# Patient Record
Sex: Male | Born: 1960 | Race: White | Hispanic: No | Marital: Married | Smoking: Former smoker
Health system: Southern US, Community
[De-identification: ages and names within clinical notes are randomized; demographics above are authoritative.]

## PROBLEM LIST (undated history)

## (undated) ENCOUNTER — Emergency Department: Discharge status: Absent without leave | Payer: Medicaid Other

## (undated) DIAGNOSIS — Z860100 Personal history of colon polyps, unspecified: Secondary | ICD-10-CM

## (undated) DIAGNOSIS — Z803 Family history of malignant neoplasm of breast: Secondary | ICD-10-CM

## (undated) DIAGNOSIS — K219 Gastro-esophageal reflux disease without esophagitis: Secondary | ICD-10-CM

## (undated) DIAGNOSIS — N23 Unspecified renal colic: Secondary | ICD-10-CM

## (undated) DIAGNOSIS — F429 Obsessive-compulsive disorder, unspecified: Secondary | ICD-10-CM

## (undated) DIAGNOSIS — M109 Gout, unspecified: Secondary | ICD-10-CM

## (undated) DIAGNOSIS — N200 Calculus of kidney: Secondary | ICD-10-CM

## (undated) DIAGNOSIS — F1010 Alcohol abuse, uncomplicated: Secondary | ICD-10-CM

## (undated) DIAGNOSIS — J45909 Unspecified asthma, uncomplicated: Secondary | ICD-10-CM

## (undated) DIAGNOSIS — I10 Essential (primary) hypertension: Secondary | ICD-10-CM

## (undated) DIAGNOSIS — F419 Anxiety disorder, unspecified: Secondary | ICD-10-CM

## (undated) DIAGNOSIS — Z1503 Genetic susceptibility to malignant neoplasm of prostate: Secondary | ICD-10-CM

## (undated) DIAGNOSIS — Z1501 Genetic susceptibility to malignant neoplasm of breast: Secondary | ICD-10-CM

## (undated) DIAGNOSIS — Z1509 Genetic susceptibility to other malignant neoplasm: Secondary | ICD-10-CM

## (undated) HISTORY — DX: Genetic susceptibility to other malignant neoplasm: Z15.09

## (undated) HISTORY — DX: Genetic susceptibility to malignant neoplasm of breast: Z15.03

## (undated) HISTORY — DX: Family history of malignant neoplasm of breast: Z80.3

## (undated) HISTORY — PX: COLONOSCOPY: SHX174

## (undated) HISTORY — PX: UPPER GI ENDOSCOPY: SHX6162

## (undated) HISTORY — DX: Genetic susceptibility to malignant neoplasm of breast: Z15.01

## (undated) HISTORY — DX: Personal history of colonic polyps: Z86.010

## (undated) HISTORY — DX: Personal history of colon polyps, unspecified: Z86.0100

## (undated) HISTORY — DX: Gastro-esophageal reflux disease without esophagitis: K21.9

---

## 2010-11-16 HISTORY — PX: CHOLECYSTECTOMY: SHX55

## 2012-06-02 ENCOUNTER — Emergency Department: Payer: Self-pay | Admitting: Emergency Medicine

## 2012-06-02 LAB — COMPREHENSIVE METABOLIC PANEL
Albumin: 4.2 g/dL (ref 3.4–5.0)
Alkaline Phosphatase: 85 U/L (ref 50–136)
Anion Gap: 12 (ref 7–16)
BUN: 15 mg/dL (ref 7–18)
Bilirubin,Total: 0.3 mg/dL (ref 0.2–1.0)
Calcium, Total: 9.1 mg/dL (ref 8.5–10.1)
Chloride: 108 mmol/L — ABNORMAL HIGH (ref 98–107)
Co2: 25 mmol/L (ref 21–32)
Creatinine: 1 mg/dL (ref 0.60–1.30)
EGFR (African American): >60
EGFR (Non-African Amer.): >60
Glucose: 129 mg/dL — ABNORMAL HIGH (ref 65–99)
Osmolality: 291 (ref 275–301)
Potassium: 3.2 mmol/L — ABNORMAL LOW (ref 3.5–5.1)
SGOT(AST): 13 U/L — ABNORMAL LOW (ref 15–37)
SGPT (ALT): 21 U/L
Sodium: 145 mmol/L (ref 136–145)
Total Protein: 6.8 g/dL (ref 6.4–8.2)

## 2012-06-02 LAB — URINALYSIS, COMPLETE
Bacteria: NONE SEEN
Bilirubin,UR: NEGATIVE
Blood: NEGATIVE
Glucose,UR: NEGATIVE mg/dL (ref 0–75)
Ketone: NEGATIVE
Leukocyte Esterase: NEGATIVE
Nitrite: NEGATIVE
Ph: 6 (ref 4.5–8.0)
Protein: NEGATIVE
RBC,UR: NONE SEEN /HPF (ref 0–5)
Specific Gravity: 1.005 (ref 1.003–1.030)
Squamous Epithelial: <1
WBC UR: 1 /HPF (ref 0–5)

## 2012-06-02 LAB — CBC
HCT: 41.1 % (ref 40.0–52.0)
HGB: 13.3 g/dL (ref 13.0–18.0)
MCH: 29.7 pg (ref 26.0–34.0)
MCHC: 32.3 g/dL (ref 32.0–36.0)
MCV: 92 fL (ref 80–100)
Platelet: 190 10*3/uL (ref 150–440)
RBC: 4.46 10*6/uL (ref 4.40–5.90)
RDW: 13.4 % (ref 11.5–14.5)
WBC: 7.8 10*3/uL (ref 3.8–10.6)

## 2012-06-02 LAB — TROPONIN I: Troponin-I: <0.02 ng/mL

## 2012-06-11 ENCOUNTER — Emergency Department: Payer: Self-pay | Admitting: Emergency Medicine

## 2012-06-24 ENCOUNTER — Emergency Department: Payer: Self-pay | Admitting: *Deleted

## 2012-06-24 LAB — CBC WITH DIFFERENTIAL/PLATELET
Basophil #: 0.1 10*3/uL (ref 0.0–0.1)
Basophil %: 0.7 %
Eosinophil #: 0.2 10*3/uL (ref 0.0–0.7)
Eosinophil %: 2.1 %
HCT: 43.6 % (ref 40.0–52.0)
HGB: 14.5 g/dL (ref 13.0–18.0)
Lymphocyte #: 2.3 10*3/uL (ref 1.0–3.6)
Lymphocyte %: 27 %
MCH: 30 pg (ref 26.0–34.0)
MCHC: 33.2 g/dL (ref 32.0–36.0)
MCV: 90 fL (ref 80–100)
Monocyte #: 1.1 x10 3/mm — ABNORMAL HIGH (ref 0.2–1.0)
Monocyte %: 12.7 %
Neutrophil #: 4.8 10*3/uL (ref 1.4–6.5)
Neutrophil %: 57.5 %
Platelet: 217 10*3/uL (ref 150–440)
RBC: 4.83 10*6/uL (ref 4.40–5.90)
RDW: 13.4 % (ref 11.5–14.5)
WBC: 8.4 10*3/uL (ref 3.8–10.6)

## 2012-06-24 LAB — COMPREHENSIVE METABOLIC PANEL
Albumin: 4.1 g/dL (ref 3.4–5.0)
Alkaline Phosphatase: 91 U/L (ref 50–136)
Anion Gap: 10 (ref 7–16)
BUN: 17 mg/dL (ref 7–18)
Bilirubin,Total: 0.4 mg/dL (ref 0.2–1.0)
Calcium, Total: 9 mg/dL (ref 8.5–10.1)
Chloride: 107 mmol/L (ref 98–107)
Co2: 24 mmol/L (ref 21–32)
Creatinine: 1.1 mg/dL (ref 0.60–1.30)
EGFR (African American): >60
EGFR (Non-African Amer.): >60
Glucose: 108 mg/dL — ABNORMAL HIGH (ref 65–99)
Osmolality: 283 (ref 275–301)
Potassium: 4.2 mmol/L (ref 3.5–5.1)
SGOT(AST): 20 U/L (ref 15–37)
SGPT (ALT): 29 U/L (ref 12–78)
Sodium: 141 mmol/L (ref 136–145)
Total Protein: 7.2 g/dL (ref 6.4–8.2)

## 2012-06-24 LAB — CK TOTAL AND CKMB (NOT AT ARMC)
CK, Total: 43 U/L (ref 35–232)
CK-MB: 1.3 ng/mL (ref 0.5–3.6)

## 2012-06-24 LAB — TROPONIN I: Troponin-I: <0.02 ng/mL

## 2012-06-28 ENCOUNTER — Emergency Department: Payer: Self-pay | Admitting: Emergency Medicine

## 2012-06-28 LAB — URINALYSIS, COMPLETE
Bacteria: NONE SEEN
Bilirubin,UR: NEGATIVE
Blood: NEGATIVE
Glucose,UR: NEGATIVE mg/dL (ref 0–75)
Nitrite: NEGATIVE
Ph: 5 (ref 4.5–8.0)
Protein: NEGATIVE
RBC,UR: 1 /HPF (ref 0–5)
Specific Gravity: 1.027 (ref 1.003–1.030)
Squamous Epithelial: 1
WBC UR: 6 /HPF (ref 0–5)

## 2012-08-20 ENCOUNTER — Emergency Department: Payer: Self-pay | Admitting: Emergency Medicine

## 2012-08-20 LAB — CBC
HCT: 46.6 % (ref 40.0–52.0)
HGB: 15.4 g/dL (ref 13.0–18.0)
MCH: 29.9 pg (ref 26.0–34.0)
MCHC: 33 g/dL (ref 32.0–36.0)
MCV: 91 fL (ref 80–100)
Platelet: 237 10*3/uL (ref 150–440)
RBC: 5.15 10*6/uL (ref 4.40–5.90)
RDW: 13.7 % (ref 11.5–14.5)
WBC: 8.2 10*3/uL (ref 3.8–10.6)

## 2012-08-20 LAB — COMPREHENSIVE METABOLIC PANEL
Albumin: 4.3 g/dL (ref 3.4–5.0)
Alkaline Phosphatase: 106 U/L (ref 50–136)
Anion Gap: 12 (ref 7–16)
BUN: 15 mg/dL (ref 7–18)
Bilirubin,Total: 0.8 mg/dL (ref 0.2–1.0)
Calcium, Total: 9.1 mg/dL (ref 8.5–10.1)
Chloride: 103 mmol/L (ref 98–107)
Co2: 25 mmol/L (ref 21–32)
Creatinine: 1.05 mg/dL (ref 0.60–1.30)
EGFR (African American): >60
EGFR (Non-African Amer.): >60
Glucose: 96 mg/dL (ref 65–99)
Osmolality: 280 (ref 275–301)
Potassium: 4.7 mmol/L (ref 3.5–5.1)
SGOT(AST): 15 U/L (ref 15–37)
SGPT (ALT): 28 U/L (ref 12–78)
Sodium: 140 mmol/L (ref 136–145)
Total Protein: 8 g/dL (ref 6.4–8.2)

## 2012-08-20 LAB — TROPONIN I: Troponin-I: <0.02 ng/mL

## 2012-10-21 ENCOUNTER — Observation Stay: Payer: Self-pay | Admitting: Surgery

## 2012-10-21 LAB — COMPREHENSIVE METABOLIC PANEL
Albumin: 4.3 g/dL (ref 3.4–5.0)
Alkaline Phosphatase: 109 U/L (ref 50–136)
Anion Gap: 8 (ref 7–16)
BUN: 16 mg/dL (ref 7–18)
Bilirubin,Total: 0.4 mg/dL (ref 0.2–1.0)
Calcium, Total: 9 mg/dL (ref 8.5–10.1)
Chloride: 107 mmol/L (ref 98–107)
Co2: 24 mmol/L (ref 21–32)
Creatinine: 1.08 mg/dL (ref 0.60–1.30)
EGFR (African American): >60
EGFR (Non-African Amer.): >60
Glucose: 110 mg/dL — ABNORMAL HIGH (ref 65–99)
Osmolality: 279 (ref 275–301)
Potassium: 4.3 mmol/L (ref 3.5–5.1)
SGOT(AST): 20 U/L (ref 15–37)
SGPT (ALT): 31 U/L (ref 12–78)
Sodium: 139 mmol/L (ref 136–145)
Total Protein: 7.6 g/dL (ref 6.4–8.2)

## 2012-10-21 LAB — URINALYSIS, COMPLETE
Bacteria: NONE SEEN
Bilirubin,UR: NEGATIVE
Blood: NEGATIVE
Glucose,UR: NEGATIVE mg/dL (ref 0–75)
Ketone: NEGATIVE
Nitrite: NEGATIVE
Ph: 5 (ref 4.5–8.0)
Protein: NEGATIVE
RBC,UR: 1 /HPF (ref 0–5)
Specific Gravity: 1.019 (ref 1.003–1.030)
Squamous Epithelial: <1
WBC UR: 6 /HPF (ref 0–5)

## 2012-10-21 LAB — CBC
HCT: 43 % (ref 40.0–52.0)
HGB: 14.7 g/dL (ref 13.0–18.0)
MCH: 30.9 pg (ref 26.0–34.0)
MCHC: 34.1 g/dL (ref 32.0–36.0)
MCV: 91 fL (ref 80–100)
Platelet: 213 10*3/uL (ref 150–440)
RBC: 4.74 10*6/uL (ref 4.40–5.90)
RDW: 13.5 % (ref 11.5–14.5)
WBC: 8.1 10*3/uL (ref 3.8–10.6)

## 2012-10-21 LAB — LIPASE, BLOOD: Lipase: 139 U/L (ref 73–393)

## 2012-10-22 LAB — CBC WITH DIFFERENTIAL/PLATELET
Basophil #: 0 10*3/uL (ref 0.0–0.1)
Basophil %: 0.4 %
Eosinophil #: 0.1 10*3/uL (ref 0.0–0.7)
Eosinophil %: 1.5 %
HCT: 39.7 % — ABNORMAL LOW (ref 40.0–52.0)
HGB: 13.8 g/dL (ref 13.0–18.0)
Lymphocyte #: 1.8 10*3/uL (ref 1.0–3.6)
Lymphocyte %: 19.4 %
MCH: 31.7 pg (ref 26.0–34.0)
MCHC: 34.7 g/dL (ref 32.0–36.0)
MCV: 92 fL (ref 80–100)
Monocyte #: 1 x10 3/mm (ref 0.2–1.0)
Monocyte %: 10.6 %
Neutrophil #: 6.4 10*3/uL (ref 1.4–6.5)
Neutrophil %: 68.1 %
Platelet: 192 10*3/uL (ref 150–440)
RBC: 4.34 10*6/uL — ABNORMAL LOW (ref 4.40–5.90)
RDW: 13.5 % (ref 11.5–14.5)
WBC: 9.4 10*3/uL (ref 3.8–10.6)

## 2012-10-22 LAB — BASIC METABOLIC PANEL
Anion Gap: 6 — ABNORMAL LOW (ref 7–16)
BUN: 16 mg/dL (ref 7–18)
Calcium, Total: 8.7 mg/dL (ref 8.5–10.1)
Chloride: 108 mmol/L — ABNORMAL HIGH (ref 98–107)
Co2: 28 mmol/L (ref 21–32)
Creatinine: 0.99 mg/dL (ref 0.60–1.30)
EGFR (African American): >60
EGFR (Non-African Amer.): >60
Glucose: 107 mg/dL — ABNORMAL HIGH (ref 65–99)
Osmolality: 285 (ref 275–301)
Potassium: 4.2 mmol/L (ref 3.5–5.1)
Sodium: 142 mmol/L (ref 136–145)

## 2012-10-23 LAB — CBC WITH DIFFERENTIAL/PLATELET
Basophil #: 0 10*3/uL (ref 0.0–0.1)
Basophil %: 0.6 %
Eosinophil #: 0.2 10*3/uL (ref 0.0–0.7)
Eosinophil %: 3 %
HCT: 37.7 % — ABNORMAL LOW (ref 40.0–52.0)
HGB: 12.6 g/dL — ABNORMAL LOW (ref 13.0–18.0)
Lymphocyte #: 1.7 10*3/uL (ref 1.0–3.6)
Lymphocyte %: 28.7 %
MCH: 30.6 pg (ref 26.0–34.0)
MCHC: 33.4 g/dL (ref 32.0–36.0)
MCV: 92 fL (ref 80–100)
Monocyte #: 0.7 x10 3/mm (ref 0.2–1.0)
Monocyte %: 12.7 %
Neutrophil #: 3.2 10*3/uL (ref 1.4–6.5)
Neutrophil %: 55 %
Platelet: 171 10*3/uL (ref 150–440)
RBC: 4.1 10*6/uL — ABNORMAL LOW (ref 4.40–5.90)
RDW: 13.5 % (ref 11.5–14.5)
WBC: 5.8 10*3/uL (ref 3.8–10.6)

## 2012-10-23 LAB — BASIC METABOLIC PANEL
Anion Gap: 5 — ABNORMAL LOW (ref 7–16)
BUN: 10 mg/dL (ref 7–18)
Calcium, Total: 8.3 mg/dL — ABNORMAL LOW (ref 8.5–10.1)
Chloride: 108 mmol/L — ABNORMAL HIGH (ref 98–107)
Co2: 30 mmol/L (ref 21–32)
Creatinine: 1.08 mg/dL (ref 0.60–1.30)
EGFR (African American): >60
EGFR (Non-African Amer.): >60
Glucose: 98 mg/dL (ref 65–99)
Osmolality: 284 (ref 275–301)
Potassium: 4 mmol/L (ref 3.5–5.1)
Sodium: 143 mmol/L (ref 136–145)

## 2012-12-13 ENCOUNTER — Emergency Department: Payer: Self-pay | Admitting: Emergency Medicine

## 2012-12-26 ENCOUNTER — Ambulatory Visit: Payer: Self-pay | Admitting: Family Medicine

## 2013-03-03 IMAGING — CR DG ABDOMEN 1V
1 series · 2 of 2 positions shown · non-contrast
Comparison: none

REASON FOR EXAM: abd pain
COMMENTS:

PROCEDURE:     DXR - DXR KIDNEY URETER BLADDER  - June 02, 2012  [DATE]
RESULT:     The bowel gas pattern is within the limits of normal. No
abnormal calcifications are present over the urinary tracts. The bony
structures exhibit no acute abnormality.

[Series 3: t abdomen supine · 0.14mm/px · 2 of 2 slices shown]
[im 1/2]
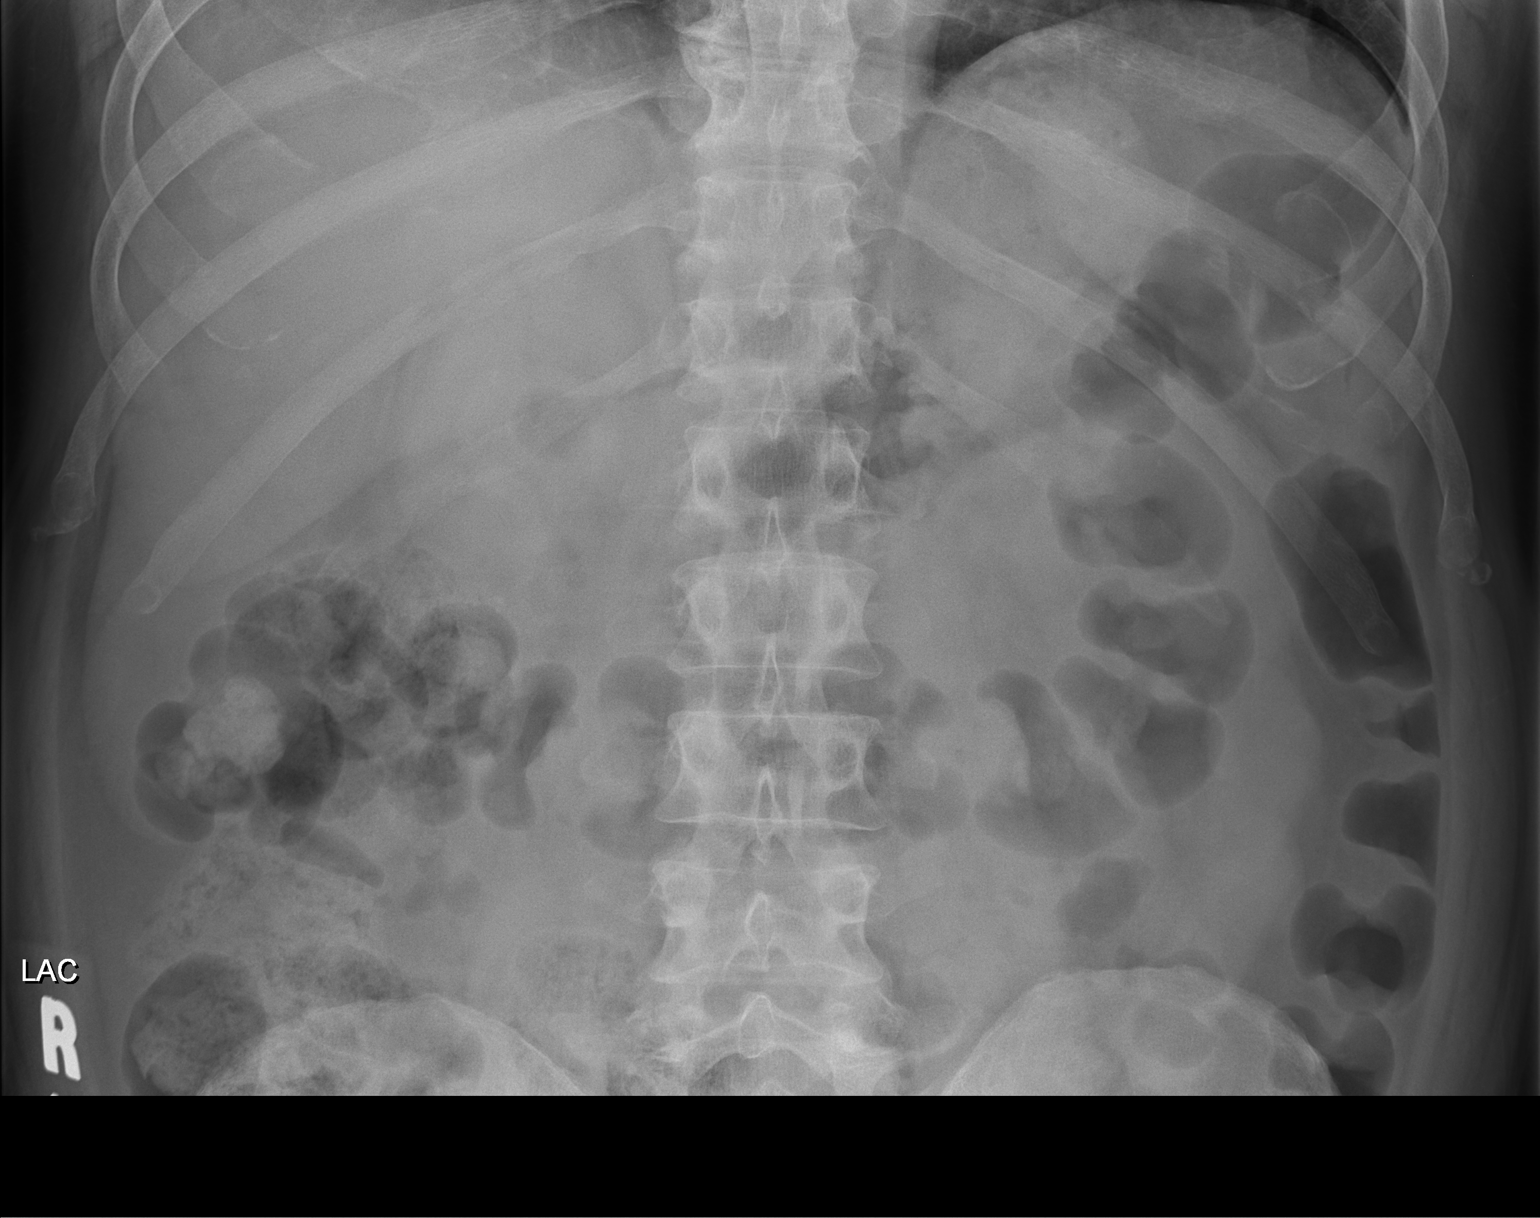
[im 2/2]
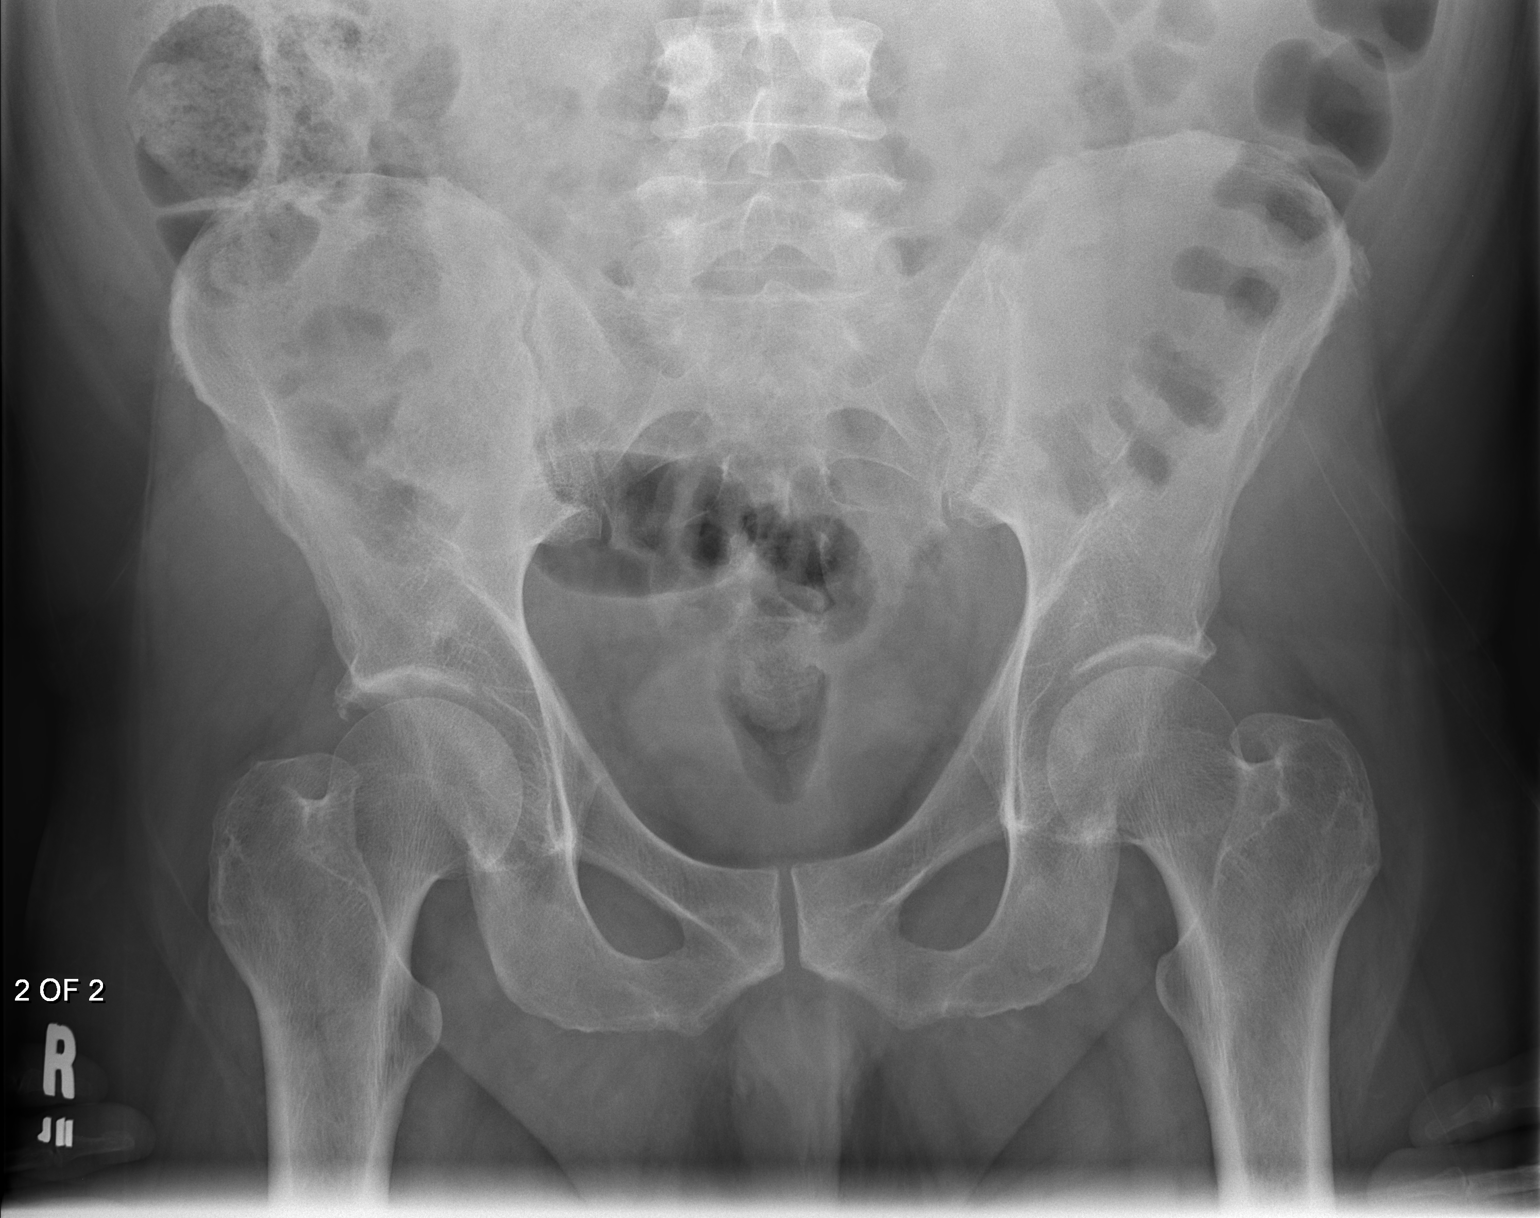

[2 of 2 positions shown; findings below may reference images not displayed]

IMPRESSION: No acute intra-abdominal or pelvic abnormality is
demonstrated.

## 2013-03-03 IMAGING — CR DG CHEST 2V
1 series · 2 of 2 positions shown · non-contrast
Comparison: none

REASON FOR EXAM: shortness of breath
COMMENTS:

[Series 1: w chest pa · 0.14mm/px · 2 of 2 slices shown]
[im 1/2]
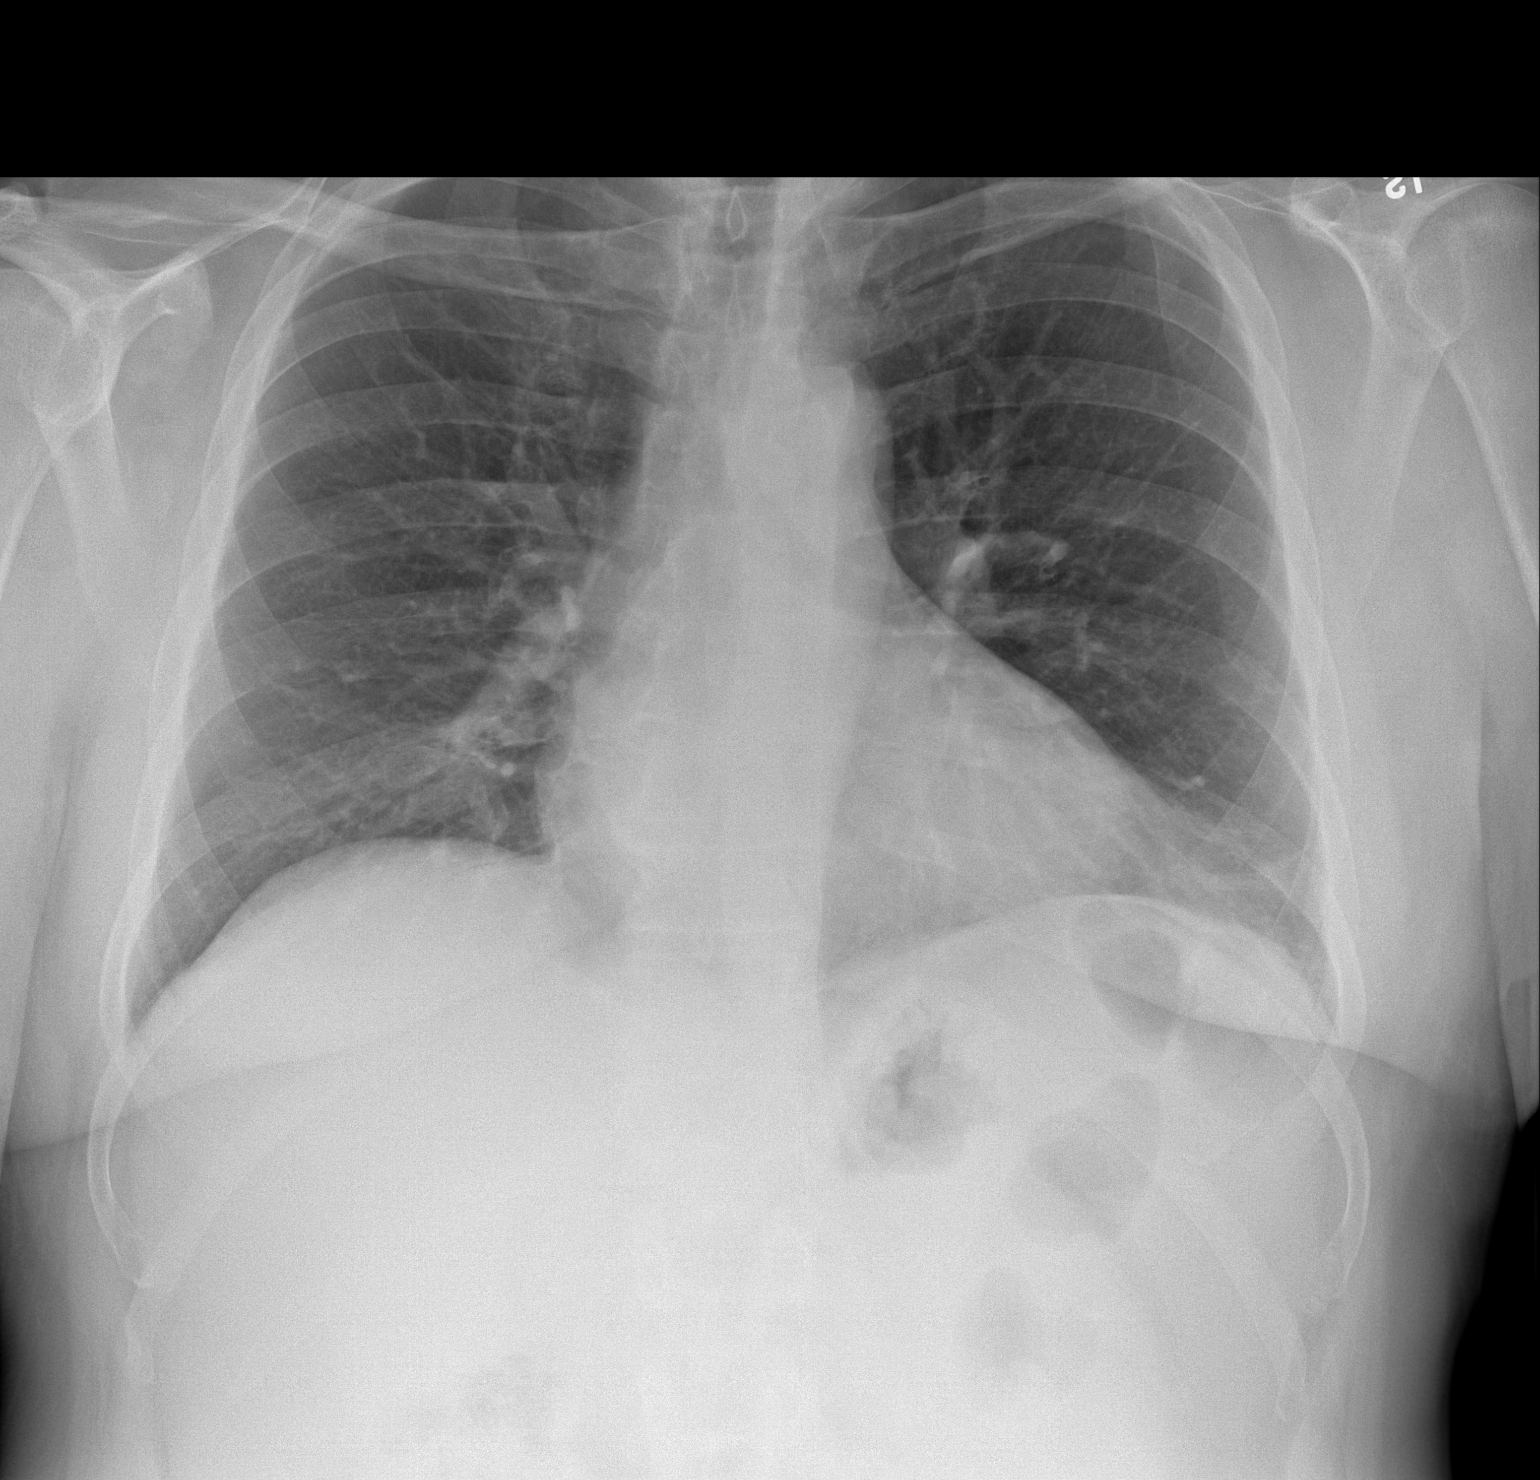
[im 2/2]
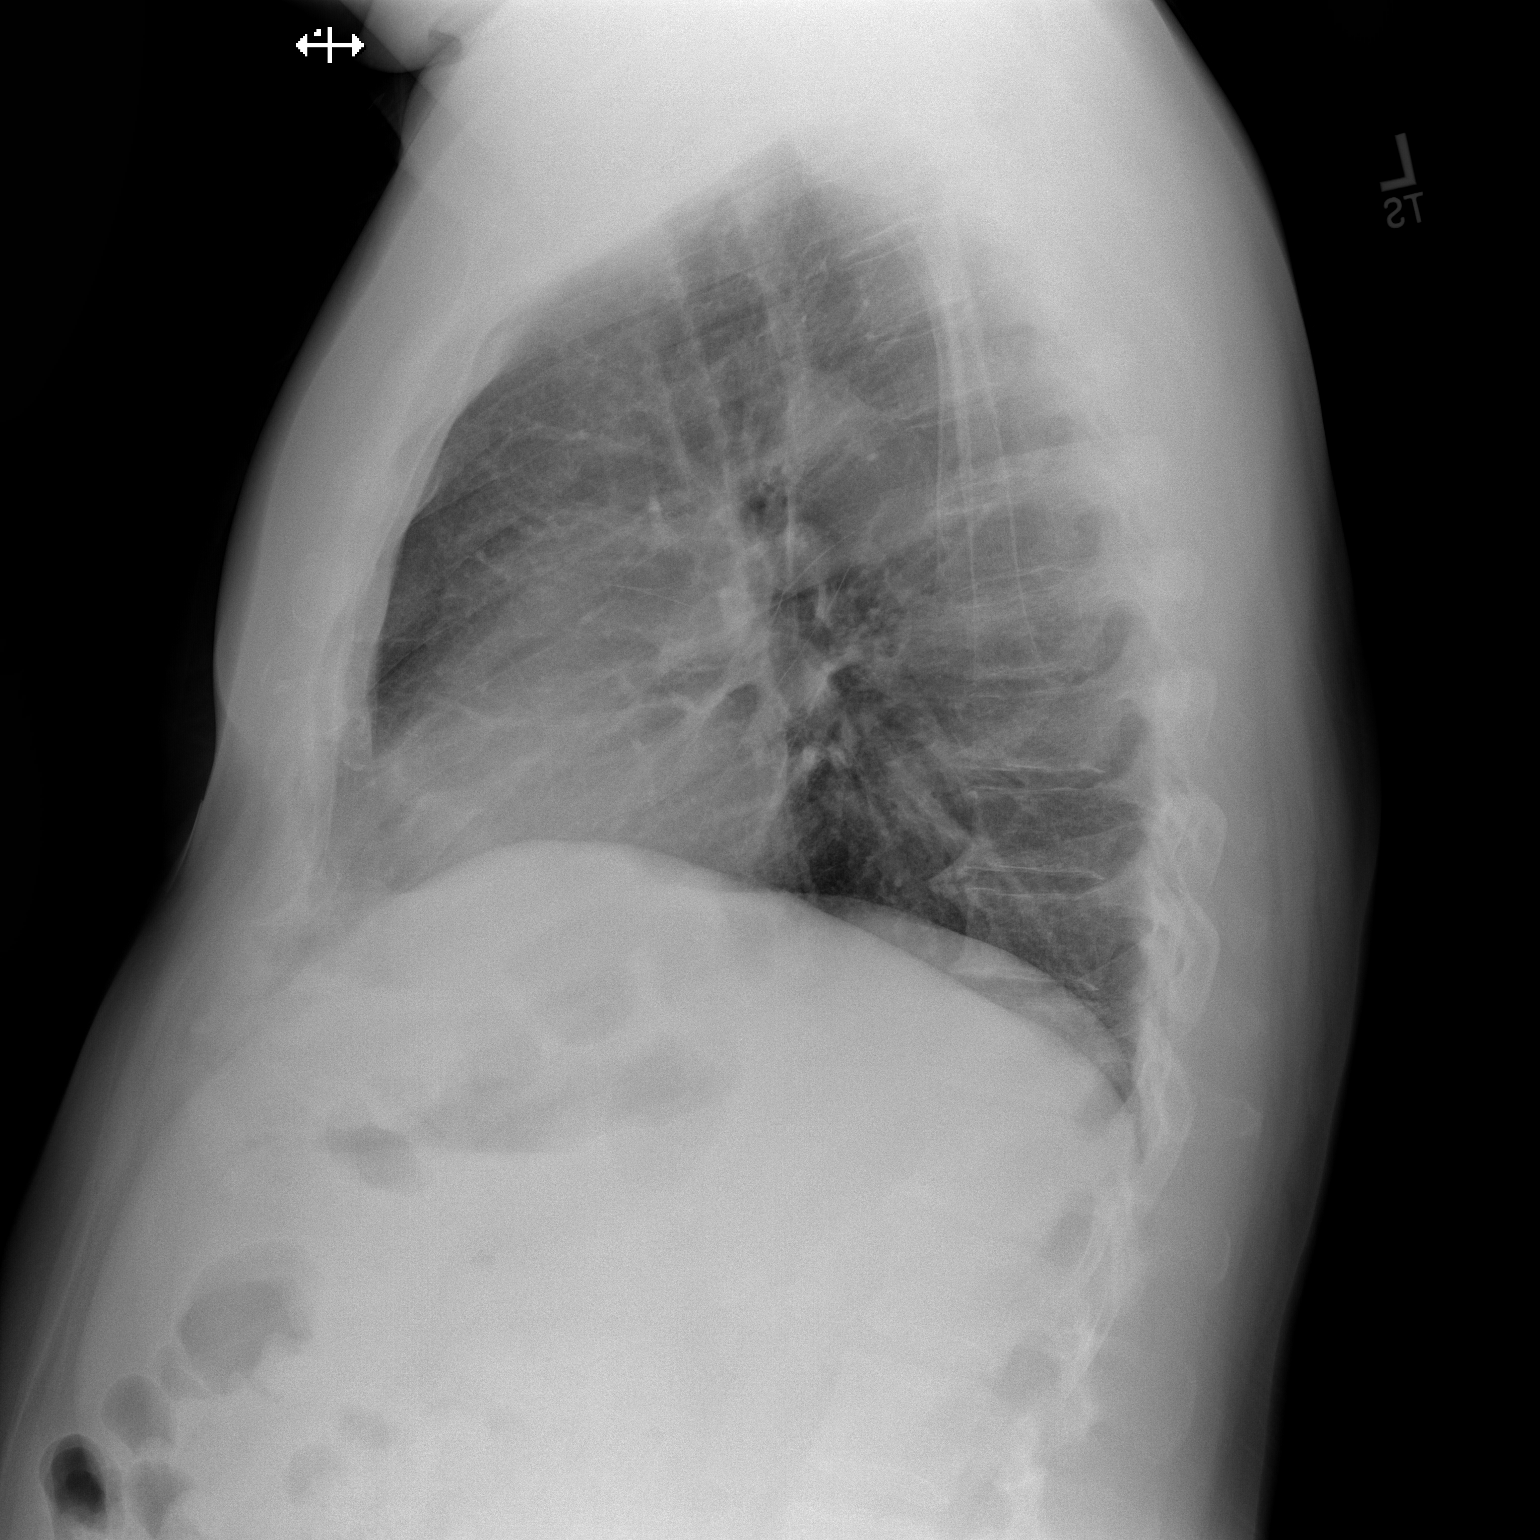

[2 of 2 positions shown; findings below may reference images not displayed]

PROCEDURE:     DXR - DXR CHEST PA (OR AP) AND LATERAL  - June 02, 2012  [DATE]

RESULT:     The lungs are reasonably well inflated. There is subsegmental
atelectasis at the left lung base laterally. The cardiac silhouette is top
normal in size. The pulmonary vascularity is not engorged. There is no
pleural effusion. The mediastinum is normal in width. There is no
pneumothorax nor pneumomediastinum. The bony thorax exhibits no acute
abnormality.
IMPRESSION: There is very mild subsegmental atelectasis at the left
lung base. A followup PA and lateral chest x-ray with deep inspiration may
be useful.

## 2013-03-12 ENCOUNTER — Emergency Department: Payer: Self-pay | Admitting: Internal Medicine

## 2013-03-12 LAB — URINALYSIS, COMPLETE
Bilirubin,UR: NEGATIVE
Blood: NEGATIVE
Glucose,UR: NEGATIVE mg/dL (ref 0–75)
Ketone: NEGATIVE
Nitrite: NEGATIVE
Ph: 5 (ref 4.5–8.0)
Protein: 30
RBC,UR: 1 /HPF (ref 0–5)
Specific Gravity: 1.026 (ref 1.003–1.030)
Squamous Epithelial: 3
WBC UR: 20 /HPF (ref 0–5)

## 2013-03-12 LAB — COMPREHENSIVE METABOLIC PANEL
Albumin: 4.3 g/dL (ref 3.4–5.0)
Alkaline Phosphatase: 91 U/L (ref 50–136)
Anion Gap: 11 (ref 7–16)
BUN: 18 mg/dL (ref 7–18)
Bilirubin,Total: 1 mg/dL (ref 0.2–1.0)
Calcium, Total: 9.7 mg/dL (ref 8.5–10.1)
Chloride: 105 mmol/L (ref 98–107)
Co2: 23 mmol/L (ref 21–32)
Creatinine: 1.18 mg/dL (ref 0.60–1.30)
EGFR (African American): >60
EGFR (Non-African Amer.): >60
Glucose: 150 mg/dL — ABNORMAL HIGH (ref 65–99)
Osmolality: 282 (ref 275–301)
Potassium: 3.8 mmol/L (ref 3.5–5.1)
SGOT(AST): 22 U/L (ref 15–37)
SGPT (ALT): 30 U/L (ref 12–78)
Sodium: 139 mmol/L (ref 136–145)
Total Protein: 8.2 g/dL (ref 6.4–8.2)

## 2013-03-12 LAB — CBC
HCT: 46.1 % (ref 40.0–52.0)
HGB: 15.2 g/dL (ref 13.0–18.0)
MCH: 29.8 pg (ref 26.0–34.0)
MCHC: 32.9 g/dL (ref 32.0–36.0)
MCV: 91 fL (ref 80–100)
Platelet: 228 10*3/uL (ref 150–440)
RBC: 5.09 10*6/uL (ref 4.40–5.90)
RDW: 12.9 % (ref 11.5–14.5)
WBC: 6.1 10*3/uL (ref 3.8–10.6)

## 2013-03-12 LAB — DRUG SCREEN, URINE

## 2013-03-12 LAB — TROPONIN I: Troponin-I: <0.02 ng/mL

## 2013-03-12 LAB — CK TOTAL AND CKMB (NOT AT ARMC)
CK, Total: 35 U/L (ref 35–232)
CK-MB: <0.5 ng/mL — ABNORMAL LOW (ref 0.5–3.6)

## 2013-03-14 ENCOUNTER — Emergency Department: Payer: Self-pay | Admitting: Emergency Medicine

## 2013-03-15 LAB — CBC
HCT: 38.5 % — ABNORMAL LOW (ref 40.0–52.0)
HGB: 13.3 g/dL (ref 13.0–18.0)
MCH: 31.1 pg (ref 26.0–34.0)
MCHC: 34.6 g/dL (ref 32.0–36.0)
MCV: 90 fL (ref 80–100)
Platelet: 223 10*3/uL (ref 150–440)
RBC: 4.28 10*6/uL — ABNORMAL LOW (ref 4.40–5.90)
RDW: 12.9 % (ref 11.5–14.5)
WBC: 5.7 10*3/uL (ref 3.8–10.6)

## 2013-03-15 LAB — COMPREHENSIVE METABOLIC PANEL
Albumin: 3.6 g/dL (ref 3.4–5.0)
Alkaline Phosphatase: 83 U/L (ref 50–136)
Anion Gap: 8 (ref 7–16)
BUN: 20 mg/dL — ABNORMAL HIGH (ref 7–18)
Bilirubin,Total: 0.2 mg/dL (ref 0.2–1.0)
Calcium, Total: 8.3 mg/dL — ABNORMAL LOW (ref 8.5–10.1)
Chloride: 108 mmol/L — ABNORMAL HIGH (ref 98–107)
Co2: 26 mmol/L (ref 21–32)
Creatinine: 1.16 mg/dL (ref 0.60–1.30)
EGFR (African American): >60
EGFR (Non-African Amer.): >60
Glucose: 150 mg/dL — ABNORMAL HIGH (ref 65–99)
Osmolality: 289 (ref 275–301)
Potassium: 4.6 mmol/L (ref 3.5–5.1)
SGOT(AST): 21 U/L (ref 15–37)
SGPT (ALT): 35 U/L (ref 12–78)
Sodium: 142 mmol/L (ref 136–145)
Total Protein: 7 g/dL (ref 6.4–8.2)

## 2013-03-15 LAB — URINALYSIS, COMPLETE
Bacteria: NONE SEEN
Bilirubin,UR: NEGATIVE
Blood: NEGATIVE
Glucose,UR: NEGATIVE mg/dL (ref 0–75)
Leukocyte Esterase: NEGATIVE
Nitrite: NEGATIVE
Ph: 6 (ref 4.5–8.0)
Protein: NEGATIVE
RBC,UR: 1 /HPF (ref 0–5)
Specific Gravity: 1.015 (ref 1.003–1.030)
Squamous Epithelial: 2
WBC UR: 3 /HPF (ref 0–5)

## 2013-03-15 LAB — TROPONIN I: Troponin-I: <0.02 ng/mL

## 2013-03-18 LAB — CULTURE, BLOOD (SINGLE)

## 2013-03-23 ENCOUNTER — Emergency Department: Payer: Self-pay | Admitting: Unknown Physician Specialty

## 2013-03-23 LAB — CBC
HCT: 44 % (ref 40.0–52.0)
HGB: 14.5 g/dL (ref 13.0–18.0)
MCH: 29.3 pg (ref 26.0–34.0)
MCHC: 32.9 g/dL (ref 32.0–36.0)
MCV: 89 fL (ref 80–100)
Platelet: 250 10*3/uL (ref 150–440)
RBC: 4.95 10*6/uL (ref 4.40–5.90)
RDW: 13 % (ref 11.5–14.5)
WBC: 11.1 10*3/uL — ABNORMAL HIGH (ref 3.8–10.6)

## 2013-03-23 LAB — COMPREHENSIVE METABOLIC PANEL
Albumin: 4.3 g/dL (ref 3.4–5.0)
Alkaline Phosphatase: 82 U/L (ref 50–136)
Anion Gap: 9 (ref 7–16)
BUN: 22 mg/dL — ABNORMAL HIGH (ref 7–18)
Bilirubin,Total: 0.7 mg/dL (ref 0.2–1.0)
Calcium, Total: 9.8 mg/dL (ref 8.5–10.1)
Chloride: 106 mmol/L (ref 98–107)
Co2: 22 mmol/L (ref 21–32)
Creatinine: 1.08 mg/dL (ref 0.60–1.30)
EGFR (African American): >60
EGFR (Non-African Amer.): >60
Glucose: 122 mg/dL — ABNORMAL HIGH (ref 65–99)
Osmolality: 278 (ref 275–301)
Potassium: 4.1 mmol/L (ref 3.5–5.1)
SGOT(AST): 18 U/L (ref 15–37)
SGPT (ALT): 29 U/L (ref 12–78)
Sodium: 137 mmol/L (ref 136–145)
Total Protein: 7.8 g/dL (ref 6.4–8.2)

## 2013-03-23 LAB — LIPASE, BLOOD: Lipase: 146 U/L (ref 73–393)

## 2013-03-23 LAB — TROPONIN I
Troponin-I: <0.02 ng/mL
Troponin-I: <0.02 ng/mL

## 2013-03-25 IMAGING — CR DG CHEST 2V
1 series · 2 of 2 positions shown · non-contrast
Comparison: none

REASON FOR EXAM: chest tightness, SOB
COMMENTS:

PROCEDURE:     DXR - DXR CHEST PA (OR AP) AND LATERAL  - June 24, 2012  [DATE]
RESULT:     Comparison is made to a prior study dated 06/02/2012.

[Series 5: w chest pa · 0.14mm/px · 2 of 2 slices shown]
[im 1/2]
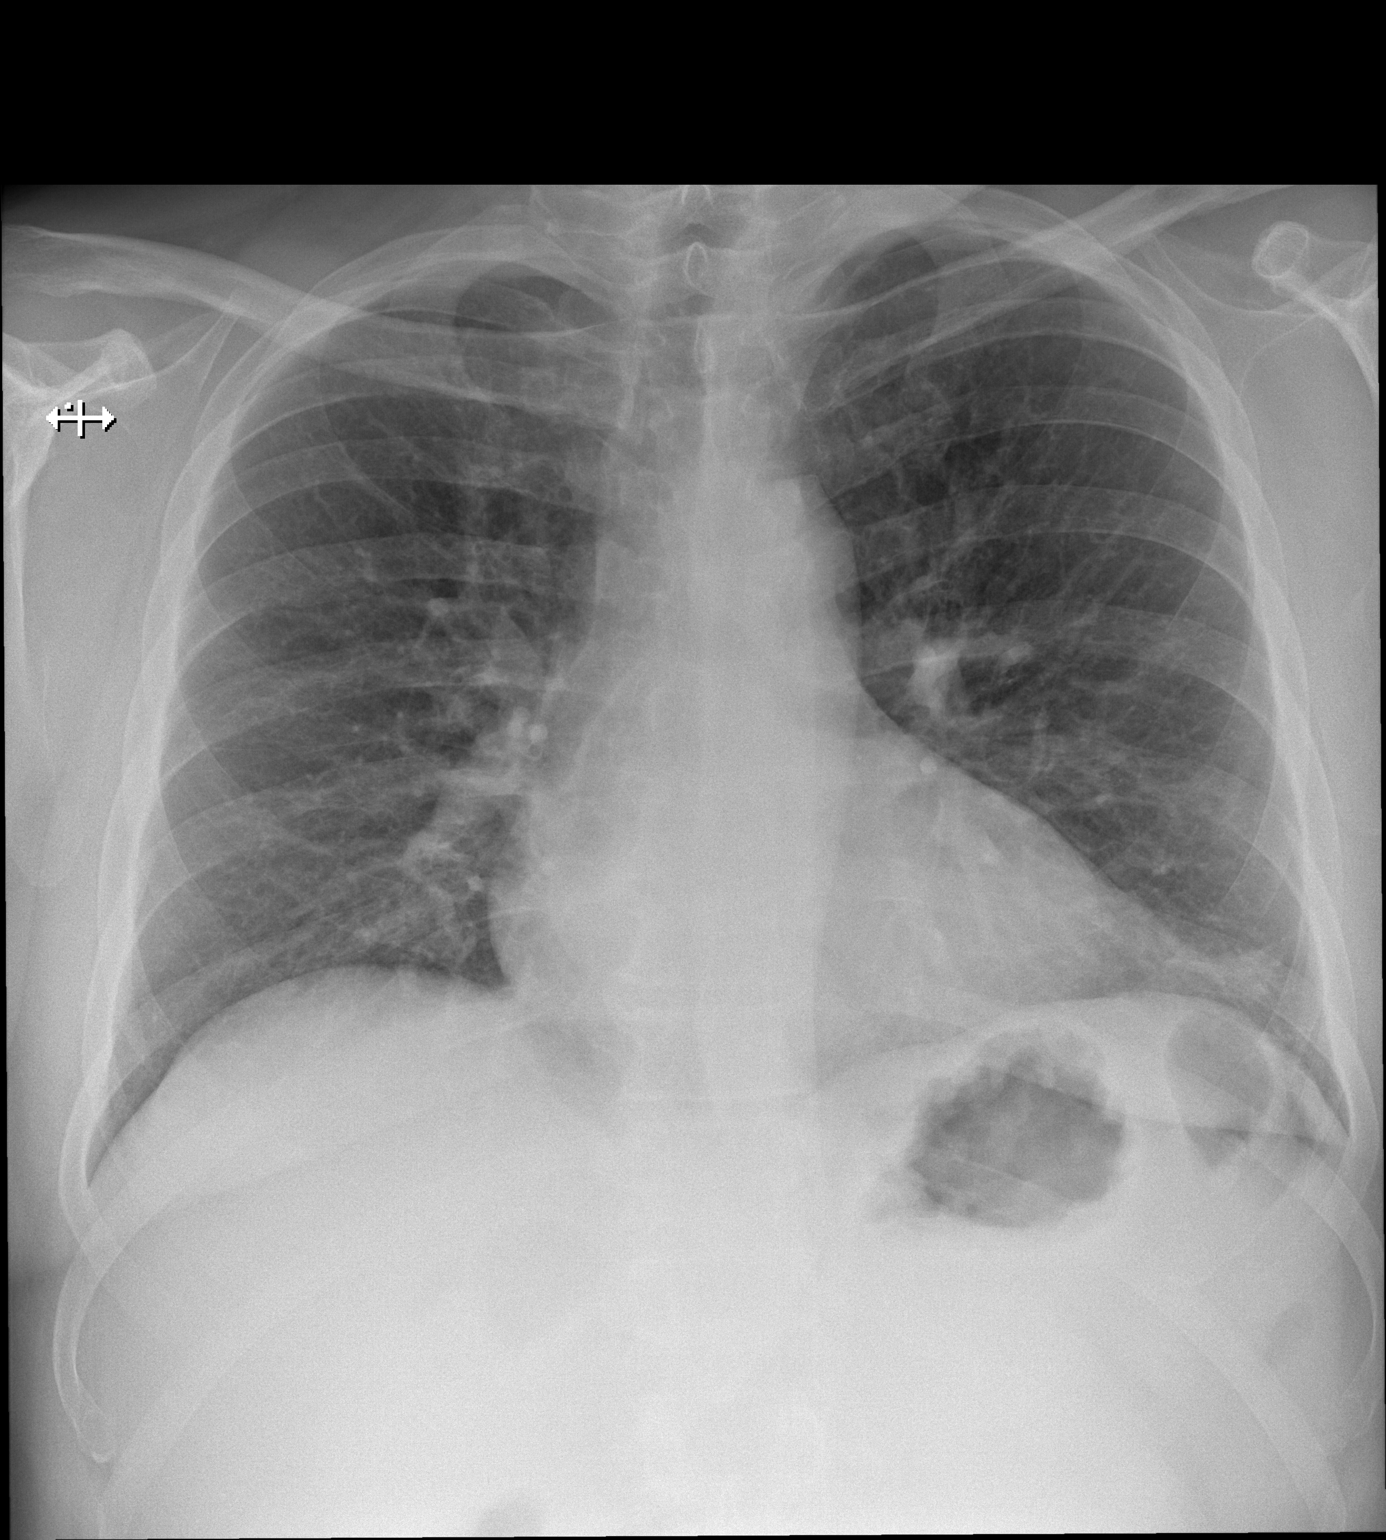
[im 2/2]
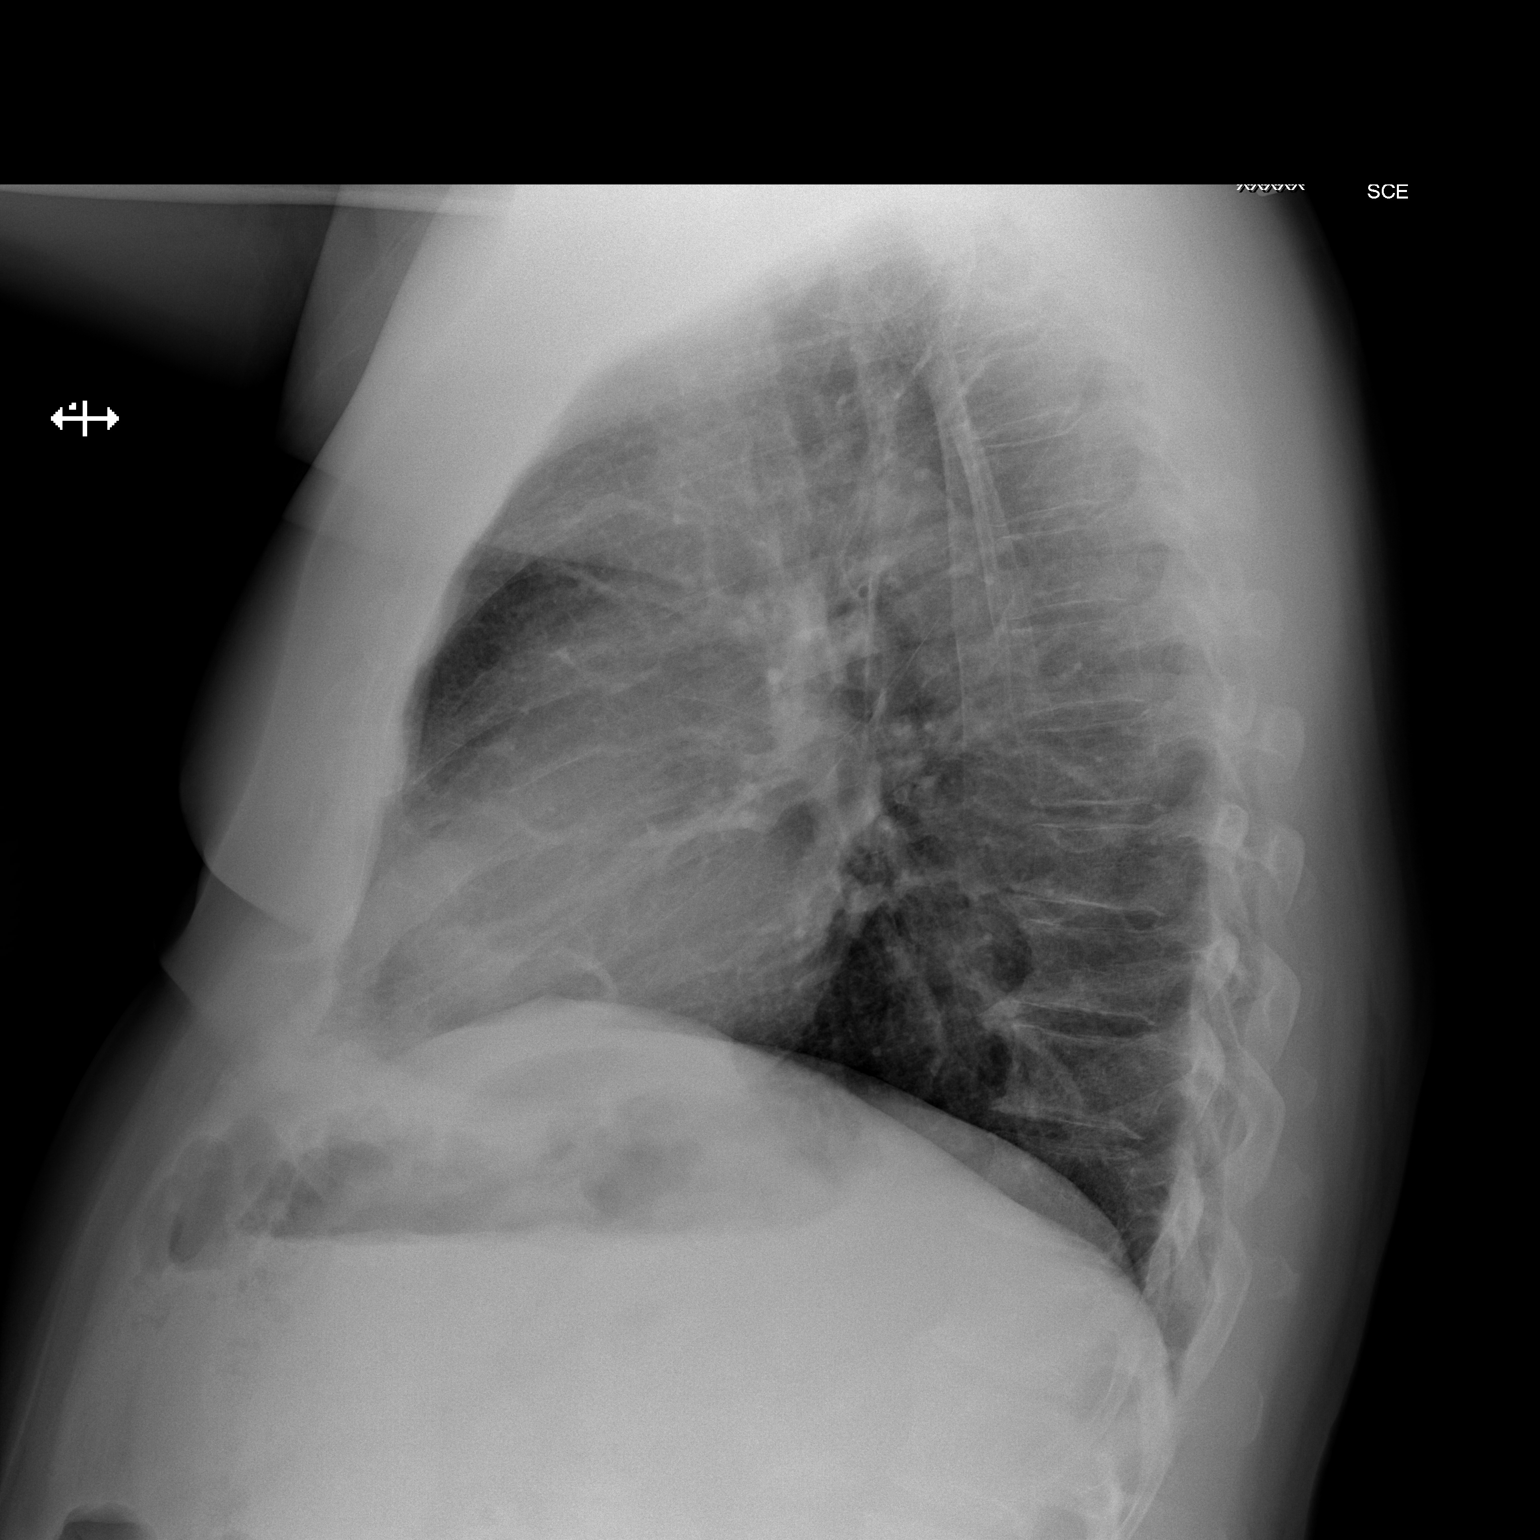

[2 of 2 positions shown; findings below may reference images not displayed]

FINDINGS: The patient has taken a shallow inspiration. There is prominence
of the interstitial markings and mild peribronchial cuffing. No focal
regions of consolidation are identified. A very mild area of increased
density projects in the lingula region. The visualized bony skeleton is
unremarkable.
IMPRESSION: 1.  Shallow inspiration which accentuates the interstitial findings.
Underlying component of pulmonary vasculature congestion cannot be excluded.
2.  Atelectasis versus infiltrate in the region of the lingula, mild.
Surveillance evaluation is recommended, if and as clinically warranted.

## 2013-05-12 ENCOUNTER — Emergency Department: Payer: Self-pay | Admitting: Emergency Medicine

## 2013-05-12 LAB — URINALYSIS, COMPLETE
Bilirubin,UR: NEGATIVE
Blood: NEGATIVE
Glucose,UR: NEGATIVE mg/dL (ref 0–75)
Nitrite: NEGATIVE
Ph: 5 (ref 4.5–8.0)
Protein: NEGATIVE
RBC,UR: 1 /HPF (ref 0–5)
Specific Gravity: 1.024 (ref 1.003–1.030)
Squamous Epithelial: <1
WBC UR: 4 /HPF (ref 0–5)

## 2013-05-12 LAB — CBC
HCT: 43.2 % (ref 40.0–52.0)
HGB: 14.6 g/dL (ref 13.0–18.0)
MCH: 29.8 pg (ref 26.0–34.0)
MCHC: 33.8 g/dL (ref 32.0–36.0)
MCV: 88 fL (ref 80–100)
Platelet: 237 10*3/uL (ref 150–440)
RBC: 4.89 10*6/uL (ref 4.40–5.90)
RDW: 13.9 % (ref 11.5–14.5)
WBC: 8.5 10*3/uL (ref 3.8–10.6)

## 2013-05-12 LAB — TROPONIN I: Troponin-I: <0.02 ng/mL

## 2013-05-12 LAB — BASIC METABOLIC PANEL
Anion Gap: 8 (ref 7–16)
BUN: 16 mg/dL (ref 7–18)
Calcium, Total: 9.2 mg/dL (ref 8.5–10.1)
Chloride: 108 mmol/L — ABNORMAL HIGH (ref 98–107)
Co2: 24 mmol/L (ref 21–32)
Creatinine: 0.96 mg/dL (ref 0.60–1.30)
EGFR (African American): >60
EGFR (Non-African Amer.): >60
Glucose: 96 mg/dL (ref 65–99)
Osmolality: 280 (ref 275–301)
Potassium: 4.1 mmol/L (ref 3.5–5.1)
Sodium: 140 mmol/L (ref 136–145)

## 2013-05-21 IMAGING — CR DG CHEST 2V
1 series · 2 of 2 positions shown · non-contrast
Comparison: none

REASON FOR EXAM: shortness of breath, feels weak
COMMENTS:

PROCEDURE:     DXR - DXR CHEST PA (OR AP) AND LATERAL  - August 20, 2012  [DATE]
RESULT:     Mild atelectasis versus scarring left lung base. No change from
prior exam of 06/24/2012. Lungs clear. Cardiac structures unremarkable.

[Series 1: w chest pa · 0.14mm/px · 2 of 2 slices shown]
[im 1/2]
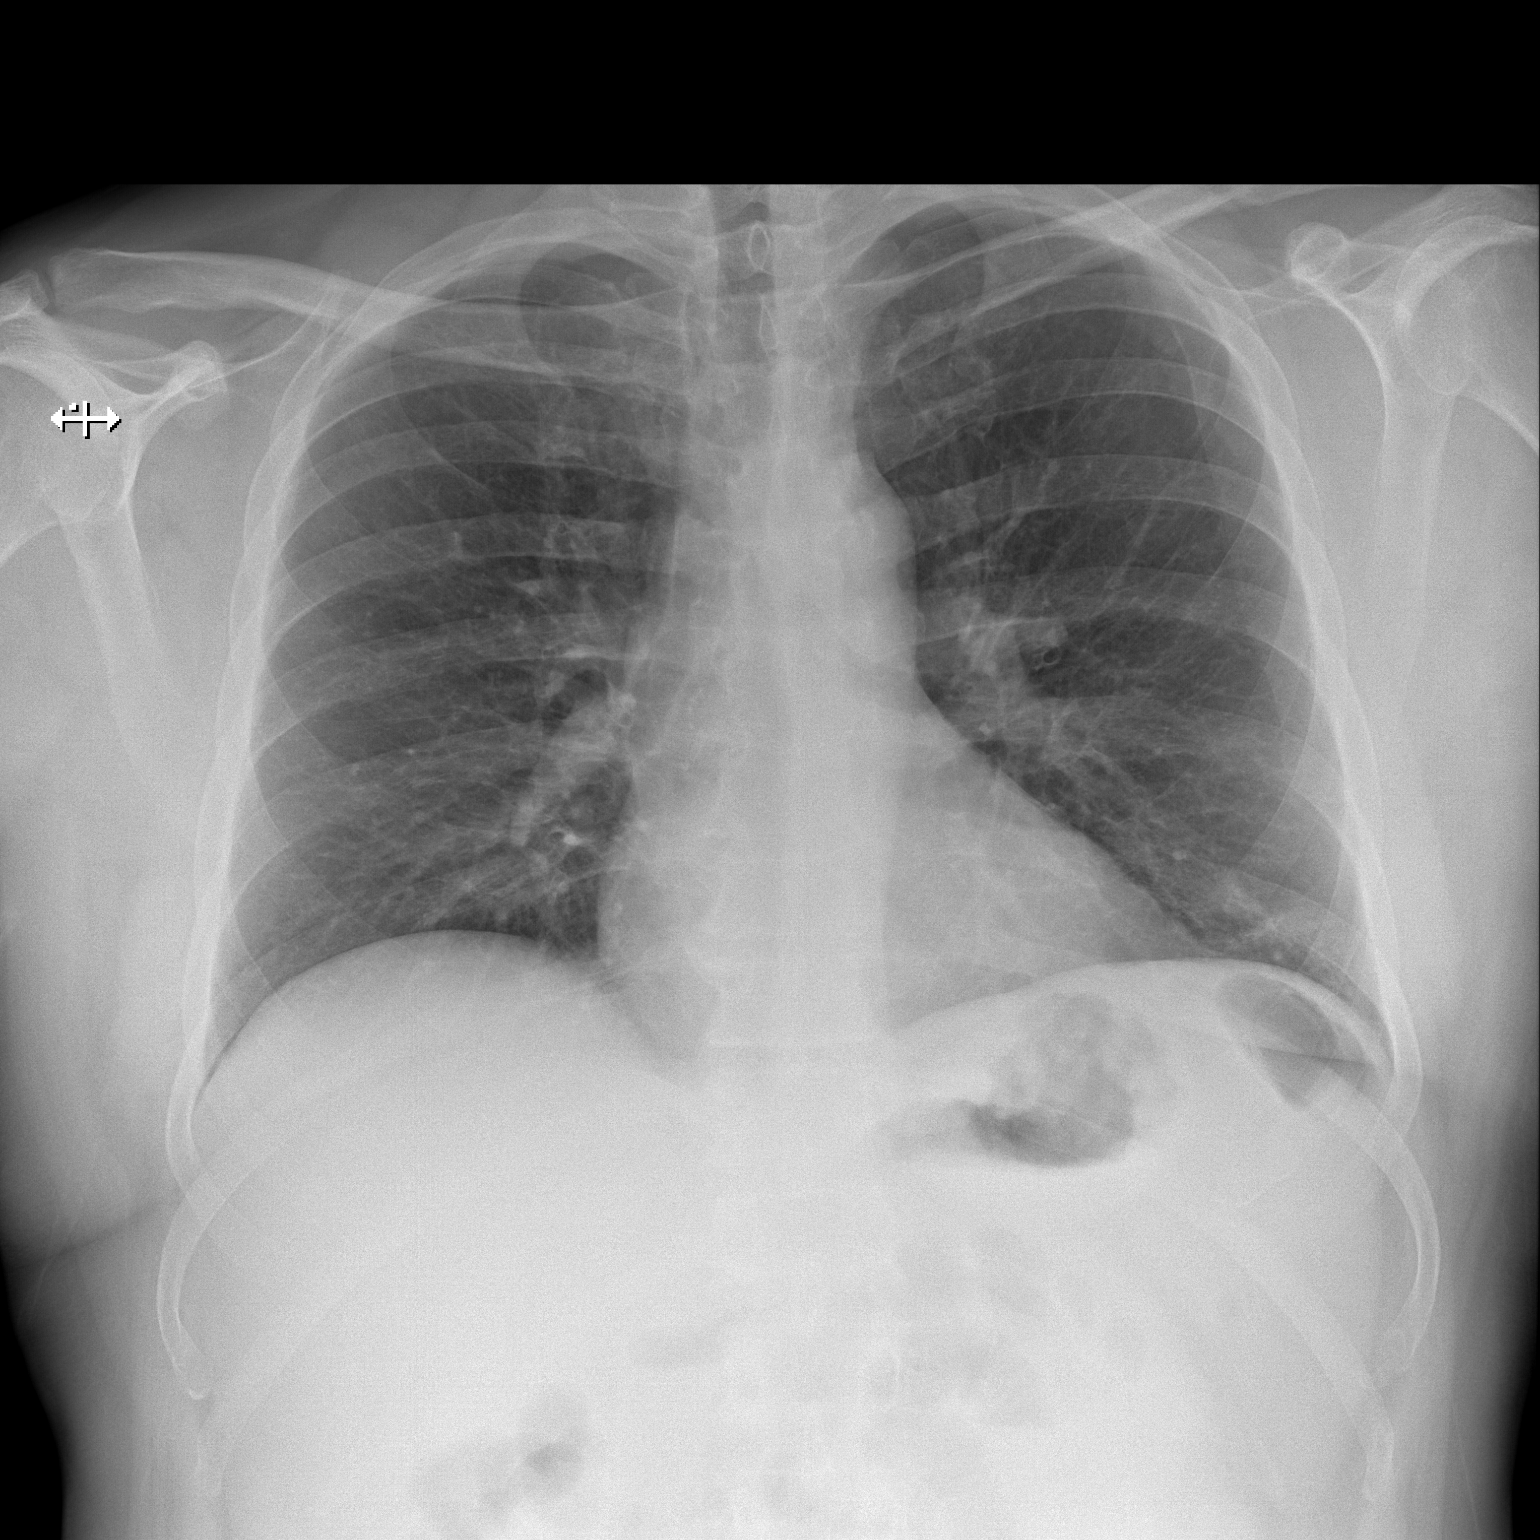
[im 2/2]
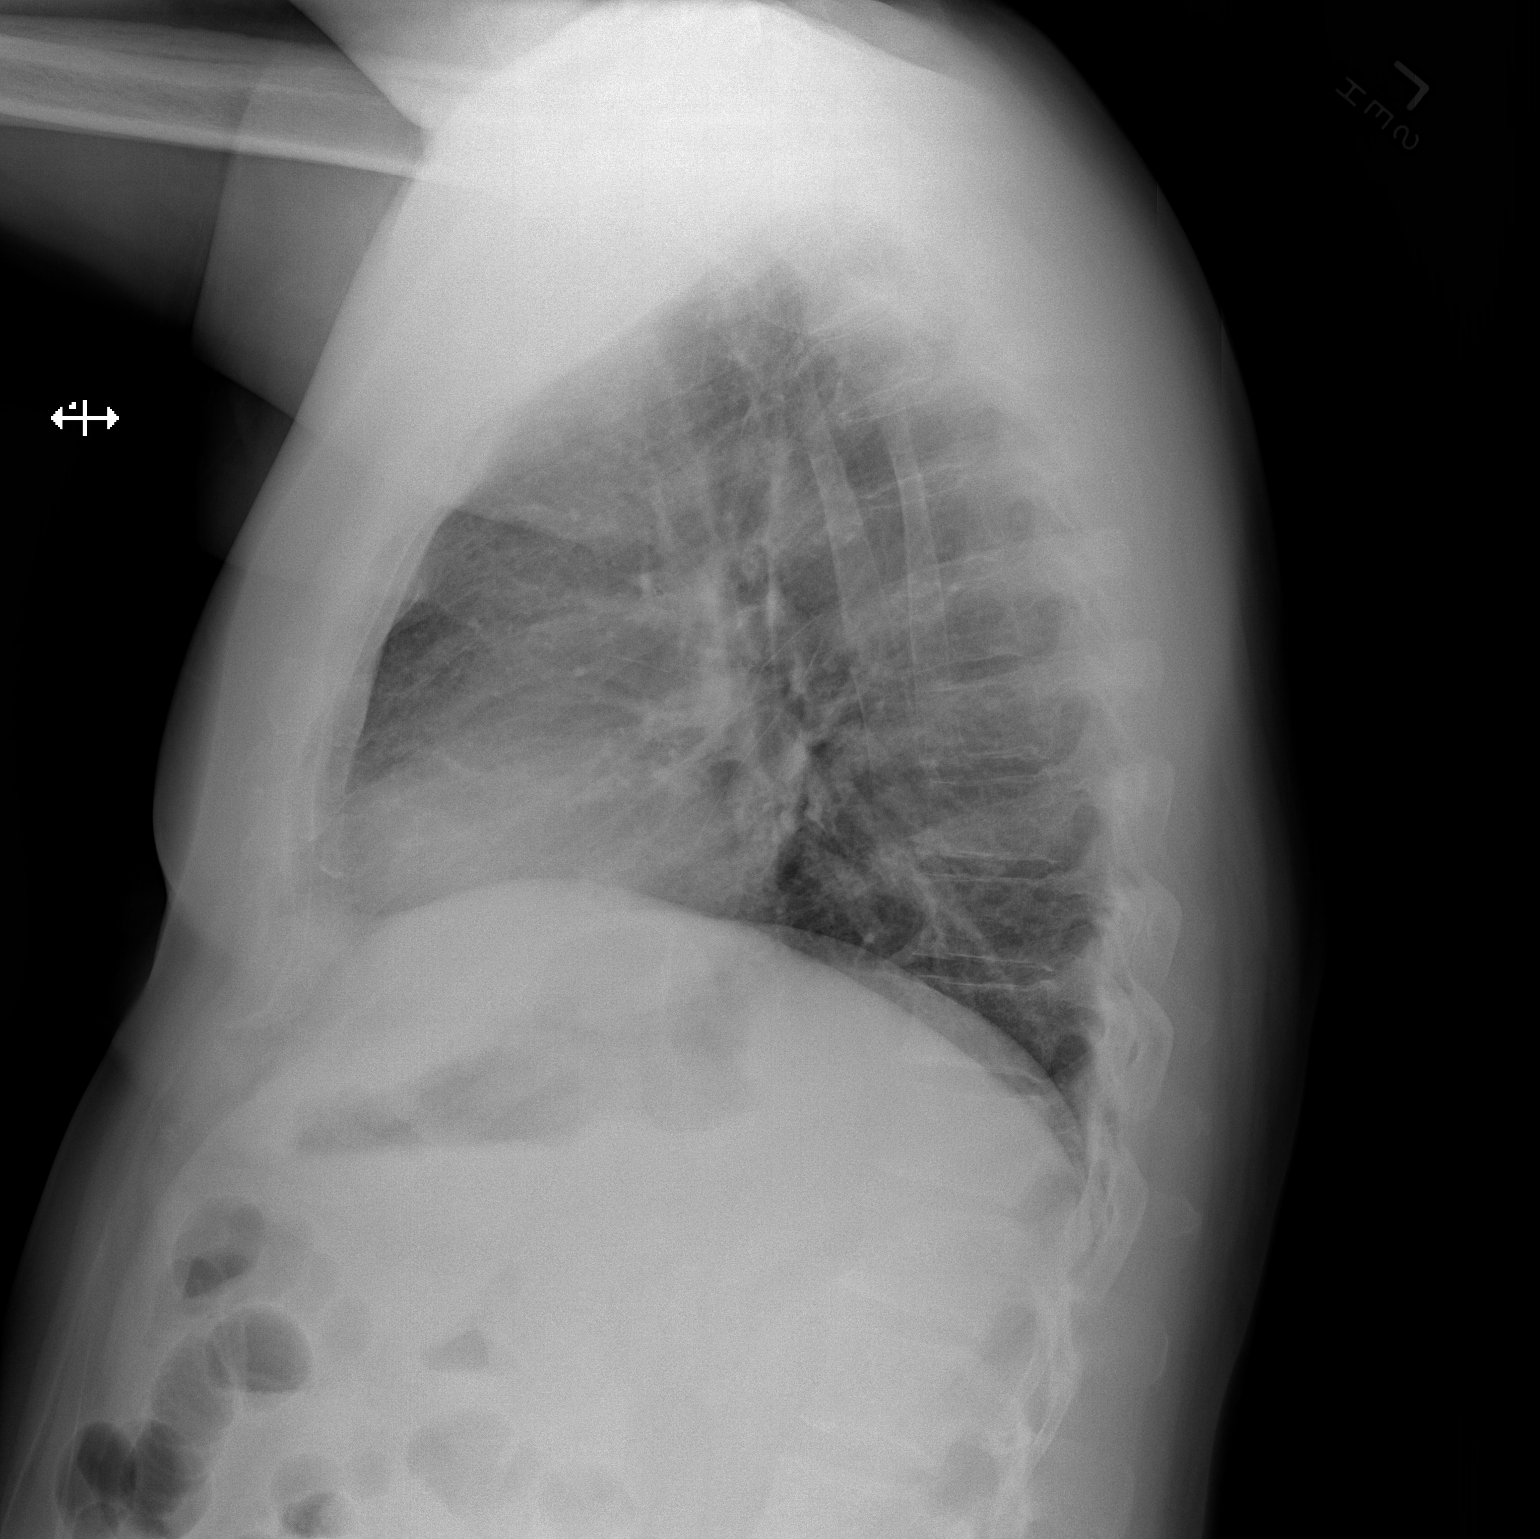

[2 of 2 positions shown; findings below may reference images not displayed]

IMPRESSION: Mild atelectasis versus scarring left lung base.

## 2013-05-23 ENCOUNTER — Emergency Department: Payer: Self-pay | Admitting: Unknown Physician Specialty

## 2013-05-26 ENCOUNTER — Emergency Department: Payer: Self-pay | Admitting: Emergency Medicine

## 2013-05-26 LAB — CBC
HCT: 45.7 % (ref 40.0–52.0)
HGB: 15.6 g/dL (ref 13.0–18.0)
MCH: 29.7 pg (ref 26.0–34.0)
MCHC: 34.2 g/dL (ref 32.0–36.0)
MCV: 87 fL (ref 80–100)
Platelet: 254 10*3/uL (ref 150–440)
RBC: 5.26 10*6/uL (ref 4.40–5.90)
RDW: 13.3 % (ref 11.5–14.5)
WBC: 6.8 10*3/uL (ref 3.8–10.6)

## 2013-05-26 LAB — URINALYSIS, COMPLETE
Bilirubin,UR: NEGATIVE
Blood: NEGATIVE
Glucose,UR: NEGATIVE mg/dL (ref 0–75)
Hyaline Cast: 8
Nitrite: NEGATIVE
Ph: 5 (ref 4.5–8.0)
Protein: NEGATIVE
RBC,UR: 3 /HPF (ref 0–5)
Specific Gravity: 1.023 (ref 1.003–1.030)
Squamous Epithelial: 2
WBC UR: 11 /HPF (ref 0–5)

## 2013-05-26 LAB — COMPREHENSIVE METABOLIC PANEL
Albumin: 4.2 g/dL (ref 3.4–5.0)
Alkaline Phosphatase: 99 U/L (ref 50–136)
Anion Gap: 7 (ref 7–16)
BUN: 15 mg/dL (ref 7–18)
Bilirubin,Total: 0.6 mg/dL (ref 0.2–1.0)
Calcium, Total: 9.9 mg/dL (ref 8.5–10.1)
Chloride: 104 mmol/L (ref 98–107)
Co2: 26 mmol/L (ref 21–32)
Creatinine: 1.22 mg/dL (ref 0.60–1.30)
EGFR (African American): >60
EGFR (Non-African Amer.): >60
Glucose: 190 mg/dL — ABNORMAL HIGH (ref 65–99)
Osmolality: 280 (ref 275–301)
Potassium: 3.8 mmol/L (ref 3.5–5.1)
SGOT(AST): 18 U/L (ref 15–37)
SGPT (ALT): 25 U/L (ref 12–78)
Sodium: 137 mmol/L (ref 136–145)
Total Protein: 8.3 g/dL — ABNORMAL HIGH (ref 6.4–8.2)

## 2013-05-26 LAB — TROPONIN I: Troponin-I: <0.02 ng/mL

## 2013-05-26 LAB — LIPASE, BLOOD: Lipase: 124 U/L (ref 73–393)

## 2013-07-04 ENCOUNTER — Emergency Department: Payer: Self-pay | Admitting: Emergency Medicine

## 2013-07-04 LAB — BASIC METABOLIC PANEL
Anion Gap: 7 (ref 7–16)
BUN: 21 mg/dL — ABNORMAL HIGH (ref 7–18)
Calcium, Total: 9.3 mg/dL (ref 8.5–10.1)
Chloride: 105 mmol/L (ref 98–107)
Co2: 26 mmol/L (ref 21–32)
Creatinine: 1.02 mg/dL (ref 0.60–1.30)
EGFR (African American): >60
EGFR (Non-African Amer.): >60
Glucose: 141 mg/dL — ABNORMAL HIGH (ref 65–99)
Osmolality: 281 (ref 275–301)
Potassium: 4.3 mmol/L (ref 3.5–5.1)
Sodium: 138 mmol/L (ref 136–145)

## 2013-07-04 LAB — CBC
HCT: 42.6 % (ref 40.0–52.0)
HGB: 14.7 g/dL (ref 13.0–18.0)
MCH: 29.8 pg (ref 26.0–34.0)
MCHC: 34.4 g/dL (ref 32.0–36.0)
MCV: 87 fL (ref 80–100)
Platelet: 226 10*3/uL (ref 150–440)
RBC: 4.92 10*6/uL (ref 4.40–5.90)
RDW: 13.7 % (ref 11.5–14.5)
WBC: 8.1 10*3/uL (ref 3.8–10.6)

## 2013-07-04 LAB — TROPONIN I
Troponin-I: <0.02 ng/mL
Troponin-I: <0.02 ng/mL

## 2013-07-07 ENCOUNTER — Emergency Department: Payer: Self-pay | Admitting: Emergency Medicine

## 2013-07-07 LAB — DRUG SCREEN, URINE

## 2013-07-07 LAB — URINALYSIS, COMPLETE
Bacteria: NONE SEEN
Bilirubin,UR: NEGATIVE
Blood: NEGATIVE
Glucose,UR: NEGATIVE mg/dL (ref 0–75)
Ketone: NEGATIVE
Nitrite: NEGATIVE
Ph: 6 (ref 4.5–8.0)
Protein: NEGATIVE
RBC,UR: <1 /HPF (ref 0–5)
Specific Gravity: 1.005 (ref 1.003–1.030)
Squamous Epithelial: <1
WBC UR: 1 /HPF (ref 0–5)

## 2013-07-07 LAB — ETHANOL
Ethanol %: 0.17 % — ABNORMAL HIGH (ref 0.000–0.080)
Ethanol: 170 mg/dL

## 2013-07-07 LAB — CBC
HCT: 42.3 % (ref 40.0–52.0)
HGB: 14.7 g/dL (ref 13.0–18.0)
MCH: 30.1 pg (ref 26.0–34.0)
MCHC: 34.9 g/dL (ref 32.0–36.0)
MCV: 86 fL (ref 80–100)
Platelet: 253 10*3/uL (ref 150–440)
RBC: 4.9 10*6/uL (ref 4.40–5.90)
RDW: 13.7 % (ref 11.5–14.5)
WBC: 8.2 10*3/uL (ref 3.8–10.6)

## 2013-07-07 LAB — COMPREHENSIVE METABOLIC PANEL
Albumin: 3.9 g/dL (ref 3.4–5.0)
Alkaline Phosphatase: 92 U/L (ref 50–136)
Anion Gap: 8 (ref 7–16)
BUN: 14 mg/dL (ref 7–18)
Bilirubin,Total: 0.4 mg/dL (ref 0.2–1.0)
Calcium, Total: 9 mg/dL (ref 8.5–10.1)
Chloride: 109 mmol/L — ABNORMAL HIGH (ref 98–107)
Co2: 24 mmol/L (ref 21–32)
Creatinine: 1.03 mg/dL (ref 0.60–1.30)
EGFR (African American): >60
EGFR (Non-African Amer.): >60
Glucose: 91 mg/dL (ref 65–99)
Osmolality: 281 (ref 275–301)
Potassium: 4 mmol/L (ref 3.5–5.1)
SGOT(AST): 7 U/L — ABNORMAL LOW (ref 15–37)
SGPT (ALT): 25 U/L (ref 12–78)
Sodium: 141 mmol/L (ref 136–145)
Total Protein: 7.7 g/dL (ref 6.4–8.2)

## 2013-07-07 LAB — TSH: Thyroid Stimulating Horm: 0.334 u[IU]/mL — ABNORMAL LOW

## 2013-07-08 ENCOUNTER — Emergency Department: Payer: Self-pay | Admitting: Emergency Medicine

## 2013-07-10 ENCOUNTER — Emergency Department: Payer: Self-pay | Admitting: Emergency Medicine

## 2013-07-10 LAB — CBC
HCT: 42.3 % (ref 40.0–52.0)
HGB: 14.5 g/dL (ref 13.0–18.0)
MCH: 29.6 pg (ref 26.0–34.0)
MCHC: 34.3 g/dL (ref 32.0–36.0)
MCV: 86 fL (ref 80–100)
Platelet: 276 10*3/uL (ref 150–440)
RBC: 4.9 10*6/uL (ref 4.40–5.90)
RDW: 13.3 % (ref 11.5–14.5)
WBC: 8.7 10*3/uL (ref 3.8–10.6)

## 2013-07-10 LAB — TSH: Thyroid Stimulating Horm: 0.588 u[IU]/mL

## 2013-07-10 LAB — COMPREHENSIVE METABOLIC PANEL
Albumin: 4 g/dL (ref 3.4–5.0)
Alkaline Phosphatase: 101 U/L (ref 50–136)
Anion Gap: 9 (ref 7–16)
BUN: 12 mg/dL (ref 7–18)
Bilirubin,Total: 1 mg/dL (ref 0.2–1.0)
Calcium, Total: 9.9 mg/dL (ref 8.5–10.1)
Chloride: 101 mmol/L (ref 98–107)
Co2: 26 mmol/L (ref 21–32)
Creatinine: 1.02 mg/dL (ref 0.60–1.30)
EGFR (African American): >60
EGFR (Non-African Amer.): >60
Glucose: 131 mg/dL — ABNORMAL HIGH (ref 65–99)
Osmolality: 274 (ref 275–301)
Potassium: 4.1 mmol/L (ref 3.5–5.1)
SGOT(AST): 13 U/L — ABNORMAL LOW (ref 15–37)
SGPT (ALT): 21 U/L (ref 12–78)
Sodium: 136 mmol/L (ref 136–145)
Total Protein: 8 g/dL (ref 6.4–8.2)

## 2013-07-11 LAB — URINALYSIS, COMPLETE
Bilirubin,UR: NEGATIVE
Blood: NEGATIVE
Glucose,UR: NEGATIVE mg/dL (ref 0–75)
Hyaline Cast: 5
Nitrite: NEGATIVE
Ph: 5 (ref 4.5–8.0)
Protein: NEGATIVE
RBC,UR: 2 /HPF (ref 0–5)
Specific Gravity: 1.011 (ref 1.003–1.030)
Squamous Epithelial: NONE SEEN
WBC UR: 10 /HPF (ref 0–5)

## 2013-07-11 LAB — DRUG SCREEN, URINE

## 2013-07-22 IMAGING — CT CT STONE STUDY
1 of 2 series · 15 of 32 positions shown, 19 images · non-contrast
Comparison: none

REASON FOR EXAM: right flank, RUQ, RLQ pain, h/o kidney stones
COMMENTS:

PROCEDURE:     CT  - CT ABDOMEN /PELVIS WO (STONE)  - October 21, 2012  [DATE]
RESULT:     Comparison: None
TECHNIQUE: Multiple axial images from the lung bases to the symphysis pubis
were obtained without oral and without intravenous contrast.

[Series 2: 3mm soft tissue · axial · 0.76mm/px · z∈[-678,-210]mm · 15 of 170 slices shown, 19 images]
[im 7/170  soft-tissue]
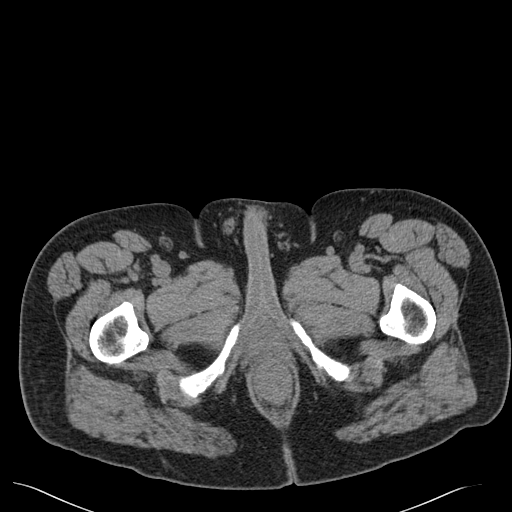
[im 7/170  bone]
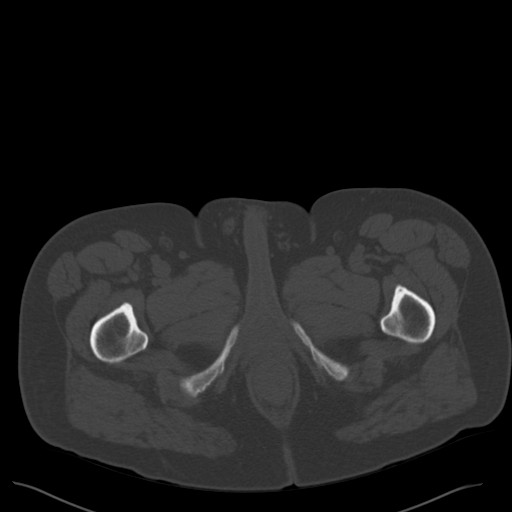
[im 21/170  soft-tissue]
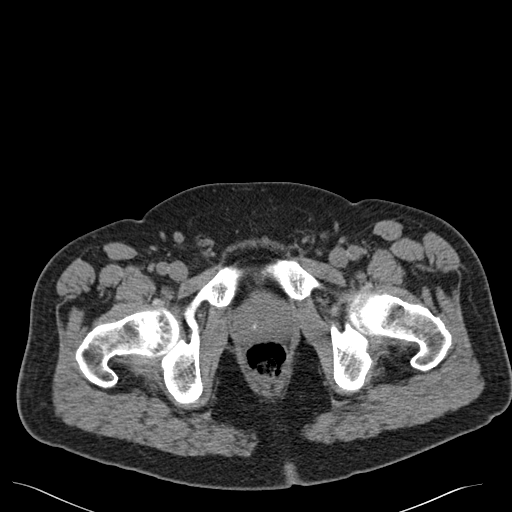
[im 34/170  soft-tissue]
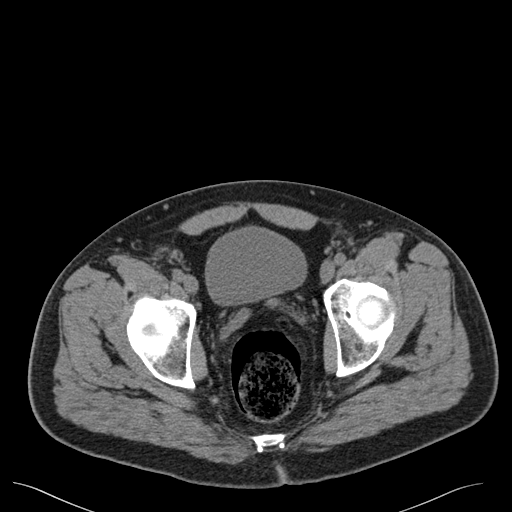
[im 48/170  soft-tissue]
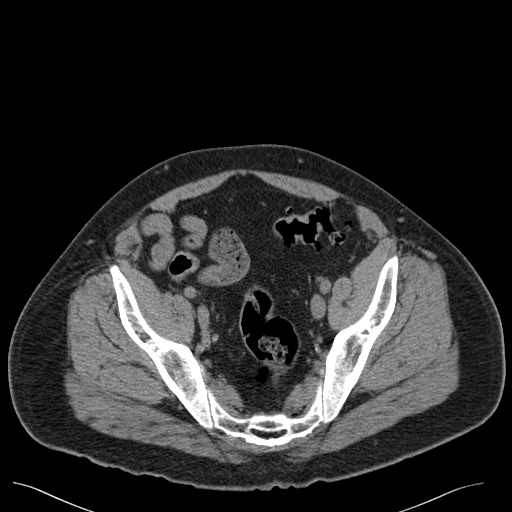
[im 61/170  soft-tissue]
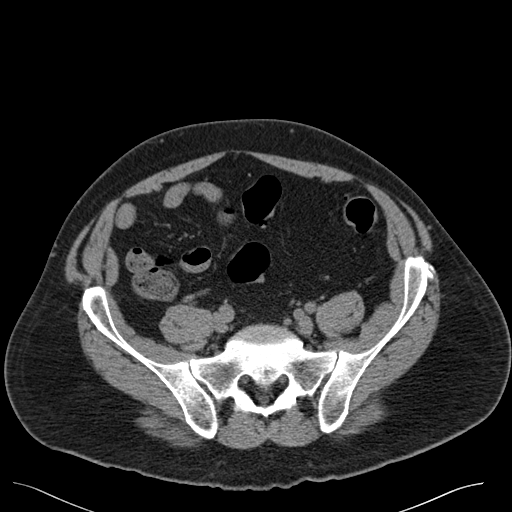
[im 75/170  soft-tissue]
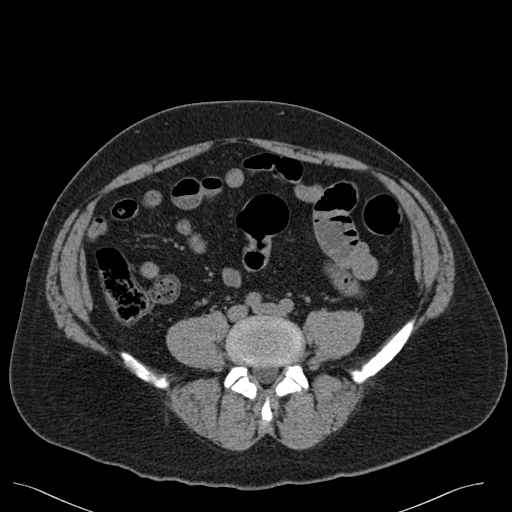
[im 88/170  soft-tissue]
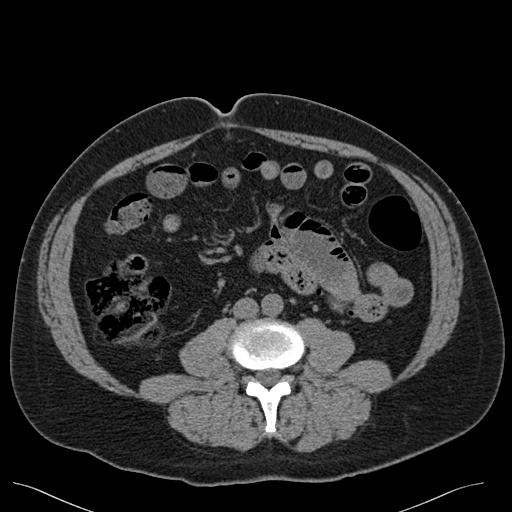
[im 95/170  soft-tissue]
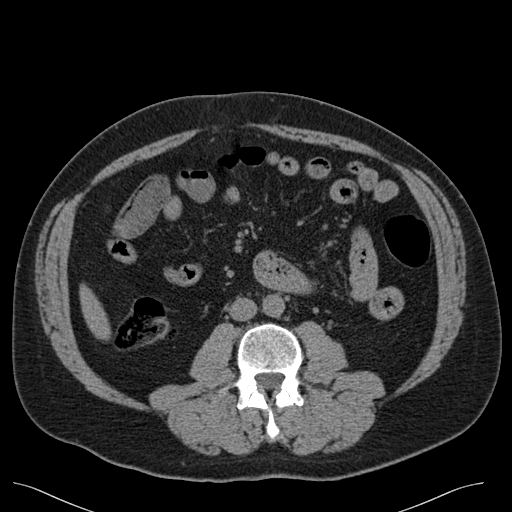
[im 109/170  soft-tissue]
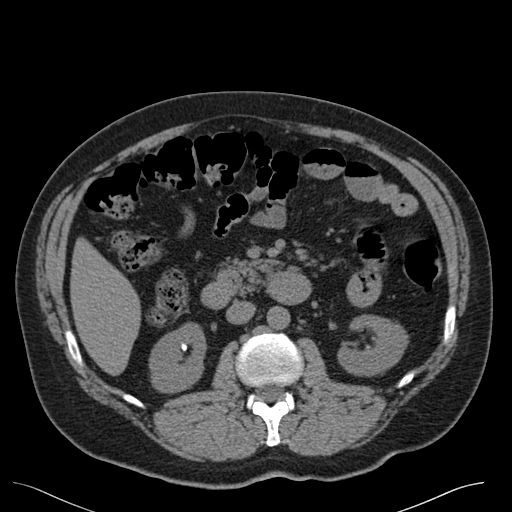
[im 109/170  bone]
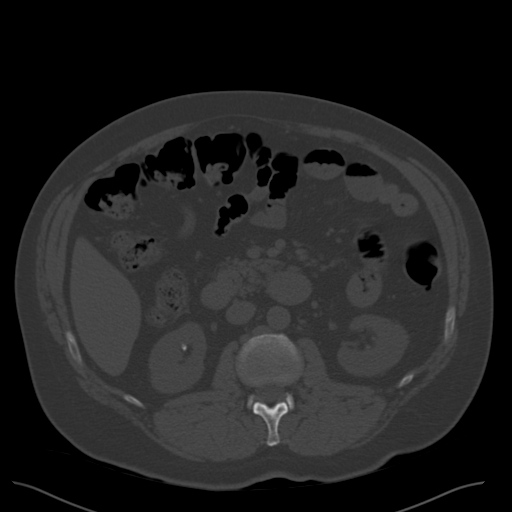
[im 122/170  soft-tissue]
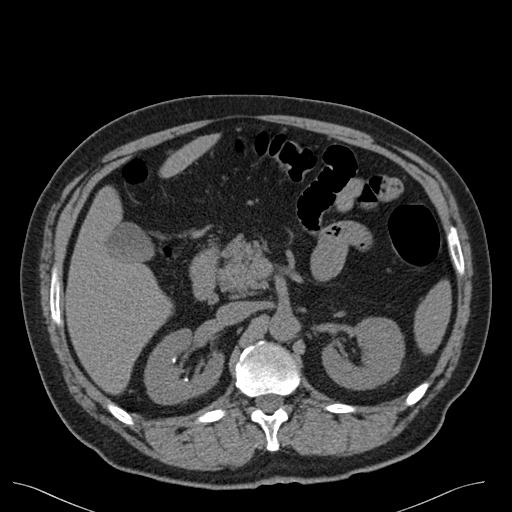
[im 136/170  soft-tissue]
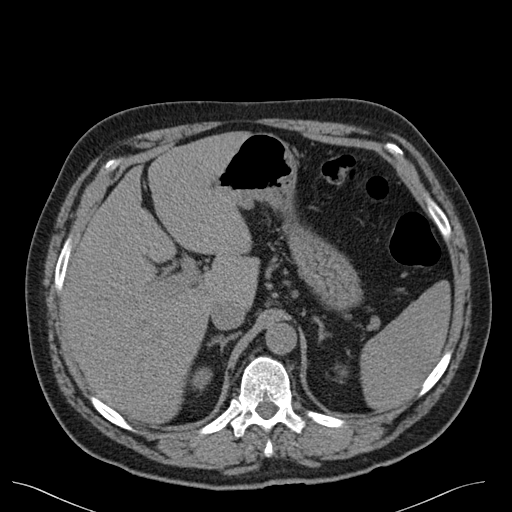
[im 142/170  lung]
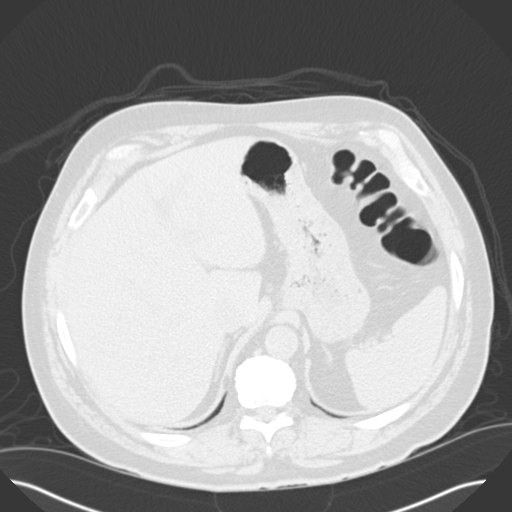
[im 149/170  soft-tissue]
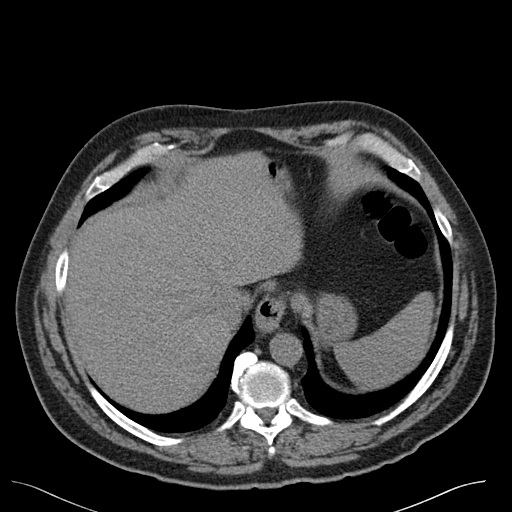
[im 149/170  lung]
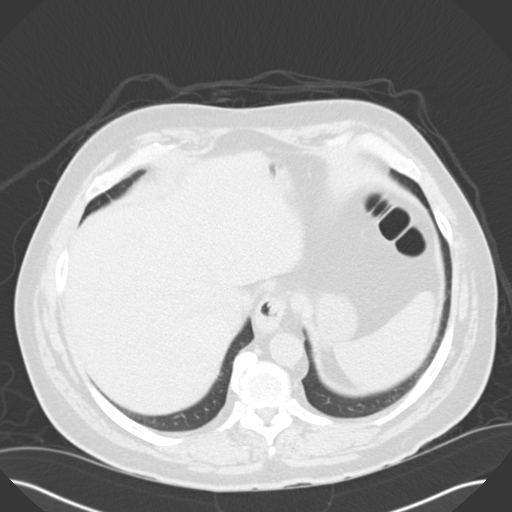
[im 156/170  lung]
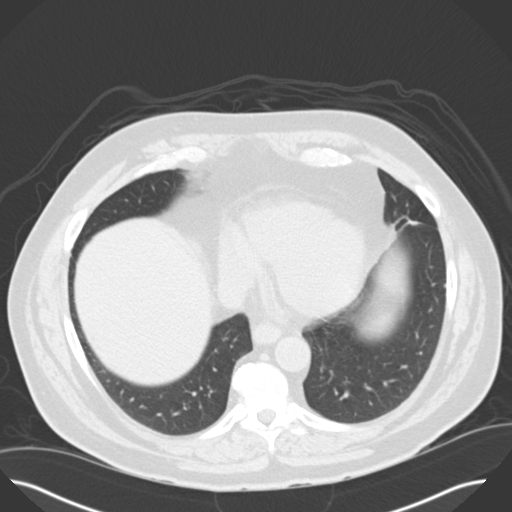
[im 163/170  soft-tissue]
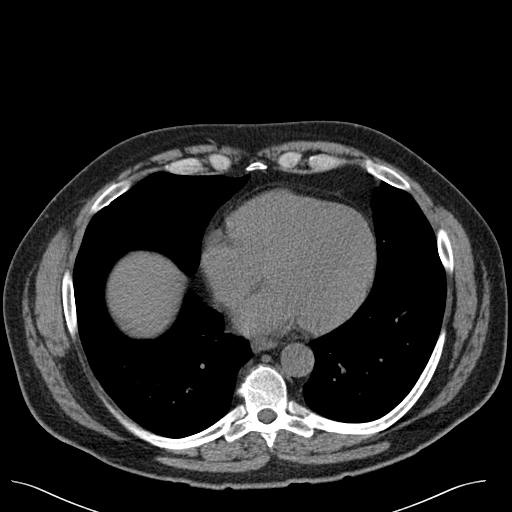
[im 163/170  lung]
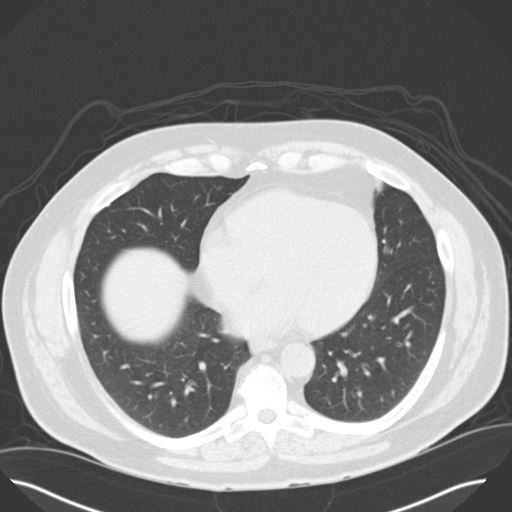

[15 of 32 positions shown; findings below may reference images not displayed]

FINDINGS: Mild opacities in the lingula likely represent atelectasis.

Lack of intravenous contrast limits evaluation of the solid abdominal
organs.  Grossly, the liver, gallbladder, spleen, adrenals, and pancreas are
unremarkable. There is a small multilobulated duodenal diverticulum
extending from the second segment of the duodenum.

There multiple bilateral renal calculi. The largest is seen in the left
kidney, measuring 6 mm. There is no hydronephrosis. No ureterectasis.

There are a few loops of fluid-filled small bowel in the left hemiabdomen
which are at the upper limits normal in size. There does not appear to be a
definite transition point. There is diverticulosis of the sigmoid colon. The
appendix is normal. There is a small fat-containing periumbilical hernia.

No aggressive lytic or sclerotic osseous lesions are identified.
IMPRESSION: 1. Bilateral nephrolithiasis, without hydronephrosis.
2. There are a few loops of fluid-filled small bowel in the left hemiabdomen
which are at the upper limits of normal in size. This is nonspecific and may
related to mild ileus. Early or partial small bowel obstruction is not
excluded.
3. There are findings which likely represent a small multilobulated duodenal
diverticulum extending from the second segment of the duodenum. However, if
there is continued clinical concern, further traversing could be provided
endoscopy.

## 2013-07-24 IMAGING — CT CT ABD-PELV W/ CM
1 of 2 series · 15 of 32 positions shown, 19 images · IV contrast (isovue)
Comparison: none

REASON FOR EXAM: (1) rt flank pain; (2) same
COMMENTS:

PROCEDURE:     CT  - CT ABDOMEN / PELVIS  W  - October 23, 2012  [DATE]
RESULT:     Comparison:  10/21/2012
TECHNIQUE: Multiple axial images of the abdomen and pelvis were performed
from the lung bases to the pubic symphysis, with p.o. contrast and with 100
mL of Isovue 370 intravenous contrast.

[Series 2: 3mm soft tissue · axial · 0.75mm/px · z∈[-1190,-728]mm · 15 of 170 slices shown, 19 images]
[im 8/170  soft-tissue]
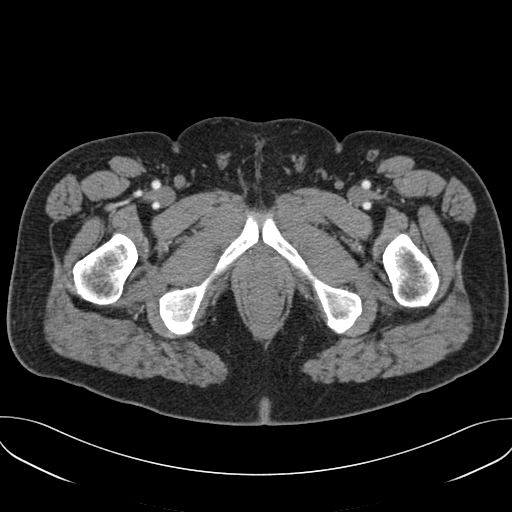
[im 8/170  bone]
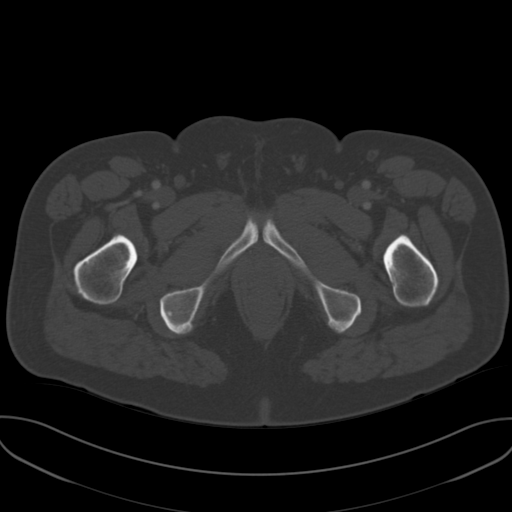
[im 22/170  soft-tissue]
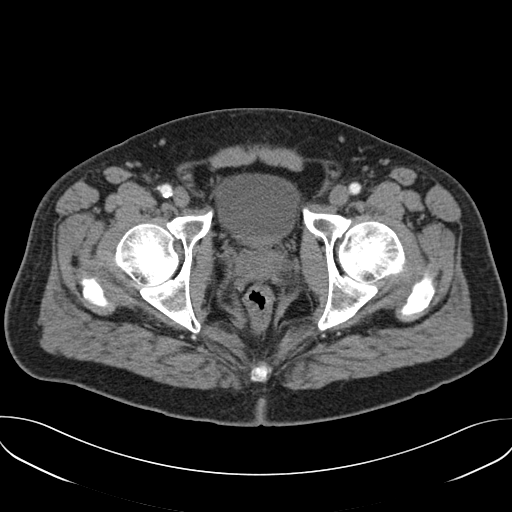
[im 36/170  soft-tissue]
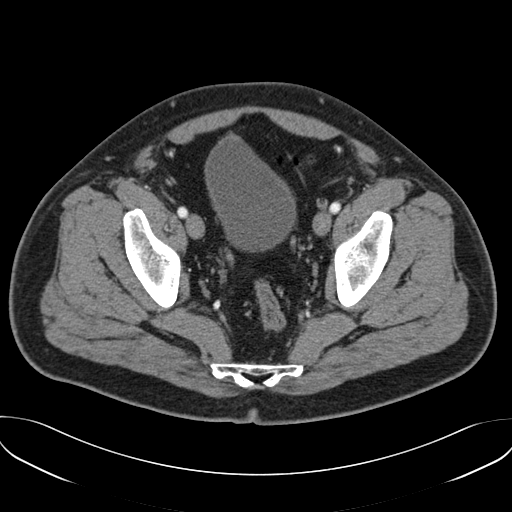
[im 50/170  soft-tissue]
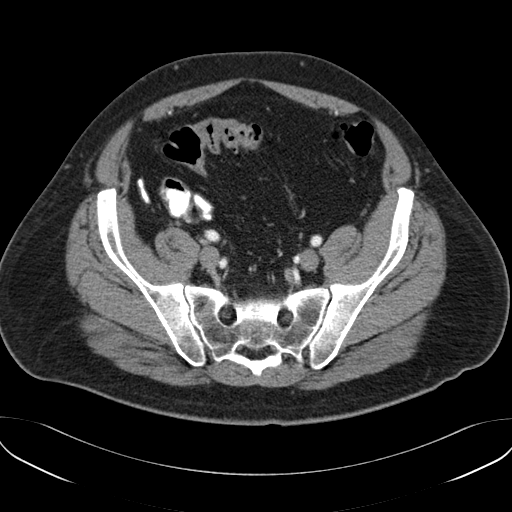
[im 57/170  soft-tissue]
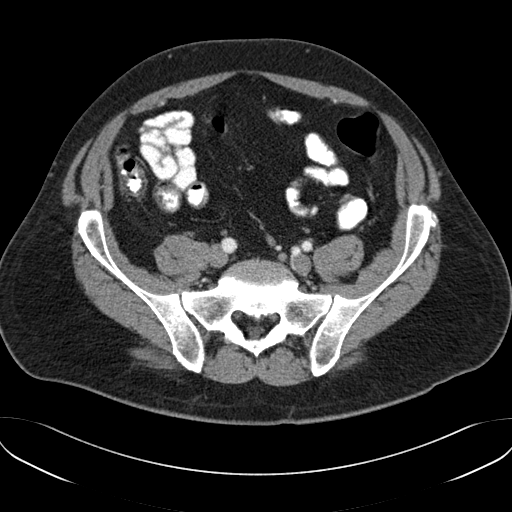
[im 71/170  soft-tissue]
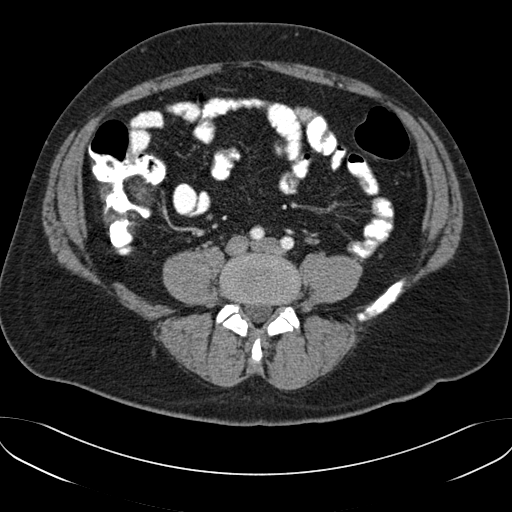
[im 85/170  soft-tissue]
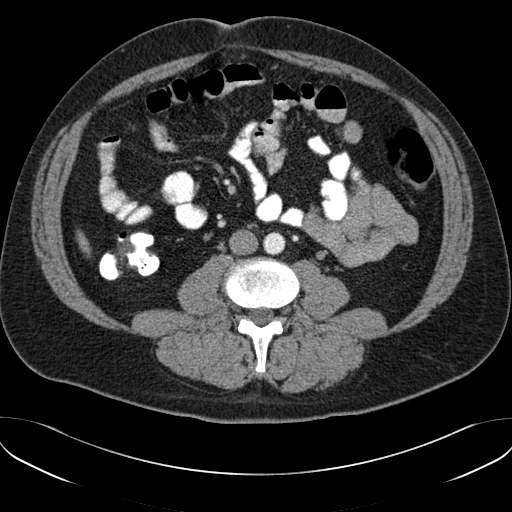
[im 99/170  soft-tissue]
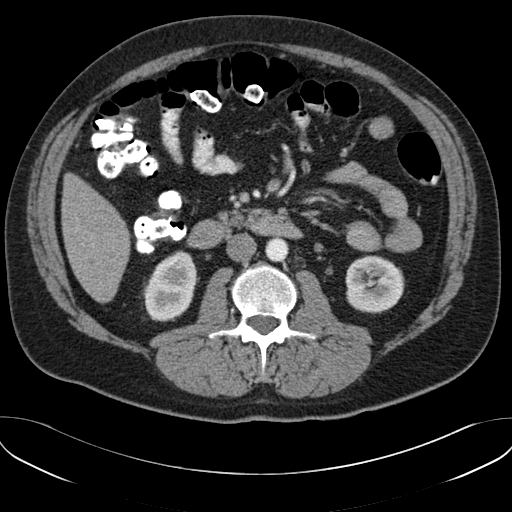
[im 113/170  soft-tissue]
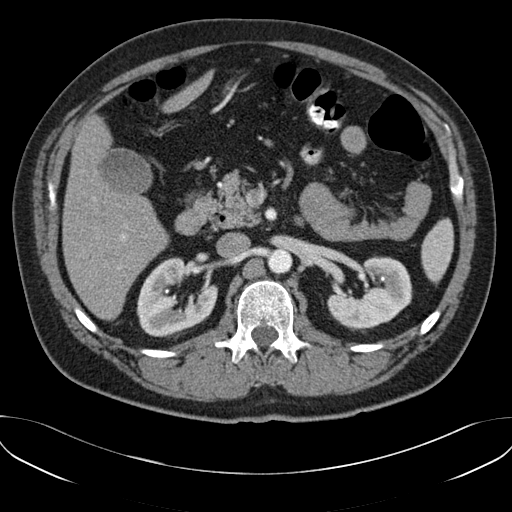
[im 113/170  bone]
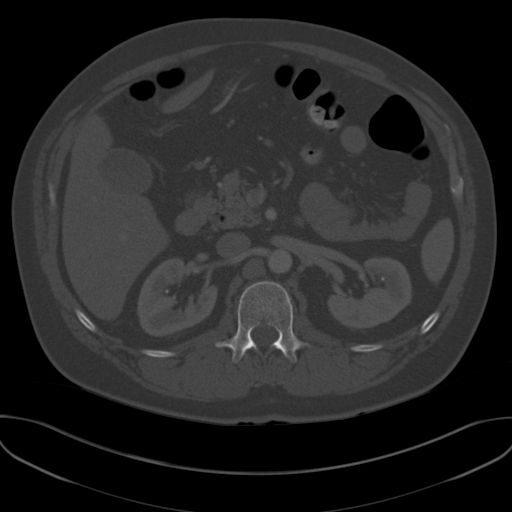
[im 120/170  soft-tissue]
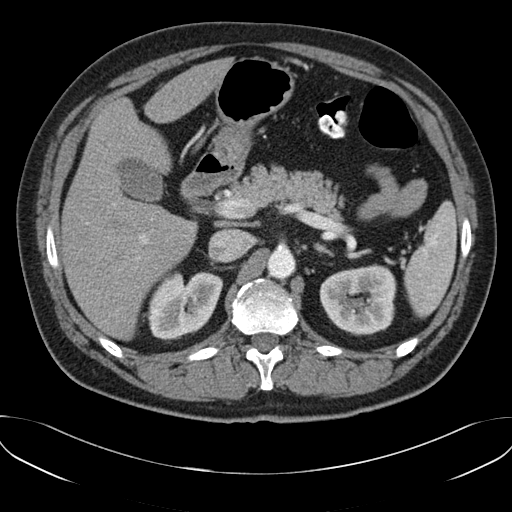
[im 134/170  soft-tissue]
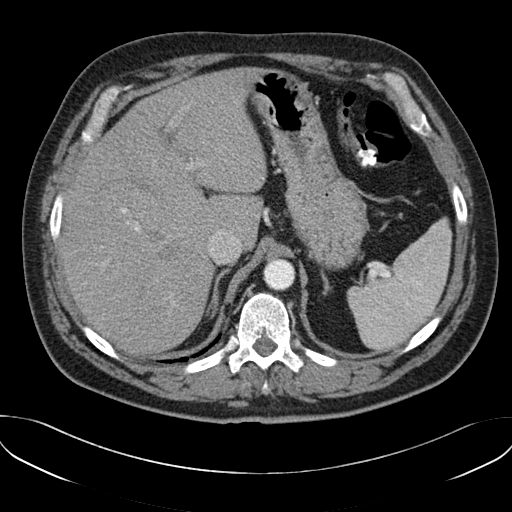
[im 141/170  lung]
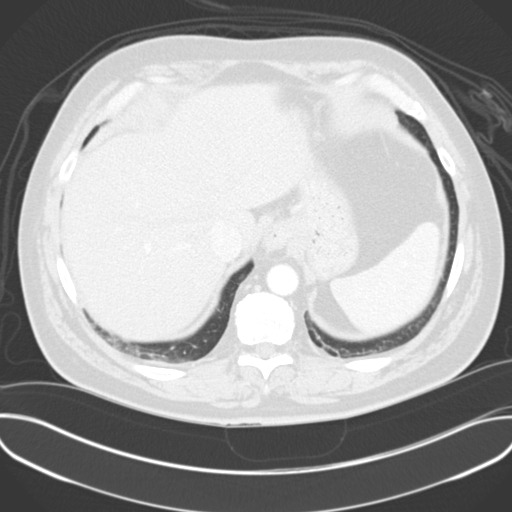
[im 148/170  soft-tissue]
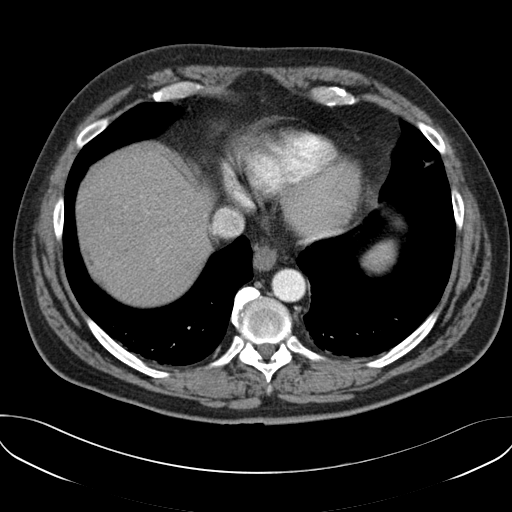
[im 148/170  lung]
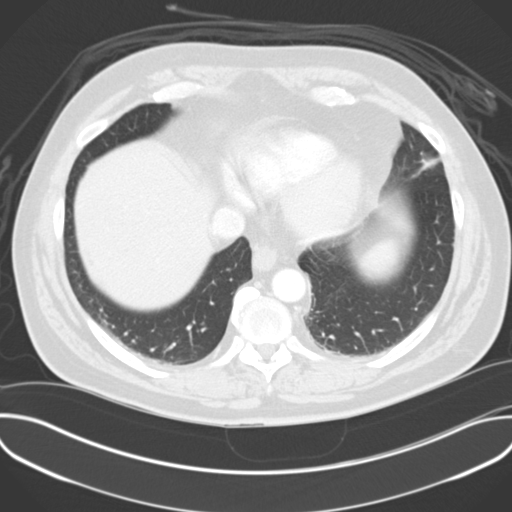
[im 155/170  lung]
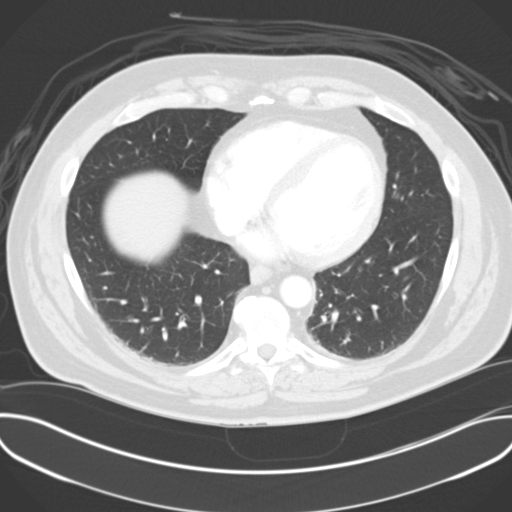
[im 162/170  soft-tissue]
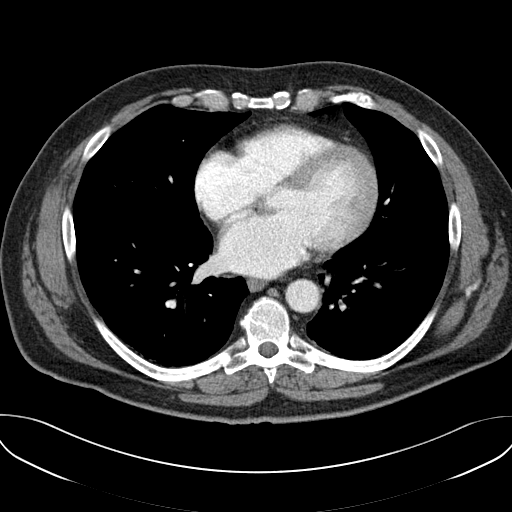
[im 162/170  lung]
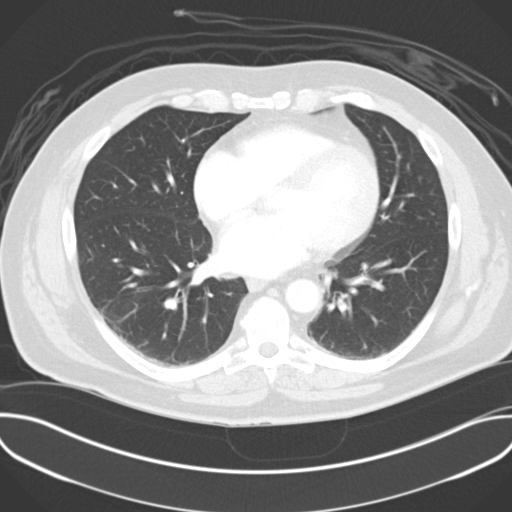

[15 of 32 positions shown; findings below may reference images not displayed]

FINDINGS: Mild basilar opacities are likely secondary to atelectasis.

The liver, gallbladder, spleen, adrenals, and pancreas are unremarkable.
There is a small multilobulated duodenal diverticulum extending medially
from the second segment of the duodenum. There are no adjacent inflammatory
changes.

There are several bilateral renal calculi. No hydronephrosis. The kidneys
enhance normally. No ureterectasis.

The small and large bowel are normal in caliber. There is diverticulosis of
the sigmoid colon. The appendix is normal.

Small sclerotic density in the right superior acetabulum likely represents a
bone island.
IMPRESSION: 1. No acute findings in the abdomen or pelvis.
2. Bilateral nephrolithiasis, without hydronephrosis.
3. Diverticulosis of the sigmoid colon.

[REDACTED]

## 2013-08-18 ENCOUNTER — Emergency Department: Payer: Self-pay | Admitting: Emergency Medicine

## 2013-08-18 LAB — COMPREHENSIVE METABOLIC PANEL
Albumin: 3.9 g/dL (ref 3.4–5.0)
Alkaline Phosphatase: 88 U/L (ref 50–136)
Anion Gap: 6 — ABNORMAL LOW (ref 7–16)
BUN: 18 mg/dL (ref 7–18)
Bilirubin,Total: 0.7 mg/dL (ref 0.2–1.0)
Calcium, Total: 9.2 mg/dL (ref 8.5–10.1)
Chloride: 106 mmol/L (ref 98–107)
Co2: 25 mmol/L (ref 21–32)
Creatinine: 1.39 mg/dL — ABNORMAL HIGH (ref 0.60–1.30)
EGFR (African American): >60
EGFR (Non-African Amer.): 57 — ABNORMAL LOW
Glucose: 122 mg/dL — ABNORMAL HIGH (ref 65–99)
Osmolality: 277 (ref 275–301)
Potassium: 3.8 mmol/L (ref 3.5–5.1)
SGOT(AST): 20 U/L (ref 15–37)
SGPT (ALT): 28 U/L (ref 12–78)
Sodium: 137 mmol/L (ref 136–145)
Total Protein: 7.2 g/dL (ref 6.4–8.2)

## 2013-08-18 LAB — URINALYSIS, COMPLETE
Bacteria: NONE SEEN
Bilirubin,UR: NEGATIVE
Blood: NEGATIVE
Glucose,UR: NEGATIVE mg/dL (ref 0–75)
Ketone: NEGATIVE
Nitrite: NEGATIVE
Ph: 5 (ref 4.5–8.0)
Protein: NEGATIVE
RBC,UR: 2 /HPF (ref 0–5)
Specific Gravity: 1.019 (ref 1.003–1.030)
Squamous Epithelial: 1
WBC UR: 6 /HPF (ref 0–5)

## 2013-08-18 LAB — LIPASE, BLOOD: Lipase: 171 U/L (ref 73–393)

## 2013-08-18 LAB — CBC
HCT: 38.6 % — ABNORMAL LOW (ref 40.0–52.0)
HGB: 13.4 g/dL (ref 13.0–18.0)
MCH: 29.5 pg (ref 26.0–34.0)
MCHC: 34.8 g/dL (ref 32.0–36.0)
MCV: 85 fL (ref 80–100)
Platelet: 200 10*3/uL (ref 150–440)
RBC: 4.56 10*6/uL (ref 4.40–5.90)
RDW: 14.4 % (ref 11.5–14.5)
WBC: 6.7 10*3/uL (ref 3.8–10.6)

## 2013-09-26 IMAGING — CR DG WRIST COMPLETE 3+V*R*
1 series · 4 of 4 positions shown · non-contrast
Comparison: none

REASON FOR EXAM: right wrist pain
COMMENTS:

PROCEDURE:     JUMPER - JUMPER WRIST RT COMP WITH OBLIQUES  - December 26, 2012  [DATE]
RESULT:     Comparison: None.

[Series 1: pa · 0.17mm/px · 4 of 4 slices shown]
[im 1/4]
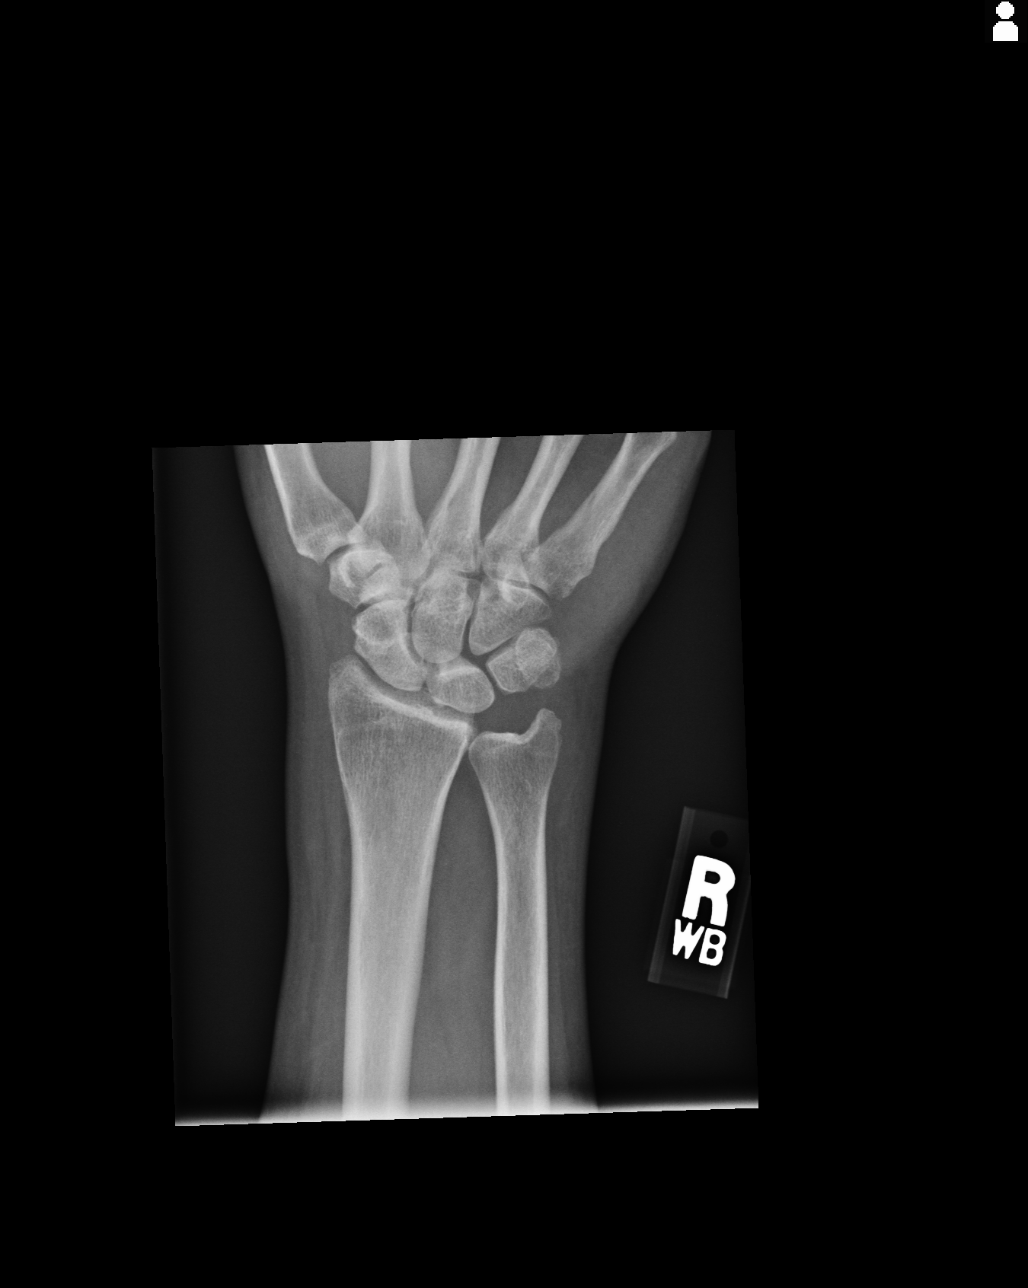
[im 2/4]
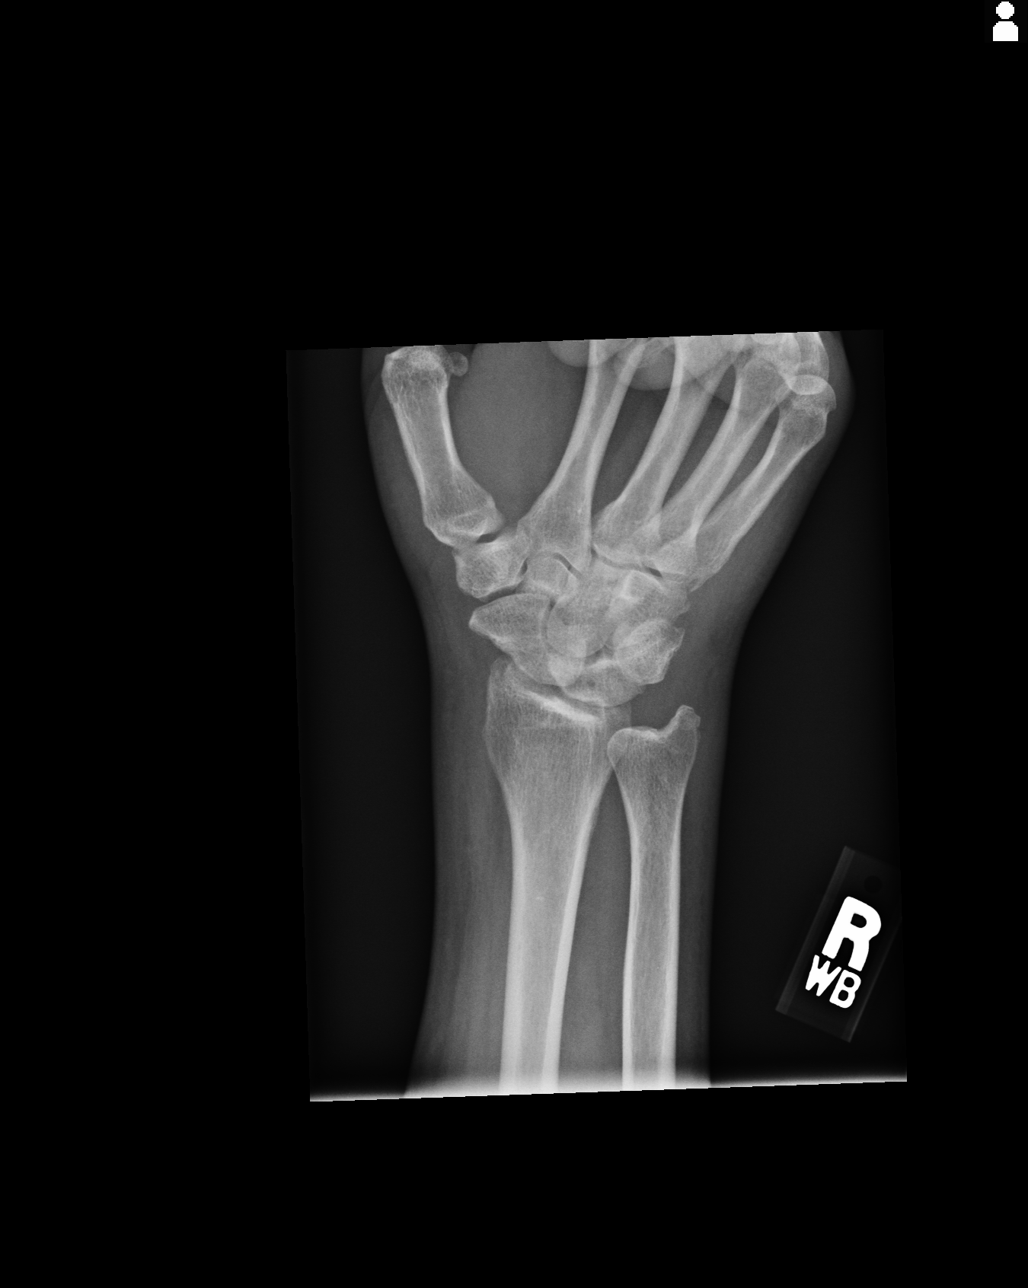
[im 3/4]
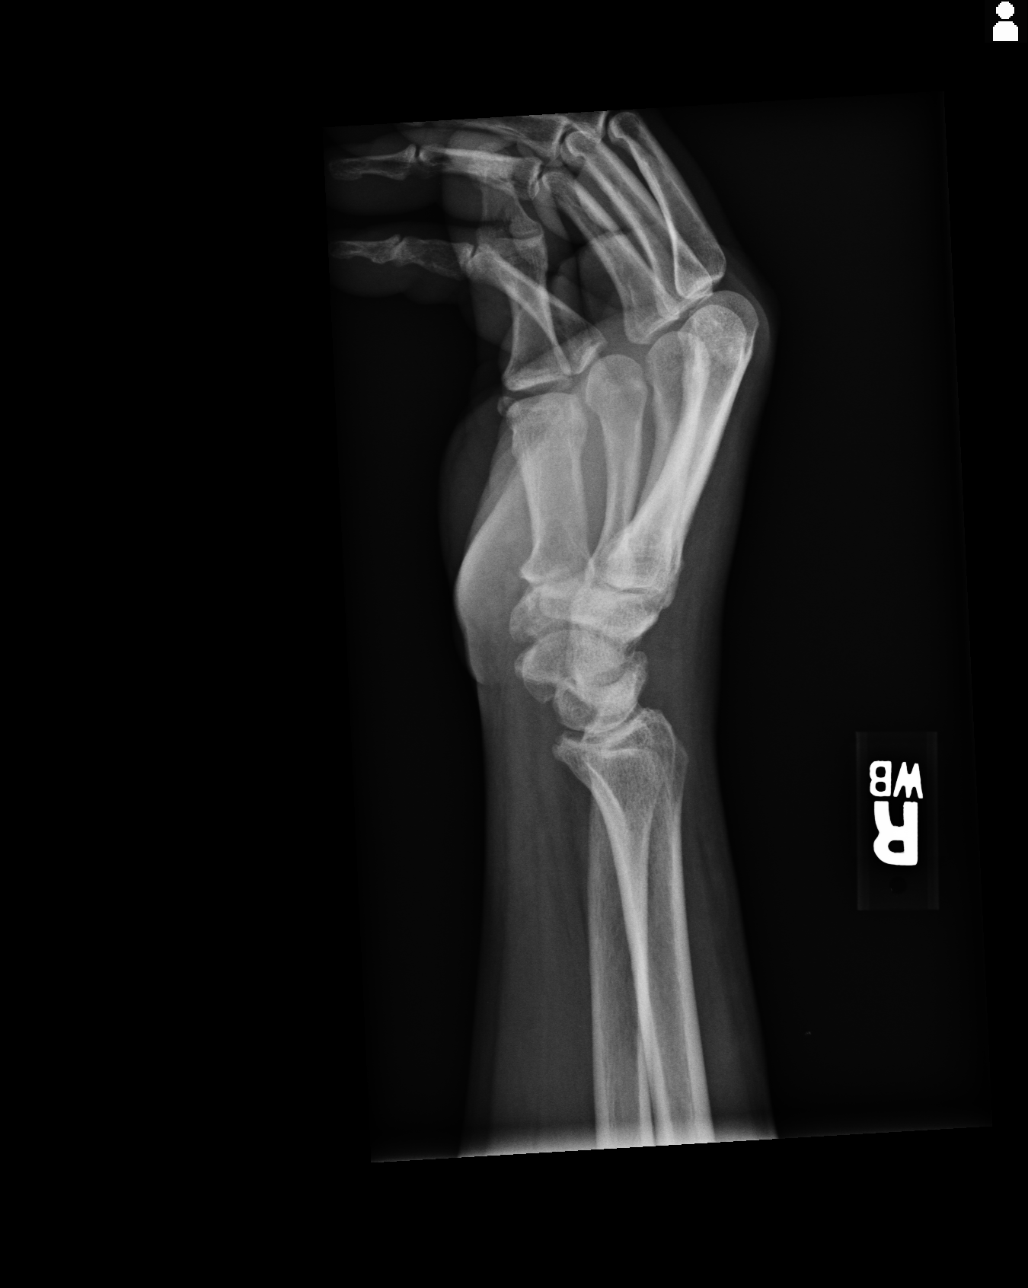
[im 4/4]
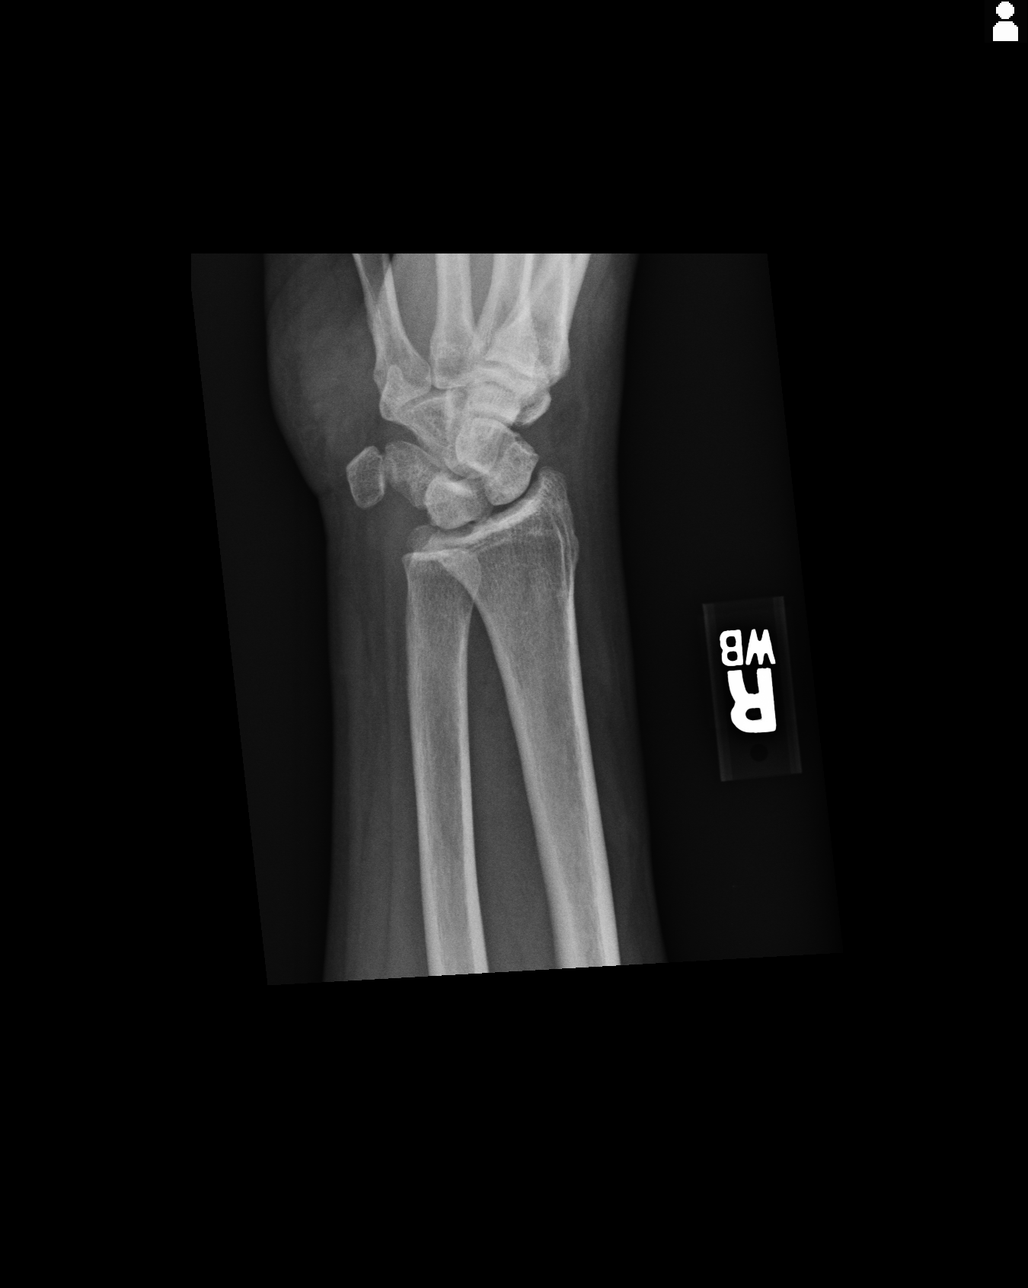

[4 of 4 positions shown; findings below may reference images not displayed]

FINDINGS: No acute fracture. Joint spaces are relatively preserved. There is normal
alignment.
IMPRESSION: No acute findings.

[REDACTED]

## 2013-09-27 ENCOUNTER — Ambulatory Visit: Payer: Self-pay | Admitting: Surgery

## 2013-10-09 ENCOUNTER — Emergency Department: Payer: Self-pay | Admitting: Emergency Medicine

## 2013-10-16 ENCOUNTER — Emergency Department: Payer: Self-pay | Admitting: Emergency Medicine

## 2013-10-20 ENCOUNTER — Emergency Department: Payer: Self-pay | Admitting: Emergency Medicine

## 2013-10-31 ENCOUNTER — Emergency Department: Payer: Self-pay | Admitting: Emergency Medicine

## 2013-10-31 LAB — CBC WITH DIFFERENTIAL/PLATELET
Basophil #: 0.1 10*3/uL (ref 0.0–0.1)
Basophil %: 0.5 %
Eosinophil #: 0.1 10*3/uL (ref 0.0–0.7)
Eosinophil %: 0.8 %
HCT: 42.6 % (ref 40.0–52.0)
HGB: 13.9 g/dL (ref 13.0–18.0)
Lymphocyte #: 2.3 10*3/uL (ref 1.0–3.6)
Lymphocyte %: 18.4 %
MCH: 28 pg (ref 26.0–34.0)
MCHC: 32.7 g/dL (ref 32.0–36.0)
MCV: 86 fL (ref 80–100)
Monocyte #: 1.4 x10 3/mm — ABNORMAL HIGH (ref 0.2–1.0)
Monocyte %: 11.3 %
Neutrophil #: 8.5 10*3/uL — ABNORMAL HIGH (ref 1.4–6.5)
Neutrophil %: 69 %
Platelet: 283 10*3/uL (ref 150–440)
RBC: 4.98 10*6/uL (ref 4.40–5.90)
RDW: 15.3 % — ABNORMAL HIGH (ref 11.5–14.5)
WBC: 12.3 10*3/uL — ABNORMAL HIGH (ref 3.8–10.6)

## 2013-10-31 LAB — BASIC METABOLIC PANEL
Anion Gap: 4 — ABNORMAL LOW (ref 7–16)
BUN: 12 mg/dL (ref 7–18)
Calcium, Total: 10 mg/dL (ref 8.5–10.1)
Chloride: 104 mmol/L (ref 98–107)
Co2: 28 mmol/L (ref 21–32)
Creatinine: 1.03 mg/dL (ref 0.60–1.30)
EGFR (African American): >60
EGFR (Non-African Amer.): >60
Glucose: 109 mg/dL — ABNORMAL HIGH (ref 65–99)
Osmolality: 272 (ref 275–301)
Potassium: 3.7 mmol/L (ref 3.5–5.1)
Sodium: 136 mmol/L (ref 136–145)

## 2013-10-31 LAB — URIC ACID: Uric Acid: 8.2 mg/dL — ABNORMAL HIGH (ref 3.5–7.2)

## 2013-11-20 ENCOUNTER — Emergency Department: Payer: Self-pay | Admitting: Emergency Medicine

## 2013-11-20 LAB — URIC ACID: Uric Acid: 7.4 mg/dL — ABNORMAL HIGH (ref 3.5–7.2)

## 2013-12-12 ENCOUNTER — Ambulatory Visit: Payer: Self-pay | Admitting: Gastroenterology

## 2013-12-13 ENCOUNTER — Observation Stay: Payer: Self-pay | Admitting: Internal Medicine

## 2013-12-13 LAB — BASIC METABOLIC PANEL
Anion Gap: 6 — ABNORMAL LOW (ref 7–16)
BUN: 12 mg/dL (ref 7–18)
Calcium, Total: 8.7 mg/dL (ref 8.5–10.1)
Chloride: 104 mmol/L (ref 98–107)
Co2: 25 mmol/L (ref 21–32)
Creatinine: 1.09 mg/dL (ref 0.60–1.30)
EGFR (African American): >60
EGFR (Non-African Amer.): >60
Glucose: 125 mg/dL — ABNORMAL HIGH (ref 65–99)
Osmolality: 271 (ref 275–301)
Potassium: 3.7 mmol/L (ref 3.5–5.1)
Sodium: 135 mmol/L — ABNORMAL LOW (ref 136–145)

## 2013-12-13 LAB — SYNOVIAL CELL COUNT + DIFF, W/ CRYSTALS
Basophil: 0 %
Eosinophil: 0 %
Lymphocytes: 4 %
Neutrophils: 82 %
Nucleated Cell Count: 20089 /mm3
Other Cells BF: 0 %
Other Mononuclear Cells: 14 %

## 2013-12-13 LAB — CBC WITH DIFFERENTIAL/PLATELET
Comment - H1-Com3: NORMAL
HCT: 40 % (ref 40.0–52.0)
HGB: 13.2 g/dL (ref 13.0–18.0)
Lymphocytes: 5 %
MCH: 27.8 pg (ref 26.0–34.0)
MCHC: 33 g/dL (ref 32.0–36.0)
MCV: 84 fL (ref 80–100)
Monocytes: 11 %
Platelet: 197 10*3/uL (ref 150–440)
RBC: 4.75 10*6/uL (ref 4.40–5.90)
RDW: 15.8 % — ABNORMAL HIGH (ref 11.5–14.5)
Segmented Neutrophils: 83 %
Variant Lymphocyte - H1-Rlymph: 1 %
WBC: 15 10*3/uL — ABNORMAL HIGH (ref 3.8–10.6)

## 2013-12-13 LAB — PATHOLOGY REPORT

## 2013-12-13 LAB — URIC ACID: Uric Acid: 8 mg/dL — ABNORMAL HIGH (ref 3.5–7.2)

## 2013-12-13 LAB — SEDIMENTATION RATE: Erythrocyte Sed Rate: 14 mm/hr (ref 0–20)

## 2013-12-13 IMAGING — CR DG CHEST 2V
1 series · 2 of 2 positions shown · non-contrast
Comparison: none

REASON FOR EXAM: shortness of breath
COMMENTS:   LMP: (Male)

PROCEDURE:     DXR - DXR CHEST PA (OR AP) AND LATERAL  - March 14, 2013 [DATE]
RESULT:     Comparison: None

[Series 1: w chest pa · 0.14mm/px · 2 of 2 slices shown]
[im 1/2]
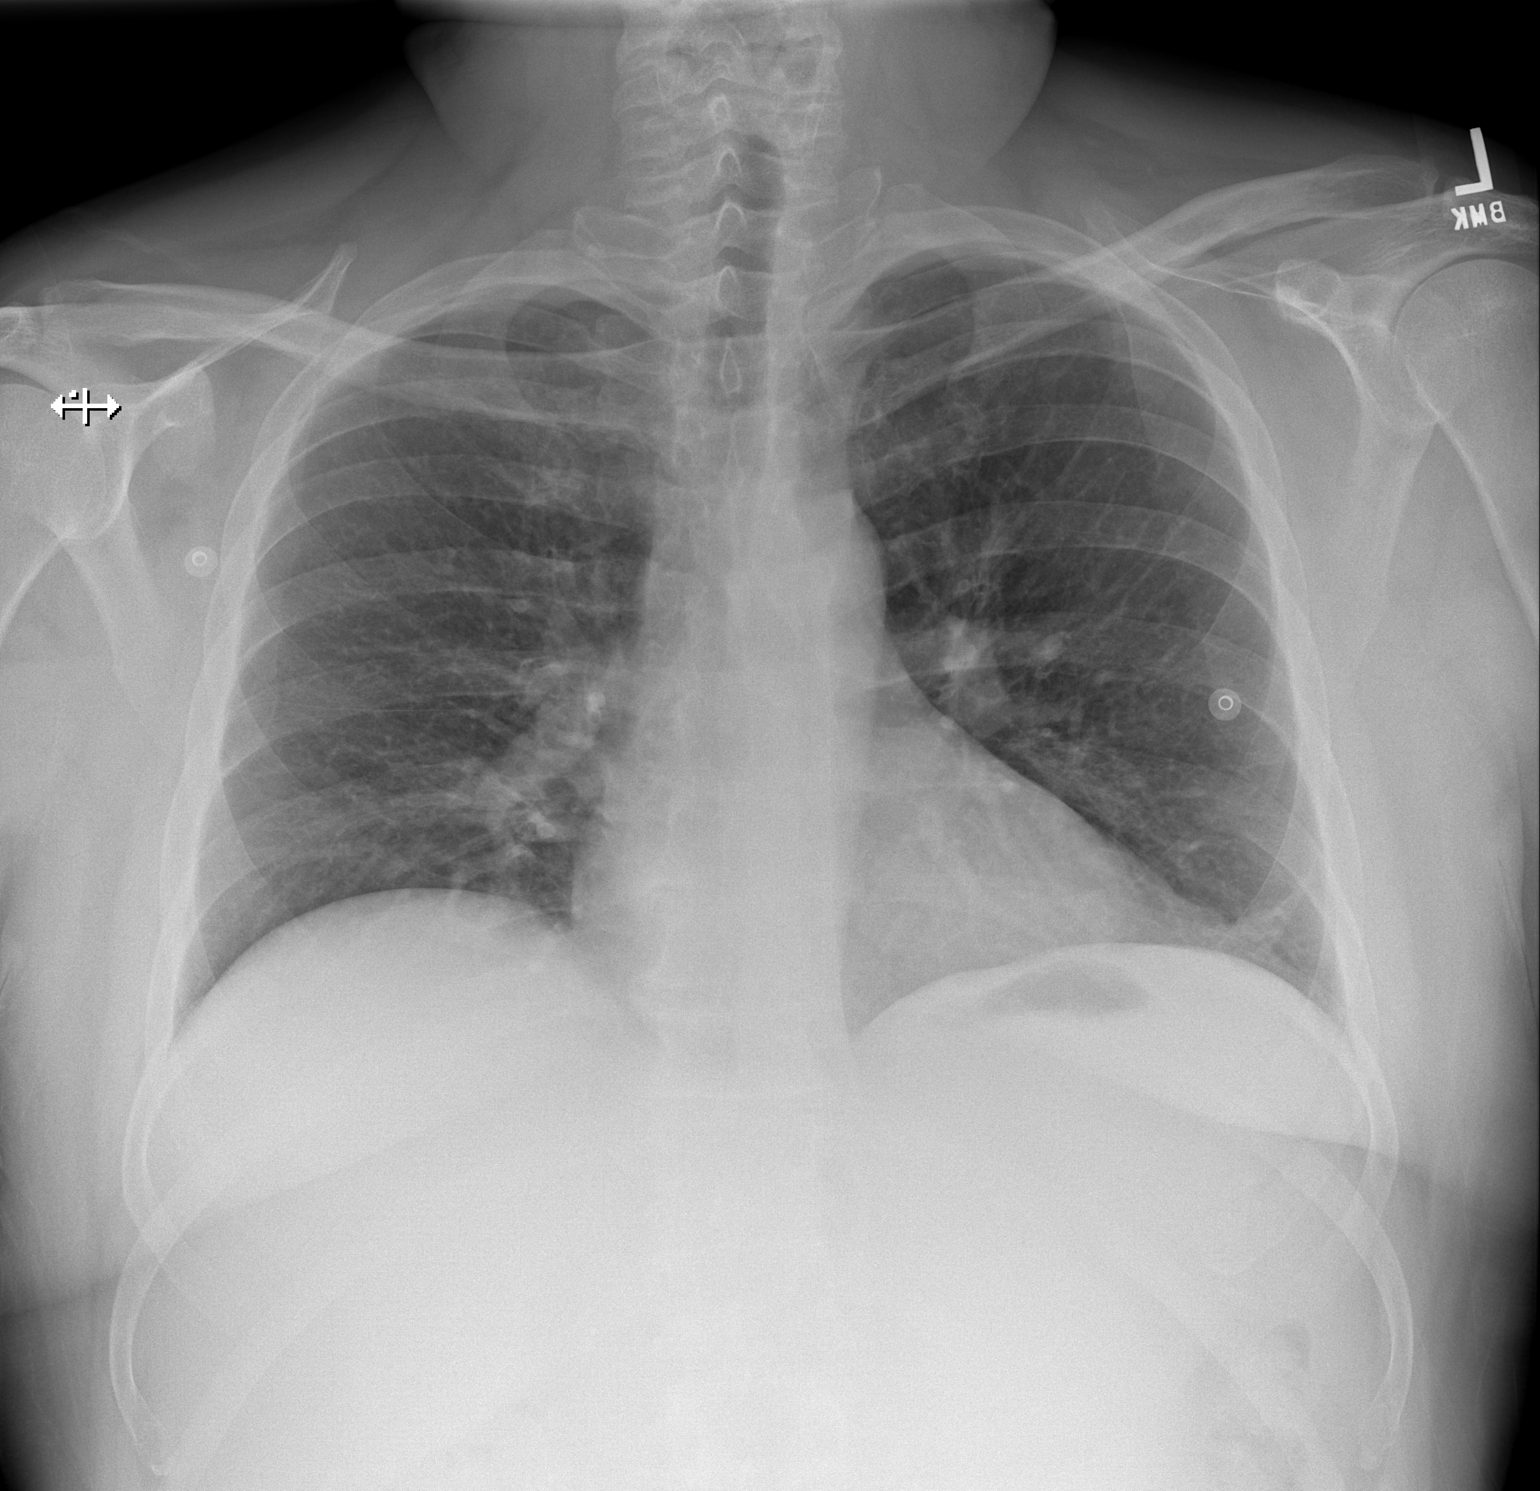
[im 2/2]
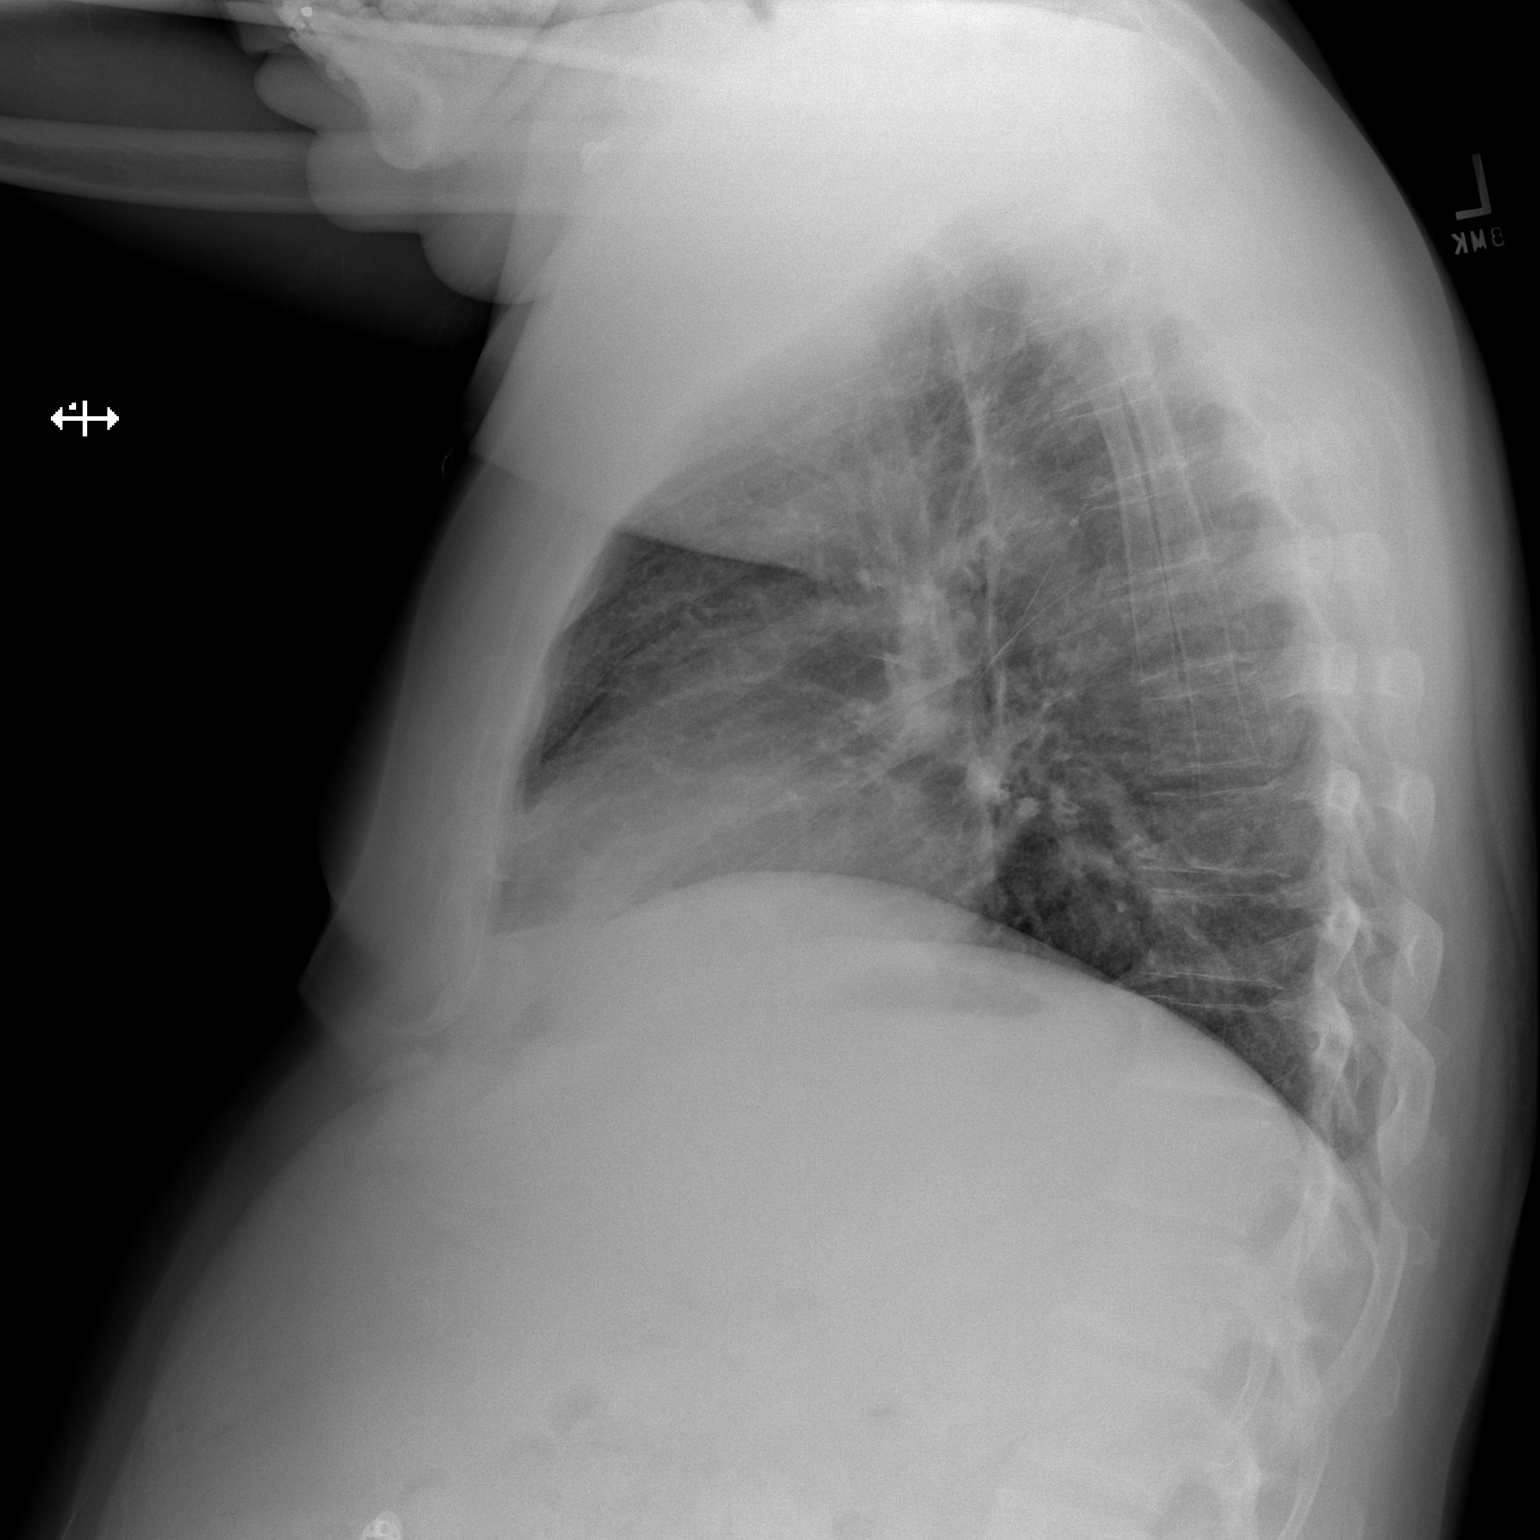

[2 of 2 positions shown; findings below may reference images not displayed]

FINDINGS: PA and lateral chest radiographs are provided.  There is no focal
parenchymal opacity, pleural effusion, or pneumothorax. The heart and
mediastinum are unremarkable.  The osseous structures are unremarkable.
IMPRESSION: No acute disease of the che[REDACTED]

## 2013-12-14 IMAGING — US ABDOMEN ULTRASOUND LIMITED
1 series · 14 of 25 positions shown · non-contrast
Comparison: none

REASON FOR EXAM: RUQ pain vomiting
COMMENTS:   Body Site: GB and Fossa, CBD, Head of Pancreas

[Series 1: abdomen ultrasound limited · 0.31mm/px · 14 of 55 slices shown]
[im 1/55]
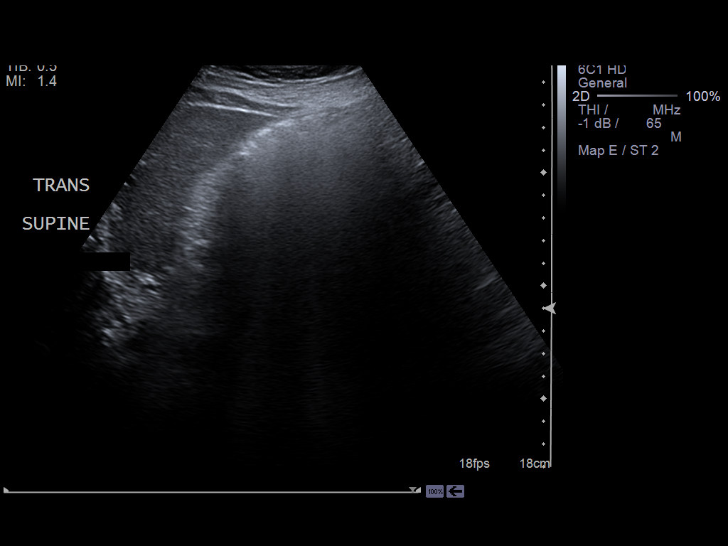
[im 5/55]
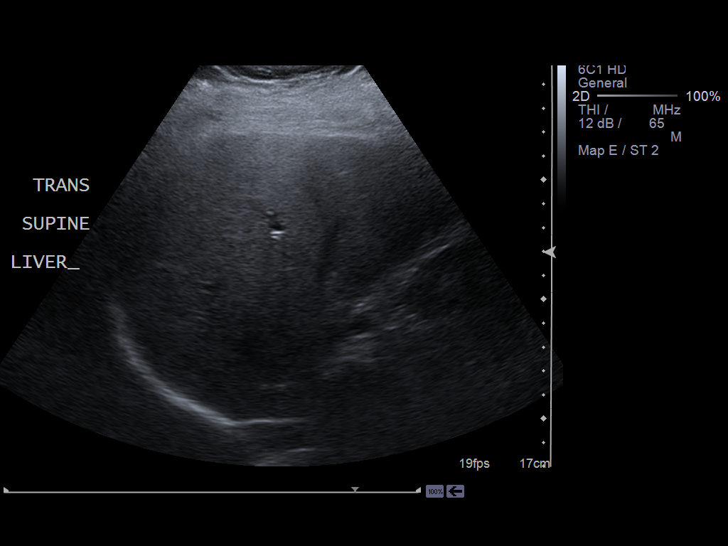
[im 10/55]
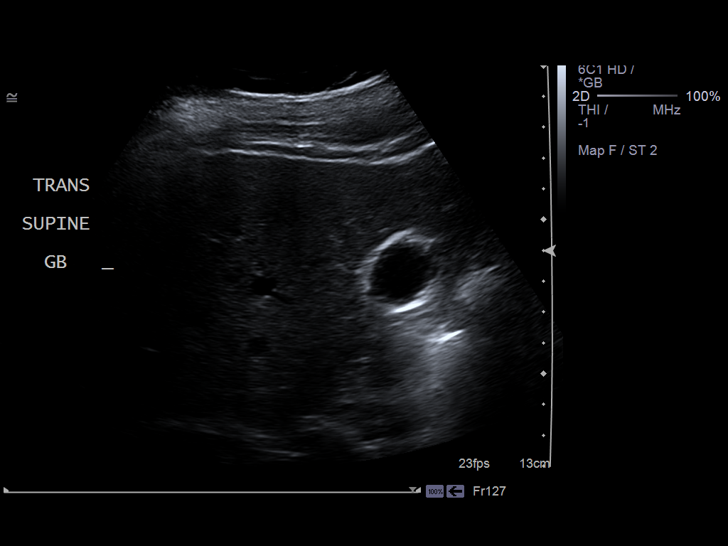
[im 14/55]
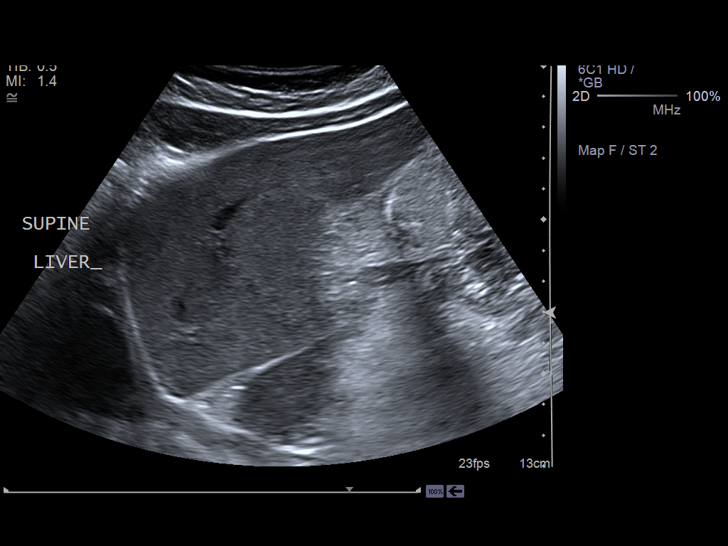
[im 19/55]
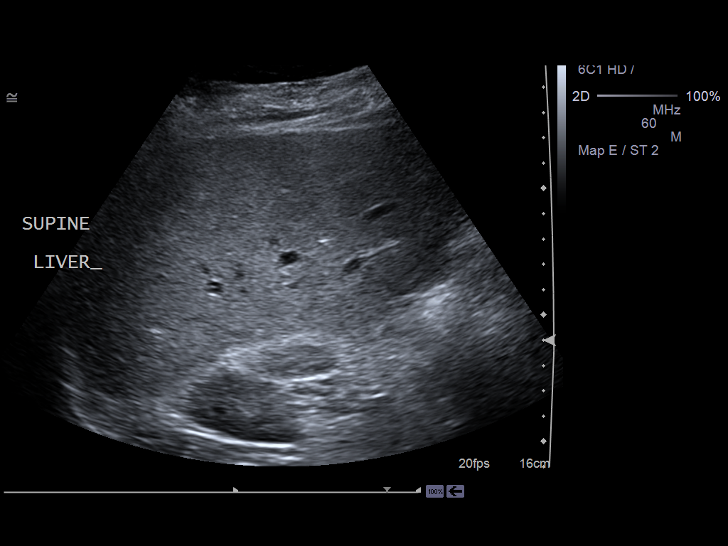
[im 21/55]
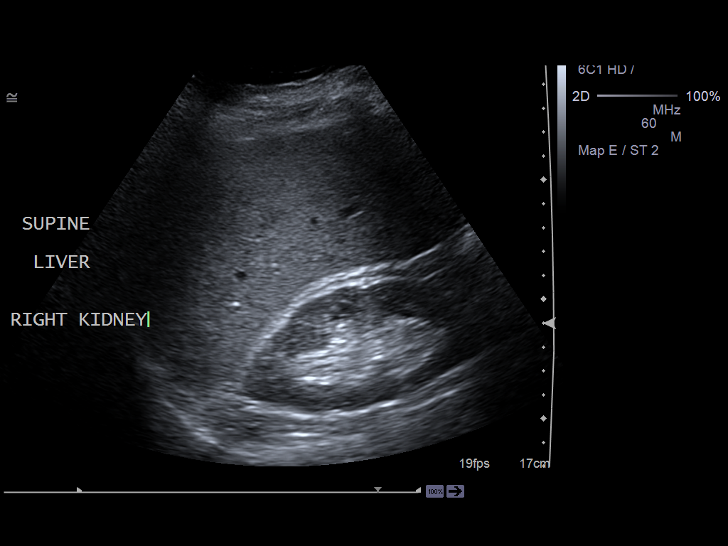
[im 25/55]
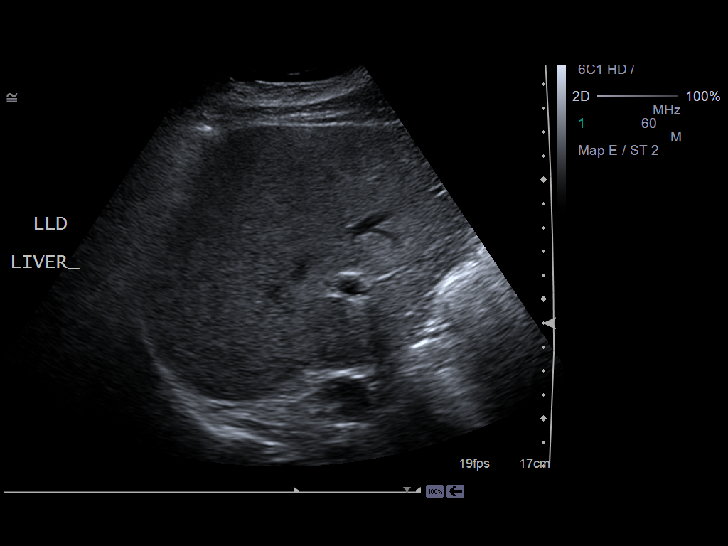
[im 30/55]
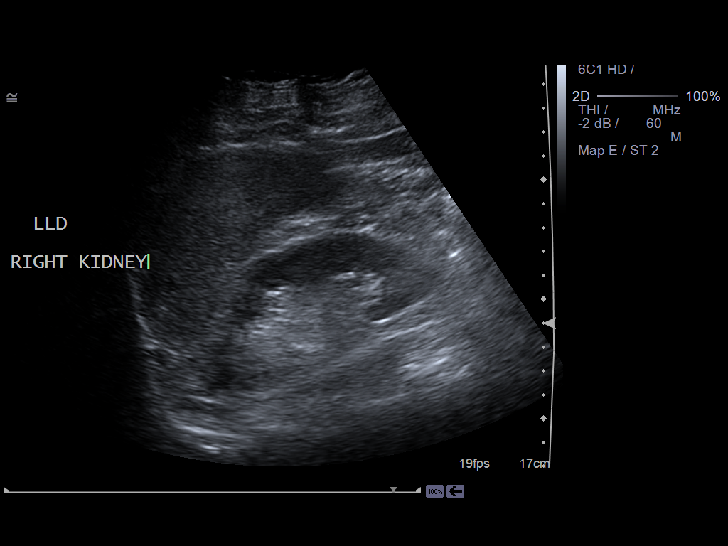
[im 34/55]
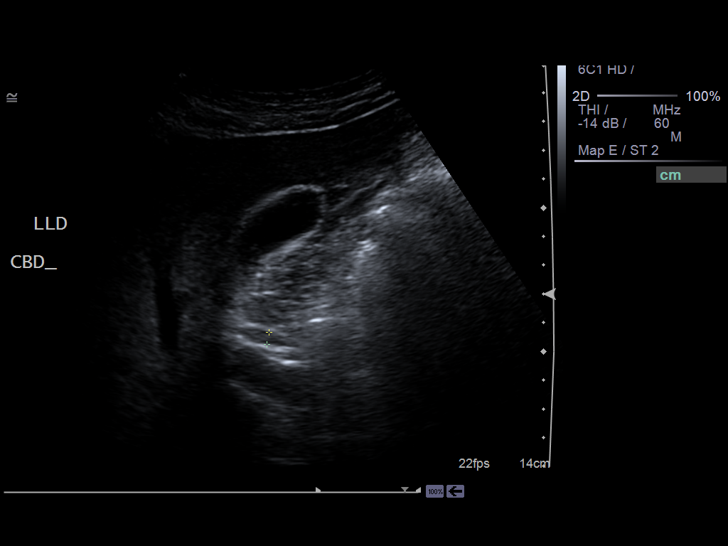
[im 37/55]
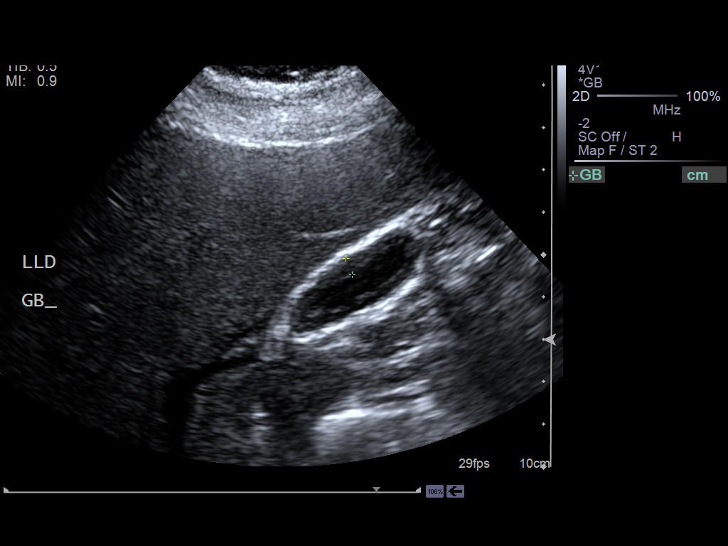
[im 41/55]
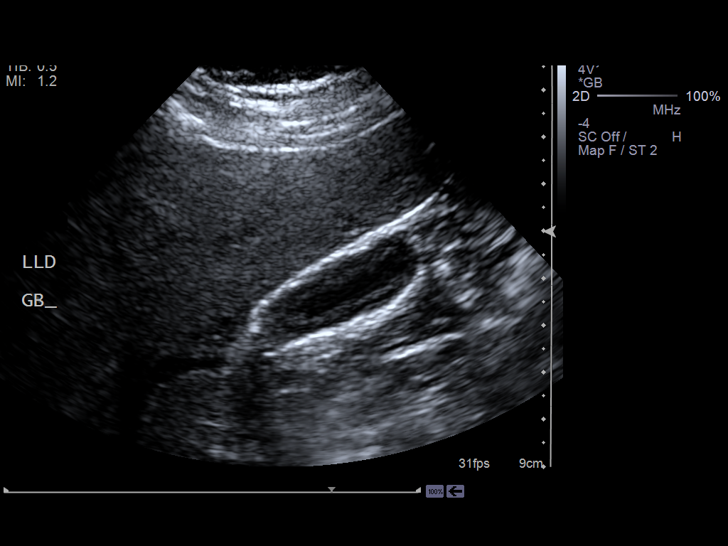
[im 46/55]
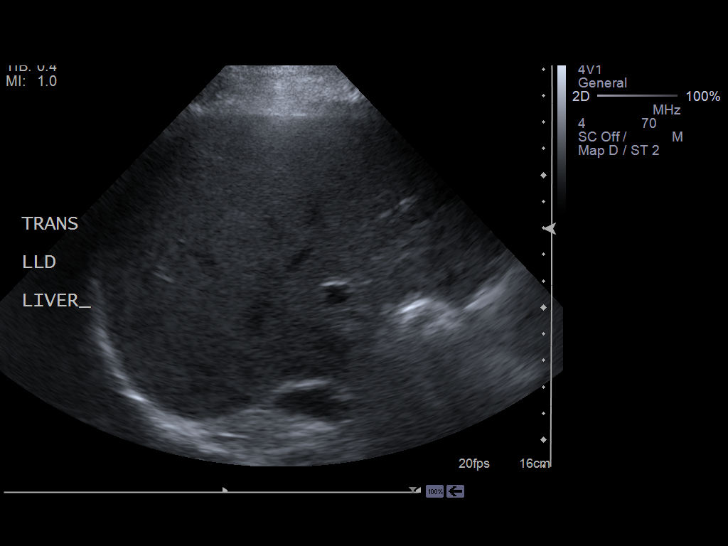
[im 50/55]
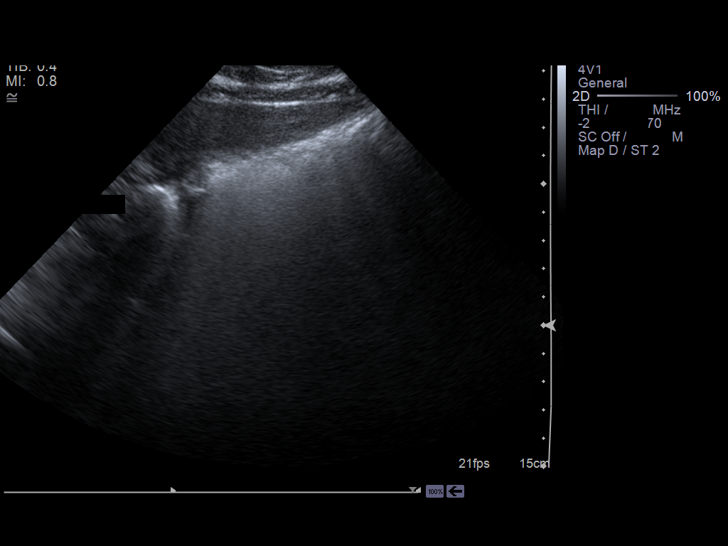
[im 55/55]
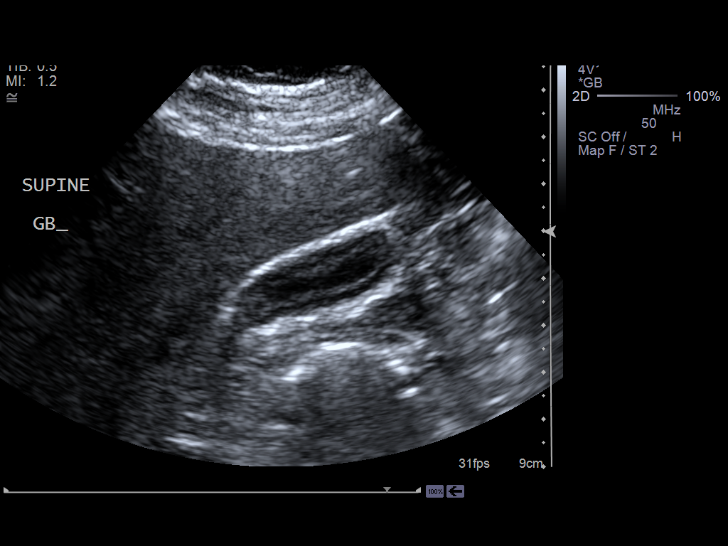

[14 of 25 positions shown; findings below may reference images not displayed]

PROCEDURE:     US  - US ABDOMEN LIMITED SURVEY  - March 15, 2013  [DATE]

RESULT:     A limited right upper quadrant ultrasound was performed.

The gallbladder is only partially distended exhibits wall thickening to
cm. No stones are evident. There is no pericholecystic fluid. There is no
positive sonographic Murphy's sign. The common bile duct is normal at 4.3 mm
in diameter.

The liver is normal in echotexture and contour. There is no intrahepatic
ductal dilation. Portal venous flow is normal in direction toward the liver.
The pancreas was obscured by bowel gas.
IMPRESSION: 1. There is gallbladder is only partially distended and exhibits subjective
wall thickening which may reflect chronic cholecystitis or may merely be
related to under distention. There is no sonographic Murphy's sign nor
evidence of gallstones.
2. No acute abnormality of the liver is demonstrated.

A preliminary report was sent to the [HOSPITAL] the conclusion
of the study.

[REDACTED]

## 2013-12-14 IMAGING — CT CT ABD-PELV W/ CM
1 of 2 series · 15 of 32 positions shown, 19 images · non-contrast
Comparison: none

REASON FOR EXAM: (1) RUQ/RLQ pain vomiting; (2) RUQ/RLQ pain vomiting
COMMENTS:

[Series 2: 3mm soft tissue · axial · 0.83mm/px · z∈[-541,-94]mm · 15 of 163 slices shown, 19 images]
[im 7/163  soft-tissue]
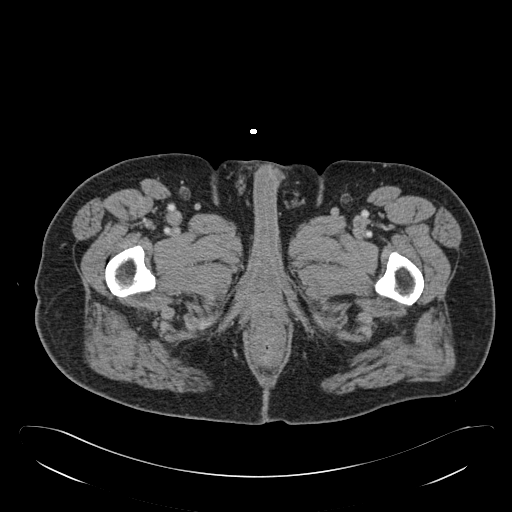
[im 7/163  bone]
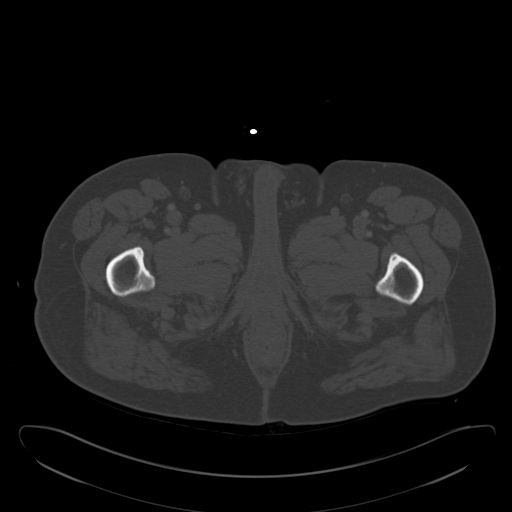
[im 20/163  soft-tissue]
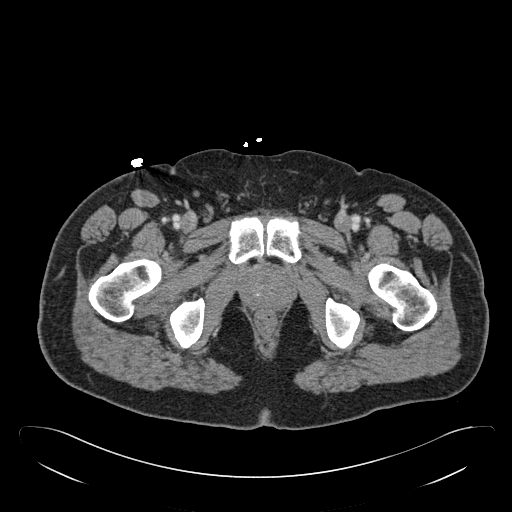
[im 33/163  soft-tissue]
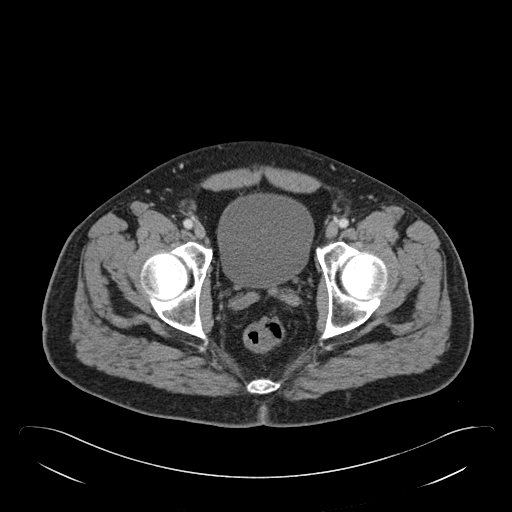
[im 46/163  soft-tissue]
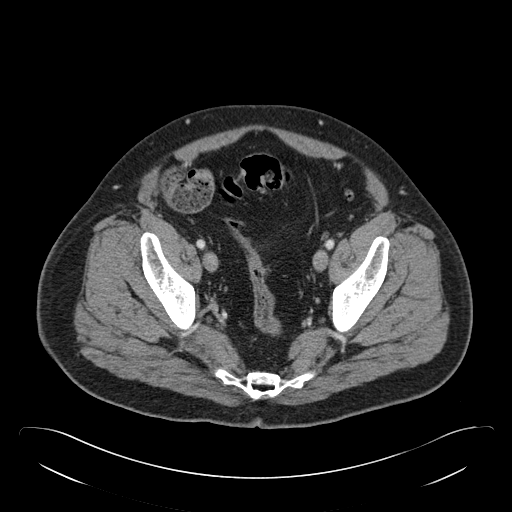
[im 59/163  soft-tissue]
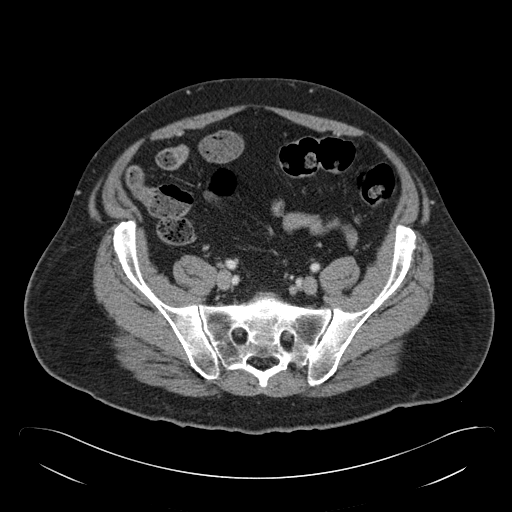
[im 72/163  soft-tissue]
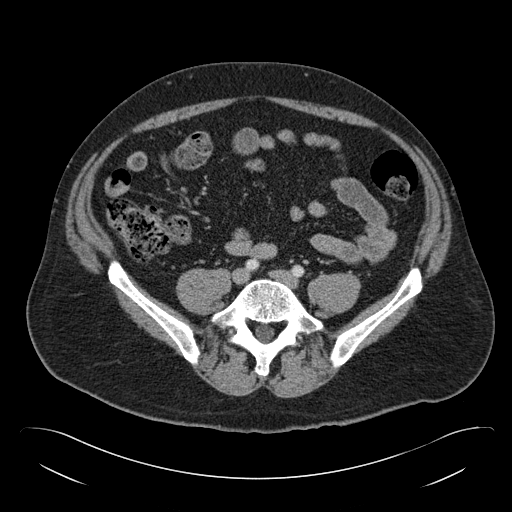
[im 85/163  soft-tissue]
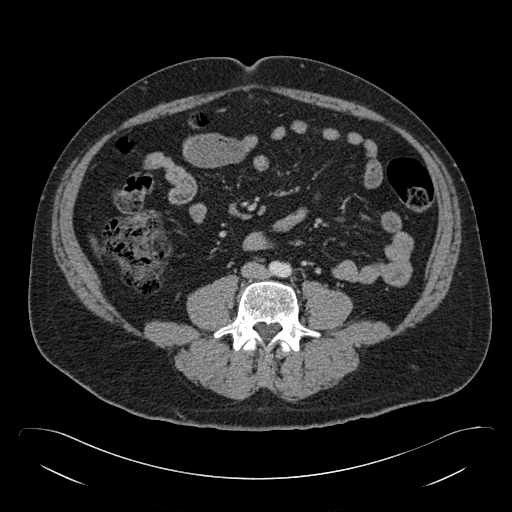
[im 91/163  soft-tissue]
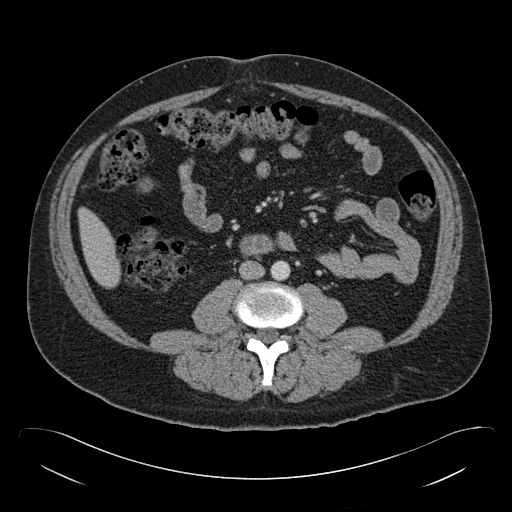
[im 104/163  soft-tissue]
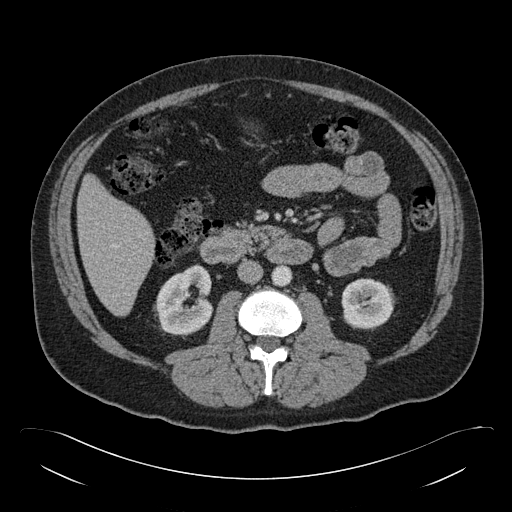
[im 104/163  bone]
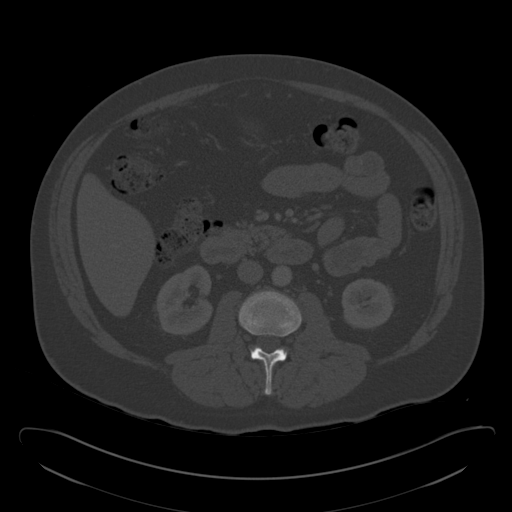
[im 117/163  soft-tissue]
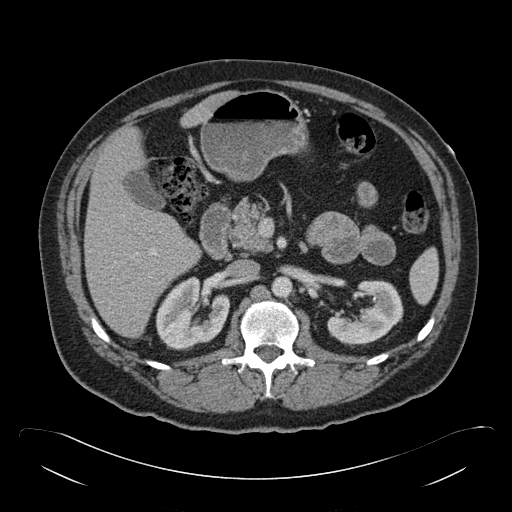
[im 130/163  soft-tissue]
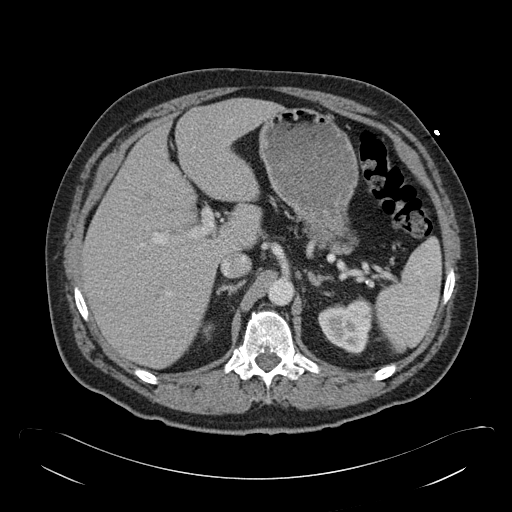
[im 137/163  lung]
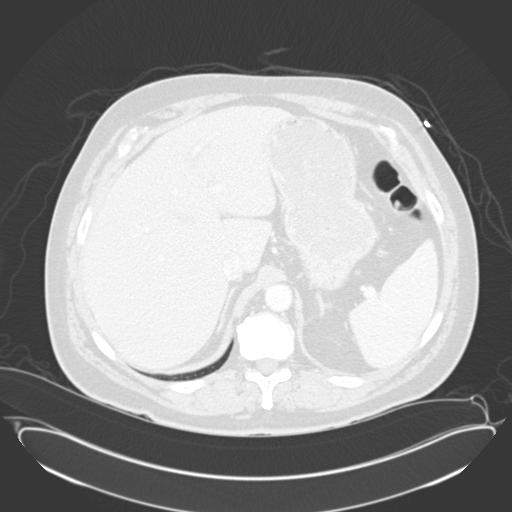
[im 143/163  soft-tissue]
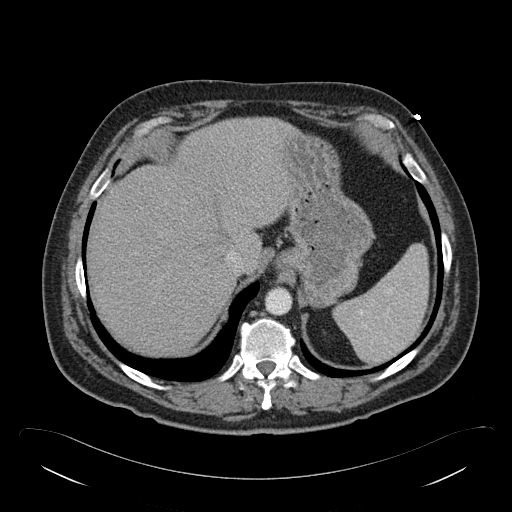
[im 143/163  lung]
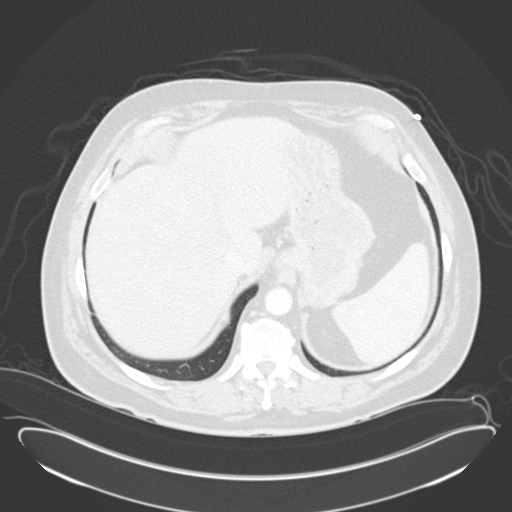
[im 150/163  lung]
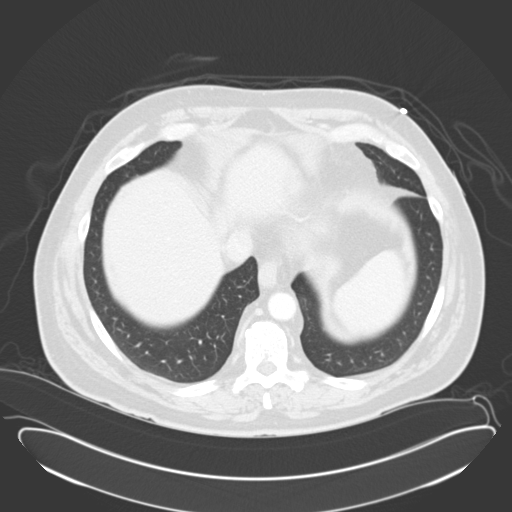
[im 156/163  soft-tissue]
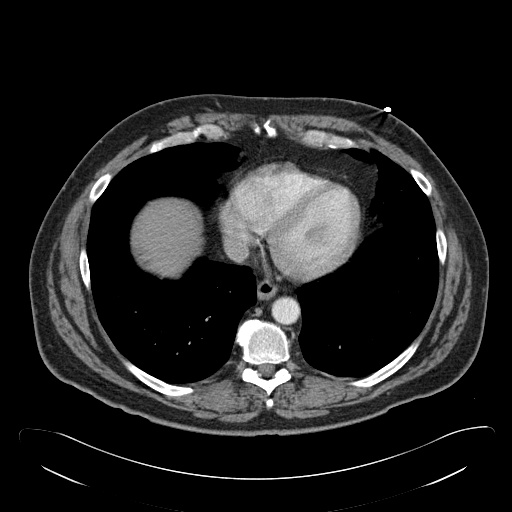
[im 156/163  lung]
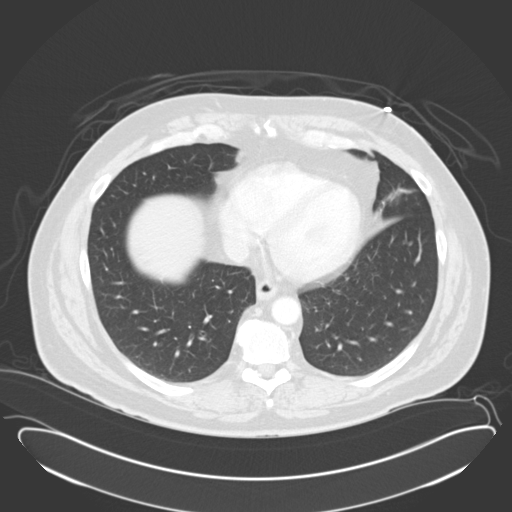

[15 of 32 positions shown; findings below may reference images not displayed]

PROCEDURE:     CT  - CT ABDOMEN / PELVIS  W  - March 15, 2013  [DATE]

RESULT:     Axial CT scanning was performed through the abdomen and pelvis
with reconstructions at 3 mm intervals and slice thicknesses following
intravenous administration of 1[REDACTED]. The patient did not receive
oral contrast material.

The liver exhibits no focal mass nor ductal dilation. The gallbladder is
adequately distended. The wall thickening demonstrated previously is not
evident on this study. There is no pericholecystic fluid. No stones are
evident. The pancreas, spleen, partially distended stomach, adrenal glands,
and kidneys are normal in appearance. The caliber of the abdominal aorta is
normal. The partially distended urinary bladder is normal in appearance. The
prostate gland and seminal vesicles also appear normal.

The unopacified loops of small and large bowel exhibit no evidence of an
ileus nor of obstruction. There is a moderate amount of retained stool in
the distal ileum as well as in the right colon. A normal appendix is
demonstrated. The psoas musculature is normal in density and contour. There
is no periaortic or pericaval lymphadenopathy nor intraperitoneal
lymphadenopathy. The lung bases are clear. The lumbar vertebral bodies are
preserved in height.
IMPRESSION: 1. The gallbladder is normal in appearance on the current study. The earlier
ultrasound findings of wall thickening may be related to underdistention.
2. No acute abnormality of the liver or pancreas or spleen is demonstrated.
3. No acute abnormality of the stomach or bowel is demonstrated.
4. There is no acute urinary tract abnormality.

A preliminary report was sent to the [HOSPITAL] the conclusion
of the study.

[REDACTED]

## 2013-12-17 LAB — BODY FLUID CULTURE

## 2013-12-27 ENCOUNTER — Emergency Department: Payer: Self-pay

## 2013-12-27 LAB — CBC
HCT: 40.3 % (ref 40.0–52.0)
HGB: 13.4 g/dL (ref 13.0–18.0)
MCH: 28.5 pg (ref 26.0–34.0)
MCHC: 33.2 g/dL (ref 32.0–36.0)
MCV: 86 fL (ref 80–100)
Platelet: 209 10*3/uL (ref 150–440)
RBC: 4.7 10*6/uL (ref 4.40–5.90)
RDW: 16.1 % — ABNORMAL HIGH (ref 11.5–14.5)
WBC: 8.7 10*3/uL (ref 3.8–10.6)

## 2013-12-27 LAB — URINALYSIS, COMPLETE
Bacteria: NONE SEEN
Bilirubin,UR: NEGATIVE
Blood: NEGATIVE
Glucose,UR: NEGATIVE mg/dL (ref 0–75)
Ketone: NEGATIVE
Nitrite: NEGATIVE
Ph: 5 (ref 4.5–8.0)
Protein: NEGATIVE
RBC,UR: <1 /HPF (ref 0–5)
Specific Gravity: 1.028 (ref 1.003–1.030)
Squamous Epithelial: 1
WBC UR: 2 /HPF (ref 0–5)

## 2013-12-27 LAB — COMPREHENSIVE METABOLIC PANEL
Albumin: 3.7 g/dL (ref 3.4–5.0)
Alkaline Phosphatase: 73 U/L
Anion Gap: 2 — ABNORMAL LOW (ref 7–16)
BUN: 15 mg/dL (ref 7–18)
Bilirubin,Total: 0.4 mg/dL (ref 0.2–1.0)
Calcium, Total: 9.2 mg/dL (ref 8.5–10.1)
Chloride: 106 mmol/L (ref 98–107)
Co2: 31 mmol/L (ref 21–32)
Creatinine: 0.97 mg/dL (ref 0.60–1.30)
EGFR (African American): >60
EGFR (Non-African Amer.): >60
Glucose: 94 mg/dL (ref 65–99)
Osmolality: 278 (ref 275–301)
Potassium: 3.8 mmol/L (ref 3.5–5.1)
SGOT(AST): 16 U/L (ref 15–37)
SGPT (ALT): 23 U/L (ref 12–78)
Sodium: 139 mmol/L (ref 136–145)
Total Protein: 6.6 g/dL (ref 6.4–8.2)

## 2013-12-27 LAB — LIPASE, BLOOD: Lipase: 173 U/L (ref 73–393)

## 2014-01-10 ENCOUNTER — Ambulatory Visit: Payer: Self-pay | Admitting: Surgery

## 2014-01-10 LAB — CBC WITH DIFFERENTIAL/PLATELET
Basophil #: 0 10*3/uL (ref 0.0–0.1)
Basophil %: 0.8 %
Eosinophil #: 0.2 10*3/uL (ref 0.0–0.7)
Eosinophil %: 2.4 %
HCT: 42 % (ref 40.0–52.0)
HGB: 13.8 g/dL (ref 13.0–18.0)
Lymphocyte #: 1.6 10*3/uL (ref 1.0–3.6)
Lymphocyte %: 26.5 %
MCH: 28.1 pg (ref 26.0–34.0)
MCHC: 33 g/dL (ref 32.0–36.0)
MCV: 85 fL (ref 80–100)
Monocyte #: 0.7 x10 3/mm (ref 0.2–1.0)
Monocyte %: 11 %
Neutrophil #: 3.7 10*3/uL (ref 1.4–6.5)
Neutrophil %: 59.3 %
Platelet: 230 10*3/uL (ref 150–440)
RBC: 4.93 10*6/uL (ref 4.40–5.90)
RDW: 16.8 % — ABNORMAL HIGH (ref 11.5–14.5)
WBC: 6.2 10*3/uL (ref 3.8–10.6)

## 2014-01-10 LAB — HEPATIC FUNCTION PANEL A (ARMC)
Albumin: 3.8 g/dL (ref 3.4–5.0)
Alkaline Phosphatase: 88 U/L
Bilirubin, Direct: 0.1 mg/dL (ref 0.00–0.20)
Bilirubin,Total: 0.5 mg/dL (ref 0.2–1.0)
SGOT(AST): 15 U/L (ref 15–37)
SGPT (ALT): 29 U/L (ref 12–78)
Total Protein: 6.9 g/dL (ref 6.4–8.2)

## 2014-01-18 ENCOUNTER — Ambulatory Visit: Payer: Self-pay | Admitting: Surgery

## 2014-01-19 LAB — PATHOLOGY REPORT

## 2014-01-23 ENCOUNTER — Observation Stay: Payer: Self-pay | Admitting: Surgery

## 2014-01-23 LAB — TROPONIN I
Troponin-I: <0.02 ng/mL
Troponin-I: <0.02 ng/mL

## 2014-01-23 LAB — CBC WITH DIFFERENTIAL/PLATELET
Basophil #: 0 10*3/uL (ref 0.0–0.1)
Basophil %: 0.3 %
Eosinophil #: 0.1 10*3/uL (ref 0.0–0.7)
Eosinophil %: 0.6 %
HCT: 44.4 % (ref 40.0–52.0)
HGB: 14.2 g/dL (ref 13.0–18.0)
Lymphocyte #: 2.2 10*3/uL (ref 1.0–3.6)
Lymphocyte %: 17.2 %
MCH: 27.1 pg (ref 26.0–34.0)
MCHC: 32 g/dL (ref 32.0–36.0)
MCV: 85 fL (ref 80–100)
Monocyte #: 2.2 x10 3/mm — ABNORMAL HIGH (ref 0.2–1.0)
Monocyte %: 17.3 %
Neutrophil #: 8.2 10*3/uL — ABNORMAL HIGH (ref 1.4–6.5)
Neutrophil %: 64.6 %
Platelet: 282 10*3/uL (ref 150–440)
RBC: 5.24 10*6/uL (ref 4.40–5.90)
RDW: 17.1 % — ABNORMAL HIGH (ref 11.5–14.5)
WBC: 12.7 10*3/uL — ABNORMAL HIGH (ref 3.8–10.6)

## 2014-01-23 LAB — COMPREHENSIVE METABOLIC PANEL
Albumin: 3.8 g/dL (ref 3.4–5.0)
Alkaline Phosphatase: 101 U/L
Anion Gap: 7 (ref 7–16)
BUN: 10 mg/dL (ref 7–18)
Bilirubin,Total: 1.3 mg/dL — ABNORMAL HIGH (ref 0.2–1.0)
Calcium, Total: 9.1 mg/dL (ref 8.5–10.1)
Chloride: 108 mmol/L — ABNORMAL HIGH (ref 98–107)
Co2: 27 mmol/L (ref 21–32)
Creatinine: 0.96 mg/dL (ref 0.60–1.30)
EGFR (African American): >60
EGFR (Non-African Amer.): >60
Glucose: 104 mg/dL — ABNORMAL HIGH (ref 65–99)
Osmolality: 282 (ref 275–301)
Potassium: 3.3 mmol/L — ABNORMAL LOW (ref 3.5–5.1)
SGOT(AST): 13 U/L — ABNORMAL LOW (ref 15–37)
SGPT (ALT): 23 U/L (ref 12–78)
Sodium: 142 mmol/L (ref 136–145)
Total Protein: 7.6 g/dL (ref 6.4–8.2)

## 2014-01-23 LAB — URINALYSIS, COMPLETE
Bacteria: NONE SEEN
Glucose,UR: NEGATIVE mg/dL (ref 0–75)
Hyaline Cast: 2
Nitrite: NEGATIVE
Ph: 6 (ref 4.5–8.0)
Protein: 30
RBC,UR: 3 /HPF (ref 0–5)
Specific Gravity: 1.023 (ref 1.003–1.030)
Squamous Epithelial: 2
WBC UR: 11 /HPF (ref 0–5)

## 2014-01-23 LAB — LIPASE, BLOOD: Lipase: 89 U/L (ref 73–393)

## 2014-01-23 LAB — PROTIME-INR
INR: 1
Prothrombin Time: 13.4 secs (ref 11.5–14.7)

## 2014-01-23 LAB — APTT: Activated PTT: 26.8 secs (ref 23.6–35.9)

## 2014-01-25 LAB — URINE CULTURE

## 2014-01-30 ENCOUNTER — Observation Stay: Payer: Self-pay | Admitting: Surgery

## 2014-01-30 LAB — URINALYSIS, COMPLETE
Bacteria: NONE SEEN
Bilirubin,UR: NEGATIVE
Blood: NEGATIVE
Glucose,UR: NEGATIVE mg/dL (ref 0–75)
Ketone: NEGATIVE
Nitrite: NEGATIVE
Ph: 6 (ref 4.5–8.0)
Protein: NEGATIVE
RBC,UR: 2 /HPF (ref 0–5)
Specific Gravity: 1.011 (ref 1.003–1.030)
Squamous Epithelial: <1
WBC UR: 5 /HPF (ref 0–5)

## 2014-01-30 LAB — COMPREHENSIVE METABOLIC PANEL
Albumin: 3.8 g/dL (ref 3.4–5.0)
Alkaline Phosphatase: 80 U/L
Anion Gap: 6 — ABNORMAL LOW (ref 7–16)
BUN: 8 mg/dL (ref 7–18)
Bilirubin,Total: 0.6 mg/dL (ref 0.2–1.0)
Calcium, Total: 9.2 mg/dL (ref 8.5–10.1)
Chloride: 105 mmol/L (ref 98–107)
Co2: 26 mmol/L (ref 21–32)
Creatinine: 0.97 mg/dL (ref 0.60–1.30)
EGFR (African American): >60
EGFR (Non-African Amer.): >60
Glucose: 104 mg/dL — ABNORMAL HIGH (ref 65–99)
Osmolality: 272 (ref 275–301)
Potassium: 3.7 mmol/L (ref 3.5–5.1)
SGOT(AST): 14 U/L — ABNORMAL LOW (ref 15–37)
SGPT (ALT): 19 U/L (ref 12–78)
Sodium: 137 mmol/L (ref 136–145)
Total Protein: 7.4 g/dL (ref 6.4–8.2)

## 2014-01-30 LAB — CBC
HCT: 44.8 % (ref 40.0–52.0)
HGB: 14.8 g/dL (ref 13.0–18.0)
MCH: 27.9 pg (ref 26.0–34.0)
MCHC: 32.9 g/dL (ref 32.0–36.0)
MCV: 85 fL (ref 80–100)
Platelet: 252 10*3/uL (ref 150–440)
RBC: 5.3 10*6/uL (ref 4.40–5.90)
RDW: 17.1 % — ABNORMAL HIGH (ref 11.5–14.5)
WBC: 7.2 10*3/uL (ref 3.8–10.6)

## 2014-01-30 LAB — D-DIMER(ARMC): D-Dimer: 439 ng/ml

## 2014-01-30 LAB — LIPASE, BLOOD: Lipase: 165 U/L (ref 73–393)

## 2014-01-31 LAB — COMPREHENSIVE METABOLIC PANEL
Albumin: 3.1 g/dL — ABNORMAL LOW (ref 3.4–5.0)
Alkaline Phosphatase: 69 U/L
Anion Gap: 3 — ABNORMAL LOW (ref 7–16)
BUN: 7 mg/dL (ref 7–18)
Bilirubin,Total: 0.7 mg/dL (ref 0.2–1.0)
Calcium, Total: 8.5 mg/dL (ref 8.5–10.1)
Chloride: 106 mmol/L (ref 98–107)
Co2: 32 mmol/L (ref 21–32)
Creatinine: 1.12 mg/dL (ref 0.60–1.30)
EGFR (African American): >60
EGFR (Non-African Amer.): >60
Glucose: 107 mg/dL — ABNORMAL HIGH (ref 65–99)
Osmolality: 280 (ref 275–301)
Potassium: 3.7 mmol/L (ref 3.5–5.1)
SGOT(AST): 12 U/L — ABNORMAL LOW (ref 15–37)
SGPT (ALT): 16 U/L (ref 12–78)
Sodium: 141 mmol/L (ref 136–145)
Total Protein: 6 g/dL — ABNORMAL LOW (ref 6.4–8.2)

## 2014-01-31 LAB — CBC WITH DIFFERENTIAL/PLATELET
Basophil #: 0 10*3/uL (ref 0.0–0.1)
Basophil %: 0.8 %
Eosinophil #: 0.1 10*3/uL (ref 0.0–0.7)
Eosinophil %: 2.3 %
HCT: 40.4 % (ref 40.0–52.0)
HGB: 13.3 g/dL (ref 13.0–18.0)
Lymphocyte #: 2.1 10*3/uL (ref 1.0–3.6)
Lymphocyte %: 34.1 %
MCH: 28.2 pg (ref 26.0–34.0)
MCHC: 32.9 g/dL (ref 32.0–36.0)
MCV: 86 fL (ref 80–100)
Monocyte #: 0.9 x10 3/mm (ref 0.2–1.0)
Monocyte %: 14.5 %
Neutrophil #: 2.9 10*3/uL (ref 1.4–6.5)
Neutrophil %: 48.3 %
Platelet: 202 10*3/uL (ref 150–440)
RBC: 4.71 10*6/uL (ref 4.40–5.90)
RDW: 17.1 % — ABNORMAL HIGH (ref 11.5–14.5)
WBC: 6 10*3/uL (ref 3.8–10.6)

## 2014-01-31 LAB — LIPASE, BLOOD: Lipase: 97 U/L (ref 73–393)

## 2014-02-10 IMAGING — CR DG CHEST 2V
1 series · 2 of 2 positions shown · non-contrast
Comparison: none

REASON FOR EXAM: Chest Pain
COMMENTS:

[Series 1: w chest pa · 0.14mm/px · 2 of 2 slices shown]
[im 1/2]
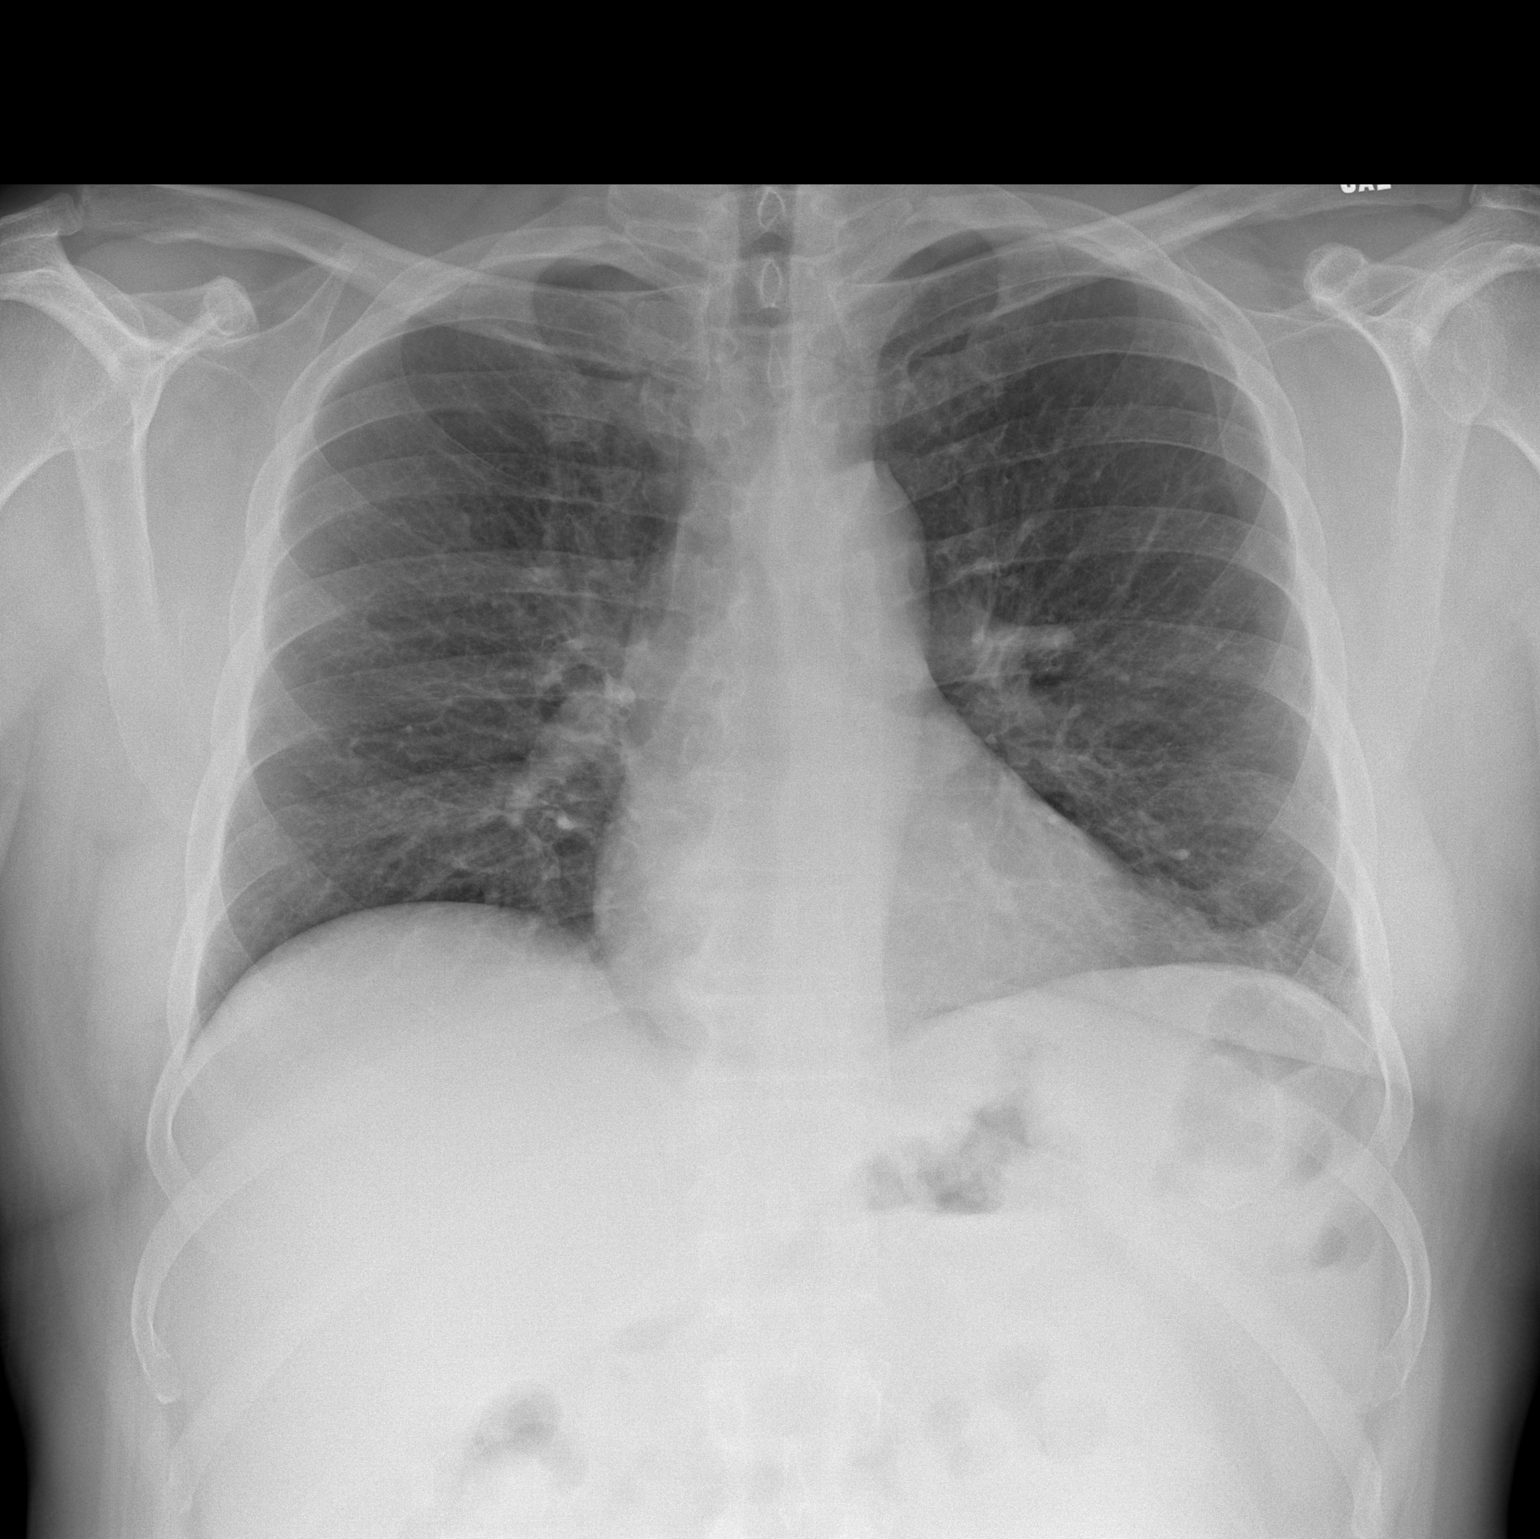
[im 2/2]
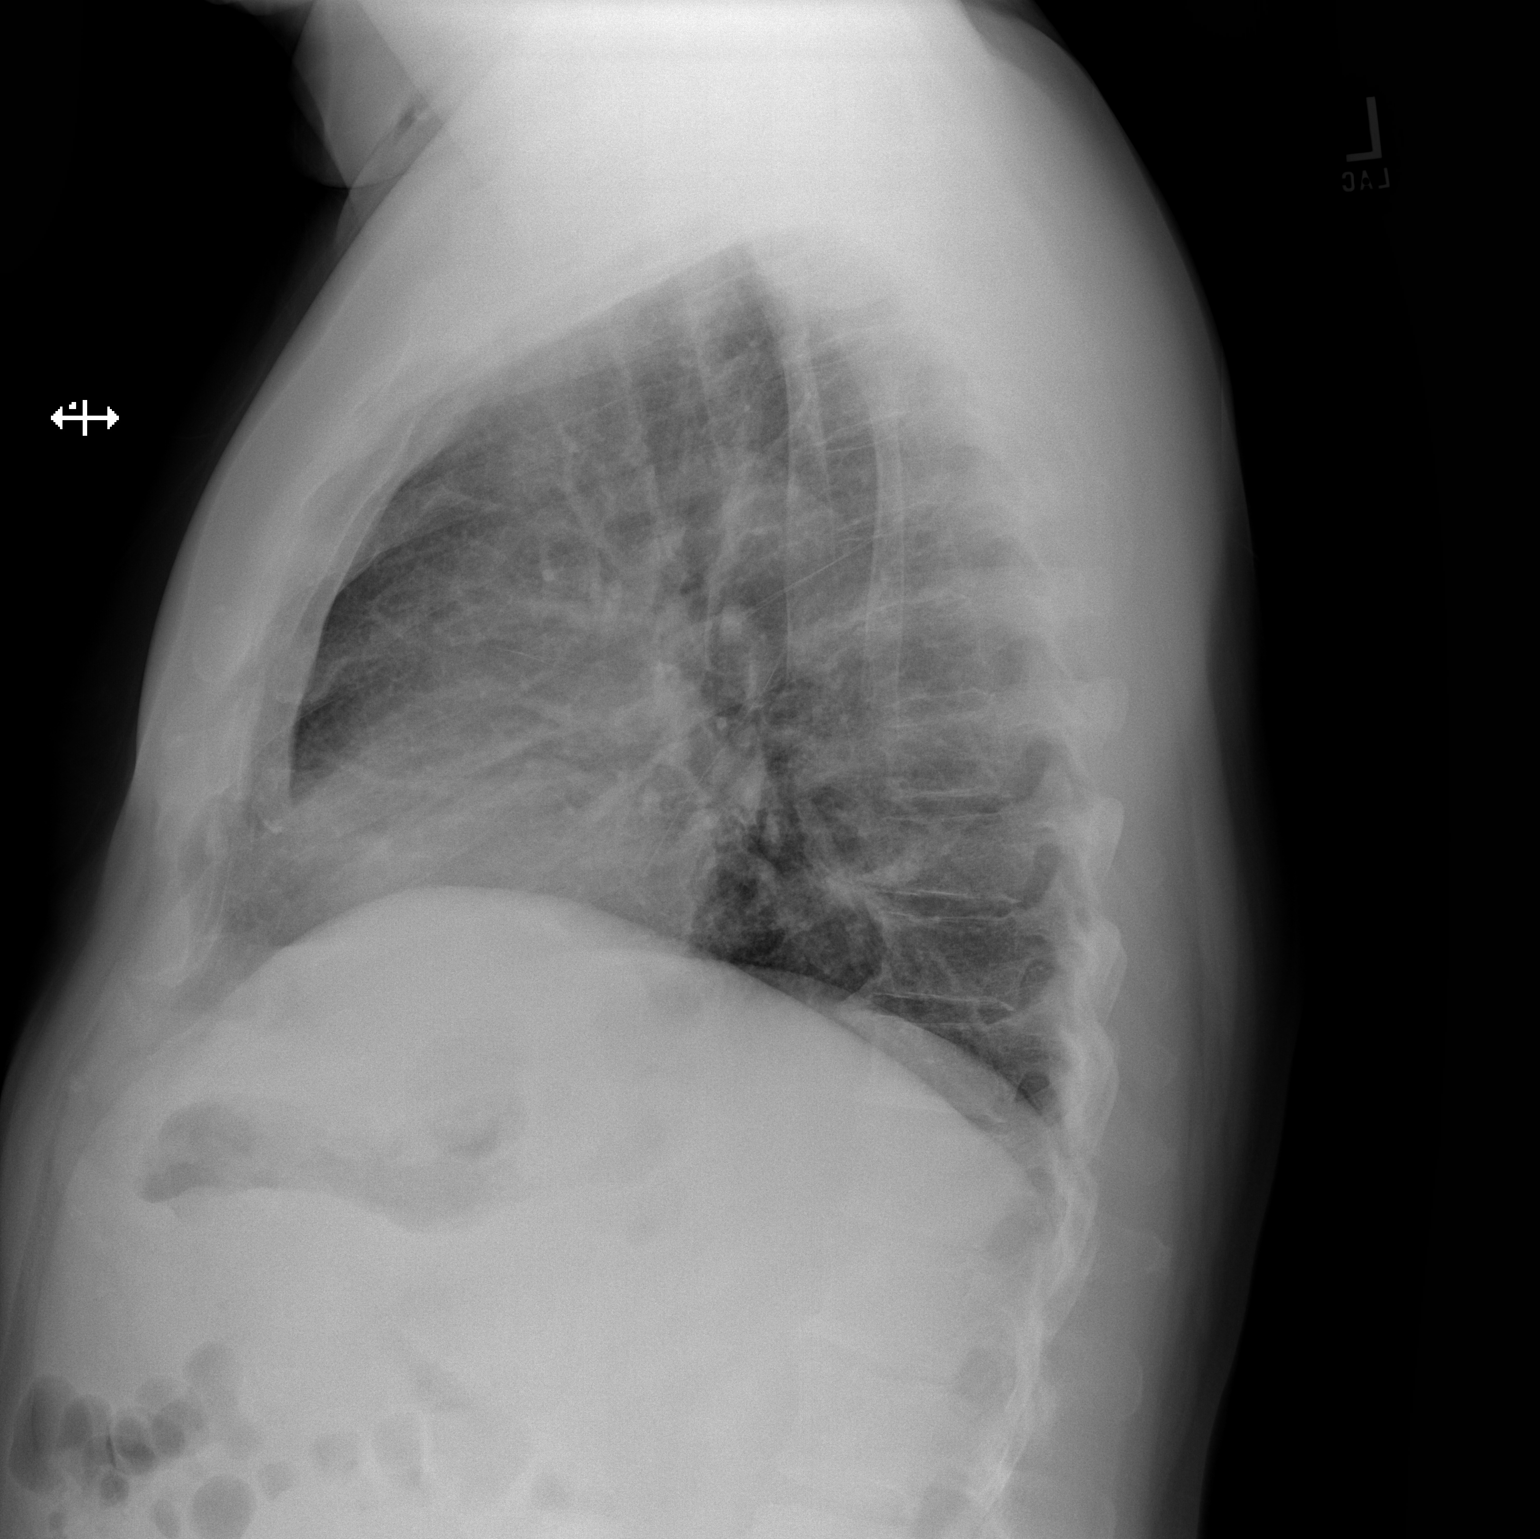

[2 of 2 positions shown; findings below may reference images not displayed]

PROCEDURE:     DXR - DXR CHEST PA (OR AP) AND LATERAL  - May 12, 2013  [DATE]

RESULT:     Comparison is made to a study dated March 14, 2013.

The lungs are borderline hypoinflated. The interstitial markings are coarse
but this is not new. The cardiac silhouette is normal in size. The central
pulmonary vascularity is prominent. There is no pleural effusion or alveolar
infiltrate. The bony thorax exhibits no acute abnormality.
IMPRESSION: The interstitial markings of both lungs are mildly
increased and the central pulmonary vascularity is prominent. This may
reflect interstitial edema of cardiac or noncardiac cause. There is no focal
pneumonia. Followup films with deep inspiration would be of value.

[REDACTED]

## 2014-02-28 ENCOUNTER — Emergency Department: Payer: Self-pay | Admitting: Emergency Medicine

## 2014-03-30 ENCOUNTER — Emergency Department: Payer: Self-pay | Admitting: Emergency Medicine

## 2014-03-30 LAB — URINALYSIS, COMPLETE
Bacteria: NONE SEEN
Bilirubin,UR: NEGATIVE
Blood: NEGATIVE
Glucose,UR: NEGATIVE mg/dL (ref 0–75)
Ketone: NEGATIVE
Nitrite: NEGATIVE
Ph: 5 (ref 4.5–8.0)
Protein: NEGATIVE
RBC,UR: 1 /HPF (ref 0–5)
Specific Gravity: 1.017 (ref 1.003–1.030)
Squamous Epithelial: 1
WBC UR: 18 /HPF (ref 0–5)

## 2014-03-30 LAB — COMPREHENSIVE METABOLIC PANEL
Albumin: 3.6 g/dL (ref 3.4–5.0)
Alkaline Phosphatase: 79 U/L
Anion Gap: 7 (ref 7–16)
BUN: 12 mg/dL (ref 7–18)
Bilirubin,Total: 0.5 mg/dL (ref 0.2–1.0)
Calcium, Total: 9.1 mg/dL (ref 8.5–10.1)
Chloride: 106 mmol/L (ref 98–107)
Co2: 27 mmol/L (ref 21–32)
Creatinine: 1.03 mg/dL (ref 0.60–1.30)
EGFR (African American): >60
EGFR (Non-African Amer.): >60
Glucose: 117 mg/dL — ABNORMAL HIGH (ref 65–99)
Osmolality: 280 (ref 275–301)
Potassium: 3.8 mmol/L (ref 3.5–5.1)
SGOT(AST): 28 U/L (ref 15–37)
SGPT (ALT): 36 U/L (ref 12–78)
Sodium: 140 mmol/L (ref 136–145)
Total Protein: 6.9 g/dL (ref 6.4–8.2)

## 2014-03-30 LAB — CBC
HCT: 43.2 % (ref 40.0–52.0)
HGB: 14.3 g/dL (ref 13.0–18.0)
MCH: 29.1 pg (ref 26.0–34.0)
MCHC: 33.1 g/dL (ref 32.0–36.0)
MCV: 88 fL (ref 80–100)
Platelet: 181 10*3/uL (ref 150–440)
RBC: 4.92 10*6/uL (ref 4.40–5.90)
RDW: 16.1 % — ABNORMAL HIGH (ref 11.5–14.5)
WBC: 5.1 10*3/uL (ref 3.8–10.6)

## 2014-03-30 LAB — TROPONIN I: Troponin-I: <0.02 ng/mL

## 2014-03-30 LAB — LIPASE, BLOOD: Lipase: 148 U/L (ref 73–393)

## 2014-04-04 IMAGING — CR DG CHEST 2V
1 series · 2 of 2 positions shown · non-contrast
Comparison: none

REASON FOR EXAM: Chest Pain
COMMENTS:

PROCEDURE:     DXR - DXR CHEST PA (OR AP) AND LATERAL  - July 04, 2013  [DATE]
RESULT:     The lungs are clear. The cardiac silhouette and visualized bony
skeleton are unremarkable. The patient has taken a shallow inspiration.

[Series 1: w chest pa · 0.14mm/px · 2 of 2 slices shown]
[im 1/2]
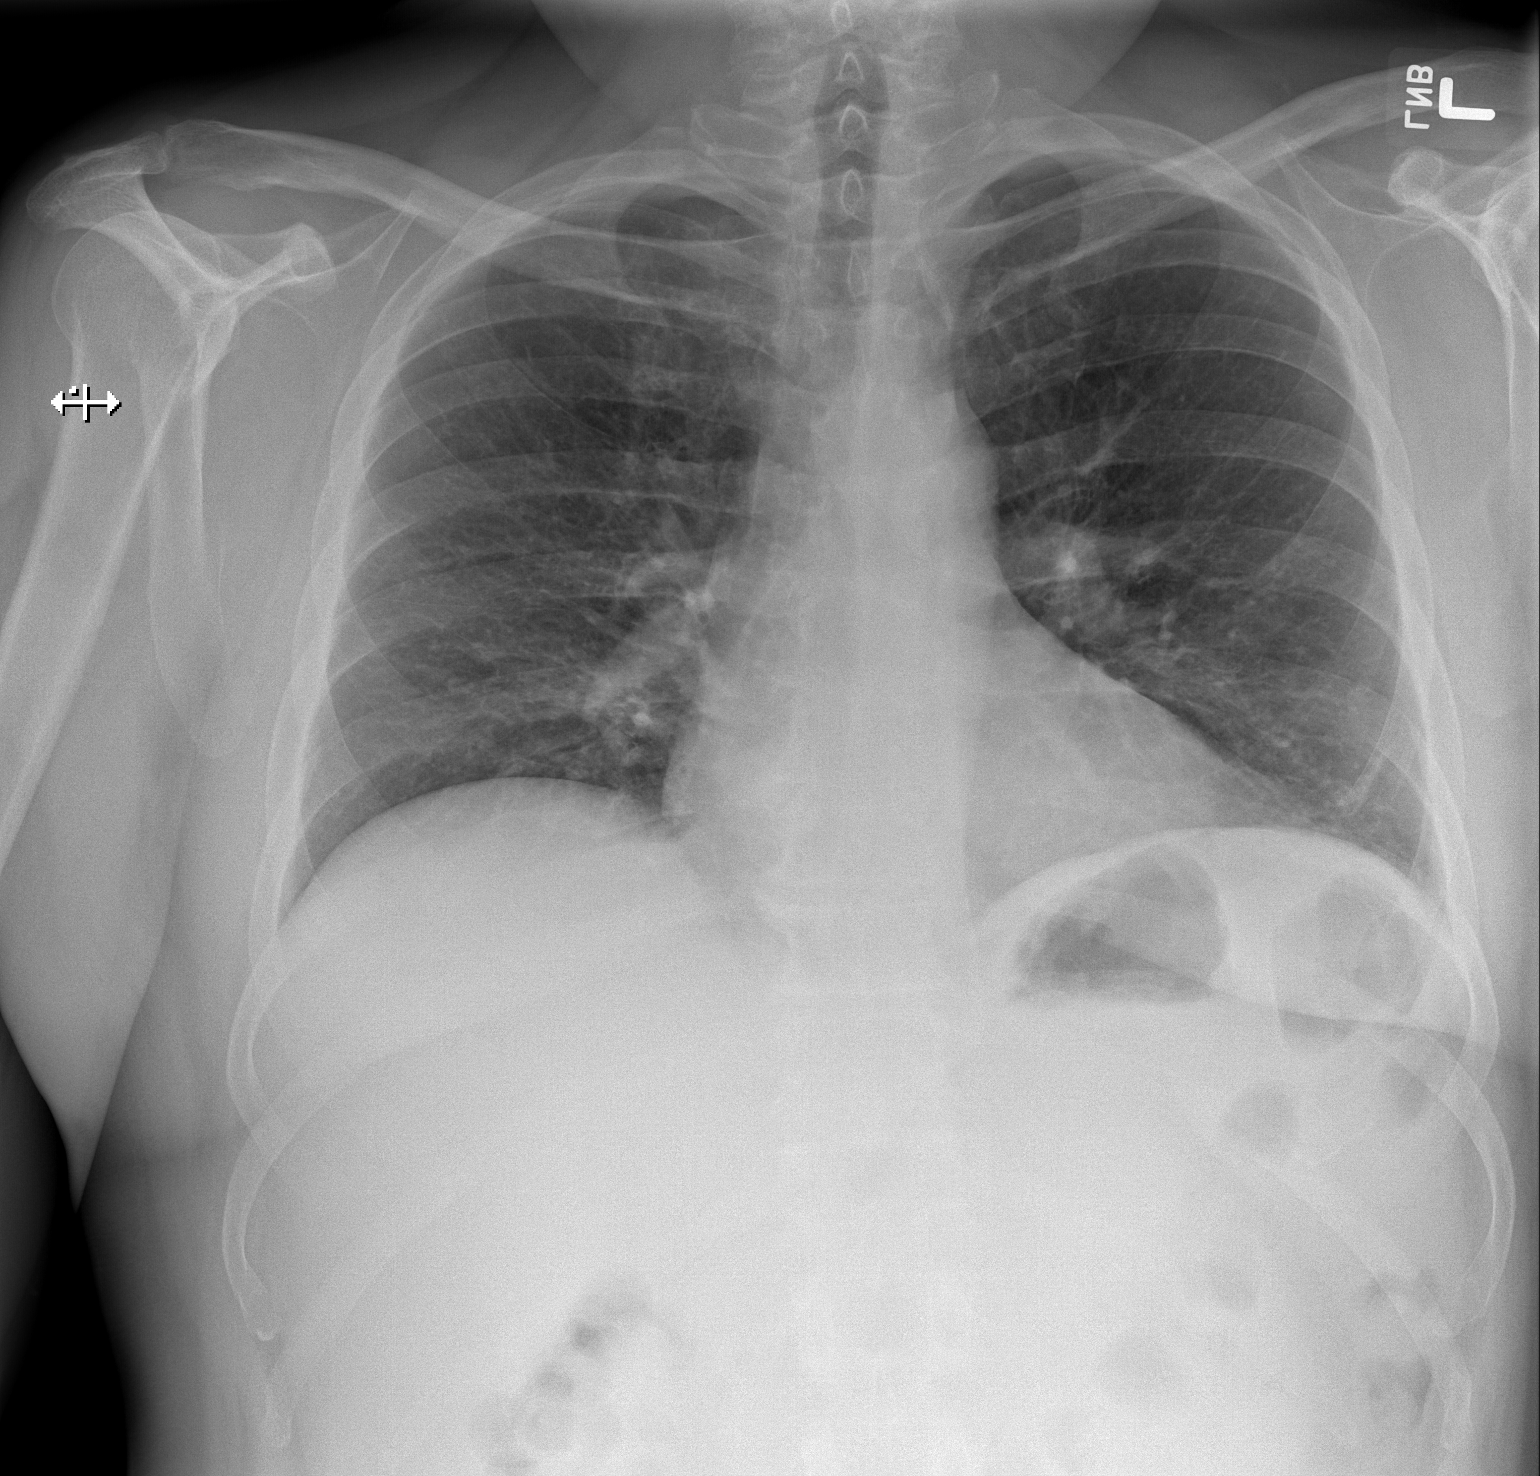
[im 2/2]
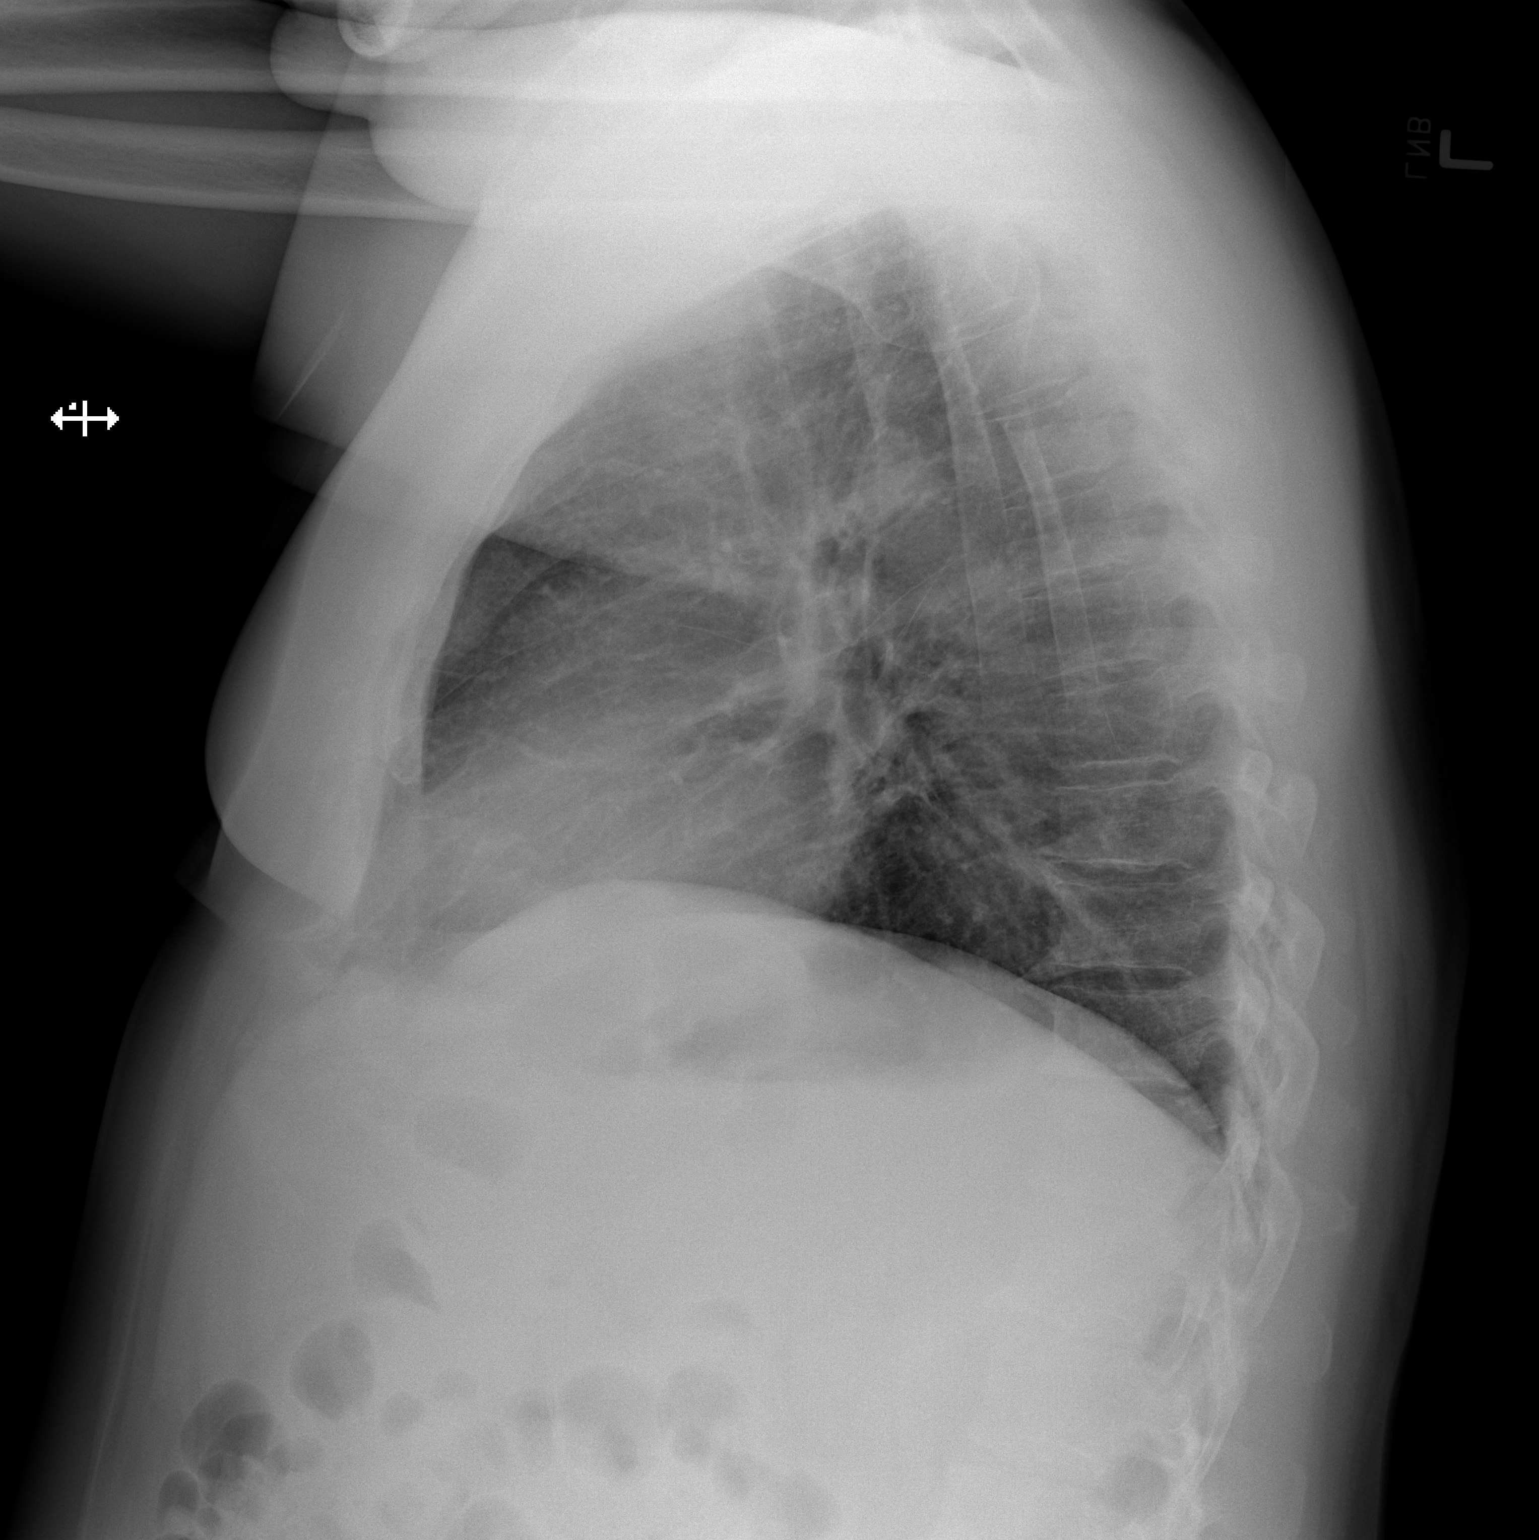

[2 of 2 positions shown; findings below may reference images not displayed]

IMPRESSION: 1. Chest radiograph without evidence of acute cardiopulmonary disease.
2. Comparison made to prior study dated 05/12/2013.

## 2014-04-09 ENCOUNTER — Emergency Department: Payer: Self-pay | Admitting: Emergency Medicine

## 2014-05-19 IMAGING — US ABDOMEN ULTRASOUND LIMITED
1 series · 14 of 25 positions shown · non-contrast
Comparison: none

REASON FOR EXAM: abd pain
COMMENTS:   Body Site: GB and Fossa, CBD, Head of Pancreas; Right Upper Quad

[Series 1: abdomen ultrasound limited · 0.31mm/px · 14 of 49 slices shown]
[im 1/49]
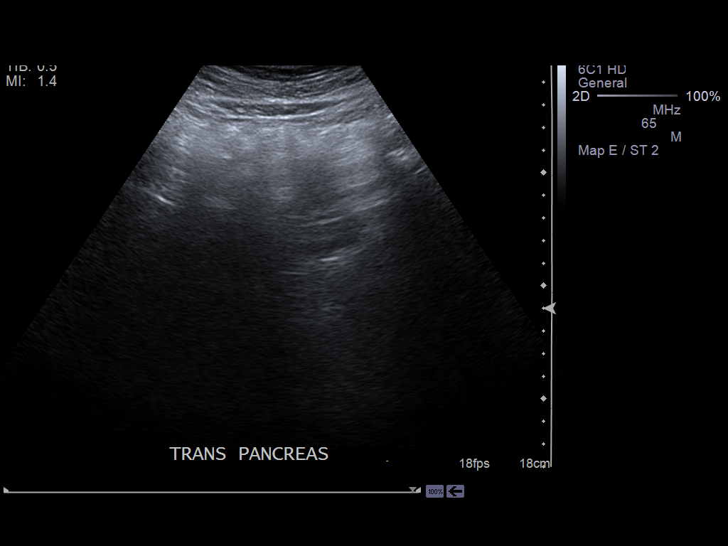
[im 5/49]
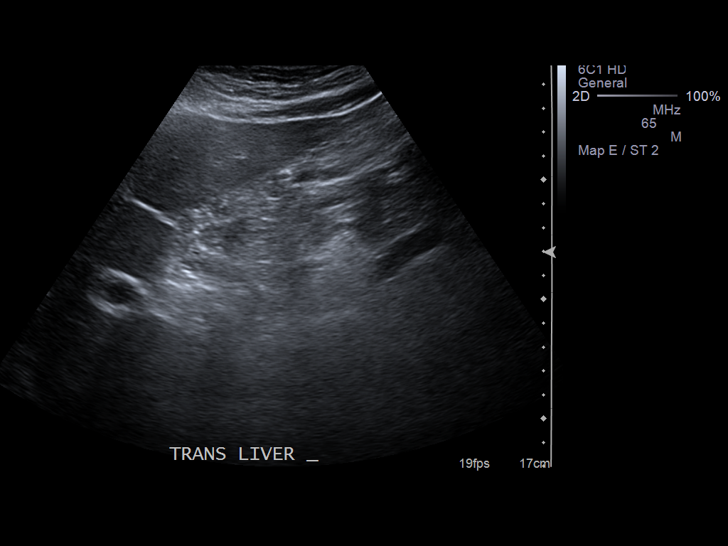
[im 9/49]
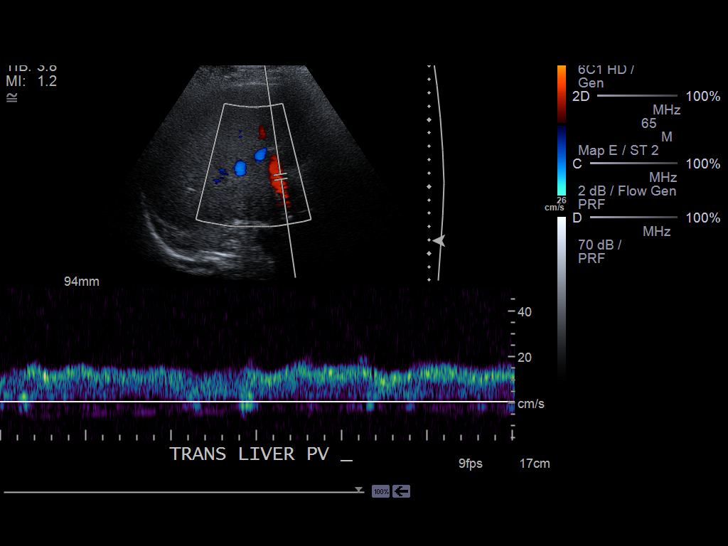
[im 13/49]
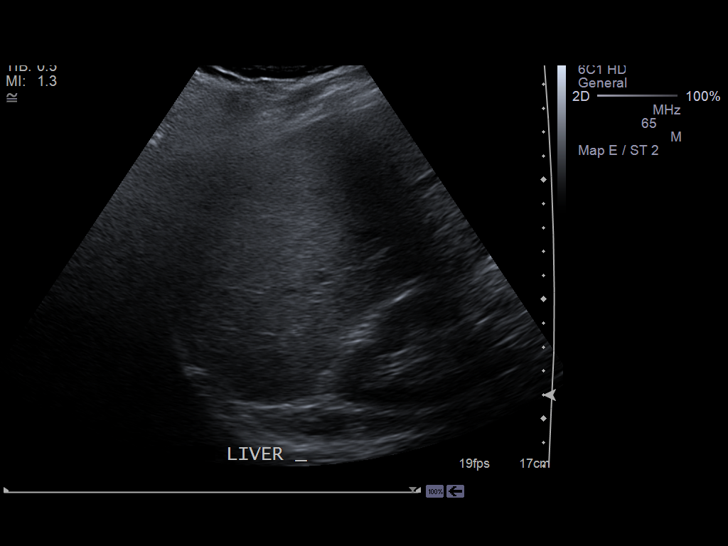
[im 17/49]
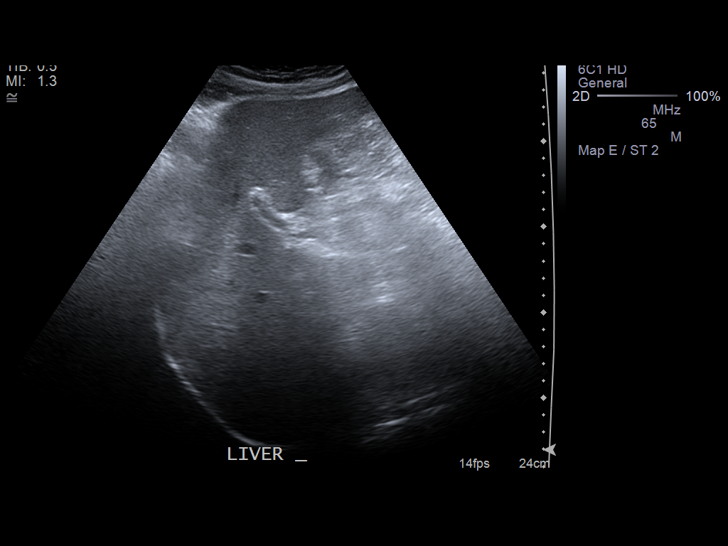
[im 19/49]
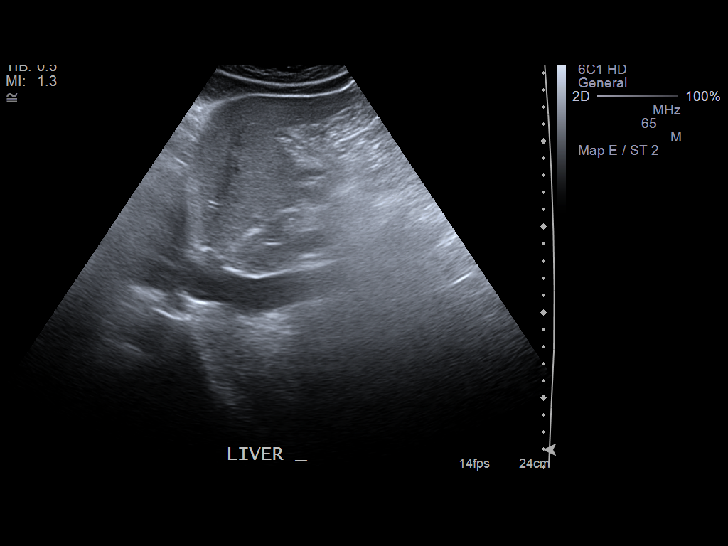
[im 23/49]
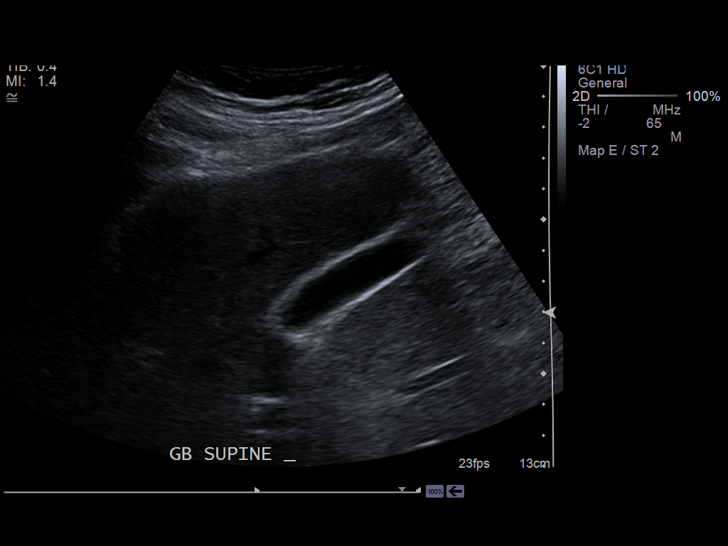
[im 27/49]
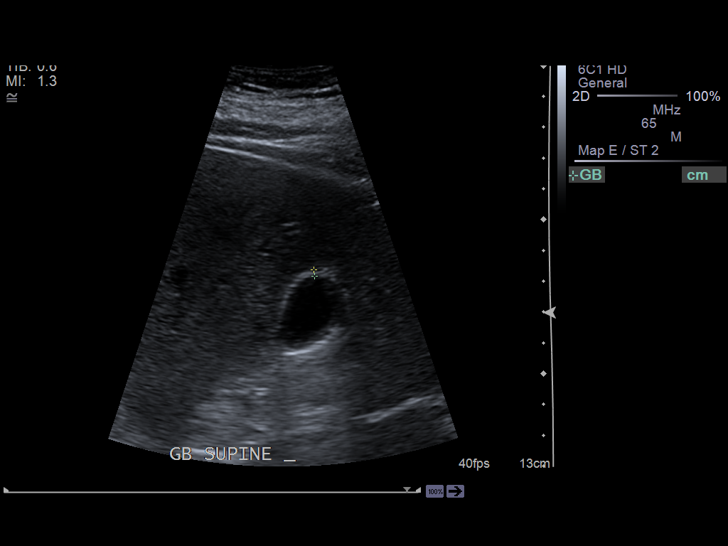
[im 31/49]
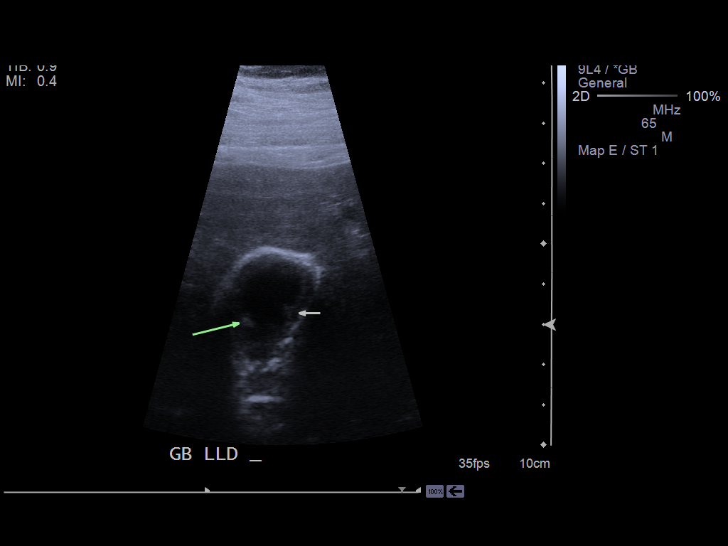
[im 33/49]
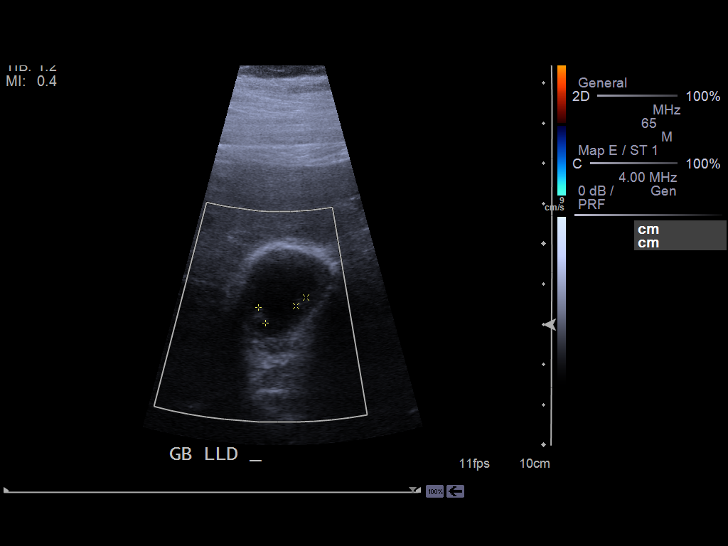
[im 37/49]
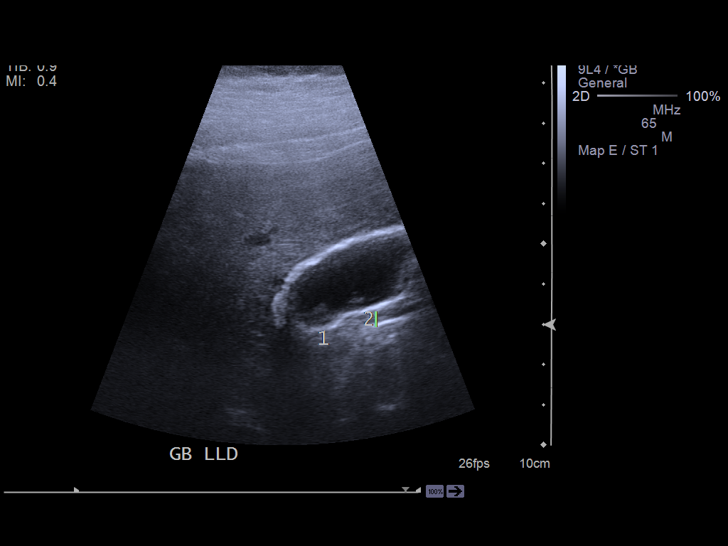
[im 41/49]
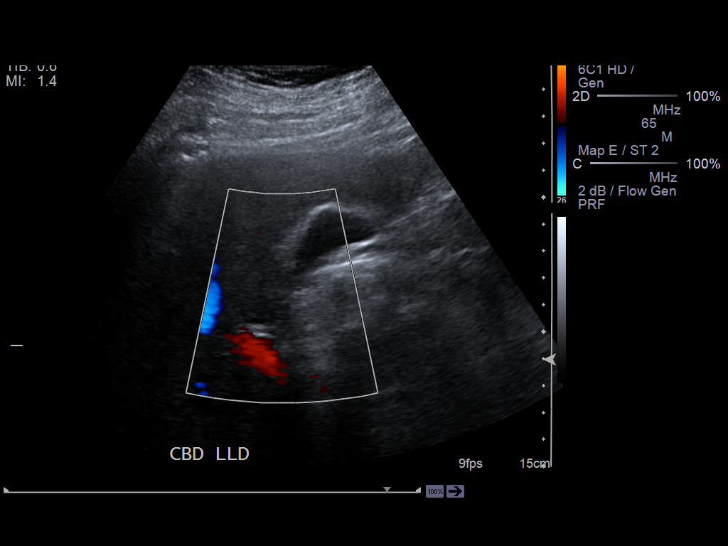
[im 45/49]
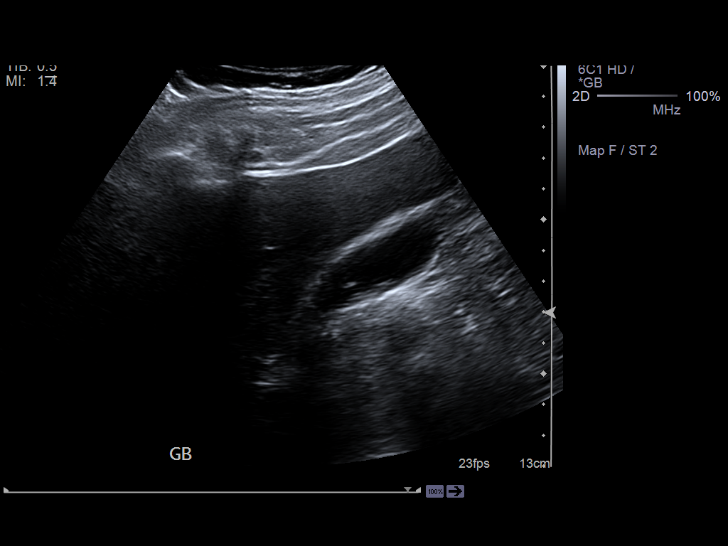
[im 49/49]
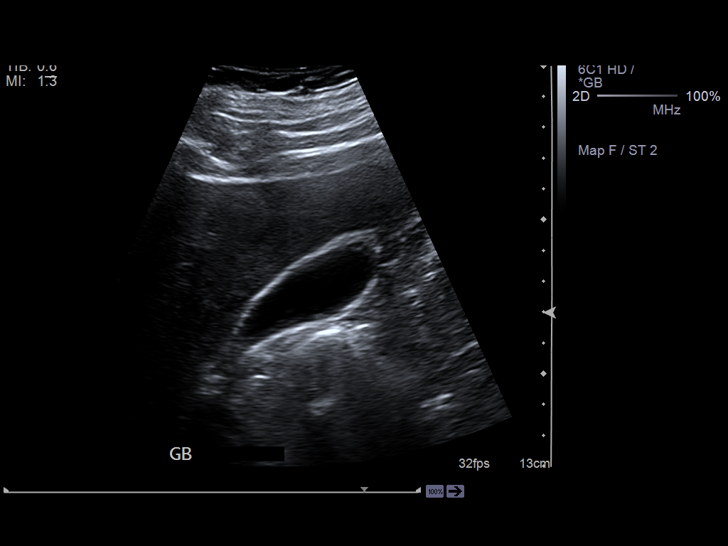

[14 of 25 positions shown; findings below may reference images not displayed]

PROCEDURE:     US  - US ABDOMEN LIMITED SURVEY  - August 18, 2013  [DATE]

RESULT:     Limited right upper quadrant abdominal sonogram demonstrates
sludge present in the gallbladder. There is mildly increased hepatic
echotexture with an overall liver length of 17.57 cm. There is no
intrahepatic or extrahepatic biliary ductal dilation. The gallbladder does
not appear to be completely distended. The pancreas is not visualized
because of overlying bowel gas. Gallbladder wall thickness measures 2.1 mm.
There are nonmobile echogenic nonshadowing structures along the gallbladder
wall which could represent small polyps.
IMPRESSION: 1. Several 4 mm to 5 mm gallbladder polyps. Gallbladder sludge present. No
other significant abnormality. The pancreas is not seen.

[REDACTED]

## 2014-05-29 ENCOUNTER — Emergency Department: Payer: Self-pay | Admitting: Internal Medicine

## 2014-06-04 ENCOUNTER — Emergency Department: Payer: Self-pay | Admitting: Emergency Medicine

## 2014-06-07 LAB — BASIC METABOLIC PANEL
Anion Gap: 14 (ref 7–16)
BUN: 12 mg/dL (ref 7–18)
Calcium, Total: 8.7 mg/dL (ref 8.5–10.1)
Chloride: 108 mmol/L — ABNORMAL HIGH (ref 98–107)
Co2: 20 mmol/L — ABNORMAL LOW (ref 21–32)
Creatinine: 0.98 mg/dL (ref 0.60–1.30)
EGFR (African American): >60
EGFR (Non-African Amer.): >60
Glucose: 222 mg/dL — ABNORMAL HIGH (ref 65–99)
Osmolality: 290 (ref 275–301)
Potassium: 3.6 mmol/L (ref 3.5–5.1)
Sodium: 142 mmol/L (ref 136–145)

## 2014-06-07 LAB — CBC
HCT: 41.3 % (ref 40.0–52.0)
HGB: 13.3 g/dL (ref 13.0–18.0)
MCH: 29.3 pg (ref 26.0–34.0)
MCHC: 32.2 g/dL (ref 32.0–36.0)
MCV: 91 fL (ref 80–100)
Platelet: 260 10*3/uL (ref 150–440)
RBC: 4.53 10*6/uL (ref 4.40–5.90)
RDW: 15 % — ABNORMAL HIGH (ref 11.5–14.5)
WBC: 7.4 10*3/uL (ref 3.8–10.6)

## 2014-06-07 LAB — TROPONIN I: Troponin-I: <0.02 ng/mL

## 2014-06-08 ENCOUNTER — Inpatient Hospital Stay: Payer: Self-pay | Admitting: Internal Medicine

## 2014-06-08 LAB — HEMOGLOBIN A1C: Hemoglobin A1C: 5.8 % (ref 4.2–6.3)

## 2014-06-09 LAB — BASIC METABOLIC PANEL
Anion Gap: 9 (ref 7–16)
BUN: 15 mg/dL (ref 7–18)
Calcium, Total: 8.9 mg/dL (ref 8.5–10.1)
Chloride: 103 mmol/L (ref 98–107)
Co2: 27 mmol/L (ref 21–32)
Creatinine: 0.9 mg/dL (ref 0.60–1.30)
EGFR (African American): >60
EGFR (Non-African Amer.): >60
Glucose: 156 mg/dL — ABNORMAL HIGH (ref 65–99)
Osmolality: 282 (ref 275–301)
Potassium: 4.2 mmol/L (ref 3.5–5.1)
Sodium: 139 mmol/L (ref 136–145)

## 2014-06-09 LAB — CBC WITH DIFFERENTIAL/PLATELET
Basophil #: 0 10*3/uL (ref 0.0–0.1)
Basophil %: 0.1 %
Eosinophil #: 0 10*3/uL (ref 0.0–0.7)
Eosinophil %: 0 %
HCT: 40.2 % (ref 40.0–52.0)
HGB: 12.9 g/dL — ABNORMAL LOW (ref 13.0–18.0)
Lymphocyte #: 1 10*3/uL (ref 1.0–3.6)
Lymphocyte %: 7.5 %
MCH: 29.1 pg (ref 26.0–34.0)
MCHC: 32 g/dL (ref 32.0–36.0)
MCV: 91 fL (ref 80–100)
Monocyte #: 1 x10 3/mm (ref 0.2–1.0)
Monocyte %: 7.6 %
Neutrophil #: 10.8 10*3/uL — ABNORMAL HIGH (ref 1.4–6.5)
Neutrophil %: 84.8 %
Platelet: 265 10*3/uL (ref 150–440)
RBC: 4.42 10*6/uL (ref 4.40–5.90)
RDW: 14.9 % — ABNORMAL HIGH (ref 11.5–14.5)
WBC: 12.8 10*3/uL — ABNORMAL HIGH (ref 3.8–10.6)

## 2014-06-09 LAB — MAGNESIUM: Magnesium: 1.7 mg/dL — ABNORMAL LOW

## 2014-06-26 ENCOUNTER — Inpatient Hospital Stay: Payer: Self-pay | Admitting: Internal Medicine

## 2014-06-26 LAB — BASIC METABOLIC PANEL
Anion Gap: 11 (ref 7–16)
BUN: 21 mg/dL — ABNORMAL HIGH (ref 7–18)
Calcium, Total: 8.5 mg/dL (ref 8.5–10.1)
Chloride: 106 mmol/L (ref 98–107)
Co2: 22 mmol/L (ref 21–32)
Creatinine: 1.38 mg/dL — ABNORMAL HIGH (ref 0.60–1.30)
EGFR (African American): >60
EGFR (Non-African Amer.): 58 — ABNORMAL LOW
Glucose: 124 mg/dL — ABNORMAL HIGH (ref 65–99)
Osmolality: 282 (ref 275–301)
Potassium: 4.1 mmol/L (ref 3.5–5.1)
Sodium: 139 mmol/L (ref 136–145)

## 2014-06-26 LAB — CBC WITH DIFFERENTIAL/PLATELET
Basophil #: 0 10*3/uL (ref 0.0–0.1)
Basophil %: 0.7 %
Eosinophil #: 0.1 10*3/uL (ref 0.0–0.7)
Eosinophil %: 1.4 %
HCT: 44.3 % (ref 40.0–52.0)
HGB: 14.3 g/dL (ref 13.0–18.0)
Lymphocyte #: 1.8 10*3/uL (ref 1.0–3.6)
Lymphocyte %: 26.5 %
MCH: 29.2 pg (ref 26.0–34.0)
MCHC: 32.4 g/dL (ref 32.0–36.0)
MCV: 90 fL (ref 80–100)
Monocyte #: 0.9 x10 3/mm (ref 0.2–1.0)
Monocyte %: 12.8 %
Neutrophil #: 4 10*3/uL (ref 1.4–6.5)
Neutrophil %: 58.6 %
Platelet: 154 10*3/uL (ref 150–440)
RBC: 4.91 10*6/uL (ref 4.40–5.90)
RDW: 14.7 % — ABNORMAL HIGH (ref 11.5–14.5)
WBC: 6.7 10*3/uL (ref 3.8–10.6)

## 2014-06-26 LAB — URINALYSIS, COMPLETE
Blood: NEGATIVE
Glucose,UR: NEGATIVE mg/dL (ref 0–75)
Nitrite: NEGATIVE
Ph: 5 (ref 4.5–8.0)
Protein: 30
RBC,UR: 3 /HPF (ref 0–5)
Specific Gravity: 1.036 (ref 1.003–1.030)
Squamous Epithelial: 2
WBC UR: 19 /HPF (ref 0–5)

## 2014-06-26 LAB — TROPONIN I: Troponin-I: <0.02 ng/mL

## 2014-06-27 DIAGNOSIS — R55 Syncope and collapse: Secondary | ICD-10-CM

## 2014-06-27 LAB — BASIC METABOLIC PANEL
Anion Gap: 10 (ref 7–16)
BUN: 21 mg/dL — ABNORMAL HIGH (ref 7–18)
Calcium, Total: 8.2 mg/dL — ABNORMAL LOW (ref 8.5–10.1)
Chloride: 107 mmol/L (ref 98–107)
Co2: 24 mmol/L (ref 21–32)
Creatinine: 1.28 mg/dL (ref 0.60–1.30)
EGFR (African American): >60
EGFR (Non-African Amer.): >60
Glucose: 113 mg/dL — ABNORMAL HIGH (ref 65–99)
Osmolality: 285 (ref 275–301)
Potassium: 3.7 mmol/L (ref 3.5–5.1)
Sodium: 141 mmol/L (ref 136–145)

## 2014-06-27 LAB — CBC WITH DIFFERENTIAL/PLATELET
Basophil #: 0 10*3/uL (ref 0.0–0.1)
Basophil %: 0.7 %
Eosinophil #: 0.1 10*3/uL (ref 0.0–0.7)
Eosinophil %: 1.8 %
HCT: 39 % — ABNORMAL LOW (ref 40.0–52.0)
HGB: 13.2 g/dL (ref 13.0–18.0)
Lymphocyte #: 1.9 10*3/uL (ref 1.0–3.6)
Lymphocyte %: 31 %
MCH: 30.1 pg (ref 26.0–34.0)
MCHC: 33.9 g/dL (ref 32.0–36.0)
MCV: 89 fL (ref 80–100)
Monocyte #: 0.8 x10 3/mm (ref 0.2–1.0)
Monocyte %: 13 %
Neutrophil #: 3.3 10*3/uL (ref 1.4–6.5)
Neutrophil %: 53.5 %
Platelet: 140 10*3/uL — ABNORMAL LOW (ref 150–440)
RBC: 4.39 10*6/uL — ABNORMAL LOW (ref 4.40–5.90)
RDW: 14.8 % — ABNORMAL HIGH (ref 11.5–14.5)
WBC: 6.1 10*3/uL (ref 3.8–10.6)

## 2014-06-27 LAB — CK TOTAL AND CKMB (NOT AT ARMC)
CK, Total: 30 U/L — ABNORMAL LOW
CK, Total: 34 U/L — ABNORMAL LOW
CK, Total: 30 U/L — ABNORMAL LOW
CK-MB: <0.5 ng/mL — ABNORMAL LOW (ref 0.5–3.6)
CK-MB: 0.6 ng/mL (ref 0.5–3.6)
CK-MB: <0.5 ng/mL — ABNORMAL LOW (ref 0.5–3.6)

## 2014-06-27 LAB — LIPID PANEL
Cholesterol: 160 mg/dL (ref 0–200)
HDL Cholesterol: 43 mg/dL (ref 40–60)
Ldl Cholesterol, Calc: 70 mg/dL (ref 0–100)
Triglycerides: 234 mg/dL — ABNORMAL HIGH (ref 0–200)
VLDL Cholesterol, Calc: 47 mg/dL — ABNORMAL HIGH (ref 5–40)

## 2014-06-27 LAB — TROPONIN I
Troponin-I: <0.02 ng/mL
Troponin-I: <0.02 ng/mL

## 2014-06-27 LAB — TSH: Thyroid Stimulating Horm: 2.4 u[IU]/mL

## 2014-06-28 LAB — URINE CULTURE

## 2014-06-28 IMAGING — CT CT ABD-PELV W/ CM
2 of 5 series · 16 of 46 positions shown, 18 images · IV contrast (isovue)
Comparison: Abdominal ultrasound and CT abdomen and pelvis
03/15/2013.

CLINICAL DATA: History of gallbladder polyps by ultrasound. Right
side abdominal pain and nausea. Abdominal bloating.

EXAM:
CT ABDOMEN AND PELVIS WITH CONTRAST
TECHNIQUE: Multidetector CT imaging of the abdomen and pelvis was performed
using the standard protocol following bolus administration of
intravenous contrast.
CONTRAST:  125 cc Isovue 370.

[Series 2: routine with · axial · 0.80mm/px · z∈[-1065,-655]mm · 13 of 94 slices shown, 15 images]
[im 6/94  soft-tissue]
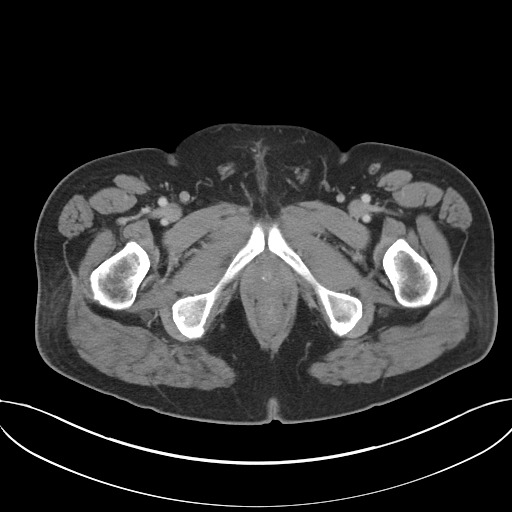
[im 6/94  bone]
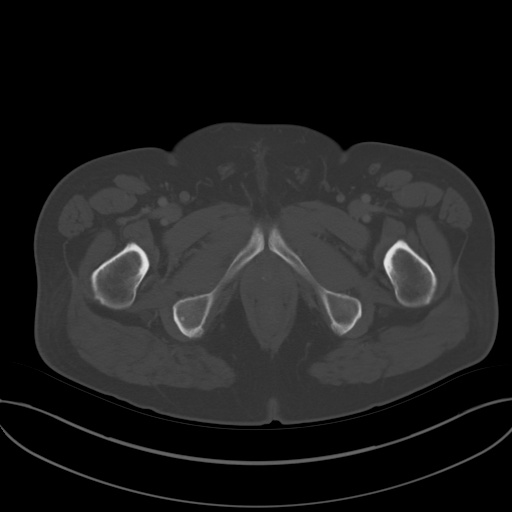
[im 11/94  soft-tissue]
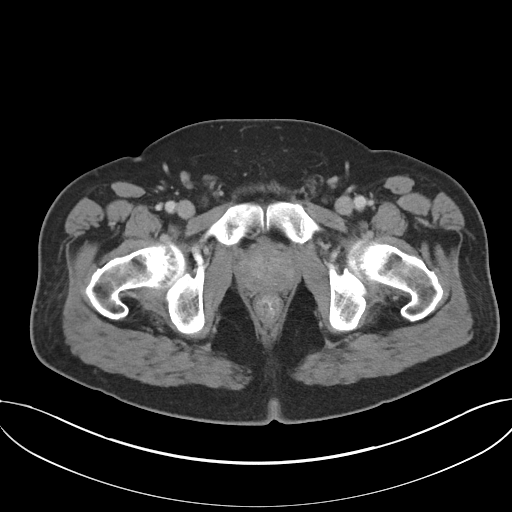
[im 21/94  soft-tissue]
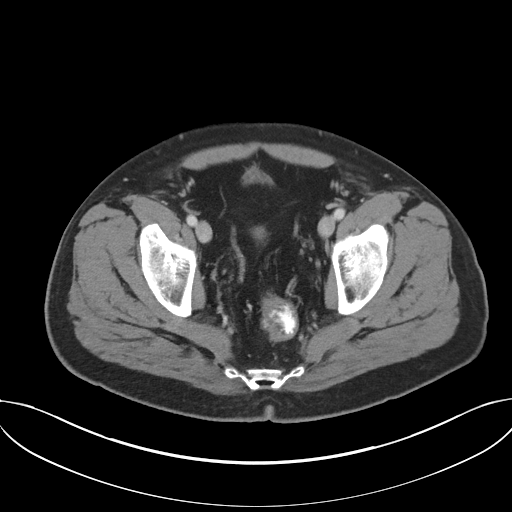
[im 26/94  soft-tissue]
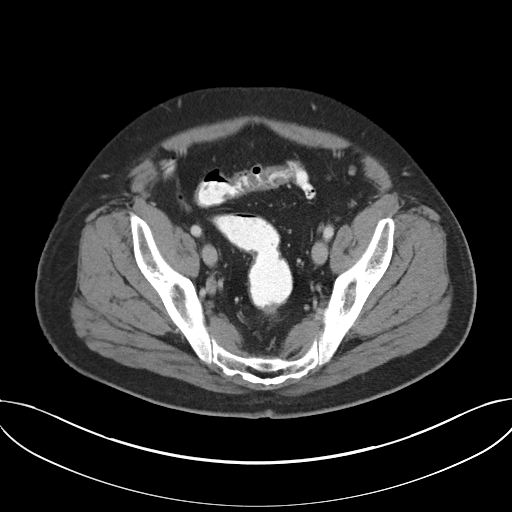
[im 32/94  soft-tissue]
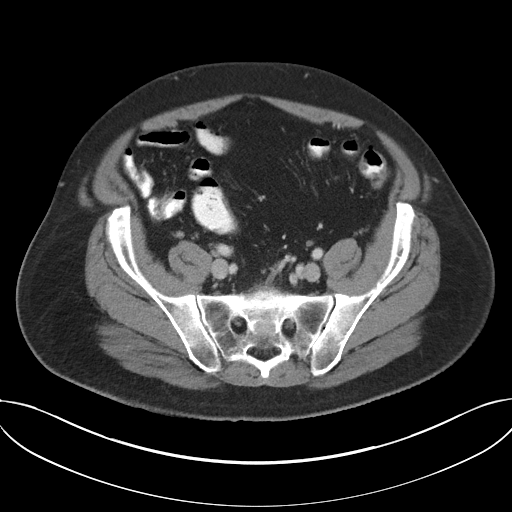
[im 42/94  soft-tissue]
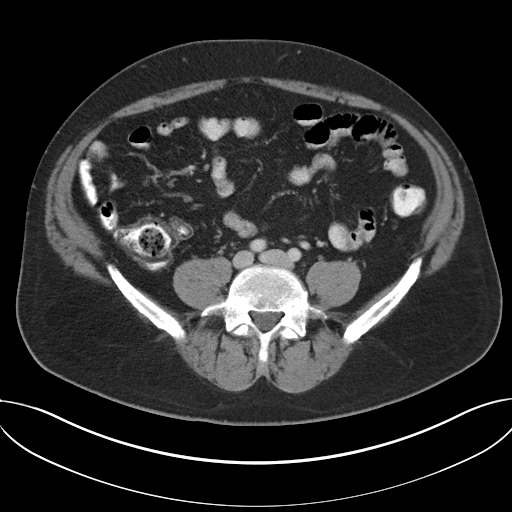
[im 47/94  soft-tissue]
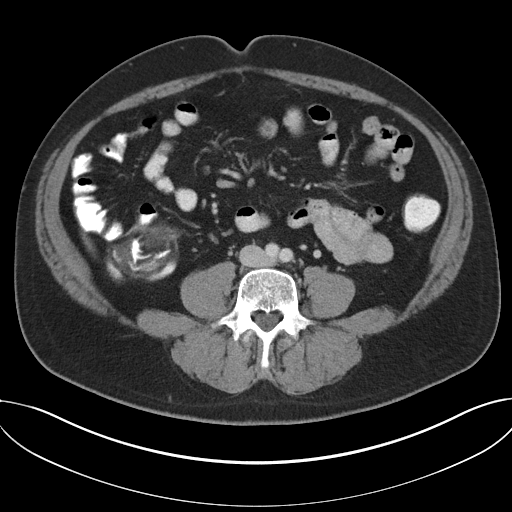
[im 52/94  soft-tissue]
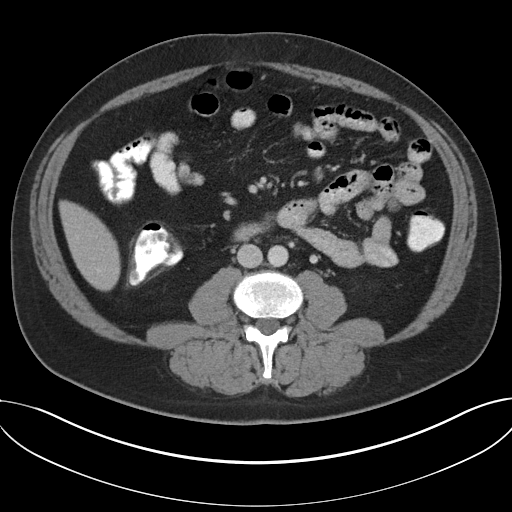
[im 63/94  soft-tissue]
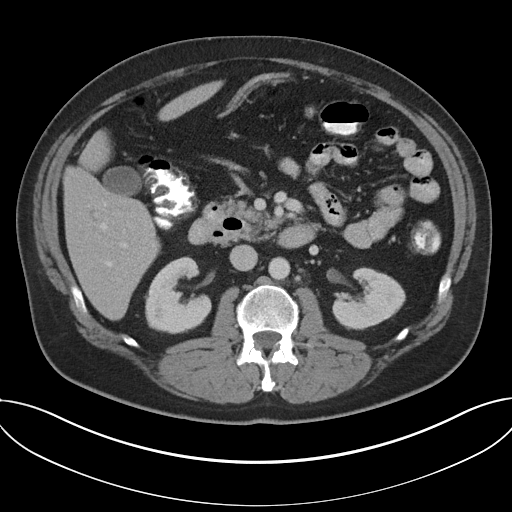
[im 63/94  bone]
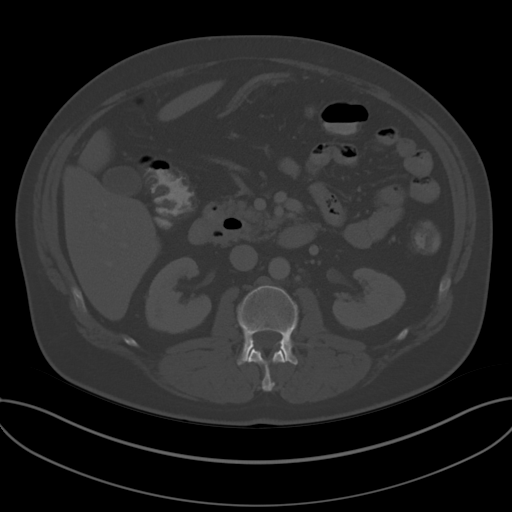
[im 68/94  soft-tissue]
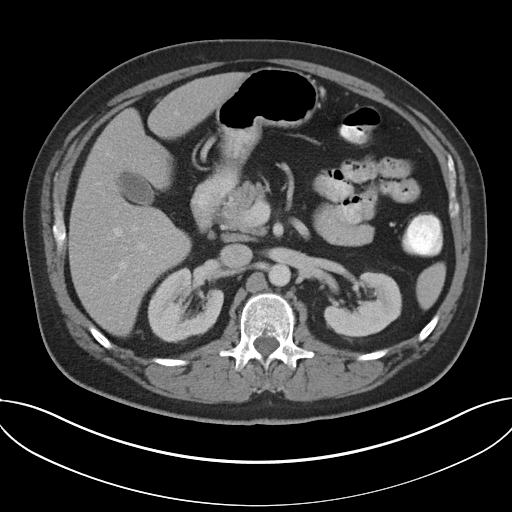
[im 73/94  soft-tissue]
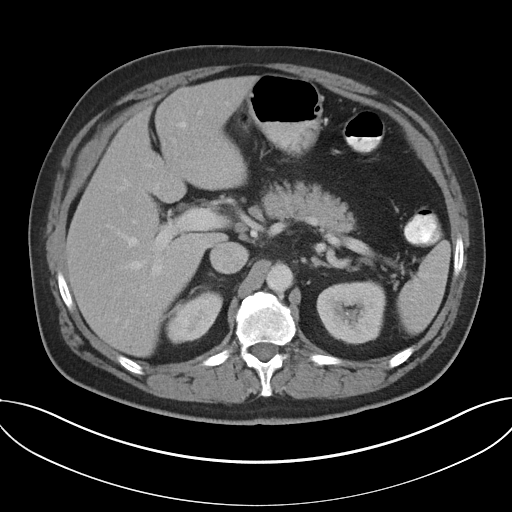
[im 83/94  soft-tissue]
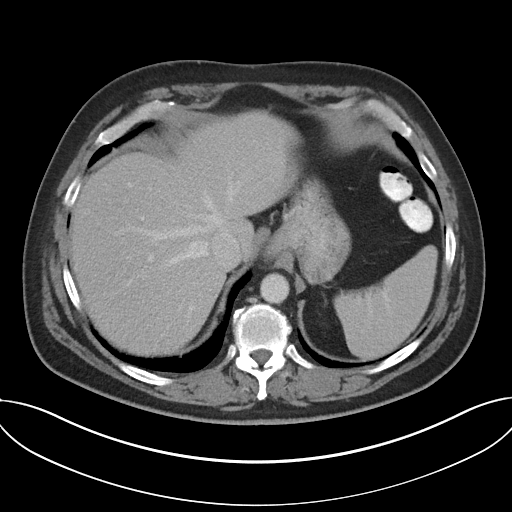
[im 88/94  soft-tissue]
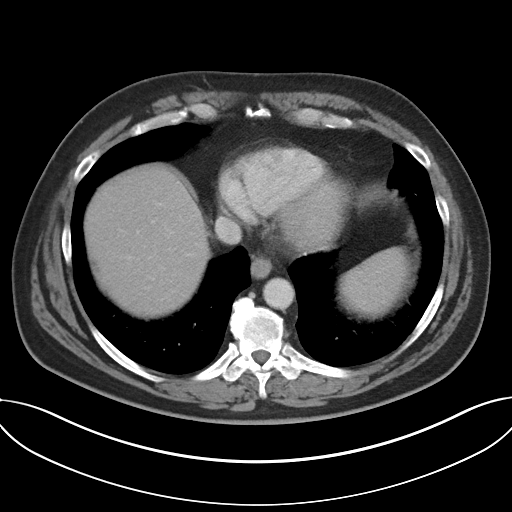

[Series 5: cor routine with · coronal · 0.88mm/px · 3 of 167 slices shown]
[im 56/167  soft-tissue]
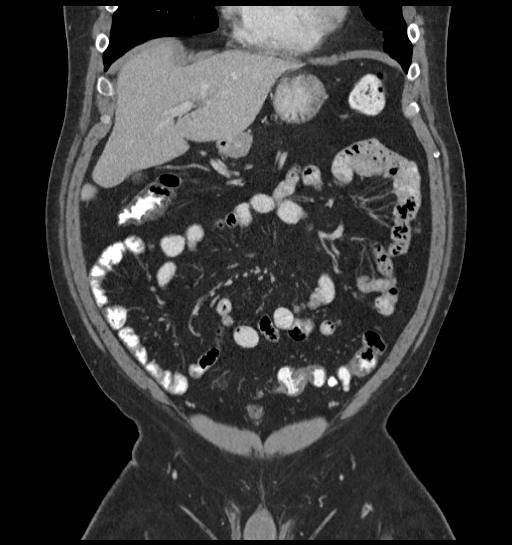
[im 74/167  soft-tissue]
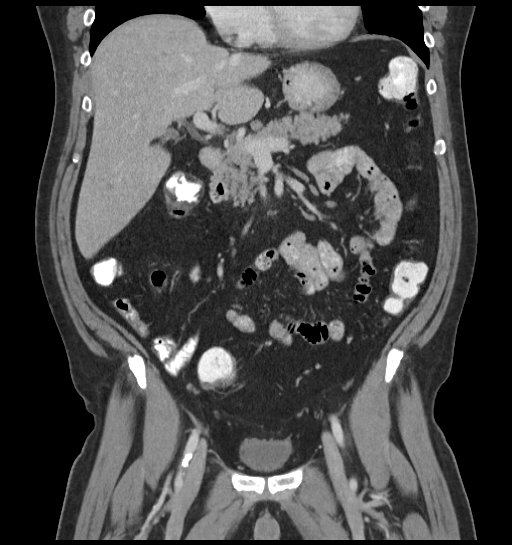
[im 93/167  soft-tissue]
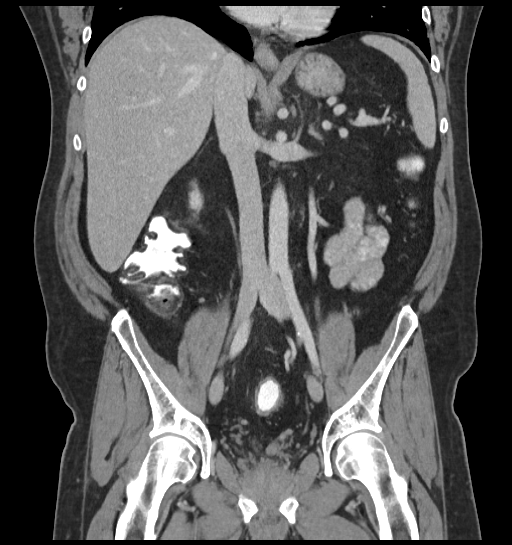

[16 of 46 positions shown; findings below may reference images not displayed]

FINDINGS: Some mild linear scarring is seen in the lingula. The lung bases are
otherwise clear. No pleural or pericardial effusion.

The gallbladder has a normal CT appearance. The biliary tree is
unremarkable. A tiny low attenuating lesion in the inferior right
hepatic lobe on image 33 cannot be definitively characterized but
likely represents a cyst. A second low attenuating lesion in the
right hepatic lobe on image 13 is also too small to characterize but
likely a cyst. The liver is otherwise unremarkable. The spleen,
adrenal glands, pancreas and right kidney appear normal.
Approximately 5 nonobstructing left renal stones are identified. The
largest stone is in the upper pole measuring 0.8 cm. There is no
hydronephrosis on the right or left and no ureteral stones are
present. The urinary bladder is incompletely distended but otherwise
appears normal. The prostate gland is mildly prominent. The small
and large bowel and appendix appear normal. Very small hiatal hernia
is noted. There is no lymphadenopathy or fluid. No focal bony
abnormalities and.
IMPRESSION: No acute finding.

Normal-appearing gallbladder. Please note that ultrasound is more
sensitive for the detection gallbladder polyps.

Nonobstructing left renal stones.

Very small hiatal hernia.

Mild enlargement of the prostate gland.

## 2014-07-31 ENCOUNTER — Emergency Department: Payer: Self-pay | Admitting: Emergency Medicine

## 2014-08-01 IMAGING — CR DG ANKLE COMPLETE 3+V*L*
1 series · 3 of 3 positions shown · non-contrast
Comparison: None.

CLINICAL DATA: 52-year-old male with left lateral ankle pain and
swelling. No known injury, reports recent gout. Initial encounter.

EXAM:
LEFT ANKLE COMPLETE - 3+ VIEW

[Series 1: ap · 0.17mm/px · 3 of 3 slices shown]
[im 1/3]
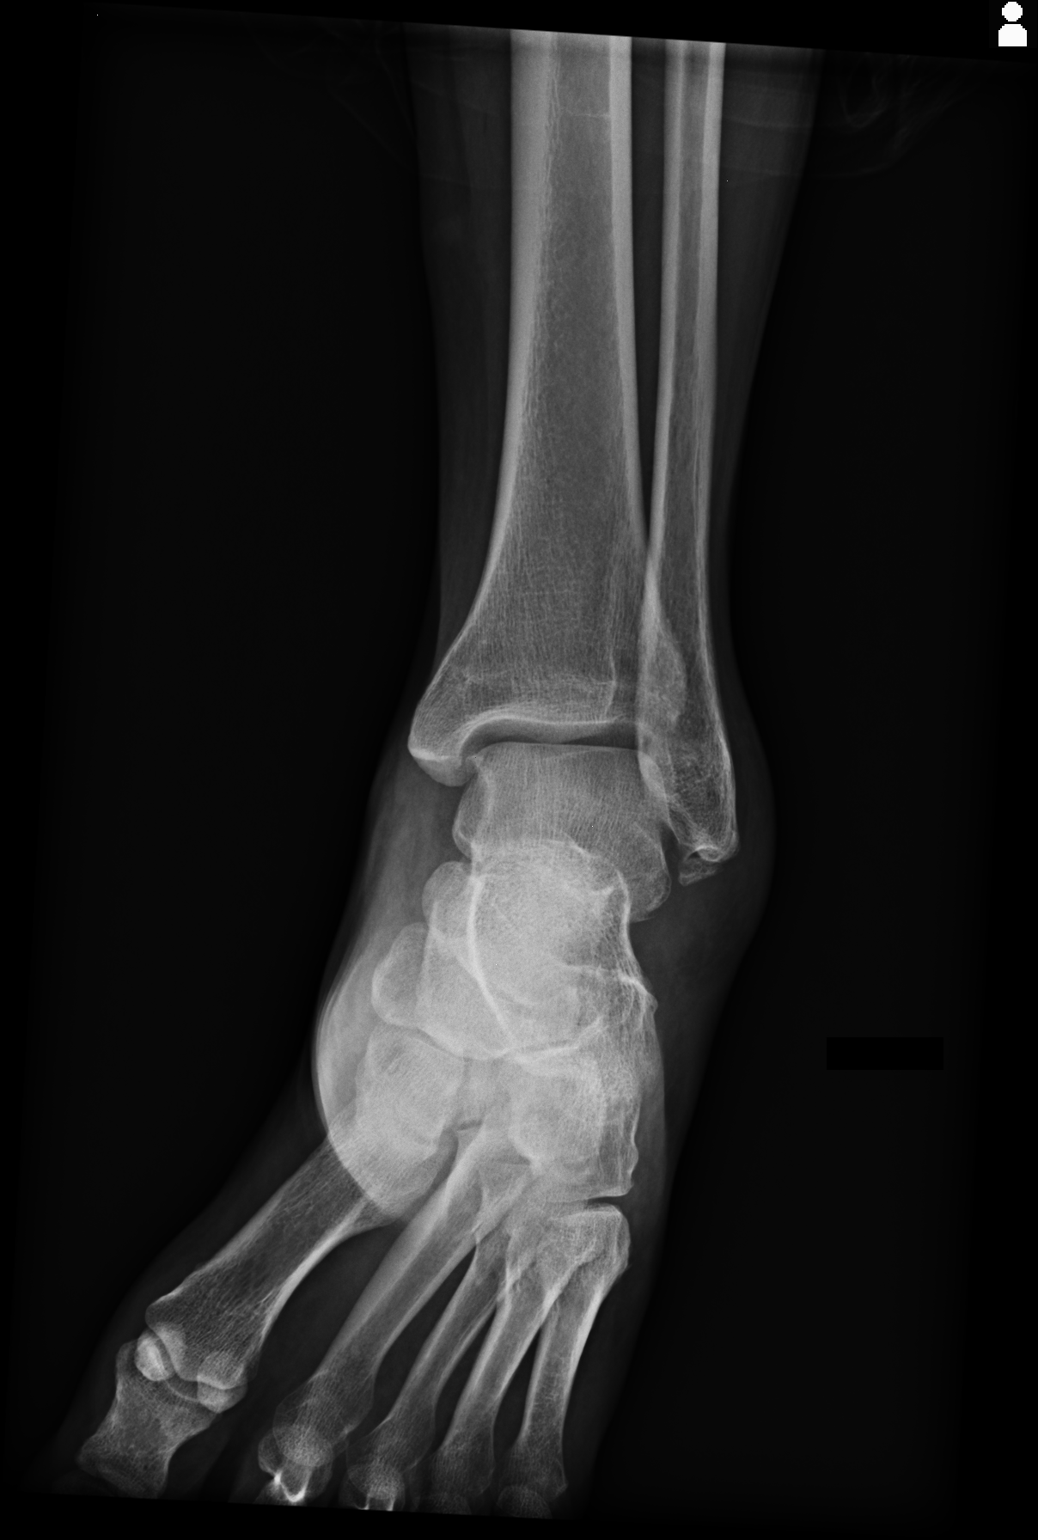
[im 2/3]
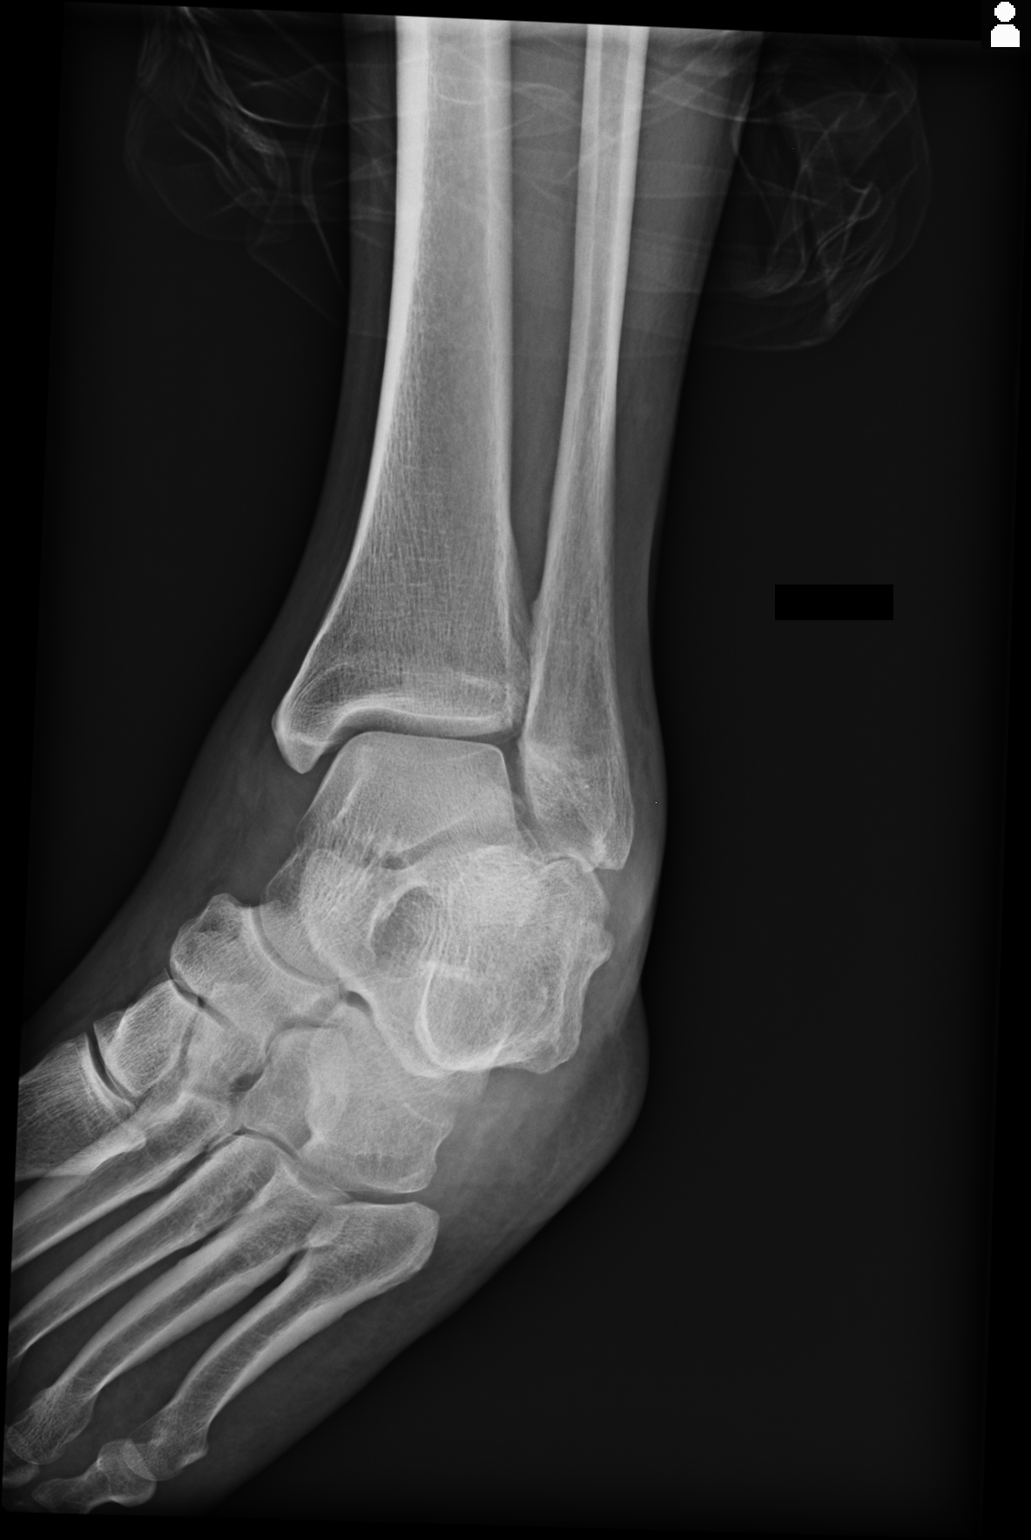
[im 3/3]
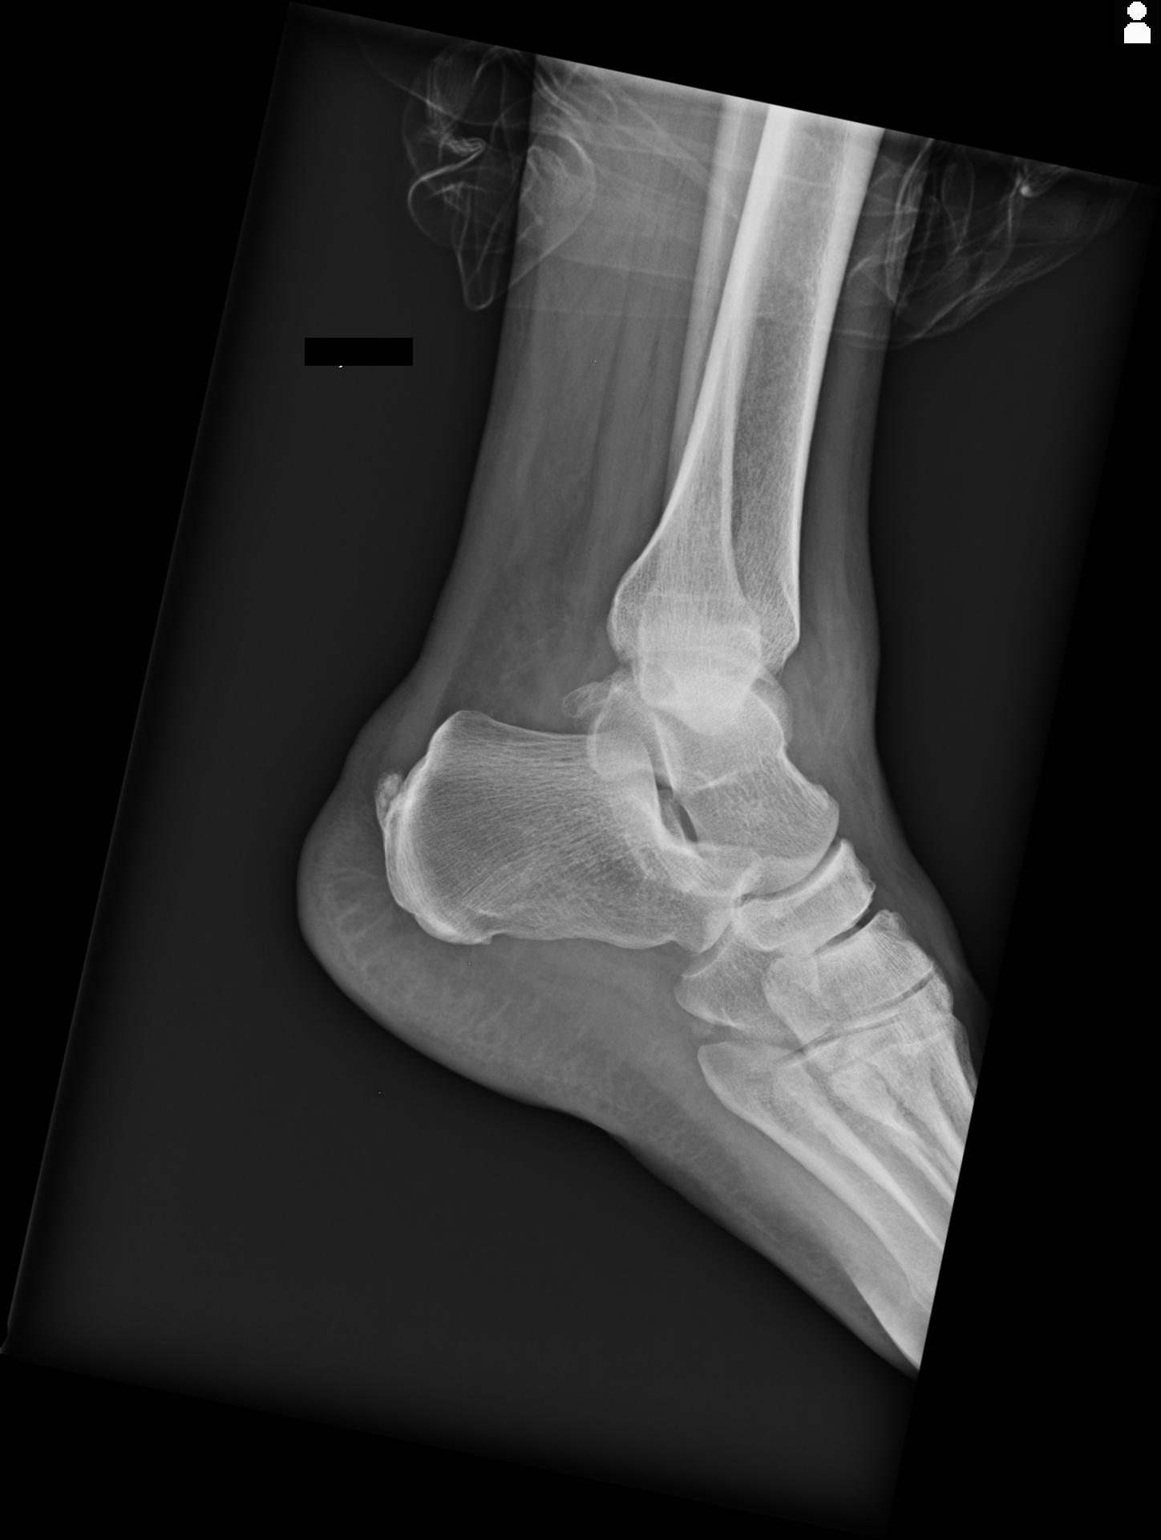

[3 of 3 positions shown; findings below may reference images not displayed]

FINDINGS: There is osseous irregularity at the tip of the low lateral
malleolus, appears to be chronic. Mortise joint alignment maintained
and tailored dome intact. No definite joint effusion. There is
anterior, medial and lateral soft tissue swelling and stranding.
Calcaneus appears intact. Os trigonum. No acute fracture identified.
No joint space erosion identified.
IMPRESSION: Nonspecific soft tissue swelling/stranding about the left ankle.
Query cellulitis.

Chronic appearing fragments at the lateral malleolus. No acute
fracture, dislocation, or joint space erosion identified.

## 2014-08-13 ENCOUNTER — Ambulatory Visit: Payer: Self-pay

## 2014-08-21 IMAGING — CR DG WRIST COMPLETE 3+V*R*
1 series · 4 of 4 positions shown · non-contrast
Comparison: 12/26/2012.

CLINICAL DATA: Joint pain and swelling.  History of gout arthritis.

EXAM:
RIGHT WRIST - COMPLETE 3+ VIEW

[Series 1: pa · 0.17mm/px · 4 of 4 slices shown]
[im 1/4]
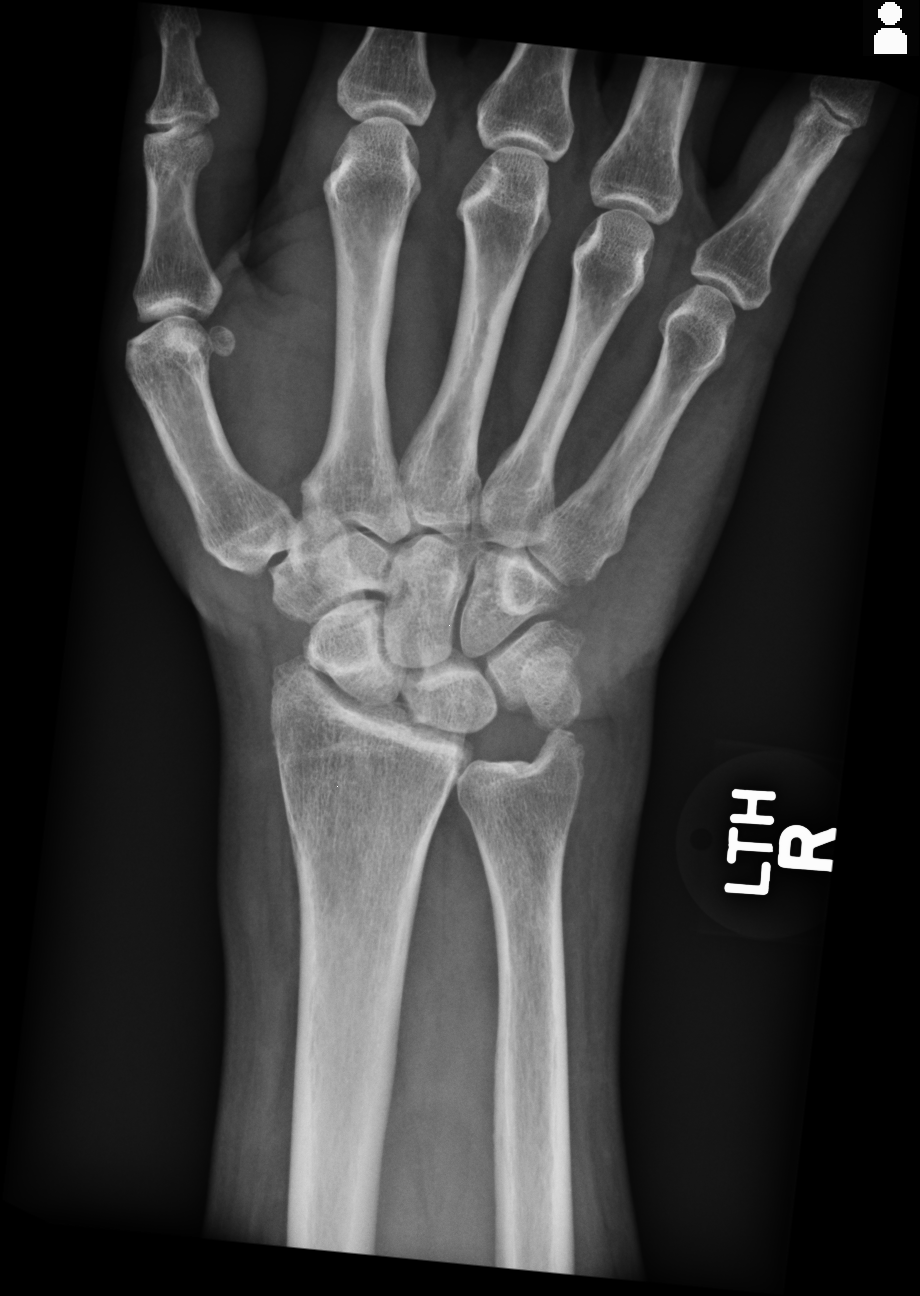
[im 2/4]
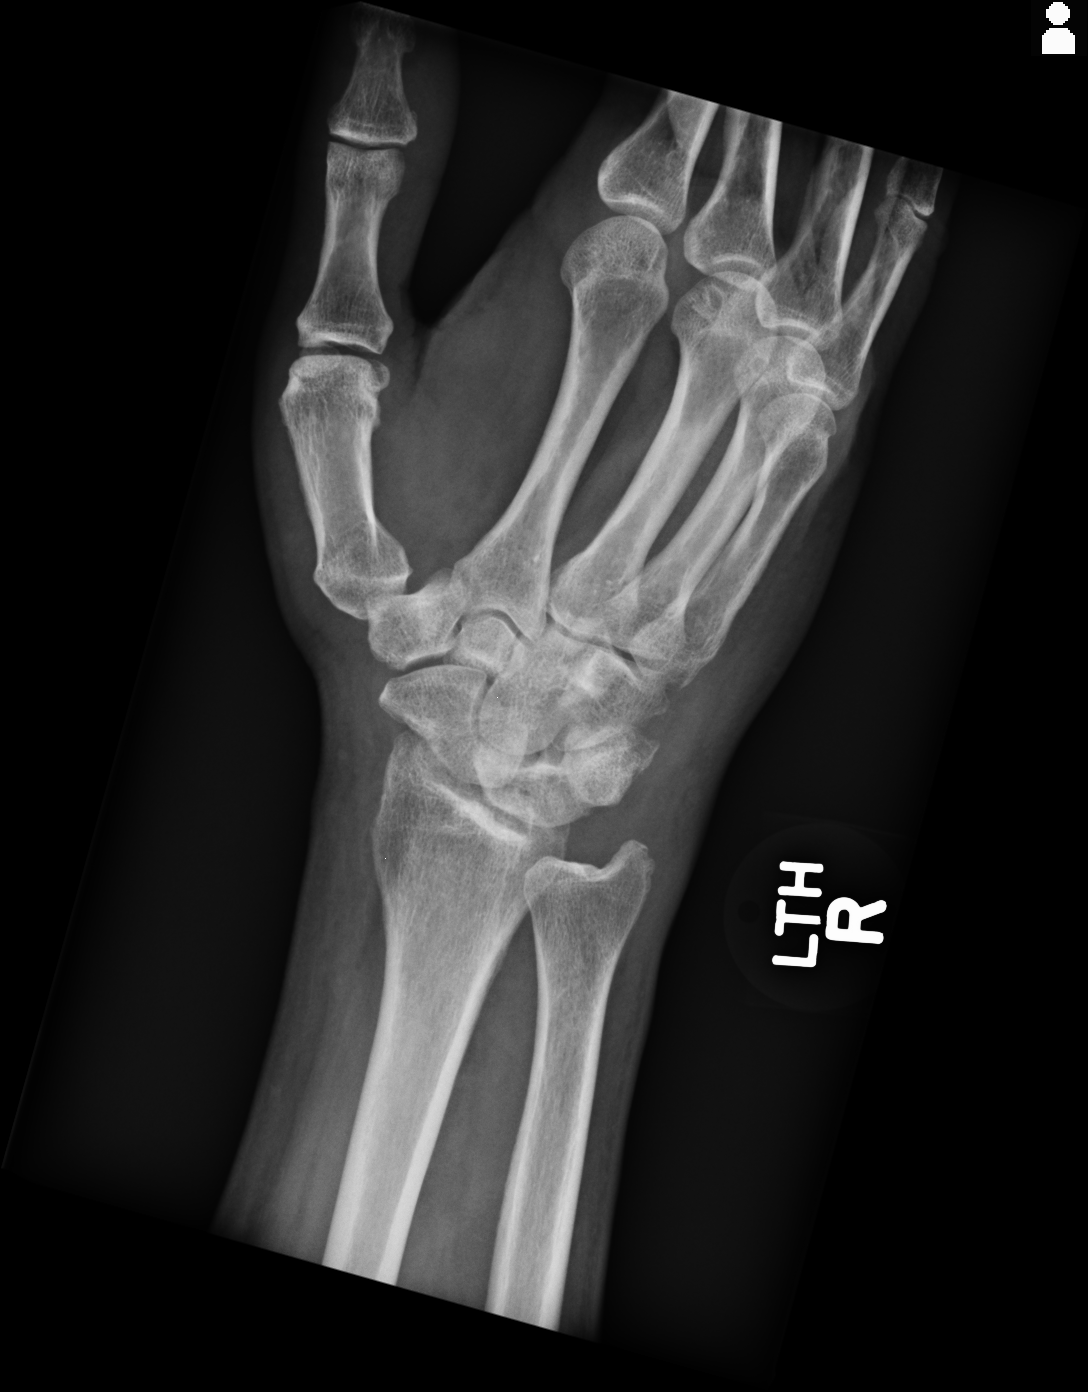
[im 3/4]
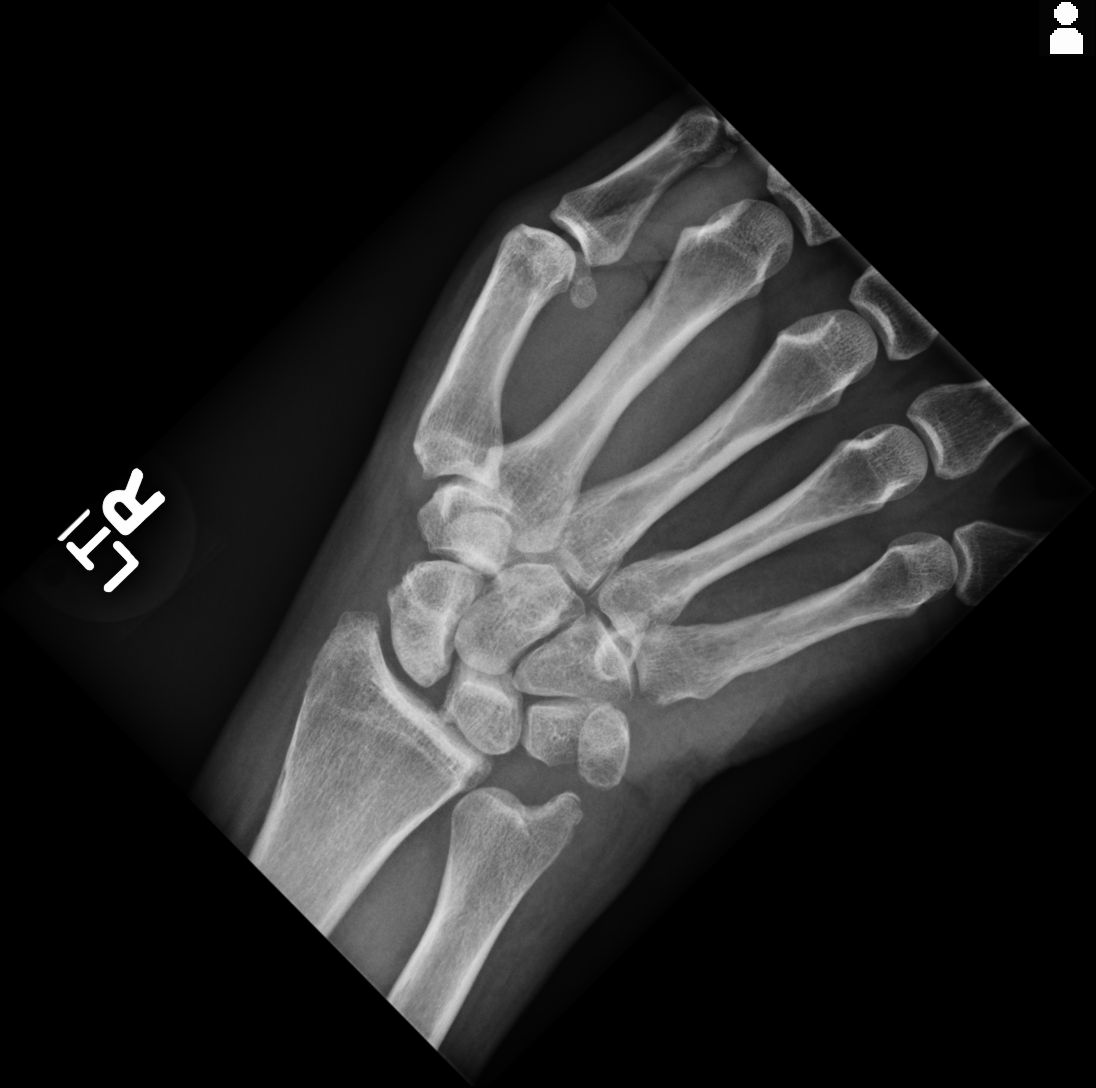
[im 4/4]
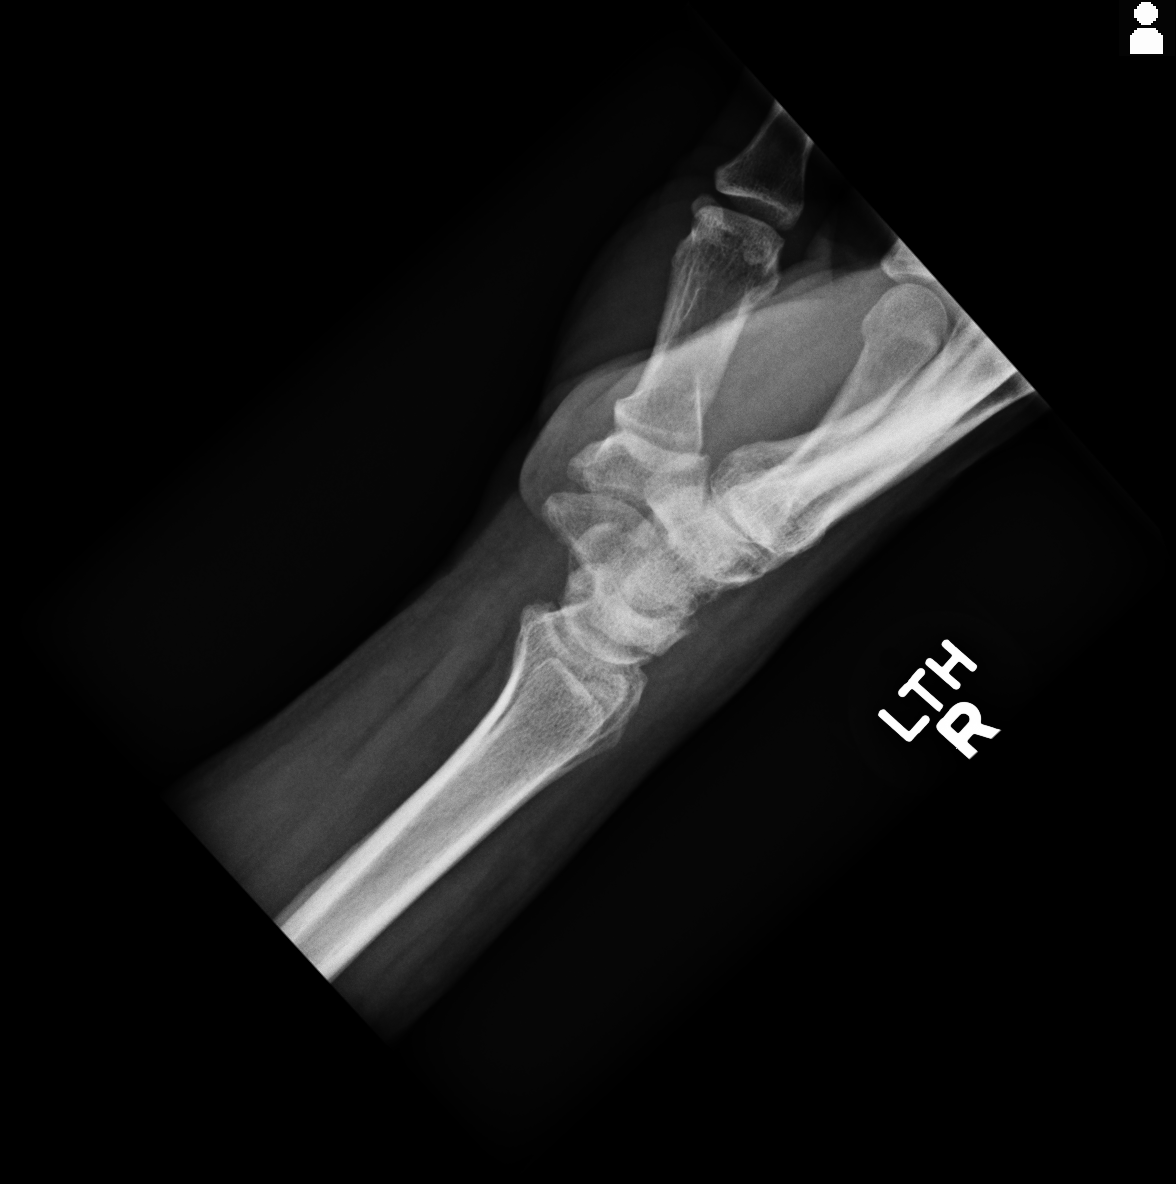

[4 of 4 positions shown; findings below may reference images not displayed]

FINDINGS: Scaphoid bone appears intact. No erosive changes are identified.
Negative for chondrocalcinosis. The alignment of the wrist is
anatomic. Mild basal joint of the thumb osteoarthritis.
IMPRESSION: No acute osseous abnormality.

## 2014-09-13 IMAGING — CR RIGHT ELBOW - COMPLETE 3+ VIEW
1 series · 4 of 4 positions shown · non-contrast
Comparison: None.

CLINICAL DATA: Pain.

EXAM:
RIGHT ELBOW - COMPLETE 3+ VIEW

[Series 1: ap · 0.17mm/px · 4 of 4 slices shown]
[im 1/4]
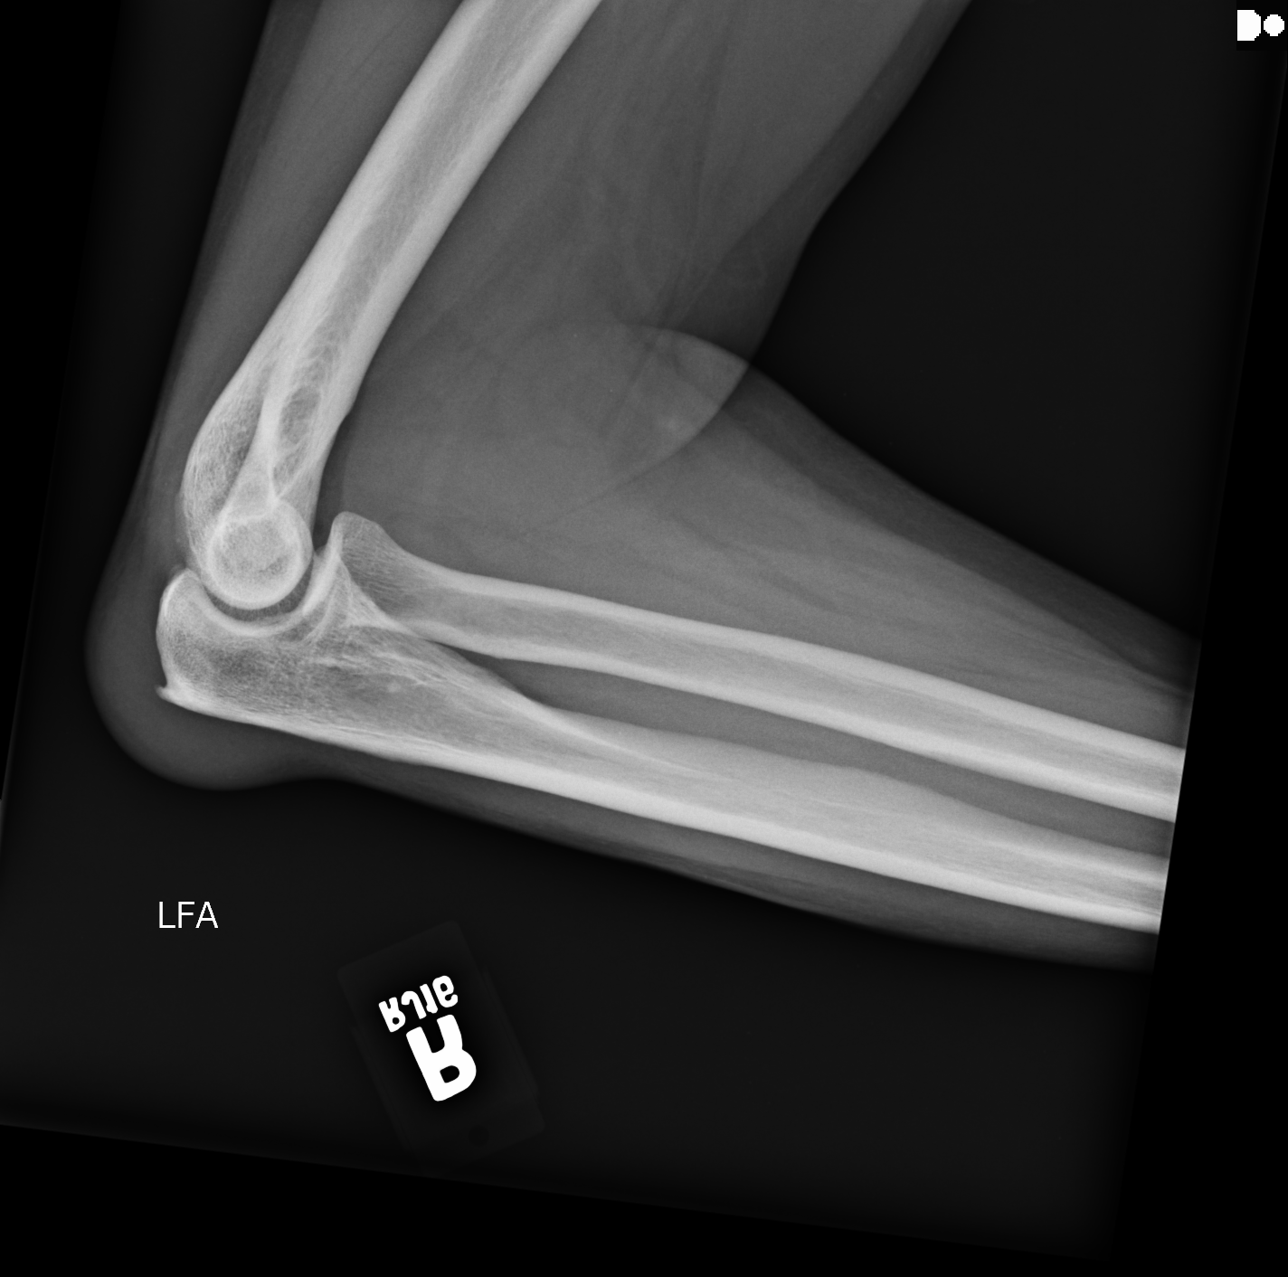
[im 2/4]
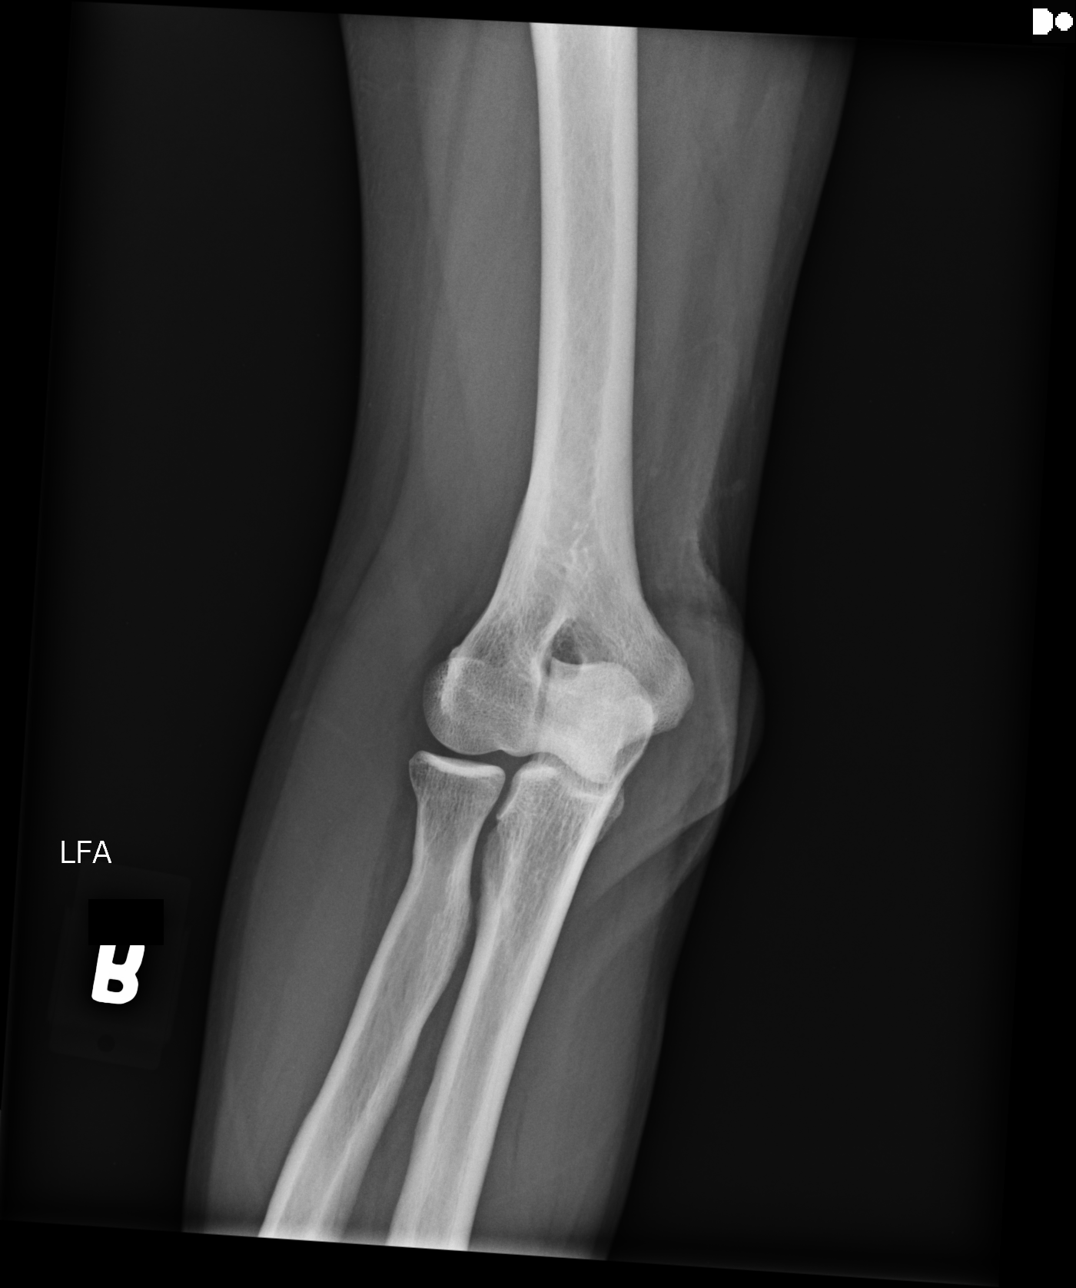
[im 3/4]
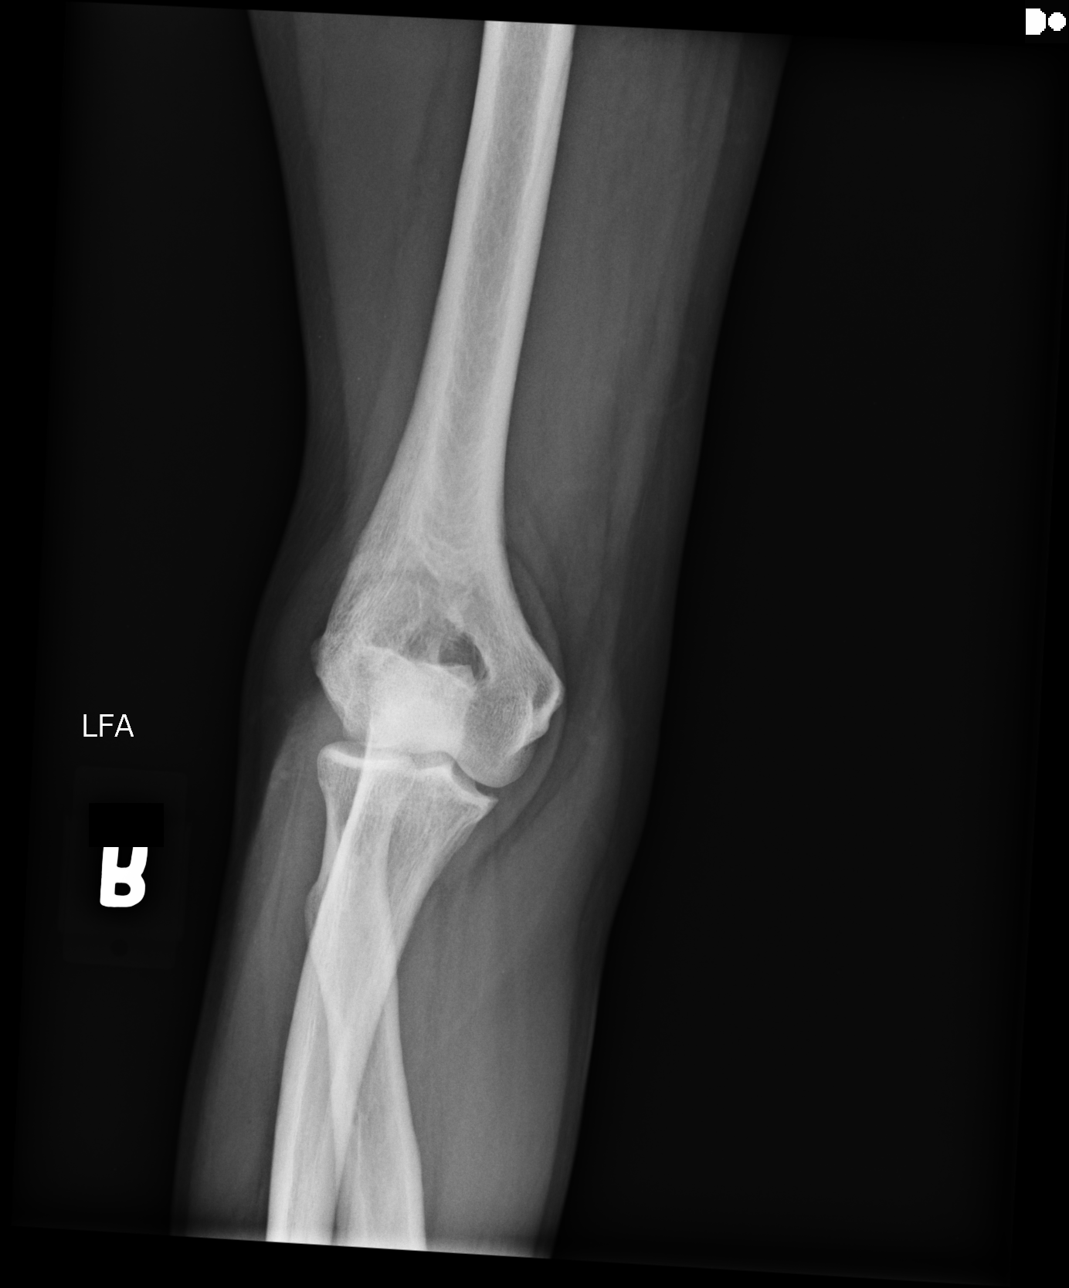
[im 4/4]
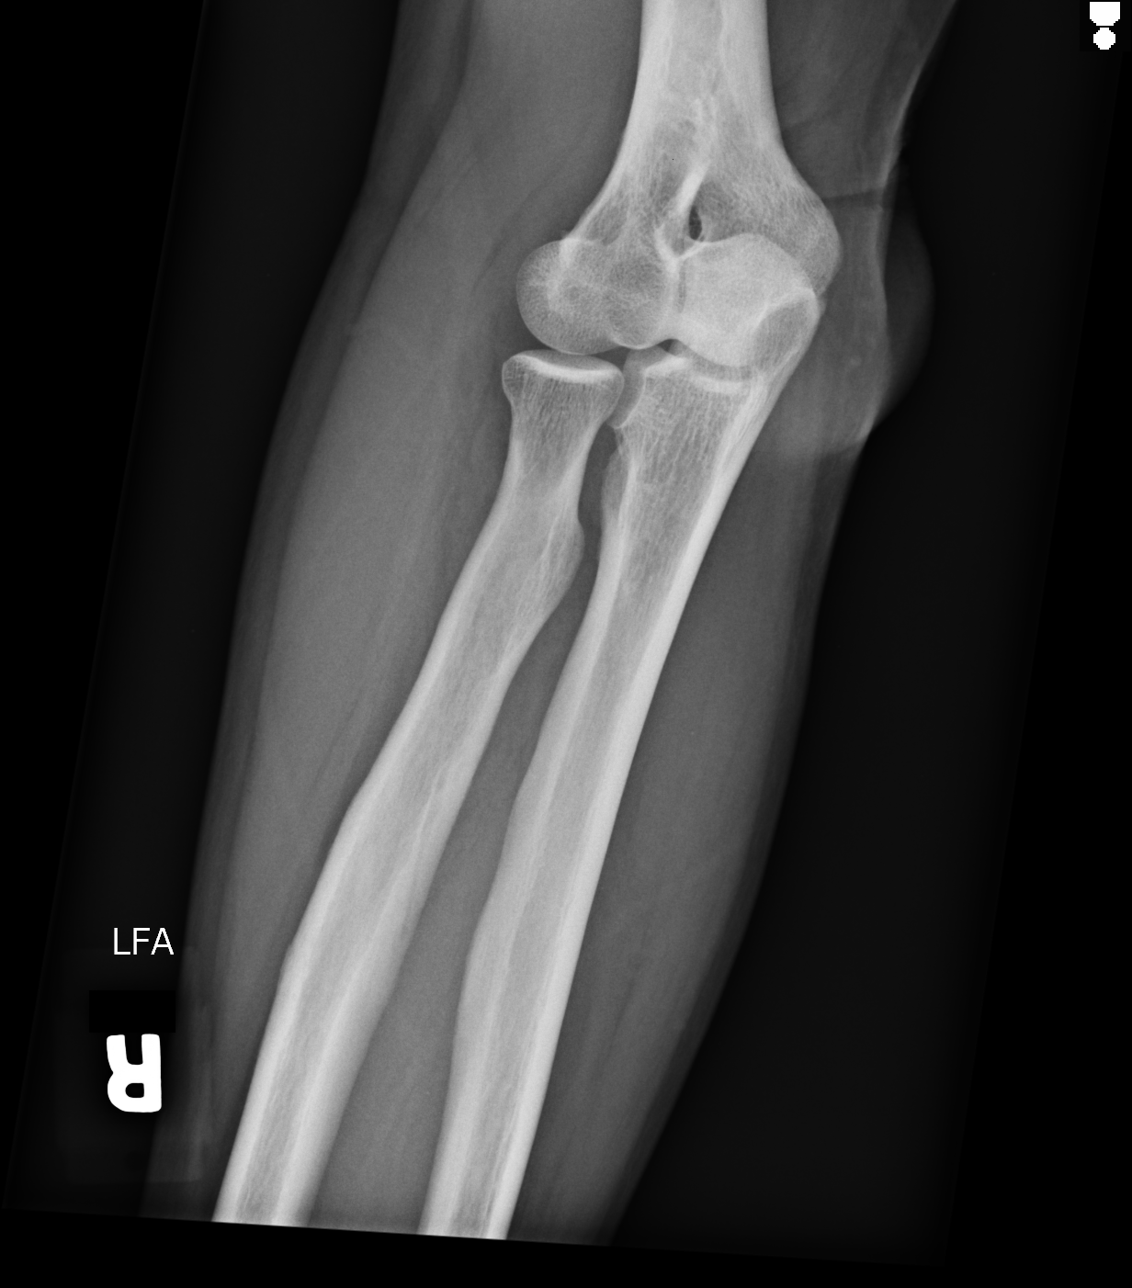

[4 of 4 positions shown; findings below may reference images not displayed]

FINDINGS: Four views of the right elbow reveal the bones to be adequately
mineralized. There is no evidence of an acute fracture or
dislocation. There is a small olecranon spur. There is no joint
effusion. No abnormal soft tissue calcifications are demonstrated.
IMPRESSION: There is no evidence of an acute fracture nor dislocation. There is
a small olecranon spur.

## 2014-09-14 IMAGING — CR RIGHT FOOT COMPLETE - 3+ VIEW
1 series · 3 of 3 positions shown · non-contrast
Comparison: None.

CLINICAL DATA: Status post fall with right foot pain

EXAM:
RIGHT FOOT COMPLETE - 3+ VIEW

[Series 1: ap · 0.17mm/px · 3 of 3 slices shown]
[im 1/3]
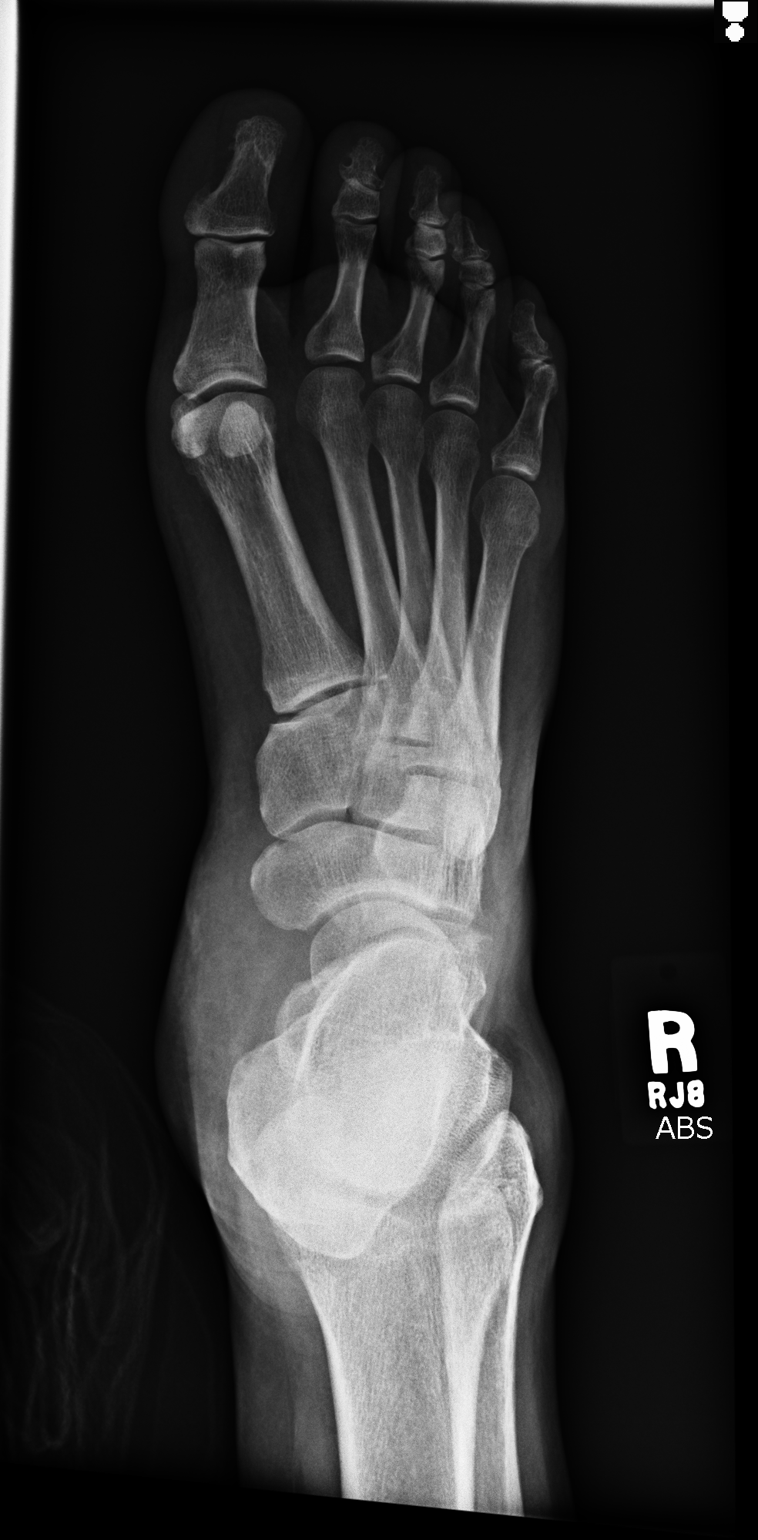
[im 2/3]
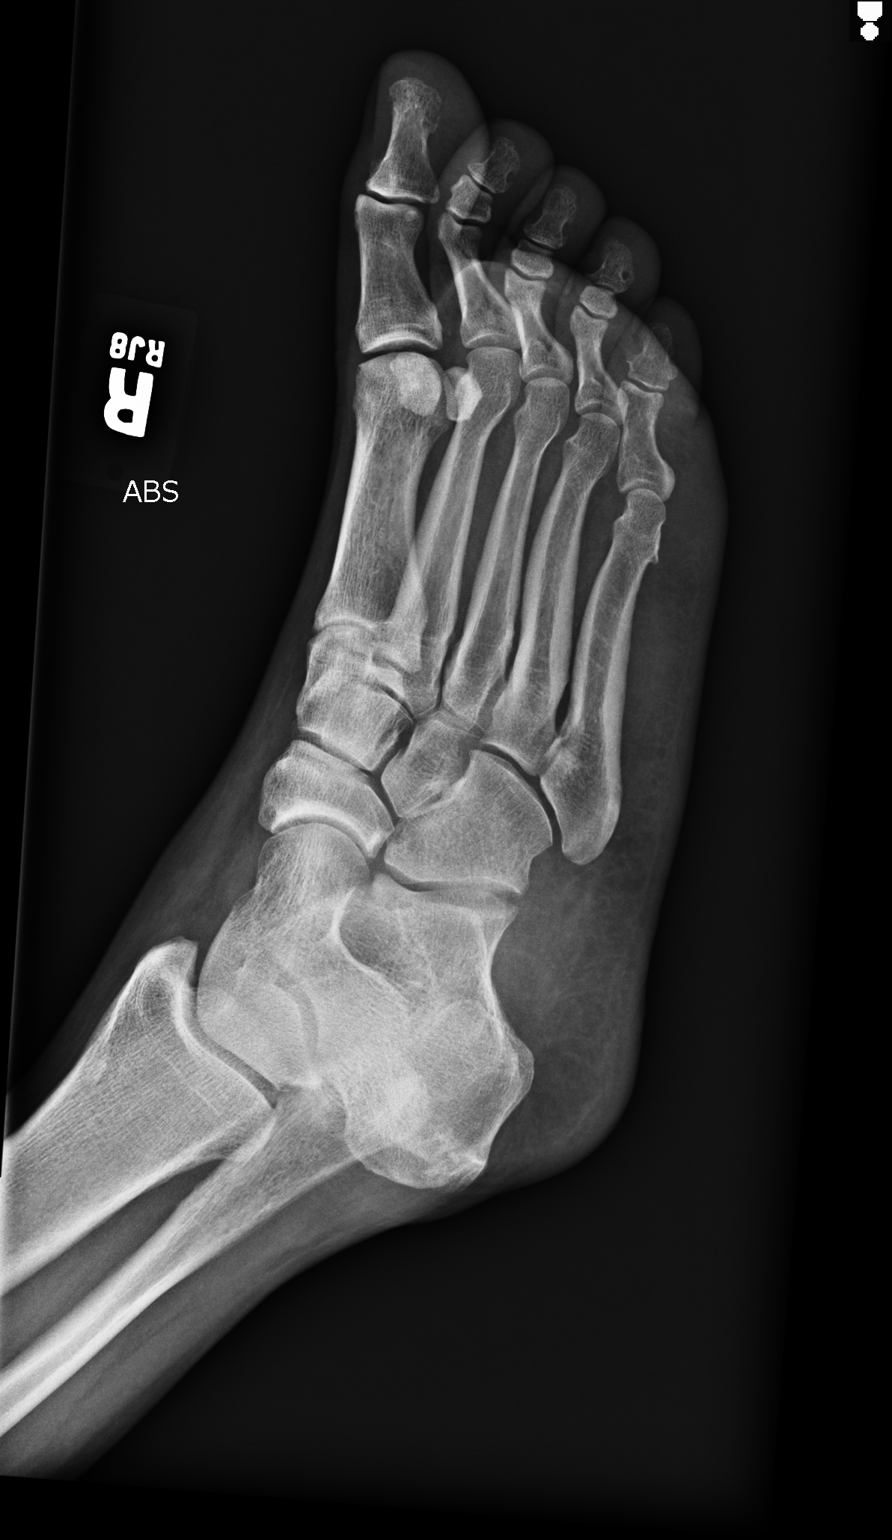
[im 3/3]
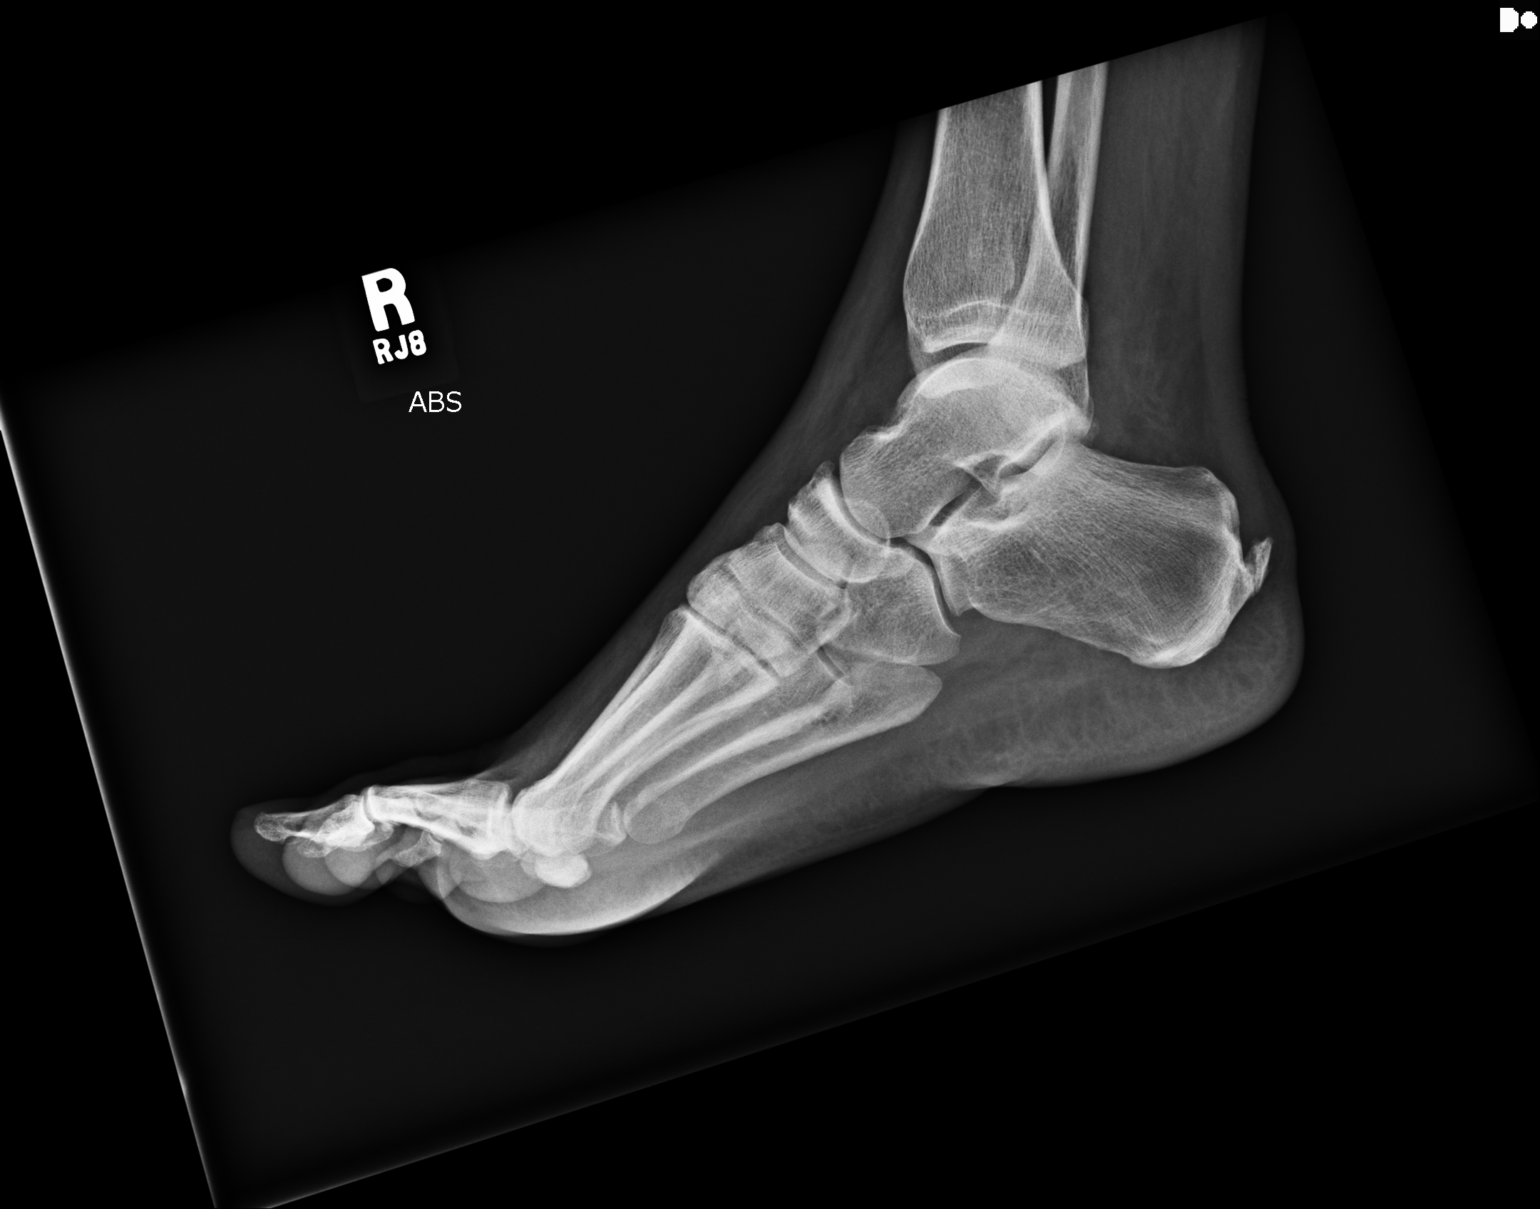

[3 of 3 positions shown; findings below may reference images not displayed]

FINDINGS: There is no evidence of fracture or dislocation. Calcification at
the Achilles tendon insertion to the calcaneus is noted. Soft
tissues are unremarkable.
IMPRESSION: No acute fracture or dislocation.

## 2014-09-15 ENCOUNTER — Emergency Department: Payer: Self-pay | Admitting: Emergency Medicine

## 2014-09-16 ENCOUNTER — Emergency Department: Payer: Self-pay | Admitting: Emergency Medicine

## 2014-09-24 ENCOUNTER — Emergency Department: Payer: Self-pay | Admitting: Internal Medicine

## 2014-09-27 IMAGING — CT CT ABD-PELV W/ CM
2 of 5 series · 17 of 46 positions shown, 19 images · IV contrast (isovue)
Comparison: September 27, 2013

CLINICAL DATA: Right lower quadrant and right flank pain.

EXAM:
CT ABDOMEN AND PELVIS WITH CONTRAST
TECHNIQUE: Multidetector CT imaging of the abdomen and pelvis was performed
using the standard protocol following bolus administration of
intravenous contrast.
CONTRAST:  100 mL Isovue-CMM

[Series 2: routine abd pel with · axial · 0.86mm/px · z∈[-502,-82]mm · 14 of 94 slices shown, 16 images]
[im 5/94  soft-tissue]
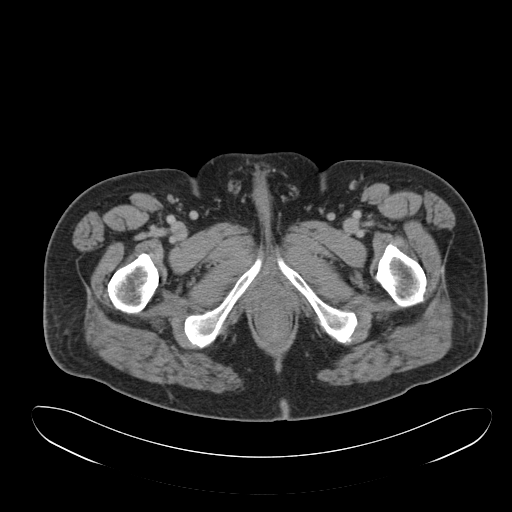
[im 5/94  bone]
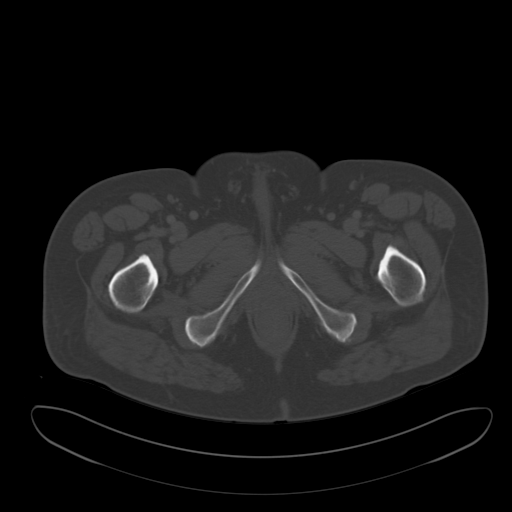
[im 14/94  soft-tissue]
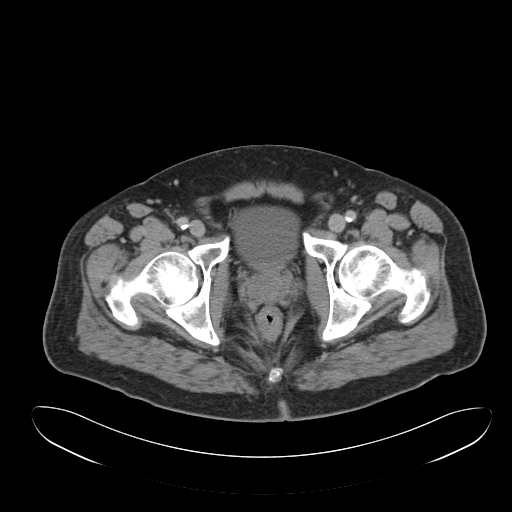
[im 19/94  soft-tissue]
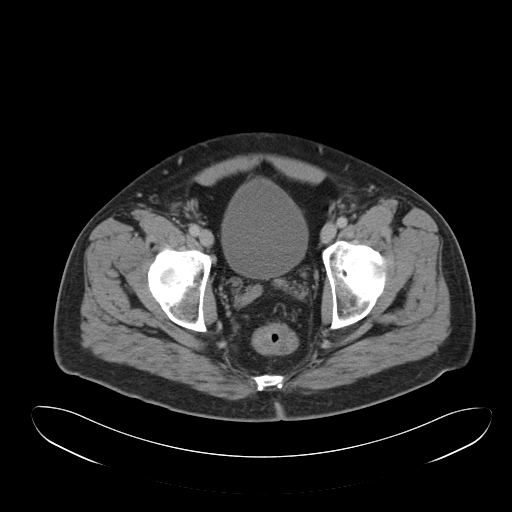
[im 24/94  soft-tissue]
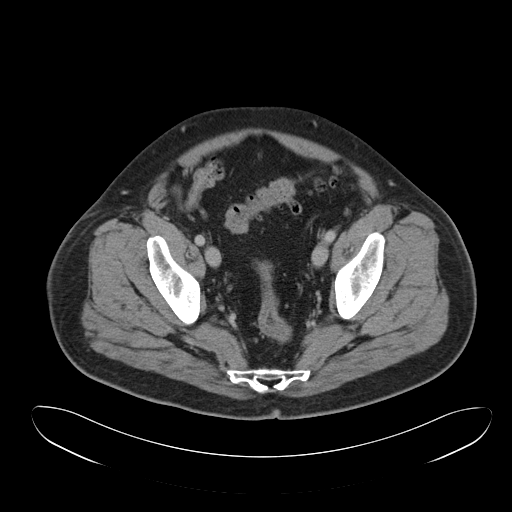
[im 33/94  soft-tissue]
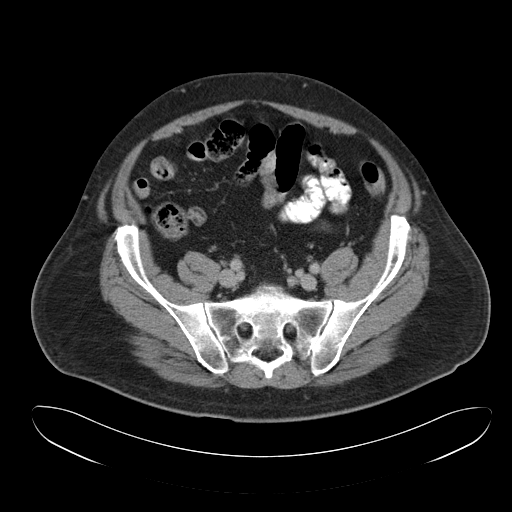
[im 38/94  soft-tissue]
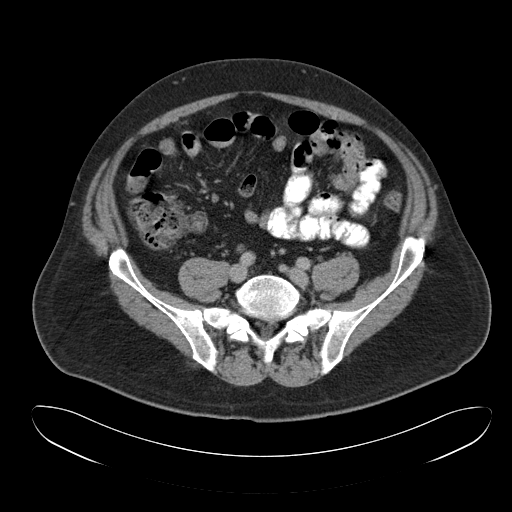
[im 42/94  soft-tissue]
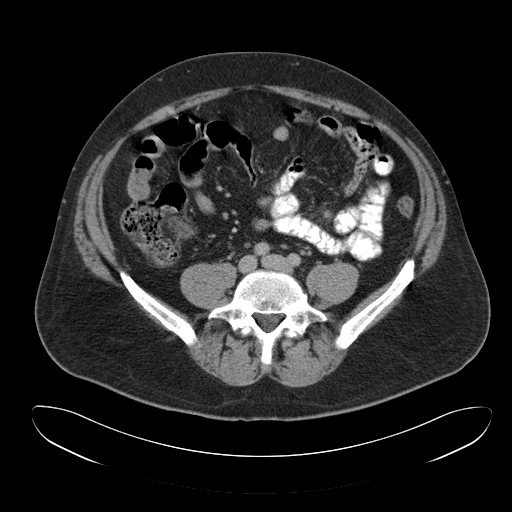
[im 52/94  soft-tissue]
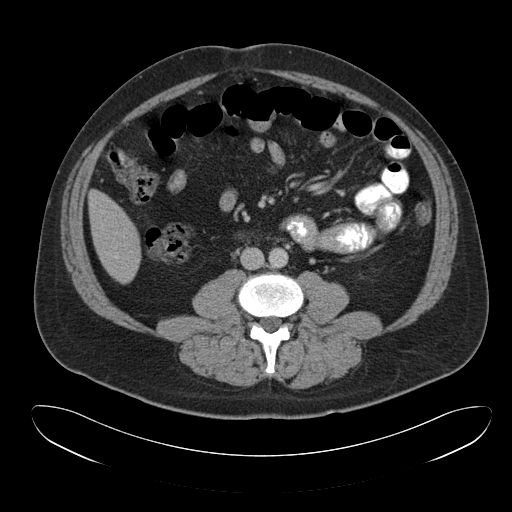
[im 56/94  soft-tissue]
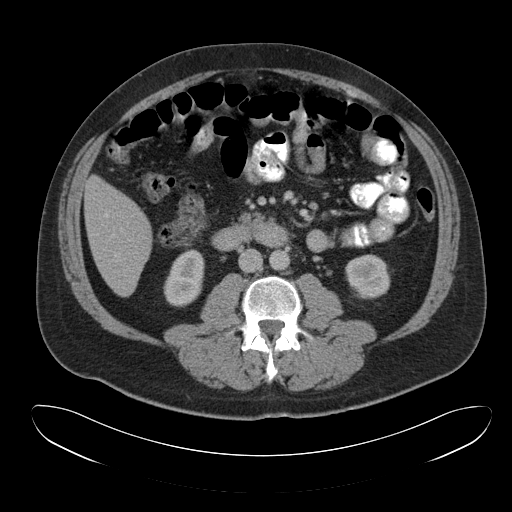
[im 56/94  bone]
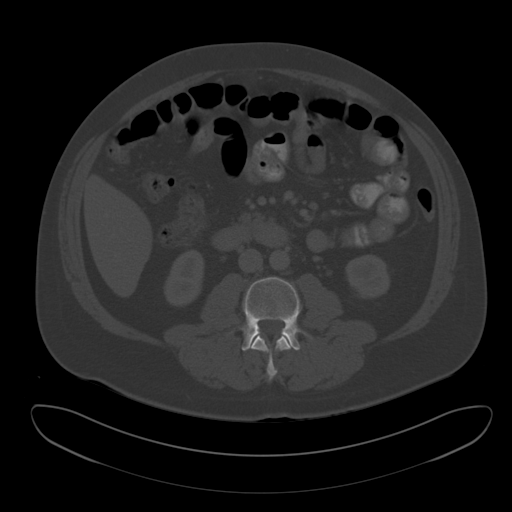
[im 61/94  soft-tissue]
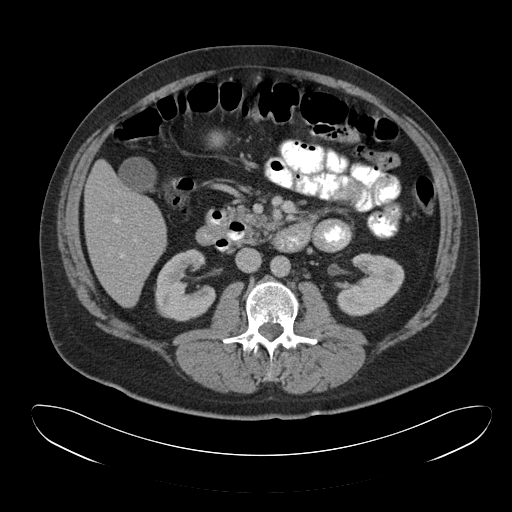
[im 70/94  soft-tissue]
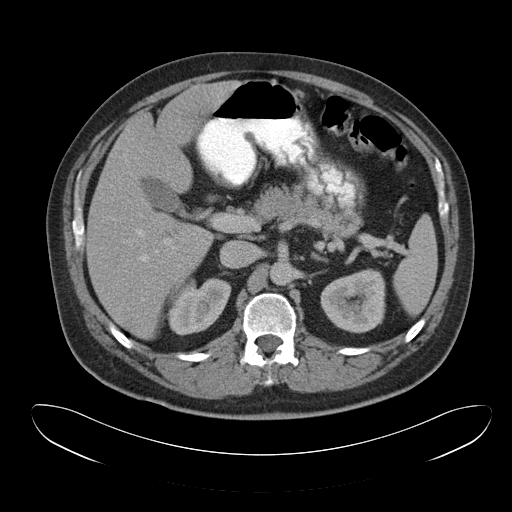
[im 75/94  soft-tissue]
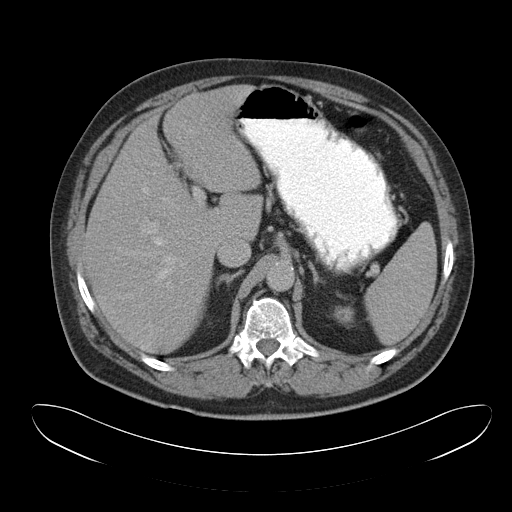
[im 80/94  soft-tissue]
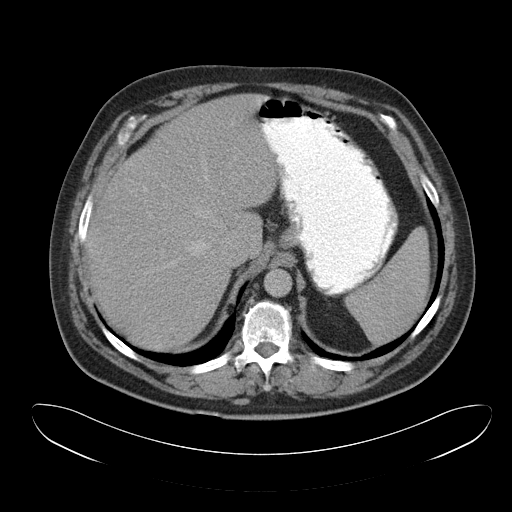
[im 89/94  soft-tissue]
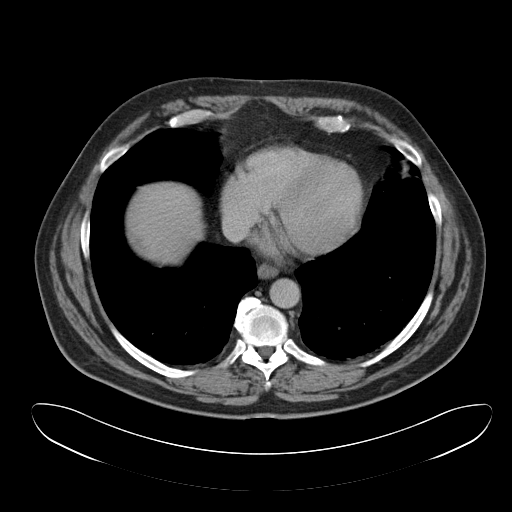

[Series 5: cor routine abd pel with · coronal · 0.80mm/px · 3 of 155 slices shown]
[im 52/155  soft-tissue]
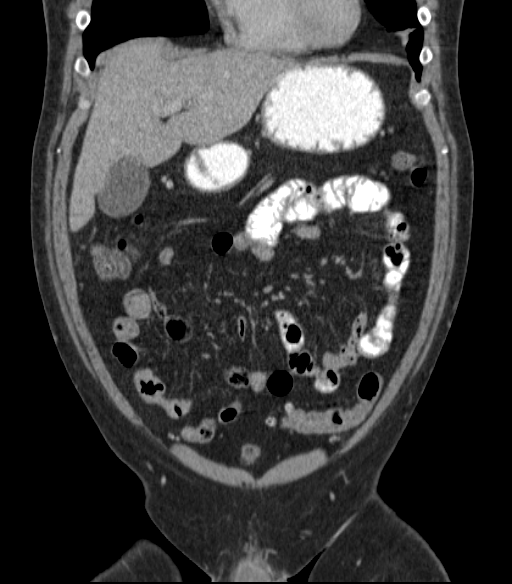
[im 69/155  soft-tissue]
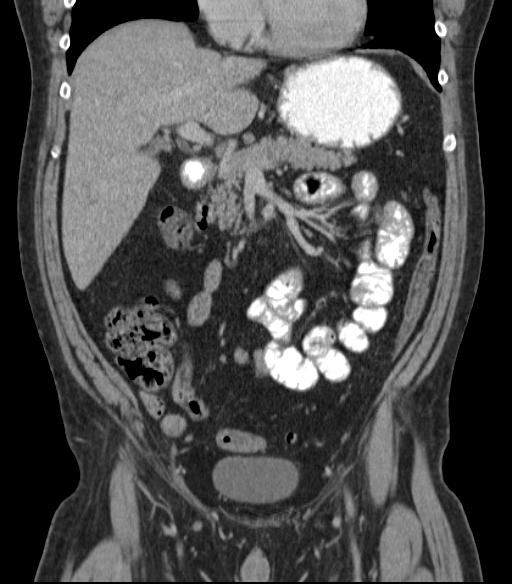
[im 86/155  soft-tissue]
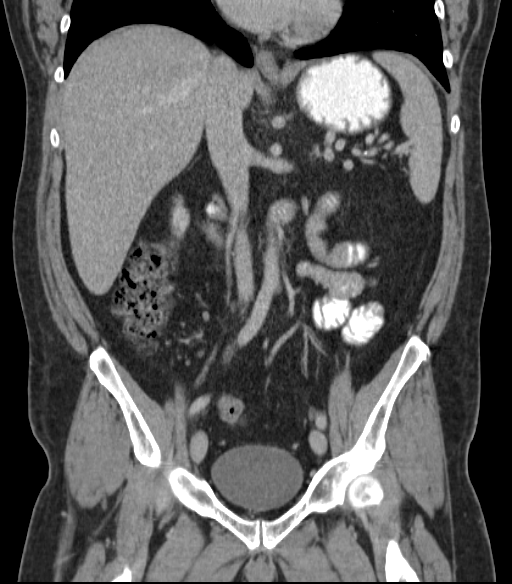

[17 of 46 positions shown; findings below may reference images not displayed]

FINDINGS: The previously described small liver cysts are not well seen on this
exam. There is mild diffuse fatty infiltration of liver. The spleen,
pancreas, gallbladder, adrenal glands and right kidney are normal.
There is no hydronephrosis bilaterally. There are small
nonobstructing stones within the left kidney. There is
atherosclerosis of the abdominal aorta without aneurysmal
dilatation. There is no abdominal lymphadenopathy. There is no small
bowel obstruction. There is diverticulosis of colon without
diverticulitis. The appendix is normal.

Fluid-filled bladder is normal. Prostate calcifications are noted.
Mild dependent atelectasis of the posterior lung bases are noted.
Degenerative joint changes of the spine are noted.
IMPRESSION: No acute abnormality identified in the abdomen and pelvis. No
hydronephrosis bilaterally. Nonobstructing left kidney stones.
Normal appendix.

## 2014-09-27 IMAGING — US ABDOMEN ULTRASOUND
1 series · 13 of 25 positions shown · non-contrast
Comparison: Abdominal CT 09/27/2013.

CLINICAL DATA: Right upper quadrant and right flank pain.

EXAM:
ULTRASOUND ABDOMEN COMPLETE

[Series 1: abdomen ultrasound · 0.31mm/px · 13 of 110 slices shown]
[im 1/110]
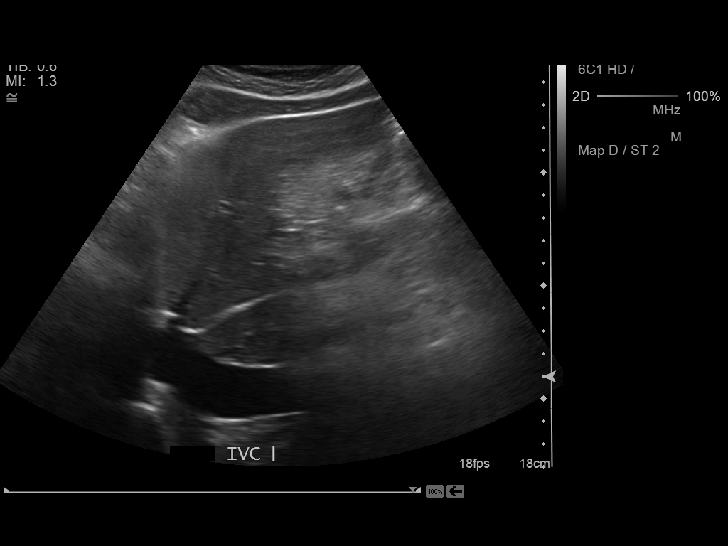
[im 10/110]
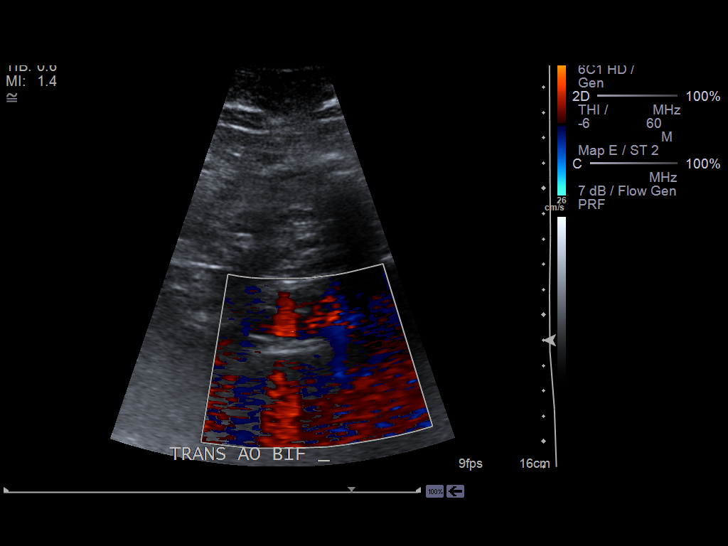
[im 19/110]
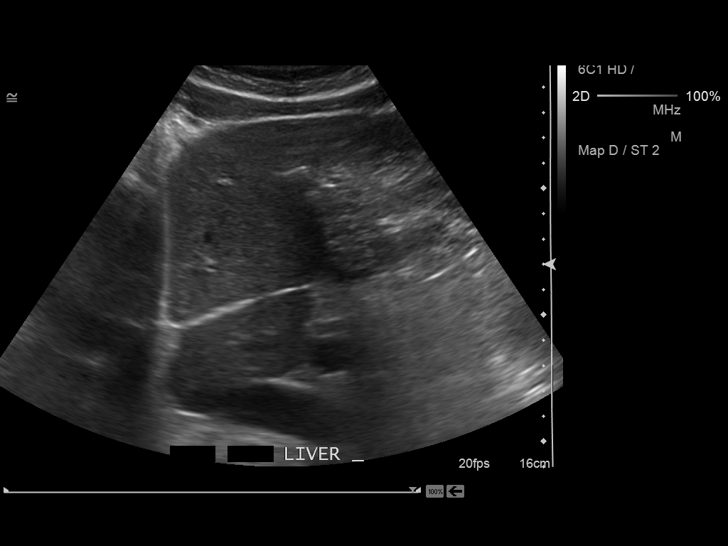
[im 28/110]
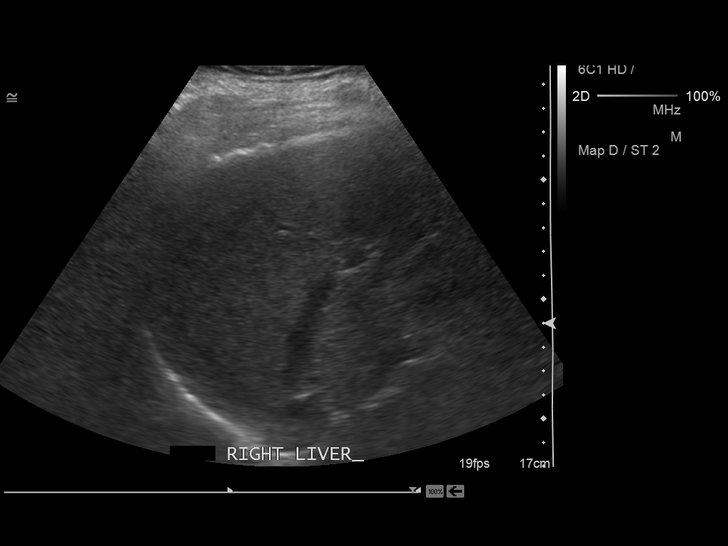
[im 37/110]
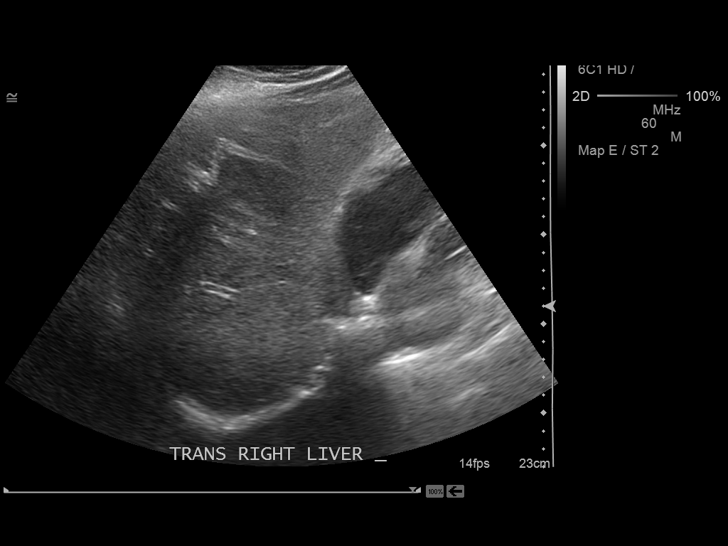
[im 46/110]
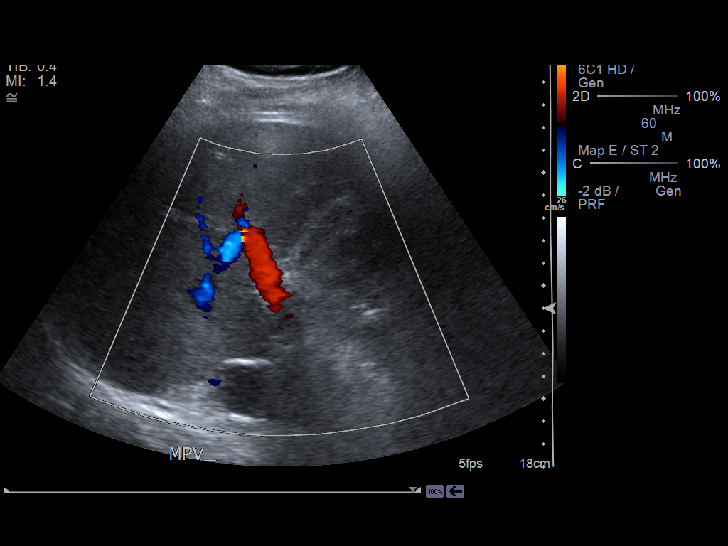
[im 55/110]
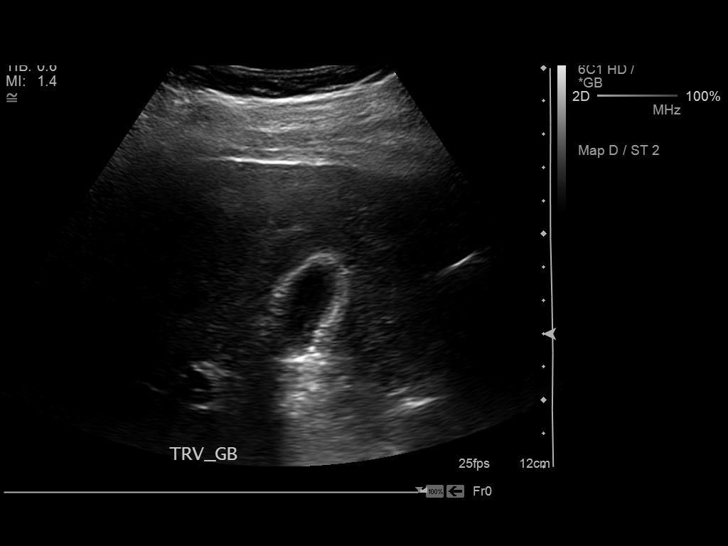
[im 64/110]
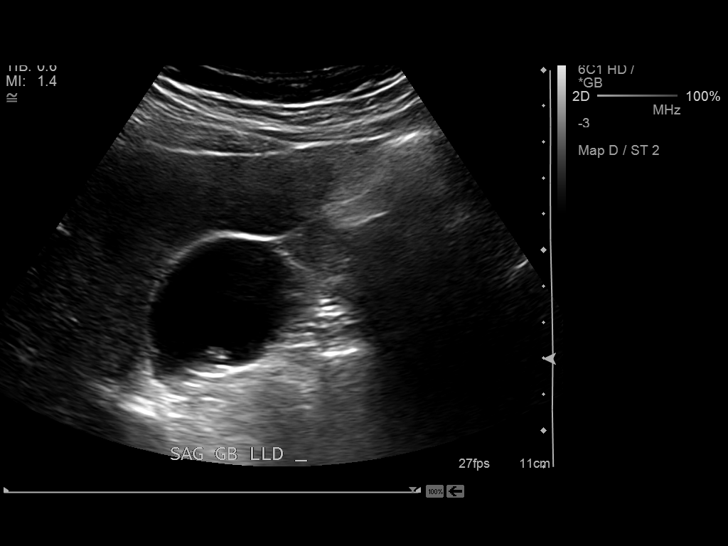
[im 73/110]
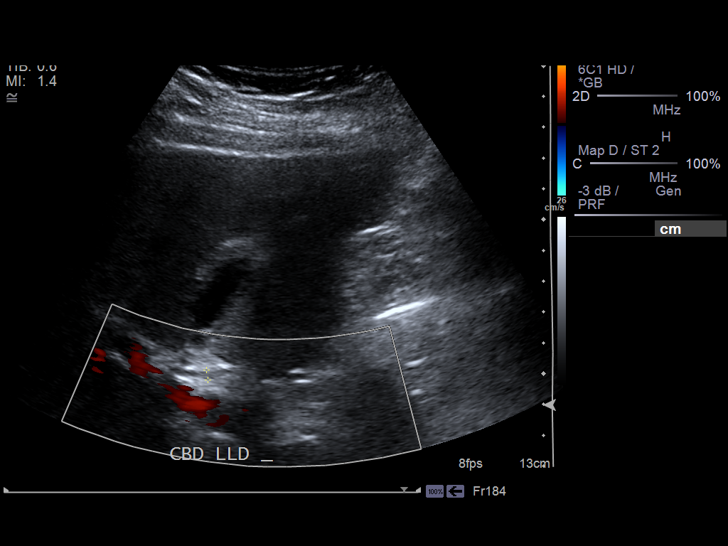
[im 82/110]
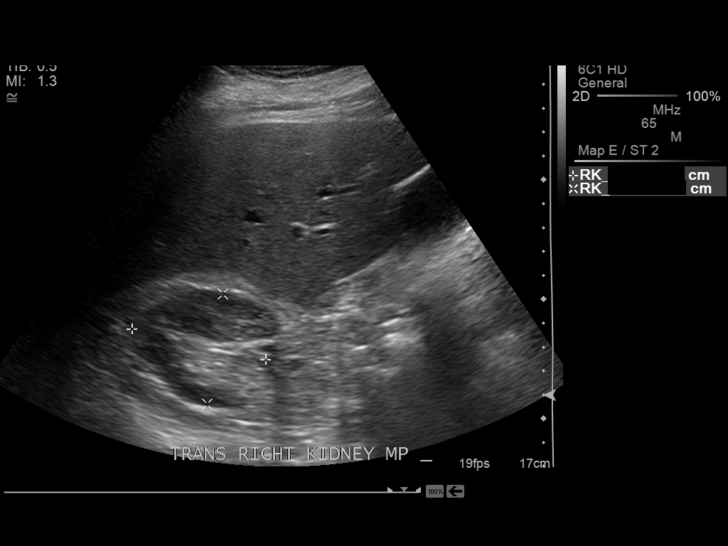
[im 91/110]
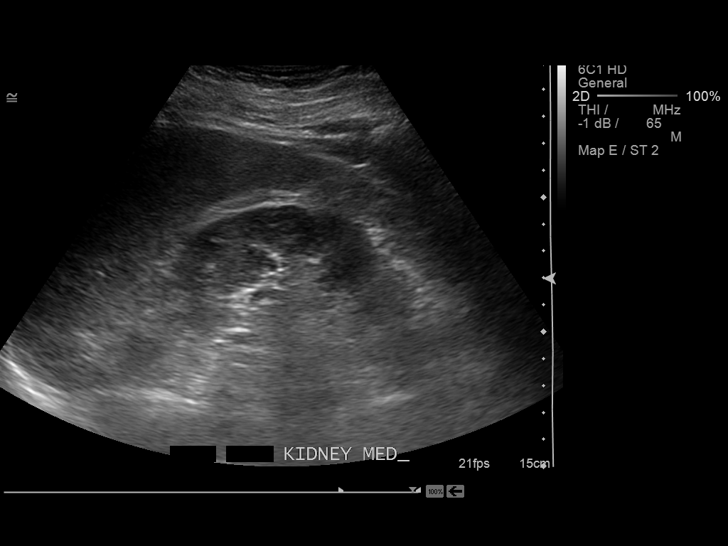
[im 100/110]
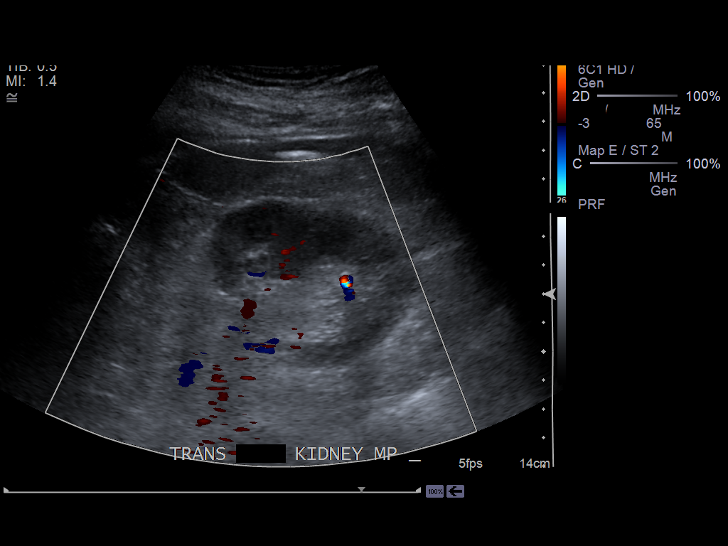
[im 110/110]
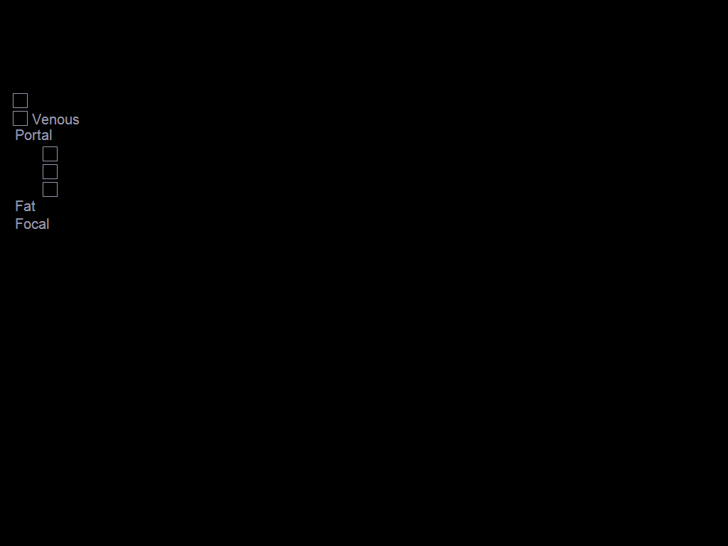

[13 of 25 positions shown; findings below may reference images not displayed]

FINDINGS: Gallbladder:

There are multiple echogenic foci within the gallbladder lumen which
are non mobile and most likely polyps. These measure up to 6 mm in
diameter. No definite gallstones are demonstrated. Evaluation for
sonographic Murphy sign is limited by the patient's medication.

Common bile duct:

Diameter: 3.3 mm

Liver:

No focal lesion identified. Within normal limits in parenchymal
echogenicity.

IVC:

No abnormality visualized.

Pancreas:

Echogenic and somewhat prominent. No focal abnormality identified.
The pancreas is partly obscured by bowel gas.

Spleen:

Size and appearance within normal limits.

Right Kidney:

Length: 11.3 cm. Echogenicity within normal limits. No mass or
hydronephrosis visualized.

Left Kidney:

Length: 12.1 cm. There are several echogenic foci corresponding with
caliceal calculi on prior CT. No hydronephrosis or focal cortical
abnormality identified.

Abdominal aorta:

Portions of the aorta are obscured by bowel gas. No aneurysm
identified.

Other findings:

None.
IMPRESSION: 1. No demonstrated acute findings.
2. Known renal calculi without hydronephrosis.
3. Probable gallbladder polyps.

## 2014-10-24 IMAGING — CT CT ABD-PELV W/ CM
2 of 5 series · 16 of 46 positions shown, 18 images · non-contrast
Comparison: none

[Series 2: routine abd pel with · axial · 0.80mm/px · z∈[-990,-560]mm · 13 of 98 slices shown, 15 images]
[im 6/98  soft-tissue]
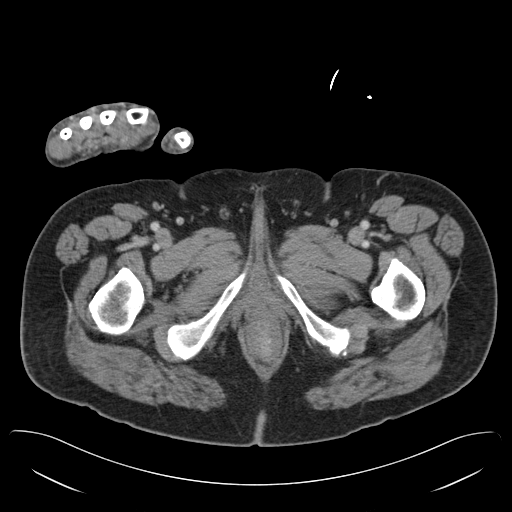
[im 6/98  bone]
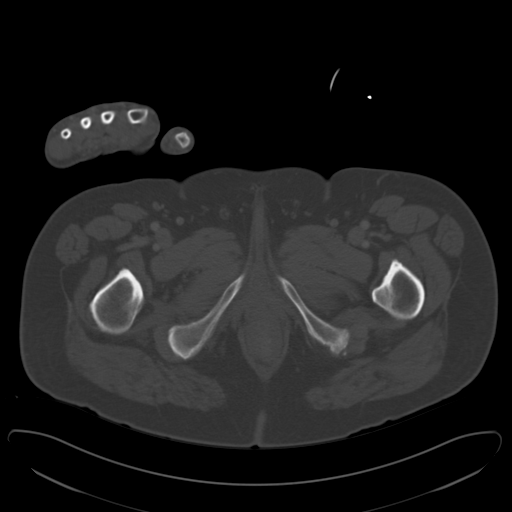
[im 16/98  soft-tissue]
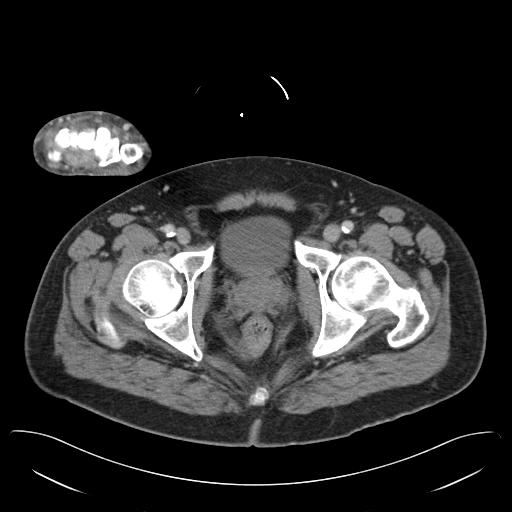
[im 21/98  soft-tissue]
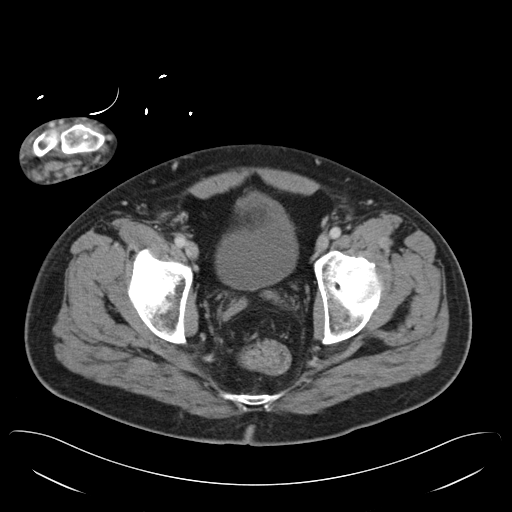
[im 26/98  soft-tissue]
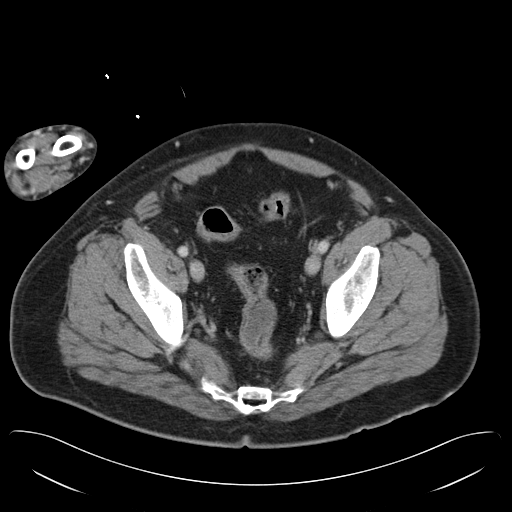
[im 36/98  soft-tissue]
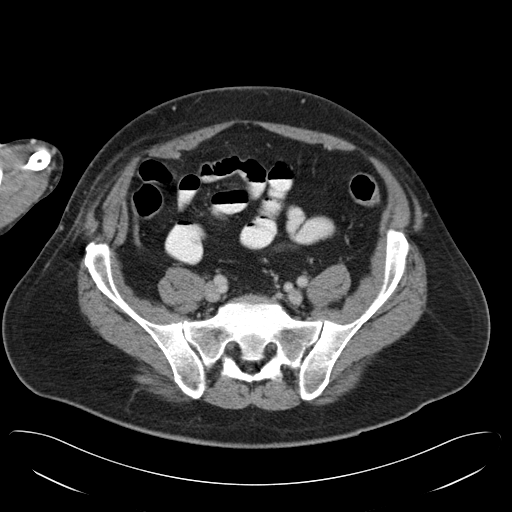
[im 41/98  soft-tissue]
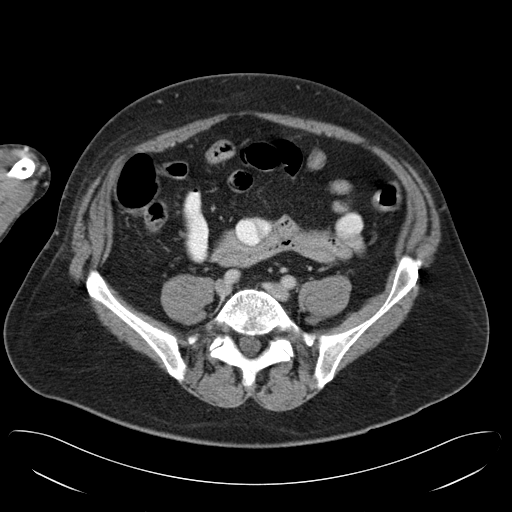
[im 52/98  soft-tissue]
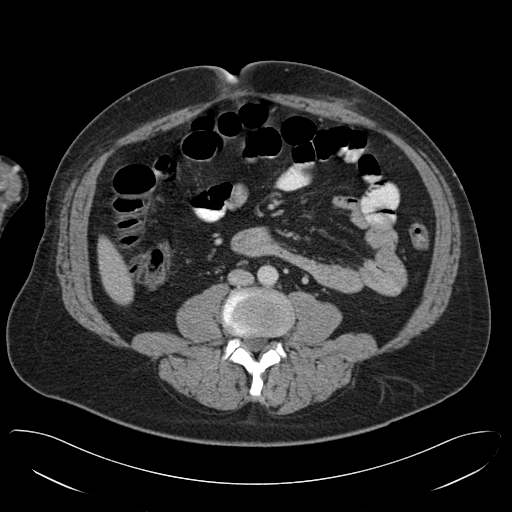
[im 57/98  soft-tissue]
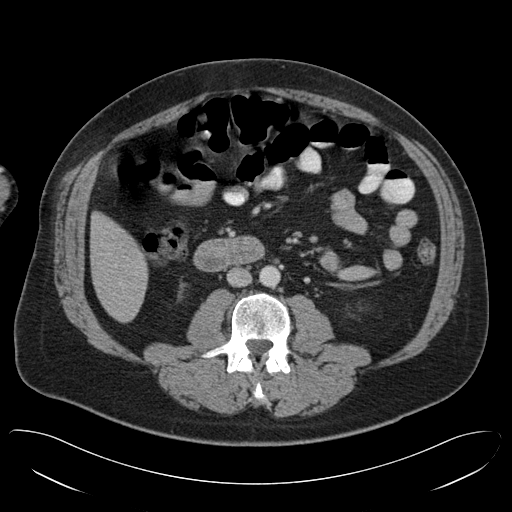
[im 62/98  soft-tissue]
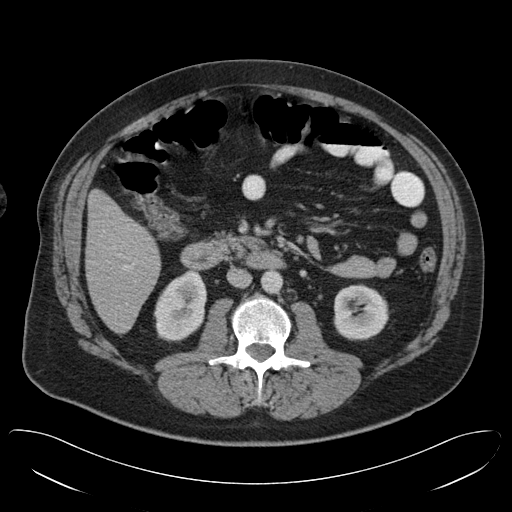
[im 62/98  bone]
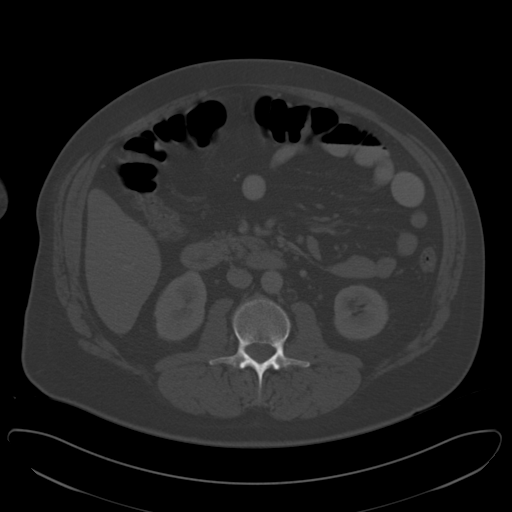
[im 72/98  soft-tissue]
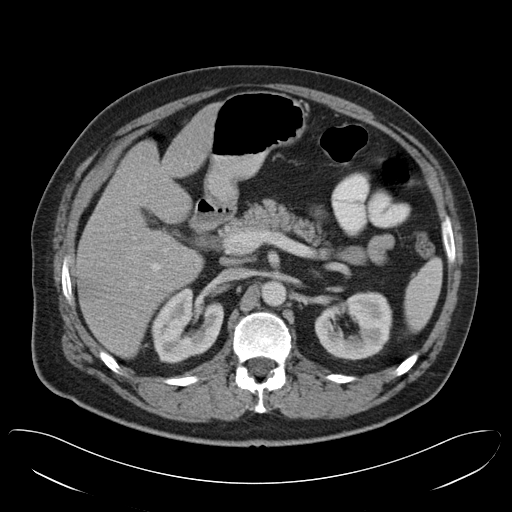
[im 77/98  soft-tissue]
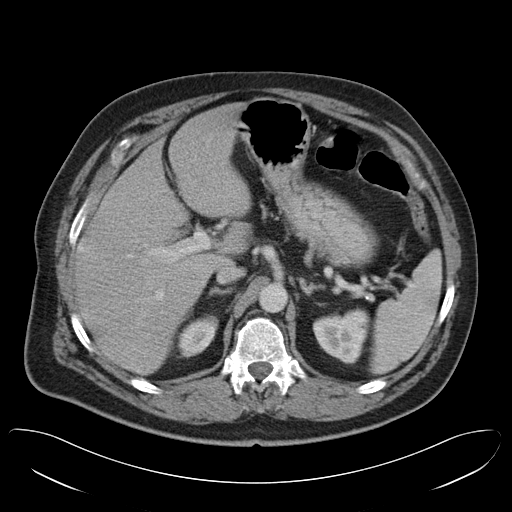
[im 82/98  soft-tissue]
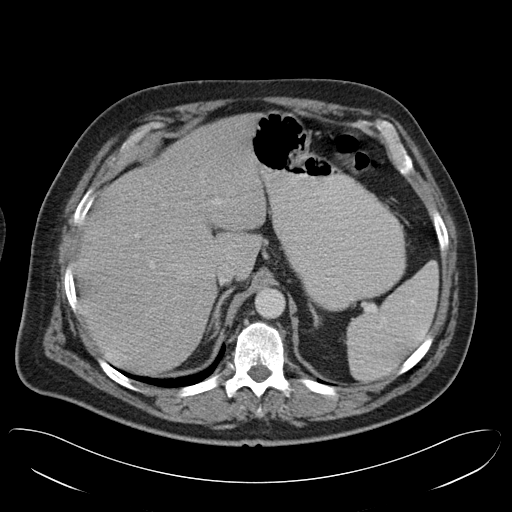
[im 92/98  soft-tissue]
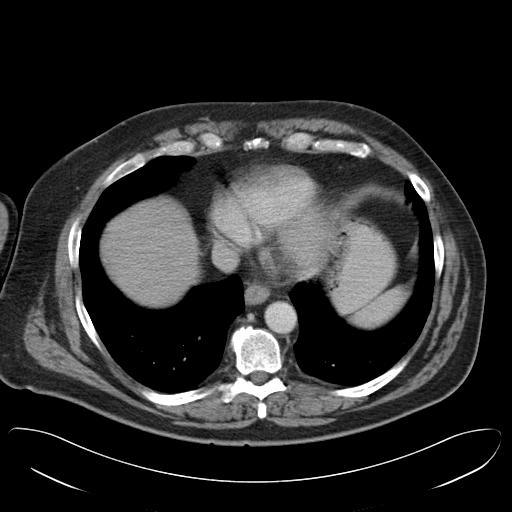

[Series 5: cor routine abd pel with · coronal · 0.79mm/px · 3 of 139 slices shown]
[im 47/139  soft-tissue]
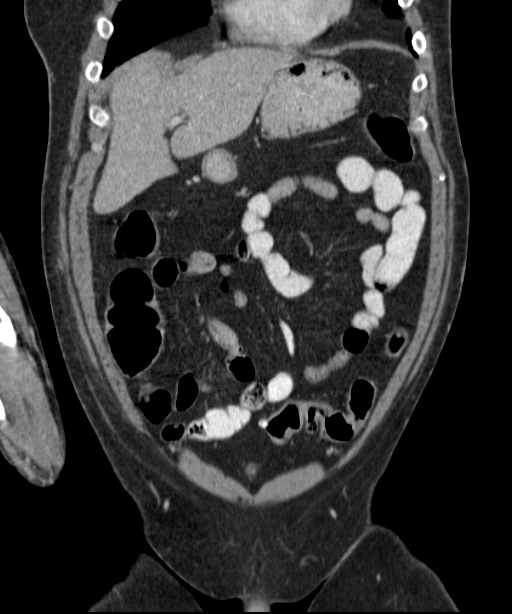
[im 62/139  soft-tissue]
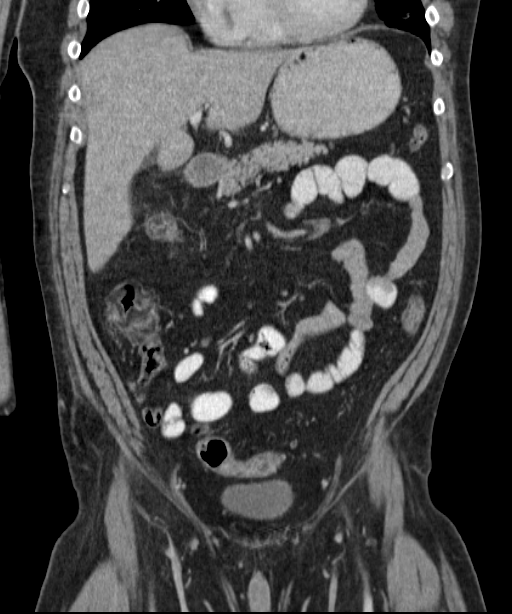
[im 77/139  soft-tissue]
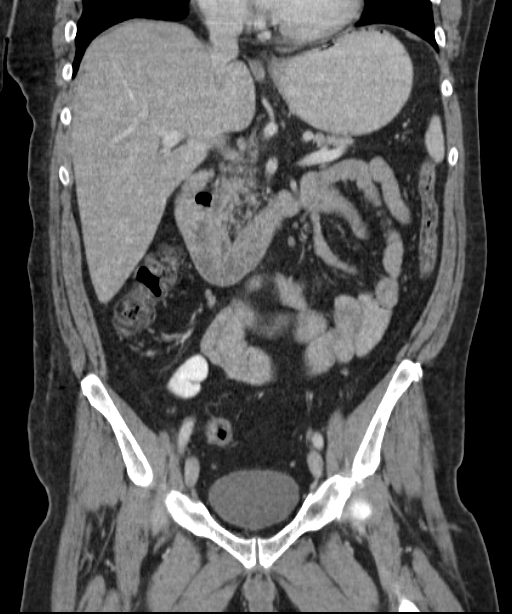

[16 of 46 positions shown; findings below may reference images not displayed]

CLINICAL DATA
Gallbladder removed on [REDACTED].  Increasing nausea and pain.

EXAM
CT ABDOMEN AND PELVIS WITH CONTRAST

TECHNIQUE
Multidetector CT imaging of the abdomen and pelvis was performed
using the standard protocol following bolus administration of
intravenous contrast.

CONTRAST
100 mL Isovue 370

COMPARISON
CT ABD-PELV W/ CM dated 12/27/2013; US ABDOMEN COMPLETE dated
12/27/2013

FINDINGS
The lung bases are clear.

The liver demonstrates no focal abnormality. There is no
intrahepatic or extrahepatic biliary ductal dilatation. The
gallbladder is surgically absent. There is no fluid collection in
the gallbladder fossa.. The spleen demonstrates no focal
abnormality. There are nonobstructing left nephrolithiasis. The
right kidney, adrenal glands and pancreas are normal. The bladder is
unremarkable.

There are post laparoscopy changes in the anterior abdominal wall
and subcutaneous fat. There is a fat containing supraumbilical
hernia.

The stomach, duodenum, small intestine, and large intestine
demonstrate no contrast extravasation or dilatation. Incidental note
is made of a duodenal diverticulum. There is a normal caliber
appendix in the right lower quadrant without periappendiceal
inflammatory changes. There is no pneumoperitoneum, pneumatosis, or
portal venous gas. There is no abdominal or pelvic free fluid. There
is no lymphadenopathy.

The abdominal aorta is normal in caliber.

There are no lytic or sclerotic osseous lesions.

IMPRESSION
1. Interval cholecystectomy. No fluid collection in the gallbladder
fossa.
2. Nonobstructing left nephrolithiasis.
3. Normal appendix.

SIGNATURE

## 2014-11-01 IMAGING — NM NUCLEAR MEDICINE HEPATOHBILIARY INCLUDE GB
1 series · 12 of 12 positions shown · non-contrast
Comparison: None.

CLINICAL DATA: Persistent upper abdominal pain on the right
following cholecystectomy

EXAM:
NUCLEAR MEDICINE HEPATOBILIARY IMAGING
Views:  Anterior right upper quadrant
Radionuclide:  Technetium 99m Choletec
Dose:  8.69 mCi
Route of administration:  Intravenous

[Series 1000: gallbladder statics · 4.80mm/px · 12 of 12 slices shown]
[im 1/12]
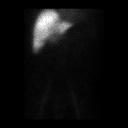
[im 2/12]
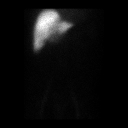
[im 3/12]
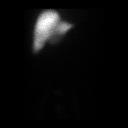
[im 4/12]
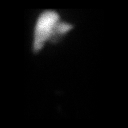
[im 5/12]
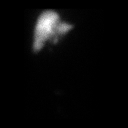
[im 6/12]
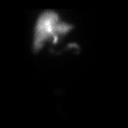
[im 7/12]
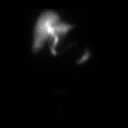
[im 8/12]
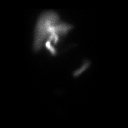
[im 9/12]
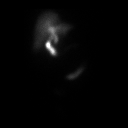
[im 10/12]
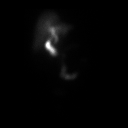
[im 11/12]
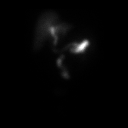
[im 12/12]
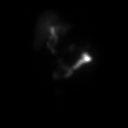

[12 of 12 positions shown; findings below may reference images not displayed]

FINDINGS: Images were obtained over a 90 min time span. Gallbladder is known
to be absent. Liver uptake is normal. Small bowel visualizes
promptly consistent with patency of the common bile duct. There is
no ectopic radiotracer to suggest bile leak.
IMPRESSION: No evidence of bile leak. Common bile duct patent. Gallbladder
absent.

## 2014-11-29 IMAGING — CR DG CHEST 2V
1 series · 2 of 2 positions shown · non-contrast
Comparison: DG CHEST 2V dated 07/04/2013

CLINICAL DATA: pt c/o wheezing/cough/SOB, hx asthma, former smoker

EXAM:
CHEST  2 VIEW

[Series 1: w chest pa · 0.14mm/px · 2 of 2 slices shown]
[im 1/2]
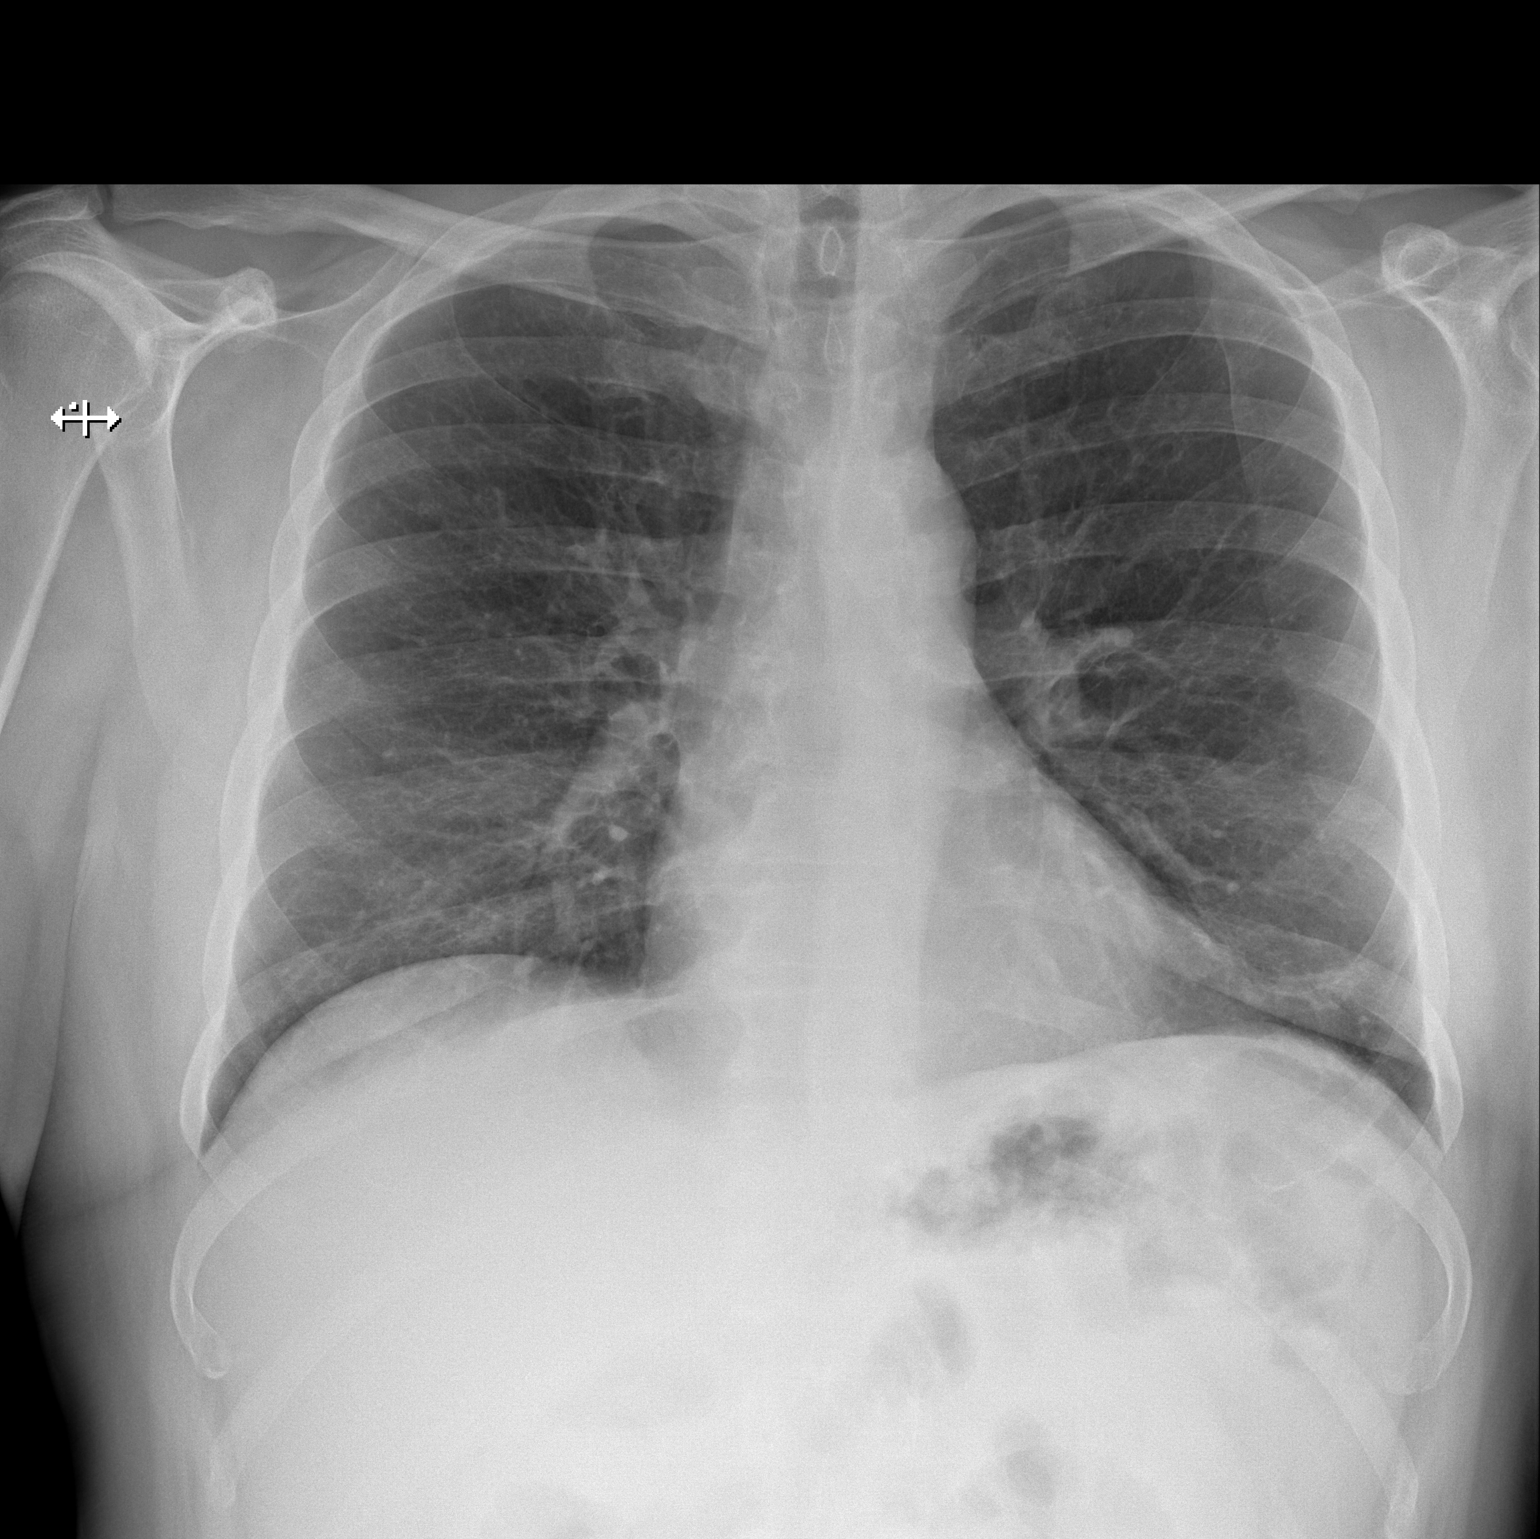
[im 2/2]
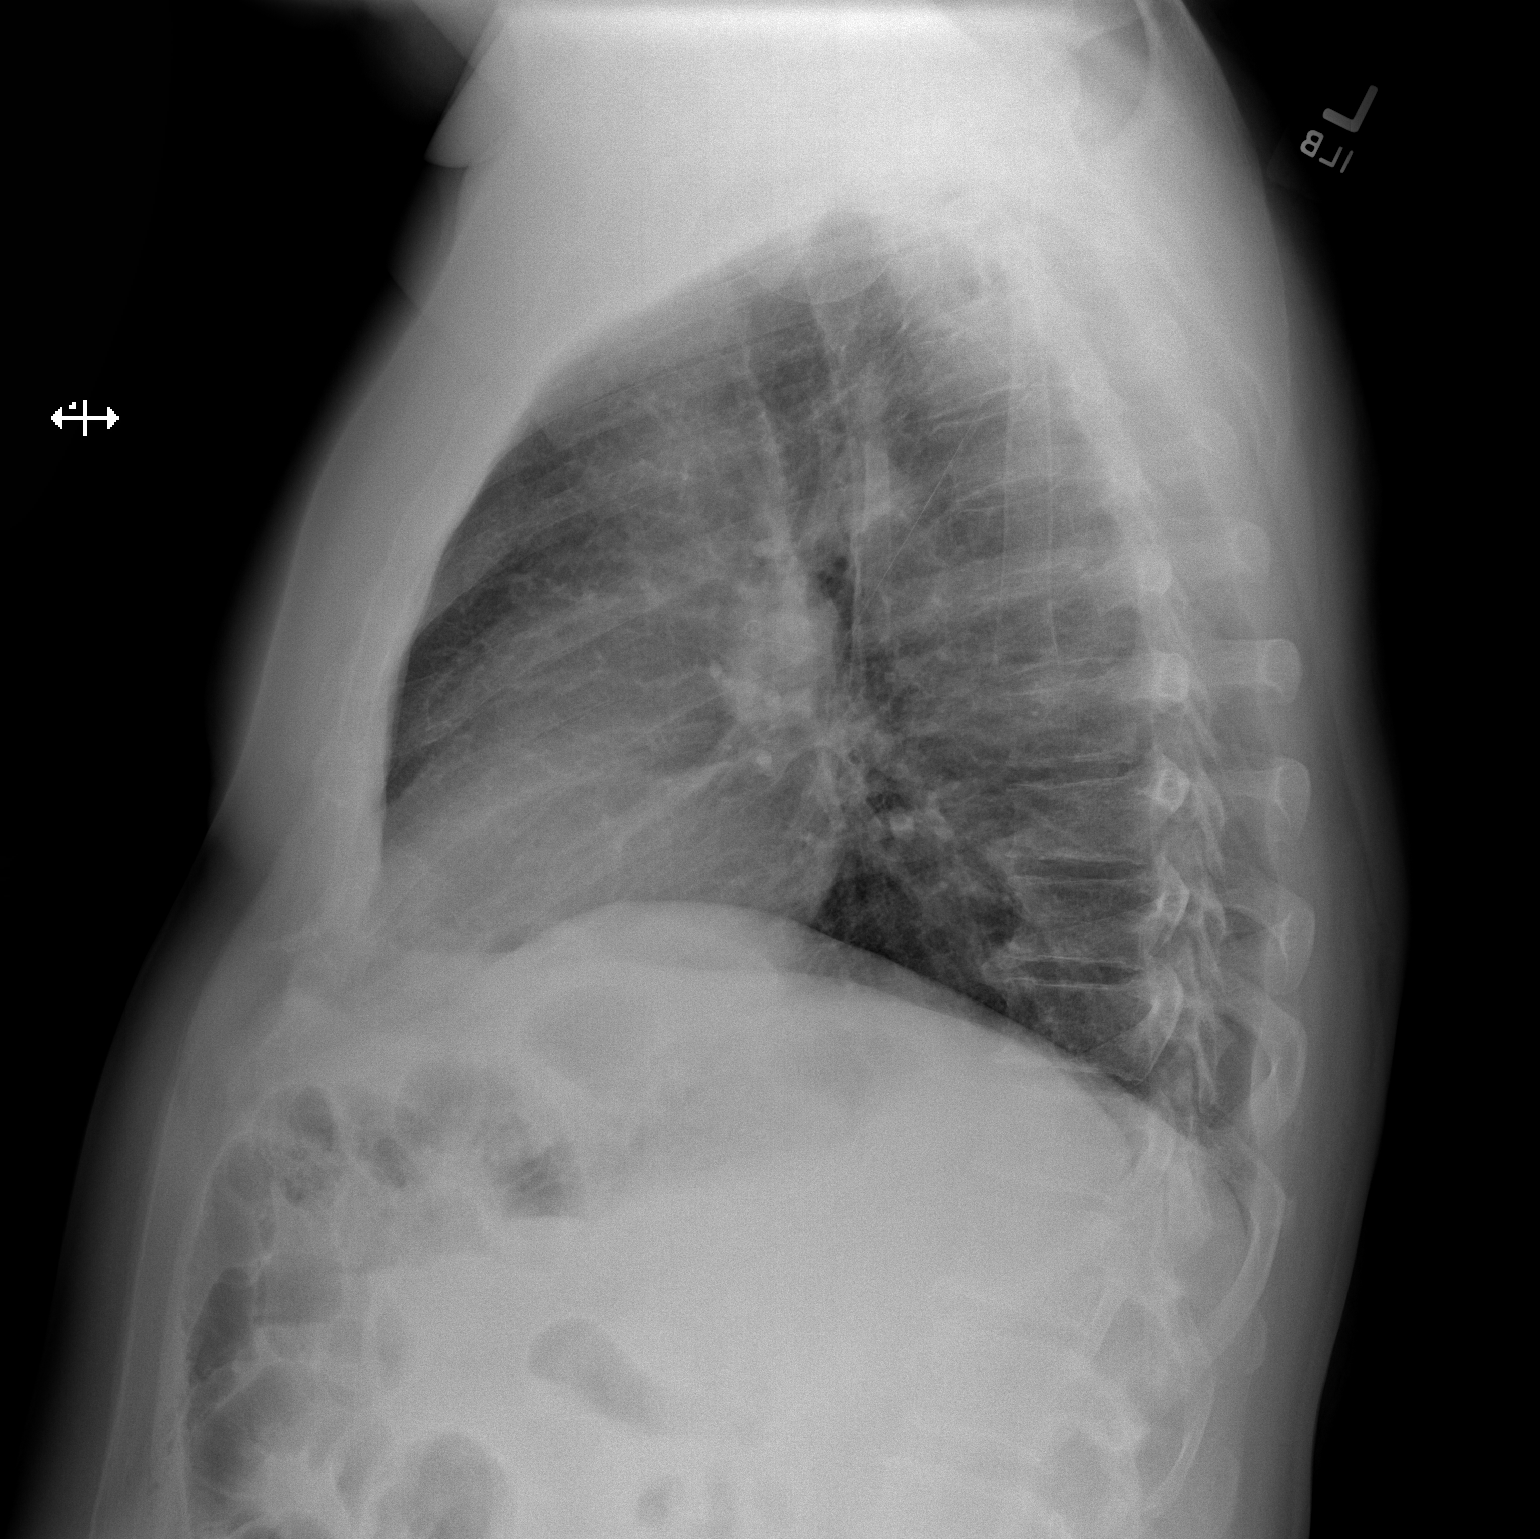

[2 of 2 positions shown; findings below may reference images not displayed]

FINDINGS: The heart size and mediastinal contours are within normal limits.
Both lungs are clear. The visualized skeletal structures are
unremarkable. Discoid atelectasis versus scarring left lung base.
IMPRESSION: No active cardiopulmonary disease.

## 2014-12-26 ENCOUNTER — Emergency Department: Payer: Self-pay | Admitting: Emergency Medicine

## 2014-12-29 IMAGING — CT CT STONE STUDY
2 of 4 series · 15 of 46 positions shown, 17 images · non-contrast
Comparison: CT abdomen pelvis -01/23/2014

CLINICAL DATA: Right mid to lower abdominal pain and nausea since
last evening, history of cholecystectomy 2 months ago

EXAM:
CT ABDOMEN AND PELVIS WITHOUT CONTRAST
TECHNIQUE: Multidetector CT imaging of the abdomen and pelvis was performed
following the standard protocol without IV contrast.

[Series 2: stone standard full · axial · 0.79mm/px · z∈[+267,+702]mm · 12 of 99 slices shown, 14 images]
[im 8/99  soft-tissue]
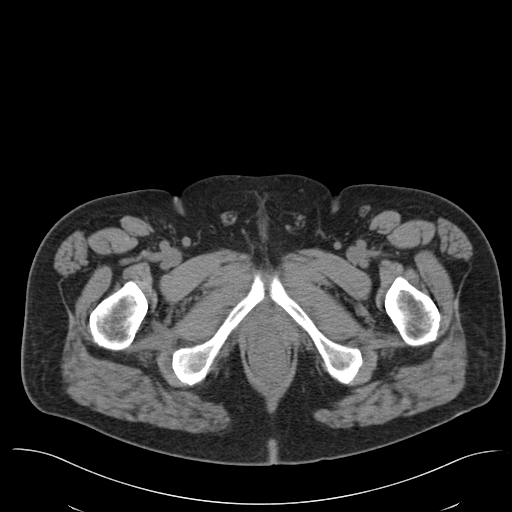
[im 8/99  bone]
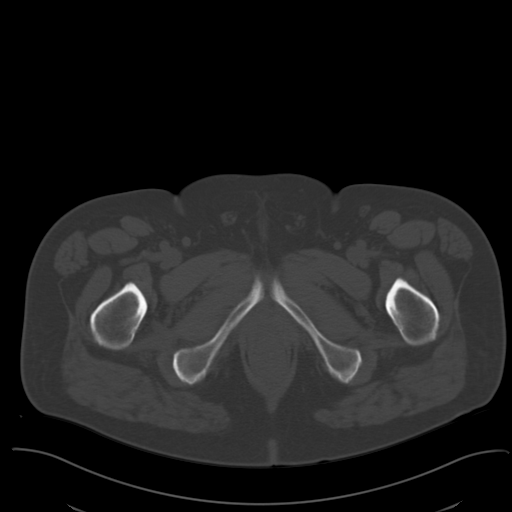
[im 16/99  soft-tissue]
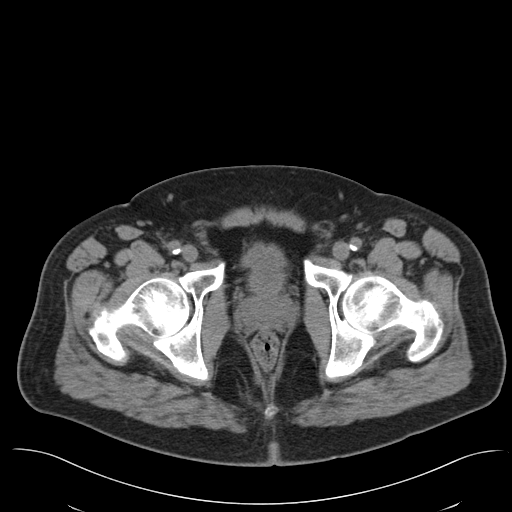
[im 24/99  soft-tissue]
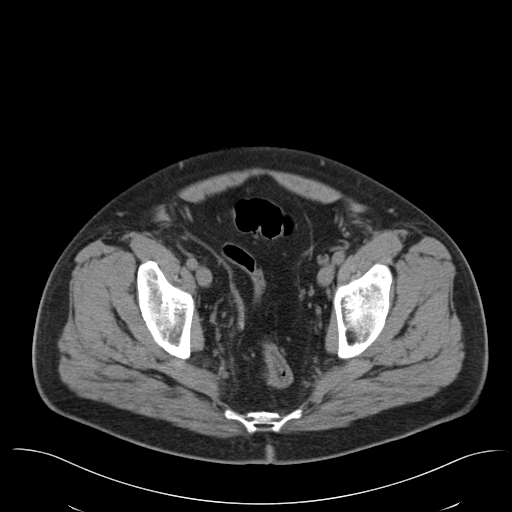
[im 32/99  soft-tissue]
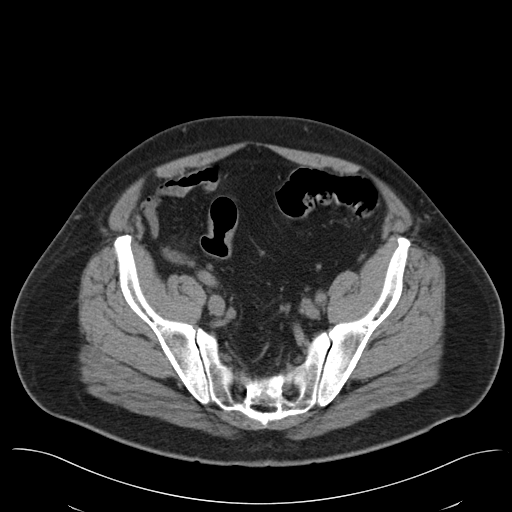
[im 40/99  soft-tissue]
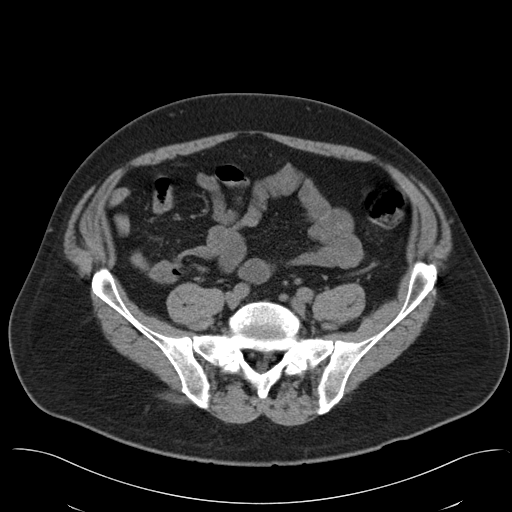
[im 48/99  soft-tissue]
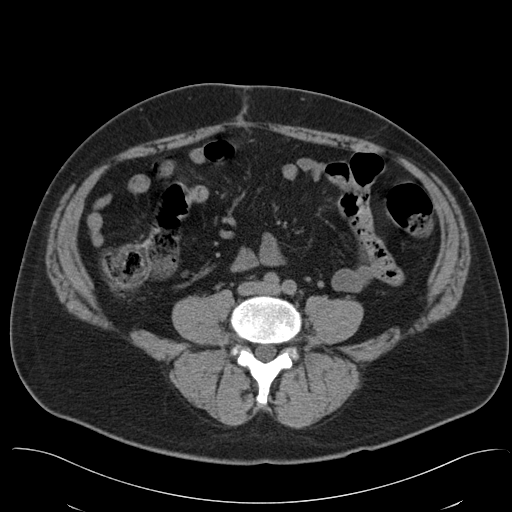
[im 55/99  soft-tissue]
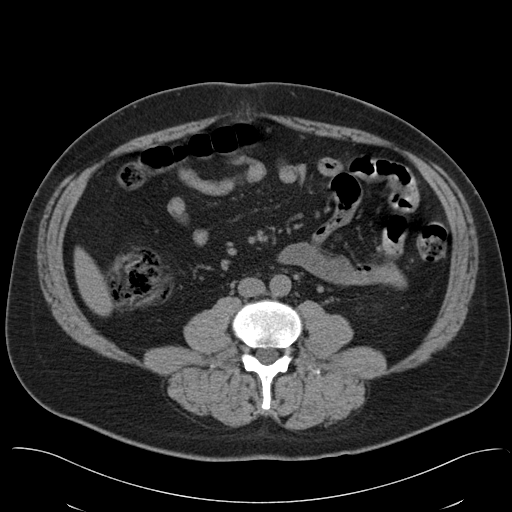
[im 63/99  soft-tissue]
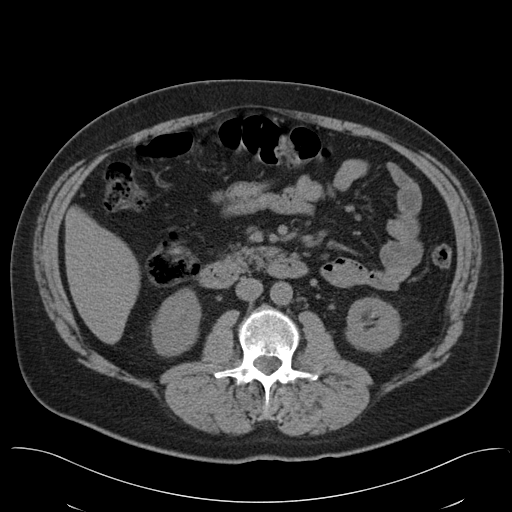
[im 71/99  soft-tissue]
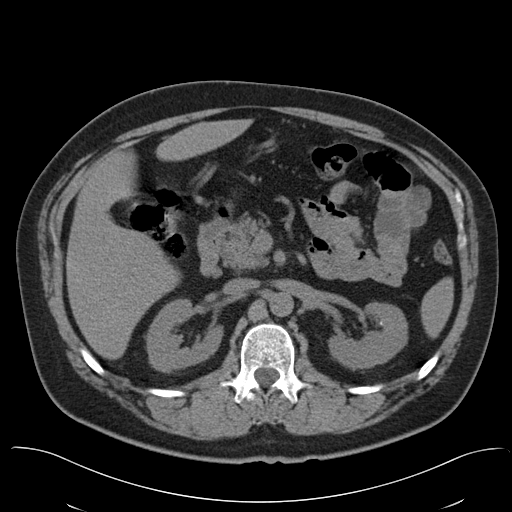
[im 71/99  bone]
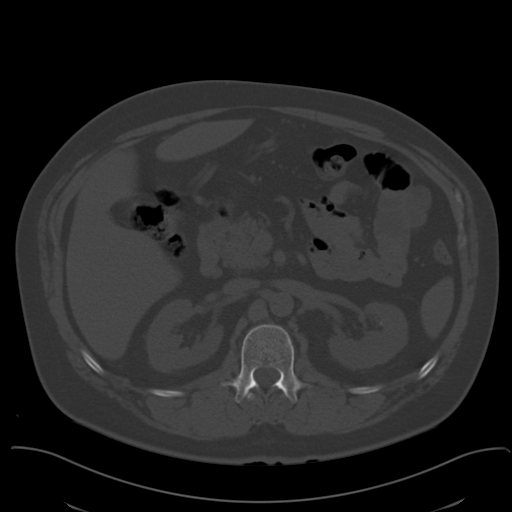
[im 79/99  soft-tissue]
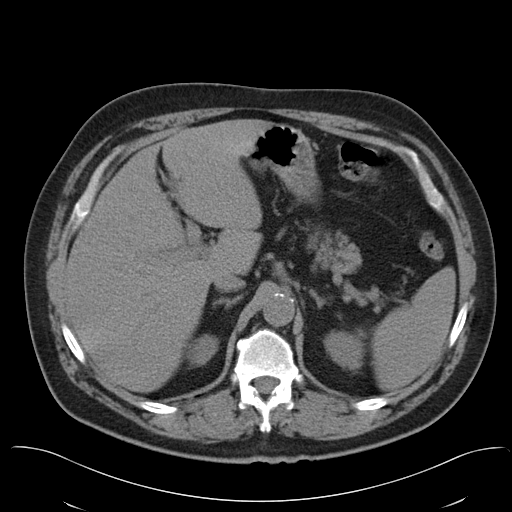
[im 87/99  soft-tissue]
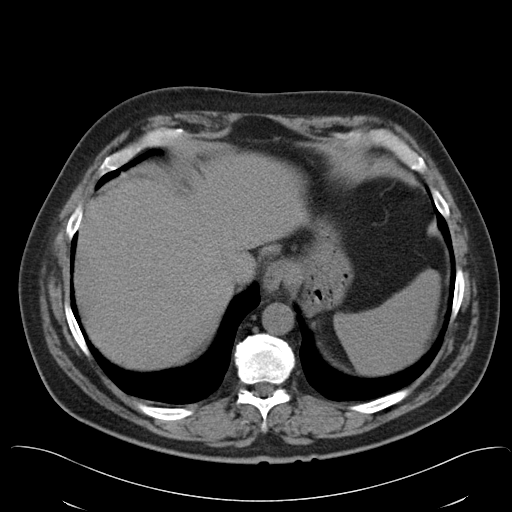
[im 95/99  soft-tissue]
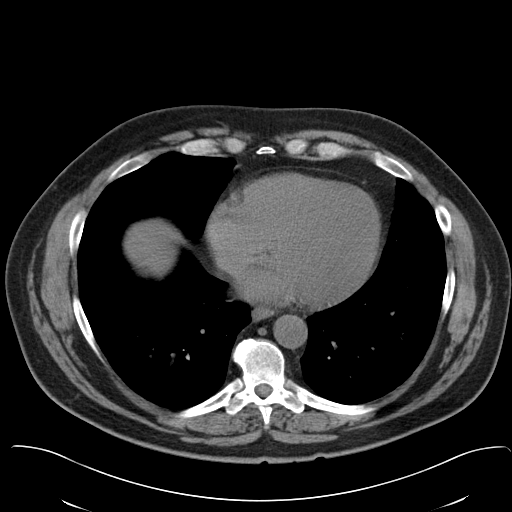

[Series 5: cor stone standard full · coronal · 0.81mm/px · 3 of 159 slices shown]
[im 53/159  soft-tissue]
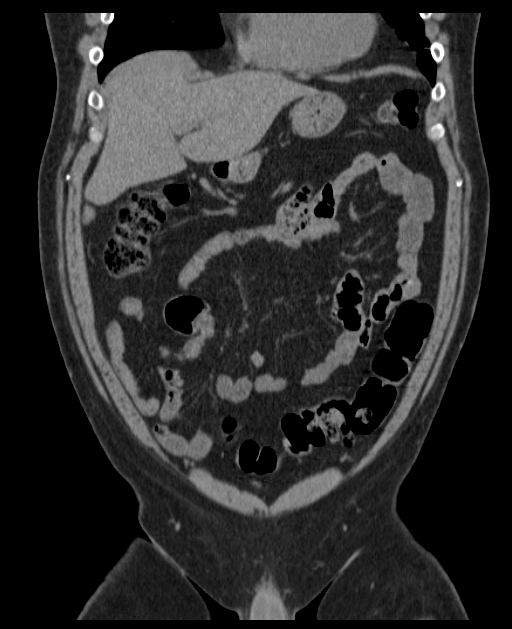
[im 71/159  soft-tissue]
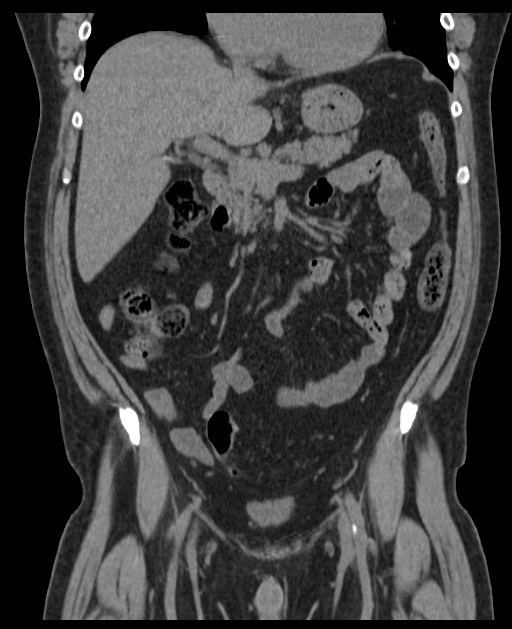
[im 88/159  soft-tissue]
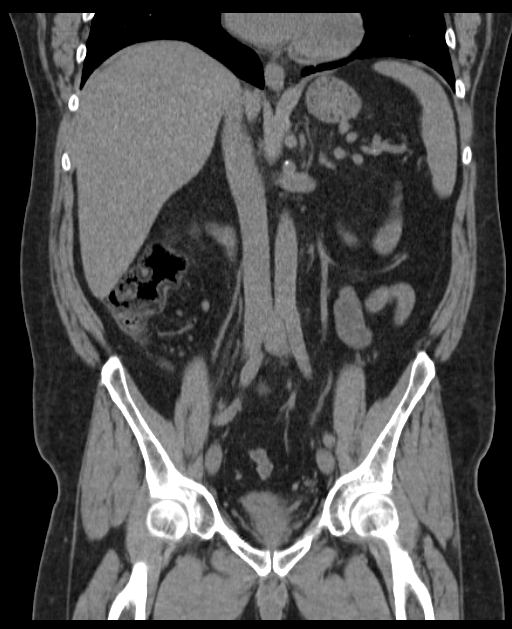

[15 of 46 positions shown; findings below may reference images not displayed]

FINDINGS: The lack of intravenous contrast limits the ability to evaluate
solid abdominal organs.

There are 4 punctate nonobstructing stones within the left kidney,
the largest of which within the superior pole the left kidney
measures approximately 0.9 x 0.5 cm (image 26, series 2). Additional
nonobstructing stones are seen on images 29 and 36. No right-sided
renal stones. No stones are seen along the expected course of either
ureter or the urinary bladder. Normal noncontrast appearance of the
urinary bladder given a graded distention. Scattered calcifications
within a normal sized prostate. No free fluid in the pelvis.

------------------------------------------------------

Normal hepatic contour. Post cholecystectomy. There is no residual
stranding within the gallbladder fossa. No ascites.

Normal noncontrast appearance of the bilateral adrenal glands,
pancreas and spleen.

Moderate colonic stool burden without evidence of obstruction.
Normal appearance of the appendix. No pneumoperitoneum, pneumatosis
or portal venous gas.

Scattered minimal atherosclerotic plaque within a normal caliber
abdominal aorta. No bulky retroperitoneal, porta hepatis,
mesenteric, pelvic or inguinal lymphadenopathy on this noncontrast
examination.

Limited visualization of lower thorax demonstrates minimal
subpleural ground-glass atelectasis in the bilateral lower lobes.
There is minimal so some atelectasis in the inferior segment of the
lingula. No pleural effusion. Normal heart size. No pericardial
effusion.

No acute or aggressive osseous abnormalities.

Small mesenteric fat containing periumbilical hernia. Minimal amount
of subcutaneous stranding within the left mid abdomen (image 46,
series 2 as well as the right lower abdominal quadrant (image 55,
likely the sequela of prior laparoscopy ports.
IMPRESSION: 1. No explanation for patient's mid to lower abdominal pain and
nausea. Specifically, no evidence of enteric or urinary obstruction.
2. No evidence of complication post interval cholecystectomy.
3. Nonobstructing left-sided nephrolithiasis, the largest of which
measures approximately 0.9 x 0.5 cm.

## 2015-01-13 ENCOUNTER — Emergency Department: Payer: Self-pay | Admitting: Emergency Medicine

## 2015-02-15 ENCOUNTER — Emergency Department
Admit: 2015-02-15 | Disposition: A | Discharge status: Home / Self Care | Payer: Self-pay | Admitting: Emergency Medicine

## 2015-02-28 ENCOUNTER — Emergency Department: Admit: 2015-02-28 | Discharge status: Against Medical Advice | Payer: Self-pay | Admitting: Emergency Medicine

## 2015-03-01 ENCOUNTER — Other Ambulatory Visit: Payer: Self-pay | Admitting: Urology

## 2015-03-01 DIAGNOSIS — R911 Solitary pulmonary nodule: Secondary | ICD-10-CM

## 2015-03-05 NOTE — H&P (Signed)
Subjective/Chief Complaint rt side pain    History of Present Illness 3 days pain, gradual but worsened today at wor. points to rt side upper and lower quad nausea no emesis nml BM in am, passing gas no prior episode, different from prior kidney stones    Past History PMH HTN, nephrolith PSH none    Past Medical Health Hypertension   Past Med/Surgical Hx:  gout:   kidney stones:   hypertension:   Denies medical history:   ALLERGIES:  No Known Allergies:   HOME MEDICATIONS: Medication Instructions Status  Ventolin HFA 90 mcg/inh inhalation aerosol 2  inhaled 4 times a day Active  Vitamin C 1 tab(s) orally once a day Active  lisinopril 20 mg oral tablet 1 tab(s) orally once a day Active  PriLOSEC OTC 20 mg oral delayed release tablet 1 tab(s) orally once a day Active   Family and Social History:   Family History Non-Contributory    Social History negative tobacco, negative ETOH, was a heavy drinker years ago. none now. works at [employer]  alone   Review of Systems:   Fever/Chills No    Cough No    Abdominal Pain Yes    Diarrhea No    Constipation No    Nausea/Vomiting Yes    SOB/DOE No    Chest Pain No    Dysuria No    Tolerating Diet No  Nauseated   Physical Exam:   GEN well developed, well nourished, disheveled, uncomfortable    HEENT pink conjunctivae    NECK supple    RESP normal resp effort  clear BS  no use of accessory muscles    CARD regular rate    ABD positive tenderness  soft  min diffuse tenderness, max in RLQ without guarding or rebound. neg Rosving's sign. able to push deeply into rt abd without much tenderness    LYMPH negative neck    EXTR negative edema    SKIN normal to palpation    PSYCH alert, A+O to time, place, person, good insight   Lab Results: Hepatic:  06-Dec-13 16:19    Bilirubin, Total 0.4   Alkaline Phosphatase 109   SGPT (ALT) 31   SGOT (AST) 20   Total Protein, Serum 7.6    Albumin, Serum 4.3  Routine Chem:  06-Dec-13 16:19    Glucose, Serum  110   BUN 16   Creatinine (comp) 1.08   Sodium, Serum 139   Potassium, Serum 4.3   Chloride, Serum 107   CO2, Serum 24   Calcium (Total), Serum 9.0   Osmolality (calc) 279   eGFR (African American) >60   eGFR (Non-African American) >60 (eGFR values <64m/min/1.73 m2 may be an indication of chronic kidney disease (CKD). Calculated eGFR is useful in patients with stable renal function. The eGFR calculation will not be reliable in acutely ill patients when serum creatinine is changing rapidly. It is not useful in  patients on dialysis. The eGFR calculation may not be applicable to patients at the low and high extremes of body sizes, pregnant women, and vegetarians.)   Anion Gap 8   Lipase 139 (Result(s) reported on 21 Oct 2012 at 05:23PM.)  Routine UA:  06-Dec-13 16:19    Color (UA) Yellow   Clarity (UA) Clear   Glucose (UA) Negative   Bilirubin (UA) Negative   Ketones (UA) Negative   Specific Gravity (UA) 1.019   Blood (UA) Negative  pH (UA) 5.0   Protein (UA) Negative   Nitrite (UA) Negative   Leukocyte Esterase (UA) 1+ (Result(s) reported on 21 Oct 2012 at 04:59PM.)   RBC (UA) 1 /HPF   WBC (UA) 6 /HPF   Bacteria (UA) NONE SEEN   Epithelial Cells (UA) <1 /HPF (Result(s) reported on 21 Oct 2012 at 04:59PM.)  Routine Hem:  06-Dec-13 16:19    WBC (CBC) 8.1   RBC (CBC) 4.74   Hemoglobin (CBC) 14.7   Hematocrit (CBC) 43.0   Platelet Count (CBC) 213 (Result(s) reported on 21 Oct 2012 at 05:33PM.)   MCV 91   MCH 30.9   MCHC 34.1   RDW 13.5   Radiology Results: CT:    06-Dec-13 18:49, CT Abdomen Pelvis WO for Stone   CT Abdomen Pelvis WO for Stone   REASON FOR EXAM:    right flank, RUQ, RLQ pain, h/o kidney stones  COMMENTS:       PROCEDURE: CT  - CT ABDOMEN /PELVIS WO (STONE)  - Oct 21 2012  6:49PM     RESULT: Comparison: None    Technique: Multiple axial images from the lung bases to the  symphysis   pubis were obtained without oral and without intravenous contrast.    Findings:  Mild opacities in the lingula likely represent atelectasis.    Lack of intravenous contrast limits evaluation of the solid abdominal   organs.  Grossly, the liver,gallbladder, spleen, adrenals, and pancreas     are unremarkable. 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    There multiple bilateral renal calculi. The largest is seen in the left   kidney, measuring 6 mm. There is no hydronephrosis. No ureterectasis.    There are a few loops of fluid-filled small bowel in the left hemiabdomen   which are at the upper limits normal in size. There does not appear to be   a definite transition point. There is diverticulosis of the sigmoid   colon. The appendix is normal. There is a small fat-containing   periumbilical hernia.    No aggressive lytic or sclerotic osseous lesions are identified.    IMPRESSION:   1. Bilateral nephrolithiasis, without hydronephrosis.  2. There are a few loops of fluid-filled small bowel in the left   hemiabdomen which are at the upper limits of normal in size. This is   nonspecific and may related to mild ileus. Early or partial small bowel   obstruction is not excluded.  3. 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   However, if there is continued clinical concern, further traversing could   be provided endoscopy.        Verified By: Gregor Hams, M.D., MD     Assessment/Admission Diagnosis abd pain, nml labs and CT max in rlq will admit hydrate and observe laparoscopy if worsens   Electronic Signatures: Florene Glen (MD)  (Signed 06-Dec-13 20:24)  Authored: CHIEF COMPLAINT and HISTORY, PAST MEDICAL/SURGIAL HISTORY, ALLERGIES, HOME MEDICATIONS, FAMILY AND SOCIAL HISTORY, REVIEW OF  SYSTEMS, PHYSICAL EXAM, LABS, Radiology, ASSESSMENT AND PLAN   Last Updated: 06-Dec-13 20:24 by Florene Glen (MD)

## 2015-03-05 NOTE — Discharge Summary (Signed)
PATIENT NAME:  Miguel [name], WHOLEY MR#:  [id number] DATE OF BIRTH:  January 02, 1961  DATE OF ADMISSION:  10/21/2012 DATE OF DISCHARGE:  10/23/2012  DIAGNOSES: 1. Abdominal pain. 2. Kidney stones.  3. Gout.   PROCEDURES: None.   HISTORY OF PRESENT ILLNESS/HOSPITAL COURSE: This is a patient who was admitted to the hospital with several days of abdominal pain mostly on the right side. He had a noncontrasted CT scan in the Emergency Room which was reviewed personally showing no signs of acute appendicitis and no other abnormal pathology with the exception of calyceal nephrolithiasis on both sides without signs of ureteral obstruction.   He was admitted to the hospital and hydrated. He had a few emesis but was having stools and passing gas. He was advanced to clear liquids and felt better. A repeat white blood cell count remained normal throughout his hospitalization. A repeat CT scan this time done with contrast failed to identify any other abnormalities.   The patient was discharged in stable condition to follow-up with his primary care physician within the week and to follow-up with [name] as needed.   ____________________________ Jerrol Banana Burt Knack, MD rec:drc D: 10/23/2012 19:07:24 ET T: 10/24/2012 08:24:57 ET JOB#: [id number]  cc: Jerrol Banana. Burt Knack, MD, <Dictator> Florene Glen MD ELECTRONICALLY SIGNED 10/25/2012 8:39

## 2015-03-05 NOTE — H&P (Signed)
PATIENT NAME:  Miguel [name], CANTAVE MR#:  [id number] DATE OF BIRTH:  January 11, 1961  DATE OF ADMISSION:  10/21/2012  DATE OF BIRTH: 07/13/1986  CHIEF COMPLAINT: Right-sided abdominal pain.   HISTORY OF PRESENT ILLNESS: This is a patient with abdominal pain started on Tuesday, was very gradually and it is worsening until today. He was at work he thought that it hurt considerably more. He had been to his primary care doctor earlier in the week who was concerned about  kidney stones versus appendicitis and asked them to come to the ER if he were to worsen which he did today. He states that the pain worsened this afternoon about 1:00. He has had nausea but no vomiting. Denies fevers or chills. Has never had an episode like this before stating that it is not similar at all to prior kidney stone attacks.   He denies melena or hematochezia, had a normal bowel movement today. No vomiting but is nauseated at this point.   Denies fevers or chills.   PAST MEDICAL HISTORY: Remote history of gout. He is not currently medicated for it. He has hypertension. He has never had surgery of any kind.   ALLERGIES: None.   MEDICATIONS: Lisinopril.   FAMILY HISTORY: Noncontributory.   SOCIAL HISTORY: The patient works at [employer]. He does not smoke or drink, although he states he was a heavy drinker at one point many years ago.   REVIEW OF SYSTEMS: A 10 system review was normal with the exception of that mentioned in the history of present illness.   PHYSICAL EXAMINATION:  GENERAL: A healthy male patient, BMI of 29.   VITAL SIGNS: Temperature 98.9. Pulse is 78, respirations 21, blood pressure 160/90, 98% room air sat. Pain scale of 10.   GENERAL:  The patient looks very uncomfortable, he is sitting of the end of the bed. He prefers to sit up stating that it worsens when he lays down, does not hurt when he moves.   HEENT: No scleral icterus.   NECK: No palpable neck nodes.   CHEST: Clear to auscultation.    CARDIAC: Regular rate and rhythm.   ABDOMEN: Soft, nondistended, nontympanitic. There are no scars noted. He is minimally tender diffusely, maximally in the right lower quadrant although I can push deeply into his right iliac fossa without any worsening of his pain or tenderness and no clearcut signs of  severe tenderness on deep palpation, certainly no guarding, rebound or percussion tenderness.   EXTREMITIES: Without edema. Calves are nontender.   NEUROLOGIC: Grossly intact.   INTEGUMENT: No jaundice.   LABORATORY, DIAGNOSTIC AND RADIOLOGICAL DATA: Laboratory values are personally reviewed as his CT scan. His electrolytes are within normal limits. White blood cell count is 8.1, hemoglobin and hematocrit 14.7 and 43. Urinalysis shows minimal leukocyte esterase but no blood. CT scan is personally reviewed. There is some dilated small bowel loops but no clear-cut signs of a small bowel obstruction (the patient has had no surgery to suggest adhesive disease).   ASSESSMENT AND PLAN: This is a patient with unclear etiology to abdominal pain. He points to the right lower quadrant and right upper quadrant as point of maximal tenderness  although on exam he is only very minimally tender without any peritoneal signs and a normal white blood cell count. CT scan does not suggest appendicitis. My recommendations are to admit this patient to the hospital because of his uncontrolled pain and nausea. He did gag but did not vomit while being interviewed  I do not believe that he needs a nasogastric tube based on the findings on CT scan or clinically. This patient will be re-examined. White blood cell count will be rechecked. If he were worsen at any point to suggest that this were appendicitis or something surgical, a diagnostic laparoscopy could be performed. This was discussed with the patient understood and agreed to proceed.   ____________________________ Jerrol Banana Burt Knack, MD rec:ljs D: 10/21/2012  20:31:53 ET T: 10/22/2012 07:58:02 ET JOB#: [id number]  cc: Jerrol Banana. Burt Knack, MD, <Dictator> Florene Glen MD ELECTRONICALLY SIGNED 10/22/2012 19:24

## 2015-03-08 IMAGING — CR DG CHEST 1V PORT
1 series · 1 of 1 positions shown · non-contrast
Comparison: Chest radiograph performed 03/30/2014

CLINICAL DATA: Shortness of breath.

EXAM:
PORTABLE CHEST - 1 VIEW

[ap]
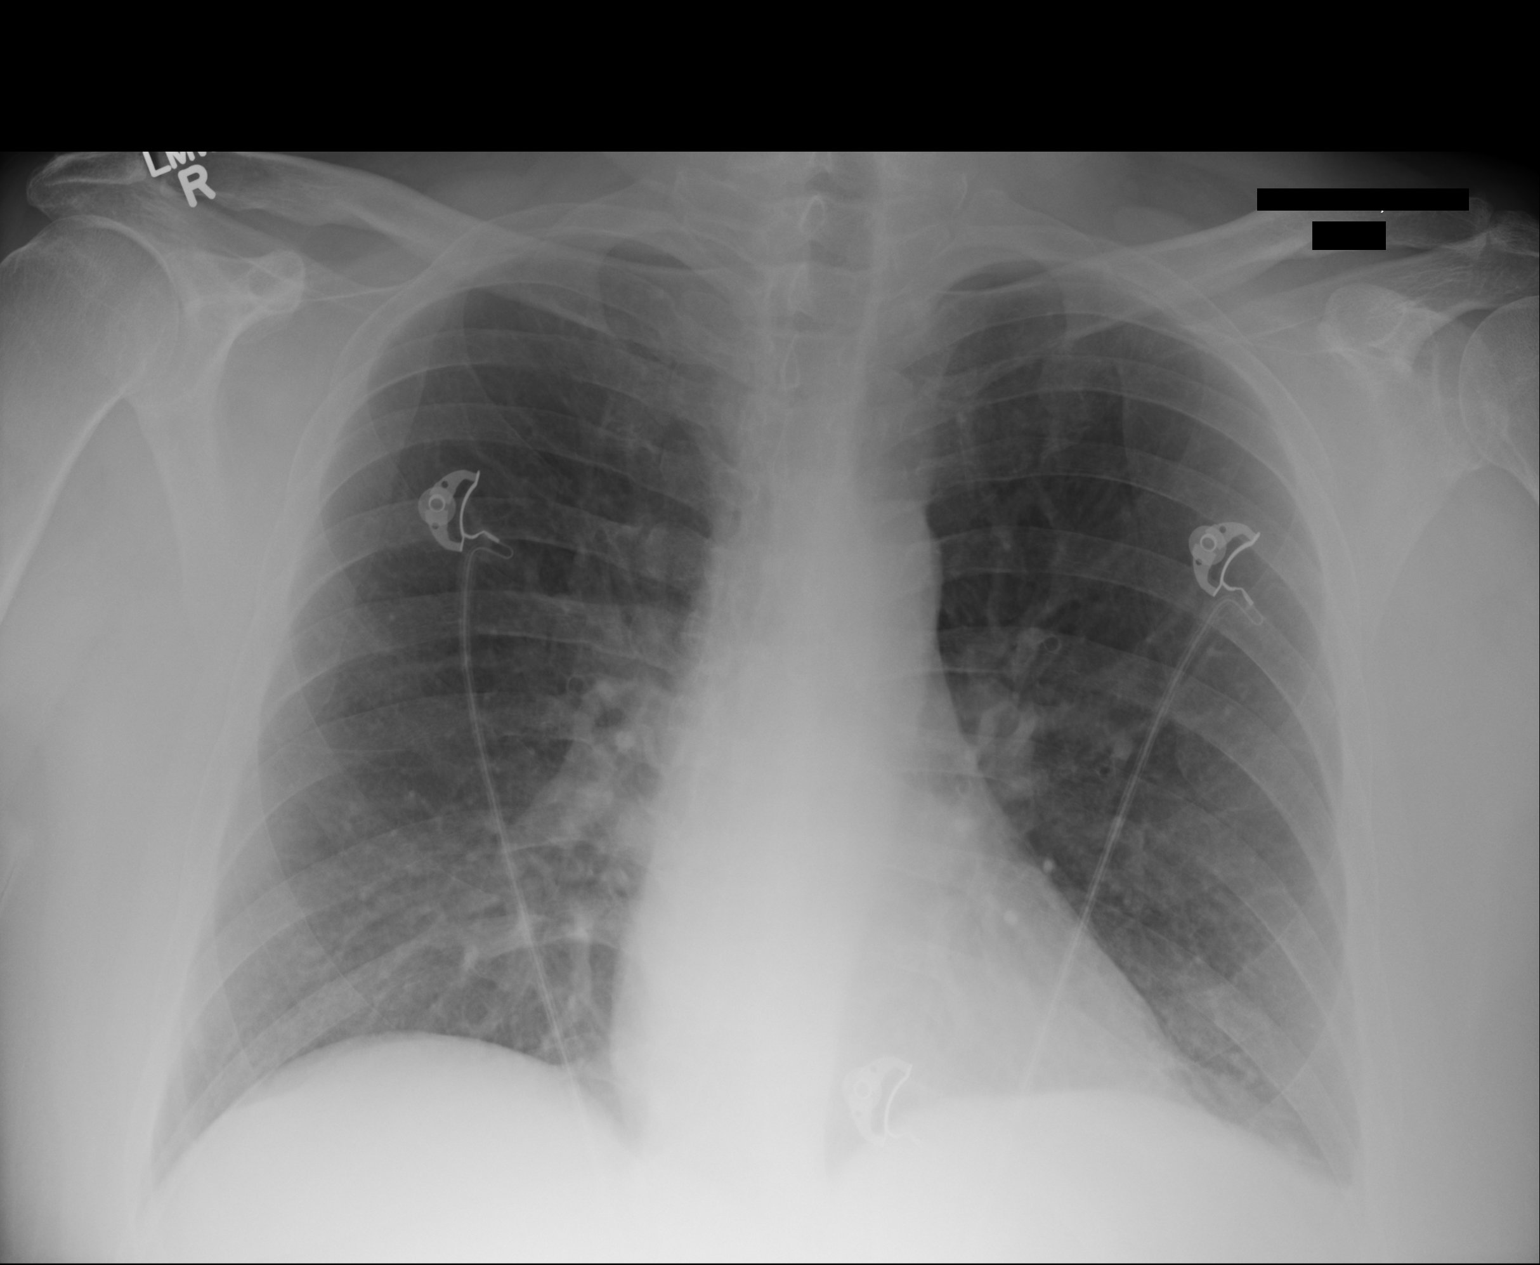

[1 of 1 positions shown; findings below may reference images not displayed]

FINDINGS: The lungs are well-aerated. Minimal left basilar opacity likely
reflects atelectasis. Mild vascular congestion is noted. There is no
evidence of focal opacification, pleural effusion or pneumothorax.

The cardiomediastinal silhouette is within normal limits. No acute
osseous abnormalities are seen.
IMPRESSION: Mild vascular congestion noted. Minimal left basilar opacity likely
reflects atelectasis. Lungs otherwise clear.

## 2015-03-09 IMAGING — CT CT ANGIO CHEST
2 of 6 series · 18 of 36 positions shown · IV contrast (isovue)
Comparison: Radiographs 06/07/2014 and 03/30/2014. Abdomen CT
03/30/2014.

CLINICAL DATA: Shortness of breath.  Question pulmonary embolism.

EXAM:
CT ANGIOGRAPHY CHEST WITH CONTRAST
TECHNIQUE: Multidetector CT imaging of the chest was performed using the
standard protocol during bolus administration of intravenous
contrast. Multiplanar CT image reconstructions and MIPs were
obtained to evaluate the vascular anatomy.
CONTRAST:  100 ml Isovue 370.

[Series 5: pe 1.0 thins · axial · 0.68mm/px · z∈[-350,-103]mm · 17 of 279 slices shown]
[im 16/279  lung]
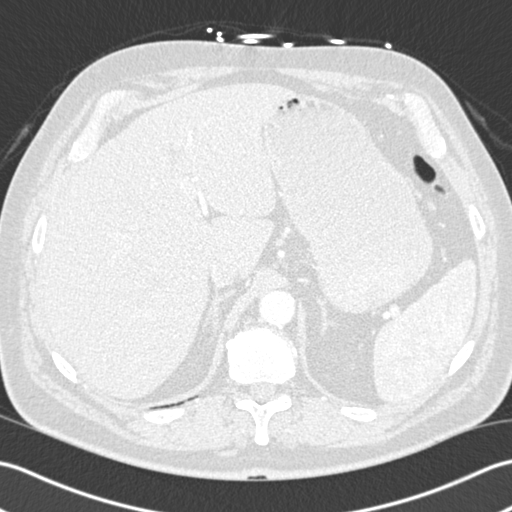
[im 31/279  mediastinal]
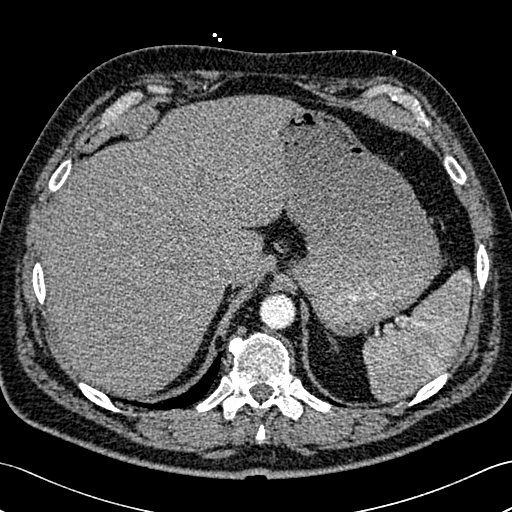
[im 47/279  lung]
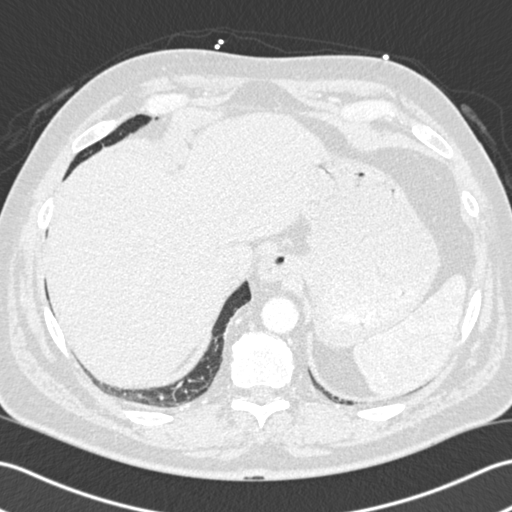
[im 62/279  mediastinal]
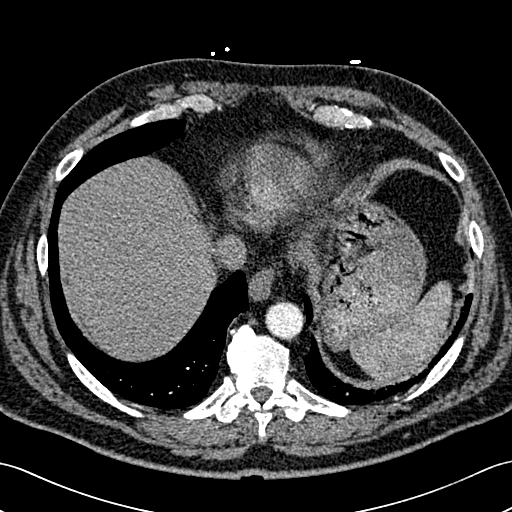
[im 78/279  lung]
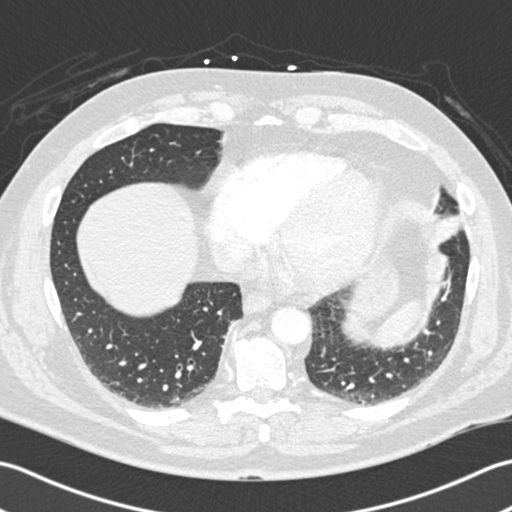
[im 93/279  mediastinal]
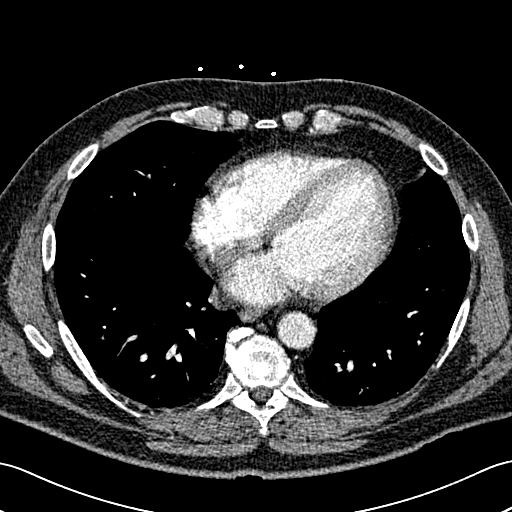
[im 109/279  lung]
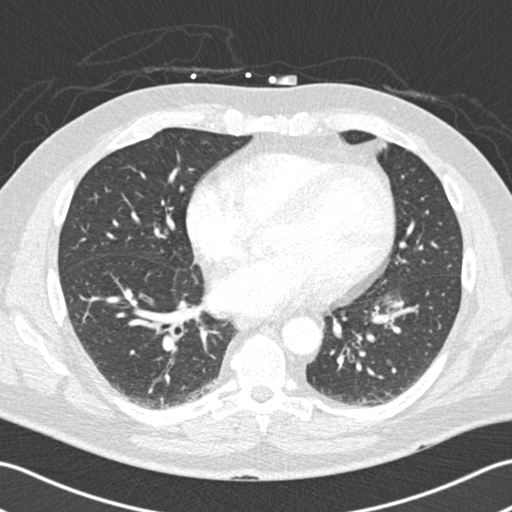
[im 124/279  mediastinal]
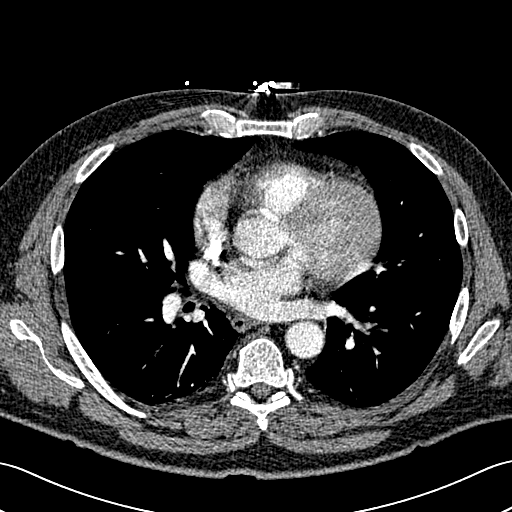
[im 140/279  lung]
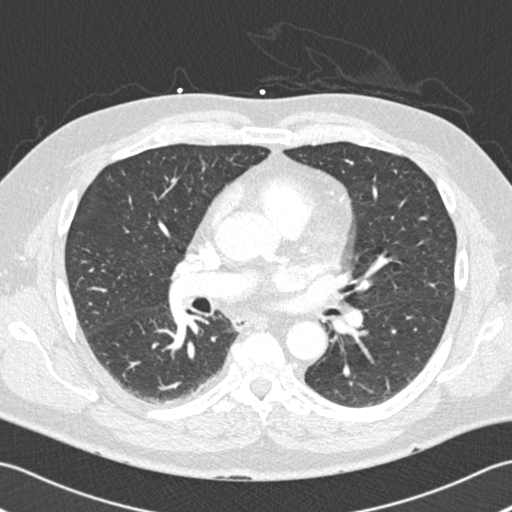
[im 155/279  mediastinal]
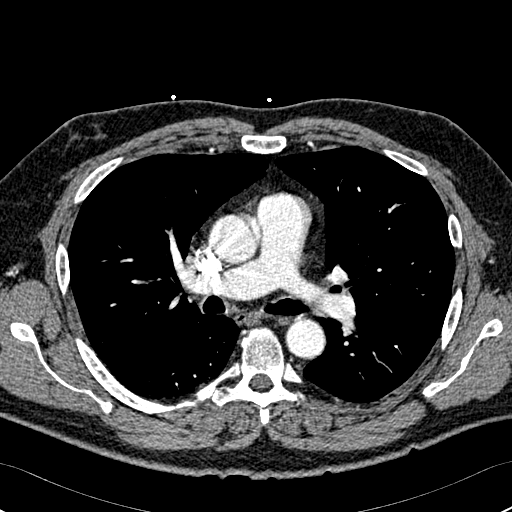
[im 170/279  lung]
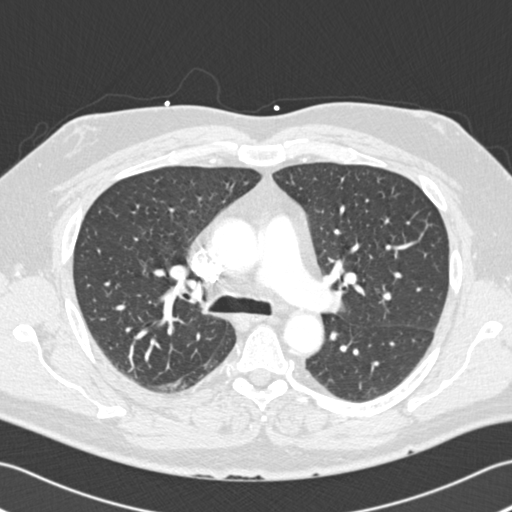
[im 186/279  mediastinal]
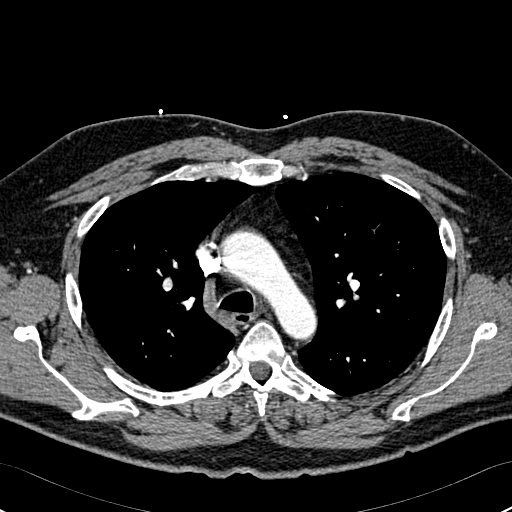
[im 201/279  lung]
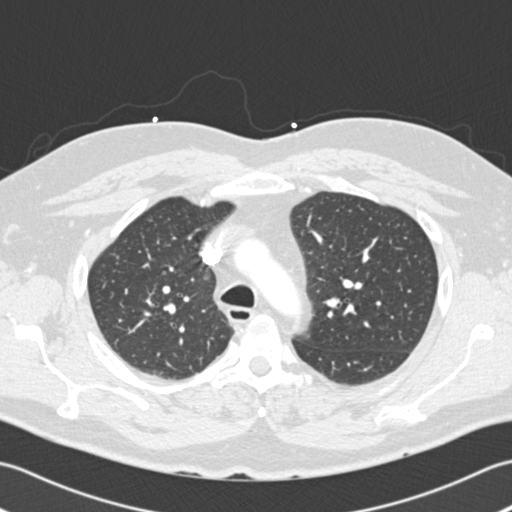
[im 217/279  mediastinal]
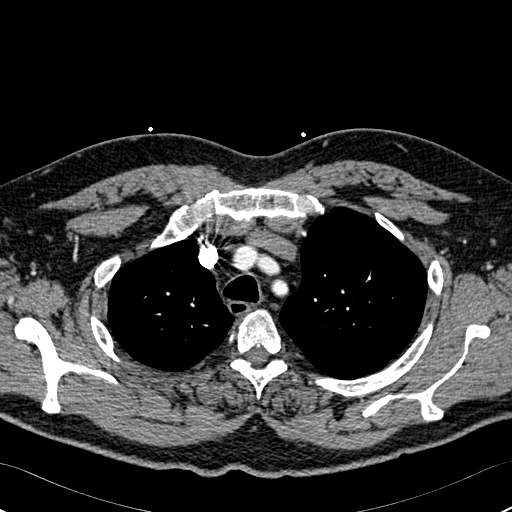
[im 232/279  lung]
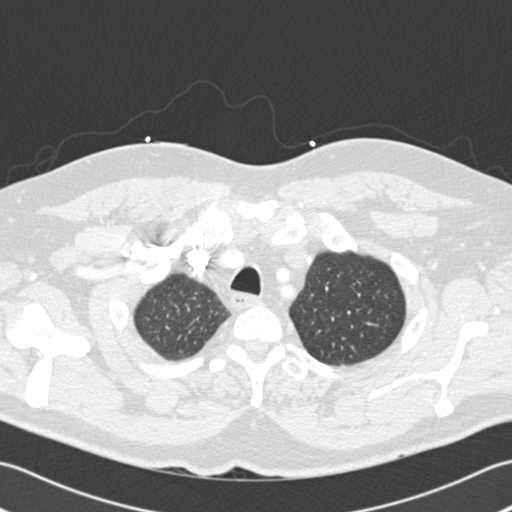
[im 248/279  mediastinal]
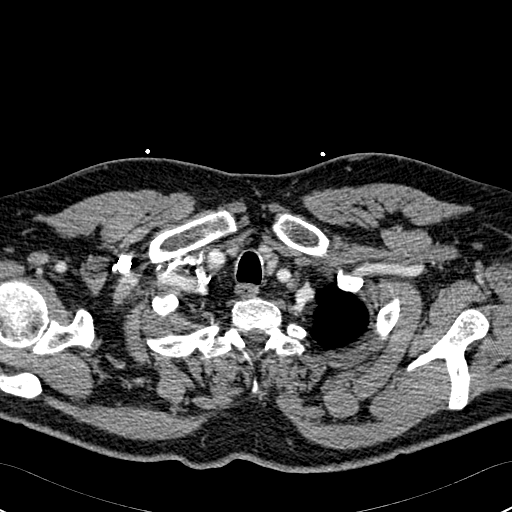
[im 263/279  lung]
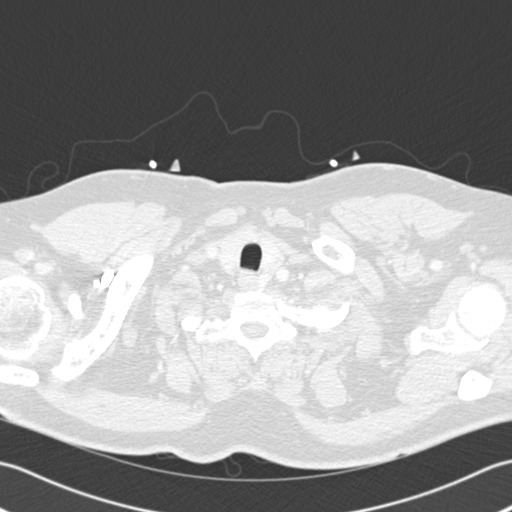

[Series 7: cor pe 2.0 mpr · coronal · 0.59mm/px · 1 of 117 slices shown]
[im 59/117  mediastinal]
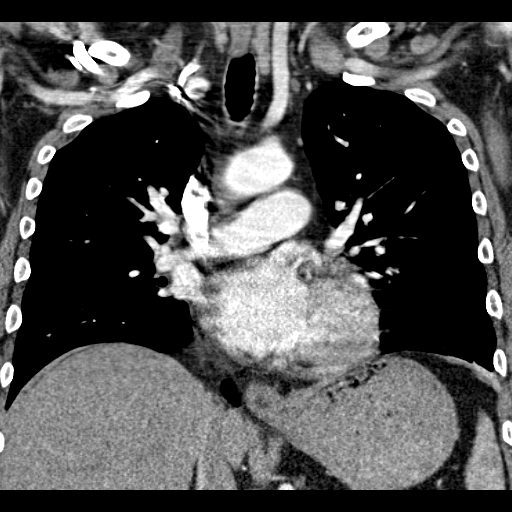

[18 of 36 positions shown; findings below may reference images not displayed]

FINDINGS: Vascular: The pulmonary arteries are well opacified with contrast.
There is no evidence of acute pulmonary embolism. There is no
significant atherosclerosis.

Mediastinum: There are no enlarged mediastinal, hilar or axillary
lymph nodes. The thyroid gland, trachea and esophagus appear normal.
The heart size is normal.

Lungs/Pleura: There is no pleural or pericardial effusion.There is
mild dependent atelectasis or scarring in both lung bases. Mild
thickening of the minor fissure anteriorly (image [DATE] be
secondary to lymphoid tissue. 3 mm upper lobe nodules are noted
bilaterally on image number 26. No suspicious pulmonary findings.

Upper abdomen: Unremarkable.  There is no adrenal mass.

Musculoskeletal/Chest wall: No chest wall lesion or acute osseous
findings.

Review of the MIP images confirms the above findings.
IMPRESSION: 1. No evidence of acute pulmonary embolism or other acute chest
process.
2. Scatter pulmonary scarring and minimal nodularity, likely benign.
If the patient is at high risk for bronchogenic carcinoma, follow-up
chest CT at 1 year is recommended. If the patient is at low risk, no
follow-up is needed. This recommendation follows the consensus
statement: Guidelines for Management of Small Pulmonary Nodules
Detected on CT Scans: A Statement from the [HOSPITAL] as

## 2015-03-09 NOTE — H&P (Signed)
PATIENT NAME:  Miguel [name], Miguel [name] MR#:  [id number] DATE OF BIRTH:  09/02/1961  DATE OF ADMISSION:  06/08/2014  REFERRING PHYSICIAN:  Dr. Marjean Donna.   PRIMARY CARE PHYSICIAN:  [facility].   CHIEF COMPLAINT:  Shortness of breath, cough.   HISTORY OF PRESENT ILLNESS:  This is a 54 year old male with known history of asthma, hypertension, GERD, presents with complaints of shortness of breath, cough, productive sputum, yellow in color, the patient reports he has history of asthma, has recently started by his PCP on Advair, he reports over the last few days he is having progressive shortness of breath, wheezing, where he required to use his inhaler more often without much relief, reports he was recently in the Emergency Room where he was given prednisone tapering dose without much relief.  Reports currently is on day #3 which prompted him to come back to the ED, in the ED the patient has significant wheezing and was given IV Solu-Medrol and multiple nebulizer treatments, however despite that he felt shortness of breath and tachycardic, the patient because of his tachycardia had CT chest angiogram which was negative for PE.  Hospitalist service requested to admit the patient for further treatment of his asthma exacerbation, the patient reports he was never intubated because of asthma exacerbation.  As well, he was never hospitalized as an inpatient for asthma exacerbation in the past.   ALLERGIES:  No known drug allergies.   HOME MEDICATIONS: 1.  Lisinopril 20 mg oral daily.  2.  Nexium 40 mg oral daily.  3.  Ventolin as needed.  4.  Allopurinol 100 mg oral daily.  5.  Advair 1 puff twice daily.  The patient cannot recall the dose.   PAST MEDICAL HISTORY: 1.  Obesity. 2.  Cholelithiasis. 3.  Abdominal pain.  4.  Reflux.  5.  Hypertension.  6.  Gout.  7.  Anxiety.  8.  Asthma.   PAST SURGICAL HISTORY:  Cholecystectomy.   FAMILY HISTORY:  No family history of coronary artery disease at a  young age.  Family history is also significant for hypertension.   SOCIAL HISTORY:  The patient does not smoke.  He quit smoking more than 15 years ago.  Occasional alcohol use.  No illicit drug use.  Lives at home with his wife.   CODE STATUS:  FULL CODE.   REVIEW OF SYSTEMS: CONSTITUTIONAL:  The patient denies fever, chills, fatigue, weakness, weight gain, weight loss.  EYES:  Denies blurry vision, double vision, inflammation.  EARS, NOSE, THROAT:  Denies tinnitus, ear pain, hearing loss, epistaxis.  RESPIRATORY:  Reports cough, productive sputum, yellow in color.  Reports wheezing and shortness of breath.  Denies hemoptysis.  CARDIOVASCULAR:  No chest pain, orthopnea, edema, palpitations, syncope.  GASTROINTESTINAL:  Denies nausea, vomiting, diarrhea, abdominal pain.  GENITOURINARY:  Denies dysuria, hematuria, renal colic.  ENDOCRINE:  Denies polyuria, polydipsia, heat or cold intolerance.  HEMATOLOGY:  Denies anemia, easy bruising, bleeding diathesis.  INTEGUMENT:  Denies acne, rash or skin lesion.  MUSCULOSKELETAL:  Reports history of gout.  Denies arthritis, cramps.  NEUROLOGIC:  Denies history of CVA, TIA, focal deficits.  PSYCHIATRIC:  Denies anxiety, insomnia, or depression.   PHYSICAL EXAMINATION: VITAL SIGNS:  Temperature 98.1, pulse 109, respiratory rate 18, blood pressure 160/90, saturating 95% on room air.  GENERAL:  Well-nourished male, looks comfortable in bed, in no apparent distress.  HEENT:  Head atraumatic, normocephalic.  Pupils equal, reactive to light.  Pink conjunctivae.  Anicteric sclerae.  Moist oral mucosa.  NECK:  Supple.  No thyromegaly.  No JVD.  CHEST:  Good air entry bilaterally.  No wheezing, rales, rhonchi.  CARDIOVASCULAR:  S1, S2 heard.  No rubs, murmur or gallops.  ABDOMEN:  Soft, nontender, nondistended.  Bowel sounds present.  EXTREMITIES:  No edema.  No clubbing.  No cyanosis.  PSYCHIATRIC:  Appropriate affect.  Awake, alert x 3.  Intact judgment  and insight.  NEUROLOGIC:  Cranial nerves grossly intact.  Motor 5 out of 5.  No focal deficits.  MUSCULOSKELETAL:  No joint effusion or erythema.  SKIN:  Normal skin turgor.  Warm and dry.   PERTINENT LABORATORY DATA:  Glucose 222, BUN 12, creatinine 0.98, sodium 142, potassium 3.6, chloride 108, CO2 20.  Troponin less than 0.02.  White blood cells 7.4, hemoglobin 13.3, hematocrit 41.3, platelets 260.  CT chest angiogram negative for PE.   ASSESSMENT AND PLAN: 1.  Asthma exacerbation, the patient still presents with shortness of breath, wheezing, despite being on by mouth prednisone tapering dose, so he will be admitted for intravenous steroids, nebulizers around-the-clock, as needed albuterol, given the fact he is having productive sputum yellow in color we will have him on Rocephin and azithromycin.  2.  Hyperglycemia.  The patient does not carry a diagnosis of diabetes, I believe this is uncontrolled secondary to his steroid use.  We will monitor his fingersticks every 6 hours.  We will recheck hemoglobin A1c.  3.  Hypertension.  Blood pressure mildly elevated.  We will have him on lisinopril.  4.  History of gout.  Continue with allopurinol.  5.  Reflux.  Continue with proton pump inhibitor.  6.  Deep vein thrombosis prophylaxis.  SubQ heparin.  7.  CODE STATUS:  FULL CODE.   Total time spent on admission and patient care 50 minutes.     ____________________________ Albertine Patricia, MD dse:ea D: 06/08/2014 03:26:46 ET T: 06/08/2014 04:11:18 ET JOB#: [id number]  cc: Albertine Patricia, MD, <Dictator> Jaleigh Mccroskey Graciela Husbands MD ELECTRONICALLY SIGNED 06/08/2014 23:28

## 2015-03-09 NOTE — H&P (Signed)
PATIENT NAME:  Miguel [name], Miguel [name] MR#:  [id number] DATE OF BIRTH:  1961-04-16  DATE OF ADMISSION:  12/13/2013  PRIMARY CARE PHYSICIAN:  [facility].   HISTORY OF PRESENT ILLNESS: A 54 year old male patient with history of hypertension, gout, nephrolithiasis, presents to the Emergency Room complaining of pain in his left ankle, wrist and elbow. He has had similar attacks in the past with gout; last one being 2 months prior. This has been the worst attack he has had. The patient had an endoscopy and colonoscopy yesterday, was n.p.o. for that; noticed that his pain was starting. Normally, he takes colchicine, but during this episode he was told not to take any medications or NSAIDs after the endoscopy, which he did not take. As the pain was getting worse, he did take a dose of colchicine earlier, presented to the Emergency Room. He is unable to walk secondary to his ankle pain, wrist pain. Has received 2 doses of colchicine in the Emergency Room, total of 12 mg IV morphine, not on chronic narcotics and still has persistent pain, unable to walk, and is being admitted to the hospitalist service under observation. The patient has a handicapped wife, and he is unable to go home.   Surprisingly, in spite of multiple attacks, the patient is not aware of any foods that he should not eat. Also, he is not on any preventive medications. He mentions that he was supposed to get started on allopurinol, but they could not find any time between attacks to start the allopurinol. Has finished a course of prednisone 2 weeks prior for a gout attack.   PAST MEDICAL HISTORY: 1.  Hypertension.  2.  Gout.  3.  Nephrolithiasis.  4.  Gastritis diagnosed on EGD on 12/12/2013.  5.  Chronic back pain.  6.  Anxiety.  7.  Asthma.  FAMILY HISTORY: Hypertension. No gout.   SOCIAL HISTORY: The patient does not smoke. Occasional alcohol. No illicit drugs. Lives with his wife at home.   CODE STATUS: FULL CODE.   ALLERGIES: No known  drug allergies.   REVIEW OF SYSTEMS: CONSTITUTIONAL: Complains of some fatigue.  EYES: No blurred vision, pain, redness.  EARS, NOSE, THROAT: No tinnitus, ear pain, hearing loss.  RESPIRATORY: No cough, wheeze, hemoptysis,  CARDIOVASCULAR: No chest pain, orthopnea, edema.  GASTROINTESTINAL: No nausea, vomiting, diarrhea, abdominal pain.  GENITOURINARY: No dysuria, hematuria, frequency.  ENDOCRINE: No polyuria, nocturia, thyroid problems.    HEMATOLOGIC AND LYMPHATIC: No easy bruising or bleeding.  INTEGUMENTARY: No acne, rash, lesion.  MUSCULOSKELETAL: Has tenderness and warmth of the right elbow, wrist and ankle. Erythema around the ankle area.  NEUROLOGIC: No focal numbness, weakness, seizure.  PSYCHIATRIC: No anxiety or depression.   HOME MEDICATIONS: Include:  1.  Colchicine 0.6 mg oral as needed for gout attacks.  2.  Hydroxyzine 1 tablet oral once a 25 mg.  3.  Indomethacin 50 mg oral 3 times a day.  4.  Lisinopril 20 mg oral once a day.  5.  Prilosec 20 mg oral once a day.  6.  Ventolin HFA 2 puffs inhaled 4 times a day as needed.   PHYSICAL EXAMINATION:  VITAL SIGNS:  Temperature of 98.8, pulse of 105, respirations 20, blood pressure 154/97, saturating 100% on room air.  GENERAL: Obese, Caucasian male patient lying in bed in significant distress secondary to his pain.  PSYCHIATRIC: He is alert and oriented x 3, anxious.  HEENT: Atraumatic, normocephalic. Oral mucosa moist and pink. External ears and nose  normal. No pallor. No icterus. Pupils bilaterally equal and reactive to light.  NECK: Supple. No thyromegaly or palpable lymph nodes. Trachea midline. No carotid bruit or JVD.  CARDIOVASCULAR: S1, S2. Tachycardic without any murmurs. Peripheral pulses 2+. No edema.  RESPIRATORY: Normal work of breathing. Clear to auscultation on both sides.  GASTROINTESTINAL: Soft abdomen, nontender. Bowel sounds present. No hepatosplenomegaly palpable.  GENITOURINARY: No CVA tenderness or  bladder distention.  SKIN: Warm and dry. Mild erythema around the right ankle area and dorsum of foot.  MUSCULOSKELETAL: Tenderness and decreased range of motion in the right ankle, elbow and wrist.  NEUROLOGICAL: Motor strength 5/5 in upper and lower extremities. Sensation to fine touch intact all over.  LYMPHATIC: No cervical lymphadenopathy.   LABORATORY STUDIES: Show glucose of 125, BUN 12, creatinine 1.09, sodium 135, potassium 3.7. Uric acid 8. WBC 15, hemoglobin 13.2, platelets of 197.   Elbow, right, shows no evidence of acute fracture or dislocation. Small olecranon spur.   Right ankle, complete, shows no acute fracture. Degenerative changes. Mild soft tissue swelling.   ASSESSMENT AND PLAN: 1.  Acute gout attack in a patient with uric acid levels of 8, who has had recurrent attacks for the past few years. The patient is not on any preventive medications. Presently, we will start him on  prednisone 60 mg daily. Continue the colchicine daily. Also use Toradol IV for pain control, which will help with the anti-inflammatory effect. The patient will need to be started on allopurinol or probenecid as outpatient once this attack improves. The patient will be admitted under observation for pain control, as he is unable to walk. I did consider discharging the patient home with crutches, but the patient's wrist is also tender and he is unable to use that.  2. Systemic inflammatory response syndrome secondary to gout. The patient did have arthrocentesis of his right elbow for crystals. We will see if there is any infection in there; extremely unlikely with the polyarthritis and with history of gout and the same joints being attacked recurrently. We will check an ESR.  3.  Hypertension. Continue home medications.  4.  Deep venous thrombosis prophylaxis with Lovenox.   I have printed out the patient information regarding gout for the patient, as he was not aware of dietary compliance importance with  gout and the disease process.   Time spent today on this case was 40 minutes.     ____________________________ Leia Alf , MD srs:dmm D: 12/13/2013 19:04:33 ET T: 12/13/2013 19:32:31 ET JOB#: [id number]  cc: Alveta Heimlich R. , MD, <Dictator> [facility] Neita Carp MD ELECTRONICALLY SIGNED 01/04/2014 14:09

## 2015-03-09 NOTE — Op Note (Signed)
PATIENT NAME:  Miguel [name], Miguel [name] MR#:  [id number] DATE OF BIRTH:  02-14-1961  DATE OF PROCEDURE:  01/18/2014  PREOPERATIVE DIAGNOSIS: Chronic cholecystitis.   POSTOPERATIVE DIAGNOSIS: Chronic cholecystitis.   OPERATION PERFORMED: Robotic-assisted laparoscopic cholecystectomy.   SURGEON: Rodena Goldmann, MD   ANESTHESIA: General.   OPERATIVE PROCEDURE: With the patient in the supine position after induction of appropriate general anesthesia, the patient's abdomen was prepped with ChloraPrep and draped with sterile towels. The patient was placed in the head down, feet up position. A small infraumbilical incision was made in the standard fashion, carried down bluntly through the subcutaneous tissue. A Veress needle was used to cannulate the peritoneal cavity. CO2 was insufflated to the appropriate pressure measurements. When approximately 2.5 liters of CO2 were instilled, the Veress needle was withdrawn. An 8.5 mm robotic port was inserted into the peritoneal cavity. Intraperitoneal position was confirmed. CO2 was reinsufflated. Two lateral ports, 8.5 mm in size, were inserted under direct vision. A 5 mm right-sided assistant port was inserted under direct vision. The robot was brought to the table, docked, instruments inserted under direct vision. I then moved to the console. The gallbladder was retracted superiorly and laterally by the assistant and dissection carried along the hepatoduodenal ligament. The cystic artery and cystic duct were identified. The cystic artery was clipped first. The duct was tediously dissected because of its position, but there did not appear to be any evidence of common duct obstruction or common duct injury. The cystic duct was elevated away from the liver, doubly clipped and divided. The gallbladder was then dissected free from its bed and delivered using hook and cautery apparatus. Once the gallbladder was free, it was left above the liver while the robot was undocked. The area was  then copiously suction irrigated. The gallbladder was brought out through the umbilical port using a regular grasping instrument. The abdomen was desufflated. All ports were withdrawn under direct vision. Skin incisions were closed with 5-0 nylon. The area was infiltrated with 0.25% Marcaine for postoperative pain control. Sterile dressings were applied. The patient returned to the recovery room, having tolerated the procedure well. Sponge and needle counts were correct x 2 in the operating room.      ____________________________ Rodena Goldmann III, MD rle:dmm D: 01/18/2014 12:53:32 ET T: 01/18/2014 13:29:10 ET JOB#: [id number]  cc: Micheline Maze, MD, <Dictator> Jerrell Belfast, MD Rodena Goldmann MD ELECTRONICALLY SIGNED 01/19/2014 12:29

## 2015-03-09 NOTE — Discharge Summary (Signed)
PATIENT NAME:  Miguel [name], Miguel [name] MR#:  [id number] DATE OF BIRTH:  1961-03-31  DATE OF ADMISSION:  06/08/2014 DATE OF DISCHARGE:  06/09/2014  DISCHARGE DIAGNOSES:  1.  Asthma exacerbation.  2.  Gastroesophageal reflux disease.  3.  Depression.  4.  History of gout.   DISCHARGE MEDICATIONS:  1.  Lisinopril 20 mg p.o. daily.  2.  Ventolin 90 mg 2 puffs b.i.d.  3.  Nexium 40 mg p.o. daily.  4.  Amitriptyline 50 mg p.o. daily.  5.  Prednisone 10 mg tablets 6 tablets on day 1, 5 tablets on day 2, 4 tablets on day 3, 3 tablets  on day 4, 2 tablets on day 5, 1 tablet on day 6.   6.  Allopurinol 300 mg p.o. daily.  7.  Augmentin is 875/125 mg p.o. b.i.d. for 10 days.  8.  Azithromycin 3-dose pack 300 mg p.o. daily for 3 days.   CONSULTATIONS: None.   HOSPITAL COURSE: This is a 54 year old male patient with history of asthma who came in  because bypass of shortness of breath and cough. The patient goes to [facility] for his medication refills. The patient admitted to hospitalist service for asthma exacerbation. See history and physical for full details. He completed tapering course of prednisone as an outpatient in because of persistent wheezing. He was admitted for the asthma exacerbation. The patient received IV steroids along with IV Rocephin and Zithromax along with nebulizers. The patient's symptoms nicely improved and his chest x-ray did not reveal pneumonia and his kidney function was normal and the patient also had CT angiogram of chest which was negative for pulmonary emboli. The patient had some pulmonary nodules which were benign. The patient's needs another CAT scan in 1 year. Patient's symptoms improved with nebulizers and steroids. The patient's chest x-ray showed left basilar opacity which was minimal and he felt better so we discharged him home with antibiotics. He does have prednisone prescriptions so I advised him to continue the prednisone. I gave him Augmentin and he wanted me to  write a nebulizer prescription so we wrote nebulizer prescription for him to take his albuterol and Atrovent nebulizers.  The patient has a history of depression, takes amitriptyline and history of gout, takes allopurinol. The patient has hypertension, takes lisinopril 20 mg p.o. daily.    DISCHARGE EXAMINATION: Temperature 97.9, heart rate 87, blood pressure 114/89, oxygen saturation 98% on room air. The patient's condition was stable. Lungs were clear on the day of discharge.  TIME SPENT: More than 30 minutes.   The patient advised to follow up with [facility].   ____________________________ Epifanio Lesches, MD sk:lt D: 06/10/2014 12:04:34 ET T: 06/10/2014 15:58:05 ET JOB#: [id number]  cc: Epifanio Lesches, MD, <Dictator> Epifanio Lesches MD ELECTRONICALLY SIGNED 06/28/2014 8:26

## 2015-03-09 NOTE — H&P (Signed)
PATIENT NAME:  Miguel [name], Miguel [name] MR#:  [id number] DATE OF BIRTH:  [birth year]  DATE OF ADMISSION:  01/23/2014  HISTORY OF PRESENT ILLNESS:  This is a 54 year old white male with a history of anxiety and gout who is postoperative day #6 status post robotically assisted laparoscopic cholecystectomy for symptomatic cholelithiasis following an extensive outpatient GI medicine evaluation. His surgery was uncomplicated except for the fact of a traumatic intubation resulting in a laceration to the right tonsillar pillar. I actually saw the patient in my office on postoperative day #1 with complaints of severe abdominal pain and throat pain. At that time, the patient had minimal abdominal findings but had complaints of significant abdominal discomfort.  He called the office earlier today with complaints of continued sore throat, worsening abdominal pain, abdominal wall bruising and inability to take p.o., nausea, some vomiting, as well as right wrist pain from an acute gout flare. The patient started was started on some colchicine. He has a disabled wife at home.   While in the Emergency Room, liver function tests were unremarkable except for a total bilirubin slightly elevated at 1.3. White count was 12.7. Hemoglobin and platelet count were unremarkable, as well as electrolytes. CT scan of the abdomen and pelvis with oral and intravenous contrast was performed and personally reviewed, demonstrating no evidence of bile leak, fluid collection around the gallbladder fossa, free fluid within the abdomen or free air. There was no obvious intrahepatic or extrahepatic biliary ductal dilatation. The pancreas appears normal as well as the duodenum.   ALLERGIES: None.   MEDICATIONS:  1.  Hydroxyzine 25 mg by mouth daily. 2.  Lisinopril 20 mg by mouth once a day. 3.  Nexium 40 mg by mouth once a day. 4.  Oxycodone 5 mg 1/2 tab by mouth every 6 hours as needed.  5.  Ventolin inhaler 2 puffs b.i.d. 6.  Allopurinol 100 mg by  mouth once a day.   PAST MEDICAL HISTORY: Significant for obesity, cholelithiasis, abdominal pain, reflux, hypertension, gout and anxiety, as well as asthma.   PAST SURGICAL HISTORY: As described above.   FAMILY HISTORY: Noncontributory.   REVIEW OF SYSTEMS: As described above.   SOCIAL HISTORY: Noncontributory.   PHYSICAL EXAMINATION: GENERAL: The patient is in no obvious distress.  VITAL SIGNS: Temperature is 98. Pulse of 110. Blood pressure is 196/115. He is 6 feet tall. Weight 210 pounds. BMI of 28.5. HEENT: Sclerae are clear. There is a small ulcer present on the right tonsillar pillar.  NEUROLOGIC: Affect is normal. Alert and oriented x 4.  LUNGS: Clear.  HEART: Regular rate and rhythm except for mild tachycardia.  ABDOMEN: Demonstrates some mild ecchymosis from the lower midline portion below the umbilicus. There are multiple trocar sites, which appear to be healing nicely. Dressings were removed. Stitches remain intact. There is no drainage. The abdomen is obese, soft. No evidence of peritoneal signs in the right upper quadrant. There is more tenderness around the lower abdomen as well as his incisions.  EXTREMITIES: Warm and well perfused. There is significant tenderness to range of motion in the right wrist with swelling and mild erythematous changes, as well as some tenderness in the right elbow.   Laboratory values as described above. CT scan as described above.   IMPRESSION: A 54 year old white male with abdominal pain, postoperative day #6 status post laparoscopic cholecystectomy, with no obvious postsurgical complication to account for all of his abdominal pain. Relating to his traumatic intubation, actually this is improved. The patient  has what appears to be a gouty attack of the right wrist and possibly the right elbow.   PLAN: The patient will be admitted, hydrated. Pain control will be provided in the form of IV narcotics. The patient will be started on Solu-Medrol 60 mg  IV every 8 hours until he can take p.o. and then transitioned over to a Solu-Medrol Dosepak. He will be given colchicine 0.6 mg by mouth once a day, as well as indomethacin 50 mg by mouth t.i.d. If he is unable to take the indomethacin, we will start out with Toradol. The plan was discussed with the patient in detail as well as the Emergency Room staff, and he is in agreement.    ____________________________ Jeannette How. Marina Gravel, MD mab:jcm D: 01/23/2014 21:18:39 ET T: 01/23/2014 21:29:23 ET JOB#: [id number]  cc: Elta Guadeloupe A. Marina Gravel, MD, <Dictator> Hortencia Conradi MD ELECTRONICALLY SIGNED 01/23/2014 22:41

## 2015-03-09 NOTE — Discharge Summary (Signed)
PATIENT NAME:  Miguel [name], Miguel [name] MR#:  [id number] DATE OF BIRTH:  November 24, 1960  DATE OF ADMISSION:  06/26/2014 DATE OF DISCHARGE:  06/27/2014  DISCHARGE DIAGNOSIS: Presyncope, likely due to orthostatic hypotension.   SECONDARY DIAGNOSES:  1.  Obesity.  2.  Gastroesophageal reflux disease.  3.  Hypertension.  4.  Gout.  5.  Anxiety.  6.  Cholelithiasis.   CONSULTATIONS: None.   PROCEDURES AND RADIOLOGY: Bilateral carotid Dopplers on 12th of August showed no hemodynamically significant stenosis, degree of narrowing less than 50% bilaterally.   A 2-D echocardiogram on 12th of August showed LVEF of 50% to 55%. Impaired LV diastolic filling and relaxation.   CT scan of the head without contrast on 11th of August showed no acute intracranial hemorrhage or abnormality. No skull fracture. Muscle spasm, but no acute fracture or dislocation. 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   CT scan of cervical spine without contrast on 11th of August showed no acute intracranial hemorrhage or acute intracranial abnormality.   Chest x-ray on 11th of August showed no acute cardiopulmonary disease.   MAJOR LABORATORY PANEL: UA on admission showed trace bacteria and 19 WBCs, 1+ leukocyte esterase.   Urine culture was negative.   HISTORY AND SHORT HOSPITAL COURSE: The patient is a 54 year old male with above-mentioned medical problems who was admitted for presyncope/syncope. Please see Dr. Ward Givens dictated history and physical for further details. The patient was found to have significant orthostatic hypotension and most likely etiology was thought to be secondary to his medication in the form of tricyclic antidepressant, which was recommended to start weaning down. The patient was in agreement with same. He was ruled out with 3 negative sets of troponins. He was feeling significantly better and was discharged home on 12th of August in stable condition on the date of discharge.   PERTINENT PHYSICAL EXAMINATION ON DAY OF  DISCHARGE:  VITAL SIGNS: As follows: Temperature 97.8, heart rate 81 per minute, respirations 18 per minute, blood pressure 154/95 mmHg, he was saturating 96% on room air.  CARDIOVASCULAR: S1, S2 normal. No murmurs, rubs or gallop.  LUNGS: Clear to auscultation bilaterally. No wheezes, rales, rhonchi or crepitation.  ABDOMEN: Soft, benign.  NEUROLOGIC: Nonfocal examination.   All other physical examination remained at baseline.   DISCHARGE MEDICATIONS:  1.  Lisinopril 20 mg p.o. daily. 2.  Ventolin 2 puffs inhaled twice a day.  3.  Nexium 40 mg p.o. daily.  4.  Allopurinol 300 mg p.o. daily.  5.  Advair 1 puff inhaled twice a day.  6.  Amitriptyline 25 mg p.o. at bedtime and requested to taper this in a week to be completely stopped per his primary care physician.   DISCHARGE DIET: Low sodium, low fat, low cholesterol.   DISCHARGE ACTIVITY: As tolerated.   DISCHARGE INSTRUCTIONS AND FOLLOWUP: The patient was instructed to follow up with his primary care physician, Dr. Margarita Rana, in 1-2 weeks. He will need followup with [facility] in 2-4 weeks and with [facility] neurology in 4-6 weeks.   TOTAL TIME DISCHARGING THIS PATIENT: Forty-five minutes.   Please also note, the instructions given to the patient to avoid more symptoms of orthostatic hypotension, which was listed in the discharge instructions given to the patient.   ____________________________ Courtni Balash S. Manuella Ghazi, MD vss:TT D: 06/27/2014 19:06:34 ET T: 06/27/2014 20:03:11 ET JOB#: [id number]  cc: Rebacca Votaw S. Manuella Ghazi, MD, <Dictator> Jerrell Belfast, MD Minna Merritts, MD Lucina Mellow [facility] MD ELECTRONICALLY SIGNED 06/28/2014 18:59

## 2015-03-09 NOTE — Discharge Summary (Signed)
PATIENT NAME:  Miguel [name], Miguel [name] MR#:  [id number] DATE OF BIRTH:  06-26-1961  DATE OF ADMISSION:  12/13/2013 DATE OF DISCHARGE:  12/15/2013  ADDENDUM  Date of admission 28th, original discharge summary done on 29th.  The patient did not go on 29th because the patient had a fall and also had a severe gout flare so we kept him overnight and discharged him on the 30th.  The patient also received an extra dose of Solu-Medrol on 29th and discharged home on 30th with prednisone and also Indocin and the patient advised to follow with [facility] to start the Allopurinol.  Time took on discharge preparation more than 30 minutes.   ____________________________ Epifanio Lesches, MD sk:ea D: 12/16/2013 23:01:00 ET T: 12/17/2013 02:28:26 ET JOB#: [id number]  cc: Epifanio Lesches, MD, <Dictator> Epifanio Lesches MD ELECTRONICALLY SIGNED 12/25/2013 16:34

## 2015-03-09 NOTE — Discharge Summary (Signed)
PATIENT NAME:  Miguel [name], Miguel [name] MR#:  [id number] DATE OF BIRTH:  Jul 22, 1961  DATE OF ADMISSION:  12/13/2013 DATE OF DISCHARGE:  12/14/2013  DISCHARGE DIAGNOSES: 1.  Polyarticular gout flare.  2.  Chronic gout.   3.  Hypertension.  4.  Anxiety.  5.  Low back pain.   DISCHARGE MEDICATIONS: 1.  Lisinopril 20 mg p.o. daily. 2.  Prilosec 20 mg p.o. daily.  3.  Ventolin 90 mcg 2 puffs 4 times daily.  4.  Hydroxyzine 25 mg daily.  5.  Colchicine 0.6 mg p.o. daily.  6.  Indomethacin 50 mg p.o. t.i.d.  7.  Prednisone 20 mg 3 tablets p.o. daily for 2 days, 2 tablets daily for 2 days, 1 tablet daily for 2 days and then stop.  DIET: Low-sodium.  CONSULTATIONS: None.   HOSPITAL COURSE: This is a 54 year old male patient with history of gout and hypertension who came in because of left ankle pain, left elbow pain and also left hip pain. alog with swlling,redness,unable to ambulate. The patient had an EGD and colonoscopy yesterday and before the procedure itself he did have a gout flare, but this procedure was canceled 2 months ago because of gout flare so this time he did not want to cancel it so he had that procedure done. After the procedure, the patient came here because he could not take the pain, his left ankle and elbow pain, so he came to the ER and he was unable to walk here and he has a handicapped wife so he could not go home. He was admitted to observation status for acute gout flare. The patient was started on his colchicine and prednisone and also pain medications with Percocet. The patient today feels much better. There is some swelling of the left ankle but better than yesterday The patient does have some swelling and redness at the right ankle, but swelling and redness of the right elbow and right wrist has definitely decreased. The patient is able to move them. The patient today wants to go home so we are giving extra dose of IV Solu-Medrol 60 mg 1 time and he can go back and can continue to  take prednisone and continue colchicine. The patient is to start on allopurinol so I spoke with him. He is following up with the [facility] and he needs a prescription. The patient is to follow up with them for his allopurinol. The patient does have hypertension, which is controlled, and anxiety.   LABORATORY AND DIAGNOSTICS: Ankle x-ray showed no fracture. Elbow x-ray on the right side showed no fracture.   ESR is 14. Uric acid is 8. WBC 15 on admission. The patient just finished steroid tapering and also still has the flare. Electrolytes are within normal limits. Kidney function is normal.   The patient had arthrocentesis of the right elbow and it has monosodium urate crystals in the joint and body fluid culture did not show any growth so far.   VITALS: The patient is febrile and blood pressure at the time of discharge 123/77. He is stable to go home.   TIME SPENT ON DISCHARGE PREPARATION: More than 30 minutes.  ____________________________ Epifanio Lesches, MD sk:sb D: 12/14/2013 13:57:28 ET T: 12/14/2013 14:36:26 ET JOB#: [id number]  cc: Epifanio Lesches, MD, <Dictator> Epifanio Lesches MD ELECTRONICALLY SIGNED 12/25/2013 15:14

## 2015-03-09 NOTE — H&P (Signed)
PATIENT NAME:  Miguel [name], Miguel [name] MR#:  [id number] DATE OF BIRTH:  November 06, 1961  DATE OF ADMISSION:  01/30/2014  PRIMARY CARE PHYSICIAN: Margarita Rana, MD, or the [facility].  ADMITTING PHYSICIAN: Dr. Pat Patrick.   CHIEF COMPLAINT: Abdominal pain.   BRIEF HISTORY: Miguel [name] is a 54 year old gentleman with multiple medical problems including hypertension, nephrolithiasis, chronic abdominal pain, and gout who presents back to the Emergency Room with another episode of abdominal pain. He has had an extensive GI work-up for chronic right upper quadrant, midepigastric, right flank, abdominal pain. He does have a history of nephrolithiasis but the current symptoms did not appear to be nephrolithiasis and none of the work-up underscores that particular diagnosis.   He was seen by the GI service and underwent multiple screening endoscopies. Gallbladder ultrasound revealed multiple polyps, possible sludge. He does have nausea and with his abdominal symptoms, his primary care physicians felt that he had biliary tract disease. We saw him on several occasions, but he persisted in requesting surgical intervention.   After appropriate preoperative preparation and informed consent, he underwent surgery on March 5. The procedure was uncomplicated. A robot-assisted laparoscopic cholecystectomy was performed and discharged home the same day. He returned on day 4 complaining of chronic and more subacute abdominal pain.   Work-up at that time including CT scan in the Emergency Room revealed no significant abnormalities other than a slightly elevated white blood cell count. He was readmitted to our service by the on-call surgeon, observed for 24 hours, and no significant findings were encountered. He was able to tolerate a regular diet and was discharged home.   He was seen back in the office where he seemed to be progressing normally. Last evening he developed more right upper quadrant pain associated with one episode of vomiting  this morning. He denies any fever. He has been voiding spontaneously and having normal bowel function.   His pain medicine at home was unable to control symptoms. He presented back to the Emergency Room for further evaluation this evening. Ultrasound was performed which demonstrated no evidence of any leak or evidence for postoperative complication. CT scan was not repeated.   LABORATORY VALUES: Liver function studies were unremarkable. White blood cell count was 7000. Because of his persistent symptoms and the fact he had recently undergone surgery, the surgical service was reconsulted.   The patient is no longer taking his gout medicines. THE INDOMETHACIN HE WAS DISCHARGED ON HAS IRRITATED HIS STOMACH. He does have a history of chronic back pain and severe anxiety.   SOCIAL HISTORY: He is not a cigarette smoker. He lives with his disabled wife.   MEDICATIONS: Include colchicine 0.6 mg p.o. daily, hydroxyzine 25 mg once a day, indomethacin 50 mg t.i.d., although he stopped that 2 days ago. Lisinopril 20 mg once a day, Prilosec 20 mg once a day, and Ventolin inhalers.   REVIEW OF SYSTEMS: Otherwise unremarkable. Specifically denies any shortness of breath or pleuritic chest pain. No lower extremity complaints of swelling or edema.   PHYSICAL EXAMINATION:  GENERAL: Alert, slightly agitated and anxious gentleman in no significant distress.  VITAL SIGNS: Blood pressure is 170/90. Heart rate 73 and regular. Pain scale is 10. He is afebrile.  HEENT: No scleral icterus. No pupillary abnormalities. No facial deformities.  NECK: Supple, nontender with a midline trachea. No adenopathy.  CHEST: Clear with no adventitious sounds.  CARDIAC: No murmurs, rubs or gallops to my ear. He seems to be in normal sinus rhythm.  ABDOMEN:  Benign. He does have some bruising about his umbilicus; otherwise, no significant abnormalities. No point tenderness. No rebound, no guarding. No masses noted.  EXTREMITIES: Lower  extremity exam reveals full range of motion. No deformities. No gouty symptoms or findings.  PSYCHIATRIC: Some significant anxiety and agitation but normal orientation.  LABORATORIES: Values were unremarkable.   Gallbladder ultrasound performed this evening demonstrates no evidence for a postoperative problem.   ASSESSMENT AND PLAN: I think it is clear that this gentleman's symptoms initially were not related to his gallbladder disease as he did not have any improvement with his symptoms following gallbladder surgery. He does appear to be a pain-control issue and does not want to go home this evening. There must be some psychological overlay in this situation perhaps involved with his disabled wife.   We will admit him to the hospital, work on pain control, repeat a HIDA scan to be certain there is no evidence of any bile duct leak. If his symptoms persist, we will ask GI to see him for possible sphincter of Oddi dysfunction. However, at the present time, there does not appear any evidence of acute intraabdominal or postoperative problems.   This plan has been discussed with the patient. He is in agreement.    ____________________________ Micheline Maze, MD rle:np D: 01/30/2014 20:57:33 ET T: 01/30/2014 22:03:02 ET JOB#: [id number]  cc: Rodena Goldmann III, MD, <Dictator> Lucilla Lame, MD Rodena Goldmann MD ELECTRONICALLY SIGNED 02/01/2014 0:19

## 2015-03-09 NOTE — H&P (Signed)
PATIENT NAME:  Miguel [name], Miguel [name] MR#:  [id number] DATE OF BIRTH:  1961/01/02  DATE OF ADMISSION:  06/26/2014  PRIMARY CARE PHYSICIAN: Dr. Margarita Rana.   REFERRING PHYSICIAN: Dr. Ferman Hamming  CHIEF COMPLAINT: Syncope.   HISTORY OF PRESENT ILLNESS: Miguel [name] is a 54 year old male with a history of obesity and hypertension who comes to the Emergency Department with complaints of frequent falls and dizziness and an episode of syncope last night. The patient states was recently treated for a sinus infection with Augmentin and azithromycin. For the last 2 days, has been experiencing dizziness, especially upon standing up. Last night as the patient stood up was feeling dizzy, fell down to the floor, lost consciousness for a few seconds. Concerning about the frequent deficit, the patient came to the Emergency Department. Work-up in the Emergency Department with urinalysis shows 1+ leukocyte esterase, WBC of 19.  CT of cervical spine, mild joint disease. CT head without contrast: No acute intracranial abnormalities were noted. One set of cardiac enzymes are negative, slightly elevated BUN of 21 and a creatinine of 1.38.   MEDICATIONS:  1. Lisinopril 20 mg daily.  2. Nexium 40 mg daily.  3. Ventolin as needed.  4. Albuterol 100 mg daily.  5. Advair Diskus.   ALLERGIES: No known drug allergies.   PAST MEDICAL HISTORY:  1. Obesity.  2. Cholelithiasis.  3. Gastroesophageal reflex disease.  4. Hypertension.  5. Gout.  6. Anxiety.   PAST SURGICAL HISTORY:  Cholecystectomy.   SOCIAL HISTORY: The patient quit smoking more than 15 years back. Occasional alcohol use. Denies any illicit drugs. Lives at home with his wife, who has MS, and takes care of his wife.   FAMILY HISTORY: Significant for hypertension.   REVIEW OF SYSTEMS.  CONSTITUTIONAL: Experiencing generalized weakness.  EYES: No change in vision.  ENT: No change in hearing.  RESPIRATORY: Has mild cough with no productive sputum. No  shortness of breath.  CARDIOVASCULAR: No chest pain, palpations.  GASTROINTESTINAL: No nausea, vomiting, abdominal pain.  GENITOURINARY: No dysuria or hematuria.  ENDOCRINE: No polyuria or polydipsia.  HEME: No easy bruising or bleeding.  SKIN: No rash or lesions.  MUSCULOSKELETAL: No joint pains and aches.  NEUROLOGIC: No weakness or numbness in any part of the body.   PHYSICAL EXAMINATION:  GENERAL: This is a well-built, well-nourished, age-appropriate male lying down in the bed, not in distress.  VITAL SIGNS: Temperature 98.2, pulse 98, blood pressure 144/95, respiratory rate of 16, oxygen saturation is 99% on room air.  HEENT: Head normocephalic, atraumatic. There is no scleral icterus. Conjunctivae normal. Pupils equal and reactive. Extraocular movements are intact. Mucous membranes moist. No pharyngeal erythema.  NECK: Supple. No lymphadenopathy. No JVD. No carotid bruit.  CHEST: Has no focal tenderness. LUNGS: Bilaterally clear to auscultation.  HEART: S1, S2 regular. No murmurs are heard.  ABDOMEN: Bowel sounds present. Soft, nontender, nondistended.  EXTREMITIES: No pedal edema. Pulses 2+.  NEUROLOGIC: The patient is alert, oriented to place, person, and time. Cranial nerves II through XII intact. Motor 5/5 in upper and lower extremities.   LABORATORY DATA: CT head without contrast: No acute intracranial abnormality. Chest x-ray, one view portable: No acute cardiopulmonary disease.   Troponins less than 0.02.   Complete metabolic panel: BUN 21, creatinine of 1.3. The rest of the values are within normal limits.   CBC is completely within normal limits.   ASSESSMENT AND PLAN:  Miguel [name] is a 54 year old male who comes with an episode of  syncope.   1. Syncope. We will obtain orthostatic blood pressure, admit the patient to a monitored bed. We will cycle cardiac enzymes x 3. Unlikely this is a cardiac cause. The symptoms seems to be more from the orthostatic hypertension. Also  concern about the patient recently was treated for sinusitis and also, there is a possibility vestibular neuritis; however, the patient does not have any ringing in the ears, decreased hearing. Will also obtain echocardiogram and carotid Dopplers. If work-up is negative, the patient could be discharged home. We will give fluids overnight.  2. Hypertension. Continue the lisinopril.  3. Obesity. Will need further counseling.  4. Gastroesophageal reflex disease. Continue with the Nexium.  5. Keep the patient on deep vein thrombosis prophylaxis with Lovenox.   TIME SPENT: 50 minutes.    ____________________________ Monica Becton, MD pv:JT D: 06/26/2014 23:47:13 ET T: 06/27/2014 00:12:16 ET JOB#: [id number]  cc: Monica Becton, MD, <Dictator> Jerrell Belfast, MD Grier Mitts Rianne Degraaf MD ELECTRONICALLY SIGNED 07/04/2014 21:28

## 2015-03-09 NOTE — Discharge Summary (Signed)
PATIENT NAME:  Miguel [name], Miguel [name] MR#:  [id number] DATE OF BIRTH:  06/17/1961  DATE OF ADMISSION:  01/30/2014 DATE OF DISCHARGE:  02/01/2014   BRIEF HISTORY: [name] is a 54 year old gentleman readmitted to the hospital following his laparoscopic cholecystectomy. The patient has had atypical biliary tract symptoms with evidence of polyps and sludge on his gallbladder studies. He had a negative GI workup, and with his persistent symptoms, had been encouraged by his primary care physician to consider surgical intervention. Despite his atypical history, we took him to surgery 2 weeks ago, and he underwent an uncomplicated procedure. He returned within 24 hours to the office complaining of significant abdominal pain. His symptoms improved, but he was evaluated once again in the Emergency Room and admitted overnight for persistent right upper quadrant pain. It became apparent at that point that his symptoms were not related to his biliary tract. CT scan and ultrasound were unremarkable. He was evaluated again in the office 4 or 5 days later and noted persistent discomfort and then returned to the Emergency Room on the evening of admission, complaining of persistent abdominal pain. He was readmitted once again for pain control.   HOSPITAL COURSE: Laboratory values were unremarkable. His white blood cell count was normal. His liver function studies were within normal limits. HIDA scan was performed on the 18th which did not reveal any evidence of a leak, and he had normal emptying into his GI tract. This morning, he is up, ambulating, tolerating a diet, with improvement in his symptoms. Will discharge him home today to be followed in the office in 7 to 10 days' time. Bathing, activity and driving instructions were given to the patient.   DISCHARGE MEDICATIONS: Include:  1. Lisinopril 20 mg p.o. daily. 2. Ventolin inhaler 90 mcg 2 puffs b.i.d. 3. Nexium 40 mg once a day.  4. Oxycodone 2.5 mg every 6 hours p.r.n. 5.  Promethazine 12.5 mg every 3 hours p.r.n.  6. Amitriptyline 50 mg p.o. at bedtime.   FINAL DISCHARGE DIAGNOSIS: Postoperative abdominal pain.    ____________________________ Micheline Maze, MD rle:lb D: 02/01/2014 06:15:43 ET T: 02/01/2014 08:56:13 ET JOB#: [id number]  cc: Micheline Maze, MD, <Dictator> [facility] Jerrell Belfast, MD Rodena Goldmann MD ELECTRONICALLY SIGNED 02/01/2014 21:44

## 2015-03-22 DIAGNOSIS — S0990XA Unspecified injury of head, initial encounter: Secondary | ICD-10-CM | POA: Insufficient documentation

## 2015-03-22 DIAGNOSIS — W109XXA Fall (on) (from) unspecified stairs and steps, initial encounter: Secondary | ICD-10-CM | POA: Insufficient documentation

## 2015-03-22 DIAGNOSIS — Z79899 Other long term (current) drug therapy: Secondary | ICD-10-CM | POA: Insufficient documentation

## 2015-03-22 DIAGNOSIS — Y9389 Activity, other specified: Secondary | ICD-10-CM | POA: Insufficient documentation

## 2015-03-22 DIAGNOSIS — S01512A Laceration without foreign body of oral cavity, initial encounter: Secondary | ICD-10-CM | POA: Insufficient documentation

## 2015-03-22 DIAGNOSIS — Y9289 Other specified places as the place of occurrence of the external cause: Secondary | ICD-10-CM | POA: Insufficient documentation

## 2015-03-22 DIAGNOSIS — Y998 Other external cause status: Secondary | ICD-10-CM | POA: Insufficient documentation

## 2015-03-22 DIAGNOSIS — Z87891 Personal history of nicotine dependence: Secondary | ICD-10-CM | POA: Insufficient documentation

## 2015-03-22 DIAGNOSIS — I10 Essential (primary) hypertension: Secondary | ICD-10-CM | POA: Insufficient documentation

## 2015-03-23 ENCOUNTER — Encounter: Payer: Self-pay | Admitting: *Deleted

## 2015-03-23 ENCOUNTER — Emergency Department
Admission: EM | Admit: 2015-03-23 | Discharge: 2015-03-23 | Disposition: A | Discharge status: Home / Self Care | Payer: Self-pay | Attending: Emergency Medicine | Admitting: Emergency Medicine

## 2015-03-23 ENCOUNTER — Emergency Department: Payer: Self-pay

## 2015-03-23 DIAGNOSIS — S01512A Laceration without foreign body of oral cavity, initial encounter: Secondary | ICD-10-CM

## 2015-03-23 DIAGNOSIS — S0990XA Unspecified injury of head, initial encounter: Secondary | ICD-10-CM

## 2015-03-23 HISTORY — DX: Essential (primary) hypertension: I10

## 2015-03-23 HISTORY — DX: Gout, unspecified: M10.9

## 2015-03-23 MED ORDER — ACETAMINOPHEN 325 MG PO TABS
650.0000 mg | ORAL_TABLET | Freq: Once | ORAL | Status: AC
  Administered 2015-03-23: 650 mg via ORAL

## 2015-03-23 MED ORDER — ACETAMINOPHEN 325 MG PO TABS
ORAL_TABLET | ORAL | Status: AC
  Filled 2015-03-23: qty 2

## 2015-03-23 NOTE — ED Notes (Signed)
Pt presents after fall, struck face and R posterior ribs. Pt was positive for loss of consciousness, unknown amount of time. Pt presents w/ c/o pain in forehead and where face is abraded under nose. Pt A & O x 4.

## 2015-03-23 NOTE — ED Notes (Signed)
Pt removed C-collar at 0439. Pt stated "my neck hurst worse now that when I came in." Pt had been cautioned upon C-collar application that it was for the purpose of maintaining c-spine integrity.

## 2015-03-23 NOTE — Discharge Instructions (Signed)
Please follow-up with your doctor or Dr[facility] walk-in clinic in 1-2 days for reevaluation.  Return to the ER right away should he develop severe headache, fever, vomiting, confusion, swelling on the inner lip or pus draining, chest pain, weakness, or other new or personally concerning symptoms arise.  Concussion A concussion, or closed-head injury, is a brain injury caused by a direct blow to the head or by a quick and sudden movement (jolt) of the head or neck. Concussions are usually not life-threatening. Even so, the effects of a concussion can be serious. If you have had a concussion before, you are more likely to experience concussion-like symptoms after a direct blow to the head.  CAUSES  Direct blow to the head, such as from running into another player during a soccer game, being hit in a fight, or hitting your head on a hard surface.  A jolt of the head or neck that causes the brain to move back and forth inside the skull, such as in a car crash. SIGNS AND SYMPTOMS The signs of a concussion can be hard to notice. Early on, they may be missed by you, family members, and health care providers. You may look fine but act or feel differently. Symptoms are usually temporary, but they may last for days, weeks, or even longer. Some symptoms may appear right away while others may not show up for hours or days. Every head injury is different. Symptoms include:  Mild to moderate headaches that will not go away.  A feeling of pressure inside your head.  Having more trouble than usual:  Learning or remembering things you have heard.  Answering questions.  Paying attention or concentrating.  Organizing daily tasks.  Making decisions and solving problems.  Slowness in thinking, acting or reacting, speaking, or reading.  Getting lost or being easily confused.  Feeling tired all the time or lacking energy (fatigued).  Feeling drowsy.  Sleep disturbances.  Sleeping more than  usual.  Sleeping less than usual.  Trouble falling asleep.  Trouble sleeping (insomnia).  Loss of balance or feeling lightheaded or dizzy.  Nausea or vomiting.  Numbness or tingling.  Increased sensitivity to:  Sounds.  Lights.  Distractions.  Vision problems or eyes that tire easily.  Diminished sense of taste or smell.  Ringing in the ears.  Mood changes such as feeling sad or anxious.  Becoming easily irritated or angry for little or no reason.  Lack of motivation.  Seeing or hearing things other people do not see or hear (hallucinations). DIAGNOSIS Your health care provider can usually diagnose a concussion based on a description of your injury and symptoms. He or she will ask whether you passed out (lost consciousness) and whether you are having trouble remembering events that happened right before and during your injury. Your evaluation might include:  A brain scan to look for signs of injury to the brain. Even if the test shows no injury, you may still have a concussion.  Blood tests to be sure other problems are not present. TREATMENT  Concussions are usually treated in an emergency department, in urgent care, or at a clinic. You may need to stay in the hospital overnight for further treatment.  Tell your health care provider if you are taking any medicines, including prescription medicines, over-the-counter medicines, and natural remedies. Some medicines, such as blood thinners (anticoagulants) and aspirin, may increase the chance of complications. Also tell your health care provider whether you have had alcohol or are taking  illegal drugs. This information may affect treatment.  Your health care provider will send you home with important instructions to follow.  How fast you will recover from a concussion depends on many factors. These factors include how severe your concussion is, what part of your brain was injured, your age, and how healthy you were  before the concussion.  Most people with mild injuries recover fully. Recovery can take time. In general, recovery is slower in older persons. Also, persons who have had a concussion in the past or have other medical problems may find that it takes longer to recover from their current injury. HOME CARE INSTRUCTIONS General Instructions  Carefully follow the directions your health care provider gave you.  Only take over-the-counter or prescription medicines for pain, discomfort, or fever as directed by your health care provider.  Take only those medicines that your health care provider has approved.  Do not drink alcohol until your health care provider says you are well enough to do so. Alcohol and certain other drugs may slow your recovery and can put you at risk of further injury.  If it is harder than usual to remember things, write them down.  If you are easily distracted, try to do one thing at a time. For example, do not try to watch TV while fixing dinner.  Talk with family members or close friends when making important decisions.  Keep all follow-up appointments. Repeated evaluation of your symptoms is recommended for your recovery.  Watch your symptoms and tell others to do the same. Complications sometimes occur after a concussion. Older adults with a brain injury may have a higher risk of serious complications, such as a blood clot on the brain.  Tell your teachers, school nurse, school counselor, coach, athletic trainer, or work Freight forwarder about your injury, symptoms, and restrictions. Tell them about what you can or cannot do. They should watch for:  Increased problems with attention or concentration.  Increased difficulty remembering or learning new information.  Increased time needed to complete tasks or assignments.  Increased irritability or decreased ability to cope with stress.  Increased symptoms.  Rest. Rest helps the brain to heal. Make sure you:  Get plenty of  sleep at night. Avoid staying up late at night.  Keep the same bedtime hours on weekends and weekdays.  Rest during the day. Take daytime naps or rest breaks when you feel tired.  Limit activities that require a lot of thought or concentration. These include:  Doing homework or job-related work.  Watching TV.  Working on the computer.  Avoid any situation where there is potential for another head injury (football, hockey, soccer, basketball, martial arts, downhill snow sports and horseback riding). Your condition will get worse every time you experience a concussion. You should avoid these activities until you are evaluated by the appropriate follow-up health care providers. Returning To Your Regular Activities You will need to return to your normal activities slowly, not all at once. You must give your body and brain enough time for recovery.  Do not return to sports or other athletic activities until your health care provider tells you it is safe to do so.  Ask your health care provider when you can drive, ride a bicycle, or operate heavy machinery. Your ability to react may be slower after a brain injury. Never do these activities if you are dizzy.  Ask your health care provider about when you can return to work or school. Preventing Another Concussion It  is very important to avoid another brain injury, especially before you have recovered. In rare cases, another injury can lead to permanent brain damage, brain swelling, or death. The risk of this is greatest during the first 7-10 days after a head injury. Avoid injuries by:  Wearing a seat belt when riding in a car.  Drinking alcohol only in moderation.  Wearing a helmet when biking, skiing, skateboarding, skating, or doing similar activities.  Avoiding activities that could lead to a second concussion, such as contact or recreational sports, until your health care provider says it is okay.  Taking safety measures in your  home.  Remove clutter and tripping hazards from floors and stairways.  Use grab bars in bathrooms and handrails by stairs.  Place non-slip mats on floors and in bathtubs.  Improve lighting in dim areas. SEEK MEDICAL CARE IF:  You have increased problems paying attention or concentrating.  You have increased difficulty remembering or learning new information.  You need more time to complete tasks or assignments than before.  You have increased irritability or decreased ability to cope with stress.  You have more symptoms than before. Seek medical care if you have any of the following symptoms for more than 2 weeks after your injury:  Lasting (chronic) headaches.  Dizziness or balance problems.  Nausea.  Vision problems.  Increased sensitivity to noise or light.  Depression or mood swings.  Anxiety or irritability.  Memory problems.  Difficulty concentrating or paying attention.  Sleep problems.  Feeling tired all the time. SEEK IMMEDIATE MEDICAL CARE IF:  You have severe or worsening headaches. These may be a sign of a blood clot in the brain.  You have weakness (even if only in one hand, leg, or part of the face).  You have numbness.  You have decreased coordination.  You vomit repeatedly.  You have increased sleepiness.  One pupil is larger than the other.  You have convulsions.  You have slurred speech.  You have increased confusion. This may be a sign of a blood clot in the brain.  You have increased restlessness, agitation, or irritability.  You are unable to recognize people or places.  You have neck pain.  It is difficult to wake you up.  You have unusual behavior changes.  You lose consciousness. MAKE SURE YOU:  Understand these instructions.  Will watch your condition.  Will get help right away if you are not doing well or get worse. Document Released: 01/23/2004 Document Revised: 11/07/2013 Document Reviewed:  05/25/2013 [facility] Patient Information 2015 [place], [place]. This information is not intended to replace advice given to you by your health care provider. Make sure you discuss any questions you have with your health care provider.  Mouth Laceration A mouth laceration is a cut inside the mouth. TREATMENT  Because of all the bacteria in the mouth, lacerations are usually not stitched (sutured) unless the wound is gaping open. Sometimes, a couple sutures may be placed just to hold the edges of the wound together and to speed healing. Over the next 1 to 2 days, you will see that the wound edges appear gray in color. The edges may appear ragged and slightly spread apart. Because of all the normal bacteria in the mouth, these wounds are contaminated, but this is not an infection that needs antibiotics. Most wounds heal with no problems despite their appearance. HOME CARE INSTRUCTIONS   Rinse your mouth with a warm, saltwater wash 4 to 6 times per day, or  as your caregiver instructs.  Continue oral hygiene and gentle tooth brushing as normal, if possible.  Do not eat or drink hot food or beverages while your mouth is still numb.  Eat a bland diet to avoid irritation from acidic foods.  Only take over-the-counter or prescription medicines for pain, discomfort, or fever as directed by your caregiver.  Follow up with your caregiver as instructed. You may need to see your caregiver for a wound check in 48 to 72 hours to make sure your wound is healing.  If your laceration was sutured, do not play with the sutures or knots with your tongue. If you do this, they will gradually loosen and may become untied. You may need a tetanus shot if:  You cannot remember when you had your last tetanus shot.  You have never had a tetanus shot. If you get a tetanus shot, your arm may swell, get red, and feel warm to the touch. This is common and not a problem. If you need a tetanus shot and you choose not to have  one, there is a rare chance of getting tetanus. Sickness from tetanus can be serious. SEEK MEDICAL CARE IF:   You develop swelling or increasing pain in the wound or in other parts of your face.  You have a fever.  You develop swollen, tender glands in the throat.  You notice the wound edges do not stay together after your sutures have been removed.  You see pus coming from the wound. Some drainage in the mouth is normal. MAKE SURE YOU:   Understand these instructions.  Will watch your condition.  Will get help right away if you are not doing well or get worse. Document Released: 11/02/2005 Document Revised: 01/25/2012 Document Reviewed: 05/07/2011 [facility] Patient Information 2015 [place], [place]. This information is not intended to replace advice given to you by your health care provider. Make sure you discuss any questions you have with your health care provider.

## 2015-03-23 NOTE — ED Provider Notes (Signed)
[facility] Emergency Department Provider Note    ____________________________________________  Time seen: 4:30 AM  I have reviewed the triage vital signs and the nursing notes.   HISTORY  Chief Complaint Head Injury  HPI Miguel [name] is a 54 y.o. male who reports that this afternoon he was walking up 2 steps and slipped on water. He fell forward and hit the front of his face on a step. He initially did not have loss of consciousness, but walked back into the house and said he got lightheaded and slightly dizzy with headache. He then had to lay down, he is unsure if he passed out, but does not specifically recall being unconscious. He does report that he may have passed out, but it is slightly unclear.  Initially patient said his neck felt okay after being in the ER and my evaluation he states his neck is now hurting, possibly because of the car he was wearing.  He denies numbness tingling, chest pain shortness of breath, or other concerns. He is confident that the reason he fell was because of slipping on water.  Patient reports occasional but not daily alcohol use, he also reports that he is a nonsmoker and does not use any drugs.  Patient states his left nostril is slightly sore, he did slightly bite the inside of his upper lip which bled briefly but is improved now. Onset event was sudden. Pain is noted to be soreness over the left side of the face.   Past Medical History  Diagnosis Date  . Hypertension   . Gout     There are no active problems to display for this patient.   History reviewed. No pertinent past surgical history.  Current Outpatient Rx  Name  Route  Sig  Dispense  Refill  . allopurinol (ZYLOPRIM) 100 MG tablet   Oral   Take 100 mg by mouth daily.         Marland Kitchen lisinopril (PRINIVIL,ZESTRIL) 20 MG tablet   Oral   Take 20 mg by mouth daily.           Allergies Review of patient's allergies indicates no known  allergies.  History reviewed. No pertinent family history.  Social History History  Substance Use Topics  . Smoking status: Former Research scientist (life sciences)  . Smokeless tobacco: Not on file  . Alcohol Use: No     Comment: occasionally    Review of Systems  Constitutional: Negative for fever. Eyes: Negative for visual changes. ENT: Negative for sore throat. Cardiovascular: Negative for chest pain. Respiratory: Negative for shortness of breath. Gastrointestinal: Negative for abdominal pain, vomiting and diarrhea. Genitourinary: Negative for dysuria. Musculoskeletal: Negative for back pain. Skin: Negative for rash. Neurological: Mild headache headaches, no focal weakness or numbness. Denies dental injury  10-point ROS otherwise negative.  ____________________________________________   PHYSICAL EXAM:  VITAL SIGNS: ED Triage Vitals  Enc Vitals Group     BP 03/23/15 0126 122/76 mmHg     Pulse Rate 03/23/15 0126 119     Resp 03/23/15 0126 16     Temp 03/23/15 0126 97.7 F (36.5 C)     Temp Source 03/23/15 0126 Oral     SpO2 03/23/15 0126 96 %     Weight 03/23/15 0126 215 lb (97.523 kg)     Height 03/23/15 0126 6' (1.829 m)     Head Cir --      Peak Flow --      Pain Score 03/23/15 0503 7  Pain Loc --      Pain Edu? --      Excl. in [place]? --      Constitutional: Alert and oriented. Well appearing and in no distress. Eyes: Conjunctivae are normal. PERRL. Normal extraocular movements. ENT   Head: Normocephalic and atraumatic except for a small contusion underneath the left naris and mild tenderness to palpation over the left frontal scalp.   Nose: No congestion/rhinnorhea.   Mouth/Throat: Mucous membranes are moist. There is poor dentition, no dental fractures noted, patient does have a small and now healed inner upper gum laceration that is not through and through.   Neck: No stridor. There is mild tenderness to palpation of the mid cervical spine and no step-offs. The  patient does have tenderness that seems increased over the right trapezius. Manger of spine is nontender. Hematological/Lymphatic/Immunilogical: No cervical lymphadenopathy. Cardiovascular: Normal rate, regular rhythm. Normal and symmetric distal pulses are present in all extremities. No murmurs, rubs, or gallops. Respiratory: Normal respiratory effort without tachypnea nor retractions. Breath sounds are clear and equal bilaterally. No wheezes/rales/rhonchi. Gastrointestinal: Soft and nontender. No distention. No abdominal bruits. There is no CVA tenderness. Genitourinary:  Musculoskeletal: Nontender with normal range of motion in all extremities. No joint effusions.  No lower extremity tenderness nor edema. Neurologic:  Normal speech and language. No gross focal neurologic deficits are appreciated. Speech is normal. No gait instability. Skin:  Skin is warm, dry and intact. No rash noted. Psychiatric: Mood and affect are normal. Speech and behavior are normal. Patient exhibits appropriate insight and judgment.  ____________________________________________    LABS (pertinent positives/negatives)  Labs Reviewed - No data to display   ____________________________________________   EKG    ____________________________________________    RADIOLOGY  IMPRESSION: Left frontal scalp hematoma. No acute intracranial findings. Minimal generalized atrophy.  ____________________________________________   PROCEDURES  Procedure(s) performed: None  Critical Care performed: No  ____________________________________________   INITIAL IMPRESSION / ASSESSMENT AND PLAN / ED COURSE  Pertinent labs & imaging results that were available during my care of the patient were reviewed by me and considered in my medical decision making (see chart for details).  Mechanical fall. Closed head injury. Small abrasion under the left naris. There is no evidence to support cardiopulmonary etiology for  injury. Patient is quite clear that she slipped on water. We have obtained CT head and CT cervical spine to rule out intracranial injury.  CT head does not show intracranial injury.    ____________________________________________   FINAL CLINICAL IMPRESSION(S) / ED DIAGNOSES  Final diagnoses:  None   closed head injury, left frontal scalp hematoma, contusion left face, tiny intraoral laceration which has healed over intraoral mucosa. Initial acute  Delman Kitten, MD 03/23/15 [phone]

## 2015-03-25 ENCOUNTER — Emergency Department: Payer: Self-pay

## 2015-03-25 ENCOUNTER — Encounter: Payer: Self-pay | Admitting: Emergency Medicine

## 2015-03-25 ENCOUNTER — Emergency Department
Admission: EM | Admit: 2015-03-25 | Discharge: 2015-03-25 | Disposition: A | Discharge status: Home / Self Care | Payer: Self-pay | Attending: Emergency Medicine | Admitting: Emergency Medicine

## 2015-03-25 DIAGNOSIS — M545 Low back pain, unspecified: Secondary | ICD-10-CM

## 2015-03-25 DIAGNOSIS — I10 Essential (primary) hypertension: Secondary | ICD-10-CM | POA: Insufficient documentation

## 2015-03-25 DIAGNOSIS — N200 Calculus of kidney: Secondary | ICD-10-CM | POA: Insufficient documentation

## 2015-03-25 DIAGNOSIS — Z79899 Other long term (current) drug therapy: Secondary | ICD-10-CM | POA: Insufficient documentation

## 2015-03-25 DIAGNOSIS — M542 Cervicalgia: Secondary | ICD-10-CM | POA: Insufficient documentation

## 2015-03-25 DIAGNOSIS — Z87891 Personal history of nicotine dependence: Secondary | ICD-10-CM | POA: Insufficient documentation

## 2015-03-25 LAB — URINALYSIS COMPLETE WITH MICROSCOPIC (ARMC ONLY)
Bacteria, UA: NONE SEEN
Glucose, UA: NEGATIVE mg/dL
Ketones, ur: NEGATIVE mg/dL
Nitrite: NEGATIVE
Protein, ur: 30 mg/dL — AB
Specific Gravity, Urine: 1.036 — ABNORMAL HIGH (ref 1.005–1.030)
pH: 5 (ref 5.0–8.0)

## 2015-03-25 MED ORDER — CYCLOBENZAPRINE HCL 5 MG PO TABS: 5.0000 mg | ORAL_TABLET | Freq: Three times a day (TID) | ORAL | Status: DC | PRN

## 2015-03-25 MED ORDER — KETOROLAC TROMETHAMINE 60 MG/2ML IM SOLN
INTRAMUSCULAR | Status: AC
  Filled 2015-03-25: qty 2

## 2015-03-25 MED ORDER — KETOROLAC TROMETHAMINE 10 MG PO TABS: 10.0000 mg | ORAL_TABLET | Freq: Three times a day (TID) | ORAL | Status: DC

## 2015-03-25 MED ORDER — KETOROLAC TROMETHAMINE 60 MG/2ML IM SOLN
60.0000 mg | Freq: Once | INTRAMUSCULAR | Status: AC
  Administered 2015-03-25: 60 mg via INTRAMUSCULAR

## 2015-03-25 NOTE — ED Provider Notes (Signed)
[facility] Emergency Department Provider Note ?____________________________________________ ? Time seen: ?3:27 PM on 03/25/2015 -----------------------------------------  I have reviewed the triage vital signs and the nursing notes. ________ HISTORY ? Chief Complaint Neck Pain  HPI  Miguel [name] is a 54 y.o. male who was seen here in the ED 2 days prior for evaluation of injuries sustained in a fall at home.  He was treated for a head contusion and facial hematoma.  CT scans of the head and neck were normal.  He returns with c/o continued neck pain and popping with range.  He also c/o LBP on the left flank.  He denies reinjury in the interim.  He has been dosing Aleve for symptom relief.   Past Medical History  Diagnosis Date  . Hypertension   . Gout    There are no active problems to display for this patient.  History reviewed. No pertinent past surgical history. ? Current Outpatient Rx  Name  Route  Sig  Dispense  Refill  . esomeprazole (NEXIUM) 40 MG capsule   Oral   Take 40 mg by mouth daily at 12 noon.         Marland Kitchen allopurinol (ZYLOPRIM) 100 MG tablet   Oral   Take 100 mg by mouth daily.         . cyclobenzaprine (FLEXERIL) 5 MG tablet   Oral   Take 1 tablet (5 mg total) by mouth 3 (three) times daily as needed for muscle spasms.   15 tablet   0   . ketorolac (TORADOL) 10 MG tablet   Oral   Take 1 tablet (10 mg total) by mouth every 8 (eight) hours.   15 tablet   0   . lisinopril (PRINIVIL,ZESTRIL) 20 MG tablet   Oral   Take 20 mg by mouth daily.         ? Allergies Review of patient's allergies indicates no known allergies. ? History reviewed. No pertinent family history. ? Social History History  Substance Use Topics  . Smoking status: Former Research scientist (life sciences)  . Smokeless tobacco: Not on file  . Alcohol Use: No     Comment: occasionally   Review of Systems  Constitutional: Negative for fever. HEENT: Negative for head trauma,  visual changes, sore throat. Cardiovascular: Negative for chest pain. Respiratory: Negative for shortness of breath. Musculoskeletal: Positive for neck & back pain. Genitourinary: Positive for dysuria Skin: Negative for rash. Neurological: Negative for headaches, focal weakness or numbness.  10-point ROS otherwise negative. ____________________________________________  PHYSICAL EXAM:  VITAL SIGNS: ED Triage Vitals  Enc Vitals Group     BP 03/25/15 1258 136/93 mmHg     Pulse Rate 03/25/15 1258 105     Resp 03/25/15 1258 18     Temp 03/25/15 1258 97.8 F (36.6 C)     Temp Source 03/25/15 1258 Oral     SpO2 03/25/15 1258 100 %     Weight 03/25/15 1258 215 lb (97.523 kg)     Height 03/25/15 1258 6' (1.829 m)     Head Cir --      Peak Flow --      Pain Score 03/25/15 1259 8     Pain Loc --      Pain Edu? --      Excl. in [place]? --    Constitutional: Alert and oriented. Well appearing and in no distress. HEENT:Normocephalic and atraumatic.  PERRL. Normal extraocular movements.  No congestion/rhinnorhea. Mucous membranes are moist. Neck: Supple.  No cervical lymphadenopathy. Cardiovascular: Normal rate, regular rhythm. No murmurs, rubs, or gallops. Normal and symmetric distal pulses are present in all extremities.  Respiratory: Normal respiratory effort without tachypnea. Breath sounds are clear and equal bilaterally. No wheezes/rales/rhonchi. Gastrointestinal: Soft and nontender. No distention. No abdominal bruits. There is no CVA tenderness. Musculoskeletal: Nontender with normal range of motion in all extremities. No joint effusions.  No lower extremity tenderness nor edema. Neurologic:  Normal speech and language. CN II-XII grossly intact. No gait instability. Skin:  Skin is warm, dry and intact. No rash noted. Psychiatric: Mood and affect are normal. Patient exhibits appropriate insight and judgment. ___________ RADIOLOGY  Lumbar Spine  IMPRESSION: Negative.  CT Abd/Pelvis  - Stone Protocol  IMPRESSION: 1. Bilateral nephrolithiasis. No hydronephrosis. No ureteral Calculi. On the right the largest calculus is in the lower pole, 3 x 5 mm. On the left the largest calculus is in the upper pole, 5 x 9 mm. 2. No acute findings are evident in the abdomen or pelvis. 3. Diverticulosis. 4. Small fat containing umbilical hernia _____________ PROCEDURES ? Procedure(s) performed: Toradol 60 mg IM ______________________________________________________ INITIAL IMPRESSION / ASSESSMENT AND PLAN / ED COURSE ? Patient notified of radiology results.  Medical management of renal calculi w/o urethral calculi.  Increase fluids, take anti-inflammatory meds as directed.  Follow-up with PMD or urology for continued care.  Pertinent labs & imaging results that were available during my care of the patient were reviewed by me and considered in my medical decision making (see chart for details).  ____________________________________________ FINAL CLINICAL IMPRESSION(S) / ED DIAGNOSES?  Final diagnoses:  Back pain, lumbosacral  Renal calculi  Neck pain, musculoskeletal      Melvenia Needles, PA-C 03/25/15 1826  Nance Pear, MD 03/25/15 1901

## 2015-03-25 NOTE — Discharge Instructions (Signed)
Back Pain, Adult Back pain is very common. The pain often gets better over time. The cause of back pain is usually not dangerous. Most people can learn to manage their back pain on their own.  HOME CARE   Stay active. Start with short walks on flat ground if you can. Try to walk farther each day.  Do not sit, drive, or stand in one place for more than 30 minutes. Do not stay in bed.  Do not avoid exercise or work. Activity can help your back heal faster.  Be careful when you bend or lift an object. Bend at your knees, keep the object close to you, and do not twist.  Sleep on a firm mattress. Lie on your side, and bend your knees. If you lie on your back, put a pillow under your knees.  Only take medicines as told by your doctor.  Put ice on the injured area.  Put ice in a plastic bag.  Place a towel between your skin and the bag.  Leave the ice on for 15-20 minutes, 03-04 times a day for the first 2 to 3 days. After that, you can switch between ice and heat packs.  Ask your doctor about back exercises or massage.  Avoid feeling anxious or stressed. Find good ways to deal with stress, such as exercise. GET HELP RIGHT AWAY IF:   Your pain does not go away with rest or medicine.  Your pain does not go away in 1 week.  You have new problems.  You do not feel well.  The pain spreads into your legs.  You cannot control when you poop (bowel movement) or pee (urinate).  Your arms or legs feel weak or lose feeling (numbness).  You feel sick to your stomach (nauseous) or throw up (vomit).  You have belly (abdominal) pain.  You feel like you may pass out (faint). MAKE SURE YOU:   Understand these instructions.  Will watch your condition.  Will get help right away if you are not doing well or get worse. Document Released: 04/20/2008 Document Revised: 01/25/2012 Document Reviewed: 03/06/2014 [facility] Patient Information 2015 [place], [place]. This information is not intended  to replace advice given to you by your health care provider. Make sure you discuss any questions you have with your health care provider.  Cervical Sprain A cervical sprain is when the tissues (ligaments) that hold the neck bones in place stretch or tear. HOME CARE   Put ice on the injured area.  Put ice in a plastic bag.  Place a towel between your skin and the bag.  Leave the ice on for 15-20 minutes, 3-4 times a day.  You may have been given a collar to wear. This collar keeps your neck from moving while you heal.  Do not take the collar off unless told by your doctor.  If you have long hair, keep it outside of the collar.  Ask your doctor before changing the position of your collar. You may need to change its position over time to make it more comfortable.  If you are allowed to take off the collar for cleaning or bathing, follow your doctor's instructions on how to do it safely.  Keep your collar clean by wiping it with mild soap and water. Dry it completely. If the collar has removable pads, remove them every 1-2 days to hand wash them with soap and water. Allow them to air dry. They should be dry before you wear them in  the collar.  Do not drive while wearing the collar.  Only take medicine as told by your doctor.  Keep all doctor visits as told.  Keep all physical therapy visits as told.  Adjust your work station so that you have good posture while you work.  Avoid positions and activities that make your problems worse.  Warm up and stretch before being active. GET HELP IF:  Your pain is not controlled with medicine.  You cannot take less pain medicine over time as planned.  Your activity level does not improve as expected. GET HELP RIGHT AWAY IF:   You are bleeding.  Your stomach is upset.  You have an allergic reaction to your medicine.  You develop new problems that you cannot explain.  You lose feeling (become numb) or you cannot move any part of  your body (paralysis).  You have tingling or weakness in any part of your body.  Your symptoms get worse. Symptoms include:  Pain, soreness, stiffness, puffiness (swelling), or a burning feeling in your neck.  Pain when your neck is touched.  Shoulder or upper back pain.  Limited ability to move your neck.  Headache.  Dizziness.  Your hands or arms feel week, lose feeling, or tingle.  Muscle spasms.  Difficulty swallowing or chewing. MAKE SURE YOU:   Understand these instructions.  Will watch your condition.  Will get help right away if you are not doing well or get worse. Document Released: 04/20/2008 Document Revised: 07/05/2013 Document Reviewed: 05/10/2013 [facility] Patient Information 2015 [place], [place]. This information is not intended to replace advice given to you by your health care provider. Make sure you discuss any questions you have with your health care provider.  Kidney Stones Kidney stones (urolithiasis) are solid masses that form inside your kidneys. The intense pain is caused by the stone moving through the kidney, ureter, bladder, and urethra (urinary tract). When the stone moves, the ureter starts to spasm around the stone. The stone is usually passed in your pee (urine).  HOME CARE  Drink enough fluids to keep your pee clear or pale yellow. This helps to get the stone out.  Strain all pee through the provided strainer. Do not pee without peeing through the strainer, not even once. If you pee the stone out, catch it in the strainer. The stone may be as small as a grain of salt. Take this to your doctor. This will help your doctor figure out what you can do to try to prevent more kidney stones.  Only take medicine as told by your doctor.  Follow up with your doctor as told.  Get follow-up X-rays as told by your doctor. GET HELP IF: You have pain that gets worse even if you have been taking pain medicine. GET HELP RIGHT AWAY IF:   Your pain does  not get better with medicine.  You have a fever or shaking chills.  Your pain increases and gets worse over 18 hours.  You have new belly (abdominal) pain.  You feel faint or pass out.  You are unable to pee. MAKE SURE YOU:   Understand these instructions.  Will watch your condition.  Will get help right away if you are not doing well or get worse. Document Released: 04/20/2008 Document Revised: 07/05/2013 Document Reviewed: 04/05/2013 [facility] Patient Information 2015 [place], [place]. This information is not intended to replace advice given to you by your health care provider. Make sure you discuss any questions you have with your health  care provider.  Take the prescription meds as directed.  Follow-up with your provider or Urology as needed.

## 2015-03-25 NOTE — ED Notes (Signed)
Pt states he fell on Friday and was seen here for his injuries..states he is having continued neck pain

## 2015-03-25 NOTE — ED Notes (Signed)
States he was seen 2 days ago s/p fall  States he tripped and fell going up steps. Bruising noted to left side of face. states he is now having pain to lower back and neck

## 2015-03-27 IMAGING — CT CT HEAD WITHOUT CONTRAST
4 of 5 series · 15 of 33 positions shown, 17 images · non-contrast
Comparison: None.

CLINICAL DATA: Syncopal episode with residual balance disturbance
and dizziness

EXAM:
CT HEAD WITHOUT CONTRAST
CT CERVICAL SPINE WITHOUT CONTRAST
TECHNIQUE: Multidetector CT imaging of the head and cervical spine was
performed following the standard protocol without intravenous
contrast. Multiplanar CT image reconstructions of the cervical spine
were also generated.

[Series 5: c spine soft · axial · 0.33mm/px · z∈[-212,-142]mm · 3 of 89 slices shown]
[im 18/89  soft-tissue]
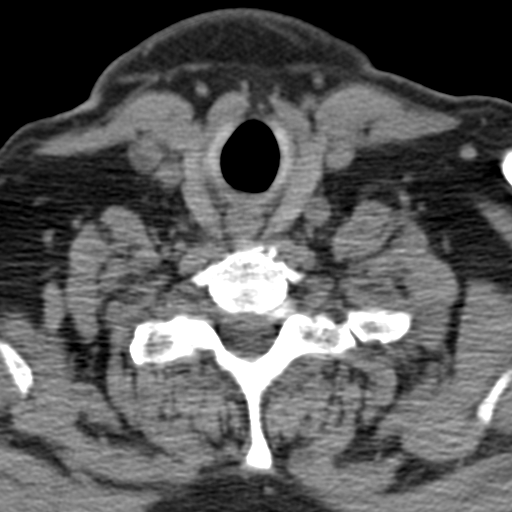
[im 36/89  soft-tissue]
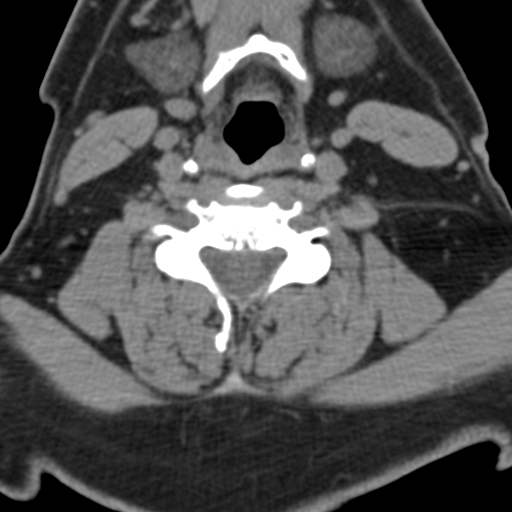
[im 53/89  soft-tissue]
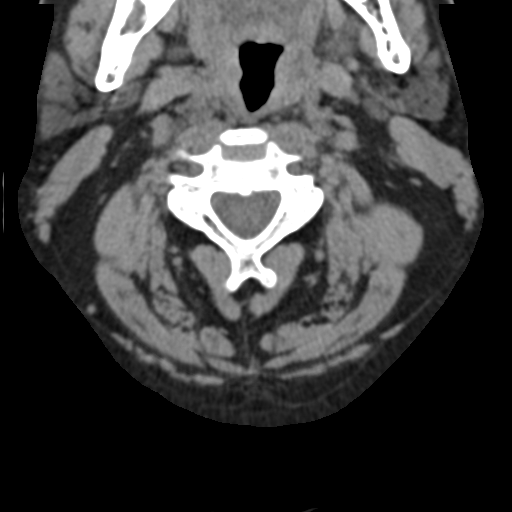

[Series 6: sag bone · sagittal · 0.30mm/px · 5 of 48 slices shown, 6 images]
[im 16/48  bone]
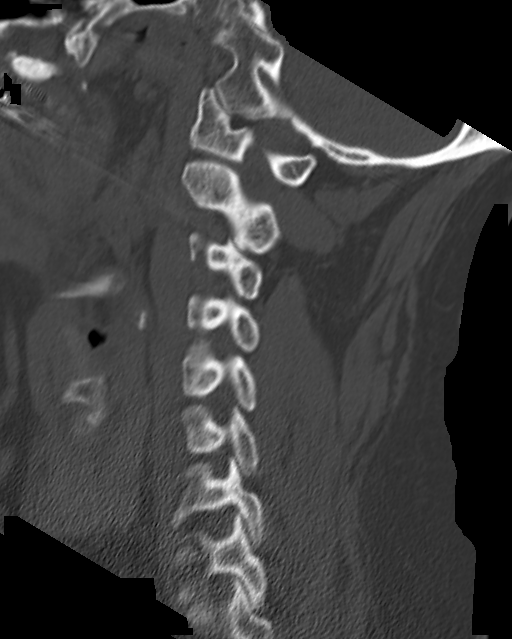
[im 20/48  bone]
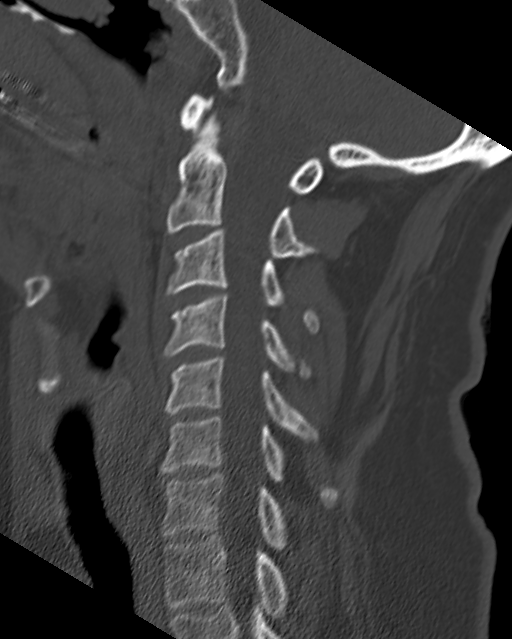
[im 24/48  soft-tissue]
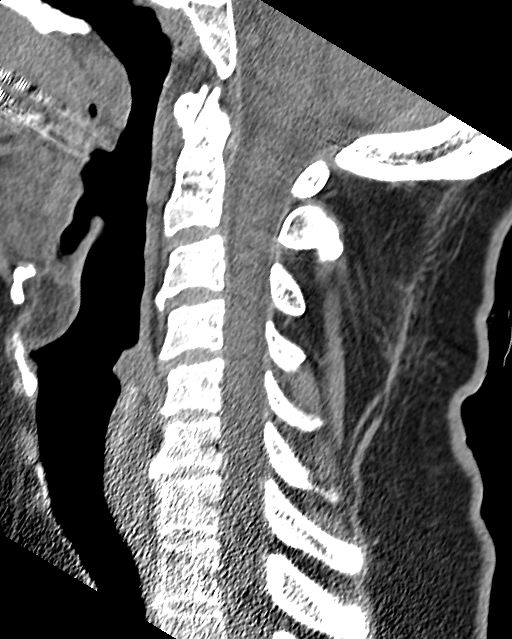
[im 24/48  bone]
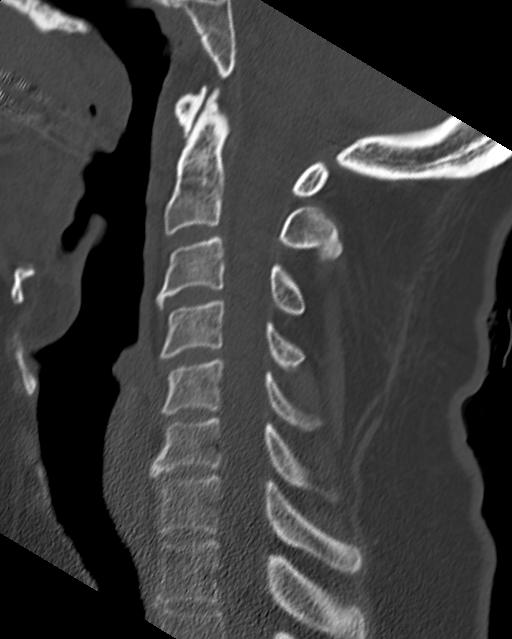
[im 28/48  bone]
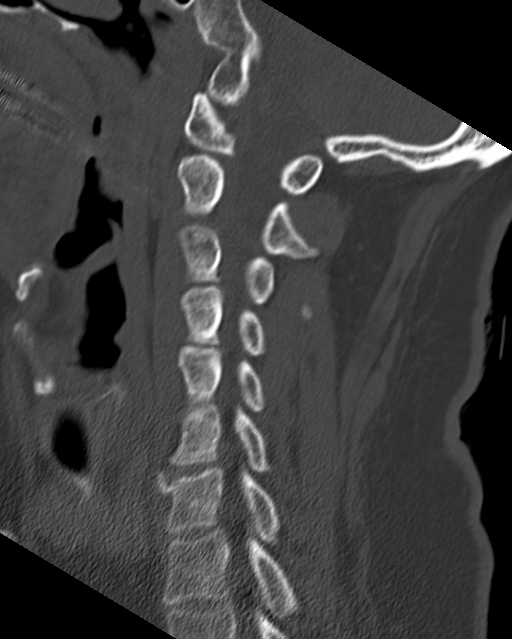
[im 32/48  bone]
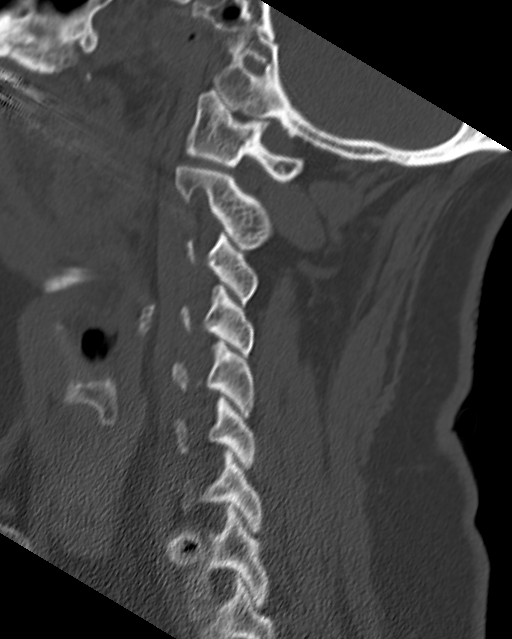

[Series 7: cor bone · coronal · 0.28mm/px · 3 of 45 slices shown]
[im 9/45  bone]
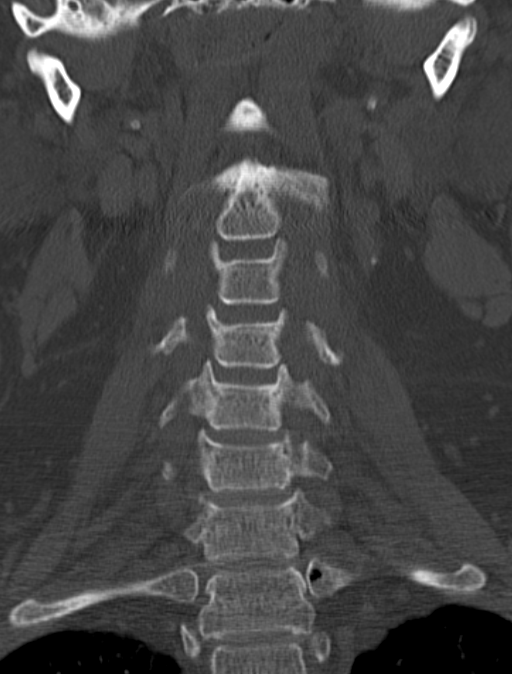
[im 18/45  bone]
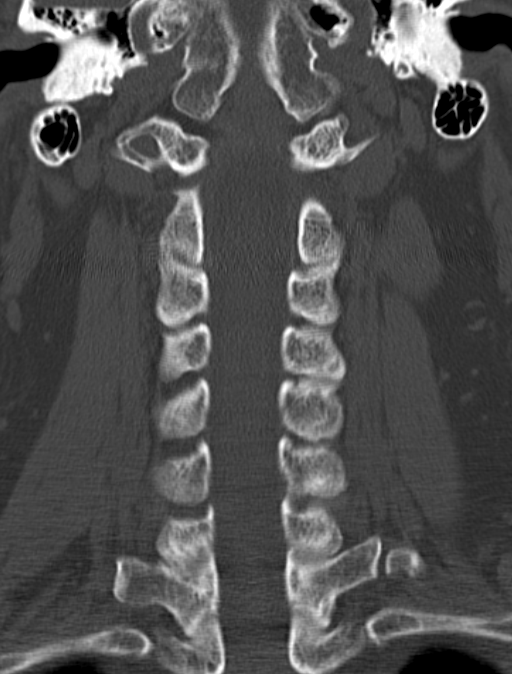
[im 27/45  bone]
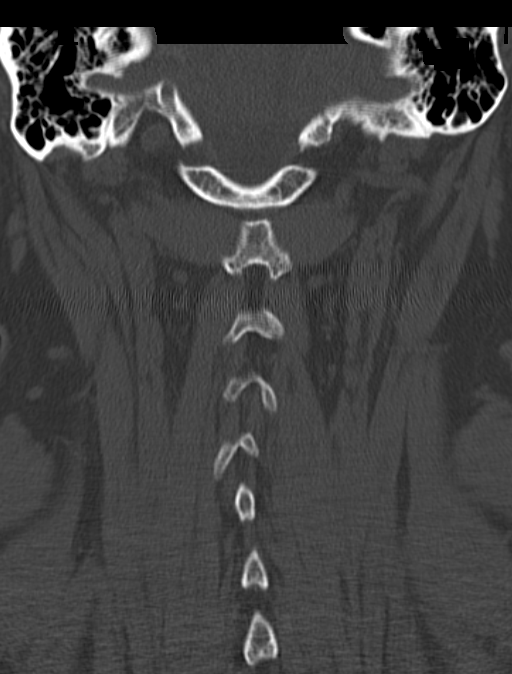

[Series 8: orthogonal axials · axial · 0.29mm/px · z∈[-244,-147]mm · 4 of 93 slices shown, 5 images]
[im 19/93  soft-tissue]
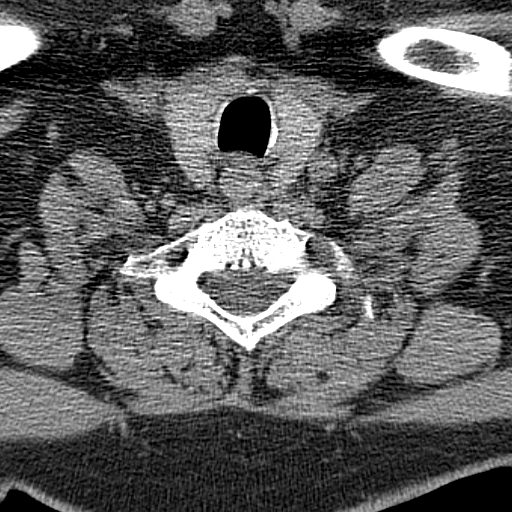
[im 19/93  bone]
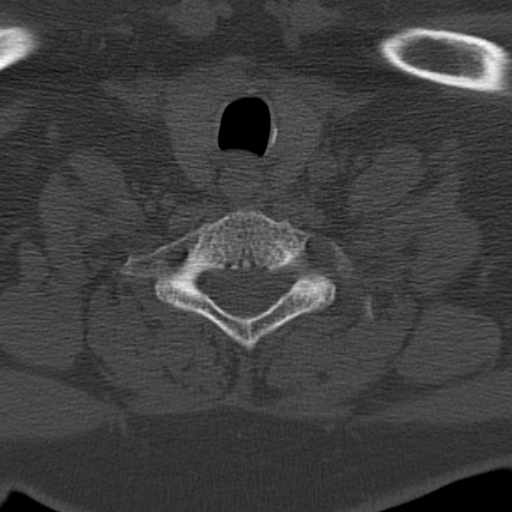
[im 37/93  bone]
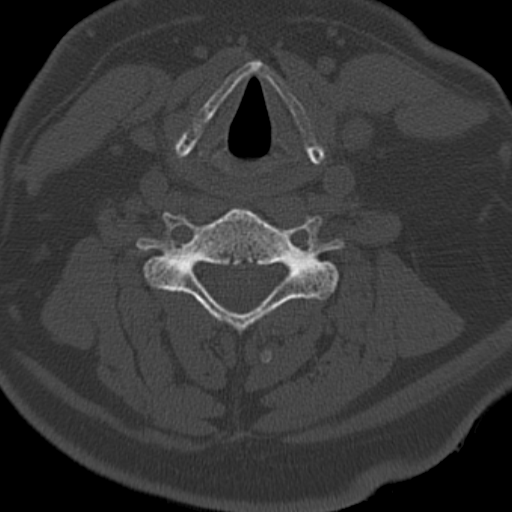
[im 56/93  bone]
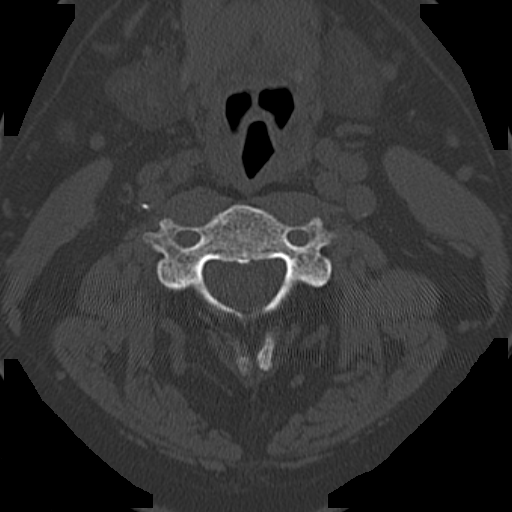
[im 74/93  bone]
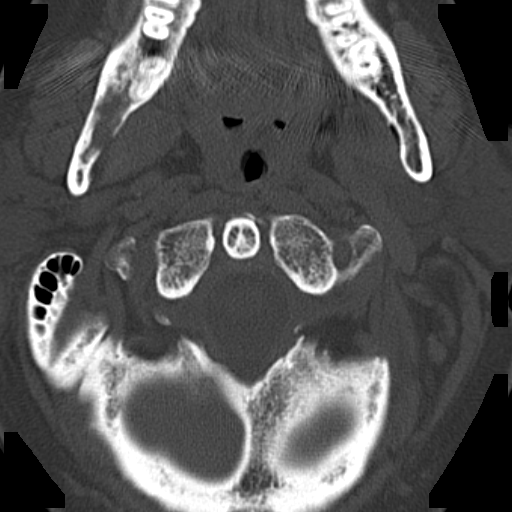

[15 of 33 positions shown; findings below may reference images not displayed]

FINDINGS: CT HEAD FINDINGS

The ventricles are normal in size and position. There is no
intracranial hemorrhage nor intracranial mass effect nor acute
ischemic change. The cerebellum and brainstem are normal.

The observed paranasal sinuses and mastoid air cells exhibit no
air-fluid levels. There is minimal mucoperiosteal thickening of
posterior ethmoid and anterior sphenoid sinus cells. There is no
acute skull fracture nor cephalohematoma.

CT CERVICAL SPINE FINDINGS

There is mild loss of the normal cervical lordosis. The vertebral
bodies are preserved in height. The intervertebral disc space
heights are reasonably well maintained. There is degenerative disc
change with anterior bridging osteophyte formation at C3-4 and at
C6-7. There is no perched facet nor spinous process fracture. The
prevertebral soft tissues are normal. The odontoid is intact. The
pulmonary apices are clear.
IMPRESSION: 1. There is no acute intracranial hemorrhage nor other acute
intracranial abnormality.
2. There is no skull fracture. Mild ethmoid and sphenoid sinus
inflammation is suspected.
3. Loss of the normal cervical lordosis is consistent with muscle
spasm. There is no acute fracture nor dislocation. There is mild
degenerative change at C3-4 and C6-7.

## 2015-03-27 IMAGING — CR DG CHEST 1V PORT
1 series · 1 of 1 positions shown · non-contrast
Comparison: Chest x-ray 06/07/2014.

CLINICAL DATA: Syncope.

EXAM:
PORTABLE CHEST - 1 VIEW

[x chest ap]
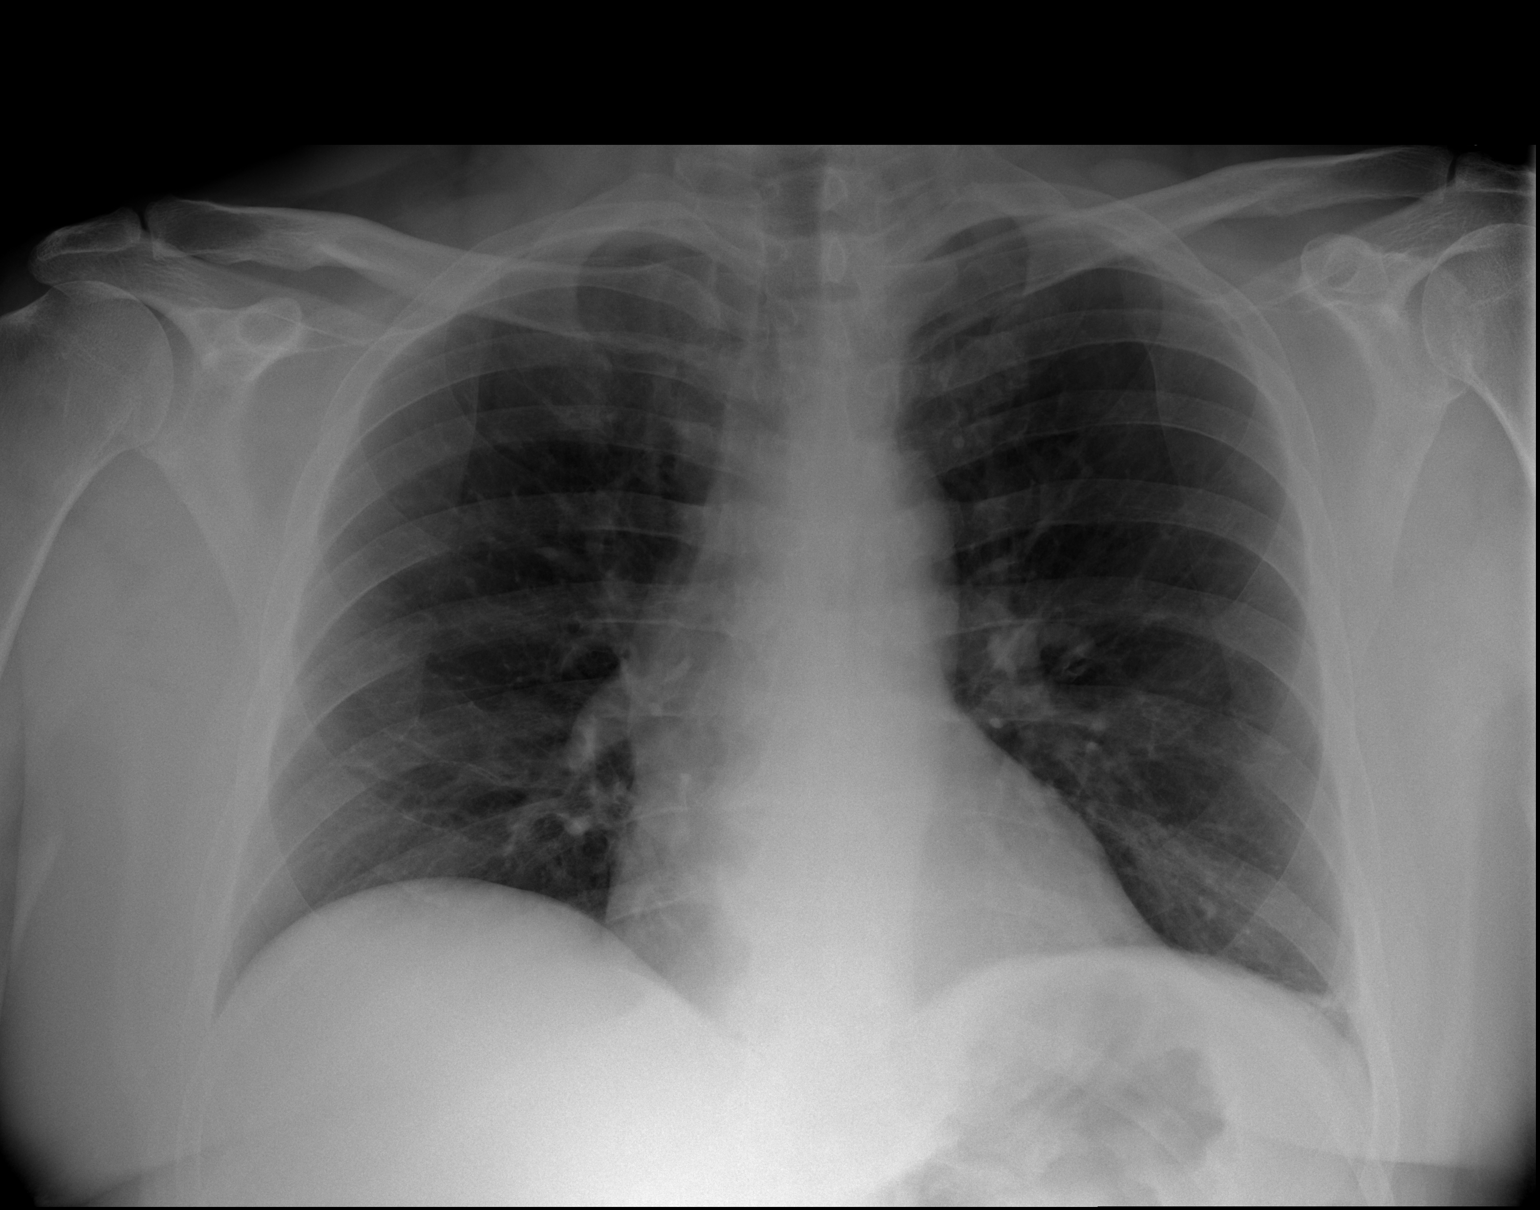

[1 of 1 positions shown; findings below may reference images not displayed]

FINDINGS: Small focus of subsegmental atelectasis in the periphery of the left
lung base. No consolidative airspace disease. No pleural effusions.
Lung volumes are low. No evidence of pulmonary edema. No
pneumothorax. No suspicious appearing pulmonary nodules or masses.
Heart size is normal. Upper mediastinal contours are within normal
limits.
IMPRESSION: 1. Low lung volumes without radiographic evidence of acute
cardiopulmonary disease.

## 2015-03-28 IMAGING — US US CAROTID DUPLEX BILAT
1 series · 13 of 24 positions shown · non-contrast
Comparison: None.

CLINICAL DATA: Syncope, hypertension, visual disturbance

EXAM:
BILATERAL CAROTID DUPLEX ULTRASOUND
TECHNIQUE: Gray scale imaging, color Doppler and duplex ultrasound were
performed of bilateral carotid and vertebral arteries in the neck.

[Series 1: us carotid duplex bilat · 0.07mm/px · 13 of 62 slices shown]
[im 1/62]
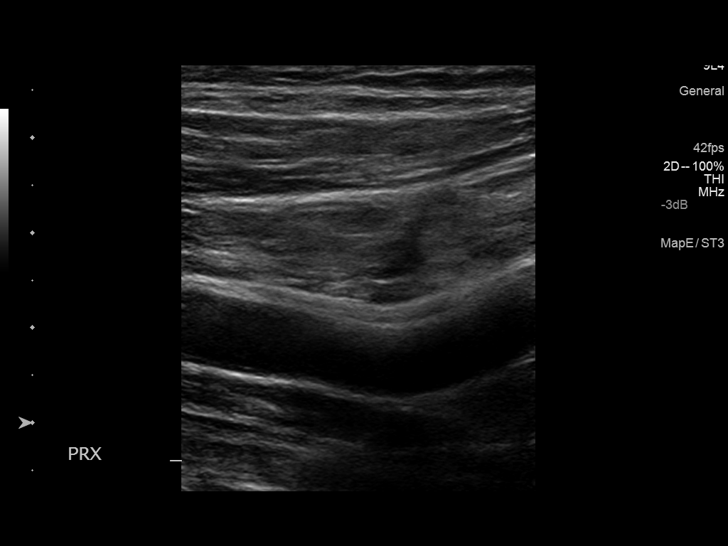
[im 6/62]
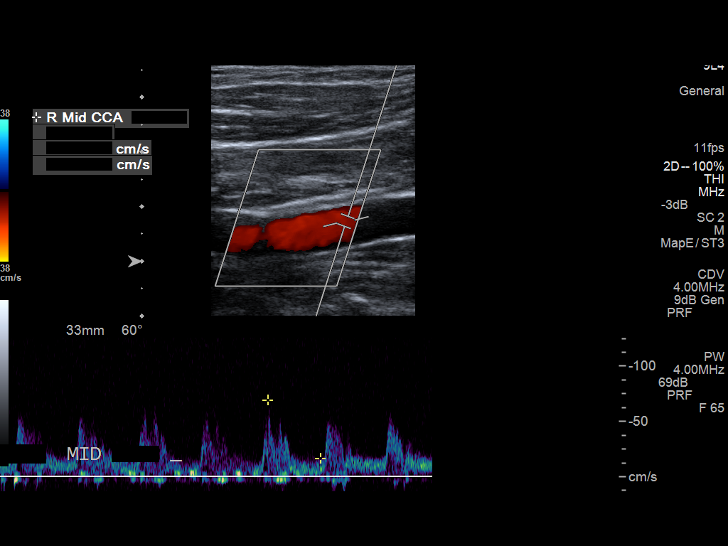
[im 11/62]
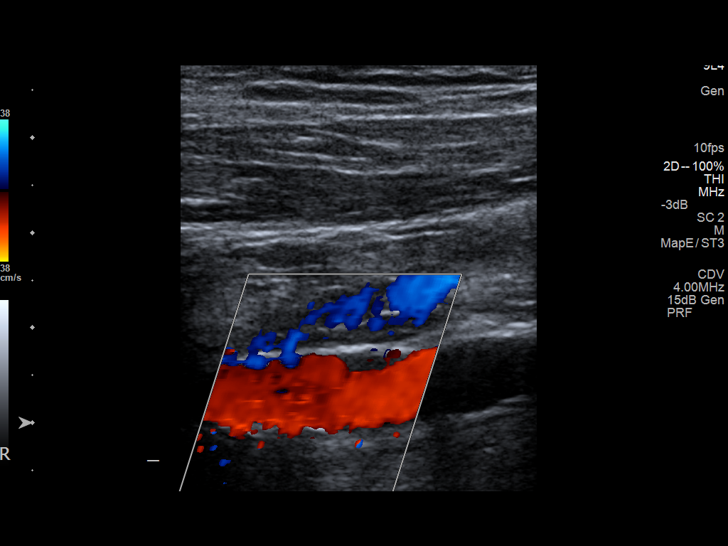
[im 16/62]
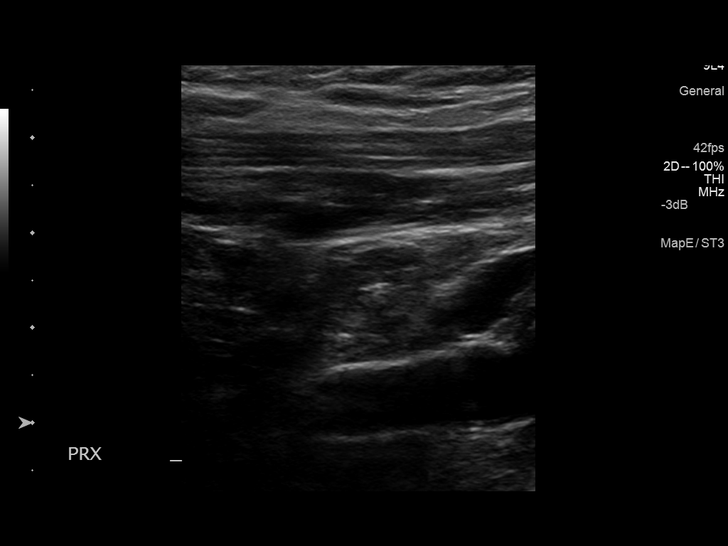
[im 22/62]
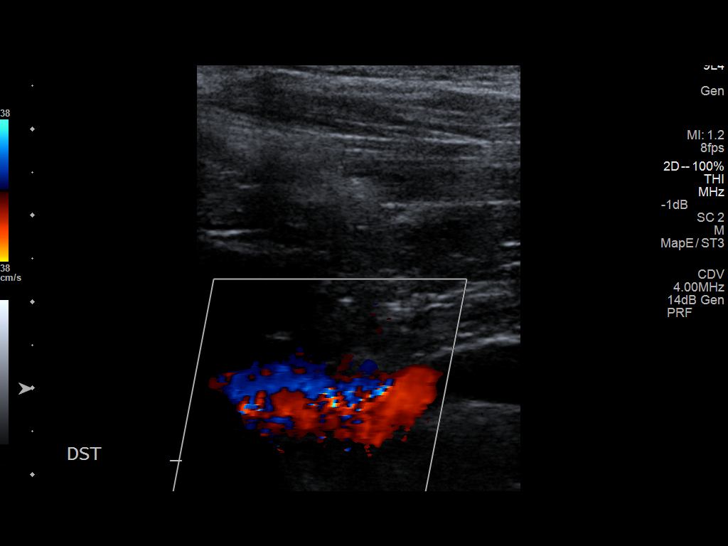
[im 27/62]
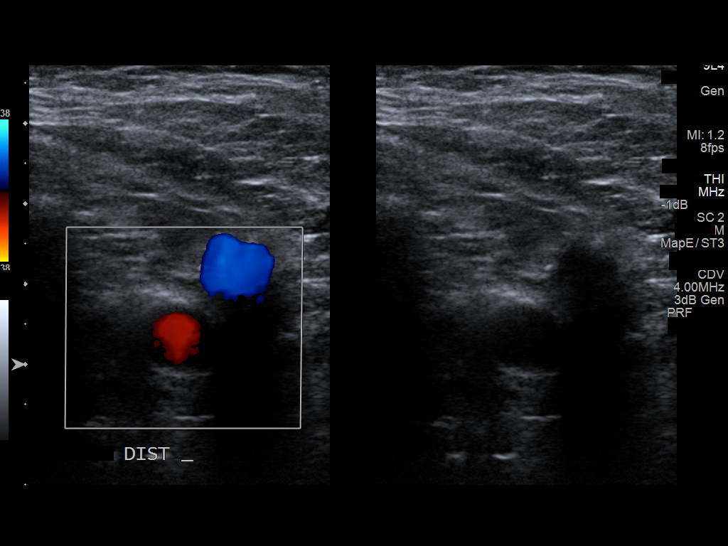
[im 32/62]
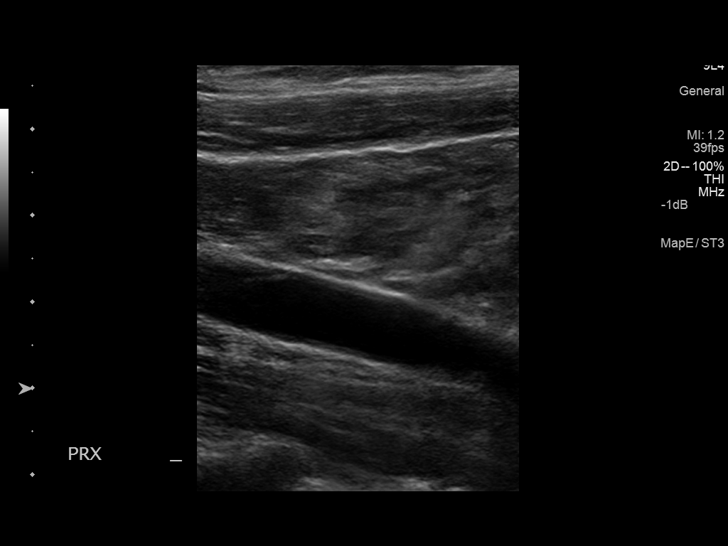
[im 35/62]
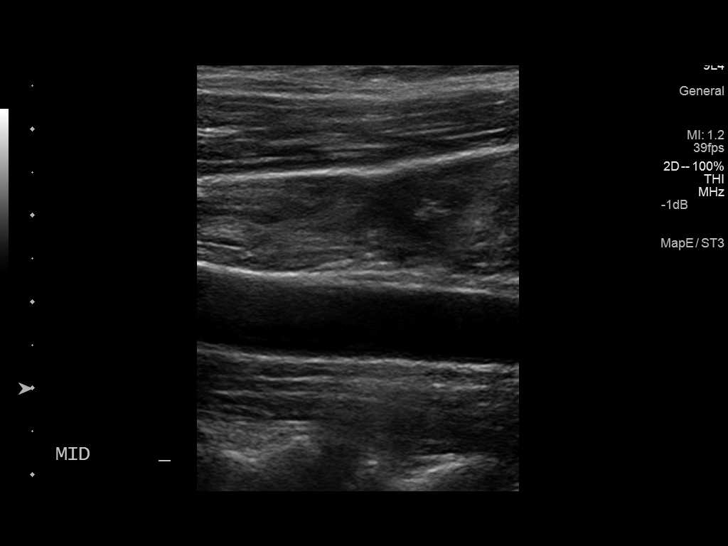
[im 40/62]
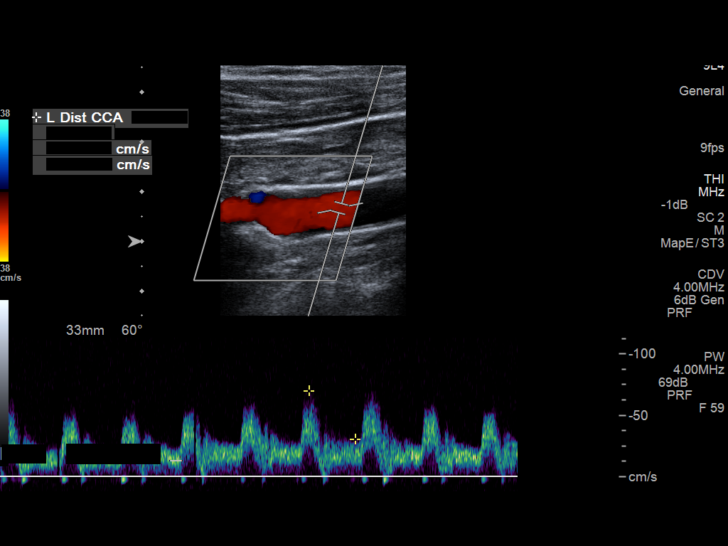
[im 46/62]
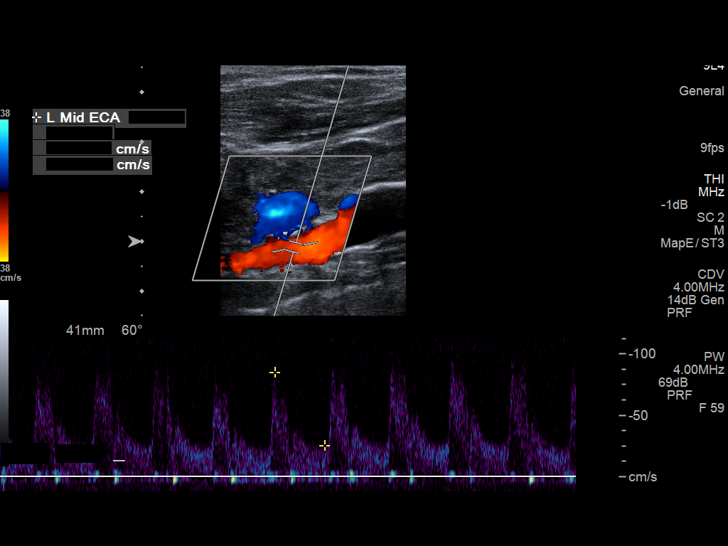
[im 51/62]
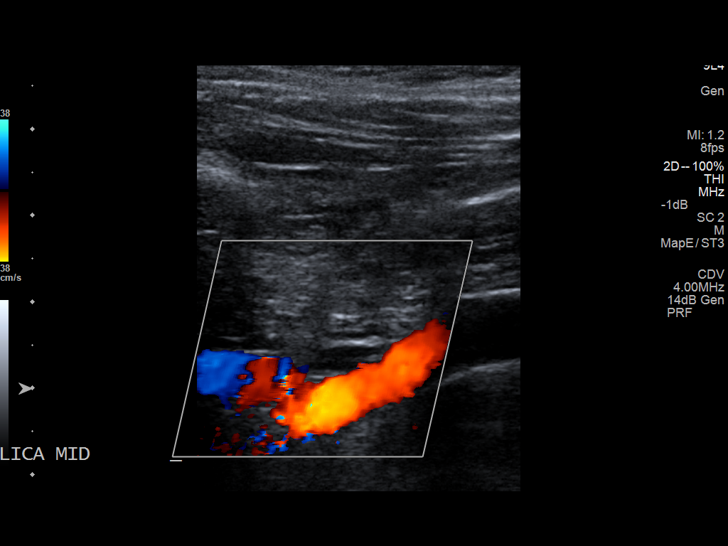
[im 56/62]
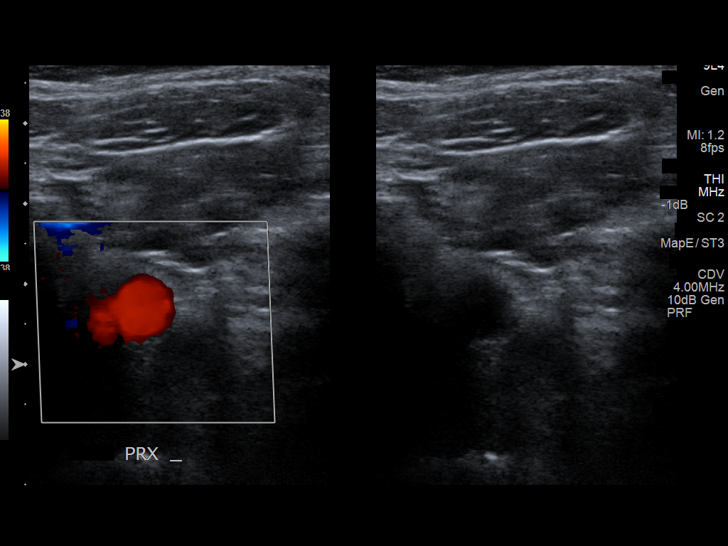
[im 62/62]
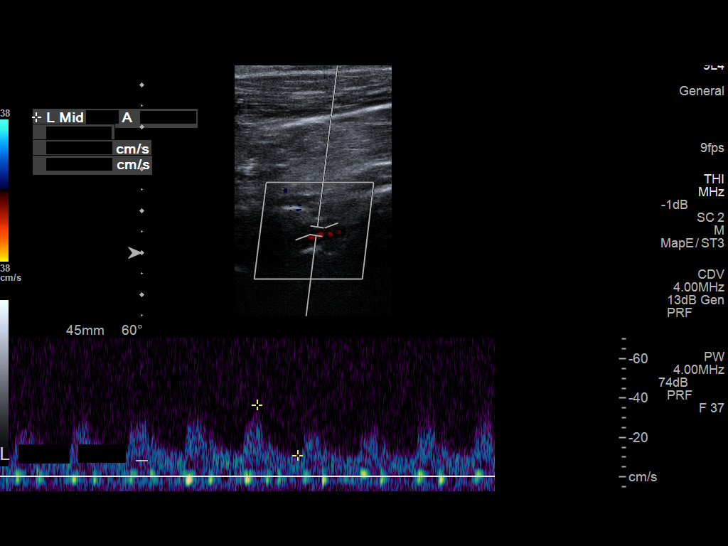

[13 of 24 positions shown; findings below may reference images not displayed]

FINDINGS: Criteria: Quantification of carotid stenosis is based on velocity
parameters that correlate the residual internal carotid diameter
with NASCET-based stenosis levels, using the diameter of the distal
internal carotid lumen as the denominator for stenosis measurement.

The following velocity measurements were obtained:

RIGHT

ICA:  116/49 cm/sec

CCA:  69/17 cm/sec

SYSTOLIC ICA/CCA RATIO:

DIASTOLIC ICA/CCA RATIO:

ECA:  99 cm/sec

LEFT

ICA:  126/55 cm/sec

CCA:  94/35 cm/sec

SYSTOLIC ICA/CCA RATIO:

DIASTOLIC ICA/CCA RATIO:

ECA:  85 cm/sec

RIGHT CAROTID ARTERY: Minor echogenic shadowing plaque formation. No
hemodynamically significant right ICA stenosis, velocity elevation,
or turbulent flow. Degree of narrowing less than 50%.

RIGHT VERTEBRAL ARTERY:  Antegrade

LEFT CAROTID ARTERY: Similar scattered minor echogenic plaque
formation. No hemodynamically significant left ICA stenosis,
velocity elevation, or turbulent flow.

LEFT VERTEBRAL ARTERY:  Antegrade
IMPRESSION: Minor carotid atherosclerosis. No hemodynamically significant ICA
stenosis. Degree of narrowing less than 50% bilaterally.

## 2015-04-23 ENCOUNTER — Other Ambulatory Visit: Payer: Self-pay

## 2015-04-25 ENCOUNTER — Other Ambulatory Visit: Payer: Self-pay

## 2015-05-01 ENCOUNTER — Other Ambulatory Visit: Payer: Self-pay | Admitting: Internal Medicine

## 2015-05-02 ENCOUNTER — Ambulatory Visit: Payer: Self-pay

## 2015-05-02 DIAGNOSIS — M549 Dorsalgia, unspecified: Secondary | ICD-10-CM

## 2015-05-02 DIAGNOSIS — G8929 Other chronic pain: Secondary | ICD-10-CM | POA: Insufficient documentation

## 2015-05-06 ENCOUNTER — Other Ambulatory Visit: Payer: Self-pay | Admitting: Family Medicine

## 2015-05-16 ENCOUNTER — Emergency Department
Admission: EM | Admit: 2015-05-16 | Discharge: 2015-05-16 | Disposition: A | Discharge status: Home / Self Care | Payer: Self-pay | Attending: Emergency Medicine | Admitting: Emergency Medicine

## 2015-05-16 ENCOUNTER — Encounter: Payer: Self-pay | Admitting: Emergency Medicine

## 2015-05-16 ENCOUNTER — Emergency Department: Payer: Self-pay

## 2015-05-16 DIAGNOSIS — R109 Unspecified abdominal pain: Secondary | ICD-10-CM

## 2015-05-16 DIAGNOSIS — I10 Essential (primary) hypertension: Secondary | ICD-10-CM | POA: Insufficient documentation

## 2015-05-16 DIAGNOSIS — N200 Calculus of kidney: Secondary | ICD-10-CM | POA: Insufficient documentation

## 2015-05-16 DIAGNOSIS — Z87891 Personal history of nicotine dependence: Secondary | ICD-10-CM | POA: Insufficient documentation

## 2015-05-16 DIAGNOSIS — Z792 Long term (current) use of antibiotics: Secondary | ICD-10-CM | POA: Insufficient documentation

## 2015-05-16 DIAGNOSIS — N390 Urinary tract infection, site not specified: Secondary | ICD-10-CM | POA: Insufficient documentation

## 2015-05-16 DIAGNOSIS — Z79899 Other long term (current) drug therapy: Secondary | ICD-10-CM | POA: Insufficient documentation

## 2015-05-16 LAB — CBC
HCT: 41.4 % (ref 40.0–52.0)
Hemoglobin: 14.2 g/dL (ref 13.0–18.0)
MCH: 31.6 pg (ref 26.0–34.0)
MCHC: 34.4 g/dL (ref 32.0–36.0)
MCV: 92.1 fL (ref 80.0–100.0)
Platelets: 213 10*3/uL (ref 150–440)
RBC: 4.49 MIL/uL (ref 4.40–5.90)
RDW: 14 % (ref 11.5–14.5)
WBC: 6.8 10*3/uL (ref 3.8–10.6)

## 2015-05-16 LAB — URINALYSIS COMPLETE WITH MICROSCOPIC (ARMC ONLY)
Bilirubin Urine: NEGATIVE
Glucose, UA: NEGATIVE mg/dL
Hgb urine dipstick: NEGATIVE
Ketones, ur: NEGATIVE mg/dL
Nitrite: NEGATIVE
Protein, ur: 30 mg/dL — AB
Specific Gravity, Urine: 1.026 (ref 1.005–1.030)
pH: 5 (ref 5.0–8.0)

## 2015-05-16 LAB — COMPREHENSIVE METABOLIC PANEL
ALT: 31 U/L (ref 17–63)
AST: 26 U/L (ref 15–41)
Albumin: 4 g/dL (ref 3.5–5.0)
Alkaline Phosphatase: 73 U/L (ref 38–126)
Anion gap: 12 (ref 5–15)
BUN: 18 mg/dL (ref 6–20)
CO2: 22 mmol/L (ref 22–32)
Calcium: 9.4 mg/dL (ref 8.9–10.3)
Chloride: 109 mmol/L (ref 101–111)
Creatinine, Ser: 1.2 mg/dL (ref 0.61–1.24)
GFR calc Af Amer: >60 mL/min (ref >60)
GFR calc non Af Amer: >60 mL/min (ref >60)
Glucose, Bld: 145 mg/dL — ABNORMAL HIGH (ref 65–99)
Potassium: 3.7 mmol/L (ref 3.5–5.1)
Sodium: 143 mmol/L (ref 135–145)
Total Bilirubin: 0.5 mg/dL (ref 0.3–1.2)
Total Protein: 6.8 g/dL (ref 6.5–8.1)

## 2015-05-16 MED ORDER — KETOROLAC TROMETHAMINE 30 MG/ML IJ SOLN
30.0000 mg | Freq: Once | INTRAMUSCULAR | Status: AC
  Administered 2015-05-16: 30 mg via INTRAVENOUS

## 2015-05-16 MED ORDER — CIPROFLOXACIN HCL 500 MG PO TABS: 500.0000 mg | ORAL_TABLET | Freq: Two times a day (BID) | ORAL | Status: DC

## 2015-05-16 MED ORDER — CIPROFLOXACIN HCL 500 MG PO TABS
ORAL_TABLET | ORAL | Status: AC
  Administered 2015-05-16: 500 mg via ORAL
  Filled 2015-05-16: qty 1

## 2015-05-16 MED ORDER — SULFAMETHOXAZOLE-TRIMETHOPRIM 800-160 MG PO TABS: 1.0000 | ORAL_TABLET | Freq: Once | ORAL | Status: DC

## 2015-05-16 MED ORDER — CIPROFLOXACIN HCL 500 MG PO TABS
500.0000 mg | ORAL_TABLET | Freq: Once | ORAL | Status: AC
  Administered 2015-05-16: 500 mg via ORAL

## 2015-05-16 MED ORDER — TAMSULOSIN HCL 0.4 MG PO CAPS: 0.4000 mg | ORAL_CAPSULE | Freq: Every day | ORAL | Status: DC

## 2015-05-16 MED ORDER — ONDANSETRON HCL 4 MG/2ML IJ SOLN
INTRAMUSCULAR | Status: AC
  Administered 2015-05-16: 4 mg via INTRAVENOUS
  Filled 2015-05-16: qty 2

## 2015-05-16 MED ORDER — KETOROLAC TROMETHAMINE 30 MG/ML IJ SOLN: 30.0000 mg | Freq: Once | INTRAMUSCULAR | Status: DC

## 2015-05-16 MED ORDER — MORPHINE SULFATE 4 MG/ML IJ SOLN
INTRAMUSCULAR | Status: AC
  Administered 2015-05-16: 4 mg via INTRAVENOUS
  Filled 2015-05-16: qty 1

## 2015-05-16 MED ORDER — KETOROLAC TROMETHAMINE 10 MG PO TABS: 10.0000 mg | ORAL_TABLET | Freq: Three times a day (TID) | ORAL | Status: DC

## 2015-05-16 MED ORDER — KETOROLAC TROMETHAMINE 30 MG/ML IJ SOLN
INTRAMUSCULAR | Status: AC
  Administered 2015-05-16: 30 mg via INTRAVENOUS
  Filled 2015-05-16: qty 1

## 2015-05-16 NOTE — ED Notes (Signed)
Pt. States rt. Flank pain for the past 2 days.  Pt. States he was here early today with the same symptoms.  Pt. States rt. Flank pain that radiates to rt. Lower side of back.  Pt. States hx of kidney stones.

## 2015-05-16 NOTE — ED Notes (Signed)
Pt informed to return if any life threatening symptoms occur.  

## 2015-05-16 NOTE — ED Notes (Signed)
Right flank pain x 2 days, was here last night and dx with kidney stones.

## 2015-05-16 NOTE — ED Notes (Signed)
Report to Romie Minus

## 2015-05-16 NOTE — ED Notes (Signed)
Patient requesting something else for pain. States the morphine made the pain worse. MD notified. Awaiting new orders.

## 2015-05-16 NOTE — ED Provider Notes (Signed)
[facility] Emergency Department Provider Note  ____________________________________________  Time seen: 5:30 AM  I have reviewed the triage vital signs and the nursing notes.   HISTORY  Chief Complaint Flank Pain      HPI Miguel [name] is a 54 y.o. male presents with right flank pain with onset yesterday currently 10 out of 10 pain that is nonradiating patient denies any dysuria no nausea no vomiting or diarrhea.     Past Medical History  Diagnosis Date  . Hypertension   . Gout     There are no active problems to display for this patient.   Past Surgical History  Procedure Laterality Date  . Cholecystectomy      Current Outpatient Rx  Name  Route  Sig  Dispense  Refill  . allopurinol (ZYLOPRIM) 100 MG tablet   Oral   Take 100 mg by mouth daily.         . cyclobenzaprine (FLEXERIL) 5 MG tablet   Oral   Take 1 tablet (5 mg total) by mouth 3 (three) times daily as needed for muscle spasms.   15 tablet   0   . esomeprazole (NEXIUM) 40 MG capsule   Oral   Take 40 mg by mouth daily at 12 noon.         Marland Kitchen ketorolac (TORADOL) 10 MG tablet   Oral   Take 1 tablet (10 mg total) by mouth every 8 (eight) hours.   15 tablet   0   . lisinopril (PRINIVIL,ZESTRIL) 20 MG tablet   Oral   Take 20 mg by mouth daily.           Allergies Review of patient's allergies indicates no known allergies.  No family history on file.  Social History History  Substance Use Topics  . Smoking status: Former Research scientist (life sciences)  . Smokeless tobacco: Not on file  . Alcohol Use: No     Comment: occasionally    Review of Systems  Constitutional: Negative for fever. Eyes: Negative for visual changes. ENT: Negative for sore throat. Cardiovascular: Negative for chest pain. Respiratory: Negative for shortness of breath. Gastrointestinal: Negative for abdominal pain, vomiting and diarrhea. Genitourinary: Negative for dysuria. Musculoskeletal: Positive for back  pain. Skin: Negative for rash. Neurological: Negative for headaches, focal weakness or numbness.   10-point ROS otherwise negative.  ____________________________________________   PHYSICAL EXAM:  VITAL SIGNS: ED Triage Vitals  Enc Vitals Group     BP 05/16/15 0254 176/102 mmHg     Pulse Rate 05/16/15 0254 110     Resp 05/16/15 0254 18     Temp 05/16/15 0254 98.1 F (36.7 C)     Temp Source 05/16/15 0254 Oral     SpO2 05/16/15 0254 95 %     Weight 05/16/15 0254 210 lb (95.255 kg)     Height 05/16/15 0254 6\' 1"  (1.854 m)     Head Cir --      Peak Flow --      Pain Score 05/16/15 0255 9     Pain Loc --      Pain Edu? --      Excl. in [place]? --      Constitutional: Alert and oriented. Well appearing and in no distress. Eyes: Conjunctivae are normal. PERRL. Normal extraocular movements. ENT   Head: Normocephalic and atraumatic.   Nose: No congestion/rhinnorhea.   Mouth/Throat: Mucous membranes are moist.   Neck: No stridor. Hematological/Lymphatic/Immunilogical: No cervical lymphadenopathy. Cardiovascular: Normal rate, regular rhythm.  Normal and symmetric distal pulses are present in all extremities. No murmurs, rubs, or gallops. Respiratory: Normal respiratory effort without tachypnea nor retractions. Breath sounds are clear and equal bilaterally. No wheezes/rales/rhonchi. Gastrointestinal: Soft and nontender. No distention. There is no CVA tenderness. Genitourinary: deferred Musculoskeletal: Nontender with normal range of motion in all extremities. No joint effusions.  No lower extremity tenderness nor edema. Neurologic:  Normal speech and language. No gross focal neurologic deficits are appreciated. Speech is normal.  Skin:  Skin is warm, dry and intact. No rash noted. Psychiatric: Mood and affect are normal. Speech and behavior are normal. Patient exhibits appropriate insight and judgment.  ____________________________________________    LABS (pertinent  positives/negatives)  Labs Reviewed  COMPREHENSIVE METABOLIC PANEL - Abnormal; Notable for the following:    Glucose, Bld 145 (*)    All other components within normal limits  URINALYSIS COMPLETEWITH MICROSCOPIC ([facility] ONLY) - Abnormal; Notable for the following:    Color, Urine YELLOW (*)    APPearance HAZY (*)    Protein, ur 30 (*)    Leukocytes, UA 3+ (*)    Bacteria, UA FEW (*)    Squamous Epithelial / LPF 0-5 (*)    All other components within normal limits  CBC      RADIOLOGY  CT abdomen and pelvis: IMPRESSION: 1. Bilateral nephrolithiasis. 2. Diverticulosis. 3. No acute findings are evident in the abdomen or pelvis.  ____________________________________________     INITIAL IMPRESSION / ASSESSMENT AND PLAN / ED COURSE  Pertinent labs & imaging results that were available during my care of the patient were reviewed by me and considered in my medical decision making (see chart for details).  CT scan results findings discussed with Dr. Erlene Quan who recommended outpatient follow-up for nonemergent nephrolithiasis. Patient has no evidence of ureterolithiasis at this time. Patient does however evidence of urinary tract infection for which she will be prescribed appropriate antibiotic therapy  ____________________________________________   FINAL CLINICAL IMPRESSION(S) / ED DIAGNOSES  Final diagnoses:  Acute flank pain  Kidney stone  UTI (lower urinary tract infection)      Gregor Hams, MD 05/16/15 [phone]

## 2015-05-16 NOTE — ED Provider Notes (Signed)
[facility] Emergency Department Provider Note    ____________________________________________  Time seen: 2040  I have reviewed the triage vital signs and the nursing notes.   HISTORY  Chief Complaint Flank Pain   History limited by: Not Limited   HPI Miguel [name] is a 54 y.o. male who presents to the emergency department for further instructions about kidney stone care. Patient was seen earlier in the day and discharged with kidney stones. He states that his time of discharge was around shift change and thus he was very confused as to what to do. He states no one came and talked with him. He states he was not happy with his care at that time. He states the pain is been saying. He only took one dose of this pain medication roughly 12 hours ago. Has not taken any further doses. He does not have any new symptoms.     Past Medical History  Diagnosis Date  . Hypertension   . Gout     There are no active problems to display for this patient.   Past Surgical History  Procedure Laterality Date  . Cholecystectomy      Current Outpatient Rx  Name  Route  Sig  Dispense  Refill  . allopurinol (ZYLOPRIM) 100 MG tablet   Oral   Take 100 mg by mouth daily.         . ciprofloxacin (CIPRO) 500 MG tablet   Oral   Take 1 tablet (500 mg total) by mouth 2 (two) times daily.   10 tablet   0   . cyclobenzaprine (FLEXERIL) 5 MG tablet   Oral   Take 1 tablet (5 mg total) by mouth 3 (three) times daily as needed for muscle spasms.   15 tablet   0   . esomeprazole (NEXIUM) 40 MG capsule   Oral   Take 40 mg by mouth daily at 12 noon.         Marland Kitchen ketorolac (TORADOL) 10 MG tablet   Oral   Take 1 tablet (10 mg total) by mouth every 8 (eight) hours.   15 tablet   0   . lisinopril (PRINIVIL,ZESTRIL) 20 MG tablet   Oral   Take 20 mg by mouth daily.           Allergies Review of patient's allergies indicates no known allergies.  No family history  on file.  Social History History  Substance Use Topics  . Smoking status: Former Research scientist (life sciences)  . Smokeless tobacco: Not on file  . Alcohol Use: No     Comment: occasionally    Review of Systems  Constitutional: Negative for fever. Cardiovascular: Negative for chest pain. Respiratory: Negative for shortness of breath. Gastrointestinal: Right flank pain Genitourinary: Negative for dysuria. Musculoskeletal: Negative for back pain. Skin: Negative for rash. Neurological: Negative for headaches, focal weakness or numbness.   10-point ROS otherwise negative.  ____________________________________________   PHYSICAL EXAM:  VITAL SIGNS: ED Triage Vitals  Enc Vitals Group     BP 05/16/15 2025 170/120 mmHg     Pulse Rate 05/16/15 2025 111     Resp 05/16/15 2025 18     Temp 05/16/15 2025 98.2 F (36.8 C)     Temp src --      SpO2 05/16/15 2025 95 %     Weight 05/16/15 2025 210 lb (95.255 kg)     Height 05/16/15 2025 6\' 1"  (1.854 m)     Head Cir --  Peak Flow --      Pain Score 05/16/15 2026 9   Constitutional: Alert and oriented. Well appearing and in no distress. Eyes: Conjunctivae are normal. PERRL. Normal extraocular movements. ENT   Head: Normocephalic and atraumatic.   Nose: No congestion/rhinnorhea.   Mouth/Throat: Mucous membranes are moist.   Neck: No stridor. Hematological/Lymphatic/Immunilogical: No cervical lymphadenopathy. Cardiovascular: Normal rate, regular rhythm.  No murmurs, rubs, or gallops. Respiratory: Normal respiratory effort without tachypnea nor retractions. Breath sounds are clear and equal bilaterally. No wheezes/rales/rhonchi. Gastrointestinal: Soft and nontender. No distention. There is no CVA tenderness. Genitourinary: Deferred Musculoskeletal: Normal range of motion in all extremities. No joint effusions.  No lower extremity tenderness nor edema. Neurologic:  Normal speech and language. No gross focal neurologic deficits are  appreciated. Speech is normal.  Skin:  Skin is warm, dry and intact. No rash noted. Psychiatric: Mood and affect are normal. Speech and behavior are normal. Patient exhibits appropriate insight and judgment.  ____________________________________________    LABS (pertinent positives/negatives)  None  ____________________________________________   EKG  None  ____________________________________________    RADIOLOGY  None  ____________________________________________   PROCEDURES  Procedure(s) performed: None  Critical Care performed: No  ____________________________________________   INITIAL IMPRESSION / ASSESSMENT AND PLAN / ED COURSE  Pertinent labs & imaging results that were available during my care of the patient were reviewed by me and considered in my medical decision making (see chart for details).  Patient presents to the emergency department apparently for further information about kidney stones. He does not have any new complaints since he was last seen. I did sit down and talk to patient about care and medications for kidney stones. Discussed with patient that he should follow-up with urology.  ____________________________________________   FINAL CLINICAL IMPRESSION(S) / ED DIAGNOSES  Final diagnoses:  Kidney stone     Nance Pear, MD 05/16/15 2121

## 2015-05-16 NOTE — ED Notes (Signed)
Patient presents with right flank pain that has been going on since yesterday. States he has intermittent pain and when it's at it worst, it's "really bad." States nausea without vomiting. States it hurts worse when he takes a deep breath. No pain with palpation over kidneys, no pain with palpation over right upper and lower quadrants.

## 2015-05-16 NOTE — Discharge Instructions (Signed)
Please continue the antibiotic and pain medication that were prescribed to her earlier today. Please seek medical attention for any high fevers, chest pain, shortness of breath, change in behavior, persistent vomiting, bloody stool or any other new or concerning symptoms.  Kidney Stones Kidney stones (urolithiasis) are deposits that form inside your kidneys. The intense pain is caused by the stone moving through the urinary tract. When the stone moves, the ureter goes into spasm around the stone. The stone is usually passed in the urine.  CAUSES   A disorder that makes certain neck glands produce too much parathyroid hormone (primary hyperparathyroidism).  A buildup of uric acid crystals, similar to gout in your joints.  Narrowing (stricture) of the ureter.  A kidney obstruction present at birth (congenital obstruction).  Previous surgery on the kidney or ureters.  Numerous kidney infections. SYMPTOMS   Feeling sick to your stomach (nauseous).  Throwing up (vomiting).  Blood in the urine (hematuria).  Pain that usually spreads (radiates) to the groin.  Frequency or urgency of urination. DIAGNOSIS   Taking a history and physical exam.  Blood or urine tests.  CT scan.  Occasionally, an examination of the inside of the urinary bladder (cystoscopy) is performed. TREATMENT   Observation.  Increasing your fluid intake.  Extracorporeal shock wave lithotripsy--This is a noninvasive procedure that uses shock waves to break up kidney stones.  Surgery may be needed if you have severe pain or persistent obstruction. There are various surgical procedures. Most of the procedures are performed with the use of small instruments. Only small incisions are needed to accommodate these instruments, so recovery time is minimized. The size, location, and chemical composition are all important variables that will determine the proper choice of action for you. Talk to your health care provider  to better understand your situation so that you will minimize the risk of injury to yourself and your kidney.  HOME CARE INSTRUCTIONS   Drink enough water and fluids to keep your urine clear or pale yellow. This will help you to pass the stone or stone fragments.  Strain all urine through the provided strainer. Keep all particulate matter and stones for your health care provider to see. The stone causing the pain may be as small as a grain of salt. It is very important to use the strainer each and every time you pass your urine. The collection of your stone will allow your health care provider to analyze it and verify that a stone has actually passed. The stone analysis will often identify what you can do to reduce the incidence of recurrences.  Only take over-the-counter or prescription medicines for pain, discomfort, or fever as directed by your health care provider.  Make a follow-up appointment with your health care provider as directed.  Get follow-up X-rays if required. The absence of pain does not always mean that the stone has passed. It may have only stopped moving. If the urine remains completely obstructed, it can cause loss of kidney function or even complete destruction of the kidney. It is your responsibility to make sure X-rays and follow-ups are completed. Ultrasounds of the kidney can show blockages and the status of the kidney. Ultrasounds are not associated with any radiation and can be performed easily in a matter of minutes. SEEK MEDICAL CARE IF:  You experience pain that is progressive and unresponsive to any pain medicine you have been prescribed. SEEK IMMEDIATE MEDICAL CARE IF:   Pain cannot be controlled with the prescribed medicine.  You have a fever or shaking chills.  The severity or intensity of pain increases over 18 hours and is not relieved by pain medicine.  You develop a new onset of abdominal pain.  You feel faint or pass out.  You are unable to  urinate. MAKE SURE YOU:   Understand these instructions.  Will watch your condition.  Will get help right away if you are not doing well or get worse. Document Released: 11/02/2005 Document Revised: 07/05/2013 Document Reviewed: 04/05/2013 [facility] Patient Information 2015 [place], [place]. This information is not intended to replace advice given to you by your health care provider. Make sure you discuss any questions you have with your health care provider.

## 2015-05-16 NOTE — ED Notes (Signed)
Patient ambulatory to triage with steady gait, without difficulty or distress noted; pt reports lower back/lower abd pain since yesterday; denies any accomp symptoms; st hx of same and dx with kidney stone

## 2015-05-16 NOTE — Discharge Instructions (Signed)
Flank Pain Flank pain refers to pain that is located on the side of the body between the upper abdomen and the back. The pain may occur over a short period of time (acute) or may be long-term or reoccurring (chronic). It may be mild or severe. Flank pain can be caused by many things. CAUSES  Some of the more common causes of flank pain include:  Muscle strains.   Muscle spasms.   A disease of your spine (vertebral disk disease).   A lung infection (pneumonia).   Fluid around your lungs (pulmonary edema).   A kidney infection.   Kidney stones.   A very painful skin rash caused by the chickenpox virus (shingles).   Gallbladder disease.  [place] care will depend on the cause of your pain. In general,  Rest as directed by your caregiver.  Drink enough fluids to keep your urine clear or pale yellow.  Only take over-the-counter or prescription medicines as directed by your caregiver. Some medicines may help relieve the pain.  Tell your caregiver about any changes in your pain.  Follow up with your caregiver as directed. SEEK IMMEDIATE MEDICAL CARE IF:   Your pain is not controlled with medicine.   You have new or worsening symptoms.  Your pain increases.   You have abdominal pain.   You have shortness of breath.   You have persistent nausea or vomiting.   You have swelling in your abdomen.   You feel faint or pass out.   You have blood in your urine.  You have a fever or persistent symptoms for more than 2-3 days.  You have a fever and your symptoms suddenly get worse. MAKE SURE YOU:   Understand these instructions.  Will watch your condition.  Will get help right away if you are not doing well or get worse. Document Released: 12/24/2005 Document Revised: 07/27/2012 Document Reviewed: 06/16/2012 [facility] Patient Information 2015 [place], [place]. This information is not intended to replace advice given to you by your  health care provider. Make sure you discuss any questions you have with your health care provider.  Kidney Stones Kidney stones (urolithiasis) are solid masses that form inside your kidneys. The intense pain is caused by the stone moving through the kidney, ureter, bladder, and urethra (urinary tract). When the stone moves, the ureter starts to spasm around the stone. The stone is usually passed in your pee (urine).  HOME CARE  Drink enough fluids to keep your pee clear or pale yellow. This helps to get the stone out.  Strain all pee through the provided strainer. Do not pee without peeing through the strainer, not even once. If you pee the stone out, catch it in the strainer. The stone may be as small as a grain of salt. Take this to your doctor. This will help your doctor figure out what you can do to try to prevent more kidney stones.  Only take medicine as told by your doctor.  Follow up with your doctor as told.  Get follow-up X-rays as told by your doctor. GET HELP IF: You have pain that gets worse even if you have been taking pain medicine. GET HELP RIGHT AWAY IF:   Your pain does not get better with medicine.  You have a fever or shaking chills.  Your pain increases and gets worse over 18 hours.  You have new belly (abdominal) pain.  You feel faint or pass out.  You are unable to pee.  MAKE SURE YOU:   Understand these instructions.  Will watch your condition.  Will get help right away if you are not doing well or get worse. Document Released: 04/20/2008 Document Revised: 07/05/2013 Document Reviewed: 04/05/2013 [facility] Patient Information 2015 [place], [place]. This information is not intended to replace advice given to you by your health care provider. Make sure you discuss any questions you have with your health care provider.

## 2015-06-07 ENCOUNTER — Other Ambulatory Visit: Payer: Self-pay | Admitting: Urology

## 2015-06-07 DIAGNOSIS — R911 Solitary pulmonary nodule: Secondary | ICD-10-CM

## 2015-06-10 ENCOUNTER — Ambulatory Visit
Admission: RE | Admit: 2015-06-10 | Discharge: 2015-06-10 | Disposition: A | Discharge status: Home / Self Care | Payer: Self-pay | Source: Ambulatory Visit | Attending: Urology | Admitting: Urology

## 2015-06-10 DIAGNOSIS — K760 Fatty (change of) liver, not elsewhere classified: Secondary | ICD-10-CM | POA: Insufficient documentation

## 2015-06-10 DIAGNOSIS — R918 Other nonspecific abnormal finding of lung field: Secondary | ICD-10-CM | POA: Insufficient documentation

## 2015-06-10 DIAGNOSIS — N200 Calculus of kidney: Secondary | ICD-10-CM | POA: Insufficient documentation

## 2015-06-11 ENCOUNTER — Emergency Department
Admission: EM | Admit: 2015-06-11 | Discharge: 2015-06-12 | Disposition: A | Discharge status: Home / Self Care | Payer: Self-pay | Attending: Emergency Medicine | Admitting: Emergency Medicine

## 2015-06-11 ENCOUNTER — Encounter: Payer: Self-pay | Admitting: *Deleted

## 2015-06-11 ENCOUNTER — Other Ambulatory Visit: Payer: Self-pay

## 2015-06-11 DIAGNOSIS — Z87891 Personal history of nicotine dependence: Secondary | ICD-10-CM | POA: Insufficient documentation

## 2015-06-11 DIAGNOSIS — Y9389 Activity, other specified: Secondary | ICD-10-CM | POA: Insufficient documentation

## 2015-06-11 DIAGNOSIS — N390 Urinary tract infection, site not specified: Secondary | ICD-10-CM | POA: Insufficient documentation

## 2015-06-11 DIAGNOSIS — I10 Essential (primary) hypertension: Secondary | ICD-10-CM | POA: Insufficient documentation

## 2015-06-11 DIAGNOSIS — R55 Syncope and collapse: Secondary | ICD-10-CM | POA: Insufficient documentation

## 2015-06-11 DIAGNOSIS — W1839XA Other fall on same level, initial encounter: Secondary | ICD-10-CM | POA: Insufficient documentation

## 2015-06-11 DIAGNOSIS — S92251A Displaced fracture of navicular [scaphoid] of right foot, initial encounter for closed fracture: Secondary | ICD-10-CM | POA: Insufficient documentation

## 2015-06-11 DIAGNOSIS — Y998 Other external cause status: Secondary | ICD-10-CM | POA: Insufficient documentation

## 2015-06-11 DIAGNOSIS — Y9289 Other specified places as the place of occurrence of the external cause: Secondary | ICD-10-CM | POA: Insufficient documentation

## 2015-06-11 DIAGNOSIS — Z79899 Other long term (current) drug therapy: Secondary | ICD-10-CM | POA: Insufficient documentation

## 2015-06-11 DIAGNOSIS — S92901A Unspecified fracture of right foot, initial encounter for closed fracture: Secondary | ICD-10-CM

## 2015-06-11 MED ORDER — SODIUM CHLORIDE 0.9 % IV BOLUS (SEPSIS)
1000.0000 mL | Freq: Once | INTRAVENOUS | Status: AC
  Administered 2015-06-11: 1000 mL via INTRAVENOUS

## 2015-06-11 MED ORDER — IBUPROFEN 800 MG PO TABS
800.0000 mg | ORAL_TABLET | ORAL | Status: AC
  Administered 2015-06-12: 800 mg via ORAL
  Filled 2015-06-11: qty 1

## 2015-06-11 NOTE — ED Provider Notes (Signed)
[facility] Emergency Department Provider Note  ____________________________________________  Time seen: Approximately 11:13 PM  I have reviewed the triage vital signs and the nursing notes.   HISTORY  Chief Complaint Loss of Consciousness    HPI SHELIA [name] is a 54 y.o. male states he's been feeling tired for about 2 weeks. He seen his primary care and notified them, but they did not find an obvious cause. He also reports for the last 2 weeks or so when getting up at night he's been feeling lightheaded and gets a little dizzy if he stands too quickly.  This evening he got up from bed to use the bathroom and after walking about 4 steps got very lightheaded, and he then states that he had to lower himself against the wall. He then ended up on the floor and his wife said that he may have briefly passed out. He recalls awakening on the floor, he denies any injury except for some soreness in his right ankle. States that he is achy all over. No headache. No neck pain.  He denies any palpitations, any feeling of a racing heart, any chest pain, any difficulty breathing, abdominal pain, or other immediate concerns. States he's been in his normal health except for feeling just a little more tired for the last 2 weeks. He does take lisinopril for blood pressure, but this is not been recently changed.   Past Medical History  Diagnosis Date  . Hypertension   . Gout     There are no active problems to display for this patient.   Past Surgical History  Procedure Laterality Date  . Cholecystectomy      Current Outpatient Rx  Name  Route  Sig  Dispense  Refill  . allopurinol (ZYLOPRIM) 100 MG tablet   Oral   Take 100 mg by mouth daily.         Marland Kitchen esomeprazole (NEXIUM) 40 MG capsule   Oral   Take 40 mg by mouth daily at 12 noon.         Marland Kitchen lisinopril (PRINIVIL,ZESTRIL) 20 MG tablet   Oral   Take 20 mg by mouth daily.         . tamsulosin (FLOMAX) 0.4 MG  CAPS capsule   Oral   Take 1 capsule (0.4 mg total) by mouth daily.   10 capsule   0   . cephALEXin (KEFLEX) 500 MG capsule   Oral   Take 1 capsule (500 mg total) by mouth 4 (four) times daily.   40 capsule   0   . ciprofloxacin (CIPRO) 500 MG tablet   Oral   Take 1 tablet (500 mg total) by mouth 2 (two) times daily. Patient not taking: Reported on 06/11/2015   10 tablet   0   . cyclobenzaprine (FLEXERIL) 5 MG tablet   Oral   Take 1 tablet (5 mg total) by mouth 3 (three) times daily as needed for muscle spasms.   15 tablet   0   . ketorolac (TORADOL) 10 MG tablet   Oral   Take 1 tablet (10 mg total) by mouth every 8 (eight) hours.   15 tablet   0     Allergies Review of patient's allergies indicates no known allergies.  No family history on file.  Social History History  Substance Use Topics  . Smoking status: Former Research scientist (life sciences)  . Smokeless tobacco: Not on file  . Alcohol Use: No     Comment: occasionally  Review of Systems Constitutional: No fever/chills Eyes: No visual changes. ENT: No sore throat. Cardiovascular: Denies chest pain. Respiratory: Denies shortness of breath. Gastrointestinal: No abdominal pain.  No nausea, no vomiting.  No diarrhea.  No constipation. Genitourinary: Negative for dysuria. Musculoskeletal: Negative for back pain. No neck pain. Skin: Negative for rash. Neurological: Negative for headaches, focal weakness or numbness.  10-point ROS otherwise negative.  ____________________________________________   PHYSICAL EXAM:  VITAL SIGNS: ED Triage Vitals  Enc Vitals Group     BP --      Pulse --      Resp --      Temp --      Temp src --      SpO2 --      Weight --      Height --      Head Cir --      Peak Flow --      Pain Score 06/11/15 2300 8     Pain Loc --      Pain Edu? --      Excl. in [place]? --     Constitutional: Alert and oriented. Well appearing and in no acute distress. Eyes: Conjunctivae are normal. PERRL.  EOMI. Head: Atraumatic. Nose: No congestion/rhinnorhea. Mouth/Throat: Mucous membranes are moist.  Oropharynx non-erythematous. Neck: No stridor.  No cervical spine tenderness. Cardiovascular: Slightly elevated rate about 1:15, regular rhythm. Grossly normal heart sounds.  Good peripheral circulation. Respiratory: Normal respiratory effort.  No retractions. Lungs CTAB. Gastrointestinal: Soft and nontender. No distention. No abdominal bruits. No CVA tenderness. Musculoskeletal: No lower extremity tenderness except very minimal tenderness over the right lateral malleolus without any deformity or bruising nor edema.  No joint effusions. Full range of motion both ankles, knees, hips about evidence of injury except for minor tenderness around the right lateral ankle. Normal dorsalis pedis pulses bilaterally. Neurologic:  Normal speech and language. No gross focal neurologic deficits are appreciated. No gait instability. Skin:  Skin is warm, dry and intact. No rash noted. Psychiatric: Mood and affect are normal. Speech and behavior are normal.  ____________________________________________   LABS (all labs ordered are listed, but only abnormal results are displayed)  Labs Reviewed  URINALYSIS COMPLETEWITH MICROSCOPIC ([place]) - Abnormal; Notable for the following:    Color, Urine YELLOW (*)    APPearance CLEAR (*)    Leukocytes, UA 3+ (*)    Bacteria, UA RARE (*)    Squamous Epithelial / LPF 0-5 (*)    All other components within normal limits  BASIC METABOLIC PANEL - Abnormal; Notable for the following:    Glucose, Bld 158 (*)    All other components within normal limits  CBC WITH DIFFERENTIAL/PLATELET  TSH  TROPONIN I  FIBRIN DERIVATIVES D-DIMER ([facility] ONLY)   ____________________________________________  EKG  Reviewed and interpreted by me Time 2302 Sinus tachycardia with ventricular rate 130 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 In addition his last 2 EKGs demonstrate sinus tachycardia as well.  Impression is sinus tachycardia, old evidence of inferior and anterolateral Q waves which do not appear to be new. No acute new ischemic changes noted. ____________________________________________  RADIOLOGY  CLINICAL DATA: Ankle pain after fall, pain over the lateral malleolus.  EXAM: RIGHT ANKLE - COMPLETE 3+ VIEW  COMPARISON: Right foot radiographs 02/15/2015  FINDINGS: There is an avulsion fracture from dorsal navicular. Adjacent soft tissue edema. No additional fracture of the ankle joint. The ankle mortise is preserved. Achilles tendon enthesophyte again seen.  IMPRESSION: Avulsion fracture from the dorsal navicular. ____________________________________________   PROCEDURES  Procedure(s) performed: None  Critical Care performed: No  ____________________________________________   INITIAL IMPRESSION / ASSESSMENT AND PLAN / ED COURSE  Pertinent labs & imaging results that were available during my care of the patient were reviewed by me and considered in my medical decision making (see chart for details).    Patient presents with weakness for about 2 weeks. Per review his chart he does have a history of some orthostatic hypotension. He is completely neurologically intact without evidence of injury aside from some slight tenderness over the right lateral ankle. His history seems to support possible orthostasis with him having stood from bed walked about 4 feet and then having lower himself to the floor and briefly passing out. He is not having any acute cardiopulmonary symptoms. He does have persistent sinus tachycardia which is consideration included metabolic, dehydration, or other less likely etiologies such as pulmonary embolism though he does not seem to have  any significant risk factors or evidence to support. I will send a d-dimer.  We will obtain basic labs, orthostatics, hydrate him and check a single troponin. His overall exam is very reassuring. He is still tachycardic. Also we'll obtain a urinalysis is reports he was told a few weeks ago that he had a UTI, but was treated for that and states his symptoms seemed to improve.  ----------------------------------------- 3:03 AM on 06/12/2015 -----------------------------------------  Patient reports he feels improved. He is awake alert and resting comfortably. His heart rate is improved and he is in no distress. I discussed with him that he does have a small fracture in the foot and we'll place a short leg posterior splint upon it he'll follow-up with orthopedics. He has normal dorsalis pedis, good circulation, no neurologic injury to the foot. Addition he does have a small amount of bruising to the right at the thenar eminence, no snuffbox tenderness. Discussed obtaining x-rays with him but he does have great range of motion and denies having any significant pain there and some tenderness all bruising and he does not wish to have an x-ray, he does not think it is broken and I concur with him is unlikely broken and he will return to have an x-ray should he still have ongoing pain after a couple of days.  I suspect the patient likely had an episode of slight vasovagal type syncope. In addition, he still does have some slight leukocytes in his urine though much improved from previous I will treat him with some Keflex as he was previously treated with ciprofloxacin. No evidence of acute cardiopulmonary abnormality. No evidence of sepsis or other acute toxic metabolic abnormalities to support patient's lightheadedness this evening.  Patient is doing well able to ambulate safely with crutches. Short leg splint applied to the right lower leg with good motor sensory and capillary refill  post-splinting.  Discharge to home. We'll place the patient on Keflex for what appears to be slight symptoms of ongoing UTI.  Patient was noted to be slightly tachycardic still time of discharge, and I discussed this with the patient. He states his heart rate is frequently elevated when he is slightly nervous which she gets in the emergency room. He appears well-hydrated at this time, has received a liter of fluid and is taking  by mouth well. I will discharge him home with good return precautions and he is very agreeable with plan. ____________________________________________   FINAL CLINICAL IMPRESSION(S) / ED DIAGNOSES  Final diagnoses:  Vasovagal syncope  Urinary tract infection, acute  Foot fracture, right, closed, initial encounter      Delman Kitten, MD 06/12/15 [phone]

## 2015-06-11 NOTE — ED Notes (Signed)
Pt reports passing out tonight at 2200.  Pt states i;m hurting all over.  Pt states both knees hurt, neck hurts, right wrist hurts, and right ankle hurts.  No chest pain or sob.  Pt alert. Speech clear.

## 2015-06-12 ENCOUNTER — Emergency Department: Payer: Self-pay

## 2015-06-12 ENCOUNTER — Other Ambulatory Visit: Payer: Self-pay

## 2015-06-12 LAB — BASIC METABOLIC PANEL
Anion gap: 13 (ref 5–15)
BUN: 12 mg/dL (ref 6–20)
CO2: 23 mmol/L (ref 22–32)
Calcium: 9.4 mg/dL (ref 8.9–10.3)
Chloride: 104 mmol/L (ref 101–111)
Creatinine, Ser: 1.17 mg/dL (ref 0.61–1.24)
GFR calc Af Amer: >60 mL/min (ref >60)
GFR calc non Af Amer: >60 mL/min (ref >60)
Glucose, Bld: 158 mg/dL — ABNORMAL HIGH (ref 65–99)
Potassium: 3.5 mmol/L (ref 3.5–5.1)
Sodium: 140 mmol/L (ref 135–145)

## 2015-06-12 LAB — TROPONIN I: Troponin I: <0.03 ng/mL (ref <0.031)

## 2015-06-12 LAB — URINALYSIS COMPLETE WITH MICROSCOPIC (ARMC ONLY)
Bilirubin Urine: NEGATIVE
Glucose, UA: NEGATIVE mg/dL
Hgb urine dipstick: NEGATIVE
Ketones, ur: NEGATIVE mg/dL
Nitrite: NEGATIVE
Protein, ur: NEGATIVE mg/dL
Specific Gravity, Urine: 1.013 (ref 1.005–1.030)
pH: 6 (ref 5.0–8.0)

## 2015-06-12 LAB — CBC WITH DIFFERENTIAL/PLATELET
Basophils Absolute: 0.1 10*3/uL (ref 0–0.1)
Basophils Relative: 1 %
Eosinophils Absolute: 0.1 10*3/uL (ref 0–0.7)
Eosinophils Relative: 1 %
HCT: 45.3 % (ref 40.0–52.0)
Hemoglobin: 15 g/dL (ref 13.0–18.0)
Lymphocytes Relative: 31 %
Lymphs Abs: 1.9 10*3/uL (ref 1.0–3.6)
MCH: 30.5 pg (ref 26.0–34.0)
MCHC: 33.1 g/dL (ref 32.0–36.0)
MCV: 92.2 fL (ref 80.0–100.0)
Monocytes Absolute: 0.5 10*3/uL (ref 0.2–1.0)
Monocytes Relative: 8 %
Neutro Abs: 3.6 10*3/uL (ref 1.4–6.5)
Neutrophils Relative %: 59 %
Platelets: 200 10*3/uL (ref 150–440)
RBC: 4.91 MIL/uL (ref 4.40–5.90)
RDW: 13.8 % (ref 11.5–14.5)
WBC: 6.1 10*3/uL (ref 3.8–10.6)

## 2015-06-12 LAB — TSH: TSH: 0.68 u[IU]/mL (ref 0.350–4.500)

## 2015-06-12 LAB — FIBRIN DERIVATIVES D-DIMER (ARMC ONLY): Fibrin derivatives D-dimer (ARMC): 291 (ref 0–499)

## 2015-06-12 MED ORDER — CEPHALEXIN 500 MG PO CAPS: 500.0000 mg | ORAL_CAPSULE | Freq: Four times a day (QID) | ORAL | Status: DC

## 2015-06-12 MED ORDER — DEXTROSE 5 % IV SOLN
1.0000 g | Freq: Once | INTRAVENOUS | Status: AC
  Administered 2015-06-12: 1 g via INTRAVENOUS

## 2015-06-12 MED ORDER — DEXTROSE 5 % IV SOLN
INTRAVENOUS | Status: AC
  Administered 2015-06-12: 1 g via INTRAVENOUS
  Filled 2015-06-12: qty 10

## 2015-06-12 NOTE — ED Notes (Signed)
Patient with no complaints at this time. Respirations even and unlabored. Skin warm/dry. Discharge instructions reviewed with patient at this time. Patient given opportunity to voice concerns/ask questions. IV removed per policy and band-aid applied to site. Patient discharged at this time and left Emergency Department, via wheelchair.   

## 2015-06-12 NOTE — Discharge Instructions (Signed)
Syncope  Please make sure to stay well-hydrated. Follow up with your doctor this week as well as with orthopedics. If you are unable see her doctor within 1-2 days, please come back to the [facility] urgent care walk-in clinic for reevaluation.  Return to the emergency room right away if you develop another's feeling that you may be lightheaded or pass out, he develop any chest pain or trouble breathing, have abdominal pain, have severe pain numbness tingling or cold or blue foot, or other new concerns arise.  Please do not place weight on your right foot. You'll need to use crutches as appropriate and follow-up with orthopedics within 1 week.  Syncope is a medical term for fainting or passing out. This means you lose consciousness and drop to the ground. People are generally unconscious for less than 5 minutes. You may have some muscle twitches for up to 15 seconds before waking up and returning to normal. Syncope occurs more often in older adults, but it can happen to anyone. While most causes of syncope are not dangerous, syncope can be a sign of a serious medical problem. It is important to seek medical care.  CAUSES  Syncope is caused by a sudden drop in blood flow to the brain. The specific cause is often not determined. Factors that can bring on syncope include:  Taking medicines that lower blood pressure.  Sudden changes in posture, such as standing up quickly.  Taking more medicine than prescribed.  Standing in one place for too long.  Seizure disorders.  Dehydration and excessive exposure to heat.  Low blood sugar (hypoglycemia).  Straining to have a bowel movement.  Heart disease, irregular heartbeat, or other circulatory problems.  Fear, emotional distress, seeing blood, or severe pain. SYMPTOMS  Right before fainting, you may:  Feel dizzy or light-headed.  Feel nauseous.  See all white or all black in your field of vision.  Have cold, clammy skin. DIAGNOSIS  Your  health care provider will ask about your symptoms, perform a physical exam, and perform an electrocardiogram (ECG) to record the electrical activity of your heart. Your health care provider may also perform other heart or blood tests to determine the cause of your syncope which may include:  Transthoracic echocardiogram (TTE). During echocardiography, sound waves are used to evaluate how blood flows through your heart.  Transesophageal echocardiogram (TEE).  Cardiac monitoring. This allows your health care provider to monitor your heart rate and rhythm in real time.  Holter monitor. This is a portable device that records your heartbeat and can help diagnose heart arrhythmias. It allows your health care provider to track your heart activity for several days, if needed.  Stress tests by exercise or by giving medicine that makes the heart beat faster. TREATMENT  In most cases, no treatment is needed. Depending on the cause of your syncope, your health care provider may recommend changing or stopping some of your medicines. HOME CARE INSTRUCTIONS  Have someone stay with you until you feel stable.  Do not drive, use machinery, or play sports until your health care provider says it is okay.  Keep all follow-up appointments as directed by your health care provider.  Lie down right away if you start feeling like you might faint. Breathe deeply and steadily. Wait until all the symptoms have passed.  Drink enough fluids to keep your urine clear or pale yellow.  If you are taking blood pressure or heart medicine, get up slowly and take several minutes to sit  and then stand. This can reduce dizziness. SEEK IMMEDIATE MEDICAL CARE IF:   You have a severe headache.  You have unusual pain in the chest, abdomen, or back.  You are bleeding from your mouth or rectum, or you have black or tarry stool.  You have an irregular or very fast heartbeat.  You have pain with breathing.  You have repeated  fainting or seizure-like jerking during an episode.  You faint when sitting or lying down.  You have confusion.  You have trouble walking.  You have severe weakness.  You have vision problems. If you fainted, call your local emergency services (911 in [place] Do not drive yourself to the hospital.  MAKE SURE YOU:  Understand these instructions.  Will watch your condition.  Will get help right away if you are not doing well or get worse. Document Released: 11/02/2005 Document Revised: 11/07/2013 Document Reviewed: 01/01/2012 [facility] Patient Information 2015 [place], [place]. This information is not intended to replace advice given to you by your health care provider. Make sure you discuss any questions you have with your health care provider.  Urinary Tract Infection A urinary tract infection (UTI) can occur any place along the urinary tract. The tract includes the kidneys, ureters, bladder, and urethra. A type of germ called bacteria often causes a UTI. UTIs are often helped with antibiotic medicine.  HOME CARE   If given, take antibiotics as told by your doctor. Finish them even if you start to feel better.  Drink enough fluids to keep your pee (urine) clear or pale yellow.  Avoid tea, drinks with caffeine, and bubbly (carbonated) drinks.  Pee often. Avoid holding your pee in for a long time.  Pee before and after having sex (intercourse).  Wipe from front to back after you poop (bowel movement) if you are a woman. Use each tissue only once. GET HELP RIGHT AWAY IF:   You have back pain.  You have lower belly (abdominal) pain.  You have chills.  You feel sick to your stomach (nauseous).  You throw up (vomit).  Your burning or discomfort with peeing does not go away.  You have a fever.  Your symptoms are not better in 3 days. MAKE SURE YOU:   Understand these instructions.  Will watch your condition.  Will get help right away if you are not doing well or get  worse. Document Released: 04/20/2008 Document Revised: 07/27/2012 Document Reviewed: 06/02/2012 [facility] Patient Information 2015 [place], [place]. This information is not intended to replace advice given to you by your health care provider. Make sure you discuss any questions you have with your health care provider.

## 2015-06-13 ENCOUNTER — Emergency Department
Admission: EM | Admit: 2015-06-13 | Discharge: 2015-06-13 | Disposition: A | Discharge status: Home / Self Care | Payer: Self-pay | Attending: Emergency Medicine | Admitting: Emergency Medicine

## 2015-06-13 ENCOUNTER — Emergency Department: Payer: Self-pay

## 2015-06-13 ENCOUNTER — Encounter: Payer: Self-pay | Admitting: *Deleted

## 2015-06-13 DIAGNOSIS — Z792 Long term (current) use of antibiotics: Secondary | ICD-10-CM | POA: Insufficient documentation

## 2015-06-13 DIAGNOSIS — I10 Essential (primary) hypertension: Secondary | ICD-10-CM | POA: Insufficient documentation

## 2015-06-13 DIAGNOSIS — R52 Pain, unspecified: Secondary | ICD-10-CM

## 2015-06-13 DIAGNOSIS — Z87891 Personal history of nicotine dependence: Secondary | ICD-10-CM | POA: Insufficient documentation

## 2015-06-13 DIAGNOSIS — M25561 Pain in right knee: Secondary | ICD-10-CM | POA: Insufficient documentation

## 2015-06-13 DIAGNOSIS — M10471 Other secondary gout, right ankle and foot: Secondary | ICD-10-CM | POA: Insufficient documentation

## 2015-06-13 DIAGNOSIS — Z79899 Other long term (current) drug therapy: Secondary | ICD-10-CM | POA: Insufficient documentation

## 2015-06-13 MED ORDER — INDOMETHACIN 50 MG PO CAPS
50.0000 mg | ORAL_CAPSULE | Freq: Once | ORAL | Status: AC
  Administered 2015-06-13: 50 mg via ORAL
  Filled 2015-06-13: qty 1

## 2015-06-13 MED ORDER — OXYCODONE HCL 5 MG PO TABS
5.0000 mg | ORAL_TABLET | Freq: Once | ORAL | Status: AC
  Administered 2015-06-13: 5 mg via ORAL
  Filled 2015-06-13: qty 1

## 2015-06-13 MED ORDER — HYDROCODONE-ACETAMINOPHEN 5-325 MG PO TABS: 1.0000 | ORAL_TABLET | ORAL | Status: DC | PRN

## 2015-06-13 NOTE — ED Notes (Signed)
Patient refusing right knee immobilizer

## 2015-06-13 NOTE — ED Notes (Signed)
Pt fractured right foot two days , pt complaining of increased pain

## 2015-06-13 NOTE — ED Notes (Signed)
AAOx3.  Skin warm and dry.  NAD 

## 2015-06-13 NOTE — ED Provider Notes (Signed)
[facility] Emergency Department Provider Note ____________________________________________  Time seen: Approximately 3:31 PM  I have reviewed the triage vital signs and the nursing notes.   HISTORY  Chief Complaint Foot Pain   HPI Miguel [name] is a 54 y.o. male who returns to the emergency department for evaluation of increasing severity of right foot and ankle pain. He states that he lost consciousness 2 days ago while he was walking to the bathroom. He was seen here for evaluation after the fall and diagnosed with a navicular fracture. He states that his right foot now feels as if he has gout. He reports a history of gout but states that he is on preventative medications and is concerned that gout is not the reason for the pain. He also complains of right knee pain. He states that there were no x-rays taken of the knee the night of the fall. He states that the knee feels somewhat weak, like it is going to give out when he puts any pressure on the foot.   Past Medical History  Diagnosis Date  . Hypertension   . Gout     There are no active problems to display for this patient.   Past Surgical History  Procedure Laterality Date  . Cholecystectomy      Current Outpatient Rx  Name  Route  Sig  Dispense  Refill  . allopurinol (ZYLOPRIM) 100 MG tablet   Oral   Take 100 mg by mouth daily.         . cephALEXin (KEFLEX) 500 MG capsule   Oral   Take 1 capsule (500 mg total) by mouth 4 (four) times daily.   40 capsule   0   . ciprofloxacin (CIPRO) 500 MG tablet   Oral   Take 1 tablet (500 mg total) by mouth 2 (two) times daily. Patient not taking: Reported on 06/11/2015   10 tablet   0   . cyclobenzaprine (FLEXERIL) 5 MG tablet   Oral   Take 1 tablet (5 mg total) by mouth 3 (three) times daily as needed for muscle spasms.   15 tablet   0   . esomeprazole (NEXIUM) 40 MG capsule   Oral   Take 40 mg by mouth daily at 12 noon.         Marland Kitchen  HYDROcodone-acetaminophen (NORCO/VICODIN) 5-325 MG per tablet   Oral   Take 1 tablet by mouth every 4 (four) hours as needed.   12 tablet   0   . ketorolac (TORADOL) 10 MG tablet   Oral   Take 1 tablet (10 mg total) by mouth every 8 (eight) hours.   15 tablet   0   . lisinopril (PRINIVIL,ZESTRIL) 20 MG tablet   Oral   Take 20 mg by mouth daily.         . tamsulosin (FLOMAX) 0.4 MG CAPS capsule   Oral   Take 1 capsule (0.4 mg total) by mouth daily.   10 capsule   0     Allergies Review of patient's allergies indicates no known allergies.  No family history on file.  Social History History  Substance Use Topics  . Smoking status: Former Research scientist (life sciences)  . Smokeless tobacco: Not on file  . Alcohol Use: No     Comment: occasionally    Review of Systems Constitutional: No recent illness. Eyes: No visual changes. ENT: No sore throat. Cardiovascular: Denies chest pain or palpitations. Respiratory: Denies shortness of breath. Gastrointestinal: No abdominal  pain.  Genitourinary: Negative for dysuria. Musculoskeletal: Pain in Right ankle, foot, knee, and thenar eminence.  Skin: Negative for rash. Neurological: Negative for headaches, focal weakness or numbness. 10-point ROS otherwise negative.  ____________________________________________   PHYSICAL EXAM:  VITAL SIGNS: ED Triage Vitals  Enc Vitals Group     BP 06/13/15 1247 147/100 mmHg     Pulse Rate 06/13/15 1530 101     Resp 06/13/15 1247 18     Temp 06/13/15 1247 98.1 F (36.7 C)     Temp Source 06/13/15 1247 Oral     SpO2 06/13/15 1247 100 %     Weight 06/13/15 1247 210 lb (95.255 kg)     Height 06/13/15 1247 6\' 1"  (1.854 m)     Head Cir --      Peak Flow --      Pain Score 06/13/15 1249 10     Pain Loc --      Pain Edu? --      Excl. in [place]? --     Constitutional: Alert and oriented. Well appearing and in no acute distress. Eyes: Conjunctivae are normal. EOMI. Head: Atraumatic. Nose: No  congestion/rhinnorhea. Neck: No stridor.  Respiratory: Normal respiratory effort.   Musculoskeletal: Ace wrap and padding removed from splinted RLE-- Erythema, warmth, and swelling present over the malleolus which is consistent with gout secondary to injury. PT and DP pulses 2+ with appropriate capillary refill. Neurologic:  Normal speech and language. No gross focal neurologic deficits are appreciated. Speech is normal. No gait instability. Skin:  Skin is warm, dry and intact. Atraumatic. Psychiatric: Mood and affect are normal. Speech and behavior are normal.  ____________________________________________   LABS (all labs ordered are listed, but only abnormal results are displayed)  Labs Reviewed - No data to display ____________________________________________  RADIOLOGY  Tib fib negative for acute bony abnormality. Calcaneal spur present. ____________________________________________   PROCEDURES  Procedure(s) performed: Short leg OCL replaced by ER tech. Neurovascularly intact post application.   ____________________________________________   INITIAL IMPRESSION / ASSESSMENT AND PLAN / ED COURSE  Pertinent labs & imaging results that were available during my care of the patient were reviewed by me and considered in my medical decision making (see chart for details).  Patient was advised to follow up with orthopedics as soon as possible. He was advised to avoid weight bearing on the right side and advised to use the crutches given at his last visit. He was advised to return to the emergency department if his toes/foot feel cold or if he has an increase in pain. ____________________________________________   FINAL CLINICAL IMPRESSION(S) / ED DIAGNOSES  Final diagnoses:  Pain  Knee pain, acute, right  Other secondary acute gout of right foot       Victorino Dike, FNP 06/13/15 1542  Harvest Dark, MD 06/14/15 1245

## 2015-06-13 NOTE — ED Notes (Addendum)
Patient fractured right foot and it now c/o ankle and knee pain.  Patient states "this feels like gout pain".

## 2015-06-17 IMAGING — CR DG HAND COMPLETE 3+V*L*
1 series · 3 of 3 positions shown · non-contrast
Comparison: None.

CLINICAL DATA: Trauma to left hand with laceration dorsally near
the third MCP joint. In the showing counter.

EXAM:
LEFT HAND - COMPLETE 3+ VIEW

[Series 1: dxr hand lt complete  w/obliques · 0.14mm/px · 3 of 3 slices shown]
[im 1/3]
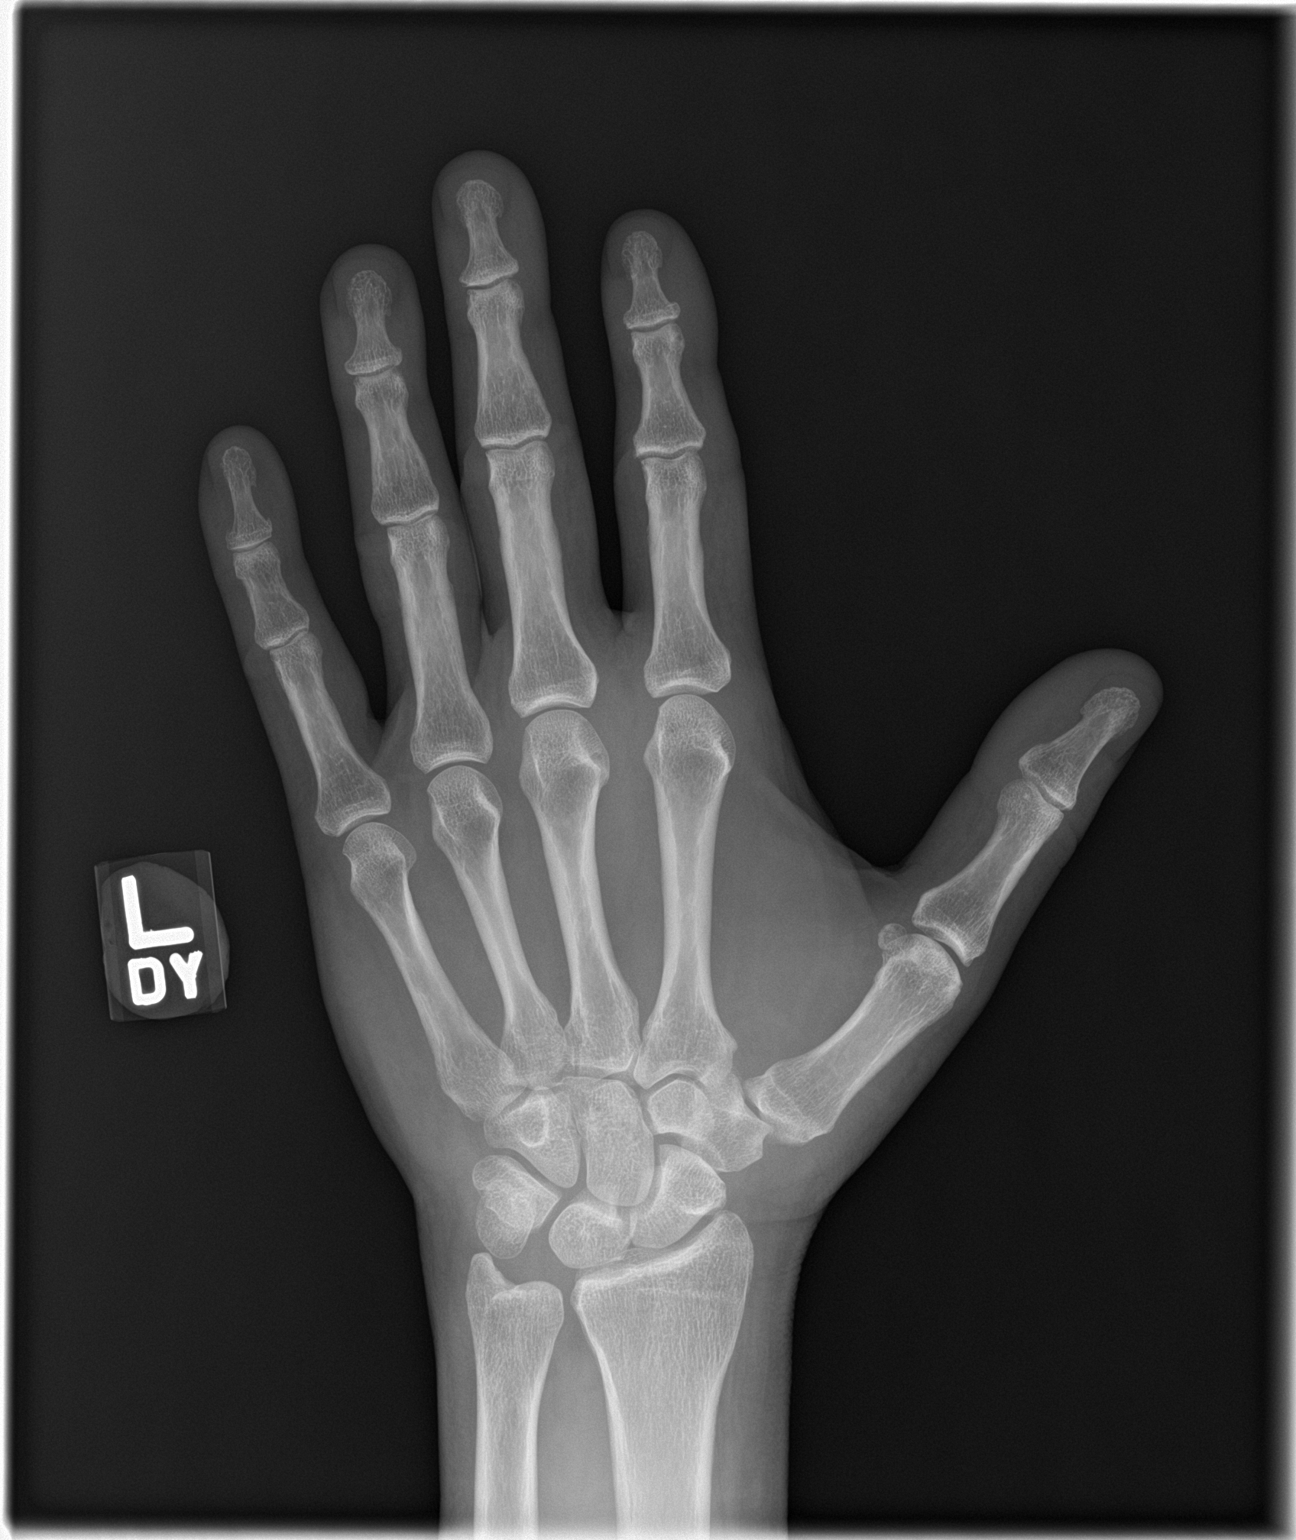
[im 2/3]
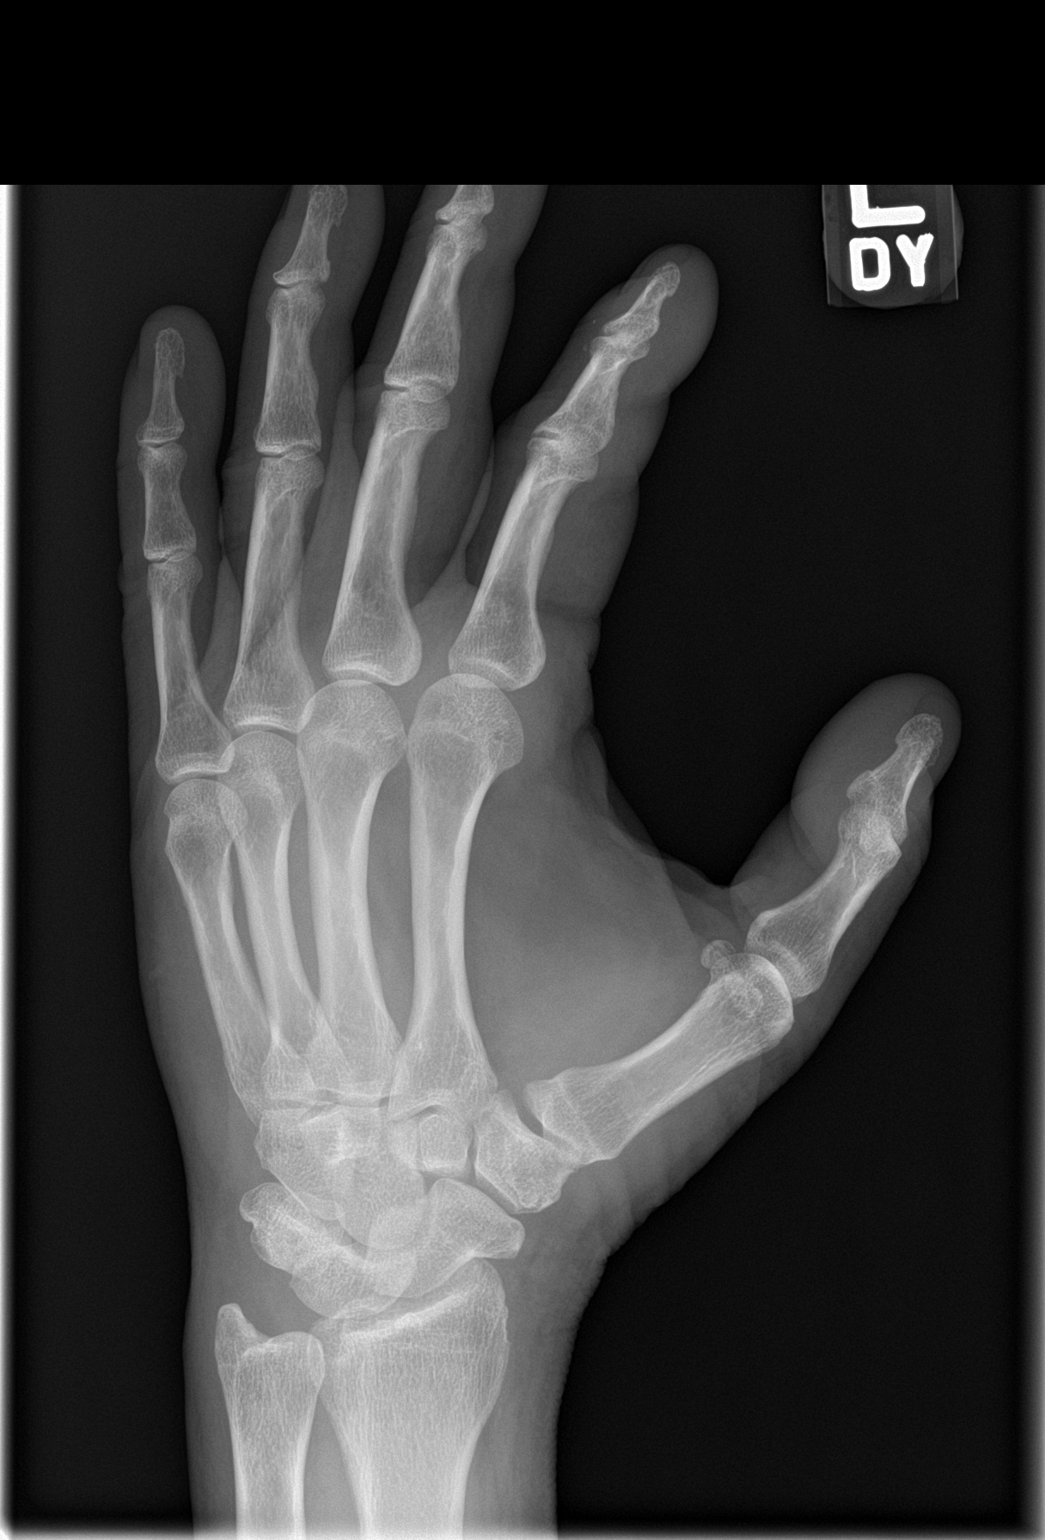
[im 3/3]
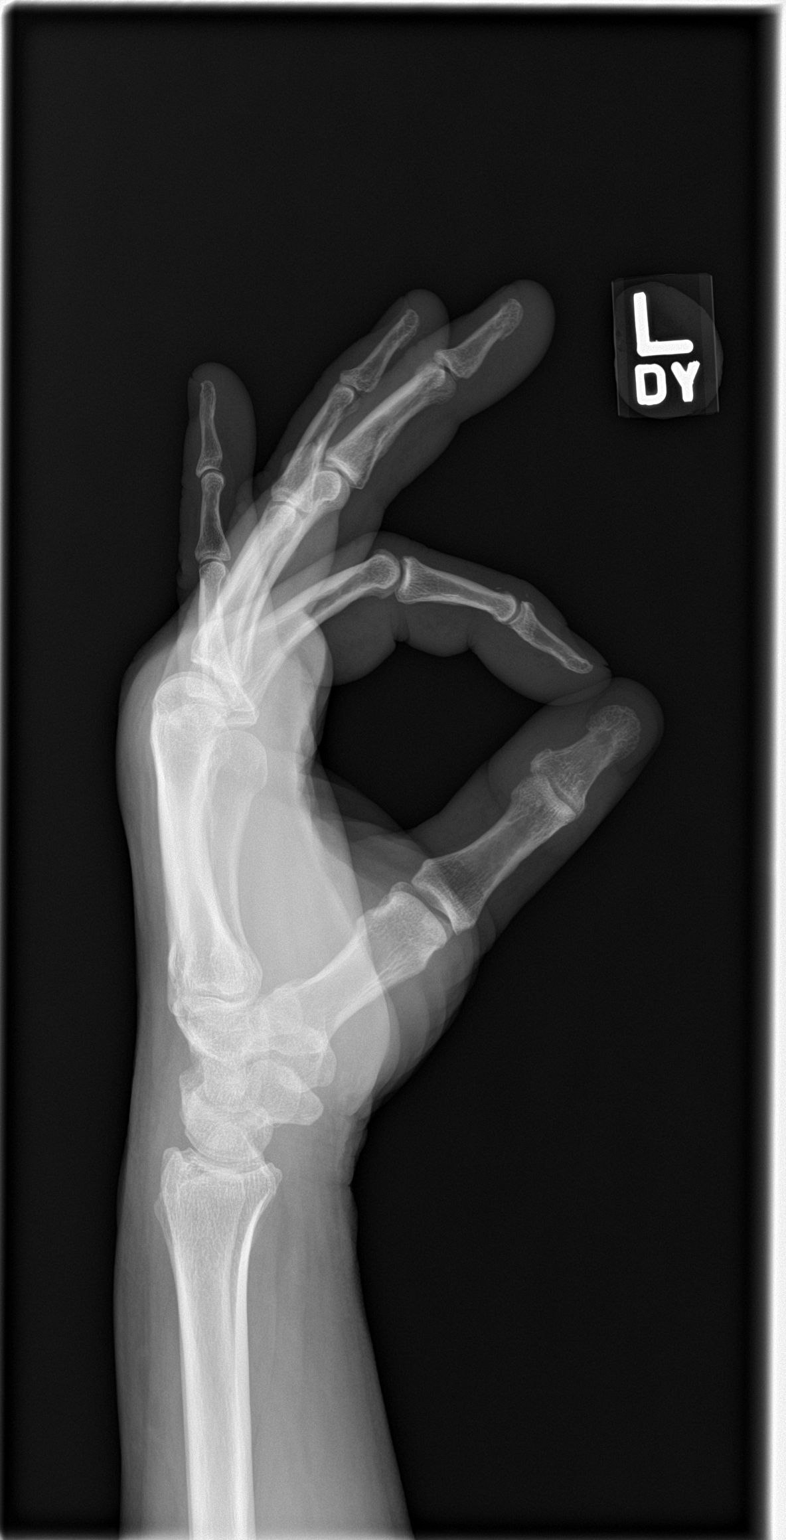

[3 of 3 positions shown; findings below may reference images not displayed]

FINDINGS: No fracture, dislocation or soft tissue foreign body is visualized.
No arthropathy or bony lesions.
IMPRESSION: Normal radiographs of the left hand.

## 2015-07-04 ENCOUNTER — Encounter (INDEPENDENT_AMBULATORY_CARE_PROVIDER_SITE_OTHER): Payer: Self-pay

## 2015-07-18 ENCOUNTER — Encounter: Payer: Self-pay | Admitting: Pharmacist

## 2015-07-25 ENCOUNTER — Encounter: Payer: Self-pay | Admitting: Pharmacist

## 2015-07-31 ENCOUNTER — Encounter: Payer: Self-pay | Admitting: Pharmacist

## 2015-08-01 ENCOUNTER — Encounter: Payer: Self-pay | Admitting: Pharmacist

## 2015-08-29 ENCOUNTER — Ambulatory Visit: Payer: Self-pay

## 2015-09-02 ENCOUNTER — Emergency Department
Admission: EM | Admit: 2015-09-02 | Discharge: 2015-09-02 | Disposition: A | Discharge status: Home / Self Care | Payer: Self-pay | Attending: Emergency Medicine | Admitting: Emergency Medicine

## 2015-09-02 ENCOUNTER — Encounter: Payer: Self-pay | Admitting: Emergency Medicine

## 2015-09-02 DIAGNOSIS — I10 Essential (primary) hypertension: Secondary | ICD-10-CM | POA: Insufficient documentation

## 2015-09-02 DIAGNOSIS — Z791 Long term (current) use of non-steroidal anti-inflammatories (NSAID): Secondary | ICD-10-CM | POA: Insufficient documentation

## 2015-09-02 DIAGNOSIS — Z87891 Personal history of nicotine dependence: Secondary | ICD-10-CM | POA: Insufficient documentation

## 2015-09-02 DIAGNOSIS — N410 Acute prostatitis: Secondary | ICD-10-CM | POA: Insufficient documentation

## 2015-09-02 DIAGNOSIS — Z79899 Other long term (current) drug therapy: Secondary | ICD-10-CM | POA: Insufficient documentation

## 2015-09-02 HISTORY — DX: Unspecified renal colic: N23

## 2015-09-02 LAB — URINALYSIS COMPLETE WITH MICROSCOPIC (ARMC ONLY)
Bacteria, UA: NONE SEEN
Bilirubin Urine: NEGATIVE
Glucose, UA: NEGATIVE mg/dL
Hgb urine dipstick: NEGATIVE
Ketones, ur: NEGATIVE mg/dL
Nitrite: NEGATIVE
Protein, ur: NEGATIVE mg/dL
Specific Gravity, Urine: 1.016 (ref 1.005–1.030)
pH: 6 (ref 5.0–8.0)

## 2015-09-02 MED ORDER — SULFAMETHOXAZOLE-TRIMETHOPRIM 800-160 MG PO TABS: 1.0000 | ORAL_TABLET | Freq: Two times a day (BID) | ORAL | Status: DC

## 2015-09-02 MED ORDER — SULFAMETHOXAZOLE-TRIMETHOPRIM 800-160 MG PO TABS
1.0000 | ORAL_TABLET | Freq: Once | ORAL | Status: AC
  Administered 2015-09-02: 1 via ORAL
  Filled 2015-09-02: qty 1

## 2015-09-02 MED ORDER — TRAMADOL HCL 50 MG PO TABS: 50.0000 mg | ORAL_TABLET | Freq: Two times a day (BID) | ORAL | Status: DC

## 2015-09-02 NOTE — ED Notes (Signed)

## 2015-09-02 NOTE — Discharge Instructions (Signed)
Prostatitis The prostate gland is about the size and shape of a walnut. It is located just below your bladder. It produces one of the components of semen, which is made up of sperm and the fluids that help nourish and transport it out from the testicles. Prostatitis is inflammation of the prostate gland.  There are four types of prostatitis:  Acute bacterial prostatitis. This is the least common type of prostatitis. It starts quickly and usually is associated with a bladder infection, high fever, and shaking chills. It can occur at any age.  Chronic bacterial prostatitis. This is a persistent bacterial infection in the prostate. It usually develops from repeated acute bacterial prostatitis or acute bacterial prostatitis that was not properly treated. It can occur in men of any age but is most common in middle-aged men whose prostate has begun to enlarge. The symptoms are not as severe as those in acute bacterial prostatitis. Discomfort in the part of your body that is in front of your rectum and below your scrotum (perineum), lower abdomen, or in the head of your penis (glans) may represent your primary discomfort.  Chronic prostatitis (nonbacterial). This is the most common type of prostatitis. It is inflammation of the prostate gland that is not caused by a bacterial infection. The cause is unknown and may be associated with a viral infection or autoimmune disorder.  Prostatodynia (pelvic floor disorder). This is associated with increased muscular tone in the pelvis surrounding the prostate. CAUSES The causes of bacterial prostatitis are bacterial infection. The causes of the other types of prostatitis are unknown.  SYMPTOMS  Symptoms can vary depending upon the type of prostatitis that exists. There can also be overlap in symptoms. Possible symptoms for each type of prostatitis are listed below. Acute Bacterial Prostatitis  Painful urination.  Fever or chills.  Muscle or joint pains.  Low  back pain.  Low abdominal pain.  Inability to empty bladder completely. Chronic Bacterial Prostatitis, Chronic Nonbacterial Prostatitis, and Prostatodynia  Sudden urge to urinate.  Frequent urination.  Difficulty starting urine stream.  Weak urine stream.  Discharge from the urethra.  Dribbling after urination.  Rectal pain.  Pain in the testicles, penis, or tip of the penis.  Pain in the perineum.  Problems with sexual function.  Painful ejaculation.  Bloody semen. DIAGNOSIS  In order to diagnose prostatitis, your health care provider will ask about your symptoms. One or more urine samples will be taken and tested (urinalysis). If the urinalysis result is negative for bacteria, your health care provider may use a finger to feel your prostate (digital rectal exam). This exam helps your health care provider determine if your prostate is swollen and tender. It will also produce a specimen of semen that can be analyzed. TREATMENT  Treatment for prostatitis depends on the cause. If a bacterial infection is the cause, it can be treated with antibiotic medicine. In cases of chronic bacterial prostatitis, the use of antibiotics for up to 1 month or 6 weeks may be necessary. Your health care provider may instruct you to take sitz baths to help relieve pain. A sitz bath is a bath of hot water in which your hips and buttocks are under water. This relaxes the pelvic floor muscles and often helps to relieve the pressure on your prostate. HOME CARE INSTRUCTIONS   Take all medicines as directed by your health care provider.  Take sitz baths as directed by your health care provider. SEEK MEDICAL CARE IF:   Your symptoms  get worse, not better.  You have a fever. SEEK IMMEDIATE MEDICAL CARE IF:   You have chills.  You feel nauseous or vomit.  You feel lightheaded or faint.  You are unable to urinate.  You have blood or blood clots in your urine. MAKE SURE YOU:  Understand  these instructions.  Will watch your condition.  Will get help right away if you are not doing well or get worse.   This information is not intended to replace advice given to you by your health care provider. Make sure you discuss any questions you have with your health care provider.   Document Released: 10/30/2000 Document Revised: 11/23/2014 Document Reviewed: 05/22/2013 Elsevier Interactive Patient Education 2016 [place] the prescription antibiotic as directed, until completely gone.  Take the pain medicine as needed.  Follow-up with your provider for retest after treatment.

## 2015-09-02 NOTE — ED Notes (Signed)
Pt presents to ED c/o right side flank pain, reports pain woke him up at 4 or 5 am this morning. Pt reports "I am taking new blood pressure and medicine for gout, I haven't been feeling good since then." States PCP doubled one blood pressure medicine and added another one, pt unsure of medicine name. Pt denies dysuria, denies hematuria, denies abdominal pain or chest pain. Has an increase in urinary frequency. Hx of kidney stone but states "it doesn't feel the same." Pt A&O x 4, respirations even and unlabored, skin warm and dry.

## 2015-09-02 NOTE — ED Provider Notes (Signed)
[facility] Emergency Department Provider Note ____________________________________________  Time seen: 2045  I have reviewed the triage vital signs and the nursing notes.  HISTORY  Chief Complaint  No chief complaint on file.   HPI Miguel [name] is a 54 y.o. male reports to the ED for evaluation of pain to the right lower back with onset this morning. He describes the pain woke him suddenly spun at about 4 AM. He denies any injury, trauma, slip trip or fall. He does note some complaints of incomplete voiding with urination, but denies any dysuria, hematuria, or flank pain. He notes his pain is aggravated with movements and twisting. He has not dosed any medication at home for his pain and discomfort at this time. He reports this evening for evaluation treatment of right-sided low back pain without LE referral. He rates his pain in triage and 8/10.  Past Medical History  Diagnosis Date  . Hypertension   . Gout   . Renal colic     There are no active problems to display for this patient.   Past Surgical History  Procedure Laterality Date  . Cholecystectomy      Current Outpatient Rx  Name  Route  Sig  Dispense  Refill  . allopurinol (ZYLOPRIM) 100 MG tablet   Oral   Take 100 mg by mouth daily.         . cyclobenzaprine (FLEXERIL) 5 MG tablet   Oral   Take 1 tablet (5 mg total) by mouth 3 (three) times daily as needed for muscle spasms.   15 tablet   0   . esomeprazole (NEXIUM) 40 MG capsule   Oral   Take 40 mg by mouth daily at 12 noon.         Marland Kitchen HYDROcodone-acetaminophen (NORCO/VICODIN) 5-325 MG per tablet   Oral   Take 1 tablet by mouth every 4 (four) hours as needed.   12 tablet   0   . ketorolac (TORADOL) 10 MG tablet   Oral   Take 1 tablet (10 mg total) by mouth every 8 (eight) hours.   15 tablet   0   . lisinopril (PRINIVIL,ZESTRIL) 20 MG tablet   Oral   Take 20 mg by mouth daily.         Marland Kitchen sulfamethoxazole-trimethoprim  (BACTRIM DS,SEPTRA DS) 800-160 MG tablet   Oral   Take 1 tablet by mouth 2 (two) times daily.   20 tablet   0   . tamsulosin (FLOMAX) 0.4 MG CAPS capsule   Oral   Take 1 capsule (0.4 mg total) by mouth daily.   10 capsule   0   . traMADol (ULTRAM) 50 MG tablet   Oral   Take 1 tablet (50 mg total) by mouth 2 (two) times daily.   10 tablet   0    Allergies Review of patient's allergies indicates no known allergies.  No family history on file.  Social History Social History  Substance Use Topics  . Smoking status: Former Research scientist (life sciences)  . Smokeless tobacco: None  . Alcohol Use: No     Comment: occasionally   Review of Systems  Constitutional: Negative for fever. Eyes: Negative for visual changes. ENT: Negative for sore throat. Cardiovascular: Negative for chest pain. Respiratory: Negative for shortness of breath. Gastrointestinal: Negative for abdominal pain, vomiting and diarrhea. Genitourinary: Negative for dysuria. Musculoskeletal: Positive for back pain. Skin: Negative for rash. Neurological: Negative for headaches, focal weakness or numbness. ____________________________________________  PHYSICAL EXAM:  VITAL SIGNS: ED Triage Vitals  Enc Vitals Group     BP 09/02/15 1940 151/97 mmHg     Pulse Rate 09/02/15 1940 104     Resp 09/02/15 1940 18     Temp 09/02/15 1940 98.1 F (36.7 C)     Temp Source 09/02/15 1940 Oral     SpO2 09/02/15 1940 94 %     Weight 09/02/15 1940 200 lb (90.719 kg)     Height 09/02/15 1940 6\' 1"  (1.854 m)     Head Cir --      Peak Flow --      Pain Score 09/02/15 1942 8     Pain Loc --      Pain Edu? --      Excl. in [place]? --    Constitutional: Alert and oriented. Well appearing and in no distress. Head: Normocephalic and atraumatic.      Eyes: Conjunctivae are normal. PERRL. Normal extraocular movements      Ears: Canals clear. TMs intact bilaterally.   Nose: No congestion/rhinorrhea.   Mouth/Throat: Mucous membranes are  moist.   Neck: Supple. No thyromegaly. Hematological/Lymphatic/Immunological: No cervical lymphadenopathy. Cardiovascular: Normal rate, regular rhythm.  Respiratory: Normal respiratory effort. No wheezes/rales/rhonchi. Gastrointestinal: Soft and nontender. No distention. Musculoskeletal: Normal fluid transition from sit to stand without difficulty. Normal spinal alignment without spasm, deformity, minimal tenderness, or step-off. Patient with Fara Olden tenderness to palpation over the right lumbar sacral region and right flank region. He has normal fluid range of motion at the lumbar spine to toe touch. Negative seated straight leg raise on exam. Normal hip flexion is appreciated in the standing position. Nontender with normal range of motion in all extremities.  Neurologic: Nerves II through XII grossly intact, normal LE DTRs bilaterally. Toe heel raise. Normal gait without ataxia. Normal speech and language. No gross focal neurologic deficits are appreciated. Skin:  Skin is warm, dry and intact. No rash noted. Psychiatric: Mood and affect are normal. Patient exhibits appropriate insight and judgment. ____________________________________________   LABS (pertinent positives/negatives) Labs Reviewed  URINALYSIS COMPLETEWITH MICROSCOPIC ([facility] ONLY) - Abnormal; Notable for the following:    Color, Urine YELLOW (*)    APPearance HAZY (*)    Leukocytes, UA 3+ (*)    Squamous Epithelial / LPF 0-5 (*)    All other components within normal limits  URINE CULTURE  ____________________________________________  PROCEDURES  Bactrim DS 1 PO ____________________________________________  INITIAL IMPRESSION / ASSESSMENT AND PLAN / ED COURSE  Patient is going to be treated for a what appears to be a chronic prostatitis by review of his chart. He has again a asymptomatic bacteriuria on exam. He will be treated empirically for an acute prostatitis with Bactrim DS. He'll also be provided with Ultram for  acute pain relief. Urine culture is again pending. Review of his most recent urine culture did not show any bacterial growth after 24 hours. Patient is encouraged follow-up with his [facility], for retest following treatment regimen. ____________________________________________  FINAL CLINICAL IMPRESSION(S) / ED DIAGNOSES  Final diagnoses:  Prostatitis, acute       Melvenia Needles, PA-C 09/02/15 2209  Nance Pear, MD 09/03/15 [phone]

## 2015-09-02 NOTE — ED Notes (Signed)
Jenise, PA notified of pt heart rate, states okay for pt to be discharged. Pt states heart rate was elevated at PCP office and has been recently elevated.

## 2015-09-02 NOTE — ED Notes (Signed)
C/O right lower back pain.  Pain worse with movement and twisting motions.  Onset of symptoms this morning at 0400

## 2015-09-04 LAB — URINE CULTURE: Special Requests: NORMAL

## 2015-09-17 ENCOUNTER — Emergency Department
Admission: EM | Admit: 2015-09-17 | Discharge: 2015-09-17 | Disposition: A | Discharge status: Home / Self Care | Payer: Self-pay | Attending: Emergency Medicine | Admitting: Emergency Medicine

## 2015-09-17 ENCOUNTER — Encounter: Payer: Self-pay | Admitting: Emergency Medicine

## 2015-09-17 ENCOUNTER — Emergency Department: Payer: Self-pay

## 2015-09-17 DIAGNOSIS — F10920 Alcohol use, unspecified with intoxication, uncomplicated: Secondary | ICD-10-CM

## 2015-09-17 DIAGNOSIS — I10 Essential (primary) hypertension: Secondary | ICD-10-CM | POA: Insufficient documentation

## 2015-09-17 DIAGNOSIS — F329 Major depressive disorder, single episode, unspecified: Secondary | ICD-10-CM | POA: Insufficient documentation

## 2015-09-17 DIAGNOSIS — R0602 Shortness of breath: Secondary | ICD-10-CM | POA: Insufficient documentation

## 2015-09-17 DIAGNOSIS — F10120 Alcohol abuse with intoxication, uncomplicated: Secondary | ICD-10-CM | POA: Insufficient documentation

## 2015-09-17 DIAGNOSIS — Z87891 Personal history of nicotine dependence: Secondary | ICD-10-CM | POA: Insufficient documentation

## 2015-09-17 DIAGNOSIS — R079 Chest pain, unspecified: Secondary | ICD-10-CM | POA: Insufficient documentation

## 2015-09-17 DIAGNOSIS — F1310 Sedative, hypnotic or anxiolytic abuse, uncomplicated: Secondary | ICD-10-CM | POA: Insufficient documentation

## 2015-09-17 LAB — URINE DRUG SCREEN, QUALITATIVE (ARMC ONLY)
Amphetamines, Ur Screen: NOT DETECTED
Barbiturates, Ur Screen: NOT DETECTED
Benzodiazepine, Ur Scrn: NOT DETECTED
Cannabinoid 50 Ng, Ur ~~LOC~~: NOT DETECTED
Cocaine Metabolite,Ur ~~LOC~~: NOT DETECTED
MDMA (Ecstasy)Ur Screen: NOT DETECTED
Methadone Scn, Ur: NOT DETECTED
Opiate, Ur Screen: NOT DETECTED
Phencyclidine (PCP) Ur S: NOT DETECTED
Tricyclic, Ur Screen: POSITIVE — AB

## 2015-09-17 LAB — CBC
HCT: 46 % (ref 40.0–52.0)
Hemoglobin: 14.9 g/dL (ref 13.0–18.0)
MCH: 29.8 pg (ref 26.0–34.0)
MCHC: 32.3 g/dL (ref 32.0–36.0)
MCV: 92.4 fL (ref 80.0–100.0)
Platelets: 220 10*3/uL (ref 150–440)
RBC: 4.98 MIL/uL (ref 4.40–5.90)
RDW: 14.7 % — ABNORMAL HIGH (ref 11.5–14.5)
WBC: 7.2 10*3/uL (ref 3.8–10.6)

## 2015-09-17 LAB — COMPREHENSIVE METABOLIC PANEL
ALT: 30 U/L (ref 17–63)
AST: 27 U/L (ref 15–41)
Albumin: 4.6 g/dL (ref 3.5–5.0)
Alkaline Phosphatase: 66 U/L (ref 38–126)
Anion gap: 9 (ref 5–15)
BUN: 15 mg/dL (ref 6–20)
CO2: 26 mmol/L (ref 22–32)
Calcium: 9.4 mg/dL (ref 8.9–10.3)
Chloride: 104 mmol/L (ref 101–111)
Creatinine, Ser: 1.12 mg/dL (ref 0.61–1.24)
GFR calc Af Amer: >60 mL/min (ref >60)
GFR calc non Af Amer: >60 mL/min (ref >60)
Glucose, Bld: 125 mg/dL — ABNORMAL HIGH (ref 65–99)
Potassium: 4.1 mmol/L (ref 3.5–5.1)
Sodium: 139 mmol/L (ref 135–145)
Total Bilirubin: 0.6 mg/dL (ref 0.3–1.2)
Total Protein: 7.6 g/dL (ref 6.5–8.1)

## 2015-09-17 LAB — ETHANOL: Alcohol, Ethyl (B): 285 mg/dL — ABNORMAL HIGH (ref <5)

## 2015-09-17 LAB — TROPONIN I: Troponin I: <0.03 ng/mL (ref <0.031)

## 2015-09-17 NOTE — ED Notes (Addendum)
Pt made aware for need to obtain ride home due to being intoxicated beyond the legal driving limit. Pt became agitated stating he "i dont understand how that's possible i drove myself here just fine" RN spoke to MD williams, MD Jimmye Norman at bedside at this time explaining to pt the need to find ride or stay until sober. Pt still verbalizing how "he doesn't understand" Pt calm and cooperative at this time. Placed in hallway, given sandwhich tray, and instructed that he will need to stay until after 1900. Pt verbalized understanding at this time, placed in 12H, calm and cooperative at this time. No acute distress noted.

## 2015-09-17 NOTE — ED Notes (Signed)
MD Williams at bedside.  

## 2015-09-17 NOTE — ED Notes (Signed)
Pt reports drinking 1 beer today.

## 2015-09-17 NOTE — ED Provider Notes (Signed)
[facility] Emergency Department Provider Note     Time seen: ----------------------------------------- 3:31 PM on 09/17/2015 -----------------------------------------    I have reviewed the triage vital signs and the nursing notes.   HISTORY  Chief Complaint Chest Pain and Altered Mental Status    HPI Miguel [name] is a 54 y.o. male who presents ER for variety of complaints. Patient presents crying and is unable to answer questions intelligently. Patient is upset because his been drinking today and his wife does not like when he drinks. She has a history of MS and breast cancer and he is her caregiver. He states she's had several beers earlier today, is brought in a bit confused. Patient did say he had some shortness of breath earlier but denies any now.   Past Medical History  Diagnosis Date  . Hypertension   . Gout   . Renal colic     There are no active problems to display for this patient.   Past Surgical History  Procedure Laterality Date  . Cholecystectomy      Allergies Review of patient's allergies indicates no known allergies.  Social History Social History  Substance Use Topics  . Smoking status: Former Research scientist (life sciences)  . Smokeless tobacco: None  . Alcohol Use: No     Comment: occasionally    Review of Systems Constitutional: Negative for fever. Eyes: Negative for visual changes. ENT: Negative for sore throat. Cardiovascular: Negative for chest pain. Respiratory: Positive for shortness of breath Gastrointestinal: Negative for abdominal pain, vomiting and diarrhea. Genitourinary: Negative for dysuria. Musculoskeletal: Negative for back pain. Skin: Negative for rash. Neurological: Negative for headaches, focal weakness or numbness.  10-point ROS otherwise negative.  ____________________________________________   PHYSICAL EXAM:  VITAL SIGNS: ED Triage Vitals  Enc Vitals Group     BP 09/17/15 1501 132/87 mmHg     Pulse Rate  09/17/15 1501 84     Resp 09/17/15 1501 196     Temp 09/17/15 1501 98 F (36.7 C)     Temp Source 09/17/15 1501 Oral     SpO2 09/17/15 1501 97 %     Weight 09/17/15 1501 200 lb (90.719 kg)     Height 09/17/15 1501 6' (1.829 m)     Head Cir --      Peak Flow --      Pain Score 09/17/15 1502 6     Pain Loc --      Pain Edu? --      Excl. in [place]? --     Constitutional: Alert but somewhat confused or intoxicated. No acute distress is noted. Eyes: Conjunctivae are normal. PERRL. Normal extraocular movements. ENT   Head: Normocephalic and atraumatic.   Nose: No congestion/rhinnorhea.   Mouth/Throat: Mucous membranes are moist.   Neck: No stridor. Cardiovascular: Normal rate, regular rhythm. Normal and symmetric distal pulses are present in all extremities. No murmurs, rubs, or gallops. Respiratory: Normal respiratory effort without tachypnea nor retractions. Breath sounds are clear and equal bilaterally. No wheezes/rales/rhonchi. Gastrointestinal: Soft and nontender. No distention. No abdominal bruits.  Musculoskeletal: Nontender with normal range of motion in all extremities. No joint effusions.  No lower extremity tenderness nor edema. Neurologic:  Normal speech and language. No gross focal neurologic deficits are appreciated. Speech is normal. No gait instability. Skin:  Skin is warm, dry and intact. No rash noted. Psychiatric: Depressed mood and affect. ____________________________________________  EKG: Interpreted by me. Normal sinus rhythm with a rate of 85 bpm, normal PR interval,  normal QRS with, QT interval. Likely anterior infarct age indeterminate.  ____________________________________________  ED COURSE:  Pertinent labs & imaging results that were available during my care of the patient were reviewed by me and considered in my medical decision making (see chart for details). Patient's no acute distress, likely intoxicated and  depressed. ____________________________________________    LABS (pertinent positives/negatives)  Labs Reviewed  COMPREHENSIVE METABOLIC PANEL - Abnormal; Notable for the following:    Glucose, Bld 125 (*)    All other components within normal limits  CBC - Abnormal; Notable for the following:    RDW 14.7 (*)    All other components within normal limits  ETHANOL - Abnormal; Notable for the following:    Alcohol, Ethyl (B) 285 (*)    All other components within normal limits  URINE DRUG SCREEN, QUALITATIVE ([facility] ONLY) - Abnormal; Notable for the following:    Tricyclic, Ur Screen POSITIVE (*)    All other components within normal limits  TROPONIN I    RADIOLOGY Images were viewed by me  Chest x-ray IMPRESSION: Slight scarring left base. No edema or consolidation. ____________________________________________  FINAL ASSESSMENT AND PLAN  Depression, intoxication  Plan: Patient with labs and imaging as dictated above. Patient is intoxicated but is not felt to be an active threat to himself or others. He'll be stable for discharge once he sobers. Earleen Newport, MD   Earleen Newport, MD 09/17/15 [phone]

## 2015-09-17 NOTE — Discharge Instructions (Signed)
Alcohol Intoxication Alcohol intoxication occurs when you drink enough alcohol that it affects your ability to function. It can be mild or very severe. Drinking a lot of alcohol in a short time is called binge drinking. This can be very harmful. Drinking alcohol can also be more dangerous if you are taking medicines or other drugs. Some of the effects caused by alcohol may include:  Loss of coordination.  Changes in mood and behavior.  Unclear thinking.  Trouble talking (slurred speech).  Throwing up (vomiting).  Confusion.  Slowed breathing.  Twitching and shaking (seizures).  Loss of consciousness. HOME CARE  Do not drive after drinking alcohol.  Drink enough water and fluids to keep your pee (urine) clear or pale yellow. Avoid caffeine.  Only take medicine as told by your doctor. GET HELP IF:  You throw up (vomit) many times.  You do not feel better after a few days.  You frequently have alcohol intoxication. Your doctor can help decide if you should see a substance use treatment counselor. GET HELP RIGHT AWAY IF:  You become shaky when you stop drinking.  You have twitching and shaking.  You throw up blood. It may look bright red or like coffee grounds.  You notice blood in your poop (bowel movements).  You become lightheaded or pass out (faint). MAKE SURE YOU:   Understand these instructions.  Will watch your condition.  Will get help right away if you are not doing well or get worse.   This information is not intended to replace advice given to you by your health care provider. Make sure you discuss any questions you have with your health care provider.   Document Released: 04/20/2008 Document Revised: 07/05/2013 Document Reviewed: 04/07/2013 Elsevier Interactive Patient Education 2016 [employer].  Alcohol Abuse and Nutrition Alcohol abuse is any pattern of alcohol consumption that harms your health, relationships, or work. Alcohol abuse can affect  how your body breaks down and absorbs nutrients from food by causing your liver to work abnormally. Additionally, many people who abuse alcohol do not eat enough carbohydrates, protein, fat, vitamins, and minerals. This can cause poor nutrition (malnutrition) and a lack of nutrients (nutrient deficiencies), which can lead to further complications. Nutrients that are commonly lacking (deficient) among people who abuse alcohol include:  Vitamins.  Vitamin A. This is stored in your liver. It is important for your vision, metabolism, and ability to fight off infections (immunity).  B vitamins. These include vitamins such as folate, thiamin, and niacin. These are important in new cell growth and maintenance.  Vitamin C. This plays an important role in iron absorption, wound healing, and immunity.  Vitamin D. This is produced by your liver, but you can also get vitamin D from food. Vitamin D is necessary for your body to absorb and use calcium.  Minerals.  Calcium. This is important for your bones and your heart and blood vessel (cardiovascular) function.  Iron. This is important for blood, muscle, and nervous system functioning.  Magnesium. This plays an important role in muscle and nerve function, and it helps to control blood sugar and blood pressure.  Zinc. This is important for the normal function of your nervous system and digestive system (gastrointestinal tract). Nutrition is an essential component of therapy for alcohol abuse. Your health care provider or dietitian will work with you to design a plan that can help restore nutrients to your body and prevent potential complications. WHAT IS MY PLAN? Your dietitian may develop a specific  diet plan that is based on your condition and any other complications you may have. A diet plan will commonly include:  A balanced diet.  Grains: 6-8 oz per day.  Vegetables: 2-3 cups per day.  Fruits: 1-2 cups per day.  Meat and other protein: 5-6  oz per day.  Dairy: 2-3 cups per day.  Vitamin and mineral supplements. WHAT DO I NEED TO KNOW ABOUT ALCOHOL AND NUTRITION?  Consume foods that are high in antioxidants, such as grapes, berries, nuts, green tea, and dark green and orange vegetables. This can help to counteract some of the stress that is placed on your liver by consuming alcohol.  Avoid food and drinks that are high in fat and sugar. Foods such as sugared soft drinks, salty snack foods, and candy contain empty calories. This means that they lack important nutrients such as protein, fiber, and vitamins.  Eat frequent meals and snacks. Try to eat 5-6 small meals each day.  Eat a variety of fresh fruits and vegetables each day. This will help you get plenty of water, fiber, and vitamins in your diet.  Drink plenty of water and other clear fluids. Try to drink at least 48-64 oz (1.5-2 L) of water per day.  If you are a vegetarian, eat a variety of protein-rich foods. Pair whole grains with plant-based proteins at meals and snacks to obtain the greatest nutrient benefit from your food. For example, eat rice with beans, put peanut butter on whole-grain toast, or eat oatmeal with sunflower seeds.  Soak beans and whole grains overnight before cooking. This can help your body to absorb the nutrients more easily.  Include foods fortified with vitamins and minerals in your diet. Commonly fortified foods include milk, orange juice, cereal, and bread.  If you are malnourished, your dietitian may recommend a high-protein, high-calorie diet. This may include:  2,000-3,000 calories (kilocalories) per day.  70-100 grams of protein per day.  Your health care provider may recommend a complete nutritional supplement beverage. This can help to restore calories, protein, and vitamins to your body. Depending on your condition, you may be advised to consume this instead of or in addition to meals.  Limit your intake of caffeine. Replace drinks  like coffee and black tea with decaffeinated coffee and herbal tea.  Eat a variety of foods that are high in omega fatty acids. These include fish, nuts and seeds, and soybeans. These foods may help your liver to recover and may also stabilize your mood.  Certain medicines may cause changes in your appetite, taste, and weight. Work with your health care provider and dietitian to make any adjustments to your medicines and diet plan.  Include other healthy lifestyle choices in your daily routine.  Be physically active.  Get enough sleep.  Spend time doing activities that you enjoy.  If you are unable to take in enough food and calories by mouth, your health care provider may recommend a feeding tube. This is a tube that passes through your nose and throat, directly into your stomach. Nutritional supplement beverages can be given to you through the feeding tube to help you get the nutrients you need.  Take vitamin or mineral supplements as recommended by your health care provider. WHAT FOODS CAN I EAT? Grains Enriched pasta. Enriched rice. Fortified whole-grain bread. Fortified whole-grain cereal. Barley. Brown rice. Quinoa. Kingfisher. Vegetables All fresh, frozen, and canned vegetables. Spinach. Kale. Artichoke. Carrots. Winter squash and pumpkin. Sweet potatoes. Broccoli. Cabbage. Cucumbers. Tomatoes. Sweet peppers.  Green beans. Peas. Corn. Fruits All fresh and frozen fruits. Berries. Grapes. Mango. Papaya. Guava. Cherries. Apples. Bananas. Peaches. Plums. Pineapple. Watermelon. Cantaloupe. Oranges. Avocado. Meats and Other Protein Sources Beef liver. Lean beef. Pork. Fresh and canned chicken. Fresh fish. Oysters. Sardines. Canned tuna. Shrimp. Eggs with yolks. Nuts and seeds. Peanut butter. Beans and lentils. Soybeans. Tofu. Dairy Whole, low-fat, and nonfat milk. Whole, low-fat, and nonfat yogurt. Cottage cheese. Sour cream. Hard and soft cheeses. Beverages Water. Herbal tea. Decaffeinated  coffee. Decaffeinated green tea. 100% fruit juice. 100% vegetable juice. Instant breakfast shakes. Condiments Ketchup. Mayonnaise. Mustard. Salad dressing. Barbecue sauce. Sweets and Desserts Sugar-free ice cream. Sugar-free pudding. Sugar-free gelatin. Fats and Oils Butter. Vegetable oil, flaxseed oil, olive oil, and walnut oil. Other Complete nutrition shakes. Protein bars. Sugar-free gum. The items listed above may not be a complete list of recommended foods or beverages. Contact your dietitian for more options. WHAT FOODS ARE NOT RECOMMENDED? Grains Sugar-sweetened breakfast cereals. Flavored instant oatmeal. Fried breads. Vegetables Breaded or deep-fried vegetables. Fruits Dried fruit with added sugar. Candied fruit. Canned fruit in syrup. Meats and Other Protein Sources Breaded or deep-fried meats. Dairy Flavored milks. Fried cheese curds or fried cheese sticks. Beverages Alcohol. Sugar-sweetened soft drinks. Sugar-sweetened tea. Caffeinated coffee and tea. Condiments Sugar. Honey. Agave nectar. Molasses. Sweets and Desserts Chocolate. Cake. Cookies. Candy. Other Potato chips. Pretzels. Salted nuts. Candied nuts. The items listed above may not be a complete list of foods and beverages to avoid. Contact your dietitian for more information.   This information is not intended to replace advice given to you by your health care provider. Make sure you discuss any questions you have with your health care provider.   Document Released: 08/27/2005 Document Revised: 11/23/2014 Document Reviewed: 06/05/2014 Elsevier Interactive Patient Education [employer].

## 2015-09-17 NOTE — ED Notes (Signed)
Pt reports chest pain that started today, unable to describe. Pt is confused and unable to answer questions regarding pain, pt is alert and able to answer questions. Pt oddly looking around room and looking confused.

## 2015-09-17 NOTE — ED Notes (Signed)
As RN came out of another room, pt was no longer in hallway bed, RN went out into parking lot, saw pt enter red car, and sped off onto [address]. RN called Engineer, structural out front, verbalized understanding. Charge RN heather made aware and MD williams at this time, no further orders given.

## 2015-09-17 NOTE — ED Notes (Addendum)
Pt awake at this time, RN asked if pt needed anything, pt verbalized no further needs at this time, instructed can leave in 30 mins, pt verbalized understanding, no acute distress noted.

## 2015-09-18 ENCOUNTER — Encounter: Payer: Self-pay | Admitting: Urgent Care

## 2015-09-18 ENCOUNTER — Emergency Department: Payer: Self-pay

## 2015-09-18 ENCOUNTER — Other Ambulatory Visit: Payer: Self-pay

## 2015-09-18 ENCOUNTER — Emergency Department
Admission: EM | Admit: 2015-09-18 | Discharge: 2015-09-19 | Disposition: A | Discharge status: Home / Self Care | Payer: Self-pay | Attending: Emergency Medicine | Admitting: Emergency Medicine

## 2015-09-18 DIAGNOSIS — Z79899 Other long term (current) drug therapy: Secondary | ICD-10-CM | POA: Insufficient documentation

## 2015-09-18 DIAGNOSIS — Z87891 Personal history of nicotine dependence: Secondary | ICD-10-CM | POA: Insufficient documentation

## 2015-09-18 DIAGNOSIS — I10 Essential (primary) hypertension: Secondary | ICD-10-CM | POA: Insufficient documentation

## 2015-09-18 DIAGNOSIS — S3992XA Unspecified injury of lower back, initial encounter: Secondary | ICD-10-CM | POA: Insufficient documentation

## 2015-09-18 DIAGNOSIS — Z792 Long term (current) use of antibiotics: Secondary | ICD-10-CM | POA: Insufficient documentation

## 2015-09-18 DIAGNOSIS — F10120 Alcohol abuse with intoxication, uncomplicated: Secondary | ICD-10-CM | POA: Insufficient documentation

## 2015-09-18 DIAGNOSIS — W108XXA Fall (on) (from) other stairs and steps, initial encounter: Secondary | ICD-10-CM | POA: Insufficient documentation

## 2015-09-18 DIAGNOSIS — S3991XA Unspecified injury of abdomen, initial encounter: Secondary | ICD-10-CM | POA: Insufficient documentation

## 2015-09-18 DIAGNOSIS — Y998 Other external cause status: Secondary | ICD-10-CM | POA: Insufficient documentation

## 2015-09-18 DIAGNOSIS — F419 Anxiety disorder, unspecified: Secondary | ICD-10-CM | POA: Insufficient documentation

## 2015-09-18 DIAGNOSIS — Y9389 Activity, other specified: Secondary | ICD-10-CM | POA: Insufficient documentation

## 2015-09-18 DIAGNOSIS — Y9289 Other specified places as the place of occurrence of the external cause: Secondary | ICD-10-CM | POA: Insufficient documentation

## 2015-09-18 DIAGNOSIS — Z791 Long term (current) use of non-steroidal anti-inflammatories (NSAID): Secondary | ICD-10-CM | POA: Insufficient documentation

## 2015-09-18 DIAGNOSIS — J45901 Unspecified asthma with (acute) exacerbation: Secondary | ICD-10-CM | POA: Insufficient documentation

## 2015-09-18 DIAGNOSIS — F10920 Alcohol use, unspecified with intoxication, uncomplicated: Secondary | ICD-10-CM

## 2015-09-18 HISTORY — DX: Unspecified asthma, uncomplicated: J45.909

## 2015-09-18 LAB — CBC
HCT: 47.5 % (ref 40.0–52.0)
Hemoglobin: 15.7 g/dL (ref 13.0–18.0)
MCH: 30.4 pg (ref 26.0–34.0)
MCHC: 33.1 g/dL (ref 32.0–36.0)
MCV: 92 fL (ref 80.0–100.0)
Platelets: 246 10*3/uL (ref 150–440)
RBC: 5.16 MIL/uL (ref 4.40–5.90)
RDW: 15.1 % — ABNORMAL HIGH (ref 11.5–14.5)
WBC: 7.3 10*3/uL (ref 3.8–10.6)

## 2015-09-18 LAB — TROPONIN I: Troponin I: <0.03 ng/mL (ref <0.031)

## 2015-09-18 LAB — BASIC METABOLIC PANEL
Anion gap: 10 (ref 5–15)
BUN: 18 mg/dL (ref 6–20)
CO2: 26 mmol/L (ref 22–32)
Calcium: 9.4 mg/dL (ref 8.9–10.3)
Chloride: 109 mmol/L (ref 101–111)
Creatinine, Ser: 1.14 mg/dL (ref 0.61–1.24)
GFR calc Af Amer: >60 mL/min (ref >60)
GFR calc non Af Amer: >60 mL/min (ref >60)
Glucose, Bld: 128 mg/dL — ABNORMAL HIGH (ref 65–99)
Potassium: 4.5 mmol/L (ref 3.5–5.1)
Sodium: 145 mmol/L (ref 135–145)

## 2015-09-18 MED ORDER — LORAZEPAM 2 MG/ML IJ SOLN
1.0000 mg | Freq: Once | INTRAMUSCULAR | Status: AC
  Administered 2015-09-18: 1 mg via INTRAVENOUS
  Filled 2015-09-18: qty 1

## 2015-09-18 MED ORDER — KETOROLAC TROMETHAMINE 30 MG/ML IJ SOLN
30.0000 mg | Freq: Once | INTRAMUSCULAR | Status: AC
  Administered 2015-09-18: 30 mg via INTRAVENOUS
  Filled 2015-09-18: qty 1

## 2015-09-18 NOTE — ED Notes (Signed)
EKG done at this time, however tech incorrectly entered name. EKG transmitted; unable to change name; will repeat.

## 2015-09-18 NOTE — ED Provider Notes (Signed)
[facility] Emergency Department Provider Note  ____________________________________________  Time seen: 11:15 PM   I have reviewed the triage vital signs and the nursing notes.   HISTORY  Chief Complaint Fall and Shortness of Breath     HPI Miguel [name] is a 54 y.o. male presents with history of falling down approximately 3-4 steps complaining of back and abdominal pain as well as shortness of breath. Patient noted to be very anxious on presentation to the emergency department. On my arrival to the room the patient noted to be tachycardic with a heart rate 113 hypertensive as well with a blood pressure of 174/11. Patient appeared to be clinically intoxicated as such I asked him when was his last alcohol consumption which he stated that he had not drank in "a while". I asked the patient to elaborate to which he said it's been days.      Past Medical History  Diagnosis Date  . Hypertension   . Gout   . Renal colic   . Asthma     There are no active problems to display for this patient.   Past Surgical History  Procedure Laterality Date  . Cholecystectomy      Current Outpatient Rx  Name  Route  Sig  Dispense  Refill  . allopurinol (ZYLOPRIM) 100 MG tablet   Oral   Take 100 mg by mouth daily.         Marland Kitchen esomeprazole (NEXIUM) 40 MG capsule   Oral   Take 40 mg by mouth daily at 12 noon.         Marland Kitchen lisinopril (PRINIVIL,ZESTRIL) 20 MG tablet   Oral   Take 20 mg by mouth daily.         . traMADol (ULTRAM) 50 MG tablet   Oral   Take 1 tablet (50 mg total) by mouth 2 (two) times daily.   10 tablet   0   . cyclobenzaprine (FLEXERIL) 5 MG tablet   Oral   Take 1 tablet (5 mg total) by mouth 3 (three) times daily as needed for muscle spasms.   15 tablet   0   . HYDROcodone-acetaminophen (NORCO/VICODIN) 5-325 MG per tablet   Oral   Take 1 tablet by mouth every 4 (four) hours as needed.   12 tablet   0   . ketorolac (TORADOL) 10 MG  tablet   Oral   Take 1 tablet (10 mg total) by mouth every 8 (eight) hours.   15 tablet   0   . sulfamethoxazole-trimethoprim (BACTRIM DS,SEPTRA DS) 800-160 MG tablet   Oral   Take 1 tablet by mouth 2 (two) times daily.   20 tablet   0   . tamsulosin (FLOMAX) 0.4 MG CAPS capsule   Oral   Take 1 capsule (0.4 mg total) by mouth daily.   10 capsule   0     Allergies Review of patient's allergies indicates no known allergies.  No family history on file.  Social History Social History  Substance Use Topics  . Smoking status: Former Research scientist (life sciences)  . Smokeless tobacco: None  . Alcohol Use: No     Comment: occasionally    Review of Systems  Constitutional: Negative for fever. Eyes: Negative for visual changes. ENT: Negative for sore throat. Cardiovascular: Negative for chest pain. Respiratory: Negative for shortness of breath. Gastrointestinal: Negative for abdominal pain, vomiting and diarrhea. Genitourinary: Negative for dysuria. Musculoskeletal: Negative for back pain. Skin: Negative for rash. Neurological: Negative  for headaches, focal weakness or numbness. Psychiatric: Appeared clinically intoxicated  10-point ROS otherwise negative.  ____________________________________________   PHYSICAL EXAM:  VITAL SIGNS: ED Triage Vitals  Enc Vitals Group     BP 09/18/15 2123 132/108 mmHg     Pulse Rate 09/18/15 2123 132     Resp 09/18/15 2123 28     Temp 09/18/15 2123 97.8 F (36.6 C)     Temp Source 09/18/15 2123 Oral     SpO2 09/18/15 2123 96 %     Weight 09/18/15 2123 210 lb (95.255 kg)     Height 09/18/15 2123 6' (1.829 m)     Head Cir --      Peak Flow --      Pain Score 09/18/15 2124 5     Pain Loc --      Pain Edu? --      Excl. in [place]? --     Constitutional: Alert and oriented. Well appearing and in no distress EtOH noted on breath Eyes: Conjunctivae are normal. PERRL. Normal extraocular movements. ENT   Head: Normocephalic and atraumatic.    Nose: No congestion/rhinnorhea.   Mouth/Throat: Mucous membranes are moist.   Neck: No stridor. Hematological/Lymphatic/Immunilogical: No cervical lymphadenopathy. Cardiovascular: Normal rate, regular rhythm. Normal and symmetric distal pulses are present in all extremities. No murmurs, rubs, or gallops. Respiratory: Normal respiratory effort without tachypnea nor retractions. Breath sounds are clear and equal bilaterally. No wheezes/rales/rhonchi. Gastrointestinal: Soft and nontender. No distention. There is no CVA tenderness. Genitourinary: deferred Musculoskeletal: Nontender with normal range of motion in all extremities. No joint effusions.  No lower extremity tenderness nor edema. Neurologic:  Normal speech and language. No gross focal neurologic deficits are appreciated. Speech is normal.  Skin:  Skin is warm, dry and intact. No rash noted. Psychiatric: Mood and affect are normal. Speech and behavior are normal. Patient exhibits appropriate insight and judgment.  ____________________________________________    LABS (pertinent positives/negatives)  Labs Reviewed  BASIC METABOLIC PANEL - Abnormal; Notable for the following:    Glucose, Bld 128 (*)    All other components within normal limits  CBC - Abnormal; Notable for the following:    RDW 15.1 (*)    All other components within normal limits  ETHANOL - Abnormal; Notable for the following:    Alcohol, Ethyl (B) 317 (*)    All other components within normal limits  TROPONIN I       RADIOLOGY  DG Chest 2 View (Final result) Result time: 09/18/15 22:04:34   Final result by Rad Results In Interface (09/18/15 22:04:34)   Narrative:   CLINICAL DATA: [place] down 3 or 4 steps tonight, now with back and abdominal pain. Dyspnea.  EXAM: CHEST 2 VIEW  COMPARISON: 09/17/2015  FINDINGS: The lungs are clear except for mild linear scarring in the lateral left base. Heart size is normal. Pulmonary vasculature is  normal. Hilar and mediastinal contours are unremarkable unchanged. There are no pleural effusions.  IMPRESSION: No active cardiopulmonary disease.   Electronically Signed By: Andreas Newport M.D. On: 09/18/2015 22:04       INITIAL IMPRESSION / ASSESSMENT AND PLAN / ED COURSE  Pertinent labs & imaging results that were available during my care of the patient were reviewed by me and considered in my medical decision making (see chart for details).  I reviewed the patient's last emergency department visit which revealed that the patient stated that he only drank one beer but was markedly intoxicated and attempted to  elope from the emergency department driving his vehicle . I informed the patient that he was indeed intoxicated today would not call level [place] and advised that he either one call someone to give him a ride home or you have to stay in the emergency department approximately 5 or 6 AM at which point he would reach bright 80. ----------------------------------------- 1:24 AM on 09/19/2015 -----------------------------------------  Despite the fact that I told the patient he needed to stay in the hospital until he was sober the patient attempted to leave the emergency department and drive himself home much like he did during his last emergency department visit.  ____________________________________________   FINAL CLINICAL IMPRESSION(S) / ED DIAGNOSES  Final diagnoses:  Alcohol intoxication, uncomplicated (HCC)      Gregor Hams, MD 09/19/15 [phone]

## 2015-09-18 NOTE — ED Notes (Signed)
Patient presents s/p fall down 3-4 steps. Patient with c/p back and abd pain. (+) SOB reported. Patient anxious.

## 2015-09-19 LAB — ETHANOL: Alcohol, Ethyl (B): 317 mg/dL (ref <5)

## 2015-09-19 NOTE — ED Notes (Signed)
Pt attempted to leave while still intoxicated against medical advice.  Pt was told by staff that he is too intoxicated to drive and could only leave if he had someone to pick him up.  Pt stated he did not have any other means of transportation and could not afford a cab.  Police called to hospital entrance where patient was administered  A breathalizer test by police.  Pt proceeded to give an inadequate sample, but resulted as "high".  Patient willingly returned to his room and told by hospital staff to wait until he is sober before he drives.  Police have been notified of patient location.

## 2015-09-19 NOTE — Discharge Instructions (Signed)
Alcohol Intoxication  Alcohol intoxication occurs when the amount of alcohol that a person has consumed impairs his or her ability to mentally and physically function. Alcohol directly impairs the normal chemical activity of the brain. Drinking large amounts of alcohol can lead to changes in mental function and behavior, and it can cause many physical effects that can be harmful.   Alcohol intoxication can range in severity from mild to very severe. Various factors can affect the level of intoxication that occurs, such as the person's age, gender, weight, frequency of alcohol consumption, and the presence of other medical conditions (such as diabetes, seizures, or heart conditions). Dangerous levels of alcohol intoxication may occur when people drink large amounts of alcohol in a short period (binge drinking). Alcohol can also be especially dangerous when combined with certain prescription medicines or "recreational" drugs.  SIGNS AND SYMPTOMS  Some common signs and symptoms of mild alcohol intoxication include:  · Loss of coordination.  · Changes in mood and behavior.  · Impaired judgment.  · Slurred speech.  As alcohol intoxication progresses to more severe levels, other signs and symptoms will appear. These may include:  · Vomiting.  · Confusion and impaired memory.  · Slowed breathing.  · Seizures.  · Loss of consciousness.  DIAGNOSIS   Your health care provider will take a medical history and perform a physical exam. You will be asked about the amount and type of alcohol you have consumed. Blood tests will be done to measure the concentration of alcohol in your blood. In many places, your blood alcohol level must be lower than 80 mg/dL (0.08%) to legally drive. However, many dangerous effects of alcohol can occur at much lower levels.   TREATMENT   People with alcohol intoxication often do not require treatment. Most of the effects of alcohol intoxication are temporary, and they go away as the alcohol naturally  leaves the body. Your health care provider will monitor your condition until you are stable enough to go home. Fluids are sometimes given through an IV access tube to help prevent dehydration.   HOME CARE INSTRUCTIONS  · Do not drive after drinking alcohol.  · Stay hydrated. Drink enough water and fluids to keep your urine clear or pale yellow. Avoid caffeine.    · Only take over-the-counter or prescription medicines as directed by your health care provider.    SEEK MEDICAL CARE IF:   · You have persistent vomiting.    · You do not feel better after a few days.  · You have frequent alcohol intoxication. Your health care provider can help determine if you should see a substance use treatment counselor.  SEEK IMMEDIATE MEDICAL CARE IF:   · You become shaky or tremble when you try to stop drinking.    · You shake uncontrollably (seizure).    · You throw up (vomit) blood. This may be bright red or may look like black coffee grounds.    · You have blood in your stool. This may be bright red or may appear as a black, tarry, bad smelling stool.    · You become lightheaded or faint.    MAKE SURE YOU:   · Understand these instructions.  · Will watch your condition.  · Will get help right away if you are not doing well or get worse.     This information is not intended to replace advice given to you by your health care provider. Make sure you discuss any questions you have with your health care provider.       Document Released: 08/12/2005 Document Revised: 07/05/2013 Document Reviewed: 04/07/2013  Elsevier Interactive Patient Education ©2016 [employer]

## 2015-09-19 NOTE — ED Notes (Signed)
Pt discharged to home via self transport.  Pt otherwise stable and medically cleared for discharge.  Paperwork reviewed with patient.  Pt verbalized understanding

## 2015-09-26 IMAGING — CR DG LUMBAR SPINE 2-3V
1 series · 3 of 3 positions shown · non-contrast
Comparison: CT of the abdomen and pelvis from 03/30/2014

CLINICAL DATA: Status post fall from standing into bathtub. Lower
back pain. Initial encounter.

EXAM:
LUMBAR SPINE - 2-3 VIEW

[Series 1: t lumbar spine ap · 0.14mm/px · 3 of 3 slices shown]
[im 1/3]
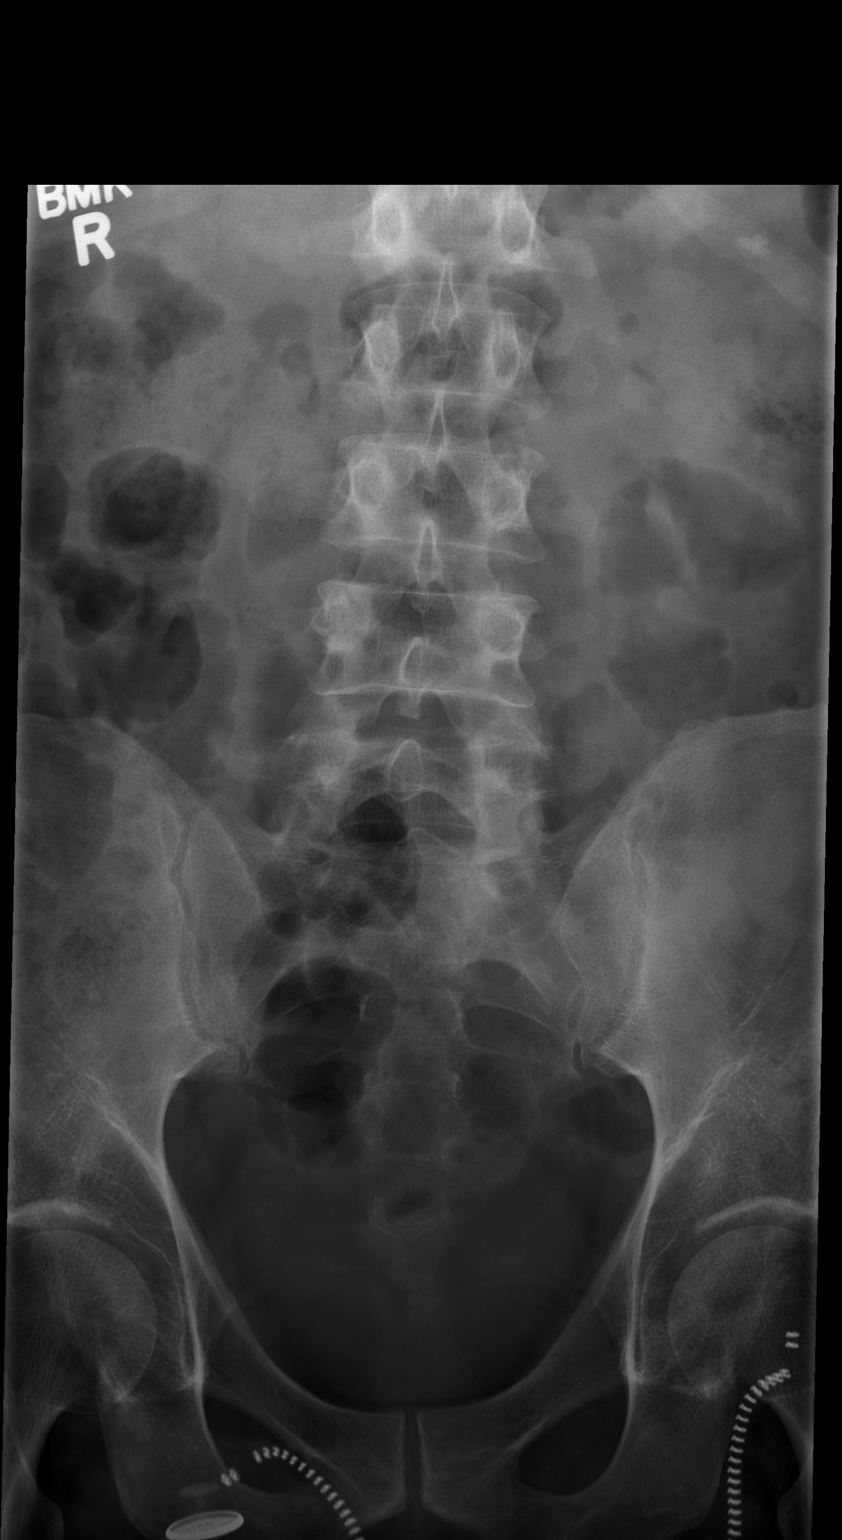
[im 2/3]
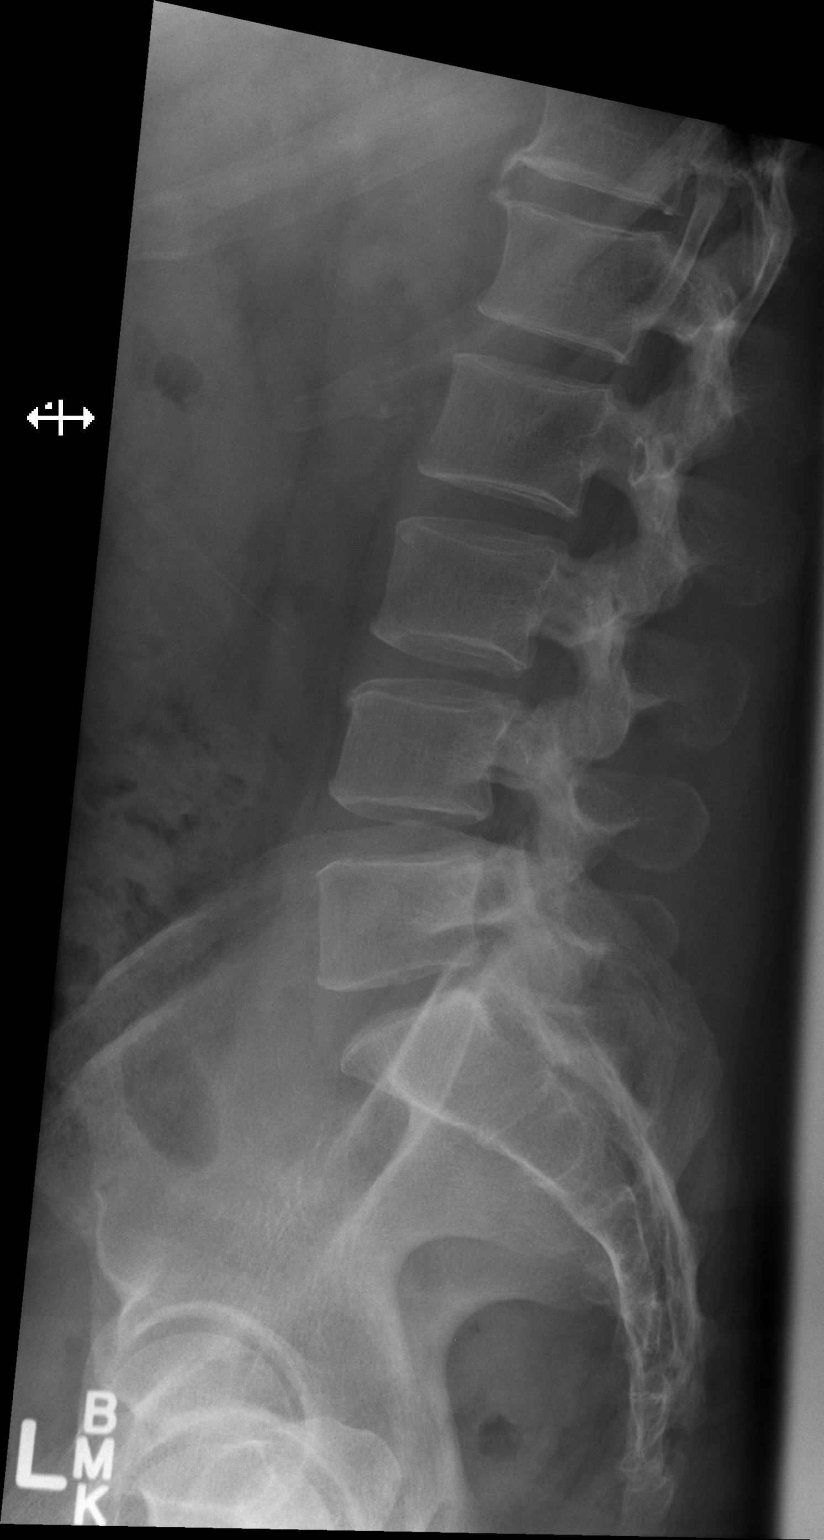
[im 3/3]
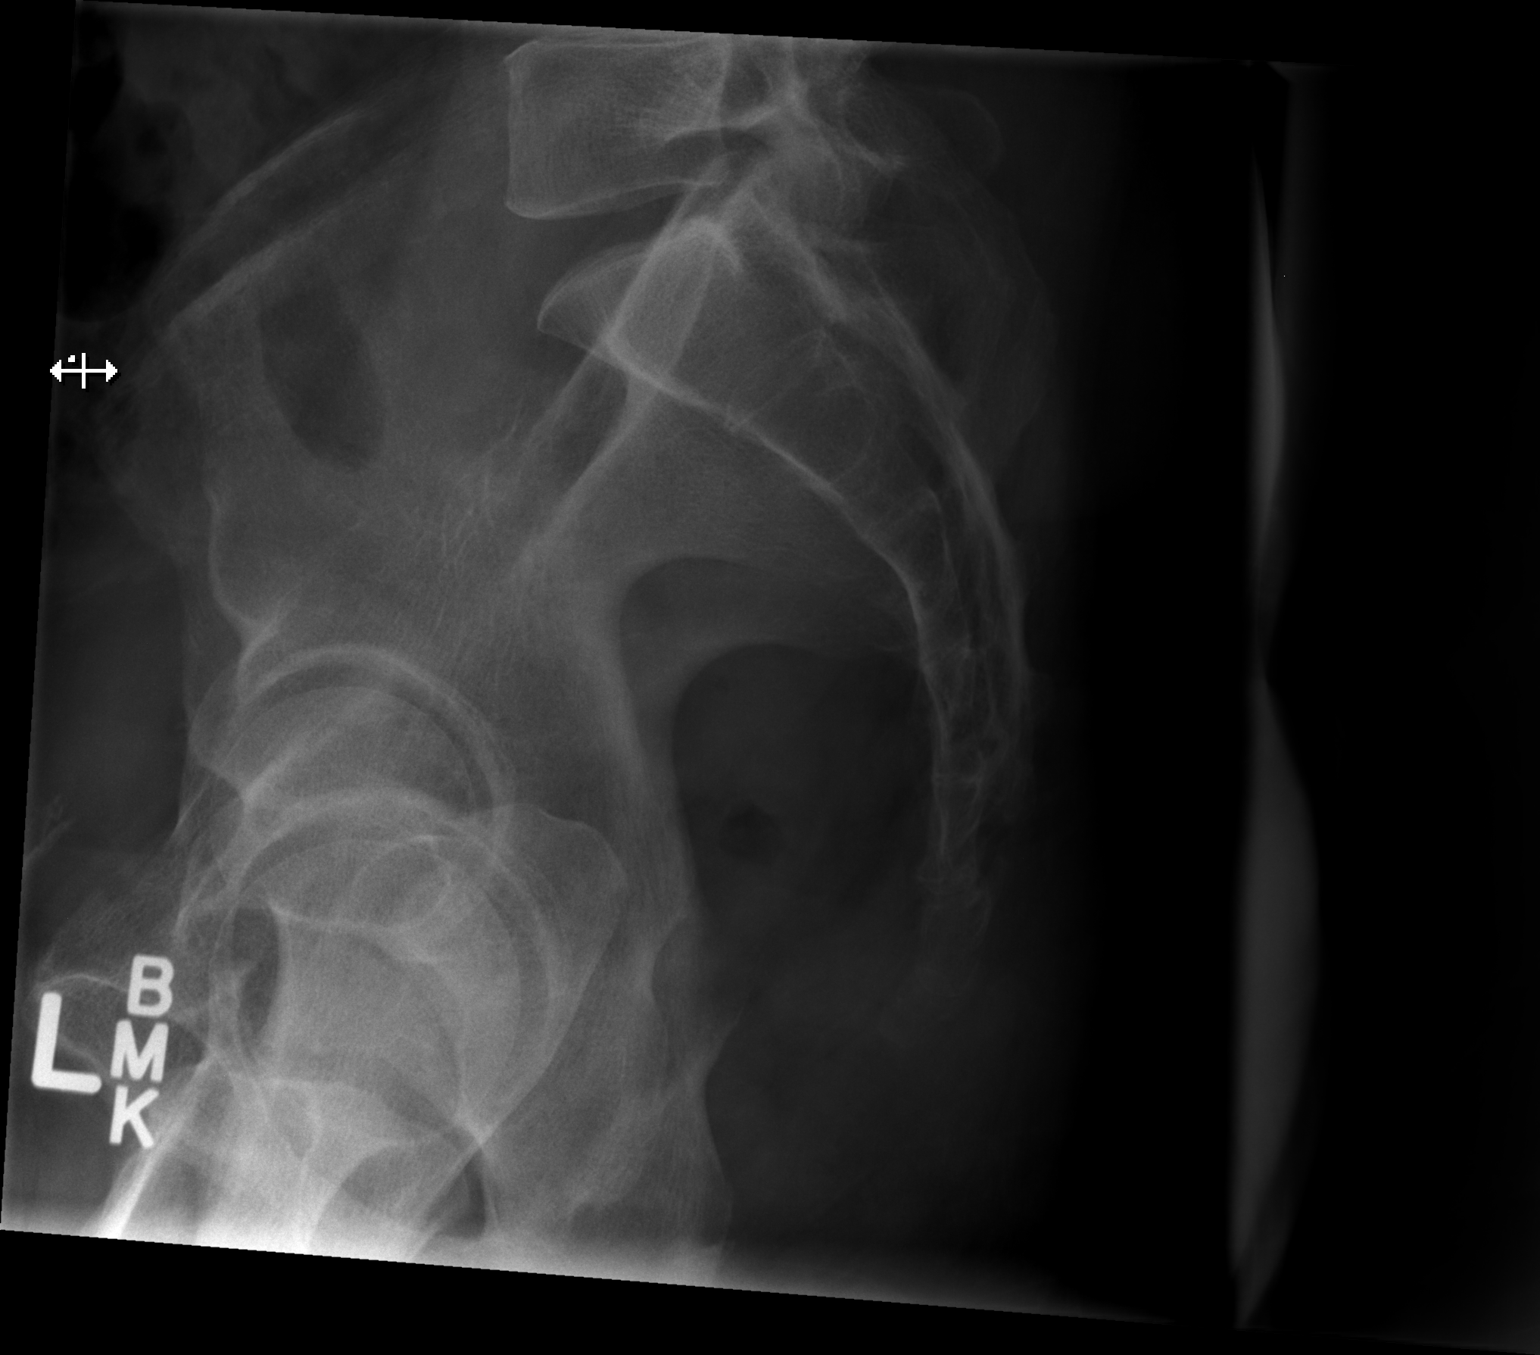

[3 of 3 positions shown; findings below may reference images not displayed]

FINDINGS: There is no evidence of fracture or subluxation. Vertebral bodies
demonstrate normal height and alignment. Intervertebral disc spaces
are preserved. The visualized neural foramina are grossly
unremarkable in appearance.

The visualized bowel gas pattern is unremarkable in appearance; air
and stool are noted within the colon. The sacroiliac joints are
within normal limits.
IMPRESSION: No evidence of fracture or subluxation along the lumbar spine.

## 2015-10-03 ENCOUNTER — Encounter: Payer: Self-pay | Admitting: Pharmacist

## 2015-10-03 ENCOUNTER — Ambulatory Visit: Payer: Self-pay

## 2015-10-14 IMAGING — CR DG ABDOMEN 1V
1 series · 1 of 1 positions shown · non-contrast
Comparison: Abdominal CT 03/30/2014

CLINICAL DATA: Right-sided pain and hematuria. History of kidney
stones

EXAM:
ABDOMEN - 1 VIEW

[dxr kidney ureter bladder]
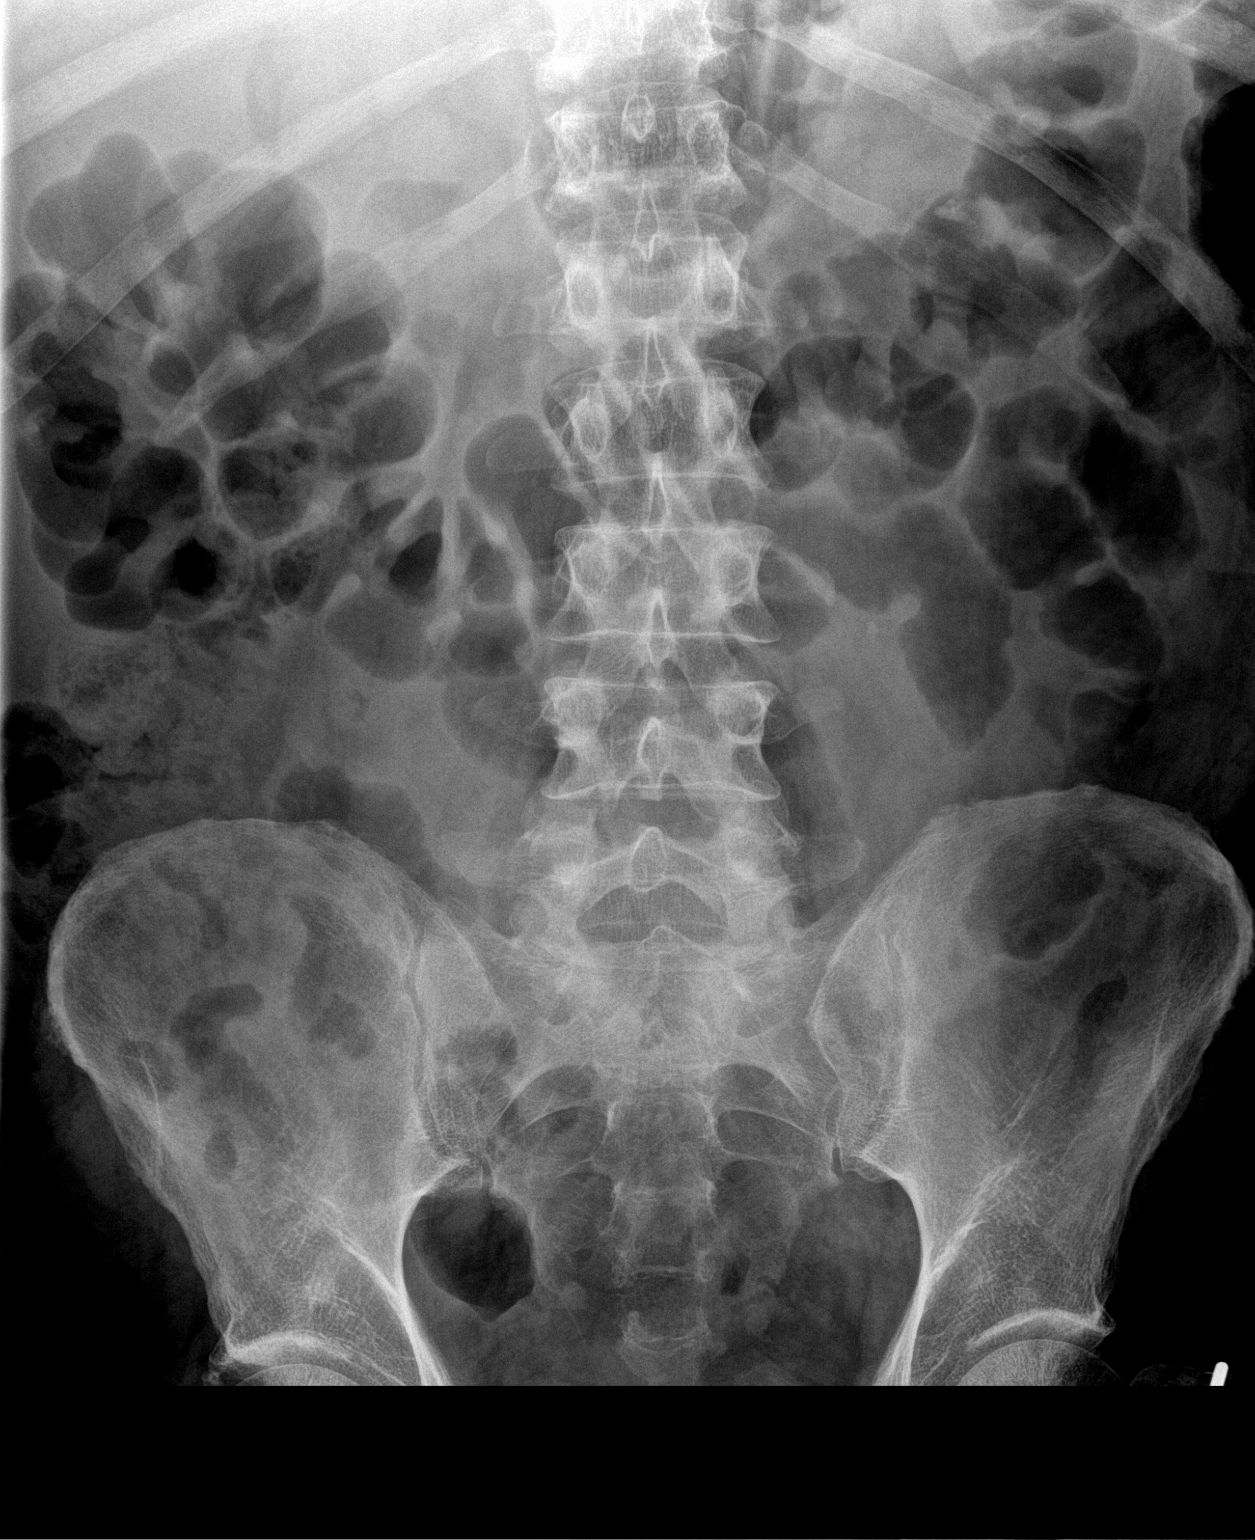

[1 of 1 positions shown; findings below may reference images not displayed]

FINDINGS: No right-sided urolithiasis confidently identified to explain right
flank pain. There is a 10 x 6 mm stone overlapping the left kidney
which is known from comparison CT. There is a in 4 mm stone
overlapping the left flank, at the level of the L3 vertebra, which
could be ureteral, although does not correlate with the reported
symptoms. No evidence of bowel obstruction or acute osseous
abnormality.
IMPRESSION: 1. 4 mm stone at the level of the left mid ureter.
2. No definite right-sided urolithiasis to explain right flank pain.
3. 10 mm left renal calculus.

## 2015-10-24 ENCOUNTER — Encounter: Payer: Self-pay | Admitting: Pharmacist

## 2015-10-28 ENCOUNTER — Encounter: Payer: Self-pay | Admitting: Pharmacist

## 2015-11-13 ENCOUNTER — Ambulatory Visit: Payer: Self-pay | Admitting: Internal Medicine

## 2015-11-13 DIAGNOSIS — M109 Gout, unspecified: Secondary | ICD-10-CM | POA: Insufficient documentation

## 2015-11-16 IMAGING — CR RIGHT FOOT COMPLETE - 3+ VIEW
1 series · 3 of 3 positions shown · non-contrast
Comparison: 12/14/2013

CLINICAL DATA: No known injury, lateral pain and swelling

EXAM:
RIGHT FOOT COMPLETE - 3+ VIEW

[Series 1: dxr foot rt complete w/obliques · 0.14mm/px · 3 of 3 slices shown]
[im 1/3]
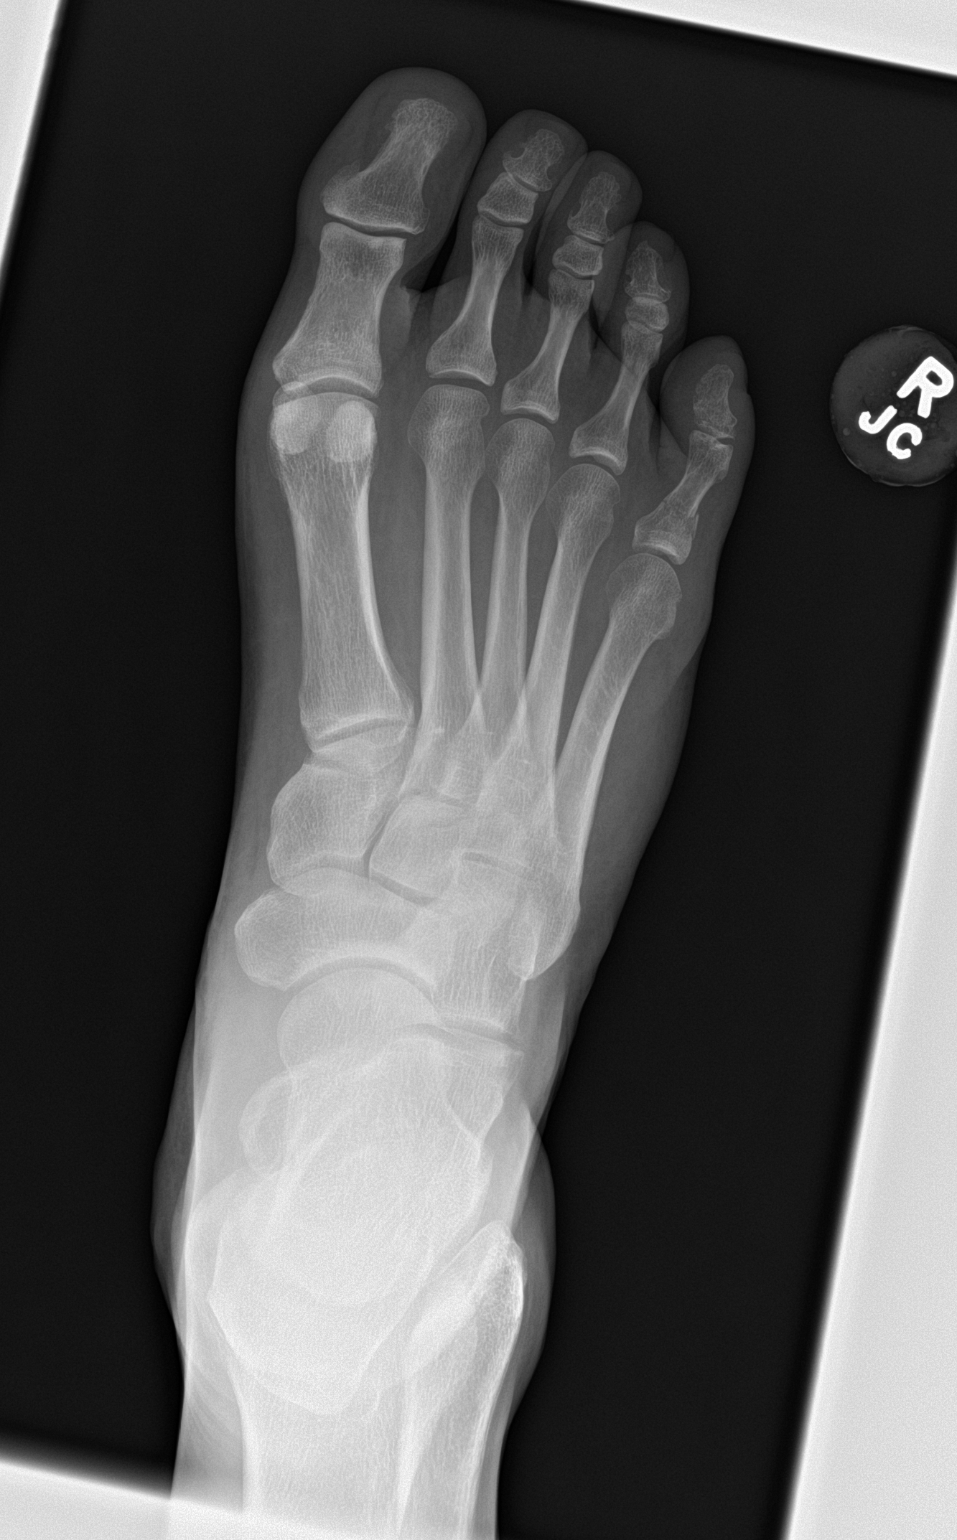
[im 2/3]
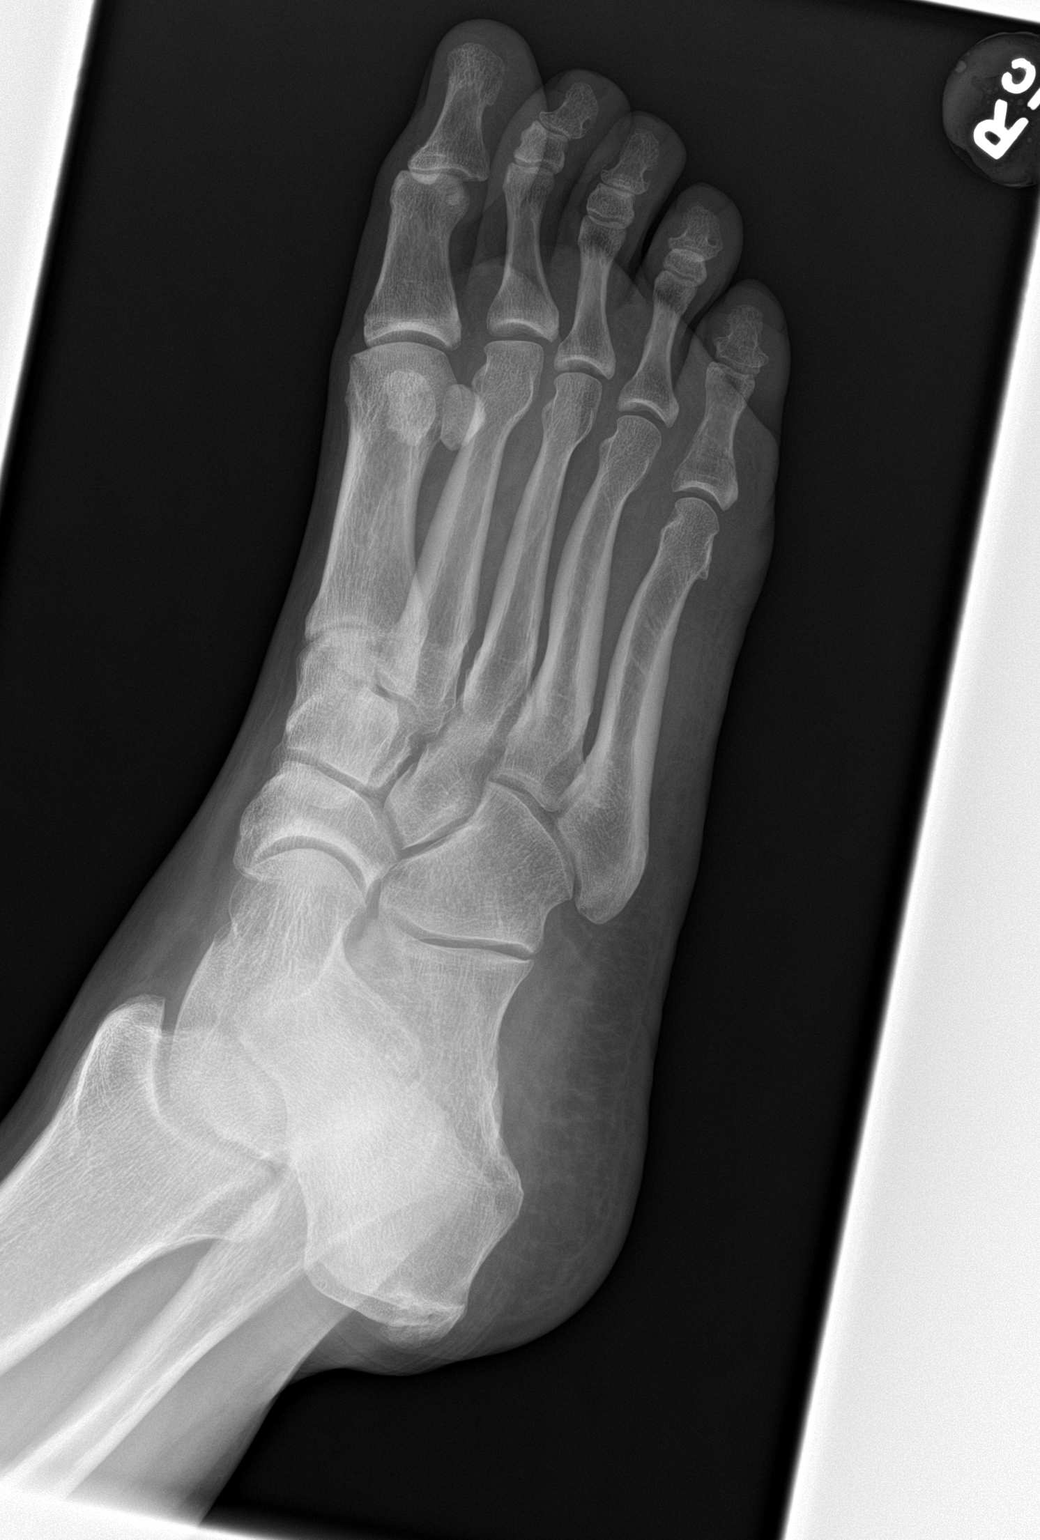
[im 3/3]
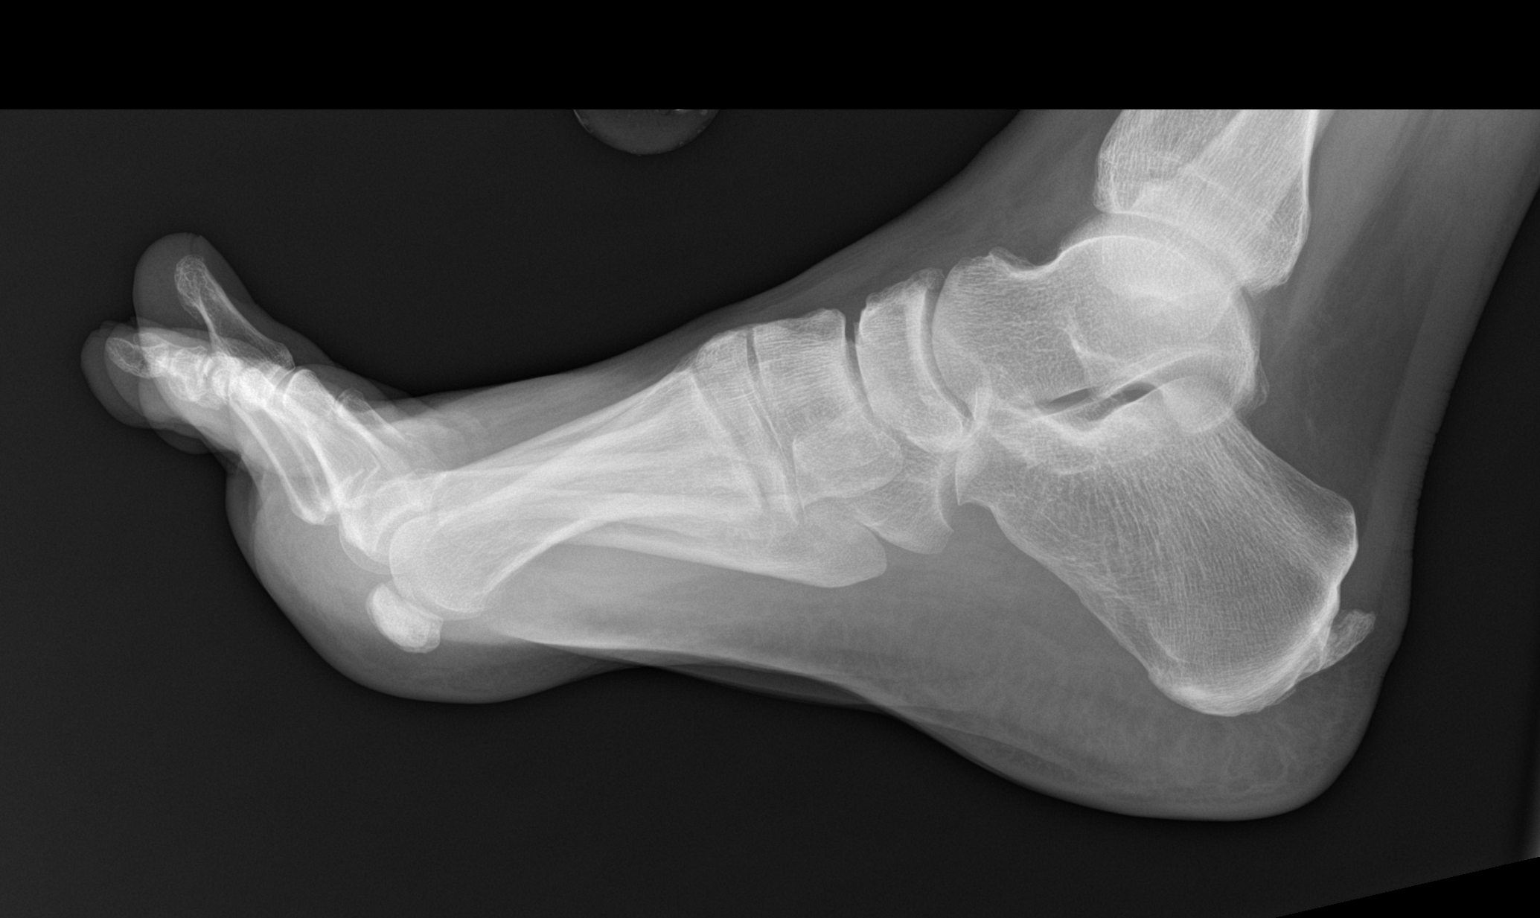

[3 of 3 positions shown; findings below may reference images not displayed]

FINDINGS: Three views of the right foot submitted. Minimal displaced fracture
proximal shaft proximal phalanx fifth toe. Large posterior spur of
calcaneus.
IMPRESSION: Minimal displaced fracture proximal phalanx fifth toe. Large
posterior spur of calcaneus.

## 2015-11-28 ENCOUNTER — Other Ambulatory Visit: Payer: Self-pay

## 2015-12-03 ENCOUNTER — Other Ambulatory Visit: Payer: Self-pay

## 2015-12-03 LAB — BASIC METABOLIC PANEL
BUN: 10 mg/dL (ref 4–21)
Creatinine: 1 mg/dL (ref 0.6–1.3)
Glucose: 132 mg/dL
Sodium: 138 mmol/L (ref 137–147)

## 2015-12-05 ENCOUNTER — Ambulatory Visit: Payer: Self-pay

## 2015-12-05 DIAGNOSIS — I10 Essential (primary) hypertension: Secondary | ICD-10-CM | POA: Insufficient documentation

## 2015-12-05 DIAGNOSIS — R000 Tachycardia, unspecified: Secondary | ICD-10-CM | POA: Insufficient documentation

## 2015-12-19 ENCOUNTER — Ambulatory Visit: Payer: Self-pay

## 2015-12-20 ENCOUNTER — Emergency Department: Payer: Self-pay

## 2015-12-20 ENCOUNTER — Emergency Department
Admission: EM | Admit: 2015-12-20 | Discharge: 2015-12-21 | Disposition: A | Discharge status: Home / Self Care | Payer: Self-pay | Attending: Emergency Medicine | Admitting: Emergency Medicine

## 2015-12-20 DIAGNOSIS — Z79899 Other long term (current) drug therapy: Secondary | ICD-10-CM | POA: Insufficient documentation

## 2015-12-20 DIAGNOSIS — Z792 Long term (current) use of antibiotics: Secondary | ICD-10-CM | POA: Insufficient documentation

## 2015-12-20 DIAGNOSIS — R2242 Localized swelling, mass and lump, left lower limb: Secondary | ICD-10-CM | POA: Insufficient documentation

## 2015-12-20 DIAGNOSIS — Z87891 Personal history of nicotine dependence: Secondary | ICD-10-CM | POA: Insufficient documentation

## 2015-12-20 DIAGNOSIS — I159 Secondary hypertension, unspecified: Secondary | ICD-10-CM

## 2015-12-20 DIAGNOSIS — M25572 Pain in left ankle and joints of left foot: Secondary | ICD-10-CM

## 2015-12-20 DIAGNOSIS — R300 Dysuria: Secondary | ICD-10-CM

## 2015-12-20 DIAGNOSIS — F10120 Alcohol abuse with intoxication, uncomplicated: Secondary | ICD-10-CM | POA: Insufficient documentation

## 2015-12-20 DIAGNOSIS — R0602 Shortness of breath: Secondary | ICD-10-CM

## 2015-12-20 DIAGNOSIS — F10920 Alcohol use, unspecified with intoxication, uncomplicated: Secondary | ICD-10-CM

## 2015-12-20 DIAGNOSIS — J45901 Unspecified asthma with (acute) exacerbation: Secondary | ICD-10-CM | POA: Insufficient documentation

## 2015-12-20 LAB — URINALYSIS COMPLETE WITH MICROSCOPIC (ARMC ONLY)
Bilirubin Urine: NEGATIVE
Glucose, UA: NEGATIVE mg/dL
Hgb urine dipstick: NEGATIVE
Ketones, ur: NEGATIVE mg/dL
Nitrite: NEGATIVE
Protein, ur: 30 mg/dL — AB
Specific Gravity, Urine: 1.015 (ref 1.005–1.030)
pH: 6 (ref 5.0–8.0)

## 2015-12-20 LAB — BASIC METABOLIC PANEL
Anion gap: 10 (ref 5–15)
BUN: 9 mg/dL (ref 6–20)
CO2: 29 mmol/L (ref 22–32)
Calcium: 9.1 mg/dL (ref 8.9–10.3)
Chloride: 105 mmol/L (ref 101–111)
Creatinine, Ser: 1.02 mg/dL (ref 0.61–1.24)
GFR calc Af Amer: >60 mL/min (ref >60)
GFR calc non Af Amer: >60 mL/min (ref >60)
Glucose, Bld: 101 mg/dL — ABNORMAL HIGH (ref 65–99)
Potassium: 3.6 mmol/L (ref 3.5–5.1)
Sodium: 144 mmol/L (ref 135–145)

## 2015-12-20 LAB — CBC
HCT: 49.2 % (ref 40.0–52.0)
Hemoglobin: 16.3 g/dL (ref 13.0–18.0)
MCH: 31.1 pg (ref 26.0–34.0)
MCHC: 33.1 g/dL (ref 32.0–36.0)
MCV: 94.1 fL (ref 80.0–100.0)
Platelets: 219 10*3/uL (ref 150–440)
RBC: 5.23 MIL/uL (ref 4.40–5.90)
RDW: 14.5 % (ref 11.5–14.5)
WBC: 5.8 10*3/uL (ref 3.8–10.6)

## 2015-12-20 LAB — URIC ACID: Uric Acid, Serum: 6.9 mg/dL (ref 4.4–7.6)

## 2015-12-20 LAB — ETHANOL: Alcohol, Ethyl (B): 293 mg/dL — ABNORMAL HIGH (ref <5)

## 2015-12-20 MED ORDER — IPRATROPIUM-ALBUTEROL 0.5-2.5 (3) MG/3ML IN SOLN
3.0000 mL | Freq: Once | RESPIRATORY_TRACT | Status: AC
  Administered 2015-12-20: 3 mL via RESPIRATORY_TRACT

## 2015-12-20 MED ORDER — DEXAMETHASONE SODIUM PHOSPHATE 10 MG/ML IJ SOLN
10.0000 mg | Freq: Once | INTRAMUSCULAR | Status: AC
  Administered 2015-12-20: 10 mg via INTRAVENOUS
  Filled 2015-12-20: qty 1

## 2015-12-20 MED ORDER — SODIUM CHLORIDE 0.9 % IV BOLUS (SEPSIS)
1000.0000 mL | Freq: Once | INTRAVENOUS | Status: AC
  Administered 2015-12-20: 1000 mL via INTRAVENOUS

## 2015-12-20 MED ORDER — IPRATROPIUM-ALBUTEROL 0.5-2.5 (3) MG/3ML IN SOLN
RESPIRATORY_TRACT | Status: AC
  Filled 2015-12-20: qty 3

## 2015-12-20 MED ORDER — ACETAMINOPHEN 500 MG PO TABS
ORAL_TABLET | ORAL | Status: AC
  Filled 2015-12-20: qty 2

## 2015-12-20 MED ORDER — HYDROMORPHONE HCL 1 MG/ML IJ SOLN
1.0000 mg | Freq: Once | INTRAMUSCULAR | Status: AC
  Administered 2015-12-20: 1 mg via INTRAVENOUS
  Filled 2015-12-20: qty 1

## 2015-12-20 MED ORDER — ACETAMINOPHEN 500 MG PO TABS
1000.0000 mg | ORAL_TABLET | Freq: Once | ORAL | Status: AC
  Administered 2015-12-20: 1000 mg via ORAL

## 2015-12-20 NOTE — ED Notes (Signed)
Pt watching tv in no acute distress. resps unlabored. Pt states pain is not improved after dilaudid administration.

## 2015-12-20 NOTE — ED Notes (Signed)
Miguel gaines, pa notified of pt's continued hypertension and tachycardia. Pa in to reassess pt.

## 2015-12-20 NOTE — ED Notes (Signed)
Report to kendall, rn.

## 2015-12-20 NOTE — ED Notes (Signed)
Pt states started cholchicine for gout yesterday. Pt states since starting medication has had nausea, vomiting, diarrhea. Pt staets "i just feel sick".

## 2015-12-20 NOTE — ED Notes (Signed)
Pt states 'i have burning when i go you know." pt provided with urinal for sample, will notify md.

## 2015-12-20 NOTE — ED Notes (Signed)
Pt requesting more pain medication. Pa notified.

## 2015-12-20 NOTE — ED Notes (Signed)
Pt with hr of 110 after breathing treatment, resps 20.

## 2015-12-20 NOTE — ED Provider Notes (Signed)
CSN: [id number]     Arrival date & time 12/20/15  1958 History   First MD Initiated Contact with Patient 12/20/15 2032     Chief Complaint  Patient presents with  . Foot Pain  . Diarrhea  . Emesis     (Consider location/radiation/quality/duration/timing/severity/associated sxs/prior Treatment) Patient is a 55 y.o. male presenting with diarrhea and vomiting.  Diarrhea Associated symptoms: vomiting   Emesis Associated symptoms: no diarrhea     55 year old male with history of gout presents to the emergency department for evaluation of left ankle pain. Patient states one day ago he developed severe 10 out of 10 pain along the lateral aspect of the ankle. He describes severe pain with light touch, difficulty ambulating. No trauma or injury. Patient takes allopurinol daily, yesterday started culture seen. Developed 3 episodes of nonbilious or bloody vomiting and one episode of diarrhea over the last 24 hours. Patient currently without abdominal pain, nausea. Tolerating by mouth well. Patient is to drinking 2 beers earlier this afternoon. Denies any suicidal or homicidal ideations.  Patient also notes he experienced episode of dysuria yesterday. He denies any increase or decrease in urinary frequency. No abdominal, testicular pain. Patient able to void today with no issues with flow.   Patient with shortness of breath that began upon arrival to the emergency department. He states he has a history of COPD, has not used his inhaler. Denies any chest pain, recent surgery, history of blood clots, long car rides, hemoptysis, history of cancer/malignancy.   Past Medical History  Diagnosis Date  . Hypertension   . Gout   . Renal colic   . Asthma    Past Surgical History  Procedure Laterality Date  . Cholecystectomy     No family history on file. Social History  Substance Use Topics  . Smoking status: Former Research scientist (life sciences)  . Smokeless tobacco: Not on file  . Alcohol Use: No     Comment:  occasionally    Review of Systems  Constitutional: Negative.  Negative for activity change and appetite change.  HENT: Negative for ear pain, mouth sores, rhinorrhea, sinus pressure and trouble swallowing.   Eyes: Negative for photophobia, pain and discharge.  Respiratory: Positive for shortness of breath. Negative for chest tightness.   Cardiovascular: Negative for leg swelling.  Gastrointestinal: Positive for vomiting. Negative for diarrhea and abdominal distention.  Genitourinary: Negative for dysuria and difficulty urinating.  Musculoskeletal: Negative for back pain and gait problem.  Skin: Negative for color change.  Neurological: Negative for dizziness.  Hematological: Negative for adenopathy.  Psychiatric/Behavioral: Negative for behavioral problems and agitation.      Allergies  Review of patient's allergies indicates no known allergies.  Home Medications   Prior to Admission medications   Medication Sig Start Date End Date Taking? Authorizing Provider  allopurinol (ZYLOPRIM) 100 MG tablet Take 100 mg by mouth daily.    Historical Provider, MD  cyclobenzaprine (FLEXERIL) 5 MG tablet Take 1 tablet (5 mg total) by mouth 3 (three) times daily as needed for muscle spasms. 03/25/15   Jenise V Bacon Menshew, PA-C  esomeprazole (NEXIUM) 40 MG capsule Take 40 mg by mouth daily at 12 noon.    Historical Provider, MD  HYDROcodone-acetaminophen (NORCO/VICODIN) 5-325 MG per tablet Take 1 tablet by mouth every 4 (four) hours as needed. 06/13/15   Victorino Dike, FNP  ketorolac (TORADOL) 10 MG tablet Take 1 tablet (10 mg total) by mouth every 8 (eight) hours. 05/16/15   Gregor Hams, MD  lisinopril (PRINIVIL,ZESTRIL) 20 MG tablet Take 20 mg by mouth daily.    Historical Provider, MD  sulfamethoxazole-trimethoprim (BACTRIM DS,SEPTRA DS) 800-160 MG tablet Take 1 tablet by mouth 2 (two) times daily. 09/02/15   Jenise V Bacon Menshew, PA-C  tamsulosin (FLOMAX) 0.4 MG CAPS capsule Take 1  capsule (0.4 mg total) by mouth daily. 05/16/15   Nance Pear, MD  traMADol (ULTRAM) 50 MG tablet Take 1 tablet (50 mg total) by mouth 2 (two) times daily. 09/02/15   Jenise V Bacon Menshew, PA-C   BP 197/114 mmHg  Pulse 123  Temp(Src) 99 F (37.2 C) (Oral)  Resp 20  Ht 6' (1.829 m)  Wt 90.719 kg  BMI 27.12 kg/m2  SpO2 96% Physical Exam  Constitutional: He is oriented to person, place, and time. He appears well-developed and well-nourished.  HENT:  Head: Normocephalic and atraumatic.  Right Ear: External ear normal.  Left Ear: External ear normal.  Nose: Nose normal.  Mouth/Throat: Oropharynx is clear and moist. No oropharyngeal exudate.  Eyes: Conjunctivae and EOM are normal. Pupils are equal, round, and reactive to light. Right eye exhibits no discharge. Left eye exhibits no discharge.  Neck: Normal range of motion. Neck supple.  Cardiovascular: Regular rhythm, normal heart sounds and intact distal pulses.   Pulmonary/Chest: Effort normal. No stridor. No respiratory distress. He has wheezes (faint expiratory wheeze). He has no rales. He exhibits no tenderness.  Abdominal: Soft. Bowel sounds are normal. He exhibits no distension and no mass. There is no tenderness. There is no rebound and no guarding.  Musculoskeletal:  Examination of the lower extremities shows patient has no soft tissue swelling or edema bilaterally. Negative Homans sign bilaterally.  Left ankle with mild tenderness to palpation over the lateral malleolus. No warmth or erythema. Mild soft tissue swelling present. No skin breakdown or lesions noted. Normal active ankle plantarflexion and dorsiflexion present. 2+ dorsalis pedis pulse.  Lymphadenopathy:    He has no cervical adenopathy.  Neurological: He is alert and oriented to person, place, and time.  Skin: Skin is warm and dry. No rash noted.  Psychiatric: He has a normal mood and affect. His behavior is normal. Judgment and thought content normal.  Nursing  note and vitals reviewed.   ED Course  Procedures (including critical care time) Labs Review Labs Reviewed  BASIC METABOLIC PANEL - Abnormal; Notable for the following:    Glucose, Bld 101 (*)    All other components within normal limits  URINALYSIS COMPLETEWITH MICROSCOPIC ([facility] ONLY) - Abnormal; Notable for the following:    Color, Urine YELLOW (*)    APPearance CLEAR (*)    Protein, ur 30 (*)    Leukocytes, UA TRACE (*)    Bacteria, UA RARE (*)    Squamous Epithelial / LPF 0-5 (*)    All other components within normal limits  ETHANOL - Abnormal; Notable for the following:    Alcohol, Ethyl (B) 293 (*)    All other components within normal limits  CBC  URIC ACID  TROPONIN I    Imaging Review Dg Chest 2 View  12/20/2015  CLINICAL DATA:  Shortness of breath, onset 1 hour prior. EXAM: CHEST  2 VIEW COMPARISON:  Radiograph 09/18/2015.  Chest CT 06/10/2015 FINDINGS: The cardiomediastinal contours are normal. Chronic lingular atelectasis or scarring is unchanged. Pulmonary vasculature is normal. No consolidation, pleural effusion, or pneumothorax. No acute osseous abnormalities are seen. IMPRESSION: No acute pulmonary process. Electronically Signed   By: Fonnie Birkenhead.D.  On: 12/20/2015 22:58   Dg Ankle Complete Left  12/20/2015  CLINICAL DATA:  Patient with left lateral ankle pain and swelling for 5 days. History of gout. EXAM: LEFT ANKLE COMPLETE - 3+ VIEW COMPARISON:  Ankle radiograph 10/31/2013 FINDINGS: Normal anatomic alignment. No evidence for acute fracture or dislocation. Midfoot degenerative changes. Tailor dome is intact. Mild soft tissue swelling about the lateral malleolus. No evidence for acute osseous erosion. Unchanged likely chronic osseous fragments about the lateral malleolus. IMPRESSION: Mild soft tissue swelling about the lateral malleolus. Infectious process is not excluded. Electronically Signed   By: Lovey Newcomer M.D.   On: 12/20/2015 22:06   I have personally  reviewed and evaluated these images and lab results as part of my medical decision-making.   EKG Interpretation None      MDM   Final diagnoses:  Left ankle pain  Shortness of breath  Dysuria  Alcohol intoxication, uncomplicated ([place])   55 year old male with history of hypertension, gout, COPD presents to the emergency department with left ankle pain resembling gout flare. IV started, 1 mg of Dilaudid IV given along with 10 mg of dexamethasone IV. Pain improved from 10/10 to 7 out of 10. Uric acid within normal limits. CBC shows no white count. X-ray show no evidence of acute bony abnormality, mild soft tissue swelling. No warmth, erythema on exam indicating no soft tissue infection.  Patient later with complaints of shortness of breath. Minimal expiratory wheezing on exam. DuoNeb treatment given. Shortness of breath improved. Chest x-ray negative for any acute pulmonary process.  Patient with continued hypertension and tachycardia. EKG shows sinus tach. Wells PE score indicates low probability for PE. Bolus of normal saline initiated.  11:45 PM patient stable. Patient handed off to Dr. Beather Arbour.    Duanne Guess, PA-C 12/20/15 [place], MD 12/21/15 [phone]

## 2015-12-20 NOTE — ED Notes (Signed)
Pt with increased coughing, pt states "i need a breathing treatment, i can't breathe." no wheezing noted. Order for duoneb received.

## 2015-12-21 LAB — TROPONIN I: Troponin I: <0.03 ng/mL (ref <0.031)

## 2015-12-21 MED ORDER — CLONIDINE HCL 0.1 MG PO TABS
0.1000 mg | ORAL_TABLET | Freq: Once | ORAL | Status: AC
  Administered 2015-12-21: 0.1 mg via ORAL

## 2015-12-21 MED ORDER — LORAZEPAM 1 MG PO TABS: 1.0000 mg | ORAL_TABLET | Freq: Three times a day (TID) | ORAL | Status: DC | PRN

## 2015-12-21 MED ORDER — OXYCODONE-ACETAMINOPHEN 5-325 MG PO TABS: 1.0000 | ORAL_TABLET | ORAL | Status: DC | PRN

## 2015-12-21 MED ORDER — LORAZEPAM 2 MG/ML IJ SOLN: 0.0000 mg | Freq: Four times a day (QID) | INTRAMUSCULAR | Status: DC

## 2015-12-21 MED ORDER — VITAMIN B-1 100 MG PO TABS: 100.0000 mg | ORAL_TABLET | Freq: Every day | ORAL | Status: DC

## 2015-12-21 MED ORDER — LORAZEPAM 2 MG/ML IJ SOLN
1.0000 mg | Freq: Once | INTRAMUSCULAR | Status: AC
  Administered 2015-12-21: 1 mg via INTRAVENOUS
  Filled 2015-12-21: qty 1

## 2015-12-21 MED ORDER — SODIUM CHLORIDE 0.9 % IV BOLUS (SEPSIS)
1000.0000 mL | Freq: Once | INTRAVENOUS | Status: AC
  Administered 2015-12-21: 1000 mL via INTRAVENOUS

## 2015-12-21 MED ORDER — CLONIDINE HCL 0.1 MG PO TABS
ORAL_TABLET | ORAL | Status: AC
  Filled 2015-12-21: qty 1

## 2015-12-21 NOTE — ED Provider Notes (Signed)
-----------------------------------------   12:27 AM on 12/21/2015 -----------------------------------------  Patient noted to be tremulous, tachycardic and hypertensive. Admits to heavy EtOH use. Denies active SI/HI/AH/VH. Will initiate second liter IV fluid bolus, IV Ativan and reassess.  ----------------------------------------- 3:20 AM on 12/21/2015 -----------------------------------------  Patient mildly tremulous. Additional ativan given.  ----------------------------------------- 5:43 AM on 12/21/2015 -----------------------------------------  Patient seen by TTS and given outpatient referrals for detox. Patient does not want inpatient detox as he cares for his wife who has multiple sclerosis. Evidently patient has not been taking his antihypertensives including his metoprolol. I have advised patient to resume his daily medications as prescribed by his doctor. Will write prescriptions for analgesia for foot pain as well as Ativan for withdrawal type symptoms. However, with a EtOH level of almost 300, I doubt patient is suffering from acute withdrawals. Overall, tachycardia and hypertension have improved. Strict return precautions given. Patient verbalizes understanding and agrees with plan of care.  Paulette Blanch, MD 12/21/15 [phone]

## 2015-12-21 NOTE — ED Notes (Signed)
Pt with less agitation noted, pt states headache improved. Will continue to monitor ciwa.

## 2015-12-21 NOTE — ED Notes (Signed)
Pt has no driver home after he stated he was dropped off and would find a ride home. Pt now states he drove himself to hospital. Pt advised will not be able to leave until 4 hour shot time up. Pt verbalizes understanding.

## 2015-12-21 NOTE — ED Notes (Signed)
Report to felicia, rn.  

## 2015-12-21 NOTE — [facility] Suicide Risk Assessment (Signed)
Per EDP Dr. Beather Arbour pt is not in need of TTS assessment. Pt denies SI, HI & psychosis. Pt expressed interest in outpatient SA services and was provided with resources and instructions on how to follow up with [facility]. Pt was provided with resources to RTSA as well. Pt verbalized intent to follow up with resources and stated that he does have reliable transportation to do so.

## 2015-12-21 NOTE — Discharge Instructions (Signed)
1. You may take pain medicine as needed (Percocet #15). 2. You may take Ativan as needed for shakiness. 3. Take your medicines daily as by your doctor. 4. Please call the number provided to you if you're interested in outpatient alcohol detox. 5. Return to the ER for worsening symptoms, persistent vomiting, difficulty breathing or other concerns.  Alcohol Intoxication Alcohol intoxication occurs when the amount of alcohol that a person has consumed impairs his or her ability to mentally and physically function. Alcohol directly impairs the normal chemical activity of the brain. Drinking large amounts of alcohol can lead to changes in mental function and behavior, and it can cause many physical effects that can be harmful.  Alcohol intoxication can range in severity from mild to very severe. Various factors can affect the level of intoxication that occurs, such as the person's age, gender, weight, frequency of alcohol consumption, and the presence of other medical conditions (such as diabetes, seizures, or heart conditions). Dangerous levels of alcohol intoxication may occur when people drink large amounts of alcohol in a short period (binge drinking). Alcohol can also be especially dangerous when combined with certain prescription medicines or "recreational" drugs. SIGNS AND SYMPTOMS Some common signs and symptoms of mild alcohol intoxication include:  Loss of coordination.  Changes in mood and behavior.  Impaired judgment.  Slurred speech. As alcohol intoxication progresses to more severe levels, other signs and symptoms will appear. These may include:  Vomiting.  Confusion and impaired memory.  Slowed breathing.  Seizures.  Loss of consciousness. DIAGNOSIS  Your health care provider will take a medical history and perform a physical exam. You will be asked about the amount and type of alcohol you have consumed. Blood tests will be done to measure the concentration of alcohol in your  blood. In many places, your blood alcohol level must be lower than 80 mg/dL (0.08%) to legally drive. However, many dangerous effects of alcohol can occur at much lower levels.  TREATMENT  People with alcohol intoxication often do not require treatment. Most of the effects of alcohol intoxication are temporary, and they go away as the alcohol naturally leaves the body. Your health care provider will monitor your condition until you are stable enough to go home. Fluids are sometimes given through an IV access tube to help prevent dehydration.  HOME CARE INSTRUCTIONS  Do not drive after drinking alcohol.  Stay hydrated. Drink enough water and fluids to keep your urine clear or pale yellow. Avoid caffeine.   Only take over-the-counter or prescription medicines as directed by your health care provider.  SEEK MEDICAL CARE IF:   You have persistent vomiting.   You do not feel better after a few days.  You have frequent alcohol intoxication. Your health care provider can help determine if you should see a substance use treatment counselor. SEEK IMMEDIATE MEDICAL CARE IF:   You become shaky or tremble when you try to stop drinking.   You shake uncontrollably (seizure).   You throw up (vomit) blood. This may be bright red or may look like black coffee grounds.   You have blood in your stool. This may be bright red or may appear as a black, tarry, bad smelling stool.   You become lightheaded or faint.  MAKE SURE YOU:   Understand these instructions.  Will watch your condition.  Will get help right away if you are not doing well or get worse.   This information is not intended to replace advice given to  you by your health care provider. Make sure you discuss any questions you have with your health care provider.   Document Released: 08/12/2005 Document Revised: 07/05/2013 Document Reviewed: 04/07/2013 Elsevier Interactive Patient Education 2016 [employer].  Hypertension Hypertension, commonly called high blood pressure, is when the force of blood pumping through your arteries is too strong. Your arteries are the blood vessels that carry blood from your heart throughout your body. A blood pressure reading consists of a higher number over a lower number, such as 110/72. The higher number (systolic) is the pressure inside your arteries when your heart pumps. The lower number (diastolic) is the pressure inside your arteries when your heart relaxes. Ideally you want your blood pressure below 120/80. Hypertension forces your heart to work harder to pump blood. Your arteries may become narrow or stiff. Having untreated or uncontrolled hypertension can cause heart attack, stroke, kidney disease, and other problems. RISK FACTORS Some risk factors for high blood pressure are controllable. Others are not.  Risk factors you cannot control include:   Race. You may be at higher risk if you are African American.  Age. Risk increases with age.  Gender. Men are at higher risk than women before age 31 years. After age 56, women are at higher risk than men. Risk factors you can control include:  Not getting enough exercise or physical activity.  Being overweight.  Getting too much fat, sugar, calories, or salt in your diet.  Drinking too much alcohol. SIGNS AND SYMPTOMS Hypertension does not usually cause signs or symptoms. Extremely high blood pressure (hypertensive crisis) may cause headache, anxiety, shortness of breath, and nosebleed. DIAGNOSIS To check if you have hypertension, your health care provider will measure your blood pressure while you are seated, with your arm held at the level of your heart. It should be measured at least twice using the same arm. Certain conditions can cause a difference in blood pressure between your right and left arms. A blood pressure reading that is higher than normal on one occasion does not mean that you need  treatment. If it is not clear whether you have high blood pressure, you may be asked to return on a different day to have your blood pressure checked again. Or, you may be asked to monitor your blood pressure at home for 1 or more weeks. TREATMENT Treating high blood pressure includes making lifestyle changes and possibly taking medicine. Living a healthy lifestyle can help lower high blood pressure. You may need to change some of your habits. Lifestyle changes may include:  Following the DASH diet. This diet is high in fruits, vegetables, and whole grains. It is low in salt, red meat, and added sugars.  Keep your sodium intake below 2,300 mg per day.  Getting at least 30-45 minutes of aerobic exercise at least 4 times per week.  Losing weight if necessary.  Not smoking.  Limiting alcoholic beverages.  Learning ways to reduce stress. Your health care provider may prescribe medicine if lifestyle changes are not enough to get your blood pressure under control, and if one of the following is true:  You are 2-10 years of age and your systolic blood pressure is above 140.  You are 28 years of age or older, and your systolic blood pressure is above 150.  Your diastolic blood pressure is above 90.  You have diabetes, and your systolic blood pressure is over [id number] or your diastolic blood pressure is over 90.  You have kidney disease  and your blood pressure is above 140/90.  You have heart disease and your blood pressure is above 140/90. Your personal target blood pressure may vary depending on your medical conditions, your age, and other factors. HOME CARE INSTRUCTIONS  Have your blood pressure rechecked as directed by your health care provider.   Take medicines only as directed by your health care provider. Follow the directions carefully. Blood pressure medicines must be taken as prescribed. The medicine does not work as well when you skip doses. Skipping doses also puts you at risk for  problems.  Do not smoke.   Monitor your blood pressure at home as directed by your health care provider. SEEK MEDICAL CARE IF:   You think you are having a reaction to medicines taken.  You have recurrent headaches or feel dizzy.  You have swelling in your ankles.  You have trouble with your vision. SEEK IMMEDIATE MEDICAL CARE IF:  You develop a severe headache or confusion.  You have unusual weakness, numbness, or feel faint.  You have severe chest or abdominal pain.  You vomit repeatedly.  You have trouble breathing. MAKE SURE YOU:   Understand these instructions.  Will watch your condition.  Will get help right away if you are not doing well or get worse.   This information is not intended to replace advice given to you by your health care provider. Make sure you discuss any questions you have with your health care provider.   Document Released: 11/02/2005 Document Revised: 03/19/2015 Document Reviewed: 08/25/2013 Elsevier Interactive Patient Education 2016 Elsevier Inc.  Ankle Pain Ankle pain is a common symptom. The bones, cartilage, tendons, and muscles of the ankle joint perform a lot of work each day. The ankle joint holds your body weight and allows you to move around. Ankle pain can occur on either side or back of 1 or both ankles. Ankle pain may be sharp and burning or dull and aching. There may be tenderness, stiffness, redness, or warmth around the ankle. The pain occurs more often when a person walks or puts pressure on the ankle. CAUSES  There are many reasons ankle pain can develop. It is important to work with your caregiver to identify the cause since many conditions can impact the bones, cartilage, muscles, and tendons. Causes for ankle pain include:  Injury, including a break (fracture), sprain, or strain often due to a fall, sports, or a high-impact activity.  Swelling (inflammation) of a tendon (tendonitis).  Achilles tendon rupture.  Ankle  instability after repeated sprains and strains.  Poor foot alignment.  Pressure on a nerve (tarsal tunnel syndrome).  Arthritis in the ankle or the lining of the ankle.  Crystal formation in the ankle (gout or pseudogout). DIAGNOSIS  A diagnosis is based on your medical history, your symptoms, results of your physical exam, and results of diagnostic tests. Diagnostic tests may include X-ray exams or a computerized magnetic scan (magnetic resonance imaging, MRI). TREATMENT  Treatment will depend on the cause of your ankle pain and may include:  Keeping pressure off the ankle and limiting activities.  Using crutches or other walking support (a cane or brace).  Using rest, ice, compression, and elevation.  Participating in physical therapy or home exercises.  Wearing shoe inserts or special shoes.  Losing weight.  Taking medications to reduce pain or swelling or receiving an injection.  Undergoing surgery. HOME CARE INSTRUCTIONS   Only take over-the-counter or prescription medicines for pain, discomfort, or fever as directed by  your caregiver.  Put ice on the injured area.  Put ice in a plastic bag.  Place a towel between your skin and the bag.  Leave the ice on for 15-20 minutes at a time, 03-04 times a day.  Keep your leg raised (elevated) when possible to lessen swelling.  Avoid activities that cause ankle pain.  Follow specific exercises as directed by your caregiver.  Record how often you have ankle pain, the location of the pain, and what it feels like. This information may be helpful to you and your caregiver.  Ask your caregiver about returning to work or sports and whether you should drive.  Follow up with your caregiver for further examination, therapy, or testing as directed. SEEK MEDICAL CARE IF:   Pain or swelling continues or worsens beyond 1 week.  You have an oral temperature above 102 F (38.9 C).  You are feeling unwell or have chills.  You  are having an increasingly difficult time with walking.  You have loss of sensation or other new symptoms.  You have questions or concerns. MAKE SURE YOU:   Understand these instructions.  Will watch your condition.  Will get help right away if you are not doing well or get worse.   This information is not intended to replace advice given to you by your health care provider. Make sure you discuss any questions you have with your health care provider.   Document Released: 04/22/2010 Document Revised: 01/25/2012 Document Reviewed: 06/04/2015 Elsevier Interactive Patient Education [employer].

## 2015-12-21 NOTE — ED Notes (Addendum)
Dr. Beather Arbour notified of ciwa, order for additional 1mg  ativan iv received. Pt states drinks 1-2 pints etoh daily. Pt denies seizure from etoh withdrawal in past. Will continue to monitor.

## 2015-12-21 NOTE — ED Notes (Signed)
Report from kendall, rn.  

## 2015-12-21 NOTE — ED Notes (Signed)
Dr. Beather Arbour in to assess.

## 2015-12-21 NOTE — ED Notes (Signed)
Dr. Beather Arbour notified of pt's [place].

## 2015-12-21 NOTE — ED Notes (Signed)
Pt sipping on water 

## 2015-12-22 IMAGING — CT CT CERVICAL SPINE W/O CM
4 series · 17 of 33 positions shown, 20 images · non-contrast
Comparison: 06/26/2014

CLINICAL DATA: Slipped on wet stairs, falling forward and hitting
the front of his face on a step

EXAM:
CT CERVICAL SPINE WITHOUT CONTRAST
TECHNIQUE: Multidetector CT imaging of the cervical spine was performed without
intravenous contrast. Multiplanar CT image reconstructions were also
generated.

[Series 3: c spine soft · axial · 0.30mm/px · z∈[-213,-123]mm · 4 of 90 slices shown]
[im 15/90  soft-tissue]
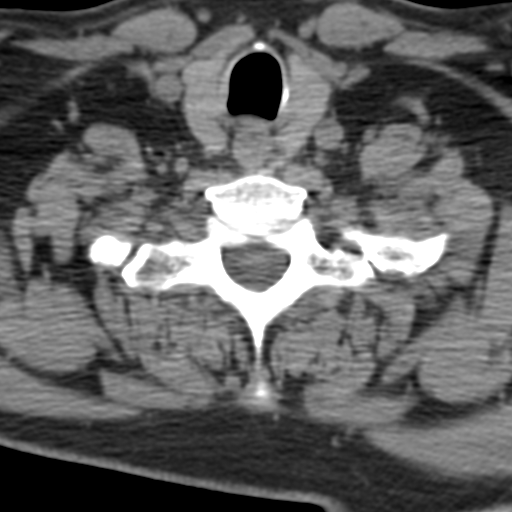
[im 30/90  soft-tissue]
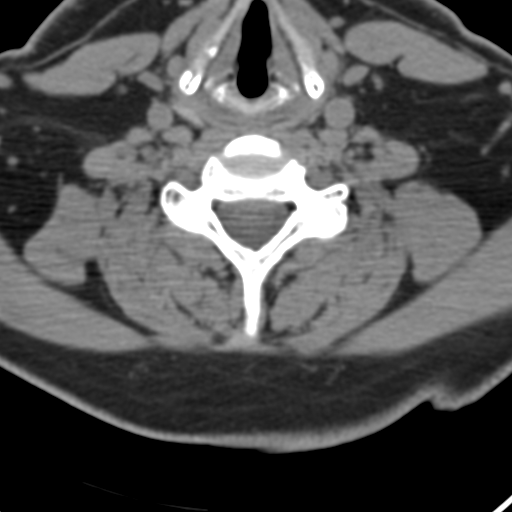
[im 45/90  soft-tissue]
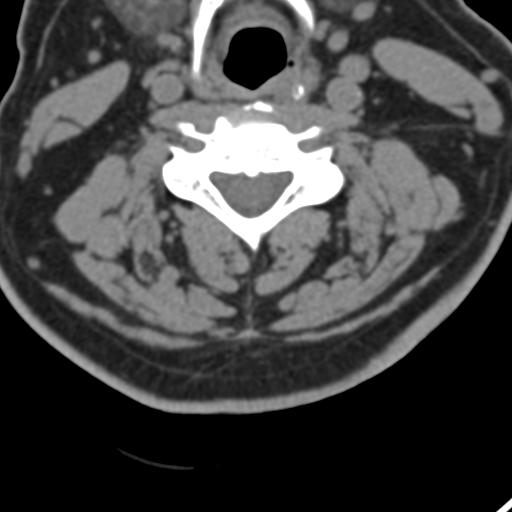
[im 60/90  soft-tissue]
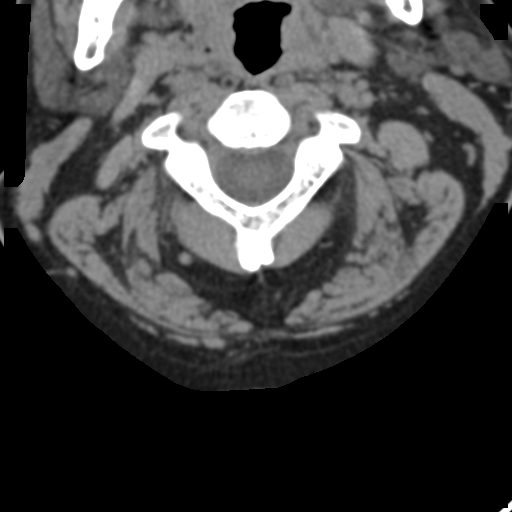

[Series 4: sag bone · sagittal · 0.37mm/px · 5 of 45 slices shown, 6 images]
[im 15/45  bone]
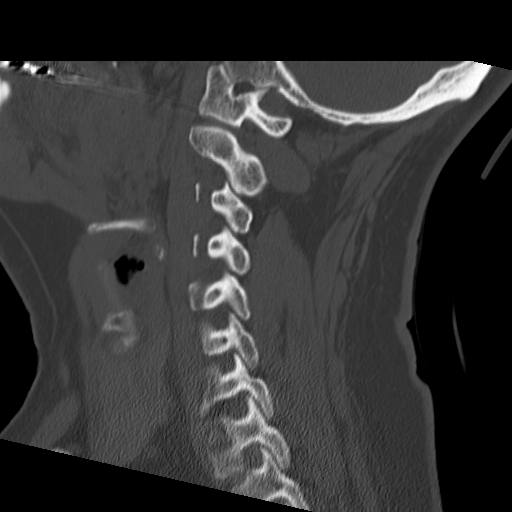
[im 19/45  bone]
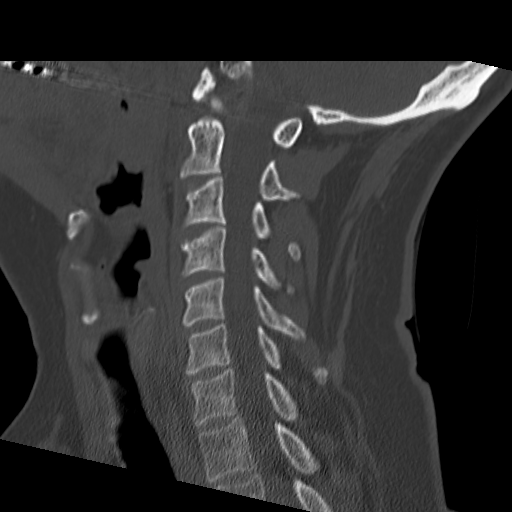
[im 23/45  soft-tissue]
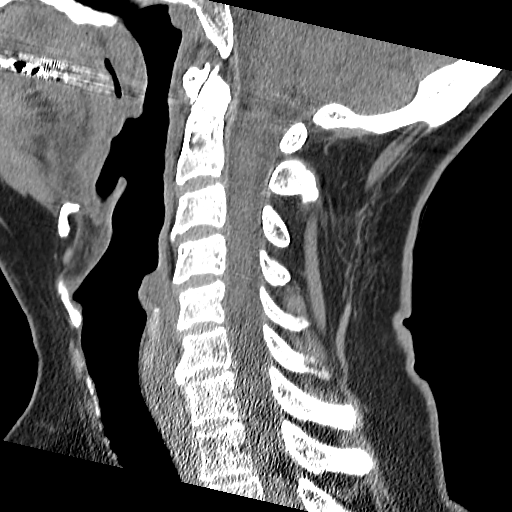
[im 23/45  bone]
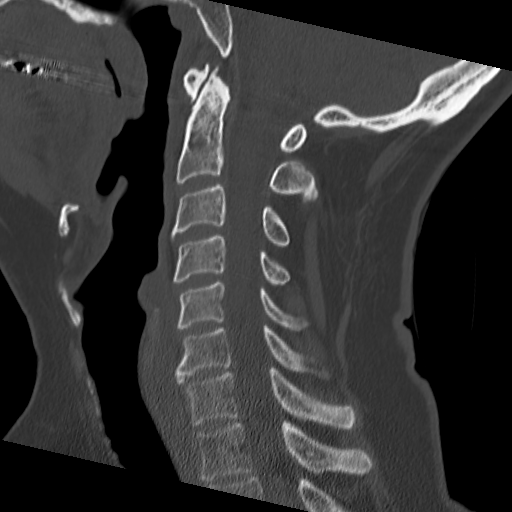
[im 26/45  bone]
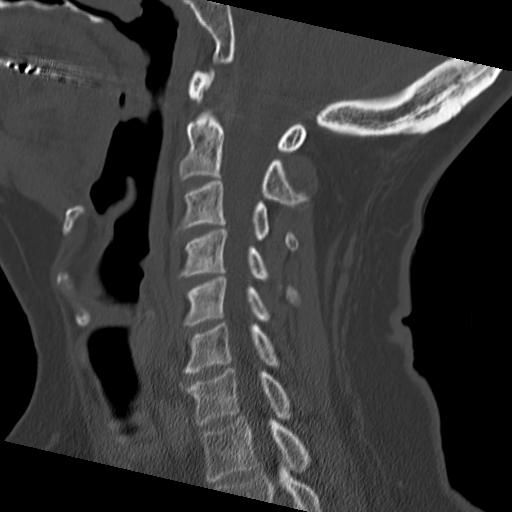
[im 30/45  bone]
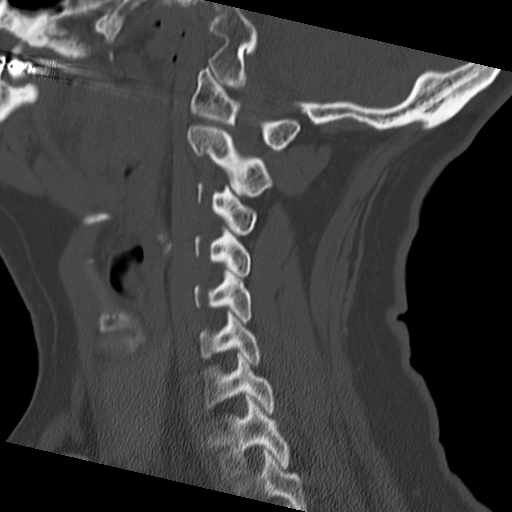

[Series 5: cor bone · coronal · 0.37mm/px · 3 of 42 slices shown]
[im 9/42  bone]
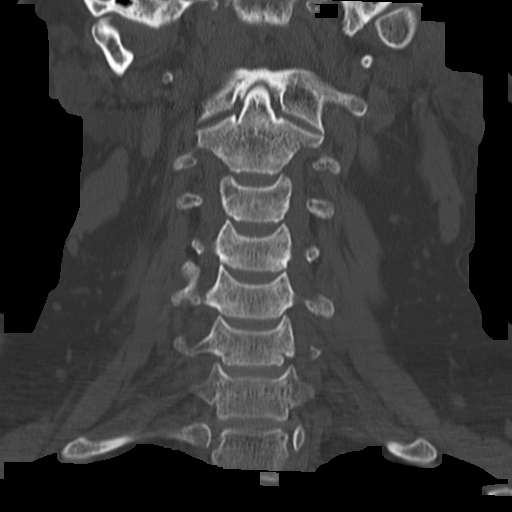
[im 17/42  bone]
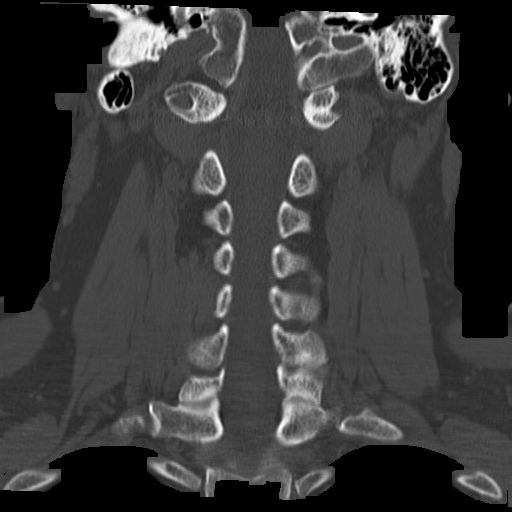
[im 25/42  bone]
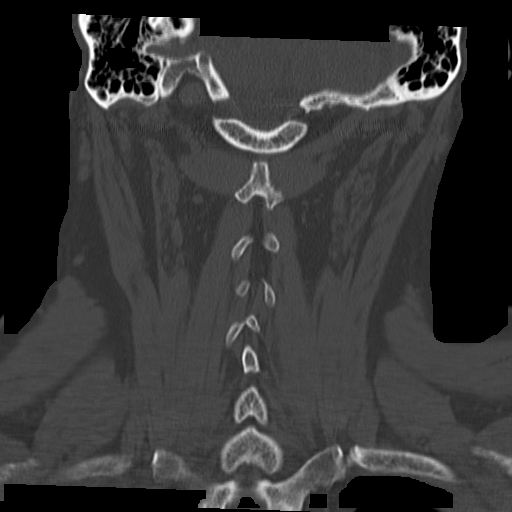

[Series 6: orthogonal axials · axial · 0.29mm/px · z∈[-229,-121]mm · 5 of 86 slices shown, 7 images]
[im 15/86  soft-tissue]
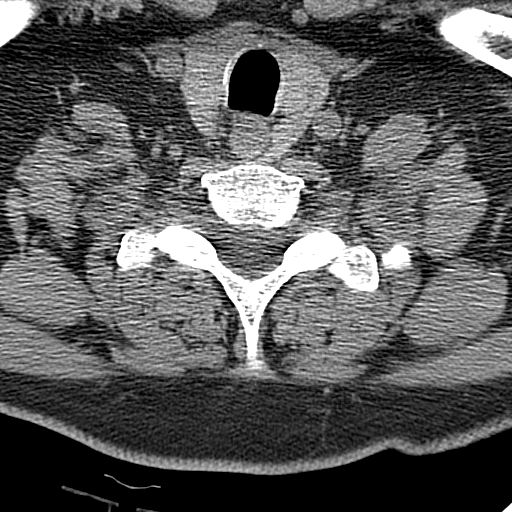
[im 15/86  bone]
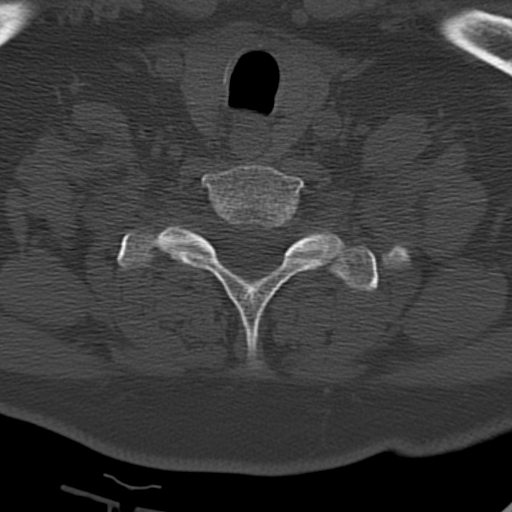
[im 29/86  bone]
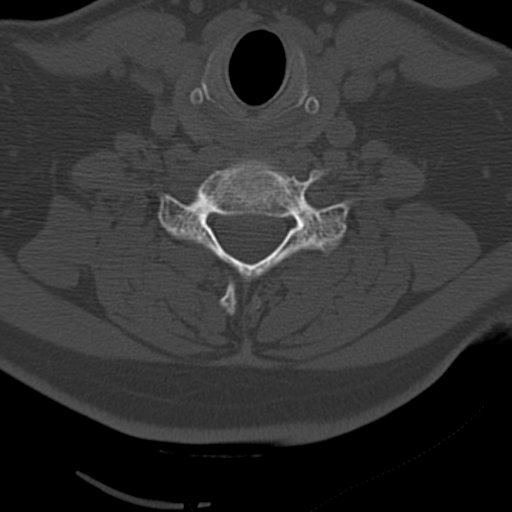
[im 43/86  bone]
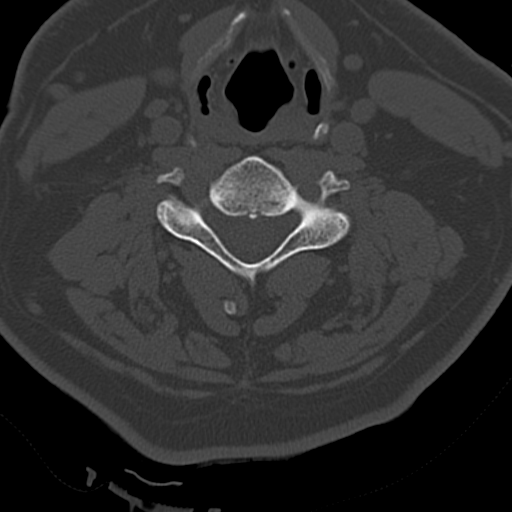
[im 57/86  bone]
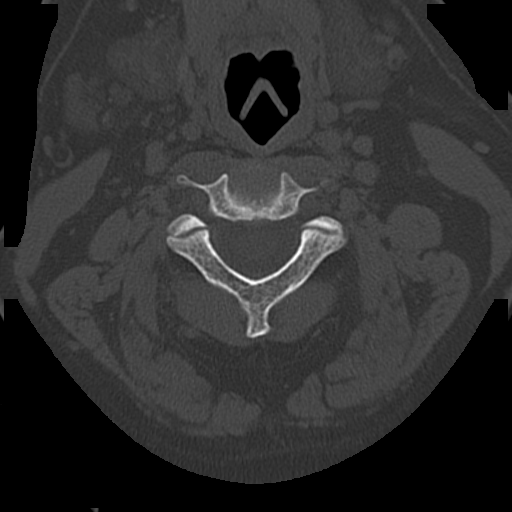
[im 71/86  soft-tissue]
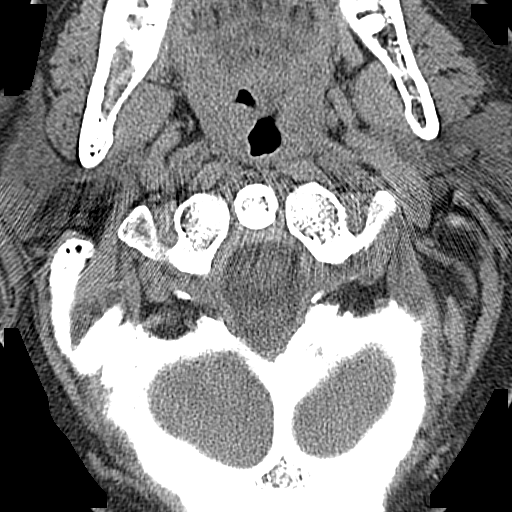
[im 71/86  bone]
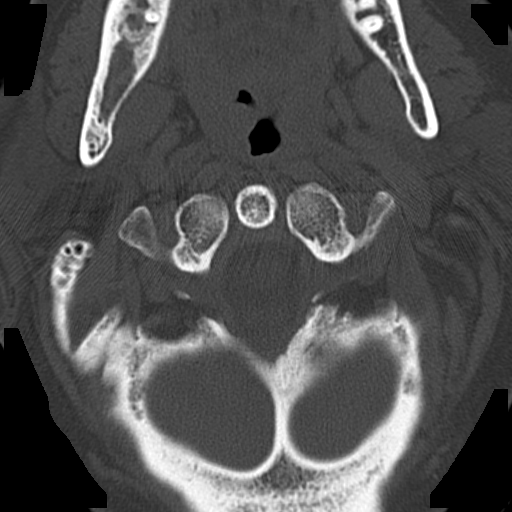

[17 of 33 positions shown; findings below may reference images not displayed]

FINDINGS: The vertebral column, pedicles and facet articulations are intact.
There is no evidence of acute fracture. No acute soft tissue
abnormalities are evident.

There are mild degenerative disc changes at C6-7
IMPRESSION: Negative for acute cervical spine fracture. Mild cervical
degenerative disc changes, greatest at C6-7.

## 2015-12-24 IMAGING — CR DG LUMBAR SPINE 2-3V
1 series · 2 of 2 positions shown · non-contrast
Comparison: 12/26/2014

CLINICAL DATA: Low back pain, fall on steps

EXAM:
LUMBAR SPINE - 2-3 VIEW

[Series 1: t lumbar spine ap · 0.14mm/px · 2 of 2 slices shown]
[im 1/2]
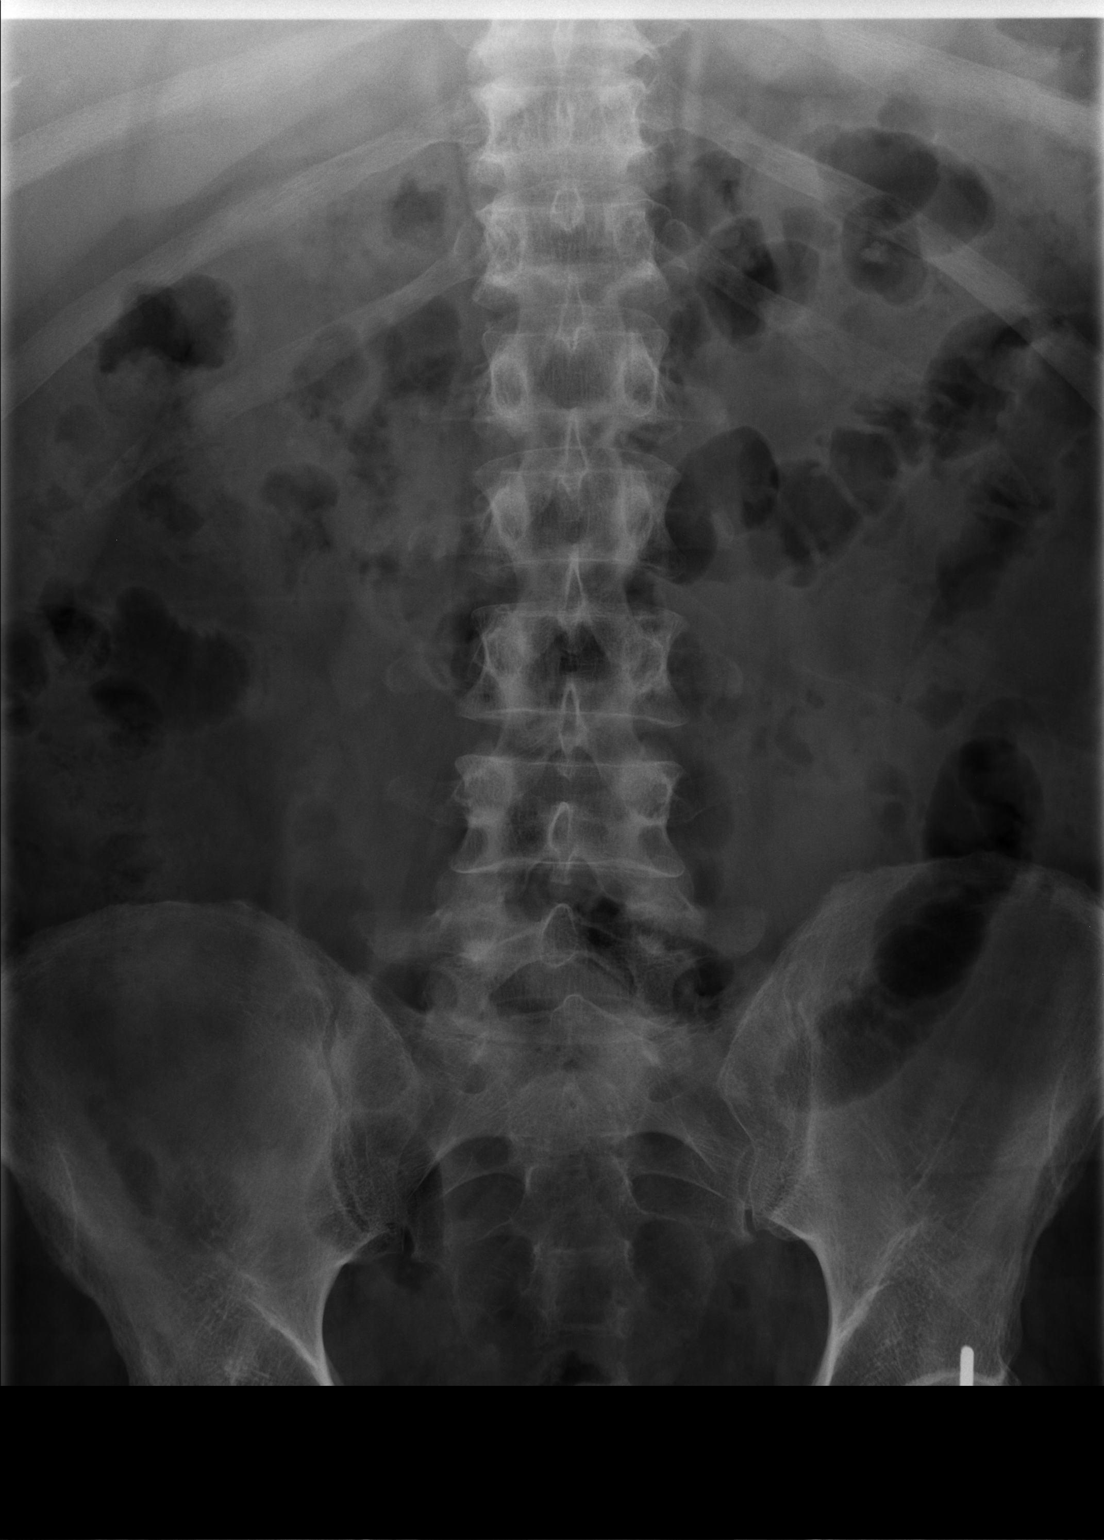
[im 2/2]
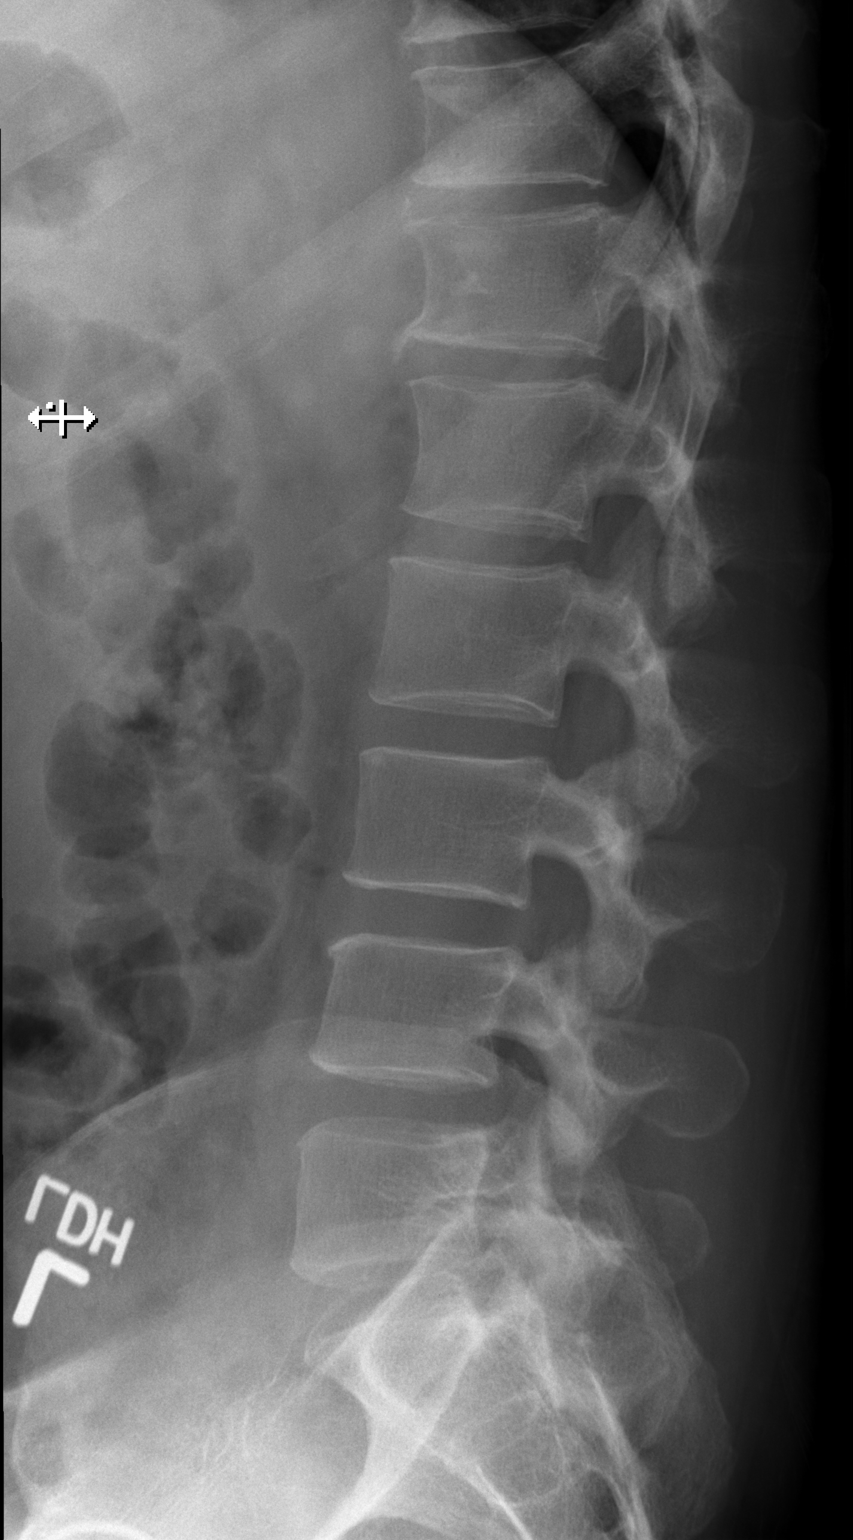

[2 of 2 positions shown; findings below may reference images not displayed]

FINDINGS: There is no evidence of lumbar spine fracture. Alignment is normal.
Intervertebral disc spaces are maintained. Lower thoracic spine
spurring noted. This is stable.
IMPRESSION: Negative.

## 2015-12-24 IMAGING — CT CT ABD-PELV W/O CM
3 of 4 series · 8 of 46 positions shown, 15 images · non-contrast
Comparison: 03/30/2014

CLINICAL DATA: Right flank pain.

EXAM:
CT ABDOMEN AND PELVIS WITHOUT CONTRAST
TECHNIQUE: Multidetector CT imaging of the abdomen and pelvis was performed
following the standard protocol without IV contrast.

[Series 4: lung · axial · 0.78mm/px · z∈[+44,+104]mm · 4 of 22 slices shown, 9 images]
[im 5/22  soft-tissue]
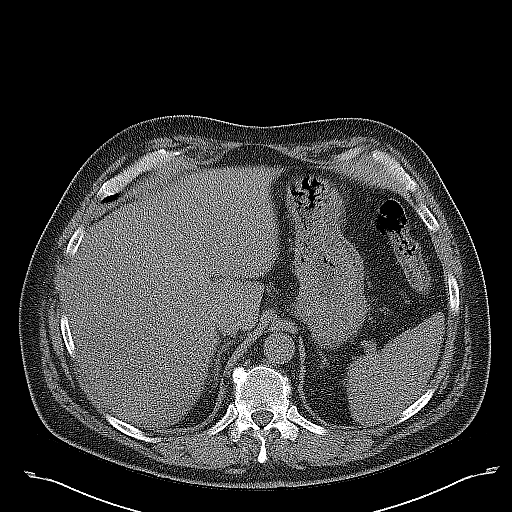
[im 5/22  lung]
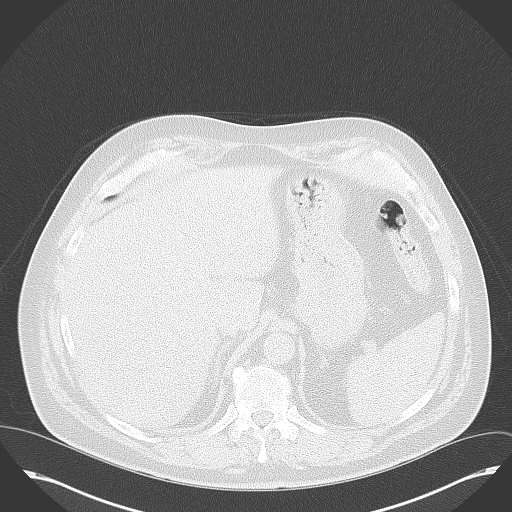
[im 5/22  bone]
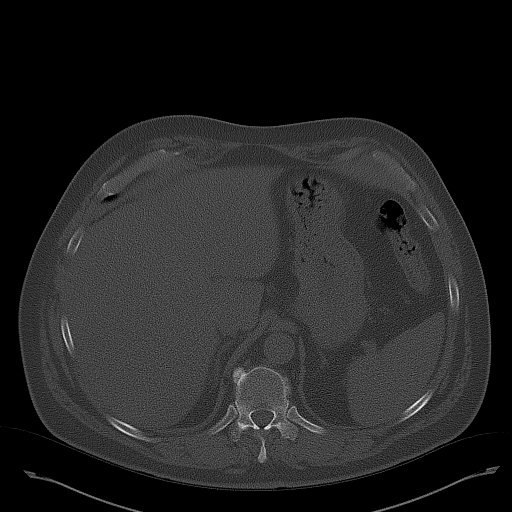
[im 9/22  soft-tissue]
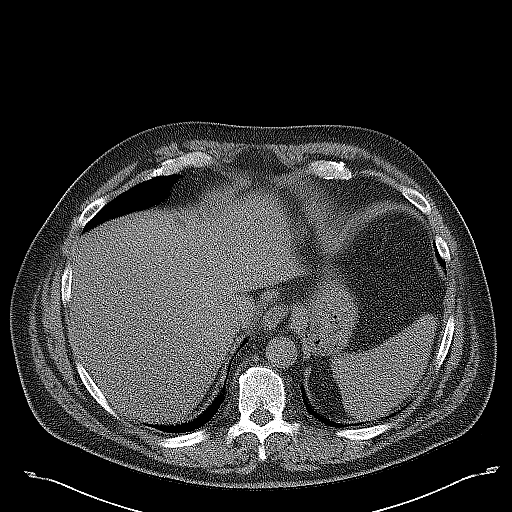
[im 9/22  lung]
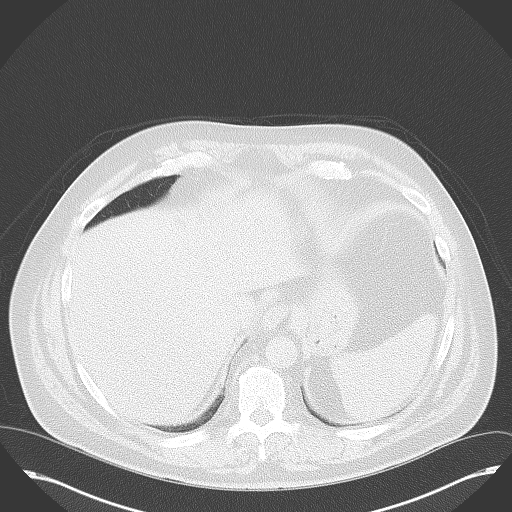
[im 13/22  soft-tissue]
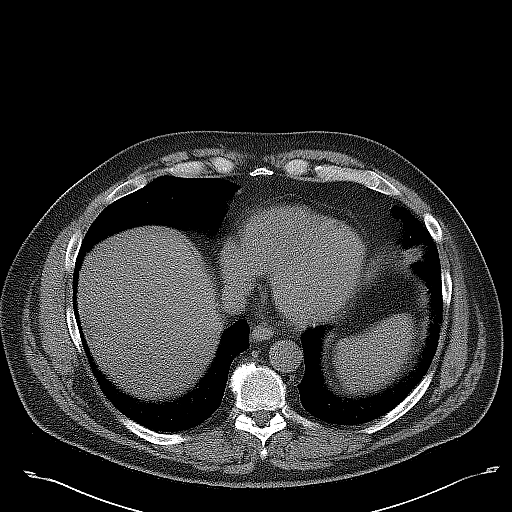
[im 13/22  lung]
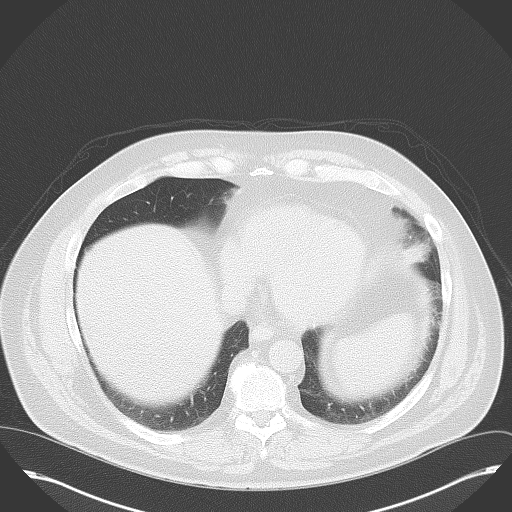
[im 17/22  soft-tissue]
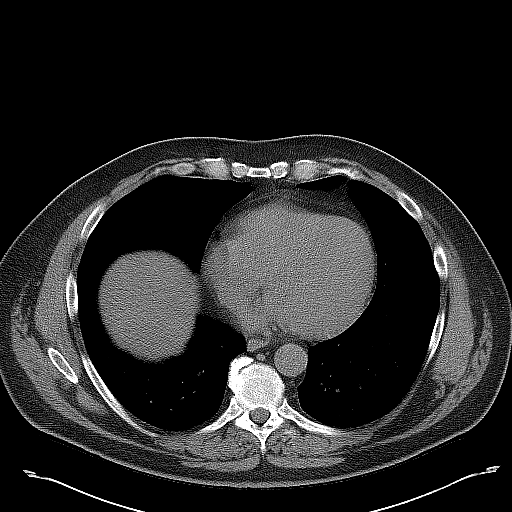
[im 17/22  lung]
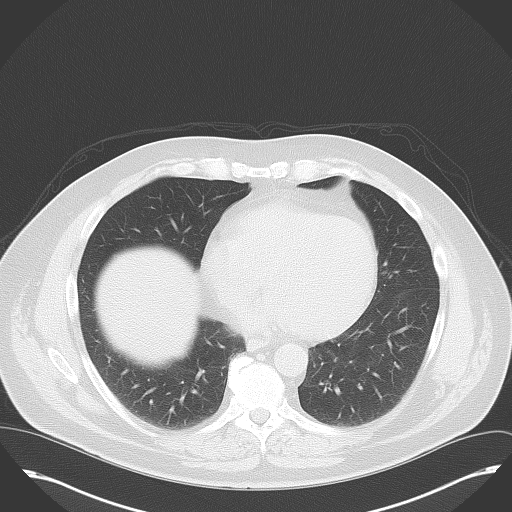

[Series 5: coronal · coronal · 0.83mm/px · 3 of 178 slices shown, 4 images]
[im 60/178  soft-tissue]
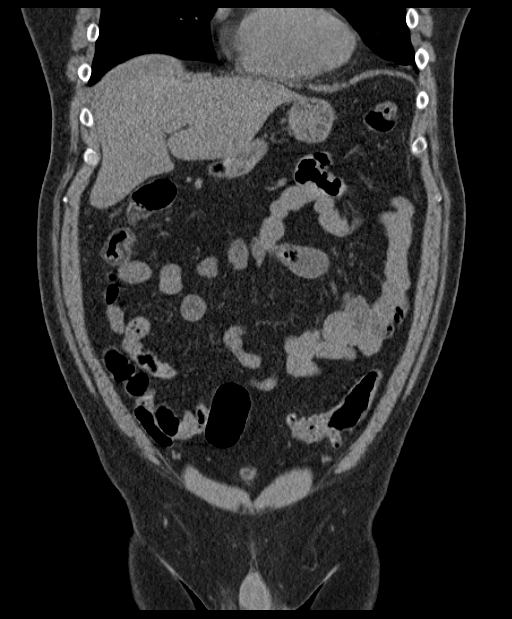
[im 79/178  soft-tissue]
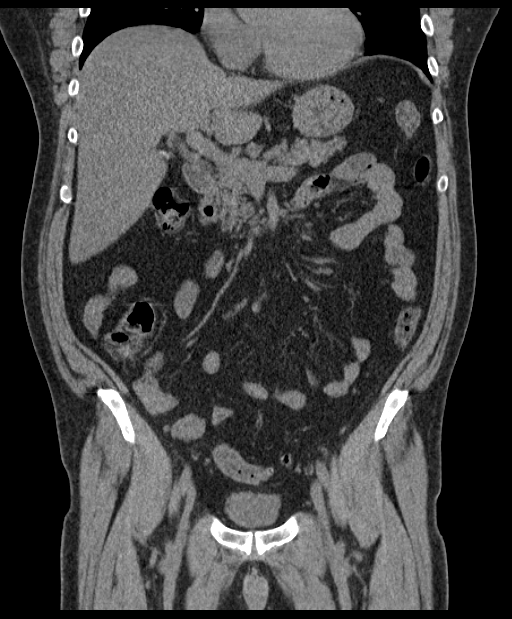
[im 79/178  bone]
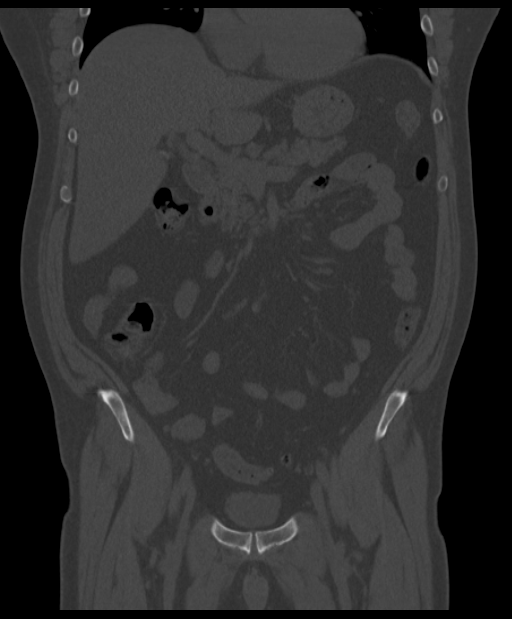
[im 99/178  soft-tissue]
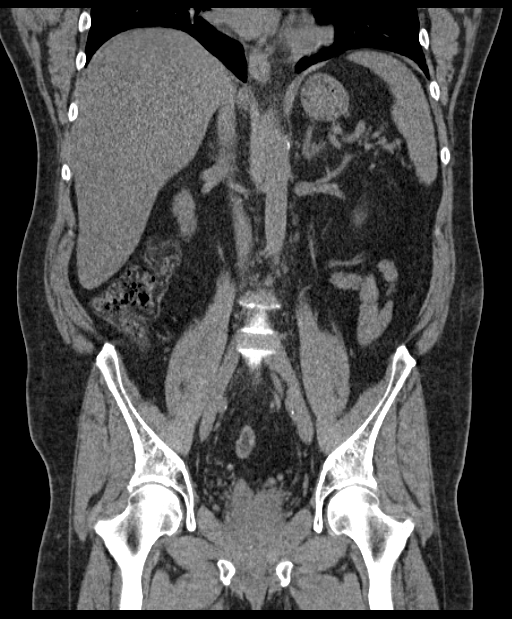

[Series 6: sagittal · sagittal · 0.74mm/px · 1 of 198 slices shown, 2 images]
[im 66/198  soft-tissue]
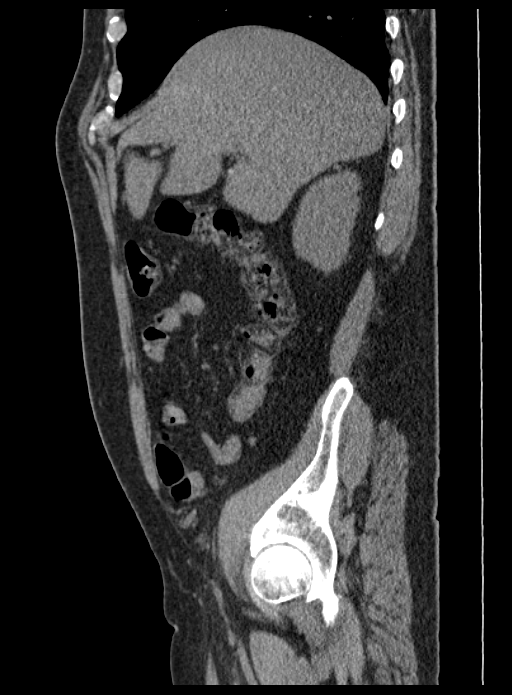
[im 66/198  bone]
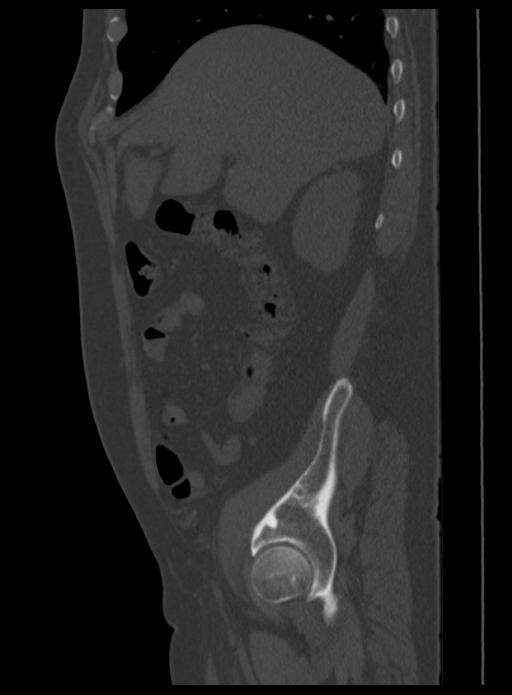

[8 of 46 positions shown; findings below may reference images not displayed]

FINDINGS: There is bilateral nephrolithiasis. On the right the largest
calculus is in the lower pole, 3 x 5 mm. On the left the largest
calculus is in the upper pole, 5 x 9 mm. There is no hydronephrosis.
There is no ureteral dilatation. There is no ureteral calculus.
There are no findings to suggest recent passage of a stone.

The appendix is normal. Remainder of the bowel is remarkable only
for moderate uncomplicated colonic diverticulosis.

There are normal unenhanced appearances of the liver, bile ducts,
pancreas, spleen and adrenals. There is prior cholecystectomy.

The abdominal aorta is normal in caliber with minimal
atherosclerotic calcification.

No acute inflammatory changes are evident in the abdomen or pelvis.
There is no adenopathy. There is no ascites.

There is no significant abnormality in the lower chest.

There is no significant musculoskeletal abnormality. Incidental note
is made of a small fat containing umbilical hernia
IMPRESSION: 1. Bilateral nephrolithiasis. No hydronephrosis. No ureteral
calculi.
2. No acute findings are evident in the abdomen or pelvis.
3. Diverticulosis.
4. Small fat containing umbilical hernia

## 2015-12-26 ENCOUNTER — Emergency Department
Admission: EM | Admit: 2015-12-26 | Discharge: 2015-12-26 | Discharge status: Against Medical Advice | Payer: Self-pay | Attending: Emergency Medicine | Admitting: Emergency Medicine

## 2015-12-26 ENCOUNTER — Ambulatory Visit: Payer: Self-pay

## 2015-12-26 ENCOUNTER — Encounter: Payer: Self-pay | Admitting: *Deleted

## 2015-12-26 DIAGNOSIS — F10920 Alcohol use, unspecified with intoxication, uncomplicated: Secondary | ICD-10-CM

## 2015-12-26 DIAGNOSIS — Z87891 Personal history of nicotine dependence: Secondary | ICD-10-CM | POA: Insufficient documentation

## 2015-12-26 DIAGNOSIS — I10 Essential (primary) hypertension: Secondary | ICD-10-CM | POA: Insufficient documentation

## 2015-12-26 DIAGNOSIS — F10120 Alcohol abuse with intoxication, uncomplicated: Secondary | ICD-10-CM | POA: Insufficient documentation

## 2015-12-26 LAB — COMPREHENSIVE METABOLIC PANEL
ALT: 29 U/L (ref 17–63)
AST: 26 U/L (ref 15–41)
Albumin: 4 g/dL (ref 3.5–5.0)
Alkaline Phosphatase: 67 U/L (ref 38–126)
Anion gap: 11 (ref 5–15)
BUN: 10 mg/dL (ref 6–20)
CO2: 26 mmol/L (ref 22–32)
Calcium: 9 mg/dL (ref 8.9–10.3)
Chloride: 105 mmol/L (ref 101–111)
Creatinine, Ser: 0.91 mg/dL (ref 0.61–1.24)
GFR calc Af Amer: >60 mL/min (ref >60)
GFR calc non Af Amer: >60 mL/min (ref >60)
Glucose, Bld: 118 mg/dL — ABNORMAL HIGH (ref 65–99)
Potassium: 3.2 mmol/L — ABNORMAL LOW (ref 3.5–5.1)
Sodium: 142 mmol/L (ref 135–145)
Total Bilirubin: 1.3 mg/dL — ABNORMAL HIGH (ref 0.3–1.2)
Total Protein: 6.8 g/dL (ref 6.5–8.1)

## 2015-12-26 LAB — CBC WITH DIFFERENTIAL/PLATELET
Basophils Absolute: 0 10*3/uL (ref 0–0.1)
Basophils Relative: 1 %
Eosinophils Absolute: 0 10*3/uL (ref 0–0.7)
Eosinophils Relative: 1 %
HCT: 45.3 % (ref 40.0–52.0)
Hemoglobin: 15 g/dL (ref 13.0–18.0)
Lymphocytes Relative: 28 %
Lymphs Abs: 1.6 10*3/uL (ref 1.0–3.6)
MCH: 31.1 pg (ref 26.0–34.0)
MCHC: 33.1 g/dL (ref 32.0–36.0)
MCV: 93.9 fL (ref 80.0–100.0)
Monocytes Absolute: 0.6 10*3/uL (ref 0.2–1.0)
Monocytes Relative: 10 %
Neutro Abs: 3.5 10*3/uL (ref 1.4–6.5)
Neutrophils Relative %: 60 %
Platelets: 160 10*3/uL (ref 150–440)
RBC: 4.82 MIL/uL (ref 4.40–5.90)
RDW: 14 % (ref 11.5–14.5)
WBC: 5.7 10*3/uL (ref 3.8–10.6)

## 2015-12-26 LAB — TROPONIN I: Troponin I: <0.03 ng/mL (ref <0.031)

## 2015-12-26 LAB — ETHANOL: Alcohol, Ethyl (B): 210 mg/dL — ABNORMAL HIGH (ref <5)

## 2015-12-26 MED ORDER — LORAZEPAM 2 MG/ML IJ SOLN: 0.0000 mg | Freq: Four times a day (QID) | INTRAMUSCULAR | Status: DC

## 2015-12-26 MED ORDER — LORAZEPAM 2 MG/ML IJ SOLN
0.0000 mg | Freq: Two times a day (BID) | INTRAMUSCULAR | Status: DC
Start: 2015-12-26 — End: 2015-12-27

## 2015-12-26 MED ORDER — VITAMIN B-1 100 MG PO TABS
100.0000 mg | ORAL_TABLET | Freq: Every day | ORAL | Status: DC
  Administered 2015-12-26: 100 mg via ORAL
  Filled 2015-12-26 (×2): qty 1

## 2015-12-26 MED ORDER — DIAZEPAM 5 MG PO TABS
10.0000 mg | ORAL_TABLET | Freq: Once | ORAL | Status: AC
  Administered 2015-12-26: 10 mg via ORAL
  Filled 2015-12-26: qty 2

## 2015-12-26 MED ORDER — LORAZEPAM 1 MG PO TABS: 0.0000 mg | ORAL_TABLET | Freq: Four times a day (QID) | ORAL | Status: DC

## 2015-12-26 MED ORDER — LORAZEPAM 1 MG PO TABS: 0.0000 mg | ORAL_TABLET | Freq: Two times a day (BID) | ORAL | Status: DC

## 2015-12-26 MED ORDER — THIAMINE HCL 100 MG/ML IJ SOLN
100.0000 mg | Freq: Every day | INTRAMUSCULAR | Status: DC
  Filled 2015-12-26: qty 1

## 2015-12-26 NOTE — ED Notes (Addendum)
Pt arrives with complaints of "not feeling well", pt appears flat in affect, states he was given some ativan last time he was here, denies any pain at this time but just states anxiety, denies any ETOH today, states his wife is mentally ill and he wants help to stop drinking, states he drinks more then he eats, pt drinks vodka

## 2015-12-26 NOTE — ED Provider Notes (Addendum)
[facility] Emergency Department Provider Note     Time seen: ----------------------------------------- 5:14 PM on 12/26/2015 -----------------------------------------  L5 caveat: Review of systems and history is limited by confusion  I have reviewed the triage vital signs and the nursing notes.   HISTORY  Chief Complaint Anxiety and Alcohol Problem    HPI Miguel [name] is a 55 y.o. male who presents ER stating he is not feeling well, last drank alcohol yesterday. 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 Patient initially has requested medication to ease his withdrawal symptoms. Currently he is confused.   Past Medical History  Diagnosis Date  . Hypertension   . Gout   . Renal colic   . Asthma     There are no active problems to display for this patient.   Past Surgical History  Procedure Laterality Date  . Cholecystectomy      Allergies Review of patient's allergies indicates no known allergies.  Social History Social History  Substance Use Topics  . Smoking status: Former Research scientist (life sciences)  . Smokeless tobacco: None  . Alcohol Use: No     Comment: occasionally    Review of Systems Unknown  ____________________________________________   PHYSICAL EXAM:  VITAL SIGNS: ED Triage Vitals  Enc Vitals Group     BP 12/26/15 1540 149/118 mmHg     Pulse Rate 12/26/15 1540 84     Resp 12/26/15 1540 18     Temp 12/26/15 1540 97.6 F (36.4 C)     Temp Source 12/26/15 1540 Oral     SpO2 12/26/15 1540 100 %     Weight 12/26/15 1540 200 lb (90.719 kg)     Height 12/26/15 1540 5\' 8"  (1.727 m)     Head Cir --      Peak Flow --      Pain Score 12/26/15 1541 0     Pain Loc --      Pain Edu? --      Excl. in [place]? --     Constitutional: Alert but disoriented, no acute distress Eyes: Conjunctivae are normal. PERRL. Normal extraocular movements. ENT   Head: Normocephalic and atraumatic.   Nose: No congestion/rhinnorhea.    Mouth/Throat: Mucous membranes are moist.   Neck: No stridor. Cardiovascular: Normal rate, regular rhythm. Normal and symmetric distal pulses are present in all extremities. No murmurs, rubs, or gallops. Respiratory: Normal respiratory effort without tachypnea nor retractions. Breath sounds are clear and equal bilaterally. No wheezes/rales/rhonchi. Gastrointestinal: Soft and nontender. No distention. No abdominal bruits.  Musculoskeletal: Nontender with normal range of motion in all extremities. Neurologic:  Patient is slurring his words. No gross focal neurologic deficits are appreciated. \ Skin:  Skin is warm, dry and intact. Erythematous skin on the face and neck ____________________________________________  ED COURSE:  Pertinent labs & imaging results that were available during my care of the patient were reviewed by me and considered in my medical decision making (see chart for details). Patient is well-known to the ER, likely suffering from alcohol withdrawal. I will place on CIWA protocol ____________________________________________    LABS (pertinent positives/negatives)  Labs Reviewed  COMPREHENSIVE METABOLIC PANEL - Abnormal; Notable for the following:    Potassium 3.2 (*)    Glucose, Bld 118 (*)    Total Bilirubin 1.3 (*)    All other components within normal limits  ETHANOL - Abnormal; Notable for the following:    Alcohol, Ethyl (B) 210 (*)    All other components within normal limits  CBC WITH DIFFERENTIAL/PLATELET  TROPONIN I  URINE DRUG SCREEN, QUALITATIVE ([facility] ONLY)  ____________________________________________  FINAL ASSESSMENT AND PLAN  Acute alcohol intoxication  Plan: Patient with labs as dictated above. Patient is in no acute distress but is not capable safely driving at this point. He will need to sober up before this can be achieved.  Earleen Newport, MD   Earleen Newport, MD 12/26/15 [place], MD 12/26/15 Lurline Hare

## 2015-12-26 NOTE — ED Notes (Signed)
Pt walking around room after having taken IV out by self. Pts blood is all over the floor and pt is bleeding from IV site. When asked why pt took IV out pt reports "I do not have an IV" when told he did at one time have an IV pt reports "I never had an IV." Pt continues to state, " The blood is coming from the green bag" and "I am confused." Bleeding from IV site is controlled at this time and catheter is confirmed to be intact. Pt is sitting at edge of bed at this time. No acute distress noted.

## 2015-12-26 NOTE — ED Notes (Signed)
This RN went to see patient, patient gone at this time.  Unable to get AMA signed, pt left without saying anything to this RN.

## 2015-12-26 NOTE — ED Notes (Signed)
Pt sent over here from [place].  Pt is slurring words and mumbling over words.  Unable to enunciate at this time.

## 2015-12-30 ENCOUNTER — Encounter: Payer: Self-pay | Admitting: Pharmacist

## 2015-12-31 ENCOUNTER — Encounter: Payer: Self-pay | Admitting: Emergency Medicine

## 2015-12-31 ENCOUNTER — Emergency Department
Admission: EM | Admit: 2015-12-31 | Discharge: 2015-12-31 | Disposition: A | Discharge status: Home / Self Care | Payer: Self-pay | Attending: Emergency Medicine | Admitting: Emergency Medicine

## 2015-12-31 DIAGNOSIS — S025XXA Fracture of tooth (traumatic), initial encounter for closed fracture: Secondary | ICD-10-CM | POA: Insufficient documentation

## 2015-12-31 DIAGNOSIS — X58XXXA Exposure to other specified factors, initial encounter: Secondary | ICD-10-CM | POA: Insufficient documentation

## 2015-12-31 DIAGNOSIS — Y998 Other external cause status: Secondary | ICD-10-CM | POA: Insufficient documentation

## 2015-12-31 DIAGNOSIS — K0889 Other specified disorders of teeth and supporting structures: Secondary | ICD-10-CM

## 2015-12-31 DIAGNOSIS — Y9289 Other specified places as the place of occurrence of the external cause: Secondary | ICD-10-CM | POA: Insufficient documentation

## 2015-12-31 DIAGNOSIS — K0499 Other diseases of pulp and periapical tissues: Secondary | ICD-10-CM | POA: Insufficient documentation

## 2015-12-31 DIAGNOSIS — Z87891 Personal history of nicotine dependence: Secondary | ICD-10-CM | POA: Insufficient documentation

## 2015-12-31 DIAGNOSIS — I10 Essential (primary) hypertension: Secondary | ICD-10-CM | POA: Insufficient documentation

## 2015-12-31 DIAGNOSIS — Y9389 Activity, other specified: Secondary | ICD-10-CM | POA: Insufficient documentation

## 2015-12-31 DIAGNOSIS — Z79899 Other long term (current) drug therapy: Secondary | ICD-10-CM | POA: Insufficient documentation

## 2015-12-31 MED ORDER — IBUPROFEN 600 MG PO TABS: 600.0000 mg | ORAL_TABLET | Freq: Four times a day (QID) | ORAL | Status: DC | PRN

## 2015-12-31 MED ORDER — AMOXICILLIN 500 MG PO CAPS: 500.0000 mg | ORAL_CAPSULE | Freq: Three times a day (TID) | ORAL | Status: DC

## 2015-12-31 MED ORDER — TRAMADOL HCL 50 MG PO TABS: 50.0000 mg | ORAL_TABLET | Freq: Four times a day (QID) | ORAL | Status: DC | PRN

## 2015-12-31 NOTE — Discharge Instructions (Signed)
Follow-up from list of dental pain is provided. OPTIONS FOR DENTAL FOLLOW UP CARE  [place] [facility] and [place] [email or web address] [phone])  Miguel [name] [phone])  [place] [phone])  [place] [phone])  [facility] [phone]) This clinic caters to the indigent population and is on a lottery system. Location: [employer] [facility], [place], [place], [place] [facility] Hours: Wednesdays from 6pm - 9pm, patients seen by a lottery system. For dates, call or go to [email or web address] Services: Cleanings, fillings and simple extractions. Payment Options: DENTAL WORK IS FREE OF CHARGE. Bring proof of income or support. Best way to get seen: Arrive at 5:15 pm - this is a lottery, NOT first come/first serve, so arriving earlier will not increase your chances of being seen.     [facility] [phone] Select option 1 for emergencies   Location: [facility], [place], [address], [place] Clinic Hours: No walk-ins accepted - call the day before to schedule an appointment. Check in times are 9:30 am and 1:30 pm. Services: Simple extractions, temporary fillings, pulpectomy/pulp debridement, uncomplicated abscess drainage. Payment Options: PAYMENT IS DUE AT THE TIME OF SERVICE.  Fee is usually $100-200, additional surgical procedures (e.g. abscess drainage) may be extra. Cash, checks, Visa/MasterCard accepted.  Can file Medicaid if patient is covered for dental - patient should call case worker to check. No discount for [facility] patients. Best way to get seen: MUST call the day before and get onto the schedule. Can usually be seen the next 1-2 days. No walk-ins accepted.      [place] [phone]   Location: [place], [place] [facility] Hours: M, W, Th, F 8am or 1:30pm, Tues 9a or 1:30 - first come/first served. Services: Simple extractions, temporary fillings, uncomplicated abscess drainage.  You do not need to be an [facility] resident. Payment Options: PAYMENT IS DUE AT THE TIME OF SERVICE. Dental insurance, otherwise sliding scale - bring proof of income or support. Depending on income and treatment needed, cost is usually $50-200. Best way to get seen: Arrive early as it is first come/first served.     [facility] [phone]   Location: [place] [facility] Hours: Mon-Thu 8a-5p Services: Most basic dental services including extractions and fillings. Payment Options: PAYMENT IS DUE AT THE TIME OF SERVICE. Sliding scale, up to 50% off - bring proof if income or support. Medicaid with dental option accepted. Best way to get seen: Call to schedule an appointment, can usually be seen within 2 weeks OR they will try to see walk-ins - show up at [place] or 2p (you may have to wait).     [facility] [place] RESIDENTS ONLY   Location: [facility], [place] [address], [place], [place] [address] Clinic Hours: By appointment only. Monday - Thursday 8am-5pm, Friday 8am-12pm Services: Cleanings, fillings, extractions. Payment Options: PAYMENT IS DUE AT THE TIME OF SERVICE. Cash, Visa or MasterCard. Sliding scale - $30 minimum per service. Best way to get seen: Come in to office, complete packet and make an appointment - need proof of income or support monies for each household member and proof of [facility] residence. Usually takes about a month to get in.     [facility] °[phone] °  °Location: °[address] [place] °Clinic Hours: Walk-in Urgent Care  Dental Services are offered Monday-Friday mornings only. °The numbers of emergencies accepted daily is limited to the number of °providers available. °Maximum 15 - Mondays, Wednesdays & Thursdays °Maximum 10 - Tuesdays & Fridays °Services: °You do not need to be a [place] resident to be seen for a dental emergency. °Emergencies are defined as pain, swelling, abnormal bleeding, or dental trauma. Walkins will receive x-rays if needed. °NOTE: Dental cleaning is not an emergency. °Payment Options: °PAYMENT IS DUE AT THE TIME OF SERVICE. °Minimum co-pay is $40.00 for uninsured patients. °Minimum co-pay is $3.00 for Medicaid with dental coverage. °Dental Insurance is accepted and must be presented at time of visit. °Medicare does not cover dental. °Forms of payment: Cash, credit card, checks. °Best way to get seen: °If not previously registered with the clinic, walk-in dental registration begins at 7:15 am and is on a first come/first serve basis. °If previously registered with the clinic, call to make an appointment. °  °  °[facility] °[phone] °[place] RESIDENTS ONLY °  °Location: °[address], [place], [place] °Clinic Hours: °Mon-Thu 10a-2p °Services: Extractions only! °Payment Options: °FREE (donations accepted) - bring proof of income or support °Best way to get seen: °Call and schedule an appointment OR come at 8am on the 1st Monday of every month (except for holidays) when it is first come/first served. °  °  °[facility] °[phone] °  °Location: °[address], [place] °Clinic Hours: °Friday mornings °Services, Payment Options, Best way to get seen: °Call for info ° °

## 2015-12-31 NOTE — ED Provider Notes (Signed)
[facility] Emergency Department Provider Note  ____________________________________________  Time seen: Approximately 2:16 PM  I have reviewed the triage vital signs and the nursing notes.   HISTORY  Chief Complaint Dental Pain    HPI Miguel [name] is a 55 y.o. male patient complaining of dental pain since last night. Patient said he bit down hard on some food upon of his teeth broke off. Patient state he is has already made arrangements to see a dentist lipoma will not before a couple weeks. Patient seen a dentist for his multiple devitalized teeth. Patient denies any fevers chills associated this complaint. He denies any swelling. Patient rates his pain as a 10 over 10. No palliative measures taken for this complaint. Patient has history of hypertension and is noncompliant with medications.   Past Medical History  Diagnosis Date  . Hypertension   . Gout   . Renal colic   . Asthma     There are no active problems to display for this patient.   Past Surgical History  Procedure Laterality Date  . Cholecystectomy      Current Outpatient Rx  Name  Route  Sig  Dispense  Refill  . allopurinol (ZYLOPRIM) 100 MG tablet   Oral   Take 100 mg by mouth daily.         Marland Kitchen amoxicillin (AMOXIL) 500 MG capsule   Oral   Take 1 capsule (500 mg total) by mouth 3 (three) times daily.   30 capsule   0   . colchicine 0.6 MG tablet   Oral   Take 0.6 mg by mouth daily.         Marland Kitchen esomeprazole (NEXIUM) 40 MG capsule   Oral   Take 40 mg by mouth daily at 12 noon.         Marland Kitchen ibuprofen (ADVIL,MOTRIN) 600 MG tablet   Oral   Take 1 tablet (600 mg total) by mouth every 6 (six) hours as needed.   30 tablet   0   . lisinopril (PRINIVIL,ZESTRIL) 20 MG tablet   Oral   Take 20 mg by mouth daily.         Marland Kitchen LORazepam (ATIVAN) 1 MG tablet   Oral   Take 1 tablet (1 mg total) by mouth every 8 (eight) hours as needed for anxiety.   15 tablet   0   .  oxyCODONE-acetaminophen (ROXICET) 5-325 MG tablet   Oral   Take 1 tablet by mouth every 4 (four) hours as needed for severe pain.   15 tablet   0   . traMADol (ULTRAM) 50 MG tablet   Oral   Take 1 tablet (50 mg total) by mouth every 6 (six) hours as needed.   20 tablet   0     Allergies Review of patient's allergies indicates no known allergies.  No family history on file.  Social History Social History  Substance Use Topics  . Smoking status: Former Research scientist (life sciences)  . Smokeless tobacco: None  . Alcohol Use: No     Comment: occasionally    Review of Systems Constitutional: No fever/chills Eyes: No visual changes. ENT: No sore throat. Dental pain Cardiovascular: Denies chest pain. Respiratory: Denies shortness of breath. Gastrointestinal: No abdominal pain.  No nausea, no vomiting.  No diarrhea.  No constipation. Genitourinary: Negative for dysuria. Musculoskeletal: Negative for back pain. Skin: Negative for rash. Neurological: Negative for headaches, focal weakness or numbness. 10-point ROS otherwise negative.  ____________________________________________   PHYSICAL  EXAM:  VITAL SIGNS: ED Triage Vitals  Enc Vitals Group     BP 12/31/15 1316 174/94 mmHg     Pulse Rate 12/31/15 1314 106     Resp 12/31/15 1314 16     Temp 12/31/15 1314 97.4 F (36.3 C)     Temp Source 12/31/15 1314 Oral     SpO2 12/31/15 1314 100 %     Weight 12/31/15 1314 200 lb (90.719 kg)     Height 12/31/15 1314 6' (1.829 m)     Head Cir --      Peak Flow --      Pain Score 12/31/15 1315 10     Pain Loc --      Pain Edu? --      Excl. in [place]? --     Constitutional: Alert and oriented. Well appearing and in no acute distress. Eyes: Conjunctivae are normal. PERRL. EOMI. Head: Atraumatic. Nose: No congestion/rhinnorhea. Mouth/Throat: Mucous membranes are moist.  Oropharynx non-erythematous. Multiple devitalized and fractured teeth. #11 this point of concern which is fractured. Neck: No  stridor. No cervical spine tenderness to palpation. Hematological/Lymphatic/Immunilogical: No cervical lymphadenopathy. Cardiovascular: Normal rate, regular rhythm. Grossly normal heart sounds.  Good peripheral circulation. Respiratory: Normal respiratory effort.  No retractions. Lungs CTAB. Gastrointestinal: Soft and nontender. No distention. No abdominal bruits. No CVA tenderness. Musculoskeletal: No lower extremity tenderness nor edema.  No joint effusions. Neurologic:  Normal speech and language. No gross focal neurologic deficits are appreciated. No gait instability. Skin:  Skin is warm, dry and intact. No rash noted. Psychiatric: Mood and affect are normal. Speech and behavior are normal.  ____________________________________________   LABS (all labs ordered are listed, but only abnormal results are displayed)  Labs Reviewed - No data to display ____________________________________________  EKG   ____________________________________________  RADIOLOGY   ____________________________________________   PROCEDURES  Procedure(s) performed: None  Critical Care performed: No  ____________________________________________   INITIAL IMPRESSION / ASSESSMENT AND PLAN / ED COURSE  Pertinent labs & imaging results that were available during my care of the patient were reviewed by me and considered in my medical decision making (see chart for details).  Dental pain secondary to fractured tooth. Patient was started on tramadol, and ibuprofen,  and Amoxil. Advised to follow-up with pending dental appointment or chose from the list of dental clinics provided. FINAL CLINICAL IMPRESSION(S) / ED DIAGNOSES  Final diagnoses:  Pain, dental      Sable Feil, PA-C 12/31/15 1431  Lavonia Drafts, MD 12/31/15 [phone]

## 2015-12-31 NOTE — ED Notes (Signed)
C/o left upper tooth pain.  Onset of symptoms last night.

## 2016-01-06 ENCOUNTER — Encounter: Payer: Self-pay | Admitting: Emergency Medicine

## 2016-01-06 ENCOUNTER — Emergency Department
Admission: EM | Admit: 2016-01-06 | Discharge: 2016-01-06 | Disposition: A | Discharge status: Home / Self Care | Payer: Self-pay | Attending: Emergency Medicine | Admitting: Emergency Medicine

## 2016-01-06 DIAGNOSIS — I10 Essential (primary) hypertension: Secondary | ICD-10-CM | POA: Insufficient documentation

## 2016-01-06 DIAGNOSIS — Z87891 Personal history of nicotine dependence: Secondary | ICD-10-CM | POA: Insufficient documentation

## 2016-01-06 DIAGNOSIS — Z792 Long term (current) use of antibiotics: Secondary | ICD-10-CM | POA: Insufficient documentation

## 2016-01-06 DIAGNOSIS — F419 Anxiety disorder, unspecified: Secondary | ICD-10-CM | POA: Insufficient documentation

## 2016-01-06 DIAGNOSIS — Z79899 Other long term (current) drug therapy: Secondary | ICD-10-CM | POA: Insufficient documentation

## 2016-01-06 MED ORDER — LORAZEPAM 1 MG PO TABS
1.0000 mg | ORAL_TABLET | Freq: Once | ORAL | Status: AC
  Administered 2016-01-06: 1 mg via ORAL
  Filled 2016-01-06: qty 1

## 2016-01-06 MED ORDER — ONDANSETRON 4 MG PO TBDP
4.0000 mg | ORAL_TABLET | Freq: Once | ORAL | Status: AC
  Administered 2016-01-06: 4 mg via ORAL

## 2016-01-06 MED ORDER — ONDANSETRON 4 MG PO TBDP
ORAL_TABLET | ORAL | Status: AC
  Filled 2016-01-06: qty 1

## 2016-01-06 NOTE — ED Notes (Signed)
Pt presents with anxiety started four hours ago. Denies any HI or SI.

## 2016-01-06 NOTE — Discharge Instructions (Signed)

## 2016-01-06 NOTE — ED Notes (Signed)
Pt educated for discharge.  Pt verbalizes understanding of followup care with Medication Management in 2 days from now.

## 2016-01-06 NOTE — ED Provider Notes (Signed)
[facility] Emergency Department Provider Note  ____________________________________________  Time seen: 8:15 AM  I have reviewed the triage vital signs and the nursing notes.   HISTORY  Chief Complaint Anxiety; Nausea; and Emesis     HPI Miguel [name] is a 55 y.o. male presents with "anxiety" with onset at 3 AM this morning. Patient states that he "I was just really restless tossing and turning couldn't sleep". Patient denies any chest pain or shortness of breath no nausea vomiting dizziness or headache. Patient does admit to heavy EtOH use daily     Past Medical History  Diagnosis Date  . Hypertension   . Gout   . Renal colic   . Asthma     There are no active problems to display for this patient.   Past Surgical History  Procedure Laterality Date  . Cholecystectomy      Current Outpatient Rx  Name  Route  Sig  Dispense  Refill  . allopurinol (ZYLOPRIM) 100 MG tablet   Oral   Take 100 mg by mouth daily.         Marland Kitchen amoxicillin (AMOXIL) 500 MG capsule   Oral   Take 1 capsule (500 mg total) by mouth 3 (three) times daily.   30 capsule   0   . colchicine 0.6 MG tablet   Oral   Take 0.6 mg by mouth daily.         Marland Kitchen esomeprazole (NEXIUM) 40 MG capsule   Oral   Take 40 mg by mouth daily at 12 noon.         Marland Kitchen ibuprofen (ADVIL,MOTRIN) 600 MG tablet   Oral   Take 1 tablet (600 mg total) by mouth every 6 (six) hours as needed.   30 tablet   0   . lisinopril (PRINIVIL,ZESTRIL) 20 MG tablet   Oral   Take 20 mg by mouth daily.         Marland Kitchen LORazepam (ATIVAN) 1 MG tablet   Oral   Take 1 tablet (1 mg total) by mouth every 8 (eight) hours as needed for anxiety.   15 tablet   0   . oxyCODONE-acetaminophen (ROXICET) 5-325 MG tablet   Oral   Take 1 tablet by mouth every 4 (four) hours as needed for severe pain.   15 tablet   0   . traMADol (ULTRAM) 50 MG tablet   Oral   Take 1 tablet (50 mg total) by mouth every 6 (six) hours as  needed.   20 tablet   0     Allergies No known drug allergies No family history on file.  Social History Social History  Substance Use Topics  . Smoking status: Former Research scientist (life sciences)  . Smokeless tobacco: None  . Alcohol Use: No     Comment: occasionally    Review of Systems  Constitutional: Negative for fever. Eyes: Negative for visual changes. ENT: Negative for sore throat. Cardiovascular: Negative for chest pain. Respiratory: Negative for shortness of breath. Gastrointestinal: Negative for abdominal pain, vomiting and diarrhea. Genitourinary: Negative for dysuria. Musculoskeletal: Negative for back pain. Skin: Negative for rash. Neurological: Negative for headaches, focal weakness or numbness. Psychiatric: Positive for anxiety  10-point ROS otherwise negative.  ____________________________________________   PHYSICAL EXAM:  VITAL SIGNS: ED Triage Vitals  Enc Vitals Group     BP 01/06/16 0736 194/117 mmHg     Pulse Rate 01/06/16 0734 100     Resp 01/06/16 0734 22  Temp 01/06/16 0734 98.2 F (36.8 C)     Temp Source 01/06/16 0734 Oral     SpO2 01/06/16 0825 100 %     Weight 01/06/16 0734 200 lb (90.719 kg)     Height 01/06/16 0734 6' (1.829 m)     Head Cir --      Peak Flow --      Pain Score --      Pain Loc --      Pain Edu? --      Excl. in [place]? --      Constitutional: Alert and oriented. Well appearing and in no distress. Eyes: Conjunctivae are normal. PERRL. Normal extraocular movements. ENT   Head: Normocephalic and atraumatic.   Nose: No congestion/rhinnorhea.   Mouth/Throat: Mucous membranes are moist.   Neck: No stridor. Hematological/Lymphatic/Immunilogical: No cervical lymphadenopathy. Cardiovascular: Normal rate, regular rhythm. Normal and symmetric distal pulses are present in all extremities. No murmurs, rubs, or gallops. Respiratory: Normal respiratory effort without tachypnea nor retractions. Breath sounds are clear and equal  bilaterally. No wheezes/rales/rhonchi. Gastrointestinal: Soft and nontender. No distention. There is no CVA tenderness. Genitourinary: deferred Musculoskeletal: Nontender with normal range of motion in all extremities. No joint effusions.  No lower extremity tenderness nor edema. Neurologic:  Normal speech and language. No gross focal neurologic deficits are appreciated. Speech is normal.  Skin:  Skin is warm, dry and intact. No rash noted. Psychiatric: Anxious affect. Speech and behavior are normal. Patient exhibits appropriate insight and judgment.     INITIAL IMPRESSION / ASSESSMENT AND PLAN / ED COURSE  Pertinent labs & imaging results that were available during my care of the patient were reviewed by me and considered in my medical decision making (see chart for details).  Patient received Ativan 1 mg by mouth with improvement of symptoms.  ____________________________________________   FINAL CLINICAL IMPRESSION(S) / ED DIAGNOSES  Final diagnoses:  Anxiety      Gregor Hams, MD 01/07/16 1530

## 2016-01-17 ENCOUNTER — Encounter: Payer: Self-pay | Admitting: Emergency Medicine

## 2016-01-17 ENCOUNTER — Emergency Department
Admission: EM | Admit: 2016-01-17 | Discharge: 2016-01-17 | Disposition: A | Discharge status: Home / Self Care | Payer: Self-pay | Attending: Emergency Medicine | Admitting: Emergency Medicine

## 2016-01-17 DIAGNOSIS — Z87891 Personal history of nicotine dependence: Secondary | ICD-10-CM | POA: Insufficient documentation

## 2016-01-17 DIAGNOSIS — F419 Anxiety disorder, unspecified: Secondary | ICD-10-CM | POA: Insufficient documentation

## 2016-01-17 DIAGNOSIS — Z792 Long term (current) use of antibiotics: Secondary | ICD-10-CM | POA: Insufficient documentation

## 2016-01-17 DIAGNOSIS — K0889 Other specified disorders of teeth and supporting structures: Secondary | ICD-10-CM | POA: Insufficient documentation

## 2016-01-17 DIAGNOSIS — I10 Essential (primary) hypertension: Secondary | ICD-10-CM | POA: Insufficient documentation

## 2016-01-17 DIAGNOSIS — Z79899 Other long term (current) drug therapy: Secondary | ICD-10-CM | POA: Insufficient documentation

## 2016-01-17 NOTE — ED Provider Notes (Signed)
[facility] Emergency Department Provider Note  ____________________________________________  Time seen: On arrival  I have reviewed the triage vital signs and the nursing notes.   HISTORY  Chief Complaint Anxiety and Dental Pain    HPI Miguel [name] is a 55 y.o. male who presents with complaints of anxiety and dental pain. Patient went to the dentist today because of continued dental pain and they referred him to an oral surgeon. He is now anxious about having to go to an oral surgeon so he came to the emergency room. No HI or SI. He denies depression. He does abuse alcohol but has no interest in a detox program this time.    Past Medical History  Diagnosis Date  . Hypertension   . Gout   . Renal colic   . Asthma     There are no active problems to display for this patient.   Past Surgical History  Procedure Laterality Date  . Cholecystectomy      Current Outpatient Rx  Name  Route  Sig  Dispense  Refill  . allopurinol (ZYLOPRIM) 100 MG tablet   Oral   Take 100 mg by mouth daily.         Marland Kitchen amoxicillin (AMOXIL) 500 MG capsule   Oral   Take 1 capsule (500 mg total) by mouth 3 (three) times daily.   30 capsule   0   . colchicine 0.6 MG tablet   Oral   Take 0.6 mg by mouth daily.         Marland Kitchen esomeprazole (NEXIUM) 40 MG capsule   Oral   Take 40 mg by mouth daily at 12 noon.         Marland Kitchen ibuprofen (ADVIL,MOTRIN) 600 MG tablet   Oral   Take 1 tablet (600 mg total) by mouth every 6 (six) hours as needed.   30 tablet   0   . lisinopril (PRINIVIL,ZESTRIL) 20 MG tablet   Oral   Take 20 mg by mouth daily.         Marland Kitchen LORazepam (ATIVAN) 1 MG tablet   Oral   Take 1 tablet (1 mg total) by mouth every 8 (eight) hours as needed for anxiety.   15 tablet   0   . oxyCODONE-acetaminophen (ROXICET) 5-325 MG tablet   Oral   Take 1 tablet by mouth every 4 (four) hours as needed for severe pain.   15 tablet   0   . traMADol (ULTRAM) 50 MG  tablet   Oral   Take 1 tablet (50 mg total) by mouth every 6 (six) hours as needed.   20 tablet   0     Allergies Review of patient's allergies indicates no known allergies.  No family history on file.  Social History Social History  Substance Use Topics  . Smoking status: Former Research scientist (life sciences)  . Smokeless tobacco: None  . Alcohol Use: Yes     Comment: occasionally    Review of Systems  Constitutional: Negative for fever.  ENT: No intraoral swelling  cardiovascular: No chest pain  Musculoskeletal: Negative for back pain. Skin: Negative for rash. Neurological: Negative for headaches    ____________________________________________   PHYSICAL EXAM:  VITAL SIGNS: ED Triage Vitals  Enc Vitals Group     BP 01/17/16 1415 150/99 mmHg     Pulse Rate 01/17/16 1415 72     Resp 01/17/16 1415 16     Temp 01/17/16 1410 98.8 F (37.1 C)  Temp Source 01/17/16 1410 Oral     SpO2 01/17/16 1415 99 %     Weight 01/17/16 1415 200 lb (90.719 kg)     Height 01/17/16 1415 6' (1.829 m)     Head Cir --      Peak Flow --      Pain Score 01/17/16 1416 8     Pain Loc --      Pain Edu? --      Excl. in [place]? --      Constitutional: Alert and oriented. Well appearing and in no distress. No evidence of withdrawal Eyes: Conjunctivae are normal.  ENT   Head: Normocephalic and atraumatic.   Mouth/Throat: Mucous membranes are moist. No intraoral swelling, no drainable abscess. Poor dentition overall Cardiovascular: Normal rate, regular rhythm.  Respiratory: Normal respiratory effort without tachypnea nor retractions.    Neurologic:  Normal speech and language. No gross focal neurologic deficits are appreciated. Skin:  Skin is warm, dry and intact. No rash noted. Psychiatric: Anxious mood, normal affect  ____________________________________________    LABS (pertinent positives/negatives)  Labs Reviewed - No data to  display  ____________________________________________     ____________________________________________    RADIOLOGY I have personally reviewed any xrays that were ordered on this patient: None  ____________________________________________   PROCEDURES  Procedure(s) performed: none   ____________________________________________   INITIAL IMPRESSION / ASSESSMENT AND PLAN / ED COURSE  Pertinent labs & imaging results that were available during my care of the patient were reviewed by me and considered in my medical decision making (see chart for details).  Patient well-appearing and in no distress. No evidence of alcohol withdrawal. Mild anxiety regarding upcoming tooth extraction. Reassurance provided.  Appropriate for discharge as no SI or HI or depression  ____________________________________________   FINAL CLINICAL IMPRESSION(S) / ED DIAGNOSES  Final diagnoses:  Anxiety  Pain, dental     Lavonia Drafts, MD 01/17/16 1444

## 2016-01-17 NOTE — ED Notes (Signed)
Pt here with anxiety and dental pain; pt saw dentist on 3/1 after removing tooth himself at home.

## 2016-01-17 NOTE — Discharge Instructions (Signed)
Generalized Anxiety Disorder Generalized anxiety disorder (GAD) is a mental disorder. It interferes with life functions, including relationships, work, and school. GAD is different from normal anxiety, which everyone experiences at some point in their lives in response to specific life events and activities. Normal anxiety actually helps us prepare for and get through these life events and activities. Normal anxiety goes away after the event or activity is over.  GAD causes anxiety that is not necessarily related to specific events or activities. It also causes excess anxiety in proportion to specific events or activities. The anxiety associated with GAD is also difficult to control. GAD can vary from mild to severe. People with severe GAD can have intense waves of anxiety with physical symptoms (panic attacks).  SYMPTOMS The anxiety and worry associated with GAD are difficult to control. This anxiety and worry are related to many life events and activities and also occur more days than not for 6 months or longer. People with GAD also have three or more of the following symptoms (one or more in children):  Restlessness.   Fatigue.  Difficulty concentrating.   Irritability.  Muscle tension.  Difficulty sleeping or unsatisfying sleep. DIAGNOSIS GAD is diagnosed through an assessment by your health care provider. Your health care provider will ask you questions aboutyour mood,physical symptoms, and events in your life. Your health care provider may ask you about your medical history and use of alcohol or drugs, including prescription medicines. Your health care provider may also do a physical exam and blood tests. Certain medical conditions and the use of certain substances can cause symptoms similar to those associated with GAD. Your health care provider may refer you to a mental health specialist for further evaluation. TREATMENT The following therapies are usually used to treat GAD:    Medication. Antidepressant medication usually is prescribed for long-term daily control. Antianxiety medicines may be added in severe cases, especially when panic attacks occur.   Talk therapy (psychotherapy). Certain types of talk therapy can be helpful in treating GAD by providing support, education, and guidance. A form of talk therapy called cognitive behavioral therapy can teach you healthy ways to think about and react to daily life events and activities.  Stress managementtechniques. These include yoga, meditation, and exercise and can be very helpful when they are practiced regularly. A mental health specialist can help determine which treatment is best for you. Some people see improvement with one therapy. However, other people require a combination of therapies.   This information is not intended to replace advice given to you by your health care provider. Make sure you discuss any questions you have with your health care provider.   Document Released: 02/27/2013 Document Revised: 11/23/2014 Document Reviewed: 02/27/2013 Elsevier Interactive Patient Education 2016 [employer]  

## 2016-01-17 NOTE — ED Notes (Signed)
Per MD Kinner, not to d/c pt at this time until pt becomes more calm. Pt made aware and verbalized understanding. Pt in room, pacing, watching the TV. Pt stable, NAD noted at this time.

## 2016-01-26 ENCOUNTER — Encounter: Payer: Self-pay | Admitting: Emergency Medicine

## 2016-01-26 ENCOUNTER — Emergency Department
Admission: EM | Admit: 2016-01-26 | Discharge: 2016-01-26 | Discharge status: Against Medical Advice | Payer: Self-pay | Attending: Emergency Medicine | Admitting: Emergency Medicine

## 2016-01-26 DIAGNOSIS — F419 Anxiety disorder, unspecified: Secondary | ICD-10-CM | POA: Insufficient documentation

## 2016-01-26 DIAGNOSIS — F1010 Alcohol abuse, uncomplicated: Secondary | ICD-10-CM | POA: Insufficient documentation

## 2016-01-26 DIAGNOSIS — R000 Tachycardia, unspecified: Secondary | ICD-10-CM | POA: Insufficient documentation

## 2016-01-26 DIAGNOSIS — I10 Essential (primary) hypertension: Secondary | ICD-10-CM | POA: Insufficient documentation

## 2016-01-26 DIAGNOSIS — Z792 Long term (current) use of antibiotics: Secondary | ICD-10-CM | POA: Insufficient documentation

## 2016-01-26 DIAGNOSIS — Z79899 Other long term (current) drug therapy: Secondary | ICD-10-CM | POA: Insufficient documentation

## 2016-01-26 DIAGNOSIS — Z87891 Personal history of nicotine dependence: Secondary | ICD-10-CM | POA: Insufficient documentation

## 2016-01-26 MED ORDER — ONDANSETRON 4 MG PO TBDP
4.0000 mg | ORAL_TABLET | Freq: Once | ORAL | Status: AC
  Administered 2016-01-26: 4 mg via ORAL
  Filled 2016-01-26: qty 1

## 2016-01-26 NOTE — ED Provider Notes (Signed)
[facility] Emergency Department Provider Note  ____________________________________________    I have reviewed the triage vital signs and the nursing notes.   HISTORY  Chief Complaint Emesis; Diarrhea; and Anxiety    HPI Miguel [name] is a 55 y.o. male who presents with complaints of anxiety. Patient is a frequent visitor to our ED for complaints of anxiety. Today he reports that he is considering attempting detox from alcohol although he does not want to do a program where he cannot go home. He complains that if he does not drink he develops nausea or diarrhea. He has no physical complaints now except for anxiety for which he requests benzodiazepines     Past Medical History  Diagnosis Date  . Hypertension   . Gout   . Renal colic   . Asthma     There are no active problems to display for this patient.   Past Surgical History  Procedure Laterality Date  . Cholecystectomy      Current Outpatient Rx  Name  Route  Sig  Dispense  Refill  . allopurinol (ZYLOPRIM) 100 MG tablet   Oral   Take 100 mg by mouth daily.         Marland Kitchen amoxicillin (AMOXIL) 500 MG capsule   Oral   Take 1 capsule (500 mg total) by mouth 3 (three) times daily.   30 capsule   0   . colchicine 0.6 MG tablet   Oral   Take 0.6 mg by mouth daily.         Marland Kitchen esomeprazole (NEXIUM) 40 MG capsule   Oral   Take 40 mg by mouth daily at 12 noon.         Marland Kitchen ibuprofen (ADVIL,MOTRIN) 600 MG tablet   Oral   Take 1 tablet (600 mg total) by mouth every 6 (six) hours as needed.   30 tablet   0   . lisinopril (PRINIVIL,ZESTRIL) 20 MG tablet   Oral   Take 20 mg by mouth daily.         Marland Kitchen LORazepam (ATIVAN) 1 MG tablet   Oral   Take 1 tablet (1 mg total) by mouth every 8 (eight) hours as needed for anxiety.   15 tablet   0   . oxyCODONE-acetaminophen (ROXICET) 5-325 MG tablet   Oral   Take 1 tablet by mouth every 4 (four) hours as needed for severe pain.   15 tablet   0   . traMADol (ULTRAM) 50 MG tablet   Oral   Take 1 tablet (50 mg total) by mouth every 6 (six) hours as needed.   20 tablet   0     Allergies Review of patient's allergies indicates no known allergies.  No family history on file.  Social History Social History  Substance Use Topics  . Smoking status: Former Research scientist (life sciences)  . Smokeless tobacco: None  . Alcohol Use: Yes     Comment: occasionally    Review of Systems  Constitutional: Negative for fever. Eyes: Negative for visual changes. ENT: Negative for sore throat Cardiovascular: Negative for chest pain. Respiratory: Negative for shortness of breath.No cough  Gastrointestinal: Negative for abdominal pain, Genitourinary: Negative for dysuria. Musculoskeletal: Negative for back pain. Skin: Negative for rash. Neurological: Negative for headaches or focal weakness Psychiatric:Anxious    ____________________________________________   PHYSICAL EXAM:  VITAL SIGNS: ED Triage Vitals  Enc Vitals Group     BP 01/26/16 1253 138/94 mmHg     Pulse Rate  01/26/16 1253 115     Resp 01/26/16 1253 24     Temp 01/26/16 1253 98.3 F (36.8 C)     Temp src --      SpO2 01/26/16 1253 100 %     Weight --      Height --      Head Cir --      Peak Flow --      Pain Score --      Pain Loc --      Pain Edu? --      Excl. in [place]? --      Constitutional: Alert and oriented. Well appearing and in no distress. Eyes: Conjunctivae are normal.  ENT   Head: Normocephalic and atraumatic.   Mouth/Throat: Mucous membranes are moist. Cardiovascular: Tachycardia, regular rhythm. Heart rate 100 and my exam area Normal and symmetric distal pulses are present in all extremities.  Respiratory: Normal respiratory effort without tachypnea nor retractions. .  Gastrointestinal: Soft and non-tender in all quadrants. No distention. There is no CVA tenderness. Genitourinary: deferred Musculoskeletal: Nontender with normal range of motion in all  extremities. No lower extremity tenderness nor edema. Neurologic:  Normal speech and language. No gross focal neurologic deficits are appreciated. Skin:  Skin is warm, dry and intact. No rash noted. Psychiatric: Mood and affect are normal. Patient exhibits appropriate insight and judgment.  ____________________________________________    LABS (pertinent positives/negatives)  Labs Reviewed - No data to display  ____________________________________________   EKG None  ____________________________________________    RADIOLOGY I have personally reviewed any xrays that were ordered on this patient: None  ____________________________________________   PROCEDURES  Procedure(s) performed: none  Critical Care performed: none  ____________________________________________   INITIAL IMPRESSION / ASSESSMENT AND PLAN / ED COURSE  Pertinent labs & imaging results that were available during my care of the patient were reviewed by me and considered in my medical decision making (see chart for details).  Patient is well-appearing and in no distress. His heart rate is improved when he is calm and no one in the room. I will consult TTS for possible placement RTS  Patient has informed the nurse that he wants to leave AMA. He is no longer willing to attempt detox. He understands that he is leaving [place] and understands the risk of withdrawal symptoms including seizures and potentially death. ____________________________________________   FINAL CLINICAL IMPRESSION(S) / ED DIAGNOSES  Final diagnoses:  Alcohol abuse  Anxiety     Lavonia Drafts, MD 01/26/16 1523

## 2016-01-26 NOTE — ED Notes (Signed)
Patient states his last drink was yesterday. States he is very anxious and wants to stop drinking. MD told patient we can start process for him to detox. Patient agrees. Patient is asking for medication for his anxiety, MD declines at this time.

## 2016-01-26 NOTE — ED Notes (Signed)
Patient pacing, stating he cannot stay here. He states he needs to get home to his wife. States he will go to rehab in the AM. States he has the number and address. MD aware.

## 2016-01-26 NOTE — ED Notes (Signed)
Patient presents to the ED with multiple episodes of vomiting and diarrhea.  Patient is shaking/has tremors in triage.  Patient reports these symptoms began last night.  Patient states, "this happens every time I drink".  This RN clarified that patient meant alcohol.  Patient confirmed this.  Patient states this has occurred multiple times.  Patient reports plans to go to [facility] to discuss detoxing from alcohol tomorrow.

## 2016-01-28 ENCOUNTER — Emergency Department: Payer: Self-pay

## 2016-01-28 ENCOUNTER — Emergency Department
Admission: EM | Admit: 2016-01-28 | Discharge: 2016-01-28 | Disposition: A | Discharge status: Home / Self Care | Payer: Self-pay | Attending: Emergency Medicine | Admitting: Emergency Medicine

## 2016-01-28 ENCOUNTER — Encounter: Payer: Self-pay | Admitting: Emergency Medicine

## 2016-01-28 DIAGNOSIS — N289 Disorder of kidney and ureter, unspecified: Secondary | ICD-10-CM | POA: Insufficient documentation

## 2016-01-28 DIAGNOSIS — F419 Anxiety disorder, unspecified: Secondary | ICD-10-CM | POA: Insufficient documentation

## 2016-01-28 DIAGNOSIS — Z792 Long term (current) use of antibiotics: Secondary | ICD-10-CM | POA: Insufficient documentation

## 2016-01-28 DIAGNOSIS — I10 Essential (primary) hypertension: Secondary | ICD-10-CM | POA: Insufficient documentation

## 2016-01-28 DIAGNOSIS — Z87891 Personal history of nicotine dependence: Secondary | ICD-10-CM | POA: Insufficient documentation

## 2016-01-28 DIAGNOSIS — E860 Dehydration: Secondary | ICD-10-CM | POA: Insufficient documentation

## 2016-01-28 DIAGNOSIS — Z79899 Other long term (current) drug therapy: Secondary | ICD-10-CM | POA: Insufficient documentation

## 2016-01-28 DIAGNOSIS — R0981 Nasal congestion: Secondary | ICD-10-CM | POA: Insufficient documentation

## 2016-01-28 DIAGNOSIS — F10230 Alcohol dependence with withdrawal, uncomplicated: Secondary | ICD-10-CM | POA: Insufficient documentation

## 2016-01-28 DIAGNOSIS — F10930 Alcohol use, unspecified with withdrawal, uncomplicated: Secondary | ICD-10-CM

## 2016-01-28 LAB — CBC WITH DIFFERENTIAL/PLATELET
Basophils Absolute: 0 10*3/uL (ref 0–0.1)
Basophils Relative: 1 %
Eosinophils Absolute: 0 10*3/uL (ref 0–0.7)
Eosinophils Relative: 0 %
HCT: 47.8 % (ref 40.0–52.0)
Hemoglobin: 15.8 g/dL (ref 13.0–18.0)
Lymphocytes Relative: 14 %
Lymphs Abs: 0.6 10*3/uL — ABNORMAL LOW (ref 1.0–3.6)
MCH: 29.4 pg (ref 26.0–34.0)
MCHC: 33.1 g/dL (ref 32.0–36.0)
MCV: 88.9 fL (ref 80.0–100.0)
Monocytes Absolute: 0.7 10*3/uL (ref 0.2–1.0)
Monocytes Relative: 17 %
Neutro Abs: 2.9 10*3/uL (ref 1.4–6.5)
Neutrophils Relative %: 68 %
Platelets: 92 10*3/uL — ABNORMAL LOW (ref 150–440)
RBC: 5.38 MIL/uL (ref 4.40–5.90)
RDW: 14.7 % — ABNORMAL HIGH (ref 11.5–14.5)
WBC: 4.3 10*3/uL (ref 3.8–10.6)

## 2016-01-28 LAB — COMPREHENSIVE METABOLIC PANEL
ALT: 36 U/L (ref 17–63)
AST: 37 U/L (ref 15–41)
Albumin: 4.4 g/dL (ref 3.5–5.0)
Alkaline Phosphatase: 89 U/L (ref 38–126)
Anion gap: 15 (ref 5–15)
BUN: 25 mg/dL — ABNORMAL HIGH (ref 6–20)
CO2: 25 mmol/L (ref 22–32)
Calcium: 8.7 mg/dL — ABNORMAL LOW (ref 8.9–10.3)
Chloride: 91 mmol/L — ABNORMAL LOW (ref 101–111)
Creatinine, Ser: 2.26 mg/dL — ABNORMAL HIGH (ref 0.61–1.24)
GFR calc Af Amer: 36 mL/min — ABNORMAL LOW (ref >60)
GFR calc non Af Amer: 31 mL/min — ABNORMAL LOW (ref >60)
Glucose, Bld: 113 mg/dL — ABNORMAL HIGH (ref 65–99)
Potassium: 3.3 mmol/L — ABNORMAL LOW (ref 3.5–5.1)
Sodium: 131 mmol/L — ABNORMAL LOW (ref 135–145)
Total Bilirubin: 1.7 mg/dL — ABNORMAL HIGH (ref 0.3–1.2)
Total Protein: 7.7 g/dL (ref 6.5–8.1)

## 2016-01-28 LAB — TROPONIN I: Troponin I: <0.03 ng/mL (ref <0.031)

## 2016-01-28 MED ORDER — SODIUM CHLORIDE 0.9 % IV BOLUS (SEPSIS)
1000.0000 mL | Freq: Once | INTRAVENOUS | Status: AC
  Administered 2016-01-28: 1000 mL via INTRAVENOUS

## 2016-01-28 MED ORDER — CHLORDIAZEPOXIDE HCL 25 MG PO CAPS
25.0000 mg | ORAL_CAPSULE | Freq: Once | ORAL | Status: AC
  Administered 2016-01-28: 25 mg via ORAL
  Filled 2016-01-28: qty 1

## 2016-01-28 MED ORDER — FLUTICASONE PROPIONATE 50 MCG/ACT NA SUSP: 1.0000 | Freq: Every day | NASAL | Status: DC

## 2016-01-28 MED ORDER — CHLORDIAZEPOXIDE HCL 10 MG PO CAPS: ORAL_CAPSULE | ORAL | Status: DC

## 2016-01-28 NOTE — ED Provider Notes (Signed)
[facility] Emergency Department Provider Note  Time seen: 5:02 PM  I have reviewed the triage vital signs and the nursing notes.   HISTORY  Chief Complaint Chest Pain    HPI Miguel [name] is a 55 y.o. male with a past medical history of hypertension, asthma, alcohol abuse, presents the emergency department with chest pain, difficulty sleeping, feeling very restless and anxious. According to the patient he drank heavily on Saturday and causing him to become very ill. Patient states he has been vomiting with diarrhea Saturday night, all day Sunday, and most of Monday. Denies any vomiting or diarrhea today. Patient was seen here Sunday, was awaiting behavioral medicine, however wished to leave. Patient states he went home but has not been able to keep down any food or liquid until today. Patient states he is drinking approximately 10 bottles of water but is only urinated one time a very small amount which was dark per patient. Denies any current nausea. Has not vomited or had any diarrhea since yesterday. Patient states he feels very anxious, was unable to sleep last night due to his anxiety. He is also complaining of nasal congestion.     Past Medical History  Diagnosis Date  . Hypertension   . Gout   . Renal colic   . Asthma     There are no active problems to display for this patient.   Past Surgical History  Procedure Laterality Date  . Cholecystectomy      Current Outpatient Rx  Name  Route  Sig  Dispense  Refill  . allopurinol (ZYLOPRIM) 100 MG tablet   Oral   Take 100 mg by mouth daily.         Marland Kitchen amoxicillin (AMOXIL) 500 MG capsule   Oral   Take 1 capsule (500 mg total) by mouth 3 (three) times daily.   30 capsule   0   . colchicine 0.6 MG tablet   Oral   Take 0.6 mg by mouth daily.         Marland Kitchen esomeprazole (NEXIUM) 40 MG capsule   Oral   Take 40 mg by mouth daily at 12 noon.         Marland Kitchen ibuprofen (ADVIL,MOTRIN) 600 MG tablet    Oral   Take 1 tablet (600 mg total) by mouth every 6 (six) hours as needed.   30 tablet   0   . lisinopril (PRINIVIL,ZESTRIL) 20 MG tablet   Oral   Take 20 mg by mouth daily.         Marland Kitchen LORazepam (ATIVAN) 1 MG tablet   Oral   Take 1 tablet (1 mg total) by mouth every 8 (eight) hours as needed for anxiety.   15 tablet   0   . oxyCODONE-acetaminophen (ROXICET) 5-325 MG tablet   Oral   Take 1 tablet by mouth every 4 (four) hours as needed for severe pain.   15 tablet   0   . traMADol (ULTRAM) 50 MG tablet   Oral   Take 1 tablet (50 mg total) by mouth every 6 (six) hours as needed.   20 tablet   0     Allergies Review of patient's allergies indicates no known allergies.  History reviewed. No pertinent family history.  Social History Social History  Substance Use Topics  . Smoking status: Former Research scientist (life sciences)  . Smokeless tobacco: None  . Alcohol Use: Yes     Comment: occasionally    Review of Systems  Constitutional: Negative for fever. ENT: Positive for nasal congestion Cardiovascular: Patient is pain last night. Large the resolved currently. Respiratory: Negative for shortness of breath. Gastrointestinal: Negative for abdominal pain Neurological: Negative for headache 10-point ROS otherwise negative.  ____________________________________________   PHYSICAL EXAM:  VITAL SIGNS: ED Triage Vitals  Enc Vitals Group     BP 01/28/16 1359 128/81 mmHg     Pulse Rate 01/28/16 1359 122     Resp 01/28/16 1359 20     Temp 01/28/16 1359 99.2 F (37.3 C)     Temp Source 01/28/16 1359 Oral     SpO2 01/28/16 1359 99 %     Weight 01/28/16 1359 200 lb (90.719 kg)     Height 01/28/16 1359 6' (1.829 m)     Head Cir --      Peak Flow --      Pain Score 01/28/16 1359 8     Pain Loc --      Pain Edu? --      Excl. in [place]? --     Constitutional: Alert and oriented. Very anxious appearing. Eyes: Normal exam ENT   Head: Normocephalic and atraumatic.   Mouth/Throat:  Mucous membranes are moist. Cardiovascular: Regular rhythm, rate around 110 bpm. No murmur. Respiratory: Normal respiratory effort without tachypnea nor retractions. Breath sounds are clear  Gastrointestinal: Soft and nontender. No distention.   Musculoskeletal: Nontender with normal range of motion in all extremities. Neurologic:  Normal speech and language. No gross focal neurologic deficits  Skin:  Skin is warm, dry and intact.  Psychiatric: Rapid speech, anxious appearing.  ____________________________________________    EKG  EKG reviewed and interpreted by myself shows sinus tachycardia 120 bpm, narrow QRS, left axis deviation, low voltage, overall normal intervals, nonspecific ST changes.  ____________________________________________    RADIOLOGY  Chest x-ray negative   ____________________________________________   INITIAL IMPRESSION / ASSESSMENT AND PLAN / ED COURSE  Pertinent labs & imaging results that were available during my care of the patient were reviewed by me and considered in my medical decision making (see chart for details).  Patient presents the emergency department with anxiety, last drink was approximately 3 days ago. Patient states he is wishing to quit drinking, states he is very serious about this. Patient's labs have resulted showing an elevated creatinine of 2.2 versus his baseline around 1.0. This is likely due to dehydration due to the patient's recent nausea, vomiting, diarrhea which resolved yesterday. Patient states he is drinking 10 bottles of water today but is only urinated once. I discussed with the patient admission to the hospital for IV hydration, further monitoring possible possible placement into a detox facility. Patient is adamantly against admission to the hospital states he has a sick wife at home for which she cares for and does not wish to be admitted to the hospital. Given his acute renal insufficiency we'll place an IV, hydrated with 2  L of normal saline. Patient denies any further vomiting or diarrhea. We will also dose Librium 25 mg in the emergency department. As the patient is adamantly opposed to admission to the hospital we will likely discharge home with a Librium taper. I discussed with the patient the dangers of drinking alcohol or taking benzodiazepines while on Librium, the patient understands this and swears that he will not drink any further alcohol.  Chest x-ray negative. Patient states he is feeling much better after 1 L of normal saline, current heart rate 105 bpm. Patient just started his second  liter. Plan to discharge after second liter is complete a primary care follow-up in 2 days for recheck.   Pulse rate currently 100 bpm. Decreasing with IV fluids. We will discharge patient home. I discussed with the patient the need to increase oral fluids, recheck kidney function approximate 2 days. Patient agreeable to plan.  ____________________________________________   FINAL CLINICAL IMPRESSION(S) / ED DIAGNOSES  Alcohol withdrawal Acute renal insufficiency Dehydration   Harvest Dark, MD 01/28/16 2006

## 2016-01-28 NOTE — ED Notes (Signed)

## 2016-01-28 NOTE — ED Notes (Signed)
Pt reports chest pain and cough. No resp distress,

## 2016-01-28 NOTE — ED Notes (Signed)
Patient states he drank too much on Saturday and now has anxiety, poor PO intake since Saturday, insomnia. Patient c/o bilateral lower abdomen pain and shortness of breath.

## 2016-01-28 NOTE — Discharge Instructions (Signed)
Please take your medications as prescribed. Please follow-up with [facility]/residential treatment services if you need help to stop drinking alcohol. Otherwise please follow-up with [facility] by calling the number provided to arrange a follow-up appointment to discuss your anxiety. Please follow-up your primary care physician in 2-3 days for recheck of your labs including kidney function.   Alcohol Withdrawal When a person who drinks a lot of alcohol stops drinking, he or she may go through alcohol withdrawal. Alcohol withdrawal causes problems. It can make you feel:  Tired (fatigued).  Sad (depressed).  Fearful (anxious).  Grouchy (irritable).  Not hungry.  Sick to your stomach (nauseous).  Shaky. It can also make you have:  Nightmares.  Trouble sleeping.  Trouble thinking clearly.  Mood swings.  Clammy skin.  Very bad sweating.  A very fast heartbeat.  Shaking that you cannot control (tremor).  Having a fever.  A fit of movements that you cannot control (seizure).  Confusion.  Throwing up (vomiting).  Feeling or seeing things that are not there (hallucinations). HOME CARE  Take medicines and vitamins only as told by your doctor.  Do not drink alcohol.  Have someone around in case you need help.  Drink enough fluid to keep your pee (urine) clear or pale yellow.  Think about joining a group to help you stop drinking. GET HELP IF:  Your problems get worse.  Your problems do not go away.  You cannot eat or drink without throwing up.  You are having a hard time not drinking alcohol.  You cannot stop drinking alcohol. GET HELP RIGHT AWAY IF:  You feel your heart beating differently than usual.  Your chest hurts.  You have trouble breathing.  You have very bad problems, like:  A fever.  A fit of movements that you cannot control.  Being very confused.  Feeling or seeing things that are not there.   This information is not intended to replace advice  given to you by your health care provider. Make sure you discuss any questions you have with your health care provider.   Document Released: 04/20/2008 Document Revised: 11/23/2014 Document Reviewed: 08/21/2014 Elsevier Interactive Patient Education [employer].

## 2016-01-30 ENCOUNTER — Encounter: Payer: Self-pay | Admitting: Emergency Medicine

## 2016-01-30 ENCOUNTER — Telehealth: Payer: Self-pay

## 2016-01-30 ENCOUNTER — Emergency Department
Admission: EM | Admit: 2016-01-30 | Discharge: 2016-01-31 | Disposition: A | Discharge status: Home / Self Care | Payer: Self-pay | Attending: Emergency Medicine | Admitting: Emergency Medicine

## 2016-01-30 DIAGNOSIS — Z87891 Personal history of nicotine dependence: Secondary | ICD-10-CM | POA: Insufficient documentation

## 2016-01-30 DIAGNOSIS — I10 Essential (primary) hypertension: Secondary | ICD-10-CM | POA: Insufficient documentation

## 2016-01-30 DIAGNOSIS — R258 Other abnormal involuntary movements: Secondary | ICD-10-CM | POA: Insufficient documentation

## 2016-01-30 DIAGNOSIS — J45909 Unspecified asthma, uncomplicated: Secondary | ICD-10-CM | POA: Insufficient documentation

## 2016-01-30 DIAGNOSIS — Z79899 Other long term (current) drug therapy: Secondary | ICD-10-CM | POA: Insufficient documentation

## 2016-01-30 NOTE — Telephone Encounter (Signed)
Patient needs a lab appointment and MD appointment he missed his last appointments. Please call him to reschedule. [phone]

## 2016-01-30 NOTE — ED Notes (Signed)
Patient states he has been seen in ED Sunday and Tuesday for anxiety.  Patient started on Librium and states he is feeling well, sleeping well.  Patient states that within the last hour on both hands, patient noticing middle and fourth fingers staying bent down and patient having to straighten the fingers.  Symptoms happening intermittently.   Full ROM demonstrated to hands at this time.

## 2016-01-31 NOTE — Telephone Encounter (Signed)
Made lab apt on 3/22 at 10:30 and primary care apt on 3/30 at 7pm

## 2016-02-03 ENCOUNTER — Emergency Department
Admission: EM | Admit: 2016-02-03 | Discharge: 2016-02-03 | Disposition: A | Discharge status: Home / Self Care | Payer: Self-pay | Attending: Emergency Medicine | Admitting: Emergency Medicine

## 2016-02-03 ENCOUNTER — Encounter: Payer: Self-pay | Admitting: Emergency Medicine

## 2016-02-03 DIAGNOSIS — J45909 Unspecified asthma, uncomplicated: Secondary | ICD-10-CM | POA: Insufficient documentation

## 2016-02-03 DIAGNOSIS — G8929 Other chronic pain: Secondary | ICD-10-CM

## 2016-02-03 DIAGNOSIS — M109 Gout, unspecified: Secondary | ICD-10-CM

## 2016-02-03 DIAGNOSIS — Z87891 Personal history of nicotine dependence: Secondary | ICD-10-CM | POA: Insufficient documentation

## 2016-02-03 DIAGNOSIS — I10 Essential (primary) hypertension: Secondary | ICD-10-CM | POA: Insufficient documentation

## 2016-02-03 DIAGNOSIS — Z79899 Other long term (current) drug therapy: Secondary | ICD-10-CM | POA: Insufficient documentation

## 2016-02-03 DIAGNOSIS — R000 Tachycardia, unspecified: Secondary | ICD-10-CM

## 2016-02-03 DIAGNOSIS — M10061 Idiopathic gout, right knee: Secondary | ICD-10-CM | POA: Insufficient documentation

## 2016-02-03 DIAGNOSIS — M549 Dorsalgia, unspecified: Secondary | ICD-10-CM

## 2016-02-03 DIAGNOSIS — Z7951 Long term (current) use of inhaled steroids: Secondary | ICD-10-CM | POA: Insufficient documentation

## 2016-02-03 MED ORDER — DEXAMETHASONE SODIUM PHOSPHATE 10 MG/ML IJ SOLN
10.0000 mg | Freq: Once | INTRAMUSCULAR | Status: AC
  Administered 2016-02-03: 10 mg via INTRAMUSCULAR
  Filled 2016-02-03: qty 1

## 2016-02-03 MED ORDER — COLCHICINE 0.6 MG PO TABS: ORAL_TABLET | ORAL | Status: DC

## 2016-02-03 NOTE — ED Provider Notes (Signed)
[facility] Emergency Department Provider Note  ____________________________________________  Time seen: Approximately 4:47 PM  I have reviewed the triage vital signs and the nursing notes.    HISTORY  Chief Complaint Knee Pain    HPI Miguel [name] is a 55 y.o. male chief complaint of right knee pain starting this morning. Patient has a history of gout and had colchicine at home which he took one tablet that was left over from a prescription without any relief. He did not have any refills on his medication. [facility] Thursday but was unable to get a refill on his medication and is here for gout relief. He rates his pain an 9 out of 10.   Past Medical History  Diagnosis Date  . Hypertension   . Gout   . Renal colic   . Asthma   . GERD (gastroesophageal reflux disease)     Patient Active Problem List   Diagnosis Date Noted  . Hypertension 12/05/2015  . Tachycardia 12/05/2015  . Gout 11/13/2015  . Chronic back pain 05/02/2015    Past Surgical History  Procedure Laterality Date  . Cholecystectomy  2012    Current Outpatient Rx  Name  Route  Sig  Dispense  Refill  . albuterol (VENTOLIN HFA) 108 (90 Base) MCG/ACT inhaler   Inhalation   Inhale 2 puffs into the lungs every 4 (four) hours as needed for wheezing or shortness of breath.         . allopurinol (ZYLOPRIM) 100 MG tablet   Oral   Take 100 mg by mouth daily.         Marland Kitchen amoxicillin (AMOXIL) 500 MG capsule   Oral   Take 1 capsule (500 mg total) by mouth 3 (three) times daily.   30 capsule   0   . chlordiazePOXIDE (LIBRIUM) 10 MG capsule      Day 1-2: Take 1 tablet PO Q6H Day 3-4: Take 1 tablet PO Q8H Day 5-6: Take 1 tablet PO Q12H Day 7-8: Take 1 tablet Daily.   20 capsule   0   . colchicine 0.6 MG tablet      1 tablet once or twice a day with food   40 tablet   2   . Dexlansoprazole 30 MG capsule   Oral   Take 30 mg  by mouth daily.         Marland Kitchen esomeprazole (NEXIUM) 40 MG capsule   Oral   Take 40 mg by mouth daily at 12 noon.         . fluticasone (FLONASE) 50 MCG/ACT nasal spray   Each Nare   Place 1 spray into both nostrils daily.   16 g   0   . Fluticasone-Salmeterol (ADVAIR) 100-50 MCG/DOSE AEPB   Inhalation   Inhale 1 puff into the lungs 2 (two) times daily.         Marland Kitchen gabapentin (NEURONTIN) 300 MG capsule   Oral   Take 300 mg by mouth at bedtime.         . hydrochlorothiazide (HYDRODIURIL) 25 MG tablet   Oral   Take 25 mg by mouth daily.         Marland Kitchen ibuprofen (ADVIL,MOTRIN) 600 MG tablet   Oral   Take 1 tablet (600 mg total) by mouth every 6 (six) hours as needed.   30 tablet   0   . lisinopril (PRINIVIL,ZESTRIL) 20 MG tablet  Oral   Take 40 mg by mouth daily.          Marland Kitchen LORazepam (ATIVAN) 1 MG tablet   Oral   Take 1 tablet (1 mg total) by mouth every 8 (eight) hours as needed for anxiety.   15 tablet   0   . metoprolol (LOPRESSOR) 50 MG tablet   Oral   Take 50 mg by mouth 2 (two) times daily.         . nortriptyline (PAMELOR) 25 MG capsule   Oral   Take 25 mg by mouth at bedtime.         Marland Kitchen oxyCODONE-acetaminophen (ROXICET) 5-325 MG tablet   Oral   Take 1 tablet by mouth every 4 (four) hours as needed for severe pain.   15 tablet   0   . traMADol (ULTRAM) 50 MG tablet   Oral   Take 1 tablet (50 mg total) by mouth every 6 (six) hours as needed.   20 tablet   0     Allergies Review of patient's allergies indicates no known allergies.  History reviewed. No pertinent family history.  Social History Social History  Substance Use Topics  . Smoking status: Former Research scientist (life sciences)  . Smokeless tobacco: None  . Alcohol Use: Yes     Comment: last drink March 11    Review of Systems Constitutional: No fever/chills Cardiovascular: Denies chest pain. Respiratory: Denies shortness of breath. Gastrointestinal: No abdominal pain.  No nausea, no vomiting.  No  diarrhea.   Musculoskeletal: Positive for right knee and ankle pain. Skin: Negative for rash. Neurological: Negative for headaches, focal weakness or numbness.  10-point ROS otherwise negative.  ____________________________________________   PHYSICAL EXAM:  VITAL SIGNS: ED Triage Vitals  Enc Vitals Group     BP 02/03/16 1501 113/82 mmHg     Pulse Rate 02/03/16 1501 74     Resp 02/03/16 1501 18     Temp 02/03/16 1501 98 F (36.7 C)     Temp Source 02/03/16 1501 Oral     SpO2 02/03/16 1501 98 %     Weight 02/03/16 1603 200 lb (90.719 kg)     Height 02/03/16 1603 6' (1.829 m)     Head Cir --      Peak Flow --      Pain Score 02/03/16 1502 9     Pain Loc --      Pain Edu? --      Excl. in [place]? --     Constitutional: Alert and oriented. Well appearing and in no acute distress. Eyes: Conjunctivae are normal. PERRL. EOMI. Head: Atraumatic. Nose: No congestion/rhinnorhea. Neck: No stridor.   Cardiovascular: Normal rate, regular rhythm. Grossly normal heart sounds.  Good peripheral circulation. Respiratory: Normal respiratory effort.  No retractions. Lungs CTAB. Musculoskeletal: Right knee without any erythema or warmth but is extremely tender to touch. There is no effusion present. Patient states that this is the same place he always gets gout. Range of motion is restricted secondary to patient's pain. Slow guarded gait was noted. Examination of the ankle there is no gross deformity. There is moderate tenderness on palpation lateral aspect with minimal soft tissue swelling. Neurologic:  Normal speech and language. No gross focal neurologic deficits are appreciated. No gait instability. Skin:  Skin is warm, dry and intact. No erythema, ecchymosis or abrasions are noted. Psychiatric: Mood and affect are normal. Speech and behavior are normal.  ____________________________________________   LABS (all labs ordered are listed, but  only abnormal results are displayed)  Labs Reviewed  - No data to display  PROCEDURES  Procedure(s) performed: None  Critical Care performed: No  ____________________________________________   INITIAL IMPRESSION / ASSESSMENT AND PLAN / ED COURSE  Pertinent labs & imaging results that were available during my care of the patient were reviewed by me and considered in my medical decision making (see chart for details).  Patient has known history of gout and is asking for refill of his colchicine. [facility]. This morning he took his last pill. Patient was given Decadron 10 mg IM in the ER along with prescription for colchicine 0.6 mg 1 twice a day with food and encouraged to keep this point with [facility]. ____________________________________________   FINAL CLINICAL IMPRESSION(S) / ED DIAGNOSES  Final diagnoses:  Acute gout of right knee, unspecified cause      Johnn Hai, PA-C 02/03/16 1723  Eula Listen, MD 02/03/16 2147

## 2016-02-03 NOTE — Discharge Instructions (Signed)
Gout Gout is when your joints become red, sore, and swell (inflamed). This is caused by the buildup of uric acid crystals in the joints. Uric acid is a chemical that is normally in the blood. If the level of uric acid gets too high in the blood, these crystals form in your joints and tissues. Over time, these crystals can form into masses near the joints and tissues. These masses can destroy bone and cause the bone to look misshapen (deformed). HOME CARE   Do not take aspirin for pain.  Only take medicine as told by your doctor.  Rest the joint as much as you can. When in bed, keep sheets and blankets off painful areas.  Keep the sore joints raised (elevated).  Put warm or cold packs on painful joints. Use of warm or cold packs depends on which works best for you.  Use crutches if the painful joint is in your leg.  Drink enough fluids to keep your pee (urine) clear or pale yellow. Limit alcohol, sugary drinks, and drinks with fructose in them.  Follow your diet instructions. Pay careful attention to how much protein you eat. Include fruits, vegetables, whole grains, and fat-free or low-fat milk products in your daily diet. Talk to your doctor or dietitian about the use of coffee, vitamin C, and cherries. These may help lower uric acid levels.  Keep a healthy body weight. GET HELP RIGHT AWAY IF:   You have watery poop (diarrhea), throw up (vomit), or have any side effects from medicines.  You do not feel better in 24 hours, or you are getting worse.  Your joint becomes suddenly more tender, and you have chills or a fever. MAKE SURE YOU:   Understand these instructions.  Will watch your condition.  Will get help right away if you are not doing well or get worse.   This information is not intended to replace advice given to you by your health care provider. Make sure you discuss any questions you have with your health care provider.   Document Released: 08/11/2008 Document Revised:  11/23/2014 Document Reviewed: 06/15/2012 Elsevier Interactive Patient Education 2016 [place] [facility] appointment [facility]. Begin taking colchicine twice a day with food. Follow-up with your doctor as needed.

## 2016-02-03 NOTE — ED Notes (Signed)
Pt to ed with c/o right knee pain and right ankle pain.  Pt states " i think i have gout"

## 2016-02-03 NOTE — ED Notes (Signed)
States he has a hx of gout  Developed some pain to right knee this am.. States he took a colchine w/o relief   Now thinks it is spreading into ankle

## 2016-02-05 ENCOUNTER — Ambulatory Visit: Payer: Self-pay

## 2016-02-05 DIAGNOSIS — F10220 Alcohol dependence with intoxication, uncomplicated: Secondary | ICD-10-CM | POA: Insufficient documentation

## 2016-02-05 DIAGNOSIS — F1010 Alcohol abuse, uncomplicated: Secondary | ICD-10-CM

## 2016-02-05 DIAGNOSIS — F1020 Alcohol dependence, uncomplicated: Secondary | ICD-10-CM | POA: Insufficient documentation

## 2016-02-06 LAB — COMPREHENSIVE METABOLIC PANEL
ALT: 17 IU/L (ref 0–44)
AST: 13 IU/L (ref 0–40)
Albumin/Globulin Ratio: 1.6 (ref 1.2–2.2)
Albumin: 3.9 g/dL (ref 3.5–5.5)
Alkaline Phosphatase: 64 IU/L (ref 39–117)
BUN/Creatinine Ratio: 15 (ref 9–20)
BUN: 26 mg/dL — ABNORMAL HIGH (ref 6–24)
Bilirubin Total: 0.2 mg/dL (ref 0.0–1.2)
CO2: 25 mmol/L (ref 18–29)
Calcium: 9.3 mg/dL (ref 8.7–10.2)
Chloride: 96 mmol/L (ref 96–106)
Creatinine, Ser: 1.77 mg/dL — ABNORMAL HIGH (ref 0.76–1.27)
GFR calc Af Amer: 49 mL/min/{1.73_m2} — ABNORMAL LOW (ref >59)
GFR calc non Af Amer: 42 mL/min/{1.73_m2} — ABNORMAL LOW (ref >59)
Globulin, Total: 2.5 g/dL (ref 1.5–4.5)
Glucose: 106 mg/dL — ABNORMAL HIGH (ref 65–99)
Potassium: 3.8 mmol/L (ref 3.5–5.2)
Sodium: 139 mmol/L (ref 134–144)
Total Protein: 6.4 g/dL (ref 6.0–8.5)

## 2016-02-06 LAB — CBC WITH DIFFERENTIAL/PLATELET
Basophils Absolute: 0 10*3/uL (ref 0.0–0.2)
Basos: 0 %
EOS (ABSOLUTE): 0 10*3/uL (ref 0.0–0.4)
Eos: 1 %
Hematocrit: 43.5 % (ref 37.5–51.0)
Hemoglobin: 14.9 g/dL (ref 12.6–17.7)
Immature Grans (Abs): 0 10*3/uL (ref 0.0–0.1)
Immature Granulocytes: 0 %
Lymphocytes Absolute: 2.2 10*3/uL (ref 0.7–3.1)
Lymphs: 31 %
MCH: 30 pg (ref 26.6–33.0)
MCHC: 34.3 g/dL (ref 31.5–35.7)
MCV: 88 fL (ref 79–97)
Monocytes Absolute: 1 10*3/uL — ABNORMAL HIGH (ref 0.1–0.9)
Monocytes: 14 %
Neutrophils Absolute: 3.7 10*3/uL (ref 1.4–7.0)
Neutrophils: 54 %
Platelets: 194 10*3/uL (ref 150–379)
RBC: 4.97 x10E6/uL (ref 4.14–5.80)
RDW: 13.9 % (ref 12.3–15.4)
WBC: 6.9 10*3/uL (ref 3.4–10.8)

## 2016-02-12 ENCOUNTER — Encounter: Payer: Self-pay | Admitting: Nurse Practitioner

## 2016-02-13 ENCOUNTER — Emergency Department: Payer: Self-pay

## 2016-02-13 ENCOUNTER — Ambulatory Visit: Payer: Self-pay

## 2016-02-13 ENCOUNTER — Emergency Department
Admission: EM | Admit: 2016-02-13 | Discharge: 2016-02-13 | Disposition: A | Discharge status: Home / Self Care | Payer: Self-pay | Attending: Emergency Medicine | Admitting: Emergency Medicine

## 2016-02-13 DIAGNOSIS — Y998 Other external cause status: Secondary | ICD-10-CM | POA: Insufficient documentation

## 2016-02-13 DIAGNOSIS — I10 Essential (primary) hypertension: Secondary | ICD-10-CM | POA: Insufficient documentation

## 2016-02-13 DIAGNOSIS — X58XXXA Exposure to other specified factors, initial encounter: Secondary | ICD-10-CM | POA: Insufficient documentation

## 2016-02-13 DIAGNOSIS — Y9289 Other specified places as the place of occurrence of the external cause: Secondary | ICD-10-CM | POA: Insufficient documentation

## 2016-02-13 DIAGNOSIS — Z79899 Other long term (current) drug therapy: Secondary | ICD-10-CM | POA: Insufficient documentation

## 2016-02-13 DIAGNOSIS — Z7951 Long term (current) use of inhaled steroids: Secondary | ICD-10-CM | POA: Insufficient documentation

## 2016-02-13 DIAGNOSIS — Y9389 Activity, other specified: Secondary | ICD-10-CM | POA: Insufficient documentation

## 2016-02-13 DIAGNOSIS — Z792 Long term (current) use of antibiotics: Secondary | ICD-10-CM | POA: Insufficient documentation

## 2016-02-13 DIAGNOSIS — Z87891 Personal history of nicotine dependence: Secondary | ICD-10-CM | POA: Insufficient documentation

## 2016-02-13 DIAGNOSIS — S93602A Unspecified sprain of left foot, initial encounter: Secondary | ICD-10-CM | POA: Insufficient documentation

## 2016-02-13 MED ORDER — NAPROXEN 500 MG PO TBEC: 500.0000 mg | DELAYED_RELEASE_TABLET | Freq: Two times a day (BID) | ORAL | Status: DC

## 2016-02-13 NOTE — Discharge Instructions (Signed)
Foot Sprain °A foot sprain is an injury to one of the strong bands of tissue (ligaments) that connect and support the many bones in your feet. The ligament can be stretched too much or it can tear. A tear can be either partial or complete. The severity of the sprain depends on how much of the ligament was damaged or torn. °CAUSES °A foot sprain is usually caused by suddenly twisting or pivoting your foot. °RISK FACTORS °This injury is more likely to occur in people who: °· Play a sport, such as basketball or football. °· Exercise or play a sport without warming up. °· Start a new workout or sport. °· Suddenly increase how long or hard they exercise or play a sport. °SYMPTOMS °Symptoms of this condition start soon after an injury and include: °· Pain, especially in the arch of the foot. °· Bruising. °· Swelling. °· Inability to walk or use the foot to support body weight. °DIAGNOSIS °This condition is diagnosed with a medical history and physical exam. You may also have imaging tests, such as: °· X-rays to make sure there are no broken bones (fractures). °· MRI to see if the ligament has torn. °TREATMENT °Treatment varies depending on the severity of your sprain. Mild sprains can be treated with rest, ice, compression, and elevation (RICE). If your ligament is overstretched or partially torn, treatment usually involves keeping your foot in a fixed position (immobilization) for a period of time. To help you do this, your health care provider will apply a bandage, splint, or walking boot to keep your foot from moving until it heals. You may also be advised to use crutches or a scooter for a few weeks to avoid bearing weight on your foot while it is healing. °If your ligament is fully torn, you may need surgery to reconnect the ligament to the bone. After surgery, a cast or splint will be applied and will need to stay on your foot while it heals. °Your health care provider may also suggest exercises or physical therapy  to strengthen your foot. °HOME CARE INSTRUCTIONS °If You Have a Bandage, Splint, or Walking Boot: °· Wear it as directed by your health care provider. Remove it only as directed by your health care provider. °· Loosen the bandage, splint, or walking boot if your toes become numb and tingle, or if they turn cold and blue. °Bathing °· If your health care provider approves bathing and showering, cover the bandage or splint with a watertight plastic bag to protect it from water. Do not let the bandage or splint get wet. °Managing Pain, Stiffness, and Swelling  °· If directed, apply ice to the injured area: °¨ Put ice in a plastic bag. °¨ Place a towel between your skin and the bag. °¨ Leave the ice on for 20 minutes, 2-3 times per day. °· Move your toes often to avoid stiffness and to lessen swelling. °· Raise (elevate) the injured area above the level of your heart while you are sitting or lying down. °Driving °· Do not drive or operate heavy machinery while taking pain medicine. °· Do not drive while wearing a bandage, splint, or walking boot on a foot that you use for driving. °Activity °· Rest as directed by your health care provider. °· Do not use the injured foot to support your body weight until your health care provider says that you can. Use crutches or other supportive devices as directed by your health care provider. °· Ask your health care   provider what activities are safe for you. Gradually increase how much and how far you walk until your health care provider says it is safe to return to full activity.  Do any exercise or physical therapy as directed by your health care provider. General Instructions  If a splint was applied, do not put pressure on any part of it until it is fully hardened. This may take several hours.  Take medicines only as directed by your health care provider. These include over-the-counter medicines and prescription medicines.  Keep all follow-up visits as directed by your  health care provider. This is important.  When you can walk without pain, wear supportive shoes that have stiff soles. Do not wear flip-flops, and do not walk barefoot. SEEK MEDICAL CARE IF:  Your pain is not controlled with medicine.  Your bruising or swelling gets worse or does not get better with treatment.  Your splint or walking boot is damaged. SEEK IMMEDIATE MEDICAL CARE IF:  Your foot is numb or blue.  Your foot feels colder than normal.   This information is not intended to replace advice given to you by your health care provider. Make sure you discuss any questions you have with your health care provider.   Document Released: 04/24/2002 Document Revised: 03/19/2015 Document Reviewed: 09/05/2014 Elsevier Interactive Patient Education [employer].   Your exam and x-ray are negative for fracture. Wear the ace bandage as needed for pain and support. Take your home meds or the prescription Naproxen as needed for pain relief. Apply ice to reduce pain and swelling. Follow-up with Dr. Venia Minks as needed.

## 2016-02-13 NOTE — ED Provider Notes (Signed)
[facility] Emergency Department Provider Note ____________________________________________  Time seen: 1337  I have reviewed the triage vital signs and the nursing notes.  HISTORY  Chief Complaint  Foot Pain  HPI Miguel [name] is a 55 y.o. male presents to the ED for evaluation of pain to the lateral aspect of the foot that is not responding to his daily colchicine. The patient was seen here last week and treated for an acute gout flare to the knee. He describes that since that time he's been taking his colchicine daily but denies any benefit to this foot. He reports he may have rolled her foot while taking out his trash dumpster about 3 days prior. He denies any other injury at this time.He rates his discomfort overall at a 9/10 in triage.  Past Medical History  Diagnosis Date  . Hypertension   . Gout   . Renal colic   . Asthma   . GERD (gastroesophageal reflux disease)     Patient Active Problem List   Diagnosis Date Noted  . Alcohol abuse 02/05/2016  . Hypertension 12/05/2015  . Tachycardia 12/05/2015  . Gout 11/13/2015  . Chronic back pain 05/02/2015    Past Surgical History  Procedure Laterality Date  . Cholecystectomy  2012    Current Outpatient Rx  Name  Route  Sig  Dispense  Refill  . albuterol (VENTOLIN HFA) 108 (90 Base) MCG/ACT inhaler   Inhalation   Inhale 2 puffs into the lungs every 4 (four) hours as needed for wheezing or shortness of breath.         . allopurinol (ZYLOPRIM) 100 MG tablet   Oral   Take 100 mg by mouth daily.         Marland Kitchen amoxicillin (AMOXIL) 500 MG capsule   Oral   Take 1 capsule (500 mg total) by mouth 3 (three) times daily.   30 capsule   0   . chlordiazePOXIDE (LIBRIUM) 10 MG capsule      Day 1-2: Take 1 tablet PO Q6H Day 3-4: Take 1 tablet PO Q8H Day 5-6: Take 1 tablet PO Q12H Day 7-8: Take 1 tablet Daily.   20 capsule   0   . colchicine 0.6 MG tablet      1 tablet once or twice a day with  food   40 tablet   2   . Dexlansoprazole 30 MG capsule   Oral   Take 30 mg by mouth daily.         Marland Kitchen esomeprazole (NEXIUM) 40 MG capsule   Oral   Take 40 mg by mouth daily at 12 noon.         . fluticasone (FLONASE) 50 MCG/ACT nasal spray   Each Nare   Place 1 spray into both nostrils daily.   16 g   0   . Fluticasone-Salmeterol (ADVAIR) 100-50 MCG/DOSE AEPB   Inhalation   Inhale 1 puff into the lungs 2 (two) times daily.         Marland Kitchen gabapentin (NEURONTIN) 300 MG capsule   Oral   Take 300 mg by mouth at bedtime.         . hydrochlorothiazide (HYDRODIURIL) 25 MG tablet   Oral   Take 25 mg by mouth daily.         Marland Kitchen ibuprofen (ADVIL,MOTRIN) 600 MG tablet   Oral   Take 1 tablet (600 mg total) by mouth every 6 (six) hours as needed.   30 tablet  0   . lisinopril (PRINIVIL,ZESTRIL) 20 MG tablet   Oral   Take 40 mg by mouth daily.          Marland Kitchen LORazepam (ATIVAN) 1 MG tablet   Oral   Take 1 tablet (1 mg total) by mouth every 8 (eight) hours as needed for anxiety.   15 tablet   0   . metoprolol (LOPRESSOR) 50 MG tablet   Oral   Take 50 mg by mouth 2 (two) times daily.         . naproxen (EC NAPROSYN) 500 MG EC tablet   Oral   Take 1 tablet (500 mg total) by mouth 2 (two) times daily with a meal.   30 tablet   0   . nortriptyline (PAMELOR) 25 MG capsule   Oral   Take 25 mg by mouth at bedtime.         Marland Kitchen oxyCODONE-acetaminophen (ROXICET) 5-325 MG tablet   Oral   Take 1 tablet by mouth every 4 (four) hours as needed for severe pain.   15 tablet   0   . traMADol (ULTRAM) 50 MG tablet   Oral   Take 1 tablet (50 mg total) by mouth every 6 (six) hours as needed.   20 tablet   0    Allergies Review of patient's allergies indicates no known allergies.  No family history on file.  Social History Social History  Substance Use Topics  . Smoking status: Former Research scientist (life sciences)  . Smokeless tobacco: None  . Alcohol Use: Yes     Comment: last drink March  11   Review of Systems  Constitutional: Negative for fever. Cardiovascular: Negative for chest pain. Respiratory: Negative for shortness of breath. Musculoskeletal: Negative for back pain. Left foot pain as above.  Skin: Negative for rash. Neurological: Negative for headaches, focal weakness or numbness. ____________________________________________  PHYSICAL EXAM:  VITAL SIGNS: ED Triage Vitals  Enc Vitals Group     BP 02/13/16 1243 117/77 mmHg     Pulse Rate 02/13/16 1243 63     Resp 02/13/16 1243 16     Temp 02/13/16 1243 97.7 F (36.5 C)     Temp Source 02/13/16 1243 Oral     SpO2 02/13/16 1243 99 %     Weight 02/13/16 1243 200 lb (90.719 kg)     Height 02/13/16 1243 6' (1.829 m)     Head Cir --      Peak Flow --      Pain Score 02/13/16 1243 9     Pain Loc --      Pain Edu? --      Excl. in [place]? --    Constitutional: Alert and oriented. Well appearing and in no distress. Head: Normocephalic and atraumatic. Hematological/Lymphatic/Immunological: No cervical lymphadenopathy. Cardiovascular: Normal rate, regular rhythm. Normal distal pulses and normal capillary refill. Respiratory: Normal respiratory effort. No wheezes/rales/rhonchi. Gastrointestinal: Soft and nontender. No distention. Musculoskeletal: Left foot without obvious deformity, edema, or his location. Patient with tenderness to palpation to the lateral left foot at the fifth MT. Normal ankle range of motion on exam. Her Achilles and this is appreciated. Nontender with normal range of motion in all extremities.  Neurologic: Gross sensation is intact. Cranial nerves II through XII grossly intact. Normal gait without ataxia. Normal speech and language. No gross focal neurologic deficits are appreciated. Skin:  Skin is warm, dry and intact. No rash noted. Psychiatric: Mood and affect are normal. Patient exhibits appropriate insight and judgment.  ____________________________________________   RADIOLOGY Left  Foot IMPRESSION: No acute fracture or subluxation. Small posterior spur of calcaneus. ____________________________________________  INITIAL IMPRESSION / ASSESSMENT AND PLAN / ED COURSE  Patient with a foot sprain without radiologic evidence of fracture or dislocation. He'll be given a prescription for Naprosyn for pain and inflammation. Follow-up with primary care provider for ongoing symptom management. He is given instruction on the appropriate dosing of colchicine for acute gout attacks. ____________________________________________  FINAL CLINICAL IMPRESSION(S) / ED DIAGNOSES  Final diagnoses:  Foot sprain, left, initial encounter      Melvenia Needles, PA-C 02/13/16 1548  Lisa Roca, MD 02/14/16 1140

## 2016-02-13 NOTE — ED Notes (Signed)
Pt has pain in left 5th toe and foot.  States i have gout and cholchicine is not helping with pain.  Denies injury to foot   No swelling or redness noted.  Pt alert.

## 2016-02-13 NOTE — ED Notes (Signed)
Pt c/o left foot pain for the past 3 days, thought it was a gout flare and took some meds he had a home but not getting any better

## 2016-02-14 IMAGING — CT CT RENAL STONE PROTOCOL
1 of 2 series · 15 of 32 positions shown, 19 images · non-contrast
Comparison: 03/25/2015

CLINICAL DATA: Bilateral flank pain and lower abdominal pain, onset
yesterday.

EXAM:
CT ABDOMEN AND PELVIS WITHOUT CONTRAST
TECHNIQUE: Multidetector CT imaging of the abdomen and pelvis was performed
following the standard protocol without IV contrast.

[Series 2: stone standard full · axial · 0.80mm/px · z∈[-496,-56]mm · 15 of 96 slices shown, 19 images]
[im 4/96  soft-tissue]
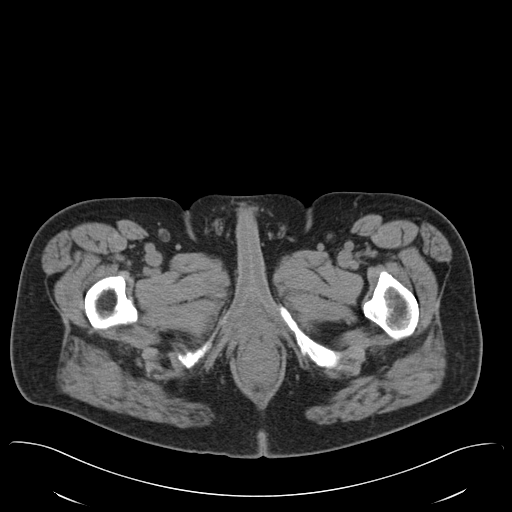
[im 4/96  bone]
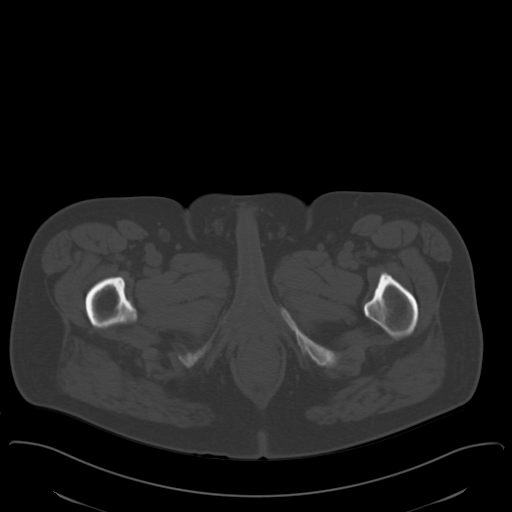
[im 11/96  soft-tissue]
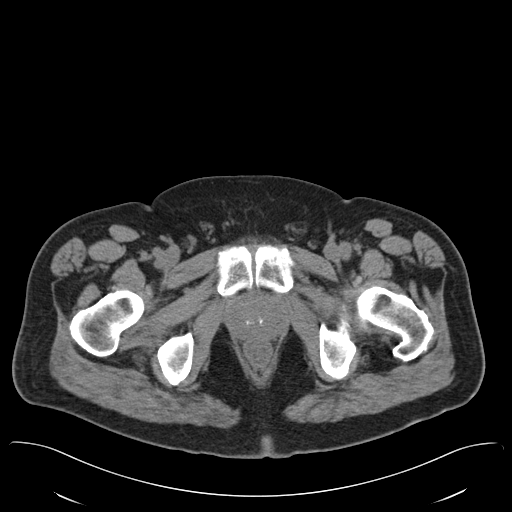
[im 19/96  soft-tissue]
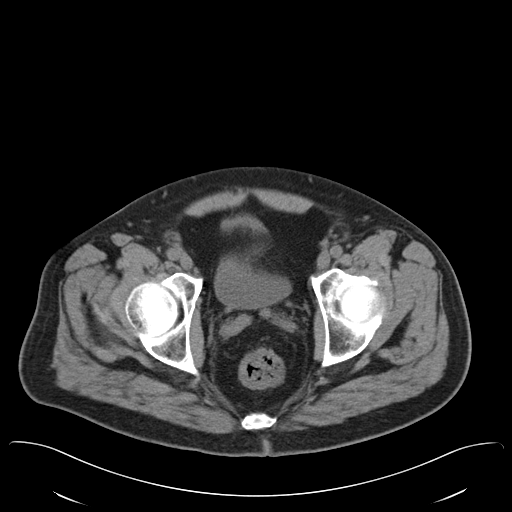
[im 26/96  soft-tissue]
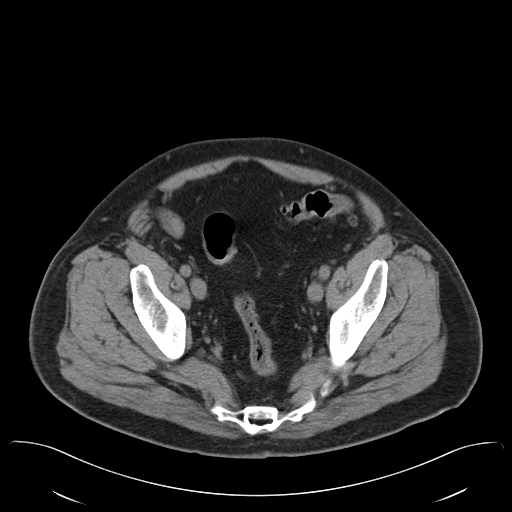
[im 33/96  soft-tissue]
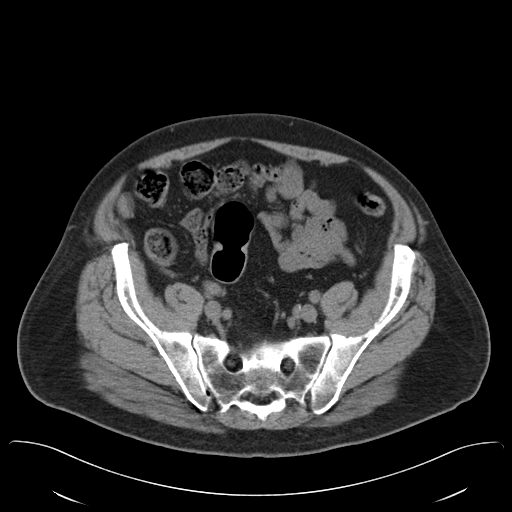
[im 41/96  soft-tissue]
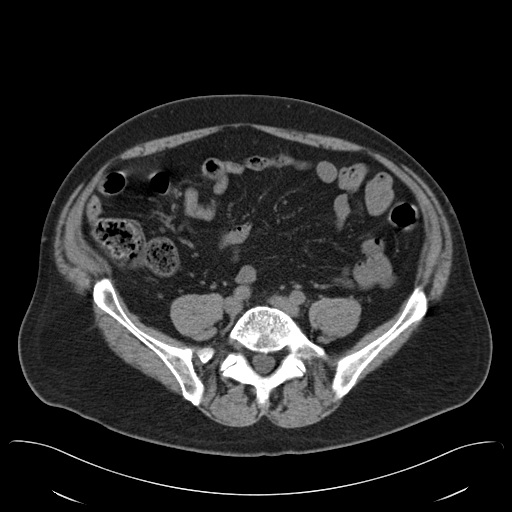
[im 48/96  soft-tissue]
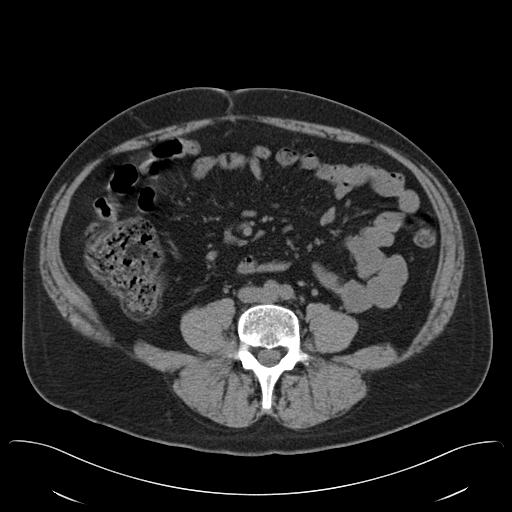
[im 55/96  soft-tissue]
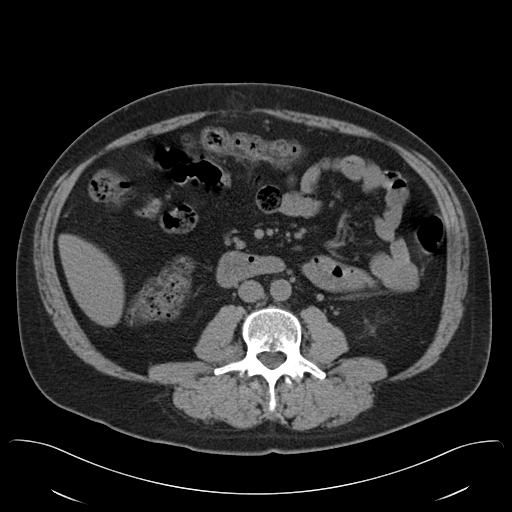
[im 63/96  soft-tissue]
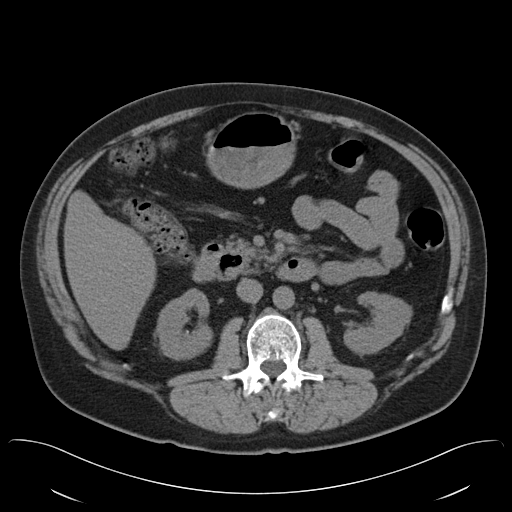
[im 63/96  bone]
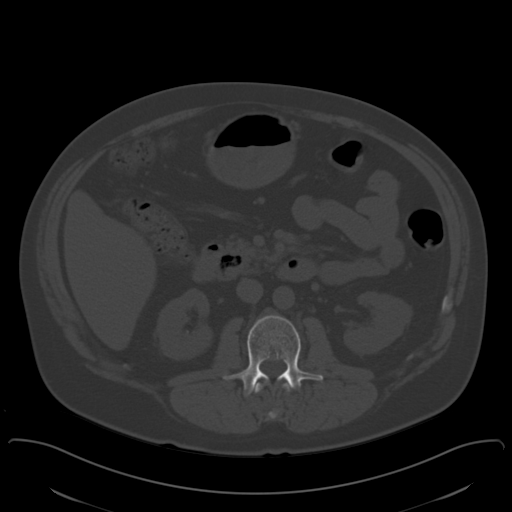
[im 70/96  soft-tissue]
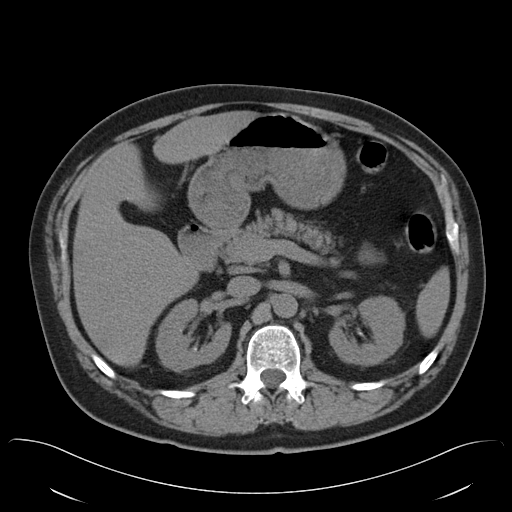
[im 77/96  soft-tissue]
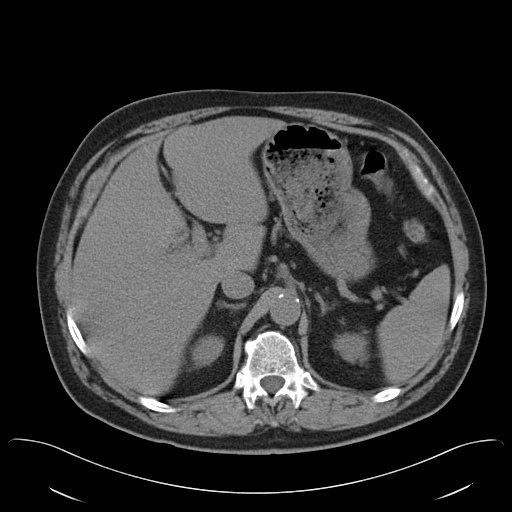
[im 81/96  lung]
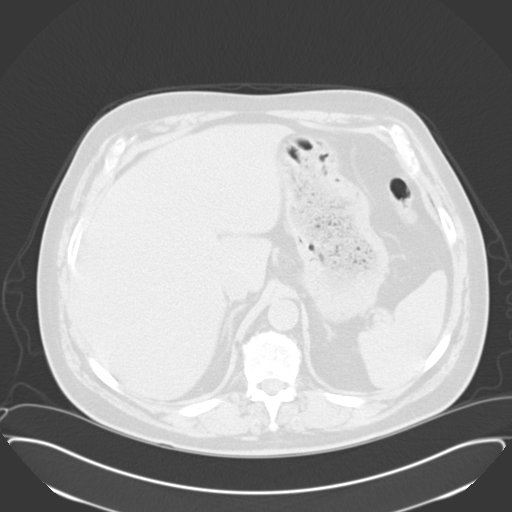
[im 85/96  soft-tissue]
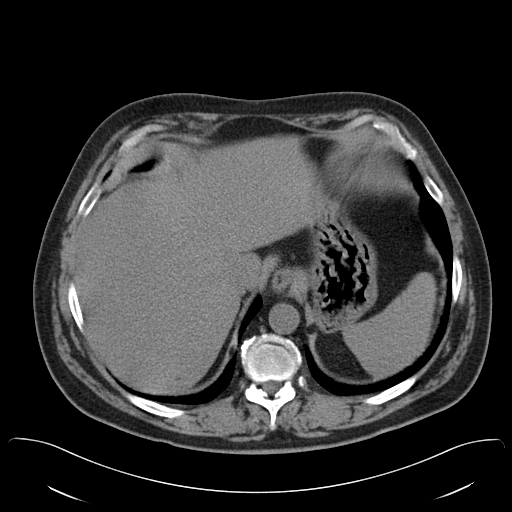
[im 85/96  lung]
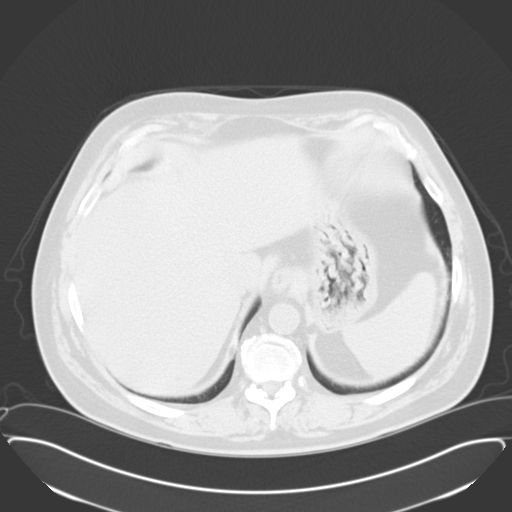
[im 88/96  lung]
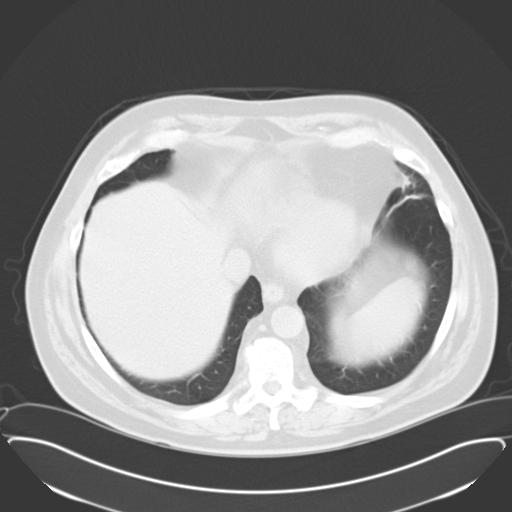
[im 92/96  soft-tissue]
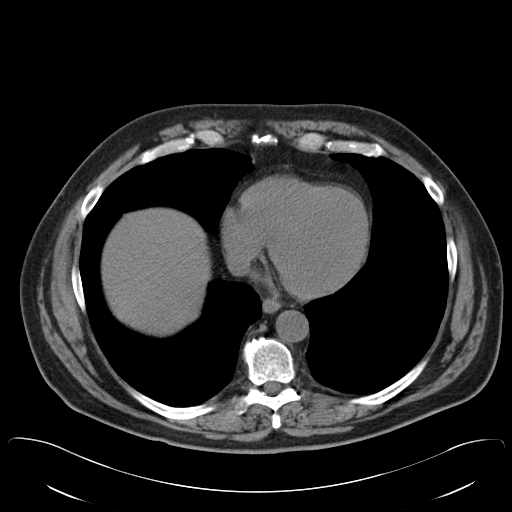
[im 92/96  lung]
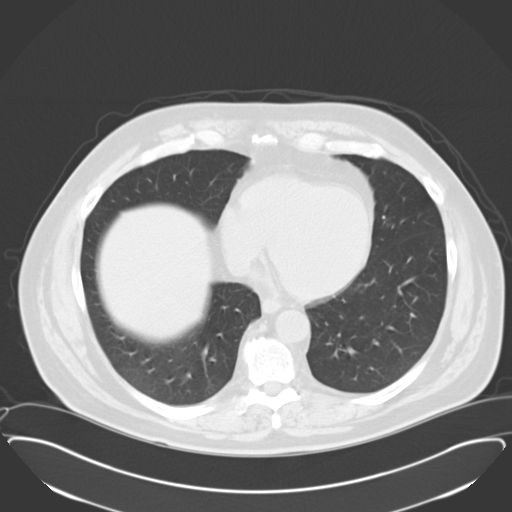

[15 of 32 positions shown; findings below may reference images not displayed]

FINDINGS: There is bilateral nephrolithiasis. The largest right collecting
system calculus measures 3 x 5 mm. The largest left collecting
system calculus measures 6 x 9 mm. There are no ureteral calculi.
There is no hydronephrosis or ureteral dilatation. There is no
periureteral stranding.

There are unremarkable unenhanced appearances of the liver, spleen,
pancreas, and adrenals.

There are normal appearances of the stomach, small bowel and colon
with the exception of uncomplicated colonic diverticulosis. The
appendix is normal.

There is no significant abnormality in the lower chest. There is
mild curvilinear atelectasis in the lingular base.

There is no significant musculoskeletal lesion.
IMPRESSION: 1. Bilateral nephrolithiasis.
2. Diverticulosis.
3. No acute findings are evident in the abdomen or pelvis.

## 2016-03-12 IMAGING — CR DG ANKLE COMPLETE 3+V*R*
1 series · 3 of 3 positions shown · non-contrast
Comparison: Right foot radiographs 02/15/2015

CLINICAL DATA: Ankle pain after fall, pain over the lateral
malleolus.

EXAM:
RIGHT ANKLE - COMPLETE 3+ VIEW

[Series 1: ap · 0.17mm/px · 3 of 3 slices shown]
[im 1/3]
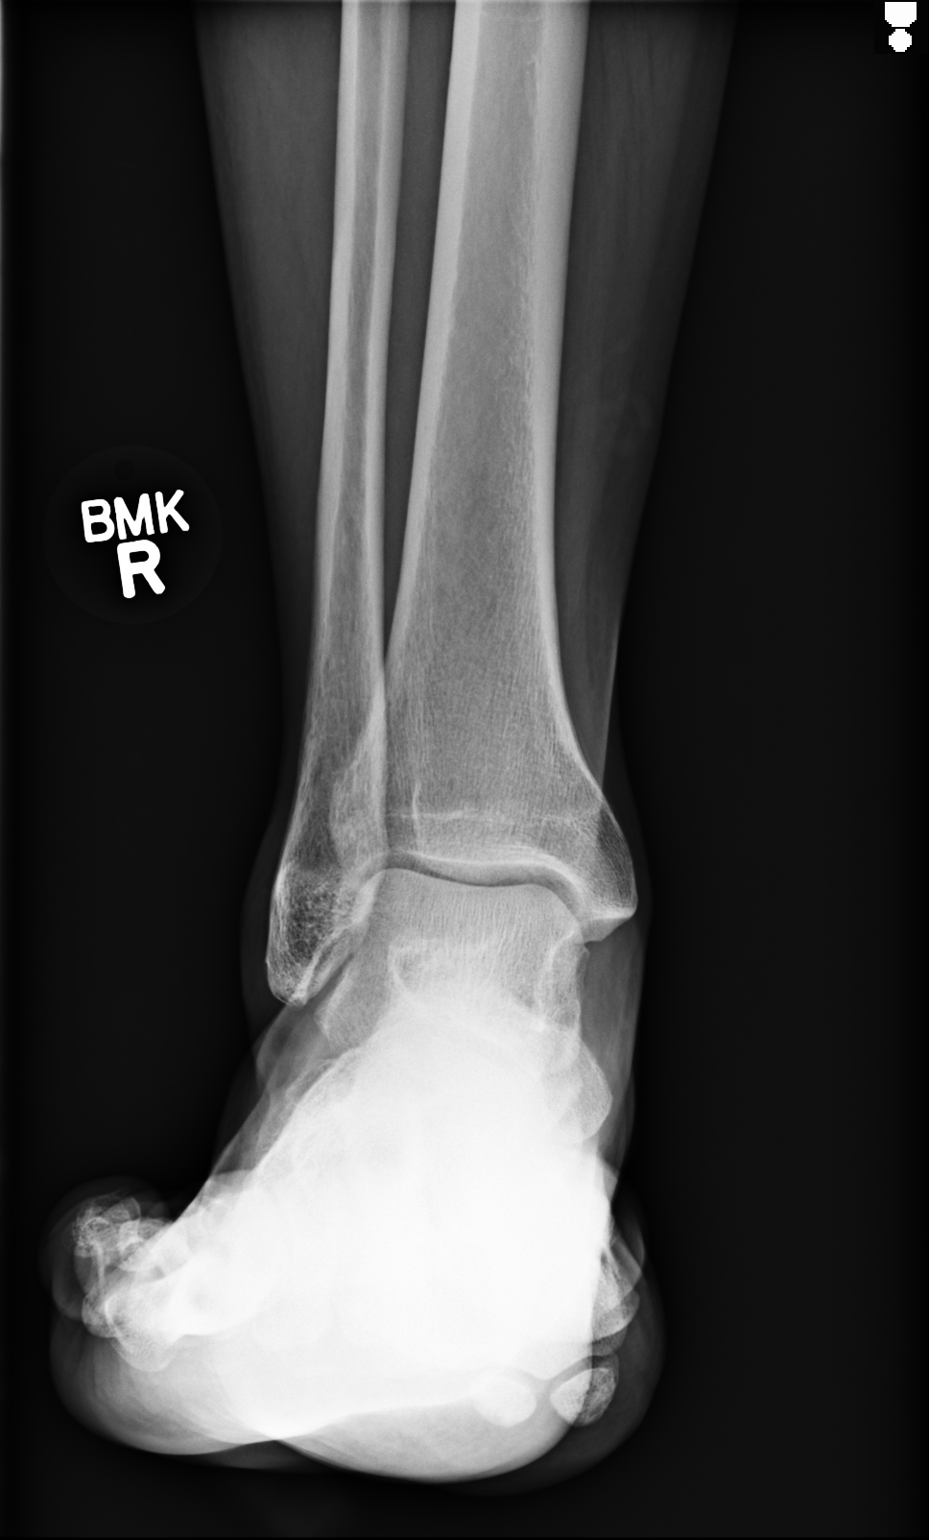
[im 2/3]
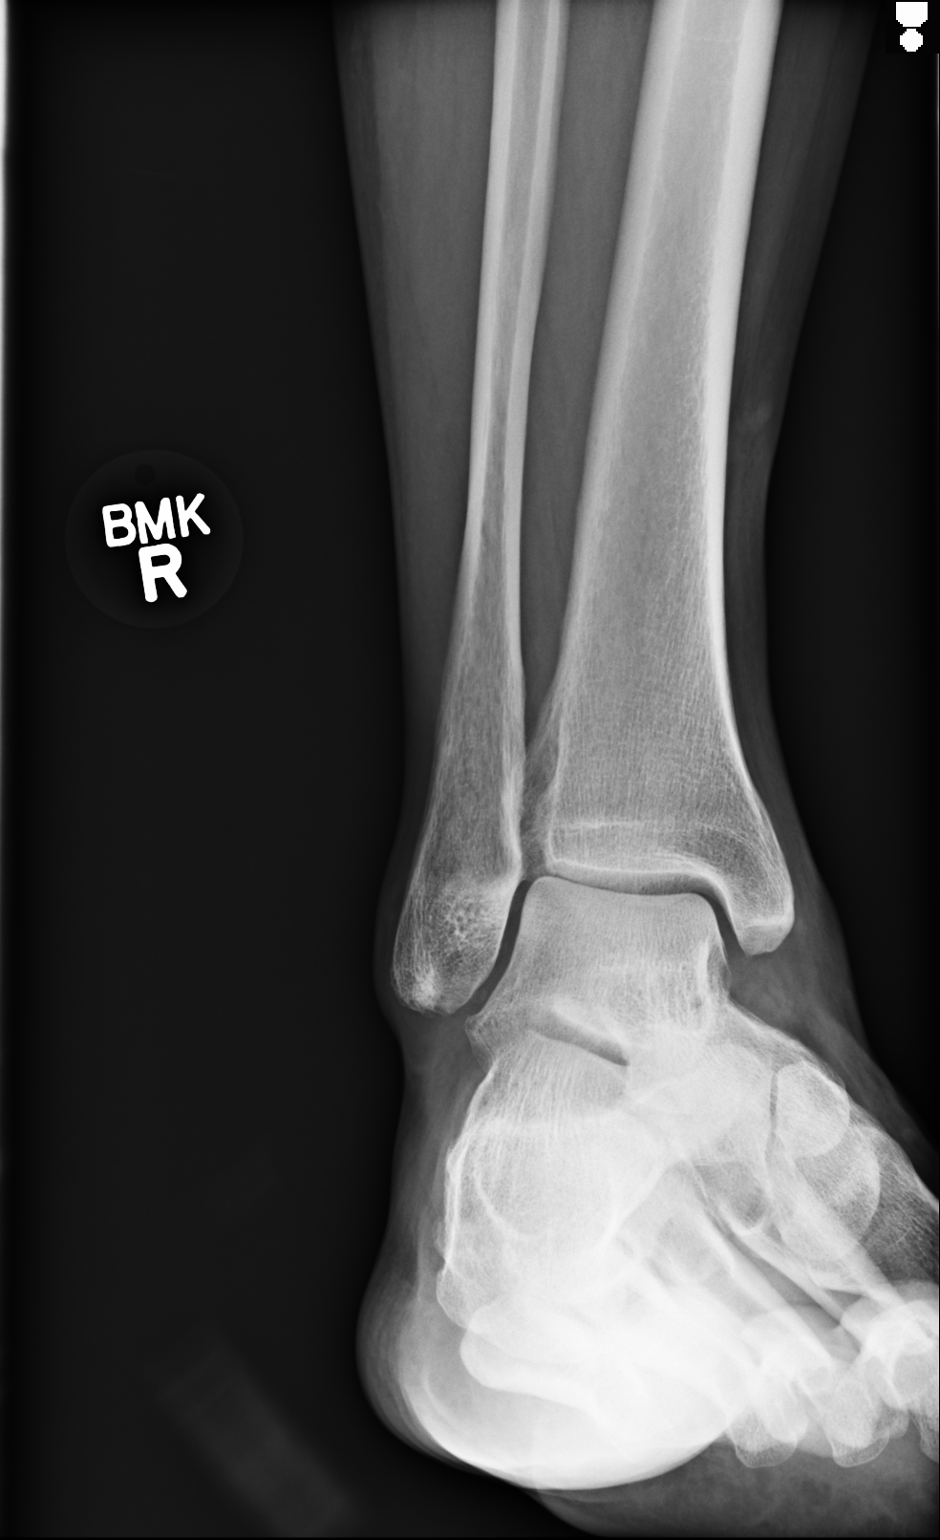
[im 3/3]
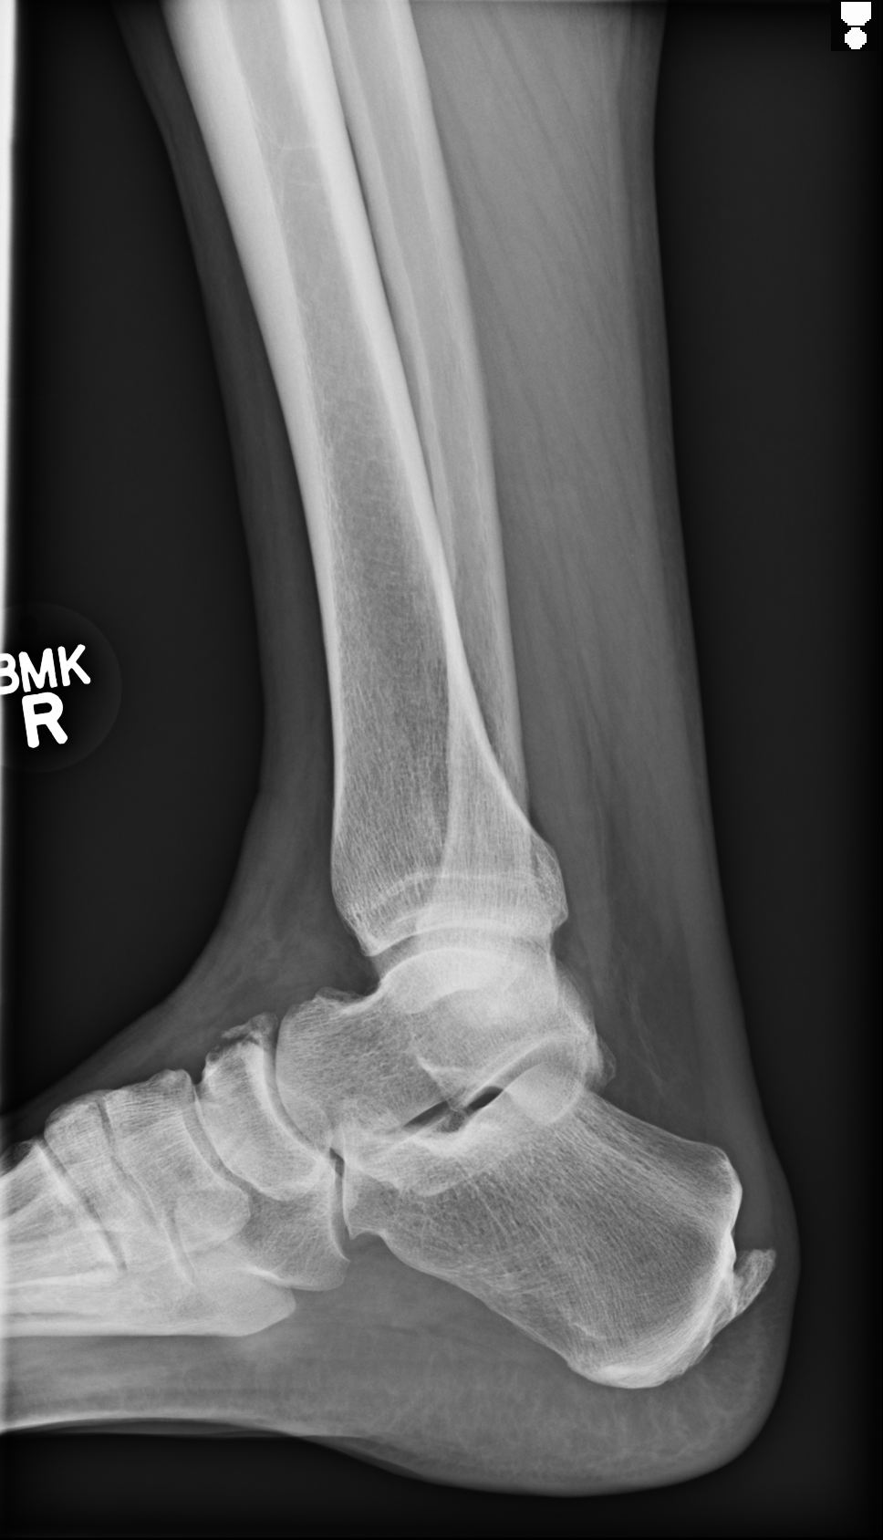

[3 of 3 positions shown; findings below may reference images not displayed]

FINDINGS: There is an avulsion fracture from dorsal navicular. Adjacent soft
tissue edema. No additional fracture of the ankle joint. The ankle
mortise is preserved. Achilles tendon enthesophyte again seen.
IMPRESSION: Avulsion fracture from the dorsal navicular.

## 2016-03-13 IMAGING — CR DG TIBIA/FIBULA 2V*R*
1 series · 4 of 4 positions shown · non-contrast
Comparison: None.

CLINICAL DATA: Pain after recent fall

EXAM:
RIGHT TIBIA AND FIBULA - 2 VIEW

[Series 1: ap · 0.17mm/px · 4 of 4 slices shown]
[im 1/4]
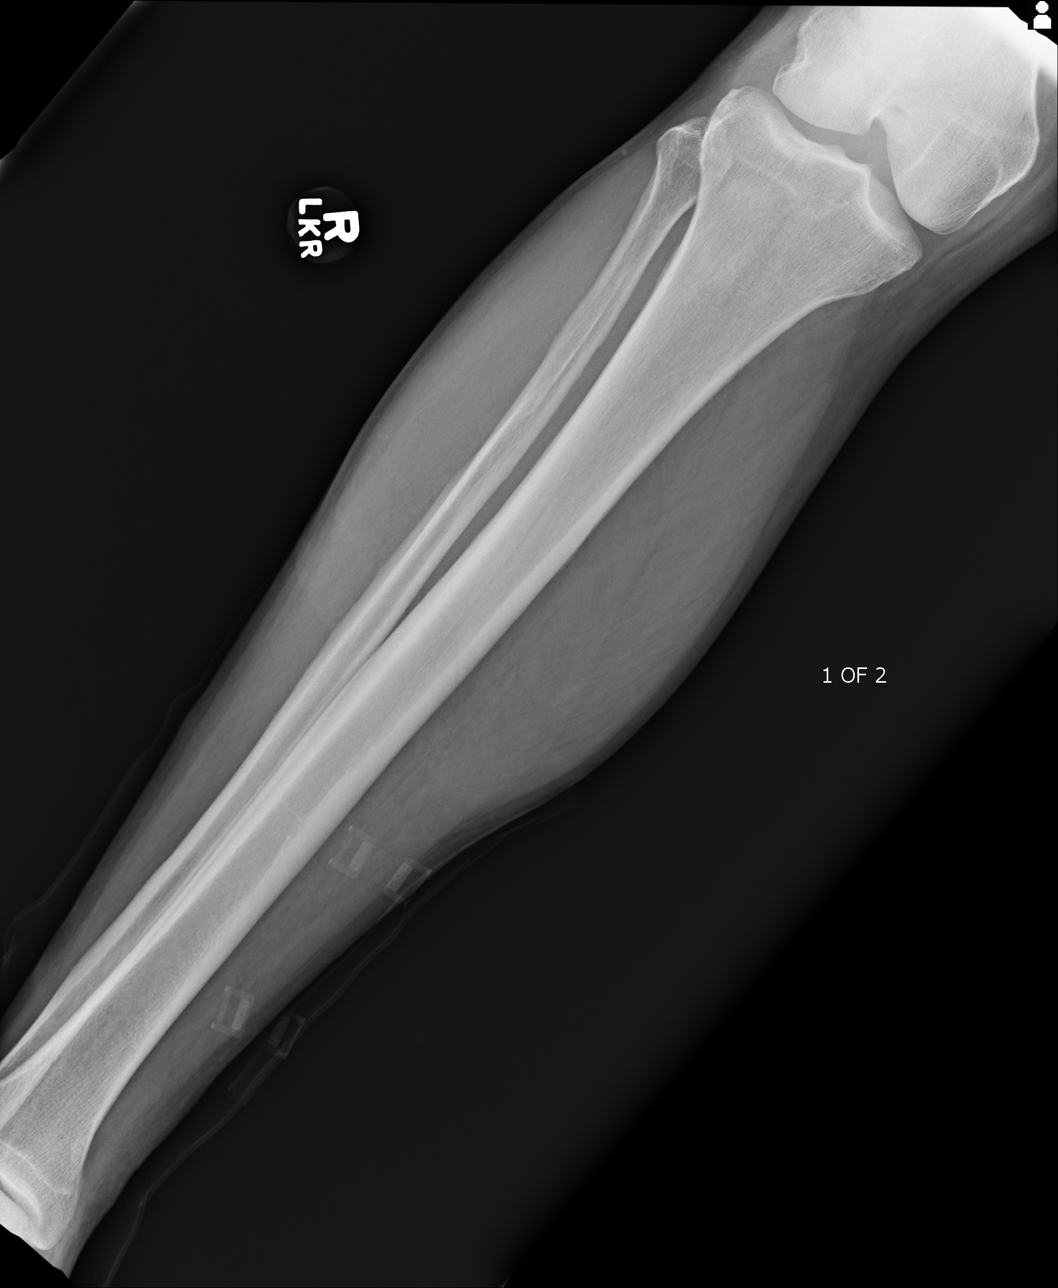
[im 2/4]
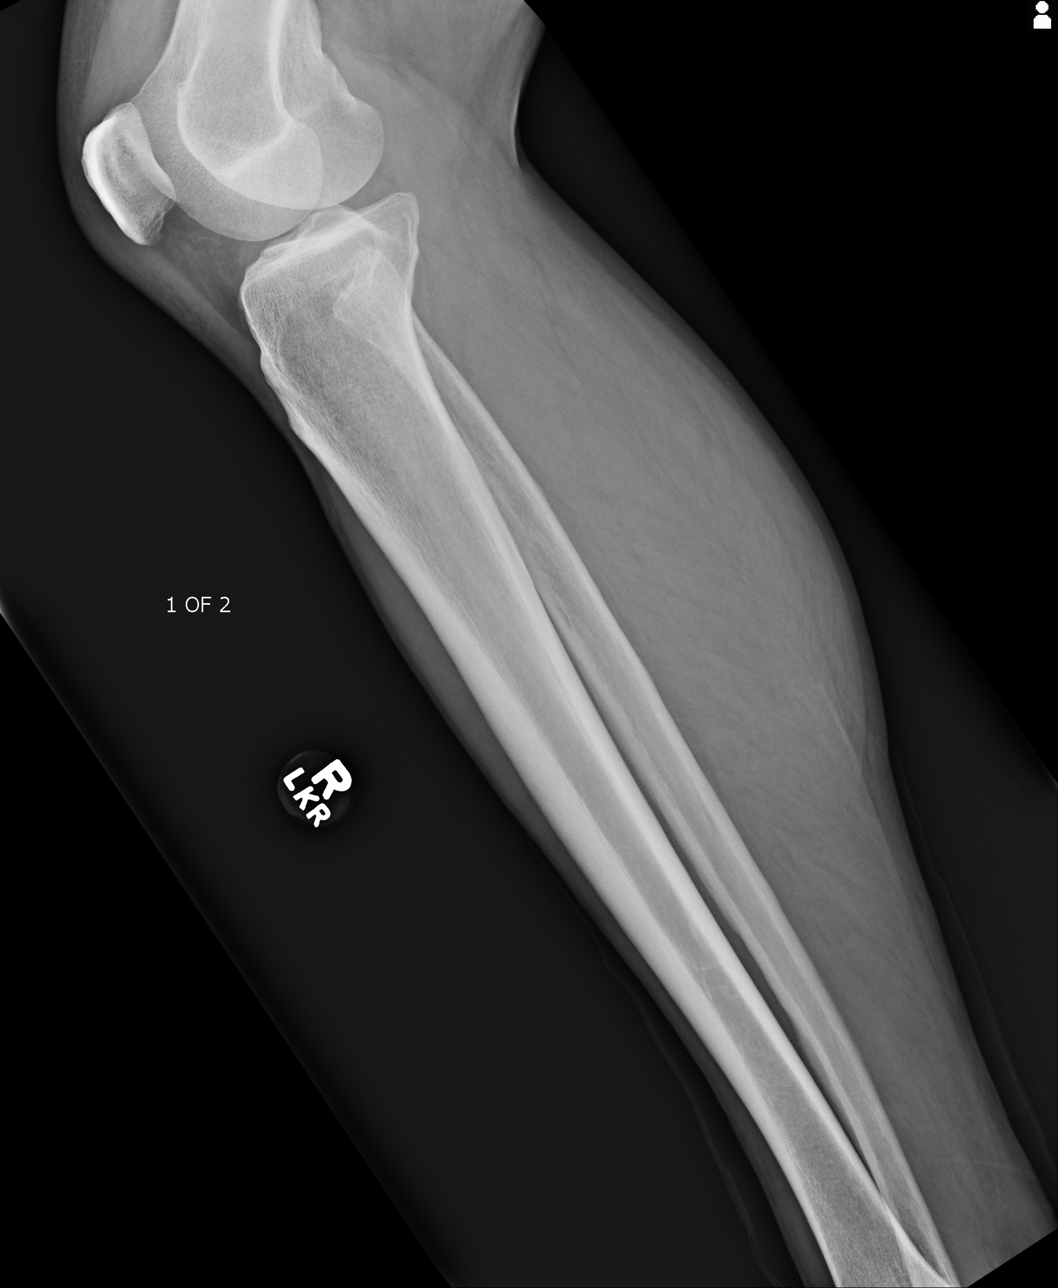
[im 3/4]
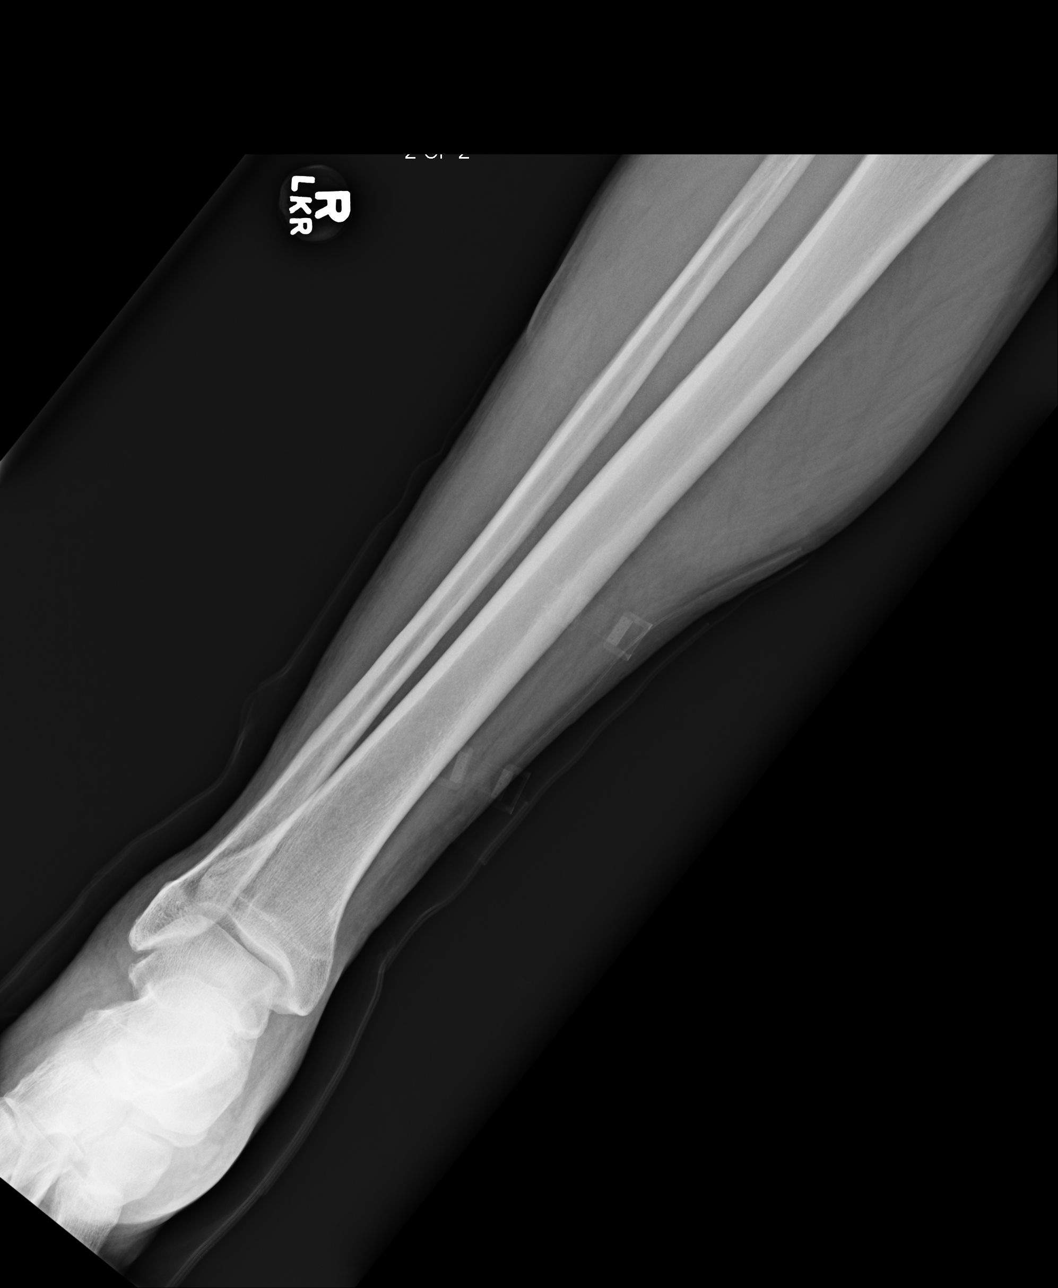
[im 4/4]
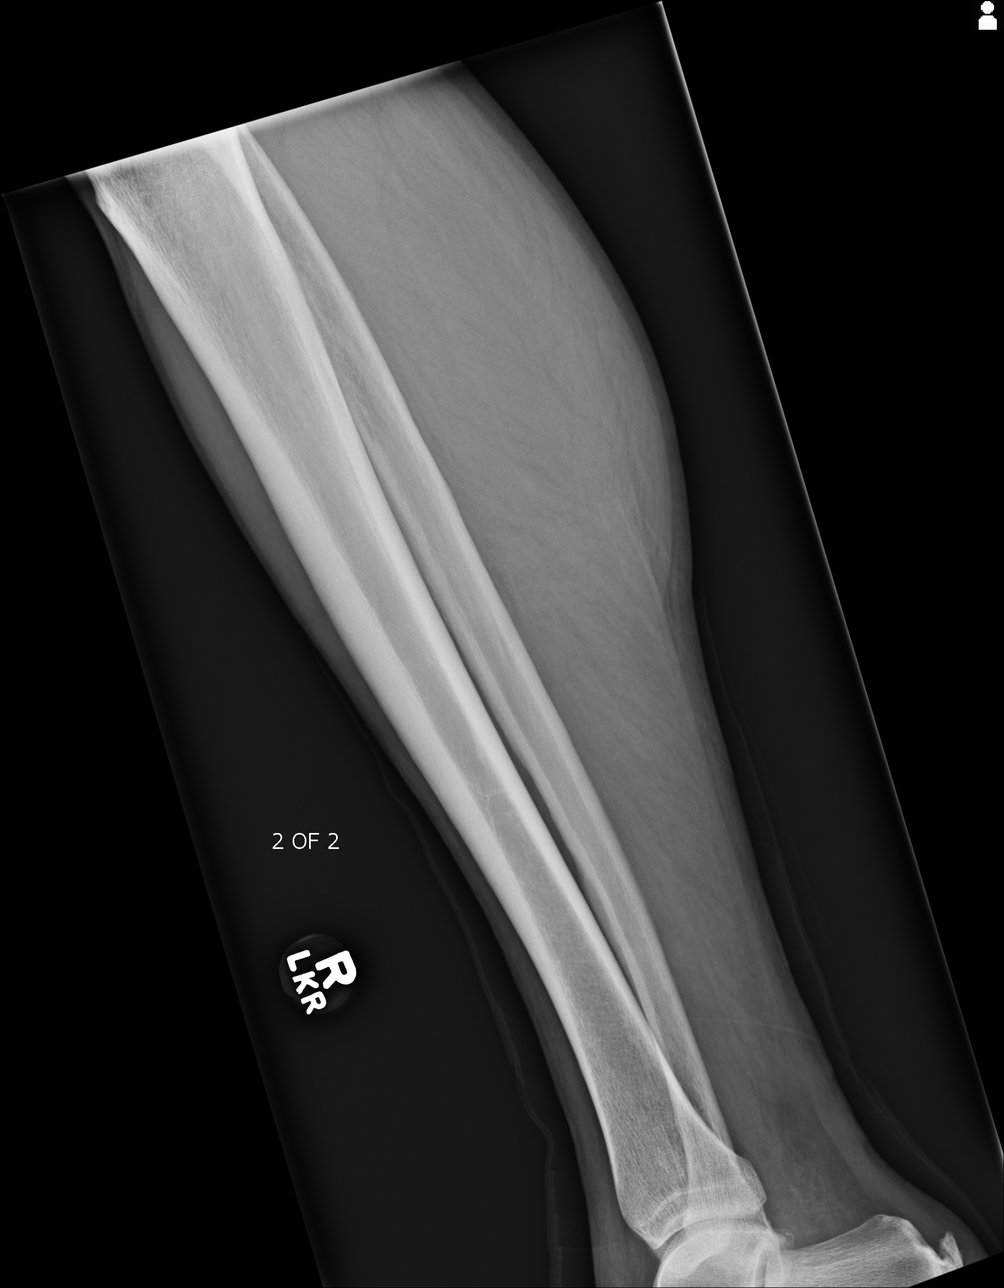

[4 of 4 positions shown; findings below may reference images not displayed]

FINDINGS: Frontal and lateral views obtained. No acute fracture or
dislocation. Joint spaces appear intact. There is a spur arising
from the posterior calcaneus.
IMPRESSION: No acute fracture or dislocation.  Posterior calcaneal spur.

## 2016-03-31 ENCOUNTER — Emergency Department
Admission: EM | Admit: 2016-03-31 | Discharge: 2016-03-31 | Disposition: A | Discharge status: Home / Self Care | Payer: Self-pay | Attending: Student | Admitting: Student

## 2016-03-31 ENCOUNTER — Encounter: Payer: Self-pay | Admitting: Emergency Medicine

## 2016-03-31 DIAGNOSIS — Z87891 Personal history of nicotine dependence: Secondary | ICD-10-CM | POA: Insufficient documentation

## 2016-03-31 DIAGNOSIS — F419 Anxiety disorder, unspecified: Secondary | ICD-10-CM | POA: Insufficient documentation

## 2016-03-31 DIAGNOSIS — I10 Essential (primary) hypertension: Secondary | ICD-10-CM | POA: Insufficient documentation

## 2016-03-31 DIAGNOSIS — Z791 Long term (current) use of non-steroidal anti-inflammatories (NSAID): Secondary | ICD-10-CM | POA: Insufficient documentation

## 2016-03-31 DIAGNOSIS — Z79899 Other long term (current) drug therapy: Secondary | ICD-10-CM | POA: Insufficient documentation

## 2016-03-31 DIAGNOSIS — J45909 Unspecified asthma, uncomplicated: Secondary | ICD-10-CM | POA: Insufficient documentation

## 2016-03-31 DIAGNOSIS — Z792 Long term (current) use of antibiotics: Secondary | ICD-10-CM | POA: Insufficient documentation

## 2016-03-31 NOTE — ED Provider Notes (Addendum)
[facility] Emergency Department Provider Note   ____________________________________________  Time seen: Approximately 6:11 PM  I have reviewed the triage vital signs and the nursing notes.   HISTORY  Chief Complaint Withdrawal    HPI Miguel [name] is a 55 y.o. male with past medical history of hypertension, asthma, history of alcohol abuse, who presents for evaluation of anxiety ongoing for the past 4 days, gradual onset, constant since onset, currently severe. The patient forced that he stopped drinking in March after he received Librium from this emergency department. He reports that between mid March and 03/26/2016, he had no alcohol to drink. He had some friends come into town on 03/27/2016 and he reports that he drank heavily on that day however he has not had any alcohol since that date. He feels quite anxious. He has had problems with anxiety in the past and previous and was prescribed Ativan. He has been taking his Librium for anxiety because he thought that that's why he was prescribed Librium. No suicidal ideation, homicidal ideation or audiovisual hallucinations. No chest pain or difficulty breathing, no vomiting, diarrhea, fevers or chills. He feels "shaky" today. His primary care doctor was not able to see him today.   Past Medical History  Diagnosis Date  . Hypertension   . Gout   . Renal colic   . Asthma   . GERD (gastroesophageal reflux disease)     Patient Active Problem List   Diagnosis Date Noted  . Alcohol abuse 02/05/2016  . Hypertension 12/05/2015  . Tachycardia 12/05/2015  . Gout 11/13/2015  . Chronic back pain 05/02/2015    Past Surgical History  Procedure Laterality Date  . Cholecystectomy  2012    Current Outpatient Rx  Name  Route  Sig  Dispense  Refill  . albuterol (VENTOLIN HFA) 108 (90 Base) MCG/ACT inhaler   Inhalation   Inhale 2 puffs into the lungs every 4 (four) hours as needed for wheezing or shortness of  breath.         . allopurinol (ZYLOPRIM) 100 MG tablet   Oral   Take 100 mg by mouth daily.         Marland Kitchen amoxicillin (AMOXIL) 500 MG capsule   Oral   Take 1 capsule (500 mg total) by mouth 3 (three) times daily.   30 capsule   0   . chlordiazePOXIDE (LIBRIUM) 10 MG capsule      Day 1-2: Take 1 tablet PO Q6H Day 3-4: Take 1 tablet PO Q8H Day 5-6: Take 1 tablet PO Q12H Day 7-8: Take 1 tablet Daily.   20 capsule   0   . EXPIRED: colchicine 0.6 MG tablet      1 tablet once or twice a day with food   40 tablet   2   . Dexlansoprazole 30 MG capsule   Oral   Take 30 mg by mouth daily.         Marland Kitchen esomeprazole (NEXIUM) 40 MG capsule   Oral   Take 40 mg by mouth daily at 12 noon.         . fluticasone (FLONASE) 50 MCG/ACT nasal spray   Each Nare   Place 1 spray into both nostrils daily.   16 g   0   . Fluticasone-Salmeterol (ADVAIR) 100-50 MCG/DOSE AEPB   Inhalation   Inhale 1 puff into the lungs 2 (two) times daily.         Marland Kitchen gabapentin (NEURONTIN) 300 MG capsule  Oral   Take 300 mg by mouth at bedtime.         . hydrochlorothiazide (HYDRODIURIL) 25 MG tablet   Oral   Take 25 mg by mouth daily.         Marland Kitchen ibuprofen (ADVIL,MOTRIN) 600 MG tablet   Oral   Take 1 tablet (600 mg total) by mouth every 6 (six) hours as needed.   30 tablet   0   . lisinopril (PRINIVIL,ZESTRIL) 20 MG tablet   Oral   Take 40 mg by mouth daily.          Marland Kitchen LORazepam (ATIVAN) 1 MG tablet   Oral   Take 1 tablet (1 mg total) by mouth every 8 (eight) hours as needed for anxiety.   15 tablet   0   . metoprolol (LOPRESSOR) 50 MG tablet   Oral   Take 50 mg by mouth 2 (two) times daily.         . naproxen (EC NAPROSYN) 500 MG EC tablet   Oral   Take 1 tablet (500 mg total) by mouth 2 (two) times daily with a meal.   30 tablet   0   . nortriptyline (PAMELOR) 25 MG capsule   Oral   Take 25 mg by mouth at bedtime.         Marland Kitchen oxyCODONE-acetaminophen (ROXICET) 5-325 MG  tablet   Oral   Take 1 tablet by mouth every 4 (four) hours as needed for severe pain.   15 tablet   0   . traMADol (ULTRAM) 50 MG tablet   Oral   Take 1 tablet (50 mg total) by mouth every 6 (six) hours as needed.   20 tablet   0     Allergies Review of patient's allergies indicates no known allergies.  No family history on file.  Social History Social History  Substance Use Topics  . Smoking status: Former Research scientist (life sciences)  . Smokeless tobacco: None  . Alcohol Use: Yes     Comment: last drink March 11    Review of Systems Constitutional: No fever/chills Eyes: No visual changes. ENT: No sore throat. Cardiovascular: Denies chest pain. Respiratory: Denies shortness of breath. Gastrointestinal: No abdominal pain.  No nausea, no vomiting.  No diarrhea.  No constipation. Genitourinary: Negative for dysuria. Musculoskeletal: Negative for back pain. Skin: Negative for rash. Neurological: Negative for headaches, focal weakness or numbness.  10-point ROS otherwise negative.  ____________________________________________   PHYSICAL EXAM:  VITAL SIGNS: ED Triage Vitals  Enc Vitals Group     BP 03/31/16 1728 92/56 mmHg     Pulse Rate 03/31/16 1728 85     Resp 03/31/16 1728 15     Temp 03/31/16 1728 98.3 F (36.8 C)     Temp Source 03/31/16 1728 Oral     SpO2 03/31/16 1728 100 %     Weight 03/31/16 1728 200 lb (90.719 kg)     Height 03/31/16 1728 6\' 1"  (1.854 m)     Head Cir --      Peak Flow --      Pain Score 03/31/16 1730 1     Pain Loc --      Pain Edu? --      Excl. in [place]? --     Constitutional: Alert and oriented. Mildly anxious but nontoxic appearing and in no acute distress. Eyes: Conjunctivae are normal. PERRL. EOMI. Head: Atraumatic. Nose: No congestion/rhinnorhea. Mouth/Throat: Mucous membranes are moist.  Oropharynx non-erythematous. Neck: No stridor.  Supple without meningismus. Cardiovascular: Normal rate, regular rhythm. Grossly normal heart sounds.   Good peripheral circulation. Respiratory: Normal respiratory effort.  No retractions. Lungs CTAB. Gastrointestinal: Soft and nontender. No distention.  No CVA tenderness. Genitourinary: deferred Musculoskeletal: No lower extremity tenderness nor edema.  No joint effusions. Neurologic:  Normal speech and language. No gross focal neurologic deficits are appreciated. No gait instability. Skin:  Skin is warm, dry and intact. No rash noted. Psychiatric: Mood is slightly anxious, affect is normal. Speech and behavior are normal.  ____________________________________________   LABS (all labs ordered are listed, but only abnormal results are displayed)  Labs Reviewed - No data to display ____________________________________________  EKG  none ____________________________________________  RADIOLOGY  none ____________________________________________   PROCEDURES  Procedure(s) performed: None  Critical Care performed: No  ____________________________________________   INITIAL IMPRESSION / ASSESSMENT AND PLAN / ED COURSE  Pertinent labs & imaging results that were available during my care of the patient were reviewed by me and considered in my medical decision making (see chart for details).  MARRION LESER is a 55 y.o. male with past medical history of hypertension, asthma, history of alcohol abuse, who presents for evaluation of anxiety ongoing for the past 4 days, gradual onset, constant since onset, currently severe. On exam, he is mildly a anxious-appearing but otherwise in no acute distress. His vital signs are notable for mild hypotension with systolic blood pressure in the 90s however the remainder of his vital signs are stable, he is not tachycardic or symptomatic. This is likely close to his new baseline as his blood pressure was 113/82 when he was seen here on 02/03/2016. We discussed that his primary care doctor is currently down-titrating his antihypertensive medications  because he is no longer requiring them as he was when he was drinking alcohol. I discussed that his mild hypotension limits my ability to prescribe him a short course of benzodiazepines which could further drop his blood pressure. I offered to have him stay in the emergency department to be seen by a psychiatrist in the morning however he reports he has a sick wife at home and he has to return to her. We discussed holding his lisinopril and metoprolol and following up with [facility] tomorrow; we also discussed not taking these BP meds until he can see his PCP in 2 days and they can recheck his blood pressure. We also discussed the need for close primary care follow-up and he is comfortable with the discharge plan. DC home. ____________________________________________   FINAL CLINICAL IMPRESSION(S) / ED DIAGNOSES  Final diagnoses:  Anxiety      NEW MEDICATIONS STARTED DURING THIS VISIT:  Discharge Medication List as of 03/31/2016  7:10 PM       Note:  This document was prepared using Dragon voice recognition software and may include unintentional dictation errors.    Joanne Gavel, MD 03/31/16 1919  Joanne Gavel, MD 03/31/16 [id number]

## 2016-03-31 NOTE — Discharge Instructions (Signed)
Do not take metoprolol or lisinopril until you see your primary care doctor later this wee. Follow up with your primary doctor as well as [facility] as soon as possible. Return if you develop lightheadedness, chest pain, difficulty breathing, fainting or near fainting, thoughts of wanting to help yourself or others, hallucinations or for any other concerns.

## 2016-03-31 NOTE — ED Notes (Signed)
EDP at bedside  

## 2016-03-31 NOTE — ED Notes (Signed)
States he was seen in [facility] in march and started on librium, states Friday night he went out and drank, restarted librium today at noon, states he only has 2 pills left in RX and needs a refill, [facility] and they could not see him today

## 2016-03-31 NOTE — ED Notes (Addendum)
States he ran out of his chlordiazepoxide. Now feels sweaty and shaky

## 2016-04-01 ENCOUNTER — Emergency Department
Admission: EM | Admit: 2016-04-01 | Discharge: 2016-04-01 | Disposition: A | Discharge status: Home / Self Care | Payer: Self-pay | Attending: Emergency Medicine | Admitting: Emergency Medicine

## 2016-04-01 ENCOUNTER — Encounter: Payer: Self-pay | Admitting: Emergency Medicine

## 2016-04-01 DIAGNOSIS — Z87891 Personal history of nicotine dependence: Secondary | ICD-10-CM | POA: Insufficient documentation

## 2016-04-01 DIAGNOSIS — J45909 Unspecified asthma, uncomplicated: Secondary | ICD-10-CM | POA: Insufficient documentation

## 2016-04-01 DIAGNOSIS — N289 Disorder of kidney and ureter, unspecified: Secondary | ICD-10-CM | POA: Insufficient documentation

## 2016-04-01 DIAGNOSIS — I10 Essential (primary) hypertension: Secondary | ICD-10-CM | POA: Insufficient documentation

## 2016-04-01 DIAGNOSIS — Z79899 Other long term (current) drug therapy: Secondary | ICD-10-CM | POA: Insufficient documentation

## 2016-04-01 DIAGNOSIS — Z792 Long term (current) use of antibiotics: Secondary | ICD-10-CM | POA: Insufficient documentation

## 2016-04-01 LAB — CBC WITH DIFFERENTIAL/PLATELET
Basophils Absolute: 0 10*3/uL (ref 0–0.1)
Basophils Relative: 0 %
Eosinophils Absolute: 0.1 10*3/uL (ref 0–0.7)
Eosinophils Relative: 1 %
HCT: 39.7 % — ABNORMAL LOW (ref 40.0–52.0)
Hemoglobin: 13.4 g/dL (ref 13.0–18.0)
Lymphocytes Relative: 28 %
Lymphs Abs: 2.6 10*3/uL (ref 1.0–3.6)
MCH: 28.4 pg (ref 26.0–34.0)
MCHC: 33.7 g/dL (ref 32.0–36.0)
MCV: 84.1 fL (ref 80.0–100.0)
Monocytes Absolute: 1.1 10*3/uL — ABNORMAL HIGH (ref 0.2–1.0)
Monocytes Relative: 12 %
Neutro Abs: 5.5 10*3/uL (ref 1.4–6.5)
Neutrophils Relative %: 59 %
Platelets: 222 10*3/uL (ref 150–440)
RBC: 4.72 MIL/uL (ref 4.40–5.90)
RDW: 15.8 % — ABNORMAL HIGH (ref 11.5–14.5)
WBC: 9.3 10*3/uL (ref 3.8–10.6)

## 2016-04-01 LAB — BASIC METABOLIC PANEL
Anion gap: 11 (ref 5–15)
BUN: 43 mg/dL — ABNORMAL HIGH (ref 6–20)
CO2: 24 mmol/L (ref 22–32)
Calcium: 9.8 mg/dL (ref 8.9–10.3)
Chloride: 97 mmol/L — ABNORMAL LOW (ref 101–111)
Creatinine, Ser: 2.65 mg/dL — ABNORMAL HIGH (ref 0.61–1.24)
GFR calc Af Amer: 29 mL/min — ABNORMAL LOW (ref >60)
GFR calc non Af Amer: 25 mL/min — ABNORMAL LOW (ref >60)
Glucose, Bld: 127 mg/dL — ABNORMAL HIGH (ref 65–99)
Potassium: 3.6 mmol/L (ref 3.5–5.1)
Sodium: 132 mmol/L — ABNORMAL LOW (ref 135–145)

## 2016-04-01 MED ORDER — LORAZEPAM 1 MG PO TABS: 1.0000 mg | ORAL_TABLET | Freq: Two times a day (BID) | ORAL | Status: DC | PRN

## 2016-04-01 MED ORDER — SODIUM CHLORIDE 0.9 % IV BOLUS (SEPSIS)
1000.0000 mL | Freq: Once | INTRAVENOUS | Status: AC
  Administered 2016-04-01: 1000 mL via INTRAVENOUS

## 2016-04-01 NOTE — ED Notes (Signed)
Pt. States he was seen here yesterday and instructed to follow-up with [facility]. Pt. Reports that he followed-up with [facility] this morning and they told him that they couldn't do anything for him at this time. Pt. Has concerns of blood pressure medications and whether to continue taking them or not because his pressures have been WDL or low today as well as yesterday so he hasn't received any anxiety medications.

## 2016-04-01 NOTE — ED Notes (Signed)
IV started and fluid bolus given. Pt in NAD at this time. Will continue to monitor.

## 2016-04-01 NOTE — ED Notes (Signed)
Pt sitting calmly and cooperative in hallway at this time. NAD noted at this time. Will continue to monitor for change in patient status/needs. Report received from [place], Therapist, sports.

## 2016-04-01 NOTE — ED Notes (Signed)
Was seen here yesterday for anxiety and discharged and encouraged to follow up with [facility] , pt did so this AM but unable to get an appointment till later this month, pt still has feelings of anxiety and is concerned of blood pressure issue that was addressed yesterday when it was '"low", "the doctor told me not to take my blood pressure medication"

## 2016-04-01 NOTE — ED Notes (Signed)
Pt given crackers and water as requested. NAD noted at this time.

## 2016-04-01 NOTE — ED Provider Notes (Signed)
Time Seen: Approximately 1630  I have reviewed the triage notes  Chief Complaint: Anxiety   History of Present Illness: Miguel [name] is a 55 y.o. male who presents with feelings of anxiety and has been noted to have some low blood pressure. Patient was just seen here yesterday his blood pressure was running low and he was advised to follow up with our [facility] for his anxiety. He states he attempted to follow up with primary care along with his primary physician which she states is at [facility]. Patient states he was concerned because his blood pressure is still been low. He wasn't sure whether to continue his blood pressure medications or not. Patient has history of alcohol abuse and states he hasn't drank in the last month and is concerned about some withdrawal-type symptoms. He denies any syncope. He denies any auditory or visual hallucinations. He denies any nausea, vomiting, diarrhea. He denies any melena or hematochezia.   Past Medical History  Diagnosis Date  . Hypertension   . Gout   . Renal colic   . Asthma   . GERD (gastroesophageal reflux disease)     Patient Active Problem List   Diagnosis Date Noted  . Alcohol abuse 02/05/2016  . Hypertension 12/05/2015  . Tachycardia 12/05/2015  . Gout 11/13/2015  . Chronic back pain 05/02/2015    Past Surgical History  Procedure Laterality Date  . Cholecystectomy  2012    Past Surgical History  Procedure Laterality Date  . Cholecystectomy  2012    Current Outpatient Rx  Name  Route  Sig  Dispense  Refill  . albuterol (VENTOLIN HFA) 108 (90 Base) MCG/ACT inhaler   Inhalation   Inhale 2 puffs into the lungs every 4 (four) hours as needed for wheezing or shortness of breath.         . allopurinol (ZYLOPRIM) 100 MG tablet   Oral   Take 100 mg by mouth daily.         Marland Kitchen amoxicillin (AMOXIL) 500 MG capsule   Oral   Take 1 capsule (500 mg total) by mouth 3 (three) times daily.   30 capsule   0   .  chlordiazePOXIDE (LIBRIUM) 10 MG capsule      Day 1-2: Take 1 tablet PO Q6H Day 3-4: Take 1 tablet PO Q8H Day 5-6: Take 1 tablet PO Q12H Day 7-8: Take 1 tablet Daily.   20 capsule   0   . EXPIRED: colchicine 0.6 MG tablet      1 tablet once or twice a day with food   40 tablet   2   . Dexlansoprazole 30 MG capsule   Oral   Take 30 mg by mouth daily.         Marland Kitchen esomeprazole (NEXIUM) 40 MG capsule   Oral   Take 40 mg by mouth daily at 12 noon.         . fluticasone (FLONASE) 50 MCG/ACT nasal spray   Each Nare   Place 1 spray into both nostrils daily.   16 g   0   . Fluticasone-Salmeterol (ADVAIR) 100-50 MCG/DOSE AEPB   Inhalation   Inhale 1 puff into the lungs 2 (two) times daily.         Marland Kitchen gabapentin (NEURONTIN) 300 MG capsule   Oral   Take 300 mg by mouth at bedtime.         . hydrochlorothiazide (HYDRODIURIL) 25 MG tablet   Oral  Take 25 mg by mouth daily.         Marland Kitchen ibuprofen (ADVIL,MOTRIN) 600 MG tablet   Oral   Take 1 tablet (600 mg total) by mouth every 6 (six) hours as needed.   30 tablet   0   . lisinopril (PRINIVIL,ZESTRIL) 20 MG tablet   Oral   Take 40 mg by mouth daily.          Marland Kitchen LORazepam (ATIVAN) 1 MG tablet   Oral   Take 1 tablet (1 mg total) by mouth 2 (two) times daily as needed for anxiety.   10 tablet   0   . metoprolol (LOPRESSOR) 50 MG tablet   Oral   Take 50 mg by mouth 2 (two) times daily.         . naproxen (EC NAPROSYN) 500 MG EC tablet   Oral   Take 1 tablet (500 mg total) by mouth 2 (two) times daily with a meal.   30 tablet   0   . nortriptyline (PAMELOR) 25 MG capsule   Oral   Take 25 mg by mouth at bedtime.         Marland Kitchen oxyCODONE-acetaminophen (ROXICET) 5-325 MG tablet   Oral   Take 1 tablet by mouth every 4 (four) hours as needed for severe pain.   15 tablet   0   . traMADol (ULTRAM) 50 MG tablet   Oral   Take 1 tablet (50 mg total) by mouth every 6 (six) hours as needed.   20 tablet   0      Allergies:  Review of patient's allergies indicates no known allergies.  Family History: No family history on file.  Social History: Social History  Substance Use Topics  . Smoking status: Former Research scientist (life sciences)  . Smokeless tobacco: None  . Alcohol Use: Yes     Comment: last drink March 11     Review of Systems:   10 point review of systems was performed and was otherwise negative:  Constitutional: No fever Eyes: No visual disturbances ENT: No sore throat, ear pain Cardiac: No chest pain Respiratory: No shortness of breath, wheezing, or stridor Abdomen: No abdominal pain, no vomiting, No diarrhea Endocrine: No weight loss, No night sweats Extremities: No peripheral edema, cyanosis Skin: No rashes, easy bruising Neurologic: No focal weakness, trouble with speech or swollowing Urologic: No dysuria, Hematuria, or urinary frequency   Physical Exam:  ED Triage Vitals  Enc Vitals Group     BP 04/01/16 1101 128/68 mmHg     Pulse Rate 04/01/16 1101 80     Resp 04/01/16 1101 18     Temp 04/01/16 1101 97.8 F (36.6 C)     Temp Source 04/01/16 1101 Oral     SpO2 04/01/16 1101 100 %     Weight 04/01/16 1101 200 lb (90.719 kg)     Height 04/01/16 1101 6' 1"  (1.854 m)     Head Cir --      Peak Flow --      Pain Score 04/01/16 1202 0     Pain Loc --      Pain Edu? --      Excl. in [place]? --     General: Awake , Alert , and Oriented times 3; GCS 15 Head: Normal cephalic , atraumatic Eyes: Pupils equal , round, reactive to light Nose/Throat: No nasal drainage, patent upper airway without erythema or exudate.  Neck: Supple, Full range of motion, No anterior adenopathy or palpable  thyroid masses Lungs: Clear to ascultation without wheezes , rhonchi, or rales Heart: Regular rate, regular rhythm without murmurs , gallops , or rubs Abdomen: Soft, non tender without rebound, guarding , or rigidity; bowel sounds positive and symmetric in all 4 quadrants. No organomegaly .         Extremities: 2 plus symmetric pulses. No edema, clubbing or cyanosis Neurologic: normal ambulation, Motor symmetric without deficits, sensory intact Skin: warm, dry, no rashes   Labs:   All laboratory work was reviewed including any pertinent negatives or positives listed below:  Labs Reviewed  CBC WITH DIFFERENTIAL/PLATELET - Abnormal; Notable for the following:    HCT 39.7 (*)    RDW 15.8 (*)    Monocytes Absolute 1.1 (*)    All other components within normal limits  BASIC METABOLIC PANEL - Abnormal; Notable for the following:    Sodium 132 (*)    Chloride 97 (*)    Glucose, Bld 127 (*)    BUN 43 (*)    Creatinine, Ser 2.65 (*)    GFR calc non Af Amer 25 (*)    GFR calc Af Amer 29 (*)    All other components within normal limits   Patient was noted to have an increased BUN and creatinine.  ED Course:  Patient has a history of some mild renal insufficiency but I felt this was most likely increased by dehydration and also his blood pressure medication which does include a diuretic. The patient had an IV established and was given a liter of fluid and was advised to hold all blood pressure medications until he can be seen by his primary physician. He is been advised drink plenty of fluids and I did prescribe him some Ativan for anxiety on an outpatient basis. Does not appear to be in any signs of alcohol withdrawal at this time. I suspect since he stopped drinking alcohol that likely his blood pressure was decreased and now does not require as much blood pressure medication.    Assessment Anxiety History of alcohol abuse Renal insufficiency   Final Clinical Impression:  Final diagnoses:  Renal insufficiency     Plan: * Outpatient Patient was advised to return immediately if condition worsens. Patient was advised to follow up with their primary care physician or other specialized physicians involved in their outpatient care. The patient and/or family member/power of  attorney had laboratory results reviewed at the bedside. All questions and concerns were addressed and appropriate discharge instructions were distributed by the nursing staff.             Daymon Larsen, MD 04/01/16 [phone]

## 2016-04-01 NOTE — ED Notes (Signed)
Stopped patient's fluids due to infiltration of patient's IV. This RN attempted 2 more times for IV access, was unsuccessful. Sharyn Blitz, RN to bedside to assist.

## 2016-04-07 ENCOUNTER — Ambulatory Visit: Payer: Self-pay | Admitting: Urology

## 2016-04-07 VITALS — BP 144/88 | HR 97 | Ht 73.0 in | Wt 193.0 lb

## 2016-04-07 DIAGNOSIS — M109 Gout, unspecified: Secondary | ICD-10-CM

## 2016-04-07 DIAGNOSIS — R944 Abnormal results of kidney function studies: Secondary | ICD-10-CM

## 2016-04-07 NOTE — Progress Notes (Signed)
Patient: Miguel [name] Male    DOB: [birth year]   55 y.o.   MRN: [id number] Visit Date: 04/07/2016  Today's Provider: [place]   Chief Complaint  Patient presents with  . Hypertension  . Tachycardia  . Alcohol Intoxication   Subjective:    HPI Patient was seen in the ED twice  Recently due to anxiety and ETOH withdrawal.  Was given IV fluid.  Not taking metoprolol.  No taking HCTZ.  Not using inhalers due to making him more anxious.       No Known Allergies Previous Medications   ALBUTEROL (VENTOLIN HFA) 108 (90 BASE) MCG/ACT INHALER    Inhale 2 puffs into the lungs every 4 (four) hours as needed for wheezing or shortness of breath.   ALLOPURINOL (ZYLOPRIM) 100 MG TABLET    Take 100 mg by mouth daily.   AMOXICILLIN (AMOXIL) 500 MG CAPSULE    Take 1 capsule (500 mg total) by mouth 3 (three) times daily.   CHLORDIAZEPOXIDE (LIBRIUM) 10 MG CAPSULE    Day 1-2: Take 1 tablet PO Q6H Day 3-4: Take 1 tablet PO Q8H Day 5-6: Take 1 tablet PO Q12H Day 7-8: Take 1 tablet Daily.   COLCHICINE 0.6 MG TABLET    1 tablet once or twice a day with food   DEXLANSOPRAZOLE 30 MG CAPSULE    Take 30 mg by mouth daily.   ESOMEPRAZOLE (NEXIUM) 40 MG CAPSULE    Take 40 mg by mouth daily at 12 noon.   FLUTICASONE (FLONASE) 50 MCG/ACT NASAL SPRAY    Place 1 spray into both nostrils daily.   FLUTICASONE-SALMETEROL (ADVAIR) 100-50 MCG/DOSE AEPB    Inhale 1 puff into the lungs 2 (two) times daily.   GABAPENTIN (NEURONTIN) 300 MG CAPSULE    Take 300 mg by mouth at bedtime.   HYDROCHLOROTHIAZIDE (HYDRODIURIL) 25 MG TABLET    Take 25 mg by mouth daily. Reported on 04/07/2016   IBUPROFEN (ADVIL,MOTRIN) 600 MG TABLET    Take 1 tablet (600 mg total) by mouth every 6 (six) hours as needed.   LISINOPRIL (PRINIVIL,ZESTRIL) 20 MG TABLET    Take 20 mg by mouth daily.    LORAZEPAM (ATIVAN) 1 MG TABLET    Take 1 tablet (1 mg total) by mouth 2 (two) times daily as needed for anxiety.   METOPROLOL (LOPRESSOR) 50 MG  TABLET    Take 50 mg by mouth 2 (two) times daily. Reported on 04/07/2016   NAPROXEN (EC NAPROSYN) 500 MG EC TABLET    Take 1 tablet (500 mg total) by mouth 2 (two) times daily with a meal.   NORTRIPTYLINE (PAMELOR) 25 MG CAPSULE    Take 25 mg by mouth at bedtime. Reported on 04/07/2016   OXYCODONE-ACETAMINOPHEN (ROXICET) 5-325 MG TABLET    Take 1 tablet by mouth every 4 (four) hours as needed for severe pain.   TRAMADOL (ULTRAM) 50 MG TABLET    Take 1 tablet (50 mg total) by mouth every 6 (six) hours as needed.    Review of Systems  Social History  Substance Use Topics  . Smoking status: Former Research scientist (life sciences)  . Smokeless tobacco: Not on file  . Alcohol Use: Yes     Comment: last drink March 11   Objective:   BP 144/88 mmHg  Pulse 97  Ht 6\' 1"  (1.854 m)  Wt 193 lb (87.544 kg)  BMI 25.47 kg/m2  SpO2 97%  Physical Exam      Assessment & Plan:  1. Decreased GFR  -recheck blood work this week  -RTC in one week  2. Anxiety  -reassured       [facility]

## 2016-04-09 ENCOUNTER — Other Ambulatory Visit: Payer: Self-pay

## 2016-04-09 DIAGNOSIS — I10 Essential (primary) hypertension: Secondary | ICD-10-CM

## 2016-04-10 LAB — COMPREHENSIVE METABOLIC PANEL
ALT: 6 IU/L (ref 0–44)
AST: 10 IU/L (ref 0–40)
Albumin/Globulin Ratio: 1.5 (ref 1.2–2.2)
Albumin: 3.6 g/dL (ref 3.5–5.5)
Alkaline Phosphatase: 73 IU/L (ref 39–117)
BUN/Creatinine Ratio: 12 (ref 9–20)
BUN: 12 mg/dL (ref 6–24)
Bilirubin Total: 0.5 mg/dL (ref 0.0–1.2)
CO2: 23 mmol/L (ref 18–29)
Calcium: 9 mg/dL (ref 8.7–10.2)
Chloride: 105 mmol/L (ref 96–106)
Creatinine, Ser: 1.01 mg/dL (ref 0.76–1.27)
GFR calc Af Amer: 96 mL/min/{1.73_m2} (ref >59)
GFR calc non Af Amer: 83 mL/min/{1.73_m2} (ref >59)
Globulin, Total: 2.4 g/dL (ref 1.5–4.5)
Glucose: 102 mg/dL — ABNORMAL HIGH (ref 65–99)
Potassium: 4 mmol/L (ref 3.5–5.2)
Sodium: 143 mmol/L (ref 134–144)
Total Protein: 6 g/dL (ref 6.0–8.5)

## 2016-04-14 ENCOUNTER — Ambulatory Visit: Payer: Self-pay

## 2016-04-23 ENCOUNTER — Ambulatory Visit: Payer: Self-pay

## 2016-04-29 ENCOUNTER — Encounter: Payer: Self-pay | Admitting: Emergency Medicine

## 2016-04-29 ENCOUNTER — Emergency Department
Admission: EM | Admit: 2016-04-29 | Discharge: 2016-04-29 | Disposition: A | Discharge status: Home / Self Care | Payer: Self-pay | Attending: Student | Admitting: Student

## 2016-04-29 ENCOUNTER — Emergency Department
Admission: EM | Admit: 2016-04-29 | Discharge: 2016-04-29 | Disposition: A | Discharge status: Home / Self Care | Payer: Self-pay | Attending: Emergency Medicine | Admitting: Emergency Medicine

## 2016-04-29 DIAGNOSIS — Z791 Long term (current) use of non-steroidal anti-inflammatories (NSAID): Secondary | ICD-10-CM | POA: Insufficient documentation

## 2016-04-29 DIAGNOSIS — M25562 Pain in left knee: Secondary | ICD-10-CM | POA: Insufficient documentation

## 2016-04-29 DIAGNOSIS — Z79899 Other long term (current) drug therapy: Secondary | ICD-10-CM | POA: Insufficient documentation

## 2016-04-29 DIAGNOSIS — J45909 Unspecified asthma, uncomplicated: Secondary | ICD-10-CM | POA: Insufficient documentation

## 2016-04-29 DIAGNOSIS — M7918 Myalgia, other site: Secondary | ICD-10-CM

## 2016-04-29 DIAGNOSIS — M109 Gout, unspecified: Secondary | ICD-10-CM | POA: Insufficient documentation

## 2016-04-29 DIAGNOSIS — Z87891 Personal history of nicotine dependence: Secondary | ICD-10-CM | POA: Insufficient documentation

## 2016-04-29 DIAGNOSIS — M25521 Pain in right elbow: Secondary | ICD-10-CM | POA: Insufficient documentation

## 2016-04-29 DIAGNOSIS — M2550 Pain in unspecified joint: Secondary | ICD-10-CM

## 2016-04-29 DIAGNOSIS — I10 Essential (primary) hypertension: Secondary | ICD-10-CM | POA: Insufficient documentation

## 2016-04-29 DIAGNOSIS — M25572 Pain in left ankle and joints of left foot: Secondary | ICD-10-CM | POA: Insufficient documentation

## 2016-04-29 DIAGNOSIS — M791 Myalgia: Secondary | ICD-10-CM | POA: Insufficient documentation

## 2016-04-29 DIAGNOSIS — E876 Hypokalemia: Secondary | ICD-10-CM | POA: Insufficient documentation

## 2016-04-29 LAB — COMPREHENSIVE METABOLIC PANEL
ALT: 18 U/L (ref 17–63)
AST: 19 U/L (ref 15–41)
Albumin: 3.5 g/dL (ref 3.5–5.0)
Alkaline Phosphatase: 67 U/L (ref 38–126)
Anion gap: 9 (ref 5–15)
BUN: 7 mg/dL (ref 6–20)
CO2: 26 mmol/L (ref 22–32)
Calcium: 9.2 mg/dL (ref 8.9–10.3)
Chloride: 105 mmol/L (ref 101–111)
Creatinine, Ser: 0.94 mg/dL (ref 0.61–1.24)
GFR calc Af Amer: >60 mL/min (ref >60)
GFR calc non Af Amer: >60 mL/min (ref >60)
Glucose, Bld: 117 mg/dL — ABNORMAL HIGH (ref 65–99)
Potassium: 2.7 mmol/L — CL (ref 3.5–5.1)
Sodium: 140 mmol/L (ref 135–145)
Total Bilirubin: 0.9 mg/dL (ref 0.3–1.2)
Total Protein: 6.1 g/dL — ABNORMAL LOW (ref 6.5–8.1)

## 2016-04-29 LAB — CBC WITH DIFFERENTIAL/PLATELET
Basophils Absolute: 0 10*3/uL (ref 0–0.1)
Basophils Relative: 0 %
Eosinophils Absolute: 0 10*3/uL (ref 0–0.7)
Eosinophils Relative: 1 %
HCT: 35.6 % — ABNORMAL LOW (ref 40.0–52.0)
Hemoglobin: 11.9 g/dL — ABNORMAL LOW (ref 13.0–18.0)
Lymphocytes Relative: 19 %
Lymphs Abs: 1.2 10*3/uL (ref 1.0–3.6)
MCH: 31.4 pg (ref 26.0–34.0)
MCHC: 33.5 g/dL (ref 32.0–36.0)
MCV: 93.9 fL (ref 80.0–100.0)
Monocytes Absolute: 1 10*3/uL (ref 0.2–1.0)
Monocytes Relative: 17 %
Neutro Abs: 3.7 10*3/uL (ref 1.4–6.5)
Neutrophils Relative %: 63 %
Platelets: 155 10*3/uL (ref 150–440)
RBC: 3.8 MIL/uL — ABNORMAL LOW (ref 4.40–5.90)
RDW: 18.9 % — ABNORMAL HIGH (ref 11.5–14.5)
WBC: 6 10*3/uL (ref 3.8–10.6)

## 2016-04-29 LAB — URIC ACID: Uric Acid, Serum: 6.1 mg/dL (ref 4.4–7.6)

## 2016-04-29 LAB — MAGNESIUM: Magnesium: 1.7 mg/dL (ref 1.7–2.4)

## 2016-04-29 MED ORDER — HYDROCODONE-ACETAMINOPHEN 5-325 MG PO TABS: 1.0000 | ORAL_TABLET | ORAL | Status: DC | PRN

## 2016-04-29 MED ORDER — POTASSIUM CHLORIDE ER 10 MEQ PO TBCR: 10.0000 meq | EXTENDED_RELEASE_TABLET | Freq: Every day | ORAL | Status: DC

## 2016-04-29 MED ORDER — POTASSIUM CHLORIDE CRYS ER 20 MEQ PO TBCR
40.0000 meq | EXTENDED_RELEASE_TABLET | Freq: Once | ORAL | Status: AC
  Administered 2016-04-29: 40 meq via ORAL
  Filled 2016-04-29: qty 2

## 2016-04-29 NOTE — Discharge Instructions (Signed)

## 2016-04-29 NOTE — ED Provider Notes (Signed)
[facility] Emergency Department Provider Note ____________________________________________  Time seen: Approximately 10:06 PM  I have reviewed the triage vital signs and the nursing notes.   HISTORY  Chief Complaint Foot Pain    HPI Miguel [name] is a 55 y.o. male who presents to the emergency department for a second visit today. He has returned to the emergency department without having his medications filled. He feels that he is having gout attack in the right elbow, left knee and left small toe. He did not take his allopurinol today because of the suspected acute attack. He has taken 2 colchicine without relief. He is angry that a uric acid level was not completed on his first visit and that he did not receive IV pain medication prior to discharge earlier today.  Past Medical History  Diagnosis Date  . Hypertension   . Gout   . Renal colic   . Asthma   . GERD (gastroesophageal reflux disease)     Patient Active Problem List   Diagnosis Date Noted  . Alcohol abuse 02/05/2016  . Hypertension 12/05/2015  . Tachycardia 12/05/2015  . Gout 11/13/2015  . Chronic back pain 05/02/2015    Past Surgical History  Procedure Laterality Date  . Cholecystectomy  2012    Current Outpatient Rx  Name  Route  Sig  Dispense  Refill  . albuterol (VENTOLIN HFA) 108 (90 Base) MCG/ACT inhaler   Inhalation   Inhale 2 puffs into the lungs every 4 (four) hours as needed for wheezing or shortness of breath.         . allopurinol (ZYLOPRIM) 100 MG tablet   Oral   Take 100 mg by mouth daily.         Marland Kitchen EXPIRED: colchicine 0.6 MG tablet      1 tablet once or twice a day with food   40 tablet   2   . Dexlansoprazole 30 MG capsule   Oral   Take 30 mg by mouth daily.         Marland Kitchen esomeprazole (NEXIUM) 40 MG capsule   Oral   Take 40 mg by mouth daily at 12 noon.         . Fluticasone-Salmeterol (ADVAIR) 100-50 MCG/DOSE AEPB   Inhalation   Inhale 1 puff into  the lungs 2 (two) times daily.         Marland Kitchen gabapentin (NEURONTIN) 300 MG capsule   Oral   Take 300 mg by mouth at bedtime.         . hydrochlorothiazide (HYDRODIURIL) 25 MG tablet   Oral   Take 25 mg by mouth daily. Reported on 04/07/2016         . HYDROcodone-acetaminophen (NORCO/VICODIN) 5-325 MG tablet   Oral   Take 1 tablet by mouth every 4 (four) hours as needed for moderate pain.   5 tablet   0   . ibuprofen (ADVIL,MOTRIN) 600 MG tablet   Oral   Take 1 tablet (600 mg total) by mouth every 6 (six) hours as needed.   30 tablet   0   . lisinopril (PRINIVIL,ZESTRIL) 20 MG tablet   Oral   Take 20 mg by mouth daily.          Marland Kitchen LORazepam (ATIVAN) 1 MG tablet   Oral   Take 1 tablet (1 mg total) by mouth 2 (two) times daily as needed for anxiety.   10 tablet   0   . metoprolol (LOPRESSOR) 50 MG  tablet   Oral   Take 50 mg by mouth 2 (two) times daily. Reported on 04/07/2016         . naproxen (EC NAPROSYN) 500 MG EC tablet   Oral   Take 1 tablet (500 mg total) by mouth 2 (two) times daily with a meal.   30 tablet   0   . nortriptyline (PAMELOR) 25 MG capsule   Oral   Take 25 mg by mouth at bedtime. Reported on 04/07/2016         . oxyCODONE-acetaminophen (ROXICET) 5-325 MG tablet   Oral   Take 1 tablet by mouth every 4 (four) hours as needed for severe pain.   15 tablet   0   . potassium chloride (K-DUR) 10 MEQ tablet   Oral   Take 1 tablet (10 mEq total) by mouth daily.   5 tablet   0     Allergies Review of patient's allergies indicates no known allergies.  No family history on file.  Social History Social History  Substance Use Topics  . Smoking status: Former Research scientist (life sciences)  . Smokeless tobacco: Not on file  . Alcohol Use: Yes     Comment: last drink March 11    Review of Systems Constitutional: No recent illness. Cardiovascular: Denies chest pain or palpitations. Respiratory: Denies shortness of breath. Musculoskeletal: Pain in right  elbow, left knee and left small toe. Skin: Negative for rash, wound, lesion. Neurological: Negative for focal weakness or numbness.  ____________________________________________   PHYSICAL EXAM:  VITAL SIGNS: ED Triage Vitals  Enc Vitals Group     BP 04/29/16 2005 139/93 mmHg     Pulse Rate 04/29/16 2005 94     Resp 04/29/16 2005 20     Temp 04/29/16 2005 98.9 F (37.2 C)     Temp Source 04/29/16 2005 Oral     SpO2 04/29/16 2005 100 %     Weight 04/29/16 2005 193 lb (87.544 kg)     Height 04/29/16 2005 6\' 1"  (1.854 m)     Head Cir --      Peak Flow --      Pain Score 04/29/16 2006 8     Pain Loc --      Pain Edu? --      Excl. in [place]? --     Constitutional: Alert and oriented. Well appearing and in no acute distress. Eyes: Conjunctivae are normal. EOMI. Head: Atraumatic. Neck: No stridor.  Respiratory: Normal respiratory effort.   Musculoskeletal: No edema or erythema of the joints. Full ROM throughout. Neurologic:  Normal speech and language. No gross focal neurologic deficits are appreciated. Speech is normal. No gait instability. Skin:  Skin is warm, dry and intact. Atraumatic. Psychiatric: Mood and affect are normal. Speech and behavior are normal.  ____________________________________________   LABS (all labs ordered are listed, but only abnormal results are displayed)  Labs Reviewed  URIC ACID   ____________________________________________  RADIOLOGY  Not indicated. ____________________________________________   PROCEDURES  Procedure(s) performed: None   ____________________________________________   INITIAL IMPRESSION / ASSESSMENT AND PLAN / ED COURSE  Pertinent labs & imaging results that were available during my care of the patient were reviewed by me and considered in my medical decision making (see chart for details).  Uric acid level is negative. He was advised that he will need to take the medications, including the potassium as  prescribed and that the low potassium may be causing some muscle cramping/pain but the source is not  gout based on both physical exam and negative uric acid level. He was encouraged to follow up with the primary care provider of his choice for recheck of his potassium. ____________________________________________   FINAL CLINICAL IMPRESSION(S) / ED DIAGNOSES  Final diagnoses:  [place], FNP 04/29/16 2346  Joanne Gavel, MD 04/30/16 0040

## 2016-04-29 NOTE — ED Notes (Addendum)
Pt to triage via w/c with no distress noted; Pt reports here this morning for left foot pain and knee pain and right elbow; no redness or swelling noted st K+ was checked and received PO K+ and received a rx to continue; st took a vicodin at noon but continues to have pain; pt st "I was hoping that they would just give me some pain medicine in my IV; I know it is gout"

## 2016-04-29 NOTE — ED Provider Notes (Signed)
[facility] Emergency Department Provider Note   ____________________________________________  Time seen: Approximately 7:57 AM  I have reviewed the triage vital signs and the nursing notes.   HISTORY  Chief Complaint No chief complaint on file.    HPI Miguel [name] is a 55 y.o. male is here with complaint of left ankle and bilateral knees hurtingsince yesterday. Patient states there is been no history of injury. Patient states he has not noted any redness or swelling. Patient continues to take his allopurinol also is took colchicine this morning. Patient does have a history of gout. He also has a history of having a tooth extraction last week and was given some pain medication for this. He has had some nausea since his tooth extraction and has been eating less. He states that tonight he has an  [facility] his lab work and his "kidney functions". Patient has a history of alcohol abuse but states that currently he is not drinking any alcohol. Patient denies any fever, chills,  or vomiting.  Patient is requesting pain medication however patient is the driver of his vehicle and is alone in the emergency room. He rates his pain as an 8 out of 10.   Past Medical History  Diagnosis Date  . Hypertension   . Gout   . Renal colic   . Asthma   . GERD (gastroesophageal reflux disease)     Patient Active Problem List   Diagnosis Date Noted  . Alcohol abuse 02/05/2016  . Hypertension 12/05/2015  . Tachycardia 12/05/2015  . Gout 11/13/2015  . Chronic back pain 05/02/2015    Past Surgical History  Procedure Laterality Date  . Cholecystectomy  2012    Current Outpatient Rx  Name  Route  Sig  Dispense  Refill  . albuterol (VENTOLIN HFA) 108 (90 Base) MCG/ACT inhaler   Inhalation   Inhale 2 puffs into the lungs every 4 (four) hours as needed for wheezing or shortness of breath.         . allopurinol (ZYLOPRIM) 100 MG tablet   Oral   Take 100 mg by mouth daily.         Marland Kitchen EXPIRED: colchicine 0.6 MG tablet      1 tablet once or twice a day with food   40 tablet   2   . Dexlansoprazole 30 MG capsule   Oral   Take 30 mg by mouth daily.         Marland Kitchen esomeprazole (NEXIUM) 40 MG capsule   Oral   Take 40 mg by mouth daily at 12 noon.         . Fluticasone-Salmeterol (ADVAIR) 100-50 MCG/DOSE AEPB   Inhalation   Inhale 1 puff into the lungs 2 (two) times daily.         Marland Kitchen gabapentin (NEURONTIN) 300 MG capsule   Oral   Take 300 mg by mouth at bedtime.         . hydrochlorothiazide (HYDRODIURIL) 25 MG tablet   Oral   Take 25 mg by mouth daily. Reported on 04/07/2016         . HYDROcodone-acetaminophen (NORCO/VICODIN) 5-325 MG tablet   Oral   Take 1 tablet by mouth every 4 (four) hours as needed for moderate pain.   5 tablet   0   . ibuprofen (ADVIL,MOTRIN) 600 MG tablet   Oral   Take 1 tablet (600 mg total) by mouth every 6 (six) hours  as needed.   30 tablet   0   . lisinopril (PRINIVIL,ZESTRIL) 20 MG tablet   Oral   Take 20 mg by mouth daily.          Marland Kitchen LORazepam (ATIVAN) 1 MG tablet   Oral   Take 1 tablet (1 mg total) by mouth 2 (two) times daily as needed for anxiety.   10 tablet   0   . metoprolol (LOPRESSOR) 50 MG tablet   Oral   Take 50 mg by mouth 2 (two) times daily. Reported on 04/07/2016         . naproxen (EC NAPROSYN) 500 MG EC tablet   Oral   Take 1 tablet (500 mg total) by mouth 2 (two) times daily with a meal.   30 tablet   0   . nortriptyline (PAMELOR) 25 MG capsule   Oral   Take 25 mg by mouth at bedtime. Reported on 04/07/2016         . oxyCODONE-acetaminophen (ROXICET) 5-325 MG tablet   Oral   Take 1 tablet by mouth every 4 (four) hours as needed for severe pain.   15 tablet   0   . potassium chloride (K-DUR) 10 MEQ tablet   Oral   Take 1 tablet (10 mEq total) by mouth daily.   5 tablet   0     Allergies Review of patient's allergies  indicates no known allergies.  No family history on file.  Social History Social History  Substance Use Topics  . Smoking status: Former Research scientist (life sciences)  . Smokeless tobacco: None  . Alcohol Use: Yes     Comment: last drink March 11    Review of Systems Constitutional: No fever/chills ENT: No sore throat.Positive dental pain status post extraction. Cardiovascular: Denies chest pain. Respiratory: Denies shortness of breath. Gastrointestinal: No abdominal pain.  Positive nausea, no vomiting.   Musculoskeletal: Negative for back pain. Positive left ankle pain. Positive bilateral knee pain. Skin: Negative for rash. Neurological: Negative for headaches, focal weakness or numbness.  10-point ROS otherwise negative.  ____________________________________________   PHYSICAL EXAM:  VITAL SIGNS: ED Triage Vitals  Enc Vitals Group     BP 04/29/16 0745 163/89 mmHg     Pulse Rate 04/29/16 0745 78     Resp 04/29/16 0745 18     Temp 04/29/16 0745 98.5 F (36.9 C)     Temp Source 04/29/16 0745 Oral     SpO2 04/29/16 0745 99 %     Weight 04/29/16 0745 193 lb (87.544 kg)     Height 04/29/16 0745 6\' 1"  (1.854 m)     Head Cir --      Peak Flow --      Pain Score 04/29/16 0739 8     Pain Loc --      Pain Edu? --      Excl. in [place]? --     Constitutional: Alert and oriented. Well appearing and in no acute distress. Eyes: Conjunctivae are normal. PERRL. EOMI. Head: Atraumatic. Nose: No congestion/rhinnorhea. Neck: No stridor.   Cardiovascular: Normal rate, regular rhythm. Grossly normal heart sounds.  Good peripheral circulation. Respiratory: Normal respiratory effort.  No retractions. Lungs CTAB. Musculoskeletal: Moves upper and lower extremities without difficulty. There is no gross deformity, erythema, edema or warmth to the joints of the left ankle for bilateral knees. There is no edema noted of the lower extremities. No effusion is seen at joints. Neurologic:  Normal speech and language.  No gross  focal neurologic deficits are appreciated. No gait instability. Skin:  Skin is warm, dry and intact. No rash noted. Psychiatric: Mood and affect are normal. Speech and behavior are normal.  ____________________________________________   LABS (all labs ordered are listed, but only abnormal results are displayed)  Labs Reviewed  COMPREHENSIVE METABOLIC PANEL - Abnormal; Notable for the following:    Potassium 2.7 (*)    Glucose, Bld 117 (*)    Total Protein 6.1 (*)    All other components within normal limits  CBC WITH DIFFERENTIAL/PLATELET - Abnormal; Notable for the following:    RBC 3.80 (*)    Hemoglobin 11.9 (*)    HCT 35.6 (*)    RDW 18.9 (*)    All other components within normal limits  MAGNESIUM     PROCEDURES  Procedure(s) performed: None  Critical Care performed: No  ____________________________________________   INITIAL IMPRESSION / ASSESSMENT AND PLAN / ED COURSE  Pertinent labs & imaging results that were available during my care of the patient were reviewed by me and considered in my medical decision making (see chart for details).  Patient was given K-Dur for 40 mEq while in the emergency room. Patient's potassium was 2.7. Patient continued to complain that he did not eat foods with potassium in them therefore there could be nothing wrong with his potassium. Patient was encouraged to keep his appointment [facility] to discuss his lab work. Patient was given a prescription for potassium chloride 10 mEq one every day for 5 days and Norco one every 4 hours if needed for pain #5 no refill. Patient became argumentative and did not want to leave the emergency room. Numerous times it was explained to him that he was driving and was lying and therefore the emergency room did not give narcotics to take while in the emergency room.  ____________________________________________   FINAL CLINICAL IMPRESSION(S) / ED DIAGNOSES  Final diagnoses:    Muscle ache of extremity  Hypokalemia      NEW MEDICATIONS STARTED DURING THIS VISIT:  Discharge Medication List as of 04/29/2016 10:15 AM    START taking these medications   Details  HYDROcodone-acetaminophen (NORCO/VICODIN) 5-325 MG tablet Take 1 tablet by mouth every 4 (four) hours as needed for moderate pain., Starting 04/29/2016, Until Discontinued, Print    potassium chloride (K-DUR) 10 MEQ tablet Take 1 tablet (10 mEq total) by mouth daily., Starting 04/29/2016, Until Discontinued, Print         Note:  This document was prepared using Dragon voice recognition software and may include unintentional dictation errors.    Johnn Hai, PA-C 04/29/16 [place], MD 04/29/16 [phone]

## 2016-04-29 NOTE — ED Notes (Signed)
Discharge instructions reviewed with patient. Questions fielded by this RN. Patient verbalizes understanding of instructions. Patient discharged home in stable condition per [facility] NP. No acute distress noted at time of discharge.

## 2016-04-29 NOTE — Discharge Instructions (Signed)
Musculoskeletal Pain Musculoskeletal pain is muscle and boney aches and pains. These pains can occur in any part of the body. Your caregiver may treat you without knowing the cause of the pain. They may treat you if blood or urine tests, X-rays, and other tests were normal.  CAUSES There is often not a definite cause or reason for these pains. These pains may be caused by a type of germ (virus). The discomfort may also come from overuse. Overuse includes working out too hard when your body is not fit. Boney aches also come from weather changes. Bone is sensitive to atmospheric pressure changes. HOME CARE INSTRUCTIONS   Ask when your test results will be ready. Make sure you get your test results.  Only take over-the-counter or prescription medicines for pain, discomfort, or fever as directed by your caregiver. If you were given medications for your condition, do not drive, operate machinery or power tools, or sign legal documents for 24 hours. Do not drink alcohol. Do not take sleeping pills or other medications that may interfere with treatment.  Continue all activities unless the activities cause more pain. When the pain lessens, slowly resume normal activities. Gradually increase the intensity and duration of the activities or exercise.  During periods of severe pain, bed rest may be helpful. Lay or sit in any position that is comfortable.  Putting ice on the injured area.  Put ice in a bag.  Place a towel between your skin and the bag.  Leave the ice on for 15 to 20 minutes, 3 to 4 times a day.  Follow up with your caregiver for continued problems and no reason can be found for the pain. If the pain becomes worse or does not go away, it may be necessary to repeat tests or do additional testing. Your caregiver may need to look further for a possible cause. SEEK IMMEDIATE MEDICAL CARE IF:  You have pain that is getting worse and is not relieved by medications.  You develop chest pain  that is associated with shortness or breath, sweating, feeling sick to your stomach (nauseous), or throw up (vomit).  Your pain becomes localized to the abdomen.  You develop any new symptoms that seem different or that concern you. MAKE SURE YOU:   Understand these instructions.  Will watch your condition.  Will get help right away if you are not doing well or get worse.   This information is not intended to replace advice given to you by your health care provider. Make sure you discuss any questions you have with your health care provider.   Document Released: 11/02/2005 Document Revised: 01/25/2012 Document Reviewed: 07/07/2013 Elsevier Interactive Patient Education 2016 [place] Appointment with [facility] tonight. Take these papers along with you to your appointment. Potassium in the emergency room today was 2.7 You were given a potassium tablet while in the emergency room and also a prescription for the same for the next 5 days. We discussed increase in potassium rich foods. A list of these was also given to you. Norco as needed for pain. Discuss any continued further pain medication with your doctor to night.

## 2016-04-29 NOTE — ED Notes (Signed)
States he developed pain to left ankle yesterday  Then this am pain is to both knees  No redness or swelling noted and denies any injury. also has had a tooth extracted last week ..conts to pain with some nausea d/t tooth extraction

## 2016-04-29 NOTE — ED Notes (Signed)
Pt in bed resting Pt presents with Right elbow and both knee pain. No redness or swelling noted. Pt reports being here yesterday and  States  K+ was checked and received PO K+ and received a rx to continue; pt took a vicodin at noon but continues to have pain; pt st "I was hoping that they would just give me some pain medicine in a  IV; I know it is my gout and I hope they check uric acid"

## 2016-04-30 ENCOUNTER — Ambulatory Visit: Payer: Self-pay | Admitting: Urology

## 2016-04-30 VITALS — BP 145/90 | HR 84 | Temp 99.1°F | Resp 16 | Ht 73.0 in | Wt 187.0 lb

## 2016-04-30 DIAGNOSIS — M1000 Idiopathic gout, unspecified site: Secondary | ICD-10-CM

## 2016-04-30 MED ORDER — POTASSIUM CHLORIDE ER 10 MEQ PO TBCR: 10.0000 meq | EXTENDED_RELEASE_TABLET | Freq: Every day | ORAL | Status: DC

## 2016-04-30 MED ORDER — COLCHICINE 0.6 MG PO TABS: ORAL_TABLET | ORAL | Status: DC

## 2016-04-30 NOTE — Progress Notes (Signed)
  Patient: Miguel [name] Male    DOB: 1961/10/23   55 y.o.   MRN: [id number] Visit Date: 04/30/2016  Today's Provider: [place]   Chief Complaint  Patient presents with  . Results    Kidney function labs   Subjective:    HPI Patient was seen in the ED twice  Recently due to anxiety and ETOH withdrawal.  Was given IV fluid.  Not taking metoprolol.  No taking HCTZ.  Not using inhalers due to making him more anxious.       Patient with gout attack.  Low potassium.    No Known Allergies Previous Medications   ALBUTEROL (VENTOLIN HFA) 108 (90 BASE) MCG/ACT INHALER    Inhale 2 puffs into the lungs every 4 (four) hours as needed for wheezing or shortness of breath.   ALLOPURINOL (ZYLOPRIM) 100 MG TABLET    Take 100 mg by mouth daily.   DEXLANSOPRAZOLE 30 MG CAPSULE    Take 30 mg by mouth daily.   ESOMEPRAZOLE (NEXIUM) 40 MG CAPSULE    Take 40 mg by mouth daily at 12 noon. Reported on 04/30/2016   FLUTICASONE-SALMETEROL (ADVAIR) 100-50 MCG/DOSE AEPB    Inhale 1 puff into the lungs 2 (two) times daily.   GABAPENTIN (NEURONTIN) 300 MG CAPSULE    Take 300 mg by mouth at bedtime.   HYDROCHLOROTHIAZIDE (HYDRODIURIL) 25 MG TABLET    Take 25 mg by mouth daily. Reported on 04/30/2016   HYDROCODONE-ACETAMINOPHEN (NORCO/VICODIN) 5-325 MG TABLET    Take 1 tablet by mouth every 4 (four) hours as needed for moderate pain.   IBUPROFEN (ADVIL,MOTRIN) 600 MG TABLET    Take 1 tablet (600 mg total) by mouth every 6 (six) hours as needed.   LISINOPRIL (PRINIVIL,ZESTRIL) 20 MG TABLET    Take 20 mg by mouth daily.    LORAZEPAM (ATIVAN) 1 MG TABLET    Take 1 tablet (1 mg total) by mouth 2 (two) times daily as needed for anxiety.   METOPROLOL (LOPRESSOR) 50 MG TABLET    Take 50 mg by mouth 2 (two) times daily. Reported on 04/30/2016   NAPROXEN (EC NAPROSYN) 500 MG EC TABLET    Take 1 tablet (500 mg total) by mouth 2 (two) times daily with a meal.   NORTRIPTYLINE (PAMELOR) 25 MG CAPSULE    Take 25 mg by  mouth at bedtime. Reported on 04/30/2016   OXYCODONE-ACETAMINOPHEN (ROXICET) 5-325 MG TABLET    Take 1 tablet by mouth every 4 (four) hours as needed for severe pain.    Review of Systems  Social History  Substance Use Topics  . Smoking status: Former Research scientist (life sciences)  . Smokeless tobacco: Not on file  . Alcohol Use: Yes     Comment: last drink March 11   Objective:   BP 145/90 mmHg  Pulse 84  Temp(Src) 99.1 F (37.3 C)  Resp 16  Ht 6\' 1"  (1.854 m)  Wt 187 lb (84.823 kg)  BMI 24.68 kg/m2  SpO2 98%  Physical Exam      Assessment & Plan:     1. Decreased GFR  -blood work nml  -RTC in one week  2. Anxiety  -reassured   3. Gout  -continue colcrys  4. Low potassium  -continue K-dur 10 mEq  -recheck levels    CMP and uric acid on Tuesday  [place]   [facility]

## 2016-05-02 ENCOUNTER — Emergency Department
Admission: EM | Admit: 2016-05-02 | Discharge: 2016-05-02 | Disposition: A | Discharge status: Home / Self Care | Payer: Self-pay | Attending: Emergency Medicine | Admitting: Emergency Medicine

## 2016-05-02 ENCOUNTER — Encounter: Payer: Self-pay | Admitting: Emergency Medicine

## 2016-05-02 DIAGNOSIS — I10 Essential (primary) hypertension: Secondary | ICD-10-CM | POA: Insufficient documentation

## 2016-05-02 DIAGNOSIS — J45909 Unspecified asthma, uncomplicated: Secondary | ICD-10-CM | POA: Insufficient documentation

## 2016-05-02 DIAGNOSIS — M1000 Idiopathic gout, unspecified site: Secondary | ICD-10-CM | POA: Insufficient documentation

## 2016-05-02 DIAGNOSIS — Z79899 Other long term (current) drug therapy: Secondary | ICD-10-CM | POA: Insufficient documentation

## 2016-05-02 DIAGNOSIS — Z87891 Personal history of nicotine dependence: Secondary | ICD-10-CM | POA: Insufficient documentation

## 2016-05-02 DIAGNOSIS — M109 Gout, unspecified: Secondary | ICD-10-CM

## 2016-05-02 LAB — CBC WITH DIFFERENTIAL/PLATELET
Band Neutrophils: 2 %
Basophils Absolute: 0 10*3/uL (ref 0–0.1)
Basophils Relative: 0 %
Blasts: 0 %
Eosinophils Absolute: 0.1 10*3/uL (ref 0–0.7)
Eosinophils Relative: 1 %
HCT: 39.5 % — ABNORMAL LOW (ref 40.0–52.0)
Hemoglobin: 13 g/dL (ref 13.0–18.0)
Lymphocytes Relative: 21 %
Lymphs Abs: 1.6 10*3/uL (ref 1.0–3.6)
MCH: 31.4 pg (ref 26.0–34.0)
MCHC: 32.8 g/dL (ref 32.0–36.0)
MCV: 95.7 fL (ref 80.0–100.0)
Metamyelocytes Relative: 0 %
Monocytes Absolute: 1.6 10*3/uL — ABNORMAL HIGH (ref 0.2–1.0)
Monocytes Relative: 22 %
Myelocytes: 0 %
Neutro Abs: 4.1 10*3/uL (ref 1.4–6.5)
Neutrophils Relative %: 54 %
Other: 0 %
Platelets: 257 10*3/uL (ref 150–440)
Promyelocytes Absolute: 0 %
RBC: 4.13 MIL/uL — ABNORMAL LOW (ref 4.40–5.90)
RDW: 17 % — ABNORMAL HIGH (ref 11.5–14.5)
WBC: 7.4 10*3/uL (ref 3.8–10.6)
nRBC: 0 /100 WBC

## 2016-05-02 LAB — COMPREHENSIVE METABOLIC PANEL
ALT: 12 U/L — ABNORMAL LOW (ref 17–63)
AST: 13 U/L — ABNORMAL LOW (ref 15–41)
Albumin: 3.8 g/dL (ref 3.5–5.0)
Alkaline Phosphatase: 68 U/L (ref 38–126)
Anion gap: 10 (ref 5–15)
BUN: 11 mg/dL (ref 6–20)
CO2: 25 mmol/L (ref 22–32)
Calcium: 9.4 mg/dL (ref 8.9–10.3)
Chloride: 102 mmol/L (ref 101–111)
Creatinine, Ser: 0.99 mg/dL (ref 0.61–1.24)
GFR calc Af Amer: >60 mL/min (ref >60)
GFR calc non Af Amer: >60 mL/min (ref >60)
Glucose, Bld: 122 mg/dL — ABNORMAL HIGH (ref 65–99)
Potassium: 3.4 mmol/L — ABNORMAL LOW (ref 3.5–5.1)
Sodium: 137 mmol/L (ref 135–145)
Total Bilirubin: 1 mg/dL (ref 0.3–1.2)
Total Protein: 7.2 g/dL (ref 6.5–8.1)

## 2016-05-02 MED ORDER — PREDNISONE 20 MG PO TABS: 60.0000 mg | ORAL_TABLET | Freq: Every day | ORAL | Status: DC

## 2016-05-02 MED ORDER — HYDROCODONE-ACETAMINOPHEN 5-325 MG PO TABS: 1.0000 | ORAL_TABLET | ORAL | Status: DC | PRN

## 2016-05-02 NOTE — ED Notes (Signed)
States has been seen here previously with diagnosis of gout, pain increased. Also had low potassium at that time. Has been taking supplement.

## 2016-05-02 NOTE — ED Provider Notes (Signed)
[facility] Emergency Department Provider Note  ____________________________________________  Time seen: Approximately 6:08 PM  I have reviewed the triage vital signs and the nursing notes.   HISTORY  Chief Complaint Joint Pain and Abnormal Lab    HPI Miguel [name] is a 55 y.o. male with a history of multiple gout flareswho presents for pain  In multiple joints consistent with his prior  Flareups. He reports that he was seen in the emergency department three days ago for pain that was primarily on the lateral side of his left foot, but that other major joints such as his right knee and left elbow were also involved.  He had a normal uric acid level  And had been taking colchicine for an acute flare rather than the allopurinol he takes as prophylaxis.  He was also noted to have low potassium and was started on a supplement. He comes in today complaining of worsening pain and multiple joints, this time primarily in his left knee and his right elbow. He states that his foot feels better but the pain has migrated to other joints it is unbearable, a severe burning and stabbing sensation that is sharp and worse with movement and weight-bearing. He reports that the joints are warm to the touch though they are not particularly red. He denies fever/chills, chest pain, shortness of breath, nausea, vomiting, diarrhea.  [facility]  He also reports recent issues with dental surgery and multiple visits to the dentist.   Past Medical History  Diagnosis Date  . Hypertension   . Gout   . Renal colic   . Asthma   . GERD (gastroesophageal reflux disease)     Patient Active Problem List   Diagnosis Date Noted  . Alcohol abuse 02/05/2016  . Hypertension 12/05/2015  . Tachycardia 12/05/2015  . Gout 11/13/2015  . Chronic back pain 05/02/2015    Past Surgical History  Procedure Laterality Date  . Cholecystectomy  2012      Current Outpatient Rx  Name  Route  Sig  Dispense  Refill  . albuterol (VENTOLIN HFA) 108 (90 Base) MCG/ACT inhaler   Inhalation   Inhale 2 puffs into the lungs every 4 (four) hours as needed for wheezing or shortness of breath.         . allopurinol (ZYLOPRIM) 100 MG tablet   Oral   Take 100 mg by mouth daily.         . colchicine 0.6 MG tablet      1 tablet once or twice a day with food   40 tablet   2   . Dexlansoprazole 30 MG capsule   Oral   Take 30 mg by mouth daily.         Marland Kitchen esomeprazole (NEXIUM) 40 MG capsule   Oral   Take 40 mg by mouth daily at 12 noon. Reported on 04/30/2016         . Fluticasone-Salmeterol (ADVAIR) 100-50 MCG/DOSE AEPB   Inhalation   Inhale 1 puff into the lungs 2 (two) times daily.         Marland Kitchen gabapentin (NEURONTIN) 300 MG capsule   Oral   Take 300 mg by mouth at bedtime.         . hydrochlorothiazide (HYDRODIURIL) 25 MG tablet   Oral   Take 25 mg by mouth daily. Reported on 04/30/2016         . HYDROcodone-acetaminophen (NORCO/VICODIN) 5-325  MG tablet   Oral   Take 1-2 tablets by mouth every 4 (four) hours as needed for moderate pain.   15 tablet   0   . ibuprofen (ADVIL,MOTRIN) 600 MG tablet   Oral   Take 1 tablet (600 mg total) by mouth every 6 (six) hours as needed. Patient not taking: Reported on 04/30/2016   30 tablet   0   . lisinopril (PRINIVIL,ZESTRIL) 20 MG tablet   Oral   Take 20 mg by mouth daily.          Marland Kitchen LORazepam (ATIVAN) 1 MG tablet   Oral   Take 1 tablet (1 mg total) by mouth 2 (two) times daily as needed for anxiety. Patient not taking: Reported on 04/30/2016   10 tablet   0   . metoprolol (LOPRESSOR) 50 MG tablet   Oral   Take 50 mg by mouth 2 (two) times daily. Reported on 04/30/2016         . naproxen (EC NAPROSYN) 500 MG EC tablet   Oral   Take 1 tablet (500 mg total) by mouth 2 (two) times daily with a meal. Patient not taking: Reported on 04/30/2016   30 tablet   0   .  nortriptyline (PAMELOR) 25 MG capsule   Oral   Take 25 mg by mouth at bedtime. Reported on 04/30/2016         . potassium chloride (K-DUR) 10 MEQ tablet   Oral   Take 1 tablet (10 mEq total) by mouth daily.   90 tablet   0   . predniSONE (DELTASONE) 20 MG tablet   Oral   Take 3 tablets (60 mg total) by mouth daily.   15 tablet   0     Allergies Review of patient's allergies indicates no known allergies.  No family history on file.  Social History Social History  Substance Use Topics  . Smoking status: Former Research scientist (life sciences)  . Smokeless tobacco: None  . Alcohol Use: Yes     Comment: last drink March 11    Review of Systems Constitutional: No fever/chills Eyes: No visual changes. ENT: No sore throat. Cardiovascular: Denies chest pain. Respiratory: Denies shortness of breath. Gastrointestinal: No abdominal pain.  No nausea, no vomiting.  No diarrhea.  No constipation. Genitourinary: Negative for dysuria. Musculoskeletal: Pain and swelling in multiple major joints Skin: Negative for rash. Neurological: Negative for headaches, focal weakness or numbness.  10-point ROS otherwise negative.  ____________________________________________   PHYSICAL EXAM:  VITAL SIGNS: ED Triage Vitals  Enc Vitals Group     BP 05/02/16 1424 120/76 mmHg     Pulse Rate 05/02/16 1424 105     Resp 05/02/16 1424 20     Temp 05/02/16 1424 98.6 F (37 C)     Temp Source 05/02/16 1424 Oral     SpO2 05/02/16 1424 100 %     Weight 05/02/16 1424 178 lb (80.74 kg)     Height 05/02/16 1424 6' (1.829 m)     Head Cir --      Peak Flow --      Pain Score 05/02/16 1428 9     Pain Loc --      Pain Edu? --      Excl. in [place]? --     Constitutional: Alert and oriented. Well appearing and in no acute distress. Eyes: Conjunctivae are normal. PERRL. EOMI. Head: Atraumatic. Nose: No congestion/rhinnorhea. Mouth/Throat: Mucous membranes are moist.  Oropharynx non-erythematous. Neck: No stridor.  No  meningeal signs.   Cardiovascular: Normal rate, regular rhythm. Good peripheral circulation. Grossly normal heart sounds.   Respiratory: Normal respiratory effort.  No retractions. Lungs CTAB. Gastrointestinal: Soft and nontender. No distention.  Musculoskeletal: the patient has normal appearing feeds with no evidence of podagra and no tenderness to palpation.  His left knee is swollen anteriorly and slightly warm to the touch but with no erythema. There is no  Popliteal tenderness. There is no evidence of cellulitis. He also has  Swelling to the posterior right elbow with no erythema but that is warm to the touch and tender which could be consistent either with bursitis or a gout flare. Neurologic:  Normal speech and language. No gross focal neurologic deficits are appreciated.  Skin:  Skin is warm, dry and intact. No rash noted. Psychiatric: Mood and affect are normal. Speech and behavior are normal.  ____________________________________________   LABS (all labs ordered are listed, but only abnormal results are displayed)  Labs Reviewed  CBC WITH DIFFERENTIAL/PLATELET - Abnormal; Notable for the following:    RBC 4.13 (*)    HCT 39.5 (*)    RDW 17.0 (*)    Monocytes Absolute 1.6 (*)    All other components within normal limits  COMPREHENSIVE METABOLIC PANEL - Abnormal; Notable for the following:    Potassium 3.4 (*)    Glucose, Bld 122 (*)    AST 13 (*)    ALT 12 (*)    All other components within normal limits   ____________________________________________  EKG  None ____________________________________________  RADIOLOGY   No results found.  ____________________________________________   PROCEDURES  Procedure(s) performed:   Procedures   ____________________________________________   INITIAL IMPRESSION / ASSESSMENT AND PLAN / ED COURSE  Pertinent labs & imaging results that were available during my care of the patient were reviewed by me and considered in my  medical decision making (see chart for details).  The patient is already taking colchicine. He has polyarticular pain and it is extremely unlikely that he has any sort of  Infection/septic joint given the migration pattern, the history of similar episodes, and normal vital signs. His blood work is also unremarkable with no leukocytosis. I looked him up in the [place] controlled substances database  And he has  Quite a few narcotics prescriptions filled over the last six months, but most of them are from his dentist and he did disclose dental surgery. He has a history of anxiety and chronic pain  And frankly I am concerned about narcotic seeking behavior, but he does  Have an abnormal physical exam that would suggest a gout or pseudogout (given normal uric acid level several days ago) flare that is refractory to colchicine so far.  As a result, I will give him a short burst of steroids, encourage continued use of colchicine,  And provide a small number of Norco.  I offered  Our thoracentesis of his left knee but he declined and I think that is appropriate given the incredibly low likelihood that it will change management  And the very low risk of this being a septic joint. I gave my usual and customary return precautions.  ____________________________________________  FINAL CLINICAL IMPRESSION(S) / ED DIAGNOSES  Final diagnoses:  Polyarticular gout     MEDICATIONS GIVEN DURING THIS VISIT:  Medications - No data to display   NEW OUTPATIENT MEDICATIONS STARTED DURING THIS VISIT:  Discharge Medication List as of 05/02/2016  6:34 PM    START taking these medications  Details  predniSONE (DELTASONE) 20 MG tablet Take 3 tablets (60 mg total) by mouth daily., Starting 05/02/2016, Until Discontinued, Print          Note:  This document was prepared using Dragon voice recognition software and may include unintentional dictation errors.   Hinda Kehr, MD 05/02/16 [phone]

## 2016-05-02 NOTE — Discharge Instructions (Signed)
As we discussed, you most likely are suffering from what is known as polyarticular gout.  Please continue taking your colchicine and in addition we gave you a prescription for prednisone which may help with the inflammation and pain.  Please follow-up at the next available opportunity with your regular doctor.    Take Norco as prescribed for severe pain. Do not drink alcohol, drive or participate in any other potentially dangerous activities while taking this medication as it may make you sleepy. Do not take this medication with any other sedating medications, either prescription or over-the-counter. If you were prescribed Percocet or Vicodin, do not take these with acetaminophen (Tylenol) as it is already contained within these medications.   This medication is an opiate (or narcotic) pain medication and can be habit forming.  Use it as little as possible to achieve adequate pain control.  Do not use or use it with extreme caution if you have a history of opiate abuse or dependence.  If you are on a pain contract with your primary care doctor or a pain specialist, be sure to let them know you were prescribed this medication today from the [facility] Emergency Department.  This medication is intended for your use only - do not give any to anyone else and keep it in a secure place where nobody else, especially children, have access to it.  It will also cause or worsen constipation, so you may want to consider taking an over-the-counter stool softener while you are taking this medication.  Please note that you have had multiple narcotic pain prescriptions given to you by multiple doctors over the last few months, primarily from your dentist, but from multiple other providers as well.  If you continue to suffer from chronic pain from any of a variety of sources, you should consider following up with a pain management center.   Gout Gout is an inflammatory arthritis caused by a buildup of uric acid  crystals in the joints. Uric acid is a chemical that is normally present in the blood. When the level of uric acid in the blood is too high it can form crystals that deposit in your joints and tissues. This causes joint redness, soreness, and swelling (inflammation). Repeat attacks are common. Over time, uric acid crystals can form into masses (tophi) near a joint, destroying bone and causing disfigurement. Gout is treatable and often preventable. CAUSES  The disease begins with elevated levels of uric acid in the blood. Uric acid is produced by your body when it breaks down a naturally found substance called purines. Certain foods you eat, such as meats and fish, contain high amounts of purines. Causes of an elevated uric acid level include:  Being passed down from parent to child (heredity).  Diseases that cause increased uric acid production (such as obesity, psoriasis, and certain cancers).  Excessive alcohol use.  Diet, especially diets rich in meat and seafood.  Medicines, including certain cancer-fighting medicines (chemotherapy), water pills (diuretics), and aspirin.  Chronic kidney disease. The kidneys are no longer able to remove uric acid well.  Problems with metabolism. Conditions strongly associated with gout include:  Obesity.  High blood pressure.  High cholesterol.  Diabetes. Not everyone with elevated uric acid levels gets gout. It is not understood why some people get gout and others do not. Surgery, joint injury, and eating too much of certain foods are some of the factors that can lead to gout attacks. SYMPTOMS   An attack of gout comes  on quickly. It causes intense pain with redness, swelling, and warmth in a joint.  Fever can occur.  Often, only one joint is involved. Certain joints are more commonly involved:  Base of the big toe.  Knee.  Ankle.  Wrist.  Finger. Without treatment, an attack usually goes away in a few days to weeks. Between attacks,  you usually will not have symptoms, which is different from many other forms of arthritis. DIAGNOSIS  Your caregiver will suspect gout based on your symptoms and exam. In some cases, tests may be recommended. The tests may include:  Blood tests.  Urine tests.  X-rays.  Joint fluid exam. This exam requires a needle to remove fluid from the joint (arthrocentesis). Using a microscope, gout is confirmed when uric acid crystals are seen in the joint fluid. TREATMENT  There are two phases to gout treatment: treating the sudden onset (acute) attack and preventing attacks (prophylaxis).  Treatment of an Acute Attack.  Medicines are used. These include anti-inflammatory medicines or steroid medicines.  An injection of steroid medicine into the affected joint is sometimes necessary.  The painful joint is rested. Movement can worsen the arthritis.  You may use warm or cold treatments on painful joints, depending which works best for you.  Treatment to Prevent Attacks.  If you suffer from frequent gout attacks, your caregiver may advise preventive medicine. These medicines are started after the acute attack subsides. These medicines either help your kidneys eliminate uric acid from your body or decrease your uric acid production. You may need to stay on these medicines for a very long time.  The early phase of treatment with preventive medicine can be associated with an increase in acute gout attacks. For this reason, during the first few months of treatment, your caregiver may also advise you to take medicines usually used for acute gout treatment. Be sure you understand your caregiver's directions. Your caregiver may make several adjustments to your medicine dose before these medicines are effective.  Discuss dietary treatment with your caregiver or dietitian. Alcohol and drinks high in sugar and fructose and foods such as meat, poultry, and seafood can increase uric acid levels. Your caregiver  or dietitian can advise you on drinks and foods that should be limited. HOME CARE INSTRUCTIONS   Do not take aspirin to relieve pain. This raises uric acid levels.  Only take over-the-counter or prescription medicines for pain, discomfort, or fever as directed by your caregiver.  Rest the joint as much as possible. When in bed, keep sheets and blankets off painful areas.  Keep the affected joint raised (elevated).  Apply warm or cold treatments to painful joints. Use of warm or cold treatments depends on which works best for you.  Use crutches if the painful joint is in your leg.  Drink enough fluids to keep your urine clear or pale yellow. This helps your body get rid of uric acid. Limit alcohol, sugary drinks, and fructose drinks.  Follow your dietary instructions. Pay careful attention to the amount of protein you eat. Your daily diet should emphasize fruits, vegetables, whole grains, and fat-free or low-fat milk products. Discuss the use of coffee, vitamin C, and cherries with your caregiver or dietitian. These may be helpful in lowering uric acid levels.  Maintain a healthy body weight. SEEK MEDICAL CARE IF:   You develop diarrhea, vomiting, or any side effects from medicines.  You do not feel better in 24 hours, or you are getting worse. [place]  CARE IF:   Your joint becomes suddenly more tender, and you have chills or a fever. MAKE SURE YOU:   Understand these instructions.  Will watch your condition.  Will get help right away if you are not doing well or get worse.   This information is not intended to replace advice given to you by your health care provider. Make sure you discuss any questions you have with your health care provider.   Document Released: 10/30/2000 Document Revised: 11/23/2014 Document Reviewed: 06/15/2012 Elsevier Interactive Patient Education 2016 [employer] are compounds that affect the level of uric  acid in your body. A low-purine diet is a diet that is low in purines. Eating a low-purine diet can prevent the level of uric acid in your body from getting too high and causing gout or kidney stones or both. WHAT DO I NEED TO KNOW ABOUT THIS DIET?  Choose low-purine foods. Examples of low-purine foods are listed in the next section.  Drink plenty of fluids, especially water. Fluids can help remove uric acid from your body. Try to drink 8-16 cups (1.9-3.8 L) a day.  Limit foods high in fat, especially saturated fat, as fat makes it harder for the body to get rid of uric acid. Foods high in saturated fat include pizza, cheese, ice cream, whole milk, fried foods, and gravies. Choose foods that are lower in fat and lean sources of protein. Use olive oil when cooking as it contains healthy fats that are not high in saturated fat.  Limit alcohol. Alcohol interferes with the elimination of uric acid from your body. If you are having a gout attack, avoid all alcohol.  Keep in mind that different people's bodies react differently to different foods. You will probably learn over time which foods do or do not affect you. If you discover that a food tends to cause your gout to flare up, avoid eating that food. You can more freely enjoy foods that do not cause problems. If you have any questions about a food item, talk to your dietitian or health care provider. WHICH FOODS ARE LOW, MODERATE, AND HIGH IN PURINES? The following is a list of foods that are low, moderate, and high in purines. You can eat any amount of the foods that are low in purines. You may be able to have small amounts of foods that are moderate in purines. Ask your health care provider how much of a food moderate in purines you can have. Avoid foods high in purines. Grains  Foods low in purines: Enriched white bread, pasta, rice, cake, cornbread, popcorn.  Foods moderate in purines: Whole-grain breads and cereals, wheat germ, bran, oatmeal.  Uncooked oatmeal. Dry wheat bran or wheat germ.  Foods high in purines: Pancakes, [place] toast, biscuits, muffins. Vegetables  Foods low in purines: All vegetables, except those that are moderate in purines.  Foods moderate in purines: Asparagus, cauliflower, spinach, mushrooms, green peas. Fruits  All fruits are low in purines. Meats and other Protein Foods  Foods low in purines: Eggs, nuts, peanut butter.  Foods moderate in purines: 80-90% lean beef, lamb, veal, pork, poultry, fish, eggs, peanut butter, nuts. Crab, lobster, oysters, and shrimp. Cooked dried beans, peas, and lentils.  Foods high in purines: Anchovies, sardines, herring, mussels, tuna, codfish, scallops, trout, and haddock. Berniece Salines. Organ meats (such as liver or kidney). Tripe. Game meat. Goose. Sweetbreads. Dairy  All dairy foods are low in purines. Low-fat and fat-free dairy products are best  because they are low in saturated fat. Beverages  Drinks low in purines: Water, carbonated beverages, tea, coffee, cocoa.  Drinks moderate in purines: Soft drinks and other drinks sweetened with high-fructose corn syrup. Juices. To find whether a food or drink is sweetened with high-fructose corn syrup, look at the ingredients list.  Drinks high in purines: Alcoholic beverages (such as beer). Condiments  Foods low in purines: Salt, herbs, olives, pickles, relishes, vinegar.  Foods moderate in purines: Butter, margarine, oils, mayonnaise. Fats and Oils  Foods low in purines: All types, except gravies and sauces made with meat.  Foods high in purines: Gravies and sauces made with meat. Other Foods  Foods low in purines: Sugars, sweets, gelatin. Cake. Soups made without meat.  Foods moderate in purines: Meat-based or fish-based soups, broths, or bouillons. Foods and drinks sweetened with high-fructose corn syrup.  Foods high in purines: High-fat desserts (such as ice cream, cookies, cakes, pies, doughnuts, and  chocolate). Contact your dietitian for more information on foods that are not listed here.   This information is not intended to replace advice given to you by your health care provider. Make sure you discuss any questions you have with your health care provider.   Document Released: 02/27/2011 Document Revised: 11/07/2013 Document Reviewed: 10/09/2013 Elsevier Interactive Patient Education [employer].

## 2016-05-05 ENCOUNTER — Other Ambulatory Visit: Payer: Self-pay

## 2016-05-05 NOTE — Addendum Note (Signed)
Addended by: Sanjuana Kava on: 05/05/2016 03:46 PM   Modules accepted: Orders, SmartSet

## 2016-05-06 ENCOUNTER — Ambulatory Visit: Payer: Self-pay

## 2016-05-06 DIAGNOSIS — R944 Abnormal results of kidney function studies: Secondary | ICD-10-CM

## 2016-05-06 DIAGNOSIS — M109 Gout, unspecified: Secondary | ICD-10-CM

## 2016-05-07 ENCOUNTER — Ambulatory Visit: Payer: Self-pay | Admitting: Nurse Practitioner

## 2016-05-07 VITALS — BP 166/92 | HR 77 | Ht 73.0 in | Wt 185.0 lb

## 2016-05-07 DIAGNOSIS — E876 Hypokalemia: Secondary | ICD-10-CM

## 2016-05-07 DIAGNOSIS — M2550 Pain in unspecified joint: Secondary | ICD-10-CM

## 2016-05-07 LAB — COMPREHENSIVE METABOLIC PANEL
ALT: 9 IU/L (ref 0–44)
AST: 6 IU/L (ref 0–40)
Albumin/Globulin Ratio: 1.4 (ref 1.2–2.2)
Albumin: 3.5 g/dL (ref 3.5–5.5)
Alkaline Phosphatase: 61 IU/L (ref 39–117)
BUN/Creatinine Ratio: 15 (ref 9–20)
BUN: 12 mg/dL (ref 6–24)
Bilirubin Total: 0.2 mg/dL (ref 0.0–1.2)
CO2: 22 mmol/L (ref 18–29)
Calcium: 9.5 mg/dL (ref 8.7–10.2)
Chloride: 103 mmol/L (ref 96–106)
Creatinine, Ser: 0.78 mg/dL (ref 0.76–1.27)
GFR calc Af Amer: 117 mL/min/{1.73_m2} (ref >59)
GFR calc non Af Amer: 101 mL/min/{1.73_m2} (ref >59)
Globulin, Total: 2.5 g/dL (ref 1.5–4.5)
Glucose: 112 mg/dL — ABNORMAL HIGH (ref 65–99)
Potassium: 3.8 mmol/L (ref 3.5–5.2)
Sodium: 141 mmol/L (ref 134–144)
Total Protein: 6 g/dL (ref 6.0–8.5)

## 2016-05-07 LAB — URIC ACID: Uric Acid: 5.9 mg/dL (ref 3.7–8.6)

## 2016-05-07 NOTE — Progress Notes (Signed)
   Subjective:    Patient ID: Miguel [name], male    DOB: [birth year], 55 y.o.   MRN: [id number]  HPI HAS RECENT EPISODE OF GOUT, ON CORTICOSTEROID, NOW FINISHED, DOING MUCH BETTER.    HAS COMPLETED ORDERED PREDNISONE AND POTASSIUM AND FEELS MUCH BETTER.  PREDNISONE HAS IMPROVED TO 3.8  HAS HAD SUTURES REMOVED FROM L LOWER JAW, AND MUCH LESS PAINFUL.    STOPPED ETOH USE IN MARCH, IS PRIMARY CAREGIVE FOR HIS DISABLED WIFE.     Review of Systems   JOINT PAIN RESOLVED TOOTH PAIN RESOLVED HYPOKALEMIA RESOLVED       Objective:   Physical Exam  ALERT VERBALLY APPROPRIATE   IN NO ACUTE DISTRESS.   HAVE PT RETURN IN ONE MONTH FOR VISIT WITH SHANNON, SHE WILL REEVAL WHEN NEXT LABS TO BE DRAWN.    SHE WILL ALSO EVAL JOINT PAIN.         Assessment & Plan:

## 2016-06-09 ENCOUNTER — Ambulatory Visit: Payer: Self-pay

## 2016-06-17 IMAGING — CR DG CHEST 1V
1 series · 1 of 1 positions shown · non-contrast
Comparison: Chest radiograph June 26, 2014; chest CT June 10, 2015

CLINICAL DATA: One day history of chest pain.  Shortness of breath.

EXAM:
CHEST 1 VIEW

[ap]
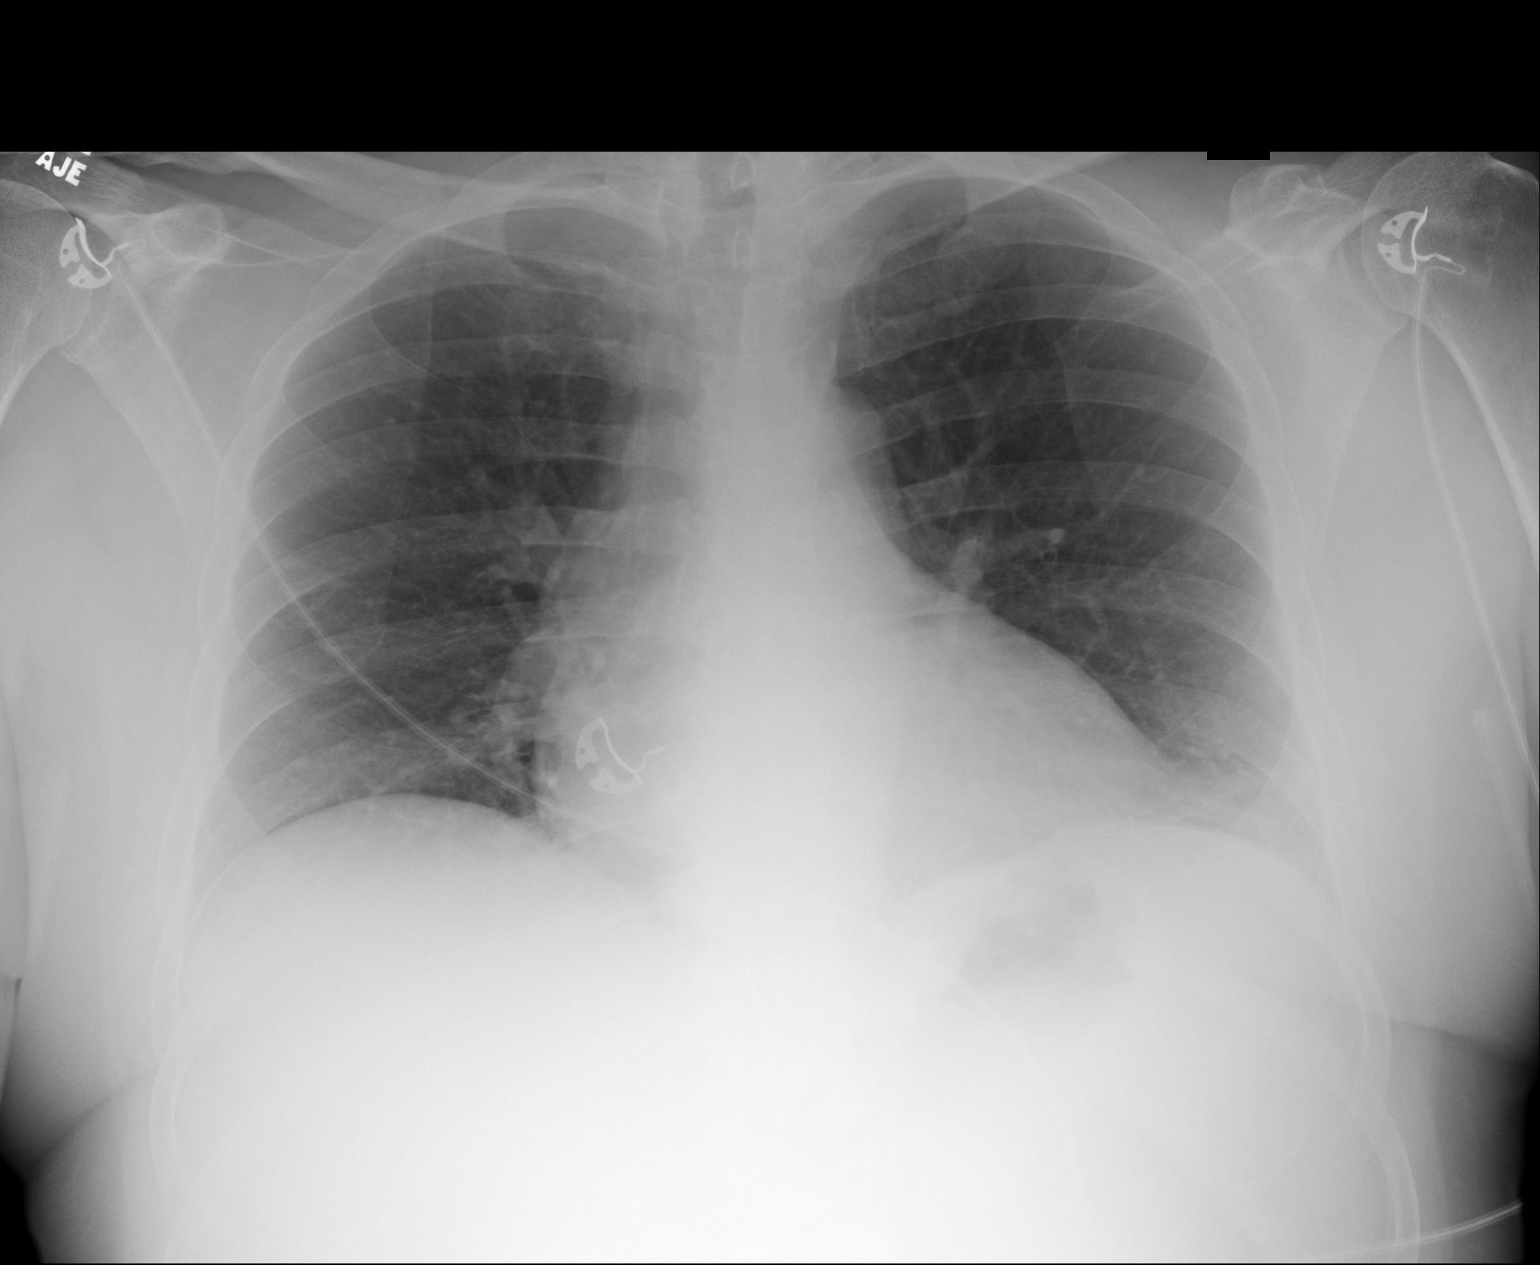

[1 of 1 positions shown; findings below may reference images not displayed]

FINDINGS: There is mild scarring in the left base. Lungs elsewhere clear.
Heart is upper normal in size with pulmonary vascularity within
normal limits. No adenopathy. No bone lesions. No pneumothorax.
IMPRESSION: Slight scarring left base.  No edema or consolidation.

## 2016-06-18 IMAGING — CR DG CHEST 2V
1 series · 2 of 2 positions shown · non-contrast
Comparison: 09/17/2015

CLINICAL DATA: Fell down 3 or 4 steps tonight, now with back and
abdominal pain. Dyspnea.

EXAM:
CHEST  2 VIEW

[Series 1: dg chest 2 view · 0.14mm/px · 2 of 2 slices shown]
[im 1/2]
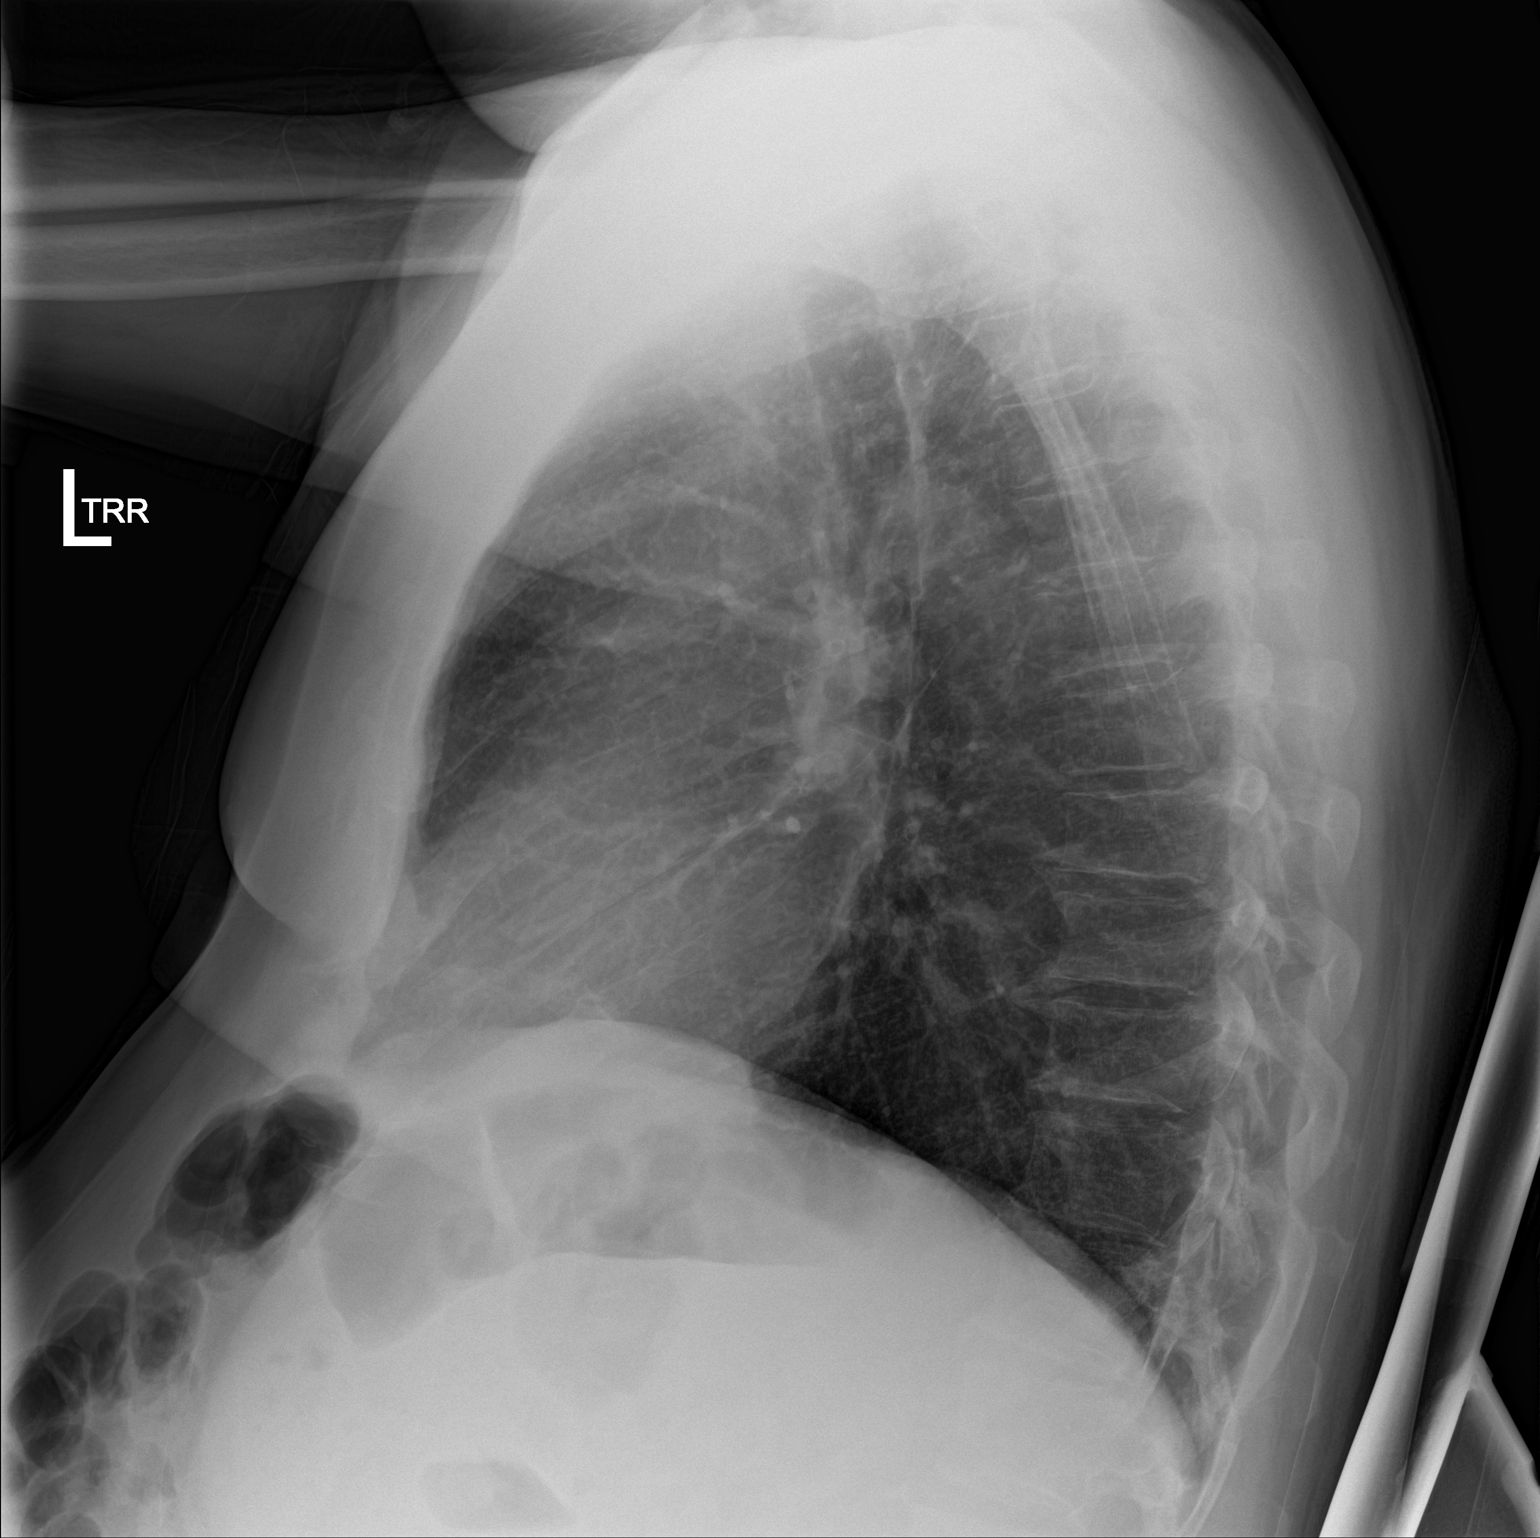
[im 2/2]
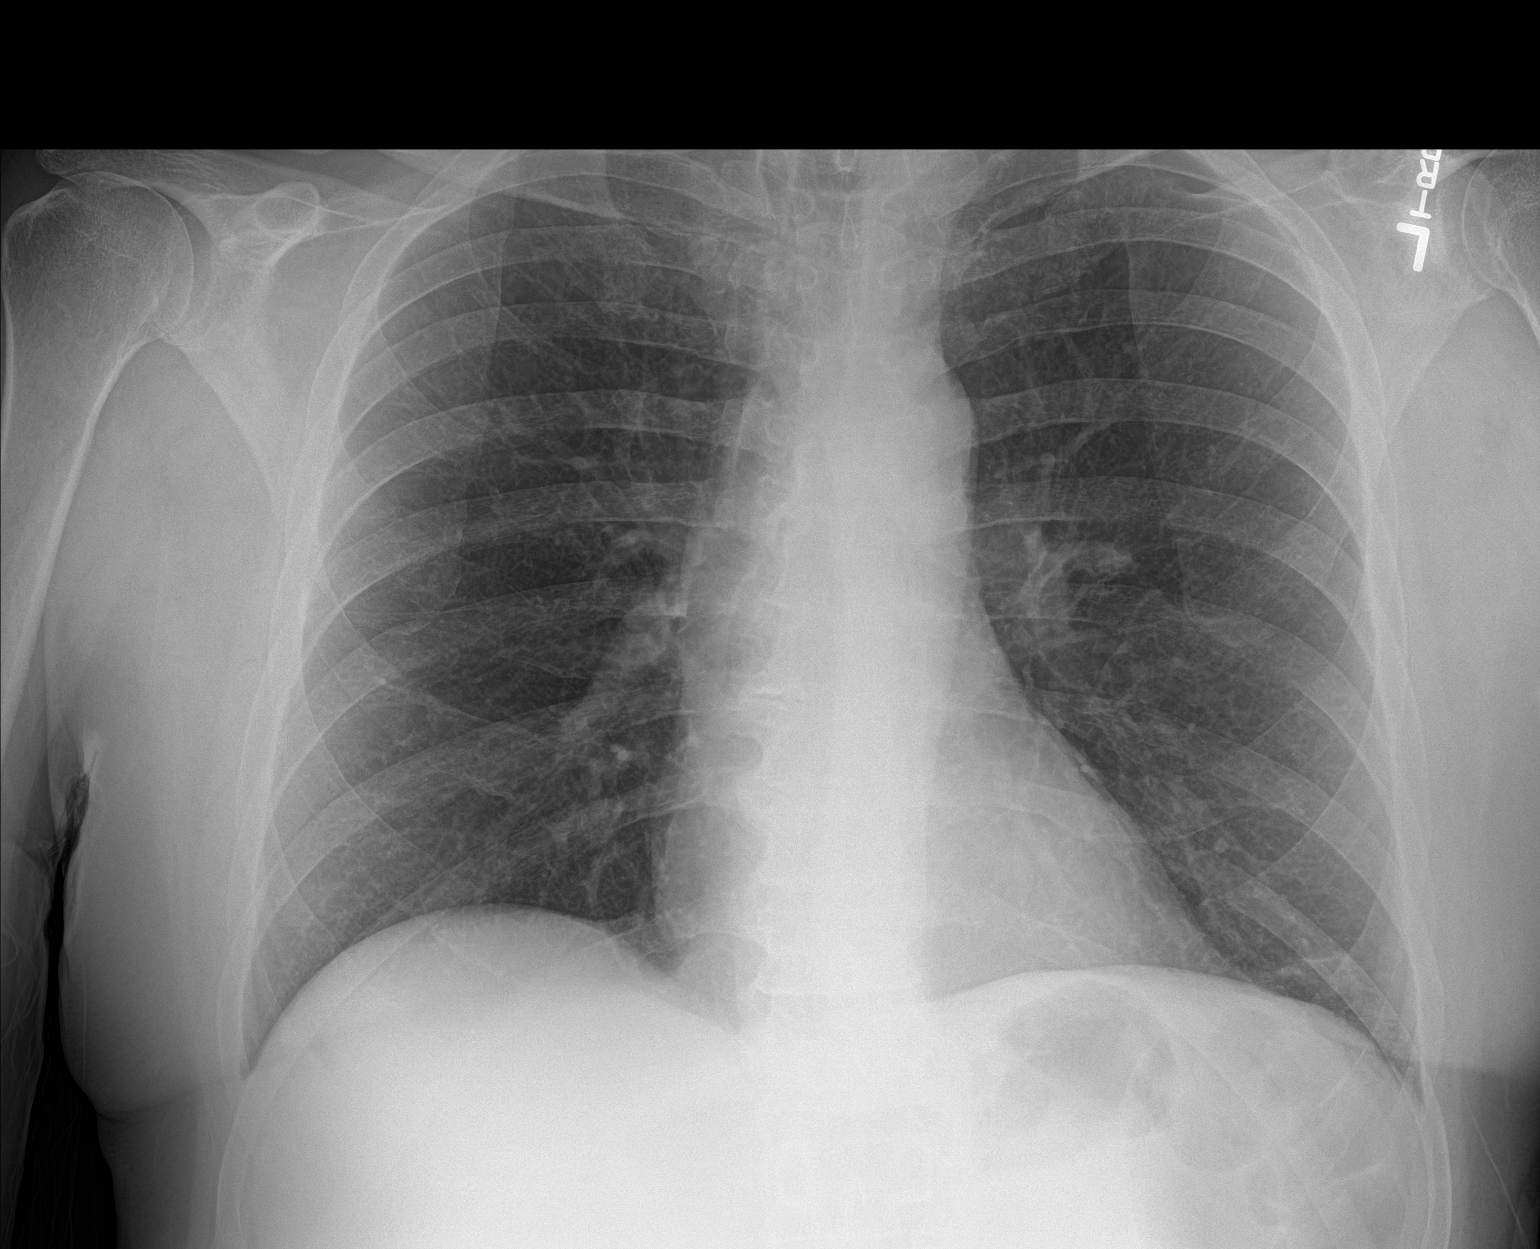

[2 of 2 positions shown; findings below may reference images not displayed]

FINDINGS: The lungs are clear except for mild linear scarring in the lateral
left base. Heart size is normal. Pulmonary vasculature is normal.
Hilar and mediastinal contours are unremarkable unchanged. There are
no pleural effusions.
IMPRESSION: No active cardiopulmonary disease.

## 2016-06-30 ENCOUNTER — Emergency Department
Admission: EM | Admit: 2016-06-30 | Discharge: 2016-06-30 | Disposition: A | Discharge status: Home / Self Care | Payer: Self-pay | Attending: Emergency Medicine | Admitting: Emergency Medicine

## 2016-06-30 ENCOUNTER — Encounter: Payer: Self-pay | Admitting: Emergency Medicine

## 2016-06-30 DIAGNOSIS — I10 Essential (primary) hypertension: Secondary | ICD-10-CM | POA: Insufficient documentation

## 2016-06-30 DIAGNOSIS — Z87891 Personal history of nicotine dependence: Secondary | ICD-10-CM | POA: Insufficient documentation

## 2016-06-30 DIAGNOSIS — F419 Anxiety disorder, unspecified: Secondary | ICD-10-CM | POA: Insufficient documentation

## 2016-06-30 DIAGNOSIS — F1010 Alcohol abuse, uncomplicated: Secondary | ICD-10-CM | POA: Insufficient documentation

## 2016-06-30 DIAGNOSIS — J45909 Unspecified asthma, uncomplicated: Secondary | ICD-10-CM | POA: Insufficient documentation

## 2016-06-30 MED ORDER — CHLORDIAZEPOXIDE HCL 10 MG PO CAPS: 10.0000 mg | ORAL_CAPSULE | Freq: Three times a day (TID) | ORAL | 0 refills | Status: DC | PRN

## 2016-06-30 NOTE — Discharge Instructions (Signed)
You were prescribed a medication that is potentially sedating. Do not drink alcohol, drive or participate in any other potentially dangerous activities while taking this medication as it may make you sleepy. Do not take this medication with any other sedating medications, either prescription or over-the-counter. If you were prescribed Percocet or Vicodin, do not take these with acetaminophen (Tylenol) as it is already contained within these medications.

## 2016-06-30 NOTE — ED Triage Notes (Signed)
Pt to ed with c/o increased anxiety.  Pt states he has been drinking alcohol lately again and would like to get meds "like I got the last time"  Reports it really helps him to stop drinking alcohol.  Pt states he is waiting for appt at [facility].

## 2016-06-30 NOTE — ED Provider Notes (Signed)
[facility] Emergency Department Provider Note  ____________________________________________  Time seen: Approximately 11:23 AM  I have reviewed the triage vital signs and the nursing notes.   HISTORY  Chief Complaint Anxiety    HPI Miguel [name] is a 55 y.o. male who complains of anxiousness for the past couple days. He reports that he has relapsed with his alcohol abuse and started drinking about a pint of liquor daily for the past 2 weeks. No tremulousness seizures or altered mental status. No confusion. He's been eating and drinking but less than he normally would. He does not have any dizziness. No chest pain shortness of breath or vomiting. No black or bloody stools. Request Librium as this helped him in the past. He brings his previous prescription bottle from March 2017 where he still has 2 out of 20 Librium left. Review of the controlled substance reported system also reveals that he obtained lorazepam in May for anxiety.  He reports that he is followed up with [facility] but that he was not present when they called his name and he missed his appointment. [facility] but also finds that difficult to manage.     Past Medical History:  Diagnosis Date  . Asthma   . GERD (gastroesophageal reflux disease)   . Gout   . Hypertension   . Renal colic      Patient Active Problem List   Diagnosis Date Noted  . Alcohol abuse 02/05/2016  . Hypertension 12/05/2015  . Tachycardia 12/05/2015  . Gout 11/13/2015  . Chronic back pain 05/02/2015     Past Surgical History:  Procedure Laterality Date  . CHOLECYSTECTOMY  2012     Prior to Admission medications   Medication Sig Start Date End Date Taking? Authorizing Provider  albuterol (VENTOLIN HFA) 108 (90 Base) MCG/ACT inhaler Inhale 2 puffs into the lungs every 4 (four) hours as needed for wheezing or shortness of breath.    Historical Provider, MD  allopurinol (ZYLOPRIM) 100  MG tablet Take 100 mg by mouth daily.    Historical Provider, MD  chlordiazePOXIDE (LIBRIUM) 10 MG capsule Take 1 capsule (10 mg total) by mouth 3 (three) times daily as needed for anxiety or withdrawal. 06/30/16   Carrie Mew, MD  colchicine 0.6 MG tablet 1 tablet once or twice a day with food 04/30/16 05/31/16  Larene Beach A McGowan, PA-C  Dexlansoprazole 30 MG capsule Take 30 mg by mouth daily.    Historical Provider, MD  esomeprazole (NEXIUM) 40 MG capsule Take 40 mg by mouth daily at 12 noon. Reported on 05/07/2016    Historical Provider, MD  Fluticasone-Salmeterol (ADVAIR) 100-50 MCG/DOSE AEPB Inhale 1 puff into the lungs 2 (two) times daily.    Historical Provider, MD  gabapentin (NEURONTIN) 300 MG capsule Take 300 mg by mouth at bedtime. Reported on 05/07/2016    Historical Provider, MD  hydrochlorothiazide (HYDRODIURIL) 25 MG tablet Take 25 mg by mouth daily. Reported on 05/07/2016    Historical Provider, MD  HYDROcodone-acetaminophen (NORCO/VICODIN) 5-325 MG tablet Take 1-2 tablets by mouth every 4 (four) hours as needed for moderate pain. Patient not taking: Reported on 05/07/2016 05/02/16   Hinda Kehr, MD  ibuprofen (ADVIL,MOTRIN) 600 MG tablet Take 1 tablet (600 mg total) by mouth every 6 (six) hours as needed. Patient not taking: Reported on 04/30/2016 12/31/15   Sable Feil, PA-C  lisinopril (PRINIVIL,ZESTRIL) 20 MG tablet Take 20 mg by mouth daily.  Historical Provider, MD  LORazepam (ATIVAN) 1 MG tablet Take 1 tablet (1 mg total) by mouth 2 (two) times daily as needed for anxiety. Patient not taking: Reported on 04/30/2016 04/01/16   Daymon Larsen, MD  metoprolol (LOPRESSOR) 50 MG tablet Take 50 mg by mouth 2 (two) times daily. Reported on 05/07/2016    Historical Provider, MD  naproxen (EC NAPROSYN) 500 MG EC tablet Take 1 tablet (500 mg total) by mouth 2 (two) times daily with a meal. Patient not taking: Reported on 04/30/2016 02/13/16   Cannon Kettle V Bacon Menshew, PA-C  nortriptyline  (PAMELOR) 25 MG capsule Take 25 mg by mouth at bedtime. Reported on 05/07/2016    Historical Provider, MD  potassium chloride (K-DUR) 10 MEQ tablet Take 1 tablet (10 mEq total) by mouth daily. Patient not taking: Reported on 05/07/2016 04/30/16   Larene Beach A McGowan, PA-C  predniSONE (DELTASONE) 20 MG tablet Take 3 tablets (60 mg total) by mouth daily. Patient not taking: Reported on 05/07/2016 05/02/16   Hinda Kehr, MD     Allergies Review of patient's allergies indicates no known allergies.   History reviewed. No pertinent family history.  Social History Social History  Substance Use Topics  . Smoking status: Former Research scientist (life sciences)  . Smokeless tobacco: Never Used  . Alcohol use Yes     Comment: last drink March 11    Review of Systems  Constitutional:   No fever or chills.  ENT:   No sore throat. No rhinorrhea. Cardiovascular:   No chest pain. Respiratory:   No dyspnea or cough. Gastrointestinal:   Negative for abdominal pain, vomiting and diarrhea.  Musculoskeletal:   Negative for focal pain or swelling Neurological:   Negative for headaches. Positive anxiety. 10-point ROS otherwise negative.  ____________________________________________   PHYSICAL EXAM:  VITAL SIGNS: ED Triage Vitals  Enc Vitals Group     BP 06/30/16 0943 (!) 140/91     Pulse Rate 06/30/16 0943 94     Resp 06/30/16 0943 20     Temp 06/30/16 0943 98 F (36.7 C)     Temp src --      SpO2 06/30/16 0943 100 %     Weight 06/30/16 0944 185 lb (83.9 kg)     Height 06/30/16 0944 6' (1.829 m)     Head Circumference --      Peak Flow --      Pain Score 06/30/16 0944 0     Pain Loc --      Pain Edu? --      Excl. in [place]? --     Vital signs reviewed, nursing assessments reviewed.   Constitutional:   Alert and oriented. Well appearing and in no distress. Eyes:   No scleral icterus. No conjunctival pallor. PERRL. EOMI.  No nystagmus. ENT   Head:   Normocephalic and atraumatic.   Nose:   No  congestion/rhinnorhea. No septal hematoma   Mouth/Throat:   MMM, no pharyngeal erythema. No peritonsillar mass.    Neck:   No stridor. No SubQ emphysema. No meningismus. Hematological/Lymphatic/Immunilogical:   No cervical lymphadenopathy. Cardiovascular:   RRR Heart rate 80. Symmetric bilateral radial and DP pulses.  No murmurs. No orthostatic symptoms or change in vital signs on exam Respiratory:   Normal respiratory effort without tachypnea nor retractions. Breath sounds are clear and equal bilaterally. No wheezes/rales/rhonchi. Gastrointestinal:   Soft and nontender. Non distended. There is no CVA tenderness.  No rebound, rigidity, or guarding. Genitourinary:   deferred Musculoskeletal:  Nontender with normal range of motion in all extremities. No joint effusions.  No lower extremity tenderness.  No edema. Neurologic:   Normal speech and language.  CN 2-10 normal. Motor grossly intact. No gross focal neurologic deficits are appreciated.  Skin:    Skin is warm, dry and intact. No rash noted.  No petechiae, purpura, or bullae.  ____________________________________________    LABS (pertinent positives/negatives) (all labs ordered are listed, but only abnormal results are displayed) Labs Reviewed - No data to display ____________________________________________   EKG    ____________________________________________    RADIOLOGY    ____________________________________________   PROCEDURES Procedures  ____________________________________________   INITIAL IMPRESSION / ASSESSMENT AND PLAN / ED COURSE  Pertinent labs & imaging results that were available during my care of the patient were reviewed by me and considered in my medical decision making (see chart for details).  Patient well appearing no acute distress. Presents requesting Librium for his anxiety and wishing to detox from alcohol at home. Does not want to be in a program. This is work for him in the past  although he has relapsed a few times. Encouraged him to follow up with [facility] as well as Alcoholics Anonymous. I'll prescribe him a very limited course of low-dose Librium. Follow-up with primary care.     Clinical Course   ____________________________________________   FINAL CLINICAL IMPRESSION(S) / ED DIAGNOSES  Final diagnoses:  Anxiety disorder, unspecified anxiety disorder type  Alcohol abuse       Portions of this note were generated with dragon dictation software. Dictation errors may occur despite best attempts at proofreading.    Carrie Mew, MD 06/30/16 1126

## 2016-07-22 ENCOUNTER — Emergency Department
Admission: EM | Admit: 2016-07-22 | Discharge: 2016-07-22 | Disposition: A | Discharge status: Home / Self Care | Payer: Self-pay | Attending: Emergency Medicine | Admitting: Emergency Medicine

## 2016-07-22 ENCOUNTER — Emergency Department: Payer: Self-pay

## 2016-07-22 DIAGNOSIS — W109XXA Fall (on) (from) unspecified stairs and steps, initial encounter: Secondary | ICD-10-CM | POA: Insufficient documentation

## 2016-07-22 DIAGNOSIS — S20212A Contusion of left front wall of thorax, initial encounter: Secondary | ICD-10-CM | POA: Insufficient documentation

## 2016-07-22 DIAGNOSIS — Z87891 Personal history of nicotine dependence: Secondary | ICD-10-CM | POA: Insufficient documentation

## 2016-07-22 DIAGNOSIS — J45909 Unspecified asthma, uncomplicated: Secondary | ICD-10-CM | POA: Insufficient documentation

## 2016-07-22 DIAGNOSIS — Y9301 Activity, walking, marching and hiking: Secondary | ICD-10-CM | POA: Insufficient documentation

## 2016-07-22 DIAGNOSIS — Y999 Unspecified external cause status: Secondary | ICD-10-CM | POA: Insufficient documentation

## 2016-07-22 DIAGNOSIS — I10 Essential (primary) hypertension: Secondary | ICD-10-CM | POA: Insufficient documentation

## 2016-07-22 DIAGNOSIS — Y929 Unspecified place or not applicable: Secondary | ICD-10-CM | POA: Insufficient documentation

## 2016-07-22 HISTORY — DX: Obsessive-compulsive disorder, unspecified: F42.9

## 2016-07-22 LAB — COMPREHENSIVE METABOLIC PANEL
ALT: 27 U/L (ref 17–63)
AST: 26 U/L (ref 15–41)
Albumin: 4.3 g/dL (ref 3.5–5.0)
Alkaline Phosphatase: 73 U/L (ref 38–126)
Anion gap: 6 (ref 5–15)
BUN: 18 mg/dL (ref 6–20)
CO2: 27 mmol/L (ref 22–32)
Calcium: 8.9 mg/dL (ref 8.9–10.3)
Chloride: 109 mmol/L (ref 101–111)
Creatinine, Ser: 1.08 mg/dL (ref 0.61–1.24)
GFR calc Af Amer: >60 mL/min (ref >60)
GFR calc non Af Amer: >60 mL/min (ref >60)
Glucose, Bld: 107 mg/dL — ABNORMAL HIGH (ref 65–99)
Potassium: 3.9 mmol/L (ref 3.5–5.1)
Sodium: 142 mmol/L (ref 135–145)
Total Bilirubin: 0.3 mg/dL (ref 0.3–1.2)
Total Protein: 7.1 g/dL (ref 6.5–8.1)

## 2016-07-22 LAB — CBC WITH DIFFERENTIAL/PLATELET
Basophils Absolute: 0 10*3/uL (ref 0–0.1)
Basophils Relative: 0 %
Eosinophils Absolute: 0.1 10*3/uL (ref 0–0.7)
Eosinophils Relative: 1 %
HCT: 43.4 % (ref 40.0–52.0)
Hemoglobin: 14.6 g/dL (ref 13.0–18.0)
Lymphocytes Relative: 19 %
Lymphs Abs: 2.3 10*3/uL (ref 1.0–3.6)
MCH: 29.9 pg (ref 26.0–34.0)
MCHC: 33.6 g/dL (ref 32.0–36.0)
MCV: 88.9 fL (ref 80.0–100.0)
Monocytes Absolute: 1 10*3/uL (ref 0.2–1.0)
Monocytes Relative: 8 %
Neutro Abs: 8.8 10*3/uL — ABNORMAL HIGH (ref 1.4–6.5)
Neutrophils Relative %: 72 %
Platelets: 200 10*3/uL (ref 150–440)
RBC: 4.89 MIL/uL (ref 4.40–5.90)
RDW: 18.3 % — ABNORMAL HIGH (ref 11.5–14.5)
WBC: 12.3 10*3/uL — ABNORMAL HIGH (ref 3.8–10.6)

## 2016-07-22 LAB — TROPONIN I: Troponin I: <0.03 ng/mL (ref <0.03)

## 2016-07-22 LAB — LIPASE, BLOOD: Lipase: 29 U/L (ref 11–51)

## 2016-07-22 MED ORDER — LIDOCAINE 5 % EX PTCH
MEDICATED_PATCH | CUTANEOUS | Status: AC
  Administered 2016-07-22: 1 via TRANSDERMAL
  Filled 2016-07-22: qty 1

## 2016-07-22 MED ORDER — LIDOCAINE 5 % EX PTCH
1.0000 | MEDICATED_PATCH | CUTANEOUS | Status: DC
  Administered 2016-07-22: 1 via TRANSDERMAL

## 2016-07-22 MED ORDER — LIDOCAINE 5 % EX PTCH: 1.0000 | MEDICATED_PATCH | Freq: Two times a day (BID) | CUTANEOUS | 0 refills | Status: DC

## 2016-07-22 MED ORDER — GI COCKTAIL ~~LOC~~
30.0000 mL | Freq: Once | ORAL | Status: AC
Start: 2016-07-22 — End: 2016-07-22
  Administered 2016-07-22: 30 mL via ORAL
  Filled 2016-07-22: qty 30

## 2016-07-22 NOTE — ED Notes (Addendum)
Redness to left rib, worsening pain with deep breaths. No obvious deformity. Pt reports that he fell down a few stairs while at home, EMS reports the same.

## 2016-07-22 NOTE — ED Triage Notes (Addendum)
Pt arrives to ER via [facility] from home c/o left rib pain after fall today. Pt grabbing at left rib.

## 2016-07-22 NOTE — Discharge Instructions (Signed)
Please seek medical attention for any high fevers, chest pain, shortness of breath, change in behavior, persistent vomiting, bloody stool or any other new or concerning symptoms.  

## 2016-07-22 NOTE — ED Provider Notes (Addendum)
[facility] Emergency Department Dody Smartt Note    ____________________________________________   I have reviewed the triage vital signs and the nursing notes.   HISTORY  Chief Complaint Fall   History limited by: Not Limited   HPI Miguel [name] is a 55 y.o. male who presents to the emergency department today because of left chest pain. The patient states that he was walking up a couple stairs when he fell. He is not sure if he hit his left chest against the railing or the step. Since that time he has been having severe pain to the left side of his chest. He states it is worse with deep breath. Patient denies pain anywhere else.   Past Medical History:  Diagnosis Date  . Asthma   . GERD (gastroesophageal reflux disease)   . Gout   . Hypertension   . OCD (obsessive compulsive disorder)   . Renal colic     Patient Active Problem List   Diagnosis Date Noted  . Alcohol abuse 02/05/2016  . Hypertension 12/05/2015  . Tachycardia 12/05/2015  . Gout 11/13/2015  . Chronic back pain 05/02/2015    Past Surgical History:  Procedure Laterality Date  . CHOLECYSTECTOMY  2012    Prior to Admission medications   Medication Sig Start Date End Date Taking? Authorizing Borden Thune  albuterol (VENTOLIN HFA) 108 (90 Base) MCG/ACT inhaler Inhale 2 puffs into the lungs every 4 (four) hours as needed for wheezing or shortness of breath.    Historical Crystel Demarco, MD  allopurinol (ZYLOPRIM) 100 MG tablet Take 100 mg by mouth daily.    Historical Bibiana Gillean, MD  chlordiazePOXIDE (LIBRIUM) 10 MG capsule Take 1 capsule (10 mg total) by mouth 3 (three) times daily as needed for anxiety or withdrawal. 06/30/16   Carrie Mew, MD  colchicine 0.6 MG tablet 1 tablet once or twice a day with food 04/30/16 05/31/16  Larene Beach A McGowan, PA-C  Dexlansoprazole 30 MG capsule Take 30 mg by mouth daily.    Historical Jamyah Folk, MD  esomeprazole (NEXIUM) 40 MG capsule Take 40 mg by mouth daily  at 12 noon. Reported on 05/07/2016    Historical Samatha Anspach, MD  Fluticasone-Salmeterol (ADVAIR) 100-50 MCG/DOSE AEPB Inhale 1 puff into the lungs 2 (two) times daily.    Historical Abisola Carrero, MD  gabapentin (NEURONTIN) 300 MG capsule Take 300 mg by mouth at bedtime. Reported on 05/07/2016    Historical Jaymen Fetch, MD  hydrochlorothiazide (HYDRODIURIL) 25 MG tablet Take 25 mg by mouth daily. Reported on 05/07/2016    Historical Trenee Igoe, MD  HYDROcodone-acetaminophen (NORCO/VICODIN) 5-325 MG tablet Take 1-2 tablets by mouth every 4 (four) hours as needed for moderate pain. Patient not taking: Reported on 05/07/2016 05/02/16   Hinda Kehr, MD  ibuprofen (ADVIL,MOTRIN) 600 MG tablet Take 1 tablet (600 mg total) by mouth every 6 (six) hours as needed. Patient not taking: Reported on 04/30/2016 12/31/15   Sable Feil, PA-C  lisinopril (PRINIVIL,ZESTRIL) 20 MG tablet Take 20 mg by mouth daily.     Historical Disha Cottam, MD  LORazepam (ATIVAN) 1 MG tablet Take 1 tablet (1 mg total) by mouth 2 (two) times daily as needed for anxiety. Patient not taking: Reported on 04/30/2016 04/01/16   Daymon Larsen, MD  metoprolol (LOPRESSOR) 50 MG tablet Take 50 mg by mouth 2 (two) times daily. Reported on 05/07/2016    Historical Milany Geck, MD  naproxen (EC NAPROSYN) 500 MG EC tablet Take 1 tablet (500 mg total) by mouth 2 (two) times daily  with a meal. Patient not taking: Reported on 04/30/2016 02/13/16   Alita Chyle Bacon Menshew, PA-C  nortriptyline (PAMELOR) 25 MG capsule Take 25 mg by mouth at bedtime. Reported on 05/07/2016    Historical Jadeyn Hargett, MD  potassium chloride (K-DUR) 10 MEQ tablet Take 1 tablet (10 mEq total) by mouth daily. Patient not taking: Reported on 05/07/2016 04/30/16   Larene Beach A McGowan, PA-C  predniSONE (DELTASONE) 20 MG tablet Take 3 tablets (60 mg total) by mouth daily. Patient not taking: Reported on 05/07/2016 05/02/16   Hinda Kehr, MD    Allergies Review of patient's allergies indicates no known  allergies.  No family history on file.  Social History Social History  Substance Use Topics  . Smoking status: Former Research scientist (life sciences)  . Smokeless tobacco: Never Used  . Alcohol use Yes    Review of Systems  Constitutional: Negative for fever. Cardiovascular: Negative for chest pain. Respiratory: Negative for shortness of breath. Gastrointestinal: Negative for abdominal pain, vomiting and diarrhea. Genitourinary: Negative for dysuria. Musculoskeletal: Negative for back pain.Positive for left chest pain Skin: Negative for rash. Neurological: Negative for headaches, focal weakness or numbness.   10-point ROS otherwise negative.  ____________________________________________   PHYSICAL EXAM:  VITAL SIGNS: ED Triage Vitals  Enc Vitals Group     BP 07/22/16 1654 112/75     Pulse Rate 07/22/16 1654 84     Resp 07/22/16 1654 20     Temp 07/22/16 1654 97.7 F (36.5 C)     Temp Source 07/22/16 1654 Oral     SpO2 07/22/16 1654 98 %     Weight 07/22/16 1655 185 lb (83.9 kg)     Height 07/22/16 1655 6' (1.829 m)   Constitutional: Awake and alert. Appears somewhat uncomfortable. Holding the left side of his chest. Eyes: Conjunctivae are normal. Normal extraocular movements. ENT   Head: Normocephalic and atraumatic.   Nose: No congestion/rhinnorhea.   Mouth/Throat: Mucous membranes are moist.   Neck: No stridor. Hematological/Lymphatic/Immunilogical: No cervical lymphadenopathy. Cardiovascular: Normal rate, regular rhythm.  No murmurs, rubs, or gallops. Respiratory: Normal respiratory effort without tachypnea nor retractions. Breath sounds are clear and equal bilaterally. No wheezes/rales/rhonchi. Gastrointestinal: Soft and nontender. No distention.  Genitourinary: Deferred Musculoskeletal: Normal range of motion in all extremities. No lower extremity edema. Neurologic:  Normal speech and language. No gross focal neurologic deficits are appreciated.  Skin:  Skin is warm,  dry and intact. No rash noted. Psychiatric: Mood and affect are normal. Speech and behavior are normal. Patient exhibits appropriate insight and judgment.  ____________________________________________    LABS (pertinent positives/negatives)  None  ____________________________________________   EKG  None  ____________________________________________    RADIOLOGY  CXR  CLINICAL DATA: Fall. Left anterior rib pain  EXAM: LEFT RIBS AND CHEST - 3+ VIEW  COMPARISON: 01/28/2016  FINDINGS: No fracture or other bone lesions are seen involving the ribs. There is no evidence of pneumothorax or pleural effusion. Both lungs are clear. Heart size and mediastinal contours are within normal limits.  IMPRESSION: Negative.        ____________________________________________   PROCEDURES  Procedures  ____________________________________________   INITIAL IMPRESSION / ASSESSMENT AND PLAN / ED COURSE  Pertinent labs & imaging results that were available during my care of the patient were reviewed by me and considered in my medical decision making (see chart for details).  Patient presents to the emergency department today because of concerns for left chest pain after a fall. X-rays negative for any acute osseous injury. Will  plan on giving patient incentive spirometer and lidocaine patch.  ----------------------------------------- 7:08 PM on 07/22/2016 -----------------------------------------  Patient  is now complaining of epigastric pain. This occurred after he was told he was being discharged. Out of abundance of caution will check lipase CMP and CBC.  Blood work without concerning findings. Patient states that he did feel somewhat better after GI cocktail. ____________________________________________   FINAL CLINICAL IMPRESSION(S) / ED DIAGNOSES  Final diagnoses:  Rib contusion, left, initial encounter     Note: This dictation was prepared with Dragon  dictation. Any transcriptional errors that result from this process are unintentional    Nance Pear, MD 07/22/16 [place], MD 07/22/16 2115

## 2016-07-22 NOTE — ED Notes (Signed)
Pt offered to go to lobby and have RN call taxi for him. Pt states that he needs to remain in bed for "a little longer" before he is able to ride home in taxi comfortably.

## 2016-07-27 ENCOUNTER — Emergency Department: Payer: Self-pay

## 2016-07-27 ENCOUNTER — Emergency Department
Admission: EM | Admit: 2016-07-27 | Discharge: 2016-07-27 | Disposition: A | Discharge status: Home / Self Care | Payer: Self-pay | Attending: Emergency Medicine | Admitting: Emergency Medicine

## 2016-07-27 DIAGNOSIS — J45909 Unspecified asthma, uncomplicated: Secondary | ICD-10-CM | POA: Insufficient documentation

## 2016-07-27 DIAGNOSIS — Z79899 Other long term (current) drug therapy: Secondary | ICD-10-CM | POA: Insufficient documentation

## 2016-07-27 DIAGNOSIS — I10 Essential (primary) hypertension: Secondary | ICD-10-CM | POA: Insufficient documentation

## 2016-07-27 DIAGNOSIS — R0789 Other chest pain: Secondary | ICD-10-CM | POA: Insufficient documentation

## 2016-07-27 DIAGNOSIS — M7918 Myalgia, other site: Secondary | ICD-10-CM

## 2016-07-27 DIAGNOSIS — Z87891 Personal history of nicotine dependence: Secondary | ICD-10-CM | POA: Insufficient documentation

## 2016-07-27 DIAGNOSIS — Z791 Long term (current) use of non-steroidal anti-inflammatories (NSAID): Secondary | ICD-10-CM | POA: Insufficient documentation

## 2016-07-27 DIAGNOSIS — R079 Chest pain, unspecified: Secondary | ICD-10-CM

## 2016-07-27 DIAGNOSIS — M791 Myalgia: Secondary | ICD-10-CM | POA: Insufficient documentation

## 2016-07-27 DIAGNOSIS — R42 Dizziness and giddiness: Secondary | ICD-10-CM | POA: Insufficient documentation

## 2016-07-27 LAB — COMPREHENSIVE METABOLIC PANEL
ALT: 26 U/L (ref 17–63)
AST: 24 U/L (ref 15–41)
Albumin: 4.5 g/dL (ref 3.5–5.0)
Alkaline Phosphatase: 87 U/L (ref 38–126)
Anion gap: 13 (ref 5–15)
BUN: 15 mg/dL (ref 6–20)
CO2: 25 mmol/L (ref 22–32)
Calcium: 10.1 mg/dL (ref 8.9–10.3)
Chloride: 99 mmol/L — ABNORMAL LOW (ref 101–111)
Creatinine, Ser: 1.1 mg/dL (ref 0.61–1.24)
GFR calc Af Amer: >60 mL/min (ref >60)
GFR calc non Af Amer: >60 mL/min (ref >60)
Glucose, Bld: 125 mg/dL — ABNORMAL HIGH (ref 65–99)
Potassium: 3.9 mmol/L (ref 3.5–5.1)
Sodium: 137 mmol/L (ref 135–145)
Total Bilirubin: 3.1 mg/dL — ABNORMAL HIGH (ref 0.3–1.2)
Total Protein: 7.7 g/dL (ref 6.5–8.1)

## 2016-07-27 LAB — CBC WITH DIFFERENTIAL/PLATELET
Basophils Absolute: 0 10*3/uL (ref 0–0.1)
Basophils Relative: 1 %
Eosinophils Absolute: 0.1 10*3/uL (ref 0–0.7)
Eosinophils Relative: 1 %
HCT: 43.8 % (ref 40.0–52.0)
Hemoglobin: 15.1 g/dL (ref 13.0–18.0)
Lymphocytes Relative: 23 %
Lymphs Abs: 1.7 10*3/uL (ref 1.0–3.6)
MCH: 30.5 pg (ref 26.0–34.0)
MCHC: 34.4 g/dL (ref 32.0–36.0)
MCV: 88.7 fL (ref 80.0–100.0)
Monocytes Absolute: 1 10*3/uL (ref 0.2–1.0)
Monocytes Relative: 14 %
Neutro Abs: 4.7 10*3/uL (ref 1.4–6.5)
Neutrophils Relative %: 61 %
Platelets: 174 10*3/uL (ref 150–440)
RBC: 4.94 MIL/uL (ref 4.40–5.90)
RDW: 18 % — ABNORMAL HIGH (ref 11.5–14.5)
WBC: 7.5 10*3/uL (ref 3.8–10.6)

## 2016-07-27 LAB — LIPASE, BLOOD: Lipase: 30 U/L (ref 11–51)

## 2016-07-27 LAB — TROPONIN I
Troponin I: <0.03 ng/mL (ref <0.03)
Troponin I: <0.03 ng/mL (ref <0.03)

## 2016-07-27 MED ORDER — OXYCODONE-ACETAMINOPHEN 5-325 MG PO TABS
1.0000 | ORAL_TABLET | Freq: Once | ORAL | Status: AC
  Administered 2016-07-27: 1 via ORAL

## 2016-07-27 MED ORDER — ETODOLAC 200 MG PO CAPS: 200.0000 mg | ORAL_CAPSULE | Freq: Three times a day (TID) | ORAL | 0 refills | Status: DC

## 2016-07-27 MED ORDER — MORPHINE SULFATE (PF) 4 MG/ML IV SOLN
4.0000 mg | Freq: Once | INTRAVENOUS | Status: AC
  Administered 2016-07-27: 4 mg via INTRAVENOUS
  Filled 2016-07-27: qty 1

## 2016-07-27 MED ORDER — KETOROLAC TROMETHAMINE 30 MG/ML IJ SOLN
30.0000 mg | Freq: Once | INTRAMUSCULAR | Status: AC
  Administered 2016-07-27: 30 mg via INTRAVENOUS
  Filled 2016-07-27: qty 1

## 2016-07-27 MED ORDER — OXYCODONE-ACETAMINOPHEN 5-325 MG PO TABS: 1.0000 | ORAL_TABLET | Freq: Once | ORAL | Status: DC

## 2016-07-27 MED ORDER — MORPHINE SULFATE (PF) 4 MG/ML IV SOLN
INTRAVENOUS | Status: AC
  Administered 2016-07-27: 4 mg via INTRAVENOUS
  Filled 2016-07-27: qty 1

## 2016-07-27 MED ORDER — MORPHINE SULFATE (PF) 4 MG/ML IV SOLN
4.0000 mg | Freq: Once | INTRAVENOUS | Status: AC
  Administered 2016-07-27: 4 mg via INTRAVENOUS

## 2016-07-27 MED ORDER — OXYCODONE-ACETAMINOPHEN 5-325 MG PO TABS
ORAL_TABLET | ORAL | Status: AC
  Administered 2016-07-27: 1 via ORAL
  Filled 2016-07-27: qty 1

## 2016-07-27 MED ORDER — ONDANSETRON HCL 4 MG/2ML IJ SOLN
4.0000 mg | Freq: Once | INTRAMUSCULAR | Status: AC
  Administered 2016-07-27: 4 mg via INTRAVENOUS
  Filled 2016-07-27: qty 2

## 2016-07-27 MED ORDER — IOPAMIDOL (ISOVUE-300) INJECTION 61%
75.0000 mL | Freq: Once | INTRAVENOUS | Status: AC | PRN
  Administered 2016-07-27: 75 mL via INTRAVENOUS

## 2016-07-27 MED ORDER — OXYCODONE-ACETAMINOPHEN 5-325 MG PO TABS: 1.0000 | ORAL_TABLET | Freq: Four times a day (QID) | ORAL | 0 refills | Status: DC | PRN

## 2016-07-27 MED ORDER — SODIUM CHLORIDE 0.9 % IV BOLUS (SEPSIS)
1000.0000 mL | Freq: Once | INTRAVENOUS | Status: AC
  Administered 2016-07-27: 1000 mL via INTRAVENOUS

## 2016-07-27 NOTE — ED Provider Notes (Signed)
[facility] Emergency Department Provider Note   ____________________________________________   First MD Initiated Contact with Patient 07/27/16 0134     (approximate)  I have reviewed the triage vital signs and the nursing notes.   HISTORY  Chief Complaint Chest Pain    HPI Miguel [name] is a 55 y.o. male who comes into the hospital today with pain in his ribs. The patient fell on his ribs on Tuesday. He reports that he was reaching for something in the car and fell onto his chest. He was seen and sent home with some Lidoderm patches but the pain is been getting worse and worse. He reports that he has been unable to take the pain anymore. She did try to take some Aleve but it was not helpful. He is unable to eat and reports that it hurts to breathe as well as having some constant pain. The patient rates his pain a 10 out of 10 in intensity. He denies any loss of consciousness but feels worn out from all this pain. He reports that he has no appetite and he could barely take his medications. The patient wanted to come a few days ago but has to care for his sick wife at home which is what brought him in tonight instead. The patient is here for evaluation.   Past Medical History:  Diagnosis Date  . Asthma   . GERD (gastroesophageal reflux disease)   . Gout   . Hypertension   . OCD (obsessive compulsive disorder)   . Renal colic     Patient Active Problem List   Diagnosis Date Noted  . Alcohol abuse 02/05/2016  . Hypertension 12/05/2015  . Tachycardia 12/05/2015  . Gout 11/13/2015  . Chronic back pain 05/02/2015    Past Surgical History:  Procedure Laterality Date  . CHOLECYSTECTOMY  2012    Prior to Admission medications   Medication Sig Start Date End Date Taking? Authorizing Provider  albuterol (VENTOLIN HFA) 108 (90 Base) MCG/ACT inhaler Inhale 2 puffs into the lungs every 4 (four) hours as needed for wheezing or shortness of breath.     Historical Provider, MD  allopurinol (ZYLOPRIM) 100 MG tablet Take 100 mg by mouth daily.    Historical Provider, MD  chlordiazePOXIDE (LIBRIUM) 10 MG capsule Take 1 capsule (10 mg total) by mouth 3 (three) times daily as needed for anxiety or withdrawal. 06/30/16   Carrie Mew, MD  colchicine 0.6 MG tablet 1 tablet once or twice a day with food 04/30/16 05/31/16  Larene Beach A McGowan, PA-C  Dexlansoprazole 30 MG capsule Take 30 mg by mouth daily.    Historical Provider, MD  esomeprazole (NEXIUM) 40 MG capsule Take 40 mg by mouth daily at 12 noon. Reported on 05/07/2016    Historical Provider, MD  etodolac (LODINE) 200 MG capsule Take 1 capsule (200 mg total) by mouth every 8 (eight) hours. 07/27/16   Loney Hering, MD  Fluticasone-Salmeterol (ADVAIR) 100-50 MCG/DOSE AEPB Inhale 1 puff into the lungs 2 (two) times daily.    Historical Provider, MD  gabapentin (NEURONTIN) 300 MG capsule Take 300 mg by mouth at bedtime. Reported on 05/07/2016    Historical Provider, MD  hydrochlorothiazide (HYDRODIURIL) 25 MG tablet Take 25 mg by mouth daily. Reported on 05/07/2016    Historical Provider, MD  HYDROcodone-acetaminophen (NORCO/VICODIN) 5-325 MG tablet Take 1-2 tablets by mouth every 4 (four) hours as needed for moderate pain. Patient not taking: Reported on 05/07/2016 05/02/16   Tommi Rumps  Karma Greaser, MD  ibuprofen (ADVIL,MOTRIN) 600 MG tablet Take 1 tablet (600 mg total) by mouth every 6 (six) hours as needed. Patient not taking: Reported on 04/30/2016 12/31/15   Sable Feil, PA-C  lidocaine (LIDODERM) 5 % Place 1 patch onto the skin every 12 (twelve) hours. Remove & Discard patch within 12 hours or as directed by MD 07/22/16 07/22/17  Nance Pear, MD  lisinopril (PRINIVIL,ZESTRIL) 20 MG tablet Take 20 mg by mouth daily.     Historical Provider, MD  LORazepam (ATIVAN) 1 MG tablet Take 1 tablet (1 mg total) by mouth 2 (two) times daily as needed for anxiety. Patient not taking: Reported on 04/30/2016 04/01/16    Daymon Larsen, MD  metoprolol (LOPRESSOR) 50 MG tablet Take 50 mg by mouth 2 (two) times daily. Reported on 05/07/2016    Historical Provider, MD  naproxen (EC NAPROSYN) 500 MG EC tablet Take 1 tablet (500 mg total) by mouth 2 (two) times daily with a meal. Patient not taking: Reported on 04/30/2016 02/13/16   Cannon Kettle V Bacon Menshew, PA-C  nortriptyline (PAMELOR) 25 MG capsule Take 25 mg by mouth at bedtime. Reported on 05/07/2016    Historical Provider, MD  oxyCODONE-acetaminophen (ROXICET) 5-325 MG tablet Take 1 tablet by mouth every 6 (six) hours as needed. 07/27/16   Loney Hering, MD  potassium chloride (K-DUR) 10 MEQ tablet Take 1 tablet (10 mEq total) by mouth daily. Patient not taking: Reported on 05/07/2016 04/30/16   Larene Beach A McGowan, PA-C  predniSONE (DELTASONE) 20 MG tablet Take 3 tablets (60 mg total) by mouth daily. Patient not taking: Reported on 05/07/2016 05/02/16   Hinda Kehr, MD    Allergies Review of patient's allergies indicates no known allergies.  No family history on file.  Social History Social History  Substance Use Topics  . Smoking status: Former Research scientist (life sciences)  . Smokeless tobacco: Never Used  . Alcohol use Yes    Review of Systems Constitutional: No fever/chills Eyes: No visual changes. ENT: No sore throat. Cardiovascular:  chest pain. Respiratory: Denies shortness of breath. Gastrointestinal: No abdominal pain.  No nausea, no vomiting.  No diarrhea.  No constipation. Genitourinary: Negative for dysuria. Musculoskeletal: Chest pain Skin: Negative for rash. Neurological: Dizziness  10-point ROS otherwise negative.  ____________________________________________   PHYSICAL EXAM:  VITAL SIGNS: ED Triage Vitals  Enc Vitals Group     BP 07/27/16 0101 (!) 124/99     Pulse Rate 07/27/16 0101 (!) 121     Resp 07/27/16 0101 20     Temp 07/27/16 0101 98.4 F (36.9 C)     Temp Source 07/27/16 0101 Oral     SpO2 07/27/16 0101 100 %     Weight 07/27/16  0102 185 lb (83.9 kg)     Height 07/27/16 0102 6\' 1"  (1.854 m)     Head Circumference --      Peak Flow --      Pain Score 07/27/16 0102 10     Pain Loc --      Pain Edu? --      Excl. in [place]? --     Constitutional: Alert and oriented. Well appearing and in no acute distress. Eyes: Conjunctivae are normal. PERRL. EOMI. Head: Atraumatic. Nose: No congestion/rhinnorhea. Mouth/Throat: Mucous membranes are moist.  Oropharynx non-erythematous. Cardiovascular: Tachycardia regular rhythm. Grossly normal heart sounds.  Good peripheral circulation. Respiratory: Normal respiratory effort.  No retractions. Lungs CTAB. Gastrointestinal: Soft with epigastric tenderness to palpation. No distention. Positive bowel sounds  Musculoskeletal: No lower extremity tenderness nor edema.  Tenderness palpation of anterior chest Neurologic:  Normal speech and language.  Skin:  Skin is warm, dry and intact. No bruising noted Psychiatric: Mood and affect are normal.   ____________________________________________   LABS (all labs ordered are listed, but only abnormal results are displayed)  Labs Reviewed  CBC WITH DIFFERENTIAL/PLATELET - Abnormal; Notable for the following:       Result Value   RDW 18.0 (*)    All other components within normal limits  COMPREHENSIVE METABOLIC PANEL - Abnormal; Notable for the following:    Chloride 99 (*)    Glucose, Bld 125 (*)    Total Bilirubin 3.1 (*)    All other components within normal limits  TROPONIN I  LIPASE, BLOOD  TROPONIN I   ____________________________________________  EKG  ED ECG REPORT I, Loney Hering, the attending physician, personally viewed and interpreted this ECG.   Date: 07/27/2016  EKG Time: 110  Rate: 92  Rhythm: normal sinus rhythm  Axis: normal  Intervals:none  ST&T Change: none  ____________________________________________  RADIOLOGY  CT chest   ____________________________________________   PROCEDURES  Procedure(s) performed: None  Procedures  Critical Care performed: No  ____________________________________________   INITIAL IMPRESSION / ASSESSMENT AND PLAN / ED COURSE  Pertinent labs & imaging results that were available during my care of the patient were reviewed by me and considered in my medical decision making (see chart for details).  This is a 55 year old male who comes into the hospital today with some chest pain. The patient fell on his chest days ago and is having worsening pain at this time. I will give the patient a liter of normal saline as he is tachycardic and I will give the patient some morphine and Zofran. I will perform a CT scan to evaluate for possible fracture or pneumonia and the patient will be reassessed.  Clinical Course  Value Comment By Time  CT CHEST W CONTRAST No significant abnormality Loney Hering, MD 09/11 [phone]   The patient's pain is improved with some morphine. I also gave the patient some Toradol. His tachycardia is improved and he is resting comfortably. The patient reports that he is anxious about having more pain at home but I informed him that his CT scan did not show any fractures or pneumonia. I will give the patient some medication for home. The patient has no further complaints or concerns at this time he will follow-up with his doctor.  ____________________________________________   FINAL CLINICAL IMPRESSION(S) / ED DIAGNOSES  Final diagnoses:  Chest pain, unspecified chest pain type  Chest wall pain  Musculoskeletal pain      NEW MEDICATIONS STARTED DURING THIS VISIT:  New Prescriptions   ETODOLAC (LODINE) 200 MG CAPSULE    Take 1 capsule (200 mg total) by mouth every 8 (eight) hours.   OXYCODONE-ACETAMINOPHEN (ROXICET) 5-325 MG TABLET    Take 1 tablet by mouth every 6 (six) hours as needed.     Note:  This document was prepared using Dragon voice  recognition software and may include unintentional dictation errors.    Loney Hering, MD 07/27/16 [phone]

## 2016-07-27 NOTE — ED Triage Notes (Signed)
Pt states that he fell Tuesday hitting his chest has been having increased pain with diff breathing, pt appears to be very uncomfortable, states unable to sit difficult to breathe

## 2016-07-28 ENCOUNTER — Encounter: Payer: Self-pay | Admitting: Physician Assistant

## 2016-07-28 ENCOUNTER — Emergency Department
Admission: EM | Admit: 2016-07-28 | Discharge: 2016-07-28 | Disposition: A | Discharge status: Home / Self Care | Payer: Self-pay | Attending: Emergency Medicine | Admitting: Emergency Medicine

## 2016-07-28 DIAGNOSIS — Z87891 Personal history of nicotine dependence: Secondary | ICD-10-CM | POA: Insufficient documentation

## 2016-07-28 DIAGNOSIS — Z791 Long term (current) use of non-steroidal anti-inflammatories (NSAID): Secondary | ICD-10-CM | POA: Insufficient documentation

## 2016-07-28 DIAGNOSIS — R0789 Other chest pain: Secondary | ICD-10-CM

## 2016-07-28 DIAGNOSIS — I10 Essential (primary) hypertension: Secondary | ICD-10-CM | POA: Insufficient documentation

## 2016-07-28 DIAGNOSIS — S20211D Contusion of right front wall of thorax, subsequent encounter: Secondary | ICD-10-CM | POA: Insufficient documentation

## 2016-07-28 DIAGNOSIS — J45909 Unspecified asthma, uncomplicated: Secondary | ICD-10-CM | POA: Insufficient documentation

## 2016-07-28 DIAGNOSIS — W19XXXD Unspecified fall, subsequent encounter: Secondary | ICD-10-CM | POA: Insufficient documentation

## 2016-07-28 DIAGNOSIS — Z79899 Other long term (current) drug therapy: Secondary | ICD-10-CM | POA: Insufficient documentation

## 2016-07-28 MED ORDER — IPRATROPIUM-ALBUTEROL 0.5-2.5 (3) MG/3ML IN SOLN
3.0000 mL | Freq: Once | RESPIRATORY_TRACT | Status: AC
  Administered 2016-07-28: 3 mL via RESPIRATORY_TRACT
  Filled 2016-07-28: qty 3

## 2016-07-28 MED ORDER — CYCLOBENZAPRINE HCL 5 MG PO TABS: 5.0000 mg | ORAL_TABLET | Freq: Three times a day (TID) | ORAL | 0 refills | Status: DC | PRN

## 2016-07-28 MED ORDER — KETOROLAC TROMETHAMINE 10 MG PO TABS: 10.0000 mg | ORAL_TABLET | Freq: Three times a day (TID) | ORAL | 0 refills | Status: DC

## 2016-07-28 MED ORDER — ORPHENADRINE CITRATE 30 MG/ML IJ SOLN
60.0000 mg | INTRAMUSCULAR | Status: AC
  Administered 2016-07-28: 60 mg via INTRAMUSCULAR
  Filled 2016-07-28: qty 2

## 2016-07-28 MED ORDER — KETOROLAC TROMETHAMINE 60 MG/2ML IM SOLN
30.0000 mg | Freq: Once | INTRAMUSCULAR | Status: AC
  Administered 2016-07-28: 30 mg via INTRAMUSCULAR
  Filled 2016-07-28: qty 2

## 2016-07-28 NOTE — ED Notes (Addendum)
Pt co rib pain since fall last Tuesday. Was seen here for the same yesterday and discharged home with pain meds.

## 2016-07-28 NOTE — Discharge Instructions (Signed)
Your exam is essentially normal today. You have continued pain which is not due to a rib fracture or underlying lung infection. Take the prescription meds as directed. Apply warm compresses and/or ice packs to the chest wall for pain relief. Follow-up with your provider for continued symptoms.

## 2016-07-28 NOTE — ED Provider Notes (Signed)
[facility] Emergency Department Provider Note ____________________________________________  Time seen: 2049  I have reviewed the triage vital signs and the nursing notes.  HISTORY  Chief Complaint  Rib Injury  HPI Miguel [name] is a 55 y.o. male return to the ED with complaints of continued right-sided rib pain following a fall at home week prior. Patient has been evaluated here in the ED yesterdaywith complaints of pain he reports due to falling on something in the car as he reached in to see. He was also evaluated here on 9/6 and reports pain to the left rib and chest after a fall at home. On his visit yesterday, he was clear with a negative chest CT given his continued reports the pain. He was discharged with a prescription for #12 Percocet and lodine. He reports today that his pain is not relieved by the Lidoderm patch or the other prescription medications. Review of the [place] controlled substance database does reveal a Percocet prescription was filled yesterday as prescribed. Patient denies any interim injury, accident, fall.  Past Medical History:  Diagnosis Date  . Asthma   . GERD (gastroesophageal reflux disease)   . Gout   . Hypertension   . OCD (obsessive compulsive disorder)   . Renal colic     Patient Active Problem List   Diagnosis Date Noted  . Alcohol abuse 02/05/2016  . Hypertension 12/05/2015  . Tachycardia 12/05/2015  . Gout 11/13/2015  . Chronic back pain 05/02/2015    Past Surgical History:  Procedure Laterality Date  . CHOLECYSTECTOMY  2012    Prior to Admission medications   Medication Sig Start Date End Date Taking? Authorizing Provider  albuterol (VENTOLIN HFA) 108 (90 Base) MCG/ACT inhaler Inhale 2 puffs into the lungs every 4 (four) hours as needed for wheezing or shortness of breath.    Historical Provider, MD  allopurinol (ZYLOPRIM) 100 MG tablet Take 100 mg by mouth daily.    Historical Provider, MD  chlordiazePOXIDE (LIBRIUM)  10 MG capsule Take 1 capsule (10 mg total) by mouth 3 (three) times daily as needed for anxiety or withdrawal. 06/30/16   Carrie Mew, MD  colchicine 0.6 MG tablet 1 tablet once or twice a day with food 04/30/16 05/31/16  Larene Beach A McGowan, PA-C  cyclobenzaprine (FLEXERIL) 5 MG tablet Take 1 tablet (5 mg total) by mouth 3 (three) times daily as needed for muscle spasms. 07/28/16   Josiyah Tozzi V Bacon Rayana Geurin, PA-C  Dexlansoprazole 30 MG capsule Take 30 mg by mouth daily.    Historical Provider, MD  esomeprazole (NEXIUM) 40 MG capsule Take 40 mg by mouth daily at 12 noon. Reported on 05/07/2016    Historical Provider, MD  etodolac (LODINE) 200 MG capsule Take 1 capsule (200 mg total) by mouth every 8 (eight) hours. 07/27/16   Loney Hering, MD  Fluticasone-Salmeterol (ADVAIR) 100-50 MCG/DOSE AEPB Inhale 1 puff into the lungs 2 (two) times daily.    Historical Provider, MD  gabapentin (NEURONTIN) 300 MG capsule Take 300 mg by mouth at bedtime. Reported on 05/07/2016    Historical Provider, MD  hydrochlorothiazide (HYDRODIURIL) 25 MG tablet Take 25 mg by mouth daily. Reported on 05/07/2016    Historical Provider, MD  HYDROcodone-acetaminophen (NORCO/VICODIN) 5-325 MG tablet Take 1-2 tablets by mouth every 4 (four) hours as needed for moderate pain. Patient not taking: Reported on 05/07/2016 05/02/16   Hinda Kehr, MD  ibuprofen (ADVIL,MOTRIN) 600 MG tablet Take 1 tablet (600 mg total) by mouth every 6 (  six) hours as needed. Patient not taking: Reported on 04/30/2016 12/31/15   Sable Feil, PA-C  ketorolac (TORADOL) 10 MG tablet Take 1 tablet (10 mg total) by mouth every 8 (eight) hours. 07/28/16   Ahlam Piscitelli V Bacon Georgi Tuel, PA-C  lidocaine (LIDODERM) 5 % Place 1 patch onto the skin every 12 (twelve) hours. Remove & Discard patch within 12 hours or as directed by MD 07/22/16 07/22/17  Nance Pear, MD  lisinopril (PRINIVIL,ZESTRIL) 20 MG tablet Take 20 mg by mouth daily.     Historical Provider, MD  LORazepam  (ATIVAN) 1 MG tablet Take 1 tablet (1 mg total) by mouth 2 (two) times daily as needed for anxiety. Patient not taking: Reported on 04/30/2016 04/01/16   Daymon Larsen, MD  metoprolol (LOPRESSOR) 50 MG tablet Take 50 mg by mouth 2 (two) times daily. Reported on 05/07/2016    Historical Provider, MD  naproxen (EC NAPROSYN) 500 MG EC tablet Take 1 tablet (500 mg total) by mouth 2 (two) times daily with a meal. Patient not taking: Reported on 04/30/2016 02/13/16   Cannon Kettle V Bacon Rohn Fritsch, PA-C  nortriptyline (PAMELOR) 25 MG capsule Take 25 mg by mouth at bedtime. Reported on 05/07/2016    Historical Provider, MD  oxyCODONE-acetaminophen (ROXICET) 5-325 MG tablet Take 1 tablet by mouth every 6 (six) hours as needed. 07/27/16   Loney Hering, MD  potassium chloride (K-DUR) 10 MEQ tablet Take 1 tablet (10 mEq total) by mouth daily. Patient not taking: Reported on 05/07/2016 04/30/16   Larene Beach A McGowan, PA-C  predniSONE (DELTASONE) 20 MG tablet Take 3 tablets (60 mg total) by mouth daily. Patient not taking: Reported on 05/07/2016 05/02/16   Hinda Kehr, MD    Allergies Review of patient's allergies indicates no known allergies.  No family history on file.  Social History Social History  Substance Use Topics  . Smoking status: Former Research scientist (life sciences)  . Smokeless tobacco: Never Used  . Alcohol use Yes    Review of Systems  Constitutional: Negative for fever. Cardiovascular: Negative for chest pain. Respiratory: Negative for shortness of breath. Gastrointestinal: Negative for abdominal pain, vomiting and diarrhea. Genitourinary: Negative for dysuria. Musculoskeletal: Negative for back pain. Reports right chest wall pain. Skin: Negative for rash. Neurological: Negative for headaches, focal weakness or numbness. ____________________________________________  PHYSICAL EXAM:  VITAL SIGNS: ED Triage Vitals  Enc Vitals Group     BP 07/28/16 2028 100/69     Pulse Rate 07/28/16 2028 100     Resp --       Temp 07/28/16 2028 97.5 F (36.4 C)     Temp Source 07/28/16 2028 Oral     SpO2 07/28/16 2028 100 %     Weight 07/28/16 2027 185 lb (83.9 kg)     Height 07/28/16 2027 6' (1.829 m)     Head Circumference --      Peak Flow --      Pain Score 07/28/16 2027 10     Pain Loc --      Pain Edu? --      Excl. in [place]? --    Constitutional: Alert and oriented. Well appearing and in no distress. Head: Normocephalic and atraumatic. Cardiovascular: Normal rate, regular rhythm.  Respiratory: Normal respiratory effort. No wheezes/rales/rhonchi. Gastrointestinal: Soft and nontender. No distention. Musculoskeletal: Nontender with normal range of motion in all extremities.  Neurologic:  Normal gait without ataxia. Normal speech and language. No gross focal neurologic deficits are appreciated. Skin:  Skin is warm,  dry and intact. No rash , bruise, ecchymosis, or abrasion noted.  Psychiatric: Mood and affect are normal. Patient exhibits appropriate insight and judgment. ___________________________________________  PROCEDURES  Toradol 30 mg IM Norflex 60 mg IM DuoNeb x 1 ____________________________________________  INITIAL IMPRESSION / ASSESSMENT AND PLAN / ED COURSE  Patient with continued complaints of pain to now the right chest wall following a contusion of week prior. He has had multiple ED visits for the same complaint. Patient is discharged with a prescription for Toradol and Norflex to dose as directed. He will follow with his primary care provider or [facility] committee clinic as needed for ongoing management. He is advised to apply moist heat and/or ice to the chest wall. He will continue with the Lidoderm patches as previously prescribed. He is reassured by his previous negative CT scan and unremarkable labs.  Clinical Course   ____________________________________________  FINAL CLINICAL IMPRESSION(S) / ED DIAGNOSES  Final diagnoses:  Rib contusion, right, subsequent encounter  Anterior  chest wall pain      Melvenia Needles, PA-C 07/28/16 2149    Nena Polio, MD 07/29/16 [phone]

## 2016-07-28 NOTE — ED Triage Notes (Signed)
Patient ambulatory to triage with steady gait, without difficulty or distress noted; pt reports here last Tuesday for fall and "bruise to rib"; st rx lidoderm patch and pain unrelieved and unable to get an appt with his doctor for two weeks

## 2016-07-28 NOTE — ED Notes (Signed)
pt reports here last Tuesday for fall and "bruise to rib".  Pt states lidoderm patch not helping nor is other pain meds they gave me yesterday. Pt unable to get an appt with his doctor for two weeks. Pt states cant get and drink much. Pt states cant take this pain. Pt is breathing even and unlabored. Pt is concerned because he is the caregiver for his wife. Pt states he was dropped off tonight.

## 2016-08-05 ENCOUNTER — Other Ambulatory Visit: Payer: Self-pay | Admitting: Internal Medicine

## 2016-08-12 ENCOUNTER — Emergency Department: Payer: Self-pay

## 2016-08-12 ENCOUNTER — Emergency Department
Admission: EM | Admit: 2016-08-12 | Discharge: 2016-08-12 | Disposition: A | Discharge status: Home / Self Care | Payer: Self-pay | Attending: Student in an Organized Health Care Education/Training Program | Admitting: Student in an Organized Health Care Education/Training Program

## 2016-08-12 ENCOUNTER — Encounter: Payer: Self-pay | Admitting: Emergency Medicine

## 2016-08-12 DIAGNOSIS — R0789 Other chest pain: Secondary | ICD-10-CM | POA: Insufficient documentation

## 2016-08-12 DIAGNOSIS — R079 Chest pain, unspecified: Secondary | ICD-10-CM

## 2016-08-12 DIAGNOSIS — Z79899 Other long term (current) drug therapy: Secondary | ICD-10-CM | POA: Insufficient documentation

## 2016-08-12 DIAGNOSIS — J45909 Unspecified asthma, uncomplicated: Secondary | ICD-10-CM | POA: Insufficient documentation

## 2016-08-12 DIAGNOSIS — Z87891 Personal history of nicotine dependence: Secondary | ICD-10-CM | POA: Insufficient documentation

## 2016-08-12 DIAGNOSIS — I10 Essential (primary) hypertension: Secondary | ICD-10-CM | POA: Insufficient documentation

## 2016-08-12 MED ORDER — NAPROXEN 500 MG PO TABS: 250.0000 mg | ORAL_TABLET | Freq: Two times a day (BID) | ORAL | 0 refills | Status: AC

## 2016-08-12 MED ORDER — DIAZEPAM 5 MG PO TABS
10.0000 mg | ORAL_TABLET | Freq: Once | ORAL | Status: AC
  Administered 2016-08-12: 10 mg via ORAL
  Filled 2016-08-12: qty 2

## 2016-08-12 NOTE — ED Triage Notes (Addendum)
Pt presents with left side rib pain for three weeks. No acute distress noted. No respiratory distress noted.

## 2016-08-12 NOTE — ED Notes (Signed)
Pt reports he is having bilat rib pain from fall that occurred a three weeks ago - he states he is "not breathing well" - pt states he has already been seen several times in the er for this and told that he had "bruised ribs" - he states he was given antiinflammatory that lasted a few days but pain continues - pt reports he is unable to sleep or rest due to the pain

## 2016-08-12 NOTE — Discharge Instructions (Signed)
Return to ER for any increase in pain, if the pain changes or becomes worse with physical activity, you have shortness of breath, nausea or vomiting associated with the chest pain. ° °

## 2016-08-12 NOTE — ED Provider Notes (Signed)
[facility] Emergency Department Provider Note    First MD Initiated Contact with Patient 08/12/16 2013     (approximate)  I have reviewed the triage vital signs and the nursing notes.   HISTORY  Chief Complaint RIb Pain    HPI Miguel [name] is a 55 y.o. male chief complaint of 6 weeks of persistent anterior chest wall pain after falling on his car. Patient has been seen multiple times in the ER for similar complaints. Patient is concerned as the pain has not gotten any better. Denies any fevers. No shortness of breath. No change in the character of the pain. No interval trauma. Denies any worsening or shortness of breath with exertion. No lower extremity swelling.   Past Medical History:  Diagnosis Date  . Asthma   . GERD (gastroesophageal reflux disease)   . Gout   . Hypertension   . OCD (obsessive compulsive disorder)   . Renal colic     Patient Active Problem List   Diagnosis Date Noted  . Alcohol abuse 02/05/2016  . Hypertension 12/05/2015  . Tachycardia 12/05/2015  . Gout 11/13/2015  . Chronic back pain 05/02/2015    Past Surgical History:  Procedure Laterality Date  . CHOLECYSTECTOMY  2012    Prior to Admission medications   Medication Sig Start Date End Date Taking? Authorizing Provider  albuterol (VENTOLIN HFA) 108 (90 Base) MCG/ACT inhaler Inhale 2 puffs into the lungs every 4 (four) hours as needed for wheezing or shortness of breath.    Historical Provider, MD  allopurinol (ZYLOPRIM) 100 MG tablet Take 100 mg by mouth daily.    Historical Provider, MD  chlordiazePOXIDE (LIBRIUM) 10 MG capsule Take 1 capsule (10 mg total) by mouth 3 (three) times daily as needed for anxiety or withdrawal. 06/30/16   Carrie Mew, MD  colchicine 0.6 MG tablet 1 tablet once or twice a day with food 04/30/16 05/31/16  Larene Beach A McGowan, PA-C  cyclobenzaprine (FLEXERIL) 5 MG tablet Take 1 tablet (5 mg total) by mouth 3 (three) times daily as needed  for muscle spasms. 07/28/16   Jenise V Bacon Menshew, PA-C  Dexlansoprazole 30 MG capsule Take 30 mg by mouth daily.    Historical Provider, MD  esomeprazole (NEXIUM) 40 MG capsule Take 40 mg by mouth daily at 12 noon. Reported on 05/07/2016    Historical Provider, MD  etodolac (LODINE) 200 MG capsule Take 1 capsule (200 mg total) by mouth every 8 (eight) hours. 07/27/16   Loney Hering, MD  Fluticasone-Salmeterol (ADVAIR) 100-50 MCG/DOSE AEPB Inhale 1 puff into the lungs 2 (two) times daily.    Historical Provider, MD  gabapentin (NEURONTIN) 300 MG capsule Take 300 mg by mouth at bedtime. Reported on 05/07/2016    Historical Provider, MD  hydrochlorothiazide (HYDRODIURIL) 25 MG tablet Take 25 mg by mouth daily. Reported on 05/07/2016    Historical Provider, MD  HYDROcodone-acetaminophen (NORCO/VICODIN) 5-325 MG tablet Take 1-2 tablets by mouth every 4 (four) hours as needed for moderate pain. Patient not taking: Reported on 05/07/2016 05/02/16   Hinda Kehr, MD  ibuprofen (ADVIL,MOTRIN) 600 MG tablet Take 1 tablet (600 mg total) by mouth every 6 (six) hours as needed. Patient not taking: Reported on 04/30/2016 12/31/15   Sable Feil, PA-C  ketorolac (TORADOL) 10 MG tablet Take 1 tablet (10 mg total) by mouth every 8 (eight) hours. 07/28/16   Jenise V Bacon Menshew, PA-C  lidocaine (LIDODERM) 5 % Place 1 patch onto the  skin every 12 (twelve) hours. Remove & Discard patch within 12 hours or as directed by MD 07/22/16 07/22/17  Nance Pear, MD  lisinopril (PRINIVIL,ZESTRIL) 20 MG tablet Take 20 mg by mouth daily.     Historical Provider, MD  lisinopril (PRINIVIL,ZESTRIL) 40 MG tablet TAKE ONE TABLET BY MOUTH EVERY DAY 08/05/16   Tawni Millers, MD  LORazepam (ATIVAN) 1 MG tablet Take 1 tablet (1 mg total) by mouth 2 (two) times daily as needed for anxiety. Patient not taking: Reported on 04/30/2016 04/01/16   Daymon Larsen, MD  metoprolol (LOPRESSOR) 50 MG tablet Take 50 mg by mouth 2 (two) times daily.  Reported on 05/07/2016    Historical Provider, MD  naproxen (EC NAPROSYN) 500 MG EC tablet Take 1 tablet (500 mg total) by mouth 2 (two) times daily with a meal. Patient not taking: Reported on 04/30/2016 02/13/16   Cannon Kettle V Bacon Menshew, PA-C  naproxen (NAPROSYN) 500 MG tablet Take 0.5 tablets (250 mg total) by mouth 2 (two) times daily with a meal. 08/12/16 08/17/16  Merlyn Lot, MD  nortriptyline (PAMELOR) 25 MG capsule Take 25 mg by mouth at bedtime. Reported on 05/07/2016    Historical Provider, MD  oxyCODONE-acetaminophen (ROXICET) 5-325 MG tablet Take 1 tablet by mouth every 6 (six) hours as needed. 07/27/16   Loney Hering, MD  potassium chloride (K-DUR) 10 MEQ tablet Take 1 tablet (10 mEq total) by mouth daily. Patient not taking: Reported on 05/07/2016 04/30/16   Larene Beach A McGowan, PA-C  predniSONE (DELTASONE) 20 MG tablet Take 3 tablets (60 mg total) by mouth daily. Patient not taking: Reported on 05/07/2016 05/02/16   Hinda Kehr, MD    Allergies Review of patient's allergies indicates no known allergies.  No family history on file.  Social History Social History  Substance Use Topics  . Smoking status: Former Research scientist (life sciences)  . Smokeless tobacco: Never Used  . Alcohol use Yes    Review of Systems Patient denies headaches, rhinorrhea, blurry vision, numbness, shortness of breath, chest pain, edema, cough, abdominal pain, nausea, vomiting, diarrhea, dysuria, fevers, rashes or hallucinations unless otherwise stated above in HPI. ____________________________________________   PHYSICAL EXAM:  VITAL SIGNS: Vitals:   08/12/16 2029 08/12/16 2030  BP: 109/73 117/79  Pulse: 92 97  Resp: 16   Temp:      Constitutional: Alert and oriented. Anxious appearing but in no acute distress Eyes: Conjunctivae are normal. PERRL. EOMI. Head: Atraumatic. Nose: No congestion/rhinnorhea. Mouth/Throat: Mucous membranes are moist.  Oropharynx non-erythematous. Neck: No stridor. Painless ROM. No  cervical spine tenderness to palpation Hematological/Lymphatic/Immunilogical: No cervical lymphadenopathy. Cardiovascular: Normal rate, regular rhythm. Grossly normal heart sounds.  Good peripheral circulation. Respiratory: Normal respiratory effort.  No retractions. Lungs CTAB. Gastrointestinal: Soft and nontender. No distention. No abdominal bruits. No CVA tenderness. Musculoskeletal: No lower extremity tenderness nor edema.  No joint effusions.  Anterior chest wall pain over mid sternum. No ecchymosis or swelling. Neurologic:  Normal speech and language. No gross focal neurologic deficits are appreciated. No gait instability. Skin:  Skin is warm, dry and intact. No rash noted. Psychiatric: Mood and affect are normal. Speech and behavior are normal.  ____________________________________________   LABS (all labs ordered are listed, but only abnormal results are displayed)  No results found for this or any previous visit (from the past 24 hour(s)). ____________________________________________   EKG Time 21:11, Indication:  chest pain rate 90, normal axis, normal intervals, no acute ischemia ____________________________________________  RADIOLOGY  CXR my read  shows no evidence of acute cardiopulmonary process.  ____________________________________________   PROCEDURES  Procedure(s) performed: none    Critical Care performed: no ____________________________________________   INITIAL IMPRESSION / ASSESSMENT AND PLAN / ED COURSE  Pertinent labs & imaging results that were available during my care of the patient were reviewed by me and considered in my medical decision making (see chart for details).  DDX: rib pain, costochondirits, acs, anxiety  NEESON SUSMAN is a 55 y.o. who presents to the ED with the recurrent anterior chest wall pain. Chest x-ray shows no evidence of pneumothorax, contusion, mediastinal widening or pneumonia. Pain is reproduced with palpation and unchanged  from previous episodes. EKG does not show any evidence of acute ischemia or pericarditis. Patient is well-appearing and nontoxic. Vital signs are stable. Will give patient symptomatic treatment and follow-up with primary care physician.Have discussed with the patient and available family all diagnostics and treatments performed thus far and all questions were answered to the best of my ability. The patient demonstrates understanding and agreement with plan.   Clinical Course     ____________________________________________   FINAL CLINICAL IMPRESSION(S) / ED DIAGNOSES  Final diagnoses:  Chest pain, unspecified chest pain type      NEW MEDICATIONS STARTED DURING THIS VISIT:  New Prescriptions   NAPROXEN (NAPROSYN) 500 MG TABLET    Take 0.5 tablets (250 mg total) by mouth 2 (two) times daily with a meal.     Note:  This document was prepared using Dragon voice recognition software and may include unintentional dictation errors.    Merlyn Lot, MD 08/13/16 [phone]

## 2016-08-12 NOTE — ED Notes (Signed)
Lung sounds are clear bilaterally - pt is making audible "raspy" noise with exhalation that does not match lung sounds - O2 sat 100% on RA

## 2016-08-14 ENCOUNTER — Emergency Department
Admission: EM | Admit: 2016-08-14 | Discharge: 2016-08-15 | Disposition: A | Discharge status: Home / Self Care | Payer: Self-pay | Attending: Emergency Medicine | Admitting: Emergency Medicine

## 2016-08-14 ENCOUNTER — Encounter: Payer: Self-pay | Admitting: Emergency Medicine

## 2016-08-14 DIAGNOSIS — J45909 Unspecified asthma, uncomplicated: Secondary | ICD-10-CM | POA: Insufficient documentation

## 2016-08-14 DIAGNOSIS — S65312A Laceration of deep palmar arch of left hand, initial encounter: Secondary | ICD-10-CM | POA: Insufficient documentation

## 2016-08-14 DIAGNOSIS — Z87891 Personal history of nicotine dependence: Secondary | ICD-10-CM | POA: Insufficient documentation

## 2016-08-14 DIAGNOSIS — Y92009 Unspecified place in unspecified non-institutional (private) residence as the place of occurrence of the external cause: Secondary | ICD-10-CM | POA: Insufficient documentation

## 2016-08-14 DIAGNOSIS — W269XXA Contact with unspecified sharp object(s), initial encounter: Secondary | ICD-10-CM | POA: Insufficient documentation

## 2016-08-14 DIAGNOSIS — Y999 Unspecified external cause status: Secondary | ICD-10-CM | POA: Insufficient documentation

## 2016-08-14 DIAGNOSIS — Z046 Encounter for general psychiatric examination, requested by authority: Secondary | ICD-10-CM

## 2016-08-14 DIAGNOSIS — Y939 Activity, unspecified: Secondary | ICD-10-CM | POA: Insufficient documentation

## 2016-08-14 DIAGNOSIS — F1020 Alcohol dependence, uncomplicated: Secondary | ICD-10-CM | POA: Diagnosis present

## 2016-08-14 DIAGNOSIS — F439 Reaction to severe stress, unspecified: Secondary | ICD-10-CM

## 2016-08-14 DIAGNOSIS — Z7289 Other problems related to lifestyle: Secondary | ICD-10-CM

## 2016-08-14 DIAGNOSIS — Z791 Long term (current) use of non-steroidal anti-inflammatories (NSAID): Secondary | ICD-10-CM | POA: Insufficient documentation

## 2016-08-14 DIAGNOSIS — I10 Essential (primary) hypertension: Secondary | ICD-10-CM | POA: Insufficient documentation

## 2016-08-14 DIAGNOSIS — E876 Hypokalemia: Secondary | ICD-10-CM | POA: Insufficient documentation

## 2016-08-14 DIAGNOSIS — F1994 Other psychoactive substance use, unspecified with psychoactive substance-induced mood disorder: Secondary | ICD-10-CM

## 2016-08-14 DIAGNOSIS — Z79899 Other long term (current) drug therapy: Secondary | ICD-10-CM | POA: Insufficient documentation

## 2016-08-14 DIAGNOSIS — F10220 Alcohol dependence with intoxication, uncomplicated: Secondary | ICD-10-CM | POA: Diagnosis present

## 2016-08-14 DIAGNOSIS — F329 Major depressive disorder, single episode, unspecified: Secondary | ICD-10-CM | POA: Insufficient documentation

## 2016-08-14 DIAGNOSIS — F32A Depression, unspecified: Secondary | ICD-10-CM

## 2016-08-14 LAB — CBC
HCT: 43.9 % (ref 40.0–52.0)
Hemoglobin: 14.5 g/dL (ref 13.0–18.0)
MCH: 30.3 pg (ref 26.0–34.0)
MCHC: 32.9 g/dL (ref 32.0–36.0)
MCV: 92.2 fL (ref 80.0–100.0)
Platelets: 185 10*3/uL (ref 150–440)
RBC: 4.77 MIL/uL (ref 4.40–5.90)
RDW: 17.1 % — ABNORMAL HIGH (ref 11.5–14.5)
WBC: 7.9 10*3/uL (ref 3.8–10.6)

## 2016-08-14 LAB — BASIC METABOLIC PANEL
Anion gap: 10 (ref 5–15)
BUN: 11 mg/dL (ref 6–20)
CO2: 22 mmol/L (ref 22–32)
Calcium: 9.1 mg/dL (ref 8.9–10.3)
Chloride: 107 mmol/L (ref 101–111)
Creatinine, Ser: 1.06 mg/dL (ref 0.61–1.24)
GFR calc Af Amer: >60 mL/min (ref >60)
GFR calc non Af Amer: >60 mL/min (ref >60)
Glucose, Bld: 111 mg/dL — ABNORMAL HIGH (ref 65–99)
Potassium: 3.2 mmol/L — ABNORMAL LOW (ref 3.5–5.1)
Sodium: 139 mmol/L (ref 135–145)

## 2016-08-14 LAB — ETHANOL: Alcohol, Ethyl (B): 238 mg/dL — ABNORMAL HIGH (ref <5)

## 2016-08-14 MED ORDER — HALOPERIDOL LACTATE 5 MG/ML IJ SOLN
INTRAMUSCULAR | Status: AC
  Administered 2016-08-14: 5 mg
  Filled 2016-08-14: qty 1

## 2016-08-14 MED ORDER — LORAZEPAM 2 MG/ML IJ SOLN
INTRAMUSCULAR | Status: AC
  Administered 2016-08-14: 2 mg
  Filled 2016-08-14: qty 1

## 2016-08-14 MED ORDER — BACITRACIN-NEOMYCIN-POLYMYXIN 400-5-5000 EX OINT
TOPICAL_OINTMENT | Freq: Once | CUTANEOUS | Status: AC
Start: 2016-08-14 — End: 2016-08-14
  Administered 2016-08-14: 1 via TOPICAL
  Filled 2016-08-14: qty 1

## 2016-08-14 MED ORDER — POTASSIUM CHLORIDE CRYS ER 20 MEQ PO TBCR
40.0000 meq | EXTENDED_RELEASE_TABLET | Freq: Once | ORAL | Status: AC
  Administered 2016-08-14: 40 meq via ORAL
  Filled 2016-08-14: qty 2

## 2016-08-14 NOTE — ED Triage Notes (Signed)
Pt via ems from home after cutting his hand at home. His wife called EMS and said that he cut himself purposely and that he was trying to hurt himself. Pt came to ED with superficial cuts to top of left hand. Pt refused to cooperate and change into wine scrubs. He stated he would not change into "jail clothes." Pt was counseled by this nurse, by Dr. Mariea Clonts, and by law enforcement, with multiple requests to change out. Pt refused. Pt given 5 haldol and 2 ativan per Dr. Mariea Clonts verbal order. Pt willingly took injections, but still refused to change clothing or allow vital signs to be taken. Pt is currently sitting on side of bed, and seems to be restless.  Pt is very concerned about his wife. He states that she is unable to care for herself and that he must care for her. States she has several chronic health problems, including cancer.   Pt not aggressive physically; very anxious and restless.

## 2016-08-14 NOTE — BH Assessment (Signed)
Assessment Note  Miguel [name] is an 55 y.o. male. Pt was brought into the ED by EMS after pt's wife called EMS and reports pt was trying to harm himself. When asked by this writer the reason pt presents to ED, pt reports "I was just ... I Marv't know ... Just sad I guess. I've been helping my wife and I just have a lot of going on". Pt reports constantly worrying about his wife and her declining health has caused pt to feel overwhelmed and depressed. He reports that his wife has been diagnosed with cancer and MS. Pt quit his job over a year ago to help care for his wife. When this writer questioned pt about the cut on his hand he reports "it was a stupid decision"; pt then denied having thoughts of SI/HI. Pt also reports lack of sleep (4-5 hours avg.), no appetite, and increased stress. He attempted to seek mental health treatment with [facility] but was told to return in 2 months (about a month ago; pt never received Tx). Pt denies hallucinations/delusions. Pt was easy to arouse upon entering room, pleasant in mood, and cooperative throughout TTS assessment. Pt was receptive to the idea of seeking outpatient individual therapy to help management his stressors and depression.  Diagnosis: Unspecified Depressive Disorder  Past Medical History:  Past Medical History:  Diagnosis Date  . Asthma   . GERD (gastroesophageal reflux disease)   . Gout   . Hypertension   . OCD (obsessive compulsive disorder)   . Renal colic     Past Surgical History:  Procedure Laterality Date  . CHOLECYSTECTOMY  2012    Family History: History reviewed. No pertinent family history.  Social History:  reports that he has quit smoking. He has never used smokeless tobacco. He reports that he drinks alcohol. He reports that he does not use drugs.  Additional Social History:  Alcohol / Drug Use Pain Medications: None Reported Prescriptions: None Reported Over the Counter: None Reported History of alcohol / drug use?:  Yes Substance #1 Name of Substance 1: Alcohol 1 - Age of First Use: 19 1 - Amount (size/oz): UKN 1 - Frequency: UKN 1 - Duration: UKN 1 - Last Use / Amount: 08/14/16  CIWA: CIWA-Ar BP: (!) 87/57 Pulse Rate: 89 COWS:    Allergies: No Known Allergies  Home Medications:  (Not in a hospital admission)  OB/GYN Status:  No LMP for male patient.  General Assessment Data Location of Assessment: [facility] ED TTS Assessment: In system Is this a Tele or Face-to-Face Assessment?: Face-to-Face Is this an Initial Assessment or a Re-assessment for this encounter?: Initial Assessment Marital status: Married (33 Years) Maiden name: N/A Is patient pregnant?: No Pregnancy Status: No Living Arrangements: Spouse/significant other Can pt return to current living arrangement?: Yes Admission Status: Involuntary Is patient capable of signing voluntary admission?: No Referral Source: Self/Family/Friend Insurance type: Conservation officer, nature  Medical Screening Exam ([place]) Medical Exam completed: Yes  Crisis Care Plan Living Arrangements: Spouse/significant other Legal Guardian: Other: (Self) Name of Psychiatrist: None Name of Therapist: None  Education Status Is patient currently in school?: No Current Grade: N/A Highest grade of school patient has completed: High School Name of school: N/A Contact person: N/A  Risk to self with the past 6 months Suicidal Ideation: No Has patient been a risk to self within the past 6 months prior to admission? : No Suicidal Intent: No Has patient had any suicidal intent within the past 6 months  prior to admission? : No Is patient at risk for suicide?: Yes (Pt denies SI; however, pt cut his arm earlier today.) Suicidal Plan?: No Has patient had any suicidal plan within the past 6 months prior to admission? : No Access to Means: No What has been your use of drugs/alcohol within the last 12 months?: Alcohol - BAC .238 Previous  Attempts/Gestures: No How many times?: 0 Other Self Harm Risks: Pt reports cutting his arm with a kitchen knife Triggers for Past Attempts: None known Intentional Self Injurious Behavior: Cutting Comment - Self Injurious Behavior: Cut to left wrist (reports this to be "unintentional") Family Suicide History: Unknown Recent stressful life event(s): Other (Comment) (Pt reports his wife's declining health has been stressful) Persecutory voices/beliefs?: No Depression: Yes Depression Symptoms: Guilt, Insomnia, Feeling worthless/self pity, Feeling angry/irritable Substance abuse history and/or treatment for substance abuse?: Yes Suicide prevention information given to non-admitted patients: Yes  Risk to Others within the past 6 months Homicidal Ideation: No Does patient have any lifetime risk of violence toward others beyond the six months prior to admission? : No Thoughts of Harm to Others: No Current Homicidal Intent: No Current Homicidal Plan: No Access to Homicidal Means: No Identified Victim: N/A History of harm to others?: No Assessment of Violence: None Noted Violent Behavior Description: N/A Does patient have access to weapons?: No Criminal Charges Pending?: No Does patient have a court date: No Is patient on probation?: No  Psychosis Hallucinations: None noted Delusions: None noted  Mental Status Report Appearance/Hygiene: In scrubs, In hospital gown Eye Contact: Good Motor Activity: Freedom of movement Speech: Logical/coherent Level of Consciousness: Alert Mood: Depressed Affect: Flat Anxiety Level: Minimal Thought Processes: Coherent, Relevant Judgement: Partial Orientation: Person, Place, Time, Situation, Appropriate for developmental age Obsessive Compulsive Thoughts/Behaviors: None  Cognitive Functioning Concentration: Normal Memory: Recent Intact, Remote Intact IQ: Average Insight: Fair Impulse Control: Fair Appetite: Fair (Reports lack of appetite  today) Weight Loss: 0 Weight Gain: 0 Sleep: Decreased Total Hours of Sleep: 5 Vegetative Symptoms: None  ADLScreening [facility]) Patient's cognitive ability adequate to safely complete daily activities?: Yes Patient able to express need for assistance with ADLs?: Yes Independently performs ADLs?: Yes (appropriate for developmental age)  Prior Inpatient Therapy Prior Inpatient Therapy: No Prior Therapy Dates: N/A Prior Therapy Facilty/Provider(s): N/A Reason for Treatment: N/A  Prior Outpatient Therapy Prior Outpatient Therapy: No Prior Therapy Dates: N/A Prior Therapy Facilty/Provider(s): N/A Reason for Treatment: N/A Does patient have an ACCT team?: No Does patient have Intensive In-House Services?  : No Does patient have [facility] services? : No Does patient have P4CC services?: No  ADL Screening (condition at time of admission) Patient's cognitive ability adequate to safely complete daily activities?: Yes Patient able to express need for assistance with ADLs?: Yes Independently performs ADLs?: Yes (appropriate for developmental age)       Abuse/Neglect Assessment (Assessment to be complete while patient is alone) Physical Abuse: Denies Verbal Abuse: Denies Sexual Abuse: Denies Exploitation of patient/patient's resources: Denies Self-Neglect: Denies Values / Beliefs Cultural Requests During Hospitalization: None Spiritual Requests During Hospitalization: None Consults Spiritual Care Consult Needed: No Social Work Consult Needed: No Regulatory affairs officer (For Healthcare) Does patient have an advance directive?: No    Additional Information 1:1 In Past 12 Months?: No CIRT Risk: No Elopement Risk: No Does patient have medical clearance?: Yes  Child/Adolescent Assessment Running Away Risk: Denies Bed-Wetting: Denies Destruction of Property: Denies Cruelty to Animals: Denies Stealing: Denies Rebellious/Defies Authority: Denies Satanic  Involvement:  Denies Fire Setting: Denies Problems at [employer]: Denies Gang Involvement: Denies  Disposition:  Disposition Initial Assessment Completed for this Encounter: Yes Disposition of Patient: Referred to (Psych MD to see) Patient referred to: Other (Comment) (Psych MD to see)  On Site Evaluation by:   Reviewed with Physician:    Frederich Cha 08/14/2016 9:47 PM

## 2016-08-14 NOTE — ED Notes (Signed)
Pt. Given meal tray and something to drink upon request.

## 2016-08-14 NOTE — ED Notes (Signed)
Pt. Requested by this this to change into scrubs.  Pt. Agreed to change into burgundy scrubs and give blood specimen.

## 2016-08-14 NOTE — ED Provider Notes (Signed)
[facility] Emergency Department Provider Note  ____________________________________________  Time seen: Approximately 6:43 PM  I have reviewed the triage vital signs and the nursing notes.   HISTORY  Chief Complaint No chief complaint on file.    HPI Miguel [name] is a 55 y.o. male with a history of depression and OCD not on medications, HTN, presenting with self cutting and depression. On arrival to the emergency department the patient is agitated and only intermittently able to be calmed by verbal interaction. He reports significant stress with a sick wife, no job, and only $700. In the past, he has had self cutting remotely, and tonight was feeling bad so cut the dorsum of his left hand. His last tetanus shot was less than 5 years ago. He will not answer questions about SI, HI or hallucinations.   Past Medical History:  Diagnosis Date  . Asthma   . GERD (gastroesophageal reflux disease)   . Gout   . Hypertension   . OCD (obsessive compulsive disorder)   . Renal colic     Patient Active Problem List   Diagnosis Date Noted  . Alcohol abuse 02/05/2016  . Hypertension 12/05/2015  . Tachycardia 12/05/2015  . Gout 11/13/2015  . Chronic back pain 05/02/2015    Past Surgical History:  Procedure Laterality Date  . CHOLECYSTECTOMY  2012    Current Outpatient Rx  . Order #: [id number] Class: Historical Med  . Order #: [id number] Class: Historical Med  . Order #: [id number] Class: Print  . Order #: [id number] Class: Print  . Order #: [id number] Class: Print  . Order #: [id number] Class: Historical Med  . Order #: [id number] Class: Historical Med  . Order #: [id number] Class: Print  . Order #: [id number] Class: Historical Med  . Order #: [id number] Class: Historical Med  . Order #: [id number] Class: Historical Med  . Order #: [id number] Class: Print  . Order #: [id number] Class: Print  . Order #: [id number] Class: Print  . Order #: [id number] Class: Print  . Order #:  [id number] Class: Historical Med  . Order #: [id number] Class: Normal  . Order #: [id number] Class: Print  . Order #: [id number] Class: Historical Med  . Order #: [id number] Class: Print  . Order #: [id number] Class: Print  . Order #: [id number] Class: Historical Med  . Order #: [id number] Class: Print  . Order #: [id number] Class: Print  . Order #: [id number] Class: Print    Allergies Review of patient's allergies indicates no known allergies.  No family history on file.  Social History Social History  Substance Use Topics  . Smoking status: Former Research scientist (life sciences)  . Smokeless tobacco: Never Used  . Alcohol use Yes    Review of Systems Constitutional: No fever/chills.No lightheadedness or syncope. Eyes: No visual changes. ENT: No sore throat. No congestion or rhinorrhea. Cardiovascular: Denies chest pain. Denies palpitations. Respiratory: Denies shortness of breath.  No cough. Gastrointestinal: No abdominal pain.  No nausea, no vomiting.  No diarrhea.  No constipation. Genitourinary: Negative for dysuria. Musculoskeletal: Negative for back pain. Skin: Negative for rash.  + lacerations Neurological: Negative for headaches. No focal numbness, tingling or weakness.  Psychiatric:Positive self cutting. Positive depression. Will not answer questions about SI, HI or hallucinations.  10-point ROS otherwise negative.  ____________________________________________   PHYSICAL EXAM:  VITAL SIGNS: ED Triage Vitals  Enc Vitals Group     BP      Pulse      Resp      Temp      Temp src      SpO2      Weight  Height      Head Circumference      Peak Flow      Pain Score      Pain Loc      Pain Edu?      Excl. in [place]?     Constitutional: Alert and oriented. Well appearing and in no acute distress. Agitated, makes poor eye contact. Eyes: Conjunctivae are normal.  EOMI. No scleral icterus. Head: Atraumatic. Nose: No congestion/rhinnorhea. Mouth/Throat: Mucous membranes are moist.  Neck: No  stridor.  Supple.  No JVD. No meningismus. Cardiovascular: Normal rate, regular rhythm. No murmurs, rubs or gallops.  Respiratory: Normal respiratory effort.  No accessory muscle use or retractions. Lungs CTAB.  No wheezes, rales or ronchi. Gastrointestinal: Soft, nontender and nondistended.  No guarding or rebound.  No peritoneal signs. Musculoskeletal: Moves all extremities well.  Several linear superficial lacerations on the dorsum of the left hand.  The pt is unwilling to comply with a full motor exam with the left hand, but I am able to see his flex and extend all his digits while I am interviewing him, and there are no obvious cut tendons on my examination. Neurologic:  A&Ox3.  Speech is clear.  Face and smile are symmetric.  EOMI.  Moves all extremities well. Skin:  Skin is warm, dry and intact. No rash noted. Psychiatric: Depressed mood with flat affect. Agitated. Unwilling to comply with standard emergency department procedures. Poor eye contact. Poor insight.  ____________________________________________   LABS (all labs ordered are listed, but only abnormal results are displayed)  Labs Reviewed  CBC  BASIC METABOLIC PANEL  URINE DRUG SCREEN, QUALITATIVE ([facility] ONLY)  ETHANOL   ____________________________________________  EKG  Not indicated ____________________________________________  RADIOLOGY  No results found.  ____________________________________________   PROCEDURES  Procedure(s) performed: None  Procedures  Critical Care performed: No ____________________________________________   INITIAL IMPRESSION / ASSESSMENT AND PLAN / ED COURSE  Pertinent labs & imaging results that were available during my care of the patient were reviewed by me and considered in my medical decision making (see chart for details).  55 y.o. male with a history of depression and OCD presenting with lacerations on the posterior left hand in the setting of depression. The patient  has both agitation and also poor insight, and will not reliably answer my questions about suicidality or homicidality, nor hallucinations. At this time, his lacerations can be cleaned and treated with Neosporin, there is no evidence of tendon injury, nor a depth which requires laceration repair with sutures. He has been involuntarily committed, and TTS and psychiatry have been consult it. Awaiting laboratory results for medical clearance.  ----------------------------------------- 8:02 PM on 08/14/2016 -----------------------------------------  The patient had a significant amount of agitation required sedation with Haldol and Ativan. The nurses just informed me that his blood pressure is in the high 80s, but he is able to converse and mentating normally. We will give the patient some fluid, and recheck his blood pressure. If it continues to be low, we'll place an IV for fluid rehydration while we await the occasions to wear off. There do not appear to be any other signs or symptoms of cardiogenic or infectious reasons why this patient would be hypotensive. ____________________________________________  FINAL CLINICAL IMPRESSION(S) / ED DIAGNOSES  Final diagnoses:  Deliberate self-cutting  Depression  Stress    Clinical Course      NEW MEDICATIONS STARTED DURING THIS VISIT:  New Prescriptions   No medications on file  Eula Listen, MD 08/14/16 2004

## 2016-08-15 DIAGNOSIS — F1994 Other psychoactive substance use, unspecified with psychoactive substance-induced mood disorder: Secondary | ICD-10-CM

## 2016-08-15 DIAGNOSIS — Z046 Encounter for general psychiatric examination, requested by authority: Secondary | ICD-10-CM

## 2016-08-15 MED ORDER — PANTOPRAZOLE SODIUM 40 MG PO TBEC
40.0000 mg | DELAYED_RELEASE_TABLET | Freq: Every day | ORAL | Status: DC
  Administered 2016-08-15: 40 mg via ORAL
  Filled 2016-08-15: qty 1

## 2016-08-15 MED ORDER — MOMETASONE FURO-FORMOTEROL FUM 100-5 MCG/ACT IN AERO
2.0000 | INHALATION_SPRAY | Freq: Two times a day (BID) | RESPIRATORY_TRACT | Status: DC
  Administered 2016-08-15: 2 via RESPIRATORY_TRACT
  Filled 2016-08-15: qty 8.8

## 2016-08-15 MED ORDER — ALLOPURINOL 300 MG PO TABS
300.0000 mg | ORAL_TABLET | ORAL | Status: DC
  Administered 2016-08-15: 300 mg via ORAL
  Filled 2016-08-15: qty 1

## 2016-08-15 MED ORDER — LORAZEPAM 2 MG PO TABS
0.0000 mg | ORAL_TABLET | Freq: Four times a day (QID) | ORAL | Status: DC
  Administered 2016-08-15: 2 mg via ORAL
  Filled 2016-08-15: qty 1

## 2016-08-15 MED ORDER — FLUOXETINE HCL 20 MG PO CAPS: 20.0000 mg | ORAL_CAPSULE | Freq: Every day | ORAL | 1 refills | Status: DC

## 2016-08-15 MED ORDER — ALBUTEROL SULFATE HFA 108 (90 BASE) MCG/ACT IN AERS
2.0000 | INHALATION_SPRAY | RESPIRATORY_TRACT | Status: DC | PRN
  Filled 2016-08-15: qty 6.7

## 2016-08-15 MED ORDER — LISINOPRIL 20 MG PO TABS
20.0000 mg | ORAL_TABLET | ORAL | Status: DC
  Administered 2016-08-15: 20 mg via ORAL
  Filled 2016-08-15: qty 1

## 2016-08-15 MED ORDER — NAPROXEN 500 MG PO TABS
500.0000 mg | ORAL_TABLET | Freq: Once | ORAL | Status: AC
  Administered 2016-08-15: 500 mg via ORAL
  Filled 2016-08-15: qty 1

## 2016-08-15 MED ORDER — LORAZEPAM 2 MG PO TABS: 0.0000 mg | ORAL_TABLET | Freq: Two times a day (BID) | ORAL | Status: DC

## 2016-08-15 MED ORDER — COLCHICINE 0.6 MG PO TABS
0.6000 mg | ORAL_TABLET | Freq: Every day | ORAL | Status: DC
  Administered 2016-08-15: 0.6 mg via ORAL
  Filled 2016-08-15: qty 1

## 2016-08-15 NOTE — ED Notes (Signed)
City transit information given.

## 2016-08-15 NOTE — ED Notes (Signed)
Call to pharmacy and pharmacy tech for home med reconciliation.

## 2016-08-15 NOTE — ED Notes (Signed)
BEHAVIORAL HEALTH ROUNDING Patient sleeping: Yes.   Patient alert and oriented: not applicable SLEEPING Behavior appropriate: Yes.  ; If no, describe: SLEEPING Nutrition and fluids offered: No SLEEPING Toileting and hygiene offered: NoSLEEPING Sitter present: not applicable, Q 15 min safety rounds and observation via security camera. Law enforcement present: Yes ODS 

## 2016-08-15 NOTE — ED Provider Notes (Signed)
-----------------------------------------   11:56 AM on 08/15/2016 -----------------------------------------  Dr. Weber Cooks of psychiatry has evaluated the patient, lifted IVC, recommends discharge with [facility] follow up. DC home.   Joanne Gavel, MD 08/15/16 [phone]

## 2016-08-15 NOTE — Clinical Social Work Note (Signed)
Clinical Social Work Assessment  Patient Details  Name: Miguel [name] MRN: [id number] Date of Birth: 1961-02-05  Date of referral:  08/15/16               Reason for consult:  Emotional/Coping/Adjustment to Illness                Permission sought to share information with:  Family Supports Permission granted to share information::  Yes, Verbal Permission Granted  Name::       Agency::     Relationship::     Contact Information:     Housing/Transportation Living arrangements for the past 2 months:  Single Family Home Source of Information:  Patient Patient Interpreter Needed:  None Criminal Activity/Legal Involvement Pertinent to Current Situation/Hospitalization:    Significant Relationships:  Spouse Lives with:  Spouse Do you feel safe going back to the place where you live?  Yes Need for family participation in patient care:  Yes (Comment)  Care giving concerns: Wife concerned about his drinking and poor coping skills   Social Worker assessment / plan:  LCSW met with patient he reports he is not suicidal or homicidal. He reports he was feeling depressed and self injured. He is currently attached to [facility] and has a counseling appointment in [facility] October. He has completed his medicaid application and will follow up with the DSS office. He reports he accesses local and church food banks and receives food stamps to supplement his grocery bills for his wife and himself.  He is the sole care giver for his wife( MS.Lympodemia and breast cancer survivor) and new in -  home supports have been arranged starting next week whereby in home service will commence 2x week. She has also had in home PT extended and they will come 2x week.  Patient is remorseful and will follow up with his medicaid application , [facility] and [facility] doctor about his breathing issues, and will see [facility]- Therapist in a couple of weeks, he reports he is very embarrassed that he wasn't coping well with his life  situation but wants to return home ASAP, he reports this is not his usual behaviour.  Resources provided: Family Care Giver resource booklet [employer] engineer for DSS/Food banks/Free Medication Meals on wheels handout   Employment status:  [place] information:   Medicaid pending PT Recommendations:  Not assessed at this time Information / Referral to community resources:     Patient/Family's Response to care: Understands the need for self care and therapy  Patient/Family's Understanding of and Emotional Response to Diagnosis, Current Treatment, and Prognosis: Will follow up with [facility]  Emotional Assessment Appearance:  Appears stated age Attitude/Demeanor/Rapport:   (Polite calm) Affect (typically observed):  Accepting, Adaptable, Calm Orientation:  Oriented to Self, Oriented to Place, Oriented to  Time, Oriented to Situation Alcohol / Substance use:  Alcohol Use Psych involvement (Current and /or in the community):  Outpatient Provider (Connected to [facility])  Discharge Needs  Concerns to be addressed:  No discharge needs identified Readmission within the last 30 days:  No Current discharge risk:  None Barriers to Discharge:  Active Substance Use   Joana Reamer, LCSW 08/15/2016, 10:47 AM

## 2016-08-15 NOTE — Consult Note (Signed)
[facility] Consult   Reason for Consult:  Consult for 55 year old man brought into the hospital after self-inflicted cut to his hand Referring Physician:  Edd Fabian Patient Identification: Miguel [name] MRN:  [id number] Principal Diagnosis: Substance induced mood disorder [facility]) Diagnosis:   Patient Active Problem List   Diagnosis Date Noted  . Moderate major depression, single episode ([place]) [F32.1] 08/15/2016  . Substance induced mood disorder ([place]) [F19.94] 08/15/2016  . Self-inflicted injury [W09.8] 08/15/2016  . Involuntary commitment [Z04.6] 08/15/2016  . Alcohol abuse [F10.10] 02/05/2016  . Hypertension [I10] 12/05/2015  . Tachycardia [R00.0] 12/05/2015  . Gout [M10.9] 11/13/2015  . Chronic back pain [M54.9, G89.29] 05/02/2015    Total Time spent with patient: 1 hour  Subjective:   Miguel [name] is a 55 y.o. male patient admitted with "I cut my hand".  HPI:  Patient interviewed. Chart reviewed. Labs and vitals reviewed. 55 year old man says he was sitting around at home feeling down and sad yesterday. He also admitted that he had been drinking more than usual. He had a knife in his hand and cut himself on the dorsum of his left hand. Once he saw how much it was bleeding he went to the fire station nearby and got help and they brought him to the hospital. Patient says his mood is been sad and depressed. A lot of this is related to the stress of living with his wife who is medically very disabled. Additionally he is having chronic pain recently from an injury to his ribs. Sleep is poor at night in part because of the pain in his ribs. Appetite is adequate. He denies that he wanted to die or wanted to kill himself and denies having any actual suicidal thoughts. No homicidal thoughts. No hallucinations. Feels down and low quite a bit. Also admits that he has been drinking more than usual. Several times a week he is drinking several drinks of liquor. Patient has gone to [facility] to make  an appointment but has not had a full evaluation yet. Not currently taking any psychiatric medicine. Her [place]  Medical history: Injury to the ribs but apparently not a break. Not taking any pain medicine yet. Denies being aware of any other ongoing medical problems.  Substance abuse history: Admits that he's had long-term problems with intermittent use of alcohol to excess. He's been able to stop for extended periods in the past. Not involved in any kind of current substance abuse treatment. Denies any other drug use.  Social history: Wife has multiple severe medical problems and requires a lot of one-to-one care. 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 Finances are difficult. Feels very stressed out.  Past Psychiatric History: No previous psychiatric hospitalization. He's cut himself a couple times in the past but it has been a while. No other suicide attempts. Has never been on any psychiatric medicine.  Risk to Self: Suicidal Ideation: No Suicidal Intent: No Is patient at risk for suicide?: Yes (Pt denies SI; however, pt cut his arm earlier today.) Suicidal Plan?: No Access to Means: No What has been your use of drugs/alcohol within the last 12 months?: Alcohol - BAC .238 How many times?: 0 Other Self Harm Risks: Pt reports cutting his arm with a kitchen knife Triggers for Past Attempts: None known Intentional Self Injurious Behavior: Cutting Comment - Self Injurious Behavior: Cut to left wrist (reports this to be "unintentional") Risk to Others: Homicidal  Ideation: No Thoughts of Harm to Others: No Current Homicidal Intent: No Current Homicidal Plan: No Access to Homicidal Means: No Identified Victim: N/A History of harm to others?: No Assessment of Violence: None Noted Violent Behavior Description: N/A Does patient have access to weapons?: No Criminal Charges Pending?: No Does patient have a court date: No Prior Inpatient Therapy: Prior  Inpatient Therapy: No Prior Therapy Dates: N/A Prior Therapy Facilty/Provider(s): N/A Reason for Treatment: N/A Prior Outpatient Therapy: Prior Outpatient Therapy: No Prior Therapy Dates: N/A Prior Therapy Facilty/Provider(s): N/A Reason for Treatment: N/A Does patient have an ACCT team?: No Does patient have Intensive In-House Services?  : No Does patient have [facility] services? : No Does patient have P4CC services?: No  Past Medical History:  Past Medical History:  Diagnosis Date  . Asthma   . GERD (gastroesophageal reflux disease)   . Gout   . Hypertension   . OCD (obsessive compulsive disorder)   . Renal colic     Past Surgical History:  Procedure Laterality Date  . CHOLECYSTECTOMY  2012   Family History: History reviewed. No pertinent family history. Family Psychiatric  History: Denies any family history of mental health or substance abuse problems Social History:  History  Alcohol Use  . Yes     History  Drug Use No    Social History   Social History  . Marital status: Married    Spouse name: N/A  . Number of children: N/A  . Years of education: N/A   Social History Main Topics  . Smoking status: Former Research scientist (life sciences)  . Smokeless tobacco: Never Used  . Alcohol use Yes  . Drug use: No  . Sexual activity: Not Asked   Other Topics Concern  . None   Social History Narrative  . None   Additional Social History:    Allergies:  No Known Allergies  Labs:  Results for orders placed or performed during the hospital encounter of 08/14/16 (from the past 48 hour(s))  CBC     Status: Abnormal   Collection Time: 08/14/16  7:32 PM  Result Value Ref Range   WBC 7.9 3.8 - 10.6 K/uL   RBC 4.77 4.40 - 5.90 MIL/uL   Hemoglobin 14.5 13.0 - 18.0 g/dL   HCT 43.9 40.0 - 52.0 %   MCV 92.2 80.0 - 100.0 fL   MCH 30.3 26.0 - 34.0 pg   MCHC 32.9 32.0 - 36.0 g/dL   RDW 17.1 (H) 11.5 - 14.5 %   Platelets 185 150 - 440 K/uL  Basic metabolic panel     Status: Abnormal    Collection Time: 08/14/16  7:32 PM  Result Value Ref Range   Sodium 139 135 - 145 mmol/L   Potassium 3.2 (L) 3.5 - 5.1 mmol/L   Chloride 107 101 - 111 mmol/L   CO2 22 22 - 32 mmol/L   Glucose, Bld 111 (H) 65 - 99 mg/dL   BUN 11 6 - 20 mg/dL   Creatinine, Ser 1.06 0.61 - 1.24 mg/dL   Calcium 9.1 8.9 - 10.3 mg/dL   GFR calc non Af Amer >60 >60 mL/min   GFR calc Af Amer >60 >60 mL/min    Comment: (NOTE) The eGFR has been calculated using the CKD EPI equation. This calculation has not been validated in all clinical situations. eGFR's persistently <60 mL/min signify possible Chronic Kidney Disease.    Anion gap 10 5 - 15  Ethanol     Status: Abnormal   Collection  Time: 08/14/16  7:32 PM  Result Value Ref Range   Alcohol, Ethyl (B) 238 (H) <5 mg/dL    Comment:        LOWEST DETECTABLE LIMIT FOR SERUM ALCOHOL IS 5 mg/dL FOR MEDICAL PURPOSES ONLY     Current Facility-Administered Medications  Medication Dose Route Frequency Provider Last Rate Last Dose  . albuterol (PROVENTIL HFA;VENTOLIN HFA) 108 (90 Base) MCG/ACT inhaler 2 puff  2 puff Inhalation Q4H PRN Joanne Gavel, MD      . allopurinol (ZYLOPRIM) tablet 300 mg  300 mg Oral BH-q7a Joanne Gavel, MD   300 mg at 08/15/16 1033  . colchicine tablet 0.6 mg  0.6 mg Oral Daily Joanne Gavel, MD   0.6 mg at 08/15/16 1031  . lisinopril (PRINIVIL,ZESTRIL) tablet 20 mg  20 mg Oral BH-q7a Joanne Gavel, MD   20 mg at 08/15/16 1031  . LORazepam (ATIVAN) tablet 0-4 mg  0-4 mg Oral Q6H Nena Polio, MD       Followed by  . [START ON 08/17/2016] LORazepam (ATIVAN) tablet 0-4 mg  0-4 mg Oral Q12H Nena Polio, MD      . mometasone-formoterol [facility]) 100-5 MCG/ACT inhaler 2 puff  2 puff Inhalation BID Joanne Gavel, MD   2 puff at 08/15/16 1031  . pantoprazole (PROTONIX) EC tablet 40 mg  40 mg Oral Daily Joanne Gavel, MD   40 mg at 08/15/16 1031   Current Outpatient Prescriptions  Medication Sig Dispense Refill  . albuterol (VENTOLIN  HFA) 108 (90 Base) MCG/ACT inhaler Inhale 2 puffs into the lungs every 4 (four) hours as needed for wheezing or shortness of breath.    . allopurinol (ZYLOPRIM) 300 MG tablet Take 300 mg by mouth every morning.    . chlordiazePOXIDE (LIBRIUM) 10 MG capsule Take 1 capsule (10 mg total) by mouth 3 (three) times daily as needed for anxiety or withdrawal. 10 capsule 0  . dexlansoprazole (DEXILANT) 60 MG capsule Take 60 mg by mouth every morning.    . Fluticasone-Salmeterol (ADVAIR) 100-50 MCG/DOSE AEPB Inhale 1 puff into the lungs 2 (two) times daily.    Marland Kitchen lidocaine (LIDODERM) 5 % Place 1 patch onto the skin every 12 (twelve) hours. Remove & Discard patch within 12 hours or as directed by MD 10 patch 0  . lisinopril (PRINIVIL,ZESTRIL) 20 MG tablet Take 20 mg by mouth every morning.     . naproxen (NAPROSYN) 500 MG tablet Take 0.5 tablets (250 mg total) by mouth 2 (two) times daily with a meal. 5 tablet 0  . colchicine 0.6 MG tablet 1 tablet once or twice a day with food 40 tablet 2  . cyclobenzaprine (FLEXERIL) 5 MG tablet Take 1 tablet (5 mg total) by mouth 3 (three) times daily as needed for muscle spasms. (Patient not taking: Reported on 08/15/2016) 15 tablet 0  . etodolac (LODINE) 200 MG capsule Take 1 capsule (200 mg total) by mouth every 8 (eight) hours. (Patient not taking: Reported on 08/15/2016) 12 capsule 0  . FLUoxetine (PROZAC) 20 MG capsule Take 1 capsule (20 mg total) by mouth daily. 30 capsule 1  . HYDROcodone-acetaminophen (NORCO/VICODIN) 5-325 MG tablet Take 1-2 tablets by mouth every 4 (four) hours as needed for moderate pain. (Patient not taking: Reported on 08/15/2016) 15 tablet 0  . ibuprofen (ADVIL,MOTRIN) 600 MG tablet Take 1 tablet (600 mg total) by mouth every 6 (six) hours as needed. (Patient not taking: Reported on 08/15/2016) 30  tablet 0  . ketorolac (TORADOL) 10 MG tablet Take 1 tablet (10 mg total) by mouth every 8 (eight) hours. (Patient not taking: Reported on 08/15/2016) 15  tablet 0  . LORazepam (ATIVAN) 1 MG tablet Take 1 tablet (1 mg total) by mouth 2 (two) times daily as needed for anxiety. (Patient not taking: Reported on 04/30/2016) 10 tablet 0  . naproxen (EC NAPROSYN) 500 MG EC tablet Take 1 tablet (500 mg total) by mouth 2 (two) times daily with a meal. (Patient not taking: Reported on 08/15/2016) 30 tablet 0  . oxyCODONE-acetaminophen (ROXICET) 5-325 MG tablet Take 1 tablet by mouth every 6 (six) hours as needed. (Patient not taking: Reported on 08/15/2016) 12 tablet 0  . potassium chloride (K-DUR) 10 MEQ tablet Take 1 tablet (10 mEq total) by mouth daily. (Patient not taking: Reported on 08/15/2016) 90 tablet 0  . predniSONE (DELTASONE) 20 MG tablet Take 3 tablets (60 mg total) by mouth daily. (Patient not taking: Reported on 08/15/2016) 15 tablet 0    Musculoskeletal: Strength & Muscle Tone: within normal limits Gait & Station: normal Patient leans: N/A  Psychiatric Specialty Exam: Physical Exam  Nursing note and vitals reviewed. Constitutional: He appears well-developed and well-nourished.  HENT:  Head: Normocephalic and atraumatic.  Eyes: Conjunctivae are normal. Pupils are equal, round, and reactive to light.  Neck: Normal range of motion.  Cardiovascular: Normal heart sounds.   Respiratory: Effort normal.  GI: Soft.  Musculoskeletal: Normal range of motion.  Neurological: He is alert.  Skin: Skin is warm and dry.     Psychiatric: His affect is blunt. His speech is delayed. He is slowed. Thought content is not paranoid. He expresses impulsivity. He expresses no suicidal ideation. He exhibits abnormal recent memory.    Review of Systems  Constitutional: Positive for malaise/fatigue.  HENT: Negative.   Eyes: Negative.   Respiratory: Negative.   Cardiovascular: Negative.   Gastrointestinal: Negative.   Musculoskeletal: Negative.        Patient has pain to his lower sternum from a recent injury. Tender to the touch and also sore when he  breathes.  Skin: Negative.   Neurological: Negative.   Psychiatric/Behavioral: Positive for depression and substance abuse. Negative for hallucinations, memory loss and suicidal ideas. The patient is nervous/anxious and has insomnia.     Blood pressure (!) 139/91, pulse 92, temperature 98.4 F (36.9 C), temperature source Oral, resp. rate 18, SpO2 98 %.There is no height or weight on file to calculate BMI.  General Appearance: Casual  Eye Contact:  Fair  Speech:  Slow  Volume:  Decreased  Mood:  Dysphoric  Affect:  Congruent  Thought Process:  Goal Directed  Orientation:  Full (Time, Place, and Person)  Thought Content:  Logical  Suicidal Thoughts:  No  Homicidal Thoughts:  No  Memory:  Immediate;   Good Recent;   Good Remote;   Good  Judgement:  Fair  Insight:  Fair  Psychomotor Activity:  Decreased  Concentration:  Concentration: Fair  Recall:  [place] of Knowledge:  Fair  Language:  Fair  Akathisia:  No  Handed:  Right  AIMS (if indicated):     Assets:  Communication Skills Desire for Improvement Financial Resources/Insurance Housing Physical Health Resilience  ADL's:  Intact  Cognition:  WNL  Sleep:        Treatment Plan Summary: Medication management and Plan 55 year old man who had a self-inflicted superficial cut to his hand while intoxicated. Now sober. Not delirious  no sign of serious withdrawal. Does not require hospital level treatment. Discontinue involuntary commitment. Reviewed plan with patient and he will go back to [facility] to get involved with mental health and substance abuse treatment. I suggested that we go ahead and start him on an antidepressant. He is agreeable to f fluoxetine 20 mg once a day and a prescription with a refill was given. Reviewed plan with emergency room physician. Patient can be discharged to follow-up locally.  Disposition: Patient does not meet criteria for psychiatric inpatient admission. Supportive therapy provided about  ongoing stressors.  Alethia Berthold, MD 08/15/2016 11:52 AM

## 2016-08-15 NOTE — Progress Notes (Signed)
LCSW met with patient he reports he is not suicidal or homicidal. He reports he was feeling depressed and self injured. He is currently attached to [place] [facility] and has a counseling appointment in [facility] October. He has completed his medicaid application and will follow up with the DSS office. He reports he accesses local and church food banks and receives food stamps to supplement his grocery bills for his wife and himself.  He is the sole care giver for his wife( MS.Lympodemia and breast cancer survivor) and new in -  home supports have been arranged starting next week whereby in home service will commence 2x week. She has also had in home PT extended and they will come 2x week.  Patient is remorseful and will follow up with his medicaid application , [facility] and [facility] doctor about his breathing issues, and will see [place]- Therapist in a couple of weeks, he reports he is very embarrassed that he wasn't coping well with his life situation but wants to return home ASAP, he reports this is not his usual behaviour.  Resources provided: [facility] Giver resource booklet [employer] engineer for DSS/Food banks/Free [employer] on wheels handout   [place] LCSW [phone]

## 2016-08-15 NOTE — ED Notes (Signed)
Pt brought into ED BHU via sally port and wand with metal detector for safety by ODS officer. Patient oriented to unit/care area: Pt informed of unit policies and procedures.  Informed that, for their safety, care areas are designed for safety and monitored by security cameras at all times; and visiting hours explained to patient. Patient verbalizes understanding, and verbal contract for safety obtained.Pt shown to their room.   BEHAVIORAL HEALTH ROUNDING  Patient sleeping: No.  Patient alert and oriented: yes  Behavior appropriate: Yes. ; If no, describe:  Nutrition and fluids offered: Yes  Toileting and hygiene offered: Yes  Sitter present: not applicable, Q 15 min safety rounds and observation via security camera. Law enforcement present: Yes ODS  ENVIRONMENTAL ASSESSMENT  Potentially harmful objects out of patient reach: Yes.  Personal belongings secured: Yes.  Patient dressed in hospital provided attire only: Yes.  Plastic bags out of patient reach: Yes.  Patient care equipment (cords, cables, call bells, lines, and drains) shortened, removed, or accounted for: Yes.  Equipment and supplies removed from bottom of stretcher: Yes.  Potentially toxic materials out of patient reach: Yes.  Sharps container removed or out of patient reach: Yes.   ED BHU [place]  Is the patient under IVC or is there intent for IVC: Yes.  Is the patient medically cleared: Yes.  Is there vacancy in the ED BHU: Yes.  Is the population mix appropriate for patient: Yes.  Is the patient awaiting placement in inpatient or outpatient setting: Yes.  Has the patient had a psychiatric consult: pending Survey of unit performed for contraband, proper placement and condition of furniture, tampering with fixtures in bathroom, shower, and each patient room: Yes. ; Findings: All clear  APPEARANCE/BEHAVIOR  calm, cooperative and adequate rapport can be established  NEURO ASSESSMENT  Orientation: time,  place and person  Hallucinations: No.None noted (Hallucinations)  Speech: Normal  Gait: normal  RESPIRATORY ASSESSMENT  WNL  CARDIOVASCULAR ASSESSMENT  WNL  GASTROINTESTINAL ASSESSMENT  WNL  EXTREMITIES  WNL  PLAN OF CARE  Provide calm/safe environment. Vital signs assessed twice daily. ED BHU Assessment once each 12-hour shift. Collaborate with TTS daily or as condition indicates. Assure the ED provider has rounded once each shift. Provide and encourage hygiene. Provide redirection as needed. Assess for escalating behavior; address immediately and inform ED provider.  Assess family dynamic and appropriateness for visitation as needed: Yes. ; If necessary, describe findings:  Educate the patient/family about BHU procedures/visitation: Yes. ; If necessary, describe findings: Pt is calm and cooperative at this time. Pt understanding and accepting of unit procedures/rules. Will continue to monitor with Q 15 min safety rounds and observation via security camera.

## 2016-08-15 NOTE — ED Notes (Signed)
Patient states he is ready to go home and that he had a temporary crisis after drinking that precipitated admission to hospital.  He adamantly denies SI/HI.  C/O bilateral rib pain.  Reports stressors at home due to wife's illness.  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  Support offered. POC discussed.

## 2016-09-19 IMAGING — CR DG ANKLE COMPLETE 3+V*L*
1 series · 3 of 3 positions shown · non-contrast
Comparison: Ankle radiograph 10/31/2013

CLINICAL DATA: Patient with left lateral ankle pain and swelling
for 5 days. History of gout.

EXAM:
LEFT ANKLE COMPLETE - 3+ VIEW

[Series 1: x ankle ap left · 0.14mm/px · 3 of 3 slices shown]
[im 1/3]
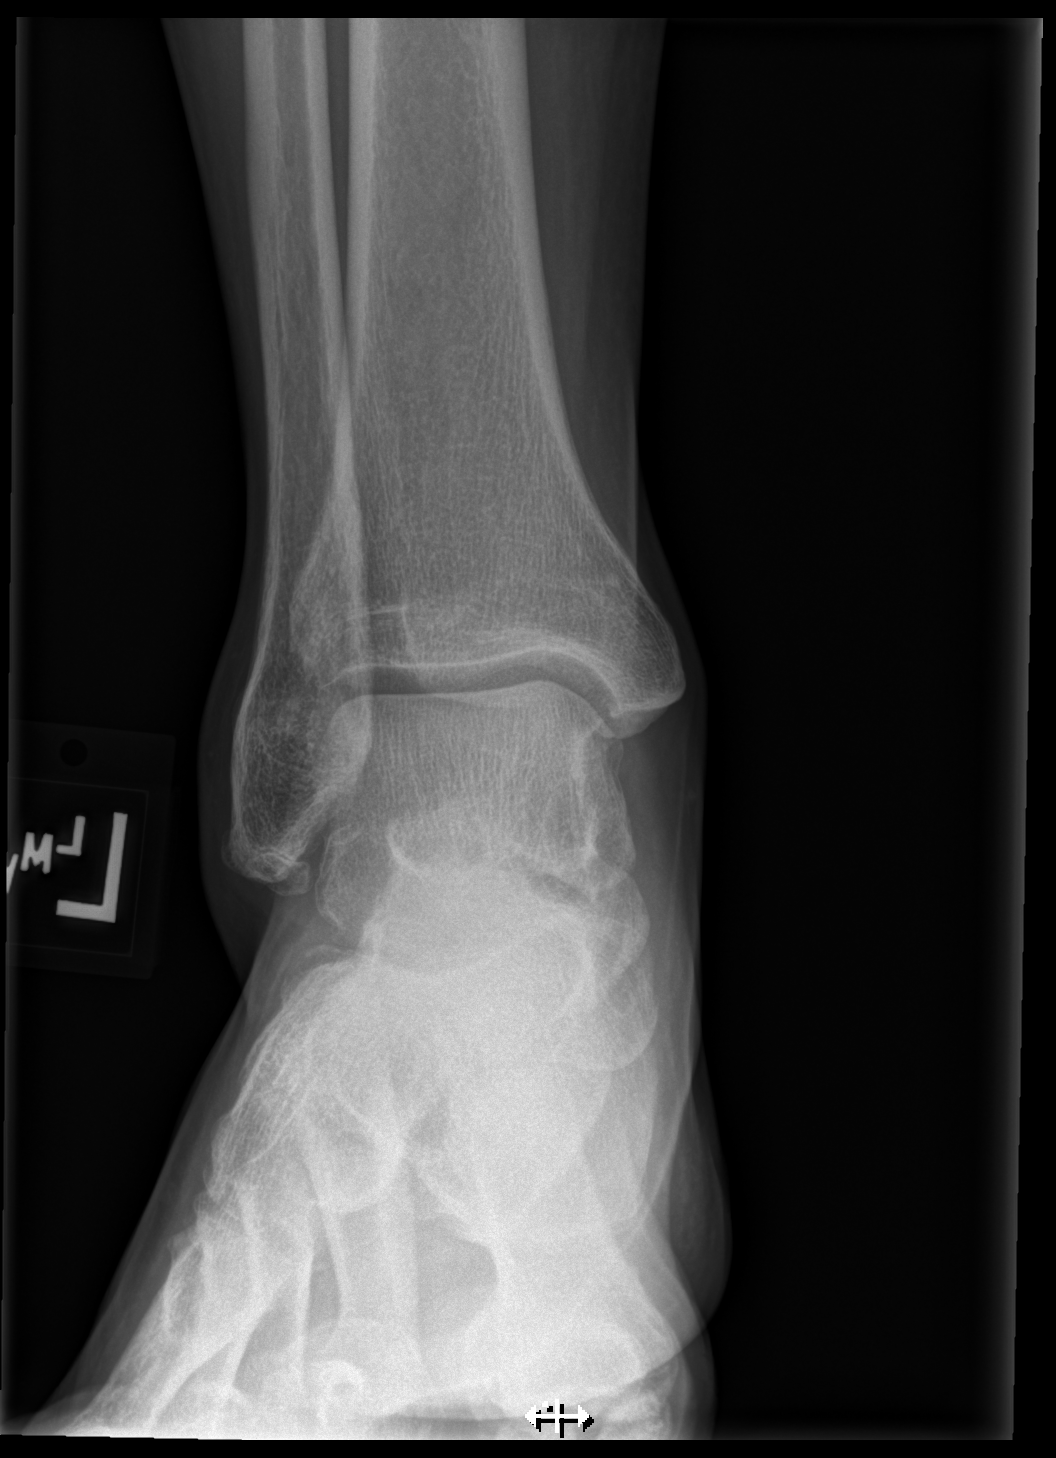
[im 2/3]
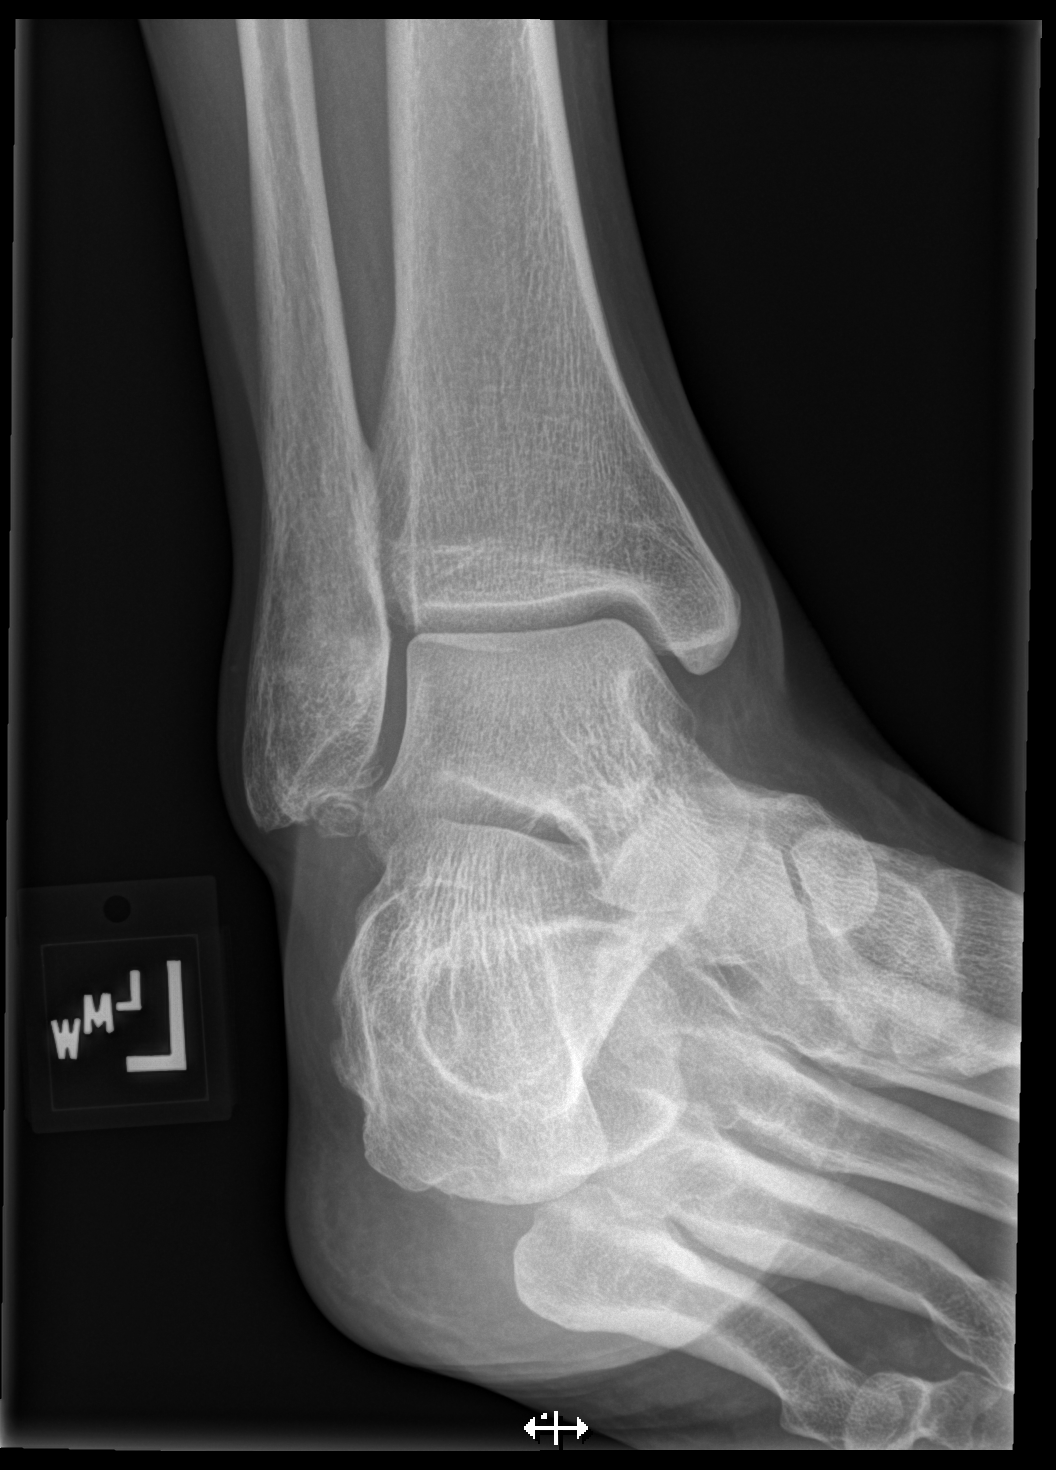
[im 3/3]
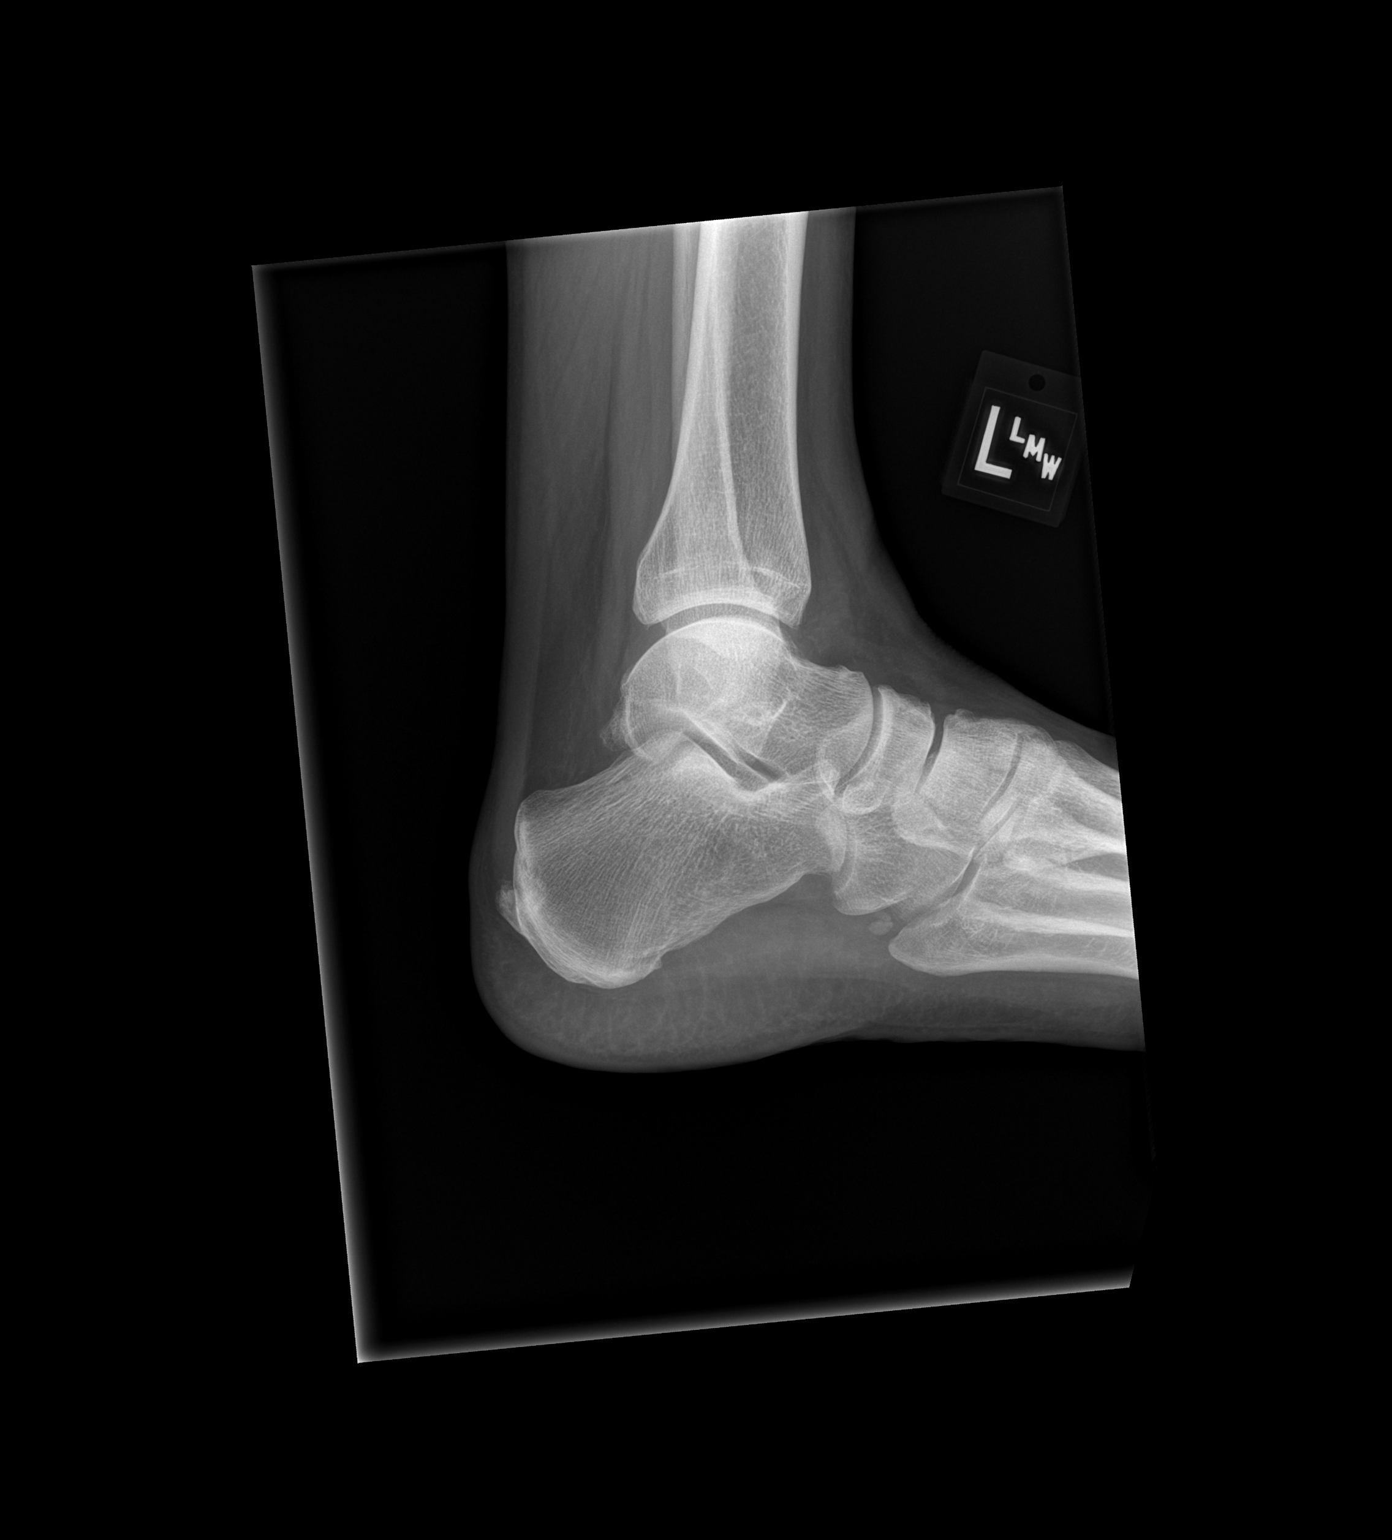

[3 of 3 positions shown; findings below may reference images not displayed]

FINDINGS: Normal anatomic alignment. No evidence for acute fracture or
dislocation. Midfoot degenerative changes. Tailor dome is intact.
Mild soft tissue swelling about the lateral malleolus. No evidence
for acute osseous erosion. Unchanged likely chronic osseous
fragments about the lateral malleolus.
IMPRESSION: Mild soft tissue swelling about the lateral malleolus. Infectious
process is not excluded.

## 2016-11-05 ENCOUNTER — Telehealth: Payer: Self-pay | Admitting: Pharmacist

## 2016-11-05 NOTE — Telephone Encounter (Signed)
Ventolin & Advair 100/50 PAP submitted to manufacturer today.

## 2016-11-13 IMAGING — DX DG FOOT COMPLETE 3+V*L*
3 series · 3 of 3 positions shown · non-contrast
Comparison: None.

CLINICAL DATA: Left fifth toe pain, history of gout, rolling injury

EXAM:
LEFT FOOT - COMPLETE 3+ VIEW

[foot ap]
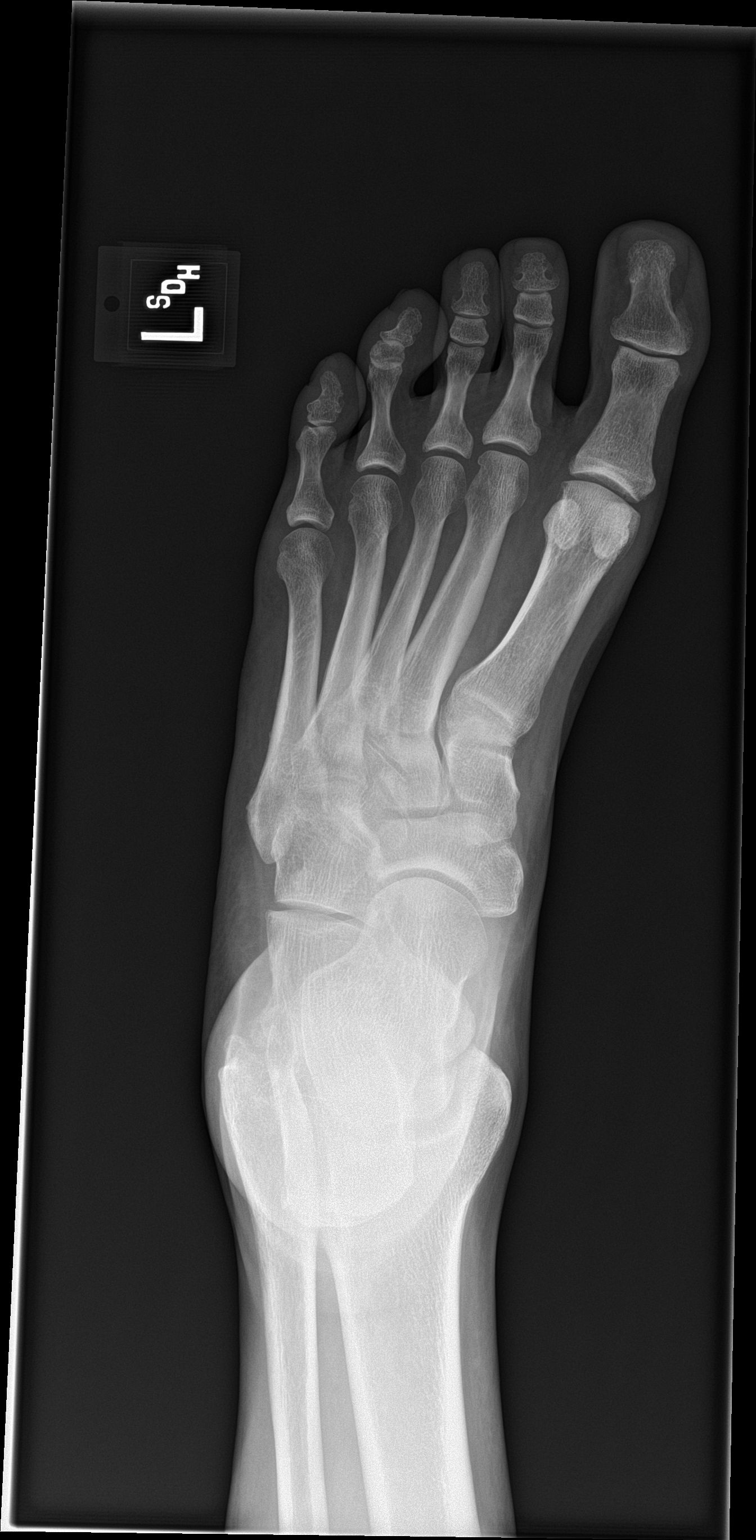

[foot obl]
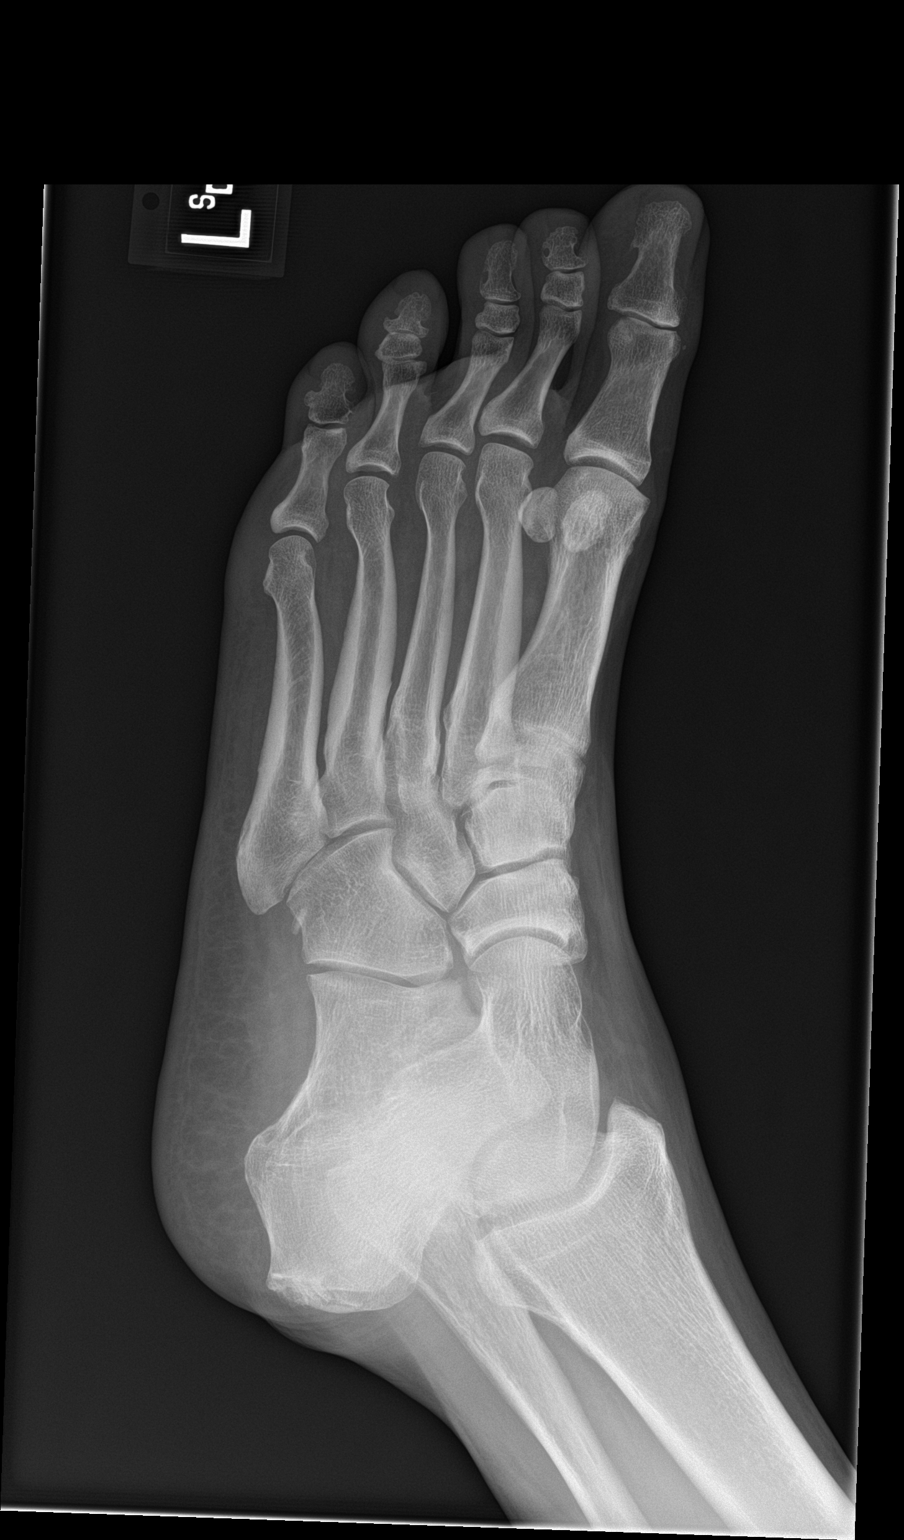

[foot lat]
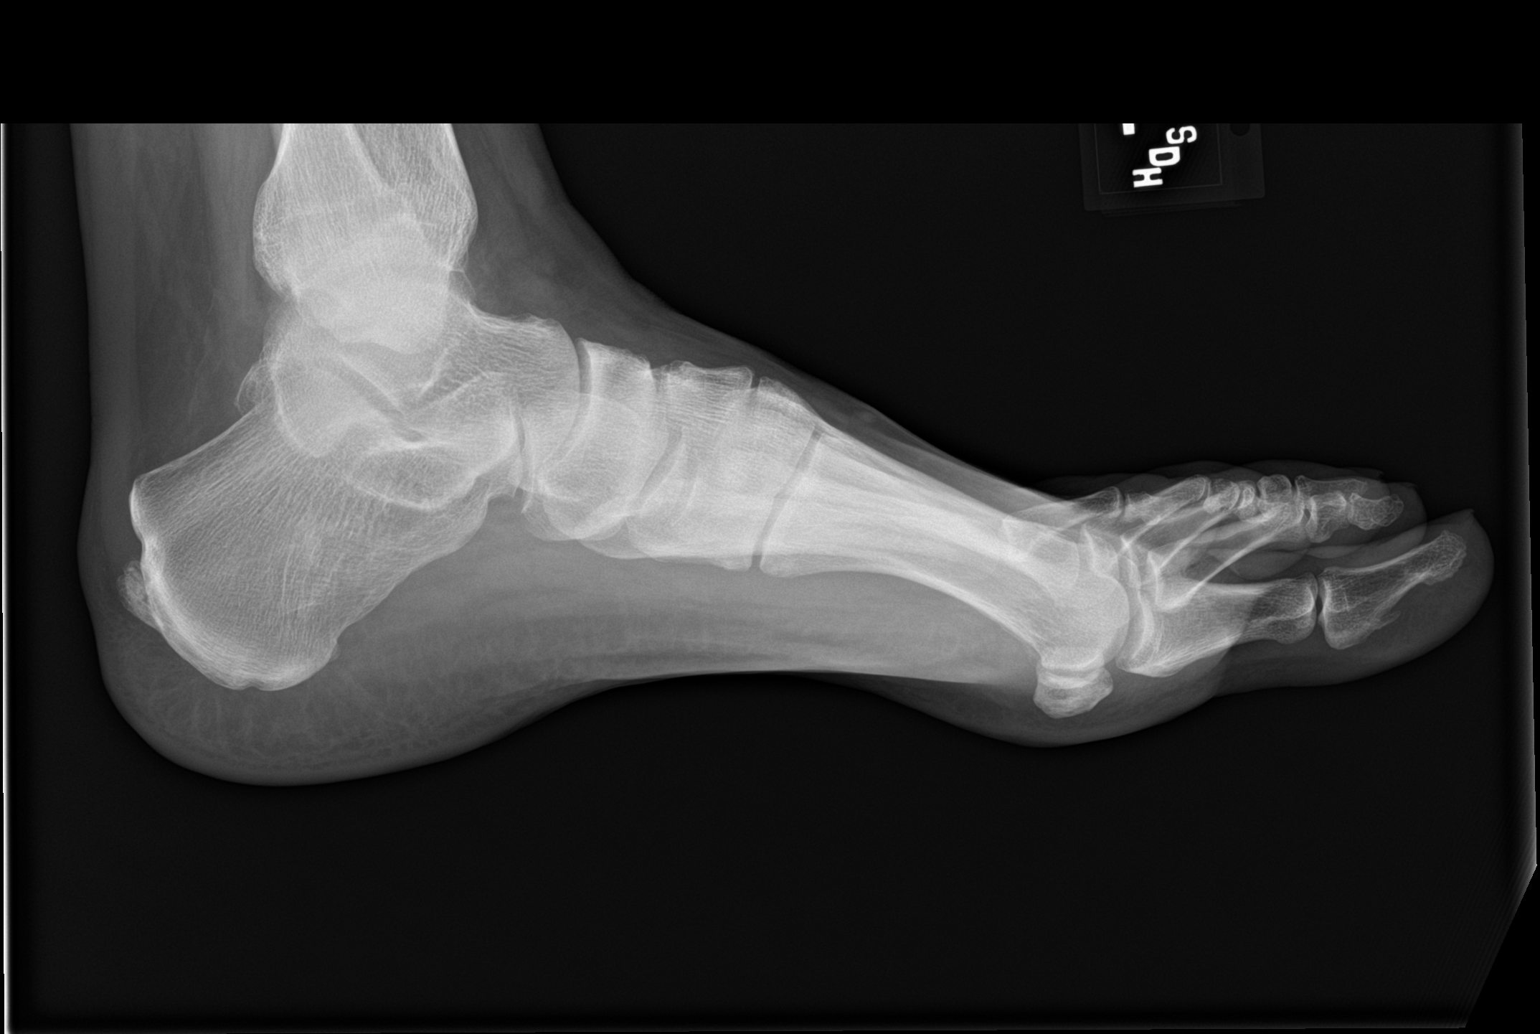

[3 of 3 positions shown; findings below may reference images not displayed]

FINDINGS: Three views of the left foot submitted. No acute fracture or
subluxation. There is small posterior spur of calcaneus.
IMPRESSION: No acute fracture or subluxation. Small posterior spur of calcaneus.

## 2016-11-16 ENCOUNTER — Emergency Department
Admission: EM | Admit: 2016-11-16 | Discharge: 2016-11-16 | Disposition: A | Discharge status: Home / Self Care | Payer: No Typology Code available for payment source | Attending: Emergency Medicine | Admitting: Emergency Medicine

## 2016-11-16 ENCOUNTER — Encounter: Payer: Self-pay | Admitting: Emergency Medicine

## 2016-11-16 DIAGNOSIS — Z79899 Other long term (current) drug therapy: Secondary | ICD-10-CM | POA: Insufficient documentation

## 2016-11-16 DIAGNOSIS — Z87891 Personal history of nicotine dependence: Secondary | ICD-10-CM | POA: Insufficient documentation

## 2016-11-16 DIAGNOSIS — I10 Essential (primary) hypertension: Secondary | ICD-10-CM | POA: Insufficient documentation

## 2016-11-16 DIAGNOSIS — J45909 Unspecified asthma, uncomplicated: Secondary | ICD-10-CM | POA: Insufficient documentation

## 2016-11-16 DIAGNOSIS — F1010 Alcohol abuse, uncomplicated: Secondary | ICD-10-CM | POA: Insufficient documentation

## 2016-11-16 MED ORDER — CHLORDIAZEPOXIDE HCL 25 MG PO CAPS: ORAL_CAPSULE | ORAL | 0 refills | Status: DC

## 2016-11-16 MED ORDER — ONDANSETRON HCL 4 MG PO TABS: 4.0000 mg | ORAL_TABLET | Freq: Three times a day (TID) | ORAL | 0 refills | Status: DC | PRN

## 2016-11-16 NOTE — ED Notes (Signed)
Patient here with tremors. Had some wine yesterday during the day and some irish cream last night. Has not had any alcohol today. Denies hearing any voices or seeing anything not real. Denies headache.

## 2016-11-16 NOTE — ED Notes (Signed)
Notified Dr Archie Balboa about patients blood pressure prior to discharge. Per Dr Archie Balboa he is ok with patients blood pressure.

## 2016-11-16 NOTE — Discharge Instructions (Signed)
Please seek medical attention for any high fevers, chest pain, shortness of breath, change in behavior, persistent vomiting, bloody stool or any other new or concerning symptoms.  

## 2016-11-16 NOTE — ED Provider Notes (Signed)
[facility] Emergency Department Provider Note    ____________________________________________   I have reviewed the triage vital signs and the nursing notes.   HISTORY  Chief Complaint Tremors   History limited by: Not Limited   HPI Miguel [name] is a 56 y.o. male who presents to the emergency department today requesting help with detox. The patient states that he last drank last night. The patient has been trying to quit alcohol use. He was seen in the emergency department for this in the past. He states that he had some wine last night. Today he is been having some tremors and nausea. He has not been able to hold down any food. He has tried some Pedialyte. Patient states he cannot go to an inpatient facility given the fact that he takes care of his wife.   Past Medical History:  Diagnosis Date  . Asthma   . GERD (gastroesophageal reflux disease)   . Gout   . Hypertension   . OCD (obsessive compulsive disorder)   . Renal colic     Patient Active Problem List   Diagnosis Date Noted  . Moderate major depression, single episode ([place]) 08/15/2016  . Substance induced mood disorder ([place]) 08/15/2016  . Self-inflicted injury [id number]  . Involuntary commitment 08/15/2016  . Alcohol abuse 02/05/2016  . Hypertension 12/05/2015  . Tachycardia 12/05/2015  . Gout 11/13/2015  . Chronic back pain 05/02/2015    Past Surgical History:  Procedure Laterality Date  . CHOLECYSTECTOMY  2012    Prior to Admission medications   Medication Sig Start Date End Date Taking? Authorizing Provider  albuterol (VENTOLIN HFA) 108 (90 Base) MCG/ACT inhaler Inhale 2 puffs into the lungs every 4 (four) hours as needed for wheezing or shortness of breath.    Historical Provider, MD  allopurinol (ZYLOPRIM) 300 MG tablet Take 300 mg by mouth every morning.    Historical Provider, MD  chlordiazePOXIDE (LIBRIUM) 10 MG capsule Take 1 capsule (10 mg total) by mouth 3 (three) times  daily as needed for anxiety or withdrawal. 06/30/16   Carrie Mew, MD  colchicine 0.6 MG tablet 1 tablet once or twice a day with food 04/30/16 05/31/16  Larene Beach A McGowan, PA-C  cyclobenzaprine (FLEXERIL) 5 MG tablet Take 1 tablet (5 mg total) by mouth 3 (three) times daily as needed for muscle spasms. Patient not taking: Reported on 08/15/2016 07/28/16   Dannielle Karvonen Menshew, PA-C  dexlansoprazole (DEXILANT) 60 MG capsule Take 60 mg by mouth every morning.    Historical Provider, MD  etodolac (LODINE) 200 MG capsule Take 1 capsule (200 mg total) by mouth every 8 (eight) hours. Patient not taking: Reported on 08/15/2016 07/27/16   Loney Hering, MD  FLUoxetine (PROZAC) 20 MG capsule Take 1 capsule (20 mg total) by mouth daily. 08/15/16   Gonzella Lex, MD  Fluticasone-Salmeterol (ADVAIR) 100-50 MCG/DOSE AEPB Inhale 1 puff into the lungs 2 (two) times daily.    Historical Provider, MD  HYDROcodone-acetaminophen (NORCO/VICODIN) 5-325 MG tablet Take 1-2 tablets by mouth every 4 (four) hours as needed for moderate pain. Patient not taking: Reported on 08/15/2016 05/02/16   Hinda Kehr, MD  ibuprofen (ADVIL,MOTRIN) 600 MG tablet Take 1 tablet (600 mg total) by mouth every 6 (six) hours as needed. Patient not taking: Reported on 08/15/2016 12/31/15   Sable Feil, PA-C  ketorolac (TORADOL) 10 MG tablet Take 1 tablet (10 mg total) by mouth every 8 (eight) hours. Patient not taking: Reported on  08/15/2016 07/28/16   Jenise V Bacon Menshew, PA-C  lidocaine (LIDODERM) 5 % Place 1 patch onto the skin every 12 (twelve) hours. Remove & Discard patch within 12 hours or as directed by MD 07/22/16 07/22/17  Nance Pear, MD  lisinopril (PRINIVIL,ZESTRIL) 20 MG tablet Take 20 mg by mouth every morning.     Historical Provider, MD  LORazepam (ATIVAN) 1 MG tablet Take 1 tablet (1 mg total) by mouth 2 (two) times daily as needed for anxiety. Patient not taking: Reported on 04/30/2016 04/01/16   Daymon Larsen, MD   naproxen (EC NAPROSYN) 500 MG EC tablet Take 1 tablet (500 mg total) by mouth 2 (two) times daily with a meal. Patient not taking: Reported on 08/15/2016 02/13/16   Dannielle Karvonen Menshew, PA-C  oxyCODONE-acetaminophen (ROXICET) 5-325 MG tablet Take 1 tablet by mouth every 6 (six) hours as needed. Patient not taking: Reported on 08/15/2016 07/27/16   Loney Hering, MD  potassium chloride (K-DUR) 10 MEQ tablet Take 1 tablet (10 mEq total) by mouth daily. Patient not taking: Reported on 08/15/2016 04/30/16   Larene Beach A McGowan, PA-C  predniSONE (DELTASONE) 20 MG tablet Take 3 tablets (60 mg total) by mouth daily. Patient not taking: Reported on 08/15/2016 05/02/16   Hinda Kehr, MD    Allergies Patient has no known allergies.  No family history on file.  Social History Social History  Substance Use Topics  . Smoking status: Former Research scientist (life sciences)  . Smokeless tobacco: Never Used  . Alcohol use Yes    Review of Systems  Constitutional: Negative for fever. Cardiovascular: Negative for chest pain. Respiratory: Negative for shortness of breath. Gastrointestinal: Positive for nausea and vomiting. Genitourinary: Negative for dysuria. Musculoskeletal: Negative for back pain. Skin: Negative for rash. Neurological: Positive for tremors.   10-point ROS otherwise negative.  ____________________________________________   PHYSICAL EXAM:  VITAL SIGNS: ED Triage Vitals  Enc Vitals Group     BP 11/16/16 1526 (!) 196/97     Pulse Rate 11/16/16 1526 95     Resp 11/16/16 1526 (!) 22     Temp 11/16/16 1526 98.4 F (36.9 C)     Temp Source 11/16/16 1526 Oral     SpO2 11/16/16 1526 100 %     Weight 11/16/16 1527 200 lb (90.7 kg)     Height 11/16/16 1527 6' (1.829 m)     Head Circumference --      Peak Flow --      Pain Score 11/16/16 1527 9   Constitutional: Alert and oriented. Well appearing and in no distress.Occasional tremor. Eyes: Conjunctivae are normal. Normal extraocular  movements. ENT   Head: Normocephalic and atraumatic.   Nose: No congestion/rhinnorhea.   Mouth/Throat: Mucous membranes are moist.   Neck: No stridor. Hematological/Lymphatic/Immunilogical: No cervical lymphadenopathy. Cardiovascular: Normal rate, regular rhythm.  No murmurs, rubs, or gallops.  Respiratory: Normal respiratory effort without tachypnea nor retractions. Breath sounds are clear and equal bilaterally. No wheezes/rales/rhonchi. Gastrointestinal: Soft and non tender. No rebound. No guarding.  Genitourinary: Deferred Musculoskeletal: Normal range of motion in all extremities. No lower extremity edema. Neurologic:  Normal speech and language. No gross focal neurologic deficits are appreciated.  Skin:  Skin is warm, dry and intact. No rash noted. Psychiatric: Mood and affect are normal. Speech and behavior are normal. Patient exhibits appropriate insight and judgment.  ____________________________________________    LABS (pertinent positives/negatives)  None  ____________________________________________   EKG  None  ____________________________________________    RADIOLOGY  None  ____________________________________________   PROCEDURES  Procedures  ____________________________________________   INITIAL IMPRESSION / ASSESSMENT AND PLAN / ED COURSE  Pertinent labs & imaging results that were available during my care of the patient were reviewed by me and considered in my medical decision making (see chart for details).  Patient presented to the emergency department today with help for detox from alcohol. On exam patient is in no acute distress. He did have an occasional tremors. Patient will be given information for RTS. Additionally will give patient a milligram and Zofran to help with withdrawal symptoms.  ____________________________________________   FINAL CLINICAL IMPRESSION(S) / ED DIAGNOSES  Final diagnoses:  Alcohol abuse      Note: This dictation was prepared with Dragon dictation. Any transcriptional errors that result from this process are unintentional     Nance Pear, MD 11/16/16 1644

## 2016-11-16 NOTE — ED Triage Notes (Signed)
Reports stopped drinking alcohol 4 days ago and cant stop shaking

## 2016-11-16 NOTE — ED Notes (Signed)
Patient ambulated to hallway bathroom.

## 2016-11-18 ENCOUNTER — Emergency Department: Payer: Self-pay

## 2016-11-18 ENCOUNTER — Emergency Department
Admission: EM | Admit: 2016-11-18 | Discharge: 2016-11-19 | Disposition: A | Discharge status: Home / Self Care | Payer: Self-pay | Attending: Emergency Medicine | Admitting: Emergency Medicine

## 2016-11-18 DIAGNOSIS — R109 Unspecified abdominal pain: Secondary | ICD-10-CM | POA: Insufficient documentation

## 2016-11-18 DIAGNOSIS — J45909 Unspecified asthma, uncomplicated: Secondary | ICD-10-CM | POA: Insufficient documentation

## 2016-11-18 DIAGNOSIS — Z87891 Personal history of nicotine dependence: Secondary | ICD-10-CM | POA: Insufficient documentation

## 2016-11-18 DIAGNOSIS — M549 Dorsalgia, unspecified: Secondary | ICD-10-CM | POA: Insufficient documentation

## 2016-11-18 DIAGNOSIS — R112 Nausea with vomiting, unspecified: Secondary | ICD-10-CM | POA: Insufficient documentation

## 2016-11-18 DIAGNOSIS — M25511 Pain in right shoulder: Secondary | ICD-10-CM | POA: Insufficient documentation

## 2016-11-18 DIAGNOSIS — I10 Essential (primary) hypertension: Secondary | ICD-10-CM | POA: Insufficient documentation

## 2016-11-18 LAB — CBC
HCT: 42 % (ref 40.0–52.0)
Hemoglobin: 14.3 g/dL (ref 13.0–18.0)
MCH: 32.5 pg (ref 26.0–34.0)
MCHC: 34 g/dL (ref 32.0–36.0)
MCV: 95.4 fL (ref 80.0–100.0)
Platelets: 175 10*3/uL (ref 150–440)
RBC: 4.4 MIL/uL (ref 4.40–5.90)
RDW: 14.1 % (ref 11.5–14.5)
WBC: 5.4 10*3/uL (ref 3.8–10.6)

## 2016-11-18 LAB — COMPREHENSIVE METABOLIC PANEL
ALT: 17 U/L (ref 17–63)
AST: 22 U/L (ref 15–41)
Albumin: 4.3 g/dL (ref 3.5–5.0)
Alkaline Phosphatase: 75 U/L (ref 38–126)
Anion gap: 9 (ref 5–15)
BUN: 6 mg/dL (ref 6–20)
CO2: 28 mmol/L (ref 22–32)
Calcium: 9.6 mg/dL (ref 8.9–10.3)
Chloride: 104 mmol/L (ref 101–111)
Creatinine, Ser: 0.79 mg/dL (ref 0.61–1.24)
GFR calc Af Amer: >60 mL/min (ref >60)
GFR calc non Af Amer: >60 mL/min (ref >60)
Glucose, Bld: 124 mg/dL — ABNORMAL HIGH (ref 65–99)
Potassium: 3.1 mmol/L — ABNORMAL LOW (ref 3.5–5.1)
Sodium: 141 mmol/L (ref 135–145)
Total Bilirubin: 0.8 mg/dL (ref 0.3–1.2)
Total Protein: 7.1 g/dL (ref 6.5–8.1)

## 2016-11-18 LAB — URINALYSIS, COMPLETE (UACMP) WITH MICROSCOPIC
Bilirubin Urine: NEGATIVE
Glucose, UA: NEGATIVE mg/dL
Hgb urine dipstick: NEGATIVE
Ketones, ur: NEGATIVE mg/dL
Nitrite: NEGATIVE
Protein, ur: NEGATIVE mg/dL
Specific Gravity, Urine: 1.014 (ref 1.005–1.030)
pH: 6 (ref 5.0–8.0)

## 2016-11-18 LAB — LIPASE, BLOOD: Lipase: 41 U/L (ref 11–51)

## 2016-11-18 LAB — TROPONIN I: Troponin I: <0.03 ng/mL (ref <0.03)

## 2016-11-18 MED ORDER — MORPHINE SULFATE (PF) 4 MG/ML IV SOLN
4.0000 mg | Freq: Once | INTRAVENOUS | Status: AC
  Administered 2016-11-18: 4 mg via INTRAVENOUS
  Filled 2016-11-18: qty 1

## 2016-11-18 MED ORDER — ONDANSETRON HCL 4 MG/2ML IJ SOLN
4.0000 mg | Freq: Once | INTRAMUSCULAR | Status: AC
  Administered 2016-11-18: 4 mg via INTRAVENOUS
  Filled 2016-11-18: qty 2

## 2016-11-18 MED ORDER — SODIUM CHLORIDE 0.9 % IV BOLUS (SEPSIS)
1000.0000 mL | Freq: Once | INTRAVENOUS | Status: AC
  Administered 2016-11-18: 1000 mL via INTRAVENOUS

## 2016-11-18 NOTE — ED Notes (Signed)
MD Dahlia Client at bedside at this time.

## 2016-11-18 NOTE — ED Notes (Signed)
Patient states he last drank alcohol 3 days ago and has been drinking Pedialyte and Powerade since. Patient states he quit drinking on Christmas, but then had a few drinks since then, but none in the last 3 days.

## 2016-11-18 NOTE — ED Triage Notes (Signed)
Pt reports dizziness and abdominal pain that began today. Pt alert and oriented X4, active, cooperative, pt in NAD. RR even and unlabored, color WNL.

## 2016-11-18 NOTE — ED Notes (Signed)
Pt. Transported to ct at this time.

## 2016-11-19 MED ORDER — KETOROLAC TROMETHAMINE 30 MG/ML IJ SOLN
30.0000 mg | Freq: Once | INTRAMUSCULAR | Status: AC
  Administered 2016-11-19: 30 mg via INTRAVENOUS
  Filled 2016-11-19: qty 1

## 2016-11-19 MED ORDER — ONDANSETRON 4 MG PO TBDP: 4.0000 mg | ORAL_TABLET | Freq: Three times a day (TID) | ORAL | 0 refills | Status: DC | PRN

## 2016-11-19 MED ORDER — ETODOLAC 200 MG PO CAPS: 200.0000 mg | ORAL_CAPSULE | Freq: Three times a day (TID) | ORAL | 0 refills | Status: DC

## 2016-11-19 NOTE — Discharge Instructions (Signed)
Please follow-up with your primary care physician. Please return if the pain persists or worsens or he have any new concerning symptoms.

## 2016-11-19 NOTE — ED Notes (Signed)
Pt. Verbalizes understanding of d/c instructions, prescriptions, and follow-up. VS stable and pain controlled per pt.  Pt. In NAD at time of d/c and denies further concerns regarding this visit. Pt. Stable at the time of departure from the unit, departing unit by the safest and most appropriate manner per that pt condition and limitations. Pt advised to return to the ED at any time for emergent concerns, or for new/worsening symptoms.   

## 2016-11-19 NOTE — ED Provider Notes (Signed)
[facility] Emergency Department Provider Note   ____________________________________________   First MD Initiated Contact with Patient 11/18/16 2310     (approximate)  I have reviewed the triage vital signs and the nursing notes.   HISTORY  Chief Complaint Dizziness and Abdominal Pain    HPI Miguel [name] is a 56 y.o. male who comes into the hospital today with some right-sided abdominal pain going around his back. The patient reports he can't sleep. He initially stated that it started today but he's been complaining that it didn't bother him yesterday. The patient is not taking anything for pain at home. He does have a history of alcohol abuse and has been on some medication for withdrawal. The patient states that he has not been sleeping well but then he said is due to his pain. The patient reports that he had some pain in his neck as well but he isn't sure if it's related.The patient reports that the pains are sharp. He thought maybe it was a kidney stone but he wasn't sure. He's had some nausea and vomiting today and yesterday. The patient was drinking Pedialyte and water but he did feel feverish and sweaty today. The patient has not eaten much and he said he just didn't feel right. The patient rates his pain a 9 out of 10 in intensity.   Past Medical History:  Diagnosis Date  . Asthma   . GERD (gastroesophageal reflux disease)   . Gout   . Hypertension   . OCD (obsessive compulsive disorder)   . Renal colic     Patient Active Problem List   Diagnosis Date Noted  . Moderate major depression, single episode ([place]) 08/15/2016  . Substance induced mood disorder ([place]) 08/15/2016  . Self-inflicted injury [id number]  . Involuntary commitment 08/15/2016  . Alcohol abuse 02/05/2016  . Hypertension 12/05/2015  . Tachycardia 12/05/2015  . Gout 11/13/2015  . Chronic back pain 05/02/2015    Past Surgical History:  Procedure Laterality Date  .  CHOLECYSTECTOMY  2012    Prior to Admission medications   Medication Sig Start Date End Date Taking? Authorizing Provider  albuterol (VENTOLIN HFA) 108 (90 Base) MCG/ACT inhaler Inhale 2 puffs into the lungs every 4 (four) hours as needed for wheezing or shortness of breath.    Historical Provider, MD  allopurinol (ZYLOPRIM) 300 MG tablet Take 300 mg by mouth every morning.    Historical Provider, MD  chlordiazePOXIDE (LIBRIUM) 10 MG capsule Take 1 capsule (10 mg total) by mouth 3 (three) times daily as needed for anxiety or withdrawal. 06/30/16   Carrie Mew, MD  chlordiazePOXIDE (LIBRIUM) 25 MG capsule Day 1 - 50 mg every 6 hours Day 2 - 25 mg every 6 hours Day 3 - 25 mg twice a day Day 4- 25 mg at night 11/16/16   Nance Pear, MD  colchicine 0.6 MG tablet 1 tablet once or twice a day with food 04/30/16 05/31/16  Larene Beach A McGowan, PA-C  cyclobenzaprine (FLEXERIL) 5 MG tablet Take 1 tablet (5 mg total) by mouth 3 (three) times daily as needed for muscle spasms. Patient not taking: Reported on 08/15/2016 07/28/16   Dannielle Karvonen Menshew, PA-C  dexlansoprazole (DEXILANT) 60 MG capsule Take 60 mg by mouth every morning.    Historical Provider, MD  etodolac (LODINE) 200 MG capsule Take 1 capsule (200 mg total) by mouth every 8 (eight) hours. Patient not taking: Reported on 08/15/2016 07/27/16   Loney Hering, MD  etodolac (LODINE) 200 MG capsule Take 1 capsule (200 mg total) by mouth every 8 (eight) hours. 11/19/16   Loney Hering, MD  FLUoxetine (PROZAC) 20 MG capsule Take 1 capsule (20 mg total) by mouth daily. 08/15/16   Gonzella Lex, MD  Fluticasone-Salmeterol (ADVAIR) 100-50 MCG/DOSE AEPB Inhale 1 puff into the lungs 2 (two) times daily.    Historical Provider, MD  HYDROcodone-acetaminophen (NORCO/VICODIN) 5-325 MG tablet Take 1-2 tablets by mouth every 4 (four) hours as needed for moderate pain. Patient not taking: Reported on 08/15/2016 05/02/16   Hinda Kehr, MD  ibuprofen  (ADVIL,MOTRIN) 600 MG tablet Take 1 tablet (600 mg total) by mouth every 6 (six) hours as needed. Patient not taking: Reported on 08/15/2016 12/31/15   Sable Feil, PA-C  ketorolac (TORADOL) 10 MG tablet Take 1 tablet (10 mg total) by mouth every 8 (eight) hours. Patient not taking: Reported on 08/15/2016 07/28/16   Dannielle Karvonen Menshew, PA-C  lidocaine (LIDODERM) 5 % Place 1 patch onto the skin every 12 (twelve) hours. Remove & Discard patch within 12 hours or as directed by MD 07/22/16 07/22/17  Nance Pear, MD  lisinopril (PRINIVIL,ZESTRIL) 20 MG tablet Take 20 mg by mouth every morning.     Historical Provider, MD  LORazepam (ATIVAN) 1 MG tablet Take 1 tablet (1 mg total) by mouth 2 (two) times daily as needed for anxiety. Patient not taking: Reported on 04/30/2016 04/01/16   Daymon Larsen, MD  naproxen (EC NAPROSYN) 500 MG EC tablet Take 1 tablet (500 mg total) by mouth 2 (two) times daily with a meal. Patient not taking: Reported on 08/15/2016 02/13/16   Dannielle Karvonen Menshew, PA-C  ondansetron (ZOFRAN ODT) 4 MG disintegrating tablet Take 1 tablet (4 mg total) by mouth every 8 (eight) hours as needed for nausea or vomiting. 11/19/16   Loney Hering, MD  ondansetron (ZOFRAN) 4 MG tablet Take 1 tablet (4 mg total) by mouth every 8 (eight) hours as needed for nausea or vomiting. 11/16/16   Nance Pear, MD  oxyCODONE-acetaminophen (ROXICET) 5-325 MG tablet Take 1 tablet by mouth every 6 (six) hours as needed. Patient not taking: Reported on 08/15/2016 07/27/16   Loney Hering, MD  potassium chloride (K-DUR) 10 MEQ tablet Take 1 tablet (10 mEq total) by mouth daily. Patient not taking: Reported on 08/15/2016 04/30/16   Larene Beach A McGowan, PA-C  predniSONE (DELTASONE) 20 MG tablet Take 3 tablets (60 mg total) by mouth daily. Patient not taking: Reported on 08/15/2016 05/02/16   Hinda Kehr, MD    Allergies Patient has no known allergies.  No family history on file.  Social History Social  History  Substance Use Topics  . Smoking status: Former Research scientist (life sciences)  . Smokeless tobacco: Never Used  . Alcohol use Yes    Review of Systems Constitutional: Sweats and feverish Eyes: No visual changes. ENT: No sore throat. Cardiovascular: Denies chest pain. Respiratory: Denies shortness of breath. Gastrointestinal:  abdominal pain.   nausea, vomiting.  No diarrhea.  No constipation. Genitourinary: Negative for dysuria. Musculoskeletal: right Back pain and shoulder pain Skin: Negative for rash. Neurological: Negative for headaches, focal weakness or numbness.  10-point ROS otherwise negative.  ____________________________________________   PHYSICAL EXAM:  VITAL SIGNS: ED Triage Vitals  Enc Vitals Group     BP 11/18/16 1657 123/73     Pulse Rate 11/18/16 1657 92     Resp 11/18/16 1657 18     Temp 11/18/16 1657 97.9  F (36.6 C)     Temp Source 11/18/16 1657 Oral     SpO2 11/18/16 1657 99 %     Weight 11/18/16 1656 200 lb (90.7 kg)     Height 11/18/16 1656 6' (1.829 m)     Head Circumference --      Peak Flow --      Pain Score 11/18/16 1703 8     Pain Loc --      Pain Edu? --      Excl. in [place]? --     Constitutional: Alert and oriented. Well appearing and in moderate distress. Eyes: Conjunctivae are normal. PERRL. EOMI. Head: Atraumatic. Nose: No congestion/rhinnorhea. Mouth/Throat: Mucous membranes are moist.  Oropharynx non-erythematous. Cardiovascular: Normal rate, regular rhythm. Grossly normal heart sounds.  Good peripheral circulation. Respiratory: Normal respiratory effort.  No retractions. Lungs CTAB. Gastrointestinal: Soft with some right sided tenderness to palpation. No distention. Right CVA tenderness. Musculoskeletal: No lower extremity tenderness nor edema.   Neurologic:  Normal speech and language.  Skin:  Skin is warm, dry and intact.  Psychiatric: Mood and affect are normal.   ____________________________________________   LABS (all labs ordered  are listed, but only abnormal results are displayed)  Labs Reviewed  COMPREHENSIVE METABOLIC PANEL - Abnormal; Notable for the following:       Result Value   Potassium 3.1 (*)    Glucose, Bld 124 (*)    All other components within normal limits  URINALYSIS, COMPLETE (UACMP) WITH MICROSCOPIC - Abnormal; Notable for the following:    Color, Urine YELLOW (*)    APPearance CLEAR (*)    Leukocytes, UA SMALL (*)    Bacteria, UA RARE (*)    Squamous Epithelial / LPF 0-5 (*)    All other components within normal limits  LIPASE, BLOOD  CBC  TROPONIN I   ____________________________________________  EKG  ED ECG REPORT I, Loney Hering, the attending physician, personally viewed and interpreted this ECG.   Date: 11/18/2016  EKG Time: 1704  Rate: 95  Rhythm: normal sinus rhythm  Axis: left axis deviation  Intervals:none  ST&T Change: none  ____________________________________________  RADIOLOGY  CT renal stone study ____________________________________________   PROCEDURES  Procedure(s) performed: None  Procedures  Critical Care performed: No  ____________________________________________   INITIAL IMPRESSION / ASSESSMENT AND PLAN / ED COURSE  Pertinent labs & imaging results that were available during my care of the patient were reviewed by me and considered in my medical decision making (see chart for details).  This is a 56 year old male who comes into the hospital with some right sided and right flank abdominal pain. The patient reports that he has not been feeling well and he has been having these symptoms. The patient has not taken anything for pain and his blood work is unremarkable. Given the concern for some dark looking urine as well as a kidney stone and also in the patient for renal stone study. The patient will be reassessed.  Clinical Course as of Nov 19 52  Thu Nov 19, 2016  0003 IMPRESSION: 1. No acute findings are evident in the abdomen or  pelvis. 2. Diverticulosis. 3. Bilateral nephrolithiasis.   CT Renal Laren Everts [AW]    Clinical Course User Index [AW] Loney Hering, MD   The patient's CT shows some nephrolithiasis but no hydronephrosis or stones in the ureter. I did give the patient a dose of morphine and Toradol for pain. The patient will receive a liter of  normal saline as well. I will reassess the patient and disposition the patient once she's received all of his medication.  ____________________________________________   FINAL CLINICAL IMPRESSION(S) / ED DIAGNOSES  Final diagnoses:  Abdominal pain, unspecified abdominal location  Flank pain      NEW MEDICATIONS STARTED DURING THIS VISIT:  New Prescriptions   ETODOLAC (LODINE) 200 MG CAPSULE    Take 1 capsule (200 mg total) by mouth every 8 (eight) hours.   ONDANSETRON (ZOFRAN ODT) 4 MG DISINTEGRATING TABLET    Take 1 tablet (4 mg total) by mouth every 8 (eight) hours as needed for nausea or vomiting.     Note:  This document was prepared using Dragon voice recognition software and may include unintentional dictation errors.    Loney Hering, MD 11/19/16 [phone]

## 2016-11-20 ENCOUNTER — Emergency Department
Admission: EM | Admit: 2016-11-20 | Discharge: 2016-11-20 | Disposition: A | Discharge status: Home / Self Care | Payer: Self-pay | Attending: Emergency Medicine | Admitting: Emergency Medicine

## 2016-11-20 ENCOUNTER — Encounter: Payer: Self-pay | Admitting: Emergency Medicine

## 2016-11-20 ENCOUNTER — Emergency Department: Payer: Self-pay

## 2016-11-20 DIAGNOSIS — Z79899 Other long term (current) drug therapy: Secondary | ICD-10-CM | POA: Insufficient documentation

## 2016-11-20 DIAGNOSIS — J45909 Unspecified asthma, uncomplicated: Secondary | ICD-10-CM | POA: Insufficient documentation

## 2016-11-20 DIAGNOSIS — R1084 Generalized abdominal pain: Secondary | ICD-10-CM | POA: Insufficient documentation

## 2016-11-20 DIAGNOSIS — Z87891 Personal history of nicotine dependence: Secondary | ICD-10-CM | POA: Insufficient documentation

## 2016-11-20 DIAGNOSIS — I10 Essential (primary) hypertension: Secondary | ICD-10-CM | POA: Insufficient documentation

## 2016-11-20 LAB — CBC
HCT: 43.4 % (ref 40.0–52.0)
Hemoglobin: 14.1 g/dL (ref 13.0–18.0)
MCH: 31.3 pg (ref 26.0–34.0)
MCHC: 32.4 g/dL (ref 32.0–36.0)
MCV: 96.6 fL (ref 80.0–100.0)
Platelets: 175 10*3/uL (ref 150–440)
RBC: 4.49 MIL/uL (ref 4.40–5.90)
RDW: 14.6 % — ABNORMAL HIGH (ref 11.5–14.5)
WBC: 7.8 10*3/uL (ref 3.8–10.6)

## 2016-11-20 LAB — COMPREHENSIVE METABOLIC PANEL
ALT: 20 U/L (ref 17–63)
AST: 20 U/L (ref 15–41)
Albumin: 4.4 g/dL (ref 3.5–5.0)
Alkaline Phosphatase: 80 U/L (ref 38–126)
Anion gap: 7 (ref 5–15)
BUN: 14 mg/dL (ref 6–20)
CO2: 30 mmol/L (ref 22–32)
Calcium: 9.5 mg/dL (ref 8.9–10.3)
Chloride: 108 mmol/L (ref 101–111)
Creatinine, Ser: 0.83 mg/dL (ref 0.61–1.24)
GFR calc Af Amer: >60 mL/min (ref >60)
GFR calc non Af Amer: >60 mL/min (ref >60)
Glucose, Bld: 139 mg/dL — ABNORMAL HIGH (ref 65–99)
Potassium: 4.2 mmol/L (ref 3.5–5.1)
Sodium: 145 mmol/L (ref 135–145)
Total Bilirubin: 0.7 mg/dL (ref 0.3–1.2)
Total Protein: 7.4 g/dL (ref 6.5–8.1)

## 2016-11-20 LAB — LIPASE, BLOOD: Lipase: 42 U/L (ref 11–51)

## 2016-11-20 MED ORDER — IOPAMIDOL (ISOVUE-300) INJECTION 61%
100.0000 mL | Freq: Once | INTRAVENOUS | Status: AC | PRN
  Administered 2016-11-20: 100 mL via INTRAVENOUS
  Filled 2016-11-20: qty 100

## 2016-11-20 MED ORDER — LORAZEPAM 2 MG/ML IJ SOLN
1.0000 mg | Freq: Once | INTRAMUSCULAR | Status: AC
  Administered 2016-11-20: 2 mg via INTRAVENOUS
  Filled 2016-11-20: qty 1

## 2016-11-20 MED ORDER — SODIUM CHLORIDE 0.9 % IV BOLUS (SEPSIS)
500.0000 mL | Freq: Once | INTRAVENOUS | Status: AC
  Administered 2016-11-20: 500 mL via INTRAVENOUS

## 2016-11-20 MED ORDER — ONDANSETRON HCL 4 MG/2ML IJ SOLN
4.0000 mg | Freq: Once | INTRAMUSCULAR | Status: AC
  Administered 2016-11-20: 4 mg via INTRAVENOUS

## 2016-11-20 MED ORDER — ONDANSETRON HCL 4 MG/2ML IJ SOLN
INTRAMUSCULAR | Status: AC
  Filled 2016-11-20: qty 2

## 2016-11-20 MED ORDER — IOPAMIDOL (ISOVUE-300) INJECTION 61%
30.0000 mL | Freq: Once | INTRAVENOUS | Status: AC
  Administered 2016-11-20: 30 mL via ORAL
  Filled 2016-11-20: qty 30

## 2016-11-20 NOTE — ED Provider Notes (Signed)
[facility] Emergency Department Provider Note   ____________________________________________   First MD Initiated Contact with Patient 11/20/16 1912     (approximate)  I have reviewed the triage vital signs and the nursing notes.   HISTORY  Chief Complaint Abdominal Pain    HPI Miguel [name] is a 56 y.o. male reports he been having pain and discomfort in his right mid to right lower abdomen for about 3-4 days. He's been feeling nauseatedbut not vomiting. He also reports that he's been feeling rather shaky, although overall it is better than it was a few days ago as he is currently on medication to help with alcohol withdrawal. His last drink was about 4-5 days ago. No seizures. No hallucinations. Reports decreased appetite, feeling shaky and hands. Reports a moderate to severe crampy like pain in the right mid to right lower abdomen. No fevers or chills. No chest pain or shortness of breath   No hallucinations. No desire or thoughts of hurting himself or anyone else.  Past Medical History:  Diagnosis Date  . Asthma   . GERD (gastroesophageal reflux disease)   . Gout   . Hypertension   . OCD (obsessive compulsive disorder)   . Renal colic     Patient Active Problem List   Diagnosis Date Noted  . Moderate major depression, single episode ([place]) 08/15/2016  . Substance induced mood disorder ([place]) 08/15/2016  . Self-inflicted injury [id number]  . Involuntary commitment 08/15/2016  . Alcohol abuse 02/05/2016  . Hypertension 12/05/2015  . Tachycardia 12/05/2015  . Gout 11/13/2015  . Chronic back pain 05/02/2015    Past Surgical History:  Procedure Laterality Date  . CHOLECYSTECTOMY  2012    Prior to Admission medications   Medication Sig Start Date End Date Taking? Authorizing Provider  albuterol (VENTOLIN HFA) 108 (90 Base) MCG/ACT inhaler Inhale 2 puffs into the lungs every 4 (four) hours as needed for wheezing or shortness of breath.     Historical Provider, MD  allopurinol (ZYLOPRIM) 300 MG tablet Take 300 mg by mouth every morning.    Historical Provider, MD  chlordiazePOXIDE (LIBRIUM) 10 MG capsule Take 1 capsule (10 mg total) by mouth 3 (three) times daily as needed for anxiety or withdrawal. 06/30/16   Carrie Mew, MD  chlordiazePOXIDE (LIBRIUM) 25 MG capsule Day 1 - 50 mg every 6 hours Day 2 - 25 mg every 6 hours Day 3 - 25 mg twice a day Day 4- 25 mg at night 11/16/16   Nance Pear, MD  colchicine 0.6 MG tablet 1 tablet once or twice a day with food 04/30/16 05/31/16  Larene Beach A McGowan, PA-C  cyclobenzaprine (FLEXERIL) 5 MG tablet Take 1 tablet (5 mg total) by mouth 3 (three) times daily as needed for muscle spasms. Patient not taking: Reported on 08/15/2016 07/28/16   Dannielle Karvonen Menshew, PA-C  dexlansoprazole (DEXILANT) 60 MG capsule Take 60 mg by mouth every morning.    Historical Provider, MD  etodolac (LODINE) 200 MG capsule Take 1 capsule (200 mg total) by mouth every 8 (eight) hours. Patient not taking: Reported on 08/15/2016 07/27/16   Loney Hering, MD  etodolac (LODINE) 200 MG capsule Take 1 capsule (200 mg total) by mouth every 8 (eight) hours. 11/19/16   Loney Hering, MD  FLUoxetine (PROZAC) 20 MG capsule Take 1 capsule (20 mg total) by mouth daily. 08/15/16   Gonzella Lex, MD  Fluticasone-Salmeterol (ADVAIR) 100-50 MCG/DOSE AEPB Inhale 1 puff into the  lungs 2 (two) times daily.    Historical Provider, MD  HYDROcodone-acetaminophen (NORCO/VICODIN) 5-325 MG tablet Take 1-2 tablets by mouth every 4 (four) hours as needed for moderate pain. Patient not taking: Reported on 08/15/2016 05/02/16   Hinda Kehr, MD  ibuprofen (ADVIL,MOTRIN) 600 MG tablet Take 1 tablet (600 mg total) by mouth every 6 (six) hours as needed. Patient not taking: Reported on 08/15/2016 12/31/15   Sable Feil, PA-C  ketorolac (TORADOL) 10 MG tablet Take 1 tablet (10 mg total) by mouth every 8 (eight) hours. Patient not taking:  Reported on 08/15/2016 07/28/16   Dannielle Karvonen Menshew, PA-C  lidocaine (LIDODERM) 5 % Place 1 patch onto the skin every 12 (twelve) hours. Remove & Discard patch within 12 hours or as directed by MD 07/22/16 07/22/17  Nance Pear, MD  lisinopril (PRINIVIL,ZESTRIL) 20 MG tablet Take 20 mg by mouth every morning.     Historical Provider, MD  LORazepam (ATIVAN) 1 MG tablet Take 1 tablet (1 mg total) by mouth 2 (two) times daily as needed for anxiety. Patient not taking: Reported on 04/30/2016 04/01/16   Daymon Larsen, MD  naproxen (EC NAPROSYN) 500 MG EC tablet Take 1 tablet (500 mg total) by mouth 2 (two) times daily with a meal. Patient not taking: Reported on 08/15/2016 02/13/16   Dannielle Karvonen Menshew, PA-C  ondansetron (ZOFRAN ODT) 4 MG disintegrating tablet Take 1 tablet (4 mg total) by mouth every 8 (eight) hours as needed for nausea or vomiting. 11/19/16   Loney Hering, MD  ondansetron (ZOFRAN) 4 MG tablet Take 1 tablet (4 mg total) by mouth every 8 (eight) hours as needed for nausea or vomiting. 11/16/16   Nance Pear, MD  oxyCODONE-acetaminophen (ROXICET) 5-325 MG tablet Take 1 tablet by mouth every 6 (six) hours as needed. Patient not taking: Reported on 08/15/2016 07/27/16   Loney Hering, MD  potassium chloride (K-DUR) 10 MEQ tablet Take 1 tablet (10 mEq total) by mouth daily. Patient not taking: Reported on 08/15/2016 04/30/16   Larene Beach A McGowan, PA-C  predniSONE (DELTASONE) 20 MG tablet Take 3 tablets (60 mg total) by mouth daily. Patient not taking: Reported on 08/15/2016 05/02/16   Hinda Kehr, MD    Allergies Patient has no known allergies.  No family history on file.  Social History Social History  Substance Use Topics  . Smoking status: Former Research scientist (life sciences)  . Smokeless tobacco: Never Used  . Alcohol use Yes    Review of Systems Constitutional: No fever/chills Eyes: No visual changes. ENT: No sore throat. Cardiovascular: Denies chest pain. Respiratory: Denies  shortness of breath. Gastrointestinal: No vomiting.  No diarrhea.  No constipation. Genitourinary: Negative for dysuria. Musculoskeletal: Negative for back pain. Skin: Negative for rash. Neurological: Negative for headaches, focal weakness or numbness.  10-point ROS otherwise negative.  ____________________________________________   PHYSICAL EXAM:  VITAL SIGNS: ED Triage Vitals [11/20/16 1609]  Enc Vitals Group     BP (!) 142/80     Pulse Rate 98     Resp 16     Temp 97.9 F (36.6 C)     Temp Source Oral     SpO2 94 %     Weight      Height      Head Circumference      Peak Flow      Pain Score 8     Pain Loc      Pain Edu?      Excl. in  [place]?     Constitutional: Alert and oriented. Somewhat chronically ill-appearing, slightly tremulous and slightly anxious. Eyes: Conjunctivae are normal. PERRL. EOMI. Head: Atraumatic. Nose: No congestion/rhinnorhea. Mouth/Throat: Mucous membranes are moist.  Oropharynx non-erythematous. Neck: No stridor.   Cardiovascular: Normal rate, regular rhythm. Grossly normal heart sounds.  Good peripheral circulation. Respiratory: Normal respiratory effort.  No retractions. Lungs CTAB. Gastrointestinal: Soft and nontender except for moderate tenderness along the right flank region without rebound or guarding. No distention. No abdominal bruits. No CVA tenderness. Musculoskeletal: No lower extremity tenderness nor edema.  No joint effusions. High-frequency relatively mild bilateral upper extremity tremors. Neurologic:  Normal speech and language. No gross focal neurologic deficits are appreciated. No gait instability. Skin:  Skin is warm, dry and intact. No rash noted. Psychiatric: Mood and affect are normal. Speech and behavior are normal.  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 No significant change from previous. ____________________________________________  RADIOLOGY  Ct Abdomen Pelvis W Contrast  Result Date: 11/20/2016 CLINICAL DATA:  Initial evaluation for acute right-sided abdominal pain. EXAM: CT ABDOMEN AND PELVIS WITH CONTRAST TECHNIQUE: Multidetector CT imaging of the abdomen and pelvis was performed using the standard protocol following bolus administration of intravenous contrast. CONTRAST:  175mL ISOVUE-300 IOPAMIDOL (ISOVUE-300) INJECTION 61% COMPARISON:  Comparison made with recent CT from 11/19/2015. FINDINGS: Lower chest: Mild scattered atelectatic changes noted within the visualized lung bases. Visualized lungs are otherwise clear. No pleural or pericardial fusion. Hepatobiliary: Subcentimeter hypodensity within the posterior right hepatic lobe noted, too small the characterize, but may reflect a small cyst. Liver is otherwise unremarkable. Gallbladder is absent. No biliary dilatation. Pancreas: Pancreas within normal limits. Spleen: Spleen within normal limits. Adrenals/Urinary Tract: Adrenal glands are normal. Kidneys equal in size with symmetric enhancement. Scattered bilateral nonobstructive nephrolithiasis. No hydronephrosis. No focal enhancing renal mass. Tiny hypodensity at the lower pole the right kidney noted, too small the characterize. No hydroureter. Bladder within normal limits. Stomach/Bowel: Stomach within normal limits. Small duodenal diverticulum noted  arising from the third portion of the duodenum. No evidence for bowel obstruction. Appendix well visualized within the right lower quadrant and is of normal caliber and appearance associated inflammatory changes to suggest acute appendicitis. Colonic diverticulosis without evidence for acute diverticulitis. Cecum and ascending colon are incompletely distended with secondary mild circumferential wall thickening, stable from prior. Possible mild colitis could considered in the correct clinical setting. Overall appearance is stable from recent CT. No other acute inflammatory changes elsewhere about the bowels. Vascular/Lymphatic: Normal intravascular enhancement seen throughout the intra-abdominal aorta and its branch vessels. No adenopathy. Reproductive: Prostate within normal limits. Other: No free air or fluid. Fat containing ventral hernia noted without inflammation. Musculoskeletal: No acute osseous abnormality. No worrisome lytic or blastic osseous lesions. Mild degenerative disc bulging noted at L5-S1. IMPRESSION: 1. Mild wall thickening about the cecum and ascending colon, favored  to be related to incomplete distension, although possible mild acute colitis could be considered in the correct clinical setting. 2. Normal appendix. 3. Colonic diverticulosis without evidence for acute diverticulitis. 4. Bilateral nonobstructive nephrolithiasis. Electronically Signed   By: Jeannine Boga M.D.   On: 11/20/2016 20:36    ____________________________________________   PROCEDURES  Procedure(s) performed: None  Procedures  Critical Care performed: No  ____________________________________________   INITIAL IMPRESSION / ASSESSMENT AND PLAN / ED COURSE  Pertinent labs & imaging results that were available during my care of the patient were reviewed by me and considered in my medical decision making (see chart for details).  Differential diagnosis includes but is not limited to, abdominal  perforation, aortic dissection, cholecystitis, appendicitis, diverticulitis, colitis, esophagitis/gastritis, kidney stone, pyelonephritis, urinary tract infection, aortic aneurysm. All are considered in decision and treatment plan. Based upon the patient's presentation and risk factors, I suspect some of his symptomatology is due to alcohol withdrawal which he readily admits to, but does report that his symptoms are getting better than they were a couple days ago and he is currently on which are all treatment which she reports compliance with. He does have focal right flank tenderness, and was seen yesterday and urinalysis/CT and labs were reassuring at that time but he continues to report ongoing pain. Today we'll perform CT with contrast to better highlight the bowels, look for possible inflammatory changes, and a possible left knee for his symptomatology.  ----------------------------------------- 9:50 PM on 11/20/2016 -----------------------------------------  Patient reports feeling much better. No longer having any tremulousness. CT scan reassuring. Return precautions and treatment recommendations and follow-up discussed with the patient who is agreeable with the plan.  Patient appears appropriate for ongoing outpatient follow-up and he is not driving.   Clinical Course      ____________________________________________   FINAL CLINICAL IMPRESSION(S) / ED DIAGNOSES  Final diagnoses:  Generalized abdominal pain      NEW MEDICATIONS STARTED DURING THIS VISIT:  Discharge Medication List as of 11/20/2016  9:37 PM       Note:  This document was prepared using Dragon voice recognition software and may include unintentional dictation errors.     Delman Kitten, MD 11/20/16 2151

## 2016-11-20 NOTE — ED Triage Notes (Signed)
Patient to Ed via POV for c/o abdominal pain and "not breathing good" patient states that his symptoms started 2 days ago. Patient states that he is also having headaches and shaking very badly. No tremors noted.

## 2016-11-20 NOTE — Discharge Instructions (Signed)
You were seen in the emergency room for abdominal pain. It is important that you follow up closely with your primary care doctor in the next couple of days. ° °If you're unable to see her primary care doctor you may return to the emergency room or go to the [facility] walk-in clinic in 1 or 2 days for reexam. ° °Please return to the emergency room right away if you are to develop a fever, severe nausea, your pain becomes severe or worsens, you are unable to keep food down, begin vomiting any dark or bloody fluid, you develop any dark or bloody stools, feel dehydrated, or other new concerns or symptoms arise. ° °

## 2016-12-01 ENCOUNTER — Encounter: Payer: Self-pay | Admitting: Emergency Medicine

## 2016-12-01 DIAGNOSIS — M791 Myalgia: Secondary | ICD-10-CM | POA: Insufficient documentation

## 2016-12-01 DIAGNOSIS — Y939 Activity, unspecified: Secondary | ICD-10-CM | POA: Insufficient documentation

## 2016-12-01 DIAGNOSIS — Y92009 Unspecified place in unspecified non-institutional (private) residence as the place of occurrence of the external cause: Secondary | ICD-10-CM | POA: Insufficient documentation

## 2016-12-01 DIAGNOSIS — Z87891 Personal history of nicotine dependence: Secondary | ICD-10-CM | POA: Insufficient documentation

## 2016-12-01 DIAGNOSIS — S80212A Abrasion, left knee, initial encounter: Secondary | ICD-10-CM | POA: Insufficient documentation

## 2016-12-01 DIAGNOSIS — I10 Essential (primary) hypertension: Secondary | ICD-10-CM | POA: Insufficient documentation

## 2016-12-01 DIAGNOSIS — Y999 Unspecified external cause status: Secondary | ICD-10-CM | POA: Insufficient documentation

## 2016-12-01 DIAGNOSIS — J45909 Unspecified asthma, uncomplicated: Secondary | ICD-10-CM | POA: Insufficient documentation

## 2016-12-01 DIAGNOSIS — S80211A Abrasion, right knee, initial encounter: Secondary | ICD-10-CM | POA: Insufficient documentation

## 2016-12-01 DIAGNOSIS — W010XXA Fall on same level from slipping, tripping and stumbling without subsequent striking against object, initial encounter: Secondary | ICD-10-CM | POA: Insufficient documentation

## 2016-12-01 NOTE — ED Triage Notes (Signed)
Pt ambulatory to triage with slow gait. Pt reports he tripped over a wooden piece on a wheelchair ramp at the home. Pt c/o left knee, right hip and right shoulder pain. On assessment pt has abrasions to the left knee.

## 2016-12-02 ENCOUNTER — Emergency Department
Admission: EM | Admit: 2016-12-02 | Discharge: 2016-12-02 | Disposition: A | Discharge status: Home / Self Care | Payer: Self-pay | Attending: Emergency Medicine | Admitting: Emergency Medicine

## 2016-12-02 DIAGNOSIS — M7918 Myalgia, other site: Secondary | ICD-10-CM

## 2016-12-02 MED ORDER — IBUPROFEN 800 MG PO TABS
800.0000 mg | ORAL_TABLET | Freq: Once | ORAL | Status: AC
  Administered 2016-12-02: 800 mg via ORAL

## 2016-12-02 MED ORDER — IBUPROFEN 800 MG PO TABS
ORAL_TABLET | ORAL | Status: AC
  Administered 2016-12-02: 800 mg via ORAL
  Filled 2016-12-02: qty 1

## 2016-12-02 NOTE — ED Provider Notes (Signed)
[facility] Emergency Department Provider Note    First MD Initiated Contact with Patient 12/02/16 [phone]     (approximate)  I have reviewed the triage vital signs and the nursing notes.   HISTORY  Chief Complaint Fall    HPI Miguel [name] is a 56 y.o. male presents with history of accidental trip and fall yesterday morning with "I'm hurting everywhere".    Past Medical History:  Diagnosis Date  . Asthma   . GERD (gastroesophageal reflux disease)   . Gout   . Hypertension   . OCD (obsessive compulsive disorder)   . Renal colic     Patient Active Problem List   Diagnosis Date Noted  . Moderate major depression, single episode ([place]) 08/15/2016  . Substance induced mood disorder ([place]) 08/15/2016  . Self-inflicted injury [id number]  . Involuntary commitment 08/15/2016  . Alcohol abuse 02/05/2016  . Hypertension 12/05/2015  . Tachycardia 12/05/2015  . Gout 11/13/2015  . Chronic back pain 05/02/2015    Past Surgical History:  Procedure Laterality Date  . CHOLECYSTECTOMY  2012    Prior to Admission medications   Medication Sig Start Date End Date Taking? Authorizing Provider  albuterol (VENTOLIN HFA) 108 (90 Base) MCG/ACT inhaler Inhale 2 puffs into the lungs every 4 (four) hours as needed for wheezing or shortness of breath.    Historical Provider, MD  allopurinol (ZYLOPRIM) 300 MG tablet Take 300 mg by mouth every morning.    Historical Provider, MD  chlordiazePOXIDE (LIBRIUM) 10 MG capsule Take 1 capsule (10 mg total) by mouth 3 (three) times daily as needed for anxiety or withdrawal. 06/30/16   Carrie Mew, MD  chlordiazePOXIDE (LIBRIUM) 25 MG capsule Day 1 - 50 mg every 6 hours Day 2 - 25 mg every 6 hours Day 3 - 25 mg twice a day Day 4- 25 mg at night 11/16/16   Nance Pear, MD  colchicine 0.6 MG tablet 1 tablet once or twice a day with food 04/30/16 05/31/16  Larene Beach A McGowan, PA-C  cyclobenzaprine (FLEXERIL) 5 MG tablet Take 1  tablet (5 mg total) by mouth 3 (three) times daily as needed for muscle spasms. Patient not taking: Reported on 08/15/2016 07/28/16   Dannielle Karvonen Menshew, PA-C  dexlansoprazole (DEXILANT) 60 MG capsule Take 60 mg by mouth every morning.    Historical Provider, MD  etodolac (LODINE) 200 MG capsule Take 1 capsule (200 mg total) by mouth every 8 (eight) hours. Patient not taking: Reported on 08/15/2016 07/27/16   Loney Hering, MD  etodolac (LODINE) 200 MG capsule Take 1 capsule (200 mg total) by mouth every 8 (eight) hours. 11/19/16   Loney Hering, MD  FLUoxetine (PROZAC) 20 MG capsule Take 1 capsule (20 mg total) by mouth daily. 08/15/16   Gonzella Lex, MD  Fluticasone-Salmeterol (ADVAIR) 100-50 MCG/DOSE AEPB Inhale 1 puff into the lungs 2 (two) times daily.    Historical Provider, MD  HYDROcodone-acetaminophen (NORCO/VICODIN) 5-325 MG tablet Take 1-2 tablets by mouth every 4 (four) hours as needed for moderate pain. Patient not taking: Reported on 08/15/2016 05/02/16   Hinda Kehr, MD  ibuprofen (ADVIL,MOTRIN) 600 MG tablet Take 1 tablet (600 mg total) by mouth every 6 (six) hours as needed. Patient not taking: Reported on 08/15/2016 12/31/15   Sable Feil, PA-C  ketorolac (TORADOL) 10 MG tablet Take 1 tablet (10 mg total) by mouth every 8 (eight) hours. Patient not taking: Reported on 08/15/2016 07/28/16   [place]annon [place]  V Bacon Menshew, PA-C  lidocaine (LIDODERM) 5 % Place 1 patch onto the skin every 12 (twelve) hours. Remove & Discard patch within 12 hours or as directed by MD 07/22/16 07/22/17  Nance Pear, MD  lisinopril (PRINIVIL,ZESTRIL) 20 MG tablet Take 20 mg by mouth every morning.     Historical Provider, MD  LORazepam (ATIVAN) 1 MG tablet Take 1 tablet (1 mg total) by mouth 2 (two) times daily as needed for anxiety. Patient not taking: Reported on 04/30/2016 04/01/16   Daymon Larsen, MD  naproxen (EC NAPROSYN) 500 MG EC tablet Take 1 tablet (500 mg total) by mouth 2 (two) times daily with  a meal. Patient not taking: Reported on 08/15/2016 02/13/16   Dannielle Karvonen Menshew, PA-C  ondansetron (ZOFRAN ODT) 4 MG disintegrating tablet Take 1 tablet (4 mg total) by mouth every 8 (eight) hours as needed for nausea or vomiting. 11/19/16   Loney Hering, MD  ondansetron (ZOFRAN) 4 MG tablet Take 1 tablet (4 mg total) by mouth every 8 (eight) hours as needed for nausea or vomiting. 11/16/16   Nance Pear, MD  oxyCODONE-acetaminophen (ROXICET) 5-325 MG tablet Take 1 tablet by mouth every 6 (six) hours as needed. Patient not taking: Reported on 08/15/2016 07/27/16   Loney Hering, MD  potassium chloride (K-DUR) 10 MEQ tablet Take 1 tablet (10 mEq total) by mouth daily. Patient not taking: Reported on 08/15/2016 04/30/16   Larene Beach A McGowan, PA-C  predniSONE (DELTASONE) 20 MG tablet Take 3 tablets (60 mg total) by mouth daily. Patient not taking: Reported on 08/15/2016 05/02/16   Hinda Kehr, MD    Allergies Patient has no known allergies.  No family history on file.  Social History Social History  Substance Use Topics  . Smoking status: Former Research scientist (life sciences)  . Smokeless tobacco: Never Used  . Alcohol use Yes    Review of Systems Constitutional: No fever/chills Eyes: No visual changes. ENT: No sore throat. Cardiovascular: Denies chest pain. Respiratory: Denies shortness of breath. Gastrointestinal: No abdominal pain.  No nausea, no vomiting.  No diarrhea.  No constipation. Genitourinary: Negative for dysuria. Musculoskeletal: Positive for generalized muscle pain Skin: Negative for rash. Positive bilateral extremity abrasion Neurological: Negative for headaches, focal weakness or numbness.  10-point ROS otherwise negative.  ____________________________________________   PHYSICAL EXAM:  VITAL SIGNS: ED Triage Vitals [12/01/16 2331]  Enc Vitals Group     BP (!) 141/78     Pulse Rate 78     Resp      Temp 98.1 F (36.7 C)     Temp Source Oral     SpO2 100 %     Weight        Height      Head Circumference      Peak Flow      Pain Score      Pain Loc      Pain Edu?      Excl. in [place]?     Constitutional: Alert and oriented. Well appearing and in no acute distress. Eyes: Conjunctivae are normal. PERRL. EOMI. Head: Atraumatic. Mouth/Throat: Mucous membranes are moist.  Oropharynx non-erythematous. Neck: No stridor.  Cardiovascular: Normal rate, regular rhythm. Good peripheral circulation. Grossly normal heart sounds. Respiratory: Normal respiratory effort.  No retractions. Lungs CTAB. Gastrointestinal: Soft and nontender. No distention.  Musculoskeletal: No lower extremity tenderness nor edema. No gross deformities of extremities. Neurologic:  Normal speech and language. No gross focal neurologic deficits are appreciated.  Skin:  Bilateral lower extremity abrasion knees and anterior leg      Procedures     INITIAL IMPRESSION / ASSESSMENT AND PLAN / ED COURSE  Pertinent labs & imaging results that were available during my care of the patient were reviewed by me and considered in my medical decision making (see chart for details).     Clinical Course     ____________________________________________  FINAL CLINICAL IMPRESSION(S) / ED DIAGNOSES  Final diagnoses:  Musculoskeletal pain     MEDICATIONS GIVEN DURING THIS VISIT:  Medications - No data to display   NEW OUTPATIENT MEDICATIONS STARTED DURING THIS VISIT:  New Prescriptions   No medications on file    Modified Medications   No medications on file    Discontinued Medications   No medications on file     Note:  This document was prepared using Dragon voice recognition software and may include unintentional dictation errors.    Gregor Hams, MD 12/02/16 [phone]

## 2016-12-02 NOTE — ED Notes (Signed)
Dr. Owens Shark at bedside for pt evaluation

## 2016-12-07 ENCOUNTER — Emergency Department
Admission: EM | Admit: 2016-12-07 | Discharge: 2016-12-08 | Disposition: A | Discharge status: Home / Self Care | Payer: Self-pay | Attending: Emergency Medicine | Admitting: Emergency Medicine

## 2016-12-07 ENCOUNTER — Emergency Department: Payer: Self-pay

## 2016-12-07 ENCOUNTER — Other Ambulatory Visit: Payer: Self-pay

## 2016-12-07 DIAGNOSIS — I10 Essential (primary) hypertension: Secondary | ICD-10-CM | POA: Insufficient documentation

## 2016-12-07 DIAGNOSIS — F10920 Alcohol use, unspecified with intoxication, uncomplicated: Secondary | ICD-10-CM

## 2016-12-07 DIAGNOSIS — S0990XA Unspecified injury of head, initial encounter: Secondary | ICD-10-CM | POA: Insufficient documentation

## 2016-12-07 DIAGNOSIS — Z23 Encounter for immunization: Secondary | ICD-10-CM | POA: Insufficient documentation

## 2016-12-07 DIAGNOSIS — F10120 Alcohol abuse with intoxication, uncomplicated: Secondary | ICD-10-CM | POA: Insufficient documentation

## 2016-12-07 DIAGNOSIS — X58XXXA Exposure to other specified factors, initial encounter: Secondary | ICD-10-CM | POA: Insufficient documentation

## 2016-12-07 DIAGNOSIS — Y999 Unspecified external cause status: Secondary | ICD-10-CM | POA: Insufficient documentation

## 2016-12-07 DIAGNOSIS — Y929 Unspecified place or not applicable: Secondary | ICD-10-CM | POA: Insufficient documentation

## 2016-12-07 DIAGNOSIS — Y939 Activity, unspecified: Secondary | ICD-10-CM | POA: Insufficient documentation

## 2016-12-07 DIAGNOSIS — J45909 Unspecified asthma, uncomplicated: Secondary | ICD-10-CM | POA: Insufficient documentation

## 2016-12-07 DIAGNOSIS — S91311A Laceration without foreign body, right foot, initial encounter: Secondary | ICD-10-CM | POA: Insufficient documentation

## 2016-12-07 DIAGNOSIS — Z87891 Personal history of nicotine dependence: Secondary | ICD-10-CM | POA: Insufficient documentation

## 2016-12-07 LAB — BASIC METABOLIC PANEL
Anion gap: 9 (ref 5–15)
BUN: 7 mg/dL (ref 6–20)
CO2: 27 mmol/L (ref 22–32)
Calcium: 8.8 mg/dL — ABNORMAL LOW (ref 8.9–10.3)
Chloride: 109 mmol/L (ref 101–111)
Creatinine, Ser: 1 mg/dL (ref 0.61–1.24)
GFR calc Af Amer: >60 mL/min (ref >60)
GFR calc non Af Amer: >60 mL/min (ref >60)
Glucose, Bld: 122 mg/dL — ABNORMAL HIGH (ref 65–99)
Potassium: 3.1 mmol/L — ABNORMAL LOW (ref 3.5–5.1)
Sodium: 145 mmol/L (ref 135–145)

## 2016-12-07 LAB — CBC
HCT: 44.1 % (ref 40.0–52.0)
Hemoglobin: 14.8 g/dL (ref 13.0–18.0)
MCH: 31.5 pg (ref 26.0–34.0)
MCHC: 33.5 g/dL (ref 32.0–36.0)
MCV: 93.9 fL (ref 80.0–100.0)
Platelets: 221 10*3/uL (ref 150–440)
RBC: 4.7 MIL/uL (ref 4.40–5.90)
RDW: 14.4 % (ref 11.5–14.5)
WBC: 6.7 10*3/uL (ref 3.8–10.6)

## 2016-12-07 LAB — ETHANOL: Alcohol, Ethyl (B): 401 mg/dL (ref <5)

## 2016-12-07 MED ORDER — TETANUS-DIPHTH-ACELL PERTUSSIS 5-2.5-18.5 LF-MCG/0.5 IM SUSP
0.5000 mL | Freq: Once | INTRAMUSCULAR | Status: AC
  Administered 2016-12-08: 0.5 mL via INTRAMUSCULAR
  Filled 2016-12-07: qty 0.5

## 2016-12-07 MED ORDER — LIDOCAINE HCL (PF) 1 % IJ SOLN
INTRAMUSCULAR | Status: AC
  Filled 2016-12-07: qty 5

## 2016-12-07 NOTE — ED Notes (Signed)
Pt sts he had "2 wines"

## 2016-12-07 NOTE — ED Notes (Signed)
Pt returned from CT °

## 2016-12-07 NOTE — ED Notes (Signed)
Updated pt's wife on phone.  

## 2016-12-07 NOTE — ED Notes (Signed)
Pt restless, refuses to stay in bed. Continues to try and get out of bed, while picking at wires.  Pt talking, tearful, continues to say that he's sorry but refuses to follow direction. Able to follow simple commands but poor safety awareness.

## 2016-12-07 NOTE — ED Triage Notes (Signed)
Pt bib EMS w/ c/o R foot laceration.  Pt unable to give coherent story on how he was injured.  Pt unable to provide basic information like birthday.  Pt is alert and speaking, able to state that he lives w/ wife.  Pt able to move all limbs, PERRLA.  Pt tearful.

## 2016-12-07 NOTE — ED Notes (Signed)
asst PA student with suturing pts foot.  Pt tolerated well.  Kept slapping himself in the face and apologizing.  Pt resting

## 2016-12-07 NOTE — ED Provider Notes (Signed)
[facility] Emergency Department Provider Note  Time seen: 9:56 PM  I have reviewed the triage vital signs and the nursing notes.   HISTORY  Chief Complaint Laceration and Foot Injury    HPI Miguel [name] is a 56 y.o. male with a past medical history of asthma, hypertension, gastric reflux, substance use, presents to the emergency department for a right foot laceration and intoxication. Patient presents via EMS with a right foot laceration with bleeding. Patient admits alcohol use and is quite intoxicated. Slurred speech, cannot tell me how he injured the foot, is tearful at times, patient is quite intoxicated appearing. No other trauma identified on exam besides a mild contusion to the right mid back. No head trauma identified. Patient cannot contribute to his history at this time.  Past Medical History:  Diagnosis Date  . Asthma   . GERD (gastroesophageal reflux disease)   . Gout   . Hypertension   . OCD (obsessive compulsive disorder)   . Renal colic     Patient Active Problem List   Diagnosis Date Noted  . Moderate major depression, single episode ([place]) 08/15/2016  . Substance induced mood disorder ([place]) 08/15/2016  . Self-inflicted injury [id number]  . Involuntary commitment 08/15/2016  . Alcohol abuse 02/05/2016  . Hypertension 12/05/2015  . Tachycardia 12/05/2015  . Gout 11/13/2015  . Chronic back pain 05/02/2015    Past Surgical History:  Procedure Laterality Date  . CHOLECYSTECTOMY  2012    Prior to Admission medications   Medication Sig Start Date End Date Taking? Authorizing Provider  albuterol (VENTOLIN HFA) 108 (90 Base) MCG/ACT inhaler Inhale 2 puffs into the lungs every 4 (four) hours as needed for wheezing or shortness of breath.    Historical Provider, MD  allopurinol (ZYLOPRIM) 300 MG tablet Take 300 mg by mouth every morning.    Historical Provider, MD  chlordiazePOXIDE (LIBRIUM) 10 MG capsule Take 1 capsule (10 mg total) by  mouth 3 (three) times daily as needed for anxiety or withdrawal. 06/30/16   Carrie Mew, MD  chlordiazePOXIDE (LIBRIUM) 25 MG capsule Day 1 - 50 mg every 6 hours Day 2 - 25 mg every 6 hours Day 3 - 25 mg twice a day Day 4- 25 mg at night 11/16/16   Nance Pear, MD  colchicine 0.6 MG tablet 1 tablet once or twice a day with food 04/30/16 05/31/16  Larene Beach A McGowan, PA-C  cyclobenzaprine (FLEXERIL) 5 MG tablet Take 1 tablet (5 mg total) by mouth 3 (three) times daily as needed for muscle spasms. Patient not taking: Reported on 08/15/2016 07/28/16   Dannielle Karvonen Menshew, PA-C  dexlansoprazole (DEXILANT) 60 MG capsule Take 60 mg by mouth every morning.    Historical Provider, MD  etodolac (LODINE) 200 MG capsule Take 1 capsule (200 mg total) by mouth every 8 (eight) hours. Patient not taking: Reported on 08/15/2016 07/27/16   Loney Hering, MD  etodolac (LODINE) 200 MG capsule Take 1 capsule (200 mg total) by mouth every 8 (eight) hours. 11/19/16   Loney Hering, MD  FLUoxetine (PROZAC) 20 MG capsule Take 1 capsule (20 mg total) by mouth daily. 08/15/16   Gonzella Lex, MD  Fluticasone-Salmeterol (ADVAIR) 100-50 MCG/DOSE AEPB Inhale 1 puff into the lungs 2 (two) times daily.    Historical Provider, MD  HYDROcodone-acetaminophen (NORCO/VICODIN) 5-325 MG tablet Take 1-2 tablets by mouth every 4 (four) hours as needed for moderate pain. Patient not taking: Reported on 08/15/2016 05/02/16  Hinda Kehr, MD  ibuprofen (ADVIL,MOTRIN) 600 MG tablet Take 1 tablet (600 mg total) by mouth every 6 (six) hours as needed. Patient not taking: Reported on 08/15/2016 12/31/15   Sable Feil, PA-C  ketorolac (TORADOL) 10 MG tablet Take 1 tablet (10 mg total) by mouth every 8 (eight) hours. Patient not taking: Reported on 08/15/2016 07/28/16   Dannielle Karvonen Menshew, PA-C  lidocaine (LIDODERM) 5 % Place 1 patch onto the skin every 12 (twelve) hours. Remove & Discard patch within 12 hours or as directed by MD  07/22/16 07/22/17  Nance Pear, MD  lisinopril (PRINIVIL,ZESTRIL) 20 MG tablet Take 20 mg by mouth every morning.     Historical Provider, MD  LORazepam (ATIVAN) 1 MG tablet Take 1 tablet (1 mg total) by mouth 2 (two) times daily as needed for anxiety. Patient not taking: Reported on 04/30/2016 04/01/16   Daymon Larsen, MD  naproxen (EC NAPROSYN) 500 MG EC tablet Take 1 tablet (500 mg total) by mouth 2 (two) times daily with a meal. Patient not taking: Reported on 08/15/2016 02/13/16   Dannielle Karvonen Menshew, PA-C  ondansetron (ZOFRAN ODT) 4 MG disintegrating tablet Take 1 tablet (4 mg total) by mouth every 8 (eight) hours as needed for nausea or vomiting. 11/19/16   Loney Hering, MD  ondansetron (ZOFRAN) 4 MG tablet Take 1 tablet (4 mg total) by mouth every 8 (eight) hours as needed for nausea or vomiting. 11/16/16   Nance Pear, MD  oxyCODONE-acetaminophen (ROXICET) 5-325 MG tablet Take 1 tablet by mouth every 6 (six) hours as needed. Patient not taking: Reported on 08/15/2016 07/27/16   Loney Hering, MD  potassium chloride (K-DUR) 10 MEQ tablet Take 1 tablet (10 mEq total) by mouth daily. Patient not taking: Reported on 08/15/2016 04/30/16   Larene Beach A McGowan, PA-C  predniSONE (DELTASONE) 20 MG tablet Take 3 tablets (60 mg total) by mouth daily. Patient not taking: Reported on 08/15/2016 05/02/16   Hinda Kehr, MD    No Known Allergies  No family history on file.  Social History Social History  Substance Use Topics  . Smoking status: Former Research scientist (life sciences)  . Smokeless tobacco: Never Used  . Alcohol use Yes    Review of Systems To obtain a review of systems due to significant intoxication.  ____________________________________________   PHYSICAL EXAM:  VITAL SIGNS: ED Triage Vitals  Enc Vitals Group     BP 12/07/16 2118 (!) 164/97     Pulse Rate 12/07/16 2118 98     Resp 12/07/16 2118 20     Temp 12/07/16 2118 98.5 F (36.9 C)     Temp Source 12/07/16 2118 Oral     SpO2  12/07/16 2118 100 %     Weight 12/07/16 2119 200 lb (90.7 kg)     Height 12/07/16 2119 6' (1.829 m)     Head Circumference --      Peak Flow --      Pain Score 12/07/16 2119 0     Pain Loc --      Pain Edu? --      Excl. in [place]? --     Constitutional: Alert. Tearful. Overall well-appearing and in no distress. Patient has slurred speech, cannot make a coherent sentence. Eyes: Normal exam ENT   Head: Normocephalic and atraumatic.   Mouth/Throat: Mucous membranes are moist. Smells of alcohol. Cardiovascular: Normal rate, regular rhythm.  Respiratory: Normal respiratory effort without tachypnea nor retractions. Breath sounds are clear Gastrointestinal:  Soft and nontender. No distention. Musculoskeletal: Nontender with normal range of motion in all extremities.  Neurologic:  Moves all extremities, no gross deficits. Slurred speech, most consistent with acute intoxication. Skin:  Skin is warm, dry and intact.  Psychiatric: Slurred speech, incoherent. Tearful at times.  ____________________________________________   RADIOLOGY  CT head pending  ____________________________________________   INITIAL IMPRESSION / ASSESSMENT AND PLAN / ED COURSE  Pertinent labs & imaging results that were available during my care of the patient were reviewed by me and considered in my medical decision making (see chart for details).  The patient presents to the emergency department with likely alcohol intoxication and a right foot laceration. The patient does have an approximately 2 cm laceration to the dorsal aspect of the right foot. Neurovascularly intact distally. No signs of bony abnormality or deformity.  We will check labs, CT head, and laceration repair.  Labs show an elevated ethanol level of 401.  LACERATION REPAIR Performed by: Harvest Dark Authorized by: Harvest Dark Consent: Verbal consent obtained. Risks and benefits: risks, benefits and alternatives were  discussed Consent given by: patient Patient identity confirmed: provided demographic data Prepped and Draped in normal sterile fashion Wound explored  Laceration Location: right dorsal aspect of foot  Laceration Length: 2cm  No Foreign Bodies seen or palpated  Anesthesia: local infiltration  Local anesthetic: lidocaine 1% w/o epinephrine  Anesthetic total: 4 ml  Irrigation method: syringe Amount of cleaning: standard  Skin closure: sutures  Number of sutures: 3  Technique: simple interrupted  Patient tolerance: Patient tolerated the procedure well with no immediate complications.  We will continue to monitor in the emergency department until sober. At this time the patient does not express any concerning psychiatric issues. Patient care signed out to Dr. Alfred Levins. ____________________________________________   FINAL CLINICAL IMPRESSION(S) / ED DIAGNOSES  laceration alcohol intoxication    Harvest Dark, MD 12/07/16 2317

## 2016-12-07 NOTE — Telephone Encounter (Signed)
Received PAP application from [facility] for Colcrys 0.6 mg and Dexilant 60 mg placed for provider to sign.

## 2016-12-08 NOTE — ED Notes (Signed)
Wife called. Informed her that pt would be held here in ED pending sobriety.

## 2016-12-08 NOTE — Discharge Instructions (Signed)
Please seek medical attention for any high fevers, chest pain, shortness of breath, change in behavior, persistent vomiting, bloody stool or any other new or concerning symptoms.  

## 2016-12-10 NOTE — Telephone Encounter (Signed)
Placed signed application/script in [facility] folder for pickup. 

## 2016-12-30 ENCOUNTER — Telehealth: Payer: Self-pay | Admitting: Pharmacist

## 2016-12-30 NOTE — Telephone Encounter (Signed)
Faxed Re Enrollment application for [facility].

## 2016-12-31 ENCOUNTER — Emergency Department
Admission: EM | Admit: 2016-12-31 | Discharge: 2016-12-31 | Disposition: A | Discharge status: Home / Self Care | Payer: Self-pay | Attending: Emergency Medicine | Admitting: Emergency Medicine

## 2016-12-31 ENCOUNTER — Encounter: Payer: Self-pay | Admitting: Emergency Medicine

## 2016-12-31 DIAGNOSIS — Z87891 Personal history of nicotine dependence: Secondary | ICD-10-CM | POA: Insufficient documentation

## 2016-12-31 DIAGNOSIS — J45909 Unspecified asthma, uncomplicated: Secondary | ICD-10-CM | POA: Insufficient documentation

## 2016-12-31 DIAGNOSIS — N3001 Acute cystitis with hematuria: Secondary | ICD-10-CM | POA: Insufficient documentation

## 2016-12-31 DIAGNOSIS — I10 Essential (primary) hypertension: Secondary | ICD-10-CM | POA: Insufficient documentation

## 2016-12-31 DIAGNOSIS — Z79899 Other long term (current) drug therapy: Secondary | ICD-10-CM | POA: Insufficient documentation

## 2016-12-31 LAB — URINALYSIS, COMPLETE (UACMP) WITH MICROSCOPIC
Bilirubin Urine: NEGATIVE
Glucose, UA: NEGATIVE mg/dL
Ketones, ur: NEGATIVE mg/dL
Nitrite: NEGATIVE
Protein, ur: NEGATIVE mg/dL
Specific Gravity, Urine: 1.003 — ABNORMAL LOW (ref 1.005–1.030)
pH: 6 (ref 5.0–8.0)

## 2016-12-31 MED ORDER — MELOXICAM 7.5 MG PO TABS
7.5000 mg | ORAL_TABLET | Freq: Every day | ORAL | Status: DC
  Administered 2016-12-31: 7.5 mg via ORAL
  Filled 2016-12-31: qty 1

## 2016-12-31 MED ORDER — CEFTRIAXONE SODIUM 1 G IJ SOLR
1.0000 g | Freq: Once | INTRAMUSCULAR | Status: AC
  Administered 2016-12-31: 1 g via INTRAMUSCULAR
  Filled 2016-12-31: qty 10

## 2016-12-31 MED ORDER — SULFAMETHOXAZOLE-TRIMETHOPRIM 800-160 MG PO TABS: 1.0000 | ORAL_TABLET | Freq: Two times a day (BID) | ORAL | 0 refills | Status: DC

## 2016-12-31 NOTE — ED Triage Notes (Signed)
Pt to ED via POV c/o lower back pain x3days and unable to sleep due to discomfort.  Pt ambulatory. NAD noted
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
## 2017-01-01 ENCOUNTER — Emergency Department
Admission: EM | Admit: 2017-01-01 | Discharge: 2017-01-01 | Disposition: A | Discharge status: Home / Self Care | Payer: Self-pay | Attending: Emergency Medicine | Admitting: Emergency Medicine

## 2017-01-01 ENCOUNTER — Emergency Department
Admission: EM | Admit: 2017-01-01 | Discharge: 2017-01-02 | Disposition: A | Discharge status: Home / Self Care | Payer: Self-pay | Attending: Emergency Medicine | Admitting: Emergency Medicine

## 2017-01-01 ENCOUNTER — Emergency Department: Payer: Self-pay

## 2017-01-01 ENCOUNTER — Encounter: Payer: Self-pay | Admitting: Emergency Medicine

## 2017-01-01 DIAGNOSIS — W260XXA Contact with knife, initial encounter: Secondary | ICD-10-CM | POA: Insufficient documentation

## 2017-01-01 DIAGNOSIS — F10120 Alcohol abuse with intoxication, uncomplicated: Secondary | ICD-10-CM | POA: Insufficient documentation

## 2017-01-01 DIAGNOSIS — Z87891 Personal history of nicotine dependence: Secondary | ICD-10-CM | POA: Insufficient documentation

## 2017-01-01 DIAGNOSIS — Z791 Long term (current) use of non-steroidal anti-inflammatories (NSAID): Secondary | ICD-10-CM | POA: Insufficient documentation

## 2017-01-01 DIAGNOSIS — J45909 Unspecified asthma, uncomplicated: Secondary | ICD-10-CM | POA: Insufficient documentation

## 2017-01-01 DIAGNOSIS — Y9389 Activity, other specified: Secondary | ICD-10-CM | POA: Insufficient documentation

## 2017-01-01 DIAGNOSIS — Z79899 Other long term (current) drug therapy: Secondary | ICD-10-CM | POA: Insufficient documentation

## 2017-01-01 DIAGNOSIS — I10 Essential (primary) hypertension: Secondary | ICD-10-CM | POA: Insufficient documentation

## 2017-01-01 DIAGNOSIS — Y929 Unspecified place or not applicable: Secondary | ICD-10-CM | POA: Insufficient documentation

## 2017-01-01 DIAGNOSIS — F10929 Alcohol use, unspecified with intoxication, unspecified: Secondary | ICD-10-CM

## 2017-01-01 DIAGNOSIS — S61412A Laceration without foreign body of left hand, initial encounter: Secondary | ICD-10-CM | POA: Insufficient documentation

## 2017-01-01 DIAGNOSIS — Y999 Unspecified external cause status: Secondary | ICD-10-CM | POA: Insufficient documentation

## 2017-01-01 LAB — ETHANOL
Alcohol, Ethyl (B): 331 mg/dL (ref <5)
Alcohol, Ethyl (B): 362 mg/dL (ref <5)

## 2017-01-01 LAB — CBC WITH DIFFERENTIAL/PLATELET
Basophils Absolute: 0 10*3/uL (ref 0–0.1)
Basophils Relative: 1 %
Eosinophils Absolute: 0.1 10*3/uL (ref 0–0.7)
Eosinophils Relative: 1 %
HCT: 44.1 % (ref 40.0–52.0)
Hemoglobin: 14.6 g/dL (ref 13.0–18.0)
Lymphocytes Relative: 29 %
Lymphs Abs: 2.3 10*3/uL (ref 1.0–3.6)
MCH: 31.7 pg (ref 26.0–34.0)
MCHC: 33.1 g/dL (ref 32.0–36.0)
MCV: 95.6 fL (ref 80.0–100.0)
Monocytes Absolute: 0.6 10*3/uL (ref 0.2–1.0)
Monocytes Relative: 8 %
Neutro Abs: 4.8 10*3/uL (ref 1.4–6.5)
Neutrophils Relative %: 61 %
Platelets: 237 10*3/uL (ref 150–440)
RBC: 4.61 MIL/uL (ref 4.40–5.90)
RDW: 14.8 % — ABNORMAL HIGH (ref 11.5–14.5)
WBC: 7.9 10*3/uL (ref 3.8–10.6)

## 2017-01-01 LAB — COMPREHENSIVE METABOLIC PANEL
ALT: 17 U/L (ref 17–63)
ALT: 17 U/L (ref 17–63)
AST: 22 U/L (ref 15–41)
AST: 23 U/L (ref 15–41)
Albumin: 4.2 g/dL (ref 3.5–5.0)
Albumin: 4 g/dL (ref 3.5–5.0)
Alkaline Phosphatase: 85 U/L (ref 38–126)
Alkaline Phosphatase: 80 U/L (ref 38–126)
Anion gap: 12 (ref 5–15)
Anion gap: 11 (ref 5–15)
BUN: 8 mg/dL (ref 6–20)
BUN: 10 mg/dL (ref 6–20)
CO2: 26 mmol/L (ref 22–32)
CO2: 25 mmol/L (ref 22–32)
Calcium: 8.8 mg/dL — ABNORMAL LOW (ref 8.9–10.3)
Calcium: 8.8 mg/dL — ABNORMAL LOW (ref 8.9–10.3)
Chloride: 105 mmol/L (ref 101–111)
Chloride: 106 mmol/L (ref 101–111)
Creatinine, Ser: 1.08 mg/dL (ref 0.61–1.24)
Creatinine, Ser: 1.16 mg/dL (ref 0.61–1.24)
GFR calc Af Amer: >60 mL/min (ref >60)
GFR calc Af Amer: >60 mL/min (ref >60)
GFR calc non Af Amer: >60 mL/min (ref >60)
GFR calc non Af Amer: >60 mL/min (ref >60)
Glucose, Bld: 120 mg/dL — ABNORMAL HIGH (ref 65–99)
Glucose, Bld: 111 mg/dL — ABNORMAL HIGH (ref 65–99)
Potassium: 3.5 mmol/L (ref 3.5–5.1)
Potassium: 3.4 mmol/L — ABNORMAL LOW (ref 3.5–5.1)
Sodium: 143 mmol/L (ref 135–145)
Sodium: 142 mmol/L (ref 135–145)
Total Bilirubin: 0.5 mg/dL (ref 0.3–1.2)
Total Bilirubin: 0.5 mg/dL (ref 0.3–1.2)
Total Protein: 7.1 g/dL (ref 6.5–8.1)
Total Protein: 6.8 g/dL (ref 6.5–8.1)

## 2017-01-01 LAB — CBC
HCT: 46.4 % (ref 40.0–52.0)
Hemoglobin: 15.6 g/dL (ref 13.0–18.0)
MCH: 31.7 pg (ref 26.0–34.0)
MCHC: 33.7 g/dL (ref 32.0–36.0)
MCV: 94.1 fL (ref 80.0–100.0)
Platelets: 240 10*3/uL (ref 150–440)
RBC: 4.93 MIL/uL (ref 4.40–5.90)
RDW: 14.7 % — ABNORMAL HIGH (ref 11.5–14.5)
WBC: 9.1 10*3/uL (ref 3.8–10.6)

## 2017-01-01 LAB — ACETAMINOPHEN LEVEL: Acetaminophen (Tylenol), Serum: <10 ug/mL — ABNORMAL LOW (ref 10–30)

## 2017-01-01 LAB — SALICYLATE LEVEL: Salicylate Lvl: <7 mg/dL (ref 2.8–30.0)

## 2017-01-01 LAB — LIPASE, BLOOD: Lipase: 26 U/L (ref 11–51)

## 2017-01-01 MED ORDER — DIPHENHYDRAMINE HCL 50 MG/ML IJ SOLN: 50.0000 mg | Freq: Once | INTRAMUSCULAR | Status: DC

## 2017-01-01 MED ORDER — LORAZEPAM 2 MG/ML IJ SOLN: 2.0000 mg | Freq: Once | INTRAMUSCULAR | Status: DC

## 2017-01-01 MED ORDER — HALOPERIDOL LACTATE 5 MG/ML IJ SOLN: 5.0000 mg | Freq: Once | INTRAMUSCULAR | Status: DC

## 2017-01-01 MED ORDER — LORAZEPAM 2 MG/ML IJ SOLN
INTRAMUSCULAR | Status: AC
  Filled 2017-01-01: qty 1

## 2017-01-01 MED ORDER — DIPHENHYDRAMINE HCL 50 MG/ML IJ SOLN
INTRAMUSCULAR | Status: AC
  Filled 2017-01-01: qty 1

## 2017-01-01 MED ORDER — ONDANSETRON 4 MG PO TBDP
ORAL_TABLET | ORAL | Status: AC
  Administered 2017-01-01: 4 mg via ORAL
  Filled 2017-01-01: qty 1

## 2017-01-01 MED ORDER — HALOPERIDOL LACTATE 5 MG/ML IJ SOLN
INTRAMUSCULAR | Status: AC
  Filled 2017-01-01: qty 1

## 2017-01-01 MED ORDER — LIDOCAINE HCL (PF) 1 % IJ SOLN
INTRAMUSCULAR | Status: AC
  Administered 2017-01-01: 5 mL via INTRADERMAL
  Filled 2017-01-01: qty 5

## 2017-01-01 MED ORDER — LIDOCAINE HCL (PF) 1 % IJ SOLN
5.0000 mL | Freq: Once | INTRAMUSCULAR | Status: AC
  Administered 2017-01-01: 5 mL via INTRADERMAL

## 2017-01-01 MED ORDER — ONDANSETRON 4 MG PO TBDP
4.0000 mg | ORAL_TABLET | Freq: Once | ORAL | Status: AC
  Administered 2017-01-01: 4 mg via ORAL

## 2017-01-01 NOTE — ED Provider Notes (Signed)
[facility] Emergency Department Provider Note    ____________________________________________   I have reviewed the triage vital signs and the nursing notes.   HISTORY  Chief Complaint Left hand injury   History limited by: Not Limited   HPI Miguel [name] is a 56 y.o. male who presents to the emergency department today because of concerns for left hand injury. The patient states that he was cutting barbecue when the knife cut him on the back of his left hand. He denies any thoughts of wanting to hurt himself. The patient is well-known to this emergency department and was in fact discharged earlier today. Patient states last tetanus this year.    Past Medical History:  Diagnosis Date  . Asthma   . GERD (gastroesophageal reflux disease)   . Gout   . Hypertension   . OCD (obsessive compulsive disorder)   . Renal colic     Patient Active Problem List   Diagnosis Date Noted  . Moderate major depression, single episode ([place]) 08/15/2016  . Substance induced mood disorder ([place]) 08/15/2016  . Self-inflicted injury [id number]  . Involuntary commitment 08/15/2016  . Alcohol abuse 02/05/2016  . Hypertension 12/05/2015  . Tachycardia 12/05/2015  . Gout 11/13/2015  . Chronic back pain 05/02/2015    Past Surgical History:  Procedure Laterality Date  . CHOLECYSTECTOMY  2012    Prior to Admission medications   Medication Sig Start Date End Date Taking? Authorizing Provider  albuterol (VENTOLIN HFA) 108 (90 Base) MCG/ACT inhaler Inhale 2 puffs into the lungs every 4 (four) hours as needed for wheezing or shortness of breath.    Historical Provider, MD  allopurinol (ZYLOPRIM) 300 MG tablet Take 300 mg by mouth every morning.    Historical Provider, MD  chlordiazePOXIDE (LIBRIUM) 10 MG capsule Take 1 capsule (10 mg total) by mouth 3 (three) times daily as needed for anxiety or withdrawal. 06/30/16   Carrie Mew, MD  chlordiazePOXIDE (LIBRIUM) 25 MG capsule  Day 1 - 50 mg every 6 hours Day 2 - 25 mg every 6 hours Day 3 - 25 mg twice a day Day 4- 25 mg at night 11/16/16   Nance Pear, MD  colchicine 0.6 MG tablet 1 tablet once or twice a day with food 04/30/16 05/31/16  Larene Beach A McGowan, PA-C  cyclobenzaprine (FLEXERIL) 5 MG tablet Take 1 tablet (5 mg total) by mouth 3 (three) times daily as needed for muscle spasms. Patient not taking: Reported on 08/15/2016 07/28/16   Dannielle Karvonen Menshew, PA-C  dexlansoprazole (DEXILANT) 60 MG capsule Take 60 mg by mouth every morning.    Historical Provider, MD  etodolac (LODINE) 200 MG capsule Take 1 capsule (200 mg total) by mouth every 8 (eight) hours. Patient not taking: Reported on 08/15/2016 07/27/16   Loney Hering, MD  etodolac (LODINE) 200 MG capsule Take 1 capsule (200 mg total) by mouth every 8 (eight) hours. 11/19/16   Loney Hering, MD  FLUoxetine (PROZAC) 20 MG capsule Take 1 capsule (20 mg total) by mouth daily. 08/15/16   Gonzella Lex, MD  Fluticasone-Salmeterol (ADVAIR) 100-50 MCG/DOSE AEPB Inhale 1 puff into the lungs 2 (two) times daily.    Historical Provider, MD  HYDROcodone-acetaminophen (NORCO/VICODIN) 5-325 MG tablet Take 1-2 tablets by mouth every 4 (four) hours as needed for moderate pain. Patient not taking: Reported on 08/15/2016 05/02/16   Hinda Kehr, MD  ibuprofen (ADVIL,MOTRIN) 600 MG tablet Take 1 tablet (600 mg total) by mouth every  6 (six) hours as needed. Patient not taking: Reported on 08/15/2016 12/31/15   Sable Feil, PA-C  ketorolac (TORADOL) 10 MG tablet Take 1 tablet (10 mg total) by mouth every 8 (eight) hours. Patient not taking: Reported on 08/15/2016 07/28/16   Dannielle Karvonen Menshew, PA-C  lidocaine (LIDODERM) 5 % Place 1 patch onto the skin every 12 (twelve) hours. Remove & Discard patch within 12 hours or as directed by MD 07/22/16 07/22/17  Nance Pear, MD  lisinopril (PRINIVIL,ZESTRIL) 20 MG tablet Take 20 mg by mouth every morning.     Historical Provider, MD   LORazepam (ATIVAN) 1 MG tablet Take 1 tablet (1 mg total) by mouth 2 (two) times daily as needed for anxiety. Patient not taking: Reported on 04/30/2016 04/01/16   Daymon Larsen, MD  naproxen (EC NAPROSYN) 500 MG EC tablet Take 1 tablet (500 mg total) by mouth 2 (two) times daily with a meal. Patient not taking: Reported on 08/15/2016 02/13/16   Dannielle Karvonen Menshew, PA-C  ondansetron (ZOFRAN ODT) 4 MG disintegrating tablet Take 1 tablet (4 mg total) by mouth every 8 (eight) hours as needed for nausea or vomiting. 11/19/16   Loney Hering, MD  ondansetron (ZOFRAN) 4 MG tablet Take 1 tablet (4 mg total) by mouth every 8 (eight) hours as needed for nausea or vomiting. 11/16/16   Nance Pear, MD  oxyCODONE-acetaminophen (ROXICET) 5-325 MG tablet Take 1 tablet by mouth every 6 (six) hours as needed. Patient not taking: Reported on 08/15/2016 07/27/16   Loney Hering, MD  potassium chloride (K-DUR) 10 MEQ tablet Take 1 tablet (10 mEq total) by mouth daily. Patient not taking: Reported on 08/15/2016 04/30/16   Larene Beach A McGowan, PA-C  predniSONE (DELTASONE) 20 MG tablet Take 3 tablets (60 mg total) by mouth daily. Patient not taking: Reported on 08/15/2016 05/02/16   Hinda Kehr, MD  sulfamethoxazole-trimethoprim (BACTRIM DS,SEPTRA DS) 800-160 MG tablet Take 1 tablet by mouth 2 (two) times daily. 12/31/16   Laban Emperor, PA-C    Allergies Patient has no known allergies.  No family history on file.  Social History Social History  Substance Use Topics  . Smoking status: Former Research scientist (life sciences)  . Smokeless tobacco: Never Used  . Alcohol use Yes    Review of Systems  Constitutional: Negative for fever. Cardiovascular: Negative for chest pain. Respiratory: Negative for shortness of breath. Gastrointestinal: Negative for abdominal pain, vomiting and diarrhea. Genitourinary: Negative for dysuria. Musculoskeletal: Negative for back pain. Skin: Positive for laceration to back of left  hand. Neurological: Negative for headaches, focal weakness or numbness.  10-point ROS otherwise negative.  ____________________________________________   PHYSICAL EXAM:  VITAL SIGNS: ED Triage Vitals  Enc Vitals Group     BP 123/70     Pulse 98     Resp      Temp 99.3     Temp src      SpO2 96   Constitutional: Alert and oriented. Well appearing and in no distress. Eyes: Conjunctivae are normal. Normal extraocular movements. ENT   Head: Normocephalic and atraumatic.   Nose: No congestion/rhinnorhea.   Mouth/Throat: Mucous membranes are moist.   Neck: No stridor. Hematological/Lymphatic/Immunilogical: No cervical lymphadenopathy. Cardiovascular: Normal rate, regular rhythm.  No murmurs, rubs, or gallops.  Respiratory: Normal respiratory effort without tachypnea nor retractions. Breath sounds are clear and equal bilaterally. No wheezes/rales/rhonchi. Gastrointestinal: Soft and non tender. No rebound. No guarding.  Genitourinary: Deferred Musculoskeletal: Left hand with two small lacerations. Extension  of fingers of left arm without any deficit.  Neurologic:  Normal speech and language. No gross focal neurologic deficits are appreciated.  Skin:  Two small lacerations to dorsum of left hand. One 1.5 cm the other 0.5 cm.  Psychiatric: Mood and affect are normal. Speech and behavior are normal. Patient exhibits appropriate insight and judgment.  ____________________________________________    LABS (pertinent positives/negatives)  Labs Reviewed  COMPREHENSIVE METABOLIC PANEL - Abnormal; Notable for the following:       Result Value   Potassium 3.4 (*)    Glucose, Bld 111 (*)    Calcium 8.8 (*)    All other components within normal limits  ETHANOL - Abnormal; Notable for the following:    Alcohol, Ethyl (B) 362 (*)    All other components within normal limits  CBC WITH DIFFERENTIAL/PLATELET - Abnormal; Notable for the following:    RDW 14.8 (*)    All other  components within normal limits  ACETAMINOPHEN LEVEL - Abnormal; Notable for the following:    Acetaminophen (Tylenol), Serum <10 (*)    All other components within normal limits  SALICYLATE LEVEL  URINE DRUG SCREEN, QUALITATIVE ([facility] ONLY)     ____________________________________________   EKG  None  ____________________________________________    RADIOLOGY  Left hand   IMPRESSION:  Negative.     ____________________________________________   PROCEDURES  Procedures  LACERATION REPAIR Performed by: Nance Pear Authorized by: Nance Pear Consent: Verbal consent obtained. Risks and benefits: risks, benefits and alternatives were discussed Consent given by: patient Patient identity confirmed: provided demographic data Prepped and Draped in normal sterile fashion Wound explored  Laceration Location: dorsum of left hand  Laceration Length: 2 cm  No Foreign Bodies seen or palpated  Anesthesia: local infiltration  Local anesthetic: lidocaine 1%   Anesthetic total: 2 ml  Irrigation method: syringe Amount of cleaning: standard   Skin closure: For the 1.5 cm laceration 5-0 vicryl rapide For the 0.5 cm dermabond  Number of sutures: 2  Technique: simple interrupted   Patient tolerance: Patient tolerated the procedure well with no immediate complications.  ____________________________________________   INITIAL IMPRESSION / ASSESSMENT AND PLAN / ED COURSE  Pertinent labs & imaging results that were available during my care of the patient were reviewed by me and considered in my medical decision making (see chart for details).  Patient presented to the emergency department today because of lacerations to the back of his left hand that he acquired while cutting barbecue. Patient does have a history of psych issues so was evaluated by specialist on-call. At this point they do not feel the patient is any danger to himself. Patient is also a known  alcohol abuser and alcohol level is elevated. Patient's lacerations were closed. Patient denies any self-harm. Will discharge.  ____________________________________________   FINAL CLINICAL IMPRESSION(S) / ED DIAGNOSES  Final diagnoses:  Laceration of left hand, foreign body presence unspecified, initial encounter     Note: This dictation was prepared with Dragon dictation. Any transcriptional errors that result from this process are unintentional     Nance Pear, MD 01/01/17 2304

## 2017-01-01 NOTE — ED Notes (Signed)
CRITICAL LAB: ETOH is 362, [facility], Dr. Archie Balboa notified, orders received

## 2017-01-01 NOTE — ED Notes (Signed)
SOC with pt att 

## 2017-01-01 NOTE — ED Triage Notes (Signed)
Pt unable to answer questions for EDP or RN.  Spoke with pt's wife, Olin Hauser, per pt approval.  Wife states pt has been drinking tonight, but unknown as to how much.  Wife also states that EMS was called out for stomach pain, which patient was seen for yesterday afternoon.

## 2017-01-01 NOTE — ED Provider Notes (Signed)
[facility] Emergency Department Provider Note _   First MD Initiated Contact with Patient 01/01/17 [phone]     (approximate)  I have reviewed the triage vital signs and the nursing notes.   HISTORY  Chief Complaint Abdominal Pain and Alcohol Problem    HPI Miguel [name] is a 56 y.o. male presents via EMS stating that he does not know why his wife called the paramedics. Patient was seen earlier today for abdominal pain. Patient obviously intoxicated at this time. However he states that he has not drank since 7 PM tonight.   Past Medical History:  Diagnosis Date  . Asthma   . GERD (gastroesophageal reflux disease)   . Gout   . Hypertension   . OCD (obsessive compulsive disorder)   . Renal colic     Patient Active Problem List   Diagnosis Date Noted  . Moderate major depression, single episode ([place]) 08/15/2016  . Substance induced mood disorder ([place]) 08/15/2016  . Self-inflicted injury [id number]  . Involuntary commitment 08/15/2016  . Alcohol abuse 02/05/2016  . Hypertension 12/05/2015  . Tachycardia 12/05/2015  . Gout 11/13/2015  . Chronic back pain 05/02/2015    Past Surgical History:  Procedure Laterality Date  . CHOLECYSTECTOMY  2012    Prior to Admission medications   Medication Sig Start Date End Date Taking? Authorizing Provider  albuterol (VENTOLIN HFA) 108 (90 Base) MCG/ACT inhaler Inhale 2 puffs into the lungs every 4 (four) hours as needed for wheezing or shortness of breath.    Historical Provider, MD  allopurinol (ZYLOPRIM) 300 MG tablet Take 300 mg by mouth every morning.    Historical Provider, MD  chlordiazePOXIDE (LIBRIUM) 10 MG capsule Take 1 capsule (10 mg total) by mouth 3 (three) times daily as needed for anxiety or withdrawal. 06/30/16   Carrie Mew, MD  chlordiazePOXIDE (LIBRIUM) 25 MG capsule Day 1 - 50 mg every 6 hours Day 2 - 25 mg every 6 hours Day 3 - 25 mg twice a day Day 4- 25 mg at night 11/16/16   Nance Pear, MD  colchicine 0.6 MG tablet 1 tablet once or twice a day with food 04/30/16 05/31/16  Larene Beach A McGowan, PA-C  cyclobenzaprine (FLEXERIL) 5 MG tablet Take 1 tablet (5 mg total) by mouth 3 (three) times daily as needed for muscle spasms. Patient not taking: Reported on 08/15/2016 07/28/16   Dannielle Karvonen Menshew, PA-C  dexlansoprazole (DEXILANT) 60 MG capsule Take 60 mg by mouth every morning.    Historical Provider, MD  etodolac (LODINE) 200 MG capsule Take 1 capsule (200 mg total) by mouth every 8 (eight) hours. Patient not taking: Reported on 08/15/2016 07/27/16   Loney Hering, MD  etodolac (LODINE) 200 MG capsule Take 1 capsule (200 mg total) by mouth every 8 (eight) hours. 11/19/16   Loney Hering, MD  FLUoxetine (PROZAC) 20 MG capsule Take 1 capsule (20 mg total) by mouth daily. 08/15/16   Gonzella Lex, MD  Fluticasone-Salmeterol (ADVAIR) 100-50 MCG/DOSE AEPB Inhale 1 puff into the lungs 2 (two) times daily.    Historical Provider, MD  HYDROcodone-acetaminophen (NORCO/VICODIN) 5-325 MG tablet Take 1-2 tablets by mouth every 4 (four) hours as needed for moderate pain. Patient not taking: Reported on 08/15/2016 05/02/16   Hinda Kehr, MD  ibuprofen (ADVIL,MOTRIN) 600 MG tablet Take 1 tablet (600 mg total) by mouth every 6 (six) hours as needed. Patient not taking: Reported on 08/15/2016 12/31/15   Hinda Lenis  Smith, PA-C  ketorolac (TORADOL) 10 MG tablet Take 1 tablet (10 mg total) by mouth every 8 (eight) hours. Patient not taking: Reported on 08/15/2016 07/28/16   Dannielle Karvonen Menshew, PA-C  lidocaine (LIDODERM) 5 % Place 1 patch onto the skin every 12 (twelve) hours. Remove & Discard patch within 12 hours or as directed by MD 07/22/16 07/22/17  Nance Pear, MD  lisinopril (PRINIVIL,ZESTRIL) 20 MG tablet Take 20 mg by mouth every morning.     Historical Provider, MD  LORazepam (ATIVAN) 1 MG tablet Take 1 tablet (1 mg total) by mouth 2 (two) times daily as needed for anxiety. Patient  not taking: Reported on 04/30/2016 04/01/16   Daymon Larsen, MD  naproxen (EC NAPROSYN) 500 MG EC tablet Take 1 tablet (500 mg total) by mouth 2 (two) times daily with a meal. Patient not taking: Reported on 08/15/2016 02/13/16   Dannielle Karvonen Menshew, PA-C  ondansetron (ZOFRAN ODT) 4 MG disintegrating tablet Take 1 tablet (4 mg total) by mouth every 8 (eight) hours as needed for nausea or vomiting. 11/19/16   Loney Hering, MD  ondansetron (ZOFRAN) 4 MG tablet Take 1 tablet (4 mg total) by mouth every 8 (eight) hours as needed for nausea or vomiting. 11/16/16   Nance Pear, MD  oxyCODONE-acetaminophen (ROXICET) 5-325 MG tablet Take 1 tablet by mouth every 6 (six) hours as needed. Patient not taking: Reported on 08/15/2016 07/27/16   Loney Hering, MD  potassium chloride (K-DUR) 10 MEQ tablet Take 1 tablet (10 mEq total) by mouth daily. Patient not taking: Reported on 08/15/2016 04/30/16   Larene Beach A McGowan, PA-C  predniSONE (DELTASONE) 20 MG tablet Take 3 tablets (60 mg total) by mouth daily. Patient not taking: Reported on 08/15/2016 05/02/16   Hinda Kehr, MD  sulfamethoxazole-trimethoprim (BACTRIM DS,SEPTRA DS) 800-160 MG tablet Take 1 tablet by mouth 2 (two) times daily. 12/31/16   Laban Emperor, PA-C    Allergies Patient has no known allergies.  No family history on file.  Social History Social History  Substance Use Topics  . Smoking status: Former Research scientist (life sciences)  . Smokeless tobacco: Never Used  . Alcohol use Yes    Review of Systems Constitutional: No fever/chills Eyes: No visual changes. ENT: No sore throat. Cardiovascular: Denies chest pain. Respiratory: Denies shortness of breath. Gastrointestinal: No abdominal pain.  No nausea, no vomiting.  No diarrhea.  No constipation. Genitourinary: Negative for dysuria. Musculoskeletal: Negative for back pain. Skin: Negative for rash. Neurological: Negative for headaches, focal weakness or numbness. Psychiatric:Positive for  intoxication  10-point ROS otherwise negative.  ____________________________________________   PHYSICAL EXAM:  VITAL SIGNS: ED Triage Vitals  Enc Vitals Group     BP 01/01/17 0032 (!) 136/91     Pulse Rate 01/01/17 0032 (!) 105     Resp 01/01/17 0032 19     Temp 01/01/17 0032 98.8 F (37.1 C)     Temp Source 01/01/17 0032 Oral     SpO2 01/01/17 0032 100 %     Weight 01/01/17 0033 200 lb (90.7 kg)     Height 01/01/17 0033 6' (1.829 m)     Head Circumference --      Peak Flow --      Pain Score --      Pain Loc --      Pain Edu? --      Excl. in [place]? --     Constitutional: Alert and Appears intoxicated Eyes: Conjunctivae are normal. PERRL. EOMI.  Head: Atraumatic. Ears:  Healthy appearing ear canals and TMs bilaterally Nose: No congestion/rhinnorhea. Mouth/Throat: Mucous membranes are moist.Oropharynx non-erythematous. Neck: No stridor. No cervical spine tenderness to palpation. Cardiovascular: Normal rate, regular rhythm. Good peripheral circulation. Grossly normal heart sounds. Respiratory: Normal respiratory effort.  No retractions. Lungs CTAB. Gastrointestinal: Soft and nontender. No distention.  Musculoskeletal: No lower extremity tenderness nor edema. No gross deformities of extremities. Neurologic:  Normal speech and language. No gross focal neurologic deficits are appreciated.  Skin:  Skin is warm, dry and intact. No rash noted. Psychiatric: Agitated and verbally abusive to staff ____________________________________________   LABS (all labs ordered are listed, but only abnormal results are displayed)  Labs Reviewed  COMPREHENSIVE METABOLIC PANEL - Abnormal; Notable for the following:       Result Value   Glucose, Bld 120 (*)    Calcium 8.8 (*)    All other components within normal limits  ETHANOL - Abnormal; Notable for the following:    Alcohol, Ethyl (B) 331 (*)    All other components within normal limits  CBC - Abnormal; Notable for the following:     RDW 14.7 (*)    All other components within normal limits  LIPASE, BLOOD  URINE DRUG SCREEN, QUALITATIVE ([facility] ONLY)     Procedures     INITIAL IMPRESSION / ASSESSMENT AND PLAN / ED COURSE  Pertinent labs & imaging results that were available during my care of the patient were reviewed by me and considered in my medical decision making (see chart for details).  Patient's wife was notified who stated that the patient asked her to call paramedics secondary to abdominal discomfort which the patient is not complaining of at present. Patient noted to be intoxicated with EtOH level of 331      ____________________________________________  FINAL CLINICAL IMPRESSION(S) / ED DIAGNOSES  Final diagnoses:  Alcoholic intoxication with complication ([place])     MEDICATIONS GIVEN DURING THIS VISIT:  Medications  LORazepam (ATIVAN) injection 2 mg (not administered)  haloperidol lactate (HALDOL) injection 5 mg (not administered)  diphenhydrAMINE (BENADRYL) injection 50 mg (not administered)     NEW OUTPATIENT MEDICATIONS STARTED DURING THIS VISIT:  New Prescriptions   No medications on file    Modified Medications   No medications on file    Discontinued Medications   No medications on file     Note:  This document was prepared using Dragon voice recognition software and may include unintentional dictation errors.    Gregor Hams, MD 01/01/17 [phone]

## 2017-01-01 NOTE — ED Notes (Signed)
Called pt's wife per request, [phone], Ms Leta Baptist reports she believes pt's injury to be accidental and confirms pt was chopping BBQ at time of injury, reports that pt doesn't drink every day but believes that pt drinks ETOH to alleviate chronic back pain, Dr Archie Balboa notified

## 2017-01-01 NOTE — ED Triage Notes (Signed)
Pt ems from home. encoded as suicide attempt however pt denies si/hi. Pt does have a lac to left hand that happened while cutting meat. Pt is confused, anxious and smells of etoh.

## 2017-01-01 NOTE — ED Notes (Signed)
Pt dressed out by Derrel Nip, NT.  Blue shirt, red pants, blue shoes, yellow ring, black phone, black wallet secured.

## 2017-01-01 NOTE — ED Notes (Signed)
EDP notified of etoh level.  Acknowledged.

## 2017-01-01 NOTE — ED Notes (Signed)
Pt awake, alert, able to dress self. Shaky, with nausea. MD notified and zofran given. Pt discharged to lobby where he will call for and pay for taxi. He verbalized understanding of this. He also is aware that if he is unable to get a taxi that the bus stop is across the parking lot. Pt stated that he has taken the bus on a prior admission.

## 2017-01-01 NOTE — Discharge Instructions (Signed)
Your stitches (sutures) will go away on their own. Please seek medical attention for any high fevers, chest pain, shortness of breath, change in behavior, persistent vomiting, bloody stool or any other new or concerning symptoms.

## 2017-01-02 NOTE — ED Notes (Addendum)
Taxi called for pt, no wallet and no money at home, msg left no answers at [employer]

## 2017-01-02 NOTE — ED Notes (Addendum)
Taxi called for pt, msg left no answer at [employer],  [employer] contacted for [place] wellness check  Discharge instructions reviewed with patient. Questions fielded by this RN. Patient verbalizes understanding of instructions. Patient discharged home in stable condition per Dr Karma Greaser. No acute distress noted at time of discharge.   Pt A&O, ambulatory to toilet and in hallway without difficulty, pt appears at baseline and safe to DC to home via taxi, pt repeatedly expressing concern to get home to wife  Pt given AA and community resources via several handout with numbers to call

## 2017-01-02 NOTE — ED Notes (Addendum)
Taxi called for pt msg left no answer at [employer] given for DC

## 2017-01-07 ENCOUNTER — Emergency Department
Admission: EM | Admit: 2017-01-07 | Discharge: 2017-01-07 | Disposition: A | Discharge status: Home / Self Care | Payer: Self-pay | Attending: Emergency Medicine | Admitting: Emergency Medicine

## 2017-01-07 ENCOUNTER — Emergency Department: Payer: Self-pay

## 2017-01-07 ENCOUNTER — Encounter: Payer: Self-pay | Admitting: Emergency Medicine

## 2017-01-07 DIAGNOSIS — Z87891 Personal history of nicotine dependence: Secondary | ICD-10-CM | POA: Insufficient documentation

## 2017-01-07 DIAGNOSIS — F1010 Alcohol abuse, uncomplicated: Secondary | ICD-10-CM | POA: Insufficient documentation

## 2017-01-07 DIAGNOSIS — Z79899 Other long term (current) drug therapy: Secondary | ICD-10-CM | POA: Insufficient documentation

## 2017-01-07 DIAGNOSIS — J45909 Unspecified asthma, uncomplicated: Secondary | ICD-10-CM | POA: Insufficient documentation

## 2017-01-07 DIAGNOSIS — I10 Essential (primary) hypertension: Secondary | ICD-10-CM | POA: Insufficient documentation

## 2017-01-07 LAB — COMPREHENSIVE METABOLIC PANEL
ALT: 19 U/L (ref 17–63)
AST: 30 U/L (ref 15–41)
Albumin: 3.7 g/dL (ref 3.5–5.0)
Alkaline Phosphatase: 79 U/L (ref 38–126)
Anion gap: 13 (ref 5–15)
BUN: 10 mg/dL (ref 6–20)
CO2: 24 mmol/L (ref 22–32)
Calcium: 8.7 mg/dL — ABNORMAL LOW (ref 8.9–10.3)
Chloride: 106 mmol/L (ref 101–111)
Creatinine, Ser: 0.92 mg/dL (ref 0.61–1.24)
GFR calc Af Amer: >60 mL/min (ref >60)
GFR calc non Af Amer: >60 mL/min (ref >60)
Glucose, Bld: 206 mg/dL — ABNORMAL HIGH (ref 65–99)
Potassium: 3.2 mmol/L — ABNORMAL LOW (ref 3.5–5.1)
Sodium: 143 mmol/L (ref 135–145)
Total Bilirubin: 0.6 mg/dL (ref 0.3–1.2)
Total Protein: 6.4 g/dL — ABNORMAL LOW (ref 6.5–8.1)

## 2017-01-07 LAB — CBC
HCT: 43.1 % (ref 40.0–52.0)
Hemoglobin: 14.4 g/dL (ref 13.0–18.0)
MCH: 31.4 pg (ref 26.0–34.0)
MCHC: 33.4 g/dL (ref 32.0–36.0)
MCV: 94 fL (ref 80.0–100.0)
Platelets: 173 10*3/uL (ref 150–440)
RBC: 4.59 MIL/uL (ref 4.40–5.90)
RDW: 14.9 % — ABNORMAL HIGH (ref 11.5–14.5)
WBC: 5.4 10*3/uL (ref 3.8–10.6)

## 2017-01-07 LAB — ETHANOL: Alcohol, Ethyl (B): 337 mg/dL (ref <5)

## 2017-01-07 LAB — TROPONIN I: Troponin I: <0.03 ng/mL (ref <0.03)

## 2017-01-07 MED ORDER — ONDANSETRON 4 MG PO TBDP
4.0000 mg | ORAL_TABLET | Freq: Once | ORAL | Status: AC
  Administered 2017-01-07: 4 mg via ORAL

## 2017-01-07 MED ORDER — ONDANSETRON 4 MG PO TBDP
ORAL_TABLET | ORAL | Status: AC
  Administered 2017-01-07: 4 mg via ORAL
  Filled 2017-01-07: qty 1

## 2017-01-07 NOTE — ED Notes (Signed)
Patient given food and drink tray, encouraged to eat and drink, patient ambulating to toilet.

## 2017-01-07 NOTE — ED Triage Notes (Signed)
Pt to ED via EMS from home c/o feeling anxious, pacing, and hyperventilating.  EMS states patient hitting himself during EMS ride and was pacing at home.  Patient reports drinking 16oz wine tonight.  Pt denies SI/HI.

## 2017-01-07 NOTE — ED Notes (Signed)
Patient transported to X-ray 

## 2017-01-07 NOTE — ED Notes (Signed)

## 2017-01-07 NOTE — ED Provider Notes (Signed)
[facility] Emergency Department Provider Note   ____________________________________________    I have reviewed the triage vital signs and the nursing notes.   HISTORY  Chief Complaint Anxiety    HPI Miguel [name] is a 56 y.o. male well-known to our department who presents with complaints of anxiety. He admits to drinking 16 ounces of wine. He states he feels anxious and uncomfortable and wanted to be "checked out". He denies nausea or vomiting. Denies chest pain but does report that he felt it was difficult to breathe earlier although this has resolved now. No fevers or chills reported   Past Medical History:  Diagnosis Date  . Asthma   . GERD (gastroesophageal reflux disease)   . Gout   . Hypertension   . OCD (obsessive compulsive disorder)   . Renal colic     Patient Active Problem List   Diagnosis Date Noted  . Moderate major depression, single episode ([place]) 08/15/2016  . Substance induced mood disorder ([place]) 08/15/2016  . Self-inflicted injury [id number]  . Involuntary commitment 08/15/2016  . Alcohol abuse 02/05/2016  . Hypertension 12/05/2015  . Tachycardia 12/05/2015  . Gout 11/13/2015  . Chronic back pain 05/02/2015    Past Surgical History:  Procedure Laterality Date  . CHOLECYSTECTOMY  2012    Prior to Admission medications   Medication Sig Start Date End Date Taking? Authorizing Provider  albuterol (VENTOLIN HFA) 108 (90 Base) MCG/ACT inhaler Inhale 2 puffs into the lungs every 4 (four) hours as needed for wheezing or shortness of breath.    Historical Provider, MD  allopurinol (ZYLOPRIM) 300 MG tablet Take 300 mg by mouth every morning.    Historical Provider, MD  chlordiazePOXIDE (LIBRIUM) 10 MG capsule Take 1 capsule (10 mg total) by mouth 3 (three) times daily as needed for anxiety or withdrawal. 06/30/16   Carrie Mew, MD  chlordiazePOXIDE (LIBRIUM) 25 MG capsule Day 1 - 50 mg every 6 hours Day 2 - 25 mg every 6  hours Day 3 - 25 mg twice a day Day 4- 25 mg at night 11/16/16   Nance Pear, MD  colchicine 0.6 MG tablet 1 tablet once or twice a day with food 04/30/16 05/31/16  Larene Beach A McGowan, PA-C  cyclobenzaprine (FLEXERIL) 5 MG tablet Take 1 tablet (5 mg total) by mouth 3 (three) times daily as needed for muscle spasms. Patient not taking: Reported on 08/15/2016 07/28/16   Dannielle Karvonen Menshew, PA-C  dexlansoprazole (DEXILANT) 60 MG capsule Take 60 mg by mouth every morning.    Historical Provider, MD  etodolac (LODINE) 200 MG capsule Take 1 capsule (200 mg total) by mouth every 8 (eight) hours. Patient not taking: Reported on 08/15/2016 07/27/16   Loney Hering, MD  etodolac (LODINE) 200 MG capsule Take 1 capsule (200 mg total) by mouth every 8 (eight) hours. 11/19/16   Loney Hering, MD  FLUoxetine (PROZAC) 20 MG capsule Take 1 capsule (20 mg total) by mouth daily. 08/15/16   Gonzella Lex, MD  Fluticasone-Salmeterol (ADVAIR) 100-50 MCG/DOSE AEPB Inhale 1 puff into the lungs 2 (two) times daily.    Historical Provider, MD  HYDROcodone-acetaminophen (NORCO/VICODIN) 5-325 MG tablet Take 1-2 tablets by mouth every 4 (four) hours as needed for moderate pain. Patient not taking: Reported on 08/15/2016 05/02/16   Hinda Kehr, MD  ibuprofen (ADVIL,MOTRIN) 600 MG tablet Take 1 tablet (600 mg total) by mouth every 6 (six) hours as needed. Patient not taking: Reported on  08/15/2016 12/31/15   Sable Feil, PA-C  ketorolac (TORADOL) 10 MG tablet Take 1 tablet (10 mg total) by mouth every 8 (eight) hours. Patient not taking: Reported on 08/15/2016 07/28/16   Dannielle Karvonen Menshew, PA-C  lidocaine (LIDODERM) 5 % Place 1 patch onto the skin every 12 (twelve) hours. Remove & Discard patch within 12 hours or as directed by MD 07/22/16 07/22/17  Nance Pear, MD  lisinopril (PRINIVIL,ZESTRIL) 20 MG tablet Take 20 mg by mouth every morning.     Historical Provider, MD  LORazepam (ATIVAN) 1 MG tablet Take 1 tablet (1  mg total) by mouth 2 (two) times daily as needed for anxiety. Patient not taking: Reported on 04/30/2016 04/01/16   Daymon Larsen, MD  naproxen (EC NAPROSYN) 500 MG EC tablet Take 1 tablet (500 mg total) by mouth 2 (two) times daily with a meal. Patient not taking: Reported on 08/15/2016 02/13/16   Dannielle Karvonen Menshew, PA-C  ondansetron (ZOFRAN ODT) 4 MG disintegrating tablet Take 1 tablet (4 mg total) by mouth every 8 (eight) hours as needed for nausea or vomiting. 11/19/16   Loney Hering, MD  ondansetron (ZOFRAN) 4 MG tablet Take 1 tablet (4 mg total) by mouth every 8 (eight) hours as needed for nausea or vomiting. 11/16/16   Nance Pear, MD  oxyCODONE-acetaminophen (ROXICET) 5-325 MG tablet Take 1 tablet by mouth every 6 (six) hours as needed. Patient not taking: Reported on 08/15/2016 07/27/16   Loney Hering, MD  potassium chloride (K-DUR) 10 MEQ tablet Take 1 tablet (10 mEq total) by mouth daily. Patient not taking: Reported on 08/15/2016 04/30/16   Larene Beach A McGowan, PA-C  predniSONE (DELTASONE) 20 MG tablet Take 3 tablets (60 mg total) by mouth daily. Patient not taking: Reported on 08/15/2016 05/02/16   Hinda Kehr, MD  sulfamethoxazole-trimethoprim (BACTRIM DS,SEPTRA DS) 800-160 MG tablet Take 1 tablet by mouth 2 (two) times daily. 12/31/16   Laban Emperor, PA-C     Allergies Patient has no known allergies.  History reviewed. No pertinent family history.  Social History Social History  Substance Use Topics  . Smoking status: Former Research scientist (life sciences)  . Smokeless tobacco: Never Used  . Alcohol use Yes    Review of Systems  Constitutional: No fever/chills Eyes: No visual changes.   Cardiovascular: Denies chest pain. Respiratory:No cough Gastrointestinal: No abdominal pain.  No nausea, no vomiting.    Musculoskeletal: Chronic back pain  Neurological: Negative for headaches   10-point ROS otherwise negative.  ____________________________________________   PHYSICAL  EXAM:  VITAL SIGNS: ED Triage Vitals  Enc Vitals Group     BP 01/07/17 1914 (!) 152/109     Pulse Rate 01/07/17 1914 (!) 118     Resp 01/07/17 1914 20     Temp 01/07/17 1914 98.3 F (36.8 C)     Temp Source 01/07/17 1914 Oral     SpO2 01/07/17 1914 97 %     Weight 01/07/17 1915 200 lb (90.7 kg)     Height 01/07/17 1915 6' (1.829 m)     Head Circumference --      Peak Flow --      Pain Score 01/07/17 1933 0     Pain Loc --      Pain Edu? --      Excl. in [place]? --     Constitutional: Alert and oriented. No acute distress. Anxious Eyes: Conjunctivae are normal.  Head: Atraumatic. Nose: No congestion/rhinnorhea. Mouth/Throat: Mucous membranes are moist.  Neck:  Painless ROM Cardiovascular: Tachycardia, regular rhythm. Grossly normal heart sounds.  Good peripheral circulation. Respiratory: Normal respiratory effort.  No retractions. Lungs CTAB. Gastrointestinal: Soft and nontender. No distention.  No CVA tenderness. Genitourinary: deferred Musculoskeletal: No lower extremity tenderness nor edema.  Warm and well perfused Neurologic:  Normal speech and language. No gross focal neurologic deficits are appreciated.  Skin:  Skin is warm, dry and intact. No rash noted.   ____________________________________________   LABS (all labs ordered are listed, but only abnormal results are displayed)  Labs Reviewed  CBC - Abnormal; Notable for the following:       Result Value   RDW 14.9 (*)    All other components within normal limits  COMPREHENSIVE METABOLIC PANEL - Abnormal; Notable for the following:    Potassium 3.2 (*)    Glucose, Bld 206 (*)    Calcium 8.7 (*)    Total Protein 6.4 (*)    All other components within normal limits  ETHANOL - Abnormal; Notable for the following:    Alcohol, Ethyl (B) 337 (*)    All other components within normal limits  TROPONIN I   ____________________________________________  EKG  ED ECG REPORT I, Lavonia Drafts, the attending  physician, personally viewed and interpreted this ECG.  Date: 01/07/2017 EKG Time: 7:51 PM Rate: 109 Rhythm: Sinus tachycardia QRS Axis: normal Intervals: normal ST/T Wave abnormalities: normal Conduction Disturbances: none   ____________________________________________  RADIOLOGY  Chest x-ray unremarkable ____________________________________________   PROCEDURES  Procedure(s) performed: No    Critical Care performed: No ____________________________________________   INITIAL IMPRESSION / ASSESSMENT AND PLAN / ED COURSE  Pertinent labs & imaging results that were available during my care of the patient were reviewed by me and considered in my medical decision making (see chart for details).  Patient presents with primary complaint of anxiety but apparently also complained of shortness of breath prior to arrival. Seems well now some mild tachycardia. We will check labs, chest x-ray, EKG    _Patient's alcohol is elevated, otherwise lab work is unremarkable. He does not have a ride home so we will need to wait until he is clinically sober prior to discharge ___________________________________________   FINAL CLINICAL IMPRESSION(S) / ED DIAGNOSES  Final diagnoses:  Alcohol abuse      NEW MEDICATIONS STARTED DURING THIS VISIT:  New Prescriptions   No medications on file     Note:  This document was prepared using Dragon voice recognition software and may include unintentional dictation errors.    Lavonia Drafts, MD 01/07/17 2146

## 2017-01-19 ENCOUNTER — Emergency Department
Admission: EM | Admit: 2017-01-19 | Discharge: 2017-01-19 | Disposition: A | Discharge status: Home / Self Care | Payer: Self-pay | Attending: Emergency Medicine | Admitting: Emergency Medicine

## 2017-01-19 DIAGNOSIS — Z5321 Procedure and treatment not carried out due to patient leaving prior to being seen by health care provider: Secondary | ICD-10-CM | POA: Insufficient documentation

## 2017-01-19 DIAGNOSIS — Y929 Unspecified place or not applicable: Secondary | ICD-10-CM | POA: Insufficient documentation

## 2017-01-19 DIAGNOSIS — M545 Low back pain: Secondary | ICD-10-CM | POA: Insufficient documentation

## 2017-01-19 DIAGNOSIS — Y999 Unspecified external cause status: Secondary | ICD-10-CM | POA: Insufficient documentation

## 2017-01-19 DIAGNOSIS — Z87891 Personal history of nicotine dependence: Secondary | ICD-10-CM | POA: Insufficient documentation

## 2017-01-19 DIAGNOSIS — W19XXXA Unspecified fall, initial encounter: Secondary | ICD-10-CM | POA: Insufficient documentation

## 2017-01-19 DIAGNOSIS — J45909 Unspecified asthma, uncomplicated: Secondary | ICD-10-CM | POA: Insufficient documentation

## 2017-01-19 DIAGNOSIS — Y9389 Activity, other specified: Secondary | ICD-10-CM | POA: Insufficient documentation

## 2017-01-19 DIAGNOSIS — I10 Essential (primary) hypertension: Secondary | ICD-10-CM | POA: Insufficient documentation

## 2017-01-19 NOTE — ED Triage Notes (Addendum)
Pt siting calmly in waiting room when called. Pt reports to ED w/ c/o mechanical fall that occurred today while taking out trash.  Pt anxious in triage, rubbing self and hunched over.  Pt sts that he when he fell he landed on back. Denies LOC, CP, SOB or fevers.  Pt does not believe he hit head, no pain in that area. No obvious injury noted. Ambulatory to triage w/o issue.  Denies drug or ETOH use today

## 2017-01-19 NOTE — ED Notes (Signed)
This nurse and RN Lorriane Shire went into rm to assess pt. Pt states "I have to go home, my wife is handicapped, I can not stay." Pt ambulatory with steady gait to waiting rm, accompanied by RN Lorriane Shire. AMA formed signed. MD made aware.

## 2017-01-26 ENCOUNTER — Emergency Department
Admission: EM | Admit: 2017-01-26 | Discharge: 2017-01-26 | Disposition: A | Discharge status: Home / Self Care | Payer: Self-pay | Attending: Emergency Medicine | Admitting: Emergency Medicine

## 2017-01-26 ENCOUNTER — Emergency Department: Payer: Self-pay

## 2017-01-26 ENCOUNTER — Encounter: Payer: Self-pay | Admitting: Emergency Medicine

## 2017-01-26 DIAGNOSIS — R251 Tremor, unspecified: Secondary | ICD-10-CM | POA: Insufficient documentation

## 2017-01-26 DIAGNOSIS — I10 Essential (primary) hypertension: Secondary | ICD-10-CM | POA: Insufficient documentation

## 2017-01-26 DIAGNOSIS — J45909 Unspecified asthma, uncomplicated: Secondary | ICD-10-CM | POA: Insufficient documentation

## 2017-01-26 DIAGNOSIS — F10239 Alcohol dependence with withdrawal, unspecified: Secondary | ICD-10-CM | POA: Insufficient documentation

## 2017-01-26 DIAGNOSIS — M542 Cervicalgia: Secondary | ICD-10-CM | POA: Insufficient documentation

## 2017-01-26 DIAGNOSIS — Z79899 Other long term (current) drug therapy: Secondary | ICD-10-CM | POA: Insufficient documentation

## 2017-01-26 DIAGNOSIS — F10939 Alcohol use, unspecified with withdrawal, unspecified: Secondary | ICD-10-CM

## 2017-01-26 DIAGNOSIS — Z87891 Personal history of nicotine dependence: Secondary | ICD-10-CM | POA: Insufficient documentation

## 2017-01-26 LAB — CBC WITH DIFFERENTIAL/PLATELET
Band Neutrophils: 0 %
Basophils Absolute: 0 10*3/uL (ref 0–0.1)
Basophils Relative: 0 %
Blasts: 0 %
Eosinophils Absolute: 0 10*3/uL (ref 0–0.7)
Eosinophils Relative: 0 %
HCT: 42.8 % (ref 40.0–52.0)
Hemoglobin: 14.3 g/dL (ref 13.0–18.0)
Lymphocytes Relative: 36 %
Lymphs Abs: 1.7 10*3/uL (ref 1.0–3.6)
MCH: 31.5 pg (ref 26.0–34.0)
MCHC: 33.4 g/dL (ref 32.0–36.0)
MCV: 94.5 fL (ref 80.0–100.0)
Metamyelocytes Relative: 0 %
Monocytes Absolute: 0.2 10*3/uL (ref 0.2–1.0)
Monocytes Relative: 5 %
Myelocytes: 0 %
Neutro Abs: 2.8 10*3/uL (ref 1.4–6.5)
Neutrophils Relative %: 59 %
Other: 0 %
Platelets: 156 10*3/uL (ref 150–440)
Promyelocytes Absolute: 0 %
RBC: 4.53 MIL/uL (ref 4.40–5.90)
RDW: 14.9 % — ABNORMAL HIGH (ref 11.5–14.5)
WBC: 4.7 10*3/uL (ref 3.8–10.6)
nRBC: 0 /100 WBC

## 2017-01-26 LAB — COMPREHENSIVE METABOLIC PANEL
ALT: 20 U/L (ref 17–63)
AST: 24 U/L (ref 15–41)
Albumin: 3.5 g/dL (ref 3.5–5.0)
Alkaline Phosphatase: 83 U/L (ref 38–126)
Anion gap: 10 (ref 5–15)
BUN: 7 mg/dL (ref 6–20)
CO2: 27 mmol/L (ref 22–32)
Calcium: 8.5 mg/dL — ABNORMAL LOW (ref 8.9–10.3)
Chloride: 104 mmol/L (ref 101–111)
Creatinine, Ser: 1.02 mg/dL (ref 0.61–1.24)
GFR calc Af Amer: >60 mL/min (ref >60)
GFR calc non Af Amer: >60 mL/min (ref >60)
Glucose, Bld: 124 mg/dL — ABNORMAL HIGH (ref 65–99)
Potassium: 3.5 mmol/L (ref 3.5–5.1)
Sodium: 141 mmol/L (ref 135–145)
Total Bilirubin: 1.1 mg/dL (ref 0.3–1.2)
Total Protein: 6.1 g/dL — ABNORMAL LOW (ref 6.5–8.1)

## 2017-01-26 LAB — URINE DRUG SCREEN, QUALITATIVE (ARMC ONLY)
Amphetamines, Ur Screen: NOT DETECTED
Barbiturates, Ur Screen: NOT DETECTED
Benzodiazepine, Ur Scrn: POSITIVE — AB
Cannabinoid 50 Ng, Ur ~~LOC~~: POSITIVE — AB
Cocaine Metabolite,Ur ~~LOC~~: NOT DETECTED
MDMA (Ecstasy)Ur Screen: NOT DETECTED
Methadone Scn, Ur: NOT DETECTED
Opiate, Ur Screen: POSITIVE — AB
Phencyclidine (PCP) Ur S: NOT DETECTED
Tricyclic, Ur Screen: NOT DETECTED

## 2017-01-26 LAB — TROPONIN I: Troponin I: <0.03 ng/mL (ref <0.03)

## 2017-01-26 LAB — ETHANOL: Alcohol, Ethyl (B): 127 mg/dL — ABNORMAL HIGH (ref <5)

## 2017-01-26 MED ORDER — ONDANSETRON HCL 4 MG/2ML IJ SOLN
4.0000 mg | Freq: Once | INTRAMUSCULAR | Status: AC
  Administered 2017-01-26: 4 mg via INTRAVENOUS
  Filled 2017-01-26: qty 2

## 2017-01-26 MED ORDER — CHLORDIAZEPOXIDE HCL 25 MG PO CAPS: ORAL_CAPSULE | ORAL | 0 refills | Status: DC

## 2017-01-26 MED ORDER — ONDANSETRON HCL 4 MG PO TABS: 4.0000 mg | ORAL_TABLET | Freq: Three times a day (TID) | ORAL | 0 refills | Status: DC | PRN

## 2017-01-26 MED ORDER — LORAZEPAM 2 MG/ML IJ SOLN
1.0000 mg | Freq: Once | INTRAMUSCULAR | Status: AC
  Administered 2017-01-26: 1 mg via INTRAVENOUS
  Filled 2017-01-26: qty 1

## 2017-01-26 MED ORDER — CHLORDIAZEPOXIDE HCL 25 MG PO CAPS
50.0000 mg | ORAL_CAPSULE | Freq: Once | ORAL | Status: AC
  Administered 2017-01-26: 50 mg via ORAL
  Filled 2017-01-26: qty 2

## 2017-01-26 NOTE — ED Notes (Signed)
Spoke to Dr. Clearnce Hasten in regards to patients presentation. Per [place] no protocols at this time. Patient is to be worked up medical first.

## 2017-01-26 NOTE — ED Notes (Signed)
Per Dr Alfred Levins pt is to stay in the ER room until a sober ride comes to pick him up and at that point he can be discharged

## 2017-01-26 NOTE — ED Provider Notes (Signed)
[facility] Emergency Department Provider Note  ____________________________________________  Time seen: Approximately 8:17 PM  I have reviewed the triage vital signs and the nursing notes.   HISTORY  Chief Complaint Alcohol Withdrawal and Back Pain   HPI Miguel [name] is a 56 y.o. male the history of alcohol abuse, OCD, hypertension, and gout who presents for evaluation of alcohol withdrawal. Patient reports that he drinks a pint of vodka a day for more than a year. He last stoppedl last year and was sober for various months. At that time had no complicated withdraws and was able to stop on his own with a Librium taper. He reports that he has been under a lot of stress and has a sick wife at home therefore he has been drinking a lot. He decided 48 hours ago to stop cold [place]. Today he was feeling anxious, diaphoretic, nauseous and started having tremors. He went to a pharmacy to ask for help and he was told that he could not stop cold [place] that he had to decrease the amount of alcohol on a daily basis. Patient then went home and took 2 shots of vodka with no improvement of his symptoms which prompted his visit to the emergency room. He is also complaining of neck pain. He is not sure if he fell while he was drunk. The pain is soreness in quality located in the base of his neck, constant for a few days. No paresthesias.He denies chest pain, shortness of breath, diarrhea, vomiting, headache, fever, chills. He denies drug use.  Past Medical History:  Diagnosis Date  . Asthma   . GERD (gastroesophageal reflux disease)   . Gout   . Hypertension   . OCD (obsessive compulsive disorder)   . Renal colic     Patient Active Problem List   Diagnosis Date Noted  . Moderate major depression, single episode ([place]) 08/15/2016  . Substance induced mood disorder ([place]) 08/15/2016  . Self-inflicted injury 85/12/7739  . Involuntary commitment 08/15/2016  . Alcohol abuse  02/05/2016  . Hypertension 12/05/2015  . Tachycardia 12/05/2015  . Gout 11/13/2015  . Chronic back pain 05/02/2015    Past Surgical History:  Procedure Laterality Date  . CHOLECYSTECTOMY  2012    Prior to Admission medications   Medication Sig Start Date End Date Taking? Authorizing Provider  albuterol (VENTOLIN HFA) 108 (90 Base) MCG/ACT inhaler Inhale 2 puffs into the lungs every 4 (four) hours as needed for wheezing or shortness of breath.    Historical Provider, MD  allopurinol (ZYLOPRIM) 300 MG tablet Take 300 mg by mouth every morning.    Historical Provider, MD  chlordiazePOXIDE (LIBRIUM) 25 MG capsule 50 mg 3 times a day x 3 days 50 mg 2 times a day for 3 days 50 mg once a day for 3 days stop 01/26/17   Rudene Re, MD  colchicine 0.6 MG tablet 1 tablet once or twice a day with food 04/30/16 05/31/16  Larene Beach A McGowan, PA-C  cyclobenzaprine (FLEXERIL) 5 MG tablet Take 1 tablet (5 mg total) by mouth 3 (three) times daily as needed for muscle spasms. Patient not taking: Reported on 08/15/2016 07/28/16   Dannielle Karvonen Menshew, PA-C  dexlansoprazole (DEXILANT) 60 MG capsule Take 60 mg by mouth every morning.    Historical Provider, MD  etodolac (LODINE) 200 MG capsule Take 1 capsule (200 mg total) by mouth every 8 (eight) hours. Patient not taking: Reported on 08/15/2016 07/27/16   Loney Hering,  MD  etodolac (LODINE) 200 MG capsule Take 1 capsule (200 mg total) by mouth every 8 (eight) hours. 11/19/16   Loney Hering, MD  FLUoxetine (PROZAC) 20 MG capsule Take 1 capsule (20 mg total) by mouth daily. 08/15/16   Gonzella Lex, MD  Fluticasone-Salmeterol (ADVAIR) 100-50 MCG/DOSE AEPB Inhale 1 puff into the lungs 2 (two) times daily.    Historical Provider, MD  HYDROcodone-acetaminophen (NORCO/VICODIN) 5-325 MG tablet Take 1-2 tablets by mouth every 4 (four) hours as needed for moderate pain. Patient not taking: Reported on 08/15/2016 05/02/16   Hinda Kehr, MD  ibuprofen  (ADVIL,MOTRIN) 600 MG tablet Take 1 tablet (600 mg total) by mouth every 6 (six) hours as needed. Patient not taking: Reported on 08/15/2016 12/31/15   Sable Feil, PA-C  ketorolac (TORADOL) 10 MG tablet Take 1 tablet (10 mg total) by mouth every 8 (eight) hours. Patient not taking: Reported on 08/15/2016 07/28/16   Dannielle Karvonen Menshew, PA-C  lidocaine (LIDODERM) 5 % Place 1 patch onto the skin every 12 (twelve) hours. Remove & Discard patch within 12 hours or as directed by MD 07/22/16 07/22/17  Nance Pear, MD  lisinopril (PRINIVIL,ZESTRIL) 20 MG tablet Take 20 mg by mouth every morning.     Historical Provider, MD  LORazepam (ATIVAN) 1 MG tablet Take 1 tablet (1 mg total) by mouth 2 (two) times daily as needed for anxiety. Patient not taking: Reported on 04/30/2016 04/01/16   Daymon Larsen, MD  naproxen (EC NAPROSYN) 500 MG EC tablet Take 1 tablet (500 mg total) by mouth 2 (two) times daily with a meal. Patient not taking: Reported on 08/15/2016 02/13/16   Dannielle Karvonen Menshew, PA-C  ondansetron (ZOFRAN ODT) 4 MG disintegrating tablet Take 1 tablet (4 mg total) by mouth every 8 (eight) hours as needed for nausea or vomiting. 11/19/16   Loney Hering, MD  ondansetron (ZOFRAN) 4 MG tablet Take 1 tablet (4 mg total) by mouth every 8 (eight) hours as needed for nausea or vomiting. 01/26/17   Rudene Re, MD  oxyCODONE-acetaminophen (ROXICET) 5-325 MG tablet Take 1 tablet by mouth every 6 (six) hours as needed. Patient not taking: Reported on 08/15/2016 07/27/16   Loney Hering, MD  potassium chloride (K-DUR) 10 MEQ tablet Take 1 tablet (10 mEq total) by mouth daily. Patient not taking: Reported on 08/15/2016 04/30/16   Larene Beach A McGowan, PA-C  predniSONE (DELTASONE) 20 MG tablet Take 3 tablets (60 mg total) by mouth daily. Patient not taking: Reported on 08/15/2016 05/02/16   Hinda Kehr, MD  sulfamethoxazole-trimethoprim (BACTRIM DS,SEPTRA DS) 800-160 MG tablet Take 1 tablet by mouth 2  (two) times daily. 12/31/16   Laban Emperor, PA-C    Allergies Patient has no known allergies.  No family history on file.  Social History Social History  Substance Use Topics  . Smoking status: Former Research scientist (life sciences)  . Smokeless tobacco: Never Used  . Alcohol use Yes     Comment: 1 pint of vodka a day    Review of Systems  Constitutional: Negative for fever. Eyes: Negative for visual changes. ENT: Negative for sore throat. Neck: No neck pain  Cardiovascular: Negative for chest pain. Respiratory: Negative for shortness of breath. Gastrointestinal: Negative for abdominal pain, vomiting or diarrhea. Genitourinary: Negative for dysuria. Musculoskeletal: Negative for back pain. Skin: Negative for rash. Neurological: Negative for headaches, weakness or numbness. + tremor Psych: No SI or HI, + anxiety  ____________________________________________   PHYSICAL EXAM:  VITAL  SIGNS: ED Triage Vitals  Enc Vitals Group     BP 01/26/17 1710 (!) 154/98     Pulse Rate 01/26/17 1710 (!) 102     Resp 01/26/17 1710 17     Temp 01/26/17 1710 98.4 F (36.9 C)     Temp Source 01/26/17 1710 Oral     SpO2 01/26/17 1710 95 %     Weight 01/26/17 1711 190 lb (86.2 kg)     Height 01/26/17 1711 6' (1.829 m)     Head Circumference --      Peak Flow --      Pain Score 01/26/17 1711 8     Pain Loc --      Pain Edu? --      Excl. in [place]? --     Constitutional: Alert and oriented, tremulous, anxious, hypertensive, tachycardic.  HEENT:      Head: Normocephalic and atraumatic.         Eyes: Conjunctivae are normal. Sclera is non-icteric. EOMI. PERRL      Mouth/Throat: Mucous membranes are moist.       Neck: Supple with no signs of meningismus. Cardiovascular: Tachycardic with regular rhythm. No murmurs, gallops, or rubs. 2+ symmetrical distal pulses are present in all extremities. No JVD. Respiratory: Normal respiratory effort. Lungs are clear to auscultation bilaterally. No wheezes, crackles, or  rhonchi.  Gastrointestinal: Soft, non tender, and non distended with positive bowel sounds. No rebound or guarding. Musculoskeletal: Nontender with normal range of motion in all extremities. No edema, cyanosis, or erythema of extremities. Neurologic: Normal speech and language. Face is symmetric. Moving all extremities. No gross focal neurologic deficits are appreciated. Skin: Skin is warm, dry and intact. No rash noted. Psychiatric: Mood and affect are normal. Speech and behavior are normal.  ____________________________________________   LABS (all labs ordered are listed, but only abnormal results are displayed)  Labs Reviewed  CBC WITH DIFFERENTIAL/PLATELET - Abnormal; Notable for the following:       Result Value   RDW 14.9 (*)    All other components within normal limits  COMPREHENSIVE METABOLIC PANEL - Abnormal; Notable for the following:    Glucose, Bld 124 (*)    Calcium 8.5 (*)    Total Protein 6.1 (*)    All other components within normal limits  ETHANOL - Abnormal; Notable for the following:    Alcohol, Ethyl (B) 127 (*)    All other components within normal limits  URINE DRUG SCREEN, QUALITATIVE ([facility] ONLY) - Abnormal; Notable for the following:    Opiate, Ur Screen POSITIVE (*)    Cannabinoid 50 Ng, Ur [place] POSITIVE (*)    Benzodiazepine, Ur Scrn POSITIVE (*)    All other components within normal limits  TROPONIN I   ____________________________________________  EKG  ED ECG REPORT I, Rudene Re, the attending physician, personally viewed and interpreted this ECG.  Normal sinus rhythm, rate of 86, normal intervals, normal axis, no ST elevations or depressions. ____________________________________________  RADIOLOGY  CT head and neck: 1. No CT evidence for acute intracranial abnormality. 2. Straightening of the cervical spine. No acute fracture or malalignment ____________________________________________   PROCEDURES  Procedure(s) performed:  None Procedures Critical Care performed:  None ____________________________________________   INITIAL IMPRESSION / ASSESSMENT AND PLAN / ED COURSE  56 y.o. male the history of alcohol abuse, OCD, hypertension, and gout who presents for evaluation of alcohol withdrawal. Last drink was 48 hours ago when patient started to withdrawal and then took two shots of  vodka PTA. CIWA on arrival 51. Patient given ativan and started on librium taper. Will check basic blood work and reassess. Patient might be well enough for outpatient detox as he has no prior history of, located withdrawals and was able to detox a year ago on Librium taper.  Clinical Course as of Jan 26 2057  Tue Jan 26, 2017  2055 Patient feels markedly improved at this time. CIWa of 2. Alcohol level is 127. Drug tox screen positive for opiates, marijuana, and benzos. Patient is going to be discharged home on a Librium taper. Told patient that he cannot drink and take the benzos as it may cause him to stop breathing and die. Discussed signs and symptoms of complicated withdrawal and recommended that he returns to the ER if these develop. Patient awaiting sober ride to go home.  [CV]    Clinical Course User Index [CV] Rudene Re, MD    Pertinent labs & imaging results that were available during my care of the patient were reviewed by me and considered in my medical decision making (see chart for details).    ____________________________________________   FINAL CLINICAL IMPRESSION(S) / ED DIAGNOSES  Final diagnoses:  Alcohol withdrawal syndrome with complication (HCC)  Neck pain      NEW MEDICATIONS STARTED DURING THIS VISIT:  New Prescriptions   CHLORDIAZEPOXIDE (LIBRIUM) 25 MG CAPSULE    50 mg 3 times a day x 3 days 50 mg 2 times a day for 3 days 50 mg once a day for 3 days stop   ONDANSETRON (ZOFRAN) 4 MG TABLET    Take 1 tablet (4 mg total) by mouth every 8 (eight) hours as needed for nausea or vomiting.      Note:  This document was prepared using Dragon voice recognition software and may include unintentional dictation errors.    Rudene Re, MD 01/26/17 [phone]

## 2017-01-26 NOTE — ED Notes (Signed)
[facility] called to pick pt up and transport home - pt requested this service because he had no other sober ride home - discharge was discussed with pt and he will be given paper work when the taxi arrives for pick up

## 2017-01-26 NOTE — ED Notes (Signed)
Pt reports that he fell last night and hit his right shoulder - he is having pain in right shoulder and neck - he reports having problems with alcohol - states he is having insomnia and episodes of shaking all the time - he reports periods of anxiety that cause shortness of breath - reports he eats one cup of soup a day for the last few months - he feels like he is going to die from the anxiety - family stress (caring for handicapped wife) - pt denies SI or HI - pt reports he drinks 1 pint every day

## 2017-01-26 NOTE — ED Triage Notes (Signed)
Patient presents to ED via POV with c/o upper back pain and neck pain after a fall he had this morning at 0200. Patient denies SI or HI.

## 2017-01-26 NOTE — Discharge Instructions (Signed)
You have been seen in the Emergency Department (ED) today for alcohol intoxication.  As we have discussed, please do NOT drink alcohol in excess as alcohol intoxication can be life threatening. NEVER drive or operate machinery after drinking alcohol.   ° °Do NOT drive today.  Drink plenty of water or other clear liquids (Gatorade, Powerade, etc) for the next 24 hours to help your body recover and stay hydrated. ° °Follow-up care is a key part of your treatment and safety. Follow up with your doctor in the next 2-5 days. If you need help to stop drinking please call [facility] at [phone] ° °How can you care for yourself at home?  °Be safe with medicines. Take your medicines exactly as prescribed. Call your doctor if you think you are having a problem with your medicine.  °Your doctor may have prescribed disulfiram (Antabuse). Do not drink any alcohol while you are taking this medicine. You may have severe or even life-threatening side effects from even small amounts of alcohol.  °If you were given medicine to prevent nausea, be sure to take it exactly as prescribed.  °Before you take any medicine, tell your doctor if:  °You have had a bad reaction to any medicines in the past.  °You are taking other medicines, including over-the-counter ones, or have other health problems.  °You are or could be pregnant. °Be prepared to have some symptoms of withdrawal in the next few days which includes agitation, tremors, anxiety, sweating, headache, nausea, vomiting. °Drink plenty of liquids in the next few days to prevent dehydration. °Seek help if you need it to stop drinking. Getting counseling and joining a support group can help you stay sober. Try a support group such as Alcoholics Anonymous.  °Avoid alcohol when you take medicines. It can react with many medicines and cause serious problems. ° °When should you call for help?  °Call 911 anytime you think you may need emergency care. For example, call if:  °You feel confused  and are seeing things that are not there.  °You are thinking about killing yourself or hurting others.  °You have a seizure.  °You vomit blood or what looks like coffee grounds. ° °Call your doctor now or seek immediate medical care if:  °You have trembling, restlessness, sweating, and other withdrawal symptoms that are new or that get worse.  °Your withdrawal symptoms come back after not bothering you for days or weeks.  °You can't stop vomiting. ° °Watch closely for changes in your health, and be sure to contact your doctor if:  °You need help to stop drinking. ° °

## 2017-01-26 NOTE — ED Triage Notes (Signed)
Patient now reports shaking, insomnia. Last drink was 1100 this afternoon. Patient states "I need help. I need to stop this".

## 2017-02-19 ENCOUNTER — Emergency Department
Admission: EM | Admit: 2017-02-19 | Discharge: 2017-02-19 | Disposition: A | Discharge status: Home / Self Care | Payer: Self-pay | Attending: Emergency Medicine | Admitting: Emergency Medicine

## 2017-02-19 ENCOUNTER — Encounter: Payer: Self-pay | Admitting: Emergency Medicine

## 2017-02-19 DIAGNOSIS — Y999 Unspecified external cause status: Secondary | ICD-10-CM | POA: Insufficient documentation

## 2017-02-19 DIAGNOSIS — Z79899 Other long term (current) drug therapy: Secondary | ICD-10-CM | POA: Insufficient documentation

## 2017-02-19 DIAGNOSIS — W19XXXA Unspecified fall, initial encounter: Secondary | ICD-10-CM | POA: Insufficient documentation

## 2017-02-19 DIAGNOSIS — F10120 Alcohol abuse with intoxication, uncomplicated: Secondary | ICD-10-CM | POA: Insufficient documentation

## 2017-02-19 DIAGNOSIS — S80811A Abrasion, right lower leg, initial encounter: Secondary | ICD-10-CM | POA: Insufficient documentation

## 2017-02-19 DIAGNOSIS — Z87891 Personal history of nicotine dependence: Secondary | ICD-10-CM | POA: Insufficient documentation

## 2017-02-19 DIAGNOSIS — F10920 Alcohol use, unspecified with intoxication, uncomplicated: Secondary | ICD-10-CM

## 2017-02-19 DIAGNOSIS — T148XXA Other injury of unspecified body region, initial encounter: Secondary | ICD-10-CM

## 2017-02-19 DIAGNOSIS — J45909 Unspecified asthma, uncomplicated: Secondary | ICD-10-CM | POA: Insufficient documentation

## 2017-02-19 DIAGNOSIS — M545 Low back pain: Secondary | ICD-10-CM | POA: Insufficient documentation

## 2017-02-19 DIAGNOSIS — I10 Essential (primary) hypertension: Secondary | ICD-10-CM | POA: Insufficient documentation

## 2017-02-19 DIAGNOSIS — Y939 Activity, unspecified: Secondary | ICD-10-CM | POA: Insufficient documentation

## 2017-02-19 DIAGNOSIS — Y929 Unspecified place or not applicable: Secondary | ICD-10-CM | POA: Insufficient documentation

## 2017-02-19 LAB — BASIC METABOLIC PANEL
Anion gap: 12 (ref 5–15)
BUN: 9 mg/dL (ref 6–20)
CO2: 21 mmol/L — ABNORMAL LOW (ref 22–32)
Calcium: 8.9 mg/dL (ref 8.9–10.3)
Chloride: 108 mmol/L (ref 101–111)
Creatinine, Ser: 0.8 mg/dL (ref 0.61–1.24)
GFR calc Af Amer: >60 mL/min (ref >60)
GFR calc non Af Amer: >60 mL/min (ref >60)
Glucose, Bld: 119 mg/dL — ABNORMAL HIGH (ref 65–99)
Potassium: 3.1 mmol/L — ABNORMAL LOW (ref 3.5–5.1)
Sodium: 141 mmol/L (ref 135–145)

## 2017-02-19 LAB — CBC WITH DIFFERENTIAL/PLATELET
Basophils Absolute: 0 10*3/uL (ref 0–0.1)
Basophils Relative: 0 %
Eosinophils Absolute: 0 10*3/uL (ref 0–0.7)
Eosinophils Relative: 0 %
HCT: 45.3 % (ref 40.0–52.0)
Hemoglobin: 15.3 g/dL (ref 13.0–18.0)
Lymphocytes Relative: 22 %
Lymphs Abs: 1.6 10*3/uL (ref 1.0–3.6)
MCH: 31.1 pg (ref 26.0–34.0)
MCHC: 33.9 g/dL (ref 32.0–36.0)
MCV: 92 fL (ref 80.0–100.0)
Monocytes Absolute: 0.7 10*3/uL (ref 0.2–1.0)
Monocytes Relative: 10 %
Neutro Abs: 4.8 10*3/uL (ref 1.4–6.5)
Neutrophils Relative %: 68 %
Platelets: 156 10*3/uL (ref 150–440)
RBC: 4.93 MIL/uL (ref 4.40–5.90)
RDW: 15 % — ABNORMAL HIGH (ref 11.5–14.5)
WBC: 7.2 10*3/uL (ref 3.8–10.6)

## 2017-02-19 LAB — URINALYSIS, COMPLETE (UACMP) WITH MICROSCOPIC
Bacteria, UA: NONE SEEN
Bilirubin Urine: NEGATIVE
Glucose, UA: NEGATIVE mg/dL
Hgb urine dipstick: NEGATIVE
Ketones, ur: NEGATIVE mg/dL
Leukocytes, UA: NEGATIVE
Nitrite: NEGATIVE
Protein, ur: NEGATIVE mg/dL
Specific Gravity, Urine: 1.002 — ABNORMAL LOW (ref 1.005–1.030)
pH: 6 (ref 5.0–8.0)

## 2017-02-19 LAB — ETHANOL: Alcohol, Ethyl (B): 236 mg/dL — ABNORMAL HIGH (ref <5)

## 2017-02-19 MED ORDER — SODIUM CHLORIDE 0.9 % IV BOLUS (SEPSIS)
1000.0000 mL | Freq: Once | INTRAVENOUS | Status: AC
  Administered 2017-02-19: 1000 mL via INTRAVENOUS

## 2017-02-19 NOTE — ED Notes (Signed)
Pt reports not "peeing or eating for two days", pt reports drinking  "bottle of wine" approx two days ago and not taking meds - some sort of taper - for ETOH withdrawal; reports shakes and night sweats from WD but no seizures

## 2017-02-19 NOTE — ED Triage Notes (Signed)
Pt to ed with c/o fall and altered mental status.  Pt alert but appears confused, states he is at the hospital but can not state day of week or year.  Pt with noted laceration to right lower leg.  Pt counting on hands, mumbling under breath and talking to himself. Pt also blinking eyes frequently and odd behavior,  Pt states he is supposed to be taking meds for alcohol detox but has not been taking them,  States "they are still in the ball at home"

## 2017-02-19 NOTE — ED Provider Notes (Signed)
[facility] Emergency Department Provider Note   ____________________________________________   I have reviewed the triage vital signs and the nursing notes.   HISTORY  Chief Complaint Fall and Altered Mental Status   History limited by: Not Limited   HPI Miguel [name] is a 56 y.o. male who presents to the emergency department today because of concern for something being wrong. He states he feels like something is very wrong. He has been having lower back pain. Also states he has not been able to have a bowel movement for weeks. He also states he has been having trouble with urination. Today he states he feel on his new porch. Scrapped his right leg. No fevers. Patient states that he last drank a week and a half ago.   Past Medical History:  Diagnosis Date  . Asthma   . GERD (gastroesophageal reflux disease)   . Gout   . Hypertension   . OCD (obsessive compulsive disorder)   . Renal colic     Patient Active Problem List   Diagnosis Date Noted  . Moderate major depression, single episode ([place]) 08/15/2016  . Substance induced mood disorder ([place]) 08/15/2016  . Self-inflicted injury 84/16/6063  . Involuntary commitment 08/15/2016  . Alcohol abuse 02/05/2016  . Hypertension 12/05/2015  . Tachycardia 12/05/2015  . Gout 11/13/2015  . Chronic back pain 05/02/2015    Past Surgical History:  Procedure Laterality Date  . CHOLECYSTECTOMY  2012    Prior to Admission medications   Medication Sig Start Date End Date Taking? Authorizing Provider  albuterol (VENTOLIN HFA) 108 (90 Base) MCG/ACT inhaler Inhale 2 puffs into the lungs every 4 (four) hours as needed for wheezing or shortness of breath.    Historical Provider, MD  allopurinol (ZYLOPRIM) 300 MG tablet Take 300 mg by mouth every morning.    Historical Provider, MD  chlordiazePOXIDE (LIBRIUM) 25 MG capsule 50 mg 3 times a day x 3 days 50 mg 2 times a day for 3 days 50 mg once a day for 3 days stop  01/26/17   Rudene Re, MD  colchicine 0.6 MG tablet 1 tablet once or twice a day with food 04/30/16 05/31/16  Larene Beach A McGowan, PA-C  cyclobenzaprine (FLEXERIL) 5 MG tablet Take 1 tablet (5 mg total) by mouth 3 (three) times daily as needed for muscle spasms. Patient not taking: Reported on 08/15/2016 07/28/16   Dannielle Karvonen Menshew, PA-C  dexlansoprazole (DEXILANT) 60 MG capsule Take 60 mg by mouth every morning.    Historical Provider, MD  etodolac (LODINE) 200 MG capsule Take 1 capsule (200 mg total) by mouth every 8 (eight) hours. Patient not taking: Reported on 08/15/2016 07/27/16   Loney Hering, MD  etodolac (LODINE) 200 MG capsule Take 1 capsule (200 mg total) by mouth every 8 (eight) hours. 11/19/16   Loney Hering, MD  FLUoxetine (PROZAC) 20 MG capsule Take 1 capsule (20 mg total) by mouth daily. 08/15/16   Gonzella Lex, MD  Fluticasone-Salmeterol (ADVAIR) 100-50 MCG/DOSE AEPB Inhale 1 puff into the lungs 2 (two) times daily.    Historical Provider, MD  HYDROcodone-acetaminophen (NORCO/VICODIN) 5-325 MG tablet Take 1-2 tablets by mouth every 4 (four) hours as needed for moderate pain. Patient not taking: Reported on 08/15/2016 05/02/16   Hinda Kehr, MD  ibuprofen (ADVIL,MOTRIN) 600 MG tablet Take 1 tablet (600 mg total) by mouth every 6 (six) hours as needed. Patient not taking: Reported on 08/15/2016 12/31/15   Hinda Lenis  Smith, PA-C  ketorolac (TORADOL) 10 MG tablet Take 1 tablet (10 mg total) by mouth every 8 (eight) hours. Patient not taking: Reported on 08/15/2016 07/28/16   Dannielle Karvonen Menshew, PA-C  lidocaine (LIDODERM) 5 % Place 1 patch onto the skin every 12 (twelve) hours. Remove & Discard patch within 12 hours or as directed by MD 07/22/16 07/22/17  Nance Pear, MD  lisinopril (PRINIVIL,ZESTRIL) 20 MG tablet Take 20 mg by mouth every morning.     Historical Provider, MD  LORazepam (ATIVAN) 1 MG tablet Take 1 tablet (1 mg total) by mouth 2 (two) times daily as needed for  anxiety. Patient not taking: Reported on 04/30/2016 04/01/16   Daymon Larsen, MD  naproxen (EC NAPROSYN) 500 MG EC tablet Take 1 tablet (500 mg total) by mouth 2 (two) times daily with a meal. Patient not taking: Reported on 08/15/2016 02/13/16   Dannielle Karvonen Menshew, PA-C  ondansetron (ZOFRAN ODT) 4 MG disintegrating tablet Take 1 tablet (4 mg total) by mouth every 8 (eight) hours as needed for nausea or vomiting. 11/19/16   Loney Hering, MD  ondansetron (ZOFRAN) 4 MG tablet Take 1 tablet (4 mg total) by mouth every 8 (eight) hours as needed for nausea or vomiting. 01/26/17   Rudene Re, MD  oxyCODONE-acetaminophen (ROXICET) 5-325 MG tablet Take 1 tablet by mouth every 6 (six) hours as needed. Patient not taking: Reported on 08/15/2016 07/27/16   Loney Hering, MD  potassium chloride (K-DUR) 10 MEQ tablet Take 1 tablet (10 mEq total) by mouth daily. Patient not taking: Reported on 08/15/2016 04/30/16   Larene Beach A McGowan, PA-C  predniSONE (DELTASONE) 20 MG tablet Take 3 tablets (60 mg total) by mouth daily. Patient not taking: Reported on 08/15/2016 05/02/16   Hinda Kehr, MD  sulfamethoxazole-trimethoprim (BACTRIM DS,SEPTRA DS) 800-160 MG tablet Take 1 tablet by mouth 2 (two) times daily. 12/31/16   Laban Emperor, PA-C    Allergies Patient has no known allergies.  History reviewed. No pertinent family history.  Social History Social History  Substance Use Topics  . Smoking status: Former Research scientist (life sciences)  . Smokeless tobacco: Never Used  . Alcohol use Yes     Comment: 1 pint of vodka a day    Review of Systems  Constitutional: Negative for fever. Cardiovascular: Negative for chest pain. Respiratory: Negative for shortness of breath. Gastrointestinal: Negative for abdominal pain, vomiting and diarrhea. Genitourinary: Inability to urinate.  Musculoskeletal: Positive for low back pain. Skin: Cut to right leg. Neurological: Negative for headaches, focal weakness or  numbness.  10-point ROS otherwise negative.  ____________________________________________   PHYSICAL EXAM:  VITAL SIGNS: ED Triage Vitals [02/19/17 1841]  Enc Vitals Group     BP 163/100     Pulse 113     Resp 13     Temp 98.3     Temp src      SpO2 93     Weight      Height      Head Circumference      Peak Flow      Pain Score 10   Constitutional: Awake and alert. No acute distress. Appears intoxicated.  Eyes: Conjunctivae are normal. Normal extraocular movements. ENT   Head: Normocephalic and atraumatic.   Nose: No congestion/rhinnorhea.   Mouth/Throat: Mucous membranes are moist.   Neck: No stridor. Hematological/Lymphatic/Immunilogical: No cervical lymphadenopathy. Cardiovascular: Normal rate, regular rhythm.  No murmurs, rubs, or gallops.  Respiratory: Normal respiratory effort without tachypnea nor retractions.  Breath sounds are clear and equal bilaterally. No wheezes/rales/rhonchi. Gastrointestinal: Soft and non tender. No rebound. No guarding.  Genitourinary: Deferred Musculoskeletal: Normal range of motion in all extremities. No lower extremity edema. Neurologic:  Normal speech and language. No gross focal neurologic deficits are appreciated.  Skin:  Abrasion noted to right lower leg. Psychiatric: Appears intoxicated.  ____________________________________________    LABS (pertinent positives/negatives)  Labs Reviewed  ETHANOL - Abnormal; Notable for the following:       Result Value   Alcohol, Ethyl (B) 236 (*)    All other components within normal limits  CBC WITH DIFFERENTIAL/PLATELET - Abnormal; Notable for the following:    RDW 15.0 (*)    All other components within normal limits  BASIC METABOLIC PANEL - Abnormal; Notable for the following:    Potassium 3.1 (*)    CO2 21 (*)    Glucose, Bld 119 (*)    All other components within normal limits  URINALYSIS, COMPLETE (UACMP) WITH MICROSCOPIC - Abnormal; Notable for the following:     Color, Urine STRAW (*)    APPearance CLEAR (*)    Specific Gravity, Urine 1.002 (*)    Squamous Epithelial / LPF 0-5 (*)    All other components within normal limits     ____________________________________________   EKG  None  ____________________________________________    RADIOLOGY  None  ____________________________________________   PROCEDURES  Procedures  ____________________________________________   INITIAL IMPRESSION / ASSESSMENT AND PLAN / ED COURSE  Pertinent labs & imaging results that were available during my care of the patient were reviewed by me and considered in my medical decision making (see chart for details).  Patient resented to the emergency department today with multiple medical complaints. He is well-known to this emergency department. Initially patient stated he did not drink for the past week and a half. Patient's ethanol level however was elevated. That is consistent with clinical exam. When asked again patient states that he did drink today. Patient was observed here in the emergency department and did appear to sober up. Feel he is safe for discharge.  ____________________________________________   FINAL CLINICAL IMPRESSION(S) / ED DIAGNOSES  Final diagnoses:  Alcoholic intoxication without complication ([place])  Abrasion     Note: This dictation was prepared with Dragon dictation. Any transcriptional errors that result from this process are unintentional     Nance Pear, MD 02/19/17 2250

## 2017-02-19 NOTE — Discharge Instructions (Signed)
Please seek medical attention for any high fevers, chest pain, shortness of breath, change in behavior, persistent vomiting, bloody stool or any other new or concerning symptoms.  

## 2017-02-20 NOTE — ED Notes (Signed)
Miguel [name], [phone], called, given HIPAA compliant info, reports "I Noell't know how much my husband drinks; he keeps it from me"

## 2017-02-21 ENCOUNTER — Emergency Department
Admission: EM | Admit: 2017-02-21 | Discharge: 2017-02-21 | Disposition: A | Discharge status: Home / Self Care | Payer: Self-pay | Attending: Emergency Medicine | Admitting: Emergency Medicine

## 2017-02-21 ENCOUNTER — Encounter: Payer: Self-pay | Admitting: Emergency Medicine

## 2017-02-21 ENCOUNTER — Emergency Department: Payer: Self-pay

## 2017-02-21 DIAGNOSIS — M10062 Idiopathic gout, left knee: Secondary | ICD-10-CM | POA: Insufficient documentation

## 2017-02-21 DIAGNOSIS — Z87891 Personal history of nicotine dependence: Secondary | ICD-10-CM | POA: Insufficient documentation

## 2017-02-21 DIAGNOSIS — I10 Essential (primary) hypertension: Secondary | ICD-10-CM | POA: Insufficient documentation

## 2017-02-21 DIAGNOSIS — M109 Gout, unspecified: Secondary | ICD-10-CM

## 2017-02-21 DIAGNOSIS — Z79899 Other long term (current) drug therapy: Secondary | ICD-10-CM | POA: Insufficient documentation

## 2017-02-21 DIAGNOSIS — M25562 Pain in left knee: Secondary | ICD-10-CM

## 2017-02-21 DIAGNOSIS — J45909 Unspecified asthma, uncomplicated: Secondary | ICD-10-CM | POA: Insufficient documentation

## 2017-02-21 LAB — CBC
HCT: 40.8 % (ref 40.0–52.0)
Hemoglobin: 14 g/dL (ref 13.0–18.0)
MCH: 31.4 pg (ref 26.0–34.0)
MCHC: 34.4 g/dL (ref 32.0–36.0)
MCV: 91.3 fL (ref 80.0–100.0)
Platelets: 153 10*3/uL (ref 150–440)
RBC: 4.47 MIL/uL (ref 4.40–5.90)
RDW: 14.9 % — ABNORMAL HIGH (ref 11.5–14.5)
WBC: 8.5 10*3/uL (ref 3.8–10.6)

## 2017-02-21 LAB — COMPREHENSIVE METABOLIC PANEL
ALT: 15 U/L — ABNORMAL LOW (ref 17–63)
AST: 25 U/L (ref 15–41)
Albumin: 4 g/dL (ref 3.5–5.0)
Alkaline Phosphatase: 60 U/L (ref 38–126)
Anion gap: 9 (ref 5–15)
BUN: 8 mg/dL (ref 6–20)
CO2: 25 mmol/L (ref 22–32)
Calcium: 9.1 mg/dL (ref 8.9–10.3)
Chloride: 106 mmol/L (ref 101–111)
Creatinine, Ser: 0.88 mg/dL (ref 0.61–1.24)
GFR calc Af Amer: >60 mL/min (ref >60)
GFR calc non Af Amer: >60 mL/min (ref >60)
Glucose, Bld: 127 mg/dL — ABNORMAL HIGH (ref 65–99)
Potassium: 3.1 mmol/L — ABNORMAL LOW (ref 3.5–5.1)
Sodium: 140 mmol/L (ref 135–145)
Total Bilirubin: 1.7 mg/dL — ABNORMAL HIGH (ref 0.3–1.2)
Total Protein: 6.6 g/dL (ref 6.5–8.1)

## 2017-02-21 LAB — URIC ACID: Uric Acid, Serum: 7.2 mg/dL (ref 4.4–7.6)

## 2017-02-21 MED ORDER — LORAZEPAM 1 MG PO TABS
1.0000 mg | ORAL_TABLET | Freq: Once | ORAL | Status: AC
  Administered 2017-02-21: 1 mg via ORAL
  Filled 2017-02-21: qty 1

## 2017-02-21 NOTE — ED Notes (Signed)
Pt. Given ace wrap, pt wants to take with him to put on when he goes to floor and spend night with wife.

## 2017-02-21 NOTE — ED Provider Notes (Signed)
[facility] Emergency Department Provider Note  ____________________________________________  Time seen: Approximately 7:46 PM  I have reviewed the triage vital signs and the nursing notes.   HISTORY  Chief Complaint Gout    HPI Miguel [name] is a 56 y.o. male that presents to the emergency department with left knee pain since this morning. Patient states that he woke up and knee was painful to move and hot to touch. He states that it is painful to extend knee. He states that this feels exactly like when he has had gout in the past. Patient fell a couple of days ago but thinks he fell on his right knee and not his left knee. Patient takes allopurinol daily. He took 2 colchicines this morning. Symptoms improved with colchicine. Patient's wife is admitted to the hospital and patient is spending the night in the hospital with her. He states that he has a lot of anxiety about her being here and has had difficulty sleeping the last couple nights from this. Patient quit smoking years ago. No recreational drug use. No recent surgeries or recent travel. He's never had a blood clot. Patient denies fever, shortness of breath, chest pain, nausea, vomiting, abdominal pain, calf pain, numbness, tingling.   Past Medical History:  Diagnosis Date  . Asthma   . GERD (gastroesophageal reflux disease)   . Gout   . Hypertension   . OCD (obsessive compulsive disorder)   . Renal colic     Patient Active Problem List   Diagnosis Date Noted  . Moderate major depression, single episode ([place]) 08/15/2016  . Substance induced mood disorder ([place]) 08/15/2016  . Self-inflicted injury 93/26/7124  . Involuntary commitment 08/15/2016  . Alcohol abuse 02/05/2016  . Hypertension 12/05/2015  . Tachycardia 12/05/2015  . Gout 11/13/2015  . Chronic back pain 05/02/2015    Past Surgical History:  Procedure Laterality Date  . CHOLECYSTECTOMY  2012    Prior to Admission medications    Medication Sig Start Date End Date Taking? Authorizing Provider  albuterol (VENTOLIN HFA) 108 (90 Base) MCG/ACT inhaler Inhale 2 puffs into the lungs every 4 (four) hours as needed for wheezing or shortness of breath.    Historical Provider, MD  allopurinol (ZYLOPRIM) 300 MG tablet Take 300 mg by mouth every morning.    Historical Provider, MD  colchicine 0.6 MG tablet 1 tablet once or twice a day with food Patient not taking: Reported on 02/19/2017 04/30/16 05/31/16  Larene Beach A McGowan, PA-C  dexlansoprazole (DEXILANT) 60 MG capsule Take 60 mg by mouth every morning.    Historical Provider, MD  etodolac (LODINE) 200 MG capsule Take 1 capsule (200 mg total) by mouth every 8 (eight) hours. Patient not taking: Reported on 08/15/2016 07/27/16   Loney Hering, MD  etodolac (LODINE) 200 MG capsule Take 1 capsule (200 mg total) by mouth every 8 (eight) hours. Patient not taking: Reported on 02/19/2017 11/19/16   Loney Hering, MD  FLUoxetine (PROZAC) 20 MG capsule Take 1 capsule (20 mg total) by mouth daily. Patient not taking: Reported on 02/19/2017 08/15/16   Gonzella Lex, MD  Fluticasone-Salmeterol (ADVAIR) 100-50 MCG/DOSE AEPB Inhale 1 puff into the lungs 2 (two) times daily.    Historical Provider, MD  HYDROcodone-acetaminophen (NORCO/VICODIN) 5-325 MG tablet Take 1-2 tablets by mouth every 4 (four) hours as needed for moderate pain. Patient not taking: Reported on 08/15/2016 05/02/16   Hinda Kehr, MD  ibuprofen (ADVIL,MOTRIN) 600 MG tablet Take 1 tablet (600  mg total) by mouth every 6 (six) hours as needed. Patient not taking: Reported on 08/15/2016 12/31/15   Sable Feil, PA-C  ketorolac (TORADOL) 10 MG tablet Take 1 tablet (10 mg total) by mouth every 8 (eight) hours. Patient not taking: Reported on 08/15/2016 07/28/16   Dannielle Karvonen Menshew, PA-C  lidocaine (LIDODERM) 5 % Place 1 patch onto the skin every 12 (twelve) hours. Remove & Discard patch within 12 hours or as directed by MD Patient not  taking: Reported on 02/19/2017 07/22/16 07/22/17  Nance Pear, MD  lisinopril (PRINIVIL,ZESTRIL) 20 MG tablet Take 20 mg by mouth every morning.     Historical Provider, MD  LORazepam (ATIVAN) 1 MG tablet Take 1 tablet (1 mg total) by mouth 2 (two) times daily as needed for anxiety. Patient not taking: Reported on 04/30/2016 04/01/16   Daymon Larsen, MD  naproxen (EC NAPROSYN) 500 MG EC tablet Take 1 tablet (500 mg total) by mouth 2 (two) times daily with a meal. Patient not taking: Reported on 08/15/2016 02/13/16   Alita Chyle Bacon Menshew, PA-C  omeprazole (PRILOSEC) 20 MG capsule Take 20 mg by mouth daily.    Historical Provider, MD  ondansetron (ZOFRAN ODT) 4 MG disintegrating tablet Take 1 tablet (4 mg total) by mouth every 8 (eight) hours as needed for nausea or vomiting. Patient not taking: Reported on 02/19/2017 11/19/16   Loney Hering, MD  ondansetron (ZOFRAN) 4 MG tablet Take 1 tablet (4 mg total) by mouth every 8 (eight) hours as needed for nausea or vomiting. Patient not taking: Reported on 02/19/2017 01/26/17   Rudene Re, MD  oxyCODONE-acetaminophen (ROXICET) 5-325 MG tablet Take 1 tablet by mouth every 6 (six) hours as needed. Patient not taking: Reported on 08/15/2016 07/27/16   Loney Hering, MD  potassium chloride (K-DUR) 10 MEQ tablet Take 1 tablet (10 mEq total) by mouth daily. Patient not taking: Reported on 08/15/2016 04/30/16   Nori Riis, PA-C    Allergies Patient has no known allergies.  No family history on file.  Social History Social History  Substance Use Topics  . Smoking status: Former Research scientist (life sciences)  . Smokeless tobacco: Never Used  . Alcohol use Yes     Comment: 1 pint of vodka a day     Review of Systems  Constitutional: No fever/chills ENT: No upper respiratory complaints. Cardiovascular: No chest pain. Respiratory: No cough. No SOB. Gastrointestinal: No abdominal pain.  No nausea, no vomiting.  Musculoskeletal: Positive for knee pain. Skin:  Negative for rash, abrasions, lacerations, ecchymosis. Neurological: Negative for headaches, numbness or tingling   ____________________________________________   PHYSICAL EXAM:  VITAL SIGNS: ED Triage Vitals  Enc Vitals Group     BP 02/21/17 1706 (!) 172/96     Pulse Rate 02/21/17 1706 (!) 104     Resp 02/21/17 1706 18     Temp 02/21/17 1706 98.6 F (37 C)     Temp Source 02/21/17 1706 Oral     SpO2 02/21/17 1706 96 %     Weight 02/21/17 1657 200 lb (90.7 kg)     Height 02/21/17 1657 6' (1.829 m)     Head Circumference --      Peak Flow --      Pain Score 02/21/17 1655 10     Pain Loc --      Pain Edu? --      Excl. in [place]? --      Constitutional: Alert and oriented. Well appearing  and in no acute distress. Eyes: Conjunctivae are normal. PERRL. EOMI. Head: Atraumatic. ENT:      Ears:      Nose: No congestion/rhinnorhea.      Mouth/Throat: Mucous membranes are moist.  Neck: No stridor.   Cardiovascular: Normal rate, regular rhythm.  Good peripheral circulation. Respiratory: Normal respiratory effort without tachypnea or retractions. Lungs CTAB. Good air entry to the bases with no decreased or absent breath sounds. Musculoskeletal: Full range of motion to all extremities. No gross deformities appreciated. Tenderness to palpation over inferior pole of patella and in popliteal fossa. Mild swelling over lateral knee. Anterior knee warm to touch. No erythema. Neurologic:  Normal speech and language. No gross focal neurologic deficits are appreciated.  Skin:  Skin is warm, dry and intact. No rash noted.   ____________________________________________   LABS (all labs ordered are listed, but only abnormal results are displayed)  Labs Reviewed  CBC - Abnormal; Notable for the following:       Result Value   RDW 14.9 (*)    All other components within normal limits  COMPREHENSIVE METABOLIC PANEL - Abnormal; Notable for the following:    Potassium 3.1 (*)    Glucose, Bld  127 (*)    ALT 15 (*)    Total Bilirubin 1.7 (*)    All other components within normal limits  URIC ACID   ____________________________________________  EKG   ____________________________________________  RADIOLOGY Robinette Haines, personally viewed and evaluated these images (plain radiographs) as part of my medical decision making, as well as reviewing the written report by the radiologist.  Dg Knee 2 Views Left  Result Date: 02/21/2017 CLINICAL DATA:  Recent fall.  Left knee pain and swelling. EXAM: LEFT KNEE - 1-2 VIEW COMPARISON:  None. FINDINGS: No evidence of fracture, dislocation, or joint effusion. No evidence of arthropathy or other focal bone abnormality. Soft tissues are unremarkable. IMPRESSION: No left knee fracture, joint effusion or malalignment. Electronically Signed   By: Ilona Sorrel M.D.   On: 02/21/2017 18:39    ____________________________________________    PROCEDURES  Procedure(s) performed:    Procedures    Medications  LORazepam (ATIVAN) tablet 1 mg (1 mg Oral Given 02/21/17 1936)     ____________________________________________   INITIAL IMPRESSION / ASSESSMENT AND PLAN / ED COURSE  Pertinent labs & imaging results that were available during my care of the patient were reviewed by me and considered in my medical decision making (see chart for details).  Review of the [place] CSRS was performed in accordance of the [place] prior to dispensing any controlled drugs.    Patient presented to the emergency department with acute knee pain for one day. Vital signs and exam are reassuring. Symptoms are consistent with gout. Uric acid on the upper limit of normal. Patient takes allopurinol daily and took 2 colchicines in this morning. Patient recently fell but does not think he hit left knee. Knee x-ray negative for acute bony abnormalities per radiology. No evidence of DVT or septic joint. Patient is to follow up with PCP as directed. Patient is given ED  precautions to return to the ED for any worsening or new symptoms.     ____________________________________________  FINAL CLINICAL IMPRESSION(S) / ED DIAGNOSES  Final diagnoses:  Acute gout of left knee, unspecified cause  Acute pain of left knee      NEW MEDICATIONS STARTED DURING THIS VISIT:  Current Discharge Medication List          This chart  was dictated using voice recognition software/Dragon. Despite best efforts to proofread, errors can occur which can change the meaning. Any change was purely unintentional.    Laban Emperor, PA-C 02/21/17 2008    Darel Hong, MD 02/24/17 1058

## 2017-02-21 NOTE — ED Triage Notes (Signed)
Patient presents to the ED with painful left knee and reports history of gout in this knee and states this feels the same.  Patient reports he takes allopurinol daily and has taken two doses of colchicine today.  Patient is in no obvious distress at this time.

## 2017-02-26 ENCOUNTER — Encounter: Payer: Self-pay | Admitting: Emergency Medicine

## 2017-02-26 ENCOUNTER — Emergency Department
Admission: EM | Admit: 2017-02-26 | Discharge: 2017-02-27 | Disposition: A | Discharge status: Home / Self Care | Payer: Self-pay | Attending: Emergency Medicine | Admitting: Emergency Medicine

## 2017-02-26 ENCOUNTER — Emergency Department: Payer: Self-pay

## 2017-02-26 DIAGNOSIS — M542 Cervicalgia: Secondary | ICD-10-CM | POA: Insufficient documentation

## 2017-02-26 DIAGNOSIS — I10 Essential (primary) hypertension: Secondary | ICD-10-CM | POA: Insufficient documentation

## 2017-02-26 DIAGNOSIS — W19XXXA Unspecified fall, initial encounter: Secondary | ICD-10-CM | POA: Insufficient documentation

## 2017-02-26 DIAGNOSIS — Z79899 Other long term (current) drug therapy: Secondary | ICD-10-CM | POA: Insufficient documentation

## 2017-02-26 DIAGNOSIS — Y939 Activity, unspecified: Secondary | ICD-10-CM | POA: Insufficient documentation

## 2017-02-26 DIAGNOSIS — J45909 Unspecified asthma, uncomplicated: Secondary | ICD-10-CM | POA: Insufficient documentation

## 2017-02-26 DIAGNOSIS — Y999 Unspecified external cause status: Secondary | ICD-10-CM | POA: Insufficient documentation

## 2017-02-26 DIAGNOSIS — Z87891 Personal history of nicotine dependence: Secondary | ICD-10-CM | POA: Insufficient documentation

## 2017-02-26 DIAGNOSIS — S51812A Laceration without foreign body of left forearm, initial encounter: Secondary | ICD-10-CM | POA: Insufficient documentation

## 2017-02-26 DIAGNOSIS — Y92009 Unspecified place in unspecified non-institutional (private) residence as the place of occurrence of the external cause: Secondary | ICD-10-CM | POA: Insufficient documentation

## 2017-02-26 DIAGNOSIS — F10120 Alcohol abuse with intoxication, uncomplicated: Secondary | ICD-10-CM | POA: Insufficient documentation

## 2017-02-26 DIAGNOSIS — R42 Dizziness and giddiness: Secondary | ICD-10-CM | POA: Insufficient documentation

## 2017-02-26 DIAGNOSIS — F10920 Alcohol use, unspecified with intoxication, uncomplicated: Secondary | ICD-10-CM

## 2017-02-26 HISTORY — DX: Alcohol abuse, uncomplicated: F10.10

## 2017-02-26 LAB — CBC
HCT: 39 % — ABNORMAL LOW (ref 40.0–52.0)
Hemoglobin: 13.2 g/dL (ref 13.0–18.0)
MCH: 30.6 pg (ref 26.0–34.0)
MCHC: 33.9 g/dL (ref 32.0–36.0)
MCV: 90.4 fL (ref 80.0–100.0)
Platelets: 321 10*3/uL (ref 150–440)
RBC: 4.32 MIL/uL — ABNORMAL LOW (ref 4.40–5.90)
RDW: 15.3 % — ABNORMAL HIGH (ref 11.5–14.5)
WBC: 5.3 10*3/uL (ref 3.8–10.6)

## 2017-02-26 LAB — COMPREHENSIVE METABOLIC PANEL
ALT: 23 U/L (ref 17–63)
AST: 27 U/L (ref 15–41)
Albumin: 3.8 g/dL (ref 3.5–5.0)
Alkaline Phosphatase: 67 U/L (ref 38–126)
Anion gap: 11 (ref 5–15)
BUN: 6 mg/dL (ref 6–20)
CO2: 26 mmol/L (ref 22–32)
Calcium: 9.2 mg/dL (ref 8.9–10.3)
Chloride: 108 mmol/L (ref 101–111)
Creatinine, Ser: 0.89 mg/dL (ref 0.61–1.24)
GFR calc Af Amer: >60 mL/min (ref >60)
GFR calc non Af Amer: >60 mL/min (ref >60)
Glucose, Bld: 123 mg/dL — ABNORMAL HIGH (ref 65–99)
Potassium: 2.9 mmol/L — ABNORMAL LOW (ref 3.5–5.1)
Sodium: 145 mmol/L (ref 135–145)
Total Bilirubin: 0.4 mg/dL (ref 0.3–1.2)
Total Protein: 6.7 g/dL (ref 6.5–8.1)

## 2017-02-26 LAB — URINE DRUG SCREEN, QUALITATIVE (ARMC ONLY)
Amphetamines, Ur Screen: NOT DETECTED
Barbiturates, Ur Screen: NOT DETECTED
Benzodiazepine, Ur Scrn: POSITIVE — AB
Cannabinoid 50 Ng, Ur ~~LOC~~: NOT DETECTED
Cocaine Metabolite,Ur ~~LOC~~: NOT DETECTED
MDMA (Ecstasy)Ur Screen: NOT DETECTED
Methadone Scn, Ur: NOT DETECTED
Opiate, Ur Screen: NOT DETECTED
Phencyclidine (PCP) Ur S: NOT DETECTED
Tricyclic, Ur Screen: NOT DETECTED

## 2017-02-26 LAB — SALICYLATE LEVEL: Salicylate Lvl: <7 mg/dL (ref 2.8–30.0)

## 2017-02-26 LAB — ACETAMINOPHEN LEVEL: Acetaminophen (Tylenol), Serum: <10 ug/mL — ABNORMAL LOW (ref 10–30)

## 2017-02-26 LAB — ETHANOL: Alcohol, Ethyl (B): 325 mg/dL (ref <5)

## 2017-02-26 NOTE — ED Notes (Signed)
Pt O2 was 80's room air. Placed on 2L [place]

## 2017-02-26 NOTE — ED Notes (Signed)
O2 at 88% 2L [place] patient is snoring. Will continue to monitor.

## 2017-02-26 NOTE — ED Notes (Addendum)
Patient stated, "I drank 2 apple cider drinks." patient becomes tearful when talking about his wife who is admitted to [facility] with health issues.

## 2017-02-26 NOTE — ED Triage Notes (Signed)
Patient comes from home via [facility] with multiple falls. Patient fell Wednesday with abrasion to left forearm but refused to come to hospital. Patient today was drinking "I got dizzy and fell." Patient wife is also in the hospital. Per EMS patient c/o back and neck pain from fall. Patient is tearful.

## 2017-02-26 NOTE — ED Notes (Signed)
Patient transported to CT 

## 2017-02-26 NOTE — ED Provider Notes (Signed)
[facility] Emergency Department Provider Note  Time seen: 9:28 PM  I have reviewed the triage vital signs and the nursing notes.   HISTORY  Chief Complaint Fall and Alcohol Intoxication    HPI Miguel [name] is a 56 y.o. male with a past medical history of alcohol abuse, gastric reflux, gout, hypertension, presents to the emergency department after a fall. According to EMS patienthead multiple falls in the last few days. Admits to drinking alcohol including today. Patient has a small laceration to left forearm, he states he wlding a knifeen he fell cahis arm. Adamantly denies any intentional injury. Patient denies any recreational drug use. Patient does have a history of depression although denies any SI or HI at this time. Patient has no medical complaints or pain complaints currently.  Past Medical History:  Diagnosis Date  . Alcohol abuse   . Asthma   . GERD (gastroesophageal reflux disease)   . Gout   . Hypertension   . OCD (obsessive compulsive disorder)   . Renal colic     Patient Active Problem List   Diagnosis Date Noted  . Moderate major depression, single episode ([place]) 08/15/2016  . Substance induced mood disorder ([place]) 08/15/2016  . Self-inflicted injury 21/30/8657  . Involuntary commitment 08/15/2016  . Alcohol abuse 02/05/2016  . Hypertension 12/05/2015  . Tachycardia 12/05/2015  . Gout 11/13/2015  . Chronic back pain 05/02/2015    Past Surgical History:  Procedure Laterality Date  . CHOLECYSTECTOMY  2012    Prior to Admission medications   Medication Sig Start Date End Date Taking? Authorizing Provider  albuterol (VENTOLIN HFA) 108 (90 Base) MCG/ACT inhaler Inhale 2 puffs into the lungs every 4 (four) hours as needed for wheezing or shortness of breath.    Historical Provider, MD  allopurinol (ZYLOPRIM) 300 MG tablet Take 300 mg by mouth every morning.    Historical Provider, MD  colchicine 0.6 MG tablet 1 tablet once or twice a day  with food Patient not taking: Reported on 02/19/2017 04/30/16 05/31/16  Larene Beach A McGowan, PA-C  dexlansoprazole (DEXILANT) 60 MG capsule Take 60 mg by mouth every morning.    Historical Provider, MD  etodolac (LODINE) 200 MG capsule Take 1 capsule (200 mg total) by mouth every 8 (eight) hours. Patient not taking: Reported on 08/15/2016 07/27/16   Loney Hering, MD  etodolac (LODINE) 200 MG capsule Take 1 capsule (200 mg total) by mouth every 8 (eight) hours. Patient not taking: Reported on 02/19/2017 11/19/16   Loney Hering, MD  FLUoxetine (PROZAC) 20 MG capsule Take 1 capsule (20 mg total) by mouth daily. Patient not taking: Reported on 02/19/2017 08/15/16   Gonzella Lex, MD  Fluticasone-Salmeterol (ADVAIR) 100-50 MCG/DOSE AEPB Inhale 1 puff into the lungs 2 (two) times daily.    Historical Provider, MD  HYDROcodone-acetaminophen (NORCO/VICODIN) 5-325 MG tablet Take 1-2 tablets by mouth every 4 (four) hours as needed for moderate pain. Patient not taking: Reported on 08/15/2016 05/02/16   Hinda Kehr, MD  ibuprofen (ADVIL,MOTRIN) 600 MG tablet Take 1 tablet (600 mg total) by mouth every 6 (six) hours as needed. Patient not taking: Reported on 08/15/2016 12/31/15   Sable Feil, PA-C  ketorolac (TORADOL) 10 MG tablet Take 1 tablet (10 mg total) by mouth every 8 (eight) hours. Patient not taking: Reported on 08/15/2016 07/28/16   Dannielle Karvonen Menshew, PA-C  lidocaine (LIDODERM) 5 % Place 1 patch onto the skin every 12 (twelve) hours. Remove &  Discard patch within 12 hours or as directed by MD Patient not taking: Reported on 02/19/2017 07/22/16 07/22/17  Nance Pear, MD  lisinopril (PRINIVIL,ZESTRIL) 20 MG tablet Take 20 mg by mouth every morning.     Historical Provider, MD  LORazepam (ATIVAN) 1 MG tablet Take 1 tablet (1 mg total) by mouth 2 (two) times daily as needed for anxiety. Patient not taking: Reported on 04/30/2016 04/01/16   Daymon Larsen, MD  naproxen (EC NAPROSYN) 500 MG EC tablet Take 1  tablet (500 mg total) by mouth 2 (two) times daily with a meal. Patient not taking: Reported on 08/15/2016 02/13/16   Alita Chyle Bacon Menshew, PA-C  omeprazole (PRILOSEC) 20 MG capsule Take 20 mg by mouth daily.    Historical Provider, MD  ondansetron (ZOFRAN ODT) 4 MG disintegrating tablet Take 1 tablet (4 mg total) by mouth every 8 (eight) hours as needed for nausea or vomiting. Patient not taking: Reported on 02/19/2017 11/19/16   Loney Hering, MD  ondansetron (ZOFRAN) 4 MG tablet Take 1 tablet (4 mg total) by mouth every 8 (eight) hours as needed for nausea or vomiting. Patient not taking: Reported on 02/19/2017 01/26/17   Rudene Re, MD  oxyCODONE-acetaminophen (ROXICET) 5-325 MG tablet Take 1 tablet by mouth every 6 (six) hours as needed. Patient not taking: Reported on 08/15/2016 07/27/16   Loney Hering, MD  potassium chloride (K-DUR) 10 MEQ tablet Take 1 tablet (10 mEq total) by mouth daily. Patient not taking: Reported on 08/15/2016 04/30/16   Nori Riis, PA-C    No Known Allergies  No family history on file.  Social History Social History  Substance Use Topics  . Smoking status: Former Research scientist (life sciences)  . Smokeless tobacco: Never Used  . Alcohol use Yes     Comment: 1 pint of vodka a day    Review of Systems Constitutional: Negative for fever. Cardiovascular: Negative for chest pain. Respiratory: Negative for shortness of breath. Gastrointestinal: Negative for abdominal pain Neurological: Negative for headache 10-point ROS otherwise negative.  ____________________________________________   PHYSICAL EXAM:  VITAL SIGNS: ED Triage Vitals  Enc Vitals Group     BP 02/26/17 2048 139/83     Pulse Rate 02/26/17 2048 94     Resp 02/26/17 2048 15     Temp 02/26/17 2048 97.5 F (36.4 C)     Temp Source 02/26/17 2048 Oral     SpO2 02/26/17 2048 94 %     Weight 02/26/17 2042 200 lb (90.7 kg)     Height 02/26/17 2042 6' (1.829 m)     Head Circumference --      Peak  Flow --      Pain Score 02/26/17 2041 8     Pain Loc --      Pain Edu? --      Excl. in [place]? --     Constitutional: Alert. Slurred speech, appears intoxicated. Eyes: Normal exam ENT   Head: Normocephalic and atraumatic.   Mouth/Throat: Mucous membranes are moist. Cardiovascular: Normal rate, regular rhythm. No murmur Respiratory: Normal respiratory effort without tachypnea nor retractions. Breath sounds are clear  Gastrointestinal: Soft and nontender. No distention.  Musculoskeletal: Nontender with normal range of motion in all extremities. No C-spine tenderness to palpation. Neurologic:  Normal speech and language. No gross focal neurologic deficits  Skin:  Skin is warm, dry. Small 2 cm hemostatic laceration which appears to be largely superficial to left forearm. Psychiatric: Mood and affect are normal.  ____________________________________________      RADIOLOGY  IMPRESSION: 1. No evidence of traumatic intracranial injury or fracture. 2. No evidence of fracture or subluxation along the cervical spine. 3. Mild cortical volume loss and scattered small vessel ischemic microangiopathy. 4. Mild calcification at the carotid bifurcations bilaterally. Carotid ultrasound could be considered for further evaluation, when and as deemed clinically appropriate. 5. Degenerative cystic change at the right side of the mandible.  ____________________________________________   INITIAL IMPRESSION / ASSESSMENT AND PLAN / ED COURSE  Pertinent labs & imaging results that were available during my care of the patient were reviewed by me and considered in my medical decision making (see chart for details).  patient presents to the emergency department with alcohol intoxication and possible fall today. Patient did state at one point his neck hurt however during my examination of patient denied any pain. As a precaution given his alcohol intoxication we'll obtain a CT scan of the head and  neck. We will check labs and close monitor in the emergency department.  patient does have a small laceration which is hemostatic and nontender gaping to left forearm, denies any intentional harm states this occurred while he fell today.  Patient's labs are largely at baseline besides an elevated ethanol level of 325. We will continue to monitor the patient in the emergency department. Patient states he has no one that can come pick him up and as he is acutely intoxicated we will allow the patient to sober in the emergency department before discharge home. Patient care signed out to oncoming physician.  ____________________________________________   FINAL CLINICAL IMPRESSION(S) / ED DIAGNOSES  alcohol intoxication    Harvest Dark, MD 02/26/17 [phone]

## 2017-02-27 MED ORDER — LIDOCAINE 5 % EX PTCH: 1.0000 | MEDICATED_PATCH | Freq: Two times a day (BID) | CUTANEOUS | 0 refills | Status: DC

## 2017-02-27 MED ORDER — IBUPROFEN 600 MG PO TABS
600.0000 mg | ORAL_TABLET | Freq: Once | ORAL | Status: AC
  Administered 2017-02-27: 600 mg via ORAL
  Filled 2017-02-27: qty 1

## 2017-02-27 MED ORDER — LIDOCAINE 5 % EX PTCH
1.0000 | MEDICATED_PATCH | CUTANEOUS | Status: DC
  Administered 2017-02-27: 1 via TRANSDERMAL
  Filled 2017-02-27: qty 1

## 2017-02-27 NOTE — ED Notes (Signed)
Pt sent home via [employer]. Pt ambulatory with rn to discharge.

## 2017-02-27 NOTE — ED Provider Notes (Signed)
-----------------------------------------   3:23 AM on 02/27/2017 -----------------------------------------   Blood pressure (!) 144/90, pulse 88, temperature 97.5 F (36.4 C), temperature source Oral, resp. rate 14, height 6' (1.829 m), weight 200 lb (90.7 kg), SpO2 98 %.  Assuming care from Dr. Kerman Passey.  In short, Miguel [name] is a 56 y.o. male with a chief complaint of Fall and Alcohol Intoxication .  Refer to the original H&P for additional details.  The current plan of care is to monitor the patient until he is clinically sober.  The patient woke up saying that he hadn't seen the doctor and he was having neck pain. Reassured the patient that he received imaging studies that were normal. He reports that he still does have some neck pain so gave him some ibuprofen. After sometime the patient reports that he was ready to go home. He reports that he wants to sleep in his bed. The patient talks in full and sentences and has a steady gait. The patient has a history of alcohol abuse and has been seen many times with alcohol as above 300 in our emergency department. Now that he has been here for a few hours I feel that he is appropriately sober to be discharged home. The patient will be discharged to home to follow-up with his regular doctor. I will have the nurse place a Lidoderm patch on the patient's neck.        Loney Hering, MD 02/27/17 [phone]

## 2017-02-27 NOTE — ED Notes (Signed)
Patient asking, "why haven't you checked my back and my neck since I fell and it hurts." Explained to patient that we have done the appropriate testing for that. Patient does not remember testing or the doctor coming in to see him.

## 2017-02-27 NOTE — ED Notes (Signed)
Pt requesting to leave. Pt assisted up to walk with this rn without difficulty. Pt is drinking po fluids. Pt is alert to self, situation and date at this time. Pt speaking in clear sentences. Pt removed from oxygen to trend pox on ra.

## 2017-02-27 NOTE — ED Notes (Signed)
Report from [place], rn.

## 2017-02-28 ENCOUNTER — Emergency Department: Payer: Self-pay

## 2017-02-28 ENCOUNTER — Emergency Department
Admission: EM | Admit: 2017-02-28 | Discharge: 2017-03-01 | Disposition: A | Discharge status: Home / Self Care | Payer: Self-pay | Attending: Emergency Medicine | Admitting: Emergency Medicine

## 2017-02-28 ENCOUNTER — Encounter: Payer: Self-pay | Admitting: Emergency Medicine

## 2017-02-28 DIAGNOSIS — M542 Cervicalgia: Secondary | ICD-10-CM | POA: Insufficient documentation

## 2017-02-28 DIAGNOSIS — F10129 Alcohol abuse with intoxication, unspecified: Secondary | ICD-10-CM | POA: Insufficient documentation

## 2017-02-28 DIAGNOSIS — Y929 Unspecified place or not applicable: Secondary | ICD-10-CM | POA: Insufficient documentation

## 2017-02-28 DIAGNOSIS — I10 Essential (primary) hypertension: Secondary | ICD-10-CM | POA: Insufficient documentation

## 2017-02-28 DIAGNOSIS — W19XXXA Unspecified fall, initial encounter: Secondary | ICD-10-CM | POA: Insufficient documentation

## 2017-02-28 DIAGNOSIS — Y999 Unspecified external cause status: Secondary | ICD-10-CM | POA: Insufficient documentation

## 2017-02-28 DIAGNOSIS — J45909 Unspecified asthma, uncomplicated: Secondary | ICD-10-CM | POA: Insufficient documentation

## 2017-02-28 DIAGNOSIS — F10920 Alcohol use, unspecified with intoxication, uncomplicated: Secondary | ICD-10-CM

## 2017-02-28 DIAGNOSIS — R930 Abnormal findings on diagnostic imaging of skull and head, not elsewhere classified: Secondary | ICD-10-CM | POA: Insufficient documentation

## 2017-02-28 DIAGNOSIS — Z87891 Personal history of nicotine dependence: Secondary | ICD-10-CM | POA: Insufficient documentation

## 2017-02-28 DIAGNOSIS — Y939 Activity, unspecified: Secondary | ICD-10-CM | POA: Insufficient documentation

## 2017-02-28 DIAGNOSIS — Z79899 Other long term (current) drug therapy: Secondary | ICD-10-CM | POA: Insufficient documentation

## 2017-02-28 LAB — CBC
HCT: 39.1 % — ABNORMAL LOW (ref 40.0–52.0)
Hemoglobin: 13.1 g/dL (ref 13.0–18.0)
MCH: 31 pg (ref 26.0–34.0)
MCHC: 33.4 g/dL (ref 32.0–36.0)
MCV: 92.7 fL (ref 80.0–100.0)
Platelets: 295 10*3/uL (ref 150–440)
RBC: 4.22 MIL/uL — ABNORMAL LOW (ref 4.40–5.90)
RDW: 15.4 % — ABNORMAL HIGH (ref 11.5–14.5)
WBC: 5 10*3/uL (ref 3.8–10.6)

## 2017-02-28 LAB — BASIC METABOLIC PANEL
Anion gap: 12 (ref 5–15)
BUN: 5 mg/dL — ABNORMAL LOW (ref 6–20)
CO2: 27 mmol/L (ref 22–32)
Calcium: 8.8 mg/dL — ABNORMAL LOW (ref 8.9–10.3)
Chloride: 107 mmol/L (ref 101–111)
Creatinine, Ser: 0.88 mg/dL (ref 0.61–1.24)
GFR calc Af Amer: >60 mL/min (ref >60)
GFR calc non Af Amer: >60 mL/min (ref >60)
Glucose, Bld: 123 mg/dL — ABNORMAL HIGH (ref 65–99)
Potassium: 2.9 mmol/L — ABNORMAL LOW (ref 3.5–5.1)
Sodium: 146 mmol/L — ABNORMAL HIGH (ref 135–145)

## 2017-02-28 LAB — ETHANOL: Alcohol, Ethyl (B): 254 mg/dL — ABNORMAL HIGH (ref <5)

## 2017-02-28 MED ORDER — IBUPROFEN 600 MG PO TABS
600.0000 mg | ORAL_TABLET | Freq: Once | ORAL | Status: AC
  Administered 2017-02-28: 600 mg via ORAL

## 2017-02-28 MED ORDER — IBUPROFEN 600 MG PO TABS
ORAL_TABLET | ORAL | Status: AC
  Filled 2017-02-28: qty 1

## 2017-02-28 MED ORDER — ALUM & MAG HYDROXIDE-SIMETH 200-200-20 MG/5ML PO SUSP
ORAL | Status: AC
  Filled 2017-02-28: qty 30

## 2017-02-28 MED ORDER — ALUM & MAG HYDROXIDE-SIMETH 200-200-20 MG/5ML PO SUSP
30.0000 mL | Freq: Once | ORAL | Status: AC
  Administered 2017-02-28: 30 mL via ORAL

## 2017-02-28 NOTE — ED Notes (Signed)
Pt requesting "something for heartburn". md notified, order for maalox received.

## 2017-02-28 NOTE — ED Notes (Signed)
Pt provided with meal and po fluids per request. Additional tv remote provided to pt. Urinal provided to pt. Pt denies other needs.

## 2017-02-28 NOTE — ED Notes (Signed)
Explanation provided to pt regarding plan of care again after pt spoke with md.

## 2017-02-28 NOTE — ED Notes (Signed)
Pt with xray and CT

## 2017-02-28 NOTE — ED Notes (Signed)
Report from shannin, rn.  

## 2017-02-28 NOTE — ED Notes (Signed)
Pt requesting pain medication. md notified and in to reassess.

## 2017-02-28 NOTE — ED Provider Notes (Addendum)
[facility] Emergency Department Provider Note  Time seen: 8:34 PM  I have reviewed the triage vital signs and the nursing notes.   HISTORY  Chief Complaint Fall and Neck Pain    HPI Miguel [name] is a 56 y.o. male with a past medical history of alcoholism, gastric reflux, hypertension, presents to the emergency department after reported fall. According to the patient he was drinking alcohol tonight and he had a fall landing on his neck. States moderate neck pain. Denies any weakness or numbness. Patient is tearful throughout exam saying that he wants to stop drinking but he can't. Patient is also complaining of some shortness of breath. Patient was last seen in the emergency department yesterday and discharged home this morning after a similar presentation. Patient denies any chest pain. He is able to move all extremities. Calm, cooperative, appears intoxicated but in no distress.  Past Medical History:  Diagnosis Date  . Alcohol abuse   . Asthma   . GERD (gastroesophageal reflux disease)   . Gout   . Hypertension   . OCD (obsessive compulsive disorder)   . Renal colic     Patient Active Problem List   Diagnosis Date Noted  . Moderate major depression, single episode ([place]) 08/15/2016  . Substance induced mood disorder ([place]) 08/15/2016  . Self-inflicted injury 82/99/3716  . Involuntary commitment 08/15/2016  . Alcohol abuse 02/05/2016  . Hypertension 12/05/2015  . Tachycardia 12/05/2015  . Gout 11/13/2015  . Chronic back pain 05/02/2015    Past Surgical History:  Procedure Laterality Date  . CHOLECYSTECTOMY  2012    Prior to Admission medications   Medication Sig Start Date End Date Taking? Authorizing Provider  albuterol (VENTOLIN HFA) 108 (90 Base) MCG/ACT inhaler Inhale 2 puffs into the lungs every 4 (four) hours as needed for wheezing or shortness of breath.    Historical Provider, MD  allopurinol (ZYLOPRIM) 300 MG tablet Take 300 mg by mouth  every morning.    Historical Provider, MD  colchicine 0.6 MG tablet 1 tablet once or twice a day with food Patient not taking: Reported on 02/19/2017 04/30/16 05/31/16  Larene Beach A McGowan, PA-C  dexlansoprazole (DEXILANT) 60 MG capsule Take 60 mg by mouth every morning.    Historical Provider, MD  etodolac (LODINE) 200 MG capsule Take 1 capsule (200 mg total) by mouth every 8 (eight) hours. Patient not taking: Reported on 08/15/2016 07/27/16   Loney Hering, MD  etodolac (LODINE) 200 MG capsule Take 1 capsule (200 mg total) by mouth every 8 (eight) hours. Patient not taking: Reported on 02/19/2017 11/19/16   Loney Hering, MD  FLUoxetine (PROZAC) 20 MG capsule Take 1 capsule (20 mg total) by mouth daily. Patient not taking: Reported on 02/19/2017 08/15/16   Gonzella Lex, MD  Fluticasone-Salmeterol (ADVAIR) 100-50 MCG/DOSE AEPB Inhale 1 puff into the lungs 2 (two) times daily.    Historical Provider, MD  HYDROcodone-acetaminophen (NORCO/VICODIN) 5-325 MG tablet Take 1-2 tablets by mouth every 4 (four) hours as needed for moderate pain. Patient not taking: Reported on 08/15/2016 05/02/16   Hinda Kehr, MD  ibuprofen (ADVIL,MOTRIN) 600 MG tablet Take 1 tablet (600 mg total) by mouth every 6 (six) hours as needed. Patient not taking: Reported on 08/15/2016 12/31/15   Sable Feil, PA-C  ketorolac (TORADOL) 10 MG tablet Take 1 tablet (10 mg total) by mouth every 8 (eight) hours. Patient not taking: Reported on 08/15/2016 07/28/16   Melvenia Needles, PA-C  lidocaine (LIDODERM) 5 % Place 1 patch onto the skin every 12 (twelve) hours. Remove & Discard patch within 12 hours or as directed by MD Patient not taking: Reported on 02/19/2017 07/22/16 07/22/17  Nance Pear, MD  lidocaine (LIDODERM) 5 % Place 1 patch onto the skin every 12 (twelve) hours. Remove & Discard patch within 12 hours or as directed by MD 02/27/17 02/27/18  Loney Hering, MD  lisinopril (PRINIVIL,ZESTRIL) 20 MG tablet Take 20 mg by mouth  every morning.     Historical Provider, MD  LORazepam (ATIVAN) 1 MG tablet Take 1 tablet (1 mg total) by mouth 2 (two) times daily as needed for anxiety. Patient not taking: Reported on 04/30/2016 04/01/16   Daymon Larsen, MD  naproxen (EC NAPROSYN) 500 MG EC tablet Take 1 tablet (500 mg total) by mouth 2 (two) times daily with a meal. Patient not taking: Reported on 08/15/2016 02/13/16   Alita Chyle Bacon Menshew, PA-C  omeprazole (PRILOSEC) 20 MG capsule Take 20 mg by mouth daily.    Historical Provider, MD  ondansetron (ZOFRAN ODT) 4 MG disintegrating tablet Take 1 tablet (4 mg total) by mouth every 8 (eight) hours as needed for nausea or vomiting. Patient not taking: Reported on 02/19/2017 11/19/16   Loney Hering, MD  ondansetron (ZOFRAN) 4 MG tablet Take 1 tablet (4 mg total) by mouth every 8 (eight) hours as needed for nausea or vomiting. Patient not taking: Reported on 02/19/2017 01/26/17   Rudene Re, MD  oxyCODONE-acetaminophen (ROXICET) 5-325 MG tablet Take 1 tablet by mouth every 6 (six) hours as needed. Patient not taking: Reported on 08/15/2016 07/27/16   Loney Hering, MD  potassium chloride (K-DUR) 10 MEQ tablet Take 1 tablet (10 mEq total) by mouth daily. Patient not taking: Reported on 08/15/2016 04/30/16   Larene Beach A McGowan, PA-C    No Known Allergies  History reviewed. No pertinent family history.  Social History Social History  Substance Use Topics  . Smoking status: Former Research scientist (life sciences)  . Smokeless tobacco: Never Used  . Alcohol use Yes     Comment: 1 pint of vodka a day    Review of Systems Constitutional: Negative for fever. Cardiovascular: Negative for chest pain. Respiratory: Mild shortness of breath times weeks Gastrointestinal: Negative for abdominal pain Musculoskeletal: Positive for neck pain Neurological: Negative for headaches, focal weakness or numbness. 10-point ROS otherwise negative.  ____________________________________________   PHYSICAL  EXAM:  VITAL SIGNS: ED Triage Vitals  Enc Vitals Group     BP 02/28/17 2028 137/89     Pulse Rate 02/28/17 2028 99     Resp 02/28/17 2028 19     Temp 02/28/17 2028 97.8 F (36.6 C)     Temp Source 02/28/17 2028 Oral     SpO2 02/28/17 2028 100 %     Weight 02/28/17 2029 200 lb (90.7 kg)     Height 02/28/17 2029 6' (1.829 m)     Head Circumference --      Peak Flow --      Pain Score 02/28/17 2028 6     Pain Loc --      Pain Edu? --      Excl. in [place]? --     Constitutional: Alert, appears intoxicated, tearful at times. Admits alcohol use. No distress. Able to follow commands and answer questions fairly appropriately. Eyes: Normal exam ENT   Head: Normocephalic and atraumatic   Mouth/Throat: Mucous membranes are moist. Cardiovascular: Normal rate, regular rhythm. No  murmur Respiratory: Normal respiratory effort without tachypnea nor retractions. Breath sounds are clear and equal bilaterally. . Gastrointestinal: Soft and nontender. No distention.   Musculoskeletal: Nontender with normal range of motion in all extremities. Neurologic:  Normal speech and language. No gross focal neurologic deficits Skin:  Skin is warm, dry and intact.  Psychiatric: Mood and affect are normal.   ____________________________________________     RADIOLOGY  IMPRESSION: 1. No acute intracranial abnormality. No skull fracture is noted. Mild cerebral atrophy is stable. 2. Paranasal sinuses disease as described above. 3. No cervical spine acute fracture or subluxation. Mild degenerative changes as described above.  ____________________________________________   INITIAL IMPRESSION / ASSESSMENT AND PLAN / ED COURSE  Pertinent labs & imaging results that were available during my care of the patient were reviewed by me and considered in my medical decision making (see chart for details).  Patient with a past medical history.always presents to the emergency department after a reported fall  after drinking alcohol tonight. We'll check labs including an ethanol level. Given the patient's reported new fall tonight we'll obtain a CT scan of the head and neck to rule out injury. Patient is also stating some mild shortness of breath for the past several weeks we'll obtain a chest x-ray. I discussed with the patient this cycle of him coming to the emergency department intoxicated after a fall and then leaving and once again becoming intoxicated. I discussed with the patient that he needs to go through detox process. He is agreeable to detox at this time however in the past once the patient has sobered he refuses detox. We will monitor the patient in the emergency department, once clinically sober if the patient still wishes to detox we will discuss with TTS otherwise the patient would be discharged if he medical workup is nonrevealing.  Patient's labs show an elevated alcohol level of 254 otherwise largely at baseline. CT scans are negative. We will continue to monitor the patient in the emergency department while he sobers.  Patient now saying he is not interested in detox and wishes to go home. He has no one to come pick him up from the emergency department but states he will take a taxi cab. As the patient's alcohol level was elevated to 254 discussed with the patient watching him in the emergency department until 1 AM patient is agreeable to this plan.  ____________________________________________   FINAL CLINICAL IMPRESSION(S) / ED DIAGNOSES  Fall Alcohol intoxication    Harvest Dark, MD 02/28/17 [id number]    Harvest Dark, MD 02/28/17 2332

## 2017-02-28 NOTE — ED Triage Notes (Signed)
Pt presents to ED via EMS c/o fall r/t taking lisinopril and wine . Pt states that after he was drinking alcohol , he fell 2x. Now c/o neck pain

## 2017-03-01 NOTE — ED Notes (Signed)
Pt awake and alert. Pt ambulatory with steady gait. Per dr. Kerman Passey as discussed at 2340, pt may be discharged via taxi at 0100 if he has a steady gait. Pt requesting taxi voucher. Pt states he has money but does not want to spend it on a taxi. Spoke with charge rn dawn who declines to provide pt with taxi voucher at this time.

## 2017-03-01 NOTE — ED Notes (Signed)
Spoke with dr. Kerman Passey regarding pt's 2.9 potassium. No new orders received.

## 2017-03-02 ENCOUNTER — Emergency Department
Admission: EM | Admit: 2017-03-02 | Discharge: 2017-03-02 | Disposition: A | Discharge status: Home / Self Care | Payer: Self-pay | Attending: Emergency Medicine | Admitting: Emergency Medicine

## 2017-03-02 DIAGNOSIS — Z87891 Personal history of nicotine dependence: Secondary | ICD-10-CM | POA: Insufficient documentation

## 2017-03-02 DIAGNOSIS — F1010 Alcohol abuse, uncomplicated: Secondary | ICD-10-CM | POA: Insufficient documentation

## 2017-03-02 DIAGNOSIS — Z79899 Other long term (current) drug therapy: Secondary | ICD-10-CM | POA: Insufficient documentation

## 2017-03-02 DIAGNOSIS — J45909 Unspecified asthma, uncomplicated: Secondary | ICD-10-CM | POA: Insufficient documentation

## 2017-03-02 DIAGNOSIS — I10 Essential (primary) hypertension: Secondary | ICD-10-CM | POA: Insufficient documentation

## 2017-03-02 LAB — URINE DRUG SCREEN, QUALITATIVE (ARMC ONLY)
Amphetamines, Ur Screen: NOT DETECTED
Barbiturates, Ur Screen: NOT DETECTED
Benzodiazepine, Ur Scrn: POSITIVE — AB
Cannabinoid 50 Ng, Ur ~~LOC~~: NOT DETECTED
Cocaine Metabolite,Ur ~~LOC~~: NOT DETECTED
MDMA (Ecstasy)Ur Screen: NOT DETECTED
Methadone Scn, Ur: NOT DETECTED
Opiate, Ur Screen: NOT DETECTED
Phencyclidine (PCP) Ur S: NOT DETECTED
Tricyclic, Ur Screen: NOT DETECTED

## 2017-03-02 LAB — COMPREHENSIVE METABOLIC PANEL
ALT: 21 U/L (ref 17–63)
AST: 23 U/L (ref 15–41)
Albumin: 3.6 g/dL (ref 3.5–5.0)
Alkaline Phosphatase: 70 U/L (ref 38–126)
Anion gap: 10 (ref 5–15)
BUN: 10 mg/dL (ref 6–20)
CO2: 25 mmol/L (ref 22–32)
Calcium: 8.5 mg/dL — ABNORMAL LOW (ref 8.9–10.3)
Chloride: 105 mmol/L (ref 101–111)
Creatinine, Ser: 0.93 mg/dL (ref 0.61–1.24)
GFR calc Af Amer: >60 mL/min (ref >60)
GFR calc non Af Amer: >60 mL/min (ref >60)
Glucose, Bld: 138 mg/dL — ABNORMAL HIGH (ref 65–99)
Potassium: 2.7 mmol/L — CL (ref 3.5–5.1)
Sodium: 140 mmol/L (ref 135–145)
Total Bilirubin: 0.4 mg/dL (ref 0.3–1.2)
Total Protein: 6.4 g/dL — ABNORMAL LOW (ref 6.5–8.1)

## 2017-03-02 LAB — CBC
HCT: 39 % — ABNORMAL LOW (ref 40.0–52.0)
Hemoglobin: 13.2 g/dL (ref 13.0–18.0)
MCH: 30.9 pg (ref 26.0–34.0)
MCHC: 34 g/dL (ref 32.0–36.0)
MCV: 91 fL (ref 80.0–100.0)
Platelets: 285 10*3/uL (ref 150–440)
RBC: 4.28 MIL/uL — ABNORMAL LOW (ref 4.40–5.90)
RDW: 15.7 % — ABNORMAL HIGH (ref 11.5–14.5)
WBC: 8.1 10*3/uL (ref 3.8–10.6)

## 2017-03-02 LAB — ETHANOL: Alcohol, Ethyl (B): 356 mg/dL (ref <5)

## 2017-03-02 MED ORDER — POTASSIUM CHLORIDE CRYS ER 20 MEQ PO TBCR
40.0000 meq | EXTENDED_RELEASE_TABLET | Freq: Once | ORAL | Status: AC
  Administered 2017-03-02: 40 meq via ORAL
  Filled 2017-03-02: qty 2

## 2017-03-02 NOTE — ED Provider Notes (Signed)
[facility] Emergency Department Provider Note   ____________________________________________   I have reviewed the triage vital signs and the nursing notes.   HISTORY  Chief Complaint "I want to go home"  History limited by: Not Limited   HPI Miguel [name] is a 56 y.o. male who presents to the emergency department today are unclear reasons. Patient is well-known to the emergency department is seen frequently for intoxication. Does appear the patient has been drinking again however by the time of my examination he stated he wanted to go home. States he wants to be home with his wife.   Past Medical History:  Diagnosis Date  . Alcohol abuse   . Asthma   . GERD (gastroesophageal reflux disease)   . Gout   . Hypertension   . OCD (obsessive compulsive disorder)   . Renal colic     Patient Active Problem List   Diagnosis Date Noted  . Moderate major depression, single episode ([place]) 08/15/2016  . Substance induced mood disorder ([place]) 08/15/2016  . Self-inflicted injury 81/11/7508  . Involuntary commitment 08/15/2016  . Alcohol abuse 02/05/2016  . Hypertension 12/05/2015  . Tachycardia 12/05/2015  . Gout 11/13/2015  . Chronic back pain 05/02/2015    Past Surgical History:  Procedure Laterality Date  . CHOLECYSTECTOMY  2012    Prior to Admission medications   Medication Sig Start Date End Date Taking? Authorizing Provider  albuterol (VENTOLIN HFA) 108 (90 Base) MCG/ACT inhaler Inhale 2 puffs into the lungs every 4 (four) hours as needed for wheezing or shortness of breath.    Historical Provider, MD  allopurinol (ZYLOPRIM) 300 MG tablet Take 300 mg by mouth every morning.    Historical Provider, MD  colchicine 0.6 MG tablet 1 tablet once or twice a day with food Patient not taking: Reported on 02/19/2017 04/30/16 05/31/16  Larene Beach A McGowan, PA-C  dexlansoprazole (DEXILANT) 60 MG capsule Take 60 mg by mouth every morning.    Historical Provider, MD   etodolac (LODINE) 200 MG capsule Take 1 capsule (200 mg total) by mouth every 8 (eight) hours. Patient not taking: Reported on 08/15/2016 07/27/16   Loney Hering, MD  etodolac (LODINE) 200 MG capsule Take 1 capsule (200 mg total) by mouth every 8 (eight) hours. Patient not taking: Reported on 02/19/2017 11/19/16   Loney Hering, MD  FLUoxetine (PROZAC) 20 MG capsule Take 1 capsule (20 mg total) by mouth daily. Patient not taking: Reported on 02/19/2017 08/15/16   Gonzella Lex, MD  Fluticasone-Salmeterol (ADVAIR) 100-50 MCG/DOSE AEPB Inhale 1 puff into the lungs 2 (two) times daily.    Historical Provider, MD  HYDROcodone-acetaminophen (NORCO/VICODIN) 5-325 MG tablet Take 1-2 tablets by mouth every 4 (four) hours as needed for moderate pain. Patient not taking: Reported on 08/15/2016 05/02/16   Hinda Kehr, MD  ibuprofen (ADVIL,MOTRIN) 600 MG tablet Take 1 tablet (600 mg total) by mouth every 6 (six) hours as needed. Patient not taking: Reported on 08/15/2016 12/31/15   Sable Feil, PA-C  ketorolac (TORADOL) 10 MG tablet Take 1 tablet (10 mg total) by mouth every 8 (eight) hours. Patient not taking: Reported on 08/15/2016 07/28/16   Dannielle Karvonen Menshew, PA-C  lidocaine (LIDODERM) 5 % Place 1 patch onto the skin every 12 (twelve) hours. Remove & Discard patch within 12 hours or as directed by MD Patient not taking: Reported on 02/19/2017 07/22/16 07/22/17  Nance Pear, MD  lidocaine (LIDODERM) 5 % Place 1 patch onto  the skin every 12 (twelve) hours. Remove & Discard patch within 12 hours or as directed by MD 02/27/17 02/27/18  Loney Hering, MD  lisinopril (PRINIVIL,ZESTRIL) 20 MG tablet Take 20 mg by mouth every morning.     Historical Provider, MD  LORazepam (ATIVAN) 1 MG tablet Take 1 tablet (1 mg total) by mouth 2 (two) times daily as needed for anxiety. Patient not taking: Reported on 04/30/2016 04/01/16   Daymon Larsen, MD  naproxen (EC NAPROSYN) 500 MG EC tablet Take 1 tablet (500 mg  total) by mouth 2 (two) times daily with a meal. Patient not taking: Reported on 08/15/2016 02/13/16   Alita Chyle Bacon Menshew, PA-C  omeprazole (PRILOSEC) 20 MG capsule Take 20 mg by mouth daily.    Historical Provider, MD  ondansetron (ZOFRAN ODT) 4 MG disintegrating tablet Take 1 tablet (4 mg total) by mouth every 8 (eight) hours as needed for nausea or vomiting. Patient not taking: Reported on 02/19/2017 11/19/16   Loney Hering, MD  ondansetron (ZOFRAN) 4 MG tablet Take 1 tablet (4 mg total) by mouth every 8 (eight) hours as needed for nausea or vomiting. Patient not taking: Reported on 02/19/2017 01/26/17   Rudene Re, MD  oxyCODONE-acetaminophen (ROXICET) 5-325 MG tablet Take 1 tablet by mouth every 6 (six) hours as needed. Patient not taking: Reported on 08/15/2016 07/27/16   Loney Hering, MD  potassium chloride (K-DUR) 10 MEQ tablet Take 1 tablet (10 mEq total) by mouth daily. Patient not taking: Reported on 08/15/2016 04/30/16   Nori Riis, PA-C    Allergies Patient has no known allergies.  No family history on file.  Social History Social History  Substance Use Topics  . Smoking status: Former Research scientist (life sciences)  . Smokeless tobacco: Never Used  . Alcohol use Yes     Comment: 1 pint of vodka a day    Review of Systems  Constitutional: Negative for fever. Cardiovascular: Negative for chest pain. Respiratory: Negative for shortness of breath. Gastrointestinal: Negative for abdominal pain, vomiting and diarrhea. Neurological: Negative for headaches, focal weakness or numbness.  10-point ROS otherwise negative.  ____________________________________________   PHYSICAL EXAM:  VITAL SIGNS: ED Triage Vitals  Enc Vitals Group     BP 03/02/17 2051 (!) 163/98     Pulse Rate 03/02/17 2051 93     Resp 03/02/17 2051 20     Temp --      Temp src --      SpO2 03/02/17 2051 99 %     Weight 03/02/17 2052 200 lb (90.7 kg)     Height 03/02/17 2052 6' (1.829 m)     Head  Circumference --      Peak Flow --      Pain Score 03/02/17 2051 5   Constitutional: Alert and oriented. Well appearing and in no distress. Eyes: Conjunctivae are normal. Normal extraocular movements. ENT   Head: Normocephalic and atraumatic.   Nose: No congestion/rhinnorhea.   Mouth/Throat: Mucous membranes are moist.   Neck: No stridor. Hematological/Lymphatic/Immunilogical: No cervical lymphadenopathy. Cardiovascular: Normal rate, regular rhythm.  No murmurs, rubs, or gallops.  Respiratory: Normal respiratory effort without tachypnea nor retractions. Breath sounds are clear and equal bilaterally. No wheezes/rales/rhonchi. Gastrointestinal: Soft and non tender. No rebound. No guarding.  Genitourinary: Deferred Musculoskeletal: Normal range of motion in all extremities. No lower extremity edema. Neurologic:  Normal speech and language. No gross focal neurologic deficits are appreciated.  Skin:  Skin is warm, dry and intact.  No rash noted. Psychiatric: Mood and affect are normal. Speech and behavior are normal. Patient exhibits appropriate insight and judgment.  ____________________________________________    LABS (pertinent positives/negatives)  Labs Reviewed  COMPREHENSIVE METABOLIC PANEL - Abnormal; Notable for the following:       Result Value   Potassium 2.7 (*)    Glucose, Bld 138 (*)    Calcium 8.5 (*)    Total Protein 6.4 (*)    All other components within normal limits  ETHANOL - Abnormal; Notable for the following:    Alcohol, Ethyl (B) 356 (*)    All other components within normal limits  CBC - Abnormal; Notable for the following:    RBC 4.28 (*)    HCT 39.0 (*)    RDW 15.7 (*)    All other components within normal limits  URINE DRUG SCREEN, QUALITATIVE ([facility] ONLY) - Abnormal; Notable for the following:    Benzodiazepine, Ur Scrn POSITIVE (*)    All other components within normal limits      ____________________________________________   EKG  I, Nance Pear, attending physician, personally viewed and interpreted this EKG  EKG Time: 2055 Rate: 93 Rhythm: sinus rhythm Axis: normal Intervals: no av block QRS: narrow, q waves V1, III ST changes: no st elevation Impression: abnormal ekg  ____________________________________________    RADIOLOGY  None   ____________________________________________   PROCEDURES  Procedures  ____________________________________________   INITIAL IMPRESSION / ASSESSMENT AND PLAN / ED COURSE  Pertinent labs & imaging results that were available during my care of the patient were reviewed by me and considered in my medical decision making (see chart for details).  Patient presented to the emergency department today for unclear reasons. When asked why he was here he stated he wanted go home. Patient did appear somewhat intoxicated. Patient is well-known to the emergency department. He was observed for a brief amount of time in the emergency department was able to ambulate without assistance. Given that patient wanted to be discharged, and that he was in his normal state of health, patient was discharged home.  ____________________________________________   FINAL CLINICAL IMPRESSION(S) / ED DIAGNOSES  Final diagnoses:  Alcohol abuse     Note: This dictation was prepared with Dragon dictation. Any transcriptional errors that result from this process are unintentional     Nance Pear, MD 03/02/17 [phone]

## 2017-03-02 NOTE — ED Notes (Addendum)
Pt bib EMS from home.  Per EMS, pt called 911 but hung up so EMS truck was sent out for unknown issue. Per EMS pt presents as he does know, intoxicated, slurring words, generally upset.  Pt has been seen at [facility] ED for similar issue, pt presents as he has in past.  Pt sts that he drank "some wine" today after visiting wife.  Pt upset, not claiming SI/HI at this time but voicing thoughts that he's tired and "done".  NAD.

## 2017-03-02 NOTE — ED Notes (Signed)
[facility] ED tech ambulated the Pt and the Pt had no difficulty walking. Dr. Archie Balboa is recommending that the Pt goes back home via taxi. Pt has agreed to go home on a taxi.

## 2017-03-02 NOTE — Discharge Instructions (Signed)
Please seek medical attention for any high fevers, chest pain, shortness of breath, change in behavior, persistent vomiting, bloody stool or any other new or concerning symptoms.  

## 2017-03-06 ENCOUNTER — Encounter: Payer: Self-pay | Admitting: Emergency Medicine

## 2017-03-06 ENCOUNTER — Emergency Department
Admission: EM | Admit: 2017-03-06 | Discharge: 2017-03-06 | Disposition: A | Discharge status: Home / Self Care | Payer: No Typology Code available for payment source | Attending: Emergency Medicine | Admitting: Emergency Medicine

## 2017-03-06 DIAGNOSIS — Z87891 Personal history of nicotine dependence: Secondary | ICD-10-CM | POA: Insufficient documentation

## 2017-03-06 DIAGNOSIS — W19XXXA Unspecified fall, initial encounter: Secondary | ICD-10-CM | POA: Insufficient documentation

## 2017-03-06 DIAGNOSIS — I10 Essential (primary) hypertension: Secondary | ICD-10-CM | POA: Insufficient documentation

## 2017-03-06 DIAGNOSIS — F10920 Alcohol use, unspecified with intoxication, uncomplicated: Secondary | ICD-10-CM

## 2017-03-06 DIAGNOSIS — Y939 Activity, unspecified: Secondary | ICD-10-CM | POA: Insufficient documentation

## 2017-03-06 DIAGNOSIS — Y999 Unspecified external cause status: Secondary | ICD-10-CM | POA: Insufficient documentation

## 2017-03-06 DIAGNOSIS — J45909 Unspecified asthma, uncomplicated: Secondary | ICD-10-CM | POA: Insufficient documentation

## 2017-03-06 DIAGNOSIS — F329 Major depressive disorder, single episode, unspecified: Secondary | ICD-10-CM | POA: Insufficient documentation

## 2017-03-06 DIAGNOSIS — F10120 Alcohol abuse with intoxication, uncomplicated: Secondary | ICD-10-CM | POA: Insufficient documentation

## 2017-03-06 DIAGNOSIS — Y929 Unspecified place or not applicable: Secondary | ICD-10-CM | POA: Insufficient documentation

## 2017-03-06 DIAGNOSIS — Z79899 Other long term (current) drug therapy: Secondary | ICD-10-CM | POA: Insufficient documentation

## 2017-03-06 LAB — URINE DRUG SCREEN, QUALITATIVE (ARMC ONLY)
Amphetamines, Ur Screen: NOT DETECTED
Barbiturates, Ur Screen: NOT DETECTED
Benzodiazepine, Ur Scrn: POSITIVE — AB
Cannabinoid 50 Ng, Ur ~~LOC~~: NOT DETECTED
Cocaine Metabolite,Ur ~~LOC~~: NOT DETECTED
MDMA (Ecstasy)Ur Screen: NOT DETECTED
Methadone Scn, Ur: NOT DETECTED
Opiate, Ur Screen: NOT DETECTED
Phencyclidine (PCP) Ur S: NOT DETECTED
Tricyclic, Ur Screen: NOT DETECTED

## 2017-03-06 NOTE — ED Notes (Signed)

## 2017-03-06 NOTE — ED Provider Notes (Signed)
[facility] Emergency Department Provider Note       Time seen: 03/06/2017    I have reviewed the triage vital signs and the nursing notes.   HISTORY   Chief Complaint No chief complaint on file.    HPI Miguel [name] is a 56 y.o. male who presents to the ED being brought by EMS after a fall. Patient has a known history of chronic alcoholism. He presents tearful and crying about his wife and her medical problems. Patient states he did not want to come, but was encouraged to do so by EMS   Past Medical History:  Diagnosis Date  . Alcohol abuse   . Asthma   . GERD (gastroesophageal reflux disease)   . Gout   . Hypertension   . OCD (obsessive compulsive disorder)   . Renal colic     Patient Active Problem List   Diagnosis Date Noted  . Moderate major depression, single episode ([place]) 08/15/2016  . Substance induced mood disorder ([place]) 08/15/2016  . Self-inflicted injury 08/65/7846  . Involuntary commitment 08/15/2016  . Alcohol abuse 02/05/2016  . Hypertension 12/05/2015  . Tachycardia 12/05/2015  . Gout 11/13/2015  . Chronic back pain 05/02/2015    Past Surgical History:  Procedure Laterality Date  . CHOLECYSTECTOMY  2012    Allergies Patient has no known allergies.  Social History Social History  Substance Use Topics  . Smoking status: Former Research scientist (life sciences)  . Smokeless tobacco: Never Used  . Alcohol use Yes     Comment: 1 pint of vodka a day   Review of Systems Constitutional: Negative for fever. Cardiovascular: Negative for chest pain. Respiratory: Negative for shortness of breath. Gastrointestinal: Negative for abdominal pain, vomiting and diarrhea. Genitourinary: Negative for dysuria. Musculoskeletal: Negative for back pain. Skin: Negative for rash. Neurological: Negative for headaches, focal weakness or numbness.   10-point ROS otherwise negative.  ____________________________________________   PHYSICAL EXAM:  VITAL  SIGNS: ED Triage Vitals  Enc Vitals Group     BP      Pulse      Resp      Temp      Temp src      SpO2      Weight      Height      Head Circumference      Peak Flow      Pain Score      Pain Loc      Pain Edu?      Excl. in [place]?    Constitutional: Alert but confused, tearful Eyes: Conjunctivae are normal. PERRL. Normal extraocular movements. ENT   Head: Normocephalic and atraumatic.   Nose: No congestion/rhinnorhea.   Mouth/Throat: Mucous membranes are moist.   Neck: No stridor. Cardiovascular: Normal rate, regular rhythm. No murmurs, rubs, or gallops. Respiratory: Normal respiratory effort without tachypnea nor retractions. Breath sounds are clear and equal bilaterally. No wheezes/rales/rhonchi. Gastrointestinal: Soft and nontender. Normal bowel sounds Musculoskeletal: Nontender with normal range of motion in extremities. No lower extremity tenderness nor edema. Neurologic:  Normal speech and language. No gross focal neurologic deficits are appreciated.  Skin:  Skin is warm, dry and intact. No rash noted. Psychiatric: Depressed mood and affect, patient appears intoxicated.   ____________________________________________  ED COURSE:  Pertinent labs & imaging results that were available during my care of the patient were reviewed by me and considered in my medical decision making (see chart for details). Patient presents for alcohol intoxication with recent fall, he  has no signs of trauma. We will likely observe and discharge if stable   Procedures ____________________________________________   LABS (pertinent positives/negatives)  Labs Reviewed  URINE DRUG SCREEN, QUALITATIVE ([place])    ____________________________________________  FINAL ASSESSMENT AND PLAN  Alcohol intoxication, fall  Plan: Patient had presented for alcohol intoxication and has a chronic history of same. Patient states he does not want to be here and wants to call a cab to go  home.    Earleen Newport, MD   Note: This note was generated in part or whole with voice recognition software. Voice recognition is usually quite accurate but there are transcription errors that can and very often do occur. I apologize for any typographical errors that were not detected and corrected.     Earleen Newport, MD 03/06/17 [phone]

## 2017-03-06 NOTE — ED Triage Notes (Signed)
Patient arrives via EMS from home and appears distressed and is crying.  Patient is cooperative but slow to comply.  He has a wife he says is at [facility] and she has MS and has been hospitalized for a couple of weeks.  He also says he and his wife together only have $300 per month.  He is expressing remorse about coming here because he cannot afford the $20 cab ride home.

## 2017-03-06 NOTE — ED Notes (Signed)
Patient is independently ambulatory to bathroom and back to his bed.

## 2017-03-06 NOTE — ED Notes (Signed)
BEHAVIORAL HEALTH ROUNDING Patient sleeping: No Patient alert and oriented: Yes Behavior appropriate: Yes, but pt tearful about wife's condition Describe behavior: No inappropriate or unacceptable behaviors noted at this time.  Nutrition and fluids offered: Yes Toileting and hygiene offered: Yes Sitter present: Behavioral tech rounding every 15 minutes on patient to ensure safety.  Law enforcement present: Yes Event organiser agency: [place] ([employer])

## 2017-03-07 ENCOUNTER — Encounter: Payer: Self-pay | Admitting: Emergency Medicine

## 2017-03-07 ENCOUNTER — Emergency Department
Admission: EM | Admit: 2017-03-07 | Discharge: 2017-03-08 | Disposition: A | Discharge status: Home / Self Care | Payer: Self-pay | Attending: Emergency Medicine | Admitting: Emergency Medicine

## 2017-03-07 ENCOUNTER — Emergency Department
Admission: EM | Admit: 2017-03-07 | Discharge: 2017-03-07 | Disposition: A | Discharge status: Home / Self Care | Payer: No Typology Code available for payment source | Attending: Emergency Medicine | Admitting: Emergency Medicine

## 2017-03-07 ENCOUNTER — Emergency Department: Payer: Self-pay

## 2017-03-07 ENCOUNTER — Emergency Department: Payer: No Typology Code available for payment source

## 2017-03-07 DIAGNOSIS — M79604 Pain in right leg: Secondary | ICD-10-CM

## 2017-03-07 DIAGNOSIS — W19XXXA Unspecified fall, initial encounter: Secondary | ICD-10-CM

## 2017-03-07 DIAGNOSIS — F1020 Alcohol dependence, uncomplicated: Secondary | ICD-10-CM | POA: Diagnosis present

## 2017-03-07 DIAGNOSIS — F10220 Alcohol dependence with intoxication, uncomplicated: Secondary | ICD-10-CM | POA: Diagnosis present

## 2017-03-07 DIAGNOSIS — Y929 Unspecified place or not applicable: Secondary | ICD-10-CM | POA: Insufficient documentation

## 2017-03-07 DIAGNOSIS — IMO0002 Reserved for concepts with insufficient information to code with codable children: Secondary | ICD-10-CM

## 2017-03-07 DIAGNOSIS — Z87891 Personal history of nicotine dependence: Secondary | ICD-10-CM | POA: Insufficient documentation

## 2017-03-07 DIAGNOSIS — S6391XA Sprain of unspecified part of right wrist and hand, initial encounter: Secondary | ICD-10-CM | POA: Insufficient documentation

## 2017-03-07 DIAGNOSIS — Y999 Unspecified external cause status: Secondary | ICD-10-CM | POA: Insufficient documentation

## 2017-03-07 DIAGNOSIS — Y92000 Kitchen of unspecified non-institutional (private) residence as  the place of occurrence of the external cause: Secondary | ICD-10-CM | POA: Insufficient documentation

## 2017-03-07 DIAGNOSIS — Y9389 Activity, other specified: Secondary | ICD-10-CM | POA: Insufficient documentation

## 2017-03-07 DIAGNOSIS — I10 Essential (primary) hypertension: Secondary | ICD-10-CM | POA: Insufficient documentation

## 2017-03-07 DIAGNOSIS — X781XXA Intentional self-harm by knife, initial encounter: Secondary | ICD-10-CM | POA: Insufficient documentation

## 2017-03-07 DIAGNOSIS — Z7289 Other problems related to lifestyle: Secondary | ICD-10-CM

## 2017-03-07 DIAGNOSIS — S81811A Laceration without foreign body, right lower leg, initial encounter: Secondary | ICD-10-CM | POA: Insufficient documentation

## 2017-03-07 DIAGNOSIS — Z79899 Other long term (current) drug therapy: Secondary | ICD-10-CM | POA: Insufficient documentation

## 2017-03-07 DIAGNOSIS — W01118A Fall on same level from slipping, tripping and stumbling with subsequent striking against other sharp object, initial encounter: Secondary | ICD-10-CM | POA: Insufficient documentation

## 2017-03-07 DIAGNOSIS — S51811A Laceration without foreign body of right forearm, initial encounter: Secondary | ICD-10-CM | POA: Insufficient documentation

## 2017-03-07 DIAGNOSIS — J45909 Unspecified asthma, uncomplicated: Secondary | ICD-10-CM | POA: Insufficient documentation

## 2017-03-07 DIAGNOSIS — Y9301 Activity, walking, marching and hiking: Secondary | ICD-10-CM | POA: Insufficient documentation

## 2017-03-07 DIAGNOSIS — F1994 Other psychoactive substance use, unspecified with psychoactive substance-induced mood disorder: Secondary | ICD-10-CM | POA: Diagnosis present

## 2017-03-07 DIAGNOSIS — S81819A Laceration without foreign body, unspecified lower leg, initial encounter: Secondary | ICD-10-CM

## 2017-03-07 MED ORDER — LIDOCAINE HCL (PF) 1 % IJ SOLN
INTRAMUSCULAR | Status: AC
  Filled 2017-03-07: qty 10

## 2017-03-07 MED ORDER — OXYCODONE-ACETAMINOPHEN 5-325 MG PO TABS: 2.0000 | ORAL_TABLET | Freq: Four times a day (QID) | ORAL | 0 refills | Status: DC | PRN

## 2017-03-07 MED ORDER — VITAMIN B-1 100 MG PO TABS
100.0000 mg | ORAL_TABLET | Freq: Every day | ORAL | Status: DC
  Administered 2017-03-07 – 2017-03-08 (×2): 100 mg via ORAL
  Filled 2017-03-07: qty 1

## 2017-03-07 MED ORDER — LORAZEPAM 2 MG PO TABS
0.0000 mg | ORAL_TABLET | Freq: Four times a day (QID) | ORAL | Status: DC
  Administered 2017-03-07: 1 mg via ORAL
  Administered 2017-03-08: 2 mg via ORAL
  Filled 2017-03-07: qty 1

## 2017-03-07 MED ORDER — THIAMINE HCL 100 MG/ML IJ SOLN: 100.0000 mg | Freq: Every day | INTRAMUSCULAR | Status: DC

## 2017-03-07 MED ORDER — SODIUM CHLORIDE 0.9 % IV BOLUS (SEPSIS)
1000.0000 mL | Freq: Once | INTRAVENOUS | Status: AC
  Administered 2017-03-07: 1000 mL via INTRAVENOUS

## 2017-03-07 MED ORDER — LIDOCAINE HCL (PF) 1 % IJ SOLN
2.0000 mL | Freq: Once | INTRAMUSCULAR | Status: AC
  Administered 2017-03-07: 2 mL
  Filled 2017-03-07: qty 5

## 2017-03-07 MED ORDER — LORAZEPAM 2 MG PO TABS: 0.0000 mg | ORAL_TABLET | Freq: Two times a day (BID) | ORAL | Status: DC

## 2017-03-07 MED ORDER — LORAZEPAM 1 MG PO TABS
ORAL_TABLET | ORAL | Status: AC
  Filled 2017-03-07: qty 1

## 2017-03-07 MED ORDER — LIDOCAINE HCL (PF) 1 % IJ SOLN
10.0000 mL | Freq: Once | INTRAMUSCULAR | Status: AC
  Administered 2017-03-07: 10 mL via INTRADERMAL

## 2017-03-07 MED ORDER — VITAMIN B-1 100 MG PO TABS
ORAL_TABLET | ORAL | Status: AC
  Filled 2017-03-07: qty 1

## 2017-03-07 MED ORDER — IOPAMIDOL (ISOVUE-370) INJECTION 76%
125.0000 mL | Freq: Once | INTRAVENOUS | Status: AC | PRN
  Administered 2017-03-07: 125 mL via INTRAVENOUS

## 2017-03-07 NOTE — ED Notes (Signed)
Pt given taxi voucher and taken to lobby by RN. Pt in NAD and able to ambulate independently without falling or stumbling.

## 2017-03-07 NOTE — BH Assessment (Signed)
Writer was unable to complete TTS Consult. Patient was discharge before writer was able to see him.

## 2017-03-07 NOTE — ED Triage Notes (Signed)
Pt states that he fell on a knife at home while exiting the back floor and then proceeded to fall down the steps. Pt denies head trauma or LOC. Pt has 9 cm laceration to right leg. Pt id in NAD at this time.

## 2017-03-07 NOTE — Discharge Instructions (Signed)
Please follow-up with primary care provider or walking, in 7 days for suture removal. Return to the ER sooner for any increase in pain, swelling, warmth, redness, drainage. Return to the ER for any worsening symptoms urgent changes in her health.

## 2017-03-07 NOTE — ED Provider Notes (Signed)
[facility] Emergency Department Provider Note  ____________________________________________  Time seen: Approximately 10:13 PM  I have reviewed the triage vital signs and the nursing notes.   HISTORY  Chief Complaint Extremity Laceration    HPI Miguel [name] is a 56 y.o. male with an extensive history of alcoholism presents with right leg pain after a self-inflicted laceration. He reports that his wife is due to be discharged from rehabilitation herself tomorrow, and when he spoke with her on the phone about this tonight he got very upset. He feels very stressed and worried about having to get up at 6:00 in the morning to go be there for her last day and pick her up, so instead he used a knife to cut his right lower leg in linear fashion. States that he feels like he stabbed inward with a knife despite creating a long straight cut area denies any numbness tingling or weakness distally in the ankle or foot. Tetanus is up-to-date from 2 years ago.  Denies suicidal ideation or HI or hallucinations. Last alcohol was earlier today.     Past Medical History:  Diagnosis Date  . Alcohol abuse   . Asthma   . GERD (gastroesophageal reflux disease)   . Gout   . Hypertension   . OCD (obsessive compulsive disorder)   . Renal colic      Patient Active Problem List   Diagnosis Date Noted  . Moderate major depression, single episode ([place]) 08/15/2016  . Substance induced mood disorder ([place]) 08/15/2016  . Self-inflicted injury 05/69/7948  . Involuntary commitment 08/15/2016  . Alcohol abuse 02/05/2016  . Hypertension 12/05/2015  . Tachycardia 12/05/2015  . Gout 11/13/2015  . Chronic back pain 05/02/2015     Past Surgical History:  Procedure Laterality Date  . CHOLECYSTECTOMY  2012     Prior to Admission medications   Medication Sig Start Date End Date Taking? Authorizing Provider  albuterol (VENTOLIN HFA) 108 (90 Base) MCG/ACT inhaler Inhale 2 puffs into  the lungs every 4 (four) hours as needed for wheezing or shortness of breath.    Historical Provider, MD  allopurinol (ZYLOPRIM) 300 MG tablet Take 300 mg by mouth every morning.    Historical Provider, MD  colchicine 0.6 MG tablet 1 tablet once or twice a day with food Patient not taking: Reported on 02/19/2017 04/30/16 05/31/16  Larene Beach A McGowan, PA-C  dexlansoprazole (DEXILANT) 60 MG capsule Take 60 mg by mouth every morning.    Historical Provider, MD  etodolac (LODINE) 200 MG capsule Take 1 capsule (200 mg total) by mouth every 8 (eight) hours. Patient not taking: Reported on 08/15/2016 07/27/16   Loney Hering, MD  etodolac (LODINE) 200 MG capsule Take 1 capsule (200 mg total) by mouth every 8 (eight) hours. Patient not taking: Reported on 02/19/2017 11/19/16   Loney Hering, MD  FLUoxetine (PROZAC) 20 MG capsule Take 1 capsule (20 mg total) by mouth daily. Patient not taking: Reported on 02/19/2017 08/15/16   Gonzella Lex, MD  Fluticasone-Salmeterol (ADVAIR) 100-50 MCG/DOSE AEPB Inhale 1 puff into the lungs 2 (two) times daily.    Historical Provider, MD  HYDROcodone-acetaminophen (NORCO/VICODIN) 5-325 MG tablet Take 1-2 tablets by mouth every 4 (four) hours as needed for moderate pain. Patient not taking: Reported on 08/15/2016 05/02/16   Hinda Kehr, MD  ibuprofen (ADVIL,MOTRIN) 600 MG tablet Take 1 tablet (600 mg total) by mouth every 6 (six) hours as needed. Patient not taking: Reported on 08/15/2016 12/31/15  Sable Feil, PA-C  ketorolac (TORADOL) 10 MG tablet Take 1 tablet (10 mg total) by mouth every 8 (eight) hours. Patient not taking: Reported on 08/15/2016 07/28/16   Dannielle Karvonen Menshew, PA-C  lidocaine (LIDODERM) 5 % Place 1 patch onto the skin every 12 (twelve) hours. Remove & Discard patch within 12 hours or as directed by MD Patient not taking: Reported on 02/19/2017 07/22/16 07/22/17  Nance Pear, MD  lidocaine (LIDODERM) 5 % Place 1 patch onto the skin every 12 (twelve)  hours. Remove & Discard patch within 12 hours or as directed by MD Patient not taking: Reported on 03/02/2017 02/27/17 02/27/18  Loney Hering, MD  lisinopril (PRINIVIL,ZESTRIL) 20 MG tablet Take 20 mg by mouth every morning.     Historical Provider, MD  LORazepam (ATIVAN) 1 MG tablet Take 1 tablet (1 mg total) by mouth 2 (two) times daily as needed for anxiety. Patient not taking: Reported on 04/30/2016 04/01/16   Daymon Larsen, MD  naproxen (EC NAPROSYN) 500 MG EC tablet Take 1 tablet (500 mg total) by mouth 2 (two) times daily with a meal. Patient not taking: Reported on 08/15/2016 02/13/16   Alita Chyle Bacon Menshew, PA-C  omeprazole (PRILOSEC) 20 MG capsule Take 20 mg by mouth daily.    Historical Provider, MD  ondansetron (ZOFRAN ODT) 4 MG disintegrating tablet Take 1 tablet (4 mg total) by mouth every 8 (eight) hours as needed for nausea or vomiting. Patient not taking: Reported on 02/19/2017 11/19/16   Loney Hering, MD  ondansetron (ZOFRAN) 4 MG tablet Take 1 tablet (4 mg total) by mouth every 8 (eight) hours as needed for nausea or vomiting. Patient not taking: Reported on 02/19/2017 01/26/17   Rudene Re, MD  oxyCODONE-acetaminophen (PERCOCET) 5-325 MG tablet Take 2 tablets by mouth every 6 (six) hours as needed for moderate pain or severe pain. 03/07/17   Earleen Newport, MD  oxyCODONE-acetaminophen (ROXICET) 5-325 MG tablet Take 1 tablet by mouth every 6 (six) hours as needed. Patient not taking: Reported on 08/15/2016 07/27/16   Loney Hering, MD  potassium chloride (K-DUR) 10 MEQ tablet Take 1 tablet (10 mEq total) by mouth daily. Patient not taking: Reported on 08/15/2016 04/30/16   Nori Riis, PA-C     Allergies Patient has no known allergies.   No family history on file.  Social History Social History  Substance Use Topics  . Smoking status: Former Research scientist (life sciences)  . Smokeless tobacco: Never Used  . Alcohol use Yes     Comment: 1 pint of vodka a day    Review of  Systems  Constitutional:   No fever or chills.  ENT:   No sore throat. No rhinorrhea. Cardiovascular:   No chest pain. Respiratory:   No dyspnea or cough. Gastrointestinal:   Negative for abdominal pain, vomiting and diarrhea.  Genitourinary:   Negative for dysuria or difficulty urinating. Musculoskeletal:   Right leg pain Neurological:   Negative for headaches 10-point ROS otherwise negative.  ____________________________________________   PHYSICAL EXAM:  VITAL SIGNS: ED Triage Vitals  Enc Vitals Group     BP 03/07/17 2100 (!) 148/103     Pulse Rate 03/07/17 2100 84     Resp 03/07/17 2100 18     Temp 03/07/17 2100 98.3 F (36.8 C)     Temp Source 03/07/17 2100 Oral     SpO2 03/07/17 2100 100 %     Weight 03/07/17 2100 200 lb (90.7 kg)  Height 03/07/17 2100 6' (1.829 m)     Head Circumference --      Peak Flow --      Pain Score 03/07/17 2057 10     Pain Loc --      Pain Edu? --      Excl. in [place]? --     Vital signs reviewed, nursing assessments reviewed.   Constitutional:   Alert and oriented. Very anxious but not in distress. Eyes:   No scleral icterus. No conjunctival pallor. PERRL. EOMI.  No nystagmus. ENT   Head:   Normocephalic and atraumatic.   Nose:   No congestion/rhinnorhea. No septal hematoma   Mouth/Throat:   MMM, no pharyngeal erythema. No peritonsillar mass.    Neck:   No stridor. No SubQ emphysema. No meningismus. Hematological/Lymphatic/Immunilogical:   No cervical lymphadenopathy. Cardiovascular:   RRR. Symmetric bilateral radial and DP pulses.  No murmurs.  Respiratory:   Normal respiratory effort without tachypnea nor retractions. Breath sounds are clear and equal bilaterally. No wheezes/rales/rhonchi. Gastrointestinal:   Soft and nontender. Non distended. There is no CVA tenderness.  No rebound, rigidity, or guarding. Genitourinary:   deferred Musculoskeletal:   Right forearm has a linear laceration that was repaired earlier today.  The area is clean and dry and sutures are in place. Hemostatic. There is a significant amount of swelling around that area on the distal dorsal right forearm. Compartments are soft. Distal tendon function is intact. Hand is not cold or pale. Intact distal sensation and capillary refill.  Right lower leg has a 9 cm linear laceration on the medial aspect. Laceration extends to the subcutaneous fat, and it does not look like this tissue where has been violated as there is no bleeding from deeper space, compartments are soft, no tenderness on deeper space palpation from the sides, not gaping wound other than through the dermis.. Neurologic:   Normal speech and language.  CN 2-10 normal. Motor grossly intact. No gross focal neurologic deficits are appreciated.  Skin:    Skin is warm, dry with injuries as noted above. There is also a small abrasion over the right fourth MCP distally. The patient was punching the wall last night when he was here for alcohol intoxication. The patient does not remember this.. No rash noted.  No petechiae, purpura, or bullae.  ____________________________________________    LABS (pertinent positives/negatives) (all labs ordered are listed, but only abnormal results are displayed) Labs Reviewed - No data to display ____________________________________________   EKG    ____________________________________________    RADIOLOGY  Dg Forearm Right  Result Date: 03/07/2017 CLINICAL DATA:  56 year old male with a laceration from an night injuring the posterior aspect of the mid to distal right forearm. EXAM: RIGHT FOREARM - 2 VIEW COMPARISON:  No priors. FINDINGS: There is no evidence of fracture or other focal bone lesions. Soft tissue swelling in the dorsal aspect of the distal forearm. No retained radiopaque foreign body. IMPRESSION: 1. No acute radiographic abnormality to the radius or ulna. 2. No retained radiopaque foreign body. Electronically Signed   By: Vinnie Langton M.D.   On: 03/07/2017 16:57   Ct Angio Low Extrem Right W &/or Wo Contrast  Result Date: 03/07/2017 CLINICAL DATA:  Knife laceration to medial right calf EXAM: CT ANGIOGRAPHY LOWER RIGHT EXTREMITY TECHNIQUE: Standard CT imaging of the leg was performed very arterial phase enhancement. CONTRAST:  125 cc Isovue 370 COMPARISON:  None. FINDINGS: Defect in the medial scan in the mid to lower  calf region is compatible with laceration. There is some minimal subcutaneous edema/ hemorrhage. No evidence for active extravasation. No intramuscular hematoma. No evidence for radiopaque soft tissue foreign body. Review of the MIP images confirms the above findings. IMPRESSION: 1. No evidence for active extravasation. 2. No intramuscular or subcutaneous hematoma. 3. No retained radiopaque soft tissue foreign body. Electronically Signed   By: Misty Stanley M.D.   On: 03/07/2017 22:50   Dg Hand Complete Right  Result Date: 03/07/2017 CLINICAL DATA:  56 year old male with fall an abrasion over the distal fourth metacarpal phalangeal joint. EXAM: RIGHT HAND - COMPLETE 3+ VIEW COMPARISON:  None. FINDINGS: There is no acute fracture or dislocation. The bones are mildly osteopenic. There is soft tissue swelling of the hand primarily over the dorsal aspect of the metacarpal region. No radiopaque foreign object. IMPRESSION: No acute fracture or dislocation. Electronically Signed   By: Anner Crete M.D.   On: 03/07/2017 22:19    ____________________________________________   PROCEDURES Procedures LACERATION REPAIR Performed by: Carrie Mew Authorized by: Carrie Mew Consent: Verbal consent obtained. Risks and benefits: risks, benefits and alternatives were discussed Consent given by: patient Patient identity confirmed: provided demographic data Prepped and Draped in normal sterile fashion Wound explored to base and bloodless field  Laceration Location: Right lower leg  Laceration Length:  9cm  No Foreign Bodies seen or palpated  Anesthesia: local infiltration  Local anesthetic: lidocaine 1% without epinephrine  Anesthetic total: 10 ml  Irrigation method: syringe Amount of cleaning: Extensive   Skin closure: 4-0 Ethilon   Number of sutures: 11  Technique: Running   Patient tolerance: Patient tolerated the procedure well with no immediate complications.  ____________________________________________   INITIAL IMPRESSION / ASSESSMENT AND PLAN / ED COURSE  Pertinent labs & imaging results that were available during my care of the patient were reviewed by me and considered in my medical decision making (see chart for details).  Self-inflicted injuries in the setting of alcohol abuse and home life stress.  #1 IVC pending psychiatric evaluation #2 CT angiogram right leg to evaluate for arterial injury  #3 x-ray right hand.     Clinical Course as of Mar 07 2329  Nancy Fetter Mar 07, 2017  2138 Patient presents with self-inflicted wound to the right calf. History is unreliable regarding potential deep tissue injury or arterial injury, so we will get a CT angiogram of the leg. Normal creatinine 5 days ago. Also has significant tenderness and swelling in the right forearm. We'll obtain x-rays of this wasn't done earlier.  [PS]  2256 D/w radiology. CTA negative.   [PS]    Clinical Course User Index [PS] Carrie Mew, MD     ----------------------------------------- 11:31 PM on 03/07/2017 -----------------------------------------  CT negative. Discussed with radiologist. X-ray right hand negative. Wound repaired. Patient is medically stable. Continue CIWA , under IVC awaiting psychiatry evaluation.  ____________________________________________   FINAL CLINICAL IMPRESSION(S) / ED DIAGNOSES  Final diagnoses:  Right leg pain  Laceration of calf  Laceration of right lower extremity, initial encounter  Self-inflicted injury      New Prescriptions   No  medications on file     Portions of this note were generated with dragon dictation software. Dictation errors may occur despite best attempts at proofreading.    Carrie Mew, MD 03/07/17 [phone]

## 2017-03-07 NOTE — ED Notes (Signed)
Pt taken to CT.

## 2017-03-07 NOTE — ED Notes (Signed)
IVC papers has been initiated per Dr. Jerene Canny orders.

## 2017-03-07 NOTE — ED Triage Notes (Addendum)
PT arrived to ED after reportedly "falling: on a kitchen knife. Pt reports also having cut arm on a screwdriver in bathroom. Pt has significant swelling to right forearm and continued bleeding. PT denies wanting to hurt self but EMS reports pt has had a half bottle of wine and had screwdriver and multiple heads on counter with blood covering sink, screwdriver and all screwdriver heads. Pt is tearful upon arrival and talking about stress involving wife.   Pt reports significant tenderness in arm and pain increases with movement. Sensation intact and pulses present. Cap refill <3 seconds.

## 2017-03-07 NOTE — ED Notes (Signed)
PT continues to deny self harm, SI or HI to staff.

## 2017-03-07 NOTE — BH Assessment (Signed)
Assessment Note  Miguel [name] is an 56 y.o. male. Miguel [name] arrived to the ED by way of EMS.  He reports that he spoke with his wife who is at rehabilitation.  She is to be discharged on Tuesday at 7:00 am.  He is to be there at 7:00 in the morning and he does not think that he can be there.  He states that he had a knife in the kitchen so he cut himself.  He states that he did not know that he cut himself so deep. He stated that he tried to walk out the door and he fell. He states that he does not know what made him want to cut himself.  "I was just upset" he later added.  He reports that he has been feeling depressed and is stressed out due to finances.  He reports symptoms of anxiety.  He denied having auditory or visual hallucinations.  He denied suicidal or homicidal ideation or intent.  He reports stressors of his wife's multiple health concerns.  He reports that he does not know why he does this, "It does not make sense to me why I do this, it is not going to get me to her appointment".  He reports the use of alcohol. BAL is unknown at this time.   Diagnosis: Depression, Alcohol Misuse  Past Medical History:  Past Medical History:  Diagnosis Date  . Alcohol abuse   . Asthma   . GERD (gastroesophageal reflux disease)   . Gout   . Hypertension   . OCD (obsessive compulsive disorder)   . Renal colic     Past Surgical History:  Procedure Laterality Date  . CHOLECYSTECTOMY  2012    Family History: No family history on file.  Social History:  reports that he has quit smoking. He has never used smokeless tobacco. He reports that he drinks alcohol. He reports that he does not use drugs.  Additional Social History:  Alcohol / Drug Use History of alcohol / drug use?: Yes Substance #1 Name of Substance 1: Alcohol 1 - Age of First Use: 19 1 - Amount (size/oz): Unknown 1 - Frequency: Daily 1 - Last Use / Amount: 03/07/2017  CIWA: CIWA-Ar BP: (!) 156/100 Pulse Rate: 80 COWS:     Allergies: No Known Allergies  Home Medications:  (Not in a hospital admission)  OB/GYN Status:  No LMP for male patient.  General Assessment Data Location of Assessment: [facility] ED TTS Assessment: In system Is this a Tele or Face-to-Face Assessment?: Face-to-Face Is this an Initial Assessment or a Re-assessment for this encounter?: Initial Assessment Marital status: Married [place] name: n/a Is patient pregnant?: No Pregnancy Status: No Living Arrangements: Spouse/significant other Can pt return to current living arrangement?: Yes Admission Status: Voluntary Is patient capable of signing voluntary admission?: Yes Referral Source: Self/Family/Friend Insurance type: Medicaid  Medical Screening Exam ([place]) Medical Exam completed: Yes  Crisis Care Plan Living Arrangements: Spouse/significant other Legal Guardian: Other: (Self) Name of Psychiatrist: None at this time Name of Therapist: None at this time  Education Status Is patient currently in school?: No Current Grade: n/a Highest grade of school patient has completed: 12th grade Name of school: [employer] person: n/a  Risk to self with the past 6 months Suicidal Ideation: No Has patient been a risk to self within the past 6 months prior to admission? : No Suicidal Intent: No Has patient had any suicidal intent within the past 6 months  prior to admission? : No Is patient at risk for suicide?: No Suicidal Plan?: No Has patient had any suicidal plan within the past 6 months prior to admission? : No Access to Means: No What has been your use of drugs/alcohol within the last 12 months?: Use of alcohol Previous Attempts/Gestures: No (Denied by patient, he has cut his arm and leg in the past 2 ) How many times?: 0 Other Self Harm Risks: cutting Triggers for Past Attempts: None known Intentional Self Injurious Behavior: Cutting Comment - Self Injurious Behavior: Arrived with a major cut to his  leg Family Suicide History: No Recent stressful life event(s): Financial Problems, Other (Comment) (wife's health concerns) Persecutory voices/beliefs?: No Depression: Yes Depression Symptoms: Tearfulness, Feeling worthless/self pity Substance abuse history and/or treatment for substance abuse?: Yes Suicide prevention information given to non-admitted patients: Not applicable  Risk to Others within the past 6 months Homicidal Ideation: No Does patient have any lifetime risk of violence toward others beyond the six months prior to admission? : No Thoughts of Harm to Others: No Current Homicidal Intent: No Current Homicidal Plan: No Access to Homicidal Means: No Identified Victim: None identified History of harm to others?: No Assessment of Violence: None Noted Does patient have access to weapons?: No Criminal Charges Pending?: No Does patient have a court date: No Is patient on probation?: No  Psychosis Hallucinations: None noted Delusions: None noted  Mental Status Report Appearance/Hygiene: Unable to Assess Eye Contact: Poor Motor Activity: Unremarkable Speech: Tangential Level of Consciousness: Alert Mood: Depressed, Worthless, low self-esteem Affect: Flat Anxiety Level: None Thought Processes: Tangential Judgement: Partial Orientation: Person, Place, Situation Obsessive Compulsive Thoughts/Behaviors: None  Cognitive Functioning Concentration: Normal Memory: Recent Intact IQ: Average Insight: Poor Impulse Control: Poor Appetite: Fair Sleep: Increased Vegetative Symptoms: None  ADLScreening [facility] Assessment Services) Patient's cognitive ability adequate to safely complete daily activities?: Yes Patient able to express need for assistance with ADLs?: Yes Independently performs ADLs?: Yes (appropriate for developmental age)  Prior Inpatient Therapy Prior Inpatient Therapy: No Prior Therapy Dates: n/a Prior Therapy Facilty/Provider(s): n/a Reason for  Treatment: n/a  Prior Outpatient Therapy Prior Outpatient Therapy: No Prior Therapy Dates: n/a Prior Therapy Facilty/Provider(s): n/a Reason for Treatment: n/a Does patient have an ACCT team?: No Does patient have Intensive In-House Services?  : No Does patient have [facility] services? : No Does patient have P4CC services?: No  ADL Screening (condition at time of admission) Patient's cognitive ability adequate to safely complete daily activities?: Yes Patient able to express need for assistance with ADLs?: Yes Independently performs ADLs?: Yes (appropriate for developmental age)       Abuse/Neglect Assessment (Assessment to be complete while patient is alone) Physical Abuse: Denies Verbal Abuse: Denies Sexual Abuse: Denies Exploitation of patient/patient's resources: Denies Self-Neglect: Denies     Regulatory affairs officer (For Healthcare) Does Patient Have a Medical Advance Directive?: No Would patient like information on creating a medical advance directive?: No - Patient declined    Additional Information 1:1 In Past 12 Months?: No CIRT Risk: No Elopement Risk: No Does patient have medical clearance?: Yes     Disposition:  Disposition Initial Assessment Completed for this Encounter: Yes Disposition of Patient: Other dispositions  On Site Evaluation by:   Reviewed with Physician:    Elmer Bales 03/07/2017 11:34 PM

## 2017-03-07 NOTE — ED Notes (Signed)
Pt returned from CT °

## 2017-03-07 NOTE — ED Provider Notes (Signed)
[facility] Provider Note   CSN: [id number] Arrival date & time: 03/07/17  1624     History   Chief Complaint Chief Complaint  Patient presents with  . Laceration    HPI Miguel [name] is a 56 y.o. male presents to the emergency department via EMS for evaluation of right forearm laceration. Patient states he was walking in the kitchen when he slipped, fell and cut his dorsal aspect of the right forearm with a knife. He denies any other injury to his body except for abrasion to his right knee. He describes this as carpet burn, no pain with ambulation through the right knee. He denies any head injury, headache, neck pain. His forearm pain is moderate. Patient states he had been drinking alcohol, 2 glasses of wine today. He denies any numbness or tingling in the right hand. He denies any self-inflicted wound. No suicidal or homicidal ideations. Tetanus is up-to-date.  HPI  Past Medical History:  Diagnosis Date  . Alcohol abuse   . Asthma   . GERD (gastroesophageal reflux disease)   . Gout   . Hypertension   . OCD (obsessive compulsive disorder)   . Renal colic     Patient Active Problem List   Diagnosis Date Noted  . Moderate major depression, single episode ([place]) 08/15/2016  . Substance induced mood disorder ([place]) 08/15/2016  . Self-inflicted injury 78/29/5621  . Involuntary commitment 08/15/2016  . Alcohol abuse 02/05/2016  . Hypertension 12/05/2015  . Tachycardia 12/05/2015  . Gout 11/13/2015  . Chronic back pain 05/02/2015    Past Surgical History:  Procedure Laterality Date  . CHOLECYSTECTOMY  2012       Home Medications    Prior to Admission medications   Medication Sig Start Date End Date Taking? Authorizing Provider  albuterol (VENTOLIN HFA) 108 (90 Base) MCG/ACT inhaler Inhale 2 puffs into the lungs every 4 (four) hours as needed for wheezing or shortness of breath.    Historical Provider, MD  allopurinol (ZYLOPRIM) 300 MG tablet Take 300 mg  by mouth every morning.    Historical Provider, MD  colchicine 0.6 MG tablet 1 tablet once or twice a day with food Patient not taking: Reported on 02/19/2017 04/30/16 05/31/16  Larene Beach A McGowan, PA-C  dexlansoprazole (DEXILANT) 60 MG capsule Take 60 mg by mouth every morning.    Historical Provider, MD  etodolac (LODINE) 200 MG capsule Take 1 capsule (200 mg total) by mouth every 8 (eight) hours. Patient not taking: Reported on 08/15/2016 07/27/16   Loney Hering, MD  etodolac (LODINE) 200 MG capsule Take 1 capsule (200 mg total) by mouth every 8 (eight) hours. Patient not taking: Reported on 02/19/2017 11/19/16   Loney Hering, MD  FLUoxetine (PROZAC) 20 MG capsule Take 1 capsule (20 mg total) by mouth daily. Patient not taking: Reported on 02/19/2017 08/15/16   Gonzella Lex, MD  Fluticasone-Salmeterol (ADVAIR) 100-50 MCG/DOSE AEPB Inhale 1 puff into the lungs 2 (two) times daily.    Historical Provider, MD  HYDROcodone-acetaminophen (NORCO/VICODIN) 5-325 MG tablet Take 1-2 tablets by mouth every 4 (four) hours as needed for moderate pain. Patient not taking: Reported on 08/15/2016 05/02/16   Hinda Kehr, MD  ibuprofen (ADVIL,MOTRIN) 600 MG tablet Take 1 tablet (600 mg total) by mouth every 6 (six) hours as needed. Patient not taking: Reported on 08/15/2016 12/31/15   Sable Feil, PA-C  ketorolac (TORADOL) 10 MG tablet Take 1 tablet (10 mg total) by mouth every 8 (eight)  hours. Patient not taking: Reported on 08/15/2016 07/28/16   Dannielle Karvonen Menshew, PA-C  lidocaine (LIDODERM) 5 % Place 1 patch onto the skin every 12 (twelve) hours. Remove & Discard patch within 12 hours or as directed by MD Patient not taking: Reported on 02/19/2017 07/22/16 07/22/17  Nance Pear, MD  lidocaine (LIDODERM) 5 % Place 1 patch onto the skin every 12 (twelve) hours. Remove & Discard patch within 12 hours or as directed by MD Patient not taking: Reported on 03/02/2017 02/27/17 02/27/18  Loney Hering, MD  lisinopril  (PRINIVIL,ZESTRIL) 20 MG tablet Take 20 mg by mouth every morning.     Historical Provider, MD  LORazepam (ATIVAN) 1 MG tablet Take 1 tablet (1 mg total) by mouth 2 (two) times daily as needed for anxiety. Patient not taking: Reported on 04/30/2016 04/01/16   Daymon Larsen, MD  naproxen (EC NAPROSYN) 500 MG EC tablet Take 1 tablet (500 mg total) by mouth 2 (two) times daily with a meal. Patient not taking: Reported on 08/15/2016 02/13/16   Alita Chyle Bacon Menshew, PA-C  omeprazole (PRILOSEC) 20 MG capsule Take 20 mg by mouth daily.    Historical Provider, MD  ondansetron (ZOFRAN ODT) 4 MG disintegrating tablet Take 1 tablet (4 mg total) by mouth every 8 (eight) hours as needed for nausea or vomiting. Patient not taking: Reported on 02/19/2017 11/19/16   Loney Hering, MD  ondansetron (ZOFRAN) 4 MG tablet Take 1 tablet (4 mg total) by mouth every 8 (eight) hours as needed for nausea or vomiting. Patient not taking: Reported on 02/19/2017 01/26/17   Rudene Re, MD  oxyCODONE-acetaminophen (PERCOCET) 5-325 MG tablet Take 2 tablets by mouth every 6 (six) hours as needed for moderate pain or severe pain. 03/07/17   Earleen Newport, MD  oxyCODONE-acetaminophen (ROXICET) 5-325 MG tablet Take 1 tablet by mouth every 6 (six) hours as needed. Patient not taking: Reported on 08/15/2016 07/27/16   Loney Hering, MD  potassium chloride (K-DUR) 10 MEQ tablet Take 1 tablet (10 mEq total) by mouth daily. Patient not taking: Reported on 08/15/2016 04/30/16   Nori Riis, PA-C    Family History No family history on file.  Social History Social History  Substance Use Topics  . Smoking status: Former Research scientist (life sciences)  . Smokeless tobacco: Never Used  . Alcohol use Yes     Comment: 1 pint of vodka a day     Allergies   Patient has no known allergies.   Review of Systems Review of Systems  Constitutional: Negative.  Negative for activity change, appetite change, chills and fever.  HENT: Negative for  ear pain, mouth sores, sinus pressure and trouble swallowing.   Eyes: Negative for photophobia, pain and visual disturbance.  Respiratory: Negative for chest tightness and shortness of breath.   Cardiovascular: Negative for chest pain and leg swelling.  Gastrointestinal: Negative for abdominal distention, abdominal pain and vomiting.  Musculoskeletal: Negative for gait problem.  Skin: Positive for wound. Negative for color change and rash.  Neurological: Negative for dizziness, syncope, numbness and headaches.  Hematological: Negative for adenopathy.  Psychiatric/Behavioral: Negative for agitation, behavioral problems, confusion, hallucinations, self-injury and suicidal ideas.     Physical Exam Updated Vital Signs BP (!) 165/93 (BP Location: Left Arm)   Pulse 97   Temp 97.9 F (36.6 C) (Oral)   Resp 16   Ht 6' (1.829 m)   Wt 90.7 kg   SpO2 97%   BMI 27.12 kg/m  Physical Exam  Constitutional: He is oriented to person, place, and time. He appears well-developed and well-nourished.  HENT:  Head: Normocephalic and atraumatic.  Right Ear: External ear normal.  Left Ear: External ear normal.  Eyes: Conjunctivae and EOM are normal. Pupils are equal, round, and reactive to light. Right eye exhibits no discharge. Left eye exhibits no discharge.  Neck: Normal range of motion. Neck supple.  Cardiovascular: Normal rate and regular rhythm.   No murmur heard. Pulmonary/Chest: Effort normal and breath sounds normal. No respiratory distress. He has no wheezes.  Abdominal: Soft. There is no tenderness. There is no guarding.  Musculoskeletal: He exhibits no edema.  Examination of the cervical thoracic and lumbar spine shows no spinous process tenderness. Full range of motion with no discomfort. Examination right upper extremity shows the patient has soft tissue laceration along the distal third of the dorsal aspect of the forearm. No tendon deficits noted. Sensation is intact distally.  Laceration is 1 inch long with mild soft tissue swelling. No sign of foreign body. He has full range of motion of the elbow with no discomfort.  Neurological: He is alert and oriented to person, place, and time. No cranial nerve deficit.  Skin: Skin is warm and dry.  Psychiatric: He has a normal mood and affect. His behavior is normal. Judgment and thought content normal.  Nursing note and vitals reviewed.    ED Treatments / Results  Labs (all labs ordered are listed, but only abnormal results are displayed) Labs Reviewed - No data to display  EKG  EKG Interpretation None       Radiology Dg Forearm Right  Result Date: 03/07/2017 CLINICAL DATA:  56 year old male with a laceration from an night injuring the posterior aspect of the mid to distal right forearm. EXAM: RIGHT FOREARM - 2 VIEW COMPARISON:  No priors. FINDINGS: There is no evidence of fracture or other focal bone lesions. Soft tissue swelling in the dorsal aspect of the distal forearm. No retained radiopaque foreign body. IMPRESSION: 1. No acute radiographic abnormality to the radius or ulna. 2. No retained radiopaque foreign body. Electronically Signed   By: Vinnie Langton M.D.   On: 03/07/2017 16:57    Procedures Procedures (including critical care time) LACERATION REPAIR Performed by: Feliberto Gottron Authorized by: Feliberto Gottron Consent: Verbal consent obtained. Risks and benefits: risks, benefits and alternatives were discussed Consent given by: patient Patient identity confirmed: provided demographic data Prepped and Draped in normal sterile fashion Wound explored  Laceration Location: right forearm  Laceration Length: 3 cm  No Foreign Bodies seen or palpated  Anesthesia: local infiltration  Local anesthetic: lidocaine 1% without epinephrine  Anesthetic total: 3 ml  Irrigation method: syringe Amount of cleaning: standard  Skin closure: Simple interrupted   Number of  sutures: 6   Technique: Simple interrupted 5-0 nylon   Patient tolerance: Patient tolerated the procedure well with no immediate complications.  Medications Ordered in ED Medications  lidocaine (PF) (XYLOCAINE) 1 % injection 2 mL (not administered)     Initial Impression / Assessment and Plan / ED Course  I have reviewed the triage vital signs and the nursing notes.  Pertinent labs & imaging results that were available during my care of the patient were reviewed by me and considered in my medical decision making (see chart for details).     56 year old male with mechanical fall, cutting his right forearm with a kitchen knife. X-rays are normal with no sign of foreign body  or acute osseous abnormality. No tendon deficits noted on exam. Patient denies any self-inflicted harm, suicidal ideations. Patient admits to have been drinking, states 2 glasses of wine. Patient requesting to go home, will call taxi. He is educated on wound care, will follow-up for suture removal in one week. He is educated on signs and symptoms to return to the ED for. Tetanus is up-to-date.  Final Clinical Impressions(s) / ED Diagnoses   Final diagnoses:  Forearm laceration, right, initial encounter  Fall, initial encounter    New Prescriptions New Prescriptions   OXYCODONE-ACETAMINOPHEN (PERCOCET) 5-325 MG TABLET    Take 2 tablets by mouth every 6 (six) hours as needed for moderate pain or severe pain.     Duanne Guess, PA-C 03/07/17 [place], MD 03/08/17 [phone]

## 2017-03-08 DIAGNOSIS — F1994 Other psychoactive substance use, unspecified with psychoactive substance-induced mood disorder: Secondary | ICD-10-CM

## 2017-03-08 NOTE — ED Notes (Signed)
Pt given his lunch to eat.

## 2017-03-08 NOTE — Consult Note (Signed)
[facility] Consult   Reason for Consult:  Consult for 56 year old man with history of alcohol abuse who comes to the emergency room having cut himself Referring Physician:  [place] Patient Identification: Miguel [name] MRN:  [id number] Principal Diagnosis: Substance induced mood disorder [facility]) Diagnosis:   Patient Active Problem List   Diagnosis Date Noted  . Moderate major depression, single episode ([place]) [F32.1] 08/15/2016  . Substance induced mood disorder ([place]) [F19.94] 08/15/2016  . Self-inflicted injury [N82.95] 08/15/2016  . Involuntary commitment [Z04.6] 08/15/2016  . Alcohol abuse [F10.10] 02/05/2016  . Hypertension [I10] 12/05/2015  . Tachycardia [R00.0] 12/05/2015  . Gout [M10.9] 11/13/2015  . Chronic back pain [M54.9, G89.29] 05/02/2015    Total Time spent with patient: 1 hour  Subjective:   Miguel [name] is a 56 y.o. male patient admitted with "I just miss her so much".  HPI:  Patient interviewed chart reviewed. 56 year old man with a history of alcohol abuse. He's had multiple presentations to the emergency room over the last few weeks intoxicated. He came into the hospital this time once again presumably intoxicated although they did not check his blood alcohol level again. He had cut himself on the right wrist and poked date small size screwdriver into his leg. Patient says he is overwhelmed with sadness because his wife is in rehabilitation. Apparently his wife has multiple sclerosis and has been in the hospital or in rehabilitation for a couple of weeks. When on his own at home the patient gets even more overwhelmed and emotionally dysregulation and then he usually is. He says she is being discharged tomorrow and he is looking forward to seeing her. He claims that he only drinks "a little bit" although his blood alcohol level has been over 300 several times recently. Denies that he is using any other drugs currently. Says his baseline mood is feeling sad. He  always returns to talking about his wife again. Denies any suicidal thought however denies any wish to die or harm anyone else. Not having any hallucinations or psychosis.  Medical history: Patient has bandages on his right arm where he cut himself and on his right leg where he poked himself. Medical status is fairly stable other than his ongoing alcohol abuse  Social history: Lives with his wife and takes care of her most of the time but since she has been in rehabilitation he has been by himself and has apparently been letting himself go. Really wouldn't is looking forward to having her come home again.  Substance abuse history: Long history of alcohol abuse primarily. Many visits to the hospital intoxicated and with alcohol abuse. No history of DTs or alcohol withdrawal seizures.  Past Psychiatric History: Patient has had multiple presentations similar to this one. His sadness and mood problems seem directly related to his intoxication. Has never been on antidepressants. Never actually tried to kill himself but has cut himself several times while intoxicated. No history of violence to others.  Risk to Self: Suicidal Ideation: No Suicidal Intent: No Is patient at risk for suicide?: No Suicidal Plan?: No Access to Means: No What has been your use of drugs/alcohol within the last 12 months?: Use of alcohol How many times?: 0 Other Self Harm Risks: cutting Triggers for Past Attempts: None known Intentional Self Injurious Behavior: Cutting Comment - Self Injurious Behavior: Arrived with a major cut to his leg Risk to Others: Homicidal Ideation: No Thoughts of Harm to Others: No Current Homicidal Intent: No Current Homicidal Plan:  No Access to Homicidal Means: No Identified Victim: None identified History of harm to others?: No Assessment of Violence: None Noted Does patient have access to weapons?: No Criminal Charges Pending?: No Does patient have a court date: No Prior Inpatient  Therapy: Prior Inpatient Therapy: No Prior Therapy Dates: n/a Prior Therapy Facilty/Provider(s): n/a Reason for Treatment: n/a Prior Outpatient Therapy: Prior Outpatient Therapy: No Prior Therapy Dates: n/a Prior Therapy Facilty/Provider(s): n/a Reason for Treatment: n/a Does patient have an ACCT team?: No Does patient have Intensive In-House Services?  : No Does patient have [facility] services? : No Does patient have P4CC services?: No  Past Medical History:  Past Medical History:  Diagnosis Date  . Alcohol abuse   . Asthma   . GERD (gastroesophageal reflux disease)   . Gout   . Hypertension   . OCD (obsessive compulsive disorder)   . Renal colic     Past Surgical History:  Procedure Laterality Date  . CHOLECYSTECTOMY  2012   Family History: No family history on file. Family Psychiatric  History: Positive for alcohol Social History:  History  Alcohol Use  . Yes    Comment: 1 pint of vodka a day     History  Drug Use No    Social History   Social History  . Marital status: Married    Spouse name: N/A  . Number of children: N/A  . Years of education: N/A   Social History Main Topics  . Smoking status: Former Research scientist (life sciences)  . Smokeless tobacco: Never Used  . Alcohol use Yes     Comment: 1 pint of vodka a day  . Drug use: No  . Sexual activity: Not Asked   Other Topics Concern  . None   Social History Narrative  . None   Additional Social History:    Allergies:  No Known Allergies  Labs:  Results for orders placed or performed during the hospital encounter of 03/06/17 (from the past 48 hour(s))  Urine Drug Screen, Qualitative     Status: Abnormal   Collection Time: 03/06/17  4:50 PM  Result Value Ref Range   Tricyclic, Ur Screen NONE DETECTED NONE DETECTED   Amphetamines, Ur Screen NONE DETECTED NONE DETECTED   MDMA (Ecstasy)Ur Screen NONE DETECTED NONE DETECTED   Cocaine Metabolite,Ur [place] NONE DETECTED NONE DETECTED   Opiate, Ur Screen NONE DETECTED NONE  DETECTED   Phencyclidine (PCP) Ur S NONE DETECTED NONE DETECTED   Cannabinoid 50 Ng, Ur [place] NONE DETECTED NONE DETECTED   Barbiturates, Ur Screen NONE DETECTED NONE DETECTED   Benzodiazepine, Ur Scrn POSITIVE (A) NONE DETECTED   Methadone Scn, Ur NONE DETECTED NONE DETECTED    Comment: (NOTE) 798  Tricyclics, urine               Cutoff 1000 ng/mL 200  Amphetamines, urine             Cutoff 1000 ng/mL 300  MDMA (Ecstasy), urine           Cutoff 500 ng/mL 400  Cocaine Metabolite, urine       Cutoff 300 ng/mL 500  Opiate, urine                   Cutoff 300 ng/mL 600  Phencyclidine (PCP), urine      Cutoff 25 ng/mL 700  Cannabinoid, urine              Cutoff 50 ng/mL 800  Barbiturates, urine  Cutoff 200 ng/mL 900  Benzodiazepine, urine           Cutoff 200 ng/mL 1000 Methadone, urine                Cutoff 300 ng/mL 1100 1200 The urine drug screen provides only a preliminary, unconfirmed 1300 analytical test result and should not be used for non-medical 1400 purposes. Clinical consideration and professional judgment should 1500 be applied to any positive drug screen result due to possible 1600 interfering substances. A more specific alternate chemical method 1700 must be used in order to obtain a confirmed analytical result.  1800 Gas chromato graphy / mass spectrometry (GC/MS) is the preferred 1900 confirmatory method.     Current Facility-Administered Medications  Medication Dose Route Frequency Provider Last Rate Last Dose  . LORazepam (ATIVAN) tablet 0-4 mg  0-4 mg Oral Q6H Carrie Mew, MD   2 mg at 03/08/17 1149   Followed by  . [START ON 03/10/2017] LORazepam (ATIVAN) tablet 0-4 mg  0-4 mg Oral Q12H Carrie Mew, MD      . thiamine (VITAMIN B-1) tablet 100 mg  100 mg Oral Daily Carrie Mew, MD   100 mg at 03/08/17 1149   Or  . thiamine (B-1) injection 100 mg  100 mg Intravenous Daily Carrie Mew, MD       Current Outpatient Prescriptions   Medication Sig Dispense Refill  . omeprazole (PRILOSEC) 20 MG capsule Take 20 mg by mouth daily.    Marland Kitchen albuterol (VENTOLIN HFA) 108 (90 Base) MCG/ACT inhaler Inhale 2 puffs into the lungs every 4 (four) hours as needed for wheezing or shortness of breath.    . allopurinol (ZYLOPRIM) 300 MG tablet Take 300 mg by mouth every morning.    . colchicine 0.6 MG tablet 1 tablet once or twice a day with food (Patient not taking: Reported on 02/19/2017) 40 tablet 2  . dexlansoprazole (DEXILANT) 60 MG capsule Take 60 mg by mouth every morning.    . etodolac (LODINE) 200 MG capsule Take 1 capsule (200 mg total) by mouth every 8 (eight) hours. (Patient not taking: Reported on 08/15/2016) 12 capsule 0  . etodolac (LODINE) 200 MG capsule Take 1 capsule (200 mg total) by mouth every 8 (eight) hours. (Patient not taking: Reported on 02/19/2017) 12 capsule 0  . FLUoxetine (PROZAC) 20 MG capsule Take 1 capsule (20 mg total) by mouth daily. (Patient not taking: Reported on 02/19/2017) 30 capsule 1  . Fluticasone-Salmeterol (ADVAIR) 100-50 MCG/DOSE AEPB Inhale 1 puff into the lungs 2 (two) times daily.    Marland Kitchen HYDROcodone-acetaminophen (NORCO/VICODIN) 5-325 MG tablet Take 1-2 tablets by mouth every 4 (four) hours as needed for moderate pain. (Patient not taking: Reported on 08/15/2016) 15 tablet 0  . ibuprofen (ADVIL,MOTRIN) 600 MG tablet Take 1 tablet (600 mg total) by mouth every 6 (six) hours as needed. (Patient not taking: Reported on 08/15/2016) 30 tablet 0  . ketorolac (TORADOL) 10 MG tablet Take 1 tablet (10 mg total) by mouth every 8 (eight) hours. (Patient not taking: Reported on 08/15/2016) 15 tablet 0  . lidocaine (LIDODERM) 5 % Place 1 patch onto the skin every 12 (twelve) hours. Remove & Discard patch within 12 hours or as directed by MD (Patient not taking: Reported on 02/19/2017) 10 patch 0  . lidocaine (LIDODERM) 5 % Place 1 patch onto the skin every 12 (twelve) hours. Remove & Discard patch within 12 hours or as  directed by MD (Patient not taking: Reported on  03/02/2017) 10 patch 0  . lisinopril (PRINIVIL,ZESTRIL) 20 MG tablet Take 20 mg by mouth every morning.     Marland Kitchen LORazepam (ATIVAN) 1 MG tablet Take 1 tablet (1 mg total) by mouth 2 (two) times daily as needed for anxiety. (Patient not taking: Reported on 04/30/2016) 10 tablet 0  . naproxen (EC NAPROSYN) 500 MG EC tablet Take 1 tablet (500 mg total) by mouth 2 (two) times daily with a meal. (Patient not taking: Reported on 08/15/2016) 30 tablet 0  . ondansetron (ZOFRAN ODT) 4 MG disintegrating tablet Take 1 tablet (4 mg total) by mouth every 8 (eight) hours as needed for nausea or vomiting. (Patient not taking: Reported on 02/19/2017) 20 tablet 0  . ondansetron (ZOFRAN) 4 MG tablet Take 1 tablet (4 mg total) by mouth every 8 (eight) hours as needed for nausea or vomiting. (Patient not taking: Reported on 02/19/2017) 20 tablet 0  . oxyCODONE-acetaminophen (PERCOCET) 5-325 MG tablet Take 2 tablets by mouth every 6 (six) hours as needed for moderate pain or severe pain. 20 tablet 0  . oxyCODONE-acetaminophen (ROXICET) 5-325 MG tablet Take 1 tablet by mouth every 6 (six) hours as needed. (Patient not taking: Reported on 08/15/2016) 12 tablet 0  . potassium chloride (K-DUR) 10 MEQ tablet Take 1 tablet (10 mEq total) by mouth daily. (Patient not taking: Reported on 08/15/2016) 90 tablet 0    Musculoskeletal: Strength & Muscle Tone: decreased Gait & Station: broad based Patient leans: N/A  Psychiatric Specialty Exam: Physical Exam  Nursing note and vitals reviewed. Constitutional: He appears well-developed and well-nourished.  HENT:  Head: Normocephalic and atraumatic.  Eyes: Conjunctivae are normal. Pupils are equal, round, and reactive to light.  Neck: Normal range of motion.  Cardiovascular: Regular rhythm and normal heart sounds.   Respiratory: Effort normal. No respiratory distress.  GI: Soft.  Musculoskeletal: Normal range of motion.  Neurological: He is  alert.  Skin: Skin is warm and dry.  Psychiatric: His speech is delayed. He is slowed. Thought content is not paranoid. He expresses impulsivity. He exhibits a depressed mood. He expresses no homicidal and no suicidal ideation. He exhibits abnormal recent memory.    Review of Systems  Constitutional: Negative.   HENT: Negative.   Eyes: Negative.   Respiratory: Negative.   Cardiovascular: Negative.   Gastrointestinal: Negative.   Musculoskeletal: Negative.   Skin: Negative.   Neurological: Negative.   Psychiatric/Behavioral: Positive for depression, memory loss and substance abuse. Negative for hallucinations and suicidal ideas. The patient is nervous/anxious and has insomnia.     Blood pressure (!) 159/91, pulse 83, temperature 98.8 F (37.1 C), temperature source Oral, resp. rate 20, height 6' (1.829 m), weight 90.7 kg (200 lb), SpO2 94 %.Body mass index is 27.12 kg/m.  General Appearance: Disheveled  Eye Contact:  Fair  Speech:  Slow  Volume:  Decreased  Mood:  Dysphoric  Affect:  Depressed and Tearful  Thought Process:  Disorganized  Orientation:  Full (Time, Place, and Person)  Thought Content:  Rumination and Tangential  Suicidal Thoughts:  No  Homicidal Thoughts:  No  Memory:  Immediate;   Fair Recent;   Fair Remote;   Fair  Judgement:  Fair  Insight:  Shallow  Psychomotor Activity:  Decreased  Concentration:  Concentration: Fair  Recall:  [employer] of Knowledge:  Fair  Language:  Fair  Akathisia:  No  Handed:  Right  AIMS (if indicated):     Assets:  Desire for Improvement Financial Resources/Insurance  Housing Resilience  ADL's:  Intact  Cognition:  Impaired,  Mild  Sleep:        Treatment Plan Summary: Plan   note below. Patient basically can be discharged from the emergency room follow-up with outpatient treatment for substance abuse  Disposition: Plan 56 year old man with alcohol abuse self-inflicted injury to the arm. He is now sobering up and  denies any suicidal or homicidal thoughts intense or plans. He says his most important current plan is to go pick up his wife from the hospital tomorrow. Patient says he understands that his drinking is killing him. He will be referred back to outpatient treatment and encouraged to really engage himself in substance abuse treatment. No need for inpatient hospital stay. Case reviewed with TTS and emergency room physician.  Alethia Berthold, MD 03/08/2017 4:34 PM

## 2017-03-08 NOTE — ED Notes (Signed)
Patient refused shower, Patient  States he's leaving today to pick up his wife from rehab, Nurse Sharee Pimple explained to patient that his wife was not being discharged today, patient still refused

## 2017-03-08 NOTE — ED Notes (Signed)
[facility]  PAPERS  RESCINDED  PER  DR  CLAPACS  INFORMED  RN  JILL

## 2017-03-08 NOTE — ED Notes (Signed)
Patient in bathroom

## 2017-03-08 NOTE — ED Notes (Signed)
BEHAVIORAL HEALTH ROUNDING Patient sleeping: Yes.   Patient alert and oriented: not applicable SLEEPING Behavior appropriate: Yes.  ; If no, describe: SLEEPING Nutrition and fluids offered: No SLEEPING Toileting and hygiene offered: NoSLEEPING Sitter present: not applicable, Q 15 min safety rounds and observation. Law enforcement present: Yes ODS 

## 2017-03-08 NOTE — ED Notes (Signed)
BEHAVIORAL HEALTH ROUNDING  Patient sleeping: No.  Patient alert and oriented: yes  Behavior appropriate: Yes. ; If no, describe:  Nutrition and fluids offered: Yes  Toileting and hygiene offered: Yes  Sitter present: not applicable, Q 15 min safety rounds and observation.  Law enforcement present: Yes ODS  

## 2017-03-08 NOTE — ED Notes (Signed)
Pt requesting to use the phone to call his wife who is in the hospital at [facility] in [place]. Explained to pt that he could not use the phone at this time. Pt concerned that his wife is looking for him and does not know where he is and also concerned that she will be discharged from the hospital tomorrow. I offered to call the nursing station and talk to her nurse to inform them he is here if he wanted me to. Pt was agreeable and also ask to have her doctor call and talk to him if the doctor would. This RN called and the information was given to his wife's RN who will relay the information to the patient.

## 2017-03-08 NOTE — ED Provider Notes (Signed)
-----------------------------------------   4:44 PM on 03/08/2017 -----------------------------------------  Pt was medically cleared before my arrival. He has been seen and evaluate by psychiatrist he does not feel he meets criteria for acute inpatient psychiatric admission. Patient has no SI no HI contracts for safety. We will send him home with Keflex for his abrasion to his leg, he also apparently punched a wall and has pain in his hand but x-ray is negative. We'll have him follow up with orthopedic surgery. No wrist pain at this time. Neurologically intact. Patient contracts for safety and we'll discharge him. Extensively advised him not to use cocaine or drink alcohol to excess.   [name] Amor, MD 03/08/17 [phone]

## 2017-03-08 NOTE — ED Provider Notes (Signed)
-----------------------------------------   6:35 AM on 03/08/2017 -----------------------------------------   Blood pressure (!) 160/101, pulse 88, temperature 98.3 F (36.8 C), temperature source Oral, resp. rate 18, height 6' (1.829 m), weight 200 lb (90.7 kg), SpO2 98 %.  The patient had no acute events since last update.  Calm and cooperative at this time.  Disposition is pending Psychiatry/Behavioral Medicine team recommendations.     Paulette Blanch, MD 03/08/17 [phone]

## 2017-03-08 NOTE — ED Notes (Signed)

## 2017-03-09 ENCOUNTER — Inpatient Hospital Stay
Admission: RE | Admit: 2017-03-09 | Discharge: 2017-03-12 | DRG: 885 | Disposition: A | Discharge status: Home / Self Care | Payer: No Typology Code available for payment source | Source: Intra-hospital | Attending: Psychiatry | Admitting: Psychiatry

## 2017-03-09 ENCOUNTER — Emergency Department
Admission: EM | Admit: 2017-03-09 | Discharge: 2017-03-09 | Disposition: A | Discharge status: Other Acute Inpatient Hospital | Payer: No Typology Code available for payment source | Attending: Emergency Medicine | Admitting: Emergency Medicine

## 2017-03-09 DIAGNOSIS — R45851 Suicidal ideations: Secondary | ICD-10-CM | POA: Diagnosis present

## 2017-03-09 DIAGNOSIS — M109 Gout, unspecified: Secondary | ICD-10-CM | POA: Diagnosis present

## 2017-03-09 DIAGNOSIS — Z79899 Other long term (current) drug therapy: Secondary | ICD-10-CM | POA: Diagnosis not present

## 2017-03-09 DIAGNOSIS — Z599 Problem related to housing and economic circumstances, unspecified: Secondary | ICD-10-CM | POA: Diagnosis not present

## 2017-03-09 DIAGNOSIS — F1994 Other psychoactive substance use, unspecified with psychoactive substance-induced mood disorder: Secondary | ICD-10-CM | POA: Diagnosis present

## 2017-03-09 DIAGNOSIS — K219 Gastro-esophageal reflux disease without esophagitis: Secondary | ICD-10-CM | POA: Diagnosis present

## 2017-03-09 DIAGNOSIS — F10120 Alcohol abuse with intoxication, uncomplicated: Secondary | ICD-10-CM | POA: Insufficient documentation

## 2017-03-09 DIAGNOSIS — F32A Depression, unspecified: Secondary | ICD-10-CM

## 2017-03-09 DIAGNOSIS — Z811 Family history of alcohol abuse and dependence: Secondary | ICD-10-CM

## 2017-03-09 DIAGNOSIS — F1020 Alcohol dependence, uncomplicated: Secondary | ICD-10-CM | POA: Diagnosis present

## 2017-03-09 DIAGNOSIS — R4588 Nonsuicidal self-harm: Secondary | ICD-10-CM | POA: Diagnosis present

## 2017-03-09 DIAGNOSIS — Z046 Encounter for general psychiatric examination, requested by authority: Secondary | ICD-10-CM

## 2017-03-09 DIAGNOSIS — G4700 Insomnia, unspecified: Secondary | ICD-10-CM | POA: Diagnosis present

## 2017-03-09 DIAGNOSIS — X789XXA Intentional self-harm by unspecified sharp object, initial encounter: Secondary | ICD-10-CM | POA: Diagnosis present

## 2017-03-09 DIAGNOSIS — Y907 Blood alcohol level of 200-239 mg/100 ml: Secondary | ICD-10-CM | POA: Diagnosis present

## 2017-03-09 DIAGNOSIS — Z7951 Long term (current) use of inhaled steroids: Secondary | ICD-10-CM

## 2017-03-09 DIAGNOSIS — Y929 Unspecified place or not applicable: Secondary | ICD-10-CM | POA: Insufficient documentation

## 2017-03-09 DIAGNOSIS — Y92009 Unspecified place in unspecified non-institutional (private) residence as the place of occurrence of the external cause: Secondary | ICD-10-CM | POA: Diagnosis present

## 2017-03-09 DIAGNOSIS — Y939 Activity, unspecified: Secondary | ICD-10-CM | POA: Insufficient documentation

## 2017-03-09 DIAGNOSIS — Z87891 Personal history of nicotine dependence: Secondary | ICD-10-CM | POA: Diagnosis not present

## 2017-03-09 DIAGNOSIS — I10 Essential (primary) hypertension: Secondary | ICD-10-CM | POA: Diagnosis present

## 2017-03-09 DIAGNOSIS — X788XXA Intentional self-harm by other sharp object, initial encounter: Secondary | ICD-10-CM | POA: Insufficient documentation

## 2017-03-09 DIAGNOSIS — Y999 Unspecified external cause status: Secondary | ICD-10-CM | POA: Insufficient documentation

## 2017-03-09 DIAGNOSIS — M549 Dorsalgia, unspecified: Secondary | ICD-10-CM

## 2017-03-09 DIAGNOSIS — F10920 Alcohol use, unspecified with intoxication, uncomplicated: Secondary | ICD-10-CM

## 2017-03-09 DIAGNOSIS — Z9049 Acquired absence of other specified parts of digestive tract: Secondary | ICD-10-CM | POA: Diagnosis not present

## 2017-03-09 DIAGNOSIS — F332 Major depressive disorder, recurrent severe without psychotic features: Secondary | ICD-10-CM | POA: Diagnosis not present

## 2017-03-09 DIAGNOSIS — S81811A Laceration without foreign body, right lower leg, initial encounter: Secondary | ICD-10-CM | POA: Insufficient documentation

## 2017-03-09 DIAGNOSIS — Z560 Unemployment, unspecified: Secondary | ICD-10-CM

## 2017-03-09 DIAGNOSIS — J449 Chronic obstructive pulmonary disease, unspecified: Secondary | ICD-10-CM | POA: Diagnosis present

## 2017-03-09 DIAGNOSIS — Z915 Personal history of self-harm: Secondary | ICD-10-CM

## 2017-03-09 DIAGNOSIS — F321 Major depressive disorder, single episode, moderate: Secondary | ICD-10-CM

## 2017-03-09 DIAGNOSIS — J45909 Unspecified asthma, uncomplicated: Secondary | ICD-10-CM | POA: Insufficient documentation

## 2017-03-09 DIAGNOSIS — F10220 Alcohol dependence with intoxication, uncomplicated: Secondary | ICD-10-CM | POA: Diagnosis present

## 2017-03-09 DIAGNOSIS — G8929 Other chronic pain: Secondary | ICD-10-CM | POA: Diagnosis present

## 2017-03-09 DIAGNOSIS — F329 Major depressive disorder, single episode, unspecified: Secondary | ICD-10-CM | POA: Insufficient documentation

## 2017-03-09 LAB — COMPREHENSIVE METABOLIC PANEL
ALT: 23 U/L (ref 17–63)
AST: 23 U/L (ref 15–41)
Albumin: 3.1 g/dL — ABNORMAL LOW (ref 3.5–5.0)
Alkaline Phosphatase: 82 U/L (ref 38–126)
Anion gap: 9 (ref 5–15)
BUN: 10 mg/dL (ref 6–20)
CO2: 25 mmol/L (ref 22–32)
Calcium: 8.4 mg/dL — ABNORMAL LOW (ref 8.9–10.3)
Chloride: 111 mmol/L (ref 101–111)
Creatinine, Ser: 0.77 mg/dL (ref 0.61–1.24)
GFR calc Af Amer: >60 mL/min (ref >60)
GFR calc non Af Amer: >60 mL/min (ref >60)
Glucose, Bld: 111 mg/dL — ABNORMAL HIGH (ref 65–99)
Potassium: 3.2 mmol/L — ABNORMAL LOW (ref 3.5–5.1)
Sodium: 145 mmol/L (ref 135–145)
Total Bilirubin: 0.8 mg/dL (ref 0.3–1.2)
Total Protein: 5.6 g/dL — ABNORMAL LOW (ref 6.5–8.1)

## 2017-03-09 LAB — CBC WITH DIFFERENTIAL/PLATELET
Basophils Absolute: 0 10*3/uL (ref 0–0.1)
Basophils Relative: 0 %
Eosinophils Absolute: 0.1 10*3/uL (ref 0–0.7)
Eosinophils Relative: 1 %
HCT: 35.4 % — ABNORMAL LOW (ref 40.0–52.0)
Hemoglobin: 12.1 g/dL — ABNORMAL LOW (ref 13.0–18.0)
Lymphocytes Relative: 27 %
Lymphs Abs: 1.6 10*3/uL (ref 1.0–3.6)
MCH: 31.1 pg (ref 26.0–34.0)
MCHC: 34.2 g/dL (ref 32.0–36.0)
MCV: 91.1 fL (ref 80.0–100.0)
Monocytes Absolute: 0.6 10*3/uL (ref 0.2–1.0)
Monocytes Relative: 10 %
Neutro Abs: 3.7 10*3/uL (ref 1.4–6.5)
Neutrophils Relative %: 62 %
Platelets: 179 10*3/uL (ref 150–440)
RBC: 3.89 MIL/uL — ABNORMAL LOW (ref 4.40–5.90)
RDW: 16.3 % — ABNORMAL HIGH (ref 11.5–14.5)
WBC: 6 10*3/uL (ref 3.8–10.6)

## 2017-03-09 LAB — ETHANOL: Alcohol, Ethyl (B): 200 mg/dL — ABNORMAL HIGH (ref <5)

## 2017-03-09 LAB — ACETAMINOPHEN LEVEL: Acetaminophen (Tylenol), Serum: <10 ug/mL — ABNORMAL LOW (ref 10–30)

## 2017-03-09 LAB — SALICYLATE LEVEL: Salicylate Lvl: <7 mg/dL (ref 2.8–30.0)

## 2017-03-09 MED ORDER — VITAMIN B-1 100 MG PO TABS
100.0000 mg | ORAL_TABLET | Freq: Every day | ORAL | Status: DC
  Administered 2017-03-10 – 2017-03-12 (×3): 100 mg via ORAL
  Filled 2017-03-09 (×3): qty 1

## 2017-03-09 MED ORDER — LISINOPRIL 20 MG PO TABS
20.0000 mg | ORAL_TABLET | Freq: Every day | ORAL | Status: DC
  Administered 2017-03-09: 20 mg via ORAL
  Filled 2017-03-09: qty 2

## 2017-03-09 MED ORDER — ADULT MULTIVITAMIN W/MINERALS CH
1.0000 | ORAL_TABLET | Freq: Every day | ORAL | Status: DC
  Administered 2017-03-10 – 2017-03-12 (×3): 1 via ORAL
  Filled 2017-03-09 (×3): qty 1

## 2017-03-09 MED ORDER — LORAZEPAM 2 MG/ML IJ SOLN: 1.0000 mg | Freq: Four times a day (QID) | INTRAMUSCULAR | Status: DC | PRN

## 2017-03-09 MED ORDER — THIAMINE HCL 100 MG/ML IJ SOLN: 100.0000 mg | Freq: Every day | INTRAMUSCULAR | Status: DC

## 2017-03-09 MED ORDER — LORAZEPAM 1 MG PO TABS
1.0000 mg | ORAL_TABLET | Freq: Four times a day (QID) | ORAL | Status: DC | PRN
  Administered 2017-03-09: 1 mg via ORAL
  Filled 2017-03-09: qty 1

## 2017-03-09 MED ORDER — MAGNESIUM HYDROXIDE 400 MG/5ML PO SUSP: 30.0000 mL | Freq: Every day | ORAL | Status: DC | PRN

## 2017-03-09 MED ORDER — LISINOPRIL 20 MG PO TABS
20.0000 mg | ORAL_TABLET | Freq: Every day | ORAL | Status: DC
  Administered 2017-03-10: 20 mg via ORAL
  Filled 2017-03-09 (×3): qty 1

## 2017-03-09 MED ORDER — ADULT MULTIVITAMIN W/MINERALS CH
1.0000 | ORAL_TABLET | Freq: Every day | ORAL | Status: DC
  Administered 2017-03-09: 1 via ORAL
  Filled 2017-03-09: qty 1

## 2017-03-09 MED ORDER — LORAZEPAM 1 MG PO TABS
1.0000 mg | ORAL_TABLET | Freq: Four times a day (QID) | ORAL | Status: DC | PRN
  Administered 2017-03-10: 1 mg via ORAL
  Filled 2017-03-09: qty 1

## 2017-03-09 MED ORDER — FLUOXETINE HCL 20 MG PO CAPS
20.0000 mg | ORAL_CAPSULE | Freq: Every day | ORAL | Status: DC
  Administered 2017-03-09: 20 mg via ORAL
  Filled 2017-03-09: qty 1

## 2017-03-09 MED ORDER — ACETAMINOPHEN 325 MG PO TABS
650.0000 mg | ORAL_TABLET | Freq: Four times a day (QID) | ORAL | Status: DC | PRN
  Administered 2017-03-11 – 2017-03-12 (×3): 650 mg via ORAL
  Filled 2017-03-09 (×3): qty 2

## 2017-03-09 MED ORDER — FOLIC ACID 1 MG PO TABS
1.0000 mg | ORAL_TABLET | Freq: Every day | ORAL | Status: DC
  Administered 2017-03-10 – 2017-03-12 (×3): 1 mg via ORAL
  Filled 2017-03-09 (×3): qty 1

## 2017-03-09 MED ORDER — ALUM & MAG HYDROXIDE-SIMETH 200-200-20 MG/5ML PO SUSP: 30.0000 mL | ORAL | Status: DC | PRN

## 2017-03-09 MED ORDER — VITAMIN B-1 100 MG PO TABS
100.0000 mg | ORAL_TABLET | Freq: Every day | ORAL | Status: DC
  Administered 2017-03-09: 100 mg via ORAL
  Filled 2017-03-09: qty 1

## 2017-03-09 MED ORDER — FOLIC ACID 1 MG PO TABS
1.0000 mg | ORAL_TABLET | Freq: Every day | ORAL | Status: DC
  Administered 2017-03-09: 1 mg via ORAL
  Filled 2017-03-09: qty 1

## 2017-03-09 MED ORDER — ALBUTEROL SULFATE HFA 108 (90 BASE) MCG/ACT IN AERS
2.0000 | INHALATION_SPRAY | RESPIRATORY_TRACT | Status: DC | PRN
  Filled 2017-03-09: qty 6.7

## 2017-03-09 MED ORDER — FLUOXETINE HCL 20 MG PO CAPS
20.0000 mg | ORAL_CAPSULE | Freq: Every day | ORAL | Status: DC
  Administered 2017-03-10 – 2017-03-12 (×3): 20 mg via ORAL
  Filled 2017-03-09 (×3): qty 1

## 2017-03-09 MED ORDER — PENTAFLUOROPROP-TETRAFLUOROETH EX AERO
INHALATION_SPRAY | CUTANEOUS | Status: AC
  Filled 2017-03-09: qty 30

## 2017-03-09 NOTE — ED Notes (Signed)
Pt requested bigger socks and a warm blanket. Pt given blue socks and blanket.Pt ambulated to bathroom unassisted.

## 2017-03-09 NOTE — ED Notes (Signed)
PT  IV[facility]  SEEN  BY  DR  CLAPACS  PENDING  PLACEMENT 

## 2017-03-09 NOTE — ED Notes (Signed)
Patient is sleeping , no signs of distress, q 15 minute checks and camera surveillance in progress, Nurse will report off to oncoming nurse.

## 2017-03-09 NOTE — BH Assessment (Signed)
Patient is to be admitted to [facility] by Dr. Weber Cooks.  Attending Physician will be Dr. Bary Leriche.   Patient has been assigned to room 324B, by [facility].   ER staff is aware of the admission Holley Raring, ER Sect.; Dr. Kerman Passey, ER MD; Abigail Butts Patient's Nurse & Mya Patient Access).  Pt may transport after 2000.

## 2017-03-09 NOTE — ED Notes (Signed)
Patient refused breakfast , but placed in room

## 2017-03-09 NOTE — ED Notes (Signed)
Patient in bathroom

## 2017-03-09 NOTE — ED Notes (Signed)
Pt. Alert and oriented, warm and dry, in no distress. Pt. Denies SI, HI, and AVH. Pt. Encouraged to let nursing staff know of any concerns or needs. 

## 2017-03-09 NOTE — ED Provider Notes (Signed)
[facility] Emergency Department Provider Note   ____________________________________________   First MD Initiated Contact with Patient 03/09/17 0129     (approximate)  I have reviewed the triage vital signs and the nursing notes.   HISTORY  Chief Complaint Laceration  History limited by intoxication  HPI Miguel [name] is a 56 y.o. male brought to the ED from home via EMS with a chief complaint of alcohol intoxication, depression, IVC and self-inflicted wound. Patient is well-known to the emergency department; was just discharged at 5 PM by psychiatry for similar episode of self-inflicted wounds to leg done on prior visit. Patient reports his wife is scheduled to be discharged from rehabilitation this morning at 6 AM and he is having a hard time dealing with it. EMS reports giving patient 5 mg Haldol and 2 mg Versed via IV for agitation and combativeness.Patient denies knowledge of how he suffered a laceration to his right thigh tonight. Denies active SI/HI/AH/VH at this time. Tearful. Voices no medical complaints.   Past Medical History:  Diagnosis Date  . Alcohol abuse   . Asthma   . GERD (gastroesophageal reflux disease)   . Gout   . Hypertension   . OCD (obsessive compulsive disorder)   . Renal colic     Patient Active Problem List   Diagnosis Date Noted  . Moderate major depression, single episode ([place]) 08/15/2016  . Substance induced mood disorder ([place]) 08/15/2016  . Self-inflicted injury 35/70/1779  . Involuntary commitment 08/15/2016  . Alcohol abuse 02/05/2016  . Hypertension 12/05/2015  . Tachycardia 12/05/2015  . Gout 11/13/2015  . Chronic back pain 05/02/2015    Past Surgical History:  Procedure Laterality Date  . CHOLECYSTECTOMY  2012    Prior to Admission medications   Medication Sig Start Date End Date Taking? Authorizing Provider  albuterol (VENTOLIN HFA) 108 (90 Base) MCG/ACT inhaler Inhale 2 puffs into the lungs every 4  (four) hours as needed for wheezing or shortness of breath.    Historical Provider, MD  allopurinol (ZYLOPRIM) 300 MG tablet Take 300 mg by mouth every morning.    Historical Provider, MD  colchicine 0.6 MG tablet 1 tablet once or twice a day with food Patient not taking: Reported on 02/19/2017 04/30/16 05/31/16  Larene Beach A McGowan, PA-C  dexlansoprazole (DEXILANT) 60 MG capsule Take 60 mg by mouth every morning.    Historical Provider, MD  etodolac (LODINE) 200 MG capsule Take 1 capsule (200 mg total) by mouth every 8 (eight) hours. Patient not taking: Reported on 08/15/2016 07/27/16   Loney Hering, MD  etodolac (LODINE) 200 MG capsule Take 1 capsule (200 mg total) by mouth every 8 (eight) hours. Patient not taking: Reported on 02/19/2017 11/19/16   Loney Hering, MD  FLUoxetine (PROZAC) 20 MG capsule Take 1 capsule (20 mg total) by mouth daily. Patient not taking: Reported on 02/19/2017 08/15/16   Gonzella Lex, MD  Fluticasone-Salmeterol (ADVAIR) 100-50 MCG/DOSE AEPB Inhale 1 puff into the lungs 2 (two) times daily.    Historical Provider, MD  HYDROcodone-acetaminophen (NORCO/VICODIN) 5-325 MG tablet Take 1-2 tablets by mouth every 4 (four) hours as needed for moderate pain. Patient not taking: Reported on 08/15/2016 05/02/16   Hinda Kehr, MD  ibuprofen (ADVIL,MOTRIN) 600 MG tablet Take 1 tablet (600 mg total) by mouth every 6 (six) hours as needed. Patient not taking: Reported on 08/15/2016 12/31/15   Sable Feil, PA-C  ketorolac (TORADOL) 10 MG tablet Take 1 tablet (10  mg total) by mouth every 8 (eight) hours. Patient not taking: Reported on 08/15/2016 07/28/16   Dannielle Karvonen Menshew, PA-C  lidocaine (LIDODERM) 5 % Place 1 patch onto the skin every 12 (twelve) hours. Remove & Discard patch within 12 hours or as directed by MD Patient not taking: Reported on 02/19/2017 07/22/16 07/22/17  Nance Pear, MD  lidocaine (LIDODERM) 5 % Place 1 patch onto the skin every 12 (twelve) hours. Remove & Discard  patch within 12 hours or as directed by MD Patient not taking: Reported on 03/02/2017 02/27/17 02/27/18  Loney Hering, MD  lisinopril (PRINIVIL,ZESTRIL) 20 MG tablet Take 20 mg by mouth every morning.     Historical Provider, MD  LORazepam (ATIVAN) 1 MG tablet Take 1 tablet (1 mg total) by mouth 2 (two) times daily as needed for anxiety. Patient not taking: Reported on 04/30/2016 04/01/16   Daymon Larsen, MD  naproxen (EC NAPROSYN) 500 MG EC tablet Take 1 tablet (500 mg total) by mouth 2 (two) times daily with a meal. Patient not taking: Reported on 08/15/2016 02/13/16   Alita Chyle Bacon Menshew, PA-C  omeprazole (PRILOSEC) 20 MG capsule Take 20 mg by mouth daily.    Historical Provider, MD  ondansetron (ZOFRAN ODT) 4 MG disintegrating tablet Take 1 tablet (4 mg total) by mouth every 8 (eight) hours as needed for nausea or vomiting. Patient not taking: Reported on 02/19/2017 11/19/16   Loney Hering, MD  ondansetron (ZOFRAN) 4 MG tablet Take 1 tablet (4 mg total) by mouth every 8 (eight) hours as needed for nausea or vomiting. Patient not taking: Reported on 02/19/2017 01/26/17   Rudene Re, MD  oxyCODONE-acetaminophen (PERCOCET) 5-325 MG tablet Take 2 tablets by mouth every 6 (six) hours as needed for moderate pain or severe pain. 03/07/17   Earleen Newport, MD  oxyCODONE-acetaminophen (ROXICET) 5-325 MG tablet Take 1 tablet by mouth every 6 (six) hours as needed. Patient not taking: Reported on 08/15/2016 07/27/16   Loney Hering, MD  potassium chloride (K-DUR) 10 MEQ tablet Take 1 tablet (10 mEq total) by mouth daily. Patient not taking: Reported on 08/15/2016 04/30/16   Nori Riis, PA-C    Allergies Patient has no known allergies.  No family history on file.  Social History Social History  Substance Use Topics  . Smoking status: Former Research scientist (life sciences)  . Smokeless tobacco: Never Used  . Alcohol use Yes     Comment: 1 pint of vodka a day    Review of Systems  Constitutional:  No fever/chills. Eyes: No visual changes. ENT: No sore throat. Cardiovascular: Denies chest pain. Respiratory: Denies shortness of breath. Gastrointestinal: No abdominal pain.  No nausea, no vomiting.  No diarrhea.  No constipation. Genitourinary: Negative for dysuria. Musculoskeletal: Positive for right thigh laceration. Negative for back pain. Skin: Negative for rash. Neurological: Negative for headaches, focal weakness or numbness. Psychiatric:Positive for depression and self-harm.   ____________________________________________   PHYSICAL EXAM:  VITAL SIGNS: ED Triage Vitals  Enc Vitals Group     BP 03/09/17 0128 (!) 151/102     Pulse Rate 03/09/17 0128 98     Resp 03/09/17 0128 20     Temp 03/09/17 0128 97.9 F (36.6 C)     Temp Source 03/09/17 0128 Oral     SpO2 03/09/17 0128 99 %     Weight 03/09/17 0128 200 lb (90.7 kg)     Height 03/09/17 0128 6' (1.829 m)     Head  Circumference --      Peak Flow --      Pain Score 03/09/17 0124 8     Pain Loc --      Pain Edu? --      Excl. in [place]? --     Constitutional: Asleep, awakened for exam. Disheveled appearing and in no acute distress. Intoxicated. Eyes: Conjunctivae are bloodshot. PERRL. EOMI. Head: Atraumatic. Nose: No congestion/rhinnorhea. Mouth/Throat: Mucous membranes are moist.  Oropharynx non-erythematous. Neck: No stridor.  No cervical spine tenderness to palpation. Cardiovascular: Normal rate, regular rhythm. Grossly normal heart sounds.  Good peripheral circulation. Respiratory: Normal respiratory effort.  No retractions. Lungs CTAB. Gastrointestinal: Soft and nontender. No distention. No abdominal bruits. No CVA tenderness. Musculoskeletal: Multiple superficial abrasions to right lower inner thigh. Approximately 0.5 cm superficial laceration which is nonbleeding and well approximated. 2+ femoral and distal pulses. Pre-existing sutures to right forearm and right lower calf sustained from self-inflicted  injuries which were repaired yesterday. Neurologic:  Intoxicated. Follows commands. MAEx4. Skin:  Skin is warm, dry and intact. No rash noted. Psychiatric: Mood and affect are tearful. Speech and behavior are normal.  ____________________________________________   LABS (all labs ordered are listed, but only abnormal results are displayed)  Labs Reviewed  CBC WITH DIFFERENTIAL/PLATELET - Abnormal; Notable for the following:       Result Value   RBC 3.89 (*)    Hemoglobin 12.1 (*)    HCT 35.4 (*)    RDW 16.3 (*)    All other components within normal limits  COMPREHENSIVE METABOLIC PANEL - Abnormal; Notable for the following:    Potassium 3.2 (*)    Glucose, Bld 111 (*)    Calcium 8.4 (*)    Total Protein 5.6 (*)    Albumin 3.1 (*)    All other components within normal limits  ETHANOL - Abnormal; Notable for the following:    Alcohol, Ethyl (B) 200 (*)    All other components within normal limits  ACETAMINOPHEN LEVEL - Abnormal; Notable for the following:    Acetaminophen (Tylenol), Serum <10 (*)    All other components within normal limits  SALICYLATE LEVEL   ____________________________________________  EKG  None ____________________________________________  RADIOLOGY  None ____________________________________________   PROCEDURES  Procedure(s) performed: None  Procedures  Critical Care performed: No  ____________________________________________   INITIAL IMPRESSION / ASSESSMENT AND PLAN / ED COURSE  Pertinent labs & imaging results that were available during my care of the patient were reviewed by me and considered in my medical decision making (see chart for details).  56 year old recalcitrant alcoholic who returns to the ED less than 12 hours after psychiatry rescinded his prior IVC for intoxication with self-inflicted injuries. Came with IVC papers. Upon chart review, it does not appear that patient has had lab work done since 4/17. Reviewed urine tox  screen from most recent visit. Will obtain screening toxicological lab work. Tetanus is up-to-date. No suture repair required for superficial leg wound. Will maintain patient's IVC pending TTS and psychiatry consults. Disposition per psychiatry.  Clinical Course as of Mar 09 653  Tue Mar 09, 2017  0653 No further events overnight. Pending psychiatry evaluation this morning.  [JS]    Clinical Course User Index [JS] Paulette Blanch, MD     ____________________________________________   FINAL CLINICAL IMPRESSION(S) / ED DIAGNOSES  Final diagnoses:  Laceration of right lower extremity, initial encounter  Alcoholic intoxication without complication (HCC)  Depression, unspecified depression type      NEW  MEDICATIONS STARTED DURING THIS VISIT:  New Prescriptions   No medications on file     Note:  This document was prepared using Dragon voice recognition software and may include unintentional dictation errors.    Paulette Blanch, MD 03/09/17 [phone]

## 2017-03-09 NOTE — ED Notes (Signed)

## 2017-03-09 NOTE — ED Notes (Signed)
ED BHU PLACEMENT JUSTIFICATION Is the patient under IVC or is there intent for IVC: Yes.   Is the patient medically cleared: Yes.   Is there vacancy in the ED BHU: Yes.   Is the population mix appropriate for patient: Yes.   Is the patient awaiting placement in inpatient or outpatient setting: Yes.   Has the patient had a psychiatric consult: Yes.   Survey of unit performed for contraband, proper placement and condition of furniture, tampering with fixtures in bathroom, shower, and each patient room: Yes.  ; Findings: NA APPEARANCE/BEHAVIOR calm, cooperative and adequate rapport can be established NEURO ASSESSMENT Orientation: time, place and person Hallucinations: No.None noted (Hallucinations) Speech: Normal Gait: normal RESPIRATORY ASSESSMENT Normal expansion.  Clear to auscultation.  No rales, rhonchi, or wheezing. CARDIOVASCULAR ASSESSMENT regular rate and rhythm, S1, S2 normal, no murmur, click, rub or gallop GASTROINTESTINAL ASSESSMENT soft, nontender, BS WNL, no r/g EXTREMITIES normal strength, tone, and muscle mass PLAN OF CARE Provide calm/safe environment. Vital signs assessed twice daily. ED BHU Assessment once each 12-hour shift. Collaborate with intake RN daily or as condition indicates. Assure the ED provider has rounded once each shift. Provide and encourage hygiene. Provide redirection as needed. Assess for escalating behavior; address immediately and inform ED provider.  Assess family dynamic and appropriateness for visitation as needed: Yes.  ; If necessary, describe findings: NA Educate the patient/family about BHU procedures/visitation: Yes.  ; If necessary, describe findings: NA  

## 2017-03-09 NOTE — BH Assessment (Signed)
Pt MRN provided to charge RN (phyills) for chart/placement review.

## 2017-03-09 NOTE — BH Assessment (Signed)
Clinician followed up with charge RN Silva Bandy). RN admitting pt has been unable to assign this pt bed. Clinician informed Abigail Butts, RN that bed is pending assignment.

## 2017-03-09 NOTE — ED Notes (Signed)
BEHAVIORAL HEALTH ROUNDING Patient sleeping: No. Patient alert and oriented: yes - lying in bed  Behavior appropriate: Yes.  ; If no, describe:  Nutrition and fluids offered: yes Toileting and hygiene offered: Yes  Sitter present: q15 minute observations and security monitoring Law enforcement present: Yes  ODS  ENVIRONMENTAL ASSESSMENT Potentially harmful objects out of patient reach: Yes.   Personal belongings secured: Yes.   Patient dressed in hospital provided attire only: Yes.   Plastic bags out of patient reach: Yes.   Patient care equipment (cords, cables, call bells, lines, and drains) shortened, removed, or accounted for: Yes.  Equipment and supplies removed from bottom of stretcher: Yes.   Potentially toxic materials out of patient reach: Yes.   Sharps container removed or out of patient reach: Yes.

## 2017-03-09 NOTE — ED Notes (Signed)
Pt ambulating steadily to the BR with hospital gown on - tech offered hygiene at this time  NAD observed   He then received a phone call from his spouse  [phone]  - he spoke with her and became tearful /sad  Continue to monitor

## 2017-03-09 NOTE — ED Notes (Signed)
BEHAVIORAL HEALTH ROUNDING Patient sleeping: No. Patient alert and oriented: yes Behavior appropriate: Yes.  ; If no, describe:  Nutrition and fluids offered: yes Toileting and hygiene offered: Yes  Sitter present: q15 minute observations and security  monitoring Law enforcement present: Yes  ODS  

## 2017-03-09 NOTE — ED Notes (Signed)
Pt lying on right side sleeping, audible snoring can be heard at this time. Equal and unlabored rise and fall of chest noted.

## 2017-03-09 NOTE — BH Assessment (Signed)
Assessment Note  Miguel [name] is an 56 y.o. male. Miguel [name] arrived to the ED by way of EMS.  He stated that he is unsure what happened this evening.  "I Jasen't know how, I Ociel't know why, I can't remember".  He reports that he had about half a bottle of wine tonight.  He denied symptoms of depression.  He denied symptoms of anxiety.  He denied having auditory or visual hallucinations. He denied having suicidal or homicidal ideation or intent.  He reported that he needs to be able to pick his wife up from the hospital in the morning. IVC paperwork reports "EMS responded to Miguel [name]'s home to find him intoxicated with three cuts to his leg possibly self inflicted with a butcher knife. EMS indicated to Miguel [name] that he would die if he did not go to the hospital. Miguel [name] responded with, "Well, I guess I'll just die." He was just [facility] from [facility] around 5 p.m. from a commitment that was done by the hospital.  He is a known alcoholic and his wife is a drug addict who is scheduled to get out of rehab tomorrow." Miguel [name] was not very cooperative with the assessment process.  He reports that he was drinking wine this evening. BAL has not reported at this time.  Diagnosis: Self injurious behaviors, Alcohol abuse  Past Medical History:  Past Medical History:  Diagnosis Date  . Alcohol abuse   . Asthma   . GERD (gastroesophageal reflux disease)   . Gout   . Hypertension   . OCD (obsessive compulsive disorder)   . Renal colic     Past Surgical History:  Procedure Laterality Date  . CHOLECYSTECTOMY  2012    Family History: No family history on file.  Social History:  reports that he has quit smoking. He has never used smokeless tobacco. He reports that he drinks alcohol. He reports that he does not use drugs.  Additional Social History:  Alcohol / Drug Use History of alcohol / drug use?: Yes Substance #1 Name of Substance 1: Alcohol 1 - Age of First Use: 19 1 - Amount (size/oz): Unknown 1  - Frequency: daily 1 - Last Use / Amount: 03/08/2017  CIWA: CIWA-Ar BP: (!) 151/102 Pulse Rate: 98 COWS:    Allergies: No Known Allergies  Home Medications:  (Not in a hospital admission)  OB/GYN Status:  No LMP for male patient.  General Assessment Data Location of Assessment: [facility] ED TTS Assessment: In system Is this a Tele or Face-to-Face Assessment?: Face-to-Face Is this an Initial Assessment or a Re-assessment for this encounter?: Initial Assessment Marital status: Married [place] name: N/A Is patient pregnant?: No Pregnancy Status: No Living Arrangements: Spouse/significant other Can pt return to current living arrangement?: Yes Admission Status: Involuntary Is patient capable of signing voluntary admission?: Yes Referral Source: Self/Family/Friend Insurance type: Medicaid  Medical Screening Exam ([place]) Medical Exam completed: Yes  Crisis Care Plan Living Arrangements: Spouse/significant other Legal Guardian: Other: (Self) Name of Psychiatrist: None at this time Name of Therapist: None at this time  Education Status Is patient currently in school?: No Current Grade: n/a Highest grade of school patient has completed: 12th grade Name of school: [employer] person: n/a  Risk to self with the past 6 months Suicidal Ideation: No Has patient been a risk to self within the past 6 months prior to admission? : No Suicidal Intent: No Has patient had any suicidal intent within the past  6 months prior to admission? : No Is patient at risk for suicide?: No Suicidal Plan?: No Has patient had any suicidal plan within the past 6 months prior to admission? : No Access to Means: No What has been your use of drugs/alcohol within the last 12 months?: drank half a bottle of wine today Previous Attempts/Gestures: No How many times?: 0 Other Self Harm Risks: Cutting Triggers for Past Attempts: None known Intentional Self Injurious Behavior:  Cutting Family Suicide History: No Recent stressful life event(s): Other (Comment) (Wife's health issues) Persecutory voices/beliefs?: No Depression: Yes Depression Symptoms:  (self injurious behaviors) Substance abuse history and/or treatment for substance abuse?: Yes Suicide prevention information given to non-admitted patients: Not applicable  Risk to Others within the past 6 months Homicidal Ideation: No Does patient have any lifetime risk of violence toward others beyond the six months prior to admission? : No Thoughts of Harm to Others: No Current Homicidal Intent: No Current Homicidal Plan: No Access to Homicidal Means: No Identified Victim: None identified History of harm to others?: No Assessment of Violence: None Noted Does patient have access to weapons?: No Criminal Charges Pending?: No Does patient have a court date: No Is patient on probation?: No  Psychosis Hallucinations: None noted Delusions: None noted  Mental Status Report Appearance/Hygiene: In scrubs, Disheveled Eye Contact: Poor Motor Activity: Unremarkable Speech: Slow, Slurred Level of Consciousness: Quiet/awake Mood: Depressed Affect: Irritable Anxiety Level: None Thought Processes: Flight of Ideas Judgement: Impaired Orientation: Place, Person, Situation Obsessive Compulsive Thoughts/Behaviors: None  Cognitive Functioning Concentration: Fair Memory: Recent Intact IQ: Average Insight: Poor Impulse Control: Poor Appetite: Fair Sleep: Increased Vegetative Symptoms: None  ADLScreening [facility] Assessment Services) Patient's cognitive ability adequate to safely complete daily activities?: Yes Patient able to express need for assistance with ADLs?: Yes Independently performs ADLs?: Yes (appropriate for developmental age)  Prior Inpatient Therapy Prior Inpatient Therapy: No Prior Therapy Dates: n/a Prior Therapy Facilty/Provider(s): n/a Reason for Treatment: n/a  Prior Outpatient  Therapy Prior Outpatient Therapy: No Prior Therapy Dates: n/a Prior Therapy Facilty/Provider(s): n/a Reason for Treatment: n/a Does patient have an ACCT team?: No Does patient have Intensive In-House Services?  : No Does patient have [facility] services? : No Does patient have P4CC services?: No  ADL Screening (condition at time of admission) Patient's cognitive ability adequate to safely complete daily activities?: Yes Patient able to express need for assistance with ADLs?: Yes Independently performs ADLs?: Yes (appropriate for developmental age)       Abuse/Neglect Assessment (Assessment to be complete while patient is alone) Physical Abuse: Denies Verbal Abuse: Denies Sexual Abuse: Denies Exploitation of patient/patient's resources: Denies Self-Neglect: Denies     Regulatory affairs officer (For Healthcare) Does Patient Have a Medical Advance Directive?: No Would patient like information on creating a medical advance directive?: No - Patient declined    Additional Information 1:1 In Past 12 Months?: No CIRT Risk: No Elopement Risk: No Does patient have medical clearance?: No     Disposition:  Disposition Initial Assessment Completed for this Encounter: Yes Disposition of Patient: Other dispositions  On Site Evaluation by:   Reviewed with Physician:    Elmer Bales 03/09/2017 2:22 AM

## 2017-03-09 NOTE — ED Notes (Signed)
No am meds ordered at this time  IVC  Psych consult pending   Assessment completed  Continue to monitor

## 2017-03-09 NOTE — Consult Note (Signed)
[facility] Consult   Reason for Consult:  Consult for 56 year old man who came to the emergency room having cut himself on the leg once again intoxicated Referring Physician:  Eual Fines Patient Identification: Miguel [name] MRN:  [id number] Principal Diagnosis: Moderate major depression, single episode [facility]) Diagnosis:   Patient Active Problem List   Diagnosis Date Noted  . Moderate major depression, single episode ([place]) [F32.1] 08/15/2016  . Substance induced mood disorder ([place]) [F19.94] 08/15/2016  . Self-inflicted injury [U20.25] 08/15/2016  . Involuntary commitment [Z04.6] 08/15/2016  . Alcohol abuse [F10.10] 02/05/2016  . Hypertension [I10] 12/05/2015  . Tachycardia [R00.0] 12/05/2015  . Gout [M10.9] 11/13/2015  . Chronic back pain [M54.9, G89.29] 05/02/2015    Total Time spent with patient: 1 hour  Subjective:   Miguel [name] is a 56 y.o. male patient admitted with "I got home and just had a little bit to drink".  HPI:  Patient interviewed chart reviewed. Patient familiar in that this is the third time within about 24 hours that he has been in the emergency room with identical circumstances. He was discharged yesterday afternoon and within a few hours had come back to the emergency room. Once again he was intoxicated and had cut himself again. This time cut himself up multiple times on his leg. Patient says after leaving yesterday he went home and was tidying up the house. He remembers that he had "a little bit" to drink. He knows he was cutting himself on the leg. Patient continues to complain of depressed mood and sadness all of which is focused around his wife. Wife apparently has been in rehabilitation for a couple weeks but was scheduled potentially to come home today. Patient himself appears to be confused about exactly what his feelings are about that. Sometimes he seems to be depressed about being away from her and sometimes depressed about having her come back.  Denies that he's been abusing any other drugs.  Social history: Patient and his wife live together although she's been in rehabilitation somewhere in [place] for the last couple weeks. He is not working outside the home.  Medical history: This is the third time he's come in to the emergency room in the last day having cut himself up while intoxicated. He did not require any sutures but on one occasion he got a puncture wound from a small screwdriver in his leg.  Substance abuse history: Patient has alcohol dependence multiple presentations intoxicated. Clearly gets impulsive and dangerous when he is drinking. Eyes any history of withdrawal seizures or delirium tremens. Not apparently abusing any other drugs currently. He is vague about how much time he has ever been able to stay sober.  Past Psychiatric History: Past history of multiple presentations intoxicated and with suicidality and self injury in the past while drinking.  Risk to Self: Suicidal Ideation: No Suicidal Intent: No Is patient at risk for suicide?: No Suicidal Plan?: No Access to Means: No What has been your use of drugs/alcohol within the last 12 months?: drank half a bottle of wine today How many times?: 0 Other Self Harm Risks: Cutting Triggers for Past Attempts: None known Intentional Self Injurious Behavior: Cutting Risk to Others: Homicidal Ideation: No Thoughts of Harm to Others: No Current Homicidal Intent: No Current Homicidal Plan: No Access to Homicidal Means: No Identified Victim: None identified History of harm to others?: No Assessment of Violence: None Noted Does patient have access to weapons?: No Criminal Charges Pending?: No Does patient have  a court date: No Prior Inpatient Therapy: Prior Inpatient Therapy: No Prior Therapy Dates: n/a Prior Therapy Facilty/Provider(s): n/a Reason for Treatment: n/a Prior Outpatient Therapy: Prior Outpatient Therapy: No Prior Therapy Dates: n/a Prior Therapy  Facilty/Provider(s): n/a Reason for Treatment: n/a Does patient have an ACCT team?: No Does patient have Intensive In-House Services?  : No Does patient have [facility] services? : No Does patient have P4CC services?: No  Past Medical History:  Past Medical History:  Diagnosis Date  . Alcohol abuse   . Asthma   . GERD (gastroesophageal reflux disease)   . Gout   . Hypertension   . OCD (obsessive compulsive disorder)   . Renal colic     Past Surgical History:  Procedure Laterality Date  . CHOLECYSTECTOMY  2012   Family History: No family history on file. Family Psychiatric  History: Positive for alcohol abuse Social History:  History  Alcohol Use  . Yes    Comment: 1 pint of vodka a day     History  Drug Use No    Social History   Social History  . Marital status: Married    Spouse name: N/A  . Number of children: N/A  . Years of education: N/A   Social History Main Topics  . Smoking status: Former Research scientist (life sciences)  . Smokeless tobacco: Never Used  . Alcohol use Yes     Comment: 1 pint of vodka a day  . Drug use: No  . Sexual activity: Not Asked   Other Topics Concern  . None   Social History Narrative  . None   Additional Social History:    Allergies:  No Known Allergies  Labs:  Results for orders placed or performed during the hospital encounter of 03/09/17 (from the past 48 hour(s))  CBC with Differential     Status: Abnormal   Collection Time: 03/09/17  1:37 AM  Result Value Ref Range   WBC 6.0 3.8 - 10.6 K/uL   RBC 3.89 (L) 4.40 - 5.90 MIL/uL   Hemoglobin 12.1 (L) 13.0 - 18.0 g/dL   HCT 35.4 (L) 40.0 - 52.0 %   MCV 91.1 80.0 - 100.0 fL   MCH 31.1 26.0 - 34.0 pg   MCHC 34.2 32.0 - 36.0 g/dL   RDW 16.3 (H) 11.5 - 14.5 %   Platelets 179 150 - 440 K/uL   Neutrophils Relative % 62 %   Neutro Abs 3.7 1.4 - 6.5 K/uL   Lymphocytes Relative 27 %   Lymphs Abs 1.6 1.0 - 3.6 K/uL   Monocytes Relative 10 %   Monocytes Absolute 0.6 0.2 - 1.0 K/uL   Eosinophils  Relative 1 %   Eosinophils Absolute 0.1 0 - 0.7 K/uL   Basophils Relative 0 %   Basophils Absolute 0.0 0 - 0.1 K/uL  Comprehensive metabolic panel     Status: Abnormal   Collection Time: 03/09/17  1:37 AM  Result Value Ref Range   Sodium 145 135 - 145 mmol/L   Potassium 3.2 (L) 3.5 - 5.1 mmol/L   Chloride 111 101 - 111 mmol/L   CO2 25 22 - 32 mmol/L   Glucose, Bld 111 (H) 65 - 99 mg/dL   BUN 10 6 - 20 mg/dL   Creatinine, Ser 0.77 0.61 - 1.24 mg/dL   Calcium 8.4 (L) 8.9 - 10.3 mg/dL   Total Protein 5.6 (L) 6.5 - 8.1 g/dL   Albumin 3.1 (L) 3.5 - 5.0 g/dL   AST 23 15 - 41  U/L   ALT 23 17 - 63 U/L   Alkaline Phosphatase 82 38 - 126 U/L   Total Bilirubin 0.8 0.3 - 1.2 mg/dL   GFR calc non Af Amer >60 >60 mL/min   GFR calc Af Amer >60 >60 mL/min    Comment: (NOTE) The eGFR has been calculated using the CKD EPI equation. This calculation has not been validated in all clinical situations. eGFR's persistently <60 mL/min signify possible Chronic Kidney Disease.    Anion gap 9 5 - 15  Ethanol     Status: Abnormal   Collection Time: 03/09/17  1:37 AM  Result Value Ref Range   Alcohol, Ethyl (B) 200 (H) <5 mg/dL    Comment:        LOWEST DETECTABLE LIMIT FOR SERUM ALCOHOL IS 5 mg/dL FOR MEDICAL PURPOSES ONLY   Acetaminophen level     Status: Abnormal   Collection Time: 03/09/17  1:37 AM  Result Value Ref Range   Acetaminophen (Tylenol), Serum <10 (L) 10 - 30 ug/mL    Comment:        THERAPEUTIC CONCENTRATIONS VARY SIGNIFICANTLY. A RANGE OF 10-30 ug/mL MAY BE AN EFFECTIVE CONCENTRATION FOR MANY PATIENTS. HOWEVER, SOME ARE BEST TREATED AT CONCENTRATIONS OUTSIDE THIS RANGE. ACETAMINOPHEN CONCENTRATIONS >150 ug/mL AT 4 HOURS AFTER INGESTION AND >50 ug/mL AT 12 HOURS AFTER INGESTION ARE OFTEN ASSOCIATED WITH TOXIC REACTIONS.   Salicylate level     Status: None   Collection Time: 03/09/17  1:37 AM  Result Value Ref Range   Salicylate Lvl <6.2 2.8 - 30.0 mg/dL    Current  Facility-Administered Medications  Medication Dose Route Frequency Provider Last Rate Last Dose  . albuterol (PROVENTIL HFA;VENTOLIN HFA) 108 (90 Base) MCG/ACT inhaler 2 puff  2 puff Inhalation Q4H PRN Gonzella Lex, MD      . FLUoxetine (PROZAC) capsule 20 mg  20 mg Oral Daily Gonzella Lex, MD   20 mg at 03/09/17 1449  . folic acid (FOLVITE) tablet 1 mg  1 mg Oral Daily Gonzella Lex, MD   1 mg at 03/09/17 1449  . lisinopril (PRINIVIL,ZESTRIL) tablet 20 mg  20 mg Oral Daily Gonzella Lex, MD   20 mg at 03/09/17 1447  . LORazepam (ATIVAN) tablet 1 mg  1 mg Oral Q6H PRN Gonzella Lex, MD   1 mg at 03/09/17 1450   Or  . LORazepam (ATIVAN) injection 1 mg  1 mg Intravenous Q6H PRN Gonzella Lex, MD      . multivitamin with minerals tablet 1 tablet  1 tablet Oral Daily Gonzella Lex, MD   1 tablet at 03/09/17 1449  . thiamine (VITAMIN B-1) tablet 100 mg  100 mg Oral Daily Gonzella Lex, MD   100 mg at 03/09/17 1448   Or  . thiamine (B-1) injection 100 mg  100 mg Intravenous Daily Gonzella Lex, MD       Current Outpatient Prescriptions  Medication Sig Dispense Refill  . oxyCODONE-acetaminophen (PERCOCET) 5-325 MG tablet Take 2 tablets by mouth every 6 (six) hours as needed for moderate pain or severe pain. 20 tablet 0  . albuterol (VENTOLIN HFA) 108 (90 Base) MCG/ACT inhaler Inhale 2 puffs into the lungs every 4 (four) hours as needed for wheezing or shortness of breath.    . allopurinol (ZYLOPRIM) 300 MG tablet Take 300 mg by mouth every morning.    . colchicine 0.6 MG tablet 1 tablet once or twice a day with  food (Patient not taking: Reported on 02/19/2017) 40 tablet 2  . dexlansoprazole (DEXILANT) 60 MG capsule Take 60 mg by mouth every morning.    . etodolac (LODINE) 200 MG capsule Take 1 capsule (200 mg total) by mouth every 8 (eight) hours. (Patient not taking: Reported on 08/15/2016) 12 capsule 0  . etodolac (LODINE) 200 MG capsule Take 1 capsule (200 mg total) by mouth every 8 (eight)  hours. (Patient not taking: Reported on 02/19/2017) 12 capsule 0  . FLUoxetine (PROZAC) 20 MG capsule Take 1 capsule (20 mg total) by mouth daily. (Patient not taking: Reported on 02/19/2017) 30 capsule 1  . Fluticasone-Salmeterol (ADVAIR) 100-50 MCG/DOSE AEPB Inhale 1 puff into the lungs 2 (two) times daily.    Marland Kitchen HYDROcodone-acetaminophen (NORCO/VICODIN) 5-325 MG tablet Take 1-2 tablets by mouth every 4 (four) hours as needed for moderate pain. (Patient not taking: Reported on 08/15/2016) 15 tablet 0  . ibuprofen (ADVIL,MOTRIN) 600 MG tablet Take 1 tablet (600 mg total) by mouth every 6 (six) hours as needed. (Patient not taking: Reported on 08/15/2016) 30 tablet 0  . ketorolac (TORADOL) 10 MG tablet Take 1 tablet (10 mg total) by mouth every 8 (eight) hours. (Patient not taking: Reported on 08/15/2016) 15 tablet 0  . lidocaine (LIDODERM) 5 % Place 1 patch onto the skin every 12 (twelve) hours. Remove & Discard patch within 12 hours or as directed by MD (Patient not taking: Reported on 02/19/2017) 10 patch 0  . lidocaine (LIDODERM) 5 % Place 1 patch onto the skin every 12 (twelve) hours. Remove & Discard patch within 12 hours or as directed by MD (Patient not taking: Reported on 03/02/2017) 10 patch 0  . lisinopril (PRINIVIL,ZESTRIL) 20 MG tablet Take 20 mg by mouth every morning.     Marland Kitchen LORazepam (ATIVAN) 1 MG tablet Take 1 tablet (1 mg total) by mouth 2 (two) times daily as needed for anxiety. (Patient not taking: Reported on 04/30/2016) 10 tablet 0  . naproxen (EC NAPROSYN) 500 MG EC tablet Take 1 tablet (500 mg total) by mouth 2 (two) times daily with a meal. (Patient not taking: Reported on 08/15/2016) 30 tablet 0  . omeprazole (PRILOSEC) 20 MG capsule Take 20 mg by mouth daily.    . ondansetron (ZOFRAN ODT) 4 MG disintegrating tablet Take 1 tablet (4 mg total) by mouth every 8 (eight) hours as needed for nausea or vomiting. (Patient not taking: Reported on 02/19/2017) 20 tablet 0  . ondansetron (ZOFRAN) 4 MG  tablet Take 1 tablet (4 mg total) by mouth every 8 (eight) hours as needed for nausea or vomiting. (Patient not taking: Reported on 02/19/2017) 20 tablet 0  . oxyCODONE-acetaminophen (ROXICET) 5-325 MG tablet Take 1 tablet by mouth every 6 (six) hours as needed. (Patient not taking: Reported on 08/15/2016) 12 tablet 0  . potassium chloride (K-DUR) 10 MEQ tablet Take 1 tablet (10 mEq total) by mouth daily. (Patient not taking: Reported on 08/15/2016) 90 tablet 0    Musculoskeletal: Strength & Muscle Tone: within normal limits Gait & Station: normal Patient leans: N/A  Psychiatric Specialty Exam: Physical Exam  Nursing note and vitals reviewed. Constitutional: He appears well-developed and well-nourished.  HENT:  Head: Normocephalic and atraumatic.  Eyes: Conjunctivae are normal. Pupils are equal, round, and reactive to light.  Neck: Normal range of motion.  Cardiovascular: Regular rhythm and normal heart sounds.   Respiratory: Effort normal. No respiratory distress.  GI: Soft.  Musculoskeletal: Normal range of motion.  Neurological: He  is alert.  Skin: Skin is warm and dry.  Psychiatric: His affect is blunt. His speech is delayed. He is slowed and withdrawn. Thought content is not paranoid. Cognition and memory are impaired. He expresses impulsivity and inappropriate judgment. He exhibits a depressed mood. He expresses no homicidal and no suicidal ideation. He exhibits abnormal recent memory.    Review of Systems  Constitutional: Negative.   HENT: Negative.   Eyes: Negative.   Respiratory: Negative.   Cardiovascular: Negative.   Gastrointestinal: Negative.   Musculoskeletal: Negative.   Skin: Negative.   Neurological: Negative.   Psychiatric/Behavioral: Positive for depression, memory loss and substance abuse. Negative for hallucinations and suicidal ideas. The patient is nervous/anxious and has insomnia.     Blood pressure 124/80, pulse 88, temperature 97.6 F (36.4 C), temperature  source Oral, resp. rate 18, height 6' (1.829 m), weight 90.7 kg (200 lb), SpO2 100 %.Body mass index is 27.12 kg/m.  General Appearance: Disheveled  Eye Contact:  Fair  Speech:  Slow  Volume:  Decreased  Mood:  Depressed  Affect:  Constricted and Depressed  Thought Process:  Goal Directed  Orientation:  Full (Time, Place, and Person)  Thought Content:  Illogical  Suicidal Thoughts:  Yes.  with intent/plan  Homicidal Thoughts:  No  Memory:  Immediate;   Fair Recent;   Poor Remote;   Poor  Judgement:  Impaired  Insight:  Lacking  Psychomotor Activity:  Decreased  Concentration:  Concentration: Poor  Recall:  [employer] of Knowledge:  Fair  Language:  Fair  Akathisia:  No  Handed:  Right  AIMS (if indicated):     Assets:  Housing Physical Health Resilience  ADL's:  Intact  Cognition:  Impaired,  Mild  Sleep:        Treatment Plan Summary: Daily contact with patient to assess and evaluate symptoms and progress in treatment, Medication management and Plan 56 year old man presents to the emergency room having once again cut himself up with a knife all intoxicated. Given that this is the third time in a day that he has done the same thing and that it only took him a few hours to come back to the emergency room last night it seems more appropriate to admit him to the psychiatry ward. Yesterday he had seem to sober up and was saying the right things about staying away from alcohol and not wanting to hurt himself and it took him only a few hours to be back in the ER. I am recommending that we admit this depressed man to the psychiatric ward. 15 minute checks. Detox orders in place. Continue antidepressant. Case reviewed with emergency room doctor. Continue involuntary commitment.  Disposition: Recommend psychiatric Inpatient admission when medically cleared. Supportive therapy provided about ongoing stressors.  Alethia Berthold, MD 03/09/2017 4:47 PM

## 2017-03-09 NOTE — ED Notes (Signed)
Pt sleeping at this time, equal rise and fall of chest noted.  

## 2017-03-09 NOTE — ED Triage Notes (Addendum)
Per EMS, pt was found at home with laceration to right knee. Pt states "I do not know what happened, I Damon't know why I cut myself" when asked about where laceration came from. Pt states he was seen here earlier for same, laceration noted to right lower leg that is wrapped. EMS reports knife was on scene. Pt also has sutures and old bandage to right forearm and sutures with bandage to right lower calf. Officers are with pt at this time. Pt tearful at this time. Pt states "I miss my wife," pt states "my wife is in hospital at [facility]." Pt denies SI/HI at this time. EMS reports giving pt 5 of haldol and 2 of versed through IV. Officers report IVC paperwork is in process.

## 2017-03-09 NOTE — ED Notes (Signed)
Report called to [facility] in BMU. Patient aware of transfer and is in agreement.

## 2017-03-09 NOTE — ED Notes (Signed)
Patient has sutures to right  forearm and sutures to left lower leg from self inflicted wound, no drainage noted, dry and intact.

## 2017-03-09 NOTE — Tx Team (Signed)
Initial Treatment Plan 03/09/2017 9:21 PM Miguel [name] FWB:[id number]    PATIENT STRESSORS: Marital or family conflict Substance abuse   PATIENT STRENGTHS: Ability for insight Communication skills General fund of knowledge Motivation for treatment/growth   PATIENT IDENTIFIED PROBLEMS:     Depression  Substance Abuse               DISCHARGE CRITERIA:  Improved stabilization in mood, thinking, and/or behavior  PRELIMINARY DISCHARGE PLAN: Outpatient therapy  PATIENT/FAMILY INVOLVEMENT: This treatment plan has been presented to and reviewed with the patient, Miguel [name], and/or family member.  The patient and family have been given the opportunity to ask questions and make suggestions.  Scherrie November, RN 03/09/2017, 9:21 PM

## 2017-03-09 NOTE — BH Assessment (Signed)
Clinician consulted with Dr.Clapacs and pt is recommended for inpatient admission.

## 2017-03-09 NOTE — ED Provider Notes (Signed)
-----------------------------------------   5:30 PM on 03/09/2017 -----------------------------------------  Patient has been seen and evaluated by psychiatry they'll be admitted to the service for further treatment.   Harvest Dark, MD 03/09/17 1730

## 2017-03-09 NOTE — ED Notes (Signed)
PT IVC PENDING PSYCH CONSULT. 

## 2017-03-09 NOTE — ED Notes (Signed)
ED [facility] [place] Is the patient under IVC or is there intent for IVC: Yes.   Is the patient medically cleared: Yes.   Is there vacancy in the ED BHU: Yes.   Is the population mix appropriate for patient: Yes.   Is the patient awaiting placement in inpatient or outpatient setting:  Has the patient had a psychiatric consult:  Consult pending  Survey of unit performed for contraband, proper placement and condition of furniture, tampering with fixtures in bathroom, shower, and each patient room: Yes.  ; Findings:  APPEARANCE/BEHAVIOR Calm and cooperative NEURO ASSESSMENT Orientation: oriented x 4  Denies pain Hallucinations: No.None noted (Hallucinations) Speech: Normal Gait: normal RESPIRATORY ASSESSMENT Even  Unlabored respirations  CARDIOVASCULAR ASSESSMENT Pulses equal   regular rate  Skin warm and dry   GASTROINTESTINAL ASSESSMENT no GI complaint EXTREMITIES Full ROM  PLAN OF CARE Provide calm/safe environment. Vital signs assessed twice daily. ED BHU Assessment once each 12-hour shift. Collaborate with TTS daily or as condition indicates. Assure the ED provider has rounded once each shift. Provide and encourage hygiene. Provide redirection as needed. Assess for escalating behavior; address immediately and inform ED provider.  Assess family dynamic and appropriateness for visitation as needed: Yes.  ; If necessary, describe findings:  Educate the patient/family about BHU procedures/visitation: Yes.  ; If necessary, describe findings:

## 2017-03-09 NOTE — ED Notes (Signed)
Pt denies ingestion of alcohol PTA at this time.

## 2017-03-09 NOTE — ED Notes (Signed)
Nurse talked to Patient about his alcohol use and depression, he states that his wife was going to come home today from rehab that she has MS, and He had cleaned the house and was excited that she was going to be home, but then he thinks he got stressed out thinking how he would be her caretaker, and he started to drink, and now she is coming from [facility] facility to a facility in [place], nurse ask him he was also feeling guilty about drinking and He states " yes he was and that is why He started cutting himself, patient denies Si/hi at this time, contracts for safety,nurse talked to him about hope and the fact that his wife will be getting more rehab and hopefully she can improve with her walking, Patient stopped crying and said that he was ready to watch TV. Patient with q 15 minute checks and camera surveillance in progress.

## 2017-03-09 NOTE — ED Notes (Signed)
Lunch was given to patient 

## 2017-03-09 NOTE — BH Assessment (Signed)
Pt cooperative and outfitted in scrubs. Pt states he is unable to recall yesterday's events.Pt does not remember cutting himself. Pt denies HI/SI/psychosis. Pt also denying depressive sxs. Pt states he hopes to be discharged today because his wife is due to be discharged home to him.

## 2017-03-09 NOTE — ED Notes (Signed)
Dr.clapaac in room with patient

## 2017-03-10 ENCOUNTER — Encounter: Payer: Self-pay | Admitting: Psychiatry

## 2017-03-10 DIAGNOSIS — R4588 Nonsuicidal self-harm: Secondary | ICD-10-CM | POA: Diagnosis present

## 2017-03-10 DIAGNOSIS — X789XXA Intentional self-harm by unspecified sharp object, initial encounter: Secondary | ICD-10-CM | POA: Diagnosis present

## 2017-03-10 DIAGNOSIS — Y92009 Unspecified place in unspecified non-institutional (private) residence as the place of occurrence of the external cause: Secondary | ICD-10-CM | POA: Diagnosis present

## 2017-03-10 DIAGNOSIS — F332 Major depressive disorder, recurrent severe without psychotic features: Principal | ICD-10-CM

## 2017-03-10 MED ORDER — RISPERIDONE 1 MG PO TABS
2.0000 mg | ORAL_TABLET | Freq: Two times a day (BID) | ORAL | Status: DC
  Administered 2017-03-10 – 2017-03-12 (×4): 2 mg via ORAL
  Filled 2017-03-10 (×4): qty 2

## 2017-03-10 MED ORDER — CHLORDIAZEPOXIDE HCL 25 MG PO CAPS
25.0000 mg | ORAL_CAPSULE | Freq: Four times a day (QID) | ORAL | Status: DC
  Administered 2017-03-10 – 2017-03-12 (×9): 25 mg via ORAL
  Filled 2017-03-10 (×9): qty 1

## 2017-03-10 MED ORDER — TRAZODONE HCL 100 MG PO TABS
100.0000 mg | ORAL_TABLET | Freq: Every day | ORAL | Status: DC
  Administered 2017-03-10: 100 mg via ORAL
  Filled 2017-03-10: qty 1

## 2017-03-10 MED ORDER — HYDROCHLOROTHIAZIDE 25 MG PO TABS
25.0000 mg | ORAL_TABLET | Freq: Every day | ORAL | Status: DC
  Administered 2017-03-10: 25 mg via ORAL
  Filled 2017-03-10 (×2): qty 1

## 2017-03-10 NOTE — Plan of Care (Signed)
Problem: Coping: Goal: Ability to verbalize feelings will improve Outcome: Progressing Pt focused on discharge to care for his wife. Denies SI/HI/AVH. Denies withdrawal symptoms.

## 2017-03-10 NOTE — Tx Team (Signed)
Interdisciplinary Treatment and Diagnostic Plan Update  03/10/2017 Time of Session: 10:30 AM Miguel [name] MRN: [id number]  Principal Diagnosis: Severe recurrent major 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 ([place])  Secondary Diagnoses: Principal Problem:   Severe recurrent major 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 ([place]) Active Problems:   Hypertension   Gout   Alcohol use disorder, severe, dependence ([place])   Substance induced mood disorder ([place])   Involuntary commitment   Suicide and self-inflicted injury by cutting and piercing instrument ([place])   Current Medications:  Current Facility-Administered Medications  Medication Dose Route Frequency Provider Last Rate Last Dose  . acetaminophen (TYLENOL) tablet 650 mg  650 mg Oral Q6H PRN Gonzella Lex, MD      . albuterol (PROVENTIL HFA;VENTOLIN HFA) 108 (90 Base) MCG/ACT inhaler 2 puff  2 puff Inhalation Q4H PRN Gonzella Lex, MD      . alum & mag hydroxide-simeth (MAALOX/MYLANTA) 200-200-20 MG/5ML suspension 30 mL  30 mL Oral Q4H PRN Gonzella Lex, MD      . chlordiazePOXIDE (LIBRIUM) capsule 25 mg  25 mg Oral QID Jolanta B Pucilowska, MD   25 mg at 03/10/17 1140  . FLUoxetine (PROZAC) capsule 20 mg  20 mg Oral Daily Gonzella Lex, MD   20 mg at 03/10/17 0831  . folic acid (FOLVITE) tablet 1 mg  1 mg Oral Daily Gonzella Lex, MD   1 mg at 03/10/17 0831  . hydrochlorothiazide (HYDRODIURIL) tablet 25 mg  25 mg Oral Daily Jolanta B Pucilowska, MD   25 mg at 03/10/17 1140  . lisinopril (PRINIVIL,ZESTRIL) tablet 20 mg  20 mg Oral Daily Gonzella Lex, MD   20 mg at 03/10/17 0831  . magnesium hydroxide (MILK OF MAGNESIA) suspension 30 mL  30 mL Oral Daily PRN Gonzella Lex, MD      . multivitamin with minerals tablet 1 tablet  1 tablet Oral Daily Gonzella Lex, MD   1 tablet at 03/10/17 0831  . risperiDONE (RISPERDAL) tablet 2 mg  2 mg Oral BID Jolanta B Pucilowska, MD      . thiamine (VITAMIN B-1) tablet 100 mg  100 mg Oral Daily Gonzella Lex, MD   100 mg at 03/10/17 0831   Or  . thiamine (B-1) injection 100 mg  100 mg Intravenous Daily Gonzella Lex, MD      . traZODone (DESYREL) tablet 100 mg  100 mg Oral QHS Jolanta B Pucilowska, MD       PTA Medications: Prescriptions Prior to Admission  Medication Sig Dispense Refill Last Dose  . albuterol (VENTOLIN HFA) 108 (90 Base) MCG/ACT inhaler Inhale 2 puffs into the lungs every 4 (four) hours as needed for wheezing or shortness of breath.   Not Taking at Unknown time  . allopurinol (ZYLOPRIM) 300 MG tablet Take 300 mg by mouth every morning.   Not Taking at Unknown time  . colchicine 0.6 MG tablet 1 tablet once or twice a day with food (Patient not taking: Reported on 02/19/2017) 40 tablet 2 Not Taking at Unknown time  . dexlansoprazole (DEXILANT) 60 MG capsule Take 60 mg by mouth every morning.   Not Taking at Unknown time  . etodolac (LODINE) 200 MG capsule Take 1 capsule (200 mg total) by mouth every 8 (eight) hours. (Patient not taking: Reported on 08/15/2016) 12 capsule 0 Not Taking at Unknown time  . etodolac (LODINE) 200 MG capsule Take 1 capsule (200 mg total) by mouth every 8 (eight) hours. (  Patient not taking: Reported on 02/19/2017) 12 capsule 0 Not Taking at Unknown time  . FLUoxetine (PROZAC) 20 MG capsule Take 1 capsule (20 mg total) by mouth daily. (Patient not taking: Reported on 02/19/2017) 30 capsule 1 Not Taking at Unknown time  . Fluticasone-Salmeterol (ADVAIR) 100-50 MCG/DOSE AEPB Inhale 1 puff into the lungs 2 (two) times daily.   Not Taking at Unknown time  . HYDROcodone-acetaminophen (NORCO/VICODIN) 5-325 MG tablet Take 1-2 tablets by mouth every 4 (four) hours as needed for moderate pain. (Patient not taking: Reported on 08/15/2016) 15 tablet 0 Not Taking at Unknown time  . ibuprofen (ADVIL,MOTRIN) 600 MG tablet Take 1 tablet (600 mg total) by mouth every 6 (six) hours as needed. (Patient not taking: Reported on 08/15/2016) 30 tablet 0 Not Taking at Unknown time  .  ketorolac (TORADOL) 10 MG tablet Take 1 tablet (10 mg total) by mouth every 8 (eight) hours. (Patient not taking: Reported on 08/15/2016) 15 tablet 0 Not Taking at Unknown time  . lidocaine (LIDODERM) 5 % Place 1 patch onto the skin every 12 (twelve) hours. Remove & Discard patch within 12 hours or as directed by MD (Patient not taking: Reported on 02/19/2017) 10 patch 0 Not Taking at Unknown time  . lidocaine (LIDODERM) 5 % Place 1 patch onto the skin every 12 (twelve) hours. Remove & Discard patch within 12 hours or as directed by MD (Patient not taking: Reported on 03/02/2017) 10 patch 0 Not Taking  . lisinopril (PRINIVIL,ZESTRIL) 20 MG tablet Take 20 mg by mouth every morning.    Not Taking at Unknown time  . LORazepam (ATIVAN) 1 MG tablet Take 1 tablet (1 mg total) by mouth 2 (two) times daily as needed for anxiety. (Patient not taking: Reported on 04/30/2016) 10 tablet 0 Not Taking at Unknown time  . naproxen (EC NAPROSYN) 500 MG EC tablet Take 1 tablet (500 mg total) by mouth 2 (two) times daily with a meal. (Patient not taking: Reported on 08/15/2016) 30 tablet 0 Not Taking at Unknown time  . omeprazole (PRILOSEC) 20 MG capsule Take 20 mg by mouth daily.   Not Taking at Unknown time  . ondansetron (ZOFRAN ODT) 4 MG disintegrating tablet Take 1 tablet (4 mg total) by mouth every 8 (eight) hours as needed for nausea or vomiting. (Patient not taking: Reported on 02/19/2017) 20 tablet 0 Not Taking at Unknown time  . ondansetron (ZOFRAN) 4 MG tablet Take 1 tablet (4 mg total) by mouth every 8 (eight) hours as needed for nausea or vomiting. (Patient not taking: Reported on 02/19/2017) 20 tablet 0 Not Taking at Unknown time  . oxyCODONE-acetaminophen (PERCOCET) 5-325 MG tablet Take 2 tablets by mouth every 6 (six) hours as needed for moderate pain or severe pain. 20 tablet 0 unknown at unknown  . oxyCODONE-acetaminophen (ROXICET) 5-325 MG tablet Take 1 tablet by mouth every 6 (six) hours as needed. (Patient not  taking: Reported on 08/15/2016) 12 tablet 0 Not Taking at Unknown time  . potassium chloride (K-DUR) 10 MEQ tablet Take 1 tablet (10 mEq total) by mouth daily. (Patient not taking: Reported on 08/15/2016) 90 tablet 0 Not Taking at Unknown time    Patient Stressors: Marital or family conflict Substance abuse  Patient Strengths: Ability for insight Curator fund of knowledge Motivation for treatment/growth  Treatment Modalities: Medication Management, Group therapy, Case management,  1 to 1 session with clinician, Psychoeducation, Recreational therapy.   Physician Treatment Plan for Primary Diagnosis: Severe recurrent major  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 ([place]) Long Term Goal(s): Improvement in symptoms so as ready for discharge Improvement in symptoms so as ready for discharge   Short Term Goals: Ability to identify changes in lifestyle to reduce recurrence of condition will improve Ability to verbalize feelings will improve Ability to disclose and discuss suicidal ideas Ability to demonstrate self-control will improve Ability to identify and develop effective coping behaviors will improve Compliance with prescribed medications will improve Ability to identify triggers associated with substance abuse/mental health issues will improve Ability to identify changes in lifestyle to reduce recurrence of condition will improve Ability to demonstrate self-control will improve Ability to identify triggers associated with substance abuse/mental health issues will improve  Medication Management: Evaluate patient's response, side effects, and tolerance of medication regimen.  Therapeutic Interventions: 1 to 1 sessions, Unit Group sessions and Medication administration.  Evaluation of Outcomes: Progressing  Physician Treatment Plan for Secondary Diagnosis: Principal Problem:   Severe recurrent major 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 ([place]) Active Problems:    Hypertension   Gout   Alcohol use disorder, severe, dependence ([place])   Substance induced mood disorder ([place])   Involuntary commitment   Suicide and self-inflicted injury by cutting and piercing instrument ([place])  Long Term Goal(s): Improvement in symptoms so as ready for discharge Improvement in symptoms so as ready for discharge   Short Term Goals: Ability to identify changes in lifestyle to reduce recurrence of condition will improve Ability to verbalize feelings will improve Ability to disclose and discuss suicidal ideas Ability to demonstrate self-control will improve Ability to identify and develop effective coping behaviors will improve Compliance with prescribed medications will improve Ability to identify triggers associated with substance abuse/mental health issues will improve Ability to identify changes in lifestyle to reduce recurrence of condition will improve Ability to demonstrate self-control will improve Ability to identify triggers associated with substance abuse/mental health issues will improve     Medication Management: Evaluate patient's response, side effects, and tolerance of medication regimen.  Therapeutic Interventions: 1 to 1 sessions, Unit Group sessions and Medication administration.  Evaluation of Outcomes: Progressing   RN Treatment Plan for Primary Diagnosis: Severe recurrent major 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 ([place]) Long Term Goal(s): Knowledge of disease and therapeutic regimen to maintain health will improve  Short Term Goals: Ability to demonstrate self-control, Ability to verbalize feelings will improve and Ability to disclose and discuss suicidal ideas  Medication Management: RN will administer medications as ordered by provider, will assess and evaluate patient's response and provide education to patient for prescribed medication. RN will report any adverse and/or side effects to prescribing provider.  Therapeutic Interventions: 1 on 1  counseling sessions, Psychoeducation, Medication administration, Evaluate responses to treatment, Monitor vital signs and CBGs as ordered, Perform/monitor CIWA, COWS, AIMS and Fall Risk screenings as ordered, Perform wound care treatments as ordered.  Evaluation of Outcomes: Progressing   LCSW Treatment Plan for Primary Diagnosis: Severe recurrent major 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 ([place]) Long Term Goal(s): Safe transition to appropriate next level of care at discharge, Engage patient in therapeutic group addressing interpersonal concerns.  Short Term Goals: Engage patient in aftercare planning with referrals and resources, Increase social support, Increase ability to appropriately verbalize feelings and Increase emotional regulation  Therapeutic Interventions: Assess for all discharge needs, 1 to 1 time with Social worker, Explore available resources and support systems, Assess for adequacy in community support network, Educate family and significant other(s) on suicide prevention, Complete Psychosocial Assessment, Interpersonal group therapy.  Evaluation of  Outcomes: Progressing   Progress in Treatment: Attending groups: Yes. Participating in groups: Yes. Taking medication as prescribed: Yes. Toleration medication: Yes. Family/Significant other contact made: No, will contact:  CSW assessing proper family contact. Patient understands diagnosis: Yes. Discussing patient identified problems/goals with staff: Yes. Medical problems stabilized or resolved: Yes. Denies suicidal/homicidal ideation: Yes. Issues/concerns per patient self-inventory: No.  New problem(s) identified: No, Describe:  None identified.  New Short Term/Long Term Goal(s): Patient's goal is to return home and take care of his wife.  Discharge Plan or Barriers: CSW assessing proper aftercare plans.  Reason for Continuation of Hospitalization: Depression Suicidal ideation Withdrawal symptoms  Estimated Length  of Stay: 3 days   Attendees: Patient: Miguel [name]  03/10/2017 12:07 PM  Physician: Dr. Orson Slick, MD  03/10/2017 12:07 PM  Nursing: Floyde Parkins, RN  03/10/2017 12:07 PM  RN Care Manager: 03/10/2017 12:07 PM  Social Worker: Glorious Peach, MSW, LCSW-A 03/10/2017 12:07 PM  Recreational Therapist:  03/10/2017 12:07 PM  Other:  03/10/2017 12:07 PM  Other:  03/10/2017 12:07 PM  Other: 03/10/2017 12:07 PM    Scribe for Treatment Team: Emilie Rutter, [place] 03/10/2017 12:07 PM

## 2017-03-10 NOTE — BHH Group Notes (Signed)
[facility] LCSW Group Therapy   03/10/2017  9:30 am   Type of Therapy: Group Therapy   Participation Level: Minimal  Participation Quality: Attentive, Sharing and Supportive   Affect: Depressed and Flat   Cognitive: Alert and Oriented   Insight: Developing/Improving and Engaged   Engagement in Therapy: Developing/Improving and Engaged   Modes of Intervention: Clarification, Confrontation, Discussion, Education, Exploration, Limit-setting, Orientation, Problem-solving, Rapport Building, Art therapist, Socialization and Support   Summary of Progress/Problems: The topic for group today was emotional regulation. This group focused on both positive and negative emotion identification and allowed  group members to process ways to identify feelings, regulate negative emotions, and find healthy ways to manage internal/external emotions. Group members were asked to reflect on a time when their reaction to an emotion led to a negative outcome and explored how alternative responses using emotion regulation would have benefited them. Group members were also asked to discuss a time when emotion regulation was utilized when a negative emotion was experienced.     Glorious Peach, MSW, LCSWA 03/10/2017, 11:53AM

## 2017-03-10 NOTE — Progress Notes (Signed)
Reports fair sleep last night with the help of sleep medication, good appetite, normal energy. Rates hopelessness 0/10, anxiety 10/10 (low 0-10 high). Denies SI/HI/AVH, pain. Focused on caring for wife at discharge, and arranging for her care before discharge. Goal today is "stop drinking/cutting" by "listen." Pt somewhat isolative to room most of the day. Attends group and leaves room for meals/meds. Denies withdrawal symptoms. Pt pleasant, cooperative, appears worried. Appropriately interacts with staff/peers. Support and encouragement provided. Medications administered as ordered with education. Safety maintained with every 15 minute checks. Will continue to monitor.

## 2017-03-10 NOTE — BHH Counselor (Signed)
Adult Comprehensive Assessment  Patient ID: WILLIM TURNAGE, male   DOB: 07-25-1961, 56 y.o.   MRN: [id number]  Information Source: Information source: Patient  Current Stressors:  Educational / Learning stressors: None identified  Employment / Job issues: None identified  Family Relationships: None identified  Museum/gallery curator / Lack of resources (include bankruptcy): None identified  Housing / Lack of housing: None identified  Physical health (include injuries & life threatening diseases): None identified  Social relationships: None identified  Substance abuse: Alcohol abuse  Bereavement / Loss: None identified   Living/Environment/Situation:  Living Arrangements: Spouse/significant other Living conditions (as described by patient or guardian): They are fine  How long has patient lived in current situation?: 7 years  What is atmosphere in current home: Loving, Comfortable  Family History:  Marital status: Married Number of Years Married: 55 What types of issues is patient dealing with in the relationship?: No issues reported  What is your sexual orientation?: Heterosexual  Does patient have children?: No  Childhood History:  By whom was/is the patient raised?: Both parents Description of patient's relationship with caregiver when they were a child: Had a great relationship with both parents  Patient's description of current relationship with people who raised him/her: Still has a good relationship with parents  How were you disciplined when you got in trouble as a child/adolescent?: Punishment  Does patient have siblings?: Yes Number of Siblings: 2 Description of patient's current relationship with siblings: Two brothers who are younger - has a good relationship with siblings  Did patient suffer any verbal/emotional/physical/sexual abuse as a child?: No Did patient suffer from severe childhood neglect?: No Has patient ever been sexually abused/assaulted/raped as an adolescent or  adult?: No Was the patient ever a victim of a crime or a disaster?: No Witnessed domestic violence?: No Has patient been effected by domestic violence as an adult?: No  Education:  Highest grade of school patient has completed: 11th grade  Learning disability?: No  Employment/Work Situation:   Employment situation: Unemployed Patient's job has been impacted by current illness: No What is the longest time patient has a held a job?: 20 years  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  Has patient ever been in the [employer]?: No Has patient ever served in combat?: No Did You Receive Any Psychiatric Treatment/Services While in Passenger transport manager?: No Are There Guns or Other Weapons in [place]?: No Are These Psychologist, educational?:  (N/A)  Financial Resources:   Financial resources: Income from spouse Does patient have a representative payee or guardian?: No  Alcohol/Substance Abuse:   What has been your use of drugs/alcohol within the last 12 months?: Alcohol use (pint or less) every few days  If attempted suicide, did drugs/alcohol play a role in this?: No Alcohol/Substance Abuse Treatment Hx: Denies past history Has alcohol/substance abuse ever caused legal problems?: No  Social Support System:   Pensions consultant Support System: Fair Astronomer System: Patient has an "okay" community support system  Type of faith/religion: Methodist  How does patient's faith help to cope with current illness?: None identified   Leisure/Recreation:   Leisure and Hobbies: Work with hands, play pool   Strengths/Needs:   What things does the patient do well?: Working with hands, fixing things  In what areas does patient struggle / problems for patient: Alcohol use   Discharge Plan:   Does patient have access to transportation?: Yes Will patient be returning to same living situation after discharge?:  Yes Currently receiving community mental health  services: No If no, would patient like referral for services when discharged?: Yes (What [place] ) Does patient have financial barriers related to discharge medications?: Yes Patient description of barriers related to discharge medications: Referred to medication management clinic   Summary/Recommendations:   Summary and Recommendations (to be completed by the evaluator): Patient presented to the hospital admitted for self-injurious behaviors and alcohol abuse. Pt is a 56 year old man living in [place], [place]. Pt's primary diagnosis is major depressive disorder. Pt reports primary triggers for admission was his wife being away in rehabilitation and alcohol abuse. Patient stated that he has been the care provider for his wife for years and is unable to do residential treatment at this time. Patient denies SI/HI at this time. He stated that he had no intentions on killing himself. Pt agreeable to outpatient treatment with [facility]. Patient will benefit from crisis stabilization, medication evaluation, group therapy, and psycho education in addition to case management for discharge planning. Patient and CSW reviewed pt's identified goals and treatment plan. Pt verbalized understanding and agreed to treatment plan. At discharge it is recommended that patient remain compliant with established plan and continue treatment.  Emilie Rutter, MSW, LCSW-A  03/10/2017

## 2017-03-10 NOTE — Progress Notes (Signed)
Recreation Therapy Notes  Date: 04.25.18 Time: 1:00 pm Location: Craft Room  Group Topic: Self-esteem  Goal Area(s) Addresses:  Patient will write at least one positive trait about self. Patient will verbalize benefit of having a healthy self-esteem.  Behavioral Response: Attentive, Interactive  Intervention: I Am  Activity: Patients were given a worksheet with the letter I on it and were instructed to write as many positive traits about themselves inside the letter.   Education: LRT educated patients on ways they can increase their self-esteem.  Education Outcome: Acknowledges education/In group clarification offered  Clinical Observations/Feedback: Patient wrote positive traits about self. Patient contributed to group discussion by stating why it is easier to think negatively.  Leonette Monarch, LRT/CTRS 03/10/2017 3:37 PM

## 2017-03-10 NOTE — Progress Notes (Signed)
Pt tearful stating that "I can't go to treatment after discharge because my wife needs to come home." Pt reports his wife was to be discharged from inpatient rehab yesterday, but due to his hospitalization, the discharge was postponed. Pt states he is the only person who has access to his wife's medication for multiple sclerosis which is at his home and that rehab facility is not able to provide that particular medication for her. Pt worried about discharging, needing to discharge soon due to his wife needing care. Voices he "can't" go to further treatment facility after discharge. SW informed, SW to see pt and speak with him this afternoon. Support and encouragement provided. Safety maintained with every 15 minute checks. Will continue to monitor.

## 2017-03-10 NOTE — [facility] Suicide Risk Assessment (Signed)
[facility] Admission Suicide Risk Assessment   Nursing information obtained from:  Patient Demographic factors:  Unemployed, Caucasian, Male Current Mental Status:  NA Loss Factors:  Loss of significant relationship Historical Factors:  NA Risk Reduction Factors:  Living with another person, especially a relative  Total Time spent with patient: 1 hour Principal Problem: Severe recurrent major 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 ([place]) Diagnosis:   Patient Active Problem List   Diagnosis Date Noted  . Suicide and self-inflicted injury by cutting and piercing instrument ([place]) [X78.9XXA] 03/10/2017  . Severe recurrent major 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 ([place]) [F33.2] 03/09/2017  . Substance induced mood disorder ([place]) [F19.94] 08/15/2016  . Involuntary commitment [Z04.6] 08/15/2016  . Alcohol use disorder, severe, dependence ([place]) [F10.20] 02/05/2016  . Hypertension [I10] 12/05/2015  . Tachycardia [R00.0] 12/05/2015  . Gout [M10.9] 11/13/2015  . Chronic back pain [M54.9, G89.29] 05/02/2015   Subjective Data: suicide attempt.  Continued Clinical Symptoms:  Alcohol Use Disorder Identification Test Final Score (AUDIT): 33 The "Alcohol Use Disorders Identification Test", Guidelines for Use in Primary Care, Second Edition.  World [employer] [facility]). Score between 0-7:  no or low risk or alcohol related problems. Score between 8-15:  moderate risk of alcohol related problems. Score between 16-19:  high risk of alcohol related problems. Score 20 or above:  warrants further diagnostic evaluation for alcohol dependence and treatment.   CLINICAL FACTORS:   Depression:   Comorbid alcohol abuse/dependence Hopelessness Impulsivity Insomnia Alcohol/Substance Abuse/Dependencies   Musculoskeletal: Strength & Muscle Tone: within normal limits Gait & Station: normal Patient leans: N/A  Psychiatric Specialty Exam: Physical Exam  Nursing note and vitals reviewed. Psychiatric:  His speech is normal. Thought content normal. His mood appears anxious. His affect is blunt. He is slowed and withdrawn. Cognition and memory are normal. He expresses impulsivity. He exhibits a depressed mood.    Review of Systems  Psychiatric/Behavioral: Positive for depression and substance abuse. The patient has insomnia.   All other systems reviewed and are negative.   Blood pressure (!) 159/93, pulse 81, temperature 98.4 F (36.9 C), temperature source Oral, resp. rate 18, height 6' (1.829 m), weight 84.4 kg (186 lb), SpO2 98 %.Body mass index is 25.23 kg/m.  General Appearance: Disheveled  Eye Contact:  Fair  Speech:  Clear and Coherent  Volume:  Decreased  Mood:  Depressed, Hopeless and Worthless  Affect:  Blunt and Tearful  Thought Process:  Goal Directed and Descriptions of Associations: Intact  Orientation:  Full (Time, Place, and Person)  Thought Content:  WDL  Suicidal Thoughts:  No  Homicidal Thoughts:  No  Memory:  Immediate;   Fair Recent;   Fair Remote;   Fair  Judgement:  Poor  Insight:  Lacking  Psychomotor Activity:  Decreased and Psychomotor Retardation  Concentration:  Concentration: Fair and Attention Span: Fair  Recall:  [employer] of Knowledge:  Fair  Language:  Fair  Akathisia:  No  Handed:  Right  AIMS (if indicated):     Assets:  Communication Skills Desire for Improvement Housing Intimacy Physical Health Resilience Social Support Transportation  ADL's:  Intact  Cognition:  WNL  Sleep:  Number of Hours: 4.15      COGNITIVE FEATURES THAT CONTRIBUTE TO RISK:  None    SUICIDE RISK:   Moderate:  Frequent suicidal ideation with limited intensity, and duration, some specificity in terms of plans, no associated intent, good self-control, limited dysphoria/symptomatology, some risk factors present, and identifiable protective factors, including available and accessible  social support.  PLAN OF CARE: Hospital admission, medication management,  substance abuse counseling, discharge planning.  Mr. Nilsson is a 56 year old male with history of alcoholism admitted after suicide attempt by cutting and being in the context of severe social stressors and drinking.  1. Suicidal ideation. The patient is able to contract for safety in the hospital.  2. Mood. He was started on Prozac for depression.  3. Alcohol detox. He is on Librium taper.  4. Substance abuse treatment. We recommend that the patient follows up in residential treatment for alcoholism.  5. Hypertension. We will start hydrochlorothiazide and lisinopril.  6. Insomnia. We'll start trazodone.  7. COPD. He is on inhaler.  8. Disposition. To be established. Residential substance abuse treatment would be preferable.  I certify that inpatient services furnished can reasonably be expected to improve the patient's condition.   Orson Slick, MD 03/10/2017, 10:05 AM

## 2017-03-10 NOTE — Progress Notes (Signed)
Patient admitted today for substance abuse and self inflicted cuts requiring sutures.  Patient reports his stressor is his wife's failing health.  Patient search performed.  No contraband found.

## 2017-03-10 NOTE — Progress Notes (Signed)
Recreation Therapy Notes  INPATIENT RECREATION THERAPY ASSESSMENT  Patient Details Name: Miguel [name] MRN: [id number] DOB: Nov 30, 1960 Today's Date: 03/10/2017  Patient Stressors: Relationship, Friends (Wife has MS and he has not seen her for 3 weeks because she has been in a hospital - he is the care taker; lack of supportive friends)  Coping Skills:   Substance Abuse, Self-Injury, Exercise, Art/Dance, Music, Sports, Other (Comment) (Watch TV, fixes things)  Personal Challenges: Communication, Decision-Making, Expressing Yourself, Stress Management, Substance Abuse  Leisure Interests (2+):  Individual - Other (Comment) (Working on his car, [employer])  Awareness of Community Resources:  Yes  Community Resources:  Library  Current Use: Yes  If no, Barriers?:    Patient Strengths:  Smart, helpful  Patient Identified Areas of Improvement:  Stop drinking  Current Recreation Participation:  Painting bathroom  Patient Goal for Hospitalization:  To discharge  [place] of Residence:  [place]   Current SI (including self-harm):  No  Current HI:  No  Consent to Intern Participation: N/A   Leonette Monarch, LRT/CTRS 03/10/2017, 3:54 PM

## 2017-03-10 NOTE — H&P (Signed)
Psychiatric Admission Assessment Adult  Patient Identification: Miguel [name] MRN:  [id number] Date of Evaluation:  03/10/2017 Chief Complaint:  Depression Principal Diagnosis: Severe recurrent major 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 [facility]) Diagnosis:   Patient Active Problem List   Diagnosis Date Noted  . Suicide and self-inflicted injury by cutting and piercing instrument ([place]) [X78.9XXA] 03/10/2017  . Severe recurrent major 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 ([place]) [F33.2] 03/09/2017  . Substance induced mood disorder ([place]) [F19.94] 08/15/2016  . Involuntary commitment [Z04.6] 08/15/2016  . Alcohol use disorder, severe, dependence ([place]) [F10.20] 02/05/2016  . Hypertension [I10] 12/05/2015  . Tachycardia [R00.0] 12/05/2015  . Gout [M10.9] 11/13/2015  . Chronic back pain [M54.9, G89.29] 05/02/2015   History of Present Illness:   Identifying data. Miguel [name] is a 56 year old male with history of alcoholism and depression.  Chief complaint. "I was getting ready to bring my wife home."  History of present illness. Information was obtained from the patient and the chart. The patient has a long history of alcohol abuse and mood instability. He has been under considerable stress lately as his wife who suffers MS developed new problems and has been in a rehabilitation facility for the past few weeks. He has been very attached to his wife, could not visit her frequently and due to financial problems and became increasingly depressed with poor sleep, decreased appetite and weight loss of 20 pounds, anhedonia, feeling of guilt and hopelessness worthlessness, poor energy and concentration, social isolation, crying spells. He started drinking heavily. He consumes a pine of vodka almost every day. In the past 3 weeks he has been to the emergency room almost 10 times for either drinking or drinking brief related injuries. On April 22, the day of admission to the emergency room, he was supposed to pick  up his wife from the rehabilitation center. He got drunk instead, started cutting himself with a knife and screwdriver and then called EMS. This was his second visit to the emergency room that day. EMS workers found his kitchen table and is seeing covered in blood with a screwdriver on the table. He requires stitches for some of his wounds. He was transferred to psychiatry for further treatment. The patient denies psychotic symptoms or symptoms of severe anxiety. He does report racing thoughts and difficulty sleeping. He denies symptoms suggestive of bipolar mania. He denies other than alcohol substance use. His blood alcohol level was 200 few days prior it was 360.  Past psychiatric history. The patient denies ever being hospitalized or treated for depression. He has never attempt substance abuse treatment. There were no suicide attempts. He has been drinking heavily for about 10 years. Recently he was able to stop for 4 months but relapsed beginning of the year.  Family psychiatric history. Father with alcoholism.  Social history. He has been married to his wife for 35 years. She is now disabled from [place] and her condition is worsening. Several years ago at the patient. 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 The support themselves from her disability income. Money is very tight.   Total Time spent with patient: 1 hour  Is the patient at risk to self? Yes.    Has the patient been a risk to self in the past 6 months? No.  Has the patient been a risk to self within the distant past? No.  Is the patient a risk to others? No.  Has the patient been a risk to others in the past 6 months?  No.  Has the patient been a risk to others within the distant past? No.   Prior Inpatient Therapy:   Prior Outpatient Therapy:    Alcohol Screening: 1. How often do you have a drink containing alcohol?: 4 or more times a week 2. How many drinks containing alcohol do you have on a typical day when you  are drinking?: 5 or 6 3. How often do you have six or more drinks on one occasion?: Daily or almost daily Preliminary Score: 6 4. How often during the last year have you found that you were not able to stop drinking once you had started?: Weekly 5. How often during the last year have you failed to do what was normally expected from you becasue of drinking?: Weekly 6. How often during the last year have you needed a first drink in the morning to get yourself going after a heavy drinking session?: Weekly 7. How often during the last year have you had a feeling of guilt of remorse after drinking?: Weekly 8. How often during the last year have you been unable to remember what happened the night before because you had been drinking?: Weekly 9. Have you or someone else been injured as a result of your drinking?: Yes, during the last year 10. Has a relative or friend or a doctor or another health worker been concerned about your drinking or suggested you cut down?: Yes, during the last year Alcohol Use Disorder Identification Test Final Score (AUDIT): 33 Brief Intervention: Patient declined brief intervention Substance Abuse History in the last 12 months:  Yes.   Consequences of Substance Abuse: Negative Previous Psychotropic Medications: No  Psychological Evaluations: No  Past Medical History:  Past Medical History:  Diagnosis Date  . Alcohol abuse   . Asthma   . GERD (gastroesophageal reflux disease)   . Gout   . Hypertension   . OCD (obsessive compulsive disorder)   . Renal colic     Past Surgical History:  Procedure Laterality Date  . CHOLECYSTECTOMY  2012   Family History: History reviewed. No pertinent family history.  Tobacco Screening: Have you used any form of tobacco in the last 30 days? (Cigarettes, Smokeless Tobacco, Cigars, and/or Pipes): No Social History:  History  Alcohol Use  . Yes    Comment: 1 pint of vodka a day     History  Drug Use No    Additional Social  History:                           Allergies:  No Known Allergies Lab Results:  Results for orders placed or performed during the hospital encounter of 03/09/17 (from the past 48 hour(s))  CBC with Differential     Status: Abnormal   Collection Time: 03/09/17  1:37 AM  Result Value Ref Range   WBC 6.0 3.8 - 10.6 K/uL   RBC 3.89 (L) 4.40 - 5.90 MIL/uL   Hemoglobin 12.1 (L) 13.0 - 18.0 g/dL   HCT 35.4 (L) 40.0 - 52.0 %   MCV 91.1 80.0 - 100.0 fL   MCH 31.1 26.0 - 34.0 pg   MCHC 34.2 32.0 - 36.0 g/dL   RDW 16.3 (H) 11.5 - 14.5 %   Platelets 179 150 - 440 K/uL   Neutrophils Relative % 62 %   Neutro Abs 3.7 1.4 - 6.5 K/uL   Lymphocytes Relative 27 %   Lymphs Abs 1.6 1.0 - 3.6 K/uL  Monocytes Relative 10 %   Monocytes Absolute 0.6 0.2 - 1.0 K/uL   Eosinophils Relative 1 %   Eosinophils Absolute 0.1 0 - 0.7 K/uL   Basophils Relative 0 %   Basophils Absolute 0.0 0 - 0.1 K/uL  Comprehensive metabolic panel     Status: Abnormal   Collection Time: 03/09/17  1:37 AM  Result Value Ref Range   Sodium 145 135 - 145 mmol/L   Potassium 3.2 (L) 3.5 - 5.1 mmol/L   Chloride 111 101 - 111 mmol/L   CO2 25 22 - 32 mmol/L   Glucose, Bld 111 (H) 65 - 99 mg/dL   BUN 10 6 - 20 mg/dL   Creatinine, Ser 0.77 0.61 - 1.24 mg/dL   Calcium 8.4 (L) 8.9 - 10.3 mg/dL   Total Protein 5.6 (L) 6.5 - 8.1 g/dL   Albumin 3.1 (L) 3.5 - 5.0 g/dL   AST 23 15 - 41 U/L   ALT 23 17 - 63 U/L   Alkaline Phosphatase 82 38 - 126 U/L   Total Bilirubin 0.8 0.3 - 1.2 mg/dL   GFR calc non Af Amer >60 >60 mL/min   GFR calc Af Amer >60 >60 mL/min    Comment: (NOTE) The eGFR has been calculated using the CKD EPI equation. This calculation has not been validated in all clinical situations. eGFR's persistently <60 mL/min signify possible Chronic Kidney Disease.    Anion gap 9 5 - 15  Ethanol     Status: Abnormal   Collection Time: 03/09/17  1:37 AM  Result Value Ref Range   Alcohol, Ethyl (B) 200 (H) <5 mg/dL     Comment:        LOWEST DETECTABLE LIMIT FOR SERUM ALCOHOL IS 5 mg/dL FOR MEDICAL PURPOSES ONLY   Acetaminophen level     Status: Abnormal   Collection Time: 03/09/17  1:37 AM  Result Value Ref Range   Acetaminophen (Tylenol), Serum <10 (L) 10 - 30 ug/mL    Comment:        THERAPEUTIC CONCENTRATIONS VARY SIGNIFICANTLY. A RANGE OF 10-30 ug/mL MAY BE AN EFFECTIVE CONCENTRATION FOR MANY PATIENTS. HOWEVER, SOME ARE BEST TREATED AT CONCENTRATIONS OUTSIDE THIS RANGE. ACETAMINOPHEN CONCENTRATIONS >150 ug/mL AT 4 HOURS AFTER INGESTION AND >50 ug/mL AT 12 HOURS AFTER INGESTION ARE OFTEN ASSOCIATED WITH TOXIC REACTIONS.   Salicylate level     Status: None   Collection Time: 03/09/17  1:37 AM  Result Value Ref Range   Salicylate Lvl <6.3 2.8 - 30.0 mg/dL    Blood Alcohol level:  Lab Results  Component Value Date   ETH 200 (H) 03/09/2017   ETH 356 (HH) 87/56/4332    Metabolic Disorder Labs:  Lab Results  Component Value Date   HGBA1C 5.8 06/08/2014   No results found for: PROLACTIN Lab Results  Component Value Date   CHOL 160 06/27/2014   TRIG 234 (H) 06/27/2014   HDL 43 06/27/2014   VLDL 47 (H) 06/27/2014   LDLCALC 70 06/27/2014    Current Medications: Current Facility-Administered Medications  Medication Dose Route Frequency Provider Last Rate Last Dose  . acetaminophen (TYLENOL) tablet 650 mg  650 mg Oral Q6H PRN Gonzella Lex, MD      . albuterol (PROVENTIL HFA;VENTOLIN HFA) 108 (90 Base) MCG/ACT inhaler 2 puff  2 puff Inhalation Q4H PRN Gonzella Lex, MD      . alum & mag hydroxide-simeth (MAALOX/MYLANTA) 200-200-20 MG/5ML suspension 30 mL  30 mL Oral Q4H PRN Jenny Reichmann  T Clapacs, MD      . chlordiazePOXIDE (LIBRIUM) capsule 25 mg  25 mg Oral QID Clovis Fredrickson, MD   25 mg at 03/10/17 0831  . FLUoxetine (PROZAC) capsule 20 mg  20 mg Oral Daily Gonzella Lex, MD   20 mg at 03/10/17 0831  . folic acid (FOLVITE) tablet 1 mg  1 mg Oral Daily Gonzella Lex, MD    1 mg at 03/10/17 0831  . hydrochlorothiazide (HYDRODIURIL) tablet 25 mg  25 mg Oral Daily  B , MD      . lisinopril (PRINIVIL,ZESTRIL) tablet 20 mg  20 mg Oral Daily Gonzella Lex, MD   20 mg at 03/10/17 0831  . magnesium hydroxide (MILK OF MAGNESIA) suspension 30 mL  30 mL Oral Daily PRN Gonzella Lex, MD      . multivitamin with minerals tablet 1 tablet  1 tablet Oral Daily Gonzella Lex, MD   1 tablet at 03/10/17 0831  . thiamine (VITAMIN B-1) tablet 100 mg  100 mg Oral Daily Gonzella Lex, MD   100 mg at 03/10/17 0831   Or  . thiamine (B-1) injection 100 mg  100 mg Intravenous Daily Gonzella Lex, MD      . traZODone (DESYREL) tablet 100 mg  100 mg Oral QHS  B , MD       PTA Medications: Prescriptions Prior to Admission  Medication Sig Dispense Refill Last Dose  . albuterol (VENTOLIN HFA) 108 (90 Base) MCG/ACT inhaler Inhale 2 puffs into the lungs every 4 (four) hours as needed for wheezing or shortness of breath.   Not Taking at Unknown time  . allopurinol (ZYLOPRIM) 300 MG tablet Take 300 mg by mouth every morning.   Not Taking at Unknown time  . colchicine 0.6 MG tablet 1 tablet once or twice a day with food (Patient not taking: Reported on 02/19/2017) 40 tablet 2 Not Taking at Unknown time  . dexlansoprazole (DEXILANT) 60 MG capsule Take 60 mg by mouth every morning.   Not Taking at Unknown time  . etodolac (LODINE) 200 MG capsule Take 1 capsule (200 mg total) by mouth every 8 (eight) hours. (Patient not taking: Reported on 08/15/2016) 12 capsule 0 Not Taking at Unknown time  . etodolac (LODINE) 200 MG capsule Take 1 capsule (200 mg total) by mouth every 8 (eight) hours. (Patient not taking: Reported on 02/19/2017) 12 capsule 0 Not Taking at Unknown time  . FLUoxetine (PROZAC) 20 MG capsule Take 1 capsule (20 mg total) by mouth daily. (Patient not taking: Reported on 02/19/2017) 30 capsule 1 Not Taking at Unknown time  . Fluticasone-Salmeterol (ADVAIR)  100-50 MCG/DOSE AEPB Inhale 1 puff into the lungs 2 (two) times daily.   Not Taking at Unknown time  . HYDROcodone-acetaminophen (NORCO/VICODIN) 5-325 MG tablet Take 1-2 tablets by mouth every 4 (four) hours as needed for moderate pain. (Patient not taking: Reported on 08/15/2016) 15 tablet 0 Not Taking at Unknown time  . ibuprofen (ADVIL,MOTRIN) 600 MG tablet Take 1 tablet (600 mg total) by mouth every 6 (six) hours as needed. (Patient not taking: Reported on 08/15/2016) 30 tablet 0 Not Taking at Unknown time  . ketorolac (TORADOL) 10 MG tablet Take 1 tablet (10 mg total) by mouth every 8 (eight) hours. (Patient not taking: Reported on 08/15/2016) 15 tablet 0 Not Taking at Unknown time  . lidocaine (LIDODERM) 5 % Place 1 patch onto the skin every 12 (twelve) hours. Remove & Discard  patch within 12 hours or as directed by MD (Patient not taking: Reported on 02/19/2017) 10 patch 0 Not Taking at Unknown time  . lidocaine (LIDODERM) 5 % Place 1 patch onto the skin every 12 (twelve) hours. Remove & Discard patch within 12 hours or as directed by MD (Patient not taking: Reported on 03/02/2017) 10 patch 0 Not Taking  . lisinopril (PRINIVIL,ZESTRIL) 20 MG tablet Take 20 mg by mouth every morning.    Not Taking at Unknown time  . LORazepam (ATIVAN) 1 MG tablet Take 1 tablet (1 mg total) by mouth 2 (two) times daily as needed for anxiety. (Patient not taking: Reported on 04/30/2016) 10 tablet 0 Not Taking at Unknown time  . naproxen (EC NAPROSYN) 500 MG EC tablet Take 1 tablet (500 mg total) by mouth 2 (two) times daily with a meal. (Patient not taking: Reported on 08/15/2016) 30 tablet 0 Not Taking at Unknown time  . omeprazole (PRILOSEC) 20 MG capsule Take 20 mg by mouth daily.   Not Taking at Unknown time  . ondansetron (ZOFRAN ODT) 4 MG disintegrating tablet Take 1 tablet (4 mg total) by mouth every 8 (eight) hours as needed for nausea or vomiting. (Patient not taking: Reported on 02/19/2017) 20 tablet 0 Not Taking at  Unknown time  . ondansetron (ZOFRAN) 4 MG tablet Take 1 tablet (4 mg total) by mouth every 8 (eight) hours as needed for nausea or vomiting. (Patient not taking: Reported on 02/19/2017) 20 tablet 0 Not Taking at Unknown time  . oxyCODONE-acetaminophen (PERCOCET) 5-325 MG tablet Take 2 tablets by mouth every 6 (six) hours as needed for moderate pain or severe pain. 20 tablet 0 unknown at unknown  . oxyCODONE-acetaminophen (ROXICET) 5-325 MG tablet Take 1 tablet by mouth every 6 (six) hours as needed. (Patient not taking: Reported on 08/15/2016) 12 tablet 0 Not Taking at Unknown time  . potassium chloride (K-DUR) 10 MEQ tablet Take 1 tablet (10 mEq total) by mouth daily. (Patient not taking: Reported on 08/15/2016) 90 tablet 0 Not Taking at Unknown time    Musculoskeletal: Strength & Muscle Tone: within normal limits Gait & Station: normal Patient leans: N/A  Psychiatric Specialty Exam:  Physical Exam  Nursing note and vitals reviewed. Constitutional: He is oriented to person, place, and time. He appears well-developed and well-nourished.  HENT:  Head: Normocephalic and atraumatic.  Eyes: Conjunctivae and EOM are normal. Pupils are equal, round, and reactive to light.  Neck: Normal range of motion. Neck supple.  Cardiovascular: Normal rate and regular rhythm.   Respiratory: Effort normal and breath sounds normal.  GI: Soft. Bowel sounds are normal.  Musculoskeletal: Normal range of motion.  Neurological: He is alert and oriented to person, place, and time.  Skin: Skin is warm and dry. Laceration noted.     Multiple cuts on forarms, hand and legs some required stitching.  Psychiatric: His speech is normal. His affect is blunt. He is slowed and withdrawn. Cognition and memory are impaired. He expresses impulsivity. He exhibits a depressed mood. He expresses suicidal ideation.    Review of Systems  Psychiatric/Behavioral: Positive for substance abuse.  All other systems reviewed and are  negative.   Blood pressure (!) 159/93, pulse 81, temperature 98.4 F (36.9 C), temperature source Oral, resp. rate 18, height 6' (1.829 m), weight 84.4 kg (186 lb), SpO2 98 %.Body mass index is 25.23 kg/m.  See SRA.  Sleep:  Number of Hours: 4.15    Treatment Plan Summary: Daily contact with patient to assess and evaluate symptoms and progress in treatment and Medication management   Mr. Colaizzi is a 56 year old male with history of alcoholism admitted after suicide attempt by cutting and being in the context of severe social stressors and drinking.  1. Suicidal ideation. The patient is able to contract for safety in the hospital.  2. Mood. He was started on Prozac for depression.  3. Alcohol detox. He is on Librium taper.  4. Substance abuse treatment. We recommend that the patient follows up in residential treatment for alcoholism.  5. Hypertension. We will start hydrochlorothiazide and lisinopril.  6. Insomnia. We'll start trazodone.  7. COPD. He is on inhaler.  8. Disposition. To be established. Residential substance abuse treatment would be preferable.   Observation Level/Precautions:  15 minute checks  Laboratory:  CBC Chemistry Profile UDS UA  Psychotherapy:    Medications:    Consultations:    Discharge Concerns:    Estimated LOS:  Other:     Physician Treatment Plan for Primary Diagnosis: Severe recurrent major 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 ([place]) Long Term Goal(s): Improvement in symptoms so as ready for discharge  Short Term Goals: Ability to identify changes in lifestyle to reduce recurrence of condition will improve, Ability to verbalize feelings will improve, Ability to disclose and discuss suicidal ideas, Ability to demonstrate self-control will improve, Ability to identify and develop effective coping behaviors will improve, Compliance with prescribed medications will  improve and Ability to identify triggers associated with substance abuse/mental health issues will improve  Physician Treatment Plan for Secondary Diagnosis: Principal Problem:   Severe recurrent major 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 ([place]) Active Problems:   Hypertension   Gout   Alcohol use disorder, severe, dependence ([place])   Substance induced mood disorder ([place])   Involuntary commitment   Suicide and self-inflicted injury by cutting and piercing instrument ([place])  Long Term Goal(s): Improvement in symptoms so as ready for discharge  Short Term Goals: Ability to identify changes in lifestyle to reduce recurrence of condition will improve, Ability to demonstrate self-control will improve and Ability to identify triggers associated with substance abuse/mental health issues will improve  I certify that inpatient services furnished can reasonably be expected to improve the patient's condition.    Orson Slick, MD 4/25/201811:23 AM

## 2017-03-11 MED ORDER — TRAZODONE HCL 50 MG PO TABS: 50.0000 mg | ORAL_TABLET | Freq: Every evening | ORAL | Status: DC | PRN

## 2017-03-11 NOTE — Progress Notes (Addendum)
[facility] MD Progress Note  03/11/2017 1:48 PM CAIO DEVERA  MRN:  [id number]  Subjective:   03/11/2017. Mr. Ingham reports feeling better today. There are no symptoms of alcohol withdrawal. He tolerates medications well. He feels more hopeful now as his wife was transferred to a nursing facility in [place] for the next two weeks. He is still unkept but able to have a sensible discussion about his situation and discharge planning. He declines residential substance abuse treatment program participation.  Per nursing: D: Pt denies SI/HI/AVH. Pt is pleasant and cooperative, affect is flat and sad. Pt appears less anxious and he is interacting with peers and staff appropriately.  A: Pt was offered support and encouragement. Pt was given scheduled medications. Pt was encouraged to attend groups. Q 15 minute checks were done for safety.  R:Pt attends groups and interacts well with peers and staff. Pt is taking medication. Pt has no complaints.Pt receptive to treatment and safety maintained on unit.   Principal Problem: Severe recurrent major 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 ([place]) Diagnosis:   Patient Active Problem List   Diagnosis Date Noted  . Suicide and self-inflicted injury by cutting and piercing instrument ([place]) [X78.9XXA] 03/10/2017  . Severe recurrent major 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 ([place]) [F33.2] 03/09/2017  . Substance induced mood disorder ([place]) [F19.94] 08/15/2016  . Involuntary commitment [Z04.6] 08/15/2016  . Alcohol use disorder, severe, dependence ([place]) [F10.20] 02/05/2016  . Hypertension [I10] 12/05/2015  . Tachycardia [R00.0] 12/05/2015  . Gout [M10.9] 11/13/2015  . Chronic back pain [M54.9, G89.29] 05/02/2015   Total Time spent with patient: 20 minutes  Past Psychiatric History: alcoholism.  Past Medical History:  Past Medical History:  Diagnosis Date  . Alcohol abuse   . Asthma   . GERD (gastroesophageal reflux disease)   . Gout   . Hypertension   .  OCD (obsessive compulsive disorder)   . Renal colic     Past Surgical History:  Procedure Laterality Date  . CHOLECYSTECTOMY  2012   Family History: History reviewed. No pertinent family history. Family Psychiatric  History: alcoholism. Social History:  History  Alcohol Use  . Yes    Comment: 1 pint of vodka a day     History  Drug Use No    Social History   Social History  . Marital status: Married    Spouse name: N/A  . Number of children: N/A  . Years of education: N/A   Social History Main Topics  . Smoking status: Former Research scientist (life sciences)  . Smokeless tobacco: Never Used  . Alcohol use Yes     Comment: 1 pint of vodka a day  . Drug use: No  . Sexual activity: Not Asked   Other Topics Concern  . None   Social History Narrative  . None   Additional Social History:                         Sleep: Poor  Appetite:  Fair  Current Medications: Current Facility-Administered Medications  Medication Dose Route Frequency Provider Last Rate Last Dose  . acetaminophen (TYLENOL) tablet 650 mg  650 mg Oral Q6H PRN Gonzella Lex, MD      . albuterol (PROVENTIL HFA;VENTOLIN HFA) 108 (90 Base) MCG/ACT inhaler 2 puff  2 puff Inhalation Q4H PRN Gonzella Lex, MD      . alum & mag hydroxide-simeth (MAALOX/MYLANTA) 200-200-20 MG/5ML suspension 30 mL  30 mL Oral Q4H PRN Gonzella Lex, MD      .  chlordiazePOXIDE (LIBRIUM) capsule 25 mg  25 mg Oral QID Clovis Fredrickson, MD   25 mg at 03/11/17 1303  . FLUoxetine (PROZAC) capsule 20 mg  20 mg Oral Daily Gonzella Lex, MD   20 mg at 03/11/17 0850  . folic acid (FOLVITE) tablet 1 mg  1 mg Oral Daily Gonzella Lex, MD   1 mg at 03/11/17 0850  . lisinopril (PRINIVIL,ZESTRIL) tablet 20 mg  20 mg Oral Daily Gonzella Lex, MD   20 mg at 03/10/17 0831  . magnesium hydroxide (MILK OF MAGNESIA) suspension 30 mL  30 mL Oral Daily PRN Gonzella Lex, MD      . multivitamin with minerals tablet 1 tablet  1 tablet Oral Daily Gonzella Lex, MD   1 tablet at 03/11/17 0850  . risperiDONE (RISPERDAL) tablet 2 mg  2 mg Oral BID Clovis Fredrickson, MD   2 mg at 03/11/17 0850  . thiamine (VITAMIN B-1) tablet 100 mg  100 mg Oral Daily Gonzella Lex, MD   100 mg at 03/11/17 0850   Or  . thiamine (B-1) injection 100 mg  100 mg Intravenous Daily Gonzella Lex, MD      . traZODone (DESYREL) tablet 100 mg  100 mg Oral QHS Clovis Fredrickson, MD   100 mg at 03/10/17 2202    Lab Results:  No results found for this or any previous visit (from the past 52 hour(s)).  Blood Alcohol level:  Lab Results  Component Value Date   ETH 200 (H) 03/09/2017   ETH 356 (HH) 23/55/7322    Metabolic Disorder Labs: Lab Results  Component Value Date   HGBA1C 5.8 06/08/2014   No results found for: PROLACTIN Lab Results  Component Value Date   CHOL 160 06/27/2014   TRIG 234 (H) 06/27/2014   HDL 43 06/27/2014   VLDL 47 (H) 06/27/2014   LDLCALC 70 06/27/2014    Physical Findings: AIMS: Facial and Oral Movements Muscles of Facial Expression: None, normal Lips and Perioral Area: None, normal Jaw: None, normal Tongue: None, normal,Extremity Movements Upper (arms, wrists, hands, fingers): None, normal Lower (legs, knees, ankles, toes): None, normal, Trunk Movements Neck, shoulders, hips: None, normal, Overall Severity Severity of abnormal movements (highest score from questions above): None, normal Incapacitation due to abnormal movements: None, normal Patient's awareness of abnormal movements (rate only patient's report): No Awareness, Dental Status Current problems with teeth and/or dentures?: No Does patient usually wear dentures?: No  CIWA:  CIWA-Ar Total: 0 COWS:     Musculoskeletal: Strength & Muscle Tone: within normal limits Gait & Station: normal Patient leans: N/A  Psychiatric Specialty Exam: Physical Exam  Nursing note and vitals reviewed. Psychiatric: His speech is normal and behavior is normal. Thought  content normal. His affect is labile. Cognition and memory are impaired. He expresses impulsivity. He exhibits a depressed mood.    Review of Systems  Psychiatric/Behavioral: Positive for substance abuse.  All other systems reviewed and are negative.   Blood pressure 96/62, pulse 95, temperature 98.3 F (36.8 C), temperature source Oral, resp. rate 20, height 6' (1.829 m), weight 84.4 kg (186 lb), SpO2 99 %.Body mass index is 25.23 kg/m.  General Appearance: Disheveled  Eye Contact:  Fair  Speech:  Clear and Coherent  Volume:  Decreased  Mood:  Depressed and Worthless  Affect:  Depressed and Tearful  Thought Process:  Goal Directed and Descriptions of Associations: Intact  Orientation:  Full (Time, Place,  and Person)  Thought Content:  WDL  Suicidal Thoughts:  No  Homicidal Thoughts:  No  Memory:  Immediate;   Fair Recent;   Fair Remote;   Fair  Judgement:  Poor  Insight:  Lacking  Psychomotor Activity:  Psychomotor Retardation  Concentration:  Concentration: Fair and Attention Span: Fair  Recall:  [employer] of Knowledge:  Fair  Language:  Fair  Akathisia:  No  Handed:  Right  AIMS (if indicated):     Assets:  Communication Skills Desire for Improvement Housing Physical Health Resilience Social Support Transportation  ADL's:  Intact  Cognition:  WNL  Sleep:  Number of Hours: 8.5     Treatment Plan Summary: Daily contact with patient to assess and evaluate symptoms and progress in treatment and Medication management   Mr. Ravi is a 56 year old male with history of alcoholism admitted after suicide attempt by cutting and being in the context of severe social stressors and drinking.  1. Suicidal ideation. The patient is able to contract for safety in the hospital.  2. Mood. He was started on Prozac for depression and Risperdal for mood stabilization.  3. Alcohol detox. He is on Librium taper. His BP was low today.  4. Substance abuse treatment. We recommend  that the patient follows up in residential treatment for alcoholism but he refuses. He is open to [facility]. He refuses pharmacotherapy for alcoholism.  5. Hypertension. We discontinued hydrochlorothiazide and keep isinopril.  6. Insomnia. We'll lower Trazodone to 50 mg prn.  7. COPD. He is on inhaler.  8. Metabolic syndrome monitoring. Lipid panel, TSH and HgbA1C pending.  9. EKG. Pending.  10. Disposition. He will be discharged to home. He will follow up with [facility].   Orson Slick, MD 03/11/2017, 1:48 PM

## 2017-03-11 NOTE — Plan of Care (Signed)
Problem: [facility] Participation in Recreation Therapeutic Interventions Goal: STG-Patient will identify at least five coping skills for ** STG: Coping Skills - Within 4 treatment sessions, patient will verbalize at least 5 coping skills for substance abuse in each of 2 treatment sessions to decrease substance abuse.  Outcome: Progressing Treatment Session 1; Completed 1 out of 2: At approximately 11:10 am, LRT met with patient in consultation room. Patient verbalized 5 coping skills for substance abuse. LRT educated patient on leisure and why it is important to implement it into his schedule. LRT educated and provided patient with blank schedules to help him plan his day and try to avoid using substances. LRT educated patient on healthy leisure activities.  Leonette Monarch, LRT/CTRS 04.26.18 3:23 pm Goal: STG-Other Recreation Therapy Goal (Specify) STG: Stress Management - Within 4 treatment sessions, patient will verbalize understanding of the stress management techniques in each of 2 treatment sessions to increase stress management skills.  Outcome: Progressing Treatment Session 1; Completed 1 out of 2: At approximately 11:10 am, LRT met with patient in consultation room. LRT educated and provided patient with handouts on stress management techniques. Patient verbalized understanding. LRT encouraged patient to read over and practice the stress management techniques.  Leonette Monarch, LRT/CTRS 04.26.18 3:26 pm

## 2017-03-11 NOTE — Progress Notes (Signed)
Affect sad.  Verbalizes that he is worried about his wife and that he cut himself because he was home alone.  Denies any thoughts of self harm this shift.  Pleasant and cooperative.  Assessed per CIWA protocol, detoxing well.  Support offered.  Scheduled medications given.  Safety checks maintained.

## 2017-03-11 NOTE — Progress Notes (Signed)
Recreation Therapy Notes  Date: 04.26.18 Time: 1:00 pm Location: Craft Room  Group Topic: Leisure Education  Goal Area(s) Addresses:  Patient will identify activities for each letter of the alphabet. Patient will verbalize ability to integrate positive leisure into life post d/c. Patient will verbalize ability to use leisure as a Technical sales engineer.  Behavioral Response: Attentive, Interactive  Intervention: Leisure Alphabet  Activity: Patients were given a Leisure Air traffic controller and were instructed to write healthy leisure activities for each letter of the alphabet.   Education: LRT educated patients on what they need to participate in leisure.  Education Outcome: Acknowledges education/In group clarification offered  Clinical Observations/Feedback: Patient wrote healthy leisure activities. Patient contributed to group discussion by stating some of his healthy leisure activities.  Leonette Monarch, LRT/CTRS 03/11/2017 1:56 PM

## 2017-03-11 NOTE — BHH Suicide Risk Assessment (Signed)
[place] INPATIENT:  Family/Significant Other Suicide Prevention Education  Suicide Prevention Education:  Education Completed;Pamela Rengel(wife [phone]), has been identified by the patient as the family member/significant other with whom the patient will be residing, and identified as the person(s) who will aid the patient in the event of a mental health crisis (suicidal ideations/suicide attempt).  With written consent from the patient, the family member/significant other has been provided the following suicide prevention education, prior to the and/or following the discharge of the patient.  The suicide prevention education provided includes the following:  Suicide risk factors  Suicide prevention and interventions  National Suicide Hotline telephone number  [facility] assessment telephone number  [facility] Emergency Assistance [place] and/or Residential Mobile Crisis Unit telephone number  Request made of family/significant other to:  Remove weapons (e.g., guns, rifles, knives), all items previously/currently identified as safety concern.    Remove drugs/medications (over-the-counter, prescriptions, illicit drugs), all items previously/currently identified as a safety concern.  The family member/significant other verbalizes understanding of the suicide prevention education information provided.  The family member/significant other agrees to remove the items of safety concern listed above.  Stran Raper G. [place], [place] 03/11/2017, 2:58 PM

## 2017-03-11 NOTE — BHH Group Notes (Signed)
[place] LCSW Group Therapy Note  Date/Time: 03/11/17, 0930  Type of Therapy/Topic:  Group Therapy:  Balance in Life  Participation Level:  active  Description of Group:    This group will address the concept of balance and how it feels and looks when one is unbalanced. Patients will be encouraged to process areas in their lives that are out of balance, and identify reasons for remaining unbalanced. Facilitators will guide patients utilizing problem- solving interventions to address and correct the stressor making their life unbalanced. Understanding and applying boundaries will be explored and addressed for obtaining  and maintaining a balanced life. Patients will be encouraged to explore ways to assertively make their unbalanced needs known to significant others in their lives, using other group members and facilitator for support and feedback.  Therapeutic Goals: 1. Patient will identify two or more emotions or situations they have that consume much of in their lives. 2. Patient will identify signs/triggers that life has become out of balance:  3. Patient will identify two ways to set boundaries in order to achieve balance in their lives:  4. Patient will demonstrate ability to communicate their needs through discussion and/or role plays  Summary of Patient Progress: Pt shared about work, family, and mental/emotional (particularly alcohol) as areas that are out of balance in his life.  Pt shared that he has been out of work due to caring for his wife and that he has allowed alcohol to take over as he has had more time at home.  Pt was made several contributions to the group discussion.          Therapeutic Modalities:   Cognitive Behavioral Therapy Solution-Focused Therapy Assertiveness Training  Lurline Idol, [place]

## 2017-03-11 NOTE — Progress Notes (Signed)
D: Pt denies SI/HI/AVH. Pt is pleasant and cooperative,  affect is flat and sad. Pt appears less anxious and he is interacting with peers and staff appropriately.  A: Pt was offered support and encouragement. Pt was given scheduled medications. Pt was encouraged to attend groups. Q 15 minute checks were done for safety.  R:Pt attends groups and interacts well with peers and staff. Pt is taking medication. Pt has no complaints.Pt receptive to treatment and safety maintained on unit.   

## 2017-03-11 NOTE — BHH Suicide Risk Assessment (Signed)
[place] INPATIENT:  Family/Significant Other Suicide Prevention Education  Suicide Prevention Education:  Contact Attempts:Pamela Lute(wife [phone]), has been identified by the patient as the family member/significant other with whom the patient will be residing, and identified as the person(s) who will aid the patient in the event of a mental health crisis.  With written consent from the patient, two attempts were made to provide suicide prevention education, prior to and/or following the patient's discharge.  We were unsuccessful in providing suicide prevention education.  A suicide education pamphlet was given to the patient to share with family/significant other.  Date and time of first attempt: 03/11/2017 / 2:11pm; no answer; CSW left HIPAA compliant message asking for returned call.   Phillippa Straub G. [place], [place] 03/11/2017, 2:15 PM

## 2017-03-11 NOTE — BHH Group Notes (Signed)
Goals Group Date/Time: 03/11/2017 9:00 AM Type of Therapy and Topic: Group Therapy: Goals Group: SMART Goals   Participation Level: Moderate  Description of Group:    The purpose of a daily goals group is to assist and guide patients in setting recovery/wellness-related goals. The objective is to set goals as they relate to the crisis in which they were admitted. Patients will be using SMART goal modalities to set measurable goals. Characteristics of realistic goals will be discussed and patients will be assisted in setting and processing how one will reach their goal. Facilitator will also assist patients in applying interventions and coping skills learned in psycho-education groups to the SMART goal and process how one will achieve defined goal.   Therapeutic Goals:   -Patients will develop and document one goal related to or their crisis in which brought them into treatment.  -Patients will be guided by LCSW using SMART goal setting modality in how to set a measurable, attainable, realistic and time sensitive goal.  -Patients will process barriers in reaching goal.  -Patients will process interventions in how to overcome and successful in reaching goal.   Patient's Goal: Pt reported his goal was to "be good to everybody"  CSW attempted to get him to apply SMART and he agreed that he will try to help 2 people today.   Therapeutic Modalities:  Motivational Interviewing  Art gallery manager  SMART goals setting   Lurline Idol, [place]

## 2017-03-12 LAB — LIPID PANEL
Cholesterol: 144 mg/dL (ref 0–200)
HDL: 52 mg/dL (ref >40)
LDL Cholesterol: 71 mg/dL (ref 0–99)
Total CHOL/HDL Ratio: 2.8 RATIO
Triglycerides: 107 mg/dL (ref <150)
VLDL: 21 mg/dL (ref 0–40)

## 2017-03-12 LAB — TSH: TSH: 4.525 u[IU]/mL — ABNORMAL HIGH (ref 0.350–4.500)

## 2017-03-12 MED ORDER — TRAZODONE HCL 50 MG PO TABS: 50.0000 mg | ORAL_TABLET | Freq: Every evening | ORAL | 1 refills | Status: DC | PRN

## 2017-03-12 MED ORDER — ALBUTEROL SULFATE HFA 108 (90 BASE) MCG/ACT IN AERS: 2.0000 | INHALATION_SPRAY | RESPIRATORY_TRACT | 1 refills | Status: DC | PRN

## 2017-03-12 MED ORDER — RISPERIDONE 2 MG PO TABS
2.0000 mg | ORAL_TABLET | Freq: Two times a day (BID) | ORAL | 1 refills | Status: DC
Start: 2017-03-12 — End: 2017-03-12

## 2017-03-12 MED ORDER — FLUOXETINE HCL 20 MG PO CAPS: 20.0000 mg | ORAL_CAPSULE | Freq: Every day | ORAL | 1 refills | Status: DC

## 2017-03-12 MED ORDER — LISINOPRIL 20 MG PO TABS: 20.0000 mg | ORAL_TABLET | ORAL | 1 refills | Status: DC

## 2017-03-12 MED ORDER — RISPERIDONE 2 MG PO TABS: 2.0000 mg | ORAL_TABLET | Freq: Two times a day (BID) | ORAL | 1 refills | Status: DC

## 2017-03-12 NOTE — Progress Notes (Signed)
D: Pt denies SI/HI/AVH, affect is flat but brightens upon approach. Patient's thoughts are organized, no bizarre behavior noted. Patient is   pleasant and cooperative. Pt stated he feels sad about the self inflicted wounds,and he would never do it again. Patient appears less anxious, still isolates to self but minimal interaction with peers and staff.  A: Pt was offered support and encouragement. Pt was given scheduled medications. Pt was encouraged to attend groups. Q 15 minute checks were done for safety.  R:Pt did not attends group. Pt is taking medication. Pt has no complaints.Pt receptive to treatment and safety maintained on unit.

## 2017-03-12 NOTE — BHH Suicide Risk Assessment (Signed)
[facility] Discharge Suicide Risk Assessment   Principal Problem: Severe recurrent major 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 [facility]) Discharge Diagnoses:  Patient Active Problem List   Diagnosis Date Noted  . Suicide and self-inflicted injury by cutting and piercing instrument ([place]) [X78.9XXA] 03/10/2017  . Severe recurrent major 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 ([place]) [F33.2] 03/09/2017  . Substance induced mood disorder ([place]) [F19.94] 08/15/2016  . Involuntary commitment [Z04.6] 08/15/2016  . Alcohol use disorder, severe, dependence ([place]) [F10.20] 02/05/2016  . Hypertension [I10] 12/05/2015  . Tachycardia [R00.0] 12/05/2015  . Gout [M10.9] 11/13/2015  . Chronic back pain [M54.9, G89.29] 05/02/2015    Total Time spent with patient: 30 minutes  Musculoskeletal: Strength & Muscle Tone: within normal limits Gait & Station: normal Patient leans: N/A  Psychiatric Specialty Exam: Review of Systems  Psychiatric/Behavioral: Positive for substance abuse.  All other systems reviewed and are negative.   Blood pressure 99/74, pulse 96, temperature 97.7 F (36.5 C), temperature source Oral, resp. rate 18, height 6' (1.829 m), weight 84.4 kg (186 lb), SpO2 99 %.Body mass index is 25.23 kg/m.  General Appearance: Fairly Groomed  Engineer, water::  Good  Speech:  Clear and Coherent409  Volume:  Normal  Mood:  Anxious  Affect:  Appropriate  Thought Process:  Goal Directed and Descriptions of Associations: Intact  Orientation:  Full (Time, Place, and Person)  Thought Content:  WDL  Suicidal Thoughts:  No  Homicidal Thoughts:  No  Memory:  Immediate;   Fair Recent;   Fair Remote;   Fair  Judgement:  Poor  Insight:  Lacking  Psychomotor Activity:  Normal  Concentration:  Fair  Recall:  [employer] of [place]  Language: Fair  Akathisia:  No  Handed:  Right  AIMS (if indicated):     Assets:  Communication Skills Desire for Improvement Housing Intimacy Physical  Health Resilience Social Support Transportation  Sleep:  Number of Hours: 6.5  Cognition: WNL  ADL's:  Intact   Mental Status Per Nursing Assessment::   On Admission:  NA  Demographic Factors:  Male, Caucasian and Unemployed  Loss Factors: Decrease in vocational status and Financial problems/change in socioeconomic status  Historical Factors: Prior suicide attempts, Family history of mental illness or substance abuse and Impulsivity  Risk Reduction Factors:   Sense of responsibility to family, Living with another person, especially a relative, Positive social support and Positive therapeutic relationship  Continued Clinical Symptoms:  Depression:   Comorbid alcohol abuse/dependence Impulsivity Alcohol/Substance Abuse/Dependencies  Cognitive Features That Contribute To Risk:  None    Suicide Risk:  Minimal: No identifiable suicidal ideation.  Patients presenting with no risk factors but with morbid ruminations; may be classified as minimal risk based on the severity of the depressive symptoms  [facility] Follow up.   Contact information: [place] [phone]           Plan Of Care/Follow-up recommendations:  Activity:  as tolerated. Diet:  low sodium heart healthy. Other:  keep follow up appointments.  Orson Slick, MD 03/12/2017, 8:47 AM

## 2017-03-12 NOTE — Plan of Care (Signed)
Problem: Coping: Goal: Ability to verbalize feelings will improve Outcome: Progressing Patient verbalized feelings to staff.    

## 2017-03-12 NOTE — Discharge Summary (Signed)
Physician Discharge Summary Note  Patient:  Miguel [name] is an 56 y.o., male MRN:  [id number] DOB:  05-03-1961 Patient phone:  [phone] (home)  Patient address:   [place]  [address],  Total Time spent with patient: 30 minutes  Date of Admission:  03/09/2017 Date of Discharge: 03/12/2017  Reason for Admission:  Suicide attempt.  Identifying data. [name] is a 56 year old male with history of alcoholism and depression.  Chief complaint. "I was getting ready to bring my wife home."  History of present illness. Information was obtained from the patient and the chart. The patient has a long history of alcohol abuse and mood instability. He has been under considerable stress lately as his wife who suffers MS developed new problems and has been in a rehabilitation facility for the past few weeks. He has been very attached to his wife, could not visit her frequently and due to financial problems and became increasingly depressed with poor sleep, decreased appetite and weight loss of 20 pounds, anhedonia, feeling of guilt and hopelessness worthlessness, poor energy and concentration, social isolation, crying spells. He started drinking heavily. He consumes a pine of vodka almost every day. In the past 3 weeks he has been to the emergency room almost 10 times for either drinking or drinking brief related injuries. On April 22, the day of admission to the emergency room, he was supposed to pick up his wife from the rehabilitation center. He got drunk instead, started cutting himself with a knife and screwdriver and then called EMS. This was his second visit to the emergency room that day. EMS workers found his kitchen table and is seeing covered in blood with a screwdriver on the table. He requires stitches for some of his wounds. He was transferred to psychiatry for further treatment. The patient denies psychotic symptoms or symptoms of severe anxiety. He does report racing  thoughts and difficulty sleeping. He denies symptoms suggestive of bipolar mania. He denies other than alcohol substance use. His blood alcohol level was 200 few days prior it was 360.  Past psychiatric history. The patient denies ever being hospitalized or treated for depression. He has never attempt substance abuse treatment. There were no suicide attempts. He has been drinking heavily for about 10 years. Recently he was able to stop for 4 months but relapsed beginning of the year.  Family psychiatric history. Father with alcoholism.  Social history. He has been married to his wife for 35 years. She is now disabled from [place] and her condition is worsening. Several years ago at the patient. 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 The support themselves from her disability income. Money is very tight.   Principal Problem: Severe recurrent major 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 [facility]) Discharge Diagnoses: Patient Active Problem List   Diagnosis Date Noted  . Suicide and self-inflicted injury by cutting and piercing instrument ([place]) [X78.9XXA] 03/10/2017  . Severe recurrent major 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 ([place]) [F33.2] 03/09/2017  . Substance induced mood disorder ([place]) [F19.94] 08/15/2016  . Involuntary commitment [Z04.6] 08/15/2016  . Alcohol use disorder, severe, dependence ([place]) [F10.20] 02/05/2016  . Hypertension [I10] 12/05/2015  . Tachycardia [R00.0] 12/05/2015  . Gout [M10.9] 11/13/2015  . Chronic back pain [M54.9, G89.29] 05/02/2015   Past Medical History:  Past Medical History:  Diagnosis Date  . Alcohol abuse   . Asthma   . GERD (gastroesophageal reflux disease)   . Gout   . Hypertension   .  OCD (obsessive compulsive disorder)   . Renal colic     Past Surgical History:  Procedure Laterality Date  . CHOLECYSTECTOMY  2012   Family History: History reviewed. No pertinent family history.  Social History:  History  Alcohol Use  . Yes     Comment: 1 pint of vodka a day     History  Drug Use No    Social History   Social History  . Marital status: Married    Spouse name: N/A  . Number of children: N/A  . Years of education: N/A   Social History Main Topics  . Smoking status: Former Research scientist (life sciences)  . Smokeless tobacco: Never Used  . Alcohol use Yes     Comment: 1 pint of vodka a day  . Drug use: No  . Sexual activity: Not Asked   Other Topics Concern  . None   Social History Narrative  . None    Hospital Course:    Miguel [name] is a 56 year old male with history of alcoholism admitted after suicide attempt by cutting and in the context of severe social stressors and drinking.  1. Suicidal ideation. Resolved. The patient is able to contract for safety. He is forward thinking and more optimistic about the future.   2. Mood. He was started on Prozac for depression and Risperdal for mood stabilization.  3. Alcohol detox. He completed Librium taper.  4. Substance abuse treatment. The patient declined residential treatment or pharmacotherapy for alcoholism. He is open to [facility] IOP at [facility]   5. Hypertension. He is on Lisinopril.  6. Insomnia. Trazodone was available.   7. COPD. He is on inhaler.  8. Metabolic syndrome monitoring. Lipid panel and TSH are normal. HgbA1C is pending.  9. EKG. Normal sinus rhythm, QTc 497.  10. Disposition. He was discharged to home. He will follow up with [facility].   Physical Findings: AIMS: Facial and Oral Movements Muscles of Facial Expression: None, normal Lips and Perioral Area: None, normal Jaw: None, normal Tongue: None, normal,Extremity Movements Upper (arms, wrists, hands, fingers): None, normal Lower (legs, knees, ankles, toes): None, normal, Trunk Movements Neck, shoulders, hips: None, normal, Overall Severity Severity of abnormal movements (highest score from questions above): None, normal Incapacitation due to abnormal movements: None, normal Patient's awareness of  abnormal movements (rate only patient's report): No Awareness, Dental Status Current problems with teeth and/or dentures?: No Does patient usually wear dentures?: No  CIWA:  CIWA-Ar Total: 0 COWS:     Musculoskeletal: Strength & Muscle Tone: within normal limits Gait & Station: normal Patient leans: N/A  Psychiatric Specialty Exam: Physical Exam  Nursing note and vitals reviewed. Psychiatric: His speech is normal and behavior is normal. Thought content normal. His mood appears anxious. Cognition and memory are normal. He expresses impulsivity.    Review of Systems  Psychiatric/Behavioral: Positive for substance abuse.  All other systems reviewed and are negative.   Blood pressure 99/74, pulse 96, temperature 97.7 F (36.5 C), temperature source Oral, resp. rate 18, height 6' (1.829 m), weight 84.4 kg (186 lb), SpO2 99 %.Body mass index is 25.23 kg/m.  General Appearance: Fairly Groomed  Eye Contact:  Good  Speech:  Clear and Coherent  Volume:  Normal  Mood:  Anxious  Affect:  Appropriate  Thought Process:  Goal Directed and Descriptions of Associations: Intact  Orientation:  Full (Time, Place, and Person)  Thought Content:  WDL  Suicidal Thoughts:  No  Homicidal Thoughts:  No  Memory:  Immediate;  Fair Recent;   Fair Remote;   Fair  Judgement:  Impaired  Insight:  Lacking  Psychomotor Activity:  Normal  Concentration:  Concentration: Fair and Attention Span: Fair  Recall:  [employer] of Knowledge:  Fair  Language:  Fair  Akathisia:  No  Handed:  Right  AIMS (if indicated):     Assets:  Communication Skills Desire for Improvement Housing Intimacy Physical Health Resilience Social Support Transportation  ADL's:  Intact  Cognition:  WNL  Sleep:  Number of Hours: 6.5     Have you used any form of tobacco in the last 30 days? (Cigarettes, Smokeless Tobacco, Cigars, and/or Pipes): No  Has this patient used any form of tobacco in the last 30 days? (Cigarettes,  Smokeless Tobacco, Cigars, and/or Pipes) Yes, No  Blood Alcohol level:  Lab Results  Component Value Date   ETH 200 (H) 03/09/2017   ETH 356 (HH) 24/26/8341    Metabolic Disorder Labs:  Lab Results  Component Value Date   HGBA1C 5.8 06/08/2014   No results found for: PROLACTIN Lab Results  Component Value Date   CHOL 144 03/12/2017   TRIG 107 03/12/2017   HDL 52 03/12/2017   CHOLHDL 2.8 03/12/2017   VLDL 21 03/12/2017   LDLCALC 71 03/12/2017   LDLCALC 70 06/27/2014    See Psychiatric Specialty Exam and Suicide Risk Assessment completed by Attending Physician prior to discharge.  Discharge destination:  Home  Is patient on multiple antipsychotic therapies at discharge:  No   Has Patient had three or more failed trials of antipsychotic monotherapy by history:  No  Recommended Plan for Multiple Antipsychotic Therapies: NA  Discharge Instructions    Diet - low sodium heart healthy    Complete by:  As directed    Increase activity slowly    Complete by:  As directed      Allergies as of 03/12/2017   No Known Allergies     Medication List    STOP taking these medications   colchicine 0.6 MG tablet   etodolac 200 MG capsule Commonly known as:  LODINE   HYDROcodone-acetaminophen 5-325 MG tablet Commonly known as:  NORCO/VICODIN   ibuprofen 600 MG tablet Commonly known as:  ADVIL,MOTRIN   ketorolac 10 MG tablet Commonly known as:  TORADOL   lidocaine 5 % Commonly known as:  LIDODERM   LORazepam 1 MG tablet Commonly known as:  ATIVAN   naproxen 500 MG EC tablet Commonly known as:  EC NAPROSYN   ondansetron 4 MG disintegrating tablet Commonly known as:  ZOFRAN ODT   ondansetron 4 MG tablet Commonly known as:  ZOFRAN   oxyCODONE-acetaminophen 5-325 MG tablet Commonly known as:  PERCOCET   potassium chloride 10 MEQ tablet Commonly known as:  K-DUR     TAKE these medications     Indication  albuterol 108 (90 Base) MCG/ACT inhaler Commonly  known as:  VENTOLIN HFA Inhale 2 puffs into the lungs every 4 (four) hours as needed for wheezing or shortness of breath.  Indication:  Chronic Obstructive Lung Disease   allopurinol 300 MG tablet Commonly known as:  ZYLOPRIM Take 300 mg by mouth every morning.  Indication:  Primary Gout   DEXILANT 60 MG capsule Generic drug:  dexlansoprazole Take 60 mg by mouth every morning.  Indication:  Gastroesophageal Reflux Disease   FLUoxetine 20 MG capsule Commonly known as:  PROZAC Take 1 capsule (20 mg total) by mouth daily.  Indication:  Major Depressive Disorder  Fluticasone-Salmeterol 100-50 MCG/DOSE Aepb Commonly known as:  ADVAIR Inhale 1 puff into the lungs 2 (two) times daily.  Indication:  Chronic Obstructive Lung Disease   lisinopril 20 MG tablet Commonly known as:  PRINIVIL,ZESTRIL Take 1 tablet (20 mg total) by mouth every morning.  Indication:  High Blood Pressure Disorder   omeprazole 20 MG capsule Commonly known as:  PRILOSEC Take 20 mg by mouth daily.  Indication:  Heartburn   risperiDONE 2 MG tablet Commonly known as:  RISPERDAL Take 1 tablet (2 mg total) by mouth 2 (two) times daily.  Indication:  Major Depressive Disorder   traZODone 50 MG tablet Commonly known as:  DESYREL Take 1 tablet (50 mg total) by mouth at bedtime as needed for sleep.  Indication:  [place] Follow up.   Contact information: [place] [address] [phone]           Follow-up recommendations:  Activity:  as tolerated. Diet:  low sodium heart healthy. Other:  keep follow up appointments.  Comments:    Signed: Orson Slick, MD 03/12/2017, 9:07 AM

## 2017-03-12 NOTE — Progress Notes (Signed)
Recreation Therapy Notes  INPATIENT RECREATION TR PLAN  Patient Details Name: LENZIE SANDLER MRN: [id number] DOB: 06-22-1961 Today's Date: 03/12/2017  Rec Therapy Plan Is patient appropriate for Therapeutic Recreation?: Yes Treatment times per week: At least once a week TR Treatment/Interventions: 1:1 session, Group participation (Comment) (Appropriate participation in daily recreational therapy tx)  Discharge Criteria Pt will be discharged from therapy if:: Treatment goals are met, Discharged Treatment plan/goals/alternatives discussed and agreed upon by:: Patient/family  Discharge Summary Short term goals set: See Care Plan Short term goals met: Adequate for discharge Progress toward goals comments: One-to-one attended Which groups?: Leisure education, Self-esteem One-to-one attended: Stress management, coping skills Reason goals not met: Patient d/c from hospital before goals could be met Therapeutic equipment acquired: None Reason patient discharged from therapy: Discharge from hospital Pt/family agrees with progress & goals achieved: Yes Date patient discharged from therapy: 03/12/17   Leonette Monarch, LRT/CTRS 03/12/2017, 11:54 AM

## 2017-03-12 NOTE — Progress Notes (Signed)
Affect blunted.  Denies SI/HI/AVH.  States I have to get home to take care of my wife.  Discharge instructions given, verbalized understand.  Prescriptions and seven day supply of medications given.  Personal belongings returned.  Escorted off unit by this Probation officer to main entrance to catch taxi ride home.

## 2017-03-12 NOTE — Progress Notes (Signed)
  [facility] Adult Case Management Discharge Plan :  Will you be returning to the same living situation after discharge:  Yes,  home At discharge, do you have transportation home?: Yes,  [employer] bus Do you have the ability to pay for your medications: Yes,  patient has ability to pay for medications.   Release of information consent forms completed and in the chart;  Patient's signature needed at discharge.  Patient to Follow up at: [facility] Follow up on 03/15/2017.   Why:  Follow--up appointment on this date for outpatient services. You will be meeting Lanae Boast, peer support specialist for [facility] at 7:15am. Bring photo i.d., discharge summary, and current medications. Contact [place] at  [phone]) with any questions. Contact information: [place] [address] [phone]           Next level of care provider has access to [place] and Suicide Prevention discussed: Yes,  with patient and wife  Have you used any form of tobacco in the last 30 days? (Cigarettes, Smokeless Tobacco, Cigars, and/or Pipes): No  Has patient been referred to the Quitline?: Patient refused referral  Patient has been referred for addiction treatment: Yes  Arber Wiemers G. Pilger, [place] 03/12/2017, 9:35 AM

## 2017-03-12 NOTE — Tx Team (Signed)
Interdisciplinary Treatment and Diagnostic Plan Update  03/12/2017 Time of Session: 10:30am Miguel [name] MRN: [id number]  Principal Diagnosis: Severe recurrent major 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 [facility])  Secondary Diagnoses: Principal Problem:   Severe recurrent major 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 ([place]) Active Problems:   Hypertension   Gout   Alcohol use disorder, severe, dependence ([place])   Substance induced mood disorder ([place])   Involuntary commitment   Suicide and self-inflicted injury by cutting and piercing instrument ([place])   Current Medications:  Current Facility-Administered Medications  Medication Dose Route Frequency Provider Last Rate Last Dose  . acetaminophen (TYLENOL) tablet 650 mg  650 mg Oral Q6H PRN Gonzella Lex, MD   650 mg at 03/12/17 0852  . albuterol (PROVENTIL HFA;VENTOLIN HFA) 108 (90 Base) MCG/ACT inhaler 2 puff  2 puff Inhalation Q4H PRN Gonzella Lex, MD      . alum & mag hydroxide-simeth (MAALOX/MYLANTA) 200-200-20 MG/5ML suspension 30 mL  30 mL Oral Q4H PRN Gonzella Lex, MD      . chlordiazePOXIDE (LIBRIUM) capsule 25 mg  25 mg Oral QID [place] Fredrickson, MD   25 mg at 03/12/17 0852  . FLUoxetine (PROZAC) capsule 20 mg  20 mg Oral Daily Gonzella Lex, MD   20 mg at 03/12/17 0852  . folic acid (FOLVITE) tablet 1 mg  1 mg Oral Daily Gonzella Lex, MD   1 mg at 03/12/17 0852  . lisinopril (PRINIVIL,ZESTRIL) tablet 20 mg  20 mg Oral Daily Gonzella Lex, MD   20 mg at 03/10/17 0831  . magnesium hydroxide (MILK OF MAGNESIA) suspension 30 mL  30 mL Oral Daily PRN Gonzella Lex, MD      . multivitamin with minerals tablet 1 tablet  1 tablet Oral Daily Gonzella Lex, MD   1 tablet at 03/12/17 [phone]  . risperiDONE (RISPERDAL) tablet 2 mg  2 mg Oral BID [place] Fredrickson, MD   2 mg at 03/12/17 0852  . thiamine (VITAMIN B-1) tablet 100 mg  100 mg Oral Daily Gonzella Lex, MD   100 mg at 03/12/17 4854   Or  . thiamine (B-1) injection 100 mg   100 mg Intravenous Daily Gonzella Lex, MD      . traZODone (DESYREL) tablet 50 mg  50 mg Oral QHS PRN [place] Fredrickson, MD       Current Outpatient Prescriptions  Medication Sig Dispense Refill  . albuterol (VENTOLIN HFA) 108 (90 Base) MCG/ACT inhaler Inhale 2 puffs into the lungs every 4 (four) hours as needed for wheezing or shortness of breath. 1 Inhaler 1  . allopurinol (ZYLOPRIM) 300 MG tablet Take 300 mg by mouth every morning.    Marland Kitchen dexlansoprazole (DEXILANT) 60 MG capsule Take 60 mg by mouth every morning.    Marland Kitchen FLUoxetine (PROZAC) 20 MG capsule Take 1 capsule (20 mg total) by mouth daily. 30 capsule 1  . Fluticasone-Salmeterol (ADVAIR) 100-50 MCG/DOSE AEPB Inhale 1 puff into the lungs 2 (two) times daily.    Marland Kitchen lisinopril (PRINIVIL,ZESTRIL) 20 MG tablet Take 1 tablet (20 mg total) by mouth every morning. 30 tablet 1  . omeprazole (PRILOSEC) 20 MG capsule Take 20 mg by mouth daily.    . risperiDONE (RISPERDAL) 2 MG tablet Take 1 tablet (2 mg total) by mouth 2 (two) times daily. 60 tablet 1  . traZODone (DESYREL) 50 MG tablet Take 1 tablet (50 mg total) by mouth at bedtime as needed for sleep. [place]  tablet 1   PTA Medications: No prescriptions prior to admission.    Patient Stressors: Marital or family conflict Substance abuse  Patient Strengths: Ability for Engineer, manufacturing fund of knowledge Motivation for treatment/growth  Treatment Modalities: Medication Management, Group therapy, Case management,  1 to 1 session with clinician, Psychoeducation, Recreational therapy.   Physician Treatment Plan for Primary Diagnosis: Severe recurrent major 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 ([place]) Long Term Goal(s): Improvement in symptoms so as ready for discharge Improvement in symptoms so as ready for discharge   Short Term Goals: Ability to identify changes in lifestyle to reduce recurrence of condition will improve Ability to verbalize feelings will  improve Ability to disclose and discuss suicidal ideas Ability to demonstrate self-control will improve Ability to identify and develop effective coping behaviors will improve Compliance with prescribed medications will improve Ability to identify triggers associated with substance abuse/mental health issues will improve Ability to identify changes in lifestyle to reduce recurrence of condition will improve Ability to demonstrate self-control will improve Ability to identify triggers associated with substance abuse/mental health issues will improve  Medication Management: Evaluate patient's response, side effects, and tolerance of medication regimen.  Therapeutic Interventions: 1 to 1 sessions, Unit Group sessions and Medication administration.  Evaluation of Outcomes: Adequate for Discharge  Physician Treatment Plan for Secondary Diagnosis: Principal Problem:   Severe recurrent major 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 ([place]) Active Problems:   Hypertension   Gout   Alcohol use disorder, severe, dependence ([place])   Substance induced mood disorder ([place])   Involuntary commitment   Suicide and self-inflicted injury by cutting and piercing instrument ([place])  Long Term Goal(s): Improvement in symptoms so as ready for discharge Improvement in symptoms so as ready for discharge   Short Term Goals: Ability to identify changes in lifestyle to reduce recurrence of condition will improve Ability to verbalize feelings will improve Ability to disclose and discuss suicidal ideas Ability to demonstrate self-control will improve Ability to identify and develop effective coping behaviors will improve Compliance with prescribed medications will improve Ability to identify triggers associated with substance abuse/mental health issues will improve Ability to identify changes in lifestyle to reduce recurrence of condition will improve Ability to demonstrate self-control will improve Ability to  identify triggers associated with substance abuse/mental health issues will improve     Medication Management: Evaluate patient's response, side effects, and tolerance of medication regimen.  Therapeutic Interventions: 1 to 1 sessions, Unit Group sessions and Medication administration.  Evaluation of Outcomes: Adequate for Discharge   RN Treatment Plan for Primary Diagnosis: Severe recurrent major 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 ([place]) Long Term Goal(s): Knowledge of disease and therapeutic regimen to maintain health will improve  Short Term Goals: Ability to demonstrate self-control, Ability to verbalize feelings will improve and Ability to disclose and discuss suicidal ideas  Medication Management: RN will administer medications as ordered by provider, will assess and evaluate patient's response and provide education to patient for prescribed medication. RN will report any adverse and/or side effects to prescribing provider.  Therapeutic Interventions: 1 on 1 counseling sessions, Psychoeducation, Medication administration, Evaluate responses to treatment, Monitor vital signs and CBGs as ordered, Perform/monitor CIWA, COWS, AIMS and Fall Risk screenings as ordered, Perform wound care treatments as ordered.  Evaluation of Outcomes: Adequate for Discharge   LCSW Treatment Plan for Primary Diagnosis: Severe recurrent major 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 ([place]) Long Term Goal(s): Safe transition to appropriate next level of care at discharge, Engage patient in therapeutic group addressing  interpersonal concerns.  Short Term Goals: Engage patient in aftercare planning with referrals and resources, Increase social support, Increase ability to appropriately verbalize feelings and Increase emotional regulation  Therapeutic Interventions: Assess for all discharge needs, 1 to 1 time with Social worker, Explore available resources and support systems, Assess for adequacy in community  support network, Educate family and significant other(s) on suicide prevention, Complete Psychosocial Assessment, Interpersonal group therapy.  Evaluation of Outcomes: Adequate for Discharge   Progress in Treatment: Attending groups: Yes. Participating in groups: Yes. Taking medication as prescribed: Yes. Toleration medication: Yes. Family/Significant other contact made: Yes, individual(s) contacted:  wife Patient understands diagnosis: Yes. Discussing patient identified problems/goals with staff: Yes. Medical problems stabilized or resolved: Yes. Denies suicidal/homicidal ideation: Yes. Issues/concerns per patient self-inventory: No. Other: n/a  New problem(s) identified: None identified at this time.   New Short Term/Long Term Goal(s): None identified at this time.   Discharge Plan or Barriers: Patient will discharge home and follow-up with [facility] for the substance abuse intensive outpatient program for outpatient services.   Reason for Continuation of Hospitalization:  Discharged 03/12/2017  Estimated Length of Stay: Discharged 03/12/2017  Attendees: Patient: 03/12/2017 2:46 PM  Physician: Dr. Orson Slick, MD 03/12/2017 2:46 PM  Nursing: Tyler Pita, RN 03/12/2017 2:46 PM  RN Care Manager: 03/12/2017 2:46 PM  Social Worker: Lear Ng. Claybon Jabs MSW, [place] 03/12/2017 2:46 PM  Recreational Therapist: Leonette Monarch, LRT/CTRS 03/12/2017 2:46 PM  Other:  03/12/2017 2:46 PM  Other:  03/12/2017 2:46 PM  Other: 03/12/2017 2:46 PM    Scribe for Treatment Team: Jolaine Click, LCSWA 03/12/2017 2:50 PM

## 2017-03-12 NOTE — [facility] Group Notes (Signed)
[place] Group Notes:  (Nursing/MHT/Case Management/Adjunct)  Date:  03/12/2017  Time:  2:10 AM  Type of Therapy:  Group Therapy  Participation Level:  Did Not Attend  Participation Quality:   Summary of Progress/Problems:  Miguel [name] 03/12/2017, 2:10 AM

## 2017-03-13 LAB — HEMOGLOBIN A1C
Hgb A1c MFr Bld: 5.1 % (ref 4.8–5.6)
Mean Plasma Glucose: 100 mg/dL

## 2017-03-18 ENCOUNTER — Encounter: Payer: Self-pay | Admitting: Intensive Care

## 2017-03-18 ENCOUNTER — Emergency Department
Admission: EM | Admit: 2017-03-18 | Discharge: 2017-03-18 | Disposition: A | Discharge status: Home / Self Care | Payer: No Typology Code available for payment source | Attending: Emergency Medicine | Admitting: Emergency Medicine

## 2017-03-18 DIAGNOSIS — W2201XD Walked into wall, subsequent encounter: Secondary | ICD-10-CM | POA: Insufficient documentation

## 2017-03-18 DIAGNOSIS — Z87891 Personal history of nicotine dependence: Secondary | ICD-10-CM | POA: Insufficient documentation

## 2017-03-18 DIAGNOSIS — Z4802 Encounter for removal of sutures: Secondary | ICD-10-CM

## 2017-03-18 DIAGNOSIS — T148XXA Other injury of unspecified body region, initial encounter: Secondary | ICD-10-CM

## 2017-03-18 DIAGNOSIS — Z79899 Other long term (current) drug therapy: Secondary | ICD-10-CM | POA: Insufficient documentation

## 2017-03-18 DIAGNOSIS — I10 Essential (primary) hypertension: Secondary | ICD-10-CM | POA: Insufficient documentation

## 2017-03-18 DIAGNOSIS — S40021D Contusion of right upper arm, subsequent encounter: Secondary | ICD-10-CM

## 2017-03-18 DIAGNOSIS — J45909 Unspecified asthma, uncomplicated: Secondary | ICD-10-CM | POA: Insufficient documentation

## 2017-03-18 LAB — COMPREHENSIVE METABOLIC PANEL
ALT: 18 U/L (ref 17–63)
AST: 18 U/L (ref 15–41)
Albumin: 3.1 g/dL — ABNORMAL LOW (ref 3.5–5.0)
Alkaline Phosphatase: 77 U/L (ref 38–126)
Anion gap: 6 (ref 5–15)
BUN: 19 mg/dL (ref 6–20)
CO2: 26 mmol/L (ref 22–32)
Calcium: 8.6 mg/dL — ABNORMAL LOW (ref 8.9–10.3)
Chloride: 107 mmol/L (ref 101–111)
Creatinine, Ser: 0.9 mg/dL (ref 0.61–1.24)
GFR calc Af Amer: >60 mL/min (ref >60)
GFR calc non Af Amer: >60 mL/min (ref >60)
Glucose, Bld: 107 mg/dL — ABNORMAL HIGH (ref 65–99)
Potassium: 3.7 mmol/L (ref 3.5–5.1)
Sodium: 139 mmol/L (ref 135–145)
Total Bilirubin: 0.6 mg/dL (ref 0.3–1.2)
Total Protein: 5.5 g/dL — ABNORMAL LOW (ref 6.5–8.1)

## 2017-03-18 LAB — URINALYSIS, COMPLETE (UACMP) WITH MICROSCOPIC
Bacteria, UA: NONE SEEN
Bilirubin Urine: NEGATIVE
Glucose, UA: NEGATIVE mg/dL
Hgb urine dipstick: NEGATIVE
Ketones, ur: NEGATIVE mg/dL
Nitrite: NEGATIVE
Protein, ur: NEGATIVE mg/dL
Specific Gravity, Urine: 1.026 (ref 1.005–1.030)
pH: 5 (ref 5.0–8.0)

## 2017-03-18 LAB — CBC WITH DIFFERENTIAL/PLATELET
Basophils Absolute: 0 10*3/uL (ref 0–0.1)
Basophils Relative: 1 %
Eosinophils Absolute: 0.1 10*3/uL (ref 0–0.7)
Eosinophils Relative: 1 %
HCT: 35.6 % — ABNORMAL LOW (ref 40.0–52.0)
Hemoglobin: 11.9 g/dL — ABNORMAL LOW (ref 13.0–18.0)
Lymphocytes Relative: 16 %
Lymphs Abs: 1.3 10*3/uL (ref 1.0–3.6)
MCH: 31.1 pg (ref 26.0–34.0)
MCHC: 33.3 g/dL (ref 32.0–36.0)
MCV: 93.2 fL (ref 80.0–100.0)
Monocytes Absolute: 1.2 10*3/uL — ABNORMAL HIGH (ref 0.2–1.0)
Monocytes Relative: 15 %
Neutro Abs: 5.2 10*3/uL (ref 1.4–6.5)
Neutrophils Relative %: 67 %
Platelets: 238 10*3/uL (ref 150–440)
RBC: 3.82 MIL/uL — ABNORMAL LOW (ref 4.40–5.90)
RDW: 16.5 % — ABNORMAL HIGH (ref 11.5–14.5)
WBC: 7.8 10*3/uL (ref 3.8–10.6)

## 2017-03-18 LAB — LACTIC ACID, PLASMA: Lactic Acid, Venous: 1.2 mmol/L (ref 0.5–1.9)

## 2017-03-18 NOTE — ED Provider Notes (Signed)
[facility] Emergency Department Provider Note  ____________________________________________   First MD Initiated Contact with Patient 03/18/17 1430     (approximate)  I have reviewed the triage vital signs and the nursing notes.   HISTORY  Chief Complaint Wound Infection    HPI Miguel [name] is a 56 y.o. male with an extensive history of alcohol abuse and numerous emergency Department visits who presents for evaluation of a subacute injury to his right upper extremity.  When he was here about 11 days ago prior to his last psychiatric admission he had a small laceration to his right dorsal forearm and a deep laceration to the medial aspect of his right lower leg, both self-inflicted.  Both of these wounds were sutured.   The laceration to his right forearm was in addition to also punching a wall for which she was evaluated, not found to have any broken bones, but had a significant amount of swelling.  He is out of the hospital and recovering from his alcoholism and going to [place].  He presents today because he says that the nurse at [facility] was concerned about the bruising on his right forearm which she says could be a blood clot she scared him by saying "do you want to lose your arm?".  He says that the bruising to the ulnar aspect of his right forearm has actually been improving.  He is to have bruising extending from the wrist all the way to the elbow but it is receding from the rest.  He still has some bluish bruising to the proximal right forearm.  He also has some swelling and minimal tenderness around the small laceration on the dorsal aspect of his right extremity.  He also has not had the sutures removed from his right lower leg and states that [facility].  He states that the pain at the site of the bruising is mild, worse when he touches it, better at rest.  He states that all of it seems to  be getting better.  There is no excessive warmth to the area and that the swelling that was present is getting better as well.  He denies fever/chills, chest pain, shortness of breath, nausea, vomiting, abdominal pain.  Past Medical History:  Diagnosis Date  . Alcohol abuse   . Asthma   . GERD (gastroesophageal reflux disease)   . Gout   . Hypertension   . OCD (obsessive compulsive disorder)   . Renal colic     Patient Active Problem List   Diagnosis Date Noted  . Suicide and self-inflicted injury by cutting and piercing instrument ([place]) 03/10/2017  . Severe recurrent major 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 ([place]) 03/09/2017  . Substance induced mood disorder ([name]) 08/15/2016  . Involuntary commitment 08/15/2016  . Alcohol use disorder, severe, dependence ([place]) 02/05/2016  . Hypertension 12/05/2015  . Tachycardia 12/05/2015  . Gout 11/13/2015  . Chronic back pain 05/02/2015    Past Surgical History:  Procedure Laterality Date  . CHOLECYSTECTOMY  2012    Prior to Admission medications   Medication Sig Start Date End Date Taking? Authorizing Provider  albuterol (VENTOLIN HFA) 108 (90 Base) MCG/ACT inhaler Inhale 2 puffs into the lungs every 4 (four) hours as needed for wheezing or shortness of breath. 03/12/17   Jolanta B Pucilowska, MD  allopurinol (ZYLOPRIM) 300 MG tablet Take 300 mg by mouth every morning.  Historical Provider, MD  dexlansoprazole (DEXILANT) 60 MG capsule Take 60 mg by mouth every morning.    Historical Provider, MD  FLUoxetine (PROZAC) 20 MG capsule Take 1 capsule (20 mg total) by mouth daily. 03/12/17   Clovis Fredrickson, MD  Fluticasone-Salmeterol (ADVAIR) 100-50 MCG/DOSE AEPB Inhale 1 puff into the lungs 2 (two) times daily.    Historical Provider, MD  lisinopril (PRINIVIL,ZESTRIL) 20 MG tablet Take 1 tablet (20 mg total) by mouth every morning. 03/12/17   Clovis Fredrickson, MD  omeprazole (PRILOSEC) 20 MG capsule Take 20 mg by mouth daily.     Historical Provider, MD  risperiDONE (RISPERDAL) 2 MG tablet Take 1 tablet (2 mg total) by mouth 2 (two) times daily. 03/12/17   Clovis Fredrickson, MD  traZODone (DESYREL) 50 MG tablet Take 1 tablet (50 mg total) by mouth at bedtime as needed for sleep. 03/12/17   Clovis Fredrickson, MD    Allergies Patient has no known allergies.  History reviewed. No pertinent family history.  Social History Social History  Substance Use Topics  . Smoking status: Former Research scientist (life sciences)  . Smokeless tobacco: Never Used  . Alcohol use No     Comment: HX Alcohol Abuse. Last drink 03/15/17    Review of Systems Constitutional: No fever/chills Eyes: No visual changes. ENT: No sore throat. Cardiovascular: Denies chest pain. Respiratory: Denies shortness of breath. Gastrointestinal: No abdominal pain.  No nausea, no vomiting.  No diarrhea.  No constipation. Genitourinary: Negative for dysuria. Musculoskeletal: Pain in right forearm.  Negative for back pain. Integumentary: Negative for rash.  Gradually improving bruising in the right forearm.  Well-healing lacerations in the right lower leg and right forearm Neurological: Negative for headaches, focal weakness or numbness.   ____________________________________________   PHYSICAL EXAM:  VITAL SIGNS: ED Triage Vitals  Enc Vitals Group     BP 03/18/17 1245 (!) 167/101     Pulse Rate 03/18/17 1245 88     Resp 03/18/17 1245 16     Temp 03/18/17 1245 98.4 F (36.9 C)     Temp Source 03/18/17 1245 Oral     SpO2 03/18/17 1245 96 %     Weight 03/18/17 1246 186 lb (84.4 kg)     Height 03/18/17 1246 6' (1.829 m)     Head Circumference --      Peak Flow --      Pain Score --      Pain Loc --      Pain Edu? --      Excl. in [place]? --     Constitutional: Alert and oriented. Well appearing and in no acute distress. Eyes: Conjunctivae are normal. PERRL. EOMI. Head: Atraumatic. Nose: No congestion/rhinnorhea. Mouth/Throat: Mucous membranes are  moist. Neck: No stridor.  No meningeal signs.   Cardiovascular: Normal rate, regular rhythm. Good peripheral circulation. Grossly normal heart sounds. Respiratory: Normal respiratory effort.  No retractions. Lungs CTAB. Gastrointestinal: Soft and nontender. No distention.  Musculoskeletal: The patient has some minimal swelling around the several centimeter well healed laceration to the right dorsal forearm.  There is no evidence of infection.  On the ulnar side of the forearm there is subacute bruising that is clearly present in various stages of healing but appears to be receding from the wrist as he described.  Both forearms are equally warm, radial pulses are easily palpable, and there is no significant amount of swelling, erythema, nor petechiae that would suggest an acute process such as a DVT of  the upper extremity. Neurologic:  Normal speech and language. No gross focal neurologic deficits are appreciated.  Skin:  Skin is warm, dry and intact.  Bruising on the right forearm as noted above.  Well healed lacerations to the medial right lower leg and the dorsal right forearm Psychiatric: Mood and affect are normal. Speech and behavior are normal.  ____________________________________________   LABS (all labs ordered are listed, but only abnormal results are displayed)  Labs Reviewed  COMPREHENSIVE METABOLIC PANEL - Abnormal; Notable for the following:       Result Value   Glucose, Bld 107 (*)    Calcium 8.6 (*)    Total Protein 5.5 (*)    Albumin 3.1 (*)    All other components within normal limits  CBC WITH DIFFERENTIAL/PLATELET - Abnormal; Notable for the following:    RBC 3.82 (*)    Hemoglobin 11.9 (*)    HCT 35.6 (*)    RDW 16.5 (*)    Monocytes Absolute 1.2 (*)    All other components within normal limits  URINALYSIS, COMPLETE (UACMP) WITH MICROSCOPIC - Abnormal; Notable for the following:    Color, Urine YELLOW (*)    APPearance HAZY (*)    Leukocytes, UA SMALL (*)     Squamous Epithelial / LPF 0-5 (*)    All other components within normal limits  LACTIC ACID, PLASMA   ____________________________________________  EKG  None - EKG not ordered by ED physician ____________________________________________  RADIOLOGY   No results found.  ____________________________________________   PROCEDURES  Critical Care performed: No   Procedure(s) performed:   Procedures   ____________________________________________   INITIAL IMPRESSION / ASSESSMENT AND PLAN / ED COURSE  Pertinent labs & imaging results that were available during my care of the patient were reviewed by me and considered in my medical decision making (see chart for details).  The patient looks as well as I have ever seen him, sober and in no acute distress.  He is here to be evaluated for the bruising in his right forearm but I am not concerned that this is an acute or emergent process such as DVT.  I believe that his arm is healing and by its appearance and by his own report it has been gradually healing since his last hospitalization.  There is no indication for ultrasound at this time.  He is neurovascularly intact with no signs or symptoms of DVT.  Because the patient states he does not have an appointment to have his sutures removed for another 3 weeks and he is traditionally unreliable in terms of follow-up, we will remove the sutures today to avoid having them to come in ground and possibly infected.  I gave him my usual and customary return precautions and management recommendations.      ____________________________________________  FINAL CLINICAL IMPRESSION(S) / ED DIAGNOSES  Final diagnoses:  Bruising  Encounter for removal of sutures  Contusion of right upper extremity, subsequent encounter     MEDICATIONS GIVEN DURING THIS VISIT:  Medications - No data to display   NEW OUTPATIENT MEDICATIONS STARTED DURING THIS VISIT:  New Prescriptions   No medications  on file    Modified Medications   No medications on file    Discontinued Medications   No medications on file     Note:  This document was prepared using Dragon voice recognition software and may include unintentional dictation errors.    Hinda Kehr, MD 03/18/17 [phone]

## 2017-03-18 NOTE — ED Triage Notes (Addendum)
Patient presents to ER with swelling, warm to touch, and petechia in R forearm. Last week patient had stitches to R arm after cutting his arm with a knife

## 2017-03-18 NOTE — Discharge Instructions (Signed)
Your workup in the Emergency Department today was reassuring.  We did not find any specific abnormalities.  We do not think you are suffering from a blood clot in your arm - you had some bruising and injuries when you came to the ED more than a week ago, and these can take time to heal.  We recommend you drink plenty of fluids, take your regular medications and/or any new ones prescribed today, and follow up with the doctor(s) listed in these documents as recommended.  Return to the Emergency Department if you develop new or worsening symptoms that concern you.

## 2017-03-18 NOTE — ED Notes (Signed)
Sutures removed from right arm and from right lower leg per Dr. Karma Greaser

## 2017-03-19 ENCOUNTER — Other Ambulatory Visit: Payer: Self-pay | Admitting: Internal Medicine

## 2017-03-22 ENCOUNTER — Telehealth: Payer: Self-pay | Admitting: Pharmacist

## 2017-03-22 NOTE — Telephone Encounter (Signed)
03/22/17 Called [facility] for refills on Dexilant 60mg  & Colcrys 0.6mg .

## 2017-04-22 IMAGING — CR DG RIBS W/ CHEST 3+V*L*
1 series · 3 of 3 positions shown · non-contrast
Comparison: 01/28/2016

CLINICAL DATA: Fall.  Left anterior rib pain

EXAM:
LEFT RIBS AND CHEST - 3+ VIEW

[Series 1: dg ribs unilateral w/chest left · 0.14mm/px · 3 of 3 slices shown]
[im 1/3]
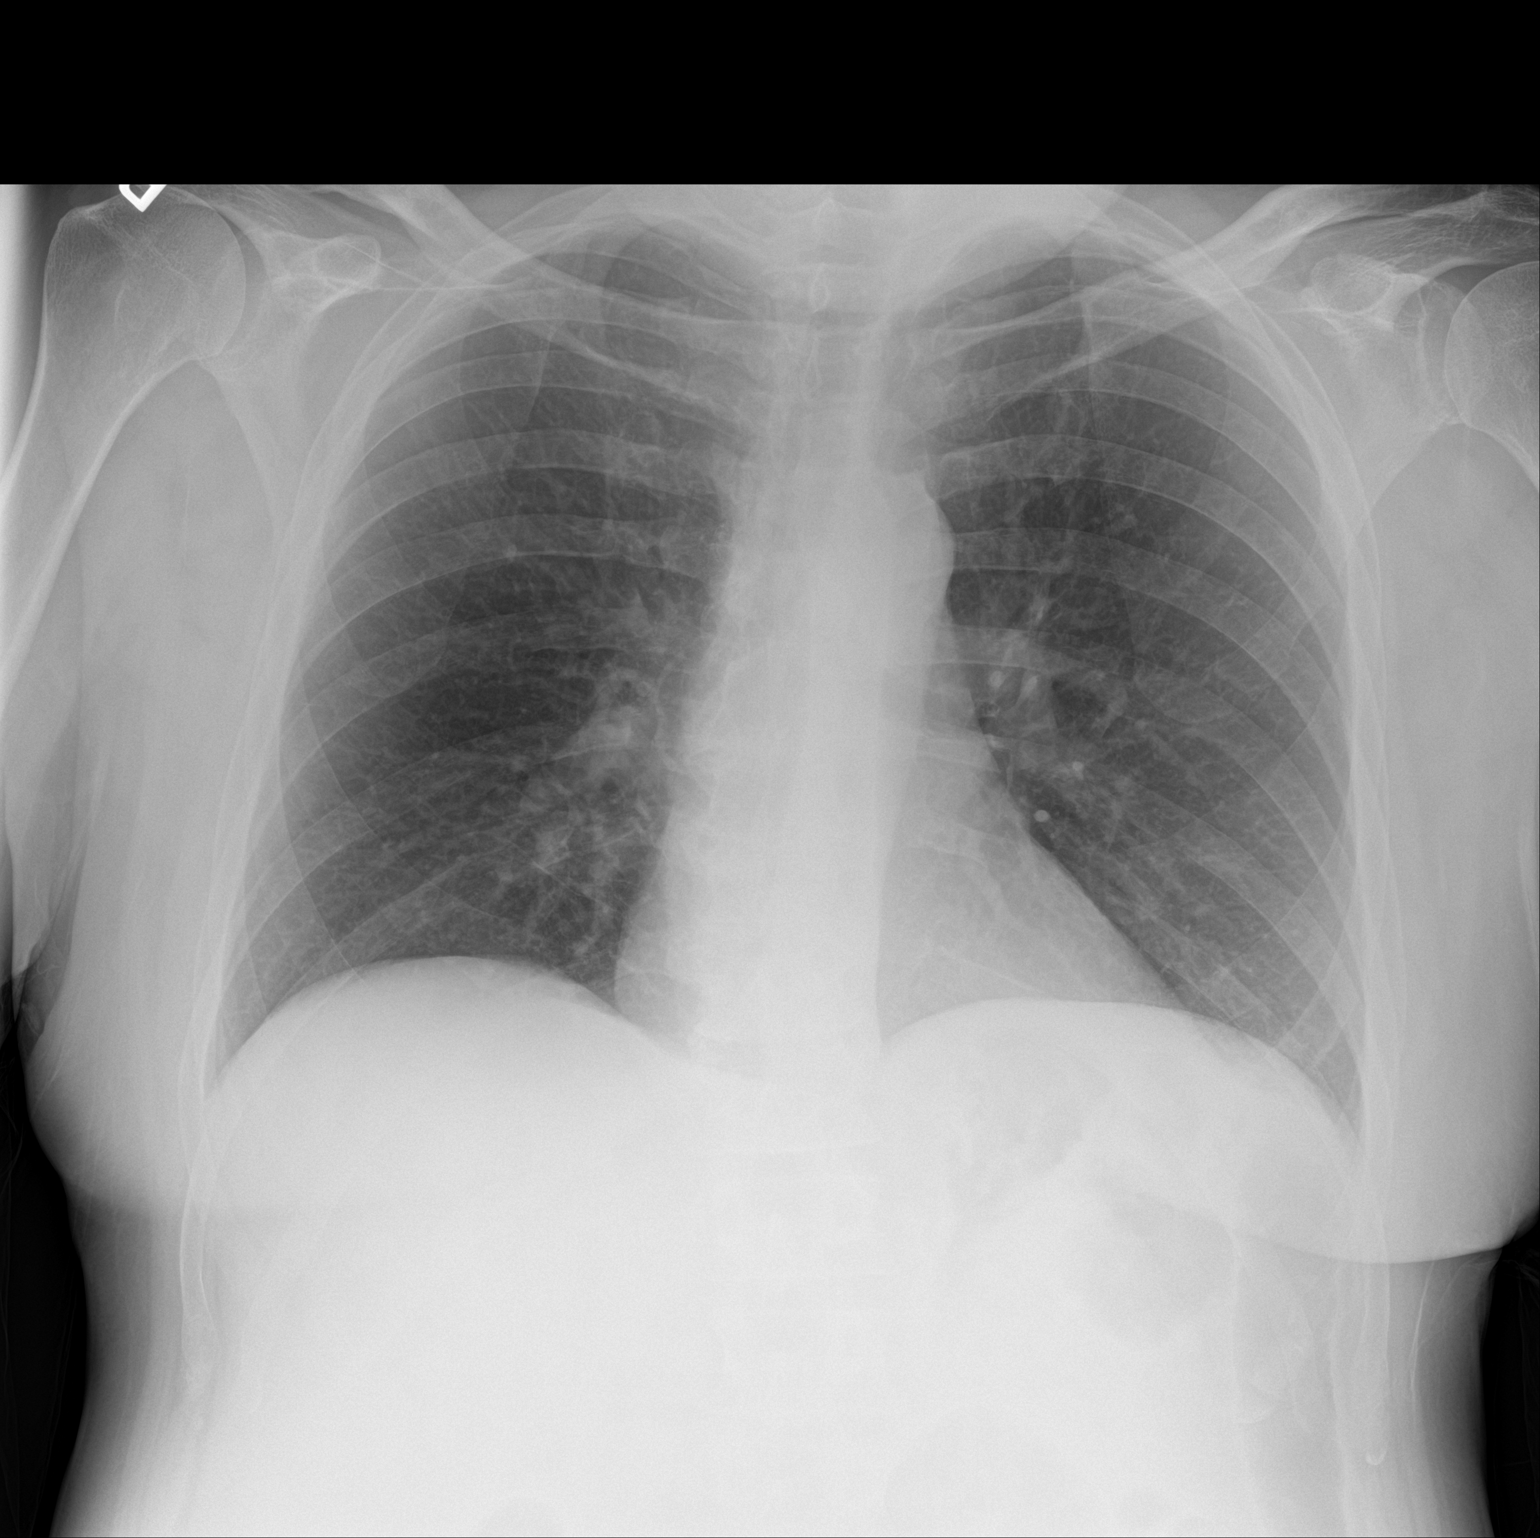
[im 2/3]
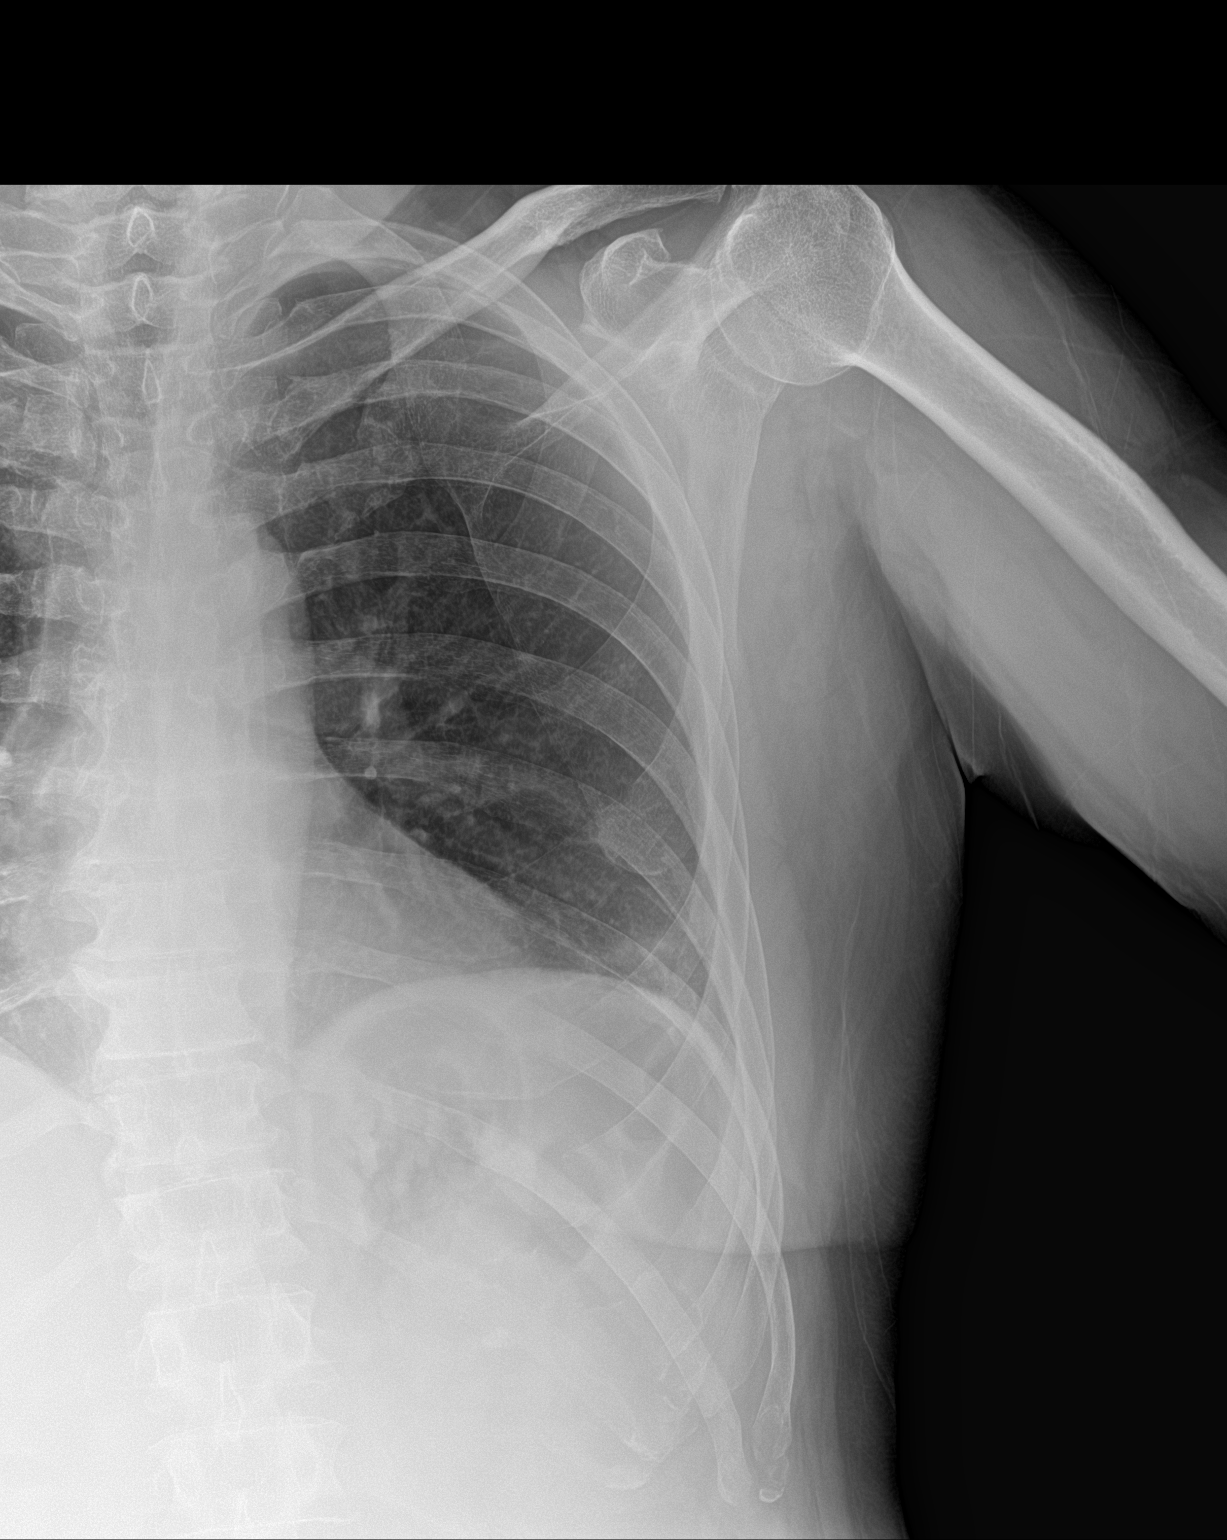
[im 3/3]
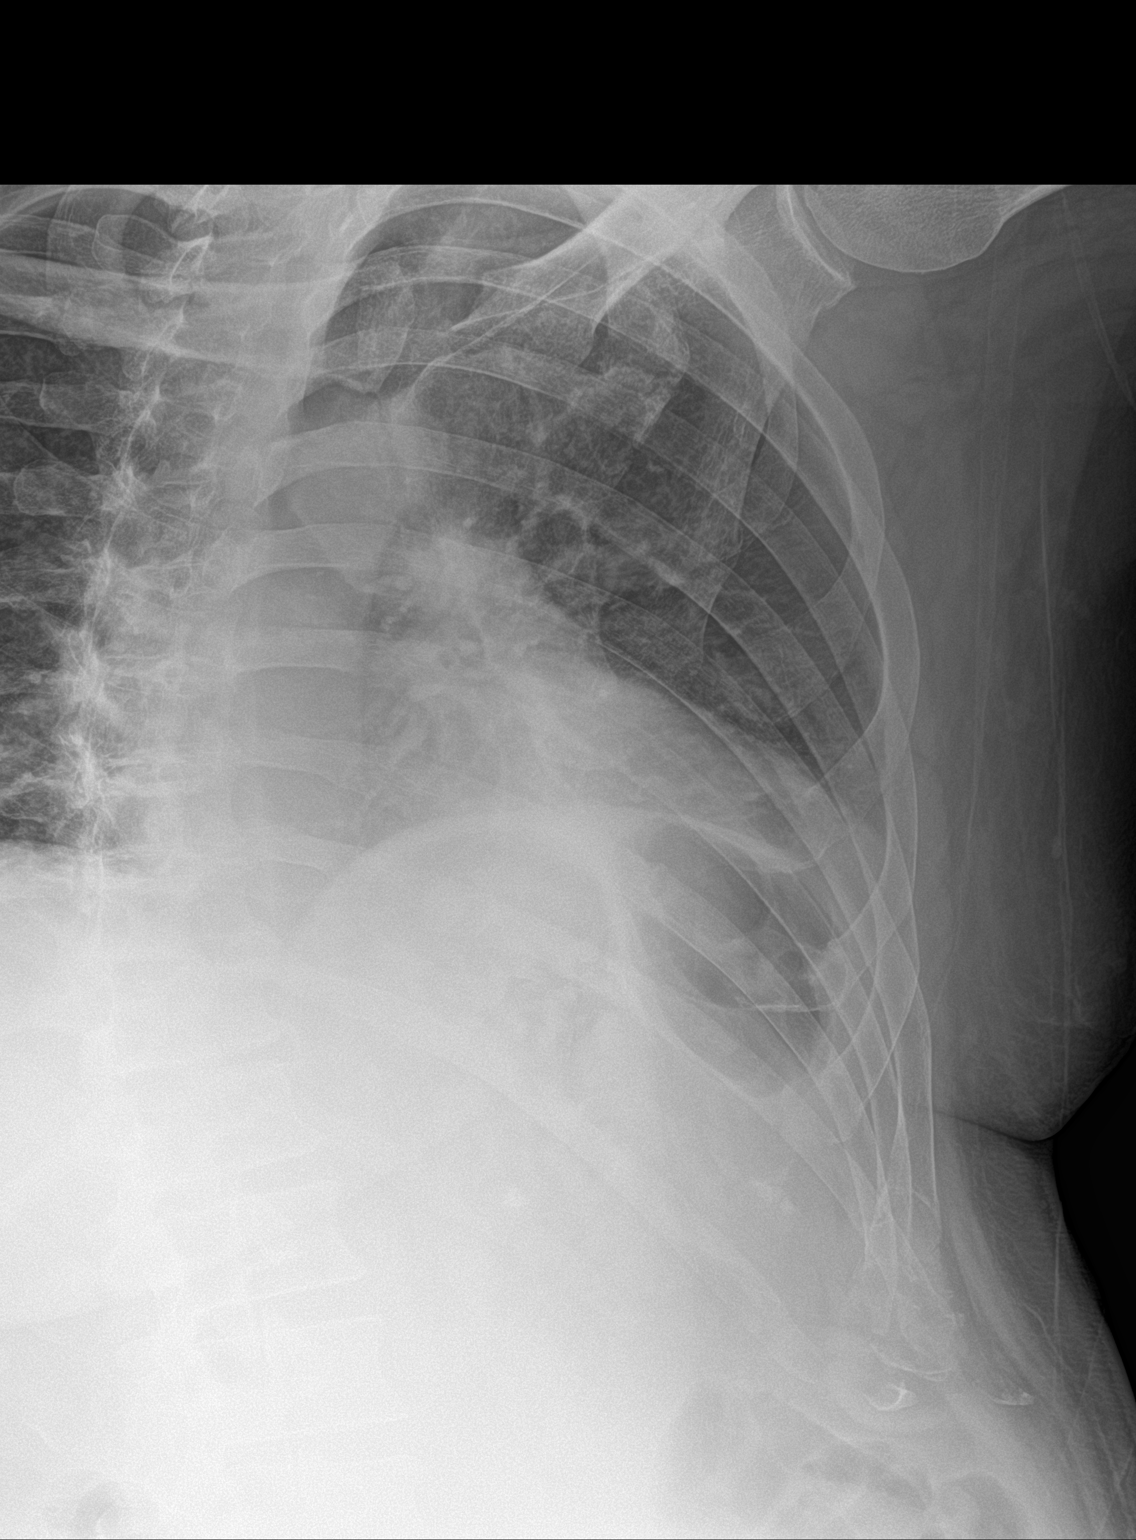

[3 of 3 positions shown; findings below may reference images not displayed]

FINDINGS: No fracture or other bone lesions are seen involving the ribs. There
is no evidence of pneumothorax or pleural effusion. Both lungs are
clear. Heart size and mediastinal contours are within normal limits.
IMPRESSION: Negative.

## 2017-04-27 IMAGING — CT CT CHEST W/ CM
2 of 3 series · 15 of 36 positions shown, 18 images · IV contrast (iopamidol)
Comparison: Radiographs 07/22/2016

CLINICAL DATA: Fell several days ago, striking his chest.

EXAM:
CT CHEST WITH CONTRAST
TECHNIQUE: Multidetector CT imaging of the chest was performed during
intravenous contrast administration.
CONTRAST:  75mL IPE916-CKK IOPAMIDOL (IPE916-CKK) INJECTION 61%

[Series 2: axial st · axial · 0.87mm/px · z∈[-278,-34]mm · 12 of 144 slices shown, 15 images]
[im 11/144  mediastinal]
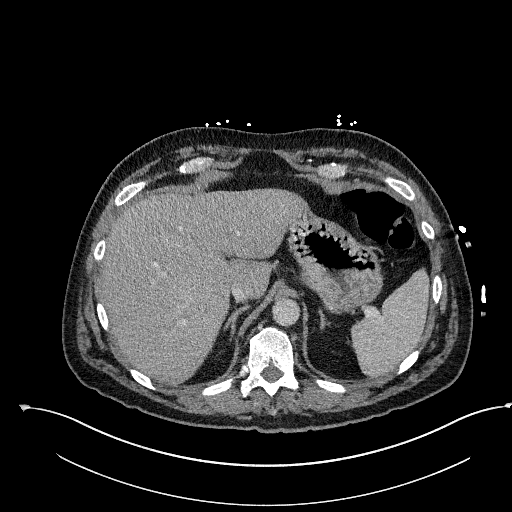
[im 11/144  lung]
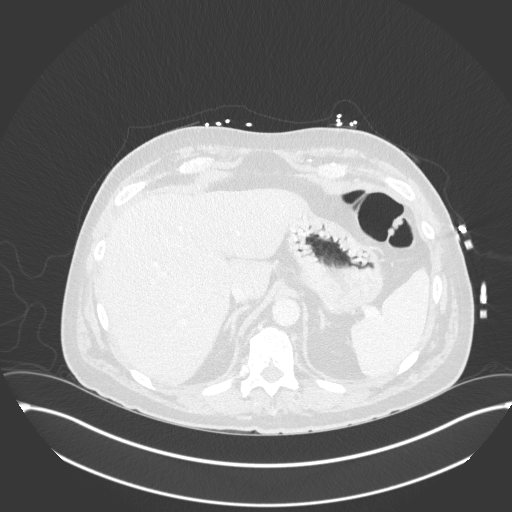
[im 22/144  lung]
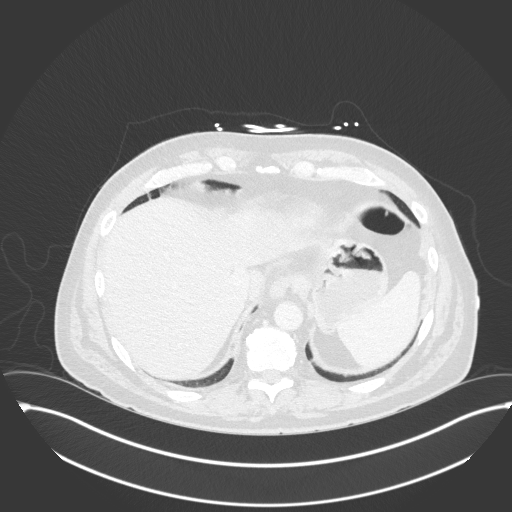
[im 32/144  lung]
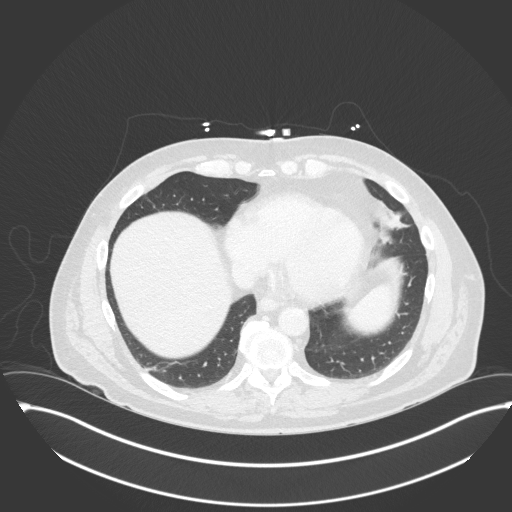
[im 43/144  lung]
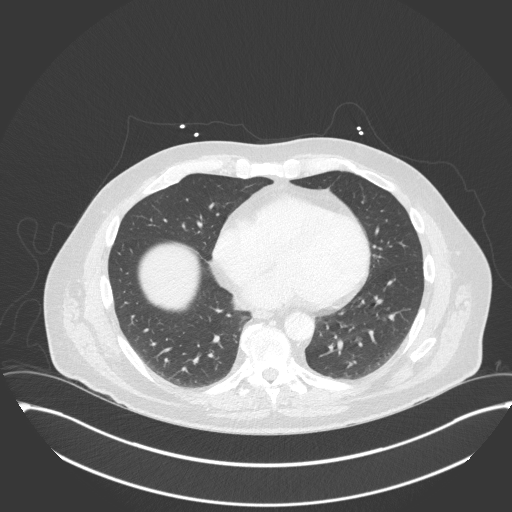
[im 53/144  mediastinal]
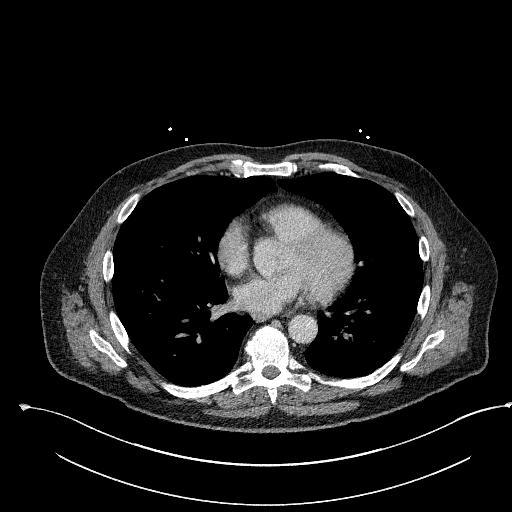
[im 53/144  lung]
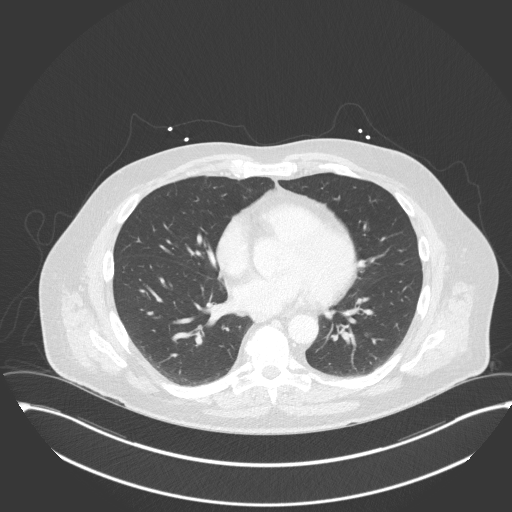
[im 64/144  lung]
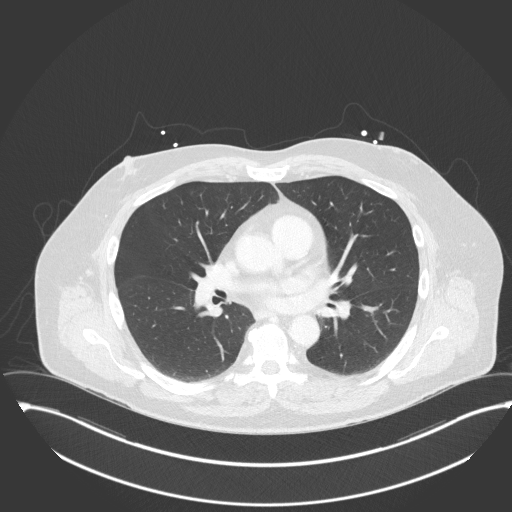
[im 80/144  lung]
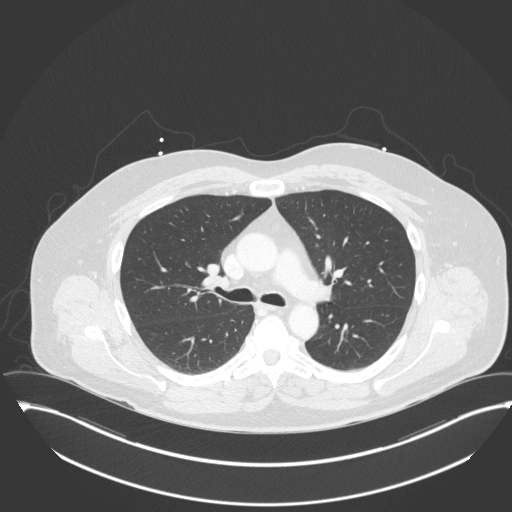
[im 91/144  lung]
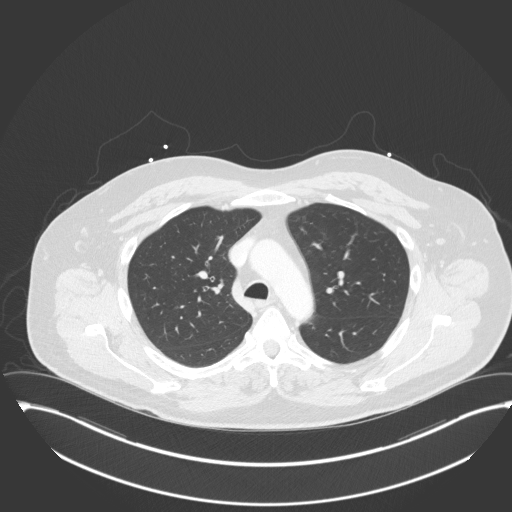
[im 101/144  mediastinal]
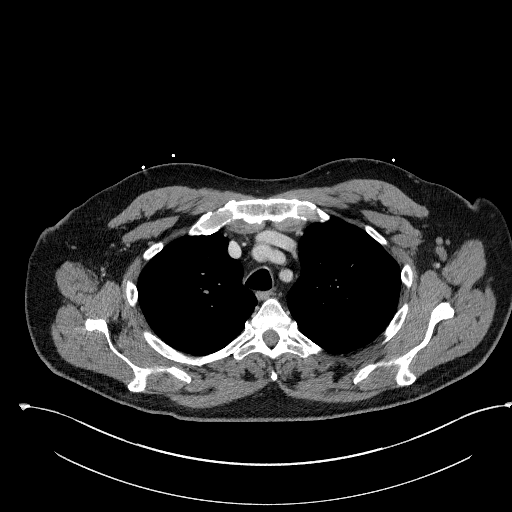
[im 101/144  lung]
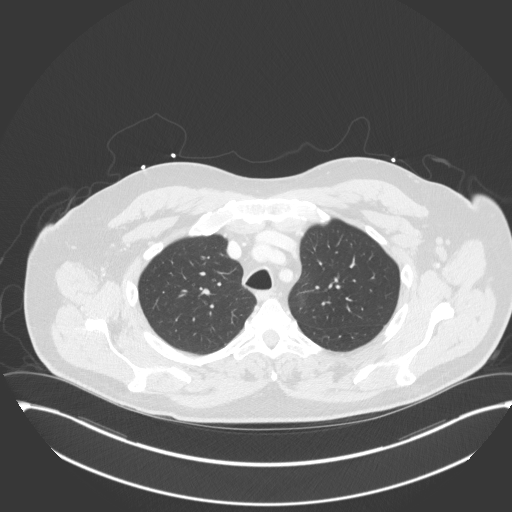
[im 112/144  lung]
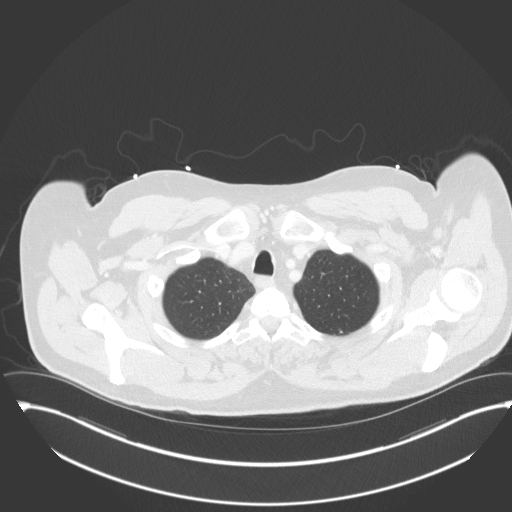
[im 122/144  lung]
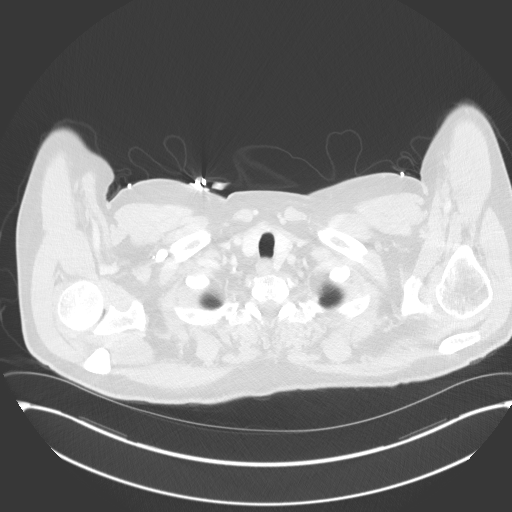
[im 133/144  lung]
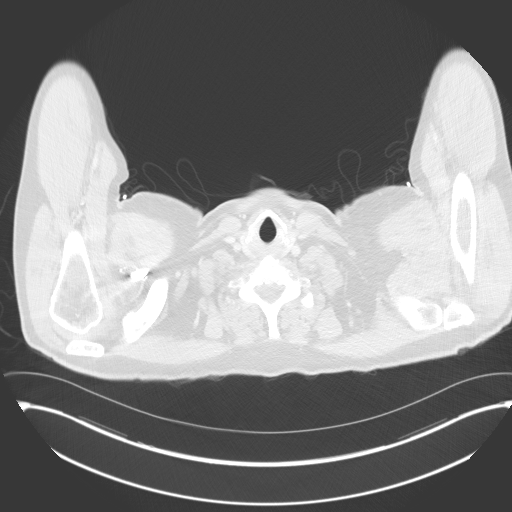

[Series 5: coronal · coronal · 0.55mm/px · 3 of 120 slices shown]
[im 24/120  lung]
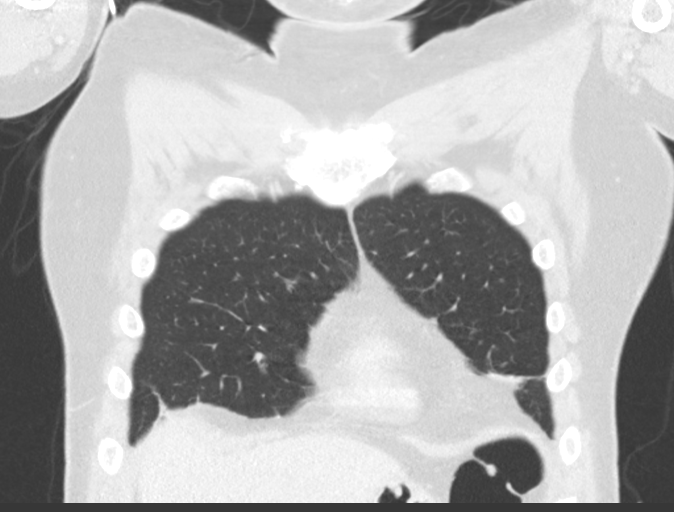
[im 48/120  lung]
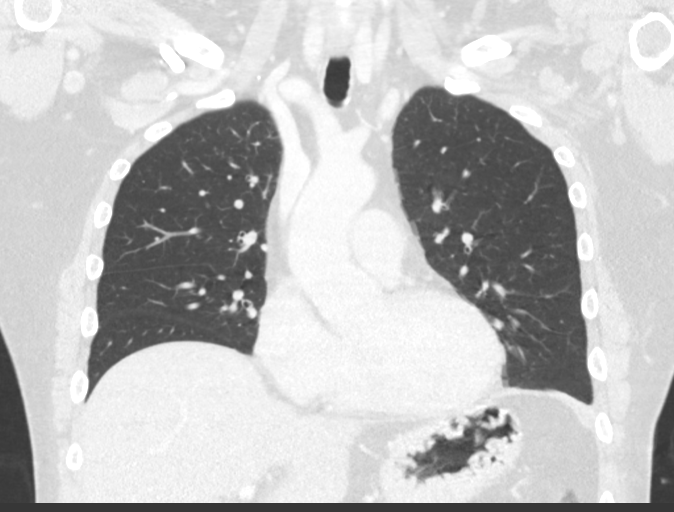
[im 72/120  lung]
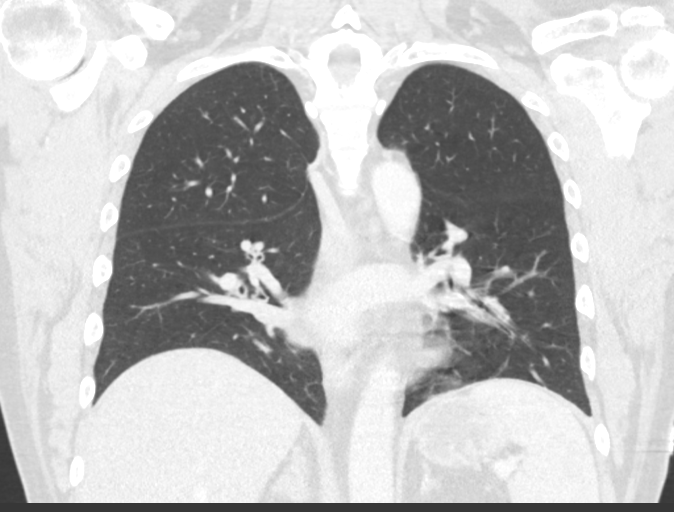

[15 of 36 positions shown; findings below may reference images not displayed]

FINDINGS: Cardiovascular: No intrathoracic vascular injury. The thoracic aorta
is normal in caliber and intact.

Mediastinum/Nodes: Normal mediastinum and hila.

Lungs/Pleura: The lungs are clear. Central airways are patent and
intact. No pneumothorax. No effusion.

Upper Abdomen: No significant abnormality

Musculoskeletal: No fractures are evident.
IMPRESSION: No significant abnormality

## 2017-05-13 IMAGING — DX DG CHEST 1V
1 series · 1 of 1 positions shown · non-contrast
Comparison: None.

CLINICAL DATA: Left-sided rib pain for 3 weeks

EXAM:
CHEST 1 VIEW

[chest ap]
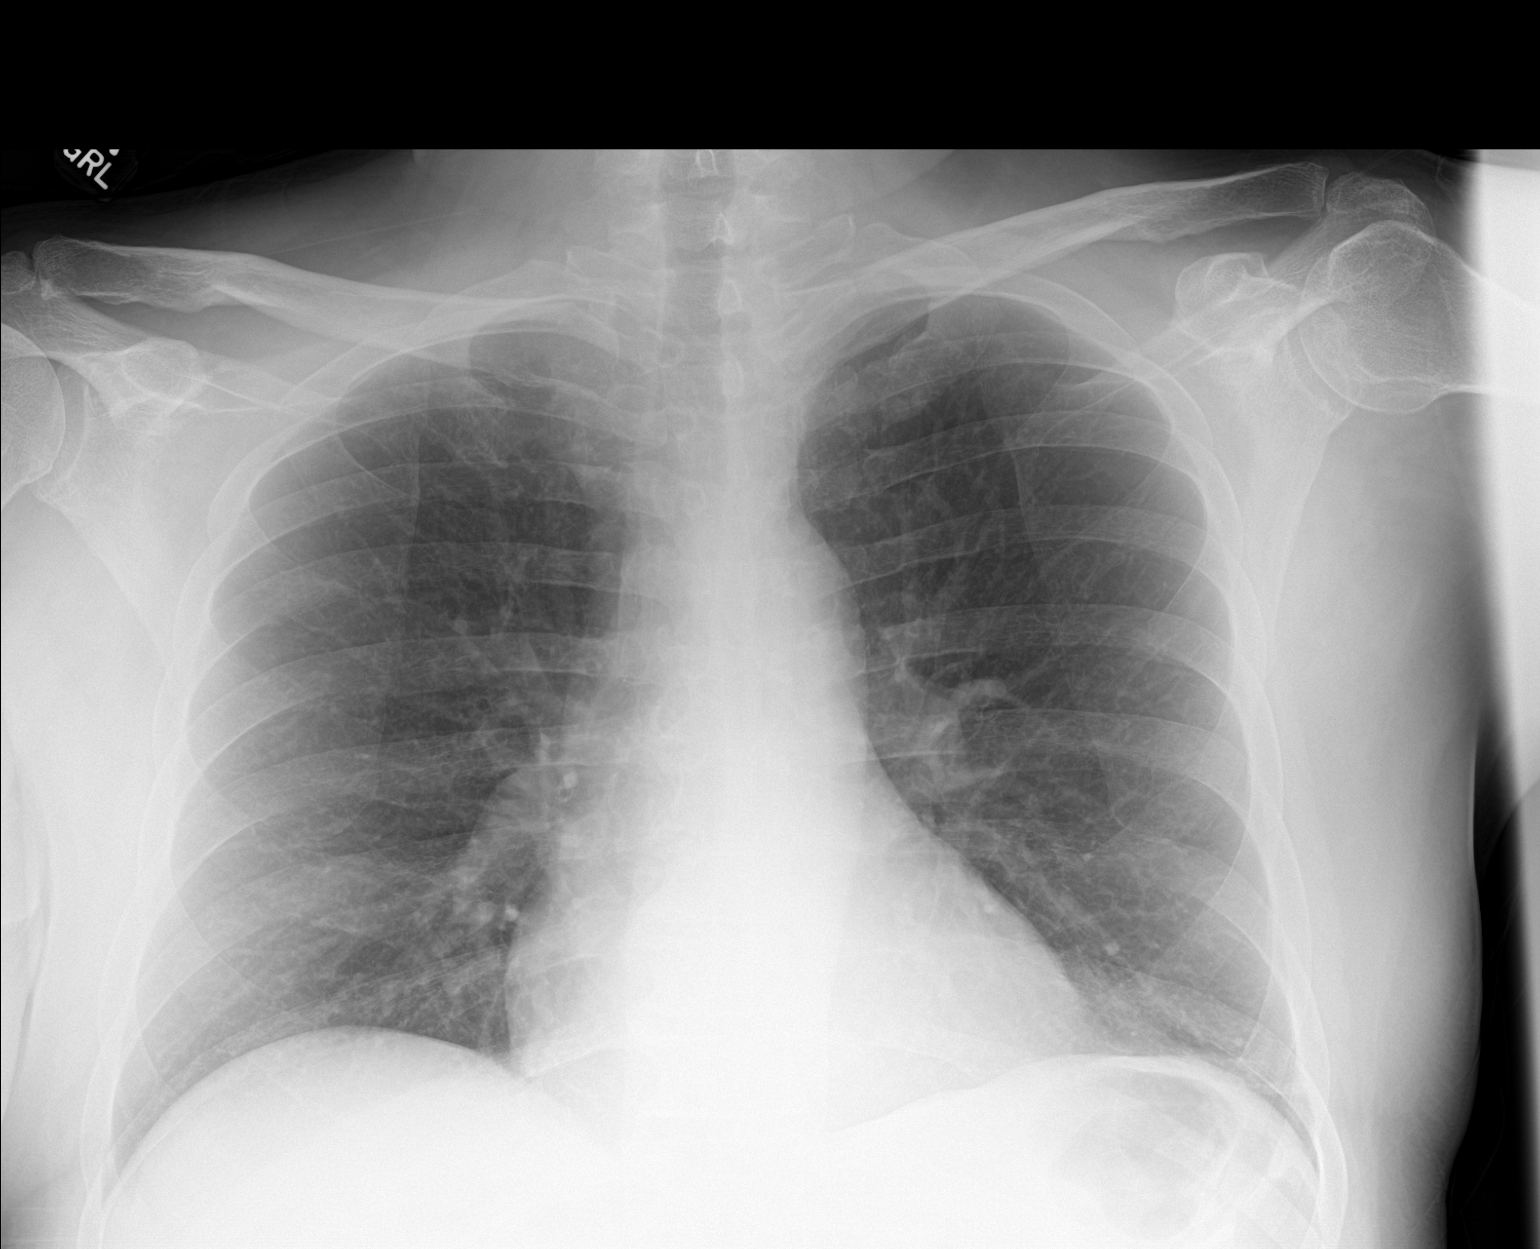

[1 of 1 positions shown; findings below may reference images not displayed]

FINDINGS: The heart size and mediastinal contours are within normal limits.
Both lungs are clear. The visualized skeletal structures are
unremarkable.
IMPRESSION: No active disease.

## 2017-05-27 ENCOUNTER — Telehealth: Payer: Self-pay | Admitting: Pharmacist

## 2017-05-27 NOTE — Telephone Encounter (Signed)
05/27/17 Called [facility] for refills on Colcrys 0.6mg  & Dexilant 60mg . Delos Haring

## 2017-06-08 ENCOUNTER — Emergency Department
Admission: EM | Admit: 2017-06-08 | Discharge: 2017-06-08 | Disposition: A | Discharge status: Home / Self Care | Payer: Self-pay | Attending: Emergency Medicine | Admitting: Emergency Medicine

## 2017-06-08 ENCOUNTER — Encounter: Payer: Self-pay | Admitting: Emergency Medicine

## 2017-06-08 DIAGNOSIS — Y999 Unspecified external cause status: Secondary | ICD-10-CM | POA: Insufficient documentation

## 2017-06-08 DIAGNOSIS — Z5321 Procedure and treatment not carried out due to patient leaving prior to being seen by health care provider: Secondary | ICD-10-CM | POA: Insufficient documentation

## 2017-06-08 DIAGNOSIS — Y9289 Other specified places as the place of occurrence of the external cause: Secondary | ICD-10-CM | POA: Insufficient documentation

## 2017-06-08 DIAGNOSIS — W050XXA Fall from non-moving wheelchair, initial encounter: Secondary | ICD-10-CM | POA: Insufficient documentation

## 2017-06-08 DIAGNOSIS — Y939 Activity, unspecified: Secondary | ICD-10-CM | POA: Insufficient documentation

## 2017-06-08 DIAGNOSIS — S8991XA Unspecified injury of right lower leg, initial encounter: Secondary | ICD-10-CM | POA: Insufficient documentation

## 2017-06-08 NOTE — ED Provider Notes (Cosign Needed)
Patient eloped. Patient was placed in a room in the emergency department. Prior to provider assessing patient, patient disappeared without informing staff.   Darletta Moll, PA-C 06/08/17 2128

## 2017-06-08 NOTE — ED Notes (Signed)
Pt not in room or lobby

## 2017-06-08 NOTE — ED Triage Notes (Signed)
Patient ambulatory to triage with steady gait, without difficulty or distress noted; pt reports lost balance, falling off ramp few hrs PTA: lac noted to shin with no active bleeding

## 2017-06-08 NOTE — ED Notes (Signed)
Pt has laceration to right lower leg.  Pt has bruise to both knees, pt has bruise to right ankle.  Pt has neck pain.  Pt states he fell over a handicap ramp at his home today.  Admits to drinking a bottle of wine today.  No loc.  No back pain.  No headache.  Pt alert.

## 2017-06-29 ENCOUNTER — Telehealth: Payer: Self-pay | Admitting: Pharmacist

## 2017-06-29 NOTE — Telephone Encounter (Signed)
06/29/17 Placed refill online with [facility] for Ventolin HFA 90 mcg Inhale 2 puffs every 4 hours as needed & Advair 100/52mcg Inhale 1 puff two times a day rinse mouth & spit after each use. To release 07/26/17, order# [id number].Miguel [name]

## 2017-08-09 ENCOUNTER — Emergency Department
Admission: EM | Admit: 2017-08-09 | Discharge: 2017-08-09 | Disposition: A | Discharge status: Home / Self Care | Payer: Self-pay | Attending: Emergency Medicine | Admitting: Emergency Medicine

## 2017-08-09 ENCOUNTER — Emergency Department: Payer: Self-pay

## 2017-08-09 DIAGNOSIS — G8929 Other chronic pain: Secondary | ICD-10-CM | POA: Insufficient documentation

## 2017-08-09 DIAGNOSIS — I10 Essential (primary) hypertension: Secondary | ICD-10-CM | POA: Insufficient documentation

## 2017-08-09 DIAGNOSIS — J45909 Unspecified asthma, uncomplicated: Secondary | ICD-10-CM | POA: Insufficient documentation

## 2017-08-09 DIAGNOSIS — F10930 Alcohol use, unspecified with withdrawal, uncomplicated: Secondary | ICD-10-CM

## 2017-08-09 DIAGNOSIS — F10230 Alcohol dependence with withdrawal, uncomplicated: Secondary | ICD-10-CM | POA: Insufficient documentation

## 2017-08-09 DIAGNOSIS — Z87891 Personal history of nicotine dependence: Secondary | ICD-10-CM | POA: Insufficient documentation

## 2017-08-09 DIAGNOSIS — Z79899 Other long term (current) drug therapy: Secondary | ICD-10-CM | POA: Insufficient documentation

## 2017-08-09 LAB — CBC
HCT: 44.7 % (ref 40.0–52.0)
Hemoglobin: 15.1 g/dL (ref 13.0–18.0)
MCH: 31.4 pg (ref 26.0–34.0)
MCHC: 33.8 g/dL (ref 32.0–36.0)
MCV: 92.9 fL (ref 80.0–100.0)
Platelets: 281 10*3/uL (ref 150–440)
RBC: 4.81 MIL/uL (ref 4.40–5.90)
RDW: 14.5 % (ref 11.5–14.5)
WBC: 5.5 10*3/uL (ref 3.8–10.6)

## 2017-08-09 LAB — BASIC METABOLIC PANEL
Anion gap: 16 — ABNORMAL HIGH (ref 5–15)
BUN: 20 mg/dL (ref 6–20)
CO2: 19 mmol/L — ABNORMAL LOW (ref 22–32)
Calcium: 9 mg/dL (ref 8.9–10.3)
Chloride: 104 mmol/L (ref 101–111)
Creatinine, Ser: 1.02 mg/dL (ref 0.61–1.24)
GFR calc Af Amer: >60 mL/min (ref >60)
GFR calc non Af Amer: >60 mL/min (ref >60)
Glucose, Bld: 107 mg/dL — ABNORMAL HIGH (ref 65–99)
Potassium: 4.4 mmol/L (ref 3.5–5.1)
Sodium: 139 mmol/L (ref 135–145)

## 2017-08-09 LAB — URINALYSIS, COMPLETE (UACMP) WITH MICROSCOPIC
Bacteria, UA: NONE SEEN
Bilirubin Urine: NEGATIVE
Glucose, UA: NEGATIVE mg/dL
Hgb urine dipstick: NEGATIVE
Ketones, ur: 5 mg/dL — AB
Leukocytes, UA: NEGATIVE
Nitrite: NEGATIVE
Protein, ur: 100 mg/dL — AB
Specific Gravity, Urine: 1.024 (ref 1.005–1.030)
pH: 5 (ref 5.0–8.0)

## 2017-08-09 LAB — TROPONIN I: Troponin I: <0.03 ng/mL (ref <0.03)

## 2017-08-09 MED ORDER — FAMOTIDINE 20 MG PO TABS: 40.0000 mg | ORAL_TABLET | Freq: Once | ORAL | Status: DC

## 2017-08-09 MED ORDER — CHLORDIAZEPOXIDE HCL 25 MG PO CAPS: 25.0000 mg | ORAL_CAPSULE | Freq: Three times a day (TID) | ORAL | 0 refills | Status: DC | PRN

## 2017-08-09 MED ORDER — GI COCKTAIL ~~LOC~~
30.0000 mL | Freq: Once | ORAL | Status: AC
  Administered 2017-08-09: 30 mL via ORAL
  Filled 2017-08-09: qty 30

## 2017-08-09 MED ORDER — CHLORDIAZEPOXIDE HCL 25 MG PO CAPS
100.0000 mg | ORAL_CAPSULE | Freq: Once | ORAL | Status: AC
  Administered 2017-08-09: 100 mg via ORAL
  Filled 2017-08-09: qty 4

## 2017-08-09 NOTE — ED Notes (Signed)
Return from scan   ua to lab

## 2017-08-09 NOTE — ED Notes (Signed)
Pt unable to void at this time. 

## 2017-08-09 NOTE — ED Notes (Signed)
meds given.  Pt continues have nausea.

## 2017-08-09 NOTE — Discharge Instructions (Addendum)
Please take a Librium up to 4 times a day as needed to help with the withdrawal symptoms. If you feel that you are unable to detox yourself at home, please go either to the residential treatment services or you can always come back to our hospital and we will help you.  It was a pleasure to take care of you today, and thank you for coming to our emergency department.  If you have any questions or concerns before leaving please ask the nurse to grab me and I'm more than happy to go through your aftercare instructions again.  If you were prescribed any opioid pain medication today such as Norco, Vicodin, Percocet, morphine, hydrocodone, or oxycodone please make sure you do not drive when you are taking this medication as it can alter your ability to drive safely.  If you have any concerns once you are home that you are not improving or are in fact getting worse before you can make it to your follow-up appointment, please do not hesitate to call 911 and come back for further evaluation.  Darel Hong, MD  Results for orders placed or performed during the hospital encounter of 84/69/62  Basic metabolic panel  Result Value Ref Range   Sodium 139 135 - 145 mmol/L   Potassium 4.4 3.5 - 5.1 mmol/L   Chloride 104 101 - 111 mmol/L   CO2 19 (L) 22 - 32 mmol/L   Glucose, Bld 107 (H) 65 - 99 mg/dL   BUN 20 6 - 20 mg/dL   Creatinine, Ser 1.02 0.61 - 1.24 mg/dL   Calcium 9.0 8.9 - 10.3 mg/dL   GFR calc non Af Amer >60 >60 mL/min   GFR calc Af Amer >60 >60 mL/min   Anion gap 16 (H) 5 - 15  CBC  Result Value Ref Range   WBC 5.5 3.8 - 10.6 K/uL   RBC 4.81 4.40 - 5.90 MIL/uL   Hemoglobin 15.1 13.0 - 18.0 g/dL   HCT 44.7 40.0 - 52.0 %   MCV 92.9 80.0 - 100.0 fL   MCH 31.4 26.0 - 34.0 pg   MCHC 33.8 32.0 - 36.0 g/dL   RDW 14.5 11.5 - 14.5 %   Platelets 281 150 - 440 K/uL  Troponin I  Result Value Ref Range   Troponin I <0.03 <0.03 ng/mL  Urinalysis, Complete w Microscopic  Result Value Ref Range     Color, Urine AMBER (A) YELLOW   APPearance CLOUDY (A) CLEAR   Specific Gravity, Urine 1.024 1.005 - 1.030   pH 5.0 5.0 - 8.0   Glucose, UA NEGATIVE NEGATIVE mg/dL   Hgb urine dipstick NEGATIVE NEGATIVE   Bilirubin Urine NEGATIVE NEGATIVE   Ketones, ur 5 (A) NEGATIVE mg/dL   Protein, ur 100 (A) NEGATIVE mg/dL   Nitrite NEGATIVE NEGATIVE   Leukocytes, UA NEGATIVE NEGATIVE   RBC / HPF 0-5 0 - 5 RBC/hpf   WBC, UA 6-30 0 - 5 WBC/hpf   Bacteria, UA NONE SEEN NONE SEEN   Squamous Epithelial / LPF 0-5 (A) NONE SEEN   Mucus PRESENT    Dg Chest 2 View  Result Date: 08/09/2017 CLINICAL DATA:  Left-sided chest pain EXAM: CHEST  2 VIEW COMPARISON:  02/28/2017 FINDINGS: The heart size and mediastinal contours are within normal limits. Both lungs are clear. Degenerative changes of the spine. IMPRESSION: No active cardiopulmonary disease. Electronically Signed   By: Donavan Foil M.D.   On: 08/09/2017 17:13   Ct Renal Stone Study  Result Date: 08/09/2017 CLINICAL DATA:  Initial evaluation for acute left lower abdominal pain, nausea, vomiting, diarrhea. EXAM: CT ABDOMEN AND PELVIS WITHOUT CONTRAST TECHNIQUE: Multidetector CT imaging of the abdomen and pelvis was performed following the standard protocol without IV contrast. COMPARISON:  Prior CT from 11/20/2016. FINDINGS: Lower chest: Visualized lung bases are clear. Hepatobiliary: Liver demonstrates a normal unenhanced appearance. Gallbladder absent. No biliary dilatation. Pancreas: Pancreas within normal limits. Spleen: Spleen within normal limits. Adrenals/Urinary Tract: Adrenal glands are normal. Kidneys equal in size. Bilateral nonobstructive nephrolithiasis. Largest on the right positioned in the upper pole and measures 4 mm. Largest on the left also position within the upper pole and measures 10 mm. No radiopaque stones seen along the course of the renal collecting systems bilaterally. No hydronephrosis or hydroureter. Bladder largely decompressed  without abnormality. No layering stones within the bladder lumen. Stomach/Bowel: Small hiatal hernia. Stomach otherwise unremarkable. No evidence for bowel obstruction. Appendix normal. Sigmoid diverticulosis without evidence for diverticulitis. Ascending transverse, and descending colon are decompressed. Circumferential wall thickening likely related incomplete distension, although a superimposed acute colitis could also be considered. No other acute inflammatory changes about the bowels. Vascular/Lymphatic: Mild aortic atherosclerosis. No aneurysm. No adenopathy. Reproductive: Prostate within normal limits. Other: No free air or fluid. Fat containing ventral hernia noted without associated inflammation. Musculoskeletal: No acute osseus abnormality. No worrisome lytic or blastic osseous lesions. IMPRESSION: 1. Bilateral nonobstructive nephrolithiasis as above. No CT evidence for obstructive uropathy. 2. Diffuse wall thickening about the ascending, transverse, and descending colon, favored to be related to incomplete distension, although an acute colitis could also be considered in the correct clinical setting. 3. Sigmoid diverticulosis without evidence for acute diverticulitis. 4. Small hiatal hernia. Electronically Signed   By: Jeannine Boga M.D.   On: 08/09/2017 19:21

## 2017-08-09 NOTE — ED Provider Notes (Signed)
[facility] Emergency Department Provider Note  ____________________________________________   First MD Initiated Contact with Patient 08/09/17 1819     (approximate)  I have reviewed the triage vital signs and the nursing notes.   HISTORY  Chief Complaint Chest Pain   HPI Miguel [name] is a 56 y.o. male presents to the emergency department with severe left upper quadrant pain that began when he awoke this morning. Pain is been constant ever since. He denies chest pain to me. He does report significant nausea and vomiting. The patient is an alcoholic and normally drinks 1 pint of liquor a day although he has recently dropped down and yesterday only drank 3 light beers. He feels cold and tremulous. He has a remote abdominal surgical history of cholecystectomy as well as a remote history of kidney stones. He denies dysuria or hematuria. He denies back pain or flank pain.   Past Medical History:  Diagnosis Date  . Alcohol abuse   . Asthma   . GERD (gastroesophageal reflux disease)   . Gout   . Hypertension   . OCD (obsessive compulsive disorder)   . Renal colic     Patient Active Problem List   Diagnosis Date Noted  . Suicide and self-inflicted injury by cutting and piercing instrument ([place]) 03/10/2017  . Severe recurrent major 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 ([place]) 03/09/2017  . Substance induced mood disorder ([place]) 08/15/2016  . Involuntary commitment 08/15/2016  . Alcohol use disorder, severe, dependence ([place]) 02/05/2016  . Hypertension 12/05/2015  . Tachycardia 12/05/2015  . Gout 11/13/2015  . Chronic back pain 05/02/2015    Past Surgical History:  Procedure Laterality Date  . CHOLECYSTECTOMY  2012    Prior to Admission medications   Medication Sig Start Date End Date Taking? Authorizing Provider  albuterol (VENTOLIN HFA) 108 (90 Base) MCG/ACT inhaler Inhale 2 puffs into the lungs every 4 (four) hours as needed for wheezing or  shortness of breath. 03/12/17   Pucilowska, Jolanta B, MD  allopurinol (ZYLOPRIM) 100 MG tablet TAKE ONE TABLET BY MOUTH EVERY DAY 03/19/17   Tawni Millers, MD  allopurinol (ZYLOPRIM) 300 MG tablet Take 300 mg by mouth every morning.    [provider]  chlordiazePOXIDE (LIBRIUM) 25 MG capsule Take 1 capsule (25 mg total) by mouth 3 (three) times daily as needed for anxiety or withdrawal. 08/09/17   Darel Hong, MD  dexlansoprazole (DEXILANT) 60 MG capsule Take 60 mg by mouth every morning.    [provider]  FLUoxetine (PROZAC) 20 MG capsule Take 1 capsule (20 mg total) by mouth daily. 03/12/17   Pucilowska, Jolanta B, MD  Fluticasone-Salmeterol (ADVAIR) 100-50 MCG/DOSE AEPB Inhale 1 puff into the lungs 2 (two) times daily.    [provider]  lisinopril (PRINIVIL,ZESTRIL) 20 MG tablet Take 1 tablet (20 mg total) by mouth every morning. 03/12/17   Pucilowska, Herma Ard B, MD  omeprazole (PRILOSEC) 20 MG capsule Take 20 mg by mouth daily.    [provider]  risperiDONE (RISPERDAL) 2 MG tablet Take 1 tablet (2 mg total) by mouth 2 (two) times daily. 03/12/17   Pucilowska, Herma Ard B, MD  traZODone (DESYREL) 50 MG tablet Take 1 tablet (50 mg total) by mouth at bedtime as needed for sleep. 03/12/17   Pucilowska, Wardell Honour, MD    Allergies Patient has no known allergies.  No family history on file.  Social History Social History  Substance Use Topics  . Smoking status: Former Research scientist (life sciences)  .  Smokeless tobacco: Never Used  . Alcohol use No     Comment: HX Alcohol Abuse. Last drink 03/15/17    Review of Systems Constitutional: No fever/chills Eyes: No visual changes. ENT: No sore throat. Cardiovascular: Denies chest pain. Respiratory: Denies shortness of breath. Gastrointestinal: positive abdominal pain.  Positive nausea, positive vomiting.  No diarrhea.  No constipation. Genitourinary: Negative for dysuria. Musculoskeletal: Negative for back pain. Skin:  Negative for rash. Neurological: Negative for headaches, focal weakness or numbness.   ____________________________________________   PHYSICAL EXAM:  VITAL SIGNS: ED Triage Vitals  Enc Vitals Group     BP 08/09/17 1620 (!) 142/93     Pulse Rate 08/09/17 1618 (!) 123     Resp 08/09/17 1618 20     Temp 08/09/17 1618 98.5 F (36.9 C)     Temp Source 08/09/17 1618 Oral     SpO2 08/09/17 1618 100 %     Weight 08/09/17 1619 180 lb (81.6 kg)     Height 08/09/17 1619 6' (1.829 m)     Head Circumference --      Peak Flow --      Pain Score 08/09/17 1618 9     Pain Loc --      Pain Edu? --      Excl. in [place]? --     Constitutional: alert and oriented 4 extremely tremulous Eyes: PERRL EOMI. Head: Atraumatic. Nose: No congestion/rhinnorhea. Mouth/Throat: No trismus moderate tongue fasciculations Neck: No stridor.   Cardiovascular: tachycardicrate, regular rhythm. Grossly normal heart sounds.  Good peripheral circulation. Respiratory: Normal respiratory effort.  No retractions. Lungs CTAB and moving good air Gastrointestinal: soft nondistended mild diffuse tenderness Musculoskeletal: No lower extremity edema  mild to moderate hand tremors Neurologic:  Normal speech and language. No gross focal neurologic deficits are appreciated. Skin:  Skin is warm, dry and intact. No rash noted. Psychiatric: Mood and affect are normal. Speech and behavior are normal.    ____________________________________________   DIFFERENTIAL includes but not limited to  Alcohol withdrawal, gastritis, esophagitis, acute coronary syndrome, pulmonary embolism, pneumothorax ____________________________________________   LABS (all labs ordered are listed, but only abnormal results are displayed)  Labs Reviewed  BASIC METABOLIC PANEL - Abnormal; Notable for the following:       Result Value   CO2 19 (*)    Glucose, Bld 107 (*)    Anion gap 16 (*)    All other components within normal limits    URINALYSIS, COMPLETE (UACMP) WITH MICROSCOPIC - Abnormal; Notable for the following:    Color, Urine AMBER (*)    APPearance CLOUDY (*)    Ketones, ur 5 (*)    Protein, ur 100 (*)    Squamous Epithelial / LPF 0-5 (*)    All other components within normal limits  CBC  TROPONIN I    bloodwork reviewed and interpreted by me. Low CO2 likely secondary to increased respiratory rate from alcohol withdrawal __________________________________________  EKG  ED ECG REPORT I, Darel Hong, the attending physician, personally viewed and interpreted this ECG.  Date: 08/09/2017 EKG Time:  Rate: 109 Rhythm: sinus tachycardia QRS Axis: normal Intervals: normal ST/T Wave abnormalities: normal Narrative Interpretation: no evidence of acute ischemia  ____________________________________________  RADIOLOGY  chest x-ray with no acute disease noted ____________________________________________   PROCEDURES  Procedure(s) performed: no  Procedures  Critical Care performed: no  Observation: no ____________________________________________   INITIAL IMPRESSION / ASSESSMENT AND PLAN / ED COURSE  Pertinent labs & imaging results that were  available during my care of the patient were reviewed by me and considered in my medical decision making (see chart for details).  I appreciate the triage note stating chest pain, however to me the patient is reporting left upper quadrant pain and he is clearly withdrawing from alcohol. He has tongue fasciculations and hand tremors. He said he normally drinks hard alcohol over a pint today however his last drink was last night and he only had 3 light beers.    ----------------------------------------- 7:45 PM on 08/09/2017 -----------------------------------------  After 100 mg of Librium the patient's symptoms are essentially gone. I offered to have the patient speak to TTS to see if he were eligible for inpatient rehabilitation, however he  declined stating he needed to go home to care for his ill wife. We discussed titrating down Librium at home to help with his withdrawal symptoms and he verbalizes understanding. He understands he can return to the emergency department for any concerns.  ____________________________________________   FINAL CLINICAL IMPRESSION(S) / ED DIAGNOSES  Final diagnoses:  Alcohol withdrawal syndrome without complication (HCC)      NEW MEDICATIONS STARTED DURING THIS VISIT:  Discharge Medication List as of 08/09/2017  7:44 PM    START taking these medications   Details  chlordiazePOXIDE (LIBRIUM) 25 MG capsule Take 1 capsule (25 mg total) by mouth 3 (three) times daily as needed for anxiety or withdrawal., Starting Mon 08/09/2017, Print         Note:  This document was prepared using Dragon voice recognition software and may include unintentional dictation errors.     Darel Hong, MD 08/09/17 [phone]

## 2017-08-09 NOTE — ED Triage Notes (Signed)
Pt complains of left sided chest pain with nausea starting today, pt is anxious during triage, pt reports vomiting today

## 2017-08-09 NOTE — ED Notes (Signed)
Pt reports he has left side abd pain.  Sx began this morning with vomiting yellow bile.  Pt states he drank 4 bud lights last nite and woke up feeling like hell this morning.  Pt has tremors.  Pt denies chest pain or sob.  No blood pressure meds today.  Sinus tach on monitor.  Pt alert.

## 2017-08-12 ENCOUNTER — Telehealth: Payer: Self-pay | Admitting: Pharmacist

## 2017-08-12 NOTE — Telephone Encounter (Signed)
08/12/17 Called [facility] for refill on Dexilant 60mg .Delos Haring

## 2017-08-16 ENCOUNTER — Emergency Department: Payer: Self-pay

## 2017-08-16 ENCOUNTER — Telehealth: Payer: Self-pay | Admitting: Pharmacist

## 2017-08-16 ENCOUNTER — Encounter: Payer: Self-pay | Admitting: Emergency Medicine

## 2017-08-16 ENCOUNTER — Emergency Department
Admission: EM | Admit: 2017-08-16 | Discharge: 2017-08-16 | Disposition: A | Discharge status: Home / Self Care | Payer: Self-pay | Attending: Emergency Medicine | Admitting: Emergency Medicine

## 2017-08-16 DIAGNOSIS — Z79899 Other long term (current) drug therapy: Secondary | ICD-10-CM | POA: Insufficient documentation

## 2017-08-16 DIAGNOSIS — R51 Headache: Secondary | ICD-10-CM | POA: Insufficient documentation

## 2017-08-16 DIAGNOSIS — Z87891 Personal history of nicotine dependence: Secondary | ICD-10-CM | POA: Insufficient documentation

## 2017-08-16 DIAGNOSIS — W19XXXA Unspecified fall, initial encounter: Secondary | ICD-10-CM

## 2017-08-16 DIAGNOSIS — Y92007 Garden or yard of unspecified non-institutional (private) residence as the place of occurrence of the external cause: Secondary | ICD-10-CM | POA: Insufficient documentation

## 2017-08-16 DIAGNOSIS — I10 Essential (primary) hypertension: Secondary | ICD-10-CM | POA: Insufficient documentation

## 2017-08-16 DIAGNOSIS — Y999 Unspecified external cause status: Secondary | ICD-10-CM | POA: Insufficient documentation

## 2017-08-16 DIAGNOSIS — S0001XA Abrasion of scalp, initial encounter: Secondary | ICD-10-CM | POA: Insufficient documentation

## 2017-08-16 DIAGNOSIS — Y9389 Activity, other specified: Secondary | ICD-10-CM | POA: Insufficient documentation

## 2017-08-16 DIAGNOSIS — W108XXA Fall (on) (from) other stairs and steps, initial encounter: Secondary | ICD-10-CM | POA: Insufficient documentation

## 2017-08-16 DIAGNOSIS — J45909 Unspecified asthma, uncomplicated: Secondary | ICD-10-CM | POA: Insufficient documentation

## 2017-08-16 DIAGNOSIS — Z23 Encounter for immunization: Secondary | ICD-10-CM | POA: Insufficient documentation

## 2017-08-16 MED ORDER — TETANUS-DIPHTH-ACELL PERTUSSIS 5-2.5-18.5 LF-MCG/0.5 IM SUSP
0.5000 mL | Freq: Once | INTRAMUSCULAR | Status: AC
  Administered 2017-08-16: 0.5 mL via INTRAMUSCULAR
  Filled 2017-08-16: qty 0.5

## 2017-08-16 NOTE — Telephone Encounter (Signed)
08/16/17 Placed refill online with [facility] for Ventolin HFA & Advair 100/50 to release 09/28/17, allow 7-10 days from that date to receive. Order# [id number].Delos Haring

## 2017-08-16 NOTE — ED Triage Notes (Signed)
C/O falling down four steps and hitting head.  Denies LOC.  Left side of head with abrasion noted, bleeding controlled.  Dressing applied.  Patient is AAOx3.  Skin warm and dry.  MAE.  Gait unsteady.

## 2017-08-16 NOTE — ED Provider Notes (Signed)
[facility] Emergency Department Provider Note  ____________________________________________   First MD Initiated Contact with Patient 08/16/17 1539     (approximate)  I have reviewed the triage vital signs and the nursing notes.   HISTORY  Chief Complaint Head Injury    HPI Miguel [name] is a 56 y.o. male who comes to the emergency department after a mechanical fall this afternoon. He was going outside to get the mail when she slipped and fell down some stairs and struck the right side of his head. He did not lose consciousness. He takes no blood thinning medication. I'm actually quite familiar with the patient is I treated him for alcohol withdrawal 7 days ago in our emergency department. His withdrawal was minor and I discharged him home with a Librium taper. He is down to taking 25 mg of Librium a day and he took his last dose this morning. His last drink of alcohol was 7 days ago. He said that every time he takes the Librium he feels somewhat lightheaded although is significantly helps the shakes. At this point he denies double vision blurred vision chest pain shortness of breath abdominal pain nausea vomiting or tremors. His last tetanus shot was unknown.   Past Medical History:  Diagnosis Date  . Alcohol abuse   . Asthma   . GERD (gastroesophageal reflux disease)   . Gout   . Hypertension   . OCD (obsessive compulsive disorder)   . Renal colic     Patient Active Problem List   Diagnosis Date Noted  . Suicide and self-inflicted injury by cutting and piercing instrument ([place]) 03/10/2017  . Severe recurrent major 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 ([place]) 03/09/2017  . Substance induced mood disorder ([place]) 08/15/2016  . Involuntary commitment 08/15/2016  . Alcohol use disorder, severe, dependence ([place]) 02/05/2016  . Hypertension 12/05/2015  . Tachycardia 12/05/2015  . Gout 11/13/2015  . Chronic back pain 05/02/2015    Past Surgical History:    Procedure Laterality Date  . CHOLECYSTECTOMY  2012    Prior to Admission medications   Medication Sig Start Date End Date Taking? Authorizing Provider  albuterol (VENTOLIN HFA) 108 (90 Base) MCG/ACT inhaler Inhale 2 puffs into the lungs every 4 (four) hours as needed for wheezing or shortness of breath. 03/12/17   Pucilowska, Jolanta B, MD  allopurinol (ZYLOPRIM) 100 MG tablet TAKE ONE TABLET BY MOUTH EVERY DAY 03/19/17   Tawni Millers, MD  allopurinol (ZYLOPRIM) 300 MG tablet Take 300 mg by mouth every morning.    [provider]  chlordiazePOXIDE (LIBRIUM) 25 MG capsule Take 1 capsule (25 mg total) by mouth 3 (three) times daily as needed for anxiety or withdrawal. 08/09/17   Darel Hong, MD  dexlansoprazole (DEXILANT) 60 MG capsule Take 60 mg by mouth every morning.    [provider]  FLUoxetine (PROZAC) 20 MG capsule Take 1 capsule (20 mg total) by mouth daily. 03/12/17   Pucilowska, Jolanta B, MD  Fluticasone-Salmeterol (ADVAIR) 100-50 MCG/DOSE AEPB Inhale 1 puff into the lungs 2 (two) times daily.    [provider]  lisinopril (PRINIVIL,ZESTRIL) 20 MG tablet Take 1 tablet (20 mg total) by mouth every morning. 03/12/17   Pucilowska, Herma Ard B, MD  omeprazole (PRILOSEC) 20 MG capsule Take 20 mg by mouth daily.    [provider]  risperiDONE (RISPERDAL) 2 MG tablet Take 1 tablet (2 mg total) by mouth 2 (two) times daily. 03/12/17   Clovis Fredrickson, MD  traZODone (DESYREL) 50 MG tablet Take 1 tablet (50 mg total) by mouth at bedtime as needed for sleep. 03/12/17   Pucilowska, Wardell Honour, MD    Allergies Patient has no known allergies.  No family history on file.  Social History Social History  Substance Use Topics  . Smoking status: Former Research scientist (life sciences)  . Smokeless tobacco: Never Used  . Alcohol use No     Comment: HX Alcohol Abuse. Last drink 03/15/17    Review of Systems Constitutional: No fever/chills ENT: No sore throat. Cardiovascular:  Denies chest pain. Respiratory: Denies shortness of breath. Gastrointestinal: No abdominal pain.  No nausea, no vomiting.  No diarrhea.  No constipation. Musculoskeletal: Negative for back pain. Neurological: positive for headache   ____________________________________________   PHYSICAL EXAM:  VITAL SIGNS: ED Triage Vitals  Enc Vitals Group     BP 08/16/17 1339 134/82     Pulse Rate 08/16/17 1339 97     Resp 08/16/17 1339 16     Temp 08/16/17 1339 97.8 F (36.6 C)     Temp Source 08/16/17 1339 Oral     SpO2 08/16/17 1339 100 %     Weight 08/16/17 1329 180 lb (81.6 kg)     Height 08/16/17 1329 6' (1.829 m)     Head Circumference --      Peak Flow --      Pain Score 08/16/17 1329 7     Pain Loc --      Pain Edu? --      Excl. in [place]? --     Constitutional: alert and oriented 4 appropriate cooperative speaks in full clear sentences no diaphoresis Head: multiple superficial abrasions to right occiput with no lacerations requiring repair. Nose: No congestion/rhinnorhea. Mouth/Throat: No trismus no tongue fasciculations Neck: No stridor.  no midline tenderness or step-offs Cardiovascular: regular rate and rhythm Respiratory: Normal respiratory effort.  No retractions. musculoskeletal: no hand tremors Neurologic:  Normal speech and language. No gross focal neurologic deficits are appreciated.  Skin:  Skin is warm, dry and intact. No rash noted.    ____________________________________________  LABS (all labs ordered are listed, but only abnormal results are displayed)  Labs Reviewed - No data to display   __________________________________________  EKG   ____________________________________________  RADIOLOGY  head CT and C-spine CT reviewed by me show no acute disease ____________________________________________   PROCEDURES  Procedure(s) performed: no  Procedures  Critical Care performed: no  Observation:  no ____________________________________________   INITIAL IMPRESSION / ASSESSMENT AND PLAN / ED COURSE  Pertinent labs & imaging results that were available during my care of the patient were reviewed by me and considered in my medical decision making (see chart for details).  The patient arrives to the emergency department neurologically intact after a clear mechanical fall. It may be secondary to some lightheadedness from chlordiazepoxide. Regardless his last drink of alcohol was one week ago and he is currently not withdrawing from alcohol. His CT scans are negative. His last tetanus is unknown so we have updated today. None of his wounds require repair. I've advised him to establish care with a primary care physician within the next week for reevaluation and strict return precautions given. The patient verbalizes understanding and agreement with the plan.      ____________________________________________   FINAL CLINICAL IMPRESSION(S) / ED DIAGNOSES  Final diagnoses:  Abrasion of scalp, initial encounter  Fall, initial encounter      NEW MEDICATIONS STARTED DURING THIS VISIT:  New Prescriptions  No medications on file     Note:  This document was prepared using Dragon voice recognition software and may include unintentional dictation errors.      Darel Hong, MD 08/16/17 [phone]

## 2017-08-16 NOTE — Discharge Instructions (Signed)
Please make sure you get the wounds on her head wet in the shower and keep them clean and dry. It is critically important that you establish care with a primary care physician in the next week for reevaluation. Return to the emergency department sooner for any concerns whatsoever.  It was a pleasure to take care of you today, and thank you for coming to our emergency department.  If you have any questions or concerns before leaving please ask the nurse to grab me and I'm more than happy to go through your aftercare instructions again.  If you were prescribed any opioid pain medication today such as Norco, Vicodin, Percocet, morphine, hydrocodone, or oxycodone please make sure you do not drive when you are taking this medication as it can alter your ability to drive safely.  If you have any concerns once you are home that you are not improving or are in fact getting worse before you can make it to your follow-up appointment, please do not hesitate to call 911 and come back for further evaluation.  Darel Hong, MD  Results for orders placed or performed during the hospital encounter of 76/19/50  Basic metabolic panel  Result Value Ref Range   Sodium 139 135 - 145 mmol/L   Potassium 4.4 3.5 - 5.1 mmol/L   Chloride 104 101 - 111 mmol/L   CO2 19 (L) 22 - 32 mmol/L   Glucose, Bld 107 (H) 65 - 99 mg/dL   BUN 20 6 - 20 mg/dL   Creatinine, Ser 1.02 0.61 - 1.24 mg/dL   Calcium 9.0 8.9 - 10.3 mg/dL   GFR calc non Af Amer >60 >60 mL/min   GFR calc Af Amer >60 >60 mL/min   Anion gap 16 (H) 5 - 15  CBC  Result Value Ref Range   WBC 5.5 3.8 - 10.6 K/uL   RBC 4.81 4.40 - 5.90 MIL/uL   Hemoglobin 15.1 13.0 - 18.0 g/dL   HCT 44.7 40.0 - 52.0 %   MCV 92.9 80.0 - 100.0 fL   MCH 31.4 26.0 - 34.0 pg   MCHC 33.8 32.0 - 36.0 g/dL   RDW 14.5 11.5 - 14.5 %   Platelets 281 150 - 440 K/uL  Troponin I  Result Value Ref Range   Troponin I <0.03 <0.03 ng/mL  Urinalysis, Complete w Microscopic  Result  Value Ref Range   Color, Urine AMBER (A) YELLOW   APPearance CLOUDY (A) CLEAR   Specific Gravity, Urine 1.024 1.005 - 1.030   pH 5.0 5.0 - 8.0   Glucose, UA NEGATIVE NEGATIVE mg/dL   Hgb urine dipstick NEGATIVE NEGATIVE   Bilirubin Urine NEGATIVE NEGATIVE   Ketones, ur 5 (A) NEGATIVE mg/dL   Protein, ur 100 (A) NEGATIVE mg/dL   Nitrite NEGATIVE NEGATIVE   Leukocytes, UA NEGATIVE NEGATIVE   RBC / HPF 0-5 0 - 5 RBC/hpf   WBC, UA 6-30 0 - 5 WBC/hpf   Bacteria, UA NONE SEEN NONE SEEN   Squamous Epithelial / LPF 0-5 (A) NONE SEEN   Mucus PRESENT    Dg Chest 2 View  Result Date: 08/09/2017 CLINICAL DATA:  Left-sided chest pain EXAM: CHEST  2 VIEW COMPARISON:  02/28/2017 FINDINGS: The heart size and mediastinal contours are within normal limits. Both lungs are clear. Degenerative changes of the spine. IMPRESSION: No active cardiopulmonary disease. Electronically Signed   By: Donavan Foil M.D.   On: 08/09/2017 17:13   Ct Head Wo Contrast  Result Date: 08/16/2017  CLINICAL DATA:  Fall, head trauma. EXAM: CT HEAD WITHOUT CONTRAST CT CERVICAL SPINE WITHOUT CONTRAST TECHNIQUE: Multidetector CT imaging of the head and cervical spine was performed following the standard protocol without intravenous contrast. Multiplanar CT image reconstructions of the cervical spine were also generated. COMPARISON:  February 28, 2017. FINDINGS: CT HEAD FINDINGS Brain: No evidence of acute infarction, hemorrhage, hydrocephalus, extra-axial collection or mass lesion/mass effect. Mild age-related cerebral atrophy with compensatory dilatation of the ventricles, similar to prior study. Vascular: Atherosclerotic vascular calcification of the carotid siphons. No hyperdense vessel. Skull: Normal. Negative for fracture or focal lesion. Sinuses/Orbits: Mild mucosal thickening in the left maxillary sinus and bilateral posterior ethmoid air cells. The orbits are unremarkable. Other: None. CT CERVICAL SPINE FINDINGS Alignment: No  traumatic malalignment. Skull base and vertebrae: No acute fracture. No primary bone lesion or focal pathologic process. Soft tissues and spinal canal: No prevertebral fluid or swelling. No visible canal hematoma. Disc levels: Mild disc height loss at C6-C7. Anterior endplate spurring at H3-Z1, C5-C6, and C6-C7. Upper chest: Negative. Other: None. IMPRESSION: 1.  No acute intracranial abnormality. 2.  No acute cervical spine fracture. Electronically Signed   By: Titus Dubin M.D.   On: 08/16/2017 14:15   Ct Cervical Spine Wo Contrast  Result Date: 08/16/2017 CLINICAL DATA:  Fall, head trauma. EXAM: CT HEAD WITHOUT CONTRAST CT CERVICAL SPINE WITHOUT CONTRAST TECHNIQUE: Multidetector CT imaging of the head and cervical spine was performed following the standard protocol without intravenous contrast. Multiplanar CT image reconstructions of the cervical spine were also generated. COMPARISON:  February 28, 2017. FINDINGS: CT HEAD FINDINGS Brain: No evidence of acute infarction, hemorrhage, hydrocephalus, extra-axial collection or mass lesion/mass effect. Mild age-related cerebral atrophy with compensatory dilatation of the ventricles, similar to prior study. Vascular: Atherosclerotic vascular calcification of the carotid siphons. No hyperdense vessel. Skull: Normal. Negative for fracture or focal lesion. Sinuses/Orbits: Mild mucosal thickening in the left maxillary sinus and bilateral posterior ethmoid air cells. The orbits are unremarkable. Other: None. CT CERVICAL SPINE FINDINGS Alignment: No traumatic malalignment. Skull base and vertebrae: No acute fracture. No primary bone lesion or focal pathologic process. Soft tissues and spinal canal: No prevertebral fluid or swelling. No visible canal hematoma. Disc levels: Mild disc height loss at C6-C7. Anterior endplate spurring at I9-C7, C5-C6, and C6-C7. Upper chest: Negative. Other: None. IMPRESSION: 1.  No acute intracranial abnormality. 2.  No acute cervical spine  fracture. Electronically Signed   By: Titus Dubin M.D.   On: 08/16/2017 14:15   Ct Renal Stone Study  Result Date: 08/09/2017 CLINICAL DATA:  Initial evaluation for acute left lower abdominal pain, nausea, vomiting, diarrhea. EXAM: CT ABDOMEN AND PELVIS WITHOUT CONTRAST TECHNIQUE: Multidetector CT imaging of the abdomen and pelvis was performed following the standard protocol without IV contrast. COMPARISON:  Prior CT from 11/20/2016. FINDINGS: Lower chest: Visualized lung bases are clear. Hepatobiliary: Liver demonstrates a normal unenhanced appearance. Gallbladder absent. No biliary dilatation. Pancreas: Pancreas within normal limits. Spleen: Spleen within normal limits. Adrenals/Urinary Tract: Adrenal glands are normal. Kidneys equal in size. Bilateral nonobstructive nephrolithiasis. Largest on the right positioned in the upper pole and measures 4 mm. Largest on the left also position within the upper pole and measures 10 mm. No radiopaque stones seen along the course of the renal collecting systems bilaterally. No hydronephrosis or hydroureter. Bladder largely decompressed without abnormality. No layering stones within the bladder lumen. Stomach/Bowel: Small hiatal hernia. Stomach otherwise unremarkable. No evidence for bowel obstruction. Appendix normal. Sigmoid  diverticulosis without evidence for diverticulitis. Ascending transverse, and descending colon are decompressed. Circumferential wall thickening likely related incomplete distension, although a superimposed acute colitis could also be considered. No other acute inflammatory changes about the bowels. Vascular/Lymphatic: Mild aortic atherosclerosis. No aneurysm. No adenopathy. Reproductive: Prostate within normal limits. Other: No free air or fluid. Fat containing ventral hernia noted without associated inflammation. Musculoskeletal: No acute osseus abnormality. No worrisome lytic or blastic osseous lesions. IMPRESSION: 1. Bilateral nonobstructive  nephrolithiasis as above. No CT evidence for obstructive uropathy. 2. Diffuse wall thickening about the ascending, transverse, and descending colon, favored to be related to incomplete distension, although an acute colitis could also be considered in the correct clinical setting. 3. Sigmoid diverticulosis without evidence for acute diverticulitis. 4. Small hiatal hernia. Electronically Signed   By: Jeannine Boga M.D.   On: 08/09/2017 19:21

## 2017-08-19 IMAGING — CT CT RENAL STONE PROTOCOL
2 of 4 series · 16 of 46 positions shown, 18 images · non-contrast
Comparison: 05/16/2015

CLINICAL DATA: Lightheadedness abdominal pain, onset today.
Right-sided flank pain.

EXAM:
CT ABDOMEN AND PELVIS WITHOUT CONTRAST
TECHNIQUE: Multidetector CT imaging of the abdomen and pelvis was performed
following the standard protocol without IV contrast.

[Series 2: stone full standard · axial · 0.80mm/px · z∈[-1022,-572]mm · 13 of 100 slices shown, 15 images]
[im 5/100  soft-tissue]
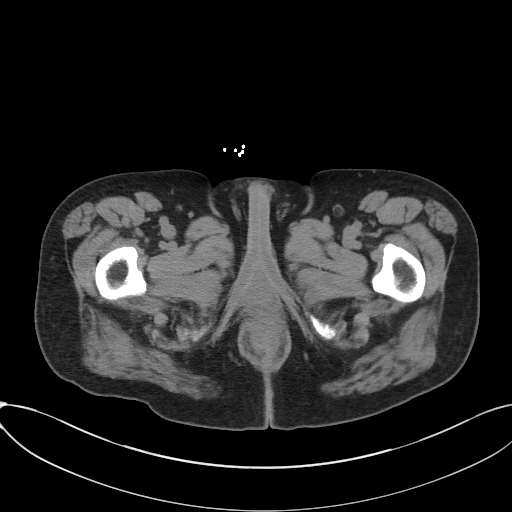
[im 5/100  bone]
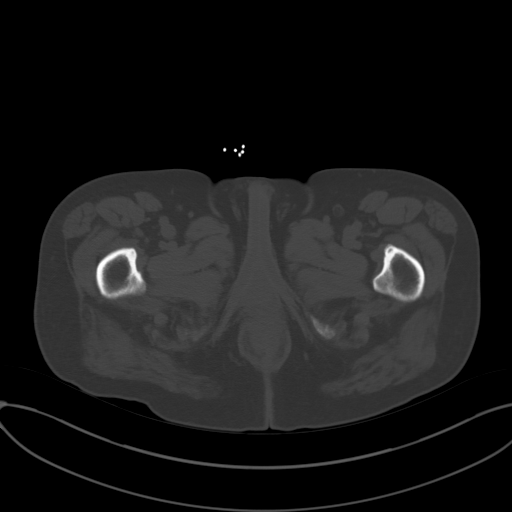
[im 13/100  soft-tissue]
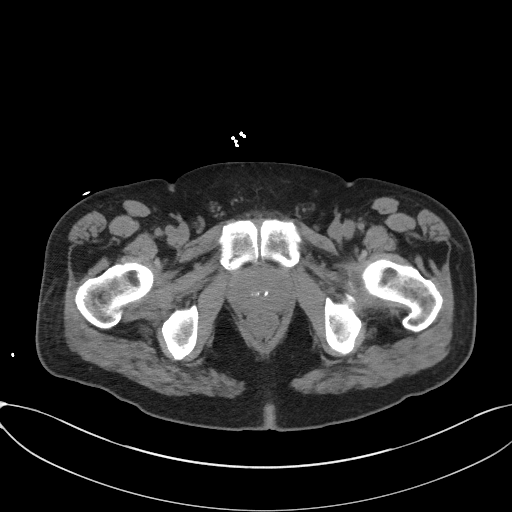
[im 22/100  soft-tissue]
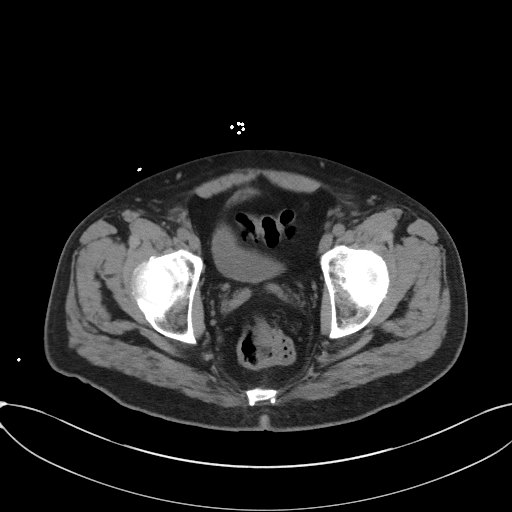
[im 26/100  soft-tissue]
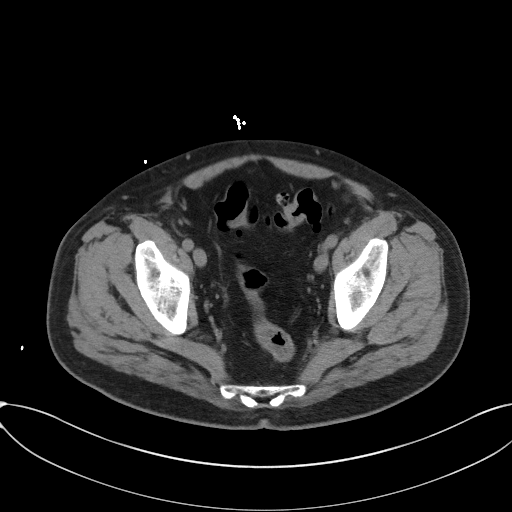
[im 35/100  soft-tissue]
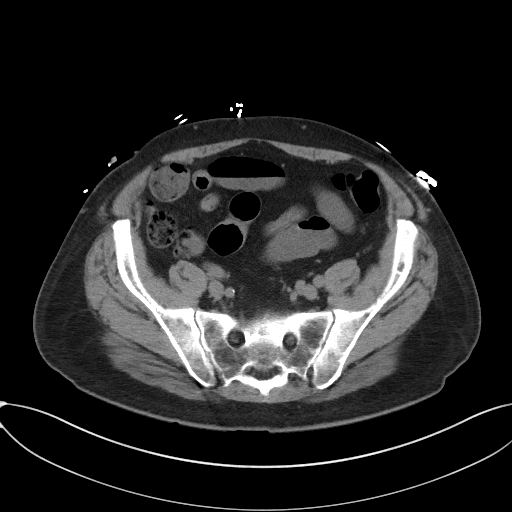
[im 44/100  soft-tissue]
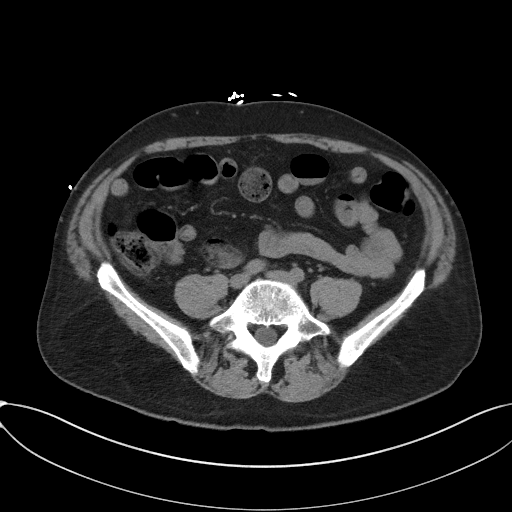
[im 52/100  soft-tissue]
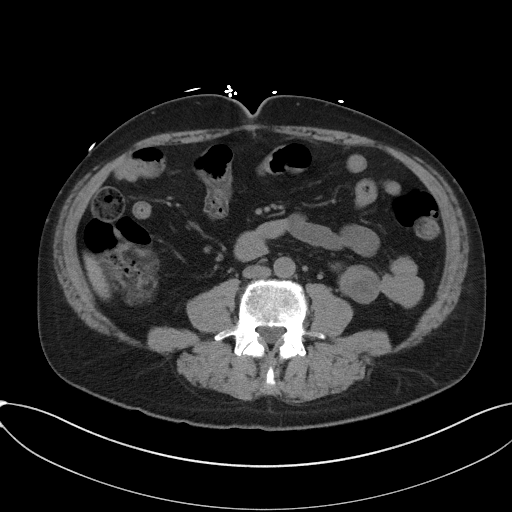
[im 56/100  soft-tissue]
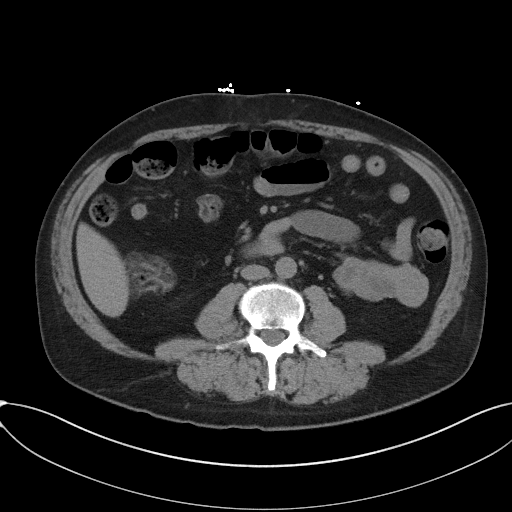
[im 65/100  soft-tissue]
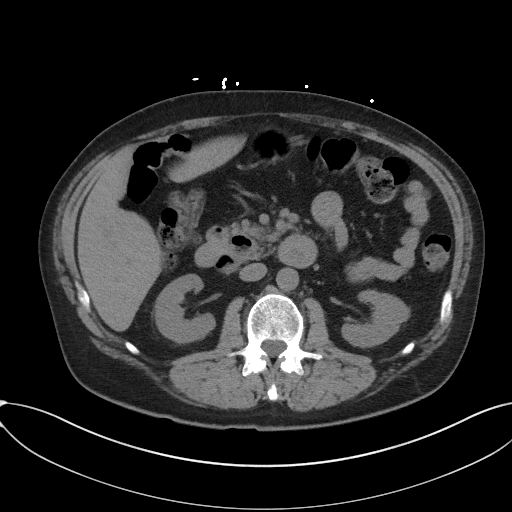
[im 65/100  bone]
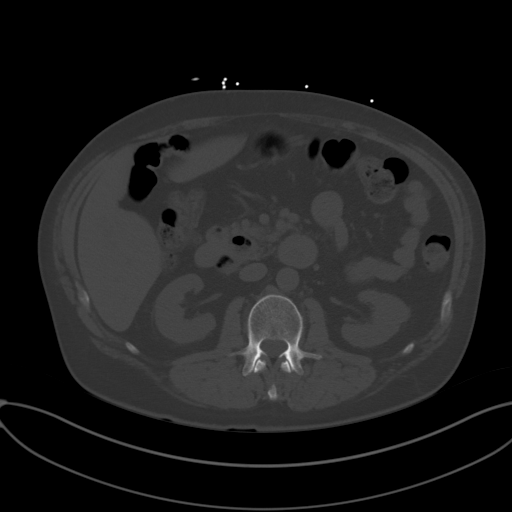
[im 74/100  soft-tissue]
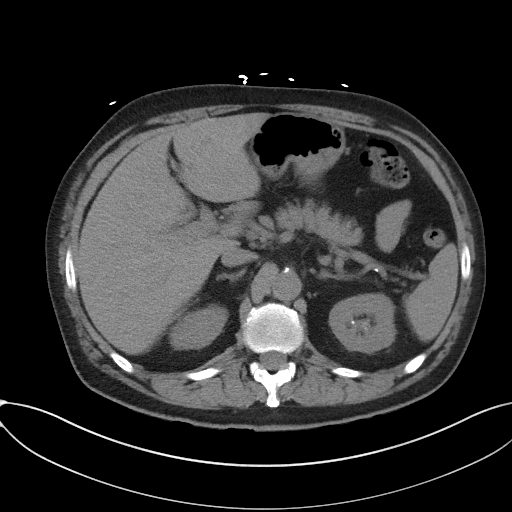
[im 78/100  soft-tissue]
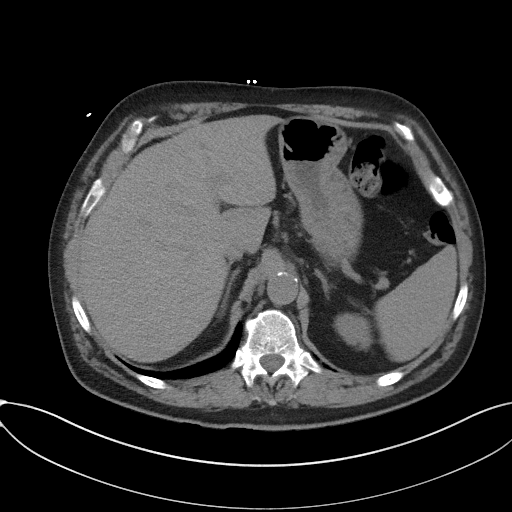
[im 87/100  soft-tissue]
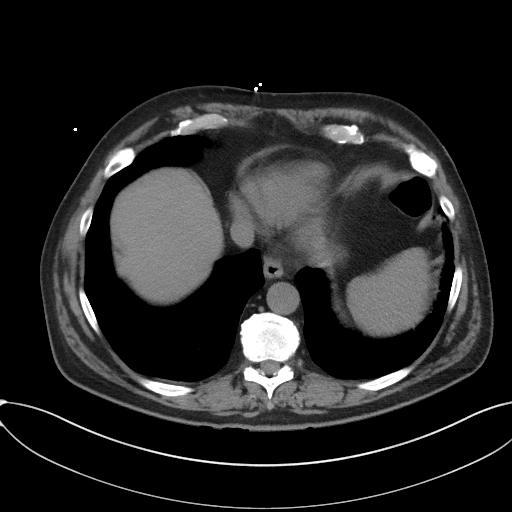
[im 95/100  soft-tissue]
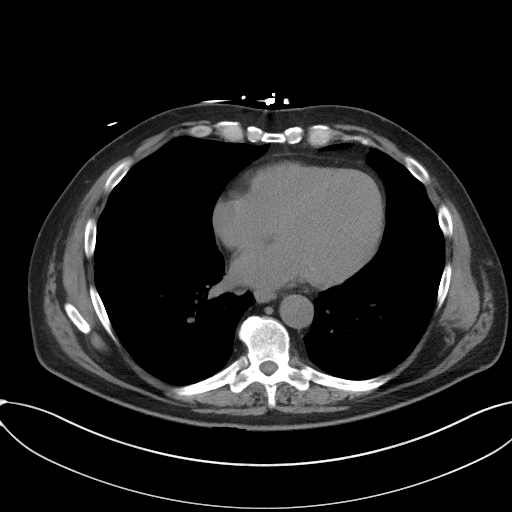

[Series 5: coronal · coronal · 0.76mm/px · 3 of 137 slices shown]
[im 46/137  soft-tissue]
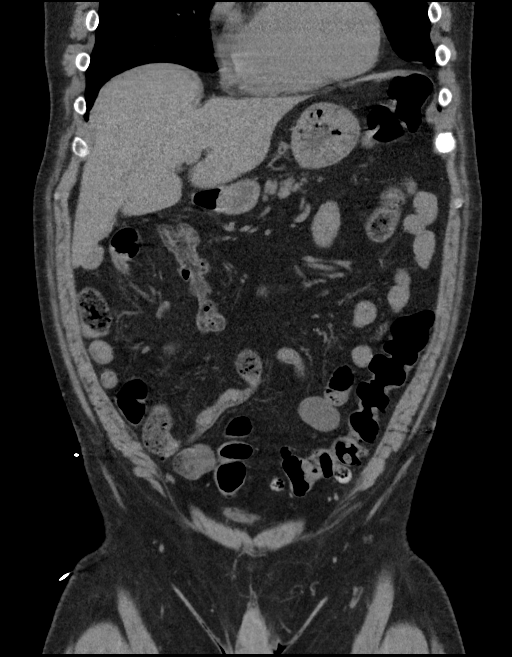
[im 61/137  soft-tissue]
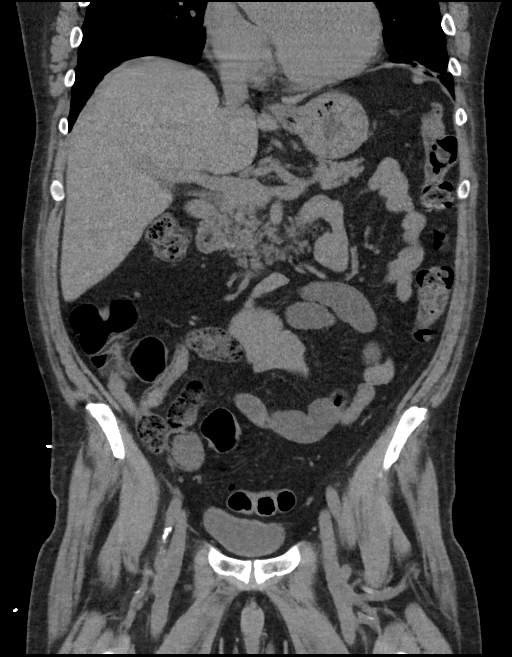
[im 76/137  soft-tissue]
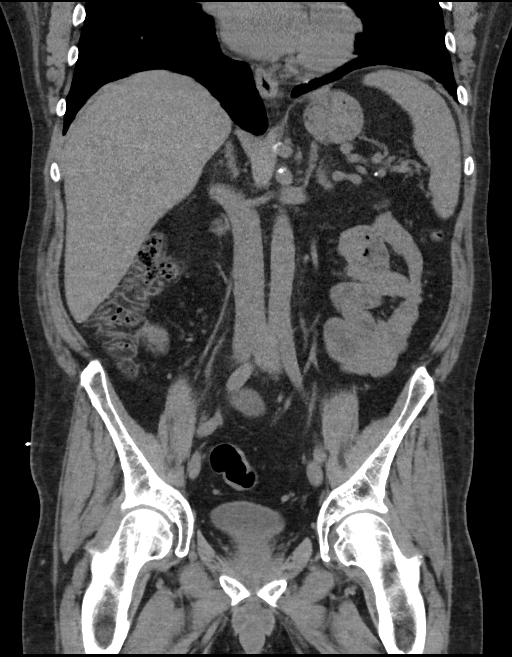

[16 of 46 positions shown; findings below may reference images not displayed]

FINDINGS: Lower chest: Linear scarring in the lingular base, unchanged. No
acute findings.

Hepatobiliary: No focal liver abnormality is seen. Status post
cholecystectomy. No biliary dilatation.

Pancreas: Unremarkable. No pancreatic ductal dilatation or
surrounding inflammatory changes.

Spleen: Normal in size without focal abnormality.

Adrenals/Urinary Tract: Adrenals are normal bilaterally. Multiple
renal collecting system calculi are present bilaterally, measuring
up to 4 mm on the right and up to 10 mm on the left. No
hydronephrosis. No ureteral dilatation. No ureteral calculi.
Unremarkable urinary bladder.

Stomach/Bowel: Stomach is within normal limits. Appendix is normal.
Mild left hemicolon diverticulosis. No evidence of bowel wall
thickening, distention, or inflammatory changes.

Vascular/Lymphatic: The abdominal aorta is normal in caliber with
mild atherosclerotic calcification.

Reproductive: Unremarkable

Other: No acute inflammation.  No ascites.

Musculoskeletal: No significant skeletal lesion.
IMPRESSION: 1. No acute findings are evident in the abdomen or pelvis.
2. Diverticulosis.
3. Bilateral nephrolithiasis.

## 2017-08-20 ENCOUNTER — Emergency Department
Admission: EM | Admit: 2017-08-20 | Discharge: 2017-08-21 | Disposition: A | Discharge status: Home / Self Care | Payer: Self-pay | Attending: Emergency Medicine | Admitting: Emergency Medicine

## 2017-08-20 DIAGNOSIS — F1020 Alcohol dependence, uncomplicated: Secondary | ICD-10-CM | POA: Insufficient documentation

## 2017-08-20 DIAGNOSIS — Y939 Activity, unspecified: Secondary | ICD-10-CM | POA: Insufficient documentation

## 2017-08-20 DIAGNOSIS — Y906 Blood alcohol level of 120-199 mg/100 ml: Secondary | ICD-10-CM | POA: Insufficient documentation

## 2017-08-20 DIAGNOSIS — X58XXXA Exposure to other specified factors, initial encounter: Secondary | ICD-10-CM | POA: Insufficient documentation

## 2017-08-20 DIAGNOSIS — J45909 Unspecified asthma, uncomplicated: Secondary | ICD-10-CM | POA: Insufficient documentation

## 2017-08-20 DIAGNOSIS — Y929 Unspecified place or not applicable: Secondary | ICD-10-CM | POA: Insufficient documentation

## 2017-08-20 DIAGNOSIS — G8929 Other chronic pain: Secondary | ICD-10-CM | POA: Insufficient documentation

## 2017-08-20 DIAGNOSIS — Y999 Unspecified external cause status: Secondary | ICD-10-CM | POA: Insufficient documentation

## 2017-08-20 DIAGNOSIS — Z79899 Other long term (current) drug therapy: Secondary | ICD-10-CM | POA: Insufficient documentation

## 2017-08-20 DIAGNOSIS — Z87891 Personal history of nicotine dependence: Secondary | ICD-10-CM | POA: Insufficient documentation

## 2017-08-20 DIAGNOSIS — S81011A Laceration without foreign body, right knee, initial encounter: Secondary | ICD-10-CM | POA: Insufficient documentation

## 2017-08-20 DIAGNOSIS — I10 Essential (primary) hypertension: Secondary | ICD-10-CM | POA: Insufficient documentation

## 2017-08-20 LAB — COMPREHENSIVE METABOLIC PANEL
ALT: 19 U/L (ref 17–63)
AST: 16 U/L (ref 15–41)
Albumin: 3.4 g/dL — ABNORMAL LOW (ref 3.5–5.0)
Alkaline Phosphatase: 49 U/L (ref 38–126)
Anion gap: 9 (ref 5–15)
BUN: 15 mg/dL (ref 6–20)
CO2: 26 mmol/L (ref 22–32)
Calcium: 8.9 mg/dL (ref 8.9–10.3)
Chloride: 106 mmol/L (ref 101–111)
Creatinine, Ser: 1.37 mg/dL — ABNORMAL HIGH (ref 0.61–1.24)
GFR calc Af Amer: >60 mL/min (ref >60)
GFR calc non Af Amer: 56 mL/min — ABNORMAL LOW (ref >60)
Glucose, Bld: 112 mg/dL — ABNORMAL HIGH (ref 65–99)
Potassium: 3.4 mmol/L — ABNORMAL LOW (ref 3.5–5.1)
Sodium: 141 mmol/L (ref 135–145)
Total Bilirubin: 0.6 mg/dL (ref 0.3–1.2)
Total Protein: 6 g/dL — ABNORMAL LOW (ref 6.5–8.1)

## 2017-08-20 LAB — CBC
HCT: 38.3 % — ABNORMAL LOW (ref 40.0–52.0)
Hemoglobin: 13.3 g/dL (ref 13.0–18.0)
MCH: 32.6 pg (ref 26.0–34.0)
MCHC: 34.7 g/dL (ref 32.0–36.0)
MCV: 93.9 fL (ref 80.0–100.0)
Platelets: 200 10*3/uL (ref 150–440)
RBC: 4.07 MIL/uL — ABNORMAL LOW (ref 4.40–5.90)
RDW: 13.5 % (ref 11.5–14.5)
WBC: 4.6 10*3/uL (ref 3.8–10.6)

## 2017-08-20 LAB — URINALYSIS, COMPLETE (UACMP) WITH MICROSCOPIC
Bilirubin Urine: NEGATIVE
Glucose, UA: NEGATIVE mg/dL
Ketones, ur: NEGATIVE mg/dL
Nitrite: NEGATIVE
Protein, ur: NEGATIVE mg/dL
Specific Gravity, Urine: 1.006 (ref 1.005–1.030)
pH: 6 (ref 5.0–8.0)

## 2017-08-20 LAB — URINE DRUG SCREEN, QUALITATIVE (ARMC ONLY)
Amphetamines, Ur Screen: NOT DETECTED
Barbiturates, Ur Screen: NOT DETECTED
Benzodiazepine, Ur Scrn: POSITIVE — AB
Cannabinoid 50 Ng, Ur ~~LOC~~: POSITIVE — AB
Cocaine Metabolite,Ur ~~LOC~~: NOT DETECTED
MDMA (Ecstasy)Ur Screen: NOT DETECTED
Methadone Scn, Ur: NOT DETECTED
Opiate, Ur Screen: NOT DETECTED
Phencyclidine (PCP) Ur S: NOT DETECTED
Tricyclic, Ur Screen: NOT DETECTED

## 2017-08-20 LAB — ETHANOL: Alcohol, Ethyl (B): 199 mg/dL — ABNORMAL HIGH (ref <10)

## 2017-08-20 LAB — SALICYLATE LEVEL: Salicylate Lvl: <7 mg/dL (ref 2.8–30.0)

## 2017-08-20 LAB — ACETAMINOPHEN LEVEL: Acetaminophen (Tylenol), Serum: <10 ug/mL — ABNORMAL LOW (ref 10–30)

## 2017-08-20 MED ORDER — LIDOCAINE HCL (PF) 1 % IJ SOLN
INTRAMUSCULAR | Status: AC
  Filled 2017-08-20: qty 5

## 2017-08-20 NOTE — ED Notes (Signed)
Called police department to try to arrange for social work to check on patient's wife as she is wheelchair bound. Was informed an officer would return call.

## 2017-08-20 NOTE — ED Provider Notes (Signed)
[facility] Emergency Department Provider Note  ____________________________________________   First MD Initiated Contact with Patient 08/20/17 1928     (approximate)  I have reviewed the triage vital signs and the nursing notes.   HISTORY  Chief Complaint Laceration (right knee) and Mental Health Problem   HPI Miguel [name] is a 56 y.o. male with a history of alcohol abuse as well as hypertension who is presenting to the emergency department today after sustaining lacerations to his right knee. He says that he accidentally closed his right knee and his car door and then fell backwards. He denies hitting his head or lose consciousness. Sustained 2 lacerations to his right knee and says that he had a tetanus shot just several days ago. Patient presented with IVC paperwork because of concern for knife on him and that he inflicted harm on himself. Specifically that he may have cut himself on his knee and step sustaining the injury is accidentally as stated by the patient.   Past Medical History:  Diagnosis Date  . Alcohol abuse   . Asthma   . GERD (gastroesophageal reflux disease)   . Gout   . Hypertension   . OCD (obsessive compulsive disorder)   . Renal colic     Patient Active Problem List   Diagnosis Date Noted  . Suicide and self-inflicted injury by cutting and piercing instrument ([place]) 03/10/2017  . Severe recurrent major 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 ([place]) 03/09/2017  . Substance induced mood disorder ([place]) 08/15/2016  . Involuntary commitment 08/15/2016  . Alcohol use disorder, severe, dependence ([place]) 02/05/2016  . Hypertension 12/05/2015  . Tachycardia 12/05/2015  . Gout 11/13/2015  . Chronic back pain 05/02/2015    Past Surgical History:  Procedure Laterality Date  . CHOLECYSTECTOMY  2012    Prior to Admission medications   Medication Sig Start Date End Date Taking? Authorizing Provider  albuterol (VENTOLIN HFA) 108 (90 Base)  MCG/ACT inhaler Inhale 2 puffs into the lungs every 4 (four) hours as needed for wheezing or shortness of breath. 03/12/17   Pucilowska, Jolanta B, MD  allopurinol (ZYLOPRIM) 100 MG tablet TAKE ONE TABLET BY MOUTH EVERY DAY 03/19/17   Tawni Millers, MD  allopurinol (ZYLOPRIM) 300 MG tablet Take 300 mg by mouth every morning.    [provider]  chlordiazePOXIDE (LIBRIUM) 25 MG capsule Take 1 capsule (25 mg total) by mouth 3 (three) times daily as needed for anxiety or withdrawal. 08/09/17   Darel Hong, MD  dexlansoprazole (DEXILANT) 60 MG capsule Take 60 mg by mouth every morning.    [provider]  FLUoxetine (PROZAC) 20 MG capsule Take 1 capsule (20 mg total) by mouth daily. 03/12/17   Pucilowska, Jolanta B, MD  Fluticasone-Salmeterol (ADVAIR) 100-50 MCG/DOSE AEPB Inhale 1 puff into the lungs 2 (two) times daily.    [provider]  lisinopril (PRINIVIL,ZESTRIL) 20 MG tablet Take 1 tablet (20 mg total) by mouth every morning. 03/12/17   Pucilowska, Herma Ard B, MD  omeprazole (PRILOSEC) 20 MG capsule Take 20 mg by mouth daily.    [provider]  risperiDONE (RISPERDAL) 2 MG tablet Take 1 tablet (2 mg total) by mouth 2 (two) times daily. 03/12/17   Pucilowska, Herma Ard B, MD  traZODone (DESYREL) 50 MG tablet Take 1 tablet (50 mg total) by mouth at bedtime as needed for sleep. 03/12/17   Pucilowska, Wardell Honour, MD    Allergies Patient has no known allergies.  No family history on file.  Social History Social History  Substance Use Topics  . Smoking status: Former Research scientist (life sciences)  . Smokeless tobacco: Never Used  . Alcohol use No     Comment: HX Alcohol Abuse. Last drink 03/15/17    Review of Systems  Constitutional: No fever/chills Eyes: No visual changes. ENT: No sore throat. Cardiovascular: Denies chest pain. Respiratory: Denies shortness of breath. Gastrointestinal: No abdominal pain.  No nausea, no vomiting.  No diarrhea.  No constipation. Genitourinary:  Negative for dysuria. Musculoskeletal: Negative for back pain. Skin: Negative for rash. Neurological: Negative for headaches, focal weakness or numbness.   ____________________________________________   PHYSICAL EXAM:  VITAL SIGNS: ED Triage Vitals  Enc Vitals Group     BP 08/20/17 1937 97/67     Pulse Rate 08/20/17 1937 89     Resp 08/20/17 1937 15     Temp 08/20/17 1937 98.3 F (36.8 C)     Temp Source 08/20/17 1937 Oral     SpO2 08/20/17 1932 97 %     Weight 08/20/17 1937 180 lb (81.6 kg)     Height 08/20/17 1937 6' (1.829 m)     Head Circumference --      Peak Flow --      Pain Score 08/20/17 1937 9     Pain Loc --      Pain Edu? --      Excl. in [place]? --     Constitutional: Alert and oriented. Well appearing and in no acute distress. Eyes: Conjunctivae are normal.  Head: Atraumatic. Nose: No congestion/rhinnorhea. Mouth/Throat: Mucous membranes are moist.  Neck: No stridor.   Cardiovascular: Normal rate, regular rhythm. Grossly normal heart sounds.   Respiratory: Normal respiratory effort.  No retractions. Lungs CTAB. Gastrointestinal: Soft and nontender. No distention. No CVA tenderness. Musculoskeletal: No lower extremity tenderness nor edema.  No joint effusions. full range of motion to the bilateral lower extremities with 5 out of 5 strength.no tenderness or swelling. Neurologic:  Normal speech and language. No gross focal neurologic deficits are appreciated. Skin:  20, 2 similar lacerations to the knee which are oblique. They are on the right knee and overlying the skin of the patella. There just through deep to the dermis. No active bleeding. No exposed adipose. Psychiatric: Mood and affect are normal. Speech and behavior are normal.  ____________________________________________   LABS (all labs ordered are listed, but only abnormal results are displayed)  Labs Reviewed  COMPREHENSIVE METABOLIC PANEL - Abnormal; Notable for the following:       Result  Value   Potassium 3.4 (*)    Glucose, Bld 112 (*)    Creatinine, Ser 1.37 (*)    Total Protein 6.0 (*)    Albumin 3.4 (*)    GFR calc non Af Amer 56 (*)    All other components within normal limits  ETHANOL - Abnormal; Notable for the following:    Alcohol, Ethyl (B) 199 (*)    All other components within normal limits  CBC - Abnormal; Notable for the following:    RBC 4.07 (*)    HCT 38.3 (*)    All other components within normal limits  URINE DRUG SCREEN, QUALITATIVE ([facility] ONLY) - Abnormal; Notable for the following:    Cannabinoid 50 Ng, Ur [place] POSITIVE (*)    Benzodiazepine, Ur Scrn POSITIVE (*)    All other components within normal limits  URINALYSIS, COMPLETE (UACMP) WITH MICROSCOPIC - Abnormal; Notable for the following:    Color, Urine YELLOW (*)  APPearance CLOUDY (*)    Hgb urine dipstick MODERATE (*)    Leukocytes, UA MODERATE (*)    Bacteria, UA RARE (*)    Squamous Epithelial / LPF 6-30 (*)    All other components within normal limits  ACETAMINOPHEN LEVEL - Abnormal; Notable for the following:    Acetaminophen (Tylenol), Serum <10 (*)    All other components within normal limits  SALICYLATE LEVEL   ____________________________________________  EKG   ____________________________________________  RADIOLOGY   ____________________________________________   PROCEDURES  Procedure(s) performed:   LACERATION REPAIR Performed by: Doran Stabler Authorized by: Doran Stabler Consent: Verbal consent obtained. Risks and benefits: risks, benefits and alternatives were discussed Consent given by: patient Patient identity confirmed: provided demographic data Prepped and Draped in normal sterile fashion Wound explored  Laceration Location: right knee  Laceration Length: 2 lacs x2cm  No Foreign Bodies seen or palpated  Anesthesia: local infiltration  Local anesthetic: lidocaine 1% without epinephrine  Anesthetic total: 4 ml  Irrigation  method: syringe Amount of cleaning: standard  Skin closure: 4-0 nylon  Number of sutures: 3  Technique: simple interrupted  Patient tolerance: Patient tolerated the procedure well with no immediate complications.  Wounds explored and does not appear to violate the joint space.  Procedures  Critical Care performed:   ____________________________________________   INITIAL IMPRESSION / ASSESSMENT AND PLAN / ED COURSE  Pertinent labs & imaging results that were available during my care of the patient were reviewed by me and considered in my medical decision making (see chart for details).   DDX: Self-mutilation, laceration, intoxication, mechanical fall   ----------------------------------------- 11:57 PM on 08/20/2017 -----------------------------------------  Patient pending psychiatric evaluation at this time. Sutured. Signed out to Dr. Kerman Passey.  ____________________________________________   FINAL CLINICAL IMPRESSION(S) / ED DIAGNOSES   Laceration. alcohol intoxication.    NEW MEDICATIONS STARTED DURING THIS VISIT:  New Prescriptions   No medications on file     Note:  This document was prepared using Dragon voice recognition software and may include unintentional dictation errors.     Orbie Pyo, MD 08/20/17 [phone]

## 2017-08-20 NOTE — ED Triage Notes (Signed)
Patient c/o laceration to right knee.  Per EMS: laceration is self inflicted. Patient found outside standing at EMS arrival. Blood scattered across floor in patient's home from where patient had been walking through house. Patient denied self injury, however police officers found knife that patient used to inflict injury.  This RN asked patient about injury and patient reported that he shut his leg in a car door. RN explained that the police officers reported that they found the knife that inflicted the injury. Patient then stated: "Did they go in my pocket? They had to have gone in pocket to get the knife". RN again asked patient if the wound was self inflicted. Patient evaded question. Patient reported he was sad, and that his wife keeps asking why he's mean to her.   Patient denies suicidal ideation. Patient is tearful.  Patient states: "please Zell't send me downstairs. There's no one to take care of my wife."

## 2017-08-21 IMAGING — CT CT ABD-PELV W/ CM
2 of 5 series · 15 of 46 positions shown, 17 images · IV contrast (APPLIED)
Comparison: Comparison made with recent CT from 11/19/2015.

CLINICAL DATA: Initial evaluation for acute right-sided abdominal
pain.

EXAM:
CT ABDOMEN AND PELVIS WITH CONTRAST
TECHNIQUE: Multidetector CT imaging of the abdomen and pelvis was performed
using the standard protocol following bolus administration of
intravenous contrast.
CONTRAST:  100mL Q1KCK1-G00 IOPAMIDOL (Q1KCK1-G00) INJECTION 61%

[Series 2: routine abd/pel with · axial · 0.82mm/px · z∈[-1052,-617]mm · 12 of 97 slices shown, 14 images]
[im 5/97  soft-tissue]
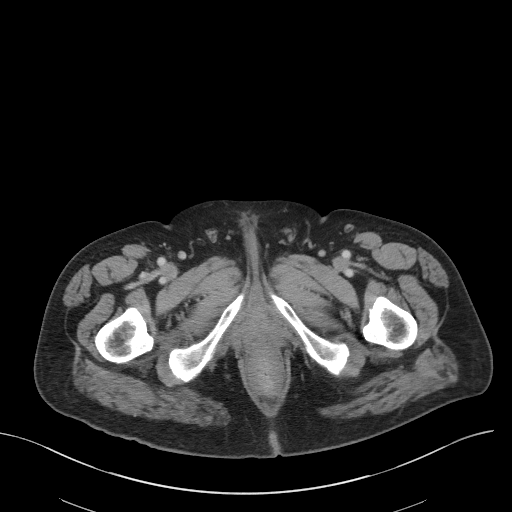
[im 5/97  bone]
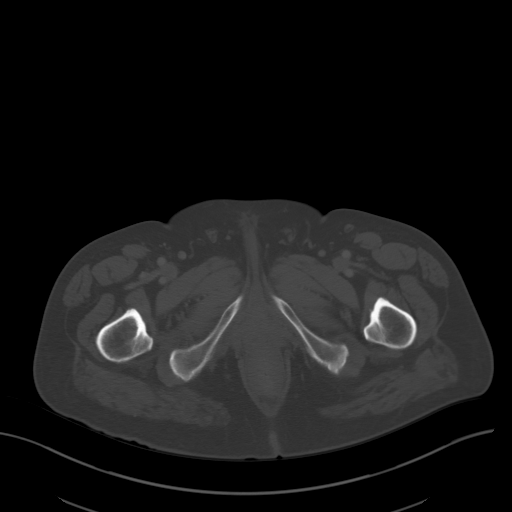
[im 15/97  soft-tissue]
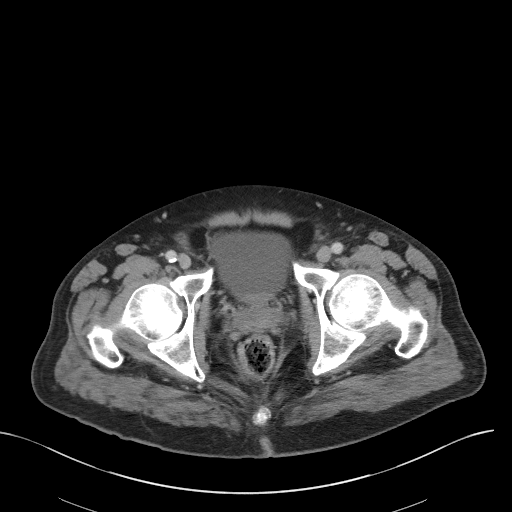
[im 20/97  soft-tissue]
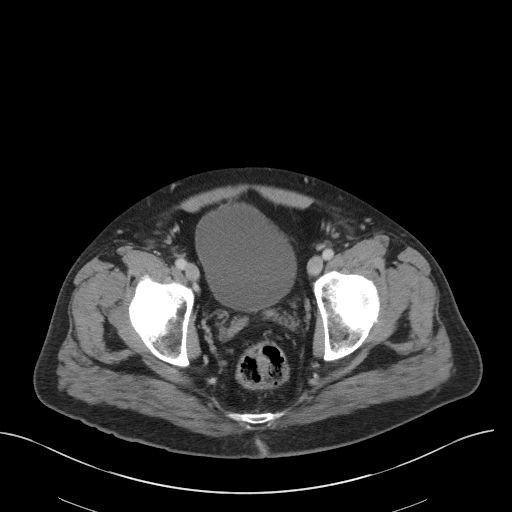
[im 29/97  soft-tissue]
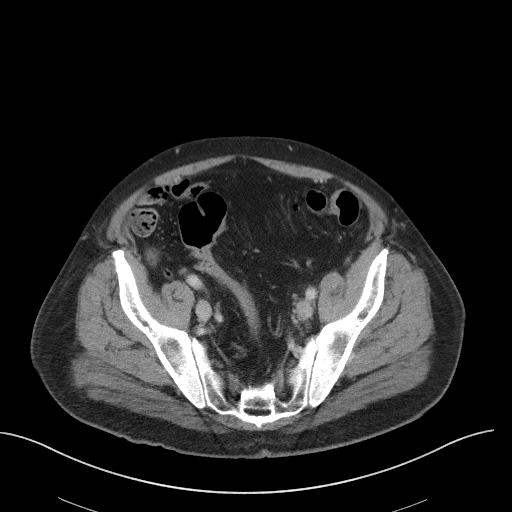
[im 39/97  soft-tissue]
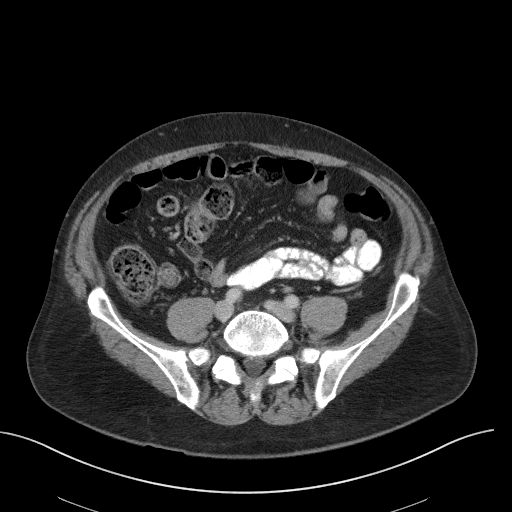
[im 44/97  soft-tissue]
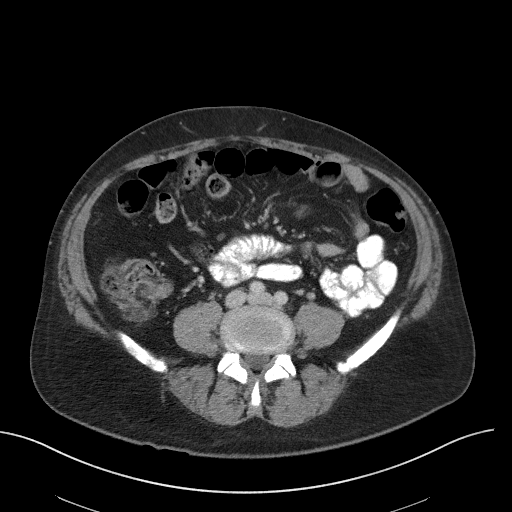
[im 53/97  soft-tissue]
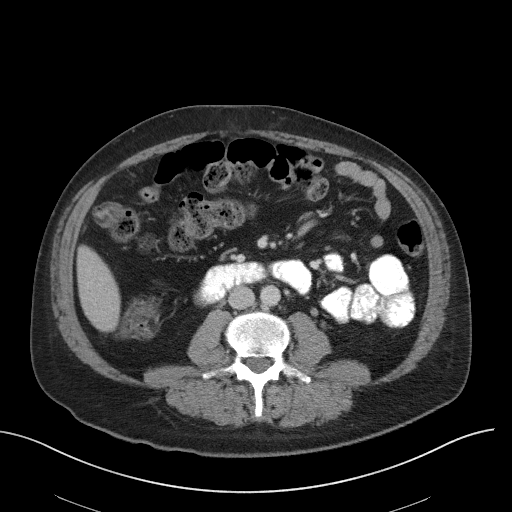
[im 58/97  soft-tissue]
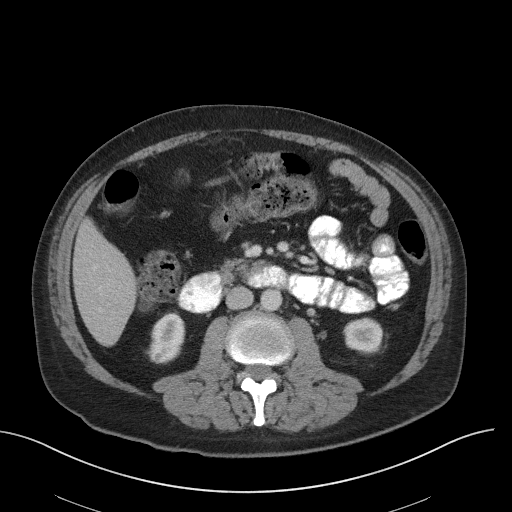
[im 68/97  soft-tissue]
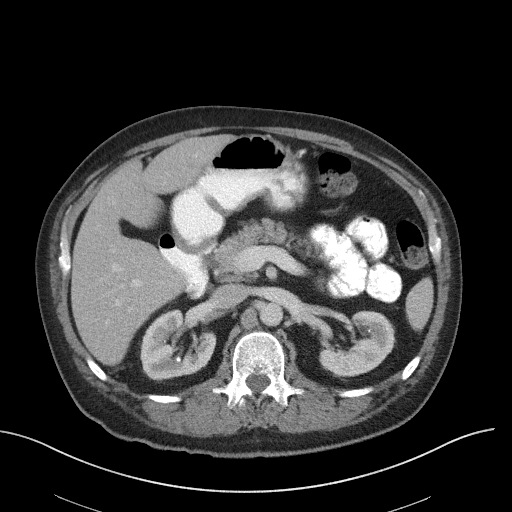
[im 68/97  bone]
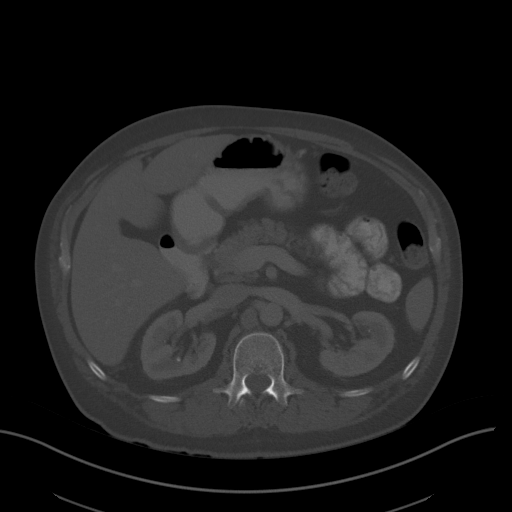
[im 77/97  soft-tissue]
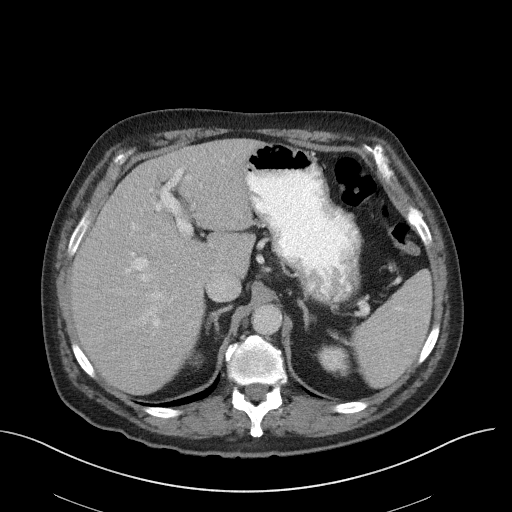
[im 82/97  soft-tissue]
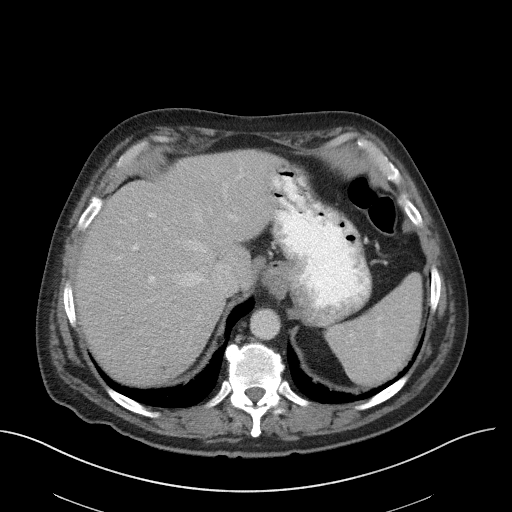
[im 92/97  soft-tissue]
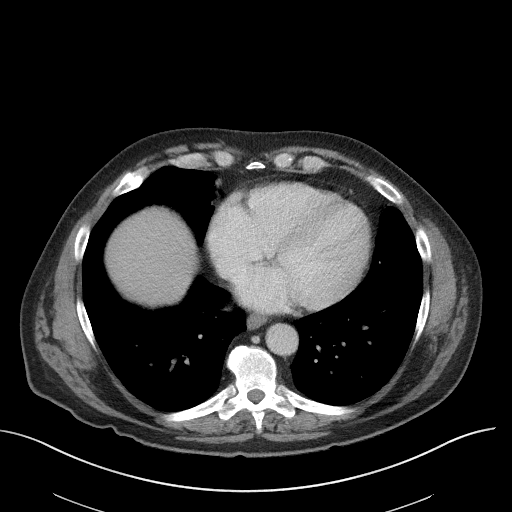

[Series 5: coronal st · coronal · 0.71mm/px · 3 of 92 slices shown]
[im 31/92  soft-tissue]
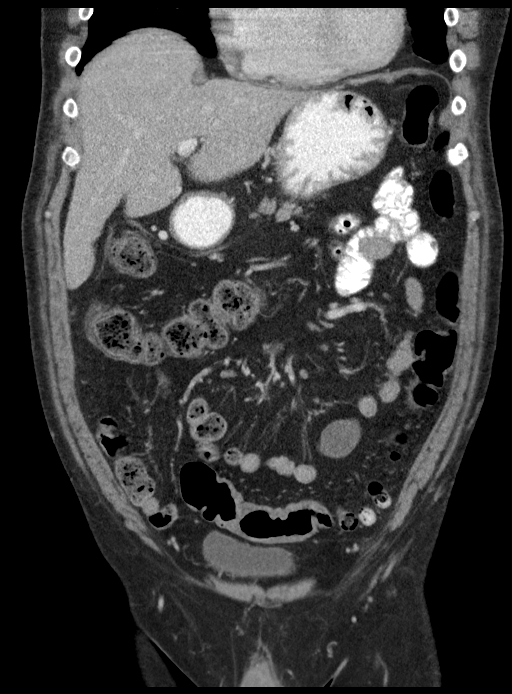
[im 41/92  soft-tissue]
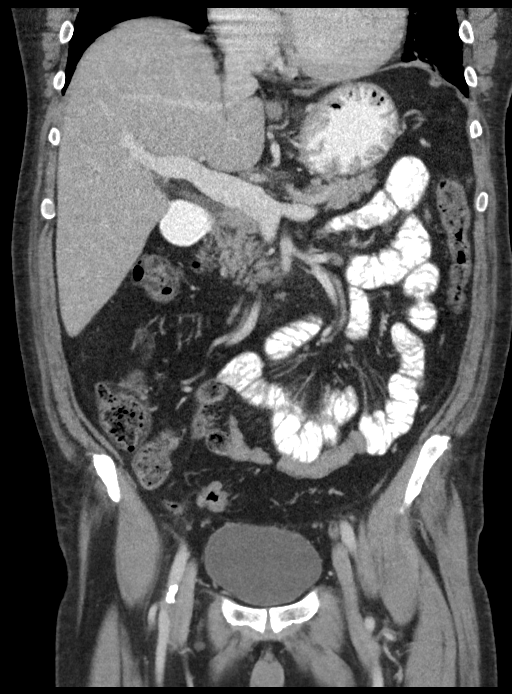
[im 51/92  soft-tissue]
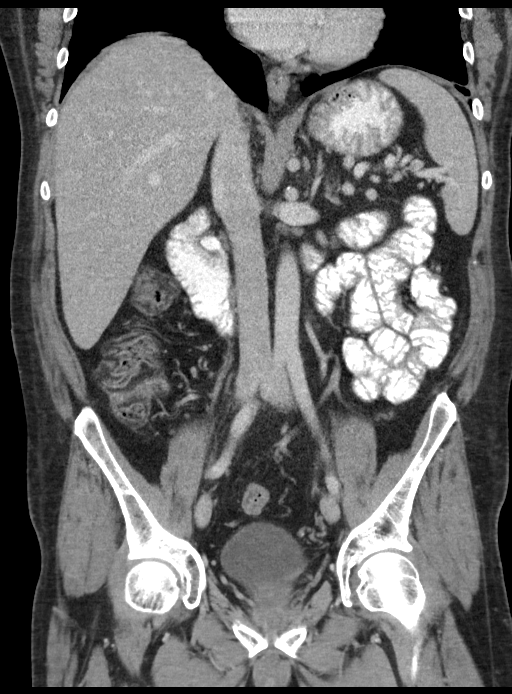

[15 of 46 positions shown; findings below may reference images not displayed]

FINDINGS: Lower chest: Mild scattered atelectatic changes noted within the
visualized lung bases. Visualized lungs are otherwise clear. No
pleural or pericardial fusion.

Hepatobiliary: Subcentimeter hypodensity within the posterior right
hepatic lobe noted, too small the characterize, but may reflect a
small cyst. Liver is otherwise unremarkable. Gallbladder is absent.
No biliary dilatation.

Pancreas: Pancreas within normal limits.

Spleen: Spleen within normal limits.

Adrenals/Urinary Tract: Adrenal glands are normal. Kidneys equal in
size with symmetric enhancement. Scattered bilateral nonobstructive
nephrolithiasis. No hydronephrosis. No focal enhancing renal mass.
Tiny hypodensity at the lower pole the right kidney noted, too small
the characterize. No hydroureter. Bladder within normal limits.

Stomach/Bowel: Stomach within normal limits. Small duodenal
diverticulum noted arising from the third portion of the duodenum.
No evidence for bowel obstruction. Appendix well visualized within
the right lower quadrant and is of normal caliber and appearance
associated inflammatory changes to suggest acute appendicitis.
Colonic diverticulosis without evidence for acute diverticulitis.
Cecum and ascending colon are incompletely distended with secondary
mild circumferential wall thickening, stable from prior. Possible
mild colitis could considered in the correct clinical setting.
Overall appearance is stable from recent CT. No other acute
inflammatory changes elsewhere about the bowels.

Vascular/Lymphatic: Normal intravascular enhancement seen throughout
the intra-abdominal aorta and its branch vessels. No adenopathy.

Reproductive: Prostate within normal limits.

Other: No free air or fluid. Fat containing ventral hernia noted
without inflammation.

Musculoskeletal: No acute osseous abnormality. No worrisome lytic or
blastic osseous lesions. Mild degenerative disc bulging noted at
L5-S1.
IMPRESSION: 1. Mild wall thickening about the cecum and ascending colon, favored
to be related to incomplete distension, although possible mild acute
colitis could be considered in the correct clinical setting.
2. Normal appendix.
3. Colonic diverticulosis without evidence for acute diverticulitis.
4. Bilateral nonobstructive nephrolithiasis.

## 2017-08-21 MED ORDER — BACITRACIN ZINC 500 UNIT/GM EX OINT
TOPICAL_OINTMENT | CUTANEOUS | Status: AC
  Filled 2017-08-21: qty 0.9

## 2017-08-21 MED ORDER — BACITRACIN ZINC 500 UNIT/GM EX OINT
TOPICAL_OINTMENT | CUTANEOUS | Status: AC
  Administered 2017-08-21: 1 via TOPICAL

## 2017-08-21 NOTE — ED Provider Notes (Signed)
-----------------------------------------   12:09 AM on 08/21/2017 -----------------------------------------  Patient has been seen by psychiatry. They believe the patient is safe for discharge home. Does not meet IVC criteria and the IVC has been reversed. We will provide information for RTS. patient's medical workup has been largely nonrevealing, alcohol level 199 greater than 4 hours ago.we will discharge the patient from the emergency department.   Harvest Dark, MD 08/21/17 [phone]

## 2017-08-21 NOTE — ED Notes (Signed)
Reviewed discharge instructions, follow-up instructions, suture care, suture removal, OTC antibiotic ointment with patient. Patient verbalized understanding.   Patient wheeled out to lobby to wait for ride.

## 2017-09-13 ENCOUNTER — Encounter: Payer: Self-pay | Admitting: Emergency Medicine

## 2017-09-13 ENCOUNTER — Emergency Department: Payer: Self-pay

## 2017-09-13 ENCOUNTER — Emergency Department
Admission: EM | Admit: 2017-09-13 | Discharge: 2017-09-13 | Disposition: A | Discharge status: Home / Self Care | Payer: Self-pay | Attending: Emergency Medicine | Admitting: Emergency Medicine

## 2017-09-13 DIAGNOSIS — Z79899 Other long term (current) drug therapy: Secondary | ICD-10-CM | POA: Insufficient documentation

## 2017-09-13 DIAGNOSIS — W19XXXA Unspecified fall, initial encounter: Secondary | ICD-10-CM | POA: Insufficient documentation

## 2017-09-13 DIAGNOSIS — S0990XA Unspecified injury of head, initial encounter: Secondary | ICD-10-CM

## 2017-09-13 DIAGNOSIS — Y929 Unspecified place or not applicable: Secondary | ICD-10-CM | POA: Insufficient documentation

## 2017-09-13 DIAGNOSIS — J45909 Unspecified asthma, uncomplicated: Secondary | ICD-10-CM | POA: Insufficient documentation

## 2017-09-13 DIAGNOSIS — I10 Essential (primary) hypertension: Secondary | ICD-10-CM | POA: Insufficient documentation

## 2017-09-13 DIAGNOSIS — Y939 Activity, unspecified: Secondary | ICD-10-CM | POA: Insufficient documentation

## 2017-09-13 DIAGNOSIS — Y999 Unspecified external cause status: Secondary | ICD-10-CM | POA: Insufficient documentation

## 2017-09-13 DIAGNOSIS — Z87891 Personal history of nicotine dependence: Secondary | ICD-10-CM | POA: Insufficient documentation

## 2017-09-13 DIAGNOSIS — S060X1A Concussion with loss of consciousness of 30 minutes or less, initial encounter: Secondary | ICD-10-CM | POA: Insufficient documentation

## 2017-09-13 MED ORDER — KETOROLAC TROMETHAMINE 60 MG/2ML IM SOLN
60.0000 mg | Freq: Once | INTRAMUSCULAR | Status: AC
Start: 2017-09-13 — End: 2017-09-13
  Administered 2017-09-13: 60 mg via INTRAMUSCULAR
  Filled 2017-09-13: qty 2

## 2017-09-13 MED ORDER — BUTALBITAL-APAP-CAFFEINE 50-325-40 MG PO TABS
2.0000 | ORAL_TABLET | Freq: Once | ORAL | Status: AC
  Administered 2017-09-13: 2 via ORAL
  Filled 2017-09-13: qty 2

## 2017-09-13 NOTE — ED Notes (Signed)
Patient to stat desk asking about wait time. Patient given update on wait time.  

## 2017-09-13 NOTE — ED Notes (Signed)
Pt reports falling backwards and striking head on non-carpeted floor after tripping on carpet, pt reports loss of consciousness for 10 min, pt denies ETOH, pt also reports lac to right knee, minor lac and little bleeding on jeans

## 2017-09-13 NOTE — ED Triage Notes (Signed)
Patient brought in by ems from home. Patient states that he tripped over the carpet and fell backwards. Patient with hematoma to the back of his head. Patient states that he did loose consciousness. Patient states that he does not take any anticoagulants.

## 2017-09-13 NOTE — Discharge Instructions (Signed)
Please follow up with your primary care physician for further evaluation of your symptoms.  °

## 2017-09-13 NOTE — ED Provider Notes (Signed)
[facility] Emergency Department Provider Note   ____________________________________________   First MD Initiated Contact with Patient 09/13/17 [phone]     (approximate)  I have reviewed the triage vital signs and the nursing notes.   HISTORY  Chief Complaint Fall; Head Injury; and Knee Pain    HPI Miguel [name] is a 56 y.o. male With a history of alcohol abuse who comes into the hospital today with a fall. The patient reports that he fell flat on his back and hit his head. He reports that his slippers caught the edge of a carpet and he tripped and fell. He reports that he did have 10 minutes of loss of consciousness. He also states that he skinned his right knee. He states that he also saw some areas on his left ankle that he is unsure if that might have been injured today as well. He reports that his head is with hurting him the most. He has a big bump on the back of his head and rates his pain a 9 out of 10 in intensity. He has nausea but denies any other pain. He reports that his knee doesn't hurt much and his ankle doesn't hurt at all. He is here for evaluation.   Past Medical History:  Diagnosis Date  . Alcohol abuse   . Asthma   . GERD (gastroesophageal reflux disease)   . Gout   . Hypertension   . OCD (obsessive compulsive disorder)   . Renal colic     Patient Active Problem List   Diagnosis Date Noted  . Suicide and self-inflicted injury by cutting and piercing instrument ([place]) 03/10/2017  . Severe recurrent major 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 ([place]) 03/09/2017  . Substance induced mood disorder ([place]) 08/15/2016  . Involuntary commitment 08/15/2016  . Alcohol use disorder, severe, dependence ([place]) 02/05/2016  . Hypertension 12/05/2015  . Tachycardia 12/05/2015  . Gout 11/13/2015  . Chronic back pain 05/02/2015    Past Surgical History:  Procedure Laterality Date  . CHOLECYSTECTOMY  2012    Prior to Admission medications     Medication Sig Start Date End Date Taking? Authorizing Provider  albuterol (VENTOLIN HFA) 108 (90 Base) MCG/ACT inhaler Inhale 2 puffs into the lungs every 4 (four) hours as needed for wheezing or shortness of breath. 03/12/17   Pucilowska, Jolanta B, MD  allopurinol (ZYLOPRIM) 100 MG tablet TAKE ONE TABLET BY MOUTH EVERY DAY 03/19/17   Tawni Millers, MD  allopurinol (ZYLOPRIM) 300 MG tablet Take 300 mg by mouth every morning.    [provider]  chlordiazePOXIDE (LIBRIUM) 25 MG capsule Take 1 capsule (25 mg total) by mouth 3 (three) times daily as needed for anxiety or withdrawal. 08/09/17   Darel Hong, MD  dexlansoprazole (DEXILANT) 60 MG capsule Take 60 mg by mouth every morning.    [provider]  FLUoxetine (PROZAC) 20 MG capsule Take 1 capsule (20 mg total) by mouth daily. 03/12/17   Pucilowska, Jolanta B, MD  Fluticasone-Salmeterol (ADVAIR) 100-50 MCG/DOSE AEPB Inhale 1 puff into the lungs 2 (two) times daily.    [provider]  lisinopril (PRINIVIL,ZESTRIL) 20 MG tablet Take 1 tablet (20 mg total) by mouth every morning. 03/12/17   Pucilowska, Herma Ard B, MD  omeprazole (PRILOSEC) 20 MG capsule Take 20 mg by mouth daily.    [provider]  risperiDONE (RISPERDAL) 2 MG tablet Take 1 tablet (2 mg total) by mouth 2 (two) times daily. 03/12/17   Pucilowska, Herma Ard  B, MD  traZODone (DESYREL) 50 MG tablet Take 1 tablet (50 mg total) by mouth at bedtime as needed for sleep. 03/12/17   Pucilowska, Wardell Honour, MD    Allergies Patient has no known allergies.  No family history on file.  Social History Social History  Substance Use Topics  . Smoking status: Former Research scientist (life sciences)  . Smokeless tobacco: Never Used  . Alcohol use No     Comment: HX Alcohol Abuse. Last drink 03/15/17    Review of Systems  Constitutional: No fever/chills Eyes: No visual changes. ENT: No sore throat. Cardiovascular: Denies chest pain. Respiratory: Denies shortness of  breath. Gastrointestinal: No abdominal pain.  No nausea, no vomiting.  No diarrhea.  No constipation. Genitourinary: Negative for dysuria. Musculoskeletal: Negative for back pain. Skin: right knee abrasion Neurological: headache   ____________________________________________   PHYSICAL EXAM:  VITAL SIGNS: ED Triage Vitals  Enc Vitals Group     BP 09/13/17 0222 113/81     Pulse Rate 09/13/17 0222 81     Resp 09/13/17 0222 18     Temp 09/13/17 0225 97.6 F (36.4 C)     Temp Source 09/13/17 0225 Oral     SpO2 09/13/17 0222 99 %     Weight 09/13/17 0220 180 lb (81.6 kg)     Height 09/13/17 0220 6' (1.829 m)     Head Circumference --      Peak Flow --      Pain Score 09/13/17 0220 9     Pain Loc --      Pain Edu? --      Excl. in [place]? --     Constitutional: Alert and oriented. Well appearing and in mild distress. Eyes: Conjunctivae are normal. PERRL. EOMI. Head: Atraumatic. Nose: No congestion/rhinnorhea. Mouth/Throat: Mucous membranes are moist.  Oropharynx non-erythematous. Neck: No cervical spine tenderness to palpation. Cardiovascular: Normal rate, regular rhythm. Grossly normal heart sounds.  Good peripheral circulation. Respiratory: Normal respiratory effort.  No retractions. Lungs CTAB. Gastrointestinal: Soft and nontender. No distention. positive bowel sounds Musculoskeletal: mild swelling to the left knee with an abrasion Neurologic:  Normal speech and language. patient with some shakiness Skin:  Skin is warm, dry and intact. abrasion to the right knee and left ankle Psychiatric: Mood and affect are normal.  ____________________________________________   LABS (all labs ordered are listed, but only abnormal results are displayed)  Labs Reviewed - No data to display ____________________________________________  EKG  none ____________________________________________  RADIOLOGY  Ct Head Wo Contrast  Result Date: 09/13/2017 CLINICAL DATA:  Tripped on  carpet, fell backwards. Posterior head hematoma. 10 minutes loss of consciousness. History of hypertension and alcohol abuse. EXAM: CT HEAD WITHOUT CONTRAST TECHNIQUE: Contiguous axial images were obtained from the base of the skull through the vertex without intravenous contrast. COMPARISON:  CT HEAD August 16, 2017 FINDINGS: BRAIN: No intraparenchymal hemorrhage, mass effect nor midline shift. Mild to moderate ventriculomegaly on the basis of global parenchymal brain volume loss, stable from prior imaging. No acute large vascular territory infarcts. No abnormal extra-axial fluid collections. Basal cisterns are patent. VASCULAR: Unremarkable. SKULL/SOFT TISSUES: No skull fracture. Posterior scalp subcutaneous fat stranding compatible with contusion. No subcutaneous gas or radiopaque foreign bodies. ORBITS/SINUSES: The included ocular globes and orbital contents are normal.Mild ethmoid mucosal thickening. Mastoid air cells are well aerated. OTHER: None. IMPRESSION: 1. No acute intracranial process. 2. Stable mild mild to moderate parenchymal brain volume loss. Electronically Signed   By: Elon Alas M.D.   On:  09/13/2017 03:05   Dg Knee Complete 4 Views Right  Result Date: 09/13/2017 CLINICAL DATA:  Trip on carpet and fall.  Right knee pain. EXAM: RIGHT KNEE - COMPLETE 4+ VIEW COMPARISON:  None. FINDINGS: No evidence of fracture, dislocation, or joint effusion. No evidence of arthropathy or other focal bone abnormality. Soft tissues are unremarkable. Vascular calcifications are seen. IMPRESSION: No fracture or subluxation of the right knee. Electronically Signed   By: Jeb Levering M.D.   On: 09/13/2017 03:19    ____________________________________________   PROCEDURES  Procedure(s) performed: None  Procedures  Critical Care performed: No  ____________________________________________   INITIAL IMPRESSION / ASSESSMENT AND PLAN / ED COURSE  As part of my medical decision making, I  reviewed the following data within the electronic MEDICAL RECORD NUMBER Notes from prior ED visits and [place] Controlled Substance Database   this is a 56 year old male who comes into the hospital today after a fall. The patient reports that he tripped over carpet with his slippers. The patient did receive a CT of his head as well as an x-ray of his knee and both were negative. The patient is very shaky but does have a history of alcoholism. I did give the patient a shot of Toradol for his pain. I also splinted him that he may have some concussive symptoms such as nausea and vomiting but he should return with any worsening pain or any other concerns. The patient will be discharged home. We will try to have someone pick the patient up so he may safely get home.      ____________________________________________   FINAL CLINICAL IMPRESSION(S) / ED DIAGNOSES  Final diagnoses:  Concussion with loss of consciousness of 30 minutes or less, initial encounter  Injury of head, initial encounter  Fall, initial encounter      NEW MEDICATIONS STARTED DURING THIS VISIT:  New Prescriptions   No medications on file     Note:  This document was prepared using Dragon voice recognition software and may include unintentional dictation errors.    Loney Hering, MD 09/13/17 [phone]

## 2017-09-17 ENCOUNTER — Encounter: Payer: Self-pay | Admitting: Emergency Medicine

## 2017-09-17 DIAGNOSIS — W269XXA Contact with unspecified sharp object(s), initial encounter: Secondary | ICD-10-CM | POA: Insufficient documentation

## 2017-09-17 DIAGNOSIS — Y999 Unspecified external cause status: Secondary | ICD-10-CM | POA: Insufficient documentation

## 2017-09-17 DIAGNOSIS — Y939 Activity, unspecified: Secondary | ICD-10-CM | POA: Insufficient documentation

## 2017-09-17 DIAGNOSIS — Z5321 Procedure and treatment not carried out due to patient leaving prior to being seen by health care provider: Secondary | ICD-10-CM | POA: Insufficient documentation

## 2017-09-17 DIAGNOSIS — Y929 Unspecified place or not applicable: Secondary | ICD-10-CM | POA: Insufficient documentation

## 2017-09-17 DIAGNOSIS — S51812A Laceration without foreign body of left forearm, initial encounter: Secondary | ICD-10-CM | POA: Insufficient documentation

## 2017-09-17 NOTE — ED Triage Notes (Signed)
Pt comes into the ED via POV c/o laceration to the top of the left forearm where he cut it on his toolbox at home.  Patient has minimal bleeding at this time and laceration is only 1/4" long.  Patient in NAD at this time.  New bandage placed over and bleeding is under control.  Patient states he is up to date on his tetanus shot.

## 2017-09-18 ENCOUNTER — Emergency Department
Admission: EM | Admit: 2017-09-18 | Discharge: 2017-09-18 | Disposition: A | Discharge status: Home / Self Care | Payer: Self-pay | Attending: Emergency Medicine | Admitting: Emergency Medicine

## 2017-09-30 ENCOUNTER — Telehealth: Payer: Self-pay

## 2017-09-30 NOTE — Telephone Encounter (Signed)
Placed signed application/script in [facility] folder for pickup. 

## 2017-09-30 NOTE — Telephone Encounter (Signed)
Received PAP application from [facility] for ventolin and advair placed for provider to sign.

## 2017-10-02 IMAGING — DX DG HAND COMPLETE 3+V*L*
3 series · 3 of 3 positions shown · non-contrast
Comparison: None.

CLINICAL DATA: Left hand pain after laceration.  09/16/2014.  One

EXAM:
LEFT HAND - COMPLETE 3+ VIEW

[hand ap]
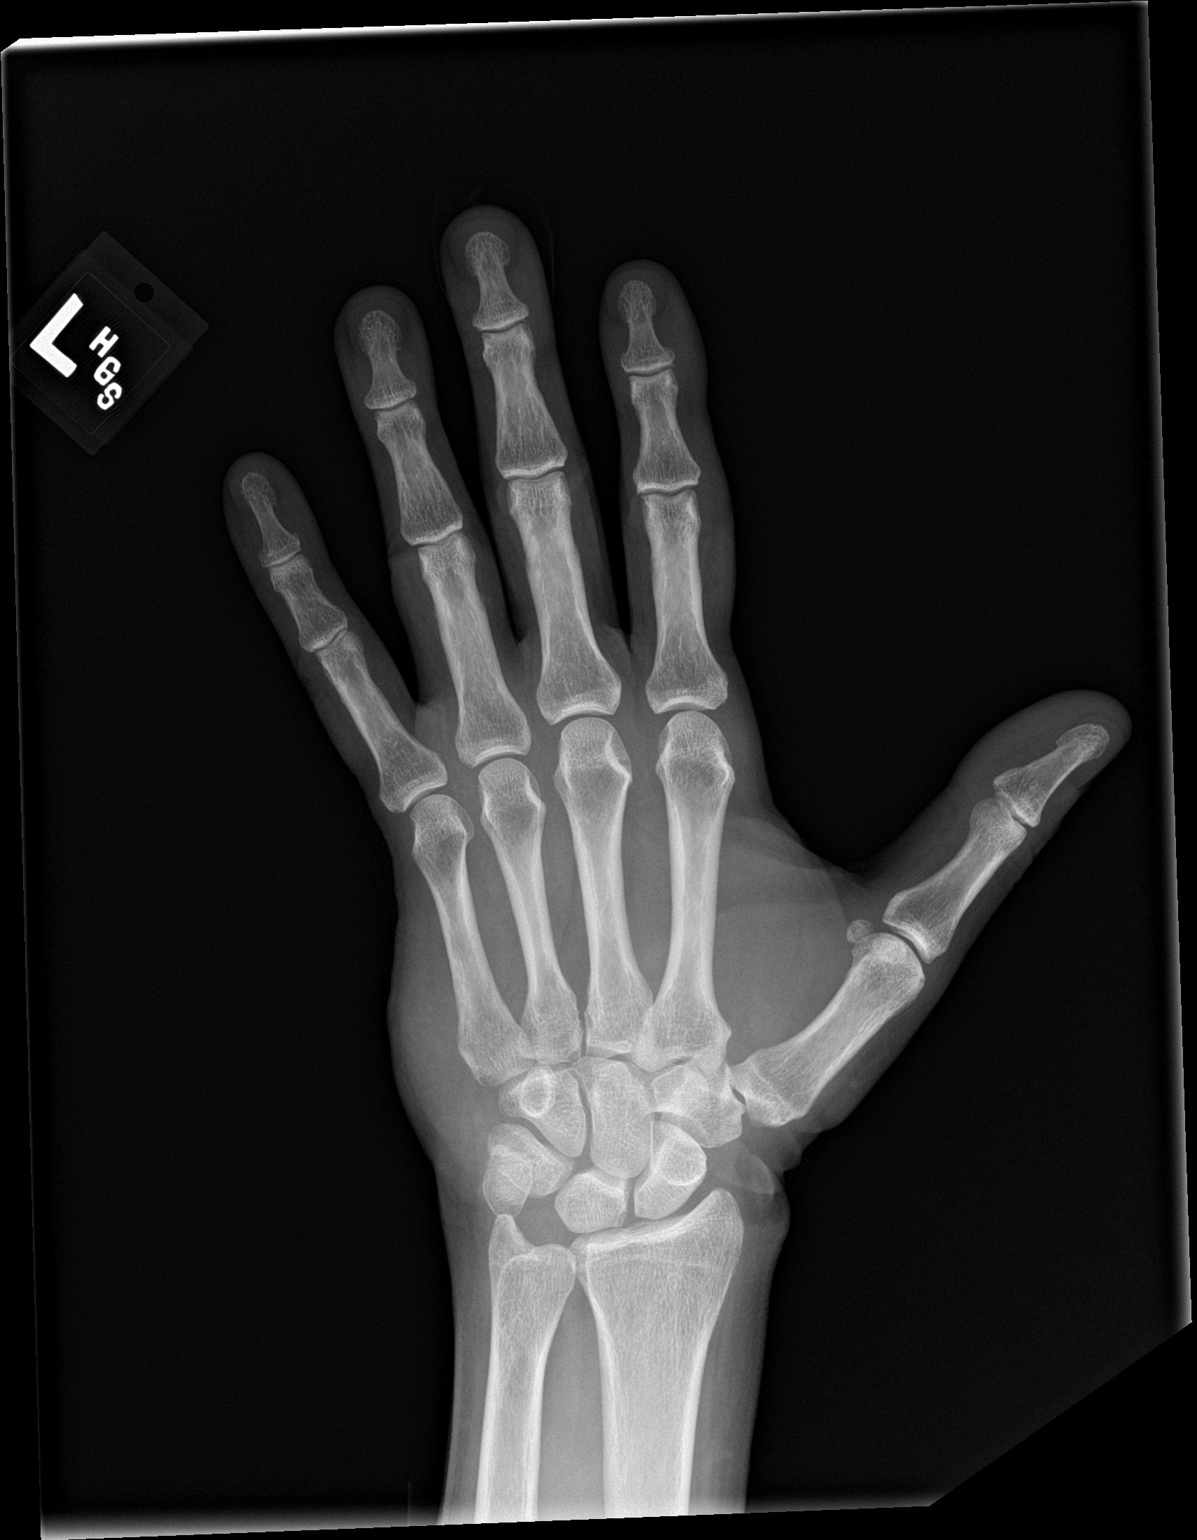

[hand obl]
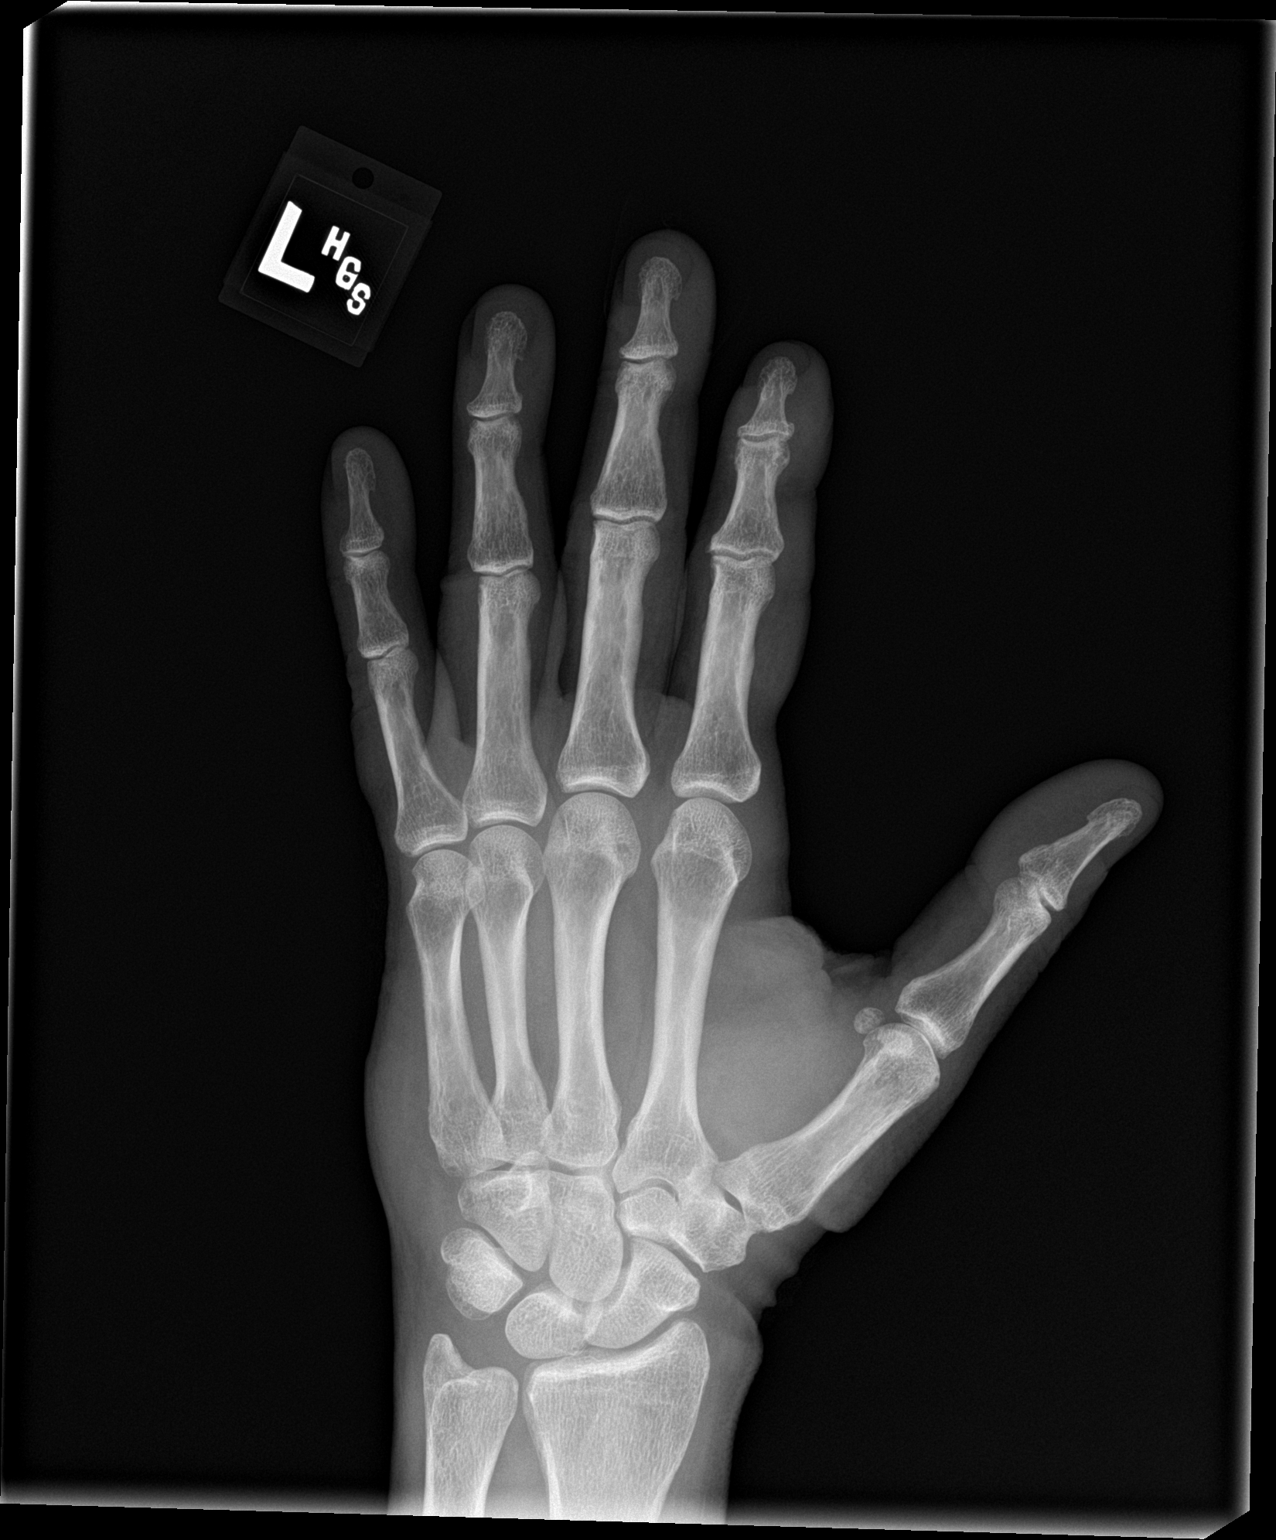

[hand lat]
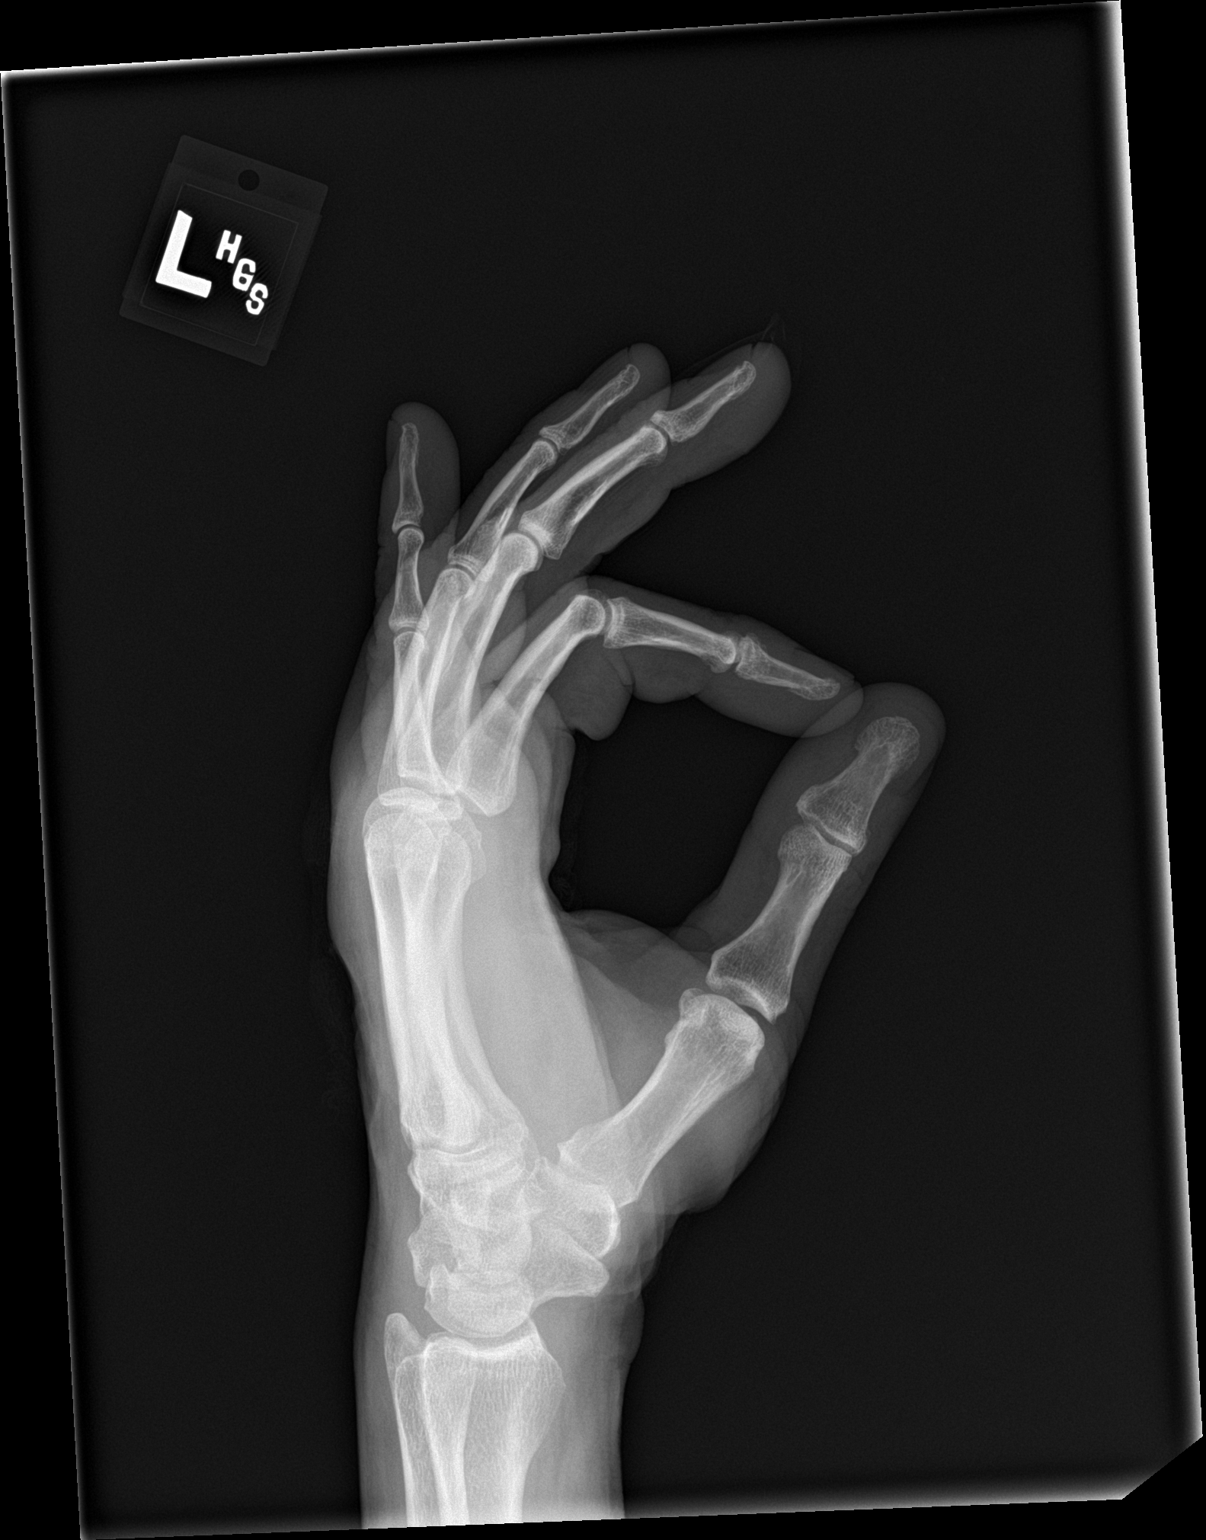

[3 of 3 positions shown; findings below may reference images not displayed]

FINDINGS: There is no evidence of fracture or dislocation. There is no
evidence of arthropathy or other focal bone abnormality. Soft
tissues are unremarkable.
IMPRESSION: Negative.

## 2017-10-03 ENCOUNTER — Encounter: Payer: Self-pay | Admitting: Emergency Medicine

## 2017-10-03 ENCOUNTER — Emergency Department
Admission: EM | Admit: 2017-10-03 | Discharge: 2017-10-03 | Disposition: A | Discharge status: Home / Self Care | Payer: Self-pay | Attending: Emergency Medicine | Admitting: Emergency Medicine

## 2017-10-03 ENCOUNTER — Emergency Department: Payer: Self-pay

## 2017-10-03 DIAGNOSIS — G8929 Other chronic pain: Secondary | ICD-10-CM | POA: Insufficient documentation

## 2017-10-03 DIAGNOSIS — Z79899 Other long term (current) drug therapy: Secondary | ICD-10-CM | POA: Insufficient documentation

## 2017-10-03 DIAGNOSIS — I10 Essential (primary) hypertension: Secondary | ICD-10-CM | POA: Insufficient documentation

## 2017-10-03 DIAGNOSIS — Z87891 Personal history of nicotine dependence: Secondary | ICD-10-CM | POA: Insufficient documentation

## 2017-10-03 DIAGNOSIS — R062 Wheezing: Secondary | ICD-10-CM | POA: Insufficient documentation

## 2017-10-03 DIAGNOSIS — K0889 Other specified disorders of teeth and supporting structures: Secondary | ICD-10-CM | POA: Insufficient documentation

## 2017-10-03 LAB — BASIC METABOLIC PANEL
Anion gap: 8 (ref 5–15)
BUN: 10 mg/dL (ref 6–20)
CO2: 25 mmol/L (ref 22–32)
Calcium: 8.7 mg/dL — ABNORMAL LOW (ref 8.9–10.3)
Chloride: 107 mmol/L (ref 101–111)
Creatinine, Ser: 0.95 mg/dL (ref 0.61–1.24)
GFR calc Af Amer: >60 mL/min (ref >60)
GFR calc non Af Amer: >60 mL/min (ref >60)
Glucose, Bld: 98 mg/dL (ref 65–99)
Potassium: 4.1 mmol/L (ref 3.5–5.1)
Sodium: 140 mmol/L (ref 135–145)

## 2017-10-03 LAB — CBC
HCT: 42.6 % (ref 40.0–52.0)
Hemoglobin: 14 g/dL (ref 13.0–18.0)
MCH: 30.3 pg (ref 26.0–34.0)
MCHC: 32.8 g/dL (ref 32.0–36.0)
MCV: 92.4 fL (ref 80.0–100.0)
Platelets: 296 10*3/uL (ref 150–440)
RBC: 4.62 MIL/uL (ref 4.40–5.90)
RDW: 14.1 % (ref 11.5–14.5)
WBC: 7.5 10*3/uL (ref 3.8–10.6)

## 2017-10-03 LAB — TROPONIN I: Troponin I: <0.03 ng/mL (ref <0.03)

## 2017-10-03 LAB — ETHANOL: Alcohol, Ethyl (B): 236 mg/dL — ABNORMAL HIGH (ref <10)

## 2017-10-03 MED ORDER — ALBUTEROL SULFATE (2.5 MG/3ML) 0.083% IN NEBU
INHALATION_SOLUTION | RESPIRATORY_TRACT | Status: AC
  Administered 2017-10-03: 2.5 mg via RESPIRATORY_TRACT
  Filled 2017-10-03: qty 3

## 2017-10-03 MED ORDER — ALBUTEROL SULFATE (2.5 MG/3ML) 0.083% IN NEBU
2.5000 mg | INHALATION_SOLUTION | Freq: Once | RESPIRATORY_TRACT | Status: AC
  Administered 2017-10-03: 2.5 mg via RESPIRATORY_TRACT

## 2017-10-03 MED ORDER — IPRATROPIUM-ALBUTEROL 0.5-2.5 (3) MG/3ML IN SOLN: 3.0000 mL | Freq: Once | RESPIRATORY_TRACT | Status: DC

## 2017-10-03 NOTE — Discharge Instructions (Signed)
Please follow up with your doctor.

## 2017-10-03 NOTE — ED Provider Notes (Signed)
[facility] Emergency Department Provider Note   ____________________________________________    I have reviewed the triage vital signs and the nursing notes.   HISTORY  Chief Complaint Multiple complaints    HPI Miguel [name] is a 56 y.o. male who presents with several complaints.  He complains of dental pain, mild wheezing, facial pain, back pain.  These do not appear to be related.  Patient is well-known to me and our emergency department.  He states that his primary concern is his wheezing and facial/dental pain.  He has not taken any medication for it although he does admit to alcohol abuse.  Complains of pain when he touches the right cheek area and pain in his right upper teeth which he says is somewhat chronic  Past Medical History:  Diagnosis Date  . Alcohol abuse   . Asthma   . GERD (gastroesophageal reflux disease)   . Gout   . Hypertension   . OCD (obsessive compulsive disorder)   . Renal colic     Patient Active Problem List   Diagnosis Date Noted  . Suicide and self-inflicted injury by cutting and piercing instrument ([place]) 03/10/2017  . Severe recurrent major 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 ([place]) 03/09/2017  . Substance induced mood disorder ([place]) 08/15/2016  . Involuntary commitment 08/15/2016  . Alcohol use disorder, severe, dependence ([place]) 02/05/2016  . Hypertension 12/05/2015  . Tachycardia 12/05/2015  . Gout 11/13/2015  . Chronic back pain 05/02/2015    Past Surgical History:  Procedure Laterality Date  . CHOLECYSTECTOMY  2012    Prior to Admission medications   Medication Sig Start Date End Date Taking? Authorizing Provider  albuterol (VENTOLIN HFA) 108 (90 Base) MCG/ACT inhaler Inhale 2 puffs into the lungs every 4 (four) hours as needed for wheezing or shortness of breath. 03/12/17   Pucilowska, Jolanta B, MD  allopurinol (ZYLOPRIM) 100 MG tablet TAKE ONE TABLET BY MOUTH EVERY DAY 03/19/17   Tawni Millers, MD    allopurinol (ZYLOPRIM) 300 MG tablet Take 300 mg by mouth every morning.    [provider]  chlordiazePOXIDE (LIBRIUM) 25 MG capsule Take 1 capsule (25 mg total) by mouth 3 (three) times daily as needed for anxiety or withdrawal. 08/09/17   Darel Hong, MD  dexlansoprazole (DEXILANT) 60 MG capsule Take 60 mg by mouth every morning.    [provider]  FLUoxetine (PROZAC) 20 MG capsule Take 1 capsule (20 mg total) by mouth daily. 03/12/17   Pucilowska, Jolanta B, MD  Fluticasone-Salmeterol (ADVAIR) 100-50 MCG/DOSE AEPB Inhale 1 puff into the lungs 2 (two) times daily.    [provider]  lisinopril (PRINIVIL,ZESTRIL) 20 MG tablet Take 1 tablet (20 mg total) by mouth every morning. 03/12/17   Pucilowska, Herma Ard B, MD  omeprazole (PRILOSEC) 20 MG capsule Take 20 mg by mouth daily.    [provider]  risperiDONE (RISPERDAL) 2 MG tablet Take 1 tablet (2 mg total) by mouth 2 (two) times daily. 03/12/17   Pucilowska, Herma Ard B, MD  traZODone (DESYREL) 50 MG tablet Take 1 tablet (50 mg total) by mouth at bedtime as needed for sleep. 03/12/17   Pucilowska, Wardell Honour, MD     Allergies Patient has no known allergies.  No family history on file.  Social History Social History   Tobacco Use  . Smoking status: Former Research scientist (life sciences)  . Smokeless tobacco: Never Used  Substance Use Topics  . Alcohol use: Yes    Alcohol/week: 4.2 oz  Types: 7 Glasses of wine per week    Comment: Pt states, "I quit all the hard stuff 4 months ago"  . Drug use: No    Review of Systems  Constitutional: No fever/chills Eyes: No visual changes.  ENT: No sore throat.  Dental pain as above Cardiovascular: Denies chest pain. Respiratory: As above Gastrointestinal: No abdominal pain.  No nausea, no vomiting.   Genitourinary: Negative for dysuria. Musculoskeletal: Right lower back pain Skin: Negative for rash. Neurological: Negative for headaches     ____________________________________________   PHYSICAL EXAM:  VITAL SIGNS: ED Triage Vitals  Enc Vitals Group     BP 10/03/17 1758 137/89     Pulse Rate 10/03/17 1758 72     Resp 10/03/17 1758 20     Temp 10/03/17 1758 98.1 F (36.7 C)     Temp Source 10/03/17 1758 Oral     SpO2 10/03/17 1758 100 %     Weight 10/03/17 1800 81.6 kg (180 lb)     Height 10/03/17 1800 1.829 m (6')     Head Circumference --      Peak Flow --      Pain Score 10/03/17 1758 9     Pain Loc --      Pain Edu? --      Excl. in [place]? --     Constitutional: Alert and oriented. No acute distress.  Anxious Eyes: Conjunctivae are normal.  Head: Atraumatic.  No facial swelling or erythema, mild tenderness palpation over the right upper jaw externally Nose: No congestion/rhinnorhea. Mouth/Throat: Mucous membranes are moist.  Poor dentition, no evidence of dental abscess. Neck:  Painless ROM Cardiovascular: Normal rate, regular rhythm. Grossly normal heart sounds.  Good peripheral circulation. Respiratory: Normal respiratory effort.  No retractions.  Minimal scattered wheezes Gastrointestinal: Soft and nontender. No distention.  No CVA tenderness. Genitourinary: deferred Musculoskeletal: .  Warm and well perfused Neurologic:  Normal speech and language. No gross focal neurologic deficits are appreciated.  Skin:  Skin is warm, dry and intact. No rash noted.   ____________________________________________   LABS (all labs ordered are listed, but only abnormal results are displayed)  Labs Reviewed  BASIC METABOLIC PANEL - Abnormal; Notable for the following components:      Result Value   Calcium 8.7 (*)    All other components within normal limits  ETHANOL - Abnormal; Notable for the following components:   Alcohol, Ethyl (B) 236 (*)    All other components within normal limits  CBC  TROPONIN I   ____________________________________________  EKG  ED ECG REPORT I, Lavonia Drafts, the  attending physician, personally viewed and interpreted this ECG.  Date: 10/03/2017  Rhythm: normal sinus rhythm QRS Axis: normal Intervals: normal ST/T Wave abnormalities: normal Narrative Interpretation: no evidence of acute ischemia  ____________________________________________  RADIOLOGY  Chest x-ray normal ____________________________________________   PROCEDURES  Procedure(s) performed: No  Procedures   Critical Care performed: No ____________________________________________   INITIAL IMPRESSION / ASSESSMENT AND PLAN / ED COURSE  Pertinent labs & imaging results that were available during my care of the patient were reviewed by me and considered in my medical decision making (see chart for details).  Patient well-appearing in no acute distress.  Exam is overall reassuring, no evidence of intraoral infection/abscess.  Neuro exam is normal, normal strength in all extremities.  No abdominal tenderness palpation.  Mild scattered wheezes treated with albuterol with resolution  ----------------------------------------- 6:49 PM on 10/03/2017 -----------------------------------------  Alcohol level elevated as expected,  patient with chronic alcoholism.  He does have someone to come pick him up.  Lab work is otherwise unremarkable.  He feels better after nebulizer.  I will have him take nsaids and f/u with pcp/ dentist      ____________________________________________   FINAL CLINICAL IMPRESSION(S) / ED DIAGNOSES  Final diagnoses:  Pain, dental  Wheezing        Note:  This document was prepared using Dragon voice recognition software and may include unintentional dictation errors.    Lavonia Drafts, MD 10/03/17 2005

## 2017-10-03 NOTE — ED Triage Notes (Signed)
Pt in via POV with complaints of numbness and pain to right side of face since Monday that has now radiated down to right lower back.  Pt states, "Im having trouble breathing also, I took my inhaler twice today."  Pt able to speak in full clear sentences, no signs of respiratory distress at this time, vitals WDL.

## 2017-10-03 NOTE — ED Notes (Signed)
Pt  Provided with phone per request.

## 2017-10-08 IMAGING — CR DG CHEST 2V
2 series · 2 of 2 positions shown · non-contrast
Comparison: Chest radiograph from 08/12/2016

CLINICAL DATA: Subacute onset of cough and shortness of breath when
lying down. Initial encounter.

EXAM:
CHEST  2 VIEW

[chest pa]
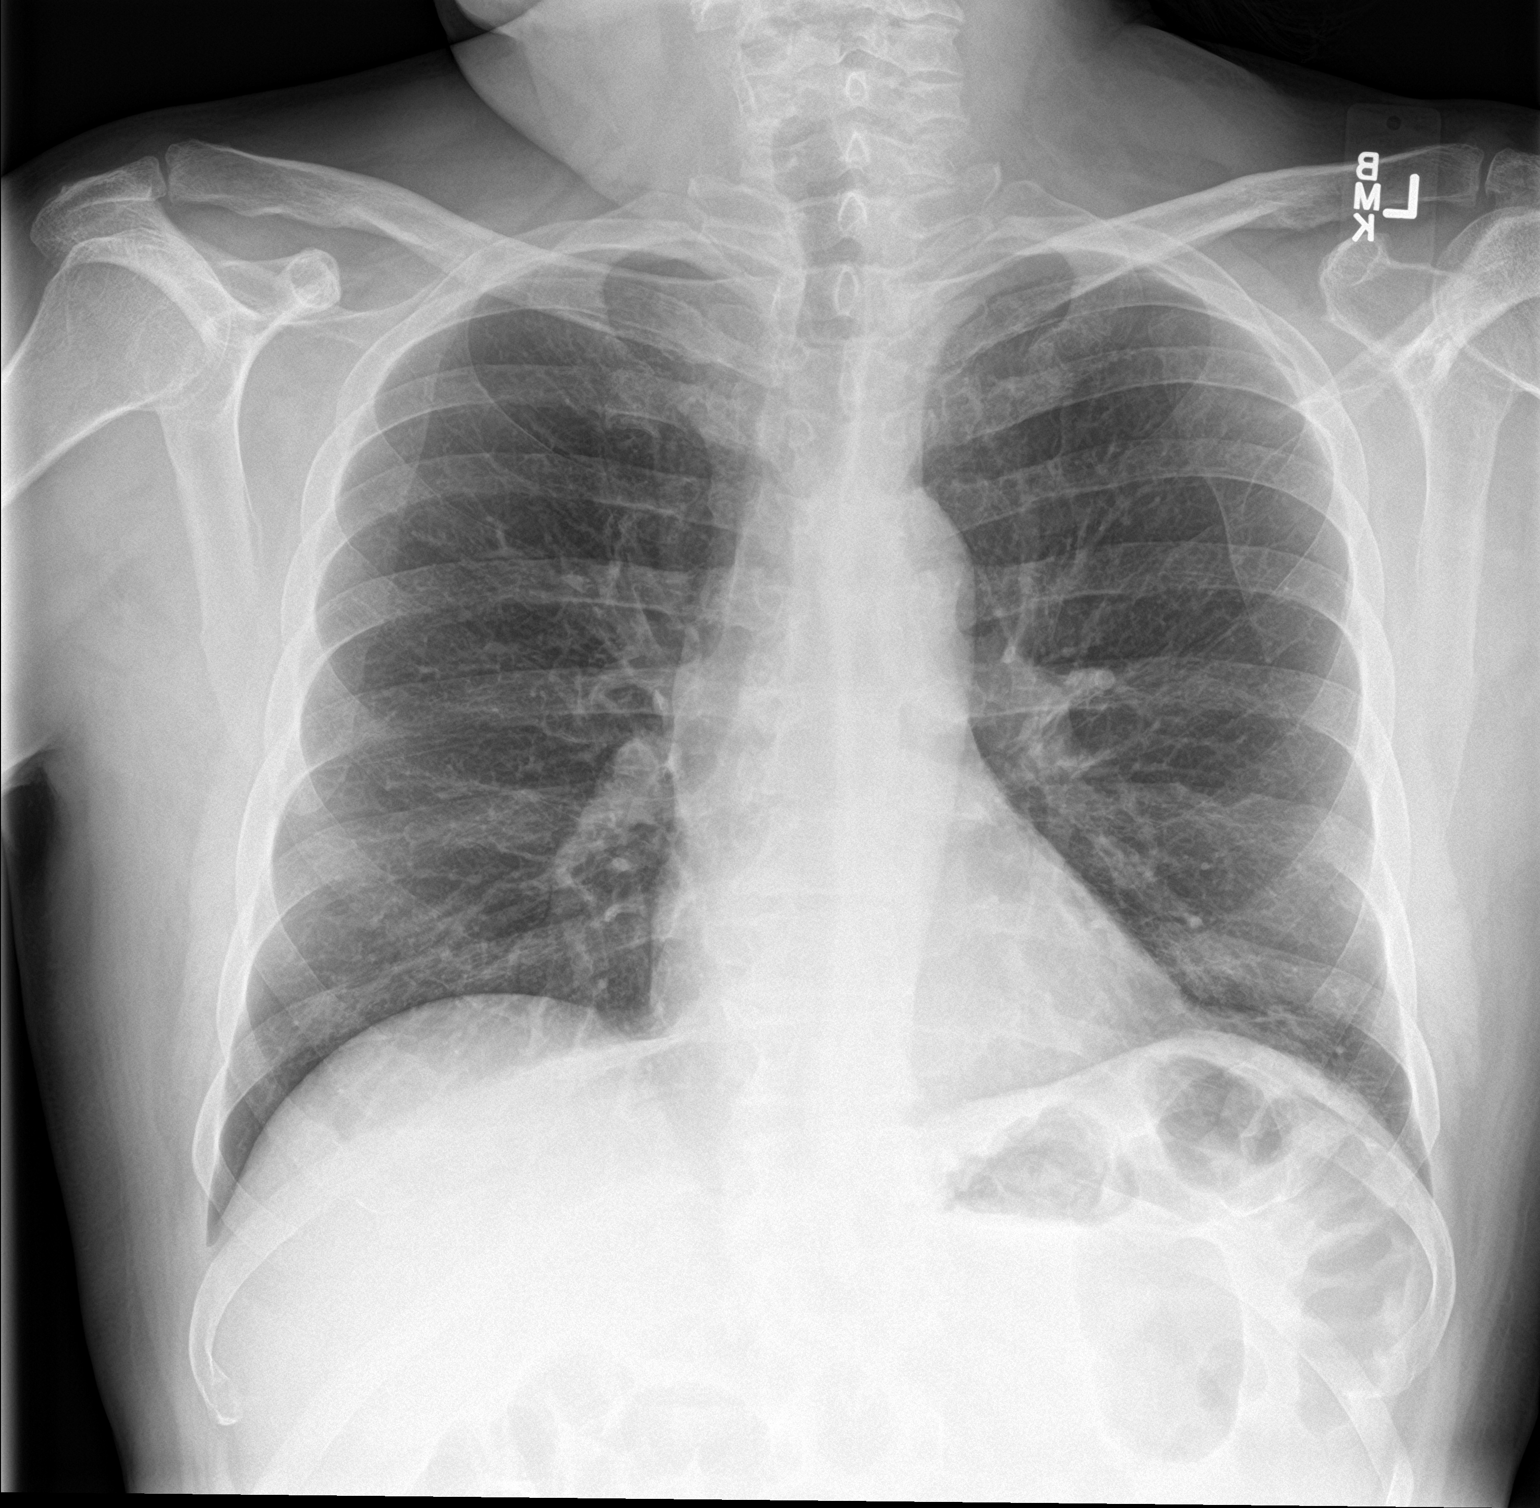

[chest lat]
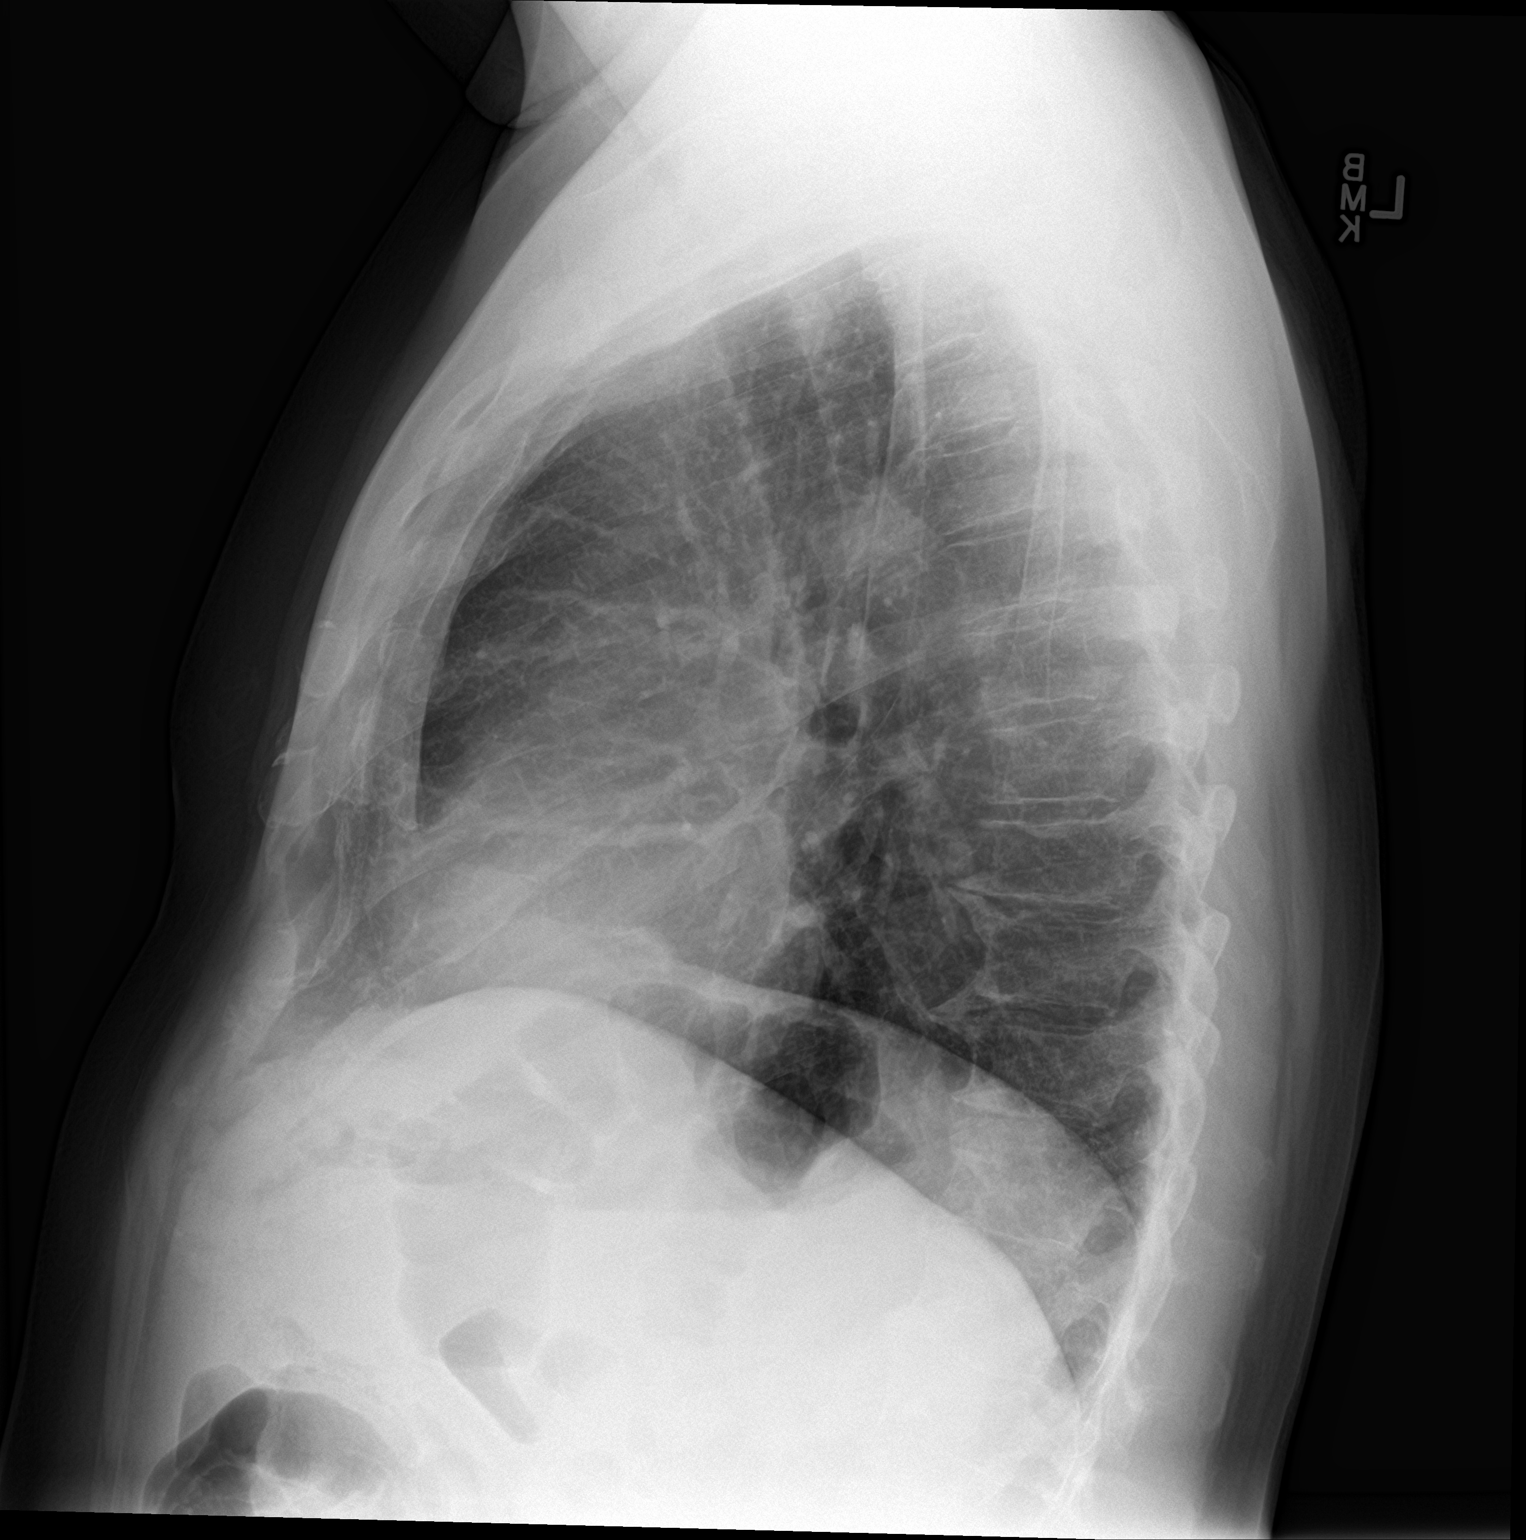

[2 of 2 positions shown; findings below may reference images not displayed]

FINDINGS: The lungs are well-aerated. Minimal left basilar atelectasis is
noted. There is no evidence of pleural effusion or pneumothorax.

The heart is normal in size; the mediastinal contour is within
normal limits. No acute osseous abnormalities are seen.
IMPRESSION: Minimal left basilar atelectasis noted. Lungs otherwise grossly
clear.

## 2017-10-18 ENCOUNTER — Other Ambulatory Visit: Payer: Self-pay | Admitting: Internal Medicine

## 2017-10-27 IMAGING — CT CT CERVICAL SPINE W/O CM
4 of 7 series · 13 of 33 positions shown, 14 images · non-contrast
Comparison: 12/07/2016, 03/23/2015

CLINICAL DATA: Upper neck pain after a fall this morning

EXAM:
CT HEAD WITHOUT CONTRAST
CT CERVICAL SPINE WITHOUT CONTRAST
TECHNIQUE: Multidetector CT imaging of the head and cervical spine was
performed following the standard protocol without intravenous
contrast. Multiplanar CT image reconstructions of the cervical spine
were also generated.

[Series 7: c spine soft · axial · 0.33mm/px · z∈[-308,-196]mm · 4 of 94 slices shown]
[im 19/94  soft-tissue]
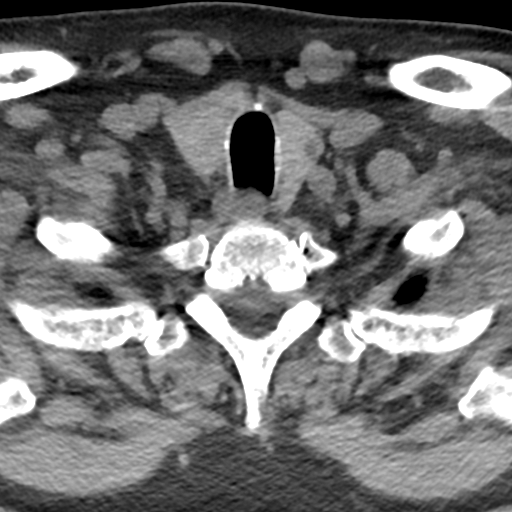
[im 38/94  soft-tissue]
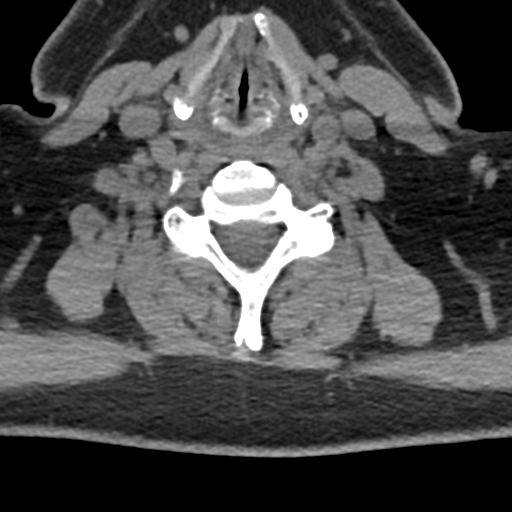
[im 56/94  soft-tissue]
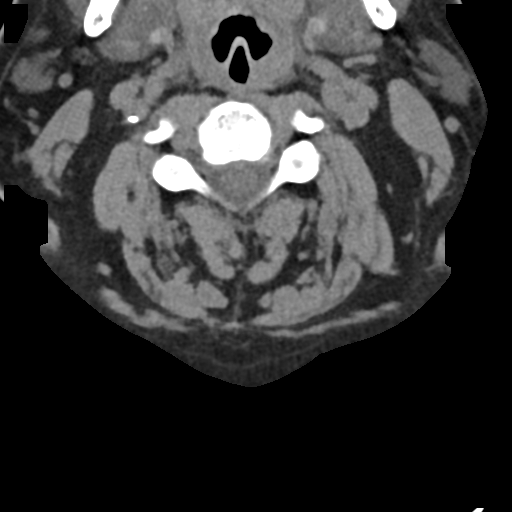
[im 75/94  soft-tissue]
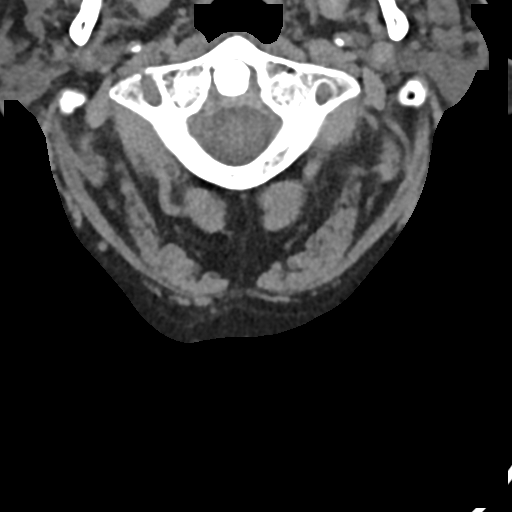

[Series 10: sagittal bone · sagittal · 0.27mm/px · 4 of 47 slices shown]
[im 10/47  bone]
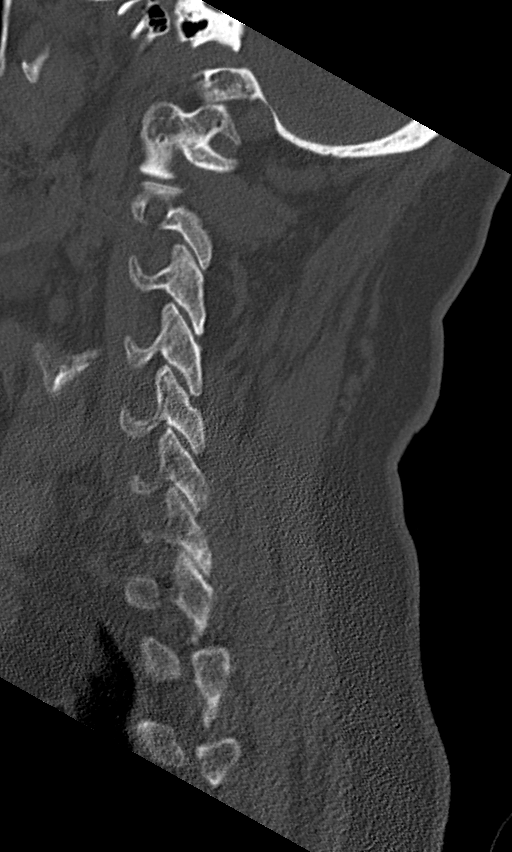
[im 19/47  bone]
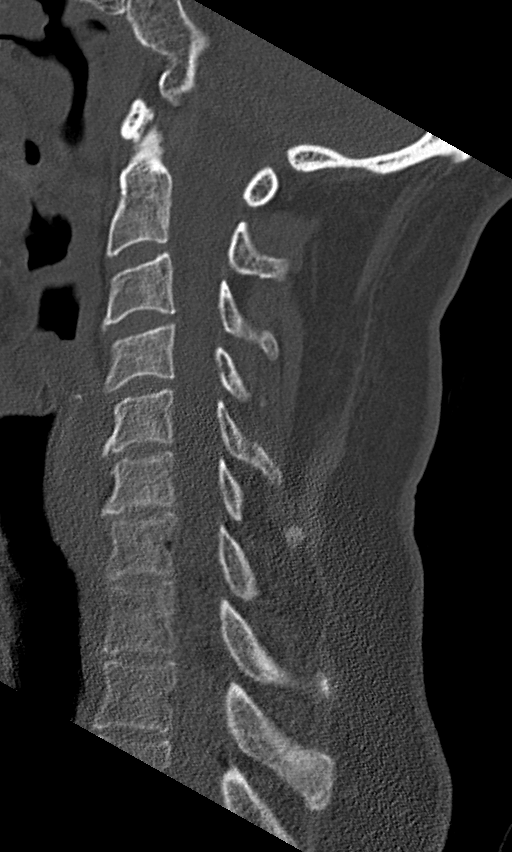
[im 28/47  bone]
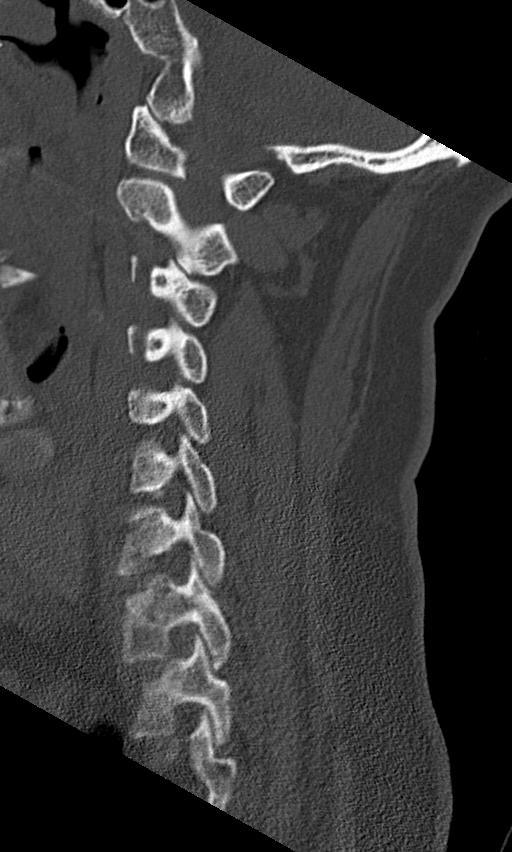
[im 37/47  bone]
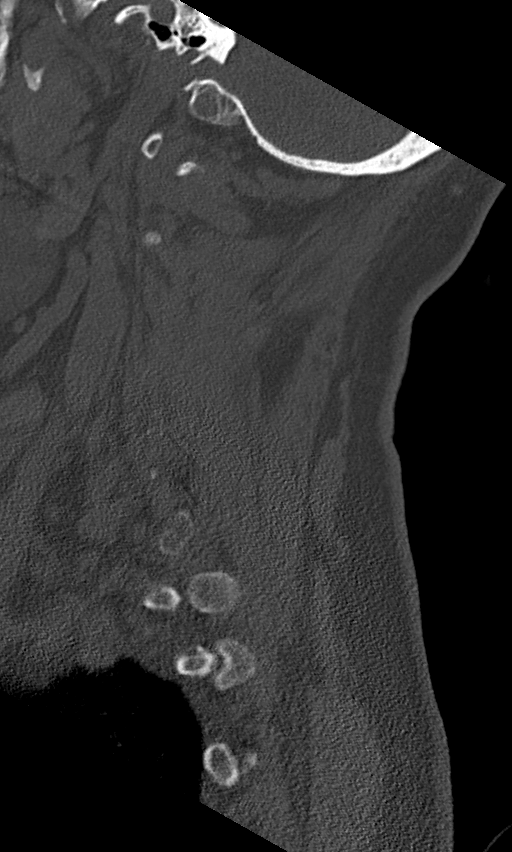

[Series 11: coronal bone · coronal · 0.26mm/px · 1 of 51 slices shown]
[im 26/51  bone]
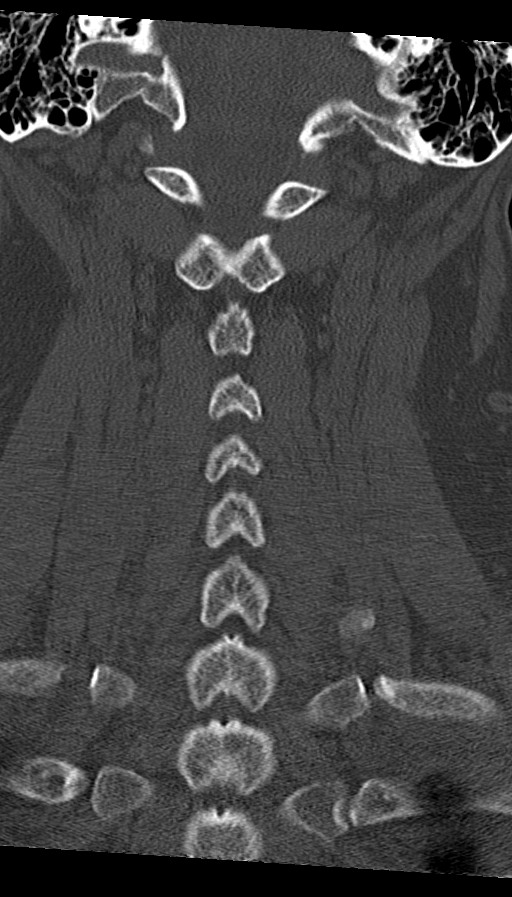

[Series 12: orthogonal bone · axial · 0.27mm/px · z∈[-333,-237]mm · 4 of 93 slices shown, 5 images]
[im 19/93  soft-tissue]
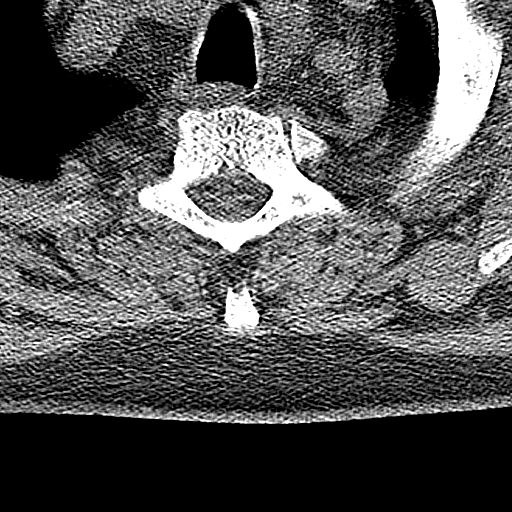
[im 19/93  bone]
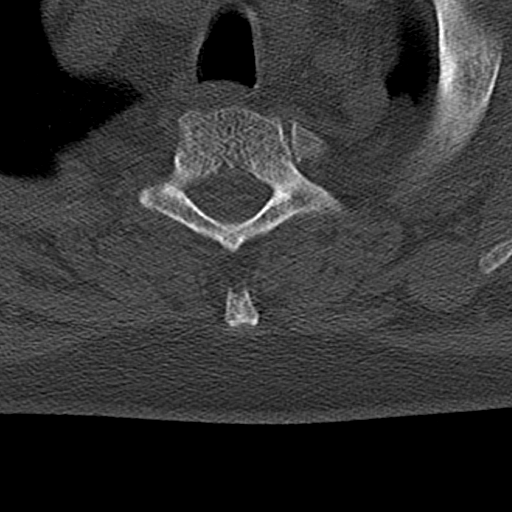
[im 37/93  bone]
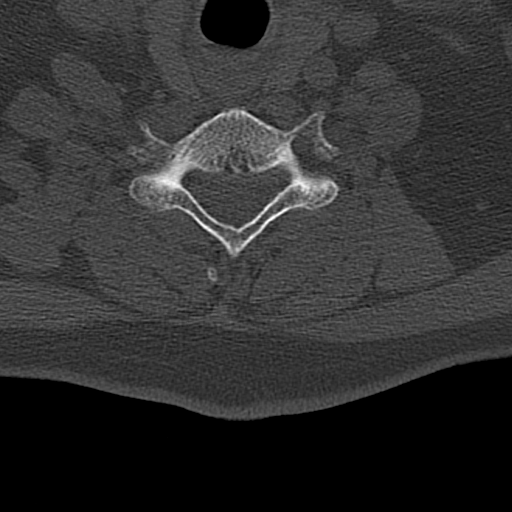
[im 56/93  bone]
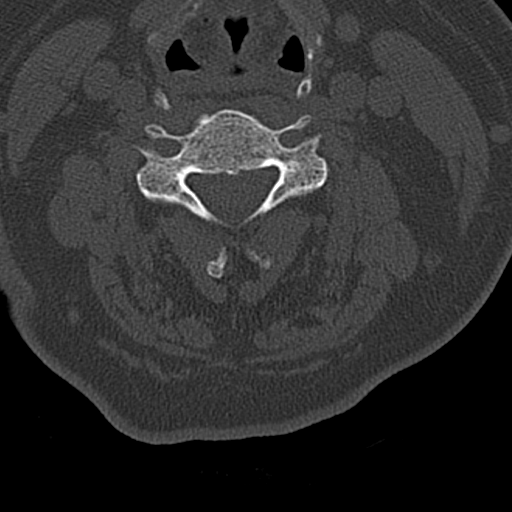
[im 74/93  bone]
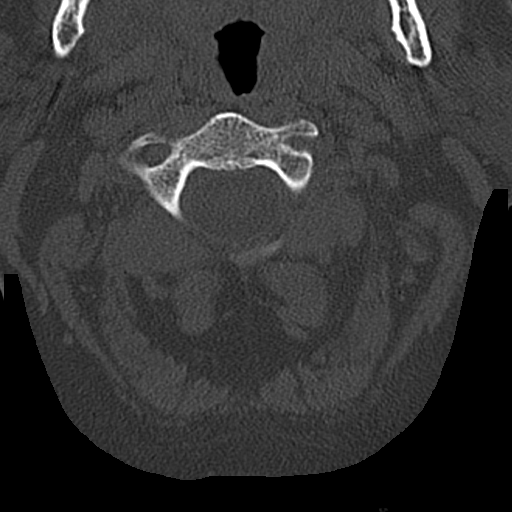

[13 of 33 positions shown; findings below may reference images not displayed]

FINDINGS: CT HEAD FINDINGS

Brain: No acute territorial infarction, intracranial hemorrhage, or
extra-axial fluid collection is seen. There is no focal mass, mass
effect or midline shift. There is mild atrophy. The ventricles are
stable in size.

Vascular: No hyperdense vessels. Minimal calcification at the
carotid siphons.

Skull: No fracture.  No suspicious bone lesion.

Sinuses/Orbits: Mucous retention cyst left ethmoid sinus. Mucosal
thickening in the ethmoid sinuses. No acute orbital abnormality.

Other: None

CT CERVICAL SPINE FINDINGS

Alignment: Straightening of the cervical spine. No subluxation.
Facet alignment within normal limits.

Skull base and vertebrae: Craniovertebral junction appears intact.
No fracture. Vertebral body heights are normal

Soft tissues and spinal canal: No prevertebral fluid or swelling. No
visible canal hematoma.

Disc levels: Mild multilevel degenerative disc changes at C5-C6 and
C6-C7.

Upper chest: Lung apices are clear. Small hypodense nodule left lobe
of thyroid.

Other: None
IMPRESSION: 1. No CT evidence for acute intracranial abnormality.
2. Straightening of the cervical spine. No acute fracture or
malalignment.

## 2017-11-12 ENCOUNTER — Telehealth: Payer: Self-pay | Admitting: Pharmacist

## 2017-11-12 NOTE — Telephone Encounter (Signed)
11/12/17 Called [facility] for refill on Colcrys 0.6 mg.AJ

## 2017-11-13 ENCOUNTER — Emergency Department
Admission: EM | Admit: 2017-11-13 | Discharge: 2017-11-13 | Disposition: A | Discharge status: Home / Self Care | Payer: Self-pay | Attending: Emergency Medicine | Admitting: Emergency Medicine

## 2017-11-13 ENCOUNTER — Other Ambulatory Visit: Payer: Self-pay

## 2017-11-13 ENCOUNTER — Emergency Department: Payer: Self-pay

## 2017-11-13 ENCOUNTER — Encounter: Payer: Self-pay | Admitting: Emergency Medicine

## 2017-11-13 DIAGNOSIS — Z87891 Personal history of nicotine dependence: Secondary | ICD-10-CM | POA: Insufficient documentation

## 2017-11-13 DIAGNOSIS — I10 Essential (primary) hypertension: Secondary | ICD-10-CM | POA: Insufficient documentation

## 2017-11-13 DIAGNOSIS — N132 Hydronephrosis with renal and ureteral calculous obstruction: Secondary | ICD-10-CM | POA: Insufficient documentation

## 2017-11-13 DIAGNOSIS — Z79899 Other long term (current) drug therapy: Secondary | ICD-10-CM | POA: Insufficient documentation

## 2017-11-13 DIAGNOSIS — J45909 Unspecified asthma, uncomplicated: Secondary | ICD-10-CM | POA: Insufficient documentation

## 2017-11-13 DIAGNOSIS — N201 Calculus of ureter: Secondary | ICD-10-CM

## 2017-11-13 LAB — BASIC METABOLIC PANEL
Anion gap: 13 (ref 5–15)
BUN: 7 mg/dL (ref 6–20)
CO2: 21 mmol/L — ABNORMAL LOW (ref 22–32)
Calcium: 8.9 mg/dL (ref 8.9–10.3)
Chloride: 107 mmol/L (ref 101–111)
Creatinine, Ser: 1.02 mg/dL (ref 0.61–1.24)
GFR calc Af Amer: >60 mL/min (ref >60)
GFR calc non Af Amer: >60 mL/min (ref >60)
Glucose, Bld: 132 mg/dL — ABNORMAL HIGH (ref 65–99)
Potassium: 3.3 mmol/L — ABNORMAL LOW (ref 3.5–5.1)
Sodium: 141 mmol/L (ref 135–145)

## 2017-11-13 LAB — URINALYSIS, COMPLETE (UACMP) WITH MICROSCOPIC
Bacteria, UA: NONE SEEN
Bilirubin Urine: NEGATIVE
Glucose, UA: NEGATIVE mg/dL
Ketones, ur: NEGATIVE mg/dL
Nitrite: NEGATIVE
Protein, ur: 30 mg/dL — AB
Specific Gravity, Urine: 1.008 (ref 1.005–1.030)
Squamous Epithelial / LPF: NONE SEEN
pH: 6 (ref 5.0–8.0)

## 2017-11-13 LAB — CBC WITH DIFFERENTIAL/PLATELET
Basophils Absolute: 0 10*3/uL (ref 0–0.1)
Basophils Relative: 1 %
Eosinophils Absolute: 0 10*3/uL (ref 0–0.7)
Eosinophils Relative: 0 %
HCT: 43.9 % (ref 40.0–52.0)
Hemoglobin: 14.6 g/dL (ref 13.0–18.0)
Lymphocytes Relative: 21 %
Lymphs Abs: 1.4 10*3/uL (ref 1.0–3.6)
MCH: 29.4 pg (ref 26.0–34.0)
MCHC: 33.4 g/dL (ref 32.0–36.0)
MCV: 88 fL (ref 80.0–100.0)
Monocytes Absolute: 0.5 10*3/uL (ref 0.2–1.0)
Monocytes Relative: 8 %
Neutro Abs: 4.8 10*3/uL (ref 1.4–6.5)
Neutrophils Relative %: 70 %
Platelets: 236 10*3/uL (ref 150–440)
RBC: 4.99 MIL/uL (ref 4.40–5.90)
RDW: 16 % — ABNORMAL HIGH (ref 11.5–14.5)
WBC: 6.8 10*3/uL (ref 3.8–10.6)

## 2017-11-13 LAB — HEPATIC FUNCTION PANEL
ALT: 13 U/L — ABNORMAL LOW (ref 17–63)
AST: 20 U/L (ref 15–41)
Albumin: 4.1 g/dL (ref 3.5–5.0)
Alkaline Phosphatase: 79 U/L (ref 38–126)
Bilirubin, Direct: 0.2 mg/dL (ref 0.1–0.5)
Indirect Bilirubin: 0.6 mg/dL (ref 0.3–0.9)
Total Bilirubin: 0.8 mg/dL (ref 0.3–1.2)
Total Protein: 7.2 g/dL (ref 6.5–8.1)

## 2017-11-13 MED ORDER — OXYCODONE-ACETAMINOPHEN 10-325 MG PO TABS: 1.0000 | ORAL_TABLET | Freq: Four times a day (QID) | ORAL | 0 refills | Status: DC | PRN

## 2017-11-13 MED ORDER — SODIUM CHLORIDE 0.9 % IV BOLUS (SEPSIS)
1000.0000 mL | Freq: Once | INTRAVENOUS | Status: AC
  Administered 2017-11-13: 1000 mL via INTRAVENOUS

## 2017-11-13 MED ORDER — MORPHINE SULFATE (PF) 4 MG/ML IV SOLN
4.0000 mg | Freq: Once | INTRAVENOUS | Status: AC
  Administered 2017-11-13: 4 mg via INTRAVENOUS

## 2017-11-13 MED ORDER — ONDANSETRON HCL 4 MG/2ML IJ SOLN
4.0000 mg | Freq: Once | INTRAMUSCULAR | Status: AC
  Administered 2017-11-13: 4 mg via INTRAVENOUS
  Filled 2017-11-13: qty 2

## 2017-11-13 MED ORDER — MORPHINE SULFATE (PF) 4 MG/ML IV SOLN
INTRAVENOUS | Status: AC
  Filled 2017-11-13: qty 1

## 2017-11-13 MED ORDER — TAMSULOSIN HCL 0.4 MG PO CAPS: 0.4000 mg | ORAL_CAPSULE | Freq: Every day | ORAL | 0 refills | Status: DC

## 2017-11-13 MED ORDER — HYDROMORPHONE HCL 1 MG/ML IJ SOLN
1.0000 mg | Freq: Once | INTRAMUSCULAR | Status: AC
  Administered 2017-11-13: 1 mg via INTRAVENOUS
  Filled 2017-11-13: qty 1

## 2017-11-13 NOTE — ED Triage Notes (Signed)
Pt reports lower back pain since this morning. Pt was ambulatory from EMS bay to triage. Pt reports alcohol use last night, denies any recent injuries.

## 2017-11-13 NOTE — ED Provider Notes (Signed)
[facility] Emergency Department Provider Note  ___________________________________________   First MD Initiated Contact with Patient 11/13/17 1734     (approximate)  I have reviewed the triage vital signs and the nursing notes.   HISTORY  Chief Complaint Back Pain  HPI Miguel [name] is a 56 y.o. male who presents to the emergency department for evaluation right side back pain.  He also states that he has noticed some blood in his urine today.  Right lower back pain started this morning.  Patient has a long-standing history of alcohol abuse and states that he did drink last night but no more than usual.  He  also has a history of kidney stones and states that this feels similar in nature. He states he has not taken anything for his pain.  Past Medical History:  Diagnosis Date  . Alcohol abuse   . Asthma   . GERD (gastroesophageal reflux disease)   . Gout   . Hypertension   . OCD (obsessive compulsive disorder)   . Renal colic     Patient Active Problem List   Diagnosis Date Noted  . Suicide and self-inflicted injury by cutting and piercing instrument ([place]) 03/10/2017  . Severe recurrent major 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 ([place]) 03/09/2017  . Substance induced mood disorder ([place]) 08/15/2016  . Involuntary commitment 08/15/2016  . Alcohol use disorder, severe, dependence ([place]) 02/05/2016  . Hypertension 12/05/2015  . Tachycardia 12/05/2015  . Gout 11/13/2015  . Chronic back pain 05/02/2015    Past Surgical History:  Procedure Laterality Date  . CHOLECYSTECTOMY  2012    Prior to Admission medications   Medication Sig Start Date End Date Taking? Authorizing Provider  ADVAIR DISKUS 100-50 MCG/DOSE AEPB INHALE 1 PUFF 2 TIMES A DAY. RINSE MOUTH AND SPIT AFTER EACH USE. 10/18/17   Tawni Millers, MD  allopurinol (ZYLOPRIM) 100 MG tablet TAKE ONE TABLET BY MOUTH EVERY DAY 03/19/17   Tawni Millers, MD  allopurinol (ZYLOPRIM) 300 MG tablet Take  300 mg by mouth every morning.    [provider]  chlordiazePOXIDE (LIBRIUM) 25 MG capsule Take 1 capsule (25 mg total) by mouth 3 (three) times daily as needed for anxiety or withdrawal. 08/09/17   Darel Hong, MD  dexlansoprazole (DEXILANT) 60 MG capsule Take 60 mg by mouth every morning.    [provider]  FLUoxetine (PROZAC) 20 MG capsule Take 1 capsule (20 mg total) by mouth daily. 03/12/17   Pucilowska, Jolanta B, MD  lisinopril (PRINIVIL,ZESTRIL) 20 MG tablet Take 1 tablet (20 mg total) by mouth every morning. 03/12/17   Pucilowska, Herma Ard B, MD  omeprazole (PRILOSEC) 20 MG capsule Take 20 mg by mouth daily.    [provider]  oxyCODONE-acetaminophen (PERCOCET) 10-325 MG tablet Take 1 tablet by mouth every 6 (six) hours as needed for pain. 11/13/17 11/13/18  Chirstina Haan, Johnette Abraham B, FNP  risperiDONE (RISPERDAL) 2 MG tablet Take 1 tablet (2 mg total) by mouth 2 (two) times daily. 03/12/17   Pucilowska, Herma Ard B, MD  tamsulosin (FLOMAX) 0.4 MG CAPS capsule Take 1 capsule (0.4 mg total) by mouth daily. 11/13/17   Hughey Rittenberry, Johnette Abraham B, FNP  traZODone (DESYREL) 50 MG tablet Take 1 tablet (50 mg total) by mouth at bedtime as needed for sleep. 03/12/17   Pucilowska, Wardell Honour, MD  VENTOLIN HFA 108 (90 Base) MCG/ACT inhaler INHALE 2 PUFFS EVERY 4 HOURS AS NEEDED 10/18/17   Tawni Millers, MD    Allergies Patient  has no known allergies.  No family history on file.  Social History Social History   Tobacco Use  . Smoking status: Former Research scientist (life sciences)  . Smokeless tobacco: Never Used  Substance Use Topics  . Alcohol use: Yes    Alcohol/week: 4.2 oz    Types: 7 Glasses of wine per week    Comment: Pt states, "I quit all the hard stuff 4 months ago"  . Drug use: No    Review of Systems  Constitutional: No fever/chills Eyes: No visual changes. ENT: No sore throat. Cardiovascular: Denies chest pain. Respiratory: Denies shortness of breath. Gastrointestinal: Right lower  abdominal pain.  Positive for nausea, no vomiting.  No diarrhea.  No constipation. Genitourinary: Positive Amy the urologist to call back for dysuria. Musculoskeletal: Negative for back pain. Skin: Negative for rash. Neurological: Negative for headaches, focal weakness or numbness. ___________________________________________   PHYSICAL EXAM:  VITAL SIGNS: ED Triage Vitals  Enc Vitals Group     BP 11/13/17 1700 (!) 155/117     Pulse Rate 11/13/17 1659 (!) 101     Resp 11/13/17 1659 15     Temp 11/13/17 1659 98.2 F (36.8 C)     Temp Source 11/13/17 1659 Oral     SpO2 11/13/17 1659 98 %     Weight 11/13/17 1659 200 lb (90.7 kg)     Height 11/13/17 1659 6' (1.829 m)     Head Circumference --      Peak Flow --      Pain Score 11/13/17 1658 10     Pain Loc --      Pain Edu? --      Excl. in [place]? --     Constitutional: Alert and oriented. Uncomfortable appearing and in no acute distress. Eyes: Conjunctivae are normal. EOMI. Head: Atraumatic. Nose: No congestion/rhinnorhea. Mouth/Throat: Mucous membranes are moist.  Oropharynx non-erythematous. Neck: No stridor.   Cardiovascular: Tachycardic, regular rhythm. Grossly normal heart sounds.  Good peripheral circulation. Respiratory: Normal respiratory effort.  No retractions. Lungs CTAB. Gastrointestinal: Soft. No distention. No abdominal bruits. No CVA tenderness. Right lower quadrant is diffusely tender. Musculoskeletal: No lower extremity tenderness nor edema.  No joint effusions. CVA tenderness on the right. Neurologic:  Normal speech and language. No gross focal neurologic deficits are appreciated. No gait instability. Skin:  Skin is warm, dry and intact. No rash noted. Psychiatric: Mood and affect are normal. Speech and behavior are normal.  ____________________________________________   LABS (all labs ordered are listed, but only abnormal results are displayed)  Labs Reviewed  URINALYSIS, COMPLETE (UACMP) WITH MICROSCOPIC  - Abnormal; Notable for the following components:      Result Value   Color, Urine YELLOW (*)    APPearance HAZY (*)    Hgb urine dipstick LARGE (*)    Protein, ur 30 (*)    Leukocytes, UA TRACE (*)    All other components within normal limits  CBC WITH DIFFERENTIAL/PLATELET - Abnormal; Notable for the following components:   RDW 16.0 (*)    All other components within normal limits  BASIC METABOLIC PANEL - Abnormal; Notable for the following components:   Potassium 3.3 (*)    CO2 21 (*)    Glucose, Bld 132 (*)    All other components within normal limits  HEPATIC FUNCTION PANEL - Abnormal; Notable for the following components:   ALT 13 (*)    All other components within normal limits   ____________________________________________  EKG  Not indicated. ____________________________________________  RADIOLOGY  Ct Renal Stone Study  Result Date: 11/13/2017 CLINICAL DATA:  Low back pain with hematuria EXAM: CT ABDOMEN AND PELVIS WITHOUT CONTRAST TECHNIQUE: Multidetector CT imaging of the abdomen and pelvis was performed following the standard protocol without IV contrast. COMPARISON:  08/09/2017 FINDINGS: Lower chest: Lingular atelectatic changes are noted. Hepatobiliary: No focal liver abnormality is seen. Status post cholecystectomy. No biliary dilatation. Pancreas: Unremarkable. No pancreatic ductal dilatation or surrounding inflammatory changes. Spleen: Normal in size without focal abnormality. Adrenals/Urinary Tract: The adrenal glands are within normal limits. Nonobstructing left renal stones are again identified and stable. The left ureter is within normal limits. The bladder is decompressed. The right kidney also demonstrates nonobstructing renal stones. Mild hydronephrosis is noted on the right extending into the right ureter to the level of the distal ureter just above the ureterovesical junction. 5 mm obstructing stone is noted in the distal right ureter. This is best visualized  on image number 89 of series 5 and image number 78 of series 2. Stomach/Bowel: The appendix is within normal limits. The colon is again decompressed with a similar appearance to that noted on the prior exam likely within normal limits given the long-term stability. No obstructive changes are seen. Diverticular changes noted without diverticulitis. Vascular/Lymphatic: Aortic atherosclerosis. No enlarged abdominal or pelvic lymph nodes. Reproductive: Prostate is unremarkable. Other: No abdominal wall hernia or abnormality. No abdominopelvic ascites. Musculoskeletal: No acute or significant osseous findings. IMPRESSION: 5 mm distal right ureteral stone with obstructive change. Bilateral nonobstructing stones similar to that seen on the prior exam. Mild left lingular atelectasis. Electronically Signed   By: Inez Catalina M.D.   On: 11/13/2017 19:36    ____________________________________________   PROCEDURES  Procedure(s) performed: None  Procedures  Critical Care performed: No  ____________________________________________   INITIAL IMPRESSION / ASSESSMENT AND PLAN / ED COURSE  As part of my medical decision making, I reviewed the following data within the [facility] A consult was requested and obtained from this/these consultant(s) Urology, Notes from prior ED visits and  Controlled Substance Database.  56 year old male who is well-known to the emergency department is here complaining of right flank pain.  Urinalysis has been requested.  Patient states that he is unable to urinate at this time.  He was advised that he will need to provide the urine specimen to reach a diagnosis. ----------------------------------------- 8:14 PM on 11/13/2017 -----------------------------------------  Urologist consult requested. Awaiting return call at this time. Patient reports having eaten or drinking last at 10am.  ----------------------------------------- 8:42 PM on  11/13/2017 ----------------------------------------- Urology specialist, Dr. Noah Delaine advises to provide pain control and as long as the patient remains afebrile discharge him home with Percocet and have him follow-up in the office.  Results and plan was discussed with the patient who is agreeable.  Patient was advised that he is to call and schedule an appointment with urology.  He is to return to the emergency department for symptoms of change or worsen if he is unable to schedule an appointment. ____________________________________________   FINAL CLINICAL IMPRESSION(S) / ED DIAGNOSES  Final diagnoses:  Ureterolithiasis     ED Discharge Orders        Ordered    oxyCODONE-acetaminophen (PERCOCET) 10-325 MG tablet  Every 6 hours PRN     11/13/17 2041    tamsulosin (FLOMAX) 0.4 MG CAPS capsule  Daily     11/13/17 2041       Note:  This document was prepared using Dragon voice recognition software and  may include unintentional dictation errors.Marland Kitchenar    Victorino Dike, FNP 11/13/17 2324    [name] Amor, MD 11/14/17 [phone]

## 2017-11-13 NOTE — Discharge Instructions (Signed)
Return to the ER if you develop a fever or if your pain gets worse even after taking your pain medication.

## 2017-11-13 NOTE — ED Notes (Signed)
Pt taken in wheelchair to waiting room to call cab for ride

## 2017-11-22 ENCOUNTER — Other Ambulatory Visit: Payer: Self-pay | Admitting: Internal Medicine

## 2017-11-22 IMAGING — CR DG KNEE 1-2V*L*
1 series · 2 of 2 positions shown · non-contrast
Comparison: None.

CLINICAL DATA: Recent fall.  Left knee pain and swelling.

EXAM:
LEFT KNEE - 1-2 VIEW

[Series 1: dg knee 1-2 views left · 0.14mm/px · 2 of 2 slices shown]
[im 1/2]
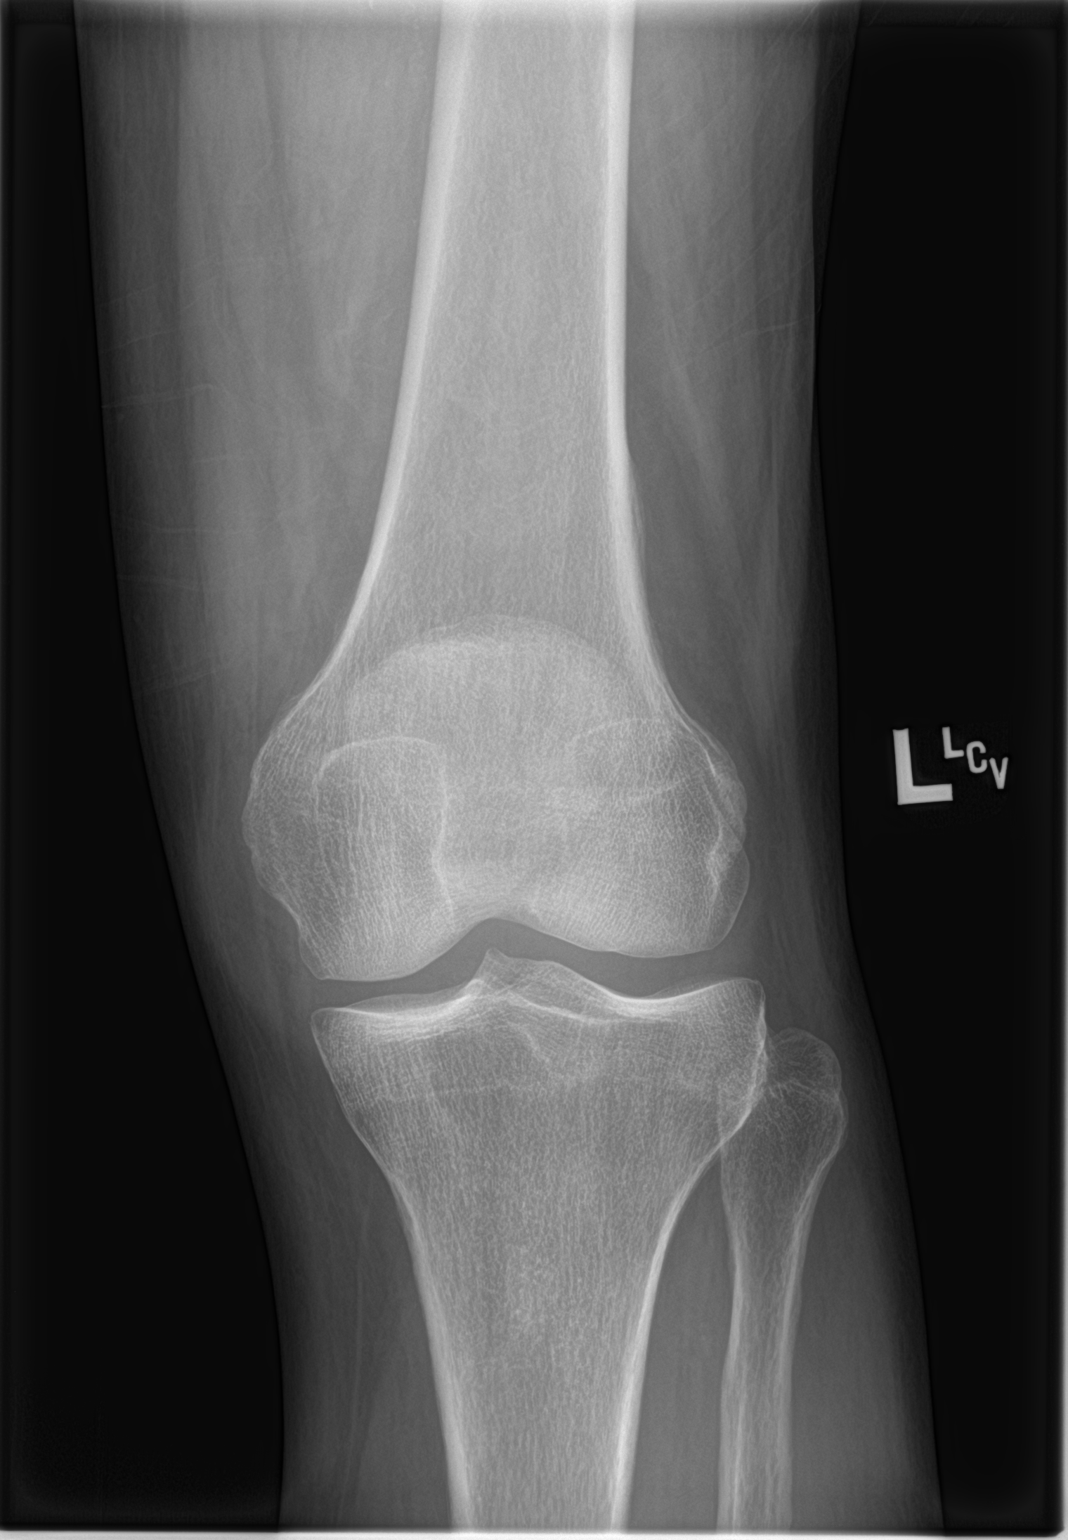
[im 2/2]
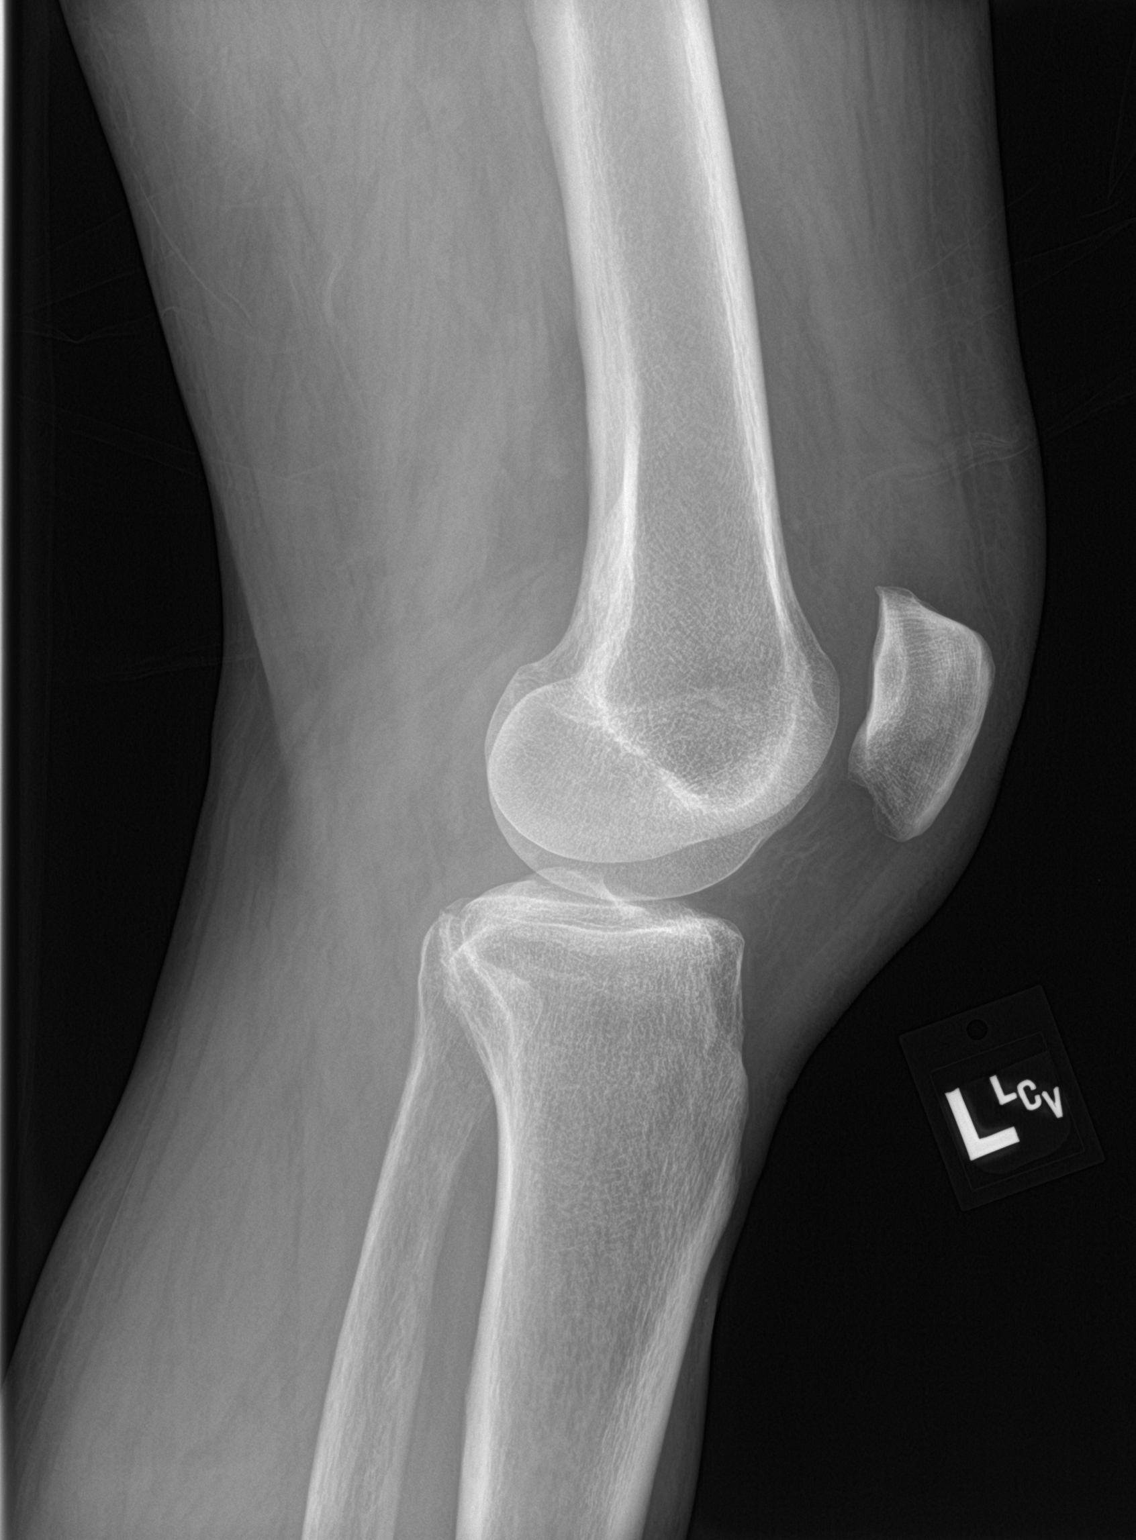

[2 of 2 positions shown; findings below may reference images not displayed]

FINDINGS: No evidence of fracture, dislocation, or joint effusion. No evidence
of arthropathy or other focal bone abnormality. Soft tissues are
unremarkable.
IMPRESSION: No left knee fracture, joint effusion or malalignment.

## 2017-11-23 ENCOUNTER — Telehealth: Payer: Self-pay | Admitting: Pharmacist

## 2017-11-23 NOTE — Telephone Encounter (Signed)
11/23/17 Mailing denial letter to patient for Ventolin HFA & Advair 100/50 due to patient has not signed and returned paperwork mailed to him 09/15/17 for [name] to order these meds.Delos Haring

## 2017-11-24 ENCOUNTER — Other Ambulatory Visit: Payer: Self-pay | Admitting: Internal Medicine

## 2017-11-26 ENCOUNTER — Telehealth: Payer: Self-pay | Admitting: Pharmacist

## 2017-11-26 NOTE — Telephone Encounter (Signed)
11/26/17 I have received the provider portion of [employer] application for renewal-Dexilant [employer]-holding for patient to return his portion. I mailed to patient 11/12/17.Delos Haring

## 2017-11-27 IMAGING — CT CT CERVICAL SPINE W/O CM
3 series · 14 of 33 positions shown, 17 images · non-contrast
Comparison: CT of the head and cervical spine performed 01/26/2017

CLINICAL DATA: Status post multiple falls, with dizziness. Initial
encounter.

EXAM:
CT HEAD WITHOUT CONTRAST
CT CERVICAL SPINE WITHOUT CONTRAST
TECHNIQUE: Multidetector CT imaging of the head and cervical spine was
performed following the standard protocol without intravenous
contrast. Multiplanar CT image reconstructions of the cervical spine
were also generated.

[Series 3: ax head wo · axial · 0.44mm/px · z∈[-125,-17]mm · 6 of 30 slices shown, 8 images]
[im 5/30  soft-tissue]
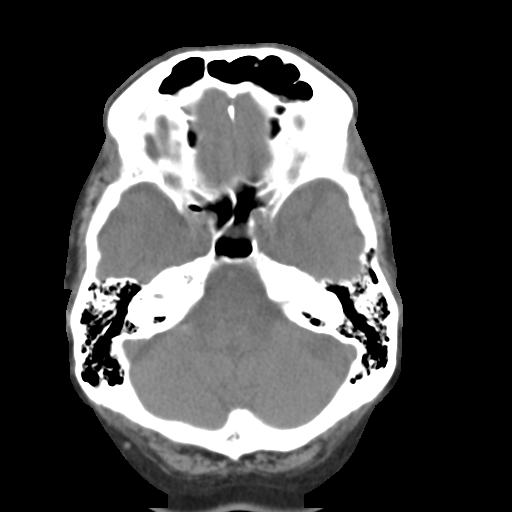
[im 5/30  bone]
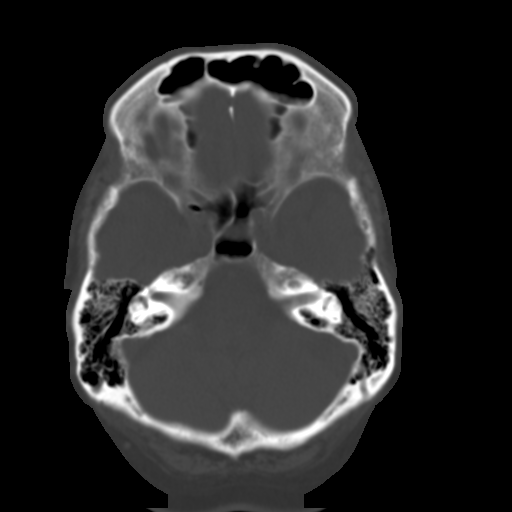
[im 9/30  bone]
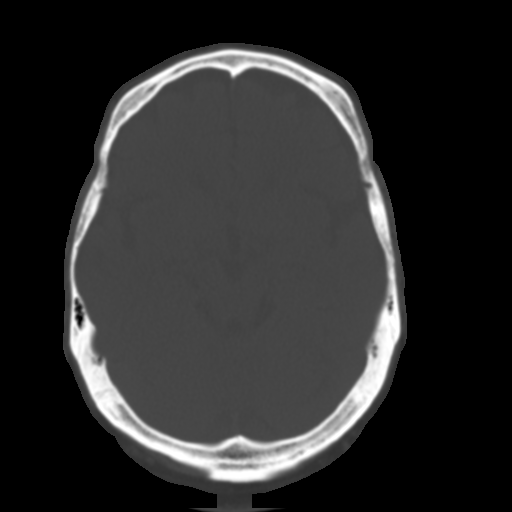
[im 14/30  bone]
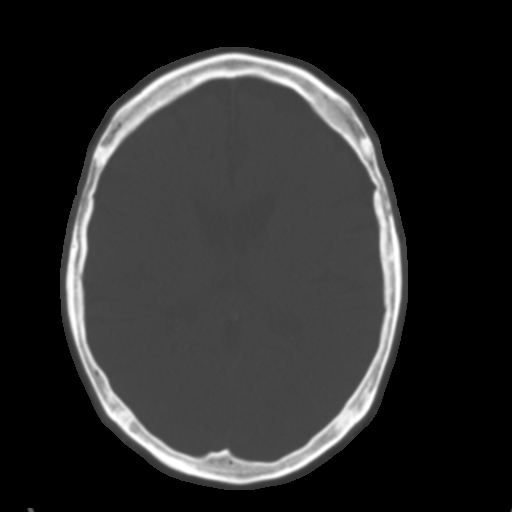
[im 18/30  bone]
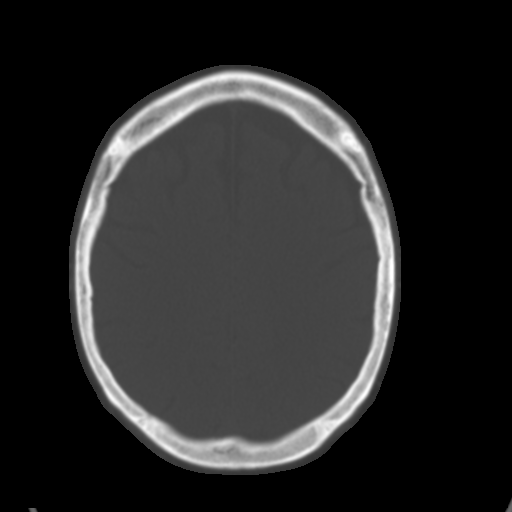
[im 23/30  soft-tissue]
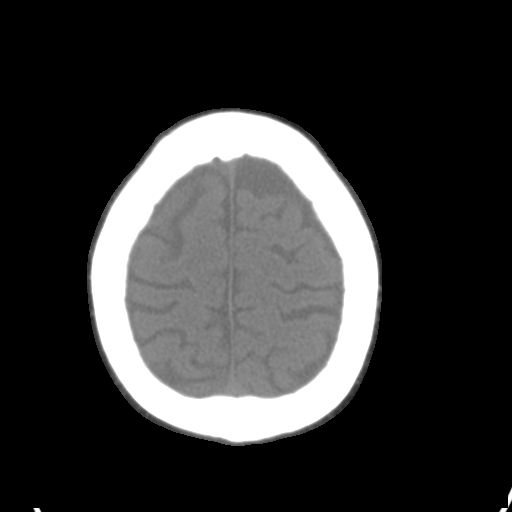
[im 23/30  bone]
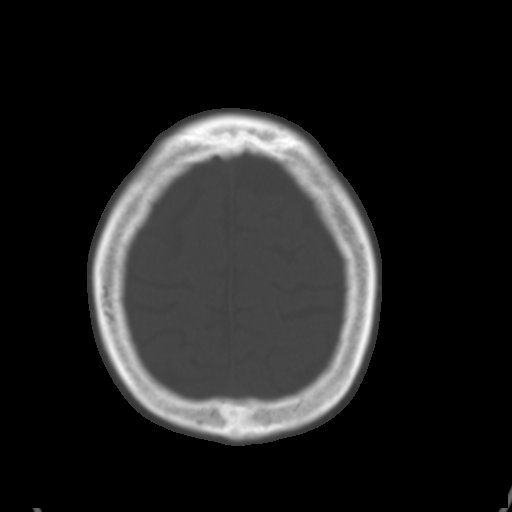
[im 27/30  bone]
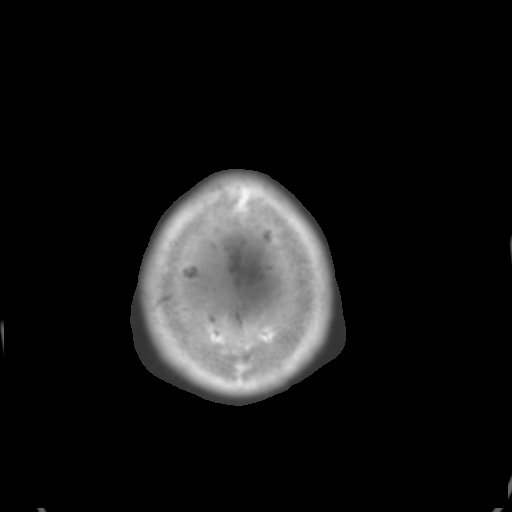

[Series 5: coronal soft tissue · coronal · 0.31mm/px · 3 of 64 slices shown]
[im 13/64  bone]
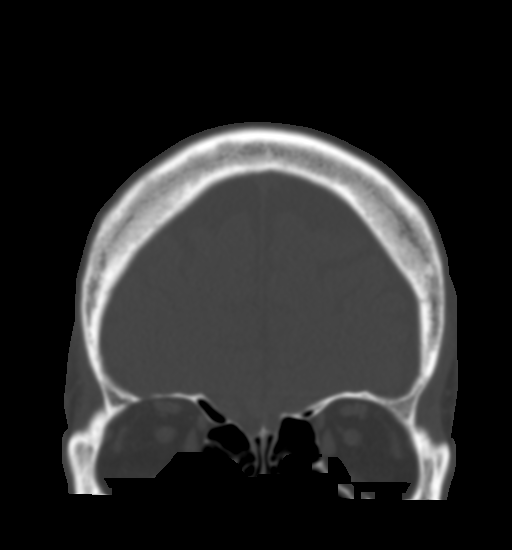
[im 26/64  bone]
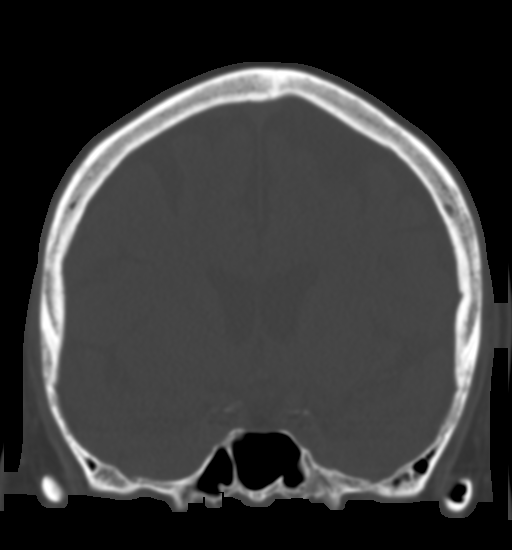
[im 38/64  bone]
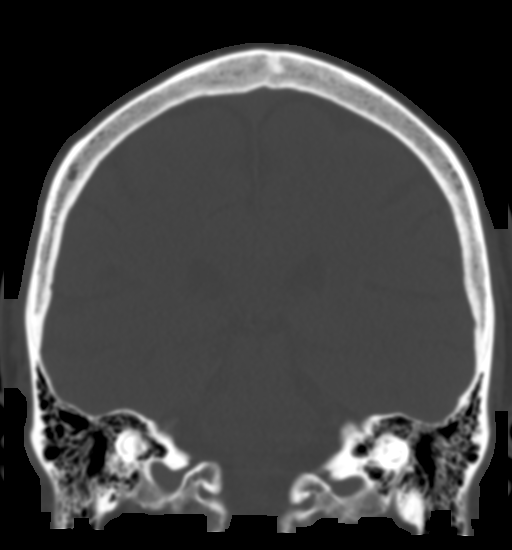

[Series 6: sagittal soft tissue · sagittal · 0.33mm/px · 5 of 52 slices shown, 6 images]
[im 18/52  bone]
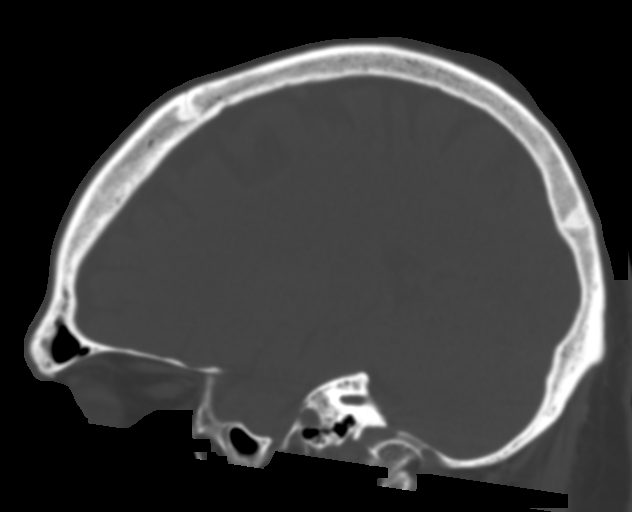
[im 22/52  bone]
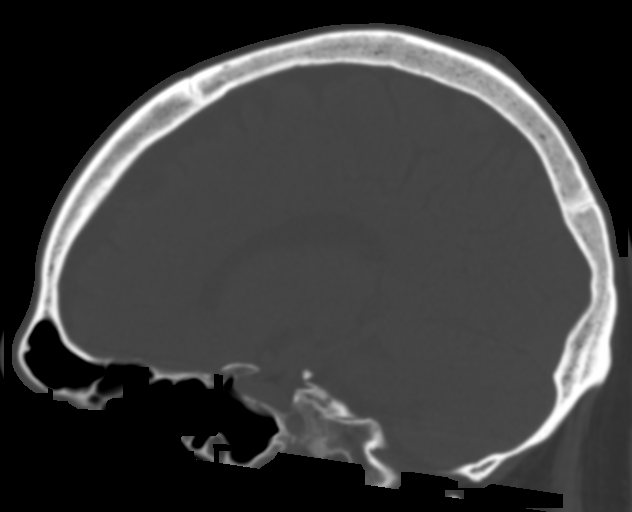
[im 26/52  soft-tissue]
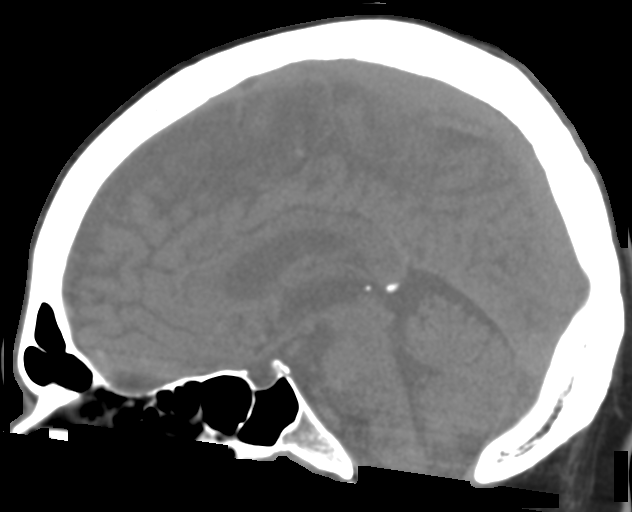
[im 26/52  bone]
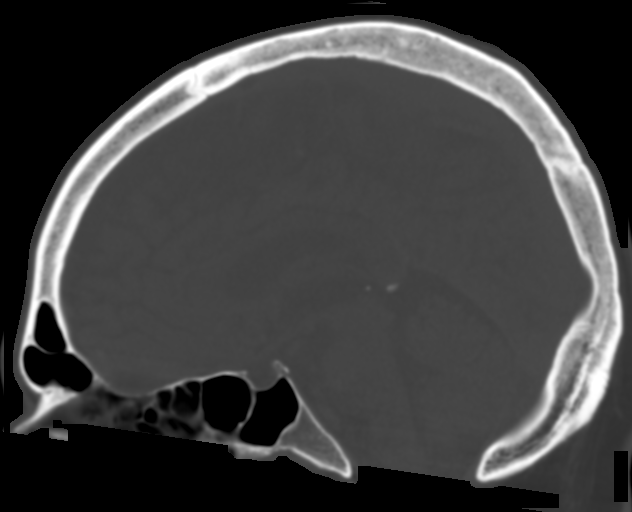
[im 30/52  bone]
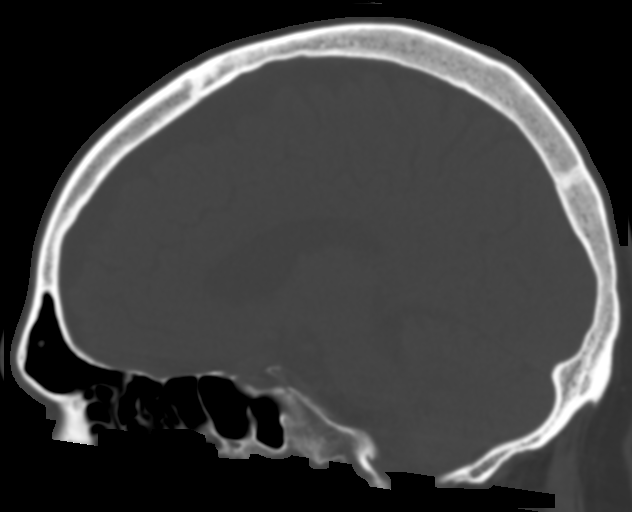
[im 35/52  bone]
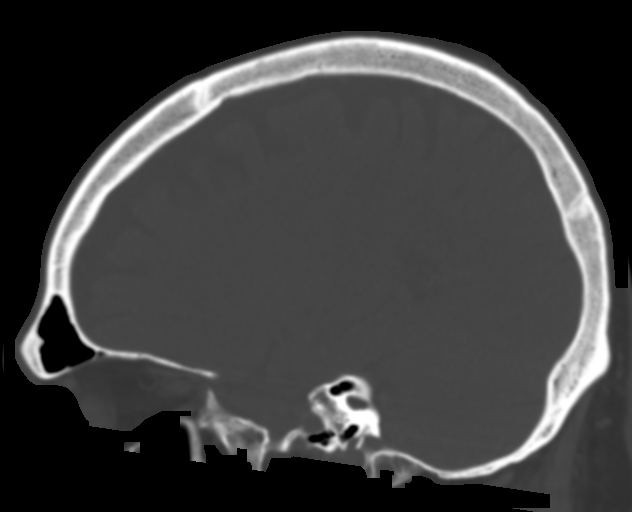

[14 of 33 positions shown; findings below may reference images not displayed]

FINDINGS: CT HEAD FINDINGS

Brain: No evidence of acute infarction, hemorrhage, hydrocephalus,
extra-axial collection or mass lesion/mass effect.

Prominence of the ventricles and sulci reflects mild cortical volume
loss. Mild periventricular white matter change likely reflects small
vessel ischemic microangiopathy.

The brainstem and fourth ventricle are within normal limits. The
basal ganglia are unremarkable in appearance. The cerebral
hemispheres demonstrate grossly normal gray-white differentiation.
No mass effect or midline shift is seen.

Vascular: No hyperdense vessel or unexpected calcification.

Skull: There is no evidence of fracture; visualized osseous
structures are unremarkable in appearance caps.

Sinuses/Orbits: The visualized portions of the orbits are within
normal limits. The paranasal sinuses and mastoid air cells are
well-aerated.

Other: No significant soft tissue abnormalities are seen.

CT CERVICAL SPINE FINDINGS

Alignment: Normal.

Skull base and vertebrae: No acute fracture. No primary bone lesion
or focal pathologic process.

Soft tissues and spinal canal: No prevertebral fluid or swelling. No
visible canal hematoma.

Disc levels: Small anterior disc osteophyte complexes are noted
along the cervical spine. Intervertebral disc spaces are preserved.

Upper chest: Mild calcification is noted at the carotid bifurcations
bilaterally. The visualized portions of the thyroid gland are
unremarkable. The visualized lung apices are clear.

Other: Degenerative cystic change is noted at the right side of the
mandible.
IMPRESSION: 1. No evidence of traumatic intracranial injury or fracture.
2. No evidence of fracture or subluxation along the cervical spine.
3. Mild cortical volume loss and scattered small vessel ischemic
microangiopathy.
4. Mild calcification at the carotid bifurcations bilaterally.
Carotid ultrasound could be considered for further evaluation, when
and as deemed clinically appropriate.
5. Degenerative cystic change at the right side of the mandible.

## 2017-11-27 IMAGING — CT CT CERVICAL SPINE W/O CM
3 of 4 series · 9 of 33 positions shown, 11 images · non-contrast
Comparison: CT of the head and cervical spine performed 01/26/2017

CLINICAL DATA: Status post multiple falls, with dizziness. Initial
encounter.

EXAM:
CT HEAD WITHOUT CONTRAST
CT CERVICAL SPINE WITHOUT CONTRAST
TECHNIQUE: Multidetector CT imaging of the head and cervical spine was
performed following the standard protocol without intravenous
contrast. Multiplanar CT image reconstructions of the cervical spine
were also generated.

[Series 4: sagittal bone · sagittal · 0.24mm/px · 5 of 46 slices shown, 6 images]
[im 16/46  bone]
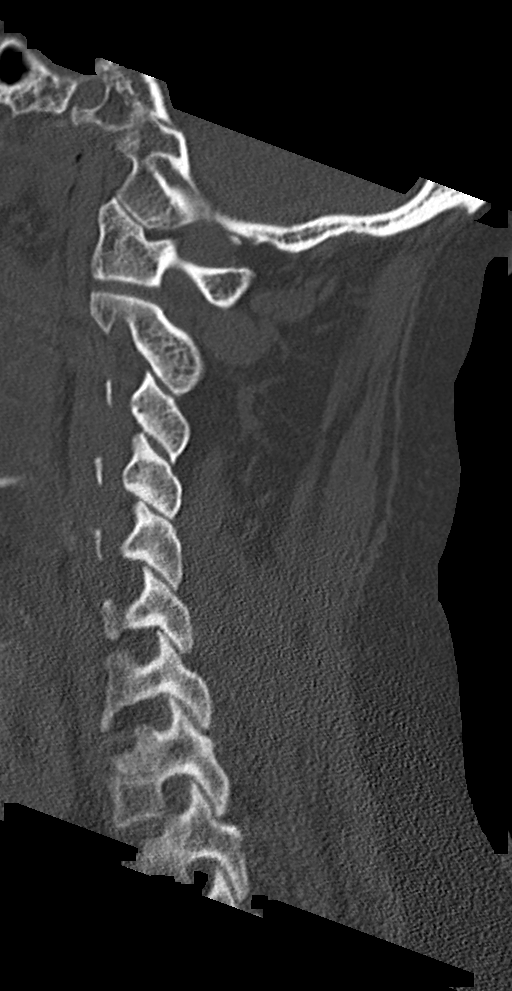
[im 19/46  bone]
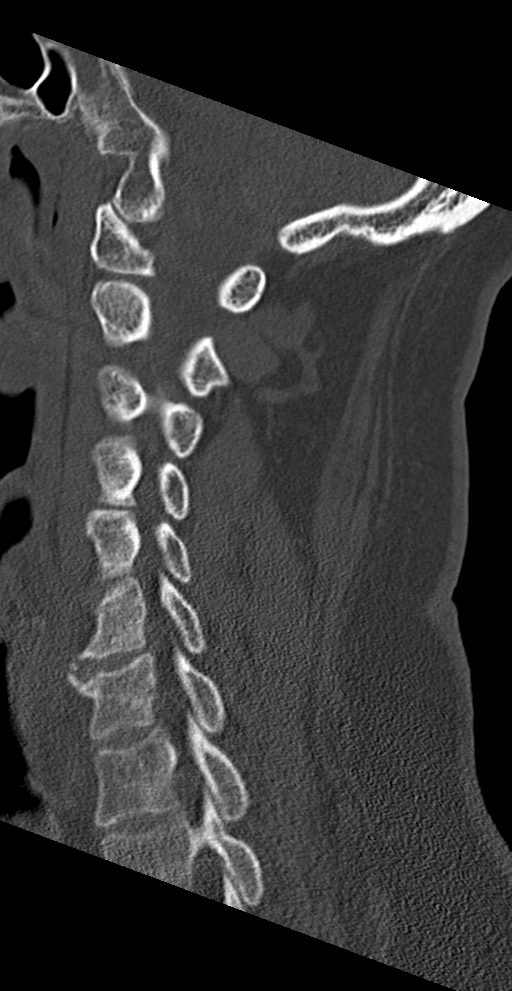
[im 23/46  soft-tissue]
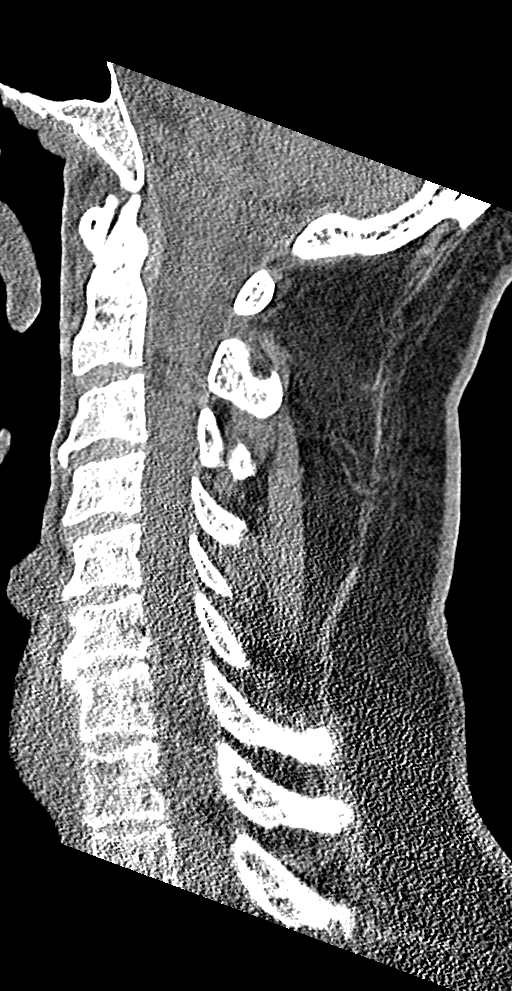
[im 23/46  bone]
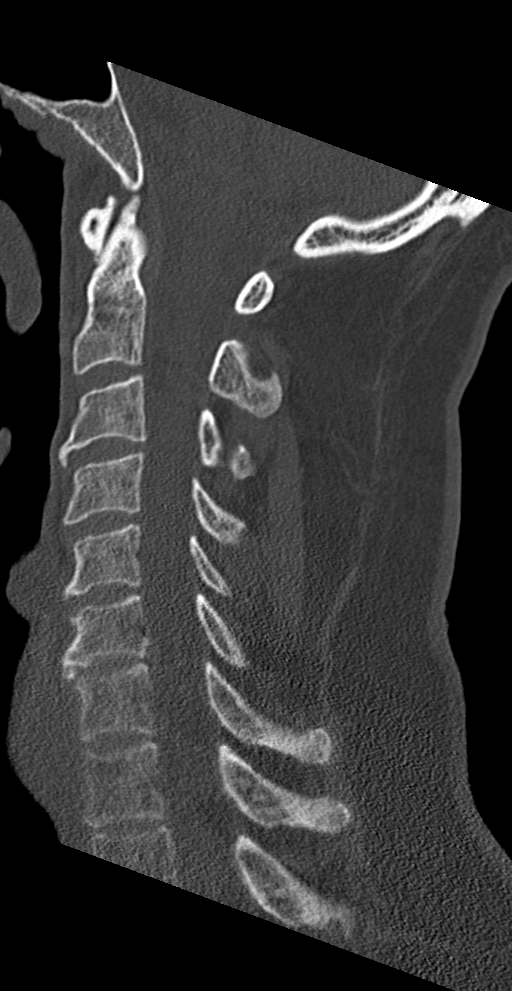
[im 27/46  bone]
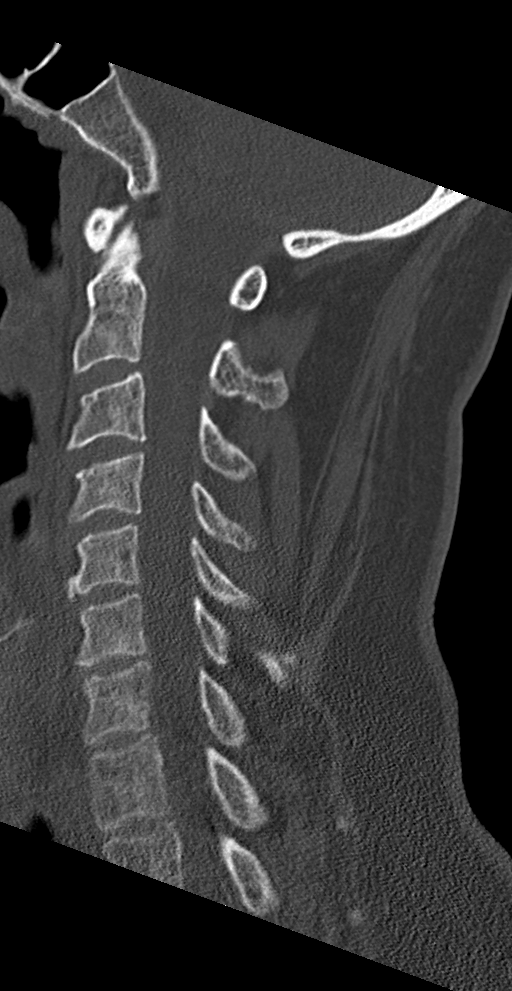
[im 31/46  bone]
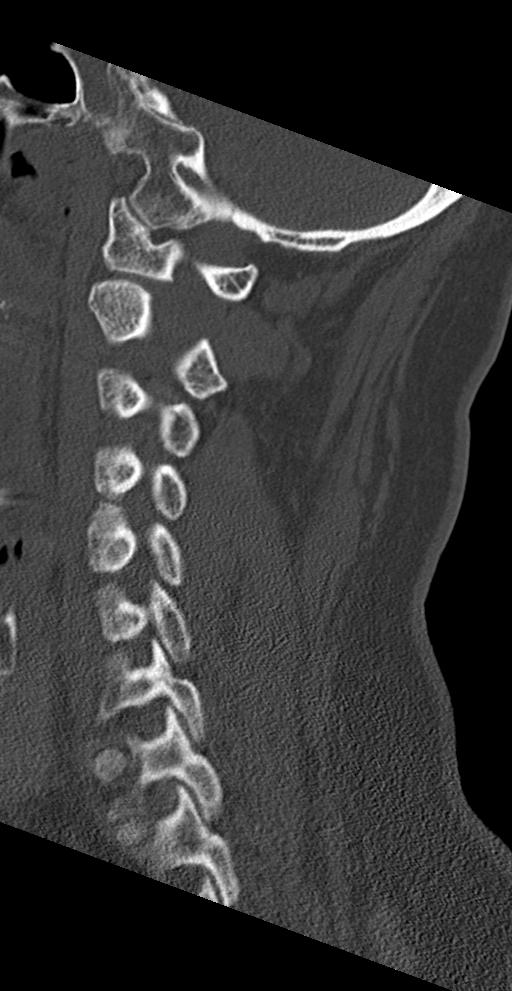

[Series 5: coronal bone · coronal · 0.27mm/px · 3 of 40 slices shown]
[im 8/40  bone]
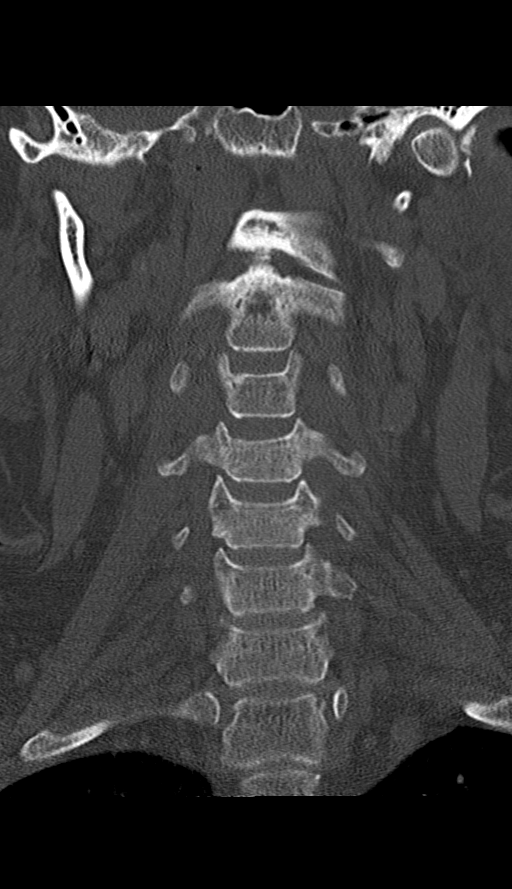
[im 16/40  bone]
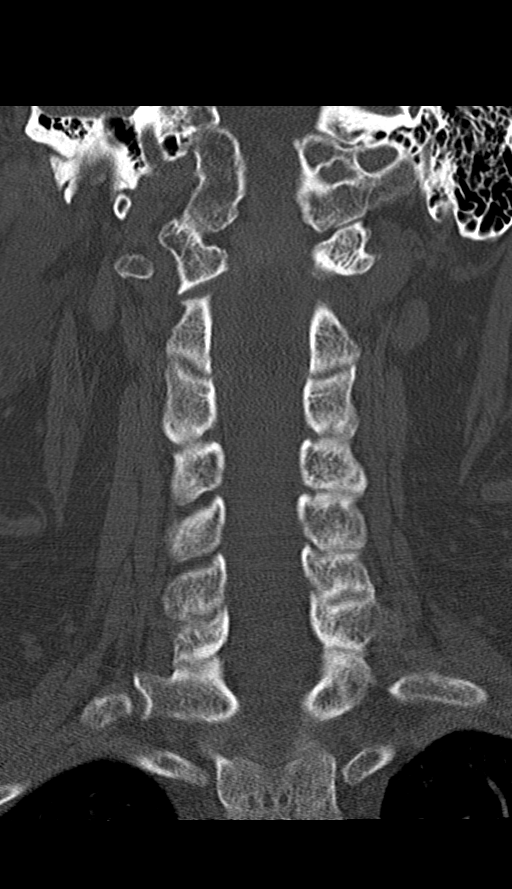
[im 24/40  bone]
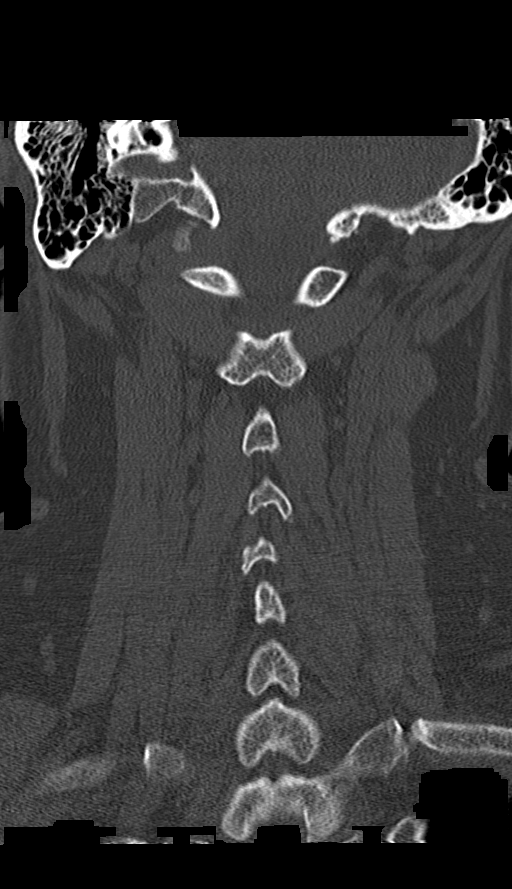

[Series 6: orthogonal bone · axial · 0.23mm/px · z∈[-255,-255]mm · 1 of 96 slices shown, 2 images]
[im 48/96  soft-tissue]
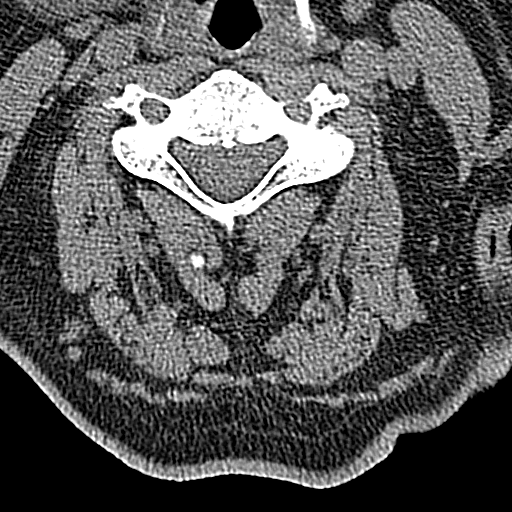
[im 48/96  bone]
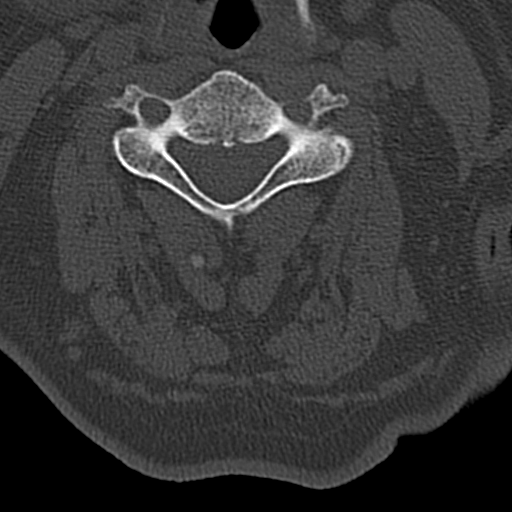

[9 of 33 positions shown; findings below may reference images not displayed]

FINDINGS: CT HEAD FINDINGS

Brain: No evidence of acute infarction, hemorrhage, hydrocephalus,
extra-axial collection or mass lesion/mass effect.

Prominence of the ventricles and sulci reflects mild cortical volume
loss. Mild periventricular white matter change likely reflects small
vessel ischemic microangiopathy.

The brainstem and fourth ventricle are within normal limits. The
basal ganglia are unremarkable in appearance. The cerebral
hemispheres demonstrate grossly normal gray-white differentiation.
No mass effect or midline shift is seen.

Vascular: No hyperdense vessel or unexpected calcification.

Skull: There is no evidence of fracture; visualized osseous
structures are unremarkable in appearance caps.

Sinuses/Orbits: The visualized portions of the orbits are within
normal limits. The paranasal sinuses and mastoid air cells are
well-aerated.

Other: No significant soft tissue abnormalities are seen.

CT CERVICAL SPINE FINDINGS

Alignment: Normal.

Skull base and vertebrae: No acute fracture. No primary bone lesion
or focal pathologic process.

Soft tissues and spinal canal: No prevertebral fluid or swelling. No
visible canal hematoma.

Disc levels: Small anterior disc osteophyte complexes are noted
along the cervical spine. Intervertebral disc spaces are preserved.

Upper chest: Mild calcification is noted at the carotid bifurcations
bilaterally. The visualized portions of the thyroid gland are
unremarkable. The visualized lung apices are clear.

Other: Degenerative cystic change is noted at the right side of the
mandible.
IMPRESSION: 1. No evidence of traumatic intracranial injury or fracture.
2. No evidence of fracture or subluxation along the cervical spine.
3. Mild cortical volume loss and scattered small vessel ischemic
microangiopathy.
4. Mild calcification at the carotid bifurcations bilaterally.
Carotid ultrasound could be considered for further evaluation, when
and as deemed clinically appropriate.
5. Degenerative cystic change at the right side of the mandible.

## 2017-11-29 IMAGING — CR DG CHEST 2V
2 series · 2 of 2 positions shown · non-contrast
Comparison: 01/07/2017

CLINICAL DATA: Fall

EXAM:
CHEST  2 VIEW

[chest lat]
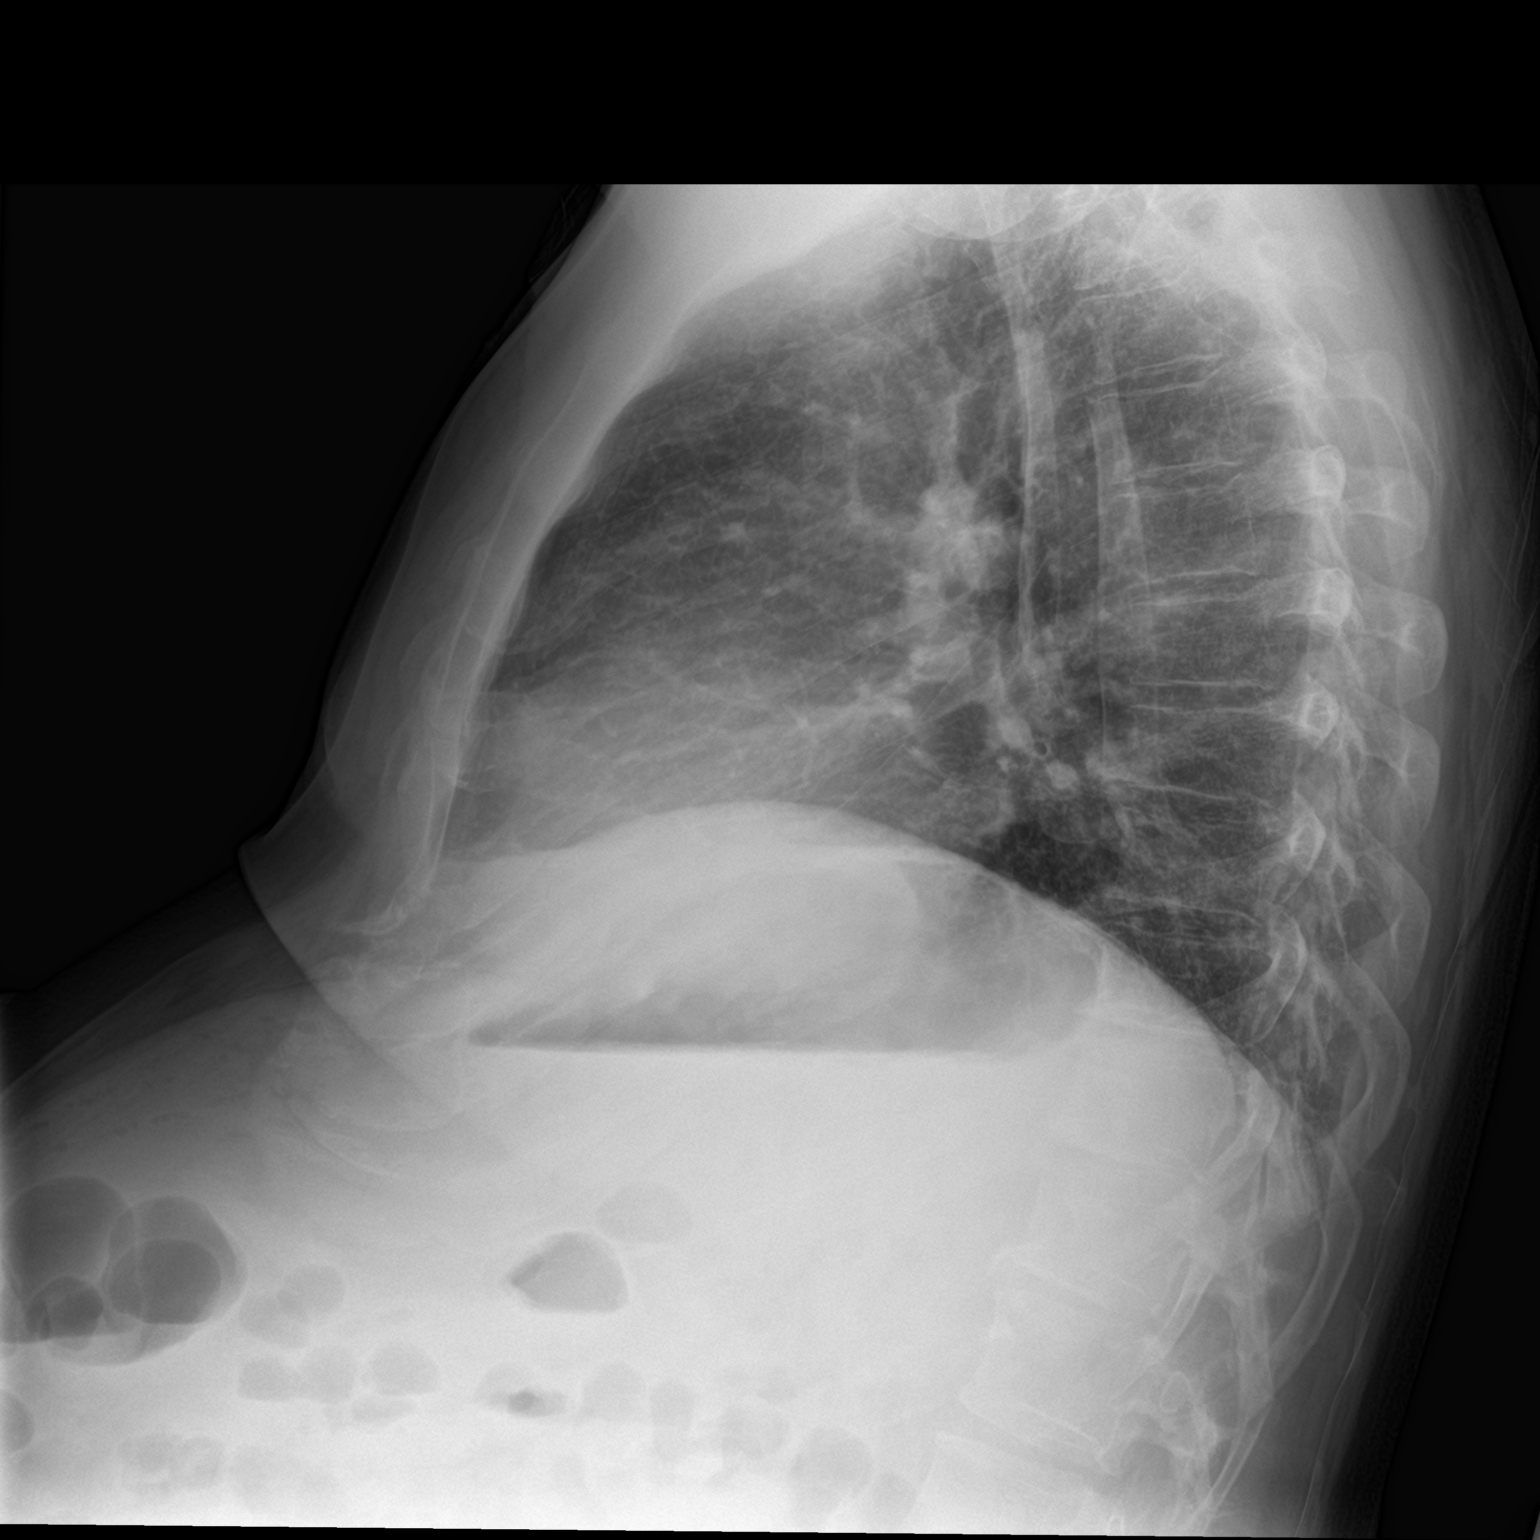

[chest ap]
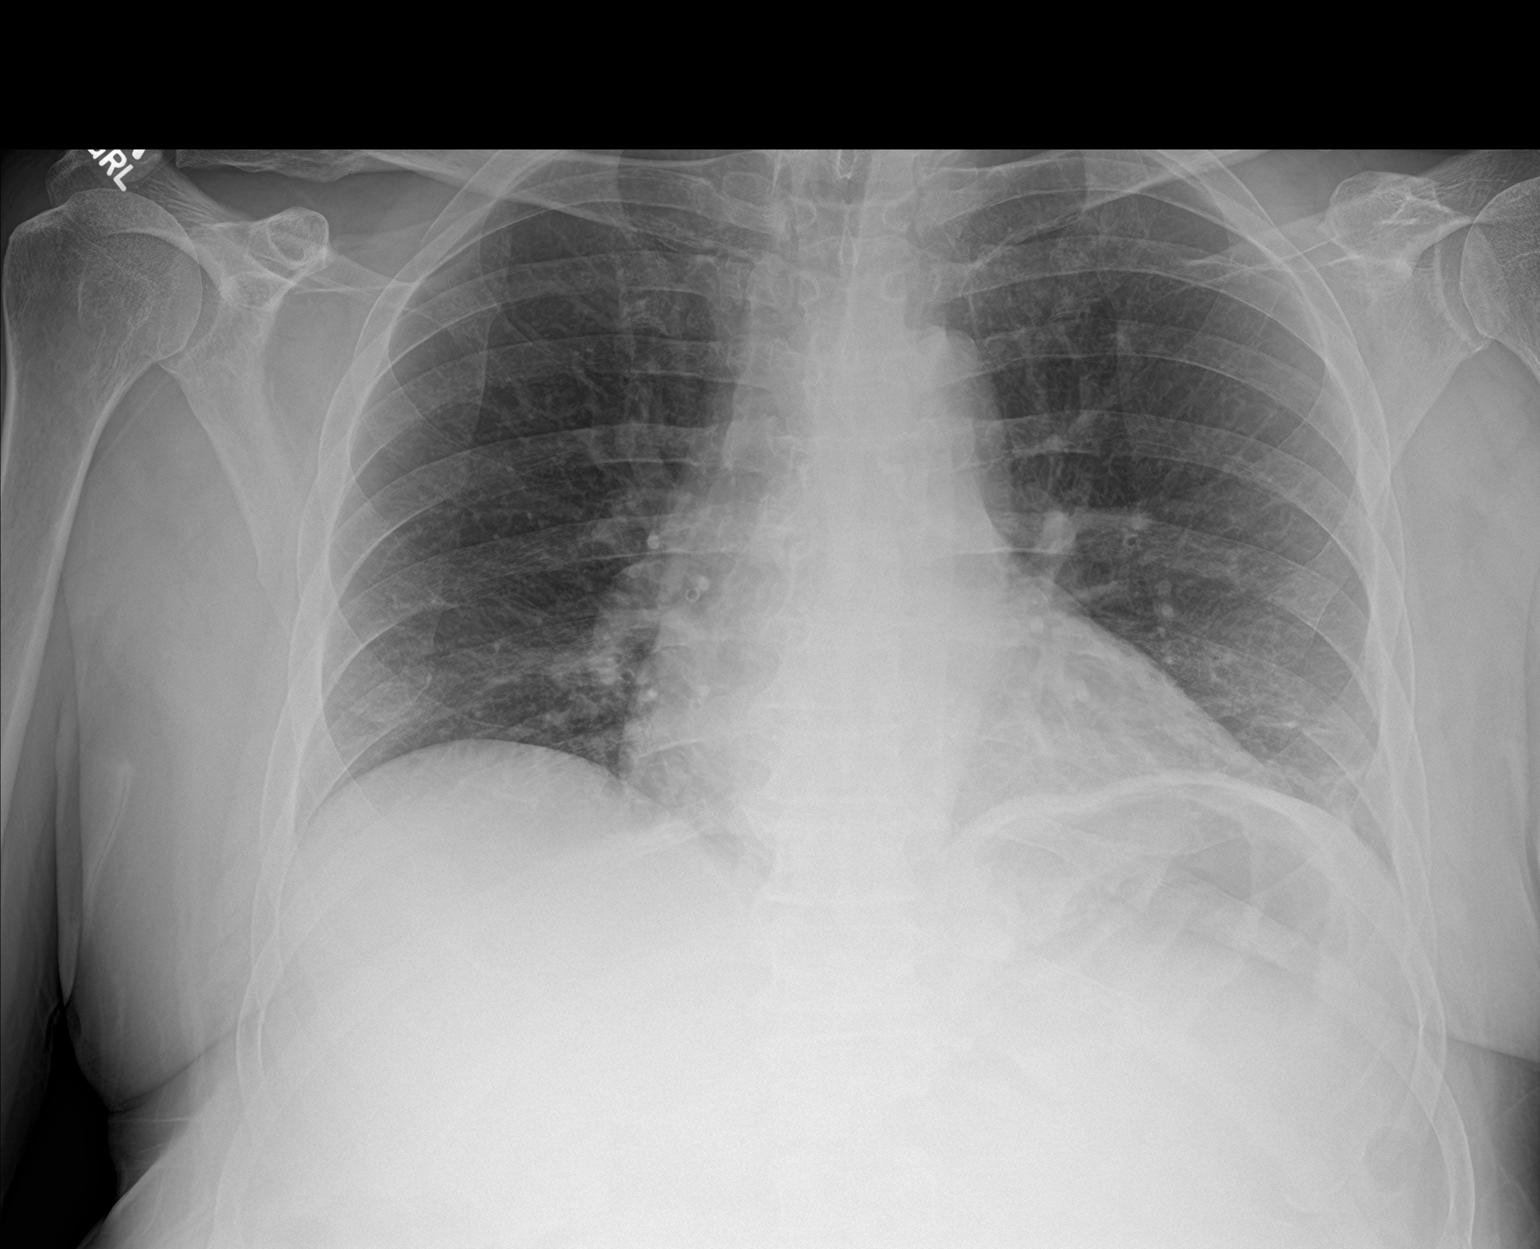

[2 of 2 positions shown; findings below may reference images not displayed]

FINDINGS: Mildly low lung volume with atelectasis at the left base. No acute
consolidation or effusion. Borderline cardiomegaly augmented by low
lung volume. No pneumothorax.
IMPRESSION: Low lung volumes with linear atelectasis at the left lung base.

## 2017-11-29 IMAGING — CT CT HEAD W/O CM
4 of 8 series · 16 of 47 positions shown, 17 images · non-contrast
Comparison: [DATE]

CLINICAL DATA: Fall

Pt presents to ED via EMS c/o fall r/t taking lisinopril and wine .
Pt states that after he was drinking alcohol , he fell 2x. Now c/o
neck pain
EXAM:
CT HEAD WITHOUT CONTRAST
CT CERVICAL SPINE WITHOUT CONTRAST
TECHNIQUE: Multidetector CT imaging of the head and cervical spine was
performed following the standard protocol without intravenous
contrast. Multiplanar CT image reconstructions of the cervical spine
were also generated.

[Series 2: head wo · axial · 0.46mm/px · z∈[+385,+545]mm · 3 of 33 slices shown, 4 images]
[im 1/33  brain]
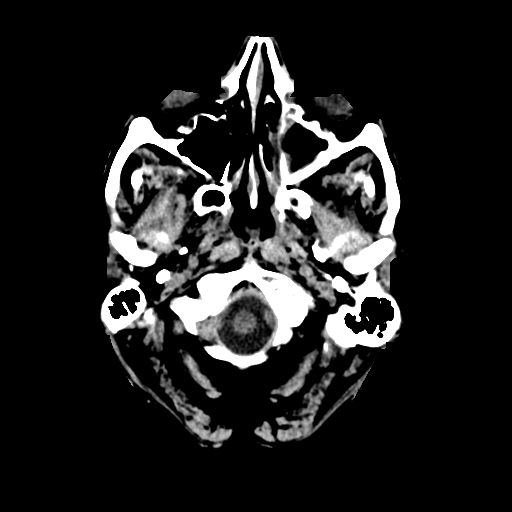
[im 1/33  bone]
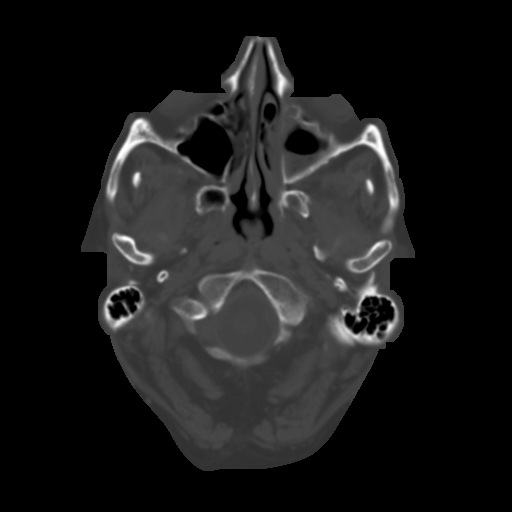
[im 17/33  brain]
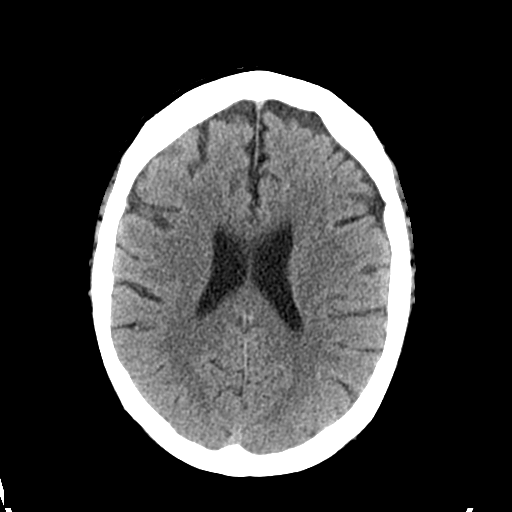
[im 33/33  brain]
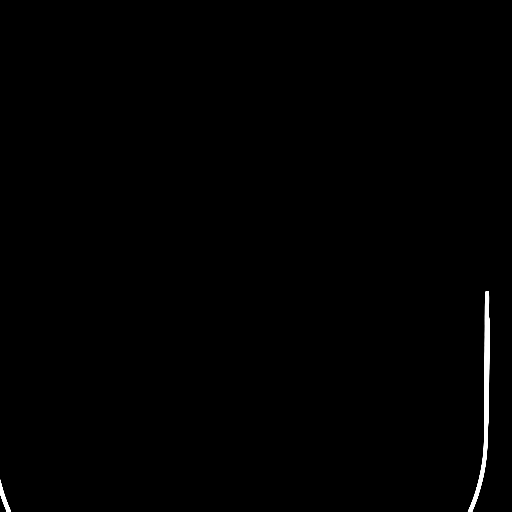

[Series 4: coronal soft tissue · coronal · 0.32mm/px · 3 of 66 slices shown]
[im 14/66  brain]
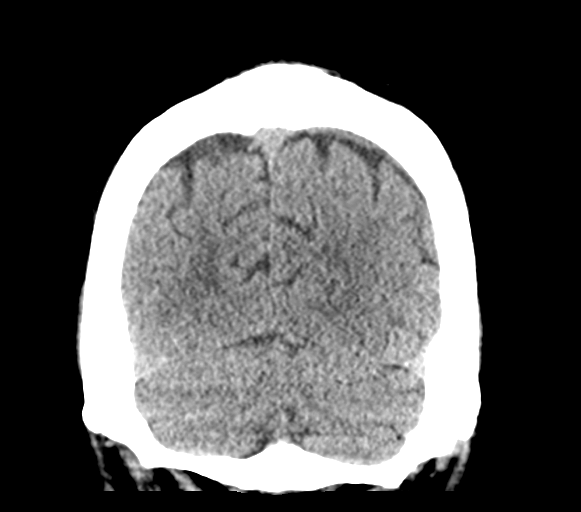
[im 27/66  brain]
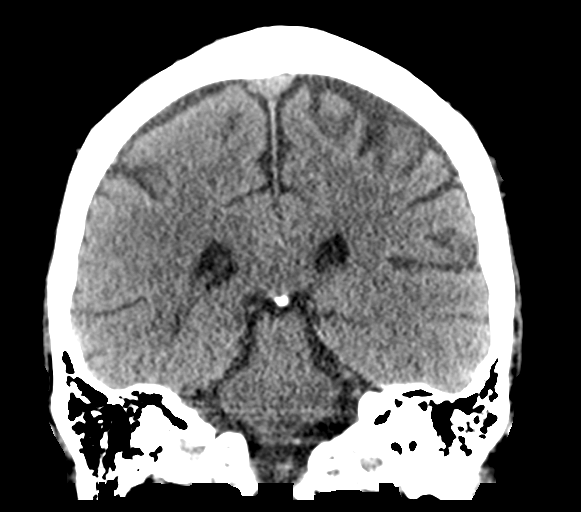
[im 40/66  brain]
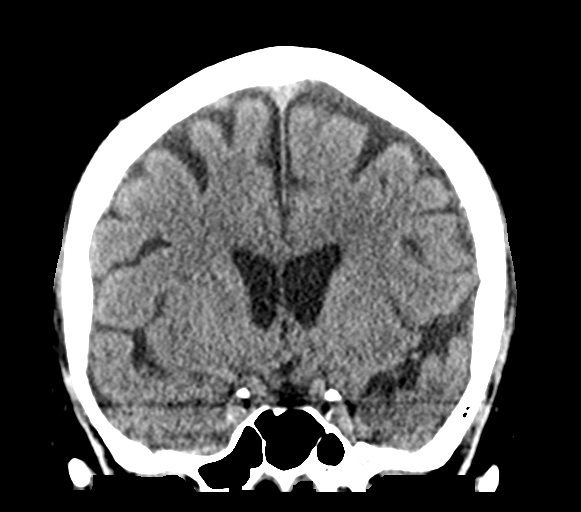

[Series 5: sagittal soft tissue · sagittal · 0.32mm/px · 2 of 53 slices shown]
[im 18/53  brain]
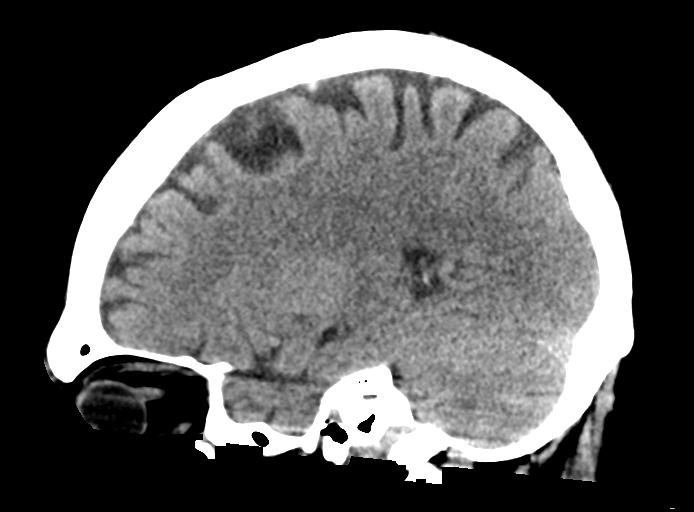
[im 35/53  brain]
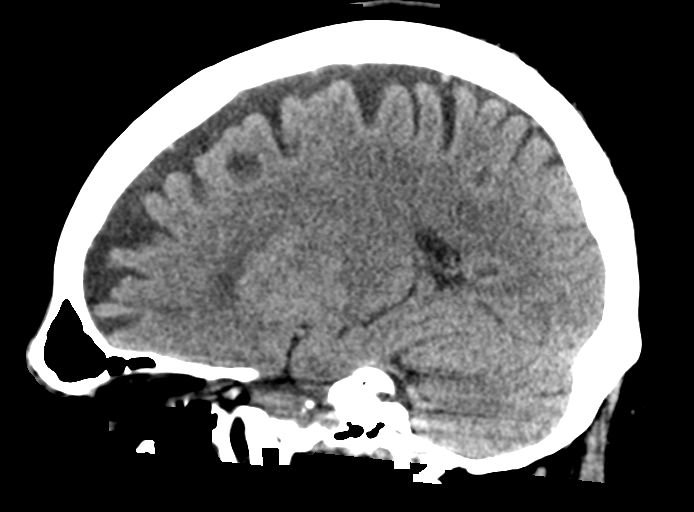

[Series 12: orthogonal bone · axial · 0.30mm/px · z∈[+176,+323]mm · 8 of 106 slices shown]
[im 12/106  bone]
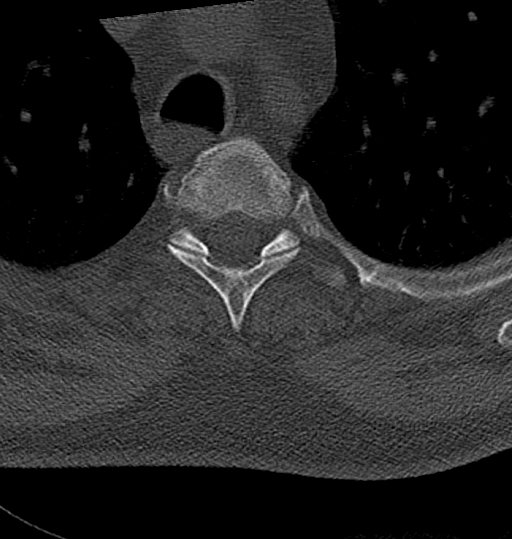
[im 24/106  bone]
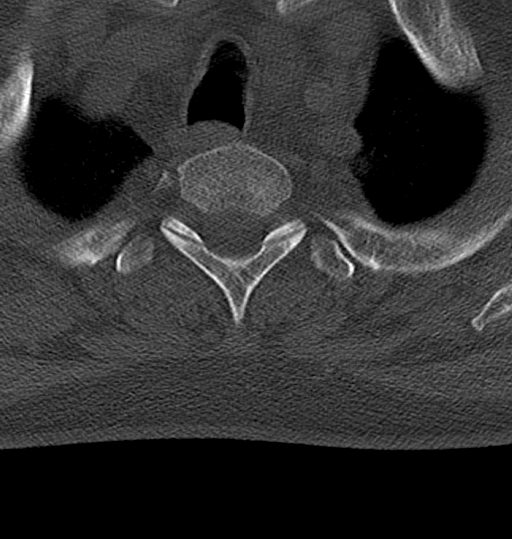
[im 36/106  bone]
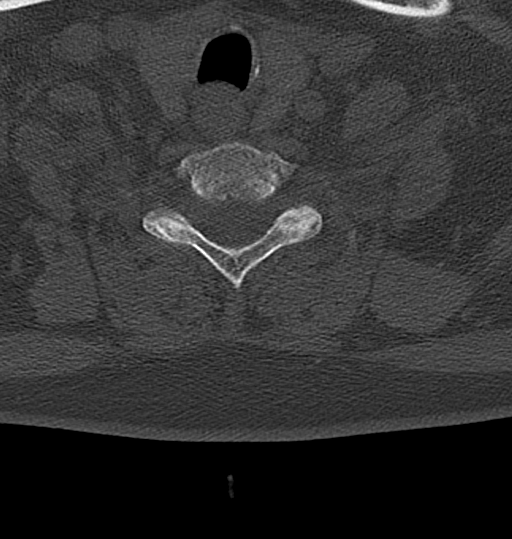
[im 47/106  bone]
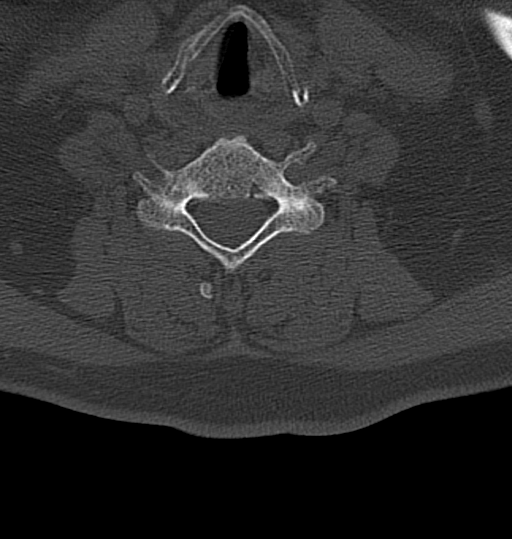
[im 59/106  bone]
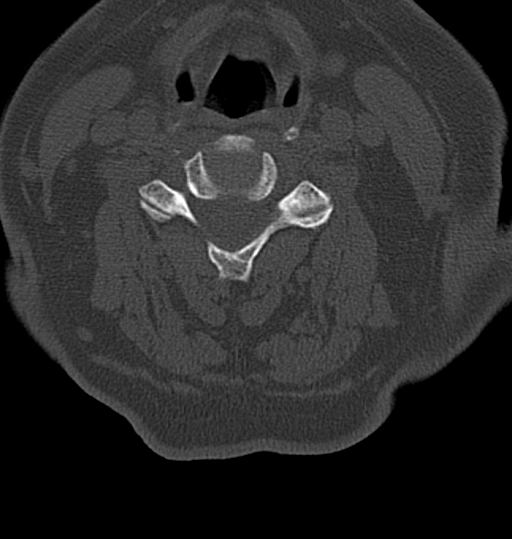
[im 71/106  bone]
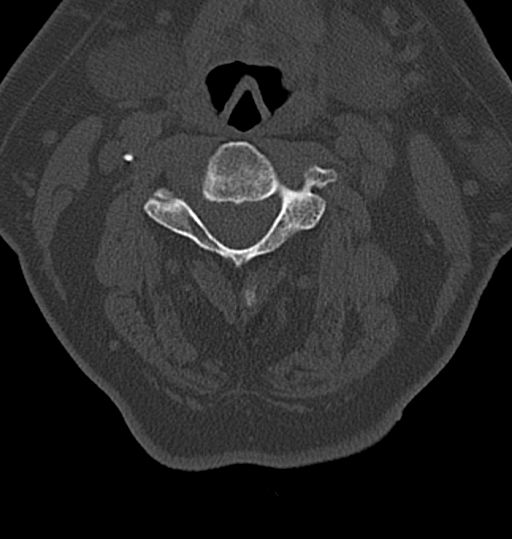
[im 82/106  bone]
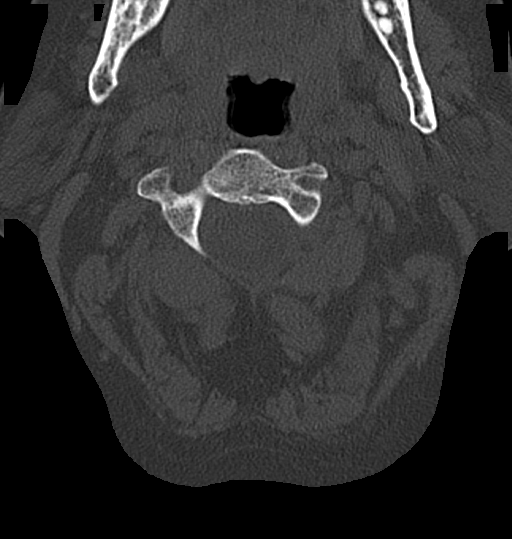
[im 94/106  bone]
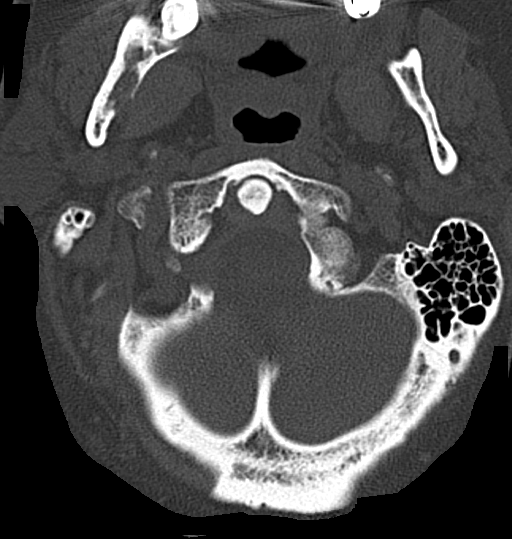

[16 of 47 positions shown; findings below may reference images not displayed]

FINDINGS: CT HEAD FINDINGS

Brain: No intracranial hemorrhage, mass effect or midline shift. No
acute cortical infarction. Ventricular size is stable from prior
exam. Mild cerebral atrophy is stable. No mass lesion is noted on
this unenhanced scan.

Vascular: Mild atherosclerotic calcifications of carotid siphon.

Skull: No skull fracture.

Sinuses/Orbits: There is mucosal thickening with air-fluid level in
left maxillary sinus. Mild mucosal thickening posterior ethmoid air
cells.

Other: None

CT CERVICAL SPINE FINDINGS

Alignment: There is normal alignment.

Skull base and vertebrae: No acute fracture or subluxation.
Degenerative changes are noted C1-C2 articulation. Mild anterior
spurring lower endplate of C3 vertebral body. There is mild anterior
spurring lower endplate of C5 and C6 vertebral body.

Soft tissues and spinal canal: No prevertebral soft tissue swelling.
Spinal canal is patent. Cervical airway is patent.

Disc levels:  Mild disc space flattening at C5-C6 and C6-C7 level.

Upper chest: Visualized upper chest shows no evidence of
pneumothorax.

Other: None
IMPRESSION: 1. No acute intracranial abnormality. No skull fracture is noted.
Mild cerebral atrophy is stable.
2. Paranasal sinuses disease as described above.
3. No cervical spine acute fracture or subluxation. Mild
degenerative changes as described above.

## 2017-11-30 ENCOUNTER — Emergency Department: Payer: Self-pay

## 2017-11-30 ENCOUNTER — Emergency Department
Admission: EM | Admit: 2017-11-30 | Discharge: 2017-11-30 | Disposition: A | Discharge status: Home / Self Care | Payer: Self-pay | Attending: Emergency Medicine | Admitting: Emergency Medicine

## 2017-11-30 ENCOUNTER — Encounter: Payer: Self-pay | Admitting: Medical Oncology

## 2017-11-30 DIAGNOSIS — B349 Viral infection, unspecified: Secondary | ICD-10-CM

## 2017-11-30 DIAGNOSIS — I10 Essential (primary) hypertension: Secondary | ICD-10-CM | POA: Insufficient documentation

## 2017-11-30 DIAGNOSIS — R319 Hematuria, unspecified: Secondary | ICD-10-CM

## 2017-11-30 DIAGNOSIS — J45909 Unspecified asthma, uncomplicated: Secondary | ICD-10-CM | POA: Insufficient documentation

## 2017-11-30 DIAGNOSIS — R0981 Nasal congestion: Secondary | ICD-10-CM

## 2017-11-30 DIAGNOSIS — Z79899 Other long term (current) drug therapy: Secondary | ICD-10-CM | POA: Insufficient documentation

## 2017-11-30 DIAGNOSIS — Z87891 Personal history of nicotine dependence: Secondary | ICD-10-CM | POA: Insufficient documentation

## 2017-11-30 DIAGNOSIS — N23 Unspecified renal colic: Secondary | ICD-10-CM

## 2017-11-30 LAB — URINALYSIS, COMPLETE (UACMP) WITH MICROSCOPIC
Bacteria, UA: NONE SEEN
Bilirubin Urine: NEGATIVE
Glucose, UA: NEGATIVE mg/dL
Hgb urine dipstick: NEGATIVE
Ketones, ur: NEGATIVE mg/dL
Nitrite: NEGATIVE
Protein, ur: 30 mg/dL — AB
Specific Gravity, Urine: 1.018 (ref 1.005–1.030)
pH: 7 (ref 5.0–8.0)

## 2017-11-30 MED ORDER — KETOROLAC TROMETHAMINE 30 MG/ML IJ SOLN
15.0000 mg | Freq: Once | INTRAMUSCULAR | Status: AC
  Administered 2017-11-30: 15 mg via INTRAMUSCULAR
  Filled 2017-11-30: qty 1

## 2017-11-30 NOTE — ED Triage Notes (Signed)
Pt reports rt sided flank pain for a couple of weeks recently diagnosed with kidney stone and nasal congestion that began yesterday.

## 2017-11-30 NOTE — ED Notes (Signed)
Pt ambulatory upon discharge. Verbalized understanding of discharge instructions, follow-up care and how to proceed if his virus/allergies persist. Skin warm and dry. A&O x4.
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
## 2017-11-30 NOTE — ED Notes (Signed)
Pt a/o. C/o right flank pain "simular to last time". Did not follow-up with urology as recommended at last discharge due to family issues. Full ROM.   C/o stuffy nose, upper respiratory drainage. Breath sounds clear bilaterally. No acute respiratory distress.

## 2017-11-30 NOTE — Discharge Instructions (Signed)
As we discussed, it appears that your kidney stone may have passed and it is normal for you to continue having pain for a short period of time after you passed a stone.  Please take over-the-counter ibuprofen and Tylenol as needed or any leftover pain medicine that you still have.  We believe you have a viral infection that is causing your congestion and runny nose and we recommend you read through the provided instructions including regarding sinus rinse, which she may find helps a lot.  Please follow-up as recommended in these documents.
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
## 2017-12-25 ENCOUNTER — Inpatient Hospital Stay
Admission: EM | Admit: 2017-12-25 | Discharge: 2017-12-29 | DRG: 914 | Disposition: A | Discharge status: Home Health | Payer: No Typology Code available for payment source | Attending: Internal Medicine | Admitting: Internal Medicine

## 2017-12-25 ENCOUNTER — Inpatient Hospital Stay: Payer: Self-pay

## 2017-12-25 ENCOUNTER — Encounter: Payer: Self-pay | Admitting: Internal Medicine

## 2017-12-25 ENCOUNTER — Other Ambulatory Visit: Payer: Self-pay

## 2017-12-25 DIAGNOSIS — F329 Major depressive disorder, single episode, unspecified: Secondary | ICD-10-CM

## 2017-12-25 DIAGNOSIS — F429 Obsessive-compulsive disorder, unspecified: Secondary | ICD-10-CM | POA: Diagnosis present

## 2017-12-25 DIAGNOSIS — F10120 Alcohol abuse with intoxication, uncomplicated: Secondary | ICD-10-CM | POA: Diagnosis present

## 2017-12-25 DIAGNOSIS — M109 Gout, unspecified: Secondary | ICD-10-CM | POA: Diagnosis present

## 2017-12-25 DIAGNOSIS — K219 Gastro-esophageal reflux disease without esophagitis: Secondary | ICD-10-CM | POA: Diagnosis present

## 2017-12-25 DIAGNOSIS — Y906 Blood alcohol level of 120-199 mg/100 ml: Secondary | ICD-10-CM | POA: Diagnosis present

## 2017-12-25 DIAGNOSIS — X789XXA Intentional self-harm by unspecified sharp object, initial encounter: Secondary | ICD-10-CM | POA: Diagnosis present

## 2017-12-25 DIAGNOSIS — X781XXA Intentional self-harm by knife, initial encounter: Secondary | ICD-10-CM | POA: Diagnosis present

## 2017-12-25 DIAGNOSIS — S85141A Laceration of anterior tibial artery, right leg, initial encounter: Principal | ICD-10-CM | POA: Diagnosis present

## 2017-12-25 DIAGNOSIS — Z9049 Acquired absence of other specified parts of digestive tract: Secondary | ICD-10-CM

## 2017-12-25 DIAGNOSIS — D62 Acute posthemorrhagic anemia: Secondary | ICD-10-CM | POA: Diagnosis present

## 2017-12-25 DIAGNOSIS — Z87891 Personal history of nicotine dependence: Secondary | ICD-10-CM

## 2017-12-25 DIAGNOSIS — J45909 Unspecified asthma, uncomplicated: Secondary | ICD-10-CM | POA: Diagnosis present

## 2017-12-25 DIAGNOSIS — I10 Essential (primary) hypertension: Secondary | ICD-10-CM | POA: Diagnosis present

## 2017-12-25 DIAGNOSIS — F10920 Alcohol use, unspecified with intoxication, uncomplicated: Secondary | ICD-10-CM

## 2017-12-25 DIAGNOSIS — Y92009 Unspecified place in unspecified non-institutional (private) residence as the place of occurrence of the external cause: Secondary | ICD-10-CM

## 2017-12-25 DIAGNOSIS — S8010XA Contusion of unspecified lower leg, initial encounter: Secondary | ICD-10-CM | POA: Diagnosis present

## 2017-12-25 DIAGNOSIS — F332 Major depressive disorder, recurrent severe without psychotic features: Secondary | ICD-10-CM | POA: Diagnosis not present

## 2017-12-25 DIAGNOSIS — Z79899 Other long term (current) drug therapy: Secondary | ICD-10-CM

## 2017-12-25 DIAGNOSIS — F32A Depression, unspecified: Secondary | ICD-10-CM

## 2017-12-25 DIAGNOSIS — R4588 Nonsuicidal self-harm: Secondary | ICD-10-CM | POA: Diagnosis present

## 2017-12-25 DIAGNOSIS — S81811A Laceration without foreign body, right lower leg, initial encounter: Secondary | ICD-10-CM | POA: Diagnosis present

## 2017-12-25 DIAGNOSIS — Z23 Encounter for immunization: Secondary | ICD-10-CM

## 2017-12-25 LAB — CBC WITH DIFFERENTIAL/PLATELET
Basophils Absolute: 0 10*3/uL (ref 0–0.1)
Basophils Relative: 0 %
Eosinophils Absolute: 0.1 10*3/uL (ref 0–0.7)
Eosinophils Relative: 1 %
HCT: 34.5 % — ABNORMAL LOW (ref 40.0–52.0)
Hemoglobin: 11.2 g/dL — ABNORMAL LOW (ref 13.0–18.0)
Lymphocytes Relative: 35 %
Lymphs Abs: 4.1 10*3/uL — ABNORMAL HIGH (ref 1.0–3.6)
MCH: 27.5 pg (ref 26.0–34.0)
MCHC: 32.6 g/dL (ref 32.0–36.0)
MCV: 84.5 fL (ref 80.0–100.0)
Monocytes Absolute: 1.5 10*3/uL — ABNORMAL HIGH (ref 0.2–1.0)
Monocytes Relative: 12 %
Neutro Abs: 6 10*3/uL (ref 1.4–6.5)
Neutrophils Relative %: 52 %
Platelets: 263 10*3/uL (ref 150–440)
RBC: 4.08 MIL/uL — ABNORMAL LOW (ref 4.40–5.90)
RDW: 18 % — ABNORMAL HIGH (ref 11.5–14.5)
WBC: 11.7 10*3/uL — ABNORMAL HIGH (ref 3.8–10.6)

## 2017-12-25 LAB — COMPREHENSIVE METABOLIC PANEL
ALT: 12 U/L — ABNORMAL LOW (ref 17–63)
AST: 21 U/L (ref 15–41)
Albumin: 3.3 g/dL — ABNORMAL LOW (ref 3.5–5.0)
Alkaline Phosphatase: 67 U/L (ref 38–126)
Anion gap: 16 — ABNORMAL HIGH (ref 5–15)
BUN: 13 mg/dL (ref 6–20)
CO2: 18 mmol/L — ABNORMAL LOW (ref 22–32)
Calcium: 8.3 mg/dL — ABNORMAL LOW (ref 8.9–10.3)
Chloride: 109 mmol/L (ref 101–111)
Creatinine, Ser: 1.32 mg/dL — ABNORMAL HIGH (ref 0.61–1.24)
GFR calc Af Amer: >60 mL/min (ref >60)
GFR calc non Af Amer: 58 mL/min — ABNORMAL LOW (ref >60)
Glucose, Bld: 235 mg/dL — ABNORMAL HIGH (ref 65–99)
Potassium: 3.9 mmol/L (ref 3.5–5.1)
Sodium: 143 mmol/L (ref 135–145)
Total Bilirubin: 0.5 mg/dL (ref 0.3–1.2)
Total Protein: 5.9 g/dL — ABNORMAL LOW (ref 6.5–8.1)

## 2017-12-25 LAB — URINE DRUG SCREEN, QUALITATIVE (ARMC ONLY)
Amphetamines, Ur Screen: NOT DETECTED
Barbiturates, Ur Screen: NOT DETECTED
Benzodiazepine, Ur Scrn: POSITIVE — AB
Cannabinoid 50 Ng, Ur ~~LOC~~: POSITIVE — AB
Cocaine Metabolite,Ur ~~LOC~~: NOT DETECTED
MDMA (Ecstasy)Ur Screen: NOT DETECTED
Methadone Scn, Ur: NOT DETECTED
Opiate, Ur Screen: NOT DETECTED
Phencyclidine (PCP) Ur S: NOT DETECTED
Tricyclic, Ur Screen: NOT DETECTED

## 2017-12-25 LAB — URINALYSIS, COMPLETE (UACMP) WITH MICROSCOPIC
Bacteria, UA: NONE SEEN
Bilirubin Urine: NEGATIVE
Glucose, UA: NEGATIVE mg/dL
Hgb urine dipstick: NEGATIVE
Ketones, ur: 5 mg/dL — AB
Leukocytes, UA: NEGATIVE
Nitrite: NEGATIVE
Protein, ur: NEGATIVE mg/dL
Specific Gravity, Urine: 1.014 (ref 1.005–1.030)
pH: 5 (ref 5.0–8.0)

## 2017-12-25 LAB — HEMOGLOBIN
Hemoglobin: 7.3 g/dL — ABNORMAL LOW (ref 13.0–18.0)
Hemoglobin: 7.6 g/dL — ABNORMAL LOW (ref 13.0–18.0)

## 2017-12-25 LAB — ETHANOL: Alcohol, Ethyl (B): 199 mg/dL — ABNORMAL HIGH (ref <10)

## 2017-12-25 LAB — ABO/RH: ABO/RH(D): A POS

## 2017-12-25 LAB — PREPARE RBC (CROSSMATCH)

## 2017-12-25 LAB — CK: Total CK: 76 U/L (ref 49–397)

## 2017-12-25 LAB — ACETAMINOPHEN LEVEL: Acetaminophen (Tylenol), Serum: <10 ug/mL — ABNORMAL LOW (ref 10–30)

## 2017-12-25 LAB — SALICYLATE LEVEL: Salicylate Lvl: <7 mg/dL (ref 2.8–30.0)

## 2017-12-25 MED ORDER — LORAZEPAM 2 MG/ML IJ SOLN
1.0000 mg | Freq: Once | INTRAMUSCULAR | Status: AC
  Administered 2017-12-25: 1 mg via INTRAVENOUS

## 2017-12-25 MED ORDER — ADULT MULTIVITAMIN W/MINERALS CH
1.0000 | ORAL_TABLET | Freq: Every day | ORAL | Status: DC
  Administered 2017-12-26 – 2017-12-29 (×4): 1 via ORAL
  Filled 2017-12-25 (×4): qty 1

## 2017-12-25 MED ORDER — ONDANSETRON HCL 4 MG/2ML IJ SOLN
INTRAMUSCULAR | Status: AC
  Administered 2017-12-25: 4 mg via INTRAVENOUS
  Filled 2017-12-25: qty 2

## 2017-12-25 MED ORDER — VITAMIN B-1 100 MG PO TABS
100.0000 mg | ORAL_TABLET | Freq: Every day | ORAL | Status: DC
  Administered 2017-12-25 – 2017-12-29 (×5): 100 mg via ORAL
  Filled 2017-12-25 (×5): qty 1

## 2017-12-25 MED ORDER — MOMETASONE FURO-FORMOTEROL FUM 100-5 MCG/ACT IN AERO
2.0000 | INHALATION_SPRAY | Freq: Two times a day (BID) | RESPIRATORY_TRACT | Status: DC
  Administered 2017-12-25 – 2017-12-29 (×7): 2 via RESPIRATORY_TRACT
  Filled 2017-12-25: qty 8.8

## 2017-12-25 MED ORDER — MORPHINE SULFATE (PF) 2 MG/ML IV SOLN
2.0000 mg | INTRAVENOUS | Status: DC | PRN
  Administered 2017-12-27 – 2017-12-28 (×4): 2 mg via INTRAVENOUS
  Filled 2017-12-25 (×4): qty 1

## 2017-12-25 MED ORDER — LORAZEPAM 2 MG PO TABS: 0.0000 mg | ORAL_TABLET | Freq: Four times a day (QID) | ORAL | Status: DC

## 2017-12-25 MED ORDER — SODIUM CHLORIDE 0.9 % IV SOLN
INTRAVENOUS | Status: DC
  Administered 2017-12-25 – 2017-12-26 (×2): via INTRAVENOUS

## 2017-12-25 MED ORDER — LORAZEPAM 2 MG/ML IJ SOLN
1.0000 mg | Freq: Once | INTRAMUSCULAR | Status: AC
  Administered 2017-12-25: 1 mg via INTRAVENOUS
  Filled 2017-12-25: qty 1

## 2017-12-25 MED ORDER — SODIUM CHLORIDE 0.9 % IV BOLUS (SEPSIS)
1000.0000 mL | Freq: Once | INTRAVENOUS | Status: AC
  Administered 2017-12-25: 1000 mL via INTRAVENOUS

## 2017-12-25 MED ORDER — IOPAMIDOL (ISOVUE-370) INJECTION 76%
100.0000 mL | Freq: Once | INTRAVENOUS | Status: AC | PRN
  Administered 2017-12-25: 100 mL via INTRAVENOUS

## 2017-12-25 MED ORDER — PANTOPRAZOLE SODIUM 40 MG PO TBEC
40.0000 mg | DELAYED_RELEASE_TABLET | Freq: Every day | ORAL | Status: DC
  Administered 2017-12-25 – 2017-12-29 (×5): 40 mg via ORAL
  Filled 2017-12-25 (×5): qty 1

## 2017-12-25 MED ORDER — LORAZEPAM 1 MG PO TABS
1.0000 mg | ORAL_TABLET | Freq: Four times a day (QID) | ORAL | Status: DC | PRN
  Administered 2017-12-27: 1 mg via ORAL
  Filled 2017-12-25: qty 1

## 2017-12-25 MED ORDER — LORAZEPAM 1 MG PO TABS
ORAL_TABLET | ORAL | Status: AC
  Filled 2017-12-25: qty 1

## 2017-12-25 MED ORDER — ALBUTEROL SULFATE HFA 108 (90 BASE) MCG/ACT IN AERS
2.0000 | INHALATION_SPRAY | RESPIRATORY_TRACT | Status: DC | PRN
  Filled 2017-12-25: qty 6.7

## 2017-12-25 MED ORDER — CEFAZOLIN SODIUM-DEXTROSE 1-4 GM/50ML-% IV SOLN
1.0000 g | Freq: Once | INTRAVENOUS | Status: AC
  Administered 2017-12-25: 1 g via INTRAVENOUS
  Filled 2017-12-25: qty 50

## 2017-12-25 MED ORDER — ACETAMINOPHEN 650 MG RE SUPP: 650.0000 mg | Freq: Four times a day (QID) | RECTAL | Status: DC | PRN

## 2017-12-25 MED ORDER — LORAZEPAM 2 MG/ML IJ SOLN: 0.0000 mg | Freq: Two times a day (BID) | INTRAMUSCULAR | Status: DC

## 2017-12-25 MED ORDER — LORAZEPAM 2 MG/ML IJ SOLN: 1.0000 mg | Freq: Four times a day (QID) | INTRAMUSCULAR | Status: DC | PRN

## 2017-12-25 MED ORDER — ONDANSETRON HCL 4 MG/2ML IJ SOLN
4.0000 mg | Freq: Four times a day (QID) | INTRAMUSCULAR | Status: DC | PRN
Start: 2017-12-25 — End: 2017-12-29
  Administered 2017-12-25 – 2017-12-27 (×3): 4 mg via INTRAVENOUS
  Filled 2017-12-25 (×3): qty 2

## 2017-12-25 MED ORDER — LISINOPRIL 20 MG PO TABS: 20.0000 mg | ORAL_TABLET | ORAL | Status: DC

## 2017-12-25 MED ORDER — PNEUMOCOCCAL VAC POLYVALENT 25 MCG/0.5ML IJ INJ
0.5000 mL | INJECTION | INTRAMUSCULAR | Status: AC
  Administered 2017-12-28: 0.5 mL via INTRAMUSCULAR
  Filled 2017-12-25: qty 0.5

## 2017-12-25 MED ORDER — INFLUENZA VAC SPLIT QUAD 0.5 ML IM SUSY
0.5000 mL | PREFILLED_SYRINGE | INTRAMUSCULAR | Status: AC
  Administered 2017-12-28: 0.5 mL via INTRAMUSCULAR
  Filled 2017-12-25: qty 0.5

## 2017-12-25 MED ORDER — HYDROCODONE-ACETAMINOPHEN 5-325 MG PO TABS
1.0000 | ORAL_TABLET | ORAL | Status: DC | PRN
  Administered 2017-12-25 (×2): 2 via ORAL
  Administered 2017-12-25: 1 via ORAL
  Administered 2017-12-26 – 2017-12-29 (×16): 2 via ORAL
  Filled 2017-12-25 (×18): qty 2
  Filled 2017-12-25: qty 1
  Filled 2017-12-25: qty 2

## 2017-12-25 MED ORDER — TRAZODONE HCL 50 MG PO TABS: 50.0000 mg | ORAL_TABLET | Freq: Every evening | ORAL | Status: DC | PRN

## 2017-12-25 MED ORDER — ONDANSETRON HCL 4 MG/2ML IJ SOLN
4.0000 mg | Freq: Once | INTRAMUSCULAR | Status: AC
  Administered 2017-12-25: 4 mg via INTRAVENOUS

## 2017-12-25 MED ORDER — LORAZEPAM 1 MG PO TABS
1.0000 mg | ORAL_TABLET | Freq: Once | ORAL | Status: AC
  Administered 2017-12-25: 1 mg via ORAL

## 2017-12-25 MED ORDER — TAMSULOSIN HCL 0.4 MG PO CAPS
0.4000 mg | ORAL_CAPSULE | Freq: Every day | ORAL | Status: DC
  Administered 2017-12-25 – 2017-12-29 (×5): 0.4 mg via ORAL
  Filled 2017-12-25 (×5): qty 1

## 2017-12-25 MED ORDER — SODIUM CHLORIDE 0.9 % IV SOLN
Freq: Once | INTRAVENOUS | Status: AC
  Administered 2017-12-25: 21:00:00 via INTRAVENOUS

## 2017-12-25 MED ORDER — LORAZEPAM 2 MG/ML IJ SOLN: 0.0000 mg | Freq: Four times a day (QID) | INTRAMUSCULAR | Status: DC

## 2017-12-25 MED ORDER — FLUOXETINE HCL 20 MG PO CAPS
20.0000 mg | ORAL_CAPSULE | Freq: Every day | ORAL | Status: DC
  Administered 2017-12-26 – 2017-12-29 (×4): 20 mg via ORAL
  Filled 2017-12-25 (×4): qty 1

## 2017-12-25 MED ORDER — FOLIC ACID 1 MG PO TABS
1.0000 mg | ORAL_TABLET | Freq: Every day | ORAL | Status: DC
  Administered 2017-12-26 – 2017-12-29 (×4): 1 mg via ORAL
  Filled 2017-12-25 (×4): qty 1

## 2017-12-25 MED ORDER — LIDOCAINE-EPINEPHRINE (PF) 1 %-1:200000 IJ SOLN
INTRAMUSCULAR | Status: AC
  Filled 2017-12-25: qty 30

## 2017-12-25 MED ORDER — SODIUM CHLORIDE 0.9 % IV BOLUS (SEPSIS)
500.0000 mL | Freq: Once | INTRAVENOUS | Status: AC
  Administered 2017-12-25: 500 mL via INTRAVENOUS

## 2017-12-25 MED ORDER — ACETAMINOPHEN 325 MG PO TABS: 650.0000 mg | ORAL_TABLET | Freq: Four times a day (QID) | ORAL | Status: DC | PRN

## 2017-12-25 MED ORDER — FENTANYL CITRATE (PF) 100 MCG/2ML IJ SOLN
50.0000 ug | Freq: Once | INTRAMUSCULAR | Status: AC
  Administered 2017-12-25: 50 ug via INTRAVENOUS
  Filled 2017-12-25: qty 2

## 2017-12-25 MED ORDER — ONDANSETRON HCL 4 MG PO TABS
4.0000 mg | ORAL_TABLET | Freq: Four times a day (QID) | ORAL | Status: DC | PRN
  Administered 2017-12-29: 4 mg via ORAL
  Filled 2017-12-25: qty 1
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
## 2017-12-25 NOTE — ED Notes (Signed)
Repeat measurement of right calf is 31cm.

## 2017-12-25 NOTE — ED Notes (Signed)
Po fluids provided with md consent. Pt provided with tissue for nose. Pt states he does not feel like he is in alcohol withdrawal.

## 2017-12-25 NOTE — ED Notes (Signed)
Spoke with dr. Beather Arbour regarding continued tachycardia. Cms intact bilateral feet. No additional orders received for continued tachycardia. Pt provided with urinal for urine sample.
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
## 2017-12-25 NOTE — ED Notes (Signed)
Measurement of right calf 30cm.

## 2017-12-25 NOTE — ED Notes (Signed)
Discussed CIWA of 7 with dr. Beather Arbour. Pt with blood pressure of 96/52. Pt states he does not feel like he is in ETOH withdrawal. Will hold ativan at this time.

## 2017-12-25 NOTE — ED Provider Notes (Signed)
[facility] Emergency Department Provider Note   ____________________________________________   First MD Initiated Contact with Patient 12/25/17 [phone]     (approximate)  I have reviewed the triage vital signs and the nursing notes.   HISTORY  Chief Complaint Alcohol Intoxication and Extremity Laceration  Level 5 caveat: Limited by intoxication  HPI Miguel [name] is a 57 y.o. male brought to the ED from home with a chief complaint of alcohol intoxication and self-inflicted wound.  Patient admits to depression with suicidal thoughts.  Wife was out for the evening and came home to find patient on the kitchen floor, bleeding from his self-inflicted wound with stool covering his body.  Patient with a history of alcohol abuse and self-inflicted wounds.  Complains of being cold and leg laceration.  Tetanus is up-to-date.   Past Medical History:  Diagnosis Date  . Alcohol abuse   . Asthma   . GERD (gastroesophageal reflux disease)   . Gout   . Hypertension   . OCD (obsessive compulsive disorder)   . Renal colic     Patient Active Problem List   Diagnosis Date Noted  . Suicide and self-inflicted injury by cutting and piercing instrument ([place]) 03/10/2017  . Severe recurrent major 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 ([place]) 03/09/2017  . Substance induced mood disorder ([place]) 08/15/2016  . Involuntary commitment 08/15/2016  . Alcohol use disorder, severe, dependence ([place]) 02/05/2016  . Hypertension 12/05/2015  . Tachycardia 12/05/2015  . Gout 11/13/2015  . Chronic back pain 05/02/2015    Past Surgical History:  Procedure Laterality Date  . CHOLECYSTECTOMY  2012    Prior to Admission medications   Medication Sig Start Date End Date Taking? Authorizing Provider  ADVAIR DISKUS 100-50 MCG/DOSE AEPB INHALE 1 PUFF 2 TIMES A DAY. RINSE MOUTH AND SPIT AFTER EACH USE. 10/18/17   Tawni Millers, MD  allopurinol (ZYLOPRIM) 100 MG tablet TAKE ONE TABLET BY MOUTH  EVERY DAY 03/19/17   Tawni Millers, MD  allopurinol (ZYLOPRIM) 300 MG tablet Take 300 mg by mouth every morning.    [provider]  chlordiazePOXIDE (LIBRIUM) 25 MG capsule Take 1 capsule (25 mg total) by mouth 3 (three) times daily as needed for anxiety or withdrawal. 08/09/17   Darel Hong, MD  dexlansoprazole (DEXILANT) 60 MG capsule Take 60 mg by mouth every morning.    [provider]  FLUoxetine (PROZAC) 20 MG capsule Take 1 capsule (20 mg total) by mouth daily. 03/12/17   Pucilowska, Jolanta B, MD  lisinopril (PRINIVIL,ZESTRIL) 20 MG tablet Take 1 tablet (20 mg total) by mouth every morning. 03/12/17   Pucilowska, Herma Ard B, MD  omeprazole (PRILOSEC) 20 MG capsule Take 20 mg by mouth daily.    [provider]  oxyCODONE-acetaminophen (PERCOCET) 10-325 MG tablet Take 1 tablet by mouth every 6 (six) hours as needed for pain. 11/13/17 11/13/18  Triplett, Johnette Abraham B, FNP  risperiDONE (RISPERDAL) 2 MG tablet Take 1 tablet (2 mg total) by mouth 2 (two) times daily. 03/12/17   Pucilowska, Herma Ard B, MD  tamsulosin (FLOMAX) 0.4 MG CAPS capsule Take 1 capsule (0.4 mg total) by mouth daily. 11/13/17   Triplett, Johnette Abraham B, FNP  traZODone (DESYREL) 50 MG tablet Take 1 tablet (50 mg total) by mouth at bedtime as needed for sleep. 03/12/17   Pucilowska, Wardell Honour, MD  VENTOLIN HFA 108 (90 Base) MCG/ACT inhaler INHALE 2 PUFFS EVERY 4 HOURS AS NEEDED 10/18/17   Tawni Millers, MD  fluticasone Asencion Islam)  50 MCG/ACT nasal spray Place 1 spray into both nostrils daily. Patient not taking: Reported on 04/07/2016 01/28/16 04/29/16  Harvest Dark, MD    Allergies Patient has no known allergies.  No family history on file.  Social History Social History   Tobacco Use  . Smoking status: Former Research scientist (life sciences)  . Smokeless tobacco: Never Used  Substance Use Topics  . Alcohol use: Yes    Alcohol/week: 4.2 oz    Types: 7 Glasses of wine per week    Comment: Pt states, "I quit all the hard stuff 4  months ago"  . Drug use: No    Review of Systems  Constitutional: No fever/chills. Eyes: No visual changes. ENT: No sore throat. Cardiovascular: Denies chest pain. Respiratory: Denies shortness of breath. Gastrointestinal: No abdominal pain.  No nausea, no vomiting.  No diarrhea.  No constipation. Genitourinary: Negative for dysuria. Musculoskeletal: Positive for right leg laceration.  Negative for back pain. Skin: Negative for rash. Neurological: Negative for headaches, focal weakness or numbness. Psychiatric:Positive for self-inflicted wound and suicidal thoughts.  ____________________________________________   PHYSICAL EXAM:  VITAL SIGNS: ED Triage Vitals  Enc Vitals Group     BP 12/25/17 0241 (!) 74/47     Pulse Rate 12/25/17 0241 (!) 110     Resp 12/25/17 0241 (!) 24     Temp 12/25/17 0241 (!) 95 F (35 C)     Temp Source 12/25/17 0241 Oral     SpO2 12/25/17 0241 100 %     Weight 12/25/17 0242 224 lb 13.9 oz (102 kg)     Height --      Head Circumference --      Peak Flow --      Pain Score --      Pain Loc --      Pain Edu? --      Excl. in [place]? --     Constitutional: Alert and oriented.  Disheveled appearing and in mild acute distress.  Tearful.  Intoxicated. Eyes: Conjunctivae are normal. PERRL. EOMI. Head: Atraumatic. Nose: No congestion/rhinnorhea. Mouth/Throat: Mucous membranes are moist.  Oropharynx non-erythematous. Neck: No stridor.  No cervical spine tenderness to palpation. Cardiovascular: Normal rate, regular rhythm. Grossly normal heart sounds.  Good peripheral circulation. Respiratory: Normal respiratory effort.  No retractions. Lungs CTAB. Gastrointestinal: Soft and nontender. No distention. No abdominal bruits. No CVA tenderness. Musculoskeletal:  RLE: Approximately 1.5 cm linear laceration to right lateral leg below the knee.  Bleeding was controlled with pressure dressing until patient coughed.  Small arterial bleed identified.  Small  surrounding hematoma.  2+ distal pulses.  Brisk, less than 5-second capillary refill.  Symmetrically cool extremities. Neurologic:  Normal speech and language. No gross focal neurologic deficits are appreciated.  Skin:  Skin is warm, dry and intact. No rash noted. Psychiatric: Mood and affect are normal. Speech and behavior are normal.  ____________________________________________   LABS (all labs ordered are listed, but only abnormal results are displayed)  Labs Reviewed  CBC WITH DIFFERENTIAL/PLATELET - Abnormal; Notable for the following components:      Result Value   WBC 11.7 (*)    RBC 4.08 (*)    Hemoglobin 11.2 (*)    HCT 34.5 (*)    RDW 18.0 (*)    Lymphs Abs 4.1 (*)    Monocytes Absolute 1.5 (*)    All other components within normal limits  COMPREHENSIVE METABOLIC PANEL - Abnormal; Notable for the following components:   CO2 18 (*)  Glucose, Bld 235 (*)    Creatinine, Ser 1.32 (*)    Calcium 8.3 (*)    Total Protein 5.9 (*)    Albumin 3.3 (*)    ALT 12 (*)    GFR calc non Af Amer 58 (*)    Anion gap 16 (*)    All other components within normal limits  ETHANOL - Abnormal; Notable for the following components:   Alcohol, Ethyl (B) 199 (*)    All other components within normal limits  ACETAMINOPHEN LEVEL - Abnormal; Notable for the following components:   Acetaminophen (Tylenol), Serum <10 (*)    All other components within normal limits  SALICYLATE LEVEL  CK  URINALYSIS, COMPLETE (UACMP) WITH MICROSCOPIC  URINE DRUG SCREEN, QUALITATIVE ([facility] ONLY)   ____________________________________________  EKG  None ____________________________________________  RADIOLOGY  ED MD interpretation: None  Official radiology report(s): No results found.  ____________________________________________   PROCEDURES  Procedure(s) performed:     Marland KitchenMarland KitchenLaceration Repair Date/Time: 12/25/2017 3:58 AM Performed by: Paulette Blanch, MD Authorized by: Paulette Blanch, MD    Consent:    Consent obtained:  Verbal   Consent given by:  Patient   Risks discussed:  Infection, pain, poor wound healing and vascular damage Anesthesia (see MAR for exact dosages):    Anesthesia method:  Local infiltration   Local anesthetic:  Lidocaine 1% WITH epi Laceration details:    Location:  Leg   Length (cm):  1.5 Repair type:    Repair type:  Complex Pre-procedure details:    Preparation:  Patient was prepped and draped in usual sterile fashion Exploration:    Hemostasis achieved with:  Direct pressure, epinephrine and tied off vessels   Wound exploration: entire depth of wound probed and visualized     Contaminated: no   Treatment:    Area cleansed with:  Saline   Amount of cleaning:  Standard   Irrigation solution:  Sterile saline   Visualized foreign bodies/material removed: no     Debridement:  None Skin repair:    Repair method:  Sutures   Suture size:  5-0   Wound skin closure material used: Vicryl.   Suture technique:  Simple interrupted   Number of sutures:  5 Approximation:    Approximation:  Close Post-procedure details:    Dressing:  Sterile dressing   Patient tolerance of procedure:  Tolerated well, no immediate complications Comments:     Hemostasis achieved with vicryl sutures. 4-0 Nylon 4 sutures placed externally simple interrupted.     Critical Care performed: Yes, see critical care note(s)  ____________________________________________   INITIAL IMPRESSION / ASSESSMENT AND PLAN / ED COURSE  As part of my medical decision making, I reviewed the following data within the [place] notes reviewed and incorporated, Labs reviewed, Old chart reviewed, A consult was requested and obtained from this/these consultant(s) Psychiatry and Notes from prior ED visits.   57 year old male, intoxicated, with self-inflicted laceration to the right lower leg with a steak knife.  Patient is placed under involuntary commitment for  his safety.  Lower leg was repaired.  Will elevate, apply ice and observe for compartment syndrome.  Clinical Course as of Dec 25 654  Sat Dec 25, 2017  0328 RLE 38cm  [JS]  0403 Blood pressure and heart rate improved with IV fluids.  [JS]  [id number] Patient is sleeping in no acute distress.  Sitter discontinued. RLE 35cm.  Hematoma resolved.  CK is within normal limits.  We  will continue to monitor.  [JS]  [id number] Nurse reports RLE now 30 cm.  Patient showing early signs of alcohol withdrawal.  Will administer oral Ativan.  Blood pressure has improved with IV fluids.  He is medically stabilized and awaiting [facility] psychiatry evaluation.  [JS]    Clinical Course User Index [JS] Paulette Blanch, MD     ____________________________________________   FINAL CLINICAL IMPRESSION(S) / ED DIAGNOSES  Final diagnoses:  Alcoholic intoxication without complication ([place])  Depression, unspecified depression type     ED Discharge Orders    None       Note:  This document was prepared using Dragon voice recognition software and may include unintentional dictation errors.    Paulette Blanch, MD 12/25/17 [phone]

## 2017-12-25 NOTE — ED Notes (Signed)
Beth vaivio, ed tech at bedside to sit with pt at 0250.

## 2017-12-25 NOTE — ED Notes (Signed)
Called social services

## 2017-12-25 NOTE — BH Assessment (Signed)
Assessment Note  Miguel [name] is an 57 y.o. male presenting to the ED with concerns with alcohol intoxication and self-inflicted wound to his leg. .  Patient admits to depression with suicidal thoughts. Patient's wife came home to find patient on the kitchen floor, bleeding from his self-inflicted wound with stool covering his body. Patient has a history of alcohol abuse and self-inflicted wounds.  Patient denies SI/HI while in the ED.  He also denies auditory/visual hallucinations.    Diagnosis: Alcohol Use Disorder  Past Medical History:  Past Medical History:  Diagnosis Date  . Alcohol abuse   . Asthma   . GERD (gastroesophageal reflux disease)   . Gout   . Hypertension   . OCD (obsessive compulsive disorder)   . Renal colic     Past Surgical History:  Procedure Laterality Date  . CHOLECYSTECTOMY  2012    Family History: No family history on file.  Social History:  reports that he has quit smoking. he has never used smokeless tobacco. He reports that he drinks about 4.2 oz of alcohol per week. He reports that he does not use drugs.  Additional Social History:  Alcohol / Drug Use Pain Medications: See PTA Prescriptions: See PTA Over the Counter: See PTA History of alcohol / drug use?: Yes  CIWA: CIWA-Ar BP: 121/74 Pulse Rate: (!) 118 Nausea and Vomiting: mild nausea with no vomiting Tactile Disturbances: none Tremor: moderate, with patient's arms extended Auditory Disturbances: not present Paroxysmal Sweats: no sweat visible Visual Disturbances: not present Anxiety: mildly anxious Headache, Fullness in Head: none present Agitation: normal activity Orientation and Clouding of Sensorium: cannot do serial additions or is uncertain about date CIWA-Ar Total: 7 COWS:    Allergies: No Known Allergies  Home Medications:  (Not in a hospital admission)  OB/GYN Status:  No LMP for male patient.  General Assessment Data Location of Assessment: [facility] ED TTS Assessment: In  system Is this a Tele or Face-to-Face Assessment?: Face-to-Face Is this an Initial Assessment or a Re-assessment for this encounter?: Initial Assessment Marital status: Married Is patient pregnant?: No Pregnancy Status: No Living Arrangements: Spouse/significant other Can pt return to current living arrangement?: Yes Admission Status: Voluntary Is patient capable of signing voluntary admission?: Yes Referral Source: Self/Family/Friend Insurance type: none     Crisis Care Plan Living Arrangements: Spouse/significant other Legal Guardian: Other:(self) Name of Psychiatrist: none reported Name of Therapist: none reported  Education Status Is patient currently in school?: No Current Grade: na Highest grade of school patient has completed: na Name of school: na Contact person: na  Risk to self with the past 6 months Suicidal Ideation: No Has patient been a risk to self within the past 6 months prior to admission? : No Suicidal Intent: No Has patient had any suicidal intent within the past 6 months prior to admission? : No Is patient at risk for suicide?: No Suicidal Plan?: No Has patient had any suicidal plan within the past 6 months prior to admission? : No Access to Means: Yes Specify Access to Suicidal Means: Pt has access to knifes What has been your use of drugs/alcohol within the last 12 months?: ETOH Previous Attempts/Gestures: Yes How many times?: 2 Other Self Harm Risks: alcohol addiction Triggers for Past Attempts: None known Intentional Self Injurious Behavior: None Family Suicide History: No Recent stressful life event(s): Conflict (Comment) Persecutory voices/beliefs?: Yes Depression: Yes Depression Symptoms: Loss of interest in usual pleasures, Feeling worthless/self pity, Feeling angry/irritable Substance abuse history and/or treatment  for substance abuse?: Yes Suicide prevention information given to non-admitted patients: Not applicable  Risk to Others  within the past 6 months Homicidal Ideation: No Does patient have any lifetime risk of violence toward others beyond the six months prior to admission? : No Thoughts of Harm to Others: No Current Homicidal Intent: No Current Homicidal Plan: No Access to Homicidal Means: No History of harm to others?: No Assessment of Violence: None Noted Does patient have access to weapons?: No Criminal Charges Pending?: No Does patient have a court date: No Is patient on probation?: No  Psychosis Hallucinations: None noted Delusions: None noted  Mental Status Report Appearance/Hygiene: In hospital gown Eye Contact: Fair Motor Activity: Freedom of movement Speech: Slow, Slurred Level of Consciousness: Sedated Mood: Depressed Affect: Appropriate to circumstance, Depressed Anxiety Level: None Thought Processes: Relevant Judgement: Impaired Orientation: Person, Place, Time, Situation Obsessive Compulsive Thoughts/Behaviors: None  Cognitive Functioning Concentration: Normal Memory: Recent Intact, Remote Intact IQ: Average Insight: Fair Impulse Control: Fair Appetite: Fair Sleep: No Change Vegetative Symptoms: None  ADLScreening [facility] Assessment Services) Patient's cognitive ability adequate to safely complete daily activities?: Yes Patient able to express need for assistance with ADLs?: Yes Independently performs ADLs?: Yes (appropriate for developmental age)  Prior Inpatient Therapy Prior Inpatient Therapy: Yes Prior Therapy Dates: 2018 Prior Therapy Facilty/Provider(s): [facility] Reason for Treatment: depression  Prior Outpatient Therapy Prior Outpatient Therapy: No Prior Therapy Dates: na Prior Therapy Facilty/Provider(s): na Reason for Treatment: na Does patient have an ACCT team?: No Does patient have Intensive In-House Services?  : No Does patient have [facility] services? : No Does patient have P4CC services?: No  ADL Screening (condition at time of admission) Patient's  cognitive ability adequate to safely complete daily activities?: Yes Patient able to express need for assistance with ADLs?: Yes Independently performs ADLs?: Yes (appropriate for developmental age)       Abuse/Neglect Assessment (Assessment to be complete while patient is alone) Abuse/Neglect Assessment Can Be Completed: Yes Physical Abuse: Denies Verbal Abuse: Denies Sexual Abuse: Denies Exploitation of patient/patient's resources: Denies Self-Neglect: Denies Values / Beliefs Cultural Requests During Hospitalization: None Spiritual Requests During Hospitalization: None Consults Spiritual Care Consult Needed: No Social Work Consult Needed: No      Additional Information 1:1 In Past 12 Months?: No CIRT Risk: No Elopement Risk: No Does patient have medical clearance?: Yes     Disposition:  Disposition Initial Assessment Completed for this Encounter: Yes Disposition of Patient: Pending Review with psychiatrist  On Site Evaluation by:   Reviewed with Physician:    Oneita Hurt 12/25/2017 7:24 AM

## 2017-12-25 NOTE — Progress Notes (Addendum)
Repeat Hb this afternoon dropped to 7.3 from 11 1 unit blood ordered with 2 units on hold Slightly tachycardic- but BP stable so far Monitor closely and repeat hb check after transfusion. Vascular team notified about the patient, ortho notified by me- but said will sign off as vascular following.

## 2017-12-25 NOTE — Progress Notes (Signed)
Pt arrived to room 140 from ED with suicide sitter. Pt is A&Ox4, denies SI at this time. Has scattered abrasions to bilateral lower legs, mid chest, and laceration to right lower leg that has been sutured. Right leg is elevated. Room was prepped per suicide policy. Pain relieved by vicodin. HR ranges between 100-110s. At [place], Dr. Tressia Miners notified of pt's last HGB 7.3. 1 unit PRBC ordered.   [place], Jerry Caras

## 2017-12-25 NOTE — Discharge Instructions (Signed)
1. Suture removal in 7-10 days.

## 2017-12-25 NOTE — ED Notes (Signed)
Report to felicia, rn.  

## 2017-12-25 NOTE — ED Notes (Signed)
Awaiting ancef from pharmacy.
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
## 2017-12-25 NOTE — ED Notes (Signed)
md at bedside to suture wound.
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
## 2017-12-25 NOTE — ED Notes (Signed)
Right leg elevated on three pillows, measurement of 38cm noted by dr. Beather Arbour. Will continue to monitor pt for compartment syndrome.

## 2017-12-25 NOTE — ED Notes (Signed)
Pt resting.

## 2017-12-25 NOTE — ED Provider Notes (Addendum)
----------------------------------------- 8:00 AM on 12/25/2017 -----------------------------------------  Pt under ivc. Signed out to me at this time. Self inflicted wound to leg,  + blood loss. Was somewhat hypotensive here but recovered w/ 3 l ivf.  Bleeding stopped. Compartments are soft at this time.  No active bleeding. Getting ativan per ciwa protocol. Tachycardia, as well as initial low pressures/temps likely multifactorial per sign out.  Will continue to watch. He is awake and alert in nad.  UDS pending.  Serial h/h not ordered by prior md bc 1. Bleeding stopped, and 2, anticipated dilutional effects of hydration will give artificially lower results.   ----------------------------------------- 8:50 AM on 12/25/2017 -----------------------------------------  No bleeding, patient does remain tachycardic we are giving him aliquots of Ativan, he has been evaluated by psychiatry who agreed with IVC obviously.  I will admit to the hospitalist service based upon patient's persistent tachycardia which is likely multifactorial.   CRITICAL CARE Performed by: [name] Amor   Total critical care time: 45  minutes  Critical care time was exclusive of separately billable procedures and treating other patients.  Critical care was necessary to treat or prevent imminent or life-threatening deterioration.  Critical care was time spent personally by me on the following activities: development of treatment plan with patient and/or surrogate as well as nursing, discussions with consultants, evaluation of patient's response to treatment, examination of patient, obtaining history from patient or surrogate, ordering and performing treatments and interventions, ordering and review of laboratory studies, ordering and review of radiographic studies, pulse oximetry and re-evaluation of patient's condition.   EKG shows sinus tach rate 116, performed at 856.  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 .  In addition, he is possibly in mild alcohol withdrawal.  We are continuing to treat him with pain medication and Ativan.  He still has strong DP and posterior tibial pulses on the affected side CTA however ordered by the hospitalist is positive for possible arterial injury, there is no evidence of ongoing bleeding however, there is a large anterior tender hematoma.  Still feels soft but more firm than it was. Concern for compartment syndrome exists. t.  We will discuss with the hospitalist service given this result and possibly vascular surgery but he does have good pulses in the leg is very well perfused.  We will continue to give Ativan and pain medications with an eye on his blood pressure. Psych agrees with ivc.  ----------------------------------------- 11:43 AM on 12/25/2017 -----------------------------------------  With Dr. Lucky Cowboy  a vascular surgery, he agrees about my concern for possible compartment syndrome states that this would be something he would handle, he will come evaluate the patient, we will see if we can get set up for compartment pressures. He also is aware of findings on ct for tibial artery injury but with good perfusion he thinks that there is no reason for emergent repair at this time.  She did already receive Ancef, he is complaining of pain we are giving him pain medication, IV fluids, anxiolytics/Ativan and we are continue to observe him.  He does have a ready bed upstairs but I will hold him here pending vascular surgery assessment of  his leg  ----------------------------------------- 1:11 PM on  12/25/2017 -----------------------------------------  Vascular surgery very much appreciate the consult, his compartment are still although there is bruising, not consistent with compartment syndrome.  They are tender but not exquisitely so, he can flex and extend his foot against resistance with no difficulty and no evidence of significant discomfort, and remains with good pulses etc.  Does not apparently have pain out of proportion to exam.  Vascular surgery is feels that they do not at this time need to check pressures and I think that is not unreasonable, it is not a hard compartment and again he has excellent ability to range his foot without any significant discomfort or proximally.  Vascular surgery will continue to observe for the development of that entity and he is being admitted to the hospitalist service   [name] Amor, MD 12/25/17 0300    [name] Amor, MD 12/25/17 9233    [name] Amor, MD 12/25/17 0076    [name] Amor, MD 12/25/17 2263    [name] Amor, MD 12/25/17 3354    [name] Amor, MD 12/25/17 1133    [name] Amor, MD 12/25/17 1143    [name] Amor, MD 12/25/17 1148    [name] Amor, MD 12/25/17 1312

## 2017-12-25 NOTE — ED Notes (Signed)
Pt cleansed of stool, bear hugger on medium initiatied.

## 2017-12-26 DIAGNOSIS — F332 Major depressive disorder, recurrent severe without psychotic features: Secondary | ICD-10-CM | POA: Diagnosis not present

## 2017-12-26 DIAGNOSIS — R600 Localized edema: Secondary | ICD-10-CM

## 2017-12-26 DIAGNOSIS — S8011XA Contusion of right lower leg, initial encounter: Secondary | ICD-10-CM

## 2017-12-26 LAB — PREPARE RBC (CROSSMATCH)

## 2017-12-26 LAB — BASIC METABOLIC PANEL
Anion gap: 7 (ref 5–15)
BUN: 12 mg/dL (ref 6–20)
CO2: 21 mmol/L — ABNORMAL LOW (ref 22–32)
Calcium: 7.6 mg/dL — ABNORMAL LOW (ref 8.9–10.3)
Chloride: 111 mmol/L (ref 101–111)
Creatinine, Ser: 1.11 mg/dL (ref 0.61–1.24)
GFR calc Af Amer: >60 mL/min (ref >60)
GFR calc non Af Amer: >60 mL/min (ref >60)
Glucose, Bld: 116 mg/dL — ABNORMAL HIGH (ref 65–99)
Potassium: 3.7 mmol/L (ref 3.5–5.1)
Sodium: 139 mmol/L (ref 135–145)

## 2017-12-26 LAB — CBC
HCT: 21.2 % — ABNORMAL LOW (ref 40.0–52.0)
Hemoglobin: 6.9 g/dL — ABNORMAL LOW (ref 13.0–18.0)
MCH: 27.7 pg (ref 26.0–34.0)
MCHC: 32.6 g/dL (ref 32.0–36.0)
MCV: 84.9 fL (ref 80.0–100.0)
Platelets: 136 10*3/uL — ABNORMAL LOW (ref 150–440)
RBC: 2.49 MIL/uL — ABNORMAL LOW (ref 4.40–5.90)
RDW: 17.7 % — ABNORMAL HIGH (ref 11.5–14.5)
WBC: 8.4 10*3/uL (ref 3.8–10.6)

## 2017-12-26 LAB — HEMOGLOBIN: Hemoglobin: 7.5 g/dL — ABNORMAL LOW (ref 13.0–18.0)

## 2017-12-26 MED ORDER — SODIUM CHLORIDE 0.9 % IV SOLN: Freq: Once | INTRAVENOUS | Status: DC

## 2017-12-26 NOTE — Progress Notes (Signed)
Noted 2 hour post blood transfusion Hgb 6.9. Dr Marcille Blanco aware. Pnt not actively bleeding. Resting in bed and appears comfortable. Sitter at bedside. No other issues or concerns at this time. No new orders. Will continue to monitor and assess.
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
## 2017-12-26 NOTE — Progress Notes (Signed)
Blood transfusion is complete. Pnt tol well no issues or concerns. No s/sx of reaction. Vital signs remain stable and pnt afebrile. Sitter at bedside. Bed low and lock, call bell in reach. Will continue to monitor and assess.

## 2017-12-26 NOTE — Progress Notes (Signed)
[facility]  Daily Progress Note   Subjective  - * No surgery found *  Still with swelling and pain.  He reports as about the same as yesterday Getting blood this am.  Hgb was 7.  No obvious ongoing bleeding, likely just Hgb settling out after major blood loss 36 hours ago.  Objective Vitals:   12/26/17 0040 12/26/17 0338 12/26/17 0840 12/26/17 0907  BP: 130/78 119/74 122/63 116/64  Pulse: (!) 106 97 94 91  Resp: 16 18 18 19   Temp: 99.5 F (37.5 C) 98.9 F (37.2 C) 98.6 F (37 C) 99.1 F (37.3 C)  TempSrc: Oral Oral Oral Oral  SpO2: 97% 100% 95% 94%  Weight:      Height:        Intake/Output Summary (Last 24 hours) at 12/26/2017 0952 Last data filed at 12/26/2017 0400 Gross per 24 hour  Intake 2117.5 ml  Output 250 ml  Net 1867.5 ml    PULM  CTAB CV  RRR VASC  Moderate RLE swelling.  2+ right PT pulse.  Motor and sensory function intact on exam.  Laboratory CBC    Component Value Date/Time   WBC 8.4 12/26/2017 0217   HGB 6.9 (L) 12/26/2017 0217   HGB 14.9 02/05/2016 1135   HCT 21.2 (L) 12/26/2017 0217   HCT 43.5 02/05/2016 1135   PLT 136 (L) 12/26/2017 0217   PLT 194 02/05/2016 1135    BMET    Component Value Date/Time   NA 139 12/26/2017 0217   NA 141 05/06/2016 1033   NA 141 06/27/2014 0221   K 3.7 12/26/2017 0217   K 3.7 06/27/2014 0221   CL 111 12/26/2017 0217   CL 107 06/27/2014 0221   CO2 21 (L) 12/26/2017 0217   CO2 24 06/27/2014 0221   GLUCOSE 116 (H) 12/26/2017 0217   GLUCOSE 113 (H) 06/27/2014 0221   BUN 12 12/26/2017 0217   BUN 12 05/06/2016 1033   BUN 21 (H) 06/27/2014 0221   CREATININE 1.11 12/26/2017 0217   CREATININE 1.28 06/27/2014 0221   CALCIUM 7.6 (L) 12/26/2017 0217   CALCIUM 8.2 (L) 06/27/2014 0221   GFRNONAA >60 12/26/2017 0217   GFRNONAA >60 06/27/2014 0221   GFRAA >60 12/26/2017 0217   GFRAA >60 06/27/2014 0221    Assessment/Planning:    Right calf hematoma  Neuro and vascular status  intact  Leg is swollen but stable.  Does not appear to have compartment syndrome   Elevate leg as much as tolerated  Hold on weight bearing for 24-48 hours  No anticoagulants  Will follow    Leotis Pain  12/26/2017, 9:52 AM

## 2017-12-26 NOTE — Progress Notes (Signed)
[facility] MD Progress Note  12/26/2017 10:16 AM Miguel [name]  MRN:  [id number]  Subjective:   Miguel [name] feels much better physically and mentally. He is no longer suicidal and is able to contract for safety. He is anxious about further treatment as it is unclear if he needs surgery. He is receiving blood now. Denies symptoms of depression or psychosis. Seems committeed to sobriety.  Treatment plan. Please continue Prozac 20 mg daily. Patient unable to discuss substance abuse treatment as he is unsure about his medical status.  Social/disposition. Worried about his aging wife with MS. He will return home ASAP. Follow up with [facility].  Principal Problem: Severe recurrent major 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 ([place]) Diagnosis:   Patient Active Problem List   Diagnosis Date Noted  . Severe recurrent major 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 ([place]) [F33.2] 03/09/2017    Priority: High  . Leg hematoma [S80.10XA] 12/25/2017  . Suicide and self-inflicted injury by cutting and piercing instrument ([place]) [X78.9XXA] 03/10/2017  . Substance induced mood disorder ([place]) [F19.94] 08/15/2016  . Involuntary commitment [Z04.6] 08/15/2016  . Alcohol use disorder, severe, dependence ([place]) [F10.20] 02/05/2016  . Hypertension [I10] 12/05/2015  . Tachycardia [R00.0] 12/05/2015  . Gout [M10.9] 11/13/2015  . Chronic back pain [M54.9, G89.29] 05/02/2015   Total Time spent with patient: 20 minutes  Past Psychiatric History: alcohlism  Past Medical History:  Past Medical History:  Diagnosis Date  . Alcohol abuse   . Asthma   . GERD (gastroesophageal reflux disease)   . Gout   . Hypertension   . OCD (obsessive compulsive disorder)   . Renal colic     Past Surgical History:  Procedure Laterality Date  . CHOLECYSTECTOMY  2012   Family History:  Family History  Problem Relation Age of Onset  . Alcohol abuse Father    Family Psychiatric  History: none reported Social History:  Social History    Substance and Sexual Activity  Alcohol Use Yes  . Alcohol/week: 4.2 oz  . Types: 7 Glasses of wine per week   Comment: Pt states, "I quit all the hard stuff 4 months ago"     Social History   Substance and Sexual Activity  Drug Use No    Social History   Socioeconomic History  . Marital status: Married    Spouse name: None  . Number of children: None  . Years of education: None  . Highest education level: None  Social Needs  . Financial resource strain: None  . Food insecurity - worry: None  . Food insecurity - inability: None  . Transportation needs - medical: None  . Transportation needs - non-medical: None  Occupational History  . None  Tobacco Use  . Smoking status: Former Research scientist (life sciences)  . Smokeless tobacco: Never Used  . Tobacco comment: quit 30 years ago  Substance and Sexual Activity  . Alcohol use: Yes    Alcohol/week: 4.2 oz    Types: 7 Glasses of wine per week    Comment: Pt states, "I quit all the hard stuff 4 months ago"  . Drug use: No  . Sexual activity: None  Other Topics Concern  . None  Social History Narrative   Lives at home with his wife and takes care of his wife. Independent at baseline   Additional Social History:    Pain Medications: See PTA Prescriptions: See PTA Over the Counter: See PTA History of alcohol / drug use?: Yes  Sleep: Fair  Appetite:  Fair  Current Medications: Current Facility-Administered Medications  Medication Dose Route Frequency Provider Last Rate Last Dose  . 0.9 %  sodium chloride infusion   Intravenous Continuous Gladstone Lighter, MD 75 mL/hr at 12/25/17 1458    . 0.9 %  sodium chloride infusion   Intravenous Once Gladstone Lighter, MD      . acetaminophen (TYLENOL) tablet 650 mg  650 mg Oral Q6H PRN Gladstone Lighter, MD       Or  . acetaminophen (TYLENOL) suppository 650 mg  650 mg Rectal Q6H PRN Gladstone Lighter, MD      . FLUoxetine (PROZAC) capsule 20 mg  20 mg Oral Daily  Gladstone Lighter, MD      . folic acid (FOLVITE) tablet 1 mg  1 mg Oral Daily Gladstone Lighter, MD      . HYDROcodone-acetaminophen (NORCO/VICODIN) 5-325 MG per tablet 1-2 tablet  1-2 tablet Oral Q4H PRN Gladstone Lighter, MD   2 tablet at 12/26/17 0746  . Influenza vac split quadrivalent PF (FLUARIX) injection 0.5 mL  0.5 mL Intramuscular Tomorrow-1000 Gladstone Lighter, MD      . LORazepam (ATIVAN) injection 0-4 mg  0-4 mg Intravenous Q6H Gladstone Lighter, MD       Followed by  . [START ON 12/27/2017] LORazepam (ATIVAN) injection 0-4 mg  0-4 mg Intravenous Q12H Gladstone Lighter, MD      . LORazepam (ATIVAN) tablet 1 mg  1 mg Oral Q6H PRN Gladstone Lighter, MD       Or  . LORazepam (ATIVAN) injection 1 mg  1 mg Intravenous Q6H PRN Gladstone Lighter, MD      . mometasone-formoterol (DULERA) 100-5 MCG/ACT inhaler 2 puff  2 puff Inhalation BID Gladstone Lighter, MD   2 puff at 12/26/17 0746  . morphine 2 MG/ML injection 2 mg  2 mg Intravenous Q4H PRN Gladstone Lighter, MD      . multivitamin with minerals tablet 1 tablet  1 tablet Oral Daily Gladstone Lighter, MD      . ondansetron (ZOFRAN) tablet 4 mg  4 mg Oral Q6H PRN Gladstone Lighter, MD       Or  . ondansetron (ZOFRAN) injection 4 mg  4 mg Intravenous Q6H PRN Gladstone Lighter, MD   4 mg at 12/25/17 1435  . pantoprazole (PROTONIX) EC tablet 40 mg  40 mg Oral Daily Paulette Blanch, MD   40 mg at 12/25/17 0911  . pneumococcal 23 valent vaccine (PNU-IMMUNE) injection 0.5 mL  0.5 mL Intramuscular Tomorrow-1000 Gladstone Lighter, MD      . tamsulosin (FLOMAX) capsule 0.4 mg  0.4 mg Oral Daily Paulette Blanch, MD   0.4 mg at 12/25/17 0911  . thiamine (VITAMIN B-1) tablet 100 mg  100 mg Oral Daily Paulette Blanch, MD   100 mg at 12/25/17 0911  . traZODone (DESYREL) tablet 50 mg  50 mg Oral QHS PRN Gladstone Lighter, MD        Lab Results:  Results for orders placed or performed during the hospital encounter of 12/25/17 (from the past 48  hour(s))  CBC with Differential     Status: Abnormal   Collection Time: 12/25/17  2:50 AM  Result Value Ref Range   WBC 11.7 (H) 3.8 - 10.6 K/uL   RBC 4.08 (L) 4.40 - 5.90 MIL/uL   Hemoglobin 11.2 (L) 13.0 - 18.0 g/dL   HCT 34.5 (L) 40.0 - 52.0 %   MCV 84.5 80.0 - 100.0 fL  MCH 27.5 26.0 - 34.0 pg   MCHC 32.6 32.0 - 36.0 g/dL   RDW 18.0 (H) 11.5 - 14.5 %   Platelets 263 150 - 440 K/uL   Neutrophils Relative % 52 %   Neutro Abs 6.0 1.4 - 6.5 K/uL   Lymphocytes Relative 35 %   Lymphs Abs 4.1 (H) 1.0 - 3.6 K/uL   Monocytes Relative 12 %   Monocytes Absolute 1.5 (H) 0.2 - 1.0 K/uL   Eosinophils Relative 1 %   Eosinophils Absolute 0.1 0 - 0.7 K/uL   Basophils Relative 0 %   Basophils Absolute 0.0 0 - 0.1 K/uL    Comment: Performed at [facility], [address] [place], [place] [address]  Comprehensive metabolic panel     Status: Abnormal   Collection Time: 12/25/17  2:50 AM  Result Value Ref Range   Sodium 143 135 - 145 mmol/L   Potassium 3.9 3.5 - 5.1 mmol/L   Chloride 109 101 - 111 mmol/L   CO2 18 (L) 22 - 32 mmol/L   Glucose, Bld 235 (H) 65 - 99 mg/dL   BUN 13 6 - 20 mg/dL   Creatinine, Ser 1.32 (H) 0.61 - 1.24 mg/dL   Calcium 8.3 (L) 8.9 - 10.3 mg/dL   Total Protein 5.9 (L) 6.5 - 8.1 g/dL   Albumin 3.3 (L) 3.5 - 5.0 g/dL   AST 21 15 - 41 U/L   ALT 12 (L) 17 - 63 U/L   Alkaline Phosphatase 67 38 - 126 U/L   Total Bilirubin 0.5 0.3 - 1.2 mg/dL   GFR calc non Af Amer 58 (L) >60 mL/min   GFR calc Af Amer >60 >60 mL/min    Comment: (NOTE) The eGFR has been calculated using the CKD EPI equation. This calculation has not been validated in all clinical situations. eGFR's persistently <60 mL/min signify possible Chronic Kidney Disease.    Anion gap 16 (H) 5 - 15    Comment: Performed at [facility], [address] [place], [place] [address]  Ethanol     Status: Abnormal   Collection Time: 12/25/17  2:50 AM  Result Value Ref Range   Alcohol, Ethyl  (B) 199 (H) <10 mg/dL    Comment:        LOWEST DETECTABLE LIMIT FOR SERUM ALCOHOL IS 10 mg/dL FOR MEDICAL PURPOSES ONLY Performed at [facility], [address] [place], [place] [address]   Salicylate level     Status: None   Collection Time: 12/25/17  2:50 AM  Result Value Ref Range   Salicylate Lvl <4.1 2.8 - 30.0 mg/dL    Comment: Performed at [facility], [address] [place], [place] [address]  Acetaminophen level     Status: Abnormal   Collection Time: 12/25/17  2:50 AM  Result Value Ref Range   Acetaminophen (Tylenol), Serum <10 (L) 10 - 30 ug/mL    Comment:        THERAPEUTIC CONCENTRATIONS VARY SIGNIFICANTLY. A RANGE OF 10-30 ug/mL MAY BE AN EFFECTIVE CONCENTRATION FOR MANY PATIENTS. HOWEVER, SOME ARE BEST TREATED AT CONCENTRATIONS OUTSIDE THIS RANGE. ACETAMINOPHEN CONCENTRATIONS >150 ug/mL AT 4 HOURS AFTER INGESTION AND >50 ug/mL AT 12 HOURS AFTER INGESTION ARE OFTEN ASSOCIATED WITH TOXIC REACTIONS. Performed at [facility], [address] [place], [place] [address]   CK     Status: None   Collection Time: 12/25/17  2:50 AM  Result Value Ref Range   Total CK 76  49 - 397 U/L    Comment: Performed at [facility], [address] [place], [place] [address]  Urine Drug Screen, Qualitative [facility] only)     Status: Abnormal   Collection Time: 12/25/17  3:09 AM  Result Value Ref Range   Tricyclic, Ur Screen NONE DETECTED NONE DETECTED   Amphetamines, Ur Screen NONE DETECTED NONE DETECTED   MDMA (Ecstasy)Ur Screen NONE DETECTED NONE DETECTED   Cocaine Metabolite,Ur [place] NONE DETECTED NONE DETECTED   Opiate, Ur Screen NONE DETECTED NONE DETECTED   Phencyclidine (PCP) Ur S NONE DETECTED NONE DETECTED   Cannabinoid 50 Ng, Ur  POSITIVE (A) NONE DETECTED   Barbiturates, Ur Screen NONE DETECTED NONE DETECTED   Benzodiazepine, Ur Scrn POSITIVE (A) NONE DETECTED   Methadone Scn, Ur NONE DETECTED NONE DETECTED    Comment:  (NOTE) Tricyclics + metabolites, urine    Cutoff 1000 ng/mL Amphetamines + metabolites, urine  Cutoff 1000 ng/mL MDMA (Ecstasy), urine              Cutoff 500 ng/mL Cocaine Metabolite, urine          Cutoff 300 ng/mL Opiate + metabolites, urine        Cutoff 300 ng/mL Phencyclidine (PCP), urine         Cutoff 25 ng/mL Cannabinoid, urine                 Cutoff 50 ng/mL Barbiturates + metabolites, urine  Cutoff 200 ng/mL Benzodiazepine, urine              Cutoff 200 ng/mL Methadone, urine                   Cutoff 300 ng/mL The urine drug screen provides only a preliminary, unconfirmed analytical test result and should not be used for non-medical purposes. Clinical consideration and professional judgment should be applied to any positive drug screen result due to possible interfering substances. A more specific alternate chemical method must be used in order to obtain a confirmed analytical result. Gas chromatography / mass spectrometry (GC/MS) is the preferred confirmat ory method. Performed at [facility], [address] [place], [place] [address]   Urinalysis, Complete w Microscopic     Status: Abnormal   Collection Time: 12/25/17  8:40 AM  Result Value Ref Range   Color, Urine YELLOW (A) YELLOW   APPearance CLEAR (A) CLEAR   Specific Gravity, Urine 1.014 1.005 - 1.030   pH 5.0 5.0 - 8.0   Glucose, UA NEGATIVE NEGATIVE mg/dL   Hgb urine dipstick NEGATIVE NEGATIVE   Bilirubin Urine NEGATIVE NEGATIVE   Ketones, ur 5 (A) NEGATIVE mg/dL   Protein, ur NEGATIVE NEGATIVE mg/dL   Nitrite NEGATIVE NEGATIVE   Leukocytes, UA NEGATIVE NEGATIVE   RBC / HPF 0-5 0 - 5 RBC/hpf   WBC, UA 0-5 0 - 5 WBC/hpf   Bacteria, UA NONE SEEN NONE SEEN   Squamous Epithelial / LPF 0-5 (A) NONE SEEN   Mucus PRESENT    Hyaline Casts, UA PRESENT     Comment: Performed at [facility], [address] [place], [place] [address]  Hemoglobin     Status: Abnormal   Collection Time:  12/25/17  1:13 PM  Result Value Ref Range   Hemoglobin 7.3 (L) 13.0 - 18.0 g/dL    Comment: RESULT REPEATED AND VERIFIED Performed at [facility], [address], [place], [place] [address]   ABO/Rh     Status: None  Collection Time: 12/25/17  1:13 PM  Result Value Ref Range   ABO/RH(D)      A POS Performed at [facility], [address] [place], [place] [address]   Type and screen [place]     Status: None (Preliminary result)   Collection Time: 12/25/17  5:03 PM  Result Value Ref Range   ABO/RH(D) A POS    Antibody Screen NEG    Sample Expiration 12/28/2017    Unit Number [id number]    Blood Component Type RED CELLS,LR    Unit division 00    Status of Unit ISSUED,FINAL    Transfusion Status OK TO TRANSFUSE    Crossmatch Result Compatible    Unit Number [id number]    Blood Component Type RED CELLS,LR    Unit division 00    Status of Unit ISSUED    Transfusion Status OK TO TRANSFUSE    Crossmatch Result      Compatible Performed at [facility], [address] [place], [place] [address]   Hemoglobin     Status: Abnormal   Collection Time: 12/25/17  5:26 PM  Result Value Ref Range   Hemoglobin 7.6 (L) 13.0 - 18.0 g/dL    Comment: Performed at [facility], [address] [place], [place] [address]  Prepare RBC     Status: None   Collection Time: 12/25/17  5:30 PM  Result Value Ref Range   Order Confirmation      ORDER PROCESSED BY BLOOD BANK Performed at [facility], [address] [place], [place] [address]   Basic metabolic panel     Status: Abnormal   Collection Time: 12/26/17  2:17 AM  Result Value Ref Range   Sodium 139 135 - 145 mmol/L   Potassium 3.7 3.5 - 5.1 mmol/L   Chloride 111 101 - 111 mmol/L   CO2 21 (L) 22 - 32 mmol/L   Glucose, Bld 116 (H) 65 - 99 mg/dL   BUN 12 6 - 20 mg/dL   Creatinine, Ser 1.11 0.61 - 1.24 mg/dL   Calcium 7.6 (L) 8.9 - 10.3 mg/dL   GFR  calc non Af Amer >60 >60 mL/min   GFR calc Af Amer >60 >60 mL/min    Comment: (NOTE) The eGFR has been calculated using the CKD EPI equation. This calculation has not been validated in all clinical situations. eGFR's persistently <60 mL/min signify possible Chronic Kidney Disease.    Anion gap 7 5 - 15    Comment: Performed at [facility], [address] [place], [place] [address]  CBC     Status: Abnormal   Collection Time: 12/26/17  2:17 AM  Result Value Ref Range   WBC 8.4 3.8 - 10.6 K/uL   RBC 2.49 (L) 4.40 - 5.90 MIL/uL   Hemoglobin 6.9 (L) 13.0 - 18.0 g/dL   HCT 21.2 (L) 40.0 - 52.0 %   MCV 84.9 80.0 - 100.0 fL   MCH 27.7 26.0 - 34.0 pg   MCHC 32.6 32.0 - 36.0 g/dL   RDW 17.7 (H) 11.5 - 14.5 %   Platelets 136 (L) 150 - 440 K/uL    Comment: Performed at [facility], [address] [place], [place] [address]  Prepare RBC     Status: None   Collection Time: 12/26/17  7:00 AM  Result Value Ref Range   Order Confirmation      ORDER PROCESSED BY BLOOD BANK Performed at [facility], [address] [place], [place] [address]     Blood Alcohol level:  Lab Results  Component Value Date   ETH 199 (H) 12/25/2017   ETH 236 (H) 79/72/8206    Metabolic Disorder Labs: Lab Results  Component Value Date   HGBA1C 5.1 03/12/2017   MPG 100 03/12/2017   No results found for: PROLACTIN Lab Results  Component Value Date   CHOL 144 03/12/2017   TRIG 107 03/12/2017   HDL 52 03/12/2017   CHOLHDL 2.8 03/12/2017   VLDL 21 03/12/2017   LDLCALC 71 03/12/2017   LDLCALC 70 06/27/2014    Physical Findings: AIMS:  , ,  ,  ,    CIWA:  CIWA-Ar Total: 0 COWS:     Musculoskeletal: Strength & Muscle Tone: within normal limits Gait & Station: normal Patient leans: N/A  Psychiatric Specialty Exam: Physical Exam  Nursing note and vitals reviewed. Psychiatric: His speech is normal and behavior is normal. Thought content normal. His affect is blunt.  Cognition and memory are normal. He expresses impulsivity.    Review of Systems  Neurological: Negative.   Psychiatric/Behavioral: Positive for substance abuse.  All other systems reviewed and are negative.   Blood pressure 116/64, pulse 91, temperature 99.1 F (37.3 C), temperature source Oral, resp. rate 19, height 6' 4"  (1.93 m), weight 101.6 kg (224 lb), SpO2 94 %.Body mass index is 27.27 kg/m.  General Appearance: Casual  Eye Contact:  Good  Speech:  Clear and Coherent  Volume:  Normal  Mood:  Anxious  Affect:  Appropriate  Thought Process:  Goal Directed and Descriptions of Associations: Intact  Orientation:  Full (Time, Place, and Person)  Thought Content:  WDL  Suicidal Thoughts:  No  Homicidal Thoughts:  No  Memory:  Immediate;   Fair Recent;   Fair Remote;   Fair  Judgement:  Impaired  Insight:  Shallow  Psychomotor Activity:  Normal  Concentration:  Concentration: Fair and Attention Span: Fair  Recall:  [employer] of Knowledge:  Fair  Language:  Fair  Akathisia:  No  Handed:  Right  AIMS (if indicated):     Assets:  Communication Skills Desire for Improvement Financial Resources/Insurance Housing Intimacy Resilience Social Support Transportation  ADL's:  Intact  Cognition:  WNL  Sleep:        Treatment Plan Summary: Daily contact with patient to assess and evaluate symptoms and progress in treatment and Medication management   PLAN: 1. I will discontinue IVC and sitter.  2. Please continue Prozac 20 mg for depression.  3. Psychiatry will follow up about substance abuse.  Orson Slick, MD 12/26/2017, 10:16 AM

## 2017-12-27 DIAGNOSIS — S8011XA Contusion of right lower leg, initial encounter: Secondary | ICD-10-CM

## 2017-12-27 LAB — BPAM RBC
Blood Product Expiration Date: 201902182359
Blood Product Expiration Date: 201902212359
ISSUE DATE / TIME: 201902092051
ISSUE DATE / TIME: 201902100840
Unit Type and Rh: 6200
Unit Type and Rh: 6200

## 2017-12-27 LAB — TYPE AND SCREEN
ABO/RH(D): A POS
Antibody Screen: NEGATIVE
Unit division: 0
Unit division: 0

## 2017-12-27 LAB — CBC
HCT: 23.1 % — ABNORMAL LOW (ref 40.0–52.0)
Hemoglobin: 7.7 g/dL — ABNORMAL LOW (ref 13.0–18.0)
MCH: 28.7 pg (ref 26.0–34.0)
MCHC: 33.5 g/dL (ref 32.0–36.0)
MCV: 85.5 fL (ref 80.0–100.0)
Platelets: 148 10*3/uL — ABNORMAL LOW (ref 150–440)
RBC: 2.7 MIL/uL — ABNORMAL LOW (ref 4.40–5.90)
RDW: 17.3 % — ABNORMAL HIGH (ref 11.5–14.5)
WBC: 7.9 10*3/uL (ref 3.8–10.6)

## 2017-12-27 LAB — HIV ANTIBODY (ROUTINE TESTING W REFLEX): HIV Screen 4th Generation wRfx: NONREACTIVE

## 2017-12-27 MED ORDER — FERROUS SULFATE 325 (65 FE) MG PO TABS
325.0000 mg | ORAL_TABLET | Freq: Two times a day (BID) | ORAL | Status: DC
  Administered 2017-12-27 – 2017-12-29 (×4): 325 mg via ORAL
  Filled 2017-12-27 (×4): qty 1

## 2017-12-27 NOTE — Progress Notes (Signed)
[facility]  Daily Progress Note   Subjective  - * No surgery found *  Still with a lot of pain.  Has been up very little  Objective Vitals:   12/27/17 0012 12/27/17 0354 12/27/17 0800 12/27/17 1508  BP: (!) 149/75 127/65 138/77 (!) 142/78  Pulse: 95 93 94 99  Resp: 18 16 17 20   Temp: 99.4 F (37.4 C) 99.7 F (37.6 C) 98.7 F (37.1 C) 99.1 F (37.3 C)  TempSrc: Oral Oral Oral Oral  SpO2: 97% 100% 100% 100%  Weight:      Height:        Intake/Output Summary (Last 24 hours) at 12/27/2017 1541 Last data filed at 12/27/2017 1400 Gross per 24 hour  Intake 1658.75 ml  Output 975 ml  Net 683.75 ml    PULM  CTAB CV  RRR VASC  Palpable PT pulse right foot, good perfusion present.  Motor and sensory intact.  Leg swelling about the same to slightly improved from yesterday  Laboratory CBC    Component Value Date/Time   WBC 7.9 12/27/2017 0321   HGB 7.7 (L) 12/27/2017 0321   HGB 14.9 02/05/2016 1135   HCT 23.1 (L) 12/27/2017 0321   HCT 43.5 02/05/2016 1135   PLT 148 (L) 12/27/2017 0321   PLT 194 02/05/2016 1135    BMET    Component Value Date/Time   NA 139 12/26/2017 0217   NA 141 05/06/2016 1033   NA 141 06/27/2014 0221   K 3.7 12/26/2017 0217   K 3.7 06/27/2014 0221   CL 111 12/26/2017 0217   CL 107 06/27/2014 0221   CO2 21 (L) 12/26/2017 0217   CO2 24 06/27/2014 0221   GLUCOSE 116 (H) 12/26/2017 0217   GLUCOSE 113 (H) 06/27/2014 0221   BUN 12 12/26/2017 0217   BUN 12 05/06/2016 1033   BUN 21 (H) 06/27/2014 0221   CREATININE 1.11 12/26/2017 0217   CREATININE 1.28 06/27/2014 0221   CALCIUM 7.6 (L) 12/26/2017 0217   CALCIUM 8.2 (L) 06/27/2014 0221   GFRNONAA >60 12/26/2017 0217   GFRNONAA >60 06/27/2014 0221   GFRAA >60 12/26/2017 0217   GFRAA >60 06/27/2014 0221    Assessment/Planning:    Right calf injury  Stable  No signs of compartment syndrome currently  Continue elevation and increase activity as  tolerated    Leotis Pain  12/27/2017, 3:41 PM

## 2017-12-27 NOTE — Care Management (Signed)
Met with patient after noting that patient does not have health insurance. He states he drives; lives with wife; and already followed at [facility].
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
## 2017-12-28 DIAGNOSIS — S8011XA Contusion of right lower leg, initial encounter: Secondary | ICD-10-CM

## 2017-12-28 LAB — CBC
HCT: 23.6 % — ABNORMAL LOW (ref 40.0–52.0)
Hemoglobin: 7.7 g/dL — ABNORMAL LOW (ref 13.0–18.0)
MCH: 28.4 pg (ref 26.0–34.0)
MCHC: 32.7 g/dL (ref 32.0–36.0)
MCV: 86.9 fL (ref 80.0–100.0)
Platelets: 169 10*3/uL (ref 150–440)
RBC: 2.71 MIL/uL — ABNORMAL LOW (ref 4.40–5.90)
RDW: 17.7 % — ABNORMAL HIGH (ref 11.5–14.5)
WBC: 7.7 10*3/uL (ref 3.8–10.6)

## 2017-12-28 LAB — BASIC METABOLIC PANEL
Anion gap: 9 (ref 5–15)
BUN: 6 mg/dL (ref 6–20)
CO2: 25 mmol/L (ref 22–32)
Calcium: 8.3 mg/dL — ABNORMAL LOW (ref 8.9–10.3)
Chloride: 106 mmol/L (ref 101–111)
Creatinine, Ser: 0.79 mg/dL (ref 0.61–1.24)
GFR calc Af Amer: >60 mL/min (ref >60)
GFR calc non Af Amer: >60 mL/min (ref >60)
Glucose, Bld: 108 mg/dL — ABNORMAL HIGH (ref 65–99)
Potassium: 3.7 mmol/L (ref 3.5–5.1)
Sodium: 140 mmol/L (ref 135–145)

## 2017-12-28 MED ORDER — HYDROCODONE-ACETAMINOPHEN 5-325 MG PO TABS: 1.0000 | ORAL_TABLET | ORAL | 0 refills | Status: DC | PRN

## 2017-12-28 MED ORDER — TRAZODONE HCL 50 MG PO TABS: 50.0000 mg | ORAL_TABLET | Freq: Every evening | ORAL | 1 refills | Status: DC | PRN

## 2017-12-28 MED ORDER — TAMSULOSIN HCL 0.4 MG PO CAPS: 0.4000 mg | ORAL_CAPSULE | Freq: Every day | ORAL | 0 refills | Status: DC

## 2017-12-28 MED ORDER — MORPHINE SULFATE (PF) 2 MG/ML IV SOLN: 2.0000 mg | Freq: Three times a day (TID) | INTRAVENOUS | Status: DC | PRN

## 2017-12-28 MED ORDER — LISINOPRIL 20 MG PO TABS: 20.0000 mg | ORAL_TABLET | ORAL | 1 refills | Status: DC

## 2017-12-28 MED ORDER — SENNOSIDES-DOCUSATE SODIUM 8.6-50 MG PO TABS
2.0000 | ORAL_TABLET | Freq: Every day | ORAL | Status: DC
  Administered 2017-12-28 – 2017-12-29 (×2): 2 via ORAL
  Filled 2017-12-28 (×2): qty 2

## 2017-12-28 MED ORDER — POLYETHYLENE GLYCOL 3350 17 G PO PACK
17.0000 g | PACK | Freq: Every day | ORAL | Status: DC | PRN
  Administered 2017-12-29: 17 g via ORAL
  Filled 2017-12-28: qty 1

## 2017-12-28 MED ORDER — FERROUS SULFATE 325 (65 FE) MG PO TABS: 325.0000 mg | ORAL_TABLET | Freq: Two times a day (BID) | ORAL | 1 refills | Status: DC
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
## 2017-12-29 ENCOUNTER — Telehealth: Payer: Self-pay | Admitting: Pharmacy Technician

## 2017-12-29 LAB — CBC
HCT: 23.5 % — ABNORMAL LOW (ref 40.0–52.0)
Hemoglobin: 7.9 g/dL — ABNORMAL LOW (ref 13.0–18.0)
MCH: 29 pg (ref 26.0–34.0)
MCHC: 33.5 g/dL (ref 32.0–36.0)
MCV: 86.7 fL (ref 80.0–100.0)
Platelets: 215 10*3/uL (ref 150–440)
RBC: 2.71 MIL/uL — ABNORMAL LOW (ref 4.40–5.90)
RDW: 17.8 % — ABNORMAL HIGH (ref 11.5–14.5)
WBC: 7.3 10*3/uL (ref 3.8–10.6)

## 2017-12-29 MED ORDER — ALUM & MAG HYDROXIDE-SIMETH 200-200-20 MG/5ML PO SUSP
30.0000 mL | Freq: Four times a day (QID) | ORAL | Status: DC | PRN
  Administered 2017-12-29: 30 mL via ORAL
  Filled 2017-12-29: qty 30

## 2017-12-29 NOTE — Discharge Summary (Signed)
[facility] NAME: Miguel [name]    MR#:  [id number]  DATE OF BIRTH:  09-15-1961  DATE OF ADMISSION:  12/25/2017   ADMITTING PHYSICIAN: Gladstone Lighter, MD  DATE OF DISCHARGE:  12/29/17  PRIMARY CARE PHYSICIAN: Patient, No Pcp Per   ADMISSION DIAGNOSIS:   Alcoholic intoxication without complication ([place]) [Q00.867] Depression, unspecified depression type [F32.9]  DISCHARGE DIAGNOSIS:   Principal Problem:   Severe recurrent major 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 ([place]) Active Problems:   Suicide and self-inflicted injury by cutting and piercing instrument ([place])   Leg hematoma   SECONDARY DIAGNOSIS:   Past Medical History:  Diagnosis Date  . Alcohol abuse   . Asthma   . GERD (gastroesophageal reflux disease)   . Gout   . Hypertension   . OCD (obsessive compulsive disorder)   . Renal colic     HOSPITAL COURSE:   DonFaganis a57 y.o.malewith a known history of significant alcohol abuse, GERD, hypertension, OCD presents to hospital secondary to self-inflicted right leg wound with significant hematoma and bleeding.  1. Right lower leg bleeding with hematoma- CT angiogram with injury to anterior tibial artery, no active bleeding now but significant hematoma present on admissiom -Transfused. Ice pack, keep the leg elevated -Appreciate vascular consult. No signs of compartment syndrome at this time. No indication for surgery at this point- clinically improving. -CK level is within normal limits -Pain control and keep the leg elevated -Weight bearing as tolerated.   2. Acute posthemorrhagic anemia-secondary to bleeding and hematoma. - Received 2 units of packed RBC transfusion received this admission.  Continue to monitor. His tachycardia has improved and blood pressure is stable. Transfuse if hemoglobin is less than 7 -Started iron supplements -Hemoglobin remained stable at 7.9   2. Alcohol abuse- known  history of alcohol abuse, was On CIWA protocol  3. GERD-continue PPI  4. OCD-continue home medications. Patient on  trazodone at bedtime when necessary  5. Self inflicting knife injury-appreciate psych consult. Being taken off suicide precautions and also sitter. Not under commitment anymore. Outpatient follow-up recommended -Not suicidal any more -Outpatient follow-up recommended with [facility]  Patient ambulated 60 feet with physical therapy. Has a  walker at home. Will be discharged home today     DISCHARGE CONDITIONS:   Guarded  CONSULTS OBTAINED:   Treatment Team:  Algernon Huxley, MD Clovis Fredrickson, MD Clapacs, Madie Reno, MD  DRUG ALLERGIES:   No Known Allergies DISCHARGE MEDICATIONS:   Allergies as of 12/29/2017   No Known Allergies     Medication List    STOP taking these medications   chlordiazePOXIDE 25 MG capsule Commonly known as:  LIBRIUM   FLUoxetine 20 MG capsule Commonly known as:  PROZAC   oxyCODONE-acetaminophen 10-325 MG tablet Commonly known as:  PERCOCET   risperiDONE 2 MG tablet Commonly known as:  RISPERDAL     TAKE these medications   ADVAIR DISKUS 100-50 MCG/DOSE Aepb Generic drug:  Fluticasone-Salmeterol INHALE 1 PUFF 2 TIMES A DAY. RINSE MOUTH AND SPIT AFTER EACH USE.   allopurinol 300 MG tablet Commonly known as:  ZYLOPRIM Take 300 mg by mouth daily. What changed:  Another medication with the same name was removed. Continue taking this medication, and follow the directions you see here.   DEXILANT 60 MG capsule Generic drug:  dexlansoprazole Take 60 mg by mouth every morning.   ferrous sulfate 325 (65 FE) MG tablet Take 1 tablet (325 mg total)  by mouth 2 (two) times daily with a meal.   HYDROcodone-acetaminophen 5-325 MG tablet Commonly known as:  NORCO/VICODIN Take 1-2 tablets by mouth every 4 (four) hours as needed for moderate pain or severe pain.   lisinopril 20 MG tablet Commonly known as:  PRINIVIL,ZESTRIL Take  1 tablet (20 mg total) by mouth every morning.   tamsulosin 0.4 MG Caps capsule Commonly known as:  FLOMAX Take 1 capsule (0.4 mg total) by mouth daily.   traZODone 50 MG tablet Commonly known as:  DESYREL Take 1 tablet (50 mg total) by mouth at bedtime as needed for sleep.   VENTOLIN HFA 108 (90 Base) MCG/ACT inhaler Generic drug:  albuterol INHALE 2 PUFFS EVERY 4 HOURS AS NEEDED        DISCHARGE INSTRUCTIONS:   1. PCP follow-up in 1-2 weeks 2. Vascular follow up as needed  DIET:   Cardiac diet  ACTIVITY:   Activity as tolerated  OXYGEN:   Home Oxygen: No.  Oxygen Delivery: room air  DISCHARGE LOCATION:   home   If you experience worsening of your admission symptoms, develop shortness of breath, life threatening emergency, suicidal or homicidal thoughts you must seek medical attention immediately by calling 911 or calling your MD immediately  if symptoms less severe.  You Must read complete instructions/literature along with all the possible adverse reactions/side effects for all the Medicines you take and that have been prescribed to you. Take any new Medicines after you have completely understood and accpet all the possible adverse reactions/side effects.   Please note  You were cared for by a hospitalist during your hospital stay. If you have any questions about your discharge medications or the care you received while you were in the hospital after you are discharged, you can call the unit and asked to speak with the hospitalist on call if the hospitalist that took care of you is not available. Once you are discharged, your primary care physician will handle any further medical issues. Please note that NO REFILLS for any discharge medications will be authorized once you are discharged, as it is imperative that you return to your primary care physician (or establish a relationship with a primary care physician if you do not have one) for your aftercare needs so that  they can reassess your need for medications and monitor your lab values.    On the day of Discharge:  VITAL SIGNS:   Blood pressure (!) 153/78, pulse 92, temperature 98.3 F (36.8 C), temperature source Oral, resp. rate 18, height 6\' 4"  (1.93 m), weight 101.6 kg (224 lb), SpO2 100 %.  PHYSICAL EXAMINATION:    GENERAL:57 y.o.-year-old patient lying in the bed, inacute distress from right leg pain.  EYES: Pupils equal, round, reactive to light and accommodation. No scleral icterus. Extraocular muscles intact.  HEENT: Head atraumatic, normocephalic. Oropharynx and nasopharynx clear.  NECK: Supple, no jugular venous distention. No thyroid enlargement, no tenderness.  LUNGS: Normal breath sounds bilaterally, no wheezing, rales,rhonchi or crepitation. No use of accessory muscles of respiration.  CARDIOVASCULAR: S1, S2 normal. No murmurs, rubs, or gallops.  ABDOMEN: Soft, nontender, nondistended. Bowel sounds present. No organomegaly or mass.  EXTREMITIES:right leg lateral part is swelling is improving, less firm, more soft today. Some swelling around the suture is still seen, motor, sensory function intact and good palpable pulses noted. 1+ pedal edema noted No cyanosis, or clubbing.  NEUROLOGIC: Cranial nerves II through XII are intact. Muscle strength 5/5 in all extremities. Sensation intact.  Gait not checked.  PSYCHIATRIC: The patient is alert and oriented x 3. SKIN: No obvious rash, lesion, or ulcer.     DATA REVIEW:   CBC Recent Labs  Lab 12/29/17 0416  WBC 7.3  HGB 7.9*  HCT 23.5*  PLT 215    Chemistries  Recent Labs  Lab 12/25/17 0250  12/28/17 0356  NA 143   < > 140  K 3.9   < > 3.7  CL 109   < > 106  CO2 18*   < > 25  GLUCOSE 235*   < > 108*  BUN 13   < > 6  CREATININE 1.32*   < > 0.79  CALCIUM 8.3*   < > 8.3*  AST 21  --   --   ALT 12*  --   --   ALKPHOS 67  --   --   BILITOT 0.5  --   --    < > = values in this interval not displayed.      Microbiology Results  Results for orders placed or performed during the hospital encounter of 09/02/15  Urine culture     Status: None   Collection Time: 09/02/15  9:09 PM  Result Value Ref Range Status   Specimen Description URINE, CLEAN CATCH  Final   Special Requests Normal  Final   Culture MULTIPLE SPECIES PRESENT, SUGGEST RECOLLECTION  Final   Report Status 09/04/2015 FINAL  Final    RADIOLOGY:  No results found.   Management plans discussed with the patient, family and they are in agreement.  CODE STATUS:     Code Status Orders  (From admission, onward)        Start     Ordered   12/25/17 1405  Full code  Continuous     12/25/17 1404    Code Status History    Date Active Date Inactive Code Status Order ID Comments User Context   03/09/2017 21:07 03/12/2017 15:06 Full Code [id number]  Gonzella Lex, MD Inpatient   08/15/2016 00:09 08/15/2016 15:28 Full Code [id number]  Nena Polio, MD ED      TOTAL TIME TAKING CARE OF THIS PATIENT: 38 minutes.    Gladstone Lighter M.D on 12/29/2017 at 11:58 AM  Between 7am to 6pm - Pager - [phone]  After 6pm go to [email or web address] - Proofreader  [facility]  Office  [phone]  CC: Primary care physician; Patient, No Pcp Per   Note: This dictation was prepared with Dragon dictation along with smaller phrase technology. Any transcriptional errors that result from this process are unintentional.

## 2017-12-29 NOTE — Telephone Encounter (Signed)
Patient eligible to receive medication assistance at [facility] through 2019, as long as eligibility requirements continue to be met.  [facility]

## 2017-12-29 NOTE — Care Management (Signed)
RNCM spoke with patient regarding home health order for PT.  He has multiple complaints about his care here at [facility]: food not being delivered; no bowel movement; not being about to walk. He takes care of wife at home. His transportation is "an 57 year old women (wife's aunt)" and "I just want to get out of here".  He has a walker and lift chair at home. Referral to [facility] to see if they can provide HHPT under charity with current diagnosis. His PCP is with [facility].

## 2017-12-29 NOTE — Care Management (Signed)
[facility] for HH[facility] and [place] under charity.  MD updated.  No other RNCM needs.

## 2018-01-17 ENCOUNTER — Encounter: Payer: Self-pay | Admitting: Emergency Medicine

## 2018-01-17 ENCOUNTER — Other Ambulatory Visit: Payer: Self-pay

## 2018-01-17 ENCOUNTER — Emergency Department
Admission: EM | Admit: 2018-01-17 | Discharge: 2018-01-18 | Disposition: A | Discharge status: Home / Self Care | Payer: Self-pay | Attending: Emergency Medicine | Admitting: Emergency Medicine

## 2018-01-17 DIAGNOSIS — I10 Essential (primary) hypertension: Secondary | ICD-10-CM | POA: Insufficient documentation

## 2018-01-17 DIAGNOSIS — Z87891 Personal history of nicotine dependence: Secondary | ICD-10-CM | POA: Insufficient documentation

## 2018-01-17 DIAGNOSIS — F10230 Alcohol dependence with withdrawal, uncomplicated: Secondary | ICD-10-CM | POA: Insufficient documentation

## 2018-01-17 DIAGNOSIS — Z4802 Encounter for removal of sutures: Secondary | ICD-10-CM

## 2018-01-17 DIAGNOSIS — X58XXXA Exposure to other specified factors, initial encounter: Secondary | ICD-10-CM | POA: Insufficient documentation

## 2018-01-17 DIAGNOSIS — F10930 Alcohol use, unspecified with withdrawal, uncomplicated: Secondary | ICD-10-CM

## 2018-01-17 DIAGNOSIS — M545 Low back pain, unspecified: Secondary | ICD-10-CM

## 2018-01-17 DIAGNOSIS — F419 Anxiety disorder, unspecified: Secondary | ICD-10-CM | POA: Insufficient documentation

## 2018-01-17 DIAGNOSIS — J45909 Unspecified asthma, uncomplicated: Secondary | ICD-10-CM | POA: Insufficient documentation

## 2018-01-17 DIAGNOSIS — S81811D Laceration without foreign body, right lower leg, subsequent encounter: Secondary | ICD-10-CM | POA: Insufficient documentation

## 2018-01-17 DIAGNOSIS — Z79899 Other long term (current) drug therapy: Secondary | ICD-10-CM | POA: Insufficient documentation

## 2018-01-17 LAB — COMPREHENSIVE METABOLIC PANEL
ALT: 11 U/L — ABNORMAL LOW (ref 17–63)
AST: 15 U/L (ref 15–41)
Albumin: 3.7 g/dL (ref 3.5–5.0)
Alkaline Phosphatase: 71 U/L (ref 38–126)
Anion gap: 7 (ref 5–15)
BUN: 13 mg/dL (ref 6–20)
CO2: 25 mmol/L (ref 22–32)
Calcium: 8.8 mg/dL — ABNORMAL LOW (ref 8.9–10.3)
Chloride: 109 mmol/L (ref 101–111)
Creatinine, Ser: 1.05 mg/dL (ref 0.61–1.24)
GFR calc Af Amer: >60 mL/min (ref >60)
GFR calc non Af Amer: >60 mL/min (ref >60)
Glucose, Bld: 105 mg/dL — ABNORMAL HIGH (ref 65–99)
Potassium: 3.9 mmol/L (ref 3.5–5.1)
Sodium: 141 mmol/L (ref 135–145)
Total Bilirubin: 0.9 mg/dL (ref 0.3–1.2)
Total Protein: 6.4 g/dL — ABNORMAL LOW (ref 6.5–8.1)

## 2018-01-17 LAB — URINE DRUG SCREEN, QUALITATIVE (ARMC ONLY)
Amphetamines, Ur Screen: NOT DETECTED
Barbiturates, Ur Screen: NOT DETECTED
Benzodiazepine, Ur Scrn: NOT DETECTED
Cannabinoid 50 Ng, Ur ~~LOC~~: POSITIVE — AB
Cocaine Metabolite,Ur ~~LOC~~: NOT DETECTED
MDMA (Ecstasy)Ur Screen: NOT DETECTED
Methadone Scn, Ur: NOT DETECTED
Opiate, Ur Screen: NOT DETECTED
Phencyclidine (PCP) Ur S: NOT DETECTED
Tricyclic, Ur Screen: NOT DETECTED

## 2018-01-17 LAB — CBC
HCT: 27.8 % — ABNORMAL LOW (ref 40.0–52.0)
Hemoglobin: 9 g/dL — ABNORMAL LOW (ref 13.0–18.0)
MCH: 26.3 pg (ref 26.0–34.0)
MCHC: 32.5 g/dL (ref 32.0–36.0)
MCV: 80.8 fL (ref 80.0–100.0)
Platelets: 356 10*3/uL (ref 150–440)
RBC: 3.44 MIL/uL — ABNORMAL LOW (ref 4.40–5.90)
RDW: 18 % — ABNORMAL HIGH (ref 11.5–14.5)
WBC: 4.4 10*3/uL (ref 3.8–10.6)

## 2018-01-17 LAB — ETHANOL: Alcohol, Ethyl (B): <10 mg/dL (ref <10)

## 2018-01-17 LAB — TROPONIN I: Troponin I: <0.03 ng/mL (ref <0.03)

## 2018-01-17 MED ORDER — KETOROLAC TROMETHAMINE 30 MG/ML IJ SOLN
30.0000 mg | Freq: Once | INTRAMUSCULAR | Status: AC
  Administered 2018-01-17: 30 mg via INTRAVENOUS
  Filled 2018-01-17: qty 1

## 2018-01-17 MED ORDER — LIDOCAINE 5 % EX PTCH
1.0000 | MEDICATED_PATCH | CUTANEOUS | Status: DC
  Administered 2018-01-18: 1 via TRANSDERMAL
  Filled 2018-01-17: qty 1

## 2018-01-17 MED ORDER — CHLORDIAZEPOXIDE HCL 25 MG PO CAPS
25.0000 mg | ORAL_CAPSULE | Freq: Once | ORAL | Status: AC
  Administered 2018-01-17: 25 mg via ORAL
  Filled 2018-01-17: qty 1

## 2018-01-17 NOTE — ED Triage Notes (Signed)
Pt arrived to the ED for complaints of not feeling well. Pt reports that he was a  Every day drinker and 2 weeks ago he quit cold [place]. Two days ago the Pt has 1 drink and started to feel bad after that. Pt is very anxious during triage and reports that he "feels like he is convulsing on his back."

## 2018-01-18 ENCOUNTER — Encounter (INDEPENDENT_AMBULATORY_CARE_PROVIDER_SITE_OTHER): Payer: Self-pay | Admitting: Vascular Surgery

## 2018-01-18 LAB — LIPASE, BLOOD: Lipase: 27 U/L (ref 11–51)

## 2018-01-18 LAB — TROPONIN I: Troponin I: <0.03 ng/mL (ref <0.03)

## 2018-01-18 MED ORDER — LIDOCAINE 5 % EX PTCH: 1.0000 | MEDICATED_PATCH | Freq: Two times a day (BID) | CUTANEOUS | 0 refills | Status: DC

## 2018-01-18 MED ORDER — CHLORDIAZEPOXIDE HCL 25 MG PO CAPS: ORAL_CAPSULE | ORAL | 0 refills | Status: DC

## 2018-01-18 MED ORDER — BACITRACIN ZINC 500 UNIT/GM EX OINT
TOPICAL_OINTMENT | Freq: Once | CUTANEOUS | Status: AC
  Administered 2018-01-18: 1 via TOPICAL
  Filled 2018-01-18: qty 0.9

## 2018-01-18 NOTE — ED Provider Notes (Signed)
[facility] Emergency Department Provider Note   ____________________________________________   First MD Initiated Contact with Patient 01/17/18 2304     (approximate)  I have reviewed the triage vital signs and the nursing notes.   HISTORY  Chief Complaint Withdrawal; Back Pain; and Anxiety    HPI Miguel [name] is a 57 y.o. male who comes into the hospital today with some back pain.  He reports that it started this morning when he woke up.  He reports that it came on suddenly and it feels like convulsions in his back.  He reports that he could not catch his breath and it was really bad around 7 PM.  He states that he tried to lay down but it did not get better.  He was here about a month ago and states that he hit his head.  He reports that when he touches his head it also hurts in the pain seems to shoot up his back.  He denies any falls or injuries since then.  He reports that he just could not get comfortable.  The patient states that the pain scared him.  He also has been having some shakes and has a history of alcoholism.  The patient initially states that he has not drank for the past month but states that he had a few shots a couple of days ago.  The patient states that he is been having some occasional sweats and epigastric pains.  He was not feeling well today.  He decided to come into the hospital today to get checked out.  Past Medical History:  Diagnosis Date  . Alcohol abuse   . Asthma   . GERD (gastroesophageal reflux disease)   . Gout   . Hypertension   . OCD (obsessive compulsive disorder)   . Renal colic     Patient Active Problem List   Diagnosis Date Noted  . Leg hematoma 12/25/2017  . Suicide and self-inflicted injury by cutting and piercing instrument ([place]) 03/10/2017  . Severe recurrent major 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 ([place]) 03/09/2017  . Substance induced mood disorder ([place]) 08/15/2016  . Involuntary commitment  08/15/2016  . Alcohol use disorder, severe, dependence ([place]) 02/05/2016  . Hypertension 12/05/2015  . Tachycardia 12/05/2015  . Gout 11/13/2015  . Chronic back pain 05/02/2015    Past Surgical History:  Procedure Laterality Date  . CHOLECYSTECTOMY  2012    Prior to Admission medications   Medication Sig Start Date End Date Taking? Authorizing Provider  ADVAIR DISKUS 100-50 MCG/DOSE AEPB INHALE 1 PUFF 2 TIMES A DAY. RINSE MOUTH AND SPIT AFTER EACH USE. 10/18/17   Tawni Millers, MD  allopurinol (ZYLOPRIM) 300 MG tablet Take 300 mg by mouth daily.    [provider]  chlordiazePOXIDE (LIBRIUM) 25 MG capsule 50 mg 3 times a day x 3 days 50 mg 2 times a day for 3 days 50 mg once a day for 3 days stop 01/18/18   Loney Hering, MD  dexlansoprazole (DEXILANT) 60 MG capsule Take 60 mg by mouth every morning.    [provider]  ferrous sulfate 325 (65 FE) MG tablet Take 1 tablet (325 mg total) by mouth 2 (two) times daily with a meal. 12/28/17   Gladstone Lighter, MD  HYDROcodone-acetaminophen (NORCO/VICODIN) 5-325 MG tablet Take 1-2 tablets by mouth every 4 (four) hours as needed for moderate pain or severe pain. 12/28/17   Gladstone Lighter, MD  lidocaine (LIDODERM) 5 % Place 1  patch onto the skin every 12 (twelve) hours. Remove & Discard patch within 12 hours or as directed by MD 01/18/18 01/18/19  Loney Hering, MD  lisinopril (PRINIVIL,ZESTRIL) 20 MG tablet Take 1 tablet (20 mg total) by mouth every morning. 12/28/17   Gladstone Lighter, MD  tamsulosin (FLOMAX) 0.4 MG CAPS capsule Take 1 capsule (0.4 mg total) by mouth daily. 12/28/17   Gladstone Lighter, MD  traZODone (DESYREL) 50 MG tablet Take 1 tablet (50 mg total) by mouth at bedtime as needed for sleep. 12/28/17   Gladstone Lighter, MD  VENTOLIN HFA 108 (90 Base) MCG/ACT inhaler INHALE 2 PUFFS EVERY 4 HOURS AS NEEDED 10/18/17   Tawni Millers, MD    Allergies Patient has no known allergies.  Family History    Problem Relation Age of Onset  . Alcohol abuse Father     Social History Social History   Tobacco Use  . Smoking status: Former Research scientist (life sciences)  . Smokeless tobacco: Never Used  . Tobacco comment: quit 30 years ago  Substance Use Topics  . Alcohol use: Yes    Alcohol/week: 4.2 oz    Types: 7 Glasses of wine per week    Comment: Pt states, "I quit all the hard stuff 4 months ago"  . Drug use: No    Review of Systems  Constitutional:sweats and shakiness Eyes: No visual changes. ENT: No sore throat. Cardiovascular: Denies chest pain. Respiratory: Denies shortness of breath. Gastrointestinal: abdominal pain.  No nausea, no vomiting.  No diarrhea.  No constipation. Genitourinary: Negative for dysuria. Musculoskeletal:  back pain. Skin: Negative for rash. Neurological: Negative for headaches, focal weakness or numbness.   ____________________________________________   PHYSICAL EXAM:  VITAL SIGNS: ED Triage Vitals  Enc Vitals Group     BP 01/17/18 2003 (!) 184/88     Pulse Rate 01/17/18 2003 78     Resp 01/17/18 2003 20     Temp 01/17/18 2003 98.5 F (36.9 C)     Temp Source 01/17/18 2003 Oral     SpO2 01/17/18 2003 100 %     Weight 01/17/18 2004 225 lb (102.1 kg)     Height 01/17/18 2004 6' (1.829 m)     Head Circumference --      Peak Flow --      Pain Score 01/17/18 2003 0     Pain Loc --      Pain Edu? --      Excl. in [place]? --     Constitutional: Alert and oriented. Well appearing and in mild distress. Eyes: Conjunctivae are normal. PERRL. EOMI. Head: Atraumatic. Nose: No congestion/rhinnorhea. Mouth/Throat: Mucous membranes are moist.  Oropharynx non-erythematous. Cardiovascular: Normal rate, regular rhythm. Grossly normal heart sounds.  Good peripheral circulation. Respiratory: Normal respiratory effort.  No retractions. Lungs CTAB. Gastrointestinal: Soft with some mild epigastric tenderness to palpation. No distention. Positive bowel sounds Musculoskeletal:  No midline spinal tenderness to palpation.  Patient has some tenderness in the paraspinous and sciatic nerve areas of his back.  Patient also has pain with movement. Neurologic:  Normal speech and language.  Skin:  Skin is warm, dry and intact.  Psychiatric: Mood and affect are normal.  ____________________________________________   LABS (all labs ordered are listed, but only abnormal results are displayed)  Labs Reviewed  COMPREHENSIVE METABOLIC PANEL - Abnormal; Notable for the following components:      Result Value   Glucose, Bld 105 (*)    Calcium 8.8 (*)    Total Protein  6.4 (*)    ALT 11 (*)    All other components within normal limits  CBC - Abnormal; Notable for the following components:   RBC 3.44 (*)    Hemoglobin 9.0 (*)    HCT 27.8 (*)    RDW 18.0 (*)    All other components within normal limits  URINE DRUG SCREEN, QUALITATIVE ([facility] ONLY) - Abnormal; Notable for the following components:   Cannabinoid 50 Ng, Ur [place] POSITIVE (*)    All other components within normal limits  ETHANOL  TROPONIN I  LIPASE, BLOOD  TROPONIN I   ____________________________________________  EKG  ED ECG REPORT I, Loney Hering, the attending physician, personally viewed and interpreted this ECG.   Date: 01/17/2018  EKG Time: 2014  Rate: 91  Rhythm: normal sinus rhythm  Axis: normal  Intervals:none  ST&T Change: none  ____________________________________________  RADIOLOGY  ED MD interpretation:  none  Official radiology report(s): No results found.  ____________________________________________   PROCEDURES  Procedure(s) performed: please, see procedure note(s).  .Suture Removal Date/Time: 01/18/2018 12:15 AM Performed by: Loney Hering, MD Authorized by: Loney Hering, MD   Consent:    Consent obtained:  Verbal   Consent given by:  Patient   Risks discussed:  Bleeding and pain   Alternatives discussed:  No treatment and delayed  treatment Location:    Location:  Lower extremity   Lower extremity location:  Leg   Leg location:  R lower leg Procedure details:    Wound appearance:  No signs of infection and nontender   Number of sutures removed:  5 Post-procedure details:    Post-removal:  Antibiotic ointment applied   Patient tolerance of procedure:  Tolerated well, no immediate complications    Critical Care performed: No  ____________________________________________   INITIAL IMPRESSION / ASSESSMENT AND PLAN / ED COURSE  As part of my medical decision making, I reviewed the following data within the electronic MEDICAL RECORD NUMBER Notes from prior ED visits and [place] Controlled Substance Database   This is a 57 year old male who comes into the hospital today with some back pain and shakes.  The patient has a history of alcoholism.    My differential diagnosis includes alcohol withdrawal, musculoskeletal back pain, pancreatitis, AAA.   Given the patient's history we did check some blood work to include a CBC, CMP, ethanol and troponin.  I did also add on a lipase and a repeat troponin onto the patient's blood work.  The patient then also stated that he had some sutures placed almost a month ago that he was supposed to have removed but he has not been able to make an appointment with the orthopedic surgeon as he was instructed.  I did give the patient a shot of Toradol as well as some Librium for his shakiness.  The patient did drink a few days ago but his ethanol is negative.  I also had a Lidoderm patch placed to the patient's back.  I feel that this is musculoskeletal pain as he said that he did have some similar pain a month ago.  The patient is also had CTs which did not show any aneurysmal dilatation of his aorta.  The patient's pain is also reproducible.  I will have the [facility] and I will discharge him with a Librium taper to help with his withdrawals.  The patient will be  discharged home.      ____________________________________________   FINAL  CLINICAL IMPRESSION(S) / ED DIAGNOSES  Final diagnoses:  Bilateral low back pain without sciatica, unspecified chronicity  Anxiety  Alcohol withdrawal syndrome without complication ([place])  Visit for suture removal     ED Discharge Orders        Ordered    chlordiazePOXIDE (LIBRIUM) 25 MG capsule     01/18/18 0024    lidocaine (LIDODERM) 5 %  Every 12 hours     01/18/18 0024       Note:  This document was prepared using Dragon voice recognition software and may include unintentional dictation errors.    Loney Hering, MD 01/18/18 [phone]

## 2018-01-18 NOTE — Discharge Instructions (Signed)
Please follow-up with the acute care clinic or with [facility].  Please take your medications and please refrain from drinking.

## 2018-01-18 NOTE — ED Notes (Signed)

## 2018-01-28 ENCOUNTER — Encounter (INDEPENDENT_AMBULATORY_CARE_PROVIDER_SITE_OTHER): Payer: Self-pay | Admitting: Vascular Surgery

## 2018-02-12 ENCOUNTER — Emergency Department: Payer: Medicaid Other

## 2018-02-12 ENCOUNTER — Other Ambulatory Visit: Payer: Self-pay

## 2018-02-12 ENCOUNTER — Encounter: Payer: Self-pay | Admitting: *Deleted

## 2018-02-12 ENCOUNTER — Emergency Department
Admission: EM | Admit: 2018-02-12 | Discharge: 2018-02-12 | Disposition: A | Discharge status: Home / Self Care | Payer: Medicaid Other | Attending: Emergency Medicine | Admitting: Emergency Medicine

## 2018-02-12 DIAGNOSIS — Y939 Activity, unspecified: Secondary | ICD-10-CM | POA: Insufficient documentation

## 2018-02-12 DIAGNOSIS — Y998 Other external cause status: Secondary | ICD-10-CM | POA: Insufficient documentation

## 2018-02-12 DIAGNOSIS — S0990XA Unspecified injury of head, initial encounter: Secondary | ICD-10-CM | POA: Insufficient documentation

## 2018-02-12 DIAGNOSIS — W01198A Fall on same level from slipping, tripping and stumbling with subsequent striking against other object, initial encounter: Secondary | ICD-10-CM | POA: Insufficient documentation

## 2018-02-12 DIAGNOSIS — W19XXXA Unspecified fall, initial encounter: Secondary | ICD-10-CM

## 2018-02-12 DIAGNOSIS — F1010 Alcohol abuse, uncomplicated: Secondary | ICD-10-CM | POA: Insufficient documentation

## 2018-02-12 DIAGNOSIS — Z87891 Personal history of nicotine dependence: Secondary | ICD-10-CM | POA: Insufficient documentation

## 2018-02-12 DIAGNOSIS — Z79899 Other long term (current) drug therapy: Secondary | ICD-10-CM | POA: Insufficient documentation

## 2018-02-12 DIAGNOSIS — I10 Essential (primary) hypertension: Secondary | ICD-10-CM | POA: Insufficient documentation

## 2018-02-12 DIAGNOSIS — J45909 Unspecified asthma, uncomplicated: Secondary | ICD-10-CM | POA: Insufficient documentation

## 2018-02-12 DIAGNOSIS — Y92000 Kitchen of unspecified non-institutional (private) residence as  the place of occurrence of the external cause: Secondary | ICD-10-CM | POA: Insufficient documentation

## 2018-02-12 LAB — COMPREHENSIVE METABOLIC PANEL
ALT: 13 U/L — ABNORMAL LOW (ref 17–63)
AST: 15 U/L (ref 15–41)
Albumin: 3.8 g/dL (ref 3.5–5.0)
Alkaline Phosphatase: 81 U/L (ref 38–126)
Anion gap: 10 (ref 5–15)
BUN: 13 mg/dL (ref 6–20)
CO2: 23 mmol/L (ref 22–32)
Calcium: 8.6 mg/dL — ABNORMAL LOW (ref 8.9–10.3)
Chloride: 112 mmol/L — ABNORMAL HIGH (ref 101–111)
Creatinine, Ser: 0.91 mg/dL (ref 0.61–1.24)
GFR calc Af Amer: >60 mL/min (ref >60)
GFR calc non Af Amer: >60 mL/min (ref >60)
Glucose, Bld: 117 mg/dL — ABNORMAL HIGH (ref 65–99)
Potassium: 3.6 mmol/L (ref 3.5–5.1)
Sodium: 145 mmol/L (ref 135–145)
Total Bilirubin: 0.6 mg/dL (ref 0.3–1.2)
Total Protein: 6.9 g/dL (ref 6.5–8.1)

## 2018-02-12 LAB — URINALYSIS, COMPLETE (UACMP) WITH MICROSCOPIC
Bacteria, UA: NONE SEEN
Bilirubin Urine: NEGATIVE
Glucose, UA: NEGATIVE mg/dL
Hgb urine dipstick: NEGATIVE
Ketones, ur: NEGATIVE mg/dL
Leukocytes, UA: NEGATIVE
Nitrite: NEGATIVE
Protein, ur: NEGATIVE mg/dL
Specific Gravity, Urine: 1.009 (ref 1.005–1.030)
pH: 6 (ref 5.0–8.0)

## 2018-02-12 LAB — CBC
HCT: 31.4 % — ABNORMAL LOW (ref 40.0–52.0)
Hemoglobin: 9.7 g/dL — ABNORMAL LOW (ref 13.0–18.0)
MCH: 23.3 pg — ABNORMAL LOW (ref 26.0–34.0)
MCHC: 30.9 g/dL — ABNORMAL LOW (ref 32.0–36.0)
MCV: 75.6 fL — ABNORMAL LOW (ref 80.0–100.0)
Platelets: 186 10*3/uL (ref 150–440)
RBC: 4.15 MIL/uL — ABNORMAL LOW (ref 4.40–5.90)
RDW: 19.6 % — ABNORMAL HIGH (ref 11.5–14.5)
WBC: 3.7 10*3/uL — ABNORMAL LOW (ref 3.8–10.6)

## 2018-02-12 LAB — ETHANOL: Alcohol, Ethyl (B): 256 mg/dL — ABNORMAL HIGH (ref <10)

## 2018-02-12 MED ORDER — ACETAMINOPHEN 500 MG PO TABS
1000.0000 mg | ORAL_TABLET | Freq: Once | ORAL | Status: AC
  Administered 2018-02-12: 1000 mg via ORAL
  Filled 2018-02-12: qty 2

## 2018-02-12 NOTE — ED Triage Notes (Signed)
Pt is a frequent pt here at ED. Tonight he had 4 wine coolers and fell and hit his head. And yesterday he also fell outside also a result of drinking. He is tearful and depressed about family situation

## 2018-02-12 NOTE — ED Triage Notes (Signed)
Pt states he has fallen about 10-20 times in the past month

## 2018-02-12 NOTE — ED Provider Notes (Signed)
[facility] Emergency Department Provider Note  ____________________________________________  Time seen: Approximately 10:39 PM  I have reviewed the triage vital signs and the nursing notes.   HISTORY  Chief Complaint Fall   HPI Miguel [name] is a 57 y.o. male with a history of alcohol abuse, depression, hypertension, OCD who presents for evaluation of fall.  Patient reports that he has had several falls over the last few days.  One yesterday and hit the back of his head onto the concrete and one this evening inside his kitchen.  He denies LOC.  He is not on blood thinners.  He is complaining of pain that has been constant, sharp, nonradiating located in the back of his head.  No neck pain, no back pain, no chest pain, no abdominal pain, no nausea, vomiting, diarrhea, or fever.  Patient reports that he had 2 large wine coolers this evening.  He reports that he lives in a house that was built in [place] and the floor is very uneven which prompts his several falls.  Past Medical History:  Diagnosis Date  . Alcohol abuse   . Asthma   . GERD (gastroesophageal reflux disease)   . Gout   . Hypertension   . OCD (obsessive compulsive disorder)   . Renal colic     Patient Active Problem List   Diagnosis Date Noted  . Leg hematoma 12/25/2017  . Suicide and self-inflicted injury by cutting and piercing instrument ([place]) 03/10/2017  . Severe recurrent major 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 ([place]) 03/09/2017  . Substance induced mood disorder ([place]) 08/15/2016  . Involuntary commitment 08/15/2016  . Alcohol use disorder, severe, dependence ([place]) 02/05/2016  . Hypertension 12/05/2015  . Tachycardia 12/05/2015  . Gout 11/13/2015  . Chronic back pain 05/02/2015    Past Surgical History:  Procedure Laterality Date  . CHOLECYSTECTOMY  2012    Prior to Admission medications   Medication Sig Start Date End Date Taking? Authorizing Provider  ADVAIR DISKUS 100-50  MCG/DOSE AEPB INHALE 1 PUFF 2 TIMES A DAY. RINSE MOUTH AND SPIT AFTER EACH USE. 10/18/17   Tawni Millers, MD  allopurinol (ZYLOPRIM) 300 MG tablet Take 300 mg by mouth daily.    [provider]  chlordiazePOXIDE (LIBRIUM) 25 MG capsule 50 mg 3 times a day x 3 days 50 mg 2 times a day for 3 days 50 mg once a day for 3 days stop 01/18/18   Loney Hering, MD  dexlansoprazole (DEXILANT) 60 MG capsule Take 60 mg by mouth every morning.    [provider]  ferrous sulfate 325 (65 FE) MG tablet Take 1 tablet (325 mg total) by mouth 2 (two) times daily with a meal. 12/28/17   Gladstone Lighter, MD  HYDROcodone-acetaminophen (NORCO/VICODIN) 5-325 MG tablet Take 1-2 tablets by mouth every 4 (four) hours as needed for moderate pain or severe pain. 12/28/17   Gladstone Lighter, MD  lidocaine (LIDODERM) 5 % Place 1 patch onto the skin every 12 (twelve) hours. Remove & Discard patch within 12 hours or as directed by MD 01/18/18 01/18/19  Loney Hering, MD  lisinopril (PRINIVIL,ZESTRIL) 20 MG tablet Take 1 tablet (20 mg total) by mouth every morning. 12/28/17   Gladstone Lighter, MD  tamsulosin (FLOMAX) 0.4 MG CAPS capsule Take 1 capsule (0.4 mg total) by mouth daily. 12/28/17   Gladstone Lighter, MD  traZODone (DESYREL) 50 MG tablet Take 1 tablet (50 mg total) by mouth at bedtime as needed for sleep.  12/28/17   Gladstone Lighter, MD  VENTOLIN HFA 108 (90 Base) MCG/ACT inhaler INHALE 2 PUFFS EVERY 4 HOURS AS NEEDED 10/18/17   Tawni Millers, MD    Allergies Patient has no known allergies.  Family History  Problem Relation Age of Onset  . Alcohol abuse Father     Social History Social History   Tobacco Use  . Smoking status: Former Research scientist (life sciences)  . Smokeless tobacco: Never Used  . Tobacco comment: quit 30 years ago  Substance Use Topics  . Alcohol use: Yes    Alcohol/week: 4.2 oz    Types: 7 Glasses of wine per week    Comment: Pt states, "I quit all the hard stuff 4 months ago"  .  Drug use: No    Review of Systems Constitutional: Negative for fever. Eyes: Negative for visual changes. ENT: Negative for facial injury or neck injury Cardiovascular: Negative for chest injury. Respiratory: Negative for shortness of breath. Negative for chest wall injury. Gastrointestinal: Negative for abdominal pain or injury. Genitourinary: Negative for dysuria. Musculoskeletal: Negative for back injury, negative for arm or leg pain. Skin: Negative for laceration/abrasions. Neurological: + head injury.   ____________________________________________   PHYSICAL EXAM:  VITAL SIGNS: ED Triage Vitals  Enc Vitals Group     BP 02/12/18 2030 (!) 141/93     Pulse Rate 02/12/18 2030 100     Resp --      Temp --      Temp src --      SpO2 02/12/18 2012 98 %     Weight 02/12/18 2016 225 lb (102.1 kg)     Height 02/12/18 2016 6' (1.829 m)     Head Circumference --      Peak Flow --      Pain Score --      Pain Loc --      Pain Edu? --      Excl. in [place]? --     Constitutional: Alert and oriented. No acute distress. Does not appear intoxicated. HEENT Head: Normocephalic and atraumatic. Face: No facial bony tenderness. Stable midface Ears: No hemotympanum bilaterally. No Battle sign Eyes: No eye injury. PERRL. No raccoon eyes Nose: Nontender. No epistaxis. No rhinorrhea Mouth/Throat: Mucous membranes are moist. No oropharyngeal blood. No dental injury. Airway patent without stridor. Normal voice. Neck: no C-collar in place. No midline c-spine tenderness.  Cardiovascular: Normal rate, regular rhythm. Normal and symmetric distal pulses are present in all extremities. Pulmonary/Chest: Chest wall is stable and nontender to palpation/compression. Normal respiratory effort. Breath sounds are normal. No crepitus.  Abdominal: Soft, nontender, non distended. Musculoskeletal: Nontender with normal full range of motion in all extremities. No deformities. No thoracic or lumbar midline  spinal tenderness. Pelvis is stable. Skin: Skin is warm, dry and intact. No abrasions or contutions. Psychiatric: Speech and behavior are appropriate. Neurological: Normal speech and language. Moves all extremities to command. No gross focal neurologic deficits are appreciated.  Glascow Coma Score: 4 - Opens eyes on own 6 - Follows simple motor commands 5 - Alert and oriented GCS: 15  ____________________________________________   LABS (all labs ordered are listed, but only abnormal results are displayed)  Labs Reviewed  CBC - Abnormal; Notable for the following components:      Result Value   WBC 3.7 (*)    RBC 4.15 (*)    Hemoglobin 9.7 (*)    HCT 31.4 (*)    MCV 75.6 (*)    MCH 23.3 (*)  MCHC 30.9 (*)    RDW 19.6 (*)    All other components within normal limits  COMPREHENSIVE METABOLIC PANEL - Abnormal; Notable for the following components:   Chloride 112 (*)    Glucose, Bld 117 (*)    Calcium 8.6 (*)    ALT 13 (*)    All other components within normal limits  URINALYSIS, COMPLETE (UACMP) WITH MICROSCOPIC - Abnormal; Notable for the following components:   Color, Urine YELLOW (*)    APPearance CLEAR (*)    Squamous Epithelial / LPF 0-5 (*)    All other components within normal limits  ETHANOL - Abnormal; Notable for the following components:   Alcohol, Ethyl (B) 256 (*)    All other components within normal limits   ____________________________________________  EKG  ED ECG REPORT I, Rudene Re, the attending physician, personally viewed and interpreted this ECG.  Normal sinus rhythm, rate of 96, normal intervals, normal axis, no ST elevations or depressions, Q waves and lateral leads.  Unchanged from prior. ____________________________________________  RADIOLOGY  I have personally reviewed the images performed during this visit and I agree with the Radiologist's read.   Interpretation by Radiologist:  Ct Head Wo Contrast  Result Date:  02/12/2018 CLINICAL DATA:  57 year old male with head trauma. EXAM: CT HEAD WITHOUT CONTRAST CT CERVICAL SPINE WITHOUT CONTRAST TECHNIQUE: Multidetector CT imaging of the head and cervical spine was performed following the standard protocol without intravenous contrast. Multiplanar CT image reconstructions of the cervical spine were also generated. COMPARISON:  Head CT dated 09/13/2017 FINDINGS: CT HEAD FINDINGS Brain: There is mild age-related atrophy and chronic microvascular ischemic changes. There is no acute intracranial hemorrhage. No mass effect or midline shift. No extra-axial fluid collection. Vascular: No hyperdense vessel or unexpected calcification. Skull: Normal. Negative for fracture or focal lesion. Sinuses/Orbits: Mild mucoperiosteal thickening of paranasal sinuses. No air-fluid levels. The mastoid air cells are clear. Other: None CT CERVICAL SPINE FINDINGS Alignment: Normal. Skull base and vertebrae: No acute fracture. No primary bone lesion or focal pathologic process. Soft tissues and spinal canal: No prevertebral fluid or swelling. No visible canal hematoma. Disc levels:  Mild degenerative changes at C6-C7. Upper chest: Negative. Other: None IMPRESSION: 1. No acute intracranial pathology. 2. No acute/traumatic cervical spine pathology. Electronically Signed   By: Anner Crete M.D.   On: 02/12/2018 21:55   Ct Cervical Spine Wo Contrast  Result Date: 02/12/2018 CLINICAL DATA:  57 year old male with head trauma. EXAM: CT HEAD WITHOUT CONTRAST CT CERVICAL SPINE WITHOUT CONTRAST TECHNIQUE: Multidetector CT imaging of the head and cervical spine was performed following the standard protocol without intravenous contrast. Multiplanar CT image reconstructions of the cervical spine were also generated. COMPARISON:  Head CT dated 09/13/2017 FINDINGS: CT HEAD FINDINGS Brain: There is mild age-related atrophy and chronic microvascular ischemic changes. There is no acute intracranial hemorrhage. No  mass effect or midline shift. No extra-axial fluid collection. Vascular: No hyperdense vessel or unexpected calcification. Skull: Normal. Negative for fracture or focal lesion. Sinuses/Orbits: Mild mucoperiosteal thickening of paranasal sinuses. No air-fluid levels. The mastoid air cells are clear. Other: None CT CERVICAL SPINE FINDINGS Alignment: Normal. Skull base and vertebrae: No acute fracture. No primary bone lesion or focal pathologic process. Soft tissues and spinal canal: No prevertebral fluid or swelling. No visible canal hematoma. Disc levels:  Mild degenerative changes at C6-C7. Upper chest: Negative. Other: None IMPRESSION: 1. No acute intracranial pathology. 2. No acute/traumatic cervical spine pathology. Electronically Signed   By: Milas Hock  Radparvar M.D.   On: 02/12/2018 21:55     ____________________________________________   PROCEDURES  Procedure(s) performed: None Procedures Critical Care performed:  None ____________________________________________   INITIAL IMPRESSION / ASSESSMENT AND PLAN / ED COURSE  57 y.o. male with a history of alcohol abuse, depression, hypertension, OCD who presents for evaluation of fall.  CT head and neck were done with no evidence of acute pathology.  No signs or symptoms of basilar skull fracture.  Even though patient's alcohol level is 256, patient looks clinically sober.  His labs are unchanged from his baseline.  There is no acute findings such as worsening anemia, dehydration, UTI.  Discussed with patient importance of not drinking especially since patient describes having very uneven floors.  He does have a walker at home which I encouraged him to use.  I will refer him to [facility] for outpatient treatment for his alcohol abuse.  Discussed return precautions with patient.  At this time patient will be discharged home in a taxi has been called to take him home.      As part of my medical decision making, I reviewed the following data within the  [place] notes reviewed and incorporated, Labs reviewed , EKG interpreted , Old EKG reviewed, Radiograph reviewed  Notes from prior ED visits and [place] Controlled Substance Database    Pertinent labs & imaging results that were available during my care of the patient were reviewed by me and considered in my medical decision making (see chart for details).    ____________________________________________   FINAL CLINICAL IMPRESSION(S) / ED DIAGNOSES  Final diagnoses:  Fall, initial encounter  Alcohol abuse      NEW MEDICATIONS STARTED DURING THIS VISIT:  ED Discharge Orders    None       Note:  This document was prepared using Dragon voice recognition software and may include unintentional dictation errors.    Rudene Re, MD 02/12/18 [phone]

## 2018-02-12 NOTE — Discharge Instructions (Addendum)

## 2018-03-02 ENCOUNTER — Encounter: Payer: Self-pay | Admitting: Emergency Medicine

## 2018-03-02 ENCOUNTER — Other Ambulatory Visit: Payer: Self-pay

## 2018-03-02 ENCOUNTER — Emergency Department: Payer: Self-pay

## 2018-03-02 ENCOUNTER — Emergency Department
Admission: EM | Admit: 2018-03-02 | Discharge: 2018-03-02 | Disposition: A | Discharge status: Home / Self Care | Payer: Self-pay | Attending: Emergency Medicine | Admitting: Emergency Medicine

## 2018-03-02 DIAGNOSIS — I10 Essential (primary) hypertension: Secondary | ICD-10-CM | POA: Insufficient documentation

## 2018-03-02 DIAGNOSIS — J45909 Unspecified asthma, uncomplicated: Secondary | ICD-10-CM | POA: Insufficient documentation

## 2018-03-02 DIAGNOSIS — M545 Low back pain, unspecified: Secondary | ICD-10-CM

## 2018-03-02 DIAGNOSIS — Z79899 Other long term (current) drug therapy: Secondary | ICD-10-CM | POA: Insufficient documentation

## 2018-03-02 DIAGNOSIS — F10939 Alcohol use, unspecified with withdrawal, unspecified: Secondary | ICD-10-CM

## 2018-03-02 DIAGNOSIS — R1030 Lower abdominal pain, unspecified: Secondary | ICD-10-CM | POA: Insufficient documentation

## 2018-03-02 DIAGNOSIS — Z87891 Personal history of nicotine dependence: Secondary | ICD-10-CM | POA: Insufficient documentation

## 2018-03-02 DIAGNOSIS — F10239 Alcohol dependence with withdrawal, unspecified: Secondary | ICD-10-CM | POA: Insufficient documentation

## 2018-03-02 DIAGNOSIS — R109 Unspecified abdominal pain: Secondary | ICD-10-CM

## 2018-03-02 DIAGNOSIS — K922 Gastrointestinal hemorrhage, unspecified: Secondary | ICD-10-CM | POA: Insufficient documentation

## 2018-03-02 LAB — COMPREHENSIVE METABOLIC PANEL
ALT: 11 U/L — ABNORMAL LOW (ref 17–63)
AST: 17 U/L (ref 15–41)
Albumin: 3.9 g/dL (ref 3.5–5.0)
Alkaline Phosphatase: 74 U/L (ref 38–126)
Anion gap: 9 (ref 5–15)
BUN: 12 mg/dL (ref 6–20)
CO2: 24 mmol/L (ref 22–32)
Calcium: 9 mg/dL (ref 8.9–10.3)
Chloride: 105 mmol/L (ref 101–111)
Creatinine, Ser: 0.86 mg/dL (ref 0.61–1.24)
GFR calc Af Amer: >60 mL/min (ref >60)
GFR calc non Af Amer: >60 mL/min (ref >60)
Glucose, Bld: 114 mg/dL — ABNORMAL HIGH (ref 65–99)
Potassium: 3.4 mmol/L — ABNORMAL LOW (ref 3.5–5.1)
Sodium: 138 mmol/L (ref 135–145)
Total Bilirubin: 0.4 mg/dL (ref 0.3–1.2)
Total Protein: 7.2 g/dL (ref 6.5–8.1)

## 2018-03-02 LAB — URINALYSIS, COMPLETE (UACMP) WITH MICROSCOPIC
Bacteria, UA: NONE SEEN
Bilirubin Urine: NEGATIVE
Glucose, UA: NEGATIVE mg/dL
Hgb urine dipstick: NEGATIVE
Ketones, ur: NEGATIVE mg/dL
Nitrite: NEGATIVE
Protein, ur: 30 mg/dL — AB
Specific Gravity, Urine: 1.017 (ref 1.005–1.030)
pH: 6 (ref 5.0–8.0)

## 2018-03-02 LAB — CBC WITH DIFFERENTIAL/PLATELET
Basophils Absolute: 0.1 10*3/uL (ref 0–0.1)
Basophils Relative: 2 %
Eosinophils Absolute: 0.1 10*3/uL (ref 0–0.7)
Eosinophils Relative: 2 %
HCT: 34.1 % — ABNORMAL LOW (ref 40.0–52.0)
Hemoglobin: 10.7 g/dL — ABNORMAL LOW (ref 13.0–18.0)
Lymphocytes Relative: 36 %
Lymphs Abs: 1.9 10*3/uL (ref 1.0–3.6)
MCH: 23.1 pg — ABNORMAL LOW (ref 26.0–34.0)
MCHC: 31.5 g/dL — ABNORMAL LOW (ref 32.0–36.0)
MCV: 73.2 fL — ABNORMAL LOW (ref 80.0–100.0)
Monocytes Absolute: 0.7 10*3/uL (ref 0.2–1.0)
Monocytes Relative: 13 %
Neutro Abs: 2.6 10*3/uL (ref 1.4–6.5)
Neutrophils Relative %: 47 %
Platelets: 254 10*3/uL (ref 150–440)
RBC: 4.65 MIL/uL (ref 4.40–5.90)
RDW: 19.2 % — ABNORMAL HIGH (ref 11.5–14.5)
WBC: 5.3 10*3/uL (ref 3.8–10.6)

## 2018-03-02 LAB — ETHANOL: Alcohol, Ethyl (B): <10 mg/dL (ref <10)

## 2018-03-02 LAB — PROTIME-INR
INR: 1.07
Prothrombin Time: 13.8 seconds (ref 11.4–15.2)

## 2018-03-02 LAB — LIPASE, BLOOD: Lipase: 39 U/L (ref 11–51)

## 2018-03-02 MED ORDER — CHLORDIAZEPOXIDE HCL 25 MG PO CAPS
50.0000 mg | ORAL_CAPSULE | Freq: Once | ORAL | Status: AC
  Administered 2018-03-02: 50 mg via ORAL
  Filled 2018-03-02: qty 2

## 2018-03-02 MED ORDER — OMEPRAZOLE 40 MG PO CPDR: 40.0000 mg | DELAYED_RELEASE_CAPSULE | Freq: Every day | ORAL | 0 refills | Status: DC

## 2018-03-02 MED ORDER — IOPAMIDOL (ISOVUE-300) INJECTION 61%
100.0000 mL | Freq: Once | INTRAVENOUS | Status: AC | PRN
  Administered 2018-03-02: 100 mL via INTRAVENOUS

## 2018-03-02 MED ORDER — SODIUM CHLORIDE 0.9 % IV BOLUS
1000.0000 mL | Freq: Once | INTRAVENOUS | Status: AC
  Administered 2018-03-02: 1000 mL via INTRAVENOUS

## 2018-03-02 MED ORDER — GI COCKTAIL ~~LOC~~
30.0000 mL | Freq: Once | ORAL | Status: AC
Start: 2018-03-02 — End: 2018-03-02
  Administered 2018-03-02: 30 mL via ORAL
  Filled 2018-03-02: qty 30

## 2018-03-02 MED ORDER — HYDROCODONE-ACETAMINOPHEN 5-325 MG PO TABS: 1.0000 | ORAL_TABLET | Freq: Four times a day (QID) | ORAL | 0 refills | Status: DC | PRN

## 2018-03-02 MED ORDER — FAMOTIDINE IN NACL 20-0.9 MG/50ML-% IV SOLN
20.0000 mg | Freq: Once | INTRAVENOUS | Status: AC
  Administered 2018-03-02: 20 mg via INTRAVENOUS
  Filled 2018-03-02: qty 50

## 2018-03-02 MED ORDER — HYDROCODONE-ACETAMINOPHEN 5-325 MG PO TABS
1.0000 | ORAL_TABLET | Freq: Once | ORAL | Status: AC
  Administered 2018-03-02: 1 via ORAL
  Filled 2018-03-02: qty 1

## 2018-03-02 NOTE — ED Provider Notes (Signed)
[facility] Emergency Department Provider Note  ____________________________________________   First MD Initiated Contact with Patient 03/02/18 [phone]     (approximate)  I have reviewed the triage vital signs and the nursing notes.   HISTORY  Chief Complaint Back Pain   HPI Miguel MCMANAMON is a 57 y.o. male is self presents to the emergency department with multiple complaints.  He reports mild to moderate cramping lower abdominal discomfort along with one episode of bright red blood per rectum today.  He also reports some urinary urgency although no dysuria.  He reports mild throbbing aching low back pain.  The pain is nonradiating.  Seems to be somewhat worse with walking and somewhat improved with rest.  No numbness or weakness.  He also reports feeling somewhat "anxious" and "shaky".  He is an alcoholic and drinks daily.  His last drink was around noon.  He said he does get the "shakes" when he stops drinking.  Past Medical History:  Diagnosis Date  . Alcohol abuse   . Asthma   . GERD (gastroesophageal reflux disease)   . Gout   . Hypertension   . OCD (obsessive compulsive disorder)   . Renal colic     Patient Active Problem List   Diagnosis Date Noted  . Leg hematoma 12/25/2017  . Suicide and self-inflicted injury by cutting and piercing instrument ([place]) 03/10/2017  . Severe recurrent major 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 ([place]) 03/09/2017  . Substance induced mood disorder ([place]) 08/15/2016  . Involuntary commitment 08/15/2016  . Alcohol use disorder, severe, dependence ([place]) 02/05/2016  . Hypertension 12/05/2015  . Tachycardia 12/05/2015  . Gout 11/13/2015  . Chronic back pain 05/02/2015    Past Surgical History:  Procedure Laterality Date  . CHOLECYSTECTOMY  2012    Prior to Admission medications   Medication Sig Start Date End Date Taking? Authorizing Provider  ADVAIR DISKUS 100-50 MCG/DOSE AEPB INHALE 1 PUFF 2 TIMES A DAY. RINSE  MOUTH AND SPIT AFTER EACH USE. 10/18/17   Tawni Millers, MD  allopurinol (ZYLOPRIM) 300 MG tablet Take 300 mg by mouth daily.    [provider]  chlordiazePOXIDE (LIBRIUM) 25 MG capsule 50 mg 3 times a day x 3 days 50 mg 2 times a day for 3 days 50 mg once a day for 3 days stop 01/18/18   Loney Hering, MD  dexlansoprazole (DEXILANT) 60 MG capsule Take 60 mg by mouth every morning.    [provider]  ferrous sulfate 325 (65 FE) MG tablet Take 1 tablet (325 mg total) by mouth 2 (two) times daily with a meal. 12/28/17   Gladstone Lighter, MD  HYDROcodone-acetaminophen (NORCO) 5-325 MG tablet Take 1 tablet by mouth every 6 (six) hours as needed for up to 7 doses for severe pain. 03/02/18   Darel Hong, MD  lidocaine (LIDODERM) 5 % Place 1 patch onto the skin every 12 (twelve) hours. Remove & Discard patch within 12 hours or as directed by MD 01/18/18 01/18/19  Loney Hering, MD  lisinopril (PRINIVIL,ZESTRIL) 20 MG tablet Take 1 tablet (20 mg total) by mouth every morning. 12/28/17   Gladstone Lighter, MD  omeprazole (PRILOSEC) 40 MG capsule Take 1 capsule (40 mg total) by mouth daily. 03/02/18 03/02/19  Darel Hong, MD  tamsulosin (FLOMAX) 0.4 MG CAPS capsule Take 1 capsule (0.4 mg total) by mouth daily. 12/28/17   Gladstone Lighter, MD  traZODone (DESYREL) 50 MG tablet Take 1 tablet (50 mg total) by  mouth at bedtime as needed for sleep. 12/28/17   Gladstone Lighter, MD  VENTOLIN HFA 108 (90 Base) MCG/ACT inhaler INHALE 2 PUFFS EVERY 4 HOURS AS NEEDED 10/18/17   Tawni Millers, MD    Allergies Patient has no known allergies.  Family History  Problem Relation Age of Onset  . Alcohol abuse Father     Social History Social History   Tobacco Use  . Smoking status: Former Research scientist (life sciences)  . Smokeless tobacco: Never Used  . Tobacco comment: quit 30 years ago  Substance Use Topics  . Alcohol use: Yes    Alcohol/week: 4.2 oz    Types: 7 Glasses of wine per week    Comment:  Pt states, "I quit all the hard stuff 4 months ago"  . Drug use: No    Review of Systems Constitutional: No fever/chills Eyes: No visual changes. ENT: No sore throat. Cardiovascular: Denies chest pain. Respiratory: Denies shortness of breath. Gastrointestinal: Positive for abdominal pain.  No nausea, no vomiting.  No diarrhea.  No constipation. Genitourinary: Positive for urgency Musculoskeletal: Positive for back pain. Skin: Negative for rash. Neurological: Negative for headaches, focal weakness or numbness.   ____________________________________________   PHYSICAL EXAM:  VITAL SIGNS: ED Triage Vitals  Enc Vitals Group     BP 03/02/18 0016 (!) 143/95     Pulse Rate 03/02/18 0016 (!) 108     Resp 03/02/18 0016 16     Temp 03/02/18 0016 99 F (37.2 C)     Temp Source 03/02/18 0016 Oral     SpO2 03/02/18 0016 99 %     Weight 03/02/18 0016 225 lb (102.1 kg)     Height 03/02/18 0016 6' (1.829 m)     Head Circumference --      Peak Flow --      Pain Score 03/02/18 0019 8     Pain Loc --      Pain Edu? --      Excl. in [place]? --     Constitutional: Alert and oriented x4 somewhat anxious appearing nontoxic no diaphoresis speaks in full clear sentences Eyes: PERRL EOMI. mid range and brisk Head: Atraumatic. Nose: No congestion/rhinnorhea. Mouth/Throat: No trismus positive for mild tongue fasciculations Neck: No stridor.   Cardiovascular: Tachycardic rate, regular rhythm. Grossly normal heart sounds.  Good peripheral circulation. Respiratory: Normal respiratory effort.  No retractions. Lungs CTAB and moving good air Gastrointestinal: Soft nondistended somewhat tender left greater than right lower quadrants with no rebound no guarding no peritonitis No external hemorrhoids appreciated guaiac negative control positive brown stool Musculoskeletal: No lower extremity edema mild hand tremors Neurologic:  Normal speech and language. No gross focal neurologic deficits are  appreciated. Skin:  Skin is warm, dry and intact. No rash noted. Psychiatric: Mood and affect are normal. Speech and behavior are normal.    ____________________________________________   DIFFERENTIAL includes but not limited to  Alcohol intoxication, alcohol withdrawal, diverticulitis, appendicitis, upper GI bleed ____________________________________________   LABS (all labs ordered are listed, but only abnormal results are displayed)  Labs Reviewed  URINALYSIS, COMPLETE (UACMP) WITH MICROSCOPIC - Abnormal; Notable for the following components:      Result Value   Color, Urine YELLOW (*)    APPearance HAZY (*)    Protein, ur 30 (*)    Leukocytes, UA MODERATE (*)    Squamous Epithelial / LPF 0-5 (*)    All other components within normal limits  COMPREHENSIVE METABOLIC PANEL - Abnormal; Notable for the following  components:   Potassium 3.4 (*)    Glucose, Bld 114 (*)    ALT 11 (*)    All other components within normal limits  CBC WITH DIFFERENTIAL/PLATELET - Abnormal; Notable for the following components:   Hemoglobin 10.7 (*)    HCT 34.1 (*)    MCV 73.2 (*)    MCH 23.1 (*)    MCHC 31.5 (*)    RDW 19.2 (*)    All other components within normal limits  URINE CULTURE  ETHANOL  PROTIME-INR  LIPASE, BLOOD    Lab work reviewed by me with leukocytes in the urine but no bacteria.  No other acute disease noted __________________________________________  EKG   ____________________________________________  RADIOLOGY  CT abdomen pelvis reviewed by me with no clear etiology of the patient's symptoms ____________________________________________   PROCEDURES  Procedure(s) performed: no  Procedures  Critical Care performed: no  Observation: no ____________________________________________   INITIAL IMPRESSION / ASSESSMENT AND PLAN / ED COURSE  Pertinent labs & imaging results that were available during my care of the patient were reviewed by me and considered in  my medical decision making (see chart for details).  The patient arrives with multiple complaints.  He has low back pain, abdominal pain, some GI bleed, as well as clear alcohol withdrawal.  Given 50 mg of chlordiazepoxide while labs and CT scan are pending.  Guaiac negative for now.  Following chlordiazepoxide the patient's tremors are nearly completely resolved and he feels "much more relaxed".  His abdomen is benign and is no longer in pain after GI cocktail.  I had a lengthy discussion with the patient regarding the importance of initiating a PPI and following up with his primary care physician for further workup.  He is discharged home in improved condition verbalizes understanding and agreement with the plan.      ____________________________________________   FINAL CLINICAL IMPRESSION(S) / ED DIAGNOSES  Final diagnoses:  Alcohol withdrawal syndrome with complication (HCC)  Abdominal pain, unspecified abdominal location  Acute low back pain without sciatica, unspecified back pain laterality  Gastrointestinal hemorrhage, unspecified gastrointestinal hemorrhage type      NEW MEDICATIONS STARTED DURING THIS VISIT:  Discharge Medication List as of 03/02/2018  6:33 AM    START taking these medications   Details  omeprazole (PRILOSEC) 40 MG capsule Take 1 capsule (40 mg total) by mouth daily., Starting Wed 03/02/2018, Until Thu 03/02/2019, Print         Note:  This document was prepared using Dragon voice recognition software and may include unintentional dictation errors.     Darel Hong, MD 03/02/18 [phone]

## 2018-03-02 NOTE — Discharge Instructions (Signed)
Please take your pain medication as needed for severe symptoms and follow-up with your primary care physician within 1 week for reevaluation.  Return to the emergency department sooner for any concerns whatsoever.  It was a pleasure to take care of you today, and thank you for coming to our emergency department.  If you have any questions or concerns before leaving please ask the nurse to grab me and I'm more than happy to go through your aftercare instructions again.  If you were prescribed any opioid pain medication today such as Norco, Vicodin, Percocet, morphine, hydrocodone, or oxycodone please make sure you do not drive when you are taking this medication as it can alter your ability to drive safely.  If you have any concerns once you are home that you are not improving or are in fact getting worse before you can make it to your follow-up appointment, please do not hesitate to call 911 and come back for further evaluation.  Darel Hong, MD  Results for orders placed or performed during the hospital encounter of 03/02/18  Urinalysis, Complete w Microscopic  Result Value Ref Range   Color, Urine YELLOW (A) YELLOW   APPearance HAZY (A) CLEAR   Specific Gravity, Urine 1.017 1.005 - 1.030   pH 6.0 5.0 - 8.0   Glucose, UA NEGATIVE NEGATIVE mg/dL   Hgb urine dipstick NEGATIVE NEGATIVE   Bilirubin Urine NEGATIVE NEGATIVE   Ketones, ur NEGATIVE NEGATIVE mg/dL   Protein, ur 30 (A) NEGATIVE mg/dL   Nitrite NEGATIVE NEGATIVE   Leukocytes, UA MODERATE (A) NEGATIVE   RBC / HPF 0-5 0 - 5 RBC/hpf   WBC, UA TOO NUMEROUS TO COUNT 0 - 5 WBC/hpf   Bacteria, UA NONE SEEN NONE SEEN   Squamous Epithelial / LPF 0-5 (A) NONE SEEN   Mucus PRESENT    Ca Oxalate Crys, UA PRESENT   Comprehensive metabolic panel  Result Value Ref Range   Sodium 138 135 - 145 mmol/L   Potassium 3.4 (L) 3.5 - 5.1 mmol/L   Chloride 105 101 - 111 mmol/L   CO2 24 22 - 32 mmol/L   Glucose, Bld 114 (H) 65 - 99 mg/dL   BUN  12 6 - 20 mg/dL   Creatinine, Ser 0.86 0.61 - 1.24 mg/dL   Calcium 9.0 8.9 - 10.3 mg/dL   Total Protein 7.2 6.5 - 8.1 g/dL   Albumin 3.9 3.5 - 5.0 g/dL   AST 17 15 - 41 U/L   ALT 11 (L) 17 - 63 U/L   Alkaline Phosphatase 74 38 - 126 U/L   Total Bilirubin 0.4 0.3 - 1.2 mg/dL   GFR calc non Af Amer >60 >60 mL/min   GFR calc Af Amer >60 >60 mL/min   Anion gap 9 5 - 15  Ethanol  Result Value Ref Range   Alcohol, Ethyl (B) <10 <10 mg/dL  CBC with Differential  Result Value Ref Range   WBC 5.3 3.8 - 10.6 K/uL   RBC 4.65 4.40 - 5.90 MIL/uL   Hemoglobin 10.7 (L) 13.0 - 18.0 g/dL   HCT 34.1 (L) 40.0 - 52.0 %   MCV 73.2 (L) 80.0 - 100.0 fL   MCH 23.1 (L) 26.0 - 34.0 pg   MCHC 31.5 (L) 32.0 - 36.0 g/dL   RDW 19.2 (H) 11.5 - 14.5 %   Platelets 254 150 - 440 K/uL   Neutrophils Relative % 47 %   Neutro Abs 2.6 1.4 - 6.5 K/uL   Lymphocytes  Relative 36 %   Lymphs Abs 1.9 1.0 - 3.6 K/uL   Monocytes Relative 13 %   Monocytes Absolute 0.7 0.2 - 1.0 K/uL   Eosinophils Relative 2 %   Eosinophils Absolute 0.1 0 - 0.7 K/uL   Basophils Relative 2 %   Basophils Absolute 0.1 0 - 0.1 K/uL  Protime-INR  Result Value Ref Range   Prothrombin Time 13.8 11.4 - 15.2 seconds   INR 1.07   Lipase, blood  Result Value Ref Range   Lipase 39 11 - 51 U/L   Ct Head Wo Contrast  Result Date: 02/12/2018 CLINICAL DATA:  57 year old male with head trauma. EXAM: CT HEAD WITHOUT CONTRAST CT CERVICAL SPINE WITHOUT CONTRAST TECHNIQUE: Multidetector CT imaging of the head and cervical spine was performed following the standard protocol without intravenous contrast. Multiplanar CT image reconstructions of the cervical spine were also generated. COMPARISON:  Head CT dated 09/13/2017 FINDINGS: CT HEAD FINDINGS Brain: There is mild age-related atrophy and chronic microvascular ischemic changes. There is no acute intracranial hemorrhage. No mass effect or midline shift. No extra-axial fluid collection. Vascular: No  hyperdense vessel or unexpected calcification. Skull: Normal. Negative for fracture or focal lesion. Sinuses/Orbits: Mild mucoperiosteal thickening of paranasal sinuses. No air-fluid levels. The mastoid air cells are clear. Other: None CT CERVICAL SPINE FINDINGS Alignment: Normal. Skull base and vertebrae: No acute fracture. No primary bone lesion or focal pathologic process. Soft tissues and spinal canal: No prevertebral fluid or swelling. No visible canal hematoma. Disc levels:  Mild degenerative changes at C6-C7. Upper chest: Negative. Other: None IMPRESSION: 1. No acute intracranial pathology. 2. No acute/traumatic cervical spine pathology. Electronically Signed   By: Anner Crete M.D.   On: 02/12/2018 21:55   Ct Cervical Spine Wo Contrast  Result Date: 02/12/2018 CLINICAL DATA:  57 year old male with head trauma. EXAM: CT HEAD WITHOUT CONTRAST CT CERVICAL SPINE WITHOUT CONTRAST TECHNIQUE: Multidetector CT imaging of the head and cervical spine was performed following the standard protocol without intravenous contrast. Multiplanar CT image reconstructions of the cervical spine were also generated. COMPARISON:  Head CT dated 09/13/2017 FINDINGS: CT HEAD FINDINGS Brain: There is mild age-related atrophy and chronic microvascular ischemic changes. There is no acute intracranial hemorrhage. No mass effect or midline shift. No extra-axial fluid collection. Vascular: No hyperdense vessel or unexpected calcification. Skull: Normal. Negative for fracture or focal lesion. Sinuses/Orbits: Mild mucoperiosteal thickening of paranasal sinuses. No air-fluid levels. The mastoid air cells are clear. Other: None CT CERVICAL SPINE FINDINGS Alignment: Normal. Skull base and vertebrae: No acute fracture. No primary bone lesion or focal pathologic process. Soft tissues and spinal canal: No prevertebral fluid or swelling. No visible canal hematoma. Disc levels:  Mild degenerative changes at C6-C7. Upper chest: Negative.  Other: None IMPRESSION: 1. No acute intracranial pathology. 2. No acute/traumatic cervical spine pathology. Electronically Signed   By: Anner Crete M.D.   On: 02/12/2018 21:55   Ct Abdomen Pelvis W Contrast  Result Date: 03/02/2018 CLINICAL DATA:  Acute onset of lower back pain and increased urinary urgency. EXAM: CT ABDOMEN AND PELVIS WITH CONTRAST TECHNIQUE: Multidetector CT imaging of the abdomen and pelvis was performed using the standard protocol following bolus administration of intravenous contrast. CONTRAST:  122mL ISOVUE-300 IOPAMIDOL (ISOVUE-300) INJECTION 61% COMPARISON:  CT of the abdomen and pelvis performed 11/13/2017 FINDINGS: Lower chest: The visualized lung bases are grossly clear. The visualized portions of the mediastinum are unremarkable. Hepatobiliary: The liver is unremarkable in appearance. The patient is status  post cholecystectomy. The common bile duct remains normal in caliber. Pancreas: Several duodenal diverticula are noted at the pancreatic head, containing air and fluid. The pancreas is otherwise unremarkable. Spleen: The spleen is unremarkable in appearance. Adrenals/Urinary Tract: The adrenal glands are unremarkable in appearance. Nonobstructing bilateral renal stones are seen, measuring up to 1.3 cm on the left. Mild nonspecific perinephric stranding is noted bilaterally. There is no evidence of hydronephrosis. No obstructing ureteral stones are seen. Stomach/Bowel: The stomach is unremarkable in appearance. The small bowel is within normal limits. The appendix is normal in caliber, without evidence of appendicitis. Diffuse fatty infiltration of the wall of the ascending colon raises concern for sequelae of chronic inflammation. Scattered diverticulosis is noted along the proximal sigmoid colon, without evidence of diverticulitis. Vascular/Lymphatic: The abdominal aorta is unremarkable in appearance. The inferior vena cava is grossly unremarkable. No retroperitoneal  lymphadenopathy is seen. No pelvic sidewall lymphadenopathy is identified. Reproductive: The bladder is mildly distended and grossly unremarkable. The prostate is borderline prominent, measuring 4.8 cm in transverse dimension. Other: No additional soft tissue abnormalities are seen. Musculoskeletal: No acute osseous abnormalities are identified. The visualized musculature is unremarkable in appearance. IMPRESSION: 1. No acute abnormality seen to explain the patient's symptoms. 2. Diffuse fatty infiltration of the wall of the ascending colon raises concern for sequelae of chronic inflammation. Scattered diverticulosis along the proximal sigmoid colon, without evidence of diverticulitis. 3. Nonobstructing bilateral renal stones, measuring up to 1.3 cm on the left. 4. 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 Pancreas unremarkable in appearance. Electronically Signed   By: Garald Balding M.D.   On: 03/02/2018 06:21

## 2018-03-02 NOTE — ED Triage Notes (Signed)
Patient ambulatory to triage with steady gait, without difficulty or distress noted; pt reports lower back pain tonight nonradiating with urinary urgency

## 2018-03-03 LAB — URINE CULTURE: Culture: NO GROWTH

## 2018-03-08 ENCOUNTER — Encounter: Payer: Self-pay | Admitting: Emergency Medicine

## 2018-03-08 ENCOUNTER — Other Ambulatory Visit: Payer: Self-pay

## 2018-03-08 ENCOUNTER — Emergency Department: Payer: Self-pay

## 2018-03-08 ENCOUNTER — Emergency Department
Admission: EM | Admit: 2018-03-08 | Discharge: 2018-03-08 | Disposition: A | Discharge status: Home / Self Care | Payer: Self-pay | Attending: Emergency Medicine | Admitting: Emergency Medicine

## 2018-03-08 DIAGNOSIS — R0602 Shortness of breath: Secondary | ICD-10-CM | POA: Insufficient documentation

## 2018-03-08 DIAGNOSIS — J45909 Unspecified asthma, uncomplicated: Secondary | ICD-10-CM | POA: Insufficient documentation

## 2018-03-08 DIAGNOSIS — Z87891 Personal history of nicotine dependence: Secondary | ICD-10-CM | POA: Insufficient documentation

## 2018-03-08 DIAGNOSIS — I10 Essential (primary) hypertension: Secondary | ICD-10-CM | POA: Insufficient documentation

## 2018-03-08 DIAGNOSIS — F419 Anxiety disorder, unspecified: Secondary | ICD-10-CM | POA: Insufficient documentation

## 2018-03-08 LAB — BASIC METABOLIC PANEL
Anion gap: 12 (ref 5–15)
BUN: 7 mg/dL (ref 6–20)
CO2: 22 mmol/L (ref 22–32)
Calcium: 9.1 mg/dL (ref 8.9–10.3)
Chloride: 108 mmol/L (ref 101–111)
Creatinine, Ser: 0.99 mg/dL (ref 0.61–1.24)
GFR calc Af Amer: >60 mL/min (ref >60)
GFR calc non Af Amer: >60 mL/min (ref >60)
Glucose, Bld: 139 mg/dL — ABNORMAL HIGH (ref 65–99)
Potassium: 2.9 mmol/L — ABNORMAL LOW (ref 3.5–5.1)
Sodium: 142 mmol/L (ref 135–145)

## 2018-03-08 LAB — CBC
HCT: 36.1 % — ABNORMAL LOW (ref 40.0–52.0)
Hemoglobin: 11.3 g/dL — ABNORMAL LOW (ref 13.0–18.0)
MCH: 23 pg — ABNORMAL LOW (ref 26.0–34.0)
MCHC: 31.4 g/dL — ABNORMAL LOW (ref 32.0–36.0)
MCV: 73.3 fL — ABNORMAL LOW (ref 80.0–100.0)
Platelets: 220 10*3/uL (ref 150–440)
RBC: 4.92 MIL/uL (ref 4.40–5.90)
RDW: 21 % — ABNORMAL HIGH (ref 11.5–14.5)
WBC: 5.9 10*3/uL (ref 3.8–10.6)

## 2018-03-08 LAB — TROPONIN I: Troponin I: <0.03 ng/mL (ref <0.03)

## 2018-03-08 MED ORDER — LORAZEPAM 1 MG PO TABS: 1.0000 mg | ORAL_TABLET | Freq: Three times a day (TID) | ORAL | 0 refills | Status: DC | PRN

## 2018-03-08 MED ORDER — POTASSIUM CHLORIDE CRYS ER 20 MEQ PO TBCR
40.0000 meq | EXTENDED_RELEASE_TABLET | Freq: Once | ORAL | Status: AC
  Administered 2018-03-08: 40 meq via ORAL
  Filled 2018-03-08: qty 2

## 2018-03-08 MED ORDER — LORAZEPAM 1 MG PO TABS
1.0000 mg | ORAL_TABLET | Freq: Once | ORAL | Status: AC
  Administered 2018-03-08: 1 mg via ORAL
  Filled 2018-03-08: qty 1

## 2018-03-08 NOTE — ED Notes (Signed)
Patient transported to X-ray 

## 2018-03-08 NOTE — ED Notes (Signed)
Pt ambulatory to wheel chair upon discharge. Verbalized understanding of discharge instructions, follow-up care and outpatient resources delivered by TTS. This RN wheeled pt to ATM on [address] so he could get cash out for cab and brought him to lobby. VSS. Skin warm and dry. A&O.

## 2018-03-08 NOTE — BH Assessment (Signed)
Per request of ER MD Mariea Clonts), writer provided the pt. with information and instructions on how to access Outpatient Mental Health services.  Patient denies SI/HI and AV/H.    [facility] [address]  [place], [place] [address] [phone]  [facility] [address] [place],  [place], [place] [address] [phone]  [facility] [address]  [place], [place] [address] [phone]  [facility] [address],  [place], [place] [address]  [phone]  [facility] [address] [place],  [address]  [phone]  Prudencio Pair, [facility] [address]  [address] [place], [place] [phone]    Fausto Skillern Licensed Professional Counselor, [facility], [facility]  [facility] [address]  [place], [place] [phone]   Dr. Harless Litten Pastoral Counselor/Therapist, [place], [place], [place], [facility] [address]  [place], [place] [phone]

## 2018-03-08 NOTE — ED Provider Notes (Signed)
[facility] Emergency Department Provider Note  ____________________________________________  Time seen: Approximately 9:46 PM  I have reviewed the triage vital signs and the nursing notes.   HISTORY  Chief Complaint Shortness of Breath    HPI Miguel [name] is a 57 y.o. male with a history of HTN, OCD, and anxiety, asthma, presenting for shortness of breath and anxiety.  The patient reports that today he developed shortness of breath and wheezing.  He uses Spiriva every morning but does not like to use his albuterol because it makes him feel shaky.  He reports a significant amount of anxiety which has been getting progressively worse over the last several months.  He states [facility] physician will not give him psychiatric medications but he has not seen a psychiatrist.  He has had a mild cough without any congestion or rhinorrhea, sore throat or ear pain.  Per EMS report, the patient was having forcible expiration upon arrival.  He had normal saturations.  Past Medical History:  Diagnosis Date  . Alcohol abuse   . Asthma   . GERD (gastroesophageal reflux disease)   . Gout   . Hypertension   . OCD (obsessive compulsive disorder)   . Renal colic     Patient Active Problem List   Diagnosis Date Noted  . Leg hematoma 12/25/2017  . Suicide and self-inflicted injury by cutting and piercing instrument ([place]) 03/10/2017  . Severe recurrent major 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 ([place]) 03/09/2017  . Substance induced mood disorder ([place]) 08/15/2016  . Involuntary commitment 08/15/2016  . Alcohol use disorder, severe, dependence ([place]) 02/05/2016  . Hypertension 12/05/2015  . Tachycardia 12/05/2015  . Gout 11/13/2015  . Chronic back pain 05/02/2015    Past Surgical History:  Procedure Laterality Date  . CHOLECYSTECTOMY  2012    Current Outpatient Rx  . Order #: [id number] Class: Normal  . Order #: [id number] Class: Historical Med  . Order #:  [id number] Class: Print  . Order #: [id number] Class: Historical Med  . Order #: [id number] Class: Print  . Order #: [id number] Class: Print  . Order #: [id number] Class: Print  . Order #: [id number] Class: Print  . Order #: [id number] Class: Print  . Order #: [id number] Class: Print  . Order #: [id number] Class: Print  . Order #: [id number] Class: Print  . Order #: [id number] Class: Normal    Allergies Patient has no known allergies.  Family History  Problem Relation Age of Onset  . Alcohol abuse Father     Social History Social History   Tobacco Use  . Smoking status: Former Research scientist (life sciences)  . Smokeless tobacco: Never Used  . Tobacco comment: quit 30 years ago  Substance Use Topics  . Alcohol use: Yes    Alcohol/week: 4.2 oz    Types: 7 Glasses of wine per week    Comment: Pt states, "I quit all the hard stuff 4 months ago"  . Drug use: No    Review of Systems Constitutional: No fever/chills.  No lightheadedness or syncope. Eyes: No visual changes. ENT: No sore throat. No congestion or rhinorrhea.  No ear pain. Cardiovascular: Denies chest pain. Denies palpitations. Respiratory: Positive wheezing and shortness of breath.  Positive nonproductive cough. Gastrointestinal: No abdominal pain.  No nausea, no vomiting.  No diarrhea.  No constipation. Genitourinary: Negative for dysuria. Musculoskeletal: Negative for back pain. Skin: Negative for rash. Neurological: Negative for headaches. No focal numbness, tingling or weakness.     ____________________________________________   PHYSICAL EXAM:  VITAL SIGNS: ED Triage Vitals  Enc Vitals  Group     BP 03/08/18 2015 132/77     Pulse Rate 03/08/18 2015 (!) 107     Resp 03/08/18 2015 18     Temp 03/08/18 2015 (!) 97.5 F (36.4 C)     Temp Source 03/08/18 2015 Oral     SpO2 03/08/18 2015 100 %     Weight 03/08/18 2017 225 lb (102.1 kg)     Height 03/08/18 2017 6' (1.829 m)     Head Circumference --      Peak Flow --      Pain Score 03/08/18  2017 0     Pain Loc --      Pain Edu? --      Excl. in [place]? --     Constitutional: Patient is alert, oriented and answering questions appropriately.  He is significantly anxious appearing.  He has rhythmic movements of the mouth that are typical of his OCD. Eyes: Conjunctivae are normal.  EOMI. No scleral icterus. Head: Atraumatic. Nose: No congestion/rhinnorhea. Mouth/Throat: Mucous membranes are moist.  Neck: No stridor.  Supple.  No JVD.  No meningismus. Cardiovascular: Normal rate, regular rhythm. No murmurs, rubs or gallops.  Respiratory: During my history taking, the patient has normal respiratory effort.  No accessory muscle use or retractions. Lungs CTAB.  No wheezes, rales or ronchi.  However, upon examination, the patient begins to have forcible expiration with forced upper airway wheezing.  The lung fields remain normal during this change.  He continues to maintain oxygen saturations of greater than 95% on room air during the entirety of my stay in the room. Gastrointestinal: Obese.  Soft, nontender and nondistended.  No guarding or rebound.  No peritoneal signs. Musculoskeletal: No LE edema. No ttp in the calves or palpable cords.  Negative Homan's sign. Neurologic:  A&Ox3.  Speech is clear.  Face and smile are symmetric.  EOMI.  Moves all extremities well. Skin:  Skin is warm, dry and intact. No rash noted. Psychiatric: The patient has a anxious mood and bizarre affect.  He does have some rhythmic movements of the mouth and throat clearing that are suggestive of OCD. ____________________________________________   LABS (all labs ordered are listed, but only abnormal results are displayed)  Labs Reviewed  CBC - Abnormal; Notable for the following components:      Result Value   Hemoglobin 11.3 (*)    HCT 36.1 (*)    MCV 73.3 (*)    MCH 23.0 (*)    MCHC 31.4 (*)    RDW 21.0 (*)    All other components within normal limits  BASIC METABOLIC PANEL - Abnormal; Notable for the  following components:   Potassium 2.9 (*)    Glucose, Bld 139 (*)    All other components within normal limits  TROPONIN I   ____________________________________________  EKG  ED ECG REPORT I, Eula Listen, the attending physician, personally viewed and interpreted this ECG.   Date: 03/08/2018  EKG Time: 2016  Rate: 107  Rhythm: sinus tachycardia  Axis: leftward  Intervals:none  ST&T Change: No STEMI  ____________________________________________  RADIOLOGY  Dg Chest 2 View  Result Date: 03/08/2018 CLINICAL DATA:  Shortness of breath EXAM: CHEST - 2 VIEW COMPARISON:  10/03/2017 FINDINGS: Atelectasis at the lingula. No acute consolidation or effusion. Normal heart size. No pneumothorax. Mild degenerative changes of the spine. IMPRESSION: No active cardiopulmonary disease.  Mild atelectasis at the lingula. Electronically Signed   By: Donavan Foil M.D.   On: 03/08/2018  20:48    ____________________________________________   PROCEDURES  Procedure(s) performed: None  Procedures  Critical Care performed: No ____________________________________________   INITIAL IMPRESSION / ASSESSMENT AND PLAN / ED COURSE  Pertinent labs & imaging results that were available during my care of the patient were reviewed by me and considered in my medical decision making (see chart for details).  57 y.o. male with a history of asthma and OCD, anxiety, untreated at this time, presenting with shortness of breath and wheezing.  Overall, the patient has clear lung fields on examination and normal oxygen saturations.  His wheezing is forcible.  It is likely that his symptoms are most consistent with his OCD you are on treated anxiety, and I have encouraged him to get psychiatry follow-up [facility].  We will also provide him with outpatient psychiatry resources.  His pulmonary examination is reassuring.  His chest x-ray shows some mild atelectasis but no evidence of  pneumonia.  His cardiac workup is reassuring without any evidence of arrhythmia or ischemia, and a negative troponin.  I have had a long discussion with the patient about his findings, and given him some recommendations for alleviating his anxiety with positive thinking and deep breaths.  At this time, the patient is stable for discharge.  He understands follow-up instructions as well as return precautions. ____________________________________________  FINAL CLINICAL IMPRESSION(S) / ED DIAGNOSES  Final diagnoses:  SOB (shortness of breath)  Anxiety         NEW MEDICATIONS STARTED DURING THIS VISIT:  New Prescriptions   LORAZEPAM (ATIVAN) 1 MG TABLET    Take 1 tablet (1 mg total) by mouth every 8 (eight) hours as needed for anxiety.      Eula Listen, MD 03/08/18 2156

## 2018-03-08 NOTE — ED Triage Notes (Addendum)
Pt arrives via [facility] from home with complaints of shortness of breath that began "a few hours ago." Pt states he is "stressed about a lot" in his life. Pt coughing in triage but appears in NAD at this time. VSS for EMS.  Pt appears to have a dry cough at this time. Non-productive. Pt is holding his breath and then gasping for air. When talking with patient, breaths are normal and unlabored.   Pt reports taking advair diskus PTA d/t "not liking how the albuterol makes [him] feel."

## 2018-03-08 NOTE — Discharge Instructions (Signed)
Please make an appoint with a psychiatrist to initiate management of your anxiety and OCD.  Continue to take all of your asthma medications as prescribed.  Return to the emergency department if you develop severe pain, lightheadedness or fainting, fever, thoughts of hurting herself or anyone else, hallucinations, or any other symptoms concerning to you.

## 2018-03-09 ENCOUNTER — Other Ambulatory Visit: Payer: Self-pay

## 2018-03-09 DIAGNOSIS — I10 Essential (primary) hypertension: Secondary | ICD-10-CM | POA: Insufficient documentation

## 2018-03-09 DIAGNOSIS — J45909 Unspecified asthma, uncomplicated: Secondary | ICD-10-CM | POA: Insufficient documentation

## 2018-03-09 DIAGNOSIS — Z9119 Patient's noncompliance with other medical treatment and regimen: Secondary | ICD-10-CM | POA: Insufficient documentation

## 2018-03-09 DIAGNOSIS — Z87891 Personal history of nicotine dependence: Secondary | ICD-10-CM | POA: Insufficient documentation

## 2018-03-09 DIAGNOSIS — Z79899 Other long term (current) drug therapy: Secondary | ICD-10-CM | POA: Insufficient documentation

## 2018-03-09 DIAGNOSIS — F419 Anxiety disorder, unspecified: Secondary | ICD-10-CM | POA: Insufficient documentation

## 2018-03-09 DIAGNOSIS — R1030 Lower abdominal pain, unspecified: Secondary | ICD-10-CM | POA: Insufficient documentation

## 2018-03-09 DIAGNOSIS — R0600 Dyspnea, unspecified: Secondary | ICD-10-CM | POA: Insufficient documentation

## 2018-03-09 NOTE — ED Notes (Signed)
Pt brought in by [facility] was here yesterday and dx with anxiety. Took rx med earlier today but none since.

## 2018-03-09 NOTE — ED Notes (Signed)
No protocols to be repeated per Dr. Mariea Clonts.

## 2018-03-09 NOTE — ED Triage Notes (Signed)
Pt brought in by ems for lower abd pain and shob, pt was here for the same yesterday and dx with anxiety.

## 2018-03-10 ENCOUNTER — Encounter: Payer: Self-pay | Admitting: Emergency Medicine

## 2018-03-10 ENCOUNTER — Emergency Department
Admission: EM | Admit: 2018-03-10 | Discharge: 2018-03-10 | Disposition: A | Discharge status: Home / Self Care | Payer: No Typology Code available for payment source | Attending: Emergency Medicine | Admitting: Emergency Medicine

## 2018-03-10 ENCOUNTER — Emergency Department
Admission: EM | Admit: 2018-03-10 | Discharge: 2018-03-10 | Disposition: A | Discharge status: Home / Self Care | Payer: Medicaid Other | Attending: Emergency Medicine | Admitting: Emergency Medicine

## 2018-03-10 ENCOUNTER — Other Ambulatory Visit: Payer: Self-pay

## 2018-03-10 DIAGNOSIS — W260XXA Contact with knife, initial encounter: Secondary | ICD-10-CM | POA: Insufficient documentation

## 2018-03-10 DIAGNOSIS — X789XXA Intentional self-harm by unspecified sharp object, initial encounter: Secondary | ICD-10-CM

## 2018-03-10 DIAGNOSIS — Y929 Unspecified place or not applicable: Secondary | ICD-10-CM | POA: Insufficient documentation

## 2018-03-10 DIAGNOSIS — Z87891 Personal history of nicotine dependence: Secondary | ICD-10-CM | POA: Insufficient documentation

## 2018-03-10 DIAGNOSIS — F329 Major depressive disorder, single episode, unspecified: Secondary | ICD-10-CM | POA: Insufficient documentation

## 2018-03-10 DIAGNOSIS — Z5321 Procedure and treatment not carried out due to patient leaving prior to being seen by health care provider: Secondary | ICD-10-CM | POA: Insufficient documentation

## 2018-03-10 DIAGNOSIS — T17208A Unspecified foreign body in pharynx causing other injury, initial encounter: Secondary | ICD-10-CM | POA: Insufficient documentation

## 2018-03-10 DIAGNOSIS — Y939 Activity, unspecified: Secondary | ICD-10-CM | POA: Insufficient documentation

## 2018-03-10 DIAGNOSIS — F1994 Other psychoactive substance use, unspecified with psychoactive substance-induced mood disorder: Secondary | ICD-10-CM | POA: Diagnosis present

## 2018-03-10 DIAGNOSIS — Y9375 Activity, martial arts: Secondary | ICD-10-CM | POA: Insufficient documentation

## 2018-03-10 DIAGNOSIS — F10129 Alcohol abuse with intoxication, unspecified: Secondary | ICD-10-CM | POA: Insufficient documentation

## 2018-03-10 DIAGNOSIS — F10220 Alcohol dependence with intoxication, uncomplicated: Secondary | ICD-10-CM | POA: Diagnosis present

## 2018-03-10 DIAGNOSIS — Y908 Blood alcohol level of 240 mg/100 ml or more: Secondary | ICD-10-CM | POA: Insufficient documentation

## 2018-03-10 DIAGNOSIS — Z79899 Other long term (current) drug therapy: Secondary | ICD-10-CM | POA: Insufficient documentation

## 2018-03-10 DIAGNOSIS — Z046 Encounter for general psychiatric examination, requested by authority: Secondary | ICD-10-CM | POA: Insufficient documentation

## 2018-03-10 DIAGNOSIS — J45909 Unspecified asthma, uncomplicated: Secondary | ICD-10-CM | POA: Insufficient documentation

## 2018-03-10 DIAGNOSIS — F10929 Alcohol use, unspecified with intoxication, unspecified: Secondary | ICD-10-CM

## 2018-03-10 DIAGNOSIS — Y999 Unspecified external cause status: Secondary | ICD-10-CM | POA: Insufficient documentation

## 2018-03-10 DIAGNOSIS — S61411A Laceration without foreign body of right hand, initial encounter: Secondary | ICD-10-CM | POA: Insufficient documentation

## 2018-03-10 DIAGNOSIS — F1020 Alcohol dependence, uncomplicated: Secondary | ICD-10-CM | POA: Diagnosis present

## 2018-03-10 DIAGNOSIS — S6991XA Unspecified injury of right wrist, hand and finger(s), initial encounter: Secondary | ICD-10-CM

## 2018-03-10 DIAGNOSIS — I10 Essential (primary) hypertension: Secondary | ICD-10-CM | POA: Diagnosis present

## 2018-03-10 DIAGNOSIS — X58XXXA Exposure to other specified factors, initial encounter: Secondary | ICD-10-CM | POA: Insufficient documentation

## 2018-03-10 DIAGNOSIS — S61519A Laceration without foreign body of unspecified wrist, initial encounter: Secondary | ICD-10-CM

## 2018-03-10 DIAGNOSIS — S61512A Laceration without foreign body of left wrist, initial encounter: Secondary | ICD-10-CM

## 2018-03-10 DIAGNOSIS — R0600 Dyspnea, unspecified: Secondary | ICD-10-CM

## 2018-03-10 LAB — ACETAMINOPHEN LEVEL: Acetaminophen (Tylenol), Serum: <10 ug/mL — ABNORMAL LOW (ref 10–30)

## 2018-03-10 LAB — URINE DRUG SCREEN, QUALITATIVE (ARMC ONLY)
Amphetamines, Ur Screen: NOT DETECTED
Barbiturates, Ur Screen: NOT DETECTED
Benzodiazepine, Ur Scrn: POSITIVE — AB
Cannabinoid 50 Ng, Ur ~~LOC~~: POSITIVE — AB
Cocaine Metabolite,Ur ~~LOC~~: NOT DETECTED
MDMA (Ecstasy)Ur Screen: NOT DETECTED
Methadone Scn, Ur: NOT DETECTED
Opiate, Ur Screen: NOT DETECTED
Phencyclidine (PCP) Ur S: NOT DETECTED
Tricyclic, Ur Screen: NOT DETECTED

## 2018-03-10 LAB — CBC
HCT: 35 % — ABNORMAL LOW (ref 40.0–52.0)
Hemoglobin: 11.3 g/dL — ABNORMAL LOW (ref 13.0–18.0)
MCH: 23.4 pg — ABNORMAL LOW (ref 26.0–34.0)
MCHC: 32.1 g/dL (ref 32.0–36.0)
MCV: 72.9 fL — ABNORMAL LOW (ref 80.0–100.0)
Platelets: 233 10*3/uL (ref 150–440)
RBC: 4.8 MIL/uL (ref 4.40–5.90)
RDW: 21 % — ABNORMAL HIGH (ref 11.5–14.5)
WBC: 5.5 10*3/uL (ref 3.8–10.6)

## 2018-03-10 LAB — ETHANOL: Alcohol, Ethyl (B): 246 mg/dL — ABNORMAL HIGH (ref <10)

## 2018-03-10 LAB — COMPREHENSIVE METABOLIC PANEL
ALT: 12 U/L — ABNORMAL LOW (ref 17–63)
AST: 14 U/L — ABNORMAL LOW (ref 15–41)
Albumin: 4.3 g/dL (ref 3.5–5.0)
Alkaline Phosphatase: 71 U/L (ref 38–126)
Anion gap: 9 (ref 5–15)
BUN: 7 mg/dL (ref 6–20)
CO2: 21 mmol/L — ABNORMAL LOW (ref 22–32)
Calcium: 8.9 mg/dL (ref 8.9–10.3)
Chloride: 111 mmol/L (ref 101–111)
Creatinine, Ser: 0.97 mg/dL (ref 0.61–1.24)
GFR calc Af Amer: >60 mL/min (ref >60)
GFR calc non Af Amer: >60 mL/min (ref >60)
Glucose, Bld: 114 mg/dL — ABNORMAL HIGH (ref 65–99)
Potassium: 3.3 mmol/L — ABNORMAL LOW (ref 3.5–5.1)
Sodium: 141 mmol/L (ref 135–145)
Total Bilirubin: 0.6 mg/dL (ref 0.3–1.2)
Total Protein: 7.5 g/dL (ref 6.5–8.1)

## 2018-03-10 LAB — SALICYLATE LEVEL: Salicylate Lvl: <7 mg/dL (ref 2.8–30.0)

## 2018-03-10 MED ORDER — DIPHENHYDRAMINE HCL 25 MG PO CAPS
50.0000 mg | ORAL_CAPSULE | Freq: Once | ORAL | Status: AC
  Administered 2018-03-10: 50 mg via ORAL
  Filled 2018-03-10: qty 2

## 2018-03-10 MED ORDER — HALOPERIDOL 5 MG PO TABS
5.0000 mg | ORAL_TABLET | Freq: Once | ORAL | Status: AC
  Administered 2018-03-10: 5 mg via ORAL
  Filled 2018-03-10: qty 1

## 2018-03-10 NOTE — ED Notes (Signed)
Pt signed hardcopy of E-signature. Placed in chart 

## 2018-03-10 NOTE — ED Notes (Signed)
Pt lying on bench in lobby with eyes closed

## 2018-03-10 NOTE — ED Triage Notes (Signed)
Pt to triage via w/c in custody of [place] [employer] for IVC; pt with superficial laceration to right palm with no active bleeding--gauze dressing applied; st "doesn't know why he cut himself"

## 2018-03-10 NOTE — Consult Note (Signed)
[facility] Consult   Reason for Consult: Consult for 57 year old man with a history of alcohol abuse who came to the emergency room having cut himself on the hand Referring Physician: Joni Fears Patient Identification: Miguel [name] MRN:  [id number] Principal Diagnosis: Substance induced mood disorder [facility]) Diagnosis:   Patient Active Problem List   Diagnosis Date Noted  . Self-inflicted laceration of wrist [S61.519A] 03/10/2018  . Leg hematoma [S80.10XA] 12/25/2017  . Suicide and self-inflicted injury by cutting and piercing instrument ([place]) [X78.9XXA] 03/10/2017  . Severe recurrent major 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 ([place]) [F33.2] 03/09/2017  . Substance induced mood disorder ([place]) [F19.94] 08/15/2016  . Involuntary commitment [Z04.6] 08/15/2016  . Alcohol use disorder, severe, dependence ([place]) [F10.20] 02/05/2016  . Hypertension [I10] 12/05/2015  . Tachycardia [R00.0] 12/05/2015  . Gout [M10.9] 11/13/2015  . Chronic back pain [M54.9, G89.29] 05/02/2015    Total Time spent with patient: 1 hour  Subjective:   Miguel [name] is a 57 y.o. male patient admitted with "I was playing around with martial arts weapons".  HPI: Patient interviewed chart reviewed.  57 year old man with a history of alcohol abuse.  He was in the emergency room last night for some physical complaints and was sent home around 1:00 in the morning and came back here around 6:00 in the morning having cut himself on the hand.  Patient was intoxicated with a blood alcohol level over 200.  He has been cooperative since coming into the emergency room.  Patient denies there was any suicidal intention or plan.  He says that he went home last night and was fooling around with martial arts weapons and knives that he has at home.  Patient admits however that he does feel like he is under a lot of stress.  He talks about the anxiety and stress of taking care of his wife who is bedbound.  He has not been able to  work in quite a while because of this.  He admits that he is continuing to drink but tries to downplay it saying he only drinks wine and only "a little".  Denies that he is using other drugs.  Not currently getting any psychiatric treatment.  Social history: Patient is married.  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  Medical history: History of previous lacerations to his arm as well.  Substance abuse history: Well documented history of extensive alcohol abuse.  No history of seizures or delirium tremens.  Patient has been in substance abuse treatment previously but dropped out because he did not relate to it.  Past Psychiatric History: Patient has been seen before under similar circumstances and has been admitted to the psychiatric unit at least once previously about a year ago.  At that time he was given a diagnosis also of major depression and was started on Prozac.  When I interviewed him today the patient had no memory of ever having taken an antidepressant.  Does not seem like he ever thought it worked and probably did not follow up with it.  Does not appear that he is ever made a very serious suicide attempt but rather cuts and stabbed himself when he is drunk.  Risk to Self: Suicidal Ideation: No Suicidal Intent: No Is patient at risk for suicide?: No Suicidal Plan?: No Access to Means: Yes Specify Access to Suicidal Means: Knives What has been your use of drugs/alcohol within the last 12 months?: Pt  used etoh daily How many times?: 1 Other Self Harm Risks: Pt is an active cutter Triggers for Past Attempts: Unknown Intentional Self Injurious Behavior: Cutting, Damaging Comment - Self Injurious Behavior: pt cuts hands, arms, and legs Risk to Others: Homicidal Ideation: No Thoughts of Harm to Others: No Current Homicidal Intent: No Current Homicidal Plan: No Access to Homicidal Means: No Identified  Victim: n/a History of harm to others?: No Assessment of Violence: None Noted Violent Behavior Description: pt denies Does patient have access to weapons?: Yes (Comment) Criminal Charges Pending?: No Does patient have a court date: No Prior Inpatient Therapy: Prior Inpatient Therapy: No Prior Outpatient Therapy: Prior Outpatient Therapy: No Does patient have an ACCT team?: No Does patient have Intensive In-House Services?  : No Does patient have [facility] services? : No Does patient have P4CC services?: No  Past Medical History:  Past Medical History:  Diagnosis Date  . Alcohol abuse   . Asthma   . GERD (gastroesophageal reflux disease)   . Gout   . Hypertension   . OCD (obsessive compulsive disorder)   . Renal colic     Past Surgical History:  Procedure Laterality Date  . CHOLECYSTECTOMY  2012   Family History:  Family History  Problem Relation Age of Onset  . Alcohol abuse Father    Family Psychiatric  History: Alcohol Social History:  Social History   Substance and Sexual Activity  Alcohol Use Yes  . Alcohol/week: 4.2 oz  . Types: 7 Glasses of wine per week   Comment: Pt states, "I quit all the hard stuff 4 months ago"     Social History   Substance and Sexual Activity  Drug Use No    Social History   Socioeconomic History  . Marital status: Married    Spouse name: Not on file  . Number of children: Not on file  . Years of education: Not on file  . Highest education level: Not on file  Occupational History  . Not on file  Social Needs  . Financial resource strain: Not on file  . Food insecurity:    Worry: Not on file    Inability: Not on file  . Transportation needs:    Medical: Not on file    Non-medical: Not on file  Tobacco Use  . Smoking status: Former Research scientist (life sciences)  . Smokeless tobacco: Never Used  . Tobacco comment: quit 30 years ago  Substance and Sexual Activity  . Alcohol use: Yes    Alcohol/week: 4.2 oz    Types: 7 Glasses of wine per week     Comment: Pt states, "I quit all the hard stuff 4 months ago"  . Drug use: No  . Sexual activity: Yes  Lifestyle  . Physical activity:    Days per week: Not on file    Minutes per session: Not on file  . Stress: Not on file  Relationships  . Social connections:    Talks on phone: Not on file    Gets together: Not on file    Attends religious service: Not on file    Active member of club or organization: Not on file    Attends meetings of clubs or organizations: Not on file    Relationship status: Not on file  Other Topics Concern  . Not on file  Social History Narrative   Lives at home with his wife and takes care of his wife. Independent at baseline   Additional Social History:  Allergies:  No Known Allergies  Labs:  Results for orders placed or performed during the hospital encounter of 03/10/18 (from the past 48 hour(s))  Comprehensive metabolic panel     Status: Abnormal   Collection Time: 03/10/18  6:25 AM  Result Value Ref Range   Sodium 141 135 - 145 mmol/L   Potassium 3.3 (L) 3.5 - 5.1 mmol/L   Chloride 111 101 - 111 mmol/L   CO2 21 (L) 22 - 32 mmol/L   Glucose, Bld 114 (H) 65 - 99 mg/dL   BUN 7 6 - 20 mg/dL   Creatinine, Ser 0.97 0.61 - 1.24 mg/dL   Calcium 8.9 8.9 - 10.3 mg/dL   Total Protein 7.5 6.5 - 8.1 g/dL   Albumin 4.3 3.5 - 5.0 g/dL   AST 14 (L) 15 - 41 U/L   ALT 12 (L) 17 - 63 U/L   Alkaline Phosphatase 71 38 - 126 U/L   Total Bilirubin 0.6 0.3 - 1.2 mg/dL   GFR calc non Af Amer >60 >60 mL/min   GFR calc Af Amer >60 >60 mL/min    Comment: (NOTE) The eGFR has been calculated using the CKD EPI equation. This calculation has not been validated in all clinical situations. eGFR's persistently <60 mL/min signify possible Chronic Kidney Disease.    Anion gap 9 5 - 15    Comment: Performed at [facility] [address] [place], [place] [address]  Ethanol     Status: Abnormal   Collection Time: 03/10/18  6:25 AM  Result Value Ref  Range   Alcohol, Ethyl (B) 246 (H) <10 mg/dL    Comment:        LOWEST DETECTABLE LIMIT FOR SERUM ALCOHOL IS 10 mg/dL FOR MEDICAL PURPOSES ONLY Performed at [facility], [address], [place], [place] [address]   Salicylate level     Status: None   Collection Time: 03/10/18  6:25 AM  Result Value Ref Range   Salicylate Lvl <9.4 2.8 - 30.0 mg/dL    Comment: Performed at [facility], [address] [place], [place] [address]  Acetaminophen level     Status: Abnormal   Collection Time: 03/10/18  6:25 AM  Result Value Ref Range   Acetaminophen (Tylenol), Serum <10 (L) 10 - 30 ug/mL    Comment:        THERAPEUTIC CONCENTRATIONS VARY SIGNIFICANTLY. A RANGE OF 10-30 ug/mL MAY BE AN EFFECTIVE CONCENTRATION FOR MANY PATIENTS. HOWEVER, SOME ARE BEST TREATED AT CONCENTRATIONS OUTSIDE THIS RANGE. ACETAMINOPHEN CONCENTRATIONS >150 ug/mL AT 4 HOURS AFTER INGESTION AND >50 ug/mL AT 12 HOURS AFTER INGESTION ARE OFTEN ASSOCIATED WITH TOXIC REACTIONS. Performed at [facility], [address] [place], [place] [address]   cbc     Status: Abnormal   Collection Time: 03/10/18  6:25 AM  Result Value Ref Range   WBC 5.5 3.8 - 10.6 K/uL   RBC 4.80 4.40 - 5.90 MIL/uL   Hemoglobin 11.3 (L) 13.0 - 18.0 g/dL   HCT 35.0 (L) 40.0 - 52.0 %   MCV 72.9 (L) 80.0 - 100.0 fL   MCH 23.4 (L) 26.0 - 34.0 pg   MCHC 32.1 32.0 - 36.0 g/dL   RDW 21.0 (H) 11.5 - 14.5 %   Platelets 233 150 - 440 K/uL    Comment: Performed at [facility], [address] [place], [place] [address]  Urine Drug Screen, Qualitative     Status: Abnormal   Collection Time: 03/10/18  11:11 AM  Result Value Ref Range   Tricyclic, Ur Screen NONE DETECTED NONE DETECTED   Amphetamines, Ur Screen NONE DETECTED NONE DETECTED   MDMA (Ecstasy)Ur Screen NONE DETECTED NONE DETECTED   Cocaine Metabolite,Ur [place] NONE DETECTED NONE DETECTED   Opiate, Ur Screen NONE DETECTED NONE DETECTED    Phencyclidine (PCP) Ur S NONE DETECTED NONE DETECTED   Cannabinoid 50 Ng, Ur  POSITIVE (A) NONE DETECTED   Barbiturates, Ur Screen NONE DETECTED NONE DETECTED   Benzodiazepine, Ur Scrn POSITIVE (A) NONE DETECTED   Methadone Scn, Ur NONE DETECTED NONE DETECTED    Comment: (NOTE) Tricyclics + metabolites, urine    Cutoff 1000 ng/mL Amphetamines + metabolites, urine  Cutoff 1000 ng/mL MDMA (Ecstasy), urine              Cutoff 500 ng/mL Cocaine Metabolite, urine          Cutoff 300 ng/mL Opiate + metabolites, urine        Cutoff 300 ng/mL Phencyclidine (PCP), urine         Cutoff 25 ng/mL Cannabinoid, urine                 Cutoff 50 ng/mL Barbiturates + metabolites, urine  Cutoff 200 ng/mL Benzodiazepine, urine              Cutoff 200 ng/mL Methadone, urine                   Cutoff 300 ng/mL The urine drug screen provides only a preliminary, unconfirmed analytical test result and should not be used for non-medical purposes. Clinical consideration and professional judgment should be applied to any positive drug screen result due to possible interfering substances. A more specific alternate chemical method must be used in order to obtain a confirmed analytical result. Gas chromatography / mass spectrometry (GC/MS) is the preferred confirmat ory method. Performed at [facility], [address] [place], [place] [address]     No current facility-administered medications for this encounter.    Current Outpatient Medications  Medication Sig Dispense Refill  . ADVAIR DISKUS 100-50 MCG/DOSE AEPB INHALE 1 PUFF 2 TIMES A DAY. RINSE MOUTH AND SPIT AFTER EACH USE. 180 each 0  . allopurinol (ZYLOPRIM) 300 MG tablet Take 300 mg by mouth daily.    . chlordiazePOXIDE (LIBRIUM) 25 MG capsule 50 mg 3 times a day x 3 days 50 mg 2 times a day for 3 days 50 mg once a day for 3 days stop 36 capsule 0  . dexlansoprazole (DEXILANT) 60 MG capsule Take 60 mg by mouth every morning.    .  ferrous sulfate 325 (65 FE) MG tablet Take 1 tablet (325 mg total) by mouth 2 (two) times daily with a meal. 60 tablet 1  . HYDROcodone-acetaminophen (NORCO) 5-325 MG tablet Take 1 tablet by mouth every 6 (six) hours as needed for up to 7 doses for severe pain. 7 tablet 0  . lidocaine (LIDODERM) 5 % Place 1 patch onto the skin every 12 (twelve) hours. Remove & Discard patch within 12 hours or as directed by MD 10 patch 0  . lisinopril (PRINIVIL,ZESTRIL) 20 MG tablet Take 1 tablet (20 mg total) by mouth every morning. 30 tablet 1  . LORazepam (ATIVAN) 1 MG tablet Take 1 tablet (1 mg total) by mouth every 8 (eight) hours as needed for anxiety. 6 tablet 0  . omeprazole (PRILOSEC) 40 MG capsule Take 1 capsule (40 mg total) by mouth daily.  30 capsule 0  . tamsulosin (FLOMAX) 0.4 MG CAPS capsule Take 1 capsule (0.4 mg total) by mouth daily. 30 capsule 0  . traZODone (DESYREL) 50 MG tablet Take 1 tablet (50 mg total) by mouth at bedtime as needed for sleep. 30 tablet 1  . VENTOLIN HFA 108 (90 Base) MCG/ACT inhaler INHALE 2 PUFFS EVERY 4 HOURS AS NEEDED 72 g 0    Musculoskeletal: Strength & Muscle Tone: within normal limits Gait & Station: normal Patient leans: N/A  Psychiatric Specialty Exam: Physical Exam  Nursing note and vitals reviewed. Constitutional: He appears well-developed and well-nourished.  HENT:  Head: Normocephalic and atraumatic.  Eyes: Pupils are equal, round, and reactive to light. Conjunctivae are normal.  Neck: Normal range of motion.  Cardiovascular: Regular rhythm and normal heart sounds.  Respiratory: Effort normal.  GI: Soft.  Musculoskeletal: Normal range of motion.  Neurological: He is alert.  Skin: Skin is warm and dry.     Psychiatric: His affect is blunt. His speech is delayed. He is slowed. Thought content is not paranoid. Cognition and memory are impaired. He expresses impulsivity. He expresses no homicidal and no suicidal ideation.    Review of Systems    Constitutional: Negative.   HENT: Negative.   Eyes: Negative.   Respiratory: Negative.   Cardiovascular: Negative.   Gastrointestinal: Negative.   Musculoskeletal: Negative.   Skin: Negative.   Neurological: Negative.   Psychiatric/Behavioral: Positive for depression, memory loss and substance abuse. Negative for hallucinations and suicidal ideas. The patient is nervous/anxious. The patient does not have insomnia.     Blood pressure 140/82, pulse 93, temperature 98.2 F (36.8 C), temperature source Oral, resp. rate 20, height 6' (1.829 m), weight 102.1 kg (225 lb), SpO2 100 %.Body mass index is 30.52 kg/m.  General Appearance: Casual  Eye Contact:  Good  Speech:  Clear and Coherent  Volume:  Decreased  Mood:  Euthymic  Affect:  Constricted  Thought Process:  Goal Directed  Orientation:  Full (Time, Place, and Person)  Thought Content:  Logical  Suicidal Thoughts:  No  Homicidal Thoughts:  No  Memory:  Immediate;   Poor Recent;   Fair Remote;   Fair  Judgement:  Impaired  Insight:  Shallow  Psychomotor Activity:  Decreased  Concentration:  Concentration: Fair  Recall:  [employer] of Knowledge:  Fair  Language:  Fair  Akathisia:  No  Handed:  Right  AIMS (if indicated):     Assets:  Desire for Improvement Housing Social Support  ADL's:  Intact  Cognition:  WNL  Sleep:        Treatment Plan Summary: Plan 57 year old man with alcohol abuse and substance induced mood disorder.  Patient no longer meets commitment criteria.  I offered him the option of being referred to residential treatment services but he declines because he feels it is more important to get home to take care of his wife.  Patient is not acutely suicidal.  We had a talk about alcohol abuse and its treatment and consequences.  Patient is strongly encouraged to continue or restart following up at [facility] and also to start going to Alcoholics Anonymous meetings as much as possible and as soon as possible.  No  prescriptions written.  Discontinue IVC.  Case reviewed with emergency room physician and nursing.  Disposition: No evidence of imminent risk to self or others at present.   Patient does not meet criteria for psychiatric inpatient admission. Supportive therapy provided about ongoing stressors.  Discussed crisis plan, support from social network, calling 911, coming to the Emergency Department, and calling Suicide Hotline.  Alethia Berthold, MD 03/10/2018 12:56 PM

## 2018-03-10 NOTE — Discharge Instructions (Signed)
As we discussed, your recent work-ups have been reassuring and your lungs are clear.  We believe your shortness of breath and wheezing are likely related more to your anxiety.  Please go home and get a good night sleep and follow-up with the outpatient resources you were given yesterday.  Return to the emergency department if you develop new or worsening symptoms that concern you.

## 2018-03-10 NOTE — ED Notes (Signed)
Pt ambulatory in lobby without difficulty or distress noted, st he is leaving now; pt called for taxi

## 2018-03-10 NOTE — ED Notes (Signed)
Pt ambulatory in lobby without difficulty or distress noted

## 2018-03-10 NOTE — ED Notes (Signed)
Patient has cuts to right hand that are bandaged. Patient states he was watching Miguel [name] movies and started messing with his knives and numb chucks and accidentally cut self.

## 2018-03-10 NOTE — Progress Notes (Addendum)
Pt A & O to self, place and situation when assessed. Observed to  be restless, anxious with increasing agitation. Pt seen banging on the wall in his room with his injured hand, blood noted on the wall at the time and was cleaned by staff. Pt was argumentative, refused to stop banging on the wall when verbal redirection was attempted by nursing staff and officer. Dr. Joni Fears made aware, new order received for Haldol 5 mg PO and Benadryl 50 mg PO. Medication given as ordered. Pt awake sitting on bed at this time; "I will try to cooperate, I'm sorry: Continued support and encouragement provided.

## 2018-03-10 NOTE — ED Notes (Signed)
Pt given lunch tray.

## 2018-03-10 NOTE — ED Notes (Signed)
Pt belongings: red pants, gray shirt, brown shoes, blue underwear, 2 inhalers, wallet, keys, cell phone given to patient.  Patient agrees that all belongings returned.  Patient to lobby to call cab.

## 2018-03-10 NOTE — ED Provider Notes (Signed)
[facility] Emergency Department Provider Note  ____________________________________________   First MD Initiated Contact with Patient 03/10/18 0028     (approximate)  I have reviewed the triage vital signs and the nursing notes.   HISTORY  Chief Complaint Abdominal Pain    HPI Miguel [name] is a 57 y.o. male who is well-known to this emergency department with 11 visits in the last 6 months and a history as listed below which notably includes hypertension, OCD, anxiety, asthma, and alcohol abuse (although he is improved in that regard).  He was seen about 24 hours ago for the same symptoms and presents tonight for shortness of breath.  He states he was wheezing at home.  He told me that he was wheezing while he was waiting out front but as soon as he got a room it went away.  He is concerned that may be has some to do with his house.  He had a full and thorough evaluation by Dr. Mariea Clonts in the ED yesterday which revealed no acute abnormalities.  She had a long talk with him about the role that anxiety and psychiatric illness play with his symptoms including chronic abdominal pain and shortness of breath.  He planned to try to follow-up with outpatient psychiatry but is historically noncompliant with this plan.  He reports that he has not followed up with anyone.  He has not been drinking alcohol recently.  His shortness of breath is completely resolved and is no longer wheezing.  He states he has pain in his lower abdomen which she always has.  Nothing in particular made his symptoms worse and his shortness of breath resolved upon getting a room.  Past Medical History:  Diagnosis Date  . Alcohol abuse   . Asthma   . GERD (gastroesophageal reflux disease)   . Gout   . Hypertension   . OCD (obsessive compulsive disorder)   . Renal colic     Patient Active Problem List   Diagnosis Date Noted  . Leg hematoma 12/25/2017  . Suicide and self-inflicted injury by  cutting and piercing instrument ([place]) 03/10/2017  . Severe recurrent major 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 ([place]) 03/09/2017  . Substance induced mood disorder ([place]) 08/15/2016  . Involuntary commitment 08/15/2016  . Alcohol use disorder, severe, dependence ([place]) 02/05/2016  . Hypertension 12/05/2015  . Tachycardia 12/05/2015  . Gout 11/13/2015  . Chronic back pain 05/02/2015    Past Surgical History:  Procedure Laterality Date  . CHOLECYSTECTOMY  2012    Prior to Admission medications   Medication Sig Start Date End Date Taking? Authorizing Provider  ADVAIR DISKUS 100-50 MCG/DOSE AEPB INHALE 1 PUFF 2 TIMES A DAY. RINSE MOUTH AND SPIT AFTER EACH USE. 10/18/17   Tawni Millers, MD  allopurinol (ZYLOPRIM) 300 MG tablet Take 300 mg by mouth daily.    [provider]  chlordiazePOXIDE (LIBRIUM) 25 MG capsule 50 mg 3 times a day x 3 days 50 mg 2 times a day for 3 days 50 mg once a day for 3 days stop 01/18/18   Loney Hering, MD  dexlansoprazole (DEXILANT) 60 MG capsule Take 60 mg by mouth every morning.    [provider]  ferrous sulfate 325 (65 FE) MG tablet Take 1 tablet (325 mg total) by mouth 2 (two) times daily with a meal. 12/28/17   Gladstone Lighter, MD  HYDROcodone-acetaminophen (NORCO) 5-325 MG tablet Take 1 tablet by mouth every 6 (six) hours as needed for  up to 7 doses for severe pain. 03/02/18   Darel Hong, MD  lidocaine (LIDODERM) 5 % Place 1 patch onto the skin every 12 (twelve) hours. Remove & Discard patch within 12 hours or as directed by MD 01/18/18 01/18/19  Loney Hering, MD  lisinopril (PRINIVIL,ZESTRIL) 20 MG tablet Take 1 tablet (20 mg total) by mouth every morning. 12/28/17   Gladstone Lighter, MD  LORazepam (ATIVAN) 1 MG tablet Take 1 tablet (1 mg total) by mouth every 8 (eight) hours as needed for anxiety. 03/08/18 03/08/19  Eula Listen, MD  omeprazole (PRILOSEC) 40 MG capsule Take 1 capsule (40 mg total) by mouth daily.  03/02/18 03/02/19  Darel Hong, MD  tamsulosin (FLOMAX) 0.4 MG CAPS capsule Take 1 capsule (0.4 mg total) by mouth daily. 12/28/17   Gladstone Lighter, MD  traZODone (DESYREL) 50 MG tablet Take 1 tablet (50 mg total) by mouth at bedtime as needed for sleep. 12/28/17   Gladstone Lighter, MD  VENTOLIN HFA 108 (90 Base) MCG/ACT inhaler INHALE 2 PUFFS EVERY 4 HOURS AS NEEDED 10/18/17   Tawni Millers, MD    Allergies Patient has no known allergies.  Family History  Problem Relation Age of Onset  . Alcohol abuse Father     Social History Social History   Tobacco Use  . Smoking status: Former Research scientist (life sciences)  . Smokeless tobacco: Never Used  . Tobacco comment: quit 30 years ago  Substance Use Topics  . Alcohol use: Yes    Alcohol/week: 4.2 oz    Types: 7 Glasses of wine per week    Comment: Pt states, "I quit all the hard stuff 4 months ago"  . Drug use: No    Review of Systems Constitutional: No fever/chills Eyes: No visual changes. ENT: No sore throat. Cardiovascular: Denies chest pain. Respiratory: Shortness of breath as described above, now resolved Gastrointestinal: Chronic lower abdominal pain.  No nausea, no vomiting.  No diarrhea.  No constipation. Genitourinary: Negative for dysuria. Musculoskeletal: Negative for neck pain.  Negative for back pain. Integumentary: Negative for rash. Neurological: Negative for headaches, focal weakness or numbness. Psychiatric:Anxiety  ____________________________________________   PHYSICAL EXAM:  VITAL SIGNS: ED Triage Vitals [03/09/18 2216]  Enc Vitals Group     BP (!) 154/83     Pulse Rate (!) 101     Resp 18     Temp 98.8 F (37.1 C)     Temp Source Oral     SpO2 100 %     Weight 102.1 kg (225 lb)     Height 1.829 m (6')     Head Circumference      Peak Flow      Pain Score 9     Pain Loc      Pain Edu?      Excl. in [place]?     Constitutional: Alert and oriented. Well appearing and in no acute distress. Eyes:  Conjunctivae are normal.  Head: Atraumatic. Nose: No congestion/rhinnorhea. Mouth/Throat: Mucous membranes are moist. Neck: No stridor.  No meningeal signs.   Cardiovascular: Normal rate, regular rhythm. Good peripheral circulation. Grossly normal heart sounds. Respiratory: Normal respiratory effort.  No retractions. Lungs CTAB. Gastrointestinal: Soft and nontender. No distention.  Musculoskeletal: No lower extremity tenderness nor edema. No gross deformities of extremities. Neurologic:  Normal speech and language. No gross focal neurologic deficits are appreciated.  Skin:  Skin is warm, dry and intact. No rash noted. Psychiatric: Mood and affect are anxious but essentially normal and at  this patient's baseline  ____________________________________________   LABS (all labs ordered are listed, but only abnormal results are displayed)  Labs Reviewed - No data to display ____________________________________________  EKG  None - EKG not ordered by ED physician ____________________________________________  RADIOLOGY   ED MD interpretation: No imaging ordered  Official radiology report(s): No results found.  ____________________________________________   PROCEDURES  Critical Care performed: No   Procedure(s) performed:   Procedures   ____________________________________________   INITIAL IMPRESSION / ASSESSMENT AND PLAN / ED COURSE  As part of my medical decision making, I reviewed the following data within the [place] notes reviewed and incorporated, Old chart reviewed and Notes from prior ED visits    Differential diagnoses includes asthma, COPD, pneumonia, and anxiety.  However I strongly feel that anxiety as well as some degree of malingering account for the patient's symptoms.  He frequently comes to the emergency department for reassurance and I think that this was likely the case tonight.  He told me himself that his symptoms  completely resolved after getting a room.  His lungs are clear to auscultation bilaterally with no wheezing, no rales, no rhonchi.  I had a lengthy talk with him at bedside about the role that his psychiatric illness plays with his symptoms and encouraged him to follow-up as an outpatient.  Given that he just had a thorough evaluation yesterday, his vitals are normal, and his symptoms have resolved, I do not feel that there is any benefit to repeating any work-up tonight.  I gave my usual and customary return precautions.  He understands and agrees with the plan.     ____________________________________________  FINAL CLINICAL IMPRESSION(S) / ED DIAGNOSES  Final diagnoses:  Dyspnea, unspecified type     MEDICATIONS GIVEN DURING THIS VISIT:  Medications - No data to display   ED Discharge Orders    None       Note:  This document was prepared using Dragon voice recognition software and may include unintentional dictation errors.    Hinda Kehr, MD 03/10/18 [phone]

## 2018-03-10 NOTE — ED Notes (Signed)
Patient went to bathroom and took off bandage. Areas on right palm are superficial with no bleeding noted. This writer cleaned right palm of hand with normal saline and covered with bandage.

## 2018-03-10 NOTE — ED Notes (Signed)
Pt states that he was here a few days ago. He says that the past few days he has had lower abdominal pain. Pt is alert and oriented x 4.

## 2018-03-10 NOTE — ED Triage Notes (Signed)
Patient c/o possible foreign body stuck in throat for approx 1 hour. Patient says it may be a protein bar, he does not remember everything he ate. Patient speaking in complete sentences, normal respirations, maintaining control of secretions.   Patient reports he was seen here earlier today for abdominal pain, and laceration to hand.

## 2018-03-10 NOTE — ED Provider Notes (Addendum)
[facility] Emergency Department Provider Note  ____________________________________________  Time seen: Approximately 7:39 AM  I have reviewed the triage vital signs and the nursing notes.   HISTORY  Chief Complaint Hand Injury    HPI Miguel [name] is a 57 y.o. male who is brought to the ED under involuntary commitment due to self-injurious behavior and alcohol intoxication. The patient has a history of alcohol abuse, hypertension, depression and recurrent self-harm behaviors. He has frequent visits to this ED for psychiatric issues and a range of other medical complaints.   The patient reports that around sunset yesterday, he was practicing his martial arts with a weapon that has 3 sharp blades on it, when he inadvertently cut his right palm multiple times. He attributes this to it being dark out. He is not concerned about the injuries. He denies intentionally harming himself or suicide attempt. He does endorse feeling depressed owing to financial strain and his wife's chronic illnesses.  Denies HI or hallucinations. Doesn't take antidepressants. Drinks frequently including last night. He describes his depressed mood is chronic, constant, waxing and waning, no aggravating or alleviating factors.  According to the electronic medical record, last tetanus was 08/16/2017  just prior to my arrival in the treatment room to evaluate the patient, nursing had noticed that the patient had tied his pillowcase tightly around his neck. Patient denies trying to hurt himself. His pillowcase and shirt were removed from the room for safety.    Past Medical History:  Diagnosis Date  . Alcohol abuse   . Asthma   . GERD (gastroesophageal reflux disease)   . Gout   . Hypertension   . OCD (obsessive compulsive disorder)   . Renal colic      Patient Active Problem List   Diagnosis Date Noted  . Leg hematoma 12/25/2017  . Suicide and self-inflicted injury by cutting and  piercing instrument ([place]) 03/10/2017  . Severe recurrent major 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 ([place]) 03/09/2017  . Substance induced mood disorder ([place]) 08/15/2016  . Involuntary commitment 08/15/2016  . Alcohol use disorder, severe, dependence ([place]) 02/05/2016  . Hypertension 12/05/2015  . Tachycardia 12/05/2015  . Gout 11/13/2015  . Chronic back pain 05/02/2015     Past Surgical History:  Procedure Laterality Date  . CHOLECYSTECTOMY  2012     Prior to Admission medications   Medication Sig Start Date End Date Taking? Authorizing Provider  ADVAIR DISKUS 100-50 MCG/DOSE AEPB INHALE 1 PUFF 2 TIMES A DAY. RINSE MOUTH AND SPIT AFTER EACH USE. 10/18/17   Tawni Millers, MD  allopurinol (ZYLOPRIM) 300 MG tablet Take 300 mg by mouth daily.    [provider]  chlordiazePOXIDE (LIBRIUM) 25 MG capsule 50 mg 3 times a day x 3 days 50 mg 2 times a day for 3 days 50 mg once a day for 3 days stop 01/18/18   Loney Hering, MD  dexlansoprazole (DEXILANT) 60 MG capsule Take 60 mg by mouth every morning.    [provider]  ferrous sulfate 325 (65 FE) MG tablet Take 1 tablet (325 mg total) by mouth 2 (two) times daily with a meal. 12/28/17   Gladstone Lighter, MD  HYDROcodone-acetaminophen (NORCO) 5-325 MG tablet Take 1 tablet by mouth every 6 (six) hours as needed for up to 7 doses for severe pain. 03/02/18   Darel Hong, MD  lidocaine (LIDODERM) 5 % Place 1 patch onto the skin every 12 (twelve) hours. Remove & Discard patch within 12 hours  or as directed by MD 01/18/18 01/18/19  Loney Hering, MD  lisinopril (PRINIVIL,ZESTRIL) 20 MG tablet Take 1 tablet (20 mg total) by mouth every morning. 12/28/17   Gladstone Lighter, MD  LORazepam (ATIVAN) 1 MG tablet Take 1 tablet (1 mg total) by mouth every 8 (eight) hours as needed for anxiety. 03/08/18 03/08/19  Eula Listen, MD  omeprazole (PRILOSEC) 40 MG capsule Take 1 capsule (40 mg total) by mouth daily. 03/02/18  03/02/19  Darel Hong, MD  tamsulosin (FLOMAX) 0.4 MG CAPS capsule Take 1 capsule (0.4 mg total) by mouth daily. 12/28/17   Gladstone Lighter, MD  traZODone (DESYREL) 50 MG tablet Take 1 tablet (50 mg total) by mouth at bedtime as needed for sleep. 12/28/17   Gladstone Lighter, MD  VENTOLIN HFA 108 (90 Base) MCG/ACT inhaler INHALE 2 PUFFS EVERY 4 HOURS AS NEEDED 10/18/17   Tawni Millers, MD     Allergies Patient has no known allergies.   Family History  Problem Relation Age of Onset  . Alcohol abuse Father     Social History Social History   Tobacco Use  . Smoking status: Former Research scientist (life sciences)  . Smokeless tobacco: Never Used  . Tobacco comment: quit 30 years ago  Substance Use Topics  . Alcohol use: Yes    Alcohol/week: 4.2 oz    Types: 7 Glasses of wine per week    Comment: Pt states, "I quit all the hard stuff 4 months ago"  . Drug use: No    Review of Systems  Constitutional:   No fever or chills.  ENT:   No sore throat. No rhinorrhea. Cardiovascular:   No chest pain or syncope. Respiratory:   No dyspnea or cough. Gastrointestinal:   positive for generalized abdominal pain without vomiting and diarrhea.  Musculoskeletal:  right palm pain All other systems reviewed and are negative except as documented above in ROS and HPI.  ____________________________________________   PHYSICAL EXAM:  VITAL SIGNS: ED Triage Vitals  Enc Vitals Group     BP 03/10/18 0621 (!) 149/100     Pulse Rate 03/10/18 0621 97     Resp 03/10/18 0621 18     Temp 03/10/18 0621 98.1 F (36.7 C)     Temp Source 03/10/18 0621 Oral     SpO2 03/10/18 0621 98 %     Weight 03/10/18 0622 225 lb (102.1 kg)     Height 03/10/18 0622 6' (1.829 m)     Head Circumference --      Peak Flow --      Pain Score --      Pain Loc --      Pain Edu? --      Excl. in [place]? --     Vital signs reviewed, nursing assessments reviewed.   Constitutional:   Alert and oriented. Well appearing and in no  distress. Eyes:   Conjunctivae are normal. EOMI. ENT      Head:   Normocephalic and atraumatic.      Nose:   No congestion/rhinnorhea.       Mouth/Throat:   MMM, no pharyngeal erythema. No peritonsillar mass. alcohol on breath      Neck:   No meningismus. Full ROM.faintly erythematous diffusely without petechia. Nontender. No pulsatile mass or bruit. Hematological/Lymphatic/Immunilogical:   No cervical lymphadenopathy. Cardiovascular:   RRR. Symmetric bilateral radial and DP pulses.  No murmurs.  Respiratory:   Normal respiratory effort without tachypnea/retractions. Breath sounds are clear and equal bilaterally. No wheezes/rales/rhonchi.  Gastrointestinal:   Soft without focal tenderness. Non distended. There is no CVA tenderness.  No rebound, rigidity, or guarding. Musculoskeletal:   Normal range of motion in all extremities. No joint effusions.  No lower extremity tenderness.  No edema. Neurologic:   Normal speech and language.  Motor grossly intact. No acute focal neurologic deficits are appreciated.  Skin:    Skin is warm, dry with innumerable linear superficial abrasions on the right palm. No laceration, hemostatic. No rash.  ____________________________________________    LABS (pertinent positives/negatives) (all labs ordered are listed, but only abnormal results are displayed) Labs Reviewed  COMPREHENSIVE METABOLIC PANEL - Abnormal; Notable for the following components:      Result Value   Potassium 3.3 (*)    CO2 21 (*)    Glucose, Bld 114 (*)    AST 14 (*)    ALT 12 (*)    All other components within normal limits  ETHANOL - Abnormal; Notable for the following components:   Alcohol, Ethyl (B) 246 (*)    All other components within normal limits  ACETAMINOPHEN LEVEL - Abnormal; Notable for the following components:   Acetaminophen (Tylenol), Serum <10 (*)    All other components within normal limits  CBC - Abnormal; Notable for the following components:   Hemoglobin  11.3 (*)    HCT 35.0 (*)    MCV 72.9 (*)    MCH 23.4 (*)    RDW 21.0 (*)    All other components within normal limits  SALICYLATE LEVEL  URINE DRUG SCREEN, QUALITATIVE ([facility] ONLY)   ____________________________________________   EKG    ____________________________________________    RADIOLOGY  No results found.  ____________________________________________   PROCEDURES Procedures  ____________________________________________  DIFFERENTIAL DIAGNOSIS   alcohol intoxication, depression, malingering, chronic abdominal pain  CLINICAL IMPRESSION / ASSESSMENT AND PLAN / ED COURSE  Pertinent labs & imaging results that were available during my care of the patient were reviewed by me and considered in my medical decision making (see chart for details).    patient well-appearing no acute distress presents with unremarkable vital signs, presents with alcohol intoxication and minor self-inflicted injury, undetermined intent. He arrives under involuntary commitment initiated by police. We will continue the commitment until he can obtain a psychiatric evaluation here.  will continue safety checks and maintain a safe environment for the patient in the meantime.labs are unremarkable except for an ethanol level of 246. Tetanus is up-to-date from October 2018.  Clinical Course as of Mar 10 1246  Thu Mar 10, 2018  0838 patient now practicing his martial arts maneuvers in the treatment room. Banging his injured hand on the wall smearing blood. He perseverates on getting home and his wife's illnesses, he is not redirectable. Thoughts are disorganized. I will give Benadryl and Haldol to calm him.if he will not take PO we will administer IM.    [PS]  3710 case discussed with Dr. Weber Cooks after his evaluation of the patient in the ED. Feels the patient is not a danger to himself or others, this is recurrent chronic behavior for him where he tends to play with knives when he gets drunk. He's been  counseled at length on his need to stop abusing alcohol. We'll discharge patient home.   [PS]    Clinical Course User Index [PS] Carrie Mew, MD     ____________________________________________   FINAL CLINICAL IMPRESSION(S) / ED DIAGNOSES    Final diagnoses:  Injury of right hand, initial encounter  Alcoholic intoxication with  complication [facility])     ED Discharge Orders    None      Portions of this note were generated with dragon dictation software. Dictation errors may occur despite best attempts at proofreading.    Carrie Mew, MD 03/10/18 5366    Carrie Mew, MD 03/10/18 1247

## 2018-03-10 NOTE — ED Notes (Signed)

## 2018-03-10 NOTE — ED Notes (Signed)
Agricultural consultant note:  Pt to ED via EMS c/o choking sensation, VSS stable per EMS, lungs clear, no stridor noted upon arrival, pt A&Ox4, chest rise even and unlabored.  Pt sent to triage via EMS.

## 2018-03-10 NOTE — ED Notes (Signed)
Pt. Alert and oriented, warm and dry, in no distress. Pt. Denies SI, HI. Patient states hearing and seeing wife. Patient is tearful during assessment. Patient was dressed out by this Probation officer and ED tech Sunny Schlein. Belongings placed in bag and labeled with proper patient info.  Pt. Encouraged to let nursing staff know of any concerns or needs.

## 2018-03-10 NOTE — BH Assessment (Addendum)
Assessment Note  Miguel [name] is an 57 y.o. male  who presents to the ED in custody of [place] [employer] for IVC. He is well known to the ED with 11 visits within the past six months. Many visits included intoxication and self-inflicted lacerations to various parts of his body.  Pt was discharged from the ED around 2am this morning and has returned less than 5 hours later with large superficial cuts to the inside of his right palm. Pt reports that he does martial arts and was performing some moves, he saw in a [employer], outside his home in the dark. He reports that he cut his hand with a knife called "Hand of the Dragon" which is a three-pronged bladed knife.  Pt was reluctant to admit to his drinking habit initially however when asked about his eating habits he reports that he eats once a day because he would rather drink etoh instead of eat for his other meals. His BAL upon arrival was 246. Pt denied illicit drug use however, he tested positive for Cannabinoid and Benzos. He reports that he is unemployed and lives in a boarding house with his wife who is chronically ill. They are both living off her $800 disability check.   During the assessment the pt was calm and cooperative. Several times he became tearful when talking about his wife and/or living situation. When asked assessment questions he answered them using his wife's information and had to be reminded several times that, the questions were about him and not his wife. He reports an hx of depression and anxiety but states that he does not have a therapist or psychiatrist that he sees on a regular basis. He denies SI/HI A/V H D.   Diagnosis: Depression  Past Medical History:  Past Medical History:  Diagnosis Date  . Alcohol abuse   . Asthma   . GERD (gastroesophageal reflux disease)   . Gout   . Hypertension   . OCD (obsessive compulsive disorder)   . Renal colic     Past Surgical History:  Procedure Laterality Date  .  CHOLECYSTECTOMY  2012    Family History:  Family History  Problem Relation Age of Onset  . Alcohol abuse Father     Social History:  reports that he has quit smoking. He has never used smokeless tobacco. He reports that he drinks about 4.2 oz of alcohol per week. He reports that he does not use drugs.  Additional Social History:  Alcohol / Drug Use Pain Medications: See MAR Prescriptions: See MAR Over the Counter: See MAR History of alcohol / drug use?: Yes Substance #1 Name of Substance 1: ETOH 1 - Age of First Use: 14 1 - Last Use / Amount: Daily  CIWA: CIWA-Ar BP: 140/82 Pulse Rate: 93 COWS:    Allergies: No Known Allergies  Home Medications:  (Not in a hospital admission)  OB/GYN Status:  No LMP for male patient.  General Assessment Data Location of Assessment: [facility] ED TTS Assessment: In system Is this a Tele or Face-to-Face Assessment?: Face-to-Face Is this an Initial Assessment or a Re-assessment for this encounter?: Initial Assessment Marital status: Married Is patient pregnant?: No Pregnancy Status: No Living Arrangements: Spouse/significant other Can pt return to current living arrangement?: Yes Admission Status: Involuntary Is patient capable of signing voluntary admission?: No Referral Source: Self/Family/Friend Insurance type: Medicaid  Medical Screening Exam ([place]) Medical Exam completed: Yes  Crisis Care Plan Living Arrangements: Spouse/significant other Legal Guardian:  Other:(Self) Name of Psychiatrist: n/a Name of Therapist: n/a  Education Status Is patient currently in school?: No Is the patient employed, unemployed or receiving disability?: Unemployed  Risk to self with the past 6 months Suicidal Ideation: No Has patient been a risk to self within the past 6 months prior to admission? : Yes Suicidal Intent: No Has patient had any suicidal intent within the past 6 months prior to admission? : No Is patient at risk for  suicide?: No Suicidal Plan?: No Has patient had any suicidal plan within the past 6 months prior to admission? : No Access to Means: Yes Specify Access to Suicidal Means: Knives What has been your use of drugs/alcohol within the last 12 months?: Pt used etoh daily Previous Attempts/Gestures: Yes How many times?: 1 Other Self Harm Risks: Pt is an active cutter Triggers for Past Attempts: Unknown Intentional Self Injurious Behavior: Cutting, Damaging Comment - Self Injurious Behavior: pt cuts hands, arms, and legs Family Suicide History: Unknown Recent stressful life event(s): Other (Comment), Financial Problems(Depression) Persecutory voices/beliefs?: No Depression: Yes Depression Symptoms: Tearfulness, Loss of interest in usual pleasures, Feeling worthless/self pity, Feeling angry/irritable Substance abuse history and/or treatment for substance abuse?: No Suicide prevention information given to non-admitted patients: Not applicable  Risk to Others within the past 6 months Homicidal Ideation: No Does patient have any lifetime risk of violence toward others beyond the six months prior to admission? : No Thoughts of Harm to Others: No Current Homicidal Intent: No Current Homicidal Plan: No Access to Homicidal Means: No Identified Victim: n/a History of harm to others?: No Assessment of Violence: None Noted Violent Behavior Description: pt denies Does patient have access to weapons?: Yes (Comment) Criminal Charges Pending?: No Does patient have a court date: No Is patient on probation?: No  Psychosis Hallucinations: None noted Delusions: None noted  Mental Status Report Appearance/Hygiene: In scrubs Eye Contact: Good Motor Activity: Freedom of movement, Tremors Speech: Slow, Logical/coherent, Soft Level of Consciousness: Alert Mood: Depressed, Anxious, Sad, Helpless Affect: Appropriate to circumstance, Anxious, Sad, Depressed Anxiety Level: Minimal Thought Processes:  Coherent, Relevant Judgement: Partial Orientation: Person, Place, Situation, Time, Appropriate for developmental age Obsessive Compulsive Thoughts/Behaviors: Minimal  Cognitive Functioning Concentration: Normal Memory: Recent Intact, Remote Intact Is patient IDD: No Is patient DD?: No Insight: Fair Impulse Control: Poor Appetite: Poor Have you had any weight changes? : No Change Sleep: Decreased Total Hours of Sleep: 3 Vegetative Symptoms: Staying in bed  ADLScreening [facility] Assessment Services) Patient's cognitive ability adequate to safely complete daily activities?: Yes Patient able to express need for assistance with ADLs?: Yes Independently performs ADLs?: Yes (appropriate for developmental age)  Prior Inpatient Therapy Prior Inpatient Therapy: No  Prior Outpatient Therapy Prior Outpatient Therapy: No Does patient have an ACCT team?: No Does patient have Intensive In-House Services?  : No Does patient have [facility] services? : No Does patient have P4CC services?: No  ADL Screening (condition at time of admission) Patient's cognitive ability adequate to safely complete daily activities?: Yes Is the patient deaf or have difficulty hearing?: No Does the patient have difficulty seeing, even when wearing glasses/contacts?: No Does the patient have difficulty concentrating, remembering, or making decisions?: No Patient able to express need for assistance with ADLs?: Yes Does the patient have difficulty dressing or bathing?: No Independently performs ADLs?: Yes (appropriate for developmental age) Does the patient have difficulty walking or climbing stairs?: No Weakness of Legs: None Weakness of Arms/Hands: None  Home Assistive Devices/Equipment Home Assistive Devices/Equipment:  None  Therapy Consults (therapy consults require a physician order) PT Evaluation Needed: No OT Evalulation Needed: No SLP Evaluation Needed: No Abuse/Neglect Assessment (Assessment to be  complete while patient is alone) Abuse/Neglect Assessment Can Be Completed: Yes Physical Abuse: Denies Verbal Abuse: Denies Sexual Abuse: Denies Exploitation of patient/patient's resources: Denies Self-Neglect: Denies, provider concerned (Comment)(Pt reports no self-neglect however he has been to the ED several times due to self-infilcted lacerations) Values / Beliefs Cultural Requests During Hospitalization: None Spiritual Requests During Hospitalization: None Consults Spiritual Care Consult Needed: No Social Work Consult Needed: No      Additional Information 1:1 In Past 12 Months?: No CIRT Risk: No Elopement Risk: No Does patient have medical clearance?: Yes  Child/Adolescent Assessment Running Away Risk: Denies Bed-Wetting: Denies Destruction of Property: Denies Cruelty to Animals: Denies Stealing: Denies Rebellious/Defies Authority: Denies Satanic Involvement: Denies Science writer: Denies Problems at [employer]: Denies Gang Involvement: Denies  Disposition:  Disposition Initial Assessment Completed for this Encounter: Yes Disposition of Patient: (Pending evaluation) Patient refused recommended treatment: No Mode of transportation if patient is discharged?: Car  On Site Evaluation by:   Reviewed with Physician:    Ashlley Booher D Rolan Wrightsman 03/10/2018 12:11 PM

## 2018-03-10 NOTE — ED Notes (Signed)
Patient unable to give urine sample at this time

## 2018-03-31 ENCOUNTER — Other Ambulatory Visit: Payer: Self-pay

## 2018-03-31 ENCOUNTER — Encounter: Payer: Self-pay | Admitting: Emergency Medicine

## 2018-03-31 DIAGNOSIS — R0789 Other chest pain: Secondary | ICD-10-CM | POA: Insufficient documentation

## 2018-03-31 DIAGNOSIS — J45909 Unspecified asthma, uncomplicated: Secondary | ICD-10-CM | POA: Insufficient documentation

## 2018-03-31 DIAGNOSIS — Z87891 Personal history of nicotine dependence: Secondary | ICD-10-CM | POA: Insufficient documentation

## 2018-03-31 DIAGNOSIS — F10239 Alcohol dependence with withdrawal, unspecified: Secondary | ICD-10-CM | POA: Insufficient documentation

## 2018-03-31 DIAGNOSIS — I10 Essential (primary) hypertension: Secondary | ICD-10-CM | POA: Insufficient documentation

## 2018-03-31 DIAGNOSIS — Z79899 Other long term (current) drug therapy: Secondary | ICD-10-CM | POA: Insufficient documentation

## 2018-03-31 NOTE — ED Triage Notes (Signed)
Pt to ED via [facility] from home for anxiety and fall. Pt is in NAD at this time.

## 2018-04-01 ENCOUNTER — Emergency Department
Admission: EM | Admit: 2018-04-01 | Discharge: 2018-04-01 | Disposition: A | Discharge status: Home / Self Care | Payer: Self-pay | Attending: Emergency Medicine | Admitting: Emergency Medicine

## 2018-04-01 ENCOUNTER — Emergency Department: Payer: Self-pay

## 2018-04-01 DIAGNOSIS — F10239 Alcohol dependence with withdrawal, unspecified: Secondary | ICD-10-CM

## 2018-04-01 DIAGNOSIS — F10939 Alcohol use, unspecified with withdrawal, unspecified: Secondary | ICD-10-CM

## 2018-04-01 DIAGNOSIS — F1010 Alcohol abuse, uncomplicated: Secondary | ICD-10-CM

## 2018-04-01 MED ORDER — IBUPROFEN 600 MG PO TABS
600.0000 mg | ORAL_TABLET | Freq: Once | ORAL | Status: AC
  Administered 2018-04-01: 600 mg via ORAL
  Filled 2018-04-01: qty 1

## 2018-04-01 MED ORDER — CHLORDIAZEPOXIDE HCL 25 MG PO CAPS: 25.0000 mg | ORAL_CAPSULE | Freq: Three times a day (TID) | ORAL | 0 refills | Status: DC | PRN

## 2018-04-01 MED ORDER — DICYCLOMINE HCL 10 MG PO CAPS
20.0000 mg | ORAL_CAPSULE | Freq: Once | ORAL | Status: AC
  Administered 2018-04-01: 20 mg via ORAL
  Filled 2018-04-01: qty 2

## 2018-04-01 MED ORDER — CHLORDIAZEPOXIDE HCL 25 MG PO CAPS
50.0000 mg | ORAL_CAPSULE | Freq: Once | ORAL | Status: AC
  Administered 2018-04-01: 50 mg via ORAL
  Filled 2018-04-01: qty 2

## 2018-04-01 NOTE — ED Provider Notes (Signed)
[facility] Emergency Department Provider Note  ____________________________________________   First MD Initiated Contact with Patient 04/01/18 0036     (approximate)  I have reviewed the triage vital signs and the nursing notes.   HISTORY  Chief Complaint Anxiety and Fall   HPI Miguel [name] is a 57 y.o. male who comes the emergency department via EMS with right chest pain after sustaining a mechanical fall yesterday morning.  He said he was getting up to take out the trash when he tripped fell onto his right side.  He had sudden onset mild to moderate right chest pain that is persisted ever since.  Pain is worse when taking a deep breath and improved with rest.  He also comes to the emergency department because he is an alcoholic and he drinks every day and does not know how to quit.  His last drink was earlier today.  He feels somewhat anxious and tremulous.  He goes into withdrawals if he does not continue to drink.  He denies suicidal ideation.  He does feel safe at home.  He has not tried Alcoholics Anonymous or any other way to stop quitting because he has never actually wanted to quit until today.  Past Medical History:  Diagnosis Date  . Alcohol abuse   . Asthma   . GERD (gastroesophageal reflux disease)   . Gout   . Hypertension   . OCD (obsessive compulsive disorder)   . Renal colic     Patient Active Problem List   Diagnosis Date Noted  . Self-inflicted laceration of wrist 03/10/2018  . Leg hematoma 12/25/2017  . Suicide and self-inflicted injury by cutting and piercing instrument ([place]) 03/10/2017  . Severe recurrent major 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 ([place]) 03/09/2017  . Substance induced mood disorder ([place]) 08/15/2016  . Involuntary commitment 08/15/2016  . Alcohol use disorder, severe, dependence ([place]) 02/05/2016  . Hypertension 12/05/2015  . Tachycardia 12/05/2015  . Gout 11/13/2015  . Chronic back pain 05/02/2015    Past  Surgical History:  Procedure Laterality Date  . CHOLECYSTECTOMY  2012    Prior to Admission medications   Medication Sig Start Date End Date Taking? Authorizing Provider  ADVAIR DISKUS 100-50 MCG/DOSE AEPB INHALE 1 PUFF 2 TIMES A DAY. RINSE MOUTH AND SPIT AFTER EACH USE. 10/18/17   Tawni Millers, MD  allopurinol (ZYLOPRIM) 300 MG tablet Take 300 mg by mouth daily.    [provider]  chlordiazePOXIDE (LIBRIUM) 25 MG capsule Take 1 capsule (25 mg total) by mouth 3 (three) times daily as needed for withdrawal. 04/01/18   Darel Hong, MD  dexlansoprazole (DEXILANT) 60 MG capsule Take 60 mg by mouth every morning.    [provider]  ferrous sulfate 325 (65 FE) MG tablet Take 1 tablet (325 mg total) by mouth 2 (two) times daily with a meal. 12/28/17   Gladstone Lighter, MD  HYDROcodone-acetaminophen (NORCO) 5-325 MG tablet Take 1 tablet by mouth every 6 (six) hours as needed for up to 7 doses for severe pain. 03/02/18   Darel Hong, MD  lidocaine (LIDODERM) 5 % Place 1 patch onto the skin every 12 (twelve) hours. Remove & Discard patch within 12 hours or as directed by MD 01/18/18 01/18/19  Loney Hering, MD  lisinopril (PRINIVIL,ZESTRIL) 20 MG tablet Take 1 tablet (20 mg total) by mouth every morning. 12/28/17   Gladstone Lighter, MD  LORazepam (ATIVAN) 1 MG tablet Take 1 tablet (1 mg total) by mouth every  8 (eight) hours as needed for anxiety. 03/08/18 03/08/19  Eula Listen, MD  omeprazole (PRILOSEC) 40 MG capsule Take 1 capsule (40 mg total) by mouth daily. 03/02/18 03/02/19  Darel Hong, MD  tamsulosin (FLOMAX) 0.4 MG CAPS capsule Take 1 capsule (0.4 mg total) by mouth daily. 12/28/17   Gladstone Lighter, MD  traZODone (DESYREL) 50 MG tablet Take 1 tablet (50 mg total) by mouth at bedtime as needed for sleep. 12/28/17   Gladstone Lighter, MD  VENTOLIN HFA 108 (90 Base) MCG/ACT inhaler INHALE 2 PUFFS EVERY 4 HOURS AS NEEDED 10/18/17   Tawni Millers, MD     Allergies Patient has no known allergies.  Family History  Problem Relation Age of Onset  . Alcohol abuse Father     Social History Social History   Tobacco Use  . Smoking status: Former Research scientist (life sciences)  . Smokeless tobacco: Never Used  . Tobacco comment: quit 30 years ago  Substance Use Topics  . Alcohol use: Yes    Alcohol/week: 4.2 oz    Types: 7 Glasses of wine per week    Comment: Pt states, "I quit all the hard stuff 4 months ago"  . Drug use: No    Review of Systems Constitutional: No fever/chills Eyes: No visual changes. ENT: No sore throat. Cardiovascular: Positive for chest pain. Respiratory: Positive for shortness of breath. Gastrointestinal: No abdominal pain.  No nausea, no vomiting.  No diarrhea.  No constipation. Genitourinary: Negative for dysuria. Musculoskeletal: Negative for back pain. Skin: Negative for rash. Neurological: Negative for headaches, focal weakness or numbness.   ____________________________________________   PHYSICAL EXAM:  VITAL SIGNS: ED Triage Vitals [03/31/18 2220]  Enc Vitals Group     BP 128/76     Pulse Rate (!) 102     Resp 16     Temp 98.4 F (36.9 C)     Temp Source Oral     SpO2 97 %     Weight      Height      Head Circumference      Peak Flow      Pain Score 0     Pain Loc      Pain Edu?      Excl. in [place]?     Constitutional: Alert and oriented x4 tearful and mildly tremulous Eyes: PERRL EOMI. Head: Atraumatic. Nose: No congestion/rhinnorhea. Mouth/Throat: No trismus mild tongue fasciculations Neck: No stridor.   Cardiovascular: Tachycardic rate, regular rhythm. Grossly normal heart sounds.  Good peripheral circulation. Chest wall stable no crepitus Respiratory: Normal respiratory effort.  No retractions. Lungs CTAB and moving good air Gastrointestinal: Soft nontender Musculoskeletal: Ecchymosis to lateral right thigh appears about 24 hours old Mild hand tremors Neurologic:  Normal speech and language.  No gross focal neurologic deficits are appreciated. Skin:  Skin is warm, dry and intact. No rash noted. Psychiatric: Somewhat anxious appearing    ____________________________________________   DIFFERENTIAL includes but not limited to  Mechanical fall, alcohol addiction, alcohol intoxication, alcohol withdrawal, dehydration ____________________________________________   LABS (all labs ordered are listed, but only abnormal results are displayed)  Labs Reviewed - No data to display   __________________________________________  EKG  ED ECG REPORT I, Darel Hong, the attending physician, personally viewed and interpreted this ECG.  Date: 04/01/2018 EKG Time:  Rate: 87 Rhythm: Atrial fibrillation QRS Axis: normal Intervals: normal ST/T Wave abnormalities: normal Narrative Interpretation: no evidence of acute ischemia  ____________________________________________  RADIOLOGY  Chest x-ray reviewed by me with no  traumatic injury ____________________________________________   PROCEDURES  Procedure(s) performed: no  Procedures  Critical Care performed: no  Observation: no ____________________________________________   INITIAL IMPRESSION / ASSESSMENT AND PLAN / ED COURSE  Pertinent labs & imaging results that were available during my care of the patient were reviewed by me and considered in my medical decision making (see chart for details).  The patient arrives obviously withdrawing from alcohol.  He reports a mechanical fall and fortunately his imaging is negative.  Given chlordiazepoxide with improvement in his symptoms.  Had TTS come evaluate the patient and provide him with outpatient resources.  After chlordiazepoxide the patient's withdrawal is nearly completely resolved.  He does not require inpatient admission at this point for his withdrawal.  He is discharged home in improved condition verbalizes understanding and agreement with the plan with a short  course of chlordiazepoxide to self taper.      ____________________________________________   FINAL CLINICAL IMPRESSION(S) / ED DIAGNOSES  Final diagnoses:  Alcohol withdrawal syndrome with complication ([place])  Alcohol abuse      NEW MEDICATIONS STARTED DURING THIS VISIT:  Discharge Medication List as of 04/01/2018  1:17 AM       Note:  This document was prepared using Dragon voice recognition software and may include unintentional dictation errors.     Darel Hong, MD 04/03/18 [phone]

## 2018-04-01 NOTE — BH Assessment (Addendum)
Assessment Note  Miguel [name] is an 57 y.o. male brought into ED by [facility] due to falling while at home. Pt reports to using alcohol daily and contributes use to anxiety he feels he has. Pt reports to being stressed due to being primary caregiver of his disabled wife and having financial issues. Pt denies SI, HI, AH, and VH. Pt calm, cooperative, and logical at time of assessment. Pt provided with resources for outpatient treatment/ detox.   Diagnosis: Alcohol use disorder, severe  Past Medical History:  Past Medical History:  Diagnosis Date  . Alcohol abuse   . Asthma   . GERD (gastroesophageal reflux disease)   . Gout   . Hypertension   . OCD (obsessive compulsive disorder)   . Renal colic     Past Surgical History:  Procedure Laterality Date  . CHOLECYSTECTOMY  2012    Family History:  Family History  Problem Relation Age of Onset  . Alcohol abuse Father     Social History:  reports that he has quit smoking. He has never used smokeless tobacco. He reports that he drinks about 4.2 oz of alcohol per week. He reports that he does not use drugs.  Additional Social History:  Alcohol / Drug Use Pain Medications: see mar  Prescriptions: see mar Over the Counter: see mar History of alcohol / drug use?: Yes Negative Consequences of Use: Financial, Personal relationships, Work / Youth worker Withdrawal Symptoms: Tingling, Irritability, Fever / Chills Substance #1 Name of Substance 1: alcohol 1 - Age of First Use: 19 1 - Amount (size/oz): varies 1 - Frequency: daily 1 - Duration: varies 1 - Last Use / Amount: yesterday  CIWA: CIWA-Ar BP: (!) 160/100 Pulse Rate: 88 Nausea and Vomiting: no nausea and no vomiting Tactile Disturbances: none Tremor: two Auditory Disturbances: not present Paroxysmal Sweats: barely perceptible sweating, palms moist Visual Disturbances: not present Anxiety: no anxiety, at ease Headache, Fullness in Head: very mild Agitation: somewhat more than  normal activity Orientation and Clouding of Sensorium: cannot do serial additions or is uncertain about date CIWA-Ar Total: 6 COWS:    Allergies: No Known Allergies  Home Medications:  (Not in a hospital admission)  OB/GYN Status:  No LMP for male patient.  General Assessment Data Location of Assessment: [facility] ED TTS Assessment: In system Is this a Tele or Face-to-Face Assessment?: Face-to-Face Is this an Initial Assessment or a Re-assessment for this encounter?: Initial Assessment Marital status: Married Is patient pregnant?: No Pregnancy Status: No Living Arrangements: Spouse/significant other Can pt return to current living arrangement?: Yes Admission Status: Voluntary Is patient capable of signing voluntary admission?: Yes Referral Source: Self/Family/Friend Insurance type: none     Crisis Care Plan Living Arrangements: Spouse/significant other Legal Guardian: Other:(self) Name of Psychiatrist: None(None) Name of Therapist: None  Education Status Is patient currently in school?: No Is the patient employed, unemployed or receiving disability?: Unemployed  Risk to self with the past 6 months Suicidal Ideation: No Has patient been a risk to self within the past 6 months prior to admission? : Yes Suicidal Intent: No Has patient had any suicidal intent within the past 6 months prior to admission? : No Is patient at risk for suicide?: No Suicidal Plan?: No Has patient had any suicidal plan within the past 6 months prior to admission? : No Access to Means: Yes Specify Access to Suicidal Means: knives What has been your use of drugs/alcohol within the last 12 months?: Pt drinks alcohol daily Previous Attempts/Gestures: Yes  How many times?: (Pt denis any attempts) Other Self Harm Risks: pt hx of cutting himself Triggers for Past Attempts: Unpredictable, Other (Comment)(Stress, notes he is primary caregiver of wife) Intentional Self Injurious Behavior: Cutting Comment  - Self Injurious Behavior: pt hx of cutting arms,hands, legs Family Suicide History: No Recent stressful life event(s): Financial Problems, Other (Comment) Persecutory voices/beliefs?: No Depression: Yes Depression Symptoms: Insomnia, Tearfulness, Isolating, Guilt, Feeling worthless/self pity Substance abuse history and/or treatment for substance abuse?: Yes Suicide prevention information given to non-admitted patients: Not applicable  Risk to Others within the past 6 months Homicidal Ideation: No Does patient have any lifetime risk of violence toward others beyond the six months prior to admission? : No Thoughts of Harm to Others: No Current Homicidal Intent: No Current Homicidal Plan: No Access to Homicidal Means: No Identified Victim: (None identified) History of harm to others?: No Assessment of Violence: None Noted Violent Behavior Description: Pt denies Does patient have access to weapons?: Yes (Comment)(knives) Criminal Charges Pending?: No Does patient have a court date: No Is patient on probation?: No  Psychosis Hallucinations: None noted Delusions: None noted  Mental Status Report Appearance/Hygiene: Body odor, Disheveled Eye Contact: Good Motor Activity: Freedom of movement Speech: Logical/coherent Level of Consciousness: Alert Mood: Depressed, Anxious Affect: Appropriate to circumstance Anxiety Level: Minimal Thought Processes: Coherent, Relevant Judgement: Partial Orientation: Person, Place, Time, Situation, Appropriate for developmental age Obsessive Compulsive Thoughts/Behaviors: Minimal  Cognitive Functioning Concentration: Normal Memory: Recent Intact, Remote Intact Is patient IDD: No Is patient DD?: No Insight: Fair Impulse Control: Poor Appetite: Fair Have you had any weight changes? : No Change Sleep: Decreased Total Hours of Sleep: 3 Vegetative Symptoms: Staying in bed  ADLScreening [facility] Assessment Services) Patient's cognitive ability  adequate to safely complete daily activities?: Yes Patient able to express need for assistance with ADLs?: Yes Independently performs ADLs?: Yes (appropriate for developmental age)  Prior Inpatient Therapy Prior Inpatient Therapy: No  Prior Outpatient Therapy Prior Outpatient Therapy: No Does patient have an ACCT team?: No Does patient have Intensive In-House Services?  : No Does patient have [facility] services? : No Does patient have P4CC services?: No  ADL Screening (condition at time of admission) Patient's cognitive ability adequate to safely complete daily activities?: Yes Is the patient deaf or have difficulty hearing?: No Does the patient have difficulty seeing, even when wearing glasses/contacts?: No Does the patient have difficulty concentrating, remembering, or making decisions?: No Patient able to express need for assistance with ADLs?: Yes Does the patient have difficulty dressing or bathing?: No Independently performs ADLs?: Yes (appropriate for developmental age) Does the patient have difficulty walking or climbing stairs?: No Weakness of Legs: None Weakness of Arms/Hands: None  Home Assistive Devices/Equipment Home Assistive Devices/Equipment: None  Therapy Consults (therapy consults require a physician order) PT Evaluation Needed: No OT Evalulation Needed: No SLP Evaluation Needed: No Abuse/Neglect Assessment (Assessment to be complete while patient is alone) Abuse/Neglect Assessment Can Be Completed: Yes Physical Abuse: Denies Verbal Abuse: Denies Sexual Abuse: Denies Exploitation of patient/patient's resources: Denies Self-Neglect: Denies Values / Beliefs Cultural Requests During Hospitalization: None Spiritual Requests During Hospitalization: None Consults Spiritual Care Consult Needed: No Social Work Consult Needed: No      Additional Information 1:1 In Past 12 Months?: No CIRT Risk: No Elopement Risk: No Does patient have medical clearance?:  Yes  Child/Adolescent Assessment Running Away Risk: Denies Bed-Wetting: Denies Destruction of Property: Denies Cruelty to Animals: Denies Stealing: Denies Rebellious/Defies Authority: Denies Satanic Involvement: Denies Estate agent  Setting: Denies Problems at School: Denies Gang Involvement: Denies  Disposition:  Disposition Initial Assessment Completed for this Encounter: Yes Disposition of Patient: (Pending MD recommendation) Patient refused recommended treatment: No Mode of transportation if patient is discharged?: Car Patient referred to: (Pending MD recommendation, provided SA resources)  On Site Evaluation by:   Reviewed with Physician:    Ethelene Browns, [place], [facility] 04/01/2018 2:47 AM

## 2018-04-01 NOTE — Discharge Instructions (Addendum)
AA is very helpful for many people, but not for everyone.  SMART Recovery is another great option.  Go to www.smartrecovery.org for online resources.  It was a pleasure to take care of you today, and thank you for coming to our emergency department.  If you have any questions or concerns before leaving please ask the nurse to grab me and I'm more than happy to go through your aftercare instructions again.  If you were prescribed any opioid pain medication today such as Norco, Vicodin, Percocet, morphine, hydrocodone, or oxycodone please make sure you do not drive when you are taking this medication as it can alter your ability to drive safely.  If you have any concerns once you are home that you are not improving or are in fact getting worse before you can make it to your follow-up appointment, please do not hesitate to call 911 and come back for further evaluation.  Darel Hong, MD  Results for orders placed or performed during the hospital encounter of 03/10/18  Comprehensive metabolic panel  Result Value Ref Range   Sodium 141 135 - 145 mmol/L   Potassium 3.3 (L) 3.5 - 5.1 mmol/L   Chloride 111 101 - 111 mmol/L   CO2 21 (L) 22 - 32 mmol/L   Glucose, Bld 114 (H) 65 - 99 mg/dL   BUN 7 6 - 20 mg/dL   Creatinine, Ser 0.97 0.61 - 1.24 mg/dL   Calcium 8.9 8.9 - 10.3 mg/dL   Total Protein 7.5 6.5 - 8.1 g/dL   Albumin 4.3 3.5 - 5.0 g/dL   AST 14 (L) 15 - 41 U/L   ALT 12 (L) 17 - 63 U/L   Alkaline Phosphatase 71 38 - 126 U/L   Total Bilirubin 0.6 0.3 - 1.2 mg/dL   GFR calc non Af Amer >60 >60 mL/min   GFR calc Af Amer >60 >60 mL/min   Anion gap 9 5 - 15  Ethanol  Result Value Ref Range   Alcohol, Ethyl (B) 246 (H) <23 mg/dL  Salicylate level  Result Value Ref Range   Salicylate Lvl <5.3 2.8 - 30.0 mg/dL  Acetaminophen level  Result Value Ref Range   Acetaminophen (Tylenol), Serum <10 (L) 10 - 30 ug/mL  cbc  Result Value Ref Range   WBC 5.5 3.8 - 10.6 K/uL   RBC 4.80 4.40 - 5.90  MIL/uL   Hemoglobin 11.3 (L) 13.0 - 18.0 g/dL   HCT 35.0 (L) 40.0 - 52.0 %   MCV 72.9 (L) 80.0 - 100.0 fL   MCH 23.4 (L) 26.0 - 34.0 pg   MCHC 32.1 32.0 - 36.0 g/dL   RDW 21.0 (H) 11.5 - 14.5 %   Platelets 233 150 - 440 K/uL  Urine Drug Screen, Qualitative  Result Value Ref Range   Tricyclic, Ur Screen NONE DETECTED NONE DETECTED   Amphetamines, Ur Screen NONE DETECTED NONE DETECTED   MDMA (Ecstasy)Ur Screen NONE DETECTED NONE DETECTED   Cocaine Metabolite,Ur [place] NONE DETECTED NONE DETECTED   Opiate, Ur Screen NONE DETECTED NONE DETECTED   Phencyclidine (PCP) Ur S NONE DETECTED NONE DETECTED   Cannabinoid 50 Ng, Ur [place] POSITIVE (A) NONE DETECTED   Barbiturates, Ur Screen NONE DETECTED NONE DETECTED   Benzodiazepine, Ur Scrn POSITIVE (A) NONE DETECTED   Methadone Scn, Ur NONE DETECTED NONE DETECTED   Dg Chest 2 View  Result Date: 03/08/2018 CLINICAL DATA:  Shortness of breath EXAM: CHEST - 2 VIEW COMPARISON:  10/03/2017 FINDINGS: Atelectasis at  the lingula. No acute consolidation or effusion. Normal heart size. No pneumothorax. Mild degenerative changes of the spine. IMPRESSION: No active cardiopulmonary disease.  Mild atelectasis at the lingula. Electronically Signed   By: Donavan Foil M.D.   On: 03/08/2018 20:48   Ct Abdomen Pelvis W Contrast  Result Date: 03/02/2018 CLINICAL DATA:  Acute onset of lower back pain and increased urinary urgency. EXAM: CT ABDOMEN AND PELVIS WITH CONTRAST TECHNIQUE: Multidetector CT imaging of the abdomen and pelvis was performed using the standard protocol following bolus administration of intravenous contrast. CONTRAST:  189mL ISOVUE-300 IOPAMIDOL (ISOVUE-300) INJECTION 61% COMPARISON:  CT of the abdomen and pelvis performed 11/13/2017 FINDINGS: Lower chest: The visualized lung bases are grossly clear. The visualized portions of the mediastinum are unremarkable. Hepatobiliary: The liver is unremarkable in appearance. The patient is status post  cholecystectomy. The common bile duct remains normal in caliber. Pancreas: Several duodenal diverticula are noted at the pancreatic head, containing air and fluid. The pancreas is otherwise unremarkable. Spleen: The spleen is unremarkable in appearance. Adrenals/Urinary Tract: The adrenal glands are unremarkable in appearance. Nonobstructing bilateral renal stones are seen, measuring up to 1.3 cm on the left. Mild nonspecific perinephric stranding is noted bilaterally. There is no evidence of hydronephrosis. No obstructing ureteral stones are seen. Stomach/Bowel: The stomach is unremarkable in appearance. The small bowel is within normal limits. The appendix is normal in caliber, without evidence of appendicitis. Diffuse fatty infiltration of the wall of the ascending colon raises concern for sequelae of chronic inflammation. Scattered diverticulosis is noted along the proximal sigmoid colon, without evidence of diverticulitis. Vascular/Lymphatic: The abdominal aorta is unremarkable in appearance. The inferior vena cava is grossly unremarkable. No retroperitoneal lymphadenopathy is seen. No pelvic sidewall lymphadenopathy is identified. Reproductive: The bladder is mildly distended and grossly unremarkable. The prostate is borderline prominent, measuring 4.8 cm in transverse dimension. Other: No additional soft tissue abnormalities are seen. Musculoskeletal: No acute osseous abnormalities are identified. The visualized musculature is unremarkable in appearance. IMPRESSION: 1. No acute abnormality seen to explain the patient's symptoms. 2. Diffuse fatty infiltration of the wall of the ascending colon raises concern for sequelae of chronic inflammation. Scattered diverticulosis along the proximal sigmoid colon, without evidence of diverticulitis. 3. Nonobstructing bilateral renal stones, measuring up to 1.3 cm on the left. 4. 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 Pancreas unremarkable in appearance.  Electronically Signed   By: Garald Balding M.D.   On: 03/02/2018 06:21   Unfortunately prescriptions medications can be very expensive.  Please consider [facility] as they have a number of medications that are $4 for a 30 day supply.  [email or web address]  Another great option is www.goodrx.com which can help you find the most affordable prices around you.

## 2018-05-05 ENCOUNTER — Telehealth: Payer: Self-pay | Admitting: Pharmacist

## 2018-05-05 NOTE — Telephone Encounter (Signed)
05/05/2018 9:13:00 AM - Patient in office for meds  05/05/18 Patient in office 05/04/18 for meds-Patient has not gotten meds from [name] since Dec. 2018. I printed applications for meds listed below, got patient to sign his portion, will send to [facility] for provider to sign. Explained to patient if he has not been seen in a while they may not sign until he has appointment with them, advised patient to call and get appt. set up. Meds: Ventolin HFA 90 mcg Inhale 2 puffs every four hours as needed, Advair 100/50 mcg Inhale 1 puff two times a day, rinse mouth & spit after each use, Colcrys 0.6mg  Take one tablet by mouth 1-2 times a day with food, Dexilant 60mg  Take 1 capsule by mouth once daily.

## 2018-05-10 IMAGING — CT CT RENAL STONE PROTOCOL
2 of 4 series · 15 of 46 positions shown, 17 images · non-contrast
Comparison: Prior CT from 11/20/2016.

CLINICAL DATA: Initial evaluation for acute left lower abdominal
pain, nausea, vomiting, diarrhea.

EXAM:
CT ABDOMEN AND PELVIS WITHOUT CONTRAST
TECHNIQUE: Multidetector CT imaging of the abdomen and pelvis was performed
following the standard protocol without IV contrast.

[Series 2: stone full standard · axial · 0.81mm/px · z∈[-820,-405]mm · 12 of 95 slices shown, 14 images]
[im 8/95  soft-tissue]
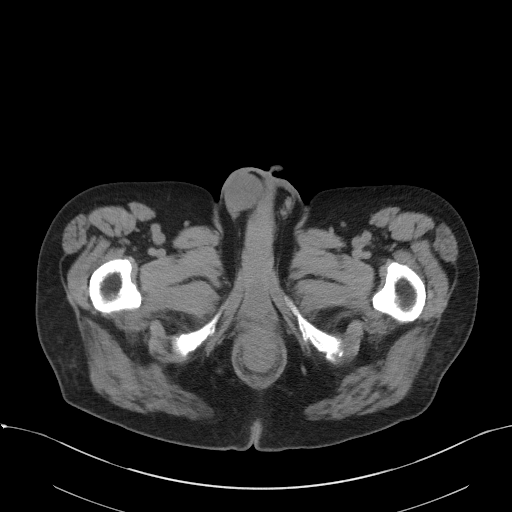
[im 8/95  bone]
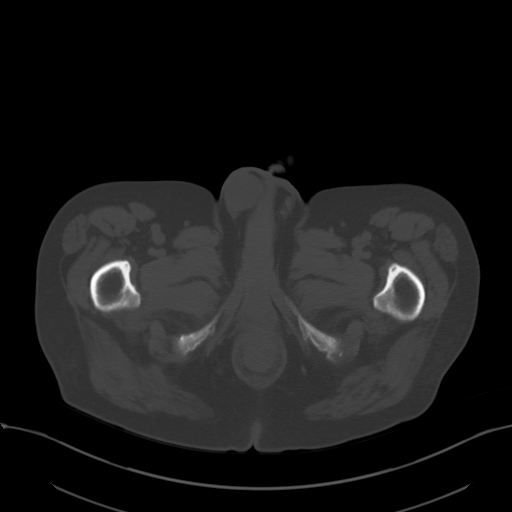
[im 16/95  soft-tissue]
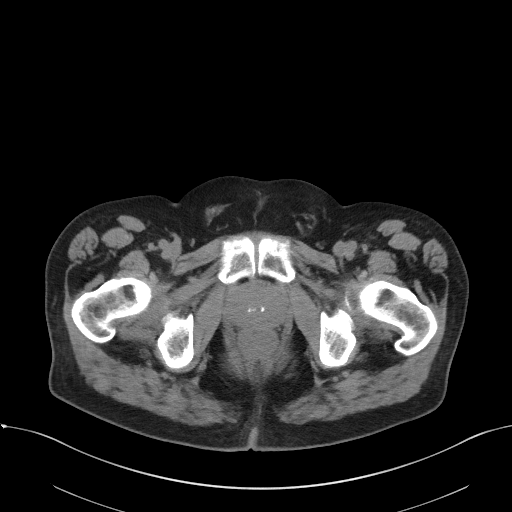
[im 23/95  soft-tissue]
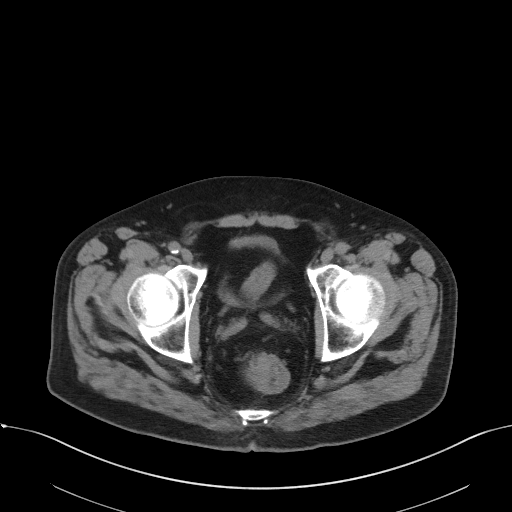
[im 31/95  soft-tissue]
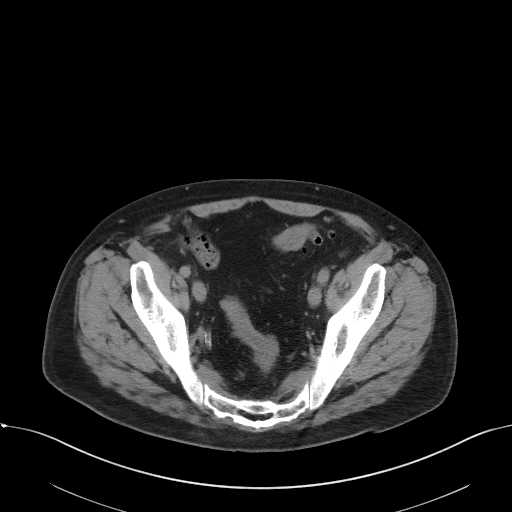
[im 38/95  soft-tissue]
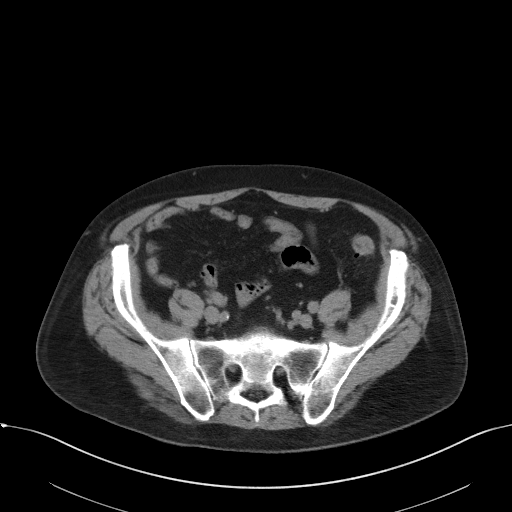
[im 46/95  soft-tissue]
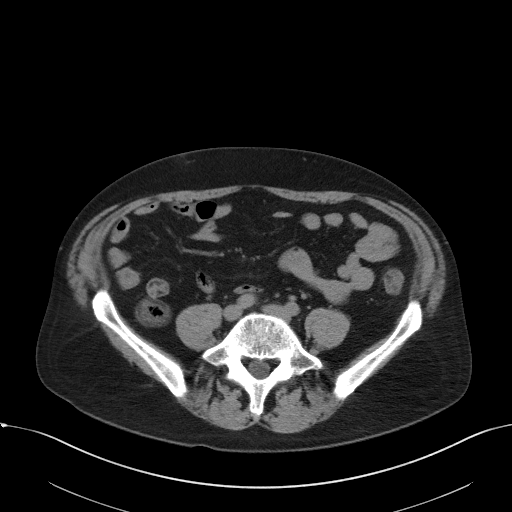
[im 53/95  soft-tissue]
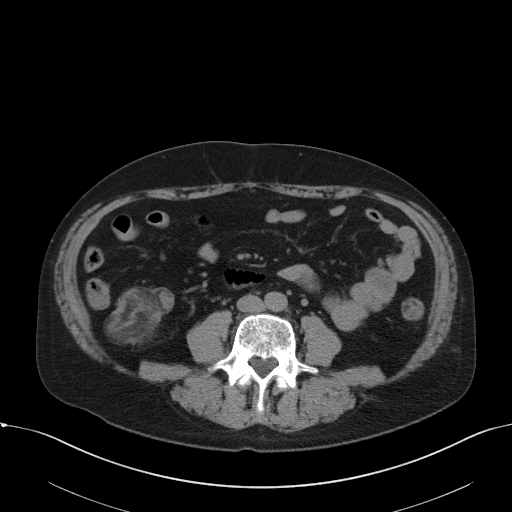
[im 61/95  soft-tissue]
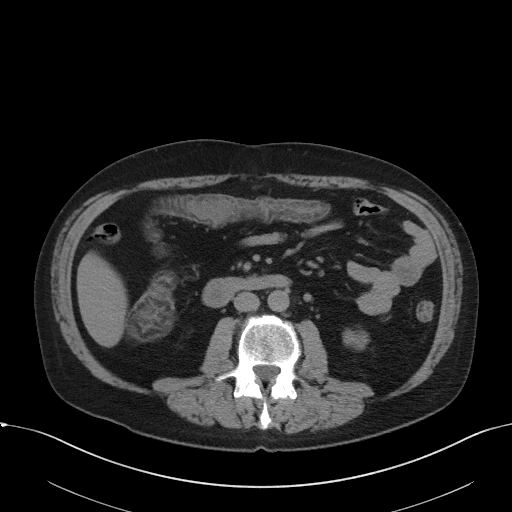
[im 68/95  soft-tissue]
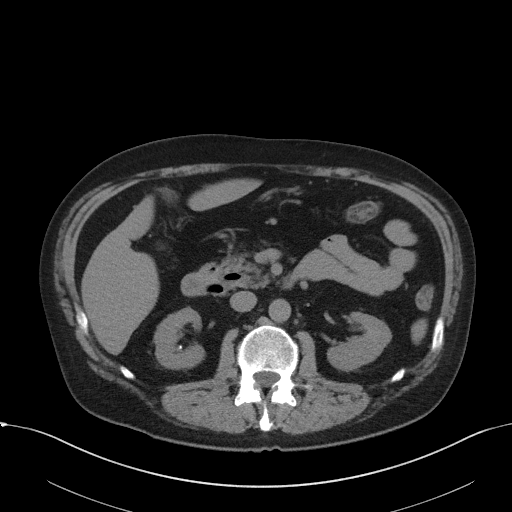
[im 68/95  bone]
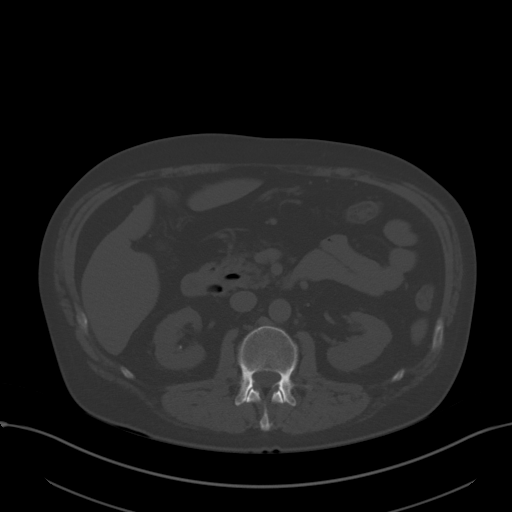
[im 76/95  soft-tissue]
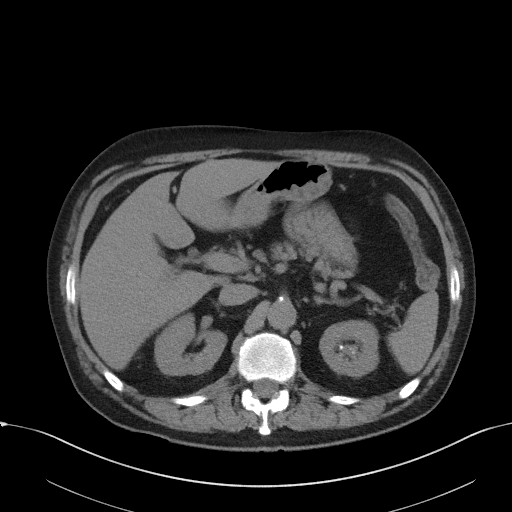
[im 83/95  soft-tissue]
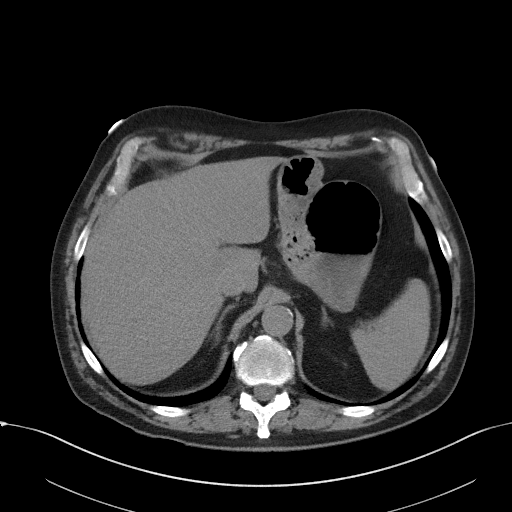
[im 91/95  soft-tissue]
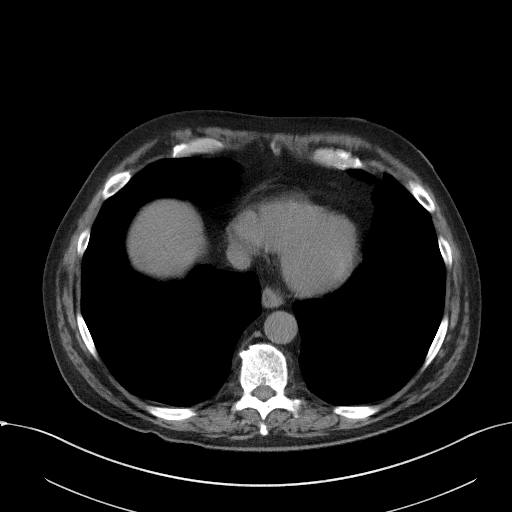

[Series 5: coronal · coronal · 0.75mm/px · 3 of 129 slices shown]
[im 43/129  soft-tissue]
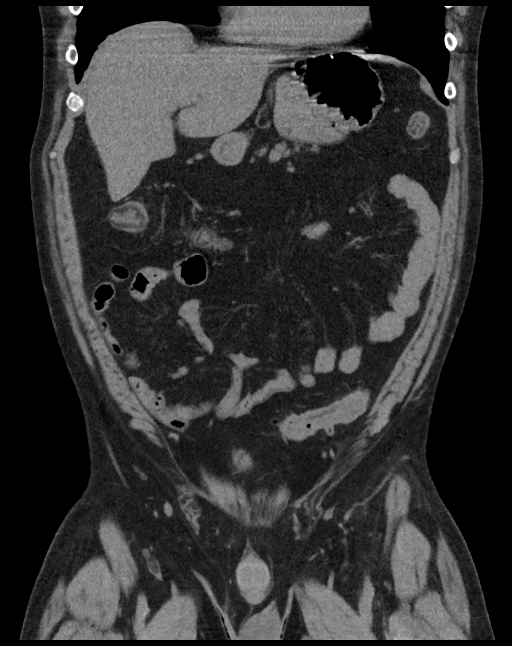
[im 57/129  soft-tissue]
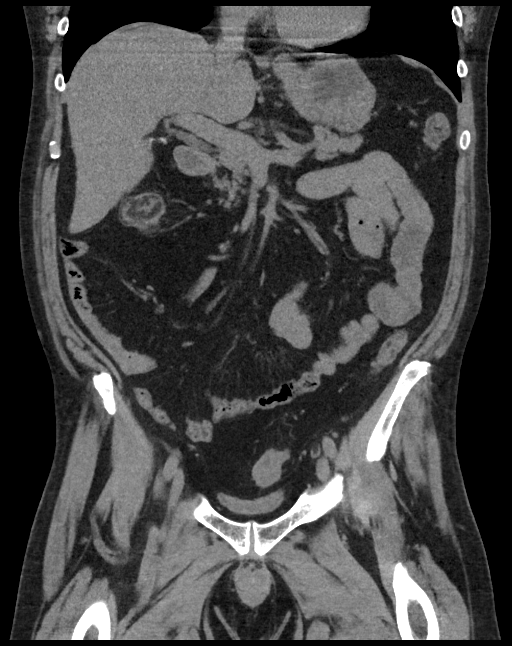
[im 72/129  soft-tissue]
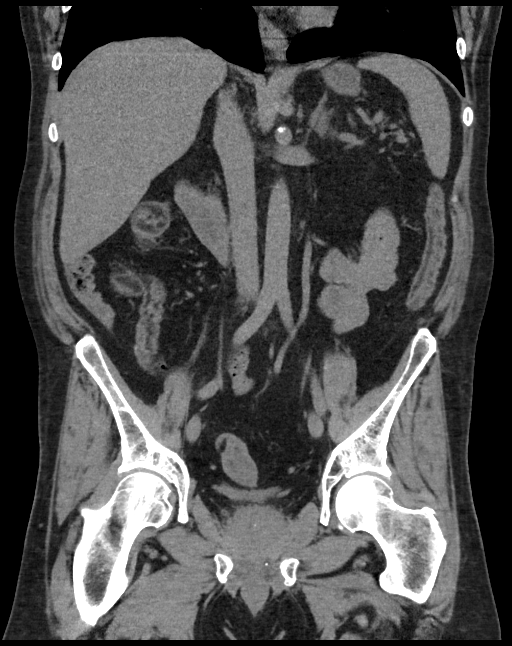

[15 of 46 positions shown; findings below may reference images not displayed]

FINDINGS: Lower chest: Visualized lung bases are clear.

Hepatobiliary: Liver demonstrates a normal unenhanced appearance.
Gallbladder absent. No biliary dilatation.

Pancreas: Pancreas within normal limits.

Spleen: Spleen within normal limits.

Adrenals/Urinary Tract: Adrenal glands are normal.

Kidneys equal in size. Bilateral nonobstructive nephrolithiasis.
Largest on the right positioned in the upper pole and measures 4 mm.
Largest on the left also position within the upper pole and measures
10 mm. No radiopaque stones seen along the course of the renal
collecting systems bilaterally. No hydronephrosis or hydroureter.
Bladder largely decompressed without abnormality. No layering stones
within the bladder lumen.

Stomach/Bowel: Small hiatal hernia. Stomach otherwise unremarkable.
No evidence for bowel obstruction. Appendix normal. Sigmoid
diverticulosis without evidence for diverticulitis. Ascending
transverse, and descending colon are decompressed. Circumferential
wall thickening likely related incomplete distension, although a
superimposed acute colitis could also be considered. No other acute
inflammatory changes about the bowels.

Vascular/Lymphatic: Mild aortic atherosclerosis. No aneurysm. No
adenopathy.

Reproductive: Prostate within normal limits.

Other: No free air or fluid. Fat containing ventral hernia noted
without associated inflammation.

Musculoskeletal: No acute osseus abnormality. No worrisome lytic or
blastic osseous lesions.
IMPRESSION: 1. Bilateral nonobstructive nephrolithiasis as above. No CT evidence
for obstructive uropathy.
2. Diffuse wall thickening about the ascending, transverse, and
descending colon, favored to be related to incomplete distension,
although an acute colitis could also be considered in the correct
clinical setting.
3. Sigmoid diverticulosis without evidence for acute diverticulitis.
4. Small hiatal hernia.

## 2018-05-10 IMAGING — CR DG CHEST 2V
1 series · 2 of 2 positions shown · non-contrast
Comparison: 02/28/2017

CLINICAL DATA: Left-sided chest pain

EXAM:
CHEST  2 VIEW

[Series 1: w chest pa · 0.14mm/px · 2 of 2 slices shown]
[im 1/2]
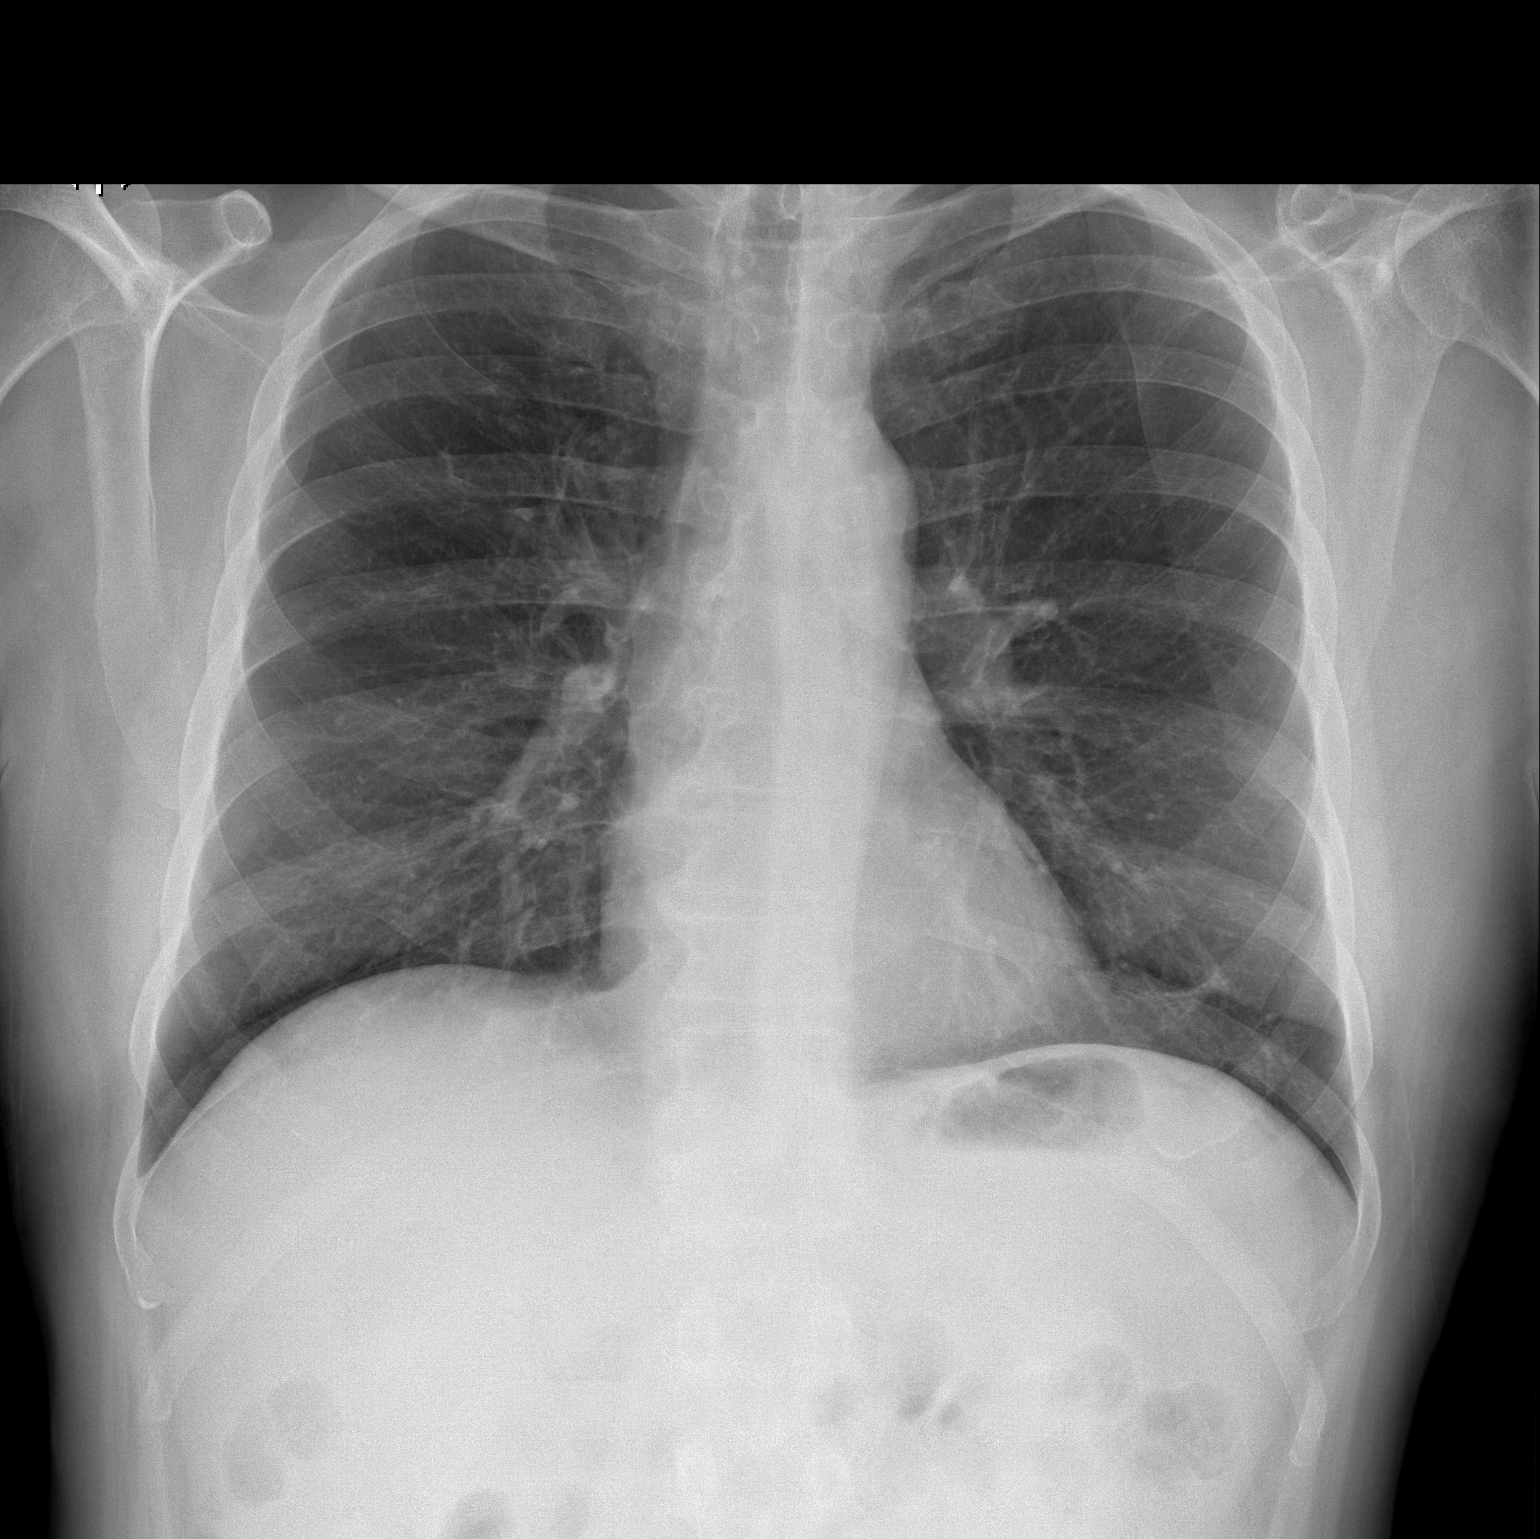
[im 2/2]
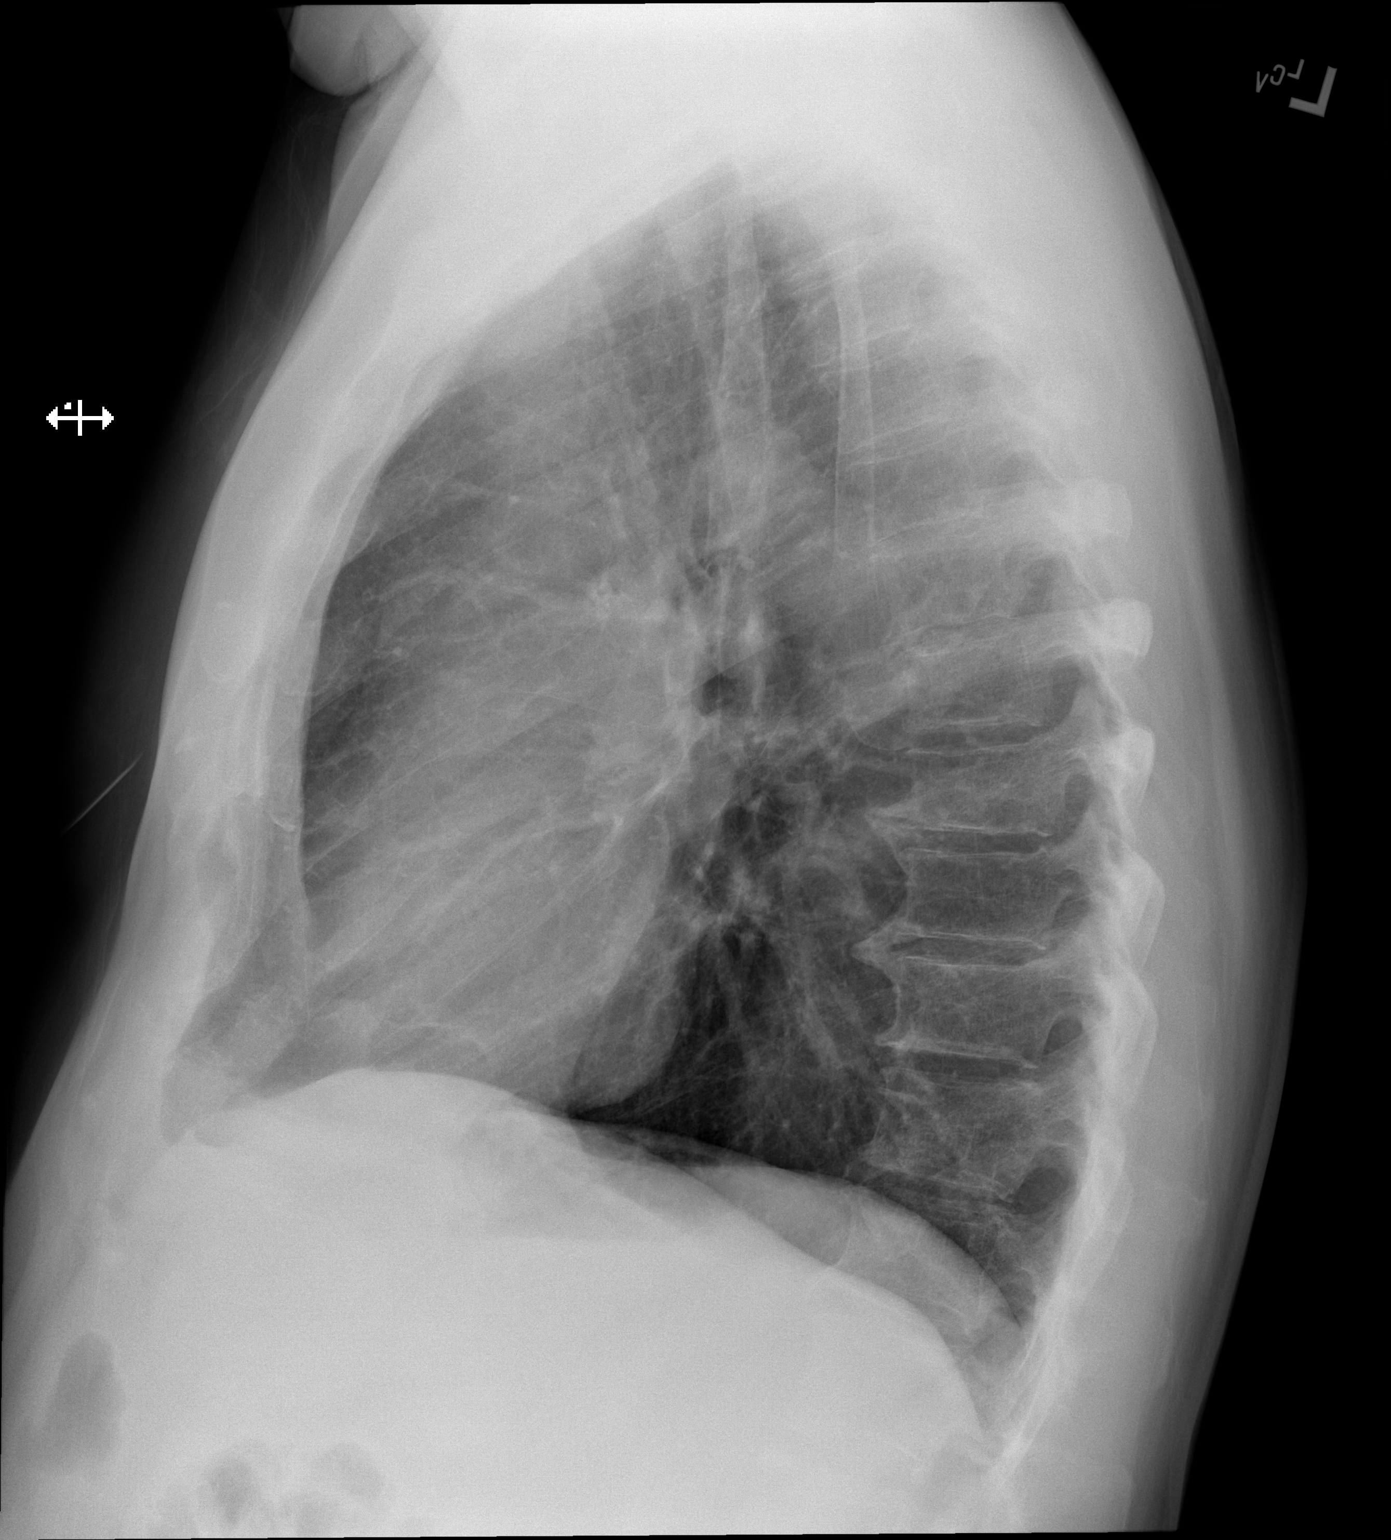

[2 of 2 positions shown; findings below may reference images not displayed]

FINDINGS: The heart size and mediastinal contours are within normal limits.
Both lungs are clear. Degenerative changes of the spine.
IMPRESSION: No active cardiopulmonary disease.

## 2018-05-12 ENCOUNTER — Ambulatory Visit: Payer: Self-pay | Admitting: Adult Health

## 2018-05-12 ENCOUNTER — Encounter: Payer: Self-pay | Admitting: Adult Health

## 2018-05-12 VITALS — BP 154/91 | Temp 98.2°F | Ht 70.0 in | Wt 192.9 lb

## 2018-05-12 DIAGNOSIS — F1994 Other psychoactive substance use, unspecified with psychoactive substance-induced mood disorder: Secondary | ICD-10-CM

## 2018-05-12 DIAGNOSIS — F10982 Alcohol use, unspecified with alcohol-induced sleep disorder: Secondary | ICD-10-CM

## 2018-05-12 DIAGNOSIS — K2100 Gastro-esophageal reflux disease with esophagitis, without bleeding: Secondary | ICD-10-CM

## 2018-05-12 DIAGNOSIS — I10 Essential (primary) hypertension: Secondary | ICD-10-CM

## 2018-05-12 DIAGNOSIS — M109 Gout, unspecified: Secondary | ICD-10-CM

## 2018-05-12 DIAGNOSIS — F332 Major depressive disorder, recurrent severe without psychotic features: Secondary | ICD-10-CM

## 2018-05-12 DIAGNOSIS — F1020 Alcohol dependence, uncomplicated: Secondary | ICD-10-CM

## 2018-05-12 MED ORDER — PANTOPRAZOLE SODIUM 40 MG PO TBEC: 40.0000 mg | DELAYED_RELEASE_TABLET | Freq: Two times a day (BID) | ORAL | 3 refills | Status: DC

## 2018-05-12 MED ORDER — DIPHENHYDRAMINE HCL 50 MG PO TABS: 50.0000 mg | ORAL_TABLET | Freq: Every evening | ORAL | 1 refills | Status: DC | PRN

## 2018-05-12 MED ORDER — MULTI-VITAMIN/MINERALS PO TABS: 1.0000 | ORAL_TABLET | Freq: Every day | ORAL | 3 refills | Status: DC

## 2018-05-12 MED ORDER — FLUTICASONE PROPIONATE 50 MCG/ACT NA SUSP: 1.0000 | Freq: Two times a day (BID) | NASAL | 2 refills | Status: DC

## 2018-05-12 MED ORDER — VITAMIN B-1 100 MG PO TABS: 100.0000 mg | ORAL_TABLET | Freq: Every day | ORAL | 3 refills | Status: DC

## 2018-05-12 MED ORDER — FOLIC ACID 1 MG PO TABS: 1.0000 mg | ORAL_TABLET | Freq: Every day | ORAL | 3 refills | Status: DC

## 2018-05-12 MED ORDER — COLCHICINE 0.6 MG PO TABS
0.6000 mg | ORAL_TABLET | Freq: Every day | ORAL | Status: DC
Start: 2018-05-12 — End: 2018-07-07

## 2018-05-12 MED ORDER — FLUTICASONE-SALMETEROL 100-50 MCG/DOSE IN AEPB: 1.0000 | INHALATION_SPRAY | Freq: Two times a day (BID) | RESPIRATORY_TRACT | 0 refills | Status: DC

## 2018-05-12 MED ORDER — CLONIDINE HCL 0.1 MG PO TABS: 0.1000 mg | ORAL_TABLET | Freq: Two times a day (BID) | ORAL | 11 refills | Status: DC

## 2018-05-12 NOTE — Progress Notes (Signed)
Patient: Miguel [name] Male    DOB: Apr 29, 1961   57 y.o.   MRN: [id number] Visit Date: 05/12/2018  Today's Provider: Deforest Hoyles, NP   Chief Complaint  Patient presents with  . Diarrhea   Subjective:    HPI  Patient presents for f/u hypertension, alcohol abuse and medication refills.  Insomnia: Patient is c/o of insomnia. States that he has a hard time fall asleep and staying asleep.   ETOH basue: continues to drink.Last drink 3 days ago. States that he feels shaky Elevated blood pressure: His blood pressure is elevated.  He is not currently on any scheduled blood pressure medications Gout: He is on allopurinol as well as colchicine but states that he has not been taking the allopurinol.  He took 1 dose of gout when he felt he had a flareup recently and it resolved. GERD: He is currently on Dexilant 60 mg daily; he was on omeprazole as well but that was discontinued.  Reports persistent heartburn He has had multiple ER visits for alcohol-related problems.  His last visit was on Apr 01, 2018 during which she was seen for alcohol withdrawal symptoms.  No Known Allergies Previous Medications   ADVAIR DISKUS 100-50 MCG/DOSE AEPB    INHALE 1 PUFF 2 TIMES A DAY. RINSE MOUTH AND SPIT AFTER EACH USE.   ALLOPURINOL (ZYLOPRIM) 300 MG TABLET    Take 300 mg by mouth daily.   CHLORDIAZEPOXIDE (LIBRIUM) 25 MG CAPSULE    Take 1 capsule (25 mg total) by mouth 3 (three) times daily as needed for withdrawal.   DEXLANSOPRAZOLE (DEXILANT) 60 MG CAPSULE    Take 60 mg by mouth every morning.   FERROUS SULFATE 325 (65 FE) MG TABLET    Take 1 tablet (325 mg total) by mouth 2 (two) times daily with a meal.   HYDROCODONE-ACETAMINOPHEN (NORCO) 5-325 MG TABLET    Take 1 tablet by mouth every 6 (six) hours as needed for up to 7 doses for severe pain.   LIDOCAINE (LIDODERM) 5 %    Place 1 patch onto the skin every 12 (twelve) hours. Remove & Discard patch within 12 hours or as directed by MD   LISINOPRIL  (PRINIVIL,ZESTRIL) 20 MG TABLET    Take 1 tablet (20 mg total) by mouth every morning.   LORAZEPAM (ATIVAN) 1 MG TABLET    Take 1 tablet (1 mg total) by mouth every 8 (eight) hours as needed for anxiety.   OMEPRAZOLE (PRILOSEC) 40 MG CAPSULE    Take 1 capsule (40 mg total) by mouth daily.   TAMSULOSIN (FLOMAX) 0.4 MG CAPS CAPSULE    Take 1 capsule (0.4 mg total) by mouth daily.   TRAZODONE (DESYREL) 50 MG TABLET    Take 1 tablet (50 mg total) by mouth at bedtime as needed for sleep.   VENTOLIN HFA 108 (90 BASE) MCG/ACT INHALER    INHALE 2 PUFFS EVERY 4 HOURS AS NEEDED    Review of Systems  Constitutional: Positive for appetite change (poor) and fatigue.  HENT: Negative.   Eyes: Negative.   Respiratory: Negative.   Cardiovascular: Negative for chest pain, palpitations and leg swelling.  Gastrointestinal: Negative.   Endocrine: Negative.   Genitourinary: Negative.   Musculoskeletal: Negative.   Skin: Negative.   Neurological: Positive for dizziness and tremors. Negative for syncope.  Hematological: Negative.   Psychiatric/Behavioral: Negative.     Social History   Tobacco Use  . Smoking status: Former Research scientist (life sciences)  . Smokeless tobacco: Never Used  .  Tobacco comment: quit 30 years ago  Substance Use Topics  . Alcohol use: Yes    Alcohol/week: 4.2 oz    Types: 7 Glasses of wine per week    Comment: Pt states, "I quit all the hard stuff 4 months ago"   Objective:   BP (!) 154/91   Temp 98.2 F (36.8 C) (Oral)   Ht 5\' 10"  (1.778 m)   Wt 192 lb 14.4 oz (87.5 kg)   BMI 27.68 kg/m   Physical Exam  Constitutional: He is oriented to person, place, and time. He appears well-developed and well-nourished.  Appears chronically ill  HENT:  Head: Normocephalic and atraumatic.  Mouth/Throat: Oropharynx is clear and moist.  Eyes: Pupils are equal, round, and reactive to light. Conjunctivae and EOM are normal.  Neck: Normal range of motion. Neck supple.  Cardiovascular: Normal rate,  regular rhythm, normal heart sounds and intact distal pulses.  Pulmonary/Chest: Effort normal and breath sounds normal.  Abdominal: Soft. Bowel sounds are normal.  Musculoskeletal: Normal range of motion. He exhibits no edema or deformity.  Neurological: He is alert and oriented to person, place, and time.  Skin: Skin is warm and dry.  Psychiatric: He has a normal mood and affect.  Nursing note and vitals reviewed.  Assessment & Plan:  1. Essential hypertension Uncontrolled.  Will start patient on clonidine 0.1 mg twice a day patient has been advised to monitor heart his blood pressure and if his systolic blood pressure is less than 90 he should hold the clonidine.  He has also been advised to go to the emergency room if he monitors his blood pressure and his systolic blood pressures greater than 160 2. Severe recurrent major 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 ([place]) Exacerbated by alcohol abuse.  Patient was on trazodone, Librium and lorazepam.  His mood appears stable today.  We will consider starting him on a low-dose antidepressant if his mood deteriorates  3. Alcohol use disorder, severe, dependence ([place]) Patient continues to drink.  He has been advised to seek an alcohol support group such as Alcoholics Anonymous.  Unfortunately we do not have any community resources that I can link him to right now.  Will refer patient to a treatment program once once is identified that can accept him.  At this point he is not willing to do an inpatient treatment program Start thiamine folic acid and multivitamin he has murmur when you go to the dog is no red  4. Gout, unspecified cause, unspecified chronicity, unspecified site Continue allopurinol for prophylaxis  5. Substance induced mood disorder ([place]) Secondary to alcohol abuse.  See #2 above Patient has been advised to go to the emergency room if he feels that he is going through alcohol withdrawal   6.  Alcohol induced insomnia We will  start patient on Benadryl 50 mg orally at bedtime as needed   7. GERD-likely due to alcohol induced esophagitis.  Will start patient on Protonix 40 mg twice a day  Deforest Hoyles, NP   [facility]

## 2018-05-13 LAB — COMPREHENSIVE METABOLIC PANEL
ALT: 7 IU/L (ref 0–44)
AST: 7 IU/L (ref 0–40)
Albumin/Globulin Ratio: 1.8 (ref 1.2–2.2)
Albumin: 4.2 g/dL (ref 3.5–5.5)
Alkaline Phosphatase: 64 IU/L (ref 39–117)
BUN/Creatinine Ratio: 14 (ref 9–20)
BUN: 13 mg/dL (ref 6–24)
Bilirubin Total: 0.3 mg/dL (ref 0.0–1.2)
CO2: 24 mmol/L (ref 20–29)
Calcium: 9.5 mg/dL (ref 8.7–10.2)
Chloride: 103 mmol/L (ref 96–106)
Creatinine, Ser: 0.9 mg/dL (ref 0.76–1.27)
GFR calc Af Amer: 108 mL/min/{1.73_m2} (ref >59)
GFR calc non Af Amer: 94 mL/min/{1.73_m2} (ref >59)
Globulin, Total: 2.4 g/dL (ref 1.5–4.5)
Glucose: 116 mg/dL — ABNORMAL HIGH (ref 65–99)
Potassium: 3.7 mmol/L (ref 3.5–5.2)
Sodium: 145 mmol/L — ABNORMAL HIGH (ref 134–144)
Total Protein: 6.6 g/dL (ref 6.0–8.5)

## 2018-05-13 LAB — URIC ACID: Uric Acid: 11.7 mg/dL — ABNORMAL HIGH (ref 3.7–8.6)

## 2018-05-13 LAB — PHOSPHORUS: Phosphorus: 3.3 mg/dL (ref 2.5–4.5)

## 2018-05-13 LAB — MAGNESIUM: Magnesium: 1.5 mg/dL — ABNORMAL LOW (ref 1.6–2.3)

## 2018-05-14 ENCOUNTER — Other Ambulatory Visit: Payer: Self-pay | Admitting: Adult Health

## 2018-05-14 ENCOUNTER — Encounter: Payer: Self-pay | Admitting: Adult Health

## 2018-05-14 MED ORDER — ALLOPURINOL 300 MG PO TABS: 300.0000 mg | ORAL_TABLET | Freq: Every day | ORAL | 2 refills | Status: DC

## 2018-05-14 MED ORDER — MAGNESIUM OXIDE 400 (241.3 MG) MG PO TABS: 400.0000 mg | ORAL_TABLET | Freq: Every day | ORAL | 3 refills | Status: DC

## 2018-05-14 MED ORDER — CITALOPRAM HYDROBROMIDE 10 MG PO TABS: 10.0000 mg | ORAL_TABLET | Freq: Every day | ORAL | 2 refills | Status: DC

## 2018-05-14 NOTE — Progress Notes (Signed)
MAGNESIUM OXIDE and allopurinol ordered

## 2018-05-17 ENCOUNTER — Telehealth: Payer: Self-pay

## 2018-05-17 IMAGING — CT CT HEAD W/O CM
4 of 7 series · 14 of 47 positions shown, 15 images · non-contrast
Comparison: February 28, 2017.

CLINICAL DATA: Fall, head trauma.

EXAM:
CT HEAD WITHOUT CONTRAST
CT CERVICAL SPINE WITHOUT CONTRAST
TECHNIQUE: Multidetector CT imaging of the head and cervical spine was
performed following the standard protocol without intravenous
contrast. Multiplanar CT image reconstructions of the cervical spine
were also generated.

[Series 2: head wo · axial · 0.48mm/px · z∈[-122,-67]mm · 2 of 33 slices shown, 3 images]
[im 11/33  brain]
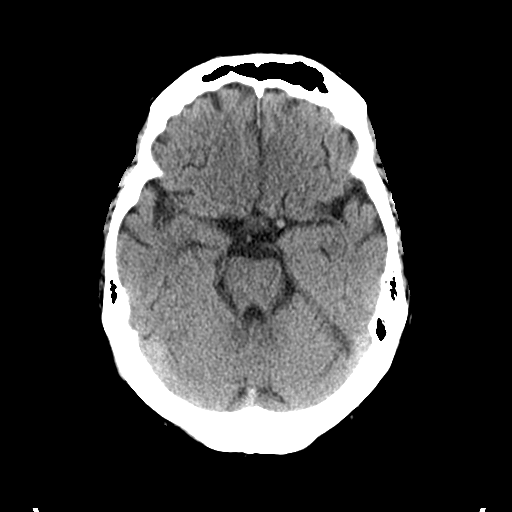
[im 11/33  bone]
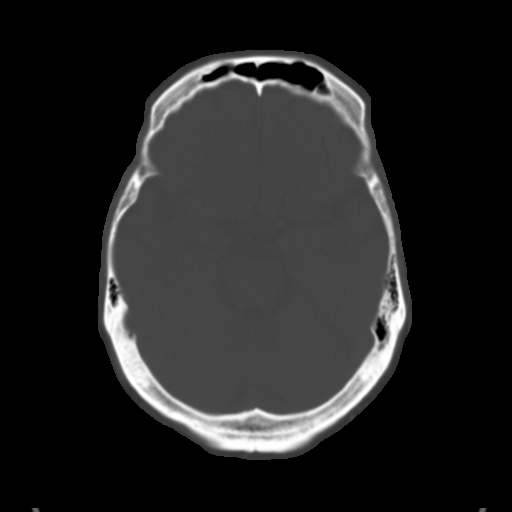
[im 22/33  brain]
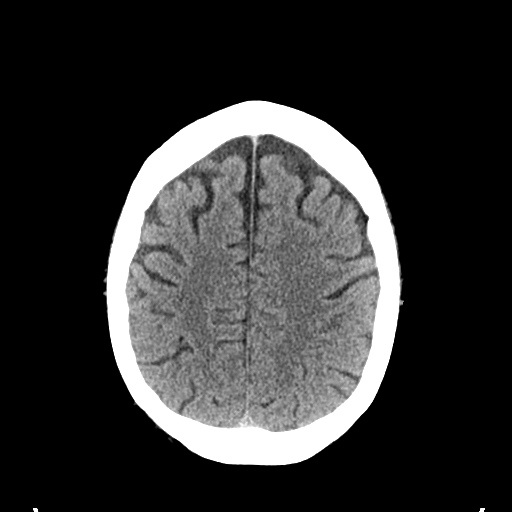

[Series 5: sagittal soft tissue · sagittal · 0.36mm/px · 1 of 60 slices shown]
[im 30/60  brain]
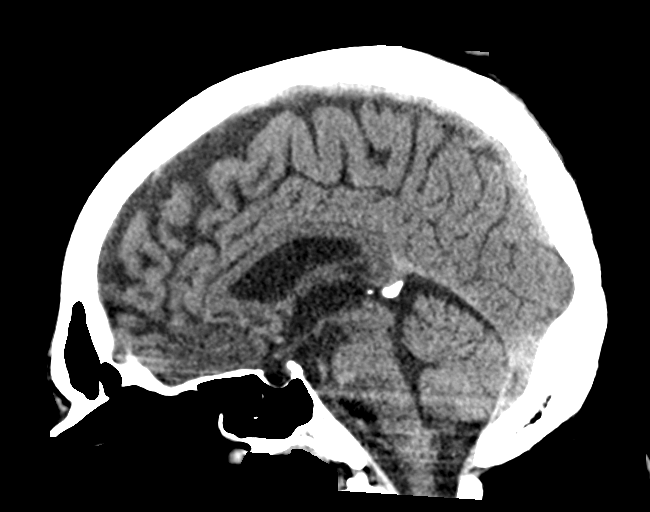

[Series 9: coronal bone · coronal · 0.27mm/px · 3 of 66 slices shown]
[im 52/66  brain]
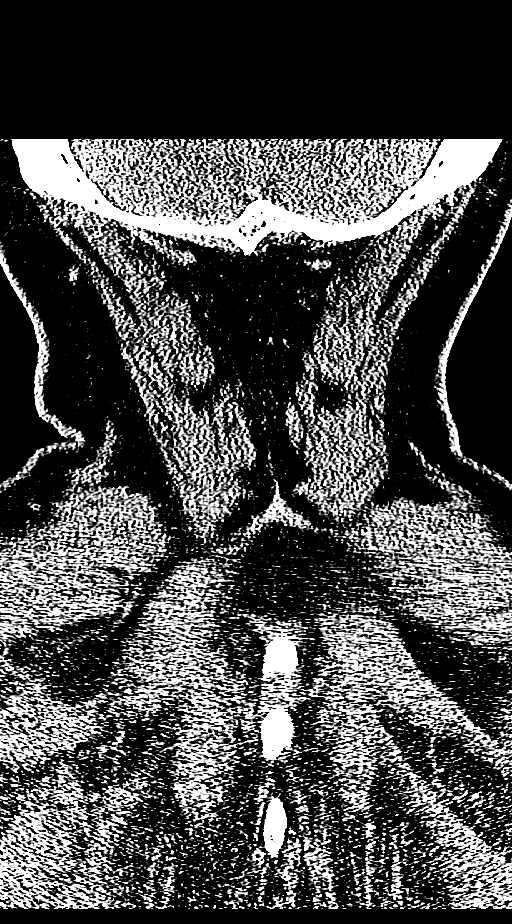
[im 54/66  brain]
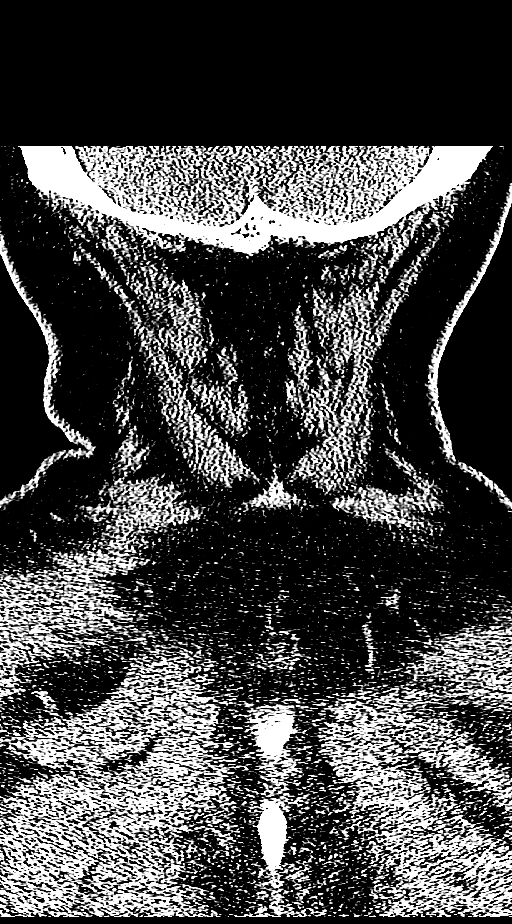
[im 57/66  brain]
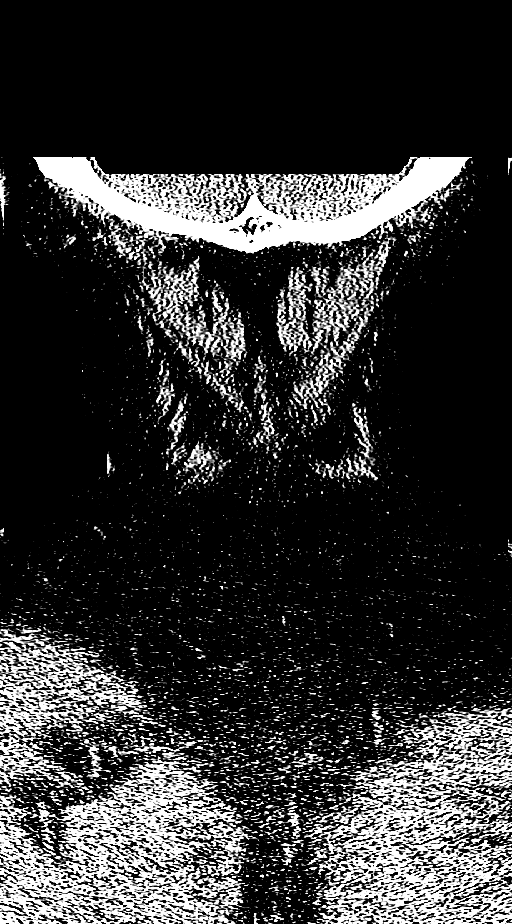

[Series 10: orthogonal bone · axial · 0.23mm/px · z∈[-388,-207]mm · 8 of 118 slices shown]
[im 9/118  bone]
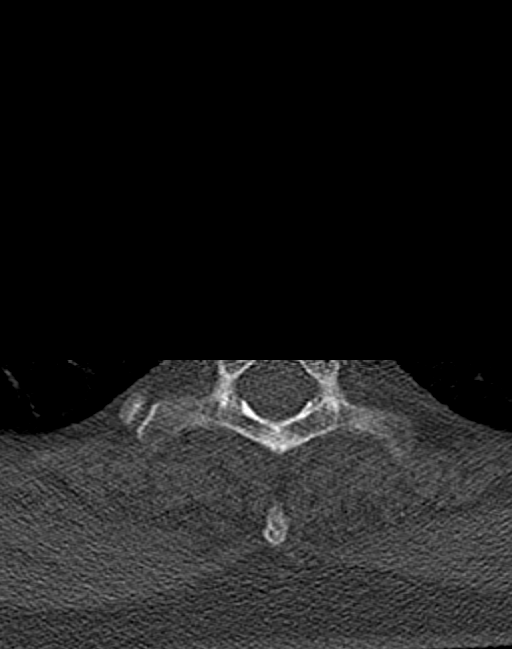
[im 26/118  bone]
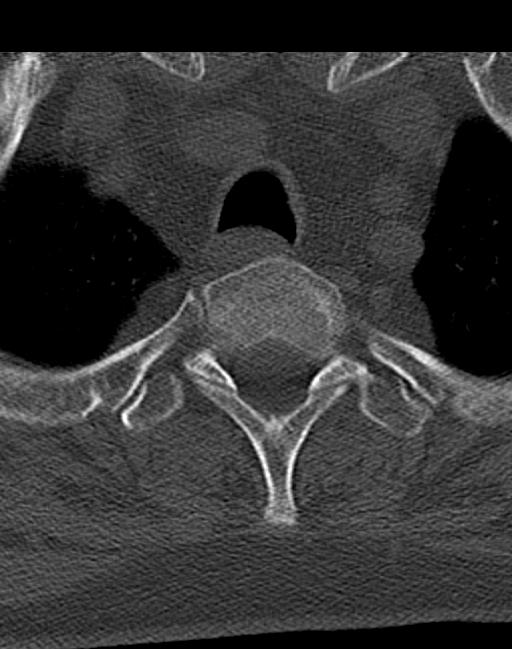
[im 42/118  bone]
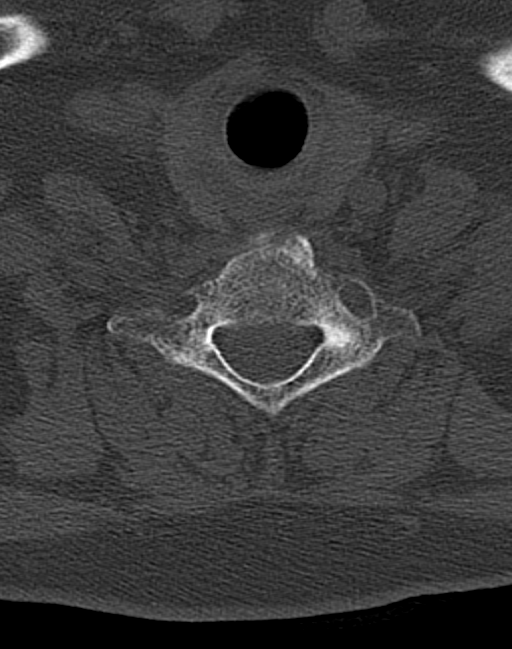
[im 51/118  bone]
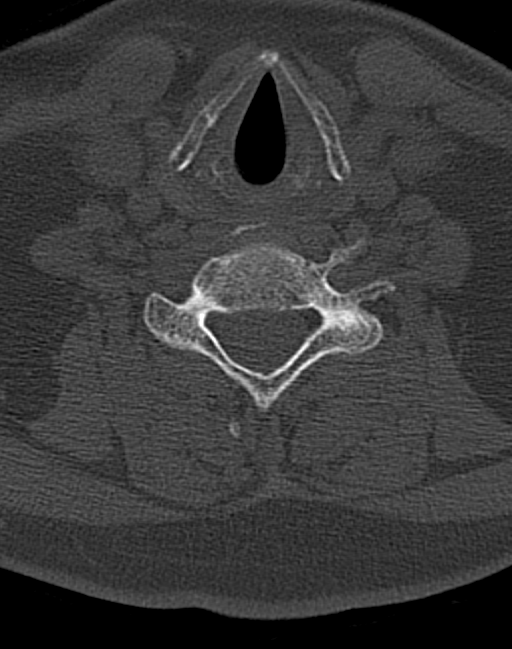
[im 67/118  bone]
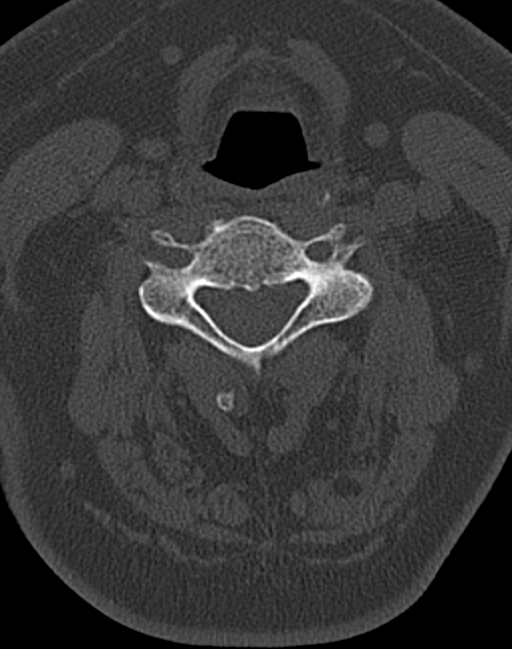
[im 76/118  bone]
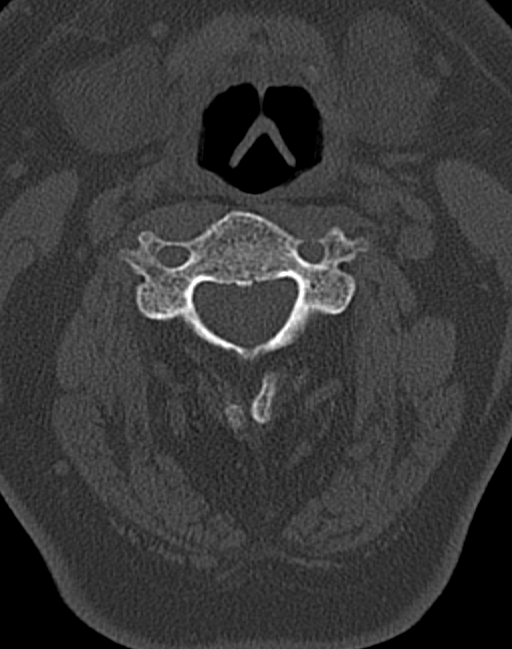
[im 92/118  bone]
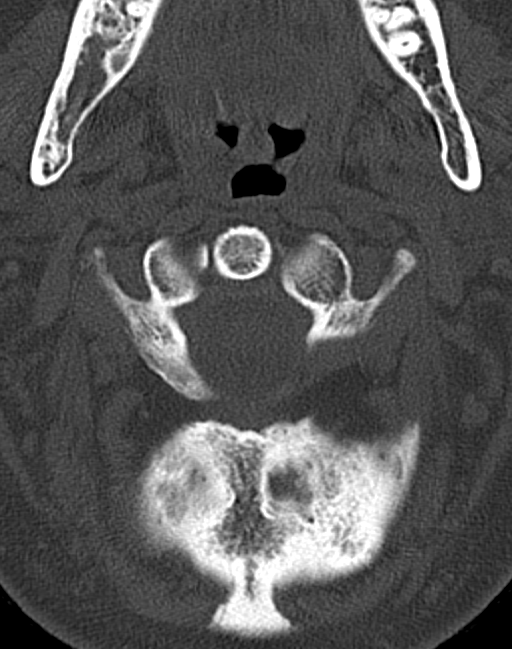
[im 109/118  bone]
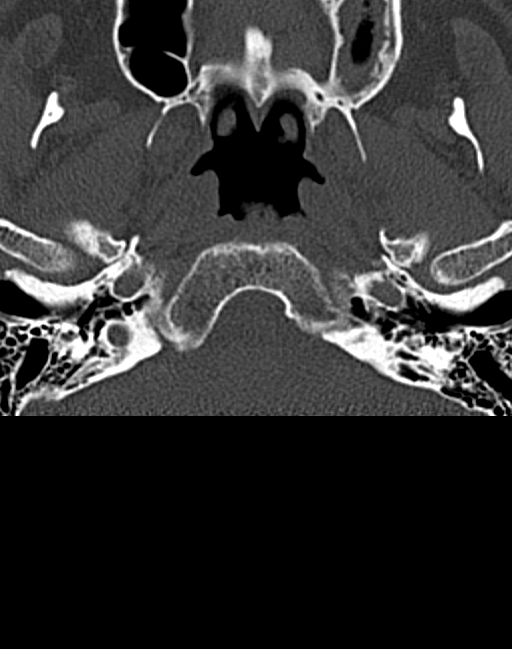

[14 of 47 positions shown; findings below may reference images not displayed]

FINDINGS: CT HEAD FINDINGS

Brain: No evidence of acute infarction, hemorrhage, hydrocephalus,
extra-axial collection or mass lesion/mass effect. Mild age-related
cerebral atrophy with compensatory dilatation of the ventricles,
similar to prior study.

Vascular: Atherosclerotic vascular calcification of the carotid
siphons. No hyperdense vessel.

Skull: Normal. Negative for fracture or focal lesion.

Sinuses/Orbits: Mild mucosal thickening in the left maxillary sinus
and bilateral posterior ethmoid air cells. The orbits are
unremarkable.

Other: None.

CT CERVICAL SPINE FINDINGS

Alignment: No traumatic malalignment.

Skull base and vertebrae: No acute fracture. No primary bone lesion
or focal pathologic process.

Soft tissues and spinal canal: No prevertebral fluid or swelling. No
visible canal hematoma.

Disc levels: Mild disc height loss at C6-C7. Anterior endplate
spurring at C3-C4, C5-C6, and C6-C7.

Upper chest: Negative.

Other: None.
IMPRESSION: 1.  No acute intracranial abnormality.
2.  No acute cervical spine fracture.

## 2018-05-17 NOTE — Telephone Encounter (Signed)
Let pt know medications will be at the pharmacy. Scheduled an appt with Heather to assist with counseling and finding alcohol treatment

## 2018-05-17 NOTE — Telephone Encounter (Signed)
-----   Message from Erlene Quan, NP sent at 05/14/2018  8:14 AM EDT ----- Please let patient know that I sent three prescriptions to the pharmacy based on his labs. Also, this patient needs counseling and referral to an alcohol treatment center

## 2018-05-30 ENCOUNTER — Telehealth: Payer: Self-pay | Admitting: Pharmacist

## 2018-05-30 NOTE — Telephone Encounter (Signed)
05/30/2018 10:03:57 AM - Dexilant & Colcrys renewal  05/30/18 Faxed [employer] Renewal application for Dexilant 60mg  Take 1 capsule by mouth once daily & Colcrys 0.6mg  Take 1 tablet by mouth 1-2 times a day with food.AJ   05/30/2018 10:02:44 AM - Ventolin HFA & Advair 100/50 renewal  05/30/18 Faxed [employer] Renewal application for Ventolin HFA 3mcg Inhale 2 puffs every four hours as needed & Advair 100/50 Inhale 1 puff two times a day, rinse mouth & spit after each use.Delos Haring

## 2018-05-31 ENCOUNTER — Ambulatory Visit: Payer: Medicaid Other | Admitting: Licensed Clinical Social Worker

## 2018-05-31 DIAGNOSIS — F1994 Other psychoactive substance use, unspecified with psychoactive substance-induced mood disorder: Secondary | ICD-10-CM

## 2018-05-31 NOTE — Progress Notes (Signed)
Total time:1 hour Type of Service: [place] outside of clinic hours Interpretor:No.   SUBJECTIVE: Miguel [name] is a 57 y.o. male  referred by Miguel Peace NP in clinic for symptoms of:  depression and alcohol abuse.. Patient is accompanied by himself.  Patient reports the following symptoms and or concerns: anxiety, depression, excessive alcohol consumption, fatigue and sleep disturbance:  Duration of problem:  Miguel [name] reports that she has been struggling with alcohol abuse for several years. He reports that he has made several attempts to stop and has been unsuccessful. He notes that when he was actively drinking that he was consuming a pint of vodka, every couple of days, and his last used was after his last appointment in clinic on June 26 th, 2019. He describes being unable to sleep, difficulty concentrating, shaking, trembling, excessive worrying, guilt, shame, nausea, and feeling uncomfortable with driving. He reports that he has been suffering from depression for awhile as evidenced by feeling down and depressed, excessive worrying, difficulty sleeping, shame and guilt, fatigue, and loss of interest in previously enjoyed activities. He denies suicidal and homicidal thoughts. He denies having access to a fire arm. He denies any history of trauma. He denies any history of drug use or abuse.  Impact on function: Miguel [name] reports that his alcoholism has affected many aspects of his life. 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 He notes that he feels that he has let his wife down because of his drinking.  Current or Hx of substance use: See above. He reports that he has been in substance abuse outpatient treatment at [facility] for group therapy in the past. He is not the best historian and it's difficult to get him to stay on  topic. He was unable to recall exactly when he was at [facility]. He notes that it was not effective and stopped attending due to feeling that he did not belong. He explains that the majority of the patients in the group were attending because of opiates and he was the only one there for alcoholism. He reports that he was hospitalized for alcoholism in May for about two weeks. He notes that he is worried about this hospital bill and how he is going to pay it. According to hospital records, he was hospitalized 04/01/2018 for alcohol withdrawl.  PSYCHIATRIC HISTORY - Medical conditions that might explain or contribute to symptoms: Miguel [name] has a history of hypertension, Tachycardia, alcoholism, chronic back pain, major depression, and leg hematoma.  - Hospitalizations/ Outpatient therapy:  Miguel [name] reports that he has been in and out of the hospital for alcoholism.  -Pharmacotherapy: According to records in his chart that he has been treated with psycho tropic medications but due to not drinking and medication non compliance that it's been ineffective: He was previously on Trazodone, Librium, and Lorazepam in the past.  -Family history of psychiatric issues: Miguel [name] did not answer to this portion of the assessment.      LIFE CONTEXT:  [name] lives with wife who has multiple scelorisis.   He reports that he has been married for thirty five years and does not have any kind of support system outside of his wife.   Currently employed:No. Miguel [name] reports that he is a full time caregiver to his wife who has  multiple sclerosis. He reports that his wife is on disability and relies on her income 830.00 per month. He notes that he sent in an application for food stamps but has had some difficulties with the disability office saying they Miguel [name] have his paperwork but he has done this twice. He notes that he has not previously been denied for a renewal and doesn't understand why he is having so many  issues. He notes that he has talked to so many different people on the phone but can't get down to the bottom of it. He explains that for the last time he was in the hospital that for alcoholism that he racked up a large hospital bill and the billing department at [name] is threatening to send him to collections.  The majority of the session, the patient focused on the issue and had to be redirected back to the assessment several times. It took the clinician nearly the whole assessment to figure out that the patient was referring to the hospital bill and whether or not he had charity care.  What is the last grade of school you completed?  Are you active with community agencies/resources/homecare? Yes Agency Name: [facility].  church, synagogue, mosque or other faith based community? No  clubs or social organizations?  What do you do for fun?  Hobbies?  Interests? Miguel [name] was unable to identify anything that he does for fun, hobbies, or any particular interests.  Recent Life changes: Problems with wife's disability reverification and food stamp reverification. His wife's health issues and his health issues.   GOALS ADDRESSED:  Patient will reduce symptoms of: anxiety, depression, stress and alcoholism.; increase ability GD:JMEQAS skills, healthy habits, self-management skills, stress reduction and alcohol treatment, will also :Increase motivation to adhere to plan of care and contact [place] for a list of public agenies providing alcohol treatment. .  INTERVENTIONS:Biopsychosocial Assessment, Psychoeducation and/or Health Education and Link to [employer], Optician, dispensing , Reflective listening, emotional support, crisis intervention/ stabilization,(cage aid 4) Standardized Assessments completed: PHQ 9=16,indication of: moderate depression. AUDIT=29 indication of: severe alcohol abuse.   ISSUES DISCUSSED: Integrated care services, support system, previous and current  coping skills, community resources , community support, things patient enjoy or use to enjoy doing, caregiver fatigue, stress, anxiety, depression, alcohol abuse, and alcohol withdrawal.     ASSESSMENT:  Patient currently experiencing symptoms of  Alcohol withdrawal, stress, anxiety, and depression.  Symptoms exacerbated by long term alcohol use, lack of adherence to treatment recommendations, insomnia, and wife's condition of multiple sclerosis.  Patient may benefit from, and is in agreement to receive further assessment and brief therapeutic interventions to assist with managing symptoms.   PLAN: . Patient will F/U with Alcoholics Anonymous . LCSW will F/U with phone call Julian Hy regarding treatment recommendations, referrals, and charity care application results. . Behavioral recommendations: Clinician provided psycho education to the patient regarding alcohol withdrawal and dependence. Clinician explained that detox in the hospital is a form of treatment, usually the first step but its not all treatment is about. Clinician explained that he should be able to leave his wife for approximately one hour to an hour in a half to attend an [place] meeting. Clinician explained that his insomnia, shaking, difficulty concentrating, anxiety, and fatigue are all in line with alcohol withdrawal symptoms.  . Referral:[facility] (In Clinic), [facility] (LME/Outside Clinic) and Substance Abuse Program,  Clinician provided the patient with contact information for [employer]  and [employer] to find a substance abuse provider for an outpatient program versus an inpatient program.   Warm Hand Off Completed.

## 2018-06-07 ENCOUNTER — Telehealth: Payer: Self-pay | Admitting: Licensed Clinical Social Worker

## 2018-06-07 NOTE — Telephone Encounter (Signed)
Clinician left a message with the patient's spouse, release of information on file to please give her a car back regarding an updated on his charity care.

## 2018-06-14 IMAGING — CR DG KNEE COMPLETE 4+V*R*
4 series · 4 of 4 positions shown · non-contrast
Comparison: None.

CLINICAL DATA: Trip on carpet and fall.  Right knee pain.

EXAM:
RIGHT KNEE - COMPLETE 4+ VIEW

[knee ap]
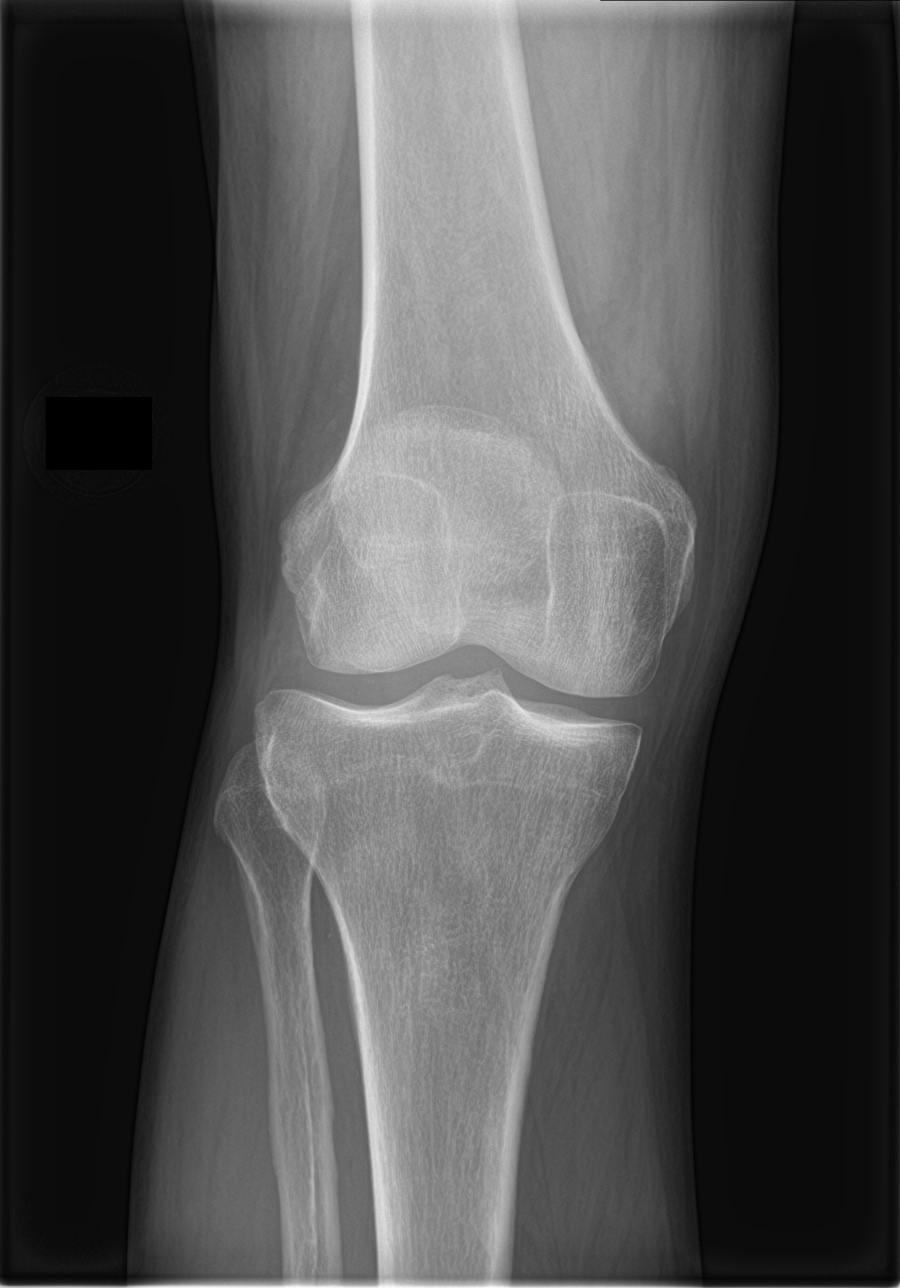

[knee obl (1 of 2)]
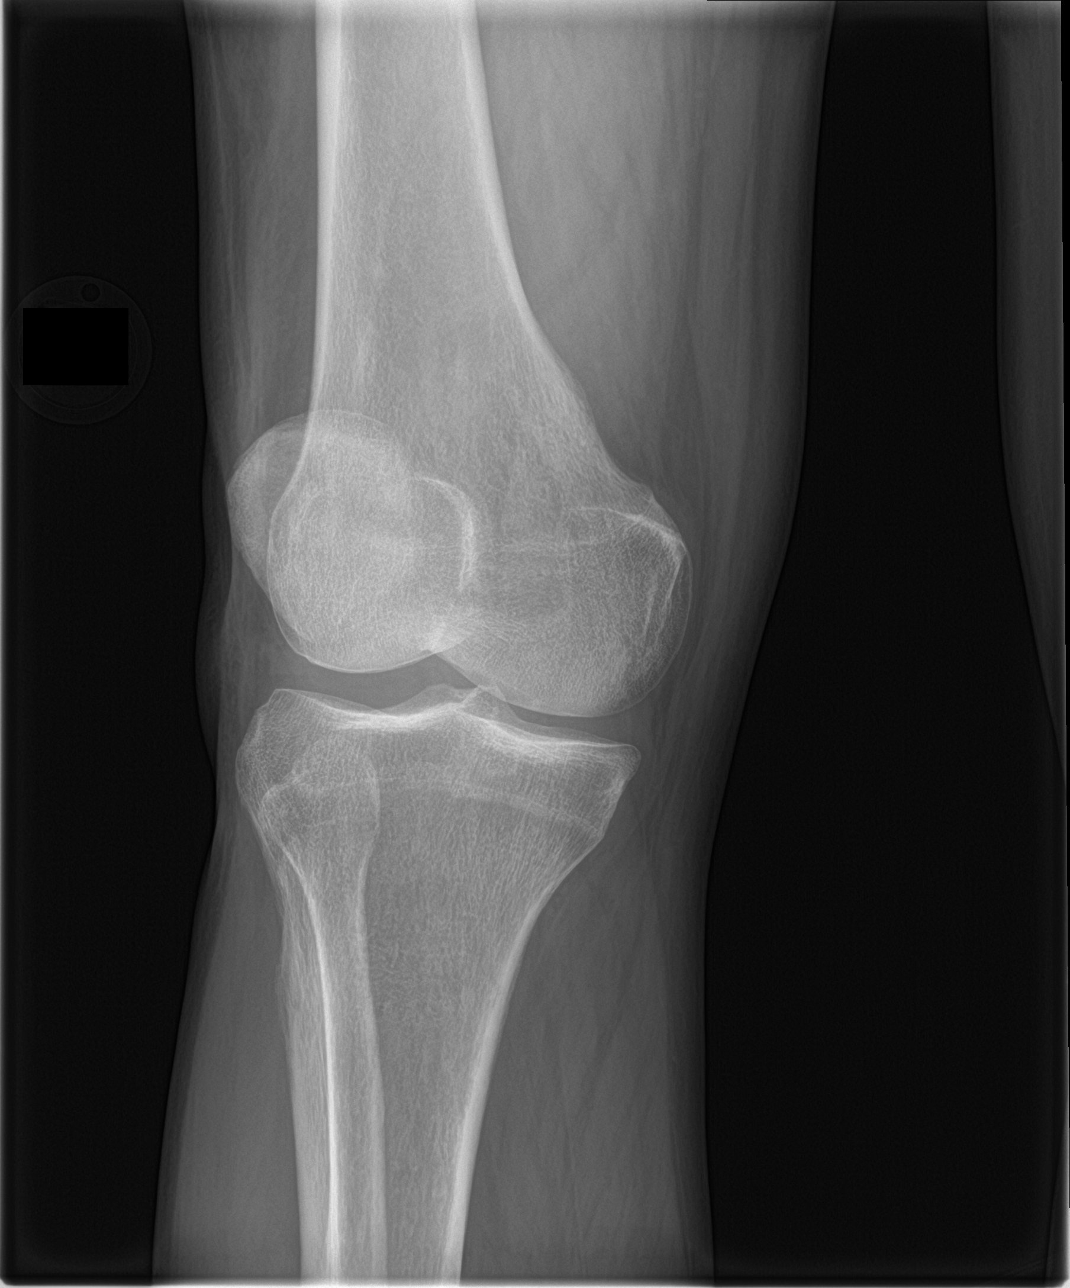

[knee obl (2 of 2)]
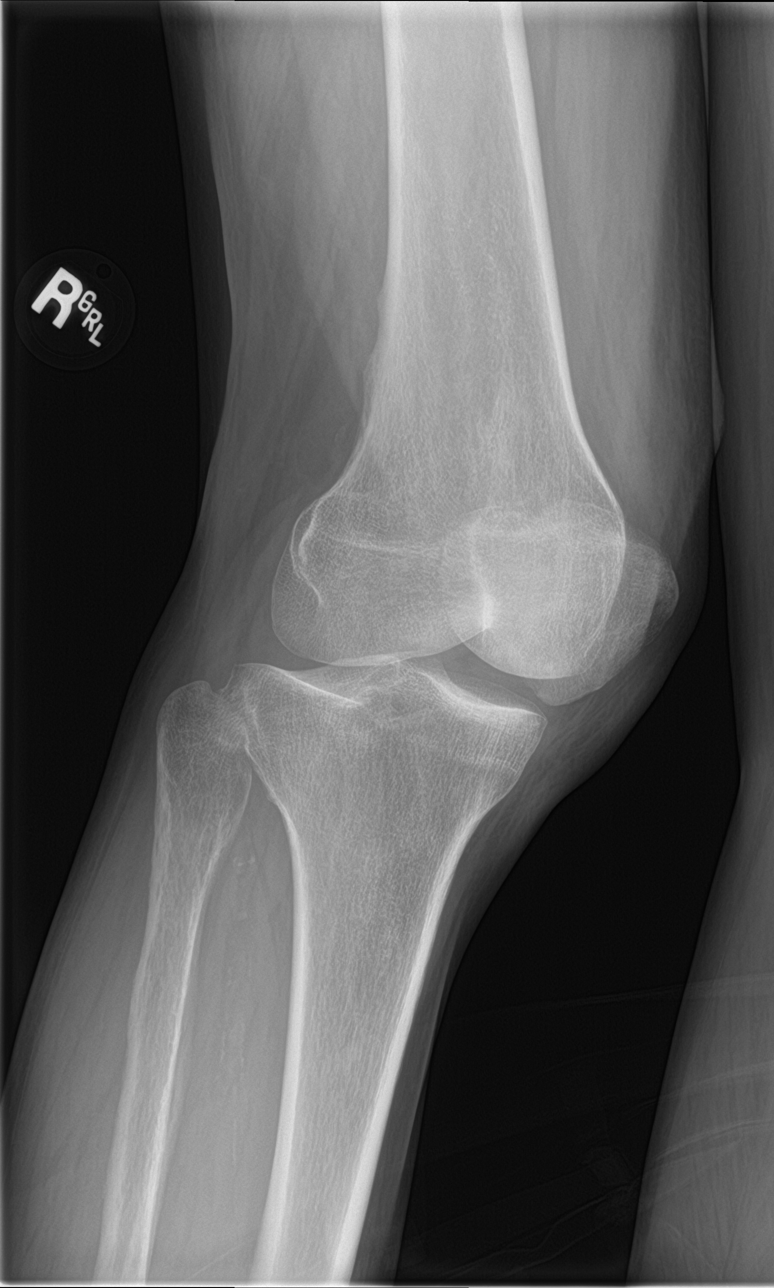

[knee lat]
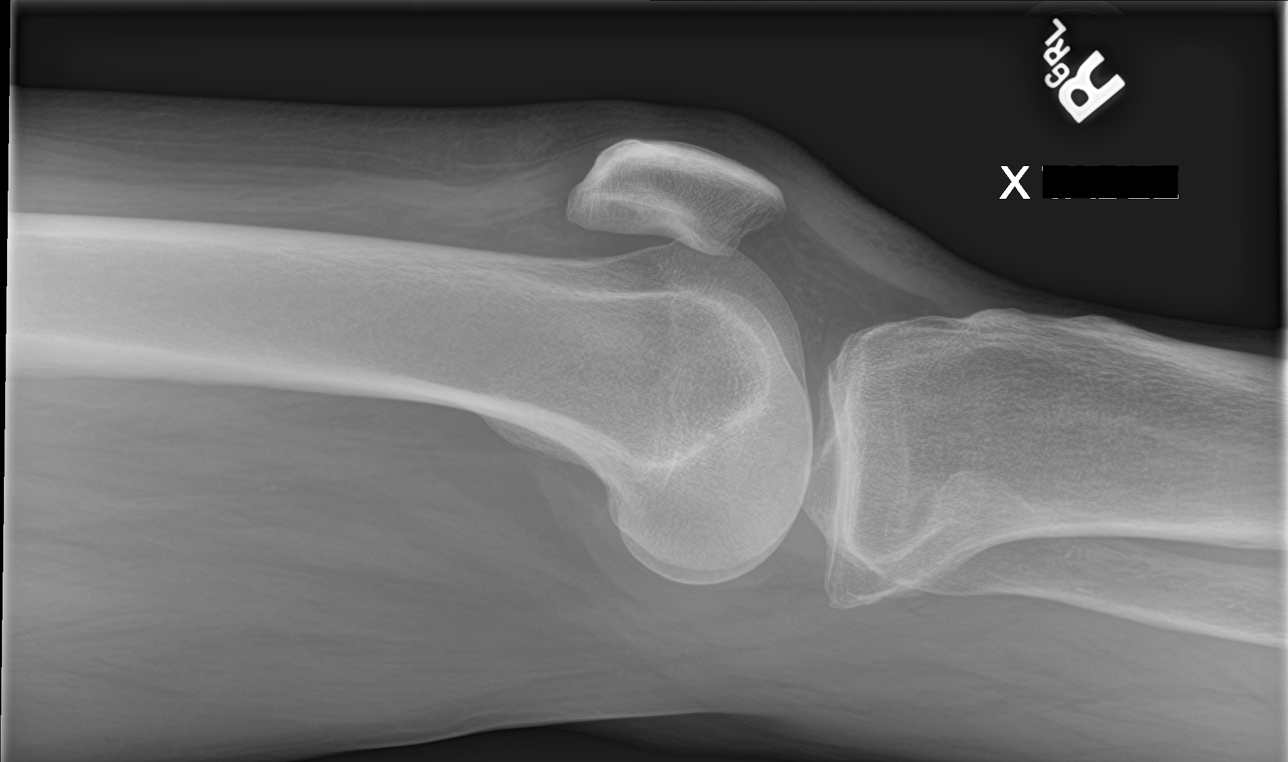

[4 of 4 positions shown; findings below may reference images not displayed]

FINDINGS: No evidence of fracture, dislocation, or joint effusion. No evidence
of arthropathy or other focal bone abnormality. Soft tissues are
unremarkable. Vascular calcifications are seen.
IMPRESSION: No fracture or subluxation of the right knee.

## 2018-06-16 ENCOUNTER — Ambulatory Visit: Payer: Self-pay | Admitting: Adult Health

## 2018-06-30 ENCOUNTER — Emergency Department: Payer: Medicaid Other

## 2018-06-30 ENCOUNTER — Emergency Department
Admission: EM | Admit: 2018-06-30 | Discharge: 2018-06-30 | Disposition: A | Discharge status: Home / Self Care | Payer: Medicaid Other | Attending: Emergency Medicine | Admitting: Emergency Medicine

## 2018-06-30 ENCOUNTER — Other Ambulatory Visit: Payer: Self-pay

## 2018-06-30 DIAGNOSIS — R109 Unspecified abdominal pain: Secondary | ICD-10-CM | POA: Insufficient documentation

## 2018-06-30 DIAGNOSIS — Z79899 Other long term (current) drug therapy: Secondary | ICD-10-CM | POA: Insufficient documentation

## 2018-06-30 DIAGNOSIS — J45909 Unspecified asthma, uncomplicated: Secondary | ICD-10-CM | POA: Insufficient documentation

## 2018-06-30 DIAGNOSIS — Z87891 Personal history of nicotine dependence: Secondary | ICD-10-CM | POA: Insufficient documentation

## 2018-06-30 DIAGNOSIS — I10 Essential (primary) hypertension: Secondary | ICD-10-CM | POA: Insufficient documentation

## 2018-06-30 DIAGNOSIS — N390 Urinary tract infection, site not specified: Secondary | ICD-10-CM | POA: Insufficient documentation

## 2018-06-30 LAB — CBC
HCT: 36.7 % — ABNORMAL LOW (ref 40.0–52.0)
Hemoglobin: 11.9 g/dL — ABNORMAL LOW (ref 13.0–18.0)
MCH: 24.6 pg — ABNORMAL LOW (ref 26.0–34.0)
MCHC: 32.5 g/dL (ref 32.0–36.0)
MCV: 75.6 fL — ABNORMAL LOW (ref 80.0–100.0)
Platelets: 267 10*3/uL (ref 150–440)
RBC: 4.85 MIL/uL (ref 4.40–5.90)
RDW: 21.3 % — ABNORMAL HIGH (ref 11.5–14.5)
WBC: 6.4 10*3/uL (ref 3.8–10.6)

## 2018-06-30 LAB — COMPREHENSIVE METABOLIC PANEL
ALT: 15 U/L (ref 0–44)
AST: 12 U/L — ABNORMAL LOW (ref 15–41)
Albumin: 4.3 g/dL (ref 3.5–5.0)
Alkaline Phosphatase: 57 U/L (ref 38–126)
Anion gap: 8 (ref 5–15)
BUN: 16 mg/dL (ref 6–20)
CO2: 22 mmol/L (ref 22–32)
Calcium: 9.6 mg/dL (ref 8.9–10.3)
Chloride: 109 mmol/L (ref 98–111)
Creatinine, Ser: 1.08 mg/dL (ref 0.61–1.24)
GFR calc Af Amer: >60 mL/min (ref >60)
GFR calc non Af Amer: >60 mL/min (ref >60)
Glucose, Bld: 124 mg/dL — ABNORMAL HIGH (ref 70–99)
Potassium: 4.1 mmol/L (ref 3.5–5.1)
Sodium: 139 mmol/L (ref 135–145)
Total Bilirubin: 0.4 mg/dL (ref 0.3–1.2)
Total Protein: 7.4 g/dL (ref 6.5–8.1)

## 2018-06-30 LAB — URINALYSIS, COMPLETE (UACMP) WITH MICROSCOPIC
Bacteria, UA: NONE SEEN
Bilirubin Urine: NEGATIVE
Glucose, UA: NEGATIVE mg/dL
Hgb urine dipstick: NEGATIVE
Ketones, ur: NEGATIVE mg/dL
Nitrite: NEGATIVE
Protein, ur: NEGATIVE mg/dL
Specific Gravity, Urine: 1.015 (ref 1.005–1.030)
pH: 5 (ref 5.0–8.0)

## 2018-06-30 LAB — LIPASE, BLOOD: Lipase: 46 U/L (ref 11–51)

## 2018-06-30 MED ORDER — CIPROFLOXACIN HCL 500 MG PO TABS: 500.0000 mg | ORAL_TABLET | Freq: Two times a day (BID) | ORAL | 0 refills | Status: DC

## 2018-06-30 NOTE — Discharge Instructions (Addendum)
Please return for worse pain fever vomiting.  Please follow-up with your regular doctor on the 25th at the appointment you have scheduled already.  Use Tylenol or Advil for the pain in your abdomen.  Make sure you take the Advil 2 or 3 of the over-the-counter pills 3 times a day with food and only for a few days - 3 or 4 days.  Take the Cipro 1 pill twice a day.  Remember rarely can cause tendon problems or if you get any bad pain in your arms or legs that you do not have currently come back right away and stop the antibiotic.  This is very rare however.

## 2018-06-30 NOTE — ED Triage Notes (Signed)
Pt c/o right lateral abd pain for the past 10 days. Denies N/V/D. States he has had constipation

## 2018-06-30 NOTE — ED Notes (Signed)
MD to bedside with update

## 2018-06-30 NOTE — ED Provider Notes (Signed)
[facility] Emergency Department Provider Note   ____________________________________________   First MD Initiated Contact with Patient 06/30/18 1112     (approximate)  I have reviewed the triage vital signs and the nursing notes.   HISTORY  Chief Complaint Abdominal Pain    HPI WALLIS VANCOTT is a 57 y.o. male complains of right lower quadrant pain off and on for 10 days has been getting worse worse yesterday.  Hurts more with deep breathing movement or he thinks if he hit bumps in the road but he tries to avoid the bumps.  He has had some nausea but no vomiting or diarrhea he has had no fever he said he quit drinking about a month ago because his wife has MS and he cannot really take care of or if he is drinking.   Past Medical History:  Diagnosis Date  . Alcohol abuse   . Asthma   . GERD (gastroesophageal reflux disease)   . Gout   . Hypertension   . OCD (obsessive compulsive disorder)   . Renal colic     Patient Active Problem List   Diagnosis Date Noted  . Self-inflicted laceration of wrist 03/10/2018  . Leg hematoma 12/25/2017  . Suicide and self-inflicted injury by cutting and piercing instrument ([place]) 03/10/2017  . Severe recurrent major 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 ([place]) 03/09/2017  . Substance induced mood disorder ([place]) 08/15/2016  . Involuntary commitment 08/15/2016  . Alcohol use disorder, severe, dependence ([place]) 02/05/2016  . Hypertension 12/05/2015  . Tachycardia 12/05/2015  . Gout 11/13/2015  . Chronic back pain 05/02/2015    Past Surgical History:  Procedure Laterality Date  . CHOLECYSTECTOMY  2012    Prior to Admission medications   Medication Sig Start Date End Date Taking? Authorizing Provider  allopurinol (ZYLOPRIM) 300 MG tablet Take 1 tablet (300 mg total) by mouth daily. 05/14/18   Tukov-Yual, Arlyss Gandy, NP  chlordiazePOXIDE (LIBRIUM) 25 MG capsule Take 1 capsule (25 mg total) by mouth 3 (three) times  daily as needed for withdrawal. 04/01/18   Darel Hong, MD  ciprofloxacin (CIPRO) 500 MG tablet Take 1 tablet (500 mg total) by mouth 2 (two) times daily for 10 days. 06/30/18 07/10/18  Nena Polio, MD  citalopram (CELEXA) 10 MG tablet Take 1 tablet (10 mg total) by mouth at bedtime. 05/14/18   Tukov-Yual, Arlyss Gandy, NP  cloNIDine (CATAPRES) 0.1 MG tablet Take 1 tablet (0.1 mg total) by mouth 2 (two) times daily. 05/12/18   Tukov-Yual, Arlyss Gandy, NP  colchicine 0.6 MG tablet Take 1 tablet (0.6 mg total) by mouth daily. 05/12/18   Tukov-Yual, Arlyss Gandy, NP  dexlansoprazole (DEXILANT) 60 MG capsule Take 60 mg by mouth every morning.    [provider]  diphenhydrAMINE (BENADRYL) 50 MG tablet Take 1 tablet (50 mg total) by mouth at bedtime as needed for itching. 05/12/18   Tukov-Yual, Arlyss Gandy, NP  ferrous sulfate 325 (65 FE) MG tablet Take 1 tablet (325 mg total) by mouth 2 (two) times daily with a meal. Patient not taking: Reported on 05/12/2018 12/28/17   Gladstone Lighter, MD  fluticasone [facility]) 50 MCG/ACT nasal spray Place 1 spray into both nostrils 2 (two) times daily. 05/12/18   Tukov-Yual, Arlyss Gandy, NP  Fluticasone-Salmeterol (ADVAIR DISKUS) 100-50 MCG/DOSE AEPB Inhale 1 puff into the lungs 2 (two) times daily. 05/12/18   Tukov-Yual, Arlyss Gandy, NP  folic acid (FOLVITE) 1 MG tablet Take 1 tablet (1 mg total) by mouth  daily. 05/12/18   Tukov-Yual, Arlyss Gandy, NP  LORazepam (ATIVAN) 1 MG tablet Take 1 tablet (1 mg total) by mouth every 8 (eight) hours as needed for anxiety. 03/08/18 03/08/19  Eula Listen, MD  magnesium oxide (MAG-OX) 400 (241.3 Mg) MG tablet Take 1 tablet (400 mg total) by mouth daily. 05/14/18   Tukov-Yual, Arlyss Gandy, NP  Multiple Vitamins-Minerals (MULTIVITAMIN WITH MINERALS) tablet Take 1 tablet by mouth daily. 05/12/18   Tukov-Yual, Arlyss Gandy, NP  pantoprazole (PROTONIX) 40 MG tablet Take 1 tablet (40 mg total) by mouth 2 (two) times daily  before a meal. 05/12/18   Tukov-Yual, Arlyss Gandy, NP  tamsulosin (FLOMAX) 0.4 MG CAPS capsule Take 1 capsule (0.4 mg total) by mouth daily. Patient not taking: Reported on 05/12/2018 12/28/17   Gladstone Lighter, MD  thiamine (VITAMIN B-1) 100 MG tablet Take 1 tablet (100 mg total) by mouth daily. 05/12/18   Tukov-Yual, Arlyss Gandy, NP  traZODone (DESYREL) 50 MG tablet Take 1 tablet (50 mg total) by mouth at bedtime as needed for sleep. Patient not taking: Reported on 05/12/2018 12/28/17   Gladstone Lighter, MD  VENTOLIN HFA 108 [phone] Base) MCG/ACT inhaler INHALE 2 PUFFS EVERY 4 HOURS AS NEEDED 10/18/17   Tawni Millers, MD    Allergies Patient has no known allergies.  Family History  Problem Relation Age of Onset  . Alcohol abuse Father     Social History Social History   Tobacco Use  . Smoking status: Former Research scientist (life sciences)  . Smokeless tobacco: Never Used  . Tobacco comment: quit 30 years ago  Substance Use Topics  . Alcohol use: Not Currently    Alcohol/week: 7.0 standard drinks    Types: 7 Glasses of wine per week    Comment: Pt states, "I quit all the hard stuff 4 months ago"  . Drug use: No    Review of Systems  Constitutional: No fever/chills Eyes: No visual changes. ENT: No sore throat. Cardiovascular: Denies chest pain. Respiratory: Denies shortness of breath. Gastrointestinal: No abdominal pain.  No nausea, no vomiting.  No diarrhea.  No constipation. Genitourinary: Negative for dysuria. Musculoskeletal: Negative for back pain. Skin: Negative for rash. Neurological: Negative for headaches, focal weakness    ____________________________________________   PHYSICAL EXAM:  VITAL SIGNS: ED Triage Vitals  Enc Vitals Group     BP 06/30/18 1007 (!) 176/95     Pulse Rate 06/30/18 1007 79     Resp 06/30/18 1007 18     Temp 06/30/18 1007 97.7 F (36.5 C)     Temp Source 06/30/18 1007 Oral     SpO2 06/30/18 1007 100 %     Weight 06/30/18 1008 200 lb (90.7 kg)     Height  06/30/18 1008 6' (1.829 m)     Head Circumference --      Peak Flow --      Pain Score 06/30/18 1008 8     Pain Loc --      Pain Edu? --      Excl. in [place]? --     Constitutional: Alert and oriented. Well appearing and in no acute distress. Eyes: Conjunctivae are normal. PERRL. EOMI. Head: Atraumatic. Nose: No congestion/rhinnorhea. Mouth/Throat: Mucous membranes are moist.  Oropharynx non-erythematous. Neck: No stridor.   Cardiovascular: Normal rate, regular rhythm. Grossly normal heart sounds.  Good peripheral circulation. Respiratory: Normal respiratory effort.  No retractions. Lungs CTAB. Gastrointestinal: Soft somewhat tender on the right side and right lower quadrant to palpation and percussion.  No distention. No abdominal bruits. No CVA tenderness. Musculoskeletal: No lower extremity tenderness nor edema.  No joint effusions. Neurologic:  Normal speech and language. No gross focal neurologic deficits are appreciated. No gait instability. Skin:  Skin is warm, dry and intact. No rash noted. Psychiatric: Mood and affect are normal. Speech and behavior are normal.  ____________________________________________   LABS (all labs ordered are listed, but only abnormal results are displayed)  Labs Reviewed  COMPREHENSIVE METABOLIC PANEL - Abnormal; Notable for the following components:      Result Value   Glucose, Bld 124 (*)    AST 12 (*)    All other components within normal limits  CBC - Abnormal; Notable for the following components:   Hemoglobin 11.9 (*)    HCT 36.7 (*)    MCV 75.6 (*)    MCH 24.6 (*)    RDW 21.3 (*)    All other components within normal limits  URINALYSIS, COMPLETE (UACMP) WITH MICROSCOPIC - Abnormal; Notable for the following components:   Color, Urine AMBER (*)    APPearance HAZY (*)    Leukocytes, UA TRACE (*)    All other components within normal limits  URINE CULTURE  LIPASE, BLOOD    ____________________________________________  EKG   ____________________________________________  RADIOLOGY  ED MD interpretation: T read by radiology shows no acute pathology.  Official radiology report(s): Ct Abdomen Pelvis Wo Contrast  Result Date: 06/30/2018 CLINICAL DATA:  Acute right-sided abdominal pain. EXAM: CT ABDOMEN AND PELVIS WITHOUT CONTRAST TECHNIQUE: Multidetector CT imaging of the abdomen and pelvis was performed following the standard protocol without IV contrast. COMPARISON:  CT scan of March 02, 2018. FINDINGS: Lower chest: No acute abnormality. Hepatobiliary: No focal liver abnormality is seen. Status post cholecystectomy. No biliary dilatation. Pancreas: Unremarkable. No pancreatic ductal dilatation or surrounding inflammatory changes. Spleen: Normal in size without focal abnormality. Adrenals/Urinary Tract: Adrenal glands appear normal. Bilateral nephrolithiasis is noted. No hydronephrosis or renal obstruction is noted. No ureteral calculi are noted. Urinary bladder is unremarkable. Stomach/Bowel: Stomach is within normal limits. Appendix appears normal. No evidence of bowel wall thickening, distention, or inflammatory changes. Sigmoid diverticulosis is noted without inflammation. Vascular/Lymphatic: No significant vascular findings are present. No enlarged abdominal or pelvic lymph nodes. Reproductive: Stable mild prostatic enlargement is noted. Other: No abdominal wall hernia or abnormality. No abdominopelvic ascites. Musculoskeletal: No acute or significant osseous findings. IMPRESSION: Bilateral nonobstructive nephrolithiasis. No hydronephrosis or renal obstruction is noted. Sigmoid diverticulosis without inflammation. Stable mild prostatic enlargement. Electronically Signed   By: Marijo Conception, M.D.   On: 06/30/2018 12:09    ____________________________________________   PROCEDURES  Procedure(s) performed:   Procedures  Critical Care performed:    ____________________________________________   INITIAL IMPRESSION / ASSESSMENT AND PLAN / ED COURSE  Patient has some white blood cells in his urine but no dysuria.  He does have the abdominal pain more on the right side.  I will give him some antibiotics for this.  I will use Tylenol or Motrin for the pain if he needs it.  He reports and has told me several times now that he recently stopped drinking heavily about 2 months ago therefore I will not give him any narcotic pain medicine.  I will have him follow-up with his regular doctor return here if he is worse.  I will give him some antibiotics for what appears to be UTI.  I need to add that I did a rectal exam there is no rectal masses no  blood and no prostate tenderness.         ____________________________________________   FINAL CLINICAL IMPRESSION(S) / ED DIAGNOSES  Final diagnoses:  Abdominal pain, unspecified abdominal location  Urinary tract infection without hematuria, site unspecified     ED Discharge Orders         Ordered    ciprofloxacin (CIPRO) 500 MG tablet  2 times daily     06/30/18 1447           Note:  This document was prepared using Dragon voice recognition software and may include unintentional dictation errors.    Nena Polio, MD 06/30/18 2115

## 2018-07-01 LAB — URINE CULTURE: Culture: NO GROWTH

## 2018-07-04 IMAGING — CR DG CHEST 2V
2 series · 2 of 2 positions shown · non-contrast
Comparison: Chest x-ray dated 08/09/2017.

CLINICAL DATA: Right side facial "numbness" X 4 days. Non Smoker.
Hx of asthma

EXAM:
CHEST  2 VIEW

[chest pa]
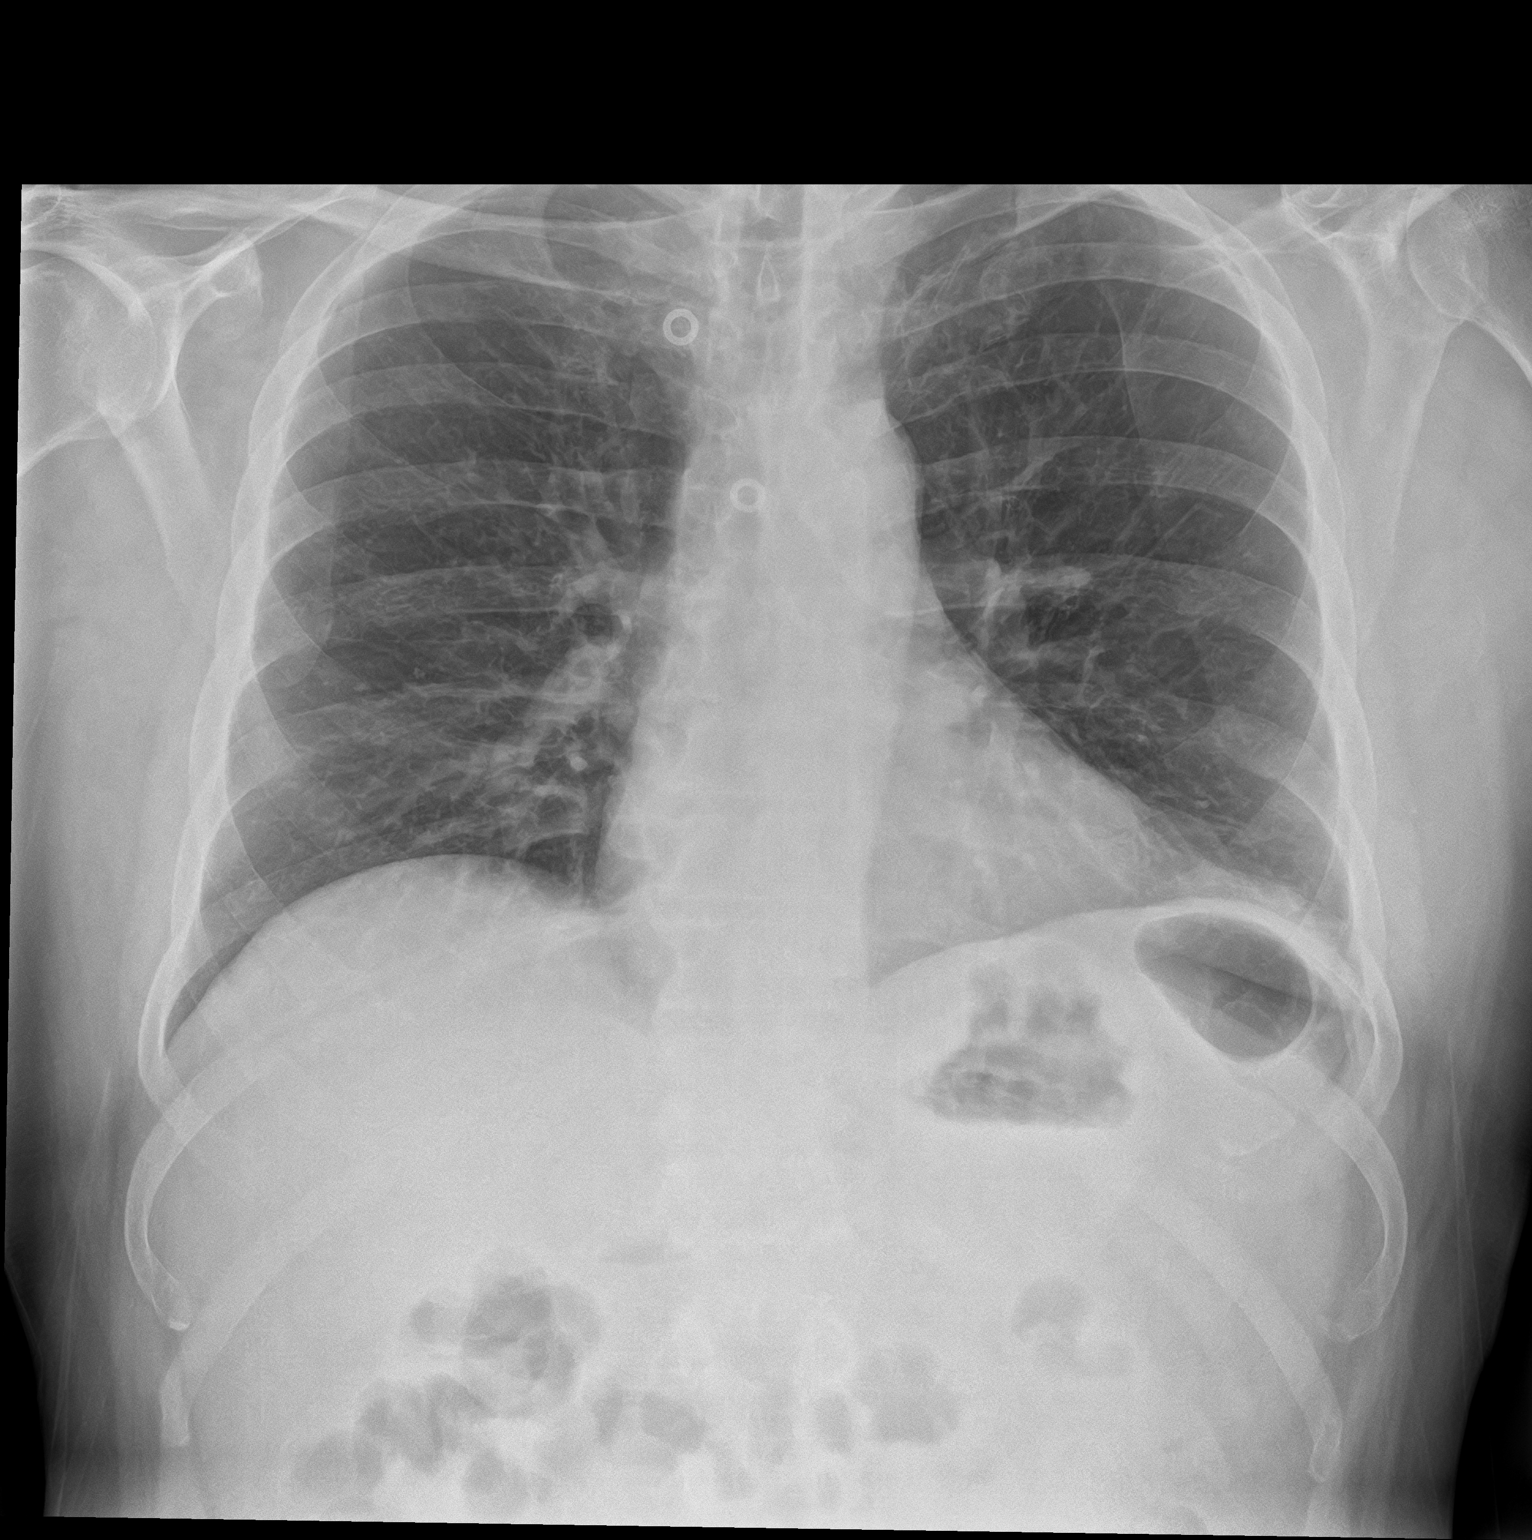

[chest lat]
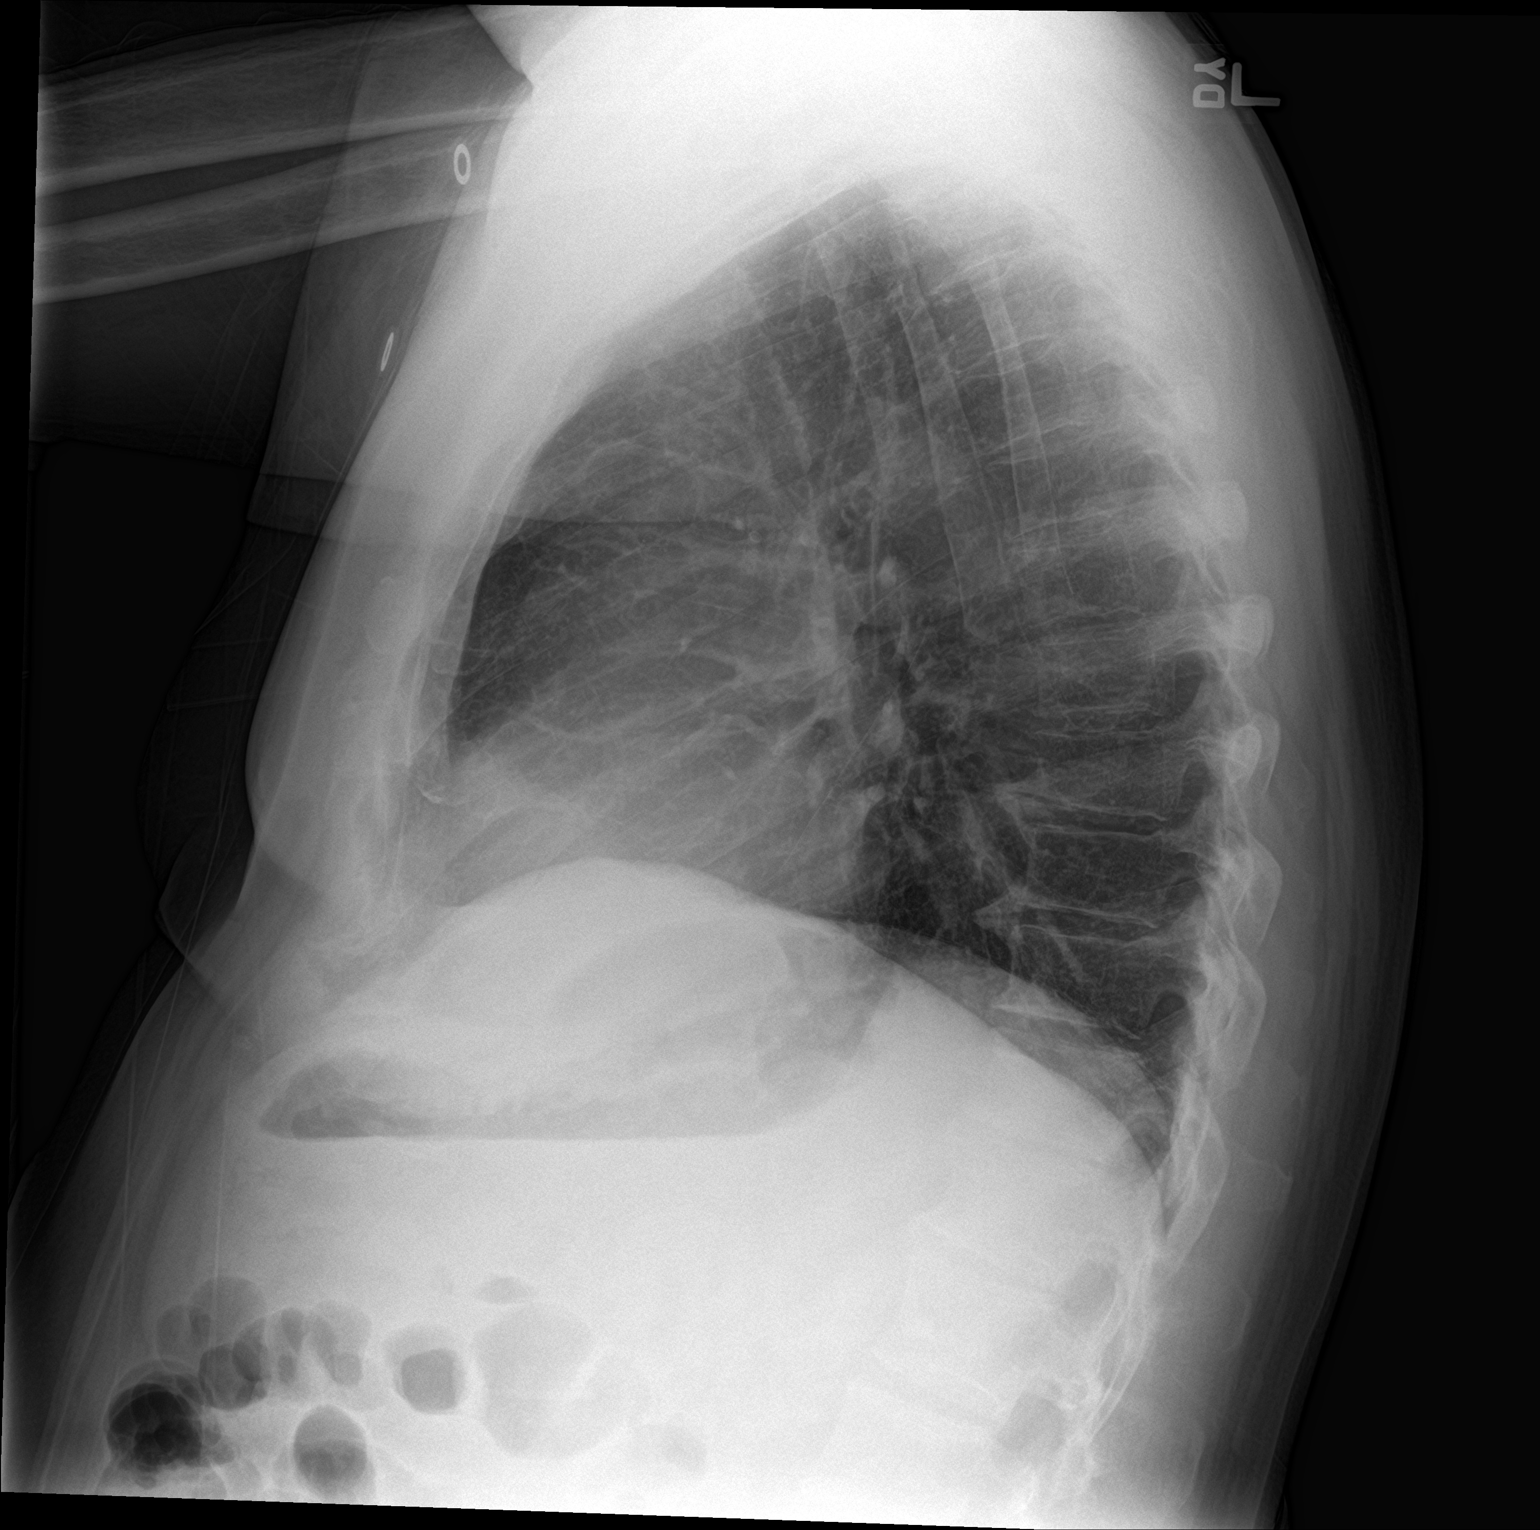

[2 of 2 positions shown; findings below may reference images not displayed]

FINDINGS: Heart size and mediastinal contours are within normal limits. Lungs
are clear. No pleural effusion or pneumothorax seen. No acute or
suspicious osseous finding.
IMPRESSION: No active cardiopulmonary disease. No evidence of pneumonia or
pulmonary edema.

## 2018-07-07 ENCOUNTER — Encounter: Payer: Self-pay | Admitting: Adult Health

## 2018-07-07 ENCOUNTER — Ambulatory Visit: Payer: Medicaid Other | Admitting: Adult Health

## 2018-07-07 VITALS — BP 151/88 | Temp 98.0°F | Ht 71.25 in | Wt 188.2 lb

## 2018-07-07 DIAGNOSIS — F1994 Other psychoactive substance use, unspecified with psychoactive substance-induced mood disorder: Secondary | ICD-10-CM

## 2018-07-07 DIAGNOSIS — F429 Obsessive-compulsive disorder, unspecified: Secondary | ICD-10-CM

## 2018-07-07 DIAGNOSIS — M109 Gout, unspecified: Secondary | ICD-10-CM

## 2018-07-07 DIAGNOSIS — K219 Gastro-esophageal reflux disease without esophagitis: Secondary | ICD-10-CM

## 2018-07-07 DIAGNOSIS — F1020 Alcohol dependence, uncomplicated: Secondary | ICD-10-CM

## 2018-07-07 DIAGNOSIS — I10 Essential (primary) hypertension: Secondary | ICD-10-CM

## 2018-07-07 DIAGNOSIS — J4520 Mild intermittent asthma, uncomplicated: Secondary | ICD-10-CM

## 2018-07-07 DIAGNOSIS — J45909 Unspecified asthma, uncomplicated: Secondary | ICD-10-CM | POA: Insufficient documentation

## 2018-07-07 MED ORDER — MAGNESIUM OXIDE 400 (241.3 MG) MG PO TABS: 400.0000 mg | ORAL_TABLET | Freq: Every day | ORAL | 3 refills | Status: DC

## 2018-07-07 MED ORDER — ALLOPURINOL 300 MG PO TABS: 300.0000 mg | ORAL_TABLET | Freq: Every day | ORAL | 3 refills | Status: DC

## 2018-07-07 MED ORDER — MULTI-VITAMIN/MINERALS PO TABS: 1.0000 | ORAL_TABLET | Freq: Every day | ORAL | 3 refills | Status: DC

## 2018-07-07 MED ORDER — ALBUTEROL SULFATE HFA 108 (90 BASE) MCG/ACT IN AERS: 2.0000 | INHALATION_SPRAY | RESPIRATORY_TRACT | 0 refills | Status: DC | PRN

## 2018-07-07 MED ORDER — VITAMIN B-1 100 MG PO TABS: 100.0000 mg | ORAL_TABLET | Freq: Every day | ORAL | 3 refills | Status: DC

## 2018-07-07 MED ORDER — PANTOPRAZOLE SODIUM 40 MG PO TBEC: 40.0000 mg | DELAYED_RELEASE_TABLET | Freq: Two times a day (BID) | ORAL | 3 refills | Status: DC

## 2018-07-07 MED ORDER — FLUTICASONE-SALMETEROL 100-50 MCG/DOSE IN AEPB: 1.0000 | INHALATION_SPRAY | Freq: Two times a day (BID) | RESPIRATORY_TRACT | 0 refills | Status: DC

## 2018-07-07 MED ORDER — CLONIDINE HCL 0.1 MG PO TABS: 0.1000 mg | ORAL_TABLET | Freq: Two times a day (BID) | ORAL | 3 refills | Status: DC

## 2018-07-07 MED ORDER — CITALOPRAM HYDROBROMIDE 10 MG PO TABS: 10.0000 mg | ORAL_TABLET | Freq: Every day | ORAL | 2 refills | Status: DC

## 2018-07-07 MED ORDER — FOLIC ACID 1 MG PO TABS: 1.0000 mg | ORAL_TABLET | Freq: Every day | ORAL | 3 refills | Status: DC

## 2018-07-07 MED ORDER — FERROUS SULFATE 325 (65 FE) MG PO TABS: 325.0000 mg | ORAL_TABLET | Freq: Two times a day (BID) | ORAL | 3 refills | Status: DC

## 2018-07-07 NOTE — Progress Notes (Signed)
Patient: Miguel [name] Male    DOB: 1961/08/08   57 y.o.   MRN: [id number] Visit Date: 07/07/2018  Today's Provider: Deforest Hoyles, NP   Chief Complaint  Patient presents with  . Follow-up    C/O tremors, 'stomach ache', constipation   Subjective:    HPI 57 y/o male with a h/o ETOH abuse in remission presenting with tremors, constipation and abdominal pain that started a few weeks ago.  He states that he is unable to sleep or function.  He denies drinking any alcohol.  He was seen in the emergency room on 30 June 2018 with same symptoms.  He was treated for UTI with ciprofloxacin and discharged home.  He reports completing the antibiotic course.  He denies any diarrhea or any medication associated symptoms On review of his medications, I noticed that patient is not taking the medications that he was started on during his last visit.  He was started on Celexa 10 mg nightly, thiamine, folic acid, Protonix, multivitamins and magnesium oxide.  He reports occasional shortness of breath that is relieved with albuterol and Advair.  He denies chest pain but reports palpitations.  A toxicology was not done the last time he was in the ED hence it is difficult to tell if patient is truly abstinent from alcohol.  .  No Known Allergies Previous Medications   ALLOPURINOL (ZYLOPRIM) 300 MG TABLET    Take 1 tablet (300 mg total) by mouth daily.   CHLORDIAZEPOXIDE (LIBRIUM) 25 MG CAPSULE    Take 1 capsule (25 mg total) by mouth 3 (three) times daily as needed for withdrawal.   CIPROFLOXACIN (CIPRO) 500 MG TABLET    Take 1 tablet (500 mg total) by mouth 2 (two) times daily for 10 days.   CITALOPRAM (CELEXA) 10 MG TABLET    Take 1 tablet (10 mg total) by mouth at bedtime.   CLONIDINE (CATAPRES) 0.1 MG TABLET    Take 1 tablet (0.1 mg total) by mouth 2 (two) times daily.   COLCHICINE 0.6 MG TABLET    Take 1 tablet (0.6 mg total) by mouth daily.   DEXLANSOPRAZOLE (DEXILANT) 60 MG CAPSULE    Take 60 mg by  mouth every morning.   DIPHENHYDRAMINE (BENADRYL) 50 MG TABLET    Take 1 tablet (50 mg total) by mouth at bedtime as needed for itching.   FERROUS SULFATE 325 (65 FE) MG TABLET    Take 1 tablet (325 mg total) by mouth 2 (two) times daily with a meal.   FLUTICASONE (FLONASE) 50 MCG/ACT NASAL SPRAY    Place 1 spray into both nostrils 2 (two) times daily.   FLUTICASONE-SALMETEROL (ADVAIR DISKUS) 100-50 MCG/DOSE AEPB    Inhale 1 puff into the lungs 2 (two) times daily.   FOLIC ACID (FOLVITE) 1 MG TABLET    Take 1 tablet (1 mg total) by mouth daily.   LORAZEPAM (ATIVAN) 1 MG TABLET    Take 1 tablet (1 mg total) by mouth every 8 (eight) hours as needed for anxiety.   MAGNESIUM OXIDE (MAG-OX) 400 (241.3 MG) MG TABLET    Take 1 tablet (400 mg total) by mouth daily.   MULTIPLE VITAMINS-MINERALS (MULTIVITAMIN WITH MINERALS) TABLET    Take 1 tablet by mouth daily.   PANTOPRAZOLE (PROTONIX) 40 MG TABLET    Take 1 tablet (40 mg total) by mouth 2 (two) times daily before a meal.   TAMSULOSIN (FLOMAX) 0.4 MG CAPS CAPSULE    Take 1 capsule (0.4  mg total) by mouth daily.   THIAMINE (VITAMIN B-1) 100 MG TABLET    Take 1 tablet (100 mg total) by mouth daily.   TRAZODONE (DESYREL) 50 MG TABLET    Take 1 tablet (50 mg total) by mouth at bedtime as needed for sleep.   VENTOLIN HFA 108 (90 BASE) MCG/ACT INHALER    INHALE 2 PUFFS EVERY 4 HOURS AS NEEDED    Review of Systems  Constitutional: Negative for appetite change and fatigue.  Respiratory: Positive for shortness of breath and wheezing.   Cardiovascular: Negative.   Musculoskeletal: Negative.   Skin: Negative.   Neurological: Negative.   Psychiatric/Behavioral: Positive for decreased concentration and sleep disturbance. The patient is nervous/anxious.     Social History   Tobacco Use  . Smoking status: Former Research scientist (life sciences)  . Smokeless tobacco: Never Used  . Tobacco comment: quit 30 years ago  Substance Use Topics  . Alcohol use: Not Currently     Alcohol/week: 7.0 standard drinks    Types: 7 Glasses of wine per week    Comment: Pt states, "I quit all the hard stuff 4 months ago"   Objective:   BP (!) 151/88 (BP Location: Left Arm, Patient Position: Sitting)   Temp 98 F (36.7 C) (Oral)   Ht 5' 11.25" (1.81 m)   Wt 188 lb 3.2 oz (85.4 kg)   BMI 26.06 kg/m   Physical Exam  Constitutional: He is oriented to person, place, and time. He appears well-developed and well-nourished.  Eyes: Pupils are equal, round, and reactive to light. EOM are normal.  Neck: Normal range of motion. Neck supple. No thyromegaly present.  Cardiovascular: Normal rate, regular rhythm, normal heart sounds and intact distal pulses.  Pulmonary/Chest: Effort normal and breath sounds normal.  Abdominal: Soft. Bowel sounds are normal.  Musculoskeletal: Normal range of motion.  Neurological: He is alert and oriented to person, place, and time.  Skin: Skin is warm and dry. Capillary refill takes less than 2 seconds.  Psychiatric:  Anxious  Nursing note and vitals reviewed.     Assessment & Plan:  1. Essential hypertension Poorly controlled due to nonadherence to medications.  I have reiterated the need for medication adherence with patient.  He is to continue clonidine 0.1 mg twice a day and to monitor and log his blood pressure daily.  We will review blood pressure in 1 month.  2. Mild intermittent asthma without complication Well-controlled with Advair and albuterol  3. Substance induced mood disorder (HCC) Persistent anxiety.  Patient encouraged to start Celexa 10 mg at bedtime and doing a call and notify clinic of any suicidal ideation or worsening symptoms.  4. Gout, unspecified cause, unspecified chronicity, unspecified site Noncompliant with prophylactic medications.  Encourage patient to take allopurinol 300 mg daily  5. Alcohol use disorder, severe, dependence ([place]) I doubt if patient is actually in remission.  I have reviewed the need for  continuous alcohol abstinence with him and reviewed the signs and symptoms of acute withdrawal while and encouraged him to go to the emergency room if he feels that he is in acute withdrawal.  Continue thiamine folic acid and multivitamin as well as magnesium oxide daily.  Will obtain repeat electrolyte levels during his next visit.  6. Gastroesophageal reflux disease without esophagitis Currently asymptomatic.  Continue Protonix 40 mg twice a day.  Will start tapering dose at his next visit if he continues to be asymptomatic.  7. Obsessive-compulsive disorder, unspecified type Patient will benefit from a  psychiatric evaluation.  Will refer [facility] at [facility], NP   [facility] [place]

## 2018-08-03 ENCOUNTER — Other Ambulatory Visit: Payer: Self-pay

## 2018-08-03 ENCOUNTER — Emergency Department: Payer: Self-pay

## 2018-08-03 ENCOUNTER — Emergency Department
Admission: EM | Admit: 2018-08-03 | Discharge: 2018-08-04 | Disposition: A | Discharge status: Home / Self Care | Payer: Self-pay | Attending: Emergency Medicine | Admitting: Emergency Medicine

## 2018-08-03 DIAGNOSIS — Z79899 Other long term (current) drug therapy: Secondary | ICD-10-CM | POA: Insufficient documentation

## 2018-08-03 DIAGNOSIS — J45909 Unspecified asthma, uncomplicated: Secondary | ICD-10-CM | POA: Insufficient documentation

## 2018-08-03 DIAGNOSIS — R1030 Lower abdominal pain, unspecified: Secondary | ICD-10-CM | POA: Insufficient documentation

## 2018-08-03 DIAGNOSIS — F10920 Alcohol use, unspecified with intoxication, uncomplicated: Secondary | ICD-10-CM | POA: Insufficient documentation

## 2018-08-03 DIAGNOSIS — N390 Urinary tract infection, site not specified: Secondary | ICD-10-CM | POA: Insufficient documentation

## 2018-08-03 DIAGNOSIS — I10 Essential (primary) hypertension: Secondary | ICD-10-CM | POA: Insufficient documentation

## 2018-08-03 LAB — URINALYSIS, COMPLETE (UACMP) WITH MICROSCOPIC
Bacteria, UA: NONE SEEN
Bilirubin Urine: NEGATIVE
Glucose, UA: NEGATIVE mg/dL
Hgb urine dipstick: NEGATIVE
Ketones, ur: NEGATIVE mg/dL
Nitrite: NEGATIVE
Protein, ur: NEGATIVE mg/dL
Specific Gravity, Urine: 1.009 (ref 1.005–1.030)
pH: 5 (ref 5.0–8.0)

## 2018-08-03 LAB — COMPREHENSIVE METABOLIC PANEL
ALT: 12 U/L (ref 0–44)
AST: 15 U/L (ref 15–41)
Albumin: 4.2 g/dL (ref 3.5–5.0)
Alkaline Phosphatase: 69 U/L (ref 38–126)
Anion gap: 14 (ref 5–15)
BUN: 20 mg/dL (ref 6–20)
CO2: 22 mmol/L (ref 22–32)
Calcium: 8.8 mg/dL — ABNORMAL LOW (ref 8.9–10.3)
Chloride: 106 mmol/L (ref 98–111)
Creatinine, Ser: 1.28 mg/dL — ABNORMAL HIGH (ref 0.61–1.24)
GFR calc Af Amer: >60 mL/min (ref >60)
GFR calc non Af Amer: >60 mL/min (ref >60)
Glucose, Bld: 145 mg/dL — ABNORMAL HIGH (ref 70–99)
Potassium: 3.6 mmol/L (ref 3.5–5.1)
Sodium: 142 mmol/L (ref 135–145)
Total Bilirubin: 0.5 mg/dL (ref 0.3–1.2)
Total Protein: 7.1 g/dL (ref 6.5–8.1)

## 2018-08-03 LAB — CBC WITH DIFFERENTIAL/PLATELET
Basophils Absolute: 0.1 10*3/uL (ref 0–0.1)
Basophils Relative: 1 %
Eosinophils Absolute: 0 10*3/uL (ref 0–0.7)
Eosinophils Relative: 1 %
HCT: 35.6 % — ABNORMAL LOW (ref 40.0–52.0)
Hemoglobin: 11.9 g/dL — ABNORMAL LOW (ref 13.0–18.0)
Lymphocytes Relative: 38 %
Lymphs Abs: 2.7 10*3/uL (ref 1.0–3.6)
MCH: 25 pg — ABNORMAL LOW (ref 26.0–34.0)
MCHC: 33.4 g/dL (ref 32.0–36.0)
MCV: 74.9 fL — ABNORMAL LOW (ref 80.0–100.0)
Monocytes Absolute: 0.7 10*3/uL (ref 0.2–1.0)
Monocytes Relative: 10 %
Neutro Abs: 3.6 10*3/uL (ref 1.4–6.5)
Neutrophils Relative %: 50 %
Platelets: 224 10*3/uL (ref 150–440)
RBC: 4.76 MIL/uL (ref 4.40–5.90)
RDW: 20.4 % — ABNORMAL HIGH (ref 11.5–14.5)
WBC: 7.2 10*3/uL (ref 3.8–10.6)

## 2018-08-03 LAB — ETHANOL: Alcohol, Ethyl (B): 324 mg/dL (ref <10)

## 2018-08-03 LAB — LIPASE, BLOOD: Lipase: 35 U/L (ref 11–51)

## 2018-08-03 MED ORDER — CEPHALEXIN 500 MG PO CAPS: 500.0000 mg | ORAL_CAPSULE | Freq: Two times a day (BID) | ORAL | 0 refills | Status: AC

## 2018-08-03 MED ORDER — ONDANSETRON HCL 4 MG/2ML IJ SOLN
4.0000 mg | Freq: Once | INTRAMUSCULAR | Status: AC
  Administered 2018-08-03: 4 mg via INTRAVENOUS
  Filled 2018-08-03: qty 2

## 2018-08-03 MED ORDER — SODIUM CHLORIDE 0.9 % IV BOLUS
1000.0000 mL | Freq: Once | INTRAVENOUS | Status: AC
  Administered 2018-08-03: 1000 mL via INTRAVENOUS

## 2018-08-03 MED ORDER — CEPHALEXIN 500 MG PO CAPS
500.0000 mg | ORAL_CAPSULE | Freq: Once | ORAL | Status: AC
  Administered 2018-08-04: 500 mg via ORAL
  Filled 2018-08-03: qty 1

## 2018-08-03 NOTE — ED Notes (Signed)
Date and time results received: 08/03/18 2315 (use smartphrase ".now" to insert current time)  Test: ETOH Critical Value: 324  Name of Provider Notified: Dr. Lucita Lora  Orders Received? Or Actions Taken?:

## 2018-08-03 NOTE — ED Triage Notes (Signed)
Pt arrived via EMS c/o lt sided abd pain and rectal bleeding x3 days. Hx of kidney stones.

## 2018-08-03 NOTE — ED Provider Notes (Signed)
[facility] Emergency Department Provider Note ____________________________________________   First MD Initiated Contact with Patient 08/03/18 2213     (approximate)  I have reviewed the triage vital signs and the nursing notes.   HISTORY  Chief Complaint Abdominal Pain  HPI Miguel [name] is a 57 y.o. male with history of alcohol abuse as well as hypertension and renal colic was presented to the emergency department lower abdominal pain over the past 3 days.  He reports that he has been having pure blood from his rectum over the past 3 days whenever he moves his bowels.  He says the pain is lower and severe and constant.    Past Medical History:  Diagnosis Date  . Alcohol abuse   . Asthma   . GERD (gastroesophageal reflux disease)   . Gout   . Hypertension   . OCD (obsessive compulsive disorder)   . Renal colic     Patient Active Problem List   Diagnosis Date Noted  . Asthma 07/07/2018  . GERD (gastroesophageal reflux disease) 07/07/2018  . OCD (obsessive compulsive disorder) 07/07/2018  . Self-inflicted laceration of wrist 03/10/2018  . Leg hematoma 12/25/2017  . Suicide and self-inflicted injury by cutting and piercing instrument ([place]) 03/10/2017  . Severe recurrent major 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 ([place]) 03/09/2017  . Substance induced mood disorder ([place]) 08/15/2016  . Involuntary commitment 08/15/2016  . Alcohol use disorder, severe, dependence ([place]) 02/05/2016  . Hypertension 12/05/2015  . Tachycardia 12/05/2015  . Gout 11/13/2015  . Chronic back pain 05/02/2015    Past Surgical History:  Procedure Laterality Date  . CHOLECYSTECTOMY  2012    Prior to Admission medications   Medication Sig Start Date End Date Taking? Authorizing Provider  albuterol (VENTOLIN HFA) 108 (90 Base) MCG/ACT inhaler Inhale 2 puffs into the lungs every 4 (four) hours as needed. 07/07/18   Tukov-Yual, Arlyss Gandy, NP  allopurinol (ZYLOPRIM) 300 MG  tablet Take 1 tablet (300 mg total) by mouth daily. 07/07/18   Tukov-Yual, Arlyss Gandy, NP  citalopram (CELEXA) 10 MG tablet Take 1 tablet (10 mg total) by mouth at bedtime. 07/07/18   Tukov-Yual, Arlyss Gandy, NP  cloNIDine (CATAPRES) 0.1 MG tablet Take 1 tablet (0.1 mg total) by mouth 2 (two) times daily. 07/07/18   Tukov-Yual, Arlyss Gandy, NP  diphenhydrAMINE (BENADRYL) 50 MG tablet Take 1 tablet (50 mg total) by mouth at bedtime as needed for itching. Patient not taking: Reported on 07/07/2018 05/12/18   Erlene Quan, NP  ferrous sulfate 325 (65 FE) MG tablet Take 1 tablet (325 mg total) by mouth 2 (two) times daily with a meal. 07/07/18   Tukov-Yual, Magdalene S, NP  Fluticasone-Salmeterol (ADVAIR DISKUS) 100-50 MCG/DOSE AEPB Inhale 1 puff into the lungs 2 (two) times daily. 07/07/18   Tukov-Yual, Arlyss Gandy, NP  folic acid (FOLVITE) 1 MG tablet Take 1 tablet (1 mg total) by mouth daily. 07/07/18   Tukov-Yual, Arlyss Gandy, NP  magnesium oxide (MAG-OX) 400 (241.3 Mg) MG tablet Take 1 tablet (400 mg total) by mouth daily. 07/07/18   Tukov-Yual, Arlyss Gandy, NP  Multiple Vitamins-Minerals (MULTIVITAMIN WITH MINERALS) tablet Take 1 tablet by mouth daily. 07/07/18   Tukov-Yual, Arlyss Gandy, NP  pantoprazole (PROTONIX) 40 MG tablet Take 1 tablet (40 mg total) by mouth 2 (two) times daily before a meal. 07/07/18   Tukov-Yual, Arlyss Gandy, NP  thiamine (VITAMIN B-1) 100 MG tablet Take 1 tablet (100 mg total) by mouth daily. 07/07/18  Erlene Quan, NP    Allergies Patient has no known allergies.  Family History  Problem Relation Age of Onset  . Alcohol abuse Father     Social History Social History   Tobacco Use  . Smoking status: Former Research scientist (life sciences)  . Smokeless tobacco: Never Used  . Tobacco comment: quit 30 years ago  Substance Use Topics  . Alcohol use: Not Currently    Alcohol/week: 7.0 standard drinks    Types: 7 Glasses of wine per week    Comment: Pt states, "I quit all the  hard stuff 4 months ago"  . Drug use: No    Review of Systems  Constitutional: No fever/chills Eyes: No visual changes. ENT: No sore throat. Cardiovascular: Denies chest pain. Respiratory: Denies shortness of breath. Gastrointestinal: No nausea, no vomiting.   Genitourinary: Negative for dysuria. Musculoskeletal: Negative for back pain. Skin: Negative for rash. Neurological: Negative for headaches, focal weakness or numbness.   ____________________________________________   PHYSICAL EXAM:  VITAL SIGNS: ED Triage Vitals [08/03/18 2215]  Enc Vitals Group     BP (!) 182/111     Pulse Rate (!) 108     Resp 18     Temp 98.1 F (36.7 C)     Temp Source Oral     SpO2 97 %     Weight 180 lb (81.6 kg)     Height 6' (1.829 m)     Head Circumference      Peak Flow      Pain Score 8     Pain Loc      Pain Edu?      Excl. in [place]?     Constitutional: Alert and oriented.  Patient without affect and seems to be intoxicated. Eyes: Conjunctivae are normal.  Head: Atraumatic. Nose: No congestion/rhinnorhea. Mouth/Throat: Mucous membranes are moist.  Neck: No stridor.   Cardiovascular: Normal rate, regular rhythm. Grossly normal heart sounds.  Respiratory: Normal respiratory effort.  No retractions. Lungs CTAB. Gastrointestinal: Soft and nontender. No distention.  Rectal exam with grossly brown stool that is heme-negative. Musculoskeletal: No lower extremity tenderness nor edema.  No joint effusions. Neurologic:  Normal speech and language. No gross focal neurologic deficits are appreciated. Skin:  Skin is warm, dry and intact. No rash noted. Psychiatric: Mood and affect are normal. Speech and behavior are normal.  ____________________________________________   LABS (all labs ordered are listed, but only abnormal results are displayed)  Labs Reviewed  CBC WITH DIFFERENTIAL/PLATELET - Abnormal; Notable for the following components:      Result Value   Hemoglobin 11.9 (*)      HCT 35.6 (*)    MCV 74.9 (*)    MCH 25.0 (*)    RDW 20.4 (*)    All other components within normal limits  COMPREHENSIVE METABOLIC PANEL - Abnormal; Notable for the following components:   Glucose, Bld 145 (*)    Creatinine, Ser 1.28 (*)    Calcium 8.8 (*)    All other components within normal limits  URINALYSIS, COMPLETE (UACMP) WITH MICROSCOPIC - Abnormal; Notable for the following components:   Color, Urine YELLOW (*)    APPearance HAZY (*)    Leukocytes, UA SMALL (*)    All other components within normal limits  ETHANOL - Abnormal; Notable for the following components:   Alcohol, Ethyl (B) 324 (*)    All other components within normal limits  LIPASE, BLOOD   ____________________________________________  EKG   ____________________________________________  RADIOLOGY  Pending  renal stone study. ____________________________________________   PROCEDURES  Procedure(s) performed:   Procedures  Critical Care performed:   ____________________________________________   INITIAL IMPRESSION / ASSESSMENT AND PLAN / ED COURSE  Pertinent labs & imaging results that were available during my care of the patient were reviewed by me and considered in my medical decision making (see chart for details).  Differential diagnosis includes, but is not limited to, acute appendicitis, renal colic, testicular torsion, urinary tract infection/pyelonephritis, prostatitis,  epididymitis, diverticulitis, small bowel obstruction or ileus, colitis, abdominal aortic aneurysm, gastroenteritis, hernia, etc. As part of my medical decision making, I reviewed the following data within the electronic MEDICAL RECORD NUMBER Notes from prior ED visits  ----------------------------------------- 11:51 PM on 08/03/2018 -----------------------------------------  Patient pending renal stone study at this time.  Possible UTI with WBCs and clumps.  Patient will require observation to achieve sobriety.  Will  also require UTI treatment.  Possible cause of the abdominal pain.  Heme-negative on rectal exam.  Signed out to Dr. Mable Paris. ____________________________________________   FINAL CLINICAL IMPRESSION(S) / ED DIAGNOSES  Abdominal pain.  Alcohol intoxication.  NEW MEDICATIONS STARTED DURING THIS VISIT:  New Prescriptions   No medications on file     Note:  This document was prepared using Dragon voice recognition software and may include unintentional dictation errors.     Orbie Pyo, MD 08/03/18 2352

## 2018-08-03 NOTE — ED Notes (Signed)
Made MD aware of pt pain level.

## 2018-08-04 MED ORDER — CHLORDIAZEPOXIDE HCL 25 MG PO CAPS
100.0000 mg | ORAL_CAPSULE | Freq: Once | ORAL | Status: AC
  Administered 2018-08-04: 100 mg via ORAL
  Filled 2018-08-04: qty 4

## 2018-08-04 NOTE — Discharge Instructions (Signed)
Please make an appointment to follow-up with the gastroenterologist for reevaluation.  Return to the emergency department sooner for any concerns whatsoever.  It was a pleasure to take care of you today, and thank you for coming to our emergency department.  If you have any questions or concerns before leaving please ask the nurse to grab me and I'm more than happy to go through your aftercare instructions again.  If you were prescribed any opioid pain medication today such as Norco, Vicodin, Percocet, morphine, hydrocodone, or oxycodone please make sure you do not drive when you are taking this medication as it can alter your ability to drive safely.  If you have any concerns once you are home that you are not improving or are in fact getting worse before you can make it to your follow-up appointment, please do not hesitate to call [phone] and come back for further evaluation.  Darel Hong, MD  Results for orders placed or performed during the hospital encounter of 08/03/18  CBC with Differential  Result Value Ref Range   WBC 7.2 3.8 - 10.6 K/uL   RBC 4.76 4.40 - 5.90 MIL/uL   Hemoglobin 11.9 (L) 13.0 - 18.0 g/dL   HCT 35.6 (L) 40.0 - 52.0 %   MCV 74.9 (L) 80.0 - 100.0 fL   MCH 25.0 (L) 26.0 - 34.0 pg   MCHC 33.4 32.0 - 36.0 g/dL   RDW 20.4 (H) 11.5 - 14.5 %   Platelets 224 150 - 440 K/uL   Neutrophils Relative % 50 %   Neutro Abs 3.6 1.4 - 6.5 K/uL   Lymphocytes Relative 38 %   Lymphs Abs 2.7 1.0 - 3.6 K/uL   Monocytes Relative 10 %   Monocytes Absolute 0.7 0.2 - 1.0 K/uL   Eosinophils Relative 1 %   Eosinophils Absolute 0.0 0 - 0.7 K/uL   Basophils Relative 1 %   Basophils Absolute 0.1 0 - 0.1 K/uL  Comprehensive metabolic panel  Result Value Ref Range   Sodium 142 135 - 145 mmol/L   Potassium 3.6 3.5 - 5.1 mmol/L   Chloride 106 98 - 111 mmol/L   CO2 22 22 - 32 mmol/L   Glucose, Bld 145 (H) 70 - 99 mg/dL   BUN 20 6 - 20 mg/dL   Creatinine, Ser 1.28 (H) 0.61 - 1.24 mg/dL   Calcium 8.8 (L) 8.9 - 10.3 mg/dL   Total Protein 7.1 6.5 - 8.1 g/dL   Albumin 4.2 3.5 - 5.0 g/dL   AST 15 15 - 41 U/L   ALT 12 0 - 44 U/L   Alkaline Phosphatase 69 38 - 126 U/L   Total Bilirubin 0.5 0.3 - 1.2 mg/dL   GFR calc non Af Amer >60 >60 mL/min   GFR calc Af Amer >60 >60 mL/min   Anion gap 14 5 - 15  Lipase, blood  Result Value Ref Range   Lipase 35 11 - 51 U/L  Urinalysis, Complete w Microscopic  Result Value Ref Range   Color, Urine YELLOW (A) YELLOW   APPearance HAZY (A) CLEAR   Specific Gravity, Urine 1.009 1.005 - 1.030   pH 5.0 5.0 - 8.0   Glucose, UA NEGATIVE NEGATIVE mg/dL   Hgb urine dipstick NEGATIVE NEGATIVE   Bilirubin Urine NEGATIVE NEGATIVE   Ketones, ur NEGATIVE NEGATIVE mg/dL   Protein, ur NEGATIVE NEGATIVE mg/dL   Nitrite NEGATIVE NEGATIVE   Leukocytes, UA SMALL (A) NEGATIVE   RBC / HPF 0-5 0 - 5  RBC/hpf   WBC, UA 21-50 0 - 5 WBC/hpf   Bacteria, UA NONE SEEN NONE SEEN   Squamous Epithelial / LPF 0-5 0 - 5   WBC Clumps PRESENT    Mucus PRESENT    Hyaline Casts, UA PRESENT   Ethanol  Result Value Ref Range   Alcohol, Ethyl (B) 324 (HH) <10 mg/dL   Ct Renal Stone Study  Result Date: 08/03/2018 CLINICAL DATA:  Left-sided abdominal pain radiating to the left flank and with rectal bleeding x3 days. History of renal stones. EXAM: CT ABDOMEN AND PELVIS WITHOUT CONTRAST TECHNIQUE: Multidetector CT imaging of the abdomen and pelvis was performed following the standard protocol without IV contrast. COMPARISON:  06/30/2018 FINDINGS: Lower chest: Top-normal size heart. Minimal atelectasis at the lung bases. No acute abnormality noted. Hepatobiliary: Cholecystectomy. Homogeneous appearance of the liver without space-occupying mass nor biliary dilatation given limitations of a noncontrast study. Pancreas: Normal without ductal dilatation or inflammation. Spleen: Normal size spleen. Adrenals/Urinary Tract: Bilateral adrenal glands are normal. Bilateral nephrolithiasis  without hydronephrosis nor hydroureter redemonstrated. The urinary bladder is physiologically distended without focal mural thickening or calculi. Stomach/Bowel: Moderate diffuse colonic spasm of the right and transverse colon. Submucosal fat deposition may represent stigmata of chronic inflammatory bowel. No acute bowel inflammation or obstruction. Scattered colonic diverticulosis without acute diverticulitis is identified along the sigmoid colon. No bowel obstruction. The stomach and small intestine are unremarkable. No evidence of acute appendicitis. The appendix is visualized and normal in caliber. Suggestion of mild rectal prolapse, series 5/99. Vascular/Lymphatic: No significant vascular findings are present. No enlarged abdominal or pelvic lymph nodes. Reproductive: Mild prostatic enlargement. Other: No free air nor free fluid. Musculoskeletal: No acute nor significant osseous findings. IMPRESSION: 1. There is suggestion mild rectal prolapse, series 5/99. Correlate clinically. 2. Submucosal fat deposition of the ascending and transverse colon likely representing stigmata of inflammatory bowel. No active inflammatory process identified. Scattered colonic diverticulosis of the sigmoid without acute diverticulitis. 3. Bilateral nephrolithiasis without obstructive uropathy. Electronically Signed   By: Ashley Royalty M.D.   On: 08/03/2018 23:50

## 2018-08-04 NOTE — ED Provider Notes (Signed)
The patient is beginning to become mildly tremulous and is beginning to withdrawal from alcohol.  His CT scan shows sequela of prior inflammatory bowel disease however he is able to eat and drink now and his pain is adequately controlled.  He does not want to stop drinking alcohol yet.  I will refer him to gastroenterology as an outpatient.  He is medically stable for outpatient management.  I appreciate his elevated ethanol level however he is a chronic alcoholic and is currently conversant and is clinically sober.  As he has no desire to stop drinking alcohol keeping him any longer to detox for his or would actually cause significant harm and put him at risk for [place].   Darel Hong, MD 08/04/18 [phone]

## 2018-08-05 LAB — URINE CULTURE: Culture: NO GROWTH

## 2018-08-11 ENCOUNTER — Encounter: Payer: Self-pay | Admitting: *Deleted

## 2018-08-11 ENCOUNTER — Other Ambulatory Visit: Payer: Self-pay

## 2018-08-11 ENCOUNTER — Emergency Department
Admission: EM | Admit: 2018-08-11 | Discharge: 2018-08-11 | Disposition: A | Discharge status: Home / Self Care | Payer: Medicaid Other | Attending: Emergency Medicine | Admitting: Emergency Medicine

## 2018-08-11 DIAGNOSIS — Y9301 Activity, walking, marching and hiking: Secondary | ICD-10-CM | POA: Insufficient documentation

## 2018-08-11 DIAGNOSIS — Y999 Unspecified external cause status: Secondary | ICD-10-CM | POA: Insufficient documentation

## 2018-08-11 DIAGNOSIS — Y92008 Other place in unspecified non-institutional (private) residence as the place of occurrence of the external cause: Secondary | ICD-10-CM | POA: Insufficient documentation

## 2018-08-11 DIAGNOSIS — W01198A Fall on same level from slipping, tripping and stumbling with subsequent striking against other object, initial encounter: Secondary | ICD-10-CM | POA: Insufficient documentation

## 2018-08-11 DIAGNOSIS — Z5321 Procedure and treatment not carried out due to patient leaving prior to being seen by health care provider: Secondary | ICD-10-CM | POA: Insufficient documentation

## 2018-08-11 DIAGNOSIS — S30811A Abrasion of abdominal wall, initial encounter: Secondary | ICD-10-CM | POA: Insufficient documentation

## 2018-08-11 LAB — COMPREHENSIVE METABOLIC PANEL
ALT: 11 U/L (ref 0–44)
AST: 12 U/L — ABNORMAL LOW (ref 15–41)
Albumin: 4.1 g/dL (ref 3.5–5.0)
Alkaline Phosphatase: 56 U/L (ref 38–126)
Anion gap: 8 (ref 5–15)
BUN: 13 mg/dL (ref 6–20)
CO2: 26 mmol/L (ref 22–32)
Calcium: 9.2 mg/dL (ref 8.9–10.3)
Chloride: 109 mmol/L (ref 98–111)
Creatinine, Ser: 0.98 mg/dL (ref 0.61–1.24)
GFR calc Af Amer: >60 mL/min (ref >60)
GFR calc non Af Amer: >60 mL/min (ref >60)
Glucose, Bld: 130 mg/dL — ABNORMAL HIGH (ref 70–99)
Potassium: 3.9 mmol/L (ref 3.5–5.1)
Sodium: 143 mmol/L (ref 135–145)
Total Bilirubin: 0.5 mg/dL (ref 0.3–1.2)
Total Protein: 6.7 g/dL (ref 6.5–8.1)

## 2018-08-11 LAB — CBC
HCT: 35.6 % — ABNORMAL LOW (ref 40.0–52.0)
Hemoglobin: 11.6 g/dL — ABNORMAL LOW (ref 13.0–18.0)
MCH: 25.4 pg — ABNORMAL LOW (ref 26.0–34.0)
MCHC: 32.7 g/dL (ref 32.0–36.0)
MCV: 77.7 fL — ABNORMAL LOW (ref 80.0–100.0)
Platelets: 200 10*3/uL (ref 150–440)
RBC: 4.58 MIL/uL (ref 4.40–5.90)
RDW: 21.4 % — ABNORMAL HIGH (ref 11.5–14.5)
WBC: 6.9 10*3/uL (ref 3.8–10.6)

## 2018-08-11 LAB — LIPASE, BLOOD: Lipase: 30 U/L (ref 11–51)

## 2018-08-11 NOTE — ED Triage Notes (Addendum)
Pt brought in via ems from home with abd pain.  No n/v/d.  pt reports falling on the ramp at his house today and pt has an abrasion to upper abdomen.   Pt seen here recently for similar sx.  Pt alert.

## 2018-08-11 NOTE — ED Notes (Signed)
Pt unable to void at this time. 

## 2018-08-11 NOTE — ED Notes (Signed)
First RN note:  Pt comes in via [facility] from home c/o fall onto the railing of his porch.  Denies any LOC.  Patient in NAD at this time with even and unlabored respirations.  Patient is holding his stomach at this time.

## 2018-08-11 NOTE — ED Notes (Signed)
Patient states his wife fell at home and he would like to go home.  Patient advised to stay for MD evaluation, but he declined at this time. `

## 2018-08-14 IMAGING — CT CT RENAL STONE PROTOCOL
2 of 4 series · 16 of 46 positions shown, 18 images · non-contrast
Comparison: 08/09/2017

CLINICAL DATA: Low back pain with hematuria

EXAM:
CT ABDOMEN AND PELVIS WITHOUT CONTRAST
TECHNIQUE: Multidetector CT imaging of the abdomen and pelvis was performed
following the standard protocol without IV contrast.

[Series 2: stone full standard · axial · 0.75mm/px · z∈[-540,-110]mm · 13 of 96 slices shown, 15 images]
[im 5/96  soft-tissue]
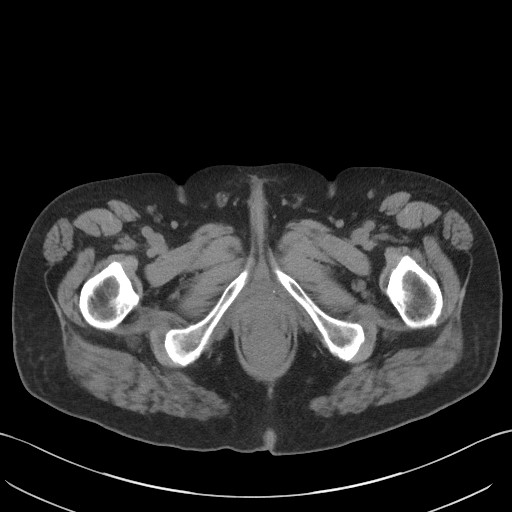
[im 5/96  bone]
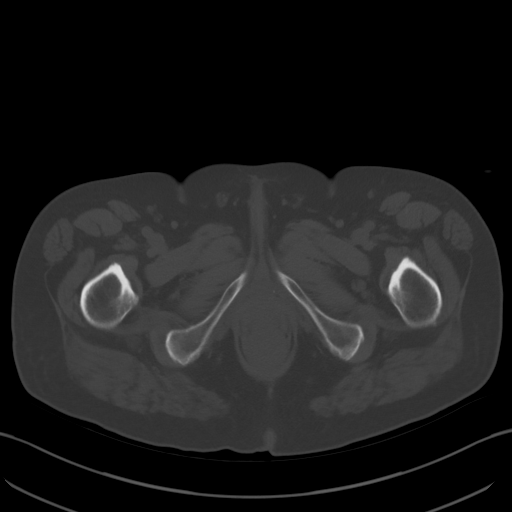
[im 13/96  soft-tissue]
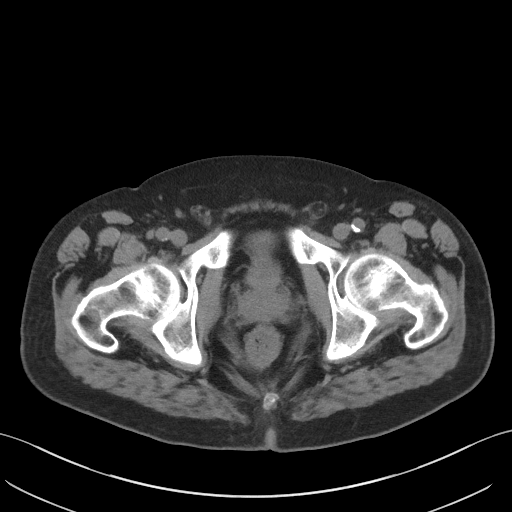
[im 21/96  soft-tissue]
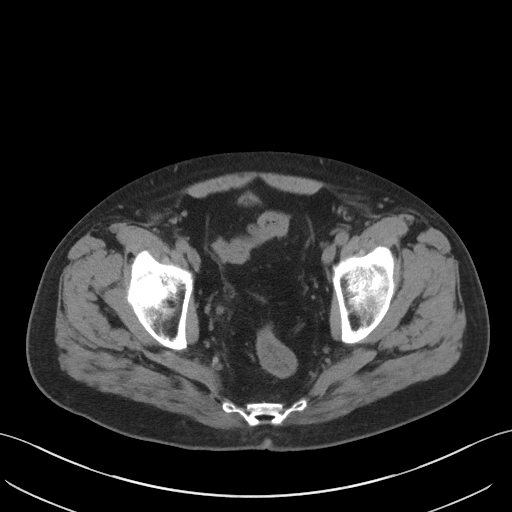
[im 25/96  soft-tissue]
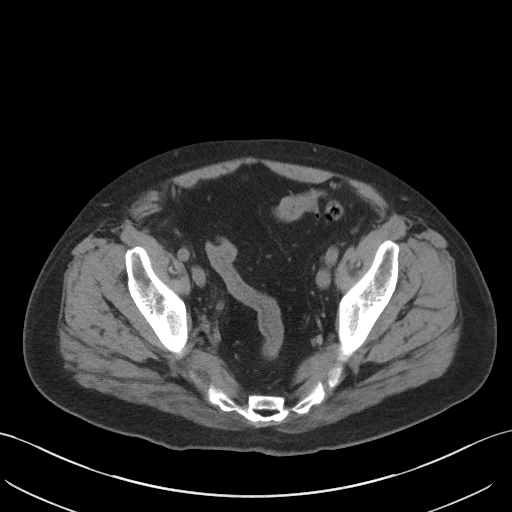
[im 34/96  soft-tissue]
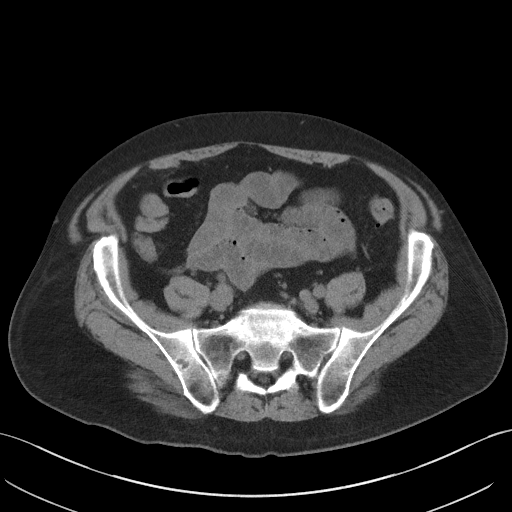
[im 42/96  soft-tissue]
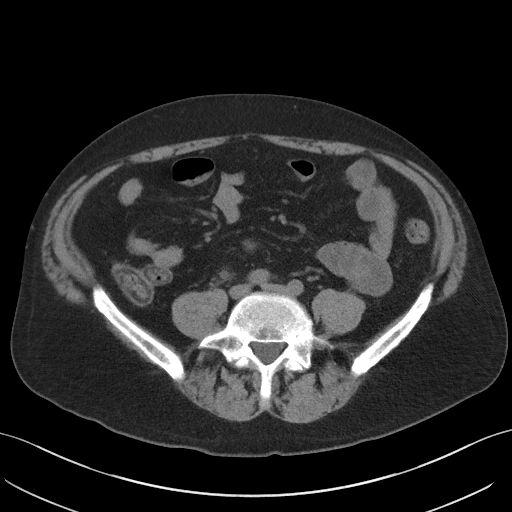
[im 50/96  soft-tissue]
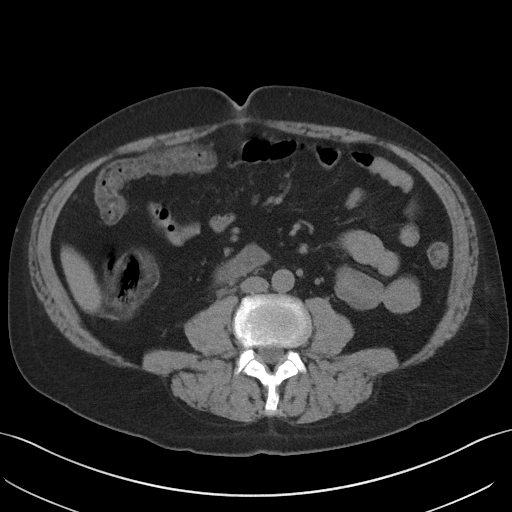
[im 54/96  soft-tissue]
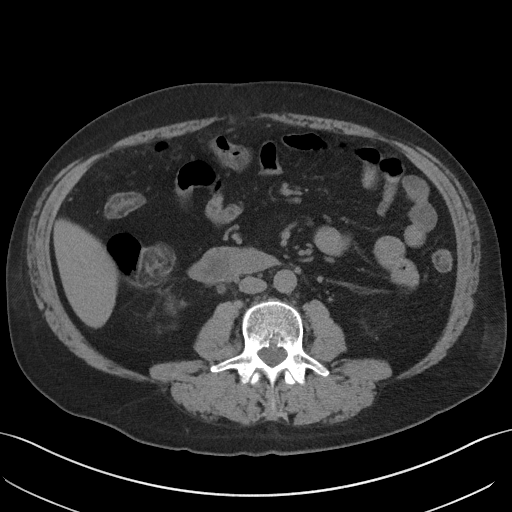
[im 62/96  soft-tissue]
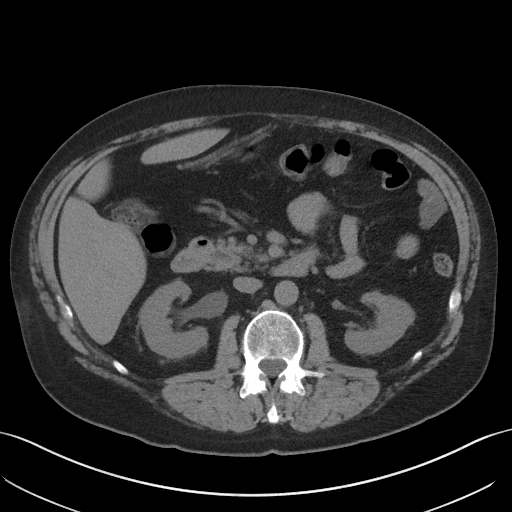
[im 62/96  bone]
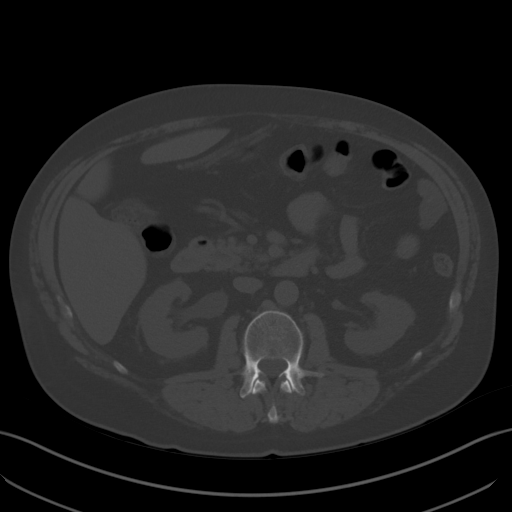
[im 71/96  soft-tissue]
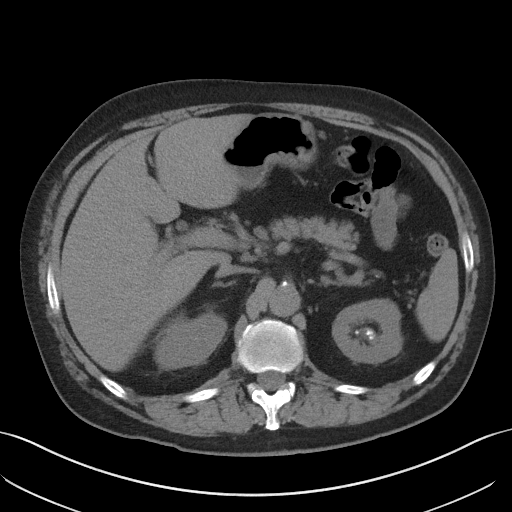
[im 75/96  soft-tissue]
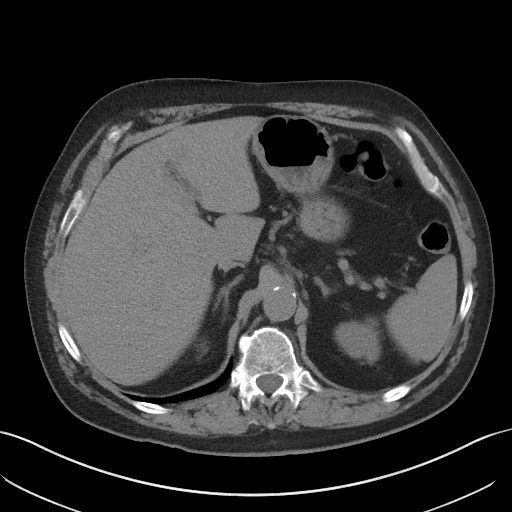
[im 83/96  soft-tissue]
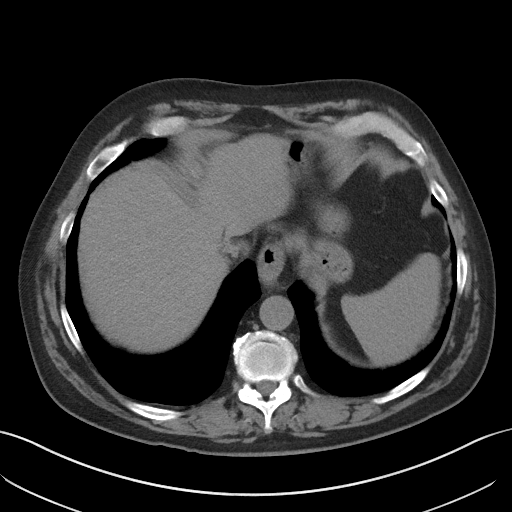
[im 91/96  soft-tissue]
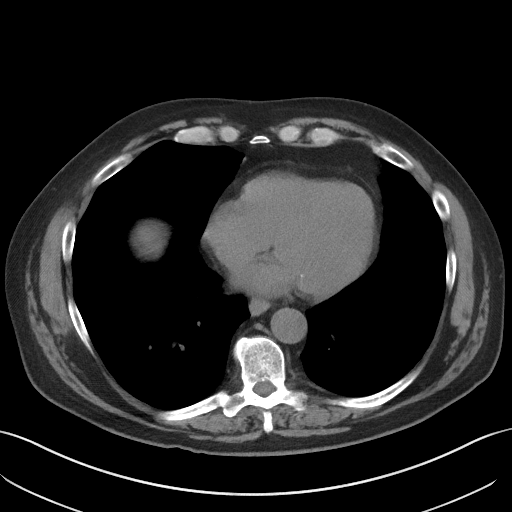

[Series 5: coronal · coronal · 0.74mm/px · 3 of 146 slices shown]
[im 49/146  soft-tissue]
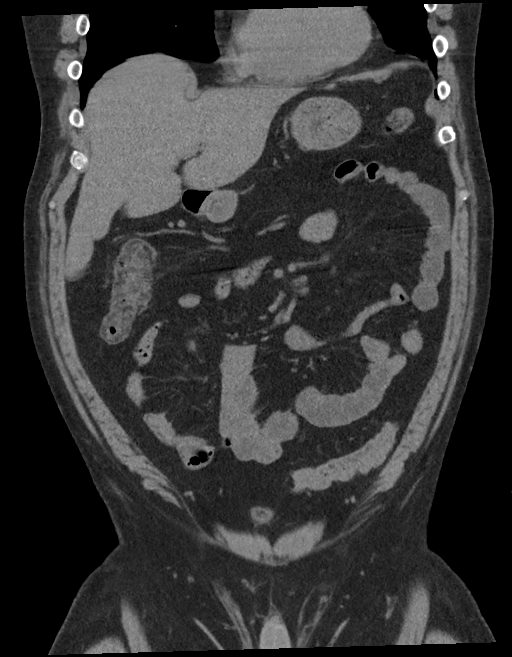
[im 65/146  soft-tissue]
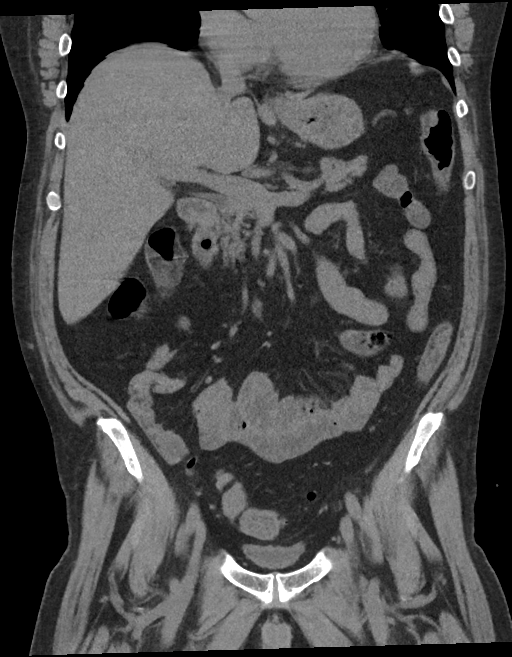
[im 81/146  soft-tissue]
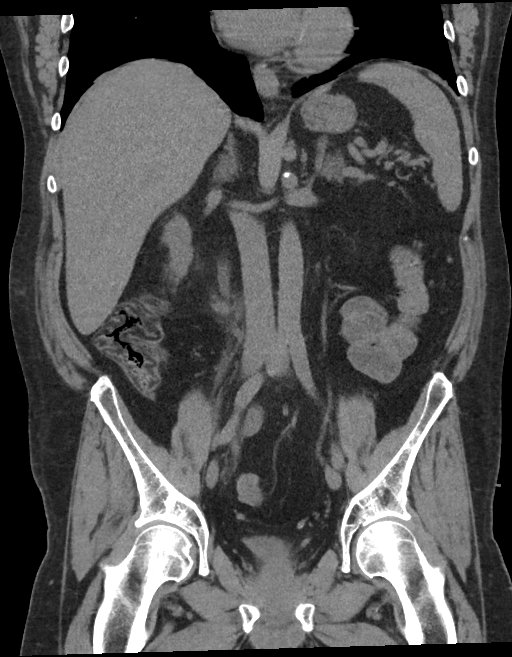

[16 of 46 positions shown; findings below may reference images not displayed]

FINDINGS: Lower chest: Lingular atelectatic changes are noted.

Hepatobiliary: No focal liver abnormality is seen. Status post
cholecystectomy. No biliary dilatation.

Pancreas: Unremarkable. No pancreatic ductal dilatation or
surrounding inflammatory changes.

Spleen: Normal in size without focal abnormality.

Adrenals/Urinary Tract: The adrenal glands are within normal limits.
Nonobstructing left renal stones are again identified and stable.
The left ureter is within normal limits. The bladder is
decompressed. The right kidney also demonstrates nonobstructing
renal stones. Mild hydronephrosis is noted on the right extending
into the right ureter to the level of the distal ureter just above
the ureterovesical junction. 5 mm obstructing stone is noted in the
distal right ureter. This is best visualized on image number 89 of
series 5 and image number 78 of series 2.

Stomach/Bowel: The appendix is within normal limits. The colon is
aga[REDACTED]ompressed with a similar appearance to that noted on the
prior exam likely within normal limits given the long-term
stability. No obstructive changes are seen. Diverticular changes
noted without diverticulitis.

Vascular/Lymphatic: Aortic atherosclerosis. No enlarged abdominal or
pelvic lymph nodes.

Reproductive: Prostate is unremarkable.

Other: No abdominal wall hernia or abnormality. No abdominopelvic
ascites.

Musculoskeletal: No acute or significant osseous findings.
IMPRESSION: 5 mm distal right ureteral stone with obstructive change.

Bilateral nonobstructing stones similar to that seen on the prior
exam.

Mild left lingular atelectasis.

## 2018-08-17 ENCOUNTER — Telehealth: Payer: Self-pay | Admitting: Pharmacist

## 2018-08-17 NOTE — Telephone Encounter (Signed)
08/17/2018 2:26:58 PM - refills-Advair & Ventolin  08/17/18 Placed refills online with [facility] for Advair 250/50 & Ventolin HFA, to release 08/31/18, order# [id number],JL

## 2018-08-31 IMAGING — CR DG ABDOMEN 1V
2 series · 2 of 2 positions shown · non-contrast
Comparison: None.

CLINICAL DATA: 56-year-old male with right ureteral obstructing
stone 11/13/2017. Pa[REDACTED]reased but has returned. Subsequent
encounter.

EXAM:
ABDOMEN - 1 VIEW

[abdomen kub (1 of 2)]
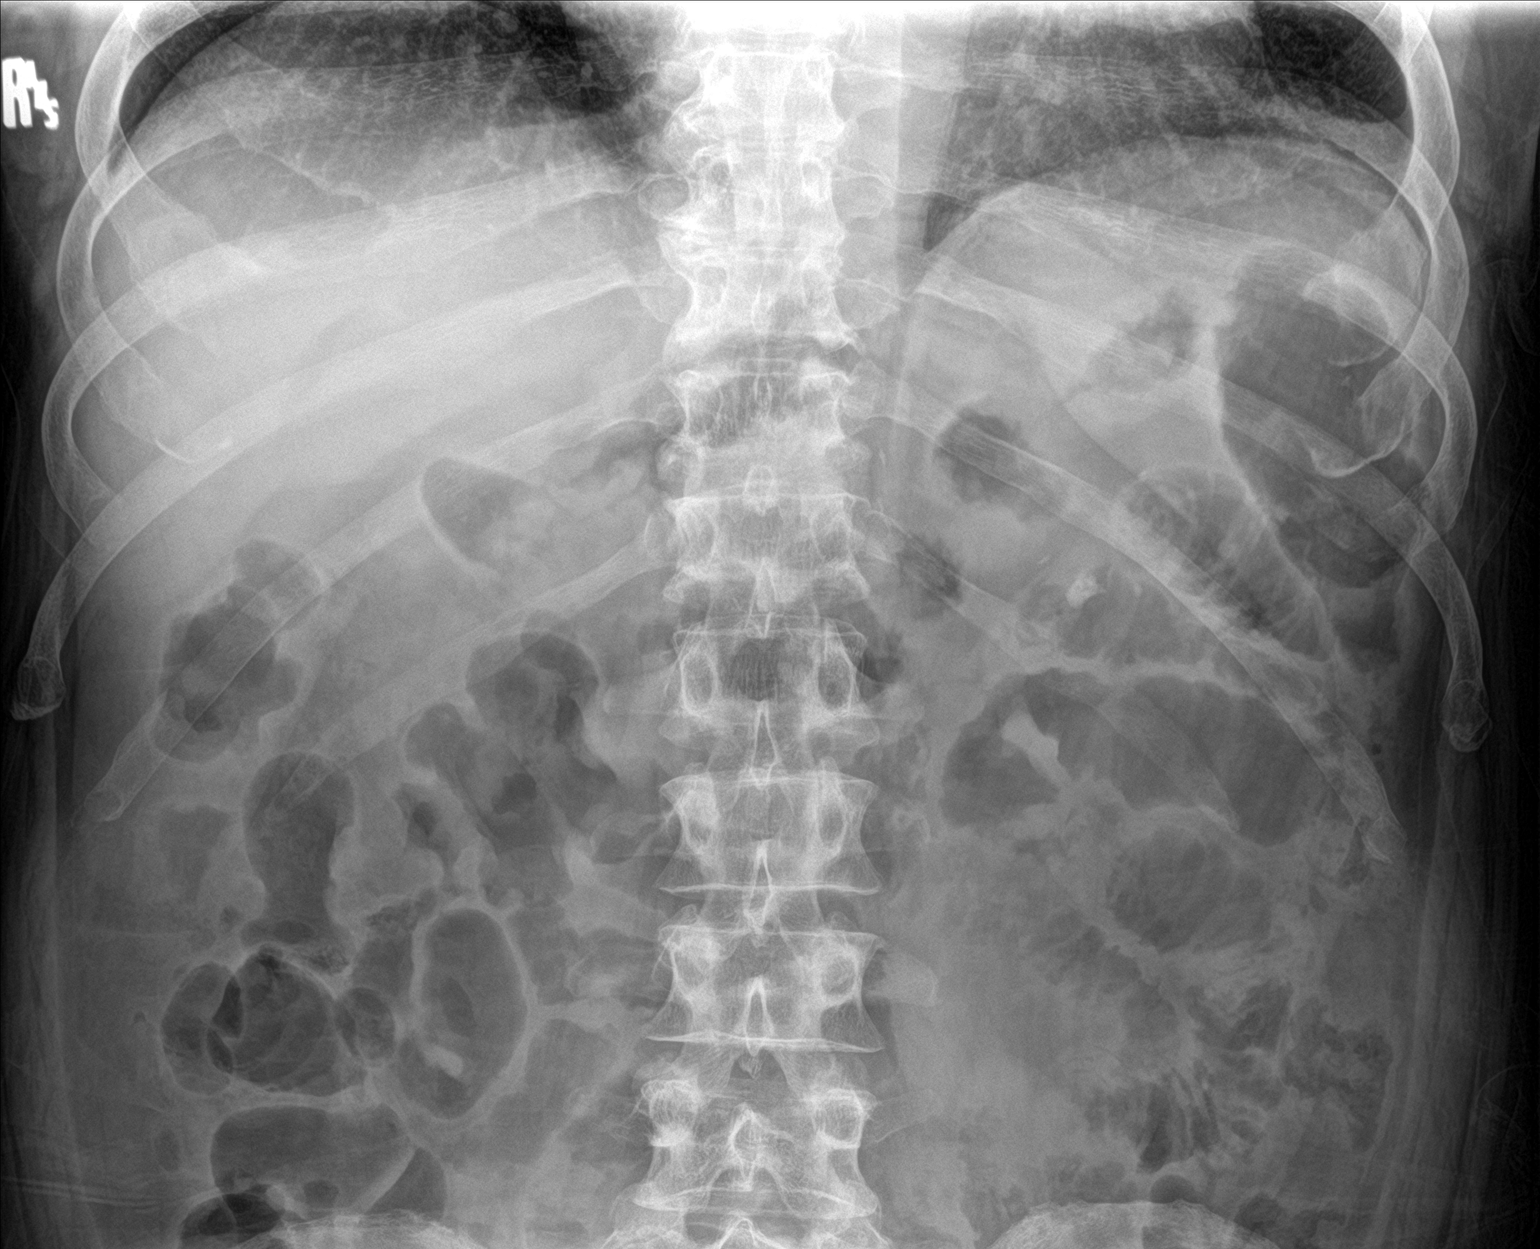

[abdomen kub (2 of 2)]
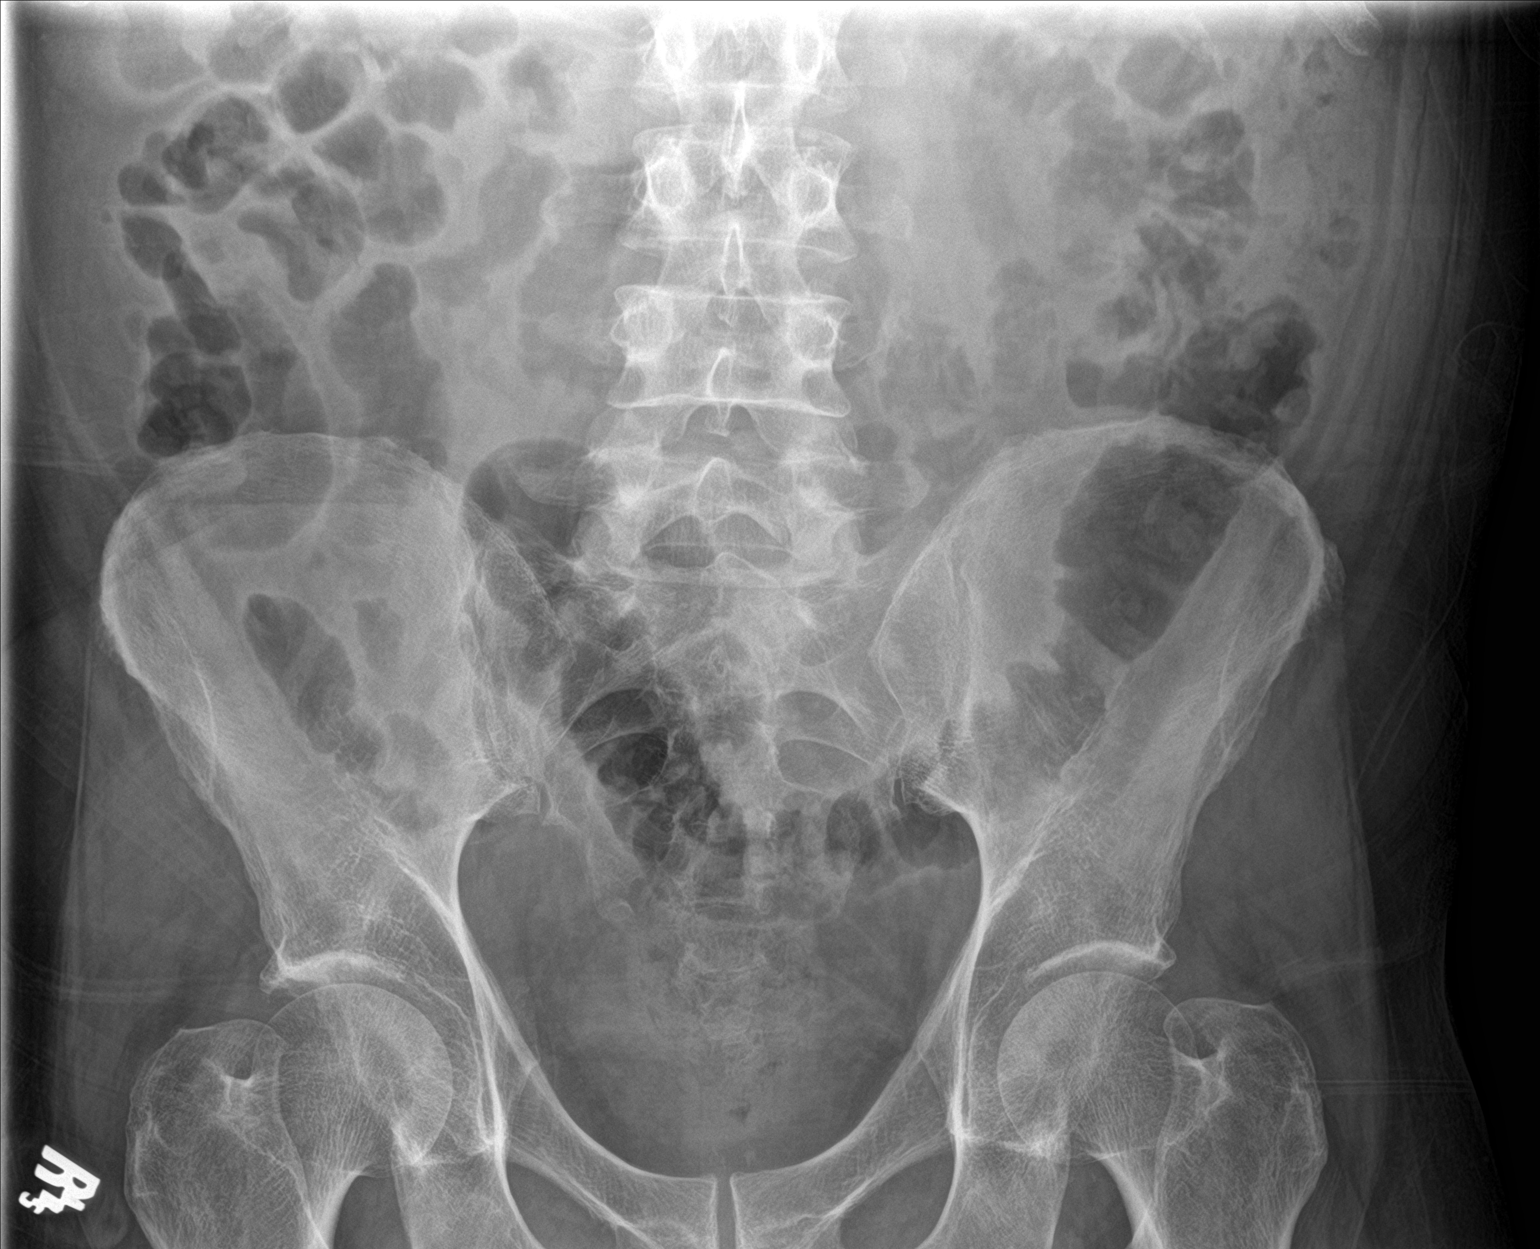

[2 of 2 positions shown; findings below may reference images not displayed]

FINDINGS: CT detected distal right ureteral obstructing stone not appreciated
on present plain film exam and may have passed.

Bilateral renal calculi larger on the left measuring up to 1 cm.

Slightly prominent size gas-filled small bowel loops measuring up to
3.4 cm. Significance indeterminate.

No acute osseous abnormality.
IMPRESSION: CT detected distal right ureteral obstructing stone not appreciated
on present plain film exam and may have passed.

Bilateral renal calculi larger on the left measuring up to 1 cm.

Slightly prominent size gas-filled small bowel loops measuring up to
3.4 cm. Significance indeterminate.

## 2018-09-01 ENCOUNTER — Other Ambulatory Visit: Payer: Self-pay

## 2018-09-01 ENCOUNTER — Encounter: Payer: Self-pay | Admitting: Adult Health

## 2018-09-01 ENCOUNTER — Ambulatory Visit: Payer: Medicaid Other | Admitting: Gerontology

## 2018-09-01 VITALS — BP 170/100 | HR 111 | Temp 97.9°F | Ht 72.0 in | Wt 185.2 lb

## 2018-09-01 DIAGNOSIS — M109 Gout, unspecified: Secondary | ICD-10-CM

## 2018-09-01 DIAGNOSIS — F1020 Alcohol dependence, uncomplicated: Secondary | ICD-10-CM

## 2018-09-01 DIAGNOSIS — R000 Tachycardia, unspecified: Secondary | ICD-10-CM

## 2018-09-01 DIAGNOSIS — I10 Essential (primary) hypertension: Secondary | ICD-10-CM

## 2018-09-01 DIAGNOSIS — K219 Gastro-esophageal reflux disease without esophagitis: Secondary | ICD-10-CM

## 2018-09-01 DIAGNOSIS — J4520 Mild intermittent asthma, uncomplicated: Secondary | ICD-10-CM

## 2018-09-01 DIAGNOSIS — N63 Unspecified lump in unspecified breast: Secondary | ICD-10-CM

## 2018-09-01 DIAGNOSIS — R252 Cramp and spasm: Secondary | ICD-10-CM

## 2018-09-01 MED ORDER — DIPHENHYDRAMINE HCL 50 MG PO TABS: 50.0000 mg | ORAL_TABLET | Freq: Every evening | ORAL | 1 refills | Status: DC | PRN

## 2018-09-01 MED ORDER — LISINOPRIL 20 MG PO TABS: 20.0000 mg | ORAL_TABLET | Freq: Every day | ORAL | 0 refills | Status: DC

## 2018-09-01 MED ORDER — CLONIDINE HCL 0.1 MG PO TABS: 0.1000 mg | ORAL_TABLET | Freq: Two times a day (BID) | ORAL | 3 refills | Status: DC

## 2018-09-01 MED ORDER — ALBUTEROL SULFATE HFA 108 (90 BASE) MCG/ACT IN AERS: 2.0000 | INHALATION_SPRAY | RESPIRATORY_TRACT | 0 refills | Status: DC | PRN

## 2018-09-01 MED ORDER — PANTOPRAZOLE SODIUM 40 MG PO TBEC: 40.0000 mg | DELAYED_RELEASE_TABLET | Freq: Every day | ORAL | 3 refills | Status: DC

## 2018-09-01 MED ORDER — VITAMIN B-1 100 MG PO TABS: 100.0000 mg | ORAL_TABLET | Freq: Every day | ORAL | 3 refills | Status: DC

## 2018-09-01 MED ORDER — ALLOPURINOL 300 MG PO TABS: 300.0000 mg | ORAL_TABLET | Freq: Every day | ORAL | 3 refills | Status: DC

## 2018-09-01 MED ORDER — FERROUS SULFATE 325 (65 FE) MG PO TABS: 325.0000 mg | ORAL_TABLET | Freq: Two times a day (BID) | ORAL | 3 refills | Status: DC

## 2018-09-01 MED ORDER — MAGNESIUM OXIDE 400 (241.3 MG) MG PO TABS: 400.0000 mg | ORAL_TABLET | Freq: Every day | ORAL | 3 refills | Status: DC

## 2018-09-01 MED ORDER — TIZANIDINE HCL 4 MG PO TABS: 4.0000 mg | ORAL_TABLET | Freq: Four times a day (QID) | ORAL | 0 refills | Status: DC | PRN

## 2018-09-01 MED ORDER — FLUTICASONE-SALMETEROL 100-50 MCG/DOSE IN AEPB: 1.0000 | INHALATION_SPRAY | Freq: Two times a day (BID) | RESPIRATORY_TRACT | 0 refills | Status: DC

## 2018-09-01 MED ORDER — MULTI-VITAMIN/MINERALS PO TABS: 1.0000 | ORAL_TABLET | Freq: Every day | ORAL | 3 refills | Status: DC

## 2018-09-01 MED ORDER — CITALOPRAM HYDROBROMIDE 10 MG PO TABS: 10.0000 mg | ORAL_TABLET | Freq: Every day | ORAL | 2 refills | Status: DC

## 2018-09-01 MED ORDER — FOLIC ACID 1 MG PO TABS: 1.0000 mg | ORAL_TABLET | Freq: Every day | ORAL | 3 refills | Status: DC

## 2018-09-01 NOTE — Patient Instructions (Signed)
Tizanidine  Hypertension Hypertension is another name for high blood pressure. High blood pressure forces your heart to work harder to pump blood. This can cause problems over time. There are two numbers in a blood pressure reading. There is a top number (systolic) over a bottom number (diastolic). It is best to have a blood pressure below 120/80. Healthy choices can help lower your blood pressure. You may need medicine to help lower your blood pressure if:  Your blood pressure cannot be lowered with healthy choices.  Your blood pressure is higher than 130/80.  Follow these instructions at home: Eating and drinking  If directed, follow the DASH eating plan. This diet includes: ? Filling half of your plate at each meal with fruits and vegetables. ? Filling one quarter of your plate at each meal with whole grains. Whole grains include whole wheat pasta, brown rice, and whole grain bread. ? Eating or drinking low-fat dairy products, such as skim milk or low-fat yogurt. ? Filling one quarter of your plate at each meal with low-fat (lean) proteins. Low-fat proteins include fish, skinless chicken, eggs, beans, and tofu. ? Avoiding fatty meat, cured and processed meat, or chicken with skin. ? Avoiding premade or processed food.  Eat less than 1,500 mg of salt (sodium) a day.  Limit alcohol use to no more than 1 drink a day for nonpregnant women and 2 drinks a day for men. One drink equals 12 oz of beer, 5 oz of wine, or 1 oz of hard liquor. Lifestyle  Work with your doctor to stay at a healthy weight or to lose weight. Ask your doctor what the best weight is for you.  Get at least 30 minutes of exercise that causes your heart to beat faster (aerobic exercise) most days of the week. This may include walking, swimming, or biking.  Get at least 30 minutes of exercise that strengthens your muscles (resistance exercise) at least 3 days a week. This may include lifting weights or pilates.  Do not  use any products that contain nicotine or tobacco. This includes cigarettes and e-cigarettes. If you need help quitting, ask your doctor.  Check your blood pressure at home as told by your doctor.  Keep all follow-up visits as told by your doctor. This is important. Medicines  Take over-the-counter and prescription medicines only as told by your doctor. Follow directions carefully.  Do not skip doses of blood pressure medicine. The medicine does not work as well if you skip doses. Skipping doses also puts you at risk for problems.  Ask your doctor about side effects or reactions to medicines that you should watch for. Contact a doctor if:  You think you are having a reaction to the medicine you are taking.  You have headaches that keep coming back (recurring).  You feel dizzy.  You have swelling in your ankles.  You have trouble with your vision. Get help right away if:  You get a very bad headache.  You start to feel confused.  You feel weak or numb.  You feel faint.  You get very bad pain in your: ? Chest. ? Belly (abdomen).  You throw up (vomit) more than once.  You have trouble breathing. Summary  Hypertension is another name for high blood pressure.  Making healthy choices can help lower blood pressure. If your blood pressure cannot be controlled with healthy choices, you may need to take medicine. This information is not intended to replace advice given to you by your  health care provider. Make sure you discuss any questions you have with your health care provider. Document Released: 04/20/2008 Document Revised: 09/30/2016 Document Reviewed: 09/30/2016 Elsevier Interactive Patient Education  2018 [employer]. Tizanidine tablets or capsules What is this medicine? TIZANIDINE (tye ZAN i deen) helps to relieve muscle spasms. It may be used to help in the treatment of multiple sclerosis and spinal cord injury. This medicine may be used for other purposes; ask  your health care provider or pharmacist if you have questions. COMMON BRAND NAME(S): Zanaflex What should I tell my health care provider before I take this medicine? They need to know if you have any of these conditions: -kidney disease -liver disease -low blood pressure -mental disorder -an unusual or allergic reaction to tizanidine, other medicines, lactose (tablets only), foods, dyes, or preservatives -pregnant or trying to get pregnant -breast-feeding How should I use this medicine? Take this medicine by mouth with a full glass of water. Take this medicine on an empty stomach, at least 30 minutes before or 2 hours after food. Do not take with food unless you talk with your doctor. Follow the directions on the prescription label. Take your medicine at regular intervals. Do not take your medicine more often than directed. Do not stop taking except on your doctor's advice. Suddenly stopping the medicine can be very dangerous. Talk to your pediatrician regarding the use of this medicine in children. Patients over 69 years old may have a stronger reaction and need a smaller dose. Overdosage: If you think you have taken too much of this medicine contact a poison control center or emergency room at once. NOTE: This medicine is only for you. Do not share this medicine with others. What if I miss a dose? If you miss a dose, take it as soon as you can. If it is almost time for your next dose, take only that dose. Do not take double or extra doses. What may interact with this medicine? Do not take this medicine with any of the following medications: -ciprofloxacin -cisapride -dofetilide -dronedarone -fluvoxamine -narcotic medicines for cough -pimozide -thiabendazole -thioridazine -ziprasidone This medicine may also interact with the following medications: -acyclovir -alcohol -antihistamines for allergy, cough and cold -baclofen -certain antibiotics like levofloxacin, ofloxacin -certain  medicines for anxiety or sleep -certain medicines for blood pressure, heart disease, irregular heart beat -certain medicines for depression like amitriptyline, fluoxetine, sertraline -certain medicines for seizures like phenobarbital, primidone -certain medicines for stomach problems like cimetidine, famotidine -male hormones, like estrogens or progestins and birth control pills, patches, rings, or injections -general anesthetics like halothane, isoflurane, methoxyflurane, propofol -local anesthetics like lidocaine, pramoxine, tetracaine -medicines that relax muscles for surgery -narcotic medicines for pain -other medicines that prolong the QT interval (cause an abnormal heart rhythm) -phenothiazines like chlorpromazine, mesoridazine, prochlorperazine -ticlopidine -zileuton This list may not describe all possible interactions. Give your health care provider a list of all the medicines, herbs, non-prescription drugs, or dietary supplements you use. Also tell them if you smoke, drink alcohol, or use illegal drugs. Some items may interact with your medicine. What should I watch for while using this medicine? Tell your doctor or health care professional if your symptoms do not start to get better or if they get worse. You may get drowsy or dizzy. Do not drive, use machinery, or do anything that needs mental alertness until you know how this medicine affects you. Do not stand or sit up quickly, especially if you are an older patient. This reduces the  risk of dizzy or fainting spells. Alcohol may interfere with the effect of this medicine. Avoid alcoholic drinks. If you are taking another medicine that also causes drowsiness, you may have more side effects. Give your health care provider a list of all medicines you use. Your doctor will tell you how much medicine to take. Do not take more medicine than directed. Call emergency for help if you have problems breathing or unusual sleepiness. Your mouth  may get dry. Chewing sugarless gum or sucking hard candy, and drinking plenty of water may help. Contact your doctor if the problem does not go away or is severe. What side effects may I notice from receiving this medicine? Side effects that you should report to your doctor or health care professional as soon as possible: -allergic reactions like skin rash, itching or hives, swelling of the face, lips, or tongue -breathing problems -hallucinations -signs and symptoms of liver injury like dark yellow or brown urine; general ill feeling or flu-like symptoms; light-colored stools; loss of appetite; nausea; right upper quadrant belly pain; unusually weak or tired; yellowing of the eyes or skin -signs and symptoms of low blood pressure like dizziness; feeling faint or lightheaded, falls; unusually weak or tired -unusually slow heartbeat -unusually weak or tired Side effects that usually do not require medical attention (report to your doctor or health care professional if they continue or are bothersome): -blurred vision -constipation -dizziness -dry mouth -tiredness This list may not describe all possible side effects. Call your doctor for medical advice about side effects. You may report side effects to FDA at 1-800-FDA-1088. Where should I keep my medicine? Keep out of the reach of children. Store at room temperature between 15 and 30 degrees C (59 and 86 degrees F). Throw away any unused medicine after the expiration date. NOTE: This sheet is a summary. It may not cover all possible information. If you have questions about this medicine, talk to your doctor, pharmacist, or health care provider.  2018 Elsevier/Gold Standard (2015-08-13 13:52:12)

## 2018-09-01 NOTE — Progress Notes (Signed)
Patient: Miguel [name] Male    DOB: 02/12/1961   57 y.o.   MRN: [id number] Visit Date: 09/01/2018  Today's Provider: Langston Reusing, NP   Chief Complaint  Patient presents with  . Follow-up    not sleeping well   Subjective:    HPI  Mr Enderle is a 56y/o male with history ETOH, states he drinks a pint of liquor 2 times a week, states he's tapering down on his alcohol consumption. States that he has mild visible hand tremor. States that UTI has resolved. Reports taking thiamine, folic acid, multivitamin. He states that protonix is not relieving his reflux but he admits to not taking it everyday.He gets 5-6 hrs of sleep and think he's not getting enough rest. Denies chest pain, palpitation and shortness of breath. States he checks BP everyday, but has not being taking his Clonidine 0.1mg  twice a day.  C/o muscle cramps, tingling and sharp pain to feet while he's sleeping.  Left Breast mass that feels different, it hurts when touched and when lying on the left side. He states that the lump has being there for some months and he forgets to notify any provider.    No Known Allergies Previous Medications   ALBUTEROL (VENTOLIN HFA) 108 (90 BASE) MCG/ACT INHALER    Inhale 2 puffs into the lungs every 4 (four) hours as needed.   ALLOPURINOL (ZYLOPRIM) 300 MG TABLET    Take 1 tablet (300 mg total) by mouth daily.   CITALOPRAM (CELEXA) 10 MG TABLET    Take 1 tablet (10 mg total) by mouth at bedtime.   CLONIDINE (CATAPRES) 0.1 MG TABLET    Take 1 tablet (0.1 mg total) by mouth 2 (two) times daily.   DIPHENHYDRAMINE (BENADRYL) 50 MG TABLET    Take 1 tablet (50 mg total) by mouth at bedtime as needed for itching.   FERROUS SULFATE 325 (65 FE) MG TABLET    Take 1 tablet (325 mg total) by mouth 2 (two) times daily with a meal.   FLUTICASONE-SALMETEROL (ADVAIR DISKUS) 100-50 MCG/DOSE AEPB    Inhale 1 puff into the lungs 2 (two) times daily.   FOLIC ACID (FOLVITE) 1 MG TABLET    Take 1 tablet (1 mg  total) by mouth daily.   MAGNESIUM OXIDE (MAG-OX) 400 (241.3 MG) MG TABLET    Take 1 tablet (400 mg total) by mouth daily.   MULTIPLE VITAMINS-MINERALS (MULTIVITAMIN WITH MINERALS) TABLET    Take 1 tablet by mouth daily.   PANTOPRAZOLE (PROTONIX) 40 MG TABLET    Take 1 tablet (40 mg total) by mouth 2 (two) times daily before a meal.   THIAMINE (VITAMIN B-1) 100 MG TABLET    Take 1 tablet (100 mg total) by mouth daily.    Review of Systems  Constitutional: Negative.   HENT: Negative.   Eyes: Negative.   Respiratory: Negative.   Cardiovascular: Negative.   Gastrointestinal: Negative.   Endocrine: Negative.   Genitourinary: Negative.   Musculoskeletal: Positive for arthralgias.  Skin: Negative.   Neurological: Negative.   Psychiatric/Behavioral: Negative.     Social History   Tobacco Use  . Smoking status: Former Research scientist (life sciences)  . Smokeless tobacco: Never Used  . Tobacco comment: quit 30 years ago  Substance Use Topics  . Alcohol use: Not Currently    Alcohol/week: 7.0 standard drinks    Types: 7 Glasses of wine per week    Comment: Pt states, "I quit all the hard stuff 4 months ago"  Objective:   BP (!) 183/113 (BP Location: Left Arm, Patient Position: Sitting)   Pulse (!) 111   Temp 97.9 F (36.6 C)   Ht 6' (1.829 m)   Wt 185 lb 3.2 oz (84 kg)   SpO2 98%   BMI 25.12 kg/m   Physical Exam  Constitutional: He is oriented to person, place, and time. He appears well-developed and well-nourished.  HENT:  Head: Normocephalic and atraumatic.  Eyes: Pupils are equal, round, and reactive to light. Conjunctivae and EOM are normal.  Neck: Normal range of motion.  Cardiovascular: Regular rhythm. Tachycardia present.  Pulmonary/Chest: Effort normal and breath sounds normal.  Abdominal: Soft. Bowel sounds are normal.  Musculoskeletal: Normal range of motion.  Neurological: He is alert and oriented to person, place, and time.  Skin: Skin is warm and dry.  Psychiatric: He has a normal  mood and affect. His behavior is normal. Judgment and thought content normal.  Nursing note reviewed. Hard lump to upper quadrant of left breast. Tenderness with touch      Assessment & Plan:     1. Essential hypertension Uncontrolled blood pressure,patient's BP was rechecked was 170/100, poorly controlled BP due to non adherence to medications. He is to continue clonidine 0.1mg  twice a day and start Lisinopril 20 mg daily. Will follow up in 1 week. Patient was advised to keep BP log and bring to next appointment.  2. Mild intermittent asthma without complication Well controlled with Advair and albuterol  3. Gastroesophageal reflux disease without esophagitis Non adherence to medications, continue Pantoprazole 40 mg twice a day before meal.  4. Tachycardia Continue on BP medicines and continue ETOH taper , BP and HR recheck in 1 week. 5. Gout, unspecified cause, unspecified chronicity, unspecified site Non compliant with prophylactic medication, encouraged patient to take Allopurinol 300 mg daily.  6. Alcohol use disorder, severe, dependence ([address]) Patient encouraged to continue tapering on his alcohol consumption. Continue to take folic acid, thiamine, magnesium oxide and multivitamin.    7. Muscle cramp, nocturnal Started patient on Tizanidine 4 mg po every 6 hours as needed for muscle spasms.   9. Breast lump or mass Referral for Mammogram      Marco Raper Jerold Coombe, NP   [facility]

## 2018-09-07 ENCOUNTER — Telehealth: Payer: Self-pay

## 2018-09-07 DIAGNOSIS — N63 Unspecified lump in unspecified breast: Secondary | ICD-10-CM

## 2018-09-07 NOTE — Telephone Encounter (Signed)
Patient needs mammogram

## 2018-09-08 ENCOUNTER — Ambulatory Visit: Payer: Medicaid Other | Admitting: Gerontology

## 2018-09-08 ENCOUNTER — Encounter: Payer: Self-pay | Admitting: Gerontology

## 2018-09-08 ENCOUNTER — Other Ambulatory Visit: Payer: Self-pay

## 2018-09-08 VITALS — BP 178/100 | HR 88 | Wt 189.0 lb

## 2018-09-08 DIAGNOSIS — M109 Gout, unspecified: Secondary | ICD-10-CM

## 2018-09-08 DIAGNOSIS — J4520 Mild intermittent asthma, uncomplicated: Secondary | ICD-10-CM

## 2018-09-08 DIAGNOSIS — N63 Unspecified lump in unspecified breast: Secondary | ICD-10-CM

## 2018-09-08 DIAGNOSIS — Z0000 Encounter for general adult medical examination without abnormal findings: Secondary | ICD-10-CM

## 2018-09-08 DIAGNOSIS — F1020 Alcohol dependence, uncomplicated: Secondary | ICD-10-CM

## 2018-09-08 DIAGNOSIS — K219 Gastro-esophageal reflux disease without esophagitis: Secondary | ICD-10-CM

## 2018-09-08 DIAGNOSIS — I10 Essential (primary) hypertension: Secondary | ICD-10-CM

## 2018-09-08 MED ORDER — LISINOPRIL 20 MG PO TABS: 10.0000 mg | ORAL_TABLET | Freq: Every day | ORAL | 3 refills | Status: DC

## 2018-09-08 NOTE — Patient Instructions (Signed)
DASH Eating Plan DASH stands for "Dietary Approaches to Stop Hypertension." The DASH eating plan is a healthy eating plan that has been shown to reduce high blood pressure (hypertension). It may also reduce your risk for type 2 diabetes, heart disease, and stroke. The DASH eating plan may also help with weight loss. What are tips for following this plan? General guidelines  Avoid eating more than 2,300 mg (milligrams) of salt (sodium) a day. If you have hypertension, you may need to reduce your sodium intake to 1,500 mg a day.  Limit alcohol intake to no more than 1 drink a day for nonpregnant women and 2 drinks a day for men. One drink equals 12 oz of beer, 5 oz of wine, or 1 oz of hard liquor.  Work with your health care provider to maintain a healthy body weight or to lose weight. Ask what an ideal weight is for you.  Get at least 30 minutes of exercise that causes your heart to beat faster (aerobic exercise) most days of the week. Activities may include walking, swimming, or biking.  Work with your health care provider or diet and nutrition specialist (dietitian) to adjust your eating plan to your individual calorie needs. Reading food labels  Check food labels for the amount of sodium per serving. Choose foods with less than 5 percent of the Daily Value of sodium. Generally, foods with less than 300 mg of sodium per serving fit into this eating plan.  To find whole grains, look for the word "whole" as the first word in the ingredient list. Shopping  Buy products labeled as "low-sodium" or "no salt added."  Buy fresh foods. Avoid canned foods and premade or frozen meals. Cooking  Avoid adding salt when cooking. Use salt-free seasonings or herbs instead of table salt or sea salt. Check with your health care provider or pharmacist before using salt substitutes.  Do not fry foods. Cook foods using healthy methods such as baking, boiling, grilling, and broiling instead.  Cook with  heart-healthy oils, such as olive, canola, soybean, or sunflower oil. Meal planning   Eat a balanced diet that includes: ? 5 or more servings of fruits and vegetables each day. At each meal, try to fill half of your plate with fruits and vegetables. ? Up to 6-8 servings of whole grains each day. ? Less than 6 oz of lean meat, poultry, or fish each day. A 3-oz serving of meat is about the same size as a deck of cards. One egg equals 1 oz. ? 2 servings of low-fat dairy each day. ? A serving of nuts, seeds, or beans 5 times each week. ? Heart-healthy fats. Healthy fats called Omega-3 fatty acids are found in foods such as flaxseeds and coldwater fish, like sardines, salmon, and mackerel.  Limit how much you eat of the following: ? Canned or prepackaged foods. ? Food that is high in trans fat, such as fried foods. ? Food that is high in saturated fat, such as fatty meat. ? Sweets, desserts, sugary drinks, and other foods with added sugar. ? Full-fat dairy products.  Do not salt foods before eating.  Try to eat at least 2 vegetarian meals each week.  Eat more home-cooked food and less restaurant, buffet, and fast food.  When eating at a restaurant, ask that your food be prepared with less salt or no salt, if possible. What foods are recommended? The items listed may not be a complete list. Talk with your dietitian about what   dietary choices are best for you. Grains Whole-grain or whole-wheat bread. Whole-grain or whole-wheat pasta. Brown rice. Oatmeal. Quinoa. Bulgur. Whole-grain and low-sodium cereals. Pita bread. Low-fat, low-sodium crackers. Whole-wheat flour tortillas. Vegetables Fresh or frozen vegetables (raw, steamed, roasted, or grilled). Low-sodium or reduced-sodium tomato and vegetable juice. Low-sodium or reduced-sodium tomato sauce and tomato paste. Low-sodium or reduced-sodium canned vegetables. Fruits All fresh, dried, or frozen fruit. Canned fruit in natural juice (without  added sugar). Meat and other protein foods Skinless chicken or turkey. Ground chicken or turkey. Pork with fat trimmed off. Fish and seafood. Egg whites. Dried beans, peas, or lentils. Unsalted nuts, nut butters, and seeds. Unsalted canned beans. Lean cuts of beef with fat trimmed off. Low-sodium, lean deli meat. Dairy Low-fat (1%) or fat-free (skim) milk. Fat-free, low-fat, or reduced-fat cheeses. Nonfat, low-sodium ricotta or cottage cheese. Low-fat or nonfat yogurt. Low-fat, low-sodium cheese. Fats and oils Soft margarine without trans fats. Vegetable oil. Low-fat, reduced-fat, or light mayonnaise and salad dressings (reduced-sodium). Canola, safflower, olive, soybean, and sunflower oils. Avocado. Seasoning and other foods Herbs. Spices. Seasoning mixes without salt. Unsalted popcorn and pretzels. Fat-free sweets. What foods are not recommended? The items listed may not be a complete list. Talk with your dietitian about what dietary choices are best for you. Grains Baked goods made with fat, such as croissants, muffins, or some breads. Dry pasta or rice meal packs. Vegetables Creamed or fried vegetables. Vegetables in a cheese sauce. Regular canned vegetables (not low-sodium or reduced-sodium). Regular canned tomato sauce and paste (not low-sodium or reduced-sodium). Regular tomato and vegetable juice (not low-sodium or reduced-sodium). Pickles. Olives. Fruits Canned fruit in a light or heavy syrup. Fried fruit. Fruit in cream or butter sauce. Meat and other protein foods Fatty cuts of meat. Ribs. Fried meat. Bacon. Sausage. Bologna and other processed lunch meats. Salami. Fatback. Hotdogs. Bratwurst. Salted nuts and seeds. Canned beans with added salt. Canned or smoked fish. Whole eggs or egg yolks. Chicken or turkey with skin. Dairy Whole or 2% milk, cream, and half-and-half. Whole or full-fat cream cheese. Whole-fat or sweetened yogurt. Full-fat cheese. Nondairy creamers. Whipped toppings.  Processed cheese and cheese spreads. Fats and oils Butter. Stick margarine. Lard. Shortening. Ghee. Bacon fat. Tropical oils, such as coconut, palm kernel, or palm oil. Seasoning and other foods Salted popcorn and pretzels. Onion salt, garlic salt, seasoned salt, table salt, and sea salt. Worcestershire sauce. Tartar sauce. Barbecue sauce. Teriyaki sauce. Soy sauce, including reduced-sodium. Steak sauce. Canned and packaged gravies. Fish sauce. Oyster sauce. Cocktail sauce. Horseradish that you find on the shelf. Ketchup. Mustard. Meat flavorings and tenderizers. Bouillon cubes. Hot sauce and Tabasco sauce. Premade or packaged marinades. Premade or packaged taco seasonings. Relishes. Regular salad dressings. Where to find more information:  National Heart, Lung, and Blood Institute: www.nhlbi.nih.gov  American Heart Association: www.heart.org Summary  The DASH eating plan is a healthy eating plan that has been shown to reduce high blood pressure (hypertension). It may also reduce your risk for type 2 diabetes, heart disease, and stroke.  With the DASH eating plan, you should limit salt (sodium) intake to 2,300 mg a day. If you have hypertension, you may need to reduce your sodium intake to 1,500 mg a day.  When on the DASH eating plan, aim to eat more fresh fruits and vegetables, whole grains, lean proteins, low-fat dairy, and heart-healthy fats.  Work with your health care provider or diet and nutrition specialist (dietitian) to adjust your eating plan to your individual   calorie needs. This information is not intended to replace advice given to you by your health care provider. Make sure you discuss any questions you have with your health care provider. Document Released: 10/22/2011 Document Revised: 10/26/2016 Document Reviewed: 10/26/2016 Elsevier Interactive Patient Education  2018 [employer]  

## 2018-09-08 NOTE — Progress Notes (Signed)
Patient: Miguel [name] Male    DOB: [birth year]   57 y.o.   MRN: [id number] Visit Date: 09/08/2018  Today's Provider: Langston Reusing, NP   Chief Complaint  Patient presents with  . Follow-up    recheck blood pressure   Subjective:    HPI Mr Miguel [name] 57 y/o male presents for follow up on BP. States BP was 186/112, states he took 10 mg Lisinopril because his BP drops during the day, prior to coming to the clinic, but has not taking 0.1mg  of Clonidine. States BP is usually high in the mornings and it was 190/80. Patient agreed to take 0.1mg  clonidine in morning, 0.1mg  clonidine with 10 mg Lisinopril in the evening. Will keep a BP log and bring with him next week. Denies headache, blurry vision, chest pain, palpitation and dizziness  States lump to left breast continues to bother him, gave him phone number to call and schedule appointment for mammogram. He states breathing has improved, uses inhaler as prescribed. He states GERD has improved and has being taking Protonix 40 mg d.aily before breakfast He states has cut back on his alcoholic intake, drinks 1 shot once in a while,still experiences mild hand tremor. He reports that leg cramps has improved with taking Tizanidine as needed.     No Known Allergies Previous Medications   ALBUTEROL (VENTOLIN HFA) 108 (90 BASE) MCG/ACT INHALER    Inhale 2 puffs into the lungs every 4 (four) hours as needed.   ALLOPURINOL (ZYLOPRIM) 300 MG TABLET    Take 1 tablet (300 mg total) by mouth daily.   CITALOPRAM (CELEXA) 10 MG TABLET    Take 1 tablet (10 mg total) by mouth at bedtime.   CLONIDINE (CATAPRES) 0.1 MG TABLET    Take 1 tablet (0.1 mg total) by mouth 2 (two) times daily.   DIPHENHYDRAMINE (BENADRYL) 50 MG TABLET    Take 1 tablet (50 mg total) by mouth at bedtime as needed for itching.   FERROUS SULFATE 325 (65 FE) MG TABLET    Take 1 tablet (325 mg total) by mouth 2 (two) times daily with a meal.   FLUTICASONE-SALMETEROL (ADVAIR DISKUS) 100-50  MCG/DOSE AEPB    Inhale 1 puff into the lungs 2 (two) times daily.   FOLIC ACID (FOLVITE) 1 MG TABLET    Take 1 tablet (1 mg total) by mouth daily.   MAGNESIUM OXIDE (MAG-OX) 400 (241.3 MG) MG TABLET    Take 1 tablet (400 mg total) by mouth daily.   MULTIPLE VITAMINS-MINERALS (MULTIVITAMIN WITH MINERALS) TABLET    Take 1 tablet by mouth daily.   PANTOPRAZOLE (PROTONIX) 40 MG TABLET    Take 1 tablet (40 mg total) by mouth daily.   THIAMINE (VITAMIN B-1) 100 MG TABLET    Take 1 tablet (100 mg total) by mouth daily.   TIZANIDINE (ZANAFLEX) 4 MG TABLET    Take 1 tablet (4 mg total) by mouth every 6 (six) hours as needed for muscle spasms.    Review of Systems  Constitutional: Negative.   HENT: Negative.   Eyes: Negative.   Respiratory: Negative.   Cardiovascular: Negative.   Gastrointestinal: Negative.   Genitourinary: Negative.   Musculoskeletal: Negative.   Skin: Negative.   Neurological: Negative.   Psychiatric/Behavioral: Negative.     Social History   Tobacco Use  . Smoking status: Former Research scientist (life sciences)  . Smokeless tobacco: Never Used  . Tobacco comment: quit 30 years ago  Substance Use Topics  . Alcohol use: Not  Currently    Alcohol/week: 7.0 standard drinks    Types: 7 Glasses of wine per week    Comment: Pt states, "I quit all the hard stuff 4 months ago"   Objective:   BP (!) 178/100 (BP Location: Left Arm, Patient Position: Sitting, Cuff Size: Normal)   Pulse 88   Wt 189 lb (85.7 kg)   SpO2 98%   BMI 25.63 kg/m   Physical Exam  Constitutional: He is oriented to person, place, and time. He appears well-developed and well-nourished.  HENT:  Head: Normocephalic and atraumatic.  Eyes: Pupils are equal, round, and reactive to light. EOM are normal.  Neck: Normal range of motion.  Cardiovascular: Normal rate and regular rhythm.  Pulmonary/Chest: Effort normal and breath sounds normal.  Abdominal: Soft. Bowel sounds are normal.  Musculoskeletal: Normal range of motion.   Neurological: He is alert and oriented to person, place, and time.  Skin: Skin is warm and dry.  Psychiatric: He has a normal mood and affect.        Assessment & Plan:     Essential hypertension  Mild intermittent asthma without complication  Gastroesophageal reflux disease without esophagitis  Gout, unspecified cause, unspecified chronicity, unspecified site  Breast lump or mass  Alcohol use disorder, severe, dependence ([place])  Health care maintenance  Essential Hypertension: BP not controlled, patient is non compliant with medication.  BP goal < 140/80,  Rechecked BP was 178/100, He agreed to take 0.1mg  clonidine in morning and 0.1mg  Clonidine and 10 mg Lisinopril in the evening Check BP in the morning and evening, Keep log and bring to appointment in 2 weeks.  Mild Intermitent asthma without complication; Well controlled with Advair and albuterol Gastroesophageal Reflux disease without esophagitis: Well controlled, compliant with 40 mg Protonix daily before breakfast Gout: Well controlled with Allopurinol 300 mg daily. Alcohol use disorder, severe dependence:   Continues tapering his alcoholic consumption. Denies any withdrawal symptoms. Breast Lump Patient provided Mammogram referral number.  Langston Reusing, NP   [facility]

## 2018-09-13 ENCOUNTER — Encounter: Payer: Self-pay | Admitting: *Deleted

## 2018-09-13 ENCOUNTER — Inpatient Hospital Stay
Admission: EM | Admit: 2018-09-13 | Discharge: 2018-09-15 | DRG: 872 | Disposition: A | Discharge status: Home / Self Care | Payer: Self-pay | Attending: Internal Medicine | Admitting: Internal Medicine

## 2018-09-13 ENCOUNTER — Other Ambulatory Visit: Payer: Self-pay

## 2018-09-13 DIAGNOSIS — E869 Volume depletion, unspecified: Secondary | ICD-10-CM | POA: Diagnosis present

## 2018-09-13 DIAGNOSIS — N390 Urinary tract infection, site not specified: Secondary | ICD-10-CM | POA: Diagnosis present

## 2018-09-13 DIAGNOSIS — M25562 Pain in left knee: Secondary | ICD-10-CM | POA: Diagnosis present

## 2018-09-13 DIAGNOSIS — Z87891 Personal history of nicotine dependence: Secondary | ICD-10-CM

## 2018-09-13 DIAGNOSIS — Z9049 Acquired absence of other specified parts of digestive tract: Secondary | ICD-10-CM

## 2018-09-13 DIAGNOSIS — R7989 Other specified abnormal findings of blood chemistry: Secondary | ICD-10-CM | POA: Diagnosis present

## 2018-09-13 DIAGNOSIS — Z811 Family history of alcohol abuse and dependence: Secondary | ICD-10-CM

## 2018-09-13 DIAGNOSIS — N179 Acute kidney failure, unspecified: Secondary | ICD-10-CM | POA: Diagnosis present

## 2018-09-13 DIAGNOSIS — M109 Gout, unspecified: Secondary | ICD-10-CM | POA: Diagnosis present

## 2018-09-13 DIAGNOSIS — N200 Calculus of kidney: Secondary | ICD-10-CM | POA: Diagnosis present

## 2018-09-13 DIAGNOSIS — J45909 Unspecified asthma, uncomplicated: Secondary | ICD-10-CM | POA: Diagnosis present

## 2018-09-13 DIAGNOSIS — A419 Sepsis, unspecified organism: Principal | ICD-10-CM | POA: Diagnosis present

## 2018-09-13 DIAGNOSIS — E860 Dehydration: Secondary | ICD-10-CM | POA: Diagnosis present

## 2018-09-13 DIAGNOSIS — F1020 Alcohol dependence, uncomplicated: Secondary | ICD-10-CM | POA: Diagnosis present

## 2018-09-13 DIAGNOSIS — F429 Obsessive-compulsive disorder, unspecified: Secondary | ICD-10-CM | POA: Diagnosis present

## 2018-09-13 DIAGNOSIS — K219 Gastro-esophageal reflux disease without esophagitis: Secondary | ICD-10-CM | POA: Diagnosis present

## 2018-09-13 DIAGNOSIS — Z7951 Long term (current) use of inhaled steroids: Secondary | ICD-10-CM

## 2018-09-13 DIAGNOSIS — Z79899 Other long term (current) drug therapy: Secondary | ICD-10-CM

## 2018-09-13 DIAGNOSIS — E876 Hypokalemia: Secondary | ICD-10-CM | POA: Diagnosis present

## 2018-09-13 DIAGNOSIS — R17 Unspecified jaundice: Secondary | ICD-10-CM | POA: Diagnosis present

## 2018-09-13 DIAGNOSIS — I10 Essential (primary) hypertension: Secondary | ICD-10-CM | POA: Diagnosis present

## 2018-09-13 LAB — COMPREHENSIVE METABOLIC PANEL
ALT: 9 U/L (ref 0–44)
AST: 17 U/L (ref 15–41)
Albumin: 4.7 g/dL (ref 3.5–5.0)
Alkaline Phosphatase: 70 U/L (ref 38–126)
Anion gap: 14 (ref 5–15)
BUN: 10 mg/dL (ref 6–20)
CO2: 21 mmol/L — ABNORMAL LOW (ref 22–32)
Calcium: 9.2 mg/dL (ref 8.9–10.3)
Chloride: 101 mmol/L (ref 98–111)
Creatinine, Ser: 1.21 mg/dL (ref 0.61–1.24)
GFR calc Af Amer: >60 mL/min (ref >60)
GFR calc non Af Amer: >60 mL/min (ref >60)
Glucose, Bld: 139 mg/dL — ABNORMAL HIGH (ref 70–99)
Potassium: 2.8 mmol/L — ABNORMAL LOW (ref 3.5–5.1)
Sodium: 136 mmol/L (ref 135–145)
Total Bilirubin: 2.2 mg/dL — ABNORMAL HIGH (ref 0.3–1.2)
Total Protein: 7.9 g/dL (ref 6.5–8.1)

## 2018-09-13 LAB — CBC WITH DIFFERENTIAL/PLATELET
Abs Immature Granulocytes: 0.08 10*3/uL — ABNORMAL HIGH (ref 0.00–0.07)
Basophils Absolute: 0 10*3/uL (ref 0.0–0.1)
Basophils Relative: 0 %
Eosinophils Absolute: 0 10*3/uL (ref 0.0–0.5)
Eosinophils Relative: 0 %
HCT: 40.8 % (ref 39.0–52.0)
Hemoglobin: 13.1 g/dL (ref 13.0–17.0)
Immature Granulocytes: 1 %
Lymphocytes Relative: 11 %
Lymphs Abs: 1.4 10*3/uL (ref 0.7–4.0)
MCH: 25.8 pg — ABNORMAL LOW (ref 26.0–34.0)
MCHC: 32.1 g/dL (ref 30.0–36.0)
MCV: 80.5 fL (ref 80.0–100.0)
Monocytes Absolute: 1.7 10*3/uL — ABNORMAL HIGH (ref 0.1–1.0)
Monocytes Relative: 13 %
Neutro Abs: 9.5 10*3/uL — ABNORMAL HIGH (ref 1.7–7.7)
Neutrophils Relative %: 75 %
Platelets: 268 10*3/uL (ref 150–400)
RBC: 5.07 MIL/uL (ref 4.22–5.81)
RDW: 20.3 % — ABNORMAL HIGH (ref 11.5–15.5)
WBC: 12.7 10*3/uL — ABNORMAL HIGH (ref 4.0–10.5)
nRBC: 0 % (ref 0.0–0.2)

## 2018-09-13 LAB — GRAM STAIN: Gram Stain: NONE SEEN

## 2018-09-13 LAB — URINALYSIS, COMPLETE (UACMP) WITH MICROSCOPIC
Bilirubin Urine: NEGATIVE
Glucose, UA: NEGATIVE mg/dL
Hgb urine dipstick: NEGATIVE
Ketones, ur: 20 mg/dL — AB
Nitrite: NEGATIVE
Protein, ur: 100 mg/dL — AB
Specific Gravity, Urine: 1.023 (ref 1.005–1.030)
pH: 5 (ref 5.0–8.0)

## 2018-09-13 LAB — PROTIME-INR
INR: 0.92
Prothrombin Time: 12.3 seconds (ref 11.4–15.2)

## 2018-09-13 LAB — LACTIC ACID, PLASMA
Lactic Acid, Venous: 2.5 mmol/L (ref 0.5–1.9)
Lactic Acid, Venous: 1.4 mmol/L (ref 0.5–1.9)

## 2018-09-13 MED ORDER — SODIUM CHLORIDE 0.9 % IV SOLN
Freq: Once | INTRAVENOUS | Status: AC
  Administered 2018-09-13: 21:00:00 via INTRAVENOUS

## 2018-09-13 MED ORDER — VANCOMYCIN HCL IN DEXTROSE 1-5 GM/200ML-% IV SOLN
1000.0000 mg | Freq: Once | INTRAVENOUS | Status: AC
  Administered 2018-09-14: 1000 mg via INTRAVENOUS
  Filled 2018-09-13: qty 200

## 2018-09-13 MED ORDER — POTASSIUM CHLORIDE CRYS ER 20 MEQ PO TBCR
40.0000 meq | EXTENDED_RELEASE_TABLET | Freq: Once | ORAL | Status: AC
  Administered 2018-09-14: 40 meq via ORAL
  Filled 2018-09-13: qty 2

## 2018-09-13 MED ORDER — SODIUM CHLORIDE 0.9 % IV SOLN
1.0000 g | Freq: Once | INTRAVENOUS | Status: AC
  Administered 2018-09-13: 1 g via INTRAVENOUS
  Filled 2018-09-13: qty 10

## 2018-09-13 MED ORDER — LIDOCAINE HCL (PF) 1 % IJ SOLN
5.0000 mL | Freq: Once | INTRAMUSCULAR | Status: AC
  Administered 2018-09-13: 5 mL
  Filled 2018-09-13: qty 5

## 2018-09-13 NOTE — ED Notes (Signed)
Fluid from knee walked to the lab.

## 2018-09-13 NOTE — ED Notes (Signed)
Pt to the er for pain and swelling to the left knee r/t gout. Pt said that pain started 3 days ago and pt has been taking cochicine with no relief. Pt does have swelling to the knee with inflammation present. Pt urine is dark in color. Pt reports decreased PO intake. Pt having fever at home and taken 2 aleve.

## 2018-09-13 NOTE — ED Provider Notes (Signed)
[facility] Emergency Department Provider Note   ____________________________________________   I have reviewed the triage vital signs and the nursing notes.   HISTORY  Chief Complaint Left knee pain  History limited by: Not Limited   HPI Miguel [name] is a 57 y.o. male who presents to the emergency department today because of concerns for left knee pain.  The patient states that the pain started a couple of days ago.  Has progressively gotten worse.  Has been associated with swelling to that knee.  He denies any recent significant trauma to the knee but does state he scraped it across a rug not too long ago.  The patient also started feeling chills today.  He has had decreased appetite.  He does have a history of gout and states that he started taking colchicine without any significant relief.   Per medical record review patient has a history of gout  Past Medical History:  Diagnosis Date  . Alcohol abuse   . Asthma   . GERD (gastroesophageal reflux disease)   . Gout   . Hypertension   . OCD (obsessive compulsive disorder)   . Renal colic     Patient Active Problem List   Diagnosis Date Noted  . Asthma 07/07/2018  . GERD (gastroesophageal reflux disease) 07/07/2018  . OCD (obsessive compulsive disorder) 07/07/2018  . Self-inflicted laceration of wrist 03/10/2018  . Leg hematoma 12/25/2017  . Suicide and self-inflicted injury by cutting and piercing instrument ([place]) 03/10/2017  . Severe recurrent major 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 ([place]) 03/09/2017  . Substance induced mood disorder ([place]) 08/15/2016  . Involuntary commitment 08/15/2016  . Alcohol use disorder, severe, dependence ([place]) 02/05/2016  . Hypertension 12/05/2015  . Tachycardia 12/05/2015  . Gout 11/13/2015  . Chronic back pain 05/02/2015    Past Surgical History:  Procedure Laterality Date  . CHOLECYSTECTOMY  2012    Prior to Admission medications   Medication Sig Start  Date End Date Taking? Authorizing Provider  albuterol (VENTOLIN HFA) 108 (90 Base) MCG/ACT inhaler Inhale 2 puffs into the lungs every 4 (four) hours as needed. 09/01/18   Tukov-Yual, Arlyss Gandy, NP  allopurinol (ZYLOPRIM) 300 MG tablet Take 1 tablet (300 mg total) by mouth daily. 09/01/18   Tukov-Yual, Arlyss Gandy, NP  citalopram (CELEXA) 10 MG tablet Take 1 tablet (10 mg total) by mouth at bedtime. 09/01/18   Tukov-Yual, Arlyss Gandy, NP  cloNIDine (CATAPRES) 0.1 MG tablet Take 1 tablet (0.1 mg total) by mouth 2 (two) times daily. 09/01/18   Tukov-Yual, Arlyss Gandy, NP  diphenhydrAMINE (BENADRYL) 50 MG tablet Take 1 tablet (50 mg total) by mouth at bedtime as needed for itching. 09/01/18   Tukov-Yual, Arlyss Gandy, NP  ferrous sulfate 325 (65 FE) MG tablet Take 1 tablet (325 mg total) by mouth 2 (two) times daily with a meal. 09/01/18   Tukov-Yual, Magdalene S, NP  Fluticasone-Salmeterol (ADVAIR DISKUS) 100-50 MCG/DOSE AEPB Inhale 1 puff into the lungs 2 (two) times daily. 09/01/18   Tukov-Yual, Arlyss Gandy, NP  folic acid (FOLVITE) 1 MG tablet Take 1 tablet (1 mg total) by mouth daily. 09/01/18   Tukov-Yual, Arlyss Gandy, NP  lisinopril (PRINIVIL,ZESTRIL) 20 MG tablet Take 0.5 tablets (10 mg total) by mouth daily. 09/08/18   Iloabachie, Chioma E, NP  magnesium oxide (MAG-OX) 400 (241.3 Mg) MG tablet Take 1 tablet (400 mg total) by mouth daily. 09/01/18   Tukov-Yual, Arlyss Gandy, NP  Multiple Vitamins-Minerals (MULTIVITAMIN WITH MINERALS) tablet Take 1  tablet by mouth daily. 09/01/18   Tukov-Yual, Arlyss Gandy, NP  pantoprazole (PROTONIX) 40 MG tablet Take 1 tablet (40 mg total) by mouth daily. 09/01/18   Tukov-Yual, Arlyss Gandy, NP  thiamine (VITAMIN B-1) 100 MG tablet Take 1 tablet (100 mg total) by mouth daily. 09/01/18   Tukov-Yual, Arlyss Gandy, NP  tiZANidine (ZANAFLEX) 4 MG tablet Take 1 tablet (4 mg total) by mouth every 6 (six) hours as needed for muscle spasms. 09/01/18   Tukov-Yual, Arlyss Gandy, NP    Allergies Patient has no known allergies.  Family History  Problem Relation Age of Onset  . Alcohol abuse Father     Social History Social History   Tobacco Use  . Smoking status: Former Research scientist (life sciences)  . Smokeless tobacco: Never Used  . Tobacco comment: quit 30 years ago  Substance Use Topics  . Alcohol use: Not Currently    Alcohol/week: 7.0 standard drinks    Types: 7 Glasses of wine per week    Comment: Pt states, "I quit all the hard stuff 4 months ago"  . Drug use: No    Review of Systems Constitutional: No fever/chills Eyes: No visual changes. ENT: No sore throat. Cardiovascular: Denies chest pain. Respiratory: Denies shortness of breath. Gastrointestinal: No abdominal pain.  No nausea, no vomiting.  No diarrhea.   Genitourinary: Negative for dysuria. Musculoskeletal: Positive for left knee pain.  Skin: Negative for rash. Neurological: Negative for headaches, focal weakness or numbness.  ____________________________________________   PHYSICAL EXAM:  VITAL SIGNS: ED Triage Vitals  Enc Vitals Group     BP 09/13/18 2025 (!) 163/79     Pulse Rate 09/13/18 2025 (!) 110     Resp 09/13/18 2025 20     Temp 09/13/18 2025 (!) 100.6 F (38.1 C)     Temp Source 09/13/18 2025 Oral     SpO2 09/13/18 2025 100 %     Weight 09/13/18 2027 189 lb (85.7 kg)     Height 09/13/18 2027 6' (1.829 m)     Head Circumference --      Peak Flow --      Pain Score 09/13/18 2027 10   Constitutional: Alert and oriented.  Eyes: Conjunctivae are normal.  ENT      Head: Normocephalic and atraumatic.      Nose: No congestion/rhinnorhea.      Mouth/Throat: Mucous membranes are moist.      Neck: No stridor. Hematological/Lymphatic/Immunilogical: No cervical lymphadenopathy. Cardiovascular: Normal rate, regular rhythm.  No murmurs, rubs, or gallops. Respiratory: Normal respiratory effort without tachypnea nor retractions. Breath sounds are clear and equal bilaterally. No  wheezes/rales/rhonchi. Gastrointestinal: Soft and non tender. No rebound. No guarding.  Genitourinary: Deferred Musculoskeletal: Left knee with swelling, warmth. No erythema.  Neurologic:  Normal speech and language. No gross focal neurologic deficits are appreciated.  Skin:  Skin is warm, dry and intact. No rash noted. Psychiatric: Mood and affect are normal. Speech and behavior are normal. Patient exhibits appropriate insight and judgment.  ____________________________________________    LABS (pertinent positives/negatives)  Lactic 2.5->1.4 UA hazy, small leukocytes, 11-20 wbc CBC wbc 12.7, hgb 13.1, plt 268 CMP na 136, k 2.8, glu 139, cr 1.21  ____________________________________________   EKG  None  ____________________________________________    RADIOLOGY  None   ____________________________________________   PROCEDURES  Procedures  ARTHOCENTESIS Performed by: Nance Pear Consent: Verbal consent obtained. Risks and benefits: risks, benefits and alternatives were discussed Consent given by: patient Required items: required blood products, implants,  devices, and special equipment available Patient identity confirmed: verbally with patient Time out: Immediately prior to procedure a "time out" was called to verify the correct patient, procedure, equipment, support staff and site/side marked as required. Indications: left knee swelling, pain Joint: left knee Local anesthesia used: 1% lidocaine Preparation: Patient was prepped and draped in the usual sterile fashion. Aspirate appearance: yellow, cloudy Aspirate amount: 15 ml Patient tolerance: Patient tolerated the procedure well with no immediate complications.    ____________________________________________   INITIAL IMPRESSION / ASSESSMENT AND PLAN / ED COURSE  Pertinent labs & imaging results that were available during my care of the patient were reviewed by me and considered in my medical  decision making (see chart for details).   Patient presented to the emergency department today because of concerns for left knee swelling and pain.  Patient does have a history of gout however states that it is not responding to the colchicine as his gout normally does.  Patient was noted to be febrile.  Patient also found to have a lactic acidosis and elevated white count.  Given concern for possible septic joint did consent the patient for knee aspirate.  Patient tolerated this procedure without any complications.  Patient was given broad-spectrum antibiotics. Knee aspirate results pending at time of sign out.   ____________________________________________   FINAL CLINICAL IMPRESSION(S) / ED DIAGNOSES  Left knee pain   Note: This dictation was prepared with Dragon dictation. Any transcriptional errors that result from this process are unintentional     Nance Pear, MD 09/14/18 1512

## 2018-09-13 NOTE — ED Triage Notes (Signed)
Pt brought in via ems from home .  Pt has left knee pain and swelling   Hx gout.  Sx for 3 days.  Pt taking gout meds without pain relief.  Pt alert.

## 2018-09-13 NOTE — ED Notes (Signed)
MD in to tap the left knee. [name] and lidocaine at bedside.

## 2018-09-14 ENCOUNTER — Encounter: Payer: Self-pay | Admitting: Internal Medicine

## 2018-09-14 ENCOUNTER — Inpatient Hospital Stay: Payer: Self-pay

## 2018-09-14 ENCOUNTER — Other Ambulatory Visit: Payer: Self-pay

## 2018-09-14 DIAGNOSIS — N390 Urinary tract infection, site not specified: Secondary | ICD-10-CM

## 2018-09-14 DIAGNOSIS — A419 Sepsis, unspecified organism: Secondary | ICD-10-CM | POA: Diagnosis present

## 2018-09-14 LAB — PROCALCITONIN: Procalcitonin: 0.11 ng/mL

## 2018-09-14 LAB — BODY FLUID CELL COUNT WITH DIFFERENTIAL
Eos, Fluid: 0 %
Lymphs, Fluid: 0 %
Monocyte-Macrophage-Serous Fluid: 4 %
Neutrophil Count, Fluid: 96 %
Other Cells, Fluid: 0 %
Total Nucleated Cell Count, Fluid: 12404 cu mm

## 2018-09-14 LAB — CBC
HCT: 32.4 % — ABNORMAL LOW (ref 39.0–52.0)
Hemoglobin: 10 g/dL — ABNORMAL LOW (ref 13.0–17.0)
MCH: 25.4 pg — ABNORMAL LOW (ref 26.0–34.0)
MCHC: 30.9 g/dL (ref 30.0–36.0)
MCV: 82.4 fL (ref 80.0–100.0)
Platelets: 220 10*3/uL (ref 150–400)
RBC: 3.93 MIL/uL — ABNORMAL LOW (ref 4.22–5.81)
RDW: 20.1 % — ABNORMAL HIGH (ref 11.5–15.5)
WBC: 9.4 10*3/uL (ref 4.0–10.5)
nRBC: 0 % (ref 0.0–0.2)

## 2018-09-14 LAB — BASIC METABOLIC PANEL
Anion gap: 8 (ref 5–15)
BUN: 9 mg/dL (ref 6–20)
CO2: 27 mmol/L (ref 22–32)
Calcium: 8.1 mg/dL — ABNORMAL LOW (ref 8.9–10.3)
Chloride: 102 mmol/L (ref 98–111)
Creatinine, Ser: 1.25 mg/dL — ABNORMAL HIGH (ref 0.61–1.24)
GFR calc Af Amer: >60 mL/min (ref >60)
GFR calc non Af Amer: >60 mL/min (ref >60)
Glucose, Bld: 151 mg/dL — ABNORMAL HIGH (ref 70–99)
Potassium: 3.3 mmol/L — ABNORMAL LOW (ref 3.5–5.1)
Sodium: 137 mmol/L (ref 135–145)

## 2018-09-14 LAB — GLUCOSE, CAPILLARY: Glucose-Capillary: 154 mg/dL — ABNORMAL HIGH (ref 70–99)

## 2018-09-14 LAB — PHOSPHORUS: Phosphorus: 1.4 mg/dL — ABNORMAL LOW (ref 2.5–4.6)

## 2018-09-14 LAB — ETHANOL: Alcohol, Ethyl (B): <10 mg/dL (ref <10)

## 2018-09-14 LAB — MAGNESIUM: Magnesium: 1.4 mg/dL — ABNORMAL LOW (ref 1.7–2.4)

## 2018-09-14 MED ORDER — DIPHENHYDRAMINE HCL 50 MG/ML IJ SOLN
12.5000 mg | Freq: Once | INTRAMUSCULAR | Status: AC
  Administered 2018-09-14: 12.5 mg via INTRAVENOUS
  Filled 2018-09-14: qty 1

## 2018-09-14 MED ORDER — IOPAMIDOL (ISOVUE-300) INJECTION 61%
30.0000 mL | Freq: Once | INTRAVENOUS | Status: AC | PRN
  Administered 2018-09-14: 30 mL via ORAL

## 2018-09-14 MED ORDER — PREDNISONE 20 MG PO TABS
40.0000 mg | ORAL_TABLET | Freq: Every day | ORAL | Status: DC
  Administered 2018-09-14 – 2018-09-15 (×2): 40 mg via ORAL
  Filled 2018-09-14 (×2): qty 2

## 2018-09-14 MED ORDER — CLONIDINE HCL 0.1 MG PO TABS
0.1000 mg | ORAL_TABLET | Freq: Two times a day (BID) | ORAL | Status: DC
  Administered 2018-09-14 – 2018-09-15 (×3): 0.1 mg via ORAL
  Filled 2018-09-14 (×3): qty 1

## 2018-09-14 MED ORDER — ONDANSETRON HCL 4 MG/2ML IJ SOLN: 4.0000 mg | Freq: Four times a day (QID) | INTRAMUSCULAR | Status: DC | PRN

## 2018-09-14 MED ORDER — FERROUS SULFATE 325 (65 FE) MG PO TABS
325.0000 mg | ORAL_TABLET | Freq: Two times a day (BID) | ORAL | Status: DC
  Administered 2018-09-14 – 2018-09-15 (×3): 325 mg via ORAL
  Filled 2018-09-14 (×3): qty 1

## 2018-09-14 MED ORDER — CITALOPRAM HYDROBROMIDE 20 MG PO TABS
10.0000 mg | ORAL_TABLET | Freq: Every day | ORAL | Status: DC
  Administered 2018-09-14: 10 mg via ORAL
  Filled 2018-09-14: qty 1

## 2018-09-14 MED ORDER — POTASSIUM PHOSPHATES 15 MMOLE/5ML IV SOLN
30.0000 mmol | Freq: Once | INTRAVENOUS | Status: AC
  Administered 2018-09-14: 30 mmol via INTRAVENOUS
  Filled 2018-09-14: qty 10

## 2018-09-14 MED ORDER — ALBUTEROL SULFATE (2.5 MG/3ML) 0.083% IN NEBU: 2.5000 mg | INHALATION_SOLUTION | Freq: Four times a day (QID) | RESPIRATORY_TRACT | Status: DC | PRN

## 2018-09-14 MED ORDER — DIPHENHYDRAMINE HCL 25 MG PO CAPS
50.0000 mg | ORAL_CAPSULE | Freq: Every evening | ORAL | Status: DC | PRN
  Filled 2018-09-14: qty 2

## 2018-09-14 MED ORDER — BISACODYL 5 MG PO TBEC: 5.0000 mg | DELAYED_RELEASE_TABLET | Freq: Every day | ORAL | Status: DC | PRN

## 2018-09-14 MED ORDER — SODIUM CHLORIDE 0.9 % IV SOLN
1.0000 g | INTRAVENOUS | Status: DC
  Administered 2018-09-14: 1 g via INTRAVENOUS
  Filled 2018-09-14: qty 10
  Filled 2018-09-14: qty 1

## 2018-09-14 MED ORDER — LISINOPRIL 10 MG PO TABS
10.0000 mg | ORAL_TABLET | Freq: Every day | ORAL | Status: DC
  Administered 2018-09-14 – 2018-09-15 (×2): 10 mg via ORAL
  Filled 2018-09-14 (×2): qty 1

## 2018-09-14 MED ORDER — IOHEXOL 300 MG/ML  SOLN
100.0000 mL | Freq: Once | INTRAMUSCULAR | Status: AC | PRN
  Administered 2018-09-14: 100 mL via INTRAVENOUS

## 2018-09-14 MED ORDER — ACETAMINOPHEN 650 MG RE SUPP: 650.0000 mg | Freq: Four times a day (QID) | RECTAL | Status: DC | PRN

## 2018-09-14 MED ORDER — ADULT MULTIVITAMIN W/MINERALS CH
1.0000 | ORAL_TABLET | Freq: Every day | ORAL | Status: DC
  Administered 2018-09-14 – 2018-09-15 (×2): 1 via ORAL
  Filled 2018-09-14 (×2): qty 1

## 2018-09-14 MED ORDER — LACTATED RINGERS IV SOLN
INTRAVENOUS | Status: DC
  Administered 2018-09-14 – 2018-09-15 (×3): via INTRAVENOUS

## 2018-09-14 MED ORDER — ENOXAPARIN SODIUM 40 MG/0.4ML ~~LOC~~ SOLN
40.0000 mg | SUBCUTANEOUS | Status: DC
  Administered 2018-09-14: 40 mg via SUBCUTANEOUS
  Filled 2018-09-14: qty 0.4

## 2018-09-14 MED ORDER — PANTOPRAZOLE SODIUM 40 MG PO TBEC
40.0000 mg | DELAYED_RELEASE_TABLET | Freq: Every day | ORAL | Status: DC
  Administered 2018-09-14 – 2018-09-15 (×2): 40 mg via ORAL
  Filled 2018-09-14 (×2): qty 1

## 2018-09-14 MED ORDER — VITAMIN B-1 100 MG PO TABS
100.0000 mg | ORAL_TABLET | Freq: Every day | ORAL | Status: DC
  Administered 2018-09-14 – 2018-09-15 (×2): 100 mg via ORAL
  Filled 2018-09-14 (×2): qty 1

## 2018-09-14 MED ORDER — FOLIC ACID 1 MG PO TABS
1.0000 mg | ORAL_TABLET | Freq: Every day | ORAL | Status: DC
  Administered 2018-09-14 – 2018-09-15 (×2): 1 mg via ORAL
  Filled 2018-09-14 (×2): qty 1

## 2018-09-14 MED ORDER — MOMETASONE FURO-FORMOTEROL FUM 100-5 MCG/ACT IN AERO
2.0000 | INHALATION_SPRAY | Freq: Two times a day (BID) | RESPIRATORY_TRACT | Status: DC
  Administered 2018-09-14 – 2018-09-15 (×3): 2 via RESPIRATORY_TRACT
  Filled 2018-09-14: qty 8.8

## 2018-09-14 MED ORDER — SENNOSIDES-DOCUSATE SODIUM 8.6-50 MG PO TABS: 1.0000 | ORAL_TABLET | Freq: Every evening | ORAL | Status: DC | PRN

## 2018-09-14 MED ORDER — ACETAMINOPHEN 325 MG PO TABS: 650.0000 mg | ORAL_TABLET | Freq: Four times a day (QID) | ORAL | Status: DC | PRN

## 2018-09-14 MED ORDER — MAGNESIUM SULFATE 2 GM/50ML IV SOLN
2.0000 g | INTRAVENOUS | Status: AC
  Administered 2018-09-14 (×2): 2 g via INTRAVENOUS
  Filled 2018-09-14 (×2): qty 50

## 2018-09-14 MED ORDER — ALLOPURINOL 300 MG PO TABS
300.0000 mg | ORAL_TABLET | Freq: Every day | ORAL | Status: DC
  Administered 2018-09-14 – 2018-09-15 (×2): 300 mg via ORAL
  Filled 2018-09-14 (×2): qty 1

## 2018-09-14 MED ORDER — HYDROMORPHONE HCL 1 MG/ML IJ SOLN
0.5000 mg | INTRAMUSCULAR | Status: DC | PRN
  Administered 2018-09-14 (×4): 1 mg via INTRAVENOUS
  Administered 2018-09-14: 0.5 mg via INTRAVENOUS
  Administered 2018-09-14 – 2018-09-15 (×2): 1 mg via INTRAVENOUS
  Administered 2018-09-15: 0.5 mg via INTRAVENOUS
  Administered 2018-09-15: 1 mg via INTRAVENOUS
  Filled 2018-09-14 (×9): qty 1

## 2018-09-14 NOTE — H&P (Signed)
Miguel [name] at [place] NAME: Miguel [name]    MR#:  [id number]  DATE OF BIRTH:  17-Feb-1961  DATE OF ADMISSION:  09/13/2018  PRIMARY CARE PHYSICIAN: System, Pcp Not In   REQUESTING/REFERRING PHYSICIAN: Gregor Hams, MD  CHIEF COMPLAINT:   Chief Complaint  Patient presents with  . Gout  . Knee Pain    HISTORY OF PRESENT ILLNESS:  Miguel [name]  is a 57 y.o. male with a known history of EtOH abuse/dependence, hypertension, GERD, gout p/w generalized weakness. He seems very focused on musculoskeletal pain symptoms, and he is a vague and rather inadequate historian. I am much more concerned with his septic-type presentation. He appears toxic and diaphoretic, and is febrile (T 38.1) and tachycardic in the ED. SIRS (+). WBC 12.7, Lactate 2.5 (improved to 1.4). Suspected sepsis. Potential sources of sepsis include urinary tract, gastrointestinal tract or L knee. His workup is presently incomplete. He is awaiting CT imaging and stool studies for identification of infectious source. BCx, UCx, PCT pending as well. Urinalysis is (+) leukocyte esterase, protein, bacteria and WBC. He endorses crampy lower abdominal pain/discomfort and diarrhea x1wk, as well as fatigue, malaise, generalized weakness, diaphoresis, chills and nausea. He endorses poor PO intake and dehydration. He denies fever and vomiting. He denies cough or urinary symptoms (burning, pain, dysuria, hematuria, pyuria, frequency, urgency, nocturia, hesitancy, incontinence). He endorses acute worsening of neck, (upper and lower) back and (B/L) shoulder pain. He endorses L knee pain since Sunday, suspected gout; L knee arthrocentesis performed in ED. States he had a flu shot 3wks ago. Denies recent (48hr+ inpt) hospitalization. Denies ABx use prior to onset of diarrhea.  PAST MEDICAL HISTORY:   Past Medical History:  Diagnosis Date  . Alcohol abuse   . Asthma   . GERD (gastroesophageal reflux  disease)   . Gout   . Hypertension   . OCD (obsessive compulsive disorder)   . Renal colic     PAST SURGICAL HISTORY:   Past Surgical History:  Procedure Laterality Date  . CHOLECYSTECTOMY  2012    SOCIAL HISTORY:   Social History   Tobacco Use  . Smoking status: Former Research scientist (life sciences)  . Smokeless tobacco: Never Used  . Tobacco comment: quit 30 years ago  Substance Use Topics  . Alcohol use: Not Currently    Alcohol/week: 7.0 standard drinks    Types: 7 Glasses of wine per week    Comment: Pt states, "I quit all the hard stuff 4 months ago"    FAMILY HISTORY:   Family History  Problem Relation Age of Onset  . Alcohol abuse Father     DRUG ALLERGIES:  No Known Allergies  REVIEW OF SYSTEMS:   Review of Systems  Constitutional: Positive for chills, diaphoresis and malaise/fatigue. Negative for fever and weight loss.  HENT: Negative for congestion, ear pain, hearing loss, nosebleeds, sinus pain, sore throat and tinnitus.   Eyes: Negative for blurred vision, double vision and photophobia.  Respiratory: Negative for cough, hemoptysis, sputum production, shortness of breath and wheezing.   Cardiovascular: Negative for chest pain, palpitations, orthopnea, claudication, leg swelling and PND.  Gastrointestinal: Positive for abdominal pain, diarrhea and nausea. Negative for blood in stool, constipation, heartburn, melena and vomiting.  Genitourinary: Negative for dysuria, flank pain, frequency, hematuria and urgency.  Musculoskeletal: Positive for back pain, joint pain and neck pain. Negative for falls and myalgias.  Skin: Negative for itching and rash.  Neurological: Positive for weakness.  Negative for dizziness, tingling, tremors, sensory change, speech change, focal weakness, seizures, loss of consciousness and headaches.  Psychiatric/Behavioral: Negative for memory loss. The patient does not have insomnia.    MEDICATIONS AT HOME:   Prior to Admission medications   Medication  Sig Start Date End Date Taking? Authorizing Provider  albuterol (VENTOLIN HFA) 108 (90 Base) MCG/ACT inhaler Inhale 2 puffs into the lungs every 4 (four) hours as needed. 09/01/18  Yes Tukov-Yual, Arlyss Gandy, NP  allopurinol (ZYLOPRIM) 300 MG tablet Take 1 tablet (300 mg total) by mouth daily. 09/01/18  Yes Tukov-Yual, Arlyss Gandy, NP  citalopram (CELEXA) 10 MG tablet Take 1 tablet (10 mg total) by mouth at bedtime. 09/01/18  Yes Tukov-Yual, Arlyss Gandy, NP  cloNIDine (CATAPRES) 0.1 MG tablet Take 1 tablet (0.1 mg total) by mouth 2 (two) times daily. 09/01/18  Yes Tukov-Yual, Arlyss Gandy, NP  diphenhydrAMINE (BENADRYL) 50 MG tablet Take 1 tablet (50 mg total) by mouth at bedtime as needed for itching. 09/01/18  Yes Tukov-Yual, Arlyss Gandy, NP  ferrous sulfate 325 (65 FE) MG tablet Take 1 tablet (325 mg total) by mouth 2 (two) times daily with a meal. 09/01/18  Yes Tukov-Yual, Magdalene S, NP  Fluticasone-Salmeterol (ADVAIR DISKUS) 100-50 MCG/DOSE AEPB Inhale 1 puff into the lungs 2 (two) times daily. 09/01/18  Yes Tukov-Yual, Arlyss Gandy, NP  folic acid (FOLVITE) 1 MG tablet Take 1 tablet (1 mg total) by mouth daily. 09/01/18  Yes Tukov-Yual, Magdalene S, NP  lisinopril (PRINIVIL,ZESTRIL) 20 MG tablet Take 0.5 tablets (10 mg total) by mouth daily. 09/08/18  Yes Iloabachie, Chioma E, NP  magnesium oxide (MAG-OX) 400 (241.3 Mg) MG tablet Take 1 tablet (400 mg total) by mouth daily. 09/01/18  Yes Tukov-Yual, Arlyss Gandy, NP  Multiple Vitamins-Minerals (MULTIVITAMIN WITH MINERALS) tablet Take 1 tablet by mouth daily. 09/01/18  Yes Tukov-Yual, Magdalene S, NP  pantoprazole (PROTONIX) 40 MG tablet Take 1 tablet (40 mg total) by mouth daily. 09/01/18  Yes Tukov-Yual, Magdalene S, NP  thiamine (VITAMIN B-1) 100 MG tablet Take 1 tablet (100 mg total) by mouth daily. 09/01/18  Yes Tukov-Yual, Magdalene S, NP  tiZANidine (ZANAFLEX) 4 MG tablet Take 1 tablet (4 mg total) by mouth every 6 (six) hours as needed for  muscle spasms. 09/01/18  Yes Tukov-Yual, Magdalene S, NP      VITAL SIGNS:  Blood pressure (!) 183/89, pulse 89, temperature 98.8 F (37.1 C), temperature source Oral, resp. rate 17, height 6' (1.829 m), weight 85.7 kg, SpO2 100 %.  PHYSICAL EXAMINATION:  Physical Exam  Constitutional: He is oriented to person, place, and time. He appears well-developed and well-nourished. He is active and cooperative. He appears toxic. He has a sickly appearance. He appears ill. No distress. He is not intubated. Nasal cannula in place.  HENT:  Head: Atraumatic.  Mouth/Throat: Oropharynx is clear and moist. No oropharyngeal exudate.  Eyes: Conjunctivae, EOM and lids are normal. No scleral icterus.  Neck: Neck supple. No JVD present. No thyromegaly present.  Cardiovascular: Normal rate, regular rhythm, S1 normal, S2 normal and normal heart sounds.  No extrasystoles are present. Exam reveals no gallop, no S3, no S4, no distant heart sounds and no friction rub.  No murmur heard. Pulmonary/Chest: Effort normal. No accessory muscle usage or stridor. No apnea, no tachypnea and no bradypnea. He is not intubated. No respiratory distress. He has decreased breath sounds in the right upper field, the right middle field, the right lower field, the left upper field,  the left middle field and the left lower field. He has no wheezes. He has no rhonchi. He has no rales.  Abdominal: Soft. He exhibits no distension. Bowel sounds are decreased. There is tenderness in the right lower quadrant, suprapubic area and left lower quadrant. There is no rigidity, no rebound and no guarding.  Musculoskeletal: Normal range of motion. He exhibits no edema or tenderness.  Lymphadenopathy:    He has no cervical adenopathy.  Neurological: He is alert and oriented to person, place, and time. He is not disoriented.  Skin: Skin is warm and dry. No rash noted. He is not diaphoretic. No erythema. No pallor.  Psychiatric: He has a normal mood and  affect. His speech is normal and behavior is normal. Judgment and thought content normal. Cognition and memory are normal.    LABORATORY PANEL:   CBC Recent Labs  Lab 09/13/18 2050  WBC 12.7*  HGB 13.1  HCT 40.8  PLT 268   ------------------------------------------------------------------------------------------------------------------  Chemistries  Recent Labs  Lab 09/13/18 2050  NA 136  K 2.8*  CL 101  CO2 21*  GLUCOSE 139*  BUN 10  CREATININE 1.21  CALCIUM 9.2  MG 1.4*  AST 17  ALT 9  ALKPHOS 70  BILITOT 2.2*   ------------------------------------------------------------------------------------------------------------------  Cardiac Enzymes No results for input(s): TROPONINI in the last 168 hours. ------------------------------------------------------------------------------------------------------------------  RADIOLOGY:  Dg Chest 2 View  Result Date: 09/14/2018 CLINICAL DATA:  57 year old male with fever. EXAM: CHEST - 2 VIEW COMPARISON:  Chest radiograph dated 04/01/2018 FINDINGS: The heart size and mediastinal contours are within normal limits. Both lungs are clear. The visualized skeletal structures are unremarkable. IMPRESSION: No active cardiopulmonary disease. Electronically Signed   By: Anner Crete M.D.   On: 09/14/2018 02:20   IMPRESSION AND PLAN:   A/P: 9M fever of unknown origin, tachycardia, leukocytosis, SIRS (+), suspected sepsis, source unclear (urinary vs. GI vs. joint source). Poor PO intake, dehydration, creatinine elevation, hypokalemia, hypomagnesemia, hypophosphatemia, mild hyperbilirubinemia, lactate elevation. -Fever of unknown origin, tachycardia, leukocytosis, SIRS (+), lactate elevation, suspected sepsis: Pt p/w septic-type appearance, toxic/diaphoretic. SIRS (+), febrile (T 38.1). Lactate 2.5 (improved to 1.4). CXR (-) acute cardiopulmonary process. U/A (+) leukocyte esterase, protein, bacteria, WBC. BCx, UCx, PCT, stool studies, CT  C/A/P pending. Received Vancomycin + Ceftriaxone in ED, the latter of which has been continued, as the urine is the only potential infectious source I have identified thus far. Cardiac monitoring, pulse ox. IVF. -Poor PO intake, dehydration, creatinine elevation, hypokalemia, hypomagnesemia, hypophosphatemia, mild hyperbilirubinemia: Endorses poor PO intake, dehydration. Cr 1.21 on admission, baseline 0.9-1.0. Intravascular volume depletion, (-) AKI. IVF, monitor BMP, avoid nephrotoxins. Replete K+, Mag, Phos. -c/w home meds. -FEN/GI: Cardiac diet. -DVT PPx: Lovenox. -Code status: Full code. -Disposition: Admission, > 2 midnights.   All the records are reviewed and case discussed with ED provider. Management plans discussed with the patient, family and they are in agreement.  CODE STATUS: Full code.  TOTAL TIME TAKING CARE OF THIS PATIENT: 75 minutes.    Arta Silence M.D on 09/14/2018 at 3:10 AM  Between 7am to 6pm - Pager - [phone]  After 6pm go to [email or web address] - Proofreader  [facility]  Office  [phone]  CC: Primary care physician; System, Pcp Not In   Note: This dictation was prepared with Dragon dictation along with smaller phrase technology. Any transcriptional errors that result from this process are unintentional.

## 2018-09-14 NOTE — Progress Notes (Signed)
Patient admitted overnight.  Seen and examined by me this morning.  Mostly complaining of left knee pain.  States that his left knee is swollen and hot, and feels like a typical gout flare for him.  On exam, he has mild tenderness to palpation of his lower abdomen.  Left knee is warm to the touch but without obvious erythema.  Left knee is diffusely tender to palpation.  -Agree with admission H&P -Continue IV ceftriaxone for presumed UTI and will follow up urine cultures -Add prednisone for presumed gout flare of left knee.  Colchicine has not helped as an outpatient. -Fluid studies from left knee without signs of bacterial infection -Stool studies pending for diarrhea  Hyman Bible, MD

## 2018-09-14 NOTE — Plan of Care (Signed)
  Problem: Elimination: Goal: Will not experience complications related to urinary retention Outcome: Progressing   Problem: Pain Managment: Goal: General experience of comfort will improve Outcome: Progressing   Problem: Safety: Goal: Ability to remain free from injury will improve Outcome: Progressing   

## 2018-09-14 NOTE — ED Notes (Signed)
ED TO INPATIENT HANDOFF REPORT  Name/Age/Gender Miguel [name] 57 y.o. male  Code Status Code Status History    Date Active Date Inactive Code Status Order ID Comments User Context   12/25/2017 1405 12/29/2017 1856 Full Code [id number]  Gladstone Lighter, MD Inpatient   03/09/2017 2107 03/12/2017 1506 Full Code [id number]  Gonzella Lex, MD Inpatient   08/15/2016 0009 08/15/2016 1528 Full Code [id number]  Nena Polio, MD ED      Home/SNF/Other Home  Chief Complaint Left Leg Pain  EMS  Level of Care/Admitting Diagnosis ED Disposition    ED Disposition Condition [place]: [facility] [100120]  Level of Care: Telemetry [5]  Diagnosis: Sepsis secondary to UTI [facility]) [195093]  Admitting Physician: Arta Silence [[id number]]  Attending Physician: Arta Silence [[id number]]  Estimated length of stay: past midnight tomorrow  Certification:: I certify this patient will need inpatient services for at least 2 midnights  PT Class (Do Not Modify): Inpatient [101]  PT Acc Code (Do Not Modify): Private [1]       Medical History Past Medical History:  Diagnosis Date  . Alcohol abuse   . Asthma   . GERD (gastroesophageal reflux disease)   . Gout   . Hypertension   . OCD (obsessive compulsive disorder)   . Renal colic     Allergies No Known Allergies  IV Location/Drains/Wounds Patient Lines/Drains/Airways Status   Active Line/Drains/Airways    Name:   Placement date:   Placement time:   Site:   Days:   Peripheral IV 09/13/18 Right Antecubital   09/13/18    2050    Antecubital   1   Wound / Incision (Open or Dehisced) Leg Right   -    -    Leg             Labs/Imaging Results for orders placed or performed during the hospital encounter of 09/13/18 (from the past 48 hour(s))  Comprehensive metabolic panel     Status: Abnormal   Collection Time: 09/13/18  8:50 PM  Result Value Ref Range   Sodium 136 135 - 145 mmol/L    Potassium 2.8 (L) 3.5 - 5.1 mmol/L   Chloride 101 98 - 111 mmol/L   CO2 21 (L) 22 - 32 mmol/L   Glucose, Bld 139 (H) 70 - 99 mg/dL   BUN 10 6 - 20 mg/dL   Creatinine, Ser 1.21 0.61 - 1.24 mg/dL   Calcium 9.2 8.9 - 10.3 mg/dL   Total Protein 7.9 6.5 - 8.1 g/dL   Albumin 4.7 3.5 - 5.0 g/dL   AST 17 15 - 41 U/L   ALT 9 0 - 44 U/L   Alkaline Phosphatase 70 38 - 126 U/L   Total Bilirubin 2.2 (H) 0.3 - 1.2 mg/dL   GFR calc non Af Amer >60 >60 mL/min   GFR calc Af Amer >60 >60 mL/min    Comment: (NOTE) The eGFR has been calculated using the CKD EPI equation. This calculation has not been validated in all clinical situations. eGFR's persistently <60 mL/min signify possible Chronic Kidney Disease.    Anion gap 14 5 - 15    Comment: Performed at [facility], [address] [place], [place] [address]  Lactic acid, plasma     Status: Abnormal   Collection Time: 09/13/18  8:50 PM  Result Value Ref Range   Lactic Acid, Venous 2.5 (HH) 0.5 - 1.9 mmol/L  Comment: CRITICAL RESULT CALLED TO, READ BACK BY AND VERIFIED WITH GREG MOYER @2137  09/13/18 FLC Performed at [facility], [address] [place], [place] [address]   CBC with Differential     Status: Abnormal   Collection Time: 09/13/18  8:50 PM  Result Value Ref Range   WBC 12.7 (H) 4.0 - 10.5 K/uL   RBC 5.07 4.22 - 5.81 MIL/uL   Hemoglobin 13.1 13.0 - 17.0 g/dL   HCT 40.8 39.0 - 52.0 %   MCV 80.5 80.0 - 100.0 fL   MCH 25.8 (L) 26.0 - 34.0 pg   MCHC 32.1 30.0 - 36.0 g/dL   RDW 20.3 (H) 11.5 - 15.5 %   Platelets 268 150 - 400 K/uL   nRBC 0.0 0.0 - 0.2 %   Neutrophils Relative % 75 %   Neutro Abs 9.5 (H) 1.7 - 7.7 K/uL   Lymphocytes Relative 11 %   Lymphs Abs 1.4 0.7 - 4.0 K/uL   Monocytes Relative 13 %   Monocytes Absolute 1.7 (H) 0.1 - 1.0 K/uL   Eosinophils Relative 0 %   Eosinophils Absolute 0.0 0.0 - 0.5 K/uL   Basophils Relative 0 %   Basophils Absolute 0.0 0.0 - 0.1 K/uL   Immature Granulocytes 1  %   Abs Immature Granulocytes 0.08 (H) 0.00 - 0.07 K/uL    Comment: Performed at [facility], [address] [place], [place] [address]  Protime-INR     Status: None   Collection Time: 09/13/18  8:50 PM  Result Value Ref Range   Prothrombin Time 12.3 11.4 - 15.2 seconds   INR 0.92     Comment: Performed at [facility], [address] [place], [place] [address]  Urinalysis, Complete w Microscopic     Status: Abnormal   Collection Time: 09/13/18  8:50 PM  Result Value Ref Range   Color, Urine AMBER (A) YELLOW    Comment: BIOCHEMICALS MAY BE AFFECTED BY COLOR   APPearance HAZY (A) CLEAR   Specific Gravity, Urine 1.023 1.005 - 1.030   pH 5.0 5.0 - 8.0   Glucose, UA NEGATIVE NEGATIVE mg/dL   Hgb urine dipstick NEGATIVE NEGATIVE   Bilirubin Urine NEGATIVE NEGATIVE   Ketones, ur 20 (A) NEGATIVE mg/dL   Protein, ur 100 (A) NEGATIVE mg/dL   Nitrite NEGATIVE NEGATIVE   Leukocytes, UA SMALL (A) NEGATIVE   RBC / HPF 0-5 0 - 5 RBC/hpf   WBC, UA 11-20 0 - 5 WBC/hpf   Bacteria, UA RARE (A) NONE SEEN   Squamous Epithelial / LPF 0-5 0 - 5   Mucus PRESENT    Hyaline Casts, UA PRESENT     Comment: Performed at [facility], [address], [place], [place] [address]  Magnesium     Status: Abnormal   Collection Time: 09/13/18  8:50 PM  Result Value Ref Range   Magnesium 1.4 (L) 1.7 - 2.4 mg/dL    Comment: Performed at [facility], [address] [place], [place] [address]  Phosphorus     Status: Abnormal   Collection Time: 09/13/18  8:50 PM  Result Value Ref Range   Phosphorus 1.4 (L) 2.5 - 4.6 mg/dL    Comment: Performed at [facility], [address] [place],  [address]  Ethanol     Status: None   Collection Time: 09/13/18  8:50 PM  Result Value Ref Range   Alcohol, Ethyl (B) <10 <10 mg/dL    Comment: (NOTE)  Lowest detectable limit for serum alcohol is 10 mg/dL. For medical purposes only. Performed at [facility], [address] [place], [place] [address]   Body fluid cell count with differential     Status: Abnormal   Collection Time: 09/13/18 10:08 PM  Result Value Ref Range   Fluid Type-FCT SYNOVIAL    Color, Fluid YELLOW (A) YELLOW   Appearance, Fluid CLOUDY (A) CLEAR   WBC, Fluid 12,404 cu mm   Neutrophil Count, Fluid 96 %   Lymphs, Fluid 0 %   Monocyte-Macrophage-Serous Fluid 4 %   Eos, Fluid 0 %   Other Cells, Fluid 0 %    Comment: Performed at [facility], [address] [place], [place] [address]  Gram stain     Status: None   Collection Time: 09/13/18 10:08 PM  Result Value Ref Range   Specimen Description KNEE    Special Requests NONE    Gram Stain      NO ORGANISMS SEEN MANY WBC SEEN Performed at [facility], [address], [place], [place] [address]    Report Status 09/13/2018 FINAL   Body fluid culture     Status: None (Preliminary result)   Collection Time: 09/13/18 10:08 PM  Result Value Ref Range   Specimen Description      KNEE Performed at [facility], [address] [place], [place] [address]    Special Requests      NONE Performed at [facility], [address], [place], [place] [address]    Gram Stain      ABUNDANT WBC PRESENT, PREDOMINANTLY PMN NO ORGANISMS SEEN Performed at [facility], [place] [address], [place], [place] [address]    Culture PENDING    Report Status PENDING   Lactic acid, plasma     Status: None   Collection Time: 09/13/18 10:33 PM  Result Value Ref Range   Lactic Acid, Venous 1.4 0.5 - 1.9 mmol/L    Comment: Performed at [facility], [address], [place], [place] [address]   Dg Chest 2 View  Result Date: 09/14/2018 CLINICAL DATA:  57 year old male with fever. EXAM: CHEST - 2 VIEW COMPARISON:  Chest radiograph dated 04/01/2018 FINDINGS: The heart size and mediastinal contours are within normal limits. Both lungs are clear. The visualized skeletal structures are  unremarkable. IMPRESSION: No active cardiopulmonary disease. Electronically Signed   By: Anner Crete M.D.   On: 09/14/2018 02:20    Pending Labs Unresulted Labs (From admission, onward)    Start     Ordered   09/14/18 0134  Urine culture  STAT,   STAT     09/14/18 0133   09/13/18 2031  Culture, blood (Routine x 2)  BLOOD CULTURE X 2,   STAT     09/13/18 2031   Signed and Held  Procalcitonin - Baseline  STAT,   STAT     Signed and Held   Signed and Held  Procalcitonin  Daily,   R     Signed and Held   Signed and Held  Basic metabolic panel  Tomorrow morning,   R     Signed and Held          Vitals/Pain Today's Vitals   09/14/18 0030 09/14/18 0100 09/14/18 0200 09/14/18 0230  BP: (!) 167/89 (!) 165/88 137/90 (!) 158/97  Pulse: 93 98 99 93  Resp:      Temp:      TempSrc:  SpO2: 98% 100% 99% 98%  Weight:      Height:      PainSc:        Isolation Precautions No active isolations  Medications Medications  cefTRIAXone (ROCEPHIN) 1 g in sodium chloride 0.9 % 100 mL IVPB (has no administration in time range)  lactated ringers infusion (has no administration in time range)  HYDROmorphone (DILAUDID) injection 0.5-1 mg (has no administration in time range)  ondansetron (ZOFRAN) injection 4 mg (has no administration in time range)  iopamidol (ISOVUE-300) 61 % injection 30 mL (has no administration in time range)  0.9 %  sodium chloride infusion ( Intravenous Stopped 09/13/18 2220)  lidocaine (PF) (XYLOCAINE) 1 % injection 5 mL (5 mLs Other Given by Other 09/13/18 2205)  potassium chloride SA (K-DUR,KLOR-CON) CR tablet 40 mEq (40 mEq Oral Given 09/14/18 0009)  vancomycin (VANCOCIN) IVPB 1000 mg/200 mL premix (0 mg Intravenous Stopped 09/14/18 0130)  cefTRIAXone (ROCEPHIN) 1 g in sodium chloride 0.9 % 100 mL IVPB (0 g Intravenous Stopped 09/14/18 0005)    Mobility walks with device

## 2018-09-14 NOTE — ED Notes (Signed)
2nd IV attempt unsuccessful however was able to collect 2nd set of blood cultures before starting antibiotics.

## 2018-09-14 NOTE — ED Notes (Signed)
Family at bedside. 

## 2018-09-15 LAB — BASIC METABOLIC PANEL
Anion gap: 6 (ref 5–15)
BUN: 10 mg/dL (ref 6–20)
CO2: 27 mmol/L (ref 22–32)
Calcium: 8.5 mg/dL — ABNORMAL LOW (ref 8.9–10.3)
Chloride: 107 mmol/L (ref 98–111)
Creatinine, Ser: 1.14 mg/dL (ref 0.61–1.24)
GFR calc Af Amer: >60 mL/min (ref >60)
GFR calc non Af Amer: >60 mL/min (ref >60)
Glucose, Bld: 126 mg/dL — ABNORMAL HIGH (ref 70–99)
Potassium: 3.7 mmol/L (ref 3.5–5.1)
Sodium: 140 mmol/L (ref 135–145)

## 2018-09-15 LAB — URINE CULTURE

## 2018-09-15 LAB — CBC
HCT: 31.2 % — ABNORMAL LOW (ref 39.0–52.0)
Hemoglobin: 9.6 g/dL — ABNORMAL LOW (ref 13.0–17.0)
MCH: 25.9 pg — ABNORMAL LOW (ref 26.0–34.0)
MCHC: 30.8 g/dL (ref 30.0–36.0)
MCV: 84.1 fL (ref 80.0–100.0)
Platelets: 222 10*3/uL (ref 150–400)
RBC: 3.71 MIL/uL — ABNORMAL LOW (ref 4.22–5.81)
RDW: 19.9 % — ABNORMAL HIGH (ref 11.5–15.5)
WBC: 9 10*3/uL (ref 4.0–10.5)
nRBC: 0 % (ref 0.0–0.2)

## 2018-09-15 LAB — PROCALCITONIN: Procalcitonin: <0.1 ng/mL

## 2018-09-15 LAB — URIC ACID: Uric Acid, Serum: 5.8 mg/dL (ref 3.7–8.6)

## 2018-09-15 MED ORDER — KETOROLAC TROMETHAMINE 30 MG/ML IJ SOLN
30.0000 mg | Freq: Once | INTRAMUSCULAR | Status: AC
  Administered 2018-09-15: 30 mg via INTRAVENOUS
  Filled 2018-09-15: qty 1

## 2018-09-15 MED ORDER — PREDNISONE 20 MG PO TABS: 40.0000 mg | ORAL_TABLET | Freq: Every day | ORAL | 0 refills | Status: DC

## 2018-09-15 MED ORDER — CEPHALEXIN 500 MG PO CAPS: 500.0000 mg | ORAL_CAPSULE | Freq: Two times a day (BID) | ORAL | 0 refills | Status: AC

## 2018-09-15 MED ORDER — OXYCODONE HCL 5 MG PO TABS: 5.0000 mg | ORAL_TABLET | Freq: Four times a day (QID) | ORAL | 0 refills | Status: DC | PRN

## 2018-09-15 NOTE — Care Management Note (Signed)
Case Management Note  Patient Details  Name: SHIRAZ BASTYR MRN: [id number] Date of Birth: Aug 13, 1961  Subjective/Objective:      Patient home home; independent; lives with wife.  Admitted for sepsis.  Discharging today on po antibiotics.  Open to [facility] and [place].  Faxed prescriptions to [facility].  Will ask volunteer services to pick up medications.  Patient is taking a taxi home.  He normally drives, but does not have car here and no one can pick him up.  He is paying for the cab.  Independent in all adls, denies issues accessing medical care, obtaining medications or with transportation.  No discharge needs identified at present by care manager or members of care team.                Action/Plan:   Expected Discharge Date:  09/15/18               Expected Discharge Plan:  Home/Self Care  In-House Referral:     Discharge planning Services  CM Consult, Medication Assistance  Post Acute Care Choice:    Choice offered to:     DME Arranged:    DME Agency:     HH Arranged:    HH Agency:     Status of Service:  Completed, signed off  If discussed at [employer], dates discussed:    Additional Comments:  Elza Rafter, RN 09/15/2018, 2:17 PM

## 2018-09-15 NOTE — Progress Notes (Signed)
Went over discharge instructions with the patient about new medication and follow- up appointment. Prednisone and Keflex are given to the patient through medication management. Discontinue peripheral IV and telemetry monitor. Will call volunteer to help patient for transport. Patient is to call taxi to go home.

## 2018-09-15 NOTE — Progress Notes (Signed)
Per Dr. Brett Albino, patient to be discharged after cultures have resulted this afternoon.

## 2018-09-15 NOTE — Discharge Summary (Signed)
[facility] [place] NAME: Miguel [name]    MR#:  [id number]  DATE OF BIRTH:  [birth year]  DATE OF ADMISSION:  09/13/2018   ADMITTING PHYSICIAN: Arta Silence, MD  DATE OF DISCHARGE: No discharge date for patient encounter.  PRIMARY CARE PHYSICIAN: System, Pcp Not In   ADMISSION DIAGNOSIS:  Left Leg Pain  EMS DISCHARGE DIAGNOSIS:  Active Problems:   Sepsis secondary to UTI ([place])  SECONDARY DIAGNOSIS:   Past Medical History:  Diagnosis Date  . Alcohol abuse   . Asthma   . GERD (gastroesophageal reflux disease)   . Gout   . Hypertension   . OCD (obsessive compulsive disorder)   . Renal colic    HOSPITAL COURSE:   Miguel [name] is a 57 year old male with a history of gout and hypertension who presented to the ED with left knee pain and swelling.  In the ED, he was meeting sepsis criteria with fever, tachycardia, leukocytosis, lactic acidosis.  UA was consistent with infection.  He was admitted for further management.  Sepsis likely secondary to UTI-sepsis resolved -Initially treated with ceftriaxone, transitioned to Keflex on discharge for a total 7-day course -Urine culture with multiple species -CT abdomen with multiple nonobstructing bilateral renal calculi- consider outpatient urology referral  Left knee pain/swelling-patient stated that this feels like a typical gout flare for him -Underwent arthrocentesis in the ED -Body fluid Gram stain without any organisms, body fluid culture with no growth -Given prednisone for presumed gout flare, for a total 5-day course -Pain greatly improved after starting prednisone -Allopurinol continued -Patient advised to follow-up with orthopedic surgery as an outpatient   DISCHARGE CONDITIONS:  UTI Left knee pain-likely gout flare Hypertension OCD Asthma CONSULTS OBTAINED:  Treatment Team:  Arta Silence, MD DRUG ALLERGIES:  No Known Allergies DISCHARGE MEDICATIONS:   Allergies  as of 09/15/2018   No Known Allergies     Medication List    TAKE these medications   albuterol 108 (90 Base) MCG/ACT inhaler Commonly known as:  PROVENTIL HFA;VENTOLIN HFA Inhale 2 puffs into the lungs every 4 (four) hours as needed.   allopurinol 300 MG tablet Commonly known as:  ZYLOPRIM Take 1 tablet (300 mg total) by mouth daily.   cephALEXin 500 MG capsule Commonly known as:  KEFLEX Take 1 capsule (500 mg total) by mouth 2 (two) times daily for 5 days.   citalopram 10 MG tablet Commonly known as:  CELEXA Take 1 tablet (10 mg total) by mouth at bedtime.   cloNIDine 0.1 MG tablet Commonly known as:  CATAPRES Take 1 tablet (0.1 mg total) by mouth 2 (two) times daily.   diphenhydrAMINE 50 MG tablet Commonly known as:  BENADRYL Take 1 tablet (50 mg total) by mouth at bedtime as needed for itching.   ferrous sulfate 325 (65 FE) MG tablet Take 1 tablet (325 mg total) by mouth 2 (two) times daily with a meal.   Fluticasone-Salmeterol 100-50 MCG/DOSE Aepb Commonly known as:  ADVAIR Inhale 1 puff into the lungs 2 (two) times daily.   folic acid 1 MG tablet Commonly known as:  FOLVITE Take 1 tablet (1 mg total) by mouth daily.   lisinopril 20 MG tablet Commonly known as:  PRINIVIL,ZESTRIL Take 0.5 tablets (10 mg total) by mouth daily.   magnesium oxide 400 (241.3 Mg) MG tablet Commonly known as:  MAG-OX Take 1 tablet (400 mg total) by mouth daily.   multivitamin with minerals tablet Take 1 tablet by  mouth daily.   oxyCODONE 5 MG immediate release tablet Commonly known as:  Oxy IR/ROXICODONE Take 1 tablet (5 mg total) by mouth every 6 (six) hours as needed for severe pain.   pantoprazole 40 MG tablet Commonly known as:  PROTONIX Take 1 tablet (40 mg total) by mouth daily.   predniSONE 20 MG tablet Commonly known as:  DELTASONE Take 2 tablets (40 mg total) by mouth daily with breakfast. Start taking on:  09/16/2018   thiamine 100 MG tablet Commonly known as:   VITAMIN B-1 Take 1 tablet (100 mg total) by mouth daily.   tiZANidine 4 MG tablet Commonly known as:  ZANAFLEX Take 1 tablet (4 mg total) by mouth every 6 (six) hours as needed for muscle spasms.        DISCHARGE INSTRUCTIONS:  1.  Follow-up with PCP in 1 to 2 weeks 2.  Follow-up with orthopedic surgery in 1 to 2 weeks DIET:  Cardiac diet DISCHARGE CONDITION:  Stable ACTIVITY:  Activity as tolerated OXYGEN:  Home Oxygen: No.  Oxygen Delivery: room air DISCHARGE LOCATION:  home   If you experience worsening of your admission symptoms, develop shortness of breath, life threatening emergency, suicidal or homicidal thoughts you must seek medical attention immediately by calling 911 or calling your MD immediately  if symptoms less severe.  You Must read complete instructions/literature along with all the possible adverse reactions/side effects for all the Medicines you take and that have been prescribed to you. Take any new Medicines after you have completely understood and accpet all the possible adverse reactions/side effects.   Please note  You were cared for by a hospitalist during your hospital stay. If you have any questions about your discharge medications or the care you received while you were in the hospital after you are discharged, you can call the unit and asked to speak with the hospitalist on call if the hospitalist that took care of you is not available. Once you are discharged, your primary care physician will handle any further medical issues. Please note that NO REFILLS for any discharge medications will be authorized once you are discharged, as it is imperative that you return to your primary care physician (or establish a relationship with a primary care physician if you do not have one) for your aftercare needs so that they can reassess your need for medications and monitor your lab values.    On the day of Discharge:  VITAL SIGNS:  Blood pressure 133/87, pulse  78, temperature (!) 97.3 F (36.3 C), temperature source Oral, resp. rate 17, height 6' (1.829 m), weight 85.6 kg, SpO2 99 %. PHYSICAL EXAMINATION:  GENERAL:  57 y.o.-year-old patient lying in the bed with no acute distress.  EYES: Pupils equal, round, reactive to light and accommodation. No scleral icterus. Extraocular muscles intact.  HEENT: Head atraumatic, normocephalic. Oropharynx and nasopharynx clear.  NECK:  Supple, no jugular venous distention. No thyroid enlargement, no tenderness.  LUNGS: Normal breath sounds bilaterally, no wheezing, rales,rhonchi or crepitation. No use of accessory muscles of respiration.  CARDIOVASCULAR: S1, S2 normal. No murmurs, rubs, or gallops.  ABDOMEN: Soft, non-tender, non-distended. Bowel sounds present. No organomegaly or mass.  EXTREMITIES: No pedal edema, cyanosis, or clubbing. + Left knee with mild swelling, improved from yesterday.  No erythema or warmth of left knee. NEUROLOGIC: Cranial nerves II through XII are intact. Muscle strength 5/5 in all extremities. Sensation intact. Gait not checked.  PSYCHIATRIC: The patient is alert and oriented x 3.  SKIN: No obvious rash, lesion, or ulcer.  DATA REVIEW:   CBC Recent Labs  Lab 09/15/18 0503  WBC 9.0  HGB 9.6*  HCT 31.2*  PLT 222    Chemistries  Recent Labs  Lab 09/13/18 2050  09/15/18 0503  NA 136   < > 140  K 2.8*   < > 3.7  CL 101   < > 107  CO2 21*   < > 27  GLUCOSE 139*   < > 126*  BUN 10   < > 10  CREATININE 1.21   < > 1.14  CALCIUM 9.2   < > 8.5*  MG 1.4*  --   --   AST 17  --   --   ALT 9  --   --   ALKPHOS 70  --   --   BILITOT 2.2*  --   --    < > = values in this interval not displayed.     Microbiology Results  Results for orders placed or performed during the hospital encounter of 09/13/18  Culture, blood (Routine x 2)     Status: None (Preliminary result)   Collection Time: 09/13/18  8:50 PM  Result Value Ref Range Status   Specimen Description BLOOD RIGHT  ANTECUBITAL  Final   Special Requests   Final    BOTTLES DRAWN AEROBIC AND ANAEROBIC Blood Culture results may not be optimal due to an excessive volume of blood received in culture bottles   Culture   Final    NO GROWTH 2 DAYS Performed at [facility], [address], [place], [place] [address]    Report Status PENDING  Incomplete  Culture, blood (Routine x 2)     Status: None (Preliminary result)   Collection Time: 09/13/18  8:50 PM  Result Value Ref Range Status   Specimen Description BLOOD BLOOD RIGHT FOREARM  Final   Special Requests   Final    BOTTLES DRAWN AEROBIC AND ANAEROBIC Blood Culture adequate volume   Culture   Final    NO GROWTH 2 DAYS Performed at [facility], [address] [place], [name] [address]    Report Status PENDING  Incomplete  Urine culture     Status: Abnormal   Collection Time: 09/13/18  8:50 PM  Result Value Ref Range Status   Specimen Description   Final    URINE, CLEAN CATCH Performed at [facility], [address], [place], [place] [address]    Special Requests   Final    NONE Performed at [facility], [address] [place], [place] [address]    Culture MULTIPLE SPECIES PRESENT, SUGGEST RECOLLECTION (A)  Final   Report Status 09/15/2018 FINAL  Final  Gram stain     Status: None   Collection Time: 09/13/18 10:08 PM  Result Value Ref Range Status   Specimen Description KNEE  Final   Special Requests NONE  Final   Gram Stain   Final    NO ORGANISMS SEEN MANY WBC SEEN Performed at [facility], [address] [place], [place] [address]    Report Status 09/13/2018 FINAL  Final  Body fluid culture     Status: None (Preliminary result)   Collection Time: 09/13/18 10:08 PM  Result Value Ref Range Status   Specimen Description   Final    KNEE Performed at [facility], [address], [place], [place] [address]    Special Requests   Final  NONE Performed at [facility], [address] [place], [place] [address]    Gram Stain   Final    ABUNDANT WBC PRESENT, PREDOMINANTLY PMN NO ORGANISMS SEEN Performed at [facility] [address] [place], [place] [address]    Culture PENDING  Incomplete   Report Status PENDING  Incomplete    RADIOLOGY:  No results found.   Management plans discussed with the patient, family and they are in agreement.  CODE STATUS: Full Code   TOTAL TIME TAKING CARE OF THIS PATIENT: 33 minutes.    Berna Spare Desmond Tufano M.D on 09/15/2018 at 11:41 AM  Between 7am to 6pm - Pager - [phone]  After 6pm go to [email or web address] - Proofreader  [facility]  Hospitalists  Office  [phone]  CC: Primary care physician; System, Pcp Not In   Note: This dictation was prepared with Dragon dictation along with smaller phrase technology. Any transcriptional errors that result from this process are unintentional.

## 2018-09-15 NOTE — Discharge Instructions (Signed)
It was so nice to meet you during this hospitalization!  For your UTI- please take Keflex 500mg  twice a day for 5 more days For the knee swelling- please take Prednisone 2 tablets (40mg ) once a day for 4 more days  Please take tylenol (acetaminophen) and advil (ibuprofen) as needed for pain. I have given you some oxycodone to use for breakthrough pain.  -Dr. Brett Albino

## 2018-09-15 NOTE — Progress Notes (Signed)
Refused bed alarm. Educated on safety.

## 2018-09-17 LAB — BODY FLUID CULTURE: Culture: NO GROWTH

## 2018-09-18 LAB — CULTURE, BLOOD (ROUTINE X 2)
Culture: NO GROWTH
Culture: NO GROWTH
Special Requests: ADEQUATE

## 2018-09-20 ENCOUNTER — Other Ambulatory Visit: Payer: Self-pay

## 2018-09-20 DIAGNOSIS — N63 Unspecified lump in unspecified breast: Secondary | ICD-10-CM

## 2018-09-22 ENCOUNTER — Other Ambulatory Visit: Payer: Self-pay

## 2018-09-22 DIAGNOSIS — N63 Unspecified lump in unspecified breast: Secondary | ICD-10-CM

## 2018-09-22 NOTE — Progress Notes (Signed)
Updated CPT codes
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
## 2018-09-29 ENCOUNTER — Ambulatory Visit: Payer: Self-pay | Admitting: Gerontology

## 2018-09-30 ENCOUNTER — Other Ambulatory Visit: Payer: Self-pay

## 2018-09-30 ENCOUNTER — Emergency Department: Payer: Medicaid Other

## 2018-09-30 ENCOUNTER — Emergency Department
Admission: EM | Admit: 2018-09-30 | Discharge: 2018-09-30 | Disposition: A | Discharge status: Home / Self Care | Payer: Medicaid Other | Attending: Emergency Medicine | Admitting: Emergency Medicine

## 2018-09-30 DIAGNOSIS — J45909 Unspecified asthma, uncomplicated: Secondary | ICD-10-CM | POA: Insufficient documentation

## 2018-09-30 DIAGNOSIS — R319 Hematuria, unspecified: Secondary | ICD-10-CM | POA: Insufficient documentation

## 2018-09-30 DIAGNOSIS — I10 Essential (primary) hypertension: Secondary | ICD-10-CM | POA: Insufficient documentation

## 2018-09-30 DIAGNOSIS — Z79899 Other long term (current) drug therapy: Secondary | ICD-10-CM | POA: Insufficient documentation

## 2018-09-30 DIAGNOSIS — Z87891 Personal history of nicotine dependence: Secondary | ICD-10-CM | POA: Insufficient documentation

## 2018-09-30 DIAGNOSIS — N390 Urinary tract infection, site not specified: Secondary | ICD-10-CM | POA: Insufficient documentation

## 2018-09-30 LAB — BASIC METABOLIC PANEL
Anion gap: 15 (ref 5–15)
BUN: 13 mg/dL (ref 6–20)
CO2: 20 mmol/L — ABNORMAL LOW (ref 22–32)
Calcium: 9.4 mg/dL (ref 8.9–10.3)
Chloride: 102 mmol/L (ref 98–111)
Creatinine, Ser: 1.32 mg/dL — ABNORMAL HIGH (ref 0.61–1.24)
GFR calc Af Amer: >60 mL/min (ref >60)
GFR calc non Af Amer: 58 mL/min — ABNORMAL LOW (ref >60)
Glucose, Bld: 117 mg/dL — ABNORMAL HIGH (ref 70–99)
Potassium: 3.9 mmol/L (ref 3.5–5.1)
Sodium: 137 mmol/L (ref 135–145)

## 2018-09-30 LAB — URINALYSIS, COMPLETE (UACMP) WITH MICROSCOPIC
Bilirubin Urine: NEGATIVE
Glucose, UA: NEGATIVE mg/dL
Ketones, ur: 80 mg/dL — AB
Nitrite: NEGATIVE
Protein, ur: 100 mg/dL — AB
RBC / HPF: >50 RBC/hpf — ABNORMAL HIGH (ref 0–5)
Specific Gravity, Urine: 1.026 (ref 1.005–1.030)
pH: 5 (ref 5.0–8.0)

## 2018-09-30 LAB — CBC
HCT: 39.1 % (ref 39.0–52.0)
Hemoglobin: 12.4 g/dL — ABNORMAL LOW (ref 13.0–17.0)
MCH: 25.6 pg — ABNORMAL LOW (ref 26.0–34.0)
MCHC: 31.7 g/dL (ref 30.0–36.0)
MCV: 80.6 fL (ref 80.0–100.0)
Platelets: 291 10*3/uL (ref 150–400)
RBC: 4.85 MIL/uL (ref 4.22–5.81)
RDW: 18.5 % — ABNORMAL HIGH (ref 11.5–15.5)
WBC: 7.4 10*3/uL (ref 4.0–10.5)
nRBC: 0 % (ref 0.0–0.2)

## 2018-09-30 LAB — LACTIC ACID, PLASMA: Lactic Acid, Venous: 0.8 mmol/L (ref 0.5–1.9)

## 2018-09-30 LAB — TROPONIN I: Troponin I: <0.03 ng/mL (ref <0.03)

## 2018-09-30 MED ORDER — ONDANSETRON HCL 4 MG/2ML IJ SOLN
4.0000 mg | Freq: Once | INTRAMUSCULAR | Status: AC
  Administered 2018-09-30: 4 mg via INTRAVENOUS
  Filled 2018-09-30: qty 2

## 2018-09-30 MED ORDER — LORAZEPAM 2 MG/ML IJ SOLN
1.0000 mg | Freq: Once | INTRAMUSCULAR | Status: AC
  Administered 2018-09-30: 1 mg via INTRAVENOUS
  Filled 2018-09-30: qty 1

## 2018-09-30 MED ORDER — CEPHALEXIN 500 MG PO CAPS: 500.0000 mg | ORAL_CAPSULE | Freq: Two times a day (BID) | ORAL | 0 refills | Status: DC

## 2018-09-30 MED ORDER — SODIUM CHLORIDE 0.9 % IV BOLUS
1000.0000 mL | Freq: Once | INTRAVENOUS | Status: AC
  Administered 2018-09-30: 1000 mL via INTRAVENOUS

## 2018-09-30 MED ORDER — SODIUM CHLORIDE 0.9 % IV SOLN
1.0000 g | Freq: Once | INTRAVENOUS | Status: AC
  Administered 2018-09-30: 1 g via INTRAVENOUS
  Filled 2018-09-30: qty 10

## 2018-09-30 NOTE — Care Management Note (Signed)
Case Management Note  Patient Details  Name: NADEEM ROMANOSKI MRN: [id number] Date of Birth: [birth year]  Subjective/Objective:   Patient is from home, lives with his wife.  His wife is disabled with MS and he is her caregiver.  They have an old home 1800 square feet, he reports that the ceilings are tall and the home remains cold.  The patient does state that they have a gas fireplace and a portable heater.  The patient comes to the ED with nausea vomiting and sweating.  The patient reports that he is looking for resources he reports they have a limited income.  The wife has Medicare and Medicaid because of her disability but the patient has no insurance.  He is set up with [facility] and MM, he has an appointment at [facility] on Tuesday next week.  The patient drives and has reliable transportation.  The patient reports that he is completely independent in ADL's but he does not work, he has to stay home and take care of his wife.  She does have aide services that come in Monday, Wed, and Friday for 4 hours during the day but the rest of the time he provides care for her.  RNCM gave patient [employer] and a printout of [place] list.  These lists have assistance for food, shelter, utility and rent assistance, and aging and disabled services.   Patient appreciative of resources and will follow up with appropriate community services.   Doran Clay RN, BSN [phone]                 Action/Plan:   Expected Discharge Date:                  Expected Discharge Plan:  Home/Self Care  In-House Referral:  Clinical Social Work  Discharge planning Services  CM Consult  Post Acute Care Choice:    Choice offered to:     DME Arranged:    DME Agency:     HH Arranged:    [place] Agency:     Status of Service:  Completed, signed off  If discussed at [employer], dates discussed:    Additional Comments:  Shelbie Hutching, RN 09/30/2018, 12:22 PM

## 2018-09-30 NOTE — ED Notes (Signed)
Case management at bedside.

## 2018-09-30 NOTE — ED Triage Notes (Signed)
States central and L abd and central CP x few days. States sweating and vomiting. Unsure of fever. Denies diarrhea.   A&O, ambulatory.

## 2018-09-30 NOTE — ED Notes (Addendum)
Pt states that he has been nauseated, sweating, and vomited once yesterday, pt states that he keeps waking up in sweats. Pt denies cough and congestion, some sob, so he used his inhalers. Pt reports that he was admitted a week ago for gout, pt also states that the back of his neck is hurting badly. Pt states that he was treated with prednisone for the gout in his knee, pt states that he finished the prednisone on either Monday or Tuesday and started the sweating episodes on wednesday

## 2018-09-30 NOTE — ED Provider Notes (Signed)
[facility] Emergency Department Provider Note ____________________________________________   I have reviewed the triage vital signs and the triage nursing note.  HISTORY  Chief Complaint Abdominal Pain and Chest Pain   Historian Patient  HPI Miguel [name] is a 57 y.o. male with a history of hypertension, chart review positive for GERD and asthma and alcohol abuse, lives at home with his wife who has MS and is needing significant care and he is the primary care provider, presents to the ER for feeling bad for about 2 to 3 days.  He states that he has been shaking at home without known fevers.  He states that he has had decreased p.o. intake and decreased appetite and nausea.  He had decreased bowel movement and no diarrhea, but states that since he had nothing in there but nothing out.  He continues to be nauseated.  He states that he kind of aches all over without any focal chest pain or abdominal pain per se.  Denies urinary symptoms.  States that he was in the hospital fairly recently for urinary tract infection.  When I reviewed the chart review he was admitted for UTI and sepsis at that point time.  Patient seems quite extremely stressed out with the pressures of being caregiver to his sick wife.  With the recent extreme low temperatures, he states that his house does not have adequate heating and his small space heater gave out and so they have been living where it is a 40 degrees in the house and he is stressed about finances of making rent as well as getting the heat faxed.  States that he does not have many friends or social connections that can help.  His wife does have an elderly aunt that comes around to help at some points.     Past Medical History:  Diagnosis Date  . Alcohol abuse   . Asthma   . GERD (gastroesophageal reflux disease)   . Gout   . Hypertension   . OCD (obsessive compulsive disorder)   . Renal colic     Patient Active  Problem List   Diagnosis Date Noted  . Sepsis secondary to UTI ([place]) 09/14/2018  . Asthma 07/07/2018  . GERD (gastroesophageal reflux disease) 07/07/2018  . OCD (obsessive compulsive disorder) 07/07/2018  . Self-inflicted laceration of wrist 03/10/2018  . Leg hematoma 12/25/2017  . Suicide and self-inflicted injury by cutting and piercing instrument ([place]) 03/10/2017  . Severe recurrent major 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 ([place]) 03/09/2017  . Substance induced mood disorder ([place]) 08/15/2016  . Involuntary commitment 08/15/2016  . Alcohol use disorder, severe, dependence ([place]) 02/05/2016  . Hypertension 12/05/2015  . Tachycardia 12/05/2015  . Gout 11/13/2015  . Chronic back pain 05/02/2015    Past Surgical History:  Procedure Laterality Date  . CHOLECYSTECTOMY  2012    Prior to Admission medications   Medication Sig Start Date End Date Taking? Authorizing Provider  albuterol (VENTOLIN HFA) 108 (90 Base) MCG/ACT inhaler Inhale 2 puffs into the lungs every 4 (four) hours as needed. 09/01/18  Yes Tukov-Yual, Arlyss Gandy, NP  allopurinol (ZYLOPRIM) 300 MG tablet Take 1 tablet (300 mg total) by mouth daily. 09/01/18  Yes Tukov-Yual, Arlyss Gandy, NP  citalopram (CELEXA) 10 MG tablet Take 1 tablet (10 mg total) by mouth at bedtime. 09/01/18  Yes Tukov-Yual, Arlyss Gandy, NP  cloNIDine (CATAPRES) 0.1 MG tablet Take 1 tablet (0.1 mg total) by mouth 2 (two) times daily. 09/01/18  Yes Tukov-Yual, Magdalene  S, NP  Fluticasone-Salmeterol (ADVAIR DISKUS) 100-50 MCG/DOSE AEPB Inhale 1 puff into the lungs 2 (two) times daily. 09/01/18  Yes Tukov-Yual, Arlyss Gandy, NP  folic acid (FOLVITE) 1 MG tablet Take 1 tablet (1 mg total) by mouth daily. 09/01/18  Yes Tukov-Yual, Magdalene S, NP  lisinopril (PRINIVIL,ZESTRIL) 20 MG tablet Take 0.5 tablets (10 mg total) by mouth daily. 09/08/18  Yes Iloabachie, Chioma E, NP  Multiple Vitamins-Minerals (MULTIVITAMIN WITH MINERALS) tablet Take 1 tablet by  mouth daily. 09/01/18  Yes Tukov-Yual, Magdalene S, NP  pantoprazole (PROTONIX) 40 MG tablet Take 1 tablet (40 mg total) by mouth daily. 09/01/18  Yes Tukov-Yual, Magdalene S, NP  tiZANidine (ZANAFLEX) 4 MG tablet Take 1 tablet (4 mg total) by mouth every 6 (six) hours as needed for muscle spasms. 09/01/18  Yes Tukov-Yual, Magdalene S, NP  cephALEXin (KEFLEX) 500 MG capsule Take 1 capsule (500 mg total) by mouth 2 (two) times daily. 09/30/18   Lisa Roca, MD  diphenhydrAMINE (BENADRYL) 50 MG tablet Take 1 tablet (50 mg total) by mouth at bedtime as needed for itching. Patient not taking: Reported on 09/30/2018 09/01/18   Erlene Quan, NP  ferrous sulfate 325 (65 FE) MG tablet Take 1 tablet (325 mg total) by mouth 2 (two) times daily with a meal. Patient not taking: Reported on 09/30/2018 09/01/18   Tukov-Yual, Arlyss Gandy, NP  magnesium oxide (MAG-OX) 400 (241.3 Mg) MG tablet Take 1 tablet (400 mg total) by mouth daily. Patient not taking: Reported on 09/30/2018 09/01/18   Tukov-Yual, Arlyss Gandy, NP  oxyCODONE (ROXICODONE) 5 MG immediate release tablet Take 1 tablet (5 mg total) by mouth every 6 (six) hours as needed for severe pain. Patient not taking: Reported on 09/30/2018 09/15/18   Mayo, Pete Pelt, MD  predniSONE (DELTASONE) 20 MG tablet Take 2 tablets (40 mg total) by mouth daily with breakfast. Patient not taking: Reported on 09/30/2018 09/16/18   Mayo, Pete Pelt, MD  thiamine (VITAMIN B-1) 100 MG tablet Take 1 tablet (100 mg total) by mouth daily. Patient not taking: Reported on 09/30/2018 09/01/18   Erlene Quan, NP    No Known Allergies  Family History  Problem Relation Age of Onset  . Alcohol abuse Father     Social History Social History   Tobacco Use  . Smoking status: Former Research scientist (life sciences)  . Smokeless tobacco: Never Used  . Tobacco comment: quit 30 years ago  Substance Use Topics  . Alcohol use: Not Currently    Alcohol/week: 7.0 standard drinks     Types: 7 Glasses of wine per week    Comment: per pt "i do sometimes about a glass of wine with the football game"  . Drug use: No    Review of Systems  Constitutional: Negative for fever.  Positive for chills. Eyes: Negative for visual changes. ENT: Negative for sore throat. Cardiovascular: Negative for chest pain. Respiratory: Negative for shortness of breath. Gastrointestinal: Positive for nausea but no real vomiting or diarrhea. Genitourinary: Negative for dysuria. Musculoskeletal: Negative for back pain. Skin: Negative for rash. Neurological: Negative for headache.  ____________________________________________   PHYSICAL EXAM:  VITAL SIGNS: ED Triage Vitals  Enc Vitals Group     BP 09/30/18 0823 (!) 157/93     Pulse Rate 09/30/18 0823 (!) 103     Resp 09/30/18 0823 20     Temp 09/30/18 0823 98.1 F (36.7 C)     Temp Source 09/30/18 0823 Oral     SpO2 09/30/18  0823 100 %     Weight 09/30/18 0823 185 lb (83.9 kg)     Height 09/30/18 0823 6' (1.829 m)     Head Circumference --      Peak Flow --      Pain Score 09/30/18 0829 7     Pain Loc --      Pain Edu? --      Excl. in [place]? --      Constitutional: Alert and oriented.  Anxious. HEENT      Head: Normocephalic and atraumatic.      Eyes: Conjunctivae are normal. Pupils equal and round.       Ears:         Nose: No congestion/rhinnorhea.      Mouth/Throat: Mucous membranes are currently dry.      Neck: No stridor. Cardiovascular/Chest: Tachycardic rate, regular rhythm.  No murmurs, rubs, or gallops. Respiratory: Normal respiratory effort without tachypnea nor retractions. Breath sounds are clear and equal bilaterally. No wheezes/rales/rhonchi. Gastrointestinal: Soft. No distention, no guarding, no rebound. Nontender.    Genitourinary/rectal:Deferred Musculoskeletal: Nontender with normal range of motion in all extremities. No joint effusions.  No lower extremity tenderness.  No edema. Neurologic:  Normal  speech and language. No gross or focal neurologic deficits are appreciated. Skin:  Skin is warm, dry and intact. No rash noted. Psychiatric: He is anxious and tearful at times but reports no depression or suicidal ideation.   ____________________________________________  LABS (pertinent positives/negatives) I, Lisa Roca, MD the attending physician have reviewed the labs noted below.  Labs Reviewed  BASIC METABOLIC PANEL - Abnormal; Notable for the following components:      Result Value   CO2 20 (*)    Glucose, Bld 117 (*)    Creatinine, Ser 1.32 (*)    GFR calc non Af Amer 58 (*)    All other components within normal limits  CBC - Abnormal; Notable for the following components:   Hemoglobin 12.4 (*)    MCH 25.6 (*)    RDW 18.5 (*)    All other components within normal limits  URINALYSIS, COMPLETE (UACMP) WITH MICROSCOPIC - Abnormal; Notable for the following components:   Color, Urine AMBER (*)    APPearance HAZY (*)    Hgb urine dipstick LARGE (*)    Ketones, ur 80 (*)    Protein, ur 100 (*)    Leukocytes, UA SMALL (*)    RBC / HPF >50 (*)    Bacteria, UA RARE (*)    All other components within normal limits  URINE CULTURE  CULTURE, BLOOD (ROUTINE X 2)  CULTURE, BLOOD (ROUTINE X 2)  TROPONIN I  LACTIC ACID, PLASMA  LACTIC ACID, PLASMA    ____________________________________________    EKG I, Lisa Roca, MD, the attending physician have personally viewed and interpreted all ECGs.  94 bpm.  Normal sinus rhythm.  Nonspecific intraventricular conduction delay.  Q waves inferiorly.  Nonspecific T wave. ____________________________________________  RADIOLOGY   Chest 2 view: No edema or consolidation. __________________________________________  PROCEDURES  Procedure(s) performed: None  Procedures  Critical Care performed: None   ____________________________________________  ED COURSE / ASSESSMENT AND PLAN  Pertinent labs & imaging results that were  available during my care of the patient were reviewed by me and considered in my medical decision making (see chart for details).    Patient presents highly anxious and seems like he is under a lot of social stressors right now.  From a medical perspective it  sounds like he may be having chills at home although he thinks it may just be because it is cold in his house.  Slight tachycardia here around 103.  Patient does seem a little bit dry clinically and was given IV fluid bolus.  Urine creatinine are just slightly elevated.  No elevated white blood cell count.  Urinalysis consistent with urinary tract infection.   Reevaluation at around 1245.  Patient is feeling much better.  Social worker did speak with him and give him some resources.  We discussed treating with 1 dose of IV Rocephin here and then discharged home.  He does not show any signs of sepsis and I think it is reasonable to treat at home and patient definitely wants to go home and not stay overnight in the hospital.      CONSULTATIONS:   Social work.   Patient / Family / Caregiver informed of clinical course, medical decision-making process, and agree with plan.   I discussed return precautions, follow-up instructions, and discharge instructions with patient and/or family.  Discharge Instructions: You are being treated for urinary tract infection.  You were given 24-hour dose of IV antibiotic Rocephin in the emergency department.  Start your antibiotic Keflex tomorrow.  Return to the emerge department immediately for any worsening condition including fever, nausea and vomiting and unable to keep medication down, abdominal pain, confusion altered mental status, dizziness or passing out, or any other symptoms concerning to you.    ___________________________________________   FINAL CLINICAL IMPRESSION(S) / ED DIAGNOSES   Final diagnoses:  Urinary tract infection with hematuria, site unspecified       ___________________________________________         Note: This dictation was prepared with Dragon dictation. Any transcriptional errors that result from this process are unintentional    Lisa Roca, MD 09/30/18 1252

## 2018-09-30 NOTE — ED Notes (Addendum)
FIRST NURSE NOTE:  Pt c/o abdominal pain, states he was seen here recently for similar sxs.    Pt has hx of alcohol use.

## 2018-09-30 NOTE — Discharge Instructions (Signed)
You are being treated for urinary tract infection.  You were given 24-hour dose of IV antibiotic Rocephin in the emergency department.  Start your antibiotic Keflex tomorrow.  Return to the emerge department immediately for any worsening condition including fever, nausea and vomiting and unable to keep medication down, abdominal pain, confusion altered mental status, dizziness or passing out, or any other symptoms concerning to you.

## 2018-09-30 NOTE — ED Notes (Signed)
Lab to add on UC to urine in lab

## 2018-10-01 LAB — URINE CULTURE: Culture: NO GROWTH

## 2018-10-03 ENCOUNTER — Ambulatory Visit
Admission: RE | Admit: 2018-10-03 | Discharge: 2018-10-03 | Disposition: A | Discharge status: Home / Self Care | Payer: Medicaid Other | Source: Ambulatory Visit | Attending: Gerontology | Admitting: Gerontology

## 2018-10-03 DIAGNOSIS — N63 Unspecified lump in unspecified breast: Secondary | ICD-10-CM | POA: Insufficient documentation

## 2018-10-04 ENCOUNTER — Other Ambulatory Visit: Payer: Self-pay

## 2018-10-04 ENCOUNTER — Encounter: Payer: Self-pay | Admitting: Gerontology

## 2018-10-04 ENCOUNTER — Ambulatory Visit: Payer: Medicaid Other | Admitting: Gerontology

## 2018-10-04 VITALS — BP 147/91 | HR 82 | Temp 98.2°F | Wt 189.2 lb

## 2018-10-04 DIAGNOSIS — Z09 Encounter for follow-up examination after completed treatment for conditions other than malignant neoplasm: Secondary | ICD-10-CM

## 2018-10-04 DIAGNOSIS — K219 Gastro-esophageal reflux disease without esophagitis: Secondary | ICD-10-CM

## 2018-10-04 DIAGNOSIS — H6122 Impacted cerumen, left ear: Secondary | ICD-10-CM

## 2018-10-04 DIAGNOSIS — F1020 Alcohol dependence, uncomplicated: Secondary | ICD-10-CM

## 2018-10-04 DIAGNOSIS — N62 Hypertrophy of breast: Secondary | ICD-10-CM

## 2018-10-04 DIAGNOSIS — I10 Essential (primary) hypertension: Secondary | ICD-10-CM

## 2018-10-04 DIAGNOSIS — N63 Unspecified lump in unspecified breast: Secondary | ICD-10-CM | POA: Insufficient documentation

## 2018-10-04 DIAGNOSIS — Z0000 Encounter for general adult medical examination without abnormal findings: Secondary | ICD-10-CM

## 2018-10-04 MED ORDER — TIZANIDINE HCL 4 MG PO TABS: 4.0000 mg | ORAL_TABLET | Freq: Four times a day (QID) | ORAL | 0 refills | Status: DC | PRN

## 2018-10-04 MED ORDER — LISINOPRIL 20 MG PO TABS: 10.0000 mg | ORAL_TABLET | Freq: Every day | ORAL | 3 refills | Status: DC

## 2018-10-04 NOTE — Progress Notes (Signed)
Patient: Miguel [name] Male    DOB: 07/13/1961   57 y.o.   MRN: [id number] Visit Date: 10/04/2018  Today's Provider: Langston Reusing, NP   Chief Complaint  Patient presents with  . Follow-up    high blood pressure   Subjective:    HPI   Miguel [name] 55 y 37 male presents for follow post hospital discharge. He was treated for UTI on 11/15 and was treated with Rocephin iv and continues to take Keflex 500 mg bid. He denies dysuria, hematuria,urgency, discharge, and reports urine color is light yellow. He denies fever, chest pain, shortness of breath.  He has Mammogram and ultrasound done 10/03/18 and reports states that Mammographically, there are no suspicious masses, areas of architectural distortion or microcalcifications in either breast. There is bilateral gynecomastia, more prominent in the left subareolar breast per Dr Thomas Hoff D report.   He reports being at the ED 10/29 for left knee pain for questionable gout, he denies pain to left knee. He continues to take Allopurinol 300 mg.   He reports falling and hitting his left thigh on his bed frame last night. He reports feeling dizzy when he got up. He denies any dizziness and light headedness at this visit. His BP is still not controlled, he continues to take 20 mg Lisinopril and monitors BP at home.  Gerd: He continues to take 40 mg Protonix daily,and reports acid reflux is better controlled.  He reports drinking 2 glasses of wine last Sunday during football game and continues to have mild hand tremor with extension.  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  He c/o having 2/10 left ear pain around the tragus when it's touched or lying on his left side, since 1 week ago after using q tips to remove ear wax from left ear. He denies tinnitus. Otherwise he reports doing well.   No Known Allergies Previous Medications   ALBUTEROL (VENTOLIN HFA)  108 (90 BASE) MCG/ACT INHALER    Inhale 2 puffs into the lungs every 4 (four) hours as needed.   ALLOPURINOL (ZYLOPRIM) 300 MG TABLET    Take 1 tablet (300 mg total) by mouth daily.   CEPHALEXIN (KEFLEX) 500 MG CAPSULE    Take 1 capsule (500 mg total) by mouth 2 (two) times daily.   CITALOPRAM (CELEXA) 10 MG TABLET    Take 1 tablet (10 mg total) by mouth at bedtime.   CLONIDINE (CATAPRES) 0.1 MG TABLET    Take 1 tablet (0.1 mg total) by mouth 2 (two) times daily.   DIPHENHYDRAMINE (BENADRYL) 50 MG TABLET    Take 1 tablet (50 mg total) by mouth at bedtime as needed for itching.   FERROUS SULFATE 325 (65 FE) MG TABLET    Take 1 tablet (325 mg total) by mouth 2 (two) times daily with a meal.   FLUTICASONE-SALMETEROL (ADVAIR DISKUS) 100-50 MCG/DOSE AEPB    Inhale 1 puff into the lungs 2 (two) times daily.   FOLIC ACID (FOLVITE) 1 MG TABLET    Take 1 tablet (1 mg total) by mouth daily.   LISINOPRIL (PRINIVIL,ZESTRIL) 20 MG TABLET    Take 0.5 tablets (10 mg total) by mouth daily.   MAGNESIUM OXIDE (MAG-OX) 400 (241.3 MG) MG TABLET    Take 1 tablet (400 mg total) by mouth daily.   MULTIPLE VITAMINS-MINERALS (MULTIVITAMIN WITH MINERALS) TABLET    Take 1 tablet by mouth daily.  OXYCODONE (ROXICODONE) 5 MG IMMEDIATE RELEASE TABLET    Take 1 tablet (5 mg total) by mouth every 6 (six) hours as needed for severe pain.   PANTOPRAZOLE (PROTONIX) 40 MG TABLET    Take 1 tablet (40 mg total) by mouth daily.   PREDNISONE (DELTASONE) 20 MG TABLET    Take 2 tablets (40 mg total) by mouth daily with breakfast.   THIAMINE (VITAMIN B-1) 100 MG TABLET    Take 1 tablet (100 mg total) by mouth daily.   TIZANIDINE (ZANAFLEX) 4 MG TABLET    Take 1 tablet (4 mg total) by mouth every 6 (six) hours as needed for muscle spasms.    Review of Systems  Constitutional: Negative.   HENT: Positive for ear pain (left ear).   Eyes: Negative.   Respiratory: Negative.  Negative for chest tightness.   Cardiovascular: Negative.     Gastrointestinal: Negative.   Genitourinary: Negative.   Musculoskeletal: Positive for myalgias (pain to lateral aspect of left thigh s/p fall).  Skin: Negative.   Neurological: Negative.   Psychiatric/Behavioral: Negative.     Social History   Tobacco Use  . Smoking status: Former Research scientist (life sciences)  . Smokeless tobacco: Never Used  . Tobacco comment: quit 30 years ago  Substance Use Topics  . Alcohol use: Not Currently    Alcohol/week: 7.0 standard drinks    Types: 7 Glasses of wine per week    Comment: per pt "i do sometimes about a glass of wine with the football game"   Objective:   BP (!) 147/91 (BP Location: Left Arm, Patient Position: Sitting)   Pulse 82   Temp 98.2 F (36.8 C)   Wt 189 lb 3.2 oz (85.8 kg)   SpO2 100%   BMI 25.66 kg/m   Physical Exam  Constitutional: He is oriented to person, place, and time. He appears well-developed and well-nourished.  HENT:  Head: Normocephalic and atraumatic.  Left Ear: There is tenderness (3/10 dull pain on palpating the left tragus).  Ears:  Eyes: Pupils are equal, round, and reactive to light. EOM are normal.  Neck: Normal range of motion.  Cardiovascular: Normal rate and regular rhythm.  Pulmonary/Chest: Effort normal and breath sounds normal.  Abdominal: Soft. Bowel sounds are normal.  Musculoskeletal: Normal range of motion. He exhibits tenderness (5/10 sharp pain to lateral aspect of left thigh).  Neurological: He is alert and oriented to person, place, and time.  Skin: Skin is warm and dry.  Psychiatric: He has a normal mood and affect. His behavior is normal. Judgment and thought content normal.        Assessment & Plan:     1. Gynecomastia Referral to breast specialist   2. Hospital discharge follow-up -Continue and finish Keflex 500 mg twice daily for UTI - Recheck Urine for urinalysis 12/10   3. Essential hypertension -Continue on low salt diet, 20 mg Lisinopril daily, monitor and document BP.  4. Impacted  cerumen of left ear - Use Carbamide peroxide otic solution over the counter, apply 5-10 drops to left ear twice daily as directed to remove wax.  5. Gastroesophageal reflux disease without esophagitis -Continue 40 mg Protonix daily  6. Health care maintenance -Stool card for colon cancer screening provided Take tylenol 650 mg every 8 hours for lateral left thigh pain  7. Alcohol use disorder, severe, dependence ([place]) -Advised on decreasing alcohol consumption         Quantavia Frith E Cacie Gaskins, NP   [facility]  [facility]

## 2018-10-04 NOTE — Patient Instructions (Signed)
Carbamide Peroxide ear solution  What is this medicine?  CARBAMIDE PEROXIDE (CAR bah mide per OX ide) is used to soften and help remove ear wax.  This medicine may be used for other purposes; ask your health care provider or pharmacist if you have questions.  COMMON BRAND NAME(S): Auro Ear, Auro Earache Relief, Debrox, Ear Drops, Ear Wax Removal, Ear Wax Remover, Earwax Treatment, Murine, Thera-Ear  What should I tell my health care provider before I take this medicine?  They need to know if you have any of these conditions:  -dizziness  -ear discharge  -ear pain, irritation or rash  -infection  -perforated eardrum (hole in eardrum)  -an unusual or allergic reaction to carbamide peroxide, glycerin, hydrogen peroxide, other medicines, foods, dyes, or preservatives  -pregnant or trying to get pregnant  -breast-feeding  How should I use this medicine?  This medicine is only for use in the outer ear canal. Follow the directions carefully. Wash hands before and after use. The solution may be warmed by holding the bottle in the hand for 1 to 2 minutes. Lie with the affected ear facing upward. Place the proper number of drops into the ear canal. After the drops are instilled, remain lying with the affected ear upward for 5 minutes to help the drops stay in the ear canal. A cotton ball may be gently inserted at the ear opening for no longer than 5 to 10 minutes to ensure retention. Repeat, if necessary, for the opposite ear. Do not touch the tip of the dropper to the ear, fingertips, or other surface. Do not rinse the dropper after use. Keep container tightly closed.  Talk to your pediatrician regarding the use of this medicine in children. While this drug may be used in children as young as 12 years for selected conditions, precautions do apply.  Overdosage: If you think you have taken too much of this medicine contact a poison control center or emergency room at once.   NOTE: This medicine is only for you. Do not share this medicine with others.  What if I miss a dose?  If you miss a dose, use it as soon as you can. If it is almost time for your next dose, use only that dose. Do not use double or extra doses.  What may interact with this medicine?  Interactions are not expected. Do not use any other ear products without asking your doctor or health care professional.  This list may not describe all possible interactions. Give your health care provider a list of all the medicines, herbs, non-prescription drugs, or dietary supplements you use. Also tell them if you smoke, drink alcohol, or use illegal drugs. Some items may interact with your medicine.  What should I watch for while using this medicine?  This medicine is not for long-term use. Do not use for more than 4 days without checking with your health care professional. Contact your doctor or health care professional if your condition does not start to get better within a few days or if you notice burning, redness, itching or swelling.  What side effects may I notice from receiving this medicine?  Side effects that you should report to your doctor or health care professional as soon as possible:  -allergic reactions like skin rash, itching or hives, swelling of the face, lips, or tongue  -burning, itching, and redness  -worsening ear pain  -rash  Side effects that usually do not require medical attention (report to your doctor   or health care professional if they continue or are bothersome):  -abnormal sensation while putting the drops in the ear  -temporary reduction in hearing (but not complete loss of hearing)  This list may not describe all possible side effects. Call your doctor for medical advice about side effects. You may report side effects to FDA at 1-800-FDA-1088.  Where should I keep my medicine?  Keep out of the reach of children.  Store at room temperature between 15 and 30 degrees C (59 and 86 degrees  F) in a tight, light-resistant container. Keep bottle away from excessive heat and direct sunlight. Throw away any unused medicine after the expiration date.  NOTE: This sheet is a summary. It may not cover all possible information. If you have questions about this medicine, talk to your doctor, pharmacist, or health care provider.  © 2018 [employer]/Gold Standard (2008-02-14 14:00:02)

## 2018-10-05 LAB — CULTURE, BLOOD (ROUTINE X 2)
Culture: NO GROWTH
Culture: NO GROWTH
Special Requests: ADEQUATE
Special Requests: ADEQUATE

## 2018-10-06 ENCOUNTER — Other Ambulatory Visit: Payer: Self-pay

## 2018-10-06 ENCOUNTER — Emergency Department: Payer: Self-pay

## 2018-10-06 ENCOUNTER — Emergency Department
Admission: EM | Admit: 2018-10-06 | Discharge: 2018-10-06 | Disposition: A | Discharge status: Home / Self Care | Payer: Self-pay | Attending: Emergency Medicine | Admitting: Emergency Medicine

## 2018-10-06 ENCOUNTER — Encounter: Payer: Self-pay | Admitting: Emergency Medicine

## 2018-10-06 DIAGNOSIS — R0602 Shortness of breath: Secondary | ICD-10-CM | POA: Insufficient documentation

## 2018-10-06 DIAGNOSIS — J45909 Unspecified asthma, uncomplicated: Secondary | ICD-10-CM | POA: Insufficient documentation

## 2018-10-06 DIAGNOSIS — I10 Essential (primary) hypertension: Secondary | ICD-10-CM | POA: Insufficient documentation

## 2018-10-06 DIAGNOSIS — Z79899 Other long term (current) drug therapy: Secondary | ICD-10-CM | POA: Insufficient documentation

## 2018-10-06 DIAGNOSIS — Z87891 Personal history of nicotine dependence: Secondary | ICD-10-CM | POA: Insufficient documentation

## 2018-10-06 LAB — TROPONIN I: Troponin I: <0.03 ng/mL (ref <0.03)

## 2018-10-06 LAB — COMPREHENSIVE METABOLIC PANEL
ALT: 9 U/L (ref 0–44)
AST: 11 U/L — ABNORMAL LOW (ref 15–41)
Albumin: 3.9 g/dL (ref 3.5–5.0)
Alkaline Phosphatase: 59 U/L (ref 38–126)
Anion gap: 10 (ref 5–15)
BUN: 18 mg/dL (ref 6–20)
CO2: 20 mmol/L — ABNORMAL LOW (ref 22–32)
Calcium: 8.8 mg/dL — ABNORMAL LOW (ref 8.9–10.3)
Chloride: 113 mmol/L — ABNORMAL HIGH (ref 98–111)
Creatinine, Ser: 1.11 mg/dL (ref 0.61–1.24)
GFR calc Af Amer: >60 mL/min (ref >60)
GFR calc non Af Amer: >60 mL/min (ref >60)
Glucose, Bld: 111 mg/dL — ABNORMAL HIGH (ref 70–99)
Potassium: 4.2 mmol/L (ref 3.5–5.1)
Sodium: 143 mmol/L (ref 135–145)
Total Bilirubin: 0.3 mg/dL (ref 0.3–1.2)
Total Protein: 6.7 g/dL (ref 6.5–8.1)

## 2018-10-06 LAB — CBC
HCT: 35.8 % — ABNORMAL LOW (ref 39.0–52.0)
Hemoglobin: 11.3 g/dL — ABNORMAL LOW (ref 13.0–17.0)
MCH: 25.6 pg — ABNORMAL LOW (ref 26.0–34.0)
MCHC: 31.6 g/dL (ref 30.0–36.0)
MCV: 81 fL (ref 80.0–100.0)
Platelets: 190 10*3/uL (ref 150–400)
RBC: 4.42 MIL/uL (ref 4.22–5.81)
RDW: 19.3 % — ABNORMAL HIGH (ref 11.5–15.5)
WBC: 5 10*3/uL (ref 4.0–10.5)
nRBC: 0 % (ref 0.0–0.2)

## 2018-10-06 MED ORDER — ALBUTEROL SULFATE (2.5 MG/3ML) 0.083% IN NEBU
5.0000 mg | INHALATION_SOLUTION | Freq: Once | RESPIRATORY_TRACT | Status: AC
  Administered 2018-10-06: 5 mg via RESPIRATORY_TRACT

## 2018-10-06 MED ORDER — ALBUTEROL SULFATE (2.5 MG/3ML) 0.083% IN NEBU
INHALATION_SOLUTION | RESPIRATORY_TRACT | Status: AC
  Administered 2018-10-06: 5 mg via RESPIRATORY_TRACT
  Filled 2018-10-06: qty 6

## 2018-10-06 NOTE — ED Provider Notes (Signed)
[facility] Emergency Department Provider Note  Time seen: 5:29 PM  I have reviewed the triage vital signs and the nursing notes.   HISTORY  Chief Complaint Fall and Shortness of Breath    HPI Miguel [name] is a 57 y.o. male with a past medical history of alcohol abuse, asthma, gastric reflux, hypertension, presents to the emergency department for trouble breathing.  According to the patient he has been feeling short of breath since this morning.  Took his albuterol inhaler, and states he does feel better.  Denies any shortness of breath at this time.  Also states he was having chest pain earlier today although denies any chest pain currently.  States he has been having intermittent abdominal pain over the past several months.  Denies any vomiting or diarrhea.  Denies dysuria or hematuria.  Also states he had a fall last night and landed on his left hip.  Has been ambulatory since fall without issue.   Past Medical History:  Diagnosis Date  . Alcohol abuse   . Asthma   . GERD (gastroesophageal reflux disease)   . Gout   . Hypertension   . OCD (obsessive compulsive disorder)   . Renal colic     Patient Active Problem List   Diagnosis Date Noted  . Breast lump or mass 10/04/2018  . Sepsis secondary to UTI ([place]) 09/14/2018  . Asthma 07/07/2018  . GERD (gastroesophageal reflux disease) 07/07/2018  . OCD (obsessive compulsive disorder) 07/07/2018  . Self-inflicted laceration of wrist 03/10/2018  . Leg hematoma 12/25/2017  . Suicide and self-inflicted injury by cutting and piercing instrument ([place]) 03/10/2017  . Severe recurrent major 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 ([place]) 03/09/2017  . Substance induced mood disorder ([place]) 08/15/2016  . Involuntary commitment 08/15/2016  . Alcohol use disorder, severe, dependence ([place]) 02/05/2016  . Hypertension 12/05/2015  . Tachycardia 12/05/2015  . Gout 11/13/2015  . Chronic back pain 05/02/2015    Past Surgical  History:  Procedure Laterality Date  . CHOLECYSTECTOMY  2012    Prior to Admission medications   Medication Sig Start Date End Date Taking? Authorizing Provider  albuterol (VENTOLIN HFA) 108 (90 Base) MCG/ACT inhaler Inhale 2 puffs into the lungs every 4 (four) hours as needed. 09/01/18   Tukov-Yual, Arlyss Gandy, NP  allopurinol (ZYLOPRIM) 300 MG tablet Take 1 tablet (300 mg total) by mouth daily. 09/01/18   Tukov-Yual, Arlyss Gandy, NP  cephALEXin (KEFLEX) 500 MG capsule Take 1 capsule (500 mg total) by mouth 2 (two) times daily. 09/30/18   Lisa Roca, MD  citalopram (CELEXA) 10 MG tablet Take 1 tablet (10 mg total) by mouth at bedtime. 09/01/18   Tukov-Yual, Arlyss Gandy, NP  ferrous sulfate 325 (65 FE) MG tablet Take 1 tablet (325 mg total) by mouth 2 (two) times daily with a meal. 09/01/18   Tukov-Yual, Magdalene S, NP  Fluticasone-Salmeterol (ADVAIR DISKUS) 100-50 MCG/DOSE AEPB Inhale 1 puff into the lungs 2 (two) times daily. 09/01/18   Tukov-Yual, Arlyss Gandy, NP  folic acid (FOLVITE) 1 MG tablet Take 1 tablet (1 mg total) by mouth daily. 09/01/18   Tukov-Yual, Arlyss Gandy, NP  lisinopril (PRINIVIL,ZESTRIL) 20 MG tablet Take 0.5 tablets (10 mg total) by mouth daily. 10/04/18   Iloabachie, Chioma E, NP  magnesium oxide (MAG-OX) 400 (241.3 Mg) MG tablet Take 1 tablet (400 mg total) by mouth daily. 09/01/18   Tukov-Yual, Arlyss Gandy, NP  Multiple Vitamins-Minerals (MULTIVITAMIN WITH MINERALS) tablet Take 1 tablet by mouth daily.  09/01/18   Tukov-Yual, Arlyss Gandy, NP  pantoprazole (PROTONIX) 40 MG tablet Take 1 tablet (40 mg total) by mouth daily. 09/01/18   Tukov-Yual, Arlyss Gandy, NP  predniSONE (DELTASONE) 20 MG tablet Take 2 tablets (40 mg total) by mouth daily with breakfast. 09/16/18   Mayo, Pete Pelt, MD  thiamine (VITAMIN B-1) 100 MG tablet Take 1 tablet (100 mg total) by mouth daily. 09/01/18   Tukov-Yual, Arlyss Gandy, NP  tiZANidine (ZANAFLEX) 4 MG tablet Take 1 tablet (4 mg total)  by mouth every 6 (six) hours as needed for muscle spasms. 10/04/18   Iloabachie, Chioma E, NP    No Known Allergies  Family History  Problem Relation Age of Onset  . Alcohol abuse Father   . Breast cancer Mother 71    Social History Social History   Tobacco Use  . Smoking status: Former Research scientist (life sciences)  . Smokeless tobacco: Never Used  . Tobacco comment: quit 30 years ago  Substance Use Topics  . Alcohol use: Not Currently    Alcohol/week: 7.0 standard drinks    Types: 7 Glasses of wine per week    Comment: per pt "i do sometimes about a glass of wine with the football game"  . Drug use: No    Review of Systems Constitutional: Negative for fever. Cardiovascular: Chest pain earlier today, now resolved Respiratory: Positive shortness of breath, now resolved Gastrointestinal: Intermittent abdominal pain times months, negative for nausea vomiting or diarrhea Genitourinary: Negative for urinary compaints Musculoskeletal: Negative for musculoskeletal complaints Skin: Negative for skin complaints  Neurological: Negative for headache All other ROS negative  ____________________________________________   PHYSICAL EXAM:  VITAL SIGNS: ED Triage Vitals  Enc Vitals Group     BP 10/06/18 1525 (!) 113/46     Pulse Rate 10/06/18 1525 (!) 112     Resp 10/06/18 1525 18     Temp 10/06/18 1525 97.8 F (36.6 C)     Temp Source 10/06/18 1525 Oral     SpO2 10/06/18 1525 100 %     Weight 10/06/18 1526 190 lb (86.2 kg)     Height 10/06/18 1526 6' (1.829 m)     Head Circumference --      Peak Flow --      Pain Score 10/06/18 1550 6     Pain Loc --      Pain Edu? --      Excl. in [place]? --    Constitutional: Alert and oriented. Well appearing and in no distress. Eyes: Normal exam ENT   Head: Normocephalic and atraumatic   Mouth/Throat: Mucous membranes are moist. Cardiovascular: Normal rate, regular rhythm.  Respiratory: Normal respiratory effort without tachypnea nor retractions.  Breath sounds are clear Gastrointestinal: Soft and nontender. No distention. Musculoskeletal: Nontender with normal range of motion in all extremities.  Neurologic:  Normal speech and language. No gross focal neurologic deficits  Skin:  Skin is warm, dry and intact.  Psychiatric: Mood and affect are normal.   ____________________________________________    EKG  EKG reviewed and interpreted by myself shows a normal sinus rhythm at 91 bpm with a narrow QRS, normal axis, normal intervals, no concerning ST changes.  ____________________________________________    RADIOLOGY  Chest x-ray negative Pelvis x-ray negative  ____________________________________________   INITIAL IMPRESSION / ASSESSMENT AND PLAN / ED COURSE  Pertinent labs & imaging results that were available during my care of the patient were reviewed by me and considered in my medical decision making (see chart for details).  Patient presents to the emergency department with various complaints.  His only initial complaint was shortness of breath and a fall last night.  Throughout review of systems patient also states he was expensing chest pain this morning which is since resolved, abdominal pain times months.  Patient is somewhat anxious in appearance, states his doctor will not give him anything for anxiety.  Uses albuterol inhaler earlier today and states it helped.  Clear lung sounds currently without wheeze.  Given the complaint of chest pain earlier today and shortness of breath we will check labs including cardiac enzymes chest x-ray and EKG.  Patient does state a fall earlier today with some left hip discomfort although the patient has been ambulatory and has great range of motion of the left hip.  Will obtain a 1 view pelvis x-ray.  X-rays are negative.  Labs are largely at baseline/within normal limits.  Negative troponin.  Reassuring EKG, negative chest x-ray.  Patient feels well currently, denies any shortness of  breath at this time, has albuterol and Advair to use at home.  With a negative work-up and asymptomatic patient believe the patient is safe for discharge home with PCP follow-up.  Patient agreeable to plan of care.  ____________________________________________   FINAL CLINICAL IMPRESSION(S) / ED DIAGNOSES  Shortness of breath Cheral Marker, MD 10/06/18 1843

## 2018-10-06 NOTE — ED Notes (Signed)
Patient verbalized understanding of discharge instructions, no questions. Patient ambulated out of ED with steady gait in no distress.  

## 2018-10-06 NOTE — ED Notes (Signed)
Pt moved to room 5h.   Report off to [facility]

## 2018-10-06 NOTE — ED Notes (Signed)
Patient transported to X-ray 

## 2018-10-06 NOTE — ED Triage Notes (Addendum)
Patient reports that he lost his balance and fell earlier this morning. Denies hitting head or LOC. Patient reports pain to left hip. Patient ambulating around room with NAD noted. Patient also complaining of shortness of breath. Patient sitting quietly in chair with even unlabored respirations when this RN entered room. During triage process patient began to cough and have audible wheezing.

## 2018-10-07 ENCOUNTER — Telehealth: Payer: Self-pay

## 2018-10-07 NOTE — Telephone Encounter (Signed)
I spoke with Miguel [name] at [facility]. She has arranged appt for pt with [facility], Dr Rosana Hoes, on 12/5 at 1:30pm. She stated this possibly will be paid with funds through [employer]. She has spoken with patient regarding this appt.. Nothing further is needed from [facility] at this time.

## 2018-10-11 ENCOUNTER — Telehealth: Payer: Self-pay | Admitting: Pharmacist

## 2018-10-11 NOTE — Telephone Encounter (Signed)
10/11/2018 10:03:48 AM - Refills-Advair & Ventolin  10/11/18 Placed refills online with [place] for Advair 100/50 & Ventolin HFA, will release 11/14/2018, order# [id number].Delos Haring

## 2018-10-20 ENCOUNTER — Ambulatory Visit: Payer: Self-pay | Admitting: Surgery

## 2018-10-21 ENCOUNTER — Emergency Department
Admission: EM | Admit: 2018-10-21 | Discharge: 2018-10-21 | Disposition: A | Discharge status: Home / Self Care | Payer: Medicaid Other | Attending: Emergency Medicine | Admitting: Emergency Medicine

## 2018-10-21 ENCOUNTER — Other Ambulatory Visit: Payer: Self-pay

## 2018-10-21 DIAGNOSIS — R109 Unspecified abdominal pain: Secondary | ICD-10-CM | POA: Insufficient documentation

## 2018-10-21 DIAGNOSIS — Z87891 Personal history of nicotine dependence: Secondary | ICD-10-CM | POA: Insufficient documentation

## 2018-10-21 DIAGNOSIS — F1010 Alcohol abuse, uncomplicated: Secondary | ICD-10-CM

## 2018-10-21 DIAGNOSIS — J45909 Unspecified asthma, uncomplicated: Secondary | ICD-10-CM | POA: Insufficient documentation

## 2018-10-21 DIAGNOSIS — Z79899 Other long term (current) drug therapy: Secondary | ICD-10-CM | POA: Insufficient documentation

## 2018-10-21 DIAGNOSIS — R112 Nausea with vomiting, unspecified: Secondary | ICD-10-CM | POA: Insufficient documentation

## 2018-10-21 DIAGNOSIS — I10 Essential (primary) hypertension: Secondary | ICD-10-CM | POA: Insufficient documentation

## 2018-10-21 LAB — CBC WITH DIFFERENTIAL/PLATELET
Abs Immature Granulocytes: 0.03 10*3/uL (ref 0.00–0.07)
Basophils Absolute: 0 10*3/uL (ref 0.0–0.1)
Basophils Relative: 1 %
Eosinophils Absolute: 0 10*3/uL (ref 0.0–0.5)
Eosinophils Relative: 0 %
HCT: 38.7 % — ABNORMAL LOW (ref 39.0–52.0)
Hemoglobin: 12.5 g/dL — ABNORMAL LOW (ref 13.0–17.0)
Immature Granulocytes: 0 %
Lymphocytes Relative: 14 %
Lymphs Abs: 0.9 10*3/uL (ref 0.7–4.0)
MCH: 25.9 pg — ABNORMAL LOW (ref 26.0–34.0)
MCHC: 32.3 g/dL (ref 30.0–36.0)
MCV: 80.3 fL (ref 80.0–100.0)
Monocytes Absolute: 0.7 10*3/uL (ref 0.1–1.0)
Monocytes Relative: 11 %
Neutro Abs: 5.1 10*3/uL (ref 1.7–7.7)
Neutrophils Relative %: 74 %
Platelets: 265 10*3/uL (ref 150–400)
RBC: 4.82 MIL/uL (ref 4.22–5.81)
RDW: 19.3 % — ABNORMAL HIGH (ref 11.5–15.5)
WBC: 6.8 10*3/uL (ref 4.0–10.5)
nRBC: 0 % (ref 0.0–0.2)

## 2018-10-21 LAB — COMPREHENSIVE METABOLIC PANEL
ALT: 15 U/L (ref 0–44)
AST: 23 U/L (ref 15–41)
Albumin: 4 g/dL (ref 3.5–5.0)
Alkaline Phosphatase: 65 U/L (ref 38–126)
Anion gap: 16 — ABNORMAL HIGH (ref 5–15)
BUN: 22 mg/dL — ABNORMAL HIGH (ref 6–20)
CO2: 22 mmol/L (ref 22–32)
Calcium: 8.5 mg/dL — ABNORMAL LOW (ref 8.9–10.3)
Chloride: 104 mmol/L (ref 98–111)
Creatinine, Ser: 1.27 mg/dL — ABNORMAL HIGH (ref 0.61–1.24)
GFR calc Af Amer: >60 mL/min (ref >60)
GFR calc non Af Amer: >60 mL/min (ref >60)
Glucose, Bld: 128 mg/dL — ABNORMAL HIGH (ref 70–99)
Potassium: 3.8 mmol/L (ref 3.5–5.1)
Sodium: 142 mmol/L (ref 135–145)
Total Bilirubin: 1 mg/dL (ref 0.3–1.2)
Total Protein: 7 g/dL (ref 6.5–8.1)

## 2018-10-21 LAB — LIPASE, BLOOD: Lipase: 23 U/L (ref 11–51)

## 2018-10-21 MED ORDER — LORAZEPAM 1 MG PO TABS
ORAL_TABLET | ORAL | Status: AC
  Filled 2018-10-21: qty 1

## 2018-10-21 MED ORDER — LORAZEPAM 1 MG PO TABS
1.0000 mg | ORAL_TABLET | Freq: Once | ORAL | Status: AC
  Administered 2018-10-21: 1 mg via ORAL

## 2018-10-21 MED ORDER — ONDANSETRON HCL 4 MG/2ML IJ SOLN
INTRAMUSCULAR | Status: AC
  Filled 2018-10-21: qty 2

## 2018-10-21 MED ORDER — ONDANSETRON HCL 4 MG/2ML IJ SOLN
4.0000 mg | Freq: Once | INTRAMUSCULAR | Status: AC
  Administered 2018-10-21: 4 mg via INTRAVENOUS

## 2018-10-21 MED ORDER — SODIUM CHLORIDE 0.9 % IV BOLUS
1000.0000 mL | Freq: Once | INTRAVENOUS | Status: AC
  Administered 2018-10-21: 1000 mL via INTRAVENOUS

## 2018-10-21 NOTE — ED Notes (Signed)
Red, Lav, Grn tops sent to lab.

## 2018-10-21 NOTE — ED Notes (Signed)
Pt attempted to sign off on d/c paperwork but topaz is frozen.

## 2018-10-21 NOTE — Discharge Instructions (Addendum)
Please seek medical attention for any high fevers, chest pain, shortness of breath, change in behavior, persistent vomiting, bloody stool or any other new or concerning symptoms.  

## 2018-10-21 NOTE — ED Notes (Signed)
Miguel [name] notified in person pt requesting Ativan.

## 2018-10-21 NOTE — ED Notes (Signed)
Miguel [name] notified in person of pt's CIWA score. Pt actively vomiting.

## 2018-10-21 NOTE — ED Triage Notes (Signed)
Pt brought in via EMS from home. Pt c/o N/V/abd pain; last drink of vodka yest, EMS gave zofran IM, BG 135, HR 130, BP 200/110 per EMS, hist HTN, pt has obvious tremors.

## 2018-10-21 NOTE — ED Provider Notes (Signed)
[facility] Emergency Department Provider Note   ____________________________________________   I have reviewed the triage vital signs and the nursing notes.   HISTORY  Chief Complaint Withdrawal and Emesis   History limited by: Not Limited   HPI Miguel [name] is a 57 y.o. male who presents to the emergency department today because of concerns for feeling unwell.  Patient states that he has had nausea vomiting and abdominal discomfort.  He also feels dehydrated.  Patient does state that he drank 2 pints of alcohol last night.  He states this is more than he normally drinks.  He states that at this time he does not want to drink anymore.   Per medical record review patient has a history of alcohol abuse, frequent visits to the emergency department.  Past Medical History:  Diagnosis Date  . Alcohol abuse   . Asthma   . GERD (gastroesophageal reflux disease)   . Gout   . Hypertension   . OCD (obsessive compulsive disorder)   . Renal colic     Patient Active Problem List   Diagnosis Date Noted  . Breast lump or mass 10/04/2018  . Sepsis secondary to UTI ([place]) 09/14/2018  . Asthma 07/07/2018  . GERD (gastroesophageal reflux disease) 07/07/2018  . OCD (obsessive compulsive disorder) 07/07/2018  . Self-inflicted laceration of wrist 03/10/2018  . Leg hematoma 12/25/2017  . Suicide and self-inflicted injury by cutting and piercing instrument ([place]) 03/10/2017  . Severe recurrent major 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 ([place]) 03/09/2017  . Substance induced mood disorder ([place]) 08/15/2016  . Involuntary commitment 08/15/2016  . Alcohol use disorder, severe, dependence ([place]) 02/05/2016  . Hypertension 12/05/2015  . Tachycardia 12/05/2015  . Gout 11/13/2015  . Chronic back pain 05/02/2015    Past Surgical History:  Procedure Laterality Date  . CHOLECYSTECTOMY  2012    Prior to Admission medications   Medication Sig Start Date End Date Taking?  Authorizing Provider  albuterol (VENTOLIN HFA) 108 (90 Base) MCG/ACT inhaler Inhale 2 puffs into the lungs every 4 (four) hours as needed. 09/01/18   Tukov-Yual, Arlyss Gandy, NP  allopurinol (ZYLOPRIM) 300 MG tablet Take 1 tablet (300 mg total) by mouth daily. 09/01/18   Tukov-Yual, Arlyss Gandy, NP  cephALEXin (KEFLEX) 500 MG capsule Take 1 capsule (500 mg total) by mouth 2 (two) times daily. 09/30/18   Lisa Roca, MD  citalopram (CELEXA) 10 MG tablet Take 1 tablet (10 mg total) by mouth at bedtime. 09/01/18   Tukov-Yual, Arlyss Gandy, NP  ferrous sulfate 325 (65 FE) MG tablet Take 1 tablet (325 mg total) by mouth 2 (two) times daily with a meal. 09/01/18   Tukov-Yual, Magdalene S, NP  Fluticasone-Salmeterol (ADVAIR DISKUS) 100-50 MCG/DOSE AEPB Inhale 1 puff into the lungs 2 (two) times daily. 09/01/18   Tukov-Yual, Arlyss Gandy, NP  folic acid (FOLVITE) 1 MG tablet Take 1 tablet (1 mg total) by mouth daily. 09/01/18   Tukov-Yual, Arlyss Gandy, NP  lisinopril (PRINIVIL,ZESTRIL) 20 MG tablet Take 0.5 tablets (10 mg total) by mouth daily. 10/04/18   Iloabachie, Chioma E, NP  magnesium oxide (MAG-OX) 400 (241.3 Mg) MG tablet Take 1 tablet (400 mg total) by mouth daily. 09/01/18   Tukov-Yual, Arlyss Gandy, NP  Multiple Vitamins-Minerals (MULTIVITAMIN WITH MINERALS) tablet Take 1 tablet by mouth daily. 09/01/18   Tukov-Yual, Arlyss Gandy, NP  pantoprazole (PROTONIX) 40 MG tablet Take 1 tablet (40 mg total) by mouth daily. 09/01/18   Tukov-Yual, Arlyss Gandy, NP  predniSONE (  DELTASONE) 20 MG tablet Take 2 tablets (40 mg total) by mouth daily with breakfast. 09/16/18   Mayo, Pete Pelt, MD  thiamine (VITAMIN B-1) 100 MG tablet Take 1 tablet (100 mg total) by mouth daily. 09/01/18   Tukov-Yual, Arlyss Gandy, NP  tiZANidine (ZANAFLEX) 4 MG tablet Take 1 tablet (4 mg total) by mouth every 6 (six) hours as needed for muscle spasms. 10/04/18   Iloabachie, Chioma E, NP    Allergies Patient has no known  allergies.  Family History  Problem Relation Age of Onset  . Alcohol abuse Father   . Breast cancer Mother 20    Social History Social History   Tobacco Use  . Smoking status: Former Research scientist (life sciences)  . Smokeless tobacco: Never Used  . Tobacco comment: quit 30 years ago  Substance Use Topics  . Alcohol use: Not Currently    Alcohol/week: 7.0 standard drinks    Types: 7 Glasses of wine per week    Comment: per pt "i do sometimes about a glass of wine with the football game"  . Drug use: No    Review of Systems Constitutional: No fever/chills Eyes: No visual changes. ENT: No sore throat. Cardiovascular: Denies chest pain. Respiratory: Denies shortness of breath. Gastrointestinal: Positive for abdominal discomfort, nausea vomiting Genitourinary: Negative for dysuria. Musculoskeletal: Negative for back pain. Skin: Negative for rash. Neurological: Negative for headaches, focal weakness or numbness.  ____________________________________________   PHYSICAL EXAM:  VITAL SIGNS: ED Triage Vitals  Enc Vitals Group     BP 10/21/18 1757 (!) 168/100     Pulse Rate 10/21/18 1757 (!) 107     Resp 10/21/18 1758 18     Temp 10/21/18 1757 97.9 F (36.6 C)     Temp Source 10/21/18 1757 Oral     SpO2 10/21/18 1757 100 %     Weight 10/21/18 1758 185 lb (83.9 kg)     Height 10/21/18 1758 6' (1.829 m)     Head Circumference --      Peak Flow --    Constitutional: Alert and oriented.  Eyes: Conjunctivae are normal.  ENT      Head: Normocephalic and atraumatic.      Nose: No congestion/rhinnorhea.      Mouth/Throat: Mucous membranes are moist.      Neck: No stridor. Hematological/Lymphatic/Immunilogical: No cervical lymphadenopathy. Cardiovascular: Tachycardic, regular rhythm.  No murmurs, rubs, or gallops.  Respiratory: Normal respiratory effort without tachypnea nor retractions. Breath sounds are clear and equal bilaterally. No wheezes/rales/rhonchi. Gastrointestinal: Soft and non  tender. No rebound. No guarding.  Genitourinary: Deferred Musculoskeletal: Normal range of motion in all extremities. No lower extremity edema. Neurologic:  Normal speech and language. No gross focal neurologic deficits are appreciated.  Skin:  Skin is warm, dry and intact. No rash noted. Psychiatric: Mood and affect are normal. Speech and behavior are normal. Patient exhibits appropriate insight and judgment.  ____________________________________________    LABS (pertinent positives/negatives)  Lipase 23 CMP na 142, k 3.8, cr 1.27, ca 8.5 CBC wbc 6.8, hgb 12.5, plt 265  ____________________________________________   EKG  None  ____________________________________________    RADIOLOGY  None  ____________________________________________   PROCEDURES  Procedures  ____________________________________________   INITIAL IMPRESSION / ASSESSMENT AND PLAN / ED COURSE  Pertinent labs & imaging results that were available during my care of the patient were reviewed by me and considered in my medical decision making (see chart for details).   Patient presented to the emergency department today because of  concerns for some nausea vomiting and abdominal discomfort actuator drinking large amount of alcohol last night.  Patient does have a history of frequent emergency department visits and alcohol abuse.  On exam here patient appears in his normal state of health from when I have seen him previously.  Blood work without concerning leukocytosis, elevated lipase.  Patient stated he did feel somewhat better after nausea medications and IV fluids.  At this point do not think any emergent imaging is necessary.  Will plan on discharging.   ____________________________________________   FINAL CLINICAL IMPRESSION(S) / ED DIAGNOSES  Final diagnoses:  Alcohol abuse     Note: This dictation was prepared with Dragon dictation. Any transcriptional errors that result from this process  are unintentional     Nance Pear, MD 10/21/18 2059

## 2018-10-21 NOTE — ED Notes (Addendum)
Pt requesting to speak with EDP before d/c.

## 2018-10-21 NOTE — ED Notes (Signed)
EDP notified in person pt wanting to speak with him before d/c.

## 2018-10-25 ENCOUNTER — Encounter: Payer: Self-pay | Admitting: Surgery

## 2018-10-25 ENCOUNTER — Other Ambulatory Visit: Payer: Self-pay

## 2018-10-25 ENCOUNTER — Ambulatory Visit (INDEPENDENT_AMBULATORY_CARE_PROVIDER_SITE_OTHER): Payer: Self-pay | Admitting: Surgery

## 2018-10-25 VITALS — BP 168/115 | HR 110 | Temp 97.9°F | Resp 22 | Ht 72.0 in | Wt 188.4 lb

## 2018-10-25 DIAGNOSIS — N62 Hypertrophy of breast: Secondary | ICD-10-CM

## 2018-10-25 NOTE — Patient Instructions (Addendum)
Patient is to return to the office as needed.  Call the office with any questions or concerns.    Finding Treatment for Addiction What is addiction? Addiction is a complex disease of the brain. It causes an uncontrollable (compulsive) need for a substance. You can be addicted to alcohol, illegal drugs, or prescription medicines such as painkillers. Addiction can also be a behavior, like gambling or shopping. The need for the drug or activity can become so strong that you think about it all the time. You can also become physically dependent on a substance. Addiction can change the way your brain works. Because of these changes, getting more of whatever you are addicted to becomes the most important thing to you and feels better than other activities or relationships. Addiction can lead to changes in health, behavior, emotions, relationships, and choices that affect you and everyone around you. How do I know if I need treatment for addiction? Addiction is a progressive disease. Without treatment, addiction can get worse. Living with addiction puts you at higher risk for injury, poor health, lost employment, loss of money, and even death. You might need treatment for addiction if:  You have tried to stop or cut down, but you cannot.  Your addiction is causing physical health problems.  You find it annoying that your friends and family are concerned about your alcohol or substance use.  You feel guilty about substance abuse or a compulsive behavior.  You have lied or tried to hide your addiction.  You need a particular substance or activity to start your day or to calm down.  You are getting in trouble at school, work, home, or with the police.  You have done something illegal to support your addiction.  You are running out of money because of your addiction.  You have no time for anything other than your addiction.  What types of treatment are available? The treatment program that is  right for you will depend on many factors, including the type of addiction you have. Treatment programs can be outpatient or inpatient. In an outpatient program, you live at home and go to work or school, but you also go to a clinic for treatment. With an inpatient program, you live and sleep at the program facility during treatment. After treatment, you might need a plan for support during recovery. Other treatment options include:  Medicine. ? Some addictions may be treated with prescription medicines. ? You might also need medicine to treat anxiety or depression.  Counseling and behavior therapy. Therapy can help individuals and families behave in healthier ways and relate more effectively.  Support groups. Confidential group therapy, such as a 12-step program, can help individuals and families during treatment and recovery.  No single type of program is right for everyone. Many treatment programs involve a combination of education, counseling, and a 12-step, spiritually-based approach. Some treatment programs are government sponsored. They are geared for patients who do not have private insurance. Treatment programs can vary in many respects, such as:  Cost and types of insurance that are accepted.  Types of on-site medical services that are offered.  Length of stay, setting, and size.  Overall philosophy of treatment.  What should I consider when selecting a treatment program? It is important to think about your individual requirements when selecting a treatment program. There are a number of things to consider, such as:  If the program is certified by the appropriate government agency. Even private programs must be certified and  employ certified professionals.  If the program is covered by your insurance. If finances are a concern, the first call you should make is to your insurance company, if you have health insurance. Ask for a list of treatment programs that are in your network,  and confirm any copayments and deductibles that you may have to pay. ? If you do not have insurance, or if you choose to attend a program that does not accept your insurance, discuss whether a payment plan can be set up.  If treatment is available in languages other than English, if needed.  If the program offers detoxification treatment, if needed.  If 12-step meetings are held at the center or if transport is available for patients to attend meetings at other locations.  If the program is professional, organized, and clean.  If the program meets all of your needs, including physical and cultural needs.  If the facility offers specific treatment for your particular addiction.  If support continues to be offered after you have left the program.  If your treatment plan is continually looked at to make sure you are receiving the right treatment at the right time.  If mental health counseling is part of your treatment.  If medicine is included in treatment, if needed.  If your family is included in your treatment plan and if support is offered to them throughout the treatment process.  How the treatment works to prevent relapse.  Where else can I get help?  Your health care provider. Ask him or her to help you find addiction treatment. These discussions are confidential.  The [employer] on Alcoholism and Drug Dependence (NCADD). This group has information about treatment centers and programs for people who have an addiction and for family members. ? The telephone number is 1-800-NCA-CALL (856-236-7149). ? The website is https://ncadd.org/about-ncadd/our-affiliates  The Substance Abuse and Mental Health Services Administration [facility]). This group will help you find publicly funded treatment centers, help hotlines, and counseling services near you. ? The telephone number is 1-800-662-HELP 952-579-2591). ? The website is www.findtreatment.[employer].com.cy In countries outside of  the [place] and [place], look in [employer] for contact information for services in your area. This information is not intended to replace advice given to you by your health care provider. Make sure you discuss any questions you have with your health care provider. Document Released: 10/01/2005 Document Revised: 09/28/2016 Document Reviewed: 08/21/2014 Elsevier Interactive Patient Education  2017 Elsevier Inc.  Alcohol Use Disorder Alcohol use disorder is when your drinking disrupts your daily life. When you have this condition, you drink too much alcohol and you cannot control your drinking. Alcohol use disorder can cause serious problems with your physical health. It can affect your brain, heart, liver, pancreas, immune system, stomach, and intestines. Alcohol use disorder can increase your risk for certain cancers and cause problems with your mental health, such as depression, anxiety, psychosis, delirium, and dementia. People with this disorder risk hurting themselves and others. What are the causes? This condition is caused by drinking too much alcohol over time. It is not caused by drinking too much alcohol only one or two times. Some people with this condition drink alcohol to cope with or escape from negative life events. Others drink to relieve pain or symptoms of mental illness. What increases the risk? You are more likely to develop this condition if:  You have a family history of alcohol use disorder.  Your culture encourages drinking to the point of intoxication, or makes  alcohol easy to get.  You had a mood or conduct disorder in childhood.  You have been a victim of abuse.  You are an adolescent and: ? You have poor grades or difficulties in school. ? Your caregivers do not talk to you about saying no to alcohol, or supervise your activities. ? You are impulsive or you have trouble with self-control.  What are the signs or symptoms? Symptoms of this condition  include:  Drinkingmore than you want to.  Drinking for longer than you want to.  Trying several times to drink less or to control your drinking.  Spending a lot of time getting alcohol, drinking, or recovering from drinking.  Craving alcohol.  Having problems at work, at school, or at home due to drinking.  Having problems in relationships due to drinking.  Drinking when it is dangerous to drink, such as before driving a car.  Continuing to drink even though you know you might have a physical or mental problem related to drinking.  Needing more and more alcohol to get the same effect you want from the alcohol (building up tolerance).  Having symptoms of withdrawal when you stop drinking. Symptoms of withdrawal include: ? Fatigue. ? Nightmares. ? Trouble sleeping. ? Depression. ? Anxiety. ? Fever. ? Seizures. ? Severe confusion. ? Feeling or seeing things that are not there (hallucinations). ? Tremors. ? Rapid heart rate. ? Rapid breathing. ? High blood pressure.  Drinking to avoid symptoms of withdrawal.  How is this diagnosed? This condition is diagnosed with an assessment. Your health care provider may start the assessment by asking three or four questions about your drinking. Your health care provider may perform a physical exam or do lab tests to see if you have physical problems resulting from alcohol use. She or he may refer you to a mental health professional for evaluation. How is this treated? Some people with alcohol use disorder are able to reduce their alcohol use to low-risk levels. Others need to completely quit drinking alcohol. When necessary, mental health professionals with specialized training in substance use treatment can help. Your health care provider can help you decide how severe your alcohol use disorder is and what type of treatment you need. The following forms of treatment are available:  Detoxification. Detoxification involves quitting  drinking and using prescription medicines within the first week to help lessen withdrawal symptoms. This treatment is important for people who have had withdrawal symptoms before and for heavy drinkers who are likely to have withdrawal symptoms. Alcohol withdrawal can be dangerous, and in severe cases, it can cause death. Detoxification may be provided in a home, community, or primary care setting, or in a hospital or substance use treatment facility.  Counseling. This treatment is also called talk therapy. It is provided by substance use treatment counselors. A counselor can address the reasons you use alcohol and suggest ways to keep you from drinking again or to prevent problem drinking. The goals of talk therapy are to: ? Find healthy activities and ways for you to cope with stress. ? Identify and avoid the things that trigger your alcohol use. ? Help you learn how to handle cravings.  Medicines.Medicines can help treat alcohol use disorder by: ? Decreasing alcohol cravings. ? Decreasing the positive feeling you have when you drink alcohol. ? Causing an uncomfortable physical reaction when you drink alcohol (aversion therapy).  Support groups. Support groups are led by people who have quit drinking. They provide emotional support, advice, and guidance.  These forms of treatment are often combined. Some people with this condition benefit from a combination of treatments provided by specialized substance use treatment centers. Follow these instructions at home:  Take over-the-counter and prescription medicines only as told by your health care provider.  Check with your health care provider before starting any new medicines.  Ask friends and family members not to offer you alcohol.  Avoid situations where alcohol is served, including gatherings where others are drinking alcohol.  Create a plan for what to do when you are tempted to use alcohol.  Find hobbies or activities that you enjoy  that do not include alcohol.  Keep all follow-up visits as told by your health care provider. This is important. How is this prevented?  If you drink, limit alcohol intake to no more than 1 drink a day for nonpregnant women and 2 drinks a day for men. One drink equals 12 oz of beer, 5 oz of wine, or 1 oz of hard liquor.  If you have a mental health condition, get treatment and support.  Do not give alcohol to adolescents.  If you are an adolescent: ? Do not drink alcohol. ? Do not be afraid to say no if someone offers you alcohol. Speak up about why you do not want to drink. You can be a positive role model for your friends and set a good example for those around you by not drinking alcohol. ? If your friends drink, spend time with others who do not drink alcohol. Make new friends who do not use alcohol. ? Find healthy ways to manage stress and emotions, such as meditation or deep breathing, exercise, spending time in nature, listening to music, or talking with a trusted friend or family member. Contact a health care provider if:  You are not able to take your medicines as told.  Your symptoms get worse.  You return to drinking alcohol (relapse) and your symptoms get worse. Get help right away if:  You have thoughts about hurting yourself or others. If you ever feel like you may hurt yourself or others, or have thoughts about taking your own life, get help right away. You can go to your nearest emergency department or call:  Your local emergency services (911 in the [place]  A suicide crisis helpline, such as the [place] at 7407079132. This is open 24 hours a day.  Summary  Alcohol use disorder is when your drinking disrupts your daily life. When you have this condition, you drink too much alcohol and you cannot control your drinking.  Treatment may include detoxification, counseling, medicine, and support groups.  Ask friends and family members  not to offer you alcohol. Avoid situations where alcohol is served.  Get help right away if you have thoughts about hurting yourself or others. This information is not intended to replace advice given to you by your health care provider. Make sure you discuss any questions you have with your health care provider. Document Released: 12/10/2004 Document Revised: 07/30/2016 Document Reviewed: 07/30/2016 Elsevier Interactive Patient Education  [employer].

## 2018-10-25 NOTE — Progress Notes (Signed)
Surgical Clinic History and Physical  Referring provider:  No referring provider defined for this encounter.  HISTORY OF PRESENT ILLNESS (HPI):  57 y.o. male presents for evaluation of Left > Right gynecomastia. Patient reports he first noticed his asymmetric breast enlargement 6 months ago, but says he thinks it's probably been present x "at least a year" if not longer. He says that he more recently became aware of it due to Left breast discomfort when he sleeps on his Left side. He describes a history of alcohol dependence with withdrawal symptoms when he attempts to stop consuming alcohol and says he wants to stop, but has not yet been able. He says he'd like to try a medication to help him stop, but that he sees a different physician/PA/NP each time he goes to walk-in clinic, doesn't have insurance, and expresses doubts regarding whether he could afford any such medication to help him with alcohol cessation. 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 He denies any palpable breast mass(es), pain, or nipple discharge and says his mother developed breast cancer between 12 - 47 years old.  PAST MEDICAL HISTORY (PMH):  Past Medical History:  Diagnosis Date  . Alcohol abuse   . Asthma   . GERD (gastroesophageal reflux disease)   . Gout   . Hypertension   . OCD (obsessive compulsive disorder)   . Renal colic     PAST SURGICAL HISTORY [facility]):  Past Surgical History:  Procedure Laterality Date  . CHOLECYSTECTOMY  2012    MEDICATIONS:  Prior to Admission medications   Medication Sig Start Date End Date Taking? Authorizing Provider  albuterol (VENTOLIN HFA) 108 (90 Base) MCG/ACT inhaler Inhale 2 puffs into the lungs every 4 (four) hours as needed. 09/01/18   Tukov-Yual, Arlyss Gandy, NP  allopurinol  (ZYLOPRIM) 300 MG tablet Take 1 tablet (300 mg total) by mouth daily. 09/01/18   Tukov-Yual, Arlyss Gandy, NP  Fluticasone-Salmeterol (ADVAIR DISKUS) 100-50 MCG/DOSE AEPB Inhale 1 puff into the lungs 2 (two) times daily. 09/01/18   Tukov-Yual, Arlyss Gandy, NP  folic acid (FOLVITE) 1 MG tablet Take 1 tablet (1 mg total) by mouth daily. 09/01/18   Tukov-Yual, Arlyss Gandy, NP  lisinopril (PRINIVIL,ZESTRIL) 20 MG tablet Take 0.5 tablets (10 mg total) by mouth daily. 10/04/18   Iloabachie, Chioma E, NP  Multiple Vitamins-Minerals (MULTIVITAMIN WITH MINERALS) tablet Take 1 tablet by mouth daily. 09/01/18   Tukov-Yual, Arlyss Gandy, NP  pantoprazole (PROTONIX) 40 MG tablet Take 1 tablet (40 mg total) by mouth daily. 09/01/18   Tukov-Yual, Arlyss Gandy, NP    ALLERGIES:  No Known Allergies   SOCIAL HISTORY:  Social History   Socioeconomic History  . Marital status: Married    Spouse name: Not on file  . Number of children: Not on file  . Years of education: Not on file  . Highest education level: Not on file  Occupational History  . Not on file  Social Needs  . Financial resource strain: Very hard  . Food insecurity:    Worry: Never true    Inability: Never true  . Transportation needs:    Medical: No    Non-medical: No  Tobacco Use  . Smoking status: Former Research scientist (life sciences)  . Smokeless tobacco: Never Used  . Tobacco comment: quit 30 years ago  Substance  and Sexual Activity  . Alcohol use: Not Currently    Alcohol/week: 7.0 standard drinks    Types: 7 Glasses of wine per week    Comment: per pt "i do sometimes about a glass of wine with the football game"  . Drug use: No  . Sexual activity: Yes  Lifestyle  . Physical activity:    Days per week: 2 days    Minutes per session: 10 min  . Stress: Very much  Relationships  . Social connections:    Talks on phone: Once a week    Gets together: Not on file    Attends religious service: Never    Active member of club or organization: No     Attends meetings of clubs or organizations: Never    Relationship status: Married  . Intimate partner violence:    Fear of current or ex partner: No    Emotionally abused: No    Physically abused: No    Forced sexual activity: No  Other Topics Concern  . Not on file  Social History Narrative   Lives at home with his wife and takes care of his wife. Independent at baseline      - Biggest strain is financial but doesn't think they could got get more help.      Patient expressed interest in possible financial assistance. Informed written consent obtained    The patient currently resides (home / rehab facility / nursing home): Home The patient normally is (ambulatory / bedbound): Ambulatory  FAMILY HISTORY:  Family History  Problem Relation Age of Onset  . Alcohol abuse Father   . Breast cancer Mother 38    Otherwise negative/non-contributory.  REVIEW OF SYSTEMS:  Constitutional: denies any other weight loss, fever, chills, or sweats  Eyes: denies any other vision changes, history of eye injury  ENT: denies sore throat, hearing problems  Respiratory: denies shortness of breath, wheezing  Cardiovascular: denies chest pain, palpitations Breasts: enlargement, mass(es), pain, nipple discharge Gastrointestinal: denies abdominal pain, N/V, or altered bowel function Musculoskeletal: denies any other joint pains or cramps  Skin: Denies any other rashes or skin discolorations Neurological: denies any other headache, dizziness, weakness  Psychiatric: Denies any other depression, anxiety   All other review of systems were otherwise negative   VITAL SIGNS:  BP (!) 168/115   Pulse (!) 110   Temp 97.9 F (36.6 C) (Temporal)   Resp (!) 22   Ht 6' (1.829 m)   Wt 188 lb 6.4 oz (85.5 kg)   SpO2 98%   BMI 25.55 kg/m    PHYSICAL EXAM:  Constitutional:  -- Normal body habitus  -- Awake, alert, and oriented x3  Eyes:  -- Pupils equally round and reactive to light  -- No scleral  icterus  Ear, nose, throat:  -- No jugular venous distension -- No nasal drainage, bleeding Pulmonary:  -- No crackles  -- Equal breath sounds bilaterally -- Breathing non-labored at rest Cardiovascular:  -- S1, S2 present  -- No pericardial rubs  Breasts: -- Left minimally tender to palpation >> Right gynecomastia -- No appreciable mass(es), nipple discharge, erythema, or axillary lymphadenopathy Gastrointestinal:  -- Abdomen soft, nontender, non-distended, no guarding/rebound  -- No abdominal masses appreciated, pulsatile or otherwise  Musculoskeletal and Integumentary:  -- Wounds or skin discoloration: None appreciated -- Extremities: B/L UE and LE FROM, hands and feet warm, no edema  Neurologic:  -- Motor function: Intact and symmetric -- Sensation: Intact and symmetric  Labs:  CBC Latest Ref Rng & Units 10/21/2018 10/06/2018 09/30/2018  WBC 4.0 - 10.5 K/uL 6.8 5.0 7.4  Hemoglobin 13.0 - 17.0 g/dL 12.5(L) 11.3(L) 12.4(L)  Hematocrit 39.0 - 52.0 % 38.7(L) 35.8(L) 39.1  Platelets 150 - 400 K/uL 265 190 291   CMP Latest Ref Rng & Units 10/21/2018 10/06/2018 09/30/2018  Glucose 70 - 99 mg/dL 128(H) 111(H) 117(H)  BUN 6 - 20 mg/dL 22(H) 18 13  Creatinine 0.61 - 1.24 mg/dL 1.27(H) 1.11 1.32(H)  Sodium 135 - 145 mmol/L 142 143 137  Potassium 3.5 - 5.1 mmol/L 3.8 4.2 3.9  Chloride 98 - 111 mmol/L 104 113(H) 102  CO2 22 - 32 mmol/L 22 20(L) 20(L)  Calcium 8.9 - 10.3 mg/dL 8.5(L) 8.8(L) 9.4  Total Protein 6.5 - 8.1 g/dL 7.0 6.7 -  Total Bilirubin 0.3 - 1.2 mg/dL 1.0 0.3 -  Alkaline Phos 38 - 126 U/L 65 59 -  AST 15 - 41 U/L 23 11(L) -  ALT 0 - 44 U/L 15 9 -   Imaging studies:  Bilateral Diagnostic Mammogram with Focal Ultrasound (10/03/2018) ACR Breast Density Category a: The breast tissue is almost entirely fatty. Mammographically, there are no suspicious masses, areas of architectural distortion or microcalcifications in either breast. There is bilateral gynecomastia,  more prominent in the left subareolar breast. BI-RADS CATEGORY  2: Benign.  Assessment/Plan:  57 y.o. male with Left >> Right gynecomastia, complicated by co-morbidities including HTN, asthma, chronic alcohol dependence and former tobacco abuse (smoking), GERD, gout, and obsessive compulsive disorder.   - results of recent breast imaging studies discussed  - impact of dietary changes and cessation of alcohol discussed, medications reviewed  - patient states he is not currently interested in plastic surgery referral at this time  - return to clinic as needed, instructed to call if any questions or concerns  All of the above recommendations were discussed with the patient, and all of patient's questions were answered to his expressed satisfaction.  Thank you for the opportunity to participate in this patient's care.  -- Marilynne Drivers Rosana Hoes, MD, [place]: [facility] - Partnering for exceptional care. Office: [phone]

## 2018-10-26 ENCOUNTER — Encounter: Payer: Self-pay | Admitting: Surgery

## 2018-11-08 ENCOUNTER — Emergency Department
Admission: EM | Admit: 2018-11-08 | Discharge: 2018-11-09 | Disposition: A | Discharge status: Home / Self Care | Payer: Medicaid Other | Attending: Emergency Medicine | Admitting: Emergency Medicine

## 2018-11-08 ENCOUNTER — Encounter: Payer: Self-pay | Admitting: Emergency Medicine

## 2018-11-08 ENCOUNTER — Other Ambulatory Visit: Payer: Self-pay

## 2018-11-08 DIAGNOSIS — Z79899 Other long term (current) drug therapy: Secondary | ICD-10-CM | POA: Insufficient documentation

## 2018-11-08 DIAGNOSIS — X789XXA Intentional self-harm by unspecified sharp object, initial encounter: Secondary | ICD-10-CM | POA: Insufficient documentation

## 2018-11-08 DIAGNOSIS — R45851 Suicidal ideations: Secondary | ICD-10-CM | POA: Insufficient documentation

## 2018-11-08 DIAGNOSIS — Y998 Other external cause status: Secondary | ICD-10-CM | POA: Insufficient documentation

## 2018-11-08 DIAGNOSIS — I10 Essential (primary) hypertension: Secondary | ICD-10-CM | POA: Insufficient documentation

## 2018-11-08 DIAGNOSIS — F10229 Alcohol dependence with intoxication, unspecified: Secondary | ICD-10-CM | POA: Insufficient documentation

## 2018-11-08 DIAGNOSIS — F10929 Alcohol use, unspecified with intoxication, unspecified: Secondary | ICD-10-CM

## 2018-11-08 DIAGNOSIS — Y929 Unspecified place or not applicable: Secondary | ICD-10-CM | POA: Insufficient documentation

## 2018-11-08 DIAGNOSIS — F329 Major depressive disorder, single episode, unspecified: Secondary | ICD-10-CM | POA: Insufficient documentation

## 2018-11-08 DIAGNOSIS — S50812A Abrasion of left forearm, initial encounter: Secondary | ICD-10-CM

## 2018-11-08 DIAGNOSIS — J45909 Unspecified asthma, uncomplicated: Secondary | ICD-10-CM | POA: Insufficient documentation

## 2018-11-08 DIAGNOSIS — Z87891 Personal history of nicotine dependence: Secondary | ICD-10-CM | POA: Insufficient documentation

## 2018-11-08 DIAGNOSIS — Y939 Activity, unspecified: Secondary | ICD-10-CM | POA: Insufficient documentation

## 2018-11-08 LAB — COMPREHENSIVE METABOLIC PANEL
ALT: 10 U/L (ref 0–44)
AST: 13 U/L — ABNORMAL LOW (ref 15–41)
Albumin: 4.5 g/dL (ref 3.5–5.0)
Alkaline Phosphatase: 63 U/L (ref 38–126)
Anion gap: 13 (ref 5–15)
BUN: 12 mg/dL (ref 6–20)
CO2: 20 mmol/L — ABNORMAL LOW (ref 22–32)
Calcium: 9.2 mg/dL (ref 8.9–10.3)
Chloride: 106 mmol/L (ref 98–111)
Creatinine, Ser: 1.15 mg/dL (ref 0.61–1.24)
GFR calc Af Amer: >60 mL/min (ref >60)
GFR calc non Af Amer: >60 mL/min (ref >60)
Glucose, Bld: 126 mg/dL — ABNORMAL HIGH (ref 70–99)
Potassium: 3.5 mmol/L (ref 3.5–5.1)
Sodium: 139 mmol/L (ref 135–145)
Total Bilirubin: 0.6 mg/dL (ref 0.3–1.2)
Total Protein: 7.5 g/dL (ref 6.5–8.1)

## 2018-11-08 LAB — CBC
HCT: 42.7 % (ref 39.0–52.0)
Hemoglobin: 13.1 g/dL (ref 13.0–17.0)
MCH: 25.6 pg — ABNORMAL LOW (ref 26.0–34.0)
MCHC: 30.7 g/dL (ref 30.0–36.0)
MCV: 83.4 fL (ref 80.0–100.0)
Platelets: 263 10*3/uL (ref 150–400)
RBC: 5.12 MIL/uL (ref 4.22–5.81)
RDW: 19.1 % — ABNORMAL HIGH (ref 11.5–15.5)
WBC: 8.6 10*3/uL (ref 4.0–10.5)
nRBC: 0 % (ref 0.0–0.2)

## 2018-11-08 LAB — ACETAMINOPHEN LEVEL: Acetaminophen (Tylenol), Serum: <10 ug/mL — ABNORMAL LOW (ref 10–30)

## 2018-11-08 LAB — SALICYLATE LEVEL: Salicylate Lvl: <7 mg/dL (ref 2.8–30.0)

## 2018-11-08 LAB — ETHANOL: Alcohol, Ethyl (B): 327 mg/dL (ref <10)

## 2018-11-08 MED ORDER — DIPHENHYDRAMINE HCL 50 MG/ML IJ SOLN
INTRAMUSCULAR | Status: AC
  Administered 2018-11-08: 19:00:00
  Filled 2018-11-08: qty 1

## 2018-11-08 MED ORDER — HALOPERIDOL LACTATE 5 MG/ML IJ SOLN
INTRAMUSCULAR | Status: AC
  Administered 2018-11-08: 19:00:00
  Filled 2018-11-08: qty 1

## 2018-11-08 NOTE — ED Triage Notes (Addendum)
Pt to ED with BPD under IVC, pt has self inflicted superficial lac to lft wrist. States SI.

## 2018-11-08 NOTE — ED Notes (Addendum)
PT refusing to dress out at this time

## 2018-11-08 NOTE — ED Provider Notes (Signed)
[facility] Emergency Department Provider Note  ____________________________________________  Time seen: Approximately 11:36 PM  I have reviewed the triage vital signs and the nursing notes.   HISTORY  Chief Complaint Altered Mental Status (SI) and Laceration (Left wrist)  Level 5 Caveat: Portions of the History and Physical including HPI and review of systems are unable to be completely obtained due to patient being a poor historian due to intoxication   HPI Miguel [name] is a 57 y.o. male with a history of alcohol abuse, hypertension who comes the ED today feeling very depressed.   He is worried about his wife because she has been more sick and had a few extra falls recently.  Holidays are always a tough time for him.  He did some cutting to his left wrist today while feeling suicidal.  He denies any intent to kill himself.  No HI or hallucinations.  No fevers chills vomiting bloody stool or belly pain.  No chest pain or shortness of breath.  Feels like he probably had a tetanus shot in the last year.  Review of electronic medical record shows last tetanus vaccination was October 2018.   Past Medical History:  Diagnosis Date  . Alcohol abuse   . Asthma   . GERD (gastroesophageal reflux disease)   . Gout   . Hypertension   . OCD (obsessive compulsive disorder)   . Renal colic      Patient Active Problem List   Diagnosis Date Noted  . Breast lump or mass 10/04/2018  . Sepsis secondary to UTI ([place]) 09/14/2018  . Asthma 07/07/2018  . GERD (gastroesophageal reflux disease) 07/07/2018  . OCD (obsessive compulsive disorder) 07/07/2018  . Self-inflicted laceration of wrist 03/10/2018  . Leg hematoma 12/25/2017  . Suicide and self-inflicted injury by cutting and piercing instrument ([place]) 03/10/2017  . Severe recurrent major 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 ([place]) 03/09/2017  . Substance induced mood disorder ([place]) 08/15/2016  . Involuntary commitment  08/15/2016  . Alcohol use disorder, severe, dependence ([place]) 02/05/2016  . Hypertension 12/05/2015  . Tachycardia 12/05/2015  . Gout 11/13/2015  . Chronic back pain 05/02/2015     Past Surgical History:  Procedure Laterality Date  . CHOLECYSTECTOMY  2012     Prior to Admission medications   Medication Sig Start Date End Date Taking? Authorizing Provider  albuterol (VENTOLIN HFA) 108 (90 Base) MCG/ACT inhaler Inhale 2 puffs into the lungs every 4 (four) hours as needed. 09/01/18   Tukov-Yual, Arlyss Gandy, NP  allopurinol (ZYLOPRIM) 300 MG tablet Take 1 tablet (300 mg total) by mouth daily. 09/01/18   Tukov-Yual, Arlyss Gandy, NP  Fluticasone-Salmeterol (ADVAIR DISKUS) 100-50 MCG/DOSE AEPB Inhale 1 puff into the lungs 2 (two) times daily. 09/01/18   Tukov-Yual, Arlyss Gandy, NP  folic acid (FOLVITE) 1 MG tablet Take 1 tablet (1 mg total) by mouth daily. 09/01/18   Tukov-Yual, Arlyss Gandy, NP  lisinopril (PRINIVIL,ZESTRIL) 20 MG tablet Take 0.5 tablets (10 mg total) by mouth daily. 10/04/18   Iloabachie, Chioma E, NP  Multiple Vitamins-Minerals (MULTIVITAMIN WITH MINERALS) tablet Take 1 tablet by mouth daily. 09/01/18   Tukov-Yual, Arlyss Gandy, NP  pantoprazole (PROTONIX) 40 MG tablet Take 1 tablet (40 mg total) by mouth daily. 09/01/18   Tukov-Yual, Arlyss Gandy, NP     Allergies Patient has no known allergies.   Family History  Problem Relation Age of Onset  . Alcohol abuse Father   . Breast cancer Mother 63    Social History  Social History   Tobacco Use  . Smoking status: Former Research scientist (life sciences)  . Smokeless tobacco: Never Used  . Tobacco comment: quit 30 years ago  Substance Use Topics  . Alcohol use: Not Currently    Alcohol/week: 7.0 standard drinks    Types: 7 Glasses of wine per week    Comment: per pt "i do sometimes about a glass of wine with the football game"  . Drug use: No    Review of Systems  Constitutional:   No fever or chills.  ENT:   No sore throat. No  rhinorrhea. Cardiovascular:   No chest pain or syncope. Respiratory:   No dyspnea or cough. Gastrointestinal:   Negative for abdominal pain, vomiting and diarrhea.  Musculoskeletal:   Negative for focal pain or swelling All other systems reviewed and are negative except as documented above in ROS and HPI.  ____________________________________________   PHYSICAL EXAM:  VITAL SIGNS: ED Triage Vitals  Enc Vitals Group     BP 11/08/18 1740 (!) 164/91     Pulse Rate 11/08/18 1740 (!) 115     Resp 11/08/18 1740 16     Temp 11/08/18 1740 97.9 F (36.6 C)     Temp Source 11/08/18 1740 Oral     SpO2 11/08/18 1740 99 %     Weight --      Height --      Head Circumference --      Peak Flow --      Pain Score 11/08/18 1741 0     Pain Loc --      Pain Edu? --      Excl. in [place]? --     Vital signs reviewed, nursing assessments reviewed.   Constitutional:   Alert and oriented. Non-toxic appearance. Eyes:   Conjunctivae are normal. EOMI. PERRL. ENT      Head:   Normocephalic and atraumatic.      Nose:   No congestion/rhinnorhea.       Mouth/Throat:   MMM, no pharyngeal erythema. No peritonsillar mass.  Alcohol on breath      Neck:   No meningismus. Full ROM. Hematological/Lymphatic/Immunilogical:   No cervical lymphadenopathy. Cardiovascular:   RRR. Symmetric bilateral radial and DP pulses.  No murmurs. Cap refill less than 2 seconds. Respiratory:   Normal respiratory effort without tachypnea/retractions. Breath sounds are clear and equal bilaterally. No wheezes/rales/rhonchi. Gastrointestinal:   Soft and nontender. Non distended. There is no CVA tenderness.  No rebound, rigidity, or guarding.  Musculoskeletal:   Normal range of motion in all extremities. No joint effusions.  No lower extremity tenderness.  No edema. Neurologic:   Slurred speech.  Tearful. Motor grossly intact. No acute focal neurologic deficits are appreciated.  Skin:    Skin is warm, dry with multiple linear  superficial abrasions over the distal left forearm on the volar aspect.  No lacerations.  No inflammatory or infectious changes. ____________________________________________    LABS (pertinent positives/negatives) (all labs ordered are listed, but only abnormal results are displayed) Labs Reviewed  COMPREHENSIVE METABOLIC PANEL - Abnormal; Notable for the following components:      Result Value   CO2 20 (*)    Glucose, Bld 126 (*)    AST 13 (*)    All other components within normal limits  ETHANOL - Abnormal; Notable for the following components:   Alcohol, Ethyl (B) 327 (*)    All other components within normal limits  ACETAMINOPHEN LEVEL - Abnormal; Notable for the following components:  Acetaminophen (Tylenol), Serum <10 (*)    All other components within normal limits  CBC - Abnormal; Notable for the following components:   MCH 25.6 (*)    RDW 19.1 (*)    All other components within normal limits  SALICYLATE LEVEL  URINE DRUG SCREEN, QUALITATIVE ([facility] ONLY)   ____________________________________________   EKG    ____________________________________________    RADIOLOGY  No results found.  ____________________________________________   PROCEDURES Procedures  ____________________________________________    CLINICAL IMPRESSION / ASSESSMENT AND PLAN / ED COURSE  Pertinent labs & imaging results that were available during my care of the patient were reviewed by me and considered in my medical decision making (see chart for details).    Patient presents with alcohol intoxication which is a recurrent issue for him, he is well-known to this ED.  He is under involuntary commitment which I will continue for now.  In the past he has been diagnosed with alcohol induced mood disorder.  We will continue to observe overnight until clinically sober at which point he can be reassessed for psychiatric disposition.  Tetanus is up-to-date, no specific wound care needed for  the abrasions.  Labs are unremarkable.      ____________________________________________   FINAL CLINICAL IMPRESSION(S) / ED DIAGNOSES    Final diagnoses:  Alcoholic intoxication with complication ([place])  Abrasion of left forearm, initial encounter  Suicidal ideation     ED Discharge Orders    None      Portions of this note were generated with dragon dictation software. Dictation errors may occur despite best attempts at proofreading.   Carrie Mew, MD 11/08/18 [phone]

## 2018-11-09 LAB — URINE DRUG SCREEN, QUALITATIVE (ARMC ONLY)
Amphetamines, Ur Screen: NOT DETECTED
Barbiturates, Ur Screen: NOT DETECTED
Benzodiazepine, Ur Scrn: NOT DETECTED
Cannabinoid 50 Ng, Ur ~~LOC~~: POSITIVE — AB
Cocaine Metabolite,Ur ~~LOC~~: NOT DETECTED
MDMA (Ecstasy)Ur Screen: NOT DETECTED
Methadone Scn, Ur: NOT DETECTED
Opiate, Ur Screen: NOT DETECTED
Phencyclidine (PCP) Ur S: NOT DETECTED
Tricyclic, Ur Screen: NOT DETECTED

## 2018-11-09 NOTE — ED Notes (Signed)
BEHAVIORAL HEALTH ROUNDING Patient sleeping: No. Patient alert and oriented: yes Behavior appropriate: Yes.  ; If no, describe:  Nutrition and fluids offered: yes Toileting and hygiene offered: Yes  Sitter present: q15 minute observations and security  monitoring Law enforcement present: Yes  ODS  

## 2018-11-09 NOTE — ED Notes (Signed)
ED Is the patient under IVC or is there intent for IVC: Yes.   Is the patient medically cleared: Yes.   Is there vacancy in the ED BHU: Yes.   Is the population mix appropriate for patient: Yes.   Is the patient awaiting placement in inpatient or outpatient setting:  Has the patient had a psychiatric consult: [place] pending Survey of unit performed for contraband, proper placement and condition of furniture, tampering with fixtures in bathroom, shower, and each patient room: Yes.  ; Findings:  APPEARANCE/BEHAVIOR Calm and cooperative NEURO ASSESSMENT Orientation: oriented x3  Denies pain Hallucinations: No.None noted (Hallucinations)  denies Speech: Normal Gait: normal RESPIRATORY ASSESSMENT Even  Unlabored respirations  CARDIOVASCULAR ASSESSMENT Pulses equal   regular rate  Skin warm and dry   GASTROINTESTINAL ASSESSMENT no GI complaint EXTREMITIES Full ROM  PLAN OF CARE Provide calm/safe environment. Vital signs assessed twice daily. ED BHU Assessment once each 12-hour shift. Collaborate with TTS daily or as condition indicates. Assure the ED provider has rounded once each shift. Provide and encourage hygiene. Provide redirection as needed. Assess for escalating behavior; address immediately and inform ED provider.  Assess family dynamic and appropriateness for visitation as needed: Yes.  ; If necessary, describe findings:  Educate the patient/family about BHU procedures/visitation: Yes.  ; If necessary, describe findings:

## 2018-11-09 NOTE — ED Notes (Signed)
Report given to [facility] MD  Evaluation to begin shortly

## 2018-11-09 NOTE — ED Notes (Signed)
Report received from [place] Patient sleeping: Yes.   Patient alert and oriented: eyes closed  Appears to be asleep Behavior appropriate: Yes.  ; If no, describe:  Nutrition and fluids offered: Yes  Toileting and hygiene offered: sleeping Sitter present: q 15 minute observations and security monitoring Law enforcement present: yes  ODS  ENVIRONMENTAL ASSESSMENT Potentially harmful objects out of patient reach: Yes.   Personal belongings secured: Yes.   Patient dressed in hospital provided attire only: Yes.   Plastic bags out of patient reach: Yes.   Patient care equipment (cords, cables, call bells, lines, and drains) shortened, removed, or accounted for: Yes.   Equipment and supplies removed from bottom of stretcher: Yes.   Potentially toxic materials out of patient reach: Yes.   Sharps container removed or out of patient reach: Yes.

## 2018-11-09 NOTE — ED Notes (Signed)
Pt can be heard talking to [facility] MD on computer  Continue to monitor

## 2018-11-09 NOTE — ED Notes (Signed)
Rounding on pt. Introduced self to patient  At this time he is awake asking to check on his wife/ Informed Marinda Elk RN of concerns

## 2018-11-09 NOTE — ED Provider Notes (Signed)
-----------------------------------------   6:57 AM on 11/09/2018 -----------------------------------------   Blood pressure 132/84, pulse 84, temperature 97.8 F (36.6 C), temperature source Oral, resp. rate 15, SpO2 99 %.  The patient had no acute events since last update.  Calm and cooperative at this time.  Patient awoke and is able to participate in TTS and [facility] psychiatry evaluations now.  Disposition is pending Psychiatry/Behavioral Medicine team recommendations.     Paulette Blanch, MD 11/09/18 [phone]

## 2018-11-09 NOTE — Discharge Instructions (Addendum)
You have been seen in the Emergency Department (ED) today for a psychiatric complaint.  You have been evaluated by psychiatry and we believe you are safe to be discharged from the hospital.    No driving today or while using alcohol.  Please return to the ED immediately if you have ANY thoughts of hurting yourself or anyone else, so that we may help you.  Please avoid alcohol and drug use.  Follow up with your doctor and/or therapist as soon as possible regarding today's ED visit.   Please follow up any other recommendations and clinic appointments provided by the psychiatry team that saw you in the Emergency Department.

## 2018-11-09 NOTE — BH Assessment (Signed)
Assessment Note  Miguel [name] is an 57 y.o. male. Patients presents to [facility]-ED due to suicidal ideation. Patient states he cut his left wrist due to feeling stressed about not being able to get his wife a Christmas gift and his wife's medication conditions. Patient states " I had two shots and just had a moment." Patient endorses financial stress and feeling overwhelmed due to being his wife's medical conditions. Patient currently denies SI/HI/AVH.  Patient denies having outpatient mental health providers.  Patient endorses ETOH use and denies other illicit drug use.  Patient doesn't currently have any involvement in the legal system.  Patient presents cooperative, hyperverbal with a anxious affect during assessment. Patient is oriented x 4.    Diagnosis: Depression  Past Medical History:  Past Medical History:  Diagnosis Date  . Alcohol abuse   . Asthma   . GERD (gastroesophageal reflux disease)   . Gout   . Hypertension   . OCD (obsessive compulsive disorder)   . Renal colic     Past Surgical History:  Procedure Laterality Date  . CHOLECYSTECTOMY  2012    Family History:  Family History  Problem Relation Age of Onset  . Alcohol abuse Father   . Breast cancer Mother 22    Social History:  reports that he has quit smoking. He has never used smokeless tobacco. He reports previous alcohol use of about 7.0 standard drinks of alcohol per week. He reports that he does not use drugs.  Additional Social History:  Alcohol / Drug Use Pain Medications: SEE PTA  Prescriptions: SEE PTA  Over the Counter: SEE PTA  History of alcohol / drug use?: Yes Longest period of sobriety (when/how long): Unknown Negative Consequences of Use: Financial, Personal relationships, Work / School Withdrawal Symptoms: Fever / Chills, Irritability, Tingling Substance #1 Name of Substance 1: ETOH  1 - Age of First Use: Unknown 1 - Amount (size/oz): Unknown 1 - Frequency: Unknown 1 - Duration:  Unknown 1 - Last Use / Amount: Unknown  CIWA: CIWA-Ar BP: (!) 101/55 Pulse Rate: 71 COWS:    Allergies: No Known Allergies  Home Medications: (Not in a hospital admission)   OB/GYN Status:  No LMP for male patient.  General Assessment Data Assessment unable to be completed: (Assessment completed ) Location of Assessment: [facility] ED TTS Assessment: In system Is this a Tele or Face-to-Face Assessment?: Face-to-Face Is this an Initial Assessment or a Re-assessment for this encounter?: Initial Assessment Patient Accompanied by:: N/A Language Other than English: No Living Arrangements: Other (Comment)(Lives with wife ) What gender do you identify as?: Male Marital status: Married [place] name: N/A Pregnancy Status: No Living Arrangements: Spouse/significant other Can pt return to current living arrangement?: Yes Admission Status: Involuntary Petitioner: ED Attending Is patient capable of signing voluntary admission?: No Referral Source: Self/Family/Friend Insurance type: 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 : No Suicidal Intent: No Has patient had any suicidal intent within the past 6 months prior to admission? : No Is patient at risk for suicide?:  Yes Suicidal Plan?: Yes-Currently Present Has patient had any suicidal plan within the past 6 months prior to admission? : No Specify Current Suicidal Plan: cut wrist  Access to Means: Yes Specify  Access to Suicidal Means: kitchen knife  What has been your use of drugs/alcohol within the last 12 months?: ETOH  Previous Attempts/Gestures: No How many times?: 0 Other Self Harm Risks: cutting  Triggers for Past Attempts: Unpredictable Intentional Self Injurious Behavior: Cutting Comment - Self Injurious Behavior: cutting wrist  Family Suicide History: No Recent stressful life event(s): Financial Problems, Other (Comment)(wife's illnes ) Persecutory voices/beliefs?: No Depression Symptoms: Feeling angry/irritable Substance abuse history and/or treatment for substance abuse?: Yes Suicide prevention information given to non-admitted patients: Not applicable  Risk to Others within the past 6 months Homicidal Ideation: No Does patient have any lifetime risk of violence toward others beyond the six months prior to admission? : No Thoughts of Harm to Others: No Current Homicidal Intent: No Current Homicidal Plan: No Access to Homicidal Means: No Identified Victim: N/A History of harm to others?: No Assessment of Violence: None Noted Violent Behavior Description: None reported  Does patient have access to weapons?: Yes (Comment)(kitchen knives ) Criminal Charges Pending?: No Does patient have a court date: No Is patient on probation?: No  Psychosis Hallucinations: None noted Delusions: None noted  Mental Status Report Appearance/Hygiene: In scrubs Eye Contact: Fair Motor Activity: Unsteady Speech: Pressured Level of Consciousness: Alert Mood: Anxious Affect: Anxious Anxiety Level: Moderate Thought Processes: Circumstantial Judgement: Impaired Orientation: Person, Place, Time, Situation, Appropriate for developmental age Obsessive Compulsive Thoughts/Behaviors: None  Cognitive Functioning Concentration: Good Memory: Recent Intact, Remote Intact Is patient IDD: No Insight: Fair Impulse Control: Poor Appetite: Good Have you had any weight changes? : No Change Sleep:  No Change Total Hours of Sleep: 8 Vegetative Symptoms: None  ADLScreening [facility] Assessment Services) Patient's cognitive ability adequate to safely complete daily activities?: Yes Patient able to express need for assistance with ADLs?: Yes Independently performs ADLs?: Yes (appropriate for developmental age)  Prior Inpatient Therapy Prior Inpatient Therapy: Yes Prior Therapy Dates: 03/2018 Prior Therapy Facilty/Provider(s): [facility]  Reason for Treatment: Depression  Prior Outpatient Therapy Prior Outpatient Therapy: No Does patient have an ACCT team?: No Does patient have Intensive In-House Services?  : No Does patient have [facility] services? : No Does patient have P4CC services?: No  ADL Screening (condition at time of admission) Patient's cognitive ability adequate to safely complete daily activities?: Yes Is the patient deaf or have difficulty hearing?: No Does the patient have difficulty seeing, even when wearing glasses/contacts?: No Does the patient have difficulty concentrating, remembering, or making decisions?: No Patient able to express need for assistance with ADLs?: Yes Does the patient have difficulty dressing or bathing?: No Independently performs ADLs?: Yes (appropriate for developmental age) Does the patient have difficulty walking or climbing stairs?: No Weakness of Legs: None Weakness of Arms/Hands: None  Home Assistive Devices/Equipment Home Assistive Devices/Equipment: None  Therapy Consults (therapy consults require a physician order) PT Evaluation Needed: No OT Evalulation Needed: No SLP Evaluation Needed: No Abuse/Neglect Assessment (Assessment to be complete while patient is alone) Abuse/Neglect Assessment Can Be Completed: Yes Physical Abuse: Denies Verbal Abuse: Denies Sexual Abuse: Denies Exploitation of patient/patient's resources: Denies Self-Neglect: Denies Values / Beliefs Cultural Requests During Hospitalization: None Spiritual Requests  During Hospitalization: None Consults Spiritual Care Consult Needed: No Social Work Consult Needed: No Regulatory affairs officer (For Healthcare) Does Patient Have a Medical Advance Directive?: No  Disposition:  Disposition Initial Assessment Completed for this Encounter: Yes Patient referred to: Other (Comment)(pending psych consult )  On Site Evaluation by:   Reviewed with Physician:    Jodie Echevaria, [place], [place] 11/09/2018 9:34 AM

## 2018-11-09 NOTE — ED Provider Notes (Signed)
Vitals:   11/09/18 0658 11/09/18 1010  BP: (!) 101/55 111/62  Pulse: 71 71  Resp:  16  Temp: 98 F (36.7 C) 98 F (36.7 C)  SpO2: 99% 99%    Patient resting comfortably.  Currently sitting up eating a sandwich.  Is very pleasant.  He tells me that he had a verbal argument with his wife that led him to drink last night.  Urgency is not induced again.  He plans to get a ride home.  Denies he wants to hurt himself or anyone else.  He is apologetic for the whole situation last night.  No evidence of acute intoxication.  Speaking in full clear sentences.  Has been ambulating well without difficulty.  Is appropriate for discharge.  Psychiatry is released the patient from his involuntary commitment.   Delman Kitten, MD 11/09/18 1200

## 2018-11-13 ENCOUNTER — Emergency Department
Admission: EM | Admit: 2018-11-13 | Discharge: 2018-11-13 | Disposition: A | Discharge status: Home / Self Care | Payer: Medicaid Other | Attending: Emergency Medicine | Admitting: Emergency Medicine

## 2018-11-13 ENCOUNTER — Encounter: Payer: Self-pay | Admitting: Emergency Medicine

## 2018-11-13 DIAGNOSIS — Z87891 Personal history of nicotine dependence: Secondary | ICD-10-CM | POA: Insufficient documentation

## 2018-11-13 DIAGNOSIS — I10 Essential (primary) hypertension: Secondary | ICD-10-CM | POA: Insufficient documentation

## 2018-11-13 DIAGNOSIS — Z79899 Other long term (current) drug therapy: Secondary | ICD-10-CM | POA: Insufficient documentation

## 2018-11-13 DIAGNOSIS — J45909 Unspecified asthma, uncomplicated: Secondary | ICD-10-CM | POA: Insufficient documentation

## 2018-11-13 DIAGNOSIS — K529 Noninfective gastroenteritis and colitis, unspecified: Secondary | ICD-10-CM

## 2018-11-13 LAB — CBC
HCT: 41.6 % (ref 39.0–52.0)
Hemoglobin: 13 g/dL (ref 13.0–17.0)
MCH: 25.5 pg — ABNORMAL LOW (ref 26.0–34.0)
MCHC: 31.3 g/dL (ref 30.0–36.0)
MCV: 81.6 fL (ref 80.0–100.0)
Platelets: 245 10*3/uL (ref 150–400)
RBC: 5.1 MIL/uL (ref 4.22–5.81)
RDW: 19.2 % — ABNORMAL HIGH (ref 11.5–15.5)
WBC: 12.7 10*3/uL — ABNORMAL HIGH (ref 4.0–10.5)
nRBC: 0 % (ref 0.0–0.2)

## 2018-11-13 LAB — URINALYSIS, COMPLETE (UACMP) WITH MICROSCOPIC
Bilirubin Urine: NEGATIVE
Glucose, UA: NEGATIVE mg/dL
Ketones, ur: 80 mg/dL — AB
Leukocytes, UA: NEGATIVE
Nitrite: NEGATIVE
Protein, ur: 30 mg/dL — AB
Specific Gravity, Urine: 1.018 (ref 1.005–1.030)
pH: 5 (ref 5.0–8.0)

## 2018-11-13 LAB — COMPREHENSIVE METABOLIC PANEL
ALT: 21 U/L (ref 0–44)
AST: 24 U/L (ref 15–41)
Albumin: 4.9 g/dL (ref 3.5–5.0)
Alkaline Phosphatase: 87 U/L (ref 38–126)
Anion gap: 22 — ABNORMAL HIGH (ref 5–15)
BUN: 20 mg/dL (ref 6–20)
CO2: 18 mmol/L — ABNORMAL LOW (ref 22–32)
Calcium: 8.8 mg/dL — ABNORMAL LOW (ref 8.9–10.3)
Chloride: 98 mmol/L (ref 98–111)
Creatinine, Ser: 1.37 mg/dL — ABNORMAL HIGH (ref 0.61–1.24)
GFR calc Af Amer: >60 mL/min (ref >60)
GFR calc non Af Amer: 56 mL/min — ABNORMAL LOW (ref >60)
Glucose, Bld: 158 mg/dL — ABNORMAL HIGH (ref 70–99)
Potassium: 3.9 mmol/L (ref 3.5–5.1)
Sodium: 138 mmol/L (ref 135–145)
Total Bilirubin: 3.3 mg/dL — ABNORMAL HIGH (ref 0.3–1.2)
Total Protein: 8.1 g/dL (ref 6.5–8.1)

## 2018-11-13 LAB — LIPASE, BLOOD: Lipase: 23 U/L (ref 11–51)

## 2018-11-13 IMAGING — CT CT CERVICAL SPINE W/O CM
3 of 8 series · 9 of 33 positions shown, 10 images · non-contrast
Comparison: Head CT dated 09/13/2017

CLINICAL DATA: 56-year-old male with head trauma.

EXAM:
CT HEAD WITHOUT CONTRAST
CT CERVICAL SPINE WITHOUT CONTRAST
TECHNIQUE: Multidetector CT imaging of the head and cervical spine was
performed following the standard protocol without intravenous
contrast. Multiplanar CT image reconstructions of the cervical spine
were also generated.

[Series 8: sagittal bone · sagittal · 0.22mm/px · 5 of 82 slices shown]
[im 12/82  bone]
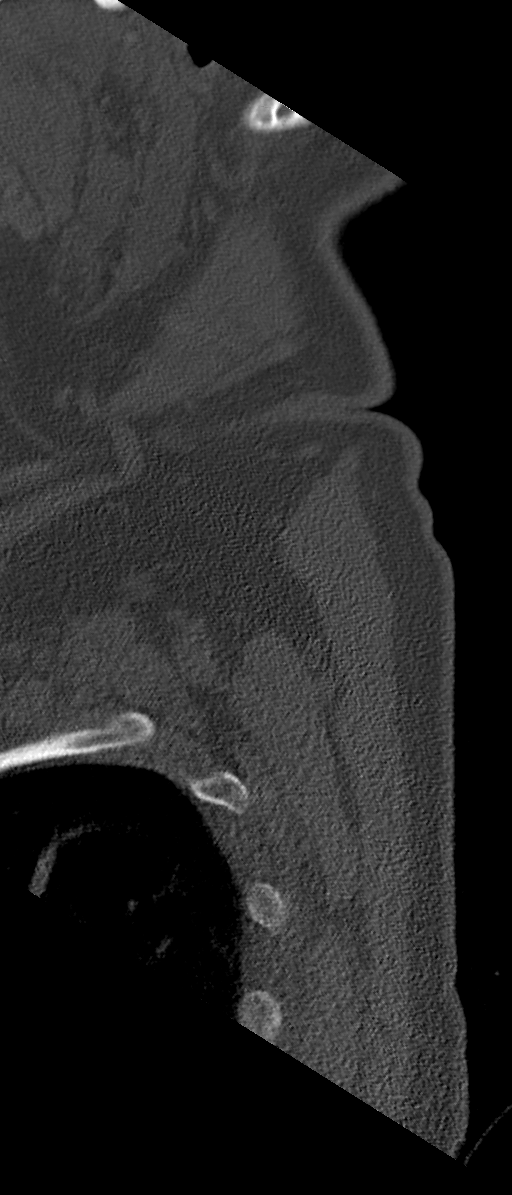
[im 24/82  bone]
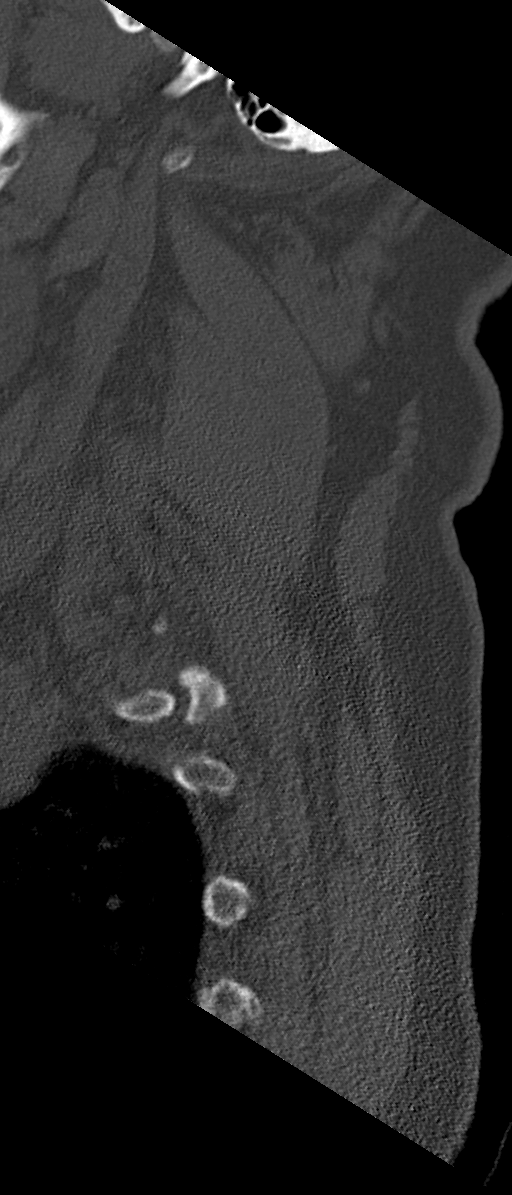
[im 35/82  bone]
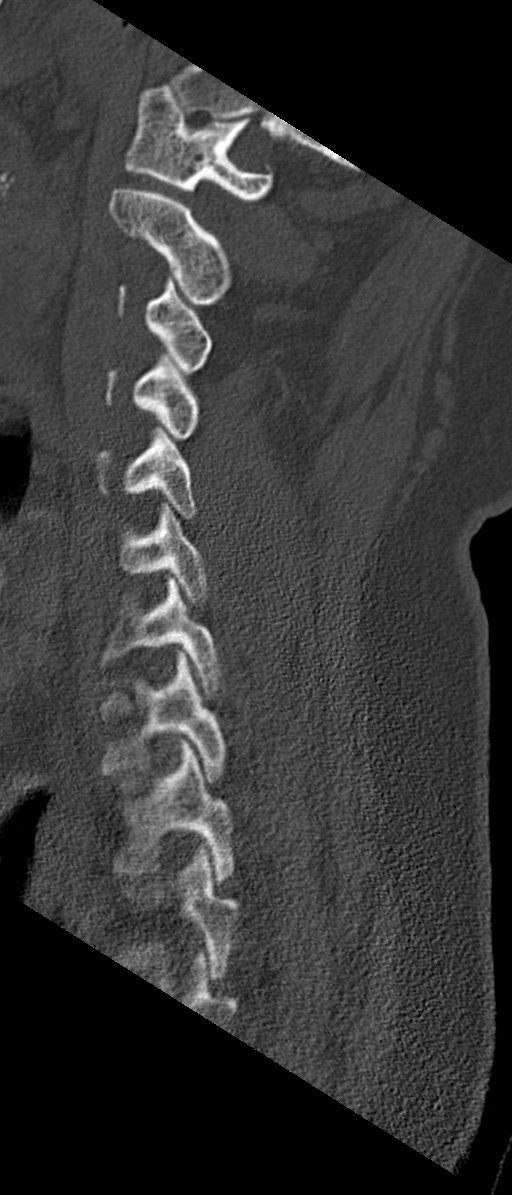
[im 47/82  bone]
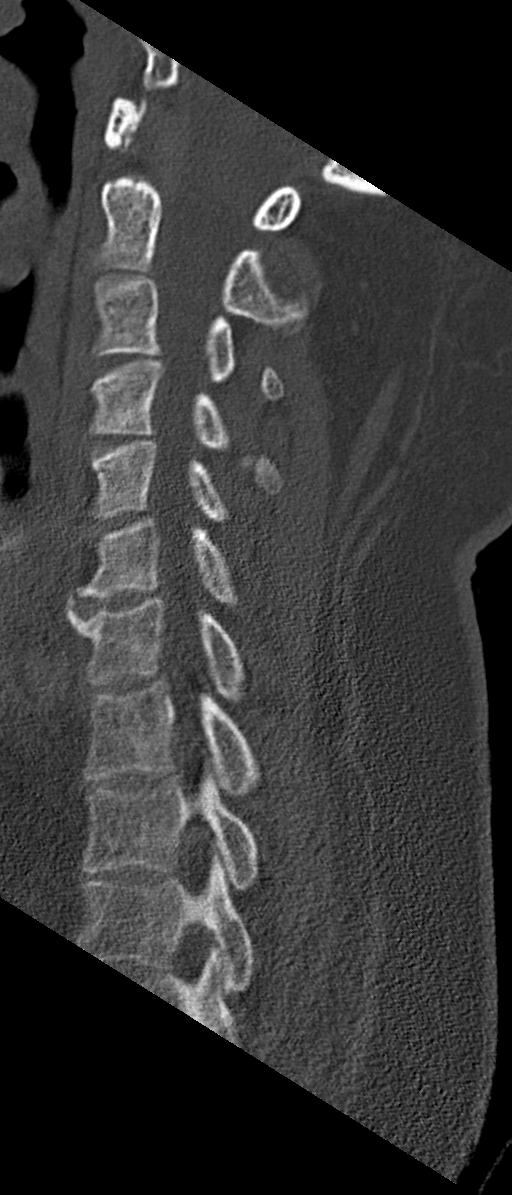
[im 58/82  bone]
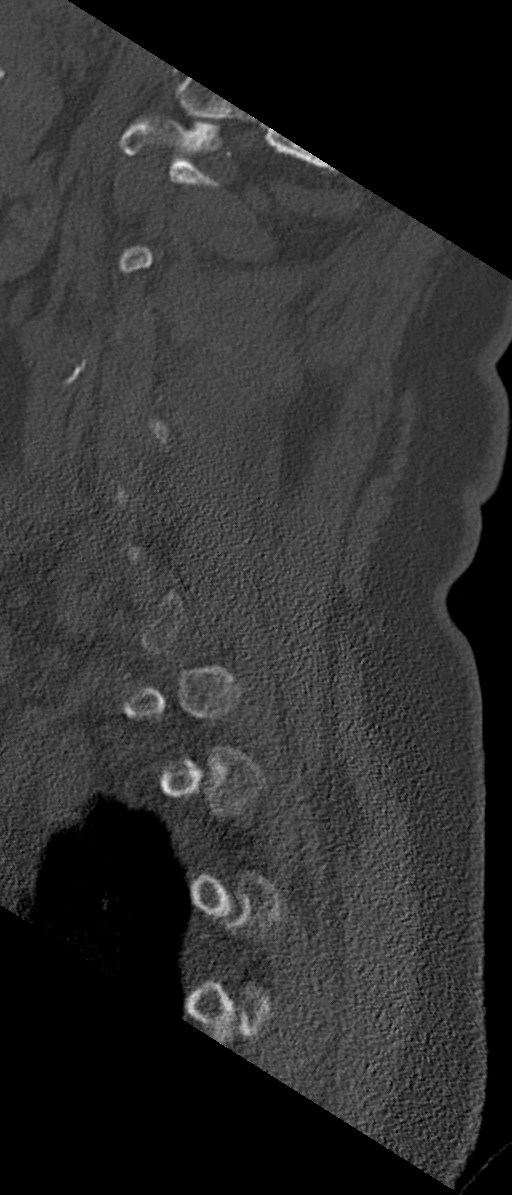

[Series 9: coronal bone · coronal · 0.32mm/px · 1 of 58 slices shown]
[im 29/58  bone]
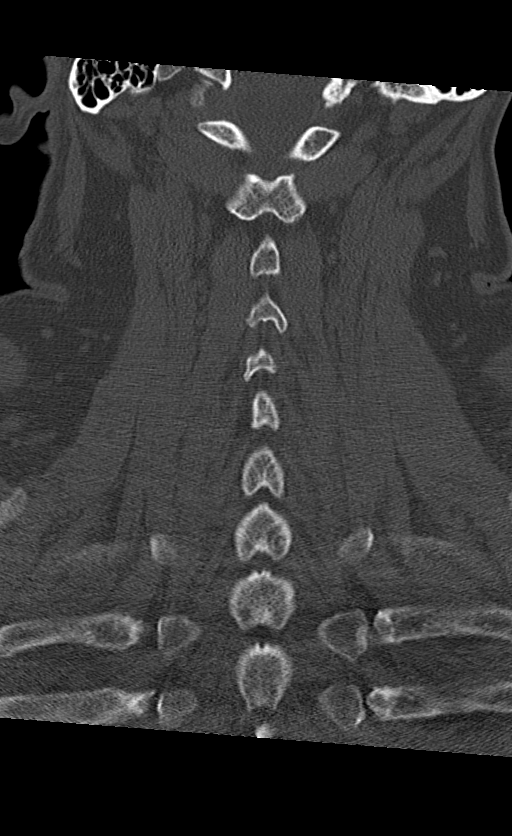

[Series 10: orthogonal bone · axial · 0.22mm/px · z∈[-345,-210]mm · 3 of 134 slices shown, 4 images]
[im 27/134  soft-tissue]
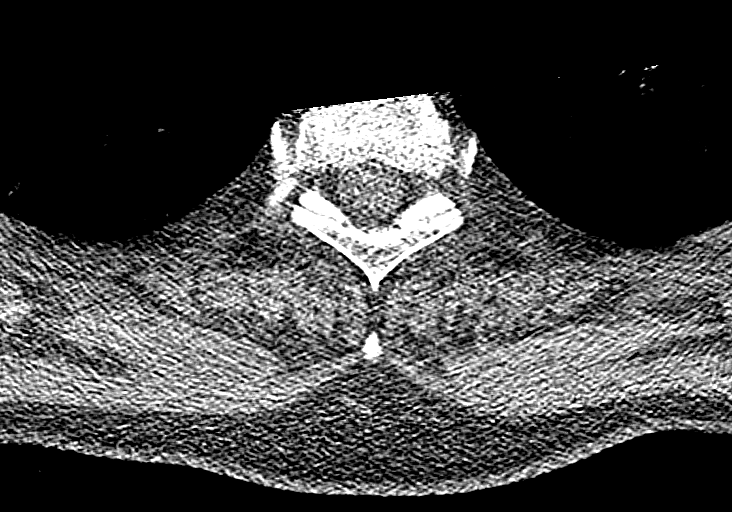
[im 27/134  bone]
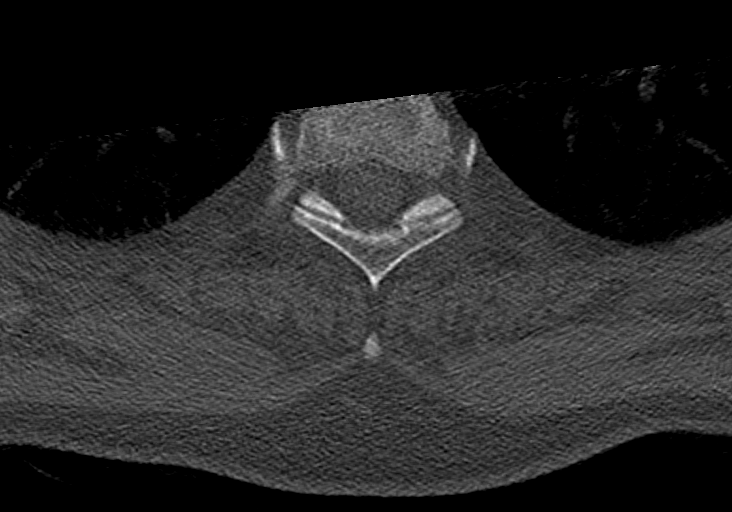
[im 80/134  bone]
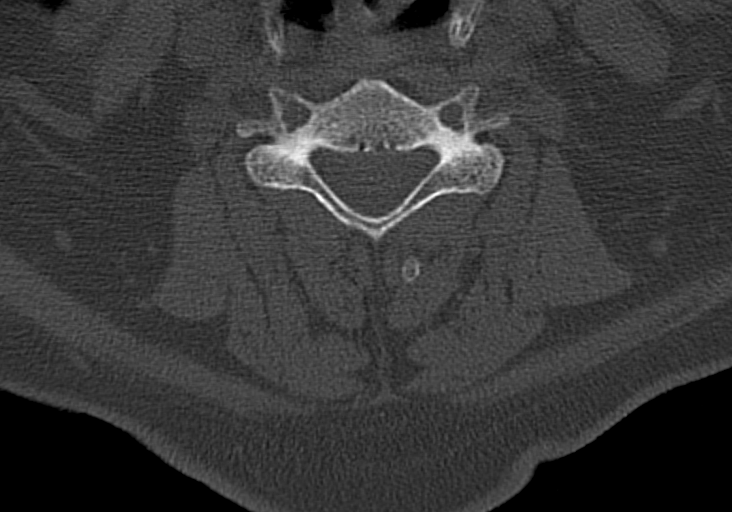
[im 107/134  bone]
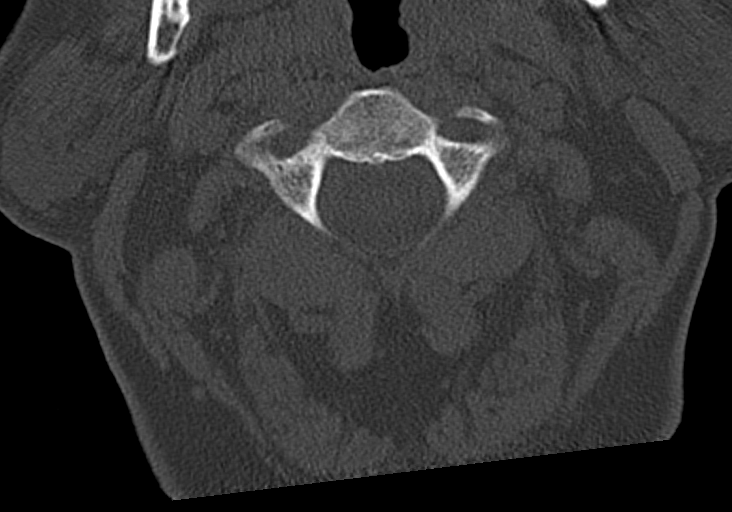

[9 of 33 positions shown; findings below may reference images not displayed]

FINDINGS: CT HEAD FINDINGS

Brain: There is mild age-related atrophy and chronic microvascular
ischemic changes. There is no acute intracranial hemorrhage. No mass
effect or midline shift. No extra-axial fluid collection.

Vascular: No hyperdense vessel or unexpected calcification.

Skull: Normal. Negative for fracture or focal lesion.

Sinuses/Orbits: Mild mucoperiosteal thickening of paranasal sinuses.
No air-fluid levels. The mastoid air cells are clear.

Other: None

CT CERVICAL SPINE FINDINGS

Alignment: Normal.

Skull base and vertebrae: No acute fracture. No primary bone lesion
or focal pathologic process.

Soft tissues and spinal canal: No prevertebral fluid or swelling. No
visible canal hematoma.

Disc levels:  Mild degenerative changes at C6-C7.

Upper chest: Negative.

Other: None
IMPRESSION: 1. No acute intracranial pathology.
2. No acute/traumatic cervical spine pathology.

## 2018-11-13 MED ORDER — SODIUM CHLORIDE 0.9 % IV SOLN
1000.0000 mL | Freq: Once | INTRAVENOUS | Status: AC
  Administered 2018-11-13: 1000 mL via INTRAVENOUS

## 2018-11-13 MED ORDER — ONDANSETRON 4 MG PO TBDP
4.0000 mg | ORAL_TABLET | Freq: Once | ORAL | Status: AC | PRN
  Administered 2018-11-13: 4 mg via ORAL
  Filled 2018-11-13: qty 1

## 2018-11-13 MED ORDER — ONDANSETRON 4 MG PO TBDP: 4.0000 mg | ORAL_TABLET | Freq: Three times a day (TID) | ORAL | 0 refills | Status: DC | PRN

## 2018-11-13 MED ORDER — ALUM & MAG HYDROXIDE-SIMETH 200-200-20 MG/5ML PO SUSP
30.0000 mL | Freq: Once | ORAL | Status: AC
  Administered 2018-11-13: 30 mL via ORAL
  Filled 2018-11-13: qty 30

## 2018-11-13 MED ORDER — ONDANSETRON HCL 4 MG/2ML IJ SOLN
4.0000 mg | Freq: Once | INTRAMUSCULAR | Status: AC
  Administered 2018-11-13: 4 mg via INTRAVENOUS
  Filled 2018-11-13: qty 2

## 2018-11-13 NOTE — ED Notes (Signed)
Pt on the toilet - advised to let me know when done so I could put his iv in.

## 2018-11-13 NOTE — ED Provider Notes (Signed)
[facility] Emergency Department Provider Note   ____________________________________________    I have reviewed the triage vital signs and the nursing notes.   HISTORY  Chief Complaint Vomiting; Diarrhea; and Abdominal Pain     HPI Miguel [name] is a 57 y.o. male who presents with nausea vomiting and abdominal pain.  Patient reports yesterday he developed nausea vomiting and diarrhea, he complains of upper abdominal pain as well.  He reports subjective fever, positive chills.  Has not take anything for this.  No sick contacts reported.  No recent travel.  Describes the pain as aching.  Past Medical History:  Diagnosis Date  . Alcohol abuse   . Asthma   . GERD (gastroesophageal reflux disease)   . Gout   . Hypertension   . OCD (obsessive compulsive disorder)   . Renal colic     Patient Active Problem List   Diagnosis Date Noted  . Breast lump or mass 10/04/2018  . Sepsis secondary to UTI ([place]) 09/14/2018  . Asthma 07/07/2018  . GERD (gastroesophageal reflux disease) 07/07/2018  . OCD (obsessive compulsive disorder) 07/07/2018  . Self-inflicted laceration of wrist 03/10/2018  . Leg hematoma 12/25/2017  . Suicide and self-inflicted injury by cutting and piercing instrument ([place]) 03/10/2017  . Severe recurrent major 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 ([place]) 03/09/2017  . Substance induced mood disorder ([place]) 08/15/2016  . Involuntary commitment 08/15/2016  . Alcohol use disorder, severe, dependence ([place]) 02/05/2016  . Hypertension 12/05/2015  . Tachycardia 12/05/2015  . Gout 11/13/2015  . Chronic back pain 05/02/2015    Past Surgical History:  Procedure Laterality Date  . CHOLECYSTECTOMY  2012    Prior to Admission medications   Medication Sig Start Date End Date Taking? Authorizing Provider  albuterol (VENTOLIN HFA) 108 (90 Base) MCG/ACT inhaler Inhale 2 puffs into the lungs every 4 (four) hours as needed. 09/01/18   Tukov-Yual,  Arlyss Gandy, NP  allopurinol (ZYLOPRIM) 300 MG tablet Take 1 tablet (300 mg total) by mouth daily. 09/01/18   Tukov-Yual, Arlyss Gandy, NP  Fluticasone-Salmeterol (ADVAIR DISKUS) 100-50 MCG/DOSE AEPB Inhale 1 puff into the lungs 2 (two) times daily. 09/01/18   Tukov-Yual, Arlyss Gandy, NP  folic acid (FOLVITE) 1 MG tablet Take 1 tablet (1 mg total) by mouth daily. 09/01/18   Tukov-Yual, Arlyss Gandy, NP  lisinopril (PRINIVIL,ZESTRIL) 20 MG tablet Take 0.5 tablets (10 mg total) by mouth daily. 10/04/18   Iloabachie, Chioma E, NP  Multiple Vitamins-Minerals (MULTIVITAMIN WITH MINERALS) tablet Take 1 tablet by mouth daily. 09/01/18   Tukov-Yual, Arlyss Gandy, NP  ondansetron (ZOFRAN ODT) 4 MG disintegrating tablet Take 1 tablet (4 mg total) by mouth every 8 (eight) hours as needed for nausea or vomiting. 11/13/18   Lavonia Drafts, MD  pantoprazole (PROTONIX) 40 MG tablet Take 1 tablet (40 mg total) by mouth daily. 09/01/18   Tukov-Yual, Arlyss Gandy, NP     Allergies Patient has no known allergies.  Family History  Problem Relation Age of Onset  . Alcohol abuse Father   . Breast cancer Mother 29    Social History Social History   Tobacco Use  . Smoking status: Former Research scientist (life sciences)  . Smokeless tobacco: Never Used  . Tobacco comment: quit 30 years ago  Substance Use Topics  . Alcohol use: Not Currently    Alcohol/week: 7.0 standard drinks    Types: 7 Glasses of wine per week    Comment: per pt "i do sometimes about a glass of wine  with the football game"  . Drug use: No    Review of Systems  Constitutional: No fever/chills Eyes: No visual changes.  ENT: No sore throat. Cardiovascular: Denies chest pain. Respiratory: Denies shortness of breath. Gastrointestinal: As above Genitourinary: Negative for dysuria. Musculoskeletal: Negative for back pain. Skin: Negative for rash. Neurological: Negative for headaches    ____________________________________________   PHYSICAL EXAM:  VITAL  SIGNS: ED Triage Vitals  Enc Vitals Group     BP 11/13/18 1749 (!) 183/99     Pulse Rate 11/13/18 1749 (!) 121     Resp 11/13/18 1749 20     Temp 11/13/18 1749 99.2 F (37.3 C)     Temp Source 11/13/18 1749 Oral     SpO2 11/13/18 1749 100 %     Weight 11/13/18 1751 83.9 kg (185 lb)     Height 11/13/18 1751 1.829 m (6')     Head Circumference --      Peak Flow --      Pain Score 11/13/18 1751 8     Pain Loc --      Pain Edu? --      Excl. in [place]? --     Constitutional: Alert and oriented.  Eyes: Conjunctivae are normal.   Nose: No congestion/rhinnorhea. Mouth/Throat: Mucous membranes are moist.    Cardiovascular: Normal rate, regular rhythm. Grossly normal heart sounds.  Good peripheral circulation. Respiratory: Normal respiratory effort.  No retractions. Lungs CTAB. Gastrointestinal: Soft and nontender. No distention.  No CVA tenderness.  Musculoskeletal:  Warm and well perfused Neurologic:  Normal speech and language. No gross focal neurologic deficits are appreciated.  Skin:  Skin is warm, dry and intact. No rash noted. Psychiatric: Mood and affect are normal. Speech and behavior are normal.  ____________________________________________   LABS (all labs ordered are listed, but only abnormal results are displayed)  Labs Reviewed  COMPREHENSIVE METABOLIC PANEL - Abnormal; Notable for the following components:      Result Value   CO2 18 (*)    Glucose, Bld 158 (*)    Creatinine, Ser 1.37 (*)    Calcium 8.8 (*)    Total Bilirubin 3.3 (*)    GFR calc non Af Amer 56 (*)    Anion gap 22 (*)    All other components within normal limits  CBC - Abnormal; Notable for the following components:   WBC 12.7 (*)    MCH 25.5 (*)    RDW 19.2 (*)    All other components within normal limits  URINALYSIS, COMPLETE (UACMP) WITH MICROSCOPIC - Abnormal; Notable for the following components:   Color, Urine YELLOW (*)    APPearance CLEAR (*)    Hgb urine dipstick SMALL (*)     Ketones, ur 80 (*)    Protein, ur 30 (*)    Bacteria, UA RARE (*)    All other components within normal limits  LIPASE, BLOOD   ____________________________________________  EKG  ED ECG REPORT I, Lavonia Drafts, the attending physician, personally viewed and interpreted this ECG.  Date: 11/13/2018  Rhythm: Sinus tachycardia QRS Axis: normal Intervals: normal ST/T Wave abnormalities: normal Narrative Interpretation: no evidence of acute ischemia  ____________________________________________  RADIOLOGY  None ____________________________________________   PROCEDURES  Procedure(s) performed: No  Procedures   Critical Care performed: No ____________________________________________   INITIAL IMPRESSION / ASSESSMENT AND PLAN / ED COURSE  Pertinent labs & imaging results that were available during my care of the patient were reviewed by me and considered in  my medical decision making (see chart for details).  Patient presents with nausea vomiting diarrhea, suspicious for viral gastroenteritis, abdominal exam is reassuring.  Will treat with IV fluids, IV Zofran and reevaluate.  Lab work is overall unremarkable.  ----------------------------------------- 11:19 PM on 11/13/2018 -----------------------------------------  Patient is feeling significantly better, heart rate 92 on my exam.  He would like to be discharged because his wife is admitted    ____________________________________________   FINAL CLINICAL IMPRESSION(S) / ED DIAGNOSES  Final diagnoses:  Gastroenteritis        Note:  This document was prepared using Dragon voice recognition software and may include unintentional dictation errors.   Lavonia Drafts, MD 11/13/18 [phone]

## 2018-11-13 NOTE — ED Notes (Signed)
Topaz pad not working. Pt verbalized understanding discharge medications and follow ups.

## 2018-11-13 NOTE — ED Triage Notes (Signed)
Patient presents to the ED with left upper quadrant abdominal pain, nausea, vomiting and diarrhea x 2 days.  Patient reports vomiting, "100 times yesterday and 50 times today."  Patient is also complaining of left middle finger "cramping up" earlier today.  Patient states, "it was kind of a scary experience, I couldn't move it, it was stiff as a board."  Patient's wife has been admitted to the hospital for different symptoms.

## 2018-11-13 NOTE — ED Notes (Signed)
Pt sleeping. 

## 2018-11-20 ENCOUNTER — Encounter: Payer: Self-pay | Admitting: Emergency Medicine

## 2018-11-20 ENCOUNTER — Other Ambulatory Visit: Payer: Self-pay

## 2018-11-20 ENCOUNTER — Emergency Department: Payer: Self-pay

## 2018-11-20 ENCOUNTER — Emergency Department
Admission: EM | Admit: 2018-11-20 | Discharge: 2018-11-21 | Disposition: A | Discharge status: Home / Self Care | Payer: Self-pay | Attending: Emergency Medicine | Admitting: Emergency Medicine

## 2018-11-20 DIAGNOSIS — Z79899 Other long term (current) drug therapy: Secondary | ICD-10-CM | POA: Insufficient documentation

## 2018-11-20 DIAGNOSIS — F10930 Alcohol use, unspecified with withdrawal, uncomplicated: Secondary | ICD-10-CM

## 2018-11-20 DIAGNOSIS — F10239 Alcohol dependence with withdrawal, unspecified: Secondary | ICD-10-CM | POA: Insufficient documentation

## 2018-11-20 DIAGNOSIS — I10 Essential (primary) hypertension: Secondary | ICD-10-CM | POA: Insufficient documentation

## 2018-11-20 DIAGNOSIS — F10230 Alcohol dependence with withdrawal, uncomplicated: Secondary | ICD-10-CM

## 2018-11-20 DIAGNOSIS — Z87891 Personal history of nicotine dependence: Secondary | ICD-10-CM | POA: Insufficient documentation

## 2018-11-20 DIAGNOSIS — J45909 Unspecified asthma, uncomplicated: Secondary | ICD-10-CM | POA: Insufficient documentation

## 2018-11-20 DIAGNOSIS — Y909 Presence of alcohol in blood, level not specified: Secondary | ICD-10-CM | POA: Insufficient documentation

## 2018-11-20 DIAGNOSIS — R1012 Left upper quadrant pain: Secondary | ICD-10-CM | POA: Insufficient documentation

## 2018-11-20 HISTORY — DX: Calculus of kidney: N20.0

## 2018-11-20 LAB — BASIC METABOLIC PANEL
Anion gap: 14 (ref 5–15)
BUN: 19 mg/dL (ref 6–20)
CO2: 24 mmol/L (ref 22–32)
Calcium: 8.5 mg/dL — ABNORMAL LOW (ref 8.9–10.3)
Chloride: 104 mmol/L (ref 98–111)
Creatinine, Ser: 1.2 mg/dL (ref 0.61–1.24)
GFR calc Af Amer: >60 mL/min (ref >60)
GFR calc non Af Amer: >60 mL/min (ref >60)
Glucose, Bld: 130 mg/dL — ABNORMAL HIGH (ref 70–99)
Potassium: 3.6 mmol/L (ref 3.5–5.1)
Sodium: 142 mmol/L (ref 135–145)

## 2018-11-20 LAB — CBC
HCT: 43.5 % (ref 39.0–52.0)
Hemoglobin: 13.8 g/dL (ref 13.0–17.0)
MCH: 25.7 pg — ABNORMAL LOW (ref 26.0–34.0)
MCHC: 31.7 g/dL (ref 30.0–36.0)
MCV: 81.2 fL (ref 80.0–100.0)
Platelets: 194 10*3/uL (ref 150–400)
RBC: 5.36 MIL/uL (ref 4.22–5.81)
RDW: 19.7 % — ABNORMAL HIGH (ref 11.5–15.5)
WBC: 5.2 10*3/uL (ref 4.0–10.5)
nRBC: 0 % (ref 0.0–0.2)

## 2018-11-20 LAB — TROPONIN I: Troponin I: <0.03 ng/mL (ref <0.03)

## 2018-11-20 LAB — LIPASE, BLOOD: Lipase: 50 U/L (ref 11–51)

## 2018-11-20 MED ORDER — ALUM & MAG HYDROXIDE-SIMETH 200-200-20 MG/5ML PO SUSP
30.0000 mL | Freq: Once | ORAL | Status: AC
  Administered 2018-11-20: 30 mL via ORAL
  Filled 2018-11-20: qty 30

## 2018-11-20 MED ORDER — ONDANSETRON 4 MG PO TBDP
4.0000 mg | ORAL_TABLET | Freq: Once | ORAL | Status: AC
  Administered 2018-11-20: 4 mg via ORAL

## 2018-11-20 MED ORDER — CHLORDIAZEPOXIDE HCL 25 MG PO CAPS
50.0000 mg | ORAL_CAPSULE | Freq: Once | ORAL | Status: AC
  Administered 2018-11-21: 50 mg via ORAL
  Filled 2018-11-20: qty 2

## 2018-11-20 MED ORDER — SUCRALFATE 1 G PO TABS
1.0000 g | ORAL_TABLET | Freq: Once | ORAL | Status: AC
  Administered 2018-11-20: 1 g via ORAL
  Filled 2018-11-20: qty 1

## 2018-11-20 MED ORDER — PANTOPRAZOLE SODIUM 40 MG PO TBEC
40.0000 mg | DELAYED_RELEASE_TABLET | Freq: Once | ORAL | Status: AC
  Administered 2018-11-20: 40 mg via ORAL
  Filled 2018-11-20: qty 1

## 2018-11-20 MED ORDER — MISOPROSTOL 200 MCG PO TABS
200.0000 ug | ORAL_TABLET | Freq: Once | ORAL | Status: AC
  Administered 2018-11-20: 200 ug via ORAL
  Filled 2018-11-20: qty 1

## 2018-11-20 MED ORDER — ONDANSETRON 4 MG PO TBDP
ORAL_TABLET | ORAL | Status: AC
  Filled 2018-11-20: qty 1

## 2018-11-20 NOTE — ED Notes (Addendum)
Pt finished in the bathroom and back from xray; will finish triage; pt adds he's out of Zofran and Protonix

## 2018-11-20 NOTE — ED Provider Notes (Signed)
[facility] Emergency Department Provider Note  ____________________________________________   First MD Initiated Contact with Patient 11/20/18 2310     (approximate)  I have reviewed the triage vital signs and the nursing notes.   HISTORY  Chief Complaint Chest Pain; Nausea; and Emesis    HPI Miguel [name] is a 58 y.o. male who presents to the emergency department via EMS with "I think I have a kidney stone".   The patient's symptoms began acutely roughly half hour prior to arrival.  He had sudden onset severe left upper quadrant pain that is nonradiating and constant.  Associated with nausea.  He says that he has been out of his Zofran and Protonix for about a week as his wife has been sick and in the hospital.  The patient is well-known to our emergency department and myself personally and he has a longstanding history of alcohol abuse as well as GERD.  He denies feeling "shaky".  He was drinking alcohol this evening.   Past Medical History:  Diagnosis Date  . Alcohol abuse   . Asthma   . GERD (gastroesophageal reflux disease)   . Gout   . Hypertension   . Kidney stone   . OCD (obsessive compulsive disorder)   . Renal colic     Patient Active Problem List   Diagnosis Date Noted  . Breast lump or mass 10/04/2018  . Sepsis secondary to UTI ([place]) 09/14/2018  . Asthma 07/07/2018  . GERD (gastroesophageal reflux disease) 07/07/2018  . OCD (obsessive compulsive disorder) 07/07/2018  . Self-inflicted laceration of wrist 03/10/2018  . Leg hematoma 12/25/2017  . Suicide and self-inflicted injury by cutting and piercing instrument ([place]) 03/10/2017  . Severe recurrent major 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 ([place]) 03/09/2017  . Substance induced mood disorder ([place]) 08/15/2016  . Involuntary commitment 08/15/2016  . Alcohol use disorder, severe, dependence ([place]) 02/05/2016  . Hypertension 12/05/2015  . Tachycardia 12/05/2015  . Gout 11/13/2015  .  Chronic back pain 05/02/2015    Past Surgical History:  Procedure Laterality Date  . CHOLECYSTECTOMY  2012    Prior to Admission medications   Medication Sig Start Date End Date Taking? Authorizing Provider  albuterol (VENTOLIN HFA) 108 (90 Base) MCG/ACT inhaler Inhale 2 puffs into the lungs every 4 (four) hours as needed. 09/01/18  Yes Tukov-Yual, Arlyss Gandy, NP  allopurinol (ZYLOPRIM) 300 MG tablet Take 1 tablet (300 mg total) by mouth daily. 09/01/18  Yes Tukov-Yual, Magdalene S, NP  Fluticasone-Salmeterol (ADVAIR DISKUS) 100-50 MCG/DOSE AEPB Inhale 1 puff into the lungs 2 (two) times daily. 09/01/18  Yes Tukov-Yual, Arlyss Gandy, NP  folic acid (FOLVITE) 1 MG tablet Take 1 tablet (1 mg total) by mouth daily. 09/01/18  Yes Tukov-Yual, Magdalene S, NP  lisinopril (PRINIVIL,ZESTRIL) 20 MG tablet Take 0.5 tablets (10 mg total) by mouth daily. 10/04/18  Yes Iloabachie, Chioma E, NP  Multiple Vitamins-Minerals (MULTIVITAMIN WITH MINERALS) tablet Take 1 tablet by mouth daily. 09/01/18  Yes Tukov-Yual, Magdalene S, NP  ondansetron (ZOFRAN ODT) 4 MG disintegrating tablet Take 1 tablet (4 mg total) by mouth every 8 (eight) hours as needed for nausea or vomiting. 11/21/18   Darel Hong, MD  pantoprazole (PROTONIX) 40 MG tablet Take 1 tablet (40 mg total) by mouth daily. 11/21/18   Darel Hong, MD    Allergies Patient has no known allergies.  Family History  Problem Relation Age of Onset  . Alcohol abuse Father   . Breast cancer Mother 7  Social History Social History   Tobacco Use  . Smoking status: Former Research scientist (life sciences)  . Smokeless tobacco: Never Used  . Tobacco comment: quit 30 years ago  Substance Use Topics  . Alcohol use: Yes    Alcohol/week: 7.0 standard drinks    Types: 7 Glasses of wine per week    Comment: wine earlier today  . Drug use: No    Review of Systems Constitutional: No fever/chills Eyes: No visual changes. ENT: No sore throat. Cardiovascular: Denies chest  pain. Respiratory: Denies shortness of breath. Gastrointestinal: Positive for abdominal pain.  Positive for nausea, no vomiting.  No diarrhea.  No constipation. Genitourinary: Negative for dysuria. Musculoskeletal: Negative for back pain. Skin: Negative for rash. Neurological: Negative for headaches, focal weakness or numbness.   ____________________________________________   PHYSICAL EXAM:  VITAL SIGNS: ED Triage Vitals  Enc Vitals Group     BP 11/20/18 2125 (!) 136/97     Pulse Rate 11/20/18 2125 98     Resp 11/20/18 2125 18     Temp 11/20/18 2125 98.3 F (36.8 C)     Temp Source 11/20/18 2125 Oral     SpO2 11/20/18 2125 100 %     Weight 11/20/18 2126 185 lb (83.9 kg)     Height 11/20/18 2126 6' (1.829 m)     Head Circumference --      Peak Flow --      Pain Score 11/20/18 2124 8     Pain Loc --      Pain Edu? --      Excl. in [place]? --     Constitutional: Alert and oriented x4 appears somewhat uncomfortable appearing although nontoxic in no diaphoresis Eyes: PERRL EOMI. midrange and brisk Head: Atraumatic. Nose: No congestion/rhinnorhea. Mouth/Throat: No trismus mild tongue fasciculations Neck: No stridor.   Cardiovascular: Normal rate, regular rhythm. Grossly normal heart sounds.  Good peripheral circulation. Respiratory: Normal respiratory effort.  No retractions. Lungs CTAB and moving good air Gastrointestinal: Soft mild left upper quadrant tenderness with no rebound or guarding no peritonitis no costovertebral tenderness Musculoskeletal: No lower extremity edema mild hand tremors Neurologic:  Normal speech and language. No gross focal neurologic deficits are appreciated. Skin:  Skin is warm, dry and intact. No rash noted. Psychiatric: Mood and affect are normal. Speech and behavior are normal.    ____________________________________________   DIFFERENTIAL includes but not limited to  Renal colic, nephrolithiasis, alcohol withdrawal, gastritis,  pancreatitis ____________________________________________   LABS (all labs ordered are listed, but only abnormal results are displayed)  Labs Reviewed  BASIC METABOLIC PANEL - Abnormal; Notable for the following components:      Result Value   Glucose, Bld 130 (*)    Calcium 8.5 (*)    All other components within normal limits  CBC - Abnormal; Notable for the following components:   MCH 25.7 (*)    RDW 19.7 (*)    All other components within normal limits  TROPONIN I  LIPASE, BLOOD    Lab work reviewed by me with no acute disease noted __________________________________________  EKG  ED ECG REPORT I, Darel Hong, the attending physician, personally viewed and interpreted this ECG.  Date: 11/20/2018 EKG Time:  Rate: 100 Rhythm: normal sinus rhythm QRS Axis: Leftward axis Intervals: normal ST/T Wave abnormalities: normal Narrative Interpretation: no evidence of acute ischemia  ____________________________________________  RADIOLOGY  Chest x-ray reviewed by me with no acute disease CT stone reviewed by me with with no acute disease ____________________________________________  PROCEDURES  Procedure(s) performed: no  Procedures  Critical Care performed: no  ____________________________________________   INITIAL IMPRESSION / ASSESSMENT AND PLAN / ED COURSE  Pertinent labs & imaging results that were available during my care of the patient were reviewed by me and considered in my medical decision making (see chart for details).   As part of my medical decision making, I reviewed the following data within the [place] History obtained from family if available, nursing notes, old chart and ekg, as well as notes from prior ED visits.  The patient comes to the emergency department with sudden onset left upper quadrant pain.  He says this feels like previous kidney stones although the patient has been noncompliant with his Protonix and has a  longstanding history of alcohol abuse as well as gastric reflux.  I will treat him symptomatically with Cytotec, Maalox, and Carafate along with Librium for his impending alcohol withdrawal but also CT to evaluate for new stone.     Fortunately the patient's imaging is negative for acute pathology and his symptoms are significantly improved.  He has no evidence of alcohol withdrawal at this time.  Will be discharged home with primary care follow-up.  He verbalizes understanding and agreement with the plan. ____________________________________________   FINAL CLINICAL IMPRESSION(S) / ED DIAGNOSES  Final diagnoses:  Left upper quadrant pain  Alcohol withdrawal syndrome without complication ([place])      NEW MEDICATIONS STARTED DURING THIS VISIT:  Discharge Medication List as of 11/21/2018 12:06 AM       Note:  This document was prepared using Dragon voice recognition software and may include unintentional dictation errors.    Darel Hong, MD 11/22/18 [phone]

## 2018-11-20 NOTE — ED Notes (Addendum)
ED Provider at bedside.  Pt reporting left side pain, "I have a kidney stone acting up" while at home watching TV with wife, pt c/o pain in left abdomen, worse with palpation, pt reports worse  Pt requesting Protonix meds because unable to fill meds d/t wife in hospital

## 2018-11-20 NOTE — ED Triage Notes (Signed)
Pt arrived via EMS from home with c/o N/V and burning pain to center of his chest and upper abd; c/o feeling stiff in his back; pt here on 12/29 with same complaints; pt has not followed up with [facility] as directed;

## 2018-11-20 NOTE — ED Notes (Signed)
Pt to xray before triage finished and will finish/draw blood when he returns

## 2018-11-20 NOTE — ED Notes (Signed)
Patient transported to CT 

## 2018-11-20 NOTE — ED Notes (Signed)
Triage stopped for pt to use bathroom; says he thinks he needs to go but isn't sure.Miguel KitchenMarland [name]

## 2018-11-21 MED ORDER — ONDANSETRON 4 MG PO TBDP: 4.0000 mg | ORAL_TABLET | Freq: Three times a day (TID) | ORAL | 0 refills | Status: DC | PRN

## 2018-11-21 MED ORDER — PANTOPRAZOLE SODIUM 40 MG PO TBEC: 40.0000 mg | DELAYED_RELEASE_TABLET | Freq: Every day | ORAL | 0 refills | Status: DC

## 2018-11-21 NOTE — Discharge Instructions (Signed)
It was a pleasure to take care of you today, and thank you for coming to our emergency department.  If you have any questions or concerns before leaving please ask the nurse to grab me and I'm more than happy to go through your aftercare instructions again.  If you have any concerns once you are home that you are not improving or are in fact getting worse before you can make it to your follow-up appointment, please do not hesitate to call 911 and come back for further evaluation.  Darel Hong, MD  Results for orders placed or performed during the hospital encounter of 50/09/38  Basic metabolic panel  Result Value Ref Range   Sodium 142 135 - 145 mmol/L   Potassium 3.6 3.5 - 5.1 mmol/L   Chloride 104 98 - 111 mmol/L   CO2 24 22 - 32 mmol/L   Glucose, Bld 130 (H) 70 - 99 mg/dL   BUN 19 6 - 20 mg/dL   Creatinine, Ser 1.20 0.61 - 1.24 mg/dL   Calcium 8.5 (L) 8.9 - 10.3 mg/dL   GFR calc non Af Amer >60 >60 mL/min   GFR calc Af Amer >60 >60 mL/min   Anion gap 14 5 - 15  CBC  Result Value Ref Range   WBC 5.2 4.0 - 10.5 K/uL   RBC 5.36 4.22 - 5.81 MIL/uL   Hemoglobin 13.8 13.0 - 17.0 g/dL   HCT 43.5 39.0 - 52.0 %   MCV 81.2 80.0 - 100.0 fL   MCH 25.7 (L) 26.0 - 34.0 pg   MCHC 31.7 30.0 - 36.0 g/dL   RDW 19.7 (H) 11.5 - 15.5 %   Platelets 194 150 - 400 K/uL   nRBC 0.0 0.0 - 0.2 %  Troponin I - ONCE - STAT  Result Value Ref Range   Troponin I <0.03 <0.03 ng/mL  Lipase, blood  Result Value Ref Range   Lipase 50 11 - 51 U/L   Dg Chest 2 View  Result Date: 11/20/2018 CLINICAL DATA:  Central chest pain and burning. EXAM: CHEST - 2 VIEW COMPARISON:  11/05/2018 FINDINGS: The heart size and mediastinal contours are within normal limits. Minimal left basilar atelectasis. No acute pulmonary consolidation or edema. No effusion or pneumothorax. No free air beneath the diaphragm. The visualized skeletal structures are unremarkable. IMPRESSION: No active cardiopulmonary disease. Electronically  Signed   By: Ashley Royalty M.D.   On: 11/20/2018 21:47   Ct Renal Stone Study  Result Date: 11/20/2018 CLINICAL DATA:  Left flank pain. EXAM: CT ABDOMEN AND PELVIS WITHOUT CONTRAST TECHNIQUE: Multidetector CT imaging of the abdomen and pelvis was performed following the standard protocol without IV contrast. COMPARISON:  Most recent CT 09/14/2018 FINDINGS: Lower chest: Chronic lingular scarring. No acute findings. No pleural fluid. Hepatobiliary: Diffusely decreased density consistent with steatosis. No focal lesion on noncontrast exam. Postcholecystectomy without biliary dilatation. Pancreas: No ductal dilatation or inflammation. Spleen: Normal in size without focal abnormality. Adrenals/Urinary Tract: Normal adrenal glands. Multiple nonobstructing stones in the left kidney, including dominant left upper pole stone measuring 11 x 8 mm, unchanged. No hydronephrosis. The ureter is decompressed, no ureteral calculi. There are nonobstructing stones in the right kidney without right hydronephrosis or perinephric edema. The right ureter is decompressed. Urinary bladder is partially distended. No bladder stone or wall thickening. Stomach/Bowel: Small hiatal hernia. Ingested material in the stomach, no gastric wall thickening. Moderate duodenal diverticulum without inflammation. No small bowel dilatation, inflammation, or obstruction. Normal appendix. Submucosal fatty  infiltration of the ascending colon suggest prior inflammatory change. Mild rectal wall thickening, chronic. No acute bowel inflammation. Diverticulosis of the distal descending and sigmoid colon, no diverticulitis. Vascular/Lymphatic: Abdominal aorta is normal in caliber. Few prominent porta hepatis nodes are likely reactive. No enlarged lymph nodes in the abdomen or pelvis. Reproductive: Prominent prostate gland spans 5.1 cm. Other: No free air, free fluid, or intra-abdominal fluid collection. Musculoskeletal: There are no acute or suspicious osseous  abnormalities. IMPRESSION: 1. Bilateral nonobstructing nephrolithiasis. No hydronephrosis or obstructive uropathy. 2. Colonic diverticulosis without diverticulitis. 3. Hepatic steatosis. Electronically Signed   By: Keith Rake M.D.   On: 11/20/2018 23:58

## 2018-11-21 NOTE — ED Notes (Signed)
No peripheral IV placed this visit.   Discharge instructions reviewed with patient. Questions fielded by this RN. Patient verbalizes understanding of instructions. Patient discharged home in stable condition per rifenbark. No acute distress noted at time of discharge.

## 2018-12-01 IMAGING — CT CT ABD-PELV W/ CM
2 of 5 series · 15 of 46 positions shown, 17 images · IV contrast (APPLIED)
Comparison: CT of the abdomen and pelvis performed 11/13/2017

CLINICAL DATA: Acute onset of lower back pain and increased urinary
urgency.

EXAM:
CT ABDOMEN AND PELVIS WITH CONTRAST
TECHNIQUE: Multidetector CT imaging of the abdomen and pelvis was performed
using the standard protocol following bolus administration of
intravenous contrast.
CONTRAST:  100mL [id number] IOPAMIDOL ([id number]) INJECTION 61%

[Series 2: routine abd/pel with · axial · 0.75mm/px · z∈[-1003,-578]mm · 12 of 95 slices shown, 14 images]
[im 5/95  soft-tissue]
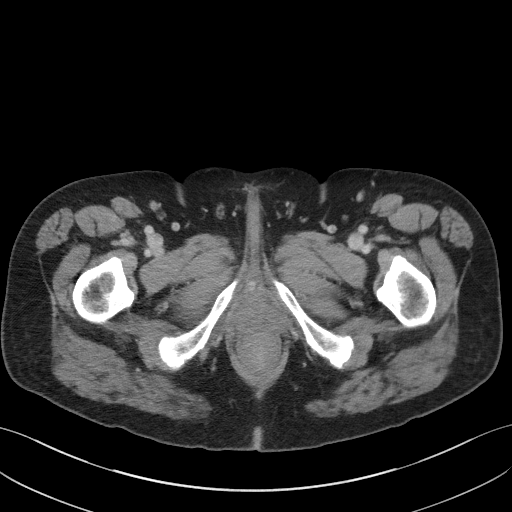
[im 5/95  bone]
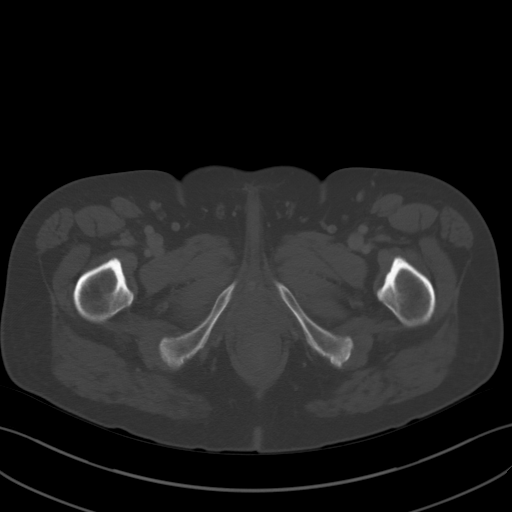
[im 15/95  soft-tissue]
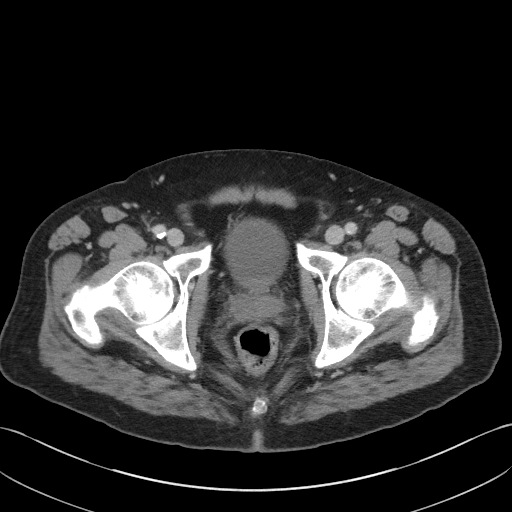
[im 20/95  soft-tissue]
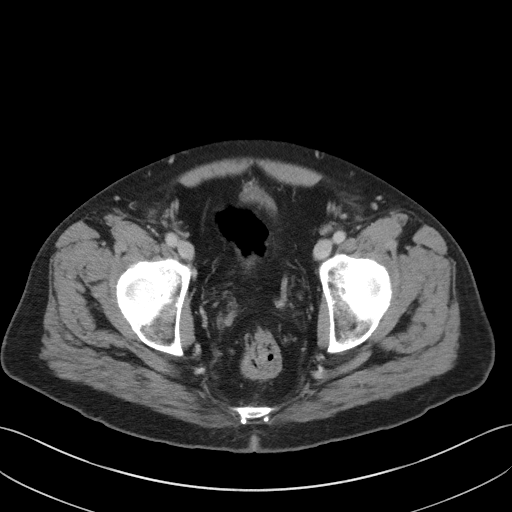
[im 30/95  soft-tissue]
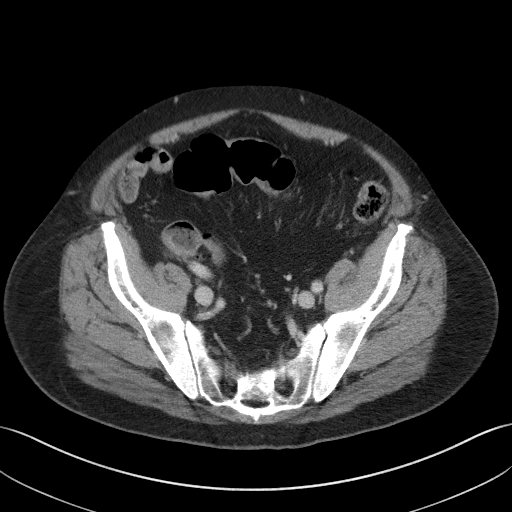
[im 35/95  soft-tissue]
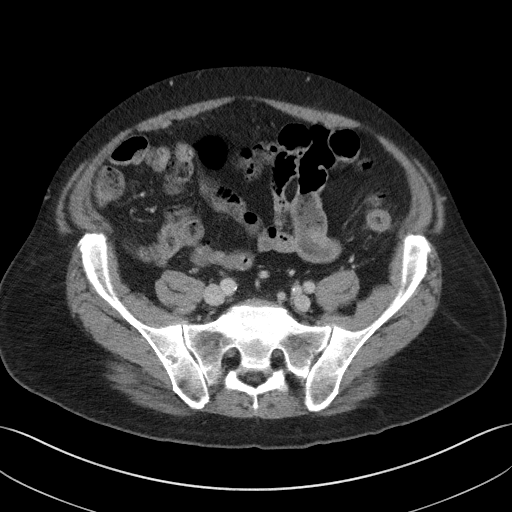
[im 45/95  soft-tissue]
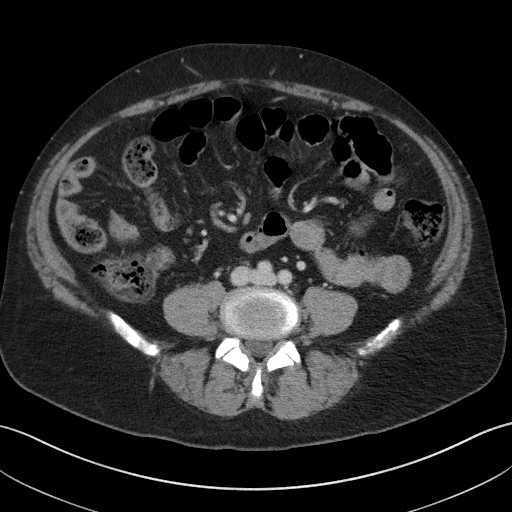
[im 50/95  soft-tissue]
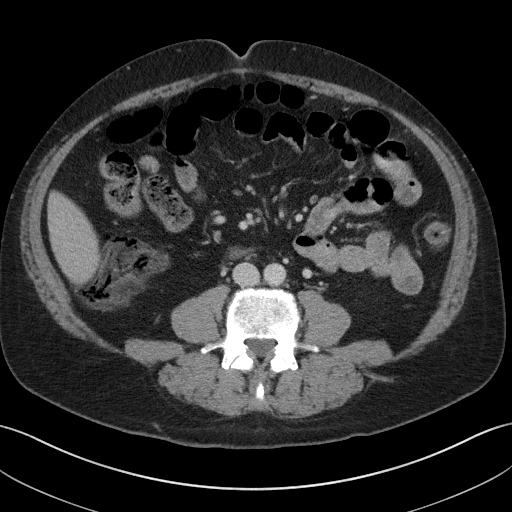
[im 60/95  soft-tissue]
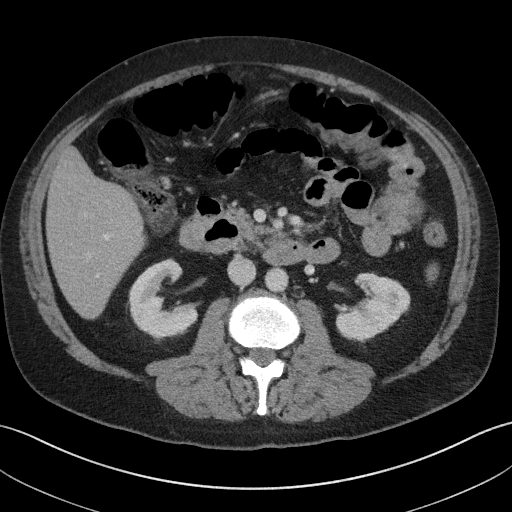
[im 65/95  soft-tissue]
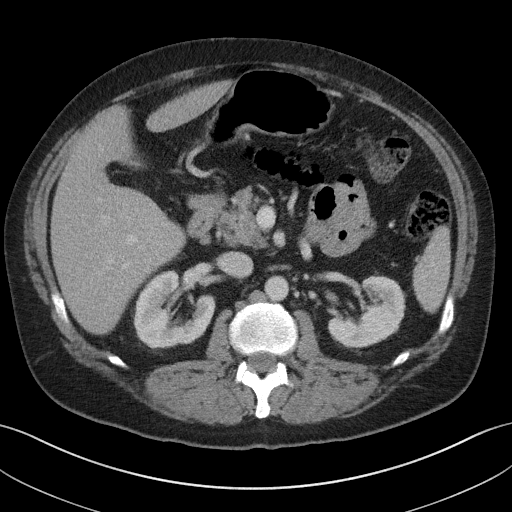
[im 65/95  bone]
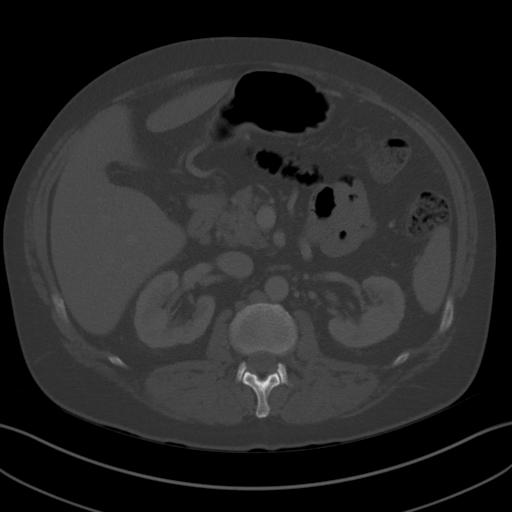
[im 75/95  soft-tissue]
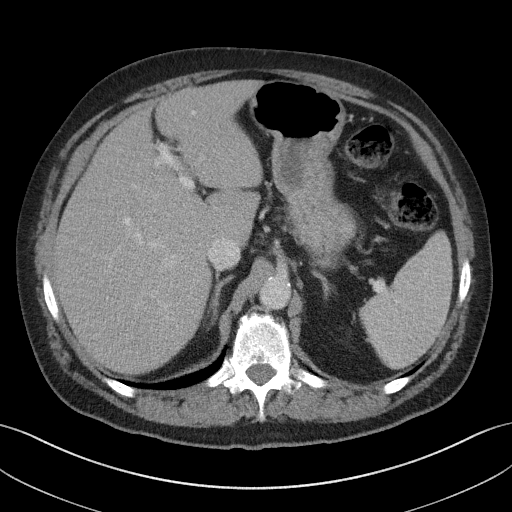
[im 80/95  soft-tissue]
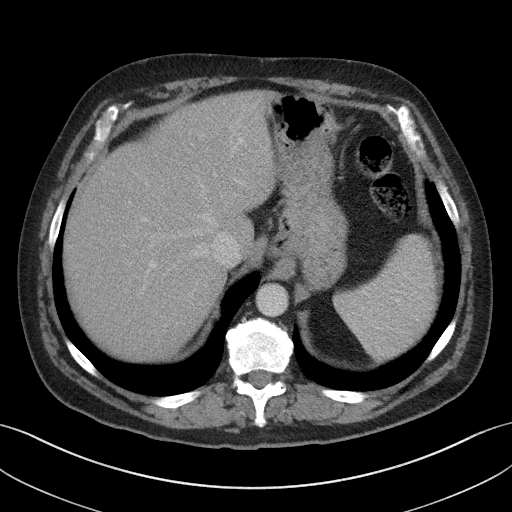
[im 90/95  soft-tissue]
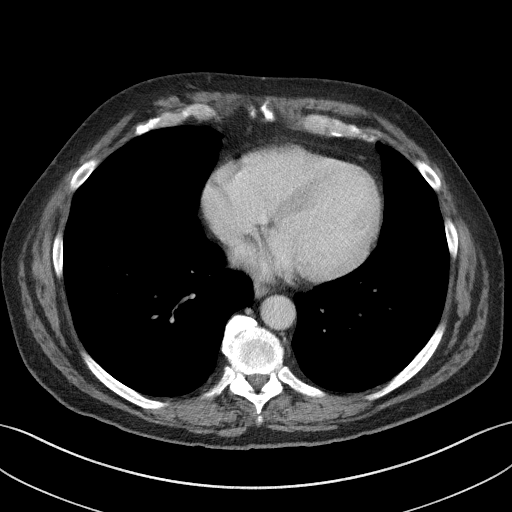

[Series 6: coronal st · coronal · 0.78mm/px · 3 of 95 slices shown]
[im 32/95  soft-tissue]
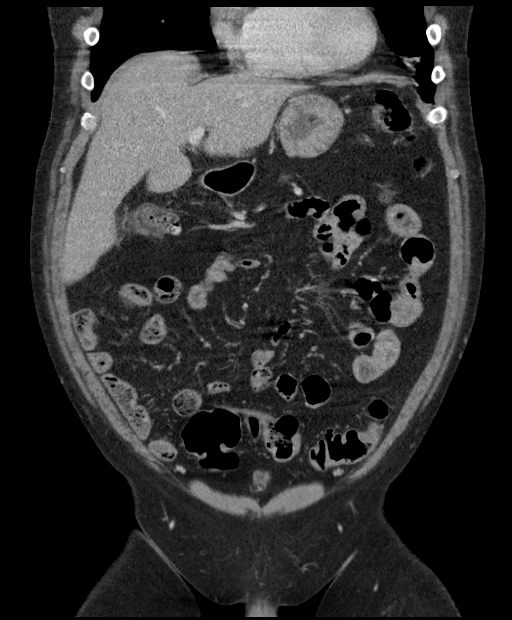
[im 42/95  soft-tissue]
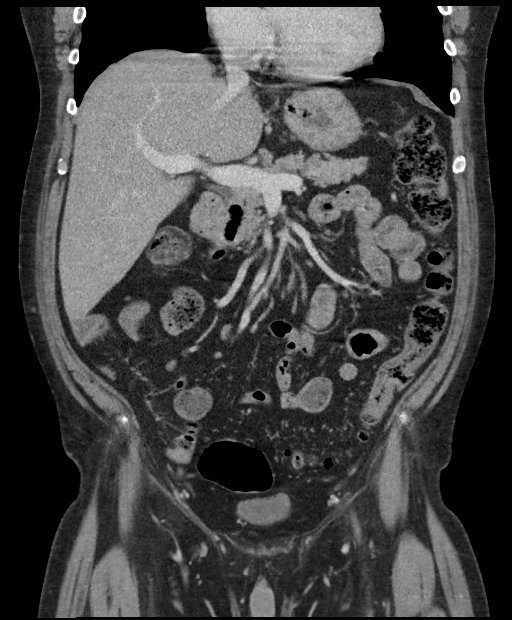
[im 53/95  soft-tissue]
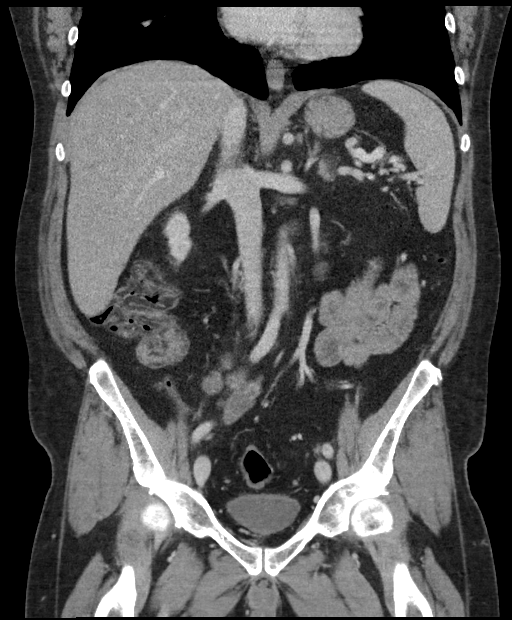

[15 of 46 positions shown; findings below may reference images not displayed]

FINDINGS: Lower chest: The visualized lung bases are grossly clear. The
visualized portions of the mediastinum are unremarkable.

Hepatobiliary: The liver is unremarkable in appearance. The patient
is status post cholecystectomy. The common bile duct remains normal
in caliber.

Pancreas: Several duodenal diverticula are noted at the pancreatic
head, containing air and fluid. The pancreas is otherwise
unremarkable.

Spleen: The spleen is unremarkable in appearance.

Adrenals/Urinary Tract: The adrenal glands are unremarkable in
appearance.

Nonobstructing bilateral renal stones are seen, measuring up to
cm on the left. Mild nonspecific perinephric stranding is noted
bilaterally. There is no evidence of hydronephrosis. No obstructing
ureteral stones are seen.

Stomach/Bowel: The stomach is unremarkable in appearance. The small
bowel is within normal limits. The appendix is normal in caliber,
without evidence of appendicitis.

Diffuse fatty infiltration of the wall of the ascending colon raises
concern for sequelae of chronic inflammation. Scattered
diverticulosis is noted along the proximal sigmoid colon, without
evidence of diverticulitis.

Vascular/Lymphatic: The abdominal aorta is unremarkable in
appearance. The inferior vena cava is grossly unremarkable. No
retroperitoneal lymphadenopathy is seen. No pelvic sidewall
lymphadenopathy is identified.

Reproductive: The bladder is mildly distended and grossly
unremarkable. The prostate is borderline prominent, measuring 4.8 cm
in transverse dimension.

Other: No additional soft tissue abnormalities are seen.

Musculoskeletal: No acute osseous abnormalities are identified. The
visualized musculature is unremarkable in appearance.
IMPRESSION: 1. No acute abnormality seen to explain the patient's symptoms.
2. Diffuse fatty infiltration of the wall of the ascending colon
raises concern for sequelae of chronic inflammation. Scattered
diverticulosis along the proximal sigmoid colon, without evidence of
diverticulitis.
3. Nonobstructing bilateral renal stones, measuring up to 1.3 cm on
the left.
4. Duodenal diverticula noted at the pancreatic head. Pancreas
unremarkable in appearance.

## 2018-12-07 IMAGING — CR DG CHEST 2V
1 series · 2 of 2 positions shown · non-contrast
Comparison: 10/03/2017

CLINICAL DATA: Shortness of breath

EXAM:
CHEST - 2 VIEW

[Series 1: dg chest 2 view · 0.14mm/px · 2 of 2 slices shown]
[im 1/2]
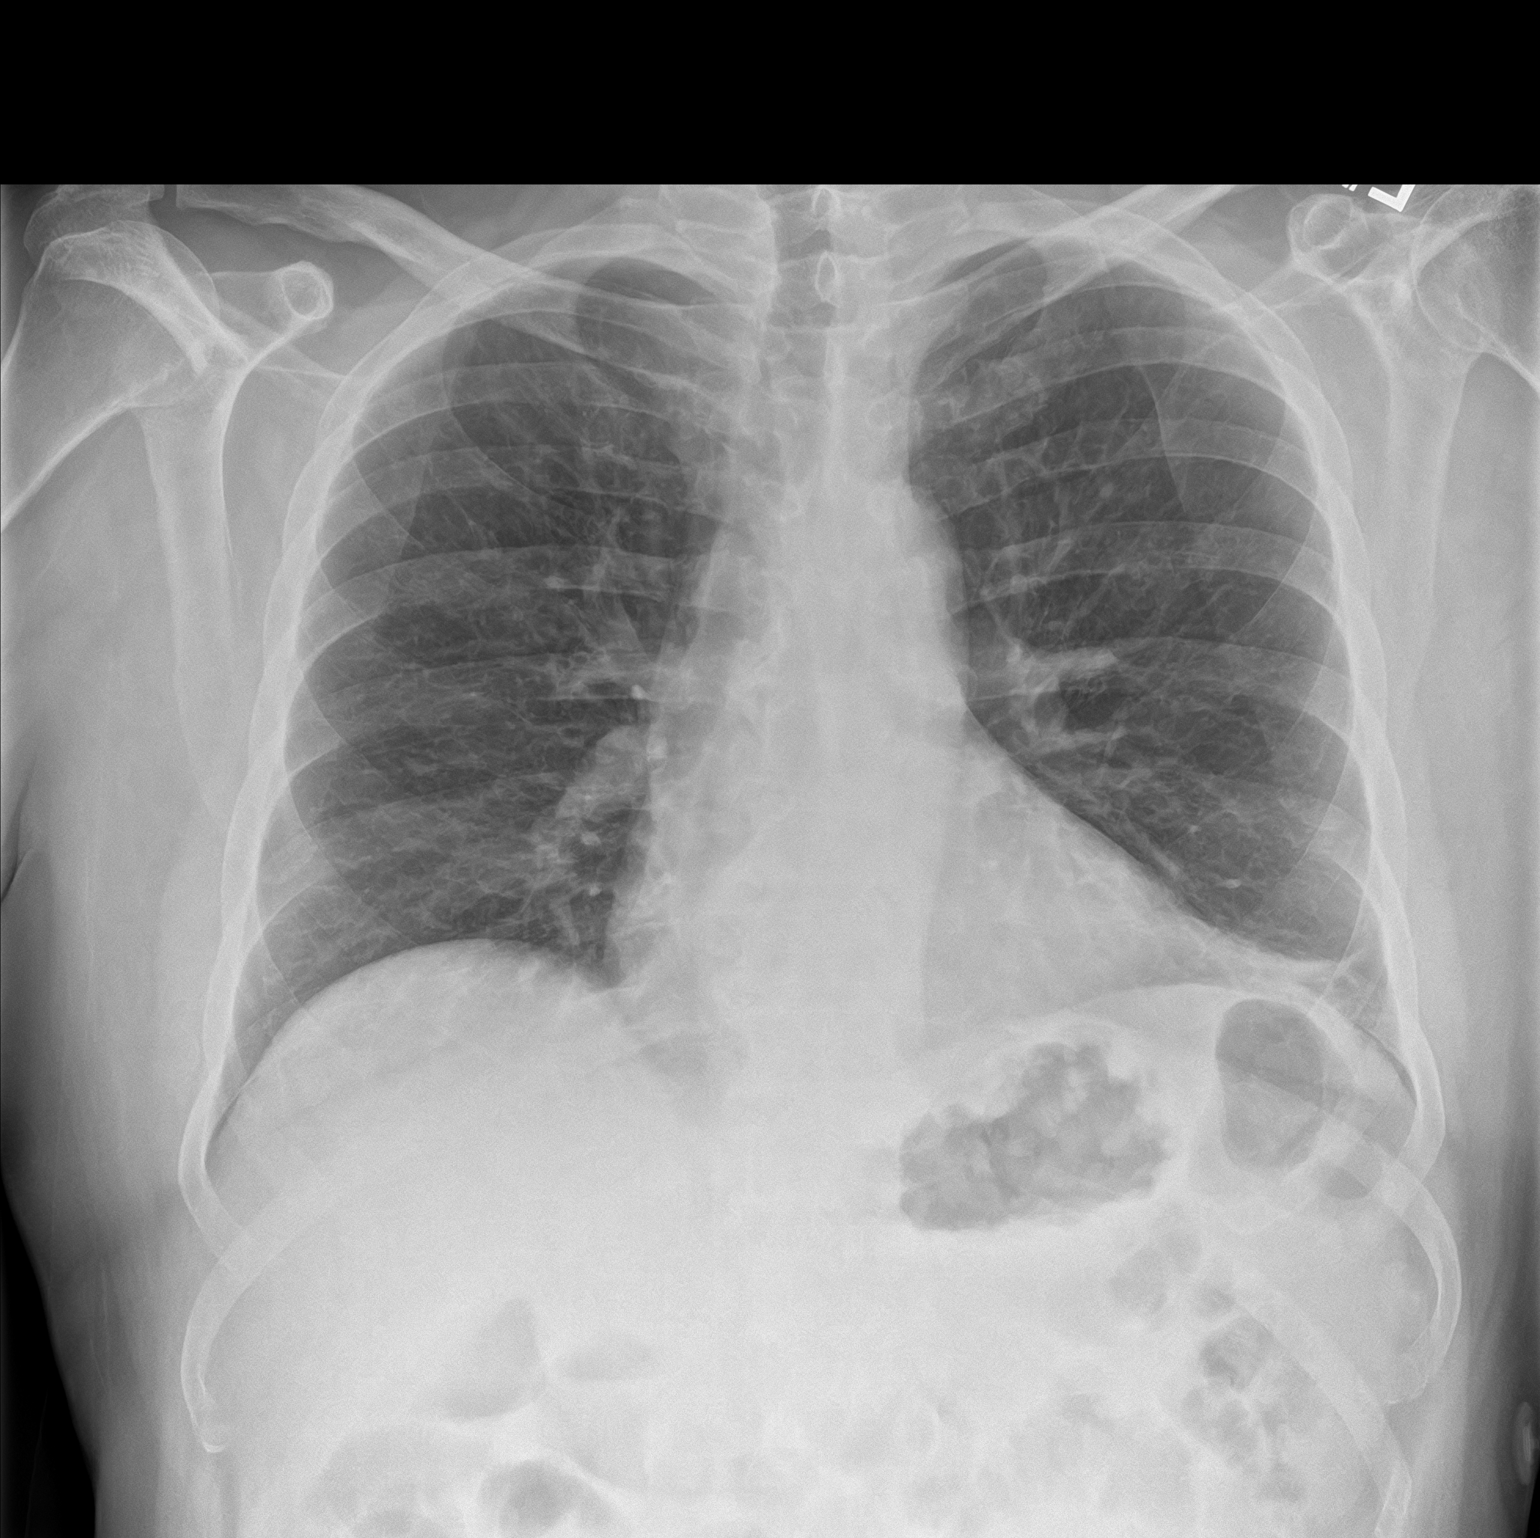
[im 2/2]
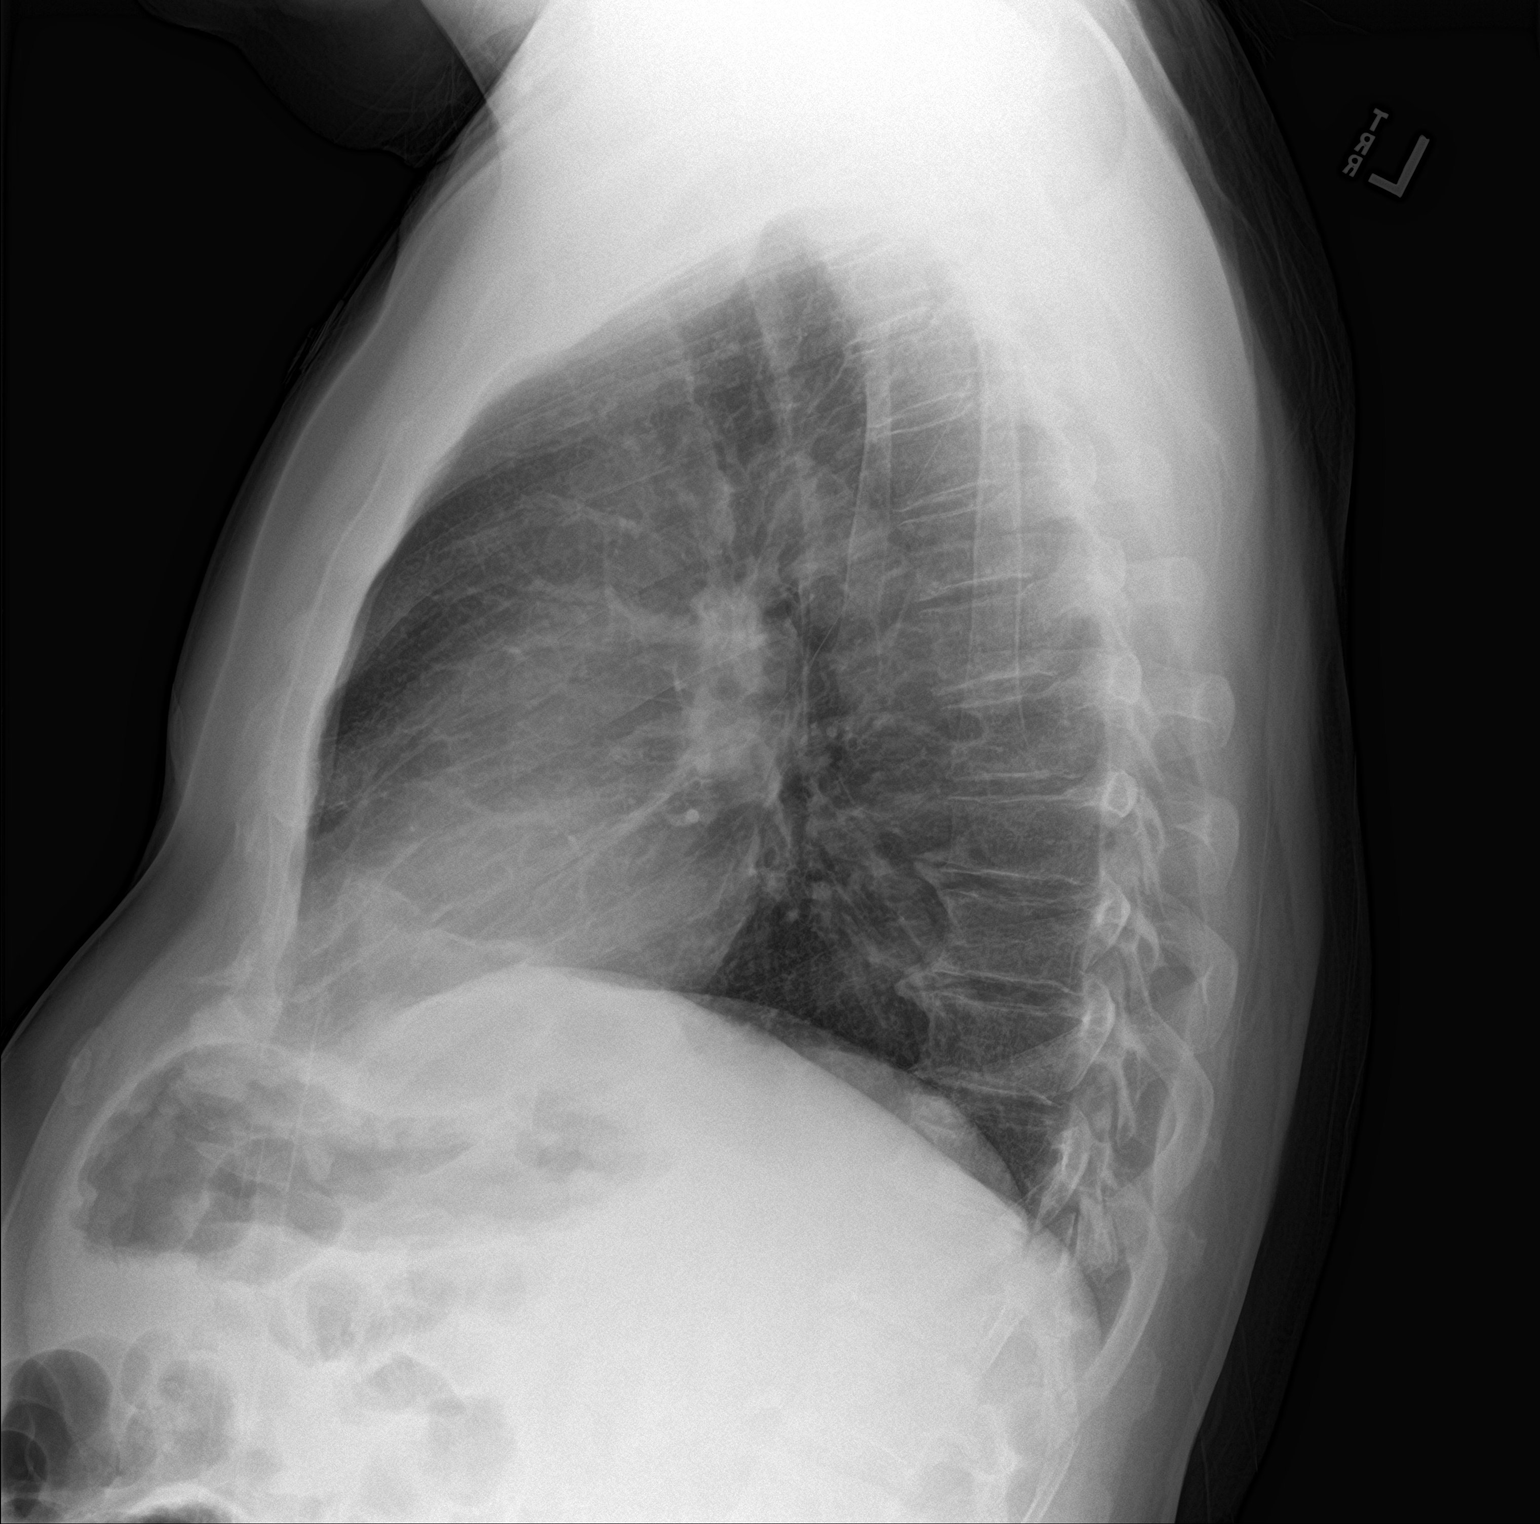

[2 of 2 positions shown; findings below may reference images not displayed]

FINDINGS: Atelectasis at the lingula. No acute consolidation or effusion.
Normal heart size. No pneumothorax. Mild degenerative changes of the
spine.
IMPRESSION: No active cardiopulmonary disease.  Mild atelectasis at the lingula.

## 2018-12-31 IMAGING — CR DG CHEST 2V
2 series · 2 of 2 positions shown · non-contrast
Comparison: Chest radiograph performed 03/08/2018

CLINICAL DATA: Status post fall. Right-sided chest pain. Initial
encounter.

EXAM:
CHEST - 2 VIEW

[chest lat]
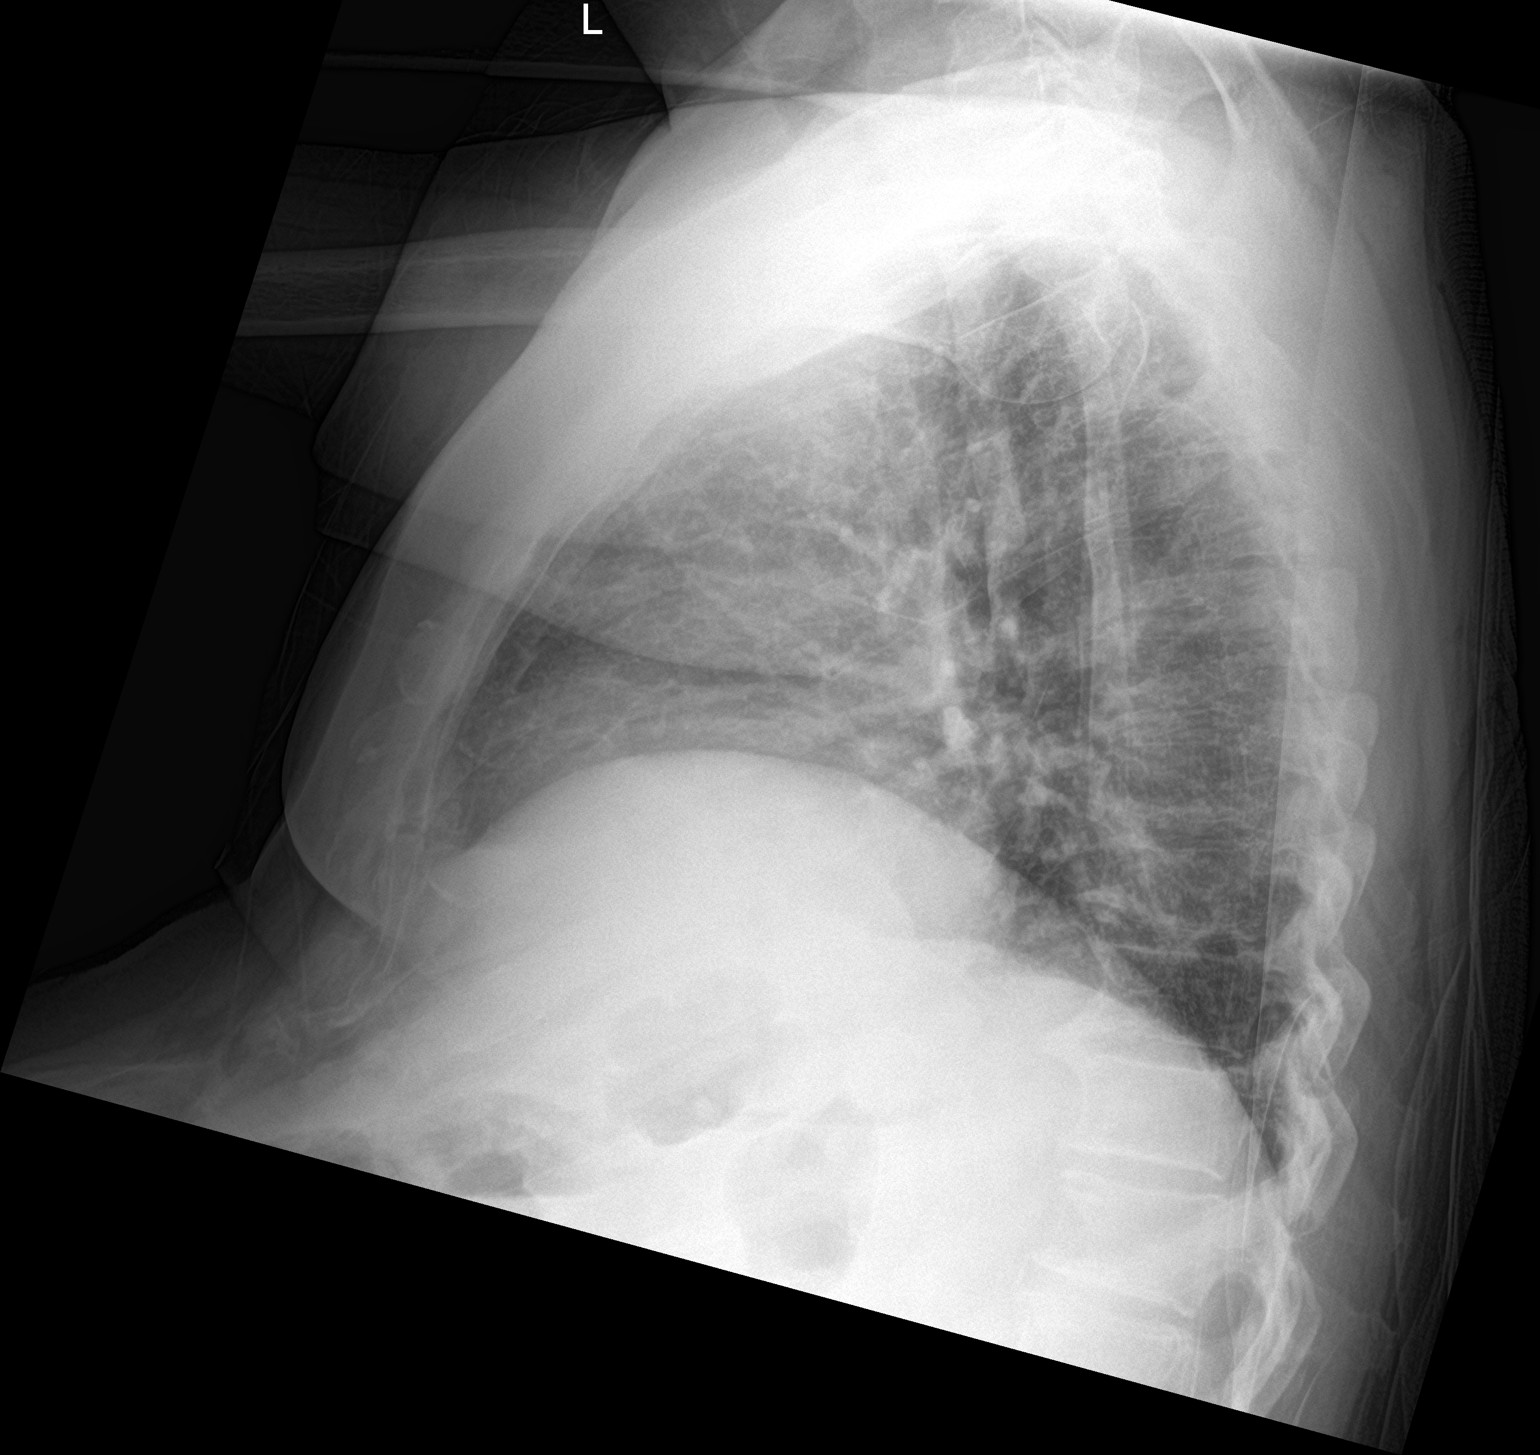

[chest ap]
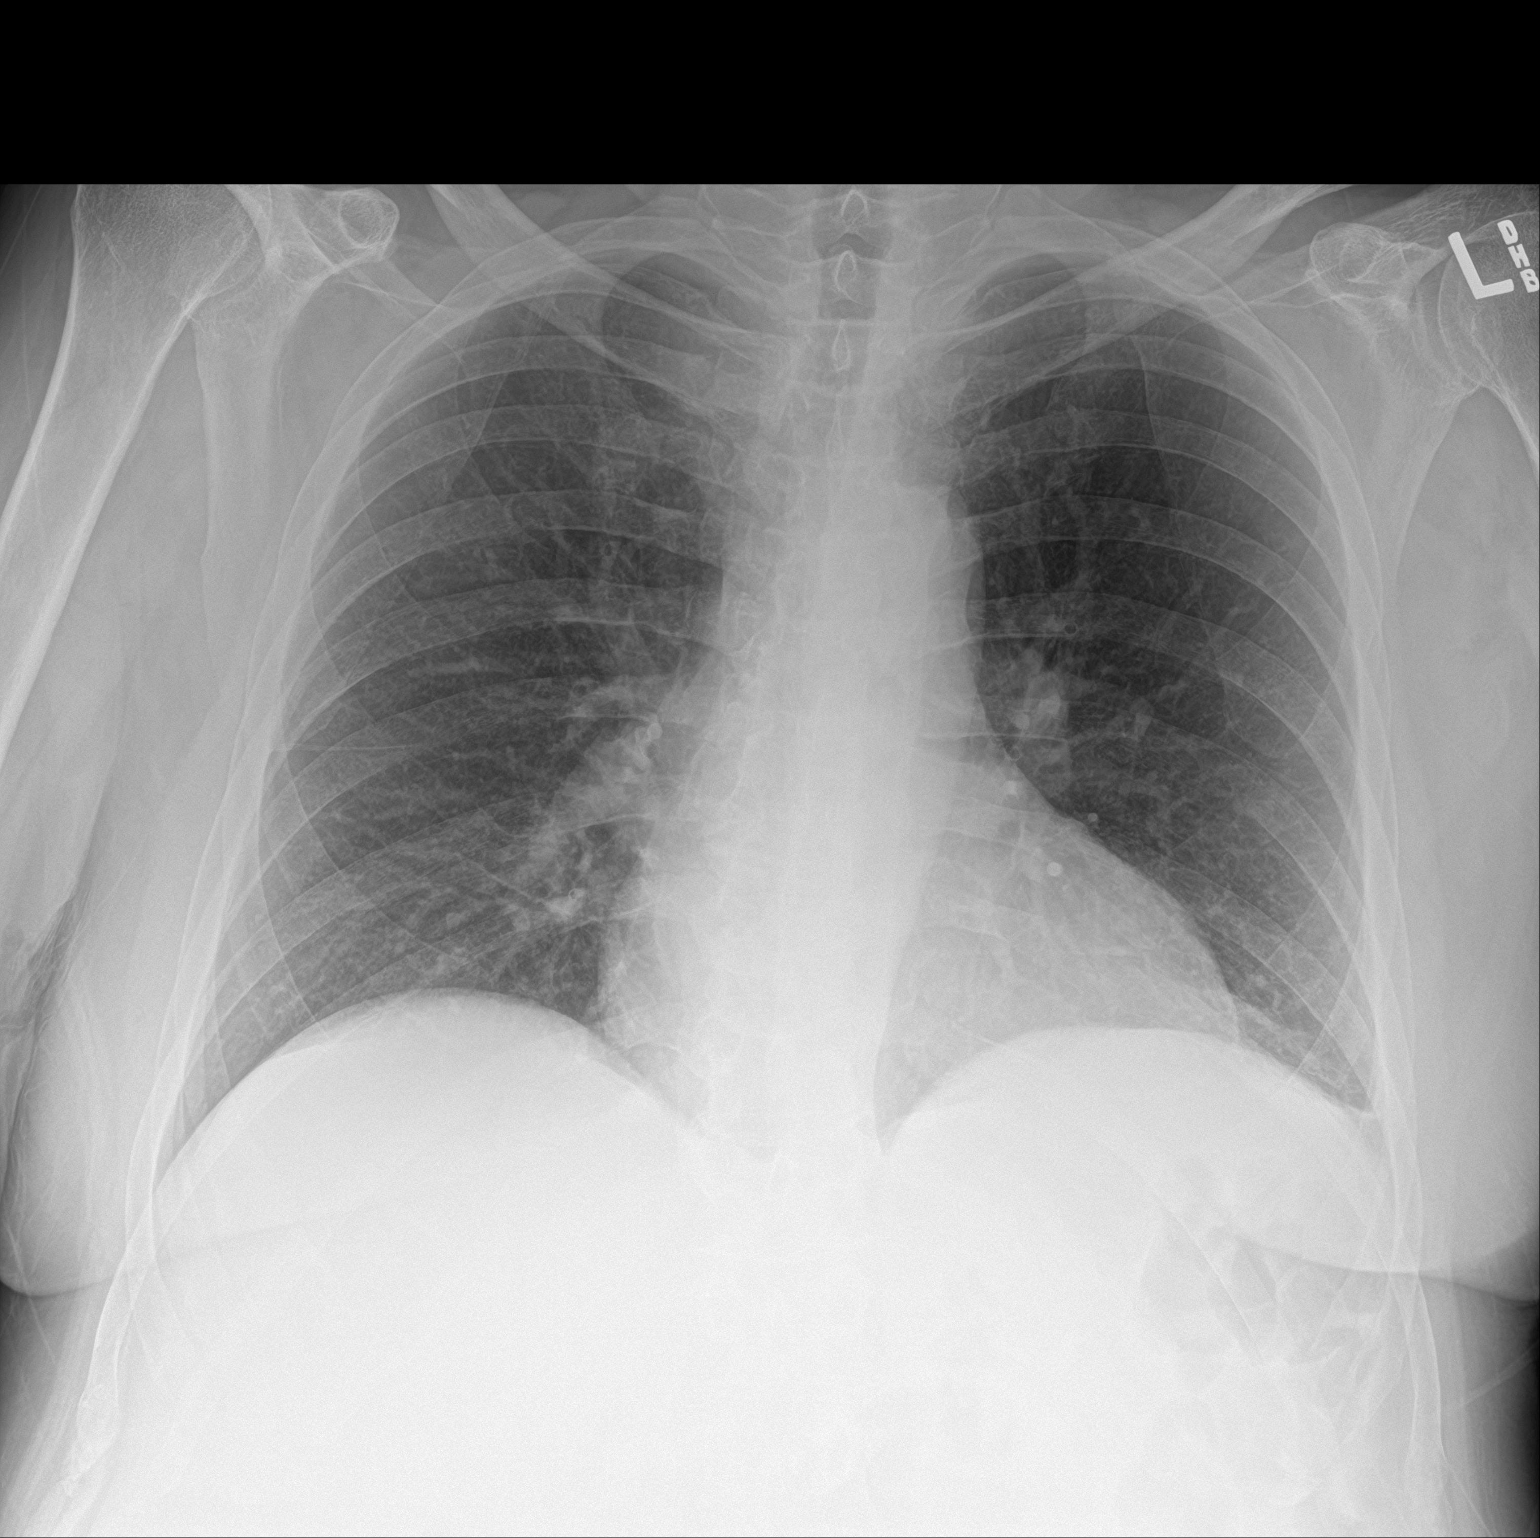

[2 of 2 positions shown; findings below may reference images not displayed]

FINDINGS: The lungs are well-aerated. Minimal left basilar atelectasis is
noted. There is no evidence of pleural effusion or pneumothorax.

The heart is normal in size; the mediastinal contour is within
normal limits. No acute osseous abnormalities are seen.
IMPRESSION: Minimal left basilar atelectasis noted; lungs otherwise clear. No
displaced rib fracture seen.

## 2019-01-08 ENCOUNTER — Other Ambulatory Visit: Payer: Self-pay

## 2019-01-08 DIAGNOSIS — Z87891 Personal history of nicotine dependence: Secondary | ICD-10-CM | POA: Insufficient documentation

## 2019-01-08 DIAGNOSIS — N1330 Unspecified hydronephrosis: Secondary | ICD-10-CM | POA: Insufficient documentation

## 2019-01-08 DIAGNOSIS — J45909 Unspecified asthma, uncomplicated: Secondary | ICD-10-CM | POA: Insufficient documentation

## 2019-01-08 DIAGNOSIS — I10 Essential (primary) hypertension: Secondary | ICD-10-CM | POA: Insufficient documentation

## 2019-01-08 DIAGNOSIS — Z79899 Other long term (current) drug therapy: Secondary | ICD-10-CM | POA: Insufficient documentation

## 2019-01-08 DIAGNOSIS — N201 Calculus of ureter: Secondary | ICD-10-CM | POA: Insufficient documentation

## 2019-01-08 LAB — CBC
HCT: 40.7 % (ref 39.0–52.0)
Hemoglobin: 12.8 g/dL — ABNORMAL LOW (ref 13.0–17.0)
MCH: 26 pg (ref 26.0–34.0)
MCHC: 31.4 g/dL (ref 30.0–36.0)
MCV: 82.6 fL (ref 80.0–100.0)
Platelets: 204 10*3/uL (ref 150–400)
RBC: 4.93 MIL/uL (ref 4.22–5.81)
RDW: 18 % — ABNORMAL HIGH (ref 11.5–15.5)
WBC: 6.3 10*3/uL (ref 4.0–10.5)
nRBC: 0 % (ref 0.0–0.2)

## 2019-01-08 LAB — COMPREHENSIVE METABOLIC PANEL
ALT: 19 U/L (ref 0–44)
AST: 24 U/L (ref 15–41)
Albumin: 4 g/dL (ref 3.5–5.0)
Alkaline Phosphatase: 65 U/L (ref 38–126)
Anion gap: 10 (ref 5–15)
BUN: 15 mg/dL (ref 6–20)
CO2: 26 mmol/L (ref 22–32)
Calcium: 8.6 mg/dL — ABNORMAL LOW (ref 8.9–10.3)
Chloride: 103 mmol/L (ref 98–111)
Creatinine, Ser: 1.33 mg/dL — ABNORMAL HIGH (ref 0.61–1.24)
GFR calc Af Amer: >60 mL/min (ref >60)
GFR calc non Af Amer: 58 mL/min — ABNORMAL LOW (ref >60)
Glucose, Bld: 122 mg/dL — ABNORMAL HIGH (ref 70–99)
Potassium: 3.5 mmol/L (ref 3.5–5.1)
Sodium: 139 mmol/L (ref 135–145)
Total Bilirubin: 1.7 mg/dL — ABNORMAL HIGH (ref 0.3–1.2)
Total Protein: 6.8 g/dL (ref 6.5–8.1)

## 2019-01-08 LAB — LIPASE, BLOOD: Lipase: 34 U/L (ref 11–51)

## 2019-01-08 LAB — URINALYSIS, COMPLETE (UACMP) WITH MICROSCOPIC
Bilirubin Urine: NEGATIVE
Glucose, UA: NEGATIVE mg/dL
Ketones, ur: NEGATIVE mg/dL
Nitrite: NEGATIVE
Protein, ur: 100 mg/dL — AB
RBC / HPF: >50 RBC/hpf — ABNORMAL HIGH (ref 0–5)
Specific Gravity, Urine: 1.024 (ref 1.005–1.030)
pH: 6 (ref 5.0–8.0)

## 2019-01-08 MED ORDER — SODIUM CHLORIDE 0.9% FLUSH: 3.0000 mL | Freq: Once | INTRAVENOUS | Status: DC

## 2019-01-08 NOTE — ED Triage Notes (Signed)
Pt comes via POV from home with c/o left sided abdominal pain. Pt states this started couple hours ago.  Pt denies any N.V.D.  Pt states 10/10 pain. Pt states he tried to use bathroom and stand but it didn't help.

## 2019-01-09 ENCOUNTER — Telehealth: Payer: Self-pay | Admitting: Urology

## 2019-01-09 ENCOUNTER — Emergency Department: Payer: Self-pay

## 2019-01-09 ENCOUNTER — Emergency Department
Admission: EM | Admit: 2019-01-09 | Discharge: 2019-01-09 | Disposition: A | Discharge status: Home / Self Care | Payer: Self-pay | Attending: Emergency Medicine | Admitting: Emergency Medicine

## 2019-01-09 DIAGNOSIS — N1330 Unspecified hydronephrosis: Secondary | ICD-10-CM

## 2019-01-09 DIAGNOSIS — N201 Calculus of ureter: Secondary | ICD-10-CM

## 2019-01-09 MED ORDER — KETOROLAC TROMETHAMINE 30 MG/ML IJ SOLN
30.0000 mg | Freq: Once | INTRAMUSCULAR | Status: AC
  Administered 2019-01-09: 30 mg via INTRAMUSCULAR
  Filled 2019-01-09: qty 1

## 2019-01-09 MED ORDER — DOCUSATE SODIUM 100 MG PO CAPS: ORAL_CAPSULE | ORAL | 0 refills | Status: DC

## 2019-01-09 MED ORDER — OXYCODONE-ACETAMINOPHEN 5-325 MG PO TABS: 2.0000 | ORAL_TABLET | Freq: Four times a day (QID) | ORAL | 0 refills | Status: DC | PRN

## 2019-01-09 MED ORDER — OXYCODONE-ACETAMINOPHEN 5-325 MG PO TABS
2.0000 | ORAL_TABLET | Freq: Once | ORAL | Status: AC
  Administered 2019-01-09: 2 via ORAL
  Filled 2019-01-09: qty 2

## 2019-01-09 MED ORDER — ONDANSETRON 4 MG PO TBDP: ORAL_TABLET | ORAL | 0 refills | Status: DC

## 2019-01-09 MED ORDER — TAMSULOSIN HCL 0.4 MG PO CAPS: ORAL_CAPSULE | ORAL | 0 refills | Status: DC

## 2019-01-09 NOTE — Telephone Encounter (Signed)
-----   Message from Billey Co, MD sent at 01/09/2019  7:14 AM EST ----- Regarding: clinic overbook Please overbook for my clinic Tuesday 2/25, ideally in the morning. New stone patient in ED over the weekend. Thanks  Nickolas Madrid, MD 01/09/2019

## 2019-01-09 NOTE — ED Notes (Signed)
No peripheral IV placed this visit.   Discharge instructions reviewed with patient. Questions fielded by this RN. Patient verbalizes understanding of instructions. Patient discharged home in stable condition per forbach. No acute distress noted at time of discharge.   

## 2019-01-09 NOTE — ED Notes (Signed)
Pt c/o left lower quad pain, sharp shooting since 2 pm yesterday, worse with palpation, 9/10

## 2019-01-09 NOTE — ED Notes (Signed)
Pt denies ETOH in the last 24 hours

## 2019-01-09 NOTE — ED Provider Notes (Signed)
[facility] Emergency Department Provider Note  ____________________________________________   First MD Initiated Contact with Patient 01/09/19 0149     (approximate)  I have reviewed the triage vital signs and the nursing notes.   HISTORY  Chief Complaint Abdominal Pain    HPI Miguel [name] is a 58 y.o. male with medical history as listed below who presents for evaluation of acute onset and severe sharp stabbing left-sided flank pain.  He has had some blood in his urine.  He has a history of kidney stones but this hit him more acutely and severely than usual.  He reports that started about 2:00 PM and has been intermittent but persistent and then it got worse over the last few hours before he came to the ED.  He had some nausea and vomited once in the emergency department.  The symptoms currently are mild but they come and go.  Nothing in particular makes it better or worse.  When they are present he has a difficult time finding a position of comfort.  He has a history of alcohol abuse but reports he has not been drinking for the last 2 weeks.  He is not having any hallucinations and is not having any shaking or tremors.      Past Medical History:  Diagnosis Date  . Alcohol abuse   . Asthma   . GERD (gastroesophageal reflux disease)   . Gout   . Hypertension   . Kidney stone   . OCD (obsessive compulsive disorder)   . Renal colic     Patient Active Problem List   Diagnosis Date Noted  . Breast lump or mass 10/04/2018  . Sepsis secondary to UTI ([place]) 09/14/2018  . Asthma 07/07/2018  . GERD (gastroesophageal reflux disease) 07/07/2018  . OCD (obsessive compulsive disorder) 07/07/2018  . Self-inflicted laceration of wrist 03/10/2018  . Leg hematoma 12/25/2017  . Suicide and self-inflicted injury by cutting and piercing instrument ([place]) 03/10/2017  . Severe recurrent major 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 ([place]) 03/09/2017  . Substance induced  mood disorder ([place]) 08/15/2016  . Involuntary commitment 08/15/2016  . Alcohol use disorder, severe, dependence ([place]) 02/05/2016  . Hypertension 12/05/2015  . Tachycardia 12/05/2015  . Gout 11/13/2015  . Chronic back pain 05/02/2015    Past Surgical History:  Procedure Laterality Date  . CHOLECYSTECTOMY  2012    Prior to Admission medications   Medication Sig Start Date End Date Taking? Authorizing Provider  albuterol (VENTOLIN HFA) 108 (90 Base) MCG/ACT inhaler Inhale 2 puffs into the lungs every 4 (four) hours as needed. 09/01/18  Yes Tukov-Yual, Arlyss Gandy, NP  allopurinol (ZYLOPRIM) 300 MG tablet Take 1 tablet (300 mg total) by mouth daily. 09/01/18  Yes Tukov-Yual, Magdalene S, NP  Fluticasone-Salmeterol (ADVAIR DISKUS) 100-50 MCG/DOSE AEPB Inhale 1 puff into the lungs 2 (two) times daily. 09/01/18  Yes Tukov-Yual, Arlyss Gandy, NP  folic acid (FOLVITE) 1 MG tablet Take 1 tablet (1 mg total) by mouth daily. 09/01/18  Yes Tukov-Yual, Magdalene S, NP  lisinopril (PRINIVIL,ZESTRIL) 20 MG tablet Take 0.5 tablets (10 mg total) by mouth daily. Patient taking differently: Take 20 mg by mouth daily.  10/04/18  Yes Iloabachie, Chioma E, NP  Multiple Vitamins-Minerals (MULTIVITAMIN WITH MINERALS) tablet Take 1 tablet by mouth daily. 09/01/18  Yes Tukov-Yual, Arlyss Gandy, NP  docusate sodium (COLACE) 100 MG capsule Take 1 tablet once or twice daily as needed for constipation while taking narcotic pain medicine 01/09/19   Karma Greaser,  Tommi Rumps, MD  ondansetron (ZOFRAN ODT) 4 MG disintegrating tablet Allow 1-2 tablets to dissolve in your mouth every 8 hours as needed for nausea/vomiting 01/09/19   Hinda Kehr, MD  oxyCODONE-acetaminophen (PERCOCET) 5-325 MG tablet Take 2 tablets by mouth every 6 (six) hours as needed for severe pain. 01/09/19   Hinda Kehr, MD  tamsulosin (FLOMAX) 0.4 MG CAPS capsule Take 1 tablet by mouth daily until you pass the kidney stone or no longer have symptoms 01/09/19   Hinda Kehr, MD    Allergies Patient has no known allergies.  Family History  Problem Relation Age of Onset  . Alcohol abuse Father   . Breast cancer Mother 36    Social History Social History   Tobacco Use  . Smoking status: Former Research scientist (life sciences)  . Smokeless tobacco: Never Used  . Tobacco comment: quit 30 years ago  Substance Use Topics  . Alcohol use: Yes    Alcohol/week: 7.0 standard drinks    Types: 7 Glasses of wine per week    Comment: wine earlier today  . Drug use: No    Review of Systems Constitutional: No fever/chills Eyes: No visual changes. ENT: No sore throat. Cardiovascular: Denies chest pain. Respiratory: Denies shortness of breath. Gastrointestinal: Left flank pain with nausea and one episode of vomiting.  No diarrhea.  No constipation. Genitourinary: Mild dysuria with some hematuria. Musculoskeletal: Left flank pain. Integumentary: Negative for rash. Neurological: Negative for headaches, focal weakness or numbness.   ____________________________________________   PHYSICAL EXAM:  VITAL SIGNS: ED Triage Vitals  Enc Vitals Group     BP 01/08/19 1849 (!) 210/110     Pulse Rate 01/08/19 1833 82     Resp 01/08/19 1833 18     Temp 01/08/19 1833 98.8 F (37.1 C)     Temp Source 01/08/19 1833 Oral     SpO2 01/08/19 1833 98 %     Weight 01/08/19 1846 86.6 kg (191 lb)     Height 01/08/19 1846 1.829 m (6')     Head Circumference --      Peak Flow --      Pain Score 01/08/19 1846 10     Pain Loc --      Pain Edu? --      Excl. in [place]? --     Constitutional: Alert and oriented.  Appears uncomfortable but is not in severe distress. Eyes: Conjunctivae are normal.  Head: Atraumatic. Nose: No congestion/rhinnorhea. Mouth/Throat: Mucous membranes are moist. Neck: No stridor.  No meningeal signs.   Cardiovascular: Normal rate, regular rhythm. Good peripheral circulation. Grossly normal heart sounds. Respiratory: Normal respiratory effort.  No retractions. Lungs  CTAB. Gastrointestinal: Soft and nontender. No distention.  Musculoskeletal: Severe left-sided CVA tenderness to percussion.  No lower extremity tenderness nor edema. No gross deformities of extremities. Neurologic:  Normal speech and language. No gross focal neurologic deficits are appreciated.  No tremors. Skin:  Skin is warm, dry and intact. No rash noted. Psychiatric: Mood and affect are normal. Speech and behavior are normal.  ____________________________________________   LABS (all labs ordered are listed, but only abnormal results are displayed)  Labs Reviewed  COMPREHENSIVE METABOLIC PANEL - Abnormal; Notable for the following components:      Result Value   Glucose, Bld 122 (*)    Creatinine, Ser 1.33 (*)    Calcium 8.6 (*)    Total Bilirubin 1.7 (*)    GFR calc non Af Amer 58 (*)    All other components  within normal limits  CBC - Abnormal; Notable for the following components:   Hemoglobin 12.8 (*)    RDW 18.0 (*)    All other components within normal limits  URINALYSIS, COMPLETE (UACMP) WITH MICROSCOPIC - Abnormal; Notable for the following components:   Color, Urine AMBER (*)    APPearance CLEAR (*)    Hgb urine dipstick MODERATE (*)    Protein, ur 100 (*)    Leukocytes,Ua TRACE (*)    RBC / HPF >50 (*)    Bacteria, UA RARE (*)    All other components within normal limits  LIPASE, BLOOD   ____________________________________________  EKG  ED ECG REPORT I, Hinda Kehr, the attending physician, personally viewed and interpreted this ECG.  Date: 01/08/2019 EKG Time: 18: 43 Rate: 84 Rhythm: normal sinus rhythm QRS Axis: normal Intervals: normal ST/T Wave abnormalities: Non-specific ST segment / T-wave changes, but no clear evidence of acute ischemia. Narrative Interpretation: no definitive evidence of acute ischemia; does not meet STEMI criteria.   ____________________________________________  RADIOLOGY I, Hinda Kehr, personally viewed and evaluated  these images (plain radiographs) as part of my medical decision making, as well as reviewing the written report by the radiologist.  ED MD interpretation: 16 mm left UPJ stone with hydronephrosis.  Abdominal x-ray also demonstrates the stone.  Official radiology report(s): Dg Abdomen 1 View  Result Date: 01/09/2019 CLINICAL DATA:  Left-sided stone EXAM: ABDOMEN - 1 VIEW COMPARISON:  CT abdomen and pelvis 01/09/2019 FINDINGS: Visualization of the abdominal contents is limited due to bowel gas overlying. An ovoid stone is demonstrated in the mid left abdomen at the level of L3-4 corresponding to stones seen at CT. On radiograph, the stone measures 5 x 10 mm in diameter. No other radiopaque stones are seen. Degenerative changes in the spine and hips. IMPRESSION: 5 x 10 mm stone projected over the mid left abdomen, corresponding to ureteral stone seen at previous CT. Electronically Signed   By: Lucienne Capers M.D.   On: 01/09/2019 03:10   Ct Renal Stone Study  Result Date: 01/09/2019 CLINICAL DATA:  58 year old male with left flank pain. Concern for kidney stone. EXAM: CT ABDOMEN AND PELVIS WITHOUT CONTRAST TECHNIQUE: Multidetector CT imaging of the abdomen and pelvis was performed following the standard protocol without IV contrast. COMPARISON:  CT of the abdomen pelvis dated 11/20/2018 FINDINGS: Evaluation of this exam is limited in the absence of intravenous contrast. Lower chest: The visualized lung bases are clear. No intra-abdominal free air or free fluid. Hepatobiliary: The liver is unremarkable. No intrahepatic biliary ductal dilatation. Cholecystectomy. Pancreas: Unremarkable. No pancreatic ductal dilatation or surrounding inflammatory changes. Spleen: Normal in size without focal abnormality. Adrenals/Urinary Tract: The adrenal glands are unremarkable. There is an obstructing stone in the proximal left ureter measuring up to 16 mm in greatest length. There is mild left hydronephrosis. Multiple  additional nonobstructing bilateral renal calculi measure up to 5 mm. There is no hydronephrosis on the right. The right ureter appears unremarkable. The urinary bladder is collapsed. Stomach/Bowel: There is sigmoid diverticulosis without active inflammatory changes. Multiple duodenal diverticula measure up to 3.5 cm in the second portion of the duodenum. There is no bowel obstruction. The appendix is normal. Vascular/Lymphatic: Mild aortoiliac atherosclerotic disease. No portal venous gas. There is no adenopathy. Reproductive: Mildly enlarged prostate gland measuring 5 cm in transverse diameter. Other: None Musculoskeletal: No acute or significant osseous findings. IMPRESSION: 1. A 16 mm obstructing stone in the proximal left ureter with mild left hydronephrosis.  2. Multiple nonobstructing bilateral renal calculi measuring up to 5 mm. No hydronephrosis on the right. 3. Colonic diverticulosis. No bowel obstruction or active inflammation. Normal appendix. Electronically Signed   By: Anner Crete M.D.   On: 01/09/2019 02:02    ____________________________________________   PROCEDURES   Procedure(s) performed (including Critical Care):  Procedures   ____________________________________________   INITIAL IMPRESSION / MDM / [place] / ED COURSE  As part of my medical decision making, I reviewed the following data within the [facility] notes reviewed and incorporated, Labs reviewed , EKG interpreted , Old chart reviewed, Radiograph reviewed , Discussed with urologist, Notes from prior ED visits and [place] Controlled Substance Database       Differential diagnosis includes, but is not limited to, ureteral stone/renal colic, pyelonephritis, UTI, appendicitis or other intra-abdominal infection.  The patient is generally well-appearing and not in acute distress although he has episodes of severe pain.  His lab work is generally reassuring and there is hematuria but no  sign of acute infection.  Renal function is normal.  Vital signs are stable and he is afebrile.  No sign of urinary tract infection but he does have hematuria.  I ordered a CT abdomen pelvis renal stone protocol given a history of stones in his kidneys and he has a large, 16 mm stone in the left ureteropelvic junction with some hydronephrosis.  I will call urology to discuss whether or not they feel he needs emergent intervention but the patient is not toxic appearing and is not in severe distress and he may be appropriate for discharge and outpatient follow-up.  Clinical Course as of Jan 09 503  Mon Jan 09, 2019  0227 I discussed the case by phone with Dr. Diamantina Providence with urology.  He said that if we can control the patient's pain is should be appropriate for discharge and he will see the patient on Tuesday in clinic to discuss lithotripsy and other options.  I updated the patient regarding the plan and he understands and agrees.  I have ordered 30 mg Toradol intramuscular and 2 Percocet by mouth.  He does not appear to be in much distress at this time.  I am sending him for a 1 view abdominal x-ray as per the request from Dr. Diamantina Providence to help plan for their intervention.   [CF]  [phone] The patient's pain is surprisingly well controlled and he is in no distress.  I will discharge him for close outpatient follow-up as previously discussed.  I gave my usual and customary return precautions.  He remains not tachycardic and afebrile.   [CF]    Clinical Course User Index [CF] Hinda Kehr, MD    ____________________________________________  FINAL CLINICAL IMPRESSION(S) / ED DIAGNOSES  Final diagnoses:  Left ureteral stone  Hydronephrosis, unspecified hydronephrosis type     MEDICATIONS GIVEN DURING THIS VISIT:  Medications  sodium chloride flush (NS) 0.9 % injection 3 mL (has no administration in time range)  ketorolac (TORADOL) 30 MG/ML injection 30 mg (30 mg Intramuscular Given 01/09/19 0257)    oxyCODONE-acetaminophen (PERCOCET/ROXICET) 5-325 MG per tablet 2 tablet (2 tablets Oral Given 01/09/19 0256)     ED Discharge Orders         Ordered    oxyCODONE-acetaminophen (PERCOCET) 5-325 MG tablet  Every 6 hours PRN,   Status:  Discontinued     01/09/19 0501    ondansetron (ZOFRAN ODT) 4 MG disintegrating tablet     01/09/19 0501  tamsulosin (FLOMAX) 0.4 MG CAPS capsule     01/09/19 0501    docusate sodium (COLACE) 100 MG capsule     01/09/19 0501    oxyCODONE-acetaminophen (PERCOCET) 5-325 MG tablet  Every 6 hours PRN     01/09/19 0502           Note:  This document was prepared using Dragon voice recognition software and may include unintentional dictation errors.   Hinda Kehr, MD 01/09/19 [phone]

## 2019-01-09 NOTE — Telephone Encounter (Signed)
App made  Patient is aware  Sharyn Lull

## 2019-01-09 NOTE — Discharge Instructions (Signed)
You have been seen in the Emergency Department (ED) today for pain that we believe based on your workup, is caused by kidney stones.  As we have discussed, please drink plenty of fluids.  Please make a follow up appointment with the physician(s) listed elsewhere in this documentation.  You may take pain medication as needed but ONLY as prescribed.  Please also take your prescribed Flomax daily.    Please see your doctor as soon as possible as stones may take 1-3 weeks to pass and you may require additional care or medications.  Do not drink alcohol, drive or participate in any other potentially dangerous activities while taking opiate pain medication as it may make you sleepy. Do not take this medication with any other sedating medications, either prescription or over-the-counter. If you were prescribed Percocet or Vicodin, do not take these with acetaminophen (Tylenol) as it is already contained within these medications.   Take Percocet as needed for severe pain.  This medication is an opiate (or narcotic) pain medication and can be habit forming.  Use it as little as possible to achieve adequate pain control.  Do not use or use it with extreme caution if you have a history of opiate abuse or dependence.  If you are on a pain contract with your primary care doctor or a pain specialist, be sure to let them know you were prescribed this medication today from the [facility] Emergency Department.  This medication is intended for your use only - do not give any to anyone else and keep it in a secure place where nobody else, especially children, have access to it.  It will also cause or worsen constipation, so you may want to consider taking an over-the-counter stool softener while you are taking this medication.  Return to the Emergency Department (ED) or call your doctor if you have any worsening pain, fever, painful urination, are unable to urinate, or develop other symptoms that concern you.

## 2019-01-10 ENCOUNTER — Ambulatory Visit (INDEPENDENT_AMBULATORY_CARE_PROVIDER_SITE_OTHER): Payer: Self-pay | Admitting: Urology

## 2019-01-10 ENCOUNTER — Encounter: Payer: Self-pay | Admitting: Urology

## 2019-01-10 ENCOUNTER — Telehealth: Payer: Self-pay | Admitting: Pharmacist

## 2019-01-10 VITALS — BP 127/73 | HR 105 | Ht 72.0 in | Wt 190.0 lb

## 2019-01-10 DIAGNOSIS — N201 Calculus of ureter: Secondary | ICD-10-CM

## 2019-01-10 MED ORDER — HYDROCODONE-ACETAMINOPHEN 5-325 MG PO TABS: 1.0000 | ORAL_TABLET | Freq: Four times a day (QID) | ORAL | 0 refills | Status: AC | PRN

## 2019-01-10 NOTE — Progress Notes (Signed)
01/10/2019 11:38 AM   [name] 10-27-1961 [id number]  CC: Left groin pain  HPI: I saw Mr. Ku in urology clinic today in consultation for left groin pain.  He is a 58 year old male with past medical history notable for alcohol abuse and hypertension who presented to the emergency department on 01/08/2019 with severe left groin pain.  CT scan at that time showed a 16 mm proximal ureteral stone with upstream hydronephrosis.  Urinalysis did not show any infection, and he was discharged home with oral pain control.  He reports ongoing intermittent severe left-sided flank pain, 10/10 at the worst.  Denies any chest pain, shortness of breath, fevers, or chills.  He is having nausea when he takes the Percocet pain medications.  He does have a history of nephrolithiasis and underwent shockwave lithotripsy approximately 20 years ago.  There are no aggravating or alleviating factors.  Severity is moderate to severe.  He denies any recent alcohol abuse or drug use.   PMH: Past Medical History:  Diagnosis Date  . Alcohol abuse   . Asthma   . GERD (gastroesophageal reflux disease)   . Gout   . Hypertension   . Kidney stone   . OCD (obsessive compulsive disorder)   . Renal colic     Surgical History: Past Surgical History:  Procedure Laterality Date  . CHOLECYSTECTOMY  2012    Allergies: No Known Allergies  Family History: Family History  Problem Relation Age of Onset  . Alcohol abuse Father   . Breast cancer Mother 50    Social History:  reports that he has quit smoking. He has never used smokeless tobacco. He reports current alcohol use of about 7.0 standard drinks of alcohol per week. He reports that he does not use drugs.  ROS: Please see flowsheet from today's date for complete review of systems.  Physical Exam: BP 127/73   Pulse (!) 105   Ht 6' (1.829 m)   Wt 190 lb (86.2 kg)   BMI 25.77 kg/m    Constitutional:  Alert and oriented, No acute  distress. Cardiovascular: Regular rate and rhythm Respiratory: Clear to auscultation bilaterally GI: Abdomen is soft, nontender, nondistended, no abdominal masses GU: Left CVA tenderness Lymph: No cervical or inguinal lymphadenopathy. Skin: No rashes, bruises or suspicious lesions. Neurologic: Grossly intact, no focal deficits, moving all 4 extremities. Psychiatric: Normal mood and affect.  Laboratory Data: Reviewed  Urinalysis 2/23 greater than 50 RBCs, 6-10 WBCs, rare bacteria, nitrite negative  Pertinent Imaging: I have personally reviewed the CT renal colic and KUB dated 2/70.  Left 16 mm proximal ureteral stone with upstream hydronephrosis, small renal stones on the left side.  Left ureteral stone 1050HU, 11 cm SSD.  stone clearly visible on KUB.  Assessment & Plan:   In summary, the patient is a 58 year old relatively healthy male with a 16 mm left proximal ureteral stone, and no clinical or laboratory signs of infection.  We discussed various treatment options for urolithiasis including observation with or without medical expulsive therapy, shockwave lithotripsy (SWL), ureteroscopy and laser lithotripsy with stent placement, and percutaneous nephrolithotomy.  We discussed that management is based on stone size, location, density, patient co-morbidities, and patient preference.   Stones <63mm in size have a >80% spontaneous passage rate. Data surrounding the use of tamsulosin for medical expulsive therapy is controversial, but meta analyses suggests it is most efficacious for distal stones between 5-30mm in size. Possible side effects include dizziness/lightheadedness, and retrograde ejaculation.  SWL  has a lower stone free rate in a single procedure, but also a lower complication rate compared to ureteroscopy and avoids a stent and associated stent related symptoms. Possible complications include renal hematoma, steinstrasse, and need for additional treatment.  Ureteroscopy with  laser lithotripsy and stent placement has a higher stone free rate than SWL in a single procedure, however increased complication rate including possible infection, ureteral injury, bleeding, and stent related morbidity. Common stent related symptoms include dysuria, urgency/frequency, and flank pain.  PCNL is the favored treatment for stones >2cm. It involves a small incision in the flank, with complete fragmentation of stones and removal. It has the highest stone free rate, but also the highest complication rate. Possible complications include bleeding, infection/sepsis, injury to surrounding organs including the pleura, and collecting system injury.   After an extensive discussion of the risks and benefits of the above treatment options, the patient would like to proceed with LEFT SWL.  I reinforced at length the lower stone free rate compared to ureteroscopy, however the patient prefers to avoid a ureteral stent as well as general anesthesia with endotracheal tube, as he has had complications with severe throat pain from intubation in the past.   Billey Co, [place] [address], [place], [place] [address] [phone]

## 2019-01-10 NOTE — Telephone Encounter (Signed)
01/10/2019 3:11:36 PM - Advair 100/50 & Ventolin refills  01/10/2019 Placed refills for Advair 100/50 & Ventolin online with [facility], to ship 01/23/2019, order# [id number].Miguel [name]

## 2019-01-10 NOTE — Patient Instructions (Signed)
Lithotripsy  Lithotripsy is a treatment that can sometimes help eliminate kidney stones and the pain that they cause. A form of lithotripsy, also known as extracorporeal shock wave lithotripsy, is a nonsurgical procedure that crushes a kidney stone with shock waves. These shock waves pass through your body and focus on the kidney stone. They cause the kidney stone to break up while it is still in the urinary tract. This makes it easier for the smaller pieces of stone to pass in the urine. Tell a health care provider about:  Any allergies you have.  All medicines you are taking, including vitamins, herbs, eye drops, creams, and over-the-counter medicines.  Any blood disorders you have.  Any surgeries you have had.  Any medical conditions you have.  Whether you are pregnant or may be pregnant.  Any problems you or family members have had with anesthetic medicines. What are the risks? Generally, this is a safe procedure. However, problems may occur, including:  Infection.  Bleeding of the kidney.  Bruising of the kidney or skin.  Scarring of the kidney, which can lead to: ? Increased blood pressure. ? Poor kidney function. ? Return (recurrence) of kidney stones.  Damage to other structures or organs, such as the liver, colon, spleen, or pancreas.  Blockage (obstruction) of the the tube that carries urine from the kidney to the bladder (ureter).  Failure of the kidney stone to break into pieces (fragments). What happens before the procedure? Staying hydrated Follow instructions from your health care provider about hydration, which may include:  Up to 2 hours before the procedure - you may continue to drink clear liquids, such as water, clear fruit juice, black coffee, and plain tea. Eating and drinking restrictions Follow instructions from your health care provider about eating and drinking, which may include:  8 hours before the procedure - stop eating heavy meals or foods  such as meat, fried foods, or fatty foods.  6 hours before the procedure - stop eating light meals or foods, such as toast or cereal.  6 hours before the procedure - stop drinking milk or drinks that contain milk.  2 hours before the procedure - stop drinking clear liquids. General instructions  Plan to have someone take you home from the hospital or clinic.  Ask your health care provider about: ? Changing or stopping your regular medicines. This is especially important if you are taking diabetes medicines or blood thinners. ? Taking medicines such as aspirin and ibuprofen. These medicines and other NSAIDs can thin your blood. Do not take these medicines for 7 days before your procedure if your health care provider instructs you not to.  You may have tests, such as: ? Blood tests. ? Urine tests. ? Imaging tests, such as a CT scan. What happens during the procedure?  To lower your risk of infection: ? Your health care team will wash or sanitize their hands. ? Your skin will be washed with soap.  An IV tube will be inserted into one of your veins. This tube will give you fluids and medicines.  You will be given one or more of the following: ? A medicine to help you relax (sedative). ? A medicine to make you fall asleep (general anesthetic).  A water-filled cushion may be placed behind your kidney or on your abdomen. In some cases you may be placed in a tub of lukewarm water.  Your body will be positioned in a way that makes it easy to target the kidney   stone.  A flexible tube with holes in it (stent) may be placed in the ureter. This will help keep urine flowing from the kidney if the fragments of the stone have been blocking the ureter.  An X-ray or ultrasound exam will be done to locate your stone.  Shock waves will be aimed at the stone. If you are awake, you may feel a tapping sensation as the shock waves pass through your body. The procedure may vary among health care  providers and hospitals. What happens after the procedure?  You may have an X-ray to see whether the procedure was able to break up the kidney stone and how much of the stone has passed. If large stone fragments remain after treatment, you may need to have a second procedure at a later time.  Your blood pressure, heart rate, breathing rate, and blood oxygen level will be monitored until the medicines you were given have worn off.  You may be given antibiotics or pain medicine as needed.  If a stent was placed in your ureter during surgery, it may stay in place for a few weeks.  You may need strain your urine to collect pieces of the kidney stone for testing.  You will need to drink plenty of water.  Do not drive for 24 hours if you were given a sedative. Summary  Lithotripsy is a treatment that can sometimes help eliminate kidney stones and the pain that they cause.  A form of lithotripsy, also known as extracorporeal shock wave lithotripsy, is a nonsurgical procedure that crushes a kidney stone with shock waves.  Generally, this is a safe procedure. However, problems may occur, including damage to the kidney or other organs, infection, or obstruction of the tube that carries urine from the kidney to the bladder (ureter).  When you go home, you will need to drink plenty of water. You may be asked to strain your urine to collect pieces of the kidney stone for testing. This information is not intended to replace advice given to you by your health care provider. Make sure you discuss any questions you have with your health care provider. Document Released: 10/30/2000 Document Revised: 11/18/2017 Document Reviewed: 09/23/2016 Elsevier Interactive Patient Education  2019 [employer]  

## 2019-01-12 ENCOUNTER — Encounter
Admission: RE | Disposition: A | Discharge status: Home / Self Care | Payer: Self-pay | Source: Home / Self Care | Attending: Urology

## 2019-01-12 ENCOUNTER — Ambulatory Visit: Payer: Self-pay

## 2019-01-12 ENCOUNTER — Other Ambulatory Visit: Payer: Self-pay

## 2019-01-12 ENCOUNTER — Ambulatory Visit
Admission: RE | Admit: 2019-01-12 | Discharge: 2019-01-12 | Disposition: A | Discharge status: Home / Self Care | Payer: Self-pay | Attending: Urology | Admitting: Urology

## 2019-01-12 ENCOUNTER — Encounter: Payer: Self-pay | Admitting: *Deleted

## 2019-01-12 DIAGNOSIS — Z87442 Personal history of urinary calculi: Secondary | ICD-10-CM | POA: Insufficient documentation

## 2019-01-12 DIAGNOSIS — N200 Calculus of kidney: Secondary | ICD-10-CM

## 2019-01-12 DIAGNOSIS — I10 Essential (primary) hypertension: Secondary | ICD-10-CM | POA: Insufficient documentation

## 2019-01-12 DIAGNOSIS — Z87891 Personal history of nicotine dependence: Secondary | ICD-10-CM | POA: Insufficient documentation

## 2019-01-12 DIAGNOSIS — N132 Hydronephrosis with renal and ureteral calculous obstruction: Secondary | ICD-10-CM | POA: Insufficient documentation

## 2019-01-12 HISTORY — PX: EXTRACORPOREAL SHOCK WAVE LITHOTRIPSY: SHX1557

## 2019-01-12 SURGERY — LITHOTRIPSY, ESWL
Anesthesia: Moderate Sedation | Laterality: Left

## 2019-01-12 MED ORDER — DIPHENHYDRAMINE HCL 25 MG PO CAPS
ORAL_CAPSULE | ORAL | Status: AC
  Administered 2019-01-12: 25 mg via ORAL
  Filled 2019-01-12: qty 1

## 2019-01-12 MED ORDER — TAMSULOSIN HCL 0.4 MG PO CAPS: 0.4000 mg | ORAL_CAPSULE | Freq: Every day | ORAL | 0 refills | Status: DC

## 2019-01-12 MED ORDER — SODIUM CHLORIDE 0.9 % IV SOLN
INTRAVENOUS | Status: DC
  Administered 2019-01-12: 08:00:00 via INTRAVENOUS

## 2019-01-12 MED ORDER — ONDANSETRON HCL 4 MG/2ML IJ SOLN
4.0000 mg | Freq: Once | INTRAMUSCULAR | Status: AC
  Administered 2019-01-12: 4 mg via INTRAVENOUS

## 2019-01-12 MED ORDER — DIAZEPAM 5 MG PO TABS
10.0000 mg | ORAL_TABLET | ORAL | Status: AC
  Administered 2019-01-12: 10 mg via ORAL

## 2019-01-12 MED ORDER — DIAZEPAM 5 MG PO TABS
ORAL_TABLET | ORAL | Status: AC
  Administered 2019-01-12: 10 mg via ORAL
  Filled 2019-01-12: qty 2

## 2019-01-12 MED ORDER — CIPROFLOXACIN HCL 500 MG PO TABS
ORAL_TABLET | ORAL | Status: AC
  Administered 2019-01-12: 500 mg via ORAL
  Filled 2019-01-12: qty 1

## 2019-01-12 MED ORDER — ONDANSETRON HCL 4 MG/2ML IJ SOLN
INTRAMUSCULAR | Status: AC
  Administered 2019-01-12: 4 mg via INTRAVENOUS
  Filled 2019-01-12: qty 2

## 2019-01-12 MED ORDER — HYDROCODONE-ACETAMINOPHEN 5-325 MG PO TABS: 1.0000 | ORAL_TABLET | ORAL | 0 refills | Status: AC | PRN

## 2019-01-12 MED ORDER — CIPROFLOXACIN HCL 500 MG PO TABS
500.0000 mg | ORAL_TABLET | ORAL | Status: AC
  Administered 2019-01-12: 500 mg via ORAL

## 2019-01-12 MED ORDER — DIPHENHYDRAMINE HCL 25 MG PO CAPS
25.0000 mg | ORAL_CAPSULE | ORAL | Status: AC
  Administered 2019-01-12: 25 mg via ORAL

## 2019-01-12 NOTE — OR Nursing (Signed)
Discharge instructions discussed with pt and aunt. Both voice understanding.

## 2019-01-12 NOTE — H&P (Signed)
UROLOGY H&P UPDATE  Agree with prior H&P dated 01/10/19.   Cardiac: RRR Lungs: CTA bilaterally  Laterality: LEFT Procedure: LEFT SWL  Urine: Urinalysis 2/23 nitrite negative, >50 RBCs, 6-10 WBCs, rare bacteria  Left 17mm symptomatic proximal ureteral stone, 1050HU, 11cm SSD.  Informed consent obtained, we specifically discussed the risks of bleeding, infection, post-operative pain, need for additional procedures, and steinstrasse.  Billey Co, MD 01/12/2019

## 2019-01-12 NOTE — Discharge Instructions (Signed)

## 2019-01-20 ENCOUNTER — Other Ambulatory Visit: Payer: Self-pay

## 2019-01-20 ENCOUNTER — Emergency Department: Payer: Self-pay

## 2019-01-20 ENCOUNTER — Encounter: Payer: Self-pay | Admitting: Emergency Medicine

## 2019-01-20 ENCOUNTER — Emergency Department
Admission: EM | Admit: 2019-01-20 | Discharge: 2019-01-20 | Disposition: A | Discharge status: Home / Self Care | Payer: Self-pay | Attending: Emergency Medicine | Admitting: Emergency Medicine

## 2019-01-20 DIAGNOSIS — F10920 Alcohol use, unspecified with intoxication, uncomplicated: Secondary | ICD-10-CM | POA: Insufficient documentation

## 2019-01-20 DIAGNOSIS — Z87891 Personal history of nicotine dependence: Secondary | ICD-10-CM | POA: Insufficient documentation

## 2019-01-20 DIAGNOSIS — I10 Essential (primary) hypertension: Secondary | ICD-10-CM | POA: Insufficient documentation

## 2019-01-20 DIAGNOSIS — R300 Dysuria: Secondary | ICD-10-CM | POA: Insufficient documentation

## 2019-01-20 DIAGNOSIS — J45909 Unspecified asthma, uncomplicated: Secondary | ICD-10-CM | POA: Insufficient documentation

## 2019-01-20 DIAGNOSIS — Z79899 Other long term (current) drug therapy: Secondary | ICD-10-CM | POA: Insufficient documentation

## 2019-01-20 DIAGNOSIS — R1032 Left lower quadrant pain: Secondary | ICD-10-CM | POA: Insufficient documentation

## 2019-01-20 LAB — COMPREHENSIVE METABOLIC PANEL
ALT: 13 U/L (ref 0–44)
AST: 16 U/L (ref 15–41)
Albumin: 3.7 g/dL (ref 3.5–5.0)
Alkaline Phosphatase: 60 U/L (ref 38–126)
Anion gap: 10 (ref 5–15)
BUN: 15 mg/dL (ref 6–20)
CO2: 27 mmol/L (ref 22–32)
Calcium: 8.7 mg/dL — ABNORMAL LOW (ref 8.9–10.3)
Chloride: 107 mmol/L (ref 98–111)
Creatinine, Ser: 0.92 mg/dL (ref 0.61–1.24)
GFR calc Af Amer: >60 mL/min (ref >60)
GFR calc non Af Amer: >60 mL/min (ref >60)
Glucose, Bld: 115 mg/dL — ABNORMAL HIGH (ref 70–99)
Potassium: 3.9 mmol/L (ref 3.5–5.1)
Sodium: 144 mmol/L (ref 135–145)
Total Bilirubin: 0.4 mg/dL (ref 0.3–1.2)
Total Protein: 6.8 g/dL (ref 6.5–8.1)

## 2019-01-20 LAB — URINE DRUG SCREEN, QUALITATIVE (ARMC ONLY)
Amphetamines, Ur Screen: NOT DETECTED
Barbiturates, Ur Screen: NOT DETECTED
Benzodiazepine, Ur Scrn: NOT DETECTED
Cannabinoid 50 Ng, Ur ~~LOC~~: NOT DETECTED
Cocaine Metabolite,Ur ~~LOC~~: NOT DETECTED
MDMA (Ecstasy)Ur Screen: NOT DETECTED
Methadone Scn, Ur: NOT DETECTED
Opiate, Ur Screen: NOT DETECTED
Phencyclidine (PCP) Ur S: NOT DETECTED
Tricyclic, Ur Screen: NOT DETECTED

## 2019-01-20 LAB — URINALYSIS, COMPLETE (UACMP) WITH MICROSCOPIC
Bacteria, UA: NONE SEEN
Bilirubin Urine: NEGATIVE
Glucose, UA: NEGATIVE mg/dL
Hgb urine dipstick: NEGATIVE
Ketones, ur: NEGATIVE mg/dL
Leukocytes,Ua: NEGATIVE
Nitrite: NEGATIVE
Protein, ur: NEGATIVE mg/dL
Specific Gravity, Urine: 1.004 — ABNORMAL LOW (ref 1.005–1.030)
Squamous Epithelial / HPF: NONE SEEN (ref 0–5)
pH: 6 (ref 5.0–8.0)

## 2019-01-20 LAB — CBC WITH DIFFERENTIAL/PLATELET
Abs Immature Granulocytes: 0.01 10*3/uL (ref 0.00–0.07)
Basophils Absolute: 0.1 10*3/uL (ref 0.0–0.1)
Basophils Relative: 1 %
Eosinophils Absolute: 0.2 10*3/uL (ref 0.0–0.5)
Eosinophils Relative: 3 %
HCT: 38.6 % — ABNORMAL LOW (ref 39.0–52.0)
Hemoglobin: 12 g/dL — ABNORMAL LOW (ref 13.0–17.0)
Immature Granulocytes: 0 %
Lymphocytes Relative: 38 %
Lymphs Abs: 2.4 10*3/uL (ref 0.7–4.0)
MCH: 25.4 pg — ABNORMAL LOW (ref 26.0–34.0)
MCHC: 31.1 g/dL (ref 30.0–36.0)
MCV: 81.8 fL (ref 80.0–100.0)
Monocytes Absolute: 0.6 10*3/uL (ref 0.1–1.0)
Monocytes Relative: 9 %
Neutro Abs: 3.1 10*3/uL (ref 1.7–7.7)
Neutrophils Relative %: 49 %
Platelets: 270 10*3/uL (ref 150–400)
RBC: 4.72 MIL/uL (ref 4.22–5.81)
RDW: 18.9 % — ABNORMAL HIGH (ref 11.5–15.5)
WBC: 6.2 10*3/uL (ref 4.0–10.5)
nRBC: 0 % (ref 0.0–0.2)

## 2019-01-20 LAB — ETHANOL: Alcohol, Ethyl (B): 403 mg/dL (ref <10)

## 2019-01-20 MED ORDER — IOHEXOL 300 MG/ML  SOLN
100.0000 mL | Freq: Once | INTRAMUSCULAR | Status: AC | PRN
  Administered 2019-01-20: 100 mL via INTRAVENOUS

## 2019-01-20 NOTE — ED Notes (Signed)
Pt able to ambulate with steady gate , without assistance

## 2019-01-20 NOTE — ED Notes (Signed)
Date and time results received: 01/20/19 6:19 PM    Test: Alcohol Critical Value: 403  Name of Provider Notified: Alfred Levins

## 2019-01-20 NOTE — Discharge Instructions (Addendum)
You have been seen in the Emergency Department (ED) for abdominal pain.  Your evaluation did not identify a clear cause of your symptoms but was generally reassuring. ° °Abdominal pain has many possible causes. Some aren't serious and get better on their own in a few days. Others need more testing and treatment. If your pain continues or gets worse, you need to be rechecked and may need more tests to find out what is wrong. You may need surgery to correct the problem.  ° °Follow up with your doctor in 12-24 hours if you are still having abdominal pain. Otherwise follow up in 1-3 days for a re-check ° °Kendric't ignore new symptoms, such as fever, nausea and vomiting, new or worsening abdominal pain, urination problems, bloody diarrhea or bloody stools, black tarry stools, uncontrollable nausea and vomiting, and dizziness. These may be signs of a more serious problem. If you develop any of these you should be seen by your doctor immediately or return to the ED. ° ° °How can you care for yourself at home?  °Rest until you feel better.  °To prevent dehydration, drink plenty of fluids, enough so that your urine is light yellow or clear like water. Choose water and other caffeine-free clear liquids until you feel better. If you have kidney, heart, or liver disease and have to limit fluids, talk with your doctor before you increase the amount of fluids you drink.  °If your stomach is upset, eat mild foods, such as rice, dry toast or crackers, bananas, and applesauce. Try eating several small meals instead of two or three large ones.  °Wait until 48 hours after all symptoms have gone away before you have spicy foods, alcohol, and drinks that contain caffeine.  °Do not eat foods that are high in fat.  °Avoid anti-inflammatory medicines such as aspirin, ibuprofen (Advil, Motrin), and naproxen (Aleve). These can cause stomach upset. Talk to your doctor if you take daily aspirin for another health problem. ° °When should you call  for help?  °Call 911 anytime you think you may need emergency care. For example, call if:  °You passed out (lost consciousness).  °You pass maroon or very bloody stools.  °You vomit blood or what looks like coffee grounds.  °You have new, severe belly pain. ° °Call your doctor now or seek immediate medical care if:  °Your pain gets worse, especially if it becomes focused in one area of your belly.  °You have a new or higher fever.  °Your stools are black and look like tar, or they have streaks of blood.  °You have unexpected vaginal bleeding.  °You have symptoms of a urinary tract infection. These may include:  °Pain when you urinate.  °Urinating more often than usual.  °Blood in your urine. °You are dizzy or lightheaded, or you feel like you may faint. °Watch closely for changes in your health, and be sure to contact your doctor if:  °You are not getting better after 1 day (24 hours). ° ° °

## 2019-01-20 NOTE — ED Notes (Signed)
Pt to ct at this time.

## 2019-01-20 NOTE — ED Triage Notes (Signed)
PT to ER via EMS from home after wife called for wheezing.  Pt has hx of ETOH abuse and has been drinking today.  When asked why he is in the ER, pt responds "I Adorian't feel well".  Pt is unable to elaborate.  Upon questioning by Dr. Alfred Levins, pt states his wife called 35 because she was mad at him.

## 2019-01-20 NOTE — ED Provider Notes (Signed)
[facility] Emergency Department Provider Note  ____________________________________________  Time seen: Approximately 5:07 PM  I have reviewed the triage vital signs and the nursing notes.   HISTORY  Chief Complaint Alcohol Intoxication  Level 5 caveat:  Portions of the history and physical were unable to be obtained due to intoxication   HPI Miguel [name] is a 58 y.o. male with h/o alcohol abuse who presents via EMS for alcohol intoxication.  Patient's wife called 911 after they had an argument.  According to the patient his wife has MS and he is the sole caretaker.  They were arguing because today's her anniversary and patient was drunk.  Patient is unable to tell me how much he has drunk today.  He is clearly intoxicated.  Crying and saying "I am clearly not doing a good job taking care of my wife". Patient is complaining intermittently of L sided abdominal pain and dysuria for 2 days. Reports h/o UTIs and kidney stone. Unable to give any further details on the pain.    Past Medical History:  Diagnosis Date  . Alcohol abuse   . Asthma   . GERD (gastroesophageal reflux disease)   . Gout   . Hypertension   . Kidney stone   . OCD (obsessive compulsive disorder)   . Renal colic     Patient Active Problem List   Diagnosis Date Noted  . Breast lump or mass 10/04/2018  . Sepsis secondary to UTI ([place]) 09/14/2018  . Asthma 07/07/2018  . GERD (gastroesophageal reflux disease) 07/07/2018  . OCD (obsessive compulsive disorder) 07/07/2018  . Self-inflicted laceration of wrist 03/10/2018  . Leg hematoma 12/25/2017  . Suicide and self-inflicted injury by cutting and piercing instrument ([place]) 03/10/2017  . Severe recurrent major 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 ([place]) 03/09/2017  . Substance induced mood disorder ([place]) 08/15/2016  . Involuntary commitment 08/15/2016  . Alcohol use disorder, severe, dependence ([place]) 02/05/2016  . Hypertension 12/05/2015    . Tachycardia 12/05/2015  . Gout 11/13/2015  . Chronic back pain 05/02/2015    Past Surgical History:  Procedure Laterality Date  . CHOLECYSTECTOMY  2012  . EXTRACORPOREAL SHOCK WAVE LITHOTRIPSY Left 01/12/2019   Procedure: EXTRACORPOREAL SHOCK WAVE LITHOTRIPSY (ESWL);  Surgeon: Billey Co, MD;  Location: [facility] ORS;  Service: Urology;  Laterality: Left;    Prior to Admission medications   Medication Sig Start Date End Date Taking? Authorizing Provider  albuterol (VENTOLIN HFA) 108 (90 Base) MCG/ACT inhaler Inhale 2 puffs into the lungs every 4 (four) hours as needed. 09/01/18   Tukov-Yual, Arlyss Gandy, NP  allopurinol (ZYLOPRIM) 300 MG tablet Take 1 tablet (300 mg total) by mouth daily. 09/01/18   Erlene Quan, NP  docusate sodium (COLACE) 100 MG capsule Take 1 tablet once or twice daily as needed for constipation while taking narcotic pain medicine 01/09/19   Hinda Kehr, MD  Fluticasone-Salmeterol (ADVAIR DISKUS) 100-50 MCG/DOSE AEPB Inhale 1 puff into the lungs 2 (two) times daily. 09/01/18   Tukov-Yual, Arlyss Gandy, NP  folic acid (FOLVITE) 1 MG tablet Take 1 tablet (1 mg total) by mouth daily. 09/01/18   Tukov-Yual, Arlyss Gandy, NP  lisinopril (PRINIVIL,ZESTRIL) 20 MG tablet Take 0.5 tablets (10 mg total) by mouth daily. Patient taking differently: Take 20 mg by mouth daily.  10/04/18   Iloabachie, Chioma E, NP  Multiple Vitamins-Minerals (MULTIVITAMIN WITH MINERALS) tablet Take 1 tablet by mouth daily. 09/01/18   Tukov-Yual, Arlyss Gandy, NP  ondansetron (ZOFRAN ODT) 4  MG disintegrating tablet Allow 1-2 tablets to dissolve in your mouth every 8 hours as needed for nausea/vomiting 01/09/19   Hinda Kehr, MD  tamsulosin [facility]) 0.4 MG CAPS capsule Take 1 tablet by mouth daily until you pass the kidney stone or no longer have symptoms 01/09/19   Hinda Kehr, MD  tamsulosin (FLOMAX) 0.4 MG CAPS capsule Take 1 capsule (0.4 mg total) by mouth daily after supper. 01/12/19    Billey Co, MD    Allergies Percocet [oxycodone-acetaminophen]  Family History  Problem Relation Age of Onset  . Alcohol abuse Father   . Breast cancer Mother 21    Social History Social History   Tobacco Use  . Smoking status: Former Research scientist (life sciences)  . Smokeless tobacco: Never Used  . Tobacco comment: quit 30 years ago  Substance Use Topics  . Alcohol use: Yes    Alcohol/week: 7.0 standard drinks    Types: 7 Glasses of wine per week    Comment: wine earlier today  . Drug use: No    Review of Systems  Constitutional: Negative for fever. Eyes: Negative for visual changes. ENT: Negative for sore throat. Neck: No neck pain  Cardiovascular: Negative for chest pain. Respiratory: Negative for shortness of breath. Gastrointestinal: + Left sided abdominal pain. No vomiting or diarrhea. Genitourinary: + dysuria. Musculoskeletal: Negative for back pain. Skin: Negative for rash. Neurological: Negative for headaches, weakness or numbness. Psych: No SI or HI  ____________________________________________   PHYSICAL EXAM:  VITAL SIGNS: Vitals:   01/20/19 1730 01/20/19 1830  BP: 139/90 138/82  Pulse: 92 92  Resp: (!) 9 10  SpO2: 97% 97%   Constitutional: Alert and oriented, clearly intoxicated, crying, no distress.  HEENT:      Head: Normocephalic and atraumatic.         Eyes: Conjunctivae are normal. Sclera is non-icteric.       Mouth/Throat: Mucous membranes are moist.       Neck: Supple with no signs of meningismus. Cardiovascular: Tachycardic with regular rhythm. No murmurs, gallops, or rubs. 2+ symmetrical distal pulses are present in all extremities. No JVD. Respiratory: Normal respiratory effort. Lungs are clear to auscultation bilaterally. No wheezes, crackles, or rhonchi.  Gastrointestinal: Soft, mild LLQ ttp, and non distended with positive bowel sounds. No rebound or guarding. Genitourinary: No CVA tenderness. Musculoskeletal: Nontender with normal range of  motion in all extremities. No edema, cyanosis, or erythema of extremities. Neurologic: Normal speech and language. Face is symmetric. Moving all extremities. No gross focal neurologic deficits are appreciated. Skin: Skin is warm, dry and intact. No rash noted. Psychiatric: Mood and affect are normal. Speech and behavior are normal.  ____________________________________________   LABS (all labs ordered are listed, but only abnormal results are displayed)  Labs Reviewed  CBC WITH DIFFERENTIAL/PLATELET - Abnormal; Notable for the following components:      Result Value   Hemoglobin 12.0 (*)    HCT 38.6 (*)    MCH 25.4 (*)    RDW 18.9 (*)    All other components within normal limits  COMPREHENSIVE METABOLIC PANEL - Abnormal; Notable for the following components:   Glucose, Bld 115 (*)    Calcium 8.7 (*)    All other components within normal limits  ETHANOL - Abnormal; Notable for the following components:   Alcohol, Ethyl (B) 403 (*)    All other components within normal limits  URINALYSIS, COMPLETE (UACMP) WITH MICROSCOPIC - Abnormal; Notable for the following components:   Color, Urine STRAW (*)  APPearance CLEAR (*)    Specific Gravity, Urine 1.004 (*)    All other components within normal limits  URINE DRUG SCREEN, QUALITATIVE ([facility] ONLY)   ____________________________________________  EKG  ED ECG REPORT I, Rudene Re, the attending physician, personally viewed and interpreted this ECG.  Normal sinus rhythm, rate 92, normal intervals, normal axis, no ST elevations or depressions.  Normal EKG. ____________________________________________  RADIOLOGY  I have personally reviewed the images performed during this visit and I agree with the Radiologist's read.   Interpretation by Radiologist:  Ct Abdomen Pelvis W Contrast  Result Date: 01/20/2019 CLINICAL DATA:  Left lower quadrant abdominal pain. EXAM: CT ABDOMEN AND PELVIS WITH CONTRAST TECHNIQUE: Multidetector CT  imaging of the abdomen and pelvis was performed using the standard protocol following bolus administration of intravenous contrast. CONTRAST:  163mL OMNIPAQUE IOHEXOL 300 MG/ML  SOLN COMPARISON:  CT of the abdomen and pelvis 01/09/2019 FINDINGS: Lower chest: Lung bases are clear without focal nodule, mass, or airspace disease. The heart size is normal. There is no significant pleural or pericardial effusion. Hepatobiliary: No focal liver abnormality is seen. Status post cholecystectomy. No biliary dilatation. Pancreas: Unremarkable. No pancreatic ductal dilatation or surrounding inflammatory changes. Spleen: Normal in size without focal abnormality. Adrenals/Urinary Tract: The adrenal glands are normal bilaterally. Bilateral punctate nonobstructing stones are again seen. Previously noted left ureteral stone has cleared. Ureters are within normal limits bilaterally. The urinary bladder is normal. There is no hydronephrosis. Stomach/Bowel: Stomach is within normal limits. Duodenum is unremarkable. Small bowel is within normal limits. Terminal ileum is unremarkable. The appendix is visualized and normal. The ascending and transverse colon is normal. Mild diverticular changes are present in the sigmoid colon no inflammatory changes are evident to suggest diverticulitis. Vascular/Lymphatic: Atherosclerotic changes are noted in the aorta without aneurysm or focal stenosis. Reproductive: Prostate gland is enlarged, measuring 5 cm transverse diameter. Other: No abdominal wall hernia or abnormality. No abdominopelvic ascites. Musculoskeletal: Vertebral body heights and alignment are normal. No focal lytic or blastic lesions are present. Bony pelvis is normal. Hips are located and within limits. IMPRESSION: 1. Acute or focal lesion to explain abdominal pain. 2. Mild diverticular changes in the sigmoid colon without evidence for diverticulitis. 3. Previously noted obstructing in the left kidney stone and hydronephrosis is  cleared. 4. Multiple bilateral nonobstructing stones are again present in both kidneys. Electronically Signed   By: San Morelle M.D.   On: 01/20/2019 19:54     ____________________________________________   PROCEDURES  Procedure(s) performed: None Procedures Critical Care performed:  None ____________________________________________   INITIAL IMPRESSION / ASSESSMENT AND PLAN / ED COURSE   58 y.o. male with h/o alcohol abuse who presents via EMS for alcohol intoxication also possible LLQ abdominal pain (patient keeps changing his history of why he is here and if he has abdominal pain).  Patient is clearly intoxicated, crying, upset about being drunk on his anniversary which led to a fight with his wife and EMS being called to the house.  He does have mild tenderness in the left lower quadrant with no rebound or guarding. Will get CT to rule out diverticulitis versus kidney stone versus SBO.  Will check UA to rule out UTI. his vitals are within normal limits.  Will check urinalysis to rule out UTI.  Will check drug screen and ethanol level.   Clinical Course as of Jan 19 2022  Fri Jan 20, 2019  2017 CT abdomen pelvis with no acute findings.  UA negative for  UTI.  Alcohol level 403.  Patient looks clinically sober.  Has a sober ride home.  Will discharge home.   [CV]    Clinical Course User Index [CV] Alfred Levins [place], MD     As part of my medical decision making, I reviewed the following data within the [place] notes reviewed and incorporated, Labs reviewed , EKG interpreted , Old EKG reviewed, Old chart reviewed, Radiograph reviewed , Notes from prior ED visits and [place] Controlled Substance Database    Pertinent labs & imaging results that were available during my care of the patient were reviewed by me and considered in my medical decision making (see chart for details).    ____________________________________________   FINAL CLINICAL  IMPRESSION(S) / ED DIAGNOSES  Final diagnoses:  Alcoholic intoxication without complication (HCC)  Left lower quadrant abdominal pain      NEW MEDICATIONS STARTED DURING THIS VISIT:  ED Discharge Orders    None       Note:  This document was prepared using Dragon voice recognition software and may include unintentional dictation errors.    Rudene Re, MD 01/20/19 2023

## 2019-01-25 ENCOUNTER — Other Ambulatory Visit: Payer: Self-pay

## 2019-01-25 ENCOUNTER — Encounter: Payer: Self-pay | Admitting: Emergency Medicine

## 2019-01-25 DIAGNOSIS — Z5321 Procedure and treatment not carried out due to patient leaving prior to being seen by health care provider: Secondary | ICD-10-CM | POA: Insufficient documentation

## 2019-01-25 DIAGNOSIS — J45909 Unspecified asthma, uncomplicated: Secondary | ICD-10-CM | POA: Insufficient documentation

## 2019-01-25 LAB — URINALYSIS, COMPLETE (UACMP) WITH MICROSCOPIC
Bacteria, UA: NONE SEEN
Bilirubin Urine: NEGATIVE
Glucose, UA: NEGATIVE mg/dL
Hgb urine dipstick: NEGATIVE
Ketones, ur: NEGATIVE mg/dL
Nitrite: NEGATIVE
Protein, ur: NEGATIVE mg/dL
Specific Gravity, Urine: 1.013 (ref 1.005–1.030)
pH: 6 (ref 5.0–8.0)

## 2019-01-25 NOTE — ED Triage Notes (Signed)
Pt states he has a kidney stone that he wants rechecked and is "just going to stay here until tomorrow since it is bothering him" and states "even if I have to stay in the car". Pt reports he is also having some asthma symptoms as well. Inhaler used at home. No increased work of breathing or acute distress noted. Denies cough or congestion. Skin warm and dry.

## 2019-01-26 ENCOUNTER — Emergency Department
Admission: EM | Admit: 2019-01-26 | Discharge: 2019-01-26 | Disposition: A | Discharge status: Home / Self Care | Payer: Medicaid Other | Attending: Emergency Medicine | Admitting: Emergency Medicine

## 2019-01-26 ENCOUNTER — Ambulatory Visit: Payer: Self-pay | Admitting: Urology

## 2019-01-26 ENCOUNTER — Telehealth: Payer: Self-pay | Admitting: Urology

## 2019-01-26 NOTE — ED Notes (Signed)
No answer when called several times from lobby 

## 2019-01-26 NOTE — Telephone Encounter (Signed)
-----   Message from Billey Co, MD sent at 01/25/2019  4:57 PM EDT ----- Regarding: follow up He does not need to get his KUB before clinic visit Thursday 3/12, his recent CT showed his stone is completely gone after SWL.  Nickolas Madrid, MD 01/25/2019

## 2019-02-14 ENCOUNTER — Ambulatory Visit: Payer: Self-pay | Admitting: Urology

## 2019-02-17 ENCOUNTER — Telehealth: Payer: Self-pay | Admitting: Pharmacist

## 2019-02-17 NOTE — Telephone Encounter (Signed)
02/17/2019 9:26:50 AM - Ventolin HFA & Advair 100/50 refills  02/17/2019 Faxed scripts to [facility] for refills : Ventolin HFA 8mcg Inject 2 puffs every four hours as needed, also Advair 100/50 Inhale 1 puff two times a day, rinse mouth and spit after each use. Delos Haring

## 2019-02-21 ENCOUNTER — Other Ambulatory Visit: Payer: Self-pay

## 2019-02-21 ENCOUNTER — Ambulatory Visit: Payer: Medicaid Other | Admitting: Gerontology

## 2019-02-21 DIAGNOSIS — I10 Essential (primary) hypertension: Secondary | ICD-10-CM

## 2019-02-21 DIAGNOSIS — R252 Cramp and spasm: Secondary | ICD-10-CM

## 2019-02-21 DIAGNOSIS — F1020 Alcohol dependence, uncomplicated: Secondary | ICD-10-CM

## 2019-02-21 MED ORDER — LISINOPRIL 20 MG PO TABS: 20.0000 mg | ORAL_TABLET | Freq: Every day | ORAL | 1 refills | Status: DC

## 2019-02-21 MED ORDER — TIZANIDINE HCL 4 MG PO TABS: 2.0000 mg | ORAL_TABLET | Freq: Three times a day (TID) | ORAL | 0 refills | Status: DC | PRN

## 2019-02-21 NOTE — Progress Notes (Signed)
Established Patient Office Visit  Subjective:  Patient ID: Miguel [name], male    DOB: 05/18/1961  Age: 58 y.o. MRN: [id number]  CC:  Chief Complaint  Patient presents with  . Follow-up    high blood pressure    HPI Miguel [name] presents for follow up on ETOH, hypertension and Muscle spasm.  Patient consents to telephone visit and used 2 patient identifier to identify patient.  Hypertension:  He states that he is compliant with current regimen and takes 20 mg lisinopril daily. He denies cough, headache, chest pain, palpitation and light headedness. He states that he is adhering to low salt diet and not exercising because of the stay at home order for COVID 19. He checks his blood pressure at home and it was 165/101 and HR 87 during telephone visit.  Alchol use disorder: He was treated at the ED on 01/20/2019 for Alcohol intoxication. He states that he has not had any vodka since hospital discharge, but drinks wine. He reports that he drinks 2 8 oz glasses of wine daily. He states that he still experiences mild visible tremors to his fingers when it's stretched, but he denies diaphoresis, tachycardia, nausea, hallucination, and anxiety.  He states that he's working on quitting.  Muscle Spasm: He states that he continues to experience intermittent muscle spasm at night to both calf. He states that muscle spasm when it occurs , lasts about 2-3 hours and it keeps him up at night. He states that It's being going on for many years, and that taking  tizanidine every  8 hours helps to relieve spasms. He denies edema to lower extremity and claudication. He denies fever, chills and no further concern.  Past Medical History:  Diagnosis Date  . Alcohol abuse   . Asthma   . GERD (gastroesophageal reflux disease)   . Gout   . Hypertension   . Kidney stone   . OCD (obsessive compulsive disorder)   . Renal colic     Past Surgical History:  Procedure Laterality Date  . CHOLECYSTECTOMY  2012  .  EXTRACORPOREAL SHOCK WAVE LITHOTRIPSY Left 01/12/2019   Procedure: EXTRACORPOREAL SHOCK WAVE LITHOTRIPSY (ESWL);  Surgeon: Billey Co, MD;  Location: [facility] ORS;  Service: Urology;  Laterality: Left;    Family History  Problem Relation Age of Onset  . Alcohol abuse Father   . Breast cancer Mother 39    Social History   Socioeconomic History  . Marital status: Married    Spouse name: Not on file  . Number of children: Not on file  . Years of education: Not on file  . Highest education level: Not on file  Occupational History  . Not on file  Social Needs  . Financial resource strain: Very hard  . Food insecurity:    Worry: Never true    Inability: Never true  . Transportation needs:    Medical: No    Non-medical: No  Tobacco Use  . Smoking status: Former Research scientist (life sciences)  . Smokeless tobacco: Never Used  . Tobacco comment: quit 30 years ago  Substance and Sexual Activity  . Alcohol use: Yes    Alcohol/week: 7.0 standard drinks    Types: 7 Glasses of wine per week    Comment: wine earlier today  . Drug use: No  . Sexual activity: Yes  Lifestyle  . Physical activity:    Days per week: 2 days    Minutes per session: 10 min  . Stress: Very much  Relationships  . Social connections:    Talks on phone: Once a week    Gets together: Not on file    Attends religious service: Never    Active member of club or organization: No    Attends meetings of clubs or organizations: Never    Relationship status: Married  . Intimate partner violence:    Fear of current or ex partner: No    Emotionally abused: No    Physically abused: No    Forced sexual activity: No  Other Topics Concern  . Not on file  Social History Narrative   Lives at home with his wife and takes care of his wife. Independent at baseline      - Biggest strain is financial but doesn't think they could got get more help.      Patient expressed interest in possible financial assistance. Informed written consent  obtained    Outpatient Medications Prior to Visit  Medication Sig Dispense Refill  . albuterol (VENTOLIN HFA) 108 (90 Base) MCG/ACT inhaler Inhale 2 puffs into the lungs every 4 (four) hours as needed. 72 g 0  . allopurinol (ZYLOPRIM) 300 MG tablet Take 1 tablet (300 mg total) by mouth daily. 90 tablet 3  . Fluticasone-Salmeterol (ADVAIR DISKUS) 100-50 MCG/DOSE AEPB Inhale 1 puff into the lungs 2 (two) times daily. 852 each 0  . folic acid (FOLVITE) 1 MG tablet Take 1 tablet (1 mg total) by mouth daily. 90 tablet 3  . Multiple Vitamins-Minerals (MULTIVITAMIN WITH MINERALS) tablet Take 1 tablet by mouth daily. 90 tablet 3  . ondansetron (ZOFRAN ODT) 4 MG disintegrating tablet Allow 1-2 tablets to dissolve in your mouth every 8 hours as needed for nausea/vomiting 30 tablet 0  . lisinopril (PRINIVIL,ZESTRIL) 20 MG tablet Take 0.5 tablets (10 mg total) by mouth daily. (Patient taking differently: Take 20 mg by mouth daily. ) 30 tablet 3  . tiZANidine (ZANAFLEX) 4 MG tablet Take 4 mg by mouth every 6 (six) hours as needed for muscle spasms.    Marland Kitchen docusate sodium (COLACE) 100 MG capsule Take 1 tablet once or twice daily as needed for constipation while taking narcotic pain medicine (Patient not taking: Reported on 02/21/2019) 30 capsule 0  . tamsulosin (FLOMAX) 0.4 MG CAPS capsule Take 1 tablet by mouth daily until you pass the kidney stone or no longer have symptoms (Patient not taking: Reported on 02/21/2019) 14 capsule 0  . tamsulosin (FLOMAX) 0.4 MG CAPS capsule Take 1 capsule (0.4 mg total) by mouth daily after supper. (Patient not taking: Reported on 02/21/2019) 30 capsule 0   No facility-administered medications prior to visit.     Allergies  Allergen Reactions  . Percocet [Oxycodone-Acetaminophen] Nausea And Vomiting    ROS Review of Systems  Constitutional: Negative.   HENT: Negative.   Respiratory: Negative.   Cardiovascular: Negative.   Genitourinary: Negative.   Musculoskeletal:  Positive for myalgias (intermittent spasm to calves).  Skin: Negative.   Neurological: Positive for tremors (peripheral tremor due to alcohol consumption.).  Psychiatric/Behavioral: Negative.       Objective:    Physical Exam  No vital signs obtained , and no PE, due to telephone visit. There were no vitals taken for this visit. Wt Readings from Last 3 Encounters:  01/25/19 190 lb (86.2 kg)  01/20/19 190 lb (86.2 kg)  01/10/19 190 lb (86.2 kg)     Health Maintenance Due  Topic Date Due  . Hepatitis C Screening  07/26/1961  . COLONOSCOPY  10/11/2009    There are no preventive care reminders to display for this patient.  Lab Results  Component Value Date   TSH 4.525 (H) 03/12/2017   Lab Results  Component Value Date   WBC 6.2 01/20/2019   HGB 12.0 (L) 01/20/2019   HCT 38.6 (L) 01/20/2019   MCV 81.8 01/20/2019   PLT 270 01/20/2019   Lab Results  Component Value Date   NA 144 01/20/2019   K 3.9 01/20/2019   CO2 27 01/20/2019   GLUCOSE 115 (H) 01/20/2019   BUN 15 01/20/2019   CREATININE 0.92 01/20/2019   BILITOT 0.4 01/20/2019   ALKPHOS 60 01/20/2019   AST 16 01/20/2019   ALT 13 01/20/2019   PROT 6.8 01/20/2019   ALBUMIN 3.7 01/20/2019   CALCIUM 8.7 (L) 01/20/2019   ANIONGAP 10 01/20/2019   Lab Results  Component Value Date   CHOL 144 03/12/2017   Lab Results  Component Value Date   HDL 52 03/12/2017   Lab Results  Component Value Date   LDLCALC 71 03/12/2017   Lab Results  Component Value Date   TRIG 107 03/12/2017   Lab Results  Component Value Date   CHOLHDL 2.8 03/12/2017   Lab Results  Component Value Date   HGBA1C 5.1 03/12/2017      Assessment & Plan:     1. Essential hypertension - He will continue on current medication . -Low salt DASH diet . Take medications regularly on time . Exercise regularly as tolerated . Check blood pressure at least once a day, record and bring log to next week follow up appointment. . Goal is  less than 140/90 and normal blood pressure is 120/80  - lisinopril (PRINIVIL,ZESTRIL) 20 MG tablet; Take 1 tablet (20 mg total) by mouth daily.  Dispense: 30 tablet; Refill: 1  2. Alcohol use disorder, severe, dependence ([place]) - He was encouraged to decrease alcohol consumption - He will follow up with Ms. Jene Every for counseling. 3. Muscle cramp, nocturnal - He will continue tizanidine and was educated on medication side effects and was advised to notify clinic. - tiZANidine (ZANAFLEX) 4 MG tablet; Take 0.5 tablets (2 mg total) by mouth every 8 (eight) hours as needed for muscle spasms.  Dispense: 30 tablet; Refill: 0   Follow-up: Return in about 1 week (around 02/28/2019), or if symptoms worsen or fail to improve.    Evora Schechter Jerold Coombe, NP

## 2019-02-28 ENCOUNTER — Ambulatory Visit: Payer: Medicaid Other | Admitting: Gerontology

## 2019-03-01 ENCOUNTER — Ambulatory Visit: Payer: Medicaid Other | Admitting: Gerontology

## 2019-03-01 ENCOUNTER — Encounter: Payer: Self-pay | Admitting: Gerontology

## 2019-03-01 ENCOUNTER — Other Ambulatory Visit: Payer: Self-pay

## 2019-03-01 ENCOUNTER — Emergency Department
Admission: EM | Admit: 2019-03-01 | Discharge: 2019-03-01 | Disposition: A | Discharge status: Home / Self Care | Payer: Medicaid Other | Attending: Emergency Medicine | Admitting: Emergency Medicine

## 2019-03-01 DIAGNOSIS — J45909 Unspecified asthma, uncomplicated: Secondary | ICD-10-CM | POA: Insufficient documentation

## 2019-03-01 DIAGNOSIS — F1020 Alcohol dependence, uncomplicated: Secondary | ICD-10-CM

## 2019-03-01 DIAGNOSIS — F10930 Alcohol use, unspecified with withdrawal, uncomplicated: Secondary | ICD-10-CM

## 2019-03-01 DIAGNOSIS — I10 Essential (primary) hypertension: Secondary | ICD-10-CM | POA: Insufficient documentation

## 2019-03-01 DIAGNOSIS — Z915 Personal history of self-harm: Secondary | ICD-10-CM | POA: Insufficient documentation

## 2019-03-01 DIAGNOSIS — Z87891 Personal history of nicotine dependence: Secondary | ICD-10-CM | POA: Insufficient documentation

## 2019-03-01 DIAGNOSIS — F10230 Alcohol dependence with withdrawal, uncomplicated: Secondary | ICD-10-CM

## 2019-03-01 LAB — CBC WITH DIFFERENTIAL/PLATELET
Abs Immature Granulocytes: 0.02 10*3/uL (ref 0.00–0.07)
Basophils Absolute: 0.1 10*3/uL (ref 0.0–0.1)
Basophils Relative: 1 %
Eosinophils Absolute: 0 10*3/uL (ref 0.0–0.5)
Eosinophils Relative: 1 %
HCT: 41.6 % (ref 39.0–52.0)
Hemoglobin: 13.4 g/dL (ref 13.0–17.0)
Immature Granulocytes: 0 %
Lymphocytes Relative: 29 %
Lymphs Abs: 1.9 10*3/uL (ref 0.7–4.0)
MCH: 27 pg (ref 26.0–34.0)
MCHC: 32.2 g/dL (ref 30.0–36.0)
MCV: 83.9 fL (ref 80.0–100.0)
Monocytes Absolute: 0.7 10*3/uL (ref 0.1–1.0)
Monocytes Relative: 11 %
Neutro Abs: 3.8 10*3/uL (ref 1.7–7.7)
Neutrophils Relative %: 58 %
Platelets: 182 10*3/uL (ref 150–400)
RBC: 4.96 MIL/uL (ref 4.22–5.81)
RDW: 20.8 % — ABNORMAL HIGH (ref 11.5–15.5)
WBC: 6.7 10*3/uL (ref 4.0–10.5)
nRBC: 0 % (ref 0.0–0.2)

## 2019-03-01 LAB — COMPREHENSIVE METABOLIC PANEL
ALT: 23 U/L (ref 0–44)
AST: 28 U/L (ref 15–41)
Albumin: 4.3 g/dL (ref 3.5–5.0)
Alkaline Phosphatase: 84 U/L (ref 38–126)
Anion gap: 18 — ABNORMAL HIGH (ref 5–15)
BUN: 10 mg/dL (ref 6–20)
CO2: 16 mmol/L — ABNORMAL LOW (ref 22–32)
Calcium: 9.3 mg/dL (ref 8.9–10.3)
Chloride: 100 mmol/L (ref 98–111)
Creatinine, Ser: 0.96 mg/dL (ref 0.61–1.24)
GFR calc Af Amer: >60 mL/min (ref >60)
GFR calc non Af Amer: >60 mL/min (ref >60)
Glucose, Bld: 118 mg/dL — ABNORMAL HIGH (ref 70–99)
Potassium: 3.9 mmol/L (ref 3.5–5.1)
Sodium: 134 mmol/L — ABNORMAL LOW (ref 135–145)
Total Bilirubin: 1.2 mg/dL (ref 0.3–1.2)
Total Protein: 7.8 g/dL (ref 6.5–8.1)

## 2019-03-01 LAB — TROPONIN I: Troponin I: <0.03 ng/mL (ref <0.03)

## 2019-03-01 MED ORDER — LORAZEPAM 1 MG PO TABS: 1.0000 mg | ORAL_TABLET | Freq: Four times a day (QID) | ORAL | Status: DC | PRN

## 2019-03-01 MED ORDER — LORAZEPAM 2 MG/ML IJ SOLN: 1.0000 mg | Freq: Four times a day (QID) | INTRAMUSCULAR | Status: DC | PRN

## 2019-03-01 MED ORDER — CHLORDIAZEPOXIDE HCL 25 MG PO CAPS: ORAL_CAPSULE | ORAL | 0 refills | Status: DC

## 2019-03-01 MED ORDER — THIAMINE HCL 100 MG/ML IJ SOLN
Freq: Once | INTRAVENOUS | Status: AC
  Administered 2019-03-01: 13:00:00 via INTRAVENOUS
  Filled 2019-03-01: qty 1000

## 2019-03-01 MED ORDER — LORAZEPAM 2 MG PO TABS
2.0000 mg | ORAL_TABLET | Freq: Once | ORAL | Status: AC
  Administered 2019-03-01: 2 mg via ORAL
  Filled 2019-03-01: qty 1

## 2019-03-01 NOTE — Progress Notes (Signed)
Established Patient Office Visit  Subjective:  Patient ID: Miguel [name], male    DOB: [birth year]  Age: 58 y.o. MRN: [id number]  CC:  Chief Complaint  Patient presents with  . Follow-up    HPI Miguel [name] presents for follow up for Alcohol use disorder.  Patient consents to telephone visit and 2 patient identifier was used to identify patient.  He states that he feels terrible, didn't sleep last night and he was very scared . He states that he has not being eating well for the past 3 days and has being drinking at least 1 quart of wine daily. He reports that he's having peripheral tremors to upper and lower extremity, and he has to hold a cup with both hands. He was crying, states that he can't sleep and that he's a nervous wreck. He reports being nauseous and vomited 10 times yesterday. He checked his blood pressure during the visit and it was 189/119 , HR 97 around 10:40. He states that he thought that a bug was on him and was scratching all over. He rechecked his BP at 11 am and it was 175/113 and 93 . He reports having a quart of wine this morning at 7 am. He denies fever, chills, chest pain and shortness of breath. He was concerned about being his wife's caretaker and unable to seek help at the hospital.       Past Medical History:  Diagnosis Date  . Alcohol abuse   . Asthma   . GERD (gastroesophageal reflux disease)   . Gout   . Hypertension   . Kidney stone   . OCD (obsessive compulsive disorder)   . Renal colic     Past Surgical History:  Procedure Laterality Date  . CHOLECYSTECTOMY  2012  . EXTRACORPOREAL SHOCK WAVE LITHOTRIPSY Left 01/12/2019   Procedure: EXTRACORPOREAL SHOCK WAVE LITHOTRIPSY (ESWL);  Surgeon: Billey Co, MD;  Location: [facility] ORS;  Service: Urology;  Laterality: Left;    Family History  Problem Relation Age of Onset  . Alcohol abuse Father   . Breast cancer Mother 63    Social History   Socioeconomic History  . Marital status: Married     Spouse name: Not on file  . Number of children: Not on file  . Years of education: Not on file  . Highest education level: Not on file  Occupational History  . Not on file  Social Needs  . Financial resource strain: Very hard  . Food insecurity:    Worry: Never true    Inability: Never true  . Transportation needs:    Medical: No    Non-medical: No  Tobacco Use  . Smoking status: Former Research scientist (life sciences)  . Smokeless tobacco: Never Used  . Tobacco comment: quit 30 years ago  Substance and Sexual Activity  . Alcohol use: Yes    Alcohol/week: 7.0 standard drinks    Types: 7 Glasses of wine per week    Comment: wine earlier today  . Drug use: No  . Sexual activity: Yes  Lifestyle  . Physical activity:    Days per week: 2 days    Minutes per session: 10 min  . Stress: Very much  Relationships  . Social connections:    Talks on phone: Once a week    Gets together: Not on file    Attends religious service: Never    Active member of club or organization: No    Attends meetings of clubs or organizations:  Never    Relationship status: Married  . Intimate partner violence:    Fear of current or ex partner: No    Emotionally abused: No    Physically abused: No    Forced sexual activity: No  Other Topics Concern  . Not on file  Social History Narrative   Lives at home with his wife and takes care of his wife. Independent at baseline      - Biggest strain is financial but doesn't think they could got get more help.      Patient expressed interest in possible financial assistance. Informed written consent obtained    No facility-administered medications prior to visit.    Outpatient Medications Prior to Visit  Medication Sig Dispense Refill  . albuterol (VENTOLIN HFA) 108 (90 Base) MCG/ACT inhaler Inhale 2 puffs into the lungs every 4 (four) hours as needed. 72 g 0  . allopurinol (ZYLOPRIM) 300 MG tablet Take 1 tablet (300 mg total) by mouth daily. 90 tablet 3  . docusate sodium  (COLACE) 100 MG capsule Take 1 tablet once or twice daily as needed for constipation while taking narcotic pain medicine 30 capsule 0  . Fluticasone-Salmeterol (ADVAIR DISKUS) 100-50 MCG/DOSE AEPB Inhale 1 puff into the lungs 2 (two) times daily. 034 each 0  . folic acid (FOLVITE) 1 MG tablet Take 1 tablet (1 mg total) by mouth daily. 90 tablet 3  . lisinopril (PRINIVIL,ZESTRIL) 20 MG tablet Take 1 tablet (20 mg total) by mouth daily. 30 tablet 1  . Multiple Vitamins-Minerals (MULTIVITAMIN WITH MINERALS) tablet Take 1 tablet by mouth daily. 90 tablet 3  . tiZANidine (ZANAFLEX) 4 MG tablet Take 0.5 tablets (2 mg total) by mouth every 8 (eight) hours as needed for muscle spasms. 30 tablet 0  . ondansetron (ZOFRAN ODT) 4 MG disintegrating tablet Allow 1-2 tablets to dissolve in your mouth every 8 hours as needed for nausea/vomiting (Patient not taking: Reported on 03/01/2019) 30 tablet 0  . tamsulosin (FLOMAX) 0.4 MG CAPS capsule Take 1 tablet by mouth daily until you pass the kidney stone or no longer have symptoms (Patient not taking: Reported on 03/01/2019) 14 capsule 0  . tamsulosin (FLOMAX) 0.4 MG CAPS capsule Take 1 capsule (0.4 mg total) by mouth daily after supper. (Patient not taking: Reported on 03/01/2019) 30 capsule 0    Allergies  Allergen Reactions  . Percocet [Oxycodone-Acetaminophen] Nausea And Vomiting    ROS Review of Systems  Constitutional: Positive for appetite change (decrease appetitie) and diaphoresis. Negative for chills and fever.  HENT: Negative.   Respiratory: Negative.   Cardiovascular: Negative.   Gastrointestinal: Negative.   Genitourinary: Negative.   Neurological: Positive for tremors (Peripheral tremor).  Psychiatric/Behavioral: Positive for hallucinations. The patient is nervous/anxious (due to alchohol abuse).       Objective:    Physical Exam  No vital sign or PE done. There were no vitals taken for this visit. Wt Readings from Last 3 Encounters:   03/01/19 185 lb (83.9 kg)  01/25/19 190 lb (86.2 kg)  01/20/19 190 lb (86.2 kg)     Health Maintenance Due  Topic Date Due  . Hepatitis C Screening  Dec 28, 1960  . COLONOSCOPY  10/11/2009    There are no preventive care reminders to display for this patient.  Lab Results  Component Value Date   TSH 4.525 (H) 03/12/2017   Lab Results  Component Value Date   WBC 6.7 03/01/2019   HGB 13.4 03/01/2019   HCT 41.6 03/01/2019  MCV 83.9 03/01/2019   PLT 182 03/01/2019   Lab Results  Component Value Date   NA 144 01/20/2019   K 3.9 01/20/2019   CO2 27 01/20/2019   GLUCOSE 115 (H) 01/20/2019   BUN 15 01/20/2019   CREATININE 0.92 01/20/2019   BILITOT 0.4 01/20/2019   ALKPHOS 60 01/20/2019   AST 16 01/20/2019   ALT 13 01/20/2019   PROT 6.8 01/20/2019   ALBUMIN 3.7 01/20/2019   CALCIUM 8.7 (L) 01/20/2019   ANIONGAP 10 01/20/2019   Lab Results  Component Value Date   CHOL 144 03/12/2017   Lab Results  Component Value Date   HDL 52 03/12/2017   Lab Results  Component Value Date   LDLCALC 71 03/12/2017   Lab Results  Component Value Date   TRIG 107 03/12/2017   Lab Results  Component Value Date   CHOLHDL 2.8 03/12/2017   Lab Results  Component Value Date   HGBA1C 5.1 03/12/2017      Assessment & Plan:     1. Alcohol use disorder, severe, dependence ([place]) - Ddx alcohol withdrawal. He was encouraged to go to the emergency room - He will follow up with Ms. Julian Hy tomorrow for counseling .   Follow-up: Return in about 1 week (around 03/08/2019), or if symptoms worsen or fail to improve.    Shareeka Yim Jerold Coombe, NP

## 2019-03-01 NOTE — ED Provider Notes (Signed)
[facility] Emergency Department Provider Note       Time seen: ----------------------------------------- 12:39 PM on 03/01/2019 -----------------------------------------   I have reviewed the triage vital signs and the nursing notes.  HISTORY   Chief Complaint Alcohol Intoxication    HPI Miguel [name] is a 58 y.o. male with a history of alcohol abuse, asthma, GERD, gout, hypertension, kidney stones, obsessive-compulsive disorder, renal colic who presents to the ED for possible DTs.  Patient typically drinks heavily every day and is well-known to the ER.  He reports he recently quit drinking.  He has been shaking and cannot stop shaking today.  He wants help going through detox.  He has had some diarrhea and vomiting.  He denies any seizures.  Past Medical History:  Diagnosis Date  . Alcohol abuse   . Asthma   . GERD (gastroesophageal reflux disease)   . Gout   . Hypertension   . Kidney stone   . OCD (obsessive compulsive disorder)   . Renal colic     Patient Active Problem List   Diagnosis Date Noted  . Breast lump or mass 10/04/2018  . Sepsis secondary to UTI ([place]) 09/14/2018  . Asthma 07/07/2018  . GERD (gastroesophageal reflux disease) 07/07/2018  . OCD (obsessive compulsive disorder) 07/07/2018  . Self-inflicted laceration of wrist 03/10/2018  . Leg hematoma 12/25/2017  . Suicide and self-inflicted injury by cutting and piercing instrument ([place]) 03/10/2017  . Severe recurrent major 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 ([place]) 03/09/2017  . Substance induced mood disorder ([place]) 08/15/2016  . Involuntary commitment 08/15/2016  . Alcohol use disorder, severe, dependence ([place]) 02/05/2016  . Hypertension 12/05/2015  . Tachycardia 12/05/2015  . Gout 11/13/2015  . Chronic back pain 05/02/2015    Past Surgical History:  Procedure Laterality Date  . CHOLECYSTECTOMY  2012  . EXTRACORPOREAL SHOCK WAVE LITHOTRIPSY Left 01/12/2019   Procedure:  EXTRACORPOREAL SHOCK WAVE LITHOTRIPSY (ESWL);  Surgeon: Billey Co, MD;  Location: [facility] ORS;  Service: Urology;  Laterality: Left;    Allergies Percocet [oxycodone-acetaminophen]  Social History Social History   Tobacco Use  . Smoking status: Former Research scientist (life sciences)  . Smokeless tobacco: Never Used  . Tobacco comment: quit 30 years ago  Substance Use Topics  . Alcohol use: Yes    Alcohol/week: 7.0 standard drinks    Types: 7 Glasses of wine per week    Comment: wine earlier today  . Drug use: No   Review of Systems Constitutional: Negative for fever. Cardiovascular: Negative for chest pain. Respiratory: Negative for shortness of breath. Gastrointestinal: Negative for abdominal pain, positive for diarrhea Musculoskeletal: Negative for back pain. Skin: Negative for rash. Neurological: Negative for headaches, Positive for tremor and weakness  All systems negative/normal/unremarkable except as stated in the HPI  ____________________________________________   PHYSICAL EXAM:  VITAL SIGNS: ED Triage Vitals  Enc Vitals Group     BP 03/01/19 1236 (!) 165/101     Pulse Rate 03/01/19 1236 94     Resp 03/01/19 1236 18     Temp 03/01/19 1236 98.3 F (36.8 C)     Temp Source 03/01/19 1236 Oral     SpO2 03/01/19 1236 99 %     Weight 03/01/19 1235 185 lb (83.9 kg)     Height 03/01/19 1235 6' (1.829 m)     Head Circumference --      Peak Flow --      Pain Score 03/01/19 1234 0     Pain Loc --  Pain Edu? --      Excl. in [place]? --    Constitutional: Alert and oriented.  Mild distress Eyes: Conjunctivae are normal. Normal extraocular movements. ENT      Head: Normocephalic and atraumatic.      Nose: No congestion/rhinnorhea.      Mouth/Throat: Mucous membranes are moist.      Neck: No stridor. Cardiovascular: Normal rate, regular rhythm. No murmurs, rubs, or gallops. Respiratory: Normal respiratory effort without tachypnea nor retractions. Breath sounds are clear and equal  bilaterally. No wheezes/rales/rhonchi. Gastrointestinal: Soft and nontender. Normal bowel sounds Musculoskeletal: Nontender with normal range of motion in extremities. No lower extremity tenderness nor edema. Neurologic:  Normal speech and language. No gross focal neurologic deficits are appreciated.  Tremor is noted Skin:  Skin is warm, dry and intact. No rash noted. Psychiatric: Mood and affect are normal. Speech and behavior are normal.  ____________________________________________  ED COURSE:  As part of my medical decision making, I reviewed the following data within the [place] History obtained from family if available, nursing notes, old chart and ekg, as well as notes from prior ED visits. Patient presented for alcohol withdrawal, we will assess with labs and imaging as indicated at this time.   Procedures  Miguel [name] was evaluated in Emergency Department on 03/01/2019 for the symptoms described in the history of present illness. He was evaluated in the context of the global COVID-19 pandemic, which necessitated consideration that the patient might be at risk for infection with the SARS-CoV-2 virus that causes COVID-19. Institutional protocols and algorithms that pertain to the evaluation of patients at risk for COVID-19 are in a state of rapid change based on information released by regulatory bodies including the CDC and federal and state organizations. These policies and algorithms were followed during the patient's care in the ED.  ____________________________________________   LABS (pertinent positives/negatives)  Labs Reviewed  CBC WITH DIFFERENTIAL/PLATELET - Abnormal; Notable for the following components:      Result Value   RDW 20.8 (*)    All other components within normal limits  COMPREHENSIVE METABOLIC PANEL - Abnormal; Notable for the following components:   Sodium 134 (*)    CO2 16 (*)    Glucose, Bld 118 (*)    Anion gap 18 (*)    All other  components within normal limits  TROPONIN I  ETHANOL   ___________________________________________   DIFFERENTIAL DIAGNOSIS   Acute alcohol withdrawal, DTs, dehydration, electrolyte abnormality  FINAL ASSESSMENT AND PLAN  Acute alcohol withdrawal   Plan: The patient had presented for cute alcohol withdrawal.  He received oral Ativan and has been started on CIWA protocol.  Patient's labs were reassuring.  Patient appears to be improving on CIWA protocol.  He is pending a detox referral.   Laurence Aly, MD    Note: This note was generated in part or whole with voice recognition software. Voice recognition is usually quite accurate but there are transcription errors that can and very often do occur. I apologize for any typographical errors that were not detected and corrected.     Earleen Newport, MD 03/01/19 1536

## 2019-03-01 NOTE — ED Notes (Signed)
TTS at bedside. 

## 2019-03-01 NOTE — BH Assessment (Signed)
This Probation officer spoke with pt to discuss substance use treatment options. Pt states he currently lives with his wife and acts as her primary care giver. Pt states he desires to seek help for his alcohol use; however, he states he is unable to leave his wife home alone due to her diagnosis of MS. Pt encouraged to engaged in outpatient treatment for additional support in his attempt towards sobriety.

## 2019-03-01 NOTE — ED Triage Notes (Signed)
Pt comes from home via EMS after having "DTs" pet pt. Pt was drinking a bottle of wine a day and a pint of vodka a day. He stopped drinking 3 weeks ago. Pt came in today for shaking. He states "he can't drive from the shaking" and he also drank some wine today to stop the shaking at about 7am. Pt wants help quitting drinking. Pt denies any diarrhea but endorses some vomitting. Pt denies any seizures.

## 2019-03-02 ENCOUNTER — Other Ambulatory Visit: Payer: Self-pay

## 2019-03-02 ENCOUNTER — Emergency Department
Admission: EM | Admit: 2019-03-02 | Discharge: 2019-03-02 | Disposition: A | Discharge status: Home / Self Care | Payer: Medicaid Other | Attending: Emergency Medicine | Admitting: Emergency Medicine

## 2019-03-02 DIAGNOSIS — F10230 Alcohol dependence with withdrawal, uncomplicated: Secondary | ICD-10-CM

## 2019-03-02 DIAGNOSIS — J45909 Unspecified asthma, uncomplicated: Secondary | ICD-10-CM | POA: Insufficient documentation

## 2019-03-02 DIAGNOSIS — F10930 Alcohol use, unspecified with withdrawal, uncomplicated: Secondary | ICD-10-CM

## 2019-03-02 DIAGNOSIS — Z79899 Other long term (current) drug therapy: Secondary | ICD-10-CM | POA: Insufficient documentation

## 2019-03-02 DIAGNOSIS — I10 Essential (primary) hypertension: Secondary | ICD-10-CM | POA: Insufficient documentation

## 2019-03-02 DIAGNOSIS — Z87891 Personal history of nicotine dependence: Secondary | ICD-10-CM | POA: Insufficient documentation

## 2019-03-02 MED ORDER — LORAZEPAM 2 MG PO TABS
2.0000 mg | ORAL_TABLET | Freq: Once | ORAL | Status: AC
  Administered 2019-03-02: 2 mg via ORAL
  Filled 2019-03-02: qty 1

## 2019-03-02 NOTE — ED Notes (Signed)
Meal was given at 5:19pm.

## 2019-03-02 NOTE — ED Provider Notes (Addendum)
[facility] Emergency Department Provider Note  Time seen: 4:37 PM  I have reviewed the triage vital signs and the nursing notes.   HISTORY  Chief Complaint Not feeling well  HPI Miguel [name] is a 58 y.o. male with a past medical history of alcohol abuse, hypertension, gastric reflux, presents to the emergency department for not feeling well.  According to the patient he last drank hard liquor 3 weeks ago, last drink beer 2 days ago.  Patient was seen in the emergency department for alcohol withdrawal symptoms, was prescribed Librium.  States he just started Librium last night but was told if he was feeling unwell to come back to the emergency department.  Patient states today he has felt shaky, was having difficulty sleeping last night was not feeling well so he came back to the emergency department for evaluation.  Patient denies any fever cough congestion.  Does state nausea but relates that to the withdrawal symptoms.    Past Medical History:  Diagnosis Date  . Alcohol abuse   . Asthma   . GERD (gastroesophageal reflux disease)   . Gout   . Hypertension   . Kidney stone   . OCD (obsessive compulsive disorder)   . Renal colic     Patient Active Problem List   Diagnosis Date Noted  . Breast lump or mass 10/04/2018  . Sepsis secondary to UTI ([place]) 09/14/2018  . Asthma 07/07/2018  . GERD (gastroesophageal reflux disease) 07/07/2018  . OCD (obsessive compulsive disorder) 07/07/2018  . Self-inflicted laceration of wrist 03/10/2018  . Leg hematoma 12/25/2017  . Suicide and self-inflicted injury by cutting and piercing instrument ([place]) 03/10/2017  . Severe recurrent major 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 ([place]) 03/09/2017  . Substance induced mood disorder ([place]) 08/15/2016  . Involuntary commitment 08/15/2016  . Alcohol use disorder, severe, dependence ([place]) 02/05/2016  . Hypertension 12/05/2015  . Tachycardia 12/05/2015  . Gout 11/13/2015  . Chronic  back pain 05/02/2015    Past Surgical History:  Procedure Laterality Date  . CHOLECYSTECTOMY  2012  . EXTRACORPOREAL SHOCK WAVE LITHOTRIPSY Left 01/12/2019   Procedure: EXTRACORPOREAL SHOCK WAVE LITHOTRIPSY (ESWL);  Surgeon: Billey Co, MD;  Location: [facility] ORS;  Service: Urology;  Laterality: Left;    Prior to Admission medications   Medication Sig Start Date End Date Taking? Authorizing Provider  albuterol (VENTOLIN HFA) 108 (90 Base) MCG/ACT inhaler Inhale 2 puffs into the lungs every 4 (four) hours as needed. 09/01/18   Tukov-Yual, Arlyss Gandy, NP  allopurinol (ZYLOPRIM) 300 MG tablet Take 1 tablet (300 mg total) by mouth daily. 09/01/18   Tukov-Yual, Arlyss Gandy, NP  chlordiazePOXIDE (LIBRIUM) 25 MG capsule Take 1 tablet by mouth 5 times a day on day 1, then decrease by 1 tablet daily until gone. 03/01/19   Earleen Newport, MD  docusate sodium (COLACE) 100 MG capsule Take 1 tablet once or twice daily as needed for constipation while taking narcotic pain medicine 01/09/19   Hinda Kehr, MD  Fluticasone-Salmeterol (ADVAIR DISKUS) 100-50 MCG/DOSE AEPB Inhale 1 puff into the lungs 2 (two) times daily. 09/01/18   Tukov-Yual, Arlyss Gandy, NP  folic acid (FOLVITE) 1 MG tablet Take 1 tablet (1 mg total) by mouth daily. 09/01/18   Tukov-Yual, Arlyss Gandy, NP  lisinopril (PRINIVIL,ZESTRIL) 20 MG tablet Take 1 tablet (20 mg total) by mouth daily. 02/21/19   Iloabachie, Chioma E, NP  Multiple Vitamins-Minerals (MULTIVITAMIN WITH MINERALS) tablet Take 1 tablet by mouth daily. 09/01/18  Tukov-Yual, Arlyss Gandy, NP  ondansetron (ZOFRAN ODT) 4 MG disintegrating tablet Allow 1-2 tablets to dissolve in your mouth every 8 hours as needed for nausea/vomiting Patient not taking: Reported on 03/01/2019 01/09/19   Hinda Kehr, MD  tiZANidine (ZANAFLEX) 4 MG tablet Take 0.5 tablets (2 mg total) by mouth every 8 (eight) hours as needed for muscle spasms. 02/21/19   Iloabachie, Chioma E, NP    Allergies   Allergen Reactions  . Percocet [Oxycodone-Acetaminophen] Nausea And Vomiting    Family History  Problem Relation Age of Onset  . Alcohol abuse Father   . Breast cancer Mother 43    Social History Social History   Tobacco Use  . Smoking status: Former Research scientist (life sciences)  . Smokeless tobacco: Never Used  . Tobacco comment: quit 30 years ago  Substance Use Topics  . Alcohol use: Yes    Alcohol/week: 7.0 standard drinks    Types: 7 Glasses of wine per week    Comment: wine earlier today  . Drug use: No    Review of Systems Constitutional: Negative for fever. Cardiovascular: Negative for chest pain. Respiratory: Negative for shortness of breath. Gastrointestinal: Negative for abdominal pain, vomiting Musculoskeletal: Negative for musculoskeletal complaints Skin: Negative for skin complaints  Neurological: Negative for headache All other ROS negative  ____________________________________________   PHYSICAL EXAM:  VITAL SIGNS: ED Triage Vitals  Enc Vitals Group     BP 03/02/19 1610 (!) 180/113     Pulse Rate 03/02/19 1610 (!) 124     Resp 03/02/19 1610 17     Temp 03/02/19 1610 98.1 F (36.7 C)     Temp src --      SpO2 03/02/19 1610 100 %     Weight 03/02/19 1616 185 lb (83.9 kg)     Height 03/02/19 1616 6' (1.829 m)     Head Circumference --      Peak Flow --      Pain Score --      Pain Loc --      Pain Edu? --      Excl. in [place]? --    Constitutional: Alert and oriented. Well appearing and in no distress. Eyes: Normal exam ENT      Head: Normocephalic and atraumatic.      Mouth/Throat: Mucous membranes are moist. Cardiovascular: Regular rhythm rate around 120. Respiratory: Normal respiratory effort without tachypnea nor retractions. Breath sounds are clear Gastrointestinal: Soft and nontender. No distention.  Musculoskeletal: Nontender with normal range of motion in all extremities.  Neurologic:  Normal speech and language. No gross focal neurologic  deficits Skin:  Skin is warm, dry and intact.  Psychiatric: Mood and affect are normal.   ____________________________________________   INITIAL IMPRESSION / ASSESSMENT AND PLAN / ED COURSE  Pertinent labs & imaging results that were available during my care of the patient were reviewed by me and considered in my medical decision making (see chart for details).   Patient presents to the emergency department not feeling well, states he could not sleep last night, states he has been shaking some today.  Patient has a history of alcoholism/alcohol abuse, states he last drank hard liquor 3 weeks ago, last drink beer 2 days ago.  Started Librium last night.  Patient does have signs of mild withdrawal including mild tachycardia, very slight tremor.  We will dose 2 mg of oral Ativan in the emergency department.  Patient states he does not want to stay in the emergency department, states he  needs to go home today.  I believe the patient would be safe to continue home detox with Librium as long as he is feeling better after Ativan.  I discussed with the patient the most difficult part of withdrawal from alcohol is the first 48 hours, and encouraged him to stick with it.  Patient agreeable to plan of care.  Patient is feeling better after Ativan, appears much more relaxed.  Patient continues to have mild tachycardia and hypertension but otherwise reassuring vitals.  Patient wishes to go home.  We will discharge home he will continue his Librium taper and follow-up with his doctor.  Patient agreeable to plan of care.  Miguel [name] was evaluated in Emergency Department on 03/02/2019 for the symptoms described in the history of present illness. He was evaluated in the context of the global COVID-19 pandemic, which necessitated consideration that the patient might be at risk for infection with the SARS-CoV-2 virus that causes COVID-19. Institutional protocols and algorithms that pertain to the evaluation of patients  at risk for COVID-19 are in a state of rapid change based on information released by regulatory bodies including the CDC and federal and state organizations. These policies and algorithms were followed during the patient's care in the ED.  ____________________________________________   FINAL CLINICAL IMPRESSION(S) / ED DIAGNOSES  Alcohol withdrawal   Harvest Dark, MD 03/02/19 1639    Harvest Dark, MD 03/02/19 1806

## 2019-03-02 NOTE — ED Notes (Signed)
Patient assigned to appropriate care area   Introduced self to pt  Patient oriented to unit/care area: Informed that, for their safety, care areas are designed for safety and visiting and phone hours explained to patient. Patient verbalizes understanding, and verbal contract for safety obtained  Environment secured   Patient stated he was here because he needed to get his medications adjusted he was discharged with Librium to take at tapering doses for a few days.

## 2019-03-02 NOTE — ED Triage Notes (Signed)
Patient stated he was here because he needed to get his medications adjusted he was discharged yesterday with Librium to take at tapering doses for a few days, and he said he feels the medication made him stay up all night, and he continues to have no appetitie. "Im not sure that medication is working". Patient is shaky, and sweaty. Patient denies having anything to drink since his discharge on yesterday 03/01/2019. 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 Patient denies any diarrhea, vomiting, or seizures

## 2019-03-02 NOTE — ED Notes (Signed)
Patient eating dinner.

## 2019-03-02 NOTE — ED Notes (Signed)
Patient discharged, patient is taking [place] home, patient received discharge papers. Patient received belongings and verbalized he has received all of his belongings. Patient appropriate and cooperative, Denies SI/HI AVH. Vital signs taken. NAD noted.

## 2019-03-02 NOTE — ED Notes (Signed)
Per MD Lennette Bihari, no labs needed patient was just here on 03/01/2019 and discharged the same day

## 2019-03-07 ENCOUNTER — Other Ambulatory Visit: Payer: Self-pay

## 2019-03-07 ENCOUNTER — Ambulatory Visit: Payer: No Typology Code available for payment source | Admitting: Licensed Clinical Social Worker

## 2019-03-07 DIAGNOSIS — F1020 Alcohol dependence, uncomplicated: Secondary | ICD-10-CM

## 2019-03-08 ENCOUNTER — Emergency Department: Payer: Self-pay

## 2019-03-08 ENCOUNTER — Inpatient Hospital Stay: Payer: Self-pay

## 2019-03-08 ENCOUNTER — Inpatient Hospital Stay
Admission: EM | Admit: 2019-03-08 | Discharge: 2019-03-09 | DRG: 872 | Disposition: A | Discharge status: Home / Self Care | Payer: Self-pay | Attending: Internal Medicine | Admitting: Internal Medicine

## 2019-03-08 ENCOUNTER — Other Ambulatory Visit: Payer: Self-pay

## 2019-03-08 DIAGNOSIS — F429 Obsessive-compulsive disorder, unspecified: Secondary | ICD-10-CM | POA: Diagnosis present

## 2019-03-08 DIAGNOSIS — R52 Pain, unspecified: Secondary | ICD-10-CM

## 2019-03-08 DIAGNOSIS — E876 Hypokalemia: Secondary | ICD-10-CM | POA: Diagnosis present

## 2019-03-08 DIAGNOSIS — K219 Gastro-esophageal reflux disease without esophagitis: Secondary | ICD-10-CM | POA: Diagnosis present

## 2019-03-08 DIAGNOSIS — J45909 Unspecified asthma, uncomplicated: Secondary | ICD-10-CM | POA: Diagnosis present

## 2019-03-08 DIAGNOSIS — F10129 Alcohol abuse with intoxication, unspecified: Secondary | ICD-10-CM | POA: Diagnosis present

## 2019-03-08 DIAGNOSIS — R1084 Generalized abdominal pain: Secondary | ICD-10-CM

## 2019-03-08 DIAGNOSIS — Z7951 Long term (current) use of inhaled steroids: Secondary | ICD-10-CM

## 2019-03-08 DIAGNOSIS — Z9119 Patient's noncompliance with other medical treatment and regimen: Secondary | ICD-10-CM

## 2019-03-08 DIAGNOSIS — I959 Hypotension, unspecified: Secondary | ICD-10-CM

## 2019-03-08 DIAGNOSIS — Z87891 Personal history of nicotine dependence: Secondary | ICD-10-CM

## 2019-03-08 DIAGNOSIS — M109 Gout, unspecified: Secondary | ICD-10-CM | POA: Diagnosis present

## 2019-03-08 DIAGNOSIS — M25531 Pain in right wrist: Secondary | ICD-10-CM | POA: Diagnosis present

## 2019-03-08 DIAGNOSIS — I10 Essential (primary) hypertension: Secondary | ICD-10-CM | POA: Diagnosis present

## 2019-03-08 DIAGNOSIS — Z9049 Acquired absence of other specified parts of digestive tract: Secondary | ICD-10-CM

## 2019-03-08 DIAGNOSIS — A419 Sepsis, unspecified organism: Principal | ICD-10-CM | POA: Diagnosis present

## 2019-03-08 DIAGNOSIS — R7989 Other specified abnormal findings of blood chemistry: Secondary | ICD-10-CM

## 2019-03-08 DIAGNOSIS — F10929 Alcohol use, unspecified with intoxication, unspecified: Secondary | ICD-10-CM

## 2019-03-08 LAB — CBC WITH DIFFERENTIAL/PLATELET
Abs Immature Granulocytes: 0.02 10*3/uL (ref 0.00–0.07)
Basophils Absolute: 0.1 10*3/uL (ref 0.0–0.1)
Basophils Relative: 1 %
Eosinophils Absolute: 0.1 10*3/uL (ref 0.0–0.5)
Eosinophils Relative: 2 %
HCT: 36.2 % — ABNORMAL LOW (ref 39.0–52.0)
Hemoglobin: 11.4 g/dL — ABNORMAL LOW (ref 13.0–17.0)
Immature Granulocytes: 0 %
Lymphocytes Relative: 38 %
Lymphs Abs: 2.4 10*3/uL (ref 0.7–4.0)
MCH: 27.5 pg (ref 26.0–34.0)
MCHC: 31.5 g/dL (ref 30.0–36.0)
MCV: 87.4 fL (ref 80.0–100.0)
Monocytes Absolute: 0.7 10*3/uL (ref 0.1–1.0)
Monocytes Relative: 11 %
Neutro Abs: 3.1 10*3/uL (ref 1.7–7.7)
Neutrophils Relative %: 48 %
Platelets: 141 10*3/uL — ABNORMAL LOW (ref 150–400)
RBC: 4.14 MIL/uL — ABNORMAL LOW (ref 4.22–5.81)
RDW: 20.7 % — ABNORMAL HIGH (ref 11.5–15.5)
WBC: 6.3 10*3/uL (ref 4.0–10.5)
nRBC: 0 % (ref 0.0–0.2)

## 2019-03-08 LAB — URINALYSIS, COMPLETE (UACMP) WITH MICROSCOPIC
Bacteria, UA: NONE SEEN
Bilirubin Urine: NEGATIVE
Glucose, UA: NEGATIVE mg/dL
Hgb urine dipstick: NEGATIVE
Ketones, ur: NEGATIVE mg/dL
Leukocytes,Ua: NEGATIVE
Nitrite: NEGATIVE
Protein, ur: NEGATIVE mg/dL
Specific Gravity, Urine: 1.002 — ABNORMAL LOW (ref 1.005–1.030)
Squamous Epithelial / HPF: NONE SEEN (ref 0–5)
pH: 7 (ref 5.0–8.0)

## 2019-03-08 LAB — COMPREHENSIVE METABOLIC PANEL
ALT: 12 U/L (ref 0–44)
AST: 13 U/L — ABNORMAL LOW (ref 15–41)
Albumin: 3.4 g/dL — ABNORMAL LOW (ref 3.5–5.0)
Alkaline Phosphatase: 56 U/L (ref 38–126)
Anion gap: 9 (ref 5–15)
BUN: 12 mg/dL (ref 6–20)
CO2: 23 mmol/L (ref 22–32)
Calcium: 8.6 mg/dL — ABNORMAL LOW (ref 8.9–10.3)
Chloride: 110 mmol/L (ref 98–111)
Creatinine, Ser: 1.26 mg/dL — ABNORMAL HIGH (ref 0.61–1.24)
GFR calc Af Amer: >60 mL/min (ref >60)
GFR calc non Af Amer: >60 mL/min (ref >60)
Glucose, Bld: 129 mg/dL — ABNORMAL HIGH (ref 70–99)
Potassium: 3.2 mmol/L — ABNORMAL LOW (ref 3.5–5.1)
Sodium: 142 mmol/L (ref 135–145)
Total Bilirubin: 0.4 mg/dL (ref 0.3–1.2)
Total Protein: 6.2 g/dL — ABNORMAL LOW (ref 6.5–8.1)

## 2019-03-08 LAB — SALICYLATE LEVEL: Salicylate Lvl: <7 mg/dL (ref 2.8–30.0)

## 2019-03-08 LAB — URINE DRUG SCREEN, QUALITATIVE (ARMC ONLY)
Amphetamines, Ur Screen: NOT DETECTED
Barbiturates, Ur Screen: NOT DETECTED
Benzodiazepine, Ur Scrn: POSITIVE — AB
Cannabinoid 50 Ng, Ur ~~LOC~~: NOT DETECTED
Cocaine Metabolite,Ur ~~LOC~~: NOT DETECTED
MDMA (Ecstasy)Ur Screen: NOT DETECTED
Methadone Scn, Ur: NOT DETECTED
Opiate, Ur Screen: NOT DETECTED
Phencyclidine (PCP) Ur S: NOT DETECTED
Tricyclic, Ur Screen: NOT DETECTED

## 2019-03-08 LAB — TROPONIN I: Troponin I: <0.03 ng/mL (ref <0.03)

## 2019-03-08 LAB — ACETAMINOPHEN LEVEL: Acetaminophen (Tylenol), Serum: <10 ug/mL — ABNORMAL LOW (ref 10–30)

## 2019-03-08 LAB — LACTIC ACID, PLASMA
Lactic Acid, Venous: 2.8 mmol/L (ref 0.5–1.9)
Lactic Acid, Venous: 2.6 mmol/L (ref 0.5–1.9)
Lactic Acid, Venous: 2.1 mmol/L (ref 0.5–1.9)
Lactic Acid, Venous: 2 mmol/L (ref 0.5–1.9)

## 2019-03-08 LAB — ETHANOL: Alcohol, Ethyl (B): 301 mg/dL (ref <10)

## 2019-03-08 LAB — LIPASE, BLOOD: Lipase: 47 U/L (ref 11–51)

## 2019-03-08 LAB — CK: Total CK: 18 U/L — ABNORMAL LOW (ref 49–397)

## 2019-03-08 LAB — BRAIN NATRIURETIC PEPTIDE: B Natriuretic Peptide: 92 pg/mL (ref 0.0–100.0)

## 2019-03-08 MED ORDER — MOMETASONE FURO-FORMOTEROL FUM 100-5 MCG/ACT IN AERO
2.0000 | INHALATION_SPRAY | Freq: Two times a day (BID) | RESPIRATORY_TRACT | Status: DC
  Administered 2019-03-08 – 2019-03-09 (×2): 2 via RESPIRATORY_TRACT
  Filled 2019-03-08: qty 8.8

## 2019-03-08 MED ORDER — THIAMINE HCL 100 MG/ML IJ SOLN
100.0000 mg | Freq: Once | INTRAMUSCULAR | Status: AC
  Administered 2019-03-08: 100 mg via INTRAVENOUS
  Filled 2019-03-08: qty 2

## 2019-03-08 MED ORDER — SODIUM CHLORIDE 0.9 % IV SOLN
INTRAVENOUS | Status: DC
  Administered 2019-03-08 – 2019-03-09 (×3): via INTRAVENOUS

## 2019-03-08 MED ORDER — ALLOPURINOL 300 MG PO TABS
300.0000 mg | ORAL_TABLET | Freq: Every day | ORAL | Status: DC
  Administered 2019-03-09: 300 mg via ORAL
  Filled 2019-03-08: qty 3
  Filled 2019-03-08 (×2): qty 1

## 2019-03-08 MED ORDER — FOLIC ACID 1 MG PO TABS
1.0000 mg | ORAL_TABLET | Freq: Every day | ORAL | Status: DC
  Administered 2019-03-09: 1 mg via ORAL
  Filled 2019-03-08: qty 1

## 2019-03-08 MED ORDER — SODIUM CHLORIDE 0.9 % IV SOLN
1.0000 g | INTRAVENOUS | Status: AC
  Administered 2019-03-08: 1 g via INTRAVENOUS
  Filled 2019-03-08: qty 10

## 2019-03-08 MED ORDER — ENOXAPARIN SODIUM 40 MG/0.4ML ~~LOC~~ SOLN
40.0000 mg | SUBCUTANEOUS | Status: DC
  Administered 2019-03-08: 40 mg via SUBCUTANEOUS
  Filled 2019-03-08: qty 0.4

## 2019-03-08 MED ORDER — ACETAMINOPHEN 325 MG PO TABS
650.0000 mg | ORAL_TABLET | Freq: Four times a day (QID) | ORAL | Status: DC | PRN
  Administered 2019-03-08 – 2019-03-09 (×2): 650 mg via ORAL
  Filled 2019-03-08 (×2): qty 2

## 2019-03-08 MED ORDER — LORAZEPAM 1 MG PO TABS
1.0000 mg | ORAL_TABLET | Freq: Four times a day (QID) | ORAL | Status: DC | PRN
  Administered 2019-03-08 (×2): 1 mg via ORAL
  Filled 2019-03-08 (×2): qty 1

## 2019-03-08 MED ORDER — VITAMIN B-1 100 MG PO TABS
100.0000 mg | ORAL_TABLET | Freq: Every day | ORAL | Status: DC
  Administered 2019-03-09: 100 mg via ORAL
  Filled 2019-03-08: qty 1

## 2019-03-08 MED ORDER — POTASSIUM CHLORIDE 20 MEQ PO PACK
40.0000 meq | PACK | Freq: Once | ORAL | Status: AC
  Administered 2019-03-08: 40 meq via ORAL
  Filled 2019-03-08: qty 2

## 2019-03-08 MED ORDER — VANCOMYCIN HCL 1000 MG IV SOLR
1000.0000 mg | Freq: Two times a day (BID) | INTRAVENOUS | Status: DC
  Filled 2019-03-08: qty 1000

## 2019-03-08 MED ORDER — LORAZEPAM 2 MG/ML IJ SOLN: 1.0000 mg | Freq: Four times a day (QID) | INTRAMUSCULAR | Status: DC | PRN

## 2019-03-08 MED ORDER — VANCOMYCIN HCL IN DEXTROSE 1-5 GM/200ML-% IV SOLN
1000.0000 mg | Freq: Two times a day (BID) | INTRAVENOUS | Status: DC
  Administered 2019-03-08 – 2019-03-09 (×2): 1000 mg via INTRAVENOUS
  Filled 2019-03-08 (×3): qty 200

## 2019-03-08 MED ORDER — CHLORDIAZEPOXIDE HCL 25 MG PO CAPS
25.0000 mg | ORAL_CAPSULE | Freq: Three times a day (TID) | ORAL | Status: DC
  Administered 2019-03-08 – 2019-03-09 (×2): 25 mg via ORAL
  Filled 2019-03-08 (×2): qty 1

## 2019-03-08 MED ORDER — SODIUM CHLORIDE 0.9 % IV SOLN
500.0000 mg | Freq: Once | INTRAVENOUS | Status: AC
  Administered 2019-03-08: 500 mg via INTRAVENOUS
  Filled 2019-03-08: qty 500

## 2019-03-08 MED ORDER — IOHEXOL 300 MG/ML  SOLN
100.0000 mL | Freq: Once | INTRAMUSCULAR | Status: AC | PRN
  Administered 2019-03-08: 100 mL via INTRAVENOUS

## 2019-03-08 MED ORDER — VANCOMYCIN HCL 10 G IV SOLR
2000.0000 mg | Freq: Once | INTRAVENOUS | Status: AC
  Administered 2019-03-08: 2000 mg via INTRAVENOUS
  Filled 2019-03-08: qty 2000

## 2019-03-08 MED ORDER — TIZANIDINE HCL 2 MG PO TABS
2.0000 mg | ORAL_TABLET | Freq: Three times a day (TID) | ORAL | Status: DC | PRN
  Filled 2019-03-08: qty 1

## 2019-03-08 MED ORDER — LACTULOSE 10 GM/15ML PO SOLN: 20.0000 g | ORAL | Status: DC

## 2019-03-08 MED ORDER — ALBUTEROL SULFATE HFA 108 (90 BASE) MCG/ACT IN AERS: 2.0000 | INHALATION_SPRAY | RESPIRATORY_TRACT | Status: DC | PRN

## 2019-03-08 MED ORDER — ONDANSETRON HCL 4 MG/2ML IJ SOLN: 4.0000 mg | Freq: Four times a day (QID) | INTRAMUSCULAR | Status: DC | PRN

## 2019-03-08 MED ORDER — ADULT MULTIVITAMIN W/MINERALS CH
1.0000 | ORAL_TABLET | Freq: Every day | ORAL | Status: DC
  Administered 2019-03-09: 1 via ORAL
  Filled 2019-03-08: qty 1

## 2019-03-08 MED ORDER — SODIUM CHLORIDE 0.9 % IV BOLUS
1000.0000 mL | Freq: Once | INTRAVENOUS | Status: AC
  Administered 2019-03-08: 1000 mL via INTRAVENOUS

## 2019-03-08 MED ORDER — ALBUTEROL SULFATE (2.5 MG/3ML) 0.083% IN NEBU: 2.5000 mg | INHALATION_SOLUTION | RESPIRATORY_TRACT | Status: DC | PRN

## 2019-03-08 MED ORDER — SODIUM CHLORIDE 0.9 % IV SOLN
2.0000 g | Freq: Three times a day (TID) | INTRAVENOUS | Status: DC
  Administered 2019-03-08 – 2019-03-09 (×4): 2 g via INTRAVENOUS
  Filled 2019-03-08 (×8): qty 2

## 2019-03-08 MED ORDER — DOCUSATE SODIUM 100 MG PO CAPS
100.0000 mg | ORAL_CAPSULE | Freq: Every day | ORAL | Status: DC
  Administered 2019-03-09: 100 mg via ORAL
  Filled 2019-03-08: qty 1

## 2019-03-08 NOTE — ED Notes (Signed)
Pt now stated that he drunk pint of liquor.

## 2019-03-08 NOTE — H&P (Signed)
[facility] NAME: Miguel [name]    MR#:  [id number]  DATE OF BIRTH:  February 23, 1961  DATE OF ADMISSION:  03/08/2019  PRIMARY CARE PHYSICIAN: System, Pcp Not In   REQUESTING/REFERRING PHYSICIAN: Dr. Karma Greaser  CHIEF COMPLAINT:   Chief Complaint  Patient presents with   Alcohol Intoxication   Fall    HISTORY OF PRESENT ILLNESS:  Miguel [name]  is a 58 y.o. male with a known history of alcohol abuse,GERD, hypertension, OCD, gout, asthma, and renal colic presenting to the ED for fall and possible DTs.  Patient reports that he was drinking a bottle of wine a day and a pint of vodka a day.  He stopped drinking 3 weeks ago however noticed that he has been having increased tremors so he started drinking again. Patient reports that he wants to get help and be able to take care of his wife who has MS.  Patient reports that he fell last evening hitting the back of his head.  He does not recall how long he was on the floor.  He reports that he may have lost consciousness but is unsure how long.  His wife called EMS transported him to the ED.  Patient has had several ER visit for alcohol withdrawal and minor complaints in the past.  On arrival to the ED, he was noted to be hypothermic with temp. of 36.1 and hypotensive with blood pressure 73/76 mm Hg and pulse rate 81 beats/min. There were no focal neurological deficits; he was initially difficult to arouse but was still able to follow commands.  Initial labs revealed potassium 3.2, creatinine 1.26, albumin 3.4, elevated ethanol levels of 301, lactic acid 2.8, UDS positive for benzo. Chest CT showed lower lung volumes with dependent pulmonary opacity that most resembles atelectasis. A non-contrast head CT showed no acute intracranial abnormality.  CT cervical spine did not show any fractures or abnormality.  PAST MEDICAL HISTORY:   Past Medical History:  Diagnosis Date   Alcohol abuse    Asthma    GERD  (gastroesophageal reflux disease)    Gout    Hypertension    Kidney stone    OCD (obsessive compulsive disorder)    Renal colic     PAST SURGICAL HISTORY:   Past Surgical History:  Procedure Laterality Date   CHOLECYSTECTOMY  2012   EXTRACORPOREAL SHOCK WAVE LITHOTRIPSY Left 01/12/2019   Procedure: EXTRACORPOREAL SHOCK WAVE LITHOTRIPSY (ESWL);  Surgeon: Billey Co, MD;  Location: [facility] ORS;  Service: Urology;  Laterality: Left;    SOCIAL HISTORY:   Social History   Tobacco Use   Smoking status: Former Smoker   Smokeless tobacco: Never Used   Tobacco comment: quit 30 years ago  Substance Use Topics   Alcohol use: Yes    Alcohol/week: 7.0 standard drinks    Types: 7 Glasses of wine per week    Comment: wine earlier today    FAMILY HISTORY:   Family History  Problem Relation Age of Onset   Alcohol abuse Father    Breast cancer Mother 65    DRUG ALLERGIES:   Allergies  Allergen Reactions   Percocet [Oxycodone-Acetaminophen] Nausea And Vomiting    REVIEW OF SYSTEMS:   Review of Systems  Constitutional: Negative for chills, fever, malaise/fatigue and weight loss.  HENT: Negative for congestion, hearing loss and sore throat.   Eyes: Negative for blurred vision and double vision.  Respiratory: Negative for cough, shortness of  breath and wheezing.   Cardiovascular: Negative for chest pain, palpitations, orthopnea and leg swelling.  Gastrointestinal: Negative for abdominal pain, diarrhea, nausea and vomiting.  Genitourinary: Negative for dysuria and urgency.  Musculoskeletal: Negative for myalgias.  Skin: Negative for rash.  Neurological: Negative for dizziness, sensory change, speech change, focal weakness and headaches.  Psychiatric/Behavioral: Negative for depression.   MEDICATIONS AT HOME:   Prior to Admission medications   Medication Sig Start Date End Date Taking? Authorizing Provider  albuterol (VENTOLIN HFA) 108 (90 Base) MCG/ACT  inhaler Inhale 2 puffs into the lungs every 4 (four) hours as needed. Patient taking differently: Inhale 2 puffs into the lungs every 4 (four) hours as needed for wheezing or shortness of breath.  09/01/18  Yes Tukov-Yual, Arlyss Gandy, NP  allopurinol (ZYLOPRIM) 300 MG tablet Take 1 tablet (300 mg total) by mouth daily. 09/01/18  Yes Tukov-Yual, Arlyss Gandy, NP  chlordiazePOXIDE (LIBRIUM) 25 MG capsule Take 1 tablet by mouth 5 times a day on day 1, then decrease by 1 tablet daily until gone. 03/01/19  Yes Earleen Newport, MD  docusate sodium (COLACE) 100 MG capsule Take 1 tablet once or twice daily as needed for constipation while taking narcotic pain medicine 01/09/19  Yes Hinda Kehr, MD  Fluticasone-Salmeterol (ADVAIR DISKUS) 100-50 MCG/DOSE AEPB Inhale 1 puff into the lungs 2 (two) times daily. 09/01/18  Yes Tukov-Yual, Arlyss Gandy, NP  folic acid (FOLVITE) 1 MG tablet Take 1 tablet (1 mg total) by mouth daily. 09/01/18  Yes Tukov-Yual, Magdalene S, NP  lisinopril (PRINIVIL,ZESTRIL) 20 MG tablet Take 1 tablet (20 mg total) by mouth daily. 02/21/19  Yes Iloabachie, Chioma E, NP  Multiple Vitamins-Minerals (MULTIVITAMIN WITH MINERALS) tablet Take 1 tablet by mouth daily. 09/01/18  Yes Tukov-Yual, Magdalene S, NP  tiZANidine (ZANAFLEX) 4 MG tablet Take 0.5 tablets (2 mg total) by mouth every 8 (eight) hours as needed for muscle spasms. 02/21/19  Yes Iloabachie, Chioma E, NP  ondansetron (ZOFRAN ODT) 4 MG disintegrating tablet Allow 1-2 tablets to dissolve in your mouth every 8 hours as needed for nausea/vomiting Patient not taking: Reported on 03/01/2019 01/09/19   Hinda Kehr, MD      VITAL SIGNS:  Blood pressure (!) 131/91, pulse 81, temperature (!) 97.3 F (36.3 C), temperature source Oral, resp. rate 20, height 6' (1.829 m), weight 83.9 kg, SpO2 100 %.  PHYSICAL EXAMINATION:   GENERAL:  58 y.o.-year-old patient lying in the bed with no acute distress.  EYES: Pupils equal, round, reactive  to light and accommodation. No scleral icterus. Extraocular muscles intact.  HEENT: Head atraumatic, normocephalic. Oropharynx and nasopharynx clear.  NECK:  Supple, no jugular venous distention. No thyroid enlargement, no tenderness.  LUNGS: Normal breath sounds bilaterally, no wheezing, rales,rhonchi or crepitation. No use of accessory muscles of respiration.  CARDIOVASCULAR: S1, S2 normal. No murmurs, rubs, or gallops.  ABDOMEN: Soft, nontender, nondistended. Bowel sounds present. No organomegaly or mass.  EXTREMITIES: No pedal edema, cyanosis, or clubbing. Bilateral bruising arms NEUROLOGIC: Mental Status:Alert, oriented, thought content appropriate.  Speech fluent without evidence of aphasia.  Able to follow 3 step commands without difficulty. Attention span and concentration seemed appropriate  Cranial Nerves: II: Discs flat bilaterally; Visual fields grossly normal, pupils equal, round, reactive to light and accommodation III,IV, VI: ptosis not present, extra-ocular motions intact bilaterally V,VII: smile symmetric, facial light touch sensation intact VIII: hearing normal bilaterally IX,X: gag reflex present XI: bilateral shoulder shrug XII: midline tongue extension Muscle strength 5/5 in  all extremities.  Tone and bulk:normal tone throughout; no atrophy noted Sensory: Pinprick and light touch intact bilaterally Deep Tendon Reflexes: 2+ and symmetric throughout Cerebellar:Absent ataxia Gait: not tested due to safety concerns PSYCHIATRIC: The patient is alert and oriented x 3.  SKIN: No obvious rash, lesion, or ulcer.   DATA REVIEWED:  LABORATORY PANEL:   CBC Recent Labs  Lab 03/08/19 0110  WBC 6.3  HGB 11.4*  HCT 36.2*  PLT 141*   ------------------------------------------------------------------------------------------------------------------  Chemistries  Recent Labs  Lab 03/08/19 0110  NA 142  K 3.2*  CL 110  CO2 23  GLUCOSE 129*  BUN 12  CREATININE 1.26*    CALCIUM 8.6*  AST 13*  ALT 12  ALKPHOS 56  BILITOT 0.4   ------------------------------------------------------------------------------------------------------------------  Cardiac Enzymes Recent Labs  Lab 03/08/19 0110  TROPONINI <0.03   ------------------------------------------------------------------------------------------------------------------  RADIOLOGY:  Ct Head Wo Contrast  Result Date: 03/08/2019 CLINICAL DATA:  Ataxia, head injury. EXAM: CT HEAD WITHOUT CONTRAST CT CERVICAL SPINE WITHOUT CONTRAST TECHNIQUE: Multidetector CT imaging of the head and cervical spine was performed following the standard protocol without intravenous contrast. Multiplanar CT image reconstructions of the cervical spine were also generated. COMPARISON:  CT scan of February 12, 2018. FINDINGS: CT HEAD FINDINGS Brain: Mild diffuse cortical atrophy is noted. Minimal chronic ischemic white matter disease is noted. No mass effect or midline shift is noted. Ventricular size is within normal limits. There is no evidence of mass lesion, hemorrhage or acute infarction. Vascular: No hyperdense vessel or unexpected calcification. Skull: Normal. Negative for fracture or focal lesion. Sinuses/Orbits: No acute finding. Other: None. CT CERVICAL SPINE FINDINGS Alignment: Normal. Skull base and vertebrae: No acute fracture. No primary bone lesion or focal pathologic process. Soft tissues and spinal canal: No prevertebral fluid or swelling. No visible canal hematoma. Disc levels: Mild degenerative disc disease is noted at C5-6 and C6-7 with anterior osteophyte formation. Upper chest: Negative. Other: None. IMPRESSION: Mild diffuse cortical atrophy. Minimal chronic ischemic white matter disease. No acute intracranial abnormality seen. Mild multilevel degenerative disc disease. No acute abnormality seen in the cervical spine. Electronically Signed   By: Marijo Conception M.D.   On: 03/08/2019 08:28   Ct Chest W Contrast  Result  Date: 03/08/2019 CLINICAL DATA:  58 year old male status post fall. EXAM: CT CHEST, ABDOMEN, AND PELVIS WITH CONTRAST TECHNIQUE: Multidetector CT imaging of the chest, abdomen and pelvis was performed following the standard protocol during bolus administration of intravenous contrast. CONTRAST:  115mL OMNIPAQUE IOHEXOL 300 MG/ML  SOLN COMPARISON:  Portable chest x-ray earlier today. Chest CT 08/18/2018. CT Abdomen and Pelvis 01/20/2019 and earlier. FINDINGS: CT CHEST FINDINGS Cardiovascular: Intact thoracic aorta. Mild Calcified aortic atherosclerosis. Cardiac size at the upper limits of normal to mildly increased. No pericardial effusion. Mediastinum/Nodes: Negative. No mediastinal hematoma or lymphadenopathy. Lungs/Pleura: Lower lung volumes with bilateral dependent pulmonary opacity. The opacity is fairly confluent in the lower lobes, but enhances and still most resembles atelectasis. Stable thickening along the confluence of the right major and minor fissures on series 4, image 72. No pneumothorax, pleural effusion, or pulmonary contusion. Musculoskeletal: No rib fracture identified. Intact sternum. Thoracic vertebrae appear stable and intact. Visible shoulder structures appear intact. No acute osseous abnormality identified. No superficial soft tissue injury identified. CT ABDOMEN PELVIS FINDINGS Hepatobiliary: Evidence of hepatic steatosis. The gallbladder appears to be absent. Pancreas: Negative. Spleen: Negative. Adrenals/Urinary Tract: Normal adrenal glands. Bilateral nephrolithiasis, regressed on the left since October, and stable since March. Symmetric  renal enhancement and contrast excretion. Stable ureters. Distended urinary bladder today. Stomach/Bowel: Stable rectosigmoid colon since October. Mild nonspecific wall thickening throughout. Proximal sigmoid diverticulosis. No definite active inflammation. More normal appearance of the descending and transverse colon. Mild fatty proliferation along the  wall of the right colon. Negative appendix and terminal ileum. No dilated small bowel. Negative stomach. Incidental duodenum diverticula are stable. No free air, free fluid. Vascular/Lymphatic: Mild Calcified aortic atherosclerosis. Major arterial structures are patent and intact. There is distal iliac and proximal femoral artery atherosclerosis. Portal venous system demonstrates early contrast timing but appears to be patent. No lymphadenopathy. Reproductive: Negative. Other: No pelvic free fluid. Musculoskeletal: Normal lumbar segmentation. Intact lumbar vertebrae. Sacrum, SI joints, pelvis, and proximal femurs appear intact. IMPRESSION: 1. No acute traumatic injury identified in the chest abdomen or pelvis. 2. Lower lung volumes with dependent pulmonary opacity that most resembles atelectasis. 3. Stable nephrolithiasis.  Distended urinary bladder today. 4. Hepatic steatosis. Sigmoid diverticulosis. Chronic rectosigmoid wall thickening which is stable since 2019 and might be postinflammatory. Electronically Signed   By: Genevie Ann M.D.   On: 03/08/2019 05:57   Ct Cervical Spine Wo Contrast  Result Date: 03/08/2019 CLINICAL DATA:  Ataxia, head injury. EXAM: CT HEAD WITHOUT CONTRAST CT CERVICAL SPINE WITHOUT CONTRAST TECHNIQUE: Multidetector CT imaging of the head and cervical spine was performed following the standard protocol without intravenous contrast. Multiplanar CT image reconstructions of the cervical spine were also generated. COMPARISON:  CT scan of February 12, 2018. FINDINGS: CT HEAD FINDINGS Brain: Mild diffuse cortical atrophy is noted. Minimal chronic ischemic white matter disease is noted. No mass effect or midline shift is noted. Ventricular size is within normal limits. There is no evidence of mass lesion, hemorrhage or acute infarction. Vascular: No hyperdense vessel or unexpected calcification. Skull: Normal. Negative for fracture or focal lesion. Sinuses/Orbits: No acute finding. Other: None. CT  CERVICAL SPINE FINDINGS Alignment: Normal. Skull base and vertebrae: No acute fracture. No primary bone lesion or focal pathologic process. Soft tissues and spinal canal: No prevertebral fluid or swelling. No visible canal hematoma. Disc levels: Mild degenerative disc disease is noted at C5-6 and C6-7 with anterior osteophyte formation. Upper chest: Negative. Other: None. IMPRESSION: Mild diffuse cortical atrophy. Minimal chronic ischemic white matter disease. No acute intracranial abnormality seen. Mild multilevel degenerative disc disease. No acute abnormality seen in the cervical spine. Electronically Signed   By: Marijo Conception M.D.   On: 03/08/2019 08:28   Ct Abdomen Pelvis W Contrast  Result Date: 03/08/2019 CLINICAL DATA:  58 year old male status post fall. EXAM: CT CHEST, ABDOMEN, AND PELVIS WITH CONTRAST TECHNIQUE: Multidetector CT imaging of the chest, abdomen and pelvis was performed following the standard protocol during bolus administration of intravenous contrast. CONTRAST:  184mL OMNIPAQUE IOHEXOL 300 MG/ML  SOLN COMPARISON:  Portable chest x-ray earlier today. Chest CT 08/18/2018. CT Abdomen and Pelvis 01/20/2019 and earlier. FINDINGS: CT CHEST FINDINGS Cardiovascular: Intact thoracic aorta. Mild Calcified aortic atherosclerosis. Cardiac size at the upper limits of normal to mildly increased. No pericardial effusion. Mediastinum/Nodes: Negative. No mediastinal hematoma or lymphadenopathy. Lungs/Pleura: Lower lung volumes with bilateral dependent pulmonary opacity. The opacity is fairly confluent in the lower lobes, but enhances and still most resembles atelectasis. Stable thickening along the confluence of the right major and minor fissures on series 4, image 72. No pneumothorax, pleural effusion, or pulmonary contusion. Musculoskeletal: No rib fracture identified. Intact sternum. Thoracic vertebrae appear stable and intact. Visible shoulder structures appear intact. No  acute osseous abnormality  identified. No superficial soft tissue injury identified. CT ABDOMEN PELVIS FINDINGS Hepatobiliary: Evidence of hepatic steatosis. The gallbladder appears to be absent. Pancreas: Negative. Spleen: Negative. Adrenals/Urinary Tract: Normal adrenal glands. Bilateral nephrolithiasis, regressed on the left since October, and stable since March. Symmetric renal enhancement and contrast excretion. Stable ureters. Distended urinary bladder today. Stomach/Bowel: Stable rectosigmoid colon since October. Mild nonspecific wall thickening throughout. Proximal sigmoid diverticulosis. No definite active inflammation. More normal appearance of the descending and transverse colon. Mild fatty proliferation along the wall of the right colon. Negative appendix and terminal ileum. No dilated small bowel. Negative stomach. Incidental duodenum diverticula are stable. No free air, free fluid. Vascular/Lymphatic: Mild Calcified aortic atherosclerosis. Major arterial structures are patent and intact. There is distal iliac and proximal femoral artery atherosclerosis. Portal venous system demonstrates early contrast timing but appears to be patent. No lymphadenopathy. Reproductive: Negative. Other: No pelvic free fluid. Musculoskeletal: Normal lumbar segmentation. Intact lumbar vertebrae. Sacrum, SI joints, pelvis, and proximal femurs appear intact. IMPRESSION: 1. No acute traumatic injury identified in the chest abdomen or pelvis. 2. Lower lung volumes with dependent pulmonary opacity that most resembles atelectasis. 3. Stable nephrolithiasis.  Distended urinary bladder today. 4. Hepatic steatosis. Sigmoid diverticulosis. Chronic rectosigmoid wall thickening which is stable since 2019 and might be postinflammatory. Electronically Signed   By: Genevie Ann M.D.   On: 03/08/2019 05:57   Dg Chest Portable 1 View  Result Date: 03/08/2019 CLINICAL DATA:  Cough EXAM: PORTABLE CHEST 1 VIEW COMPARISON:  11/20/2018 FINDINGS: Bibasilar atelectasis  without focal consolidation. No pleural effusion or pneumothorax. Heart size is normal. IMPRESSION: Bibasilar atelectasis. Electronically Signed   By: Ulyses Jarred M.D.   On: 03/08/2019 03:17    EKG:  EKG: normal EKG, normal sinus rhythm, unchanged from previous tracings. Vent. rate 72 BPM PR interval * ms QRS duration 92 ms QT/QTc 427/468 ms P-R-T axes 55 10 25  IMPRESSION AND PLAN:   58 y.o. male with a known history of alcohol abuse,GERD, hypertension, OCD, gout, asthma, and renal colic presenting to the ED for fall due to dizziness and ETOH intoxication.  1. Suspected sepsis: Pt p/w septic-type appearance, toxic/diaphoretic. SIRS (+),hypothermic (T 36.3). Hypotensive bp on presentation 73/56, Lactate 2.8 (improved to 2.1). CXR (-)  shows atelectasis,  U/A (-)  PCT, BCx, UCx pending. Received Vancomycin + Ceftriaxone in ED -Continue broad-spectrum antibiotics with Vanco and cefepime pending cultures -IV fluids  2. Fall like due to intoxication -Imaging including CT head and cervical spine negative for acute intracranial abnormality or fractures.  3. ETOH intoxication/Abuse- Known hx of alcohol abuse will watch for withdrawal -CIWA protocol -Continue Librium -Thiamine, Folate and multivitamin replacement -IVFs -Alcohol cessation counseling provided  4. Hypokalemia- Replace -Check labs in am  5.  DVT Prophylaxis- lOVENOX   All the records are reviewed and case discussed with ED provider. Management plans discussed with the patient, family and they are in agreement.  CODE STATUS: Full  TOTAL TIME TAKING CARE OF THIS PATIENT: 50 minutes.    on 03/08/2019 at 11:57 AM  This patient was staffed with Dr. Stark Jock, Jude who personally evaluated patient, reviewed documentation and agreed with assessment and plan of care as above.  Rufina Falco, DNP, FNP-BC [facility] Nurse Practitioner Between 7am to 6pm - Pager [phone]  After 6pm go to [email or web address] - password  [facility]  Office  [phone]  CC: Primary care physician; System, Pcp Not In

## 2019-03-08 NOTE — Consult Note (Signed)
Pharmacy Antibiotic Note  Miguel [name] is a 58 y.o. male admitted on 03/08/2019 with sepsis.  Pharmacy has been consulted for Vancomycin and Cefepime dosing. Patient received one dose of CTX and azithromycin in the ED.   Baseline Scr (04/15/202): 0.96 Current Scr (03/08/2019): 1.26  Plan: Start Cefepime 2g Q8H.  Start Vancomycin 2000 mg x 1 dose, then start maintenance dose of 1000 mg Q12H.  Expected AUC  534.8 mcg*h/mL  Goal AUC 400-550  mcg*h/mL  Css Min: 16.0 mcg/mL Will monitor Scr with AM labs.   Height: 6' (182.9 cm) Weight: 185 lb (83.9 kg) IBW/kg (Calculated) : 77.6  Temp (24hrs), Avg:97.2 F (36.2 C), Min:97.2 F (36.2 C), Max:97.2 F (36.2 C)  Recent Labs  Lab 03/01/19 1242 03/08/19 0110 03/08/19 0132 03/08/19 0335  WBC 6.7 6.3  --   --   CREATININE 0.96 1.26*  --   --   LATICACIDVEN  --   --  2.8* 2.6*    Estimated Creatinine Clearance: 69.3 mL/min (A) (by C-G formula based on SCr of 1.26 mg/dL (H)).    Allergies  Allergen Reactions  . Percocet [Oxycodone-Acetaminophen] Nausea And Vomiting    Antimicrobials this admission: 4/22 Azithromycin x 1 4/22 CTX x 1 4/22 Cefepime >> 4/22 Vancomycin >>   Dose adjustments this admission: N/A  Microbiology results: 4/22 BCx: pending  4/22 UCx: pending    Thank you for allowing pharmacy to be a part of this patient's care.  Rowland Lathe, PharmD  03/08/2019 8:14 AM

## 2019-03-08 NOTE — ED Notes (Signed)
2nd liter of NS started per Dr. Karma Greaser

## 2019-03-08 NOTE — ED Notes (Signed)
2nd liter of NS finished. 3rd liter started.

## 2019-03-08 NOTE — ED Provider Notes (Signed)
[facility] Emergency Department Provider Note  ____________________________________________   First MD Initiated Contact with Patient 03/08/19 0120     (approximate)  I have reviewed the triage vital signs and the nursing notes.   HISTORY  Chief Complaint Alcohol Intoxication and Fall  Level 5 caveat:  history/ROS limited by acute intoxication  HPI JAHMAL DUNAVANT is a 58 y.o. male with medical history as listed below who presents by EMS for acute intoxication and fall.  He is obviously heavily intoxicated but is able to wake up and knows where he is.  He states that he fell earlier tonight in the setting of drinking alcohol.  He says that "I tried to do good and stop drinking" but was unable to stay off of alcohol on Librium that he was recently prescribed.  He says that he took all of his blood pressure medicine today with which she is normally noncompliant.  He denies fever/chills, chest pain, shortness of breath, nausea, vomiting, abdominal pain.  His symptoms are severe and nothing in particular makes them better or worse.  He has some abrasions on his arms and said both of his arms hurt because he fell and then but he denies any other pain.  The patient is well-known to the emergency department with 13 visits in the last 6 months.      Past Medical History:  Diagnosis Date   Alcohol abuse    Asthma    GERD (gastroesophageal reflux disease)    Gout    Hypertension    Kidney stone    OCD (obsessive compulsive disorder)    Renal colic     Patient Active Problem List   Diagnosis Date Noted   Breast lump or mass 10/04/2018   Sepsis secondary to UTI ([place]) 09/14/2018   Asthma 07/07/2018   GERD (gastroesophageal reflux disease) 07/07/2018   OCD (obsessive compulsive disorder) 11/08/8249   Self-inflicted laceration of wrist 03/10/2018   Leg hematoma 12/25/2017   Suicide and self-inflicted injury by cutting and piercing instrument ([place])  03/10/2017   Severe recurrent major 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 ([employer]) 03/09/2017   Substance induced mood disorder ([place]) 08/15/2016   Involuntary commitment 08/15/2016   Alcohol use disorder, severe, dependence ([place]) 02/05/2016   Hypertension 12/05/2015   Tachycardia 12/05/2015   Gout 11/13/2015   Chronic back pain 05/02/2015    Past Surgical History:  Procedure Laterality Date   CHOLECYSTECTOMY  2012   EXTRACORPOREAL SHOCK WAVE LITHOTRIPSY Left 01/12/2019   Procedure: EXTRACORPOREAL SHOCK WAVE LITHOTRIPSY (ESWL);  Surgeon: Billey Co, MD;  Location: [facility] ORS;  Service: Urology;  Laterality: Left;    Prior to Admission medications   Medication Sig Start Date End Date Taking? Authorizing Provider  albuterol (VENTOLIN HFA) 108 (90 Base) MCG/ACT inhaler Inhale 2 puffs into the lungs every 4 (four) hours as needed. Patient taking differently: Inhale 2 puffs into the lungs every 4 (four) hours as needed for wheezing or shortness of breath.  09/01/18  Yes Tukov-Yual, Arlyss Gandy, NP  allopurinol (ZYLOPRIM) 300 MG tablet Take 1 tablet (300 mg total) by mouth daily. 09/01/18  Yes Tukov-Yual, Arlyss Gandy, NP  chlordiazePOXIDE (LIBRIUM) 25 MG capsule Take 1 tablet by mouth 5 times a day on day 1, then decrease by 1 tablet daily until gone. 03/01/19  Yes Earleen Newport, MD  docusate sodium (COLACE) 100 MG capsule Take 1 tablet once or twice daily as needed for constipation while taking narcotic pain medicine 01/09/19  Yes  Hinda Kehr, MD  Fluticasone-Salmeterol (ADVAIR DISKUS) 100-50 MCG/DOSE AEPB Inhale 1 puff into the lungs 2 (two) times daily. 09/01/18  Yes Tukov-Yual, Arlyss Gandy, NP  folic acid (FOLVITE) 1 MG tablet Take 1 tablet (1 mg total) by mouth daily. 09/01/18  Yes Tukov-Yual, Magdalene S, NP  lisinopril (PRINIVIL,ZESTRIL) 20 MG tablet Take 1 tablet (20 mg total) by mouth daily. 02/21/19  Yes Iloabachie, Chioma E, NP  Multiple Vitamins-Minerals (MULTIVITAMIN  WITH MINERALS) tablet Take 1 tablet by mouth daily. 09/01/18  Yes Tukov-Yual, Magdalene S, NP  tiZANidine (ZANAFLEX) 4 MG tablet Take 0.5 tablets (2 mg total) by mouth every 8 (eight) hours as needed for muscle spasms. 02/21/19  Yes Iloabachie, Chioma E, NP  ondansetron (ZOFRAN ODT) 4 MG disintegrating tablet Allow 1-2 tablets to dissolve in your mouth every 8 hours as needed for nausea/vomiting Patient not taking: Reported on 03/01/2019 01/09/19   Hinda Kehr, MD    Allergies Percocet [oxycodone-acetaminophen]  Family History  Problem Relation Age of Onset   Alcohol abuse Father    Breast cancer Mother 53    Social History Social History   Tobacco Use   Smoking status: Former Smoker   Smokeless tobacco: Never Used   Tobacco comment: quit 30 years ago  Substance Use Topics   Alcohol use: Yes    Alcohol/week: 7.0 standard drinks    Types: 7 Glasses of wine per week    Comment: wine earlier today   Drug use: No    Review of Systems Level 5 caveat:  history/ROS limited by acute intoxication.  See HPI for details.  ____________________________________________   PHYSICAL EXAM:  VITAL SIGNS: ED Triage Vitals  Enc Vitals Group     BP 03/08/19 0110 (!) 73/56     Pulse Rate 03/08/19 0110 72     Resp 03/08/19 0110 18     Temp 03/08/19 0110 (!) 97.2 F (36.2 C)     Temp Source 03/08/19 0110 Oral     SpO2 03/08/19 0110 98 %     Weight 03/08/19 0114 83.9 kg (185 lb)     Height 03/08/19 0114 1.829 m (6')     Head Circumference --      Peak Flow --      Pain Score 03/08/19 0111 0     Pain Loc --      Pain Edu? --      Excl. in [place]? --     Constitutional: Alert and oriented but obviously intoxicated.  Patient is at his baseline. Eyes: Conjunctivae are normal.  Head: Atraumatic. Nose: No congestion/rhinnorhea. Mouth/Throat: Mucous membranes are moist. Neck: No stridor.  No meningeal signs.   Cardiovascular: Normal rate, regular rhythm. Good peripheral circulation.  Grossly normal heart sounds. Respiratory: Normal respiratory effort.  No retractions. No audible wheezing. Gastrointestinal: Soft and nontender. No distention.  Musculoskeletal: No lower extremity tenderness nor edema. No gross deformities of extremities.  He has no pain with passive range of motion of shoulders, elbows, and wrists. Neurologic:  Normal speech and language. No gross focal neurologic deficits are appreciated.  Skin:  Skin is warm, dry and intact except for some superficial abrasions to both forearms. Psychiatric: Mood and affect are at his baseline.  Denies SI/HI.  ____________________________________________   LABS (all labs ordered are listed, but only abnormal results are displayed)  Labs Reviewed  ACETAMINOPHEN LEVEL - Abnormal; Notable for the following components:      Result Value   Acetaminophen (Tylenol), Serum <10 (*)  All other components within normal limits  COMPREHENSIVE METABOLIC PANEL - Abnormal; Notable for the following components:   Potassium 3.2 (*)    Glucose, Bld 129 (*)    Creatinine, Ser 1.26 (*)    Calcium 8.6 (*)    Total Protein 6.2 (*)    Albumin 3.4 (*)    AST 13 (*)    All other components within normal limits  ETHANOL - Abnormal; Notable for the following components:   Alcohol, Ethyl (B) 301 (*)    All other components within normal limits  LACTIC ACID, PLASMA - Abnormal; Notable for the following components:   Lactic Acid, Venous 2.8 (*)    All other components within normal limits  LACTIC ACID, PLASMA - Abnormal; Notable for the following components:   Lactic Acid, Venous 2.6 (*)    All other components within normal limits  CBC WITH DIFFERENTIAL/PLATELET - Abnormal; Notable for the following components:   RBC 4.14 (*)    Hemoglobin 11.4 (*)    HCT 36.2 (*)    RDW 20.7 (*)    Platelets 141 (*)    All other components within normal limits  CK - Abnormal; Notable for the following components:   Total CK 18 (*)    All other  components within normal limits  LIPASE, BLOOD  BRAIN NATRIURETIC PEPTIDE  TROPONIN I  SALICYLATE LEVEL  URINALYSIS, COMPLETE (UACMP) WITH MICROSCOPIC  URINE DRUG SCREEN, QUALITATIVE ([facility] ONLY)   ____________________________________________  EKG  ED ECG REPORT I, Hinda Kehr, the attending physician, personally viewed and interpreted this ECG.  Date: 03/08/2019 EKG Time: 1:11 AM Rate: 72 Rhythm: normal sinus rhythm QRS Axis: normal Intervals: normal ST/T Wave abnormalities: normal Narrative Interpretation: no evidence of acute ischemia  ____________________________________________  RADIOLOGY   ED MD interpretation:  Probable atelectasis, less likely infection. No acute abnormalities in abdomen/pelvis.  Official radiology report(s): Ct Chest W Contrast  Result Date: 03/08/2019 CLINICAL DATA:  58 year old male status post fall. EXAM: CT CHEST, ABDOMEN, AND PELVIS WITH CONTRAST TECHNIQUE: Multidetector CT imaging of the chest, abdomen and pelvis was performed following the standard protocol during bolus administration of intravenous contrast. CONTRAST:  154mL OMNIPAQUE IOHEXOL 300 MG/ML  SOLN COMPARISON:  Portable chest x-ray earlier today. Chest CT 08/18/2018. CT Abdomen and Pelvis 01/20/2019 and earlier. FINDINGS: CT CHEST FINDINGS Cardiovascular: Intact thoracic aorta. Mild Calcified aortic atherosclerosis. Cardiac size at the upper limits of normal to mildly increased. No pericardial effusion. Mediastinum/Nodes: Negative. No mediastinal hematoma or lymphadenopathy. Lungs/Pleura: Lower lung volumes with bilateral dependent pulmonary opacity. The opacity is fairly confluent in the lower lobes, but enhances and still most resembles atelectasis. Stable thickening along the confluence of the right major and minor fissures on series 4, image 72. No pneumothorax, pleural effusion, or pulmonary contusion. Musculoskeletal: No rib fracture identified. Intact sternum. Thoracic vertebrae  appear stable and intact. Visible shoulder structures appear intact. No acute osseous abnormality identified. No superficial soft tissue injury identified. CT ABDOMEN PELVIS FINDINGS Hepatobiliary: Evidence of hepatic steatosis. The gallbladder appears to be absent. Pancreas: Negative. Spleen: Negative. Adrenals/Urinary Tract: Normal adrenal glands. Bilateral nephrolithiasis, regressed on the left since October, and stable since March. Symmetric renal enhancement and contrast excretion. Stable ureters. Distended urinary bladder today. Stomach/Bowel: Stable rectosigmoid colon since October. Mild nonspecific wall thickening throughout. Proximal sigmoid diverticulosis. No definite active inflammation. More normal appearance of the descending and transverse colon. Mild fatty proliferation along the wall of the right colon. Negative appendix and terminal ileum. No dilated small  bowel. Negative stomach. Incidental duodenum diverticula are stable. No free air, free fluid. Vascular/Lymphatic: Mild Calcified aortic atherosclerosis. Major arterial structures are patent and intact. There is distal iliac and proximal femoral artery atherosclerosis. Portal venous system demonstrates early contrast timing but appears to be patent. No lymphadenopathy. Reproductive: Negative. Other: No pelvic free fluid. Musculoskeletal: Normal lumbar segmentation. Intact lumbar vertebrae. Sacrum, SI joints, pelvis, and proximal femurs appear intact. IMPRESSION: 1. No acute traumatic injury identified in the chest abdomen or pelvis. 2. Lower lung volumes with dependent pulmonary opacity that most resembles atelectasis. 3. Stable nephrolithiasis.  Distended urinary bladder today. 4. Hepatic steatosis. Sigmoid diverticulosis. Chronic rectosigmoid wall thickening which is stable since 2019 and might be postinflammatory. Electronically Signed   By: Genevie Ann M.D.   On: 03/08/2019 05:57   Ct Abdomen Pelvis W Contrast  Result Date: 03/08/2019 CLINICAL  DATA:  58 year old male status post fall. EXAM: CT CHEST, ABDOMEN, AND PELVIS WITH CONTRAST TECHNIQUE: Multidetector CT imaging of the chest, abdomen and pelvis was performed following the standard protocol during bolus administration of intravenous contrast. CONTRAST:  149mL OMNIPAQUE IOHEXOL 300 MG/ML  SOLN COMPARISON:  Portable chest x-ray earlier today. Chest CT 08/18/2018. CT Abdomen and Pelvis 01/20/2019 and earlier. FINDINGS: CT CHEST FINDINGS Cardiovascular: Intact thoracic aorta. Mild Calcified aortic atherosclerosis. Cardiac size at the upper limits of normal to mildly increased. No pericardial effusion. Mediastinum/Nodes: Negative. No mediastinal hematoma or lymphadenopathy. Lungs/Pleura: Lower lung volumes with bilateral dependent pulmonary opacity. The opacity is fairly confluent in the lower lobes, but enhances and still most resembles atelectasis. Stable thickening along the confluence of the right major and minor fissures on series 4, image 72. No pneumothorax, pleural effusion, or pulmonary contusion. Musculoskeletal: No rib fracture identified. Intact sternum. Thoracic vertebrae appear stable and intact. Visible shoulder structures appear intact. No acute osseous abnormality identified. No superficial soft tissue injury identified. CT ABDOMEN PELVIS FINDINGS Hepatobiliary: Evidence of hepatic steatosis. The gallbladder appears to be absent. Pancreas: Negative. Spleen: Negative. Adrenals/Urinary Tract: Normal adrenal glands. Bilateral nephrolithiasis, regressed on the left since October, and stable since March. Symmetric renal enhancement and contrast excretion. Stable ureters. Distended urinary bladder today. Stomach/Bowel: Stable rectosigmoid colon since October. Mild nonspecific wall thickening throughout. Proximal sigmoid diverticulosis. No definite active inflammation. More normal appearance of the descending and transverse colon. Mild fatty proliferation along the wall of the right colon.  Negative appendix and terminal ileum. No dilated small bowel. Negative stomach. Incidental duodenum diverticula are stable. No free air, free fluid. Vascular/Lymphatic: Mild Calcified aortic atherosclerosis. Major arterial structures are patent and intact. There is distal iliac and proximal femoral artery atherosclerosis. Portal venous system demonstrates early contrast timing but appears to be patent. No lymphadenopathy. Reproductive: Negative. Other: No pelvic free fluid. Musculoskeletal: Normal lumbar segmentation. Intact lumbar vertebrae. Sacrum, SI joints, pelvis, and proximal femurs appear intact. IMPRESSION: 1. No acute traumatic injury identified in the chest abdomen or pelvis. 2. Lower lung volumes with dependent pulmonary opacity that most resembles atelectasis. 3. Stable nephrolithiasis.  Distended urinary bladder today. 4. Hepatic steatosis. Sigmoid diverticulosis. Chronic rectosigmoid wall thickening which is stable since 2019 and might be postinflammatory. Electronically Signed   By: Genevie Ann M.D.   On: 03/08/2019 05:57   Dg Chest Portable 1 View  Result Date: 03/08/2019 CLINICAL DATA:  Cough EXAM: PORTABLE CHEST 1 VIEW COMPARISON:  11/20/2018 FINDINGS: Bibasilar atelectasis without focal consolidation. No pleural effusion or pneumothorax. Heart size is normal. IMPRESSION: Bibasilar atelectasis. Electronically Signed   By: Lennette Bihari  [place] M.D.   On: 03/08/2019 03:17    ____________________________________________   PROCEDURES   Procedure(s) performed (including Critical Care):  Procedures   ____________________________________________   INITIAL IMPRESSION / MDM / [place] / ED COURSE  As part of my medical decision making, I reviewed the following data within the [place] notes reviewed and incorporated, Labs reviewed , EKG interpreted , Old chart reviewed, Radiograph reviewed , Discussed with admitting physician (Dr. Marcille Blanco) and Notes from  prior ED visits      NEVYN BOSSMAN was evaluated in Emergency Department on 03/08/2019 for the symptoms described in the history of present illness. He was evaluated in the context of the global COVID-19 pandemic, which necessitated consideration that the patient might be at risk for infection with the SARS-CoV-2 virus that causes COVID-19. Institutional protocols and algorithms that pertain to the evaluation of patients at risk for COVID-19 are in a state of rapid change based on information released by regulatory bodies including the CDC and federal and state organizations. These policies and algorithms were followed during the patient's care in the ED.  Differential diagnosis includes, but is not limited to, alcohol intoxication, medication side effect, acute infection.  No signs or symptoms of COVID-19.  The patient is well-known to the emergency department and seems to be at his baseline.  His exam is benign including no tenderness to palpation of the abdomen.  He is obviously intoxicated but is alert and oriented to self and location and is in no distress.  He is hypotensive which is unusual for him but he rather probably told me that he took all of his medication today.  I suspect that his hypotension as a result of his intoxication, decreased other oral intake, and restarting all of his blood pressure medicine.  I will give him 2 L of normal saline and perform a broad laboratory evaluation but at this time there is no indication for pressors or aggressive advanced imaging.  We will continue to monitor on the cardiac monitor and pulse oximeter.  I am giving thiamine 100 mg IV given his known alcoholism and I will reassess after his work-up is back.  Clinical Course as of Mar 07 653  Wed Mar 08, 2019  0218 Patient resting, sleeping comfortably but awakens easily to light stimulation. Persistently hypotensive, continuing 2nd L NS IV bolus.   [CF]  6720 The patient is noted to have a lactate>2. With  the current information available to me, I Jarian't think the patient is in septic shock. The lactate>2, is related to starting up antihypertensives, excessive ETOH consumption, and decreased "other" PO intake.   Lactic Acid, Venous(!!): 2.8 [CF]  0347 Bibasilar atelectasis, no evidence of acute infection.  DG Chest Portable 1 View [CF]  [phone] Patient's repeat lactic was 2.6 even after 3L of NS. I woke him up and now he is complaining of abdominal pain. Given the persistent elevated lactic acid level and hypotension, I will obtain CTs of the chest, abd, and pelvis to eval for possible pneumonia and intraabdominal infection. At this point, I anticipate admission given persistent lab and vital sign abnormalities, though this is subject to change.   [CF]  0604 No acute abnormalities on CT scans unless the dependent enhancements on the chest (bilateral lower lobes) represent pneumonia rather than atelectasis.  Given his persistent hypotension and elevated lactic acid, I feel it is appropriate to give empiric antibiotics (ceftriaxone 1 g IV and azithromycin 500 mg IV).  I discussed the case by phone with Dr. Marcille Blanco with the hospitalist service and he will admit.   [CF]  [phone] Of note, in spite of the concerns, the patient does not technically meet sepsis criteria.   [CF]    Clinical Course User Index [CF] Hinda Kehr, MD     ____________________________________________  FINAL CLINICAL IMPRESSION(S) / ED DIAGNOSES  Final diagnoses:  Hypotension, unspecified hypotension type  Elevated lactic acid level  Alcoholic intoxication with complication (HCC)  Generalized abdominal pain     MEDICATIONS GIVEN DURING THIS VISIT:  Medications  azithromycin (ZITHROMAX) 500 mg in sodium chloride 0.9 % 250 mL IVPB (has no administration in time range)  sodium chloride 0.9 % bolus 1,000 mL (0 mLs Intravenous Stopped 03/08/19 0150)  thiamine (B-1) injection 100 mg (100 mg Intravenous Given 03/08/19 0144)    iohexol (OMNIPAQUE) 300 MG/ML solution 100 mL (100 mLs Intravenous Contrast Given 03/08/19 0453)  cefTRIAXone (ROCEPHIN) 1 g in sodium chloride 0.9 % 100 mL IVPB (1 g Intravenous New Bag/Given 03/08/19 1517)     ED Discharge Orders    None       Note:  This document was prepared using Dragon voice recognition software and may include unintentional dictation errors.   Hinda Kehr, MD 03/08/19 [phone]

## 2019-03-08 NOTE — ED Notes (Signed)
Pt is asleep and O2 will drop into the high 80's. Pt is placed on 2 liters of Salix. Dr. Karma Greaser made aware.

## 2019-03-08 NOTE — ED Notes (Signed)
Dr. Karma Greaser made aware of pts lactic acid.

## 2019-03-08 NOTE — ED Triage Notes (Signed)
Pt is ETOH +, pt fall in the kitchen due to be being dizzy and has abrasion noted on bilateral forearms. Pt stated that he " drink a few wine" today. Pt is hard to get answers from patient.

## 2019-03-08 NOTE — ED Notes (Addendum)
ED TO INPATIENT HANDOFF REPORT  ED Nurse Name and Phone #: Kirke Shaggy 24  S Name/Age/Gender Miguel [name] 58 y.o. male Room/Bed: ED02A/ED02A  Code Status   Code Status: Full Code  Home/SNF/Other Home Patient oriented to: self, time, place and situation Is this baseline? Yes   Triage Complete: Triage complete  Chief Complaint Fall  Triage Note Pt is ETOH +, pt fall in the kitchen due to be being dizzy and has abrasion noted on bilateral forearms. Pt stated that he " drink a few wine" today. Pt is hard to get answers from patient.   Allergies Allergies  Allergen Reactions  . Percocet [Oxycodone-Acetaminophen] Nausea And Vomiting    Level of Care/Admitting Diagnosis ED Disposition    ED Disposition Condition [facility]: [place] [100120]  Level of Care: Med-Surg [16]  Covid Evaluation: N/A  Diagnosis: Sepsis [facility]) [[id number]]  Admitting Physician: Otila Back [place]  Attending Physician: Otila Back [[id number]]  Estimated length of stay: past midnight tomorrow  Certification:: I certify this patient will need inpatient services for at least 2 midnights  PT Class (Do Not Modify): Inpatient [101]  PT Acc Code (Do Not Modify): Private [1]       B Medical/Surgery History Past Medical History:  Diagnosis Date  . Alcohol abuse   . Asthma   . GERD (gastroesophageal reflux disease)   . Gout   . Hypertension   . Kidney stone   . OCD (obsessive compulsive disorder)   . Renal colic    Past Surgical History:  Procedure Laterality Date  . CHOLECYSTECTOMY  2012  . EXTRACORPOREAL SHOCK WAVE LITHOTRIPSY Left 01/12/2019   Procedure: EXTRACORPOREAL SHOCK WAVE LITHOTRIPSY (ESWL);  Surgeon: Billey Co, MD;  Location: [facility] ORS;  Service: Urology;  Laterality: Left;     A IV Location/Drains/Wounds Patient Lines/Drains/Airways Status   Active Line/Drains/Airways    Name:   Placement date:   Placement time:   Site:   Days:    Peripheral IV 03/08/19 Left Antecubital   03/08/19    0116    Antecubital   less than 1   Peripheral IV 03/08/19 Right Forearm   03/08/19    0131    Forearm   less than 1   Wound / Incision (Open or Dehisced) Leg Right   -    -    Leg             Intake/Output Last 24 hours  Intake/Output Summary (Last 24 hours) at 03/08/2019 9622 Last data filed at 03/08/2019 2979 Gross per 24 hour  Intake -  Output 800 ml  Net -800 ml    Labs/Imaging Results for orders placed or performed during the hospital encounter of 03/08/19 (from the past 48 hour(s))  Acetaminophen level     Status: Abnormal   Collection Time: 03/08/19  1:10 AM  Result Value Ref Range   Acetaminophen (Tylenol), Serum <10 (L) 10 - 30 ug/mL    Comment: (NOTE) Therapeutic concentrations vary significantly. A range of 10-30 ug/mL  may be an effective concentration for many patients. However, some  are best treated at concentrations outside of this range. Acetaminophen concentrations >150 ug/mL at 4 hours after ingestion  and >50 ug/mL at 12 hours after ingestion are often associated with  toxic reactions. Performed at [facility], [address] [place], [place] [address]   Comprehensive metabolic panel     Status: Abnormal   Collection Time: 03/08/19  1:10 AM  Result Value Ref Range   Sodium 142 135 - 145 mmol/L   Potassium 3.2 (L) 3.5 - 5.1 mmol/L   Chloride 110 98 - 111 mmol/L   CO2 23 22 - 32 mmol/L   Glucose, Bld 129 (H) 70 - 99 mg/dL   BUN 12 6 - 20 mg/dL   Creatinine, Ser 1.26 (H) 0.61 - 1.24 mg/dL   Calcium 8.6 (L) 8.9 - 10.3 mg/dL   Total Protein 6.2 (L) 6.5 - 8.1 g/dL   Albumin 3.4 (L) 3.5 - 5.0 g/dL   AST 13 (L) 15 - 41 U/L   ALT 12 0 - 44 U/L   Alkaline Phosphatase 56 38 - 126 U/L   Total Bilirubin 0.4 0.3 - 1.2 mg/dL   GFR calc non Af Amer >60 >60 mL/min   GFR calc Af Amer >60 >60 mL/min   Anion gap 9 5 - 15    Comment: Performed at [facility], [address]  [place], [place] [address]  Ethanol     Status: Abnormal   Collection Time: 03/08/19  1:10 AM  Result Value Ref Range   Alcohol, Ethyl (B) 301 (HH) <10 mg/dL    Comment: CRITICAL RESULT CALLED TO, READ BACK BY AND VERIFIED WITH BRITTNEY SAMPSON AT 0143 03/08/2019 SMA (NOTE) Lowest detectable limit for serum alcohol is 10 mg/dL. For medical purposes only. Performed at [facility], [address] [place], [place] [address]   Lipase, blood     Status: None   Collection Time: 03/08/19  1:10 AM  Result Value Ref Range   Lipase 47 11 - 51 U/L    Comment: Performed at [facility], [address] [place], [place] [address]  Brain natriuretic peptide     Status: None   Collection Time: 03/08/19  1:10 AM  Result Value Ref Range   B Natriuretic Peptide 92.0 0.0 - 100.0 pg/mL    Comment: Performed at [facility], [address] [place], [place] [address]  Troponin I - Once     Status: None   Collection Time: 03/08/19  1:10 AM  Result Value Ref Range   Troponin I <0.03 <0.03 ng/mL    Comment: Performed at [facility], [address] [place], [place] [address]  Salicylate level     Status: None   Collection Time: 03/08/19  1:10 AM  Result Value Ref Range   Salicylate Lvl <6.2 2.8 - 30.0 mg/dL    Comment: Performed at [facility], [address] [place],  [address]  CBC with Differential     Status: Abnormal   Collection Time: 03/08/19  1:10 AM  Result Value Ref Range   WBC 6.3 4.0 - 10.5 K/uL   RBC 4.14 (L) 4.22 - 5.81 MIL/uL   Hemoglobin 11.4 (L) 13.0 - 17.0 g/dL   HCT 36.2 (L) 39.0 - 52.0 %   MCV 87.4 80.0 - 100.0 fL   MCH 27.5 26.0 - 34.0 pg   MCHC 31.5 30.0 - 36.0 g/dL   RDW 20.7 (H) 11.5 - 15.5 %   Platelets 141 (L) 150 - 400 K/uL   nRBC 0.0 0.0 - 0.2 %   Neutrophils Relative % 48 %   Neutro Abs 3.1 1.7 - 7.7 K/uL   Lymphocytes Relative 38 %   Lymphs Abs 2.4 0.7 - 4.0 K/uL   Monocytes Relative 11 %   Monocytes  Absolute 0.7 0.1 - 1.0 K/uL   Eosinophils Relative  2 %   Eosinophils Absolute 0.1 0.0 - 0.5 K/uL   Basophils Relative 1 %   Basophils Absolute 0.1 0.0 - 0.1 K/uL   Immature Granulocytes 0 %   Abs Immature Granulocytes 0.02 0.00 - 0.07 K/uL    Comment: Performed at [facility], [address] [place], [place] [address]  CK     Status: Abnormal   Collection Time: 03/08/19  1:10 AM  Result Value Ref Range   Total CK 18 (L) 49 - 397 U/L    Comment: Performed at [facility], [address] [place], [place] [address]  Lactic acid, plasma     Status: Abnormal   Collection Time: 03/08/19  1:32 AM  Result Value Ref Range   Lactic Acid, Venous 2.8 (HH) 0.5 - 1.9 mmol/L    Comment: CRITICAL RESULT CALLED TO, READ BACK BY AND VERIFIED WITH BRITTNEY SAMPSON AT 9702 03/08/2019 SMA Performed at [facility], [address] [place], [place] [address]   Lactic acid, plasma     Status: Abnormal   Collection Time: 03/08/19  3:35 AM  Result Value Ref Range   Lactic Acid, Venous 2.6 (HH) 0.5 - 1.9 mmol/L    Comment: CRITICAL RESULT CALLED TO, READ BACK BY AND VERIFIED WITH BRITTNEY SAMPSON AT 0430 03/08/2019 SMA Performed at [facility], [address] [place], [place] [address]   Urinalysis, Complete w Microscopic     Status: Abnormal   Collection Time: 03/08/19  7:38 AM  Result Value Ref Range   Color, Urine STRAW (A) YELLOW   APPearance CLEAR (A) CLEAR   Specific Gravity, Urine 1.002 (L) 1.005 - 1.030   pH 7.0 5.0 - 8.0   Glucose, UA NEGATIVE NEGATIVE mg/dL   Hgb urine dipstick NEGATIVE NEGATIVE   Bilirubin Urine NEGATIVE NEGATIVE   Ketones, ur NEGATIVE NEGATIVE mg/dL   Protein, ur NEGATIVE NEGATIVE mg/dL   Nitrite NEGATIVE NEGATIVE   Leukocytes,Ua NEGATIVE NEGATIVE   RBC / HPF 0-5 0 - 5 RBC/hpf   WBC, UA 0-5 0 - 5 WBC/hpf   Bacteria, UA NONE SEEN NONE SEEN   Squamous Epithelial / LPF NONE SEEN 0 - 5    Comment: Performed at [facility], [address] [place], [place] [address]   Ct Chest W Contrast  Result Date: 03/08/2019 CLINICAL DATA:  58 year old male status post fall. EXAM: CT CHEST, ABDOMEN, AND PELVIS WITH CONTRAST TECHNIQUE: Multidetector CT imaging of the chest, abdomen and pelvis was performed following the standard protocol during bolus administration of intravenous contrast. CONTRAST:  179mL OMNIPAQUE IOHEXOL 300 MG/ML  SOLN COMPARISON:  Portable chest x-ray earlier today. Chest CT 08/18/2018. CT Abdomen and Pelvis 01/20/2019 and earlier. FINDINGS: CT CHEST FINDINGS Cardiovascular: Intact thoracic aorta. Mild Calcified aortic atherosclerosis. Cardiac size at the upper limits of normal to mildly increased. No pericardial effusion. Mediastinum/Nodes: Negative. No mediastinal hematoma or lymphadenopathy. Lungs/Pleura: Lower lung volumes with bilateral dependent pulmonary opacity. The opacity is fairly confluent in the lower lobes, but enhances and still most resembles atelectasis. Stable thickening along the confluence of the right major and minor fissures on series 4, image 72. No pneumothorax, pleural effusion, or pulmonary contusion. Musculoskeletal: No rib fracture identified. Intact sternum. Thoracic vertebrae appear stable and intact. Visible shoulder structures appear intact. No acute osseous abnormality identified. No superficial soft tissue injury identified. CT ABDOMEN PELVIS FINDINGS Hepatobiliary: Evidence of hepatic steatosis. The gallbladder appears to be absent. Pancreas: Negative. Spleen: Negative. Adrenals/Urinary Tract: Normal adrenal  glands. Bilateral nephrolithiasis, regressed on the left since October, and stable since March. Symmetric renal enhancement and contrast excretion. Stable ureters. Distended urinary bladder today. Stomach/Bowel: Stable rectosigmoid colon since October. Mild nonspecific wall thickening throughout. Proximal sigmoid diverticulosis. No definite active inflammation. More  normal appearance of the descending and transverse colon. Mild fatty proliferation along the wall of the right colon. Negative appendix and terminal ileum. No dilated small bowel. Negative stomach. Incidental duodenum diverticula are stable. No free air, free fluid. Vascular/Lymphatic: Mild Calcified aortic atherosclerosis. Major arterial structures are patent and intact. There is distal iliac and proximal femoral artery atherosclerosis. Portal venous system demonstrates early contrast timing but appears to be patent. No lymphadenopathy. Reproductive: Negative. Other: No pelvic free fluid. Musculoskeletal: Normal lumbar segmentation. Intact lumbar vertebrae. Sacrum, SI joints, pelvis, and proximal femurs appear intact. IMPRESSION: 1. No acute traumatic injury identified in the chest abdomen or pelvis. 2. Lower lung volumes with dependent pulmonary opacity that most resembles atelectasis. 3. Stable nephrolithiasis.  Distended urinary bladder today. 4. Hepatic steatosis. Sigmoid diverticulosis. Chronic rectosigmoid wall thickening which is stable since 2019 and might be postinflammatory. Electronically Signed   By: Genevie Ann M.D.   On: 03/08/2019 05:57   Ct Abdomen Pelvis W Contrast  Result Date: 03/08/2019 CLINICAL DATA:  58 year old male status post fall. EXAM: CT CHEST, ABDOMEN, AND PELVIS WITH CONTRAST TECHNIQUE: Multidetector CT imaging of the chest, abdomen and pelvis was performed following the standard protocol during bolus administration of intravenous contrast. CONTRAST:  129mL OMNIPAQUE IOHEXOL 300 MG/ML  SOLN COMPARISON:  Portable chest x-ray earlier today. Chest CT 08/18/2018. CT Abdomen and Pelvis 01/20/2019 and earlier. FINDINGS: CT CHEST FINDINGS Cardiovascular: Intact thoracic aorta. Mild Calcified aortic atherosclerosis. Cardiac size at the upper limits of normal to mildly increased. No pericardial effusion. Mediastinum/Nodes: Negative. No mediastinal hematoma or lymphadenopathy. Lungs/Pleura:  Lower lung volumes with bilateral dependent pulmonary opacity. The opacity is fairly confluent in the lower lobes, but enhances and still most resembles atelectasis. Stable thickening along the confluence of the right major and minor fissures on series 4, image 72. No pneumothorax, pleural effusion, or pulmonary contusion. Musculoskeletal: No rib fracture identified. Intact sternum. Thoracic vertebrae appear stable and intact. Visible shoulder structures appear intact. No acute osseous abnormality identified. No superficial soft tissue injury identified. CT ABDOMEN PELVIS FINDINGS Hepatobiliary: Evidence of hepatic steatosis. The gallbladder appears to be absent. Pancreas: Negative. Spleen: Negative. Adrenals/Urinary Tract: Normal adrenal glands. Bilateral nephrolithiasis, regressed on the left since October, and stable since March. Symmetric renal enhancement and contrast excretion. Stable ureters. Distended urinary bladder today. Stomach/Bowel: Stable rectosigmoid colon since October. Mild nonspecific wall thickening throughout. Proximal sigmoid diverticulosis. No definite active inflammation. More normal appearance of the descending and transverse colon. Mild fatty proliferation along the wall of the right colon. Negative appendix and terminal ileum. No dilated small bowel. Negative stomach. Incidental duodenum diverticula are stable. No free air, free fluid. Vascular/Lymphatic: Mild Calcified aortic atherosclerosis. Major arterial structures are patent and intact. There is distal iliac and proximal femoral artery atherosclerosis. Portal venous system demonstrates early contrast timing but appears to be patent. No lymphadenopathy. Reproductive: Negative. Other: No pelvic free fluid. Musculoskeletal: Normal lumbar segmentation. Intact lumbar vertebrae. Sacrum, SI joints, pelvis, and proximal femurs appear intact. IMPRESSION: 1. No acute traumatic injury identified in the chest abdomen or pelvis. 2. Lower lung  volumes with dependent pulmonary opacity that most resembles atelectasis. 3. Stable nephrolithiasis.  Distended urinary bladder today. 4. Hepatic steatosis. Sigmoid diverticulosis. Chronic rectosigmoid wall  thickening which is stable since 2019 and might be postinflammatory. Electronically Signed   By: Genevie Ann M.D.   On: 03/08/2019 05:57   Dg Chest Portable 1 View  Result Date: 03/08/2019 CLINICAL DATA:  Cough EXAM: PORTABLE CHEST 1 VIEW COMPARISON:  11/20/2018 FINDINGS: Bibasilar atelectasis without focal consolidation. No pleural effusion or pneumothorax. Heart size is normal. IMPRESSION: Bibasilar atelectasis. Electronically Signed   By: Ulyses Jarred M.D.   On: 03/08/2019 03:17    Pending Labs Unresulted Labs (From admission, onward)    Start     Ordered   03/15/19 0500  Creatinine, serum  (enoxaparin (LOVENOX)    CrCl >/= 30 ml/min)  Weekly,   STAT    Comments:  while on enoxaparin therapy    03/08/19 0753   03/09/19 0500  Protime-INR  Tomorrow morning,   STAT     03/08/19 0753   03/09/19 0500  Cortisol-am, blood  Tomorrow morning,   STAT     03/08/19 0753   03/09/19 0500  Procalcitonin  Tomorrow morning,   STAT     03/08/19 0753   03/08/19 0801  Urine Culture  Once,   STAT     03/08/19 0800   03/08/19 0758  CULTURE, BLOOD (ROUTINE X 2) w Reflex to ID Panel  BLOOD CULTURE X 2,   STAT     03/08/19 0759   03/08/19 0756  Lactic acid, plasma  STAT Now then every 3 hours,   STAT     03/08/19 0759   03/08/19 0749  HIV antibody (Routine Testing)  Once,   STAT     03/08/19 0753   03/08/19 0120  Urine Drug Screen, Qualitative  Add-on,   AD     03/08/19 0120          Vitals/Pain Today's Vitals   03/08/19 0330 03/08/19 0337 03/08/19 0607 03/08/19 0710  BP: (!) 89/68 98/72 (!) 83/68 94/67  Pulse: 69 66 70 72  Resp: 12 13 13 18   Temp:      TempSrc:      SpO2: 100% 100% 100% 100%  Weight:      Height:      PainSc:        Isolation Precautions No active  isolations  Medications Medications  azithromycin (ZITHROMAX) 500 mg in sodium chloride 0.9 % 250 mL IVPB (500 mg Intravenous New Bag/Given 03/08/19 0716)  allopurinol (ZYLOPRIM) tablet 300 mg (has no administration in time range)  chlordiazePOXIDE (LIBRIUM) capsule 25 mg (has no administration in time range)  docusate sodium (COLACE) capsule 100 mg (has no administration in time range)  tiZANidine (ZANAFLEX) tablet 2 mg (has no administration in time range)  folic acid (FOLVITE) tablet 1 mg (has no administration in time range)  multivitamin with minerals tablet 1 tablet (has no administration in time range)  albuterol (VENTOLIN HFA) 108 (90 Base) MCG/ACT inhaler 2 puff (has no administration in time range)  mometasone-formoterol (DULERA) 100-5 MCG/ACT inhaler 2 puff (has no administration in time range)  enoxaparin (LOVENOX) injection 40 mg (has no administration in time range)  0.9 %  sodium chloride infusion (has no administration in time range)  thiamine (VITAMIN B-1) tablet 100 mg (has no administration in time range)  LORazepam (ATIVAN) tablet 1 mg (has no administration in time range)    Or  LORazepam (ATIVAN) injection 1 mg (has no administration in time range)  ondansetron (ZOFRAN) injection 4 mg (has no administration in time range)  sodium chloride 0.9 % bolus  1,000 mL (0 mLs Intravenous Stopped 03/08/19 0150)  thiamine (B-1) injection 100 mg (100 mg Intravenous Given 03/08/19 0144)  iohexol (OMNIPAQUE) 300 MG/ML solution 100 mL (100 mLs Intravenous Contrast Given 03/08/19 0453)  cefTRIAXone (ROCEPHIN) 1 g in sodium chloride 0.9 % 100 mL IVPB (0 g Intravenous Stopped 03/08/19 0748)    Mobility walks with person assist High fall risk   Focused Assessments ETOH   R Recommendations: See Admitting Provider Note  Report given to: Josh, RN  Additional Notes: 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

## 2019-03-09 ENCOUNTER — Other Ambulatory Visit: Payer: Self-pay

## 2019-03-09 ENCOUNTER — Encounter: Payer: Self-pay | Admitting: *Deleted

## 2019-03-09 ENCOUNTER — Emergency Department
Admission: EM | Admit: 2019-03-09 | Discharge: 2019-03-10 | Disposition: A | Discharge status: Home / Self Care | Payer: Self-pay | Attending: Emergency Medicine | Admitting: Emergency Medicine

## 2019-03-09 ENCOUNTER — Emergency Department: Payer: Self-pay

## 2019-03-09 ENCOUNTER — Inpatient Hospital Stay: Payer: Self-pay

## 2019-03-09 DIAGNOSIS — Z87891 Personal history of nicotine dependence: Secondary | ICD-10-CM | POA: Insufficient documentation

## 2019-03-09 DIAGNOSIS — I10 Essential (primary) hypertension: Secondary | ICD-10-CM | POA: Insufficient documentation

## 2019-03-09 DIAGNOSIS — Z79899 Other long term (current) drug therapy: Secondary | ICD-10-CM | POA: Insufficient documentation

## 2019-03-09 DIAGNOSIS — M2550 Pain in unspecified joint: Secondary | ICD-10-CM

## 2019-03-09 DIAGNOSIS — R509 Fever, unspecified: Secondary | ICD-10-CM

## 2019-03-09 LAB — CBC WITH DIFFERENTIAL/PLATELET
Abs Immature Granulocytes: 0.03 10*3/uL (ref 0.00–0.07)
Basophils Absolute: 0 10*3/uL (ref 0.0–0.1)
Basophils Relative: 1 %
Eosinophils Absolute: 0.1 10*3/uL (ref 0.0–0.5)
Eosinophils Relative: 3 %
HCT: 32.9 % — ABNORMAL LOW (ref 39.0–52.0)
Hemoglobin: 10.2 g/dL — ABNORMAL LOW (ref 13.0–17.0)
Immature Granulocytes: 1 %
Lymphocytes Relative: 33 %
Lymphs Abs: 1.6 10*3/uL (ref 0.7–4.0)
MCH: 27.3 pg (ref 26.0–34.0)
MCHC: 31 g/dL (ref 30.0–36.0)
MCV: 88 fL (ref 80.0–100.0)
Monocytes Absolute: 0.8 10*3/uL (ref 0.1–1.0)
Monocytes Relative: 15 %
Neutro Abs: 2.4 10*3/uL (ref 1.7–7.7)
Neutrophils Relative %: 47 %
Platelets: 126 10*3/uL — ABNORMAL LOW (ref 150–400)
RBC: 3.74 MIL/uL — ABNORMAL LOW (ref 4.22–5.81)
RDW: 20.5 % — ABNORMAL HIGH (ref 11.5–15.5)
WBC: 4.9 10*3/uL (ref 4.0–10.5)
nRBC: 0 % (ref 0.0–0.2)

## 2019-03-09 LAB — BASIC METABOLIC PANEL
Anion gap: 6 (ref 5–15)
BUN: 11 mg/dL (ref 6–20)
CO2: 22 mmol/L (ref 22–32)
Calcium: 7.4 mg/dL — ABNORMAL LOW (ref 8.9–10.3)
Chloride: 116 mmol/L — ABNORMAL HIGH (ref 98–111)
Creatinine, Ser: 0.92 mg/dL (ref 0.61–1.24)
GFR calc Af Amer: >60 mL/min (ref >60)
GFR calc non Af Amer: >60 mL/min (ref >60)
Glucose, Bld: 87 mg/dL (ref 70–99)
Potassium: 3.1 mmol/L — ABNORMAL LOW (ref 3.5–5.1)
Sodium: 144 mmol/L (ref 135–145)

## 2019-03-09 LAB — URIC ACID: Uric Acid, Serum: 5.7 mg/dL (ref 3.7–8.6)

## 2019-03-09 LAB — PROCALCITONIN: Procalcitonin: <0.1 ng/mL

## 2019-03-09 LAB — HIV ANTIBODY (ROUTINE TESTING W REFLEX): HIV Screen 4th Generation wRfx: NONREACTIVE

## 2019-03-09 LAB — URINE CULTURE: Culture: NO GROWTH

## 2019-03-09 LAB — CORTISOL-AM, BLOOD: Cortisol - AM: 6.4 ug/dL — ABNORMAL LOW (ref 6.7–22.6)

## 2019-03-09 LAB — MAGNESIUM: Magnesium: 1.3 mg/dL — ABNORMAL LOW (ref 1.7–2.4)

## 2019-03-09 LAB — PROTIME-INR
INR: 1.1 (ref 0.8–1.2)
Prothrombin Time: 14.2 seconds (ref 11.4–15.2)

## 2019-03-09 MED ORDER — KETOROLAC TROMETHAMINE 30 MG/ML IJ SOLN
15.0000 mg | Freq: Once | INTRAMUSCULAR | Status: AC
  Administered 2019-03-09: 15 mg via INTRAVENOUS

## 2019-03-09 MED ORDER — MAGNESIUM SULFATE 2 GM/50ML IV SOLN: 2.0000 g | Freq: Once | INTRAVENOUS | Status: DC

## 2019-03-09 MED ORDER — ACETAMINOPHEN 500 MG PO TABS
1000.0000 mg | ORAL_TABLET | Freq: Once | ORAL | Status: AC
  Administered 2019-03-09: 1000 mg via ORAL
  Filled 2019-03-09: qty 2

## 2019-03-09 MED ORDER — COLCHICINE 0.6 MG PO TABS
0.6000 mg | ORAL_TABLET | Freq: Two times a day (BID) | ORAL | Status: DC
  Administered 2019-03-09: 0.6 mg via ORAL
  Filled 2019-03-09: qty 1

## 2019-03-09 MED ORDER — COLCHICINE 0.6 MG PO TABS: 0.6000 mg | ORAL_TABLET | Freq: Every day | ORAL | 0 refills | Status: DC

## 2019-03-09 MED ORDER — MAGNESIUM 400 MG PO TABS: 400.0000 mg | ORAL_TABLET | ORAL | 0 refills | Status: AC

## 2019-03-09 MED ORDER — KETOROLAC TROMETHAMINE 30 MG/ML IJ SOLN
INTRAMUSCULAR | Status: AC
  Filled 2019-03-09: qty 1

## 2019-03-09 MED ORDER — TRAMADOL HCL 50 MG PO TABS
50.0000 mg | ORAL_TABLET | Freq: Once | ORAL | Status: AC
  Administered 2019-03-09: 50 mg via ORAL
  Filled 2019-03-09: qty 1

## 2019-03-09 MED ORDER — POTASSIUM CHLORIDE CRYS ER 20 MEQ PO TBCR
40.0000 meq | EXTENDED_RELEASE_TABLET | Freq: Once | ORAL | Status: AC
  Administered 2019-03-09: 40 meq via ORAL
  Filled 2019-03-09: qty 2

## 2019-03-09 MED ORDER — SODIUM CHLORIDE 0.9 % IV BOLUS
1000.0000 mL | Freq: Once | INTRAVENOUS | Status: AC
  Administered 2019-03-09: 1000 mL via INTRAVENOUS

## 2019-03-09 MED ORDER — LISINOPRIL 20 MG PO TABS: 20.0000 mg | ORAL_TABLET | Freq: Once | ORAL | Status: DC

## 2019-03-09 NOTE — ED Triage Notes (Signed)
Pt presents febrile, tachycardiac and hypertensive. Pt seen in the ED in past 24 hrs post fall. Pt states he developed joint pain prior to discharge from ED for fall and was administered colchicine and took usual home meds for gout as well as his own colchicine. Pt is now complaining of worse pain in bilateral knees and R elbow.

## 2019-03-09 NOTE — ED Provider Notes (Signed)
[facility] Emergency Department Provider Note   ____________________________________________   First MD Initiated Contact with Patient 03/09/19 2318     (approximate)  I have reviewed the triage vital signs and the nursing notes.   HISTORY  Chief Complaint Fever    HPI Miguel [name] is a 58 y.o. male who presents to the ED from home with a chief complaint of fever and joint pain.  Patient was discharged from the hospital in the afternoon status post admission for hypotension after fall.  States he was having pain in his right wrist and bilateral knees prior to discharge.  They checked a uric acid and started him on colchicine for gouty flareup.  States developed low-grade fever and increased pain since discharge.  Denies cough, chest pain, shortness of breath, abdominal pain, nausea, vomiting or diarrhea.  Denies recent trauma since last hospitalization, travel or exposure to persons diagnosed with coronavirus.        Past Medical History:  Diagnosis Date  . Alcohol abuse   . Asthma   . GERD (gastroesophageal reflux disease)   . Gout   . Hypertension   . Kidney stone   . OCD (obsessive compulsive disorder)   . Renal colic     Patient Active Problem List   Diagnosis Date Noted  . Sepsis ([place]) 03/08/2019  . Breast lump or mass 10/04/2018  . Sepsis secondary to UTI ([place]) 09/14/2018  . Asthma 07/07/2018  . GERD (gastroesophageal reflux disease) 07/07/2018  . OCD (obsessive compulsive disorder) 07/07/2018  . Self-inflicted laceration of wrist 03/10/2018  . Leg hematoma 12/25/2017  . Suicide and self-inflicted injury by cutting and piercing instrument ([place]) 03/10/2017  . Severe recurrent major 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 ([place]) 03/09/2017  . Substance induced mood disorder ([place]) 08/15/2016  . Involuntary commitment 08/15/2016  . Alcohol use disorder, severe, dependence ([place]) 02/05/2016  . Hypertension 12/05/2015  . Tachycardia 12/05/2015   . Gout 11/13/2015  . Chronic back pain 05/02/2015    Past Surgical History:  Procedure Laterality Date  . CHOLECYSTECTOMY  2012  . EXTRACORPOREAL SHOCK WAVE LITHOTRIPSY Left 01/12/2019   Procedure: EXTRACORPOREAL SHOCK WAVE LITHOTRIPSY (ESWL);  Surgeon: Billey Co, MD;  Location: [facility] ORS;  Service: Urology;  Laterality: Left;    Prior to Admission medications   Medication Sig Start Date End Date Taking? Authorizing Provider  albuterol (VENTOLIN HFA) 108 (90 Base) MCG/ACT inhaler Inhale 2 puffs into the lungs every 4 (four) hours as needed. Patient taking differently: Inhale 2 puffs into the lungs every 4 (four) hours as needed for wheezing or shortness of breath.  09/01/18   Tukov-Yual, Arlyss Gandy, NP  allopurinol (ZYLOPRIM) 300 MG tablet Take 1 tablet (300 mg total) by mouth daily. 09/01/18   Tukov-Yual, Arlyss Gandy, NP  chlordiazePOXIDE (LIBRIUM) 25 MG capsule Take 1 tablet by mouth 5 times a day on day 1, then decrease by 1 tablet daily until gone. 03/01/19   Earleen Newport, MD  colchicine 0.6 MG tablet Take 1 tablet (0.6 mg total) by mouth daily. 03/09/19   Lang Snow, NP  docusate sodium (COLACE) 100 MG capsule Take 1 tablet once or twice daily as needed for constipation while taking narcotic pain medicine 01/09/19   Hinda Kehr, MD  Fluticasone-Salmeterol (ADVAIR DISKUS) 100-50 MCG/DOSE AEPB Inhale 1 puff into the lungs 2 (two) times daily. 09/01/18   Tukov-Yual, Arlyss Gandy, NP  folic acid (FOLVITE) 1 MG tablet Take 1 tablet (1 mg total) by mouth  daily. 09/01/18   Tukov-Yual, Arlyss Gandy, NP  lisinopril (PRINIVIL,ZESTRIL) 20 MG tablet Take 1 tablet (20 mg total) by mouth daily. 02/21/19   Iloabachie, Chioma E, NP  Magnesium 400 MG TABS Take 400 mg by mouth 1 day or 1 dose for 1 dose. 03/09/19 03/10/19  Lang Snow, NP  Multiple Vitamins-Minerals (MULTIVITAMIN WITH MINERALS) tablet Take 1 tablet by mouth daily. 09/01/18   Tukov-Yual, Arlyss Gandy, NP   tiZANidine (ZANAFLEX) 4 MG tablet Take 0.5 tablets (2 mg total) by mouth every 8 (eight) hours as needed for muscle spasms. 02/21/19   Iloabachie, Chioma E, NP  traMADol (ULTRAM) 50 MG tablet Take 1 tablet (50 mg total) by mouth every 6 (six) hours as needed. 03/10/19   Paulette Blanch, MD    Allergies Percocet [oxycodone-acetaminophen]  Family History  Problem Relation Age of Onset  . Alcohol abuse Father   . Breast cancer Mother 80    Social History Social History   Tobacco Use  . Smoking status: Former Research scientist (life sciences)  . Smokeless tobacco: Never Used  . Tobacco comment: quit 30 years ago  Substance Use Topics  . Alcohol use: Yes    Alcohol/week: 7.0 standard drinks    Types: 7 Glasses of wine per week    Comment: wine earlier today  . Drug use: No    Review of Systems  Constitutional: No fever/chills Eyes: No visual changes. ENT: No sore throat. Cardiovascular: Denies chest pain. Respiratory: Denies shortness of breath. Gastrointestinal: No abdominal pain.  No nausea, no vomiting.  No diarrhea.  No constipation. Genitourinary: Negative for dysuria. Musculoskeletal: Positive for joint pain.  Negative for back pain. Skin: Negative for rash. Neurological: Negative for headaches, focal weakness or numbness.   ____________________________________________   PHYSICAL EXAM:  VITAL SIGNS: ED Triage Vitals  Enc Vitals Group     BP 03/09/19 2229 (!) 183/96     Pulse Rate 03/09/19 2229 (!) 116     Resp 03/09/19 2229 (!) 21     Temp 03/09/19 2229 (!) 100.7 F (38.2 C)     Temp Source 03/09/19 2229 Oral     SpO2 03/09/19 2226 99 %     Weight 03/09/19 2231 185 lb 3 oz (84 kg)     Height 03/09/19 2231 6' (1.829 m)     Head Circumference --      Peak Flow --      Pain Score 03/09/19 2230 8     Pain Loc --      Pain Edu? --      Excl. in [place]? --     Constitutional: Alert and oriented. Well appearing and in no acute distress. Eyes: Conjunctivae are normal. PERRL. EOMI. Head:  Atraumatic. Nose: No congestion/rhinnorhea. Mouth/Throat: Mucous membranes are moist.  Oropharynx non-erythematous. Neck: No stridor.   Cardiovascular: Tachycardic rate, regular rhythm. Grossly normal heart sounds.  Good peripheral circulation. Respiratory: Normal respiratory effort.  No retractions. Lungs CTAB. Gastrointestinal: Soft and nontender. No distention. No abdominal bruits. No CVA tenderness. Musculoskeletal: Right wrist and bilateral knees without notable swelling, erythema or warmth.  No lower extremity tenderness nor edema.  No joint effusions. Neurologic:  Normal speech and language. No gross focal neurologic deficits are appreciated.  Skin:  Skin is warm, dry and intact. No rash noted.  No petechiae. Psychiatric: Mood and affect are normal. Speech and behavior are normal.  ____________________________________________   LABS (all labs ordered are listed, but only abnormal results are displayed)  Labs Reviewed  COMPREHENSIVE METABOLIC PANEL - Abnormal; Notable for the following components:      Result Value   Potassium 3.3 (*)    CO2 20 (*)    Glucose, Bld 130 (*)    Calcium 8.5 (*)    Total Protein 6.3 (*)    AST 10 (*)    All other components within normal limits  CBC WITH DIFFERENTIAL/PLATELET - Abnormal; Notable for the following components:   RBC 4.08 (*)    Hemoglobin 11.2 (*)    HCT 35.1 (*)    RDW 19.6 (*)    Lymphs Abs 0.6 (*)    Monocytes Absolute 1.5 (*)    All other components within normal limits  URINALYSIS, COMPLETE (UACMP) WITH MICROSCOPIC - Abnormal; Notable for the following components:   Color, Urine STRAW (*)    APPearance CLEAR (*)    All other components within normal limits  CULTURE, BLOOD (ROUTINE X 2)  CULTURE, BLOOD (ROUTINE X 2)  URINE CULTURE  LACTIC ACID, PLASMA  TROPONIN I  ETHANOL   ____________________________________________  EKG  ED ECG REPORT I, Jahi Roza J, the attending physician, personally viewed and interpreted  this ECG.   Date: 03/10/2019  EKG Time: 2318  Rate: 111  Rhythm: sinus tachycardia  Axis: Normal  Intervals:none  ST&T Change: Nonspecific  ____________________________________________  RADIOLOGY  ED MD interpretation:    Official radiology report(s): Dg Wrist 2 Views Right  Result Date: 03/09/2019 CLINICAL DATA:  Right wrist pain after fall yesterday. EXAM: RIGHT WRIST - 2 VIEW COMPARISON:  Radiographs of March 07, 2017. FINDINGS: There is no evidence of fracture or dislocation. There is no evidence of arthropathy or other focal bone abnormality. Soft tissues are unremarkable. IMPRESSION: Negative. Electronically Signed   By: Marijo Conception M.D.   On: 03/09/2019 12:02   Dg Chest Port 1 View  Result Date: 03/09/2019 CLINICAL DATA:  Fever, tachycardia EXAM: PORTABLE CHEST 1 VIEW COMPARISON:  03/08/2019, 11/20/2018 FINDINGS: The heart size and mediastinal contours are within normal limits. No consolidation or effusion. Mild basilar atelectasis on the left. No pneumothorax. IMPRESSION: No active disease. Electronically Signed   By: Donavan Foil M.D.   On: 03/09/2019 23:33    ____________________________________________   PROCEDURES  Procedure(s) performed (including Critical Care):  Procedures   ____________________________________________   INITIAL IMPRESSION / ASSESSMENT AND PLAN / ED COURSE  As part of my medical decision making, I reviewed the following data within the [place] notes reviewed and incorporated, Labs reviewed and Notes from prior ED visits        58 year old male who returns to the ED after discharge yesterday afternoon for fever and arthralgia.  Will perform septic work-up and administer analgesia.  KAJ VASIL was evaluated in Emergency Department on 03/10/2019 for the symptoms described in the history of present illness. He was evaluated in the context of the global COVID-19 pandemic, which necessitated consideration that  the patient might be at risk for infection with the SARS-CoV-2 virus that causes COVID-19. Institutional protocols and algorithms that pertain to the evaluation of patients at risk for COVID-19 are in a state of rapid change based on information released by regulatory bodies including the CDC and federal and state organizations. These policies and algorithms were followed during the patient's care in the ED.  Clinical Course as of Mar 10 227  Fri Mar 10, 2019  0107 Updated patient on all test results.  Afebrile.  Updated him on normal WBC, unremarkable lactic  acid, normal urine and chest x-ray.  Will discharge home on analgesia and patient will follow-up closely with his PCP.  Strict return precautions given.  Patient verbalizes understanding and agrees with plan of care.   [JS]    Clinical Course User Index [JS] Paulette Blanch, MD     ____________________________________________   FINAL CLINICAL IMPRESSION(S) / ED DIAGNOSES  Final diagnoses:  Fever, unspecified fever cause  Arthralgia, unspecified joint     ED Discharge Orders         Ordered    traMADol (ULTRAM) 50 MG tablet  Every 6 hours PRN     03/10/19 0109           Note:  This document was prepared using Dragon voice recognition software and may include unintentional dictation errors.   Paulette Blanch, MD 03/10/19 [phone]

## 2019-03-09 NOTE — TOC Transition Note (Signed)
Transition of Care [facility]) - CM/SW Discharge Note   Patient Details  Name: Miguel [name] MRN: [id number] Date of Birth: [birth year]  [facility] CM/SW Contact:  Beverly Sessions, RN Phone Number: 03/09/2019, 4:04 PM   Clinical Narrative:    Patient to discharge home today.  Patient sates that he lives at home with his wife who has MS.  He is her primary caregiver.  Patient is self pay.  His wife draws $19 a month, and they receive food stamps.  Patient is followed by [facility] .  Has a follow up appointment scheduled for 4/30/  Patient states he follows up with his provider each Tuesday by tele visit.    Patient denies issues with transportation.  States "I have a car with a full tank of gas at home".  "I will have to take a cab home from here though".  Patient obtains medications from [facility]. Patient to discharge with rx for colchicine and Magnesium. Patient states "I already have a bottle of 200 colchicine at home, and [place]'t need any more of that"  Patient notified he would have to get his magnesium over the counter.  Patient confirms he will be able to get them at discharge.  Patient independent of all ADLs  Final next level of care: Home/Self Care Barriers to Discharge: Barriers Resolved   Patient Goals and CMS Choice Patient states their goals for this hospitalization and ongoing recovery are:: I need to get back  to check on my wife      Discharge Placement                       Discharge Plan and Services                                     Social Determinants of Health (SDOH) Interventions     Readmission Risk Interventions Readmission Risk Prevention Plan 03/09/2019  Transportation Screening Complete  Medication Review Press photographer) Complete  PCP or Specialist appointment within 3-5 days of discharge Complete  HRI or [place] Not Complete  HRI or Home Care Consult Pt Refusal Comments not indicated    SW Recovery Care/Counseling Consult Not Complete  SW Consult Not Complete Comments not indicated   Palliative Care Screening Not [place] Not Applicable  Some recent data might be hidden

## 2019-03-09 NOTE — Progress Notes (Signed)
[facility] NAME: Miguel [name]    MR#:  [id number]  DATE OF BIRTH:  [birth year]  SUBJECTIVE:   Chief Complaint  Patient presents with   Alcohol Intoxication   Fall   Patient complaining of right wrist pain rating 10/10 on a pain intensity rating scale 0-10. He report that pain started last night and has been progressive. He has difficulty with ROM. Patient state that he has hx of gout and that it may be just gout flare.  REVIEW OF SYSTEMS:  Review of Systems  Constitutional: Negative for chills, fever, malaise/fatigue and weight loss.  HENT: Negative for congestion, hearing loss and sore throat.   Eyes: Negative for blurred vision and double vision.  Respiratory: Negative for cough, shortness of breath and wheezing.   Cardiovascular: Negative for chest pain, palpitations, orthopnea and leg swelling.  Gastrointestinal: Negative for abdominal pain, diarrhea, nausea and vomiting.  Genitourinary: Negative for dysuria and urgency.  Musculoskeletal: Positive for joint pain. Negative for myalgias.       Right wrist pain  Skin: Negative for rash.  Neurological: Negative for dizziness, sensory change, speech change, focal weakness and headaches.  Psychiatric/Behavioral: Negative for depression.    DRUG ALLERGIES:   Allergies  Allergen Reactions   Percocet [Oxycodone-Acetaminophen] Nausea And Vomiting   VITALS:  Blood pressure (!) 175/98, pulse 95, temperature 97.8 F (36.6 C), temperature source Oral, resp. rate 20, height 6' (1.829 m), weight 83.9 kg, SpO2 96 %. PHYSICAL EXAMINATION:  Physical Exam  GENERAL:48* y.o.-year-old patient lying in the bed with no acute distress.  EYES: Pupils equal, round, reactive to light and accommodation. No scleral icterus. Extraocular muscles intact.  HEENT: Head atraumatic, normocephalic. Oropharynx and nasopharynx clear.  NECK: Supple, no jugular venous distention. No thyroid  enlargement, no tenderness.  LUNGS: Normal breath sounds bilaterally, no wheezing, rales,rhonchi or crepitation. No use of accessory muscles of respiration.  CARDIOVASCULAR: S1, S2 normal. No murmurs, rubs, or gallops.  ABDOMEN: Soft, nontender, nondistended. Bowel sounds present. No organomegaly or mass.  EXTREMITIES: No pedal edema, cyanosis, or clubbing.  NEUROLOGIC: Cranial nerves II through XII are intact. Muscle strength 5/5 in all extremities. Sensation intact. Gait not checked.  PSYCHIATRIC: The patient is alert and oriented x 3.  SKIN: No obvious rash, lesion, or ulcer.   DATA REVIEWED:  LABORATORY PANEL:  Male CBC Recent Labs  Lab 03/09/19 0532  WBC 4.9  HGB 10.2*  HCT 32.9*  PLT 126*   ------------------------------------------------------------------------------------------------------------------ Chemistries  Recent Labs  Lab 03/08/19 0110 03/09/19 0532  NA 142 144  K 3.2* 3.1*  CL 110 116*  CO2 23 22  GLUCOSE 129* 87  BUN 12 11  CREATININE 1.26* 0.92  CALCIUM 8.6* 7.4*  MG  --  1.3*  AST 13*  --   ALT 12  --   ALKPHOS 56  --   BILITOT 0.4  --    RADIOLOGY:  Dg Wrist 2 Views Right  Result Date: 03/09/2019 CLINICAL DATA:  Right wrist pain after fall yesterday. EXAM: RIGHT WRIST - 2 VIEW COMPARISON:  Radiographs of March 07, 2017. FINDINGS: There is no evidence of fracture or dislocation. There is no evidence of arthropathy or other focal bone abnormality. Soft tissues are unremarkable. IMPRESSION: Negative. Electronically Signed   By: Marijo Conception M.D.   On: 03/09/2019 12:02   Ct Head Wo Contrast  Result Date: 03/08/2019 CLINICAL DATA:  Ataxia, head injury. EXAM: CT HEAD WITHOUT  CONTRAST CT CERVICAL SPINE WITHOUT CONTRAST TECHNIQUE: Multidetector CT imaging of the head and cervical spine was performed following the standard protocol without intravenous contrast. Multiplanar CT image reconstructions of the cervical spine were also generated. COMPARISON:   CT scan of February 12, 2018. FINDINGS: CT HEAD FINDINGS Brain: Mild diffuse cortical atrophy is noted. Minimal chronic ischemic white matter disease is noted. No mass effect or midline shift is noted. Ventricular size is within normal limits. There is no evidence of mass lesion, hemorrhage or acute infarction. Vascular: No hyperdense vessel or unexpected calcification. Skull: Normal. Negative for fracture or focal lesion. Sinuses/Orbits: No acute finding. Other: None. CT CERVICAL SPINE FINDINGS Alignment: Normal. Skull base and vertebrae: No acute fracture. No primary bone lesion or focal pathologic process. Soft tissues and spinal canal: No prevertebral fluid or swelling. No visible canal hematoma. Disc levels: Mild degenerative disc disease is noted at C5-6 and C6-7 with anterior osteophyte formation. Upper chest: Negative. Other: None. IMPRESSION: Mild diffuse cortical atrophy. Minimal chronic ischemic white matter disease. No acute intracranial abnormality seen. Mild multilevel degenerative disc disease. No acute abnormality seen in the cervical spine. Electronically Signed   By: Marijo Conception M.D.   On: 03/08/2019 08:28   Ct Chest W Contrast  Result Date: 03/08/2019 CLINICAL DATA:  58 year old male status post fall. EXAM: CT CHEST, ABDOMEN, AND PELVIS WITH CONTRAST TECHNIQUE: Multidetector CT imaging of the chest, abdomen and pelvis was performed following the standard protocol during bolus administration of intravenous contrast. CONTRAST:  166mL OMNIPAQUE IOHEXOL 300 MG/ML  SOLN COMPARISON:  Portable chest x-ray earlier today. Chest CT 08/18/2018. CT Abdomen and Pelvis 01/20/2019 and earlier. FINDINGS: CT CHEST FINDINGS Cardiovascular: Intact thoracic aorta. Mild Calcified aortic atherosclerosis. Cardiac size at the upper limits of normal to mildly increased. No pericardial effusion. Mediastinum/Nodes: Negative. No mediastinal hematoma or lymphadenopathy. Lungs/Pleura: Lower lung volumes with bilateral  dependent pulmonary opacity. The opacity is fairly confluent in the lower lobes, but enhances and still most resembles atelectasis. Stable thickening along the confluence of the right major and minor fissures on series 4, image 72. No pneumothorax, pleural effusion, or pulmonary contusion. Musculoskeletal: No rib fracture identified. Intact sternum. Thoracic vertebrae appear stable and intact. Visible shoulder structures appear intact. No acute osseous abnormality identified. No superficial soft tissue injury identified. CT ABDOMEN PELVIS FINDINGS Hepatobiliary: Evidence of hepatic steatosis. The gallbladder appears to be absent. Pancreas: Negative. Spleen: Negative. Adrenals/Urinary Tract: Normal adrenal glands. Bilateral nephrolithiasis, regressed on the left since October, and stable since March. Symmetric renal enhancement and contrast excretion. Stable ureters. Distended urinary bladder today. Stomach/Bowel: Stable rectosigmoid colon since October. Mild nonspecific wall thickening throughout. Proximal sigmoid diverticulosis. No definite active inflammation. More normal appearance of the descending and transverse colon. Mild fatty proliferation along the wall of the right colon. Negative appendix and terminal ileum. No dilated small bowel. Negative stomach. Incidental duodenum diverticula are stable. No free air, free fluid. Vascular/Lymphatic: Mild Calcified aortic atherosclerosis. Major arterial structures are patent and intact. There is distal iliac and proximal femoral artery atherosclerosis. Portal venous system demonstrates early contrast timing but appears to be patent. No lymphadenopathy. Reproductive: Negative. Other: No pelvic free fluid. Musculoskeletal: Normal lumbar segmentation. Intact lumbar vertebrae. Sacrum, SI joints, pelvis, and proximal femurs appear intact. IMPRESSION: 1. No acute traumatic injury identified in the chest abdomen or pelvis. 2. Lower lung volumes with dependent pulmonary  opacity that most resembles atelectasis. 3. Stable nephrolithiasis.  Distended urinary bladder today. 4. Hepatic steatosis. Sigmoid diverticulosis. Chronic  rectosigmoid wall thickening which is stable since 2019 and might be postinflammatory. Electronically Signed   By: Genevie Ann M.D.   On: 03/08/2019 05:57   Ct Cervical Spine Wo Contrast  Result Date: 03/08/2019 CLINICAL DATA:  Ataxia, head injury. EXAM: CT HEAD WITHOUT CONTRAST CT CERVICAL SPINE WITHOUT CONTRAST TECHNIQUE: Multidetector CT imaging of the head and cervical spine was performed following the standard protocol without intravenous contrast. Multiplanar CT image reconstructions of the cervical spine were also generated. COMPARISON:  CT scan of February 12, 2018. FINDINGS: CT HEAD FINDINGS Brain: Mild diffuse cortical atrophy is noted. Minimal chronic ischemic white matter disease is noted. No mass effect or midline shift is noted. Ventricular size is within normal limits. There is no evidence of mass lesion, hemorrhage or acute infarction. Vascular: No hyperdense vessel or unexpected calcification. Skull: Normal. Negative for fracture or focal lesion. Sinuses/Orbits: No acute finding. Other: None. CT CERVICAL SPINE FINDINGS Alignment: Normal. Skull base and vertebrae: No acute fracture. No primary bone lesion or focal pathologic process. Soft tissues and spinal canal: No prevertebral fluid or swelling. No visible canal hematoma. Disc levels: Mild degenerative disc disease is noted at C5-6 and C6-7 with anterior osteophyte formation. Upper chest: Negative. Other: None. IMPRESSION: Mild diffuse cortical atrophy. Minimal chronic ischemic white matter disease. No acute intracranial abnormality seen. Mild multilevel degenerative disc disease. No acute abnormality seen in the cervical spine. Electronically Signed   By: Marijo Conception M.D.   On: 03/08/2019 08:28   Ct Abdomen Pelvis W Contrast  Result Date: 03/08/2019 CLINICAL DATA:  58 year old male status  post fall. EXAM: CT CHEST, ABDOMEN, AND PELVIS WITH CONTRAST TECHNIQUE: Multidetector CT imaging of the chest, abdomen and pelvis was performed following the standard protocol during bolus administration of intravenous contrast. CONTRAST:  165mL OMNIPAQUE IOHEXOL 300 MG/ML  SOLN COMPARISON:  Portable chest x-ray earlier today. Chest CT 08/18/2018. CT Abdomen and Pelvis 01/20/2019 and earlier. FINDINGS: CT CHEST FINDINGS Cardiovascular: Intact thoracic aorta. Mild Calcified aortic atherosclerosis. Cardiac size at the upper limits of normal to mildly increased. No pericardial effusion. Mediastinum/Nodes: Negative. No mediastinal hematoma or lymphadenopathy. Lungs/Pleura: Lower lung volumes with bilateral dependent pulmonary opacity. The opacity is fairly confluent in the lower lobes, but enhances and still most resembles atelectasis. Stable thickening along the confluence of the right major and minor fissures on series 4, image 72. No pneumothorax, pleural effusion, or pulmonary contusion. Musculoskeletal: No rib fracture identified. Intact sternum. Thoracic vertebrae appear stable and intact. Visible shoulder structures appear intact. No acute osseous abnormality identified. No superficial soft tissue injury identified. CT ABDOMEN PELVIS FINDINGS Hepatobiliary: Evidence of hepatic steatosis. The gallbladder appears to be absent. Pancreas: Negative. Spleen: Negative. Adrenals/Urinary Tract: Normal adrenal glands. Bilateral nephrolithiasis, regressed on the left since October, and stable since March. Symmetric renal enhancement and contrast excretion. Stable ureters. Distended urinary bladder today. Stomach/Bowel: Stable rectosigmoid colon since October. Mild nonspecific wall thickening throughout. Proximal sigmoid diverticulosis. No definite active inflammation. More normal appearance of the descending and transverse colon. Mild fatty proliferation along the wall of the right colon. Negative appendix and terminal  ileum. No dilated small bowel. Negative stomach. Incidental duodenum diverticula are stable. No free air, free fluid. Vascular/Lymphatic: Mild Calcified aortic atherosclerosis. Major arterial structures are patent and intact. There is distal iliac and proximal femoral artery atherosclerosis. Portal venous system demonstrates early contrast timing but appears to be patent. No lymphadenopathy. Reproductive: Negative. Other: No pelvic free fluid. Musculoskeletal: Normal lumbar segmentation. Intact lumbar vertebrae. Sacrum,  SI joints, pelvis, and proximal femurs appear intact. IMPRESSION: 1. No acute traumatic injury identified in the chest abdomen or pelvis. 2. Lower lung volumes with dependent pulmonary opacity that most resembles atelectasis. 3. Stable nephrolithiasis.  Distended urinary bladder today. 4. Hepatic steatosis. Sigmoid diverticulosis. Chronic rectosigmoid wall thickening which is stable since 2019 and might be postinflammatory. Electronically Signed   By: Genevie Ann M.D.   On: 03/08/2019 05:57   Dg Chest Portable 1 View  Result Date: 03/08/2019 CLINICAL DATA:  Cough EXAM: PORTABLE CHEST 1 VIEW COMPARISON:  11/20/2018 FINDINGS: Bibasilar atelectasis without focal consolidation. No pleural effusion or pneumothorax. Heart size is normal. IMPRESSION: Bibasilar atelectasis. Electronically Signed   By: Ulyses Jarred M.D.   On: 03/08/2019 03:17   ASSESSMENT AND PLAN:   58 y.o. male  with a known history of alcohol abuse,GERD, hypertension, OCD, gout, asthma, and renal colic presenting to the ED for fall due to dizziness and ETOH intoxication.  1. Suspected sepsis: Pt p/w septic-type appearance, toxic/diaphoretic. SIRS (+),hypothermic (T 36.3).Hypotensive bp on presentation 73/56, Lactate 2.8 (improved to 2.1).CXR (-)  shows atelectasis.Received Vancomycin + Ceftriaxone in ED -Continue broad-spectrum antibiotics with cefepime pending cultures. Will stop vancomycin given normal WBC, procalcitonin and  blood cultures showing no growth so far. -UC pending -Continue IV fluids  2. Fall like due to intoxication -Imaging including CT head and cervical spine negative for acute intracranial abnormality or fractures.  3. ETOH intoxication/Abuse- Known hx of alcohol abuse will watch for withdrawal -CIWA protocol -Continue Librium -Thiamine, Folate and multivitamin replacement -IVFs -Alcohol cessation counseling provided  4. Hypokalemia- on supplements -Check labs in am  5. Hypomagnesemia - replace with IV mag -Recheck in am  6.  DVT Prophylaxis- Lovenox  7. Right wrist pain in a patient with hx of gout on Allopurinol and recent fall -Xray of wrist shows no fx -Uric acid pending -Start Colchicine -Will give one time dose of Tramadol   All the records are reviewed and case discussed with Care Management/Social Worker. Management plans discussed with the patient, family and they are in agreement.  CODE STATUS: Full Code  TOTAL TIME TAKING CARE OF THIS PATIENT: 39 minutes.   More than 50% of the time was spent in counseling/coordination of care: YES  POSSIBLE D/C IN 1 DAYS, DEPENDING ON CLINICAL CONDITION.   on 03/09/2019 at 2:02 PM  This patient was staffed with Dr. Otila Back who personally evaluated patient, reviewed documentation and agreed with assessment and plan of care as above.  Rufina Falco, DNP, FNP-BC [facility] Nurse Practitioner   Between 7am to 6pm - Pager [phone]  After 6pm go to [email or web address] - Proofreader  [facility] [place]  Office  [phone]  CC: Primary care physician; System, Pcp Not In  Note: This dictation was prepared with Dragon dictation along with smaller phrase technology. Any transcriptional errors that result from this process are unintentional.

## 2019-03-09 NOTE — BH Specialist Note (Signed)
Integrated Behavioral Health Follow Up Phone Visit  MRN: [id number] Name: Miguel [name]  Number of [place] Clinician visits: 1/6  Type of Service: [place] Interpretor:No. Interpretor Name and Language: Not applicable.  SUBJECTIVE: Miguel [name] is a 58 y.o. male accompanied by himself. Patient was referred by Miguel Shadow Np at [facility] for substance abuse. Patient reports the following symptoms/concerns: He reports that he ended up at the emergency room twice in the last week due to withdrawal from alcohol. He explains that he was placed on Librium both days and is feeling a lot better. He denies an urge to drink ever again because he doesn't like the way that it makes him feel. He explains that he has tried [facility] in the past for several weeks but felt he couldn't relate to any of the group members due to him being the only alcoholic so he stopped going. 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 He explains that his plan is to think about consequences, money, and arguments with his wife to keep him from drinking again. He reports that his only support is his wife. He denies suicidal and homicidal thoughts.  Duration of problem: ; Severity of problem: mild  OBJECTIVE: Mood: Euthymic and Affect: Appropriate Risk of harm to self or others: No plan to harm self or others  LIFE CONTEXT: Family and Social: see above. School/Work: Miguel [name] relys on wife's disability income. Self-Care: see above. Life Changes: see above.   GOALS ADDRESSED: Patient will: 1.  Reduce symptoms of: substance abuse and relapse prevention  2.  Increase knowledge and/or ability of: relapse prevention  3.  Demonstrate ability to: Increase healthy adjustment to current life circumstances  INTERVENTIONS: Interventions utilized:  Psychoeducation and/or Health  Education and Link to [employer] discussed with the patient regarding his recent visits to the emergency room and why he was referred to her. Clinician assessed the severity of the patient's substance abuse and asked if he has tried programs in the past. Clinician asked the patient if he has ever tried [facility]. Clinician explained that [facility] is offering online 12 step self help groups so he wouldn't have to leave his home and his wife for long periods of time. Clinician explained to the patient that she realizes that he has no desire to drink again but asked him what his plan was for one he has a craving or something stressful occurs. Clinician explained that its important to have a plan in place to avoid relapse and ending back in the emergency room. Clinician asked the patient if he would at least agree to follow up with her in two weeks to see how he is doing and revisit the option of potential program.  Standardized Assessments completed: GAD-7 and PHQ 9  ASSESSMENT: Patient recently ended up in the emergency room twice on April 15th and April 16th of 2020 due to alcohol withdrawal. He explains that he switched from drinking vodka daily to a quart of wine daily because the alcohol percentage is less in wine compared to liquor. He explains that he got scared because he couldn't sleep, couldn't relax, felt anxious, and depressed. He describes feeling like he was going to die so he went to the emergency room where he was treated with benzodiazepines to assist with his symptoms of withdrawal.  Patient may benefit from a relapse prevention plan.   PLAN: 1. Follow up with behavioral health clinician on : two weeks or earlier if needed. 2. Behavioral recommendations: see above. 3. Referral(s): [place] (In Clinic) 4. "From scale of 1-10, how likely are you to follow plan?": Miguel Marion, LCSW

## 2019-03-09 NOTE — Discharge Summary (Signed)
[facility] [place] NAME: Miguel [name]    MR#:  [id number]  DATE OF BIRTH:  07-Jul-1961  DATE OF ADMISSION:  03/08/2019   ADMITTING PHYSICIAN: Otila Back, MD  DATE OF DISCHARGE: No discharge date for patient encounter.  PRIMARY CARE PHYSICIAN: System, Pcp Not In   ADMISSION DIAGNOSIS:   Generalized abdominal pain [R10.84] Elevated lactic acid level [W62.03] Alcoholic intoxication with complication (HCC) [T59.741] Hypotension, unspecified hypotension type [I95.9]  DISCHARGE DIAGNOSIS:   Active Problems:   Sepsis ([place])   SECONDARY DIAGNOSIS:   Past Medical History:  Diagnosis Date  . Alcohol abuse   . Asthma   . GERD (gastroesophageal reflux disease)   . Gout   . Hypertension   . Kidney stone   . OCD (obsessive compulsive disorder)   . Renal colic     HOSPITAL COURSE:  58 y.o. male  with a known history ofalcohol abuse,GERD, hypertension,OCD, gout, asthma, and renal colic presenting to the ED forfall due to dizziness and ETOH intoxication.  1. Suspected sepsis: Pt p/w septic-type appearance, toxic/diaphoretic. SIRS (+),hypothermic(T 36.3).Hypotensive bp on presentation 73/56,Lactate 2.8(improved to 2.1).CXR (-)shows atelectasis.Received Vancomycin + Ceftriaxone in ED.  Presenting symptoms may have likely due to alcohol intoxication as he has not shown any further signs of sepsis. -Blood and urine cultures shows no growth so far -WBC and procalcitonin within normal range -Will discontinue abx at this time . He would wish to be discharge home today  2. Fall - likely due to intoxication -Imaging including CT head and cervical spine negative for acute intracranial abnormality or fractures.  3. ETOH intoxication/Abuse- Known hx of alcohol abuse will watch for withdrawal -Continue Librium at home dise -Alcohol cessation counseling provided, resources also provided at discharge. Will set up appt with Alcohol and drug  abuse treatment center in [place]. -Follow up with PCP   4. Hypokalemia- on supplements  5. Hypomagnesemia - Low at recheck he however would like to be discharged today prior to replacement. Will give po dose at discharge.  6. Right wrist pain in a patient with hx of gout on Allopurinol and recent fall -Xray of wrist shows no fx -Uric acid pending -Start Colchicine will continue daily x 4 days then stop -Given Tramadol one time dose.  DISCHARGE CONDITIONS:   stable CONSULTS OBTAINED:     DRUG ALLERGIES:   Allergies  Allergen Reactions  . Percocet [Oxycodone-Acetaminophen] Nausea And Vomiting   DISCHARGE MEDICATIONS:   Allergies as of 03/09/2019      Reactions   Percocet [oxycodone-acetaminophen] Nausea And Vomiting      Medication List    STOP taking these medications   ondansetron 4 MG disintegrating tablet Commonly known as:  Zofran ODT     TAKE these medications   albuterol 108 (90 Base) MCG/ACT inhaler Commonly known as:  Ventolin HFA Inhale 2 puffs into the lungs every 4 (four) hours as needed. What changed:  reasons to take this   allopurinol 300 MG tablet Commonly known as:  ZYLOPRIM Take 1 tablet (300 mg total) by mouth daily.   chlordiazePOXIDE 25 MG capsule Commonly known as:  LIBRIUM Take 1 tablet by mouth 5 times a day on day 1, then decrease by 1 tablet daily until gone.   colchicine 0.6 MG tablet Take 1 tablet (0.6 mg total) by mouth daily.   docusate sodium 100 MG capsule Commonly known as:  Colace Take 1 tablet once or twice daily as needed for  constipation while taking narcotic pain medicine   Fluticasone-Salmeterol 100-50 MCG/DOSE Aepb Commonly known as:  Advair Diskus Inhale 1 puff into the lungs 2 (two) times daily.   folic acid 1 MG tablet Commonly known as:  FOLVITE Take 1 tablet (1 mg total) by mouth daily.   lisinopril 20 MG tablet Commonly known as:  ZESTRIL Take 1 tablet (20 mg total) by mouth daily.    multivitamin with minerals tablet Take 1 tablet by mouth daily.   tiZANidine 4 MG tablet Commonly known as:  ZANAFLEX Take 0.5 tablets (2 mg total) by mouth every 8 (eight) hours as needed for muscle spasms.        DISCHARGE INSTRUCTIONS:    DIET:   Regular diet  ACTIVITY:   Activity as tolerated  OXYGEN:   Home Oxygen: No.  Oxygen Delivery: room air  DISCHARGE LOCATION:   home   If you experience worsening of your admission symptoms, develop shortness of breath, life threatening emergency, suicidal or homicidal thoughts you must seek medical attention immediately by calling 911 or calling your MD immediately  if symptoms less severe.  You Must read complete instructions/literature along with all the possible adverse reactions/side effects for all the Medicines you take and that have been prescribed to you. Take any new Medicines after you have completely understood and accpet all the possible adverse reactions/side effects.   Please note  You were cared for by a hospitalist during your hospital stay. If you have any questions about your discharge medications or the care you received while you were in the hospital after you are discharged, you can call the unit and asked to speak with the hospitalist on call if the hospitalist that took care of you is not available. Once you are discharged, your primary care physician will handle any further medical issues. Please note that NO REFILLS for any discharge medications will be authorized once you are discharged, as it is imperative that you return to your primary care physician (or establish a relationship with a primary care physician if you do not have one) for your aftercare needs so that they can reassess your need for medications and monitor your lab values.    On the day of Discharge:  VITAL SIGNS:   Blood pressure (!) 175/98, pulse 95, temperature 97.8 F (36.6 C), temperature source Oral, resp. rate 20, height 6'  (1.829 m), weight 83.9 kg, SpO2 96 %.  PHYSICAL EXAMINATION:    GENERAL:  58 y.o.-year-old patient lying in the bed with no acute distress.  EYES: Pupils equal, round, reactive to light and accommodation. No scleral icterus. Extraocular muscles intact.  HEENT: Head atraumatic, normocephalic. Oropharynx and nasopharynx clear.  NECK:  Supple, no jugular venous distention. No thyroid enlargement, no tenderness.  LUNGS: Normal breath sounds bilaterally, no wheezing, rales,rhonchi or crepitation. No use of accessory muscles of respiration.  CARDIOVASCULAR: S1, S2 normal. No murmurs, rubs, or gallops.  ABDOMEN: Soft, non-tender, non-distended. Bowel sounds present. No organomegaly or mass.  EXTREMITIES: No pedal edema, cyanosis, or clubbing.  NEUROLOGIC: Cranial nerves II through XII are intact. Muscle strength 5/5 in all extremities. Sensation intact. Gait not checked.  PSYCHIATRIC: The patient is alert and oriented x 3.  SKIN: No obvious rash, lesion, or ulcer.   DATA REVIEW:   CBC Recent Labs  Lab 03/09/19 0532  WBC 4.9  HGB 10.2*  HCT 32.9*  PLT 126*    Chemistries  Recent Labs  Lab 03/08/19 0110 03/09/19 0532  NA  142 144  K 3.2* 3.1*  CL 110 116*  CO2 23 22  GLUCOSE 129* 87  BUN 12 11  CREATININE 1.26* 0.92  CALCIUM 8.6* 7.4*  MG  --  1.3*  AST 13*  --   ALT 12  --   ALKPHOS 56  --   BILITOT 0.4  --      Microbiology Results  Results for orders placed or performed during the hospital encounter of 03/08/19  CULTURE, BLOOD (ROUTINE X 2) w Reflex to ID Panel     Status: None (Preliminary result)   Collection Time: 03/08/19  8:49 AM  Result Value Ref Range Status   Specimen Description BLOOD RIGHT [facility]  Final   Special Requests   Final    BOTTLES DRAWN AEROBIC AND ANAEROBIC Blood Culture adequate volume   Culture   Final    NO GROWTH < 24 HOURS Performed at [facility], [name], [place], [place] [address]    Report Status PENDING  Incomplete   CULTURE, BLOOD (ROUTINE X 2) w Reflex to ID Panel     Status: None (Preliminary result)   Collection Time: 03/08/19  9:18 AM  Result Value Ref Range Status   Specimen Description BLOOD RIGHT HAND  Final   Special Requests   Final    BOTTLES DRAWN AEROBIC AND ANAEROBIC Blood Culture adequate volume   Culture   Final    NO GROWTH < 24 HOURS Performed at [facility], [address] [place], [place] [address]    Report Status PENDING  Incomplete  Urine Culture     Status: None   Collection Time: 03/08/19  3:10 PM  Result Value Ref Range Status   Specimen Description   Final    URINE, RANDOM Performed at [facility], [address] [place], [place] [address]    Special Requests   Final    NONE Performed at [facility], [address], [place],  [address]    Culture   Final    NO GROWTH Performed at [facility], [place] [address] [place],  [address]    Report Status 03/09/2019 FINAL  Final    RADIOLOGY:  Dg Wrist 2 Views Right  Result Date: 03/09/2019 CLINICAL DATA:  Right wrist pain after fall yesterday. EXAM: RIGHT WRIST - 2 VIEW COMPARISON:  Radiographs of March 07, 2017. FINDINGS: There is no evidence of fracture or dislocation. There is no evidence of arthropathy or other focal bone abnormality. Soft tissues are unremarkable. IMPRESSION: Negative. Electronically Signed   By: Marijo Conception M.D.   On: 03/09/2019 12:02     Management plans discussed with the patient, family and they are in agreement.  CODE STATUS:     Code Status Orders  (From admission, onward)         Start     Ordered   03/08/19 0751  Full code  Continuous     03/08/19 0753        Code Status History    Date Active Date Inactive Code Status Order ID Comments User Context   09/14/2018 0303 09/15/2018 1923 Full Code [id number]  Arta Silence, MD Inpatient   12/25/2017 1405 12/29/2017 1856 Full Code [id number]  Gladstone Lighter, MD  Inpatient   03/09/2017 2107 03/12/2017 1506 Full Code [id number]  Gonzella Lex, MD Inpatient   08/15/2016 0009 08/15/2016 1528 Full Code [id number]  Nena Polio, MD ED      TOTAL  TIME TAKING CARE OF THIS PATIENT: 36 minutes.    03/09/2019 at 2:50 PM   This patient was staffed with Dr. Stark Jock, Jude who personally evaluated patient, reviewed documentation and agreed with discharge plan of care as above.  Rufina Falco, DNP, FNP-BC Hospitalist Nurse Practitioner   Between 7am to 6pm - Pager [phone]  After 6pm go to [email or web address] - Proofreader  [facility]  Office  [phone]  CC: Primary care physician; System, Pcp Not In   Note: This dictation was prepared with Dragon dictation along with smaller phrase technology. Any transcriptional errors that result from this process are unintentional.

## 2019-03-09 NOTE — ED Notes (Signed)
ED Provider at bedside. 

## 2019-03-10 LAB — COMPREHENSIVE METABOLIC PANEL
ALT: 10 U/L (ref 0–44)
AST: 10 U/L — ABNORMAL LOW (ref 15–41)
Albumin: 3.5 g/dL (ref 3.5–5.0)
Alkaline Phosphatase: 52 U/L (ref 38–126)
Anion gap: 10 (ref 5–15)
BUN: 8 mg/dL (ref 6–20)
CO2: 20 mmol/L — ABNORMAL LOW (ref 22–32)
Calcium: 8.5 mg/dL — ABNORMAL LOW (ref 8.9–10.3)
Chloride: 109 mmol/L (ref 98–111)
Creatinine, Ser: 0.87 mg/dL (ref 0.61–1.24)
GFR calc Af Amer: >60 mL/min (ref >60)
GFR calc non Af Amer: >60 mL/min (ref >60)
Glucose, Bld: 130 mg/dL — ABNORMAL HIGH (ref 70–99)
Potassium: 3.3 mmol/L — ABNORMAL LOW (ref 3.5–5.1)
Sodium: 139 mmol/L (ref 135–145)
Total Bilirubin: 0.7 mg/dL (ref 0.3–1.2)
Total Protein: 6.3 g/dL — ABNORMAL LOW (ref 6.5–8.1)

## 2019-03-10 LAB — URINALYSIS, COMPLETE (UACMP) WITH MICROSCOPIC
Bacteria, UA: NONE SEEN
Bilirubin Urine: NEGATIVE
Glucose, UA: NEGATIVE mg/dL
Hgb urine dipstick: NEGATIVE
Ketones, ur: NEGATIVE mg/dL
Leukocytes,Ua: NEGATIVE
Nitrite: NEGATIVE
Protein, ur: NEGATIVE mg/dL
Specific Gravity, Urine: 1.006 (ref 1.005–1.030)
Squamous Epithelial / HPF: NONE SEEN (ref 0–5)
pH: 6 (ref 5.0–8.0)

## 2019-03-10 LAB — CBC WITH DIFFERENTIAL/PLATELET
Abs Immature Granulocytes: 0.03 10*3/uL (ref 0.00–0.07)
Basophils Absolute: 0 10*3/uL (ref 0.0–0.1)
Basophils Relative: 0 %
Eosinophils Absolute: 0.1 10*3/uL (ref 0.0–0.5)
Eosinophils Relative: 1 %
HCT: 35.1 % — ABNORMAL LOW (ref 39.0–52.0)
Hemoglobin: 11.2 g/dL — ABNORMAL LOW (ref 13.0–17.0)
Immature Granulocytes: 0 %
Lymphocytes Relative: 9 %
Lymphs Abs: 0.6 10*3/uL — ABNORMAL LOW (ref 0.7–4.0)
MCH: 27.5 pg (ref 26.0–34.0)
MCHC: 31.9 g/dL (ref 30.0–36.0)
MCV: 86 fL (ref 80.0–100.0)
Monocytes Absolute: 1.5 10*3/uL — ABNORMAL HIGH (ref 0.1–1.0)
Monocytes Relative: 20 %
Neutro Abs: 5.3 10*3/uL (ref 1.7–7.7)
Neutrophils Relative %: 70 %
Platelets: 153 10*3/uL (ref 150–400)
RBC: 4.08 MIL/uL — ABNORMAL LOW (ref 4.22–5.81)
RDW: 19.6 % — ABNORMAL HIGH (ref 11.5–15.5)
WBC: 7.5 10*3/uL (ref 4.0–10.5)
nRBC: 0 % (ref 0.0–0.2)

## 2019-03-10 LAB — TROPONIN I: Troponin I: <0.03 ng/mL (ref <0.03)

## 2019-03-10 LAB — ETHANOL: Alcohol, Ethyl (B): <10 mg/dL (ref <10)

## 2019-03-10 LAB — LACTIC ACID, PLASMA: Lactic Acid, Venous: 0.9 mmol/L (ref 0.5–1.9)

## 2019-03-10 MED ORDER — TRAMADOL HCL 50 MG PO TABS: 50.0000 mg | ORAL_TABLET | Freq: Four times a day (QID) | ORAL | 0 refills | Status: DC | PRN

## 2019-03-10 MED ORDER — TRAMADOL HCL 50 MG PO TABS
50.0000 mg | ORAL_TABLET | Freq: Once | ORAL | Status: AC
  Administered 2019-03-10: 50 mg via ORAL
  Filled 2019-03-10: qty 1

## 2019-03-10 NOTE — ED Notes (Signed)
Reviewed discharge instructions, follow-up care, and prescriptions with patient. Patient verbalized understanding of all information reviewed. Patient stable, with no distress noted at this time.    

## 2019-03-10 NOTE — ED Notes (Signed)
ED Provider at bedside. 

## 2019-03-10 NOTE — Discharge Instructions (Addendum)
1.  You may take Ibuprofen as needed for pain, Tramadol as needed for more severe pain. 2.  You may take Tylenol as needed for fever. 3.  Return to the ER for worsening symptoms, persistent vomiting, difficulty breathing or other concerns.

## 2019-03-11 LAB — URINE CULTURE: Culture: NO GROWTH

## 2019-03-13 LAB — CULTURE, BLOOD (ROUTINE X 2)
Culture: NO GROWTH
Culture: NO GROWTH
Special Requests: ADEQUATE
Special Requests: ADEQUATE

## 2019-03-15 LAB — CULTURE, BLOOD (ROUTINE X 2)
Culture: NO GROWTH
Culture: NO GROWTH
Special Requests: ADEQUATE
Special Requests: ADEQUATE

## 2019-03-16 ENCOUNTER — Encounter: Payer: Medicaid Other | Admitting: Gerontology

## 2019-03-16 ENCOUNTER — Other Ambulatory Visit: Payer: Self-pay

## 2019-03-16 NOTE — Progress Notes (Signed)
This encounter was created in error - please disregard.

## 2019-03-18 ENCOUNTER — Emergency Department: Payer: Self-pay

## 2019-03-18 ENCOUNTER — Encounter: Payer: Self-pay | Admitting: Emergency Medicine

## 2019-03-18 ENCOUNTER — Emergency Department
Admission: EM | Admit: 2019-03-18 | Discharge: 2019-03-19 | Disposition: A | Discharge status: Home / Self Care | Payer: Self-pay | Attending: Emergency Medicine | Admitting: Emergency Medicine

## 2019-03-18 DIAGNOSIS — IMO0002 Reserved for concepts with insufficient information to code with codable children: Secondary | ICD-10-CM

## 2019-03-18 DIAGNOSIS — Z7289 Other problems related to lifestyle: Secondary | ICD-10-CM | POA: Insufficient documentation

## 2019-03-18 DIAGNOSIS — Y92810 Car as the place of occurrence of the external cause: Secondary | ICD-10-CM | POA: Insufficient documentation

## 2019-03-18 DIAGNOSIS — Y999 Unspecified external cause status: Secondary | ICD-10-CM | POA: Insufficient documentation

## 2019-03-18 DIAGNOSIS — Y9389 Activity, other specified: Secondary | ICD-10-CM | POA: Insufficient documentation

## 2019-03-18 DIAGNOSIS — J45909 Unspecified asthma, uncomplicated: Secondary | ICD-10-CM | POA: Insufficient documentation

## 2019-03-18 DIAGNOSIS — Z046 Encounter for general psychiatric examination, requested by authority: Secondary | ICD-10-CM | POA: Insufficient documentation

## 2019-03-18 DIAGNOSIS — I10 Essential (primary) hypertension: Secondary | ICD-10-CM | POA: Insufficient documentation

## 2019-03-18 DIAGNOSIS — Y907 Blood alcohol level of 200-239 mg/100 ml: Secondary | ICD-10-CM | POA: Insufficient documentation

## 2019-03-18 DIAGNOSIS — R52 Pain, unspecified: Secondary | ICD-10-CM

## 2019-03-18 DIAGNOSIS — Z79899 Other long term (current) drug therapy: Secondary | ICD-10-CM | POA: Insufficient documentation

## 2019-03-18 DIAGNOSIS — S51811A Laceration without foreign body of right forearm, initial encounter: Secondary | ICD-10-CM | POA: Insufficient documentation

## 2019-03-18 DIAGNOSIS — Z87891 Personal history of nicotine dependence: Secondary | ICD-10-CM | POA: Insufficient documentation

## 2019-03-18 DIAGNOSIS — F10929 Alcohol use, unspecified with intoxication, unspecified: Secondary | ICD-10-CM

## 2019-03-18 DIAGNOSIS — F329 Major depressive disorder, single episode, unspecified: Secondary | ICD-10-CM | POA: Insufficient documentation

## 2019-03-18 DIAGNOSIS — X781XXA Intentional self-harm by knife, initial encounter: Secondary | ICD-10-CM | POA: Insufficient documentation

## 2019-03-18 LAB — CBC WITH DIFFERENTIAL/PLATELET
Abs Immature Granulocytes: 0.02 10*3/uL (ref 0.00–0.07)
Basophils Absolute: 0.1 10*3/uL (ref 0.0–0.1)
Basophils Relative: 1 %
Eosinophils Absolute: 0.1 10*3/uL (ref 0.0–0.5)
Eosinophils Relative: 2 %
HCT: 37.2 % — ABNORMAL LOW (ref 39.0–52.0)
Hemoglobin: 11.4 g/dL — ABNORMAL LOW (ref 13.0–17.0)
Immature Granulocytes: 0 %
Lymphocytes Relative: 27 %
Lymphs Abs: 2.1 10*3/uL (ref 0.7–4.0)
MCH: 27.1 pg (ref 26.0–34.0)
MCHC: 30.6 g/dL (ref 30.0–36.0)
MCV: 88.4 fL (ref 80.0–100.0)
Monocytes Absolute: 0.8 10*3/uL (ref 0.1–1.0)
Monocytes Relative: 10 %
Neutro Abs: 4.6 10*3/uL (ref 1.7–7.7)
Neutrophils Relative %: 60 %
Platelets: 449 10*3/uL — ABNORMAL HIGH (ref 150–400)
RBC: 4.21 MIL/uL — ABNORMAL LOW (ref 4.22–5.81)
RDW: 19.9 % — ABNORMAL HIGH (ref 11.5–15.5)
WBC: 7.6 10*3/uL (ref 4.0–10.5)
nRBC: 0 % (ref 0.0–0.2)

## 2019-03-18 LAB — COMPREHENSIVE METABOLIC PANEL
ALT: 10 U/L (ref 0–44)
AST: 12 U/L — ABNORMAL LOW (ref 15–41)
Albumin: 3.3 g/dL — ABNORMAL LOW (ref 3.5–5.0)
Alkaline Phosphatase: 49 U/L (ref 38–126)
Anion gap: 13 (ref 5–15)
BUN: 6 mg/dL (ref 6–20)
CO2: 21 mmol/L — ABNORMAL LOW (ref 22–32)
Calcium: 7.7 mg/dL — ABNORMAL LOW (ref 8.9–10.3)
Chloride: 112 mmol/L — ABNORMAL HIGH (ref 98–111)
Creatinine, Ser: 1 mg/dL (ref 0.61–1.24)
GFR calc Af Amer: >60 mL/min (ref >60)
GFR calc non Af Amer: >60 mL/min (ref >60)
Glucose, Bld: 88 mg/dL (ref 70–99)
Potassium: 3 mmol/L — ABNORMAL LOW (ref 3.5–5.1)
Sodium: 146 mmol/L — ABNORMAL HIGH (ref 135–145)
Total Bilirubin: 0.5 mg/dL (ref 0.3–1.2)
Total Protein: 6 g/dL — ABNORMAL LOW (ref 6.5–8.1)

## 2019-03-18 LAB — ETHANOL: Alcohol, Ethyl (B): 225 mg/dL — ABNORMAL HIGH (ref <10)

## 2019-03-18 MED ORDER — THIAMINE HCL 100 MG/ML IJ SOLN
100.0000 mg | Freq: Once | INTRAMUSCULAR | Status: AC
  Administered 2019-03-18: 100 mg via INTRAVENOUS
  Filled 2019-03-18: qty 2

## 2019-03-18 MED ORDER — SODIUM CHLORIDE 0.9 % IV BOLUS
1000.0000 mL | Freq: Once | INTRAVENOUS | Status: AC
  Administered 2019-03-18: 20:00:00 1000 mL via INTRAVENOUS

## 2019-03-18 MED ORDER — POTASSIUM CHLORIDE CRYS ER 20 MEQ PO TBCR
40.0000 meq | EXTENDED_RELEASE_TABLET | Freq: Once | ORAL | Status: AC
  Administered 2019-03-18: 23:00:00 40 meq via ORAL
  Filled 2019-03-18: qty 2

## 2019-03-18 MED ORDER — SODIUM CHLORIDE 0.9 % IV BOLUS
1000.0000 mL | Freq: Once | INTRAVENOUS | Status: AC
  Administered 2019-03-18: 1000 mL via INTRAVENOUS
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
## 2019-03-19 DIAGNOSIS — F10929 Alcohol use, unspecified with intoxication, unspecified: Secondary | ICD-10-CM | POA: Insufficient documentation

## 2019-03-19 DIAGNOSIS — F10920 Alcohol use, unspecified with intoxication, uncomplicated: Secondary | ICD-10-CM | POA: Insufficient documentation

## 2019-03-19 LAB — URINE DRUG SCREEN, QUALITATIVE (ARMC ONLY)
Amphetamines, Ur Screen: NOT DETECTED
Barbiturates, Ur Screen: NOT DETECTED
Benzodiazepine, Ur Scrn: POSITIVE — AB
Cannabinoid 50 Ng, Ur ~~LOC~~: NOT DETECTED
Cocaine Metabolite,Ur ~~LOC~~: NOT DETECTED
MDMA (Ecstasy)Ur Screen: NOT DETECTED
Methadone Scn, Ur: NOT DETECTED
Opiate, Ur Screen: NOT DETECTED
Phencyclidine (PCP) Ur S: NOT DETECTED
Tricyclic, Ur Screen: NOT DETECTED

## 2019-03-19 MED ORDER — CEPHALEXIN 500 MG PO CAPS: 500.0000 mg | ORAL_CAPSULE | Freq: Three times a day (TID) | ORAL | 0 refills | Status: DC

## 2019-03-19 NOTE — ED Provider Notes (Signed)
-----------------------------------------   8:21 AM on 03/19/2019 -----------------------------------------  he is awake and alert no acute distress evaluated by psychiatry, patient meets no criteria at this time for acute inpatient psychiatric care and does not want voluntary care, has no SI or HI, psychiatry is reversing his IVC and he would like me to discharge him.  Wounds are not significant arm does not appear infected, we will send him home however on a couple days of antibiotics as a precaution.   [name] Amor, MD 03/19/19 [phone]

## 2019-03-19 NOTE — ED Notes (Signed)
Pt given breakfast tray

## 2019-03-19 NOTE — Consult Note (Signed)
[facility] Consult   Reason for Consult:  Psych eval due to self-injury Referring Physician: DrVeronese Patient Identification: Miguel [name] MRN:  [id number] Principal Diagnosis: <principal problem not specified> Diagnosis:  Active Problems:   * No active hospital problems. *   Total Time spent with patient: 45 minutes  Subjective:   Miguel [name] is a 58 y.o. male patient admitted with male with a history of alcohol use disorder, who was brought to ER by EMS due to self-injuries. Psychiatry called for consult.  Workup from ER: BAL on arrival 225, U-tox positive for benzos (PDMP remarkable for Rx for Chlordiazepoxide 25mg  #15 for 5 days from 03/01/2019). X-ray R forearm: no evidence of fractures, soft tissue unremarkable. In ER patient received IVFs, thiamine; CIWA initiated; wounds cleaned and dressed.  HPI:  Patient reports that he was sober from drinking for last two-three weeks. Reports that his dad passed away three days ago and it triggered him to drink wine last night. He reports he got into argument with his wife about finances, got upset, drank more, went outside to his car, was listening the music and impulsively stabbed his right arm. He reports that the incident scared him and he was the one who asked for medical help. He denies he was feeling depressed overall lately; reports that he was able to process his father`s death. He denies he had any thoughts or plans of harming self prior to last night. He reports feeling happy now for being alive. He reports that he lives for his wife, they are together for 37 years, she is suffering from [place] and needs his daily care. Patient reports he sees last night event as a sign to change. He states he has an appointment with PCP in two days and with psychiatrist at [facility] in three days. He reports he is serious about sobriety at this time as he needs to take care of his wife. Patient denies feeling suicidal, homicidal. He  denies any hallucinations, does not express any delusions. He reports feeling upset due to last night event, but denies feeling depressed, he is hopeful and oriented for the future. He denies any current physical complaints and symptoms of acohol withdrawal.    PSYCH ROS: Safety: denies suicidal thoughts, denies homicidal thoughts. Depression: denies symptoms.    Anxiety: moderate worrying;  Psychosis: No symptoms;  Mania: no symptoms; PTSD: No symptoms. Dementia: no symptoms.  Delirium: no symptoms (such as waxing and waning confusion in a day; altered sensorium; impaired attention; speech problem).   PAST PSYCH HISTORY:  Previous psych diagnoses: alcohol use disorder Previous psychiatric hospitalizations: denies Outpatient psychiatrist: [facility], states he has an appointment in three days. History of prior suicide attempts: denies Non-suicidal self-injurious behaviors: denies prior to last night. History of violence: denies Current psych medications: denies    STATE PRESCRIPTION DRUG MONITORING PROGRAM: Chlordiazepoxide 25mg  #15 for 5 days from 03/01/2019; Tramadol.   SOCIAL HISTORY: -Patient has no guardian. -Adverse childhood experience: denies h/o physical, emotional, sexual abuse;  -Currently lives: with wife -Marital/relationships history:  Married for 37 years -Children: no -Education: HS grad -Work/finances: occasionally works as Dealer; -Legal history: denies current issues, being on probation, parole. -Guns in possession: denies    SUBSTANCE USE: Alcohol: 3 times a week; longest sobriety - 2-3 weeks; denies h/o DTs. 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 Illicit drug use: denies.    FAMILY HISTORY:   Patient denies a family history significant for suicide  in family members.     Risk to Self:  No Risk to Others:   Prior Inpatient Therapy:  no Prior Outpatient Therapy:  yes  Suicide Risk Assessment: male gender, Caucasian - represent  non-modifiable/baseline risk factors. Presence of alcohol/substance use is dynamic risk factor. The patient denies suicidal thoughts, denies a history of suicidal attempts, denies access to firearm. Patient is future-oriented, has responsibilities, help-seeking, has access to mental health care, has family support, has cultural beliefs that discourage suicide - all protective factors. Therefore, represents a low risk for harming self acutely and elevated chronic risk due to non-modifiable risk factors.   Violence Risk Assessment: male gender, substance use - are static risk factors predisposing to violence - place him at low to moderate chronic risk of harming others; though he is an acute low risk to others as he is not having any thoughts of wanting to hurt others and recognizes the consequences if he were be physically violent.      Past Medical History:  Past Medical History:  Diagnosis Date  . Alcohol abuse   . Asthma   . GERD (gastroesophageal reflux disease)   . Gout   . Hypertension   . Kidney stone   . OCD (obsessive compulsive disorder)   . Renal colic     Past Surgical History:  Procedure Laterality Date  . CHOLECYSTECTOMY  2012  . EXTRACORPOREAL SHOCK WAVE LITHOTRIPSY Left 01/12/2019   Procedure: EXTRACORPOREAL SHOCK WAVE LITHOTRIPSY (ESWL);  Surgeon: Billey Co, MD;  Location: [facility] ORS;  Service: Urology;  Laterality: Left;   Family History:  Family History  Problem Relation Age of Onset  . Alcohol abuse Father   . Breast cancer Mother 72   Family Psychiatric  History:see above Social History:  Social History   Substance and Sexual Activity  Alcohol Use Yes  . Alcohol/week: 7.0 standard drinks  . Types: 7 Glasses of wine per week   Comment: wine earlier today     Social History   Substance and Sexual Activity  Drug Use No    Social History   Socioeconomic History  . Marital status: Married    Spouse name: Not on file  . Number of children: Not on  file  . Years of education: Not on file  . Highest education level: Not on file  Occupational History  . Not on file  Social Needs  . Financial resource strain: Very hard  . Food insecurity:    Worry: Never true    Inability: Never true  . Transportation needs:    Medical: No    Non-medical: No  Tobacco Use  . Smoking status: Former Research scientist (life sciences)  . Smokeless tobacco: Never Used  . Tobacco comment: quit 30 years ago  Substance and Sexual Activity  . Alcohol use: Yes    Alcohol/week: 7.0 standard drinks    Types: 7 Glasses of wine per week    Comment: wine earlier today  . Drug use: No  . Sexual activity: Yes  Lifestyle  . Physical activity:    Days per week: 2 days    Minutes per session: 10 min  . Stress: Very much  Relationships  . Social connections:    Talks on phone: Once a week    Gets together: Not on file    Attends religious service: Never    Active member of club or organization: No    Attends meetings of clubs or organizations: Never    Relationship status: Married  Other  Topics Concern  . Not on file  Social History Narrative   Lives at home with his wife and takes care of his wife. Independent at baseline      - Biggest strain is financial but doesn't think they could got get more help.      Patient expressed interest in possible financial assistance. Informed written consent obtained   Additional Social History: see above    Allergies:   Allergies  Allergen Reactions  . Percocet [Oxycodone-Acetaminophen] Nausea And Vomiting    Labs:  Results for orders placed or performed during the hospital encounter of 03/18/19 (from the past 48 hour(s))  Ethanol     Status: Abnormal   Collection Time: 03/18/19  7:43 PM  Result Value Ref Range   Alcohol, Ethyl (B) 225 (H) <10 mg/dL    Comment: (NOTE) Lowest detectable limit for serum alcohol is 10 mg/dL. For medical purposes only. Performed at [facility], [address] [place], [place]  [address]   CBC with Differential/Platelet     Status: Abnormal   Collection Time: 03/18/19  7:43 PM  Result Value Ref Range   WBC 7.6 4.0 - 10.5 K/uL   RBC 4.21 (L) 4.22 - 5.81 MIL/uL   Hemoglobin 11.4 (L) 13.0 - 17.0 g/dL   HCT 37.2 (L) 39.0 - 52.0 %   MCV 88.4 80.0 - 100.0 fL   MCH 27.1 26.0 - 34.0 pg   MCHC 30.6 30.0 - 36.0 g/dL   RDW 19.9 (H) 11.5 - 15.5 %   Platelets 449 (H) 150 - 400 K/uL   nRBC 0.0 0.0 - 0.2 %   Neutrophils Relative % 60 %   Neutro Abs 4.6 1.7 - 7.7 K/uL   Lymphocytes Relative 27 %   Lymphs Abs 2.1 0.7 - 4.0 K/uL   Monocytes Relative 10 %   Monocytes Absolute 0.8 0.1 - 1.0 K/uL   Eosinophils Relative 2 %   Eosinophils Absolute 0.1 0.0 - 0.5 K/uL   Basophils Relative 1 %   Basophils Absolute 0.1 0.0 - 0.1 K/uL   Immature Granulocytes 0 %   Abs Immature Granulocytes 0.02 0.00 - 0.07 K/uL    Comment: Performed at [facility], [address] [place], [place] [address]  Comprehensive metabolic panel     Status: Abnormal   Collection Time: 03/18/19  7:43 PM  Result Value Ref Range   Sodium 146 (H) 135 - 145 mmol/L   Potassium 3.0 (L) 3.5 - 5.1 mmol/L   Chloride 112 (H) 98 - 111 mmol/L   CO2 21 (L) 22 - 32 mmol/L   Glucose, Bld 88 70 - 99 mg/dL   BUN 6 6 - 20 mg/dL   Creatinine, Ser 1.00 0.61 - 1.24 mg/dL   Calcium 7.7 (L) 8.9 - 10.3 mg/dL   Total Protein 6.0 (L) 6.5 - 8.1 g/dL   Albumin 3.3 (L) 3.5 - 5.0 g/dL   AST 12 (L) 15 - 41 U/L   ALT 10 0 - 44 U/L   Alkaline Phosphatase 49 38 - 126 U/L   Total Bilirubin 0.5 0.3 - 1.2 mg/dL   GFR calc non Af Amer >60 >60 mL/min   GFR calc Af Amer >60 >60 mL/min   Anion gap 13 5 - 15    Comment: Performed at [facility], [address], [place], [place] [address]  Urine Drug Screen, Qualitative ([facility] only)     Status: Abnormal   Collection Time: 03/19/19  2:56 AM  Result Value Ref Range   Tricyclic, Ur Screen NONE DETECTED NONE DETECTED   Amphetamines, Ur Screen NONE DETECTED NONE DETECTED    MDMA (Ecstasy)Ur Screen NONE DETECTED NONE DETECTED   Cocaine Metabolite,Ur [place] NONE DETECTED NONE DETECTED   Opiate, Ur Screen NONE DETECTED NONE DETECTED   Phencyclidine (PCP) Ur S NONE DETECTED NONE DETECTED   Cannabinoid 50 Ng, Ur [place] NONE DETECTED NONE DETECTED   Barbiturates, Ur Screen NONE DETECTED NONE DETECTED   Benzodiazepine, Ur Scrn POSITIVE (A) NONE DETECTED   Methadone Scn, Ur NONE DETECTED NONE DETECTED    Comment: (NOTE) Tricyclics + metabolites, urine    Cutoff 1000 ng/mL Amphetamines + metabolites, urine  Cutoff 1000 ng/mL MDMA (Ecstasy), urine              Cutoff 500 ng/mL Cocaine Metabolite, urine          Cutoff 300 ng/mL Opiate + metabolites, urine        Cutoff 300 ng/mL Phencyclidine (PCP), urine         Cutoff 25 ng/mL Cannabinoid, urine                 Cutoff 50 ng/mL Barbiturates + metabolites, urine  Cutoff 200 ng/mL Benzodiazepine, urine              Cutoff 200 ng/mL Methadone, urine                   Cutoff 300 ng/mL The urine drug screen provides only a preliminary, unconfirmed analytical test result and should not be used for non-medical purposes. Clinical consideration and professional judgment should be applied to any positive drug screen result due to possible interfering substances. A more specific alternate chemical method must be used in order to obtain a confirmed analytical result. Gas chromatography / mass spectrometry (GC/MS) is the preferred confirmat ory method. Performed at [facility], [address] [place], [place] [address]     No current facility-administered medications for this encounter.    Current Outpatient Medications  Medication Sig Dispense Refill  . albuterol (VENTOLIN HFA) 108 (90 Base) MCG/ACT inhaler Inhale 2 puffs into the lungs every 4 (four) hours as needed. (Patient taking differently: Inhale 2 puffs into the lungs every 4 (four) hours as needed for wheezing or shortness of breath. ) 72 g 0  .  allopurinol (ZYLOPRIM) 300 MG tablet Take 1 tablet (300 mg total) by mouth daily. 90 tablet 3  . cephALEXin (KEFLEX) 500 MG capsule Take 1 capsule (500 mg total) by mouth 3 (three) times daily for 4 days. 12 capsule 0  . chlordiazePOXIDE (LIBRIUM) 25 MG capsule Take 1 tablet by mouth 5 times a day on day 1, then decrease by 1 tablet daily until gone. 15 capsule 0  . colchicine 0.6 MG tablet Take 1 tablet (0.6 mg total) by mouth daily. 4 tablet 0  . docusate sodium (COLACE) 100 MG capsule Take 1 tablet once or twice daily as needed for constipation while taking narcotic pain medicine 30 capsule 0  . Fluticasone-Salmeterol (ADVAIR DISKUS) 100-50 MCG/DOSE AEPB Inhale 1 puff into the lungs 2 (two) times daily. 093 each 0  . folic acid (FOLVITE) 1 MG tablet Take 1 tablet (1 mg total) by mouth daily. 90 tablet 3  . lisinopril (PRINIVIL,ZESTRIL) 20 MG tablet Take 1 tablet (20 mg total) by mouth daily. 30 tablet 1  . Multiple Vitamins-Minerals (MULTIVITAMIN WITH MINERALS) tablet Take 1 tablet by mouth daily. 90 tablet 3  .  tiZANidine (ZANAFLEX) 4 MG tablet Take 0.5 tablets (2 mg total) by mouth every 8 (eight) hours as needed for muscle spasms. 30 tablet 0  . traMADol (ULTRAM) 50 MG tablet Take 1 tablet (50 mg total) by mouth every 6 (six) hours as needed. 20 tablet 0     Psychiatric Specialty Exam: Physical Exam  ROS  Blood pressure (!) 180/80, pulse 86, temperature 98.1 F (36.7 C), temperature source Oral, resp. rate 18, height 6' (1.829 m), weight 83.9 kg, SpO2 97 %.Body mass index is 25.09 kg/m.  General Appearance: Casual  Eye Contact:  Fair  Speech:  Normal Rate  Volume:  Normal  Mood:  Euthymic  Affect:  Appropriate and Congruent  Thought Process:  Coherent, Goal Directed and Linear  Orientation:  Full (Time, Place, and Person)  Thought Content:  Logical  Suicidal Thoughts:  No  Homicidal Thoughts:  No  Memory:  Immediate;   Fair Recent;   Fair Remote;   Fair  Judgement:  Good   Insight:  Fair  Psychomotor Activity:  Normal  Concentration:  Concentration: Fair and Attention Span: Fair  Recall:  [employer] of Knowledge:  Fair  Language:  Fair  Akathisia:  No  Handed:    AIMS (if indicated):     Assets:  Desire for Improvement Housing  ADL's:  Intact  Cognition:  WNL  Sleep:       Disposition: No evidence of imminent risk to self or others at present.   Patient does not meet criteria for psychiatric inpatient admission.   ASSESSMENT: Miguel [name] is a 35. male with a history of alcohol use disorder, who was brought to ER by EMS due to self-injuries. Psychiatry called for consult. Patient admits to stabbing his arm impulsively while intoxicated with alcohol. Today he denies feeling suicidal, homicidal. He denies any hallucinations, does not express any delusions He is hopeful and oriented for the future. He states he has an appointment with mental health in thee days to address his drinking.  During my assessment, pt is calm, cooperative, pleasant and logical, has no intentions of harming self or anybody, he does not appear to be acutely psychotic or at risk of harm to self or others. His alcohol use seems to be his chronic condition for which an acute hospitalization may not be of much benefit, the least restrictive setting for him would be to follow up outpatient to mitigate and treat his chronic condition.  Impression: Alcohol use disorder. Acute alcohol intoxication.  Recommendations: -No indication for inpatient psychiatric hospitalization -continue Thiamine, folic acid, multivitamins. -Patient has a list of outpatient resources -F/u with outpatient psychiatrist next week.  Larita Fife, MD 03/19/2019 9:52 AM

## 2019-03-19 NOTE — ED Notes (Signed)
Pt dressing for discharge.  IV site removed. Maintained on 15 minute checks and observation by security  for safety.

## 2019-03-19 NOTE — ED Notes (Addendum)
Patient discharged home, patient received discharge papers and prescription for Keflex. Patient received  belongings and verbalized he has received all of his belongings. Patient appropriate and cooperative, Denies SI/HI AVH. Vital signs taken. NAD noted.

## 2019-03-19 NOTE — ED Provider Notes (Signed)
-----------------------------------------   6:29 AM on 03/19/2019 -----------------------------------------   Blood pressure 109/72, pulse 94, temperature 98.1 F (36.7 C), temperature source Oral, resp. rate 18, height 6' (1.829 m), weight 83.9 kg, SpO2 95 %.  The patient is sleeping at this time.  There have been no acute events since the last update.  Awaiting disposition plan from Behavioral Medicine team.   Paulette Blanch, MD 03/19/19 [phone]

## 2019-03-19 NOTE — ED Notes (Signed)
Hourly rounding reveals patient sleeping in room. No complaints, stable, in no acute distress. Q15 minute rounds and monitoring via Security to continue. 

## 2019-03-20 ENCOUNTER — Other Ambulatory Visit: Payer: Self-pay

## 2019-03-20 ENCOUNTER — Emergency Department: Payer: Medicaid Other

## 2019-03-20 ENCOUNTER — Emergency Department
Admission: EM | Admit: 2019-03-20 | Discharge: 2019-03-20 | Disposition: A | Discharge status: Home / Self Care | Payer: Medicaid Other | Attending: Emergency Medicine | Admitting: Emergency Medicine

## 2019-03-20 DIAGNOSIS — M10431 Other secondary gout, right wrist: Secondary | ICD-10-CM | POA: Insufficient documentation

## 2019-03-20 DIAGNOSIS — J45909 Unspecified asthma, uncomplicated: Secondary | ICD-10-CM | POA: Insufficient documentation

## 2019-03-20 DIAGNOSIS — Z79899 Other long term (current) drug therapy: Secondary | ICD-10-CM | POA: Insufficient documentation

## 2019-03-20 DIAGNOSIS — E860 Dehydration: Secondary | ICD-10-CM | POA: Insufficient documentation

## 2019-03-20 DIAGNOSIS — Z87891 Personal history of nicotine dependence: Secondary | ICD-10-CM | POA: Insufficient documentation

## 2019-03-20 DIAGNOSIS — I10 Essential (primary) hypertension: Secondary | ICD-10-CM | POA: Insufficient documentation

## 2019-03-20 DIAGNOSIS — N22 Calculus of urinary tract in diseases classified elsewhere: Secondary | ICD-10-CM | POA: Insufficient documentation

## 2019-03-20 DIAGNOSIS — R55 Syncope and collapse: Secondary | ICD-10-CM | POA: Insufficient documentation

## 2019-03-20 LAB — URINALYSIS, COMPLETE (UACMP) WITH MICROSCOPIC
Bacteria, UA: NONE SEEN
Bilirubin Urine: NEGATIVE
Glucose, UA: NEGATIVE mg/dL
Hgb urine dipstick: NEGATIVE
Ketones, ur: NEGATIVE mg/dL
Leukocytes,Ua: NEGATIVE
Nitrite: NEGATIVE
Protein, ur: NEGATIVE mg/dL
Specific Gravity, Urine: 1.002 — ABNORMAL LOW (ref 1.005–1.030)
Squamous Epithelial / LPF: NONE SEEN (ref 0–5)
pH: 6 (ref 5.0–8.0)

## 2019-03-20 LAB — CK: Total CK: 21 U/L — ABNORMAL LOW (ref 49–397)

## 2019-03-20 LAB — COMPREHENSIVE METABOLIC PANEL
ALT: 10 U/L (ref 0–44)
AST: 10 U/L — ABNORMAL LOW (ref 15–41)
Albumin: 3 g/dL — ABNORMAL LOW (ref 3.5–5.0)
Alkaline Phosphatase: 52 U/L (ref 38–126)
Anion gap: 11 (ref 5–15)
BUN: 6 mg/dL (ref 6–20)
CO2: 23 mmol/L (ref 22–32)
Calcium: 7.5 mg/dL — ABNORMAL LOW (ref 8.9–10.3)
Chloride: 107 mmol/L (ref 98–111)
Creatinine, Ser: 0.9 mg/dL (ref 0.61–1.24)
GFR calc Af Amer: >60 mL/min (ref >60)
GFR calc non Af Amer: >60 mL/min (ref >60)
Glucose, Bld: 127 mg/dL — ABNORMAL HIGH (ref 70–99)
Potassium: 3.1 mmol/L — ABNORMAL LOW (ref 3.5–5.1)
Sodium: 141 mmol/L (ref 135–145)
Total Bilirubin: 0.3 mg/dL (ref 0.3–1.2)
Total Protein: 5.2 g/dL — ABNORMAL LOW (ref 6.5–8.1)

## 2019-03-20 LAB — URINE DRUG SCREEN, QUALITATIVE (ARMC ONLY)
Amphetamines, Ur Screen: NOT DETECTED
Barbiturates, Ur Screen: NOT DETECTED
Benzodiazepine, Ur Scrn: POSITIVE — AB
Cannabinoid 50 Ng, Ur ~~LOC~~: NOT DETECTED
Cocaine Metabolite,Ur ~~LOC~~: NOT DETECTED
MDMA (Ecstasy)Ur Screen: NOT DETECTED
Methadone Scn, Ur: NOT DETECTED
Opiate, Ur Screen: NOT DETECTED
Phencyclidine (PCP) Ur S: NOT DETECTED
Tricyclic, Ur Screen: NOT DETECTED

## 2019-03-20 LAB — CBC WITH DIFFERENTIAL/PLATELET
Abs Immature Granulocytes: 0.03 10*3/uL (ref 0.00–0.07)
Basophils Absolute: 0.1 10*3/uL (ref 0.0–0.1)
Basophils Relative: 1 %
Eosinophils Absolute: 0.1 10*3/uL (ref 0.0–0.5)
Eosinophils Relative: 1 %
HCT: 33.6 % — ABNORMAL LOW (ref 39.0–52.0)
Hemoglobin: 10.6 g/dL — ABNORMAL LOW (ref 13.0–17.0)
Immature Granulocytes: 0 %
Lymphocytes Relative: 17 %
Lymphs Abs: 1.4 10*3/uL (ref 0.7–4.0)
MCH: 27.7 pg (ref 26.0–34.0)
MCHC: 31.5 g/dL (ref 30.0–36.0)
MCV: 87.7 fL (ref 80.0–100.0)
Monocytes Absolute: 0.8 10*3/uL (ref 0.1–1.0)
Monocytes Relative: 9 %
Neutro Abs: 6.2 10*3/uL (ref 1.7–7.7)
Neutrophils Relative %: 72 %
Platelets: 340 10*3/uL (ref 150–400)
RBC: 3.83 MIL/uL — ABNORMAL LOW (ref 4.22–5.81)
RDW: 18.4 % — ABNORMAL HIGH (ref 11.5–15.5)
WBC: 8.7 10*3/uL (ref 4.0–10.5)
nRBC: 0 % (ref 0.0–0.2)

## 2019-03-20 LAB — URIC ACID: Uric Acid, Serum: 7.4 mg/dL (ref 3.7–8.6)

## 2019-03-20 LAB — ETHANOL: Alcohol, Ethyl (B): 217 mg/dL — ABNORMAL HIGH (ref <10)

## 2019-03-20 LAB — LACTIC ACID, PLASMA
Lactic Acid, Venous: 3.2 mmol/L (ref 0.5–1.9)
Lactic Acid, Venous: 1.8 mmol/L (ref 0.5–1.9)

## 2019-03-20 LAB — BRAIN NATRIURETIC PEPTIDE: B Natriuretic Peptide: 322 pg/mL — ABNORMAL HIGH (ref 0.0–100.0)

## 2019-03-20 LAB — TROPONIN I: Troponin I: <0.03 ng/mL (ref <0.03)

## 2019-03-20 MED ORDER — ONDANSETRON 4 MG PO TBDP
ORAL_TABLET | ORAL | Status: AC
  Filled 2019-03-20: qty 1

## 2019-03-20 MED ORDER — PREDNISONE 10 MG PO TABS: 10.0000 mg | ORAL_TABLET | Freq: Every day | ORAL | 0 refills | Status: DC

## 2019-03-20 MED ORDER — HYDROCODONE-ACETAMINOPHEN 5-325 MG PO TABS
1.0000 | ORAL_TABLET | Freq: Once | ORAL | Status: AC
  Administered 2019-03-20: 1 via ORAL
  Filled 2019-03-20: qty 1

## 2019-03-20 MED ORDER — ONDANSETRON 4 MG PO TBDP
8.0000 mg | ORAL_TABLET | Freq: Once | ORAL | Status: AC
  Administered 2019-03-20: 8 mg via ORAL
  Filled 2019-03-20: qty 2

## 2019-03-20 MED ORDER — SODIUM CHLORIDE 0.9 % IV BOLUS
1000.0000 mL | Freq: Once | INTRAVENOUS | Status: AC
  Administered 2019-03-20: 1000 mL via INTRAVENOUS

## 2019-03-20 MED ORDER — SODIUM CHLORIDE 0.9 % IV BOLUS
1000.0000 mL | Freq: Once | INTRAVENOUS | Status: AC
  Administered 2019-03-20: 15:00:00 1000 mL via INTRAVENOUS

## 2019-03-20 NOTE — ED Triage Notes (Signed)
Pt. C/o right arm pain from elbow to wrist. Went to urgent care and c/o being faint. To ED via [facility].

## 2019-03-20 NOTE — ED Provider Notes (Signed)
[facility] Emergency Department Provider Note  ____________________________________________  Time seen: Approximately 2:41 PM  I have reviewed the triage vital signs and the nursing notes.   HISTORY  Chief Complaint Near Syncope    HPI Miguel [name] is a 58 y.o. male who presents the emergency department via EMS for complaint of "not feeling good."  Patient is well-known to this department, has been seen multiple times over the past week.   Approximately 2 weeks ago, patient was admitted for hypotension with elevated lactic.  Patient had presented with similar complaints of weakness.  Patient had been intoxicated and sustained a fall during that encounter.  More recently, patient had been diagnosed with gout after he had a positive uric acid and placed on colchicine.  Patient then became in a heated argument with his wife, became despondent sat in his car and inflicted four superficial lacerations to the right arm.  Patient reports that the pain in his arm continued and he sought care at urgent care today.  He reports that he was in the process of being discharged from urgent care when he became exceptionally weak, lightheaded and felt like he was going to pass out.  Patient denies any recent trauma.  Patient denies any headache, visual changes, chest pain, abdominal pain, nausea vomiting.  Patient has multiple musculoskeletal complaints of pain to multiple joints however.  Patient does have a history of alcohol abuse, GERD, gout, hypertension.        Past Medical History:  Diagnosis Date  . Alcohol abuse   . Asthma   . GERD (gastroesophageal reflux disease)   . Gout   . Hypertension   . Kidney stone   . OCD (obsessive compulsive disorder)   . Renal colic     Patient Active Problem List   Diagnosis Date Noted  . Alcoholic intoxication with complication ([place])   . Sepsis ([place]) 03/08/2019  . Breast lump or mass 10/04/2018  . Sepsis secondary to UTI ([place])  09/14/2018  . Asthma 07/07/2018  . GERD (gastroesophageal reflux disease) 07/07/2018  . OCD (obsessive compulsive disorder) 07/07/2018  . Self-inflicted laceration of wrist 03/10/2018  . Leg hematoma 12/25/2017  . Suicide and self-inflicted injury by cutting and piercing instrument ([place]) 03/10/2017  . Severe recurrent major 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 ([place]) 03/09/2017  . Substance induced mood disorder ([place]) 08/15/2016  . Involuntary commitment 08/15/2016  . Alcohol use disorder, severe, dependence ([place]) 02/05/2016  . Hypertension 12/05/2015  . Tachycardia 12/05/2015  . Gout 11/13/2015  . Chronic back pain 05/02/2015    Past Surgical History:  Procedure Laterality Date  . CHOLECYSTECTOMY  2012  . EXTRACORPOREAL SHOCK WAVE LITHOTRIPSY Left 01/12/2019   Procedure: EXTRACORPOREAL SHOCK WAVE LITHOTRIPSY (ESWL);  Surgeon: Billey Co, MD;  Location: [facility] ORS;  Service: Urology;  Laterality: Left;    Prior to Admission medications   Medication Sig Start Date End Date Taking? Authorizing Provider  albuterol (VENTOLIN HFA) 108 (90 Base) MCG/ACT inhaler Inhale 2 puffs into the lungs every 4 (four) hours as needed. Patient taking differently: Inhale 2 puffs into the lungs every 4 (four) hours as needed for wheezing or shortness of breath.  09/01/18   Tukov-Yual, Arlyss Gandy, NP  allopurinol (ZYLOPRIM) 300 MG tablet Take 1 tablet (300 mg total) by mouth daily. 09/01/18   Tukov-Yual, Arlyss Gandy, NP  cephALEXin (KEFLEX) 500 MG capsule Take 1 capsule (500 mg total) by mouth 3 (three) times daily for 4 days. 03/19/19 03/23/19  McShane,  Gerda Diss, MD  chlordiazePOXIDE (LIBRIUM) 25 MG capsule Take 1 tablet by mouth 5 times a day on day 1, then decrease by 1 tablet daily until gone. 03/01/19   Earleen Newport, MD  colchicine 0.6 MG tablet Take 1 tablet (0.6 mg total) by mouth daily. 03/09/19   Lang Snow, NP  docusate sodium (COLACE) 100 MG capsule Take 1 tablet once or twice  daily as needed for constipation while taking narcotic pain medicine 01/09/19   Hinda Kehr, MD  Fluticasone-Salmeterol (ADVAIR DISKUS) 100-50 MCG/DOSE AEPB Inhale 1 puff into the lungs 2 (two) times daily. 09/01/18   Tukov-Yual, Arlyss Gandy, NP  folic acid (FOLVITE) 1 MG tablet Take 1 tablet (1 mg total) by mouth daily. 09/01/18   Tukov-Yual, Arlyss Gandy, NP  lisinopril (PRINIVIL,ZESTRIL) 20 MG tablet Take 1 tablet (20 mg total) by mouth daily. 02/21/19   Iloabachie, Chioma E, NP  Multiple Vitamins-Minerals (MULTIVITAMIN WITH MINERALS) tablet Take 1 tablet by mouth daily. 09/01/18   Tukov-Yual, Arlyss Gandy, NP  predniSONE (DELTASONE) 10 MG tablet Take 1 tablet (10 mg total) by mouth daily. 03/20/19   Cuthriell, Charline Bills, PA-C  tiZANidine (ZANAFLEX) 4 MG tablet Take 0.5 tablets (2 mg total) by mouth every 8 (eight) hours as needed for muscle spasms. 02/21/19   Iloabachie, Chioma E, NP  traMADol (ULTRAM) 50 MG tablet Take 1 tablet (50 mg total) by mouth every 6 (six) hours as needed. 03/10/19   Paulette Blanch, MD    Allergies Percocet [oxycodone-acetaminophen]  Family History  Problem Relation Age of Onset  . Alcohol abuse Father   . Breast cancer Mother 56    Social History Social History   Tobacco Use  . Smoking status: Former Research scientist (life sciences)  . Smokeless tobacco: Never Used  . Tobacco comment: quit 30 years ago  Substance Use Topics  . Alcohol use: Yes    Alcohol/week: 2.0 standard drinks    Types: 2 Glasses of wine per week    Comment: with lunch  . Drug use: No     Review of Systems  Constitutional: No fever/chills.  General weakness. Eyes: No visual changes. No discharge ENT: No upper respiratory complaints. Cardiovascular: no chest pain. Respiratory: no cough. No SOB. Gastrointestinal: No abdominal pain.  No nausea, no vomiting.  No diarrhea.  No constipation. Genitourinary: Negative for dysuria. No hematuria Musculoskeletal: Right arm pain, left knee, bilateral ankle, right foot  pain. Skin: Negative for rash, abrasions, lacerations, ecchymosis. Neurological: Negative for headaches, focal weakness or numbness.  Near syncope. 10-point ROS otherwise negative.  ____________________________________________   PHYSICAL EXAM:  VITAL SIGNS: ED Triage Vitals  Enc Vitals Group     BP 03/20/19 1437 (!) 92/58     Pulse Rate 03/20/19 1437 69     Resp 03/20/19 1437 15     Temp 03/20/19 1437 98.6 F (37 C)     Temp Source 03/20/19 1437 Oral     SpO2 03/20/19 1437 96 %     Weight 03/20/19 1439 185 lb (83.9 kg)     Height 03/20/19 1439 6' (1.829 m)     Head Circumference --      Peak Flow --      Pain Score --      Pain Loc --      Pain Edu? --      Excl. in [place]? --      Constitutional: Alert and oriented. Well appearing and in no acute distress. Eyes: Conjunctivae are normal. PERRL.  EOMI. Head: Atraumatic. ENT:      Ears:       Nose: No congestion/rhinnorhea.      Mouth/Throat: Mucous membranes are moist.  Neck: No stridor.  Neck is supple full range of motion Hematological/Lymphatic/Immunilogical: No cervical lymphadenopathy. Cardiovascular: Normal rate, regular rhythm. Normal S1 and S2.  Good peripheral circulation. Respiratory: Normal respiratory effort without tachypnea or retractions. Lungs CTAB. Good air entry to the bases with no decreased or absent breath sounds. Gastrointestinal: Bowel sounds 4 quadrants. Soft and nontender to palpation. No guarding or rigidity. No palpable masses. No distention. No CVA tenderness. Musculoskeletal: Full range of motion to all extremities. No gross deformities appreciated.  Patient is tender to palpation diffusely over the right arm from the elbow to the right wrist.  Patient does have superficial abrasions that were not self-inflicted and treated at a previous encounter.  No gross signs of surrounding erythema or edema concerning for infection.  No ballottement of the elbow.  No gross edema of the wrist.  Patient is able  to flex, extend the elbow joint, good supination and pronation as well as flexion and extension of the wrist.  Sensation intact all 5 digits.  Examination of the shoulder is unremarkable. Neurologic:  Normal speech and language. No gross focal neurologic deficits are appreciated.  Cranial nerves II through XII grossly intact.  Negative Romberg's and pronator drift. Skin:  Skin is warm, dry and intact. No rash noted. Psychiatric: Mood and affect are normal. Speech and behavior are normal. Patient exhibits appropriate insight and judgement.   ____________________________________________   LABS (all labs ordered are listed, but only abnormal results are displayed)  Labs Reviewed  COMPREHENSIVE METABOLIC PANEL - Abnormal; Notable for the following components:      Result Value   Potassium 3.1 (*)    Glucose, Bld 127 (*)    Calcium 7.5 (*)    Total Protein 5.2 (*)    Albumin 3.0 (*)    AST 10 (*)    All other components within normal limits  ETHANOL - Abnormal; Notable for the following components:   Alcohol, Ethyl (B) 217 (*)    All other components within normal limits  BRAIN NATRIURETIC PEPTIDE - Abnormal; Notable for the following components:   B Natriuretic Peptide 322.0 (*)    All other components within normal limits  CBC WITH DIFFERENTIAL/PLATELET - Abnormal; Notable for the following components:   RBC 3.83 (*)    Hemoglobin 10.6 (*)    HCT 33.6 (*)    RDW 18.4 (*)    All other components within normal limits  LACTIC ACID, PLASMA - Abnormal; Notable for the following components:   Lactic Acid, Venous 3.2 (*)    All other components within normal limits  URINALYSIS, COMPLETE (UACMP) WITH MICROSCOPIC - Abnormal; Notable for the following components:   Color, Urine STRAW (*)    APPearance CLEAR (*)    Specific Gravity, Urine 1.002 (*)    All other components within normal limits  URINE DRUG SCREEN, QUALITATIVE ([facility] ONLY) - Abnormal; Notable for the following components:    Benzodiazepine, Ur Scrn POSITIVE (*)    All other components within normal limits  CK - Abnormal; Notable for the following components:   Total CK 21 (*)    All other components within normal limits  TROPONIN I  LACTIC ACID, PLASMA  URIC ACID  LACTIC ACID, PLASMA   ____________________________________________  EKG   ____________________________________________  RADIOLOGY I personally viewed and evaluated these images  as part of my medical decision making, as well as reviewing the written report by the radiologist.  Dg Chest 2 View  Result Date: 03/20/2019 CLINICAL DATA:  Near-syncope.  Right elbow pain. EXAM: CHEST - 2 VIEW COMPARISON:  PA and lateral chest 11/20/2018. CT chest, abdomen and pelvis 03/08/2019. FINDINGS: Lung volumes are low with mild left basilar atelectasis. Heart size is upper normal. No pneumothorax or pleural effusion. No acute or focal bony abnormality. IMPRESSION: No acute disease.  Subsegmental atelectasis left lung base noted. Electronically Signed   By: Inge Rise M.D.   On: 03/20/2019 15:13   Dg Forearm Right  Result Date: 03/20/2019 CLINICAL DATA:  Right upper extremity pain. EXAM: RIGHT FOREARM - 2 VIEW COMPARISON:  Radiographs of Mar 18, 2019. FINDINGS: There is no evidence of fracture or other focal bone lesions. Soft tissues are unremarkable. IMPRESSION: Negative. Electronically Signed   By: Marijo Conception M.D.   On: 03/20/2019 15:14   Ct Head Wo Contrast  Result Date: 03/20/2019 CLINICAL DATA:  Right arm pain extending into the forearm. Syncopal or near syncopal episode. EXAM: CT HEAD WITHOUT CONTRAST TECHNIQUE: Contiguous axial images were obtained from the base of the skull through the vertex without intravenous contrast. COMPARISON:  03/08/2019 FINDINGS: Brain: Mild chronic volume loss. No sign of acute infarction, mass lesion, hemorrhage hydrocephalus or extra-axial collection. There are probably mild chronic small-vessel ischemic changes of the  white matter an old lacunar insult in the left thalamus. Vascular: No abnormal vascular finding. Skull: Negative Sinuses/Orbits: Clear/normal Other: None IMPRESSION: No acute finding. Mild age related volume loss. Mild chronic small-vessel change of the white matter and left thalamus. Electronically Signed   By: Nelson Chimes M.D.   On: 03/20/2019 15:18    ____________________________________________    PROCEDURES  Procedure(s) performed:    Procedures    Medications  ondansetron (ZOFRAN-ODT) 4 MG disintegrating tablet (has no administration in time range)  HYDROcodone-acetaminophen (NORCO/VICODIN) 5-325 MG per tablet 1 tablet (has no administration in time range)  sodium chloride 0.9 % bolus 1,000 mL (0 mLs Intravenous Stopped 03/20/19 1732)  sodium chloride 0.9 % bolus 1,000 mL (1,000 mLs Intravenous New Bag/Given 03/20/19 1733)  HYDROcodone-acetaminophen (NORCO/VICODIN) 5-325 MG per tablet 1 tablet (1 tablet Oral Given 03/20/19 1733)  ondansetron (ZOFRAN-ODT) disintegrating tablet 8 mg (8 mg Oral Given 03/20/19 1745)     ____________________________________________   INITIAL IMPRESSION / ASSESSMENT AND PLAN / ED COURSE  Pertinent labs & imaging results that were available during my care of the patient were reviewed by me and considered in my medical decision making (see chart for details).  Review of the [place] CSRS was performed in accordance of the [place] prior to dispensing any controlled drugs.   The patient was evaluated for the symptoms described in the history of present illness. The patient was evaluated in the context of the global COVID-19 pandemic, which necessitated consideration that the patient might be at risk for infection with the SARS-CoV-2 virus that causes COVID-19. Institutional protocols and algorithms that pertain to the evaluation of patients at risk for COVID-19 are in a state of rapid change based on information released by regulatory bodies including the CDC and  federal and state organizations. The most current policies and algorithms were followed during the patient's care in the ED.              Patient's diagnosis is consistent with near syncope, dehydration, acute gout of the wrist.  Patient presented to the  emergency department via EMS after near syncopal episode in urgent care.  Patient has been experiencing right elbow and wrist pain and was evaluated at urgent care for same.  On time of discharge, patient had a near syncopal episode and EMS was called.  Overall, exam was reassuring at the time of assessment.  Given patient's symptoms he was evaluated with labs, imaging.  Work-up returns with reassuring results.  Patient did have an elevated lactic, but after fluid hydration this resolved.  I suspect that the lactic is high given his alcohol status and mild ensuing dehyrdation.  Patient has had previously elevated lactic acid to without signs of acute infection.  I do not have a suspicion at this time of significant underlying infection.  Again, lactic returns to normal limits with fluid hydration.  I discussed the patient's wrist and elbow pain is likely secondary to gout.  Patient takes allopurinol but only will take 2 days of colchicine as he is afraid that colchicine may hurt him.  Patient will be placed on a steroid taper for his gout.  Patient is encouraged to drink plenty of fluids.  Patient is stable for discharge at this time..  Follow-up with primary care.  Patient is given ED precautions to return to the ED for any worsening or new symptoms.     ____________________________________________  FINAL CLINICAL IMPRESSION(S) / ED DIAGNOSES  Final diagnoses:  Near syncope  Dehydration  Other secondary acute gout of right wrist      NEW MEDICATIONS STARTED DURING THIS VISIT:  ED Discharge Orders         Ordered    predniSONE (DELTASONE) 10 MG tablet  Daily    Note to Pharmacy:  Take 6 pills x 2 days, 5 pills x 2 days, 4 pills x 2  days, 3 pills x 2 days, 2 pills x 2 days, and 1 pill x 2 days   03/20/19 1956              This chart was dictated using voice recognition software/Dragon. Despite best efforts to proofread, errors can occur which can change the meaning. Any change was purely unintentional.    Brynda Peon 03/20/19 2002    Carrie Mew, MD 03/20/19 [phone]

## 2019-03-21 ENCOUNTER — Ambulatory Visit: Payer: No Typology Code available for payment source | Admitting: Licensed Clinical Social Worker

## 2019-03-21 DIAGNOSIS — F1020 Alcohol dependence, uncomplicated: Secondary | ICD-10-CM

## 2019-03-21 NOTE — BH Specialist Note (Signed)
Integrated Behavioral Health Follow Up Phone Visit  MRN: [id number] Name: Miguel [name]  Number of [place] Clinician visits: 2/6  Type of Service: [place] Interpretor:No. Interpretor Name and Language: Not applicable.  SUBJECTIVE: Miguel [name] is a 58 y.o. male accompanied by himself. Patient was referred by Carlyon Shadow, NP at [facility] for substance abuse. Patient reports the following symptoms/concerns: He reports that the main reason for his two most recent visits to the ER were due to flare ups with his gout. He admits to drinking yesterday half a pint of vodka. He admits the last four trips to the ER he was under the influence of alcohol. He explains that the arguments with his wife are over finances and his issues with drinking. He explains that the only time he has thoughts about hurting himself is when he is drinking. He denies suicidal and homicidal thoughts.  Duration of problem:; Severity of problem: mild  OBJECTIVE: Mood: Euthymic and Affect: Appropriate Risk of harm to self or others: No plan to harm self or others  LIFE CONTEXT: Family and Social: see above. School/Work:see above. Self-Care:see above. Life Changes: see above.  GOALS ADDRESSED: Patient will: 1.  Reduce symptoms of: alcohol abuse  2.  Increase knowledge and/or ability of: healthy habits, self-management skills and stress reduction  3.  Demonstrate ability to: Increase healthy adjustment to current life circumstances  INTERVENTIONS: Interventions utilized:  Motivational Interviewing was utilized by the clinician focusing on the patient's recent visits to the ER and substance abuse. Clinician processed with the patient regarding how she has noticed that since their last follow up session that he has been to the emergency department at least four times between 03/09/2019 to 03/20/2019 for substance abuse, self inflicted injuries, and falls.  Clinician asked the patient when was the last time he drank alcohol. Clinician explained to the patient that drinking is causing frequent trips to the ER, arguments with his wife, self inflicted injuries, and money is tight. Clinician explained to the patient that its clear that he does not have control with his drinking. Clinician explained that Dr. Octavia Heir, MD, Psychiatric consultant recommended that he be prescribed Naltrexone 25 mg starting dosage daily to block the neurotransmitters in his brain that cause him to crave alcohol.  Standardized Assessments completed: GAD-7 and PHQ 9  ASSESSMENT: Patient currently experiencing see above see above .   Patient may benefit from see above.  PLAN: 1. Follow up with behavioral health clinician on : two weeks or earlier if needed. 2. Behavioral recommendations: Tuesday May 12th Case consultation with Dr. Octavia Heir, [place], Psychiatric consultant.  3. Referral(s): [place] (In Clinic) 4. "From scale of 1-10, how likely are you to follow plan?":   Bayard Hugger, LCSW

## 2019-03-22 ENCOUNTER — Encounter: Payer: Self-pay | Admitting: Gerontology

## 2019-03-22 ENCOUNTER — Other Ambulatory Visit: Payer: Self-pay

## 2019-03-22 ENCOUNTER — Ambulatory Visit: Payer: Medicaid Other | Admitting: Gerontology

## 2019-03-22 DIAGNOSIS — F1020 Alcohol dependence, uncomplicated: Secondary | ICD-10-CM

## 2019-03-22 DIAGNOSIS — R252 Cramp and spasm: Secondary | ICD-10-CM

## 2019-03-22 DIAGNOSIS — I10 Essential (primary) hypertension: Secondary | ICD-10-CM

## 2019-03-22 MED ORDER — TIZANIDINE HCL 4 MG PO TABS: 2.0000 mg | ORAL_TABLET | Freq: Three times a day (TID) | ORAL | 0 refills | Status: DC | PRN

## 2019-03-22 MED ORDER — NALTREXONE HCL 50 MG PO TABS: 25.0000 mg | ORAL_TABLET | Freq: Every day | ORAL | 0 refills | Status: DC

## 2019-03-22 NOTE — Progress Notes (Signed)
Established Patient Office Visit  Subjective:  Patient ID: Miguel [name], male    DOB: 1961-09-11  Age: 58 y.o. MRN: [id number]  CC:  Chief Complaint  Patient presents with  . Follow-up   Patient consents to telephone visit and 2 patient identifier was used to identify patient.  HPI Miguel [name] presents for follow up after multiple ED visits for inflicting himself with four superficial laceration to the right arm on 03/18/2019 , near syncope on 03/20/2019 and Alcohol abuse. He reports that the laceration to his right arm is 95% healed and denies pain. He states that he's doing good and denies light headedness. He checks his blood pressure at home and he states that it was 138/68 this morning. He reports that he has not had any alcoholic beverage for the past 2 days, and he denies any peripheral tremor or withdrawal symptoms. He states that his mood is good and denies any suicidal or homicidal ideation. He has no fever, chills, chest pain, palpitation and no further concerns.  Past Medical History:  Diagnosis Date  . Alcohol abuse   . Asthma   . GERD (gastroesophageal reflux disease)   . Gout   . Hypertension   . Kidney stone   . OCD (obsessive compulsive disorder)   . Renal colic     Past Surgical History:  Procedure Laterality Date  . CHOLECYSTECTOMY  2012  . EXTRACORPOREAL SHOCK WAVE LITHOTRIPSY Left 01/12/2019   Procedure: EXTRACORPOREAL SHOCK WAVE LITHOTRIPSY (ESWL);  Surgeon: Billey Co, MD;  Location: [facility] ORS;  Service: Urology;  Laterality: Left;    Family History  Problem Relation Age of Onset  . Alcohol abuse Father   . Breast cancer Mother 97    Social History   Socioeconomic History  . Marital status: Married    Spouse name: Not on file  . Number of children: Not on file  . Years of education: Not on file  . Highest education level: Not on file  Occupational History  . Not on file  Social Needs  . Financial resource strain: Very hard  . Food  insecurity:    Worry: Never true    Inability: Never true  . Transportation needs:    Medical: No    Non-medical: No  Tobacco Use  . Smoking status: Former Research scientist (life sciences)  . Smokeless tobacco: Never Used  . Tobacco comment: quit 30 years ago  Substance and Sexual Activity  . Alcohol use: Yes    Alcohol/week: 2.0 standard drinks    Types: 2 Glasses of wine per week    Comment: with lunch  . Drug use: No  . Sexual activity: Yes  Lifestyle  . Physical activity:    Days per week: 2 days    Minutes per session: 10 min  . Stress: Very much  Relationships  . Social connections:    Talks on phone: Once a week    Gets together: Not on file    Attends religious service: Never    Active member of club or organization: No    Attends meetings of clubs or organizations: Never    Relationship status: Married  . Intimate partner violence:    Fear of current or ex partner: No    Emotionally abused: No    Physically abused: No    Forced sexual activity: No  Other Topics Concern  . Not on file  Social History Narrative   Lives at home with his wife and takes care of his  wife. Independent at baseline      - Biggest strain is financial but doesn't think they could got get more help.      Patient expressed interest in possible financial assistance. Informed written consent obtained    Outpatient Medications Prior to Visit  Medication Sig Dispense Refill  . albuterol (VENTOLIN HFA) 108 (90 Base) MCG/ACT inhaler Inhale 2 puffs into the lungs every 4 (four) hours as needed. (Patient taking differently: Inhale 2 puffs into the lungs every 4 (four) hours as needed for wheezing or shortness of breath. ) 72 g 0  . allopurinol (ZYLOPRIM) 300 MG tablet Take 1 tablet (300 mg total) by mouth daily. 90 tablet 3  . chlordiazePOXIDE (LIBRIUM) 25 MG capsule Take 1 tablet by mouth 5 times a day on day 1, then decrease by 1 tablet daily until gone. 15 capsule 0  . colchicine 0.6 MG tablet Take 1 tablet (0.6 mg  total) by mouth daily. 4 tablet 0  . docusate sodium (COLACE) 100 MG capsule Take 1 tablet once or twice daily as needed for constipation while taking narcotic pain medicine 30 capsule 0  . Fluticasone-Salmeterol (ADVAIR DISKUS) 100-50 MCG/DOSE AEPB Inhale 1 puff into the lungs 2 (two) times daily. 244 each 0  . folic acid (FOLVITE) 1 MG tablet Take 1 tablet (1 mg total) by mouth daily. 90 tablet 3  . lisinopril (PRINIVIL,ZESTRIL) 20 MG tablet Take 1 tablet (20 mg total) by mouth daily. 30 tablet 1  . Multiple Vitamins-Minerals (MULTIVITAMIN WITH MINERALS) tablet Take 1 tablet by mouth daily. 90 tablet 3  . predniSONE (DELTASONE) 10 MG tablet Take 1 tablet (10 mg total) by mouth daily. 42 tablet 0  . cephALEXin (KEFLEX) 500 MG capsule Take 1 capsule (500 mg total) by mouth 3 (three) times daily for 4 days. 12 capsule 0  . tiZANidine (ZANAFLEX) 4 MG tablet Take 0.5 tablets (2 mg total) by mouth every 8 (eight) hours as needed for muscle spasms. 30 tablet 0  . traMADol (ULTRAM) 50 MG tablet Take 1 tablet (50 mg total) by mouth every 6 (six) hours as needed. 20 tablet 0   No facility-administered medications prior to visit.     Allergies  Allergen Reactions  . Percocet [Oxycodone-Acetaminophen] Nausea And Vomiting    ROS Review of Systems  Constitutional: Negative.   HENT: Negative.   Eyes: Negative.   Respiratory: Negative.   Cardiovascular: Negative.   Gastrointestinal: Negative.   Skin: Negative.   Neurological: Negative.   Psychiatric/Behavioral: Negative.       Objective:    Physical Exam  No vital sign or PE was done. There were no vitals taken for this visit. Wt Readings from Last 3 Encounters:  03/20/19 185 lb (83.9 kg)  03/18/19 185 lb (83.9 kg)  03/09/19 185 lb 3 oz (84 kg)     Health Maintenance Due  Topic Date Due  . Hepatitis C Screening  10-27-1961  . COLONOSCOPY  10/11/2009    There are no preventive care reminders to display for this patient.  Lab  Results  Component Value Date   TSH 4.525 (H) 03/12/2017   Lab Results  Component Value Date   WBC 8.7 03/20/2019   HGB 10.6 (L) 03/20/2019   HCT 33.6 (L) 03/20/2019   MCV 87.7 03/20/2019   PLT 340 03/20/2019   Lab Results  Component Value Date   NA 141 03/20/2019   K 3.1 (L) 03/20/2019   CO2 23 03/20/2019   GLUCOSE 127 (H)  03/20/2019   BUN 6 03/20/2019   CREATININE 0.90 03/20/2019   BILITOT 0.3 03/20/2019   ALKPHOS 52 03/20/2019   AST 10 (L) 03/20/2019   ALT 10 03/20/2019   PROT 5.2 (L) 03/20/2019   ALBUMIN 3.0 (L) 03/20/2019   CALCIUM 7.5 (L) 03/20/2019   ANIONGAP 11 03/20/2019   Lab Results  Component Value Date   CHOL 144 03/12/2017   Lab Results  Component Value Date   HDL 52 03/12/2017   Lab Results  Component Value Date   LDLCALC 71 03/12/2017   Lab Results  Component Value Date   TRIG 107 03/12/2017   Lab Results  Component Value Date   CHOLHDL 2.8 03/12/2017   Lab Results  Component Value Date   HGBA1C 5.1 03/12/2017      Assessment & Plan:     1. Alcohol use disorder, severe, dependence ([place]) - He will start Naltrexone, he was educated on  Medication side effects and advised to notify clinic. - He will continue to follow up with [facility] mental health [facility]. - naltrexone (DEPADE) 50 MG tablet; Take 0.5 tablets (25 mg total) by mouth daily.  Dispense: 15 tablet; Refill: 0  2. Muscle cramp, nocturnal - He will continue current regimen. - tiZANidine (ZANAFLEX) 4 MG tablet; Take 0.5 tablets (2 mg total) by mouth every 8 (eight) hours as needed for muscle spasms.  Dispense: 30 tablet; Refill: 0  3. Essential hypertension - He will continue current treatment regimen, BP is controlled. -Low salt DASH diet -Take medications regularly on time -Exercise regularly as tolerated -Check blood pressure at least daily at home and record -Goal is less than 140/90 and normal blood pressure is 120/80     Follow-up: Return in about 2  weeks (around 04/05/2019), or if symptoms worsen or fail to improve.    Mandie Crabbe Jerold Coombe, NP

## 2019-03-23 ENCOUNTER — Telehealth: Payer: Self-pay | Admitting: Licensed Clinical Social Worker

## 2019-03-23 NOTE — Telephone Encounter (Signed)
Clinician received a distressing call from the patient's wife. Release of information on file to speak to the wife and verbal consent provided by the patient. She explained to the patient's spouse that if she is feeling unsafe the next time her husband is under the influence of alcohol to call 911. She explained that due to her husband being her primary care giver that she was concerned for her safety and would be contacting Adult Protective Services to file a report.

## 2019-03-23 NOTE — Telephone Encounter (Signed)
Clinician received a voicemail from the patient's spouse On May 6th 2020@ approximately 2:28 pm in distress and asked for help because her husband is not complying with recommendations from either his therapist Nira Conn and [place]. She explained that he was prescribed something to help him quit drinking but did not pick up the medication despite giving him the money to go get it and bought alcohol instead. She described him yelling at her, saying he wants to die, and throwing things at her. She requested that someone from the clinic call as soon as possible.

## 2019-03-23 NOTE — Telephone Encounter (Signed)
Opened by mistake.

## 2019-03-31 IMAGING — CT CT ABD-PELV W/O CM
2 of 4 series · 16 of 46 positions shown, 18 images · non-contrast
Comparison: CT scan of March 02, 2018.

CLINICAL DATA: Acute right-sided abdominal pain.

EXAM:
CT ABDOMEN AND PELVIS WITHOUT CONTRAST
TECHNIQUE: Multidetector CT imaging of the abdomen and pelvis was performed
following the standard protocol without IV contrast.

[Series 2: routine abd/pel wo · axial · 0.77mm/px · z∈[-481,-26]mm · 13 of 99 slices shown, 15 images]
[im 4/99  soft-tissue]
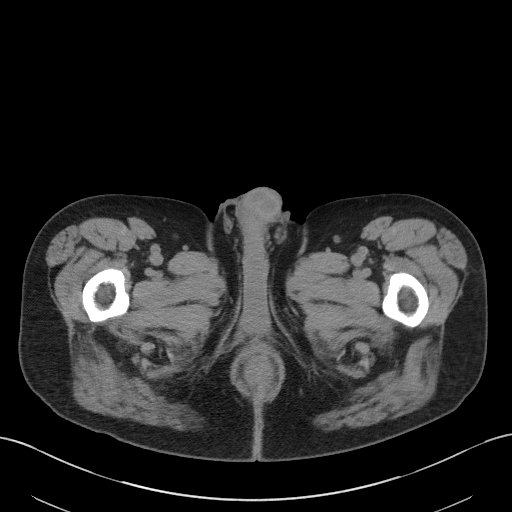
[im 4/99  bone]
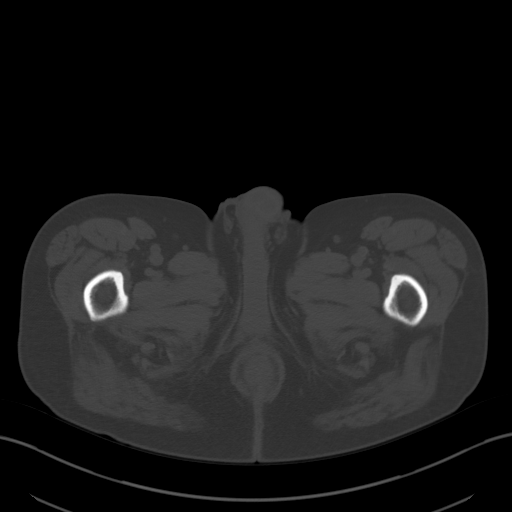
[im 12/99  soft-tissue]
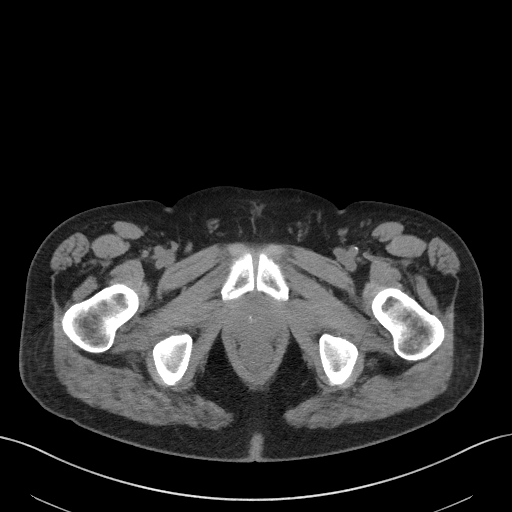
[im 20/99  soft-tissue]
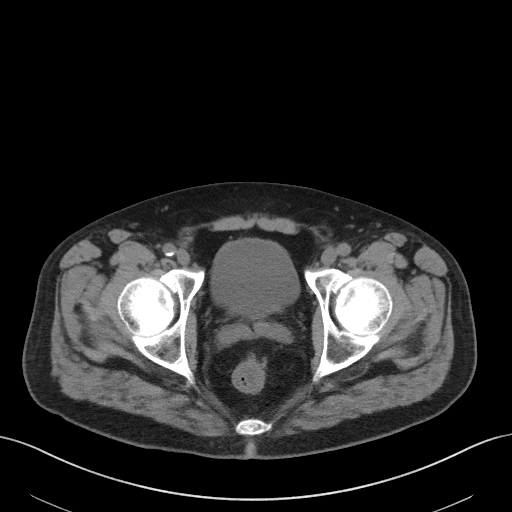
[im 28/99  soft-tissue]
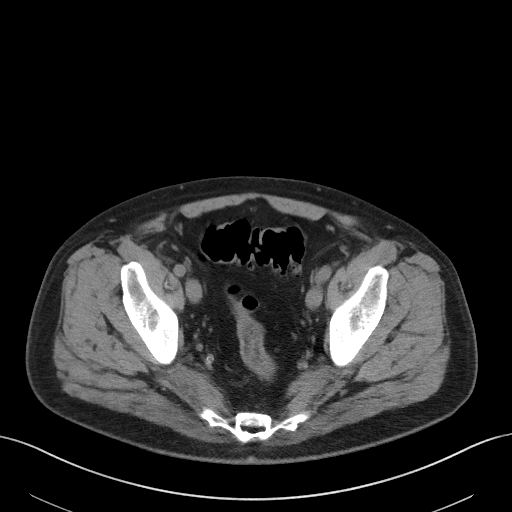
[im 36/99  soft-tissue]
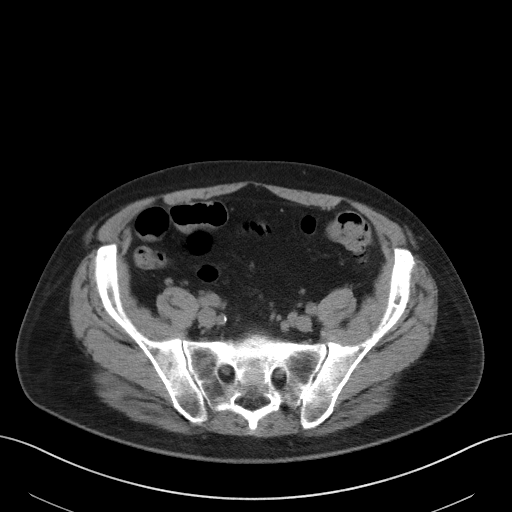
[im 44/99  soft-tissue]
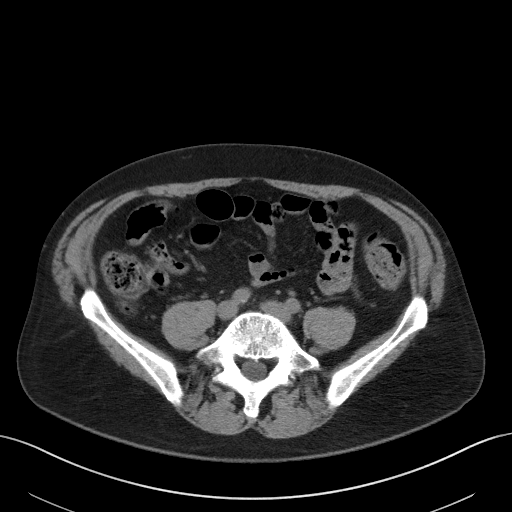
[im 51/99  soft-tissue]
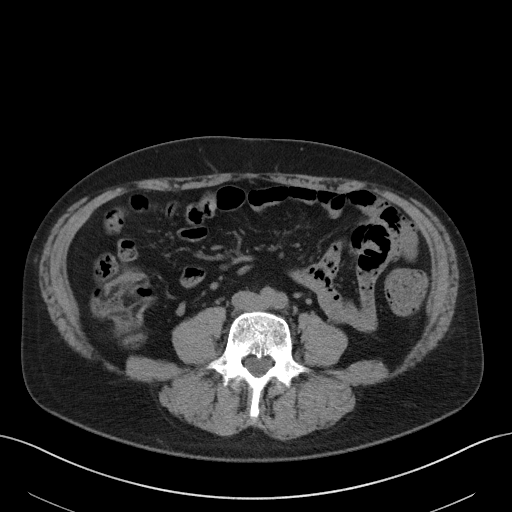
[im 55/99  soft-tissue]
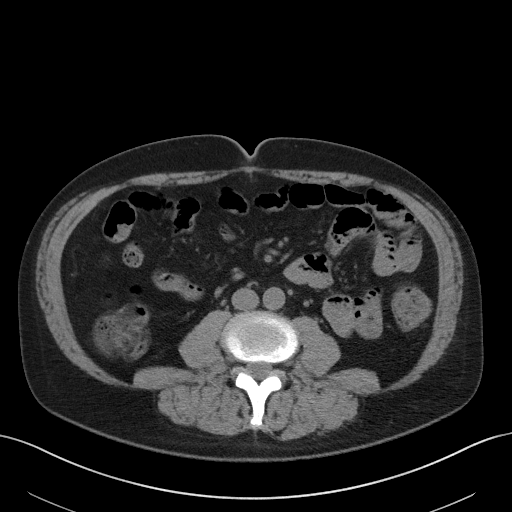
[im 63/99  soft-tissue]
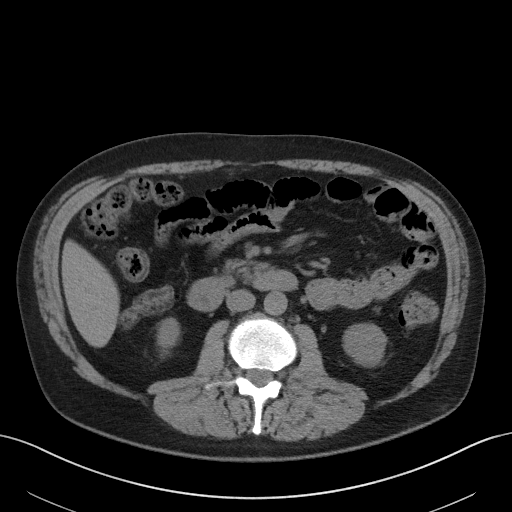
[im 63/99  bone]
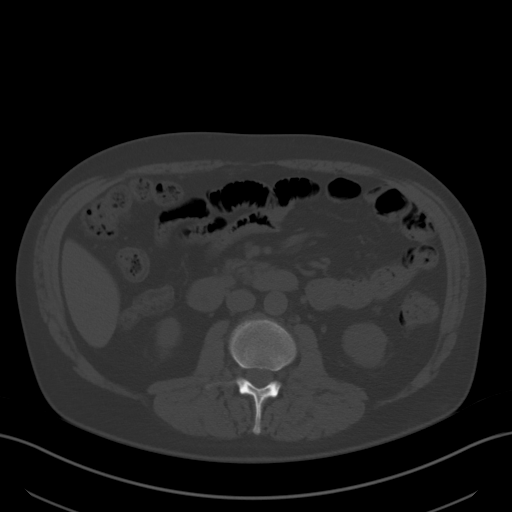
[im 71/99  soft-tissue]
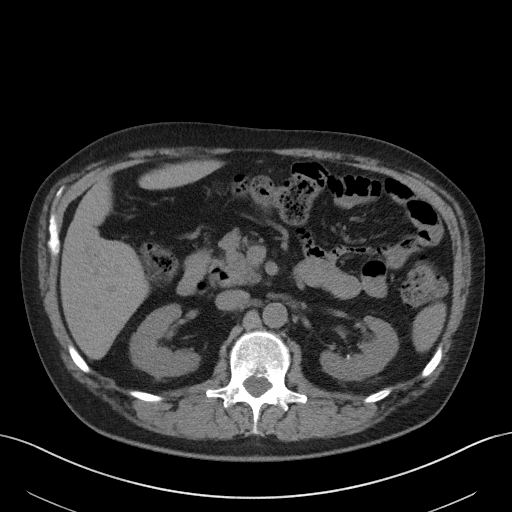
[im 79/99  soft-tissue]
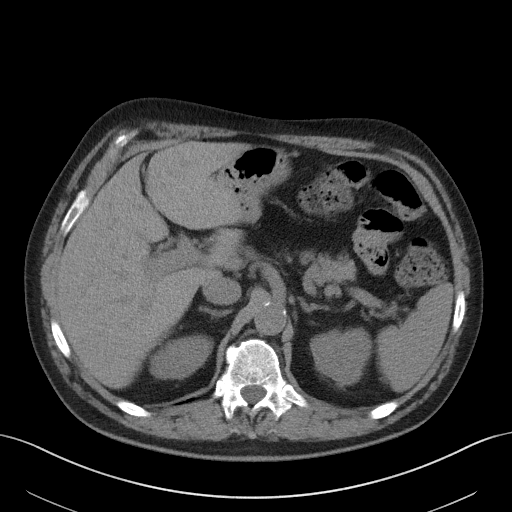
[im 87/99  soft-tissue]
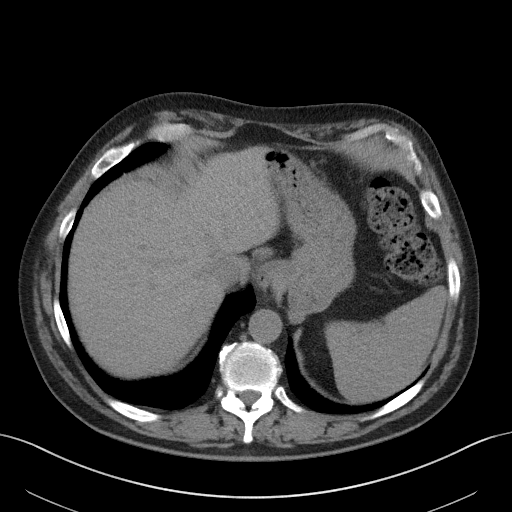
[im 95/99  soft-tissue]
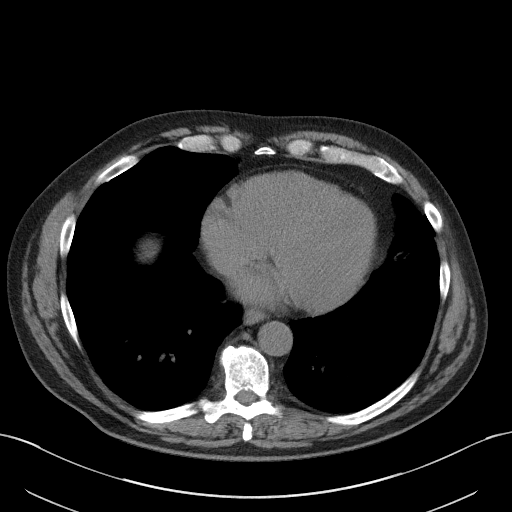

[Series 5: coronal st · coronal · 0.76mm/px · 3 of 87 slices shown]
[im 29/87  soft-tissue]
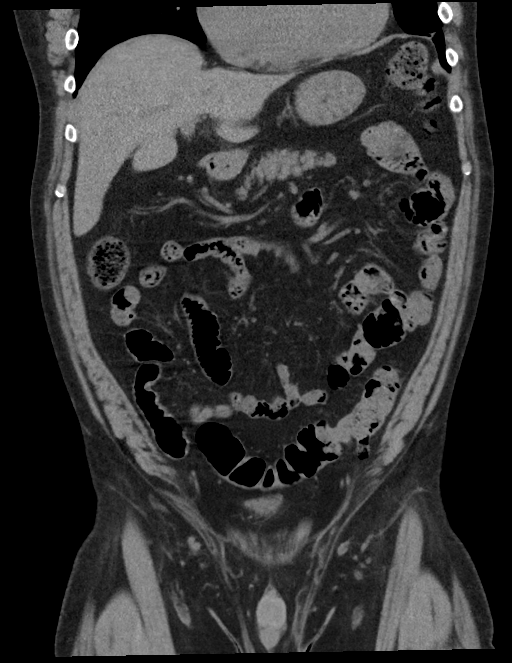
[im 39/87  soft-tissue]
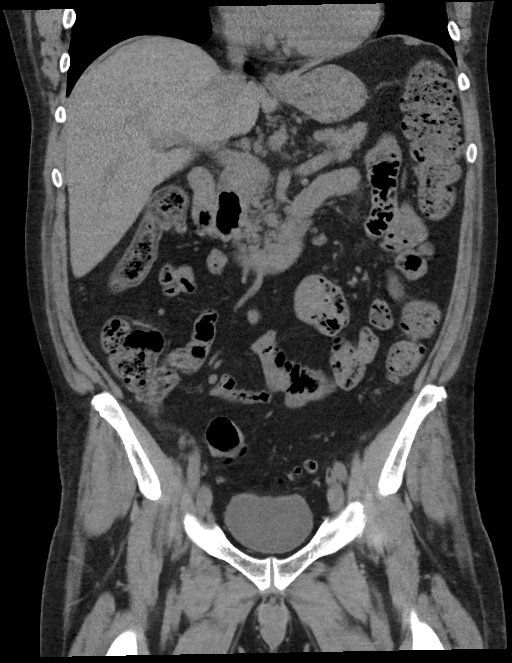
[im 48/87  soft-tissue]
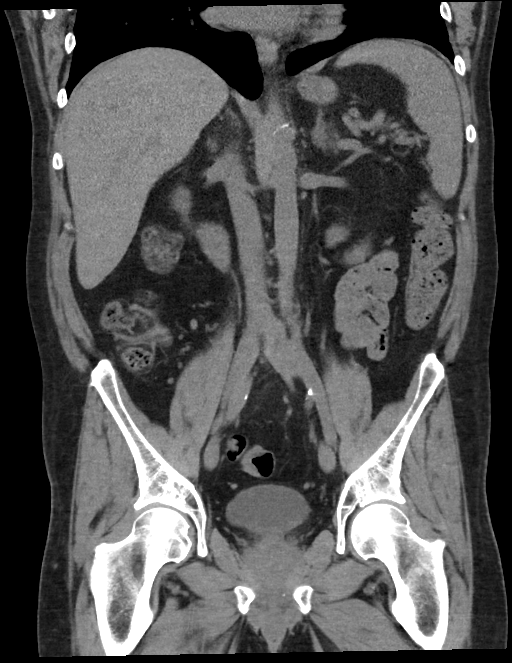

[16 of 46 positions shown; findings below may reference images not displayed]

FINDINGS: Lower chest: No acute abnormality.

Hepatobiliary: No focal liver abnormality is seen. Status post
cholecystectomy. No biliary dilatation.

Pancreas: Unremarkable. No pancreatic ductal dilatation or
surrounding inflammatory changes.

Spleen: Normal in size without focal abnormality.

Adrenals/Urinary Tract: Adrenal glands appear normal. Bilateral
nephrolithiasis is noted. No hydronephrosis or renal obstruction is
noted. No ureteral calculi are noted. Urinary bladder is
unremarkable.

Stomach/Bowel: Stomach is within normal limits. Appendix appears
normal. No evidence of bowel wall thickening, distention, or
inflammatory changes. Sigmoid diverticulosis is noted without
inflammation.

Vascular/Lymphatic: No significant vascular findings are present. No
enlarged abdominal or pelvic lymph nodes.

Reproductive: Stable mild prostatic enlargement is noted.

Other: No abdominal wall hernia or abnormality. No abdominopelvic
ascites.

Musculoskeletal: No acute or significant osseous findings.
IMPRESSION: Bilateral nonobstructive nephrolithiasis. No hydronephrosis or renal
obstruction is noted.

Sigmoid diverticulosis without inflammation.

Stable mild prostatic enlargement.

## 2019-04-04 ENCOUNTER — Other Ambulatory Visit: Payer: Self-pay

## 2019-04-04 ENCOUNTER — Ambulatory Visit: Payer: No Typology Code available for payment source | Admitting: Licensed Clinical Social Worker

## 2019-04-04 DIAGNOSIS — F1020 Alcohol dependence, uncomplicated: Secondary | ICD-10-CM

## 2019-04-04 NOTE — BH Specialist Note (Signed)
Integrated Behavioral Health Follow Up Visit Via Phone  MRN: [id number] Name: Miguel [name]  Number of [place] Clinician visits: 3/6  Type of Service: [place] Interpretor:No. Interpretor Name and Language: Not applicable.   SUBJECTIVE: Miguel [name] is a 58 y.o. male accompanied by himself. Patient was referred by Miguel Shadow NP for substance abuse. Patient reports the following symptoms/concerns: He reports that he is doing a 100 % better and is taking the Naltrexone that was prescribed by the nurse practitioner. He reports that he hasn't had a drink in 12 days. He describes having more energy to do things around the house and is having problems with sitting still. He expressed concern for shaking some, having short term memory loss problems, and only getting about three to four hours of sleep a night. He reports that he has experienced an increase in appetite because he is constantly wanting to snack on things. He denies suicidal and homicidal thoughts. Duration of problem: ; Severity of problem: mild  OBJECTIVE: Mood: Euthymic and Affect: Appropriate Risk of harm to self or others: No plan to harm self or others  LIFE CONTEXT: Family and Social: Miguel [name] reports that he is no longer arguing with his wife and they are spending quality time with one another. School/Work:  Self-Care: see above. Life Changes: see above.  GOALS ADDRESSED: Patient will: 1.  Reduce symptoms of: anxiety  2.  Increase knowledge and/or ability of: healthy habits and stress reduction  3.  Demonstrate ability to: Increase healthy adjustment to current life circumstances  INTERVENTIONS: Interventions utilized:  Behavioral Activation was utilized by the clinician focusing on the patient's burst of energy and relapse prevention. Clinician processed with the patient regarding how he has been doing since the last follow up session. Clinician explained  to the patient that it sounds like the medication is helping to eliminate his cravings and he has a lot more energy. Clinician explained that the increase in appetite could be a combination of medications he is on. Clinician explained that she thinks the shaking could be due to his body trying to get used to not having alcohol in his system. Clinician suggested that the patient come up with a hobby that he does on a regular basis to keep himself busy and distracted.  Standardized Assessments completed: GAD-7 and PHQ 9  ASSESSMENT: Patient currently experiencing see above.   Patient may benefit from see above. Marland Kitchen  PLAN: 1. Follow up with behavioral health clinician on : two weeks or earlier if needed. 2. Behavioral recommendations: see above. 3. Referral(s): [place] (In Clinic) 4. "From scale of 1-10, how likely are you to follow plan?":   Bayard Hugger, LCSW

## 2019-04-05 ENCOUNTER — Encounter: Payer: Self-pay | Admitting: Gerontology

## 2019-04-05 ENCOUNTER — Ambulatory Visit: Payer: Medicaid Other | Admitting: Gerontology

## 2019-04-05 DIAGNOSIS — F1020 Alcohol dependence, uncomplicated: Secondary | ICD-10-CM

## 2019-04-05 DIAGNOSIS — G4700 Insomnia, unspecified: Secondary | ICD-10-CM

## 2019-04-05 DIAGNOSIS — G250 Essential tremor: Secondary | ICD-10-CM

## 2019-04-05 DIAGNOSIS — I10 Essential (primary) hypertension: Secondary | ICD-10-CM

## 2019-04-05 NOTE — Progress Notes (Signed)
Established Patient Office Visit  Subjective:  Patient ID: Miguel [name], male    DOB: 26-Sep-1961  Age: 58 y.o. MRN: [id number]  CC:  Chief Complaint  Patient presents with  . Follow-up   Patient consents to telephone visit and 2 patient identifier was used to identify patient.  HPI Miguel [name] presents for follow up for Alcohol use disorder, hypertension and insomnia. He reports that he is doing good, has so much energy and has engaged in doing household chores. He states that he continues to have mild essential tremor to his hands, but his body is not shaking and he's able to drive comfortably. He has being taking naltrexone 25 mg daily and has not consumed alcoholic beverage in 13 days, denies the desire to drink and withdrawal symptoms. He states that he's having difficulty falling asleep, and it has being going on for more than 4 years. He reports that their television is on in the bedroom through the night and he finally falls asleep around 2:30 am and wakes up between 7 and 8 am. He continues to take 20 mg lisinopril daily, checks his blood pressure at home and it was 145/67 and HR 69 this morning. He states that he's compliant with DASH diet and exercising. He denies cough, light headedness, chest pain, palpitation and no further concerns.   Past Medical History:  Diagnosis Date  . Alcohol abuse   . Asthma   . GERD (gastroesophageal reflux disease)   . Gout   . Hypertension   . Kidney stone   . OCD (obsessive compulsive disorder)   . Renal colic     Past Surgical History:  Procedure Laterality Date  . CHOLECYSTECTOMY  2012  . EXTRACORPOREAL SHOCK WAVE LITHOTRIPSY Left 01/12/2019   Procedure: EXTRACORPOREAL SHOCK WAVE LITHOTRIPSY (ESWL);  Surgeon: Billey Co, MD;  Location: [facility] ORS;  Service: Urology;  Laterality: Left;    Family History  Problem Relation Age of Onset  . Alcohol abuse Father   . Breast cancer Mother 81    Social History   Socioeconomic  History  . Marital status: Married    Spouse name: Not on file  . Number of children: Not on file  . Years of education: Not on file  . Highest education level: Not on file  Occupational History  . Not on file  Social Needs  . Financial resource strain: Very hard  . Food insecurity:    Worry: Never true    Inability: Never true  . Transportation needs:    Medical: No    Non-medical: No  Tobacco Use  . Smoking status: Former Research scientist (life sciences)  . Smokeless tobacco: Never Used  . Tobacco comment: quit 30 years ago  Substance and Sexual Activity  . Alcohol use: Yes    Alcohol/week: 2.0 standard drinks    Types: 2 Glasses of wine per week    Comment: with lunch  . Drug use: No  . Sexual activity: Yes  Lifestyle  . Physical activity:    Days per week: 2 days    Minutes per session: 10 min  . Stress: Very much  Relationships  . Social connections:    Talks on phone: Once a week    Gets together: Not on file    Attends religious service: Never    Active member of club or organization: No    Attends meetings of clubs or organizations: Never    Relationship status: Married  . Intimate partner violence:  Fear of current or ex partner: No    Emotionally abused: No    Physically abused: No    Forced sexual activity: No  Other Topics Concern  . Not on file  Social History Narrative   Lives at home with his wife and takes care of his wife. Independent at baseline      - Biggest strain is financial but doesn't think they could got get more help.      Patient expressed interest in possible financial assistance. Informed written consent obtained    Outpatient Medications Prior to Visit  Medication Sig Dispense Refill  . albuterol (VENTOLIN HFA) 108 (90 Base) MCG/ACT inhaler Inhale 2 puffs into the lungs every 4 (four) hours as needed. (Patient taking differently: Inhale 2 puffs into the lungs every 4 (four) hours as needed for wheezing or shortness of breath. ) 72 g 0  . allopurinol  (ZYLOPRIM) 300 MG tablet Take 1 tablet (300 mg total) by mouth daily. 90 tablet 3  . chlordiazePOXIDE (LIBRIUM) 25 MG capsule Take 1 tablet by mouth 5 times a day on day 1, then decrease by 1 tablet daily until gone. 15 capsule 0  . colchicine 0.6 MG tablet Take 1 tablet (0.6 mg total) by mouth daily. 4 tablet 0  . docusate sodium (COLACE) 100 MG capsule Take 1 tablet once or twice daily as needed for constipation while taking narcotic pain medicine 30 capsule 0  . Fluticasone-Salmeterol (ADVAIR DISKUS) 100-50 MCG/DOSE AEPB Inhale 1 puff into the lungs 2 (two) times daily. 062 each 0  . folic acid (FOLVITE) 1 MG tablet Take 1 tablet (1 mg total) by mouth daily. 90 tablet 3  . lisinopril (PRINIVIL,ZESTRIL) 20 MG tablet Take 1 tablet (20 mg total) by mouth daily. 30 tablet 1  . Multiple Vitamins-Minerals (MULTIVITAMIN WITH MINERALS) tablet Take 1 tablet by mouth daily. 90 tablet 3  . naltrexone (DEPADE) 50 MG tablet Take 0.5 tablets (25 mg total) by mouth daily. 15 tablet 0  . predniSONE (DELTASONE) 10 MG tablet Take 1 tablet (10 mg total) by mouth daily. 42 tablet 0  . tiZANidine (ZANAFLEX) 4 MG tablet Take 0.5 tablets (2 mg total) by mouth every 8 (eight) hours as needed for muscle spasms. 30 tablet 0   No facility-administered medications prior to visit.     Allergies  Allergen Reactions  . Percocet [Oxycodone-Acetaminophen] Nausea And Vomiting    ROS Review of Systems  Constitutional: Negative.   HENT: Negative.   Respiratory: Negative.   Cardiovascular: Negative.   Gastrointestinal: Negative.   Genitourinary: Negative.   Musculoskeletal: Negative.   Skin: Negative.   Neurological: Positive for tremors (essential hand tremors).  Psychiatric/Behavioral: Positive for sleep disturbance (insomia).      Objective:    Physical Exam No vital sign and PE done There were no vitals taken for this visit. Wt Readings from Last 3 Encounters:  03/20/19 185 lb (83.9 kg)  03/18/19 185 lb  (83.9 kg)  03/09/19 185 lb 3 oz (84 kg)     Health Maintenance Due  Topic Date Due  . Hepatitis C Screening  1961-08-07  . COLONOSCOPY  10/11/2009    There are no preventive care reminders to display for this patient.  Lab Results  Component Value Date   TSH 4.525 (H) 03/12/2017   Lab Results  Component Value Date   WBC 8.7 03/20/2019   HGB 10.6 (L) 03/20/2019   HCT 33.6 (L) 03/20/2019   MCV 87.7 03/20/2019   PLT  340 03/20/2019   Lab Results  Component Value Date   NA 141 03/20/2019   K 3.1 (L) 03/20/2019   CO2 23 03/20/2019   GLUCOSE 127 (H) 03/20/2019   BUN 6 03/20/2019   CREATININE 0.90 03/20/2019   BILITOT 0.3 03/20/2019   ALKPHOS 52 03/20/2019   AST 10 (L) 03/20/2019   ALT 10 03/20/2019   PROT 5.2 (L) 03/20/2019   ALBUMIN 3.0 (L) 03/20/2019   CALCIUM 7.5 (L) 03/20/2019   ANIONGAP 11 03/20/2019   Lab Results  Component Value Date   CHOL 144 03/12/2017   Lab Results  Component Value Date   HDL 52 03/12/2017   Lab Results  Component Value Date   LDLCALC 71 03/12/2017   Lab Results  Component Value Date   TRIG 107 03/12/2017   Lab Results  Component Value Date   CHOLHDL 2.8 03/12/2017   Lab Results  Component Value Date   HGBA1C 5.1 03/12/2017      Assessment & Plan:     1. Alcohol use disorder, severe, dependence ([place]) - He will continue on current treatment regimen. - He will continue to follow up with [facility] for counseling and will provide information for Alcohol Anonymous.  2. Essential hypertension - He will continue on current treatment regimen -Low salt DASH diet -Exercise regularly as tolerated -Check blood pressure at least once a day at home, record and bring log to next appointment. -Goal is less than 140/90 and normal blood pressure is 120/80   3. Insomnia, unspecified type DDX Alcohol induced tremors. -Keep a sleep diary to help you and your health care provider figure out what could be causing your insomnia.  Write down: ? When you sleep. ? When you wake up during the night. ? How well you sleep. ? How rested you feel the next day. ? Any side effects of medicines you are taking. ? What you eat and drink. -Make your bedroom a dark, comfortable place where it is easy to fall asleep. ? Put up shades or blackout curtains to block light from outside. ? Use a white noise machine to block noise. ? Keep the temperature cool.  Limit screen use before bedtime. This includes: ? Watching TV. ? Using your smartphone, tablet, or computer.  Stick to a routine that includes going to bed and waking up at the same times every day and night. This can help you fall asleep faster. Consider making a quiet activity, such as reading, part of your nighttime routine.  Try to avoid taking naps during the day so that you sleep better at night.  Get out of bed if you are still awake after 15 minutes of trying to sleep. Keep the lights down, but try reading or doing a quiet activity. When you feel sleepy, go back to bed. 4. Essential Tremor DDX possible alcohol induced tremor. - He was advised to notify clinic or go to the ED for worsening tremor.  Follow-up: Return in about 3 weeks (around 04/26/2019), or if symptoms worsen or fail to improve.    Avarey Yaeger Jerold Coombe, NP

## 2019-04-05 NOTE — Patient Instructions (Signed)

## 2019-04-12 ENCOUNTER — Other Ambulatory Visit: Payer: Self-pay

## 2019-04-12 DIAGNOSIS — F1020 Alcohol dependence, uncomplicated: Secondary | ICD-10-CM

## 2019-04-12 MED ORDER — NALTREXONE HCL 50 MG PO TABS: 50.0000 mg | ORAL_TABLET | Freq: Every day | ORAL | 0 refills | Status: DC

## 2019-04-18 ENCOUNTER — Ambulatory Visit: Payer: No Typology Code available for payment source | Admitting: Licensed Clinical Social Worker

## 2019-04-18 ENCOUNTER — Other Ambulatory Visit: Payer: Self-pay

## 2019-04-18 DIAGNOSIS — F1020 Alcohol dependence, uncomplicated: Secondary | ICD-10-CM

## 2019-04-18 NOTE — BH Specialist Note (Signed)
Integrated Behavioral Health Follow Up Visit Via Phone  MRN: [id number] Name: LEANDER TOUT  Number of [facility] Clinician visits: 4/6  Type of Service: [place] Interpretor:No. Interpretor Name and Language: Not applicable.  SUBJECTIVE: WINDELL MUSSON is a 58 y.o. male accompanied by himself. Patient was referred by Carlyon Shadow, NP. Patient reports the following symptoms/concerns: He reports that he hasn't had a drink of alcohol since starting the Naltrexone since around the beginning of May. He notes that he sometimes feels weird on the medication. He explains that he is getting six hours a sleep a night on average. He continue to complain about shakiness in his hands. He reports that since he stopped the prednisone that he is less hyper and is no longer feeling constantly hungry. He notes that he is trying to take things one day at a time and find things to stay occupied. He notes that he hasn't had any arguments with his wife and is enjoying spending time with her. He denies suicidal and homicidal thoughts.  Duration of problem: ; Severity of problem: mild  OBJECTIVE: Mood: Euthymic and Affect: Appropriate Risk of harm to self or others: No plan to harm self or others  LIFE CONTEXT: Family and Social: see above. School/Work: see above. Self-Care: see above. Life Changes: see above.   GOALS ADDRESSED: Patient will: 1.  Reduce symptoms of: relapse prevention  2.  Increase knowledge and/or ability of: self-management skills and stress reduction  3.  Demonstrate ability to: Increase healthy adjustment to current life circumstances  INTERVENTIONS: Interventions utilized:  Motivational Interviewing was utilized by the clinician focusing on relapse prevention. Clinician processed with the patient regarding how he has been doing since the last follow up session. Clinician asked the patient how long its been since he had a drink of  alcohol. Clinician explained to the patient that she is glad to hear that he is longer feeling hyper and having the inability to sit still. Clinician explained to the patient that as long as he stays on the Naltrexone that it will continue to block his cravings for alcohol. Clinician explained to the patient its a good idea to not fix what isn't already broken in terms of taking the Naltrexone.  Standardized Assessments completed: GAD-7 and PHQ 9  ASSESSMENT: Patient currently experiencing see above.   Patient may benefit from see above.  PLAN: 1. Follow up with behavioral health clinician on : three weeks or earlier if needed. 2. Behavioral recommendations: see above. 3. Referral(s): [facility] (In Clinic) 4. "From scale of 1-10, how likely are you to follow plan?":   Bayard Hugger, LCSW

## 2019-04-19 ENCOUNTER — Telehealth: Payer: Self-pay | Admitting: Pharmacist

## 2019-04-19 NOTE — Telephone Encounter (Signed)
04/19/2019 12:10:29 PM - [place] renewal-Advair & Ventolin  04/19/2019 Time for patient to renew with [place] for Ventolin & Advair [id number] [employer] application-mailing patient his portion of application to sign & return also need current 2020 household income/support, taxes/4506T, also mailing patient Recertification packet to complete & return to [name]. Printed scripts for Ventolin HFA 42mcg Inhale 2 puffs every four hours as needed & Advair 100/50 Inhale 1 puff two times a day, rinse mouth and spit after each use.Delos Haring

## 2019-04-21 ENCOUNTER — Emergency Department
Admission: EM | Admit: 2019-04-21 | Discharge: 2019-04-21 | Disposition: A | Discharge status: Home / Self Care | Payer: Medicaid Other | Attending: Emergency Medicine | Admitting: Emergency Medicine

## 2019-04-21 ENCOUNTER — Other Ambulatory Visit: Payer: Self-pay

## 2019-04-21 DIAGNOSIS — I10 Essential (primary) hypertension: Secondary | ICD-10-CM | POA: Insufficient documentation

## 2019-04-21 DIAGNOSIS — Z87891 Personal history of nicotine dependence: Secondary | ICD-10-CM | POA: Insufficient documentation

## 2019-04-21 DIAGNOSIS — F10920 Alcohol use, unspecified with intoxication, uncomplicated: Secondary | ICD-10-CM | POA: Insufficient documentation

## 2019-04-21 DIAGNOSIS — F331 Major depressive disorder, recurrent, moderate: Secondary | ICD-10-CM | POA: Insufficient documentation

## 2019-04-21 DIAGNOSIS — Z79899 Other long term (current) drug therapy: Secondary | ICD-10-CM | POA: Insufficient documentation

## 2019-04-21 DIAGNOSIS — Y908 Blood alcohol level of 240 mg/100 ml or more: Secondary | ICD-10-CM | POA: Insufficient documentation

## 2019-04-21 DIAGNOSIS — R4182 Altered mental status, unspecified: Secondary | ICD-10-CM | POA: Insufficient documentation

## 2019-04-21 DIAGNOSIS — F10220 Alcohol dependence with intoxication, uncomplicated: Secondary | ICD-10-CM | POA: Diagnosis present

## 2019-04-21 DIAGNOSIS — J45909 Unspecified asthma, uncomplicated: Secondary | ICD-10-CM | POA: Insufficient documentation

## 2019-04-21 DIAGNOSIS — F1020 Alcohol dependence, uncomplicated: Secondary | ICD-10-CM | POA: Diagnosis present

## 2019-04-21 LAB — CBC WITH DIFFERENTIAL/PLATELET
Abs Immature Granulocytes: 0.01 10*3/uL (ref 0.00–0.07)
Basophils Absolute: 0.1 10*3/uL (ref 0.0–0.1)
Basophils Relative: 1 %
Eosinophils Absolute: 0.4 10*3/uL (ref 0.0–0.5)
Eosinophils Relative: 5 %
HCT: 41.7 % (ref 39.0–52.0)
Hemoglobin: 13.3 g/dL (ref 13.0–17.0)
Immature Granulocytes: 0 %
Lymphocytes Relative: 30 %
Lymphs Abs: 2.2 10*3/uL (ref 0.7–4.0)
MCH: 26.5 pg (ref 26.0–34.0)
MCHC: 31.9 g/dL (ref 30.0–36.0)
MCV: 83.2 fL (ref 80.0–100.0)
Monocytes Absolute: 0.4 10*3/uL (ref 0.1–1.0)
Monocytes Relative: 6 %
Neutro Abs: 4.1 10*3/uL (ref 1.7–7.7)
Neutrophils Relative %: 58 %
Platelets: 289 10*3/uL (ref 150–400)
RBC: 5.01 MIL/uL (ref 4.22–5.81)
RDW: 17.2 % — ABNORMAL HIGH (ref 11.5–15.5)
WBC: 7.1 10*3/uL (ref 4.0–10.5)
nRBC: 0 % (ref 0.0–0.2)

## 2019-04-21 LAB — COMPREHENSIVE METABOLIC PANEL
ALT: 14 U/L (ref 0–44)
AST: 14 U/L — ABNORMAL LOW (ref 15–41)
Albumin: 4.3 g/dL (ref 3.5–5.0)
Alkaline Phosphatase: 67 U/L (ref 38–126)
Anion gap: 13 (ref 5–15)
BUN: 19 mg/dL (ref 6–20)
CO2: 21 mmol/L — ABNORMAL LOW (ref 22–32)
Calcium: 9 mg/dL (ref 8.9–10.3)
Chloride: 111 mmol/L (ref 98–111)
Creatinine, Ser: 0.93 mg/dL (ref 0.61–1.24)
GFR calc Af Amer: >60 mL/min (ref >60)
GFR calc non Af Amer: >60 mL/min (ref >60)
Glucose, Bld: 104 mg/dL — ABNORMAL HIGH (ref 70–99)
Potassium: 4.2 mmol/L (ref 3.5–5.1)
Sodium: 145 mmol/L (ref 135–145)
Total Bilirubin: 0.2 mg/dL — ABNORMAL LOW (ref 0.3–1.2)
Total Protein: 7.2 g/dL (ref 6.5–8.1)

## 2019-04-21 LAB — URINE DRUG SCREEN, QUALITATIVE (ARMC ONLY)
Amphetamines, Ur Screen: NOT DETECTED
Barbiturates, Ur Screen: NOT DETECTED
Benzodiazepine, Ur Scrn: NOT DETECTED
Cannabinoid 50 Ng, Ur ~~LOC~~: NOT DETECTED
Cocaine Metabolite,Ur ~~LOC~~: NOT DETECTED
MDMA (Ecstasy)Ur Screen: NOT DETECTED
Methadone Scn, Ur: NOT DETECTED
Opiate, Ur Screen: NOT DETECTED
Phencyclidine (PCP) Ur S: NOT DETECTED
Tricyclic, Ur Screen: NOT DETECTED

## 2019-04-21 LAB — SALICYLATE LEVEL: Salicylate Lvl: <7 mg/dL (ref 2.8–30.0)

## 2019-04-21 LAB — ETHANOL: Alcohol, Ethyl (B): 391 mg/dL (ref <10)

## 2019-04-21 LAB — ACETAMINOPHEN LEVEL: Acetaminophen (Tylenol), Serum: <10 ug/mL — ABNORMAL LOW (ref 10–30)

## 2019-04-21 MED ORDER — THIAMINE HCL 100 MG/ML IJ SOLN: 100.0000 mg | Freq: Every day | INTRAMUSCULAR | Status: DC

## 2019-04-21 MED ORDER — ALBUTEROL SULFATE HFA 108 (90 BASE) MCG/ACT IN AERS
1.0000 | INHALATION_SPRAY | Freq: Four times a day (QID) | RESPIRATORY_TRACT | Status: DC | PRN
  Administered 2019-04-21: 2 via RESPIRATORY_TRACT
  Filled 2019-04-21: qty 6.7

## 2019-04-21 MED ORDER — LORAZEPAM 2 MG PO TABS
2.0000 mg | ORAL_TABLET | Freq: Once | ORAL | Status: AC
  Administered 2019-04-21: 2 mg via ORAL
  Filled 2019-04-21: qty 1

## 2019-04-21 MED ORDER — LORAZEPAM 1 MG PO TABS
1.0000 mg | ORAL_TABLET | Freq: Once | ORAL | Status: AC
  Administered 2019-04-21: 1 mg via ORAL
  Filled 2019-04-21: qty 1

## 2019-04-21 MED ORDER — THIAMINE HCL 100 MG/ML IJ SOLN
Freq: Once | INTRAVENOUS | Status: AC
  Administered 2019-04-21: 04:00:00 via INTRAVENOUS
  Filled 2019-04-21: qty 1000

## 2019-04-21 MED ORDER — GABAPENTIN 100 MG PO CAPS
200.0000 mg | ORAL_CAPSULE | Freq: Three times a day (TID) | ORAL | Status: DC
  Filled 2019-04-21 (×3): qty 2

## 2019-04-21 MED ORDER — GABAPENTIN 100 MG PO CAPS: 200.0000 mg | ORAL_CAPSULE | Freq: Three times a day (TID) | ORAL | 2 refills | Status: DC

## 2019-04-21 MED ORDER — LORAZEPAM 2 MG/ML IJ SOLN
0.0000 mg | Freq: Four times a day (QID) | INTRAMUSCULAR | Status: DC
  Administered 2019-04-21: 1 mg via INTRAVENOUS
  Filled 2019-04-21: qty 1

## 2019-04-21 MED ORDER — SODIUM CHLORIDE 0.9 % IV BOLUS
1000.0000 mL | Freq: Once | INTRAVENOUS | Status: AC
  Administered 2019-04-21: 1000 mL via INTRAVENOUS

## 2019-04-21 NOTE — BH Assessment (Signed)
Assessment Note  Miguel [name] is an 58 y.o. male via EMS from home. Pt states he drank half a bottle of vodka tonight after being sober for 1 month. Pt arrives with BPD under IVC. Per PD pt was found in car with nail file threatening to "stab himself in heart" with file. Pt reports that he doesn't remember what happened that caused him to come to the ED but he is certain that his wife is he person who called authorities.  During the assessment the pt was extremely intoxicated with a BAL of 391. Pt was very talkative and viably had hand tremors. Pt was tearful at times when speaking about his wife. 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 Pt denies SI/HI A/V H/D   Diagnosis: Altered mental status  Past Medical History:  Past Medical History:  Diagnosis Date  . Alcohol abuse   . Asthma   . GERD (gastroesophageal reflux disease)   . Gout   . Hypertension   . Kidney stone   . OCD (obsessive compulsive disorder)   . Renal colic     Past Surgical History:  Procedure Laterality Date  . CHOLECYSTECTOMY  2012  . EXTRACORPOREAL SHOCK WAVE LITHOTRIPSY Left 01/12/2019   Procedure: EXTRACORPOREAL SHOCK WAVE LITHOTRIPSY (ESWL);  Surgeon: Billey Co, MD;  Location: [facility] ORS;  Service: Urology;  Laterality: Left;    Family History:  Family History  Problem Relation Age of Onset  . Alcohol abuse Father   . Breast cancer Mother 55    Social History:  reports that he has quit smoking. He has never used smokeless tobacco. He reports current alcohol use of about 2.0 standard drinks of alcohol per week. He reports that he does not use drugs.  Additional Social History:  Alcohol / Drug Use Pain Medications: SEE MAR Prescriptions: SEE MAR Over the Counter: SEE MAR History of alcohol / drug use?: Yes Substance #1 Name of Substance 1: etoh  CIWA: CIWA-Ar BP: (!) 162/99 Pulse Rate: 94 COWS:    Allergies:  Allergies  Allergen Reactions  . Percocet [Oxycodone-Acetaminophen] Nausea And Vomiting    Home  Medications: (Not in a hospital admission)   OB/GYN Status:  No LMP for male patient.  General Assessment Data Location of Assessment: [facility] ED TTS Assessment: In system Is this a Tele or Face-to-Face Assessment?: Face-to-Face Is this an Initial Assessment or a Re-assessment for this encounter?: Re-Assessment Patient Accompanied by:: N/A Language Other than English: No Living Arrangements: Other (Comment) What gender do you identify as?: Male Marital status: Married Living Arrangements: Spouse/significant other Can pt return to current living arrangement?: Yes Admission Status: Involuntary Petitioner: ED Attending Is patient capable of signing voluntary admission?: No Referral Source: Self/Family/Friend Insurance type: none  Medical Screening Exam [facility] ONLY) Medical Exam completed: Yes  Crisis Care Plan Living Arrangements: Spouse/significant other Legal Guardian: Other:(self) Name of Psychiatrist: n/a Name of Therapist: n/a  Education Status Is patient currently in school?: No Is the patient employed, unemployed or receiving disability?: Unemployed  Risk to self with the past 6 months Suicidal Ideation: No Has patient been a risk to self within the past 6 months prior to admission? : No Suicidal Intent: No Has patient had any suicidal intent within the past 6 months prior to admission? : No Is patient at risk for suicide?: No Suicidal Plan?: No Has patient had any suicidal plan within the past 6 months prior to admission? : No Access to Means: No What has been your use of drugs/alcohol  within the last 12 months?: daily drinker Previous Attempts/Gestures: No How many times?: 0 Other Self Harm Risks: n/a Triggers for Past Attempts: None known Intentional Self Injurious Behavior: None Family Suicide History: No Recent stressful life event(s): Turmoil (Comment) Persecutory voices/beliefs?: No Depression Symptoms: Feeling angry/irritable, Loss of interest in  usual pleasures, Feeling worthless/self pity, Tearfulness, Insomnia Substance abuse history and/or treatment for substance abuse?: Yes Suicide prevention information given to non-admitted patients: Not applicable  Risk to Others within the past 6 months Homicidal Ideation: No Does patient have any lifetime risk of violence toward others beyond the six months prior to admission? : No Thoughts of Harm to Others: No Current Homicidal Intent: No Current Homicidal Plan: No Access to Homicidal Means: No History of harm to others?: No Assessment of Violence: None Noted Violent Behavior Description: none Does patient have access to weapons?: No Criminal Charges Pending?: No Does patient have a court date: No Is patient on probation?: No  Psychosis Hallucinations: None noted Delusions: None noted  Mental Status Report Appearance/Hygiene: In scrubs Eye Contact: Good Motor Activity: Freedom of movement Speech: Tangential, Slurred Level of Consciousness: Alert(intoxicated) Mood: Depressed Affect: Appropriate to circumstance Anxiety Level: None Judgement: Impaired Orientation: Person Obsessive Compulsive Thoughts/Behaviors: Minimal  Cognitive Functioning Concentration: Decreased Memory: Recent Impaired, Remote Impaired Is patient IDD: No Insight: Poor Impulse Control: Poor Appetite: Poor Have you had any weight changes? : No Change Sleep: Decreased Total Hours of Sleep: 6  ADLScreening [facility] Assessment Services) Patient's cognitive ability adequate to safely complete daily activities?: Yes Patient able to express need for assistance with ADLs?: Yes Independently performs ADLs?: Yes (appropriate for developmental age)  Prior Inpatient Therapy Prior Inpatient Therapy: No  Prior Outpatient Therapy Prior Outpatient Therapy: No Does patient have an ACCT team?: No Does patient have Intensive In-House Services?  : No Does patient have [facility] services? : No Does patient have  P4CC services?: No  ADL Screening (condition at time of admission) Patient's cognitive ability adequate to safely complete daily activities?: Yes Is the patient deaf or have difficulty hearing?: No Does the patient have difficulty seeing, even when wearing glasses/contacts?: No Does the patient have difficulty concentrating, remembering, or making decisions?: No Patient able to express need for assistance with ADLs?: Yes Does the patient have difficulty dressing or bathing?: No Independently performs ADLs?: Yes (appropriate for developmental age) Does the patient have difficulty walking or climbing stairs?: No Weakness of Legs: None Weakness of Arms/Hands: None  Home Assistive Devices/Equipment Home Assistive Devices/Equipment: None  Therapy Consults (therapy consults require a physician order) PT Evaluation Needed: No OT Evalulation Needed: No SLP Evaluation Needed: No Abuse/Neglect Assessment (Assessment to be complete while patient is alone) Physical Abuse: Denies Verbal Abuse: Denies Sexual Abuse: Denies Exploitation of patient/patient's resources: Denies Self-Neglect: Denies Values / Beliefs Cultural Requests During Hospitalization: None Spiritual Requests During Hospitalization: None Consults Spiritual Care Consult Needed: No Social Work Consult Needed: No Regulatory affairs officer (For Healthcare) Does Patient Have a Medical Advance Directive?: No Would patient like information on creating a medical advance directive?: No - Patient declined          Disposition:  Disposition Initial Assessment Completed for this Encounter: Yes  On Site Evaluation by:   Reviewed with Physician:    Danyia Borunda D Tivis Wherry 04/21/2019 6:12 AM

## 2019-04-21 NOTE — ED Notes (Signed)
TTS monitor in place for pt evaluation. Pt alert and cooperative. No distress noted.

## 2019-04-21 NOTE — ED Notes (Signed)
Pt up to restroom with steady gait. Pt refusing to collect urine sample for lab.

## 2019-04-21 NOTE — ED Triage Notes (Signed)
Pt to ED via EMS from home. Pt states he drank half a bottle of vodka tonight after being sober for 1 month. Pt arrives with BPD under IVC. Per PD pt was found in car with nail file threatening to "stab himself in heart" with file. Pt states he denies SI/HI. Pt denies pain.

## 2019-04-21 NOTE — ED Notes (Signed)
Pt resting quietly on stretcher with iv fluids infusing. Pt states he is feeling a little better. Tremors seems to have lessened. Provided for safety and comfort and will continue to assess.

## 2019-04-21 NOTE — ED Notes (Signed)
Pt keeps taking mask off. Pt encouraged to wear mask. Pt grabs this RN's hand as I was assisting the mask adjustment and rips the mask off and states very loudly "I Tamel't want to wear it."

## 2019-04-21 NOTE — ED Notes (Signed)
Pt calling out asking for an inhaler. Pt states he uses one every day and feels like he needs it. MD notified and order received. Pharmacy called and aware.

## 2019-04-21 NOTE — ED Notes (Signed)
Pt given a new mask per request.

## 2019-04-21 NOTE — ED Provider Notes (Addendum)
[facility] Emergency Department Provider Note   ____________________________________________   First MD Initiated Contact with Patient 04/21/19 [phone]     (approximate)  I have reviewed the triage vital signs and the nursing notes.   HISTORY  Chief Complaint Alcohol Intoxication and Suicidal    HPI Miguel [name] is a 58 y.o. male brought to the ED via EMS from home under IVC for suicidal ideation.  Patient is well-known to the emergency department.  States he was sober for 1 month but got an argument with his wife tonight so he drank half a bottle of vodka.  He was found in his car with a nail file threatening to stab himself in the heart.  Denies active HI/AH/VH.  Voices no medical complaints.        Past Medical History:  Diagnosis Date  . Alcohol abuse   . Asthma   . GERD (gastroesophageal reflux disease)   . Gout   . Hypertension   . Kidney stone   . OCD (obsessive compulsive disorder)   . Renal colic     Patient Active Problem List   Diagnosis Date Noted  . Alcoholic intoxication with complication ([place])   . Sepsis ([place]) 03/08/2019  . Breast lump or mass 10/04/2018  . Sepsis secondary to UTI ([place]) 09/14/2018  . Asthma 07/07/2018  . GERD (gastroesophageal reflux disease) 07/07/2018  . OCD (obsessive compulsive disorder) 07/07/2018  . Self-inflicted laceration of wrist 03/10/2018  . Leg hematoma 12/25/2017  . Suicide and self-inflicted injury by cutting and piercing instrument ([place]) 03/10/2017  . Severe recurrent major 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 ([place]) 03/09/2017  . Substance induced mood disorder ([place]) 08/15/2016  . Involuntary commitment 08/15/2016  . Alcohol use disorder, severe, dependence ([place]) 02/05/2016  . Hypertension 12/05/2015  . Tachycardia 12/05/2015  . Gout 11/13/2015  . Chronic back pain 05/02/2015    Past Surgical History:  Procedure Laterality Date  . CHOLECYSTECTOMY  2012  . EXTRACORPOREAL SHOCK WAVE  LITHOTRIPSY Left 01/12/2019   Procedure: EXTRACORPOREAL SHOCK WAVE LITHOTRIPSY (ESWL);  Surgeon: Billey Co, MD;  Location: [facility] ORS;  Service: Urology;  Laterality: Left;    Prior to Admission medications   Medication Sig Start Date End Date Taking? Authorizing Provider  albuterol (VENTOLIN HFA) 108 (90 Base) MCG/ACT inhaler Inhale 2 puffs into the lungs every 4 (four) hours as needed. Patient taking differently: Inhale 2 puffs into the lungs every 4 (four) hours as needed for wheezing or shortness of breath.  09/01/18   Tukov-Yual, Arlyss Gandy, NP  allopurinol (ZYLOPRIM) 300 MG tablet Take 1 tablet (300 mg total) by mouth daily. 09/01/18   Tukov-Yual, Arlyss Gandy, NP  chlordiazePOXIDE (LIBRIUM) 25 MG capsule Take 1 tablet by mouth 5 times a day on day 1, then decrease by 1 tablet daily until gone. 03/01/19   Earleen Newport, MD  colchicine 0.6 MG tablet Take 1 tablet (0.6 mg total) by mouth daily. 03/09/19   Lang Snow, NP  docusate sodium (COLACE) 100 MG capsule Take 1 tablet once or twice daily as needed for constipation while taking narcotic pain medicine 01/09/19   Hinda Kehr, MD  Fluticasone-Salmeterol (ADVAIR DISKUS) 100-50 MCG/DOSE AEPB Inhale 1 puff into the lungs 2 (two) times daily. 09/01/18   Tukov-Yual, Arlyss Gandy, NP  folic acid (FOLVITE) 1 MG tablet Take 1 tablet (1 mg total) by mouth daily. 09/01/18   Tukov-Yual, Arlyss Gandy, NP  lisinopril (PRINIVIL,ZESTRIL) 20 MG tablet Take 1 tablet (20 mg  total) by mouth daily. 02/21/19   Iloabachie, Chioma E, NP  Multiple Vitamins-Minerals (MULTIVITAMIN WITH MINERALS) tablet Take 1 tablet by mouth daily. 09/01/18   Tukov-Yual, Arlyss Gandy, NP  naltrexone (DEPADE) 50 MG tablet Take 1 tablet (50 mg total) by mouth daily for 30 days. 04/12/19 05/12/19  Iloabachie, Chioma E, NP  predniSONE (DELTASONE) 10 MG tablet Take 1 tablet (10 mg total) by mouth daily. 03/20/19   Cuthriell, Charline Bills, PA-C  tiZANidine (ZANAFLEX) 4 MG tablet  Take 0.5 tablets (2 mg total) by mouth every 8 (eight) hours as needed for muscle spasms. 03/22/19   Iloabachie, Chioma E, NP    Allergies Percocet [oxycodone-acetaminophen]  Family History  Problem Relation Age of Onset  . Alcohol abuse Father   . Breast cancer Mother 23    Social History Social History   Tobacco Use  . Smoking status: Former Research scientist (life sciences)  . Smokeless tobacco: Never Used  . Tobacco comment: quit 30 years ago  Substance Use Topics  . Alcohol use: Yes    Alcohol/week: 2.0 standard drinks    Types: 2 Glasses of wine per week    Comment: with lunch  . Drug use: No    Review of Systems  Constitutional: No fever/chills Eyes: No visual changes. ENT: No sore throat. Cardiovascular: Denies chest pain. Respiratory: Denies shortness of breath. Gastrointestinal: No abdominal pain.  No nausea, no vomiting.  No diarrhea.  No constipation. Genitourinary: Negative for dysuria. Musculoskeletal: Negative for back pain. Skin: Negative for rash. Neurological: Negative for headaches, focal weakness or numbness. Psychiatric: Positive for SI.  ____________________________________________   PHYSICAL EXAM:  VITAL SIGNS: ED Triage Vitals  Enc Vitals Group     BP 04/21/19 0028 (!) 162/99     Pulse Rate 04/21/19 0028 94     Resp 04/21/19 0033 16     Temp 04/21/19 0028 98.5 F (36.9 C)     Temp Source 04/21/19 0028 Oral     SpO2 04/21/19 0028 98 %     Weight 04/21/19 0028 200 lb (90.7 kg)     Height 04/21/19 0028 6' (1.829 m)     Head Circumference --      Peak Flow --      Pain Score 04/21/19 0027 0     Pain Loc --      Pain Edu? --      Excl. in [place]? --     Constitutional: Alert and oriented.  Disheveled appearing and in mild acute distress. Eyes: Conjunctivae are normal. PERRL. EOMI. Head: Atraumatic. Nose: No congestion/rhinnorhea. Mouth/Throat: Mucous membranes are moist.  Oropharynx non-erythematous. Neck: No stridor.   Cardiovascular: Normal rate, regular  rhythm. Grossly normal heart sounds.  Good peripheral circulation. Respiratory: Normal respiratory effort.  No retractions. Lungs CTAB. Gastrointestinal: Soft and nontender. No distention. No abdominal bruits. No CVA tenderness. Musculoskeletal: No lower extremity tenderness nor edema.  No joint effusions. Neurologic:  Normal speech and language. No gross focal neurologic deficits are appreciated.  Skin:  Skin is warm, dry and intact. No rash noted. Psychiatric: Intoxicated.  Mood and affect are argumentative. Speech and behavior are normal.  ____________________________________________   LABS (all labs ordered are listed, but only abnormal results are displayed)  Labs Reviewed  CBC WITH DIFFERENTIAL/PLATELET - Abnormal; Notable for the following components:      Result Value   RDW 17.2 (*)    All other components within normal limits  COMPREHENSIVE METABOLIC PANEL - Abnormal; Notable for the following components:  CO2 21 (*)    Glucose, Bld 104 (*)    AST 14 (*)    Total Bilirubin 0.2 (*)    All other components within normal limits  ETHANOL - Abnormal; Notable for the following components:   Alcohol, Ethyl (B) 391 (*)    All other components within normal limits  ACETAMINOPHEN LEVEL - Abnormal; Notable for the following components:   Acetaminophen (Tylenol), Serum <10 (*)    All other components within normal limits  SALICYLATE LEVEL  URINE DRUG SCREEN, QUALITATIVE ([facility] ONLY)   ____________________________________________  EKG  ED ECG REPORT I, Jospeh Mangel J, the attending physician, personally viewed and interpreted this ECG.   Date: 04/21/2019  EKG Time: 0628  Rate: 101  Rhythm: normal EKG, normal sinus rhythm  Axis: Normal  Intervals:none  ST&T Change: Nonspecific  ____________________________________________  RADIOLOGY  ED MD interpretation: None  Official radiology report(s): No results found.  ____________________________________________    PROCEDURES  Procedure(s) performed (including Critical Care):  Procedures   ____________________________________________   INITIAL IMPRESSION / ASSESSMENT AND PLAN / ED COURSE  As part of my medical decision making, I reviewed the following data within the [facility] notes reviewed and incorporated, Labs reviewed, Old chart reviewed, A consult was requested and obtained from this/these consultant(s) Psychiatry and Notes from prior ED visits     KSEAN VALE was evaluated in Emergency Department on 04/21/2019 for the symptoms described in the history of present illness. He was evaluated in the context of the global COVID-19 pandemic, which necessitated consideration that the patient might be at risk for infection with the SARS-CoV-2 virus that causes COVID-19. Institutional protocols and algorithms that pertain to the evaluation of patients at risk for COVID-19 are in a state of rapid change based on information released by regulatory bodies including the CDC and federal and state organizations. These policies and algorithms were followed during the patient's care in the ED.   58 year old male brought for IVC.  Heavily intoxicated.  Will maintain IVC, place on CIWA scale, initiate IV fluid resuscitation.  Disposition per psychiatry.   Clinical Course as of Apr 21 611  Fri Apr 21, 2019  0611 Patient sleeping.  Awaiting psychiatric evaluation and disposition.   [JS]    Clinical Course User Index [JS] Paulette Blanch, MD     ____________________________________________   FINAL CLINICAL IMPRESSION(S) / ED DIAGNOSES  Final diagnoses:  Alcoholic intoxication without complication ([place])  Moderate episode of recurrent major depressive disorder [facility])     ED Discharge Orders    None       Note:  This document was prepared using Dragon voice recognition software and may include unintentional dictation errors.   Paulette Blanch, MD 04/21/19 3662    Paulette Blanch, MD 04/21/19 [phone]

## 2019-04-21 NOTE — ED Notes (Addendum)
Pt discharged home (patient called for a cab). BP and pulse elevated. EDP made aware. Cleared for discharge. All belongings returned to patent (including cell phone).  Pt denies SI/HI.

## 2019-04-21 NOTE — ED Notes (Signed)
Patient in room on side of bed asking to go home.

## 2019-04-26 ENCOUNTER — Other Ambulatory Visit: Payer: Self-pay

## 2019-04-26 ENCOUNTER — Ambulatory Visit: Payer: Medicaid Other | Admitting: Gerontology

## 2019-04-26 ENCOUNTER — Encounter: Payer: Self-pay | Admitting: Gerontology

## 2019-04-26 DIAGNOSIS — I10 Essential (primary) hypertension: Secondary | ICD-10-CM

## 2019-04-26 DIAGNOSIS — F1020 Alcohol dependence, uncomplicated: Secondary | ICD-10-CM

## 2019-04-26 DIAGNOSIS — R252 Cramp and spasm: Secondary | ICD-10-CM

## 2019-04-26 MED ORDER — TIZANIDINE HCL 4 MG PO TABS: 2.0000 mg | ORAL_TABLET | Freq: Three times a day (TID) | ORAL | 1 refills | Status: DC | PRN

## 2019-04-26 NOTE — Progress Notes (Signed)
Established Patient Office Visit  Subjective:  Patient ID: Miguel [name], male    DOB: [birth year]  Age: 58 y.o. MRN: [id number]  CC:  Chief Complaint  Patient presents with  . Follow-up    alcohol disorder, high blood pressure  Patient consents to telephone visit and two patient identifiers was used to identify patient.  HPI Miguel [name] presents for follow up post ED visit for Alcohol intoxication and hypertension. He was seen at the ED on 04/21/19 for Alcohol intoxication and suicide ideation. He states that he argued with his wife and he drank 1/2 bottle of vodka and was in the car wanting to stab himself with a nail file. He reports that he continues to take 50 mg Naltrexone and denies any alcohol consumption since 04/22/19. He states that his mood is good and denies suicidal or homicidal ideation. He denies withdrawal symptoms and continues to experience mild peripheral tremors. He states that he continues to experience occasional nocturnal muscle cramps to legs and tizanidine offers relief. He continues to take 20 mg lisinopril daily and checks blood pressure at home. He states that it was 124/62, HR 92 yesterday, he denies chest pain, palpitation, dizziness and cough. He states that he's making life style changes and denies further concerns.  Past Medical History:  Diagnosis Date  . Alcohol abuse   . Asthma   . GERD (gastroesophageal reflux disease)   . Gout   . Hypertension   . Kidney stone   . OCD (obsessive compulsive disorder)   . Renal colic     Past Surgical History:  Procedure Laterality Date  . CHOLECYSTECTOMY  2012  . EXTRACORPOREAL SHOCK WAVE LITHOTRIPSY Left 01/12/2019   Procedure: EXTRACORPOREAL SHOCK WAVE LITHOTRIPSY (ESWL);  Surgeon: Billey Co, MD;  Location: [facility] ORS;  Service: Urology;  Laterality: Left;    Family History  Problem Relation Age of Onset  . Alcohol abuse Father   . Breast cancer Mother 53    Social History   Socioeconomic History  .  Marital status: Married    Spouse name: Not on file  . Number of children: Not on file  . Years of education: Not on file  . Highest education level: Not on file  Occupational History  . Not on file  Social Needs  . Financial resource strain: Very hard  . Food insecurity:    Worry: Never true    Inability: Never true  . Transportation needs:    Medical: No    Non-medical: No  Tobacco Use  . Smoking status: Former Research scientist (life sciences)  . Smokeless tobacco: Never Used  . Tobacco comment: quit 30 years ago  Substance and Sexual Activity  . Alcohol use: Yes    Alcohol/week: 2.0 standard drinks    Types: 2 Glasses of wine per week    Comment: with lunch  . Drug use: No  . Sexual activity: Yes  Lifestyle  . Physical activity:    Days per week: 2 days    Minutes per session: 10 min  . Stress: Very much  Relationships  . Social connections:    Talks on phone: Once a week    Gets together: Not on file    Attends religious service: Never    Active member of club or organization: No    Attends meetings of clubs or organizations: Never    Relationship status: Married  . Intimate partner violence:    Fear of current or ex partner: No  Emotionally abused: No    Physically abused: No    Forced sexual activity: No  Other Topics Concern  . Not on file  Social History Narrative   Lives at home with his wife and takes care of his wife. Independent at baseline      - Biggest strain is financial but doesn't think they could got get more help.      Patient expressed interest in possible financial assistance. Informed written consent obtained    Outpatient Medications Prior to Visit  Medication Sig Dispense Refill  . albuterol (VENTOLIN HFA) 108 (90 Base) MCG/ACT inhaler Inhale 2 puffs into the lungs every 4 (four) hours as needed. (Patient taking differently: Inhale 2 puffs into the lungs every 4 (four) hours as needed for wheezing or shortness of breath. ) 72 g 0  . allopurinol (ZYLOPRIM) 300  MG tablet Take 1 tablet (300 mg total) by mouth daily. 90 tablet 3  . colchicine 0.6 MG tablet Take 1 tablet (0.6 mg total) by mouth daily. 4 tablet 0  . docusate sodium (COLACE) 100 MG capsule Take 1 tablet once or twice daily as needed for constipation while taking narcotic pain medicine 30 capsule 0  . Fluticasone-Salmeterol (ADVAIR DISKUS) 100-50 MCG/DOSE AEPB Inhale 1 puff into the lungs 2 (two) times daily. 354 each 0  . folic acid (FOLVITE) 1 MG tablet Take 1 tablet (1 mg total) by mouth daily. 90 tablet 3  . gabapentin (NEURONTIN) 100 MG capsule Take 2 capsules (200 mg total) by mouth 3 (three) times daily. 90 capsule 2  . lisinopril (PRINIVIL,ZESTRIL) 20 MG tablet Take 1 tablet (20 mg total) by mouth daily. 30 tablet 1  . Multiple Vitamins-Minerals (MULTIVITAMIN WITH MINERALS) tablet Take 1 tablet by mouth daily. 90 tablet 3  . naltrexone (DEPADE) 50 MG tablet Take 1 tablet (50 mg total) by mouth daily for 30 days. 30 tablet 0  . tiZANidine (ZANAFLEX) 4 MG tablet Take 0.5 tablets (2 mg total) by mouth every 8 (eight) hours as needed for muscle spasms. 30 tablet 0  . chlordiazePOXIDE (LIBRIUM) 25 MG capsule Take 1 tablet by mouth 5 times a day on day 1, then decrease by 1 tablet daily until gone. 15 capsule 0  . predniSONE (DELTASONE) 10 MG tablet Take 1 tablet (10 mg total) by mouth daily. 42 tablet 0   No facility-administered medications prior to visit.     Allergies  Allergen Reactions  . Percocet [Oxycodone-Acetaminophen] Nausea And Vomiting    ROS Review of Systems  Constitutional: Negative.   Respiratory: Negative.   Cardiovascular: Negative.   Gastrointestinal: Negative.   Musculoskeletal: Positive for myalgias (intermittent muscle spasms to legs).  Skin: Negative.   Neurological: Positive for tremors (mild peripheral tremor).  Psychiatric/Behavioral: Negative.       Objective:    Physical Exam No vital sign or PE was done There were no vitals taken for this  visit. Wt Readings from Last 3 Encounters:  04/21/19 200 lb (90.7 kg)  03/20/19 185 lb (83.9 kg)  03/18/19 185 lb (83.9 kg)     Health Maintenance Due  Topic Date Due  . Hepatitis C Screening  [birth year]  . COLONOSCOPY  10/11/2009    There are no preventive care reminders to display for this patient.  Lab Results  Component Value Date   TSH 4.525 (H) 03/12/2017   Lab Results  Component Value Date   WBC 7.1 04/21/2019   HGB 13.3 04/21/2019   HCT 41.7 04/21/2019  MCV 83.2 04/21/2019   PLT 289 04/21/2019   Lab Results  Component Value Date   NA 145 04/21/2019   K 4.2 04/21/2019   CO2 21 (L) 04/21/2019   GLUCOSE 104 (H) 04/21/2019   BUN 19 04/21/2019   CREATININE 0.93 04/21/2019   BILITOT 0.2 (L) 04/21/2019   ALKPHOS 67 04/21/2019   AST 14 (L) 04/21/2019   ALT 14 04/21/2019   PROT 7.2 04/21/2019   ALBUMIN 4.3 04/21/2019   CALCIUM 9.0 04/21/2019   ANIONGAP 13 04/21/2019   Lab Results  Component Value Date   CHOL 144 03/12/2017   Lab Results  Component Value Date   HDL 52 03/12/2017   Lab Results  Component Value Date   LDLCALC 71 03/12/2017   Lab Results  Component Value Date   TRIG 107 03/12/2017   Lab Results  Component Value Date   CHOLHDL 2.8 03/12/2017   Lab Results  Component Value Date   HGBA1C 5.1 03/12/2017      Assessment & Plan:     1. Alcohol use disorder, severe, dependence ([place]) - He will continue on Naltrexone and was advised to go to the ED with worsening withdrawal symptoms.  2. Essential hypertension - He will continue on current treatment regimen. -Low salt DASH diet -Exercise regularly as tolerated -Check blood pressure daily,record and bring log to appointment -Goal is less than 140/90 and normal blood pressure is 120/80    3. Muscle cramp, nocturnal - He will continue on current treatment regimen, and notify clinic or go to the ED for worsening symptoms. - tiZANidine (ZANAFLEX) 4 MG tablet; Take 0.5 tablets (2  mg total) by mouth every 8 (eight) hours as needed for muscle spasms.  Dispense: 30 tablet; Refill: 1   Follow-up: Return in about 5 weeks (around 05/31/2019), or if symptoms worsen or fail to improve.    Ozro Russett Jerold Coombe, NP

## 2019-05-04 ENCOUNTER — Other Ambulatory Visit: Payer: Self-pay

## 2019-05-04 ENCOUNTER — Emergency Department
Admission: EM | Admit: 2019-05-04 | Discharge: 2019-05-04 | Disposition: A | Discharge status: Home / Self Care | Payer: Self-pay | Attending: Emergency Medicine | Admitting: Emergency Medicine

## 2019-05-04 DIAGNOSIS — R109 Unspecified abdominal pain: Secondary | ICD-10-CM | POA: Insufficient documentation

## 2019-05-04 DIAGNOSIS — Z5321 Procedure and treatment not carried out due to patient leaving prior to being seen by health care provider: Secondary | ICD-10-CM | POA: Insufficient documentation

## 2019-05-04 DIAGNOSIS — Y939 Activity, unspecified: Secondary | ICD-10-CM | POA: Insufficient documentation

## 2019-05-04 DIAGNOSIS — W19XXXA Unspecified fall, initial encounter: Secondary | ICD-10-CM | POA: Insufficient documentation

## 2019-05-04 DIAGNOSIS — Y929 Unspecified place or not applicable: Secondary | ICD-10-CM | POA: Insufficient documentation

## 2019-05-04 DIAGNOSIS — R0602 Shortness of breath: Secondary | ICD-10-CM | POA: Insufficient documentation

## 2019-05-04 DIAGNOSIS — Y998 Other external cause status: Secondary | ICD-10-CM | POA: Insufficient documentation

## 2019-05-04 LAB — CBC
HCT: 39.9 % (ref 39.0–52.0)
Hemoglobin: 12.4 g/dL — ABNORMAL LOW (ref 13.0–17.0)
MCH: 26.4 pg (ref 26.0–34.0)
MCHC: 31.1 g/dL (ref 30.0–36.0)
MCV: 85.1 fL (ref 80.0–100.0)
Platelets: 244 10*3/uL (ref 150–400)
RBC: 4.69 MIL/uL (ref 4.22–5.81)
RDW: 17.7 % — ABNORMAL HIGH (ref 11.5–15.5)
WBC: 5.9 10*3/uL (ref 4.0–10.5)
nRBC: 0 % (ref 0.0–0.2)

## 2019-05-04 LAB — COMPREHENSIVE METABOLIC PANEL
ALT: 13 U/L (ref 0–44)
AST: 14 U/L — ABNORMAL LOW (ref 15–41)
Albumin: 4.3 g/dL (ref 3.5–5.0)
Alkaline Phosphatase: 67 U/L (ref 38–126)
Anion gap: 8 (ref 5–15)
BUN: 23 mg/dL — ABNORMAL HIGH (ref 6–20)
CO2: 24 mmol/L (ref 22–32)
Calcium: 8.9 mg/dL (ref 8.9–10.3)
Chloride: 110 mmol/L (ref 98–111)
Creatinine, Ser: 1.15 mg/dL (ref 0.61–1.24)
GFR calc Af Amer: >60 mL/min (ref >60)
GFR calc non Af Amer: >60 mL/min (ref >60)
Glucose, Bld: 146 mg/dL — ABNORMAL HIGH (ref 70–99)
Potassium: 3.8 mmol/L (ref 3.5–5.1)
Sodium: 142 mmol/L (ref 135–145)
Total Bilirubin: 0.3 mg/dL (ref 0.3–1.2)
Total Protein: 7 g/dL (ref 6.5–8.1)

## 2019-05-04 LAB — PROTIME-INR
INR: 0.9 (ref 0.8–1.2)
Prothrombin Time: 12.5 seconds (ref 11.4–15.2)

## 2019-05-04 LAB — LIPASE, BLOOD: Lipase: 51 U/L (ref 11–51)

## 2019-05-04 IMAGING — CT CT RENAL STONE PROTOCOL
2 of 4 series · 16 of 46 positions shown, 18 images · non-contrast
Comparison: 06/30/2018

CLINICAL DATA: Left-sided abdominal pain radiating to the left
flank and with rectal bleeding x3 days. History of renal stones.

EXAM:
CT ABDOMEN AND PELVIS WITHOUT CONTRAST
TECHNIQUE: Multidetector CT imaging of the abdomen and pelvis was performed
following the standard protocol without IV contrast.

[Series 2: stone full standard · axial · 0.79mm/px · z∈[-1104,-664]mm · 13 of 97 slices shown, 15 images]
[im 5/97  soft-tissue]
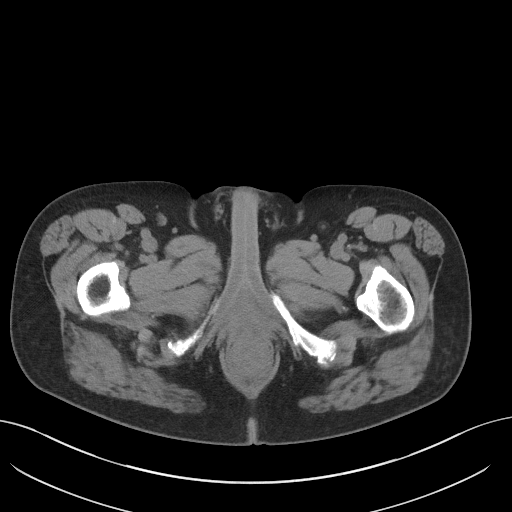
[im 5/97  bone]
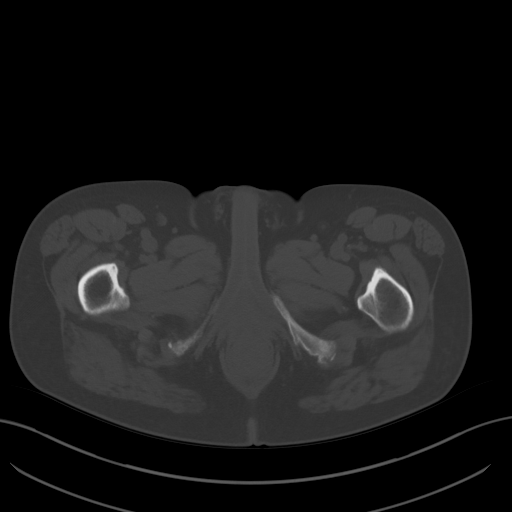
[im 13/97  soft-tissue]
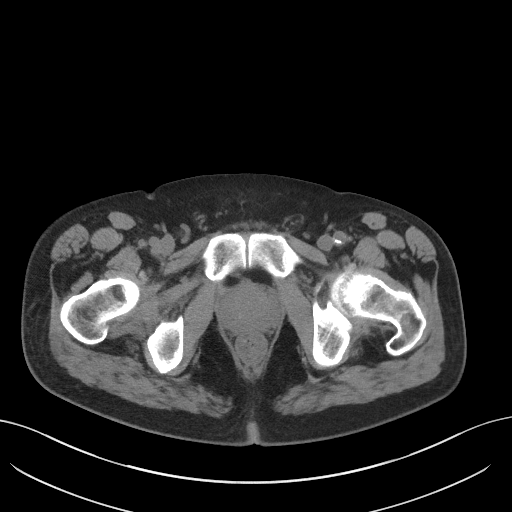
[im 21/97  soft-tissue]
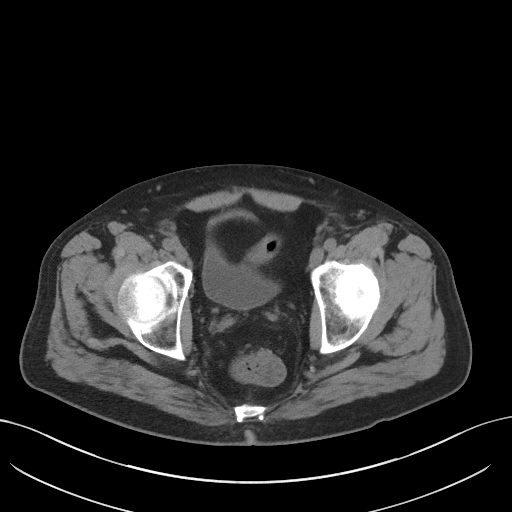
[im 29/97  soft-tissue]
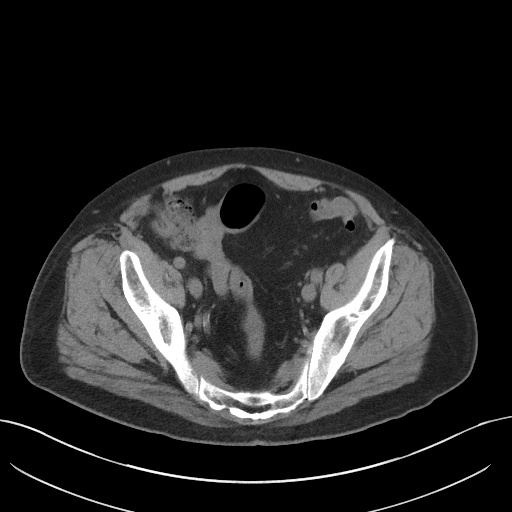
[im 33/97  soft-tissue]
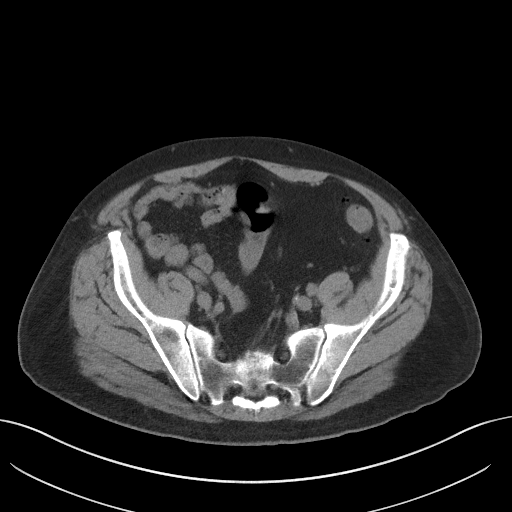
[im 41/97  soft-tissue]
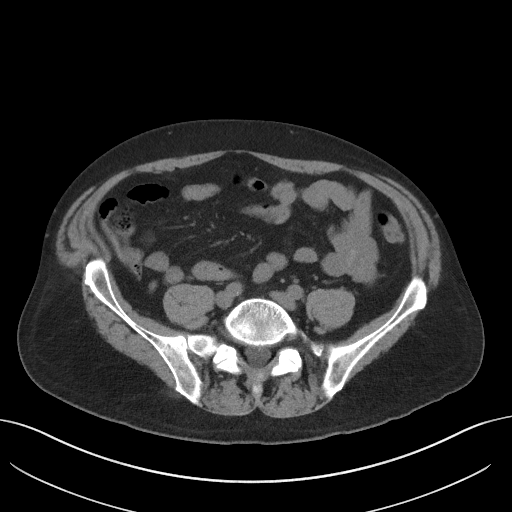
[im 49/97  soft-tissue]
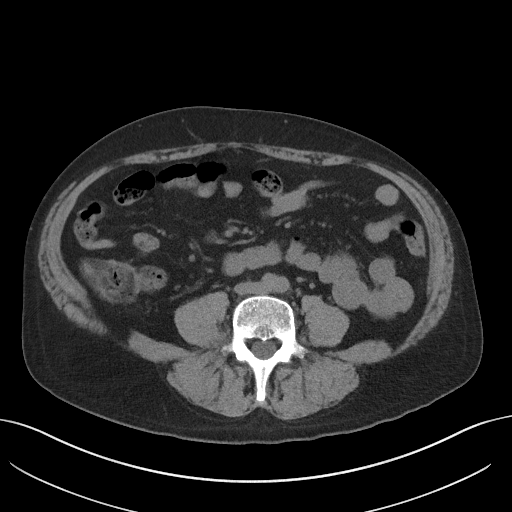
[im 57/97  soft-tissue]
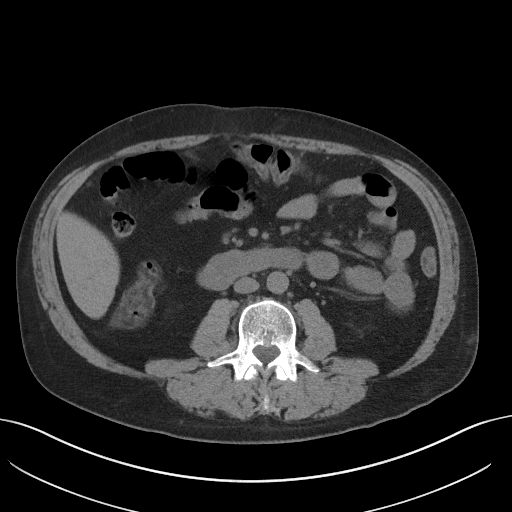
[im 65/97  soft-tissue]
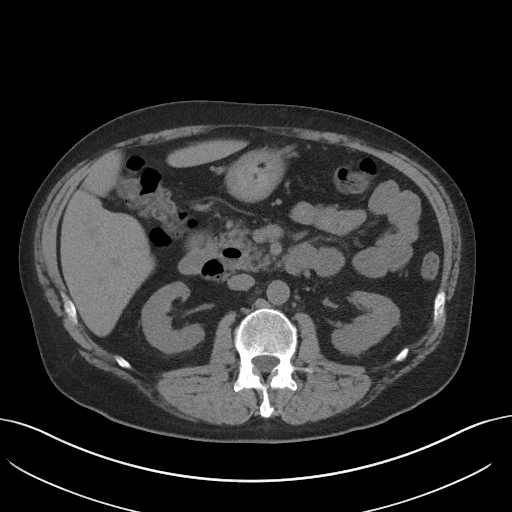
[im 65/97  bone]
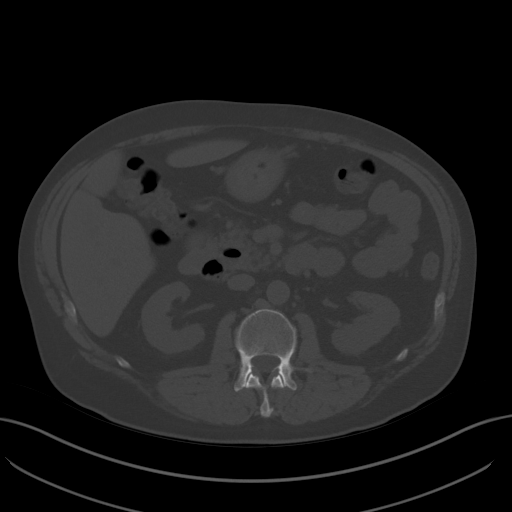
[im 69/97  soft-tissue]
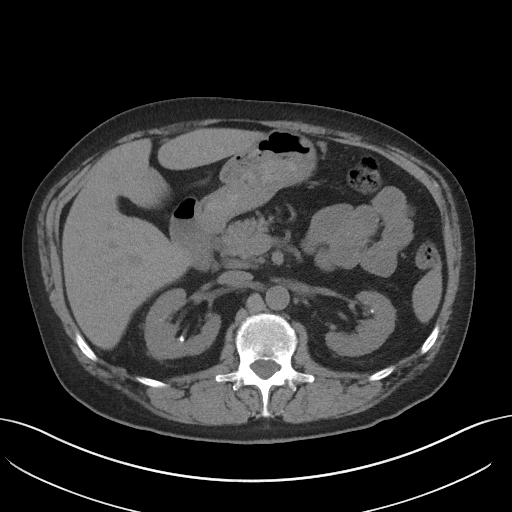
[im 77/97  soft-tissue]
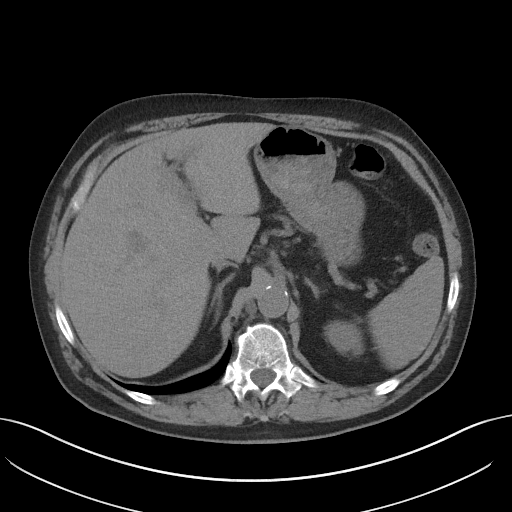
[im 85/97  soft-tissue]
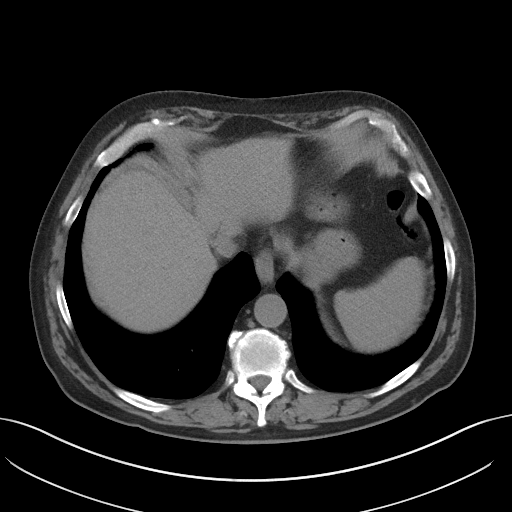
[im 93/97  soft-tissue]
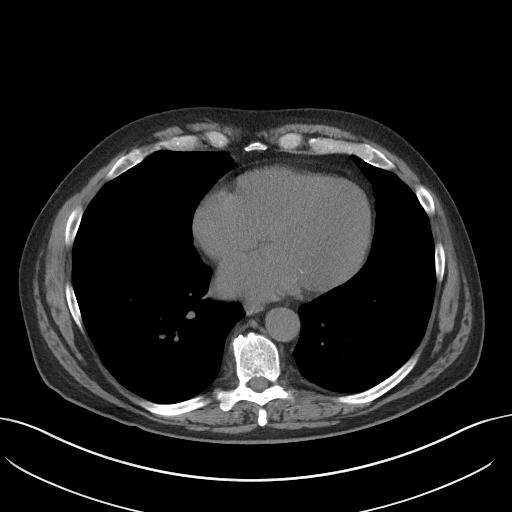

[Series 5: coronal · coronal · 0.76mm/px · 3 of 130 slices shown]
[im 44/130  soft-tissue]
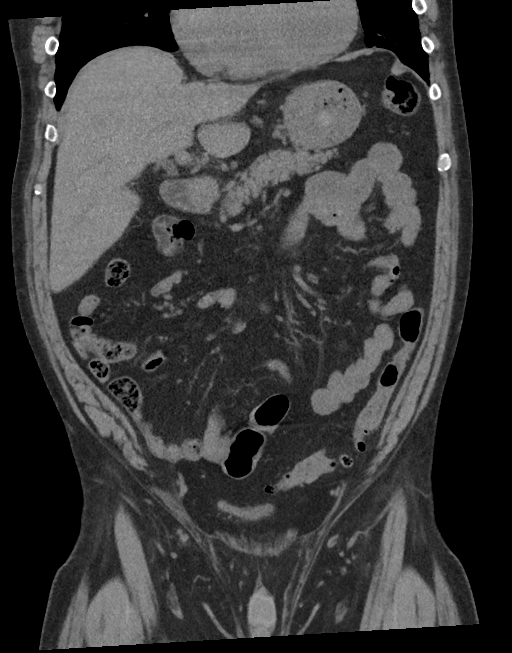
[im 58/130  soft-tissue]
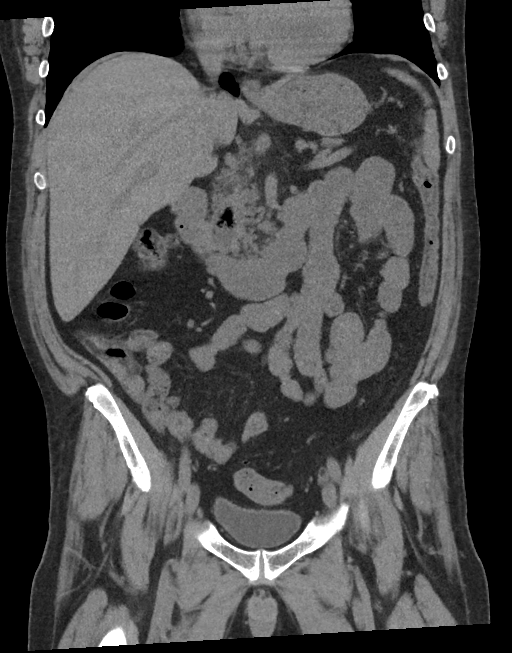
[im 72/130  soft-tissue]
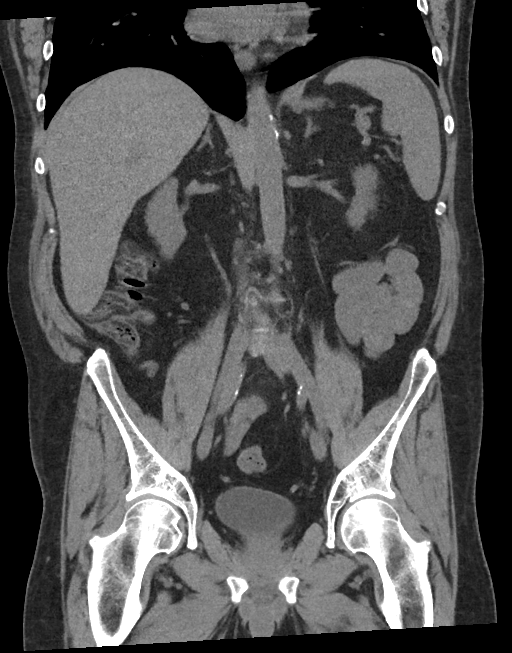

[16 of 46 positions shown; findings below may reference images not displayed]

FINDINGS: Lower chest: Top-normal size heart. Minimal atelectasis at the lung
bases. No acute abnormality noted.

Hepatobiliary: Cholecystectomy. Homogeneous appearance of the liver
without space-occupying mass nor biliary dilatation given
limitations of a noncontrast study.

Pancreas: Normal without ductal dilatation or inflammation.

Spleen: Normal size spleen.

Adrenals/Urinary Tract: Bilateral adrenal glands are normal.
Bilateral nephrolithiasis without hydronephrosis nor hydroureter
redemonstrated. The urinary bladder is physiologically distended
without focal mural thickening or calculi.

Stomach/Bowel: Moderate diffuse colonic spasm of the right and
transverse colon. Submucosal fat deposition may represent stigmata
of chronic inflammatory bowel. No acute bowel inflammation or
obstruction. Scattered colonic diverticulosis without acute
diverticulitis is identified along the sigmoid colon. No bowel
obstruction. The stomach and small intestine are unremarkable. No
evidence of acute appendicitis. The appendix is visualized and
normal in caliber. Suggestion of mild rectal prolapse, series [DATE].

Vascular/Lymphatic: No significant vascular findings are present. No
enlarged abdominal or pelvic lymph nodes.

Reproductive: Mild prostatic enlargement.

Other: No free air nor free fluid.

Musculoskeletal: No acute nor significant osseous findings.
IMPRESSION: 1. There is suggestion mild rectal prolapse, series [DATE]. Correlate
clinically.
2. Submucosal fat deposition of the ascending and transverse colon
likely representing stigmata of inflammatory bowel. No active
inflammatory process identified. Scattered colonic diverticulosis of
the sigmoid without acute diverticulitis.
3. Bilateral nephrolithiasis without obstructive uropathy.

## 2019-05-04 NOTE — ED Notes (Signed)
No answer when called several times from lobby 

## 2019-05-04 NOTE — ED Triage Notes (Signed)
Patient c/o abdominal pain and SOB post mechanical fall 1 hour ago. Patient reports patient reports that he did not use his inhaler today. Bruise noted to right/mid abdomen.

## 2019-05-07 ENCOUNTER — Inpatient Hospital Stay
Admission: EM | Admit: 2019-05-07 | Discharge: 2019-05-08 | DRG: 897 | Disposition: A | Discharge status: Home / Self Care | Payer: Medicaid Other | Attending: Internal Medicine | Admitting: Internal Medicine

## 2019-05-07 ENCOUNTER — Other Ambulatory Visit: Payer: Self-pay

## 2019-05-07 DIAGNOSIS — F10939 Alcohol use, unspecified with withdrawal, unspecified: Secondary | ICD-10-CM

## 2019-05-07 DIAGNOSIS — F10930 Alcohol use, unspecified with withdrawal, uncomplicated: Secondary | ICD-10-CM | POA: Diagnosis present

## 2019-05-07 DIAGNOSIS — D649 Anemia, unspecified: Secondary | ICD-10-CM | POA: Diagnosis present

## 2019-05-07 DIAGNOSIS — J45909 Unspecified asthma, uncomplicated: Secondary | ICD-10-CM | POA: Diagnosis present

## 2019-05-07 DIAGNOSIS — F1020 Alcohol dependence, uncomplicated: Secondary | ICD-10-CM

## 2019-05-07 DIAGNOSIS — F10230 Alcohol dependence with withdrawal, uncomplicated: Secondary | ICD-10-CM | POA: Diagnosis present

## 2019-05-07 DIAGNOSIS — K219 Gastro-esophageal reflux disease without esophagitis: Secondary | ICD-10-CM | POA: Diagnosis present

## 2019-05-07 DIAGNOSIS — F10239 Alcohol dependence with withdrawal, unspecified: Principal | ICD-10-CM | POA: Diagnosis present

## 2019-05-07 DIAGNOSIS — I10 Essential (primary) hypertension: Secondary | ICD-10-CM | POA: Diagnosis present

## 2019-05-07 DIAGNOSIS — F419 Anxiety disorder, unspecified: Secondary | ICD-10-CM | POA: Diagnosis present

## 2019-05-07 DIAGNOSIS — Z1159 Encounter for screening for other viral diseases: Secondary | ICD-10-CM

## 2019-05-07 LAB — URINALYSIS, COMPLETE (UACMP) WITH MICROSCOPIC
Bacteria, UA: NONE SEEN
Bilirubin Urine: NEGATIVE
Glucose, UA: NEGATIVE mg/dL
Hgb urine dipstick: NEGATIVE
Ketones, ur: 20 mg/dL — AB
Nitrite: NEGATIVE
Protein, ur: 30 mg/dL — AB
Specific Gravity, Urine: 1.018 (ref 1.005–1.030)
pH: 5 (ref 5.0–8.0)

## 2019-05-07 LAB — URINE DRUG SCREEN, QUALITATIVE (ARMC ONLY)
Amphetamines, Ur Screen: NOT DETECTED
Barbiturates, Ur Screen: NOT DETECTED
Benzodiazepine, Ur Scrn: NOT DETECTED
Cannabinoid 50 Ng, Ur ~~LOC~~: NOT DETECTED
Cocaine Metabolite,Ur ~~LOC~~: NOT DETECTED
MDMA (Ecstasy)Ur Screen: NOT DETECTED
Methadone Scn, Ur: NOT DETECTED
Opiate, Ur Screen: NOT DETECTED
Phencyclidine (PCP) Ur S: NOT DETECTED
Tricyclic, Ur Screen: NOT DETECTED

## 2019-05-07 LAB — ETHANOL: Alcohol, Ethyl (B): <10 mg/dL (ref <10)

## 2019-05-07 LAB — COMPREHENSIVE METABOLIC PANEL
ALT: 15 U/L (ref 0–44)
AST: 15 U/L (ref 15–41)
Albumin: 4.6 g/dL (ref 3.5–5.0)
Alkaline Phosphatase: 71 U/L (ref 38–126)
Anion gap: 15 (ref 5–15)
BUN: 17 mg/dL (ref 6–20)
CO2: 19 mmol/L — ABNORMAL LOW (ref 22–32)
Calcium: 9.2 mg/dL (ref 8.9–10.3)
Chloride: 107 mmol/L (ref 98–111)
Creatinine, Ser: 1.04 mg/dL (ref 0.61–1.24)
GFR calc Af Amer: >60 mL/min (ref >60)
GFR calc non Af Amer: >60 mL/min (ref >60)
Glucose, Bld: 152 mg/dL — ABNORMAL HIGH (ref 70–99)
Potassium: 3.5 mmol/L (ref 3.5–5.1)
Sodium: 141 mmol/L (ref 135–145)
Total Bilirubin: 2 mg/dL — ABNORMAL HIGH (ref 0.3–1.2)
Total Protein: 7.2 g/dL (ref 6.5–8.1)

## 2019-05-07 LAB — CBC WITH DIFFERENTIAL/PLATELET
Abs Immature Granulocytes: 0.02 10*3/uL (ref 0.00–0.07)
Basophils Absolute: 0 10*3/uL (ref 0.0–0.1)
Basophils Relative: 1 %
Eosinophils Absolute: 0 10*3/uL (ref 0.0–0.5)
Eosinophils Relative: 0 %
HCT: 40.3 % (ref 39.0–52.0)
Hemoglobin: 13 g/dL (ref 13.0–17.0)
Immature Granulocytes: 0 %
Lymphocytes Relative: 11 %
Lymphs Abs: 0.9 10*3/uL (ref 0.7–4.0)
MCH: 26.6 pg (ref 26.0–34.0)
MCHC: 32.3 g/dL (ref 30.0–36.0)
MCV: 82.6 fL (ref 80.0–100.0)
Monocytes Absolute: 1 10*3/uL (ref 0.1–1.0)
Monocytes Relative: 12 %
Neutro Abs: 6.4 10*3/uL (ref 1.7–7.7)
Neutrophils Relative %: 76 %
Platelets: 223 10*3/uL (ref 150–400)
RBC: 4.88 MIL/uL (ref 4.22–5.81)
RDW: 17.8 % — ABNORMAL HIGH (ref 11.5–15.5)
WBC: 8.4 10*3/uL (ref 4.0–10.5)
nRBC: 0 % (ref 0.0–0.2)

## 2019-05-07 LAB — TROPONIN I: Troponin I: <0.03 ng/mL (ref <0.03)

## 2019-05-07 MED ORDER — ENOXAPARIN SODIUM 40 MG/0.4ML ~~LOC~~ SOLN: 40.0000 mg | SUBCUTANEOUS | Status: DC

## 2019-05-07 MED ORDER — VITAMIN B-1 100 MG PO TABS: 100.0000 mg | ORAL_TABLET | Freq: Every day | ORAL | Status: DC

## 2019-05-07 MED ORDER — LORAZEPAM 2 MG/ML IJ SOLN
1.0000 mg | Freq: Four times a day (QID) | INTRAMUSCULAR | Status: DC | PRN
  Administered 2019-05-08: 1 mg via INTRAVENOUS
  Filled 2019-05-07: qty 1

## 2019-05-07 MED ORDER — ACETAMINOPHEN 325 MG PO TABS: 650.0000 mg | ORAL_TABLET | Freq: Four times a day (QID) | ORAL | Status: DC | PRN

## 2019-05-07 MED ORDER — FOLIC ACID 5 MG/ML IJ SOLN
1.0000 mg | Freq: Every day | INTRAMUSCULAR | Status: DC
  Administered 2019-05-07 – 2019-05-08 (×2): 1 mg via INTRAVENOUS
  Filled 2019-05-07 (×2): qty 0.2

## 2019-05-07 MED ORDER — ACETAMINOPHEN 650 MG RE SUPP: 650.0000 mg | Freq: Four times a day (QID) | RECTAL | Status: DC | PRN

## 2019-05-07 MED ORDER — LORAZEPAM 1 MG PO TABS: 1.0000 mg | ORAL_TABLET | Freq: Four times a day (QID) | ORAL | Status: DC | PRN

## 2019-05-07 MED ORDER — SODIUM CHLORIDE 0.9% FLUSH
3.0000 mL | Freq: Two times a day (BID) | INTRAVENOUS | Status: DC
  Administered 2019-05-08: 3 mL via INTRAVENOUS

## 2019-05-07 MED ORDER — ADULT MULTIVITAMIN W/MINERALS CH
1.0000 | ORAL_TABLET | Freq: Every day | ORAL | Status: DC
  Administered 2019-05-07 – 2019-05-08 (×2): 1 via ORAL
  Filled 2019-05-07 (×2): qty 1

## 2019-05-07 MED ORDER — FLUTICASONE FUROATE-VILANTEROL 100-25 MCG/INH IN AEPB
1.0000 | INHALATION_SPRAY | Freq: Every day | RESPIRATORY_TRACT | Status: DC
  Administered 2019-05-08: 1 via RESPIRATORY_TRACT
  Filled 2019-05-07: qty 28

## 2019-05-07 MED ORDER — LORAZEPAM 2 MG/ML IJ SOLN
2.0000 mg | INTRAMUSCULAR | Status: DC | PRN
  Administered 2019-05-07 – 2019-05-08 (×2): 2 mg via INTRAVENOUS
  Filled 2019-05-07 (×2): qty 1

## 2019-05-07 MED ORDER — GABAPENTIN 100 MG PO CAPS
200.0000 mg | ORAL_CAPSULE | Freq: Three times a day (TID) | ORAL | Status: DC
  Administered 2019-05-08: 200 mg via ORAL
  Filled 2019-05-07 (×3): qty 2

## 2019-05-07 MED ORDER — FOLIC ACID 1 MG PO TABS
1.0000 mg | ORAL_TABLET | Freq: Every day | ORAL | Status: DC
  Filled 2019-05-07: qty 1

## 2019-05-07 MED ORDER — LORAZEPAM 2 MG/ML IJ SOLN
1.0000 mg | Freq: Once | INTRAMUSCULAR | Status: AC
  Administered 2019-05-07: 1 mg via INTRAVENOUS
  Filled 2019-05-07: qty 1

## 2019-05-07 MED ORDER — SODIUM CHLORIDE 0.9 % IV SOLN
INTRAVENOUS | Status: DC
  Administered 2019-05-08: 01:00:00 via INTRAVENOUS

## 2019-05-07 MED ORDER — THIAMINE HCL 100 MG/ML IJ SOLN: 100.0000 mg | Freq: Every day | INTRAMUSCULAR | Status: DC

## 2019-05-07 MED ORDER — FOLIC ACID 1 MG PO TABS: 1.0000 mg | ORAL_TABLET | Freq: Every day | ORAL | Status: DC

## 2019-05-07 MED ORDER — POLYETHYLENE GLYCOL 3350 17 G PO PACK: 17.0000 g | PACK | Freq: Every day | ORAL | Status: DC | PRN

## 2019-05-07 MED ORDER — LORAZEPAM 2 MG/ML IJ SOLN
2.0000 mg | INTRAMUSCULAR | Status: AC
  Administered 2019-05-07: 2 mg via INTRAVENOUS
  Filled 2019-05-07: qty 1

## 2019-05-07 MED ORDER — THIAMINE HCL 100 MG/ML IJ SOLN
100.0000 mg | Freq: Every day | INTRAMUSCULAR | Status: DC
  Administered 2019-05-07: 100 mg via INTRAVENOUS
  Filled 2019-05-07: qty 2

## 2019-05-07 MED ORDER — ONDANSETRON HCL 4 MG/2ML IJ SOLN: 4.0000 mg | Freq: Four times a day (QID) | INTRAMUSCULAR | Status: DC | PRN

## 2019-05-07 MED ORDER — ALLOPURINOL 300 MG PO TABS
300.0000 mg | ORAL_TABLET | Freq: Every day | ORAL | Status: DC
  Administered 2019-05-08: 300 mg via ORAL
  Filled 2019-05-07: qty 1

## 2019-05-07 MED ORDER — LISINOPRIL 20 MG PO TABS
20.0000 mg | ORAL_TABLET | Freq: Every day | ORAL | Status: DC
  Administered 2019-05-08: 20 mg via ORAL
  Filled 2019-05-07: qty 1

## 2019-05-07 MED ORDER — ONDANSETRON HCL 4 MG/2ML IJ SOLN
4.0000 mg | Freq: Once | INTRAMUSCULAR | Status: AC
  Administered 2019-05-07: 4 mg via INTRAVENOUS
  Filled 2019-05-07: qty 2

## 2019-05-07 MED ORDER — ADULT MULTIVITAMIN W/MINERALS CH: 1.0000 | ORAL_TABLET | Freq: Every day | ORAL | Status: DC

## 2019-05-07 MED ORDER — ONDANSETRON HCL 4 MG PO TABS: 4.0000 mg | ORAL_TABLET | Freq: Four times a day (QID) | ORAL | Status: DC | PRN

## 2019-05-07 MED ORDER — SODIUM CHLORIDE 0.9 % IV SOLN
Freq: Once | INTRAVENOUS | Status: AC
  Administered 2019-05-07: 22:00:00 via INTRAVENOUS

## 2019-05-07 NOTE — ED Triage Notes (Signed)
Pt states he is anxious, has not consumed ETOH in 3 days. Pt with history of daily ETOH abuse. Pt believes he is in ETOH withdrawal. Pt very anxious, shaking, nauseated, vomiting, sweaty.

## 2019-05-07 NOTE — ED Provider Notes (Signed)
[facility] Emergency Department Provider Note       Time seen: ----------------------------------------- 7:54 PM on 05/07/2019 -----------------------------------------   I have reviewed the triage vital signs and the nursing notes.  HISTORY   Chief Complaint Anxiety    HPI Miguel [name] is a 58 y.o. male with a history of severe and chronic alcohol abuse, asthma, GERD, gout, hypertension, kidney stones, OCD, renal colic who presents to the ED for anxiety and alcohol withdrawal.  Patient has a history of daily alcohol use and believes he is in withdrawal.  He is anxious, shaky, nauseous, vomiting and sweaty on arrival here.  Pain is 9 of 10 and generalized.  Past Medical History:  Diagnosis Date  . Alcohol abuse   . Asthma   . GERD (gastroesophageal reflux disease)   . Gout   . Hypertension   . Kidney stone   . OCD (obsessive compulsive disorder)   . Renal colic     Patient Active Problem List   Diagnosis Date Noted  . Alcoholic intoxication with complication ([place])   . Sepsis ([place]) 03/08/2019  . Breast lump or mass 10/04/2018  . Sepsis secondary to UTI ([place]) 09/14/2018  . Asthma 07/07/2018  . GERD (gastroesophageal reflux disease) 07/07/2018  . OCD (obsessive compulsive disorder) 07/07/2018  . Self-inflicted laceration of wrist 03/10/2018  . Leg hematoma 12/25/2017  . Suicide and self-inflicted injury by cutting and piercing instrument ([place]) 03/10/2017  . Severe recurrent major 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 ([place]) 03/09/2017  . Substance induced mood disorder ([place]) 08/15/2016  . Involuntary commitment 08/15/2016  . Alcohol use disorder, severe, dependence ([place]) 02/05/2016  . Hypertension 12/05/2015  . Tachycardia 12/05/2015  . Gout 11/13/2015  . Chronic back pain 05/02/2015    Past Surgical History:  Procedure Laterality Date  . CHOLECYSTECTOMY  2012  . EXTRACORPOREAL SHOCK WAVE LITHOTRIPSY Left 01/12/2019   Procedure:  EXTRACORPOREAL SHOCK WAVE LITHOTRIPSY (ESWL);  Surgeon: Billey Co, MD;  Location: [facility] ORS;  Service: Urology;  Laterality: Left;    Allergies Percocet [oxycodone-acetaminophen]  Social History Social History   Tobacco Use  . Smoking status: Former Research scientist (life sciences)  . Smokeless tobacco: Never Used  . Tobacco comment: quit 30 years ago  Substance Use Topics  . Alcohol use: Yes    Alcohol/week: 2.0 standard drinks    Types: 2 Glasses of wine per week    Comment: with lunch  . Drug use: No   Review of Systems Constitutional: Negative for fever. Cardiovascular: Negative for chest pain. Respiratory: Negative for shortness of breath. Gastrointestinal: Negative for abdominal pain, positive for nausea and vomiting Musculoskeletal: Negative for back pain. Skin: Positive for diaphoresis Neurological: Negative for headaches, focal weakness or numbness.  Positive for tremor Psychiatric: Positive for anxiety  All systems negative/normal/unremarkable except as stated in the HPI  ____________________________________________   PHYSICAL EXAM:  VITAL SIGNS: ED Triage Vitals  Enc Vitals Group     BP 05/07/19 1936 (!) 191/81     Pulse Rate 05/07/19 1936 (!) 140     Resp 05/07/19 1936 (!) 24     Temp 05/07/19 1936 98.9 F (37.2 C)     Temp Source 05/07/19 1936 Oral     SpO2 05/07/19 1936 98 %     Weight 05/07/19 1937 185 lb (83.9 kg)     Height 05/07/19 1937 6' (1.829 m)     Head Circumference --      Peak Flow --      Pain Score  05/07/19 1937 9     Pain Loc --      Pain Edu? --      Excl. in [place]? --    Constitutional: Alert and oriented.  Mild to moderate distress Eyes: Conjunctivae are normal. Normal extraocular movements. ENT      Head: Normocephalic and atraumatic.      Nose: No congestion/rhinnorhea.      Mouth/Throat: Mucous membranes are moist.      Neck: No stridor. Cardiovascular: Rapid rate, regular rhythm. No murmurs, rubs, or gallops. Respiratory: Normal respiratory  effort without tachypnea nor retractions. Breath sounds are clear and equal bilaterally. No wheezes/rales/rhonchi. Gastrointestinal: Soft and nontender. Normal bowel sounds Musculoskeletal: Nontender with normal range of motion in extremities. No lower extremity tenderness nor edema. Neurologic:  Normal speech and language. No gross focal neurologic deficits are appreciated.  Tremor is noted Skin:  Skin is warm, mild diaphoresis is noted Psychiatric: Mood and affect are normal. Speech and behavior are normal.  ____________________________________________  EKG: Interpreted by me.  Sinus tachycardia with rate of 111 bpm, normal PR interval, old inferior infarct, normal QT  ____________________________________________  ED COURSE:  As part of my medical decision making, I reviewed the following data within the [place] History obtained from family if available, nursing notes, old chart and ekg, as well as notes from prior ED visits. Patient presented for alcohol withdrawal, we will assess with labs as indicated at this time. Clinical Course as of May 06 2105  Sun May 07, 2019  2006 Patient appears to be in severe withdrawal or early DT's on arrival.   [JW]    Clinical Course User Index [JW] Earleen Newport, MD   Procedures  Miguel [name] was evaluated in Emergency Department on 05/07/2019 for the symptoms described in the history of present illness. He was evaluated in the context of the global COVID-19 pandemic, which necessitated consideration that the patient might be at risk for infection with the SARS-CoV-2 virus that causes COVID-19. Institutional protocols and algorithms that pertain to the evaluation of patients at risk for COVID-19 are in a state of rapid change based on information released by regulatory bodies including the CDC and federal and state organizations. These policies and algorithms were followed during the patient's care in the ED.   ____________________________________________   LABS (pertinent positives/negatives)  Labs Reviewed  CBC WITH DIFFERENTIAL/PLATELET - Abnormal; Notable for the following components:      Result Value   RDW 17.8 (*)    All other components within normal limits  COMPREHENSIVE METABOLIC PANEL - Abnormal; Notable for the following components:   CO2 19 (*)    Glucose, Bld 152 (*)    Total Bilirubin 2.0 (*)    All other components within normal limits  URINALYSIS, COMPLETE (UACMP) WITH MICROSCOPIC - Abnormal; Notable for the following components:   Color, Urine YELLOW (*)    APPearance HAZY (*)    Ketones, ur 20 (*)    Protein, ur 30 (*)    Leukocytes,Ua TRACE (*)    All other components within normal limits  TROPONIN I  URINE DRUG SCREEN, QUALITATIVE ([facility] ONLY)  ETHANOL   CRITICAL CARE Performed by: Laurence Aly   Total critical care time: 30 minutes  Critical care time was exclusive of separately billable procedures and treating other patients.  Critical care was necessary to treat or prevent imminent or life-threatening deterioration.  Critical care was time spent personally by me on the following  activities: development of treatment plan with patient and/or surrogate as well as nursing, discussions with consultants, evaluation of patient's response to treatment, examination of patient, obtaining history from patient or surrogate, ordering and performing treatments and interventions, ordering and review of laboratory studies, ordering and review of radiographic studies, pulse oximetry and re-evaluation of patient's condition.  ____________________________________________   DIFFERENTIAL DIAGNOSIS   DTs, alcohol withdrawal, anxiety, dehydration, electrolyte abnormality  FINAL ASSESSMENT AND PLAN  Acute alcohol withdrawal syndrome   Plan: The patient had presented for severe alcohol withdrawal. Patient's labs not reveal any acute process. Patient has been started  on CIWA protocol.  He has received 2 mg of IV Ativan as well as Zofran as well.  He may require Precedex.  He remains hypertensive and tachycardic with tremor.  I will discuss with the hospitalist for admission.   Laurence Aly, MD    Note: This note was generated in part or whole with voice recognition software. Voice recognition is usually quite accurate but there are transcription errors that can and very often do occur. I apologize for any typographical errors that were not detected and corrected.     Earleen Newport, MD 05/07/19 2107

## 2019-05-07 NOTE — H&P (Signed)
[facility] [place] NAME: Miguel [name]    MR#:  [id number]  DATE OF BIRTH:  1961-04-02  DATE OF ADMISSION:  05/07/2019  PRIMARY CARE PHYSICIAN: System, Pcp Not In   REQUESTING/REFERRING PHYSICIAN: Lenise Arena, MD  CHIEF COMPLAINT:   Chief Complaint  Patient presents with  . Anxiety    HISTORY OF PRESENT ILLNESS:  Miguel [name]  is a 58 y.o. male with a known history of alcohol abuse, asthma, GERD, gout, hypertension, OCD.  Patient presented to the emergency room for anxiety with alcohol withdrawal.  Patient has a history of daily alcohol consumption of 1 pint or greater vodka per day.  He believes he is in alcohol withdrawal.  He reports his last alcoholic drink was 2 days ago.  He has been experiencing nausea and vomiting as well as nervousness and shaking for 2 days.  He denies chest pain, shortness of breath, diarrhea.  He denies fevers or chills.  He denies abdominal pain.  He denies hematemesis, hematochezia, or melena.  Ethyl alcohol level is less than 10 in the emergency room.  Rapid COVID testing is pending.  We have admitted him to the hospitalist service for further management. PAST MEDICAL HISTORY:   Past Medical History:  Diagnosis Date  . Alcohol abuse   . Asthma   . GERD (gastroesophageal reflux disease)   . Gout   . Hypertension   . Kidney stone   . OCD (obsessive compulsive disorder)   . Renal colic     PAST SURGICAL HISTORY:   Past Surgical History:  Procedure Laterality Date  . CHOLECYSTECTOMY  2012  . EXTRACORPOREAL SHOCK WAVE LITHOTRIPSY Left 01/12/2019   Procedure: EXTRACORPOREAL SHOCK WAVE LITHOTRIPSY (ESWL);  Surgeon: Billey Co, MD;  Location: [facility] ORS;  Service: Urology;  Laterality: Left;    SOCIAL HISTORY:   Social History   Tobacco Use  . Smoking status: Former Research scientist (life sciences)  . Smokeless tobacco: Never Used  . Tobacco comment: quit 30 years ago  Substance Use Topics  . Alcohol use: Yes   Alcohol/week: 2.0 standard drinks    Types: 2 Glasses of wine per week    Comment: with lunch    FAMILY HISTORY:   Family History  Problem Relation Age of Onset  . Alcohol abuse Father   . Breast cancer Mother 31    DRUG ALLERGIES:   Allergies  Allergen Reactions  . Percocet [Oxycodone-Acetaminophen] Nausea And Vomiting    REVIEW OF SYSTEMS:   Review of Systems  Constitutional: Positive for malaise/fatigue. Negative for chills, diaphoresis and fever.  HENT: Negative for congestion, sinus pain and sore throat.   Eyes: Negative for blurred vision, double vision and pain.  Respiratory: Negative for cough, sputum production and shortness of breath.   Cardiovascular: Negative for chest pain, palpitations and orthopnea.  Gastrointestinal: Positive for nausea and vomiting. Negative for abdominal pain, constipation and diarrhea.  Genitourinary: Negative for dysuria, flank pain and hematuria.  Musculoskeletal: Negative for falls, joint pain and myalgias.  Skin: Negative.  Negative for itching and rash.  Neurological: Positive for tremors. Negative for dizziness, focal weakness, seizures, loss of consciousness and headaches.  Psychiatric/Behavioral: Positive for substance abuse (Alcoholism). The patient is nervous/anxious.      MEDICATIONS AT HOME:   Prior to Admission medications   Medication Sig Start Date End Date Taking? Authorizing Provider  albuterol (VENTOLIN HFA) 108 (90 Base) MCG/ACT inhaler Inhale 2 puffs into the lungs every 4 (four)  hours as needed. Patient taking differently: Inhale 2 puffs into the lungs every 4 (four) hours as needed for wheezing or shortness of breath.  09/01/18  Yes Tukov-Yual, Arlyss Gandy, NP  allopurinol (ZYLOPRIM) 300 MG tablet Take 1 tablet (300 mg total) by mouth daily. 09/01/18  Yes Tukov-Yual, Magdalene S, NP  colchicine 0.6 MG tablet Take 1 tablet (0.6 mg total) by mouth daily. Patient taking differently: Take 0.6 mg by mouth daily as  needed.  03/09/19  Yes Lang Snow, NP  docusate sodium (COLACE) 100 MG capsule Take 1 tablet once or twice daily as needed for constipation while taking narcotic pain medicine 01/09/19  Yes Hinda Kehr, MD  Fluticasone-Salmeterol (ADVAIR DISKUS) 100-50 MCG/DOSE AEPB Inhale 1 puff into the lungs 2 (two) times daily. 09/01/18  Yes Tukov-Yual, Arlyss Gandy, NP  folic acid (FOLVITE) 1 MG tablet Take 1 tablet (1 mg total) by mouth daily. 09/01/18  Yes Tukov-Yual, Magdalene S, NP  gabapentin (NEURONTIN) 100 MG capsule Take 2 capsules (200 mg total) by mouth 3 (three) times daily. 04/21/19  Yes Patrecia Pour, NP  lisinopril (PRINIVIL,ZESTRIL) 20 MG tablet Take 1 tablet (20 mg total) by mouth daily. 02/21/19  Yes Iloabachie, Chioma E, NP  Multiple Vitamins-Minerals (MULTIVITAMIN WITH MINERALS) tablet Take 1 tablet by mouth daily. 09/01/18  Yes Tukov-Yual, Magdalene S, NP  naltrexone (DEPADE) 50 MG tablet Take 1 tablet (50 mg total) by mouth daily for 30 days. 04/12/19 05/12/19 Yes Iloabachie, Chioma E, NP  tiZANidine (ZANAFLEX) 4 MG tablet Take 0.5 tablets (2 mg total) by mouth every 8 (eight) hours as needed for muscle spasms. 04/26/19  Yes Iloabachie, Chioma E, NP      VITAL SIGNS:  Blood pressure (!) 186/120, pulse (!) 111, temperature 98.9 F (37.2 C), temperature source Oral, resp. rate 16, height 6' (1.829 m), weight 83.9 kg, SpO2 97 %.  PHYSICAL EXAMINATION:  Physical Exam Vitals signs and nursing note reviewed.  Constitutional:      General: He is not in acute distress.    Appearance: Normal appearance. He is not ill-appearing.  HENT:     Head: Normocephalic.     Right Ear: External ear normal.     Left Ear: External ear normal.     Nose: Nose normal.     Mouth/Throat:     Mouth: Mucous membranes are moist.     Pharynx: Oropharynx is clear.  Eyes:     General: No scleral icterus.    Extraocular Movements: Extraocular movements intact.     Conjunctiva/sclera: Conjunctivae  normal.     Pupils: Pupils are equal, round, and reactive to light.  Neck:     Musculoskeletal: Normal range of motion and neck supple. No muscular tenderness.  Cardiovascular:     Rate and Rhythm: Normal rate and regular rhythm.     Pulses: Normal pulses.     Heart sounds: Normal heart sounds. No murmur. No friction rub. No gallop.   Pulmonary:     Effort: Pulmonary effort is normal. No respiratory distress.     Breath sounds: Normal breath sounds. No wheezing, rhonchi or rales.  Abdominal:     General: There is no distension.     Palpations: Abdomen is soft.     Tenderness: There is no right CVA tenderness, left CVA tenderness, guarding or rebound.  Musculoskeletal: Normal range of motion.        General: No swelling or tenderness.  Skin:    General: Skin is warm and dry.  Capillary Refill: Capillary refill takes less than 2 seconds.     Coloration: Skin is not jaundiced.     Findings: No rash.  Neurological:     General: No focal deficit present.     Mental Status: He is alert and oriented to person, place, and time.     Motor: No weakness.  Psychiatric:        Behavior: Behavior normal.     LABORATORY PANEL:   CBC Recent Labs  Lab 05/07/19 2005  WBC 8.4  HGB 13.0  HCT 40.3  PLT 223   ------------------------------------------------------------------------------------------------------------------  Chemistries  Recent Labs  Lab 05/07/19 2005  NA 141  K 3.5  CL 107  CO2 19*  GLUCOSE 152*  BUN 17  CREATININE 1.04  CALCIUM 9.2  AST 15  ALT 15  ALKPHOS 71  BILITOT 2.0*   ------------------------------------------------------------------------------------------------------------------  Cardiac Enzymes Recent Labs  Lab 05/07/19 2005  TROPONINI <0.03   ------------------------------------------------------------------------------------------------------------------  RADIOLOGY:  No results found.    IMPRESSION AND PLAN:   1. alcohol  withdrawal - CIWA protocol has been initiated - Patient has normal saline infusing to peripheral IV at 75 cc/h. - Will treat nausea and vomiting with IV antiemetic  2.  Hypertension - Lisinopril continued. - We will manage persistent hypertension expectantly - Patient is on telemetry monitoring  3.  Asthma - Advair and as needed albuterol continued -Stable  4.  GERD - PPI therapy initiated  DVT and PPI prophylaxis  All the records are reviewed and case discussed with ED provider. The plan of care was discussed in details with the patient (and family). I answered all questions. The patient agreed to proceed with the above mentioned plan. Further management will depend upon hospital course.   CODE STATUS: Full code  TOTAL TIME TAKING CARE OF THIS PATIENT: 45 minutes.    [place] on 05/07/2019 at 11:28 PM  Pager - [phone]  After 6pm go to [email or web address] - Proofreader  [facility]  Office  [phone]  CC: Primary care physician; System, Pcp Not In   Note: This dictation was prepared with Dragon dictation along with smaller phrase technology. Any transcriptional errors that result from this process are unintentional.

## 2019-05-07 NOTE — ED Notes (Signed)
Anxiety attack per EMS. EMS reports patient told him he hasn't had any alcohol in three days. Patient's vitals with EMS 98.3, 98% RA, p131, 143/94

## 2019-05-08 LAB — TSH: TSH: 0.545 u[IU]/mL (ref 0.350–4.500)

## 2019-05-08 LAB — BASIC METABOLIC PANEL
Anion gap: 7 (ref 5–15)
BUN: 15 mg/dL (ref 6–20)
CO2: 24 mmol/L (ref 22–32)
Calcium: 8.6 mg/dL — ABNORMAL LOW (ref 8.9–10.3)
Chloride: 110 mmol/L (ref 98–111)
Creatinine, Ser: 0.88 mg/dL (ref 0.61–1.24)
GFR calc Af Amer: >60 mL/min (ref >60)
GFR calc non Af Amer: >60 mL/min (ref >60)
Glucose, Bld: 98 mg/dL (ref 70–99)
Potassium: 3.3 mmol/L — ABNORMAL LOW (ref 3.5–5.1)
Sodium: 141 mmol/L (ref 135–145)

## 2019-05-08 LAB — CBC
HCT: 37.2 % — ABNORMAL LOW (ref 39.0–52.0)
Hemoglobin: 11.6 g/dL — ABNORMAL LOW (ref 13.0–17.0)
MCH: 26.7 pg (ref 26.0–34.0)
MCHC: 31.2 g/dL (ref 30.0–36.0)
MCV: 85.5 fL (ref 80.0–100.0)
Platelets: 209 10*3/uL (ref 150–400)
RBC: 4.35 MIL/uL (ref 4.22–5.81)
RDW: 17.7 % — ABNORMAL HIGH (ref 11.5–15.5)
WBC: 6.6 10*3/uL (ref 4.0–10.5)
nRBC: 0 % (ref 0.0–0.2)

## 2019-05-08 LAB — SARS CORONAVIRUS 2 BY RT PCR (HOSPITAL ORDER, PERFORMED IN ~~LOC~~ HOSPITAL LAB): SARS Coronavirus 2: NEGATIVE

## 2019-05-08 MED ORDER — CLONIDINE HCL 0.1 MG PO TABS
0.1000 mg | ORAL_TABLET | ORAL | Status: AC
  Administered 2019-05-08: 0.1 mg via ORAL
  Filled 2019-05-08: qty 1

## 2019-05-08 MED ORDER — VITAMIN B-1 100 MG PO TABS
100.0000 mg | ORAL_TABLET | Freq: Every day | ORAL | Status: DC
  Administered 2019-05-08: 100 mg via ORAL
  Filled 2019-05-08: qty 1

## 2019-05-08 MED ORDER — PANTOPRAZOLE SODIUM 40 MG IV SOLR
40.0000 mg | INTRAVENOUS | Status: DC
  Administered 2019-05-08: 40 mg via INTRAVENOUS
  Filled 2019-05-08: qty 40

## 2019-05-08 MED ORDER — NALTREXONE HCL 50 MG PO TABS: 50.0000 mg | ORAL_TABLET | Freq: Every day | ORAL | 0 refills | Status: AC

## 2019-05-08 MED ORDER — SODIUM CHLORIDE 0.9 % IV SOLN
INTRAVENOUS | Status: DC
  Administered 2019-05-08: 03:00:00 via INTRAVENOUS

## 2019-05-08 MED ORDER — POTASSIUM CHLORIDE CRYS ER 20 MEQ PO TBCR
40.0000 meq | EXTENDED_RELEASE_TABLET | Freq: Two times a day (BID) | ORAL | Status: DC
  Administered 2019-05-08: 40 meq via ORAL
  Filled 2019-05-08: qty 2

## 2019-05-08 MED ORDER — CHLORDIAZEPOXIDE HCL 25 MG PO CAPS: ORAL_CAPSULE | ORAL | 0 refills | Status: DC

## 2019-05-08 MED ORDER — THIAMINE HCL 100 MG/ML IJ SOLN: 100.0000 mg | Freq: Every day | INTRAMUSCULAR | Status: DC

## 2019-05-08 NOTE — ED Notes (Signed)
ED TO INPATIENT HANDOFF REPORT  ED Nurse Name and Phone #:   [phone]  S Name/Age/Gender Miguel [name] 58 y.o. male Room/Bed: ED17A/ED17A  Code Status   Code Status: Full Code  Home/SNF/Other Home Patient oriented to: self Is this baseline? Yes   Triage Complete: Triage complete  Chief Complaint [facility] - Anxiety  Triage Note Pt states he is anxious, has not consumed ETOH in 3 days. Pt with history of daily ETOH abuse. Pt believes he is in ETOH withdrawal. Pt very anxious, shaking, nauseated, vomiting, sweaty.    Allergies Allergies  Allergen Reactions  . Percocet [Oxycodone-Acetaminophen] Nausea And Vomiting    Level of Care/Admitting Diagnosis ED Disposition    ED Disposition Condition [facility] Area: [place] [[id number]]  Level of Care: Med-Surg [16]  Covid Evaluation: Screening Protocol (No Symptoms)  Diagnosis: Alcohol withdrawal ([place]) [291.81.ICD-9-CM]  Admitting Physician: Mayer Camel [[id number]]  Attending Physician: Mayer Camel [[id number]]  Estimated length of stay: past midnight tomorrow  Certification:: I certify this patient will need inpatient services for at least 2 midnights  PT Class (Do Not Modify): Inpatient [101]  PT Acc Code (Do Not Modify): Private [1]       B Medical/Surgery History Past Medical History:  Diagnosis Date  . Alcohol abuse   . Asthma   . GERD (gastroesophageal reflux disease)   . Gout   . Hypertension   . Kidney stone   . OCD (obsessive compulsive disorder)   . Renal colic    Past Surgical History:  Procedure Laterality Date  . CHOLECYSTECTOMY  2012  . EXTRACORPOREAL SHOCK WAVE LITHOTRIPSY Left 01/12/2019   Procedure: EXTRACORPOREAL SHOCK WAVE LITHOTRIPSY (ESWL);  Surgeon: Billey Co, MD;  Location: [facility] ORS;  Service: Urology;  Laterality: Left;     A IV Location/Drains/Wounds Patient Lines/Drains/Airways Status   Active Line/Drains/Airways    Name:   Placement  date:   Placement time:   Site:   Days:   Peripheral IV 05/07/19 Right Forearm   05/07/19    2108    Forearm   1   Wound / Incision (Open or Dehisced) Leg Right   -    -    Leg             Intake/Output Last 24 hours No intake or output data in the 24 hours ending 05/08/19 0124  Labs/Imaging Results for orders placed or performed during the hospital encounter of 05/07/19 (from the past 48 hour(s))  CBC with Differential/Platelet     Status: Abnormal   Collection Time: 05/07/19  8:05 PM  Result Value Ref Range   WBC 8.4 4.0 - 10.5 K/uL   RBC 4.88 4.22 - 5.81 MIL/uL   Hemoglobin 13.0 13.0 - 17.0 g/dL   HCT 40.3 39.0 - 52.0 %   MCV 82.6 80.0 - 100.0 fL   MCH 26.6 26.0 - 34.0 pg   MCHC 32.3 30.0 - 36.0 g/dL   RDW 17.8 (H) 11.5 - 15.5 %   Platelets 223 150 - 400 K/uL   nRBC 0.0 0.0 - 0.2 %   Neutrophils Relative % 76 %   Neutro Abs 6.4 1.7 - 7.7 K/uL   Lymphocytes Relative 11 %   Lymphs Abs 0.9 0.7 - 4.0 K/uL   Monocytes Relative 12 %   Monocytes Absolute 1.0 0.1 - 1.0 K/uL   Eosinophils Relative 0 %   Eosinophils Absolute 0.0 0.0 - 0.5 K/uL  Basophils Relative 1 %   Basophils Absolute 0.0 0.0 - 0.1 K/uL   Immature Granulocytes 0 %   Abs Immature Granulocytes 0.02 0.00 - 0.07 K/uL    Comment: Performed at [facility], [address] [place], [place] [address]  Comprehensive metabolic panel     Status: Abnormal   Collection Time: 05/07/19  8:05 PM  Result Value Ref Range   Sodium 141 135 - 145 mmol/L   Potassium 3.5 3.5 - 5.1 mmol/L   Chloride 107 98 - 111 mmol/L   CO2 19 (L) 22 - 32 mmol/L   Glucose, Bld 152 (H) 70 - 99 mg/dL   BUN 17 6 - 20 mg/dL   Creatinine, Ser 1.04 0.61 - 1.24 mg/dL   Calcium 9.2 8.9 - 10.3 mg/dL   Total Protein 7.2 6.5 - 8.1 g/dL   Albumin 4.6 3.5 - 5.0 g/dL   AST 15 15 - 41 U/L   ALT 15 0 - 44 U/L   Alkaline Phosphatase 71 38 - 126 U/L   Total Bilirubin 2.0 (H) 0.3 - 1.2 mg/dL   GFR calc non Af Amer >60 >60 mL/min   GFR calc Af  Amer >60 >60 mL/min   Anion gap 15 5 - 15    Comment: Performed at [facility], [address] [place], [place] [address]  Urinalysis, Complete w Microscopic     Status: Abnormal   Collection Time: 05/07/19  8:05 PM  Result Value Ref Range   Color, Urine YELLOW (A) YELLOW   APPearance HAZY (A) CLEAR   Specific Gravity, Urine 1.018 1.005 - 1.030   pH 5.0 5.0 - 8.0   Glucose, UA NEGATIVE NEGATIVE mg/dL   Hgb urine dipstick NEGATIVE NEGATIVE   Bilirubin Urine NEGATIVE NEGATIVE   Ketones, ur 20 (A) NEGATIVE mg/dL   Protein, ur 30 (A) NEGATIVE mg/dL   Nitrite NEGATIVE NEGATIVE   Leukocytes,Ua TRACE (A) NEGATIVE   RBC / HPF 0-5 0 - 5 RBC/hpf   WBC, UA 11-20 0 - 5 WBC/hpf   Bacteria, UA NONE SEEN NONE SEEN   Squamous Epithelial / LPF 0-5 0 - 5   Mucus PRESENT    Hyaline Casts, UA PRESENT     Comment: Performed at [facility], [address] [place], [place] [address]  Urine Drug Screen, Qualitative ([facility] only)     Status: None   Collection Time: 05/07/19  8:05 PM  Result Value Ref Range   Tricyclic, Ur Screen NONE DETECTED NONE DETECTED   Amphetamines, Ur Screen NONE DETECTED NONE DETECTED   MDMA (Ecstasy)Ur Screen NONE DETECTED NONE DETECTED   Cocaine Metabolite,Ur [place] NONE DETECTED NONE DETECTED   Opiate, Ur Screen NONE DETECTED NONE DETECTED   Phencyclidine (PCP) Ur S NONE DETECTED NONE DETECTED   Cannabinoid 50 Ng, Ur [place] NONE DETECTED NONE DETECTED   Barbiturates, Ur Screen NONE DETECTED NONE DETECTED   Benzodiazepine, Ur Scrn NONE DETECTED NONE DETECTED   Methadone Scn, Ur NONE DETECTED NONE DETECTED    Comment: (NOTE) Tricyclics + metabolites, urine    Cutoff 1000 ng/mL Amphetamines + metabolites, urine  Cutoff 1000 ng/mL MDMA (Ecstasy), urine              Cutoff 500 ng/mL Cocaine Metabolite, urine          Cutoff 300 ng/mL Opiate + metabolites, urine        Cutoff 300 ng/mL Phencyclidine (PCP), urine         Cutoff 25  ng/mL Cannabinoid, urine                  Cutoff 50 ng/mL Barbiturates + metabolites, urine  Cutoff 200 ng/mL Benzodiazepine, urine              Cutoff 200 ng/mL Methadone, urine                   Cutoff 300 ng/mL The urine drug screen provides only a preliminary, unconfirmed analytical test result and should not be used for non-medical purposes. Clinical consideration and professional judgment should be applied to any positive drug screen result due to possible interfering substances. A more specific alternate chemical method must be used in order to obtain a confirmed analytical result. Gas chromatography / mass spectrometry (GC/MS) is the preferred confirmat ory method. Performed at [facility], [address] [place], [place] [address]   Troponin I - ONCE - STAT     Status: None   Collection Time: 05/07/19  8:05 PM  Result Value Ref Range   Troponin I <0.03 <0.03 ng/mL    Comment: Performed at [facility], [address] [place], [place] [address]  TSH     Status: None   Collection Time: 05/07/19  8:05 PM  Result Value Ref Range   TSH 0.545 0.350 - 4.500 uIU/mL    Comment: Performed by a 3rd Generation assay with a functional sensitivity of <=0.01 uIU/mL. Performed at [facility], [address] [place], [place] [address]   Ethanol     Status: None   Collection Time: 05/07/19  9:00 PM  Result Value Ref Range   Alcohol, Ethyl (B) <10 <10 mg/dL    Comment: (NOTE) Lowest detectable limit for serum alcohol is 10 mg/dL. For medical purposes only. Performed at [facility], [address] [place], [place] [address]    No results found.  Pending Labs Unresulted Labs (From admission, onward)    Start     Ordered   05/14/19 0500  Creatinine, serum  (enoxaparin (LOVENOX)    CrCl >/= 30 ml/min)  Weekly,   STAT    Comments: while on enoxaparin therapy    05/07/19 2328   05/08/19 1275  Basic metabolic panel  Tomorrow morning,   STAT     05/07/19 2328   05/08/19  0500  CBC  Tomorrow morning,   STAT     05/07/19 2328   05/07/19 2223  Novel Coronavirus,NAA,(SEND-OUT TO REF LAB - TAT 24-48 hrs); Hosp Order  (Asymptomatic Patients Labs)  Once,   STAT    Question:  Rule Out  Answer:  Yes   05/07/19 2222          Vitals/Pain Today's Vitals   05/07/19 2031 05/07/19 2300 05/07/19 2330 05/08/19 0056  BP: (!) 186/120   (!) 160/105  Pulse: (!) 111 (!) 101 (!) 101 92  Resp:  (!) 22 (!) 21 18  Temp:      TempSrc:      SpO2:  96% 99% 97%  Weight:      Height:      PainSc:        Isolation Precautions No active isolations  Medications Medications  LORazepam (ATIVAN) injection 2-3 mg (2 mg Intravenous Given 1/70/01 7494)  folic acid injection 1 mg (1 mg Intravenous Given 05/07/19 2026)  multivitamin with minerals tablet 1 tablet (1 tablet Oral Given 05/07/19 2027)  allopurinol (ZYLOPRIM) tablet 300 mg (has no administration in time range)  fluticasone furoate-vilanterol (BREO ELLIPTA) 100-25 MCG/INH 1 puff (has no administration in time range)  gabapentin (NEURONTIN) capsule 200 mg (has no administration in time range)  lisinopril (ZESTRIL) tablet 20 mg (has no administration in time range)  enoxaparin (LOVENOX) injection 40 mg (has no administration in time range)  sodium chloride flush (NS) 0.9 % injection 3 mL (has no administration in time range)  0.9 %  sodium chloride infusion ( Intravenous New Bag/Given 05/08/19 0057)  acetaminophen (TYLENOL) tablet 650 mg (has no administration in time range)    Or  acetaminophen (TYLENOL) suppository 650 mg (has no administration in time range)  polyethylene glycol (MIRALAX / GLYCOLAX) packet 17 g (has no administration in time range)  ondansetron (ZOFRAN) tablet 4 mg (has no administration in time range)    Or  ondansetron (ZOFRAN) injection 4 mg (has no administration in time range)  LORazepam (ATIVAN) tablet 1 mg (has no administration in time range)    Or  LORazepam (ATIVAN) injection 1 mg (has no  administration in time range)  folic acid (FOLVITE) tablet 1 mg (has no administration in time range)  thiamine (VITAMIN B-1) tablet 100 mg (has no administration in time range)    Or  thiamine (B-1) injection 100 mg (has no administration in time range)  LORazepam (ATIVAN) injection 2 mg (2 mg Intravenous Given 05/07/19 2022)  ondansetron (ZOFRAN) injection 4 mg (4 mg Intravenous Given 05/07/19 2020)  LORazepam (ATIVAN) injection 1 mg (1 mg Intravenous Given 05/07/19 2134)  0.9 %  sodium chloride infusion ( Intravenous Stopped 05/08/19 0000)    Mobility walks High fall risk   Focused Assessments Patient seeking help with detox   R Recommendations: See Admitting Provider Note  Report given to:   Additional Notes:

## 2019-05-08 NOTE — ED Notes (Signed)
Patient repositioned for comfort.  Informed patient he would be admitted and we were awaiting room assignment, verbalized understanding.

## 2019-05-08 NOTE — Progress Notes (Signed)
[name] to be D/C'd Home per MD order.  Discussed prescriptions and follow up appointments with the patient. Prescriptions given to patient, medication list explained in detail. Pt verbalized understanding.  Allergies as of 05/08/2019      Reactions   Percocet [oxycodone-acetaminophen] Nausea And Vomiting      Medication List    TAKE these medications   albuterol 108 (90 Base) MCG/ACT inhaler Commonly known as: Ventolin HFA Inhale 2 puffs into the lungs every 4 (four) hours as needed. What changed: reasons to take this   allopurinol 300 MG tablet Commonly known as: ZYLOPRIM Take 1 tablet (300 mg total) by mouth daily.   chlordiazePOXIDE 25 MG capsule Commonly known as: LIBRIUM Take 25mg  three times a day for 2 days, then take 25mg  twice a day for 2 days, then take 25mg  once a day for 2 days   colchicine 0.6 MG tablet Take 1 tablet (0.6 mg total) by mouth daily. What changed:   when to take this  reasons to take this   docusate sodium 100 MG capsule Commonly known as: Colace Take 1 tablet once or twice daily as needed for constipation while taking narcotic pain medicine   Fluticasone-Salmeterol 100-50 MCG/DOSE Aepb Commonly known as: Advair Diskus Inhale 1 puff into the lungs 2 (two) times daily.   folic acid 1 MG tablet Commonly known as: FOLVITE Take 1 tablet (1 mg total) by mouth daily.   gabapentin 100 MG capsule Commonly known as: NEURONTIN Take 2 capsules (200 mg total) by mouth 3 (three) times daily.   lisinopril 20 MG tablet Commonly known as: ZESTRIL Take 1 tablet (20 mg total) by mouth daily.   multivitamin with minerals tablet Take 1 tablet by mouth daily.   naltrexone 50 MG tablet Commonly known as: DEPADE Take 1 tablet (50 mg total) by mouth daily for 30 days.   tiZANidine 4 MG tablet Commonly known as: ZANAFLEX Take 0.5 tablets (2 mg total) by mouth every 8 (eight) hours as needed for muscle spasms.       Vitals:   05/08/19 0532  05/08/19 0750  BP: (!) 165/111 (!) 152/101  Pulse: 80 80  Resp:    Temp:    SpO2:      Skin clean, dry and intact without evidence of skin break down, no evidence of skin tears noted. IV catheter discontinued intact. Site without signs and symptoms of complications. Dressing and pressure applied. Pt denies pain at this time. No complaints noted.  An After Visit Summary was printed and given to the patient. Patient escorted via [place], and D/C home via private auto.  Fuller Mandril, RN

## 2019-05-08 NOTE — Discharge Instructions (Signed)
It was so nice to meet you during this hospitalization!  You came into the hospital with alcohol withdrawal. We gave you Ativan and IV fluids to help with your withdrawal symptoms.  I have prescribed the following medications: 1. Naltrexone- take 1 tablet once daily to decrease alcohol cravings 2. Librium- this medicine with help decrease alcohol withdrawal symptoms. Please take this medicine as follows: -Take 1 tablet (25mg ) three times daily for 2 days -Take 1 tablet (25mg ) twice daily for 2 days -Take 1 tablet (25mg ) once daily for 2 days  Take care, Dr. Brett Albino

## 2019-05-08 NOTE — Discharge Summary (Addendum)
[facility] NAME: Miguel [name]    MR#:  [id number]  DATE OF BIRTH:  06/05/1961  DATE OF ADMISSION:  05/07/2019   ADMITTING PHYSICIAN: Christel Mormon, MD  DATE OF DISCHARGE: 05/08/2019  1:35 PM  PRIMARY CARE PHYSICIAN: System, Pcp Not In   ADMISSION DIAGNOSIS:  Alcohol withdrawal syndrome with complication ([place]) [V40.086] DISCHARGE DIAGNOSIS:  Active Problems:   Alcohol withdrawal ([place])  SECONDARY DIAGNOSIS:   Past Medical History:  Diagnosis Date  . Alcohol abuse   . Asthma   . GERD (gastroesophageal reflux disease)   . Gout   . Hypertension   . Kidney stone   . OCD (obsessive compulsive disorder)   . Renal colic    HOSPITAL COURSE:   Miguel [name] is a 58 year old male who presented to the ED with anxiety and alcohol withdrawal.  He had not consumed any alcohol for the previous 15 weeks, but then began drinking again this week.  He has been consuming at least a pint of vodka per day.  His last drink was 2 days prior to arrival in the ED.  He endorsed anxiety and tremors.  He was admitted for further management.  Alcohol withdrawal-greatly improved.   -Withdrawal symptoms completely resolved, and patient requested discharge home. -Patient states that he would like to refrain from drinking alcohol -His outpatient naltrexone was refilled -Patient discharged home on a 6-day Librium taper -Patient would benefit from continued alcohol cessation counseling as an outpatient  Hypertension-BPs improved -Continued home lisinopril  Asthma-stable, no signs of acute exacerbation -Continued home inhalers  GERD-stable -Treated with PPI  Normocytic anemia- hemoglobin dropped from 13.0 to 11.6 on the day of discharge.  Likely due to dilution from IV fluids, as all cell lines dropped. -Recheck CBC as an outpatient  DISCHARGE CONDITIONS:  Alcohol abuse/withdrawal Hypertension Chronic asthma GERD CONSULTS OBTAINED:  None DRUG  ALLERGIES:   Allergies  Allergen Reactions  . Percocet [Oxycodone-Acetaminophen] Nausea And Vomiting   DISCHARGE MEDICATIONS:   Allergies as of 05/08/2019      Reactions   Percocet [oxycodone-acetaminophen] Nausea And Vomiting      Medication List    TAKE these medications   albuterol 108 (90 Base) MCG/ACT inhaler Commonly known as: Ventolin HFA Inhale 2 puffs into the lungs every 4 (four) hours as needed. What changed: reasons to take this   allopurinol 300 MG tablet Commonly known as: ZYLOPRIM Take 1 tablet (300 mg total) by mouth daily.   chlordiazePOXIDE 25 MG capsule Commonly known as: LIBRIUM Take 25mg  three times a day for 2 days, then take 25mg  twice a day for 2 days, then take 25mg  once a day for 2 days   colchicine 0.6 MG tablet Take 1 tablet (0.6 mg total) by mouth daily. What changed:   when to take this  reasons to take this   docusate sodium 100 MG capsule Commonly known as: Colace Take 1 tablet once or twice daily as needed for constipation while taking narcotic pain medicine   Fluticasone-Salmeterol 100-50 MCG/DOSE Aepb Commonly known as: Advair Diskus Inhale 1 puff into the lungs 2 (two) times daily.   folic acid 1 MG tablet Commonly known as: FOLVITE Take 1 tablet (1 mg total) by mouth daily.   gabapentin 100 MG capsule Commonly known as: NEURONTIN Take 2 capsules (200 mg total) by mouth 3 (three) times daily.   lisinopril 20 MG tablet Commonly known as: ZESTRIL Take 1 tablet (20 mg total)  by mouth daily.   multivitamin with minerals tablet Take 1 tablet by mouth daily.   naltrexone 50 MG tablet Commonly known as: DEPADE Take 1 tablet (50 mg total) by mouth daily for 30 days.   tiZANidine 4 MG tablet Commonly known as: ZANAFLEX Take 0.5 tablets (2 mg total) by mouth every 8 (eight) hours as needed for muscle spasms.        DISCHARGE INSTRUCTIONS:  1.  Follow-up with PCP in 5 days 2.  Take Librium taper as prescribed DIET:   Regular diet DISCHARGE CONDITION:  Stable ACTIVITY:  Activity as tolerated OXYGEN:  Home Oxygen: No.  Oxygen Delivery: room air DISCHARGE LOCATION:  home   If you experience worsening of your admission symptoms, develop shortness of breath, life threatening emergency, suicidal or homicidal thoughts you must seek medical attention immediately by calling 911 or calling your MD immediately  if symptoms less severe.  You Must read complete instructions/literature along with all the possible adverse reactions/side effects for all the Medicines you take and that have been prescribed to you. Take any new Medicines after you have completely understood and accpet all the possible adverse reactions/side effects.   Please note  You were cared for by a hospitalist during your hospital stay. If you have any questions about your discharge medications or the care you received while you were in the hospital after you are discharged, you can call the unit and asked to speak with the hospitalist on call if the hospitalist that took care of you is not available. Once you are discharged, your primary care physician will handle any further medical issues. Please note that NO REFILLS for any discharge medications will be authorized once you are discharged, as it is imperative that you return to your primary care physician (or establish a relationship with a primary care physician if you do not have one) for your aftercare needs so that they can reassess your need for medications and monitor your lab values.    On the day of Discharge:  VITAL SIGNS:  Blood pressure (!) 152/101, pulse 80, temperature 97.8 F (36.6 C), temperature source Oral, resp. rate 16, height 6' (1.829 m), weight 83.9 kg, SpO2 100 %. PHYSICAL EXAMINATION:  GENERAL:  58 y.o.-year-old patient lying in the bed with no acute distress.  EYES: Pupils equal, round, reactive to light and accommodation. No scleral icterus. Extraocular muscles  intact.  HEENT: Head atraumatic, normocephalic. Oropharynx and nasopharynx clear.  NECK:  Supple, no jugular venous distention. No thyroid enlargement, no tenderness.  LUNGS: Normal breath sounds bilaterally, no wheezing, rales,rhonchi or crepitation. No use of accessory muscles of respiration.  CARDIOVASCULAR: S1, S2 normal. No murmurs, rubs, or gallops.  ABDOMEN: Soft, non-tender, non-distended. Bowel sounds present. No organomegaly or mass.  EXTREMITIES: No pedal edema, cyanosis, or clubbing.  NEUROLOGIC: Cranial nerves II through XII are intact. Muscle strength 5/5 in all extremities. Sensation intact. Gait not checked.  No tremors. PSYCHIATRIC: The patient is alert and oriented x 3.  SKIN: No obvious rash, lesion, or ulcer.  DATA REVIEW:   CBC Recent Labs  Lab 05/08/19 0523  WBC 6.6  HGB 11.6*  HCT 37.2*  PLT 209    Chemistries  Recent Labs  Lab 05/07/19 2005 05/08/19 0523  NA 141 141  K 3.5 3.3*  CL 107 110  CO2 19* 24  GLUCOSE 152* 98  BUN 17 15  CREATININE 1.04 0.88  CALCIUM 9.2 8.6*  AST 15  --   ALT  15  --   ALKPHOS 71  --   BILITOT 2.0*  --      Microbiology Results  Results for orders placed or performed during the hospital encounter of 05/07/19  SARS Coronavirus 2 [facility] order, Performed in [facility] hospital lab)     Status: None   Collection Time: 05/07/19 11:46 PM  Result Value Ref Range Status   SARS Coronavirus 2 NEGATIVE NEGATIVE Final    Comment: (NOTE) If result is NEGATIVE SARS-CoV-2 target nucleic acids are NOT DETECTED. The SARS-CoV-2 RNA is generally detectable in upper and lower  respiratory specimens during the acute phase of infection. The lowest  concentration of SARS-CoV-2 viral copies this assay can detect is 250  copies / mL. A negative result does not preclude SARS-CoV-2 infection  and should not be used as the sole basis for treatment or other  patient management decisions.  A negative result may occur with  improper  specimen collection / handling, submission of specimen other  than nasopharyngeal swab, presence of viral mutation(s) within the  areas targeted by this assay, and inadequate number of viral copies  (<250 copies / mL). A negative result must be combined with clinical  observations, patient history, and epidemiological information. If result is POSITIVE SARS-CoV-2 target nucleic acids are DETECTED. The SARS-CoV-2 RNA is generally detectable in upper and lower  respiratory specimens dur ing the acute phase of infection.  Positive  results are indicative of active infection with SARS-CoV-2.  Clinical  correlation with patient history and other diagnostic information is  necessary to determine patient infection status.  Positive results do  not rule out bacterial infection or co-infection with other viruses. If result is PRESUMPTIVE POSTIVE SARS-CoV-2 nucleic acids MAY BE PRESENT.   A presumptive positive result was obtained on the submitted specimen  and confirmed on repeat testing.  While 2019 novel coronavirus  (SARS-CoV-2) nucleic acids may be present in the submitted sample  additional confirmatory testing may be necessary for epidemiological  and / or clinical management purposes  to differentiate between  SARS-CoV-2 and other Sarbecovirus currently known to infect humans.  If clinically indicated additional testing with an alternate test  methodology [id number]) is advised. The SARS-CoV-2 RNA is generally  detectable in upper and lower respiratory sp ecimens during the acute  phase of infection. The expected result is Negative. Fact Sheet for Patients:  StrictlyIdeas.no Fact Sheet for Healthcare Providers: BankingDealers.co.za This test is not yet approved or cleared by the [place] FDA and has been authorized for detection and/or diagnosis of SARS-CoV-2 by FDA under an Emergency Use Authorization (EUA).  This EUA will remain in  effect (meaning this test can be used) for the duration of the COVID-19 declaration under Section 564(b)(1) of the Act, 21 U.S.C. section 360bbb-3(b)(1), unless the authorization is terminated or revoked sooner. Performed at [facility], [address], [place], [place] [address]     RADIOLOGY:  No results found.   Management plans discussed with the patient, family and they are in agreement.  CODE STATUS: Full Code   TOTAL TIME TAKING CARE OF THIS PATIENT: 40 minutes.    Berna Spare Mayo M.D on 05/08/2019 at 2:05 PM  Between 7am to 6pm - Pager [phone]  After 6pm go to [email or web address] - Proofreader  [facility]  Office  [phone]  CC: Primary care physician; System, Pcp Not In   Note: This dictation was prepared with Dragon dictation along with smaller phrase  technology. Any transcriptional errors that result from this process are unintentional.

## 2019-05-08 NOTE — TOC Transition Note (Signed)
Transition of Care [facility]) - CM/SW Discharge Note   Patient Details  Name: Miguel [name] MRN: [id number] Date of Birth: [birth year]  Transition of Care [facility]) CM/SW Contact:  Beverly Sessions, RN Phone Number: 05/08/2019, 10:42 AM   Clinical Narrative:    Patient admitted with alcohol withdrawal.   Patient lives at home with wife  PCP [facility] .  Follow up scheduled for 05/09/19 Patient obtains medications from Medication management.  Patient to discharge on Librium.  Medication Management can not provide Librium. Patient states that he will be getting it from [facility].  Goodrx coupon provided $7.15.  Patient confirms he will be able to obtain medication.   Patient denies any issues with transportation.  Has working care.  Will be calling himself a taxi for the ride home.    Final next level of care: Home/Self Care Barriers to Discharge: Barriers Resolved   Patient Goals and CMS Choice        Discharge Placement                       Discharge Plan and Services                                     Social Determinants of Health (SDOH) Interventions     Readmission Risk Interventions Readmission Risk Prevention Plan 05/08/2019 03/09/2019  Transportation Screening Complete Complete  Medication Review Press photographer) Complete Complete  PCP or Specialist appointment within 3-5 days of discharge Complete Complete  HRI or [place] (No Data) Not Complete  HRI or Home Care Consult Pt Refusal Comments - not indicated   SW Recovery Care/Counseling Consult - Not Complete  SW Consult Not Complete Comments - not indicated   Palliative Care Screening Not Applicable Not [facility] Not Applicable Not Applicable  Some recent data might be hidden

## 2019-05-08 NOTE — Progress Notes (Signed)
Called cab service for patient.   Fuller Mandril, RN

## 2019-05-09 ENCOUNTER — Encounter: Payer: Self-pay | Admitting: Radiology

## 2019-05-09 ENCOUNTER — Ambulatory Visit: Payer: No Typology Code available for payment source | Admitting: Licensed Clinical Social Worker

## 2019-05-09 ENCOUNTER — Emergency Department: Payer: Self-pay

## 2019-05-09 ENCOUNTER — Other Ambulatory Visit: Payer: Self-pay

## 2019-05-09 ENCOUNTER — Emergency Department
Admission: EM | Admit: 2019-05-09 | Discharge: 2019-05-09 | Disposition: A | Discharge status: Home / Self Care | Payer: Self-pay | Attending: Emergency Medicine | Admitting: Emergency Medicine

## 2019-05-09 DIAGNOSIS — I10 Essential (primary) hypertension: Secondary | ICD-10-CM | POA: Insufficient documentation

## 2019-05-09 DIAGNOSIS — W19XXXA Unspecified fall, initial encounter: Secondary | ICD-10-CM | POA: Insufficient documentation

## 2019-05-09 DIAGNOSIS — S50812A Abrasion of left forearm, initial encounter: Secondary | ICD-10-CM | POA: Insufficient documentation

## 2019-05-09 DIAGNOSIS — S301XXA Contusion of abdominal wall, initial encounter: Secondary | ICD-10-CM | POA: Insufficient documentation

## 2019-05-09 DIAGNOSIS — R531 Weakness: Secondary | ICD-10-CM | POA: Insufficient documentation

## 2019-05-09 DIAGNOSIS — Y998 Other external cause status: Secondary | ICD-10-CM | POA: Insufficient documentation

## 2019-05-09 DIAGNOSIS — F1020 Alcohol dependence, uncomplicated: Secondary | ICD-10-CM

## 2019-05-09 DIAGNOSIS — Y9389 Activity, other specified: Secondary | ICD-10-CM | POA: Insufficient documentation

## 2019-05-09 DIAGNOSIS — Y929 Unspecified place or not applicable: Secondary | ICD-10-CM | POA: Insufficient documentation

## 2019-05-09 DIAGNOSIS — Z79899 Other long term (current) drug therapy: Secondary | ICD-10-CM | POA: Insufficient documentation

## 2019-05-09 DIAGNOSIS — Z87891 Personal history of nicotine dependence: Secondary | ICD-10-CM | POA: Insufficient documentation

## 2019-05-09 DIAGNOSIS — J45909 Unspecified asthma, uncomplicated: Secondary | ICD-10-CM | POA: Insufficient documentation

## 2019-05-09 MED ORDER — TRAMADOL HCL 50 MG PO TABS
50.0000 mg | ORAL_TABLET | Freq: Once | ORAL | Status: AC
  Administered 2019-05-09: 50 mg via ORAL
  Filled 2019-05-09: qty 1

## 2019-05-09 MED ORDER — IOHEXOL 300 MG/ML  SOLN
100.0000 mL | Freq: Once | INTRAMUSCULAR | Status: AC | PRN
  Administered 2019-05-09: 100 mL via INTRAVENOUS
  Filled 2019-05-09: qty 100

## 2019-05-09 NOTE — ED Triage Notes (Addendum)
Pt in with co abrasion to left forearm states fell today. Was started on librium and gabapentin recently and states meds make his legs weak.

## 2019-05-09 NOTE — BH Specialist Note (Signed)
Integrated Behavioral Health Follow Up Visit Via Phone  MRN: [id number] Name: Miguel [name]  Number of [place] Clinician visits: 5/6  Type of Service: [place] Interpretor:No. Interpretor Name and Language: Not applicable.  SUBJECTIVE: Miguel [name] is a 58 y.o. male accompanied by himself. Patient was referred by Carlyon Shadow NP for mental health. Patient reports the following symptoms/concerns: He admits to relapsing on 04/21/2019 due to getting into an argument with his wife. He notes that he didn't stay at [facility] on 04/21/2019 and ended up leaving. He notes that the reason he went to the hospital on June 21st was due to withdrawal and felt awful. He explains that he is taking the Naltrexone daily. He expresses a desire to not drink anymore. He explains that the problem is when he doesn't have a lot to occupy his time and starts to get a little stressed about things, he has the desire to want to drink.  Duration of problem: ; Severity of problem: MILD.  OBJECTIVE: Mood: Euthymic and Affect: Appropriate Risk of harm to self or others: No plan to harm self or others  LIFE CONTEXT: Family and Social: See above. School/Work: See above. Self-Care: See above. Life Changes: see above.  GOALS ADDRESSED: Patient will: 1.  Reduce symptoms of: alcohol abuse  2.  Increase knowledge and/or ability of: healthy habits and self-management skills  3.  Demonstrate ability to: Increase healthy adjustment to current life circumstances  INTERVENTIONS: Interventions utilized:  Motivational Interviewing was utilized by the clinician during today's session. Clinician processed with the patient regarding how she noticed in his chart that he has relapsed twice once on 04/21/2019 and the second on 05/04/2019. Clinician discussed with the patient his triggers to drink the last two times. Clinician asked the patient what the argument with his wife was over.  Clinician asked the patient if he has been taking the Naltrexone. Clinician explained to the patient that its very important that he take his Naltrexone every day or he will relapse again. Clinician explained to the patient that it sounds like he needs a routine for the morning, afternoon, and evening. Severity measures were not completed at today's session due to patient not answering the phone till 12 after for a half hour session. Standardized Assessments completed: next session.  ASSESSMENT: Patient currently experiencing see above.   Patient may benefit from see above.  PLAN: 1. Follow up with behavioral health clinician on : Follow up in one week or earlier if needed. 2. Behavioral recommendations: See above. 3. Referral(s): [place] (In Clinic) 4. "From scale of 1-10, how likely are you to follow plan?":   Bayard Hugger, LCSW

## 2019-05-09 NOTE — ED Provider Notes (Signed)
[facility] Emergency Department Provider Note   ____________________________________________   First MD Initiated Contact with Patient 05/09/19 2112     (approximate)  I have reviewed the triage vital signs and the nursing notes.   HISTORY  Chief Complaint Fall    HPI TIGE MEAS is a 58 y.o. male patient presents with with abrasion to left forearm secondary to a fall.  Patient state he is started Librium and gabapentin makes his legs weak.  Patient denies LOC or head injury.  Patient also complaining of abdominal pain secondary to fall.  Patient rates his pain is 8/10.  Patient describes his pain as "sore".  No palliative measure for complaint.         Past Medical History:  Diagnosis Date  . Alcohol abuse   . Asthma   . GERD (gastroesophageal reflux disease)   . Gout   . Hypertension   . Kidney stone   . OCD (obsessive compulsive disorder)   . Renal colic     Patient Active Problem List   Diagnosis Date Noted  . Alcohol withdrawal ([place]) 05/07/2019  . Alcoholic intoxication with complication ([place])   . Sepsis ([place]) 03/08/2019  . Breast lump or mass 10/04/2018  . Sepsis secondary to UTI ([place]) 09/14/2018  . Asthma 07/07/2018  . GERD (gastroesophageal reflux disease) 07/07/2018  . OCD (obsessive compulsive disorder) 07/07/2018  . Self-inflicted laceration of wrist 03/10/2018  . Leg hematoma 12/25/2017  . Suicide and self-inflicted injury by cutting and piercing instrument ([place]) 03/10/2017  . Severe recurrent major 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 ([place]) 03/09/2017  . Substance induced mood disorder ([place]) 08/15/2016  . Involuntary commitment 08/15/2016  . Alcohol use disorder, severe, dependence ([place]) 02/05/2016  . Hypertension 12/05/2015  . Tachycardia 12/05/2015  . Gout 11/13/2015  . Chronic back pain 05/02/2015    Past Surgical History:  Procedure Laterality Date  . CHOLECYSTECTOMY  2012  . EXTRACORPOREAL SHOCK WAVE  LITHOTRIPSY Left 01/12/2019   Procedure: EXTRACORPOREAL SHOCK WAVE LITHOTRIPSY (ESWL);  Surgeon: Billey Co, MD;  Location: [facility] ORS;  Service: Urology;  Laterality: Left;    Prior to Admission medications   Medication Sig Start Date End Date Taking? Authorizing Provider  albuterol (VENTOLIN HFA) 108 (90 Base) MCG/ACT inhaler Inhale 2 puffs into the lungs every 4 (four) hours as needed. Patient taking differently: Inhale 2 puffs into the lungs every 4 (four) hours as needed for wheezing or shortness of breath.  09/01/18   Tukov-Yual, Arlyss Gandy, NP  allopurinol (ZYLOPRIM) 300 MG tablet Take 1 tablet (300 mg total) by mouth daily. 09/01/18   Tukov-Yual, Arlyss Gandy, NP  chlordiazePOXIDE (LIBRIUM) 25 MG capsule Take 25mg  three times a day for 2 days, then take 25mg  twice a day for 2 days, then take 25mg  once a day for 2 days 05/08/19   Mayo, Pete Pelt, MD  colchicine 0.6 MG tablet Take 1 tablet (0.6 mg total) by mouth daily. Patient taking differently: Take 0.6 mg by mouth daily as needed.  03/09/19   Lang Snow, NP  docusate sodium (COLACE) 100 MG capsule Take 1 tablet once or twice daily as needed for constipation while taking narcotic pain medicine 01/09/19   Hinda Kehr, MD  Fluticasone-Salmeterol (ADVAIR DISKUS) 100-50 MCG/DOSE AEPB Inhale 1 puff into the lungs 2 (two) times daily. 09/01/18   Tukov-Yual, Arlyss Gandy, NP  folic acid (FOLVITE) 1 MG tablet Take 1 tablet (1 mg total) by mouth daily. 09/01/18   Maura Crandall  S, NP  gabapentin (NEURONTIN) 100 MG capsule Take 2 capsules (200 mg total) by mouth 3 (three) times daily. 04/21/19   Patrecia Pour, NP  lisinopril (PRINIVIL,ZESTRIL) 20 MG tablet Take 1 tablet (20 mg total) by mouth daily. 02/21/19   Iloabachie, Chioma E, NP  Multiple Vitamins-Minerals (MULTIVITAMIN WITH MINERALS) tablet Take 1 tablet by mouth daily. 09/01/18   Tukov-Yual, Arlyss Gandy, NP  naltrexone (DEPADE) 50 MG tablet Take 1 tablet (50 mg total) by  mouth daily for 30 days. 05/08/19 06/07/19  Mayo, Pete Pelt, MD  tiZANidine (ZANAFLEX) 4 MG tablet Take 0.5 tablets (2 mg total) by mouth every 8 (eight) hours as needed for muscle spasms. 04/26/19   Iloabachie, Chioma E, NP    Allergies Percocet [oxycodone-acetaminophen]  Family History  Problem Relation Age of Onset  . Alcohol abuse Father   . Breast cancer Mother 71    Social History Social History   Tobacco Use  . Smoking status: Former Research scientist (life sciences)  . Smokeless tobacco: Never Used  . Tobacco comment: quit 30 years ago  Substance Use Topics  . Alcohol use: Yes    Alcohol/week: 2.0 standard drinks    Types: 2 Glasses of wine per week    Comment: with lunch  . Drug use: No    Review of Systems  Constitutional: No fever/chills Eyes: No visual changes. ENT: No sore throat. Cardiovascular: Denies chest pain. Respiratory: Denies shortness of breath. Gastrointestinal: Right abdominal pain..  No nausea, no vomiting.  No diarrhea.  No constipation. Genitourinary: Negative for dysuria. Musculoskeletal: Negative for back pain. Skin: Negative for rash.  Abdominal bruising. Neurological: Negative for headaches, focal weakness or numbness. Psychiatric:  Alcohol abuse Endocrine:  Hypertension. Allergic/Immunilogical: Percocet.  ____________________________________________   PHYSICAL EXAM:  VITAL SIGNS: ED Triage Vitals  Enc Vitals Group     BP 05/09/19 2118 102/60     Pulse Rate 05/09/19 2118 (!) 104     Resp 05/09/19 2118 20     Temp 05/09/19 2118 98.4 F (36.9 C)     Temp Source 05/09/19 2118 Oral     SpO2 05/09/19 2118 96 %     Weight 05/09/19 2119 185 lb (83.9 kg)     Height 05/09/19 2119 6' (1.829 m)     Head Circumference --      Peak Flow --      Pain Score 05/09/19 2118 8     Pain Loc --      Pain Edu? --      Excl. in [place]? --     Constitutional: Alert and oriented. Well appearing and in no acute distress. Eyes: Conjunctivae are normal. PERRL. EOMI. Head:  Atraumatic. Cardiovascular: Normal rate, regular rhythm. Grossly normal heart sounds.  Good peripheral circulation. Respiratory: Normal respiratory effort.  No retractions. Lungs CTAB. Gastrointestinal: Soft and nontender. No distention. No abdominal bruits. No CVA tenderness. Genitourinary: Deferred Musculoskeletal: No lower extremity tenderness nor edema.  No joint effusions. Neurologic:  Normal speech and language. No gross focal neurologic deficits are appreciated. No gait instability. Skin:  Skin is warm, dry and intact. No rash noted.  Ecchymosis right lower abdomen.  Abrasion left forearm. Psychiatric: Mood and affect are normal. Speech and behavior are normal.  ____________________________________________   LABS (all labs ordered are listed, but only abnormal results are displayed)  Labs Reviewed - No data to display ____________________________________________  EKG   ____________________________________________  RADIOLOGY  ED MD interpretation:    Official radiology report(s): Ct Abdomen Pelvis W  Contrast  Result Date: 05/09/2019 CLINICAL DATA:  Fall with abdominal trauma EXAM: CT ABDOMEN AND PELVIS WITH CONTRAST TECHNIQUE: Multidetector CT imaging of the abdomen and pelvis was performed using the standard protocol following bolus administration of intravenous contrast. CONTRAST:  183mL OMNIPAQUE IOHEXOL 300 MG/ML  SOLN COMPARISON:  CT abdomen pelvis 03/08/2019 FINDINGS: LOWER CHEST: There is no basilar pleural or apical pericardial effusion. HEPATOBILIARY: The hepatic contours and density are normal. There is no intra- or extrahepatic biliary dilatation. The gallbladder is normal. PANCREAS: The pancreatic parenchymal contours are normal and there is no ductal dilatation. There is no peripancreatic fluid collection. SPLEEN: Normal. ADRENALS/URINARY TRACT: --Adrenal glands: Normal. --Right kidney/ureter: Multiple nonobstructing renal calculi, measuring up to 5 mm. No  hydronephrosis, perinephric stranding or solid renal mass. --Left kidney/ureter: Multiple nonobstructing renal calculi, measuring up to mm. No hydronephrosis, perinephric stranding or solid renal mass. --Urinary bladder: Normal for degree of distention STOMACH/BOWEL: --Stomach/Duodenum: There is no hiatal hernia or other gastric abnormality. The duodenal course and caliber are normal. --Small bowel: No dilatation or inflammation. --Colon: No focal abnormality. --Appendix: Normal. VASCULAR/LYMPHATIC: Normal course and caliber of the major abdominal vessels. No abdominal or pelvic lymphadenopathy. REPRODUCTIVE: Enlarged prostate measures 5.1 cm in transverse dimension. MUSCULOSKELETAL. No bony spinal canal stenosis or focal osseous abnormality. Healing posterior right twelfth rib fracture. OTHER: None. IMPRESSION: 1. No acute abnormality of the abdomen or pelvis. 2. Bilateral nonobstructive nephrolithiasis. 3. Healing fracture of the posterior right twelfth rib. Electronically Signed   By: Ulyses Jarred M.D.   On: 05/09/2019 23:13    ____________________________________________   PROCEDURES  Procedure(s) performed (including Critical Care):  Procedures   ____________________________________________   INITIAL IMPRESSION / ASSESSMENT AND PLAN / ED COURSE  As part of my medical decision making, I reviewed the following data within the [place]         Patient presents abrasion to left forearm secondary to fall.  Patient also relate to right lower stomach pain secondary to contusion during his fall.  Physical exam is remarkable for guarding and ecchymosis right lower abdominal area.  Patient be further evaluated with abdominal CT.   Discussed CT findings with patient with no acute findings.  Patient has bilateral nonobstructed nephrolithiasis.  Patient given discharge care instruction advised supportive care and follow-up with PCP.    ____________________________________________   FINAL CLINICAL IMPRESSION(S) / ED DIAGNOSES  Final diagnoses:  Abrasion of left forearm, initial encounter  Contusion of abdominal wall, initial encounter     ED Discharge Orders    None       Note:  This document was prepared using Dragon voice recognition software and may include unintentional dictation errors.    Sable Feil, PA-C 05/09/19 2321    Earleen Newport, MD 05/09/19 [phone]

## 2019-05-09 NOTE — ED Notes (Signed)
Patient transported to CT 

## 2019-05-10 ENCOUNTER — Other Ambulatory Visit: Payer: Self-pay

## 2019-05-10 ENCOUNTER — Emergency Department
Admission: EM | Admit: 2019-05-10 | Discharge: 2019-05-11 | Disposition: A | Discharge status: Home / Self Care | Payer: Medicaid Other | Attending: Emergency Medicine | Admitting: Emergency Medicine

## 2019-05-10 ENCOUNTER — Encounter: Payer: Self-pay | Admitting: *Deleted

## 2019-05-10 DIAGNOSIS — F329 Major depressive disorder, single episode, unspecified: Secondary | ICD-10-CM | POA: Insufficient documentation

## 2019-05-10 DIAGNOSIS — Z87891 Personal history of nicotine dependence: Secondary | ICD-10-CM | POA: Insufficient documentation

## 2019-05-10 DIAGNOSIS — Z79899 Other long term (current) drug therapy: Secondary | ICD-10-CM | POA: Insufficient documentation

## 2019-05-10 DIAGNOSIS — Y907 Blood alcohol level of 200-239 mg/100 ml: Secondary | ICD-10-CM | POA: Insufficient documentation

## 2019-05-10 DIAGNOSIS — I10 Essential (primary) hypertension: Secondary | ICD-10-CM | POA: Insufficient documentation

## 2019-05-10 DIAGNOSIS — R45851 Suicidal ideations: Secondary | ICD-10-CM | POA: Insufficient documentation

## 2019-05-10 DIAGNOSIS — F1010 Alcohol abuse, uncomplicated: Secondary | ICD-10-CM

## 2019-05-10 LAB — COMPREHENSIVE METABOLIC PANEL
ALT: 12 U/L (ref 0–44)
AST: 11 U/L — ABNORMAL LOW (ref 15–41)
Albumin: 4.2 g/dL (ref 3.5–5.0)
Alkaline Phosphatase: 59 U/L (ref 38–126)
Anion gap: 10 (ref 5–15)
BUN: 20 mg/dL (ref 6–20)
CO2: 21 mmol/L — ABNORMAL LOW (ref 22–32)
Calcium: 9.1 mg/dL (ref 8.9–10.3)
Chloride: 110 mmol/L (ref 98–111)
Creatinine, Ser: 1.13 mg/dL (ref 0.61–1.24)
GFR calc Af Amer: >60 mL/min (ref >60)
GFR calc non Af Amer: >60 mL/min (ref >60)
Glucose, Bld: 112 mg/dL — ABNORMAL HIGH (ref 70–99)
Potassium: 3.7 mmol/L (ref 3.5–5.1)
Sodium: 141 mmol/L (ref 135–145)
Total Bilirubin: 0.6 mg/dL (ref 0.3–1.2)
Total Protein: 6.8 g/dL (ref 6.5–8.1)

## 2019-05-10 LAB — CBC WITH DIFFERENTIAL/PLATELET
Abs Immature Granulocytes: 0.01 10*3/uL (ref 0.00–0.07)
Basophils Absolute: 0 10*3/uL (ref 0.0–0.1)
Basophils Relative: 1 %
Eosinophils Absolute: 0.1 10*3/uL (ref 0.0–0.5)
Eosinophils Relative: 2 %
HCT: 37.2 % — ABNORMAL LOW (ref 39.0–52.0)
Hemoglobin: 11.4 g/dL — ABNORMAL LOW (ref 13.0–17.0)
Immature Granulocytes: 0 %
Lymphocytes Relative: 22 %
Lymphs Abs: 1.2 10*3/uL (ref 0.7–4.0)
MCH: 26.8 pg (ref 26.0–34.0)
MCHC: 30.6 g/dL (ref 30.0–36.0)
MCV: 87.5 fL (ref 80.0–100.0)
Monocytes Absolute: 0.6 10*3/uL (ref 0.1–1.0)
Monocytes Relative: 10 %
Neutro Abs: 3.7 10*3/uL (ref 1.7–7.7)
Neutrophils Relative %: 65 %
Platelets: 225 10*3/uL (ref 150–400)
RBC: 4.25 MIL/uL (ref 4.22–5.81)
RDW: 18.3 % — ABNORMAL HIGH (ref 11.5–15.5)
WBC: 5.7 10*3/uL (ref 4.0–10.5)
nRBC: 0 % (ref 0.0–0.2)

## 2019-05-10 LAB — ETHANOL: Alcohol, Ethyl (B): 230 mg/dL — ABNORMAL HIGH (ref <10)

## 2019-05-10 LAB — SALICYLATE LEVEL: Salicylate Lvl: <7 mg/dL (ref 2.8–30.0)

## 2019-05-10 LAB — LIPASE, BLOOD: Lipase: 33 U/L (ref 11–51)

## 2019-05-10 LAB — ACETAMINOPHEN LEVEL: Acetaminophen (Tylenol), Serum: <10 ug/mL — ABNORMAL LOW (ref 10–30)

## 2019-05-10 MED ORDER — LORAZEPAM 1 MG PO TABS
1.0000 mg | ORAL_TABLET | Freq: Once | ORAL | Status: AC
  Administered 2019-05-10: 1 mg via ORAL
  Filled 2019-05-10: qty 1

## 2019-05-10 NOTE — BH Assessment (Signed)
Assessment Note  Miguel [name] is an 58 y.o. male. Miguel [name] arrived to the ED by way of EMS.  He shared that he was self harming and cut himself on the arm.  He states "I was a little upset", "My wife is mad at me. I could not get her catheter open and she called her nurse, who did not want to come over today, and she argued that I should be able to do this. I got mad and I left. She called the police on me. My wife is upset and I am an upset".  He denied current symptoms of depression. "I am happy". He reports that earlier today he was feeling depressed.  "We are struggle, she just had cancer, she has MS. I just can't seem to do enough.  I do the laundry, I get her food, and they are worried about me drinking.   He denied symptoms of anxiety.  He denied having auditory or visual hallucinations.  He denied suicidal ideation or intent.  He denied homicidal ideation or intent. He states that he has not had any alcohol in 3 days.  He reports having financial stressors at this time.    Diagnosis: Depression, Alcohol Abuse Past Medical History:  Past Medical History:  Diagnosis Date  . Alcohol abuse   . Asthma   . GERD (gastroesophageal reflux disease)   . Gout   . Hypertension   . Kidney stone   . OCD (obsessive compulsive disorder)   . Renal colic     Past Surgical History:  Procedure Laterality Date  . CHOLECYSTECTOMY  2012  . EXTRACORPOREAL SHOCK WAVE LITHOTRIPSY Left 01/12/2019   Procedure: EXTRACORPOREAL SHOCK WAVE LITHOTRIPSY (ESWL);  Surgeon: Billey Co, MD;  Location: [facility] ORS;  Service: Urology;  Laterality: Left;    Family History:  Family History  Problem Relation Age of Onset  . Alcohol abuse Father   . Breast cancer Mother 85    Social History:  reports that he has quit smoking. He has never used smokeless tobacco. He reports current alcohol use of about 2.0 standard drinks of alcohol per week. He reports that he does not use drugs.  Additional Social History:  Alcohol  / Drug Use History of alcohol / drug use?: Yes Substance #1 Name of Substance 1: Alcohol 1 - Age of First Use: 14 1 - Amount (size/oz): Varies 1 - Frequency: daily 1 - Last Use / Amount: 05/07/2019  CIWA:   COWS:    Allergies:  Allergies  Allergen Reactions  . Percocet [Oxycodone-Acetaminophen] Nausea And Vomiting    Home Medications: (Not in a hospital admission)   OB/GYN Status:  No LMP for male patient.  General Assessment Data Location of Assessment: [facility] ED TTS Assessment: In system Is this a Tele or Face-to-Face Assessment?: Face-to-Face Is this an Initial Assessment or a Re-assessment for this encounter?: Initial Assessment Patient Accompanied by:: N/A Language Other than English: No Living Arrangements: Other (Comment)(Private residence) What gender do you identify as?: Male Marital status: Married Living Arrangements: Spouse/significant other Can pt return to current living arrangement?: Yes Admission Status: Voluntary Is patient capable of signing voluntary admission?: Yes Referral Source: Self/Family/Friend Insurance type: None  Medical Screening Exam ([place]) Medical Exam completed: Yes  Crisis Care Plan Living Arrangements: Spouse/significant other Legal Guardian: Other:(Self) Name of Psychiatrist: None Name of Therapist: [facility]  Education Status Is patient currently in school?: No Is the patient employed, unemployed or receiving  disability?: Unemployed  Risk to self with the past 6 months Suicidal Ideation: No Has patient been a risk to self within the past 6 months prior to admission? : No Suicidal Intent: No Has patient had any suicidal intent within the past 6 months prior to admission? : No Is patient at risk for suicide?: No Suicidal Plan?: No Has patient had any suicidal plan within the past 6 months prior to admission? : No Access to Means: No What has been your use of drugs/alcohol within the last 12  months?: History of alcohol abuse Previous Attempts/Gestures: No How many times?: 0 Other Self Harm Risks: cuts himself, bruises himself, punches himself Triggers for Past Attempts: None known Intentional Self Injurious Behavior: Cutting, Bruising, Damaging Comment - Self Injurious Behavior: by self report Family Suicide History: No Recent stressful life event(s): Financial Problems, Other (Comment)(Wife's health concerns) Persecutory voices/beliefs?: No Depression: Yes Depression Symptoms: Feeling worthless/self pity Substance abuse history and/or treatment for substance abuse?: Yes Suicide prevention information given to non-admitted patients: Not applicable  Risk to Others within the past 6 months Homicidal Ideation: No Does patient have any lifetime risk of violence toward others beyond the six months prior to admission? : No Thoughts of Harm to Others: No Current Homicidal Intent: No Current Homicidal Plan: No Access to Homicidal Means: No Identified Victim: None Identified History of harm to others?: No Assessment of Violence: None Noted Does patient have access to weapons?: No Criminal Charges Pending?: No Does patient have a court date: No Is patient on probation?: No  Psychosis Hallucinations: None noted Delusions: None noted  Mental Status Report Appearance/Hygiene: Unremarkable Eye Contact: Good Motor Activity: Unremarkable Speech: Logical/coherent Level of Consciousness: Alert Mood: Pleasant Affect: Appropriate to circumstance Anxiety Level: None Judgement: Partial Orientation: Appropriate for developmental age Obsessive Compulsive Thoughts/Behaviors: None  Cognitive Functioning Concentration: Fair Memory: Recent Intact Is patient IDD: No Insight: Fair Impulse Control: Poor Appetite: Good Have you had any weight changes? : No Change Sleep: Decreased  ADLScreening [facility] Assessment Services) Patient's cognitive ability adequate to safely complete  daily activities?: Yes Patient able to express need for assistance with ADLs?: Yes Independently performs ADLs?: Yes (appropriate for developmental age)  Prior Inpatient Therapy Prior Inpatient Therapy: No  Prior Outpatient Therapy Prior Outpatient Therapy: Yes Prior Therapy Dates: Current Prior Therapy Facilty/Provider(s): [facility] Reason for Treatment: Depression, Alcohol Does patient have an ACCT team?: No Does patient have Intensive In-House Services?  : No Does patient have [facility] services? : No Does patient have P4CC services?: No  ADL Screening (condition at time of admission) Patient's cognitive ability adequate to safely complete daily activities?: Yes Is the patient deaf or have difficulty hearing?: No Does the patient have difficulty seeing, even when wearing glasses/contacts?: No Does the patient have difficulty concentrating, remembering, or making decisions?: No Patient able to express need for assistance with ADLs?: Yes Does the patient have difficulty dressing or bathing?: No Independently performs ADLs?: Yes (appropriate for developmental age) Does the patient have difficulty walking or climbing stairs?: No Weakness of Legs: None Weakness of Arms/Hands: None  Home Assistive Devices/Equipment Home Assistive Devices/Equipment: None    Abuse/Neglect Assessment (Assessment to be complete while patient is alone) Physical Abuse: Denies Verbal Abuse: Denies Sexual Abuse: Denies Exploitation of patient/patient's resources: Denies     Regulatory affairs officer (For Healthcare) Does Patient Have a Medical Advance Directive?: No Would patient like information on creating a medical advance directive?: No - Patient declined  Disposition:  Disposition Initial Assessment Completed for this Encounter: Yes  On Site Evaluation by:   Reviewed with Physician:    Elmer Bales 05/10/2019 8:57 PM

## 2019-05-10 NOTE — ED Notes (Signed)
Called Marvia Pickles, NP at [facility], stated he was getting set up and would be ready to interview patient shortly.

## 2019-05-10 NOTE — ED Notes (Signed)
Pt. Moved to interview room to talk to TTS via Video conference.

## 2019-05-10 NOTE — ED Provider Notes (Signed)
[facility] Emergency Department Provider Note  ____________________________________________  Time seen: Approximately 5:53 PM  I have reviewed the triage vital signs and the nursing notes.   HISTORY  Chief Complaint Suicidal ideation   HPI Miguel [name] is a 58 y.o. male with a history of alcohol abuse, hypertension, self-injurious behavior who comes the ED complaining of anxiety and a wish to die centered around feeling very stressed about his wife's multiple sclerosis and his inability to care for her in a way that he believes is adequate.  He has been cutting his left forearm over the last 2 days.  No other active plans for self-harm.  No HI or hallucinations.  Denies drinking in the last 2 days except for about 1 shot of liquor today.  States that he has been doing better with drinking lately since starting a new medicine to help him stop.  His feelings are constant, severe, worse when thinking about his wife's illness, no alleviating factors. Tetanus is up-to-date.   Past Medical History:  Diagnosis Date  . Alcohol abuse   . Asthma   . GERD (gastroesophageal reflux disease)   . Gout   . Hypertension   . Kidney stone   . OCD (obsessive compulsive disorder)   . Renal colic      Patient Active Problem List   Diagnosis Date Noted  . Alcohol withdrawal ([place]) 05/07/2019  . Alcoholic intoxication with complication ([place])   . Sepsis ([place]) 03/08/2019  . Breast lump or mass 10/04/2018  . Sepsis secondary to UTI ([place]) 09/14/2018  . Asthma 07/07/2018  . GERD (gastroesophageal reflux disease) 07/07/2018  . OCD (obsessive compulsive disorder) 07/07/2018  . Self-inflicted laceration of wrist 03/10/2018  . Leg hematoma 12/25/2017  . Suicide and self-inflicted injury by cutting and piercing instrument ([place]) 03/10/2017  . Severe recurrent major 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 ([place]) 03/09/2017  . Substance induced mood disorder ([place]) 08/15/2016  .  Involuntary commitment 08/15/2016  . Alcohol use disorder, severe, dependence ([place]) 02/05/2016  . Hypertension 12/05/2015  . Tachycardia 12/05/2015  . Gout 11/13/2015  . Chronic back pain 05/02/2015     Past Surgical History:  Procedure Laterality Date  . CHOLECYSTECTOMY  2012  . EXTRACORPOREAL SHOCK WAVE LITHOTRIPSY Left 01/12/2019   Procedure: EXTRACORPOREAL SHOCK WAVE LITHOTRIPSY (ESWL);  Surgeon: Billey Co, MD;  Location: [facility] ORS;  Service: Urology;  Laterality: Left;     Prior to Admission medications   Medication Sig Start Date End Date Taking? Authorizing Provider  allopurinol (ZYLOPRIM) 300 MG tablet Take 1 tablet (300 mg total) by mouth daily. 09/01/18  Yes Tukov-Yual, Arlyss Gandy, NP  chlordiazePOXIDE (LIBRIUM) 25 MG capsule Take 25mg  three times a day for 2 days, then take 25mg  twice a day for 2 days, then take 25mg  once a day for 2 days 05/08/19  Yes Mayo, Pete Pelt, MD  folic acid (FOLVITE) 1 MG tablet Take 1 tablet (1 mg total) by mouth daily. 09/01/18  Yes Tukov-Yual, Magdalene S, NP  gabapentin (NEURONTIN) 100 MG capsule Take 2 capsules (200 mg total) by mouth 3 (three) times daily. 04/21/19  Yes Patrecia Pour, NP  lisinopril (PRINIVIL,ZESTRIL) 20 MG tablet Take 1 tablet (20 mg total) by mouth daily. 02/21/19  Yes Iloabachie, Chioma E, NP  Multiple Vitamins-Minerals (MULTIVITAMIN WITH MINERALS) tablet Take 1 tablet by mouth daily. 09/01/18  Yes Tukov-Yual, Magdalene S, NP  naltrexone (DEPADE) 50 MG tablet Take 1 tablet (50 mg total) by mouth daily for 30  days. 05/08/19 06/07/19 Yes Mayo, Pete Pelt, MD  albuterol (VENTOLIN HFA) 108 (90 Base) MCG/ACT inhaler Inhale 2 puffs into the lungs every 4 (four) hours as needed. Patient taking differently: Inhale 2 puffs into the lungs every 4 (four) hours as needed for wheezing or shortness of breath.  09/01/18   Tukov-Yual, Arlyss Gandy, NP  colchicine 0.6 MG tablet Take 1 tablet (0.6 mg total) by mouth daily. Patient taking  differently: Take 0.6 mg by mouth daily as needed.  03/09/19   Lang Snow, NP  docusate sodium (COLACE) 100 MG capsule Take 1 tablet once or twice daily as needed for constipation while taking narcotic pain medicine 01/09/19   Hinda Kehr, MD  Fluticasone-Salmeterol (ADVAIR DISKUS) 100-50 MCG/DOSE AEPB Inhale 1 puff into the lungs 2 (two) times daily. 09/01/18   Tukov-Yual, Arlyss Gandy, NP  tiZANidine (ZANAFLEX) 4 MG tablet Take 0.5 tablets (2 mg total) by mouth every 8 (eight) hours as needed for muscle spasms. 04/26/19   Iloabachie, Chioma E, NP     Allergies Percocet [oxycodone-acetaminophen]   Family History  Problem Relation Age of Onset  . Alcohol abuse Father   . Breast cancer Mother 88    Social History Social History   Tobacco Use  . Smoking status: Former Research scientist (life sciences)  . Smokeless tobacco: Never Used  . Tobacco comment: quit 30 years ago  Substance Use Topics  . Alcohol use: Yes    Alcohol/week: 2.0 standard drinks    Types: 2 Glasses of wine per week    Comment: with lunch  . Drug use: No    Review of Systems  Constitutional:   No fever or chills.  ENT:   No sore throat. No rhinorrhea. Cardiovascular:   No chest pain or syncope. Respiratory:   No dyspnea or cough. Gastrointestinal:   Negative for abdominal pain, vomiting and diarrhea.  Musculoskeletal: Left forearm laceration All other systems reviewed and are negative except as documented above in ROS and HPI.  ____________________________________________   PHYSICAL EXAM:  VITAL SIGNS: ED Triage Vitals [05/10/19 1631]  Enc Vitals Group     BP      Pulse      Resp      Temp 98.2 F (36.8 C)     Temp Source Oral     SpO2      Weight 184 lb 15.5 oz (83.9 kg)     Height      Head Circumference      Peak Flow      Pain Score 6     Pain Loc      Pain Edu?      Excl. in [place]?     Vital signs reviewed, nursing assessments reviewed.   Constitutional:   Alert and oriented. Non-toxic  appearance. Eyes:   Conjunctivae are normal. EOMI. PERRL. ENT      Head:   Normocephalic and atraumatic.      Nose:   No congestion/rhinnorhea.       Mouth/Throat:   MMM, no pharyngeal erythema. No peritonsillar mass.       Neck:   No meningismus. Full ROM. Hematological/Lymphatic/Immunilogical:   No cervical lymphadenopathy. Cardiovascular:   RRR. Symmetric bilateral radial and DP pulses.  No murmurs. Cap refill less than 2 seconds. Respiratory:   Normal respiratory effort without tachypnea/retractions. Breath sounds are clear and equal bilaterally. No wheezes/rales/rhonchi. Gastrointestinal:   Soft and nontender. Non distended. There is no CVA tenderness.  No rebound, rigidity, or guarding.  Musculoskeletal:  Normal range of motion in all extremities. No joint effusions.  No lower extremity tenderness.  No edema.  Left hand is warm with good perfusion. Neurologic:   Normal speech and language.  Patient is tearful, depressed affect. Motor grossly intact. No acute focal neurologic deficits are appreciated.  Skin:    Skin is warm, dry with superficial linear abrasions to the left volar forearm, longitudinally oriented to the arm.  No full-thickness lacerations, no wound repair needed.  No inflammatory changes or signs of infection.  No bleeding.  ____________________________________________    LABS (pertinent positives/negatives) (all labs ordered are listed, but only abnormal results are displayed) Labs Reviewed  ACETAMINOPHEN LEVEL - Abnormal; Notable for the following components:      Result Value   Acetaminophen (Tylenol), Serum <10 (*)    All other components within normal limits  COMPREHENSIVE METABOLIC PANEL - Abnormal; Notable for the following components:   CO2 21 (*)    Glucose, Bld 112 (*)    AST 11 (*)    All other components within normal limits  ETHANOL - Abnormal; Notable for the following components:   Alcohol, Ethyl (B) 230 (*)    All other components within  normal limits  CBC WITH DIFFERENTIAL/PLATELET - Abnormal; Notable for the following components:   Hemoglobin 11.4 (*)    HCT 37.2 (*)    RDW 18.3 (*)    All other components within normal limits  LIPASE, BLOOD  SALICYLATE LEVEL   ____________________________________________   EKG    ____________________________________________    RADIOLOGY  Ct Abdomen Pelvis W Contrast  Result Date: 05/09/2019 CLINICAL DATA:  Fall with abdominal trauma EXAM: CT ABDOMEN AND PELVIS WITH CONTRAST TECHNIQUE: Multidetector CT imaging of the abdomen and pelvis was performed using the standard protocol following bolus administration of intravenous contrast. CONTRAST:  161mL OMNIPAQUE IOHEXOL 300 MG/ML  SOLN COMPARISON:  CT abdomen pelvis 03/08/2019 FINDINGS: LOWER CHEST: There is no basilar pleural or apical pericardial effusion. HEPATOBILIARY: The hepatic contours and density are normal. There is no intra- or extrahepatic biliary dilatation. The gallbladder is normal. PANCREAS: The pancreatic parenchymal contours are normal and there is no ductal dilatation. There is no peripancreatic fluid collection. SPLEEN: Normal. ADRENALS/URINARY TRACT: --Adrenal glands: Normal. --Right kidney/ureter: Multiple nonobstructing renal calculi, measuring up to 5 mm. No hydronephrosis, perinephric stranding or solid renal mass. --Left kidney/ureter: Multiple nonobstructing renal calculi, measuring up to mm. No hydronephrosis, perinephric stranding or solid renal mass. --Urinary bladder: Normal for degree of distention STOMACH/BOWEL: --Stomach/Duodenum: There is no hiatal hernia or other gastric abnormality. The duodenal course and caliber are normal. --Small bowel: No dilatation or inflammation. --Colon: No focal abnormality. --Appendix: Normal. VASCULAR/LYMPHATIC: Normal course and caliber of the major abdominal vessels. No abdominal or pelvic lymphadenopathy. REPRODUCTIVE: Enlarged prostate measures 5.1 cm in transverse dimension.  MUSCULOSKELETAL. No bony spinal canal stenosis or focal osseous abnormality. Healing posterior right twelfth rib fracture. OTHER: None. IMPRESSION: 1. No acute abnormality of the abdomen or pelvis. 2. Bilateral nonobstructive nephrolithiasis. 3. Healing fracture of the posterior right twelfth rib. Electronically Signed   By: Ulyses Jarred M.D.   On: 05/09/2019 23:13    ____________________________________________   PROCEDURES Procedures  ____________________________________________    CLINICAL IMPRESSION / ASSESSMENT AND PLAN / ED COURSE  Medications ordered in the ED: Medications  LORazepam (ATIVAN) tablet 1 mg (1 mg Oral Given 05/10/19 1656)    Pertinent labs & imaging results that were available during my care of the patient were reviewed by me  and considered in my medical decision making (see chart for details).  BOHDEN DUNG was evaluated in Emergency Department on 05/10/2019 for the symptoms described in the history of present illness. He was evaluated in the context of the global COVID-19 pandemic, which necessitated consideration that the patient might be at risk for infection with the SARS-CoV-2 virus that causes COVID-19. Institutional protocols and algorithms that pertain to the evaluation of patients at risk for COVID-19 are in a state of rapid change based on information released by regulatory bodies including the CDC and federal and state organizations. These policies and algorithms were followed during the patient's care in the ED.   Patient presents with SI and depressed affect.  He denies having a recent drinking, but is known to this department to be a habitual alcohol abuser.  Labs show an alcohol level of about 230.  I will obtain a psychiatry consultation on the patient today to assess for safety.  He is well-known to this department and I do not think that IVC would be appropriate for him at this time.  He is clinically sober.  I will give him oral Ativan for his  anxiety.      ____________________________________________   FINAL CLINICAL IMPRESSION(S) / ED DIAGNOSES    Final diagnoses:  Alcohol abuse  Suicidal ideation     ED Discharge Orders    None      Portions of this note were generated with dragon dictation software. Dictation errors may occur despite best attempts at proofreading.   Carrie Mew, MD 05/10/19 1757

## 2019-05-10 NOTE — ED Notes (Signed)
VOL/Consult ordered/Pending ?

## 2019-05-10 NOTE — ED Triage Notes (Signed)
Pt to ED via EMS after calling from a parking lot reporting increased anxiety. Pt reporting he can not take care of his wife that was dx with MS and there is increased stress around this. Pt has admitted to self harm with cuts noted. Pt denies SI to this RN but EMS verbalized that pt made multiple comments about hurting self. NO HI reported. Pt calm and cooperative.

## 2019-05-10 NOTE — ED Notes (Signed)
Pt. Resting in 19 hallway bed.  Pt. Calm and cooperative.  Pt. Told he would be talking to a psychiatrist tonight.

## 2019-05-11 NOTE — ED Provider Notes (Addendum)
-----------------------------------------   2:18 AM on 05/11/2019 -----------------------------------------   Temperature 98.2 F (36.8 C), temperature source Oral, weight 83.9 kg.  The patient is calm and cooperative at this time.  I reviewed the written report from the specialist on-call psychiatrist who feels that the patient does not need inpatient psychiatric care and does not need involuntary commitment.  I am discharging as per their recommendations.    Hinda Kehr, MD 05/11/19 [phone]

## 2019-05-11 NOTE — ED Notes (Signed)
Pt. Going home via [facility]

## 2019-05-11 NOTE — ED Notes (Signed)
SOC called report given, pt. Moved to interview room, patient and [facility] machine ready.

## 2019-05-16 ENCOUNTER — Ambulatory Visit: Payer: No Typology Code available for payment source | Admitting: Licensed Clinical Social Worker

## 2019-05-16 ENCOUNTER — Other Ambulatory Visit: Payer: Self-pay

## 2019-05-16 DIAGNOSIS — F19280 Other psychoactive substance dependence with psychoactive substance-induced anxiety disorder: Secondary | ICD-10-CM

## 2019-05-16 DIAGNOSIS — F1020 Alcohol dependence, uncomplicated: Secondary | ICD-10-CM

## 2019-05-16 DIAGNOSIS — F4321 Adjustment disorder with depressed mood: Secondary | ICD-10-CM

## 2019-05-16 DIAGNOSIS — F19239 Other psychoactive substance dependence with withdrawal, unspecified: Secondary | ICD-10-CM

## 2019-05-16 NOTE — BH Specialist Note (Signed)
Integrated Behavioral Health Follow Up Visit Via Phone  MRN: [id number] Name: Miguel [name]  Number of [place] Clinician visits: 6/6  Type of Service: [place] Interpretor:No. Interpretor Name and Language: Not applicable.  SUBJECTIVE: Miguel [name] is a 58 y.o. male accompanied by himself. Patient was referred by Carlyon Shadow NP for mental health. Patient reports the following symptoms/concerns: He admits to cutting on his left arm on 05/09/2019 and went to [facility]. He admits to feeling down and depressed once in awhile. He explains when he drinks that he has suicidal thoughts and has cut himself during these episodes. He reports that his brother sent him an eight to ten page letter and could barely get through the first page without breaking down. He explains that the letter discussed things from their past, custody battle between his parents, dad's decline in health leading up to his death, and his dad's death itself. He notes that his brother sent him a small urn containing some of his dad's ashes and didn't sit well with him. He reports that his dad was in poor health, had a history of heart issue, lost his company, second wife, home caught fire, and drank a lot prior to his death. He denies suicidal and homicidal thoughts.  Duration of problem: ; Severity of problem: mild  OBJECTIVE: Mood: Euthymic and Affect: Appropriate Risk of harm to self or others: No plan to harm self or others  LIFE CONTEXT: Family and Social: See above. School/Work: See above. Self-Care: See above. Life Changes: See above.  GOALS ADDRESSED: Patient will: 1.  Reduce symptoms of: anxiety and grief and substance abuse  2.  Increase knowledge and/or ability of: coping skills, healthy habits, self-management skills and stress reduction  3.  Demonstrate ability to: Increase healthy adjustment to current life circumstances  INTERVENTIONS: Interventions  utilized:  Brief CBT was utilized by the clinician focusing on the patient's recent hospitalization, substance abuse, anxiety, and grief. Clinician processed with the patient regarding how he has been doing since the last follow up session. Clinician explained to the patient that she saw where he went to the ER at [facility] twice on 05/09/2019 for a cut on his left arm and another for withdrawal from alcohol. Clinician discussed with the patient regarding how often he experiences these suicidal thoughts. Clinician explained to the patient that with his dad's passing and feeling helpless due to his wife's chronic health problems that he is trying to drink to avoid having these thoughts and feelings. Clinician explained to the patient that alcohol is his escape from having to grieve his father's death and accept the fact that he cannot fix his wife's condition. Clinician explained to the patient that he will continue to go through this cycle of drinking, withdrawal, self harm, suicidal thoughts, and anxiety until he comes to terms with his grief and the situation he is in. Clinician encouraged the patient to establish a routine, come up with some coping skills, and things for enjoyment. Clinician suggested that the patient look into Alcoholic's Anonymous to be able to expand his support system and follow the 12 steps towards his recovery from alcohol.  Standardized Assessments completed: GAD-7 and PHQ 9  ASSESSMENT: Patient currently experiencing see above.   Patient may benefit from see above.  PLAN: 1. Follow up with behavioral health clinician on : one week or earlier if needed. 2. Behavioral recommendations: see above. 3. Referral(s): [place] (In Clinic) 4. "From  scale of 1-10, how likely are you to follow plan?": [place], LCSW

## 2019-05-17 ENCOUNTER — Encounter: Payer: Self-pay | Admitting: *Deleted

## 2019-05-17 ENCOUNTER — Other Ambulatory Visit: Payer: Self-pay

## 2019-05-17 ENCOUNTER — Emergency Department
Admission: EM | Admit: 2019-05-17 | Discharge: 2019-05-17 | Disposition: A | Discharge status: Home / Self Care | Payer: Medicaid Other | Attending: Student in an Organized Health Care Education/Training Program | Admitting: Student in an Organized Health Care Education/Training Program

## 2019-05-17 DIAGNOSIS — R1013 Epigastric pain: Secondary | ICD-10-CM | POA: Insufficient documentation

## 2019-05-17 DIAGNOSIS — Z79899 Other long term (current) drug therapy: Secondary | ICD-10-CM | POA: Insufficient documentation

## 2019-05-17 DIAGNOSIS — J45909 Unspecified asthma, uncomplicated: Secondary | ICD-10-CM | POA: Insufficient documentation

## 2019-05-17 DIAGNOSIS — I10 Essential (primary) hypertension: Secondary | ICD-10-CM | POA: Insufficient documentation

## 2019-05-17 DIAGNOSIS — Z87891 Personal history of nicotine dependence: Secondary | ICD-10-CM | POA: Insufficient documentation

## 2019-05-17 LAB — COMPREHENSIVE METABOLIC PANEL
ALT: 11 U/L (ref 0–44)
AST: 10 U/L — ABNORMAL LOW (ref 15–41)
Albumin: 4 g/dL (ref 3.5–5.0)
Alkaline Phosphatase: 70 U/L (ref 38–126)
Anion gap: 15 (ref 5–15)
BUN: 11 mg/dL (ref 6–20)
CO2: 21 mmol/L — ABNORMAL LOW (ref 22–32)
Calcium: 9 mg/dL (ref 8.9–10.3)
Chloride: 105 mmol/L (ref 98–111)
Creatinine, Ser: 0.91 mg/dL (ref 0.61–1.24)
GFR calc Af Amer: >60 mL/min (ref >60)
GFR calc non Af Amer: >60 mL/min (ref >60)
Glucose, Bld: 100 mg/dL — ABNORMAL HIGH (ref 70–99)
Potassium: 3.6 mmol/L (ref 3.5–5.1)
Sodium: 141 mmol/L (ref 135–145)
Total Bilirubin: 0.5 mg/dL (ref 0.3–1.2)
Total Protein: 7.1 g/dL (ref 6.5–8.1)

## 2019-05-17 LAB — URINALYSIS, COMPLETE (UACMP) WITH MICROSCOPIC
Bacteria, UA: NONE SEEN
Bilirubin Urine: NEGATIVE
Glucose, UA: NEGATIVE mg/dL
Hgb urine dipstick: NEGATIVE
Ketones, ur: NEGATIVE mg/dL
Nitrite: NEGATIVE
Protein, ur: NEGATIVE mg/dL
Specific Gravity, Urine: 1.009 (ref 1.005–1.030)
pH: 6 (ref 5.0–8.0)

## 2019-05-17 LAB — CBC
HCT: 42.6 % (ref 39.0–52.0)
Hemoglobin: 13.2 g/dL (ref 13.0–17.0)
MCH: 26.7 pg (ref 26.0–34.0)
MCHC: 31 g/dL (ref 30.0–36.0)
MCV: 86.2 fL (ref 80.0–100.0)
Platelets: 256 10*3/uL (ref 150–400)
RBC: 4.94 MIL/uL (ref 4.22–5.81)
RDW: 18.5 % — ABNORMAL HIGH (ref 11.5–15.5)
WBC: 8.2 10*3/uL (ref 4.0–10.5)
nRBC: 0 % (ref 0.0–0.2)

## 2019-05-17 LAB — LIPASE, BLOOD: Lipase: 33 U/L (ref 11–51)

## 2019-05-17 MED ORDER — SODIUM CHLORIDE 0.9% FLUSH
3.0000 mL | Freq: Once | INTRAVENOUS | Status: AC
  Administered 2019-05-17: 3 mL via INTRAVENOUS

## 2019-05-17 MED ORDER — PROMETHAZINE HCL 25 MG/ML IJ SOLN
12.5000 mg | Freq: Once | INTRAMUSCULAR | Status: AC
  Administered 2019-05-17: 12.5 mg via INTRAMUSCULAR
  Filled 2019-05-17: qty 1

## 2019-05-17 MED ORDER — PROMETHAZINE HCL 12.5 MG PO TABS: 12.5000 mg | ORAL_TABLET | Freq: Four times a day (QID) | ORAL | 0 refills | Status: DC | PRN

## 2019-05-17 MED ORDER — LORAZEPAM 2 MG/ML IJ SOLN
1.0000 mg | Freq: Once | INTRAMUSCULAR | Status: AC
  Administered 2019-05-17: 1 mg via INTRAMUSCULAR
  Filled 2019-05-17: qty 1

## 2019-05-17 NOTE — ED Notes (Signed)
Patient gave crackers and peanut butter along with ginger ale to PO challenge.

## 2019-05-17 NOTE — ED Triage Notes (Signed)
Pt brought in via ems form home.  Pt has upper abd pain.  Vomited x 2.  No diarrhea.  No chest pain.  Sx began today.  Pt alert.

## 2019-05-17 NOTE — Discharge Instructions (Signed)

## 2019-05-17 NOTE — ED Notes (Signed)
Pt to ER via EMS from home c/o abdominal pain X2 hours, emesis X 2. VSS. Pt alert and oriented X4, active, cooperative, pt in NAD. RR even and unlabored, color WNL.

## 2019-05-17 NOTE — ED Notes (Signed)
Patient states having upper gastric pain. Patient states it started earlier today and was not relieved with a tums. Patient states he has not ate last night or today and denies any alcohol intake today.

## 2019-05-17 NOTE — ED Provider Notes (Signed)
[facility] Emergency Department Provider Note    First MD Initiated Contact with Patient 05/17/19 1943     (approximate)  I have reviewed the triage vital signs and the nursing notes.   HISTORY  Chief Complaint Abdominal Pain    HPI Miguel [name] is a 58 y.o. male the below listed past medical history presents the ER for nausea and epigastric discomfort that started earlier today.  Patient did drink alcohol last night.  Denies any fever.  States he has had little to eat.  Denies any back pain or shortness of breath.  No chest pain.  Has had similar pains in the past.   Denies any hallucinations, SI or HI.   Past Medical History:  Diagnosis Date   Alcohol abuse    Asthma    GERD (gastroesophageal reflux disease)    Gout    Hypertension    Kidney stone    OCD (obsessive compulsive disorder)    Renal colic    Family History  Problem Relation Age of Onset   Alcohol abuse Father    Breast cancer Mother 19   Past Surgical History:  Procedure Laterality Date   CHOLECYSTECTOMY  2012   EXTRACORPOREAL SHOCK WAVE LITHOTRIPSY Left 01/12/2019   Procedure: EXTRACORPOREAL SHOCK WAVE LITHOTRIPSY (ESWL);  Surgeon: Billey Co, MD;  Location: [facility] ORS;  Service: Urology;  Laterality: Left;   Patient Active Problem List   Diagnosis Date Noted   Alcohol withdrawal ([place]) 07/68/0881   Alcoholic intoxication with complication ([place])    Sepsis ([place]) 03/08/2019   Breast lump or mass 10/04/2018   Sepsis secondary to UTI ([place]) 09/14/2018   Asthma 07/07/2018   GERD (gastroesophageal reflux disease) 07/07/2018   OCD (obsessive compulsive disorder) 09/15/5944   Self-inflicted laceration of wrist 03/10/2018   Leg hematoma 12/25/2017   Suicide and self-inflicted injury by cutting and piercing instrument ([place]) 03/10/2017   Severe recurrent major 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 ([place]) 03/09/2017   Substance induced mood disorder ([place])  08/15/2016   Involuntary commitment 08/15/2016   Alcohol use disorder, severe, dependence ([place]) 02/05/2016   Hypertension 12/05/2015   Tachycardia 12/05/2015   Gout 11/13/2015   Chronic back pain 05/02/2015      Prior to Admission medications   Medication Sig Start Date End Date Taking? Authorizing Provider  albuterol (VENTOLIN HFA) 108 (90 Base) MCG/ACT inhaler Inhale 2 puffs into the lungs every 4 (four) hours as needed. Patient taking differently: Inhale 2 puffs into the lungs every 4 (four) hours as needed for wheezing or shortness of breath.  09/01/18   Tukov-Yual, Arlyss Gandy, NP  allopurinol (ZYLOPRIM) 300 MG tablet Take 1 tablet (300 mg total) by mouth daily. 09/01/18   Tukov-Yual, Arlyss Gandy, NP  chlordiazePOXIDE (LIBRIUM) 25 MG capsule Take 25mg  three times a day for 2 days, then take 25mg  twice a day for 2 days, then take 25mg  once a day for 2 days 05/08/19   Mayo, Pete Pelt, MD  colchicine 0.6 MG tablet Take 1 tablet (0.6 mg total) by mouth daily. Patient taking differently: Take 0.6 mg by mouth daily as needed.  03/09/19   Lang Snow, NP  docusate sodium (COLACE) 100 MG capsule Take 1 tablet once or twice daily as needed for constipation while taking narcotic pain medicine 01/09/19   Hinda Kehr, MD  Fluticasone-Salmeterol (ADVAIR DISKUS) 100-50 MCG/DOSE AEPB Inhale 1 puff into the lungs 2 (two) times daily. 09/01/18   Erlene Quan, NP  folic  acid (FOLVITE) 1 MG tablet Take 1 tablet (1 mg total) by mouth daily. 09/01/18   Tukov-Yual, Arlyss Gandy, NP  gabapentin (NEURONTIN) 100 MG capsule Take 2 capsules (200 mg total) by mouth 3 (three) times daily. 04/21/19   Patrecia Pour, NP  lisinopril (PRINIVIL,ZESTRIL) 20 MG tablet Take 1 tablet (20 mg total) by mouth daily. 02/21/19   Iloabachie, Chioma E, NP  Multiple Vitamins-Minerals (MULTIVITAMIN WITH MINERALS) tablet Take 1 tablet by mouth daily. 09/01/18   Tukov-Yual, Arlyss Gandy, NP  naltrexone (DEPADE)  50 MG tablet Take 1 tablet (50 mg total) by mouth daily for 30 days. 05/08/19 06/07/19  Mayo, Pete Pelt, MD  promethazine (PHENERGAN) 12.5 MG tablet Take 1 tablet (12.5 mg total) by mouth every 6 (six) hours as needed. 05/17/19   Merlyn Lot, MD  tiZANidine (ZANAFLEX) 4 MG tablet Take 0.5 tablets (2 mg total) by mouth every 8 (eight) hours as needed for muscle spasms. 04/26/19   Iloabachie, Lonny Prude, NP    Allergies Percocet [oxycodone-acetaminophen]    Social History Social History   Tobacco Use   Smoking status: Former Smoker   Smokeless tobacco: Never Used   Tobacco comment: quit 30 years ago  Substance Use Topics   Alcohol use: Yes    Alcohol/week: 2.0 standard drinks    Types: 2 Glasses of wine per week    Comment: with lunch   Drug use: No    Review of Systems Patient denies headaches, rhinorrhea, blurry vision, numbness, shortness of breath, chest pain, edema, cough, abdominal pain, nausea, vomiting, diarrhea, dysuria, fevers, rashes or hallucinations unless otherwise stated above in HPI. ____________________________________________   PHYSICAL EXAM:  VITAL SIGNS: Vitals:   05/17/19 1624 05/17/19 2230  BP: 125/77 (!) 148/83  Pulse: 90 92  Resp: 18 18  Temp: 98.6 F (37 C) 98.3 F (36.8 C)  SpO2: 95% 96%    Constitutional: Alert and oriented.  Eyes: Conjunctivae are normal.  Head: Atraumatic. Nose: No congestion/rhinnorhea. Mouth/Throat: Mucous membranes are moist.   Neck: No stridor. Painless ROM.  Cardiovascular: Normal rate, regular rhythm. Grossly normal heart sounds.  Good peripheral circulation. Respiratory: Normal respiratory effort.  No retractions. Lungs CTAB. Gastrointestinal: Soft and nontender. No distention. No abdominal bruits. No CVA tenderness. Genitourinary:  Musculoskeletal: No lower extremity tenderness nor edema.  No joint effusions. Neurologic:  Normal speech and language. No gross focal neurologic deficits are appreciated. No  facial droop Skin:  Skin is warm, dry and intact. No rash noted. Psychiatric: Mood and affect are normal. Speech and behavior are normal.  ____________________________________________   LABS (all labs ordered are listed, but only abnormal results are displayed)  Results for orders placed or performed during the hospital encounter of 05/17/19 (from the past 24 hour(s))  Lipase, blood     Status: None   Collection Time: 05/17/19  4:23 PM  Result Value Ref Range   Lipase 33 11 - 51 U/L  Comprehensive metabolic panel     Status: Abnormal   Collection Time: 05/17/19  4:23 PM  Result Value Ref Range   Sodium 141 135 - 145 mmol/L   Potassium 3.6 3.5 - 5.1 mmol/L   Chloride 105 98 - 111 mmol/L   CO2 21 (L) 22 - 32 mmol/L   Glucose, Bld 100 (H) 70 - 99 mg/dL   BUN 11 6 - 20 mg/dL   Creatinine, Ser 0.91 0.61 - 1.24 mg/dL   Calcium 9.0 8.9 - 10.3 mg/dL   Total Protein 7.1 6.5 -  8.1 g/dL   Albumin 4.0 3.5 - 5.0 g/dL   AST 10 (L) 15 - 41 U/L   ALT 11 0 - 44 U/L   Alkaline Phosphatase 70 38 - 126 U/L   Total Bilirubin 0.5 0.3 - 1.2 mg/dL   GFR calc non Af Amer >60 >60 mL/min   GFR calc Af Amer >60 >60 mL/min   Anion gap 15 5 - 15  CBC     Status: Abnormal   Collection Time: 05/17/19  4:23 PM  Result Value Ref Range   WBC 8.2 4.0 - 10.5 K/uL   RBC 4.94 4.22 - 5.81 MIL/uL   Hemoglobin 13.2 13.0 - 17.0 g/dL   HCT 42.6 39.0 - 52.0 %   MCV 86.2 80.0 - 100.0 fL   MCH 26.7 26.0 - 34.0 pg   MCHC 31.0 30.0 - 36.0 g/dL   RDW 18.5 (H) 11.5 - 15.5 %   Platelets 256 150 - 400 K/uL   nRBC 0.0 0.0 - 0.2 %  Urinalysis, Complete w Microscopic     Status: Abnormal   Collection Time: 05/17/19  4:23 PM  Result Value Ref Range   Color, Urine YELLOW (A) YELLOW   APPearance HAZY (A) CLEAR   Specific Gravity, Urine 1.009 1.005 - 1.030   pH 6.0 5.0 - 8.0   Glucose, UA NEGATIVE NEGATIVE mg/dL   Hgb urine dipstick NEGATIVE NEGATIVE   Bilirubin Urine NEGATIVE NEGATIVE   Ketones, ur NEGATIVE NEGATIVE  mg/dL   Protein, ur NEGATIVE NEGATIVE mg/dL   Nitrite NEGATIVE NEGATIVE   Leukocytes,Ua MODERATE (A) NEGATIVE   RBC / HPF 0-5 0 - 5 RBC/hpf   WBC, UA 6-10 0 - 5 WBC/hpf   Bacteria, UA NONE SEEN NONE SEEN   Squamous Epithelial / LPF 0-5 0 - 5   ____________________________________________  EKG My review and personal interpretation at Time: 16:32   Indication: epigastric pain  Rate: 85  Rhythm: sinus Axis: normal  Other: no stemi, nonsepcific st abn ____________________________________________  RADIOLOGY   ____________________________________________   PROCEDURES  Procedure(s) performed:  Procedures    Critical Care performed: no ____________________________________________   INITIAL IMPRESSION / ASSESSMENT AND PLAN / ED COURSE  Pertinent labs & imaging results that were available during my care of the patient were reviewed by me and considered in my medical decision making (see chart for details).   DDX: Gastritis, enteritis, cholecystitis, cholelithiasis, hepatitis, pancreatitis, ACS, esophagitis  Miguel [name] is a 58 y.o. who presents to the ED with symptoms as described above.  Patient well-appearing.  Does admit to drinking alcohol last night and his exam is most consistent with alcoholic gastritis.  His blood work is reassuring.  Will observe in the ER provide symptomatic management including antiemetic.  Clinical Course as of May 16 2308  Wed May 17, 2019  2223 Tolerating p.o.  Do suspect some component of gastritis.  Given reassuring labs with recent CT imaging being normal tolerating oral hydration without fever or tachycardia I do believe he stable and appropriate for outpatient follow-up.  Not feel that repeat imaging indicated on an emergent basis.  This not consistent with SBO or other acute intra-abdominal process.  Have discussed with the patient and available family all diagnostics and treatments performed thus far and all questions were answered to the best  of my ability. The patient demonstrates understanding and agreement with plan.    [PR]    Clinical Course User Index [PR] Merlyn Lot, MD    The  patient was evaluated in Emergency Department today for the symptoms described in the history of present illness. He/she was evaluated in the context of the global COVID-19 pandemic, which necessitated consideration that the patient might be at risk for infection with the SARS-CoV-2 virus that causes COVID-19. Institutional protocols and algorithms that pertain to the evaluation of patients at risk for COVID-19 are in a state of rapid change based on information released by regulatory bodies including the CDC and federal and state organizations. These policies and algorithms were followed during the patient's care in the ED.  As part of my medical decision making, I reviewed the following data within the [place] notes reviewed and incorporated, Labs reviewed, notes from prior ED visits and  Controlled Substance Database   ____________________________________________   FINAL CLINICAL IMPRESSION(S) / ED DIAGNOSES  Final diagnoses:  Epigastric pain      NEW MEDICATIONS STARTED DURING THIS VISIT:  Discharge Medication List as of 05/17/2019 10:25 PM    START taking these medications   Details  promethazine (PHENERGAN) 12.5 MG tablet Take 1 tablet (12.5 mg total) by mouth every 6 (six) hours as needed., Starting Wed 05/17/2019, Normal         Note:  This document was prepared using Dragon voice recognition software and may include unintentional dictation errors.    Merlyn Lot, MD 05/17/19 [phone]

## 2019-05-23 ENCOUNTER — Ambulatory Visit: Payer: No Typology Code available for payment source | Admitting: Licensed Clinical Social Worker

## 2019-05-23 ENCOUNTER — Other Ambulatory Visit: Payer: Self-pay

## 2019-05-23 DIAGNOSIS — F19239 Other psychoactive substance dependence with withdrawal, unspecified: Secondary | ICD-10-CM

## 2019-05-23 DIAGNOSIS — F1020 Alcohol dependence, uncomplicated: Secondary | ICD-10-CM

## 2019-05-23 NOTE — BH Specialist Note (Signed)
Integrated Behavioral Health Follow Up Visit via phone  MRN: [id number] Name: Miguel [name]  Type of Service: [place] Interpretor:No. Interpretor Name and Language: not applicable.  SUBJECTIVE: Miguel [name] is a 58 y.o. male accompanied by himself. Patient was referred by Carlyon Shadow NP for mental health. Patient reports the following symptoms/concerns: He reports that he has been doing well this week compared to last week. He explains that he went to the ER on July 1st due to this unbearable abdominal pain. He explains that his wife got a number from her nurse of a staff member at the local homeless shelter who is in recovery and said he can call him whenever he has the urge to drink. He reports that he has yet to use the phone number. He explains that when he has the urge to drink its due to wanting to shop, "the shakes." He explains that he finished reading the letter his brother sent to him and called him. He reports that his brother just wanted to let him know the events that took place prior to his dad's death and what he did to try to take care of him. He notes that it made him feel a lot better. He denies suicidal and homicidal thoughts.  Duration of problem: ; Severity of problem: mild  OBJECTIVE: Mood: Euthymic and Affect: Appropriate Risk of harm to self or others: No plan to harm self or others  LIFE CONTEXT: Family and Social: see above. School/Work: see above. Self-Care: see above. Life Changes: see above.  GOALS ADDRESSED: Patient will: 1.  Reduce symptoms of: relapse prevention  2.  Increase knowledge and/or ability of: coping skills, healthy habits, self-management skills and stress reduction  3.  Demonstrate ability to: Increase healthy adjustment to current life circumstances  INTERVENTIONS: Interventions utilized:  Motivational Interviewing and Psychoeducation and/or Health Education was utilized by the clinician focusing on  the patient's alcoholism. Clinician processed with the patient regarding how he has been doing since the last follow up session. Clinician discussed with the patient regarding his reason for going to the emergency room on July 1st. Clinician encouraged the patient to reach out to the staff at the homeless shelter who is in recovery to have an additional support and someone that he can contact when he has cravings to drink alcohol. Clinician provided psycho education to the patient explaining that typically the shakes that he is describing are what is referred to as tremors as a result of withdrawal from alcohol. Clinician encouraged the patient to continue to take the Naltrexone, not to drink, stick a routine, and keep his mind occupied.  Standardized Assessments completed: next session  ASSESSMENT: Patient currently experiencing see above.   Patient may benefit from see above.  PLAN: 1. Follow up with behavioral health clinician on : one week or earlier if needed. 2. Behavioral recommendations: see above. 3. Referral(s): [facility] (In Clinic) 4. "From scale of 1-10, how likely are you to follow plan?":   Bayard Hugger, LCSW

## 2019-05-26 ENCOUNTER — Emergency Department
Admission: EM | Admit: 2019-05-26 | Discharge: 2019-05-26 | Disposition: A | Discharge status: Home / Self Care | Payer: Self-pay | Attending: Emergency Medicine | Admitting: Emergency Medicine

## 2019-05-26 ENCOUNTER — Emergency Department: Payer: Self-pay

## 2019-05-26 ENCOUNTER — Other Ambulatory Visit: Payer: Self-pay

## 2019-05-26 ENCOUNTER — Encounter: Payer: Self-pay | Admitting: Emergency Medicine

## 2019-05-26 DIAGNOSIS — Y9289 Other specified places as the place of occurrence of the external cause: Secondary | ICD-10-CM | POA: Insufficient documentation

## 2019-05-26 DIAGNOSIS — R52 Pain, unspecified: Secondary | ICD-10-CM

## 2019-05-26 DIAGNOSIS — F10239 Alcohol dependence with withdrawal, unspecified: Secondary | ICD-10-CM | POA: Insufficient documentation

## 2019-05-26 DIAGNOSIS — Z87891 Personal history of nicotine dependence: Secondary | ICD-10-CM | POA: Insufficient documentation

## 2019-05-26 DIAGNOSIS — Y9389 Activity, other specified: Secondary | ICD-10-CM | POA: Insufficient documentation

## 2019-05-26 DIAGNOSIS — W0110XA Fall on same level from slipping, tripping and stumbling with subsequent striking against unspecified object, initial encounter: Secondary | ICD-10-CM | POA: Insufficient documentation

## 2019-05-26 DIAGNOSIS — Y999 Unspecified external cause status: Secondary | ICD-10-CM | POA: Insufficient documentation

## 2019-05-26 DIAGNOSIS — Z79899 Other long term (current) drug therapy: Secondary | ICD-10-CM | POA: Insufficient documentation

## 2019-05-26 DIAGNOSIS — I10 Essential (primary) hypertension: Secondary | ICD-10-CM | POA: Insufficient documentation

## 2019-05-26 DIAGNOSIS — R0789 Other chest pain: Secondary | ICD-10-CM | POA: Insufficient documentation

## 2019-05-26 LAB — CBC
HCT: 44.8 % (ref 39.0–52.0)
Hemoglobin: 13.7 g/dL (ref 13.0–17.0)
MCH: 25.9 pg — ABNORMAL LOW (ref 26.0–34.0)
MCHC: 30.6 g/dL (ref 30.0–36.0)
MCV: 84.8 fL (ref 80.0–100.0)
Platelets: 256 10*3/uL (ref 150–400)
RBC: 5.28 MIL/uL (ref 4.22–5.81)
RDW: 17.8 % — ABNORMAL HIGH (ref 11.5–15.5)
WBC: 6.6 10*3/uL (ref 4.0–10.5)
nRBC: 0 % (ref 0.0–0.2)

## 2019-05-26 LAB — BASIC METABOLIC PANEL
Anion gap: 18 — ABNORMAL HIGH (ref 5–15)
BUN: 15 mg/dL (ref 6–20)
CO2: 19 mmol/L — ABNORMAL LOW (ref 22–32)
Calcium: 9.4 mg/dL (ref 8.9–10.3)
Chloride: 99 mmol/L (ref 98–111)
Creatinine, Ser: 1.26 mg/dL — ABNORMAL HIGH (ref 0.61–1.24)
GFR calc Af Amer: >60 mL/min (ref >60)
GFR calc non Af Amer: >60 mL/min (ref >60)
Glucose, Bld: 127 mg/dL — ABNORMAL HIGH (ref 70–99)
Potassium: 3.9 mmol/L (ref 3.5–5.1)
Sodium: 136 mmol/L (ref 135–145)

## 2019-05-26 MED ORDER — ONDANSETRON HCL 4 MG/2ML IJ SOLN
4.0000 mg | Freq: Once | INTRAMUSCULAR | Status: AC
  Administered 2019-05-26: 4 mg via INTRAVENOUS
  Filled 2019-05-26: qty 2

## 2019-05-26 MED ORDER — LORAZEPAM 2 MG/ML IJ SOLN
1.0000 mg | Freq: Once | INTRAMUSCULAR | Status: AC
  Administered 2019-05-26: 1 mg via INTRAVENOUS
  Filled 2019-05-26: qty 1

## 2019-05-26 NOTE — ED Provider Notes (Addendum)
[facility] Emergency Department Provider Note   ____________________________________________   First MD Initiated Contact with Patient 05/26/19 1209     (approximate)  I have reviewed the triage vital signs and the nursing notes.   HISTORY  Chief Complaint Fall and Withdrawal    HPI Miguel [name] is a 58 y.o. male patient reports he stopped drinking 3 days ago.  He fell yesterday in the mud.  Complains a lot of pain in the left side of her chest with deep breathing or movement.  He does not have any pain in his belly.  He hit his face but not his head.  He has not had any headache nausea or vomiting.  He is awake alert acting normally.  He is having some tremors.  He says this is from alcohol withdrawal since he has had anything for 3 days.         Past Medical History:  Diagnosis Date  . Alcohol abuse   . Asthma   . GERD (gastroesophageal reflux disease)   . Gout   . Hypertension   . Kidney stone   . OCD (obsessive compulsive disorder)   . Renal colic     Patient Active Problem List   Diagnosis Date Noted  . Alcohol withdrawal ([place]) 05/07/2019  . Alcoholic intoxication with complication ([place])   . Sepsis ([place]) 03/08/2019  . Breast lump or mass 10/04/2018  . Sepsis secondary to UTI ([place]) 09/14/2018  . Asthma 07/07/2018  . GERD (gastroesophageal reflux disease) 07/07/2018  . OCD (obsessive compulsive disorder) 07/07/2018  . Self-inflicted laceration of wrist 03/10/2018  . Leg hematoma 12/25/2017  . Suicide and self-inflicted injury by cutting and piercing instrument ([place]) 03/10/2017  . Severe recurrent major 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 ([place]) 03/09/2017  . Substance induced mood disorder ([place]) 08/15/2016  . Involuntary commitment 08/15/2016  . Alcohol use disorder, severe, dependence ([place]) 02/05/2016  . Hypertension 12/05/2015  . Tachycardia 12/05/2015  . Gout 11/13/2015  . Chronic back pain 05/02/2015    Past Surgical  History:  Procedure Laterality Date  . CHOLECYSTECTOMY  2012  . EXTRACORPOREAL SHOCK WAVE LITHOTRIPSY Left 01/12/2019   Procedure: EXTRACORPOREAL SHOCK WAVE LITHOTRIPSY (ESWL);  Surgeon: Billey Co, MD;  Location: [facility] ORS;  Service: Urology;  Laterality: Left;    Prior to Admission medications   Medication Sig Start Date End Date Taking? Authorizing Provider  albuterol (VENTOLIN HFA) 108 (90 Base) MCG/ACT inhaler Inhale 2 puffs into the lungs every 4 (four) hours as needed. Patient taking differently: Inhale 2 puffs into the lungs every 4 (four) hours as needed for wheezing or shortness of breath.  09/01/18   Tukov-Yual, Arlyss Gandy, NP  allopurinol (ZYLOPRIM) 300 MG tablet Take 1 tablet (300 mg total) by mouth daily. 09/01/18   Tukov-Yual, Arlyss Gandy, NP  chlordiazePOXIDE (LIBRIUM) 25 MG capsule Take 25mg  three times a day for 2 days, then take 25mg  twice a day for 2 days, then take 25mg  once a day for 2 days 05/08/19   Mayo, Pete Pelt, MD  colchicine 0.6 MG tablet Take 1 tablet (0.6 mg total) by mouth daily. Patient taking differently: Take 0.6 mg by mouth daily as needed.  03/09/19   Lang Snow, NP  docusate sodium (COLACE) 100 MG capsule Take 1 tablet once or twice daily as needed for constipation while taking narcotic pain medicine 01/09/19   Hinda Kehr, MD  Fluticasone-Salmeterol (ADVAIR DISKUS) 100-50 MCG/DOSE AEPB Inhale 1 puff into the lungs 2 (  two) times daily. 09/01/18   Tukov-Yual, Arlyss Gandy, NP  folic acid (FOLVITE) 1 MG tablet Take 1 tablet (1 mg total) by mouth daily. 09/01/18   Tukov-Yual, Arlyss Gandy, NP  gabapentin (NEURONTIN) 100 MG capsule Take 2 capsules (200 mg total) by mouth 3 (three) times daily. 04/21/19   Patrecia Pour, NP  lisinopril (PRINIVIL,ZESTRIL) 20 MG tablet Take 1 tablet (20 mg total) by mouth daily. 02/21/19   Iloabachie, Chioma E, NP  Multiple Vitamins-Minerals (MULTIVITAMIN WITH MINERALS) tablet Take 1 tablet by mouth daily. 09/01/18    Tukov-Yual, Arlyss Gandy, NP  naltrexone (DEPADE) 50 MG tablet Take 1 tablet (50 mg total) by mouth daily for 30 days. 05/08/19 06/07/19  Mayo, Pete Pelt, MD  promethazine (PHENERGAN) 12.5 MG tablet Take 1 tablet (12.5 mg total) by mouth every 6 (six) hours as needed. 05/17/19   Merlyn Lot, MD  tiZANidine (ZANAFLEX) 4 MG tablet Take 0.5 tablets (2 mg total) by mouth every 8 (eight) hours as needed for muscle spasms. 04/26/19   Iloabachie, Chioma E, NP    Allergies Percocet [oxycodone-acetaminophen]  Family History  Problem Relation Age of Onset  . Alcohol abuse Father   . Breast cancer Mother 103    Social History Social History   Tobacco Use  . Smoking status: Former Research scientist (life sciences)  . Smokeless tobacco: Never Used  . Tobacco comment: quit 30 years ago  Substance Use Topics  . Alcohol use: Yes    Alcohol/week: 2.0 standard drinks    Types: 2 Glasses of wine per week    Comment: with lunch  . Drug use: No    Review of Systems  Constitutional: No fever/chills Eyes: No visual changes. ENT: No sore throat. Cardiovascular:  chest pain. Respiratory: Denies shortness of breath. Gastrointestinal: No abdominal pain.  No nausea, no vomiting.  No diarrhea.  No constipation. Genitourinary: Negative for dysuria. Musculoskeletal: Negative for back pain. Skin: Negative for rash. Neurological: Negative for headaches, focal weakness   ____________________________________________   PHYSICAL EXAM:  VITAL SIGNS: ED Triage Vitals [05/26/19 1153]  Enc Vitals Group     BP (!) 152/99     Pulse Rate 85     Resp 20     Temp 98.1 F (36.7 C)     Temp Source Oral     SpO2 95 %     Weight 185 lb (83.9 kg)     Height 6' (1.829 m)     Head Circumference      Peak Flow      Pain Score 9     Pain Loc      Pain Edu?      Excl. in [place]?     Constitutional: Alert and oriented. Well appearing and in no acute distress.  He is slightly tremulous. Eyes: Conjunctivae are normal. PER. EOMI. Head:  Atraumatic except for a bruise below the right eye. Nose: No congestion/rhinnorhea. Mouth/Throat: Mucous membranes are moist.  Oropharynx non-erythematous. Neck: No stridor.  Cardiovascular: Normal rate, regular rhythm. Grossly normal heart sounds.  Good peripheral circulation. Respiratory: Normal respiratory effort.  No retractions. Lungs CTAB.  Left chest wall is somewhat tender to palpation Gastrointestinal: Soft and nontender.  There is no tenderness even to deep palpation in the left upper quadrant.  No distention. No abdominal bruits. No CVA tenderness. Musculoskeletal: No lower extremity tenderness nor edema.   Neurologic:  Normal speech and language. No gross focal neurologic deficits are appreciated.  Skin:  Skin is warm, dry and intact.  No rash noted.   ____________________________________________   LABS (all labs ordered are listed, but only abnormal results are displayed)  Labs Reviewed  BASIC METABOLIC PANEL - Abnormal; Notable for the following components:      Result Value   CO2 19 (*)    Glucose, Bld 127 (*)    Creatinine, Ser 1.26 (*)    Anion gap 18 (*)    All other components within normal limits  CBC - Abnormal; Notable for the following components:   MCH 25.9 (*)    RDW 17.8 (*)    All other components within normal limits   ____________________________________________  EKG EKG read and interpreted by me shows normal sinus rhythm rate of 93 normal axis irregular baseline but no acute ST-T wave changes _______________________________________  RADIOLOGY  ED MD interpretation: Chest x-ray and rib films read by radiology reviewed by me show no obvious fractures although there may be fractures of the seventh and eighth ribs on the left side anteriorly.  Official radiology report(s): Dg Chest 2 View  Result Date: 05/26/2019 CLINICAL DATA:  Fall 2 days ago.  Left anterior chest pain. EXAM: CHEST - 2 VIEW COMPARISON:  Radiographs 03/20/2019 and 03/09/2019.  CT  03/08/2019. FINDINGS: The heart size and mediastinal contours are stable. There is improved aeration of both lung bases with minimal residual atelectasis or scarring in the left lower lobe. There is no pleural effusion or pneumothorax. No acute osseous findings are seen. IMPRESSION: No evidence of active cardiopulmonary process. Interval improved aeration of the lung bases. Electronically Signed   By: Richardean Sale M.D.   On: 05/26/2019 14:15   Dg Ribs Unilateral Left  Result Date: 05/26/2019 CLINICAL DATA:  Fall 2 days ago. Left anterior chest pain. EXAM: LEFT RIBS - 2 VIEW COMPARISON:  Chest radiographs today and 03/20/2019. FINDINGS: No evidence of acute left-sided rib fracture, pleural effusion or pneumothorax. The aeration of the left lung base has improved with mild residual atelectasis or scarring. There are possible old fractures of the left 7th and 8th ribs anteriorly, best seen on the oblique view. IMPRESSION: No definite acute rib fracture, pleural effusion or pneumothorax. Possible fractures of the left 7th and 8th ribs anteriorly. Electronically Signed   By: Richardean Sale M.D.   On: 05/26/2019 14:17    ____________________________________________   PROCEDURES  Procedure(s) performed (including Critical Care):  Procedures   ____________________________________________   INITIAL IMPRESSION / ASSESSMENT AND PLAN / ED COURSE  Patient has trouble with alcohol addiction already.  I will have him take Motrin as needed for the pain.  He can return for any worsening at all.  Follow-up with his doctor this coming week.          ____________________________________________   FINAL CLINICAL IMPRESSION(S) / ED DIAGNOSES  Final diagnoses:  Chest wall pain     ED Discharge Orders    None       Note:  This document was prepared using Dragon voice recognition software and may include unintentional dictation errors.    Nena Polio, MD 05/26/19 1450    Nena Polio, MD 05/26/19 1451

## 2019-05-26 NOTE — ED Triage Notes (Signed)
FIRST NURSE NOTE-fell and hurt left ribs 2 days ago. Reports bruise to left flank. No blood thinners.  Ambulatory. Unlabored.

## 2019-05-26 NOTE — Discharge Instructions (Addendum)
Use Tylenol or Motrin for the pain.  You can take 4 of the over-the-counter Motrin 3 times a day with food.  Please return for increasing pain shortness of breath large bruises or wooziness or any other problems.  Please follow-up with your regular doctor this coming week.  The radiologist and I reviewed your x-rays.  Do not seeing any obvious fractures.

## 2019-05-26 NOTE — ED Triage Notes (Signed)
Pt presents to ED c/o fall Tuesday, states he slipped in the mud during a rainstorm. C/o pain to L flank/low back and L rib cage with inspiration. Pt noticeably trembling in triage. States no ETOH intake since Tuesday as well. Hx alcoholism.

## 2019-05-26 NOTE — ED Notes (Signed)
Patient in Xray

## 2019-05-27 ENCOUNTER — Other Ambulatory Visit: Payer: Self-pay

## 2019-05-27 ENCOUNTER — Emergency Department: Payer: Medicaid Other

## 2019-05-27 ENCOUNTER — Inpatient Hospital Stay
Admission: EM | Admit: 2019-05-27 | Discharge: 2019-05-28 | DRG: 683 | Disposition: A | Discharge status: Home / Self Care | Payer: Medicaid Other | Attending: Internal Medicine | Admitting: Internal Medicine

## 2019-05-27 ENCOUNTER — Encounter: Payer: Self-pay | Admitting: Emergency Medicine

## 2019-05-27 DIAGNOSIS — N179 Acute kidney failure, unspecified: Principal | ICD-10-CM | POA: Diagnosis present

## 2019-05-27 DIAGNOSIS — N289 Disorder of kidney and ureter, unspecified: Secondary | ICD-10-CM

## 2019-05-27 DIAGNOSIS — K219 Gastro-esophageal reflux disease without esophagitis: Secondary | ICD-10-CM | POA: Diagnosis present

## 2019-05-27 DIAGNOSIS — Z79899 Other long term (current) drug therapy: Secondary | ICD-10-CM

## 2019-05-27 DIAGNOSIS — F429 Obsessive-compulsive disorder, unspecified: Secondary | ICD-10-CM | POA: Diagnosis present

## 2019-05-27 DIAGNOSIS — Z9049 Acquired absence of other specified parts of digestive tract: Secondary | ICD-10-CM

## 2019-05-27 DIAGNOSIS — F10129 Alcohol abuse with intoxication, unspecified: Secondary | ICD-10-CM | POA: Diagnosis present

## 2019-05-27 DIAGNOSIS — W1830XA Fall on same level, unspecified, initial encounter: Secondary | ICD-10-CM | POA: Diagnosis present

## 2019-05-27 DIAGNOSIS — E860 Dehydration: Secondary | ICD-10-CM | POA: Diagnosis present

## 2019-05-27 DIAGNOSIS — M109 Gout, unspecified: Secondary | ICD-10-CM | POA: Diagnosis present

## 2019-05-27 DIAGNOSIS — Z1159 Encounter for screening for other viral diseases: Secondary | ICD-10-CM

## 2019-05-27 DIAGNOSIS — Z7951 Long term (current) use of inhaled steroids: Secondary | ICD-10-CM

## 2019-05-27 DIAGNOSIS — S2242XA Multiple fractures of ribs, left side, initial encounter for closed fracture: Secondary | ICD-10-CM | POA: Diagnosis present

## 2019-05-27 DIAGNOSIS — Z87891 Personal history of nicotine dependence: Secondary | ICD-10-CM

## 2019-05-27 DIAGNOSIS — I10 Essential (primary) hypertension: Secondary | ICD-10-CM | POA: Diagnosis present

## 2019-05-27 DIAGNOSIS — I959 Hypotension, unspecified: Secondary | ICD-10-CM | POA: Diagnosis present

## 2019-05-27 DIAGNOSIS — F10929 Alcohol use, unspecified with intoxication, unspecified: Secondary | ICD-10-CM | POA: Diagnosis present

## 2019-05-27 DIAGNOSIS — J45909 Unspecified asthma, uncomplicated: Secondary | ICD-10-CM | POA: Diagnosis present

## 2019-05-27 DIAGNOSIS — F10920 Alcohol use, unspecified with intoxication, uncomplicated: Secondary | ICD-10-CM | POA: Diagnosis present

## 2019-05-27 LAB — COMPREHENSIVE METABOLIC PANEL
ALT: 12 U/L (ref 0–44)
AST: 12 U/L — ABNORMAL LOW (ref 15–41)
Albumin: 3.4 g/dL — ABNORMAL LOW (ref 3.5–5.0)
Alkaline Phosphatase: 58 U/L (ref 38–126)
Anion gap: 13 (ref 5–15)
BUN: 20 mg/dL (ref 6–20)
CO2: 20 mmol/L — ABNORMAL LOW (ref 22–32)
Calcium: 8.5 mg/dL — ABNORMAL LOW (ref 8.9–10.3)
Chloride: 105 mmol/L (ref 98–111)
Creatinine, Ser: 2.79 mg/dL — ABNORMAL HIGH (ref 0.61–1.24)
GFR calc Af Amer: 27 mL/min — ABNORMAL LOW (ref >60)
GFR calc non Af Amer: 24 mL/min — ABNORMAL LOW (ref >60)
Glucose, Bld: 152 mg/dL — ABNORMAL HIGH (ref 70–99)
Potassium: 2.8 mmol/L — ABNORMAL LOW (ref 3.5–5.1)
Sodium: 138 mmol/L (ref 135–145)
Total Bilirubin: 0.6 mg/dL (ref 0.3–1.2)
Total Protein: 5.9 g/dL — ABNORMAL LOW (ref 6.5–8.1)

## 2019-05-27 LAB — CBC
HCT: 37.5 % — ABNORMAL LOW (ref 39.0–52.0)
Hemoglobin: 11.7 g/dL — ABNORMAL LOW (ref 13.0–17.0)
MCH: 26.8 pg (ref 26.0–34.0)
MCHC: 31.2 g/dL (ref 30.0–36.0)
MCV: 86 fL (ref 80.0–100.0)
Platelets: 187 10*3/uL (ref 150–400)
RBC: 4.36 MIL/uL (ref 4.22–5.81)
RDW: 17.9 % — ABNORMAL HIGH (ref 11.5–15.5)
WBC: 7.6 10*3/uL (ref 4.0–10.5)
nRBC: 0 % (ref 0.0–0.2)

## 2019-05-27 LAB — ETHANOL: Alcohol, Ethyl (B): 222 mg/dL — ABNORMAL HIGH (ref <10)

## 2019-05-27 LAB — TROPONIN I (HIGH SENSITIVITY): Troponin I (High Sensitivity): 3 ng/L (ref <18)

## 2019-05-27 LAB — SARS CORONAVIRUS 2 BY RT PCR (HOSPITAL ORDER, PERFORMED IN ~~LOC~~ HOSPITAL LAB): SARS Coronavirus 2: NEGATIVE

## 2019-05-27 MED ORDER — LORAZEPAM 2 MG/ML IJ SOLN: 0.0000 mg | Freq: Two times a day (BID) | INTRAMUSCULAR | Status: DC

## 2019-05-27 MED ORDER — SODIUM CHLORIDE 0.9 % IV BOLUS
1000.0000 mL | Freq: Once | INTRAVENOUS | Status: AC
  Administered 2019-05-27: 1000 mL via INTRAVENOUS

## 2019-05-27 MED ORDER — THIAMINE HCL 100 MG/ML IJ SOLN: 100.0000 mg | Freq: Every day | INTRAMUSCULAR | Status: DC

## 2019-05-27 MED ORDER — LORAZEPAM 2 MG/ML IJ SOLN: 0.0000 mg | Freq: Four times a day (QID) | INTRAMUSCULAR | Status: DC

## 2019-05-27 MED ORDER — LORAZEPAM 2 MG PO TABS: 0.0000 mg | ORAL_TABLET | Freq: Two times a day (BID) | ORAL | Status: DC

## 2019-05-27 MED ORDER — VITAMIN B-1 100 MG PO TABS: 100.0000 mg | ORAL_TABLET | Freq: Every day | ORAL | Status: DC

## 2019-05-27 MED ORDER — LORAZEPAM 2 MG PO TABS: 0.0000 mg | ORAL_TABLET | Freq: Four times a day (QID) | ORAL | Status: DC

## 2019-05-27 NOTE — ED Notes (Signed)
Report from susan, rn. Pt sitting in bed in no acute distress, eating crackers.

## 2019-05-27 NOTE — ED Triage Notes (Signed)
Pt fell on Thursday - has been seen 2x for rib pain. Today states not feeling well and has blood in stool

## 2019-05-27 NOTE — ED Notes (Signed)
hospitalist in to see pt.

## 2019-05-27 NOTE — ED Provider Notes (Signed)
[facility] Emergency Department Provider Note  Time seen: 9:47 PM  I have reviewed the triage vital signs and the nursing notes.   HISTORY  Chief Complaint Chest Pain (from fall - negative rib fx)   HPI Miguel [name] is a 58 y.o. male with a past medical history of alcohol abuse, asthma, gastric reflux, hypertension, presents to the emergency department for a fall.  According to the patient 2 days ago he had a fall, continues to have left-sided chest pain after the fall, worse with any movement.  Patient was seen here yesterday for same had a negative x-ray.  EMS reports in route to the hospital patient was complaining of weakness as his chief complaint.  Here the patient denies any significant weakness.  Patient is noted to be hypotensive upon arrival 88/60.  Denies any fever cough congestion or shortness of breath but does state chest pain with movement.   Past Medical History:  Diagnosis Date  . Alcohol abuse   . Asthma   . GERD (gastroesophageal reflux disease)   . Gout   . Hypertension   . Kidney stone   . OCD (obsessive compulsive disorder)   . Renal colic     Patient Active Problem List   Diagnosis Date Noted  . Alcohol withdrawal ([place]) 05/07/2019  . Alcoholic intoxication with complication ([place])   . Sepsis ([place]) 03/08/2019  . Breast lump or mass 10/04/2018  . Sepsis secondary to UTI ([place]) 09/14/2018  . Asthma 07/07/2018  . GERD (gastroesophageal reflux disease) 07/07/2018  . OCD (obsessive compulsive disorder) 07/07/2018  . Self-inflicted laceration of wrist 03/10/2018  . Leg hematoma 12/25/2017  . Suicide and self-inflicted injury by cutting and piercing instrument ([place]) 03/10/2017  . Severe recurrent major 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 ([place]) 03/09/2017  . Substance induced mood disorder ([place]) 08/15/2016  . Involuntary commitment 08/15/2016  . Alcohol use disorder, severe, dependence ([place]) 02/05/2016  . Hypertension 12/05/2015  .  Tachycardia 12/05/2015  . Gout 11/13/2015  . Chronic back pain 05/02/2015    Past Surgical History:  Procedure Laterality Date  . CHOLECYSTECTOMY  2012  . EXTRACORPOREAL SHOCK WAVE LITHOTRIPSY Left 01/12/2019   Procedure: EXTRACORPOREAL SHOCK WAVE LITHOTRIPSY (ESWL);  Surgeon: Billey Co, MD;  Location: [facility] ORS;  Service: Urology;  Laterality: Left;    Prior to Admission medications   Medication Sig Start Date End Date Taking? Authorizing Provider  albuterol (VENTOLIN HFA) 108 (90 Base) MCG/ACT inhaler Inhale 2 puffs into the lungs every 4 (four) hours as needed. Patient taking differently: Inhale 2 puffs into the lungs every 4 (four) hours as needed for wheezing or shortness of breath.  09/01/18   Tukov-Yual, Arlyss Gandy, NP  allopurinol (ZYLOPRIM) 300 MG tablet Take 1 tablet (300 mg total) by mouth daily. 09/01/18   Tukov-Yual, Arlyss Gandy, NP  chlordiazePOXIDE (LIBRIUM) 25 MG capsule Take 25mg  three times a day for 2 days, then take 25mg  twice a day for 2 days, then take 25mg  once a day for 2 days 05/08/19   Mayo, Pete Pelt, MD  colchicine 0.6 MG tablet Take 1 tablet (0.6 mg total) by mouth daily. Patient taking differently: Take 0.6 mg by mouth daily as needed.  03/09/19   Lang Snow, NP  docusate sodium (COLACE) 100 MG capsule Take 1 tablet once or twice daily as needed for constipation while taking narcotic pain medicine 01/09/19   Hinda Kehr, MD  Fluticasone-Salmeterol (ADVAIR DISKUS) 100-50 MCG/DOSE AEPB Inhale 1 puff into the  lungs 2 (two) times daily. 09/01/18   Tukov-Yual, Arlyss Gandy, NP  folic acid (FOLVITE) 1 MG tablet Take 1 tablet (1 mg total) by mouth daily. 09/01/18   Tukov-Yual, Arlyss Gandy, NP  gabapentin (NEURONTIN) 100 MG capsule Take 2 capsules (200 mg total) by mouth 3 (three) times daily. 04/21/19   Patrecia Pour, NP  lisinopril (PRINIVIL,ZESTRIL) 20 MG tablet Take 1 tablet (20 mg total) by mouth daily. 02/21/19   Iloabachie, Chioma E, NP  Multiple  Vitamins-Minerals (MULTIVITAMIN WITH MINERALS) tablet Take 1 tablet by mouth daily. 09/01/18   Tukov-Yual, Arlyss Gandy, NP  naltrexone (DEPADE) 50 MG tablet Take 1 tablet (50 mg total) by mouth daily for 30 days. 05/08/19 06/07/19  Mayo, Pete Pelt, MD  promethazine (PHENERGAN) 12.5 MG tablet Take 1 tablet (12.5 mg total) by mouth every 6 (six) hours as needed. 05/17/19   Merlyn Lot, MD  tiZANidine (ZANAFLEX) 4 MG tablet Take 0.5 tablets (2 mg total) by mouth every 8 (eight) hours as needed for muscle spasms. 04/26/19   Iloabachie, Chioma E, NP    Allergies  Allergen Reactions  . Percocet [Oxycodone-Acetaminophen] Nausea And Vomiting    Family History  Problem Relation Age of Onset  . Alcohol abuse Father   . Breast cancer Mother 70    Social History Social History   Tobacco Use  . Smoking status: Former Research scientist (life sciences)  . Smokeless tobacco: Never Used  . Tobacco comment: quit 30 years ago  Substance Use Topics  . Alcohol use: Yes    Alcohol/week: 2.0 standard drinks    Types: 2 Glasses of wine per week    Comment: daily  . Drug use: No    Review of Systems Constitutional: Negative for fever. Cardiovascular: Positive for left chest pain with movement since a fall 2 days ago. Respiratory: Negative for shortness of breath.  Negative for cough. Gastrointestinal: Negative for abdominal pain, vomiting  Musculoskeletal: Negative for musculoskeletal complaints Neurological: Negative for headache All other ROS negative  ____________________________________________   PHYSICAL EXAM:  VITAL SIGNS: ED Triage Vitals  Enc Vitals Group     BP 05/27/19 2046 (!) 90/53     Pulse Rate 05/27/19 2046 88     Resp 05/27/19 2100 13     Temp --      Temp src --      SpO2 05/27/19 2100 91 %     Weight 05/27/19 2033 185 lb (83.9 kg)     Height 05/27/19 2033 6' (1.829 m)     Head Circumference --      Peak Flow --      Pain Score --      Pain Loc --      Pain Edu? --      Excl. in [place]? --     Constitutional: Alert and oriented. Well appearing and in no distress. Eyes: Normal exam ENT      Head: Normocephalic and atraumatic.      Mouth/Throat: Mucous membranes are moist. Cardiovascular: Normal rate, regular rhythm. No murmur Respiratory: Normal respiratory effort without tachypnea nor retractions. Breath sounds are clear Gastrointestinal: Soft and nontender. No distention. Musculoskeletal: Nontender with normal range of motion in all extremities.  Neurologic:  Normal speech and language. No gross focal neurologic deficits  Skin:  Skin is warm, dry and intact.  Psychiatric: Mood and affect are normal.   ____________________________________________    EKG  EKG viewed and interpreted by myself shows a normal sinus rhythm 85 bpm with a  narrow QRS, normal axis, normal intervals, no concerning ST changes.  ____________________________________________    RADIOLOGY  Negative chest x-ray  ____________________________________________   INITIAL IMPRESSION / ASSESSMENT AND PLAN / ED COURSE  Pertinent labs & imaging results that were available during my care of the patient were reviewed by me and considered in my medical decision making (see chart for details).   Patient presents emergency department initially for weakness, then reports left-sided chest pain since his fall 2 days ago.  Chest pain is very reproducible on examination.  We will repeat a chest x-ray.  Chest x-ray confirms no fracture or pneumonia.  Patient's lab work however shows he is in a degree of renal insufficiency, creatinine has elevated, this could explain the patient's hypotension or vice versa.  Patient has received 2 L of IV fluids with EMS.  We will dose 1/3 L of fluids in the emergency department.  Given the patient's renal insufficiency we will admit to the hospitalist service.  We will place on ciwa protocols. Patient agreeable to plan.  Miguel [name] was evaluated in Emergency Department on 05/27/2019  for the symptoms described in the history of present illness. He was evaluated in the context of the global COVID-19 pandemic, which necessitated consideration that the patient might be at risk for infection with the SARS-CoV-2 virus that causes COVID-19. Institutional protocols and algorithms that pertain to the evaluation of patients at risk for COVID-19 are in a state of rapid change based on information released by regulatory bodies including the CDC and federal and state organizations. These policies and algorithms were followed during the patient's care in the ED.  ____________________________________________   FINAL CLINICAL IMPRESSION(S) / ED DIAGNOSES  Acute renal insufficiency Hypotension   Harvest Dark, MD 05/27/19 2152

## 2019-05-27 NOTE — ED Notes (Signed)
Pt came in telling ems that he was having bloody stools. While talking to pt he stated that the bleeding was "a long time ago" and that he was here for his rib pain. He was seen yesterday for same and dx with bruised ribs and to take ibuprofen for pain. Pt is also requesting ativan. Pt has etoh on board. ciwa negative.

## 2019-05-27 NOTE — H&P (Signed)
[place] [facility] [place] NAME: Miguel [name]    MR#:  [id number]  DATE OF BIRTH:  Sep 05, 1961  DATE OF ADMISSION:  05/27/2019  PRIMARY CARE PHYSICIAN: System, Pcp Not In   REQUESTING/REFERRING PHYSICIAN: Kerman Passey, MD  CHIEF COMPLAINT:   Chief Complaint  Patient presents with  . Chest Pain    from fall - negative rib fx    HISTORY OF PRESENT ILLNESS:  Miguel [name]  is a 58 y.o. male who presents with chief complaint as above.  Patient presents the ED with a complaint of chest pain.  He states that he fell a couple of days ago and his left lower thoracic chest has been hurting since that time.  It is point tenderness, and worse on palpation.  Cardiac work-up in the ED is within normal limits.  However, lab evaluation showed acute kidney injury, and elevated alcohol level.  Patient is unable to give a clear idea about how much he has been drinking.  He was somewhat hypotensive when he first arrived as well.  Hospitalist called for admission and further treatment  PAST MEDICAL HISTORY:   Past Medical History:  Diagnosis Date  . Alcohol abuse   . Asthma   . GERD (gastroesophageal reflux disease)   . Gout   . Hypertension   . Kidney stone   . OCD (obsessive compulsive disorder)   . Renal colic      PAST SURGICAL HISTORY:   Past Surgical History:  Procedure Laterality Date  . CHOLECYSTECTOMY  2012  . EXTRACORPOREAL SHOCK WAVE LITHOTRIPSY Left 01/12/2019   Procedure: EXTRACORPOREAL SHOCK WAVE LITHOTRIPSY (ESWL);  Surgeon: Billey Co, MD;  Location: [facility] ORS;  Service: Urology;  Laterality: Left;     SOCIAL HISTORY:   Social History   Tobacco Use  . Smoking status: Former Research scientist (life sciences)  . Smokeless tobacco: Never Used  . Tobacco comment: quit 30 years ago  Substance Use Topics  . Alcohol use: Yes    Alcohol/week: 2.0 standard drinks    Types: 2 Glasses of wine per week    Comment: daily     FAMILY HISTORY:   Family  History  Problem Relation Age of Onset  . Alcohol abuse Father   . Breast cancer Mother 43     DRUG ALLERGIES:   Allergies  Allergen Reactions  . Percocet [Oxycodone-Acetaminophen] Nausea And Vomiting    MEDICATIONS AT HOME:   Prior to Admission medications   Medication Sig Start Date End Date Taking? Authorizing Provider  albuterol (VENTOLIN HFA) 108 (90 Base) MCG/ACT inhaler Inhale 2 puffs into the lungs every 4 (four) hours as needed. Patient taking differently: Inhale 2 puffs into the lungs every 4 (four) hours as needed for wheezing or shortness of breath.  09/01/18   Tukov-Yual, Arlyss Gandy, NP  allopurinol (ZYLOPRIM) 300 MG tablet Take 1 tablet (300 mg total) by mouth daily. 09/01/18   Tukov-Yual, Arlyss Gandy, NP  chlordiazePOXIDE (LIBRIUM) 25 MG capsule Take 25mg  three times a day for 2 days, then take 25mg  twice a day for 2 days, then take 25mg  once a day for 2 days 05/08/19   Mayo, Pete Pelt, MD  colchicine 0.6 MG tablet Take 1 tablet (0.6 mg total) by mouth daily. Patient taking differently: Take 0.6 mg by mouth daily as needed.  03/09/19   Lang Snow, NP  docusate sodium (COLACE) 100 MG capsule Take 1 tablet once or twice daily as needed for constipation while  taking narcotic pain medicine 01/09/19   Hinda Kehr, MD  Fluticasone-Salmeterol (ADVAIR DISKUS) 100-50 MCG/DOSE AEPB Inhale 1 puff into the lungs 2 (two) times daily. 09/01/18   Tukov-Yual, Arlyss Gandy, NP  folic acid (FOLVITE) 1 MG tablet Take 1 tablet (1 mg total) by mouth daily. 09/01/18   Tukov-Yual, Arlyss Gandy, NP  gabapentin (NEURONTIN) 100 MG capsule Take 2 capsules (200 mg total) by mouth 3 (three) times daily. 04/21/19   Patrecia Pour, NP  lisinopril (PRINIVIL,ZESTRIL) 20 MG tablet Take 1 tablet (20 mg total) by mouth daily. 02/21/19   Iloabachie, Chioma E, NP  Multiple Vitamins-Minerals (MULTIVITAMIN WITH MINERALS) tablet Take 1 tablet by mouth daily. 09/01/18   Tukov-Yual, Arlyss Gandy, NP   naltrexone (DEPADE) 50 MG tablet Take 1 tablet (50 mg total) by mouth daily for 30 days. 05/08/19 06/07/19  Mayo, Pete Pelt, MD  promethazine (PHENERGAN) 12.5 MG tablet Take 1 tablet (12.5 mg total) by mouth every 6 (six) hours as needed. 05/17/19   Merlyn Lot, MD  tiZANidine (ZANAFLEX) 4 MG tablet Take 0.5 tablets (2 mg total) by mouth every 8 (eight) hours as needed for muscle spasms. 04/26/19   Iloabachie, Chioma E, NP    REVIEW OF SYSTEMS:  Review of Systems  Constitutional: Negative for chills, fever, malaise/fatigue and weight loss.  HENT: Negative for ear pain, hearing loss and tinnitus.   Eyes: Negative for blurred vision, double vision, pain and redness.  Respiratory: Negative for cough, hemoptysis and shortness of breath.   Cardiovascular: Positive for chest pain. Negative for palpitations, orthopnea and leg swelling.  Gastrointestinal: Negative for abdominal pain, constipation, diarrhea, nausea and vomiting.  Genitourinary: Negative for dysuria, frequency and hematuria.  Musculoskeletal: Positive for falls. Negative for back pain, joint pain and neck pain.  Skin:       No acne, rash, or lesions  Neurological: Positive for tremors. Negative for dizziness, focal weakness and weakness.  Endo/Heme/Allergies: Negative for polydipsia. Does not bruise/bleed easily.  Psychiatric/Behavioral: Negative for depression. The patient is not nervous/anxious and does not have insomnia.      VITAL SIGNS:   Vitals:   05/27/19 2130 05/27/19 2200 05/27/19 2256 05/27/19 2304  BP: 102/65 (!) 106/59 110/73   Pulse:   84 98  Resp:      SpO2:   96% 93%  Weight:      Height:       Wt Readings from Last 3 Encounters:  05/27/19 83.9 kg  05/26/19 83.9 kg  05/10/19 83.9 kg    PHYSICAL EXAMINATION:  Physical Exam  Vitals reviewed. Constitutional: He is oriented to person, place, and time. He appears well-developed and well-nourished. No distress.  HENT:  Head: Normocephalic and atraumatic.   Dry mucous membranes  Eyes: Pupils are equal, round, and reactive to light. Conjunctivae and EOM are normal. No scleral icterus.  Neck: Normal range of motion. Neck supple. No JVD present. No thyromegaly present.  Cardiovascular: Normal rate, regular rhythm and intact distal pulses. Exam reveals no gallop and no friction rub.  No murmur heard. Respiratory: Effort normal and breath sounds normal. No respiratory distress. He has no wheezes. He has no rales.  GI: Soft. Bowel sounds are normal. He exhibits no distension. There is no abdominal tenderness.  Musculoskeletal: Normal range of motion.        General: No edema.     Comments: No arthritis, no gout  Lymphadenopathy:    He has no cervical adenopathy.  Neurological: He is alert and oriented  to person, place, and time. No cranial nerve deficit.  No dysarthria, no aphasia, mild tremor in bilateral hands  Skin: Skin is warm and dry. No rash noted. No erythema.  Psychiatric: He has a normal mood and affect. His behavior is normal. Judgment and thought content normal.    LABORATORY PANEL:   CBC Recent Labs  Lab 05/27/19 2032  WBC 7.6  HGB 11.7*  HCT 37.5*  PLT 187   ------------------------------------------------------------------------------------------------------------------  Chemistries  Recent Labs  Lab 05/27/19 2032  NA 138  K 2.8*  CL 105  CO2 20*  GLUCOSE 152*  BUN 20  CREATININE 2.79*  CALCIUM 8.5*  AST 12*  ALT 12  ALKPHOS 58  BILITOT 0.6   ------------------------------------------------------------------------------------------------------------------  Cardiac Enzymes No results for input(s): TROPONINI in the last 168 hours. ------------------------------------------------------------------------------------------------------------------  RADIOLOGY:  Dg Chest 2 View  Result Date: 05/26/2019 CLINICAL DATA:  Fall 2 days ago.  Left anterior chest pain. EXAM: CHEST - 2 VIEW COMPARISON:  Radiographs  03/20/2019 and 03/09/2019.  CT 03/08/2019. FINDINGS: The heart size and mediastinal contours are stable. There is improved aeration of both lung bases with minimal residual atelectasis or scarring in the left lower lobe. There is no pleural effusion or pneumothorax. No acute osseous findings are seen. IMPRESSION: No evidence of active cardiopulmonary process. Interval improved aeration of the lung bases. Electronically Signed   By: Richardean Sale M.D.   On: 05/26/2019 14:15   Dg Ribs Unilateral Left  Result Date: 05/26/2019 CLINICAL DATA:  Fall 2 days ago. Left anterior chest pain. EXAM: LEFT RIBS - 2 VIEW COMPARISON:  Chest radiographs today and 03/20/2019. FINDINGS: No evidence of acute left-sided rib fracture, pleural effusion or pneumothorax. The aeration of the left lung base has improved with mild residual atelectasis or scarring. There are possible old fractures of the left 7th and 8th ribs anteriorly, best seen on the oblique view. IMPRESSION: No definite acute rib fracture, pleural effusion or pneumothorax. Possible fractures of the left 7th and 8th ribs anteriorly. Electronically Signed   By: Richardean Sale M.D.   On: 05/26/2019 14:17   Dg Chest Portable 1 View  Result Date: 05/27/2019 CLINICAL DATA:  Chest pain, hypoxia EXAM: PORTABLE CHEST 1 VIEW COMPARISON:  05/26/2019, 03/20/2019, 03/09/2019 FINDINGS: No acute airspace disease or pleural effusion. Minimal atelectasis or scar at the left base. Stable cardiomediastinal silhouette. No pneumothorax. IMPRESSION: No active disease.  Minimal atelectasis or scar at the left base Electronically Signed   By: Donavan Foil M.D.   On: 05/27/2019 20:49    EKG:   Orders placed or performed during the hospital encounter of 05/27/19  . ED EKG  . ED EKG  . EKG 12-Lead  . EKG 12-Lead    IMPRESSION AND PLAN:  Principal Problem:   AKI (acute kidney injury) ([place]) -suspect the patient has had significant alcohol intake with poor p.o. intake over the  last few days.  We will hydrate him with fluids, avoid nephrotoxins and monitor for expected improvement Active Problems:   Alcoholic intoxication with complication (HCC) -scheduled Librium, CIWA protocol   Hypertension -home dose antihypertensives   Asthma -home dose inhalers   GERD (gastroesophageal reflux disease) -home dose PPI  Chart review performed and case discussed with ED provider. Labs, imaging and/or ECG reviewed by provider and discussed with patient/family. Management plans discussed with the patient and/or family.  COVID-19 status: Tested negative     DVT PROPHYLAXIS: SubQ heparin  GI PROPHYLAXIS:  PPI   ADMISSION  STATUS: Inpatient     CODE STATUS: Full Code Status History    Date Active Date Inactive Code Status Order ID Comments User Context   05/07/2019 2328 05/08/2019 1641 Full Code [id number]  Mayer Camel, NP ED   03/08/2019 0753 03/09/2019 2021 Full Code [id number]  Lang Snow, NP ED   09/14/2018 0303 09/15/2018 1923 Full Code [id number]  Arta Silence, MD Inpatient   12/25/2017 1405 12/29/2017 1856 Full Code [id number]  Gladstone Lighter, MD Inpatient   03/09/2017 2107 03/12/2017 1506 Full Code [id number]  Gonzella Lex, MD Inpatient   08/15/2016 0009 08/15/2016 1528 Full Code [id number]  Nena Polio, MD ED   Advance Care Planning Activity      TOTAL TIME TAKING CARE OF THIS PATIENT: 45 minutes.   This patient was evaluated in the context of the global COVID-19 pandemic, which necessitated consideration that the patient might be at risk for infection with the SARS-CoV-2 virus that causes COVID-19. Institutional protocols and algorithms that pertain to the evaluation of patients at risk for COVID-19 are in a state of rapid change based on information released by regulatory bodies including the CDC and federal and state organizations. These policies and algorithms were followed to the best of this provider's knowledge to date during the patient's  care at this facility.  Ethlyn Daniels 05/27/2019, 11:17 PM  [facility]  Office  [phone]  CC: Primary care physician; System, Pcp Not In  Note:  This document was prepared using Dragon voice recognition software and may include unintentional dictation errors.

## 2019-05-28 ENCOUNTER — Emergency Department
Admission: EM | Admit: 2019-05-28 | Discharge: 2019-05-28 | Disposition: A | Discharge status: Home / Self Care | Payer: Self-pay | Attending: Emergency Medicine | Admitting: Emergency Medicine

## 2019-05-28 ENCOUNTER — Other Ambulatory Visit: Payer: Self-pay

## 2019-05-28 ENCOUNTER — Encounter: Payer: Self-pay | Admitting: Emergency Medicine

## 2019-05-28 DIAGNOSIS — Z87891 Personal history of nicotine dependence: Secondary | ICD-10-CM | POA: Insufficient documentation

## 2019-05-28 DIAGNOSIS — I10 Essential (primary) hypertension: Secondary | ICD-10-CM | POA: Insufficient documentation

## 2019-05-28 DIAGNOSIS — Z79899 Other long term (current) drug therapy: Secondary | ICD-10-CM | POA: Insufficient documentation

## 2019-05-28 DIAGNOSIS — J45909 Unspecified asthma, uncomplicated: Secondary | ICD-10-CM | POA: Insufficient documentation

## 2019-05-28 DIAGNOSIS — M25561 Pain in right knee: Secondary | ICD-10-CM | POA: Insufficient documentation

## 2019-05-28 LAB — CBC
HCT: 38.7 % — ABNORMAL LOW (ref 39.0–52.0)
Hemoglobin: 11.9 g/dL — ABNORMAL LOW (ref 13.0–17.0)
MCH: 26.8 pg (ref 26.0–34.0)
MCHC: 30.7 g/dL (ref 30.0–36.0)
MCV: 87.2 fL (ref 80.0–100.0)
Platelets: 199 10*3/uL (ref 150–400)
RBC: 4.44 MIL/uL (ref 4.22–5.81)
RDW: 18.2 % — ABNORMAL HIGH (ref 11.5–15.5)
WBC: 5.8 10*3/uL (ref 4.0–10.5)
nRBC: 0 % (ref 0.0–0.2)

## 2019-05-28 LAB — BASIC METABOLIC PANEL
Anion gap: 7 (ref 5–15)
BUN: 16 mg/dL (ref 6–20)
CO2: 27 mmol/L (ref 22–32)
Calcium: 8.8 mg/dL — ABNORMAL LOW (ref 8.9–10.3)
Chloride: 113 mmol/L — ABNORMAL HIGH (ref 98–111)
Creatinine, Ser: 1.77 mg/dL — ABNORMAL HIGH (ref 0.61–1.24)
GFR calc Af Amer: 48 mL/min — ABNORMAL LOW (ref >60)
GFR calc non Af Amer: 41 mL/min — ABNORMAL LOW (ref >60)
Glucose, Bld: 120 mg/dL — ABNORMAL HIGH (ref 70–99)
Potassium: 3.5 mmol/L (ref 3.5–5.1)
Sodium: 147 mmol/L — ABNORMAL HIGH (ref 135–145)

## 2019-05-28 LAB — CK: Total CK: 25 U/L — ABNORMAL LOW (ref 49–397)

## 2019-05-28 MED ORDER — ACETAMINOPHEN 325 MG PO TABS
650.0000 mg | ORAL_TABLET | Freq: Four times a day (QID) | ORAL | Status: DC | PRN
  Administered 2019-05-28: 650 mg via ORAL
  Filled 2019-05-28: qty 2

## 2019-05-28 MED ORDER — MOMETASONE FURO-FORMOTEROL FUM 100-5 MCG/ACT IN AERO
2.0000 | INHALATION_SPRAY | Freq: Two times a day (BID) | RESPIRATORY_TRACT | Status: DC
  Administered 2019-05-28: 2 via RESPIRATORY_TRACT
  Filled 2019-05-28: qty 8.8

## 2019-05-28 MED ORDER — LORAZEPAM 2 MG/ML IJ SOLN
0.0000 mg | Freq: Four times a day (QID) | INTRAMUSCULAR | Status: DC
  Administered 2019-05-28: 2 mg via INTRAVENOUS
  Filled 2019-05-28: qty 1

## 2019-05-28 MED ORDER — CHLORDIAZEPOXIDE HCL 25 MG PO CAPS
25.0000 mg | ORAL_CAPSULE | Freq: Three times a day (TID) | ORAL | Status: DC
  Administered 2019-05-28 (×3): 25 mg via ORAL
  Filled 2019-05-28 (×3): qty 1

## 2019-05-28 MED ORDER — OXYCODONE HCL 5 MG PO TABS: 5.0000 mg | ORAL_TABLET | Freq: Four times a day (QID) | ORAL | 0 refills | Status: DC | PRN

## 2019-05-28 MED ORDER — ONDANSETRON HCL 4 MG/2ML IJ SOLN: 4.0000 mg | Freq: Four times a day (QID) | INTRAMUSCULAR | Status: DC | PRN

## 2019-05-28 MED ORDER — ACETAMINOPHEN 650 MG RE SUPP: 650.0000 mg | Freq: Four times a day (QID) | RECTAL | Status: DC | PRN

## 2019-05-28 MED ORDER — LORAZEPAM 2 MG/ML IJ SOLN: 0.0000 mg | Freq: Two times a day (BID) | INTRAMUSCULAR | Status: DC

## 2019-05-28 MED ORDER — POTASSIUM CHLORIDE IN NACL 20-0.45 MEQ/L-% IV SOLN
INTRAVENOUS | Status: DC
  Administered 2019-05-28: 09:00:00 via INTRAVENOUS
  Filled 2019-05-28 (×4): qty 1000

## 2019-05-28 MED ORDER — IBUPROFEN 400 MG PO TABS
400.0000 mg | ORAL_TABLET | Freq: Once | ORAL | Status: AC
  Administered 2019-05-28: 400 mg via ORAL
  Filled 2019-05-28: qty 1

## 2019-05-28 MED ORDER — FOLIC ACID 1 MG PO TABS
1.0000 mg | ORAL_TABLET | Freq: Every day | ORAL | Status: DC
  Administered 2019-05-28: 1 mg via ORAL
  Filled 2019-05-28: qty 1

## 2019-05-28 MED ORDER — THIAMINE HCL 100 MG/ML IJ SOLN: 100.0000 mg | Freq: Every day | INTRAMUSCULAR | Status: DC

## 2019-05-28 MED ORDER — VITAMIN B-1 100 MG PO TABS
100.0000 mg | ORAL_TABLET | Freq: Every day | ORAL | Status: DC
  Administered 2019-05-28: 100 mg via ORAL
  Filled 2019-05-28: qty 1

## 2019-05-28 MED ORDER — MORPHINE SULFATE (PF) 2 MG/ML IV SOLN
2.0000 mg | INTRAVENOUS | Status: DC | PRN
  Administered 2019-05-28: 2 mg via INTRAVENOUS
  Filled 2019-05-28: qty 1

## 2019-05-28 MED ORDER — HEPARIN SODIUM (PORCINE) 5000 UNIT/ML IJ SOLN
5000.0000 [IU] | Freq: Three times a day (TID) | INTRAMUSCULAR | Status: DC
  Administered 2019-05-28: 5000 [IU] via SUBCUTANEOUS
  Filled 2019-05-28: qty 1

## 2019-05-28 MED ORDER — NITROGLYCERIN 0.4 MG SL SUBL
0.4000 mg | SUBLINGUAL_TABLET | SUBLINGUAL | Status: DC | PRN
  Administered 2019-05-28: 0.4 mg via SUBLINGUAL
  Filled 2019-05-28: qty 1

## 2019-05-28 MED ORDER — LORAZEPAM 2 MG/ML IJ SOLN: 1.0000 mg | Freq: Four times a day (QID) | INTRAMUSCULAR | Status: DC | PRN

## 2019-05-28 MED ORDER — ONDANSETRON HCL 4 MG PO TABS: 4.0000 mg | ORAL_TABLET | Freq: Four times a day (QID) | ORAL | Status: DC | PRN

## 2019-05-28 MED ORDER — TRAMADOL HCL 50 MG PO TABS
50.0000 mg | ORAL_TABLET | Freq: Four times a day (QID) | ORAL | Status: DC | PRN
  Administered 2019-05-28: 50 mg via ORAL
  Filled 2019-05-28: qty 1

## 2019-05-28 MED ORDER — LORAZEPAM 1 MG PO TABS: 1.0000 mg | ORAL_TABLET | Freq: Four times a day (QID) | ORAL | Status: DC | PRN

## 2019-05-28 MED ORDER — SODIUM CHLORIDE 0.9 % IV SOLN
INTRAVENOUS | Status: DC
  Administered 2019-05-28 (×2): via INTRAVENOUS

## 2019-05-28 MED ORDER — OXYCODONE HCL 5 MG PO TABS
5.0000 mg | ORAL_TABLET | Freq: Four times a day (QID) | ORAL | Status: DC | PRN
  Administered 2019-05-28: 5 mg via ORAL
  Filled 2019-05-28: qty 1

## 2019-05-28 MED ORDER — ADULT MULTIVITAMIN W/MINERALS CH
1.0000 | ORAL_TABLET | Freq: Every day | ORAL | Status: DC
  Administered 2019-05-28: 1 via ORAL
  Filled 2019-05-28: qty 1

## 2019-05-28 NOTE — ED Provider Notes (Signed)
[facility] Emergency Department Provider Note   ____________________________________________    I have reviewed the triage vital signs and the nursing notes.   HISTORY  Chief Complaint Knee Pain     HPI Miguel [name] is a 58 y.o. male recently discharged from the hospital waiting for taxicab who became impatient and decided to check back in because his knee is sore.  He is worried that it could be gout.  He reports the pain is mild.  No injury.  He is able to walk on it although it does hurt somewhat.  He does not take anything for this.  Past Medical History:  Diagnosis Date  . Alcohol abuse   . Asthma   . GERD (gastroesophageal reflux disease)   . Gout   . Hypertension   . Kidney stone   . OCD (obsessive compulsive disorder)   . Renal colic     Patient Active Problem List   Diagnosis Date Noted  . AKI (acute kidney injury) ([place]) 05/27/2019  . Alcohol withdrawal ([place]) 05/07/2019  . Alcoholic intoxication with complication ([place])   . Sepsis ([place]) 03/08/2019  . Breast lump or mass 10/04/2018  . Sepsis secondary to UTI ([place]) 09/14/2018  . Asthma 07/07/2018  . GERD (gastroesophageal reflux disease) 07/07/2018  . OCD (obsessive compulsive disorder) 07/07/2018  . Self-inflicted laceration of wrist 03/10/2018  . Leg hematoma 12/25/2017  . Suicide and self-inflicted injury by cutting and piercing instrument ([place]) 03/10/2017  . Severe recurrent major 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 ([place]) 03/09/2017  . Substance induced mood disorder ([place]) 08/15/2016  . Involuntary commitment 08/15/2016  . Alcohol use disorder, severe, dependence ([place]) 02/05/2016  . Hypertension 12/05/2015  . Tachycardia 12/05/2015  . Gout 11/13/2015  . Chronic back pain 05/02/2015    Past Surgical History:  Procedure Laterality Date  . CHOLECYSTECTOMY  2012  . EXTRACORPOREAL SHOCK WAVE LITHOTRIPSY Left 01/12/2019   Procedure: EXTRACORPOREAL SHOCK WAVE LITHOTRIPSY  (ESWL);  Surgeon: Billey Co, MD;  Location: [facility] ORS;  Service: Urology;  Laterality: Left;    Prior to Admission medications   Medication Sig Start Date End Date Taking? Authorizing Provider  albuterol (VENTOLIN HFA) 108 (90 Base) MCG/ACT inhaler Inhale 2 puffs into the lungs every 4 (four) hours as needed. Patient taking differently: Inhale 2 puffs into the lungs every 4 (four) hours as needed for wheezing or shortness of breath.  09/01/18   Tukov-Yual, Arlyss Gandy, NP  allopurinol (ZYLOPRIM) 300 MG tablet Take 1 tablet (300 mg total) by mouth daily. 09/01/18   Tukov-Yual, Arlyss Gandy, NP  colchicine 0.6 MG tablet Take 1 tablet (0.6 mg total) by mouth daily. Patient taking differently: Take 0.6 mg by mouth daily as needed.  03/09/19   Lang Snow, NP  docusate sodium (COLACE) 100 MG capsule Take 1 tablet once or twice daily as needed for constipation while taking narcotic pain medicine 01/09/19   Hinda Kehr, MD  Fluticasone-Salmeterol (ADVAIR DISKUS) 100-50 MCG/DOSE AEPB Inhale 1 puff into the lungs 2 (two) times daily. 09/01/18   Tukov-Yual, Arlyss Gandy, NP  folic acid (FOLVITE) 1 MG tablet Take 1 tablet (1 mg total) by mouth daily. 09/01/18   Tukov-Yual, Arlyss Gandy, NP  gabapentin (NEURONTIN) 100 MG capsule Take 2 capsules (200 mg total) by mouth 3 (three) times daily. 04/21/19   Patrecia Pour, NP  lisinopril (PRINIVIL,ZESTRIL) 20 MG tablet Take 1 tablet (20 mg total) by mouth daily. 02/21/19   Iloabachie, Chioma E, NP  Multiple Vitamins-Minerals (MULTIVITAMIN WITH MINERALS) tablet Take 1 tablet by mouth daily. 09/01/18   Tukov-Yual, Arlyss Gandy, NP  naltrexone (DEPADE) 50 MG tablet Take 1 tablet (50 mg total) by mouth daily for 30 days. 05/08/19 06/07/19  Mayo, Pete Pelt, MD  oxyCODONE (OXY IR/ROXICODONE) 5 MG immediate release tablet Take 1 tablet (5 mg total) by mouth every 6 (six) hours as needed for severe pain. 05/28/19   Hillary Bow, MD  promethazine (PHENERGAN) 12.5  MG tablet Take 1 tablet (12.5 mg total) by mouth every 6 (six) hours as needed. 05/17/19   Merlyn Lot, MD  tiZANidine (ZANAFLEX) 4 MG tablet Take 0.5 tablets (2 mg total) by mouth every 8 (eight) hours as needed for muscle spasms. 04/26/19   Iloabachie, Chioma E, NP     Allergies Patient has no known allergies.  Family History  Problem Relation Age of Onset  . Alcohol abuse Father   . Breast cancer Mother 67    Social History Social History   Tobacco Use  . Smoking status: Former Research scientist (life sciences)  . Smokeless tobacco: Never Used  . Tobacco comment: quit 30 years ago  Substance Use Topics  . Alcohol use: Yes    Alcohol/week: 2.0 standard drinks    Types: 2 Glasses of wine per week    Comment: daily  . Drug use: No    Review of Systems  Constitutional: No fever/chills      Musculoskeletal: As above Skin: No erythema     ____________________________________________   PHYSICAL EXAM:  VITAL SIGNS: ED Triage Vitals  Enc Vitals Group     BP 05/28/19 1913 (!) 129/95     Pulse Rate 05/28/19 1913 93     Resp 05/28/19 1913 20     Temp 05/28/19 1913 98.6 F (37 C)     Temp Source 05/28/19 1913 Oral     SpO2 05/28/19 1913 98 %     Weight 05/28/19 1914 83.9 kg (185 lb)     Height 05/28/19 1914 1.829 m (6')     Head Circumference --      Peak Flow --      Pain Score 05/28/19 1916 9     Pain Loc --      Pain Edu? --      Excl. in [place]? --      Constitutional: Alert and oriented.         Musculoskeletal: Normal exam of the right knee, no tenderness, no pain with axial load, full range of motion.  No erythema or swelling or effusion, 2+ distal pulses Neurologic:  Normal speech and language. No gross focal neurologic deficits are appreciated.   Skin:  Skin is warm, dry and intact.  No erythema   ____________________________________________   LABS (all labs ordered are listed, but only abnormal results are displayed)  Labs Reviewed - No data to display  ____________________________________________  EKG   ____________________________________________  RADIOLOGY  None ____________________________________________   PROCEDURES  Procedure(s) performed: No  Procedures   Critical Care performed: No ____________________________________________   INITIAL IMPRESSION / ASSESSMENT AND PLAN / ED COURSE  Pertinent labs & imaging results that were available during my care of the patient were reviewed by me and considered in my medical decision making (see chart for details).  Patient well-appearing in no acute distress, frequent visitor to our emergency department, he admits that he was impatient waiting for taxicab and decided to check back in.  We will treat his mild knee pain with Motrin, recommend  outpatient follow-up PCP.  No evidence of infection gout effusion or injury.   ____________________________________________   FINAL CLINICAL IMPRESSION(S) / ED DIAGNOSES  Final diagnoses:  Acute pain of right knee      NEW MEDICATIONS STARTED DURING THIS VISIT:  New Prescriptions   No medications on file     Note:  This document was prepared using Dragon voice recognition software and may include unintentional dictation errors.   Lavonia Drafts, MD 05/28/19 1944

## 2019-05-28 NOTE — TOC Transition Note (Signed)
Transition of Care [facility]) - CM/SW Discharge Note   Patient Details  Name: Miguel [name] MRN: [id number] Date of Birth: [birth year]  [facility] CM/SW Contact:  Latanya Maudlin, RN Phone Number: 05/28/2019, 11:22 AM   Clinical Narrative:  TOC consulted to complete high risk readmission assessment. Patient from home with his spouse. Patient was recently discharged and was admitted to behavioral health after following with his PCP. Follows at [facility]. Obtained medications from medication management. Independent with activities of daily living. Patient and spouse able to drive.         Barriers to Discharge: Continued Medical Work up   Patient Goals and CMS Choice        Discharge Placement                       Discharge Plan and Services   Discharge Planning Services: CM Consult, [facility]                                 Social Determinants of Health (SDOH) Interventions     Readmission Risk Interventions Readmission Risk Prevention Plan 05/28/2019 05/08/2019 03/09/2019  Transportation Screening Complete Complete Complete  Medication Review Press photographer) Complete Complete Complete  PCP or Specialist appointment within 3-5 days of discharge - Complete Complete  HRI or Home Care Consult Complete (No Data) Not Complete  HRI or Home Care Consult Pt Refusal Comments - - not indicated   SW Recovery Care/Counseling Consult - - Not Complete  SW Consult Not Complete Comments - - not indicated   Palliative Care Screening Not Applicable Not Applicable Not Applicable  Skilled Nursing Facility Not Applicable Not Applicable Not Applicable  Some recent data might be hidden

## 2019-05-28 NOTE — ED Notes (Signed)
Patient discharged from upstairs about two hours ago. Checking in for knee pain because he is "tired of waiting for the taxi".

## 2019-05-28 NOTE — ED Notes (Signed)
ED TO INPATIENT HANDOFF REPORT  ED Nurse Name and Phone #: Tiyon Sanor [phone]   S Name/Age/Gender Miguel [name] 58 y.o. male Room/Bed: ED15A/ED15A  Code Status   Code Status: Prior  Home/SNF/Other Home Patient oriented to: self and situation Is this baseline? No      Chief Complaint blood in stool  Triage Note Pt fell on Thursday - has been seen 2x for rib pain. Today states not feeling well and has blood in stool   Allergies Allergies  Allergen Reactions  . Percocet [Oxycodone-Acetaminophen] Nausea And Vomiting    Level of Care/Admitting Diagnosis ED Disposition    ED Disposition Condition [facility] Area: [place] [[id number]]  Level of Care: Med-Surg [16]  Covid Evaluation: Confirmed COVID Negative  Diagnosis: AKI (acute kidney injury) [facility]) [[id number]]  Admitting Physician: Lance Coon [[id number]]  Attending Physician: Lance Coon [phone]  Estimated length of stay: past midnight tomorrow  Certification:: I certify this patient will need inpatient services for at least 2 midnights  PT Class (Do Not Modify): Inpatient [101]  PT Acc Code (Do Not Modify): Private [1]       B Medical/Surgery History Past Medical History:  Diagnosis Date  . Alcohol abuse   . Asthma   . GERD (gastroesophageal reflux disease)   . Gout   . Hypertension   . Kidney stone   . OCD (obsessive compulsive disorder)   . Renal colic    Past Surgical History:  Procedure Laterality Date  . CHOLECYSTECTOMY  2012  . EXTRACORPOREAL SHOCK WAVE LITHOTRIPSY Left 01/12/2019   Procedure: EXTRACORPOREAL SHOCK WAVE LITHOTRIPSY (ESWL);  Surgeon: Billey Co, MD;  Location: [facility] ORS;  Service: Urology;  Laterality: Left;     A IV Location/Drains/Wounds Patient Lines/Drains/Airways Status   Active Line/Drains/Airways    Name:   Placement date:   Placement time:   Site:   Days:   Peripheral IV 05/27/19 Right Antecubital   05/27/19    -    Antecubital   1    Wound / Incision (Open or Dehisced) Leg Right   -    -    Leg             Intake/Output Last 24 hours No intake or output data in the 24 hours ending 05/28/19 0000  Labs/Imaging Results for orders placed or performed during the hospital encounter of 05/27/19 (from the past 48 hour(s))  CBC     Status: Abnormal   Collection Time: 05/27/19  8:32 PM  Result Value Ref Range   WBC 7.6 4.0 - 10.5 K/uL   RBC 4.36 4.22 - 5.81 MIL/uL   Hemoglobin 11.7 (L) 13.0 - 17.0 g/dL   HCT 37.5 (L) 39.0 - 52.0 %   MCV 86.0 80.0 - 100.0 fL   MCH 26.8 26.0 - 34.0 pg   MCHC 31.2 30.0 - 36.0 g/dL   RDW 17.9 (H) 11.5 - 15.5 %   Platelets 187 150 - 400 K/uL   nRBC 0.0 0.0 - 0.2 %    Comment: Performed at [facility], [address] [place], [place] [address]  Comprehensive metabolic panel     Status: Abnormal   Collection Time: 05/27/19  8:32 PM  Result Value Ref Range   Sodium 138 135 - 145 mmol/L   Potassium 2.8 (L) 3.5 - 5.1 mmol/L   Chloride 105 98 - 111 mmol/L   CO2 20 (L) 22 - 32 mmol/L   Glucose, Bld  152 (H) 70 - 99 mg/dL   BUN 20 6 - 20 mg/dL   Creatinine, Ser 2.79 (H) 0.61 - 1.24 mg/dL   Calcium 8.5 (L) 8.9 - 10.3 mg/dL   Total Protein 5.9 (L) 6.5 - 8.1 g/dL   Albumin 3.4 (L) 3.5 - 5.0 g/dL   AST 12 (L) 15 - 41 U/L   ALT 12 0 - 44 U/L   Alkaline Phosphatase 58 38 - 126 U/L   Total Bilirubin 0.6 0.3 - 1.2 mg/dL   GFR calc non Af Amer 24 (L) >60 mL/min   GFR calc Af Amer 27 (L) >60 mL/min   Anion gap 13 5 - 15    Comment: Performed at [facility], [address], [place], [place] [address]  Ethanol     Status: Abnormal   Collection Time: 05/27/19  8:32 PM  Result Value Ref Range   Alcohol, Ethyl (B) 222 (H) <10 mg/dL    Comment: (NOTE) Lowest detectable limit for serum alcohol is 10 mg/dL. For medical purposes only. Performed at [facility], [place], [place] [address]   Troponin I (High Sensitivity)     Status: None   Collection  Time: 05/27/19  8:32 PM  Result Value Ref Range   Troponin I (High Sensitivity) 3 <18 ng/L    Comment: (NOTE) Elevated high sensitivity troponin I (hsTnI) values and significant  changes across serial measurements may suggest ACS but many other  chronic and acute conditions are known to elevate hsTnI results.  Refer to the "Links" section for chest pain algorithms and additional  guidance. Performed at [facility], [address], [place], [place] [address]   SARS Coronavirus 2 (CEPHEID - Performed in [facility] hospital lab), Hosp Order     Status: None   Collection Time: 05/27/19  8:32 PM   Specimen: Nasopharyngeal Swab  Result Value Ref Range   SARS Coronavirus 2 NEGATIVE NEGATIVE    Comment: (NOTE) If result is NEGATIVE SARS-CoV-2 target nucleic acids are NOT DETECTED. The SARS-CoV-2 RNA is generally detectable in upper and lower  respiratory specimens during the acute phase of infection. The lowest  concentration of SARS-CoV-2 viral copies this assay can detect is 250  copies / mL. A negative result does not preclude SARS-CoV-2 infection  and should not be used as the sole basis for treatment or other  patient management decisions.  A negative result may occur with  improper specimen collection / handling, submission of specimen other  than nasopharyngeal swab, presence of viral mutation(s) within the  areas targeted by this assay, and inadequate number of viral copies  (<250 copies / mL). A negative result must be combined with clinical  observations, patient history, and epidemiological information. If result is POSITIVE SARS-CoV-2 target nucleic acids are DETECTED. The SARS-CoV-2 RNA is generally detectable in upper and lower  respiratory specimens dur ing the acute phase of infection.  Positive  results are indicative of active infection with SARS-CoV-2.  Clinical  correlation with patient history and other diagnostic information is  necessary to determine  patient infection status.  Positive results do  not rule out bacterial infection or co-infection with other viruses. If result is PRESUMPTIVE POSTIVE SARS-CoV-2 nucleic acids MAY BE PRESENT.   A presumptive positive result was obtained on the submitted specimen  and confirmed on repeat testing.  While 2019 novel coronavirus  (SARS-CoV-2) nucleic acids may be present in the submitted sample  additional confirmatory testing may be  necessary for epidemiological  and / or clinical management purposes  to differentiate between  SARS-CoV-2 and other Sarbecovirus currently known to infect humans.  If clinically indicated additional testing with an alternate test  methodology [phone]) is advised. The SARS-CoV-2 RNA is generally  detectable in upper and lower respiratory sp ecimens during the acute  phase of infection. The expected result is Negative. Fact Sheet for Patients:  StrictlyIdeas.no Fact Sheet for Healthcare Providers: BankingDealers.co.za This test is not yet approved or cleared by the [place] FDA and has been authorized for detection and/or diagnosis of SARS-CoV-2 by FDA under an Emergency Use Authorization (EUA).  This EUA will remain in effect (meaning this test can be used) for the duration of the COVID-19 declaration under Section 564(b)(1) of the Act, 21 U.S.C. section 360bbb-3(b)(1), unless the authorization is terminated or revoked sooner. Performed at [facility], [address] [place], [place] [address]    Dg Chest 2 View  Result Date: 05/26/2019 CLINICAL DATA:  Fall 2 days ago.  Left anterior chest pain. EXAM: CHEST - 2 VIEW COMPARISON:  Radiographs 03/20/2019 and 03/09/2019.  CT 03/08/2019. FINDINGS: The heart size and mediastinal contours are stable. There is improved aeration of both lung bases with minimal residual atelectasis or scarring in the left lower lobe. There is no pleural effusion or  pneumothorax. No acute osseous findings are seen. IMPRESSION: No evidence of active cardiopulmonary process. Interval improved aeration of the lung bases. Electronically Signed   By: Richardean Sale M.D.   On: 05/26/2019 14:15   Dg Ribs Unilateral Left  Result Date: 05/26/2019 CLINICAL DATA:  Fall 2 days ago. Left anterior chest pain. EXAM: LEFT RIBS - 2 VIEW COMPARISON:  Chest radiographs today and 03/20/2019. FINDINGS: No evidence of acute left-sided rib fracture, pleural effusion or pneumothorax. The aeration of the left lung base has improved with mild residual atelectasis or scarring. There are possible old fractures of the left 7th and 8th ribs anteriorly, best seen on the oblique view. IMPRESSION: No definite acute rib fracture, pleural effusion or pneumothorax. Possible fractures of the left 7th and 8th ribs anteriorly. Electronically Signed   By: Richardean Sale M.D.   On: 05/26/2019 14:17   Dg Chest Portable 1 View  Result Date: 05/27/2019 CLINICAL DATA:  Chest pain, hypoxia EXAM: PORTABLE CHEST 1 VIEW COMPARISON:  05/26/2019, 03/20/2019, 03/09/2019 FINDINGS: No acute airspace disease or pleural effusion. Minimal atelectasis or scar at the left base. Stable cardiomediastinal silhouette. No pneumothorax. IMPRESSION: No active disease.  Minimal atelectasis or scar at the left base Electronically Signed   By: Donavan Foil M.D.   On: 05/27/2019 20:49    Pending Labs Unresulted Labs (From admission, onward)    Start     Ordered   Signed and Held  CBC  (heparin)  Once,   R    Comments: Baseline for heparin therapy IF NOT ALREADY DRAWN.  Notify MD if PLT < 100 K.    Signed and Held   Signed and Held  Creatinine, serum  (heparin)  Once,   R    Comments: Baseline for heparin therapy IF NOT ALREADY DRAWN.    Signed and Held   Signed and Held  Basic metabolic panel  Tomorrow morning,   R     Signed and Held   Signed and Held  CBC  Tomorrow morning,   R     Signed and Held           Vitals/Pain Today's  Vitals   05/27/19 2130 05/27/19 2200 05/27/19 2256 05/27/19 2304  BP: 102/65 (!) 106/59 110/73   Pulse:   84 98  Resp:      SpO2:   96% 93%  Weight:      Height:        Isolation Precautions No active isolations  Medications Medications  LORazepam (ATIVAN) injection 0-4 mg (has no administration in time range)    Or  LORazepam (ATIVAN) tablet 0-4 mg (has no administration in time range)  LORazepam (ATIVAN) injection 0-4 mg (has no administration in time range)    Or  LORazepam (ATIVAN) tablet 0-4 mg (has no administration in time range)  thiamine (VITAMIN B-1) tablet 100 mg (has no administration in time range)    Or  thiamine (B-1) injection 100 mg (has no administration in time range)  sodium chloride 0.9 % bolus 1,000 mL (1,000 mLs Intravenous New Bag/Given 05/27/19 2325)    Mobility walks with person assist Moderate fall risk   Focused Assessments Pulmonary Assessment Handoff:  Lung sounds:            R Recommendations: See Admitting Provider Note  Report given to:   Additional Notes:

## 2019-05-28 NOTE — Discharge Instructions (Signed)
Do not lift anything heavy or exert till you see your doctor.  Resume diet as before

## 2019-05-28 NOTE — Progress Notes (Signed)
[facility] [place] NAME: Miguel [name]    MR#:  [id number]  DATE OF BIRTH:  11/15/1961  SUBJECTIVE:  CHIEF COMPLAINT:   Chief Complaint  Patient presents with  . Chest Pain    from fall - negative rib fx   Left chest pain, pleuritic.  REVIEW OF SYSTEMS:    Review of Systems  Constitutional: Negative for chills and fever.  HENT: Negative for sore throat.   Eyes: Negative for blurred vision, double vision and pain.  Respiratory: Negative for cough, hemoptysis, shortness of breath and wheezing.   Cardiovascular: Positive for chest pain. Negative for palpitations, orthopnea and leg swelling.  Gastrointestinal: Negative for abdominal pain, constipation, diarrhea, heartburn, nausea and vomiting.  Genitourinary: Negative for dysuria and hematuria.  Musculoskeletal: Negative for back pain and joint pain.  Skin: Negative for rash.  Neurological: Negative for sensory change, speech change, focal weakness and headaches.  Endo/Heme/Allergies: Does not bruise/bleed easily.  Psychiatric/Behavioral: Negative for depression. The patient is not nervous/anxious.     DRUG ALLERGIES:   Allergies  Allergen Reactions  . Percocet [Oxycodone-Acetaminophen] Nausea And Vomiting    VITALS:  Blood pressure (!) 143/84, pulse 81, temperature 97.6 F (36.4 C), temperature source Oral, resp. rate 16, height 6' (1.829 m), weight 83.9 kg, SpO2 95 %.  PHYSICAL EXAMINATION:   Physical Exam  GENERAL:  58 y.o.-year-old patient lying in the bed with no acute distress.  EYES: Pupils equal, round, reactive to light and accommodation. No scleral icterus. Extraocular muscles intact.  HEENT: Head atraumatic, normocephalic. Oropharynx and nasopharynx clear.  NECK:  Supple, no jugular venous distention. No thyroid enlargement, no tenderness.  LUNGS: Normal breath sounds bilaterally, no wheezing, rales, rhonchi. No use of accessory muscles of respiration.  Tender left  chest wall CARDIOVASCULAR: S1, S2 normal. No murmurs, rubs, or gallops.  ABDOMEN: Soft, nontender, nondistended. Bowel sounds present. No organomegaly or mass.  EXTREMITIES: No cyanosis, clubbing or edema b/l.    NEUROLOGIC: Cranial nerves II through XII are intact. No focal Motor or sensory deficits b/l.   PSYCHIATRIC: The patient is alert and oriented x 3.  SKIN: No obvious rash, lesion, or ulcer.   LABORATORY PANEL:   CBC Recent Labs  Lab 05/28/19 0529  WBC 5.8  HGB 11.9*  HCT 38.7*  PLT 199   ------------------------------------------------------------------------------------------------------------------ Chemistries  Recent Labs  Lab 05/27/19 2032 05/28/19 0529  NA 138 147*  K 2.8* 3.5  CL 105 113*  CO2 20* 27  GLUCOSE 152* 120*  BUN 20 16  CREATININE 2.79* 1.77*  CALCIUM 8.5* 8.8*  AST 12*  --   ALT 12  --   ALKPHOS 58  --   BILITOT 0.6  --    ------------------------------------------------------------------------------------------------------------------  Cardiac Enzymes No results for input(s): TROPONINI in the last 168 hours. ------------------------------------------------------------------------------------------------------------------  RADIOLOGY:  Dg Chest 2 View  Result Date: 05/26/2019 CLINICAL DATA:  Fall 2 days ago.  Left anterior chest pain. EXAM: CHEST - 2 VIEW COMPARISON:  Radiographs 03/20/2019 and 03/09/2019.  CT 03/08/2019. FINDINGS: The heart size and mediastinal contours are stable. There is improved aeration of both lung bases with minimal residual atelectasis or scarring in the left lower lobe. There is no pleural effusion or pneumothorax. No acute osseous findings are seen. IMPRESSION: No evidence of active cardiopulmonary process. Interval improved aeration of the lung bases. Electronically Signed   By: Richardean Sale M.D.   On: 05/26/2019 14:15   Dg Ribs Unilateral Left  Result Date: 05/26/2019 CLINICAL DATA:  Fall 2 days ago. Left  anterior chest pain. EXAM: LEFT RIBS - 2 VIEW COMPARISON:  Chest radiographs today and 03/20/2019. FINDINGS: No evidence of acute left-sided rib fracture, pleural effusion or pneumothorax. The aeration of the left lung base has improved with mild residual atelectasis or scarring. There are possible old fractures of the left 7th and 8th ribs anteriorly, best seen on the oblique view. IMPRESSION: No definite acute rib fracture, pleural effusion or pneumothorax. Possible fractures of the left 7th and 8th ribs anteriorly. Electronically Signed   By: Richardean Sale M.D.   On: 05/26/2019 14:17   Dg Chest Portable 1 View  Result Date: 05/27/2019 CLINICAL DATA:  Chest pain, hypoxia EXAM: PORTABLE CHEST 1 VIEW COMPARISON:  05/26/2019, 03/20/2019, 03/09/2019 FINDINGS: No acute airspace disease or pleural effusion. Minimal atelectasis or scar at the left base. Stable cardiomediastinal silhouette. No pneumothorax. IMPRESSION: No active disease.  Minimal atelectasis or scar at the left base Electronically Signed   By: Donavan Foil M.D.   On: 05/27/2019 20:49     ASSESSMENT AND PLAN:   *Acute kidney injury due to dehydration.  On IV fluids and slowly improving.  Continue to monitor input and output.  Repeat labs in the morning   *Left 7 and 8 rib fractures.  Incentive spirometer added.  Pain medications added.   *Alcohol abuse.  On CIWA protocol.  Scheduled Librium added.   * Hypertension -home dose antihypertensives   * Asthma -home dose inhalers   * GERD (gastroesophageal reflux disease) -home dose PPI  All the records are reviewed and case discussed with Care Management/Social Worker Management plans discussed with the patient, family and they are in agreement.  CODE STATUS: FULL CODE  DVT Prophylaxis: SCDs  TOTAL TIME TAKING CARE OF THIS PATIENT: 35 minutes.   POSSIBLE D/C IN 1-2 DAYS, DEPENDING ON CLINICAL CONDITION.  Neita Carp M.D on 05/28/2019 at 11:48 AM  Between 7am to 6pm -  Pager - [phone]  After 6pm go to [email or web address] - password [email or web address] [facility]  Office  [phone]  CC: Primary care physician; System, Pcp Not In  Note: This dictation was prepared with Dragon dictation along with smaller phrase technology. Any transcriptional errors that result from this process are unintentional.

## 2019-05-28 NOTE — ED Triage Notes (Signed)
Patient states that he was seen here and was discharged 2 hours ago wait for a taxi. Patient states that while he was waiting he developed left knee pain and that he thinks that it is gout.

## 2019-05-28 NOTE — Progress Notes (Signed)
Advance care planning  Purpose of Encounter Rib fractures, alcohol abuse  Parties in Attendance Patient  Patients Decisional capacity No documented healthcare power of attorney.  Wife will make decisions if patient is unable to.  Discussed in detail regarding Rib fractures, alcohol abuse.  Treatment plan , prognosis discussed.  All questions answered  CODE STATUS discussed.  Patient wishes for aggressive medical care with defibrillation/CPR/intubation if needed with ventilatory support.  Full code  Time spent - 17 minutes

## 2019-05-30 ENCOUNTER — Other Ambulatory Visit: Payer: Self-pay

## 2019-05-30 ENCOUNTER — Telehealth: Payer: Self-pay | Admitting: Pharmacy Technician

## 2019-05-30 ENCOUNTER — Ambulatory Visit: Payer: No Typology Code available for payment source | Admitting: Licensed Clinical Social Worker

## 2019-05-30 DIAGNOSIS — F19239 Other psychoactive substance dependence with withdrawal, unspecified: Secondary | ICD-10-CM

## 2019-05-30 DIAGNOSIS — F19280 Other psychoactive substance dependence with psychoactive substance-induced anxiety disorder: Secondary | ICD-10-CM

## 2019-05-30 DIAGNOSIS — F1020 Alcohol dependence, uncomplicated: Secondary | ICD-10-CM

## 2019-05-30 DIAGNOSIS — F4321 Adjustment disorder with depressed mood: Secondary | ICD-10-CM

## 2019-05-30 NOTE — Discharge Summary (Signed)
Miguel [name] at [place] NAME: Miguel [name]    MR#:  [id number]  DATE OF BIRTH:  April 23, 1961  DATE OF ADMISSION:  05/27/2019 ADMITTING PHYSICIAN: Lance Coon, MD  DATE OF DISCHARGE: 05/28/2019  4:48 PM  PRIMARY CARE PHYSICIAN: System, Pcp Not In   ADMISSION DIAGNOSIS:  Renal insufficiency [N28.9] Hypotension, unspecified hypotension type [I95.9]  DISCHARGE DIAGNOSIS:  Principal Problem:   AKI (acute kidney injury) ([place]) Active Problems:   Hypertension   Asthma   GERD (gastroesophageal reflux disease)   Alcoholic intoxication with complication ([place])   SECONDARY DIAGNOSIS:   Past Medical History:  Diagnosis Date  . Alcohol abuse   . Asthma   . GERD (gastroesophageal reflux disease)   . Gout   . Hypertension   . Kidney stone   . OCD (obsessive compulsive disorder)   . Renal colic      ADMITTING HISTORY  HISTORY OF PRESENT ILLNESS:  Miguel [name]  is a 58 y.o. male who presents with chief complaint as above.  Patient presents the ED with a complaint of chest pain.  He states that he fell a couple of days ago and his left lower thoracic chest has been hurting since that time.  It is point tenderness, and worse on palpation.  Cardiac work-up in the ED is within normal limits.  However, lab evaluation showed acute kidney injury, and elevated alcohol level.  Patient is unable to give a clear idea about how much he has been drinking.  He was somewhat hypotensive when he first arrived as well.  Hospitalist called for admission and further treatment  HOSPITAL COURSE:   *Acute kidney injury due to dehydration.  Started on IV fluids and this improved well.  Was 2.7 on admission and 1.7 on day of discharge  *Left 7 and 8 rib fractures.  Incentive spirometer added.  Pain medications added. Discharged home on oxycodone  *Alcohol abuse.  On CIWA protocol.  Scheduled Librium added. No withdrawals in the hospital  *Hypertension -home dose  antihypertensives  *Asthma -home dose inhalers  *GERD (gastroesophageal reflux disease) -home dose PPI  Patient requested to be discharged home as he is the sole caregiver for the wife.  He still has pain but feels that oxycodone has helped and is requesting a prescription for it.  He is able to ambulate.  Oxygen saturations are normal.  Afebrile.  CONSULTS OBTAINED:    DRUG ALLERGIES:  No Known Allergies  DISCHARGE MEDICATIONS:   Allergies as of 05/28/2019   No Known Allergies     Medication List    STOP taking these medications   chlordiazePOXIDE 25 MG capsule Commonly known as: LIBRIUM     TAKE these medications   albuterol 108 (90 Base) MCG/ACT inhaler Commonly known as: Ventolin HFA Inhale 2 puffs into the lungs every 4 (four) hours as needed. What changed: reasons to take this   allopurinol 300 MG tablet Commonly known as: ZYLOPRIM Take 1 tablet (300 mg total) by mouth daily.   colchicine 0.6 MG tablet Take 1 tablet (0.6 mg total) by mouth daily. What changed:   when to take this  reasons to take this   docusate sodium 100 MG capsule Commonly known as: Colace Take 1 tablet once or twice daily as needed for constipation while taking narcotic pain medicine   Fluticasone-Salmeterol 100-50 MCG/DOSE Aepb Commonly known as: Advair Diskus Inhale 1 puff into the lungs 2 (two) times daily.   folic acid 1 MG  tablet Commonly known as: FOLVITE Take 1 tablet (1 mg total) by mouth daily.   gabapentin 100 MG capsule Commonly known as: NEURONTIN Take 2 capsules (200 mg total) by mouth 3 (three) times daily.   lisinopril 20 MG tablet Commonly known as: ZESTRIL Take 1 tablet (20 mg total) by mouth daily.   multivitamin with minerals tablet Take 1 tablet by mouth daily.   naltrexone 50 MG tablet Commonly known as: DEPADE Take 1 tablet (50 mg total) by mouth daily for 30 days.   oxyCODONE 5 MG immediate release tablet Commonly known as: Oxy  IR/ROXICODONE Take 1 tablet (5 mg total) by mouth every 6 (six) hours as needed for severe pain.   promethazine 12.5 MG tablet Commonly known as: PHENERGAN Take 1 tablet (12.5 mg total) by mouth every 6 (six) hours as needed.   tiZANidine 4 MG tablet Commonly known as: ZANAFLEX Take 0.5 tablets (2 mg total) by mouth every 8 (eight) hours as needed for muscle spasms.       Today   VITAL SIGNS:  Blood pressure (!) 143/84, pulse 81, temperature 97.6 F (36.4 C), temperature source Oral, resp. rate 16, height 6' (1.829 m), weight 83.9 kg, SpO2 95 %.  I/O:  No intake or output data in the 24 hours ending 05/30/19 1652  PHYSICAL EXAMINATION:  Physical Exam  GENERAL:  58 y.o.-year-old patient lying in the bed with no acute distress.  LUNGS: Normal breath sounds bilaterally, no wheezing, rales,rhonchi or crepitation. No use of accessory muscles of respiration.  CARDIOVASCULAR: S1, S2 normal. No murmurs, rubs, or gallops.  ABDOMEN: Soft, non-tender, non-distended. Bowel sounds present. No organomegaly or mass.  NEUROLOGIC: Moves all 4 extremities. PSYCHIATRIC: The patient is alert and oriented x 3.  SKIN: No obvious rash, lesion, or ulcer.   DATA REVIEW:   CBC Recent Labs  Lab 05/28/19 0529  WBC 5.8  HGB 11.9*  HCT 38.7*  PLT 199    Chemistries  Recent Labs  Lab 05/27/19 2032 05/28/19 0529  NA 138 147*  K 2.8* 3.5  CL 105 113*  CO2 20* 27  GLUCOSE 152* 120*  BUN 20 16  CREATININE 2.79* 1.77*  CALCIUM 8.5* 8.8*  AST 12*  --   ALT 12  --   ALKPHOS 58  --   BILITOT 0.6  --     Cardiac Enzymes No results for input(s): TROPONINI in the last 168 hours.  Microbiology Results  Results for orders placed or performed during the hospital encounter of 05/27/19  SARS Coronavirus 2 (CEPHEID - Performed in [facility] hospital lab), Hosp Order     Status: None   Collection Time: 05/27/19  8:32 PM   Specimen: Nasopharyngeal Swab  Result Value Ref Range Status   SARS  Coronavirus 2 NEGATIVE NEGATIVE Final    Comment: (NOTE) If result is NEGATIVE SARS-CoV-2 target nucleic acids are NOT DETECTED. The SARS-CoV-2 RNA is generally detectable in upper and lower  respiratory specimens during the acute phase of infection. The lowest  concentration of SARS-CoV-2 viral copies this assay can detect is 250  copies / mL. A negative result does not preclude SARS-CoV-2 infection  and should not be used as the sole basis for treatment or other  patient management decisions.  A negative result may occur with  improper specimen collection / handling, submission of specimen other  than nasopharyngeal swab, presence of viral mutation(s) within the  areas targeted by this assay, and inadequate number of viral copies  (<  250 copies / mL). A negative result must be combined with clinical  observations, patient history, and epidemiological information. If result is POSITIVE SARS-CoV-2 target nucleic acids are DETECTED. The SARS-CoV-2 RNA is generally detectable in upper and lower  respiratory specimens dur ing the acute phase of infection.  Positive  results are indicative of active infection with SARS-CoV-2.  Clinical  correlation with patient history and other diagnostic information is  necessary to determine patient infection status.  Positive results do  not rule out bacterial infection or co-infection with other viruses. If result is PRESUMPTIVE POSTIVE SARS-CoV-2 nucleic acids MAY BE PRESENT.   A presumptive positive result was obtained on the submitted specimen  and confirmed on repeat testing.  While 2019 novel coronavirus  (SARS-CoV-2) nucleic acids may be present in the submitted sample  additional confirmatory testing may be necessary for epidemiological  and / or clinical management purposes  to differentiate between  SARS-CoV-2 and other Sarbecovirus currently known to infect humans.  If clinically indicated additional testing with an alternate test   methodology [phone]) is advised. The SARS-CoV-2 RNA is generally  detectable in upper and lower respiratory sp ecimens during the acute  phase of infection. The expected result is Negative. Fact Sheet for Patients:  StrictlyIdeas.no Fact Sheet for Healthcare Providers: BankingDealers.co.za This test is not yet approved or cleared by the [place] FDA and has been authorized for detection and/or diagnosis of SARS-CoV-2 by FDA under an Emergency Use Authorization (EUA).  This EUA will remain in effect (meaning this test can be used) for the duration of the COVID-19 declaration under Section 564(b)(1) of the Act, 21 U.S.C. section 360bbb-3(b)(1), unless the authorization is terminated or revoked sooner. Performed at [facility], [address], [place], [place] [address]     RADIOLOGY:  No results found.  Follow up with PCP in 1 week.  Management plans discussed with the patient, family and they are in agreement.  CODE STATUS:  Code Status History    Date Active Date Inactive Code Status Order ID Comments User Context   05/28/2019 0039 05/28/2019 1907 Full Code [id number]  Lance Coon, MD Inpatient   05/07/2019 2328 05/08/2019 1641 Full Code [id number]  Mayer Camel, NP ED   03/08/2019 0753 03/09/2019 2021 Full Code [id number]  Lang Snow, NP ED   09/14/2018 0303 09/15/2018 1923 Full Code [id number]  Arta Silence, MD Inpatient   12/25/2017 1405 12/29/2017 1856 Full Code [id number]  Gladstone Lighter, MD Inpatient   03/09/2017 2107 03/12/2017 1506 Full Code [id number]  Gonzella Lex, MD Inpatient   08/15/2016 0009 08/15/2016 1528 Full Code [id number]  Nena Polio, MD ED   Advance Care Planning Activity      TOTAL TIME TAKING CARE OF THIS PATIENT ON DAY OF DISCHARGE: more than 30 minutes.   Neita Carp M.D on 05/30/2019 at 4:52 PM  Between 7am to 6pm - Pager - [phone]  After 6pm go to  [email or web address] - password [email or web address] [facility]  Office  [phone]  CC: Primary care physician; System, Pcp Not In  Note: This dictation was prepared with Dragon dictation along with smaller phrase technology. Any transcriptional errors that result from this process are unintentional.

## 2019-05-30 NOTE — Telephone Encounter (Signed)
Received 2020 proof of income.  Patient eligible to receive medication assistance at [facility] as long as eligibility requirements continue to be met.  [facility]
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
## 2019-06-01 ENCOUNTER — Ambulatory Visit: Payer: Medicaid Other | Admitting: Gerontology

## 2019-06-03 ENCOUNTER — Emergency Department
Admission: EM | Admit: 2019-06-03 | Discharge: 2019-06-03 | Disposition: A | Discharge status: Home / Self Care | Payer: Medicaid Other | Attending: Emergency Medicine | Admitting: Emergency Medicine

## 2019-06-03 ENCOUNTER — Other Ambulatory Visit: Payer: Self-pay

## 2019-06-03 ENCOUNTER — Emergency Department: Payer: Medicaid Other

## 2019-06-03 ENCOUNTER — Encounter: Payer: Self-pay | Admitting: Emergency Medicine

## 2019-06-03 DIAGNOSIS — I10 Essential (primary) hypertension: Secondary | ICD-10-CM | POA: Insufficient documentation

## 2019-06-03 DIAGNOSIS — Y939 Activity, unspecified: Secondary | ICD-10-CM | POA: Insufficient documentation

## 2019-06-03 DIAGNOSIS — Y999 Unspecified external cause status: Secondary | ICD-10-CM | POA: Insufficient documentation

## 2019-06-03 DIAGNOSIS — Z87891 Personal history of nicotine dependence: Secondary | ICD-10-CM | POA: Insufficient documentation

## 2019-06-03 DIAGNOSIS — Z79899 Other long term (current) drug therapy: Secondary | ICD-10-CM | POA: Insufficient documentation

## 2019-06-03 DIAGNOSIS — X58XXXA Exposure to other specified factors, initial encounter: Secondary | ICD-10-CM | POA: Insufficient documentation

## 2019-06-03 DIAGNOSIS — Y929 Unspecified place or not applicable: Secondary | ICD-10-CM | POA: Insufficient documentation

## 2019-06-03 DIAGNOSIS — S301XXA Contusion of abdominal wall, initial encounter: Secondary | ICD-10-CM | POA: Insufficient documentation

## 2019-06-03 DIAGNOSIS — J45909 Unspecified asthma, uncomplicated: Secondary | ICD-10-CM | POA: Insufficient documentation

## 2019-06-03 LAB — CBC WITH DIFFERENTIAL/PLATELET
Abs Immature Granulocytes: 0.02 10*3/uL (ref 0.00–0.07)
Basophils Absolute: 0 10*3/uL (ref 0.0–0.1)
Basophils Relative: 1 %
Eosinophils Absolute: 0.3 10*3/uL (ref 0.0–0.5)
Eosinophils Relative: 6 %
HCT: 41.3 % (ref 39.0–52.0)
Hemoglobin: 12.7 g/dL — ABNORMAL LOW (ref 13.0–17.0)
Immature Granulocytes: 0 %
Lymphocytes Relative: 36 %
Lymphs Abs: 1.7 10*3/uL (ref 0.7–4.0)
MCH: 26.5 pg (ref 26.0–34.0)
MCHC: 30.8 g/dL (ref 30.0–36.0)
MCV: 86 fL (ref 80.0–100.0)
Monocytes Absolute: 0.4 10*3/uL (ref 0.1–1.0)
Monocytes Relative: 8 %
Neutro Abs: 2.3 10*3/uL (ref 1.7–7.7)
Neutrophils Relative %: 49 %
Platelets: 256 10*3/uL (ref 150–400)
RBC: 4.8 MIL/uL (ref 4.22–5.81)
RDW: 18.4 % — ABNORMAL HIGH (ref 11.5–15.5)
WBC: 4.8 10*3/uL (ref 4.0–10.5)
nRBC: 0 % (ref 0.0–0.2)

## 2019-06-03 LAB — COMPREHENSIVE METABOLIC PANEL
ALT: 10 U/L (ref 0–44)
AST: 13 U/L — ABNORMAL LOW (ref 15–41)
Albumin: 3.7 g/dL (ref 3.5–5.0)
Alkaline Phosphatase: 63 U/L (ref 38–126)
Anion gap: 9 (ref 5–15)
BUN: 13 mg/dL (ref 6–20)
CO2: 27 mmol/L (ref 22–32)
Calcium: 8.9 mg/dL (ref 8.9–10.3)
Chloride: 110 mmol/L (ref 98–111)
Creatinine, Ser: 1 mg/dL (ref 0.61–1.24)
GFR calc Af Amer: >60 mL/min (ref >60)
GFR calc non Af Amer: >60 mL/min (ref >60)
Glucose, Bld: 111 mg/dL — ABNORMAL HIGH (ref 70–99)
Potassium: 4 mmol/L (ref 3.5–5.1)
Sodium: 146 mmol/L — ABNORMAL HIGH (ref 135–145)
Total Bilirubin: 0.5 mg/dL (ref 0.3–1.2)
Total Protein: 6.9 g/dL (ref 6.5–8.1)

## 2019-06-03 LAB — LIPASE, BLOOD: Lipase: 39 U/L (ref 11–51)

## 2019-06-03 MED ORDER — ONDANSETRON HCL 4 MG PO TABS: 4.0000 mg | ORAL_TABLET | Freq: Three times a day (TID) | ORAL | 0 refills | Status: AC | PRN

## 2019-06-03 MED ORDER — TRAMADOL HCL 50 MG PO TABS: 50.0000 mg | ORAL_TABLET | Freq: Four times a day (QID) | ORAL | 0 refills | Status: AC | PRN

## 2019-06-03 MED ORDER — MORPHINE SULFATE (PF) 4 MG/ML IV SOLN
4.0000 mg | Freq: Once | INTRAVENOUS | Status: AC
  Administered 2019-06-03: 4 mg via INTRAVENOUS
  Filled 2019-06-03: qty 1

## 2019-06-03 MED ORDER — IOHEXOL 300 MG/ML  SOLN
100.0000 mL | Freq: Once | INTRAMUSCULAR | Status: AC | PRN
  Administered 2019-06-03: 100 mL via INTRAVENOUS
  Filled 2019-06-03: qty 100

## 2019-06-03 MED ORDER — TRAMADOL HCL 50 MG PO TABS
50.0000 mg | ORAL_TABLET | Freq: Once | ORAL | Status: AC
  Administered 2019-06-03: 50 mg via ORAL
  Filled 2019-06-03: qty 1

## 2019-06-03 NOTE — ED Triage Notes (Signed)
Pain with inspiration. States fell and was seen here 3 days ago and has known fractured rib.

## 2019-06-04 NOTE — ED Provider Notes (Signed)
[facility] Emergency Department Provider Note  ____________________________________________  Time seen: Approximately 12:40 AM  I have reviewed the triage vital signs and the nursing notes.   HISTORY  Chief Complaint Rib Injury    HPI Miguel [name] is a 58 y.o. male with a history of alcohol abuse and hypertension presents to the emergency department with reproducible anterior chest wall pain to palpation.  Patient states that he experienced a fall 3 days ago.  Patient states that he has also noticed abdominal pain and bruising around the abdomen.  He denies chest tightness, shortness of breath, nausea or vomiting.  Patient states that he has been ambulating easily.  No other alleviating measures have been attempted.        Past Medical History:  Diagnosis Date  . Alcohol abuse   . Asthma   . GERD (gastroesophageal reflux disease)   . Gout   . Hypertension   . Kidney stone   . OCD (obsessive compulsive disorder)   . Renal colic     Patient Active Problem List   Diagnosis Date Noted  . AKI (acute kidney injury) ([place]) 05/27/2019  . Alcohol withdrawal ([place]) 05/07/2019  . Alcoholic intoxication with complication ([place])   . Sepsis ([place]) 03/08/2019  . Breast lump or mass 10/04/2018  . Sepsis secondary to UTI ([place]) 09/14/2018  . Asthma 07/07/2018  . GERD (gastroesophageal reflux disease) 07/07/2018  . OCD (obsessive compulsive disorder) 07/07/2018  . Self-inflicted laceration of wrist 03/10/2018  . Leg hematoma 12/25/2017  . Suicide and self-inflicted injury by cutting and piercing instrument ([place]) 03/10/2017  . Severe recurrent major 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 ([place]) 03/09/2017  . Substance induced mood disorder ([place]) 08/15/2016  . Involuntary commitment 08/15/2016  . Alcohol use disorder, severe, dependence ([place]) 02/05/2016  . Hypertension 12/05/2015  . Tachycardia 12/05/2015  . Gout 11/13/2015  . Chronic back pain 05/02/2015    Past  Surgical History:  Procedure Laterality Date  . CHOLECYSTECTOMY  2012  . EXTRACORPOREAL SHOCK WAVE LITHOTRIPSY Left 01/12/2019   Procedure: EXTRACORPOREAL SHOCK WAVE LITHOTRIPSY (ESWL);  Surgeon: Billey Co, MD;  Location: [facility] ORS;  Service: Urology;  Laterality: Left;    Prior to Admission medications   Medication Sig Start Date End Date Taking? Authorizing Provider  albuterol (VENTOLIN HFA) 108 (90 Base) MCG/ACT inhaler Inhale 2 puffs into the lungs every 4 (four) hours as needed. Patient taking differently: Inhale 2 puffs into the lungs every 4 (four) hours as needed for wheezing or shortness of breath.  09/01/18   Tukov-Yual, Arlyss Gandy, NP  allopurinol (ZYLOPRIM) 300 MG tablet Take 1 tablet (300 mg total) by mouth daily. 09/01/18   Tukov-Yual, Arlyss Gandy, NP  colchicine 0.6 MG tablet Take 1 tablet (0.6 mg total) by mouth daily. Patient taking differently: Take 0.6 mg by mouth daily as needed.  03/09/19   Lang Snow, NP  docusate sodium (COLACE) 100 MG capsule Take 1 tablet once or twice daily as needed for constipation while taking narcotic pain medicine 01/09/19   Hinda Kehr, MD  Fluticasone-Salmeterol (ADVAIR DISKUS) 100-50 MCG/DOSE AEPB Inhale 1 puff into the lungs 2 (two) times daily. 09/01/18   Tukov-Yual, Arlyss Gandy, NP  folic acid (FOLVITE) 1 MG tablet Take 1 tablet (1 mg total) by mouth daily. 09/01/18   Tukov-Yual, Arlyss Gandy, NP  gabapentin (NEURONTIN) 100 MG capsule Take 2 capsules (200 mg total) by mouth 3 (three) times daily. 04/21/19   Patrecia Pour, NP  lisinopril (PRINIVIL,ZESTRIL)  20 MG tablet Take 1 tablet (20 mg total) by mouth daily. 02/21/19   Iloabachie, Chioma E, NP  Multiple Vitamins-Minerals (MULTIVITAMIN WITH MINERALS) tablet Take 1 tablet by mouth daily. 09/01/18   Tukov-Yual, Arlyss Gandy, NP  naltrexone (DEPADE) 50 MG tablet Take 1 tablet (50 mg total) by mouth daily for 30 days. 05/08/19 06/07/19  Mayo, Pete Pelt, MD  ondansetron (ZOFRAN) 4  MG tablet Take 1 tablet (4 mg total) by mouth every 8 (eight) hours as needed for up to 5 days for nausea or vomiting. 06/03/19 06/08/19  Lannie Fields, PA-C  oxyCODONE (OXY IR/ROXICODONE) 5 MG immediate release tablet Take 1 tablet (5 mg total) by mouth every 6 (six) hours as needed for severe pain. 05/28/19   Hillary Bow, MD  promethazine (PHENERGAN) 12.5 MG tablet Take 1 tablet (12.5 mg total) by mouth every 6 (six) hours as needed. 05/17/19   Merlyn Lot, MD  tiZANidine (ZANAFLEX) 4 MG tablet Take 0.5 tablets (2 mg total) by mouth every 8 (eight) hours as needed for muscle spasms. 04/26/19   Iloabachie, Chioma E, NP  traMADol (ULTRAM) 50 MG tablet Take 1 tablet (50 mg total) by mouth every 6 (six) hours as needed for up to 3 days. 06/03/19 06/06/19  Lannie Fields, PA-C    Allergies Patient has no known allergies.  Family History  Problem Relation Age of Onset  . Alcohol abuse Father   . Breast cancer Mother 23    Social History Social History   Tobacco Use  . Smoking status: Former Research scientist (life sciences)  . Smokeless tobacco: Never Used  . Tobacco comment: quit 30 years ago  Substance Use Topics  . Alcohol use: Yes    Alcohol/week: 2.0 standard drinks    Types: 2 Glasses of wine per week    Comment: daily  . Drug use: No     Review of Systems  Constitutional: No fever/chills Eyes: No visual changes. No discharge ENT: No upper respiratory complaints. Cardiovascular: Patient has anterior chest wall pain. Respiratory: no cough. No SOB. Gastrointestinal: Patient has abdominal pain.  No nausea, no vomiting.  No diarrhea.  No constipation. Musculoskeletal: Negative for musculoskeletal pain. Skin: Negative for rash, abrasions, lacerations, ecchymosis. Neurological: Negative for headaches, focal weakness or numbness.   ____________________________________________   PHYSICAL EXAM:  VITAL SIGNS: ED Triage Vitals  Enc Vitals Group     BP 06/03/19 1610 129/79     Pulse Rate 06/03/19  1610 89     Resp 06/03/19 1610 20     Temp 06/03/19 1610 99 F (37.2 C)     Temp Source 06/03/19 1610 Oral     SpO2 06/03/19 1610 95 %     Weight 06/03/19 1615 185 lb (83.9 kg)     Height 06/03/19 1615 6' (1.829 m)     Head Circumference --      Peak Flow --      Pain Score 06/03/19 1615 8     Pain Loc --      Pain Edu? --      Excl. in [place]? --      Constitutional: Alert and oriented. Well appearing and in no acute distress. Eyes: Conjunctivae are normal. PERRL. EOMI. Head: Atraumatic. ENT:      Nose: No congestion/rhinnorhea.      Mouth/Throat: Mucous membranes are moist.  Neck: No stridor.  Full range of motion.  No midline C-spine tenderness.  Cardiovascular: Normal rate, regular rhythm. Normal S1 and S2.  Good peripheral  circulation.  Patient has reproducible anterior chest wall pain to palpation. Respiratory: Normal respiratory effort without tachypnea or retractions. Lungs CTAB. Good air entry to the bases with no decreased or absent breath sounds. Gastrointestinal: Bowel sounds 4 quadrants.  Patient has guarding and pain to palpation in the left upper quadrant.  He has ecchymosis along left lower quadrant.  No palpable masses. No distention. No CVA tenderness. Musculoskeletal: Full range of motion to all extremities. No gross deformities appreciated. Neurologic:  Normal speech and language. No gross focal neurologic deficits are appreciated.  Skin:  Skin is warm, dry and intact. No rash noted. Psychiatric: Mood and affect are normal. Speech and behavior are normal. Patient exhibits appropriate insight and judgement.   ____________________________________________   LABS (all labs ordered are listed, but only abnormal results are displayed)  Labs Reviewed  CBC WITH DIFFERENTIAL/PLATELET - Abnormal; Notable for the following components:      Result Value   Hemoglobin 12.7 (*)    RDW 18.4 (*)    All other components within normal limits  COMPREHENSIVE METABOLIC PANEL -  Abnormal; Notable for the following components:   Sodium 146 (*)    Glucose, Bld 111 (*)    AST 13 (*)    All other components within normal limits  LIPASE, BLOOD   ____________________________________________  EKG   ____________________________________________  RADIOLOGY I personally viewed and evaluated these images as part of my medical decision making, as well as reviewing the written report by the radiologist.  Dg Chest 2 View  Result Date: 06/03/2019 CLINICAL DATA:  Chest pain after fall. EXAM: CHEST - 2 VIEW COMPARISON:  Radiograph May 27, 2019. FINDINGS: The heart size and mediastinal contours are within normal limits. Both lungs are clear. No pneumothorax or pleural effusion is noted. The visualized skeletal structures are unremarkable. IMPRESSION: No active cardiopulmonary disease. Electronically Signed   By: Marijo Conception M.D.   On: 06/03/2019 17:12   Ct Abdomen Pelvis W Contrast  Result Date: 06/03/2019 CLINICAL DATA:  Acute pain due to trauma.  Fractured ribs. EXAM: CT ABDOMEN AND PELVIS WITH CONTRAST TECHNIQUE: Multidetector CT imaging of the abdomen and pelvis was performed using the standard protocol following bolus administration of intravenous contrast. CONTRAST:  119mL OMNIPAQUE IOHEXOL 300 MG/ML  SOLN COMPARISON:  May 09, 2019 CT of the pelvis. FINDINGS: Lower chest: The lung bases are clear. The heart size is normal. Hepatobiliary: The liver is normal. Status post cholecystectomy.There is no biliary ductal dilation. Pancreas: Normal contours without ductal dilatation. No peripancreatic fluid collection. Spleen: No splenic laceration or hematoma. Adrenals/Urinary Tract: --Adrenal glands: No adrenal hemorrhage. --Right kidney/ureter: There is a nonobstructing stone in lower pole the right kidney. There is no right-sided hydronephrosis. --Left kidney/ureter: There are multiple nonobstructing stones in the left kidney. There is no left-sided hydronephrosis. --Urinary  bladder: Unremarkable. Stomach/Bowel: --Stomach/Duodenum: No hiatal hernia or other gastric abnormality. Normal duodenal course and caliber. --Small bowel: No dilatation or inflammation. --Colon: No focal abnormality. --Appendix: Normal. Vascular/Lymphatic: Normal course and caliber of the major abdominal vessels. --No retroperitoneal lymphadenopathy. --No mesenteric lymphadenopathy. --No pelvic or inguinal lymphadenopathy. Reproductive: Unremarkable Other: No ascites or free air. There is a small fat containing periumbilical hernia. Musculoskeletal. There is a chronic appearing twelfth rib fracture on the right. There is a possible soft tissue hematoma in the left gluteal region. IMPRESSION: 1. No acute solid organ injury identified. 2. Possible soft tissue hematoma involving the left gluteal region. 3. Bilateral nephrolithiasis. Electronically Signed   By:  Constance Holster M.D.   On: 06/03/2019 22:37    ____________________________________________    PROCEDURES  Procedure(s) performed:    Procedures    Medications  morphine 4 MG/ML injection 4 mg (4 mg Intravenous Given 06/03/19 2054)  iohexol (OMNIPAQUE) 300 MG/ML solution 100 mL (100 mLs Intravenous Contrast Given 06/03/19 2214)  traMADol (ULTRAM) tablet 50 mg (50 mg Oral Given 06/03/19 2348)     ____________________________________________   INITIAL IMPRESSION / ASSESSMENT AND PLAN / ED COURSE  Pertinent labs & imaging results that were available during my care of the patient were reviewed by me and considered in my medical decision making (see chart for details).  Review of the [place] CSRS was performed in accordance of the [place] prior to dispensing any controlled drugs.         Assessment and plan Fall 58 year old male presents to the emergency department with anterior chest wall discomfort and abdominal discomfort after a fall that occurred 3 days ago.  Patient was hypertensive in triage but vital signs were otherwise  stable.  He did have pain to palpation in the left upper abdominal quadrant and ecchymosis along abdomen.  Differential diagnosis included spleen laceration, pneumothorax, rib fracture..  There is no evidence of pneumothorax or rib fracture on chest x-ray.  Basic labs were obtained in the emergency department were reassuring.  CT abdomen and pelvis revealed no evidence of acute abnormality.  Patient was discharged with a short course of tramadol and advised to follow-up with primary care as needed.  All patient questions were answered.  ____________________________________________  FINAL CLINICAL IMPRESSION(S) / ED DIAGNOSES  Final diagnoses:  Contusion of abdominal wall, initial encounter      NEW MEDICATIONS STARTED DURING THIS VISIT:  ED Discharge Orders         Ordered    traMADol (ULTRAM) 50 MG tablet  Every 6 hours PRN     06/03/19 2305    ondansetron (ZOFRAN) 4 MG tablet  Every 8 hours PRN     06/03/19 2305              This chart was dictated using voice recognition software/Dragon. Despite best efforts to proofread, errors can occur which can change the meaning. Any change was purely unintentional..j   Vallarie Mare [place], [place] 06/04/19 0047    Nena Polio, MD 06/04/19 [id number]

## 2019-06-06 ENCOUNTER — Ambulatory Visit: Payer: Medicaid Other | Admitting: Licensed Clinical Social Worker

## 2019-06-06 ENCOUNTER — Other Ambulatory Visit: Payer: Self-pay

## 2019-06-06 DIAGNOSIS — F4321 Adjustment disorder with depressed mood: Secondary | ICD-10-CM

## 2019-06-06 DIAGNOSIS — F1020 Alcohol dependence, uncomplicated: Secondary | ICD-10-CM

## 2019-06-06 NOTE — BH Specialist Note (Signed)
Integrated Behavioral Health Follow Up Visit Via Phone  MRN: [id number] Name: Miguel [name]  Type of Service: [place] Interpretor:No. Interpretor Name and Language: Not applicable.   SUBJECTIVE: Miguel [name] is a 58 y.o. male accompanied by himself. Patient was referred by Carlyon Shadow, NP for mental health. Patient reports the following symptoms/concerns: He notes that he is still experiencing pain and trying to not take the narcotic medication that was prescribed to him in the ER for pain. He notes that its hard to be able to lie down and get in a comfortable position where he doesn't feel pain. He notes that the last time he had a drink of alcohol was a couple of weeks. He reports that he takes the Naltrexone every single day along with his other prescription medications. He reports that he feels like the Naltrexone helps with the drinking and he is drinking way less since starting the medication. He denies suicidal and homicidal thoughts.  Duration of problem: ; Severity of problem: mild  OBJECTIVE: Mood: Euthymic and Affect: Appropriate Risk of harm to self or others: No plan to harm self or others  LIFE CONTEXT: Family and Social: See above. School/Work:See above. Self-Care: See above. Life Changes: See above.  GOALS ADDRESSED: Patient will: 1.  Reduce symptoms of: stress  2.  Increase knowledge and/or ability of: coping skills, healthy habits, self-management skills and stress reduction  3.  Demonstrate ability to: Increase healthy adjustment to current life circumstances  INTERVENTIONS: Interventions utilized:  Brief CBT was utilized by the clinician focusing on relapse prevention and stress. Clinician processed with the patient regarding how he has been doing since the last follow up session. Clinician explained to the patient that she has seen that he has gone to the ER a few times. Clinician asked the patient if he has had any drinks  of alcohol in the last week. Clinician explained to the patient that she finds it odd that it hasn't been drinking because his blood alcohol level on his visit to the ER on July 11th was 222. Clinician explained to the patient that typically with a blood alcohol level in that range that he would have had to drank alcohol in less than 24 hours. Clinician asked the patient with his frequent trips to the ER, falls, and relapse episodes does he believe that the Naltrexone is helping him. Clinician explained to the patient that its important to keep his appointments in clinic because he could avoid trips to the ER if he saw his pcp at [facility].  Standardized Assessments completed: next session  ASSESSMENT: Patient currently experiencing see above.   Patient may benefit from see above.  PLAN: 1. Follow up with behavioral health clinician on : one week or earlier if needed. 2. Behavioral recommendations: see above. 3. Referral(s): [place] (In Clinic) 4. "From scale of 1-10, how likely are you to follow plan?":   Bayard Hugger, LCSW

## 2019-06-13 ENCOUNTER — Other Ambulatory Visit: Payer: Self-pay

## 2019-06-13 ENCOUNTER — Emergency Department: Payer: Self-pay

## 2019-06-13 ENCOUNTER — Encounter: Payer: Self-pay | Admitting: *Deleted

## 2019-06-13 ENCOUNTER — Emergency Department
Admission: EM | Admit: 2019-06-13 | Discharge: 2019-06-14 | Disposition: A | Discharge status: Home / Self Care | Payer: Self-pay | Attending: Emergency Medicine | Admitting: Emergency Medicine

## 2019-06-13 ENCOUNTER — Ambulatory Visit: Payer: Medicaid Other | Admitting: Licensed Clinical Social Worker

## 2019-06-13 DIAGNOSIS — E860 Dehydration: Secondary | ICD-10-CM | POA: Insufficient documentation

## 2019-06-13 DIAGNOSIS — Z87891 Personal history of nicotine dependence: Secondary | ICD-10-CM | POA: Insufficient documentation

## 2019-06-13 DIAGNOSIS — F4321 Adjustment disorder with depressed mood: Secondary | ICD-10-CM

## 2019-06-13 DIAGNOSIS — W19XXXA Unspecified fall, initial encounter: Secondary | ICD-10-CM | POA: Insufficient documentation

## 2019-06-13 DIAGNOSIS — Y9389 Activity, other specified: Secondary | ICD-10-CM | POA: Insufficient documentation

## 2019-06-13 DIAGNOSIS — Z79899 Other long term (current) drug therapy: Secondary | ICD-10-CM | POA: Insufficient documentation

## 2019-06-13 DIAGNOSIS — Y998 Other external cause status: Secondary | ICD-10-CM | POA: Insufficient documentation

## 2019-06-13 DIAGNOSIS — Y9289 Other specified places as the place of occurrence of the external cause: Secondary | ICD-10-CM | POA: Insufficient documentation

## 2019-06-13 DIAGNOSIS — F1020 Alcohol dependence, uncomplicated: Secondary | ICD-10-CM

## 2019-06-13 DIAGNOSIS — J45909 Unspecified asthma, uncomplicated: Secondary | ICD-10-CM | POA: Insufficient documentation

## 2019-06-13 DIAGNOSIS — I10 Essential (primary) hypertension: Secondary | ICD-10-CM | POA: Insufficient documentation

## 2019-06-13 LAB — CBC
HCT: 39.7 % (ref 39.0–52.0)
Hemoglobin: 12.4 g/dL — ABNORMAL LOW (ref 13.0–17.0)
MCH: 26.6 pg (ref 26.0–34.0)
MCHC: 31.2 g/dL (ref 30.0–36.0)
MCV: 85.2 fL (ref 80.0–100.0)
Platelets: 374 10*3/uL (ref 150–400)
RBC: 4.66 MIL/uL (ref 4.22–5.81)
RDW: 18.3 % — ABNORMAL HIGH (ref 11.5–15.5)
WBC: 7.1 10*3/uL (ref 4.0–10.5)
nRBC: 0 % (ref 0.0–0.2)

## 2019-06-13 LAB — BASIC METABOLIC PANEL
Anion gap: 11 (ref 5–15)
BUN: 13 mg/dL (ref 6–20)
CO2: 22 mmol/L (ref 22–32)
Calcium: 8.5 mg/dL — ABNORMAL LOW (ref 8.9–10.3)
Chloride: 105 mmol/L (ref 98–111)
Creatinine, Ser: 1.24 mg/dL (ref 0.61–1.24)
GFR calc Af Amer: >60 mL/min (ref >60)
GFR calc non Af Amer: >60 mL/min (ref >60)
Glucose, Bld: 141 mg/dL — ABNORMAL HIGH (ref 70–99)
Potassium: 3.7 mmol/L (ref 3.5–5.1)
Sodium: 138 mmol/L (ref 135–145)

## 2019-06-13 LAB — TROPONIN I (HIGH SENSITIVITY): Troponin I (High Sensitivity): 3 ng/L (ref <18)

## 2019-06-13 MED ORDER — SODIUM CHLORIDE 0.9% FLUSH: 3.0000 mL | Freq: Once | INTRAVENOUS | Status: DC

## 2019-06-13 MED ORDER — SODIUM CHLORIDE 0.9 % IV BOLUS
1000.0000 mL | Freq: Once | INTRAVENOUS | Status: AC
  Administered 2019-06-13: 1000 mL via INTRAVENOUS

## 2019-06-13 NOTE — ED Triage Notes (Signed)
Pt to ED reporting dizziness that started 2 hours ago. Pt fell walking through his house due to the dizziness and reports pain in his left arm and shoulder. No LOC reported and no head injury. Pt also reporting SOB at this time. No cough or congestion. No exposure to COVID.   While walking tot triage room pt stumbled and held onto a chair and stated, "I am getting worse I just do not feel good."

## 2019-06-13 NOTE — BH Specialist Note (Signed)
Integrated Behavioral Health Follow Up Visit Via Phone   MRN: [id number] Name: Miguel [name]  Type of Service: [place] Interpretor:No. Interpretor Name and Language: Not applicable.  SUBJECTIVE: Miguel [name] is a 58 y.o. male accompanied by himself. Patient was referred by Carlyon Shadow NP for mental health. Patient reports the following symptoms/concerns: He reports that he is trying to only leave the house when necessary due to the heat. He notes that his pain not as bad and only takes the pain medication when necessary. He reports that he is feeling really good and is doing better. He reports that he is trying to take things easy. He clarified his phone visit with Benjamine Mola this week. He notes that his wife's in home health aid is supposed to come from 11 am to 1 pm but the aid is only staying for an hour rather than the full two hours. He notes that the owner of the company stopped by and asked how things were going. He notes that the owner stated that he will have the issue resolved. He notes that he is doing well on his all medications. He denies drinking any alcohol and hasn't had any visits to the ER. He denies suicidal and homicidal thoughts.  Duration of problem: ; Severity of problem: mild  OBJECTIVE: Mood: Euthymic and Affect: Appropriate Risk of harm to self or others: No plan to harm self or others  LIFE CONTEXT: Family and Social: See above. School/Work: See above. Self-Care: See above. Life Changes: See above.   GOALS ADDRESSED: Patient will: 1.  Reduce symptoms of: relapse prevention  2.  Increase knowledge and/or ability of: coping skills, healthy habits, self-management skills and stress reduction  3.  Demonstrate ability to: Increase healthy adjustment to current life circumstances  INTERVENTIONS: Interventions utilized:  Relapse prevention was utilized by the clinician during today's follow up session. Clinician processed with  the patient regarding how he has been doing since the last follow up session. Clinician asked the patient when was the last time he had a drink of alcohol. Clinician explained to the patient that she noticed that he hasn't been to the emergency room since July 18th. Clinician encouraged the patient to continue to keep busy with distractions, focusing on taking care of his wife, and house hold projects to occupy his times so he isn't thinking about things that are out of his control leading him to drink.  Standardized Assessments completed: GAD-7 and PHQ 9  ASSESSMENT: Patient currently experiencing see above.   Patient may benefit from see above.  PLAN: 1. Follow up with behavioral health clinician on : one week or earlier if needed. 2. Behavioral recommendations: see above. 3. Referral(s): [place] (In Clinic) 4. "From scale of 1-10, how likely are you to follow plan?":   Bayard Hugger, LCSW

## 2019-06-13 NOTE — ED Provider Notes (Signed)
[facility] Emergency Department Provider Note   ____________________________________________   I have reviewed the triage vital signs and the nursing notes.   HISTORY  Chief Complaint Dizziness and Fall   History limited by: Not Limited   HPI Miguel [name] is a 58 y.o. male who presents to the emergency department today after a fall and some dizziness.  Patient states that he was helping his wife up a ramp when he fell.  Fell onto his left side.  He had previously injured this side a few weeks ago with another fall.  He also describes some dizziness since the fall.  Patient states that earlier today he had been in his normal state of health.  He states he ate and drink his normal amount.  Denies any fevers, chest pain or shortness of breath.  Records reviewed. Per medical record review patient has a history of frequent ER visits.   Past Medical History:  Diagnosis Date  . Alcohol abuse   . Asthma   . GERD (gastroesophageal reflux disease)   . Gout   . Hypertension   . Kidney stone   . OCD (obsessive compulsive disorder)   . Renal colic     Patient Active Problem List   Diagnosis Date Noted  . AKI (acute kidney injury) ([place]) 05/27/2019  . Alcohol withdrawal ([place]) 05/07/2019  . Alcoholic intoxication with complication ([place])   . Sepsis ([place]) 03/08/2019  . Breast lump or mass 10/04/2018  . Sepsis secondary to UTI ([place]) 09/14/2018  . Asthma 07/07/2018  . GERD (gastroesophageal reflux disease) 07/07/2018  . OCD (obsessive compulsive disorder) 07/07/2018  . Self-inflicted laceration of wrist 03/10/2018  . Leg hematoma 12/25/2017  . Suicide and self-inflicted injury by cutting and piercing instrument ([place]) 03/10/2017  . Severe recurrent major 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 ([place]) 03/09/2017  . Substance induced mood disorder ([place]) 08/15/2016  . Involuntary commitment 08/15/2016  . Alcohol use disorder, severe, dependence ([place]) 02/05/2016  .  Hypertension 12/05/2015  . Tachycardia 12/05/2015  . Gout 11/13/2015  . Chronic back pain 05/02/2015    Past Surgical History:  Procedure Laterality Date  . CHOLECYSTECTOMY  2012  . EXTRACORPOREAL SHOCK WAVE LITHOTRIPSY Left 01/12/2019   Procedure: EXTRACORPOREAL SHOCK WAVE LITHOTRIPSY (ESWL);  Surgeon: Billey Co, MD;  Location: [facility] ORS;  Service: Urology;  Laterality: Left;    Prior to Admission medications   Medication Sig Start Date End Date Taking? Authorizing Provider  albuterol (VENTOLIN HFA) 108 (90 Base) MCG/ACT inhaler Inhale 2 puffs into the lungs every 4 (four) hours as needed. Patient taking differently: Inhale 2 puffs into the lungs every 4 (four) hours as needed for wheezing or shortness of breath.  09/01/18   Tukov-Yual, Arlyss Gandy, NP  allopurinol (ZYLOPRIM) 300 MG tablet Take 1 tablet (300 mg total) by mouth daily. 09/01/18   Tukov-Yual, Arlyss Gandy, NP  colchicine 0.6 MG tablet Take 1 tablet (0.6 mg total) by mouth daily. Patient taking differently: Take 0.6 mg by mouth daily as needed.  03/09/19   Lang Snow, NP  docusate sodium (COLACE) 100 MG capsule Take 1 tablet once or twice daily as needed for constipation while taking narcotic pain medicine 01/09/19   Hinda Kehr, MD  Fluticasone-Salmeterol (ADVAIR DISKUS) 100-50 MCG/DOSE AEPB Inhale 1 puff into the lungs 2 (two) times daily. 09/01/18   Tukov-Yual, Arlyss Gandy, NP  folic acid (FOLVITE) 1 MG tablet Take 1 tablet (1 mg total) by mouth daily. 09/01/18   Maura Crandall  S, NP  gabapentin (NEURONTIN) 100 MG capsule Take 2 capsules (200 mg total) by mouth 3 (three) times daily. 04/21/19   Patrecia Pour, NP  lisinopril (PRINIVIL,ZESTRIL) 20 MG tablet Take 1 tablet (20 mg total) by mouth daily. 02/21/19   Iloabachie, Chioma E, NP  Multiple Vitamins-Minerals (MULTIVITAMIN WITH MINERALS) tablet Take 1 tablet by mouth daily. 09/01/18   Tukov-Yual, Arlyss Gandy, NP  oxyCODONE (OXY IR/ROXICODONE) 5 MG  immediate release tablet Take 1 tablet (5 mg total) by mouth every 6 (six) hours as needed for severe pain. 05/28/19   Hillary Bow, MD  promethazine (PHENERGAN) 12.5 MG tablet Take 1 tablet (12.5 mg total) by mouth every 6 (six) hours as needed. 05/17/19   Merlyn Lot, MD  tiZANidine (ZANAFLEX) 4 MG tablet Take 0.5 tablets (2 mg total) by mouth every 8 (eight) hours as needed for muscle spasms. 04/26/19   Iloabachie, Chioma E, NP    Allergies Patient has no known allergies.  Family History  Problem Relation Age of Onset  . Alcohol abuse Father   . Breast cancer Mother 53    Social History Social History   Tobacco Use  . Smoking status: Former Research scientist (life sciences)  . Smokeless tobacco: Never Used  . Tobacco comment: quit 30 years ago  Substance Use Topics  . Alcohol use: Yes    Alcohol/week: 2.0 standard drinks    Types: 2 Glasses of wine per week    Comment: daily  . Drug use: No    Review of Systems Constitutional: No fever/chills Eyes: No visual changes. ENT: No sore throat. Cardiovascular: Positive for left sided chest pain. Respiratory: Denies shortness of breath. Gastrointestinal: No abdominal pain.  No nausea, no vomiting.  No diarrhea.   Genitourinary: Negative for dysuria. Musculoskeletal: Negative for back pain. Skin: Negative for rash. Neurological: Positive for dizziness. ____________________________________________   PHYSICAL EXAM:  VITAL SIGNS: ED Triage Vitals  Enc Vitals Group     BP 06/13/19 2151 (!) 57/51     Pulse Rate 06/13/19 2151 80     Resp 06/13/19 2151 16     Temp 06/13/19 2151 98.3 F (36.8 C)     Temp Source 06/13/19 2151 Oral     SpO2 06/13/19 2151 96 %     Weight 06/13/19 2152 185 lb (83.9 kg)     Height 06/13/19 2152 6' (1.829 m)     Head Circumference --      Peak Flow --      Pain Score 06/13/19 2152 8    Constitutional: Alert and oriented.  Eyes: Conjunctivae are normal.  ENT      Head: Normocephalic and atraumatic.      Nose:  No congestion/rhinnorhea.      Mouth/Throat: Mucous membranes are moist.      Neck: No stridor. Hematological/Lymphatic/Immunilogical: No cervical lymphadenopathy. Cardiovascular: Normal rate, regular rhythm.  No murmurs, rubs, or gallops.  Respiratory: Normal respiratory effort without tachypnea nor retractions. Breath sounds are clear and equal bilaterally. No wheezes/rales/rhonchi. Gastrointestinal: Soft and non tender. No rebound. No guarding.  Genitourinary: Deferred Musculoskeletal: Normal range of motion in all extremities. No lower extremity edema. Neurologic:  Normal speech and language. No gross focal neurologic deficits are appreciated.  Skin:  Skin is warm, dry and intact. No bruising.  Psychiatric: Mood and affect are normal. Speech and behavior are normal. Patient exhibits appropriate insight and judgment.  ____________________________________________    LABS (pertinent positives/negatives)  CBC wbc 7.1, hgb 12.4, plt 374 BMP wnl except glu  141, ca 8.5  ____________________________________________   EKG  I, Nance Pear, attending physician, personally viewed and interpreted this EKG  EKG Time: 2149 Rate: 82 Rhythm: normal sinus rhythm Axis: left axis deviation Intervals: qtc 462 QRS: narrow, q waves V1, III ST changes: no st elevation Impression: abnormal ekg   ____________________________________________    RADIOLOGY  CXR No acute disease  ____________________________________________   PROCEDURES  Procedures  ____________________________________________   INITIAL IMPRESSION / ASSESSMENT AND PLAN / ED COURSE  Pertinent labs & imaging results that were available during my care of the patient were reviewed by me and considered in my medical decision making (see chart for details).   Patient presented to the emergency department today because of concern for fall and dizziness. Blood pressure was low at triage. Patient denies any infectious  type symptoms and is afebrile. Will check blood work and give IVFs.    ____________________________________________   FINAL CLINICAL IMPRESSION(S) / ED DIAGNOSES  Dizziness  Note: This dictation was prepared with Dragon dictation. Any transcriptional errors that result from this process are unintentional     Nance Pear, MD 06/14/19 1529

## 2019-06-13 NOTE — ED Notes (Signed)
Patient transported to X-ray 

## 2019-06-14 ENCOUNTER — Ambulatory Visit: Payer: Medicaid Other | Admitting: Gerontology

## 2019-06-14 DIAGNOSIS — F1010 Alcohol abuse, uncomplicated: Secondary | ICD-10-CM

## 2019-06-14 DIAGNOSIS — K7030 Alcoholic cirrhosis of liver without ascites: Secondary | ICD-10-CM

## 2019-06-14 DIAGNOSIS — I10 Essential (primary) hypertension: Secondary | ICD-10-CM

## 2019-06-14 DIAGNOSIS — R0789 Other chest pain: Secondary | ICD-10-CM

## 2019-06-14 LAB — URINALYSIS, COMPLETE (UACMP) WITH MICROSCOPIC
Bacteria, UA: NONE SEEN
Bilirubin Urine: NEGATIVE
Glucose, UA: NEGATIVE mg/dL
Hgb urine dipstick: NEGATIVE
Ketones, ur: NEGATIVE mg/dL
Leukocytes,Ua: NEGATIVE
Nitrite: NEGATIVE
Protein, ur: NEGATIVE mg/dL
Specific Gravity, Urine: 1.014 (ref 1.005–1.030)
Squamous Epithelial / HPF: NONE SEEN (ref 0–5)
pH: 5 (ref 5.0–8.0)

## 2019-06-14 LAB — TROPONIN I (HIGH SENSITIVITY): Troponin I (High Sensitivity): 2 ng/L (ref <18)

## 2019-06-14 MED ORDER — SODIUM CHLORIDE 0.9 % IV BOLUS: 1000.0000 mL | Freq: Once | INTRAVENOUS | Status: DC

## 2019-06-14 MED ORDER — DICLOFENAC SODIUM 1 % TD GEL: 2.0000 g | Freq: Four times a day (QID) | TRANSDERMAL | 0 refills | Status: DC

## 2019-06-14 NOTE — ED Provider Notes (Signed)
_________________________ 2:31 AM on 06/14/2019 -----------------------------------------   Vitals:   06/14/19 0130 06/14/19 0200  BP: 123/76 122/78  Pulse: 74 74  Resp: 13 14  Temp:    SpO2: 96% 97%    Patient looks extremely well-appearing with no signs of infection.  Reports that he spent most of the day working in the yard in the heat, not drinking enough.  His labs look at baseline.  His blood pressure has improved after IV fluids.  He feels great and is requesting to be discharged home to take care of his wife.  Will discharge patient home with follow-up with PCP.  Recommended increase oral hydration.  Discussed my standard return precautions   Rudene Re, MD 06/14/19 [phone]

## 2019-06-14 NOTE — Progress Notes (Signed)
Established Patient Office Visit  Subjective:  Patient ID: Miguel [name], male    DOB: [birth year]  Age: 58 y.o. MRN: [id number]  CC:  Chief Complaint  Patient presents with  . Follow-up    pain in ribs  Patient consents to telephone visit and 2 patient identifiers was used to identify patient.  HPI Miguel [name] presents for follow up after being evaluated at the ED yesterday for dizziness and hypotension. He was treated with IV fluids and discharged. He reports that he had no fall, but "likely bruised/ strained his left thoracic area while pushing his wife up the wheel chair ramp yesterday". Chest x ray was done during his ED visit and it showed no active cardiopulmonary disease, minimal atelectasis at the left base. Currently he reports that he is doing good, and continues to experience intermittent non radiating 4/10 dull pain to left thoracic side during inhalation. He also states that " it hurts if he sleeps on his left side". He reports that he has not checked his blood pressure today, but it's normal and he has increased his water consumption. He denies dizziness, light headedness, chest pain and palpitation. He states that he has not consumed alcoholic beverages for 1 week, denies tremor, withdrawal symptoms and no further concerns.   Past Medical History:  Diagnosis Date  . Alcohol abuse   . Asthma   . GERD (gastroesophageal reflux disease)   . Gout   . Hypertension   . Kidney stone   . OCD (obsessive compulsive disorder)   . Renal colic     Past Surgical History:  Procedure Laterality Date  . CHOLECYSTECTOMY  2012  . EXTRACORPOREAL SHOCK WAVE LITHOTRIPSY Left 01/12/2019   Procedure: EXTRACORPOREAL SHOCK WAVE LITHOTRIPSY (ESWL);  Surgeon: Billey Co, MD;  Location: [facility] ORS;  Service: Urology;  Laterality: Left;    Family History  Problem Relation Age of Onset  . Alcohol abuse Father   . Breast cancer Mother 23    Social History   Socioeconomic History  .  Marital status: Married    Spouse name: Not on file  . Number of children: Not on file  . Years of education: Not on file  . Highest education level: Not on file  Occupational History  . Not on file  Social Needs  . Financial resource strain: Very hard  . Food insecurity    Worry: Never true    Inability: Never true  . Transportation needs    Medical: No    Non-medical: No  Tobacco Use  . Smoking status: Former Research scientist (life sciences)  . Smokeless tobacco: Never Used  . Tobacco comment: quit 30 years ago  Substance and Sexual Activity  . Alcohol use: Yes    Alcohol/week: 2.0 standard drinks    Types: 2 Glasses of wine per week    Comment: daily  . Drug use: No  . Sexual activity: Yes  Lifestyle  . Physical activity    Days per week: 2 days    Minutes per session: 10 min  . Stress: Very much  Relationships  . Social Herbalist on phone: Once a week    Gets together: Not on file    Attends religious service: Never    Active member of club or organization: No    Attends meetings of clubs or organizations: Never    Relationship status: Married  . Intimate partner violence    Fear of current or ex partner: No  Emotionally abused: No    Physically abused: No    Forced sexual activity: No  Other Topics Concern  . Not on file  Social History Narrative   Lives at home with his wife and takes care of his wife. Independent at baseline      - Biggest strain is financial but doesn't think they could got get more help.      Patient expressed interest in possible financial assistance. Informed written consent obtained    Outpatient Medications Prior to Visit  Medication Sig Dispense Refill  . albuterol (VENTOLIN HFA) 108 (90 Base) MCG/ACT inhaler Inhale 2 puffs into the lungs every 4 (four) hours as needed. (Patient taking differently: Inhale 2 puffs into the lungs every 4 (four) hours as needed for wheezing or shortness of breath. ) 72 g 0  . allopurinol (ZYLOPRIM) 300 MG tablet  Take 1 tablet (300 mg total) by mouth daily. 90 tablet 3  . colchicine 0.6 MG tablet Take 1 tablet (0.6 mg total) by mouth daily. (Patient taking differently: Take 0.6 mg by mouth daily as needed. ) 4 tablet 0  . docusate sodium (COLACE) 100 MG capsule Take 1 tablet once or twice daily as needed for constipation while taking narcotic pain medicine 30 capsule 0  . Fluticasone-Salmeterol (ADVAIR DISKUS) 100-50 MCG/DOSE AEPB Inhale 1 puff into the lungs 2 (two) times daily. 786 each 0  . folic acid (FOLVITE) 1 MG tablet Take 1 tablet (1 mg total) by mouth daily. 90 tablet 3  . gabapentin (NEURONTIN) 100 MG capsule Take 2 capsules (200 mg total) by mouth 3 (three) times daily. 90 capsule 2  . lisinopril (PRINIVIL,ZESTRIL) 20 MG tablet Take 1 tablet (20 mg total) by mouth daily. 30 tablet 1  . Multiple Vitamins-Minerals (MULTIVITAMIN WITH MINERALS) tablet Take 1 tablet by mouth daily. 90 tablet 3  . promethazine (PHENERGAN) 12.5 MG tablet Take 1 tablet (12.5 mg total) by mouth every 6 (six) hours as needed. 12 tablet 0  . tiZANidine (ZANAFLEX) 4 MG tablet Take 0.5 tablets (2 mg total) by mouth every 8 (eight) hours as needed for muscle spasms. 30 tablet 1  . oxyCODONE (OXY IR/ROXICODONE) 5 MG immediate release tablet Take 1 tablet (5 mg total) by mouth every 6 (six) hours as needed for severe pain. 20 tablet 0   No facility-administered medications prior to visit.     No Known Allergies  ROS Review of Systems  Constitutional: Negative.   Eyes: Negative.   Respiratory: Negative.   Cardiovascular: Negative.   Skin: Negative.   Neurological: Negative.  Negative for dizziness and light-headedness.  Psychiatric/Behavioral: Negative.       Objective:    Physical Exam No vital sign or PE was done. There were no vitals taken for this visit. Wt Readings from Last 3 Encounters:  06/13/19 185 lb (83.9 kg)  06/03/19 185 lb (83.9 kg)  05/28/19 185 lb (83.9 kg)     Health Maintenance Due   Topic Date Due  . Hepatitis C Screening  [birth year]  . COLONOSCOPY  10/11/2009    There are no preventive care reminders to display for this patient.  Lab Results  Component Value Date   TSH 0.545 05/07/2019   Lab Results  Component Value Date   WBC 7.1 06/13/2019   HGB 12.4 (L) 06/13/2019   HCT 39.7 06/13/2019   MCV 85.2 06/13/2019   PLT 374 06/13/2019   Lab Results  Component Value Date   NA 138 06/13/2019  K 3.7 06/13/2019   CO2 22 06/13/2019   GLUCOSE 141 (H) 06/13/2019   BUN 13 06/13/2019   CREATININE 1.24 06/13/2019   BILITOT 0.5 06/03/2019   ALKPHOS 63 06/03/2019   AST 13 (L) 06/03/2019   ALT 10 06/03/2019   PROT 6.9 06/03/2019   ALBUMIN 3.7 06/03/2019   CALCIUM 8.5 (L) 06/13/2019   ANIONGAP 11 06/13/2019   Lab Results  Component Value Date   CHOL 144 03/12/2017   Lab Results  Component Value Date   HDL 52 03/12/2017   Lab Results  Component Value Date   LDLCALC 71 03/12/2017   Lab Results  Component Value Date   TRIG 107 03/12/2017   Lab Results  Component Value Date   CHOLHDL 2.8 03/12/2017   Lab Results  Component Value Date   HGBA1C 5.1 03/12/2017      Assessment & Plan:     1. Left-sided chest wall pain - Apply Diclofenac gel to left sided chest wall pain. - Notify clinic for worsening symptoms.  2. Essential hypertension - He will continue on current treatment regimen. -Low salt DASH diet -Take medications regularly on time -Exercise regularly as tolerated -Check blood pressure daily at home record and bring log to clinic. -Goal is less than 140/90 and normal blood pressure is 120/80  3. Alcohol abuse - He was strongly encouraged on alcohol abstinence.   Follow-up: Return in about 3 weeks (around 07/05/2019), or if symptoms worsen or fail to improve.    Kristiana Jacko Jerold Coombe, NP

## 2019-06-15 IMAGING — CR DG CHEST 2V
2 series · 2 of 2 positions shown · non-contrast
Comparison: Chest radiograph dated 04/01/2018

CLINICAL DATA: 56-year-old male with fever.

EXAM:
CHEST - 2 VIEW

[chest lat]
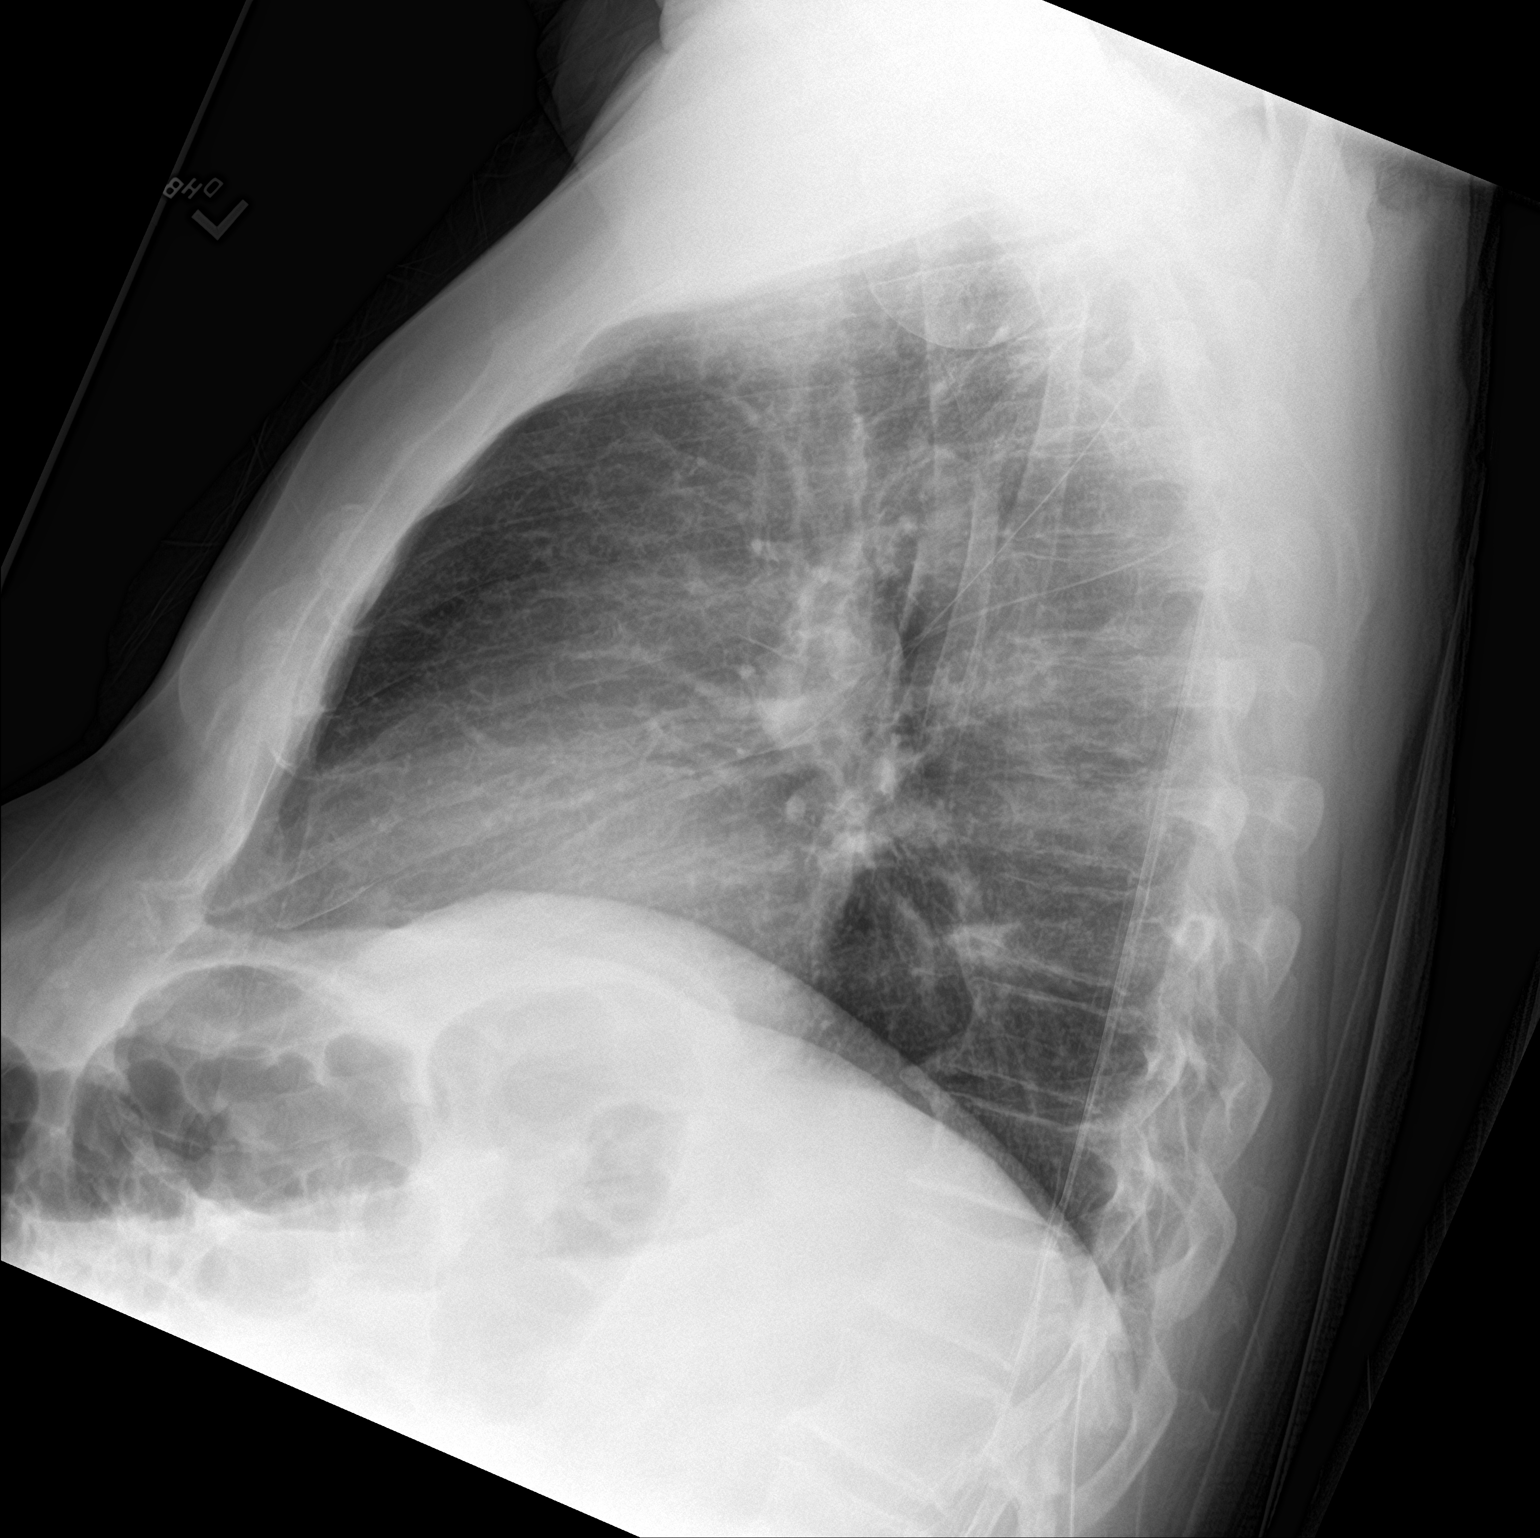

[chest ap]
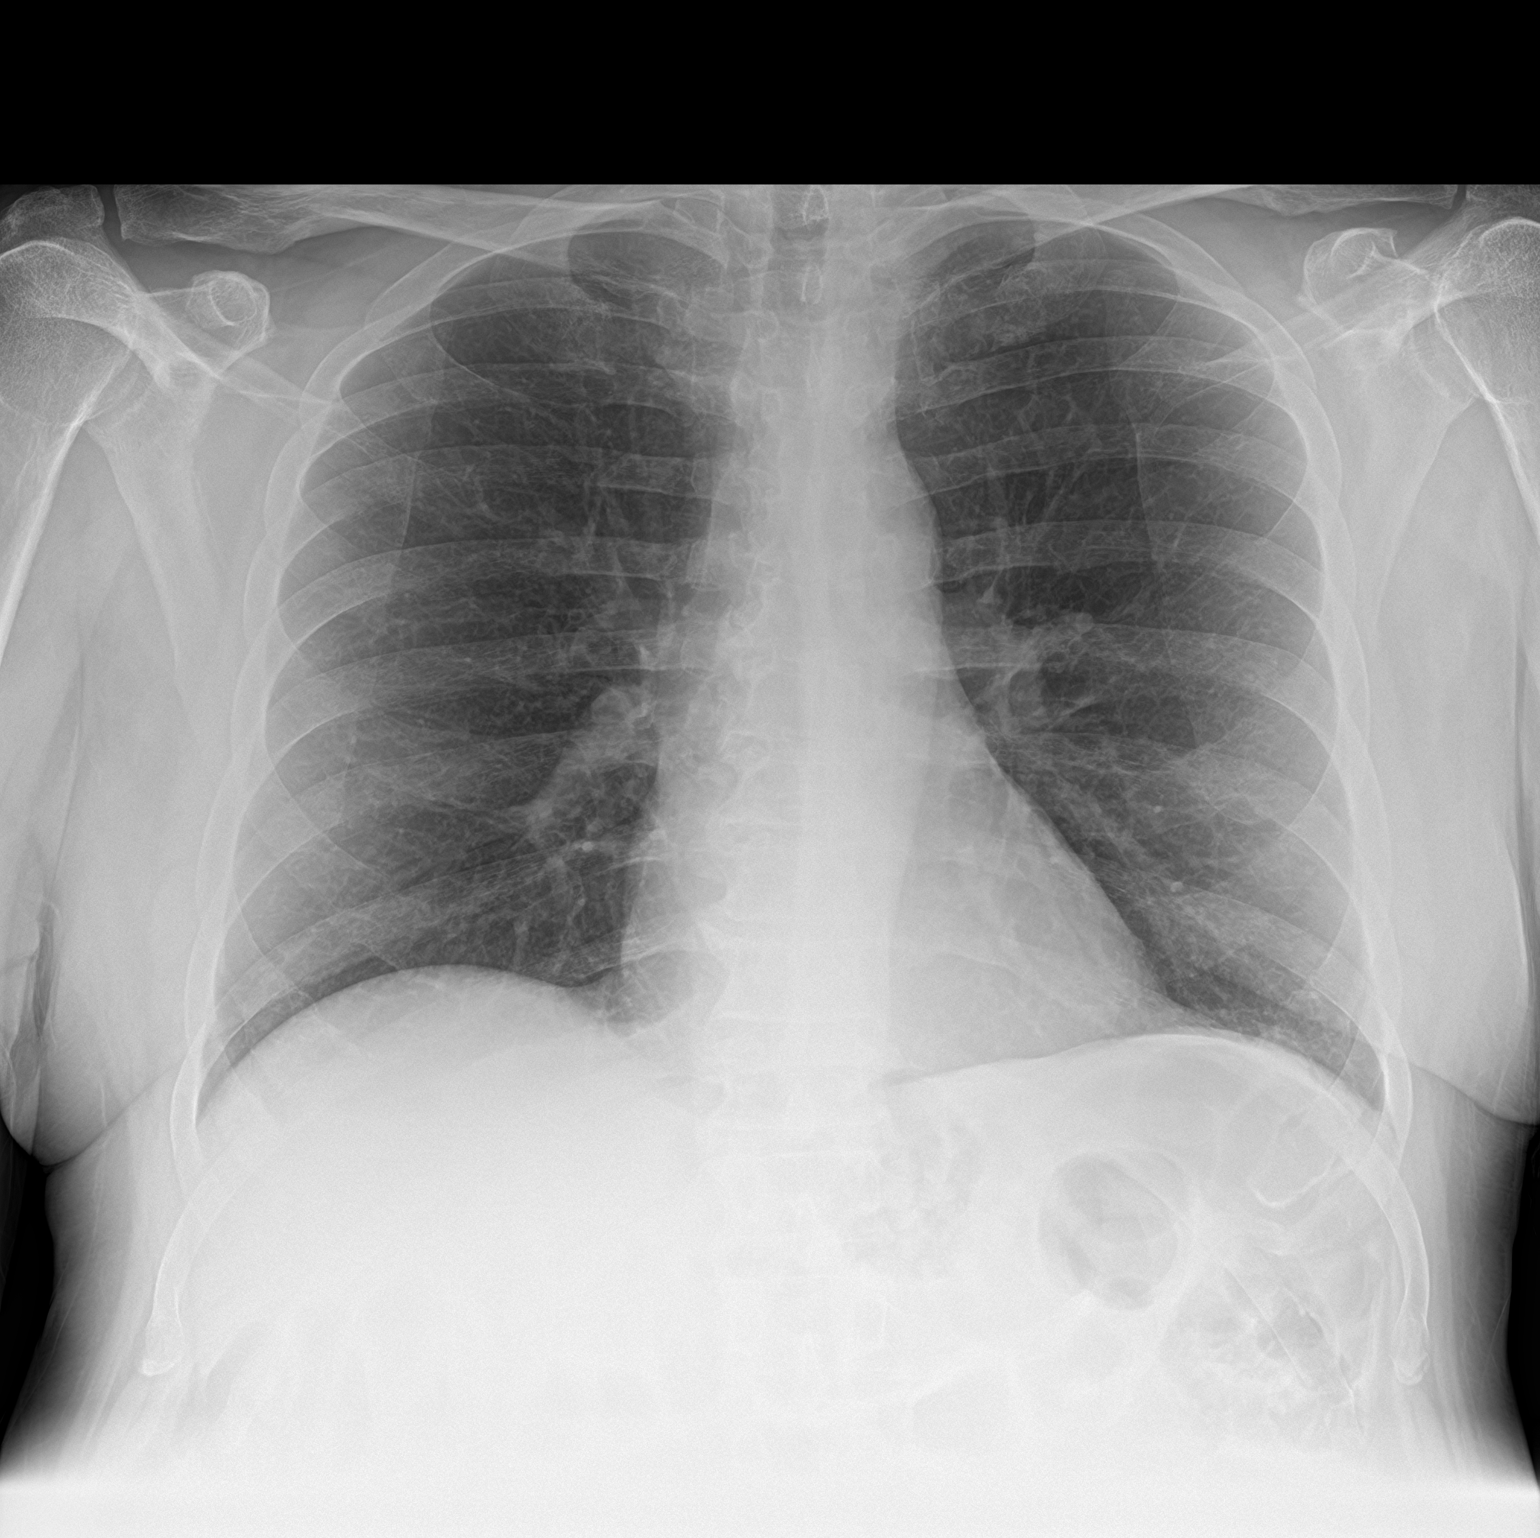

[2 of 2 positions shown; findings below may reference images not displayed]

FINDINGS: The heart size and mediastinal contours are within normal limits.
Both lungs are clear. The visualized skeletal structures are
unremarkable.
IMPRESSION: No active cardiopulmonary disease.

## 2019-06-15 IMAGING — CT CT CHEST W/ CM
2 of 5 series · 12 of 36 positions shown, 15 images · IV contrast (omnipaque)
Comparison: Chest radiograph dated 09/14/2018 and CT dated
07/27/2016

CLINICAL DATA: 56-year-old male with sepsis. Chest and abdominal
pain and diarrhea.

EXAM:
CT CHEST, ABDOMEN, AND PELVIS WITH CONTRAST
TECHNIQUE: Multidetector CT imaging of the chest, abdomen and pelvis was
performed following the standard protocol during bolus
administration of intravenous contrast.
CONTRAST:  100mL OMNIPAQUE IOHEXOL 300 MG/ML  SOLN

[Series 2: cap with · axial · 0.72mm/px · z∈[-999,-469]mm · 9 of 134 slices shown, 12 images]
[im 14/134  mediastinal]
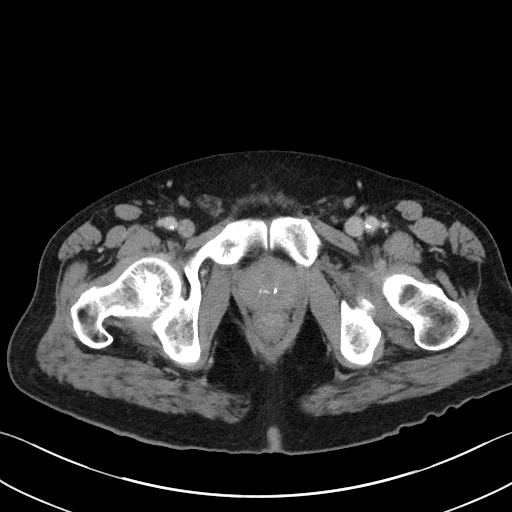
[im 14/134  lung]
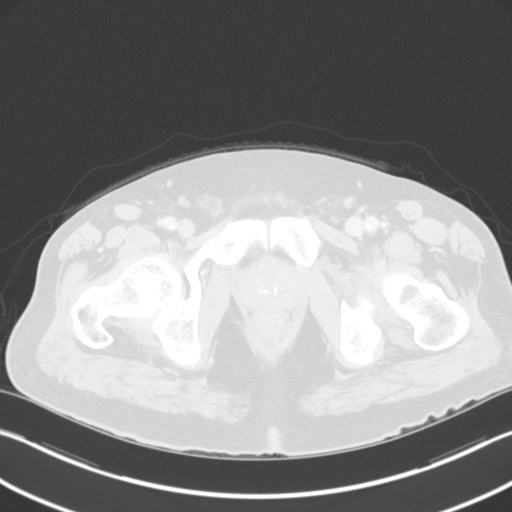
[im 27/134  lung]
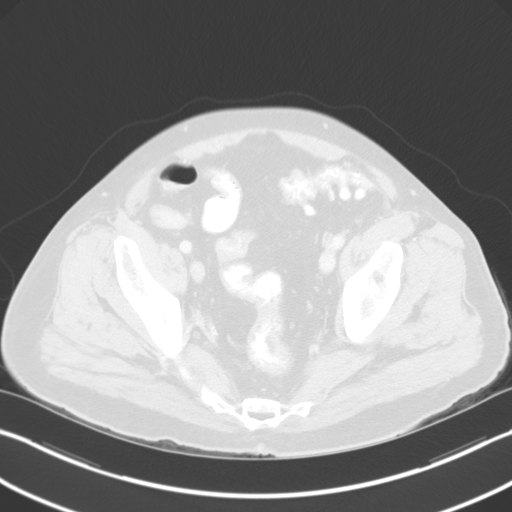
[im 40/134  lung]
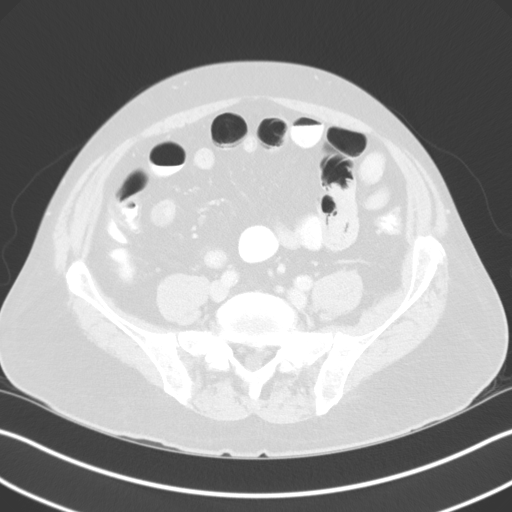
[im 54/134  lung]
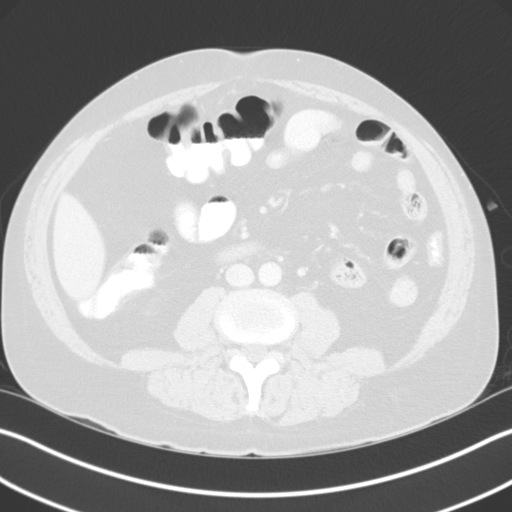
[im 67/134  mediastinal]
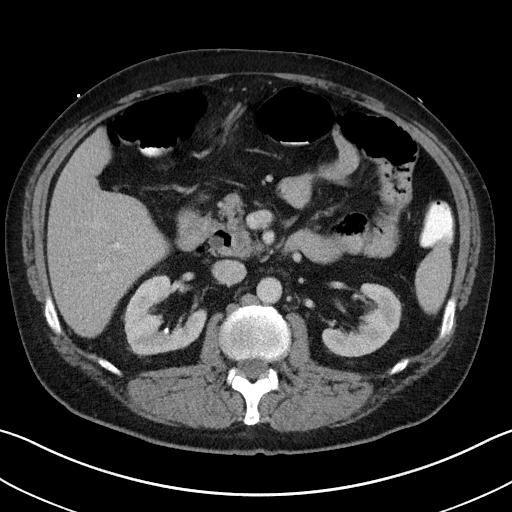
[im 67/134  lung]
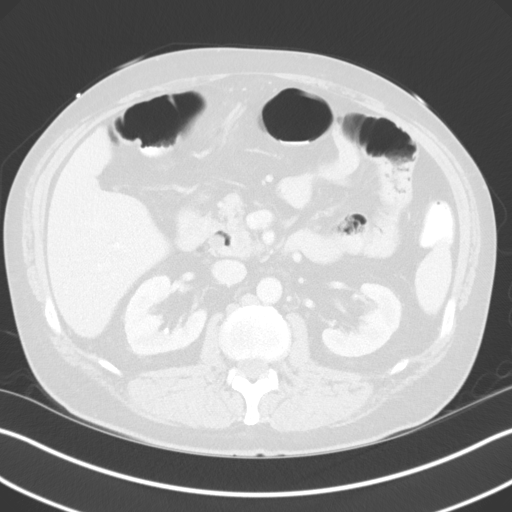
[im 80/134  lung]
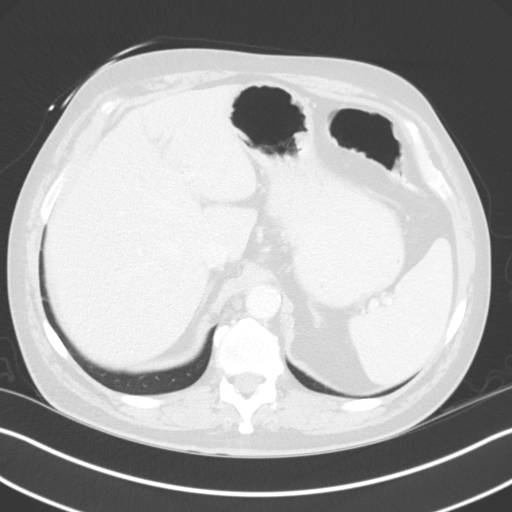
[im 94/134  lung]
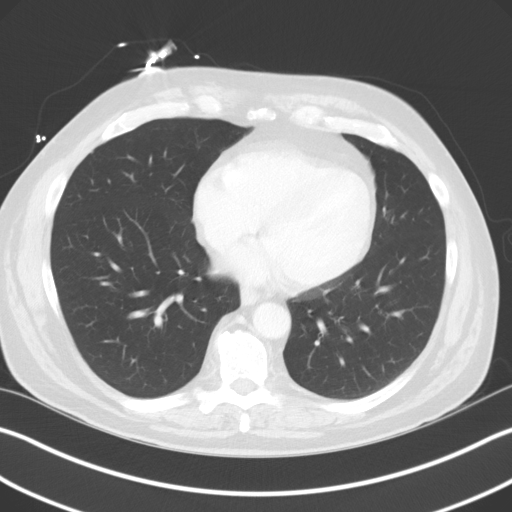
[im 107/134  lung]
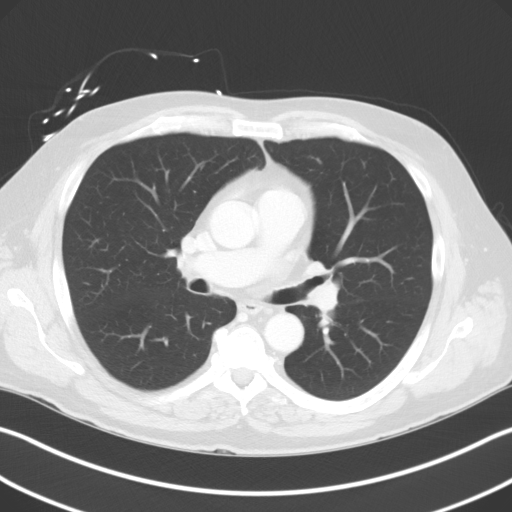
[im 120/134  mediastinal]
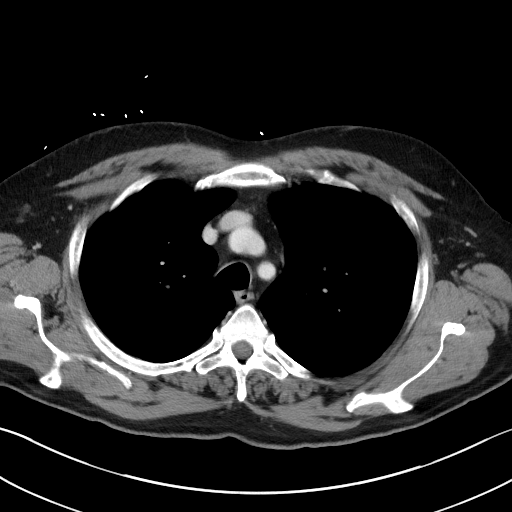
[im 120/134  lung]
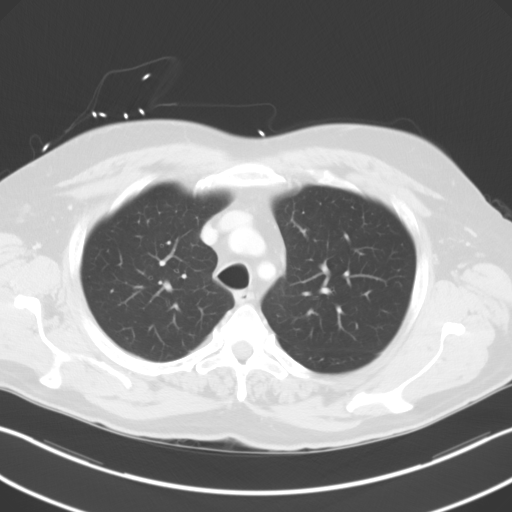

[Series 5: coronals · coronal · 0.75mm/px · 3 of 134 slices shown]
[im 27/134  lung]
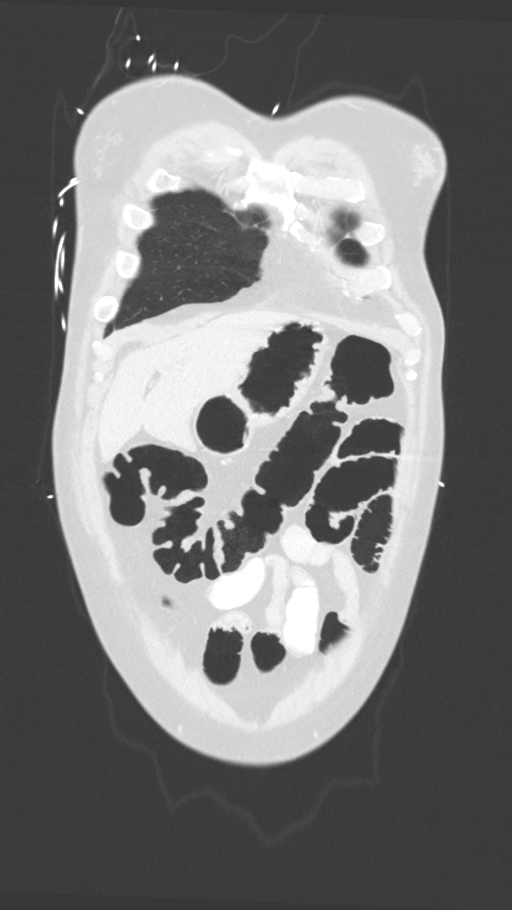
[im 54/134  lung]
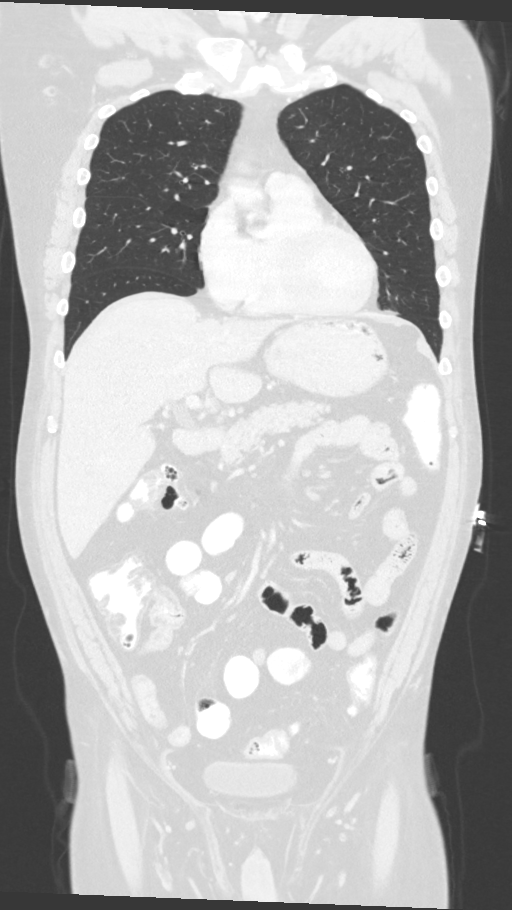
[im 80/134  lung]
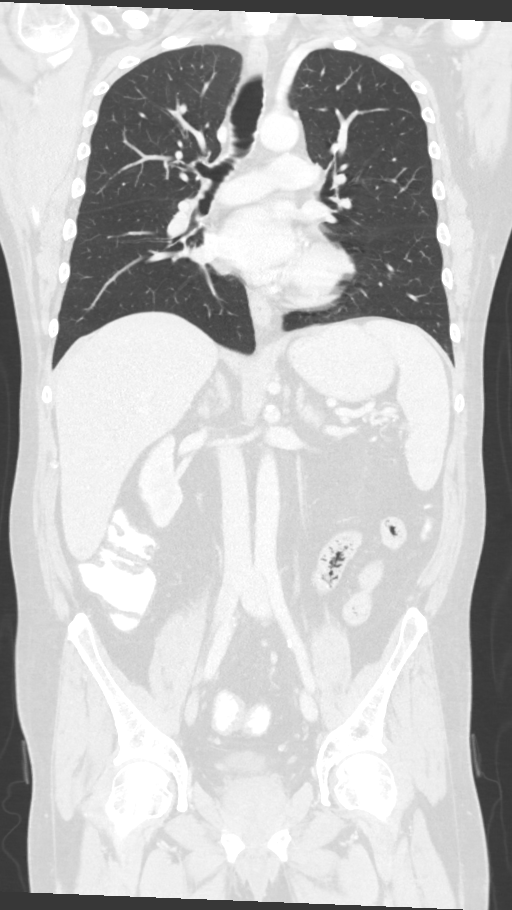

[12 of 36 positions shown; findings below may reference images not displayed]

FINDINGS: CT CHEST FINDINGS

Cardiovascular: There is no cardiomegaly or pericardial effusion.
The thoracic aorta is unremarkable. The central pulmonary arteries
appear patent.

Mediastinum/Nodes: No hilar or mediastinal adenopathy. Esophagus is
grossly unremarkable. No mediastinal fluid collection.

Lungs/Pleura: There is an area of scarring in the lingula similar to
prior CT of 7705. The lungs are otherwise clear. There is no pleural
effusion or pneumothorax. The central airways are patent.

Musculoskeletal: No acute osseous pathology.

CT ABDOMEN PELVIS FINDINGS

No intra-abdominal free air or free fluid.

Hepatobiliary: Faint 14 mm right hepatic hypodense lesion (series 2,
image 71) is not well characterized but appears to be present on the
prior CT of 07/27/2016, suggestive of a benign process. There is no
intrahepatic biliary ductal dilatation. Cholecystectomy.

Pancreas: Unremarkable. No pancreatic ductal dilatation or
surrounding inflammatory changes.

Spleen: Normal in size without focal abnormality.

Adrenals/Urinary Tract: The adrenal glands are unremarkable.
Multiple nonobstructing bilateral renal calculi. The largest stone
or clusters of adjacent stones measure up to 7 x 13 mm in the upper
pole of the left kidney. There is no hydronephrosis on either side.
The visualized ureters and urinary bladder appear unremarkable.

Stomach/Bowel: Multiple duodenal diverticula measure up to 17 mm.
There is sigmoid diverticulosis without active inflammatory changes.
There is no bowel obstruction or active inflammation. Normal
appendix.

Vascular/Lymphatic: The abdominal aorta and IVC appear unremarkable.
No portal venous gas. There is no adenopathy.

Reproductive: Enlarged prostate gland measuring 4.6 cm in transverse
axial diameter. The seminal vesicles are symmetric. No pelvic mass.

Other: Small fat containing umbilical hernia.

Musculoskeletal: No acute or significant osseous findings.
IMPRESSION: 1. No acute intrathoracic, abdominal, or pelvic pathology.
2. Multiple nonobstructing bilateral renal calculi. No
hydronephrosis.
3. Duodenal and sigmoid diverticula. No bowel obstruction or active
inflammation. Normal appendix.

## 2019-06-20 ENCOUNTER — Ambulatory Visit: Payer: Medicaid Other | Admitting: Licensed Clinical Social Worker

## 2019-06-24 ENCOUNTER — Other Ambulatory Visit: Payer: Self-pay

## 2019-06-24 ENCOUNTER — Encounter: Payer: Self-pay | Admitting: Emergency Medicine

## 2019-06-24 DIAGNOSIS — F419 Anxiety disorder, unspecified: Secondary | ICD-10-CM | POA: Insufficient documentation

## 2019-06-24 DIAGNOSIS — I10 Essential (primary) hypertension: Secondary | ICD-10-CM | POA: Insufficient documentation

## 2019-06-24 DIAGNOSIS — Z87891 Personal history of nicotine dependence: Secondary | ICD-10-CM | POA: Insufficient documentation

## 2019-06-24 DIAGNOSIS — Z79899 Other long term (current) drug therapy: Secondary | ICD-10-CM | POA: Insufficient documentation

## 2019-06-24 DIAGNOSIS — J45909 Unspecified asthma, uncomplicated: Secondary | ICD-10-CM | POA: Insufficient documentation

## 2019-06-24 LAB — CBC WITH DIFFERENTIAL/PLATELET
Abs Immature Granulocytes: 0.02 10*3/uL (ref 0.00–0.07)
Basophils Absolute: 0 10*3/uL (ref 0.0–0.1)
Basophils Relative: 1 %
Eosinophils Absolute: 0.2 10*3/uL (ref 0.0–0.5)
Eosinophils Relative: 3 %
HCT: 38.4 % — ABNORMAL LOW (ref 39.0–52.0)
Hemoglobin: 12.1 g/dL — ABNORMAL LOW (ref 13.0–17.0)
Immature Granulocytes: 0 %
Lymphocytes Relative: 30 %
Lymphs Abs: 1.5 10*3/uL (ref 0.7–4.0)
MCH: 27.2 pg (ref 26.0–34.0)
MCHC: 31.5 g/dL (ref 30.0–36.0)
MCV: 86.3 fL (ref 80.0–100.0)
Monocytes Absolute: 0.6 10*3/uL (ref 0.1–1.0)
Monocytes Relative: 13 %
Neutro Abs: 2.6 10*3/uL (ref 1.7–7.7)
Neutrophils Relative %: 53 %
Platelets: 163 10*3/uL (ref 150–400)
RBC: 4.45 MIL/uL (ref 4.22–5.81)
RDW: 17.5 % — ABNORMAL HIGH (ref 11.5–15.5)
WBC: 4.8 10*3/uL (ref 4.0–10.5)
nRBC: 0 % (ref 0.0–0.2)

## 2019-06-24 LAB — COMPREHENSIVE METABOLIC PANEL
ALT: 11 U/L (ref 0–44)
AST: 12 U/L — ABNORMAL LOW (ref 15–41)
Albumin: 3.6 g/dL (ref 3.5–5.0)
Alkaline Phosphatase: 73 U/L (ref 38–126)
Anion gap: 9 (ref 5–15)
BUN: 16 mg/dL (ref 6–20)
CO2: 24 mmol/L (ref 22–32)
Calcium: 8.7 mg/dL — ABNORMAL LOW (ref 8.9–10.3)
Chloride: 104 mmol/L (ref 98–111)
Creatinine, Ser: 0.96 mg/dL (ref 0.61–1.24)
GFR calc Af Amer: >60 mL/min (ref >60)
GFR calc non Af Amer: >60 mL/min (ref >60)
Glucose, Bld: 135 mg/dL — ABNORMAL HIGH (ref 70–99)
Potassium: 3.2 mmol/L — ABNORMAL LOW (ref 3.5–5.1)
Sodium: 137 mmol/L (ref 135–145)
Total Bilirubin: 0.5 mg/dL (ref 0.3–1.2)
Total Protein: 6.1 g/dL — ABNORMAL LOW (ref 6.5–8.1)

## 2019-06-24 LAB — ETHANOL: Alcohol, Ethyl (B): <10 mg/dL (ref <10)

## 2019-06-24 NOTE — ED Notes (Signed)
Pt says when he was here about 10 days ago "they had to give me fluids and Ativan to help relax me"

## 2019-06-24 NOTE — ED Triage Notes (Addendum)
Pt presents with c/o nausea, no vomiting and feeling like he's "shaking inside"; pt reports no alcohol for several days; ambulatory with steady gait; after stating no alcohol for "several days", pt says he actually did have a few "airplane bottles or shots" yesterday;

## 2019-06-25 ENCOUNTER — Emergency Department
Admission: EM | Admit: 2019-06-25 | Discharge: 2019-06-25 | Disposition: A | Discharge status: Home / Self Care | Payer: Self-pay | Attending: Emergency Medicine | Admitting: Emergency Medicine

## 2019-06-25 DIAGNOSIS — F419 Anxiety disorder, unspecified: Secondary | ICD-10-CM

## 2019-06-25 MED ORDER — LORAZEPAM 1 MG PO TABS
1.0000 mg | ORAL_TABLET | Freq: Once | ORAL | Status: AC
  Administered 2019-06-25: 1 mg via ORAL
  Filled 2019-06-25: qty 1

## 2019-06-25 NOTE — ED Notes (Signed)
Pt ambulatory with steady gait to treatment room

## 2019-06-25 NOTE — ED Notes (Signed)
Pt triaged by this RN; pt reports nausea but no vomiting, and feeling shaky today;

## 2019-06-25 NOTE — Discharge Instructions (Signed)
Your vital signs and lab work looked good tonight.  You are doing a good job managing your alcohol dependence.  Please continue with the medication you are taking and with the outpatient counseling that you are receiving.  Please avoid alcohol and any other drugs to the best of your ability and follow-up with your regular doctor or with [facility].  Return to the emergency department if you develop new or worsening symptoms that concern you.

## 2019-06-25 NOTE — ED Notes (Signed)
Dr Forbach in to see pt 

## 2019-06-25 NOTE — ED Provider Notes (Signed)
[facility] Emergency Department Provider Note  ____________________________________________   First MD Initiated Contact with Patient 06/25/19 0125     (approximate)  I have reviewed the triage vital signs and the nursing notes.   HISTORY  Chief Complaint Nausea    HPI Miguel [name] is a 58 y.o. male who is well-known to the emergency department for variety of medical complaints, most notably alcohol dependence and various other psychiatric issues,  who has had 21 visits within the cone of her system in 6 months.  He presents tonight and when asked why he is here he says "I am anxious".  He has been struggling with his alcohol dependence and general thinks he is doing well although he has had a couple of episodes within the last week of drinking some alcohol.  He has not had any today or yesterday.  He has nearly daily phone conversations [facility] which he says is helping.  He feels a lot of stress and anxiety about his situation as well as caring for his wife who has MS.  He knows that he needs to be "on the ball" as far as taking care of his wife and getting her to her appointments and he feels a lot of pressure for that.  He has 1 friend with whom he is in contact but the friend drinks heavily and keeps trying to influence him to drink with him.  He has no suicidal thoughts but is just anxious and wanted to make sure everything was okay.  He has not had any contact with COVID-19 patients.  He denies sore throat, chest pain, cough, shortness of breath, nausea, vomiting, and abdominal pain.        Past Medical History:  Diagnosis Date  . Alcohol abuse   . Asthma   . GERD (gastroesophageal reflux disease)   . Gout   . Hypertension   . Kidney stone   . OCD (obsessive compulsive disorder)   . Renal colic     Patient Active Problem List   Diagnosis Date Noted  . Left-sided chest wall pain 06/14/2019  . Alcoholic cirrhosis of  liver without ascites ([place]) 06/14/2019  . Alcohol abuse 06/14/2019  . AKI (acute kidney injury) ([place]) 05/27/2019  . Alcohol withdrawal ([place]) 05/07/2019  . Alcoholic intoxication with complication ([place])   . Sepsis ([place]) 03/08/2019  . Breast lump or mass 10/04/2018  . Sepsis secondary to UTI ([place]) 09/14/2018  . Asthma 07/07/2018  . GERD (gastroesophageal reflux disease) 07/07/2018  . OCD (obsessive compulsive disorder) 07/07/2018  . Self-inflicted laceration of wrist 03/10/2018  . Leg hematoma 12/25/2017  . Suicide and self-inflicted injury by cutting and piercing instrument ([place]) 03/10/2017  . Severe recurrent major 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 ([place]) 03/09/2017  . Substance induced mood disorder ([place]) 08/15/2016  . Involuntary commitment 08/15/2016  . Alcohol use disorder, severe, dependence ([place]) 02/05/2016  . Hypertension 12/05/2015  . Tachycardia 12/05/2015  . Gout 11/13/2015  . Chronic back pain 05/02/2015    Past Surgical History:  Procedure Laterality Date  . CHOLECYSTECTOMY  2012  . EXTRACORPOREAL SHOCK WAVE LITHOTRIPSY Left 01/12/2019   Procedure: EXTRACORPOREAL SHOCK WAVE LITHOTRIPSY (ESWL);  Surgeon: Billey Co, MD;  Location: [facility] ORS;  Service: Urology;  Laterality: Left;    Prior to Admission medications   Medication Sig Start Date End Date Taking? Authorizing Provider  albuterol (VENTOLIN HFA) 108 (90 Base) MCG/ACT inhaler Inhale 2 puffs into the  lungs every 4 (four) hours as needed. Patient taking differently: Inhale 2 puffs into the lungs every 4 (four) hours as needed for wheezing or shortness of breath.  09/01/18   Tukov-Yual, Arlyss Gandy, NP  allopurinol (ZYLOPRIM) 300 MG tablet Take 1 tablet (300 mg total) by mouth daily. 09/01/18   Tukov-Yual, Arlyss Gandy, NP  colchicine 0.6 MG tablet Take 1 tablet (0.6 mg total) by mouth daily. Patient taking differently: Take 0.6 mg by mouth daily as needed.  03/09/19   Lang Snow, NP  diclofenac  sodium (VOLTAREN) 1 % GEL Apply 2 g topically 4 (four) times daily. 06/14/19   Iloabachie, Chioma E, NP  docusate sodium (COLACE) 100 MG capsule Take 1 tablet once or twice daily as needed for constipation while taking narcotic pain medicine 01/09/19   Hinda Kehr, MD  Fluticasone-Salmeterol (ADVAIR DISKUS) 100-50 MCG/DOSE AEPB Inhale 1 puff into the lungs 2 (two) times daily. 09/01/18   Tukov-Yual, Arlyss Gandy, NP  folic acid (FOLVITE) 1 MG tablet Take 1 tablet (1 mg total) by mouth daily. 09/01/18   Tukov-Yual, Arlyss Gandy, NP  gabapentin (NEURONTIN) 100 MG capsule Take 2 capsules (200 mg total) by mouth 3 (three) times daily. 04/21/19   Patrecia Pour, NP  lisinopril (PRINIVIL,ZESTRIL) 20 MG tablet Take 1 tablet (20 mg total) by mouth daily. 02/21/19   Iloabachie, Chioma E, NP  Multiple Vitamins-Minerals (MULTIVITAMIN WITH MINERALS) tablet Take 1 tablet by mouth daily. 09/01/18   Tukov-Yual, Arlyss Gandy, NP  promethazine (PHENERGAN) 12.5 MG tablet Take 1 tablet (12.5 mg total) by mouth every 6 (six) hours as needed. 05/17/19   Merlyn Lot, MD  tiZANidine (ZANAFLEX) 4 MG tablet Take 0.5 tablets (2 mg total) by mouth every 8 (eight) hours as needed for muscle spasms. 04/26/19   Iloabachie, Chioma E, NP    Allergies Percocet [oxycodone-acetaminophen]  Family History  Problem Relation Age of Onset  . Alcohol abuse Father   . Breast cancer Mother 16    Social History Social History   Tobacco Use  . Smoking status: Former Research scientist (life sciences)  . Smokeless tobacco: Never Used  . Tobacco comment: quit 30 years ago  Substance Use Topics  . Alcohol use: Yes    Alcohol/week: 2.0 standard drinks    Types: 2 Glasses of wine per week    Comment: daily  . Drug use: No    Review of Systems Constitutional: No fever/chills Eyes: No visual changes. ENT: No sore throat. Cardiovascular: Denies chest pain. Respiratory: Denies shortness of breath. Gastrointestinal: No abdominal pain.  No nausea, no vomiting.   No diarrhea.  No constipation. Genitourinary: Negative for dysuria. Musculoskeletal: Negative for neck pain.  Negative for back pain. Integumentary: Negative for rash. Neurological: Negative for headaches, focal weakness or numbness. Psychiatric:  Anxiety, no suicidal ideation  ____________________________________________   PHYSICAL EXAM:  VITAL SIGNS: ED Triage Vitals  Enc Vitals Group     BP 06/24/19 2146 (!) 143/80     Pulse Rate 06/24/19 2146 92     Resp 06/24/19 2146 16     Temp 06/24/19 2146 98.8 F (37.1 C)     Temp Source 06/24/19 2146 Oral     SpO2 06/24/19 2146 99 %     Weight 06/24/19 2147 83.9 kg (185 lb)     Height 06/24/19 2147 1.829 m (6')     Head Circumference --      Peak Flow --      Pain Score 06/24/19 2151 0  Pain Loc --      Pain Edu? --      Excl. in [place]? --     Constitutional: Alert and oriented.  More or less at his baseline, actually looks better than he has multiple times that I have seen him in the past. Eyes: Conjunctivae are normal.  Head: Atraumatic. Nose: No congestion/rhinnorhea. Mouth/Throat: Mucous membranes are moist. Neck: No stridor.  No meningeal signs.   Cardiovascular: Normal rate, regular rhythm. Good peripheral circulation. Grossly normal heart sounds. Respiratory: Normal respiratory effort.  No retractions. Gastrointestinal: Soft and nontender. No distention.  Musculoskeletal: No lower extremity tenderness nor edema. No gross deformities of extremities. Neurologic:  Normal speech and language. No gross focal neurologic deficits are appreciated.  No tremulousness. Skin:  Skin is warm, dry and intact. Psychiatric: Mood and affect are slightly anxious but essentially at his baseline.  No warning signs or symptoms of emergent psychiatric condition.  ____________________________________________   LABS (all labs ordered are listed, but only abnormal results are displayed)  Labs Reviewed  CBC WITH DIFFERENTIAL/PLATELET -  Abnormal; Notable for the following components:      Result Value   Hemoglobin 12.1 (*)    HCT 38.4 (*)    RDW 17.5 (*)    All other components within normal limits  COMPREHENSIVE METABOLIC PANEL - Abnormal; Notable for the following components:   Potassium 3.2 (*)    Glucose, Bld 135 (*)    Calcium 8.7 (*)    Total Protein 6.1 (*)    AST 12 (*)    All other components within normal limits  ETHANOL   ____________________________________________  EKG  None - EKG not ordered by ED physician ____________________________________________  RADIOLOGY Ursula Alert, personally viewed and evaluated these images (plain radiographs) as part of my medical decision making, as well as reviewing the written report by the radiologist.  ED MD interpretation: No indication for imaging  Official radiology report(s): No results found.  ____________________________________________   PROCEDURES   Procedure(s) performed (including Critical Care):  Procedures   ____________________________________________   INITIAL IMPRESSION / MDM / [place] / ED COURSE  As part of my medical decision making, I reviewed the following data within the [facility] notes reviewed and incorporated, Labs reviewed , Old chart reviewed, Notes from prior ED visits and [place] Controlled Substance Database   Differential diagnosis includes, but is not limited to, anxiety, alcohol withdrawal, acute infection, dehydration.  The patient is generally well-appearing and his vital signs are stable.  He has been in the lobby for more than 4 hours and he says that he is feeling better.  He does not meet any criteria for involuntary commitment nor inpatient behavioral medicine treatment.  He has no signs or symptoms of alcohol withdrawal at this time and he has been substantially decrease in the amount of alcohol he drinks according to his own report.  [facility] both [facility] and from a counseling perspective.  I provided reassurance and encouragement that he is doing a good job.  His lab work is essentially normal other than a very slightly decreased potassium.  I encouraged him to keep doing what he is doing and to avoid alcohol and drugs and he says that he understands and agrees.  I do not feel that any urgent pharmacological interventions are indicated at this time.      Clinical Course as of Jun 24 212  Sun Jun 25, 2019  0212 Given the patient's persistent anxiety (and alcohol dependence), I am comfortable giving him Ativan 1 mg PO before he goes home by taxi or [place].  This should help him in the short term, though I will not be writing a prescription for benzos.   [CF]    Clinical Course User Index [CF] Hinda Kehr, MD     ____________________________________________  FINAL CLINICAL IMPRESSION(S) / ED DIAGNOSES  Final diagnoses:  Anxiety     MEDICATIONS GIVEN DURING THIS VISIT:  Medications  LORazepam (ATIVAN) tablet 1 mg (has no administration in time range)     ED Discharge Orders    None      *Please note:  Miguel [name] was evaluated in Emergency Department on 06/25/2019 for the symptoms described in the history of present illness. He was evaluated in the context of the global COVID-19 pandemic, which necessitated consideration that the patient might be at risk for infection with the SARS-CoV-2 virus that causes COVID-19. Institutional protocols and algorithms that pertain to the evaluation of patients at risk for COVID-19 are in a state of rapid change based on information released by regulatory bodies including the CDC and federal and state organizations. These policies and algorithms were followed during the patient's care in the ED.  Some ED evaluations and interventions may be delayed as a result of limited staffing during the pandemic.*  Note:  This document was prepared using Dragon voice recognition software and  may include unintentional dictation errors.   Hinda Kehr, MD 06/25/19 [phone]

## 2019-07-01 IMAGING — CR DG CHEST 2V
1 series · 2 of 2 positions shown · non-contrast
Comparison: Chest radiograph September 14, 2018 and chest CT September 14, 2018

CLINICAL DATA: Chest pain

EXAM:
CHEST - 2 VIEW

[Series 1: dg chest 2 view · 0.14mm/px · 2 of 2 slices shown]
[im 1/2]
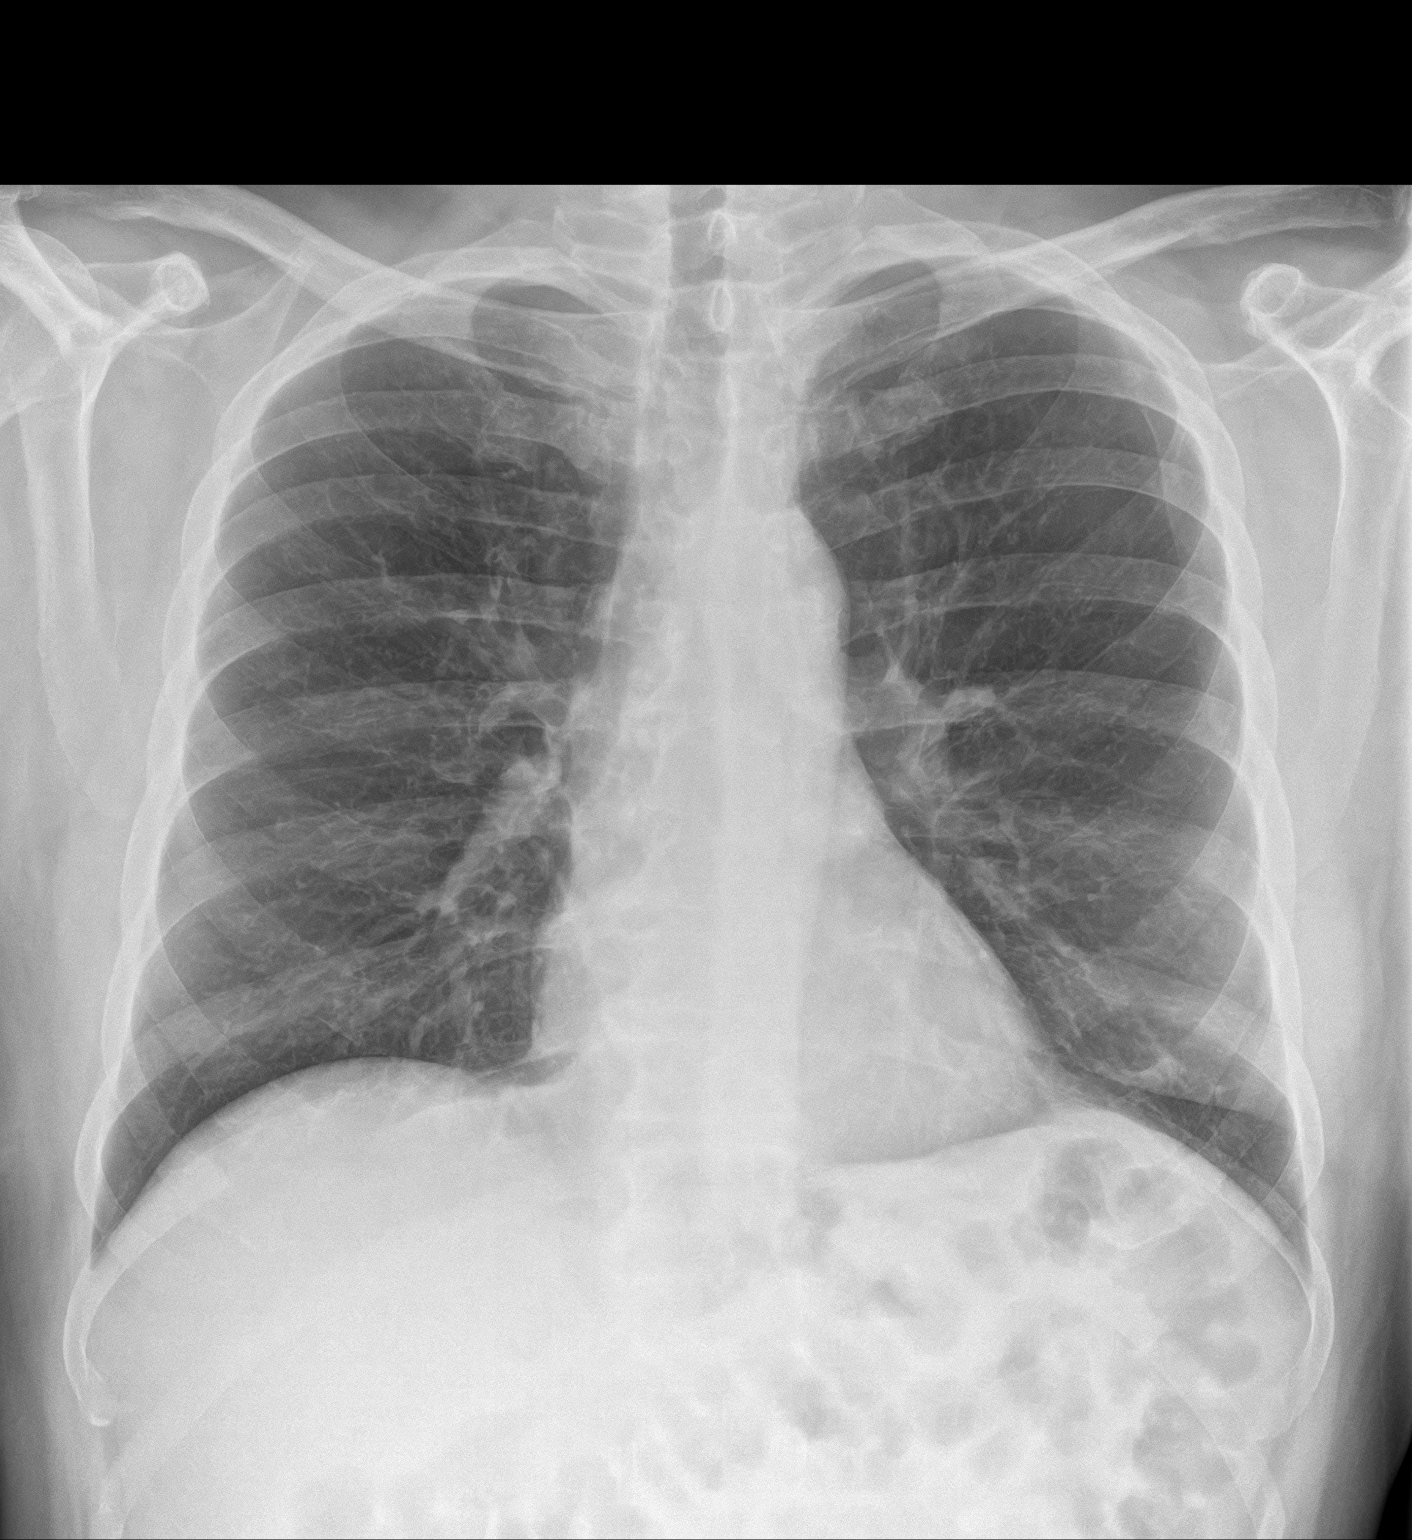
[im 2/2]
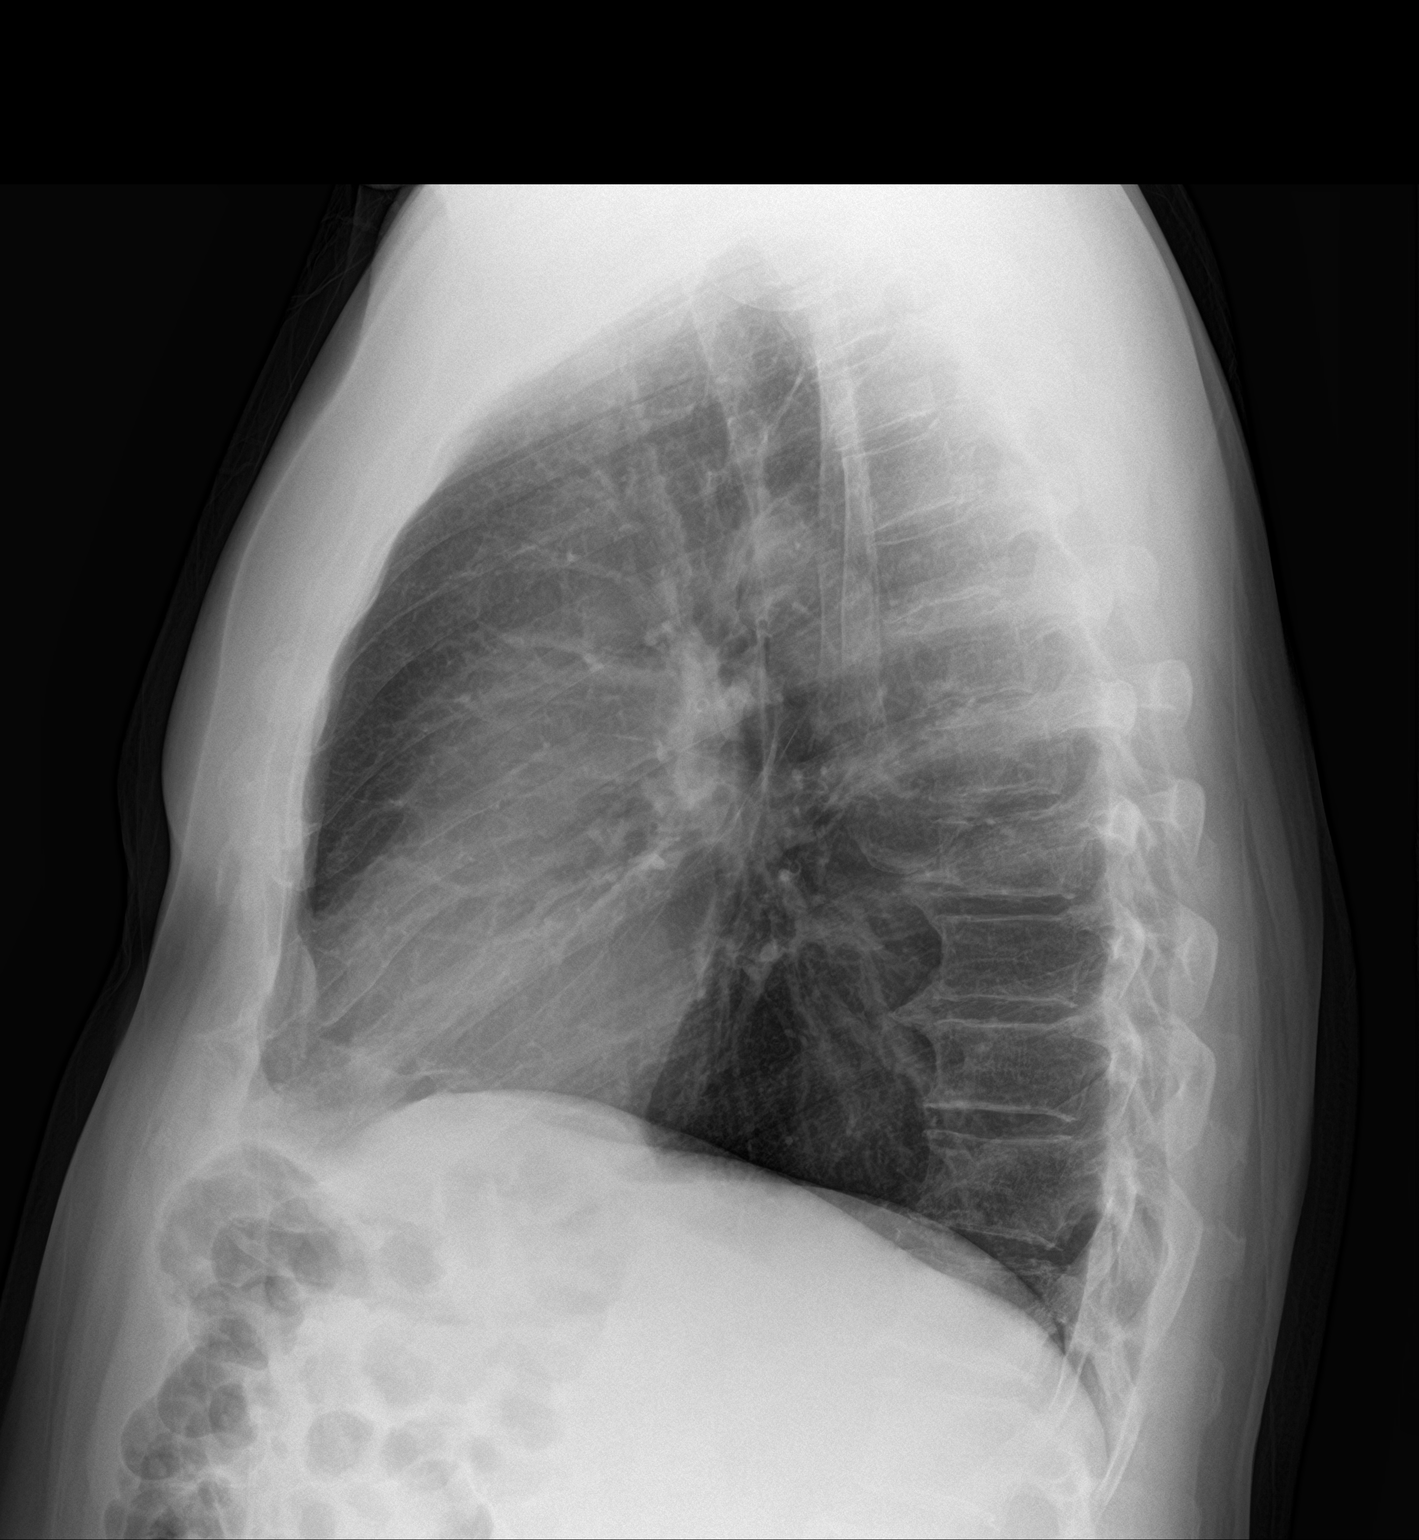

[2 of 2 positions shown; findings below may reference images not displayed]

FINDINGS: There is no evident edema or consolidation. The heart size and
pulmonary vascularity are normal. No adenopathy. No pneumothorax.
There is mild degenerative change in the lower thoracic region.
IMPRESSION: No edema or consolidation.

## 2019-07-04 ENCOUNTER — Ambulatory Visit: Payer: Medicaid Other | Admitting: Gerontology

## 2019-07-04 IMAGING — MG DIGITAL DIAGNOSTIC BILATERAL MAMMOGRAM WITH TOMO AND CAD
8 series · 8 of 24 positions shown · non-contrast
Comparison: Previous exam(s).

ACR Breast Density Category a: The breast tissue is almost entirely
fatty.

CLINICAL DATA: Left subareolar breast area of palpable concern and
tenderness.

EXAM:
DIGITAL DIAGNOSTIC BILATERAL MAMMOGRAM WITH CAD AND TOMO
ULTRASOUND LEFT BREAST

[R CC synth-2D]
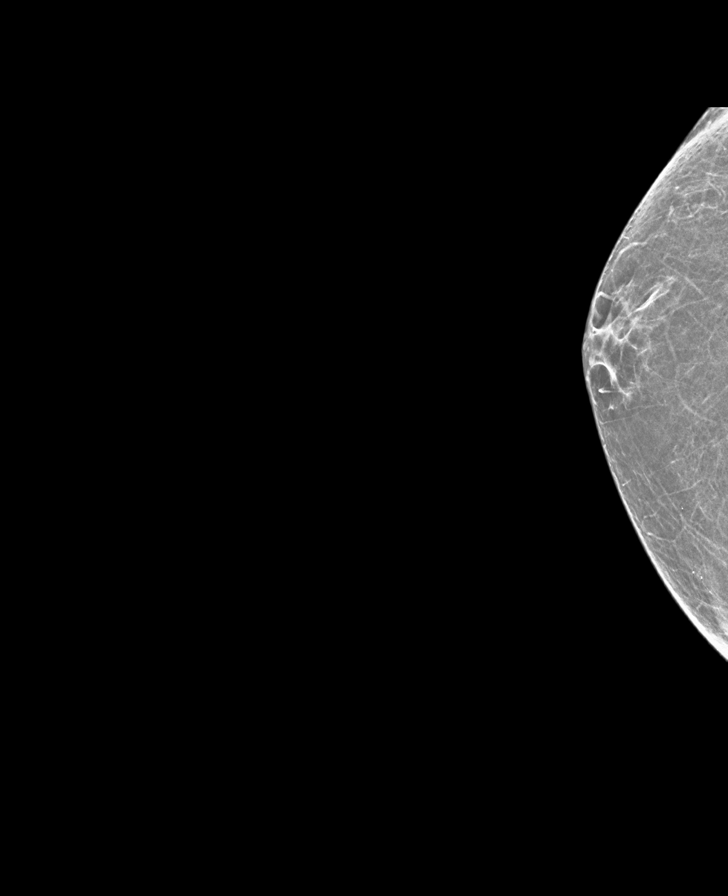

[R MLO synth-2D]
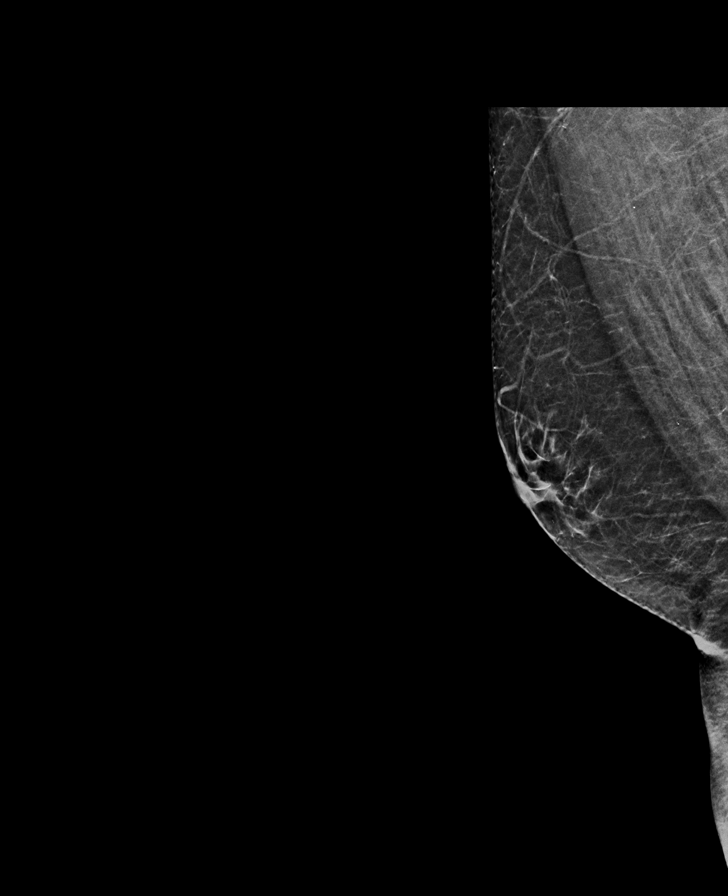

[L CC synth-2D]
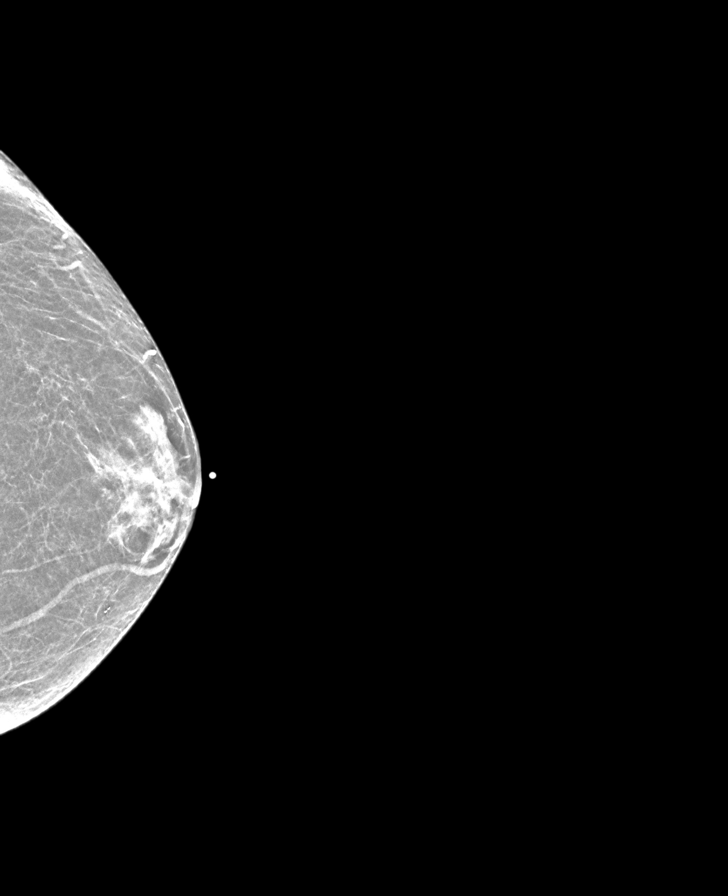

[L TAN synth-2D]
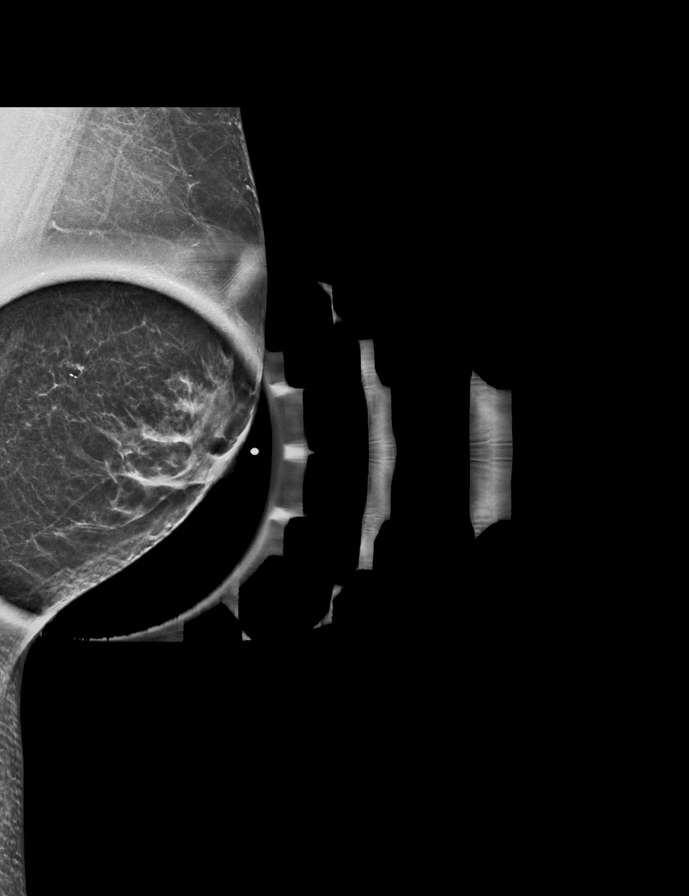

[L CC tomo · tomo slice 24/47.0]
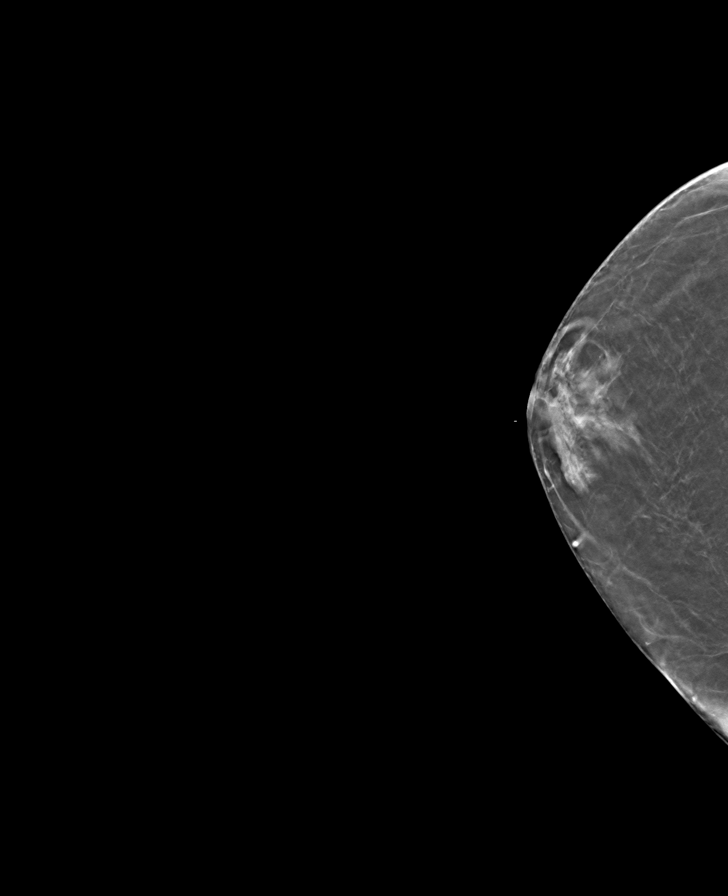

[R MLO tomo · tomo slice 25/48.0]
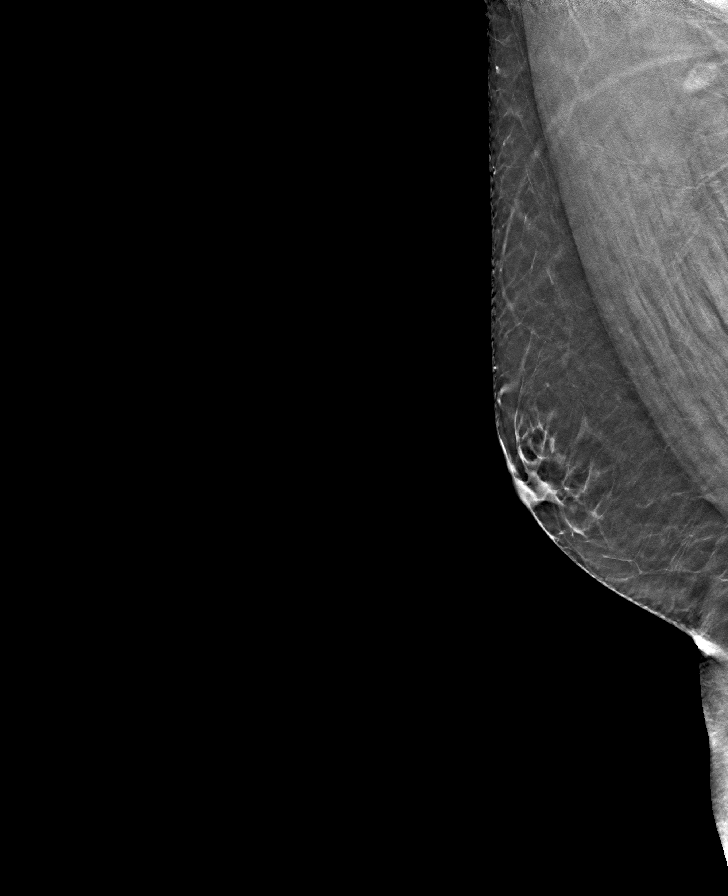

[L TAN tomo · tomo slice 23/45.0]
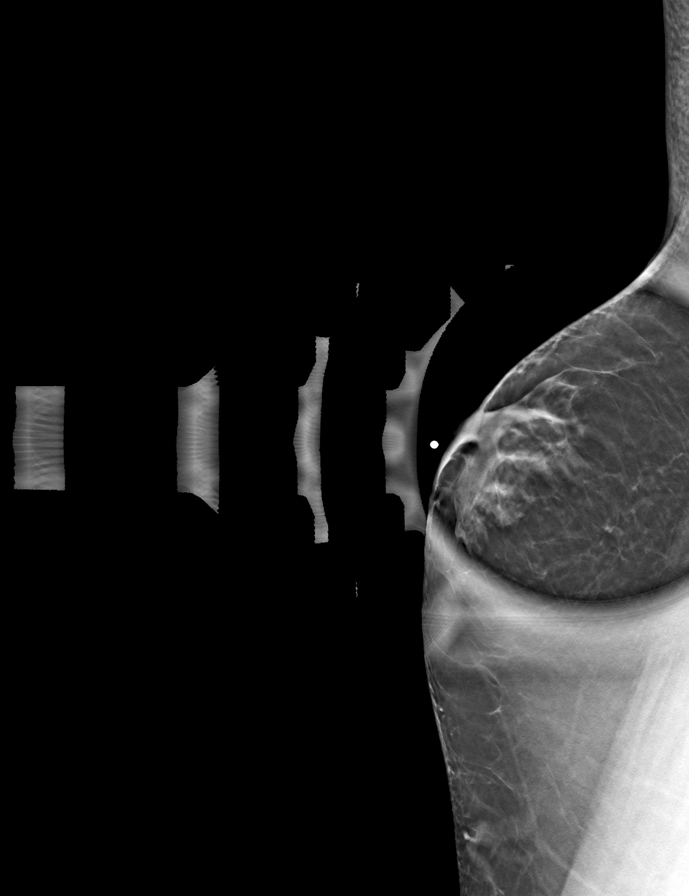

[L MLO tomo · tomo slice 27/52.0]
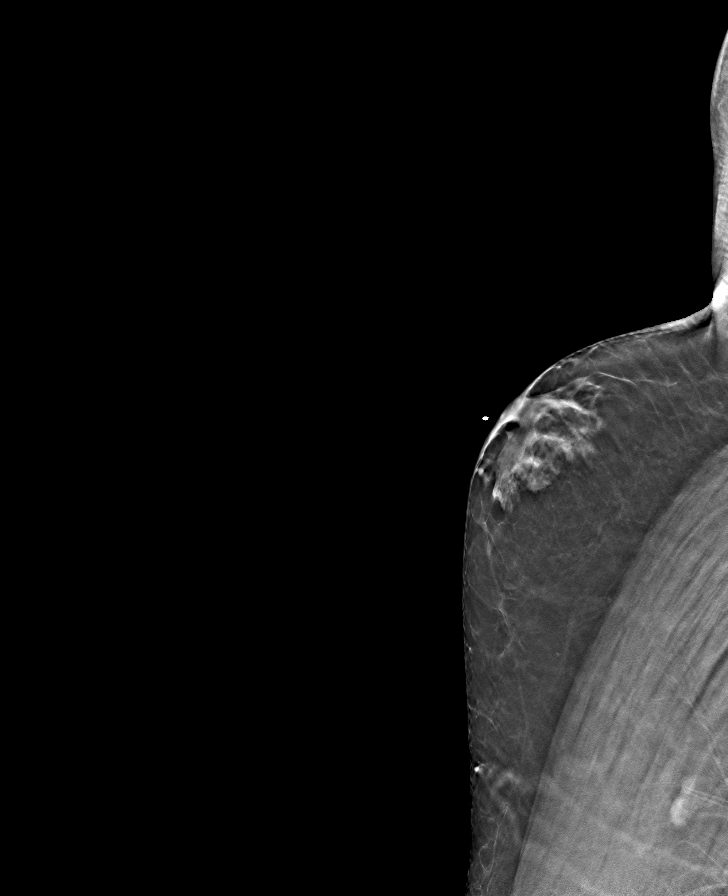

[8 of 24 positions shown; findings below may reference images not displayed]

FINDINGS: Mammographically, there are no suspicious masses, areas of
architectural distortion or microcalcifications in either breast.
There is bilateral gynecomastia, more prominent in the left
subareolar breast.

Mammographic images were processed with CAD.

On physical exam, no suspicious masses are palpated. Mild
gynecomastia, left greater than right.

Targeted ultrasound is performed, showing subareolar dendritic
gynecomastia, which corresponds to the area of palpable concern.
IMPRESSION: No mammographic or sonographic evidence of malignancy in either
breast.

The area of palpable concern in the left subareolar breast
corresponds to an area of gynecomastia.

RECOMMENDATION:
Clinical follow-up for bilateral, more prominent on the left,
gynecomastia.

I have discussed the findings and recommendations with the patient.
Results were also provided in writing at the conclusion of the
visit. If applicable, a reminder letter will be sent to the patient
regarding the next appointment.

BI-RADS CATEGORY  2: Benign.

## 2019-07-07 IMAGING — CR DG PELVIS 1-2V
1 series · 1 of 1 positions shown · non-contrast
Comparison: None.

CLINICAL DATA: Patient reports that he lost his balance and fell
earlier this morning. Denies hitting head or LOC. Patient reports
pain to left hip. Patient ambulating around room with NAD noted.
Patient also complaining of shortness of breath....*comment was
truncated*sob, fall

EXAM:
PELVIS - 1-2 VIEW

[dg pelvis 1-2 views]
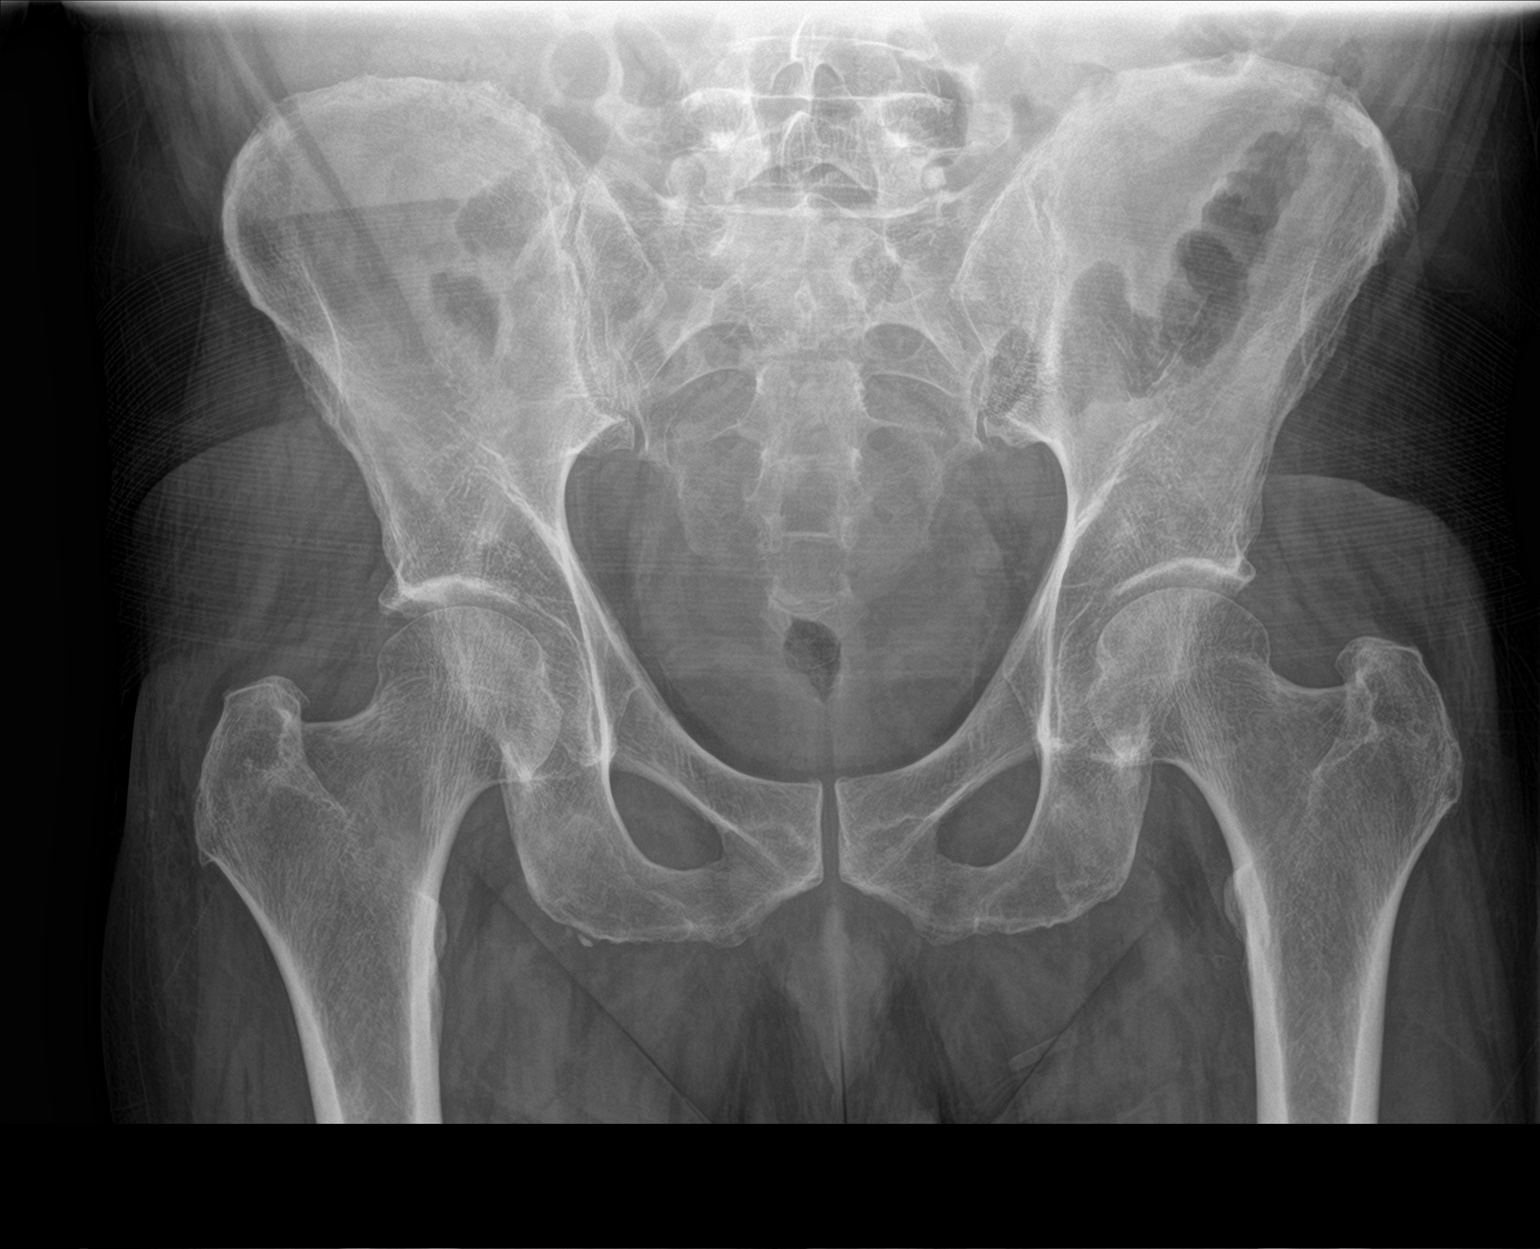

[1 of 1 positions shown; findings below may reference images not displayed]

FINDINGS: There is no evidence of pelvic fracture or diastasis. No pelvic bone
lesions are seen.
IMPRESSION: No fracture or dislocation.

## 2019-07-11 ENCOUNTER — Ambulatory Visit: Payer: Medicaid Other | Admitting: Gerontology

## 2019-07-12 ENCOUNTER — Ambulatory Visit: Payer: Medicaid Other | Admitting: Gerontology

## 2019-07-12 ENCOUNTER — Encounter: Payer: Self-pay | Admitting: Emergency Medicine

## 2019-07-12 ENCOUNTER — Other Ambulatory Visit: Payer: Self-pay

## 2019-07-12 ENCOUNTER — Emergency Department: Payer: Medicaid Other

## 2019-07-12 ENCOUNTER — Emergency Department
Admission: EM | Admit: 2019-07-12 | Discharge: 2019-07-12 | Disposition: A | Discharge status: Home / Self Care | Payer: Medicaid Other | Attending: Emergency Medicine | Admitting: Emergency Medicine

## 2019-07-12 DIAGNOSIS — M25512 Pain in left shoulder: Secondary | ICD-10-CM | POA: Insufficient documentation

## 2019-07-12 DIAGNOSIS — Y9289 Other specified places as the place of occurrence of the external cause: Secondary | ICD-10-CM | POA: Insufficient documentation

## 2019-07-12 DIAGNOSIS — W19XXXA Unspecified fall, initial encounter: Secondary | ICD-10-CM

## 2019-07-12 DIAGNOSIS — Y998 Other external cause status: Secondary | ICD-10-CM | POA: Insufficient documentation

## 2019-07-12 DIAGNOSIS — F419 Anxiety disorder, unspecified: Secondary | ICD-10-CM | POA: Insufficient documentation

## 2019-07-12 DIAGNOSIS — J45909 Unspecified asthma, uncomplicated: Secondary | ICD-10-CM | POA: Insufficient documentation

## 2019-07-12 DIAGNOSIS — M542 Cervicalgia: Secondary | ICD-10-CM | POA: Insufficient documentation

## 2019-07-12 DIAGNOSIS — W01198A Fall on same level from slipping, tripping and stumbling with subsequent striking against other object, initial encounter: Secondary | ICD-10-CM | POA: Insufficient documentation

## 2019-07-12 DIAGNOSIS — Y9301 Activity, walking, marching and hiking: Secondary | ICD-10-CM | POA: Insufficient documentation

## 2019-07-12 DIAGNOSIS — I10 Essential (primary) hypertension: Secondary | ICD-10-CM | POA: Insufficient documentation

## 2019-07-12 DIAGNOSIS — R0602 Shortness of breath: Secondary | ICD-10-CM | POA: Insufficient documentation

## 2019-07-12 DIAGNOSIS — Z87891 Personal history of nicotine dependence: Secondary | ICD-10-CM | POA: Insufficient documentation

## 2019-07-12 DIAGNOSIS — M25511 Pain in right shoulder: Secondary | ICD-10-CM

## 2019-07-12 DIAGNOSIS — Z79899 Other long term (current) drug therapy: Secondary | ICD-10-CM | POA: Insufficient documentation

## 2019-07-12 MED ORDER — ONDANSETRON 4 MG PO TBDP
4.0000 mg | ORAL_TABLET | Freq: Once | ORAL | Status: AC
  Administered 2019-07-12: 21:00:00 4 mg via ORAL
  Filled 2019-07-12: qty 1

## 2019-07-12 MED ORDER — METHOCARBAMOL 1000 MG/10ML IJ SOLN
1000.0000 mg | Freq: Once | INTRAMUSCULAR | Status: DC
  Filled 2019-07-12: qty 10

## 2019-07-12 MED ORDER — LORAZEPAM 1 MG PO TABS
1.0000 mg | ORAL_TABLET | Freq: Once | ORAL | Status: AC
  Administered 2019-07-12: 20:00:00 1 mg via ORAL
  Filled 2019-07-12: qty 1

## 2019-07-12 MED ORDER — METHOCARBAMOL 500 MG PO TABS
500.0000 mg | ORAL_TABLET | Freq: Once | ORAL | Status: AC
  Administered 2019-07-12: 500 mg via ORAL
  Filled 2019-07-12: qty 1

## 2019-07-12 MED ORDER — LORAZEPAM 1 MG PO TABS
1.0000 mg | ORAL_TABLET | Freq: Once | ORAL | Status: AC
  Administered 2019-07-12: 1 mg via ORAL
  Filled 2019-07-12: qty 1

## 2019-07-12 MED ORDER — METHOCARBAMOL 500 MG PO TABS: 500.0000 mg | ORAL_TABLET | Freq: Four times a day (QID) | ORAL | 0 refills | Status: DC

## 2019-07-12 NOTE — Discharge Instructions (Addendum)
Please take methocarbamol as needed for neck pain, muscle spasms.  Follow-up with PCP in 1 week if no improvement.  Return to the ER for any worsening symptoms or urgent changes in health.

## 2019-07-12 NOTE — ED Notes (Signed)
Pt slipped and fell on his ramp at his home.  Pt has upper back and neck pain.  No loc.  Pt has nausea and vomited x 1.  Pt alert   Speech clear.

## 2019-07-12 NOTE — ED Notes (Signed)
Pt did not want xrays done at this time.  Pa-c aware.  Pt states he feels nauseous.

## 2019-07-12 NOTE — ED Notes (Signed)
meds given for nausea.

## 2019-07-12 NOTE — ED Notes (Signed)
ED Provider at bedside. 

## 2019-07-12 NOTE — ED Provider Notes (Signed)
[facility] EMERGENCY DEPARTMENT Provider Note   CSN: [id number] Arrival date & time: 07/12/19  1940     History   Chief Complaint Chief Complaint  Patient presents with   Back Pain   Shoulder Pain    HPI Miguel [name] is a 58 y.o. male.  Presents the emergency department for evaluation of bilateral shoulder pain, neck pain after slipping on a ramp outside and falling backwards hitting his shoulder blades and neck along the railing.  He denies hitting his head or losing consciousness.  He has no headache, nausea or vomiting.  He suffers from alcoholism, states his last drink was 9 AM this morning, had two shots of liquor.  States he was having some shortness of breath earlier but this was his anxiety.  After 1 mg of Ativan this resolved.  He is currently without any chest pain shortness of breath or cough.       HPI  Past Medical History:  Diagnosis Date   Alcohol abuse    Asthma    GERD (gastroesophageal reflux disease)    Gout    Hypertension    Kidney stone    OCD (obsessive compulsive disorder)    Renal colic     Patient Active Problem List   Diagnosis Date Noted   Left-sided chest wall pain [id number]   Alcoholic cirrhosis of liver without ascites ([place]) 06/14/2019   Alcohol abuse 06/14/2019   AKI (acute kidney injury) ([place]) 05/27/2019   Alcohol withdrawal ([place]) [id number]   Alcoholic intoxication with complication ([place])    Sepsis ([place]) 03/08/2019   Breast lump or mass 10/04/2018   Sepsis secondary to UTI ([place]) 09/14/2018   Asthma 07/07/2018   GERD (gastroesophageal reflux disease) 07/07/2018   OCD (obsessive compulsive disorder) [id number]   Self-inflicted laceration of wrist 03/10/2018   Leg hematoma 12/25/2017   Suicide and self-inflicted injury by cutting and piercing instrument ([place]) 03/10/2017   Severe recurrent major 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 ([place]) 03/09/2017   Substance induced mood disorder  ([place]) 08/15/2016   Involuntary commitment 08/15/2016   Alcohol use disorder, severe, dependence ([place]) 02/05/2016   Hypertension 12/05/2015   Tachycardia 12/05/2015   Gout 11/13/2015   Chronic back pain 05/02/2015    Past Surgical History:  Procedure Laterality Date   CHOLECYSTECTOMY  2012   EXTRACORPOREAL SHOCK WAVE LITHOTRIPSY Left 01/12/2019   Procedure: EXTRACORPOREAL SHOCK WAVE LITHOTRIPSY (ESWL);  Surgeon: Billey Co, MD;  Location: [facility] ORS;  Service: Urology;  Laterality: Left;        Home Medications    Prior to Admission medications   Medication Sig Start Date End Date Taking? Authorizing Provider  albuterol (VENTOLIN HFA) 108 (90 Base) MCG/ACT inhaler Inhale 2 puffs into the lungs every 4 (four) hours as needed. Patient taking differently: Inhale 2 puffs into the lungs every 4 (four) hours as needed for wheezing or shortness of breath.  09/01/18   Tukov-Yual, Arlyss Gandy, NP  allopurinol (ZYLOPRIM) 300 MG tablet Take 1 tablet (300 mg total) by mouth daily. 09/01/18   Tukov-Yual, Arlyss Gandy, NP  colchicine 0.6 MG tablet Take 1 tablet (0.6 mg total) by mouth daily. Patient taking differently: Take 0.6 mg by mouth daily as needed.  03/09/19   Lang Snow, NP  diclofenac sodium (VOLTAREN) 1 % GEL Apply 2 g topically 4 (four) times daily. 06/14/19   Iloabachie, Chioma E, NP  docusate sodium (COLACE) 100 MG capsule Take 1 tablet once or twice daily as needed  for constipation while taking narcotic pain medicine 01/09/19   Hinda Kehr, MD  Fluticasone-Salmeterol (ADVAIR DISKUS) 100-50 MCG/DOSE AEPB Inhale 1 puff into the lungs 2 (two) times daily. 09/01/18   Tukov-Yual, Arlyss Gandy, NP  folic acid (FOLVITE) 1 MG tablet Take 1 tablet (1 mg total) by mouth daily. 09/01/18   Tukov-Yual, Arlyss Gandy, NP  gabapentin (NEURONTIN) 100 MG capsule Take 2 capsules (200 mg total) by mouth 3 (three) times daily. 04/21/19   Patrecia Pour, NP  lisinopril  (PRINIVIL,ZESTRIL) 20 MG tablet Take 1 tablet (20 mg total) by mouth daily. 02/21/19   Iloabachie, Chioma E, NP  methocarbamol (ROBAXIN) 500 MG tablet Take 1 tablet (500 mg total) by mouth 4 (four) times daily. 07/12/19   Duanne Guess, PA-C  Multiple Vitamins-Minerals (MULTIVITAMIN WITH MINERALS) tablet Take 1 tablet by mouth daily. 09/01/18   Tukov-Yual, Arlyss Gandy, NP  promethazine (PHENERGAN) 12.5 MG tablet Take 1 tablet (12.5 mg total) by mouth every 6 (six) hours as needed. 05/17/19   Merlyn Lot, MD  tiZANidine (ZANAFLEX) 4 MG tablet Take 0.5 tablets (2 mg total) by mouth every 8 (eight) hours as needed for muscle spasms. 04/26/19   Iloabachie, Chioma E, NP    Family History Family History  Problem Relation Age of Onset   Alcohol abuse Father    Breast cancer Mother 9    Social History Social History   Tobacco Use   Smoking status: Former Smoker   Smokeless tobacco: Never Used   Tobacco comment: quit 30 years ago  Substance Use Topics   Alcohol use: Yes    Alcohol/week: 2.0 standard drinks    Types: 2 Glasses of wine per week    Comment: daily   Drug use: No     Allergies   Percocet [oxycodone-acetaminophen]   Review of Systems Review of Systems  Constitutional: Negative for chills and fever.  HENT: Negative for trouble swallowing.   Respiratory: Positive for cough and shortness of breath. Negative for choking, wheezing and stridor.   Cardiovascular: Negative for chest pain.  Musculoskeletal: Positive for myalgias and neck pain. Negative for arthralgias, back pain, gait problem, joint swelling and neck stiffness.  Skin: Negative for color change and rash.  Neurological: Negative for dizziness, syncope, light-headedness, numbness and headaches.  Psychiatric/Behavioral:       Patient states he is anxious.     Physical Exam Updated Vital Signs BP 135/89 (BP Location: Left Arm)    Pulse (!) 104    Temp 98.4 F (36.9 C) (Oral)    Resp 18    Ht 6'  (1.829 m)    Wt 83.9 kg    SpO2 97%    BMI 25.09 kg/m   Physical Exam Constitutional:      Appearance: He is well-developed.  HENT:     Head: Normocephalic and atraumatic.     Right Ear: External ear normal. There is no impacted cerumen.     Left Ear: External ear normal.     Nose: No congestion or rhinorrhea.  Eyes:     Extraocular Movements: Extraocular movements intact.     Conjunctiva/sclera: Conjunctivae normal.     Pupils: Pupils are equal, round, and reactive to light.  Neck:     Musculoskeletal: Normal range of motion.  Cardiovascular:     Rate and Rhythm: Normal rate.  Pulmonary:     Effort: Pulmonary effort is normal. No respiratory distress.     Breath sounds: Normal breath sounds. No stridor. No  wheezing, rhonchi or rales.  Chest:     Chest wall: No tenderness.  Abdominal:     General: There is no distension.     Tenderness: There is no abdominal tenderness.  Musculoskeletal: Normal range of motion.     Comments: Normal range of motion of shoulders and cervical spine.  Patient non tender along the cervical spinous process with left and right paravertebral muscles of the cervical spine.  Patient with tenderness to both shoulders along the anterior and posterior joint.  Full active range of motion of shoulders.  No clavicle tenderness.  Nontender around the elbows.  No thoracic or lumbar spinous process tenderness.  Neurovascular intact in bilateral upper extremities.  Skin:    General: Skin is warm.     Findings: No rash.  Neurological:     General: No focal deficit present.     Mental Status: He is alert and oriented to person, place, and time.  Psychiatric:        Mood and Affect: Mood is anxious.      ED Treatments / Results  Labs (all labs ordered are listed, but only abnormal results are displayed) Labs Reviewed - No data to display  EKG None  Radiology No results found.  Procedures Procedures (including critical care time)  Medications Ordered  in ED Medications  LORazepam (ATIVAN) tablet 1 mg (1 mg Oral Given 07/12/19 2022)  ondansetron (ZOFRAN-ODT) disintegrating tablet 4 mg (4 mg Oral Given 07/12/19 2034)  methocarbamol (ROBAXIN) tablet 500 mg (500 mg Oral Given 07/12/19 2201)  LORazepam (ATIVAN) tablet 1 mg (1 mg Oral Given 07/12/19 2232)     Initial Impression / Assessment and Plan / ED Course  I have reviewed the triage vital signs and the nursing notes.  Pertinent labs & imaging results that were available during my care of the patient were reviewed by me and considered in my medical decision making (see chart for details).        58 year old male with history of alcoholism, anxiety presents with a fall earlier today, no head injury, LOC, nausea or vomiting.  Patient initially with a lot of anxiety, was given Ativan.  Patient set up for x-rays of neck and shoulders but later refused.  Patient states he does not feel as if he needs x-rays at this time.  Also complaining of some cough and shortness of breath but this completely resolved with Ativan.  His lungs are normal with no wheezing rales or rhonchi.  Good breath sounds.  After Ativan patient able to move around throughout the room and ambulates to the bathroom with no antalgic gait.  He appears well.  Patient stable and ready for discharge to home.  I will give him prescription for methocarbamol for muscle relaxation as he does report some muscle tightness along the paravertebral muscles of cervical spine.  No neurological deficits.  Patient stable and ready for discharge.  Final Clinical Impressions(s) / ED Diagnoses   Final diagnoses:  Fall, initial encounter  Neck pain  Acute pain of both shoulders  Anxiety    ED Discharge Orders         Ordered    methocarbamol (ROBAXIN) 500 MG tablet  4 times daily     07/12/19 2303           Renata Caprice 07/12/19 2305    Lavonia Drafts, MD 07/12/19 2322

## 2019-07-12 NOTE — ED Triage Notes (Signed)
Pt presents to ED with bilateral shoulder and upper back pain after slipping and falling while walking down the ramp outside his home. Pt reports the side of his neck hurt as well. Denies hitting his head or loc.

## 2019-07-15 ENCOUNTER — Encounter: Payer: Self-pay | Admitting: Emergency Medicine

## 2019-07-15 ENCOUNTER — Emergency Department
Admission: EM | Admit: 2019-07-15 | Discharge: 2019-07-16 | Disposition: A | Discharge status: Home / Self Care | Payer: Self-pay | Attending: Emergency Medicine | Admitting: Emergency Medicine

## 2019-07-15 ENCOUNTER — Other Ambulatory Visit: Payer: Self-pay

## 2019-07-15 ENCOUNTER — Emergency Department
Admission: EM | Admit: 2019-07-15 | Discharge: 2019-07-15 | Disposition: A | Discharge status: Home / Self Care | Payer: Medicaid Other | Attending: Emergency Medicine | Admitting: Emergency Medicine

## 2019-07-15 DIAGNOSIS — Z885 Allergy status to narcotic agent status: Secondary | ICD-10-CM | POA: Insufficient documentation

## 2019-07-15 DIAGNOSIS — Z87891 Personal history of nicotine dependence: Secondary | ICD-10-CM | POA: Insufficient documentation

## 2019-07-15 DIAGNOSIS — F10220 Alcohol dependence with intoxication, uncomplicated: Secondary | ICD-10-CM | POA: Insufficient documentation

## 2019-07-15 DIAGNOSIS — F1010 Alcohol abuse, uncomplicated: Secondary | ICD-10-CM

## 2019-07-15 DIAGNOSIS — I10 Essential (primary) hypertension: Secondary | ICD-10-CM | POA: Insufficient documentation

## 2019-07-15 DIAGNOSIS — Y906 Blood alcohol level of 120-199 mg/100 ml: Secondary | ICD-10-CM | POA: Insufficient documentation

## 2019-07-15 DIAGNOSIS — Z9189 Other specified personal risk factors, not elsewhere classified: Secondary | ICD-10-CM | POA: Insufficient documentation

## 2019-07-15 DIAGNOSIS — Y908 Blood alcohol level of 240 mg/100 ml or more: Secondary | ICD-10-CM | POA: Insufficient documentation

## 2019-07-15 DIAGNOSIS — Z23 Encounter for immunization: Secondary | ICD-10-CM | POA: Insufficient documentation

## 2019-07-15 DIAGNOSIS — Z79899 Other long term (current) drug therapy: Secondary | ICD-10-CM | POA: Insufficient documentation

## 2019-07-15 DIAGNOSIS — J45909 Unspecified asthma, uncomplicated: Secondary | ICD-10-CM | POA: Insufficient documentation

## 2019-07-15 LAB — CBC WITH DIFFERENTIAL/PLATELET
Abs Immature Granulocytes: 0 10*3/uL (ref 0.00–0.07)
Basophils Absolute: 0 10*3/uL (ref 0.0–0.1)
Basophils Relative: 0 %
Eosinophils Absolute: 0 10*3/uL (ref 0.0–0.5)
Eosinophils Relative: 0 %
HCT: 42.2 % (ref 39.0–52.0)
Hemoglobin: 14.2 g/dL (ref 13.0–17.0)
Immature Granulocytes: 0 %
Lymphocytes Relative: 27 %
Lymphs Abs: 2 10*3/uL (ref 0.7–4.0)
MCH: 28 pg (ref 26.0–34.0)
MCHC: 33.6 g/dL (ref 30.0–36.0)
MCV: 83.1 fL (ref 80.0–100.0)
Monocytes Absolute: 0.8 10*3/uL (ref 0.1–1.0)
Monocytes Relative: 10 %
Neutro Abs: 4.6 10*3/uL (ref 1.7–7.7)
Neutrophils Relative %: 63 %
Platelets: 227 10*3/uL (ref 150–400)
RBC: 5.08 MIL/uL (ref 4.22–5.81)
RDW: 16.7 % — ABNORMAL HIGH (ref 11.5–15.5)
WBC: 7.3 10*3/uL (ref 4.0–10.5)
nRBC: 0 % (ref 0.0–0.2)

## 2019-07-15 LAB — LIPASE, BLOOD: Lipase: 29 U/L (ref 11–51)

## 2019-07-15 LAB — URINALYSIS, COMPLETE (UACMP) WITH MICROSCOPIC
Bacteria, UA: NONE SEEN
Bilirubin Urine: NEGATIVE
Glucose, UA: NEGATIVE mg/dL
Hgb urine dipstick: NEGATIVE
Ketones, ur: NEGATIVE mg/dL
Leukocytes,Ua: NEGATIVE
Nitrite: NEGATIVE
Protein, ur: NEGATIVE mg/dL
Specific Gravity, Urine: 1.01 (ref 1.005–1.030)
pH: 6 (ref 5.0–8.0)

## 2019-07-15 LAB — CBC
HCT: 46.6 % (ref 39.0–52.0)
HCT: 42.2 % (ref 39.0–52.0)
Hemoglobin: 15.1 g/dL (ref 13.0–17.0)
Hemoglobin: 13.9 g/dL (ref 13.0–17.0)
MCH: 27.5 pg (ref 26.0–34.0)
MCH: 27.4 pg (ref 26.0–34.0)
MCHC: 32.4 g/dL (ref 30.0–36.0)
MCHC: 32.9 g/dL (ref 30.0–36.0)
MCV: 84.9 fL (ref 80.0–100.0)
MCV: 83.2 fL (ref 80.0–100.0)
Platelets: 271 10*3/uL (ref 150–400)
Platelets: 229 K/uL (ref 150–400)
RBC: 5.49 MIL/uL (ref 4.22–5.81)
RBC: 5.07 MIL/uL (ref 4.22–5.81)
RDW: 16.8 % — ABNORMAL HIGH (ref 11.5–15.5)
RDW: 16.7 % — ABNORMAL HIGH (ref 11.5–15.5)
WBC: 8.5 10*3/uL (ref 4.0–10.5)
WBC: 7.3 K/uL (ref 4.0–10.5)
nRBC: 0 % (ref 0.0–0.2)
nRBC: 0 % (ref 0.0–0.2)

## 2019-07-15 LAB — COMPREHENSIVE METABOLIC PANEL
ALT: 16 U/L (ref 0–44)
ALT: 16 U/L (ref 0–44)
AST: 21 U/L (ref 15–41)
AST: 21 U/L (ref 15–41)
Albumin: 4.2 g/dL (ref 3.5–5.0)
Albumin: 3.9 g/dL (ref 3.5–5.0)
Alkaline Phosphatase: 80 U/L (ref 38–126)
Alkaline Phosphatase: 75 U/L (ref 38–126)
Anion gap: 17 — ABNORMAL HIGH (ref 5–15)
Anion gap: 15 (ref 5–15)
BUN: 15 mg/dL (ref 6–20)
BUN: 14 mg/dL (ref 6–20)
CO2: 20 mmol/L — ABNORMAL LOW (ref 22–32)
CO2: 20 mmol/L — ABNORMAL LOW (ref 22–32)
Calcium: 9 mg/dL (ref 8.9–10.3)
Calcium: 8.5 mg/dL — ABNORMAL LOW (ref 8.9–10.3)
Chloride: 103 mmol/L (ref 98–111)
Chloride: 105 mmol/L (ref 98–111)
Creatinine, Ser: 1.17 mg/dL (ref 0.61–1.24)
Creatinine, Ser: 1.13 mg/dL (ref 0.61–1.24)
GFR calc Af Amer: >60 mL/min (ref >60)
GFR calc Af Amer: >60 mL/min (ref >60)
GFR calc non Af Amer: >60 mL/min (ref >60)
GFR calc non Af Amer: >60 mL/min (ref >60)
Glucose, Bld: 139 mg/dL — ABNORMAL HIGH (ref 70–99)
Glucose, Bld: 130 mg/dL — ABNORMAL HIGH (ref 70–99)
Potassium: 3.4 mmol/L — ABNORMAL LOW (ref 3.5–5.1)
Potassium: 2.8 mmol/L — ABNORMAL LOW (ref 3.5–5.1)
Sodium: 140 mmol/L (ref 135–145)
Sodium: 140 mmol/L (ref 135–145)
Total Bilirubin: 0.8 mg/dL (ref 0.3–1.2)
Total Bilirubin: 0.8 mg/dL (ref 0.3–1.2)
Total Protein: 7.3 g/dL (ref 6.5–8.1)
Total Protein: 6.6 g/dL (ref 6.5–8.1)

## 2019-07-15 LAB — ETHANOL
Alcohol, Ethyl (B): 120 mg/dL — ABNORMAL HIGH (ref <10)
Alcohol, Ethyl (B): 286 mg/dL — ABNORMAL HIGH (ref <10)

## 2019-07-15 LAB — URINE DRUG SCREEN, QUALITATIVE (ARMC ONLY)
Amphetamines, Ur Screen: NOT DETECTED
Barbiturates, Ur Screen: NOT DETECTED
Benzodiazepine, Ur Scrn: POSITIVE — AB
Cannabinoid 50 Ng, Ur ~~LOC~~: POSITIVE — AB
Cocaine Metabolite,Ur ~~LOC~~: NOT DETECTED
MDMA (Ecstasy)Ur Screen: NOT DETECTED
Methadone Scn, Ur: NOT DETECTED
Opiate, Ur Screen: NOT DETECTED
Phencyclidine (PCP) Ur S: NOT DETECTED
Tricyclic, Ur Screen: NOT DETECTED

## 2019-07-15 LAB — MAGNESIUM: Magnesium: 1.7 mg/dL (ref 1.7–2.4)

## 2019-07-15 MED ORDER — VITAMIN B-1 100 MG PO TABS: 100.0000 mg | ORAL_TABLET | Freq: Every day | ORAL | Status: DC

## 2019-07-15 MED ORDER — LORAZEPAM 2 MG PO TABS: 0.0000 mg | ORAL_TABLET | Freq: Two times a day (BID) | ORAL | Status: DC

## 2019-07-15 MED ORDER — LORAZEPAM 2 MG/ML IJ SOLN
3.0000 mg | Freq: Once | INTRAMUSCULAR | Status: AC
  Administered 2019-07-15: 3 mg via INTRAVENOUS
  Filled 2019-07-15: qty 2

## 2019-07-15 MED ORDER — LORAZEPAM 2 MG PO TABS: 0.0000 mg | ORAL_TABLET | Freq: Four times a day (QID) | ORAL | Status: DC

## 2019-07-15 MED ORDER — LORAZEPAM 2 MG/ML IJ SOLN: 0.0000 mg | Freq: Four times a day (QID) | INTRAMUSCULAR | Status: DC

## 2019-07-15 MED ORDER — TETANUS-DIPHTH-ACELL PERTUSSIS 5-2.5-18.5 LF-MCG/0.5 IM SUSP
0.5000 mL | Freq: Once | INTRAMUSCULAR | Status: AC
  Administered 2019-07-16: 0.5 mL via INTRAMUSCULAR
  Filled 2019-07-15: qty 0.5

## 2019-07-15 MED ORDER — SODIUM CHLORIDE 0.9 % IV BOLUS
1000.0000 mL | Freq: Once | INTRAVENOUS | Status: AC
  Administered 2019-07-15: 1000 mL via INTRAVENOUS

## 2019-07-15 MED ORDER — LORAZEPAM 2 MG/ML IJ SOLN: 0.0000 mg | Freq: Two times a day (BID) | INTRAMUSCULAR | Status: DC

## 2019-07-15 MED ORDER — SODIUM CHLORIDE 0.9 % IV SOLN
1.0000 mg | Freq: Once | INTRAVENOUS | Status: DC
  Filled 2019-07-15: qty 0.2

## 2019-07-15 MED ORDER — LORAZEPAM 2 MG/ML IJ SOLN
0.0000 mg | Freq: Four times a day (QID) | INTRAMUSCULAR | Status: DC
  Administered 2019-07-15: 2 mg via INTRAVENOUS
  Filled 2019-07-15: qty 1

## 2019-07-15 MED ORDER — MAGNESIUM SULFATE 2 GM/50ML IV SOLN
2.0000 g | Freq: Once | INTRAVENOUS | Status: AC
  Administered 2019-07-16: 2 g via INTRAVENOUS
  Filled 2019-07-15: qty 50

## 2019-07-15 MED ORDER — POTASSIUM CHLORIDE CRYS ER 20 MEQ PO TBCR
40.0000 meq | EXTENDED_RELEASE_TABLET | Freq: Once | ORAL | Status: AC
  Administered 2019-07-16: 01:00:00 40 meq via ORAL
  Filled 2019-07-15: qty 2

## 2019-07-15 MED ORDER — THIAMINE HCL 100 MG/ML IJ SOLN: 100.0000 mg | Freq: Every day | INTRAMUSCULAR | Status: DC

## 2019-07-15 MED ORDER — SODIUM CHLORIDE 0.9 % IV SOLN
1.0000 mg | Freq: Once | INTRAVENOUS | Status: AC
  Administered 2019-07-16: 1 mg via INTRAVENOUS

## 2019-07-15 MED ORDER — POTASSIUM CHLORIDE CRYS ER 20 MEQ PO TBCR
40.0000 meq | EXTENDED_RELEASE_TABLET | Freq: Once | ORAL | Status: AC
  Administered 2019-07-15: 40 meq via ORAL
  Filled 2019-07-15: qty 2

## 2019-07-15 MED ORDER — THIAMINE HCL 100 MG/ML IJ SOLN
100.0000 mg | Freq: Once | INTRAMUSCULAR | Status: AC
  Administered 2019-07-15: 100 mg via INTRAVENOUS
  Filled 2019-07-15: qty 2

## 2019-07-15 MED ORDER — POTASSIUM CHLORIDE 10 MEQ/100ML IV SOLN
10.0000 meq | INTRAVENOUS | Status: AC
  Administered 2019-07-16 (×2): 10 meq via INTRAVENOUS
  Filled 2019-07-15 (×2): qty 100

## 2019-07-15 NOTE — Discharge Instructions (Addendum)
You do not want to stay in the hospital or go to rehab at this time, this of course is your choice but limits her ability to take care of you.  We suggest you start taking over-the-counter magnesium supplements as directed.  Return to the emergency room for any new or worrisome symptoms.

## 2019-07-15 NOTE — ED Notes (Signed)
Pt wanting to go home. Does not want to stay.  Reports has to take care of his wife.  Was given resources by TTS.  Secretary to call cab for pt to have ride home.

## 2019-07-15 NOTE — BH Assessment (Signed)
TTS spoke with patient to inquire about treatment for alcohol use. Pt refused detox treatment because he reports if he goes to detox treatment, no one will be home to help take care of his wife. Pt reports having hx of shakes and vomiting. He denied experiencing any current withdrawals.  Pt was provided with [facility] outpatient resources.

## 2019-07-15 NOTE — ED Notes (Signed)
Pt walked out to cab.

## 2019-07-15 NOTE — ED Provider Notes (Signed)
[facility] Emergency Department Provider Note  ____________________________________________   First MD Initiated Contact with Patient 07/15/19 2253     (approximate)  I have reviewed the triage vital signs and the nursing notes.   HISTORY  Chief Complaint Nausea    HPI Miguel [name] is a 58 y.o. male who has a history of alcohol abuse who presents with report of nausea.  However when I discussed with patient he reports cutting his stomach.  He is unsure why he did it.  He denies SI but there are lacerations that are superficial on his abdomen.  These are multiple of them.  Having some mild pain, constant, nothing makes better, nothing makes it worse.  He says that he does have a wife who is handicapped that he has take care of but given the self-harm attempt will have to IVC patient.  Patient does have use alcohol.  He was just seen here recently and offered admission for alcohol withdrawal but he declined.  Patient says he did go home and drinks more alcohol   Past Medical History:  Diagnosis Date  . Alcohol abuse   . Asthma   . GERD (gastroesophageal reflux disease)   . Gout   . Hypertension   . Kidney stone   . OCD (obsessive compulsive disorder)   . Renal colic     Patient Active Problem List   Diagnosis Date Noted  . Left-sided chest wall pain 06/14/2019  . Alcoholic cirrhosis of liver without ascites ([place]) 06/14/2019  . Alcohol abuse 06/14/2019  . AKI (acute kidney injury) ([place]) 05/27/2019  . Alcohol withdrawal ([place]) 05/07/2019  . Alcoholic intoxication with complication ([place])   . Sepsis ([place]) 03/08/2019  . Breast lump or mass 10/04/2018  . Sepsis secondary to UTI ([place]) 09/14/2018  . Asthma 07/07/2018  . GERD (gastroesophageal reflux disease) 07/07/2018  . OCD (obsessive compulsive disorder) 07/07/2018  . Self-inflicted laceration of wrist 03/10/2018  . Leg hematoma 12/25/2017  . Suicide and self-inflicted injury by cutting and piercing  instrument ([place]) 03/10/2017  . Severe recurrent major 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 ([place]) 03/09/2017  . Substance induced mood disorder ([place]) 08/15/2016  . Involuntary commitment 08/15/2016  . Alcohol use disorder, severe, dependence ([place]) 02/05/2016  . Hypertension 12/05/2015  . Tachycardia 12/05/2015  . Gout 11/13/2015  . Chronic back pain 05/02/2015    Past Surgical History:  Procedure Laterality Date  . CHOLECYSTECTOMY  2012  . EXTRACORPOREAL SHOCK WAVE LITHOTRIPSY Left 01/12/2019   Procedure: EXTRACORPOREAL SHOCK WAVE LITHOTRIPSY (ESWL);  Surgeon: Billey Co, MD;  Location: [facility] ORS;  Service: Urology;  Laterality: Left;    Prior to Admission medications   Medication Sig Start Date End Date Taking? Authorizing Provider  albuterol (VENTOLIN HFA) 108 (90 Base) MCG/ACT inhaler Inhale 2 puffs into the lungs every 4 (four) hours as needed. Patient taking differently: Inhale 2 puffs into the lungs every 4 (four) hours as needed for wheezing or shortness of breath.  09/01/18   Tukov-Yual, Arlyss Gandy, NP  allopurinol (ZYLOPRIM) 300 MG tablet Take 1 tablet (300 mg total) by mouth daily. 09/01/18   Tukov-Yual, Arlyss Gandy, NP  colchicine 0.6 MG tablet Take 1 tablet (0.6 mg total) by mouth daily. Patient taking differently: Take 0.6 mg by mouth daily as needed.  03/09/19   Lang Snow, NP  diclofenac sodium (VOLTAREN) 1 % GEL Apply 2 g topically 4 (four) times daily. 06/14/19   Iloabachie, Chioma E, NP  docusate sodium (COLACE)  100 MG capsule Take 1 tablet once or twice daily as needed for constipation while taking narcotic pain medicine 01/09/19   Hinda Kehr, MD  Fluticasone-Salmeterol (ADVAIR DISKUS) 100-50 MCG/DOSE AEPB Inhale 1 puff into the lungs 2 (two) times daily. 09/01/18   Tukov-Yual, Arlyss Gandy, NP  folic acid (FOLVITE) 1 MG tablet Take 1 tablet (1 mg total) by mouth daily. 09/01/18   Tukov-Yual, Arlyss Gandy, NP  gabapentin (NEURONTIN) 100 MG capsule Take  2 capsules (200 mg total) by mouth 3 (three) times daily. 04/21/19   Patrecia Pour, NP  lisinopril (PRINIVIL,ZESTRIL) 20 MG tablet Take 1 tablet (20 mg total) by mouth daily. 02/21/19   Iloabachie, Chioma E, NP  methocarbamol (ROBAXIN) 500 MG tablet Take 1 tablet (500 mg total) by mouth 4 (four) times daily. 07/12/19   Duanne Guess, PA-C  Multiple Vitamins-Minerals (MULTIVITAMIN WITH MINERALS) tablet Take 1 tablet by mouth daily. 09/01/18   Tukov-Yual, Arlyss Gandy, NP  promethazine (PHENERGAN) 12.5 MG tablet Take 1 tablet (12.5 mg total) by mouth every 6 (six) hours as needed. 05/17/19   Merlyn Lot, MD  tiZANidine (ZANAFLEX) 4 MG tablet Take 0.5 tablets (2 mg total) by mouth every 8 (eight) hours as needed for muscle spasms. 04/26/19   Iloabachie, Chioma E, NP    Allergies Percocet [oxycodone-acetaminophen]  Family History  Problem Relation Age of Onset  . Alcohol abuse Father   . Breast cancer Mother 59    Social History Social History   Tobacco Use  . Smoking status: Former Research scientist (life sciences)  . Smokeless tobacco: Never Used  . Tobacco comment: quit 30 years ago  Substance Use Topics  . Alcohol use: Yes    Alcohol/week: 2.0 standard drinks    Types: 2 Glasses of wine per week    Comment: daily  . Drug use: No      Review of Systems Constitutional: No fever/chills Eyes: No visual changes. ENT: No sore throat. Cardiovascular: Denies chest pain. Respiratory: Denies shortness of breath. Gastrointestinal: No abdominal pain.  no vomiting.  No diarrhea.  No constipation.  Alcohol use positive nausea. Genitourinary: Negative for dysuria. Musculoskeletal: Negative for back pain. Skin: Cuts on his abdomen. Neurological: Negative for headaches, focal weakness or numbness. All other ROS negative ____________________________________________   PHYSICAL EXAM:  VITAL SIGNS: ED Triage Vitals  Enc Vitals Group     BP 07/15/19 2230 (!) 173/11     Pulse Rate 07/15/19 2230 (!) 108      Resp 07/15/19 2230 20     Temp 07/15/19 2230 98.3 F (36.8 C)     Temp Source 07/15/19 2238 Oral     SpO2 07/15/19 2230 94 %     Weight 07/15/19 2241 185 lb (83.9 kg)     Height 07/15/19 2241 6' (1.829 m)     Head Circumference --      Peak Flow --      Pain Score 07/15/19 2239 5     Pain Loc --      Pain Edu? --      Excl. in [place]? --     Constitutional: Alert and oriented. Well appearing and in no acute distress. Eyes: Conjunctivae are normal. EOMI. Head: Atraumatic. Nose: No congestion/rhinnorhea. Mouth/Throat: Mucous membranes are moist.   Neck: No stridor. Trachea Midline. FROM Cardiovascular: Tachycardic regular rhythm. Grossly normal heart sounds.  Good peripheral circulation. Respiratory: Normal respiratory effort.  No retractions. Lungs CTAB. Gastrointestinal: Soft and nontender. No distention. No abdominal bruits.  Multiple superficial  lacerations on his abdomen Musculoskeletal: No lower extremity tenderness nor edema.  No joint effusions. Neurologic:  Normal speech and language. No gross focal neurologic deficits are appreciated.  Skin:  Skin is warm, dry and intact. No rash noted. Psychiatric: Mood and affect are normal. Speech and behavior are normal. GU: Deferred   ____________________________________________   LABS (all labs ordered are listed, but only abnormal results are displayed)  Labs Reviewed  COMPREHENSIVE METABOLIC PANEL - Abnormal; Notable for the following components:      Result Value   Potassium 2.8 (*)    CO2 20 (*)    Glucose, Bld 130 (*)    Calcium 8.5 (*)    All other components within normal limits  CBC - Abnormal; Notable for the following components:   RDW 16.7 (*)    All other components within normal limits  URINALYSIS, COMPLETE (UACMP) WITH MICROSCOPIC - Abnormal; Notable for the following components:   Color, Urine YELLOW (*)    APPearance CLEAR (*)    All other components within normal limits  CBC WITH DIFFERENTIAL/PLATELET -  Abnormal; Notable for the following components:   RDW 16.7 (*)    All other components within normal limits  ETHANOL - Abnormal; Notable for the following components:   Alcohol, Ethyl (B) 286 (*)    All other components within normal limits  LIPASE, BLOOD  ACETAMINOPHEN LEVEL  SALICYLATE LEVEL   ____________________________________________  PROCEDURES  Procedure(s) performed (including Critical Care):  Procedures   ____________________________________________   INITIAL IMPRESSION / ASSESSMENT AND PLAN / ED COURSE  STEPHANIE TOUHEY was evaluated in Emergency Department on 07/15/2019 for the symptoms described in the history of present illness. He was evaluated in the context of the global COVID-19 pandemic, which necessitated consideration that the patient might be at risk for infection with the SARS-CoV-2 virus that causes COVID-19. Institutional protocols and algorithms that pertain to the evaluation of patients at risk for COVID-19 are in a state of rapid change based on information released by regulatory bodies including the CDC and federal and state organizations. These policies and algorithms were followed during the patient's care in the ED.    Patient is a 58 year old who presents with alcohol abuse as well as self-harm.  Lacerations are superficial and do not require sutures.  However will update tetanus.  Patient is hypertensive tachycardic will start on CIWA protocol.  Will IVC patient and discussed with psychiatric team.  Patient does have a low potassium level.  Will give IV potassium 20 mEq and 40 of p.o.  We will also supplement magnesium.  Pt is without any acute medical complaints. No exam findings to suggest medical cause of current presentation. Will order psychiatric screening labs and discuss further w/ psychiatric service.  D/d includes but is not limited to psychiatric disease, behavioral/personality disorder, inadequate socioeconomic support, medical.  Based on HPI,  exam, unremarkable labs, no concern for acute medical problem at this time. No rigidity, clonus, hyperthermia, focal neurologic deficit, diaphoresis, tachycardia, meningismus, ataxia, gait abnormality or other finding to suggest this visit represents a non-psychiatric problem. Screening labs reviewed.    Given this, pt medically cleared, to be dispositioned per Psych.   ____________________________________________   FINAL CLINICAL IMPRESSION(S) / ED DIAGNOSES   Final diagnoses:  Alcohol abuse  At high risk for self harm     MEDICATIONS GIVEN DURING THIS VISIT:  Medications  LORazepam (ATIVAN) injection 0-4 mg (2 mg Intravenous Given 07/15/19 2305)    Or  LORazepam (ATIVAN)  tablet 0-4 mg ( Oral See Alternative 07/15/19 2305)  LORazepam (ATIVAN) injection 0-4 mg (has no administration in time range)    Or  LORazepam (ATIVAN) tablet 0-4 mg (has no administration in time range)  thiamine (VITAMIN B-1) tablet 100 mg (has no administration in time range)    Or  thiamine (B-1) injection 100 mg (has no administration in time range)  potassium chloride SA (K-DUR) CR tablet 40 mEq (has no administration in time range)  Tdap (BOOSTRIX) injection 0.5 mL (has no administration in time range)     ED Discharge Orders    None       Note:  This document was prepared using Dragon voice recognition software and may include unintentional dictation errors.   Vanessa Ashley, MD 07/15/19 [id number]

## 2019-07-15 NOTE — ED Notes (Signed)
Pt with multiple criss cross scratch marks with dried blood on abdomen. Pt stated he did this to himself after being discharged from ED this afternoon

## 2019-07-15 NOTE — ED Notes (Addendum)
FIRST NURSE NOTE:  Pt c/o shaking all over and nauseated. Pt ambulatory with a steady gait.

## 2019-07-15 NOTE — ED Notes (Signed)
Urine sent to lab. Patient currently resting.

## 2019-07-15 NOTE — ED Provider Notes (Addendum)
[facility] Emergency Department Provider Note  ____________________________________________   I have reviewed the triage vital signs and the nursing notes. Where available I have reviewed prior notes and, if possible and indicated, outside hospital notes.   Patient seen and evaluated during the coronavirus epidemic during a time with low staffing  Patient seen for the symptoms described in the history of present illness. She was evaluated in the context of the global COVID-19 pandemic, which necessitated consideration that the patient might be at risk for infection with the SARS-CoV-2 virus that causes COVID-19. Institutional protocols and algorithms that pertain to the evaluation of patients at risk for COVID-19 are in a state of rapid change based on information released by regulatory bodies including the CDC and federal and state organizations. These policies and algorithms were followed during the patient's care in the ED.    HISTORY  Chief Complaint Tremors    HPI Miguel [name] is a 58 y.o. male  With a history of alcohol abuse drinks a pint today, last had alcohol he states Thursday night but had 2 pints then  feels that he is in early withdrawal had some nausea and some vomiting feels better after Ativan which I gave him upon arrival.  No fever no SI no HI no abdominal pain no other complaints.  No melena no bright red blood per rectum, at this time he is states he is sincere about wanting to quit alcohol.    Past Medical History:  Diagnosis Date  . Alcohol abuse   . Asthma   . GERD (gastroesophageal reflux disease)   . Gout   . Hypertension   . Kidney stone   . OCD (obsessive compulsive disorder)   . Renal colic     Patient Active Problem List   Diagnosis Date Noted  . Left-sided chest wall pain 06/14/2019  . Alcoholic cirrhosis of liver without ascites ([place]) 06/14/2019  . Alcohol abuse 06/14/2019  . AKI (acute kidney injury) ([place]) 05/27/2019  .  Alcohol withdrawal ([place]) 05/07/2019  . Alcoholic intoxication with complication ([place])   . Sepsis ([place]) 03/08/2019  . Breast lump or mass 10/04/2018  . Sepsis secondary to UTI ([place]) 09/14/2018  . Asthma 07/07/2018  . GERD (gastroesophageal reflux disease) 07/07/2018  . OCD (obsessive compulsive disorder) 07/07/2018  . Self-inflicted laceration of wrist 03/10/2018  . Leg hematoma 12/25/2017  . Suicide and self-inflicted injury by cutting and piercing instrument ([place]) 03/10/2017  . Severe recurrent major 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 ([place]) 03/09/2017  . Substance induced mood disorder ([place]) 08/15/2016  . Involuntary commitment 08/15/2016  . Alcohol use disorder, severe, dependence ([place]) 02/05/2016  . Hypertension 12/05/2015  . Tachycardia 12/05/2015  . Gout 11/13/2015  . Chronic back pain 05/02/2015    Past Surgical History:  Procedure Laterality Date  . CHOLECYSTECTOMY  2012  . EXTRACORPOREAL SHOCK WAVE LITHOTRIPSY Left 01/12/2019   Procedure: EXTRACORPOREAL SHOCK WAVE LITHOTRIPSY (ESWL);  Surgeon: Billey Co, MD;  Location: [facility] ORS;  Service: Urology;  Laterality: Left;    Prior to Admission medications   Medication Sig Start Date End Date Taking? Authorizing Provider  albuterol (VENTOLIN HFA) 108 (90 Base) MCG/ACT inhaler Inhale 2 puffs into the lungs every 4 (four) hours as needed. Patient taking differently: Inhale 2 puffs into the lungs every 4 (four) hours as needed for wheezing or shortness of breath.  09/01/18   Tukov-Yual, Arlyss Gandy, NP  allopurinol (ZYLOPRIM) 300 MG tablet Take 1 tablet (300 mg total) by mouth  daily. 09/01/18   Tukov-Yual, Arlyss Gandy, NP  colchicine 0.6 MG tablet Take 1 tablet (0.6 mg total) by mouth daily. Patient taking differently: Take 0.6 mg by mouth daily as needed.  03/09/19   Lang Snow, NP  diclofenac sodium (VOLTAREN) 1 % GEL Apply 2 g topically 4 (four) times daily. 06/14/19   Iloabachie, Chioma E, NP  docusate sodium  (COLACE) 100 MG capsule Take 1 tablet once or twice daily as needed for constipation while taking narcotic pain medicine 01/09/19   Hinda Kehr, MD  Fluticasone-Salmeterol (ADVAIR DISKUS) 100-50 MCG/DOSE AEPB Inhale 1 puff into the lungs 2 (two) times daily. 09/01/18   Tukov-Yual, Arlyss Gandy, NP  folic acid (FOLVITE) 1 MG tablet Take 1 tablet (1 mg total) by mouth daily. 09/01/18   Tukov-Yual, Arlyss Gandy, NP  gabapentin (NEURONTIN) 100 MG capsule Take 2 capsules (200 mg total) by mouth 3 (three) times daily. 04/21/19   Patrecia Pour, NP  lisinopril (PRINIVIL,ZESTRIL) 20 MG tablet Take 1 tablet (20 mg total) by mouth daily. 02/21/19   Iloabachie, Chioma E, NP  methocarbamol (ROBAXIN) 500 MG tablet Take 1 tablet (500 mg total) by mouth 4 (four) times daily. 07/12/19   Duanne Guess, PA-C  Multiple Vitamins-Minerals (MULTIVITAMIN WITH MINERALS) tablet Take 1 tablet by mouth daily. 09/01/18   Tukov-Yual, Arlyss Gandy, NP  promethazine (PHENERGAN) 12.5 MG tablet Take 1 tablet (12.5 mg total) by mouth every 6 (six) hours as needed. 05/17/19   Merlyn Lot, MD  tiZANidine (ZANAFLEX) 4 MG tablet Take 0.5 tablets (2 mg total) by mouth every 8 (eight) hours as needed for muscle spasms. 04/26/19   Iloabachie, Chioma E, NP    Allergies Percocet [oxycodone-acetaminophen]  Family History  Problem Relation Age of Onset  . Alcohol abuse Father   . Breast cancer Mother 83    Social History Social History   Tobacco Use  . Smoking status: Former Research scientist (life sciences)  . Smokeless tobacco: Never Used  . Tobacco comment: quit 30 years ago  Substance Use Topics  . Alcohol use: Yes    Alcohol/week: 2.0 standard drinks    Types: 2 Glasses of wine per week    Comment: daily  . Drug use: No    Review of Systems Constitutional: No fever/chills Eyes: No visual changes. ENT: No sore throat. No stiff neck no neck pain Cardiovascular: Denies chest pain. Respiratory: Denies shortness of breath. Gastrointestinal:   no  vomiting.  No diarrhea.  No constipation. Genitourinary: Negative for dysuria. Musculoskeletal: Negative lower extremity swelling Skin: Negative for rash. Neurological: Negative for severe headaches, focal weakness or numbness.   ____________________________________________   PHYSICAL EXAM:  VITAL SIGNS: ED Triage Vitals  Enc Vitals Group     BP 07/15/19 1440 (!) 176/113     Pulse Rate 07/15/19 1440 (!) 122     Resp 07/15/19 1440 20     Temp 07/15/19 1440 98.6 F (37 C)     Temp Source 07/15/19 1440 Oral     SpO2 07/15/19 1440 97 %     Weight 07/15/19 1441 185 lb (83.9 kg)     Height 07/15/19 1441 6' (1.829 m)     Head Circumference --      Peak Flow --      Pain Score 07/15/19 1441 6     Pain Loc --      Pain Edu? --      Excl. in [place]? --     Constitutional: Alert and oriented. Well  appearing and in no acute distress. Eyes: Conjunctivae are normal Head: Atraumatic HEENT: No congestion/rhinnorhea. Mucous membranes are moist.  Oropharynx non-erythematous Neck:   Nontender with no meningismus, no masses, no stridor Cardiovascular: mild tachycardia, regular rhythm. Grossly normal heart sounds.  Good peripheral circulation. Respiratory: Normal respiratory effort.  No retractions. Lungs CTAB. Abdominal: Soft and nontender. No distention. No guarding no rebound Back:  There is no focal tenderness or step off.  there is no midline tenderness there are no lesions noted. there is no CVA tenderness Musculoskeletal: No lower extremity tenderness, no upper extremity tenderness. No joint effusions, no DVT signs strong distal pulses no edema Neurologic:  Normal speech and language. No gross focal neurologic deficits are appreciated.  Skin:  Skin is warm, dry and intact. No rash noted. Psychiatric: Mood and affect are anxious. Speech and behavior are normal.  ____________________________________________   LABS (all labs ordered are listed, but only abnormal results are  displayed)  Labs Reviewed  COMPREHENSIVE METABOLIC PANEL - Abnormal; Notable for the following components:      Result Value   Potassium 3.4 (*)    CO2 20 (*)    Glucose, Bld 139 (*)    Anion gap 17 (*)    All other components within normal limits  ETHANOL - Abnormal; Notable for the following components:   Alcohol, Ethyl (B) 120 (*)    All other components within normal limits  CBC - Abnormal; Notable for the following components:   RDW 16.8 (*)    All other components within normal limits  URINE DRUG SCREEN, QUALITATIVE ([facility] ONLY) - Abnormal; Notable for the following components:   Cannabinoid 50 Ng, Ur [place] POSITIVE (*)    Benzodiazepine, Ur Scrn POSITIVE (*)    All other components within normal limits    Pertinent labs  results that were available during my care of the patient were reviewed by me and considered in my medical decision making (see chart for details). ____________________________________________  EKG  I personally interpreted any EKGs ordered by me or triage  ____________________________________________  RADIOLOGY  Pertinent labs & imaging results that were available during my care of the patient were reviewed by me and considered in my medical decision making (see chart for details). If possible, patient and/or family made aware of any abnormal findings.  No results found. ____________________________________________    PROCEDURES  Procedure(s) performed: None  Procedures  Critical Care performed: None  ____________________________________________   INITIAL IMPRESSION / ASSESSMENT AND PLAN / ED COURSE  Pertinent labs & imaging results that were available during my care of the patient were reviewed by me and considered in my medical decision making (see chart for details).   Patient here with alcohol of 120 has not drank alcohol in a couple days he states, is hard to know exactly when the last time he actually did have alcohol was, he was little  tachycardic and slightly tremulous upon arrival however that is rapidly resolving with Ativan.  We will give him some IV fluid will have TTS evaluate him for possible rehab as placement.  There is no evidence of hallucinations or other significant withdrawal symptoms at this time and he is in no acute distress  ----------------------------------------- 5:24 PM on 07/15/2019 -----------------------------------------  Patient here refusing inpatient and outpatient rehab, he states he does feel somewhat anxious but feels better after the Ativan.  His heart rate is mildly elevated at this time but he does not want a be admitted to the hospital he  wants to go home.  I think he is awake and alert and able to make these decisions.  Given that he refuses any care that I can provide him we will discharge him.  His elevated heart rate is likely more because of his mild anxiety than any acute withdrawal but given that he declines admission at this time we will encourage him to return if he changes his mind or has any other new or worrisome symptoms.  He was evaluated by TTS  Heart rate is currently 98, he feels much better.  Does not want to wait for magnesium results.  Understands that magnesium results I cannot tell if he needs magnesium supplementation I have however advised him to start magnesium supplementation at home given his drinking habits.    ____________________________________________   FINAL CLINICAL IMPRESSION(S) / ED DIAGNOSES  Final diagnoses:  None      This chart was dictated using voice recognition software.  Despite best efforts to proofread,  errors can occur which can change meaning.      [name] Amor, MD 07/15/19 1615    [name] Amor, MD 07/15/19 1725    [name] Amor, MD 07/15/19 [phone]

## 2019-07-15 NOTE — ED Triage Notes (Signed)
States began shaking today. Trying to stop drinking and last drink 2 days ago.

## 2019-07-15 NOTE — ED Notes (Signed)
Pt told this RN that last alcoholic drink was  Thursday, then stated he had several shots of alcohol after being discharged from ED this afternoon

## 2019-07-15 NOTE — ED Triage Notes (Signed)
Pt was seen in ER this am. Pt returned complaining of nausea and vomiting.

## 2019-07-16 LAB — ACETAMINOPHEN LEVEL: Acetaminophen (Tylenol), Serum: <10 ug/mL — ABNORMAL LOW (ref 10–30)

## 2019-07-16 LAB — SALICYLATE LEVEL: Salicylate Lvl: <7 mg/dL (ref 2.8–30.0)

## 2019-07-16 NOTE — Discharge Instructions (Addendum)
You have been seen in the Emergency Department (ED)  today for a psychiatric complaint.  You have been evaluated by psychiatry and we believe you are safe to be discharged from the hospital.   ° °Please return to the Emergency Department (ED)  immediately if you have ANY thoughts of hurting yourself or anyone else, so that we may help you. ° °Please avoid alcohol and drug use. ° °Follow up with your doctor and/or therapist as soon as possible regarding today's ED  visit.  ° °You may call crisis hotline for [place] at [phone]. ° °

## 2019-07-16 NOTE — ED Provider Notes (Signed)
_________________________ 5:45 AM on 07/16/2019 ----------------------------------------- Patient no longer wishes to to go to detox.  Is requesting to go home.  Patient has the keys to his home.  He is clinically sober, ambulating, tolerating p.o.  Has been cleared by psychiatry and IVC has been lifted.  Will discharge home with follow-up with his doctor.  Discussed my standard return precautions.  Counseling on alcohol cessation was provided for 15 minutes.   Rudene Re, MD 07/16/19 [phone]

## 2019-07-16 NOTE — ED Notes (Addendum)
Pt requesting to go home via taxi cab (pt pay), "some ativan", ginger ale and discharge because "I want to be there with my wife - I take care of her" --- Dr Alfred Levins notified.  Pt declines to wait for social work consult so wife can be taken care of while pt goes to rehab  Pt ambulatory to toilet, ginger ale given, [facility] left for pt

## 2019-07-16 NOTE — ED Notes (Signed)
Pt SOC completed; pt to toilet and returned to bed

## 2019-07-16 NOTE — ED Provider Notes (Signed)
_________________________ 3:23 AM on 07/16/2019 -----------------------------------------  Patient evaluated by tele-psychiatrist who lifted IVC.  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  He would be willing to be placed somewhere for detox as long as his wife has a place to go or care at home while he is gone.  The psychiatrist wanted a social work consulted to see if the wife can be placed in a nursing home for a few weeks or if they can extend her home health services to daily as opposed to 3 times a week.  A consult for social work has been placed.  Patient is under CIWA.   Rudene Re, MD 07/16/19 [phone]

## 2019-07-16 NOTE — ED Notes (Signed)
[facility] left for pt, pt sleeping in hall bed

## 2019-07-16 NOTE — ED Notes (Signed)
Peripheral IV discontinued. Catheter intact. No signs of infiltration or redness. Gauze applied to IV site.  Discharge instructions reviewed with patient. Questions fielded by this RN. Patient verbalizes understanding of instructions. Patient discharged home in stable condition per [place]. No acute distress noted at time of discharge.   

## 2019-07-16 NOTE — ED Notes (Signed)
Report started to [facility] att - pt to [facility] room at 147

## 2019-07-19 ENCOUNTER — Emergency Department
Admission: EM | Admit: 2019-07-19 | Discharge: 2019-07-19 | Disposition: A | Discharge status: Home / Self Care | Payer: Self-pay | Attending: Emergency Medicine | Admitting: Emergency Medicine

## 2019-07-19 ENCOUNTER — Emergency Department: Payer: Self-pay

## 2019-07-19 ENCOUNTER — Other Ambulatory Visit: Payer: Self-pay

## 2019-07-19 ENCOUNTER — Encounter: Payer: Self-pay | Admitting: Emergency Medicine

## 2019-07-19 DIAGNOSIS — J45909 Unspecified asthma, uncomplicated: Secondary | ICD-10-CM | POA: Insufficient documentation

## 2019-07-19 DIAGNOSIS — N3090 Cystitis, unspecified without hematuria: Secondary | ICD-10-CM

## 2019-07-19 DIAGNOSIS — Z79899 Other long term (current) drug therapy: Secondary | ICD-10-CM | POA: Insufficient documentation

## 2019-07-19 DIAGNOSIS — Z87891 Personal history of nicotine dependence: Secondary | ICD-10-CM | POA: Insufficient documentation

## 2019-07-19 DIAGNOSIS — N3001 Acute cystitis with hematuria: Secondary | ICD-10-CM | POA: Insufficient documentation

## 2019-07-19 DIAGNOSIS — I10 Essential (primary) hypertension: Secondary | ICD-10-CM | POA: Insufficient documentation

## 2019-07-19 DIAGNOSIS — F10930 Alcohol use, unspecified with withdrawal, uncomplicated: Secondary | ICD-10-CM

## 2019-07-19 DIAGNOSIS — F10230 Alcohol dependence with withdrawal, uncomplicated: Secondary | ICD-10-CM | POA: Insufficient documentation

## 2019-07-19 LAB — URINALYSIS, COMPLETE (UACMP) WITH MICROSCOPIC
Bacteria, UA: NONE SEEN
Glucose, UA: NEGATIVE mg/dL
Ketones, ur: 5 mg/dL — AB
Nitrite: NEGATIVE
Protein, ur: 100 mg/dL — AB
RBC / HPF: >50 RBC/hpf — ABNORMAL HIGH (ref 0–5)
Specific Gravity, Urine: 1.02 (ref 1.005–1.030)
pH: 5 (ref 5.0–8.0)

## 2019-07-19 LAB — LIPASE, BLOOD: Lipase: 52 U/L — ABNORMAL HIGH (ref 11–51)

## 2019-07-19 LAB — CBC WITH DIFFERENTIAL/PLATELET
Abs Immature Granulocytes: 0.02 10*3/uL (ref 0.00–0.07)
Basophils Absolute: 0 10*3/uL (ref 0.0–0.1)
Basophils Relative: 1 %
Eosinophils Absolute: 0.1 10*3/uL (ref 0.0–0.5)
Eosinophils Relative: 1 %
HCT: 38.5 % — ABNORMAL LOW (ref 39.0–52.0)
Hemoglobin: 12.7 g/dL — ABNORMAL LOW (ref 13.0–17.0)
Immature Granulocytes: 0 %
Lymphocytes Relative: 26 %
Lymphs Abs: 1.6 10*3/uL (ref 0.7–4.0)
MCH: 28.2 pg (ref 26.0–34.0)
MCHC: 33 g/dL (ref 30.0–36.0)
MCV: 85.4 fL (ref 80.0–100.0)
Monocytes Absolute: 0.9 10*3/uL (ref 0.1–1.0)
Monocytes Relative: 14 %
Neutro Abs: 3.6 10*3/uL (ref 1.7–7.7)
Neutrophils Relative %: 58 %
Platelets: 198 10*3/uL (ref 150–400)
RBC: 4.51 MIL/uL (ref 4.22–5.81)
RDW: 16.1 % — ABNORMAL HIGH (ref 11.5–15.5)
WBC: 6.2 10*3/uL (ref 4.0–10.5)
nRBC: 0 % (ref 0.0–0.2)

## 2019-07-19 LAB — COMPREHENSIVE METABOLIC PANEL
ALT: 15 U/L (ref 0–44)
AST: 14 U/L — ABNORMAL LOW (ref 15–41)
Albumin: 3.9 g/dL (ref 3.5–5.0)
Alkaline Phosphatase: 76 U/L (ref 38–126)
Anion gap: 12 (ref 5–15)
BUN: 8 mg/dL (ref 6–20)
CO2: 22 mmol/L (ref 22–32)
Calcium: 9.2 mg/dL (ref 8.9–10.3)
Chloride: 104 mmol/L (ref 98–111)
Creatinine, Ser: 1.14 mg/dL (ref 0.61–1.24)
GFR calc Af Amer: >60 mL/min (ref >60)
GFR calc non Af Amer: >60 mL/min (ref >60)
Glucose, Bld: 143 mg/dL — ABNORMAL HIGH (ref 70–99)
Potassium: 3.3 mmol/L — ABNORMAL LOW (ref 3.5–5.1)
Sodium: 138 mmol/L (ref 135–145)
Total Bilirubin: 0.6 mg/dL (ref 0.3–1.2)
Total Protein: 6.7 g/dL (ref 6.5–8.1)

## 2019-07-19 LAB — TROPONIN I (HIGH SENSITIVITY): Troponin I (High Sensitivity): 3 ng/L (ref <18)

## 2019-07-19 LAB — ETHANOL: Alcohol, Ethyl (B): <10 mg/dL (ref <10)

## 2019-07-19 MED ORDER — CEFUROXIME AXETIL 250 MG PO TABS: 250.0000 mg | ORAL_TABLET | Freq: Two times a day (BID) | ORAL | 0 refills | Status: DC

## 2019-07-19 MED ORDER — CHLORDIAZEPOXIDE HCL 25 MG PO CAPS: 25.0000 mg | ORAL_CAPSULE | Freq: Three times a day (TID) | ORAL | 0 refills | Status: DC | PRN

## 2019-07-19 MED ORDER — LORAZEPAM 2 MG/ML IJ SOLN
1.0000 mg | Freq: Once | INTRAMUSCULAR | Status: AC
  Administered 2019-07-19: 1 mg via INTRAVENOUS
  Filled 2019-07-19: qty 1

## 2019-07-19 NOTE — ED Provider Notes (Signed)
[facility] Emergency Department Provider Note  ____________________________________________   First MD Initiated Contact with Patient 07/19/19 [phone]     (approximate)  I have reviewed the triage vital signs and the nursing notes.   HISTORY  Chief Complaint Abdominal Pain    HPI Miguel [name] is a 58 y.o. male with alcohol abuse, hypertension, kidney stones who presents with abdominal pain.  Patient was discharged on 8/30.  Since then he has been trying to cut down his drinking.  He did not drink for the first day or 2.  He has taken 1 airplane bottle shot once in the morning and once at night to help with some of the shaking.  However today he developed worsening hot sweats, abdominal pain and noted some blood in his urine.  He has had a history of a kidney stone that was 6 mm.  His abdominal pain is his lower abdomen, constant, nothing makes it better, nothing makes it worse.  He is not had any vomiting.  The triage note mentioned blood with wiping after bowel movement but patient says that it was more so from his urine.  No blood in his stool.  Patient says that the blood in his urine has gotten better and now his urine is clear again.        Past Medical History:  Diagnosis Date  . Alcohol abuse   . Asthma   . GERD (gastroesophageal reflux disease)   . Gout   . Hypertension   . Kidney stone   . OCD (obsessive compulsive disorder)   . Renal colic     Patient Active Problem List   Diagnosis Date Noted  . Left-sided chest wall pain 06/14/2019  . Alcoholic cirrhosis of liver without ascites ([place]) 06/14/2019  . Alcohol abuse 06/14/2019  . AKI (acute kidney injury) ([place]) 05/27/2019  . Alcohol withdrawal ([place]) 05/07/2019  . Alcoholic intoxication with complication ([place])   . Sepsis ([place]) 03/08/2019  . Breast lump or mass 10/04/2018  . Sepsis secondary to UTI ([place]) 09/14/2018  . Asthma 07/07/2018  . GERD (gastroesophageal reflux disease) 07/07/2018   . OCD (obsessive compulsive disorder) 07/07/2018  . Self-inflicted laceration of wrist 03/10/2018  . Leg hematoma 12/25/2017  . Suicide and self-inflicted injury by cutting and piercing instrument ([place]) 03/10/2017  . Severe recurrent major 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 ([place]) 03/09/2017  . Substance induced mood disorder ([place]) 08/15/2016  . Involuntary commitment 08/15/2016  . Alcohol use disorder, severe, dependence ([place]) 02/05/2016  . Hypertension 12/05/2015  . Tachycardia 12/05/2015  . Gout 11/13/2015  . Chronic back pain 05/02/2015    Past Surgical History:  Procedure Laterality Date  . CHOLECYSTECTOMY  2012  . EXTRACORPOREAL SHOCK WAVE LITHOTRIPSY Left 01/12/2019   Procedure: EXTRACORPOREAL SHOCK WAVE LITHOTRIPSY (ESWL);  Surgeon: Billey Co, MD;  Location: [facility] ORS;  Service: Urology;  Laterality: Left;    Prior to Admission medications   Medication Sig Start Date End Date Taking? Authorizing Provider  albuterol (VENTOLIN HFA) 108 (90 Base) MCG/ACT inhaler Inhale 2 puffs into the lungs every 4 (four) hours as needed. Patient taking differently: Inhale 2 puffs into the lungs every 4 (four) hours as needed for wheezing or shortness of breath.  09/01/18   Tukov-Yual, Arlyss Gandy, NP  allopurinol (ZYLOPRIM) 300 MG tablet Take 1 tablet (300 mg total) by mouth daily. 09/01/18   Tukov-Yual, Arlyss Gandy, NP  colchicine 0.6 MG tablet Take 1 tablet (0.6 mg total) by mouth daily. Patient  taking differently: Take 0.6 mg by mouth daily as needed.  03/09/19   Lang Snow, NP  diclofenac sodium (VOLTAREN) 1 % GEL Apply 2 g topically 4 (four) times daily. 06/14/19   Iloabachie, Chioma E, NP  docusate sodium (COLACE) 100 MG capsule Take 1 tablet once or twice daily as needed for constipation while taking narcotic pain medicine 01/09/19   Hinda Kehr, MD  Fluticasone-Salmeterol (ADVAIR DISKUS) 100-50 MCG/DOSE AEPB Inhale 1 puff into the lungs 2 (two) times daily. 09/01/18    Tukov-Yual, Arlyss Gandy, NP  folic acid (FOLVITE) 1 MG tablet Take 1 tablet (1 mg total) by mouth daily. 09/01/18   Tukov-Yual, Arlyss Gandy, NP  gabapentin (NEURONTIN) 100 MG capsule Take 2 capsules (200 mg total) by mouth 3 (three) times daily. 04/21/19   Patrecia Pour, NP  lisinopril (PRINIVIL,ZESTRIL) 20 MG tablet Take 1 tablet (20 mg total) by mouth daily. 02/21/19   Iloabachie, Chioma E, NP  methocarbamol (ROBAXIN) 500 MG tablet Take 1 tablet (500 mg total) by mouth 4 (four) times daily. 07/12/19   Duanne Guess, PA-C  Multiple Vitamins-Minerals (MULTIVITAMIN WITH MINERALS) tablet Take 1 tablet by mouth daily. 09/01/18   Tukov-Yual, Arlyss Gandy, NP  promethazine (PHENERGAN) 12.5 MG tablet Take 1 tablet (12.5 mg total) by mouth every 6 (six) hours as needed. 05/17/19   Merlyn Lot, MD  tiZANidine (ZANAFLEX) 4 MG tablet Take 0.5 tablets (2 mg total) by mouth every 8 (eight) hours as needed for muscle spasms. 04/26/19   Iloabachie, Chioma E, NP    Allergies Percocet [oxycodone-acetaminophen]  Family History  Problem Relation Age of Onset  . Alcohol abuse Father   . Breast cancer Mother 34    Social History Social History   Tobacco Use  . Smoking status: Former Research scientist (life sciences)  . Smokeless tobacco: Never Used  . Tobacco comment: quit 30 years ago  Substance Use Topics  . Alcohol use: Yes    Alcohol/week: 2.0 standard drinks    Types: 2 Glasses of wine per week    Comment: daily  . Drug use: No      Review of Systems Constitutional: No fever/chills Eyes: No visual changes. ENT: No sore throat. Cardiovascular: Denies chest pain. Respiratory: Denies shortness of breath. Gastrointestinal: Abdominal pain no nausea, no vomiting.  No diarrhea.  No constipation. Genitourinary: Negative for dysuria.  Positive hematuria Musculoskeletal: Negative for back pain. Skin: Negative for rash. Neurological: Negative for headaches, focal weakness or numbness. +tremors All other ROS negative  ____________________________________________   PHYSICAL EXAM:  VITAL SIGNS: ED Triage Vitals  Enc Vitals Group     BP 07/19/19 0109 (!) 164/89     Pulse Rate 07/19/19 0109 (!) 102     Resp 07/19/19 0109 20     Temp 07/19/19 0109 98 F (36.7 C)     Temp Source 07/19/19 0109 Oral     SpO2 07/19/19 0109 100 %     Weight 07/19/19 0108 184 lb 15.5 oz (83.9 kg)     Height 07/19/19 0108 6' (1.829 m)     Head Circumference --      Peak Flow --      Pain Score 07/19/19 0108 8     Pain Loc --      Pain Edu? --      Excl. in [place]? --     Constitutional: Alert and oriented. Well appearing and in no acute distress. Eyes: Conjunctivae are normal. EOMI. Head: Atraumatic. Nose: No congestion/rhinnorhea. Mouth/Throat: Mucous  membranes are moist.   Neck: No stridor. Trachea Midline. FROM Cardiovascular:tachy, regular rhythm. Grossly normal heart sounds.  Good peripheral circulation. Respiratory: Normal respiratory effort.  No retractions. Lungs CTAB. Gastrointestinal: Soft and nontender. No distention. No abdominal bruits. Old cuts on his abd from prior visit. Musculoskeletal: No lower extremity tenderness nor edema.  No joint effusions. Neurologic:  Normal speech and language. No gross focal neurologic deficits are appreciated.  Mild tremors Skin:  Skin is warm, dry and intact. No rash noted. Psychiatric: Mood and affect are normal. Speech and behavior are normal. GU: Deferred   ____________________________________________   LABS (all labs ordered are listed, but only abnormal results are displayed)  Labs Reviewed  CBC WITH DIFFERENTIAL/PLATELET - Abnormal; Notable for the following components:      Result Value   Hemoglobin 12.7 (*)    HCT 38.5 (*)    RDW 16.1 (*)    All other components within normal limits  COMPREHENSIVE METABOLIC PANEL - Abnormal; Notable for the following components:   Potassium 3.3 (*)    Glucose, Bld 143 (*)    AST 14 (*)    All other components within  normal limits  LIPASE, BLOOD - Abnormal; Notable for the following components:   Lipase 52 (*)    All other components within normal limits  URINALYSIS, COMPLETE (UACMP) WITH MICROSCOPIC - Abnormal; Notable for the following components:   Color, Urine AMBER (*)    APPearance CLOUDY (*)    Hgb urine dipstick LARGE (*)    Bilirubin Urine SMALL (*)    Ketones, ur 5 (*)    Protein, ur 100 (*)    Leukocytes,Ua SMALL (*)    RBC / HPF >50 (*)    All other components within normal limits  URINE CULTURE  ETHANOL  TROPONIN I (HIGH SENSITIVITY)   ____________________________________________   ED ECG REPORT I, Vanessa , the attending physician, personally viewed and interpreted this ECG.  EKG is sinus tachycardia rate of 109, no ST elevation, no T wave inversion, normal intervals ____________________________________________  RADIOLOGY  Official radiology report(s): Ct Renal Stone Study  Result Date: 07/19/2019 CLINICAL DATA:  Hematuria, unknown cause EXAM: CT ABDOMEN AND PELVIS WITHOUT CONTRAST TECHNIQUE: Multidetector CT imaging of the abdomen and pelvis was performed following the standard protocol without IV contrast. COMPARISON:  CT abdomen pelvis 06/03/2019 FINDINGS: Lower chest: Lung bases are clear. Normal heart size. No pericardial effusion. Hepatobiliary: No focal liver abnormality. Gallbladder is not visualized, likely surgically absent. No biliary ductal dilatation. No calcified intraductal gallstones Pancreas: Partial fatty replacement of the pancreas. No visible pancreatic lesions or ductal dilatation. Spleen: Normal in size without focal abnormality. Adrenals/Urinary Tract: Normal adrenal glands. Mild bilateral nonspecific perinephric stranding, a nonspecific finding though may correlate with either age or decreased renal function. Bilateral nonobstructing nephrolithiasis with multiple punctate renal calculi in both kidney. No visible or contour deforming renal lesions. Ureters  are normal course and caliber. There is mild bladder wall thickening though this may be related to underdistention. Stomach/Bowel: Distal esophagus and stomach are unremarkable. Few to air-filled duodenal diverticula. No small bowel dilatation or wall thickening. A normal appendix is visualized. No colonic dilatation or wall thickening. Scattered colonic diverticula without focal pericolonic inflammation to suggest diverticulitis. Vascular/Lymphatic: Atherosclerotic plaque within the normal caliber aorta. No suspicious or enlarged lymph nodes in the included lymphatic chains. Reproductive: Mild prostatomegaly with median lobe hypertrophy. No abnormality of the seminal vesicles. Other: No abdominopelvic free fluid or free gas. No bowel  containing hernias. Small fat containing umbilical hernia. Musculoskeletal: Multilevel degenerative changes are present in the imaged portions of the spine. No acute osseous abnormality or suspicious osseous lesion. IMPRESSION: 1. Bilateral nonobstructing nephrolithiasis. No obstructive urolithiasis or hydronephrosis. 2. Mild bilateral nonspecific perinephric stranding, a nonspecific finding though may correlate with either age or decreased renal function. Similar to comparison. 3. Mild prostatomegaly with median hypertrophy. 4. Mild circumferential bladder wall thickening, possibly related to underdistention though should correlate with urinalysis to exclude cystitis. 5. Aortic Atherosclerosis (ICD10-I70.0). Electronically Signed   By: Lovena Le M.D.   On: 07/19/2019 06:36    ____________________________________________   PROCEDURES  Procedure(s) performed (including Critical Care):  Procedures   ____________________________________________   INITIAL IMPRESSION / ASSESSMENT AND PLAN / ED COURSE  Miguel [name] was evaluated in Emergency Department on 07/19/2019 for the symptoms described in the history of present illness. He was evaluated in the context of the global  COVID-19 pandemic, which necessitated consideration that the patient might be at risk for infection with the SARS-CoV-2 virus that causes COVID-19. Institutional protocols and algorithms that pertain to the evaluation of patients at risk for COVID-19 are in a state of rapid change based on information released by regulatory bodies including the CDC and federal and state organizations. These policies and algorithms were followed during the patient's care in the ED.     Pt presents with concern for his withdraw as well as some blood in urine with abd pain.  Will give ativan for tachycardia, HTN, mild tremors.  Urine is now back to clear so low suspicion for retention. Will get CT to rule out kidney stone. Will get UA to evaluate for UTI.  Prior CT w/ contrast normal Aorta.   UA with greater than 50 RBCs.  Does have calcium oxalate crystals present.  Hemoglobin is around patient's baseline at 12.7.  Kidney function is normal.  Lipase slightly elevated at 52.  Cardiac marker negative.  Ethanol level negative.   CT negative  Revaluated pt and withdraw symptoms much better. Pt would like to go home.  Given Antibiotics for UTI and given librium taper. Instructed not to drink while using this.      ____________________________________________   FINAL CLINICAL IMPRESSION(S) / ED DIAGNOSES   Final diagnoses:  Cystitis  Alcohol withdrawal syndrome without complication (HCC)      MEDICATIONS GIVEN DURING THIS VISIT:  Medications  LORazepam (ATIVAN) injection 1 mg (1 mg Intravenous Given 07/19/19 0549)     ED Discharge Orders         Ordered    cefUROXime (CEFTIN) 250 MG tablet  2 times daily,   Status:  Discontinued     07/19/19 0725    chlordiazePOXIDE (LIBRIUM) 25 MG capsule  3 times daily PRN     07/19/19 0725    cefUROXime (CEFTIN) 250 MG tablet  2 times daily     07/19/19 [id number]           Note:  This document was prepared using Dragon voice recognition software and may include  unintentional dictation errors.   Vanessa Dolton, MD 07/19/19 [phone]

## 2019-07-19 NOTE — ED Notes (Signed)
Patient transported to CT 

## 2019-07-19 NOTE — Discharge Instructions (Addendum)
We are treating you for a UTI given the blood in your urine.  If you develop this again you should follow-up with urology.  We are also giving you a taper for alcohol withdrawal  FOR THE Librium taper:  Day 1: 50mg  every 6 hours Day 2: 25mg  every 6 hours Day 3: 25mg  every 12 hours  Day 4: 25mg  at night (Rx fifteen 25mg  tabs)  Return to ER for seizures, worsening withdraw, vomiting, fevers or any other concerns  1. Bilateral nonobstructing nephrolithiasis. No obstructive urolithiasis or hydronephrosis. 2. Mild bilateral nonspecific perinephric stranding, a nonspecific finding though may correlate with either age or decreased renal function. Similar to comparison. 3. Mild prostatomegaly with median hypertrophy. 4. Mild circumferential bladder wall thickening, possibly related to underdistention though should correlate with urinalysis to exclude cystitis.

## 2019-07-19 NOTE — ED Notes (Signed)
Lab results reviewed. Awaiting room for MD eval. Patient visualized in lobby in no acute distress.

## 2019-07-19 NOTE — ED Triage Notes (Signed)
Pt to triage via w/c with no distress noted, mask in place, brought in by EMS; pt reports mid upper abd pain tonight accomp by some blood noted in urine and when wiping after a BM

## 2019-07-20 LAB — URINE CULTURE
Culture: NO GROWTH
Special Requests: NORMAL

## 2019-07-24 ENCOUNTER — Encounter: Payer: Self-pay | Admitting: Emergency Medicine

## 2019-07-24 ENCOUNTER — Other Ambulatory Visit: Payer: Self-pay

## 2019-07-24 ENCOUNTER — Emergency Department
Admission: EM | Admit: 2019-07-24 | Discharge: 2019-07-25 | Disposition: A | Discharge status: Home / Self Care | Payer: Medicaid Other | Attending: Emergency Medicine | Admitting: Emergency Medicine

## 2019-07-24 DIAGNOSIS — F32A Depression, unspecified: Secondary | ICD-10-CM

## 2019-07-24 DIAGNOSIS — Z7289 Other problems related to lifestyle: Secondary | ICD-10-CM

## 2019-07-24 DIAGNOSIS — F329 Major depressive disorder, single episode, unspecified: Secondary | ICD-10-CM | POA: Insufficient documentation

## 2019-07-24 DIAGNOSIS — F10120 Alcohol abuse with intoxication, uncomplicated: Secondary | ICD-10-CM | POA: Insufficient documentation

## 2019-07-24 DIAGNOSIS — Z79899 Other long term (current) drug therapy: Secondary | ICD-10-CM | POA: Insufficient documentation

## 2019-07-24 DIAGNOSIS — Y906 Blood alcohol level of 120-199 mg/100 ml: Secondary | ICD-10-CM | POA: Insufficient documentation

## 2019-07-24 DIAGNOSIS — J45909 Unspecified asthma, uncomplicated: Secondary | ICD-10-CM | POA: Insufficient documentation

## 2019-07-24 DIAGNOSIS — F1914 Other psychoactive substance abuse with psychoactive substance-induced mood disorder: Secondary | ICD-10-CM | POA: Insufficient documentation

## 2019-07-24 DIAGNOSIS — I10 Essential (primary) hypertension: Secondary | ICD-10-CM | POA: Insufficient documentation

## 2019-07-24 DIAGNOSIS — F10920 Alcohol use, unspecified with intoxication, uncomplicated: Secondary | ICD-10-CM

## 2019-07-24 DIAGNOSIS — IMO0002 Reserved for concepts with insufficient information to code with codable children: Secondary | ICD-10-CM

## 2019-07-24 LAB — CBC
HCT: 39.8 % (ref 39.0–52.0)
Hemoglobin: 12.7 g/dL — ABNORMAL LOW (ref 13.0–17.0)
MCH: 28 pg (ref 26.0–34.0)
MCHC: 31.9 g/dL (ref 30.0–36.0)
MCV: 87.9 fL (ref 80.0–100.0)
Platelets: 197 10*3/uL (ref 150–400)
RBC: 4.53 MIL/uL (ref 4.22–5.81)
RDW: 17.1 % — ABNORMAL HIGH (ref 11.5–15.5)
WBC: 6 10*3/uL (ref 4.0–10.5)
nRBC: 0 % (ref 0.0–0.2)

## 2019-07-24 LAB — COMPREHENSIVE METABOLIC PANEL
ALT: 16 U/L (ref 0–44)
AST: 18 U/L (ref 15–41)
Albumin: 3.8 g/dL (ref 3.5–5.0)
Alkaline Phosphatase: 63 U/L (ref 38–126)
Anion gap: 9 (ref 5–15)
BUN: 15 mg/dL (ref 6–20)
CO2: 21 mmol/L — ABNORMAL LOW (ref 22–32)
Calcium: 8.8 mg/dL — ABNORMAL LOW (ref 8.9–10.3)
Chloride: 112 mmol/L — ABNORMAL HIGH (ref 98–111)
Creatinine, Ser: 0.99 mg/dL (ref 0.61–1.24)
GFR calc Af Amer: >60 mL/min (ref >60)
GFR calc non Af Amer: >60 mL/min (ref >60)
Glucose, Bld: 113 mg/dL — ABNORMAL HIGH (ref 70–99)
Potassium: 3.5 mmol/L (ref 3.5–5.1)
Sodium: 142 mmol/L (ref 135–145)
Total Bilirubin: 0.4 mg/dL (ref 0.3–1.2)
Total Protein: 6.6 g/dL (ref 6.5–8.1)

## 2019-07-24 LAB — URINE DRUG SCREEN, QUALITATIVE (ARMC ONLY)
Amphetamines, Ur Screen: NOT DETECTED
Barbiturates, Ur Screen: NOT DETECTED
Benzodiazepine, Ur Scrn: POSITIVE — AB
Cannabinoid 50 Ng, Ur ~~LOC~~: POSITIVE — AB
Cocaine Metabolite,Ur ~~LOC~~: NOT DETECTED
MDMA (Ecstasy)Ur Screen: NOT DETECTED
Methadone Scn, Ur: NOT DETECTED
Opiate, Ur Screen: NOT DETECTED
Phencyclidine (PCP) Ur S: NOT DETECTED
Tricyclic, Ur Screen: NOT DETECTED

## 2019-07-24 LAB — ETHANOL: Alcohol, Ethyl (B): 175 mg/dL — ABNORMAL HIGH (ref <10)

## 2019-07-24 MED ORDER — LORAZEPAM 0.5 MG PO TABS
0.5000 mg | ORAL_TABLET | Freq: Once | ORAL | Status: AC
  Administered 2019-07-24: 0.5 mg via ORAL
  Filled 2019-07-24: qty 1

## 2019-07-24 MED ORDER — LORAZEPAM 2 MG PO TABS
2.0000 mg | ORAL_TABLET | Freq: Once | ORAL | Status: AC
  Administered 2019-07-24: 2 mg via ORAL
  Filled 2019-07-24: qty 1
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
## 2019-07-24 NOTE — ED Triage Notes (Signed)
Arrives with BPD under IVC for ED evaluation.  Patient states "I would never kill myself or kill or hurt anyone else.  I just get upset with myself".

## 2019-07-24 NOTE — ED Notes (Signed)
Report to psychiatrist

## 2019-07-24 NOTE — ED Triage Notes (Signed)
FIRST NURSE NOTE- in chairs in triage area with BPD. IVC.

## 2019-07-24 NOTE — ED Notes (Signed)
TTS machine brought into pt room and pt speaking with TTs

## 2019-07-24 NOTE — ED Notes (Signed)
Brown shoes Jeans [employer] boxers [employer] just cards and license  [employer]

## 2019-07-24 NOTE — ED Notes (Signed)
ED Provider at bedside. 

## 2019-07-24 NOTE — ED Notes (Signed)
[place] cart to this RN's desk. Pt updated to plan of care
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
## 2019-07-25 NOTE — Discharge Instructions (Addendum)
Take medicines as directed by your doctor daily.  Return to the ER for worsening symptoms, feelings of hurting yourself or others, or other concerns.

## 2019-07-25 NOTE — ED Notes (Signed)
PT sent home with papers siting additional resources for outpatient substance abuse services as well as a paper with a list of therapists.

## 2019-07-25 NOTE — ED Notes (Signed)
Belongings returned to PT and Tax[employer] called

## 2019-07-25 NOTE — ED Provider Notes (Signed)
-----------------------------------------   12:25 AM on 07/25/2019 -----------------------------------------   Patient was evaluated by [facility] psychiatrist Dr. Lavon Paganini who deems him psychiatrically stable for discharge home and has rescinded his IVC.No new medication recommendations. Strict return precautions given. Patient verbalizes understanding and agrees with plan of care.     Paulette Blanch, MD 07/25/19 [phone]

## 2019-07-26 ENCOUNTER — Other Ambulatory Visit: Payer: Self-pay

## 2019-07-26 ENCOUNTER — Emergency Department
Admission: EM | Admit: 2019-07-26 | Discharge: 2019-07-27 | Disposition: A | Discharge status: Other Acute Inpatient Hospital | Payer: Medicaid Other | Attending: Emergency Medicine | Admitting: Emergency Medicine

## 2019-07-26 DIAGNOSIS — F10120 Alcohol abuse with intoxication, uncomplicated: Secondary | ICD-10-CM | POA: Insufficient documentation

## 2019-07-26 DIAGNOSIS — Y907 Blood alcohol level of 200-239 mg/100 ml: Secondary | ICD-10-CM | POA: Insufficient documentation

## 2019-07-26 DIAGNOSIS — I10 Essential (primary) hypertension: Secondary | ICD-10-CM | POA: Insufficient documentation

## 2019-07-26 DIAGNOSIS — X781XXA Intentional self-harm by knife, initial encounter: Secondary | ICD-10-CM | POA: Insufficient documentation

## 2019-07-26 DIAGNOSIS — F332 Major depressive disorder, recurrent severe without psychotic features: Secondary | ICD-10-CM | POA: Insufficient documentation

## 2019-07-26 DIAGNOSIS — F1020 Alcohol dependence, uncomplicated: Secondary | ICD-10-CM | POA: Diagnosis present

## 2019-07-26 DIAGNOSIS — Y999 Unspecified external cause status: Secondary | ICD-10-CM | POA: Insufficient documentation

## 2019-07-26 DIAGNOSIS — S51812A Laceration without foreign body of left forearm, initial encounter: Secondary | ICD-10-CM

## 2019-07-26 DIAGNOSIS — Y92010 Kitchen of single-family (private) house as the place of occurrence of the external cause: Secondary | ICD-10-CM | POA: Insufficient documentation

## 2019-07-26 DIAGNOSIS — F10220 Alcohol dependence with intoxication, uncomplicated: Secondary | ICD-10-CM | POA: Diagnosis present

## 2019-07-26 DIAGNOSIS — Z20828 Contact with and (suspected) exposure to other viral communicable diseases: Secondary | ICD-10-CM | POA: Insufficient documentation

## 2019-07-26 DIAGNOSIS — J45909 Unspecified asthma, uncomplicated: Secondary | ICD-10-CM | POA: Insufficient documentation

## 2019-07-26 DIAGNOSIS — Z87891 Personal history of nicotine dependence: Secondary | ICD-10-CM | POA: Insufficient documentation

## 2019-07-26 DIAGNOSIS — R45851 Suicidal ideations: Secondary | ICD-10-CM

## 2019-07-26 DIAGNOSIS — Y9389 Activity, other specified: Secondary | ICD-10-CM | POA: Insufficient documentation

## 2019-07-26 LAB — URINE DRUG SCREEN, QUALITATIVE (ARMC ONLY)
Amphetamines, Ur Screen: NOT DETECTED
Barbiturates, Ur Screen: NOT DETECTED
Benzodiazepine, Ur Scrn: POSITIVE — AB
Cannabinoid 50 Ng, Ur ~~LOC~~: POSITIVE — AB
Cocaine Metabolite,Ur ~~LOC~~: NOT DETECTED
MDMA (Ecstasy)Ur Screen: NOT DETECTED
Methadone Scn, Ur: NOT DETECTED
Opiate, Ur Screen: NOT DETECTED
Phencyclidine (PCP) Ur S: NOT DETECTED
Tricyclic, Ur Screen: NOT DETECTED

## 2019-07-26 LAB — CBC
HCT: 38 % — ABNORMAL LOW (ref 39.0–52.0)
Hemoglobin: 12.1 g/dL — ABNORMAL LOW (ref 13.0–17.0)
MCH: 27.9 pg (ref 26.0–34.0)
MCHC: 31.8 g/dL (ref 30.0–36.0)
MCV: 87.8 fL (ref 80.0–100.0)
Platelets: 241 10*3/uL (ref 150–400)
RBC: 4.33 MIL/uL (ref 4.22–5.81)
RDW: 17.3 % — ABNORMAL HIGH (ref 11.5–15.5)
WBC: 6.8 10*3/uL (ref 4.0–10.5)
nRBC: 0 % (ref 0.0–0.2)

## 2019-07-26 LAB — COMPREHENSIVE METABOLIC PANEL
ALT: 16 U/L (ref 0–44)
AST: 14 U/L — ABNORMAL LOW (ref 15–41)
Albumin: 3.6 g/dL (ref 3.5–5.0)
Alkaline Phosphatase: 68 U/L (ref 38–126)
Anion gap: 10 (ref 5–15)
BUN: 13 mg/dL (ref 6–20)
CO2: 24 mmol/L (ref 22–32)
Calcium: 8.6 mg/dL — ABNORMAL LOW (ref 8.9–10.3)
Chloride: 107 mmol/L (ref 98–111)
Creatinine, Ser: 1.07 mg/dL (ref 0.61–1.24)
GFR calc Af Amer: >60 mL/min (ref >60)
GFR calc non Af Amer: >60 mL/min (ref >60)
Glucose, Bld: 114 mg/dL — ABNORMAL HIGH (ref 70–99)
Potassium: 3.6 mmol/L (ref 3.5–5.1)
Sodium: 141 mmol/L (ref 135–145)
Total Bilirubin: 0.8 mg/dL (ref 0.3–1.2)
Total Protein: 6.3 g/dL — ABNORMAL LOW (ref 6.5–8.1)

## 2019-07-26 LAB — ACETAMINOPHEN LEVEL: Acetaminophen (Tylenol), Serum: <10 ug/mL — ABNORMAL LOW (ref 10–30)

## 2019-07-26 LAB — ETHANOL: Alcohol, Ethyl (B): 222 mg/dL — ABNORMAL HIGH (ref <10)

## 2019-07-26 LAB — SALICYLATE LEVEL: Salicylate Lvl: <7 mg/dL (ref 2.8–30.0)

## 2019-07-26 MED ORDER — MORPHINE SULFATE (PF) 4 MG/ML IV SOLN
4.0000 mg | Freq: Once | INTRAVENOUS | Status: AC
  Administered 2019-07-26: 4 mg via INTRAVENOUS
  Filled 2019-07-26: qty 1

## 2019-07-26 MED ORDER — IBUPROFEN 400 MG PO TABS
400.0000 mg | ORAL_TABLET | Freq: Once | ORAL | Status: AC
  Administered 2019-07-26: 400 mg via ORAL
  Filled 2019-07-26: qty 1

## 2019-07-26 NOTE — ED Notes (Signed)
Gave food tray with juice. 

## 2019-07-26 NOTE — ED Notes (Signed)
IVC, pending psych consult 

## 2019-07-26 NOTE — ED Notes (Signed)
Patient states he is having SI with wanting to get Covid and wanting to die in this hospital.

## 2019-07-26 NOTE — ED Notes (Signed)
ED  Is the patient under IVC or is there intent for IVC: Yes.   Is the patient medically cleared: Yes.   Is there vacancy in the ED BHU: Yes.   Is the population mix appropriate for patient:   Laceration wrapped in kerlex - pt not cleared to move to BHU  Is the patient awaiting placement in inpatient or outpatient setting: Yes.   Has the patient had a psychiatric consult: Yes.   Survey of unit performed for contraband, proper placement and condition of furniture, tampering with fixtures in bathroom, shower, and each patient room: Yes.  ; Findings:  APPEARANCE/BEHAVIOR Calm and cooperative NEURO ASSESSMENT Orientation: oriented x3  Denies pain Hallucinations: No.None noted (Hallucinations)  Denies  Speech: Normal Gait: normal RESPIRATORY ASSESSMENT Even  Unlabored respirations  CARDIOVASCULAR ASSESSMENT Pulses equal   regular rate  Skin warm and dry   GASTROINTESTINAL ASSESSMENT no GI complaint EXTREMITIES Full ROM  PLAN OF CARE Provide calm/safe environment. Vital signs assessed twice daily. ED BHU Assessment once each 12-hour shift. Collaborate with TTS daily or as condition indicates. Assure the ED provider has rounded once each shift. Provide and encourage hygiene. Provide redirection as needed. Assess for escalating behavior; address immediately and inform ED provider.  Assess family dynamic and appropriateness for visitation as needed: Yes.  ; If necessary, describe findings:  Educate the patient/family about BHU procedures/visitation: Yes.  ; If necessary, describe findings:

## 2019-07-26 NOTE — ED Notes (Signed)
Pt c/o left fa pain and request pain medication - Dr Charna Archer aware

## 2019-07-26 NOTE — ED Notes (Addendum)
Pt resting comfortably in bed

## 2019-07-26 NOTE — ED Notes (Signed)
IV removed from patients rt. A/C, site is clean.  Pt. Has bandage to lt. Forearm from self inflected wounds.

## 2019-07-26 NOTE — ED Notes (Signed)
ED tech Melanie provided pt with food for dinner.
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
## 2019-07-26 NOTE — ED Notes (Signed)
Sandwich tray given with apple juice.

## 2019-07-26 NOTE — ED Notes (Signed)
Per Dr Jimmye Norman dressing to left fa changed and officers in room to photograph wound - bleeding is controlled and not active at this time - dressing reapplied and ice pack placed on left fa

## 2019-07-26 NOTE — ED Notes (Signed)
Introduced patient to unit.  Pt. Advised of cameras, safety checks and locked bathroom.  Pt. Calm and cooperative at this time.

## 2019-07-26 NOTE — Consult Note (Signed)
[facility] Face-to-Face Psychiatry Consult   Reason for Consult:  Self inflicted injury Referring Physician:  EDP Patient Identification: Miguel [name] MRN:  [id number] Principal Diagnosis: Severe recurrent major 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 ([place]) Diagnosis:  Principal Problem:   Severe recurrent major 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 ([place]) Active Problems:   Alcohol use disorder, severe, dependence ([place])   Total Time spent with patient: 30 minutes  Subjective:   Miguel [name] is a 58 y.o. male patient reports that he was brought to the hospital by the police due to cutting himself with a knife on his left forearm.  Patient reports that he had been drinking.  He states he does not drink in front of his wife because she usually argues at him over it.  He states he goes to his car and listens to his music and drinks.  He states that he has been trying to cut down because he is wanting to quit and has been drinking about 1 miniature bottle of liquor a day.  However, today he states that he drank 3.  He states he does not really remember what the argument with his wife was about today but states it was probably about his drinking.  He states that he walked out of the house and grabbed a knife when he left the house and when he got outside he cut his arm.  He states that he was not trying to kill himself and that he would never do that in front of her.  He states he does not drink in front of her because she has MS and lymphedema and receives home health care to assist with her baths.  He states that he has to be there to help take care of her on a daily basis because he is afraid that she will fall.  Patient again endorses that he is not suicidal.  He reports that he used to work as an Cabin crew and still has all the tools and will work on cars in his yard.  He states that he is not employed currently and him and his wife live off of $800 a month that she gets for disability.  He states that he  does get food stamps but she yells and argues with him about him spending any Ottie Neglia even if it is to keep their car going.  He states he currently does not have a psychiatrist but has [facility] because of him possibly having bipolar disorder.  Patient denies any homicidal ideations and denies any hallucinations.  He states that he does want to quit drinking and he does need help with that.  Patient also reports feeling depressed due to not having any friends.  He states that his friends do not come over because all of them drink and he has been trying to stay away from that and his wife does not want him around.  Attempted to contact patient's wife, [name] for collateral and a voicemail was left.  HPI:  Per Dr. Jimmye Norman: 58 y.o. male with a history of alcohol abuse, asthma, GERD, gout, hypertension, obsessive-compulsive disorder who presents to the ED for reports that he stabbed himself in the left arm with a kitchen knife.  He was brought in by EMS for same.  He admits to drinking alcohol today, states he does not want to harm himself.  Patient is seen by this provider face-to-face.  Patient is depressed and tearful during evaluation.  He seems very concerned about his wife.  He is in agreement to seek treatment so that he can be stabilized so he can return home and take care of her and hopefully do it sober.  Patient was intoxicated and impulsively grabbed a knife and self-inflicted a laceration to the left arm that he reports having to have sutures for.  At this time feel that the patient meets inpatient criteria due to impulsivity that that ended and self-inflicted laceration to his left forearm as well as his intoxication.  Past Psychiatric History: history multiple self inflicted wounds due to depression and intoxication, alcohol abuse  Risk to Self:   Risk to Others:   Prior Inpatient Therapy:   Prior Outpatient  Therapy:    Past Medical History:  Past Medical History:  Diagnosis Date  . Alcohol abuse   . Asthma   . GERD (gastroesophageal reflux disease)   . Gout   . Hypertension   . Kidney stone   . OCD (obsessive compulsive disorder)   . Renal colic     Past Surgical History:  Procedure Laterality Date  . CHOLECYSTECTOMY  2012  . EXTRACORPOREAL SHOCK WAVE LITHOTRIPSY Left 01/12/2019   Procedure: EXTRACORPOREAL SHOCK WAVE LITHOTRIPSY (ESWL);  Surgeon: Billey Co, MD;  Location: [facility] ORS;  Service: Urology;  Laterality: Left;   Family History:  Family History  Problem Relation Age of Onset  . Alcohol abuse Father   . Breast cancer Mother 73   Family Psychiatric  History: Father- alcohol abuse Social History:  Social History   Substance and Sexual Activity  Alcohol Use Yes  . Alcohol/week: 2.0 standard drinks  . Types: 2 Glasses of wine per week   Comment: daily     Social History   Substance and Sexual Activity  Drug Use No    Social History   Socioeconomic History  . Marital status: Married    Spouse name: Not on file  . Number of children: Not on file  . Years of education: Not on file  . Highest education level: Not on file  Occupational History  . Not on file  Social Needs  . Financial resource strain: Very hard  . Food insecurity    Worry: Never true    Inability: Never true  . Transportation needs    Medical: No    Non-medical: No  Tobacco Use  . Smoking status: Former Research scientist (life sciences)  . Smokeless tobacco: Never Used  . Tobacco comment: quit 30 years ago  Substance and Sexual Activity  . Alcohol use: Yes    Alcohol/week: 2.0 standard drinks    Types: 2 Glasses of wine per week    Comment: daily  . Drug use: No  . Sexual activity: Yes  Lifestyle  . Physical activity    Days per week: 2 days    Minutes per session: 10 min  . Stress: Very much  Relationships  . Social Herbalist on phone: Once a week    Gets together: Not on file     Attends religious service: Never    Active member of club or organization: No    Attends meetings of clubs or organizations: Never    Relationship status: Married  Other Topics Concern  . Not on file  Social History Narrative   Lives at home with his wife and takes care of his wife. Independent at baseline      - Biggest strain  is financial but doesn't think they could got get more help.      Patient expressed interest in possible financial assistance. Informed written consent obtained   Additional Social History:    Allergies:   Allergies  Allergen Reactions  . Percocet [Oxycodone-Acetaminophen] Nausea And Vomiting    Labs:  Results for orders placed or performed during the hospital encounter of 07/26/19 (from the past 48 hour(s))  Comprehensive metabolic panel     Status: Abnormal   Collection Time: 07/26/19  1:56 PM  Result Value Ref Range   Sodium 141 135 - 145 mmol/L   Potassium 3.6 3.5 - 5.1 mmol/L   Chloride 107 98 - 111 mmol/L   CO2 24 22 - 32 mmol/L   Glucose, Bld 114 (H) 70 - 99 mg/dL   BUN 13 6 - 20 mg/dL   Creatinine, Ser 1.07 0.61 - 1.24 mg/dL   Calcium 8.6 (L) 8.9 - 10.3 mg/dL   Total Protein 6.3 (L) 6.5 - 8.1 g/dL   Albumin 3.6 3.5 - 5.0 g/dL   AST 14 (L) 15 - 41 U/L   ALT 16 0 - 44 U/L   Alkaline Phosphatase 68 38 - 126 U/L   Total Bilirubin 0.8 0.3 - 1.2 mg/dL   GFR calc non Af Amer >60 >60 mL/min   GFR calc Af Amer >60 >60 mL/min   Anion gap 10 5 - 15    Comment: Performed at [facility], [address] [place], [place] [address]  Ethanol     Status: Abnormal   Collection Time: 07/26/19  1:56 PM  Result Value Ref Range   Alcohol, Ethyl (B) 222 (H) <10 mg/dL    Comment: (NOTE) Lowest detectable limit for serum alcohol is 10 mg/dL. For medical purposes only. Performed at [facility], [address] [place], [place] [id number]   Salicylate level     Status: None   Collection Time: 07/26/19  1:56 PM  Result Value Ref Range    Salicylate Lvl [id number] 2.8 - 30.0 mg/dL    Comment: Performed at [facility], [address] [place], [place] [address]  Acetaminophen level     Status: Abnormal   Collection Time: 07/26/19  1:56 PM  Result Value Ref Range   Acetaminophen (Tylenol), Serum <10 (L) 10 - 30 ug/mL    Comment: (NOTE) Therapeutic concentrations vary significantly. A range of 10-30 ug/mL  may be an effective concentration for many patients. However, some  are best treated at concentrations outside of this range. Acetaminophen concentrations >150 ug/mL at 4 hours after ingestion  and >50 ug/mL at 12 hours after ingestion are often associated with  toxic reactions. Performed at [facility], [address] [place], [place] [address]   cbc     Status: Abnormal   Collection Time: 07/26/19  1:56 PM  Result Value Ref Range   WBC 6.8 4.0 - 10.5 K/uL   RBC 4.33 4.22 - 5.81 MIL/uL   Hemoglobin 12.1 (L) 13.0 - 17.0 g/dL   HCT 38.0 (L) 39.0 - 52.0 %   MCV 87.8 80.0 - 100.0 fL   MCH 27.9 26.0 - 34.0 pg   MCHC 31.8 30.0 - 36.0 g/dL   RDW 17.3 (H) 11.5 - 15.5 %   Platelets 241 150 - 400 K/uL   nRBC 0.0 0.0 - 0.2 %    Comment: Performed at [facility], [address], [place], [place] [address]  Urine Drug Screen, Qualitative  Status: Abnormal   Collection Time: 07/26/19  2:24 PM  Result Value Ref Range   Tricyclic, Ur Screen NONE DETECTED NONE DETECTED   Amphetamines, Ur Screen NONE DETECTED NONE DETECTED   MDMA (Ecstasy)Ur Screen NONE DETECTED NONE DETECTED   Cocaine Metabolite,Ur [place] NONE DETECTED NONE DETECTED   Opiate, Ur Screen NONE DETECTED NONE DETECTED   Phencyclidine (PCP) Ur S NONE DETECTED NONE DETECTED   Cannabinoid 50 Ng, Ur [place] POSITIVE (A) NONE DETECTED   Barbiturates, Ur Screen NONE DETECTED NONE DETECTED   Benzodiazepine, Ur Scrn POSITIVE (A) NONE DETECTED   Methadone Scn, Ur NONE DETECTED NONE DETECTED    Comment: (NOTE) Tricyclics + metabolites, urine     Cutoff 1000 ng/mL Amphetamines + metabolites, urine  Cutoff 1000 ng/mL MDMA (Ecstasy), urine              Cutoff 500 ng/mL Cocaine Metabolite, urine          Cutoff 300 ng/mL Opiate + metabolites, urine        Cutoff 300 ng/mL Phencyclidine (PCP), urine         Cutoff 25 ng/mL Cannabinoid, urine                 Cutoff 50 ng/mL Barbiturates + metabolites, urine  Cutoff 200 ng/mL Benzodiazepine, urine              Cutoff 200 ng/mL Methadone, urine                   Cutoff 300 ng/mL The urine drug screen provides only a preliminary, unconfirmed analytical test result and should not be used for non-medical purposes. Clinical consideration and professional judgment should be applied to any positive drug screen result due to possible interfering substances. A more specific alternate chemical method must be used in order to obtain a confirmed analytical result. Gas chromatography / mass spectrometry (GC/MS) is the preferred confirmat ory method. Performed at [facility], [address] [place], [place] [address]     No current facility-administered medications for this encounter.    Current Outpatient Medications  Medication Sig Dispense Refill  . albuterol (VENTOLIN HFA) 108 (90 Base) MCG/ACT inhaler Inhale 2 puffs into the lungs every 4 (four) hours as needed. (Patient taking differently: Inhale 2 puffs into the lungs every 4 (four) hours as needed for wheezing or shortness of breath. ) 72 g 0  . allopurinol (ZYLOPRIM) 300 MG tablet Take 1 tablet (300 mg total) by mouth daily. 90 tablet 3  . cefUROXime (CEFTIN) 250 MG tablet Take 1 tablet (250 mg total) by mouth 2 (two) times daily for 10 days. 20 tablet 0  . chlordiazePOXIDE (LIBRIUM) 25 MG capsule Take 1 capsule (25 mg total) by mouth 3 (three) times daily as needed for withdrawal. Additional taper in discharge instructions. 15 capsule 0  . colchicine 0.6 MG tablet Take 1 tablet (0.6 mg total) by mouth daily. (Patient taking  differently: Take 0.6 mg by mouth daily as needed. ) 4 tablet 0  . diclofenac sodium (VOLTAREN) 1 % GEL Apply 2 g topically 4 (four) times daily. 50 g 0  . docusate sodium (COLACE) 100 MG capsule Take 1 tablet once or twice daily as needed for constipation while taking narcotic pain medicine 30 capsule 0  . Fluticasone-Salmeterol (ADVAIR DISKUS) 100-50 MCG/DOSE AEPB Inhale 1 puff into the lungs 2 (two) times daily. [id number] each 0  . folic acid (FOLVITE) 1 MG tablet Take 1 tablet (1 mg total)  by mouth daily. 90 tablet 3  . gabapentin (NEURONTIN) 100 MG capsule Take 2 capsules (200 mg total) by mouth 3 (three) times daily. 90 capsule 2  . lisinopril (PRINIVIL,ZESTRIL) 20 MG tablet Take 1 tablet (20 mg total) by mouth daily. 30 tablet 1  . methocarbamol (ROBAXIN) 500 MG tablet Take 1 tablet (500 mg total) by mouth 4 (four) times daily. 20 tablet 0  . Multiple Vitamins-Minerals (MULTIVITAMIN WITH MINERALS) tablet Take 1 tablet by mouth daily. 90 tablet 3  . promethazine (PHENERGAN) 12.5 MG tablet Take 1 tablet (12.5 mg total) by mouth every 6 (six) hours as needed. 12 tablet 0  . tiZANidine (ZANAFLEX) 4 MG tablet Take 0.5 tablets (2 mg total) by mouth every 8 (eight) hours as needed for muscle spasms. 30 tablet 1    Musculoskeletal: Strength & Muscle Tone: within normal limits Gait & Station: Patient remained in bed during evaluation Patient leans: N/A  Psychiatric Specialty Exam: Physical Exam  Nursing note and vitals reviewed. Constitutional: He is oriented to person, place, and time. He appears well-developed and well-nourished.  Cardiovascular: Normal rate.  Respiratory: Effort normal.  Musculoskeletal: Normal range of motion.  Neurological: He is alert and oriented to person, place, and time.  Skin: Skin is warm.    Review of Systems  Constitutional: Negative.   HENT: Negative.   Eyes: Negative.   Respiratory: Negative.   Cardiovascular: Negative.   Gastrointestinal: Negative.    Genitourinary: Negative.   Skin:       Self-inflicted laceration to left forearm  Neurological: Negative.   Endo/Heme/Allergies: Negative.   Psychiatric/Behavioral: Positive for depression and substance abuse.    Blood pressure (!) 153/88, pulse 86, temperature 98.7 F (37.1 C), temperature source Oral, resp. rate 17, height 6' (1.829 m), weight 86 kg, SpO2 97 %.Body mass index is 25.71 kg/m.  General Appearance: Disheveled  Eye Contact:  Minimal  Speech:  Clear and Coherent and Normal Rate  Volume:  Decreased  Mood:  Dysphoric  Affect:  Depressed and Tearful  Thought Process:  Coherent and Descriptions of Associations: Intact  Orientation:  Full (Time, Place, and Person)  Thought Content:  WDL  Suicidal Thoughts:  Continues to deny any SI  Homicidal Thoughts:  No  Memory:  Immediate;   Fair Recent;   Good Remote;   Good  Judgement:  Fair  Insight:  Lacking  Psychomotor Activity:  Normal  Concentration:  Concentration: Good  Recall:  Good  Fund of Knowledge:  Good  Language:  Good  Akathisia:  No  Handed:  Right  AIMS (if indicated):     Assets:  Communication Skills Desire for Improvement Housing Resilience Social Support Transportation  ADL's:  Intact  Cognition:  WNL  Sleep:        Treatment Plan Summary: Daily contact with patient to assess and evaluate symptoms and progress in treatment and Medication management  Disposition: Recommend psychiatric Inpatient admission when medically cleared.  [place], FNP 07/26/2019 6:14 PM

## 2019-07-26 NOTE — ED Notes (Addendum)
While attempting to dress out pt, pt was asking to speak to the doctor. MD Charna Archer made aware and came to bed side to speak with pt. Pt dressed out into the appropriate behavioral health clothing with this tech and Mitch,RN in the rm. Pt belongings consist of a black wallet, blue tennis shoes, white socks, blue jeans, blue boxers and a set of keys with two keys and a fob on the key ring. Pt cooperative while dressing out. Pt ambulated to rm 23. Pt belongings placed into one pt belongings bag and labeled with pt sticker. Melanie,EDT and Amy,RN aware of pt transferring to rm.

## 2019-07-27 ENCOUNTER — Telehealth: Payer: Self-pay | Admitting: Pharmacist

## 2019-07-27 ENCOUNTER — Inpatient Hospital Stay
Admission: AD | Admit: 2019-07-27 | Discharge: 2019-08-01 | DRG: 885 | Disposition: A | Discharge status: Home / Self Care | Payer: No Typology Code available for payment source | Attending: Psychiatry | Admitting: Psychiatry

## 2019-07-27 ENCOUNTER — Other Ambulatory Visit: Payer: Self-pay

## 2019-07-27 DIAGNOSIS — S51812A Laceration without foreign body of left forearm, initial encounter: Secondary | ICD-10-CM | POA: Diagnosis present

## 2019-07-27 DIAGNOSIS — Z87891 Personal history of nicotine dependence: Secondary | ICD-10-CM

## 2019-07-27 DIAGNOSIS — M25562 Pain in left knee: Secondary | ICD-10-CM | POA: Diagnosis present

## 2019-07-27 DIAGNOSIS — Y92009 Unspecified place in unspecified non-institutional (private) residence as the place of occurrence of the external cause: Secondary | ICD-10-CM | POA: Diagnosis not present

## 2019-07-27 DIAGNOSIS — I10 Essential (primary) hypertension: Secondary | ICD-10-CM | POA: Diagnosis present

## 2019-07-27 DIAGNOSIS — Z23 Encounter for immunization: Secondary | ICD-10-CM | POA: Diagnosis not present

## 2019-07-27 DIAGNOSIS — R45851 Suicidal ideations: Secondary | ICD-10-CM | POA: Diagnosis present

## 2019-07-27 DIAGNOSIS — F411 Generalized anxiety disorder: Secondary | ICD-10-CM | POA: Diagnosis present

## 2019-07-27 DIAGNOSIS — S61512A Laceration without foreign body of left wrist, initial encounter: Secondary | ICD-10-CM | POA: Diagnosis present

## 2019-07-27 DIAGNOSIS — F332 Major depressive disorder, recurrent severe without psychotic features: Principal | ICD-10-CM | POA: Diagnosis present

## 2019-07-27 DIAGNOSIS — X789XXA Intentional self-harm by unspecified sharp object, initial encounter: Secondary | ICD-10-CM | POA: Diagnosis present

## 2019-07-27 DIAGNOSIS — M1000 Idiopathic gout, unspecified site: Secondary | ICD-10-CM | POA: Diagnosis present

## 2019-07-27 DIAGNOSIS — F4010 Social phobia, unspecified: Secondary | ICD-10-CM

## 2019-07-27 DIAGNOSIS — Z20828 Contact with and (suspected) exposure to other viral communicable diseases: Secondary | ICD-10-CM | POA: Diagnosis present

## 2019-07-27 DIAGNOSIS — F1010 Alcohol abuse, uncomplicated: Secondary | ICD-10-CM | POA: Diagnosis present

## 2019-07-27 DIAGNOSIS — R630 Anorexia: Secondary | ICD-10-CM | POA: Diagnosis present

## 2019-07-27 DIAGNOSIS — S61519A Laceration without foreign body of unspecified wrist, initial encounter: Secondary | ICD-10-CM | POA: Diagnosis present

## 2019-07-27 DIAGNOSIS — M109 Gout, unspecified: Secondary | ICD-10-CM | POA: Diagnosis present

## 2019-07-27 DIAGNOSIS — Z7289 Other problems related to lifestyle: Secondary | ICD-10-CM

## 2019-07-27 DIAGNOSIS — D509 Iron deficiency anemia, unspecified: Secondary | ICD-10-CM | POA: Diagnosis present

## 2019-07-27 LAB — SARS CORONAVIRUS 2 BY RT PCR (HOSPITAL ORDER, PERFORMED IN ~~LOC~~ HOSPITAL LAB): SARS Coronavirus 2: NEGATIVE

## 2019-07-27 MED ORDER — ALLOPURINOL 300 MG PO TABS
300.0000 mg | ORAL_TABLET | Freq: Every day | ORAL | Status: DC
  Administered 2019-07-27 – 2019-08-01 (×7): 300 mg via ORAL
  Filled 2019-07-27 (×7): qty 1

## 2019-07-27 MED ORDER — LISINOPRIL 20 MG PO TABS
20.0000 mg | ORAL_TABLET | Freq: Every day | ORAL | Status: DC
  Administered 2019-07-27: 20 mg via ORAL
  Filled 2019-07-27: qty 1

## 2019-07-27 MED ORDER — HYDROXYZINE HCL 25 MG PO TABS
25.0000 mg | ORAL_TABLET | Freq: Three times a day (TID) | ORAL | Status: DC | PRN
  Administered 2019-07-29 – 2019-07-31 (×2): 25 mg via ORAL
  Filled 2019-07-27 (×2): qty 1

## 2019-07-27 MED ORDER — MAGNESIUM HYDROXIDE 400 MG/5ML PO SUSP: 30.0000 mL | Freq: Every day | ORAL | Status: DC | PRN

## 2019-07-27 MED ORDER — CHLORDIAZEPOXIDE HCL 25 MG PO CAPS
25.0000 mg | ORAL_CAPSULE | ORAL | Status: DC | PRN
  Administered 2019-07-28 – 2019-07-29 (×2): 25 mg via ORAL
  Filled 2019-07-27 (×2): qty 1

## 2019-07-27 MED ORDER — TAB-A-VITE/IRON PO TABS
1.0000 | ORAL_TABLET | Freq: Every day | ORAL | Status: DC
  Administered 2019-07-27 – 2019-08-01 (×7): 1 via ORAL
  Filled 2019-07-27 (×7): qty 1

## 2019-07-27 MED ORDER — LISINOPRIL 20 MG PO TABS
20.0000 mg | ORAL_TABLET | Freq: Every day | ORAL | Status: DC
  Administered 2019-07-28 – 2019-07-30 (×3): 20 mg via ORAL
  Filled 2019-07-27 (×3): qty 1

## 2019-07-27 MED ORDER — CHLORDIAZEPOXIDE HCL 25 MG PO CAPS
25.0000 mg | ORAL_CAPSULE | ORAL | Status: AC
  Administered 2019-07-27: 25 mg via ORAL
  Filled 2019-07-27: qty 1

## 2019-07-27 MED ORDER — VITAMIN C 500 MG PO TABS
500.0000 mg | ORAL_TABLET | Freq: Every day | ORAL | Status: DC
  Administered 2019-07-27 – 2019-08-01 (×6): 500 mg via ORAL
  Filled 2019-07-27 (×6): qty 1

## 2019-07-27 MED ORDER — FOLIC ACID 1 MG PO TABS
1.0000 mg | ORAL_TABLET | Freq: Every day | ORAL | Status: DC
  Administered 2019-07-27 – 2019-08-01 (×6): 1 mg via ORAL
  Filled 2019-07-27 (×6): qty 1

## 2019-07-27 MED ORDER — TRAZODONE HCL 50 MG PO TABS
50.0000 mg | ORAL_TABLET | Freq: Every evening | ORAL | Status: DC | PRN
  Administered 2019-07-28 – 2019-07-31 (×4): 50 mg via ORAL
  Filled 2019-07-27 (×4): qty 1

## 2019-07-27 MED ORDER — IBUPROFEN 800 MG PO TABS
800.0000 mg | ORAL_TABLET | Freq: Three times a day (TID) | ORAL | Status: DC | PRN
  Administered 2019-07-27: 800 mg via ORAL
  Filled 2019-07-27: qty 1

## 2019-07-27 MED ORDER — BACITRACIN-NEOMYCIN-POLYMYXIN 400-5-5000 EX OINT
TOPICAL_OINTMENT | Freq: Two times a day (BID) | CUTANEOUS | Status: DC
  Administered 2019-07-27 – 2019-07-28 (×3): 1 via TOPICAL
  Administered 2019-07-29 – 2019-07-30 (×2): via TOPICAL
  Administered 2019-07-30 – 2019-08-01 (×4): 1 via TOPICAL
  Filled 2019-07-27 (×10): qty 1

## 2019-07-27 MED ORDER — ALUM & MAG HYDROXIDE-SIMETH 200-200-20 MG/5ML PO SUSP: 30.0000 mL | ORAL | Status: DC | PRN

## 2019-07-27 MED ORDER — VITAMIN B-1 100 MG PO TABS
250.0000 mg | ORAL_TABLET | Freq: Every day | ORAL | Status: DC
  Administered 2019-07-27 – 2019-08-01 (×6): 250 mg via ORAL
  Filled 2019-07-27 (×6): qty 3

## 2019-07-27 MED ORDER — CITALOPRAM HYDROBROMIDE 20 MG PO TABS
20.0000 mg | ORAL_TABLET | Freq: Every day | ORAL | Status: DC
  Administered 2019-07-27 – 2019-08-01 (×6): 20 mg via ORAL
  Filled 2019-07-27 (×6): qty 1

## 2019-07-27 NOTE — Telephone Encounter (Signed)
07/27/2019 11:43:00 AM - Advair & Ventolin refill  07/27/2019 Placed refill online with [facility] for Ventolin [id number] & Advair 100/50 rx#[id number] to ship 08/24/2019, order# [id number].Delos Haring

## 2019-07-27 NOTE — ED Notes (Signed)
BEHAVIORAL HEALTH ROUNDING Patient sleeping: Yes.   Patient alert and oriented: eyes closed  Appears asleep Behavior appropriate: Yes.  ; If no, describe:  Nutrition and fluids offered: Yes  Toileting and hygiene offered: sleeping Sitter present: q 15 minute observations and security camera monitoring   ENVIRONMENTAL ASSESSMENT Potentially harmful objects out of patient reach: Yes.   Personal belongings secured: Yes.   Patient dressed in hospital provided attire only: Yes.   Plastic bags out of patient reach: Yes.   Patient care equipment (cords, cables, call bells, lines, and drains) shortened, removed, or accounted for: Yes.   Equipment and supplies removed from bottom of stretcher: Yes.   Potentially toxic materials out of patient reach: Yes.   Sharps container removed or out of patient reach: Yes.   

## 2019-07-27 NOTE — Plan of Care (Signed)
  Problem: Education: Goal: Knowledge of [facility] Education information/materials will improve Outcome: Progressing Instructed  patient on [facility] Education  unit programing , able to verbalize understanding of information received

## 2019-07-27 NOTE — ED Provider Notes (Signed)
-----------------------------------------   7:11 AM on 07/27/2019 -----------------------------------------   Blood pressure (!) 141/82, pulse 87, temperature 98.7 F (37.1 C), temperature source Oral, resp. rate 17, height 6' (1.829 m), weight 86 kg, SpO2 97 %.  The patient is calm and cooperative at this time.  There have been no acute events since the last update.  Awaiting disposition plan from Behavioral Medicine and/or Social Work team(s).    Nena Polio, MD 07/27/19 [phone]

## 2019-07-27 NOTE — Tx Team (Deleted)
Initial Treatment Plan 07/27/2019 5:43 PM Miguel [name] [id number]    PATIENT STRESSORS: Financial difficulties Marital or family conflict Substance abuse   PATIENT STRENGTHS: Ability for insight Active sense of humor Average or above average intelligence Capable of independent living Communication skills Supportive family/friends   PATIENT IDENTIFIED PROBLEMS: Depression  07/27/2019  Substances Abuse  07/27/2019  Financial  [id number]  No Socialization/ Friends   07/27/2019               DISCHARGE CRITERIA:  Ability to meet basic life and health needs Adequate post-discharge living arrangements Improved stabilization in mood, thinking, and/or behavior  PRELIMINARY DISCHARGE PLAN: Outpatient therapy Return to previous living arrangement  PATIENT/FAMILY INVOLVEMENT: This treatment plan has been presented to and reviewed with the patient, Miguel [name], and/or family member,  .  The patient and family have been given the opportunity to ask questions and make suggestions.  Leodis [place], RN 07/27/2019, 5:43 PM

## 2019-07-27 NOTE — ED Notes (Signed)
BEHAVIORAL HEALTH ROUNDING Patient sleeping: No. Patient alert and oriented: yes Behavior appropriate: Yes.  ; If no, describe:  Nutrition and fluids offered: yes Toileting and hygiene offered: Yes  Sitter present: q15 minute observations and security camera monitoring   

## 2019-07-27 NOTE — ED Notes (Signed)
ED BHU [place] Is the patient under IVC or is there intent for IVC: Yes.   Is the patient medically cleared: Yes.   Is there vacancy in the ED BHU: Yes.   Is the population mix appropriate for patient: Yes.   Is the patient awaiting placement in inpatient or outpatient setting: Yes.   Admission to [facility] pending Has the patient had a psychiatric consult: Yes.   Survey of unit performed for contraband, proper placement and condition of furniture, tampering with fixtures in bathroom, shower, and each patient room: Yes.  ; Findings:  APPEARANCE/BEHAVIOR Calm and cooperative NEURO ASSESSMENT Orientation: oriented x3   Hallucinations: No.None noted (Hallucinations) denies Speech: Normal Gait: normal RESPIRATORY ASSESSMENT Even  Unlabored respirations  CARDIOVASCULAR ASSESSMENT Pulses equal   regular rate  Skin warm and dry   GASTROINTESTINAL ASSESSMENT no GI complaint EXTREMITIES Full ROM  PLAN OF CARE Provide calm/safe environment. Vital signs assessed twice daily. ED BHU Assessment once each 12-hour shift   Assure the ED provider has rounded once each shift. Provide and encourage hygiene. Provide redirection as needed. Assess for escalating behavior; address immediately and inform ED provider.  Assess family dynamic and appropriateness for visitation as needed: Yes.  ; If necessary, describe findings:  Educate the patient/family about BHU procedures/visitation: Yes.  ; If necessary, describe findings:

## 2019-07-27 NOTE — ED Notes (Signed)
Patient observed lying in bed with eyes closed  Even, unlabored respirations observed   NAD pt appears to be sleeping  I will continue to monitor along with every 15 minute visual observations and ongoing security camera monitoring    

## 2019-07-27 NOTE — Tx Team (Addendum)
Initial Treatment Plan 07/27/2019 5:58 PM [name] [id number]    PATIENT STRESSORS: Financial difficulties Health problems Marital or family conflict Occupational concerns Substance abuse   PATIENT STRENGTHS: Capable of independent living Communication skills   PATIENT IDENTIFIED PROBLEMS: Alteration in  Mood AEB depression, anxiety & crying spells "I [name]'t go out, I have to take care of my wife, I [place]'t have money because I [place]'t work, all of this is making me sad and depressed".  Alteration in sleeping pattern "I have been sleeping only 3-4 hours at night, I can't sleep unless I drink sometimes".   Alteration in eating pattern "I have lost weight because I'm not eating".   Substance abuse                DISCHARGE CRITERIA:  Improved stabilization in mood, thinking, and/or behavior Verbal commitment to aftercare and medication compliance  PRELIMINARY DISCHARGE PLAN: Outpatient therapy Return to previous living arrangement  PATIENT/FAMILY INVOLVEMENT: This treatment plan has been presented to and reviewed with the patient, HARLOWE VARRIALE.  The patient have been given the opportunity to ask questions and make suggestions.  Keane Police, RN 07/27/2019, 5:58 PM

## 2019-07-27 NOTE — [facility] Suicide Risk Assessment (Signed)
[facility] Admission Suicide Risk Assessment   Nursing information obtained from:  Patient Demographic factors:  Male, Caucasian, Low socioeconomic status, Unemployed Current Mental Status:  Self-harm behaviors Loss Factors:  Decrease in vocational status, Financial problems / change in socioeconomic status Historical Factors:  Impulsivity Risk Reduction Factors:  Sense of responsibility to family  Total Time spent with patient: 1 hour Principal Problem: MDD (major depressive disorder), recurrent severe, without psychosis ([facility]) Diagnosis:  Principal Problem:   MDD (major depressive disorder), recurrent severe, without psychosis ([place]) Active Problems:   Gout   Self-inflicted laceration of wrist   Alcohol abuse   Social anxiety disorder  Subjective Data: Patient seen chart reviewed.  Patient with history of alcohol abuse cut himself on the left forearm.  Cuts this time were bad enough to require sutures.  He was intoxicated at the time.  Patient continues to be tearful and gives a history of chronic anxiety sadness feelings of hopelessness.  Recently admits that he has been having wishes that he would just die.  Denies any intent to kill himself at this point.  No psychosis.  Willing to participate in treatment.  Continued Clinical Symptoms:  Alcohol Use Disorder Identification Test Final Score (AUDIT): 16 The "Alcohol Use Disorders Identification Test", Guidelines for Use in Primary Care, Second Edition.  World Pharmacologist [facility] [place]). Score between 0-7:  no or low risk or alcohol related problems. Score between 8-15:  moderate risk of alcohol related problems. Score between 16-19:  high risk of alcohol related problems. Score 20 or above:  warrants further diagnostic evaluation for alcohol dependence and treatment.   CLINICAL FACTORS:   Severe Anxiety and/or Agitation Depression:   Hopelessness Impulsivity Alcohol/Substance Abuse/Dependencies   Musculoskeletal: Strength & Muscle  Tone: within normal limits Gait & Station: normal Patient leans: N/A  Psychiatric Specialty Exam: Physical Exam  Nursing note and vitals reviewed. Constitutional: He appears well-developed and well-nourished.  HENT:  Head: Normocephalic and atraumatic.  Eyes: Pupils are equal, round, and reactive to light. Conjunctivae are normal.  Neck: Normal range of motion.  Cardiovascular: Normal heart sounds.  Respiratory: Effort normal.  GI: Soft.  Musculoskeletal: Normal range of motion.  Neurological: He is alert.  Skin: Skin is warm and dry.     Psychiatric: His mood appears anxious. His speech is tangential. He is agitated. He is not aggressive, not hyperactive and not combative. Thought content is not paranoid. Cognition and memory are impaired. He expresses impulsivity. He exhibits a depressed mood. He expresses suicidal ideation. He expresses no suicidal plans.    Review of Systems  Constitutional: Negative.   HENT: Negative.   Eyes: Negative.   Respiratory: Negative.   Cardiovascular: Negative.   Gastrointestinal: Negative.   Musculoskeletal: Negative.   Skin: Negative.   Neurological: Negative.   Psychiatric/Behavioral: Positive for depression, substance abuse and suicidal ideas. Negative for hallucinations and memory loss. The patient is nervous/anxious and has insomnia.     Blood pressure (!) 157/98, pulse 84, temperature (!) 97 F (36.1 C), temperature source Oral, resp. rate 18, height 6' (1.829 m), weight 84.4 kg, SpO2 100 %.Body mass index is 25.23 kg/m.  General Appearance: Disheveled  Eye Contact:  Fair  Speech:  Clear and Coherent  Volume:  Normal  Mood:  Anxious, Depressed and Hopeless  Affect:  Congruent and Tearful  Thought Process:  Coherent  Orientation:  Full (Time, Place, and Person)  Thought Content:  Logical and Tangential  Suicidal Thoughts:  Yes.  without intent/plan  Homicidal  Thoughts:  No  Memory:  Immediate;   Fair Recent;   Fair Remote;   Fair   Judgement:  Impaired  Insight:  Shallow  Psychomotor Activity:  Restlessness and Tremor  Concentration:  Concentration: Poor  Recall:  [employer] of Knowledge:  Fair  Language:  Fair  Akathisia:  No  Handed:  Right  AIMS (if indicated):     Assets:  Desire for Improvement Housing Social Support  ADL's:  Impaired  Cognition:  WNL  Sleep:         COGNITIVE FEATURES THAT CONTRIBUTE TO RISK:  Thought constriction (tunnel vision)    SUICIDE RISK:   Mild:  Suicidal ideation of limited frequency, intensity, duration, and specificity.  There are no identifiable plans, no associated intent, mild dysphoria and related symptoms, good self-control (both objective and subjective assessment), few other risk factors, and identifiable protective factors, including available and accessible social support.  PLAN OF CARE: Patient admitted to the psychiatric unit.  History obtained.  Labs reviewed.  Suggest starting serotonin reuptake inhibitors for depression and anxiety.  Librium for alcohol withdrawal and anxiety.  Make sure he eats and gets his regular medicine.  Reassess suicidality and appropriate treatment plans prior to discharge  I certify that inpatient services furnished can reasonably be expected to improve the patient's condition.   Alethia Berthold, MD 07/27/2019, 6:16 PM
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
## 2019-07-27 NOTE — BH Assessment (Signed)
Patient is to be admitted to [facility] by Marvia Pickles, NP.  Attending Physician will be Dr. Weber Cooks.   Patient has been assigned to room 304, by [place].   Intake Paper Work has been signed and placed on patient chart.   ER staff is aware of the admission:  Glenda, ER Secretary    Dr. Cinda Quest, ER MD   Amy T., Patient's Nurse   Patient Access.

## 2019-07-27 NOTE — ED Notes (Signed)
IVC/Admit to [facility]- 304

## 2019-07-27 NOTE — H&P (Signed)
Psychiatric Admission Assessment Adult  Patient Identification: Miguel [name] MRN:  [id number] Date of Evaluation:  07/27/2019 Chief Complaint:  Major Depression Disorder Principal Diagnosis: MDD (major depressive disorder), recurrent severe, without psychosis ([place]) Diagnosis:  Principal Problem:   MDD (major depressive disorder), recurrent severe, without psychosis ([place]) Active Problems:   Gout   Self-inflicted laceration of wrist   Alcohol abuse   Social anxiety disorder  History of Present Illness: Patient seen chart reviewed.  This is a 58 year old gentleman well-known to the emergency room or presented with self-inflicted laceration of the left arm while intoxicated.  Patient was voicing suicidal ideation and his cuts to his arm this time were bad enough that they required sutures.  On interview today the patient describes chronic major life stresses.  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  They are suffering financially.  Patient has no friends and no social outlet.  Continues to drink regularly.  Varies the amount but often drinks quite a bit of liquor during the day.  Drinks more when he is anxious.  Mood feels down nervous and dysphoric much of the time.  When sober he is usually not thinking about killing himself but when intoxicated frequently cuts himself.  In the emergency room voiced suicidal ideation.  Tells me today that when he was up there he had hoped that his coronavirus test would be positive so that he would have something he could die of.  No psychosis no hallucinations.  Not receiving any outpatient mental health treatment.  Patient describes what sounds like longstanding social anxiety and some degree of obsessive-compulsive anxiety Associated Signs/Symptoms: Depression Symptoms:  depressed mood, psychomotor retardation, feelings of worthlessness/guilt, difficulty concentrating, hopelessness, suicidal  thoughts without plan, (Hypo) Manic Symptoms:  Impulsivity, Anxiety Symptoms:  Excessive Worry, Obsessive Compulsive Symptoms:   Checking,, Social Anxiety, Psychotic Symptoms:  None reported PTSD Symptoms: Negative Total Time spent with patient: 1 hour  Past Psychiatric History: Patient has had 1 previous hospitalization here in 2018.  He does not even remember that he was placed on antidepressants and referred to [facility].  Apparently did not really follow-up with it.  He has attended the substance abuse intensive outpatient program at [facility] but felt that he did not fit in because most of the people there by what he remembers had drug problems.  Did not really stick with it.  Never really been involved in 12-step groups.  Really no substance abuse treatment to speak of.  This despite chronic obvious alcohol abuse and anxiety and depression.  Multiple episodes of self-inflicted injury.  Is the patient at risk to self? Yes.    Has the patient been a risk to self in the past 6 months? Yes.    Has the patient been a risk to self within the distant past? Yes.    Is the patient a risk to others? No.  Has the patient been a risk to others in the past 6 months? No.  Has the patient been a risk to others within the distant past? No.   Prior Inpatient Therapy:   Prior Outpatient Therapy:    Alcohol Screening: 1. How often do you have a drink containing alcohol?: 2 to 4 times a month 2. How many drinks containing alcohol do you have on a typical day when you are drinking?: 3 or 4 3. How often do you have six or more drinks on one occasion?: Monthly  AUDIT-C Score: 5 4. How often during the last year have you found that you were not able to stop drinking once you had started?: Less than monthly 5. How often during the last year have you failed to do what was normally expected from you becasue of drinking?: Never 6. How often during the last year have you needed a first drink in the morning to get yourself  going after a heavy drinking session?: Never 7. How often during the last year have you had a feeling of guilt of remorse after drinking?: Monthly 8. How often during the last year have you been unable to remember what happened the night before because you had been drinking?: Never 9. Have you or someone else been injured as a result of your drinking?: Yes, during the last year 10. Has a relative or friend or a doctor or another health worker been concerned about your drinking or suggested you cut down?: Yes, during the last year Alcohol Use Disorder Identification Test Final Score (AUDIT): 16 Alcohol Brief Interventions/Follow-up: Alcohol Education, Continued Monitoring, Brief Advice Substance Abuse History in the last 12 months:  Yes.   Consequences of Substance Abuse: Medical Consequences:  Patient has some symptoms and lab studies that would be consistent with cirrhosis.  Additionally obviously when he drinks he is more prone to cutting himself.  Sounds like he may have some memory problems from it as well Previous Psychotropic Medications: Yes  Psychological Evaluations: Yes  Past Medical History:  Past Medical History:  Diagnosis Date  . Alcohol abuse   . Asthma   . GERD (gastroesophageal reflux disease)   . Gout   . Hypertension   . Kidney stone   . OCD (obsessive compulsive disorder)   . Renal colic     Past Surgical History:  Procedure Laterality Date  . CHOLECYSTECTOMY  2012  . EXTRACORPOREAL SHOCK WAVE LITHOTRIPSY Left 01/12/2019   Procedure: EXTRACORPOREAL SHOCK WAVE LITHOTRIPSY (ESWL);  Surgeon: Billey Co, MD;  Location: [facility] ORS;  Service: Urology;  Laterality: Left;   Family History:  Family History  Problem Relation Age of Onset  . Alcohol abuse Father   . Breast cancer Mother 5   Family Psychiatric  History: Alcohol abuse in the family Tobacco Screening:   Social History:  Social History   Substance and Sexual Activity  Alcohol Use Yes  .  Alcohol/week: 2.0 standard drinks  . Types: 2 Glasses of wine per week   Comment: daily     Social History   Substance and Sexual Activity  Drug Use No    Additional Social History:                           Allergies:   Allergies  Allergen Reactions  . Percocet [Oxycodone-Acetaminophen] Nausea And Vomiting   Lab Results:  Results for orders placed or performed during the hospital encounter of 07/26/19 (from the past 48 hour(s))  Comprehensive metabolic panel     Status: Abnormal   Collection Time: 07/26/19  1:56 PM  Result Value Ref Range   Sodium 141 135 - 145 mmol/L   Potassium 3.6 3.5 - 5.1 mmol/L   Chloride 107 98 - 111 mmol/L   CO2 24 22 - 32 mmol/L   Glucose, Bld 114 (H) 70 - 99 mg/dL   BUN 13 6 - 20 mg/dL   Creatinine, Ser 1.07 0.61 - 1.24 mg/dL   Calcium 8.6 (L) 8.9 - 10.3 mg/dL  Total Protein 6.3 (L) 6.5 - 8.1 g/dL   Albumin 3.6 3.5 - 5.0 g/dL   AST 14 (L) 15 - 41 U/L   ALT 16 0 - 44 U/L   Alkaline Phosphatase 68 38 - 126 U/L   Total Bilirubin 0.8 0.3 - 1.2 mg/dL   GFR calc non Af Amer >60 >60 mL/min   GFR calc Af Amer >60 >60 mL/min   Anion gap 10 5 - 15    Comment: Performed at [facility], [address] [place], [place] [address]  Ethanol     Status: Abnormal   Collection Time: 07/26/19  1:56 PM  Result Value Ref Range   Alcohol, Ethyl (B) 222 (H) <10 mg/dL    Comment: (NOTE) Lowest detectable limit for serum alcohol is 10 mg/dL. For medical purposes only. Performed at [facility], [address] [place], [place] [id number]   Salicylate level     Status: None   Collection Time: 07/26/19  1:56 PM  Result Value Ref Range   Salicylate Lvl [id number] 2.8 - 30.0 mg/dL    Comment: Performed at [facility], [address] [place], [place] [address]  Acetaminophen level     Status: Abnormal   Collection Time: 07/26/19  1:56 PM  Result Value Ref Range   Acetaminophen (Tylenol), Serum <10 (L) 10 - 30 ug/mL     Comment: (NOTE) Therapeutic concentrations vary significantly. A range of 10-30 ug/mL  may be an effective concentration for many patients. However, some  are best treated at concentrations outside of this range. Acetaminophen concentrations >150 ug/mL at 4 hours after ingestion  and >50 ug/mL at 12 hours after ingestion are often associated with  toxic reactions. Performed at [facility], [address] [place], [place] [address]   cbc     Status: Abnormal   Collection Time: 07/26/19  1:56 PM  Result Value Ref Range   WBC 6.8 4.0 - 10.5 K/uL   RBC 4.33 4.22 - 5.81 MIL/uL   Hemoglobin 12.1 (L) 13.0 - 17.0 g/dL   HCT 38.0 (L) 39.0 - 52.0 %   MCV 87.8 80.0 - 100.0 fL   MCH 27.9 26.0 - 34.0 pg   MCHC 31.8 30.0 - 36.0 g/dL   RDW 17.3 (H) 11.5 - 15.5 %   Platelets 241 150 - 400 K/uL   nRBC 0.0 0.0 - 0.2 %    Comment: Performed at [facility], [address] [place], [place] [address]  Urine Drug Screen, Qualitative     Status: Abnormal   Collection Time: 07/26/19  2:24 PM  Result Value Ref Range   Tricyclic, Ur Screen NONE DETECTED NONE DETECTED   Amphetamines, Ur Screen NONE DETECTED NONE DETECTED   MDMA (Ecstasy)Ur Screen NONE DETECTED NONE DETECTED   Cocaine Metabolite,Ur [place] NONE DETECTED NONE DETECTED   Opiate, Ur Screen NONE DETECTED NONE DETECTED   Phencyclidine (PCP) Ur S NONE DETECTED NONE DETECTED   Cannabinoid 50 Ng, Ur [place] POSITIVE (A) NONE DETECTED   Barbiturates, Ur Screen NONE DETECTED NONE DETECTED   Benzodiazepine, Ur Scrn POSITIVE (A) NONE DETECTED   Methadone Scn, Ur NONE DETECTED NONE DETECTED    Comment: (NOTE) Tricyclics + metabolites, urine    Cutoff 1000 ng/mL Amphetamines + metabolites, urine  Cutoff 1000 ng/mL MDMA (Ecstasy), urine              Cutoff 500 ng/mL Cocaine Metabolite, urine  Cutoff 300 ng/mL Opiate + metabolites, urine        Cutoff 300 ng/mL Phencyclidine (PCP), urine         Cutoff 25 ng/mL Cannabinoid,  urine                 Cutoff 50 ng/mL Barbiturates + metabolites, urine  Cutoff 200 ng/mL Benzodiazepine, urine              Cutoff 200 ng/mL Methadone, urine                   Cutoff 300 ng/mL The urine drug screen provides only a preliminary, unconfirmed analytical test result and should not be used for non-medical purposes. Clinical consideration and professional judgment should be applied to any positive drug screen result due to possible interfering substances. A more specific alternate chemical method must be used in order to obtain a confirmed analytical result. Gas chromatography / mass spectrometry (GC/MS) is the preferred confirmat ory method. Performed at [facility], [address] [place], [place] [address]   SARS Coronavirus 2 [facility] order, Performed in [facility] hospital lab) Nasopharyngeal Nasopharyngeal Swab     Status: None   Collection Time: 07/27/19 12:53 PM   Specimen: Nasopharyngeal Swab  Result Value Ref Range   SARS Coronavirus 2 NEGATIVE NEGATIVE    Comment: (NOTE) If result is NEGATIVE SARS-CoV-2 target nucleic acids are NOT DETECTED. The SARS-CoV-2 RNA is generally detectable in upper and lower  respiratory specimens during the acute phase of infection. The lowest  concentration of SARS-CoV-2 viral copies this assay can detect is 250  copies / mL. A negative result does not preclude SARS-CoV-2 infection  and should not be used as the sole basis for treatment or other  patient management decisions.  A negative result may occur with  improper specimen collection / handling, submission of specimen other  than nasopharyngeal swab, presence of viral mutation(s) within the  areas targeted by this assay, and inadequate number of viral copies  (<250 copies / mL). A negative result must be combined with clinical  observations, patient history, and epidemiological information. If result is POSITIVE SARS-CoV-2 target nucleic acids are DETECTED.  The SARS-CoV-2 RNA is generally detectable in upper and lower  respiratory specimens dur ing the acute phase of infection.  Positive  results are indicative of active infection with SARS-CoV-2.  Clinical  correlation with patient history and other diagnostic information is  necessary to determine patient infection status.  Positive results do  not rule out bacterial infection or co-infection with other viruses. If result is PRESUMPTIVE POSTIVE SARS-CoV-2 nucleic acids MAY BE PRESENT.   A presumptive positive result was obtained on the submitted specimen  and confirmed on repeat testing.  While 2019 novel coronavirus  (SARS-CoV-2) nucleic acids may be present in the submitted sample  additional confirmatory testing may be necessary for epidemiological  and / or clinical management purposes  to differentiate between  SARS-CoV-2 and other Sarbecovirus currently known to infect humans.  If clinically indicated additional testing with an alternate test  methodology [id number]) is advised. The SARS-CoV-2 RNA is generally  detectable in upper and lower respiratory sp ecimens during the acute  phase of infection. The expected result is Negative. Fact Sheet for Patients:  StrictlyIdeas.no Fact Sheet for Healthcare Providers: BankingDealers.co.za This test is not yet approved or cleared by the [place] FDA and has been authorized for detection and/or diagnosis of SARS-CoV-2 by FDA under an Emergency Use Authorization (  EUA).  This EUA will remain in effect (meaning this test can be used) for the duration of the COVID-19 declaration under Section 564(b)(1) of the Act, 21 U.S.C. section 360bbb-3(b)(1), unless the authorization is terminated or revoked sooner. Performed at [facility], [address] [place], [place] [address]     Blood Alcohol level:  Lab Results  Component Value Date   ETH 222 (H) 07/26/2019   ETH 175 (H)  [id number]    Metabolic Disorder Labs:  Lab Results  Component Value Date   HGBA1C 5.1 03/12/2017   MPG 100 03/12/2017   No results found for: PROLACTIN Lab Results  Component Value Date   CHOL 144 03/12/2017   TRIG 107 03/12/2017   HDL 52 03/12/2017   CHOLHDL 2.8 03/12/2017   VLDL 21 03/12/2017   LDLCALC 71 03/12/2017   LDLCALC 70 06/27/2014    Current Medications: Current Facility-Administered Medications  Medication Dose Route Frequency Provider Last Rate Last Dose  . allopurinol (ZYLOPRIM) tablet 300 mg  300 mg Oral Daily Tucker Minter T, MD      . alum & mag hydroxide-simeth (MAALOX/MYLANTA) 200-200-20 MG/5ML suspension 30 mL  30 mL Oral Q4H PRN Money, Lowry Ram, FNP      . chlordiazePOXIDE (LIBRIUM) capsule 25 mg  25 mg Oral NOW Nnenna Meador T, MD      . chlordiazePOXIDE (LIBRIUM) capsule 25 mg  25 mg Oral Q4H PRN Elika Godar T, MD      . citalopram (CELEXA) tablet 20 mg  20 mg Oral Daily Khyli Swaim T, MD      . folic acid (FOLVITE) tablet 1 mg  1 mg Oral Daily Duilio Heritage T, MD      . hydrOXYzine (ATARAX/VISTARIL) tablet 25 mg  25 mg Oral TID PRN Money, Lowry Ram, FNP      . [START ON 07/28/2019] lisinopril (ZESTRIL) tablet 20 mg  20 mg Oral Daily Money, Darnelle Maffucci B, FNP      . magnesium hydroxide (MILK OF MAGNESIA) suspension 30 mL  30 mL Oral Daily PRN Money, Darnelle Maffucci B, FNP      . multivitamins with iron tablet 1 tablet  1 tablet Oral Daily Karriem Muench T, MD      . neomycin-bacitracin-polymyxin (NEOSPORIN) ointment packet   Topical BID Becky Berberian T, MD      . thiamine (VITAMIN B-1) tablet 250 mg  250 mg Oral Daily Jeliyah Middlebrooks T, MD      . traZODone (DESYREL) tablet 50 mg  50 mg Oral QHS PRN Money, Lowry Ram, FNP      . vitamin C (ASCORBIC ACID) tablet 500 mg  500 mg Oral Daily Kysen Wetherington, Madie Reno, MD       PTA Medications: Medications Prior to Admission  Medication Sig Dispense Refill Last Dose  . albuterol (VENTOLIN HFA) 108 (90 Base) MCG/ACT inhaler Inhale 2  puffs into the lungs every 4 (four) hours as needed. (Patient taking differently: Inhale 2 puffs into the lungs every 4 (four) hours as needed for wheezing or shortness of breath. ) 72 g 0   . allopurinol (ZYLOPRIM) 300 MG tablet Take 1 tablet (300 mg total) by mouth daily. 90 tablet 3   . cefUROXime (CEFTIN) 250 MG tablet Take 1 tablet (250 mg total) by mouth 2 (two) times daily for 10 days. 20 tablet 0   . chlordiazePOXIDE (LIBRIUM) 25 MG capsule Take 1 capsule (25 mg total) by mouth 3 (three) times daily as needed for withdrawal. Additional  taper in discharge instructions. 15 capsule 0   . colchicine 0.6 MG tablet Take 1 tablet (0.6 mg total) by mouth daily. (Patient taking differently: Take 0.6 mg by mouth daily as needed. ) 4 tablet 0   . diclofenac sodium (VOLTAREN) 1 % GEL Apply 2 g topically 4 (four) times daily. 50 g 0   . docusate sodium (COLACE) 100 MG capsule Take 1 tablet once or twice daily as needed for constipation while taking narcotic pain medicine 30 capsule 0   . Fluticasone-Salmeterol (ADVAIR DISKUS) 100-50 MCG/DOSE AEPB Inhale 1 puff into the lungs 2 (two) times daily. [id number] each 0   . folic acid (FOLVITE) 1 MG tablet Take 1 tablet (1 mg total) by mouth daily. 90 tablet 3   . gabapentin (NEURONTIN) 100 MG capsule Take 2 capsules (200 mg total) by mouth 3 (three) times daily. 90 capsule 2   . lisinopril (PRINIVIL,ZESTRIL) 20 MG tablet Take 1 tablet (20 mg total) by mouth daily. 30 tablet 1   . methocarbamol (ROBAXIN) 500 MG tablet Take 1 tablet (500 mg total) by mouth 4 (four) times daily. 20 tablet 0   . Multiple Vitamins-Minerals (MULTIVITAMIN WITH MINERALS) tablet Take 1 tablet by mouth daily. 90 tablet 3   . promethazine (PHENERGAN) 12.5 MG tablet Take 1 tablet (12.5 mg total) by mouth every 6 (six) hours as needed. 12 tablet 0   . tiZANidine (ZANAFLEX) 4 MG tablet Take 0.5 tablets (2 mg total) by mouth every 8 (eight) hours as needed for muscle spasms. 30 tablet 1      Musculoskeletal: Strength & Muscle Tone: within normal limits Gait & Station: normal Patient leans: N/A  Psychiatric Specialty Exam: Physical Exam  Nursing note and vitals reviewed. Constitutional: He appears well-developed and well-nourished.  HENT:  Head: Normocephalic and atraumatic.  Eyes: Pupils are equal, round, and reactive to light. Conjunctivae are normal.  Neck: Normal range of motion.  Cardiovascular: Regular rhythm and normal heart sounds.  Respiratory: Effort normal.  GI: Soft.  Musculoskeletal: Normal range of motion.  Neurological: He is alert.  Skin: Skin is warm and dry.     Psychiatric: His mood appears anxious. His speech is tangential. He is agitated. He is not aggressive, not hyperactive and not combative. Thought content is not paranoid. Cognition and memory are impaired. He expresses impulsivity. He exhibits a depressed mood. He expresses suicidal ideation. He expresses no homicidal ideation. He expresses no suicidal plans.    Review of Systems  Constitutional: Negative.   HENT: Negative.   Eyes: Negative.   Respiratory: Negative.   Cardiovascular: Negative.   Gastrointestinal: Negative.   Musculoskeletal: Negative.   Skin: Negative.   Neurological: Negative.   Psychiatric/Behavioral: Positive for depression, substance abuse and suicidal ideas. Negative for hallucinations and memory loss. The patient is nervous/anxious and has insomnia.     Blood pressure (!) 157/98, pulse 84, temperature (!) 97 F (36.1 C), temperature source Oral, resp. rate 18, height 6' (1.829 m), weight 84.4 kg, SpO2 100 %.Body mass index is 25.23 kg/m.  General Appearance: Disheveled  Eye Contact:  Fair  Speech:  Clear and Coherent  Volume:  Increased  Mood:  Anxious and Depressed  Affect:  Depressed and Tearful  Thought Process:  Coherent  Orientation:  Full (Time, Place, and Person)  Thought Content:  Rumination and Tangential  Suicidal Thoughts:  Yes.  without  intent/plan  Homicidal Thoughts:  No  Memory:  Immediate;   Fair Recent;   Fair Remote;  Fair  Judgement:  Impaired  Insight:  Shallow  Psychomotor Activity:  Tremor  Concentration:  Concentration: Fair  Recall:  [employer] of Knowledge:  Fair  Language:  Fair  Akathisia:  No  Handed:  Right  AIMS (if indicated):     Assets:  Desire for Improvement Housing  ADL's:  Impaired  Cognition:  Impaired,  Mild  Sleep:       Treatment Plan Summary: Daily contact with patient to assess and evaluate symptoms and progress in treatment, Medication management and Plan Patient has no history of seizures or DTs but is a little bit shaky.  We will give him some Librium for his anxiety and alcohol withdrawal and monitor for any symptoms of alcohol withdrawal.  Medicine to help him sleep.  I propose starting citalopram because he is very worried about finances.  We will start that at 20 mg for anxiety and depression.  Continue his usual outpatient medicine.  Patient I hope we will be able to meet with the liaison from [facility] tomorrow so that we can come up with some kind of follow-up plan.  Unfortunate gentleman really is pretty high risk for ongoing drinking and possible self injury without treatment intervention.  I know that he is already anxious to get discharged because he is worried about his wife however I would like to see if we can maybe actually make a difference.  Observation Level/Precautions:  15 minute checks  Laboratory:  UA  Psychotherapy:    Medications:    Consultations:    Discharge Concerns:    Estimated LOS:  Other:     Physician Treatment Plan for Primary Diagnosis: MDD (major depressive disorder), recurrent severe, without psychosis ([place]) Long Term Goal(s): Improvement in symptoms so as ready for discharge  Short Term Goals: Ability to verbalize feelings will improve, Ability to disclose and discuss suicidal ideas and Ability to demonstrate self-control will improve   Physician Treatment Plan for Secondary Diagnosis: Principal Problem:   MDD (major depressive disorder), recurrent severe, without psychosis ([place]) Active Problems:   Gout   Self-inflicted laceration of wrist   Alcohol abuse   Social anxiety disorder  Long Term Goal(s): Improvement in symptoms so as ready for discharge  Short Term Goals: Ability to maintain clinical measurements within normal limits will improve and Compliance with prescribed medications will improve  I certify that inpatient services furnished can reasonably be expected to improve the patient's condition.    Alethia Berthold, MD 9/10/20206:20 PM

## 2019-07-27 NOTE — ED Notes (Signed)
Pt is in [place] room 5   No kerlex to laceration sites  Pt with large bandaid x2 over puncture wound from yesterday   Lacerations from earlier this week are open to air

## 2019-07-28 LAB — CK: Total CK: 36 U/L — ABNORMAL LOW (ref 49–397)

## 2019-07-28 MED ORDER — CEPHALEXIN 500 MG PO CAPS
500.0000 mg | ORAL_CAPSULE | Freq: Three times a day (TID) | ORAL | Status: DC
  Administered 2019-07-28 – 2019-08-01 (×11): 500 mg via ORAL
  Filled 2019-07-28 (×11): qty 1

## 2019-07-28 NOTE — Progress Notes (Signed)
Recreation Therapy Notes   Date: 07/28/2019  Time: 9:30 am   Location: Craft room   Behavioral response: N/A   Intervention Topic: Communication    Discussion/Intervention: Patient did not attend group.   Clinical Observations/Feedback:  Patient did not attend group.   Margert Edsall LRT/CTRS        Kloee Ballew 07/28/2019 11:33 AM

## 2019-07-28 NOTE — Progress Notes (Signed)
Patient endorsing anxiety, will give PRN medication for comfort.

## 2019-07-28 NOTE — [facility] Group Notes (Signed)
LCSW Group Therapy Note  07/28/2019 11:55 AM  Type of Therapy and Topic:  Group Therapy:  Feelings around Relapse and Recovery  Participation Level:  Did Not Attend   Description of Group:    Patients in this group will discuss emotions they experience before and after a relapse. They will process how experiencing these feelings, or avoidance of experiencing them, relates to having a relapse. Facilitator will guide patients to explore emotions they have related to recovery. Patients will be encouraged to process which emotions are more powerful. They will be guided to discuss the emotional reaction significant others in their lives may have to their relapse or recovery. Patients will be assisted in exploring ways to respond to the emotions of others without this contributing to a relapse.  Therapeutic Goals: 1. Patient will identify two or more emotions that lead to a relapse for them 2. Patient will identify two emotions that result when they relapse 3. Patient will identify two emotions related to recovery 4. Patient will demonstrate ability to communicate their needs through discussion and/or role plays   Summary of Patient Progress: x    Therapeutic Modalities:   Cognitive Behavioral Therapy Solution-Focused Therapy Assertiveness Training Relapse Prevention Therapy   [name], MSW, LCSW Clinical Social Work 07/28/2019 11:55 AM

## 2019-07-28 NOTE — Consult Note (Signed)
[facility] NAME: Miguel [name]    MR#:  [id number]  DATE OF BIRTH:  1961-04-04  DATE OF CONSULT:  07/28/2019  PRIMARY CARE PHYSICIAN: [facility]   REQUESTING/REFERRING PHYSICIAN: Dr. Weber Cooks   CHIEF COMPLAINT:  No chief complaint on file. Left arm swelling, redness.   HISTORY OF PRESENT ILLNESS:  Miguel [name]  is a 58 y.o. male with a known history of alcohol abuse, hypertension, GERD, gout, history of OCD who presented to the hospital yesterday secondary to a self-inflicted laceration and suicide attempt secondary to depression and he was also intoxicated.  Patient is admitted today behavioral medicine unit and hospitalist services were contacted as he noted some increased redness and swelling of his left arm where he had his laceration sutured.  Patient denies any fevers, chills, tingling in his left upper extremity, loss of sensation or loss of motor activity in the left arm.  He denies any chest pain shortness of breath abdominal pain, or any other associated symptoms presently.  Hospitalist services were consulted for possible cellulitis of the left upper extremity.  PAST MEDICAL HISTORY:   Past Medical History:  Diagnosis Date  . Alcohol abuse   . Asthma   . GERD (gastroesophageal reflux disease)   . Gout   . Hypertension   . Kidney stone   . OCD (obsessive compulsive disorder)   . Renal colic     PAST SURGICAL HISTOIRY:   Past Surgical History:  Procedure Laterality Date  . CHOLECYSTECTOMY  2012  . EXTRACORPOREAL SHOCK WAVE LITHOTRIPSY Left 01/12/2019   Procedure: EXTRACORPOREAL SHOCK WAVE LITHOTRIPSY (ESWL);  Surgeon: Billey Co, MD;  Location: [facility] ORS;  Service: Urology;  Laterality: Left;    SOCIAL HISTORY:   Social History   Tobacco Use  . Smoking status: Former Research scientist (life sciences)  . Smokeless tobacco: Never Used  . Tobacco comment: quit 30 years ago  Substance Use Topics  . Alcohol use: Yes   Alcohol/week: 2.0 standard drinks    Types: 2 Glasses of wine per week    Comment: daily    FAMILY HISTORY:   Family History  Problem Relation Age of Onset  . Alcohol abuse Father   . Breast cancer Mother 10    DRUG ALLERGIES:   Allergies  Allergen Reactions  . Percocet [Oxycodone-Acetaminophen] Nausea And Vomiting    REVIEW OF SYSTEMS:   Review of Systems  Constitutional: Negative for fever and weight loss.  HENT: Negative for congestion, nosebleeds and tinnitus.   Eyes: Negative for blurred vision, double vision and redness.  Respiratory: Negative for cough, hemoptysis and shortness of breath.   Cardiovascular: Negative for chest pain, orthopnea, leg swelling and PND.  Gastrointestinal: Negative for abdominal pain, diarrhea, melena, nausea and vomiting.  Genitourinary: Negative for dysuria, hematuria and urgency.  Musculoskeletal: Negative for falls and joint pain.  Neurological: Negative for dizziness, tingling, sensory change, focal weakness, seizures, weakness and headaches.  Endo/Heme/Allergies: Negative for polydipsia. Does not bruise/bleed easily.  Psychiatric/Behavioral: Positive for depression. Negative for memory loss. The patient is not nervous/anxious.      MEDICATIONS AT HOME:   Prior to Admission medications   Medication Sig Start Date End Date Taking? Authorizing Provider  albuterol (VENTOLIN HFA) 108 (90 Base) MCG/ACT inhaler Inhale 2 puffs into the lungs every 4 (four) hours as needed. Patient taking differently: Inhale 2 puffs into the lungs every 4 (four) hours as needed for wheezing or shortness of breath.  09/01/18  Tukov-Yual, Magdalene S, NP  allopurinol (ZYLOPRIM) 300 MG tablet Take 1 tablet (300 mg total) by mouth daily. 09/01/18   Tukov-Yual, Arlyss Gandy, NP  cefUROXime (CEFTIN) 250 MG tablet Take 1 tablet (250 mg total) by mouth 2 (two) times daily for 10 days. 07/19/19 07/29/19  Vanessa Burkburnett, MD  chlordiazePOXIDE (LIBRIUM) 25 MG capsule Take 1  capsule (25 mg total) by mouth 3 (three) times daily as needed for withdrawal. Additional taper in discharge instructions. 07/19/19   Vanessa Ridgely, MD  colchicine 0.6 MG tablet Take 1 tablet (0.6 mg total) by mouth daily. Patient taking differently: Take 0.6 mg by mouth daily as needed.  03/09/19   Lang Snow, NP  diclofenac sodium (VOLTAREN) 1 % GEL Apply 2 g topically 4 (four) times daily. 06/14/19   Iloabachie, Chioma E, NP  docusate sodium (COLACE) 100 MG capsule Take 1 tablet once or twice daily as needed for constipation while taking narcotic pain medicine 01/09/19   Hinda Kehr, MD  Fluticasone-Salmeterol (ADVAIR DISKUS) 100-50 MCG/DOSE AEPB Inhale 1 puff into the lungs 2 (two) times daily. 09/01/18   Tukov-Yual, Arlyss Gandy, NP  folic acid (FOLVITE) 1 MG tablet Take 1 tablet (1 mg total) by mouth daily. 09/01/18   Tukov-Yual, Arlyss Gandy, NP  gabapentin (NEURONTIN) 100 MG capsule Take 2 capsules (200 mg total) by mouth 3 (three) times daily. 04/21/19   Patrecia Pour, NP  lisinopril (PRINIVIL,ZESTRIL) 20 MG tablet Take 1 tablet (20 mg total) by mouth daily. 02/21/19   Iloabachie, Chioma E, NP  methocarbamol (ROBAXIN) 500 MG tablet Take 1 tablet (500 mg total) by mouth 4 (four) times daily. 07/12/19   Duanne Guess, PA-C  Multiple Vitamins-Minerals (MULTIVITAMIN WITH MINERALS) tablet Take 1 tablet by mouth daily. 09/01/18   Tukov-Yual, Arlyss Gandy, NP  promethazine (PHENERGAN) 12.5 MG tablet Take 1 tablet (12.5 mg total) by mouth every 6 (six) hours as needed. 05/17/19   Merlyn Lot, MD  tiZANidine (ZANAFLEX) 4 MG tablet Take 0.5 tablets (2 mg total) by mouth every 8 (eight) hours as needed for muscle spasms. 04/26/19   Iloabachie, Chioma E, NP      VITAL SIGNS:  Blood pressure (!) 157/98, pulse 84, temperature (!) 97 F (36.1 C), temperature source Oral, resp. rate 18, height 6' (1.829 m), weight 84.4 kg, SpO2 100 %.  PHYSICAL EXAMINATION:  GENERAL:  58 y.o.-year-old patient  lying in the bed in no acute distress.  EYES: Pupils equal, round, reactive to light and accommodation. No scleral icterus. Extraocular muscles intact.  HEENT: Head atraumatic, normocephalic. Oropharynx and nasopharynx clear.  NECK:  Supple, no jugular venous distention. No thyroid enlargement, no tenderness.  LUNGS: Normal breath sounds bilaterally, no wheezing, rales, rhonchi . No use of accessory muscles of respiration.  CARDIOVASCULAR: S1, S2, RRR. No murmurs, rubs, gallops, clicks.  ABDOMEN: Soft, nontender, nondistended. Bowel sounds present. No organomegaly or mass.  EXTREMITIES: No pedal edema, cyanosis, or clubbing. Left upper Ext. Swelling > right. With laceration that was sutured as pictured below.       NEUROLOGIC: Cranial nerves II through XII are intact. No focal motor or sensory deficits appreciated bilaterally  PSYCHIATRIC: The patient is alert and oriented x 3. Good affect SKIN: No obvious rash, lesion, or ulcer.   LABORATORY PANEL:   CBC Recent Labs  Lab 07/26/19 1356  WBC 6.8  HGB 12.1*  HCT 38.0*  PLT 241   ------------------------------------------------------------------------------------------------------------------  Chemistries  Recent Labs  Lab 07/26/19 1356  NA 141  K 3.6  CL 107  CO2 24  GLUCOSE 114*  BUN 13  CREATININE 1.07  CALCIUM 8.6*  AST 14*  ALT 16  ALKPHOS 68  BILITOT 0.8   ------------------------------------------------------------------------------------------------------------------  Cardiac Enzymes No results for input(s): TROPONINI in the last 168 hours. ------------------------------------------------------------------------------------------------------------------  RADIOLOGY:  No results found.   IMPRESSION AND PLAN:   58 y.o. male with a known history of alcohol abuse, hypertension, GERD, gout, history of OCD who presented to the hospital yesterday secondary to a self-inflicted laceration and suicide attempt  secondary to depression and he was also intoxicated.  Patient was admitted to the behavioral medicine unit and today noticed some left upper extremity swelling redness and therefore hospitalist services contacted for consultation.  1.  Left upper extremity redness and swelling-this is secondary to the self-inflicted laceration with surrounding hematoma.  Patient does have some significant bruising but no clinical evidence of worsening cellulitis.  He does not appear to have any lymphangitic spread. - Continue supportive care to keep the arm elevated, advised nurse to give him a ice pack to apply to the swelling, will empirically place him on Keflex for 5 days. -If the swelling were to get worse he is at risk for compartment syndrome but clinically he does not have that presently.  Will order CKs, if needed would consider getting a CT of the left upper extremity if swelling got worse.  2.  Alcohol abuse- continue Librium taper as patient is high risk for alcohol withdrawal. -Continue thiamine supplements.  3.  Essential hypertension-continue lisinopril.  4.  Depression-continue Celexa.  5.  Gout-no acute attack.  Continue allopurinol.  Thanks for the consultation will follow along with you.  All the records are reviewed and case discussed with Consulting provider. Management plans discussed with the patient, family and they are in agreement.  CODE STATUS: Full code  TOTAL TIME TAKING CARE OF THIS PATIENT: 40 minutes.    Henreitta Leber M.D on 07/28/2019 at 5:35 PM  Between 7am to 6pm - Pager - [phone]  After 6pm go to [email or web address] - password [email or web address] [facility]  [place] [facility]  Office  [phone]  CC: Primary care Physician: [facility]

## 2019-07-28 NOTE — Progress Notes (Signed)
[facility] MD Progress Note  07/28/2019 5:31 PM Miguel [name]  MRN:  [id number] Subjective: Follow-up for this patient with alcohol abuse depression and anxiety disorder.  Patient says he is feeling a little better today.  He slept much of the morning and missed lunch but attended some groups and was able to go out and eat dinner around other people.  He is still having some tremors in his hand but there is no sign of any delirium and he certainly has not had a seizure.  His blood pressure is running a little bit high but his pulse is not elevated.  Patient says his mood is feeling slightly better.  Denies any current suicidal thoughts.  Most notable acute complaint is that his left forearm is much more painful.  To my observation in the left forearm around the lacerations looks quite swollen.  Clearly more tender than you would normally expect.  Much more red.  No report of chills however.  I do not see a temperature recorded. Principal Problem: MDD (major depressive disorder), recurrent severe, without psychosis ([place]) Diagnosis: Principal Problem:   MDD (major depressive disorder), recurrent severe, without psychosis ([place]) Active Problems:   Gout   Self-inflicted laceration of wrist   Alcohol abuse   Social anxiety disorder  Total Time spent with patient: 30 minutes  Past Psychiatric History: Patient has a history of alcohol abuse and depression and anxiety.  Multiple self injuries in the past mostly associated with intoxication  Past Medical History:  Past Medical History:  Diagnosis Date  . Alcohol abuse   . Asthma   . GERD (gastroesophageal reflux disease)   . Gout   . Hypertension   . Kidney stone   . OCD (obsessive compulsive disorder)   . Renal colic     Past Surgical History:  Procedure Laterality Date  . CHOLECYSTECTOMY  2012  . EXTRACORPOREAL SHOCK WAVE LITHOTRIPSY Left 01/12/2019   Procedure: EXTRACORPOREAL SHOCK WAVE LITHOTRIPSY (ESWL);  Surgeon: Billey Co, MD;  Location:  [facility] ORS;  Service: Urology;  Laterality: Left;   Family History:  Family History  Problem Relation Age of Onset  . Alcohol abuse Father   . Breast cancer Mother 21   Family Psychiatric  History: Alcohol Social History:  Social History   Substance and Sexual Activity  Alcohol Use Yes  . Alcohol/week: 2.0 standard drinks  . Types: 2 Glasses of wine per week   Comment: daily     Social History   Substance and Sexual Activity  Drug Use No    Social History   Socioeconomic History  . Marital status: Married    Spouse name: Not on file  . Number of children: Not on file  . Years of education: Not on file  . Highest education level: Not on file  Occupational History  . Not on file  Social Needs  . Financial resource strain: Very hard  . Food insecurity    Worry: Never true    Inability: Never true  . Transportation needs    Medical: No    Non-medical: No  Tobacco Use  . Smoking status: Former Research scientist (life sciences)  . Smokeless tobacco: Never Used  . Tobacco comment: quit 30 years ago  Substance and Sexual Activity  . Alcohol use: Yes    Alcohol/week: 2.0 standard drinks    Types: 2 Glasses of wine per week    Comment: daily  . Drug use: No  . Sexual activity: Yes  Lifestyle  . Physical  activity    Days per week: 2 days    Minutes per session: 10 min  . Stress: Very much  Relationships  . Social Herbalist on phone: Once a week    Gets together: Not on file    Attends religious service: Never    Active member of club or organization: No    Attends meetings of clubs or organizations: Never    Relationship status: Married  Other Topics Concern  . Not on file  Social History Narrative   Lives at home with his wife and takes care of his wife. Independent at baseline      - Biggest strain is financial but doesn't think they could got get more help.      Patient expressed interest in possible financial assistance. Informed written consent obtained   Additional  Social History:                         Sleep: Poor  Appetite:  Fair  Current Medications: Current Facility-Administered Medications  Medication Dose Route Frequency Provider Last Rate Last Dose  . allopurinol (ZYLOPRIM) tablet 300 mg  300 mg Oral Daily , Madie Reno, MD   300 mg at 07/28/19 0849  . alum & mag hydroxide-simeth (MAALOX/MYLANTA) 200-200-20 MG/5ML suspension 30 mL  30 mL Oral Q4H PRN Money, Lowry Ram, FNP      . chlordiazePOXIDE (LIBRIUM) capsule 25 mg  25 mg Oral Q4H PRN , Madie Reno, MD   25 mg at 07/28/19 0848  . citalopram (CELEXA) tablet 20 mg  20 mg Oral Daily , Madie Reno, MD   20 mg at 07/28/19 0744  . folic acid (FOLVITE) tablet 1 mg  1 mg Oral Daily , Madie Reno, MD   1 mg at 07/28/19 0744  . hydrOXYzine (ATARAX/VISTARIL) tablet 25 mg  25 mg Oral TID PRN Money, Lowry Ram, FNP      . lisinopril (ZESTRIL) tablet 20 mg  20 mg Oral Daily Money, Lowry Ram, FNP   20 mg at 07/28/19 0744  . magnesium hydroxide (MILK OF MAGNESIA) suspension 30 mL  30 mL Oral Daily PRN Money, Darnelle Maffucci B, FNP      . multivitamins with iron tablet 1 tablet  1 tablet Oral Daily , Madie Reno, MD   1 tablet at 07/28/19 0848  . neomycin-bacitracin-polymyxin (NEOSPORIN) ointment packet   Topical BID , Madie Reno, MD   1 application at 09/32/67 1720  . thiamine (VITAMIN B-1) tablet 250 mg  250 mg Oral Daily , Madie Reno, MD   250 mg at 07/28/19 0743  . traZODone (DESYREL) tablet 50 mg  50 mg Oral QHS PRN Money, Lowry Ram, FNP      . vitamin C (ASCORBIC ACID) tablet 500 mg  500 mg Oral Daily , Madie Reno, MD   500 mg at 07/28/19 1245    Lab Results:  Results for orders placed or performed during the hospital encounter of 07/26/19 (from the past 48 hour(s))  SARS Coronavirus 2 [facility] order, Performed in [facility] hospital lab) Nasopharyngeal Nasopharyngeal Swab     Status: None   Collection Time: 07/27/19 12:53 PM   Specimen: Nasopharyngeal Swab  Result Value Ref  Range   SARS Coronavirus 2 NEGATIVE NEGATIVE    Comment: (NOTE) If result is NEGATIVE SARS-CoV-2 target nucleic acids are NOT DETECTED. The SARS-CoV-2 RNA is generally detectable in upper and lower  respiratory specimens during the acute phase  of infection. The lowest  concentration of SARS-CoV-2 viral copies this assay can detect is 250  copies / mL. A negative result does not preclude SARS-CoV-2 infection  and should not be used as the sole basis for treatment or other  patient management decisions.  A negative result may occur with  improper specimen collection / handling, submission of specimen other  than nasopharyngeal swab, presence of viral mutation(s) within the  areas targeted by this assay, and inadequate number of viral copies  (<250 copies / mL). A negative result must be combined with clinical  observations, patient history, and epidemiological information. If result is POSITIVE SARS-CoV-2 target nucleic acids are DETECTED. The SARS-CoV-2 RNA is generally detectable in upper and lower  respiratory specimens dur ing the acute phase of infection.  Positive  results are indicative of active infection with SARS-CoV-2.  Clinical  correlation with patient history and other diagnostic information is  necessary to determine patient infection status.  Positive results do  not rule out bacterial infection or co-infection with other viruses. If result is PRESUMPTIVE POSTIVE SARS-CoV-2 nucleic acids MAY BE PRESENT.   A presumptive positive result was obtained on the submitted specimen  and confirmed on repeat testing.  While 2019 novel coronavirus  (SARS-CoV-2) nucleic acids may be present in the submitted sample  additional confirmatory testing may be necessary for epidemiological  and / or clinical management purposes  to differentiate between  SARS-CoV-2 and other Sarbecovirus currently known to infect humans.  If clinically indicated additional testing with an alternate test   methodology [phone]) is advised. The SARS-CoV-2 RNA is generally  detectable in upper and lower respiratory sp ecimens during the acute  phase of infection. The expected result is Negative. Fact Sheet for Patients:  StrictlyIdeas.no Fact Sheet for Healthcare Providers: BankingDealers.co.za This test is not yet approved or cleared by the [place] FDA and has been authorized for detection and/or diagnosis of SARS-CoV-2 by FDA under an Emergency Use Authorization (EUA).  This EUA will remain in effect (meaning this test can be used) for the duration of the COVID-19 declaration under Section 564(b)(1) of the Act, 21 U.S.C. section 360bbb-3(b)(1), unless the authorization is terminated or revoked sooner. Performed at [facility], [address] [place], [place] [address]     Blood Alcohol level:  Lab Results  Component Value Date   ETH 222 (H) 07/26/2019   ETH 175 (H) 56/43/3295    Metabolic Disorder Labs: Lab Results  Component Value Date   HGBA1C 5.1 03/12/2017   MPG 100 03/12/2017   No results found for: PROLACTIN Lab Results  Component Value Date   CHOL 144 03/12/2017   TRIG 107 03/12/2017   HDL 52 03/12/2017   CHOLHDL 2.8 03/12/2017   VLDL 21 03/12/2017   LDLCALC 71 03/12/2017   LDLCALC 70 06/27/2014    Physical Findings: AIMS:  , ,  ,  ,    CIWA:  CIWA-Ar Total: 3 COWS:     Musculoskeletal: Strength & Muscle Tone: within normal limits Gait & Station: normal Patient leans: N/A  Psychiatric Specialty Exam: Physical Exam  Nursing note and vitals reviewed. Constitutional: He appears well-developed and well-nourished.  HENT:  Head: Normocephalic and atraumatic.  Eyes: Pupils are equal, round, and reactive to light. Conjunctivae are normal.  Neck: Normal range of motion.  Cardiovascular: Regular rhythm and normal heart sounds.  Respiratory: Effort normal.  GI: Soft.  Musculoskeletal: Normal  range of motion.  Neurological: He is alert.  Skin:  Skin is warm and dry.     Psychiatric: His speech is normal. Judgment normal. His mood appears anxious. He is not agitated and not aggressive. Thought content is not paranoid. Cognition and memory are normal. He expresses no homicidal ideation.    Review of Systems  Constitutional: Negative.   HENT: Negative.   Eyes: Negative.   Respiratory: Negative.   Cardiovascular: Negative.   Gastrointestinal: Negative.   Musculoskeletal: Negative.   Skin: Negative.   Neurological: Negative.   Psychiatric/Behavioral: Positive for depression and substance abuse. Negative for hallucinations, memory loss and suicidal ideas. The patient is nervous/anxious and has insomnia.     Blood pressure (!) 157/98, pulse 84, temperature (!) 97 F (36.1 C), temperature source Oral, resp. rate 18, height 6' (1.829 m), weight 84.4 kg, SpO2 100 %.Body mass index is 25.23 kg/m.  General Appearance: Casual  Eye Contact:  Fair  Speech:  Slow  Volume:  Decreased  Mood:  Anxious  Affect:  Appropriate  Thought Process:  Goal Directed  Orientation:  Full (Time, Place, and Person)  Thought Content:  Logical  Suicidal Thoughts:  No  Homicidal Thoughts:  No  Memory:  Immediate;   Fair Recent;   Fair Remote;   Fair  Judgement:  Fair  Insight:  Fair  Psychomotor Activity:  Decreased  Concentration:  Concentration: Fair  Recall:  [employer] of Knowledge:  Fair  Language:  Fair  Akathisia:  No  Handed:  Right  AIMS (if indicated):     Assets:  Desire for Improvement Housing  ADL's:  Intact  Cognition:  WNL  Sleep:  Number of Hours: 3.5     Treatment Plan Summary: Daily contact with patient to assess and evaluate symptoms and progress in treatment, Medication management and Plan Follow-up for patient with alcohol abuse alcohol withdrawal depression and anxiety.  Patient is cooperative and engaged in treatment.  Met with liaison from [facility] today and is  expressing some positivity about follow-up.  Started citalopram for anxiety and depression and no side effects of that.  Encourage patient to attend groups.  Despite his saying he has no suicidal thoughts I think he remains at some high risk for his behavior especially if he returns to drinking and I told him I think he needs to be here through the weekend to get a break from his stress at home.  He did not have any complaint about that.  I have asked the hospitalist consultant to take a look at his forearm.  No other change to medicine.  He does have PRN medicine for sleep if needed.  Alethia Berthold, MD 07/28/2019, 5:31 PM

## 2019-07-28 NOTE — Plan of Care (Signed)
D: Pt. During assessments is pleasant and cooperative, but visibly anxious. Pt. Denies si/hi/avh, but appears passively suicidal. Pt. Endorses an anxious and depressed mood.   A: Q x 15 minute observation checks in place for safety. Patient was and is provided with education throughout shift.  Patient was and will be given/offered medications per orders. Patient was and is encouraged to attend groups, participate in unit activities and continue with plan of care. Pt. Chart and plans of care reviewed. Pt. Given support and encouragement.   R: Patient is complaint with medication and unit procedures, but group participation is absent. Pt. Is mostly isolative and withdrawn to his room throughout the day. Pt. Up for meals, observed eating fair.    Problem: Safety: Goal: Periods of time without injury will increase Outcome: Progressing   Problem: Activity: Goal: Interest or engagement in leisure activities will improve Outcome: Not Progressing

## 2019-07-28 NOTE — Plan of Care (Signed)
Patient is adjusting well in the unit, safety and expected behavior guide lines are discussed , appetite is good , mood and affect is good, denies any SI/HI/AVH no complains only require 15 minutes safety checks no distress,   Problem: Education: Goal: Knowledge of [facility] Education information/materials will improve Outcome: Progressing   Problem: Coping: Goal: Ability to verbalize frustrations and anger appropriately will improve Outcome: Progressing Goal: Ability to demonstrate self-control will improve Outcome: Progressing   Problem: Safety: Goal: Periods of time without injury will increase Outcome: Progressing   Problem: Education: Goal: Knowledge of the prescribed therapeutic regimen will improve Outcome: Progressing   Problem: Activity: Goal: Interest or engagement in leisure activities will improve Outcome: Progressing   Problem: Education: Goal: Knowledge of disease or condition will improve Outcome: Progressing   Problem: Health Behavior/Discharge Planning: Goal: Ability to identify changes in lifestyle to reduce recurrence of condition will improve Outcome: Progressing Goal: Identification of resources available to assist in meeting health care needs will improve Outcome: Progressing

## 2019-07-28 NOTE — BHH Counselor (Signed)
Adult Comprehensive Assessment  Patient ID: Miguel [name], male   DOB: May 02, 1961, 58 y.o.   MRN: [id number]  Information Source: Information source: Patient  Current Stressors:  Patient states their primary concerns and needs for treatment are:: Pt report "cutting my arm". Patient states their goals for this hospitilization and ongoing recovery are:: Pt reports "getting back to my handicap wife.  I take care of her 24/7."  Pt reports that his wifes aunt is taking care of her while he is hopsitalized. Educational / Learning stressors: Pt denies. Employment / Job issues: Pt denies. Family Relationships: Pt reports that he is the caregiver for his wife and only has an hour and a half 3x a week to himself. Financial / Lack of resources (include bankruptcy): Pt reports that he and his wife use his eifes disability check as means for the entire month. Housing / Lack of housing: Pt denies. Physical health (include injuries & life threatening diseases): Pt reports "I have gout, high blood pressure and and neck and back problems." Social relationships: Pt reports that he had one peer but that peer is a bad influence and he has not spoken with him in 6 months. Substance abuse: Pt reports alcohol use. Bereavement / Loss: Pt reports that his father passed 2 months ago.  Living/Environment/Situation:  Living Arrangements: Spouse/significant other Living conditions (as described by patient or guardian): Pt reports "they're good but I Mansur't go anywhere." Who else lives in the home?: Pt reports that he lives with his wife. How long has patient lived in current situation?: Pt reports "7 years roughly". What is atmosphere in current home: Other (Comment)(Pt reports "more stressful".)  Family History:  Marital status: Married Number of Years Married: 37 What types of issues is patient dealing with in the relationship?: Pt reports "she likes to say it's 'her' money." Are you sexually active?: No What is  your sexual orientation?: Heterosexual  Has your sexual activity been affected by drugs, alcohol, medication, or emotional stress?: Pt reports "we haven't had sex in 7 years because of her condition". Does patient have children?: No  Childhood History:  By whom was/is the patient raised?: Both parents Additional childhood history information: Patient reports that his upbringing changed when his parents divorced when he was 27. Description of patient's relationship with caregiver when they were a child: Had a great relationship with both parents  Patient's description of current relationship with people who raised him/her: Pt reports that father is deceased.  Pt reports a good relationship with mother. How were you disciplined when you got in trouble as a child/adolescent?: Pt reports "no TV". Does patient have siblings?: Yes Number of Siblings: 2 Description of patient's current relationship with siblings: Two brothers who are younger - has a good relationship with siblings  Did patient suffer any verbal/emotional/physical/sexual abuse as a child?: No Did patient suffer from severe childhood neglect?: No Has patient ever been sexually abused/assaulted/raped as an adolescent or adult?: No Was the patient ever a victim of a crime or a disaster?: No Witnessed domestic violence?: No  Education:  Highest grade of school patient has completed: 11th grade Currently a student?: No Learning disability?: No  Employment/Work Situation:   Employment situation: Unemployed Patient's job has been impacted by current illness: No What is the longest time patient has a held a job?: 20 years  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 If attempted suicide, did drugs/alcohol play a role in this?: No Alcohol/Substance Abuse Treatment Hx: Past Tx, Outpatient If yes, describe treatment: Pt reports past [facility] outpatient. He reports that he did not like the service. Has alcohol/substance abuse ever caused legal problems?: No  Social Support System:   Patient's Community Support System: Fair Describe Community Support System: Pt reports "my wife's aunt". Type of faith/religion: Methodist How does patient's faith help to cope with current illness?: Pt denies.  Leisure/Recreation:   Leisure and Hobbies: Pt denies.  Strengths/Needs:   What is the patient's perception of their strengths?: Pt reports "get along with people.  I am a people person." Patient states these barriers may affect/interfere with their treatment: Pt denies. Patient states these barriers may affect their return to the community: Pt denies.  Discharge Plan:   Currently receiving community mental health services: No Patient states concerns and preferences for aftercare planning are: Pt reports that he is open to outpatient referral to [facility]. Patient states they will know when they are safe and ready for discharge when: Pt reports "I feel good.  I feel safe." Does patient have access to transportation?: No Does patient have financial barriers related to discharge medications?: No Plan for no access to transportation at discharge: Pt reports that he will need transportation home. Will patient be returning to same living situation after discharge?: Yes  Summary/Recommendations:   Summary and Recommendations (to be completed by the evaluator): Patient is a 58 year old married male [facility] [place] [place] [facility]   He reports that he is unemployed and uninsured.  He presents to  the hospital following concerns for alcohol use and cutting his arm when upset with his wife.  He has a primary diagnosis of Major Depressive Disorder, recurrent episode, Severe.  Recommendations include: crisis stabilization, therapeutic milieu, encourage group attendance and participation, medication management for detox/mood stabilization and development of comprehensive mental wellness/sobriety plan.  Rozann Lesches. 07/28/2019

## 2019-07-28 NOTE — Progress Notes (Signed)
Recreation Therapy Notes  INPATIENT RECREATION THERAPY ASSESSMENT  Patient Details Name: Miguel [name] MRN: [id number] DOB: Jul 01, 1961 Today's Date: 07/28/2019       Information Obtained From: Patient  Able to Participate in Assessment/Interview: Yes  Patient Presentation: Responsive  Reason for Admission (Per Patient): Active Symptoms  Patient Stressors:    Coping Skills:   (None)  Leisure Interests (2+):  (None)  Frequency of Recreation/Participation:    Awareness of Community Resources:     [employer]:     Current Use:    If no, Barriers?:    Expressed Interest in [employer] Information:    [employer] of Residence:  Insurance underwriter  Patient Main Form of Transportation: Diplomatic Services operational officer  Patient Strengths:  Nice to others  Patient Identified Areas of Improvement:  a lot  Patient Goal for Hospitalization:  Getting home  Current SI (including self-harm):  No  Current HI:  No  Current AVH: No  Staff Intervention Plan: Group Attendance, Collaborate with Interdisciplinary Treatment Team  Consent to Intern Participation: N/A  Leida Luton 07/28/2019, 4:17 PM

## 2019-07-28 NOTE — BHH Suicide Risk Assessment (Signed)
[facility] INPATIENT:  Family/Significant Other Suicide Prevention Education  Suicide Prevention Education:  Patient Refusal for Family/Significant Other Suicide Prevention Education: The patient Miguel [name] has refused to provide written consent for family/significant other to be provided Family/Significant Other Suicide Prevention Education during admission and/or prior to discharge.  Physician notified.  SPE completed with pt, as pt refused to consent to family contact. SPI pamphlet provided to pt and pt was encouraged to share information with support network, ask questions, and talk about any concerns relating to SPE. Pt denies access to guns/firearms and verbalized understanding of information provided. Mobile Crisis information also provided to pt.   Rozann Lesches 07/28/2019, 10:09 AM

## 2019-07-28 NOTE — BHH Group Notes (Signed)
[facility] Group Notes:  (Nursing/MHT/Case Management/Adjunct)  Date:  07/28/2019  Time:  9:56 PM  Type of Therapy:  Group Therapy  Participation Level:  Did Not Attend   Nehemiah Settle 07/28/2019, 9:56 PM

## 2019-07-29 MED ORDER — COLCHICINE 0.6 MG PO TABS
0.6000 mg | ORAL_TABLET | Freq: Two times a day (BID) | ORAL | Status: DC
  Administered 2019-07-29 – 2019-08-01 (×6): 0.6 mg via ORAL
  Filled 2019-07-29 (×8): qty 1

## 2019-07-29 MED ORDER — PREDNISONE 50 MG PO TABS
50.0000 mg | ORAL_TABLET | Freq: Every day | ORAL | Status: AC
  Administered 2019-07-30 – 2019-07-31 (×2): 50 mg via ORAL
  Filled 2019-07-29 (×2): qty 1

## 2019-07-29 MED ORDER — PREDNISONE 50 MG PO TABS: 50.0000 mg | ORAL_TABLET | Freq: Every day | ORAL | Status: DC

## 2019-07-29 MED ORDER — IBUPROFEN 200 MG PO TABS
400.0000 mg | ORAL_TABLET | Freq: Three times a day (TID) | ORAL | Status: DC | PRN
  Administered 2019-07-29 – 2019-07-30 (×4): 400 mg via ORAL
  Filled 2019-07-29 (×4): qty 2

## 2019-07-29 MED ORDER — ACETAMINOPHEN 325 MG PO TABS
650.0000 mg | ORAL_TABLET | Freq: Four times a day (QID) | ORAL | Status: DC | PRN
  Administered 2019-07-31: 650 mg via ORAL
  Filled 2019-07-29: qty 2

## 2019-07-29 NOTE — Progress Notes (Signed)
[facility] NAME: Miguel [name]    MR#:  [id number]  DATE OF BIRTH:  [birth year]  SUBJECTIVE:  CHIEF COMPLAINT:  No chief complaint on file.  Medical consult for left upper extremity swelling and laceration.  Today patient have complaint of left knee pain.  REVIEW OF SYSTEMS:   CONSTITUTIONAL: No fever, fatigue or weakness.  EYES: No blurred or double vision.  EARS, NOSE, AND THROAT: No tinnitus or ear pain.  RESPIRATORY: No cough, shortness of breath, wheezing or hemoptysis.  CARDIOVASCULAR: No chest pain, orthopnea, edema.  GASTROINTESTINAL: No nausea, vomiting, diarrhea or abdominal pain.  GENITOURINARY: No dysuria, hematuria.  ENDOCRINE: No polyuria, nocturia,  HEMATOLOGY: No anemia, easy bruising or bleeding SKIN: No rash or lesion. MUSCULOSKELETAL: He have complaint of left knee pain today.   NEUROLOGIC: No tingling, numbness, weakness.  PSYCHIATRY: No anxiety or depression.   ROS  DRUG ALLERGIES:   Allergies  Allergen Reactions  . Percocet [Oxycodone-Acetaminophen] Nausea And Vomiting    VITALS:  Blood pressure (!) 157/98, pulse 84, temperature (!) 97 F (36.1 C), temperature source Oral, resp. rate 18, height 6' (1.829 m), weight 84.4 kg, SpO2 100 %.  PHYSICAL EXAMINATION:  GENERAL:  58 y.o.-year-old patient lying in the bed with no acute distress.  EYES: Pupils equal, round, reactive to light and accommodation. No scleral icterus. Extraocular muscles intact.  HEENT: Head atraumatic, normocephalic. Oropharynx and nasopharynx clear.  NECK:  Supple, no jugular venous distention. No thyroid enlargement, no tenderness.  LUNGS: Normal breath sounds bilaterally, no wheezing, rales,rhonchi or crepitation. No use of accessory muscles of respiration.  CARDIOVASCULAR: S1, S2 normal. No murmurs, rubs, or gallops.  ABDOMEN: Soft, nontender, nondistended. Bowel sounds present. No organomegaly or mass.  EXTREMITIES: Left forearm  and lower part of is swollen with some visible bruises and ecchymosis and scratch marks. Left knee does not appear swollen and has absolutely no redness and minimal tenderness on local touch and pressure. NEUROLOGIC: Cranial nerves II through XII are intact. Muscle strength 5/5 in all extremities except for left lower extremity he is not moving much due to complaint of pain in his knee. Sensation intact. Gait not checked.  PSYCHIATRIC: The patient is alert and oriented x 3.  SKIN: No obvious rash, lesion, or ulcer.        Physical Exam LABORATORY PANEL:   CBC Recent Labs  Lab 07/26/19 1356  WBC 6.8  HGB 12.1*  HCT 38.0*  PLT 241   ------------------------------------------------------------------------------------------------------------------  Chemistries  Recent Labs  Lab 07/26/19 1356  NA 141  K 3.6  CL 107  CO2 24  GLUCOSE 114*  BUN 13  CREATININE 1.07  CALCIUM 8.6*  AST 14*  ALT 16  ALKPHOS 68  BILITOT 0.8   ------------------------------------------------------------------------------------------------------------------  Cardiac Enzymes No results for input(s): TROPONINI in the last 168 hours. ------------------------------------------------------------------------------------------------------------------  RADIOLOGY:  No results found.  ASSESSMENT AND PLAN:   Principal Problem:   MDD (major depressive disorder), recurrent severe, without psychosis ([place]) Active Problems:   Gout   Self-inflicted laceration of wrist   Alcohol abuse   Social anxiety disorder   58 y.o. male with a known history of alcohol abuse, hypertension, GERD, gout, history of OCD who presented to the hospital yesterday secondary to a self-inflicted laceration and suicide attempt secondary to depression and he was also intoxicated.  Patient was admitted to the behavioral medicine unit and today noticed some left upper extremity swelling redness and therefore hospitalist  services  contacted for consultation.  1.  Left upper extremity redness and swelling-this is secondary to the self-inflicted laceration with surrounding hematoma.  Patient does have some significant bruising but no clinical evidence of worsening cellulitis. He does not appear to have any lymphangitic spread. - Continue supportive care to keep the arm elevated, advised nurse to give him a ice pack to apply to the swelling, empirically place him on Keflex for 5 days. - Today morning slightly worse swelling with minimal tenderness on local palpation but no warm to touch and he is still able to move his fingers and wrist with full range without much pain. I would continue to monitor, if his movements get affected or more painful then we might have to worry about compartment syndrome, currently does not feel like that.  2.  Alcohol abuse- continue Librium taper as patient is high risk for alcohol withdrawal. -Continue thiamine supplements.  3.  Essential hypertension-continue lisinopril.  4.  Depression-continue Celexa.  5.    Acute gout- now he complains of left knee pain and not able to move much. Continue allopurinol, added colchicine and ibuprofen for pain control. Start oral prednisone.    All the records are reviewed and case discussed with Care Management/Social Workerr. Management plans discussed with the patient, family and they are in agreement.  CODE STATUS: Full  TOTAL TIME TAKING CARE OF THIS PATIENT: 35 minutes.     POSSIBLE D/C IN 2-3 DAYS, DEPENDING ON CLINICAL CONDITION.   Vaughan Basta M.D on 07/29/2019   Between 7am to 6pm - Pager - [phone]  After 6pm go to [email or web address] - password [email or web address] [facility]  Office  [phone]  CC: Primary care physician; Clinic[facility]  Note: This dictation was prepared with Dragon dictation along with smaller phrase technology. Any transcriptional errors that result from this process are  unintentional.

## 2019-07-29 NOTE — Progress Notes (Signed)
Patient alert and orient. Cooperative with care. Only complains of gout pain. Endorses depression. New meds given to assist with gout pain. Patient reports no relief. Isolative to self and room. Denies SH, HI, AVH. Did not attend group. Up for meals in dayroom. Encouragement and support offered. Safety checks maintained. Pt receptive and remains safe on unit with q 15 min checks.

## 2019-07-29 NOTE — Progress Notes (Addendum)
[facility] MD Progress Note  07/29/2019 9:28 AM Miguel [name]  MRN:  [id number]   Subjective:  Miguel [name] reported " I am feeling better this morning I woke up and was unable to walk due to gout in my knees."  Evaluation: Patient observed resting in bed.  He is awake alert and oriented x3.  Denying suicidal or homicidal ideations.  Denies auditory or visual hallucinations.  Reports a verbal altercation between he and his wife caused him to become stressed and overwhelmed.  He reports " I know I have a drinking problem and I know that is the only time I try to hurt myself. "  Patient is currently denying withdrawal symptoms i.e. headaches,nausea/vomiting or tremors.  Patient reports he has plans to follow-up with [facility] facility at discharge.  Philips reports drinking " airplane shooters."  Every other day. Dyan states he has attempted to stop drinking on his own, and knows he needs help. Rating his depression 3/10 with 10 being the worst.  Patient reports he is receptive to outpatient treatment.  BAL on admission was 222. AST/ALT wnl. -Detox protocol completed on 9/11.  PRN Librium made available. [place] reported his appetite is improving.  States he is resting well throughout the night.  Support, encouragement and reassurance was provided.    Principal Problem: MDD (major depressive disorder), recurrent severe, without psychosis ([place]) Diagnosis: Principal Problem:   MDD (major depressive disorder), recurrent severe, without psychosis ([place]) Active Problems:   Gout   Self-inflicted laceration of wrist   Alcohol abuse   Social anxiety disorder  Total Time spent with patient: 15 minutes  Past Psychiatric History:   Past Medical History:  Past Medical History:  Diagnosis Date  . Alcohol abuse   . Asthma   . GERD (gastroesophageal reflux disease)   . Gout   . Hypertension   . Kidney stone   . OCD (obsessive compulsive disorder)   . Renal colic     Past Surgical History:  Procedure  Laterality Date  . CHOLECYSTECTOMY  2012  . EXTRACORPOREAL SHOCK WAVE LITHOTRIPSY Left 01/12/2019   Procedure: EXTRACORPOREAL SHOCK WAVE LITHOTRIPSY (ESWL);  Surgeon: Billey Co, MD;  Location: [facility] ORS;  Service: Urology;  Laterality: Left;   Family History:  Family History  Problem Relation Age of Onset  . Alcohol abuse Father   . Breast cancer Mother 96   Family Psychiatric  History:  Social History:  Social History   Substance and Sexual Activity  Alcohol Use Yes  . Alcohol/week: 2.0 standard drinks  . Types: 2 Glasses of wine per week   Comment: daily     Social History   Substance and Sexual Activity  Drug Use No    Social History   Socioeconomic History  . Marital status: Married    Spouse name: Not on file  . Number of children: Not on file  . Years of education: Not on file  . Highest education level: Not on file  Occupational History  . Not on file  Social Needs  . Financial resource strain: Very hard  . Food insecurity    Worry: Never true    Inability: Never true  . Transportation needs    Medical: No    Non-medical: No  Tobacco Use  . Smoking status: Former Research scientist (life sciences)  . Smokeless tobacco: Never Used  . Tobacco comment: quit 30 years ago  Substance and Sexual Activity  . Alcohol use: Yes    Alcohol/week: 2.0 standard drinks  Types: 2 Glasses of wine per week    Comment: daily  . Drug use: No  . Sexual activity: Yes  Lifestyle  . Physical activity    Days per week: 2 days    Minutes per session: 10 min  . Stress: Very much  Relationships  . Social Herbalist on phone: Once a week    Gets together: Not on file    Attends religious service: Never    Active member of club or organization: No    Attends meetings of clubs or organizations: Never    Relationship status: Married  Other Topics Concern  . Not on file  Social History Narrative   Lives at home with his wife and takes care of his wife. Independent at baseline       - Biggest strain is financial but doesn't think they could got get more help.      Patient expressed interest in possible financial assistance. Informed written consent obtained   Additional Social History:                         Sleep: Fair  Appetite:  Fair  Current Medications: Current Facility-Administered Medications  Medication Dose Route Frequency Provider Last Rate Last Dose  . allopurinol (ZYLOPRIM) tablet 300 mg  300 mg Oral Daily Clapacs, Madie Reno, MD   300 mg at 07/29/19 0835  . alum & mag hydroxide-simeth (MAALOX/MYLANTA) 200-200-20 MG/5ML suspension 30 mL  30 mL Oral Q4H PRN Money, Lowry Ram, FNP      . cephALEXin (KEFLEX) capsule 500 mg  500 mg Oral Q8H Henreitta Leber, MD   500 mg at 07/28/19 2113  . chlordiazePOXIDE (LIBRIUM) capsule 25 mg  25 mg Oral Q4H PRN Clapacs, Madie Reno, MD   25 mg at 07/28/19 0848  . citalopram (CELEXA) tablet 20 mg  20 mg Oral Daily Clapacs, Madie Reno, MD   20 mg at 07/29/19 0836  . folic acid (FOLVITE) tablet 1 mg  1 mg Oral Daily Clapacs, Madie Reno, MD   1 mg at 07/29/19 0836  . hydrOXYzine (ATARAX/VISTARIL) tablet 25 mg  25 mg Oral TID PRN Money, Lowry Ram, FNP      . lisinopril (ZESTRIL) tablet 20 mg  20 mg Oral Daily Money, Lowry Ram, FNP   20 mg at 07/29/19 0836  . magnesium hydroxide (MILK OF MAGNESIA) suspension 30 mL  30 mL Oral Daily PRN Money, Darnelle Maffucci B, FNP      . multivitamins with iron tablet 1 tablet  1 tablet Oral Daily Clapacs, Madie Reno, MD   1 tablet at 07/29/19 0835  . neomycin-bacitracin-polymyxin (NEOSPORIN) ointment packet   Topical BID Clapacs, John T, MD      . thiamine (VITAMIN B-1) tablet 250 mg  250 mg Oral Daily Clapacs, Madie Reno, MD   250 mg at 07/29/19 0835  . traZODone (DESYREL) tablet 50 mg  50 mg Oral QHS PRN Money, Lowry Ram, FNP   50 mg at 07/28/19 2115  . vitamin C (ASCORBIC ACID) tablet 500 mg  500 mg Oral Daily Clapacs, Madie Reno, MD   500 mg at 07/29/19 [id number]    Lab Results:  Results for orders placed or performed  during the hospital encounter of 07/27/19 (from the past 48 hour(s))  CK     Status: Abnormal   Collection Time: 07/28/19  7:43 PM  Result Value Ref Range   Total CK 36 (L) 49 -  397 U/L    Comment: Performed at [facility], [address] [place], [place] [address]    Blood Alcohol level:  Lab Results  Component Value Date   ETH 222 (H) 07/26/2019   ETH 175 (H) [id number]    Metabolic Disorder Labs: Lab Results  Component Value Date   HGBA1C 5.1 03/12/2017   MPG 100 03/12/2017   No results found for: PROLACTIN Lab Results  Component Value Date   CHOL 144 03/12/2017   TRIG 107 03/12/2017   HDL 52 03/12/2017   CHOLHDL 2.8 03/12/2017   VLDL 21 03/12/2017   LDLCALC 71 03/12/2017   LDLCALC 70 06/27/2014    Physical Findings: AIMS:  , ,  ,  ,    CIWA:  CIWA-Ar Total: 3 COWS:     Musculoskeletal: Strength & Muscle Tone: N/A Gait & Station: N/A Patient leans: N/A  Psychiatric Specialty Exam: Physical Exam  Vitals reviewed. Skin: Skin is warm.  Psychiatric: He has a normal mood and affect. His behavior is normal.    Review of Systems  Musculoskeletal: Positive for joint pain.  Skin:       Multiple superficial lacerations noted to left forearm.  2x2 cm puncture/ laceration sutures noted clean dry and intact. Bruising and edema noted to left wrist to the mid forearm.   Psychiatric/Behavioral: Positive for depression and suicidal ideas. The patient is nervous/anxious.   All other systems reviewed and are negative.   Blood pressure (!) 157/98, pulse 84, temperature (!) 97 F (36.1 C), temperature source Oral, resp. rate 18, height 6' (1.829 m), weight 84.4 kg, SpO2 100 %.Body mass index is 25.23 kg/m.  General Appearance: Casual  Eye Contact:  Good  Speech:  Clear and Coherent  Volume:  Normal  Mood:  Depressed  Affect:  Congruent  Thought Process:  Coherent  Orientation:  Full (Time, Place, and Person)  Thought Content:  Logical and Rumination   Suicidal Thoughts:  No  Homicidal Thoughts:  No  Memory:  Immediate;   Fair Recent;   Fair  Judgement:  Fair  Insight:  Fair  Psychomotor Activity:  Normal  Concentration:  Concentration: Fair  Recall:  [employer] of Knowledge:  Fair  Language:  Good  Akathisia:  No  Handed:  Right  AIMS (if indicated):     Assets:  Communication Skills Desire for Improvement Social Support Transportation  ADL's:  Intact  Cognition:  WNL  Sleep:  Number of Hours: 6     Treatment Plan Summary: Daily contact with patient to assess and evaluate symptoms and progress in treatment and Medication management   Major depressive disorder:  Continue Celexa 20 mg p.o. daily Continue trazodone 50 mg p.o. nightly Continue hydroxyzine 25 mg p.o. 3 times daily as needed  Medical concerns:  Continue allopurinol 300 mg p.o. daily Continue Keflex 500 mg p.o. 3 times daily x5 days Continue Neosporin PRN  CSW to continue working on discharge disposition Patient encouraged to participate within the therapeutic milieu  Derrill [place], NP 07/29/2019, 9:28 AM   Patient is seen and examined.  Patient was seen in treatment team.  Patient was interviewed at that time.  Discussed above evaluation as well as plan with Ms. Lewis as outlined above.  Agree with that plan and treatment.

## 2019-07-29 NOTE — Plan of Care (Signed)
Patient reports that depression symptoms continues unchanged , described feeling sad and instances of impulsive behavior and poor concentrations , patient is not attending groups , but is compliant with his medications with complains, described sleep as good , and poor appetite for food, denies any SI/HI/AVH no signs of delusions, education and support is provided , encouraged group participation with peers and develop coping skills to improve mental and emotional state of mind , patient acknowledged information provided, 15 minutes safety checks is in progress.   Problem: Education: Goal: Knowledge of [facility] Education information/materials will improve Outcome: Progressing   Problem: Coping: Goal: Ability to verbalize frustrations and anger appropriately will improve Outcome: Progressing Goal: Ability to demonstrate self-control will improve Outcome: Progressing   Problem: Safety: Goal: Periods of time without injury will increase Outcome: Progressing   Problem: Education: Goal: Knowledge of the prescribed therapeutic regimen will improve Outcome: Progressing   Problem: Activity: Goal: Interest or engagement in leisure activities will improve Outcome: Progressing   Problem: Education: Goal: Knowledge of disease or condition will improve Outcome: Progressing   Problem: Health Behavior/Discharge Planning: Goal: Ability to identify changes in lifestyle to reduce recurrence of condition will improve Outcome: Progressing Goal: Identification of resources available to assist in meeting health care needs will improve Outcome: Progressing

## 2019-07-29 NOTE — Treatment Plan (Signed)
Interdisciplinary Treatment and Diagnostic Plan Update  07/29/2019 Time of Session: 9:17 AM  JOHNHENRY [name] MRN: [id number]  Principal Diagnosis: MDD (major depressive disorder), recurrent severe, without psychosis ([place])  Secondary Diagnoses: Principal Problem:   MDD (major depressive disorder), recurrent severe, without psychosis ([place]) Active Problems:   Gout   Self-inflicted laceration of wrist   Alcohol abuse   Social anxiety disorder   Current Medications:  Current Facility-Administered Medications  Medication Dose Route Frequency Provider Last Rate Last Dose  . allopurinol (ZYLOPRIM) tablet 300 mg  300 mg Oral Daily Clapacs, Madie Reno, MD   300 mg at 07/29/19 0835  . alum & mag hydroxide-simeth (MAALOX/MYLANTA) 200-200-20 MG/5ML suspension 30 mL  30 mL Oral Q4H PRN Money, Lowry Ram, FNP      . cephALEXin (KEFLEX) capsule 500 mg  500 mg Oral Q8H Henreitta Leber, MD   500 mg at 07/28/19 2113  . chlordiazePOXIDE (LIBRIUM) capsule 25 mg  25 mg Oral Q4H PRN Clapacs, Madie Reno, MD   25 mg at 07/28/19 0848  . citalopram (CELEXA) tablet 20 mg  20 mg Oral Daily Clapacs, Madie Reno, MD   20 mg at 07/29/19 0836  . folic acid (FOLVITE) tablet 1 mg  1 mg Oral Daily Clapacs, Madie Reno, MD   1 mg at 07/29/19 0836  . hydrOXYzine (ATARAX/VISTARIL) tablet 25 mg  25 mg Oral TID PRN Money, Lowry Ram, FNP      . lisinopril (ZESTRIL) tablet 20 mg  20 mg Oral Daily Money, Lowry Ram, FNP   20 mg at 07/29/19 0836  . magnesium hydroxide (MILK OF MAGNESIA) suspension 30 mL  30 mL Oral Daily PRN Money, Darnelle Maffucci B, FNP      . multivitamins with iron tablet 1 tablet  1 tablet Oral Daily Clapacs, Madie Reno, MD   1 tablet at 07/29/19 0835  . neomycin-bacitracin-polymyxin (NEOSPORIN) ointment packet   Topical BID Clapacs, Miguel T, MD      . thiamine (VITAMIN B-1) tablet 250 mg  250 mg Oral Daily Clapacs, Madie Reno, MD   250 mg at 07/29/19 0835  . traZODone (DESYREL) tablet 50 mg  50 mg Oral QHS PRN Money, Lowry Ram, FNP   50 mg at 07/28/19  2115  . vitamin C (ASCORBIC ACID) tablet 500 mg  500 mg Oral Daily Clapacs, Madie Reno, MD   500 mg at 07/29/19 7741    PTA Medications: Medications Prior to Admission  Medication Sig Dispense Refill Last Dose  . albuterol (VENTOLIN HFA) 108 (90 Base) MCG/ACT inhaler Inhale 2 puffs into the lungs every 4 (four) hours as needed. (Patient taking differently: Inhale 2 puffs into the lungs every 4 (four) hours as needed for wheezing or shortness of breath. ) 72 g 0   . allopurinol (ZYLOPRIM) 300 MG tablet Take 1 tablet (300 mg total) by mouth daily. 90 tablet 3   . cefUROXime (CEFTIN) 250 MG tablet Take 1 tablet (250 mg total) by mouth 2 (two) times daily for 10 days. 20 tablet 0   . chlordiazePOXIDE (LIBRIUM) 25 MG capsule Take 1 capsule (25 mg total) by mouth 3 (three) times daily as needed for withdrawal. Additional taper in discharge instructions. 15 capsule 0   . colchicine 0.6 MG tablet Take 1 tablet (0.6 mg total) by mouth daily. (Patient taking differently: Take 0.6 mg by mouth daily as needed. ) 4 tablet 0   . diclofenac sodium (VOLTAREN) 1 % GEL Apply 2 g topically 4 (four) times  daily. 50 g 0   . docusate sodium (COLACE) 100 MG capsule Take 1 tablet once or twice daily as needed for constipation while taking narcotic pain medicine 30 capsule 0   . Fluticasone-Salmeterol (ADVAIR DISKUS) 100-50 MCG/DOSE AEPB Inhale 1 puff into the lungs 2 (two) times daily. 161 each 0   . folic acid (FOLVITE) 1 MG tablet Take 1 tablet (1 mg total) by mouth daily. 90 tablet 3   . gabapentin (NEURONTIN) 100 MG capsule Take 2 capsules (200 mg total) by mouth 3 (three) times daily. 90 capsule 2   . lisinopril (PRINIVIL,ZESTRIL) 20 MG tablet Take 1 tablet (20 mg total) by mouth daily. 30 tablet 1   . methocarbamol (ROBAXIN) 500 MG tablet Take 1 tablet (500 mg total) by mouth 4 (four) times daily. 20 tablet 0   . Multiple Vitamins-Minerals (MULTIVITAMIN WITH MINERALS) tablet Take 1 tablet by mouth daily. 90 tablet 3    . promethazine (PHENERGAN) 12.5 MG tablet Take 1 tablet (12.5 mg total) by mouth every 6 (six) hours as needed. 12 tablet 0   . tiZANidine (ZANAFLEX) 4 MG tablet Take 0.5 tablets (2 mg total) by mouth every 8 (eight) hours as needed for muscle spasms. 30 tablet 1     Patient Stressors: Financial difficulties Health problems Marital or family conflict Occupational concerns Substance abuse  Patient Strengths: Capable of independent living Communication skills  Treatment Modalities: Medication Management, Group therapy, Case management,  1 to 1 session with clinician, Psychoeducation, Recreational therapy.   Physician Treatment Plan for Primary Diagnosis: MDD (major depressive disorder), recurrent severe, without psychosis ([place]) Long Term Goal(s): Improvement in symptoms so as ready for discharge  Short Term Goals: Ability to verbalize feelings will improve Ability to disclose and discuss suicidal ideas Ability to demonstrate self-control will improve Ability to maintain clinical measurements within normal limits will improve Compliance with prescribed medications will improve  Medication Management: Evaluate patient's response, side effects, and tolerance of medication regimen.  Therapeutic Interventions: 1 to 1 sessions, Unit Group sessions and Medication administration.  Evaluation of Outcomes: Progressing  Physician Treatment Plan for Secondary Diagnosis: Principal Problem:   MDD (major depressive disorder), recurrent severe, without psychosis ([place]) Active Problems:   Gout   Self-inflicted laceration of wrist   Alcohol abuse   Social anxiety disorder   Long Term Goal(s): Improvement in symptoms so as ready for discharge  Short Term Goals: Ability to verbalize feelings will improve Ability to disclose and discuss suicidal ideas Ability to demonstrate self-control will improve Ability to maintain clinical measurements within normal limits will improve Compliance with  prescribed medications will improve  Medication Management: Evaluate patient's response, side effects, and tolerance of medication regimen.  Therapeutic Interventions: 1 to 1 sessions, Unit Group sessions and Medication administration.  Evaluation of Outcomes: Progressing   RN Treatment Plan for Primary Diagnosis: MDD (major depressive disorder), recurrent severe, without psychosis ([place]) Long Term Goal(s): Knowledge of disease and therapeutic regimen to maintain health will improve  Short Term Goals: Ability to identify and develop effective coping behaviors will improve and Compliance with prescribed medications will improve  Medication Management: RN will administer medications as ordered by provider, will assess and evaluate patient's response and provide education to patient for prescribed medication. RN will report any adverse and/or side effects to prescribing provider.  Therapeutic Interventions: 1 on 1 counseling sessions, Psychoeducation, Medication administration, Evaluate responses to treatment, Monitor vital signs and CBGs as ordered, Perform/monitor CIWA, COWS, AIMS and Fall Risk screenings as  ordered, Perform wound care treatments as ordered.  Evaluation of Outcomes: Progressing   LCSW Treatment Plan for Primary Diagnosis: MDD (major depressive disorder), recurrent severe, without psychosis ([place]) Long Term Goal(s): Safe transition to appropriate next level of care at discharge, Engage patient in therapeutic group addressing interpersonal concerns.  Short Term Goals: Engage patient in aftercare planning with referrals and resources  Therapeutic Interventions: Assess for all discharge needs, 1 to 1 time with Social worker, Explore available resources and support systems, Assess for adequacy in community support network, Educate family and significant other(s) on suicide prevention, Complete Psychosocial Assessment, Interpersonal group therapy.  Evaluation of Outcomes: Met   Return home, follow up SAIOP    Progress in Treatment: Attending groups: No Participating in groups: No Taking medication as prescribed: Yes Toleration medication: Yes, no side effects reported at this time Family/Significant other contact made:No  Patient would not give permission  Patient understands diagnosis: Yes AEB asking for help with addiction Discussing patient identified problems/goals with staff: Yes Medical problems stabilized or resolved: Yes Denies suicidal/homicidal ideation: Yes Issues/concerns per patient self-inventory: None Other: N/A  New problem(s) identified: None identified at this time.   New Short Term/Long Term Goal(s): "I want to get better, and not come back here again.  I talked to my wife on the phone, and I need to stop drinking.".   Discharge Plan or Barriers:   Reason for Continuation of Hospitalization:  Medical Issues-Gout Medication stabilization  Withdrawal symptoms  Estimated Length of Stay: 3-5 days  Attendees: Patient: Miguel [name] 07/29/2019  9:17 AM  Physician: , Myles Lipps MD 07/29/2019  9:17 AM  Nursing:  Alyson Locket RN 07/29/2019  9:17 AM  Other: Ludger Nutting PA   Social Worker: Ripley Fraise 07/29/2019  9:17 AM  Recreational Therapist:  07/29/2019  9:17 AM          Scribe for Treatment Team:  Roque Lias LCSW 07/29/2019 9:17 AM

## 2019-07-29 NOTE — Progress Notes (Signed)
Patient alert and oriented x 4, affect is flat and sad he appears worried like an impending dome is about to happen he stated " I am worried about my wife, life in general,  money you know, you understand me?" Writer reassured him and asked him to worry less gave him some emotional support and encouragement , he was receptive and was calm. Patient's  thoughts are organized and coherent non tangential, he currently denies SI/HI/AVH, he took his night time scheduled medication and he was noted interacting appropriately with peers and staff. 15 minutes safety checks maintained will continue to monitor.

## 2019-07-29 NOTE — Plan of Care (Signed)
  Problem: Education: Goal: Knowledge of [facility] Education information/materials will improve Outcome: Progressing   Problem: Coping: Goal: Ability to verbalize frustrations and anger appropriately will improve Outcome: Progressing   Problem: Safety: Goal: Periods of time without injury will increase Outcome: Progressing

## 2019-07-30 MED ORDER — INFLUENZA VAC SPLIT QUAD 0.5 ML IM SUSY
0.5000 mL | PREFILLED_SYRINGE | INTRAMUSCULAR | Status: AC
  Administered 2019-08-01: 0.5 mL via INTRAMUSCULAR
  Filled 2019-07-30: qty 0.5

## 2019-07-30 MED ORDER — LISINOPRIL 20 MG PO TABS
40.0000 mg | ORAL_TABLET | Freq: Every day | ORAL | Status: DC
  Administered 2019-07-31 – 2019-08-01 (×2): 40 mg via ORAL
  Filled 2019-07-30 (×2): qty 2

## 2019-07-30 MED ORDER — OXYCODONE-ACETAMINOPHEN 5-325 MG PO TABS
1.0000 | ORAL_TABLET | Freq: Four times a day (QID) | ORAL | Status: DC | PRN
  Administered 2019-07-30 – 2019-07-31 (×4): 1 via ORAL
  Filled 2019-07-30 (×4): qty 1

## 2019-07-30 NOTE — Progress Notes (Signed)
Patient reporting pain is not subsiding with current medications, will notify attending this morning during treatment team meeting for orders and review.

## 2019-07-30 NOTE — BHH Group Notes (Signed)
LCSW Group Therapy Note  07/30/2019 10:00 AM   Type of Therapy and Topic: Group Therapy: Feelings Around Returning Home & Establishing a Supportive Framework and Supporting Oneself When Supports Not Available   Participation Level:  Did Not Attend   Description of Group:  Patients first processed thoughts and feelings about upcoming discharge. These included fears of upcoming changes, lack of change, new living environments, judgements and expectations from others and overall stigma of mental health issues. The group then discussed the definition of a supportive framework, what that looks and feels like, and how do to discern it from an unhealthy non-supportive network. The group identified different types of supports as well as what to do when your family/friends are less than helpful or unavailable   Therapeutic Goals  1. Patient will identify one healthy supportive network that they can use at discharge. 2. Patient will identify one factor of a supportive framework and how to tell it from an unhealthy network. 3. Patient able to identify one coping skill to use when they do not have positive supports from others. 4. Patient will demonstrate ability to communicate their needs through discussion and/or role plays.   Summary of Patient Progress:  x    Therapeutic Modalities Cognitive Behavioral Therapy Motivational Interviewing    [name], MSW, LCSW Clinical Social Work 07/30/2019 10:00 AM

## 2019-07-30 NOTE — Progress Notes (Signed)
[facility] NAME: Miguel [name]    MR#:  [id number]  DATE OF BIRTH:  06-07-1961  SUBJECTIVE:  CHIEF COMPLAINT:  No chief complaint on file.  Medical consult for left upper extremity swelling and laceration.  Today patient have complaint of both knee pain.  But said it feels better after getting oxycodone. Left upper extremity skin discoloration is spreading due to dependent swelling and hematoma.  REVIEW OF SYSTEMS:   CONSTITUTIONAL: No fever, fatigue or weakness.  EYES: No blurred or double vision.  EARS, NOSE, AND THROAT: No tinnitus or ear pain.  RESPIRATORY: No cough, shortness of breath, wheezing or hemoptysis.  CARDIOVASCULAR: No chest pain, orthopnea, edema.  GASTROINTESTINAL: No nausea, vomiting, diarrhea or abdominal pain.  GENITOURINARY: No dysuria, hematuria.  ENDOCRINE: No polyuria, nocturia,  HEMATOLOGY: No anemia, easy bruising or bleeding SKIN: No rash or lesion. MUSCULOSKELETAL: He have complaint of left knee pain today.   NEUROLOGIC: No tingling, numbness, weakness.  PSYCHIATRY: No anxiety or depression.   ROS  DRUG ALLERGIES:   Allergies  Allergen Reactions  . Percocet [Oxycodone-Acetaminophen] Nausea And Vomiting    VITALS:  Blood pressure (!) 157/98, pulse 84, temperature (!) 97 F (36.1 C), temperature source Oral, resp. rate 18, height 6' (1.829 m), weight 84.4 kg, SpO2 100 %.  PHYSICAL EXAMINATION:  GENERAL:  58 y.o.-year-old patient lying in the bed with no acute distress.  EYES: Pupils equal, round, reactive to light and accommodation. No scleral icterus. Extraocular muscles intact.  HEENT: Head atraumatic, normocephalic. Oropharynx and nasopharynx clear.  NECK:  Supple, no jugular venous distention. No thyroid enlargement, no tenderness.  LUNGS: Normal breath sounds bilaterally, no wheezing, rales,rhonchi or crepitation. No use of accessory muscles of respiration.  CARDIOVASCULAR: S1, S2 normal.  No murmurs, rubs, or gallops.  ABDOMEN: Soft, nontender, nondistended. Bowel sounds present. No organomegaly or mass.  EXTREMITIES: Left forearm and lower part of is swollen with some visible bruises and ecchymosis and scratch marks. Left knee does not appear swollen and has absolutely no redness and minimal tenderness on local touch and pressure. NEUROLOGIC: Cranial nerves II through XII are intact. Muscle strength 5/5 in all extremities except for left lower extremity he is not moving much due to complaint of pain in his knee. Sensation intact. Gait not checked.  PSYCHIATRIC: The patient is alert and oriented x 3.  SKIN: No obvious rash, lesion, or ulcer.            Physical Exam LABORATORY PANEL:   CBC Recent Labs  Lab 07/26/19 1356  WBC 6.8  HGB 12.1*  HCT 38.0*  PLT 241   ------------------------------------------------------------------------------------------------------------------  Chemistries  Recent Labs  Lab 07/26/19 1356  NA 141  K 3.6  CL 107  CO2 24  GLUCOSE 114*  BUN 13  CREATININE 1.07  CALCIUM 8.6*  AST 14*  ALT 16  ALKPHOS 68  BILITOT 0.8   ------------------------------------------------------------------------------------------------------------------  Cardiac Enzymes No results for input(s): TROPONINI in the last 168 hours. ------------------------------------------------------------------------------------------------------------------  RADIOLOGY:  No results found.  ASSESSMENT AND PLAN:   Principal Problem:   MDD (major depressive disorder), recurrent severe, without psychosis ([place]) Active Problems:   Gout   Self-inflicted laceration of wrist   Alcohol abuse   Social anxiety disorder   58 y.o. male with a known history of alcohol abuse, hypertension, GERD, gout, history of OCD who presented to the hospital yesterday secondary to a self-inflicted laceration and suicide attempt secondary to depression  and he was also  intoxicated.  Patient was admitted to the behavioral medicine unit and today noticed some left upper extremity swelling redness and therefore hospitalist services contacted for consultation.  1.  Left upper extremity redness and swelling-this is secondary to the self-inflicted laceration with surrounding hematoma.  Patient does have some significant bruising but no clinical evidence of worsening cellulitis. He does not appear to have any lymphangitic spread. - Continue supportive care to keep the arm elevated, advised nurse to give him a ice pack to apply to the swelling, empirically place him on Keflex for 5 days. - Today morning slightly worse swelling with minimal tenderness on local palpation but no warm to touch and he is still able to move his fingers and wrist with full range without much pain. I would continue to monitor, if his movements get affected or more painful then we might have to worry about compartment syndrome, currently does not feel like that.  2.  Alcohol abuse- continue Librium taper as patient is high risk for alcohol withdrawal. -Continue thiamine supplements. No signs of alcohol withdrawal currently  3.  Essential hypertension-continue lisinopril.  4.  Depression-continue Celexa.  Manage per psychiatric team.  5.    Acute gout- now he complains of both knee pain and not able to move much. Continue allopurinol, added colchicine and ibuprofen for pain control. Start oral prednisone. He still had complain of significant pain so I have started on oxycodone which seems to be helping for pain control.    All the records are reviewed and case discussed with Care Management/Social Workerr. Management plans discussed with the patient, family and they are in agreement.  CODE STATUS: Full  TOTAL TIME TAKING CARE OF THIS PATIENT: 35 minutes.     POSSIBLE D/C IN 2-3 DAYS, DEPENDING ON CLINICAL CONDITION.   Vaughan Basta M.D on 07/30/2019   Between 7am to  6pm - Pager - [phone]  After 6pm go to [email or web address] - password [email or web address] [facility]  Office  [phone]  CC: Primary care physician; [facility], [facility]  Note: This dictation was prepared with Dragon dictation along with smaller phrase technology. Any transcriptional errors that result from this process are unintentional.

## 2019-07-30 NOTE — Plan of Care (Addendum)
D: Pt during assessments denies SI/HI/AVH, able to contract for safety. Pt is pleasant and cooperative, engagement is good. Pt. Complains of pain, provider is aware and is being addressed. Pt. Interaction is appropriate. Pt. Is visibly mildly anxious, but describes a mostly normal mood. Pt. Today preoccupied with physical pain, endorsing emotionally he is improving.   A: Q x 15 minute observation checks in place for safety. Patient was and is provided with education throughout shift.  Patient was and will be given/offered medications per orders. Patient was and is encouraged to attend groups, participate in unit activities and continue with plan of care. Pt. Chart and plans of care reviewed. Pt. Given support and encouragement.   R: Patient is complaint with medication and unit procedures. Pt. Up at times seen in the day room, but otherwise has been resting a lot in bed. Pt. Observed eating good. Pt. Declines utilization of walker for ambulation, but agrees to shower chair while attending to hygiene today. Pt. Ambulation has been steady, just slow. Pt. Arm with lacerations from prior to admissions cleaned and assessed.   Problem: Education: Goal: Knowledge of  General Education information/materials will improve Outcome: Progressing   Problem: Coping: Goal: Ability to verbalize frustrations and anger appropriately will improve Outcome: Progressing Goal: Ability to demonstrate self-control will improve Outcome: Progressing   Problem: Safety: Goal: Periods of time without injury will increase Outcome: Progressing   Problem: Education: Goal: Knowledge of the prescribed therapeutic regimen will improve Outcome: Progressing   Problem: Activity: Goal: Interest or engagement in leisure activities will improve Outcome: Progressing   Problem: Education: Goal: Knowledge of disease or condition will improve Outcome: Progressing   Problem: Health Behavior/Discharge Planning: Goal:  Ability to identify changes in lifestyle to reduce recurrence of condition will improve Outcome: Progressing Goal: Identification of resources available to assist in meeting health care needs will improve Outcome: Progressing

## 2019-07-30 NOTE — Progress Notes (Signed)
D: Patient continues to be fixated on his pain medications, what they are, and what time he will receive them. Continues to complain of gout pain in knees that is chronic. Denies SI, HI and AVH. No symptoms of withdrawal. Able to contract for safety. Requesting to be awakened to be given his prn oxycontin the next time it is due, although he reports improvement in symptoms with ibuprofen and colchicine. A: Continue to monitor for safety R: Safety maintained.

## 2019-07-30 NOTE — Plan of Care (Signed)
  Problem: Education: Goal: Knowledge of [facility] Education information/materials will improve Outcome: Progressing  D: Patient continues to be fixated on his pain medications, what they are, and what time he will receive them. Continues to complain of gout pain in knees that is chronic. Denies SI, HI and AVH. No symptoms of withdrawal. Able to contract for safety. Requesting to be awakened to be given his prn oxycontin the next time it is due, although he reports improvement in symptoms with ibuprofen and colchicine. A: Continue to monitor for safety R: Safety maintained.

## 2019-07-30 NOTE — Progress Notes (Signed)
[facility] MD Progress Note  07/30/2019 10:11 AM Miguel [name]  MRN:  [id number] Subjective: Patient is a 58 year old male with a past psychiatric history significant for alcohol dependence and depression who presented on [id number] with a self-inflicted laceration of his left arm while he was intoxicated.  He had voiced suicidal ideation at that time.  Objective: Patient is seen and examined.  Patient is a 58 year old male with the above-stated past psychiatric history who is seen in follow-up.  His main concerns today are with his gout pain.  He stated that the pain is now on both knees bilaterally.  He is being followed by medicine for his gout.  He denied any suicidal ideation today.  No evidence of significant withdrawal, psychosis, hallucinations or suicidality.  He rated his pain this morning as an 8 or 9 out of 10.  His blood pressure is elevated at 153/78, pulse is 89.  He is afebrile.  Nursing notes reflect that he slept 6 hours last night.  Review of his laboratories on admission revealed an mildly elevated lipase of 52, a mild anemia with a hemoglobin of 12.1 and hematocrit of 38.0.  His urinalysis did not show any bacteria, but did have calcium oxalate crystals present.  There was a large amount of blood, and a mild amount of ketones.  White blood cells were 11-20.  His blood alcohol on admission was 222.  Drug screen was positive for benzodiazepines as well as cannabinoids.  The medicine consultant has recommended continued allopurinol, and added colchicine as well as ibuprofen for pain control.  Oral prednisone was also recommended.  His prednisone dosage this morning was 50 mg.  Principal Problem: MDD (major depressive disorder), recurrent severe, without psychosis ([place]) Diagnosis: Principal Problem:   MDD (major depressive disorder), recurrent severe, without psychosis ([place]) Active Problems:   Gout   Self-inflicted laceration of wrist   Alcohol abuse   Social anxiety disorder  Total Time spent  with patient: 20 minutes  Past Psychiatric History: See admission H&P  Past Medical History:  Past Medical History:  Diagnosis Date  . Alcohol abuse   . Asthma   . GERD (gastroesophageal reflux disease)   . Gout   . Hypertension   . Kidney stone   . OCD (obsessive compulsive disorder)   . Renal colic     Past Surgical History:  Procedure Laterality Date  . CHOLECYSTECTOMY  2012  . EXTRACORPOREAL SHOCK WAVE LITHOTRIPSY Left 01/12/2019   Procedure: EXTRACORPOREAL SHOCK WAVE LITHOTRIPSY (ESWL);  Surgeon: Billey Co, MD;  Location: [facility] ORS;  Service: Urology;  Laterality: Left;   Family History:  Family History  Problem Relation Age of Onset  . Alcohol abuse Father   . Breast cancer Mother 56   Family Psychiatric  History: See admission H&P Social History:  Social History   Substance and Sexual Activity  Alcohol Use Yes  . Alcohol/week: 2.0 standard drinks  . Types: 2 Glasses of wine per week   Comment: daily     Social History   Substance and Sexual Activity  Drug Use No    Social History   Socioeconomic History  . Marital status: Married    Spouse name: Not on file  . Number of children: Not on file  . Years of education: Not on file  . Highest education level: Not on file  Occupational History  . Not on file  Social Needs  . Financial resource strain: Very hard  . Food insecurity  Worry: Never true    Inability: Never true  . Transportation needs    Medical: No    Non-medical: No  Tobacco Use  . Smoking status: Former Research scientist (life sciences)  . Smokeless tobacco: Never Used  . Tobacco comment: quit 30 years ago  Substance and Sexual Activity  . Alcohol use: Yes    Alcohol/week: 2.0 standard drinks    Types: 2 Glasses of wine per week    Comment: daily  . Drug use: No  . Sexual activity: Yes  Lifestyle  . Physical activity    Days per week: 2 days    Minutes per session: 10 min  . Stress: Very much  Relationships  . Social Herbalist on  phone: Once a week    Gets together: Not on file    Attends religious service: Never    Active member of club or organization: No    Attends meetings of clubs or organizations: Never    Relationship status: Married  Other Topics Concern  . Not on file  Social History Narrative   Lives at home with his wife and takes care of his wife. Independent at baseline      - Biggest strain is financial but doesn't think they could got get more help.      Patient expressed interest in possible financial assistance. Informed written consent obtained   Additional Social History:                         Sleep: Fair  Appetite:  Fair  Current Medications: Current Facility-Administered Medications  Medication Dose Route Frequency Provider Last Rate Last Dose  . acetaminophen (TYLENOL) tablet 650 mg  650 mg Oral Q6H PRN Vaughan Basta, MD      . allopurinol (ZYLOPRIM) tablet 300 mg  300 mg Oral Daily Clapacs, Madie Reno, MD   300 mg at 07/30/19 0735  . alum & mag hydroxide-simeth (MAALOX/MYLANTA) 200-200-20 MG/5ML suspension 30 mL  30 mL Oral Q4H PRN Money, Lowry Ram, FNP      . cephALEXin (KEFLEX) capsule 500 mg  500 mg Oral Q8H Sainani, Belia Heman, MD   500 mg at 07/30/19 0600  . chlordiazePOXIDE (LIBRIUM) capsule 25 mg  25 mg Oral Q4H PRN Clapacs, Madie Reno, MD   25 mg at 07/29/19 2108  . citalopram (CELEXA) tablet 20 mg  20 mg Oral Daily Clapacs, Madie Reno, MD   20 mg at 07/30/19 0735  . colchicine tablet 0.6 mg  0.6 mg Oral BID Vaughan Basta, MD   0.6 mg at 07/30/19 0735  . folic acid (FOLVITE) tablet 1 mg  1 mg Oral Daily Clapacs, John T, MD   1 mg at 07/30/19 0735  . hydrOXYzine (ATARAX/VISTARIL) tablet 25 mg  25 mg Oral TID PRN Money, Lowry Ram, FNP   25 mg at 07/29/19 2108  . ibuprofen (ADVIL) tablet 400 mg  400 mg Oral Q8H PRN Vaughan Basta, MD   400 mg at 07/30/19 0640  . lisinopril (ZESTRIL) tablet 20 mg  20 mg Oral Daily Money, Lowry Ram, FNP   20 mg at 07/30/19 0736   . magnesium hydroxide (MILK OF MAGNESIA) suspension 30 mL  30 mL Oral Daily PRN Money, Darnelle Maffucci B, FNP      . multivitamins with iron tablet 1 tablet  1 tablet Oral Daily Clapacs, Madie Reno, MD   1 tablet at 07/30/19 0735  . neomycin-bacitracin-polymyxin (NEOSPORIN) ointment packet   Topical  BID Clapacs, Madie Reno, MD   1 application at [id number]  . oxyCODONE-acetaminophen (PERCOCET/ROXICET) 5-325 MG per tablet 1 tablet  1 tablet Oral Q6H PRN Vaughan Basta, MD   1 tablet at 07/30/19 0856  . predniSONE (DELTASONE) tablet 50 mg  50 mg Oral Q breakfast Vaughan Basta, MD   50 mg at 07/30/19 0736  . thiamine (VITAMIN B-1) tablet 250 mg  250 mg Oral Daily Clapacs, Madie Reno, MD   250 mg at 07/30/19 0735  . traZODone (DESYREL) tablet 50 mg  50 mg Oral QHS PRN Money, Lowry Ram, FNP   50 mg at 07/29/19 2108  . vitamin C (ASCORBIC ACID) tablet 500 mg  500 mg Oral Daily Clapacs, Madie Reno, MD   500 mg at 07/30/19 [id number]    Lab Results:  Results for orders placed or performed during the hospital encounter of 07/27/19 (from the past 48 hour(s))  CK     Status: Abnormal   Collection Time: 07/28/19  7:43 PM  Result Value Ref Range   Total CK 36 (L) 49 - 397 U/L    Comment: Performed at [facility], [address] [place], [place] [address]    Blood Alcohol level:  Lab Results  Component Value Date   ETH 222 (H) 07/26/2019   ETH 175 (H) [id number]    Metabolic Disorder Labs: Lab Results  Component Value Date   HGBA1C 5.1 03/12/2017   MPG 100 03/12/2017   No results found for: PROLACTIN Lab Results  Component Value Date   CHOL 144 03/12/2017   TRIG 107 03/12/2017   HDL 52 03/12/2017   CHOLHDL 2.8 03/12/2017   VLDL 21 03/12/2017   LDLCALC 71 03/12/2017   LDLCALC 70 06/27/2014    Physical Findings: AIMS:  , ,  ,  ,    CIWA:  CIWA-Ar Total: 3 COWS:     Musculoskeletal: Strength & Muscle Tone: within normal limits Gait & Station: shuffle Patient leans:  N/A  Psychiatric Specialty Exam: Physical Exam  Nursing note and vitals reviewed. Constitutional: He is oriented to person, place, and time. He appears well-developed and well-nourished.  HENT:  Head: Normocephalic and atraumatic.  Respiratory: Effort normal.  Neurological: He is alert and oriented to person, place, and time.    ROS  Blood pressure (!) 157/98, pulse 84, temperature (!) 97 F (36.1 C), temperature source Oral, resp. rate 18, height 6' (1.829 m), weight 84.4 kg, SpO2 100 %.Body mass index is 25.23 kg/m.  General Appearance: Disheveled  Eye Contact:  Fair  Speech:  Normal Rate  Volume:  Increased  Mood:  Anxious  Affect:  Congruent  Thought Process:  Coherent and Descriptions of Associations: Intact  Orientation:  Full (Time, Place, and Person)  Thought Content:  Logical  Suicidal Thoughts:  No  Homicidal Thoughts:  No  Memory:  Immediate;   Fair Recent;   Fair Remote;   Fair  Judgement:  Fair  Insight:  Fair  Psychomotor Activity:  Increased  Concentration:  Concentration: Fair and Attention Span: Fair  Recall:  [employer] of Knowledge:  Fair  Language:  Fair  Akathisia:  Negative  Handed:  Right  AIMS (if indicated):     Assets:  Desire for Improvement Resilience  ADL's:  Intact  Cognition:  WNL  Sleep:  Number of Hours: 6     Treatment Plan Summary: Daily contact with patient to assess and evaluate symptoms and progress in treatment, Medication management and Plan :  Patient is seen and examined.  Patient is a 58 year old male with the above-stated past psychiatric history who is seen in follow-up.   Diagnosis: #1 alcohol dependence, #2 alcohol withdrawal, #3 major depression, #4 gout, #5 hypertension.  Patient continues on Librium as needed for alcohol withdrawal.  He is also receiving folic acid and thiamine.  His blood pressure is elevated today, and that may be secondary to his pain as well as his withdrawal.  We will continue the lisinopril as  written.  He is also receiving ibuprofen for his gout, and that can elevate blood pressure mildly.  He continues on colchicine, ibuprofen, oxycodone, prednisone for his gout.  Medicine continues to follow him for that.  He denied any suicidal or homicidal ideation.  His wounds on his arm are improving, but the bruising from the trauma has spread dependently.  No change in his current medications. 1.  Continue allopurinol 300 mg p.o. daily for gout. 2.  Continue Keflex 500 mg p.o. every 8 hours for possible infection from trauma to left upper extremity. 3.  Continue Librium 25 mg every 4 hours as needed anxiety or withdrawal symptoms. 4.  Continue Celexa 20 mg p.o. daily for anxiety and depression. 5.  Continue colchicine 0.6 mg p.o. twice daily for gout. 6.  Continue folic acid 1 mg p.o. daily for nutritional supplementation. 7.  Continue hydroxyzine 25 mg p.o. 3 times daily as needed anxiety. 8.  Continue ibuprofen 400 mg p.o. every 8 hours PRN pain. 9.  Continue lisinopril 20 mg p.o. daily for hypertension. 10.  Continue multivitamin for nutritional supplementation. 11.  Continue Neosporin ointment twice daily on left upper extremity wounds. 12.  Continue Percocet 1 tablet p.o. every 6 hours as needed pain. 13.  Continue prednisone 50 mg p.o. daily for gout. 14.  Continue thiamine 250 mg p.o. daily for nutritional supplementation. 15.  Continue vitamin C 500 mg p.o. daily for nutritional supplementation. 15.  Disposition planning-in progress.  Sharma Covert, MD 07/30/2019, 10:11 AM

## 2019-07-30 NOTE — Plan of Care (Signed)
  Problem: Coping: Goal: Ability to verbalize frustrations and anger appropriately will improve Outcome: Progressing  Patient verbalized frustrations and anger appropriately to staff.

## 2019-07-31 MED ORDER — HYDROXYZINE HCL 25 MG PO TABS: 25.0000 mg | ORAL_TABLET | Freq: Three times a day (TID) | ORAL | 0 refills | Status: DC | PRN

## 2019-07-31 MED ORDER — ASCORBIC ACID 500 MG PO TABS: 500.0000 mg | ORAL_TABLET | Freq: Every day | ORAL | 0 refills | Status: DC

## 2019-07-31 MED ORDER — CITALOPRAM HYDROBROMIDE 20 MG PO TABS: 20.0000 mg | ORAL_TABLET | Freq: Every day | ORAL | 0 refills | Status: DC

## 2019-07-31 MED ORDER — LISINOPRIL 40 MG PO TABS: 40.0000 mg | ORAL_TABLET | Freq: Every day | ORAL | 0 refills | Status: DC

## 2019-07-31 MED ORDER — INDOMETHACIN ER 75 MG PO CPCR
75.0000 mg | ORAL_CAPSULE | Freq: Two times a day (BID) | ORAL | Status: DC
  Administered 2019-07-31 – 2019-08-01 (×3): 75 mg via ORAL
  Filled 2019-07-31 (×4): qty 1

## 2019-07-31 MED ORDER — COLCHICINE 0.6 MG PO TABS: 0.6000 mg | ORAL_TABLET | Freq: Every day | ORAL | 0 refills | Status: DC | PRN

## 2019-07-31 MED ORDER — CEPHALEXIN 500 MG PO CAPS: 500.0000 mg | ORAL_CAPSULE | Freq: Three times a day (TID) | ORAL | 0 refills | Status: DC

## 2019-07-31 MED ORDER — TRAZODONE HCL 50 MG PO TABS: 50.0000 mg | ORAL_TABLET | Freq: Every evening | ORAL | 0 refills | Status: DC | PRN

## 2019-07-31 MED ORDER — OXYCODONE-ACETAMINOPHEN 5-325 MG PO TABS
1.0000 | ORAL_TABLET | Freq: Four times a day (QID) | ORAL | Status: DC | PRN
  Administered 2019-07-31 – 2019-08-01 (×3): 1 via ORAL
  Filled 2019-07-31 (×3): qty 1

## 2019-07-31 MED ORDER — TAB-A-VITE/IRON PO TABS: 1.0000 | ORAL_TABLET | Freq: Every day | ORAL | 0 refills | Status: DC

## 2019-07-31 MED ORDER — ALLOPURINOL 300 MG PO TABS: 300.0000 mg | ORAL_TABLET | Freq: Every day | ORAL | 0 refills | Status: DC

## 2019-07-31 MED ORDER — THIAMINE HCL 250 MG PO TABS: 250.0000 mg | ORAL_TABLET | Freq: Every day | ORAL | 0 refills | Status: DC

## 2019-07-31 NOTE — Progress Notes (Signed)
[place] [facility] [place] NAME: Miguel [name]    MR#:  [id number]  DATE OF BIRTH:  07-18-1961  SUBJECTIVE:  CHIEF COMPLAINT:  No chief complaint on file.  Medical consult for left upper extremity swelling and laceration.  Today patient have complaint of both knee pain.  But said it feels better after getting oxycodone. Left upper extremity skin discoloration is stable now. He is now able to walk in hallway without any trouble.  Today he was still asking me to order 1 more dose of oxycodone.  REVIEW OF SYSTEMS:   CONSTITUTIONAL: No fever, fatigue or weakness.  EYES: No blurred or double vision.  EARS, NOSE, AND THROAT: No tinnitus or ear pain.  RESPIRATORY: No cough, shortness of breath, wheezing or hemoptysis.  CARDIOVASCULAR: No chest pain, orthopnea, edema.  GASTROINTESTINAL: No nausea, vomiting, diarrhea or abdominal pain.  GENITOURINARY: No dysuria, hematuria.  ENDOCRINE: No polyuria, nocturia,  HEMATOLOGY: No anemia, easy bruising or bleeding SKIN: No rash or lesion. MUSCULOSKELETAL: He have complaint of left knee pain today.   NEUROLOGIC: No tingling, numbness, weakness.  PSYCHIATRY: No anxiety or depression.   ROS  DRUG ALLERGIES:   Allergies  Allergen Reactions  . Percocet [Oxycodone-Acetaminophen] Nausea And Vomiting    VITALS:  Blood pressure 120/81, pulse 79, temperature 97.9 F (36.6 C), temperature source Oral, resp. rate 18, height 6' (1.829 m), weight 84.4 kg, SpO2 96 %.  PHYSICAL EXAMINATION:  GENERAL:  58 y.o.-year-old patient lying in the bed with no acute distress.  EYES: Pupils equal, round, reactive to light and accommodation. No scleral icterus. Extraocular muscles intact.  HEENT: Head atraumatic, normocephalic. Oropharynx and nasopharynx clear.  NECK:  Supple, no jugular venous distention. No thyroid enlargement, no tenderness.  LUNGS: Normal breath sounds bilaterally, no wheezing, rales,rhonchi or crepitation. No  use of accessory muscles of respiration.  CARDIOVASCULAR: S1, S2 normal. No murmurs, rubs, or gallops.  ABDOMEN: Soft, nontender, nondistended. Bowel sounds present. No organomegaly or mass.  EXTREMITIES: Left forearm and lower part of is swollen with some visible bruises and ecchymosis and scratch marks.  Full wrist movement on left with palpable radial pulses and no warm to touch on left forearm.  There is slight tenderness on left forearm which has some swelling due to hematoma. Left knee does not appear swollen and has absolutely no redness and minimal tenderness on local touch and pressure. NEUROLOGIC: Cranial nerves II through XII are intact. Muscle strength 5/5 in all extremities except for left lower extremity he is not moving much due to complaint of pain in his knee. Sensation intact. Gait not checked.  PSYCHIATRIC: The patient is alert and oriented x 3.  SKIN: No obvious rash, lesion, or ulcer.            Physical Exam LABORATORY PANEL:   CBC Recent Labs  Lab 07/26/19 1356  WBC 6.8  HGB 12.1*  HCT 38.0*  PLT 241   ------------------------------------------------------------------------------------------------------------------  Chemistries  Recent Labs  Lab 07/26/19 1356  NA 141  K 3.6  CL 107  CO2 24  GLUCOSE 114*  BUN 13  CREATININE 1.07  CALCIUM 8.6*  AST 14*  ALT 16  ALKPHOS 68  BILITOT 0.8   ------------------------------------------------------------------------------------------------------------------  Cardiac Enzymes No results for input(s): TROPONINI in the last 168 hours. ------------------------------------------------------------------------------------------------------------------  RADIOLOGY:  No results found.  ASSESSMENT AND PLAN:   Principal Problem:   MDD (major depressive disorder), recurrent severe, without psychosis (Miguel [name]) Active Problems:  Gout   Self-inflicted laceration of wrist   Alcohol abuse   Social anxiety  disorder   58 y.o. male with a known history of alcohol abuse, hypertension, GERD, gout, history of OCD who presented to the hospital yesterday secondary to a self-inflicted laceration and suicide attempt secondary to depression and he was also intoxicated.  Patient was admitted to the behavioral medicine unit and today noticed some left upper extremity swelling redness and therefore hospitalist services contacted for consultation.  1.  Left upper extremity redness and swelling-this is secondary to the self-inflicted laceration with surrounding hematoma.  Patient does have some significant bruising but no clinical evidence of worsening cellulitis. He does not appear to have any lymphangitic spread. - Continue supportive care to keep the arm elevated, advised nurse to give him a ice pack to apply to the swelling, empirically place him on Keflex for 5 days. - Left forearm swelling with minimal tenderness on local palpation but no warm to touch and he is still able to move his fingers and wrist with full range without much pain. There is no concern of compartment syndrome now as there is no tight swelling and his movements at elbow and wrist joint are not painful.  I advised to give 2 more days of Keflex on discharge and advised patient to continue using ice packs on his left forearm.  He can continue taking Tylenol or ibuprofen for pain control.  I would recommend to not prescribe opioids.  2.  Alcohol abuse- continue Librium taper as patient is high risk for alcohol withdrawal. -Continue thiamine supplements. No signs of alcohol withdrawal currently  3.  Essential hypertension-continue lisinopril.  4.  Depression-continue Celexa.  Manage per psychiatric team.  5.    Acute gout- now he complains of both knee pain and not able to move much. Continue allopurinol, added colchicine and ibuprofen for pain control. Start oral prednisone-we can stop prednisone tomorrow He still had complain of  significant pain so I have started on oxycodone which seems to be helping for pain control. As pain is better now, he can be discharged from medical point with continued oral allopurinol daily.  We can advise him to continue taking colchicine for next 3 to 4 days and then take as needed twice daily for pain in future. For next few days he can take Tylenol or ibuprofen for pain.    All the records are reviewed and case discussed with Care Management/Social Workerr. Management plans discussed with the patient, family and they are in agreement.  CODE STATUS: Full  TOTAL TIME TAKING CARE OF THIS PATIENT: 35 minutes.   As patient is improving and stable, I will sign off.  Please call for any further needs from medical team.  POSSIBLE D/C IN 1-2 DAYS, DEPENDING ON CLINICAL CONDITION.   Vaughan Basta M.D on 07/31/2019   Between 7am to 6pm - Pager - [phone]  After 6pm go to [email or web address] - password [email or web address] [facility]  Office  [phone]  CC: Primary care physician; [facility]  Note: This dictation was prepared with Dragon dictation along with smaller phrase technology. Any transcriptional errors that result from this process are unintentional.
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
## 2019-07-31 NOTE — Progress Notes (Signed)
Recreation Therapy Notes   Date: 07/31/2019  Time: 9:30 am   Location: Craft room   Behavioral response: N/A   Intervention Topic: Necessities   Discussion/Intervention: Patient did not attend group.   Clinical Observations/Feedback:  Patient did not attend group.   Melisse Caetano LRT/CTRS        Ronrico Dupin 07/31/2019 11:03 AM

## 2019-07-31 NOTE — Plan of Care (Signed)
Patient stated that he could not sleep last night because of pain.According to the sleep hours sheet patient slept 8.30 hrs.Patient is upset because his Percocet is D/C'd.Patient denies SI,HI and AVH.Did not attend groups.Appetite and energy level good.Ice pack given to patient for his arm.Support and encouragement given.
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
## 2019-07-31 NOTE — [facility] Group Notes (Signed)
Overcoming Obstacles  07/31/2019 1PM  Type of Therapy and Topic:  Group Therapy:  Overcoming Obstacles  Participation Level:  Did Not Attend    Description of Group:    In this group patients will be encouraged to explore what they see as obstacles to their own wellness and recovery. They will be guided to discuss their thoughts, feelings, and behaviors related to these obstacles. The group will process together ways to cope with barriers, with attention given to specific choices patients can make. Each patient will be challenged to identify changes they are motivated to make in order to overcome their obstacles. This group will be process-oriented, with patients participating in exploration of their own experiences as well as giving and receiving support and challenge from other group members.   Therapeutic Goals: 1. Patient will identify personal and current obstacles as they relate to admission. 2. Patient will identify barriers that currently interfere with their wellness or overcoming obstacles.  3. Patient will identify feelings, thought process and behaviors related to these barriers. 4. Patient will identify two changes they are willing to make to overcome these obstacles:      Summary of Patient Progress     Therapeutic Modalities:   Cognitive Behavioral Therapy Solution Focused Therapy Motivational Interviewing Relapse Prevention Therapy    Sanjuana Kava, MSW, LCSW 07/31/2019 2:20 PM

## 2019-08-01 MED ORDER — GABAPENTIN 100 MG PO CAPS: 200.0000 mg | ORAL_CAPSULE | Freq: Three times a day (TID) | ORAL | 1 refills | Status: DC

## 2019-08-01 MED ORDER — FOLIC ACID 1 MG PO TABS: 1.0000 mg | ORAL_TABLET | Freq: Every day | ORAL | 1 refills | Status: DC

## 2019-08-01 MED ORDER — COLCHICINE 0.6 MG PO TABS: 0.6000 mg | ORAL_TABLET | Freq: Every day | ORAL | 1 refills | Status: DC | PRN

## 2019-08-01 MED ORDER — HYDROXYZINE HCL 25 MG PO TABS: 25.0000 mg | ORAL_TABLET | Freq: Three times a day (TID) | ORAL | 1 refills | Status: DC | PRN

## 2019-08-01 MED ORDER — LISINOPRIL 40 MG PO TABS: 40.0000 mg | ORAL_TABLET | Freq: Every day | ORAL | 1 refills | Status: DC

## 2019-08-01 MED ORDER — ASCORBIC ACID 500 MG PO TABS: 500.0000 mg | ORAL_TABLET | Freq: Every day | ORAL | 1 refills | Status: DC

## 2019-08-01 MED ORDER — OXYCODONE-ACETAMINOPHEN 5-325 MG PO TABS: 1.0000 | ORAL_TABLET | Freq: Four times a day (QID) | ORAL | 0 refills | Status: DC | PRN

## 2019-08-01 MED ORDER — CITALOPRAM HYDROBROMIDE 20 MG PO TABS: 20.0000 mg | ORAL_TABLET | Freq: Every day | ORAL | 1 refills | Status: DC

## 2019-08-01 MED ORDER — ALLOPURINOL 300 MG PO TABS: 300.0000 mg | ORAL_TABLET | Freq: Every day | ORAL | 1 refills | Status: DC

## 2019-08-01 MED ORDER — TRAZODONE HCL 50 MG PO TABS: 50.0000 mg | ORAL_TABLET | Freq: Every evening | ORAL | 1 refills | Status: DC | PRN

## 2019-08-01 MED ORDER — THIAMINE HCL 250 MG PO TABS: 250.0000 mg | ORAL_TABLET | Freq: Every day | ORAL | 1 refills | Status: DC

## 2019-08-01 MED ORDER — TAB-A-VITE/IRON PO TABS: 1.0000 | ORAL_TABLET | Freq: Every day | ORAL | 1 refills | Status: DC

## 2019-08-01 NOTE — Progress Notes (Signed)
Recreation Therapy Notes  INPATIENT RECREATION TR PLAN  Patient Details Name: Miguel [name] MRN: [id number] DOB: 1961-04-26 Today's Date: 08/01/2019  Rec Therapy Plan Is patient appropriate for Therapeutic Recreation?: Yes Treatment times per week: At least 3 Estimated Length of Stay: 5-7 days TR Treatment/Interventions: Group participation (Comment)  Discharge Criteria Pt will be discharged from therapy if:: Discharged Treatment plan/goals/alternatives discussed and agreed upon by:: Patient/family  Discharge Summary Short term goals set: Patient will engage in groups without prompting or encouragement from LRT x3 group sessions within 5 recreation therapy group sessions Short term goals met: Not met(Patient did not attend any groups) Reason goals not met: Patient did not attend any groups Therapeutic equipment acquired: N/A Reason patient discharged from therapy: Discharge from hospital Pt/family agrees with progress & goals achieved: Yes Date patient discharged from therapy: 08/01/19   Aubery Douthat 08/01/2019, 1:22 PM

## 2019-08-01 NOTE — [facility] Group Notes (Signed)

## 2019-08-01 NOTE — Progress Notes (Signed)
  [facility] Adult Case Management Discharge Plan :  Will you be returning to the same living situation after discharge:  Yes,  lives with spouse At discharge, do you have transportation home?: Yes,  provided with taxi voucher Do you have the ability to pay for your medications: No.  Release of information consent forms completed and in the chart;  Patient's signature needed at discharge.  Patient to Follow up at: Follow-up Information    [facility] Follow up.   Why: Please attend walk in hours from 9-4 on Monday-Friday. Contact information: [place]  [address] [phone]           Next level of care provider has access to [place] and Suicide Prevention discussed: Yes,  with pt; declined family contact     Has patient been referred to the Quitline?: N/A patient is not a smoker  Patient has been referred for addiction treatment: N/A  Yvette Rack, LCSW 08/01/2019, 10:35 AM

## 2019-08-01 NOTE — Plan of Care (Signed)
  Problem: Education: Goal: Knowledge of [facility] Education information/materials will improve Outcome: Adequate for Discharge   Problem: Coping: Goal: Ability to verbalize frustrations and anger appropriately will improve Outcome: Adequate for Discharge Goal: Ability to demonstrate self-control will improve Outcome: Adequate for Discharge   Problem: Safety: Goal: Periods of time without injury will increase Outcome: Adequate for Discharge   Problem: Education: Goal: Knowledge of the prescribed therapeutic regimen will improve Outcome: Adequate for Discharge   Problem: Education: Goal: Knowledge of disease or condition will improve Outcome: Adequate for Discharge   Problem: Health Behavior/Discharge Planning: Goal: Ability to identify changes in lifestyle to reduce recurrence of condition will improve Outcome: Adequate for Discharge Goal: Identification of resources available to assist in meeting health care needs will improve Outcome: Adequate for Discharge

## 2019-08-01 NOTE — Plan of Care (Signed)
  Problem: Group Participation Goal: STG - Patient will engage in groups without prompting or encouragement from LRT x3 group sessions within 5 recreation therapy group sessions Description: STG - Patient will engage in groups without prompting or encouragement from LRT x3 group sessions within 5 recreation therapy group sessions 08/01/2019 1320 by Ernest Haber, LRT Outcome: Not Applicable [id number] by Ernest Haber, LRT Outcome: Not Met (add Reason)

## 2019-08-01 NOTE — Progress Notes (Signed)
D: Patient is aware of  Discharge this shift . Patient denies suicidal /homicidal ideations. Patient received all belongings brought in   No Storage medications. Writer reviewed Discharge Summary, Suicide Risk Assessment, and Transitional Record. Patient also received Prescriptions   from  MD. A 7 day supply of medications given to patient . Aware  Of follow up appointment . R: Patient left unit with no questions  Or concerns  With taxi

## 2019-08-01 NOTE — Discharge Summary (Signed)
Physician Discharge Summary Note  Patient:  Miguel [name] is an 58 y.o., male MRN:  [id number] DOB:  Sep 24, 1961 Patient phone:  [phone] (home)  Patient address:   [place] [address],  Total Time spent with patient: 45 minutes  Date of Admission:  07/27/2019 Date of Discharge: August 01, 2019  Reason for Admission: Patient was admitted intoxicated after cutting of his left forearm pretty badly and voicing suicidal thoughts  Principal Problem: MDD (major depressive disorder), recurrent severe, without psychosis ([place]) Discharge Diagnoses: Principal Problem:   MDD (major depressive disorder), recurrent severe, without psychosis ([place]) Active Problems:   Gout   Self-inflicted laceration of wrist   Alcohol abuse   Social anxiety disorder   Past Psychiatric History: History of multiple presentations to the emergency room under similar circumstances although this time he is cut himself worse than usual.  Chronic alcohol abuse and chronic anxiety disorder.  History of self injury.  Minimal cooperation with past recommended treatment  Past Medical History:  Past Medical History:  Diagnosis Date  . Alcohol abuse   . Asthma   . GERD (gastroesophageal reflux disease)   . Gout   . Hypertension   . Kidney stone   . OCD (obsessive compulsive disorder)   . Renal colic     Past Surgical History:  Procedure Laterality Date  . CHOLECYSTECTOMY  2012  . EXTRACORPOREAL SHOCK WAVE LITHOTRIPSY Left 01/12/2019   Procedure: EXTRACORPOREAL SHOCK WAVE LITHOTRIPSY (ESWL);  Surgeon: Billey Co, MD;  Location: [facility] ORS;  Service: Urology;  Laterality: Left;   Family History:  Family History  Problem Relation Age of Onset  . Alcohol abuse Father   . Breast cancer Mother 75   Family Psychiatric  History: Alcohol abuse Social History:  Social History   Substance and Sexual Activity  Alcohol Use Yes  . Alcohol/week: 2.0 standard drinks  . Types: 2 Glasses of  wine per week   Comment: daily     Social History   Substance and Sexual Activity  Drug Use No    Social History   Socioeconomic History  . Marital status: Married    Spouse name: Not on file  . Number of children: Not on file  . Years of education: Not on file  . Highest education level: Not on file  Occupational History  . Not on file  Social Needs  . Financial resource strain: Very hard  . Food insecurity    Worry: Never true    Inability: Never true  . Transportation needs    Medical: No    Non-medical: No  Tobacco Use  . Smoking status: Former Research scientist (life sciences)  . Smokeless tobacco: Never Used  . Tobacco comment: quit 30 years ago  Substance and Sexual Activity  . Alcohol use: Yes    Alcohol/week: 2.0 standard drinks    Types: 2 Glasses of wine per week    Comment: daily  . Drug use: No  . Sexual activity: Yes  Lifestyle  . Physical activity    Days per week: 2 days    Minutes per session: 10 min  . Stress: Very much  Relationships  . Social Herbalist on phone: Once a week    Gets together: Not on file    Attends religious service: Never    Active member of club or organization: No    Attends meetings of clubs or organizations: Never    Relationship status: Married  Other  Topics Concern  . Not on file  Social History Narrative   Lives at home with his wife and takes care of his wife. Independent at baseline      - Biggest strain is financial but doesn't think they could got get more help.      Patient expressed interest in possible financial assistance. Informed written consent obtained    Hospital Course: Patient admitted to the psychiatric ward.  15-minute checks employed.  Did not display any dangerous aggressive or violent or suicidal behavior on the unit.  He was cooperative with recommended treatment.  Patient was started on citalopram with the goal of ultimately finding medication that could help with his multiple anxiety symptoms and chronic  dysphoria.  He has tolerated medicine without side effects.  He attended groups and participated appropriately showing pretty good insight.  As far as his arm he had quite a bit of swelling around the lacerations.  Medicine consult was obtained because of my concern that he could have an infection.  He was treated with oral antibiotics but the consensus was that he was not developing a cellulitis.  Orthopedic surgery was also consulted just to make sure that he was not developing compartment syndrome.  Dr. Marry Guan saw the patient this morning and does not feel that this is compartment syndrome and does not feel that any surgical follow-up is needed other than removing the sutures.  Pain seems to be getting better.  At his request I did give him a very small number of narcotic pain pills at discharge because the arm still looks pretty tender but hopefully he will be able to stop that in a day or 2.  Patient met with treatment team and is voicing agreement to attend follow-up treatment at [facility] Findings: AIMS:  , ,  ,  ,    CIWA:  CIWA-Ar Total: 3 COWS:     Musculoskeletal: Strength & Muscle Tone: within normal limits Gait & Station: normal Patient leans: N/A  Psychiatric Specialty Exam: Physical Exam  Nursing note and vitals reviewed. Constitutional: He appears well-developed and well-nourished.  HENT:  Head: Normocephalic and atraumatic.  Eyes: Pupils are equal, round, and reactive to light. Conjunctivae are normal.  Neck: Normal range of motion.  Cardiovascular: Regular rhythm and normal heart sounds.  Respiratory: Effort normal. No respiratory distress.  GI: Soft.  Musculoskeletal: Normal range of motion.  Neurological: He is alert.  Skin: Skin is warm and dry.     Psychiatric: He has a normal mood and affect. His speech is normal and behavior is normal. Judgment and thought content normal. His mood appears not anxious. Cognition and memory are normal.    Review of Systems   Constitutional: Negative.   HENT: Negative.   Eyes: Negative.   Respiratory: Negative.   Cardiovascular: Negative.   Gastrointestinal: Negative.   Musculoskeletal: Positive for myalgias.  Skin: Negative.   Neurological: Negative.   Psychiatric/Behavioral: Negative.     Blood pressure (!) 158/92, pulse 65, temperature 97.8 F (36.6 C), temperature source Oral, resp. rate 18, height 6' (1.829 m), weight 84.4 kg, SpO2 99 %.Body mass index is 25.23 kg/m.  General Appearance: Casual  Eye Contact:  Good  Speech:  Clear and Coherent  Volume:  Normal  Mood:  Euthymic  Affect:  Congruent  Thought Process:  Goal Directed  Orientation:  Full (Time, Place, and Person)  Thought Content:  Logical  Suicidal Thoughts:  No  Homicidal Thoughts:  No  Memory:  Immediate;   Fair Recent;   Fair Remote;   Fair  Judgement:  Fair  Insight:  Fair  Psychomotor Activity:  Normal  Concentration:  Concentration: Fair  Recall:  [place] of Knowledge:  Fair  Language:  Fair  Akathisia:  No  Handed:  Right  AIMS (if indicated):     Assets:  Desire for Improvement Housing Physical Health Resilience Social Support  ADL's:  Intact  Cognition:  WNL  Sleep:  Number of Hours: 8        Has this patient used any form of tobacco in the last 30 days? (Cigarettes, Smokeless Tobacco, Cigars, and/or Pipes) Yes, Yes, A prescription for an FDA-approved tobacco cessation medication was offered at discharge and the patient refused  Blood Alcohol level:  Lab Results  Component Value Date   ETH 222 (H) 07/26/2019   ETH 175 (H) 63/11/6008    Metabolic Disorder Labs:  Lab Results  Component Value Date   HGBA1C 5.1 03/12/2017   MPG 100 03/12/2017   No results found for: PROLACTIN Lab Results  Component Value Date   CHOL 144 03/12/2017   TRIG 107 03/12/2017   HDL 52 03/12/2017   CHOLHDL 2.8 03/12/2017   VLDL 21 03/12/2017   LDLCALC 71 03/12/2017   Ruth 70 06/27/2014    See Psychiatric  Specialty Exam and Suicide Risk Assessment completed by Attending Physician prior to discharge.  Discharge destination:  Home  Is patient on multiple antipsychotic therapies at discharge:  No   Has Patient had three or more failed trials of antipsychotic monotherapy by history:  No  Recommended Plan for Multiple Antipsychotic Therapies: NA  Discharge Instructions    Diet - low sodium heart healthy   Complete by: As directed    Increase activity slowly   Complete by: As directed      Allergies as of 08/01/2019      Reactions   Percocet [oxycodone-acetaminophen] Nausea And Vomiting      Medication List    STOP taking these medications   albuterol 108 (90 Base) MCG/ACT inhaler Commonly known as: Ventolin HFA   cefUROXime 250 MG tablet Commonly known as: Ceftin   chlordiazePOXIDE 25 MG capsule Commonly known as: LIBRIUM   diclofenac sodium 1 % Gel Commonly known as: VOLTAREN   docusate sodium 100 MG capsule Commonly known as: Colace   methocarbamol 500 MG tablet Commonly known as: Robaxin   multivitamin with minerals tablet   promethazine 12.5 MG tablet Commonly known as: PHENERGAN   tiZANidine 4 MG tablet Commonly known as: ZANAFLEX     TAKE these medications     Indication  allopurinol 300 MG tablet Commonly known as: ZYLOPRIM Take 1 tablet (300 mg total) by mouth daily.  Indication: Primary Gout   ascorbic acid 500 MG tablet Commonly known as: VITAMIN C Take 1 tablet (500 mg total) by mouth daily.  Indication: Inadequate Vitamin C   citalopram 20 MG tablet Commonly known as: CELEXA Take 1 tablet (20 mg total) by mouth daily.  Indication: Depression, Generalized Anxiety Disorder, Social Anxiety Disorder   colchicine 0.6 MG tablet Take 1 tablet (0.6 mg total) by mouth daily as needed.  Indication: Gout   Fluticasone-Salmeterol 100-50 MCG/DOSE Aepb Commonly known as: Advair Diskus Inhale 1 puff into the lungs 2 (two) times daily.  Indication:  Asthma   folic acid 1 MG tablet Commonly known as: FOLVITE Take 1 tablet (1 mg total) by mouth daily.  Indication: Anemia From Inadequate Folic  Acid   gabapentin 100 MG capsule Commonly known as: NEURONTIN Take 2 capsules (200 mg total) by mouth 3 (three) times daily.  Indication: Abuse or Misuse of Alcohol   hydrOXYzine 25 MG tablet Commonly known as: ATARAX/VISTARIL Take 1 tablet (25 mg total) by mouth 3 (three) times daily as needed for anxiety.  Indication: Feeling Anxious   lisinopril 40 MG tablet Commonly known as: ZESTRIL Take 1 tablet (40 mg total) by mouth daily. What changed:   medication strength  how much to take  Indication: High Blood Pressure Disorder   multivitamins with iron Tabs tablet Take 1 tablet by mouth daily.  Indication: Anemia From Inadequate Iron in the Body   oxyCODONE-acetaminophen 5-325 MG tablet Commonly known as: PERCOCET/ROXICET Take 1 tablet by mouth every 6 (six) hours as needed for moderate pain or severe pain.  Indication: Pain   thiamine 250 MG tablet Take 1 tablet (250 mg total) by mouth daily.  Indication: Loss of Appetite   traZODone 50 MG tablet Commonly known as: DESYREL Take 1 tablet (50 mg total) by mouth at bedtime as needed for sleep.  Indication: Trouble Sleeping      Follow-up Information    [facility] Follow up.   Why: Please attend walk in hours from 9-4 on Monday-Friday. Contact information: [address] [place] [address] [phone]           Follow-up recommendations:  Activity:  Activity as tolerated Diet:  Regular diet Other:  Follow-up with [facility] and with alcoholics anonymous.  Consider involvement in intensive outpatient program  Comments: Received extensive psychoeducation and support around the importance of stopping drinking.  Continue current medicine and follow-up with outpatient mental health providers.  Tried to impress upon the patient that his problems  were treatable if he would stick with following up on the treatment.  Signed: Alethia Berthold, MD 08/01/2019, 11:20 AM

## 2019-08-01 NOTE — Progress Notes (Signed)
Recreation Therapy Notes   Date: 08/01/2019  Time: 9:30 am   Location: Craft room   Behavioral response: N/A   Intervention Topic: Self-Care  Discussion/Intervention: Patient did not attend group.   Clinical Observations/Feedback:  Patient did not attend group.   Enya Bureau LRT/CTRS        Tej Murdaugh 08/01/2019 10:29 AM

## 2019-08-01 NOTE — BHH Suicide Risk Assessment (Signed)
[facility] Discharge Suicide Risk Assessment   Principal Problem: MDD (major depressive disorder), recurrent severe, without psychosis (Miguel [name]) Discharge Diagnoses: Principal Problem:   MDD (major depressive disorder), recurrent severe, without psychosis (Miguel [name]) Active Problems:   Gout   Self-inflicted laceration of wrist   Alcohol abuse   Social anxiety disorder   Total Time spent with patient: 30 minutes  Musculoskeletal: Strength & Muscle Tone: within normal limits Gait & Station: normal Patient leans: N/A  Psychiatric Specialty Exam: Review of Systems  Constitutional: Negative.   HENT: Negative.   Eyes: Negative.   Respiratory: Negative.   Cardiovascular: Negative.   Gastrointestinal: Negative.   Musculoskeletal: Positive for myalgias.  Skin: Negative.   Neurological: Negative.   Psychiatric/Behavioral: Negative for depression, hallucinations, memory loss, substance abuse and suicidal ideas. The patient is nervous/anxious. The patient does not have insomnia.     Blood pressure (!) 158/92, pulse 65, temperature 97.8 F (36.6 C), temperature source Oral, resp. rate 18, height 6' (1.829 m), weight 84.4 kg, SpO2 99 %.Body mass index is 25.23 kg/m.  General Appearance: Casual  Eye Contact::  Good  Speech:  Normal Rate409  Volume:  Normal  Mood:  Euthymic  Affect:  Congruent  Thought Process:  Goal Directed  Orientation:  Full (Time, Place, and Person)  Thought Content:  Logical  Suicidal Thoughts:  No  Homicidal Thoughts:  No  Memory:  Immediate;   Fair Recent;   Fair Remote;   Fair  Judgement:  Fair  Insight:  Fair  Psychomotor Activity:  Normal  Concentration:  Fair  Recall:  [employer] [place]  Language: Fair  Akathisia:  No  Handed:  Right  AIMS (if indicated):     Assets:  Desire for Improvement Housing Physical Health Social Support  Sleep:  Number of Hours: 8  Cognition: WNL  ADL's:  Intact   Mental Status Per Nursing Assessment::   On  Admission:  Self-harm behaviors  Demographic Factors:  Male, Caucasian and Low socioeconomic status  Loss Factors: Financial problems/change in socioeconomic status and Patient is the sole caretaker for his disabled wife who is needs if anything are just getting worse resulting in more losses for the patient's autonomy and time for himself  Historical Factors: Prior suicide attempts and Impulsivity  Risk Reduction Factors:   Sense of responsibility to family, Religious beliefs about death, Living with another person, especially a relative and Positive social support  Continued Clinical Symptoms:  Depression:   Comorbid alcohol abuse/dependence Alcohol/Substance Abuse/Dependencies  Cognitive Features That Contribute To Risk:  None    Suicide Risk:  Mild:  Suicidal ideation of limited frequency, intensity, duration, and specificity.  There are no identifiable plans, no associated intent, mild dysphoria and related symptoms, good self-control (both objective and subjective assessment), few other risk factors, and identifiable protective factors, including available and accessible social support.  Follow-up Information    [facility] Follow up.   Why: Please attend walk in hours from 9-4 on Monday-Friday. Contact information: [place] [address] [phone]           Plan Of Care/Follow-up recommendations:  Activity:  Activity as tolerated Diet:  Regular diet Other:  Patient is to follow-up with outpatient mental health care at [facility].  As far as his arm is concerned he is to complete the next couple days of antibiotic and then continue with to get the sutures removed in another week.  He has been seen by orthopedic who recommended  that he keep his arm iced and elevated to get the swelling down.  Strongly encourage obviously to not drink alcohol which is probably the single most important thing he can do to decrease and 8 dangerousness to  himself in the future  Alethia Berthold, MD 08/01/2019, 11:07 AM

## 2019-08-01 NOTE — Progress Notes (Signed)
Patient alert and oriented x 4, affect is flat but he brightens upon approach, no distress noted, his thoughts are organized and coherent, he appears less anxious, he is elated and happy to be discharged tomorrow. Patient was complaint with scheduled medication regimen,   emotional support and encouragement was also offered he was receptive and was calm. Patient currently denies SI/HI/AVH,  noted interacting appropriately with peers and staff. 15 minutes safety checks maintained will continue to monitor.

## 2019-08-04 ENCOUNTER — Emergency Department
Admission: EM | Admit: 2019-08-04 | Discharge: 2019-08-04 | Disposition: A | Discharge status: Home / Self Care | Payer: Medicaid Other | Attending: Emergency Medicine | Admitting: Emergency Medicine

## 2019-08-04 ENCOUNTER — Other Ambulatory Visit: Payer: Self-pay

## 2019-08-04 ENCOUNTER — Emergency Department: Payer: Medicaid Other

## 2019-08-04 ENCOUNTER — Encounter: Payer: Self-pay | Admitting: *Deleted

## 2019-08-04 DIAGNOSIS — S5012XD Contusion of left forearm, subsequent encounter: Secondary | ICD-10-CM | POA: Insufficient documentation

## 2019-08-04 DIAGNOSIS — X781XXD Intentional self-harm by knife, subsequent encounter: Secondary | ICD-10-CM | POA: Insufficient documentation

## 2019-08-04 DIAGNOSIS — Z87891 Personal history of nicotine dependence: Secondary | ICD-10-CM | POA: Insufficient documentation

## 2019-08-04 DIAGNOSIS — S41112D Laceration without foreign body of left upper arm, subsequent encounter: Secondary | ICD-10-CM

## 2019-08-04 DIAGNOSIS — I10 Essential (primary) hypertension: Secondary | ICD-10-CM | POA: Insufficient documentation

## 2019-08-04 DIAGNOSIS — Z79899 Other long term (current) drug therapy: Secondary | ICD-10-CM | POA: Insufficient documentation

## 2019-08-04 DIAGNOSIS — J45909 Unspecified asthma, uncomplicated: Secondary | ICD-10-CM | POA: Insufficient documentation

## 2019-08-04 DIAGNOSIS — S51812D Laceration without foreign body of left forearm, subsequent encounter: Secondary | ICD-10-CM | POA: Insufficient documentation

## 2019-08-04 LAB — COMPREHENSIVE METABOLIC PANEL
ALT: 11 U/L (ref 0–44)
AST: 12 U/L — ABNORMAL LOW (ref 15–41)
Albumin: 3.8 g/dL (ref 3.5–5.0)
Alkaline Phosphatase: 59 U/L (ref 38–126)
Anion gap: 11 (ref 5–15)
BUN: 12 mg/dL (ref 6–20)
CO2: 24 mmol/L (ref 22–32)
Calcium: 9.1 mg/dL (ref 8.9–10.3)
Chloride: 106 mmol/L (ref 98–111)
Creatinine, Ser: 1.16 mg/dL (ref 0.61–1.24)
GFR calc Af Amer: >60 mL/min (ref >60)
GFR calc non Af Amer: >60 mL/min (ref >60)
Glucose, Bld: 171 mg/dL — ABNORMAL HIGH (ref 70–99)
Potassium: 3.2 mmol/L — ABNORMAL LOW (ref 3.5–5.1)
Sodium: 141 mmol/L (ref 135–145)
Total Bilirubin: 0.6 mg/dL (ref 0.3–1.2)
Total Protein: 6.6 g/dL (ref 6.5–8.1)

## 2019-08-04 LAB — CBC WITH DIFFERENTIAL/PLATELET
Abs Immature Granulocytes: 0.02 10*3/uL (ref 0.00–0.07)
Basophils Absolute: 0.1 10*3/uL (ref 0.0–0.1)
Basophils Relative: 1 %
Eosinophils Absolute: 0.1 10*3/uL (ref 0.0–0.5)
Eosinophils Relative: 1 %
HCT: 38.1 % — ABNORMAL LOW (ref 39.0–52.0)
Hemoglobin: 12 g/dL — ABNORMAL LOW (ref 13.0–17.0)
Immature Granulocytes: 0 %
Lymphocytes Relative: 28 %
Lymphs Abs: 1.6 10*3/uL (ref 0.7–4.0)
MCH: 27.8 pg (ref 26.0–34.0)
MCHC: 31.5 g/dL (ref 30.0–36.0)
MCV: 88.2 fL (ref 80.0–100.0)
Monocytes Absolute: 0.7 10*3/uL (ref 0.1–1.0)
Monocytes Relative: 12 %
Neutro Abs: 3.4 10*3/uL (ref 1.7–7.7)
Neutrophils Relative %: 58 %
Platelets: 280 10*3/uL (ref 150–400)
RBC: 4.32 MIL/uL (ref 4.22–5.81)
RDW: 16.5 % — ABNORMAL HIGH (ref 11.5–15.5)
WBC: 5.8 10*3/uL (ref 4.0–10.5)
nRBC: 0 % (ref 0.0–0.2)

## 2019-08-04 LAB — URINALYSIS, COMPLETE (UACMP) WITH MICROSCOPIC
Bacteria, UA: NONE SEEN
Bilirubin Urine: NEGATIVE
Glucose, UA: NEGATIVE mg/dL
Hgb urine dipstick: NEGATIVE
Ketones, ur: NEGATIVE mg/dL
Leukocytes,Ua: NEGATIVE
Nitrite: NEGATIVE
Protein, ur: NEGATIVE mg/dL
Specific Gravity, Urine: 1.003 — ABNORMAL LOW (ref 1.005–1.030)
pH: 6 (ref 5.0–8.0)

## 2019-08-04 MED ORDER — LIDOCAINE 5 % EX PTCH
1.0000 | MEDICATED_PATCH | CUTANEOUS | Status: DC
  Administered 2019-08-04: 1 via TRANSDERMAL
  Filled 2019-08-04: qty 1

## 2019-08-04 MED ORDER — SODIUM CHLORIDE 0.9% FLUSH: 3.0000 mL | Freq: Once | INTRAVENOUS | Status: DC

## 2019-08-04 MED ORDER — ACETAMINOPHEN 325 MG PO TABS
650.0000 mg | ORAL_TABLET | Freq: Once | ORAL | Status: AC
  Administered 2019-08-04: 22:00:00 650 mg via ORAL
  Filled 2019-08-04: qty 2

## 2019-08-04 MED ORDER — LIDOCAINE 5 % EX PTCH: 1.0000 | MEDICATED_PATCH | Freq: Two times a day (BID) | CUTANEOUS | 0 refills | Status: AC

## 2019-08-04 NOTE — ED Triage Notes (Signed)
Pt presents w/ c/o pain in LFA that is bad enough he is unable to sleep. Pt's wound was self-inflicted and shows signs of infection. Pt was brought to hospital by a friend and has no reliable ride home.

## 2019-08-04 NOTE — ED Provider Notes (Signed)
[facility] Emergency Department Provider Note   ____________________________________________   First MD Initiated Contact with Patient 08/04/19 2111     (approximate)  I have reviewed the triage vital signs and the nursing notes.   HISTORY  Chief Complaint Wound Infection    HPI Miguel [name] is a 58 y.o. male with past medical history of alcohol abuse who presents to the ED complaining of left arm pain.  Patient recently was admitted to the behavioral health unit after presenting with a self-inflicted laceration to his left forearm.  He had apparent arterial bleed requiring repair in the ED.  He subsequently developed some redness around the wound and was started on Keflex while admitted.  He was evaluated by orthopedics during the admission, where wound was not thought to be clinically infected, although he was continued on Keflex.  He is also noted to have ecchymosis along the base of his left forearm along with hematoma from prior bleeding.  Patient reports he has been doing well since then, as bruising has slowly been clearing up.  He is requesting additional medication for pain as he has been taking ibuprofen and has been unable to sleep due to pain.  He denies any fevers, chills, increasing swelling, or redness.        Past Medical History:  Diagnosis Date  . Alcohol abuse   . Asthma   . GERD (gastroesophageal reflux disease)   . Gout   . Hypertension   . Kidney stone   . OCD (obsessive compulsive disorder)   . Renal colic     Patient Active Problem List   Diagnosis Date Noted  . MDD (major depressive disorder), recurrent severe, without psychosis ([place]) 07/27/2019  . Social anxiety disorder 07/27/2019  . Left-sided chest wall pain 06/14/2019  . Alcoholic cirrhosis of liver without ascites ([place]) 06/14/2019  . Alcohol abuse 06/14/2019  . AKI (acute kidney injury) ([place]) 05/27/2019  . Alcohol withdrawal ([place]) 05/07/2019  . Alcoholic  intoxication with complication ([place])   . Sepsis ([place]) 03/08/2019  . Breast lump or mass 10/04/2018  . Sepsis secondary to UTI ([place]) 09/14/2018  . Asthma 07/07/2018  . GERD (gastroesophageal reflux disease) 07/07/2018  . OCD (obsessive compulsive disorder) 07/07/2018  . Self-inflicted laceration of wrist 03/10/2018  . Leg hematoma 12/25/2017  . Suicide and self-inflicted injury by cutting and piercing instrument ([place]) 03/10/2017  . Severe recurrent major 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 ([place]) 03/09/2017  . Substance induced mood disorder ([place]) 08/15/2016  . Involuntary commitment 08/15/2016  . Alcohol use disorder, severe, dependence ([place]) 02/05/2016  . Hypertension 12/05/2015  . Tachycardia 12/05/2015  . Gout 11/13/2015  . Chronic back pain 05/02/2015    Past Surgical History:  Procedure Laterality Date  . CHOLECYSTECTOMY  2012  . EXTRACORPOREAL SHOCK WAVE LITHOTRIPSY Left 01/12/2019   Procedure: EXTRACORPOREAL SHOCK WAVE LITHOTRIPSY (ESWL);  Surgeon: Billey Co, MD;  Location: [facility] ORS;  Service: Urology;  Laterality: Left;    Prior to Admission medications   Medication Sig Start Date End Date Taking? Authorizing Provider  allopurinol (ZYLOPRIM) 300 MG tablet Take 1 tablet (300 mg total) by mouth daily. 08/01/19   Clapacs, Madie Reno, MD  ascorbic acid (VITAMIN C) 500 MG tablet Take 1 tablet (500 mg total) by mouth daily. 08/01/19   Clapacs, Madie Reno, MD  citalopram (CELEXA) 20 MG tablet Take 1 tablet (20 mg total) by mouth daily. 08/01/19   Clapacs, Madie Reno, MD  colchicine 0.6 MG tablet Take  1 tablet (0.6 mg total) by mouth daily as needed. 08/01/19   Clapacs, Madie Reno, MD  Fluticasone-Salmeterol (ADVAIR DISKUS) 100-50 MCG/DOSE AEPB Inhale 1 puff into the lungs 2 (two) times daily. 09/01/18   Tukov-Yual, Arlyss Gandy, NP  folic acid (FOLVITE) 1 MG tablet Take 1 tablet (1 mg total) by mouth daily. 08/01/19   Clapacs, Madie Reno, MD  gabapentin (NEURONTIN) 100 MG capsule Take 2 capsules  (200 mg total) by mouth 3 (three) times daily. 08/01/19   Clapacs, Madie Reno, MD  hydrOXYzine (ATARAX/VISTARIL) 25 MG tablet Take 1 tablet (25 mg total) by mouth 3 (three) times daily as needed for anxiety. 08/01/19   Clapacs, Madie Reno, MD  lidocaine (LIDODERM) 5 % Place 1 patch onto the skin every 12 (twelve) hours for 5 days. Remove & Discard patch within 12 hours or as directed by MD 08/04/19 08/09/19  Blake Divine, MD  lisinopril (ZESTRIL) 40 MG tablet Take 1 tablet (40 mg total) by mouth daily. 08/01/19   Clapacs, Madie Reno, MD  Multiple Vitamins-Iron (MULTIVITAMINS WITH IRON) TABS tablet Take 1 tablet by mouth daily. 08/01/19   Clapacs, Madie Reno, MD  oxyCODONE-acetaminophen (PERCOCET/ROXICET) 5-325 MG tablet Take 1 tablet by mouth every 6 (six) hours as needed for moderate pain or severe pain. 08/01/19   Clapacs, Madie Reno, MD  thiamine 250 MG tablet Take 1 tablet (250 mg total) by mouth daily. 08/01/19   Clapacs, Madie Reno, MD  traZODone (DESYREL) 50 MG tablet Take 1 tablet (50 mg total) by mouth at bedtime as needed for sleep. 08/01/19   Clapacs, Madie Reno, MD    Allergies Percocet [oxycodone-acetaminophen]  Family History  Problem Relation Age of Onset  . Alcohol abuse Father   . Breast cancer Mother 25    Social History Social History   Tobacco Use  . Smoking status: Former Research scientist (life sciences)  . Smokeless tobacco: Never Used  . Tobacco comment: quit 30 years ago  Substance Use Topics  . Alcohol use: Yes    Alcohol/week: 2.0 standard drinks    Types: 2 Glasses of wine per week    Comment: daily  . Drug use: No    Review of Systems  Constitutional: No fever/chills Eyes: No visual changes. ENT: No sore throat. Cardiovascular: Denies chest pain. Respiratory: Denies shortness of breath. Gastrointestinal: No abdominal pain.  No nausea, no vomiting.  No diarrhea.  No constipation. Genitourinary: Negative for dysuria. Musculoskeletal: Negative for back pain.  Positive for left forearm pain. Skin: Negative  for rash. Neurological: Negative for headaches, focal weakness or numbness.  ____________________________________________   PHYSICAL EXAM:  VITAL SIGNS: ED Triage Vitals  Enc Vitals Group     BP 08/04/19 1807 103/65     Pulse Rate 08/04/19 1807 (!) 101     Resp 08/04/19 1807 18     Temp 08/04/19 1807 98.5 F (36.9 C)     Temp Source 08/04/19 1807 Oral     SpO2 08/04/19 1807 95 %     Weight 08/04/19 1810 197 lb (89.4 kg)     Height 08/04/19 1810 6' (1.829 m)     Head Circumference --      Peak Flow --      Pain Score 08/04/19 1807 8     Pain Loc --      Pain Edu? --      Excl. in [place]? --     Constitutional: Alert and oriented. Eyes: Conjunctivae are normal. Head: Atraumatic. Nose: No congestion/rhinnorhea. Mouth/Throat: Mucous  membranes are moist. Neck: Normal ROM Cardiovascular: Normal rate, regular rhythm. Grossly normal heart sounds.  2+ radial pulses bilaterally. Respiratory: Normal respiratory effort.  No retractions. Lungs CTAB. Gastrointestinal: Soft and nontender. No distention. Genitourinary: deferred Musculoskeletal: No lower extremity tenderness nor edema.  Approximately 1 cm laceration to ventral surface of left forearm near antecubital fossa with sutures in place.  No surrounding erythema, warmth, or tenderness.  Mild edema noted in this area along with ecchymosis along the base of his forearm. Neurologic:  Normal speech and language. No gross focal neurologic deficits are appreciated.  Strength and sensation intact throughout bilateral upper extremities. Skin:  Skin is warm, dry and intact. No rash noted. Psychiatric: Mood and affect are normal. Speech and behavior are normal.  ____________________________________________   LABS (all labs ordered are listed, but only abnormal results are displayed)  Labs Reviewed  COMPREHENSIVE METABOLIC PANEL - Abnormal; Notable for the following components:      Result Value   Potassium 3.2 (*)    Glucose, Bld 171 (*)     AST 12 (*)    All other components within normal limits  CBC WITH DIFFERENTIAL/PLATELET - Abnormal; Notable for the following components:   Hemoglobin 12.0 (*)    HCT 38.1 (*)    RDW 16.5 (*)    All other components within normal limits  URINALYSIS, COMPLETE (UACMP) WITH MICROSCOPIC - Abnormal; Notable for the following components:   Color, Urine STRAW (*)    APPearance CLEAR (*)    Specific Gravity, Urine 1.003 (*)    All other components within normal limits  LACTIC ACID, PLASMA  LACTIC ACID, PLASMA     PROCEDURES  Procedure(s) performed (including Critical Care):  Procedures   ____________________________________________   INITIAL IMPRESSION / ASSESSMENT AND PLAN / ED COURSE       Patient presenting for ongoing pain to left forearm after recently sustained arterial injury with self inflicted laceration to left forearm.  Wound appears to be healing well with no evidence of infection.  He does have a hematoma in this area and ecchymosis on his underlying forearm, which appears to be the primary source of his pain.  Counseled patient on appropriate dosing of ibuprofen and that he may alternate this with Tylenol.  Will add Lidoderm patch for additional pain control.  He is now 9 days out from injury, would recommend sutures remain in place for full 10 days given arterial injury.  Counseled patient to follow-up with his PCP and return to the ED for new or worsening symptoms, patient agrees with plan.      ____________________________________________   FINAL CLINICAL IMPRESSION(S) / ED DIAGNOSES  Final diagnoses:  Laceration of left upper extremity, subsequent encounter  Traumatic ecchymosis of left forearm, subsequent encounter  Traumatic hematoma of left forearm, subsequent encounter     ED Discharge Orders         Ordered    lidocaine (LIDODERM) 5 %  Every 12 hours     08/04/19 2142           Note:  This document was prepared using Dragon voice  recognition software and may include unintentional dictation errors.   Blake Divine, MD 08/04/19 2150

## 2019-08-09 ENCOUNTER — Emergency Department: Payer: Medicaid Other

## 2019-08-09 ENCOUNTER — Emergency Department
Admission: EM | Admit: 2019-08-09 | Discharge: 2019-08-09 | Disposition: A | Discharge status: Home / Self Care | Payer: Medicaid Other | Attending: Student | Admitting: Student

## 2019-08-09 ENCOUNTER — Other Ambulatory Visit: Payer: Self-pay

## 2019-08-09 DIAGNOSIS — Y92018 Other place in single-family (private) house as the place of occurrence of the external cause: Secondary | ICD-10-CM | POA: Insufficient documentation

## 2019-08-09 DIAGNOSIS — W010XXA Fall on same level from slipping, tripping and stumbling without subsequent striking against object, initial encounter: Secondary | ICD-10-CM | POA: Insufficient documentation

## 2019-08-09 DIAGNOSIS — S40812A Abrasion of left upper arm, initial encounter: Secondary | ICD-10-CM | POA: Insufficient documentation

## 2019-08-09 DIAGNOSIS — S0990XA Unspecified injury of head, initial encounter: Secondary | ICD-10-CM | POA: Insufficient documentation

## 2019-08-09 DIAGNOSIS — Y93E9 Activity, other interior property and clothing maintenance: Secondary | ICD-10-CM | POA: Insufficient documentation

## 2019-08-09 DIAGNOSIS — Y999 Unspecified external cause status: Secondary | ICD-10-CM | POA: Insufficient documentation

## 2019-08-09 DIAGNOSIS — I10 Essential (primary) hypertension: Secondary | ICD-10-CM | POA: Insufficient documentation

## 2019-08-09 DIAGNOSIS — W19XXXA Unspecified fall, initial encounter: Secondary | ICD-10-CM

## 2019-08-09 DIAGNOSIS — J45909 Unspecified asthma, uncomplicated: Secondary | ICD-10-CM | POA: Insufficient documentation

## 2019-08-09 DIAGNOSIS — Z87891 Personal history of nicotine dependence: Secondary | ICD-10-CM | POA: Insufficient documentation

## 2019-08-09 DIAGNOSIS — Z79899 Other long term (current) drug therapy: Secondary | ICD-10-CM | POA: Insufficient documentation

## 2019-08-09 MED ORDER — MELOXICAM 15 MG PO TABS: 15.0000 mg | ORAL_TABLET | Freq: Every day | ORAL | 1 refills | Status: DC

## 2019-08-09 NOTE — ED Notes (Signed)
Pt not currently in room. Will assess once pt to room.
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
## 2019-08-09 NOTE — ED Triage Notes (Addendum)
Reports that he was taking out trash today, fell and is having left arm pain. Pt unsure if he hit his head, poor historian. Pt alert and oriented X4, cooperative, RR even and unlabored, color WNL. Pt in NAD. Denies alcohol or drug use. Pt states he did not take his anxiety medications today. Pt denies SI or HI.
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
## 2019-08-16 ENCOUNTER — Telehealth: Payer: Self-pay

## 2019-08-16 ENCOUNTER — Encounter: Payer: Self-pay | Admitting: Gerontology

## 2019-08-16 ENCOUNTER — Ambulatory Visit: Payer: Medicaid Other | Admitting: Gerontology

## 2019-08-16 ENCOUNTER — Other Ambulatory Visit: Payer: Self-pay

## 2019-08-16 VITALS — BP 165/102 | HR 94 | Temp 97.9°F | Wt 195.0 lb

## 2019-08-16 DIAGNOSIS — I10 Essential (primary) hypertension: Secondary | ICD-10-CM

## 2019-08-16 DIAGNOSIS — F1020 Alcohol dependence, uncomplicated: Secondary | ICD-10-CM

## 2019-08-16 NOTE — Patient Instructions (Signed)

## 2019-08-16 NOTE — Progress Notes (Signed)
Established Patient Office Visit  Subjective:  Patient ID: Miguel [name], male    DOB: 02-05-1961  Age: 58 y.o. MRN: [id number]  CC:  Chief Complaint  Patient presents with  . Hypertension    HPI Miguel [name] presents for follow up after multiple ED visits and he is non compliant with his clinic appointments. He was recently evaluated at the ED on 08/09/2019 for left arm pain after a fall. He denies pain to left arm and reports that he's feeling better. He states that he is compliant with his medications. He checks his blood pressure at home and he reports that his SBP readings are in the 120's to 160's, and DBP  Is usually below 90. He denies chest pain, palpitation, light headedness and cough. He states that he drinks alcoholic beverages ones in a while and he had 2 small bottles of wine 2 days ago. He reports mild peripheral tremors, states that he's feeling much better, and offers no further complaint.  Past Medical History:  Diagnosis Date  . Alcohol abuse   . Asthma   . GERD (gastroesophageal reflux disease)   . Gout   . Hypertension   . Kidney stone   . OCD (obsessive compulsive disorder)   . Renal colic     Past Surgical History:  Procedure Laterality Date  . CHOLECYSTECTOMY  2012  . EXTRACORPOREAL SHOCK WAVE LITHOTRIPSY Left 01/12/2019   Procedure: EXTRACORPOREAL SHOCK WAVE LITHOTRIPSY (ESWL);  Surgeon: Billey Co, MD;  Location: [facility] ORS;  Service: Urology;  Laterality: Left;    Family History  Problem Relation Age of Onset  . Alcohol abuse Father   . Breast cancer Mother 76    Social History   Socioeconomic History  . Marital status: Married    Spouse name: Not on file  . Number of children: Not on file  . Years of education: Not on file  . Highest education level: Not on file  Occupational History  . Not on file  Social Needs  . Financial resource strain: Very hard  . Food insecurity    Worry: Never true    Inability: Never true  . Transportation  needs    Medical: No    Non-medical: No  Tobacco Use  . Smoking status: Former Research scientist (life sciences)  . Smokeless tobacco: Never Used  . Tobacco comment: quit 30 years ago  Substance and Sexual Activity  . Alcohol use: Yes    Alcohol/week: 2.0 standard drinks    Types: 2 Glasses of wine per week    Comment: daily  . Drug use: No  . Sexual activity: Yes  Lifestyle  . Physical activity    Days per week: 2 days    Minutes per session: 10 min  . Stress: Very much  Relationships  . Social Herbalist on phone: Once a week    Gets together: Not on file    Attends religious service: Never    Active member of club or organization: No    Attends meetings of clubs or organizations: Never    Relationship status: Married  . Intimate partner violence    Fear of current or ex partner: No    Emotionally abused: No    Physically abused: No    Forced sexual activity: No  Other Topics Concern  . Not on file  Social History Narrative   Lives at home with his wife and takes care of his wife. Independent at baseline      -  Biggest strain is financial but doesn't think they could got get more help.      Patient expressed interest in possible financial assistance. Informed written consent obtained    Outpatient Medications Prior to Visit  Medication Sig Dispense Refill  . allopurinol (ZYLOPRIM) 300 MG tablet Take 1 tablet (300 mg total) by mouth daily. 30 tablet 1  . ascorbic acid (VITAMIN C) 500 MG tablet Take 1 tablet (500 mg total) by mouth daily. 30 tablet 1  . citalopram (CELEXA) 20 MG tablet Take 1 tablet (20 mg total) by mouth daily. 30 tablet 1  . colchicine 0.6 MG tablet Take 1 tablet (0.6 mg total) by mouth daily as needed. 30 tablet 1  . Fluticasone-Salmeterol (ADVAIR DISKUS) 100-50 MCG/DOSE AEPB Inhale 1 puff into the lungs 2 (two) times daily. 99991111 each 0  . folic acid (FOLVITE) 1 MG tablet Take 1 tablet (1 mg total) by mouth daily. 30 tablet 1  . gabapentin (NEURONTIN) 100 MG  capsule Take 2 capsules (200 mg total) by mouth 3 (three) times daily. 90 capsule 1  . hydrOXYzine (ATARAX/VISTARIL) 25 MG tablet Take 1 tablet (25 mg total) by mouth 3 (three) times daily as needed for anxiety. 60 tablet 1  . lisinopril (ZESTRIL) 40 MG tablet Take 1 tablet (40 mg total) by mouth daily. 30 tablet 1  . Multiple Vitamins-Iron (MULTIVITAMINS WITH IRON) TABS tablet Take 1 tablet by mouth daily. 30 tablet 1  . traZODone (DESYREL) 50 MG tablet Take 1 tablet (50 mg total) by mouth at bedtime as needed for sleep. 30 tablet 1  . thiamine 250 MG tablet Take 1 tablet (250 mg total) by mouth daily. 30 tablet 1  . meloxicam (MOBIC) 15 MG tablet Take 1 tablet (15 mg total) by mouth daily for 7 days. (Patient not taking: Reported on 08/16/2019) 30 tablet 1  . oxyCODONE-acetaminophen (PERCOCET/ROXICET) 5-325 MG tablet Take 1 tablet by mouth every 6 (six) hours as needed for moderate pain or severe pain. (Patient not taking: Reported on 08/16/2019) 6 tablet 0   No facility-administered medications prior to visit.     Allergies  Allergen Reactions  . Percocet [Oxycodone-Acetaminophen] Nausea And Vomiting    ROS Review of Systems  Constitutional: Negative.   Respiratory: Negative.   Cardiovascular: Negative.   Gastrointestinal: Negative.   Musculoskeletal: Negative.   Skin: Negative.   Neurological: Positive for tremors (mild pheripheral tremor).  Psychiatric/Behavioral: Negative.       Objective:    Physical Exam  Constitutional: He is oriented to person, place, and time. He appears well-developed.  HENT:  Head: Normocephalic and atraumatic.  Eyes: Pupils are equal, round, and reactive to light.  Cardiovascular: Normal rate and regular rhythm.  Pulmonary/Chest: Effort normal and breath sounds normal.  Musculoskeletal: Normal range of motion.  Neurological: He is alert and oriented to person, place, and time.  Mild peripheral tremors to fingers .  Skin: Skin is warm and dry.   Psychiatric: He has a normal mood and affect. His behavior is normal. Judgment and thought content normal.    BP (!) 165/102 (BP Location: Right Arm, Patient Position: Sitting, Cuff Size: Normal)   Pulse 94   Temp 97.9 F (36.6 C)   Wt 195 lb (88.5 kg)   BMI 26.45 kg/m  Wt Readings from Last 3 Encounters:  08/16/19 195 lb (88.5 kg)  08/09/19 196 lb 3.4 oz (89 kg)  08/04/19 197 lb (89.4 kg)     Health Maintenance Due  Topic Date  Due  . Hepatitis C Screening  15-Apr-1961  . COLONOSCOPY  10/11/2009    There are no preventive care reminders to display for this patient.  Lab Results  Component Value Date   TSH 0.545 05/07/2019   Lab Results  Component Value Date   WBC 5.8 08/04/2019   HGB 12.0 (L) 08/04/2019   HCT 38.1 (L) 08/04/2019   MCV 88.2 08/04/2019   PLT 280 08/04/2019   Lab Results  Component Value Date   NA 141 08/04/2019   K 3.2 (L) 08/04/2019   CO2 24 08/04/2019   GLUCOSE 171 (H) 08/04/2019   BUN 12 08/04/2019   CREATININE 1.16 08/04/2019   BILITOT 0.6 08/04/2019   ALKPHOS 59 08/04/2019   AST 12 (L) 08/04/2019   ALT 11 08/04/2019   PROT 6.6 08/04/2019   ALBUMIN 3.8 08/04/2019   CALCIUM 9.1 08/04/2019   ANIONGAP 11 08/04/2019   Lab Results  Component Value Date   CHOL 144 03/12/2017   Lab Results  Component Value Date   HDL 52 03/12/2017   Lab Results  Component Value Date   LDLCALC 71 03/12/2017   Lab Results  Component Value Date   TRIG 107 03/12/2017   Lab Results  Component Value Date   CHOLHDL 2.8 03/12/2017   Lab Results  Component Value Date   HGBA1C 5.1 03/12/2017      Assessment & Plan:    1. Essential hypertension - His blood pressure is not controlled, he will continue on 40 mg Lisinopril daily. - He was encouraged to check blood pressure in the morning prior to taking Lisinopril and around 4 pm. He's to document and bring log to office visit next week. -Low salt DASH diet -Exercise regularly as tolerated -Goal  is less than 140/90 and normal blood pressure is 120/80    2. Alcohol use disorder, severe, dependence ([place]) - He was strongly encouraged on abstinence from alcohol. - He was encouraged to follow up at [facility], and was advised to notify clinic or go to the ED with withdrawal symptoms.    Follow-up: Return in about 8 days (around 08/24/2019), or if symptoms worsen or fail to improve.    Jerrik Housholder Jerold Coombe, NP

## 2019-08-16 NOTE — Telephone Encounter (Signed)
Called to notify patient his medical records were ready for pickup.  He will stop by [facility] on 08/17/2019 sometime between 10am - 7:30pm.

## 2019-08-17 ENCOUNTER — Encounter: Payer: Self-pay | Admitting: Urology

## 2019-08-17 ENCOUNTER — Ambulatory Visit: Payer: Self-pay | Admitting: Urology

## 2019-08-21 IMAGING — CR DG CHEST 2V
2 series · 2 of 2 positions shown · non-contrast
Comparison: 11/05/2018

CLINICAL DATA: Central chest pain and burning.

EXAM:
CHEST - 2 VIEW

[chest pa]
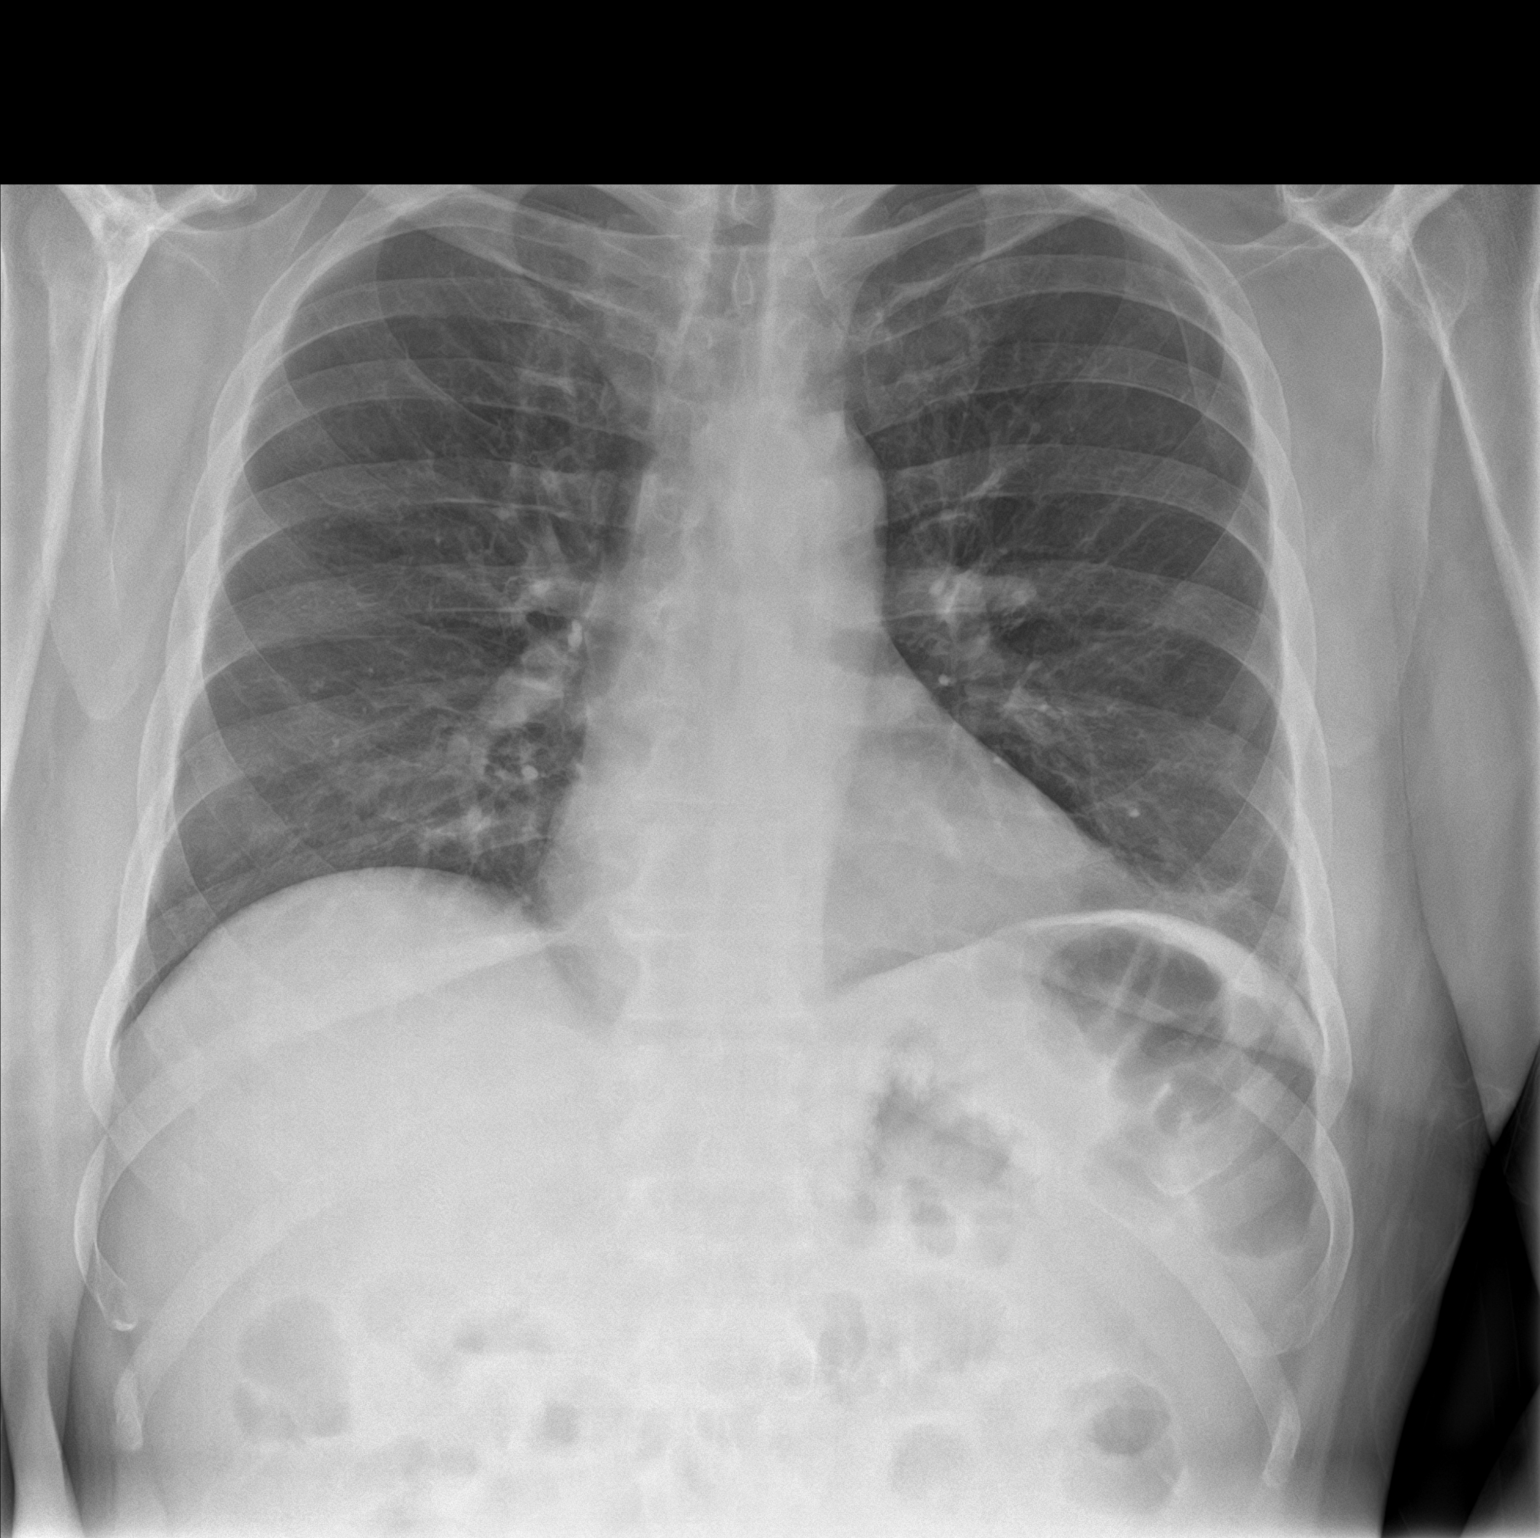

[chest lat]
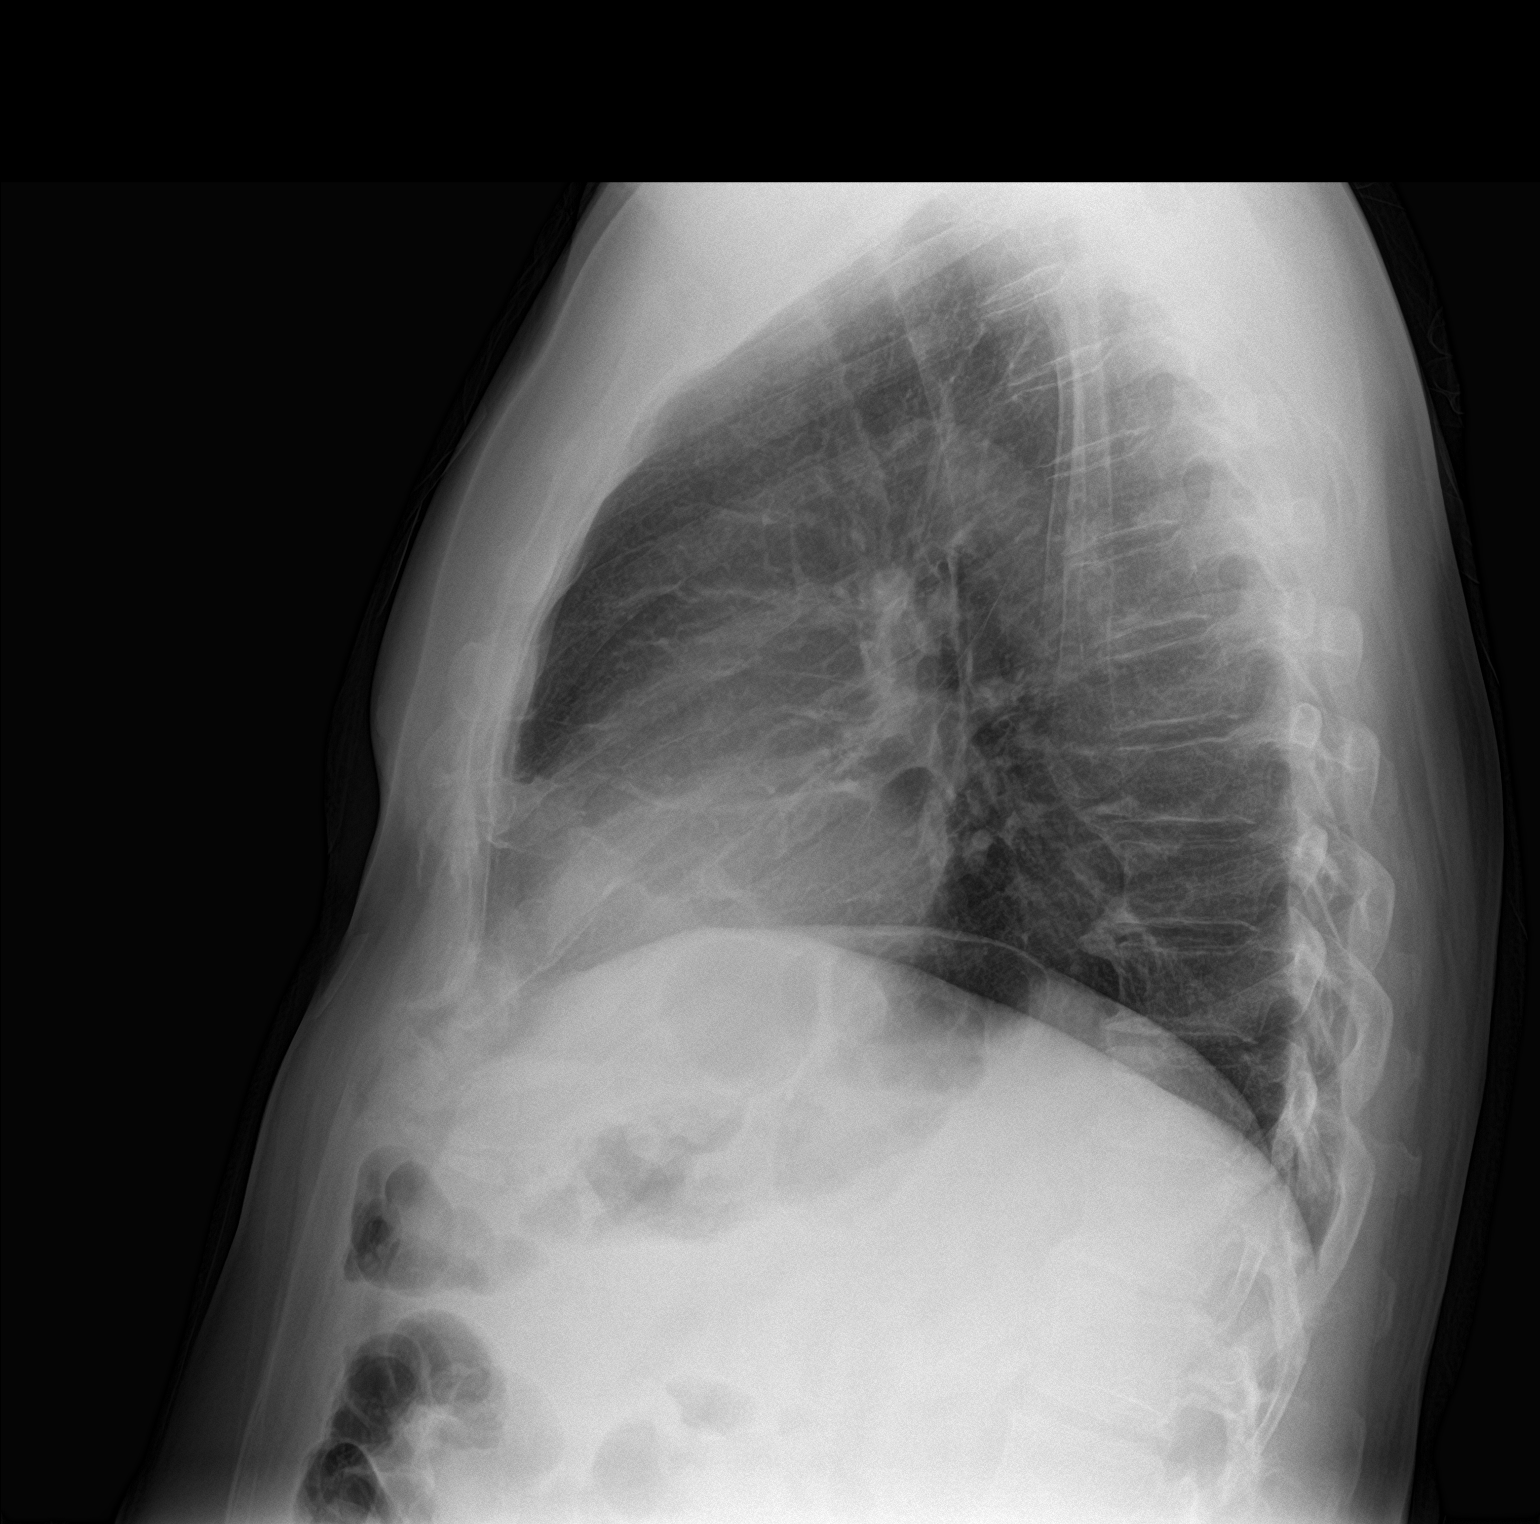

[2 of 2 positions shown; findings below may reference images not displayed]

FINDINGS: The heart size and mediastinal contours are within normal limits.
Minimal left basilar atelectasis. No acute pulmonary consolidation
or edema. No effusion or pneumothorax. No free air beneath the
diaphragm. The visualized skeletal structures are unremarkable.
IMPRESSION: No active cardiopulmonary disease.

## 2019-08-24 ENCOUNTER — Other Ambulatory Visit: Payer: Self-pay

## 2019-08-24 ENCOUNTER — Ambulatory Visit: Payer: Medicaid Other | Admitting: Gerontology

## 2019-08-24 ENCOUNTER — Encounter: Payer: Self-pay | Admitting: Gerontology

## 2019-08-24 VITALS — BP 163/97 | HR 85 | Temp 98.1°F | Ht 72.0 in | Wt 194.0 lb

## 2019-08-24 DIAGNOSIS — Z5321 Procedure and treatment not carried out due to patient leaving prior to being seen by health care provider: Secondary | ICD-10-CM | POA: Insufficient documentation

## 2019-08-24 DIAGNOSIS — F4010 Social phobia, unspecified: Secondary | ICD-10-CM

## 2019-08-24 DIAGNOSIS — F1010 Alcohol abuse, uncomplicated: Secondary | ICD-10-CM

## 2019-08-24 DIAGNOSIS — I10 Essential (primary) hypertension: Secondary | ICD-10-CM

## 2019-08-24 MED ORDER — CLONIDINE HCL 0.1 MG PO TABS: 0.1000 mg | ORAL_TABLET | Freq: Every day | ORAL | 0 refills | Status: DC

## 2019-08-24 NOTE — Progress Notes (Signed)
Established Patient Office Visit  Subjective:  Patient ID: Miguel [name], male    DOB: 1961/06/27  Age: 58 y.o. MRN: [id number]  CC:  Chief Complaint  Patient presents with  . Hypertension    HPI Miguel [name] presents for follow up of uncontrolled hypertension and alcohol abuse. He reports that he is compliant with his medications, checks his blood pressure at home,  but forgot to bring his blood pressure log. He states that his SBP runs in the low 160's and DBP in the low 100's when checked in the morning, but it's usually 150's/ less than 90 when checked in the afternoon. He reports that he is compliant with DASH diet and exercises as tolerated. He denies chest pain, palpitation, light headedness and cough.   He reports that he's cutting back on his alcohol consumption, had 2 small bottles of wine four days ago and he denies any withdrawal symptoms. He states that his mood is good, denies suicidal or homicidal ideation, and will schedule an appointment with [place]. He states that he's doing well and denies any complaint.   Past Medical History:  Diagnosis Date  . Alcohol abuse   . Asthma   . GERD (gastroesophageal reflux disease)   . Gout   . Hypertension   . Kidney stone   . OCD (obsessive compulsive disorder)   . Renal colic     Past Surgical History:  Procedure Laterality Date  . CHOLECYSTECTOMY  2012  . EXTRACORPOREAL SHOCK WAVE LITHOTRIPSY Left 01/12/2019   Procedure: EXTRACORPOREAL SHOCK WAVE LITHOTRIPSY (ESWL);  Surgeon: Billey Co, MD;  Location: [facility] ORS;  Service: Urology;  Laterality: Left;    Family History  Problem Relation Age of Onset  . Alcohol abuse Father   . Breast cancer Mother 2    Social History   Socioeconomic History  . Marital status: Married    Spouse name: Not on file  . Number of children: Not on file  . Years of education: Not on file  . Highest education level: Not on file  Occupational History  . Not on file  Social Needs  .  Financial resource strain: Very hard  . Food insecurity    Worry: Never true    Inability: Never true  . Transportation needs    Medical: No    Non-medical: No  Tobacco Use  . Smoking status: Former Research scientist (life sciences)  . Smokeless tobacco: Never Used  . Tobacco comment: quit 30 years ago  Substance and Sexual Activity  . Alcohol use: Yes    Alcohol/week: 2.0 standard drinks    Types: 2 Glasses of wine per week    Comment: daily  . Drug use: No  . Sexual activity: Yes  Lifestyle  . Physical activity    Days per week: 2 days    Minutes per session: 10 min  . Stress: Very much  Relationships  . Social Herbalist on phone: Once a week    Gets together: Not on file    Attends religious service: Never    Active member of club or organization: No    Attends meetings of clubs or organizations: Never    Relationship status: Married  . Intimate partner violence    Fear of current or ex partner: No    Emotionally abused: No    Physically abused: No    Forced sexual activity: No  Other Topics Concern  . Not on file  Social History Narrative  Lives at home with his wife and takes care of his wife. Independent at baseline      - Biggest strain is financial but doesn't think they could got get more help.      Patient expressed interest in possible financial assistance. Informed written consent obtained    Outpatient Medications Prior to Visit  Medication Sig Dispense Refill  . allopurinol (ZYLOPRIM) 300 MG tablet Take 1 tablet (300 mg total) by mouth daily. 30 tablet 1  . ascorbic acid (VITAMIN C) 500 MG tablet Take 1 tablet (500 mg total) by mouth daily. 30 tablet 1  . citalopram (CELEXA) 20 MG tablet Take 1 tablet (20 mg total) by mouth daily. 30 tablet 1  . colchicine 0.6 MG tablet Take 1 tablet (0.6 mg total) by mouth daily as needed. 30 tablet 1  . Fluticasone-Salmeterol (ADVAIR DISKUS) 100-50 MCG/DOSE AEPB Inhale 1 puff into the lungs 2 (two) times daily. 99991111 each 0  .  folic acid (FOLVITE) 1 MG tablet Take 1 tablet (1 mg total) by mouth daily. 30 tablet 1  . gabapentin (NEURONTIN) 100 MG capsule Take 2 capsules (200 mg total) by mouth 3 (three) times daily. 90 capsule 1  . hydrOXYzine (ATARAX/VISTARIL) 25 MG tablet Take 1 tablet (25 mg total) by mouth 3 (three) times daily as needed for anxiety. 60 tablet 1  . lisinopril (ZESTRIL) 40 MG tablet Take 1 tablet (40 mg total) by mouth daily. 30 tablet 1  . Multiple Vitamins-Iron (MULTIVITAMINS WITH IRON) TABS tablet Take 1 tablet by mouth daily. 30 tablet 1  . thiamine 250 MG tablet Take 1 tablet (250 mg total) by mouth daily. 30 tablet 1  . traZODone (DESYREL) 50 MG tablet Take 1 tablet (50 mg total) by mouth at bedtime as needed for sleep. 30 tablet 1   No facility-administered medications prior to visit.     Allergies  Allergen Reactions  . Percocet [Oxycodone-Acetaminophen] Nausea And Vomiting    ROS Review of Systems  Constitutional: Negative.   Eyes: Negative.   Respiratory: Negative.   Cardiovascular: Negative.   Skin: Negative.   Neurological: Negative.   Psychiatric/Behavioral: Negative.       Objective:    Physical Exam  Constitutional: He is oriented to person, place, and time. He appears well-developed and well-nourished.  HENT:  Head: Normocephalic and atraumatic.  Eyes: Pupils are equal, round, and reactive to light. EOM are normal.  Cardiovascular: Normal rate and regular rhythm.  Pulmonary/Chest: Effort normal and breath sounds normal.  Musculoskeletal: Normal range of motion.  Neurological: He is alert and oriented to person, place, and time.  Skin: Skin is warm and dry.  Psychiatric: He has a normal mood and affect. His behavior is normal. Judgment and thought content normal.    BP (!) 163/97 (BP Location: Right Arm, Patient Position: Sitting)   Pulse 85   Temp 98.1 F (36.7 C)   Ht 6' (1.829 m)   Wt 194 lb (88 kg)   BMI 26.31 kg/m  Wt Readings from Last 3 Encounters:   08/24/19 194 lb (88 kg)  08/16/19 195 lb (88.5 kg)  08/09/19 196 lb 3.4 oz (89 kg)     Health Maintenance Due  Topic Date Due  . Hepatitis C Screening  [birth year]  . COLONOSCOPY  10/11/2009    There are no preventive care reminders to display for this patient.  Lab Results  Component Value Date   TSH 0.545 05/07/2019   Lab Results  Component Value Date  WBC 5.8 08/04/2019   HGB 12.0 (L) 08/04/2019   HCT 38.1 (L) 08/04/2019   MCV 88.2 08/04/2019   PLT 280 08/04/2019   Lab Results  Component Value Date   NA 141 08/04/2019   K 3.2 (L) 08/04/2019   CO2 24 08/04/2019   GLUCOSE 171 (H) 08/04/2019   BUN 12 08/04/2019   CREATININE 1.16 08/04/2019   BILITOT 0.6 08/04/2019   ALKPHOS 59 08/04/2019   AST 12 (L) 08/04/2019   ALT 11 08/04/2019   PROT 6.6 08/04/2019   ALBUMIN 3.8 08/04/2019   CALCIUM 9.1 08/04/2019   ANIONGAP 11 08/04/2019   Lab Results  Component Value Date   CHOL 144 03/12/2017   Lab Results  Component Value Date   HDL 52 03/12/2017   Lab Results  Component Value Date   LDLCALC 71 03/12/2017   Lab Results  Component Value Date   TRIG 107 03/12/2017   Lab Results  Component Value Date   CHOLHDL 2.8 03/12/2017   Lab Results  Component Value Date   HGBA1C 5.1 03/12/2017      Assessment & Plan:     1. Essential hypertension - His blood pressure is uncontrolled and he will start clonidine 0.1 mg daily, he was advised to check his blood pressure daily, record and bring log to clinic. His goal blood pressure is <140/90. He will continue on dash diet and exercise as tolerated. He was also advised to notify clinic next week if his blood pressure continue to be in the 160's, might increase clonidine to bid. - cloNIDine (CATAPRES) 0.1 MG tablet; Take 1 tablet (0.1 mg total) by mouth daily.  Dispense: 30 tablet; Refill: 0  2. Alcohol abuse - He was strongly encouraged to abstain from alcohol  3. Social anxiety disorder - He will continue on  current treatment regimen and was advised to schedule an appointment with [facility] for his mental health care.   Follow-up: Return in about 3 weeks (around 09/14/2019), or if symptoms worsen or fail to improve.    Temeka Pore Jerold Coombe, NP

## 2019-08-24 NOTE — ED Triage Notes (Addendum)
Pt fell today states hit his forehead, states has been drinking today. States did not pass out, pt ambulatory to triage. Positive etoh use.

## 2019-08-24 NOTE — Patient Instructions (Signed)

## 2019-08-25 ENCOUNTER — Emergency Department
Admission: EM | Admit: 2019-08-25 | Discharge: 2019-08-25 | Disposition: A | Discharge status: Home / Self Care | Payer: Medicaid Other | Attending: Emergency Medicine | Admitting: Emergency Medicine

## 2019-08-25 NOTE — ED Notes (Signed)
No answer when called several times from lobby 

## 2019-08-26 ENCOUNTER — Other Ambulatory Visit: Payer: Self-pay

## 2019-08-26 ENCOUNTER — Emergency Department: Payer: Medicaid Other

## 2019-08-26 ENCOUNTER — Emergency Department
Admission: EM | Admit: 2019-08-26 | Discharge: 2019-08-26 | Disposition: A | Discharge status: Home / Self Care | Payer: Medicaid Other | Attending: Emergency Medicine | Admitting: Emergency Medicine

## 2019-08-26 DIAGNOSIS — Z79899 Other long term (current) drug therapy: Secondary | ICD-10-CM | POA: Insufficient documentation

## 2019-08-26 DIAGNOSIS — W010XXA Fall on same level from slipping, tripping and stumbling without subsequent striking against object, initial encounter: Secondary | ICD-10-CM | POA: Insufficient documentation

## 2019-08-26 DIAGNOSIS — Z87891 Personal history of nicotine dependence: Secondary | ICD-10-CM | POA: Insufficient documentation

## 2019-08-26 DIAGNOSIS — J45909 Unspecified asthma, uncomplicated: Secondary | ICD-10-CM | POA: Insufficient documentation

## 2019-08-26 DIAGNOSIS — Y929 Unspecified place or not applicable: Secondary | ICD-10-CM | POA: Insufficient documentation

## 2019-08-26 DIAGNOSIS — Y939 Activity, unspecified: Secondary | ICD-10-CM | POA: Insufficient documentation

## 2019-08-26 DIAGNOSIS — Y999 Unspecified external cause status: Secondary | ICD-10-CM | POA: Insufficient documentation

## 2019-08-26 DIAGNOSIS — S62315A Displaced fracture of base of fourth metacarpal bone, left hand, initial encounter for closed fracture: Secondary | ICD-10-CM | POA: Insufficient documentation

## 2019-08-26 DIAGNOSIS — I10 Essential (primary) hypertension: Secondary | ICD-10-CM | POA: Insufficient documentation

## 2019-08-26 NOTE — ED Provider Notes (Signed)
[facility] Emergency Department Provider Note  ____________________________________________  Time seen: Approximately 9:19 PM  I have reviewed the triage vital signs and the nursing notes.   HISTORY  Chief Complaint Arm Injury    HPI Miguel [name] is a 58 y.o. male who presents the emergency department complaining of a fall 3 days ago injuring his left arm.  Patient is well-known to this department having had multiple similar injuries he even over the past several weeks.  Patient states that he fell onto an outstretched hand is having diffuse pain from the forearm into the hand.  Patient did not hit his head or lose consciousness. After reporting to the patient x-ray findings, patient states that this was a fall 3 weeks ago.   Patient still maintains no other injuries or complaints at this time other than arm pain.  Patient has a medical history as described below.  No chronic complaints at this time.  Patient does have a history of alcohol abuse and does appear to have consumed alcohol prior to arrival..  Patient is still competent at this time to make medical decisions for himself as he is alert and oriented x4.        Past Medical History:  Diagnosis Date  . Alcohol abuse   . Asthma   . GERD (gastroesophageal reflux disease)   . Gout   . Hypertension   . Kidney stone   . OCD (obsessive compulsive disorder)   . Renal colic     Patient Active Problem List   Diagnosis Date Noted  . MDD (major depressive disorder), recurrent severe, without psychosis ([place]) 07/27/2019  . Social anxiety disorder 07/27/2019  . Left-sided chest wall pain 06/14/2019  . Alcoholic cirrhosis of liver without ascites ([place]) 06/14/2019  . Alcohol abuse 06/14/2019  . AKI (acute kidney injury) ([place]) 05/27/2019  . Alcohol withdrawal ([place]) 05/07/2019  . Alcoholic intoxication with complication ([place])   . Sepsis ([place]) 03/08/2019  . Breast lump or mass 10/04/2018  . Sepsis secondary  to UTI ([place]) 09/14/2018  . Asthma 07/07/2018  . GERD (gastroesophageal reflux disease) 07/07/2018  . OCD (obsessive compulsive disorder) 07/07/2018  . Self-inflicted laceration of wrist ([place]) 03/10/2018  . Leg hematoma 12/25/2017  . Suicide and self-inflicted injury by cutting and piercing instrument ([place]) 03/10/2017  . Severe recurrent major 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 ([place]) 03/09/2017  . Substance induced mood disorder ([place]) 08/15/2016  . Involuntary commitment 08/15/2016  . Alcohol use disorder, severe, dependence ([place]) 02/05/2016  . Hypertension 12/05/2015  . Tachycardia 12/05/2015  . Gout 11/13/2015  . Chronic back pain 05/02/2015    Past Surgical History:  Procedure Laterality Date  . CHOLECYSTECTOMY  2012  . EXTRACORPOREAL SHOCK WAVE LITHOTRIPSY Left 01/12/2019   Procedure: EXTRACORPOREAL SHOCK WAVE LITHOTRIPSY (ESWL);  Surgeon: Billey Co, MD;  Location: [facility] ORS;  Service: Urology;  Laterality: Left;    Prior to Admission medications   Medication Sig Start Date End Date Taking? Authorizing Provider  allopurinol (ZYLOPRIM) 300 MG tablet Take 1 tablet (300 mg total) by mouth daily. 08/01/19   Clapacs, Madie Reno, MD  ascorbic acid (VITAMIN C) 500 MG tablet Take 1 tablet (500 mg total) by mouth daily. 08/01/19   Clapacs, Madie Reno, MD  citalopram (CELEXA) 20 MG tablet Take 1 tablet (20 mg total) by mouth daily. 08/01/19   Clapacs, Madie Reno, MD  cloNIDine (CATAPRES) 0.1 MG tablet Take 1 tablet (0.1 mg total) by mouth daily. 08/24/19   Iloabachie, Chioma  E, NP  colchicine 0.6 MG tablet Take 1 tablet (0.6 mg total) by mouth daily as needed. 08/01/19   Clapacs, Madie Reno, MD  Fluticasone-Salmeterol (ADVAIR DISKUS) 100-50 MCG/DOSE AEPB Inhale 1 puff into the lungs 2 (two) times daily. 09/01/18   Tukov-Yual, Arlyss Gandy, NP  folic acid (FOLVITE) 1 MG tablet Take 1 tablet (1 mg total) by mouth daily. 08/01/19   Clapacs, Madie Reno, MD  gabapentin (NEURONTIN) 100 MG capsule Take 2 capsules  (200 mg total) by mouth 3 (three) times daily. 08/01/19   Clapacs, Madie Reno, MD  hydrOXYzine (ATARAX/VISTARIL) 25 MG tablet Take 1 tablet (25 mg total) by mouth 3 (three) times daily as needed for anxiety. 08/01/19   Clapacs, Madie Reno, MD  lisinopril (ZESTRIL) 40 MG tablet Take 1 tablet (40 mg total) by mouth daily. 08/01/19   Clapacs, Madie Reno, MD  Multiple Vitamins-Iron (MULTIVITAMINS WITH IRON) TABS tablet Take 1 tablet by mouth daily. 08/01/19   Clapacs, Madie Reno, MD  thiamine 250 MG tablet Take 1 tablet (250 mg total) by mouth daily. 08/01/19   Clapacs, Madie Reno, MD  traZODone (DESYREL) 50 MG tablet Take 1 tablet (50 mg total) by mouth at bedtime as needed for sleep. 08/01/19   Clapacs, Madie Reno, MD    Allergies Percocet [oxycodone-acetaminophen]  Family History  Problem Relation Age of Onset  . Alcohol abuse Father   . Breast cancer Mother 70    Social History Social History   Tobacco Use  . Smoking status: Former Research scientist (life sciences)  . Smokeless tobacco: Never Used  . Tobacco comment: quit 30 years ago  Substance Use Topics  . Alcohol use: Yes    Alcohol/week: 2.0 standard drinks    Types: 2 Glasses of wine per week    Comment: daily  . Drug use: No     Review of Systems  Constitutional: No fever/chills Eyes: No visual changes. No discharge ENT: No upper respiratory complaints. Cardiovascular: no chest pain. Respiratory: no cough. No SOB. Gastrointestinal: No abdominal pain.  No nausea, no vomiting. Musculoskeletal: Positive for left arm pain from hand to elbow Skin: Negative for rash, abrasions, lacerations, ecchymosis. Neurological: Negative for headaches, focal weakness or numbness. 10-point ROS otherwise negative.  ____________________________________________   PHYSICAL EXAM:  VITAL SIGNS: ED Triage Vitals  Enc Vitals Group     BP 08/26/19 2007 106/71     Pulse Rate 08/26/19 2006 86     Resp 08/26/19 2006 16     Temp 08/26/19 2006 98.6 F (37 C)     Temp Source 08/26/19 2006  Oral     SpO2 08/26/19 2006 98 %     Weight 08/26/19 2006 192 lb (87.1 kg)     Height 08/26/19 2006 6' (1.829 m)     Head Circumference --      Peak Flow --      Pain Score 08/26/19 2006 10     Pain Loc --      Pain Edu? --      Excl. in [place]? --      Constitutional: Alert and oriented. Well appearing and in no acute distress. Eyes: Conjunctivae are normal. PERRL. EOMI. Head: Atraumatic. Neck: No stridor.    Cardiovascular: Normal rate, regular rhythm. Normal S1 and S2.  Good peripheral circulation. Respiratory: Normal respiratory effort without tachypnea or retractions. Lungs CTAB. Good air entry to the bases with no decreased or absent breath sounds. Musculoskeletal: Full range of motion to all extremities. No gross deformities appreciated.  Visualization of the left arm reveals ecchymosis to the dorsal aspect of the left hand.  No edema, deformity.  No abrasions or lacerations.  Patient is able to move the elbow, wrist, hand and all digits appropriately.  Patient is reportedly diffusely tender to palpation from the metacarpal region to the olecranon process.  Patient appears slightly more tender to palpation over the fourth and fifth metacarpals than elsewhere.  No palpable abnormality in this region.  Full range of motion of all digits.  Sensation and capillary refill intact all digits. Neurologic:  Normal speech and language. No gross focal neurologic deficits are appreciated.  Skin:  Skin is warm, dry and intact. No rash noted. Psychiatric: Mood and affect are normal. Speech and behavior are normal. Patient exhibits appropriate insight and judgement.   ____________________________________________   LABS (all labs ordered are listed, but only abnormal results are displayed)  Labs Reviewed - No data to display ____________________________________________  EKG   ____________________________________________  RADIOLOGY I personally viewed and evaluated these images as part of my  medical decision making, as well as reviewing the written report by the radiologist.  Dg Forearm Left  Result Date: 08/26/2019 CLINICAL DATA:  Pt states fell 3 days ago injuring left arm. Pt complains of wrist and forearm pain. Pt appears in no acute distress. best images attainable, pt shakey and unsteady in lateral position EXAM: LEFT FOREARM - 2 VIEW; LEFT WRIST - COMPLETE 3+ VIEW; LEFT HAND - COMPLETE 3+ VIEW COMPARISON:  None. FINDINGS: Right hand: No fracture or dislocation in the hand. No focal bony lesion. Minimal degenerative change. Soft tissues are unremarkable. Right wrist: There is a small displaced bony fragment at the base of the fourth metatarsal, lateral aspect. There is possible intra-articular involvement. No evidence of dislocation. No additional fractures are identified in the wrist. Right forearm: No evidence of fracture or dislocation in the tibia or fibula. Regional soft tissues are unremarkable. IMPRESSION: 1. Small displaced fracture fragment at the base of the fourth metatarsal of the left hand, lateral aspect. Possible intra-articular involvement. 2. No additional acute osseous abnormality in the hand, wrist or forearm. Electronically Signed   By: Audie Pinto M.D.   On: 08/26/2019 20:42   Dg Wrist Complete Left  Result Date: 08/26/2019 CLINICAL DATA:  Pt states fell 3 days ago injuring left arm. Pt complains of wrist and forearm pain. Pt appears in no acute distress. best images attainable, pt shakey and unsteady in lateral position EXAM: LEFT FOREARM - 2 VIEW; LEFT WRIST - COMPLETE 3+ VIEW; LEFT HAND - COMPLETE 3+ VIEW COMPARISON:  None. FINDINGS: Right hand: No fracture or dislocation in the hand. No focal bony lesion. Minimal degenerative change. Soft tissues are unremarkable. Right wrist: There is a small displaced bony fragment at the base of the fourth metatarsal, lateral aspect. There is possible intra-articular involvement. No evidence of dislocation. No additional  fractures are identified in the wrist. Right forearm: No evidence of fracture or dislocation in the tibia or fibula. Regional soft tissues are unremarkable. IMPRESSION: 1. Small displaced fracture fragment at the base of the fourth metatarsal of the left hand, lateral aspect. Possible intra-articular involvement. 2. No additional acute osseous abnormality in the hand, wrist or forearm. Electronically Signed   By: Audie Pinto M.D.   On: 08/26/2019 20:42   Dg Hand Complete Left  Result Date: 08/26/2019 CLINICAL DATA:  Pt states fell 3 days ago injuring left arm. Pt complains of wrist and forearm pain. Pt appears in  no acute distress. best images attainable, pt shakey and unsteady in lateral position EXAM: LEFT FOREARM - 2 VIEW; LEFT WRIST - COMPLETE 3+ VIEW; LEFT HAND - COMPLETE 3+ VIEW COMPARISON:  None. FINDINGS: Right hand: No fracture or dislocation in the hand. No focal bony lesion. Minimal degenerative change. Soft tissues are unremarkable. Right wrist: There is a small displaced bony fragment at the base of the fourth metatarsal, lateral aspect. There is possible intra-articular involvement. No evidence of dislocation. No additional fractures are identified in the wrist. Right forearm: No evidence of fracture or dislocation in the tibia or fibula. Regional soft tissues are unremarkable. IMPRESSION: 1. Small displaced fracture fragment at the base of the fourth metatarsal of the left hand, lateral aspect. Possible intra-articular involvement. 2. No additional acute osseous abnormality in the hand, wrist or forearm. Electronically Signed   By: Audie Pinto M.D.   On: 08/26/2019 20:42    ____________________________________________    PROCEDURES  Procedure(s) performed:    .Splint Application  Date/Time: 08/26/2019 9:33 PM Performed by: Darletta Moll, PA-C Authorized by: Darletta Moll, PA-C   Consent:    Consent obtained:  Verbal   Consent given by:  Patient    Risks discussed:  Pain and swelling Pre-procedure details:    Sensation:  Normal Procedure details:    Laterality:  Left   Location:  Hand   Hand:  L hand   Splint type:  Volar short arm   Supplies:  Cotton padding, Ortho-Glass and elastic bandage Post-procedure details:    Pain:  Unchanged   Sensation:  Normal   Patient tolerance of procedure:  Tolerated well, no immediate complications      Medications - No data to display   ____________________________________________   INITIAL IMPRESSION / ASSESSMENT AND PLAN / ED COURSE  Pertinent labs & imaging results that were available during my care of the patient were reviewed by me and considered in my medical decision making (see chart for details).  Review of the [place] CSRS was performed in accordance of the [place] prior to dispensing any controlled drugs.           Patient's diagnosis is consistent with metacarpal fracture.  Patient presented to the emergency department complaining of left upper extremity pain from the hand to the elbow.  Patient has been evaluated multiple times for similar injuries over the past several weeks.  Initially patient states that he fell 3 days ago, then he reports that he fell 3 weeks ago.  Unsure exact timing of patient's injury.  Patient does have a small minimally displaced metacarpal fracture to the fourth metacarpal.  No other significant findings on exam or imaging.  Patient will be placed in splint and referred to hand surgery.  He may have Tylenol and/or Motrin at home for pain.  I would not prescribe the patient any narcotics for this complaint as he has a long history of alcohol abuse and has had 18 narcotic prescriptions over the past year..  No further work-up at this time.  Patient is likely intoxicated from alcohol, but patient is alert, oriented x4 and appears competent to make his own decisions.  Patient is given ED precautions to return to the ED for any worsening or new  symptoms.     ____________________________________________  FINAL CLINICAL IMPRESSION(S) / ED DIAGNOSES  Final diagnoses:  Closed displaced fracture of base of fourth metacarpal bone of left hand, initial encounter      NEW MEDICATIONS STARTED DURING THIS VISIT:  ED Discharge Orders    None          This chart was dictated using voice recognition software/Dragon. Despite best efforts to proofread, errors can occur which can change the meaning. Any change was purely unintentional.    Darletta Moll, PA-C 08/26/19 2133    Arta Silence, MD 08/26/19 2240

## 2019-08-26 NOTE — ED Triage Notes (Signed)
Pt states fell 3 days ago injuring left arm. Pt complains of wrist and forearm pain. Pt appears in no acute distress.

## 2019-08-26 NOTE — ED Notes (Signed)
Patient to waiting room via wheelchair by EMS.  Per EMS patient reporting chronic left arm pain with nausea.  EMS vital signs:  HR - 92 (sinus rhythm), BP - 132/87, pulse oxi 98% on room air.

## 2019-09-13 ENCOUNTER — Ambulatory Visit: Payer: Medicaid Other | Admitting: Gerontology

## 2019-09-14 ENCOUNTER — Telehealth: Payer: Self-pay | Admitting: Pharmacist

## 2019-09-14 NOTE — Telephone Encounter (Signed)
09/14/2019 2:52:47 PM - Advair 100/50 refill online with [facility]  09/14/2019 Placed refill for Advair 100/50 online with [facility], to ship 10/27/2019, order# [id number].Delos Haring

## 2019-09-23 ENCOUNTER — Other Ambulatory Visit: Payer: Self-pay

## 2019-09-23 ENCOUNTER — Encounter: Payer: Self-pay | Admitting: Emergency Medicine

## 2019-09-23 ENCOUNTER — Emergency Department
Admission: EM | Admit: 2019-09-23 | Discharge: 2019-09-23 | Disposition: A | Discharge status: Home / Self Care | Payer: Medicaid Other | Attending: Emergency Medicine | Admitting: Emergency Medicine

## 2019-09-23 DIAGNOSIS — I10 Essential (primary) hypertension: Secondary | ICD-10-CM | POA: Insufficient documentation

## 2019-09-23 DIAGNOSIS — J45909 Unspecified asthma, uncomplicated: Secondary | ICD-10-CM | POA: Insufficient documentation

## 2019-09-23 DIAGNOSIS — G4700 Insomnia, unspecified: Secondary | ICD-10-CM | POA: Insufficient documentation

## 2019-09-23 DIAGNOSIS — Z87891 Personal history of nicotine dependence: Secondary | ICD-10-CM | POA: Insufficient documentation

## 2019-09-23 DIAGNOSIS — M25532 Pain in left wrist: Secondary | ICD-10-CM | POA: Insufficient documentation

## 2019-09-23 MED ORDER — MELOXICAM 15 MG PO TABS: 15.0000 mg | ORAL_TABLET | Freq: Every day | ORAL | 0 refills | Status: AC

## 2019-09-23 MED ORDER — CYCLOBENZAPRINE HCL 5 MG PO TABS: 5.0000 mg | ORAL_TABLET | Freq: Every day | ORAL | 0 refills | Status: DC

## 2019-09-23 MED ORDER — MELOXICAM 7.5 MG PO TABS
15.0000 mg | ORAL_TABLET | Freq: Once | ORAL | Status: AC
  Administered 2019-09-23: 15 mg via ORAL
  Filled 2019-09-23: qty 2

## 2019-09-23 NOTE — ED Notes (Signed)
Pt wanted medicine for hand and to go home- will notify EDP

## 2019-09-23 NOTE — ED Triage Notes (Signed)
States L hand pain. States injured 2 months ago and continues painful. States also insomnia which he has had for undetermined amount of time. States has been drinking ETOH.

## 2019-09-23 NOTE — ED Notes (Signed)
Pt given phone to call wife.

## 2019-09-23 NOTE — Discharge Instructions (Signed)
You should follow-up with your primary provider for ongoing symptoms. Return to the ED as needed. Discontinue any OTC ibuprofen, Motrin, or Aleve.

## 2019-09-25 NOTE — ED Provider Notes (Signed)
[facility] Emergency Department Provider Note ____________________________________________  Time seen: 2045  I have reviewed the triage vital signs and the nursing notes.  HISTORY  Chief Complaint  Hand Pain and Insomnia  HPI Miguel [name] is a 58 y.o. male presents with self to the ED for evaluation of ongoing intermittent hand and wrist pain on the left.  Patient with a history of a wrist fracture dislocation treated about a month and a half prior with immobilization.  Patient is a complaint of intermittent muscle and wrist pain but denies any distal paresthesias, swelling, edema, or recurrent injury.  He also complains of some insomnia that he describes as a chronic condition.  Patient is at this time without any other complaints including chest pain, shortness of breath, or fevers.   Patient is known to the ED with a history of alcohol abuse as well as hypertension and gout.  Past Medical History:  Diagnosis Date  . Alcohol abuse   . Asthma   . GERD (gastroesophageal reflux disease)   . Gout   . Hypertension   . Kidney stone   . OCD (obsessive compulsive disorder)   . Renal colic     Patient Active Problem List   Diagnosis Date Noted  . MDD (major depressive disorder), recurrent severe, without psychosis ([place]) 07/27/2019  . Social anxiety disorder 07/27/2019  . Left-sided chest wall pain 06/14/2019  . Alcoholic cirrhosis of liver without ascites ([place]) 06/14/2019  . Alcohol abuse 06/14/2019  . AKI (acute kidney injury) ([place]) 05/27/2019  . Alcohol withdrawal ([place]) 05/07/2019  . Alcoholic intoxication with complication ([place])   . Sepsis ([place]) 03/08/2019  . Breast lump or mass 10/04/2018  . Sepsis secondary to UTI ([place]) 09/14/2018  . Asthma 07/07/2018  . GERD (gastroesophageal reflux disease) 07/07/2018  . OCD (obsessive compulsive disorder) 07/07/2018  . Self-inflicted laceration of wrist ([place]) 03/10/2018  . Leg hematoma 12/25/2017  . Suicide and  self-inflicted injury by cutting and piercing instrument ([place]) 03/10/2017  . 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 Substance induced mood disorder ([place]) 08/15/2016  . Involuntary commitment 08/15/2016  . Alcohol use disorder, severe, dependence ([place]) 02/05/2016  . Hypertension 12/05/2015  . Tachycardia 12/05/2015  . Gout 11/13/2015  . Chronic back pain 05/02/2015    Past Surgical History:  Procedure Laterality Date  . CHOLECYSTECTOMY  2012  . EXTRACORPOREAL SHOCK WAVE LITHOTRIPSY Left 01/12/2019   Procedure: EXTRACORPOREAL SHOCK WAVE LITHOTRIPSY (ESWL);  Surgeon: Billey Co, MD;  Location: [facility] ORS;  Service: Urology;  Laterality: Left;    Prior to Admission medications   Medication Sig Start Date End Date Taking? Authorizing Provider  allopurinol (ZYLOPRIM) 300 MG tablet Take 1 tablet (300 mg total) by mouth daily. 08/01/19   Clapacs, Madie Reno, MD  ascorbic acid (VITAMIN C) 500 MG tablet Take 1 tablet (500 mg total) by mouth daily. 08/01/19   Clapacs, Madie Reno, MD  citalopram (CELEXA) 20 MG tablet Take 1 tablet (20 mg total) by mouth daily. 08/01/19   Clapacs, Madie Reno, MD  cloNIDine (CATAPRES) 0.1 MG tablet Take 1 tablet (0.1 mg total) by mouth daily. 08/24/19   Iloabachie, Chioma E, NP  colchicine 0.6 MG tablet Take 1 tablet (0.6 mg total) by mouth daily as needed. 08/01/19   Clapacs, Madie Reno, MD  cyclobenzaprine (FLEXERIL) 5 MG tablet Take 1 tablet (5 mg total) by mouth at bedtime. 09/23/19   Geron Mulford, Dannielle Karvonen, PA-C  Fluticasone-Salmeterol (ADVAIR DISKUS)  100-50 MCG/DOSE AEPB Inhale 1 puff into the lungs 2 (two) times daily. 09/01/18   Tukov-Yual, Arlyss Gandy, NP  folic acid (FOLVITE) 1 MG tablet Take 1 tablet (1 mg total) by mouth daily. 08/01/19   Clapacs, Madie Reno, MD  gabapentin (NEURONTIN) 100 MG capsule Take 2 capsules (200 mg total) by mouth 3 (three) times daily. 08/01/19   Clapacs, Madie Reno, MD  hydrOXYzine (ATARAX/VISTARIL) 25 MG tablet  Take 1 tablet (25 mg total) by mouth 3 (three) times daily as needed for anxiety. 08/01/19   Clapacs, Madie Reno, MD  lisinopril (ZESTRIL) 40 MG tablet Take 1 tablet (40 mg total) by mouth daily. 08/01/19   Clapacs, Madie Reno, MD  meloxicam (MOBIC) 15 MG tablet Take 1 tablet (15 mg total) by mouth daily. 09/23/19 10/23/19  Burnadette Baskett, Dannielle Karvonen, PA-C  Multiple Vitamins-Iron (MULTIVITAMINS WITH IRON) TABS tablet Take 1 tablet by mouth daily. 08/01/19   Clapacs, Madie Reno, MD  thiamine 250 MG tablet Take 1 tablet (250 mg total) by mouth daily. 08/01/19   Clapacs, Madie Reno, MD  traZODone (DESYREL) 50 MG tablet Take 1 tablet (50 mg total) by mouth at bedtime as needed for sleep. 08/01/19   Clapacs, Madie Reno, MD    Allergies Percocet [oxycodone-acetaminophen]  Family History  Problem Relation Age of Onset  . Alcohol abuse Father   . Breast cancer Mother 77    Social History Social History   Tobacco Use  . Smoking status: Former Research scientist (life sciences)  . Smokeless tobacco: Never Used  . Tobacco comment: quit 30 years ago  Substance Use Topics  . Alcohol use: Yes    Alcohol/week: 2.0 standard drinks    Types: 2 Glasses of wine per week    Comment: daily  . Drug use: No    Review of Systems  Constitutional: Negative for fever. Eyes: Negative for visual changes. ENT: Negative for sore throat. Cardiovascular: Negative for chest pain. Respiratory: Negative for shortness of breath. Gastrointestinal: Negative for abdominal pain, vomiting and diarrhea. Genitourinary: Negative for dysuria. Musculoskeletal: Negative for back pain.  Left wrist pain as above. Skin: Negative for rash. Neurological: Negative for headaches, focal weakness or numbness. ____________________________________________  PHYSICAL EXAM:  VITAL SIGNS: ED Triage Vitals  Enc Vitals Group     BP 09/23/19 1841 (!) 176/105     Pulse Rate 09/23/19 1841 89     Resp 09/23/19 1841 20     Temp 09/23/19 1841 97.7 F (36.5 C)     Temp Source 09/23/19  1841 Oral     SpO2 09/23/19 1841 99 %     Weight 09/23/19 1842 190 lb (86.2 kg)     Height 09/23/19 1842 6' (1.829 m)     Head Circumference --      Peak Flow --      Pain Score 09/23/19 1842 10     Pain Loc --      Pain Edu? --      Excl. in [place]? --     Constitutional: Alert and oriented. Well appearing and in no distress. Head: Normocephalic and atraumatic. Eyes: Conjunctivae are normal. Normal extraocular movements Cardiovascular: Normal rate, regular rhythm. Normal distal pulses. Respiratory: Normal respiratory effort. Musculoskeletal: Left wrist without any obvious deformity, dislocation, or joint effusion.  Patient with normal composite fist bilaterally.  Normal flexion and extension range of the wrist is appreciated.  Nontender with normal range of motion in all extremities.  Neurologic:  Normal gait without ataxia. Normal speech and language.  No gross focal neurologic deficits are appreciated. Skin:  Skin is warm, dry and intact. No rash noted. Psychiatric: Mood and affect are normal. Patient exhibits appropriate insight and judgment. ____________________________________________  PROCEDURES  Meloxicam 15 mg p.o. Procedures ____________________________________________  INITIAL IMPRESSION / ASSESSMENT AND PLAN / ED COURSE  Patient with ED evaluation of ongoing wrist pain given a remote history of right wrist fracture.  Exam is overall benign reassuring at this time.  Symptoms likely represent a muscle strain to the wrist at this time.  Patient is advised to use an Ace bandage to the wrist for support, he may alternatively use the previously prescribed wrist cock-up splint.  In regards to his request for treatment of his insomnia.  Patient is referred to his primary provider for ongoing management.  He is given a prescription for an anti-inflammatory as well as a muscle accent which should also help induce some drowsiness.  He will take the medication primarily at night.  He  advised he understands discharge directions and will follow up as directed.  Miguel [name] was evaluated in Emergency Department on 09/25/2019 for the symptoms described in the history of present illness. He was evaluated in the context of the global COVID-19 pandemic, which necessitated consideration that the patient might be at risk for infection with the SARS-CoV-2 virus that causes COVID-19. Institutional protocols and algorithms that pertain to the evaluation of patients at risk for COVID-19 are in a state of rapid change based on information released by regulatory bodies including the CDC and federal and state organizations. These policies and algorithms were followed during the patient's care in the ED. ____________________________________________  FINAL CLINICAL IMPRESSION(S) / ED DIAGNOSES  Final diagnoses:  Left wrist pain  Insomnia, unspecified type      Carmie End Dannielle Karvonen, PA-C 09/25/19 0008    Nance Pear, MD 10/02/19 1504

## 2019-10-10 IMAGING — DX DG ABDOMEN 1V
2 series · 2 of 2 positions shown · non-contrast
Comparison: CT abdomen and pelvis 01/09/2019

CLINICAL DATA: Left-sided stone

EXAM:
ABDOMEN - 1 VIEW

[abdomen kub (1 of 2)]
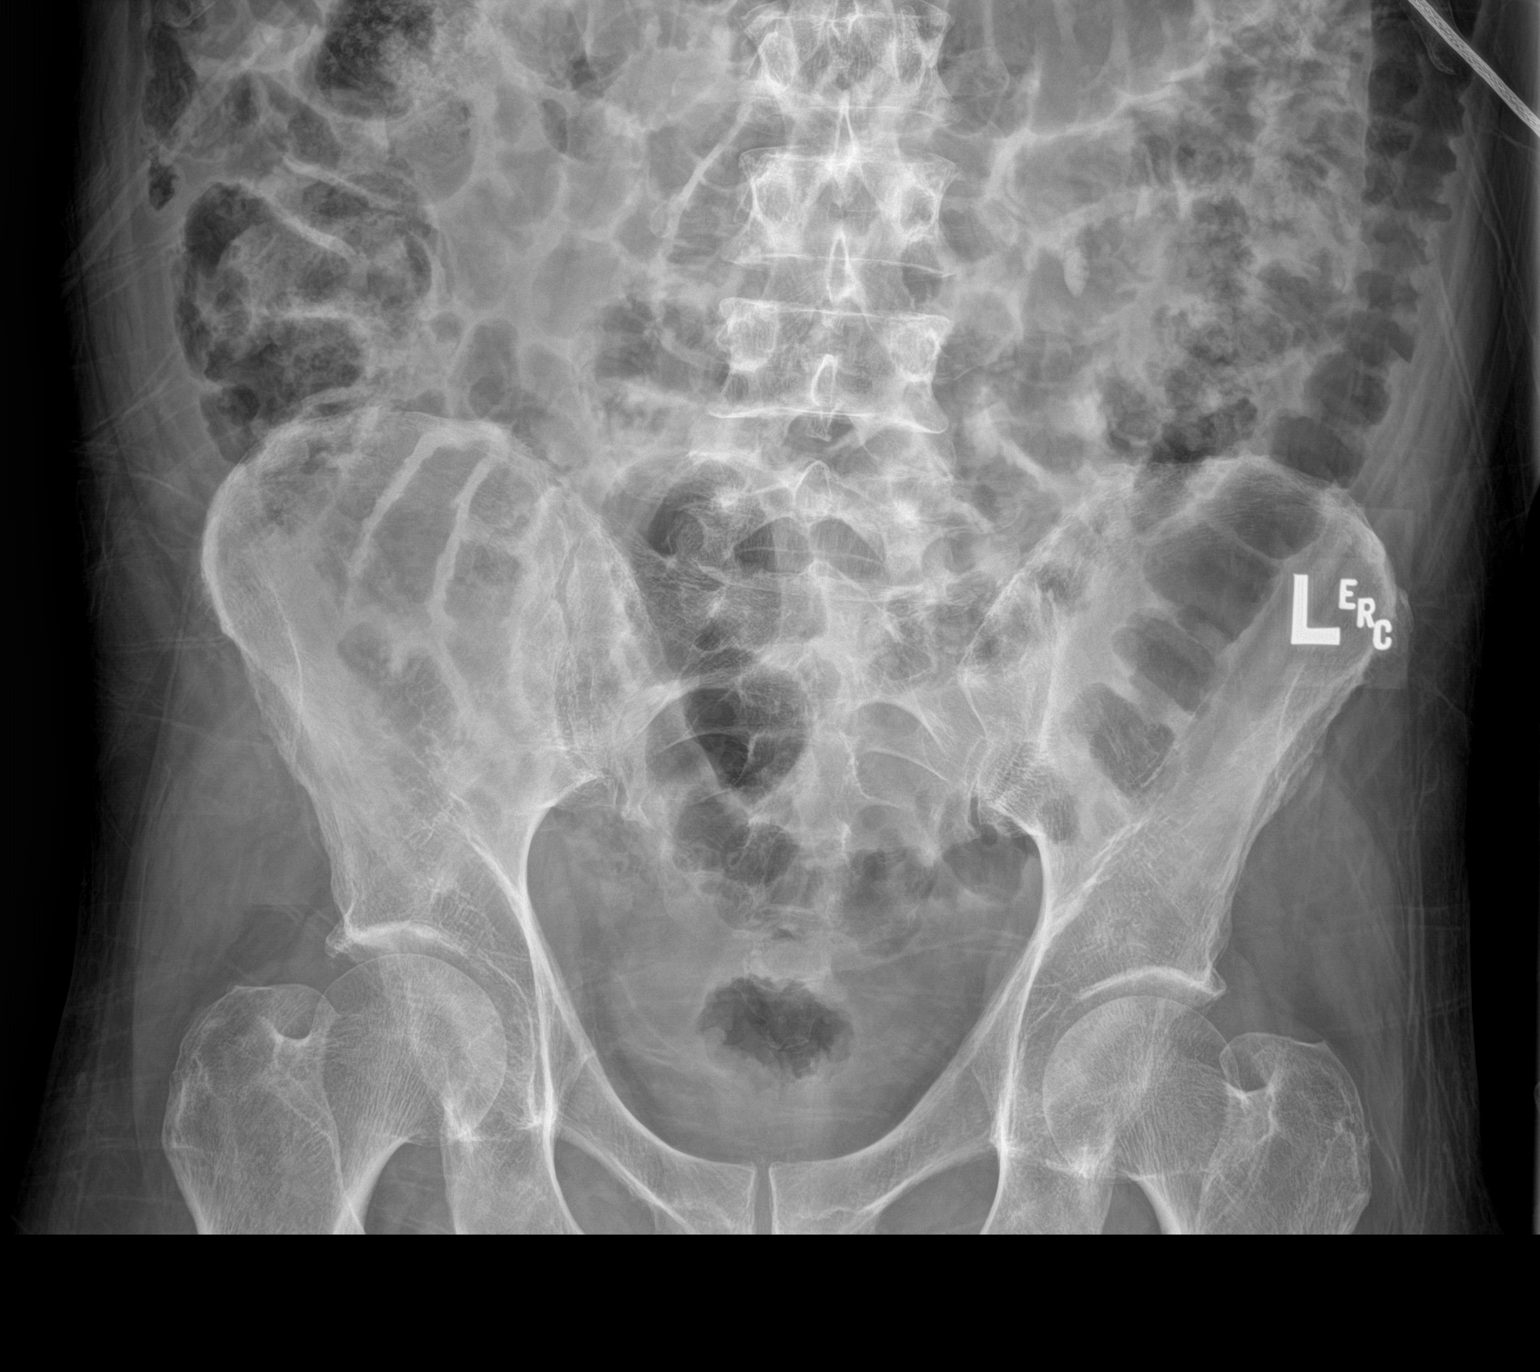

[abdomen kub (2 of 2)]
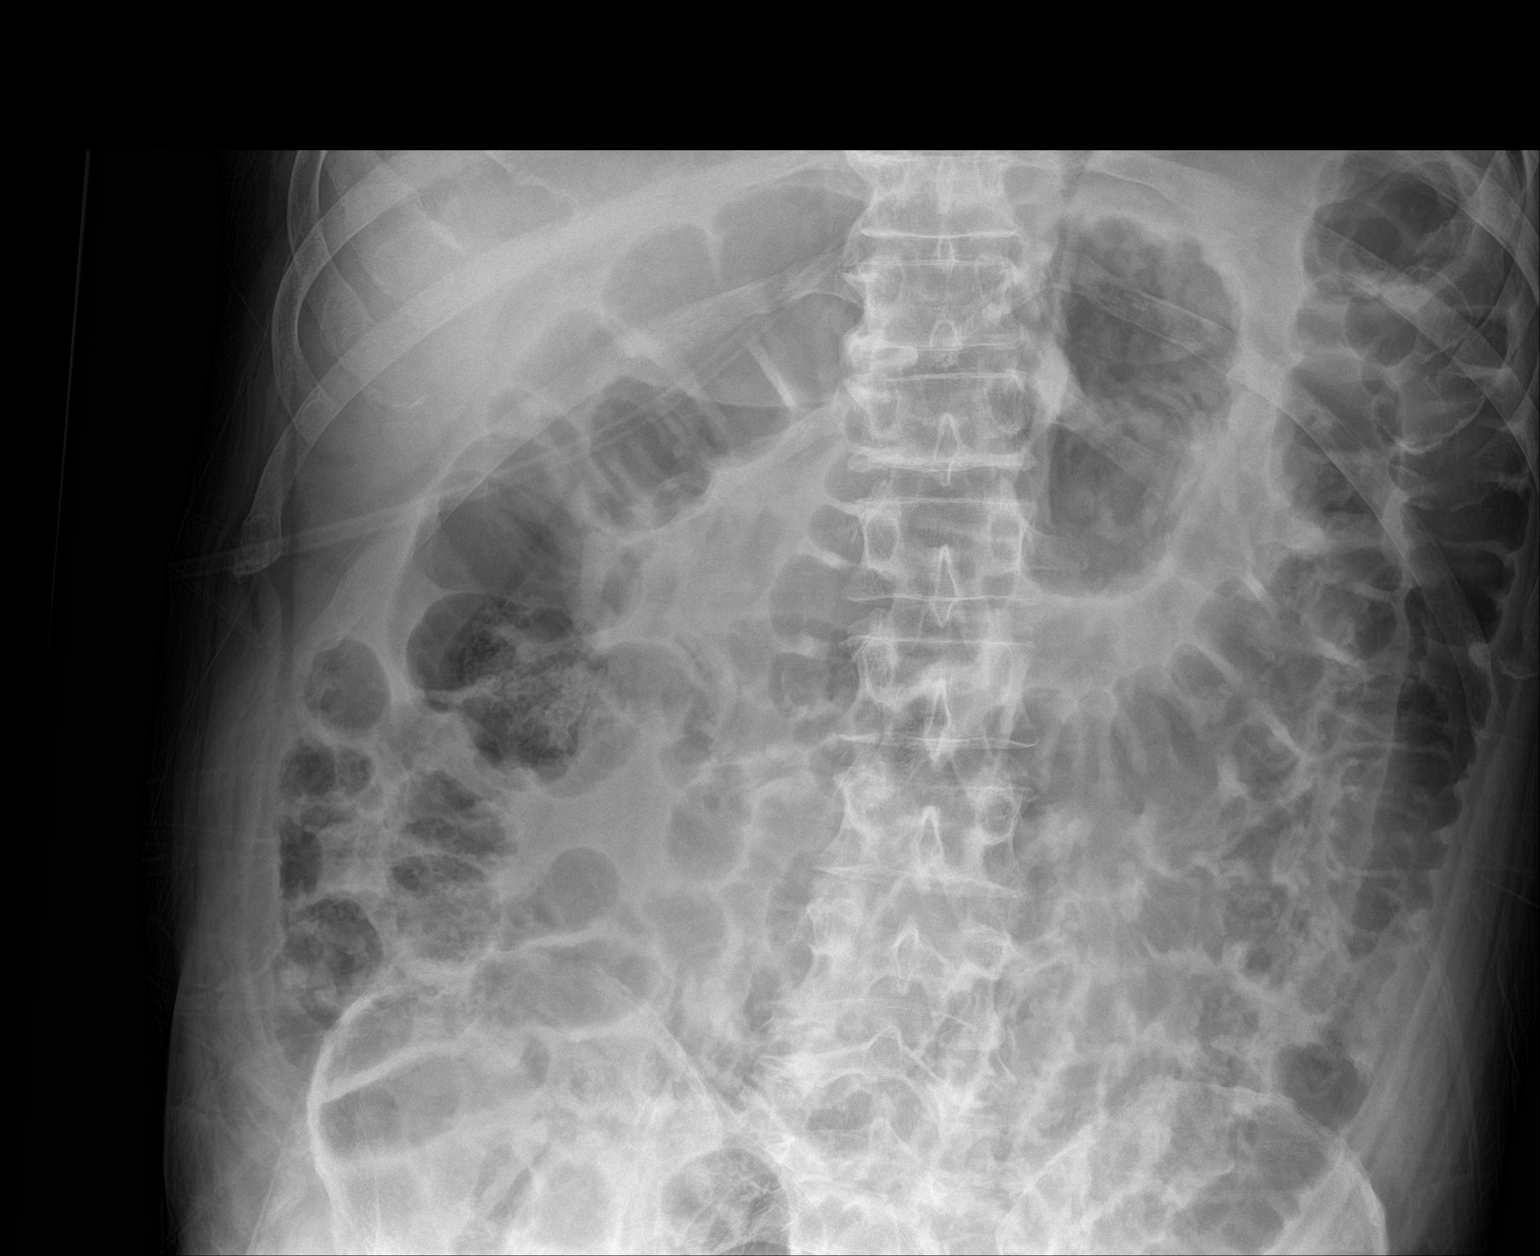

[2 of 2 positions shown; findings below may reference images not displayed]

FINDINGS: Visualization of the abdominal contents is limited due to bowel gas
overlying. An ovoid stone is demonstrated in the mid left abdomen at
the level of L3-4 corresponding to stones seen at CT. On radiograph,
the stone measures 5 x 10 mm in diameter. No other radiopaque stones
are seen. Degenerative changes in the spine and hips.
IMPRESSION: 5 x 10 mm stone projected over the mid left abdomen, corresponding
to ureteral stone seen at previous CT.

## 2019-10-10 IMAGING — CT CT RENAL STONE PROTOCOL
2 of 4 series · 16 of 46 positions shown, 18 images · non-contrast
Comparison: CT of the abdomen pelvis dated 11/20/2018

CLINICAL DATA: 57-year-old male with left flank pain. Concern for
kidney stone.

EXAM:
CT ABDOMEN AND PELVIS WITHOUT CONTRAST
TECHNIQUE: Multidetector CT imaging of the abdomen and pelvis was performed
following the standard protocol without IV contrast.

[Series 2: stone full standard · axial · 0.74mm/px · z∈[-613,-193]mm · 13 of 93 slices shown, 15 images]
[im 5/93  soft-tissue]
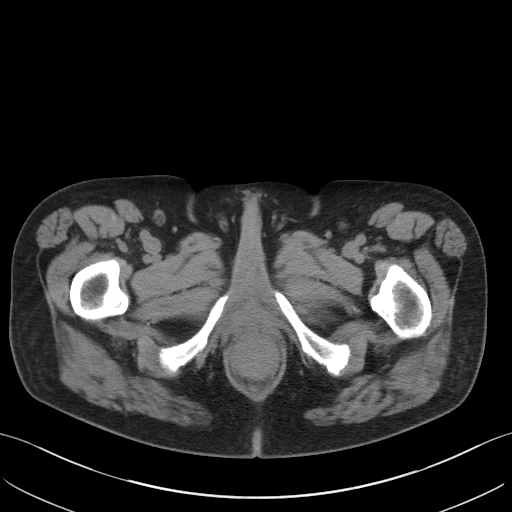
[im 5/93  bone]
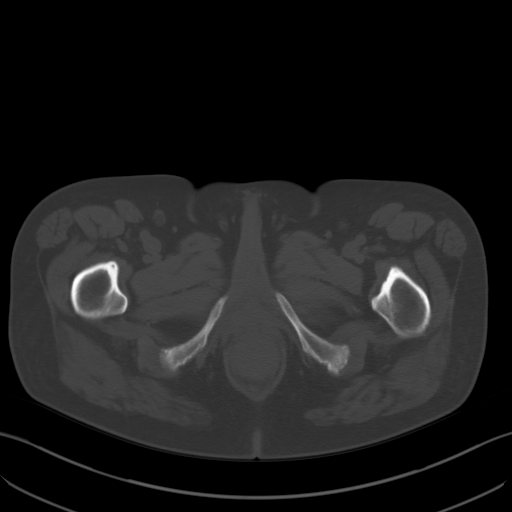
[im 13/93  soft-tissue]
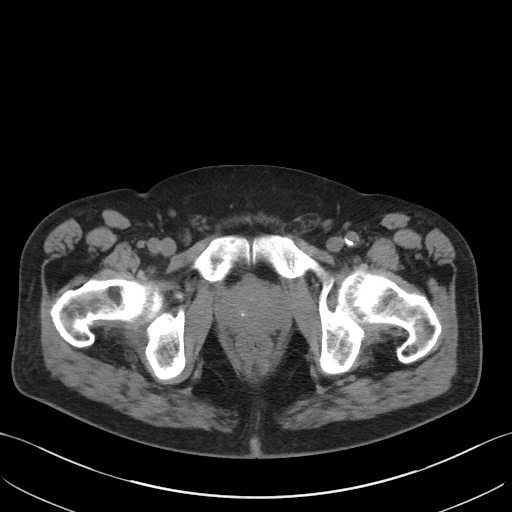
[im 21/93  soft-tissue]
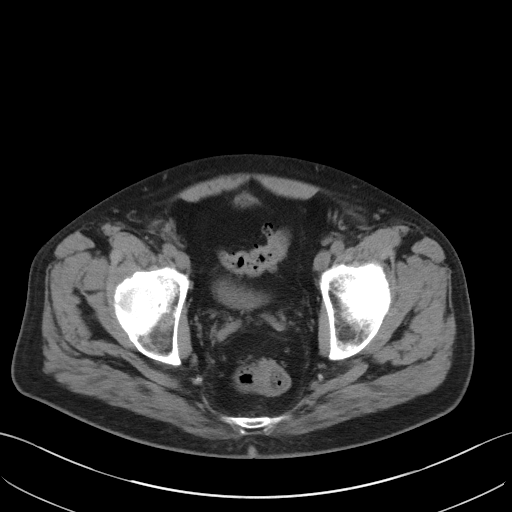
[im 25/93  soft-tissue]
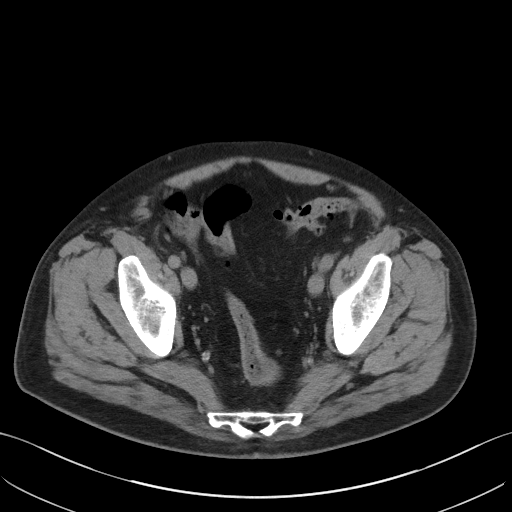
[im 33/93  soft-tissue]
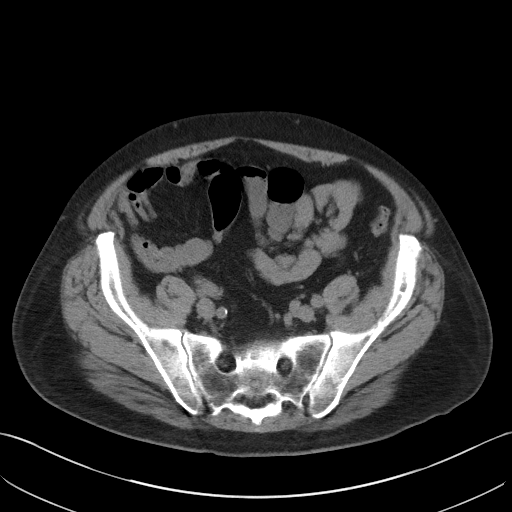
[im 41/93  soft-tissue]
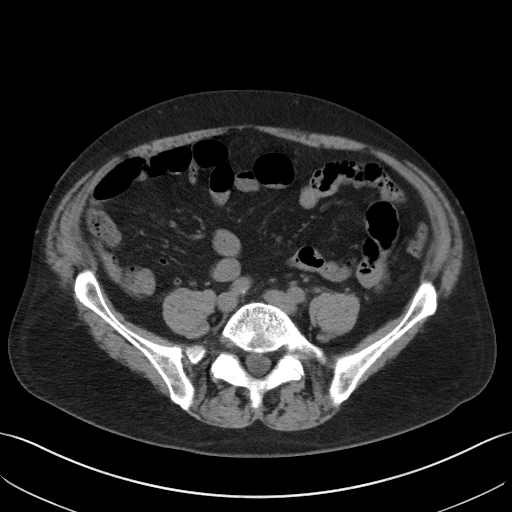
[im 49/93  soft-tissue]
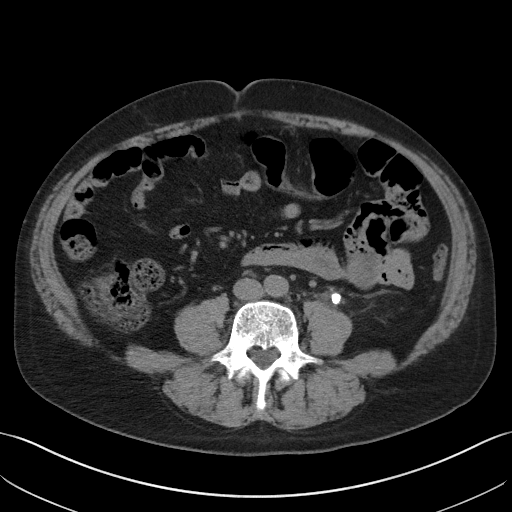
[im 53/93  soft-tissue]
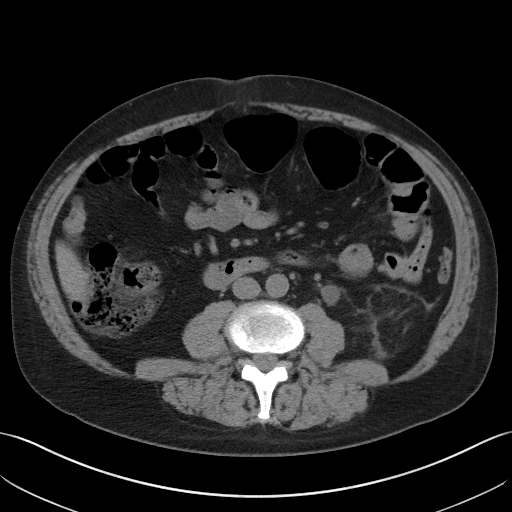
[im 61/93  soft-tissue]
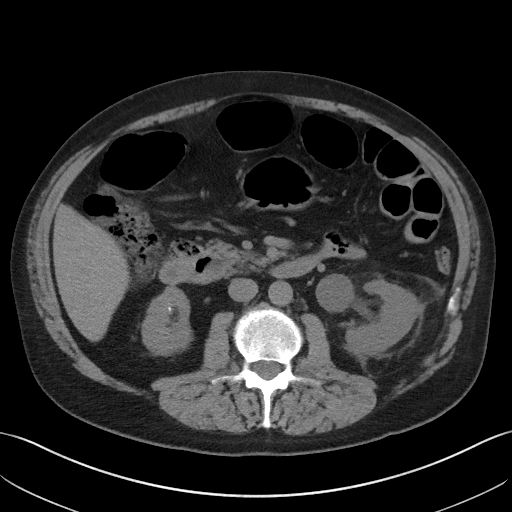
[im 61/93  bone]
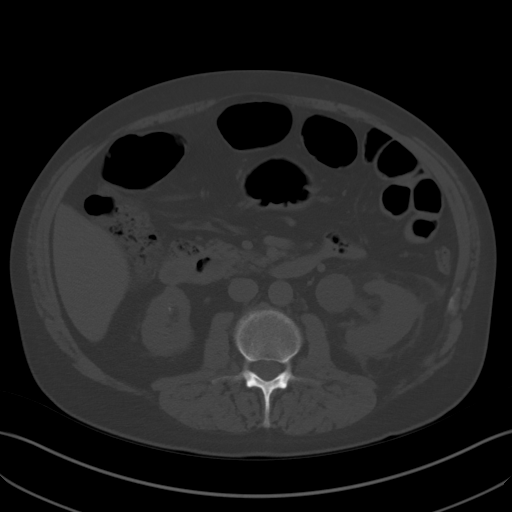
[im 69/93  soft-tissue]
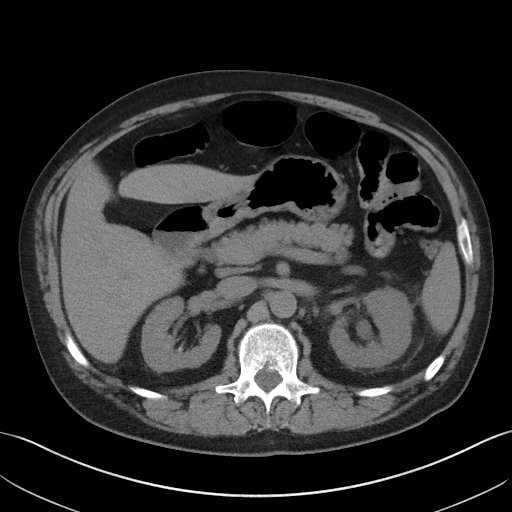
[im 73/93  soft-tissue]
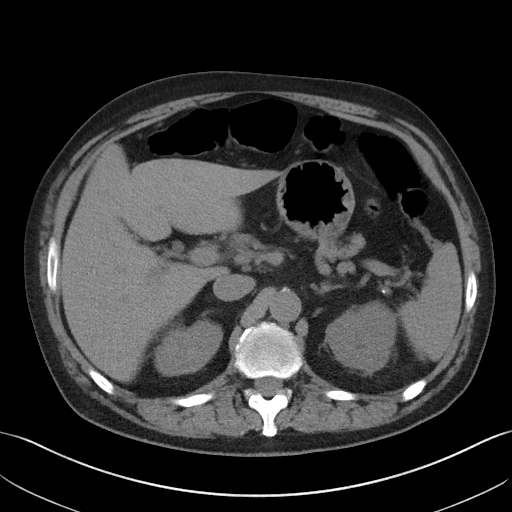
[im 81/93  soft-tissue]
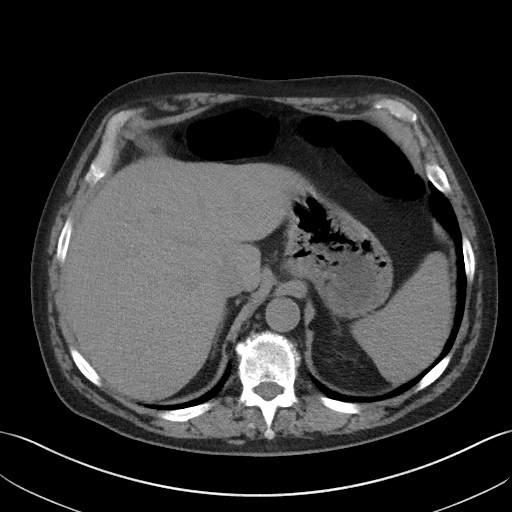
[im 89/93  soft-tissue]
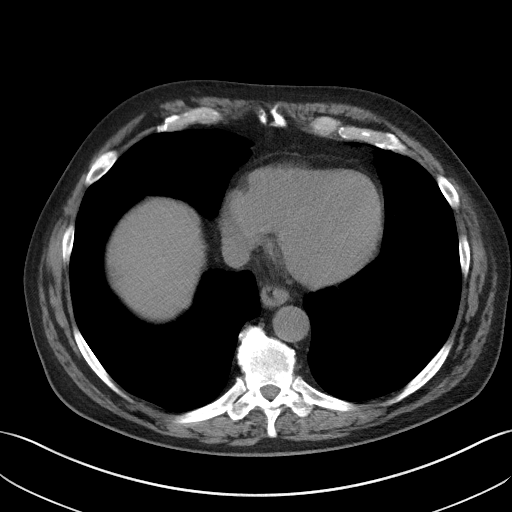

[Series 5: coronal · coronal · 0.71mm/px · 3 of 138 slices shown]
[im 46/138  soft-tissue]
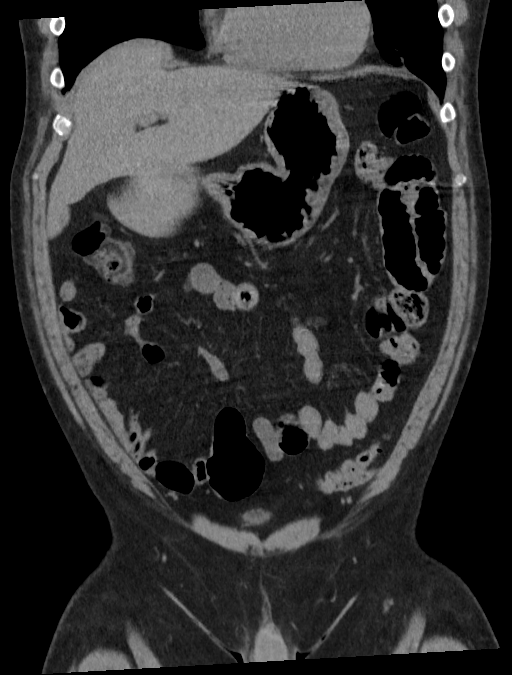
[im 61/138  soft-tissue]
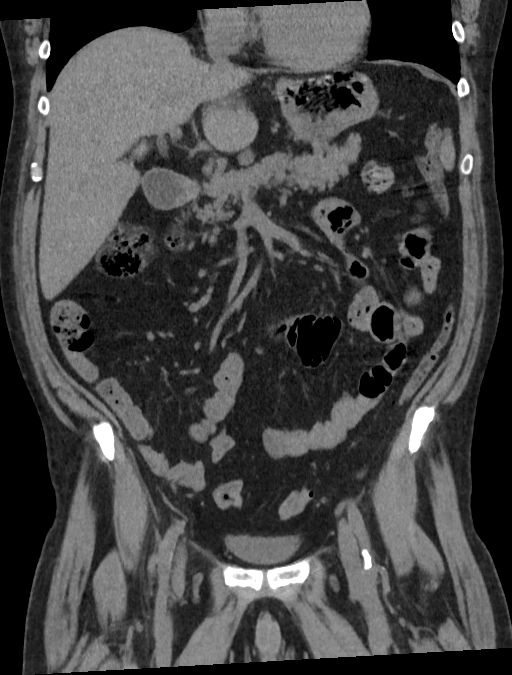
[im 77/138  soft-tissue]
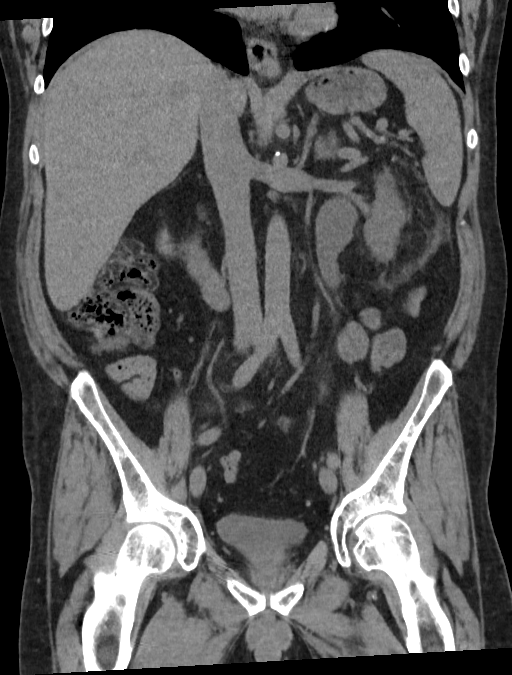

[16 of 46 positions shown; findings below may reference images not displayed]

FINDINGS: Evaluation of this exam is limited in the absence of intravenous
contrast.

Lower chest: The visualized lung bases are clear.

No intra-abdominal free air or free fluid.

Hepatobiliary: The liver is unremarkable. No intrahepatic biliary
ductal dilatation. Cholecystectomy.

Pancreas: Unremarkable. No pancreatic ductal dilatation or
surrounding inflammatory changes.

Spleen: Normal in size without focal abnormality.

Adrenals/Urinary Tract: The adrenal glands are unremarkable. There
is an obstructing stone in the proximal left ureter measuring up to
16 mm in greatest length. There is mild left hydronephrosis.
Multiple additional nonobstructing bilateral renal calculi measure
up to 5 mm. There is no hydronephrosis on the right. The right
ureter appears unremarkable. The urinary bladder is collapsed.

Stomach/Bowel: There is sigmoid diverticulosis without active
inflammatory changes. Multiple duodenal diverticula measure up to
3.5 cm in the second portion of the duodenum. There is no bowel
obstruction. The appendix is normal.

Vascular/Lymphatic: Mild aortoiliac atherosclerotic disease. No
portal venous gas. There is no adenopathy.

Reproductive: Mildly enlarged prostate gland measuring 5 cm in
transverse diameter.

Other: None

Musculoskeletal: No acute or significant osseous findings.
IMPRESSION: 1. A 16 mm obstructing stone in the proximal left ureter with mild
left hydronephrosis.
2. Multiple nonobstructing bilateral renal calculi measuring up to 5
mm. No hydronephrosis on the right.
3. Colonic diverticulosis. No bowel obstruction or active
inflammation. Normal appendix.

## 2019-10-14 ENCOUNTER — Encounter: Payer: Self-pay | Admitting: Intensive Care

## 2019-10-14 ENCOUNTER — Other Ambulatory Visit: Payer: Self-pay

## 2019-10-14 ENCOUNTER — Emergency Department
Admission: EM | Admit: 2019-10-14 | Discharge: 2019-10-14 | Disposition: A | Discharge status: Home / Self Care | Payer: Medicaid Other | Attending: Student | Admitting: Student

## 2019-10-14 DIAGNOSIS — Z87891 Personal history of nicotine dependence: Secondary | ICD-10-CM | POA: Insufficient documentation

## 2019-10-14 DIAGNOSIS — F1020 Alcohol dependence, uncomplicated: Secondary | ICD-10-CM | POA: Insufficient documentation

## 2019-10-14 DIAGNOSIS — I10 Essential (primary) hypertension: Secondary | ICD-10-CM | POA: Insufficient documentation

## 2019-10-14 DIAGNOSIS — Z79899 Other long term (current) drug therapy: Secondary | ICD-10-CM | POA: Insufficient documentation

## 2019-10-14 DIAGNOSIS — F411 Generalized anxiety disorder: Secondary | ICD-10-CM | POA: Diagnosis not present

## 2019-10-14 DIAGNOSIS — J45909 Unspecified asthma, uncomplicated: Secondary | ICD-10-CM | POA: Insufficient documentation

## 2019-10-14 DIAGNOSIS — F419 Anxiety disorder, unspecified: Secondary | ICD-10-CM | POA: Insufficient documentation

## 2019-10-14 DIAGNOSIS — F10220 Alcohol dependence with intoxication, uncomplicated: Secondary | ICD-10-CM | POA: Diagnosis present

## 2019-10-14 HISTORY — DX: Anxiety disorder, unspecified: F41.9

## 2019-10-14 MED ORDER — GABAPENTIN 100 MG PO CAPS: 200.0000 mg | ORAL_CAPSULE | Freq: Three times a day (TID) | ORAL | Status: DC

## 2019-10-14 MED ORDER — HYDROXYZINE HCL 25 MG PO TABS: 25.0000 mg | ORAL_TABLET | Freq: Three times a day (TID) | ORAL | Status: DC | PRN

## 2019-10-14 MED ORDER — LORAZEPAM 1 MG PO TABS
1.0000 mg | ORAL_TABLET | Freq: Once | ORAL | Status: AC
  Administered 2019-10-14: 1 mg via ORAL
  Filled 2019-10-14: qty 1

## 2019-10-14 MED ORDER — QUETIAPINE FUMARATE 50 MG PO TABS: 50.0000 mg | ORAL_TABLET | Freq: Every day | ORAL | 1 refills | Status: DC

## 2019-10-14 MED ORDER — CITALOPRAM HYDROBROMIDE 20 MG PO TABS: 20.0000 mg | ORAL_TABLET | Freq: Every day | ORAL | Status: DC

## 2019-10-14 MED ORDER — QUETIAPINE FUMARATE 25 MG PO TABS: 50.0000 mg | ORAL_TABLET | Freq: Every day | ORAL | Status: DC

## 2019-10-14 NOTE — ED Triage Notes (Signed)
Patient c/o anxiety and shaking since last night. Reports being out of his anxiety medication but unsure the name of it. Denies HI/SI. Alcohol abuse. Takes a couple of shots a day

## 2019-10-14 NOTE — ED Notes (Signed)
Pt will get medications as prescriptions. No meds dispensed here.

## 2019-10-14 NOTE — Discharge Instructions (Signed)

## 2019-10-14 NOTE — ED Notes (Signed)
Pt states that he has not been sleeping at all and that his anxiety has increased. Pt states that he ran out of his anxiety medication. Pt denies any SI/HI. Pt states that there have been many stressors in his life recently that are making things worse and he made himself come in today because he felt like he was getting bad and wanted to make sure he was okay.

## 2019-10-14 NOTE — ED Notes (Signed)
Psychiatry at bedside.

## 2019-10-14 NOTE — ED Provider Notes (Signed)
[facility] Emergency Department Provider Note  ____________________________________________   First MD Initiated Contact with Patient 10/14/19 1725     (approximate)  I have reviewed the triage vital signs and the nursing notes.  History  Chief Complaint Anxiety and Shaking    HPI Miguel [name] is a 58 y.o. male with a history of HTN, alcohol abuse, depression, who presents emergency department for anxiety.  Patient states since yesterday afternoon he has felt increasingly anxious.  He had trouble sleeping because of this.  Patient states he does take medication for his anxiety, but he recently ran out, he is unsure as to the name of his anxiety medication.  Patient reports a lot of recent life stressors that are contributing to his anxiety, in particular his father died several months ago and he has a handicapped wife who requires essentially around the clock home health nursing.   He does report alcohol use, but denies daily use. He states he took a few shots yesterday in an attempt to calm his nerves.   He denies any SI, HI, or self harm behaviors.    Past Medical Hx Past Medical History:  Diagnosis Date  . Alcohol abuse   . Anxiety   . Asthma   . GERD (gastroesophageal reflux disease)   . Gout   . Hypertension   . Kidney stone   . OCD (obsessive compulsive disorder)   . Renal colic     Problem List Patient Active Problem List   Diagnosis Date Noted  . MDD (major depressive disorder), recurrent severe, without psychosis ([place]) 07/27/2019  . Social anxiety disorder 07/27/2019  . Left-sided chest wall pain 06/14/2019  . Alcoholic cirrhosis of liver without ascites ([place]) 06/14/2019  . Alcohol abuse 06/14/2019  . AKI (acute kidney injury) ([place]) 05/27/2019  . Alcohol withdrawal ([place]) 05/07/2019  . Alcoholic intoxication with complication ([place])   . Sepsis ([place]) 03/08/2019  . Breast lump or mass 10/04/2018  . Sepsis secondary to UTI ([place])  09/14/2018  . Asthma 07/07/2018  . GERD (gastroesophageal reflux disease) 07/07/2018  . OCD (obsessive compulsive disorder) 07/07/2018  . Self-inflicted laceration of wrist ([place]) 03/10/2018  . Leg hematoma 12/25/2017  . Suicide and self-inflicted injury by cutting and piercing instrument ([place]) 03/10/2017  . Severe recurrent major 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 ([place]) 03/09/2017  . Substance induced mood disorder ([place]) 08/15/2016  . Involuntary commitment 08/15/2016  . Alcohol use disorder, severe, dependence ([place]) 02/05/2016  . Hypertension 12/05/2015  . Tachycardia 12/05/2015  . Gout 11/13/2015  . Chronic back pain 05/02/2015    Past Surgical Hx Past Surgical History:  Procedure Laterality Date  . CHOLECYSTECTOMY  2012  . EXTRACORPOREAL SHOCK WAVE LITHOTRIPSY Left 01/12/2019   Procedure: EXTRACORPOREAL SHOCK WAVE LITHOTRIPSY (ESWL);  Surgeon: Billey Co, MD;  Location: [facility] ORS;  Service: Urology;  Laterality: Left;    Medications Prior to Admission medications   Medication Sig Start Date End Date Taking? Authorizing Provider  allopurinol (ZYLOPRIM) 300 MG tablet Take 1 tablet (300 mg total) by mouth daily. 08/01/19   Clapacs, Madie Reno, MD  ascorbic acid (VITAMIN C) 500 MG tablet Take 1 tablet (500 mg total) by mouth daily. 08/01/19   Clapacs, Madie Reno, MD  citalopram (CELEXA) 20 MG tablet Take 1 tablet (20 mg total) by mouth daily. 08/01/19   Clapacs, Madie Reno, MD  cloNIDine (CATAPRES) 0.1 MG tablet Take 1 tablet (0.1 mg total) by mouth daily. 08/24/19   Iloabachie, Chioma E, NP  colchicine 0.6 MG tablet Take 1 tablet (0.6 mg total) by mouth daily as needed. 08/01/19   Clapacs, Madie Reno, MD  cyclobenzaprine (FLEXERIL) 5 MG tablet Take 1 tablet (5 mg total) by mouth at bedtime. 09/23/19   Menshew, Dannielle Karvonen, PA-C  Fluticasone-Salmeterol (ADVAIR DISKUS) 100-50 MCG/DOSE AEPB Inhale 1 puff into the lungs 2 (two) times daily. 09/01/18   Tukov-Yual, Arlyss Gandy, NP  folic acid  (FOLVITE) 1 MG tablet Take 1 tablet (1 mg total) by mouth daily. 08/01/19   Clapacs, Madie Reno, MD  gabapentin (NEURONTIN) 100 MG capsule Take 2 capsules (200 mg total) by mouth 3 (three) times daily. 08/01/19   Clapacs, Madie Reno, MD  hydrOXYzine (ATARAX/VISTARIL) 25 MG tablet Take 1 tablet (25 mg total) by mouth 3 (three) times daily as needed for anxiety. 08/01/19   Clapacs, Madie Reno, MD  lisinopril (ZESTRIL) 40 MG tablet Take 1 tablet (40 mg total) by mouth daily. 08/01/19   Clapacs, Madie Reno, MD  meloxicam (MOBIC) 15 MG tablet Take 1 tablet (15 mg total) by mouth daily. 09/23/19 10/23/19  Menshew, Dannielle Karvonen, PA-C  Multiple Vitamins-Iron (MULTIVITAMINS WITH IRON) TABS tablet Take 1 tablet by mouth daily. 08/01/19   Clapacs, Madie Reno, MD  thiamine 250 MG tablet Take 1 tablet (250 mg total) by mouth daily. 08/01/19   Clapacs, Madie Reno, MD  traZODone (DESYREL) 50 MG tablet Take 1 tablet (50 mg total) by mouth at bedtime as needed for sleep. 08/01/19   Clapacs, Madie Reno, MD    Allergies Percocet [oxycodone-acetaminophen]  Family Hx Family History  Problem Relation Age of Onset  . Alcohol abuse Father   . Breast cancer Mother 42    Social Hx Social History   Tobacco Use  . Smoking status: Former Smoker    Types: Cigarettes  . Smokeless tobacco: Never Used  . Tobacco comment: quit 30 years ago  Substance Use Topics  . Alcohol use: Yes    Alcohol/week: 3.0 - 4.0 standard drinks    Types: 3 - 4 Shots of liquor per week    Comment: daily  . Drug use: No     Review of Systems  Constitutional: Negative for fever, chills. Eyes: Negative for visual changes. ENT: Negative for sore throat. Cardiovascular: Negative for chest pain. Respiratory: Negative for shortness of breath. Gastrointestinal: Negative for nausea, vomiting.  Genitourinary: Negative for dysuria. Musculoskeletal: Negative for leg swelling. Skin: Negative for rash. Neurological: Negative for for headaches.   Physical Exam  Vital  Signs: ED Triage Vitals [10/14/19 1704]  Enc Vitals Group     BP (!) 165/102     Pulse Rate (!) 103     Resp 18     Temp 97.9 F (36.6 C)     Temp Source Oral     SpO2 96 %     Weight 185 lb (83.9 kg)     Height 6' (1.829 m)     Head Circumference      Peak Flow      Pain Score 0     Pain Loc      Pain Edu?      Excl. in [place]?     Constitutional: Alert and oriented.  Head: Normocephalic. Atraumatic. Eyes: Conjunctivae clear. Sclera anicteric. Nose: No congestion. No rhinorrhea. Mouth/Throat: Mucous membranes are moist.  Neck: No stridor.   Cardiovascular: Normal rate. Extremities well perfused. Respiratory: Normal respiratory effort.   Gastrointestinal: Non-distended.  Musculoskeletal: No deformities. Neurologic:  Normal speech  and language. No gross focal neurologic deficits are appreciated.  Skin: Skin is warm, dry and intact.  Psychiatric: Tearful and somewhat anxious, denies SI or HI.  EKG  N/A    Radiology  N/A   Procedures  Procedure(s) performed (including critical care):  Procedures   Initial Impression / Assessment and Plan / ED Course  58 y.o. male who presents to the ED for anxiety as above.   Suspect anxiety 2/2 life stressors. No SI or HI. No significant hypertension, tachycardia, tremors, or diaphoresis on exam to suggest acute alcohol withdrawal.   Given dose of Ativan here for symptom control. Seen by psychiatry who has written him Rx for assistance with symptoms. Otherwise, he has no SI, HI, and does not represent a threat to himself or others, no indication for admission or IVC at this time. As such will discharge with Rx by psychiatry, and given information for outpatient follow up. Patient agreeable.     Final Clinical Impression(s) / ED Diagnosis  Final diagnoses:  Anxiety       Note:  This document was prepared using Dragon voice recognition software and may include unintentional dictation errors.   Lilia Pro., MD  10/15/19 [phone]

## 2019-10-14 NOTE — Consult Note (Signed)
[facility] ED Discharge  10/14/2019 6:17 PM Miguel [name]  MRN:  [id number] Principal Problem: Generalized anxiety disorder Discharge Diagnoses: Principal Problem:   Generalized anxiety disorder Active Problems:   Alcohol use disorder, severe, dependence (HCC)  Subjective: "I didn't sleep at all last night until 515 this morning."  Patient seen and evaluated in person by this provider.  He reports a lack of sleep last night with increase in anxiety.  Minimizes his alcohol use, reporting a couple shots a day.  Denies any withdrawal symptoms or other substance use.  Denies suicidal/homicidal ideations and psychosis.  Admitted to inpatient behavioral health at [facility] in September.  He reports he did not follow-up with outpatient care due to caring for his wife who is disabled.  Now however, he reports that they just received nursing care 7 days a week.  This has alleviated much stress and work for him.  He still does not like to leave her alone and becomes more anxious when he is away.  Does not warrant psychiatric admission, medications will be prescribed for sleep.  Total Time spent with patient: 1 hour  Past Psychiatric History: depression, anxiety, alcohol use d/o  Past Medical History:  Past Medical History:  Diagnosis Date  . Alcohol abuse   . Anxiety   . Asthma   . GERD (gastroesophageal reflux disease)   . Gout   . Hypertension   . Kidney stone   . OCD (obsessive compulsive disorder)   . Renal colic     Past Surgical History:  Procedure Laterality Date  . CHOLECYSTECTOMY  2012  . EXTRACORPOREAL SHOCK WAVE LITHOTRIPSY Left 01/12/2019   Procedure: EXTRACORPOREAL SHOCK WAVE LITHOTRIPSY (ESWL);  Surgeon: Billey Co, MD;  Location: [facility] ORS;  Service: Urology;  Laterality: Left;   Family History:  Family History  Problem Relation Age of Onset  . Alcohol abuse Father   . Breast cancer Mother 42   Family Psychiatric  History: see above Social History:  Social History    Substance and Sexual Activity  Alcohol Use Yes  . Alcohol/week: 3.0 - 4.0 standard drinks  . Types: 3 - 4 Shots of liquor per week   Comment: daily     Social History   Substance and Sexual Activity  Drug Use No    Social History   Socioeconomic History  . Marital status: Married    Spouse name: Not on file  . Number of children: Not on file  . Years of education: Not on file  . Highest education level: Not on file  Occupational History  . Not on file  Social Needs  . Financial resource strain: Very hard  . Food insecurity    Worry: Never true    Inability: Never true  . Transportation needs    Medical: No    Non-medical: No  Tobacco Use  . Smoking status: Former Smoker    Types: Cigarettes  . Smokeless tobacco: Never Used  . Tobacco comment: quit 30 years ago  Substance and Sexual Activity  . Alcohol use: Yes    Alcohol/week: 3.0 - 4.0 standard drinks    Types: 3 - 4 Shots of liquor per week    Comment: daily  . Drug use: No  . Sexual activity: Yes  Lifestyle  . Physical activity    Days per week: 2 days    Minutes per session: 10 min  . Stress: Very much  Relationships  . Social Herbalist on phone:  Once a week    Gets together: Not on file    Attends religious service: Never    Active member of club or organization: No    Attends meetings of clubs or organizations: Never    Relationship status: Married  Other Topics Concern  . Not on file  Social History Narrative   Lives at home with his wife and takes care of his wife. Independent at baseline      - Biggest strain is financial but doesn't think they could got get more help.      Patient expressed interest in possible financial assistance. Informed written consent obtained    Has this patient used any form of tobacco in the last 30 days? (Cigarettes, Smokeless Tobacco, Cigars, and/or Pipes) A prescription for an FDA-approved tobacco cessation medication was offered at discharge and the  patient refused  Current Medications: Current Facility-Administered Medications  Medication Dose Route Frequency Provider Last Rate Last Dose  . citalopram (CELEXA) tablet 20 mg  20 mg Oral Daily Alanna Storti Y, NP      . gabapentin (NEURONTIN) capsule 200 mg  200 mg Oral TID Patrecia Pour, NP      . hydrOXYzine (ATARAX/VISTARIL) tablet 25 mg  25 mg Oral TID PRN Patrecia Pour, NP      . QUEtiapine (SEROQUEL) tablet 50 mg  50 mg Oral QHS Patrecia Pour, NP       Current Outpatient Medications  Medication Sig Dispense Refill  . allopurinol (ZYLOPRIM) 300 MG tablet Take 1 tablet (300 mg total) by mouth daily. 30 tablet 1  . ascorbic acid (VITAMIN C) 500 MG tablet Take 1 tablet (500 mg total) by mouth daily. 30 tablet 1  . citalopram (CELEXA) 20 MG tablet Take 1 tablet (20 mg total) by mouth daily. 30 tablet 1  . cloNIDine (CATAPRES) 0.1 MG tablet Take 1 tablet (0.1 mg total) by mouth daily. 30 tablet 0  . colchicine 0.6 MG tablet Take 1 tablet (0.6 mg total) by mouth daily as needed. 30 tablet 1  . cyclobenzaprine (FLEXERIL) 5 MG tablet Take 1 tablet (5 mg total) by mouth at bedtime. 30 tablet 0  . Fluticasone-Salmeterol (ADVAIR DISKUS) 100-50 MCG/DOSE AEPB Inhale 1 puff into the lungs 2 (two) times daily. [id number] each 0  . folic acid (FOLVITE) 1 MG tablet Take 1 tablet (1 mg total) by mouth daily. 30 tablet 1  . gabapentin (NEURONTIN) 100 MG capsule Take 2 capsules (200 mg total) by mouth 3 (three) times daily. 90 capsule 1  . hydrOXYzine (ATARAX/VISTARIL) 25 MG tablet Take 1 tablet (25 mg total) by mouth 3 (three) times daily as needed for anxiety. 60 tablet 1  . lisinopril (ZESTRIL) 40 MG tablet Take 1 tablet (40 mg total) by mouth daily. 30 tablet 1  . meloxicam (MOBIC) 15 MG tablet Take 1 tablet (15 mg total) by mouth daily. 30 tablet 0  . Multiple Vitamins-Iron (MULTIVITAMINS WITH IRON) TABS tablet Take 1 tablet by mouth daily. 30 tablet 1  . QUEtiapine (SEROQUEL) 50 MG tablet Take 1  tablet (50 mg total) by mouth at bedtime. 60 tablet 1  . thiamine 250 MG tablet Take 1 tablet (250 mg total) by mouth daily. 30 tablet 1   PTA Medications: (Not in a hospital admission)   Musculoskeletal: Strength & Muscle Tone: within normal limits Gait & Station: normal Patient leans: N/A  Psychiatric Specialty Exam: Physical Exam  Nursing note and vitals reviewed. Constitutional: He is oriented to  person, place, and time. He appears well-developed and well-nourished.  HENT:  Head: Normocephalic.  Neck: Normal range of motion.  Respiratory: Effort normal.  Musculoskeletal: Normal range of motion.  Neurological: He is alert and oriented to person, place, and time.  Psychiatric: His speech is normal and behavior is normal. Judgment and thought content normal. His mood appears anxious. Cognition and memory are normal.    Review of Systems  Psychiatric/Behavioral: The patient is nervous/anxious and has insomnia.   All other systems reviewed and are negative.   Blood pressure (!) 165/102, pulse (!) 103, temperature 97.9 F (36.6 C), temperature source Oral, resp. rate 18, height 6' (1.829 m), weight 83.9 kg, SpO2 96 %.Body mass index is 25.09 kg/m.  General Appearance: Casual  Eye Contact:  Good  Speech:  Normal Rate  Volume:  Normal  Mood:  Anxious  Affect:  Congruent  Thought Process:  Coherent and Descriptions of Associations: Intact  Orientation:  Full (Time, Place, and Person)  Thought Content:  WDL and Logical  Suicidal Thoughts:  No  Homicidal Thoughts:  No  Memory:  Immediate;   Good Recent;   Good Remote;   Good  Judgement:  Fair  Insight:  Fair  Psychomotor Activity:  Normal  Concentration:  Concentration: Fair and Attention Span: Fair  Recall:  Good  Fund of Knowledge:  Good  Language:  Good  Akathisia:  No  Handed:  Right  AIMS (if indicated):     Assets:  Housing Leisure Time Physical Health Resilience Social Support  ADL's:  Intact  Cognition:   WNL  Sleep:        Demographic Factors:  Male and Caucasian  Loss Factors: NA  Historical Factors: NA  Risk Reduction Factors:   Sense of responsibility to family, Living with another person, especially a relative and Positive social support  Continued Clinical Symptoms:  Anxiety, mild to moderate  Cognitive Features That Contribute To Risk:  None    Suicide Risk:  Minimal: No identifiable suicidal ideation.  Patients presenting with no risk factors but with morbid ruminations; may be classified as minimal risk based on the severity of the depressive symptoms   Plan Of Care/Follow-up recommendations:  Anxiety and depression: -Continue Celexa 20 mg daily -Continue Vistaril 25 mg TID PRN  Alcohol use d/o: -Continue gabapentin 200 mg TID  Insomnia: -Start Seroquel 50 mg at bedtime, repeat once in an hour if not effective Activity:  as tolerated Diet:  heart healthy diet  Disposition: discharge home Waylan Boga, NP 10/14/2019, 6:17 PM

## 2019-10-21 IMAGING — CT CT ABD-PELV W/ CM
2 of 5 series · 15 of 46 positions shown, 17 images · IV contrast (APPLIED)
Comparison: CT of the abdomen and pelvis 01/09/2019

CLINICAL DATA: Left lower quadrant abdominal pain.

EXAM:
CT ABDOMEN AND PELVIS WITH CONTRAST
TECHNIQUE: Multidetector CT imaging of the abdomen and pelvis was performed
using the standard protocol following bolus administration of
intravenous contrast.
CONTRAST:  100mL OMNIPAQUE IOHEXOL 300 MG/ML  SOLN

[Series 2: routine abd/pel with · axial · 0.79mm/px · z∈[-1174,-719]mm · 12 of 101 slices shown, 14 images]
[im 5/101  soft-tissue]
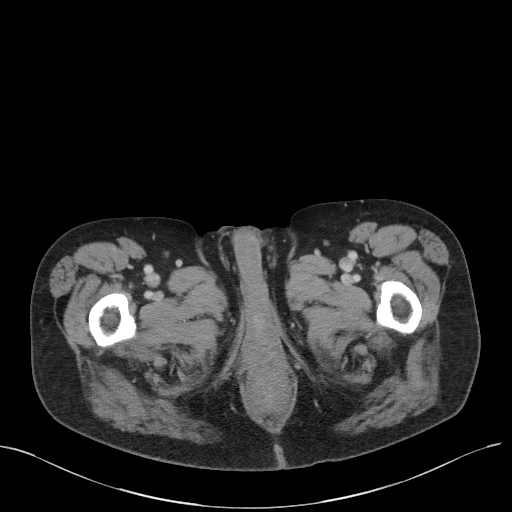
[im 5/101  bone]
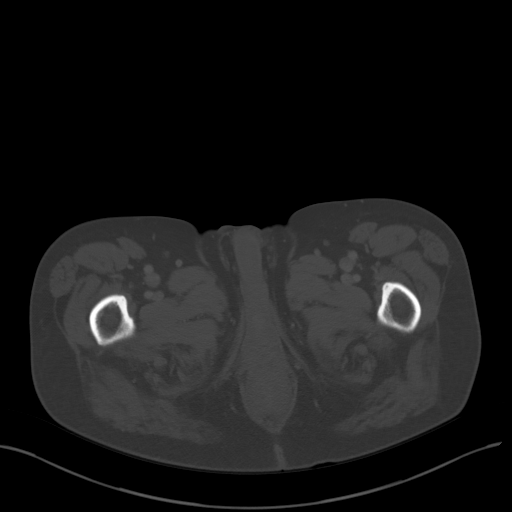
[im 15/101  soft-tissue]
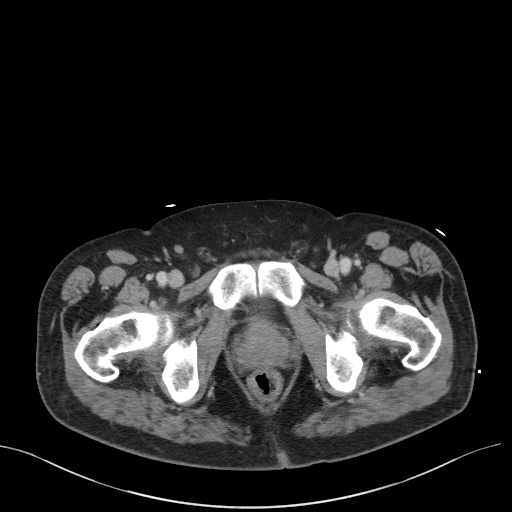
[im 24/101  soft-tissue]
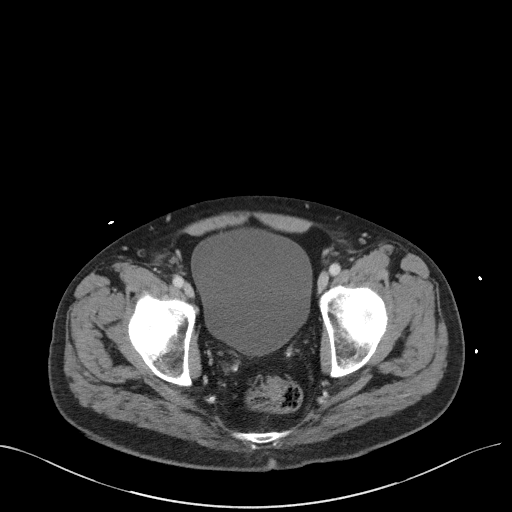
[im 29/101  soft-tissue]
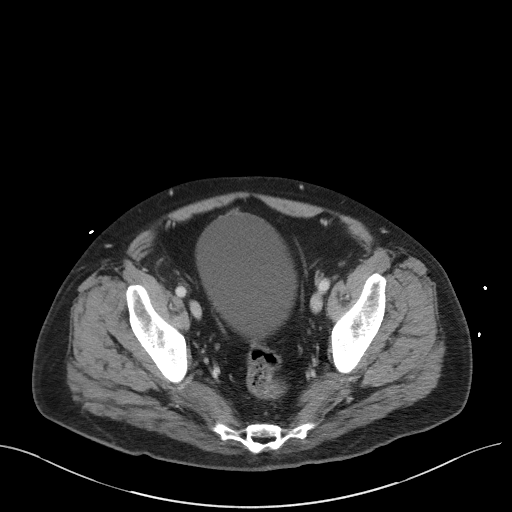
[im 39/101  soft-tissue]
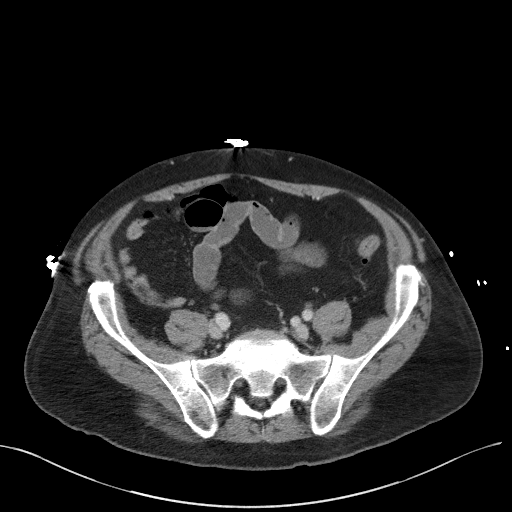
[im 48/101  soft-tissue]
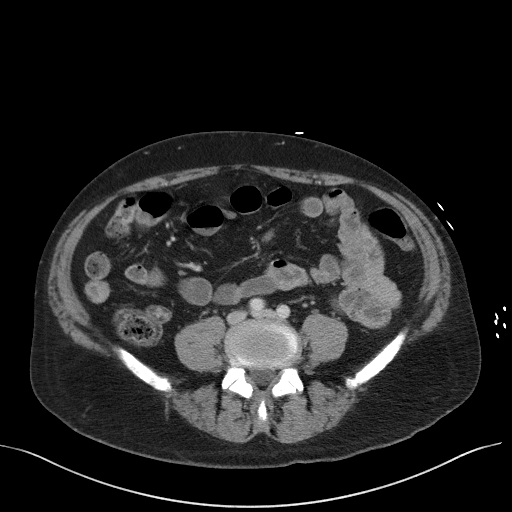
[im 53/101  soft-tissue]
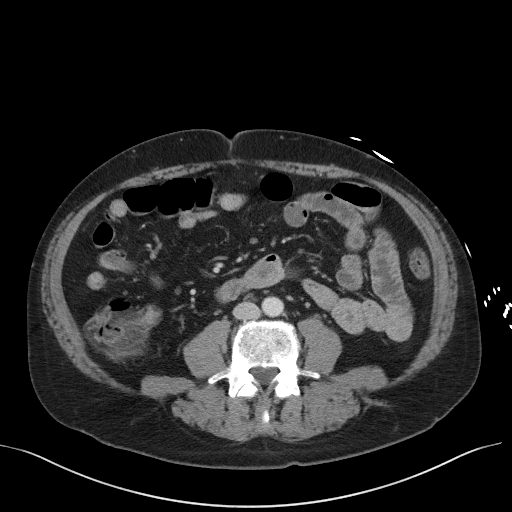
[im 62/101  soft-tissue]
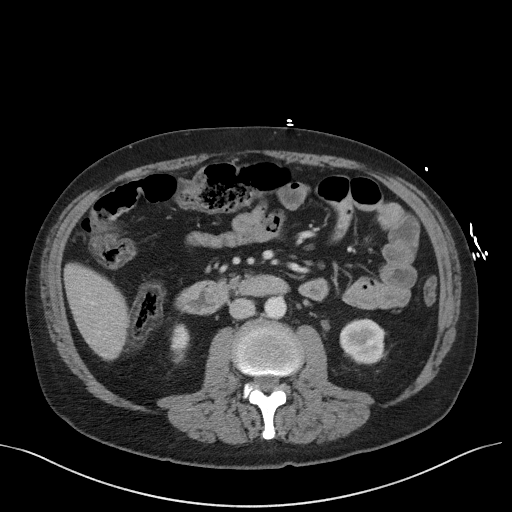
[im 72/101  soft-tissue]
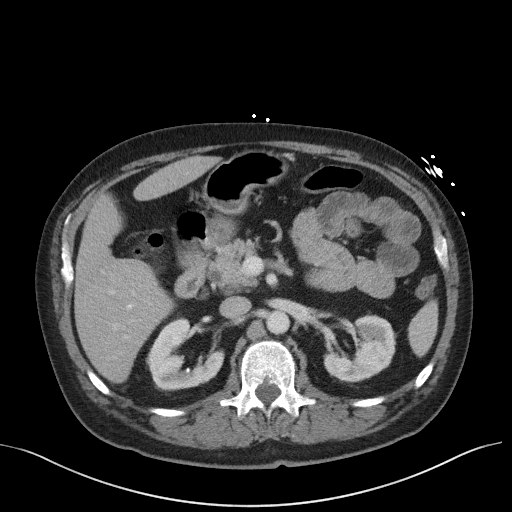
[im 72/101  bone]
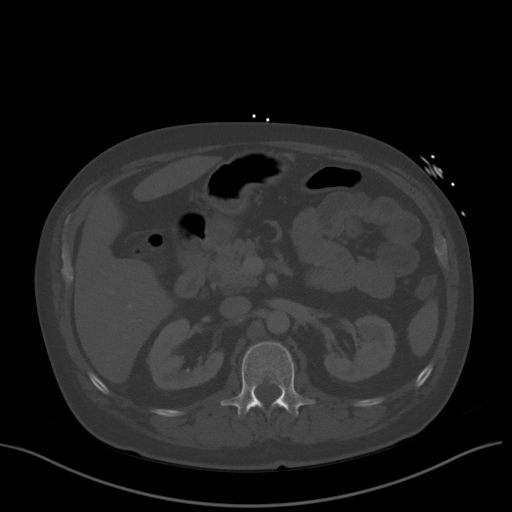
[im 77/101  soft-tissue]
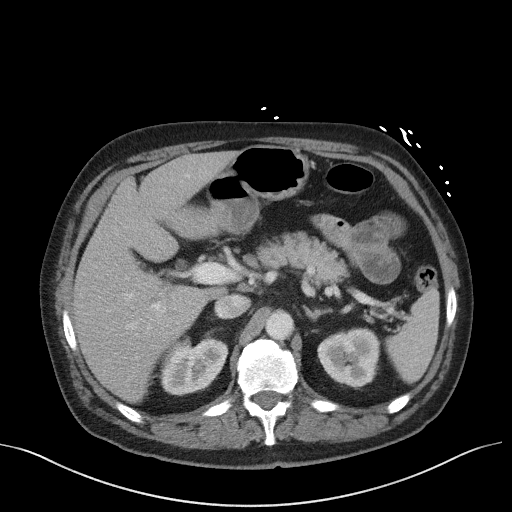
[im 86/101  soft-tissue]
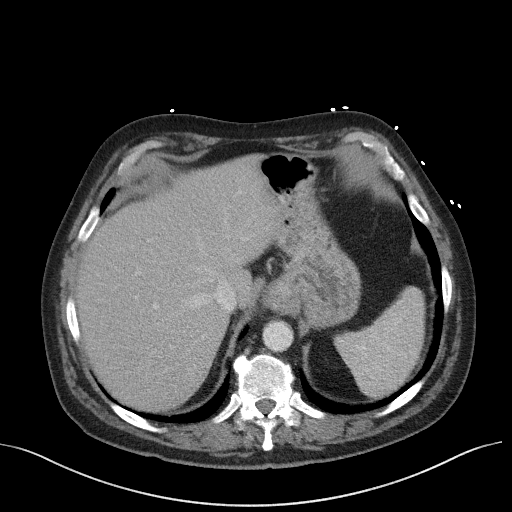
[im 96/101  soft-tissue]
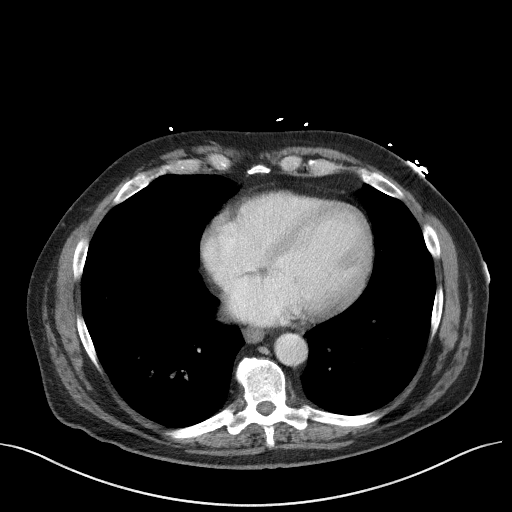

[Series 5: coronal st · coronal · 0.75mm/px · 3 of 88 slices shown]
[im 30/88  soft-tissue]
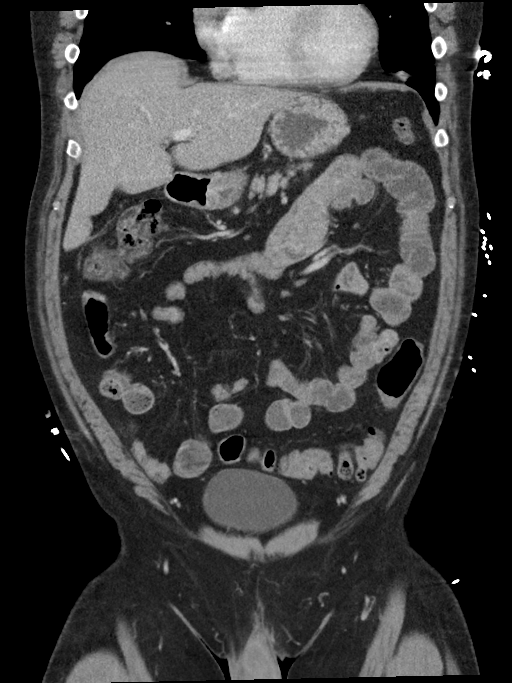
[im 39/88  soft-tissue]
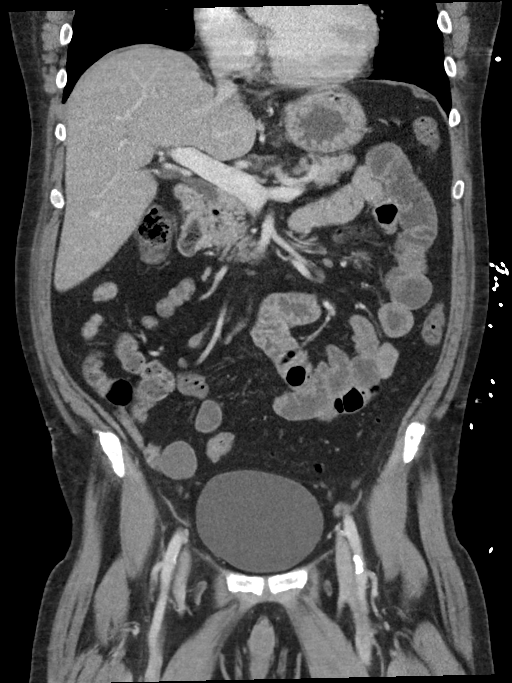
[im 49/88  soft-tissue]
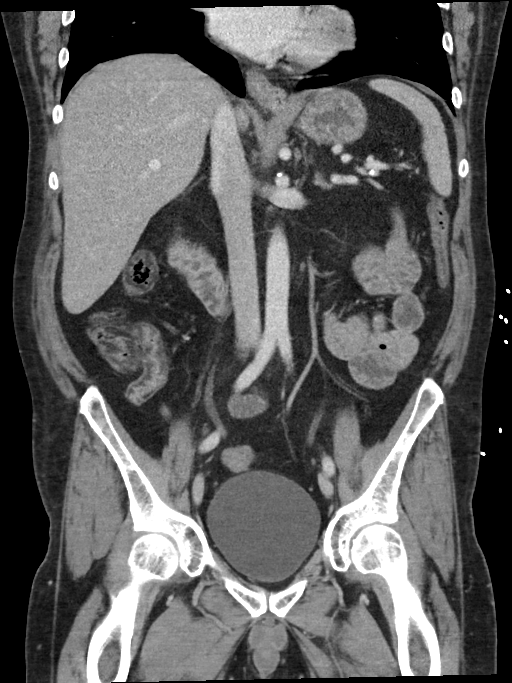

[15 of 46 positions shown; findings below may reference images not displayed]

FINDINGS: Lower chest: Lung bases are clear without focal nodule, mass, or
airspace disease. The heart size is normal. There is no significant
pleural or pericardial effusion.

Hepatobiliary: No focal liver abnormality is seen. Status post
cholecystectomy. No biliary dilatation.

Pancreas: Unremarkable. No pancreatic ductal dilatation or
surrounding inflammatory changes.

Spleen: Normal in size without focal abnormality.

Adrenals/Urinary Tract: The adrenal glands are normal bilaterally.
Bilateral punctate nonobstructing stones are again seen. Previously
noted left ureteral stone has cleared. Ureters are within normal
limits bilaterally. The urinary bladder is normal. There is no
hydronephrosis.

Stomach/Bowel: Stomach is within normal limits. Duodenum is
unremarkable. Small bowel is within normal limits. Terminal ileum is
unremarkable. The appendix is visualized and normal. The ascending
and transverse colon is normal. Mild diverticular changes are
present in the sigmoid colon no inflammatory changes are evident to
suggest diverticulitis.

Vascular/Lymphatic: Atherosclerotic changes are noted in the aorta
without aneurysm or focal stenosis.

Reproductive: Prostate gland is enlarged, measuring 5 cm transverse
diameter.

Other: No abdominal wall hernia or abnormality. No abdominopelvic
ascites.

Musculoskeletal: Vertebral body heights and alignment are normal. No
focal lytic or blastic lesions are present. Bony pelvis is normal.
Hips are located and within limits.
IMPRESSION: 1. Acute or focal lesion to explain abdominal pain.
2. Mild diverticular changes in the sigmoid colon without evidence
for diverticulitis.
3. Previously noted obstructing in the left kidney stone and
hydronephrosis is cleared.
4. Multiple bilateral nonobstructing stones are again present in
both kidneys.

## 2019-10-25 ENCOUNTER — Telehealth: Payer: Self-pay | Admitting: Pharmacist

## 2019-10-25 NOTE — Telephone Encounter (Signed)
10/25/2019 3:24:28 PM - Ventolin refill online with [facility]  10/25/2019 I placed a refill online with [facility] for Ventolin, to ship 11/07/2019, Order# [id number].Miguel [name]

## 2019-10-26 ENCOUNTER — Other Ambulatory Visit: Payer: Self-pay

## 2019-10-26 ENCOUNTER — Ambulatory Visit: Payer: Medicaid Other | Admitting: Gerontology

## 2019-10-26 ENCOUNTER — Encounter: Payer: Self-pay | Admitting: Gerontology

## 2019-10-26 VITALS — BP 156/98 | HR 108 | Ht 72.0 in | Wt 193.1 lb

## 2019-10-26 DIAGNOSIS — F411 Generalized anxiety disorder: Secondary | ICD-10-CM

## 2019-10-26 DIAGNOSIS — M25561 Pain in right knee: Secondary | ICD-10-CM

## 2019-10-26 DIAGNOSIS — I10 Essential (primary) hypertension: Secondary | ICD-10-CM

## 2019-10-26 DIAGNOSIS — F1010 Alcohol abuse, uncomplicated: Secondary | ICD-10-CM

## 2019-10-26 DIAGNOSIS — Z0000 Encounter for general adult medical examination without abnormal findings: Secondary | ICD-10-CM

## 2019-10-26 DIAGNOSIS — M109 Gout, unspecified: Secondary | ICD-10-CM

## 2019-10-26 DIAGNOSIS — R000 Tachycardia, unspecified: Secondary | ICD-10-CM

## 2019-10-26 DIAGNOSIS — K219 Gastro-esophageal reflux disease without esophagitis: Secondary | ICD-10-CM

## 2019-10-26 MED ORDER — CLONIDINE HCL 0.1 MG PO TABS: 0.1000 mg | ORAL_TABLET | Freq: Every day | ORAL | 0 refills | Status: DC

## 2019-10-26 MED ORDER — FOLIC ACID 1 MG PO TABS: 1.0000 mg | ORAL_TABLET | Freq: Every day | ORAL | 1 refills | Status: DC

## 2019-10-26 MED ORDER — LISINOPRIL 40 MG PO TABS: 40.0000 mg | ORAL_TABLET | Freq: Every day | ORAL | 1 refills | Status: DC

## 2019-10-26 MED ORDER — ALLOPURINOL 300 MG PO TABS: 300.0000 mg | ORAL_TABLET | Freq: Every day | ORAL | 1 refills | Status: DC

## 2019-10-26 MED ORDER — PANTOPRAZOLE SODIUM 40 MG PO TBEC: 40.0000 mg | DELAYED_RELEASE_TABLET | Freq: Every day | ORAL | 2 refills | Status: DC

## 2019-10-26 NOTE — Patient Instructions (Signed)
Call and Schedule appointment with [facility] @ [phone] for Anxiety and Alcohol abuse Complete and return [facility] care application     [place] stands for "Dietary Approaches to Stop Hypertension." The DASH eating plan is a healthy eating plan that has been shown to reduce high blood pressure (hypertension). It may also reduce your risk for type 2 diabetes, heart disease, and stroke. The DASH eating plan may also help with weight loss. What are tips for following this plan?  General guidelines  Avoid eating more than 2,300 mg (milligrams) of salt (sodium) a day. If you have hypertension, you may need to reduce your sodium intake to 1,500 mg a day.  Limit alcohol intake to no more than 1 drink a day for nonpregnant women and 2 drinks a day for men. One drink equals 12 oz of beer, 5 oz of wine, or 1 oz of hard liquor.  Work with your health care provider to maintain a healthy body weight or to lose weight. Ask what an ideal weight is for you.  Get at least 30 minutes of exercise that causes your heart to beat faster (aerobic exercise) most days of the week. Activities may include walking, swimming, or biking.  Work with your health care provider or diet and nutrition specialist (dietitian) to adjust your eating plan to your individual calorie needs. Reading food labels   Check food labels for the amount of sodium per serving. Choose foods with less than 5 percent of the Daily Value of sodium. Generally, foods with less than 300 mg of sodium per serving fit into this eating plan.  To find whole grains, look for the word "whole" as the first word in the ingredient list. Shopping  Buy products labeled as "low-sodium" or "no salt added."  Buy fresh foods. Avoid canned foods and premade or frozen meals. Cooking  Avoid adding salt when cooking. Use salt-free seasonings or herbs instead of table salt or sea salt. Check with your health care provider or pharmacist before  using salt substitutes.  Do not fry foods. Cook foods using healthy methods such as baking, boiling, grilling, and broiling instead.  Cook with heart-healthy oils, such as olive, canola, soybean, or sunflower oil. Meal planning  Eat a balanced diet that includes: ? 5 or more servings of fruits and vegetables each day. At each meal, try to fill half of your plate with fruits and vegetables. ? Up to 6-8 servings of whole grains each day. ? Less than 6 oz of lean meat, poultry, or fish each day. A 3-oz serving of meat is about the same size as a deck of cards. One egg equals 1 oz. ? 2 servings of low-fat dairy each day. ? A serving of nuts, seeds, or beans 5 times each week. ? Heart-healthy fats. Healthy fats called Omega-3 fatty acids are found in foods such as flaxseeds and coldwater fish, like sardines, salmon, and mackerel.  Limit how much you eat of the following: ? Canned or prepackaged foods. ? Food that is high in trans fat, such as fried foods. ? Food that is high in saturated fat, such as fatty meat. ? Sweets, desserts, sugary drinks, and other foods with added sugar. ? Full-fat dairy products.  Do not salt foods before eating.  Try to eat at least 2 vegetarian meals each week.  Eat more home-cooked food and less restaurant, buffet, and fast food.  When eating at a restaurant, ask that your food be prepared with  less salt or no salt, if possible. What foods are recommended? The items listed may not be a complete list. Talk with your dietitian about what dietary choices are best for you. Grains Whole-grain or whole-wheat bread. Whole-grain or whole-wheat pasta. Brown rice. [place]. Bulgur. Whole-grain and low-sodium cereals. Pita bread. Low-fat, low-sodium crackers. Whole-wheat flour tortillas. Vegetables Fresh or frozen vegetables (raw, steamed, roasted, or grilled). Low-sodium or reduced-sodium tomato and vegetable juice. Low-sodium or reduced-sodium tomato sauce and  tomato paste. Low-sodium or reduced-sodium canned vegetables. Fruits All fresh, dried, or frozen fruit. Canned fruit in natural juice (without added sugar). Meat and other protein foods Skinless chicken or [place]. Ground chicken or [place]. Pork with fat trimmed off. Fish and seafood. Egg whites. Dried beans, peas, or lentils. Unsalted nuts, nut butters, and seeds. Unsalted canned beans. Lean cuts of beef with fat trimmed off. Low-sodium, lean deli meat. Dairy Low-fat (1%) or fat-free (skim) milk. Fat-free, low-fat, or reduced-fat cheeses. Nonfat, low-sodium ricotta or cottage cheese. Low-fat or nonfat yogurt. Low-fat, low-sodium cheese. Fats and oils Soft margarine without trans fats. Vegetable oil. Low-fat, reduced-fat, or light mayonnaise and salad dressings (reduced-sodium). Canola, safflower, olive, soybean, and sunflower oils. Avocado. Seasoning and other foods Herbs. Spices. Seasoning mixes without salt. Unsalted popcorn and pretzels. Fat-free sweets. What foods are not recommended? The items listed may not be a complete list. Talk with your dietitian about what dietary choices are best for you. Grains Baked goods made with fat, such as croissants, muffins, or some breads. Dry pasta or rice meal packs. Vegetables Creamed or fried vegetables. Vegetables in a cheese sauce. Regular canned vegetables (not low-sodium or reduced-sodium). Regular canned tomato sauce and paste (not low-sodium or reduced-sodium). Regular tomato and vegetable juice (not low-sodium or reduced-sodium). Angie Fava. Olives. Fruits Canned fruit in a light or heavy syrup. Fried fruit. Fruit in cream or butter sauce. Meat and other protein foods Fatty cuts of meat. Ribs. Fried meat. Berniece Salines. Sausage. Bologna and other processed lunch meats. Salami. Fatback. Hotdogs. Bratwurst. Salted nuts and seeds. Canned beans with added salt. Canned or smoked fish. Whole eggs or egg yolks. Chicken or [place] with skin. Dairy Whole or 2%  milk, cream, and half-and-half. Whole or full-fat cream cheese. Whole-fat or sweetened yogurt. Full-fat cheese. Nondairy creamers. Whipped toppings. Processed cheese and cheese spreads. Fats and oils Butter. Stick margarine. Lard. Shortening. Ghee. Bacon fat. Tropical oils, such as coconut, palm kernel, or palm oil. Seasoning and other foods Salted popcorn and pretzels. Onion salt, garlic salt, seasoned salt, table salt, and sea salt. Worcestershire sauce. Tartar sauce. Barbecue sauce. Teriyaki sauce. Soy sauce, including reduced-sodium. Steak sauce. Canned and packaged gravies. Fish sauce. Oyster sauce. Cocktail sauce. Horseradish that you find on the shelf. Ketchup. Mustard. Meat flavorings and tenderizers. Bouillon cubes. Hot sauce and Tabasco sauce. Premade or packaged marinades. Premade or packaged taco seasonings. Relishes. Regular salad dressings. Where to find more information:  National Heart, Lung, and [place]: https://wilson-eaton.com/  American Heart Association: www.heart.org Summary  The DASH eating plan is a healthy eating plan that has been shown to reduce high blood pressure (hypertension). It may also reduce your risk for type 2 diabetes, heart disease, and stroke.  With the DASH eating plan, you should limit salt (sodium) intake to 2,300 mg a day. If you have hypertension, you may need to reduce your sodium intake to 1,500 mg a day.  When on the DASH eating plan, aim to eat more fresh fruits and vegetables, whole grains, lean proteins,  low-fat dairy, and heart-healthy fats.  Work with your health care provider or diet and nutrition specialist (dietitian) to adjust your eating plan to your individual calorie needs. This information is not intended to replace advice given to you by your health care provider. Make sure you discuss any questions you have with your health care provider. Document Released: 10/22/2011 Document Revised: 10/15/2017 Document Reviewed: 10/26/2016  Elsevier Patient Education  2020 [employer].

## 2019-10-26 NOTE — Progress Notes (Signed)
Established Patient Office Visit  Subjective:  Patient ID: Miguel [name], male    DOB: 05-24-1961  Age: 58 y.o. MRN: [id number]  CC:  Chief Complaint  Patient presents with  . Gout  . Hypertension    HPI Miguel [name] presents for follow up of hypertension, alcohol abuse and post ED visit. He was seen at the ED on 10/14/2019 for Anxiety and shaking.  He is not compliant with his follow-up appointments, but he states that he adheres to his medications. but has not being checking his blood pressure lately.  He states that he had daily episodes of small amount of bright red blood in his stool for seven days, 2 weeks ago but it has resolved.  He states that he has a history of hemorrhoid.  He also states that he woke up with intermittent non radiating  6/10 achy pain to his right knee that started the previous day. He denies trauma or injury and states that he has a history of gout and and took colchicine with minimal relief.  He states that the pain affects his walking.  He reports the reoccurrence of his acid reflux and has not being taking his Protonix.  He also states that he consumed 2 small bottles of vodka 5 days ago, he denies tremors and withdrawal symptoms.  He states that he is yet to contact [facility]  for substance abuse counseling.  He states that he is doing well and offers no further complaint.   Past Medical History:  Diagnosis Date  . Alcohol abuse   . Anxiety   . Asthma   . GERD (gastroesophageal reflux disease)   . Gout   . Hypertension   . Kidney stone   . OCD (obsessive compulsive disorder)   . Renal colic     Past Surgical History:  Procedure Laterality Date  . CHOLECYSTECTOMY  2012  . EXTRACORPOREAL SHOCK WAVE LITHOTRIPSY Left 01/12/2019   Procedure: EXTRACORPOREAL SHOCK WAVE LITHOTRIPSY (ESWL);  Surgeon: Billey Co, MD;  Location: [facility] ORS;  Service: Urology;  Laterality: Left;    Family History  Problem Relation Age of Onset  . Alcohol abuse Father   .  Breast cancer Mother 33    Social History   Socioeconomic History  . Marital status: Married    Spouse name: Not on file  . Number of children: Not on file  . Years of education: Not on file  . Highest education level: Not on file  Occupational History  . Not on file  Tobacco Use  . Smoking status: Former Smoker    Types: Cigarettes  . Smokeless tobacco: Never Used  . Tobacco comment: quit 30 years ago  Substance and Sexual Activity  . Alcohol use: Yes    Alcohol/week: 3.0 - 4.0 standard drinks    Types: 3 - 4 Shots of liquor per week    Comment: daily  . Drug use: No  . Sexual activity: Yes  Other Topics Concern  . Not on file  Social History Narrative   Lives at home with his wife and takes care of his wife. Independent at baseline      - Biggest strain is financial but doesn't think they could got get more help.      Patient expressed interest in possible financial assistance. Informed written consent obtained   Social Determinants of Health   Financial Resource Strain: High Risk  . Difficulty of Paying Living Expenses: Very hard  Food Insecurity: No Food  Insecurity  . Worried About Charity fundraiser in the Last Year: Never true  . Ran Out of Food in the Last Year: Never true  Transportation Needs: No Transportation Needs  . Lack of Transportation (Medical): No  . Lack of Transportation (Non-Medical): No  Physical Activity: Insufficiently Active  . Days of Exercise per Week: 2 days  . Minutes of Exercise per Session: 10 min  Stress: Stress Concern Present  . Feeling of Stress : Very much  Social Connections: Unknown  . Frequency of Communication with Friends and Family: Once a week  . Frequency of Social Gatherings with Friends and Family: Not on file  . Attends Religious Services: Never  . Active Member of Clubs or Organizations: No  . Attends Archivist Meetings: Never  . Marital Status: Married  Human resources officer Violence: Not At Risk  . Fear  of Current or Ex-Partner: No  . Emotionally Abused: No  . Physically Abused: No  . Sexually Abused: No    Outpatient Medications Prior to Visit  Medication Sig Dispense Refill  . ascorbic acid (VITAMIN C) 500 MG tablet Take 1 tablet (500 mg total) by mouth daily. 30 tablet 1  . citalopram (CELEXA) 20 MG tablet Take 1 tablet (20 mg total) by mouth daily. 30 tablet 1  . colchicine 0.6 MG tablet Take 1 tablet (0.6 mg total) by mouth daily as needed. 30 tablet 1  . cyclobenzaprine (FLEXERIL) 5 MG tablet Take 1 tablet (5 mg total) by mouth at bedtime. 30 tablet 0  . hydrOXYzine (ATARAX/VISTARIL) 25 MG tablet Take 1 tablet (25 mg total) by mouth 3 (three) times daily as needed for anxiety. 60 tablet 1  . Multiple Vitamins-Iron (MULTIVITAMINS WITH IRON) TABS tablet Take 1 tablet by mouth daily. 30 tablet 1  . QUEtiapine (SEROQUEL) 50 MG tablet Take 1 tablet (50 mg total) by mouth at bedtime. 60 tablet 1  . thiamine 250 MG tablet Take 1 tablet (250 mg total) by mouth daily. 30 tablet 1  . allopurinol (ZYLOPRIM) 300 MG tablet Take 1 tablet (300 mg total) by mouth daily. 30 tablet 1  . cloNIDine (CATAPRES) 0.1 MG tablet Take 1 tablet (0.1 mg total) by mouth daily. 30 tablet 0  . folic acid (FOLVITE) 1 MG tablet Take 1 tablet (1 mg total) by mouth daily. 30 tablet 1  . lisinopril (ZESTRIL) 40 MG tablet Take 1 tablet (40 mg total) by mouth daily. 30 tablet 1  . Fluticasone-Salmeterol (ADVAIR DISKUS) 100-50 MCG/DOSE AEPB Inhale 1 puff into the lungs 2 (two) times daily. 180 each 0  . gabapentin (NEURONTIN) 100 MG capsule Take 2 capsules (200 mg total) by mouth 3 (three) times daily. (Patient not taking: Reported on 10/26/2019) 90 capsule 1   No facility-administered medications prior to visit.    Allergies  Allergen Reactions  . Percocet [Oxycodone-Acetaminophen] Nausea And Vomiting    ROS Review of Systems  Constitutional: Negative.   Respiratory: Negative.   Cardiovascular: Negative.    Gastrointestinal: Positive for blood in stool.  Musculoskeletal: Positive for arthralgias (Right knee pain).  Neurological: Negative.   Psychiatric/Behavioral: Negative.       Objective:    Physical Exam  Constitutional: He is oriented to person, place, and time. He appears well-developed.  HENT:  Head: Normocephalic and atraumatic.  Cardiovascular: Regular rhythm. Tachycardia present.  Pulmonary/Chest: Effort normal and breath sounds normal.  Abdominal: Soft. Bowel sounds are normal. He exhibits no distension. There is no abdominal tenderness.  Musculoskeletal:  Right knee: No deformity or erythema. Decreased range of motion (Limping when walking). Tenderness (4/10 pain with palpation) present.  Neurological: He is alert and oriented to person, place, and time.  Skin: Skin is warm and dry.  Psychiatric: He has a normal mood and affect. His behavior is normal. Judgment and thought content normal.    BP (!) 156/98 (BP Location: Left Arm, Patient Position: Sitting, Cuff Size: Large)   Pulse (!) 108   Ht 6' (1.829 m)   Wt 193 lb 1.6 oz (87.6 kg)   SpO2 99%   BMI 26.19 kg/m  Wt Readings from Last 3 Encounters:  10/26/19 193 lb 1.6 oz (87.6 kg)  10/14/19 185 lb (83.9 kg)  09/23/19 190 lb (86.2 kg)      Health Maintenance Due  Topic Date Due  . Hepatitis C Screening  May 10, 1961  . COLONOSCOPY  10/11/2009    There are no preventive care reminders to display for this patient.  Lab Results  Component Value Date   TSH 0.545 05/07/2019   Lab Results  Component Value Date   WBC 5.8 08/04/2019   HGB 12.0 (L) 08/04/2019   HCT 38.1 (L) 08/04/2019   MCV 88.2 08/04/2019   PLT 280 08/04/2019   Lab Results  Component Value Date   NA 141 08/04/2019   K 3.2 (L) 08/04/2019   CO2 24 08/04/2019   GLUCOSE 171 (H) 08/04/2019   BUN 12 08/04/2019   CREATININE 1.16 08/04/2019   BILITOT 0.6 08/04/2019   ALKPHOS 59 08/04/2019   AST 12 (L) 08/04/2019   ALT 11 08/04/2019    PROT 6.6 08/04/2019   ALBUMIN 3.8 08/04/2019   CALCIUM 9.1 08/04/2019   ANIONGAP 11 08/04/2019   Lab Results  Component Value Date   CHOL 144 03/12/2017   Lab Results  Component Value Date   HDL 52 03/12/2017   Lab Results  Component Value Date   LDLCALC 71 03/12/2017   Lab Results  Component Value Date   TRIG 107 03/12/2017   Lab Results  Component Value Date   CHOLHDL 2.8 03/12/2017   Lab Results  Component Value Date   HGBA1C 5.1 03/12/2017      Assessment & Plan:   1. Essential hypertension - His blood pressure is uncontrolled, his goal is < 150/90 and normal is less than 120/80. He was advised to continue on his medication, check his blood pressure daily, record and bring log to follow-up appointment.  He was advised to continue on DASH diet. - cloNIDine (CATAPRES) 0.1 MG tablet; Take 1 tablet (0.1 mg total) by mouth daily.  Dispense: 30 tablet; Refill: 0 - lisinopril (ZESTRIL) 40 MG tablet; Take 1 tablet (40 mg total) by mouth daily.  Dispense: 30 tablet; Refill: 1  2. Alcohol abuse -He was strongly encouraged to abstain from alcohol, and to schedule an appointment at [facility]. - folic acid (FOLVITE) 1 MG tablet; Take 1 tablet (1 mg total) by mouth daily.  Dispense: 30 tablet; Refill: 1   3. Health care maintenance - He was encouraged to complete charity care application for - Ambulatory referral to Gastroenterology for Colonoscopy screening. He was advised to go to the ED for hematochezia or Melena. - Lipid panel; Future - CBC w/Diff; Future - Comp Met (CMET); Future  4. Gout, unspecified cause, unspecified chronicity, unspecified site -He will continue on current medication. - allopurinol (ZYLOPRIM) 300 MG tablet; Take 1 tablet (300 mg total) by mouth daily.  Dispense: 30 tablet; Refill: 1  5. Gastroesophageal reflux disease without esophagitis -His acid reflux is not controlled, and he will continue on Protonix. -Avoid spicy, fatty and fried food -Avoid  sodas and sour juices -Avoid heavy meals -Avoid eating 4 hours before bedtime -Elevate head of bed at night - pantoprazole (PROTONIX) 40 MG tablet; Take 1 tablet (40 mg total) by mouth daily.  Dispense: 30 tablet; Refill: 2  6. Tachycardia -His heart rate was 108 bpm, he was advised to go to the emergency room  with chest pain, palpitation and lightheadedness.  He was also encouraged on alcohol abstinence and increase water intake.  7. Acute pain of right knee -Right knee pain mild likely be due to gout flare, he will continue on allopurinol and was advised to take 650 mg Tylenol every 8 hours for pain as needed and not to exceed 4000 mg in a day.  He was advised to notify clinic or go to the ED for worsening symptoms.     Follow-up: Return in about 7 weeks (around 12/13/2019), or if symptoms worsen or fail to improve.    Bleu Moisan Jerold Coombe, NP

## 2019-10-31 ENCOUNTER — Encounter: Payer: Self-pay | Admitting: *Deleted

## 2019-11-13 ENCOUNTER — Other Ambulatory Visit: Payer: Self-pay

## 2019-11-13 ENCOUNTER — Emergency Department
Admission: EM | Admit: 2019-11-13 | Discharge: 2019-11-13 | Disposition: A | Discharge status: Home / Self Care | Payer: Self-pay | Attending: Emergency Medicine | Admitting: Emergency Medicine

## 2019-11-13 ENCOUNTER — Emergency Department: Payer: Self-pay

## 2019-11-13 ENCOUNTER — Encounter: Payer: Self-pay | Admitting: *Deleted

## 2019-11-13 DIAGNOSIS — K529 Noninfective gastroenteritis and colitis, unspecified: Secondary | ICD-10-CM | POA: Insufficient documentation

## 2019-11-13 DIAGNOSIS — R1031 Right lower quadrant pain: Secondary | ICD-10-CM

## 2019-11-13 DIAGNOSIS — Z79899 Other long term (current) drug therapy: Secondary | ICD-10-CM | POA: Insufficient documentation

## 2019-11-13 DIAGNOSIS — Z87891 Personal history of nicotine dependence: Secondary | ICD-10-CM | POA: Insufficient documentation

## 2019-11-13 LAB — COMPREHENSIVE METABOLIC PANEL
ALT: 13 U/L (ref 0–44)
AST: 16 U/L (ref 15–41)
Albumin: 4.1 g/dL (ref 3.5–5.0)
Alkaline Phosphatase: 67 U/L (ref 38–126)
Anion gap: 11 (ref 5–15)
BUN: 16 mg/dL (ref 6–20)
CO2: 24 mmol/L (ref 22–32)
Calcium: 9.2 mg/dL (ref 8.9–10.3)
Chloride: 104 mmol/L (ref 98–111)
Creatinine, Ser: 1.09 mg/dL (ref 0.61–1.24)
GFR calc Af Amer: >60 mL/min (ref >60)
GFR calc non Af Amer: >60 mL/min (ref >60)
Glucose, Bld: 114 mg/dL — ABNORMAL HIGH (ref 70–99)
Potassium: 3.5 mmol/L (ref 3.5–5.1)
Sodium: 139 mmol/L (ref 135–145)
Total Bilirubin: 1.1 mg/dL (ref 0.3–1.2)
Total Protein: 7.5 g/dL (ref 6.5–8.1)

## 2019-11-13 LAB — URINALYSIS, COMPLETE (UACMP) WITH MICROSCOPIC
Bilirubin Urine: NEGATIVE
Glucose, UA: NEGATIVE mg/dL
Hgb urine dipstick: NEGATIVE
Ketones, ur: NEGATIVE mg/dL
Nitrite: NEGATIVE
Protein, ur: NEGATIVE mg/dL
Specific Gravity, Urine: 1.003 — ABNORMAL LOW (ref 1.005–1.030)
Squamous Epithelial / HPF: NONE SEEN (ref 0–5)
pH: 6 (ref 5.0–8.0)

## 2019-11-13 LAB — CBC
HCT: 41.8 % (ref 39.0–52.0)
Hemoglobin: 13.5 g/dL (ref 13.0–17.0)
MCH: 29 pg (ref 26.0–34.0)
MCHC: 32.3 g/dL (ref 30.0–36.0)
MCV: 89.7 fL (ref 80.0–100.0)
Platelets: 196 10*3/uL (ref 150–400)
RBC: 4.66 MIL/uL (ref 4.22–5.81)
RDW: 14.7 % (ref 11.5–15.5)
WBC: 6.8 10*3/uL (ref 4.0–10.5)
nRBC: 0 % (ref 0.0–0.2)

## 2019-11-13 LAB — LIPASE, BLOOD: Lipase: 41 U/L (ref 11–51)

## 2019-11-13 MED ORDER — METRONIDAZOLE 500 MG PO TABS: 500.0000 mg | ORAL_TABLET | Freq: Three times a day (TID) | ORAL | 0 refills | Status: DC

## 2019-11-13 MED ORDER — ONDANSETRON 4 MG PO TBDP: 4.0000 mg | ORAL_TABLET | Freq: Three times a day (TID) | ORAL | 0 refills | Status: DC | PRN

## 2019-11-13 MED ORDER — NAPROXEN 500 MG PO TABS: 500.0000 mg | ORAL_TABLET | Freq: Two times a day (BID) | ORAL | 0 refills | Status: DC

## 2019-11-13 MED ORDER — KETOROLAC TROMETHAMINE 30 MG/ML IJ SOLN: 15.0000 mg | INTRAMUSCULAR | Status: DC

## 2019-11-13 MED ORDER — KETOROLAC TROMETHAMINE 10 MG PO TABS
10.0000 mg | ORAL_TABLET | Freq: Once | ORAL | Status: DC
  Filled 2019-11-13: qty 1

## 2019-11-13 MED ORDER — KETOROLAC TROMETHAMINE 10 MG PO TABS
10.0000 mg | ORAL_TABLET | Freq: Once | ORAL | Status: AC
  Administered 2019-11-13: 10 mg via ORAL

## 2019-11-13 MED ORDER — METRONIDAZOLE 500 MG PO TABS
500.0000 mg | ORAL_TABLET | Freq: Once | ORAL | Status: AC
  Administered 2019-11-13: 500 mg via ORAL
  Filled 2019-11-13: qty 1

## 2019-11-13 MED ORDER — CIPROFLOXACIN HCL 500 MG PO TABS
500.0000 mg | ORAL_TABLET | Freq: Once | ORAL | Status: AC
  Administered 2019-11-13: 500 mg via ORAL
  Filled 2019-11-13: qty 1

## 2019-11-13 MED ORDER — CIPROFLOXACIN HCL 500 MG PO TABS: 500.0000 mg | ORAL_TABLET | Freq: Two times a day (BID) | ORAL | 0 refills | Status: DC

## 2019-11-13 NOTE — Discharge Instructions (Signed)
Your CT scan shows inflammation of your large intestine on the right side of your abdomen.  This can be treated with nausea medicine and antibiotics.  Please follow up with your doctor in a week.  If you have severe pain, fever, inability to eat or drink, or other new concerns, please return to the ER for repeat evaluation.

## 2019-11-13 NOTE — ED Provider Notes (Signed)
[facility] Emergency Department Provider Note  ____________________________________________  Time seen: Approximately 6:10 PM  I have reviewed the triage vital signs and the nursing notes.   HISTORY  Chief Complaint Abdominal Pain    HPI Miguel [name] is a 58 y.o. male with a history of kidney stones, hypertension, gout, GERD, anxiety who comes ED complaining of right lower quadrant abdominal pain for the past 3 days, gradual onset, nonradiating.  Associate with nausea and occasional loose stools, no vomiting.  No fevers chills or body aches.  Seems to be worse with movement and change in position.  No urinary symptoms.  Contrary to triage note, denies any current rectal bleeding.  No dizziness chest pain or shortness of breath.   Patient reports that he has been able to significantly cut back on his alcohol abuse recently.   Past Medical History:  Diagnosis Date  . Alcohol abuse   . Anxiety   . Asthma   . GERD (gastroesophageal reflux disease)   . Gout   . Hypertension   . Kidney stone   . OCD (obsessive compulsive disorder)   . Renal colic      Patient Active Problem List   Diagnosis Date Noted  . Health care maintenance 10/26/2019  . Right knee pain 10/26/2019  . Generalized anxiety disorder 10/14/2019  . MDD (major depressive disorder), recurrent severe, without psychosis ([place]) 07/27/2019  . Social anxiety disorder 07/27/2019  . Left-sided chest wall pain 06/14/2019  . Alcoholic cirrhosis of liver without ascites ([place]) 06/14/2019  . Alcohol abuse 06/14/2019  . AKI (acute kidney injury) ([place]) 05/27/2019  . Alcohol withdrawal ([place]) 05/07/2019  . Alcoholic intoxication with complication ([place])   . Sepsis ([place]) 03/08/2019  . Breast lump or mass 10/04/2018  . Sepsis secondary to UTI ([place]) 09/14/2018  . Asthma 07/07/2018  . GERD (gastroesophageal reflux disease) 07/07/2018  . OCD (obsessive compulsive disorder) 07/07/2018  . Self-inflicted  laceration of wrist ([place]) 03/10/2018  . Leg hematoma 12/25/2017  . Suicide and self-inflicted injury by cutting and piercing instrument ([place]) 03/10/2017  . Severe recurrent major 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 ([place]) 03/09/2017  . Substance induced mood disorder ([place]) 08/15/2016  . Involuntary commitment 08/15/2016  . Alcohol use disorder, severe, dependence ([place]) 02/05/2016  . Hypertension 12/05/2015  . Tachycardia 12/05/2015  . Gout 11/13/2015  . Chronic back pain 05/02/2015     Past Surgical History:  Procedure Laterality Date  . CHOLECYSTECTOMY  2012  . EXTRACORPOREAL SHOCK WAVE LITHOTRIPSY Left 01/12/2019   Procedure: EXTRACORPOREAL SHOCK WAVE LITHOTRIPSY (ESWL);  Surgeon: Billey Co, MD;  Location: [facility] ORS;  Service: Urology;  Laterality: Left;     Prior to Admission medications   Medication Sig Start Date End Date Taking? Authorizing Provider  allopurinol (ZYLOPRIM) 300 MG tablet Take 1 tablet (300 mg total) by mouth daily. 10/26/19   Iloabachie, Chioma E, NP  ascorbic acid (VITAMIN C) 500 MG tablet Take 1 tablet (500 mg total) by mouth daily. 08/01/19   Clapacs, Madie Reno, MD  ciprofloxacin (CIPRO) 500 MG tablet Take 1 tablet (500 mg total) by mouth 2 (two) times daily. 11/13/19   Carrie Mew, MD  citalopram (CELEXA) 20 MG tablet Take 1 tablet (20 mg total) by mouth daily. 08/01/19   Clapacs, Madie Reno, MD  cloNIDine (CATAPRES) 0.1 MG tablet Take 1 tablet (0.1 mg total) by mouth daily. 10/26/19   Iloabachie, Chioma E, NP  colchicine 0.6 MG tablet Take 1 tablet (0.6 mg total) by  mouth daily as needed. 08/01/19   Clapacs, Madie Reno, MD  cyclobenzaprine (FLEXERIL) 5 MG tablet Take 1 tablet (5 mg total) by mouth at bedtime. 09/23/19   Menshew, Dannielle Karvonen, PA-C  Fluticasone-Salmeterol (ADVAIR DISKUS) 100-50 MCG/DOSE AEPB Inhale 1 puff into the lungs 2 (two) times daily. 09/01/18   Tukov-Yual, Arlyss Gandy, NP  folic acid (FOLVITE) 1 MG tablet Take 1 tablet (1 mg total) by  mouth daily. 10/26/19   Iloabachie, Chioma E, NP  hydrOXYzine (ATARAX/VISTARIL) 25 MG tablet Take 1 tablet (25 mg total) by mouth 3 (three) times daily as needed for anxiety. 08/01/19   Clapacs, Madie Reno, MD  lisinopril (ZESTRIL) 40 MG tablet Take 1 tablet (40 mg total) by mouth daily. 10/26/19   Iloabachie, Chioma E, NP  metroNIDAZOLE (FLAGYL) 500 MG tablet Take 1 tablet (500 mg total) by mouth 3 (three) times daily. 11/13/19   Carrie Mew, MD  Multiple Vitamins-Iron (MULTIVITAMINS WITH IRON) TABS tablet Take 1 tablet by mouth daily. 08/01/19   Clapacs, Madie Reno, MD  naproxen (NAPROSYN) 500 MG tablet Take 1 tablet (500 mg total) by mouth 2 (two) times daily with a meal. 11/13/19   Carrie Mew, MD  ondansetron (ZOFRAN ODT) 4 MG disintegrating tablet Take 1 tablet (4 mg total) by mouth every 8 (eight) hours as needed for nausea or vomiting. 11/13/19   Carrie Mew, MD  pantoprazole (PROTONIX) 40 MG tablet Take 1 tablet (40 mg total) by mouth daily. 10/26/19   Iloabachie, Chioma E, NP  QUEtiapine (SEROQUEL) 50 MG tablet Take 1 tablet (50 mg total) by mouth at bedtime. 10/14/19   Patrecia Pour, NP  thiamine 250 MG tablet Take 1 tablet (250 mg total) by mouth daily. 08/01/19   Clapacs, Madie Reno, MD     Allergies Percocet [oxycodone-acetaminophen]   Family History  Problem Relation Age of Onset  . Alcohol abuse Father   . Breast cancer Mother 36    Social History Social History   Tobacco Use  . Smoking status: Former Smoker    Types: Cigarettes  . Smokeless tobacco: Never Used  . Tobacco comment: quit 30 years ago  Substance Use Topics  . Alcohol use: Yes    Alcohol/week: 3.0 - 4.0 standard drinks    Types: 3 - 4 Shots of liquor per week    Comment: daily  . Drug use: No    Review of Systems  Constitutional:   No fever or chills.  ENT:   No sore throat. No rhinorrhea. Cardiovascular:   No chest pain or syncope. Respiratory:   No dyspnea or cough. Gastrointestinal:  Positive right lower quadrant abdominal pain without vomiting.  Positive occasional diarrhea Musculoskeletal:   Negative for focal pain or swelling All other systems reviewed and are negative except as documented above in ROS and HPI.  ____________________________________________   PHYSICAL EXAM:  VITAL SIGNS: ED Triage Vitals  Enc Vitals Group     BP 11/13/19 1526 123/89     Pulse Rate 11/13/19 1526 (!) 101     Resp 11/13/19 1526 16     Temp 11/13/19 1526 98.4 F (36.9 C)     Temp src --      SpO2 11/13/19 1526 100 %     Weight 11/13/19 1527 193 lb 2 oz (87.6 kg)     Height --      Head Circumference --      Peak Flow --      Pain Score 11/13/19 1526 8  Pain Loc --      Pain Edu? --      Excl. in [place]? --     Vital signs reviewed, nursing assessments reviewed.   Constitutional:   Alert and oriented. Non-toxic appearance. Eyes:   Conjunctivae are normal. EOMI. PERRL. ENT      Head:   Normocephalic and atraumatic.      Nose:   Wearing a mask.      Mouth/Throat:   Wearing a mask.      Neck:   No meningismus. Full ROM. Hematological/Lymphatic/Immunilogical:   No cervical lymphadenopathy. Cardiovascular:   RRR. Symmetric bilateral radial and DP pulses.  No murmurs. Cap refill less than 2 seconds. Respiratory:   Normal respiratory effort without tachypnea/retractions. Breath sounds are clear and equal bilaterally. No wheezes/rales/rhonchi. Gastrointestinal:   Soft with right lower quadrant and right mid abdominal tenderness. Non distended. There is no CVA tenderness.  No rebound, rigidity, or guarding.  No hernias Musculoskeletal:   Normal range of motion in all extremities. No joint effusions.  No lower extremity tenderness.  No edema. Neurologic:   Normal speech and language.  Motor grossly intact. No acute focal neurologic deficits are appreciated.  Skin:    Skin is warm, dry and intact. No rash noted.  No petechiae, purpura, or  bullae.  ____________________________________________    LABS (pertinent positives/negatives) (all labs ordered are listed, but only abnormal results are displayed) Labs Reviewed  COMPREHENSIVE METABOLIC PANEL - Abnormal; Notable for the following components:      Result Value   Glucose, Bld 114 (*)    All other components within normal limits  URINALYSIS, COMPLETE (UACMP) WITH MICROSCOPIC - Abnormal; Notable for the following components:   Color, Urine STRAW (*)    APPearance CLEAR (*)    Specific Gravity, Urine 1.003 (*)    Leukocytes,Ua MODERATE (*)    Bacteria, UA RARE (*)    All other components within normal limits  LIPASE, BLOOD  CBC   ____________________________________________   EKG    ____________________________________________    RADIOLOGY  CT Renal Stone Study  Result Date: 11/13/2019 CLINICAL DATA:  Flank pain, and for 7 days.  RIGHT-sided pain. EXAM: CT ABDOMEN AND PELVIS WITHOUT CONTRAST TECHNIQUE: Multidetector CT imaging of the abdomen and pelvis was performed following the standard protocol without IV contrast. COMPARISON:  None. FINDINGS: Lower chest: Lung bases are clear. Hepatobiliary: No focal hepatic lesion. No biliary duct dilatation. Gallbladder is normal. Common bile duct is normal. Pancreas: Pancreas is normal. No ductal dilatation. No pancreatic inflammation. Spleen: Normal spleen Adrenals/urinary tract: Bilateral renal calculi are noted. 4 calculi in the RIGHT kidney ranging size from 3-6 mm. No RIGHT ureterolithiasis or obstructive uropathy. Five calculi in LEFT kidney ranging size from 2-5 mm. LEFT nephrolithiasis or ureterolithiasis. No obstructive uropathy. No bladder calculi. Stomach/Bowel: Stomach, small bowel, appendix, and cecum are normal. Mild submucosal edema the ascending colon transverse colon appears normal. Descending colon normal. Several diverticula of the sigmoid colon. No acute inflammation. Vascular/Lymphatic: Abdominal aorta is  normal caliber. No periportal or retroperitoneal adenopathy. No pelvic adenopathy. Reproductive: Prostate normal Other: No free fluid. Musculoskeletal: No aggressive osseous lesion. IMPRESSION: 1. Bilateral nephrolithiasis. No ureterolithiasis or obstructive uropathy. 2. Submucosal edema in the cecum and ascending colon is new from comparison exam. Findings suggest segmental colitis. Electronically Signed   By: Suzy Bouchard M.D.   On: 11/13/2019 19:28    ____________________________________________   PROCEDURES Procedures  ____________________________________________  DIFFERENTIAL DIAGNOSIS   Appendicitis, abdominal wall  strain, diverticulitis, renal colic  CLINICAL IMPRESSION / ASSESSMENT AND PLAN / ED COURSE  Medications ordered in the ED: Medications  ciprofloxacin (CIPRO) tablet 500 mg (has no administration in time range)  metroNIDAZOLE (FLAGYL) tablet 500 mg (has no administration in time range)  ketorolac (TORADOL) tablet 10 mg (10 mg Oral Given 11/13/19 1913)    Pertinent labs & imaging results that were available during my care of the patient were reviewed by me and considered in my medical decision making (see chart for details).  Miguel [name] was evaluated in Emergency Department on 11/13/2019 for the symptoms described in the history of present illness. He was evaluated in the context of the global COVID-19 pandemic, which necessitated consideration that the patient might be at risk for infection with the SARS-CoV-2 virus that causes COVID-19. Institutional protocols and algorithms that pertain to the evaluation of patients at risk for COVID-19 are in a state of rapid change based on information released by regulatory bodies including the CDC and federal and state organizations. These policies and algorithms were followed during the patient's care in the ED.   Patient presents with right lower quadrant abdominal pain and tenderness.  Surgical history significant for  cholecystectomy and lithotripsy.  He is nontoxic, abdomen is nonsurgical.  Need to obtain CT imaging to evaluate for stone versus appendicitis primarily.  Afterward, patient will be stable for discharge home if no severe findings are identified.   ----------------------------------------- 7:47 PM on 11/13/2019 -----------------------------------------  CT shows some minor evidence of colitis of the ascending colon.  No severe features or complications.  Patient is calm and comfortable nontoxic ambulatory.  Stable for discharge home on Cipro Flagyl and outpatient follow-up.  Return precautions given.     ____________________________________________   FINAL CLINICAL IMPRESSION(S) / ED DIAGNOSES    Final diagnoses:  Colitis  RLQ abdominal pain     ED Discharge Orders         Ordered    ciprofloxacin (CIPRO) 500 MG tablet  2 times daily     11/13/19 1945    metroNIDAZOLE (FLAGYL) 500 MG tablet  3 times daily     11/13/19 1945    ondansetron (ZOFRAN ODT) 4 MG disintegrating tablet  Every 8 hours PRN     11/13/19 1945    naproxen (NAPROSYN) 500 MG tablet  2 times daily with meals     11/13/19 1945          Portions of this note were generated with dragon dictation software. Dictation errors may occur despite best attempts at proofreading.   Carrie Mew, MD 11/13/19 [phone]

## 2019-11-13 NOTE — ED Triage Notes (Signed)
Pt to ED reporting lower abd pain x 2 days with intermittent nausea and diarrhea. No vomiting or fever. Bright red blood in stool reported intermittently but dx of hemorrhoids 5 years ago.

## 2019-11-20 ENCOUNTER — Telehealth: Payer: Self-pay | Admitting: Pharmacist

## 2019-11-20 NOTE — Telephone Encounter (Signed)
11/20/2019 1:38:25 PM - Advair 100/50 refill online with [facility]  11/20/2019 placed refill online with [facility] for Advair 100/50 [id number] to ship 12/28/2019, order# [id number].Delos Haring

## 2019-12-01 ENCOUNTER — Emergency Department
Admission: EM | Admit: 2019-12-01 | Discharge: 2019-12-02 | Disposition: A | Discharge status: Home / Self Care | Payer: Self-pay | Attending: Emergency Medicine | Admitting: Emergency Medicine

## 2019-12-01 ENCOUNTER — Other Ambulatory Visit: Payer: Self-pay

## 2019-12-01 DIAGNOSIS — Z79899 Other long term (current) drug therapy: Secondary | ICD-10-CM | POA: Insufficient documentation

## 2019-12-01 DIAGNOSIS — E876 Hypokalemia: Secondary | ICD-10-CM | POA: Insufficient documentation

## 2019-12-01 DIAGNOSIS — F10129 Alcohol abuse with intoxication, unspecified: Secondary | ICD-10-CM | POA: Insufficient documentation

## 2019-12-01 DIAGNOSIS — X789XXA Intentional self-harm by unspecified sharp object, initial encounter: Secondary | ICD-10-CM | POA: Diagnosis present

## 2019-12-01 DIAGNOSIS — Y908 Blood alcohol level of 240 mg/100 ml or more: Secondary | ICD-10-CM | POA: Insufficient documentation

## 2019-12-01 DIAGNOSIS — F1210 Cannabis abuse, uncomplicated: Secondary | ICD-10-CM | POA: Insufficient documentation

## 2019-12-01 DIAGNOSIS — T148XXA Other injury of unspecified body region, initial encounter: Secondary | ICD-10-CM

## 2019-12-01 DIAGNOSIS — F10220 Alcohol dependence with intoxication, uncomplicated: Secondary | ICD-10-CM | POA: Diagnosis present

## 2019-12-01 DIAGNOSIS — Z20822 Contact with and (suspected) exposure to covid-19: Secondary | ICD-10-CM | POA: Insufficient documentation

## 2019-12-01 DIAGNOSIS — Y92009 Unspecified place in unspecified non-institutional (private) residence as the place of occurrence of the external cause: Secondary | ICD-10-CM | POA: Diagnosis present

## 2019-12-01 DIAGNOSIS — F10920 Alcohol use, unspecified with intoxication, uncomplicated: Secondary | ICD-10-CM

## 2019-12-01 DIAGNOSIS — F1020 Alcohol dependence, uncomplicated: Secondary | ICD-10-CM | POA: Diagnosis present

## 2019-12-01 DIAGNOSIS — F1014 Alcohol abuse with alcohol-induced mood disorder: Secondary | ICD-10-CM | POA: Diagnosis present

## 2019-12-01 DIAGNOSIS — S50812A Abrasion of left forearm, initial encounter: Secondary | ICD-10-CM | POA: Insufficient documentation

## 2019-12-01 DIAGNOSIS — Y9389 Activity, other specified: Secondary | ICD-10-CM | POA: Insufficient documentation

## 2019-12-01 DIAGNOSIS — R4588 Nonsuicidal self-harm: Secondary | ICD-10-CM | POA: Diagnosis present

## 2019-12-01 DIAGNOSIS — X788XXA Intentional self-harm by other sharp object, initial encounter: Secondary | ICD-10-CM | POA: Insufficient documentation

## 2019-12-01 DIAGNOSIS — F331 Major depressive disorder, recurrent, moderate: Secondary | ICD-10-CM | POA: Insufficient documentation

## 2019-12-01 DIAGNOSIS — Y929 Unspecified place or not applicable: Secondary | ICD-10-CM | POA: Insufficient documentation

## 2019-12-01 DIAGNOSIS — Y999 Unspecified external cause status: Secondary | ICD-10-CM | POA: Insufficient documentation

## 2019-12-01 DIAGNOSIS — F605 Obsessive-compulsive personality disorder: Secondary | ICD-10-CM | POA: Insufficient documentation

## 2019-12-01 DIAGNOSIS — R45851 Suicidal ideations: Secondary | ICD-10-CM | POA: Insufficient documentation

## 2019-12-01 LAB — URINE DRUG SCREEN, QUALITATIVE (ARMC ONLY)
Amphetamines, Ur Screen: NOT DETECTED
Barbiturates, Ur Screen: NOT DETECTED
Benzodiazepine, Ur Scrn: NOT DETECTED
Cannabinoid 50 Ng, Ur ~~LOC~~: POSITIVE — AB
Cocaine Metabolite,Ur ~~LOC~~: NOT DETECTED
MDMA (Ecstasy)Ur Screen: NOT DETECTED
Methadone Scn, Ur: NOT DETECTED
Opiate, Ur Screen: NOT DETECTED
Phencyclidine (PCP) Ur S: NOT DETECTED
Tricyclic, Ur Screen: NOT DETECTED

## 2019-12-01 LAB — COMPREHENSIVE METABOLIC PANEL
ALT: 13 U/L (ref 0–44)
AST: 15 U/L (ref 15–41)
Albumin: 4.2 g/dL (ref 3.5–5.0)
Alkaline Phosphatase: 66 U/L (ref 38–126)
Anion gap: 13 (ref 5–15)
BUN: 11 mg/dL (ref 6–20)
CO2: 22 mmol/L (ref 22–32)
Calcium: 8.9 mg/dL (ref 8.9–10.3)
Chloride: 108 mmol/L (ref 98–111)
Creatinine, Ser: 0.99 mg/dL (ref 0.61–1.24)
GFR calc Af Amer: >60 mL/min (ref >60)
GFR calc non Af Amer: >60 mL/min (ref >60)
Glucose, Bld: 114 mg/dL — ABNORMAL HIGH (ref 70–99)
Potassium: 3.1 mmol/L — ABNORMAL LOW (ref 3.5–5.1)
Sodium: 143 mmol/L (ref 135–145)
Total Bilirubin: 0.7 mg/dL (ref 0.3–1.2)
Total Protein: 7 g/dL (ref 6.5–8.1)

## 2019-12-01 LAB — CBC
HCT: 41.8 % (ref 39.0–52.0)
Hemoglobin: 14 g/dL (ref 13.0–17.0)
MCH: 29.5 pg (ref 26.0–34.0)
MCHC: 33.5 g/dL (ref 30.0–36.0)
MCV: 88.2 fL (ref 80.0–100.0)
Platelets: 203 10*3/uL (ref 150–400)
RBC: 4.74 MIL/uL (ref 4.22–5.81)
RDW: 14.8 % (ref 11.5–15.5)
WBC: 5.8 10*3/uL (ref 4.0–10.5)
nRBC: 0 % (ref 0.0–0.2)

## 2019-12-01 LAB — ETHANOL: Alcohol, Ethyl (B): 323 mg/dL (ref <10)

## 2019-12-01 MED ORDER — THIAMINE HCL 100 MG PO TABS: 100.0000 mg | ORAL_TABLET | Freq: Every day | ORAL | Status: DC

## 2019-12-01 MED ORDER — LORAZEPAM 2 MG PO TABS
0.0000 mg | ORAL_TABLET | Freq: Four times a day (QID) | ORAL | Status: DC
  Administered 2019-12-01: 2 mg via ORAL
  Filled 2019-12-01: qty 1

## 2019-12-01 MED ORDER — THIAMINE HCL 100 MG/ML IJ SOLN: Freq: Once | INTRAVENOUS | Status: DC

## 2019-12-01 MED ORDER — POTASSIUM CHLORIDE CRYS ER 20 MEQ PO TBCR
40.0000 meq | EXTENDED_RELEASE_TABLET | Freq: Once | ORAL | Status: AC
  Administered 2019-12-01: 40 meq via ORAL
  Filled 2019-12-01: qty 2

## 2019-12-01 NOTE — ED Provider Notes (Signed)
[facility] Emergency Department Provider Note   ____________________________________________   First MD Initiated Contact with Patient 12/01/19 2328     (approximate)  I have reviewed the triage vital signs and the nursing notes.   HISTORY  Chief Complaint IVC  Level V caveat: Limited by intoxication  HPI Miguel [name] is a 59 y.o. male brought to the ED under IVC for cutting himself and voicing suicidal ideations.  Patient is well-known to the emergency department.  He is heavily intoxicated, with his wife about his drinking and scraped himself along the dorsal left forearm with a screwdriver.  Tetanus is up-to-date.  Voices no complaints.  Denies HI/AH/VH.       Past Medical History:  Diagnosis Date  . Alcohol abuse   . Anxiety   . Asthma   . GERD (gastroesophageal reflux disease)   . Gout   . Hypertension   . Kidney stone   . OCD (obsessive compulsive disorder)   . Renal colic     Patient Active Problem List   Diagnosis Date Noted  . Moderate episode of recurrent major depressive disorder ([place])   . Health care maintenance 10/26/2019  . Right knee pain 10/26/2019  . Generalized anxiety disorder 10/14/2019  . MDD (major depressive disorder), recurrent severe, without psychosis ([place]) 07/27/2019  . Social anxiety disorder 07/27/2019  . Left-sided chest wall pain 06/14/2019  . Alcoholic cirrhosis of liver without ascites ([place]) 06/14/2019  . Alcohol abuse 06/14/2019  . AKI (acute kidney injury) ([place]) 05/27/2019  . Alcohol withdrawal ([place]) 05/07/2019  . Alcoholic intoxication without complication ([place])   . Sepsis ([place]) 03/08/2019  . Breast lump or mass 10/04/2018  . Sepsis secondary to UTI ([place]) 09/14/2018  . Asthma 07/07/2018  . GERD (gastroesophageal reflux disease) 07/07/2018  . OCD (obsessive compulsive disorder) 07/07/2018  . Self-inflicted laceration of wrist ([place]) 03/10/2018  . Leg hematoma 12/25/2017  . Suicide and self-inflicted  injury by cutting and piercing instrument ([place]) 03/10/2017  . Severe recurrent major 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 ([place]) 03/09/2017  . Substance induced mood disorder ([place]) 08/15/2016  . Involuntary commitment 08/15/2016  . Alcohol use disorder, severe, dependence ([place]) 02/05/2016  . Hypertension 12/05/2015  . Tachycardia 12/05/2015  . Gout 11/13/2015  . Chronic back pain 05/02/2015    Past Surgical History:  Procedure Laterality Date  . CHOLECYSTECTOMY  2012  . EXTRACORPOREAL SHOCK WAVE LITHOTRIPSY Left 01/12/2019   Procedure: EXTRACORPOREAL SHOCK WAVE LITHOTRIPSY (ESWL);  Surgeon: Billey Co, MD;  Location: [facility] ORS;  Service: Urology;  Laterality: Left;    Prior to Admission medications   Medication Sig Start Date End Date Taking? Authorizing Provider  allopurinol (ZYLOPRIM) 300 MG tablet Take 1 tablet (300 mg total) by mouth daily. 10/26/19   Iloabachie, Chioma E, NP  ascorbic acid (VITAMIN C) 500 MG tablet Take 1 tablet (500 mg total) by mouth daily. 08/01/19   Clapacs, Madie Reno, MD  ciprofloxacin (CIPRO) 500 MG tablet Take 1 tablet (500 mg total) by mouth 2 (two) times daily. 11/13/19   Carrie Mew, MD  citalopram (CELEXA) 20 MG tablet Take 1 tablet (20 mg total) by mouth daily. 08/01/19   Clapacs, Madie Reno, MD  cloNIDine (CATAPRES) 0.1 MG tablet Take 1 tablet (0.1 mg total) by mouth daily. 10/26/19   Iloabachie, Chioma E, NP  colchicine 0.6 MG tablet Take 1 tablet (0.6 mg total) by mouth daily as needed. 08/01/19   Clapacs, Madie Reno, MD  cyclobenzaprine (FLEXERIL) 5 MG  tablet Take 1 tablet (5 mg total) by mouth at bedtime. 09/23/19   Menshew, Dannielle Karvonen, PA-C  Fluticasone-Salmeterol (ADVAIR DISKUS) 100-50 MCG/DOSE AEPB Inhale 1 puff into the lungs 2 (two) times daily. 09/01/18   Tukov-Yual, Arlyss Gandy, NP  folic acid (FOLVITE) 1 MG tablet Take 1 tablet (1 mg total) by mouth daily. 10/26/19   Iloabachie, Chioma E, NP  hydrOXYzine (ATARAX/VISTARIL) 25 MG tablet Take 1  tablet (25 mg total) by mouth 3 (three) times daily as needed for anxiety. 08/01/19   Clapacs, Madie Reno, MD  lisinopril (ZESTRIL) 40 MG tablet Take 1 tablet (40 mg total) by mouth daily. 10/26/19   Iloabachie, Chioma E, NP  metroNIDAZOLE (FLAGYL) 500 MG tablet Take 1 tablet (500 mg total) by mouth 3 (three) times daily. 11/13/19   Carrie Mew, MD  Multiple Vitamins-Iron (MULTIVITAMINS WITH IRON) TABS tablet Take 1 tablet by mouth daily. 08/01/19   Clapacs, Madie Reno, MD  naproxen (NAPROSYN) 500 MG tablet Take 1 tablet (500 mg total) by mouth 2 (two) times daily with a meal. 11/13/19   Carrie Mew, MD  ondansetron (ZOFRAN ODT) 4 MG disintegrating tablet Take 1 tablet (4 mg total) by mouth every 8 (eight) hours as needed for nausea or vomiting. 11/13/19   Carrie Mew, MD  pantoprazole (PROTONIX) 40 MG tablet Take 1 tablet (40 mg total) by mouth daily. 10/26/19   Iloabachie, Chioma E, NP  QUEtiapine (SEROQUEL) 50 MG tablet Take 1 tablet (50 mg total) by mouth at bedtime. 10/14/19   Patrecia Pour, NP  thiamine 250 MG tablet Take 1 tablet (250 mg total) by mouth daily. 08/01/19   Clapacs, Madie Reno, MD    Allergies Percocet [oxycodone-acetaminophen]  Family History  Problem Relation Age of Onset  . Alcohol abuse Father   . Breast cancer Mother 22    Social History Social History   Tobacco Use  . Smoking status: Former Smoker    Types: Cigarettes  . Smokeless tobacco: Never Used  . Tobacco comment: quit 30 years ago  Substance Use Topics  . Alcohol use: Yes    Alcohol/week: 12.0 standard drinks    Types: 12 Shots of liquor per week    Comment: pint daily  . Drug use: No    Review of Systems  Constitutional: No fever/chills Eyes: No visual changes. ENT: No sore throat. Cardiovascular: Denies chest pain. Respiratory: Denies shortness of breath. Gastrointestinal: No abdominal pain.  No nausea, no vomiting.  No diarrhea.  No constipation. Genitourinary: Negative for  dysuria. Musculoskeletal: Abrasion to left forearm.  Negative for back pain. Skin: Negative for rash. Neurological: Negative for headaches, focal weakness or numbness. Psychiatric:  Positive for depression with suicidal ideation and self-inflicted wound.  ____________________________________________   PHYSICAL EXAM:  VITAL SIGNS: ED Triage Vitals  Enc Vitals Group     BP 12/01/19 2212 (!) 150/87     Pulse Rate 12/01/19 2212 (!) 102     Resp 12/01/19 2212 18     Temp 12/01/19 2212 98.4 F (36.9 C)     Temp Source 12/01/19 2212 Oral     SpO2 12/01/19 2212 97 %     Weight 12/01/19 2213 190 lb (86.2 kg)     Height 12/01/19 2213 6' (1.829 m)     Head Circumference --      Peak Flow --      Pain Score 12/01/19 2212 0     Pain Loc --      Pain Edu? --  Excl. in [place]? --     Constitutional: Alert and oriented.  Disheveled appearing and in no acute distress. Eyes: Conjunctivae are normal. PERRL. EOMI. Head: Atraumatic. Nose: No congestion/rhinnorhea. Mouth/Throat: Mucous membranes are moist.  Oropharynx non-erythematous. Neck: No stridor.   Cardiovascular: Normal rate, regular rhythm. Grossly normal heart sounds.  Good peripheral circulation. Respiratory: Normal respiratory effort.  No retractions. Lungs CTAB. Gastrointestinal: Soft and nontender. No distention. No abdominal bruits. No CVA tenderness. Musculoskeletal: Approximately 10 cm superficial abrasion to posterior left forearm without bleeding.  2+ radial pulses.  Brisk, less than 5-second capillary refill.  No lower extremity tenderness nor edema.  No joint effusions. Neurologic:  Normal speech and language. No gross focal neurologic deficits are appreciated. No gait instability. Skin:  Skin is warm, dry and intact. No rash noted. Psychiatric: Intoxicated.  Mood and affect are normal. Speech and behavior are normal.  ____________________________________________   LABS (all labs ordered are listed, but only abnormal  results are displayed)  Labs Reviewed  COMPREHENSIVE METABOLIC PANEL - Abnormal; Notable for the following components:      Result Value   Potassium 3.1 (*)    Glucose, Bld 114 (*)    All other components within normal limits  ETHANOL - Abnormal; Notable for the following components:   Alcohol, Ethyl (B) 323 (*)    All other components within normal limits  URINE DRUG SCREEN, QUALITATIVE ([facility] ONLY) - Abnormal; Notable for the following components:   Cannabinoid 50 Ng, Ur [place] POSITIVE (*)    All other components within normal limits  CBC   ____________________________________________  EKG  None ____________________________________________  RADIOLOGY  ED MD interpretation: None  Official radiology report(s): No results found.  ____________________________________________   PROCEDURES  Procedure(s) performed (including Critical Care):  Procedures   ____________________________________________   INITIAL IMPRESSION / ASSESSMENT AND PLAN / ED COURSE  As part of my medical decision making, I reviewed the following data within the [facility] notes reviewed and incorporated, Labs reviewed, Old chart reviewed, A consult was requested and obtained from this/these consultant(s) Psychiatry and Notes from prior ED visits     JAQUON ONATE was evaluated in Emergency Department on 12/02/2019 for the symptoms described in the history of present illness. He was evaluated in the context of the global COVID-19 pandemic, which necessitated consideration that the patient might be at risk for infection with the SARS-CoV-2 virus that causes COVID-19. Institutional protocols and algorithms that pertain to the evaluation of patients at risk for COVID-19 are in a state of rapid change based on information released by regulatory bodies including the CDC and federal and state organizations. These policies and algorithms were followed during the patient's care in the ED.      59 year old male who is well-known to the emergency department for alcohol intoxication with suicidal gesture who presents under IVC with self-inflicted wound.  Will maintain IVC pending psychiatric evaluation and disposition.  Placed on CIWA scale.  Initiate banana bag, oral potassium replacement and reassess.   Clinical Course as of Dec 01 629  Sat Dec 02, 2019  0631 No further events overnight.  Patient remains in the ED under IVC pending psychiatric disposition.   [JS]    Clinical Course User Index [JS] Paulette Blanch, MD     ____________________________________________   FINAL CLINICAL IMPRESSION(S) / ED DIAGNOSES  Final diagnoses:  Alcoholic intoxication without complication (HCC)  Hypokalemia  Moderate episode of recurrent major depressive disorder ([place])  Abrasion  Marijuana abuse     ED Discharge Orders    None       Note:  This document was prepared using Dragon voice recognition software and may include unintentional dictation errors.   Paulette Blanch, MD 12/02/19 [phone]

## 2019-12-01 NOTE — ED Triage Notes (Signed)
Pt to the er under IVC for cutting himself on the left forearm posterior with a screwdriver and across his abd he has multiple superficial lacs. No sutures required. Pt was fighting with wife about his drinking. Pt states he drinks 1 pint a day.

## 2019-12-02 DIAGNOSIS — F10920 Alcohol use, unspecified with intoxication, uncomplicated: Secondary | ICD-10-CM

## 2019-12-02 DIAGNOSIS — F331 Major depressive disorder, recurrent, moderate: Secondary | ICD-10-CM | POA: Insufficient documentation

## 2019-12-02 DIAGNOSIS — F1014 Alcohol abuse with alcohol-induced mood disorder: Secondary | ICD-10-CM | POA: Diagnosis present

## 2019-12-02 DIAGNOSIS — X789XXA Intentional self-harm by unspecified sharp object, initial encounter: Secondary | ICD-10-CM

## 2019-12-02 LAB — RESPIRATORY PANEL BY RT PCR (FLU A&B, COVID)
Influenza A by PCR: NEGATIVE
Influenza B by PCR: NEGATIVE
SARS Coronavirus 2 by RT PCR: NEGATIVE

## 2019-12-02 MED ORDER — THIAMINE HCL 100 MG/ML IJ SOLN
100.0000 mg | Freq: Once | INTRAMUSCULAR | Status: AC
  Administered 2019-12-02: 01:00:00 100 mg via INTRAVENOUS
  Filled 2019-12-02: qty 2

## 2019-12-02 MED ORDER — ADULT MULTIVITAMIN W/MINERALS CH
1.0000 | ORAL_TABLET | Freq: Every day | ORAL | Status: DC
  Filled 2019-12-02: qty 1

## 2019-12-02 MED ORDER — FOLIC ACID 1 MG PO TABS
1.0000 mg | ORAL_TABLET | Freq: Every day | ORAL | Status: DC
  Filled 2019-12-02: qty 1

## 2019-12-02 MED ORDER — SODIUM CHLORIDE 0.9 % IV SOLN: Freq: Once | INTRAVENOUS | Status: AC

## 2019-12-02 NOTE — ED Notes (Signed)
ED BHU  Is the patient under IVC or is there intent for IVC: Yes.   Is the patient medically cleared: Yes.   Is there vacancy in the ED BHU: Yes.   Is the population mix appropriate for patient: Yes.   Is the patient awaiting placement in inpatient or outpatient setting:  Has the patient had a psychiatric consult: psych consult pending Survey of unit performed for contraband, proper placement and condition of furniture, tampering with fixtures in bathroom, shower, and each patient room: Yes.  ; Findings:  APPEARANCE/BEHAVIOR Calm and cooperative NEURO ASSESSMENT Orientation: oriented x3  Denies pain Hallucinations: No.None noted (Hallucinations)  Denies  Speech: Normal Gait: normal RESPIRATORY ASSESSMENT Even  Unlabored respirations  CARDIOVASCULAR ASSESSMENT Pulses equal   regular rate  Skin warm and dry   GASTROINTESTINAL ASSESSMENT no GI complaint EXTREMITIES Full ROM  PLAN OF CARE Provide calm/safe environment. Vital signs assessed twice daily. ED BHU Assessment once each 12-hour shift.  Assure the ED provider has rounded once each shift. Provide and encourage hygiene. Provide redirection as needed. Assess for escalating behavior; address immediately and inform ED provider.  Assess family dynamic and appropriateness for visitation as needed: Yes.  ; If necessary, describe findings:  Educate the patient/family about BHU procedures/visitation: Yes.  ; If necessary, describe findings:

## 2019-12-02 NOTE — BH Assessment (Signed)
Assessment Note  Miguel [name] is an 59 y.o. male presenting to [facility] ED under IVC due to patient cutting himself on his left arm with a screwdriver and across his abdomen. Per triage note patient was fighting with his wife about his drinking and patient reported that he drinks 1 pint of liquor a day. During assessment patient was pleasant cooperative, alert and oriented x4. Patient reported that he does currently drink 1 pint of liquor daily and reports "I just got lost in it." Patient reports currently feeling shaky due to alcohol withdrawal. Patient reports that he does not currently have a therapist or a psychiatrist but is open to receiving outpatient treatment on a individual basis. Patient denies depression he reports "sometimes I feel depressed." Patient denies SI/HI/AH/VH patient did not appear to be responding to any internal or external stimuli.  Per Psyc NP patient will be observed overnight and reassessed in the morning for potential discharge. Patient was provided with [facility] outpatient resource for follow-up.  Diagnosis: Alcohol Use Disorder Severe   Past Medical History:  Past Medical History:  Diagnosis Date  . Alcohol abuse   . Anxiety   . Asthma   . GERD (gastroesophageal reflux disease)   . Gout   . Hypertension   . Kidney stone   . OCD (obsessive compulsive disorder)   . Renal colic     Past Surgical History:  Procedure Laterality Date  . CHOLECYSTECTOMY  2012  . EXTRACORPOREAL SHOCK WAVE LITHOTRIPSY Left 01/12/2019   Procedure: EXTRACORPOREAL SHOCK WAVE LITHOTRIPSY (ESWL);  Surgeon: Billey Co, MD;  Location: [facility] ORS;  Service: Urology;  Laterality: Left;    Family History:  Family History  Problem Relation Age of Onset  . Alcohol abuse Father   . Breast cancer Mother 33    Social History:  reports that he has quit smoking. His smoking use included cigarettes. He has never used smokeless tobacco. He reports current alcohol use of about 12.0 standard drinks of  alcohol per week. He reports that he does not use drugs.  Additional Social History:  Alcohol / Drug Use Pain Medications: See MAR Prescriptions: See MAR Over the Counter: See MAR History of alcohol / drug use?: Yes Substance #1 Name of Substance 1: Alcohol 1 - Age of First Use: 13 1 - Amount (size/oz): "1 pint" 1 - Frequency: daily 1 - Duration: 45 years 1 - Last Use / Amount: 12/01/19  CIWA: CIWA-Ar BP: (!) 150/87 Pulse Rate: (!) 102 Nausea and Vomiting: no nausea and no vomiting Tactile Disturbances: none Tremor: moderate, with patient's arms extended Auditory Disturbances: not present Paroxysmal Sweats: barely perceptible sweating, palms moist Visual Disturbances: not present Anxiety: moderately anxious, or guarded, so anxiety is inferred Headache, Fullness in Head: none present Agitation: somewhat more than normal activity Orientation and Clouding of Sensorium: cannot do serial additions or is uncertain about date CIWA-Ar Total: 11 COWS:    Allergies:  Allergies  Allergen Reactions  . Percocet [Oxycodone-Acetaminophen] Nausea And Vomiting    Home Medications: (Not in a hospital admission)   OB/GYN Status:  No LMP for male patient.  General Assessment Data Location of Assessment: [facility] ED TTS Assessment: In system Is this a Tele or Face-to-Face Assessment?: Face-to-Face Is this an Initial Assessment or a Re-assessment for this encounter?: Initial Assessment Patient Accompanied by:: N/A Language Other than English: No Living Arrangements: Other (Comment)(Private Residence with wife) What gender do you identify as?: Male Marital status: Married Living Arrangements: Spouse/significant other Can pt  return to current living arrangement?: Yes Admission Status: Voluntary Is patient capable of signing voluntary admission?: Yes Referral Source: Self/Family/Friend Insurance type: None  Medical Screening Exam ([place]) Medical Exam completed: Yes  Crisis  Care Plan Living Arrangements: Spouse/significant other Legal Guardian: Other:(Self) Name of Psychiatrist: None Name of Therapist: None  Education Status Is patient currently in school?: No Is the patient employed, unemployed or receiving disability?: Unemployed  Risk to self with the past 6 months Suicidal Ideation: No Has patient been a risk to self within the past 6 months prior to admission? : No Suicidal Intent: No Has patient had any suicidal intent within the past 6 months prior to admission? : No Is patient at risk for suicide?: No Suicidal Plan?: No Has patient had any suicidal plan within the past 6 months prior to admission? : No Access to Means: No What has been your use of drugs/alcohol within the last 12 months?: Alcohol Abuse Previous Attempts/Gestures: No Triggers for Past Attempts: None known Intentional Self Injurious Behavior: None Family Suicide History: No Recent stressful life event(s): Other (Comment)(None reported) Persecutory voices/beliefs?: No Depression: No Substance abuse history and/or treatment for substance abuse?: Yes Suicide prevention information given to non-admitted patients: Yes  Risk to Others within the past 6 months Homicidal Ideation: No Does patient have any lifetime risk of violence toward others beyond the six months prior to admission? : No Thoughts of Harm to Others: No Current Homicidal Intent: No Current Homicidal Plan: No Access to Homicidal Means: No History of harm to others?: No Assessment of Violence: None Noted Does patient have access to weapons?: No Criminal Charges Pending?: No Does patient have a court date: No Is patient on probation?: No  Psychosis Hallucinations: None noted Delusions: None noted  Mental Status Report Appearance/Hygiene: In scrubs Eye Contact: Good Motor Activity: Freedom of movement Speech: Logical/coherent Level of Consciousness: Alert Mood: Pleasant Affect: Appropriate to  circumstance Anxiety Level: Minimal Thought Processes: Coherent Judgement: Unimpaired Orientation: Person, Time, Place, Appropriate for developmental age, Situation Obsessive Compulsive Thoughts/Behaviors: None  Cognitive Functioning Concentration: Normal Memory: Recent Intact, Remote Intact Is patient IDD: No Insight: Good Impulse Control: Good Appetite: Fair Have you had any weight changes? : No Change Sleep: No Change Total Hours of Sleep: 8 Vegetative Symptoms: None  ADLScreening [facility] Assessment Services) Patient's cognitive ability adequate to safely complete daily activities?: Yes Patient able to express need for assistance with ADLs?: Yes Independently performs ADLs?: Yes (appropriate for developmental age)  Prior Inpatient Therapy Prior Inpatient Therapy: No  Prior Outpatient Therapy Prior Outpatient Therapy: No Does patient have an ACCT team?: No Does patient have Intensive In-House Services?  : No Does patient have [facility] services? : No Does patient have P4CC services?: No  ADL Screening (condition at time of admission) Patient's cognitive ability adequate to safely complete daily activities?: Yes Is the patient deaf or have difficulty hearing?: No Does the patient have difficulty seeing, even when wearing glasses/contacts?: No Does the patient have difficulty concentrating, remembering, or making decisions?: No Patient able to express need for assistance with ADLs?: Yes Does the patient have difficulty dressing or bathing?: No Independently performs ADLs?: Yes (appropriate for developmental age) Does the patient have difficulty walking or climbing stairs?: No Weakness of Legs: None Weakness of Arms/Hands: None  Home Assistive Devices/Equipment Home Assistive Devices/Equipment: None  Therapy Consults (therapy consults require a physician order) PT Evaluation Needed: No OT Evalulation Needed: No SLP Evaluation Needed: No Abuse/Neglect Assessment  (Assessment to be  complete while patient is alone) Abuse/Neglect Assessment Can Be Completed: Yes Physical Abuse: Denies Verbal Abuse: Denies Sexual Abuse: Denies Exploitation of patient/patient's resources: Denies Self-Neglect: Denies Values / Beliefs Cultural Requests During Hospitalization: None Spiritual Requests During Hospitalization: None Consults Spiritual Care Consult Needed: No Transition of Care Team Consult Needed: No Advance Directives (For Healthcare) Does Patient Have a Medical Advance Directive?: No          Disposition: Per Psyc NP patient will be observed overnight and reassessed in the morning for potential discharge. Patient was provided with [place] outpatient resource for follow-up. Disposition Initial Assessment Completed for this Encounter: Yes  On Site Evaluation by:   Reviewed with Physician:    Leonie Douglas MS LCAS-A 12/02/2019 2:57 AM

## 2019-12-02 NOTE — ED Notes (Signed)
BEHAVIORAL HEALTH ROUNDING Patient sleeping: No. Patient alert and oriented: yes Behavior appropriate: Yes.  ; If no, describe:  Nutrition and fluids offered: yes Toileting and hygiene offered: Yes  Sitter present: q15 minute observations and security camera monitoring   ENVIRONMENTAL ASSESSMENT Potentially harmful objects out of patient reach: Yes.   Personal belongings secured: Yes.   Patient dressed in hospital provided attire only: Yes.   Plastic bags out of patient reach: Yes.   Patient care equipment (cords, cables, call bells, lines, and drains) shortened, removed, or accounted for: Yes.   Equipment and supplies removed from bottom of stretcher: Yes.   Potentially toxic materials out of patient reach: Yes.   Sharps container removed or out of patient reach: Yes.    

## 2019-12-02 NOTE — Consult Note (Signed)
[facility] Consult   Reason for Consult:  Psych Evaluation Referring Physician:  Dr. Beather Arbour Patient Identification: Miguel [name] MRN:  [id number] Principal Diagnosis: <principal problem not specified> Diagnosis:  Active Problems:   Suicide and self-inflicted injury by cutting and piercing instrument ([place])   Total Time spent with patient: 1 hour  Subjective:   Miguel [name] is a 59 y.o. male patient admitted with self inflicted injuries with intent to self harm.  "No one made me do it, I just did it, my dad died a few months ago and I was a lil drunk listening to [employer]". Per er-nurse Pt to the er under IVC for cutting himself on the left forearm posterior with a screwdriver and across his abd he has multiple superficial lacs. No sutures required. Pt was fighting with wife about his drinking. Pt states he drinks 1 pint a day.   HPI per edp: Miguel [name] is a 59 y.o. male brought to the ED under IVC for cutting himself and voicing suicidal ideations.  Patient is well-known to the emergency department.  He is heavily intoxicated, with his wife about his drinking and scraped himself along the dorsal left forearm with a screwdriver.  Tetanus is up-to-date.  Voices no complaints.  Denies HI/AH/VH.   HPI: Miguel [name], 59 y.o., male patient seen face to face by this provider, Dr. Beather Arbour; and chart reviewed on 12/02/19.  On evaluation Miguel [name] reports that he cut himself outside with a screw driver. He says its because his dad just died a  Few months ago and he was not able to go to the funeral and that he was a little drunk. He denies being suicidal or wanting to hurt himself.  Patient BAL is 375, writer recommends overnight observation for alcohol withdrawals and then reassessed in the am then discharged.  During evaluation Miguel [name] is alert/oriented x 4; calm/cooperative; and mood is congruent with affect.  He does not appear to be responding to internal/external stimuli or  delusional thoughts.  Patient denies suicidal/self-harm/homicidal ideation, psychosis, and paranoia.  Patient answered question appropriately.       Past Psychiatric History: yes  Risk to Self:   Risk to Others:   Prior Inpatient Therapy:   Prior Outpatient Therapy:    Past Medical History:  Past Medical History:  Diagnosis Date  . Alcohol abuse   . Anxiety   . Asthma   . GERD (gastroesophageal reflux disease)   . Gout   . Hypertension   . Kidney stone   . OCD (obsessive compulsive disorder)   . Renal colic     Past Surgical History:  Procedure Laterality Date  . CHOLECYSTECTOMY  2012  . EXTRACORPOREAL SHOCK WAVE LITHOTRIPSY Left 01/12/2019   Procedure: EXTRACORPOREAL SHOCK WAVE LITHOTRIPSY (ESWL);  Surgeon: Billey Co, MD;  Location: [facility] ORS;  Service: Urology;  Laterality: Left;   Family History:  Family History  Problem Relation Age of Onset  . Alcohol abuse Father   . Breast cancer Mother 5   Family Psychiatric  History: unknown Social History:  Social History   Substance and Sexual Activity  Alcohol Use Yes  . Alcohol/week: 12.0 standard drinks  . Types: 12 Shots of liquor per week   Comment: pint daily     Social History   Substance and Sexual Activity  Drug Use No    Social History   Socioeconomic History  . Marital status: Married    Spouse  name: Not on file  . Number of children: Not on file  . Years of education: Not on file  . Highest education level: Not on file  Occupational History  . Not on file  Tobacco Use  . Smoking status: Former Smoker    Types: Cigarettes  . Smokeless tobacco: Never Used  . Tobacco comment: quit 30 years ago  Substance and Sexual Activity  . Alcohol use: Yes    Alcohol/week: 12.0 standard drinks    Types: 12 Shots of liquor per week    Comment: pint daily  . Drug use: No  . Sexual activity: Yes  Other Topics Concern  . Not on file  Social History Narrative   Lives at home with his wife and takes  care of his wife. Independent at baseline      - Biggest strain is financial but doesn't think they could got get more help.      Patient expressed interest in possible financial assistance. Informed written consent obtained   Social Determinants of Health   Financial Resource Strain:   . Difficulty of Paying Living Expenses: Not on file  Food Insecurity:   . Worried About Charity fundraiser in the Last Year: Not on file  . Ran Out of Food in the Last Year: Not on file  Transportation Needs:   . Lack of Transportation (Medical): Not on file  . Lack of Transportation (Non-Medical): Not on file  Physical Activity:   . Days of Exercise per Week: Not on file  . Minutes of Exercise per Session: Not on file  Stress:   . Feeling of Stress : Not on file  Social Connections:   . Frequency of Communication with Friends and Family: Not on file  . Frequency of Social Gatherings with Friends and Family: Not on file  . Attends Religious Services: Not on file  . Active Member of Clubs or Organizations: Not on file  . Attends Archivist Meetings: Not on file  . Marital Status: Not on file   Additional Social History:    Allergies:   Allergies  Allergen Reactions  . Percocet [Oxycodone-Acetaminophen] Nausea And Vomiting    Labs:  Results for orders placed or performed during the hospital encounter of 12/01/19 (from the past 48 hour(s))  Comprehensive metabolic panel     Status: Abnormal   Collection Time: 12/01/19 10:26 PM  Result Value Ref Range   Sodium 143 135 - 145 mmol/L   Potassium 3.1 (L) 3.5 - 5.1 mmol/L   Chloride 108 98 - 111 mmol/L   CO2 22 22 - 32 mmol/L   Glucose, Bld 114 (H) 70 - 99 mg/dL   BUN 11 6 - 20 mg/dL   Creatinine, Ser 0.99 0.61 - 1.24 mg/dL   Calcium 8.9 8.9 - 10.3 mg/dL   Total Protein 7.0 6.5 - 8.1 g/dL   Albumin 4.2 3.5 - 5.0 g/dL   AST 15 15 - 41 U/L   ALT 13 0 - 44 U/L   Alkaline Phosphatase 66 38 - 126 U/L   Total Bilirubin 0.7 0.3 - 1.2  mg/dL   GFR calc non Af Amer >60 >60 mL/min   GFR calc Af Amer >60 >60 mL/min   Anion gap 13 5 - 15    Comment: Performed at [facility], [address], [place], [place] [address]  Ethanol     Status: Abnormal   Collection Time: 12/01/19 10:26 PM  Result Value Ref Range  Alcohol, Ethyl (B) 323 (HH) <10 mg/dL    Comment: CRITICAL RESULT CALLED TO, READ BACK BY AND VERIFIED WITH PAIGE JOHNSON @2310  12/01/19 MJU (NOTE) Lowest detectable limit for serum alcohol is 10 mg/dL. For medical purposes only. Performed at [facility], [address] [place], [place] [address]   cbc     Status: None   Collection Time: 12/01/19 10:26 PM  Result Value Ref Range   WBC 5.8 4.0 - 10.5 K/uL   RBC 4.74 4.22 - 5.81 MIL/uL   Hemoglobin 14.0 13.0 - 17.0 g/dL   HCT 41.8 39.0 - 52.0 %   MCV 88.2 80.0 - 100.0 fL   MCH 29.5 26.0 - 34.0 pg   MCHC 33.5 30.0 - 36.0 g/dL   RDW 14.8 11.5 - 15.5 %   Platelets 203 150 - 400 K/uL   nRBC 0.0 0.0 - 0.2 %    Comment: Performed at [facility], [address] [place], [place] [address]  Urine Drug Screen, Qualitative     Status: Abnormal   Collection Time: 12/01/19 10:26 PM  Result Value Ref Range   Tricyclic, Ur Screen NONE DETECTED NONE DETECTED   Amphetamines, Ur Screen NONE DETECTED NONE DETECTED   MDMA (Ecstasy)Ur Screen NONE DETECTED NONE DETECTED   Cocaine Metabolite,Ur [place] NONE DETECTED NONE DETECTED   Opiate, Ur Screen NONE DETECTED NONE DETECTED   Phencyclidine (PCP) Ur S NONE DETECTED NONE DETECTED   Cannabinoid 50 Ng, Ur [place] POSITIVE (A) NONE DETECTED   Barbiturates, Ur Screen NONE DETECTED NONE DETECTED   Benzodiazepine, Ur Scrn NONE DETECTED NONE DETECTED   Methadone Scn, Ur NONE DETECTED NONE DETECTED    Comment: (NOTE) Tricyclics + metabolites, urine    Cutoff 1000 ng/mL Amphetamines + metabolites, urine  Cutoff 1000 ng/mL MDMA (Ecstasy), urine              Cutoff 500 ng/mL Cocaine Metabolite, urine           Cutoff 300 ng/mL Opiate + metabolites, urine        Cutoff 300 ng/mL Phencyclidine (PCP), urine         Cutoff 25 ng/mL Cannabinoid, urine                 Cutoff 50 ng/mL Barbiturates + metabolites, urine  Cutoff 200 ng/mL Benzodiazepine, urine              Cutoff 200 ng/mL Methadone, urine                   Cutoff 300 ng/mL The urine drug screen provides only a preliminary, unconfirmed analytical test result and should not be used for non-medical purposes. Clinical consideration and professional judgment should be applied to any positive drug screen result due to possible interfering substances. A more specific alternate chemical method must be used in order to obtain a confirmed analytical result. Gas chromatography / mass spectrometry (GC/MS) is the preferred confirmat ory method. Performed at [facility], [address] [place],  [address]     Current Facility-Administered Medications  Medication Dose Route Frequency Provider Last Rate Last Admin  . folic acid (FOLVITE) tablet 1 mg  1 mg Oral Daily Paulette Blanch, MD      . LORazepam (ATIVAN) tablet 0-4 mg  0-4 mg Oral Q6H Paulette Blanch, MD   2 mg at 12/01/19 2346  . multivitamin with minerals tablet 1 tablet  1 tablet Oral Daily Paulette Blanch, MD      .  thiamine tablet 100 mg  100 mg Oral Daily Paulette Blanch, MD       Current Outpatient Medications  Medication Sig Dispense Refill  . allopurinol (ZYLOPRIM) 300 MG tablet Take 1 tablet (300 mg total) by mouth daily. 30 tablet 1  . ascorbic acid (VITAMIN C) 500 MG tablet Take 1 tablet (500 mg total) by mouth daily. 30 tablet 1  . ciprofloxacin (CIPRO) 500 MG tablet Take 1 tablet (500 mg total) by mouth 2 (two) times daily. 14 tablet 0  . citalopram (CELEXA) 20 MG tablet Take 1 tablet (20 mg total) by mouth daily. 30 tablet 1  . cloNIDine (CATAPRES) 0.1 MG tablet Take 1 tablet (0.1 mg total) by mouth daily. 30 tablet 0  . colchicine 0.6 MG tablet Take 1 tablet (0.6 mg  total) by mouth daily as needed. 30 tablet 1  . cyclobenzaprine (FLEXERIL) 5 MG tablet Take 1 tablet (5 mg total) by mouth at bedtime. 30 tablet 0  . Fluticasone-Salmeterol (ADVAIR DISKUS) 100-50 MCG/DOSE AEPB Inhale 1 puff into the lungs 2 (two) times daily. 99991111 each 0  . folic acid (FOLVITE) 1 MG tablet Take 1 tablet (1 mg total) by mouth daily. 30 tablet 1  . hydrOXYzine (ATARAX/VISTARIL) 25 MG tablet Take 1 tablet (25 mg total) by mouth 3 (three) times daily as needed for anxiety. 60 tablet 1  . lisinopril (ZESTRIL) 40 MG tablet Take 1 tablet (40 mg total) by mouth daily. 30 tablet 1  . metroNIDAZOLE (FLAGYL) 500 MG tablet Take 1 tablet (500 mg total) by mouth 3 (three) times daily. 30 tablet 0  . Multiple Vitamins-Iron (MULTIVITAMINS WITH IRON) TABS tablet Take 1 tablet by mouth daily. 30 tablet 1  . naproxen (NAPROSYN) 500 MG tablet Take 1 tablet (500 mg total) by mouth 2 (two) times daily with a meal. 20 tablet 0  . ondansetron (ZOFRAN ODT) 4 MG disintegrating tablet Take 1 tablet (4 mg total) by mouth every 8 (eight) hours as needed for nausea or vomiting. 20 tablet 0  . pantoprazole (PROTONIX) 40 MG tablet Take 1 tablet (40 mg total) by mouth daily. 30 tablet 2  . QUEtiapine (SEROQUEL) 50 MG tablet Take 1 tablet (50 mg total) by mouth at bedtime. 60 tablet 1  . thiamine 250 MG tablet Take 1 tablet (250 mg total) by mouth daily. 30 tablet 1    Musculoskeletal: Strength & Muscle Tone: within normal limits Gait & Station: unsteady Patient leans: N/A  Psychiatric Specialty Exam: Physical Exam  Nursing note and vitals reviewed. Constitutional: He is oriented to person, place, and time. He appears well-developed.  Eyes: Pupils are equal, round, and reactive to light.  Respiratory: Effort normal.  GI: Soft.  Musculoskeletal:        General: Normal range of motion.     Cervical back: Normal range of motion.  Neurological: He is alert and oriented to person, place, and time.  Skin:  Skin is warm and dry.  Psychiatric: He has a normal mood and affect. His speech is normal and behavior is normal. Judgment and thought content normal. Cognition and memory are normal.    Review of Systems  Psychiatric/Behavioral: Negative for behavioral problems and suicidal ideas. The patient is not nervous/anxious.   All other systems reviewed and are negative.   Blood pressure (!) 150/87, pulse (!) 102, temperature 98.4 F (36.9 C), temperature source Oral, resp. rate 18, height 6' (1.829 m), weight 86.2 kg, SpO2 97 %.Body mass index is 25.77 kg/m.  General Appearance: Disheveled  Eye Contact:  Fair  Speech:  Clear and Coherent  Volume:  Normal  Mood:  Euthymic  Affect:  Appropriate  Thought Process:  Coherent and Descriptions of Associations: Intact  Orientation:  Full (Time, Place, and Person)  Thought Content:  WDL  Suicidal Thoughts:  No  Homicidal Thoughts:  No  Memory:  Immediate;   Fair  Judgement:  Impaired  Insight:  Fair  Psychomotor Activity:  Increased  Concentration:  Concentration: Good  Recall:  Good  Fund of Knowledge:  Good  Language:  Good  Akathisia:  NA  Handed:  Right  AIMS (if indicated):     Assets:  Communication Skills Desire for Improvement Resilience  ADL's:  Intact  Cognition:  WNL  Sleep:        Treatment Plan Summary: Plan overnight observation then reassessed in the am.  Disposition: No evidence of imminent risk to self or others at present.   Patient does not meet criteria for psychiatric inpatient admission. Discussed crisis plan, support from social network, calling 911, coming to the Emergency Department, and calling Suicide Hotline.  Deloria Lair, NP 12/02/2019 2:48 AM

## 2019-12-02 NOTE — ED Notes (Signed)
BEHAVIORAL HEALTH ROUNDING Patient sleeping: No. Patient alert and oriented: yes Behavior appropriate: Yes.  ; If no, describe:  Nutrition and fluids offered: yes Toileting and hygiene offered: Yes  Sitter present: q15 minute observations and security camera monitoring   

## 2019-12-02 NOTE — ED Notes (Signed)
Pt complaining of shaking. MD sung aware.

## 2019-12-02 NOTE — ED Notes (Signed)
Departure condition should be documented at 1140

## 2019-12-02 NOTE — Consult Note (Signed)
[facility] Psych ED Discharge  12/02/2019 11:39 AM Miguel [name]  MRN:  [id number] Principal Problem: Alcohol abuse with alcohol-induced mood disorder [facility]) Discharge Diagnoses: Principal Problem:   Alcohol abuse with alcohol-induced mood disorder ([place]) Active Problems:   Alcohol use disorder, severe, dependence ([place])   Suicide and self-inflicted injury by cutting and piercing instrument ([place])   Alcoholic intoxication without complication ([place])  Subjective: "I'm good."  Patient seen and evaluated in person by this provider.  He reports drinking too much last night and does not remember making threats to harm himself.  He is well-known to this ED and providers for similar presentations when he is drinking.  He continues to stress over taking care of his wife but unsure who is taking care of him in the house.  Denies suicidal ideation and sitting on the side of the bed this morning eating his breakfast.  Denies homicidal ideations and hallucinations along with withdrawal symptoms.  Recommended rehab but patient declined.  Patient is psychiatrically stable for discharge.  Miguel [name] is an 59 y.o. male presenting to [facility] ED under IVC due to patient cutting himself on his left arm with a screwdriver and across his abdomen. Per triage note patient was fighting with his wife about his drinking and patient reported that he drinks 1 pint of liquor a day. During assessment patient was pleasant cooperative, alert and oriented x4. Patient reported that he does currently drink 1 pint of liquor daily and reports "I just got lost in it." Patient reports currently feeling shaky due to alcohol withdrawal. Patient reports that he does not currently have a therapist or a psychiatrist but is open to receiving outpatient treatment on a individual basis. Patient denies depression he reports "sometimes I feel depressed." Patient denies SI/HI/AH/VH patient did not appear to be responding to any internal or external stimuli.  Total  Time spent with patient: 30 minutes  Past Psychiatric History: alcohol use disorder  Past Medical History:  Past Medical History:  Diagnosis Date  . Alcohol abuse   . Anxiety   . Asthma   . GERD (gastroesophageal reflux disease)   . Gout   . Hypertension   . Kidney stone   . OCD (obsessive compulsive disorder)   . Renal colic     Past Surgical History:  Procedure Laterality Date  . CHOLECYSTECTOMY  2012  . EXTRACORPOREAL SHOCK WAVE LITHOTRIPSY Left 01/12/2019   Procedure: EXTRACORPOREAL SHOCK WAVE LITHOTRIPSY (ESWL);  Surgeon: Billey Co, MD;  Location: [facility] ORS;  Service: Urology;  Laterality: Left;   Family History:  Family History  Problem Relation Age of Onset  . Alcohol abuse Father   . Breast cancer Mother 67   Family Psychiatric  History: see above Social History:  Social History   Substance and Sexual Activity  Alcohol Use Yes  . Alcohol/week: 12.0 standard drinks  . Types: 12 Shots of liquor per week   Comment: pint daily     Social History   Substance and Sexual Activity  Drug Use No    Social History   Socioeconomic History  . Marital status: Married    Spouse name: Not on file  . Number of children: Not on file  . Years of education: Not on file  . Highest education level: Not on file  Occupational History  . Not on file  Tobacco Use  . Smoking status: Former Smoker    Types: Cigarettes  . Smokeless tobacco: Never Used  . Tobacco comment: quit  30 years ago  Substance and Sexual Activity  . Alcohol use: Yes    Alcohol/week: 12.0 standard drinks    Types: 12 Shots of liquor per week    Comment: pint daily  . Drug use: No  . Sexual activity: Yes  Other Topics Concern  . Not on file  Social History Narrative   Lives at home with his wife and takes care of his wife. Independent at baseline      - Biggest strain is financial but doesn't think they could got get more help.      Patient expressed interest in possible financial  assistance. Informed written consent obtained   Social Determinants of Health   Financial Resource Strain:   . Difficulty of Paying Living Expenses: Not on file  Food Insecurity:   . Worried About Charity fundraiser in the Last Year: Not on file  . Ran Out of Food in the Last Year: Not on file  Transportation Needs:   . Lack of Transportation (Medical): Not on file  . Lack of Transportation (Non-Medical): Not on file  Physical Activity:   . Days of Exercise per Week: Not on file  . Minutes of Exercise per Session: Not on file  Stress:   . Feeling of Stress : Not on file  Social Connections:   . Frequency of Communication with Friends and Family: Not on file  . Frequency of Social Gatherings with Friends and Family: Not on file  . Attends Religious Services: Not on file  . Active Member of Clubs or Organizations: Not on file  . Attends Archivist Meetings: Not on file  . Marital Status: Not on file    Has this patient used any form of tobacco in the last 30 days? (Cigarettes, Smokeless Tobacco, Cigars, and/or Pipes)  NA  Current Medications: Current Facility-Administered Medications  Medication Dose Route Frequency Provider Last Rate Last Admin  . folic acid (FOLVITE) tablet 1 mg  1 mg Oral Daily Paulette Blanch, MD   Stopped at 12/02/19 0300  . LORazepam (ATIVAN) tablet 0-4 mg  0-4 mg Oral Q6H Paulette Blanch, MD   2 mg at 12/01/19 2346  . multivitamin with minerals tablet 1 tablet  1 tablet Oral Daily Paulette Blanch, MD   Stopped at 12/02/19 0259  . thiamine tablet 100 mg  100 mg Oral Daily Paulette Blanch, MD       Current Outpatient Medications  Medication Sig Dispense Refill  . allopurinol (ZYLOPRIM) 300 MG tablet Take 1 tablet (300 mg total) by mouth daily. 30 tablet 1  . ascorbic acid (VITAMIN C) 500 MG tablet Take 1 tablet (500 mg total) by mouth daily. 30 tablet 1  . ciprofloxacin (CIPRO) 500 MG tablet Take 1 tablet (500 mg total) by mouth 2 (two) times daily. 14  tablet 0  . citalopram (CELEXA) 20 MG tablet Take 1 tablet (20 mg total) by mouth daily. 30 tablet 1  . cloNIDine (CATAPRES) 0.1 MG tablet Take 1 tablet (0.1 mg total) by mouth daily. 30 tablet 0  . colchicine 0.6 MG tablet Take 1 tablet (0.6 mg total) by mouth daily as needed. 30 tablet 1  . cyclobenzaprine (FLEXERIL) 5 MG tablet Take 1 tablet (5 mg total) by mouth at bedtime. 30 tablet 0  . Fluticasone-Salmeterol (ADVAIR DISKUS) 100-50 MCG/DOSE AEPB Inhale 1 puff into the lungs 2 (two) times daily. 99991111 each 0  . folic acid (FOLVITE) 1 MG tablet Take 1 tablet (  1 mg total) by mouth daily. 30 tablet 1  . hydrOXYzine (ATARAX/VISTARIL) 25 MG tablet Take 1 tablet (25 mg total) by mouth 3 (three) times daily as needed for anxiety. 60 tablet 1  . lisinopril (ZESTRIL) 40 MG tablet Take 1 tablet (40 mg total) by mouth daily. 30 tablet 1  . metroNIDAZOLE (FLAGYL) 500 MG tablet Take 1 tablet (500 mg total) by mouth 3 (three) times daily. 30 tablet 0  . Multiple Vitamins-Iron (MULTIVITAMINS WITH IRON) TABS tablet Take 1 tablet by mouth daily. 30 tablet 1  . naproxen (NAPROSYN) 500 MG tablet Take 1 tablet (500 mg total) by mouth 2 (two) times daily with a meal. 20 tablet 0  . ondansetron (ZOFRAN ODT) 4 MG disintegrating tablet Take 1 tablet (4 mg total) by mouth every 8 (eight) hours as needed for nausea or vomiting. 20 tablet 0  . pantoprazole (PROTONIX) 40 MG tablet Take 1 tablet (40 mg total) by mouth daily. 30 tablet 2  . QUEtiapine (SEROQUEL) 50 MG tablet Take 1 tablet (50 mg total) by mouth at bedtime. 60 tablet 1  . thiamine 250 MG tablet Take 1 tablet (250 mg total) by mouth daily. 30 tablet 1   PTA Medications: (Not in a hospital admission)   Musculoskeletal: Strength & Muscle Tone: within normal limits Gait & Station: normal Patient leans: N/A  Psychiatric Specialty Exam: Physical Exam Vitals and nursing note reviewed.  Constitutional:      Appearance: Normal appearance.  HENT:      Head: Normocephalic.     Nose: Nose normal.  Pulmonary:     Effort: Pulmonary effort is normal.  Musculoskeletal:     Cervical back: Normal range of motion.  Neurological:     General: No focal deficit present.     Mental Status: He is alert and oriented to person, place, and time.  Psychiatric:        Attention and Perception: Attention and perception normal.        Mood and Affect: Affect normal. Mood is anxious.        Speech: Speech normal.        Behavior: Behavior normal. Behavior is cooperative.        Thought Content: Thought content normal.        Cognition and Memory: Cognition and memory normal.        Judgment: Judgment normal.     Review of Systems  Psychiatric/Behavioral: The patient is nervous/anxious.   All other systems reviewed and are negative.   Blood pressure (!) 145/86, pulse 96, temperature 98.1 F (36.7 C), temperature source Oral, resp. rate 18, height 6' (1.829 m), weight 86.2 kg, SpO2 98 %.Body mass index is 25.77 kg/m.  General Appearance: Casual  Eye Contact:  Good  Speech:  Normal Rate  Volume:  Normal  Mood:  Anxious  Affect:  Congruent  Thought Process:  Coherent and Descriptions of Associations: Intact  Orientation:  Full (Time, Place, and Person)  Thought Content:  WDL and Logical  Suicidal Thoughts:  No  Homicidal Thoughts:  No  Memory:  Immediate;   Good Recent;   Good Remote;   Good  Judgement:  Fair  Insight:  Fair  Psychomotor Activity:  Decreased  Concentration:  Concentration: Fair and Attention Span: Fair  Recall:  [employer] of Knowledge:  Fair  Language:  Good  Akathisia:  No  Handed:  Right  AIMS (if indicated):     Assets:  Housing Leisure Time Physical Health  Resilience Social Support  ADL's:  Intact  Cognition:  WNL  Sleep:        Demographic Factors:  Male and Caucasian  Loss Factors: NA  Historical Factors: NA  Risk Reduction Factors:   Sense of responsibility to family, Living with another  person, especially a relative and Positive social support  Continued Clinical Symptoms:  Anxiety, mild  Cognitive Features That Contribute To Risk:  None    Suicide Risk:  Minimal: No identifiable suicidal ideation.  Patients presenting with no risk factors but with morbid ruminations; may be classified as minimal risk based on the severity of the depressive symptoms   Plan Of Care/Follow-up recommendations:  Alcohol abuse disorder: -Recommend rehab, patient declined  Depression: -Continue Celexa 20 mg daily  Anxiety: -Continue hydroxyzine 25 mg TID PRN  Insomnia: -Continue Seroquel 50 mg at bedtime call Other phone Disposition: discharge home Waylan Boga, NP 12/02/2019, 11:39 AM

## 2019-12-02 NOTE — ED Notes (Signed)
This RN spoke with [facility] for possible transfer to [facility]. Pt need to be COVID swab before transfer to [place]. This RN will speak to provider regarding COVID swab orders.

## 2019-12-02 NOTE — Discharge Instructions (Signed)
Follow up with outpatient provider    Living With Depression Everyone experiences occasional disappointment, sadness, and loss in their lives. When you are feeling down, blue, or sad for at least 2 weeks in a row, it may mean that you have depression. Depression can affect your thoughts and feelings, relationships, daily activities, and physical health. It is caused by changes in the way your brain functions. If you receive a diagnosis of depression, your health care provider will tell you which type of depression you have and what treatment options are available to you. If you are living with depression, there are ways to help you recover from it and also ways to prevent it from coming back. How to cope with lifestyle changes Coping with stress     Stress is your body's reaction to life changes and events, both good and bad. Stressful situations may include:  Getting married.  The death of a spouse.  Losing a job.  Retiring.  Having a baby. Stress can last just a few hours or it can be ongoing. Stress can play a major role in depression, so it is important to learn both how to cope with stress and how to think about it differently. Talk with your health care provider or a counselor if you would like to learn more about stress reduction. He or she may suggest some stress reduction techniques, such as:  Music therapy. This can include creating music or listening to music. Choose music that you enjoy and that inspires you.  Mindfulness-based meditation. This kind of meditation can be done while sitting or walking. It involves being aware of your normal breaths, rather than trying to control your breathing.  Centering prayer. This is a kind of meditation that involves focusing on a spiritual word or phrase. Choose a word, phrase, or sacred image that is meaningful to you and that brings you peace.  Deep breathing. To do this, expand your stomach and inhale slowly through your nose. Hold  your breath for 3-5 seconds, then exhale slowly, allowing your stomach muscles to relax.  Muscle relaxation. This involves intentionally tensing muscles then relaxing them. Choose a stress reduction technique that fits your lifestyle and personality. Stress reduction techniques take time and practice to develop. Set aside 5-15 minutes a day to do them. Therapists can offer training in these techniques. The training may be covered by some insurance plans. Other things you can do to manage stress include:  Keeping a stress diary. This can help you learn what triggers your stress and ways to control your response.  Understanding what your limits are and saying no to requests or events that lead to a schedule that is too full.  Thinking about how you respond to certain situations. You may not be able to control everything, but you can control how you react.  Adding humor to your life by watching funny films or TV shows.  Making time for activities that help you relax and not feeling guilty about spending your time this way.  Medicines Your health care provider may suggest certain medicines if he or she feels that they will help improve your condition. Avoid using alcohol and other substances that may prevent your medicines from working properly (may interact). It is also important to:  Talk with your pharmacist or health care provider about all the medicines that you take, their possible side effects, and what medicines are safe to take together.  Make it your goal to take part in all treatment  decisions (shared decision-making). This includes giving input on the side effects of medicines. It is best if shared decision-making with your health care provider is part of your total treatment plan. If your health care provider prescribes a medicine, you may not notice the full benefits of it for 4-8 weeks. Most people who are treated for depression need to be on medicine for at least 6-12 months after  they feel better. If you are taking medicines as part of your treatment, do not stop taking medicines without first talking to your health care provider. You may need to have the medicine slowly decreased (tapered) over time to decrease the risk of harmful side effects. Relationships Your health care provider may suggest family therapy along with individual therapy and drug therapy. While there may not be family problems that are causing you to feel depressed, it is still important to make sure your family learns as much as they can about your mental health. Having your family's support can help make your treatment successful. How to recognize changes in your condition Everyone has a different response to treatment for depression. Recovery from major depression happens when you have not had signs of major depression for two months. This may mean that you will start to:  Have more interest in doing activities.  Feel less hopeless than you did 2 months ago.  Have more energy.  Overeat less often, or have better or improving appetite.  Have better concentration. Your health care provider will work with you to decide the next steps in your recovery. It is also important to recognize when your condition is getting worse. Watch for these signs:  Having fatigue or low energy.  Eating too much or too little.  Sleeping too much or too little.  Feeling restless, agitated, or hopeless.  Having trouble concentrating or making decisions.  Having unexplained physical complaints.  Feeling irritable, angry, or aggressive. Get help as soon as you or your family members notice these symptoms coming back. How to get support and help from others How to talk with friends and family members about your condition  Talking to friends and family members about your condition can provide you with one way to get support and guidance. Reach out to trusted friends or family members, explain your symptoms to them,  and let them know that you are working with a health care provider to treat your depression. Financial resources Not all insurance plans cover mental health care, so it is important to check with your insurance carrier. If paying for co-pays or counseling services is a problem, search for a local or county mental health care center. They may be able to offer public mental health care services at low or no cost when you are not able to see a private health care provider. If you are taking medicine for depression, you may be able to get the generic form, which may be less expensive. Some makers of prescription medicines also offer help to patients who cannot afford the medicines they need. Follow these instructions at home:   Get the right amount and quality of sleep.  Cut down on using caffeine, tobacco, alcohol, and other potentially harmful substances.  Try to exercise, such as walking or lifting small weights.  Take over-the-counter and prescription medicines only as told by your health care provider.  Eat a healthy diet that includes plenty of vegetables, fruits, whole grains, low-fat dairy products, and lean protein. Do not eat a lot of foods that are  high in solid fats, added sugars, or salt.  Keep all follow-up visits as told by your health care provider. This is important. Contact a health care provider if:  You stop taking your antidepressant medicines, and you have any of these symptoms: ? Nausea. ? Headache. ? Feeling lightheaded. ? Chills and body aches. ? Not being able to sleep (insomnia).  You or your friends and family think your depression is getting worse. Get help right away if:  You have thoughts of hurting yourself or others. If you ever feel like you may hurt yourself or others, or have thoughts about taking your own life, get help right away. You can go to your nearest emergency department or call:  Your local emergency services (911 in the [place]  A suicide  crisis helpline, such as the [facility] at 707-124-2309. This is open 24-hours a day. Summary  If you are living with depression, there are ways to help you recover from it and also ways to prevent it from coming back.  Work with your health care team to create a management plan that includes counseling, stress management techniques, and healthy lifestyle habits. This information is not intended to replace advice given to you by your health care provider. Make sure you discuss any questions you have with your health care provider. Document Revised: 02/24/2019 Document Reviewed: 10/05/2016 Elsevier Patient Education  [place].

## 2019-12-02 NOTE — ED Notes (Signed)
Pt transferred into ED BHU room 5    Patient assigned to appropriate care area. Patient oriented to unit/care area: Informed that, for his safety, care areas are designed for safety and monitored by security cameras at all times; Visiting hours, covid restrictions and phone times explained to patient. Patient verbalizes understanding, and verbal contract for safety obtained. Assessment completed   He denies pain  Psych consult pending

## 2019-12-02 NOTE — ED Notes (Signed)
Pt transported to  [facility] by[facility] EDT [facility]. Pt sent with belongings. Pt Able to get on chair on his own. NAD noted.

## 2019-12-06 ENCOUNTER — Ambulatory Visit: Payer: Medicaid Other

## 2019-12-07 IMAGING — CT CT CHEST WITH CONTRAST
2 of 5 series · 13 of 36 positions shown, 16 images · IV contrast (omnipaque)
Comparison: Portable chest x-ray earlier today. Chest CT
08/18/2018. CT Abdomen and Pelvis 01/20/2019 and earlier.

CLINICAL DATA: 57-year-old male status post fall.

EXAM:
CT CHEST, ABDOMEN, AND PELVIS WITH CONTRAST
TECHNIQUE: Multidetector CT imaging of the chest, abdomen and pelvis was
performed following the standard protocol during bolus
administration of intravenous contrast.
CONTRAST:  100mL OMNIPAQUE IOHEXOL 300 MG/ML  SOLN

[Series 2: cap with · axial · 0.82mm/px · z∈[-1043,-503]mm · 10 of 134 slices shown, 13 images]
[im 13/134  mediastinal]
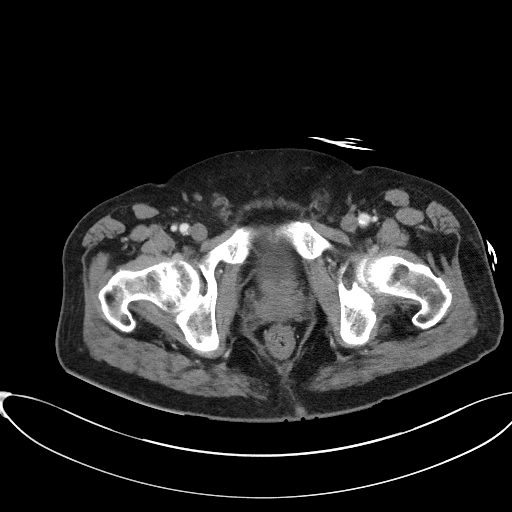
[im 13/134  lung]
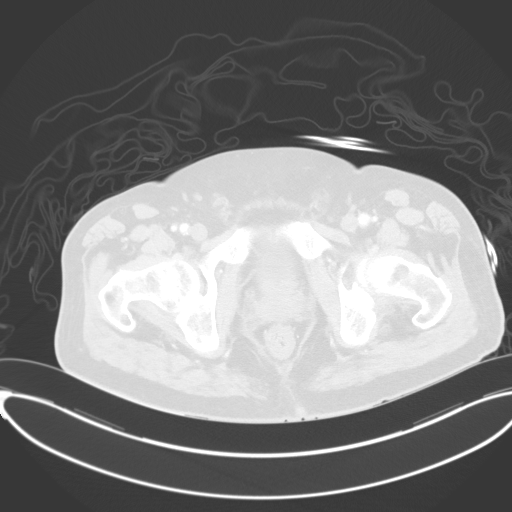
[im 25/134  lung]
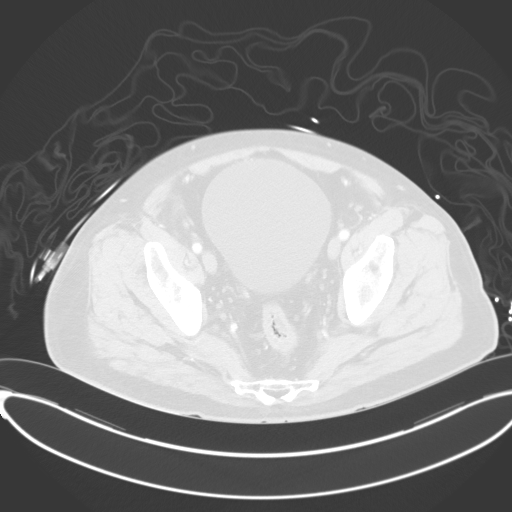
[im 37/134  lung]
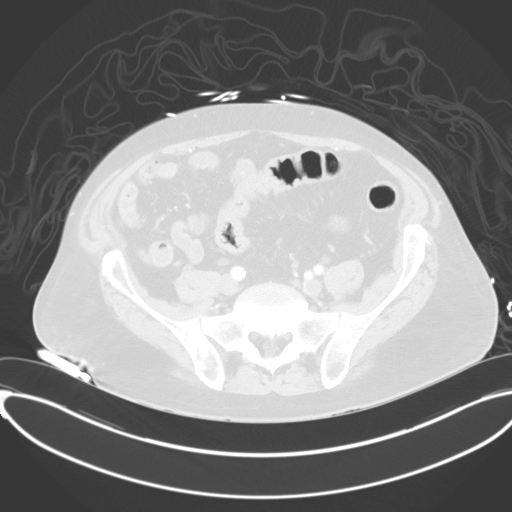
[im 49/134  lung]
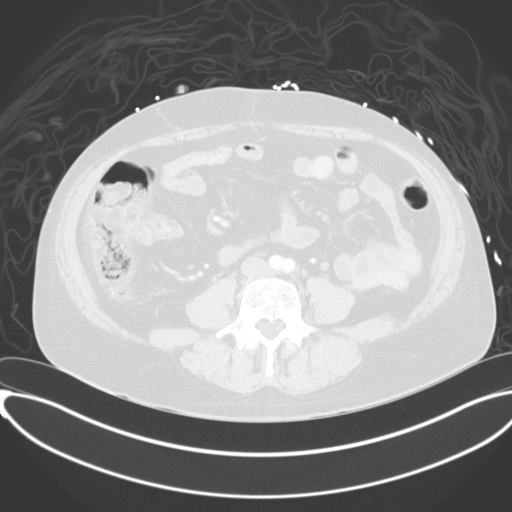
[im 61/134  mediastinal]
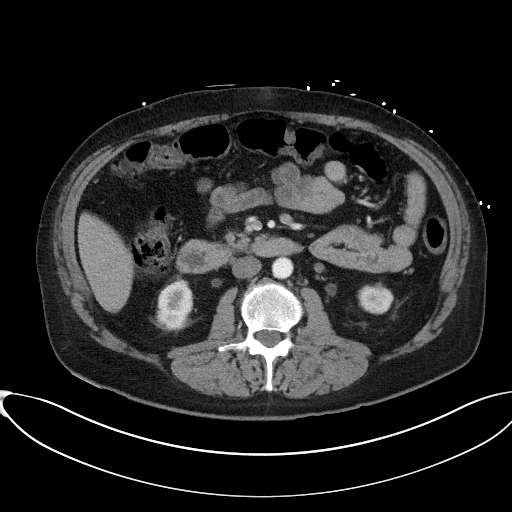
[im 61/134  lung]
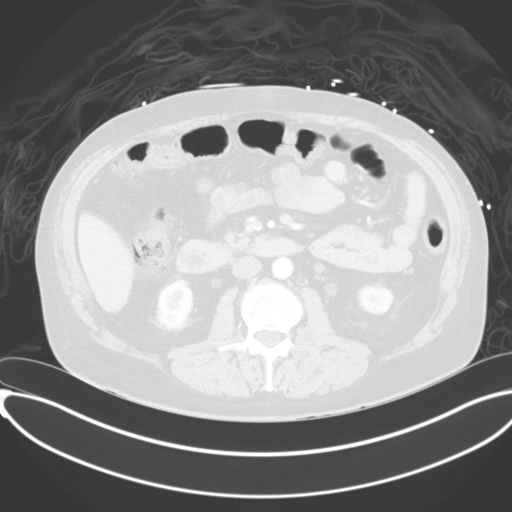
[im 73/134  lung]
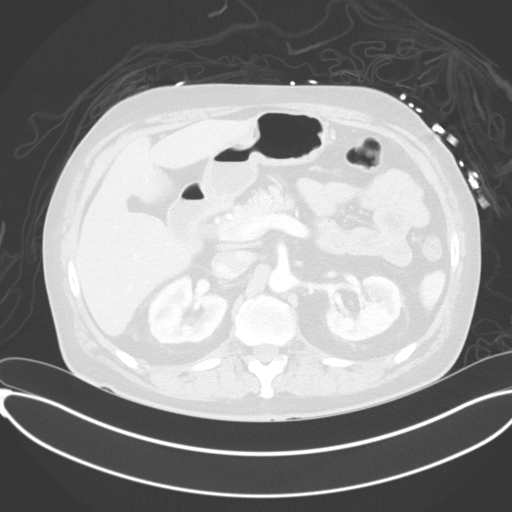
[im 85/134  lung]
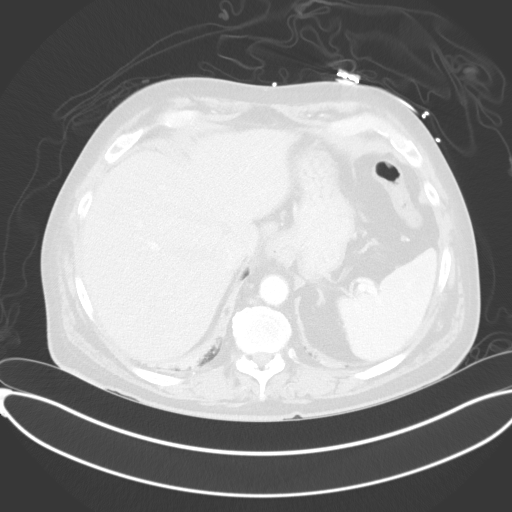
[im 97/134  lung]
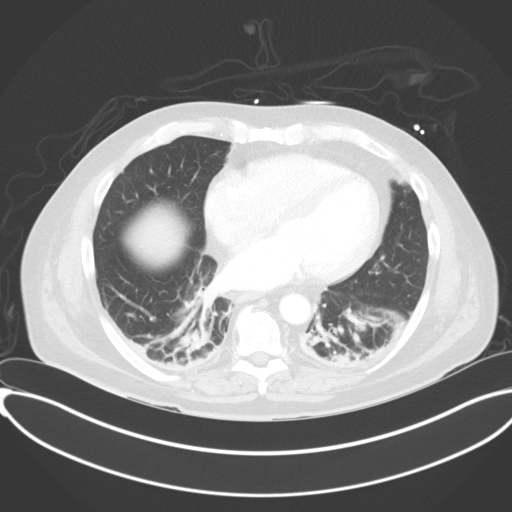
[im 109/134  mediastinal]
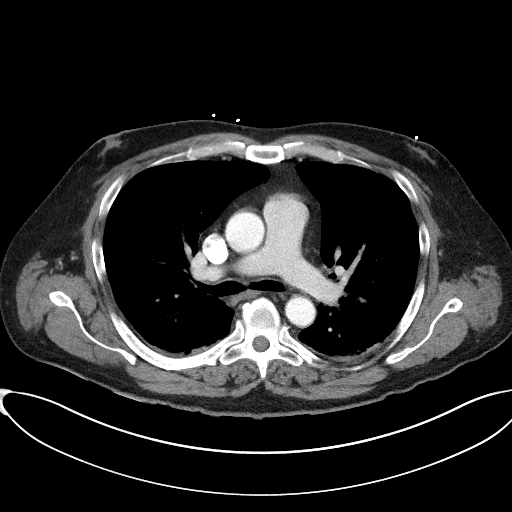
[im 109/134  lung]
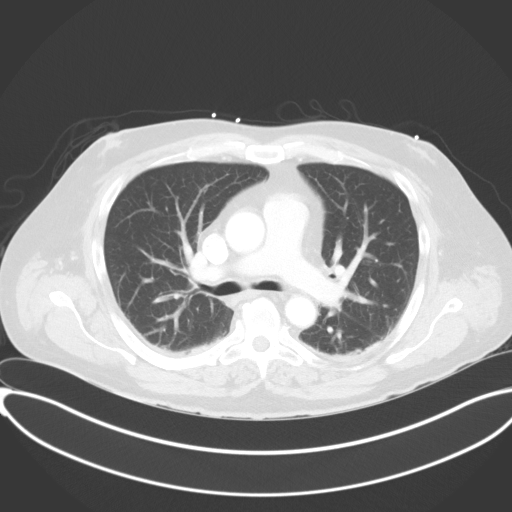
[im 121/134  lung]
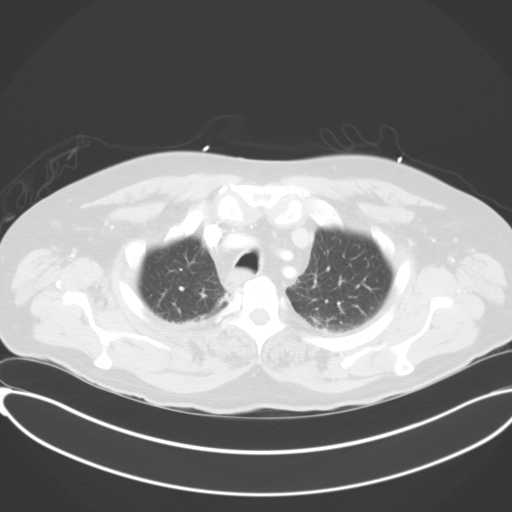

[Series 5: coronals · coronal · 0.81mm/px · 3 of 133 slices shown]
[im 27/133  lung]
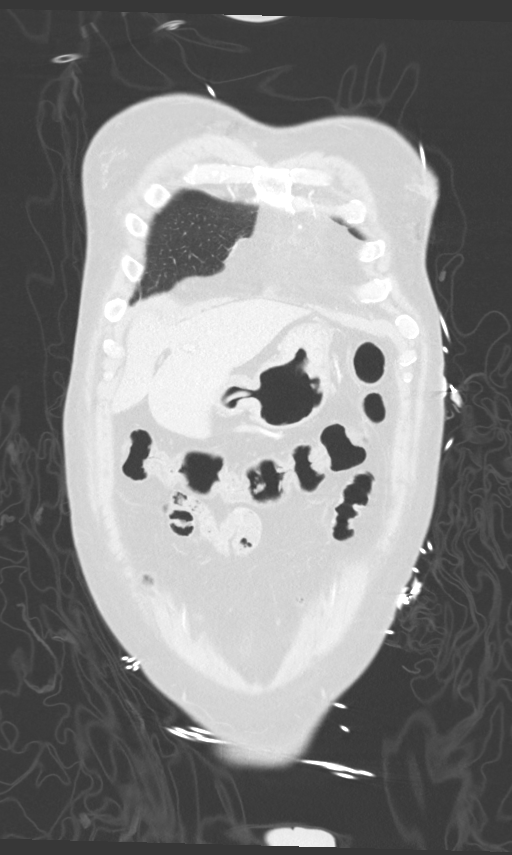
[im 53/133  lung]
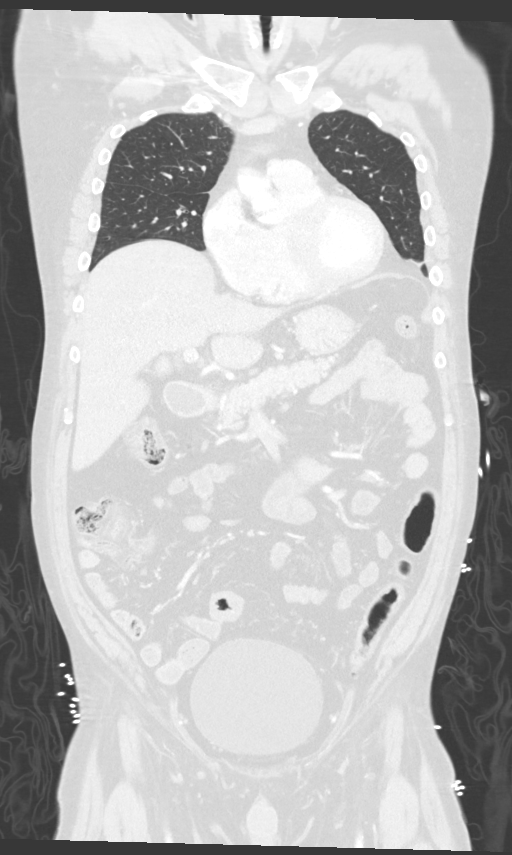
[im 80/133  lung]
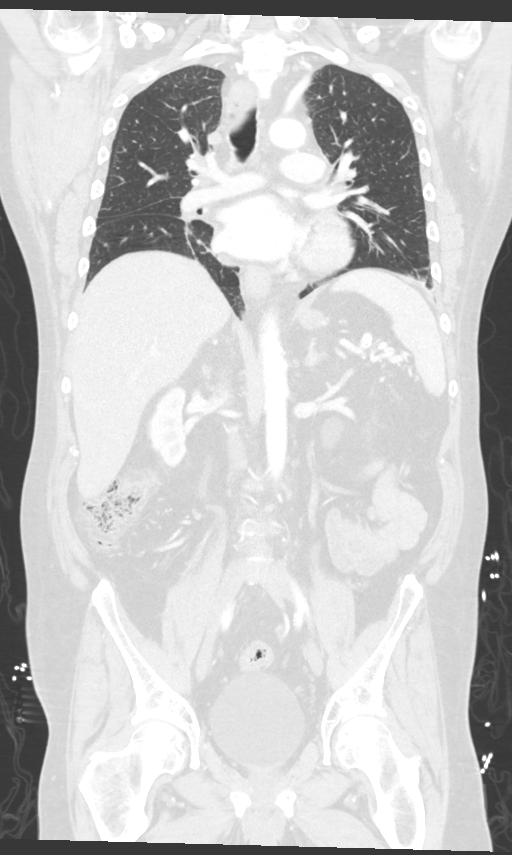

[13 of 36 positions shown; findings below may reference images not displayed]

FINDINGS: CT CHEST FINDINGS

Cardiovascular: Intact thoracic aorta. Mild Calcified aortic
atherosclerosis. Cardiac size at the upper limits of normal to
mildly increased. No pericardial effusion.

Mediastinum/Nodes: Negative. No mediastinal hematoma or
lymphadenopathy.

Lungs/Pleura: Lower lung volumes with bilateral dependent pulmonary
opacity. The opacity is fairly confluent in the lower lobes, but
enhances and still most resembles atelectasis.

Stable thickening along the confluence of the right major and minor
fissures on series 4, image 72. No pneumothorax, pleural effusion,
or pulmonary contusion.

Musculoskeletal: No rib fracture identified. Intact sternum.
Thoracic vertebrae appear stable and intact. Visible shoulder
structures appear intact. No acute osseous abnormality identified.
No superficial soft tissue injury identified.

CT ABDOMEN PELVIS FINDINGS

Hepatobiliary: Evidence of hepatic steatosis. The gallbladder
appears to be absent.

Pancreas: Negative.

Spleen: Negative.

Adrenals/Urinary Tract: Normal adrenal glands.

Bilateral nephrolithiasis, regressed on the left since [REDACTED], and
stable since [REDACTED]. Symmetric renal enhancement and contrast
excretion. Stable ureters. Distended urinary bladder today.

Stomach/Bowel: Stable rectosigmoid colon since [REDACTED]. Mild
nonspecific wall thickening throughout. Proximal sigmoid
diverticulosis. No definite active inflammation. More normal
appearance of the descending and transverse colon. Mild fatty
proliferation along the wall of the right colon. Negative appendix
and terminal ileum. No dilated small bowel. Negative stomach.
Incidental duodenum diverticula are stable.

No free air, free fluid.

Vascular/Lymphatic: Mild Calcified aortic atherosclerosis. Major
arterial structures are patent and intact. There is distal iliac and
proximal femoral artery atherosclerosis. Portal venous system
demonstrates early contrast timing but appears to be patent.

No lymphadenopathy.

Reproductive: Negative.

Other: No pelvic free fluid.

Musculoskeletal: Normal lumbar segmentation. Intact lumbar
vertebrae. Sacrum, SI joints, pelvis, and proximal femurs appear
intact.
IMPRESSION: 1. No acute traumatic injury identified in the chest abdomen or
pelvis.
2. Lower lung volumes with dependent pulmonary opacity that most
resembles atelectasis.
3. Stable nephrolithiasis.  Distended urinary bladder today.
4. Hepatic steatosis. Sigmoid diverticulosis. Chronic rectosigmoid
wall thickening which is stable since 9305 and might be
postinflammatory.

## 2019-12-07 IMAGING — DX PORTABLE CHEST - 1 VIEW
1 series · 1 of 1 positions shown · non-contrast
Comparison: 11/20/2018

CLINICAL DATA: Cough

EXAM:
PORTABLE CHEST 1 VIEW

[chest ap]
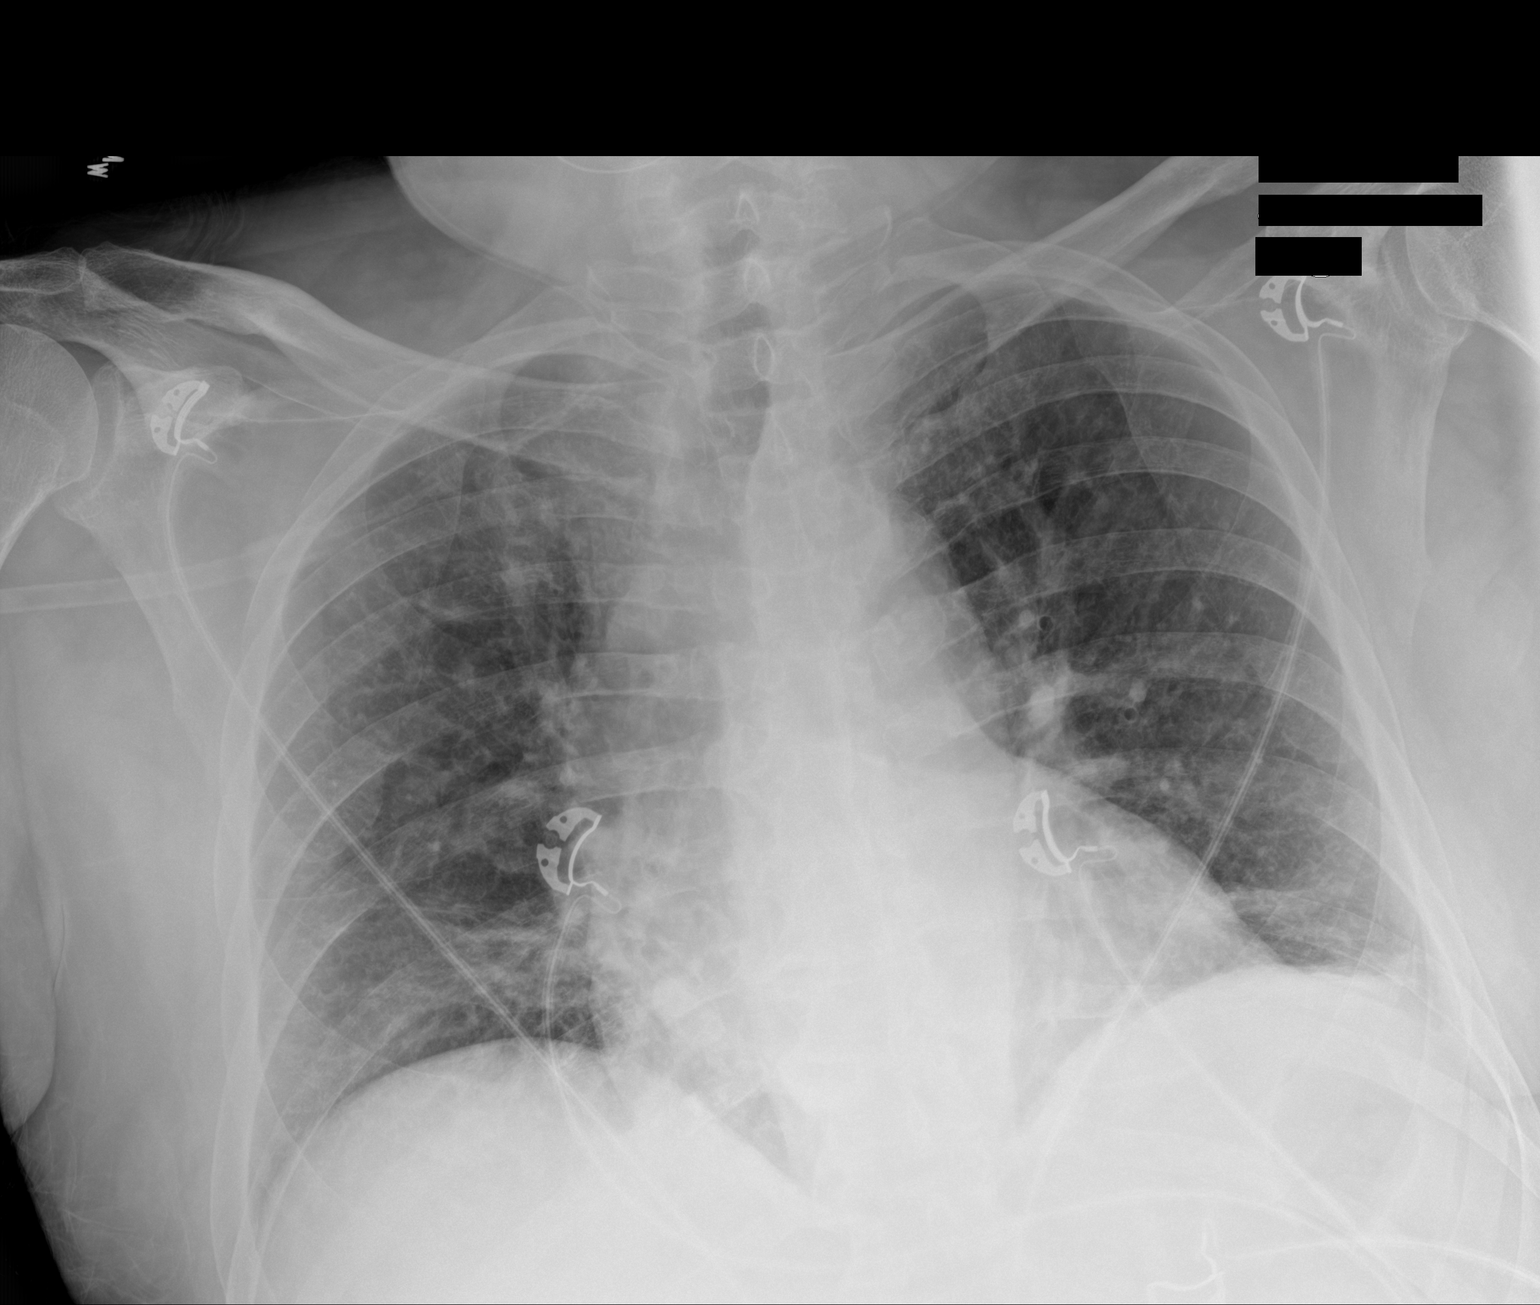

[1 of 1 positions shown; findings below may reference images not displayed]

FINDINGS: Bibasilar atelectasis without focal consolidation. No pleural
effusion or pneumothorax. Heart size is normal.
IMPRESSION: Bibasilar atelectasis.

## 2019-12-07 IMAGING — CT CT CERVICAL SPINE WITHOUT CONTRAST
4 of 7 series · 13 of 33 positions shown, 15 images · non-contrast
Comparison: CT scan of February 12, 2018.

CLINICAL DATA: Ataxia, head injury.

EXAM:
CT HEAD WITHOUT CONTRAST
CT CERVICAL SPINE WITHOUT CONTRAST
TECHNIQUE: Multidetector CT imaging of the head and cervical spine was
performed following the standard protocol without intravenous
contrast. Multiplanar CT image reconstructions of the cervical spine
were also generated.

[Series 4: coronal soft tissue · coronal · 0.34mm/px · 2 of 71 slices shown]
[im 24/71  bone]
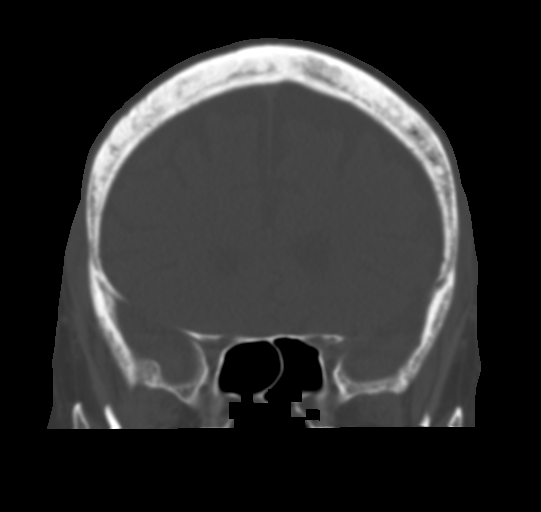
[im 47/71  bone]
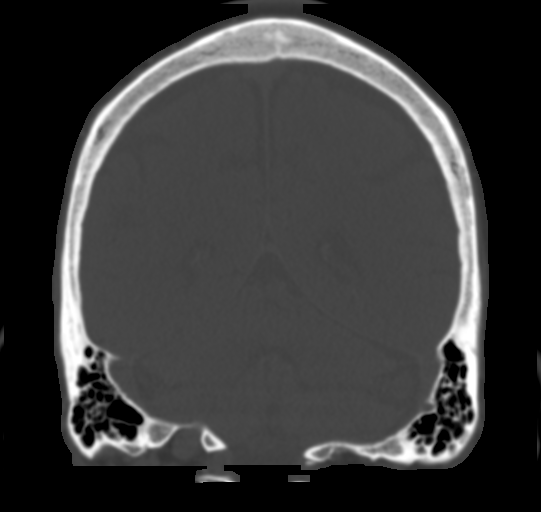

[Series 7: c spine soft · axial · 0.28mm/px · z∈[+273,+365]mm · 3 of 92 slices shown]
[im 23/92  soft-tissue]
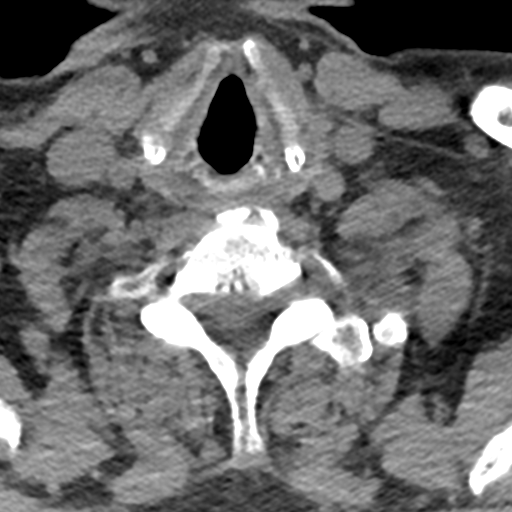
[im 46/92  soft-tissue]
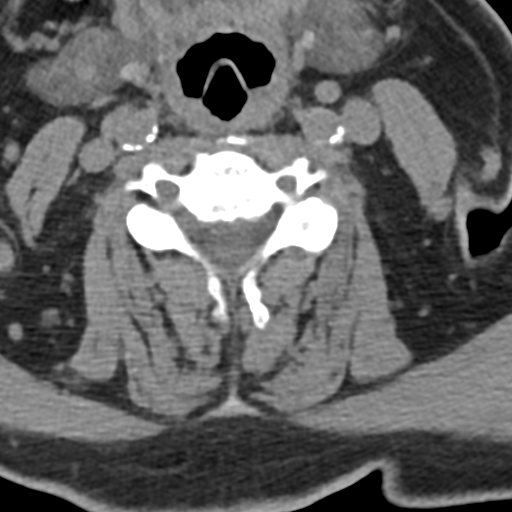
[im 69/92  soft-tissue]
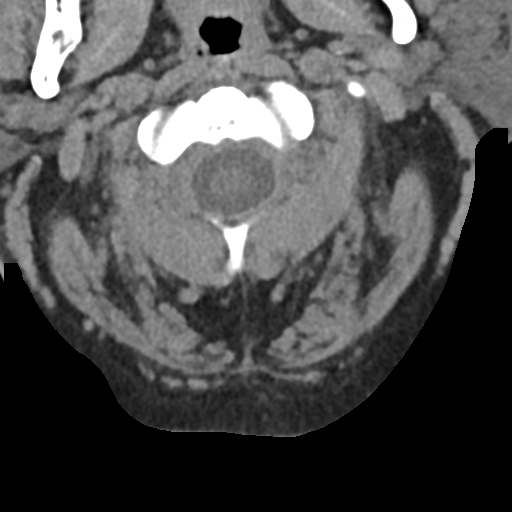

[Series 10: sagittal bone · sagittal · 0.30mm/px · 3 of 39 slices shown]
[im 10/39  bone]
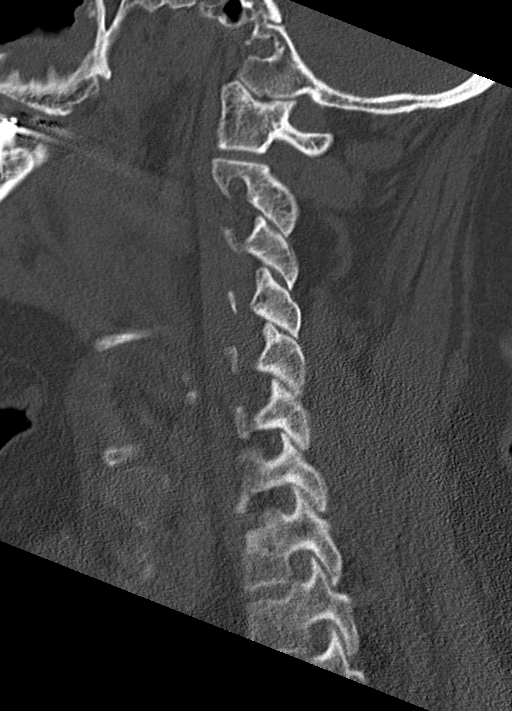
[im 20/39  bone]
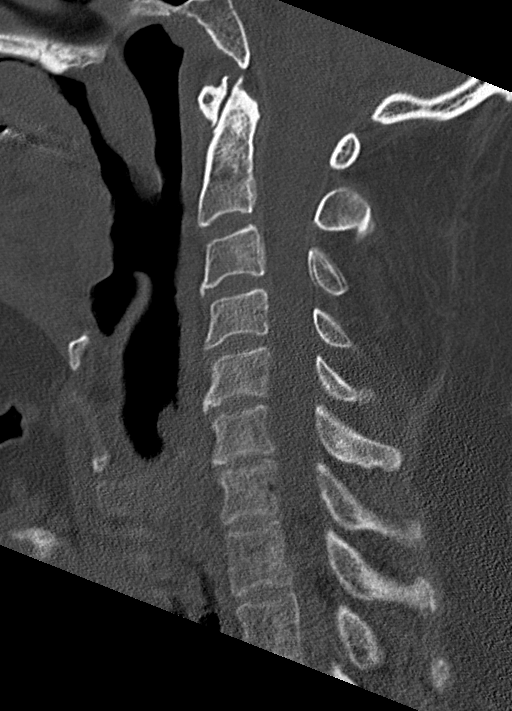
[im 29/39  bone]
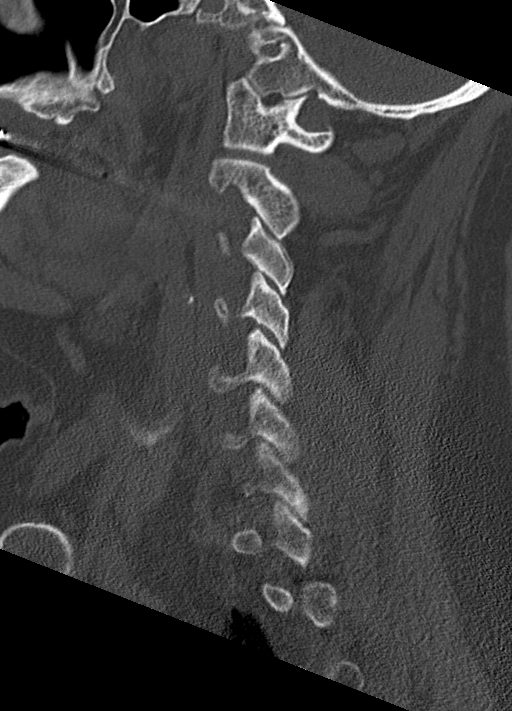

[Series 12: orthogonal bone · axial · 0.26mm/px · z∈[+210,+377]mm · 5 of 136 slices shown, 7 images]
[im 23/136  soft-tissue]
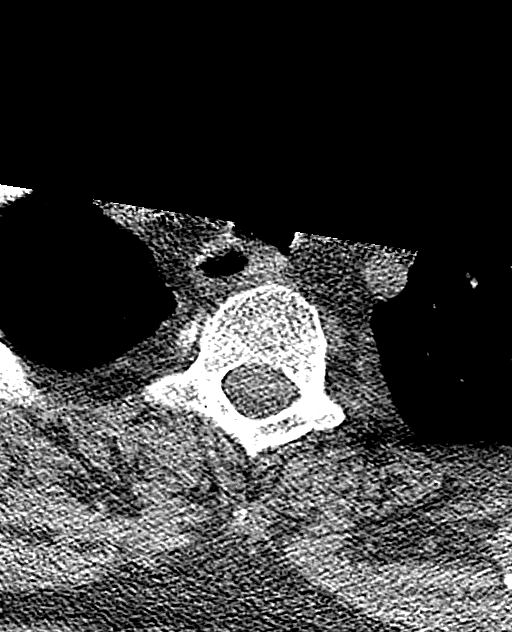
[im 23/136  bone]
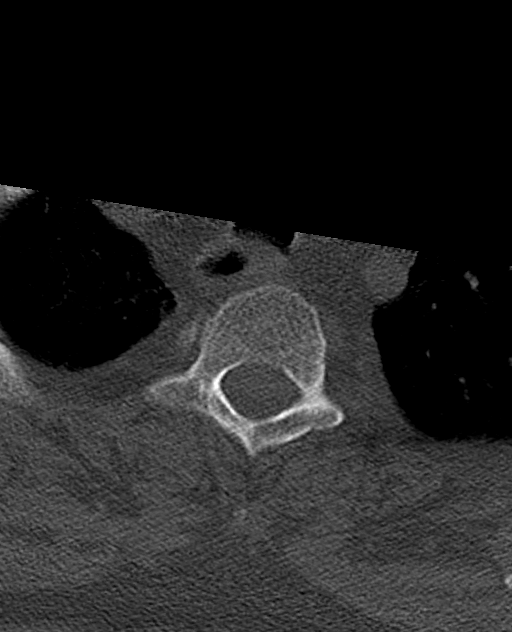
[im 46/136  bone]
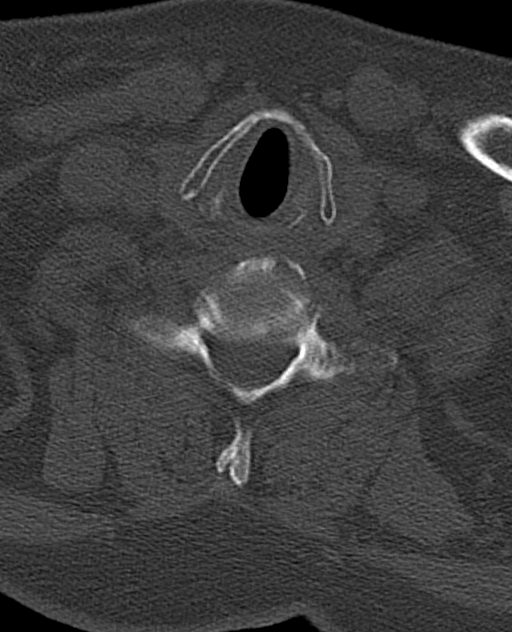
[im 68/136  bone]
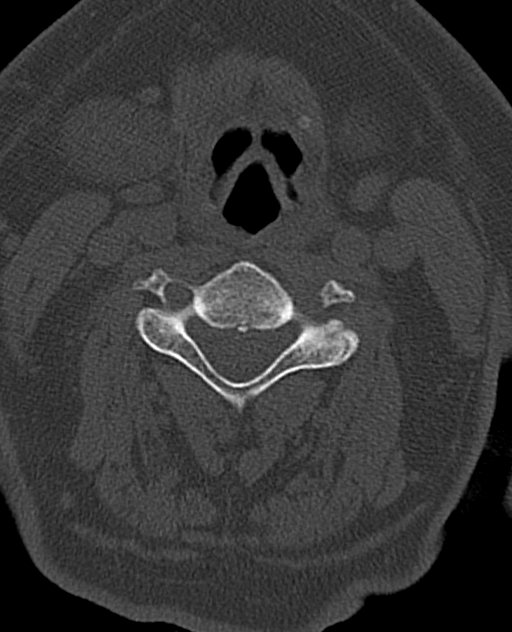
[im 91/136  bone]
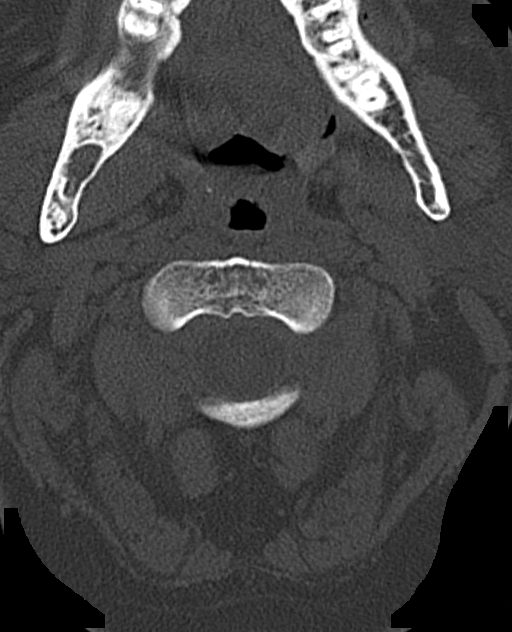
[im 113/136  soft-tissue]
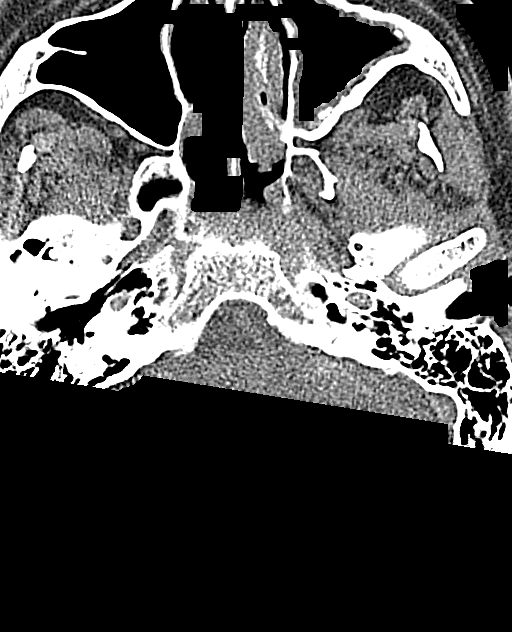
[im 113/136  bone]
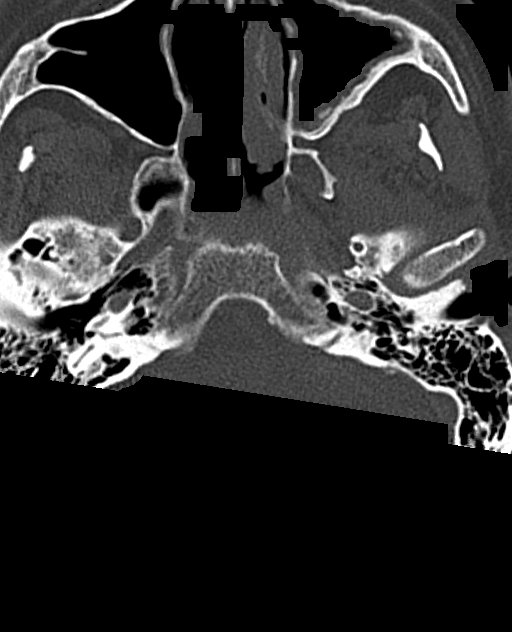

[13 of 33 positions shown; findings below may reference images not displayed]

FINDINGS: CT HEAD FINDINGS

Brain: Mild diffuse cortical atrophy is noted. Minimal chronic
ischemic white matter disease is noted. No mass effect or midline
shift is noted. Ventricular size is within normal limits. There is
no evidence of mass lesion, hemorrhage or acute infarction.

Vascular: No hyperdense vessel or unexpected calcification.

Skull: Normal. Negative for fracture or focal lesion.

Sinuses/Orbits: No acute finding.

Other: None.

CT CERVICAL SPINE FINDINGS

Alignment: Normal.

Skull base and vertebrae: No acute fracture. No primary bone lesion
or focal pathologic process.

Soft tissues and spinal canal: No prevertebral fluid or swelling. No
visible canal hematoma.

Disc levels: Mild degenerative disc disease is noted at C5-6 and
C6-7 with anterior osteophyte formation.

Upper chest: Negative.

Other: None.
IMPRESSION: Mild diffuse cortical atrophy. Minimal chronic ischemic white matter
disease. No acute intracranial abnormality seen.

Mild multilevel degenerative disc disease. No acute abnormality seen
in the cervical spine.

## 2019-12-08 IMAGING — DX RIGHT WRIST - 2 VIEW
2 series · 2 of 2 positions shown · non-contrast
Comparison: Radiographs March 07, 2017.

CLINICAL DATA: Right wrist pain after fall yesterday.

EXAM:
RIGHT WRIST - 2 VIEW

[wrist ap]
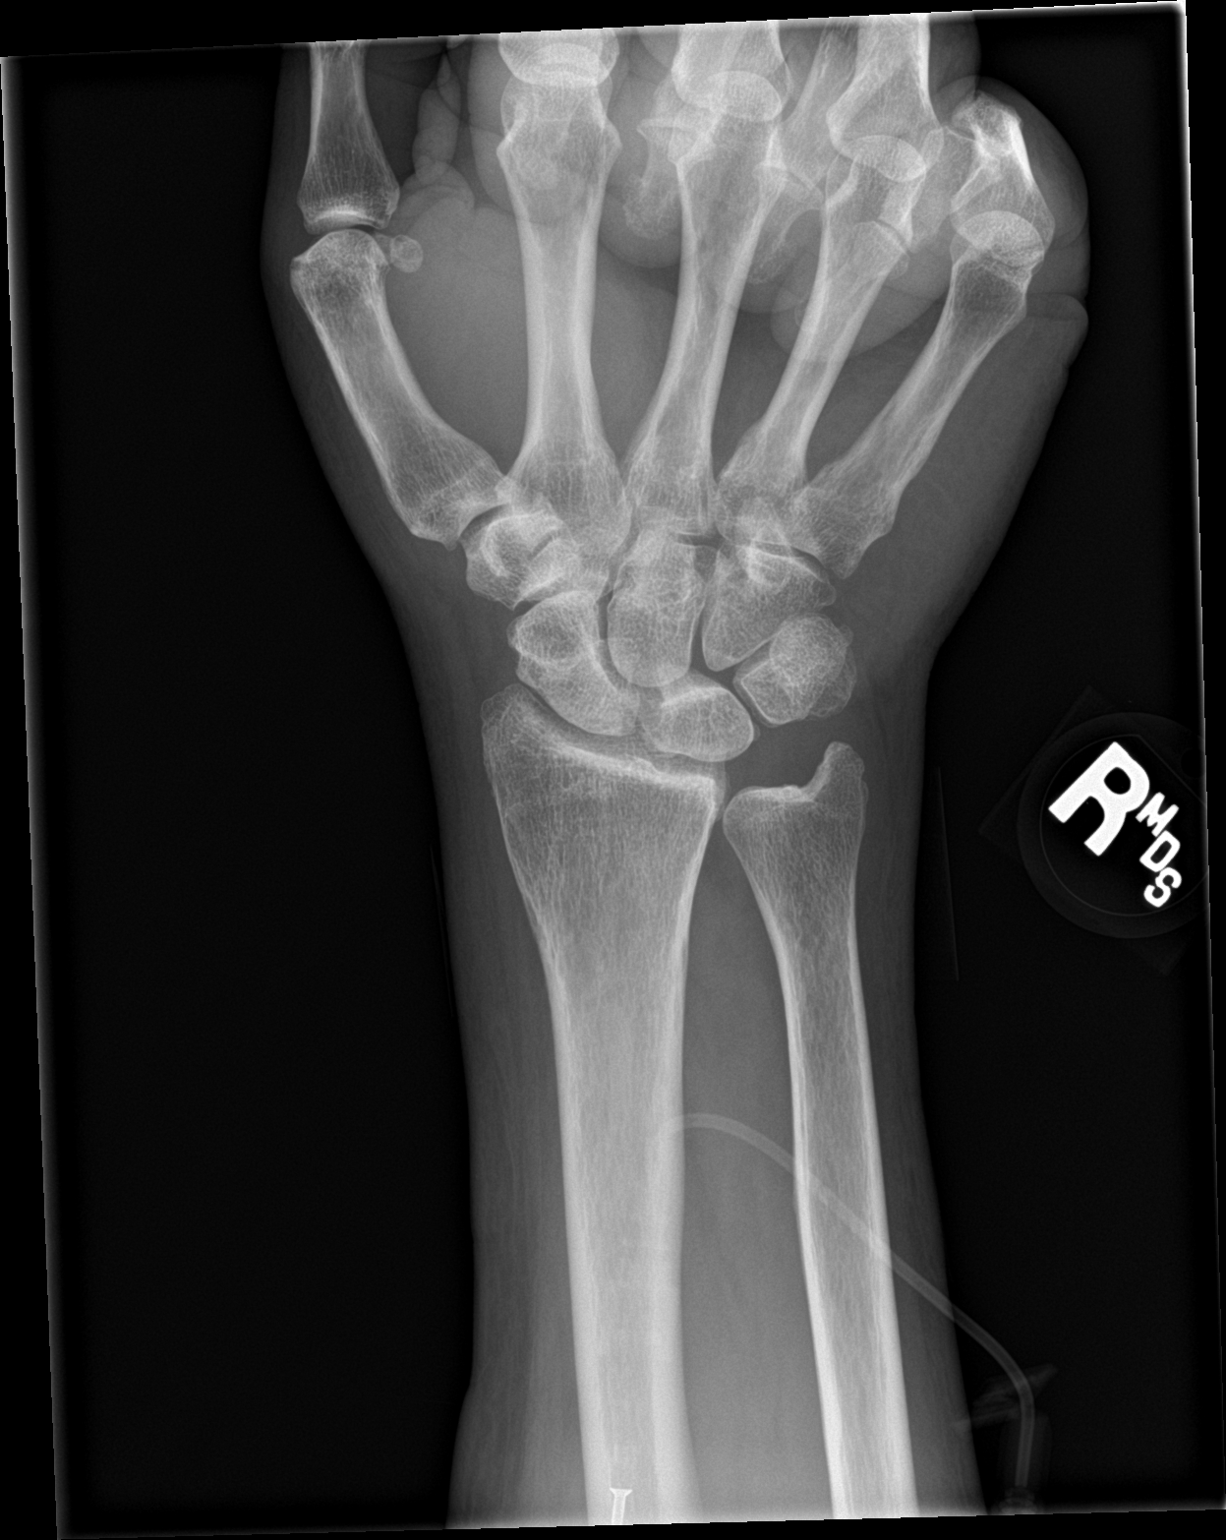

[wrist lat]
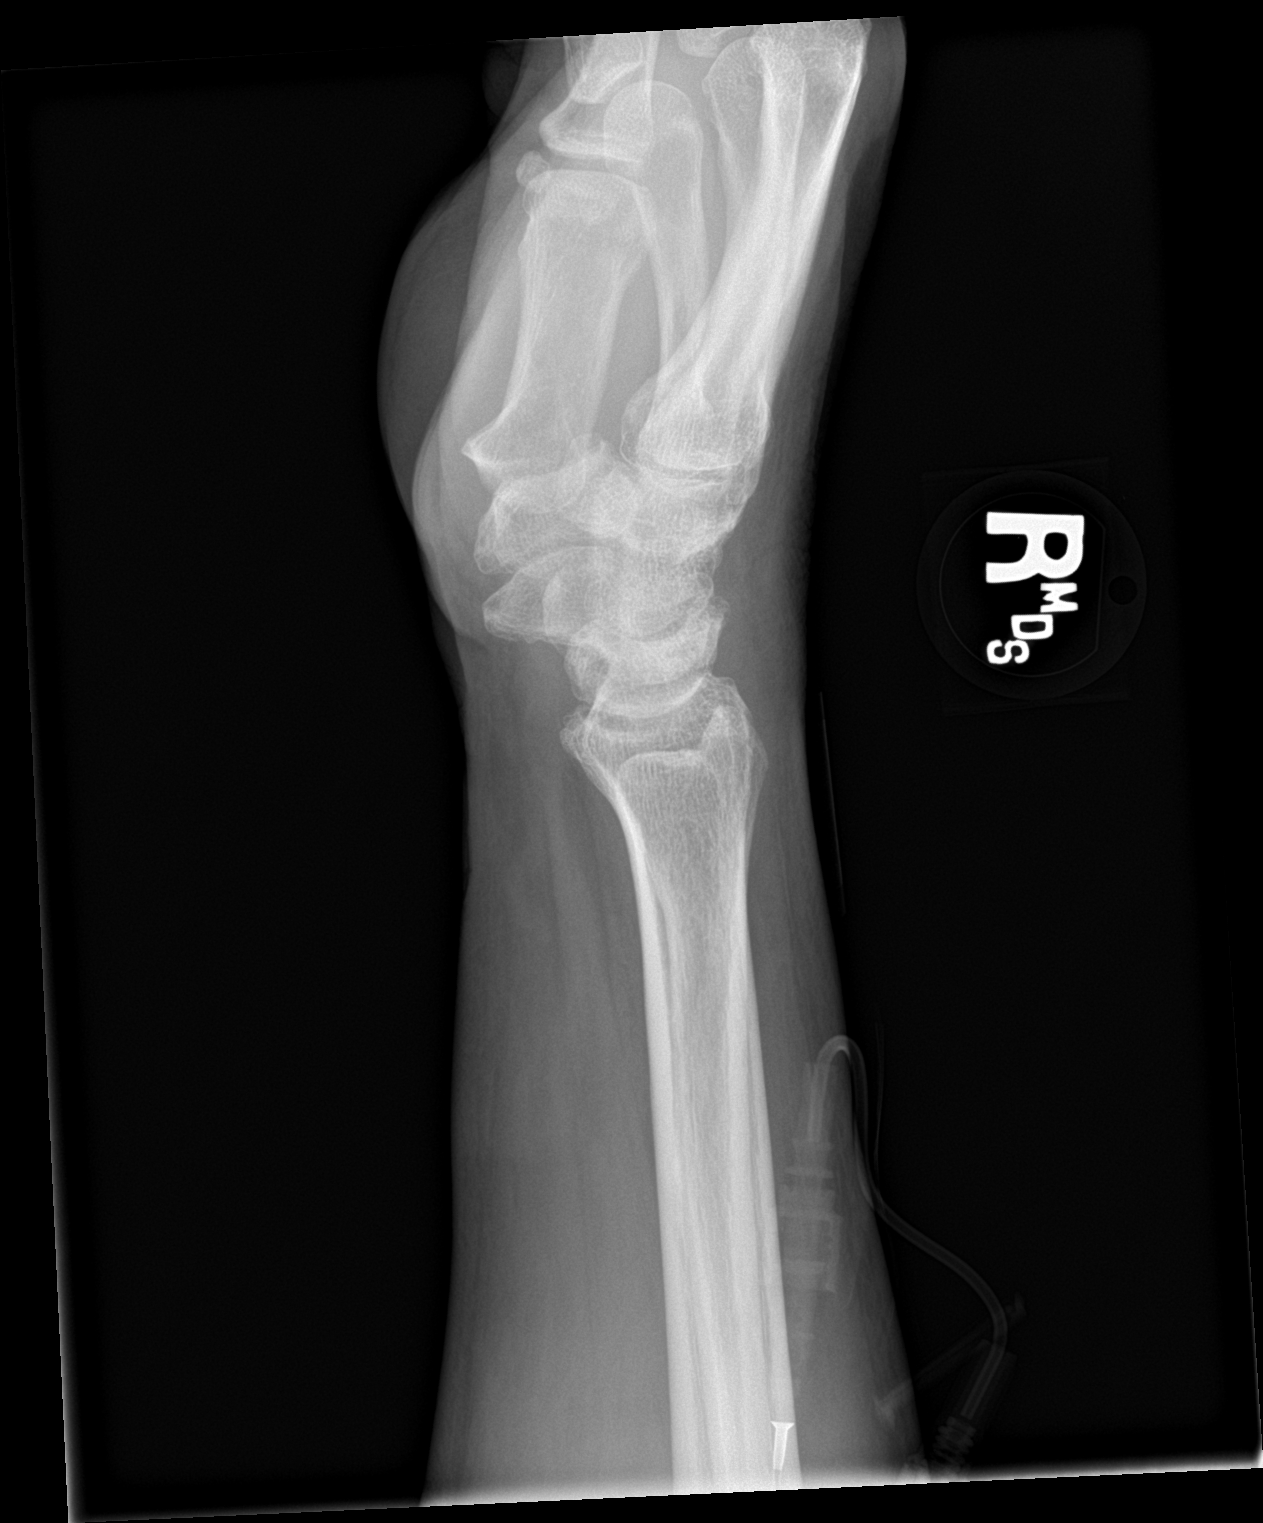

[2 of 2 positions shown; findings below may reference images not displayed]

FINDINGS: There is no evidence of fracture or dislocation. There is no
evidence of arthropathy or other focal bone abnormality. Soft
tissues are unremarkable.
IMPRESSION: Negative.

## 2019-12-08 IMAGING — DX PORTABLE CHEST - 1 VIEW
1 series · 1 of 1 positions shown · non-contrast
Comparison: 03/08/2019, 11/20/2018

CLINICAL DATA: Fever, tachycardia

EXAM:
PORTABLE CHEST 1 VIEW

[chest ap]
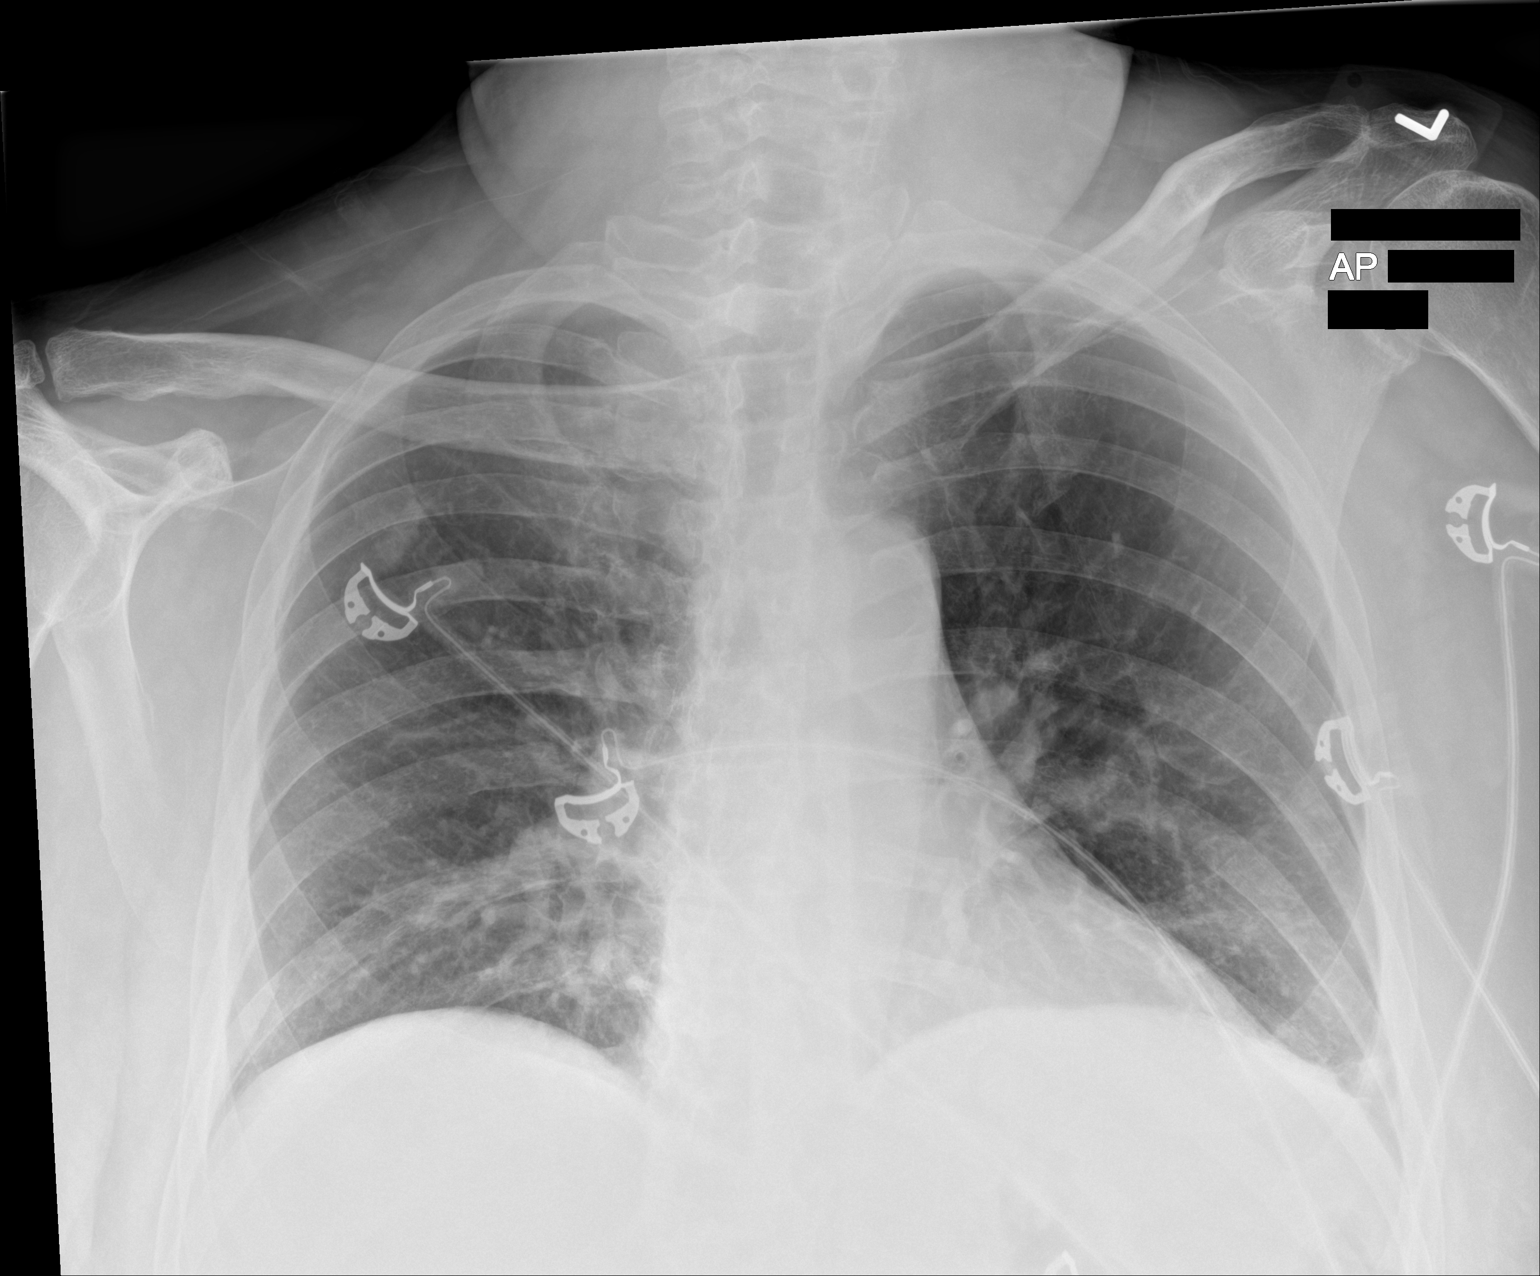

[1 of 1 positions shown; findings below may reference images not displayed]

FINDINGS: The heart size and mediastinal contours are within normal limits. No
consolidation or effusion. Mild basilar atelectasis on the left. No
pneumothorax.
IMPRESSION: No active disease.

## 2019-12-13 ENCOUNTER — Ambulatory Visit: Payer: Medicaid Other | Admitting: Gerontology
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
## 2019-12-19 IMAGING — CR RIGHT FOREARM - 2 VIEW
2 series · 2 of 2 positions shown · non-contrast
Comparison: Radiographs March 18, 2019.

CLINICAL DATA: Right upper extremity pain.

EXAM:
RIGHT FOREARM - 2 VIEW

[forearm ap]
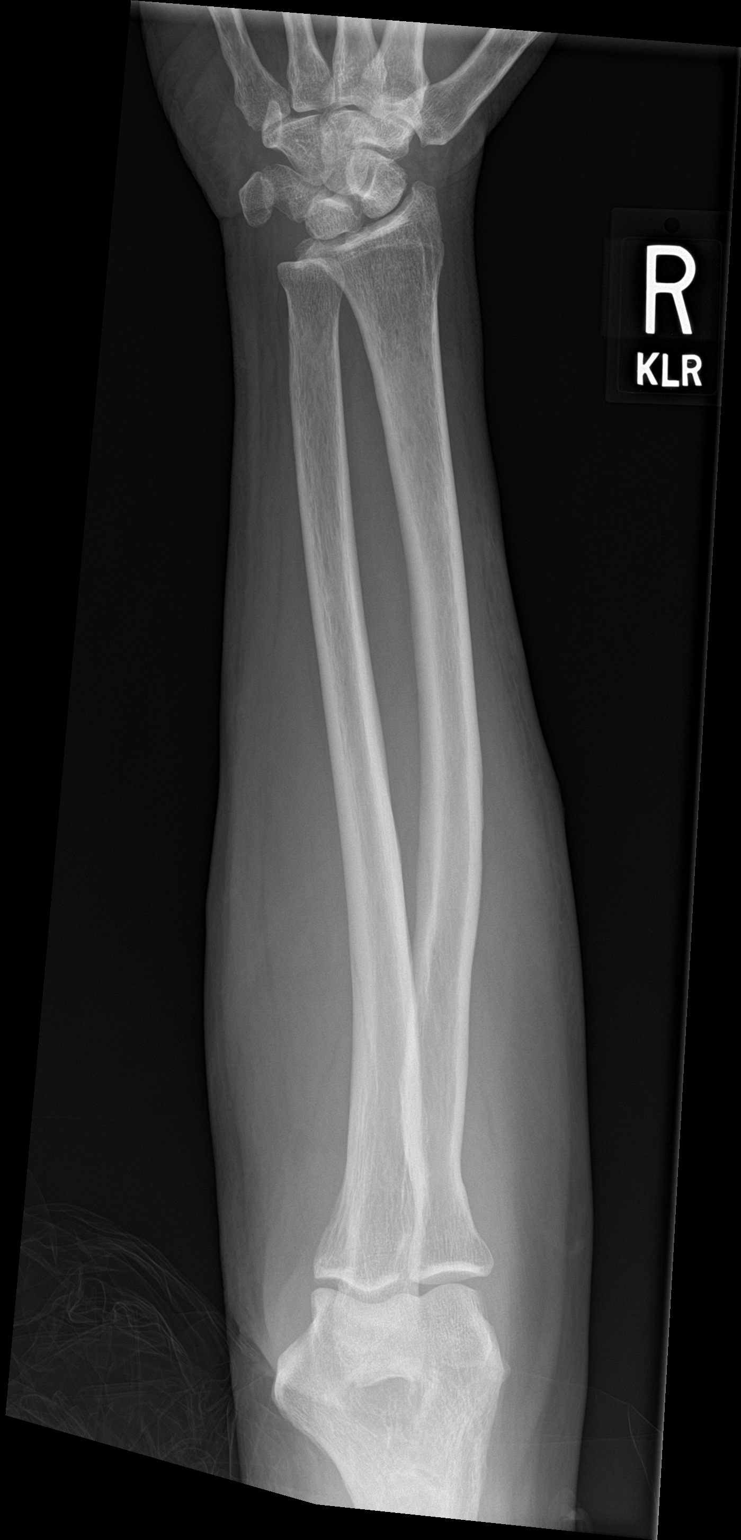

[forearm lat]
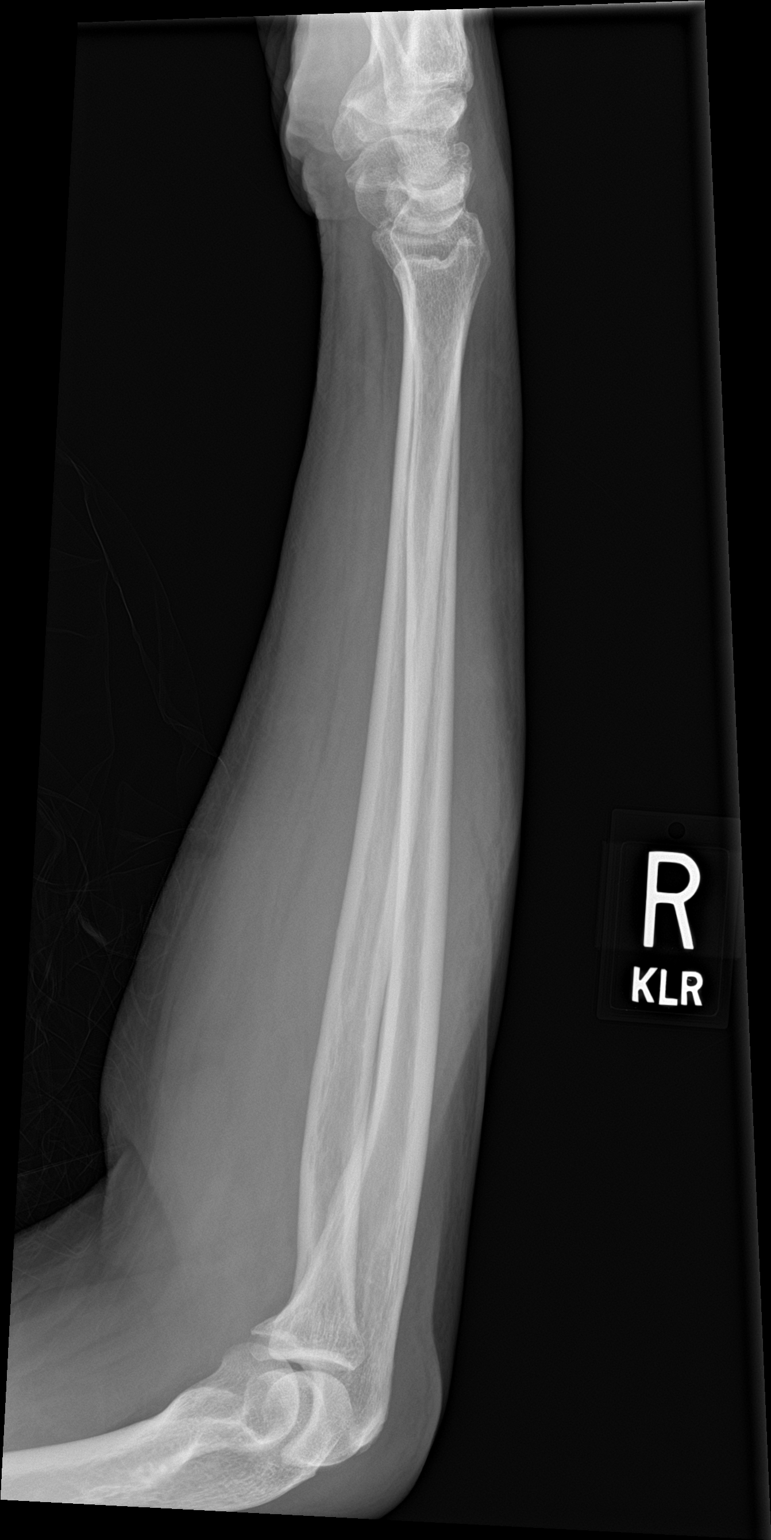

[2 of 2 positions shown; findings below may reference images not displayed]

FINDINGS: There is no evidence of fracture or other focal bone lesions. Soft
tissues are unremarkable.
IMPRESSION: Negative.

## 2019-12-19 IMAGING — CR CHEST - 2 VIEW
2 series · 2 of 2 positions shown · non-contrast
Comparison: PA and lateral chest 11/20/2018. CT chest, abdomen and
pelvis 03/08/2019.

CLINICAL DATA: Near-syncope.  Right elbow pain.

EXAM:
CHEST - 2 VIEW

[chest lat]
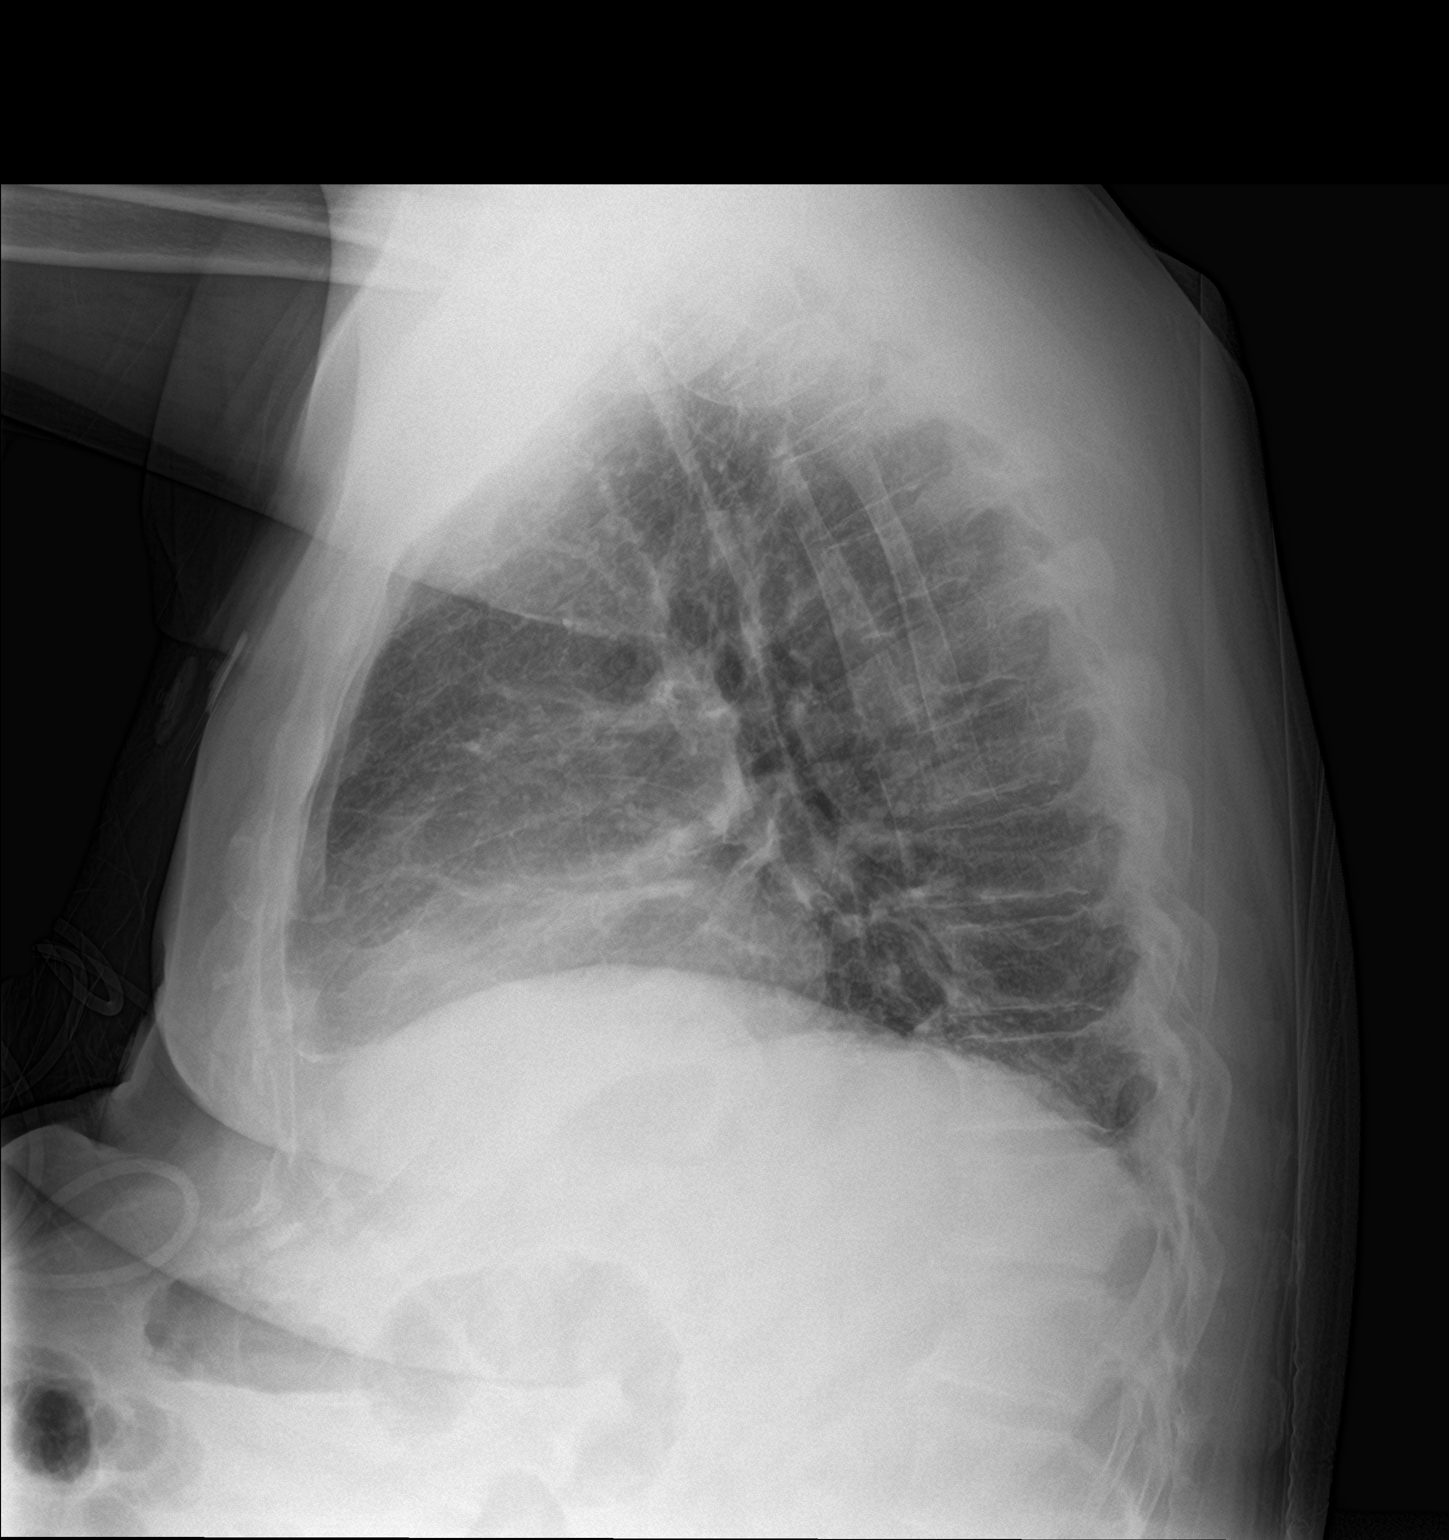

[chest ap]
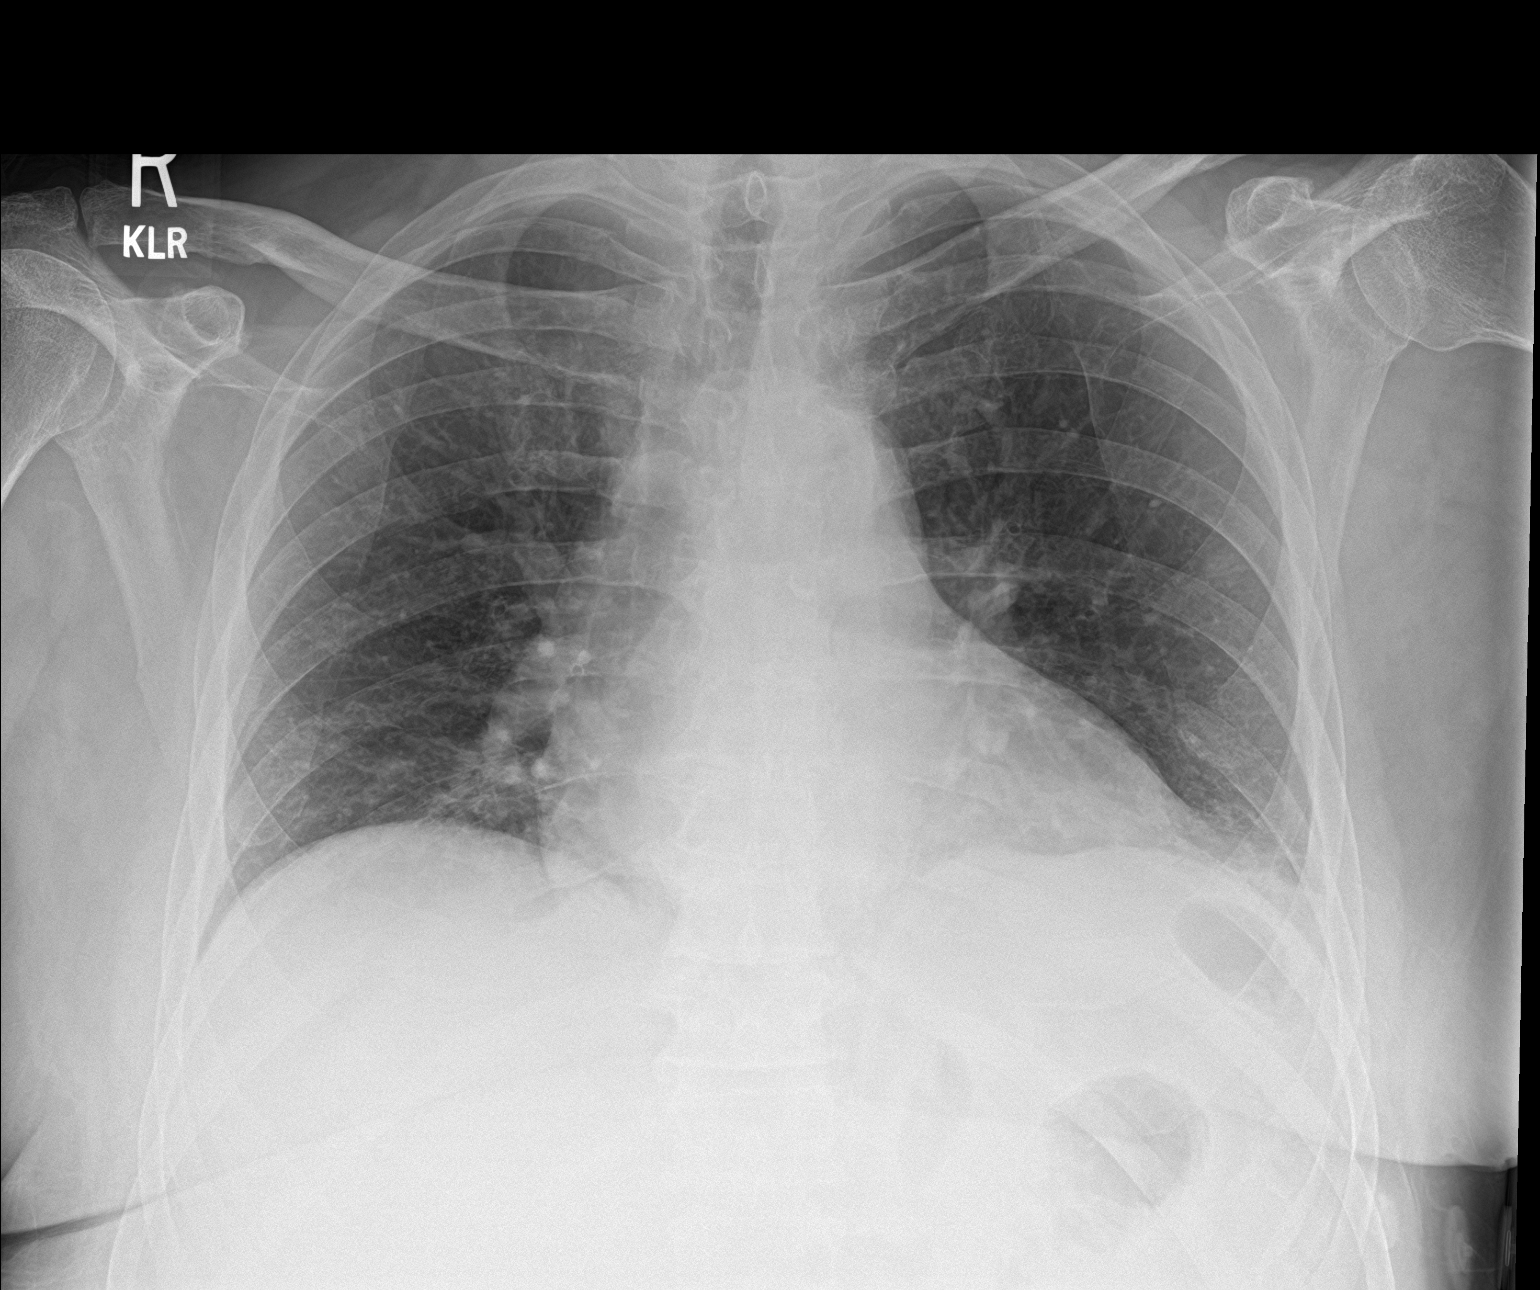

[2 of 2 positions shown; findings below may reference images not displayed]

FINDINGS: Lung volumes are low with mild left basilar atelectasis. Heart size
is upper normal. No pneumothorax or pleural effusion. No acute or
focal bony abnormality.
IMPRESSION: No acute disease.  Subsegmental atelectasis left lung base noted.

## 2019-12-20 ENCOUNTER — Encounter: Payer: Self-pay | Admitting: Gerontology

## 2019-12-20 ENCOUNTER — Ambulatory Visit: Payer: Medicaid Other | Admitting: Gerontology

## 2019-12-20 ENCOUNTER — Other Ambulatory Visit: Payer: Self-pay

## 2019-12-20 ENCOUNTER — Telehealth: Payer: Self-pay | Admitting: Gastroenterology

## 2019-12-20 VITALS — BP 138/90 | HR 95 | Ht 72.0 in | Wt 189.0 lb

## 2019-12-20 DIAGNOSIS — F1020 Alcohol dependence, uncomplicated: Secondary | ICD-10-CM

## 2019-12-20 DIAGNOSIS — I10 Essential (primary) hypertension: Secondary | ICD-10-CM

## 2019-12-20 MED ORDER — CLONIDINE HCL 0.1 MG PO TABS: 0.1000 mg | ORAL_TABLET | Freq: Every day | ORAL | 2 refills | Status: DC

## 2019-12-20 NOTE — Telephone Encounter (Signed)
Pt left vm to schedule a colonoscopy   °

## 2019-12-20 NOTE — Progress Notes (Signed)
Established Patient Office Visit  Subjective:  Patient ID: Miguel [name], male    DOB: 1961-10-15  Age: 59 y.o. MRN: [id number]  CC:  Chief Complaint  Patient presents with  . Hypertension    HPI Miguel [name] presents for follow up of hypertension and alcohol abuse. He states that he checks his blood pressure daily and it usually runs in the low 140's over high 70's. He also reports that he last had 1 pint of Vodka on 12/15/2019. He states that he continues to experience mild peripheral hand tremors, he denies withdrawal symptoms and tremor to feet. He states that he is able to drive comfortably without tremors to his feet. He was happy that his appetite has improved, he eats 3 meals daily and talks to his mother and brother in [place]. He states that his mood is good, and denies suicidal or homicidal ideation. He reports that due to Covid he doesn't want to attend group therapy at [facility]. He denies chest pain, palpitation, light headedness, fever and chills. Overall he states that his doing well and states that he promised his wife that he will not drink again.  Past Medical History:  Diagnosis Date  . Alcohol abuse   . Anxiety   . Asthma   . GERD (gastroesophageal reflux disease)   . Gout   . Hypertension   . Kidney stone   . OCD (obsessive compulsive disorder)   . Renal colic     Past Surgical History:  Procedure Laterality Date  . CHOLECYSTECTOMY  2012  . EXTRACORPOREAL SHOCK WAVE LITHOTRIPSY Left 01/12/2019   Procedure: EXTRACORPOREAL SHOCK WAVE LITHOTRIPSY (ESWL);  Surgeon: Billey Co, MD;  Location: [facility] ORS;  Service: Urology;  Laterality: Left;    Family History  Problem Relation Age of Onset  . Alcohol abuse Father   . Breast cancer Mother 22    Social History   Socioeconomic History  . Marital status: Married    Spouse name: Not on file  . Number of children: Not on file  . Years of education: Not on file  . Highest education level: Not on file   Occupational History  . Not on file  Tobacco Use  . Smoking status: Former Smoker    Types: Cigarettes  . Smokeless tobacco: Never Used  . Tobacco comment: quit 30 years ago  Substance and Sexual Activity  . Alcohol use: Yes    Alcohol/week: 12.0 standard drinks    Types: 12 Shots of liquor per week    Comment: pint daily  . Drug use: No  . Sexual activity: Yes  Other Topics Concern  . Not on file  Social History Narrative   Lives at home with his wife and takes care of his wife. Independent at baseline      - Biggest strain is financial but doesn't think they could got get more help.      Patient expressed interest in possible financial assistance. Informed written consent obtained   Social Determinants of Health   Financial Resource Strain:   . Difficulty of Paying Living Expenses: Not on file  Food Insecurity:   . Worried About Charity fundraiser in the Last Year: Not on file  . Ran Out of Food in the Last Year: Not on file  Transportation Needs:   . Lack of Transportation (Medical): Not on file  . Lack of Transportation (Non-Medical): Not on file  Physical Activity:   . Days of Exercise per Week: Not  on file  . Minutes of Exercise per Session: Not on file  Stress:   . Feeling of Stress : Not on file  Social Connections:   . Frequency of Communication with Friends and Family: Not on file  . Frequency of Social Gatherings with Friends and Family: Not on file  . Attends Religious Services: Not on file  . Active Member of Clubs or Organizations: Not on file  . Attends Archivist Meetings: Not on file  . Marital Status: Not on file  Intimate Partner Violence:   . Fear of Current or Ex-Partner: Not on file  . Emotionally Abused: Not on file  . Physically Abused: Not on file  . Sexually Abused: Not on file    Outpatient Medications Prior to Visit  Medication Sig Dispense Refill  . allopurinol (ZYLOPRIM) 300 MG tablet Take 1 tablet (300 mg total) by  mouth daily. 30 tablet 1  . ascorbic acid (VITAMIN C) 500 MG tablet Take 1 tablet (500 mg total) by mouth daily. 30 tablet 1  . colchicine 0.6 MG tablet Take 1 tablet (0.6 mg total) by mouth daily as needed. 30 tablet 1  . Fluticasone-Salmeterol (ADVAIR DISKUS) 100-50 MCG/DOSE AEPB Inhale 1 puff into the lungs 2 (two) times daily. [id number] each 0  . folic acid (FOLVITE) 1 MG tablet Take 1 tablet (1 mg total) by mouth daily. 30 tablet 1  . hydrOXYzine (ATARAX/VISTARIL) 25 MG tablet Take 1 tablet (25 mg total) by mouth 3 (three) times daily as needed for anxiety. 60 tablet 1  . lisinopril (ZESTRIL) 40 MG tablet Take 1 tablet (40 mg total) by mouth daily. 30 tablet 1  . Multiple Vitamins-Iron (MULTIVITAMINS WITH IRON) TABS tablet Take 1 tablet by mouth daily. 30 tablet 1  . pantoprazole (PROTONIX) 40 MG tablet Take 1 tablet (40 mg total) by mouth daily. 30 tablet 2  . QUEtiapine (SEROQUEL) 50 MG tablet Take 1 tablet (50 mg total) by mouth at bedtime. 60 tablet 1  . thiamine 250 MG tablet Take 1 tablet (250 mg total) by mouth daily. 30 tablet 1  . ciprofloxacin (CIPRO) 500 MG tablet Take 1 tablet (500 mg total) by mouth 2 (two) times daily. 14 tablet 0  . citalopram (CELEXA) 20 MG tablet Take 1 tablet (20 mg total) by mouth daily. 30 tablet 1  . cloNIDine (CATAPRES) 0.1 MG tablet Take 1 tablet (0.1 mg total) by mouth daily. 30 tablet 0  . cyclobenzaprine (FLEXERIL) 5 MG tablet Take 1 tablet (5 mg total) by mouth at bedtime. 30 tablet 0  . metroNIDAZOLE (FLAGYL) 500 MG tablet Take 1 tablet (500 mg total) by mouth 3 (three) times daily. 30 tablet 0  . naproxen (NAPROSYN) 500 MG tablet Take 1 tablet (500 mg total) by mouth 2 (two) times daily with a meal. 20 tablet 0  . ondansetron (ZOFRAN ODT) 4 MG disintegrating tablet Take 1 tablet (4 mg total) by mouth every 8 (eight) hours as needed for nausea or vomiting. 20 tablet 0   No facility-administered medications prior to visit.    Allergies  Allergen  Reactions  . Percocet [Oxycodone-Acetaminophen] Nausea And Vomiting    ROS Review of Systems  Constitutional: Negative.   Respiratory: Negative.   Cardiovascular: Negative.   Skin: Negative.   Neurological: Positive for tremors (mild peripheral tremor).  Psychiatric/Behavioral: Negative.       Objective:    Physical Exam  Constitutional: He is oriented to person, place, and time. He appears well-developed.  HENT:  Head: Normocephalic and atraumatic.  Eyes: Pupils are equal, round, and reactive to light. EOM are normal.  Cardiovascular: Normal rate and regular rhythm.  Pulmonary/Chest: Effort normal and breath sounds normal.  Neurological: He is alert and oriented to person, place, and time. He displays tremor (mild hand tremor).  Skin: Skin is warm and dry.  Psychiatric: He has a normal mood and affect. His behavior is normal. Judgment and thought content normal.    BP 138/90 (BP Location: Right Arm, Patient Position: Sitting)   Pulse 95   Ht 6' (1.829 m)   Wt 189 lb (85.7 kg)   SpO2 100%   BMI 25.63 kg/m  Wt Readings from Last 3 Encounters:  12/21/19 185 lb (83.9 kg)  12/20/19 189 lb (85.7 kg)  12/01/19 190 lb (86.2 kg)     Health Maintenance Due  Topic Date Due  . Hepatitis C Screening  Sep 12, 1961  . COLONOSCOPY  10/11/2009    There are no preventive care reminders to display for this patient.  Lab Results  Component Value Date   TSH 0.545 05/07/2019   Lab Results  Component Value Date   WBC 5.8 12/01/2019   HGB 14.0 12/01/2019   HCT 41.8 12/01/2019   MCV 88.2 12/01/2019   PLT 203 12/01/2019   Lab Results  Component Value Date   NA 143 12/01/2019   K 3.1 (L) 12/01/2019   CO2 22 12/01/2019   GLUCOSE 114 (H) 12/01/2019   BUN 11 12/01/2019   CREATININE 0.99 12/01/2019   BILITOT 0.7 12/01/2019   ALKPHOS 66 12/01/2019   AST 15 12/01/2019   ALT 13 12/01/2019   PROT 7.0 12/01/2019   ALBUMIN 4.2 12/01/2019   CALCIUM 8.9 12/01/2019   ANIONGAP 13  12/01/2019   Lab Results  Component Value Date   CHOL 144 03/12/2017   Lab Results  Component Value Date   HDL 52 03/12/2017   Lab Results  Component Value Date   LDLCALC 71 03/12/2017   Lab Results  Component Value Date   TRIG 107 03/12/2017   Lab Results  Component Value Date   CHOLHDL 2.8 03/12/2017   Lab Results  Component Value Date   HGBA1C 5.1 03/12/2017      Assessment & Plan:    1. Essential hypertension - His blood pressure in under control, and he will continue on current treatment regimen. He was advised to check blood pressure weekly, record and bring log to follow up appointment. He was encouraged to continue on DASH diet and exercise as tolerated. - cloNIDine (CATAPRES) 0.1 MG tablet; Take 1 tablet (0.1 mg total) by mouth daily.  Dispense: 30 tablet; Refill: 2  2. Alcohol use disorder, severe, dependence ([place]) - He was strongly encouraged to contact [facility] for substance abuse counseling. He was provided with the Crisis help line information.    Follow-up: Return in about 6 weeks (around 01/31/2020), or if symptoms worsen or fail to improve.    Timarie Labell Jerold Coombe, NP

## 2019-12-20 NOTE — Telephone Encounter (Signed)
Returned patients call to schedule colonoscopy.  Left message with wife to ask him to call office back.  Scheduling Note:  Referral is from 10/25/20. Make patient aware Per Traci's referral note [facility] does not cover colonoscopy.  This will be a self pay.  Thanks,  [employer], [place]

## 2019-12-20 NOTE — Patient Instructions (Signed)
Alcohol Abuse and Dependence Information, Adult Alcohol is a widely available drug. People drink alcohol in different amounts. People who drink alcohol very often and in large amounts often have problems during and after drinking. They may develop what is called an alcohol use disorder. There are two main types of alcohol use disorders:  Alcohol abuse. This is when you use alcohol too much or too often. You may use alcohol to make yourself feel happy or to reduce stress. You may have a hard time setting a limit on the amount you drink.  Alcohol dependence. This is when you use alcohol consistently for a period of time, and your body changes as a result. This can make it hard to stop drinking because you may start to feel sick or feel different when you do not use alcohol. These symptoms are known as withdrawal. How can alcohol abuse and dependence affect me? Alcohol abuse and dependence can have a negative effect on your life. Drinking too much can lead to addiction. You may feel like you need alcohol to function normally. You may drink alcohol before work in the morning, during the day, or as soon as you get home from work in the evening. These actions can result in:  Poor work performance.  Job loss.  Financial problems.  Car crashes or criminal charges from driving after drinking alcohol.  Problems in your relationships with friends and family.  Losing the trust and respect of coworkers, friends, and family. Drinking heavily over a long period of time can permanently damage your body and brain, and can cause lifelong health issues, such as:  Damage to your liver or pancreas.  Heart problems, high blood pressure, or stroke.  Certain cancers.  Decreased ability to fight infections.  Brain or nerve damage.  Depression.  Early (premature) death. If you are careless or you crave alcohol, it is easy to drink more than your body can handle (overdose). Alcohol overdose is a serious  situation that requires hospitalization. It may lead to permanent injuries or death. What can increase my risk?  Having a family history of alcohol abuse.  Having depression or other mental health conditions.  Beginning to drink at an early age.  Binge drinking often.  Experiencing trauma, stress, and an unstable home life during childhood.  Spending time with people who drink often. What actions can I take to prevent or manage alcohol abuse and dependence?  Do not drink alcohol if: ? Your health care provider tells you not to drink. ? You are pregnant, may be pregnant, or are planning to become pregnant.  If you drink alcohol: ? Limit how much you use to:  0-1 drink a day for women.  0-2 drinks a day for men. ? Be aware of how much alcohol is in your drink. In the [place] one drink equals one 12 oz bottle of beer (355 mL), one 5 oz glass of wine (148 mL), or one 1 oz glass of hard liquor (44 mL).  Stop drinking if you have been drinking too much. This can be very hard to do if you are used to abusing alcohol. If you begin to have withdrawal symptoms, talk with your health care provider or a person that you trust. These symptoms may include anxiety, shaky hands, headache, nausea, sweating, or not being able to sleep.  Choose to drink nonalcoholic beverages in social gatherings and places where there may be alcohol. Activity  Spend more time on activities that you enjoy that do   not involve alcohol, like hobbies or exercise.  Find healthy ways to cope with stress, such as exercise, meditation, or spending time with people you care about. General information  Talk to your family, coworkers, and friends about supporting you in your efforts to stop drinking. If they drink, ask them not to drink around you. Spend more time with people who do not drink alcohol.  If you think that you have an alcohol dependency problem: ? Tell friends or family about your concerns. ? Talk with your  health care provider or another health professional about where to get help. ? Work with a therapist and a chemical dependency counselor. ? Consider joining a support group for people who struggle with alcohol abuse and dependence. Where to find support   Your health care provider.  SMART Recovery: www.smartrecovery.org Therapy and support groups  Local treatment centers or chemical dependency counselors.  Local AA groups in your community: www.aa.org Where to find more information  Centers for Disease Control and Prevention: www.cdc.gov  National Institute on Alcohol Abuse and Alcoholism: www.niaaa.nih.gov  Alcoholics Anonymous (AA): www.aa.org Contact a health care provider if:  You drank more or for longer than you intended on more than one occasion.  You tried to stop drinking or to cut back on how much you drink, but you were not able to.  You often drink to the point of vomiting or passing out.  You want to drink so badly that you cannot think about anything else.  You have problems in your life due to drinking, but you continue to drink.  You keep drinking even though you feel anxious, depressed, or have experienced memory loss.  You have stopped doing the things you used to enjoy in order to drink.  You have to drink more than you used to in order to get the effect you want.  You experience anxiety, sweating, nausea, shakiness, and trouble sleeping when you try to stop drinking. Get help right away if:  You have thoughts about hurting yourself or others.  You have serious withdrawal symptoms, including: ? Confusion. ? Racing heart. ? High blood pressure. ? Fever. If you ever feel like you may hurt yourself or others, or have thoughts about taking your own life, get help right away. You can go to your nearest emergency department or call:  Your local emergency services (911 in the [place]  A suicide crisis helpline, such as the National Suicide Prevention  Lifeline at 1-800-273-8255. This is open 24 hours a day. Summary  Alcohol abuse and dependence can have a negative effect on your life. Drinking too much or too often can lead to addiction.  If you drink alcohol, limit how much you use.  If you are having trouble keeping your drinking under control, find ways to change your behavior. Hobbies, calming activities, exercise, or support groups can help.  If you feel you need help with changing your drinking habits, talk with your health care provider, a good friend, or a therapist, or go to an AA group. This information is not intended to replace advice given to you by your health care provider. Make sure you discuss any questions you have with your health care provider. Document Revised: 02/21/2019 Document Reviewed: 01/10/2019 Elsevier Patient Education  2020 [employer] DASH Eating Plan DASH stands for "Dietary Approaches to Stop Hypertension." The DASH eating plan is a healthy eating plan that has been shown to reduce high blood pressure (hypertension). It may also reduce your risk for   type 2 diabetes, heart disease, and stroke. The DASH eating plan may also help with weight loss. What are tips for following this plan?  General guidelines  Avoid eating more than 2,300 mg (milligrams) of salt (sodium) a day. If you have hypertension, you may need to reduce your sodium intake to 1,500 mg a day.  Limit alcohol intake to no more than 1 drink a day for nonpregnant women and 2 drinks a day for men. One drink equals 12 oz of beer, 5 oz of wine, or 1 oz of hard liquor.  Work with your health care provider to maintain a healthy body weight or to lose weight. Ask what an ideal weight is for you.  Get at least 30 minutes of exercise that causes your heart to beat faster (aerobic exercise) most days of the week. Activities may include walking, swimming, or biking.  Work with your health care provider or diet and nutrition specialist (dietitian) to  adjust your eating plan to your individual calorie needs. Reading food labels   Check food labels for the amount of sodium per serving. Choose foods with less than 5 percent of the Daily Value of sodium. Generally, foods with less than 300 mg of sodium per serving fit into this eating plan.  To find whole grains, look for the word "whole" as the first word in the ingredient list. Shopping  Buy products labeled as "low-sodium" or "no salt added."  Buy fresh foods. Avoid canned foods and premade or frozen meals. Cooking  Avoid adding salt when cooking. Use salt-free seasonings or herbs instead of table salt or sea salt. Check with your health care provider or pharmacist before using salt substitutes.  Do not fry foods. Cook foods using healthy methods such as baking, boiling, grilling, and broiling instead.  Cook with heart-healthy oils, such as olive, canola, soybean, or sunflower oil. Meal planning  Eat a balanced diet that includes: ? 5 or more servings of fruits and vegetables each day. At each meal, try to fill half of your plate with fruits and vegetables. ? Up to 6-8 servings of whole grains each day. ? Less than 6 oz of lean meat, poultry, or fish each day. A 3-oz serving of meat is about the same size as a deck of cards. One egg equals 1 oz. ? 2 servings of low-fat dairy each day. ? A serving of nuts, seeds, or beans 5 times each week. ? Heart-healthy fats. Healthy fats called Omega-3 fatty acids are found in foods such as flaxseeds and coldwater fish, like sardines, salmon, and mackerel.  Limit how much you eat of the following: ? Canned or prepackaged foods. ? Food that is high in trans fat, such as fried foods. ? Food that is high in saturated fat, such as fatty meat. ? Sweets, desserts, sugary drinks, and other foods with added sugar. ? Full-fat dairy products.  Do not salt foods before eating.  Try to eat at least 2 vegetarian meals each week.  Eat more home-cooked  food and less restaurant, buffet, and fast food.  When eating at a restaurant, ask that your food be prepared with less salt or no salt, if possible. What foods are recommended? The items listed may not be a complete list. Talk with your dietitian about what dietary choices are best for you. Grains Whole-grain or whole-wheat bread. Whole-grain or whole-wheat pasta. Brown rice. Oatmeal. Quinoa. Bulgur. Whole-grain and low-sodium cereals. Pita bread. Low-fat, low-sodium crackers. Whole-wheat flour tortillas. Vegetables Fresh or   frozen vegetables (raw, steamed, roasted, or grilled). Low-sodium or reduced-sodium tomato and vegetable juice. Low-sodium or reduced-sodium tomato sauce and tomato paste. Low-sodium or reduced-sodium canned vegetables. Fruits All fresh, dried, or frozen fruit. Canned fruit in natural juice (without added sugar). Meat and other protein foods Skinless chicken or turkey. Ground chicken or turkey. Pork with fat trimmed off. Fish and seafood. Egg whites. Dried beans, peas, or lentils. Unsalted nuts, nut butters, and seeds. Unsalted canned beans. Lean cuts of beef with fat trimmed off. Low-sodium, lean deli meat. Dairy Low-fat (1%) or fat-free (skim) milk. Fat-free, low-fat, or reduced-fat cheeses. Nonfat, low-sodium ricotta or cottage cheese. Low-fat or nonfat yogurt. Low-fat, low-sodium cheese. Fats and oils Soft margarine without trans fats. Vegetable oil. Low-fat, reduced-fat, or light mayonnaise and salad dressings (reduced-sodium). Canola, safflower, olive, soybean, and sunflower oils. Avocado. Seasoning and other foods Herbs. Spices. Seasoning mixes without salt. Unsalted popcorn and pretzels. Fat-free sweets. What foods are not recommended? The items listed may not be a complete list. Talk with your dietitian about what dietary choices are best for you. Grains Baked goods made with fat, such as croissants, muffins, or some breads. Dry pasta or rice meal  packs. Vegetables Creamed or fried vegetables. Vegetables in a cheese sauce. Regular canned vegetables (not low-sodium or reduced-sodium). Regular canned tomato sauce and paste (not low-sodium or reduced-sodium). Regular tomato and vegetable juice (not low-sodium or reduced-sodium). Pickles. Olives. Fruits Canned fruit in a light or heavy syrup. Fried fruit. Fruit in cream or butter sauce. Meat and other protein foods Fatty cuts of meat. Ribs. Fried meat. Bacon. Sausage. Bologna and other processed lunch meats. Salami. Fatback. Hotdogs. Bratwurst. Salted nuts and seeds. Canned beans with added salt. Canned or smoked fish. Whole eggs or egg yolks. Chicken or turkey with skin. Dairy Whole or 2% milk, cream, and half-and-half. Whole or full-fat cream cheese. Whole-fat or sweetened yogurt. Full-fat cheese. Nondairy creamers. Whipped toppings. Processed cheese and cheese spreads. Fats and oils Butter. Stick margarine. Lard. Shortening. Ghee. Bacon fat. Tropical oils, such as coconut, palm kernel, or palm oil. Seasoning and other foods Salted popcorn and pretzels. Onion salt, garlic salt, seasoned salt, table salt, and sea salt. Worcestershire sauce. Tartar sauce. Barbecue sauce. Teriyaki sauce. Soy sauce, including reduced-sodium. Steak sauce. Canned and packaged gravies. Fish sauce. Oyster sauce. Cocktail sauce. Horseradish that you find on the shelf. Ketchup. Mustard. Meat flavorings and tenderizers. Bouillon cubes. Hot sauce and Tabasco sauce. Premade or packaged marinades. Premade or packaged taco seasonings. Relishes. Regular salad dressings. Where to find more information:  National Heart, Lung, and Blood Institute: www.nhlbi.nih.gov  American Heart Association: www.heart.org Summary  The DASH eating plan is a healthy eating plan that has been shown to reduce high blood pressure (hypertension). It may also reduce your risk for type 2 diabetes, heart disease, and stroke.  With the DASH eating  plan, you should limit salt (sodium) intake to 2,300 mg a day. If you have hypertension, you may need to reduce your sodium intake to 1,500 mg a day.  When on the DASH eating plan, aim to eat more fresh fruits and vegetables, whole grains, lean proteins, low-fat dairy, and heart-healthy fats.  Work with your health care provider or diet and nutrition specialist (dietitian) to adjust your eating plan to your individual calorie needs. This information is not intended to replace advice given to you by your health care provider. Make sure you discuss any questions you have with your health care provider. Document Revised: 10/15/2017 Document   Reviewed: 10/26/2016 Elsevier Patient Education  [employer].

## 2019-12-21 ENCOUNTER — Emergency Department: Payer: Self-pay

## 2019-12-21 ENCOUNTER — Emergency Department
Admission: EM | Admit: 2019-12-21 | Discharge: 2019-12-21 | Disposition: A | Discharge status: Home / Self Care | Payer: Self-pay | Attending: Student | Admitting: Student

## 2019-12-21 ENCOUNTER — Encounter: Payer: Self-pay | Admitting: Emergency Medicine

## 2019-12-21 ENCOUNTER — Other Ambulatory Visit: Payer: Self-pay

## 2019-12-21 DIAGNOSIS — Y929 Unspecified place or not applicable: Secondary | ICD-10-CM | POA: Insufficient documentation

## 2019-12-21 DIAGNOSIS — Z79899 Other long term (current) drug therapy: Secondary | ICD-10-CM | POA: Insufficient documentation

## 2019-12-21 DIAGNOSIS — Y999 Unspecified external cause status: Secondary | ICD-10-CM | POA: Insufficient documentation

## 2019-12-21 DIAGNOSIS — I10 Essential (primary) hypertension: Secondary | ICD-10-CM | POA: Insufficient documentation

## 2019-12-21 DIAGNOSIS — X509XXA Other and unspecified overexertion or strenuous movements or postures, initial encounter: Secondary | ICD-10-CM | POA: Insufficient documentation

## 2019-12-21 DIAGNOSIS — Y939 Activity, unspecified: Secondary | ICD-10-CM | POA: Insufficient documentation

## 2019-12-21 DIAGNOSIS — S4351XA Sprain of right acromioclavicular joint, initial encounter: Secondary | ICD-10-CM | POA: Insufficient documentation

## 2019-12-21 DIAGNOSIS — Z87891 Personal history of nicotine dependence: Secondary | ICD-10-CM | POA: Insufficient documentation

## 2019-12-21 MED ORDER — CYCLOBENZAPRINE HCL 10 MG PO TABS: 10.0000 mg | ORAL_TABLET | Freq: Three times a day (TID) | ORAL | 0 refills | Status: DC | PRN

## 2019-12-21 MED ORDER — IBUPROFEN 600 MG PO TABS: 600.0000 mg | ORAL_TABLET | Freq: Three times a day (TID) | ORAL | 0 refills | Status: DC | PRN

## 2019-12-21 MED ORDER — CITALOPRAM HYDROBROMIDE 20 MG PO TABS: 20.0000 mg | ORAL_TABLET | Freq: Every day | ORAL | 0 refills | Status: DC

## 2019-12-21 MED ORDER — LIDOCAINE 5 % EX PTCH
1.0000 | MEDICATED_PATCH | CUTANEOUS | Status: DC
  Administered 2019-12-21: 1 via TRANSDERMAL
  Filled 2019-12-21: qty 1

## 2019-12-21 NOTE — Discharge Instructions (Addendum)
Follow discharge care instruction take medication as directed. °

## 2019-12-21 NOTE — ED Notes (Signed)
Se triage note  Presents with right shoulder pain  States pain started couple of days ago   Pain is from neck radiating into posterior shoulder  Denies any injury

## 2019-12-21 NOTE — ED Triage Notes (Signed)
Says pain right shoulder if he lifts it even slightly for several day.

## 2019-12-21 NOTE — ED Triage Notes (Signed)
Patient presents to the ED with right upper arm pain and shoulder pain.  Patient state pain feels similar to gout that he has had in his feet.  Patient saw his PCP yesterday and was told to take tylenol and ibuprofen but patient states pain is now excruciating and affected his sleep last night.  Patient states pain started on Tuesday morning.  Patient denies known injury.

## 2019-12-21 NOTE — ED Provider Notes (Signed)
[facility] Emergency Department Provider Note   ____________________________________________   First MD Initiated Contact with Patient 12/21/19 1442     (approximate)  I have reviewed the triage vital signs and the nursing notes.   HISTORY  Chief Complaint Arm Pain    HPI Miguel [name] is a 59 y.o. male patient complain of right upper arm and shoulder pain.  Condition started 2 days ago.  Patient states that he believes he slept "wrong".  Patient saw his PCP yesterday and was told to take Tylenol ibuprofen.  Patient state he was not given a diagnosis.  Patient stated pain increased last night affecting sleep.  Patient is a recovering alcoholic states no alcohol in the last 30 days.  Patient denies loss sensation.  Patient states decreased range of motion with abduction and overhead reaching.         Past Medical History:  Diagnosis Date  . Alcohol abuse   . Anxiety   . Asthma   . GERD (gastroesophageal reflux disease)   . Gout   . Hypertension   . Kidney stone   . OCD (obsessive compulsive disorder)   . Renal colic     Patient Active Problem List   Diagnosis Date Noted  . Alcohol abuse with alcohol-induced mood disorder ([place]) 12/02/2019  . Moderate episode of recurrent major depressive disorder ([place])   . Health care maintenance 10/26/2019  . Right knee pain 10/26/2019  . Generalized anxiety disorder 10/14/2019  . MDD (major depressive disorder), recurrent severe, without psychosis ([place]) 07/27/2019  . Social anxiety disorder 07/27/2019  . Left-sided chest wall pain 06/14/2019  . Alcoholic cirrhosis of liver without ascites ([place]) 06/14/2019  . Alcohol abuse 06/14/2019  . AKI (acute kidney injury) ([place]) 05/27/2019  . Alcohol withdrawal ([place]) 05/07/2019  . Alcoholic intoxication without complication ([place])   . Sepsis ([place]) 03/08/2019  . Breast lump or mass 10/04/2018  . Sepsis secondary to UTI ([place]) 09/14/2018  . Asthma 07/07/2018  . GERD  (gastroesophageal reflux disease) 07/07/2018  . OCD (obsessive compulsive disorder) 07/07/2018  . Self-inflicted laceration of wrist ([place]) 03/10/2018  . Leg hematoma 12/25/2017  . Suicide and self-inflicted injury by cutting and piercing instrument ([place]) 03/10/2017  . Severe recurrent major 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 ([place]) 03/09/2017  . Substance induced mood disorder ([place]) 08/15/2016  . Involuntary commitment 08/15/2016  . Alcohol use disorder, severe, dependence ([place]) 02/05/2016  . Hypertension 12/05/2015  . Tachycardia 12/05/2015  . Gout 11/13/2015  . Chronic back pain 05/02/2015    Past Surgical History:  Procedure Laterality Date  . CHOLECYSTECTOMY  2012  . EXTRACORPOREAL SHOCK WAVE LITHOTRIPSY Left 01/12/2019   Procedure: EXTRACORPOREAL SHOCK WAVE LITHOTRIPSY (ESWL);  Surgeon: Billey Co, MD;  Location: [facility] ORS;  Service: Urology;  Laterality: Left;    Prior to Admission medications   Medication Sig Start Date End Date Taking? Authorizing Provider  allopurinol (ZYLOPRIM) 300 MG tablet Take 1 tablet (300 mg total) by mouth daily. 10/26/19   Iloabachie, Chioma E, NP  ascorbic acid (VITAMIN C) 500 MG tablet Take 1 tablet (500 mg total) by mouth daily. 08/01/19   Clapacs, Madie Reno, MD  citalopram (CELEXA) 20 MG tablet Take 1 tablet (20 mg total) by mouth daily. 12/21/19   Sable Feil, PA-C  citalopram (CELEXA) 20 MG tablet Take 1 tablet (20 mg total) by mouth daily. 12/21/19 12/20/20  Sable Feil, PA-C  cloNIDine (CATAPRES) 0.1 MG tablet Take 1 tablet (0.1 mg total)  by mouth daily. 12/20/19   Iloabachie, Chioma E, NP  colchicine 0.6 MG tablet Take 1 tablet (0.6 mg total) by mouth daily as needed. 08/01/19   Clapacs, Madie Reno, MD  cyclobenzaprine (FLEXERIL) 10 MG tablet Take 1 tablet (10 mg total) by mouth 3 (three) times daily as needed. 12/21/19   Sable Feil, PA-C  Fluticasone-Salmeterol (ADVAIR DISKUS) 100-50 MCG/DOSE AEPB Inhale 1 puff into the lungs 2 (two) times  daily. 09/01/18   Tukov-Yual, Arlyss Gandy, NP  folic acid (FOLVITE) 1 MG tablet Take 1 tablet (1 mg total) by mouth daily. 10/26/19   Iloabachie, Chioma E, NP  hydrOXYzine (ATARAX/VISTARIL) 25 MG tablet Take 1 tablet (25 mg total) by mouth 3 (three) times daily as needed for anxiety. 08/01/19   Clapacs, Madie Reno, MD  ibuprofen (ADVIL) 600 MG tablet Take 1 tablet (600 mg total) by mouth every 8 (eight) hours as needed. 12/21/19   Sable Feil, PA-C  lisinopril (ZESTRIL) 40 MG tablet Take 1 tablet (40 mg total) by mouth daily. 10/26/19   Iloabachie, Chioma E, NP  Multiple Vitamins-Iron (MULTIVITAMINS WITH IRON) TABS tablet Take 1 tablet by mouth daily. 08/01/19   Clapacs, Madie Reno, MD  pantoprazole (PROTONIX) 40 MG tablet Take 1 tablet (40 mg total) by mouth daily. 10/26/19   Iloabachie, Chioma E, NP  QUEtiapine (SEROQUEL) 50 MG tablet Take 1 tablet (50 mg total) by mouth at bedtime. 10/14/19   Patrecia Pour, NP  thiamine 250 MG tablet Take 1 tablet (250 mg total) by mouth daily. 08/01/19   Clapacs, Madie Reno, MD    Allergies Percocet [oxycodone-acetaminophen]  Family History  Problem Relation Age of Onset  . Alcohol abuse Father   . Breast cancer Mother 68    Social History Social History   Tobacco Use  . Smoking status: Former Smoker    Types: Cigarettes  . Smokeless tobacco: Never Used  . Tobacco comment: quit 30 years ago  Substance Use Topics  . Alcohol use: Yes    Alcohol/week: 12.0 standard drinks    Types: 12 Shots of liquor per week    Comment: pint daily  . Drug use: No    Review of Systems Constitutional: No fever/chills Eyes: No visual changes. ENT: No sore throat. Cardiovascular: Denies chest pain. Respiratory: Denies shortness of breath. Gastrointestinal: No abdominal pain.  No nausea, no vomiting.  No diarrhea.  No constipation. Genitourinary: Negative for dysuria. Musculoskeletal: Right shoulder pain. Skin: Negative for rash. Neurological: Negative for headaches,  focal weakness or numbness. Psychiatric:  Alcohol abuse and anxiety. Endocrine:  Gout and hypertension. Hematological/Lymphatic:  Allergic/Immunilogical: Percocet ____________________________________________   PHYSICAL EXAM:  VITAL SIGNS: ED Triage Vitals  Enc Vitals Group     BP 12/21/19 1417 (!) 146/88     Pulse Rate 12/21/19 1417 88     Resp 12/21/19 1417 16     Temp 12/21/19 1417 98.6 F (37 C)     Temp Source 12/21/19 1417 Oral     SpO2 12/21/19 1417 100 %     Weight 12/21/19 1418 185 lb (83.9 kg)     Height 12/21/19 1418 6' (1.829 m)     Head Circumference --      Peak Flow --      Pain Score 12/21/19 1418 8     Pain Loc --      Pain Edu? --      Excl. in [place]? --    Constitutional: Alert and oriented. Well appearing and  in no acute distress. Eyes: Conjunctivae are normal. PERRL. EOMI. Head: Atraumatic. Nose: No congestion/rhinnorhea. Mouth/Throat: Mucous membranes are moist.  Oropharynx non-erythematous. Neck: No cervical spine tenderness to palpation. Hematological/Lymphatic/Immunilogical: No cervical lymphadenopathy. Cardiovascular: Normal rate, regular rhythm. Grossly normal heart sounds.  Good peripheral circulation. Respiratory: Normal respiratory effort.  No retractions. Lungs CTAB. Musculoskeletal: No obvious deformity to the right shoulder.  Patient has decreased range of motion with adduction and overhead reaching.  Patient has moderate guarding palpation of the [facility] GH joint.   Neurologic:  Normal speech and language. No gross focal neurologic deficits are appreciated. No gait instability. Skin:  Skin is warm, dry and intact. No rash noted.  No edema or erythema. Psychiatric: Mood and affect are normal. Speech and behavior are normal.  ____________________________________________   LABS (all labs ordered are listed, but only abnormal results are displayed)  Labs Reviewed - No data to  display ____________________________________________  EKG   ____________________________________________  RADIOLOGY  ED MD interpretation:    Official radiology report(s): DG Shoulder Right  Result Date: 12/21/2019 CLINICAL DATA:  Right shoulder pain. EXAM: RIGHT SHOULDER - 2+ VIEW COMPARISON:  None. FINDINGS: There is no evidence of fracture or dislocation. There is no evidence of arthropathy or other focal bone abnormality. Soft tissues are unremarkable. IMPRESSION: Negative. Electronically Signed   By: Marijo Conception M.D.   On: 12/21/2019 15:19    ____________________________________________   PROCEDURES  Procedure(s) performed (including Critical Care):  Procedures   ____________________________________________   INITIAL IMPRESSION / ASSESSMENT AND PLAN / ED COURSE  As part of my medical decision making, I reviewed the following data within the [facility]     Patient complains of 2 days of increasing right shoulder pain.  Knee x-ray findings with patient.  Patient physical exam consistent with strain and AC joint.  Patient given discharge care instruction advised take medication as directed.  Patient advised follow-up PCP.    Miguel [name] was evaluated in Emergency Department on 12/21/2019 for the symptoms described in the history of present illness. He was evaluated in the context of the global COVID-19 pandemic, which necessitated consideration that the patient might be at risk for infection with the SARS-CoV-2 virus that causes COVID-19. Institutional protocols and algorithms that pertain to the evaluation of patients at risk for COVID-19 are in a state of rapid change based on information released by regulatory bodies including the CDC and federal and state organizations. These policies and algorithms were followed during the patient's care in the ED.       ____________________________________________   FINAL CLINICAL IMPRESSION(S) / ED  DIAGNOSES  Final diagnoses:  Sprain of right acromioclavicular ligament, initial encounter     ED Discharge Orders         Ordered    citalopram (CELEXA) 20 MG tablet  Daily     12/21/19 1453    cyclobenzaprine (FLEXERIL) 10 MG tablet  3 times daily PRN     12/21/19 1535    ibuprofen (ADVIL) 600 MG tablet  Every 8 hours PRN     12/21/19 1535    citalopram (CELEXA) 20 MG tablet  Daily     12/21/19 1535           Note:  This document was prepared using Dragon voice recognition software and may include unintentional dictation errors.    Sable Feil, PA-C 12/21/19 1547    Lilia Pro., MD 12/21/19 [phone]

## 2020-01-08 ENCOUNTER — Other Ambulatory Visit: Payer: Self-pay | Admitting: Gerontology

## 2020-01-08 DIAGNOSIS — K219 Gastro-esophageal reflux disease without esophagitis: Secondary | ICD-10-CM

## 2020-01-08 DIAGNOSIS — F1010 Alcohol abuse, uncomplicated: Secondary | ICD-10-CM

## 2020-01-10 ENCOUNTER — Telehealth: Payer: Self-pay | Admitting: Pharmacy Technician

## 2020-01-10 NOTE — Telephone Encounter (Signed)
Received 2021 proof of income.  Patient eligible to receive medication assistance at [facility] until time to re-certify in 2022, and as long as eligibility requirements continue to be met.   J.  Care Manager [facility] 

## 2020-01-31 ENCOUNTER — Ambulatory Visit: Payer: Medicaid Other | Admitting: Gerontology

## 2020-02-07 IMAGING — CT CT ABDOMEN AND PELVIS WITH CONTRAST
2 of 5 series · 16 of 46 positions shown, 18 images · IV contrast (omnipaque)
Comparison: CT abdomen pelvis 03/08/2019

CLINICAL DATA: Fall with abdominal trauma

EXAM:
CT ABDOMEN AND PELVIS WITH CONTRAST
TECHNIQUE: Multidetector CT imaging of the abdomen and pelvis was performed
using the standard protocol following bolus administration of
intravenous contrast.
CONTRAST:  100mL OMNIPAQUE IOHEXOL 300 MG/ML  SOLN

[Series 2: routine abd/pel with · axial · 0.78mm/px · z∈[-678,-223]mm · 13 of 103 slices shown, 15 images]
[im 6/103  soft-tissue]
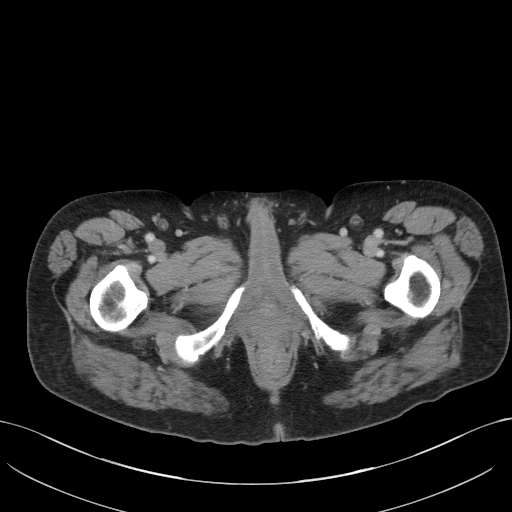
[im 6/103  bone]
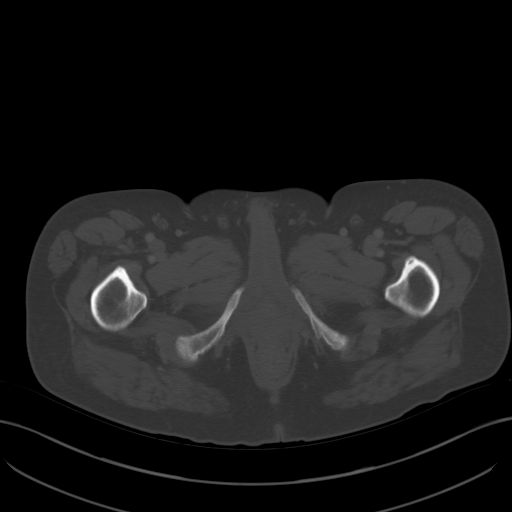
[im 16/103  soft-tissue]
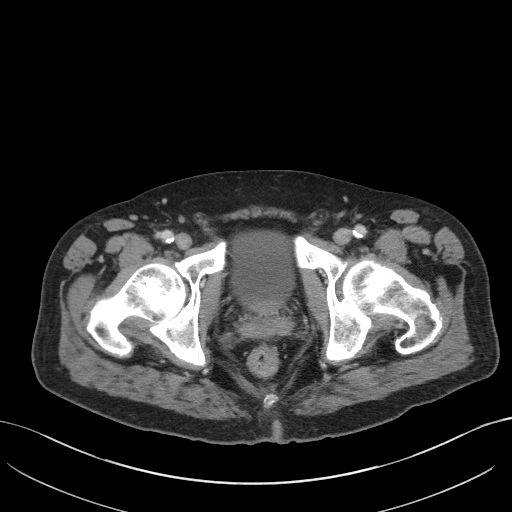
[im 21/103  soft-tissue]
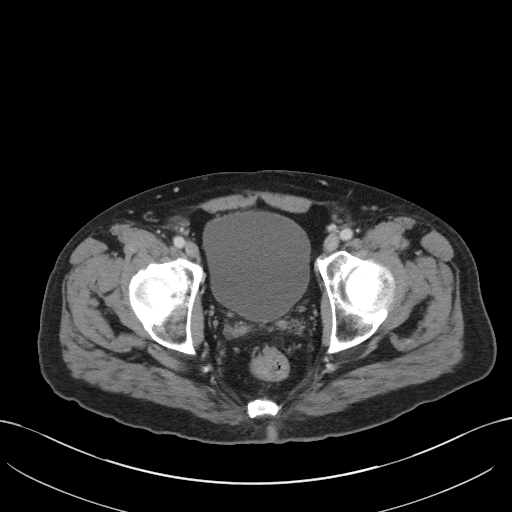
[im 31/103  soft-tissue]
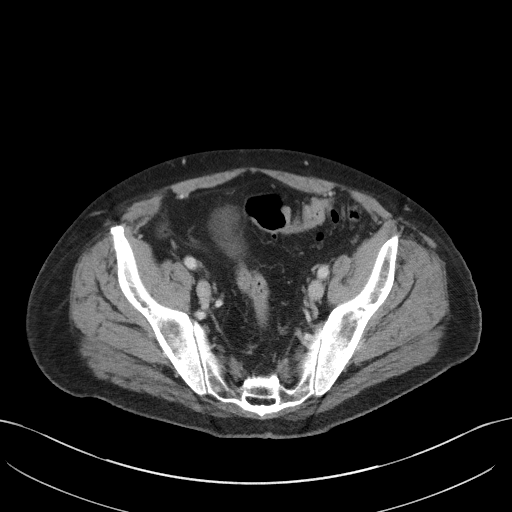
[im 36/103  soft-tissue]
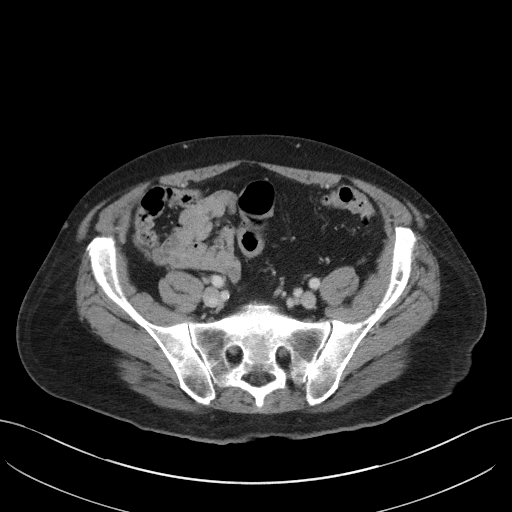
[im 46/103  soft-tissue]
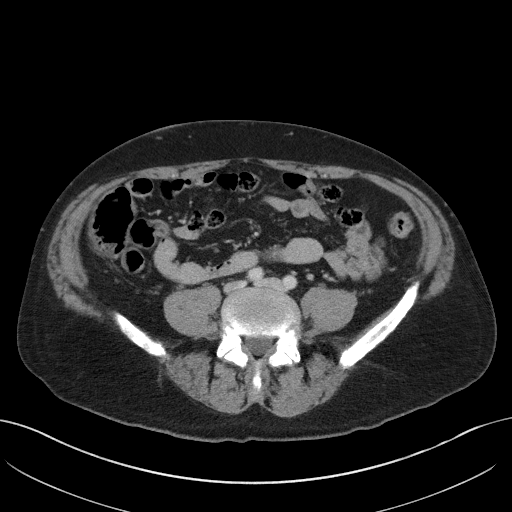
[im 52/103  soft-tissue]
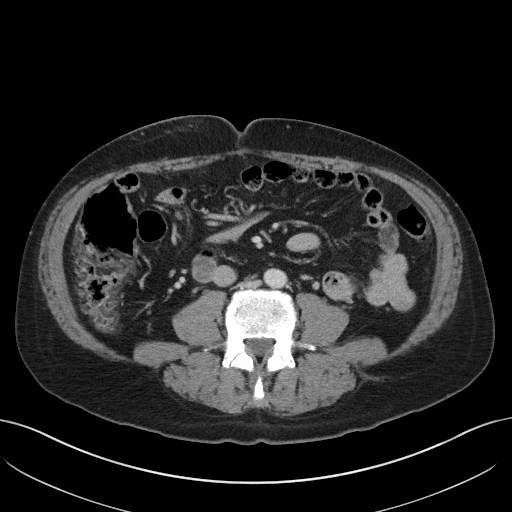
[im 57/103  soft-tissue]
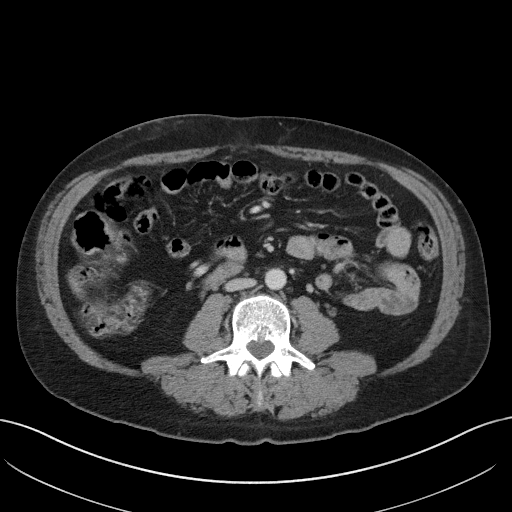
[im 67/103  soft-tissue]
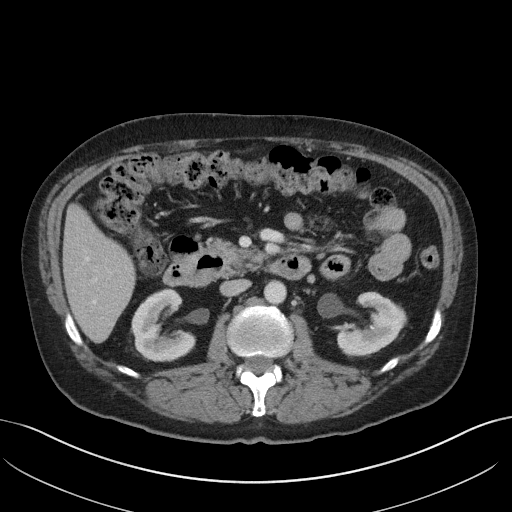
[im 67/103  bone]
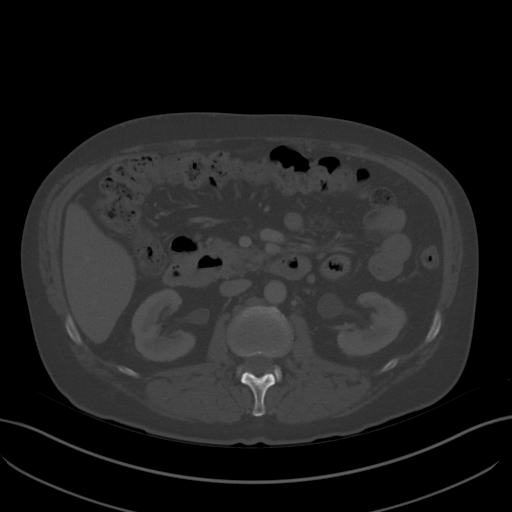
[im 72/103  soft-tissue]
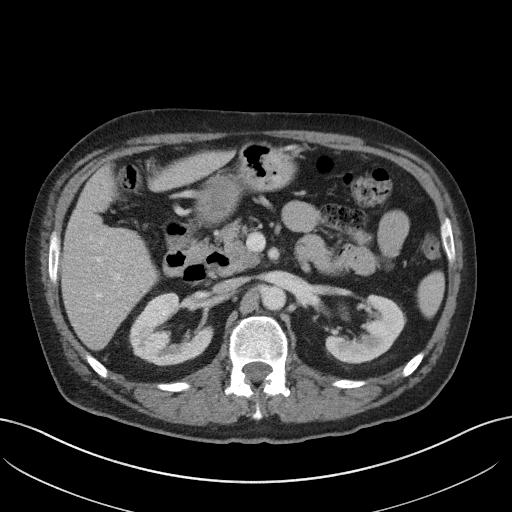
[im 82/103  soft-tissue]
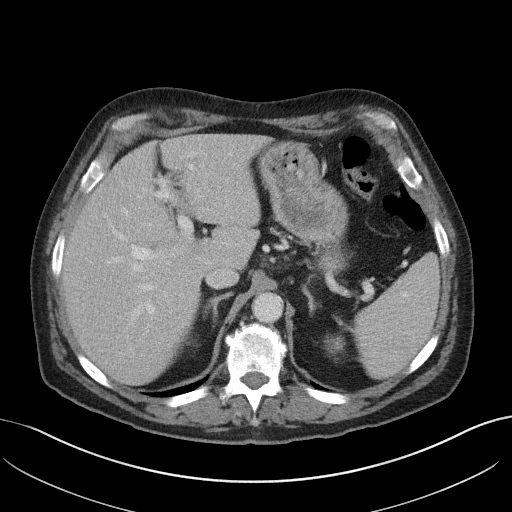
[im 87/103  soft-tissue]
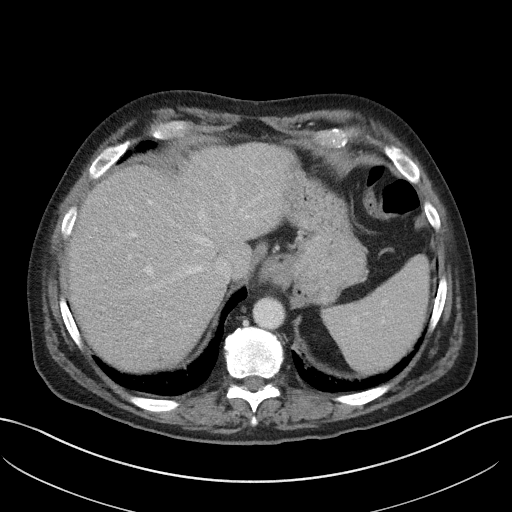
[im 97/103  soft-tissue]
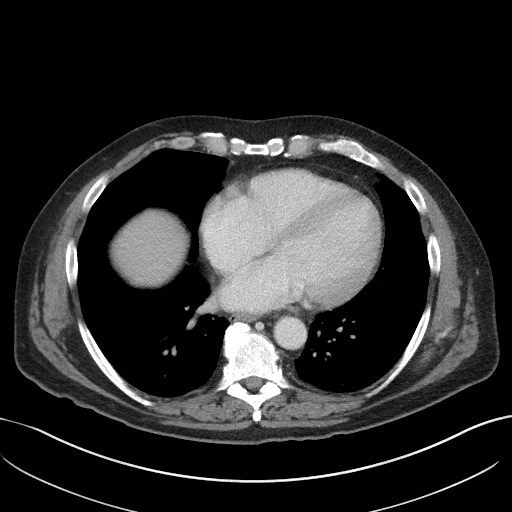

[Series 5: coronal st · coronal · 0.79mm/px · 3 of 87 slices shown]
[im 29/87  soft-tissue]
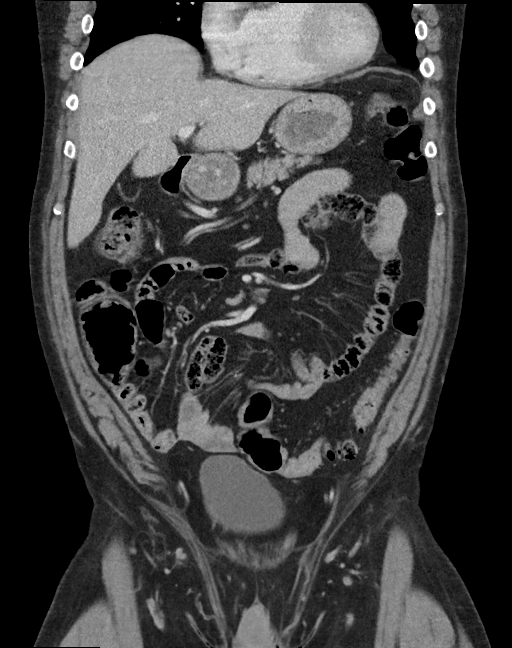
[im 39/87  soft-tissue]
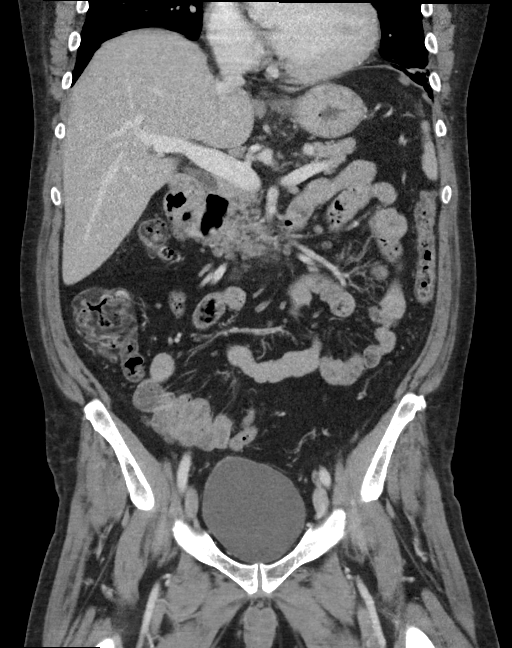
[im 48/87  soft-tissue]
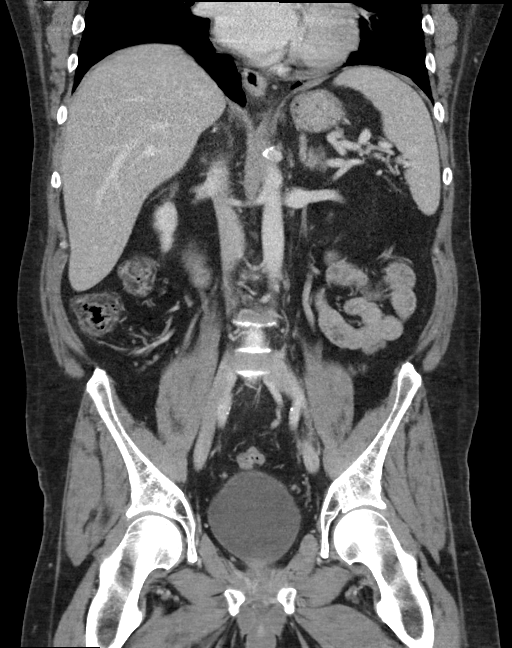

[16 of 46 positions shown; findings below may reference images not displayed]

FINDINGS: LOWER CHEST: There is no basilar pleural or apical pericardial
effusion.

HEPATOBILIARY: The hepatic contours and density are normal. There is
no intra- or extrahepatic biliary dilatation. The gallbladder is
normal.

PANCREAS: The pancreatic parenchymal contours are normal and there
is no ductal dilatation. There is no peripancreatic fluid
collection.

SPLEEN: Normal.

ADRENALS/URINARY TRACT:

--Adrenal glands: Normal.

--Right kidney/ureter: Multiple nonobstructing renal calculi,
measuring up to 5 mm. No hydronephrosis, perinephric stranding or
solid renal mass.

--Left kidney/ureter: Multiple nonobstructing renal calculi,
measuring up to mm. No hydronephrosis, perinephric stranding or
solid renal mass.

--Urinary bladder: Normal for degree of distention

STOMACH/BOWEL:

--Stomach/Duodenum: There is no hiatal hernia or other gastric
abnormality. The duodenal course and caliber are normal.

--Small bowel: No dilatation or inflammation.

--Colon: No focal abnormality.

--Appendix: Normal.

VASCULAR/LYMPHATIC: Normal course and caliber of the major abdominal
vessels. No abdominal or pelvic lymphadenopathy.

REPRODUCTIVE: Enlarged prostate measures 5.1 cm in transverse
dimension.

MUSCULOSKELETAL. No bony spinal canal stenosis or focal osseous
abnormality. Healing posterior right twelfth rib fracture.

OTHER: None.
IMPRESSION: 1. No acute abnormality of the abdomen or pelvis.
2. Bilateral nonobstructive nephrolithiasis.
3. Healing fracture of the posterior right twelfth rib.

## 2020-02-08 ENCOUNTER — Telehealth: Payer: Self-pay | Admitting: Pharmacist

## 2020-02-08 ENCOUNTER — Ambulatory Visit: Payer: Medicaid Other | Admitting: Gerontology

## 2020-02-08 NOTE — Telephone Encounter (Signed)
02/08/2020 4:14:55 PM - Ventolin refill online with [facility]  -- Elmer Picker - Thursday, February 08, 2020 4:14 PM --Placed refill online with [facility] for Ventolin, to ship 02/21/2020, Order# [id number].

## 2020-02-14 ENCOUNTER — Encounter: Payer: Self-pay | Admitting: *Deleted

## 2020-02-14 ENCOUNTER — Emergency Department
Admission: EM | Admit: 2020-02-14 | Discharge: 2020-02-15 | Disposition: A | Discharge status: Home / Self Care | Payer: Self-pay | Attending: Student in an Organized Health Care Education/Training Program | Admitting: Student in an Organized Health Care Education/Training Program

## 2020-02-14 ENCOUNTER — Emergency Department: Payer: Self-pay

## 2020-02-14 ENCOUNTER — Other Ambulatory Visit: Payer: Self-pay

## 2020-02-14 DIAGNOSIS — F1010 Alcohol abuse, uncomplicated: Secondary | ICD-10-CM | POA: Diagnosis present

## 2020-02-14 DIAGNOSIS — Y92009 Unspecified place in unspecified non-institutional (private) residence as the place of occurrence of the external cause: Secondary | ICD-10-CM | POA: Diagnosis present

## 2020-02-14 DIAGNOSIS — F10230 Alcohol dependence with withdrawal, uncomplicated: Secondary | ICD-10-CM | POA: Diagnosis present

## 2020-02-14 DIAGNOSIS — R000 Tachycardia, unspecified: Secondary | ICD-10-CM | POA: Diagnosis present

## 2020-02-14 DIAGNOSIS — Z87891 Personal history of nicotine dependence: Secondary | ICD-10-CM | POA: Insufficient documentation

## 2020-02-14 DIAGNOSIS — R0789 Other chest pain: Secondary | ICD-10-CM | POA: Diagnosis present

## 2020-02-14 DIAGNOSIS — F411 Generalized anxiety disorder: Secondary | ICD-10-CM | POA: Diagnosis present

## 2020-02-14 DIAGNOSIS — F329 Major depressive disorder, single episode, unspecified: Secondary | ICD-10-CM | POA: Insufficient documentation

## 2020-02-14 DIAGNOSIS — Z0000 Encounter for general adult medical examination without abnormal findings: Secondary | ICD-10-CM

## 2020-02-14 DIAGNOSIS — X789XXA Intentional self-harm by unspecified sharp object, initial encounter: Secondary | ICD-10-CM | POA: Diagnosis present

## 2020-02-14 DIAGNOSIS — F331 Major depressive disorder, recurrent, moderate: Secondary | ICD-10-CM | POA: Diagnosis present

## 2020-02-14 DIAGNOSIS — Z046 Encounter for general psychiatric examination, requested by authority: Secondary | ICD-10-CM | POA: Insufficient documentation

## 2020-02-14 DIAGNOSIS — I10 Essential (primary) hypertension: Secondary | ICD-10-CM | POA: Insufficient documentation

## 2020-02-14 DIAGNOSIS — F332 Major depressive disorder, recurrent severe without psychotic features: Secondary | ICD-10-CM | POA: Diagnosis present

## 2020-02-14 DIAGNOSIS — N179 Acute kidney failure, unspecified: Secondary | ICD-10-CM | POA: Diagnosis present

## 2020-02-14 DIAGNOSIS — J45909 Unspecified asthma, uncomplicated: Secondary | ICD-10-CM | POA: Insufficient documentation

## 2020-02-14 DIAGNOSIS — Z23 Encounter for immunization: Secondary | ICD-10-CM | POA: Insufficient documentation

## 2020-02-14 DIAGNOSIS — K219 Gastro-esophageal reflux disease without esophagitis: Secondary | ICD-10-CM | POA: Diagnosis present

## 2020-02-14 DIAGNOSIS — Y9389 Activity, other specified: Secondary | ICD-10-CM | POA: Insufficient documentation

## 2020-02-14 DIAGNOSIS — Y908 Blood alcohol level of 240 mg/100 ml or more: Secondary | ICD-10-CM | POA: Insufficient documentation

## 2020-02-14 DIAGNOSIS — Y92810 Car as the place of occurrence of the external cause: Secondary | ICD-10-CM | POA: Insufficient documentation

## 2020-02-14 DIAGNOSIS — S61512A Laceration without foreign body of left wrist, initial encounter: Secondary | ICD-10-CM | POA: Diagnosis present

## 2020-02-14 DIAGNOSIS — F1020 Alcohol dependence, uncomplicated: Secondary | ICD-10-CM | POA: Diagnosis present

## 2020-02-14 DIAGNOSIS — F1994 Other psychoactive substance use, unspecified with psychoactive substance-induced mood disorder: Secondary | ICD-10-CM | POA: Diagnosis present

## 2020-02-14 DIAGNOSIS — Y999 Unspecified external cause status: Secondary | ICD-10-CM | POA: Insufficient documentation

## 2020-02-14 DIAGNOSIS — M25561 Pain in right knee: Secondary | ICD-10-CM | POA: Diagnosis present

## 2020-02-14 DIAGNOSIS — F10920 Alcohol use, unspecified with intoxication, uncomplicated: Secondary | ICD-10-CM | POA: Diagnosis present

## 2020-02-14 DIAGNOSIS — S51812A Laceration without foreign body of left forearm, initial encounter: Secondary | ICD-10-CM | POA: Insufficient documentation

## 2020-02-14 DIAGNOSIS — S61519A Laceration without foreign body of unspecified wrist, initial encounter: Secondary | ICD-10-CM | POA: Diagnosis present

## 2020-02-14 DIAGNOSIS — N63 Unspecified lump in unspecified breast: Secondary | ICD-10-CM | POA: Diagnosis present

## 2020-02-14 DIAGNOSIS — M109 Gout, unspecified: Secondary | ICD-10-CM | POA: Diagnosis present

## 2020-02-14 DIAGNOSIS — S8010XA Contusion of unspecified lower leg, initial encounter: Secondary | ICD-10-CM | POA: Diagnosis present

## 2020-02-14 DIAGNOSIS — G8929 Other chronic pain: Secondary | ICD-10-CM | POA: Diagnosis present

## 2020-02-14 DIAGNOSIS — Z79899 Other long term (current) drug therapy: Secondary | ICD-10-CM | POA: Insufficient documentation

## 2020-02-14 DIAGNOSIS — F10929 Alcohol use, unspecified with intoxication, unspecified: Secondary | ICD-10-CM

## 2020-02-14 DIAGNOSIS — R4588 Nonsuicidal self-harm: Secondary | ICD-10-CM | POA: Diagnosis present

## 2020-02-14 DIAGNOSIS — X788XXA Intentional self-harm by other sharp object, initial encounter: Secondary | ICD-10-CM | POA: Insufficient documentation

## 2020-02-14 DIAGNOSIS — F10930 Alcohol use, unspecified with withdrawal, uncomplicated: Secondary | ICD-10-CM | POA: Diagnosis present

## 2020-02-14 DIAGNOSIS — F10220 Alcohol dependence with intoxication, uncomplicated: Secondary | ICD-10-CM | POA: Diagnosis present

## 2020-02-14 DIAGNOSIS — F1014 Alcohol abuse with alcohol-induced mood disorder: Secondary | ICD-10-CM | POA: Insufficient documentation

## 2020-02-14 DIAGNOSIS — A419 Sepsis, unspecified organism: Secondary | ICD-10-CM | POA: Diagnosis present

## 2020-02-14 DIAGNOSIS — K7030 Alcoholic cirrhosis of liver without ascites: Secondary | ICD-10-CM | POA: Diagnosis present

## 2020-02-14 DIAGNOSIS — F429 Obsessive-compulsive disorder, unspecified: Secondary | ICD-10-CM | POA: Diagnosis present

## 2020-02-14 DIAGNOSIS — Z7289 Other problems related to lifestyle: Secondary | ICD-10-CM | POA: Insufficient documentation

## 2020-02-14 DIAGNOSIS — F32A Depression, unspecified: Secondary | ICD-10-CM

## 2020-02-14 LAB — CBC WITH DIFFERENTIAL/PLATELET
Abs Immature Granulocytes: 0.02 10*3/uL (ref 0.00–0.07)
Basophils Absolute: 0.1 10*3/uL (ref 0.0–0.1)
Basophils Relative: 1 %
Eosinophils Absolute: 0.2 10*3/uL (ref 0.0–0.5)
Eosinophils Relative: 2 %
HCT: 44.9 % (ref 39.0–52.0)
Hemoglobin: 14.1 g/dL (ref 13.0–17.0)
Immature Granulocytes: 0 %
Lymphocytes Relative: 38 %
Lymphs Abs: 2.7 10*3/uL (ref 0.7–4.0)
MCH: 29.3 pg (ref 26.0–34.0)
MCHC: 31.4 g/dL (ref 30.0–36.0)
MCV: 93.2 fL (ref 80.0–100.0)
Monocytes Absolute: 0.6 10*3/uL (ref 0.1–1.0)
Monocytes Relative: 8 %
Neutro Abs: 3.6 10*3/uL (ref 1.7–7.7)
Neutrophils Relative %: 51 %
Platelets: 197 10*3/uL (ref 150–400)
RBC: 4.82 MIL/uL (ref 4.22–5.81)
RDW: 16.3 % — ABNORMAL HIGH (ref 11.5–15.5)
WBC: 7.1 10*3/uL (ref 4.0–10.5)
nRBC: 0 % (ref 0.0–0.2)

## 2020-02-14 LAB — ACETAMINOPHEN LEVEL: Acetaminophen (Tylenol), Serum: <10 ug/mL — ABNORMAL LOW (ref 10–30)

## 2020-02-14 LAB — COMPREHENSIVE METABOLIC PANEL
ALT: 21 U/L (ref 0–44)
AST: 20 U/L (ref 15–41)
Albumin: 4.4 g/dL (ref 3.5–5.0)
Alkaline Phosphatase: 86 U/L (ref 38–126)
Anion gap: 12 (ref 5–15)
BUN: 13 mg/dL (ref 6–20)
CO2: 27 mmol/L (ref 22–32)
Calcium: 9.2 mg/dL (ref 8.9–10.3)
Chloride: 107 mmol/L (ref 98–111)
Creatinine, Ser: 0.89 mg/dL (ref 0.61–1.24)
GFR calc Af Amer: >60 mL/min (ref >60)
GFR calc non Af Amer: >60 mL/min (ref >60)
Glucose, Bld: 105 mg/dL — ABNORMAL HIGH (ref 70–99)
Potassium: 3.5 mmol/L (ref 3.5–5.1)
Sodium: 146 mmol/L — ABNORMAL HIGH (ref 135–145)
Total Bilirubin: 0.5 mg/dL (ref 0.3–1.2)
Total Protein: 7.9 g/dL (ref 6.5–8.1)

## 2020-02-14 LAB — SALICYLATE LEVEL: Salicylate Lvl: <7 mg/dL — ABNORMAL LOW (ref 7.0–30.0)

## 2020-02-14 LAB — ETHANOL: Alcohol, Ethyl (B): 327 mg/dL (ref <10)

## 2020-02-14 MED ORDER — THIAMINE HCL 100 MG/ML IJ SOLN: 100.0000 mg | Freq: Every day | INTRAMUSCULAR | Status: DC

## 2020-02-14 MED ORDER — TETANUS-DIPHTH-ACELL PERTUSSIS 5-2.5-18.5 LF-MCG/0.5 IM SUSP
0.5000 mL | Freq: Once | INTRAMUSCULAR | Status: AC
  Administered 2020-02-14: 0.5 mL via INTRAMUSCULAR
  Filled 2020-02-14: qty 0.5

## 2020-02-14 MED ORDER — THIAMINE HCL 100 MG PO TABS: 100.0000 mg | ORAL_TABLET | Freq: Every day | ORAL | Status: DC

## 2020-02-14 MED ORDER — LORAZEPAM 2 MG PO TABS: 0.0000 mg | ORAL_TABLET | Freq: Two times a day (BID) | ORAL | Status: DC

## 2020-02-14 MED ORDER — LORAZEPAM 2 MG/ML IJ SOLN: 0.0000 mg | Freq: Two times a day (BID) | INTRAMUSCULAR | Status: DC

## 2020-02-14 MED ORDER — LORAZEPAM 2 MG PO TABS
0.0000 mg | ORAL_TABLET | Freq: Four times a day (QID) | ORAL | Status: DC
  Administered 2020-02-15: 2 mg via ORAL
  Administered 2020-02-15: 1 mg via ORAL
  Filled 2020-02-14 (×2): qty 1

## 2020-02-14 MED ORDER — LORAZEPAM 2 MG/ML IJ SOLN: 0.0000 mg | Freq: Four times a day (QID) | INTRAMUSCULAR | Status: DC

## 2020-02-14 NOTE — BH Assessment (Signed)
Assessment Note  Miguel [name] is an 59 y.o. male. Miguel [name] arrived to the ED by way of EMS. He reports that he came to the ED because he "had problems with his arm". He stated, "I just got cut" He shared that he is unsure as to how he got cut.  He denied symptoms of depression. He reports that he is "always anxious". He denied having auditory or visual hallucinations.  He denied suicidal ideation or intent.  He denied homicidal ideation or intent.  He reports that he has been facing stressors. He has gone without his anxiety medication, his wife's illness, and unemployment.  He denied the use of alcohol or drug use.    Miguel [name] did not appear to be forthright with the assessor.  Miguel [name] BAC resulted at 85.  Miguel [name] continues to be non cooperative with the hospital staff. He is demanding to use the phone to call his wife and a Chief Executive Officer.  Diagnosis: Alcohol induced mood disorder  Past Medical History:  Past Medical History:  Diagnosis Date  . Alcohol abuse   . Anxiety   . Asthma   . GERD (gastroesophageal reflux disease)   . Gout   . Hypertension   . Kidney stone   . OCD (obsessive compulsive disorder)   . Renal colic     Past Surgical History:  Procedure Laterality Date  . CHOLECYSTECTOMY  2012  . EXTRACORPOREAL SHOCK WAVE LITHOTRIPSY Left 01/12/2019   Procedure: EXTRACORPOREAL SHOCK WAVE LITHOTRIPSY (ESWL);  Surgeon: Billey Co, MD;  Location: [facility] ORS;  Service: Urology;  Laterality: Left;    Family History:  Family History  Problem Relation Age of Onset  . Alcohol abuse Father   . Breast cancer Mother 10    Social History:  reports that he has quit smoking. His smoking use included cigarettes. He has never used smokeless tobacco. He reports current alcohol use of about 12.0 standard drinks of alcohol per week. He reports that he does not use drugs.  Additional Social History:     CIWA: CIWA-Ar BP: 131/85 Pulse Rate: 91 COWS:    Allergies:  Allergies   Allergen Reactions  . Percocet [Oxycodone-Acetaminophen] Nausea And Vomiting    Home Medications: (Not in a hospital admission)   OB/GYN Status:  No LMP for male patient.  General Assessment Data Location of Assessment: [facility] ED TTS Assessment: In system Is this a Tele or Face-to-Face Assessment?: Face-to-Face Is this an Initial Assessment or a Re-assessment for this encounter?: Initial Assessment Patient Accompanied by:: N/A Language Other than English: No Living Arrangements: Other (Comment)(Private residence) What gender do you identify as?: Male Marital status: Married Living Arrangements: Spouse/significant other Can pt return to current living arrangement?: Yes Admission Status: Involuntary Petitioner: ED Attending Is patient capable of signing voluntary admission?: No Referral Source: Self/Family/Friend Insurance type: None  Medical Screening Exam ([place]) Medical Exam completed: Yes  Crisis Care Plan Living Arrangements: Spouse/significant other Legal Guardian: Other:(Self) Name of Psychiatrist: None Name of Therapist: None  Education Status Is patient currently in school?: No Is the patient employed, unemployed or receiving disability?: Unemployed  Risk to self with the past 6 months Suicidal Ideation: No Has patient been a risk to self within the past 6 months prior to admission? : Yes Suicidal Intent: No Has patient had any suicidal intent within the past 6 months prior to admission? : No Is patient at risk for suicide?: No Suicidal Plan?: No Has patient had any suicidal  plan within the past 6 months prior to admission? : No Access to Means: No What has been your use of drugs/alcohol within the last 12 months?: Use of alcohol Previous Attempts/Gestures: (Unknown) How many times?: (Unknown) Other Self Harm Risks: Cutting Triggers for Past Attempts: Unpredictable Intentional Self Injurious Behavior: Cutting Comment - Self Injurious Behavior:  Cuts to left arm Family Suicide History: Unknown Recent stressful life event(s): Other (Comment), Financial Problems(illness of wife, ) Persecutory voices/beliefs?: No Depression: (Denied by patient) Depression Symptoms: (intoxicated) Substance abuse history and/or treatment for substance abuse?: Yes Suicide prevention information given to non-admitted patients: Not applicable  Risk to Others within the past 6 months Homicidal Ideation: No Does patient have any lifetime risk of violence toward others beyond the six months prior to admission? : No Thoughts of Harm to Others: No Current Homicidal Intent: No Current Homicidal Plan: No Access to Homicidal Means: No Identified Victim: None identified History of harm to others?: No Assessment of Violence: None Noted Does patient have access to weapons?: No Criminal Charges Pending?: No Does patient have a court date: No Is patient on probation?: No  Psychosis Hallucinations: None noted Delusions: None noted  Mental Status Report Appearance/Hygiene: Unremarkable(Refused to change into scrubs) Eye Contact: Fair Motor Activity: Restlessness Speech: Slurred, Aggressive, Argumentative Level of Consciousness: Alert Mood: Irritable, Angry Affect: Irritable Anxiety Level: None Thought Processes: Unable to Assess(intoxicated) Judgement: Impaired Orientation: Appropriate for developmental age Obsessive Compulsive Thoughts/Behaviors: None  Cognitive Functioning Concentration: Poor Memory: Unable to Assess Is patient IDD: No Insight: Poor Impulse Control: Poor Appetite: Fair Have you had any weight changes? : No Change Sleep: Unable to Assess Vegetative Symptoms: None  ADLScreening [facility] Assessment Services) Patient's cognitive ability adequate to safely complete daily activities?: Yes Patient able to express need for assistance with ADLs?: Yes Independently performs ADLs?: Yes (appropriate for developmental age)  Prior  Inpatient Therapy Prior Inpatient Therapy: Yes Prior Therapy Dates: (Refused to answer) Prior Therapy Facilty/Provider(s): (refused to asnwer) Reason for Treatment: (refused to answer)  Prior Outpatient Therapy Prior Outpatient Therapy: (Unknown)  ADL Screening (condition at time of admission) Patient's cognitive ability adequate to safely complete daily activities?: Yes Patient able to express need for assistance with ADLs?: Yes Independently performs ADLs?: Yes (appropriate for developmental age)             Regulatory affairs officer (For Healthcare) Does Patient Have a Medical Advance Directive?: No Would patient like information on creating a medical advance directive?: No - Patient declined          Disposition:  Disposition Initial Assessment Completed for this Encounter: Yes  On Site Evaluation by:   Reviewed with Physician:    Elmer Bales 02/14/2020 10:22 PM

## 2020-02-14 NOTE — ED Notes (Signed)
Due to what patient has stated tonight, pt. Acuity changed from level 4 to level 2.

## 2020-02-14 NOTE — ED Triage Notes (Signed)
Pt to ED via EMS reportign he does not remember how he cut himself on his left forearm the patient continuously states, "I have beend doing so well" "I was not going to come back here this soon."   Pt reports he has been sitting in his car all day listening to music and drinking and the next thing her knew he was coming to the hospital after cutting himself with a "letter opener" pt denies thoughts of hurting self or others now and admits to drinking today.   Pt unable to sit still in triage.

## 2020-02-14 NOTE — ED Notes (Signed)
Pt. Moved from 19 Hallway bed to quad #21.  Pt. Is very intoxicated, pt. Refusing to change into hospital attire, pt. Refusing blood work.  Pt. States "I want a breathalizer"  Pt. States "I want to go to jail".

## 2020-02-14 NOTE — ED Notes (Signed)
TTS unable to assess at this time. Patient was intoxicated and belligerent. He would not cooperate with hospital staff at this time.

## 2020-02-14 NOTE — ED Provider Notes (Signed)
[facility] Emergency Department Provider Note    First MD Initiated Contact with Patient 02/14/20 2043     (approximate)  I have reviewed the triage vital signs and the nursing notes.   HISTORY  Chief Complaint Laceration    HPI Miguel [name] is a 59 y.o. male with extensive history of alcohol abuse presents to the ER for evaluation of laceration of his left forearm.  Patient evasive appears intoxicated not providing much history other than being very tearful stating he does not want to be here.  Does admit to cutting himself.  States he feels lonely and hopeless.  States he feels depressed.  States that he is not doing "so good.  "    Past Medical History:  Diagnosis Date  . Alcohol abuse   . Anxiety   . Asthma   . GERD (gastroesophageal reflux disease)   . Gout   . Hypertension   . Kidney stone   . OCD (obsessive compulsive disorder)   . Renal colic    Family History  Problem Relation Age of Onset  . Alcohol abuse Father   . Breast cancer Mother 63   Past Surgical History:  Procedure Laterality Date  . CHOLECYSTECTOMY  2012  . EXTRACORPOREAL SHOCK WAVE LITHOTRIPSY Left 01/12/2019   Procedure: EXTRACORPOREAL SHOCK WAVE LITHOTRIPSY (ESWL);  Surgeon: Billey Co, MD;  Location: [facility] ORS;  Service: Urology;  Laterality: Left;   Patient Active Problem List   Diagnosis Date Noted  . Alcohol abuse with alcohol-induced mood disorder ([place]) 12/02/2019  . Moderate episode of recurrent major depressive disorder ([place])   . Health care maintenance 10/26/2019  . Right knee pain 10/26/2019  . Generalized anxiety disorder 10/14/2019  . MDD (major depressive disorder), recurrent severe, without psychosis ([place]) 07/27/2019  . Social anxiety disorder 07/27/2019  . Left-sided chest wall pain 06/14/2019  . Alcoholic cirrhosis of liver without ascites ([place]) 06/14/2019  . Alcohol abuse 06/14/2019  . AKI (acute kidney injury) ([place]) 05/27/2019  . Alcohol  withdrawal ([place]) 05/07/2019  . Alcoholic intoxication without complication ([place])   . Sepsis ([place]) 03/08/2019  . Breast lump or mass 10/04/2018  . Sepsis secondary to UTI ([place]) 09/14/2018  . Asthma 07/07/2018  . GERD (gastroesophageal reflux disease) 07/07/2018  . OCD (obsessive compulsive disorder) 07/07/2018  . Self-inflicted laceration of wrist ([place]) 03/10/2018  . Leg hematoma 12/25/2017  . Suicide and self-inflicted injury by cutting and piercing instrument ([place]) 03/10/2017  . Severe recurrent major 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 ([place]) 03/09/2017  . Substance induced mood disorder ([place]) 08/15/2016  . Involuntary commitment 08/15/2016  . Alcohol use disorder, severe, dependence ([place]) 02/05/2016  . Hypertension 12/05/2015  . Tachycardia 12/05/2015  . Gout 11/13/2015  . Chronic back pain 05/02/2015      Prior to Admission medications   Medication Sig Start Date End Date Taking? Authorizing Provider  allopurinol (ZYLOPRIM) 300 MG tablet Take 1 tablet (300 mg total) by mouth daily. 10/26/19   Iloabachie, Chioma E, NP  ascorbic acid (VITAMIN C) 500 MG tablet Take 1 tablet (500 mg total) by mouth daily. 08/01/19   Clapacs, Madie Reno, MD  citalopram (CELEXA) 20 MG tablet Take 1 tablet (20 mg total) by mouth daily. 12/21/19   Sable Feil, PA-C  citalopram (CELEXA) 20 MG tablet Take 1 tablet (20 mg total) by mouth daily. 12/21/19 12/20/20  Sable Feil, PA-C  cloNIDine (CATAPRES) 0.1 MG tablet Take 1 tablet (0.1 mg total) by mouth daily.  12/20/19   Iloabachie, Chioma E, NP  colchicine 0.6 MG tablet Take 1 tablet (0.6 mg total) by mouth daily as needed. 08/01/19   Clapacs, Madie Reno, MD  cyclobenzaprine (FLEXERIL) 10 MG tablet Take 1 tablet (10 mg total) by mouth 3 (three) times daily as needed. 12/21/19   Sable Feil, PA-C  Fluticasone-Salmeterol (ADVAIR DISKUS) 100-50 MCG/DOSE AEPB Inhale 1 puff into the lungs 2 (two) times daily. 09/01/18   Tukov-Yual, Arlyss Gandy, NP  folic acid  (FOLVITE) 1 MG tablet Take 1 tablet (1 mg total) by mouth daily. 01/08/20   Iloabachie, Chioma E, NP  hydrOXYzine (ATARAX/VISTARIL) 25 MG tablet Take 1 tablet (25 mg total) by mouth 3 (three) times daily as needed for anxiety. 08/01/19   Clapacs, Madie Reno, MD  ibuprofen (ADVIL) 600 MG tablet Take 1 tablet (600 mg total) by mouth every 8 (eight) hours as needed. 12/21/19   Sable Feil, PA-C  lisinopril (ZESTRIL) 40 MG tablet Take 1 tablet (40 mg total) by mouth daily. 10/26/19   Iloabachie, Chioma E, NP  Multiple Vitamins-Iron (MULTIVITAMINS WITH IRON) TABS tablet Take 1 tablet by mouth daily. 08/01/19   Clapacs, Madie Reno, MD  pantoprazole (PROTONIX) 40 MG tablet Take 1 tablet (40 mg total) by mouth daily. 01/08/20   Iloabachie, Chioma E, NP  QUEtiapine (SEROQUEL) 50 MG tablet Take 1 tablet (50 mg total) by mouth at bedtime. 10/14/19   Patrecia Pour, NP  thiamine 250 MG tablet Take 1 tablet (250 mg total) by mouth daily. 08/01/19   Clapacs, Madie Reno, MD    Allergies Percocet [oxycodone-acetaminophen]    Social History Social History   Tobacco Use  . Smoking status: Former Smoker    Types: Cigarettes  . Smokeless tobacco: Never Used  . Tobacco comment: quit 30 years ago  Substance Use Topics  . Alcohol use: Yes    Alcohol/week: 12.0 standard drinks    Types: 12 Shots of liquor per week    Comment: pint daily  . Drug use: No    Review of Systems Patient denies headaches, rhinorrhea, blurry vision, numbness, shortness of breath, chest pain, edema, cough, abdominal pain, nausea, vomiting, diarrhea, dysuria, fevers, rashes or hallucinations unless otherwise stated above in HPI. ____________________________________________   PHYSICAL EXAM:  VITAL SIGNS: Vitals:   02/14/20 2023  BP: 131/85  Pulse: 91  Resp: 18  Temp: 98.1 F (36.7 C)  SpO2: 97%    Constitutional: Alert, intoxicated appearing.  Eyes: Conjunctivae are normal.  Head: Atraumatic. Nose: No  congestion/rhinnorhea. Mouth/Throat: Mucous membranes are moist.   Neck: No stridor. Painless ROM.  Cardiovascular: Normal rate, regular rhythm. Grossly normal heart sounds.  Good peripheral circulation. Respiratory: Normal respiratory effort.  No retractions. Lungs CTAB. Gastrointestinal: Soft and nontender. No distention. No abdominal bruits. No CVA tenderness. Genitourinary:  Musculoskeletal: No lower extremity tenderness nor edema.  No joint effusions. Neurologic:  Normal speech and language. No gross focal neurologic deficits are appreciated. No facial droop Skin:  Skin is warm, dry and intact. No rash noted.  Superficial linear 3cm  laceration to left forearm is hemostatic at this time. Psychiatric: Mood and affect are normal. Speech and behavior are normal.  ____________________________________________   LABS (all labs ordered are listed, but only abnormal results are displayed)  Results for orders placed or performed during the hospital encounter of 02/14/20 (from the past 24 hour(s))  CBC with Differential/Platelet     Status: Abnormal   Collection Time: 02/14/20  9:54 PM  Result Value Ref Range   WBC 7.1 4.0 - 10.5 K/uL   RBC 4.82 4.22 - 5.81 MIL/uL   Hemoglobin 14.1 13.0 - 17.0 g/dL   HCT 44.9 39.0 - 52.0 %   MCV 93.2 80.0 - 100.0 fL   MCH 29.3 26.0 - 34.0 pg   MCHC 31.4 30.0 - 36.0 g/dL   RDW 16.3 (H) 11.5 - 15.5 %   Platelets 197 150 - 400 K/uL   nRBC 0.0 0.0 - 0.2 %   Neutrophils Relative % 51 %   Neutro Abs 3.6 1.7 - 7.7 K/uL   Lymphocytes Relative 38 %   Lymphs Abs 2.7 0.7 - 4.0 K/uL   Monocytes Relative 8 %   Monocytes Absolute 0.6 0.1 - 1.0 K/uL   Eosinophils Relative 2 %   Eosinophils Absolute 0.2 0.0 - 0.5 K/uL   Basophils Relative 1 %   Basophils Absolute 0.1 0.0 - 0.1 K/uL   Immature Granulocytes 0 %   Abs Immature Granulocytes 0.02 0.00 - 0.07 K/uL  Comprehensive metabolic panel     Status: Abnormal   Collection Time: 02/14/20  9:54 PM  Result Value  Ref Range   Sodium 146 (H) 135 - 145 mmol/L   Potassium 3.5 3.5 - 5.1 mmol/L   Chloride 107 98 - 111 mmol/L   CO2 27 22 - 32 mmol/L   Glucose, Bld 105 (H) 70 - 99 mg/dL   BUN 13 6 - 20 mg/dL   Creatinine, Ser 0.89 0.61 - 1.24 mg/dL   Calcium 9.2 8.9 - 10.3 mg/dL   Total Protein 7.9 6.5 - 8.1 g/dL   Albumin 4.4 3.5 - 5.0 g/dL   AST 20 15 - 41 U/L   ALT 21 0 - 44 U/L   Alkaline Phosphatase 86 38 - 126 U/L   Total Bilirubin 0.5 0.3 - 1.2 mg/dL   GFR calc non Af Amer >60 >60 mL/min   GFR calc Af Amer >60 >60 mL/min   Anion gap 12 5 - 15  Ethanol     Status: Abnormal   Collection Time: 02/14/20  9:54 PM  Result Value Ref Range   Alcohol, Ethyl (B) 327 (HH) <10 mg/dL  Acetaminophen level     Status: Abnormal   Collection Time: 02/14/20  9:54 PM  Result Value Ref Range   Acetaminophen (Tylenol), Serum <10 (L) 10 - 30 ug/mL  Salicylate level     Status: Abnormal   Collection Time: 02/14/20  9:54 PM  Result Value Ref Range   Salicylate Lvl [id number] (L) 7.0 - 30.0 mg/dL   ____________________________________________ ____________________________________________  RADIOLOGY  I personally reviewed all radiographic images ordered to evaluate for the above acute complaints and reviewed radiology reports and findings.  These findings were personally discussed with the patient.  Please see medical record for radiology report.  ____________________________________________   PROCEDURES  Procedure(s) performed:  Procedures    Critical Care performed: no ____________________________________________   INITIAL IMPRESSION / ASSESSMENT AND PLAN / ED COURSE  Pertinent labs & imaging results that were available during my care of the patient were reviewed by me and considered in my medical decision making (see chart for details).   DDX: Psychosis, delirium, medication effect, noncompliance, polysubstance abuse, Si, Hi, depression   KORTNEY CANTERA is a 59 y.o. who presents to the ED with for  evaluation of intoxication and laceration.  Patient has psych history of etoh abuse.  Laceration superficial and does not require repair.  Wound care provided.  Laboratory testing was ordered to evaluation for underlying electrolyte derangement or signs of underlying organic pathology to explain today's presentation.  Based on history and physical and laboratory evaluation, it appears that the patient's presentation is 2/2 underlying psychiatric disorder and will require further evaluation and management by inpatient psychiatry.  Patient was  made an IVC due to etoh abuse, self harm behavior.  Disposition pending psychiatric evaluation.     The patient has been placed in psychiatric observation due to the need to provide a safe environment for the patient while obtaining psychiatric consultation and evaluation, as well as ongoing medical and medication management to treat the patient's condition.  The patient has been placed under full IVC at this time.   The patient was evaluated in Emergency Department today for the symptoms described in the history of present illness. He/she was evaluated in the context of the global COVID-19 pandemic, which necessitated consideration that the patient might be at risk for infection with the SARS-CoV-2 virus that causes COVID-19. Institutional protocols and algorithms that pertain to the evaluation of patients at risk for COVID-19 are in a state of rapid change based on information released by regulatory bodies including the CDC and federal and state organizations. These policies and algorithms were followed during the patient's care in the ED.  As part of my medical decision making, I reviewed the following data within the [facility] notes reviewed and incorporated, Labs reviewed, notes from prior ED visits and [place] Controlled Substance Database   ____________________________________________   FINAL CLINICAL IMPRESSION(S) / ED  DIAGNOSES  Final diagnoses:  Alcoholic intoxication with complication ([place])  Depression, unspecified depression type  Deliberate self-cutting      NEW MEDICATIONS STARTED DURING THIS VISIT:  New Prescriptions   No medications on file     Note:  This document was prepared using Dragon voice recognition software and may include unintentional dictation errors.    Merlyn Lot, MD 02/14/20 2259

## 2020-02-14 NOTE — ED Notes (Signed)
Lab called to report critical ETOH value, MD informed.

## 2020-02-14 NOTE — ED Notes (Signed)
Pt. Tearful upon assessment. Pt. Denies SI/HI.

## 2020-02-14 NOTE — ED Notes (Signed)
EDP talking to patient at bedside.

## 2020-02-15 ENCOUNTER — Emergency Department: Payer: Self-pay

## 2020-02-15 MED ORDER — IBUPROFEN 400 MG PO TABS
400.0000 mg | ORAL_TABLET | Freq: Once | ORAL | Status: AC
  Administered 2020-02-15: 400 mg via ORAL
  Filled 2020-02-15: qty 1

## 2020-02-15 NOTE — ED Notes (Signed)
Pt provided with a lunch tray and fluids.

## 2020-02-15 NOTE — Consult Note (Signed)
Patient to be discharged, IVC rescinded.

## 2020-02-15 NOTE — Consult Note (Signed)
[place] Psychiatry Consult   Reason for Consult: Laceration Referring Physician: Dr. Quentin Cornwall Patient Identification: Miguel [name] MRN:  [id number] Principal Diagnosis: <principal problem not specified> Diagnosis:  Active Problems:   Hypertension   Tachycardia   Gout   Chronic back pain   Alcohol use disorder, severe, dependence (HCC)   Substance induced mood disorder ([place])   Involuntary commitment   Suicide and self-inflicted injury by cutting and piercing instrument ([place])   Leg hematoma   Self-inflicted laceration of wrist (HCC)   Asthma   GERD (gastroesophageal reflux disease)   OCD (obsessive compulsive disorder)   Breast lump or mass   Sepsis ([place])   Alcoholic intoxication without complication ([place])   Alcohol withdrawal ([place])   AKI (acute kidney injury) ([place])   Left-sided chest wall pain   Alcoholic cirrhosis of liver without ascites ([place])   Alcohol abuse   MDD (major depressive disorder), recurrent severe, without psychosis ([place])   Generalized anxiety disorder   Health care maintenance   Right knee pain   Moderate episode of recurrent major depressive disorder ([place])   Alcohol abuse with alcohol-induced mood disorder ([place])   Total Time spent with patient: 30 minutes  Subjective: " I want to use the telephone to call my lawyer NOW. Miguel [name] is a 59 y.o. male patient presented to [facility] ED via EMS voluntarily, and he was placed under Involuntary Commitment Status (IVC) by the EDP. The patient is presenting with a laceration to his left forearm. He states he does not know how he cut himself. The patient voiced, "I have been doing so well" "I was not going to come back here this soon."  Per nursing triage note, the patient reports he has been sitting in his car all day listening to music and drinking, and the next thing her knew, he was coming to the hospital after cutting himself with a "letter opener" pt denies thoughts of hurting self or others now and admits to  drinking today.   The patient was seen face-to-face by this provider; chart reviewed and consulted with Dr. Quentin Cornwall on 02/15/2020 due to the patient's care. It was discussed with the EDP that the patient does not meet the criteria to be admitted to the psychiatric inpatient unit.  The patient is alert and oriented x3, intoxicated and belligerent, blood alcohol level 327 mg/DL on evaluation. The patient does not appear to be responding to internal or external stimuli. He is presenting with some delusional thinking by demanding to be discharged home.  The patient denies auditory or visual hallucinations. The patient denies any suicidal, homicidal, or self-harm ideations.  He voiced that he cut himself; he does not remember what happened.  He states "I was sitting in the car drinking and listening to music and looked at my arm; it was bleeding. The patient is not presenting with any psychotic or paranoid behaviors. During an encounter with the patient, he was able to answer most questions appropriately.   Plan: The patient will remain under overnight observation then reassessed in the am for stabilization and treatment.  HPI: Per Dr. Quentin Cornwall: Miguel [name] is a 59 y.o. male with extensive history of alcohol abuse presents to the ER for evaluation of laceration of his left forearm.  Patient evasive appears intoxicated not providing much history other than being very tearful stating he does not want to be here.  Does admit to cutting himself.  States he feels lonely and hopeless.  States he feels depressed.  States that he is not doing "so good.  "  Past Psychiatric History:  Alcohol abuse Anxiety OCD (obsessive compulsive disorder)  Risk to Self: Suicidal Ideation: No Suicidal Intent: No Is patient at risk for suicide?: No Suicidal Plan?: No Access to Means: No What has been your use of drugs/alcohol within the last 12 months?: Use of alcohol How many times?: (Unknown) Other Self Harm Risks:  Cutting Triggers for Past Attempts: Unpredictable Intentional Self Injurious Behavior: Cutting Comment - Self Injurious Behavior: Cuts to left arm Risk to Others: Homicidal Ideation: No Thoughts of Harm to Others: No Current Homicidal Intent: No Current Homicidal Plan: No Access to Homicidal Means: No Identified Victim: None identified History of harm to others?: No Assessment of Violence: None Noted Does patient have access to weapons?: No Criminal Charges Pending?: No Does patient have a court date: No Prior Inpatient Therapy: Prior Inpatient Therapy: Yes Prior Therapy Dates: (Refused to answer) Prior Therapy Facilty/Provider(s): (refused to asnwer) Reason for Treatment: (refused to answer) Prior Outpatient Therapy: Prior Outpatient Therapy: (Unknown)  Past Medical History:  Past Medical History:  Diagnosis Date  . Alcohol abuse   . Anxiety   . Asthma   . GERD (gastroesophageal reflux disease)   . Gout   . Hypertension   . Kidney stone   . OCD (obsessive compulsive disorder)   . Renal colic     Past Surgical History:  Procedure Laterality Date  . CHOLECYSTECTOMY  2012  . EXTRACORPOREAL SHOCK WAVE LITHOTRIPSY Left 01/12/2019   Procedure: EXTRACORPOREAL SHOCK WAVE LITHOTRIPSY (ESWL);  Surgeon: Billey Co, MD;  Location: [facility] ORS;  Service: Urology;  Laterality: Left;   Family History:  Family History  Problem Relation Age of Onset  . Alcohol abuse Father   . Breast cancer Mother 57   Family Psychiatric  History:  Social History:  Social History   Substance and Sexual Activity  Alcohol Use Yes  . Alcohol/week: 12.0 standard drinks  . Types: 12 Shots of liquor per week   Comment: pint daily     Social History   Substance and Sexual Activity  Drug Use No    Social History   Socioeconomic History  . Marital status: Married    Spouse name: Not on file  . Number of children: Not on file  . Years of education: Not on file  . Highest education level:  Not on file  Occupational History  . Not on file  Tobacco Use  . Smoking status: Former Smoker    Types: Cigarettes  . Smokeless tobacco: Never Used  . Tobacco comment: quit 30 years ago  Substance and Sexual Activity  . Alcohol use: Yes    Alcohol/week: 12.0 standard drinks    Types: 12 Shots of liquor per week    Comment: pint daily  . Drug use: No  . Sexual activity: Yes  Other Topics Concern  . Not on file  Social History Narrative   Lives at home with his wife and takes care of his wife. Independent at baseline      - Biggest strain is financial but doesn't think they could got get more help.      Patient expressed interest in possible financial assistance. Informed written consent obtained   Social Determinants of Health   Financial Resource Strain:   . Difficulty of Paying Living Expenses:   Food Insecurity:   . Worried About Charity fundraiser in the Last Year:   . [employer]  in the Last Year:   Transportation Needs:   . Film/video editor (Medical):   Marland Kitchen Lack of Transportation (Non-Medical):   Physical Activity:   . Days of Exercise per Week:   . Minutes of Exercise per Session:   Stress:   . Feeling of Stress :   Social Connections:   . Frequency of Communication with Friends and Family:   . Frequency of Social Gatherings with Friends and Family:   . Attends Religious Services:   . Active Member of Clubs or Organizations:   . Attends Archivist Meetings:   Marland Kitchen Marital Status:    Additional Social History:    Allergies:   Allergies  Allergen Reactions  . Percocet [Oxycodone-Acetaminophen] Nausea And Vomiting    Labs:  Results for orders placed or performed during the hospital encounter of 02/14/20 (from the past 48 hour(s))  CBC with Differential/Platelet     Status: Abnormal   Collection Time: 02/14/20  9:54 PM  Result Value Ref Range   WBC 7.1 4.0 - 10.5 K/uL   RBC 4.82 4.22 - 5.81 MIL/uL   Hemoglobin 14.1 13.0 - 17.0 g/dL    HCT 44.9 39.0 - 52.0 %   MCV 93.2 80.0 - 100.0 fL   MCH 29.3 26.0 - 34.0 pg   MCHC 31.4 30.0 - 36.0 g/dL   RDW 16.3 (H) 11.5 - 15.5 %   Platelets 197 150 - 400 K/uL   nRBC 0.0 0.0 - 0.2 %   Neutrophils Relative % 51 %   Neutro Abs 3.6 1.7 - 7.7 K/uL   Lymphocytes Relative 38 %   Lymphs Abs 2.7 0.7 - 4.0 K/uL   Monocytes Relative 8 %   Monocytes Absolute 0.6 0.1 - 1.0 K/uL   Eosinophils Relative 2 %   Eosinophils Absolute 0.2 0.0 - 0.5 K/uL   Basophils Relative 1 %   Basophils Absolute 0.1 0.0 - 0.1 K/uL   Immature Granulocytes 0 %   Abs Immature Granulocytes 0.02 0.00 - 0.07 K/uL    Comment: Performed at [facility], [address] [place], [place] [address]  Comprehensive metabolic panel     Status: Abnormal   Collection Time: 02/14/20  9:54 PM  Result Value Ref Range   Sodium 146 (H) 135 - 145 mmol/L   Potassium 3.5 3.5 - 5.1 mmol/L   Chloride 107 98 - 111 mmol/L   CO2 27 22 - 32 mmol/L   Glucose, Bld 105 (H) 70 - 99 mg/dL    Comment: Glucose reference range applies only to samples taken after fasting for at least 8 hours.   BUN 13 6 - 20 mg/dL   Creatinine, Ser 0.89 0.61 - 1.24 mg/dL   Calcium 9.2 8.9 - 10.3 mg/dL   Total Protein 7.9 6.5 - 8.1 g/dL   Albumin 4.4 3.5 - 5.0 g/dL   AST 20 15 - 41 U/L   ALT 21 0 - 44 U/L   Alkaline Phosphatase 86 38 - 126 U/L   Total Bilirubin 0.5 0.3 - 1.2 mg/dL   GFR calc non Af Amer >60 >60 mL/min   GFR calc Af Amer >60 >60 mL/min   Anion gap 12 5 - 15    Comment: Performed at [facility], [address] [place], [place] [address]  Ethanol     Status: Abnormal   Collection Time: 02/14/20  9:54 PM  Result Value Ref Range   Alcohol, Ethyl (B) 327 (HH) <10 mg/dL  Comment: CRITICAL RESULT CALLED TO, READ BACK BY AND VERIFIED WITH MATHEW MARTIN @2255  02/14/20 MJU (NOTE) Lowest detectable limit for serum alcohol is 10 mg/dL. For medical purposes only. Performed at [facility], [address]  [place], [place] [address]   Acetaminophen level     Status: Abnormal   Collection Time: 02/14/20  9:54 PM  Result Value Ref Range   Acetaminophen (Tylenol), Serum <10 (L) 10 - 30 ug/mL    Comment: (NOTE) Therapeutic concentrations vary significantly. A range of 10-30 ug/mL  may be an effective concentration for many patients. However, some  are best treated at concentrations outside of this range. Acetaminophen concentrations >150 ug/mL at 4 hours after ingestion  and >50 ug/mL at 12 hours after ingestion are often associated with  toxic reactions. Performed at [facility], [address] [place], [place] [id number]   Salicylate level     Status: Abnormal   Collection Time: 02/14/20  9:54 PM  Result Value Ref Range   Salicylate Lvl [id number] (L) 7.0 - 30.0 mg/dL    Comment: Performed at [facility], [address] [place], [place] [address]    Current Facility-Administered Medications  Medication Dose Route Frequency Provider Last Rate Last Admin  . LORazepam (ATIVAN) injection 0-4 mg  0-4 mg Intravenous Q6H Merlyn Lot, MD       Or  . LORazepam (ATIVAN) tablet 0-4 mg  0-4 mg Oral Q6H Merlyn Lot, MD      . Derrill Memo ON 02/17/2020] LORazepam (ATIVAN) injection 0-4 mg  0-4 mg Intravenous Q12H Merlyn Lot, MD       Or  . Derrill Memo ON 02/17/2020] LORazepam (ATIVAN) tablet 0-4 mg  0-4 mg Oral Q12H Merlyn Lot, MD      . thiamine tablet 100 mg  100 mg Oral Daily Merlyn Lot, MD       Or  . thiamine (B-1) injection 100 mg  100 mg Intravenous Daily Merlyn Lot, MD       Current Outpatient Medications  Medication Sig Dispense Refill  . allopurinol (ZYLOPRIM) 300 MG tablet Take 1 tablet (300 mg total) by mouth daily. 30 tablet 1  . ascorbic acid (VITAMIN C) 500 MG tablet Take 1 tablet (500 mg total) by mouth daily. 30 tablet 1  . citalopram (CELEXA) 20 MG tablet Take 1 tablet (20 mg total) by mouth daily. 30 tablet 0  . citalopram (CELEXA) 20 MG  tablet Take 1 tablet (20 mg total) by mouth daily. 30 tablet 0  . cloNIDine (CATAPRES) 0.1 MG tablet Take 1 tablet (0.1 mg total) by mouth daily. 30 tablet 2  . colchicine 0.6 MG tablet Take 1 tablet (0.6 mg total) by mouth daily as needed. 30 tablet 1  . cyclobenzaprine (FLEXERIL) 10 MG tablet Take 1 tablet (10 mg total) by mouth 3 (three) times daily as needed. 15 tablet 0  . Fluticasone-Salmeterol (ADVAIR DISKUS) 100-50 MCG/DOSE AEPB Inhale 1 puff into the lungs 2 (two) times daily. [id number] each 0  . folic acid (FOLVITE) 1 MG tablet Take 1 tablet (1 mg total) by mouth daily. 60 tablet 0  . hydrOXYzine (ATARAX/VISTARIL) 25 MG tablet Take 1 tablet (25 mg total) by mouth 3 (three) times daily as needed for anxiety. 60 tablet 1  . ibuprofen (ADVIL) 600 MG tablet Take 1 tablet (600 mg total) by mouth every 8 (eight) hours as needed. 15 tablet 0  . lisinopril (ZESTRIL) 40 MG tablet Take 1 tablet (40 mg total) by mouth  daily. 30 tablet 1  . Multiple Vitamins-Iron (MULTIVITAMINS WITH IRON) TABS tablet Take 1 tablet by mouth daily. 30 tablet 1  . pantoprazole (PROTONIX) 40 MG tablet Take 1 tablet (40 mg total) by mouth daily. 90 tablet 0  . QUEtiapine (SEROQUEL) 50 MG tablet Take 1 tablet (50 mg total) by mouth at bedtime. 60 tablet 1  . thiamine 250 MG tablet Take 1 tablet (250 mg total) by mouth daily. 30 tablet 1    Musculoskeletal: Strength & Muscle Tone: within normal limits Gait & Station: normal Patient leans: N/A  Psychiatric Specialty Exam: Physical Exam  Nursing note and vitals reviewed. Constitutional: He is oriented to person, place, and time. He appears well-developed and well-nourished.  Cardiovascular: Normal rate.  Respiratory: Effort normal.  Musculoskeletal:        General: Normal range of motion.     Cervical back: Normal range of motion and neck supple.  Neurological: He is alert and oriented to person, place, and time.    Review of Systems  Skin: Positive for wound.   Psychiatric/Behavioral: Positive for agitation, behavioral problems and self-injury. The patient is nervous/anxious and is hyperactive.   All other systems reviewed and are negative.   Blood pressure 131/85, pulse 91, temperature 98.1 F (36.7 C), temperature source Oral, resp. rate 18, height 6' (1.829 m), weight 83.9 kg, SpO2 97 %.Body mass index is 25.09 kg/m.  General Appearance: Bizarre and Disheveled  Eye Contact:  Minimal  Speech:  Garbled and Slurred  Volume:  Increased  Mood:  Angry, Anxious, Euphoric and Irritable  Affect:  Congruent, Constricted and Inappropriate  Thought Process:  Disorganized and Irrelevant  Orientation:  Full (Time, Place, and Person)  Thought Content:  Illogical, Paranoid Ideation and Abstract Reasoning  Suicidal Thoughts:  No  Homicidal Thoughts:  No  Memory:  Immediate;   Fair Recent;   Fair Remote;   Fair  Judgement:  Impaired  Insight:  Lacking  Psychomotor Activity:  Normal  Concentration:  Concentration: Poor and Attention Span: Poor  Recall:  Poor  Fund of Knowledge:  Poor  Language:  Fair  Akathisia:  Negative  Handed:  Right  AIMS (if indicated):     Assets:  Communication Skills Desire for Improvement Physical Health Social Support  ADL's:  Intact  Cognition:  WNL  Sleep:    Okay     Treatment Plan Summary: Plan overnight observation then reassessed in the am.  Disposition: No evidence of imminent risk to self or others at present.   Patient does not meet criteria for psychiatric inpatient admission. Supportive therapy provided about ongoing stressors. Discussed crisis plan, support from social network, calling 911, coming to the Emergency Department, and calling Suicide Hotline.  Caroline Sauger, NP 02/15/2020 1:13 AM

## 2020-02-15 NOTE — ED Notes (Signed)
Pt provided with phone to speak to spouse.

## 2020-02-15 NOTE — BH Assessment (Signed)
[place] Assessment Progress Note  Per Lala Lund, MD, this pt does not require psychiatric hospitalization at this time.  Pt presents under IVC initiated by the EDP, which Dr Claris Gower intends to rescind.  Pt is to be discharged from [facility] with recommendation to follow up with [facility].  This has been included in pt's discharge instructions.  Pt's nurse has been notified.  Jalene Mullet, [place] Triage Specialist [phone]

## 2020-02-15 NOTE — ED Provider Notes (Signed)
Emergency Medicine Observation Re-evaluation Note  Miguel [name] is a 59 y.o. male, seen on rounds today.  Pt initially presented to the ED for complaints of Laceration Currently, the patient is resting in no acute distress.  Physical Exam  BP (!) 135/95   Pulse (!) 103   Temp 98.1 F (36.7 C) (Oral)   Resp 18   Ht 6' (1.829 m)   Wt 83.9 kg   SpO2 97%   BMI 25.09 kg/m  Physical Exam  ED Course / MDM  EKG:    I have reviewed the labs performed to date as well as medications administered while in observation.  No events overnight. Plan  Current plan is for psychiatry reassessment in the morning and disposition. Patient is under full IVC at this time.   Paulette Blanch, MD 02/15/20 [phone]

## 2020-02-15 NOTE — ED Notes (Signed)
Pt provided with a breakfast tray and fluids.

## 2020-02-15 NOTE — ED Provider Notes (Signed)
-----------------------------------------   1:58 PM on 02/15/2020 -----------------------------------------  The patient has been evaluated by Dr. Claris Gower from psychiatry who recommends discharge.  He has rescinded the IVC.  The patient is stable for discharge at this time.  He is clinically sober.  Return precautions provided.   Arta Silence, MD 02/15/20 1358

## 2020-02-15 NOTE — Discharge Instructions (Addendum)
For your behavioral health needs, you are advised to follow up with [facility]. Contact them at your earliest opportunity to schedule an intake appointment:       [facility]      [address]      [place], [place] [address]      [phone]

## 2020-02-20 ENCOUNTER — Telehealth: Payer: Self-pay | Admitting: Gerontology

## 2020-02-20 ENCOUNTER — Ambulatory Visit: Payer: Medicaid Other | Admitting: Gerontology

## 2020-02-20 NOTE — Telephone Encounter (Signed)
Miguel [name] missed his appt today April 6th. I called him and he said he was aware of appt today but not about the time.  When he called last week he could hear [name] but we could not hear him.  He has been rescheduled for an appt on Wednesday, April 16th at 12pm.  [facility]

## 2020-02-24 IMAGING — CR LEFT RIBS - 2 VIEW
3 series · 3 of 3 positions shown · non-contrast
Comparison: Chest radiographs today and 03/20/2019.

CLINICAL DATA: Fall 2 days ago. Left anterior chest pain.

EXAM:
LEFT RIBS - 2 VIEW

[rib pa]
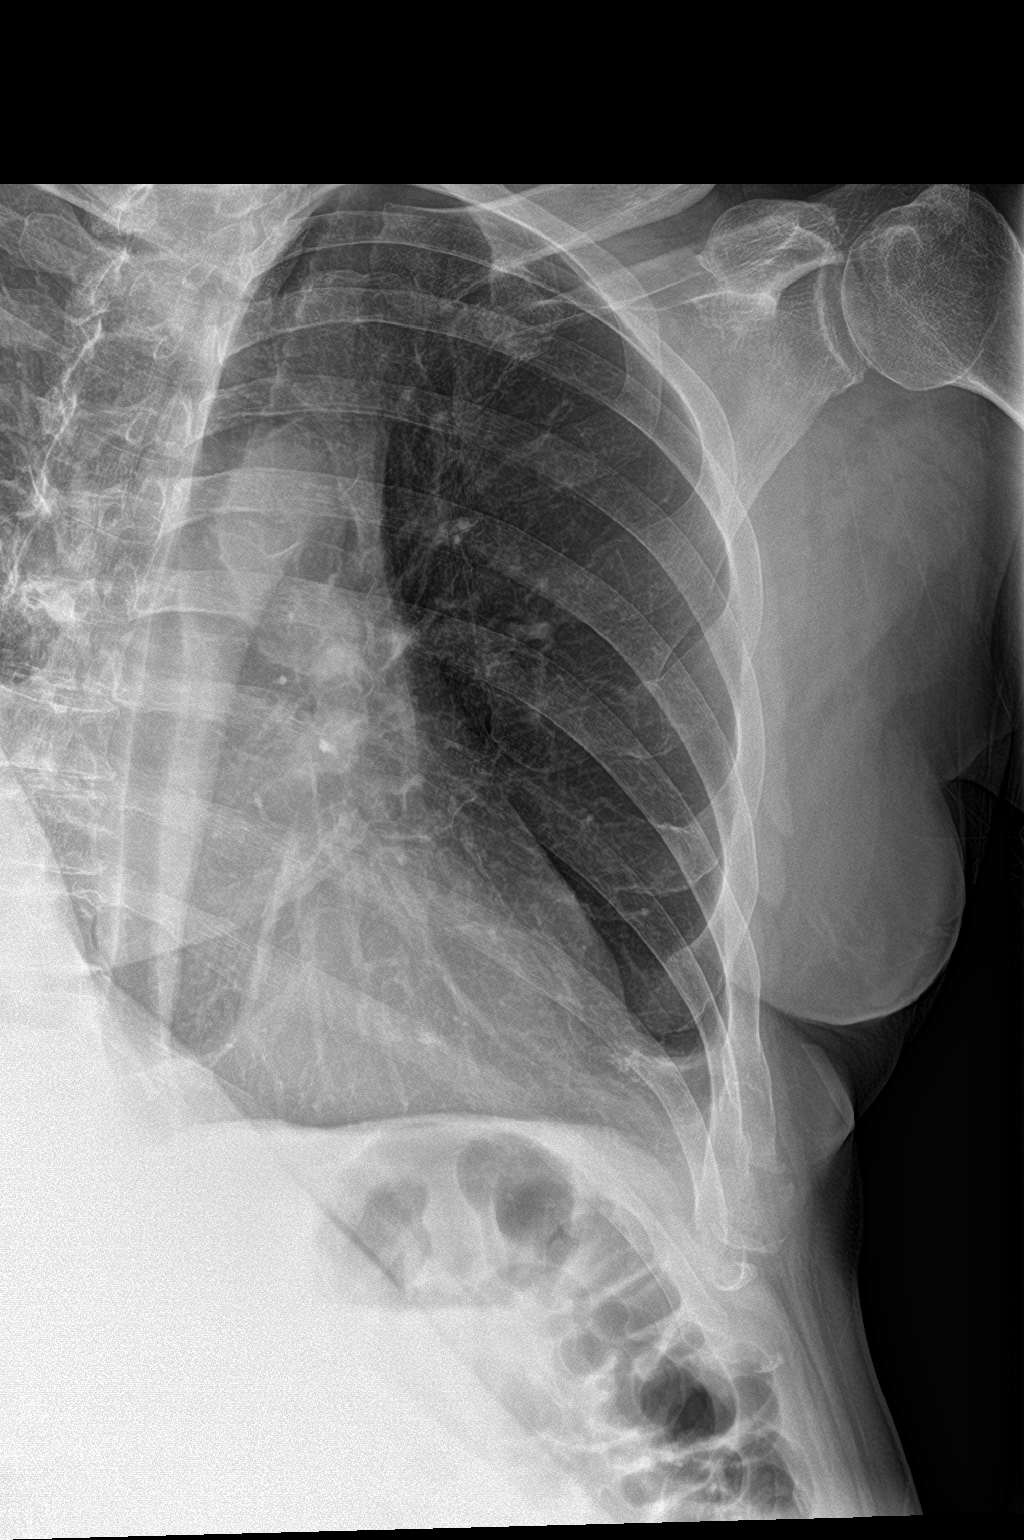

[rib pa obl]
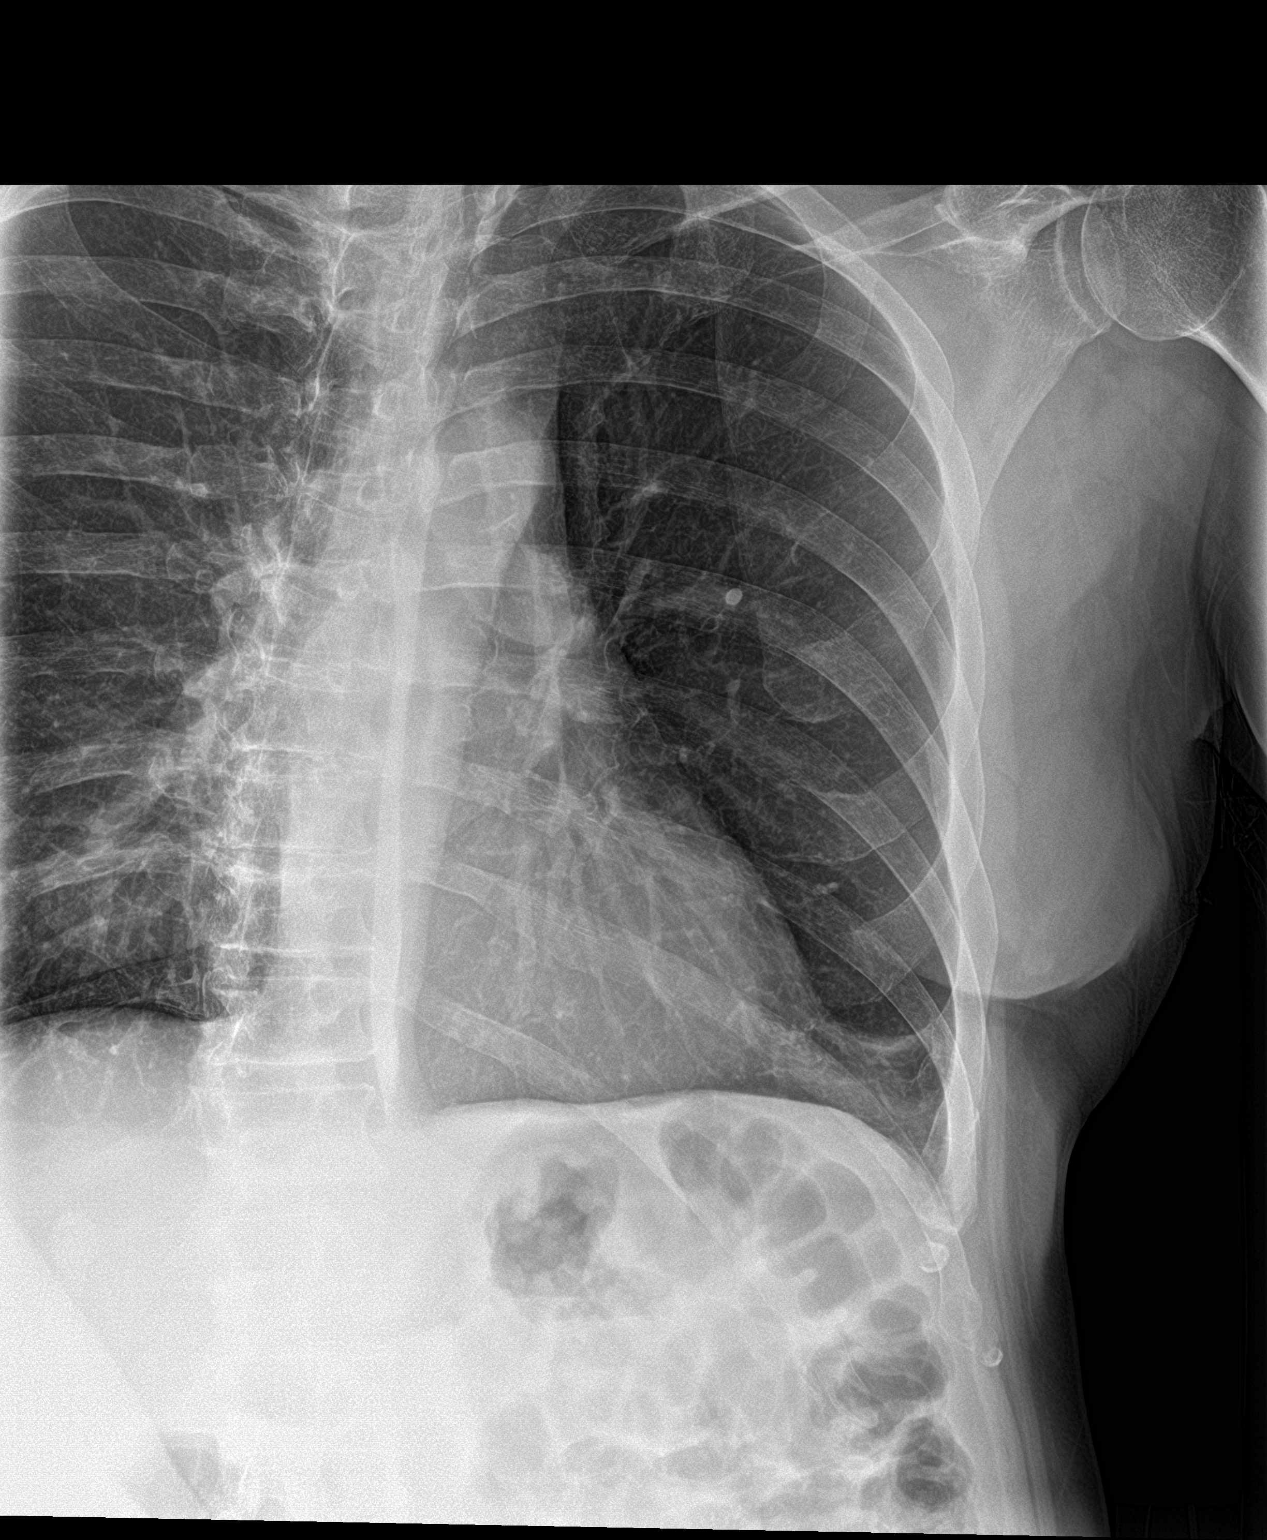

[rib ap]
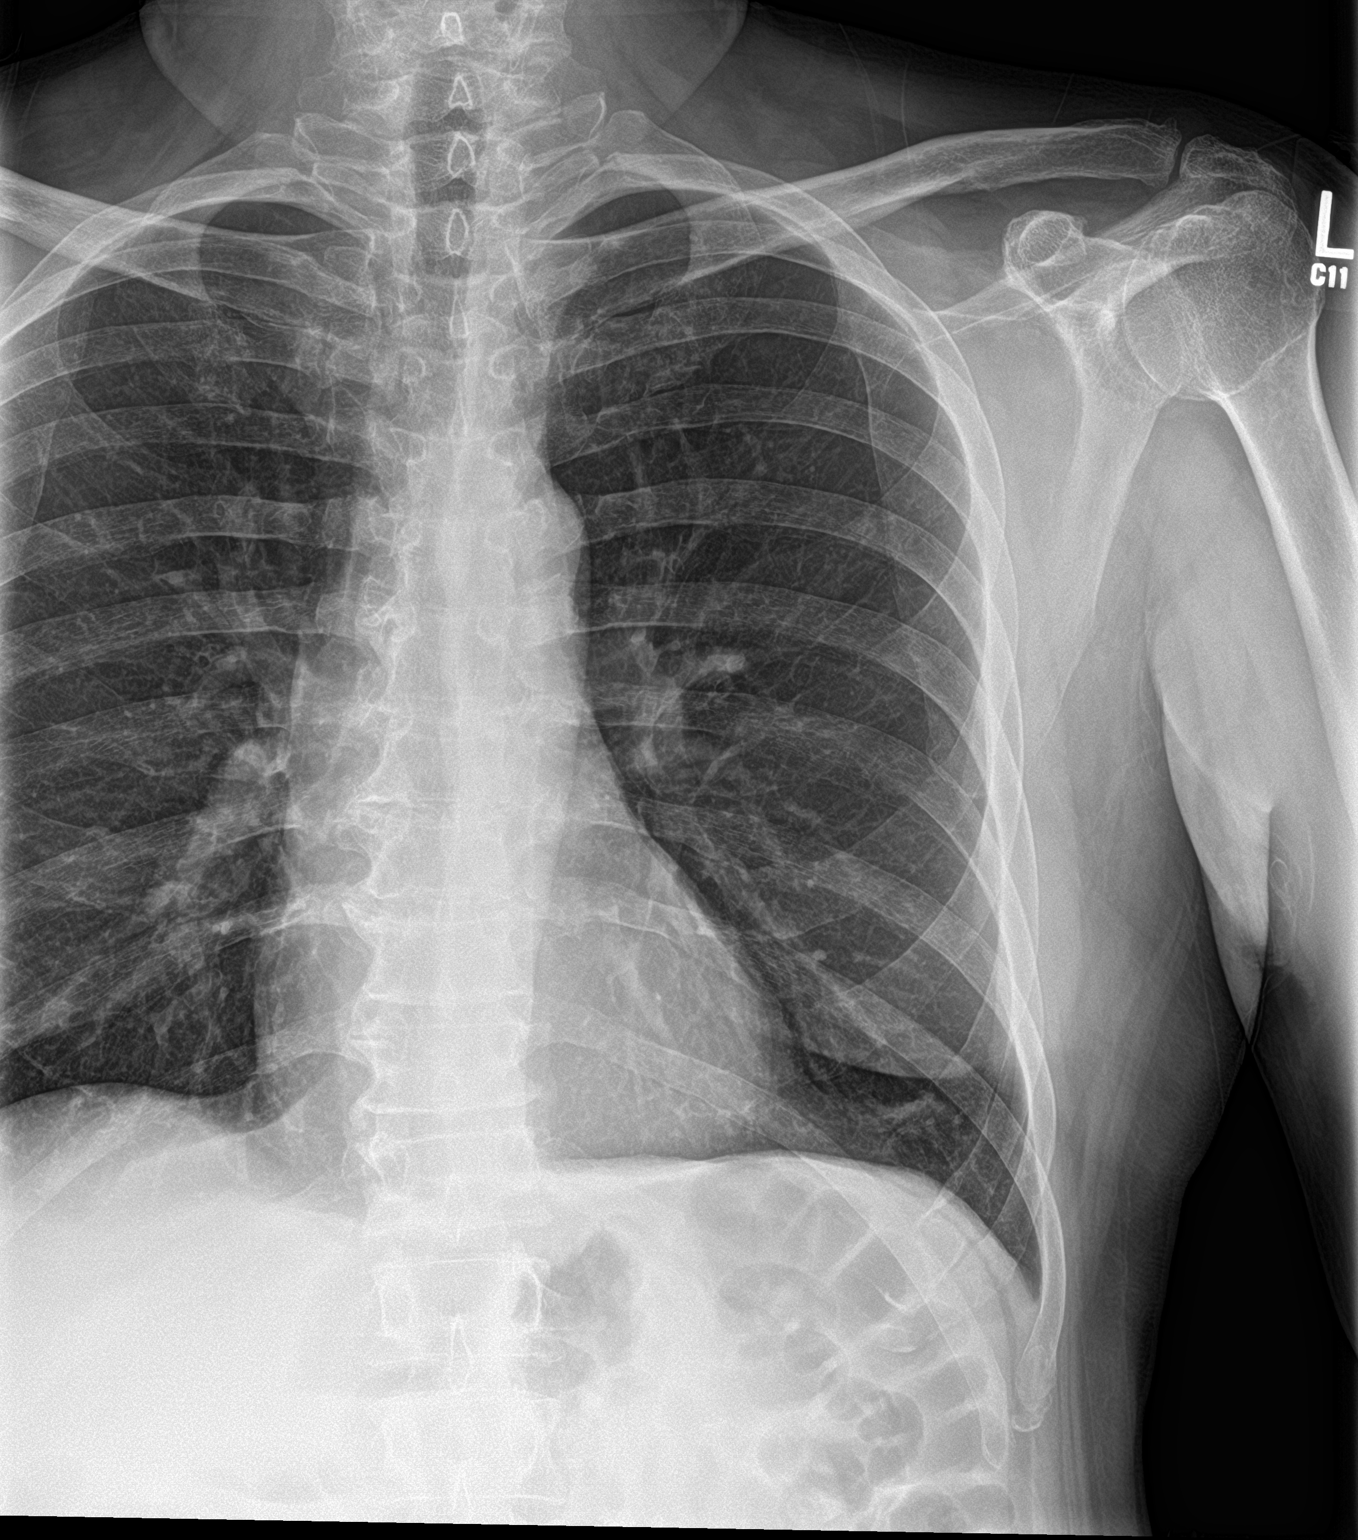

[3 of 3 positions shown; findings below may reference images not displayed]

FINDINGS: No evidence of acute left-sided rib fracture, pleural effusion or
pneumothorax. The aeration of the left lung base has improved with
mild residual atelectasis or scarring. There are possible old
fractures of the left 7th and 8th ribs anteriorly, best seen on the
oblique view.
IMPRESSION: No definite acute rib fracture, pleural effusion or pneumothorax.
Possible fractures of the left 7th and 8th ribs anteriorly.

## 2020-02-25 IMAGING — DX PORTABLE CHEST - 1 VIEW
1 series · 1 of 1 positions shown · non-contrast
Comparison: 05/26/2019, 03/20/2019, 03/09/2019

CLINICAL DATA: Chest pain, hypoxia

EXAM:
PORTABLE CHEST 1 VIEW

[chest ap]
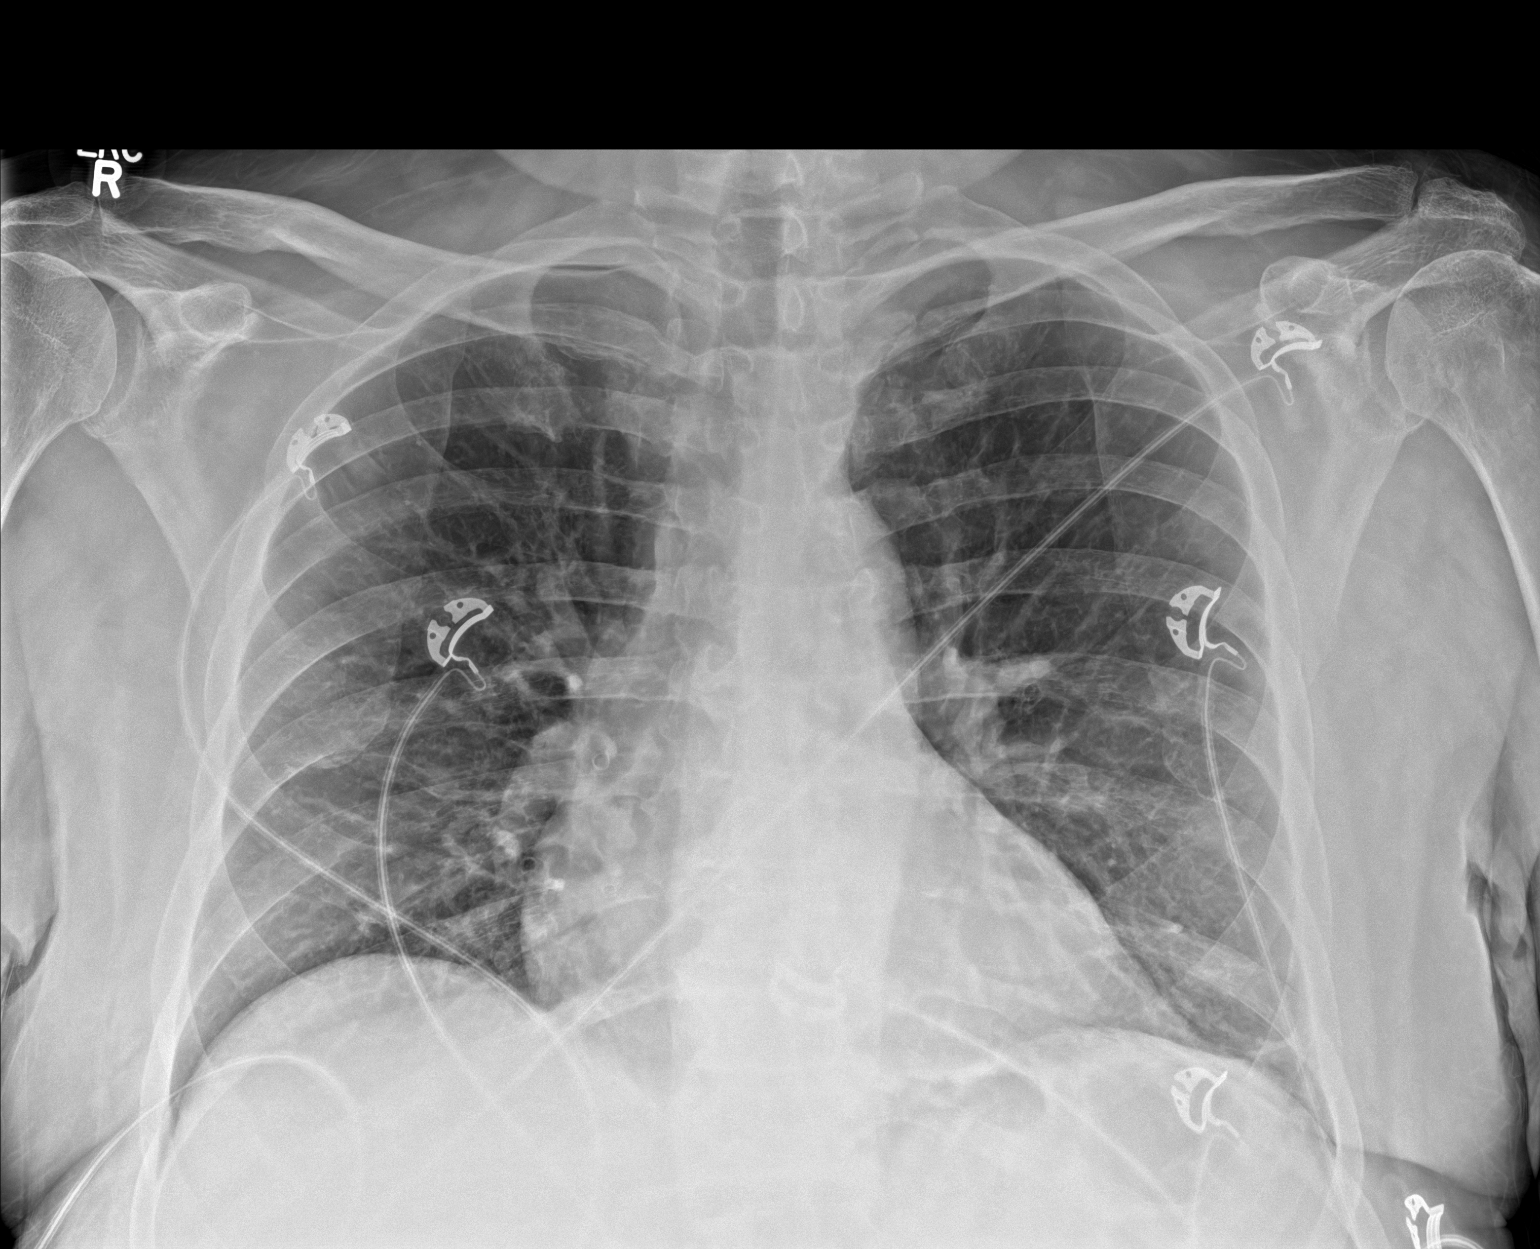

[1 of 1 positions shown; findings below may reference images not displayed]

FINDINGS: No acute airspace disease or pleural effusion. Minimal atelectasis
or scar at the left base. Stable cardiomediastinal silhouette. No
pneumothorax.
IMPRESSION: No active disease.  Minimal atelectasis or scar at the left base

## 2020-02-28 ENCOUNTER — Encounter: Payer: Self-pay | Admitting: Gerontology

## 2020-02-28 ENCOUNTER — Ambulatory Visit: Payer: Medicaid Other | Admitting: Gerontology

## 2020-02-28 ENCOUNTER — Other Ambulatory Visit: Payer: Self-pay

## 2020-02-28 VITALS — BP 102/70 | HR 90 | Temp 97.7°F | Ht 72.0 in | Wt 190.8 lb

## 2020-02-28 DIAGNOSIS — I10 Essential (primary) hypertension: Secondary | ICD-10-CM

## 2020-02-28 DIAGNOSIS — R0789 Other chest pain: Secondary | ICD-10-CM | POA: Insufficient documentation

## 2020-02-28 DIAGNOSIS — M109 Gout, unspecified: Secondary | ICD-10-CM

## 2020-02-28 DIAGNOSIS — F1010 Alcohol abuse, uncomplicated: Secondary | ICD-10-CM

## 2020-02-28 DIAGNOSIS — F411 Generalized anxiety disorder: Secondary | ICD-10-CM

## 2020-02-28 DIAGNOSIS — R1010 Upper abdominal pain, unspecified: Secondary | ICD-10-CM | POA: Insufficient documentation

## 2020-02-28 MED ORDER — ALLOPURINOL 300 MG PO TABS: 300.0000 mg | ORAL_TABLET | Freq: Every day | ORAL | 1 refills | Status: DC

## 2020-02-28 NOTE — Progress Notes (Signed)
Established Patient Office Visit  Subjective:  Patient ID: Miguel [name], male    DOB: [birth year]  Age: 59 y.o. MRN: [id number]  CC:  Chief Complaint  Patient presents with  . Follow-up    notes R-sided tightness/pain in ribs    HPI Miguel [name] presents for follow up after hospital discharge and medication refill.  He states that he is compliant with his medications.  He has a history of alcohol abuse, and he states that he had 2 glass of wine 3 days ago.  He denies any withdrawal symptoms or peripheral tremors.  He he states that he is anxious because his wife was taken to the hospital prior to his appointment.  He states that he stopped following up at [facility].  He states that his mood is good, denies suicidal no homicidal ideation.  He states that he is experiencing intermittent nonradiating dull pain to right lateral aspect of the chest wall, especially with strenuous activity such as lifting his wife.  He states that pain has been going on for 6 months, but it resolves within 10 seconds.  Overall, he states that he is doing well and offers no further complaint.  Past Medical History:  Diagnosis Date  . Alcohol abuse   . Anxiety   . Asthma   . GERD (gastroesophageal reflux disease)   . Gout   . Hypertension   . Kidney stone   . OCD (obsessive compulsive disorder)   . Renal colic     Past Surgical History:  Procedure Laterality Date  . CHOLECYSTECTOMY  2012  . EXTRACORPOREAL SHOCK WAVE LITHOTRIPSY Left 01/12/2019   Procedure: EXTRACORPOREAL SHOCK WAVE LITHOTRIPSY (ESWL);  Surgeon: Billey Co, MD;  Location: [facility] ORS;  Service: Urology;  Laterality: Left;    Family History  Problem Relation Age of Onset  . Alcohol abuse Father   . Breast cancer Mother 47    Social History   Socioeconomic History  . Marital status: Married    Spouse name: Not on file  . Number of children: Not on file  . Years of education: Not on file  . Highest education level: Not on file   Occupational History  . Not on file  Tobacco Use  . Smoking status: Former Smoker    Types: Cigarettes  . Smokeless tobacco: Never Used  . Tobacco comment: quit 30 years ago  Substance and Sexual Activity  . Alcohol use: Yes    Alcohol/week: 12.0 standard drinks    Types: 12 Shots of liquor per week    Comment: pint daily  . Drug use: No  . Sexual activity: Yes  Other Topics Concern  . Not on file  Social History Narrative   Lives at home with his wife and takes care of his wife. Independent at baseline      - Biggest strain is financial but doesn't think they could got get more help.      Patient expressed interest in possible financial assistance. Informed written consent obtained   Social Determinants of Health   Financial Resource Strain:   . Difficulty of Paying Living Expenses:   Food Insecurity:   . Worried About Charity fundraiser in the Last Year:   . Arboriculturist in the Last Year:   Transportation Needs:   . Film/video editor (Medical):   Marland Kitchen Lack of Transportation (Non-Medical):   Physical Activity:   . Days of Exercise per Week:   . Minutes of  Exercise per Session:   Stress:   . Feeling of Stress :   Social Connections:   . Frequency of Communication with Friends and Family:   . Frequency of Social Gatherings with Friends and Family:   . Attends Religious Services:   . Active Member of Clubs or Organizations:   . Attends Archivist Meetings:   Marland Kitchen Marital Status:   Intimate Partner Violence:   . Fear of Current or Ex-Partner:   . Emotionally Abused:   Marland Kitchen Physically Abused:   . Sexually Abused:     Outpatient Medications Prior to Visit  Medication Sig Dispense Refill  . albuterol (VENTOLIN HFA) 108 (90 Base) MCG/ACT inhaler Inhale 2 puffs into the lungs every 6 (six) hours as needed.    Marland Kitchen ascorbic acid (VITAMIN C) 500 MG tablet Take 1 tablet (500 mg total) by mouth daily. 30 tablet 1  . citalopram (CELEXA) 20 MG tablet Take 1 tablet  (20 mg total) by mouth daily. 30 tablet 0  . citalopram (CELEXA) 20 MG tablet Take 1 tablet (20 mg total) by mouth daily. 30 tablet 0  . cloNIDine (CATAPRES) 0.1 MG tablet Take 1 tablet (0.1 mg total) by mouth daily. 30 tablet 2  . colchicine 0.6 MG tablet Take 1 tablet (0.6 mg total) by mouth daily as needed. 30 tablet 1  . Fluticasone-Salmeterol (ADVAIR DISKUS) 100-50 MCG/DOSE AEPB Inhale 1 puff into the lungs 2 (two) times daily. [id number] each 0  . folic acid (FOLVITE) 1 MG tablet Take 1 tablet (1 mg total) by mouth daily. 60 tablet 0  . lisinopril (ZESTRIL) 40 MG tablet Take 1 tablet (40 mg total) by mouth daily. 30 tablet 1  . Multiple Vitamins-Iron (MULTIVITAMINS WITH IRON) TABS tablet Take 1 tablet by mouth daily. 30 tablet 1  . pantoprazole (PROTONIX) 40 MG tablet Take 1 tablet (40 mg total) by mouth daily. 90 tablet 0  . QUEtiapine (SEROQUEL) 50 MG tablet Take 1 tablet (50 mg total) by mouth at bedtime. 60 tablet 1  . allopurinol (ZYLOPRIM) 300 MG tablet Take 1 tablet (300 mg total) by mouth daily. 30 tablet 1  . ibuprofen (ADVIL) 600 MG tablet Take 1 tablet (600 mg total) by mouth every 8 (eight) hours as needed. 15 tablet 0  . hydrOXYzine (ATARAX/VISTARIL) 25 MG tablet Take 1 tablet (25 mg total) by mouth 3 (three) times daily as needed for anxiety. (Patient not taking: Reported on 02/28/2020) 60 tablet 1  . thiamine 250 MG tablet Take 1 tablet (250 mg total) by mouth daily. (Patient not taking: Reported on 02/15/2020) 30 tablet 1  . cyclobenzaprine (FLEXERIL) 10 MG tablet Take 1 tablet (10 mg total) by mouth 3 (three) times daily as needed. (Patient not taking: Reported on 02/28/2020) 15 tablet 0   No facility-administered medications prior to visit.    Allergies  Allergen Reactions  . Percocet [Oxycodone-Acetaminophen] Nausea And Vomiting    ROS Review of Systems  Constitutional: Negative.   Respiratory: Negative.   Cardiovascular: Negative.   Musculoskeletal: Positive for myalgias.   Neurological: Negative.   Psychiatric/Behavioral: The patient is nervous/anxious.       Objective:    Physical Exam  Constitutional: He is oriented to person, place, and time. He appears well-developed.  HENT:  Head: Normocephalic and atraumatic.  Eyes: Pupils are equal, round, and reactive to light. EOM are normal.  Cardiovascular: Normal rate and regular rhythm.  Pulmonary/Chest: Effort normal and breath sounds normal.  Neurological: He is alert and  oriented to person, place, and time.  Psychiatric: He has a normal mood and affect. His behavior is normal. Judgment and thought content normal.    BP 102/70 (BP Location: Left Arm, Patient Position: Sitting, Cuff Size: Small)   Pulse 90   Temp 97.7 F (36.5 C)   Ht 6' (1.829 m)   Wt 190 lb 12.8 oz (86.5 kg)   SpO2 98%   BMI 25.88 kg/m  Wt Readings from Last 3 Encounters:  02/28/20 190 lb 12.8 oz (86.5 kg)  02/14/20 185 lb (83.9 kg)  12/21/19 185 lb (83.9 kg)     Health Maintenance Due  Topic Date Due  . Hepatitis C Screening  Never done  . COLONOSCOPY  Never done    There are no preventive care reminders to display for this patient.  Lab Results  Component Value Date   TSH 0.545 05/07/2019   Lab Results  Component Value Date   WBC 7.1 02/14/2020   HGB 14.1 02/14/2020   HCT 44.9 02/14/2020   MCV 93.2 02/14/2020   PLT 197 02/14/2020   Lab Results  Component Value Date   NA 146 (H) 02/14/2020   K 3.5 02/14/2020   CO2 27 02/14/2020   GLUCOSE 105 (H) 02/14/2020   BUN 13 02/14/2020   CREATININE 0.89 02/14/2020   BILITOT 0.5 02/14/2020   ALKPHOS 86 02/14/2020   AST 20 02/14/2020   ALT 21 02/14/2020   PROT 7.9 02/14/2020   ALBUMIN 4.4 02/14/2020   CALCIUM 9.2 02/14/2020   ANIONGAP 12 02/14/2020   Lab Results  Component Value Date   CHOL 144 03/12/2017   Lab Results  Component Value Date   HDL 52 03/12/2017   Lab Results  Component Value Date   LDLCALC 71 03/12/2017   Lab Results  Component  Value Date   TRIG 107 03/12/2017   Lab Results  Component Value Date   CHOLHDL 2.8 03/12/2017   Lab Results  Component Value Date   HGBA1C 5.1 03/12/2017      Assessment & Plan:     1. Gout, unspecified cause, unspecified chronicity, unspecified site -His gout is under control and he will continue on current treatment regimen. - allopurinol (ZYLOPRIM) 300 MG tablet; Take 1 tablet (300 mg total) by mouth daily.  Dispense: 30 tablet; Refill: 1  2. Essential hypertension -His blood pressure is under control and he will continue on current treatment regimen.  He was advised to continue on DASH diet and exercise as tolerated.  3. Alcohol abuse -He was encouraged on alcohol abstinence-provided with [facility] information, and was advised to call and schedule an appointment.  4. Generalized anxiety disorder -He was advised to call [facility] and schedule a follow-up appointment, and to call the crisis helpline for worsening symptoms.  5. Right-sided chest wall pain -Ddx muscle strain, he was advised to use OTC analgesic cream and to notify the clinic for worsening symptoms.   Follow-up: Return in about 6 weeks (around 04/10/2020), or if symptoms worsen or fail to improve.    Miguel Dimarco Jerold Coombe, NP

## 2020-03-03 ENCOUNTER — Emergency Department: Payer: Self-pay

## 2020-03-03 ENCOUNTER — Observation Stay
Admission: EM | Admit: 2020-03-03 | Discharge: 2020-03-04 | Discharge status: Against Medical Advice | Payer: Self-pay | Attending: Internal Medicine | Admitting: Internal Medicine

## 2020-03-03 ENCOUNTER — Other Ambulatory Visit: Payer: Self-pay

## 2020-03-03 ENCOUNTER — Encounter: Payer: Self-pay | Admitting: Emergency Medicine

## 2020-03-03 DIAGNOSIS — F419 Anxiety disorder, unspecified: Secondary | ICD-10-CM | POA: Insufficient documentation

## 2020-03-03 DIAGNOSIS — K7030 Alcoholic cirrhosis of liver without ascites: Secondary | ICD-10-CM | POA: Insufficient documentation

## 2020-03-03 DIAGNOSIS — Z7951 Long term (current) use of inhaled steroids: Secondary | ICD-10-CM | POA: Insufficient documentation

## 2020-03-03 DIAGNOSIS — F1024 Alcohol dependence with alcohol-induced mood disorder: Secondary | ICD-10-CM | POA: Insufficient documentation

## 2020-03-03 DIAGNOSIS — N179 Acute kidney failure, unspecified: Secondary | ICD-10-CM | POA: Insufficient documentation

## 2020-03-03 DIAGNOSIS — F10930 Alcohol use, unspecified with withdrawal, uncomplicated: Secondary | ICD-10-CM

## 2020-03-03 DIAGNOSIS — E860 Dehydration: Secondary | ICD-10-CM | POA: Insufficient documentation

## 2020-03-03 DIAGNOSIS — M109 Gout, unspecified: Secondary | ICD-10-CM | POA: Insufficient documentation

## 2020-03-03 DIAGNOSIS — J449 Chronic obstructive pulmonary disease, unspecified: Secondary | ICD-10-CM | POA: Insufficient documentation

## 2020-03-03 DIAGNOSIS — F339 Major depressive disorder, recurrent, unspecified: Secondary | ICD-10-CM | POA: Insufficient documentation

## 2020-03-03 DIAGNOSIS — Z79899 Other long term (current) drug therapy: Secondary | ICD-10-CM | POA: Insufficient documentation

## 2020-03-03 DIAGNOSIS — F1014 Alcohol abuse with alcohol-induced mood disorder: Secondary | ICD-10-CM | POA: Diagnosis present

## 2020-03-03 DIAGNOSIS — Y907 Blood alcohol level of 200-239 mg/100 ml: Secondary | ICD-10-CM | POA: Insufficient documentation

## 2020-03-03 DIAGNOSIS — F10230 Alcohol dependence with withdrawal, uncomplicated: Secondary | ICD-10-CM

## 2020-03-03 DIAGNOSIS — F10939 Alcohol use, unspecified with withdrawal, unspecified: Secondary | ICD-10-CM | POA: Diagnosis present

## 2020-03-03 DIAGNOSIS — Z20822 Contact with and (suspected) exposure to covid-19: Secondary | ICD-10-CM | POA: Insufficient documentation

## 2020-03-03 DIAGNOSIS — J45909 Unspecified asthma, uncomplicated: Secondary | ICD-10-CM | POA: Insufficient documentation

## 2020-03-03 DIAGNOSIS — F10239 Alcohol dependence with withdrawal, unspecified: Principal | ICD-10-CM | POA: Insufficient documentation

## 2020-03-03 DIAGNOSIS — K219 Gastro-esophageal reflux disease without esophagitis: Secondary | ICD-10-CM | POA: Insufficient documentation

## 2020-03-03 DIAGNOSIS — Z87891 Personal history of nicotine dependence: Secondary | ICD-10-CM | POA: Insufficient documentation

## 2020-03-03 DIAGNOSIS — I10 Essential (primary) hypertension: Secondary | ICD-10-CM | POA: Insufficient documentation

## 2020-03-03 LAB — COMPREHENSIVE METABOLIC PANEL
ALT: 36 U/L (ref 0–44)
AST: 44 U/L — ABNORMAL HIGH (ref 15–41)
Albumin: 4.7 g/dL (ref 3.5–5.0)
Alkaline Phosphatase: 94 U/L (ref 38–126)
Anion gap: 15 (ref 5–15)
BUN: 20 mg/dL (ref 6–20)
CO2: 24 mmol/L (ref 22–32)
Calcium: 9.3 mg/dL (ref 8.9–10.3)
Chloride: 102 mmol/L (ref 98–111)
Creatinine, Ser: 1.33 mg/dL — ABNORMAL HIGH (ref 0.61–1.24)
GFR calc Af Amer: >60 mL/min (ref >60)
GFR calc non Af Amer: 58 mL/min — ABNORMAL LOW (ref >60)
Glucose, Bld: 121 mg/dL — ABNORMAL HIGH (ref 70–99)
Potassium: 4.5 mmol/L (ref 3.5–5.1)
Sodium: 141 mmol/L (ref 135–145)
Total Bilirubin: 1 mg/dL (ref 0.3–1.2)
Total Protein: 8.2 g/dL — ABNORMAL HIGH (ref 6.5–8.1)

## 2020-03-03 LAB — ETHANOL: Alcohol, Ethyl (B): 233 mg/dL — ABNORMAL HIGH (ref <10)

## 2020-03-03 LAB — ACETAMINOPHEN LEVEL: Acetaminophen (Tylenol), Serum: <10 ug/mL — ABNORMAL LOW (ref 10–30)

## 2020-03-03 LAB — CBC
HCT: 45.3 % (ref 39.0–52.0)
Hemoglobin: 15 g/dL (ref 13.0–17.0)
MCH: 29.1 pg (ref 26.0–34.0)
MCHC: 33.1 g/dL (ref 30.0–36.0)
MCV: 88 fL (ref 80.0–100.0)
Platelets: 224 10*3/uL (ref 150–400)
RBC: 5.15 MIL/uL (ref 4.22–5.81)
RDW: 16.9 % — ABNORMAL HIGH (ref 11.5–15.5)
WBC: 8 10*3/uL (ref 4.0–10.5)
nRBC: 0 % (ref 0.0–0.2)

## 2020-03-03 LAB — LIPASE, BLOOD: Lipase: 34 U/L (ref 11–51)

## 2020-03-03 LAB — SALICYLATE LEVEL: Salicylate Lvl: <7 mg/dL — ABNORMAL LOW (ref 7.0–30.0)

## 2020-03-03 IMAGING — CR CHEST - 2 VIEW
2 series · 2 of 2 positions shown · non-contrast
Comparison: Radiograph May 27, 2019.

CLINICAL DATA: Chest pain after fall.

EXAM:
CHEST - 2 VIEW

[chest pa]
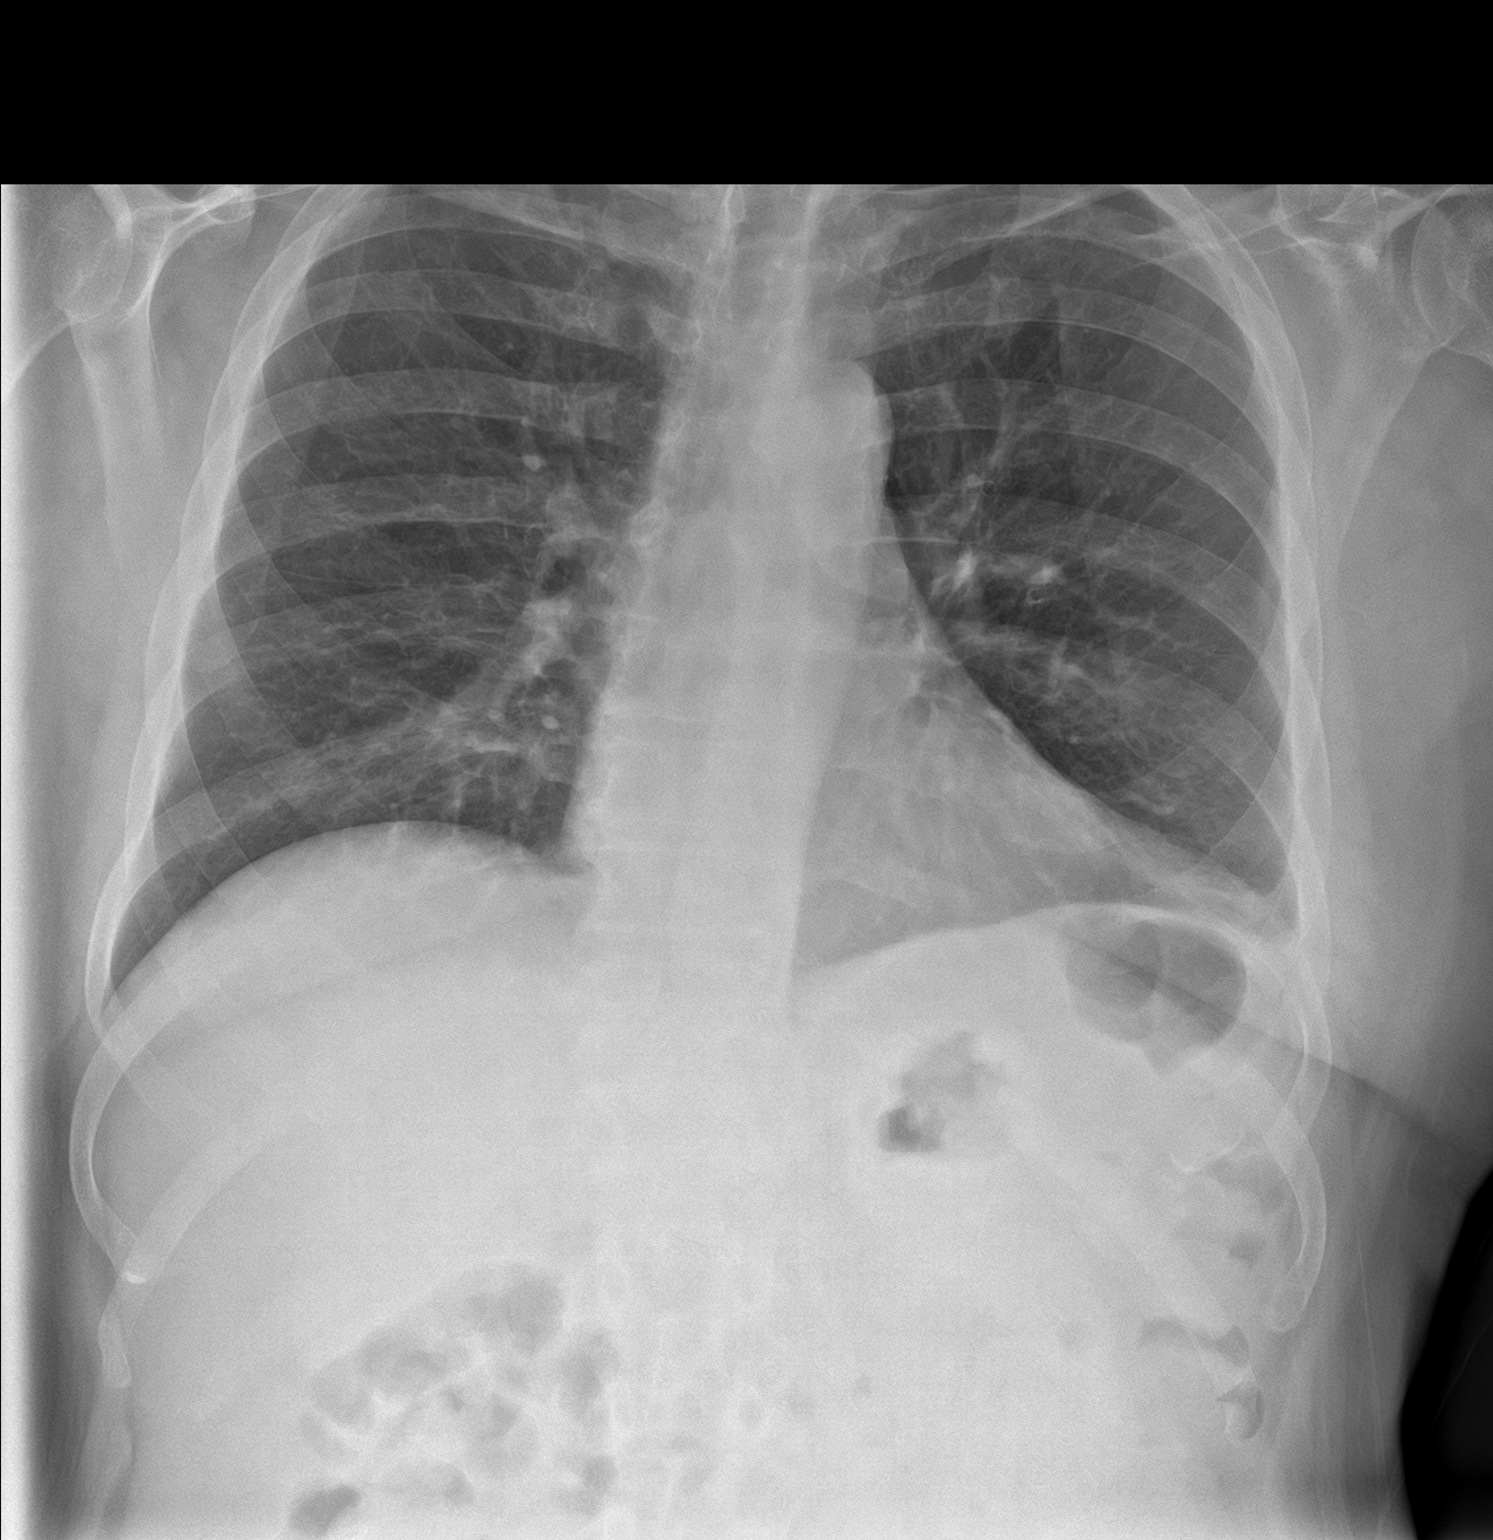

[chest lat]
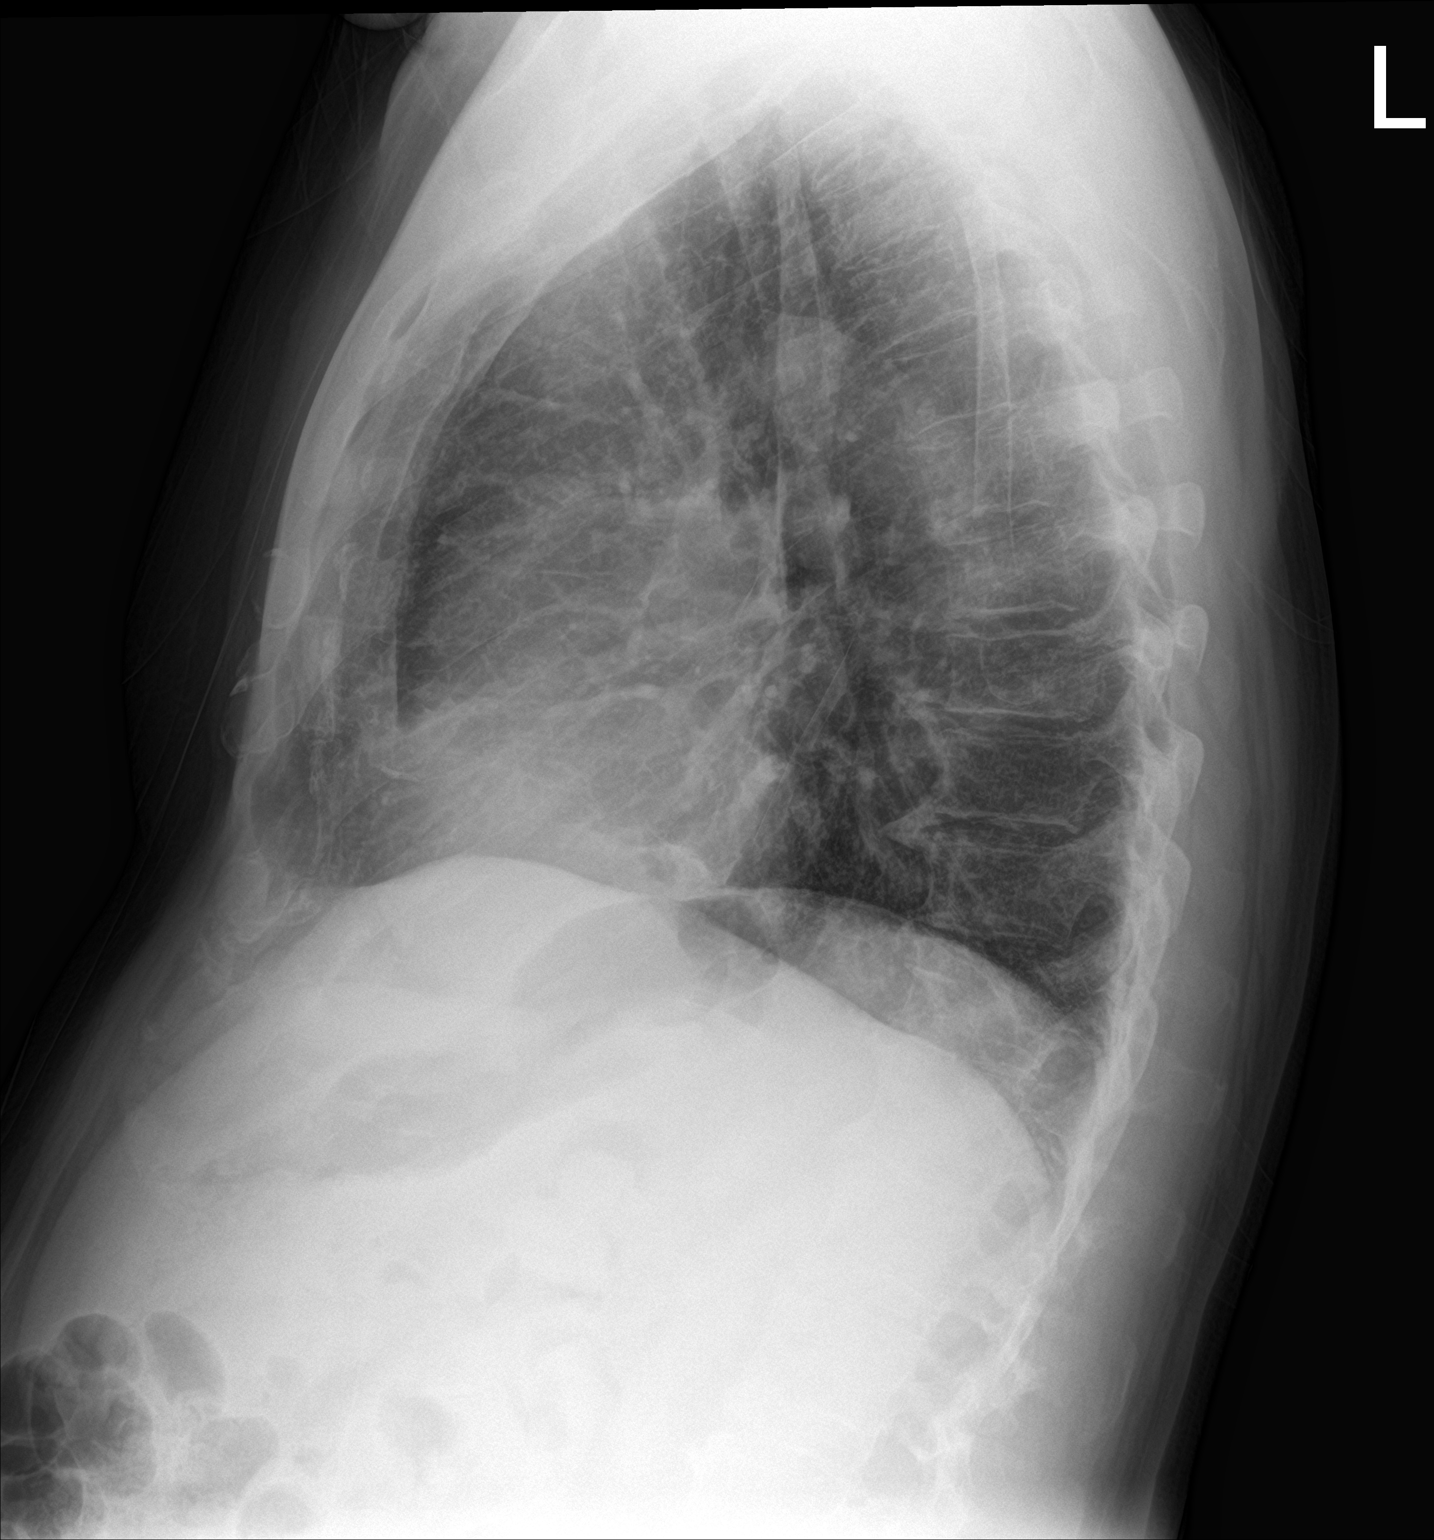

[2 of 2 positions shown; findings below may reference images not displayed]

FINDINGS: The heart size and mediastinal contours are within normal limits.
Both lungs are clear. No pneumothorax or pleural effusion is noted.
The visualized skeletal structures are unremarkable.
IMPRESSION: No active cardiopulmonary disease.

## 2020-03-03 MED ORDER — LORAZEPAM 2 MG/ML IJ SOLN: 0.0000 mg | Freq: Two times a day (BID) | INTRAMUSCULAR | Status: DC

## 2020-03-03 MED ORDER — LORAZEPAM 2 MG PO TABS: 0.0000 mg | ORAL_TABLET | Freq: Two times a day (BID) | ORAL | Status: DC

## 2020-03-03 MED ORDER — LORAZEPAM 2 MG/ML IJ SOLN
0.0000 mg | Freq: Four times a day (QID) | INTRAMUSCULAR | Status: DC
  Administered 2020-03-03: 1 mg via INTRAVENOUS
  Filled 2020-03-03: qty 1

## 2020-03-03 MED ORDER — THIAMINE HCL 100 MG PO TABS
100.0000 mg | ORAL_TABLET | Freq: Every day | ORAL | Status: DC
  Filled 2020-03-03: qty 1

## 2020-03-03 MED ORDER — SODIUM CHLORIDE 0.9 % IV BOLUS
1000.0000 mL | Freq: Once | INTRAVENOUS | Status: AC
  Administered 2020-03-03: 1000 mL via INTRAVENOUS

## 2020-03-03 MED ORDER — LORAZEPAM 2 MG/ML IJ SOLN
2.0000 mg | Freq: Once | INTRAMUSCULAR | Status: AC
  Administered 2020-03-03: 2 mg via INTRAVENOUS
  Filled 2020-03-03: qty 1

## 2020-03-03 MED ORDER — LORAZEPAM 2 MG PO TABS: 0.0000 mg | ORAL_TABLET | Freq: Four times a day (QID) | ORAL | Status: DC

## 2020-03-03 MED ORDER — THIAMINE HCL 100 MG/ML IJ SOLN
100.0000 mg | Freq: Every day | INTRAMUSCULAR | Status: DC
  Filled 2020-03-03: qty 2

## 2020-03-03 MED ORDER — IOHEXOL 9 MG/ML PO SOLN
500.0000 mL | Freq: Two times a day (BID) | ORAL | Status: DC | PRN
  Administered 2020-03-03 (×2): 500 mL via ORAL

## 2020-03-03 MED ORDER — ONDANSETRON HCL 4 MG/2ML IJ SOLN
4.0000 mg | Freq: Once | INTRAMUSCULAR | Status: AC
  Administered 2020-03-03: 4 mg via INTRAVENOUS
  Filled 2020-03-03: qty 2

## 2020-03-03 MED ORDER — IOHEXOL 300 MG/ML  SOLN
100.0000 mL | Freq: Once | INTRAMUSCULAR | Status: AC | PRN
  Administered 2020-03-03: 100 mL via INTRAVENOUS

## 2020-03-03 NOTE — ED Notes (Addendum)
pt provided water for PO fluid challenge.

## 2020-03-03 NOTE — ED Triage Notes (Signed)
Patient states that he is here for help with drinking. Patient states that he has been drinking the last 5 days. Patient states that his last drink was today. Patient states that he is also having anxiety.

## 2020-03-03 NOTE — ED Provider Notes (Signed)
[facility] EMERGENCY DEPARTMENT Provider Note   CSN: [id number] Arrival date & time: 03/03/20  1926     History Chief Complaint  Patient presents with  . Addiction Problem  . Anxiety    Miguel [name] is a 59 y.o. male history of alcohol abuse, anxiety, hypertension, here presenting with anxiety and abdominal pain and vomiting.  Patient states that he drinks a pint a day.  He was seen here 3 weeks ago for depression anxiety and was diagnosed with alcohol induced mood disorder.  Patient states that he continues to drink alcohol.  He states that he has not been drinking for 3 days.  He states that he has been having tremors and severe anxiety.  Denies any hallucinations.  He states that he has been starting vomiting as well.  Also has some left upper quadrant pain as well.   The history is provided by the patient.       Past Medical History:  Diagnosis Date  . Alcohol abuse   . Anxiety   . Asthma   . GERD (gastroesophageal reflux disease)   . Gout   . Hypertension   . Kidney stone   . OCD (obsessive compulsive disorder)   . Renal colic     Patient Active Problem List   Diagnosis Date Noted  . Right-sided chest wall pain 02/28/2020  . Alcohol abuse with alcohol-induced mood disorder ([place]) 12/02/2019  . Moderate episode of recurrent major depressive disorder ([place])   . Health care maintenance 10/26/2019  . Right knee pain 10/26/2019  . Generalized anxiety disorder 10/14/2019  . 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 Social anxiety disorder 07/27/2019  . Left-sided chest wall pain 06/14/2019  . Alcoholic cirrhosis of liver without ascites ([place]) 06/14/2019  . Alcohol abuse 06/14/2019  . AKI (acute kidney injury) ([place]) 05/27/2019  . Alcohol withdrawal ([place]) 05/07/2019  . Alcoholic intoxication without complication ([place])   . Sepsis ([place]) 03/08/2019  . Breast lump or mass 10/04/2018  . Sepsis secondary to  UTI ([place]) 09/14/2018  . Asthma 07/07/2018  . GERD (gastroesophageal reflux disease) 07/07/2018  . OCD (obsessive compulsive disorder) 07/07/2018  . Self-inflicted laceration of wrist ([place]) 03/10/2018  . Leg hematoma 12/25/2017  . Suicide and self-inflicted injury by cutting and piercing instrument ([place]) 03/10/2017  . Severe recurrent major 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 ([place]) 03/09/2017  . Substance induced mood disorder ([place]) 08/15/2016  . Involuntary commitment 08/15/2016  . Alcohol use disorder, severe, dependence ([place]) 02/05/2016  . Hypertension 12/05/2015  . Tachycardia 12/05/2015  . Gout 11/13/2015  . Chronic back pain 05/02/2015    Past Surgical History:  Procedure Laterality Date  . CHOLECYSTECTOMY  2012  . EXTRACORPOREAL SHOCK WAVE LITHOTRIPSY Left 01/12/2019   Procedure: EXTRACORPOREAL SHOCK WAVE LITHOTRIPSY (ESWL);  Surgeon: Billey Co, MD;  Location: [facility] ORS;  Service: Urology;  Laterality: Left;       Family History  Problem Relation Age of Onset  . Alcohol abuse Father   . Breast cancer Mother 70    Social History   Tobacco Use  . Smoking status: Former Smoker    Types: Cigarettes  . Smokeless tobacco: Never Used  . Tobacco comment: quit 30 years ago  Substance Use Topics  . Alcohol use: Yes    Alcohol/week: 12.0 standard drinks    Types: 12 Shots of liquor per week    Comment: pint daily  . Drug use: No    Home  Medications Prior to Admission medications   Medication Sig Start Date End Date Taking? Authorizing Provider  albuterol (VENTOLIN HFA) 108 (90 Base) MCG/ACT inhaler Inhale 2 puffs into the lungs every 6 (six) hours as needed.    [provider]  allopurinol (ZYLOPRIM) 300 MG tablet Take 1 tablet (300 mg total) by mouth daily. 02/28/20   Iloabachie, Chioma E, NP  ascorbic acid (VITAMIN C) 500 MG tablet Take 1 tablet (500 mg total) by mouth daily. 08/01/19   Clapacs, Madie Reno, MD  citalopram (CELEXA) 20 MG tablet Take 1 tablet  (20 mg total) by mouth daily. 12/21/19   Sable Feil, PA-C  citalopram (CELEXA) 20 MG tablet Take 1 tablet (20 mg total) by mouth daily. 12/21/19 12/20/20  Sable Feil, PA-C  cloNIDine (CATAPRES) 0.1 MG tablet Take 1 tablet (0.1 mg total) by mouth daily. 12/20/19   Iloabachie, Chioma E, NP  colchicine 0.6 MG tablet Take 1 tablet (0.6 mg total) by mouth daily as needed. 08/01/19   Clapacs, Madie Reno, MD  Fluticasone-Salmeterol (ADVAIR DISKUS) 100-50 MCG/DOSE AEPB Inhale 1 puff into the lungs 2 (two) times daily. 09/01/18   Tukov-Yual, Arlyss Gandy, NP  folic acid (FOLVITE) 1 MG tablet Take 1 tablet (1 mg total) by mouth daily. 01/08/20   Iloabachie, Chioma E, NP  hydrOXYzine (ATARAX/VISTARIL) 25 MG tablet Take 1 tablet (25 mg total) by mouth 3 (three) times daily as needed for anxiety. Patient not taking: Reported on 02/28/2020 08/01/19   Clapacs, Madie Reno, MD  lisinopril (ZESTRIL) 40 MG tablet Take 1 tablet (40 mg total) by mouth daily. 10/26/19   Iloabachie, Chioma E, NP  Multiple Vitamins-Iron (MULTIVITAMINS WITH IRON) TABS tablet Take 1 tablet by mouth daily. 08/01/19   Clapacs, Madie Reno, MD  pantoprazole (PROTONIX) 40 MG tablet Take 1 tablet (40 mg total) by mouth daily. 01/08/20   Iloabachie, Chioma E, NP  QUEtiapine (SEROQUEL) 50 MG tablet Take 1 tablet (50 mg total) by mouth at bedtime. 10/14/19   Patrecia Pour, NP  thiamine 250 MG tablet Take 1 tablet (250 mg total) by mouth daily. Patient not taking: Reported on 02/15/2020 08/01/19   Clapacs, Madie Reno, MD    Allergies    Percocet [oxycodone-acetaminophen]  Review of Systems   Review of Systems  Gastrointestinal: Positive for abdominal pain and vomiting.  All other systems reviewed and are negative.   Physical Exam Updated Vital Signs BP (!) 161/103   Pulse (!) 122   Temp 98.6 F (37 C) (Oral)   Resp 13   Ht 6' (1.829 m)   Wt 81.6 kg   SpO2 97%   BMI 24.41 kg/m   Physical Exam Vitals and nursing note reviewed.  Constitutional:       Comments: Anxious, vomiting, tearful   HENT:     Head: Normocephalic.     Mouth/Throat:     Mouth: Mucous membranes are dry.  Eyes:     Extraocular Movements: Extraocular movements intact.     Pupils: Pupils are equal, round, and reactive to light.  Cardiovascular:     Rate and Rhythm: Regular rhythm. Tachycardia present.  Pulmonary:     Effort: Pulmonary effort is normal.     Breath sounds: Normal breath sounds.  Abdominal:     General: Abdomen is flat.     Comments: + LUQ tenderness, mild epigastric tenderness as well   Musculoskeletal:        General: Normal range of motion.  Cervical back: Normal range of motion.  Skin:    General: Skin is warm.     Capillary Refill: Capillary refill takes less than 2 seconds.  Neurological:     General: No focal deficit present.     Mental Status: He is oriented to person, place, and time.  Psychiatric:        Mood and Affect: Mood normal.        Behavior: Behavior normal.     ED Results / Procedures / Treatments   Labs (all labs ordered are listed, but only abnormal results are displayed) Labs Reviewed  COMPREHENSIVE METABOLIC PANEL - Abnormal; Notable for the following components:      Result Value   Glucose, Bld 121 (*)    Creatinine, Ser 1.33 (*)    Total Protein 8.2 (*)    AST 44 (*)    GFR calc non Af Amer 58 (*)    All other components within normal limits  ETHANOL - Abnormal; Notable for the following components:   Alcohol, Ethyl (B) 233 (*)    All other components within normal limits  SALICYLATE LEVEL - Abnormal; Notable for the following components:   Salicylate Lvl [id number] (*)    All other components within normal limits  ACETAMINOPHEN LEVEL - Abnormal; Notable for the following components:   Acetaminophen (Tylenol), Serum <10 (*)    All other components within normal limits  CBC - Abnormal; Notable for the following components:   RDW 16.9 (*)    All other components within normal limits  URINE DRUG SCREEN,  QUALITATIVE ([facility] ONLY)  LIPASE, BLOOD    EKG EKG Interpretation  Date/Time:  Sunday March 03 2020 19:34:28 EDT Ventricular Rate:  130 PR Interval:  150 QRS Duration: 88 QT Interval:  292 QTC Calculation: 429 R Axis:   -10 Text Interpretation: Sinus tachycardia Low voltage QRS Inferior infarct (cited on or before 13-Jun-2019) Cannot rule out Anterior infarct (cited on or before 13-Jun-2019) ST & T wave abnormality, consider lateral ischemia Abnormal ECG When compared with ECG of 19-Jul-2019 01:07, ST now depressed in Lateral leads Confirmed by Wandra Arthurs [phone]) on 03/03/2020 9:00:34 PM   Radiology No results found.  Procedures Procedures (including critical care time)  Medications Ordered in ED Medications  sodium chloride 0.9 % bolus 1,000 mL (has no administration in time range)  LORazepam (ATIVAN) injection 2 mg (has no administration in time range)  ondansetron (ZOFRAN) injection 4 mg (has no administration in time range)    ED Course  I have reviewed the triage vital signs and the nursing notes.  Pertinent labs & imaging results that were available during my care of the patient were reviewed by me and considered in my medical decision making (see chart for details).    MDM Rules/Calculators/A&P                      Miguel [name] is a 59 y.o. male is here with abdominal pain and vomiting.  Also has anxiety and depression as well. Patient is a known chronic alcoholic .  Vomiting at bedside and appears uncomfortable.  Will get labs and CT abdomen pelvis.  Will start on CIWA protocol and give IV fluids and Zofran.  We will also consult psych and TTS to see patient.  Additional history obtained:  Previous records obtained and reviewed   Lab Tests:  I Ordered, reviewed, and interpreted labs, which included:  CBC, CMP, ETOH, UDS  Imaging Studies  ordered:  I ordered imaging studies which included CT ab/pel, I independently visualized and interpreted imaging which showed  no acute process  Medicines ordered:  I ordered medication zofran, ativan, IVF  For alcohol withdrawal, dehydration, vomiting   11:10 PM Labs showed alcohol level 233.  CT abdomen pelvis unremarkable.  Patient is depressed so will consult TTS and psych.  Patient still tachycardic 117.  Initial CIWA was 15.  Started on CIWA protocol.  Signed out to Dr. Owens Shark to follow-up TTS SI consult and reassess patient.      Final Clinical Impression(s) / ED Diagnoses Final diagnoses:  None    Rx / DC Orders ED Discharge Orders    None       Drenda Freeze, MD 03/03/20 2311

## 2020-03-03 NOTE — ED Notes (Signed)
Pt in CT.

## 2020-03-03 NOTE — ED Notes (Signed)
Need to call behavioral health counselor once medically cleared [phone]

## 2020-03-03 NOTE — ED Notes (Addendum)
Pt states he started drinking wine five days ago and is here c/o N/V, anorexia, tremors, and anxiety. Pt denies hallucinations and visual disturbances.  Pt is AOx4, tremulous. Skin is warm and dry.

## 2020-03-03 NOTE — ED Notes (Signed)
Pt reports that he has not been taking his home medications for last few days due to N/V.

## 2020-03-04 ENCOUNTER — Encounter: Payer: Self-pay | Admitting: Internal Medicine

## 2020-03-04 ENCOUNTER — Other Ambulatory Visit: Payer: Self-pay

## 2020-03-04 DIAGNOSIS — F10230 Alcohol dependence with withdrawal, uncomplicated: Secondary | ICD-10-CM

## 2020-03-04 DIAGNOSIS — F10239 Alcohol dependence with withdrawal, unspecified: Secondary | ICD-10-CM | POA: Diagnosis present

## 2020-03-04 DIAGNOSIS — F10939 Alcohol use, unspecified with withdrawal, unspecified: Secondary | ICD-10-CM | POA: Diagnosis present

## 2020-03-04 LAB — PHOSPHORUS
Phosphorus: 3.7 mg/dL (ref 2.5–4.6)
Phosphorus: 1.8 mg/dL — ABNORMAL LOW (ref 2.5–4.6)

## 2020-03-04 LAB — SARS CORONAVIRUS 2 (TAT 6-24 HRS): SARS Coronavirus 2: NEGATIVE

## 2020-03-04 LAB — URINE DRUG SCREEN, QUALITATIVE (ARMC ONLY)
Amphetamines, Ur Screen: NOT DETECTED
Barbiturates, Ur Screen: NOT DETECTED
Benzodiazepine, Ur Scrn: NOT DETECTED
Cannabinoid 50 Ng, Ur ~~LOC~~: NOT DETECTED
Cocaine Metabolite,Ur ~~LOC~~: NOT DETECTED
MDMA (Ecstasy)Ur Screen: NOT DETECTED
Methadone Scn, Ur: NOT DETECTED
Opiate, Ur Screen: NOT DETECTED
Phencyclidine (PCP) Ur S: NOT DETECTED
Tricyclic, Ur Screen: NOT DETECTED

## 2020-03-04 LAB — MAGNESIUM: Magnesium: 1.6 mg/dL — ABNORMAL LOW (ref 1.7–2.4)

## 2020-03-04 MED ORDER — ACETAMINOPHEN 325 MG PO TABS: 650.0000 mg | ORAL_TABLET | Freq: Four times a day (QID) | ORAL | Status: DC | PRN

## 2020-03-04 MED ORDER — LORAZEPAM 2 MG/ML IJ SOLN
1.0000 mg | Freq: Once | INTRAMUSCULAR | Status: AC
  Administered 2020-03-04: 1 mg via INTRAVENOUS
  Filled 2020-03-04: qty 1

## 2020-03-04 MED ORDER — ADULT MULTIVITAMIN W/MINERALS CH
1.0000 | ORAL_TABLET | Freq: Every day | ORAL | Status: DC
  Administered 2020-03-04: 1 via ORAL
  Filled 2020-03-04: qty 1

## 2020-03-04 MED ORDER — ENOXAPARIN SODIUM 40 MG/0.4ML ~~LOC~~ SOLN
40.0000 mg | SUBCUTANEOUS | Status: DC
  Administered 2020-03-04: 12:00:00 40 mg via SUBCUTANEOUS
  Filled 2020-03-04: qty 0.4

## 2020-03-04 MED ORDER — DEXTROSE-NACL 5-0.9 % IV SOLN: INTRAVENOUS | Status: DC

## 2020-03-04 MED ORDER — LORAZEPAM 2 MG/ML IJ SOLN
2.0000 mg | Freq: Once | INTRAMUSCULAR | Status: AC
  Administered 2020-03-04: 2 mg via INTRAVENOUS
  Filled 2020-03-04: qty 1

## 2020-03-04 MED ORDER — LORAZEPAM 2 MG/ML IJ SOLN
1.0000 mg | INTRAMUSCULAR | Status: DC | PRN
  Administered 2020-03-04 (×2): 2 mg via INTRAVENOUS

## 2020-03-04 MED ORDER — ONDANSETRON HCL 4 MG/2ML IJ SOLN: 4.0000 mg | Freq: Four times a day (QID) | INTRAMUSCULAR | Status: DC | PRN

## 2020-03-04 MED ORDER — THIAMINE HCL 100 MG PO TABS: 100.0000 mg | ORAL_TABLET | Freq: Every day | ORAL | Status: DC

## 2020-03-04 MED ORDER — LORAZEPAM 1 MG PO TABS: 1.0000 mg | ORAL_TABLET | ORAL | Status: DC | PRN

## 2020-03-04 MED ORDER — THIAMINE HCL 100 MG/ML IJ SOLN: 100.0000 mg | Freq: Every day | INTRAMUSCULAR | Status: DC

## 2020-03-04 MED ORDER — MAGNESIUM SULFATE 2 GM/50ML IV SOLN: 2.0000 g | Freq: Once | INTRAVENOUS | Status: DC

## 2020-03-04 MED ORDER — FOLIC ACID 1 MG PO TABS: 1.0000 mg | ORAL_TABLET | Freq: Every day | ORAL | Status: DC

## 2020-03-04 MED ORDER — FOLIC ACID 1 MG PO TABS
1.0000 mg | ORAL_TABLET | Freq: Every day | ORAL | Status: DC
  Administered 2020-03-04: 11:00:00 1 mg via ORAL
  Filled 2020-03-04: qty 1

## 2020-03-04 MED ORDER — PANTOPRAZOLE SODIUM 40 MG IV SOLR
40.0000 mg | Freq: Every day | INTRAVENOUS | Status: DC
  Administered 2020-03-04: 40 mg via INTRAVENOUS
  Filled 2020-03-04: qty 40

## 2020-03-04 MED ORDER — LORAZEPAM 2 MG/ML IJ SOLN: 0.0000 mg | Freq: Two times a day (BID) | INTRAMUSCULAR | Status: DC

## 2020-03-04 MED ORDER — ACETAMINOPHEN 650 MG RE SUPP: 650.0000 mg | Freq: Four times a day (QID) | RECTAL | Status: DC | PRN

## 2020-03-04 MED ORDER — LORAZEPAM 2 MG/ML IJ SOLN
1.0000 mg | INTRAMUSCULAR | Status: DC | PRN
  Filled 2020-03-04 (×2): qty 1

## 2020-03-04 MED ORDER — THIAMINE HCL 100 MG PO TABS
100.0000 mg | ORAL_TABLET | Freq: Every day | ORAL | Status: DC
  Administered 2020-03-04: 11:00:00 100 mg via ORAL

## 2020-03-04 MED ORDER — LORAZEPAM 2 MG/ML IJ SOLN
0.0000 mg | Freq: Four times a day (QID) | INTRAMUSCULAR | Status: DC
  Administered 2020-03-04: 1 mg via INTRAVENOUS
  Filled 2020-03-04: qty 1

## 2020-03-04 MED ORDER — ADULT MULTIVITAMIN W/MINERALS CH: 1.0000 | ORAL_TABLET | Freq: Every day | ORAL | Status: DC

## 2020-03-04 MED ORDER — ONDANSETRON HCL 4 MG PO TABS: 4.0000 mg | ORAL_TABLET | Freq: Four times a day (QID) | ORAL | Status: DC | PRN

## 2020-03-04 NOTE — ED Notes (Signed)
TTS has consulted with Doctor Owens Shark to review the pt and the pts appropriateness for detox referral.  Pt has been medically cleared although it is unclear as to wether he is requesting assistance with arranging detox or rehab. It is recommended that the pt follow up with [facility] as he is currently requesting outpatient support.

## 2020-03-04 NOTE — Progress Notes (Signed)
Pt given extensive education regarding alcohol withdrawal and needing to stay in the hospital to be medically managed through the acute phase of withdrawal. Despite education provided by both the physician and this nurse, Pt has chosen to leave against medical advice. At this time patient is alert and oriented to person place and time. He is having visible tremors and gait is unsteady. Pt states he needs to go home to his wife, whom he is the primary provider for. Paper form signed and patient has called for a cab and is currently waiting for transportation. IV and telemetry removed.

## 2020-03-04 NOTE — Progress Notes (Addendum)
Patient said he feels better but he still has some tremors.  He is also unsteady on his feet when he walks to the bathroom even with assistance.  He is tachycardic and hypertensive.  He is alert, oriented to person, place, time and situation.  Physical exam is significant for fine tremors of bilateral hands.  Continue IV fluids for hydration and AKI.  Replete magnesium with IV magnesium sulfate.  Continue IV Ativan per CIWA protocol.  Unfortunately, patient wants to go home today.  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  Patient was advised that he is not medically stable for discharge and is unsafe for him to go home at this time.  He is at high risk for falls.  He insisted that he might have to go home today.  He was told that the other option will be to leave [facility] which is not a better option.  He was informed of the risks of leaving [facility] including but not limited to falls resulted in significant bodily injury, seizures, delirium tremens, respiratory failure, cardiopulmonary arrest and death.  His nurse, Gregary Signs, was present at the bedside during this encounter.  We both pleaded with him to stay in the hospital and case manager has been notified to see if there is any assistance that can be provided to the patient.

## 2020-03-04 NOTE — H&P (Signed)
History and Physical    Miguel [name] [id number] DOB: [birth year] DOA: 03/03/2020  PCP: Langston Reusing, NP   Patient coming from: Home  I have personally briefly reviewed patient's old medical records in [place]  Chief Complaint: Tremors, vomiting stopping drinking  HPI: Miguel [name] is a 59 y.o. male with medical history significant for alcohol use disorder, hypertension, gout, COPD recently diagnosed with alcohol induced mood disorder who has been drinking heavily up outlined today but quit drinking 3 days.  He now complains of vomiting, tremors and anxiety.  Has mild intensity right upper quadrant pain, nonradiating with no aggravating or alleviating symptoms,.  Denies hallucinations  ED Course: In the emergency room he was tachycardic at 129 with otherwise normal vitals.  Blood work significant for slightly elevated AST of 44 ALT 58.  Urine drug screen acetaminophen and salicylate acid negative.  Chemistries unremarkable.  He had a CT abdomen and pelvis that showed fatty infiltration of the liver but no acute findings.  Chest x-ray with no acute disease.  Patient started on lorazepam.  Hospitalist consulted for admission  Review of Systems: As per HPI otherwise 10 point review of systems negative.    Past Medical History:  Diagnosis Date  . Alcohol abuse   . Anxiety   . Asthma   . GERD (gastroesophageal reflux disease)   . Gout   . Hypertension   . Kidney stone   . OCD (obsessive compulsive disorder)   . Renal colic     Past Surgical History:  Procedure Laterality Date  . CHOLECYSTECTOMY  2012  . EXTRACORPOREAL SHOCK WAVE LITHOTRIPSY Left 01/12/2019   Procedure: EXTRACORPOREAL SHOCK WAVE LITHOTRIPSY (ESWL);  Surgeon: Billey Co, MD;  Location: [facility] ORS;  Service: Urology;  Laterality: Left;     reports that he has quit smoking. His smoking use included cigarettes. He has never used smokeless tobacco. He reports current alcohol use of about 12.0  standard drinks of alcohol per week. He reports that he does not use drugs.  Allergies  Allergen Reactions  . Percocet [Oxycodone-Acetaminophen] Nausea And Vomiting    Family History  Problem Relation Age of Onset  . Alcohol abuse Father   . Breast cancer Mother 39     Prior to Admission medications   Medication Sig Start Date End Date Taking? Authorizing Provider  albuterol (VENTOLIN HFA) 108 (90 Base) MCG/ACT inhaler Inhale 2 puffs into the lungs every 6 (six) hours as needed.    [provider]  allopurinol (ZYLOPRIM) 300 MG tablet Take 1 tablet (300 mg total) by mouth daily. 02/28/20   Iloabachie, Chioma E, NP  ascorbic acid (VITAMIN C) 500 MG tablet Take 1 tablet (500 mg total) by mouth daily. 08/01/19   Clapacs, Madie Reno, MD  citalopram (CELEXA) 20 MG tablet Take 1 tablet (20 mg total) by mouth daily. 12/21/19   Sable Feil, PA-C  citalopram (CELEXA) 20 MG tablet Take 1 tablet (20 mg total) by mouth daily. 12/21/19 12/20/20  Sable Feil, PA-C  cloNIDine (CATAPRES) 0.1 MG tablet Take 1 tablet (0.1 mg total) by mouth daily. 12/20/19   Iloabachie, Chioma E, NP  colchicine 0.6 MG tablet Take 1 tablet (0.6 mg total) by mouth daily as needed. 08/01/19   Clapacs, Madie Reno, MD  Fluticasone-Salmeterol (ADVAIR DISKUS) 100-50 MCG/DOSE AEPB Inhale 1 puff into the lungs 2 (two) times daily. 09/01/18   Tukov-Yual, Arlyss Gandy, NP  folic acid (FOLVITE) 1 MG tablet Take 1  tablet (1 mg total) by mouth daily. 01/08/20   Iloabachie, Chioma E, NP  hydrOXYzine (ATARAX/VISTARIL) 25 MG tablet Take 1 tablet (25 mg total) by mouth 3 (three) times daily as needed for anxiety. Patient not taking: Reported on 02/28/2020 08/01/19   Clapacs, Madie Reno, MD  lisinopril (ZESTRIL) 40 MG tablet Take 1 tablet (40 mg total) by mouth daily. 10/26/19   Iloabachie, Chioma E, NP  Multiple Vitamins-Iron (MULTIVITAMINS WITH IRON) TABS tablet Take 1 tablet by mouth daily. 08/01/19   Clapacs, Madie Reno, MD  pantoprazole (PROTONIX) 40  MG tablet Take 1 tablet (40 mg total) by mouth daily. 01/08/20   Iloabachie, Chioma E, NP  QUEtiapine (SEROQUEL) 50 MG tablet Take 1 tablet (50 mg total) by mouth at bedtime. 10/14/19   Patrecia Pour, NP  thiamine 250 MG tablet Take 1 tablet (250 mg total) by mouth daily. Patient not taking: Reported on 02/15/2020 08/01/19   Gonzella Lex, MD    Physical Exam: Vitals:   03/04/20 0030 03/04/20 0100 03/04/20 0130 03/04/20 0203  BP: (!) 142/76 (!) 142/81 (!) 144/86 (!) 166/95  Pulse: (!) 120 (!) 117 (!) 115 (!) 122  Resp: 15     Temp:      TempSrc:      SpO2: 94%  93%   Weight:      Height:         Vitals:   03/04/20 0030 03/04/20 0100 03/04/20 0130 03/04/20 0203  BP: (!) 142/76 (!) 142/81 (!) 144/86 (!) 166/95  Pulse: (!) 120 (!) 117 (!) 115 (!) 122  Resp: 15     Temp:      TempSrc:      SpO2: 94%  93%   Weight:      Height:        Constitutional: Alert and awake, oriented x3, not in any acute distress. Eyes: PERLA, EOMI, irises appear normal, anicteric sclera,  ENMT: external ears and nose appear normal, normal hearing             Lips appears normal, oropharynx mucosa, tongue, posterior pharynx appear normal  Neck: neck appears normal, no masses, normal ROM, no thyromegaly, no JVD  CVS: S1-S2 clear, no murmur rubs or gallops,  , no carotid bruits, pedal pulses palpable, No LE edema Respiratory:  clear to auscultation bilaterally, no wheezing, rales or rhonchi. Respiratory effort normal. No accessory muscle use.  Abdomen: soft nontender, nondistended, normal bowel sounds, no hepatosplenomegaly, no hernias Musculoskeletal: : no cyanosis, clubbing , no contractures or atrophy Neuro: Cranial nerves II-XII intact, sensation, reflexes normal, strength Psych: judgement and insight appear normal, stable mood and affect,  Skin: no rashes or lesions or ulcers, no induration or nodules   Labs on Admission: I have personally reviewed following labs and imaging  studies  CBC: Recent Labs  Lab 03/03/20 1940  WBC 8.0  HGB 15.0  HCT 45.3  MCV 88.0  PLT [id number]   Basic Metabolic Panel: Recent Labs  Lab 03/03/20 1940  NA 141  K 4.5  CL 102  CO2 24  GLUCOSE 121*  BUN 20  CREATININE 1.33*  CALCIUM 9.3   GFR: Estimated Creatinine Clearance: 64.8 mL/min (A) (by C-G formula based on SCr of 1.33 mg/dL (H)). Liver Function Tests: Recent Labs  Lab 03/03/20 1940  AST 44*  ALT 36  ALKPHOS 94  BILITOT 1.0  PROT 8.2*  ALBUMIN 4.7   Recent Labs  Lab 03/03/20 1940  LIPASE 34   No results  for input(s): AMMONIA in the last 168 hours. Coagulation Profile: No results for input(s): INR, PROTIME in the last 168 hours. Cardiac Enzymes: No results for input(s): CKTOTAL, CKMB, CKMBINDEX, TROPONINI in the last 168 hours. BNP (last 3 results) No results for input(s): PROBNP in the last 8760 hours. HbA1C: No results for input(s): HGBA1C in the last 72 hours. CBG: No results for input(s): GLUCAP in the last 168 hours. Lipid Profile: No results for input(s): CHOL, HDL, LDLCALC, TRIG, CHOLHDL, LDLDIRECT in the last 72 hours. Thyroid Function Tests: No results for input(s): TSH, T4TOTAL, FREET4, T3FREE, THYROIDAB in the last 72 hours. Anemia Panel: No results for input(s): VITAMINB12, FOLATE, FERRITIN, TIBC, IRON, RETICCTPCT in the last 72 hours. Urine analysis:    Component Value Date/Time   COLORURINE STRAW (A) 11/13/2019 1530   APPEARANCEUR CLEAR (A) 11/13/2019 1530   APPEARANCEUR Hazy 06/26/2014 2238   LABSPEC 1.003 (L) 11/13/2019 1530   LABSPEC 1.036 06/26/2014 2238   PHURINE 6.0 11/13/2019 1530   GLUCOSEU NEGATIVE 11/13/2019 1530   GLUCOSEU Negative 06/26/2014 2238   HGBUR NEGATIVE 11/13/2019 1530   BILIRUBINUR NEGATIVE 11/13/2019 1530   BILIRUBINUR 1+ 06/26/2014 2238   KETONESUR NEGATIVE 11/13/2019 1530   PROTEINUR NEGATIVE 11/13/2019 1530   NITRITE NEGATIVE 11/13/2019 1530   LEUKOCYTESUR MODERATE (A) 11/13/2019 1530    LEUKOCYTESUR 1+ 06/26/2014 2238    Radiological Exams on Admission: CT ABDOMEN PELVIS W CONTRAST  Result Date: 03/03/2020 CLINICAL DATA:  Nausea, vomiting, abdominal distention EXAM: CT ABDOMEN AND PELVIS WITH CONTRAST TECHNIQUE: Multidetector CT imaging of the abdomen and pelvis was performed using the standard protocol following bolus administration of intravenous contrast. CONTRAST:  156mL OMNIPAQUE IOHEXOL 300 MG/ML  SOLN COMPARISON:  11/13/2019 FINDINGS: Lower chest: Lung bases are clear. No effusions. Heart is normal size. Hepatobiliary: Prior cholecystectomy. Diffuse fatty infiltration of the liver. No focal hepatic abnormality. Pancreas: No focal abnormality or ductal dilatation. Spleen: No focal abnormality.  Normal size. Adrenals/Urinary Tract: Bilateral nonobstructing renal stones. No ureteral stones or hydronephrosis. Urinary bladder and adrenal glands unremarkable. Stomach/Bowel: Normal appendix. Sigmoid diverticulosis. No active diverticulitis. Mild wall thickening in the cecum and ascending colon with mural stratification which could be related to old inflammatory bowel disease or liver disease. No acute inflammatory process. Vascular/Lymphatic: No evidence of aneurysm or adenopathy. Reproductive: Prostate enlargement with central calcifications. Other: No free fluid or free air. Musculoskeletal: No acute bony abnormality. IMPRESSION: Diffuse fatty infiltration of the liver. Bilateral nephrolithiasis.  No ureteral stones or hydronephrosis. Sigmoid diverticulosis. No acute findings in the abdomen or pelvis. Electronically Signed   By: Rolm Baptise M.D.   On: 03/03/2020 22:32   DG Chest Port 1 View  Result Date: 03/03/2020 CLINICAL DATA:  59 year old male with cough EXAM: PORTABLE CHEST 1 VIEW COMPARISON:  Chest radiograph dated 02/15/2020. FINDINGS: Minimal peribronchial densities may represent reactive small airway disease versus viral infection. Clinical correlation is recommended no focal  consolidation, pleural effusion, or pneumothorax. The cardiac silhouette is within normal limits. Atherosclerotic calcification of the aortic arch. Old left anterior rib fracture deformity. No acute osseous pathology. IMPRESSION: No focal consolidation. Electronically Signed   By: Anner Crete M.D.   On: 03/03/2020 21:25    EKG: Independently reviewed.   Assessment/Plan      Alcohol withdrawal syndrome without complication (HCC)   Alcohol abuse with alcohol-induced mood disorder ([place]) -CIWA withdrawal protocol -Fall aspiration and seizure precautions -Protonix IV -IV antiemetics and IV hydration  Gout -Continue colchicine and allopurinol  COPD -Continue  Advair and albuterol as needed    GERD (gastroesophageal reflux disease) -Protonix    DVT prophylaxis: Lovenox  Code Status: full code  Family Communication:  none  Disposition Plan: Back to previous home environment Consults called: none  Status:obs    Athena Masse MD [facility]     03/04/2020, 2:34 AM

## 2020-03-04 NOTE — ED Notes (Signed)
Out of d5ns infusion, waiting on restock of supply.

## 2020-03-04 NOTE — ED Notes (Signed)
Pt continuing to have tremors, Sinus tach on monitor. Dr. Owens Shark notified, orders to be placed

## 2020-03-04 NOTE — BH Assessment (Signed)
Patient is medicated at the time of assessment. Will attempt later.

## 2020-03-04 NOTE — ED Provider Notes (Signed)
I assumed care of this patient from Dr. Darl Householder at 11:00 PM.  Patient with continued tremors and tachycardia current heart rate 122.  Despite receiving multiple doses of IV Ativan and normal saline patient remains tachycardic.  Concern for alcohol withdrawal.  As such patient admitted to the hospitalist for further evaluation and management.  Patient discussed with Dr. Gearldine Bienenstock, Valli Glance, MD 03/04/20 [phone]

## 2020-03-04 NOTE — Plan of Care (Signed)
Patient left against medical advice.

## 2020-03-04 NOTE — ED Notes (Signed)
Po fluids and snack provided to pt. Call bell given to pt and is on right side. Pt denies other needs, urinal at side as well.

## 2020-03-04 NOTE — TOC Transition Note (Signed)
Transition of Care [facility]) - CM/SW Discharge Note   Patient Details  Name: Miguel [name] MRN: [id number] Date of Birth: 1961/07/10  [facility] CM/SW Contact:  Shelbie Hutching, RN Phone Number: 03/04/2020, 1:54 PM   Clinical Narrative:    Patient placed under observation for alcohol withdrawal.  RNCM introduced self and explained role, patient verbalized understanding.  Patient came into the hospital for help with alcoholism but patient reports that he needs to go home and he is leaving against medical advice.  Patient reports that his wife is at home and has MS.  Patient reports that he has to be there for her, she should not be alone.  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  Patient is active with [facility], last seen at [facility] on 4/14.  Patient has also been active with [facility] in the past and he reports that he has it set up again for him to be seen with them again coming up.   MD, RN, and RNCM cannot convince patient to stay in the hospital, he called a cab and is leaving against medical advice.    Final next level of care: Against Medical Advice     Patient Goals and CMS Choice Patient states their goals for this hospitalization and ongoing recovery are:: Patient just wants to go home      Discharge Placement                       Discharge Plan and Services   Discharge Planning Services: CM Consult                                 Social Determinants of Health (SDOH) Interventions     Readmission Risk Interventions Readmission Risk Prevention Plan 05/28/2019 05/08/2019 03/09/2019  Transportation Screening Complete Complete Complete  Medication Review Press photographer) Complete Complete Complete  PCP or Specialist appointment within 3-5 days of discharge - Complete Complete  HRI or Home Care Consult Complete (No Data) Not  Complete  HRI or Home Care Consult Pt Refusal Comments - - not indicated   SW Recovery Care/Counseling Consult - - Not Complete  SW Consult Not Complete Comments - - not indicated   Palliative Care Screening Not Applicable Not Applicable Not [place] Not Applicable Not Applicable Not Applicable  Some recent data might be hidden

## 2020-03-06 NOTE — Discharge Summary (Signed)
Patient left the hospital [place]

## 2020-03-11 ENCOUNTER — Emergency Department
Admission: EM | Admit: 2020-03-11 | Discharge: 2020-03-12 | Disposition: A | Discharge status: Home / Self Care | Payer: Medicaid Other | Attending: Emergency Medicine | Admitting: Emergency Medicine

## 2020-03-11 ENCOUNTER — Other Ambulatory Visit: Payer: Self-pay

## 2020-03-11 DIAGNOSIS — J45909 Unspecified asthma, uncomplicated: Secondary | ICD-10-CM | POA: Insufficient documentation

## 2020-03-11 DIAGNOSIS — I10 Essential (primary) hypertension: Secondary | ICD-10-CM | POA: Insufficient documentation

## 2020-03-11 DIAGNOSIS — F10129 Alcohol abuse with intoxication, unspecified: Secondary | ICD-10-CM | POA: Insufficient documentation

## 2020-03-11 DIAGNOSIS — F1010 Alcohol abuse, uncomplicated: Secondary | ICD-10-CM

## 2020-03-11 DIAGNOSIS — Y908 Blood alcohol level of 240 mg/100 ml or more: Secondary | ICD-10-CM | POA: Insufficient documentation

## 2020-03-11 DIAGNOSIS — Z87891 Personal history of nicotine dependence: Secondary | ICD-10-CM | POA: Insufficient documentation

## 2020-03-11 DIAGNOSIS — Z79899 Other long term (current) drug therapy: Secondary | ICD-10-CM | POA: Insufficient documentation

## 2020-03-11 LAB — URINE DRUG SCREEN, QUALITATIVE (ARMC ONLY)
Amphetamines, Ur Screen: NOT DETECTED
Barbiturates, Ur Screen: NOT DETECTED
Benzodiazepine, Ur Scrn: NOT DETECTED
Cannabinoid 50 Ng, Ur ~~LOC~~: POSITIVE — AB
Cocaine Metabolite,Ur ~~LOC~~: NOT DETECTED
MDMA (Ecstasy)Ur Screen: NOT DETECTED
Methadone Scn, Ur: NOT DETECTED
Opiate, Ur Screen: NOT DETECTED
Phencyclidine (PCP) Ur S: NOT DETECTED
Tricyclic, Ur Screen: NOT DETECTED

## 2020-03-11 LAB — ETHANOL: Alcohol, Ethyl (B): 336 mg/dL (ref <10)

## 2020-03-11 LAB — CBC WITH DIFFERENTIAL/PLATELET
Abs Immature Granulocytes: 0.03 10*3/uL (ref 0.00–0.07)
Basophils Absolute: 0.1 10*3/uL (ref 0.0–0.1)
Basophils Relative: 1 %
Eosinophils Absolute: 0.1 10*3/uL (ref 0.0–0.5)
Eosinophils Relative: 1 %
HCT: 41.1 % (ref 39.0–52.0)
Hemoglobin: 13.6 g/dL (ref 13.0–17.0)
Immature Granulocytes: 0 %
Lymphocytes Relative: 23 %
Lymphs Abs: 1.7 10*3/uL (ref 0.7–4.0)
MCH: 29.2 pg (ref 26.0–34.0)
MCHC: 33.1 g/dL (ref 30.0–36.0)
MCV: 88.2 fL (ref 80.0–100.0)
Monocytes Absolute: 0.5 10*3/uL (ref 0.1–1.0)
Monocytes Relative: 7 %
Neutro Abs: 4.9 10*3/uL (ref 1.7–7.7)
Neutrophils Relative %: 68 %
Platelets: 197 10*3/uL (ref 150–400)
RBC: 4.66 MIL/uL (ref 4.22–5.81)
RDW: 17 % — ABNORMAL HIGH (ref 11.5–15.5)
WBC: 7.4 10*3/uL (ref 4.0–10.5)
nRBC: 0 % (ref 0.0–0.2)

## 2020-03-11 LAB — BASIC METABOLIC PANEL
Anion gap: 8 (ref 5–15)
BUN: 15 mg/dL (ref 6–20)
CO2: 30 mmol/L (ref 22–32)
Calcium: 8.9 mg/dL (ref 8.9–10.3)
Chloride: 104 mmol/L (ref 98–111)
Creatinine, Ser: 0.88 mg/dL (ref 0.61–1.24)
GFR calc Af Amer: >60 mL/min (ref >60)
GFR calc non Af Amer: >60 mL/min (ref >60)
Glucose, Bld: 90 mg/dL (ref 70–99)
Potassium: 3.8 mmol/L (ref 3.5–5.1)
Sodium: 142 mmol/L (ref 135–145)

## 2020-03-11 MED ORDER — LORAZEPAM 2 MG/ML IJ SOLN
INTRAMUSCULAR | Status: AC
  Filled 2020-03-11: qty 1

## 2020-03-11 MED ORDER — LORAZEPAM 2 MG/ML IJ SOLN
2.0000 mg | Freq: Once | INTRAMUSCULAR | Status: AC
  Administered 2020-03-11: 2 mg via INTRAVENOUS

## 2020-03-11 MED ORDER — THIAMINE HCL 100 MG PO TABS
100.0000 mg | ORAL_TABLET | Freq: Every day | ORAL | Status: DC
  Administered 2020-03-12: 100 mg via ORAL
  Filled 2020-03-11: qty 1

## 2020-03-11 MED ORDER — LORAZEPAM 2 MG/ML IJ SOLN
0.0000 mg | Freq: Four times a day (QID) | INTRAMUSCULAR | Status: DC
  Administered 2020-03-11 – 2020-03-12 (×2): 2 mg via INTRAVENOUS
  Filled 2020-03-11 (×2): qty 1

## 2020-03-11 MED ORDER — LORAZEPAM 2 MG/ML IJ SOLN: 0.0000 mg | Freq: Two times a day (BID) | INTRAMUSCULAR | Status: DC

## 2020-03-11 MED ORDER — LORAZEPAM 2 MG PO TABS
0.0000 mg | ORAL_TABLET | Freq: Four times a day (QID) | ORAL | Status: DC
  Administered 2020-03-12: 2 mg via ORAL
  Filled 2020-03-11: qty 1

## 2020-03-11 MED ORDER — LORAZEPAM 2 MG PO TABS: 0.0000 mg | ORAL_TABLET | Freq: Two times a day (BID) | ORAL | Status: DC

## 2020-03-11 MED ORDER — THIAMINE HCL 100 MG/ML IJ SOLN
100.0000 mg | Freq: Every day | INTRAMUSCULAR | Status: DC
  Administered 2020-03-11: 100 mg via INTRAVENOUS
  Filled 2020-03-11: qty 2

## 2020-03-11 NOTE — ED Notes (Signed)
VOL/No Psych Consult ordered

## 2020-03-11 NOTE — ED Notes (Signed)
Pt urinated on self and taken pants off in hallway. Pt assisted by this nurse to put on new scrub pants to be dry. Pt states while changing that he tried to come in earlier and he had "a few" drinks today. Would not specify the exact number or of what.

## 2020-03-11 NOTE — ED Triage Notes (Signed)
FIRST NURSE NOTE- EMS called for fall. Pt denies any pain.  Last drink this AM. No visible injuries per EMS

## 2020-03-11 NOTE — ED Notes (Signed)
Pt continues to take off clothes and blanket and grab his own penis and testicles and hold them. Pt is unable to communicate accurately, as before. Pt is attempting to get out of bed but is very unsteady.

## 2020-03-11 NOTE — ED Provider Notes (Signed)
[facility] Emergency Department Provider Note ____________________________________________   First MD Initiated Contact with Patient 03/11/20 1741     (approximate)  I have reviewed the triage vital signs and the nursing notes.   HISTORY  Chief Complaint Alcohol Intoxication  Level 5 caveat: History of present illness limited due to alcohol intoxication  HPI Miguel [name] is a 59 y.o. male with PMH as noted below who presents for unclear reasons.  He initially stated that he had not had anything to drink today and that he was here because he had the shakes, however he then stated to me that he did drink a whole bottle of wine.  There was some history per EMS of a fall, however the patient is unable to give any history about this.  Past Medical History:  Diagnosis Date  . Alcohol abuse   . Anxiety   . Asthma   . GERD (gastroesophageal reflux disease)   . Gout   . Hypertension   . Kidney stone   . OCD (obsessive compulsive disorder)   . Renal colic     Patient Active Problem List   Diagnosis Date Noted  . Alcohol withdrawal ([place]) 03/04/2020  . Right-sided chest wall pain 02/28/2020  . Alcohol abuse with alcohol-induced mood disorder ([place]) 12/02/2019  . Moderate episode of recurrent major depressive disorder ([place])   . Health care maintenance 10/26/2019  . Right knee pain 10/26/2019  . Generalized anxiety disorder 10/14/2019  . MDD (major depressive disorder), recurrent severe, without psychosis ([place]) 07/27/2019  . Social anxiety disorder 07/27/2019  . Left-sided chest wall pain 06/14/2019  . Alcoholic cirrhosis of liver without ascites ([place]) 06/14/2019  . Alcohol abuse 06/14/2019  . AKI (acute kidney injury) ([place]) 05/27/2019  . Alcohol withdrawal syndrome without complication ([place]) [id number]  . Alcoholic intoxication without complication ([place])   . Sepsis ([place]) 03/08/2019  . Breast lump or mass 10/04/2018  . Sepsis secondary to UTI ([place])  09/14/2018  . Asthma 07/07/2018  . GERD (gastroesophageal reflux disease) 07/07/2018  . OCD (obsessive compulsive disorder) 07/07/2018  . Self-inflicted laceration of wrist ([place]) 03/10/2018  . Leg hematoma 12/25/2017  . Suicide and self-inflicted injury by cutting and piercing instrument ([place]) 03/10/2017  . Severe recurrent major 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 ([place]) 03/09/2017  . Substance induced mood disorder ([place]) 08/15/2016  . Involuntary commitment 08/15/2016  . Alcohol use disorder, severe, dependence ([place]) 02/05/2016  . Hypertension 12/05/2015  . Tachycardia 12/05/2015  . Gout 11/13/2015  . Chronic back pain 05/02/2015    Past Surgical History:  Procedure Laterality Date  . CHOLECYSTECTOMY  2012  . EXTRACORPOREAL SHOCK WAVE LITHOTRIPSY Left 01/12/2019   Procedure: EXTRACORPOREAL SHOCK WAVE LITHOTRIPSY (ESWL);  Surgeon: Billey Co, MD;  Location: [facility] ORS;  Service: Urology;  Laterality: Left;    Prior to Admission medications   Medication Sig Start Date End Date Taking? Authorizing Provider  albuterol (VENTOLIN HFA) 108 (90 Base) MCG/ACT inhaler Inhale 2 puffs into the lungs every 6 (six) hours as needed.    [provider]  allopurinol (ZYLOPRIM) 300 MG tablet Take 1 tablet (300 mg total) by mouth daily. 02/28/20   Iloabachie, Chioma E, NP  ascorbic acid (VITAMIN C) 500 MG tablet Take 1 tablet (500 mg total) by mouth daily. 08/01/19   Clapacs, Madie Reno, MD  citalopram (CELEXA) 20 MG tablet Take 1 tablet (20 mg total) by mouth daily. 12/21/19   Sable Feil, PA-C  citalopram (CELEXA) 20 MG tablet  Take 1 tablet (20 mg total) by mouth daily. 12/21/19 12/20/20  Sable Feil, PA-C  cloNIDine (CATAPRES) 0.1 MG tablet Take 1 tablet (0.1 mg total) by mouth daily. 12/20/19   Iloabachie, Chioma E, NP  colchicine 0.6 MG tablet Take 1 tablet (0.6 mg total) by mouth daily as needed. 08/01/19   Clapacs, Madie Reno, MD  Fluticasone-Salmeterol (ADVAIR DISKUS) 100-50 MCG/DOSE AEPB  Inhale 1 puff into the lungs 2 (two) times daily. 09/01/18   Tukov-Yual, Arlyss Gandy, NP  folic acid (FOLVITE) 1 MG tablet Take 1 tablet (1 mg total) by mouth daily. 01/08/20   Iloabachie, Chioma E, NP  hydrOXYzine (ATARAX/VISTARIL) 25 MG tablet Take 1 tablet (25 mg total) by mouth 3 (three) times daily as needed for anxiety. Patient not taking: Reported on 02/28/2020 08/01/19   Clapacs, Madie Reno, MD  lisinopril (ZESTRIL) 40 MG tablet Take 1 tablet (40 mg total) by mouth daily. 10/26/19   Iloabachie, Chioma E, NP  Multiple Vitamins-Iron (MULTIVITAMINS WITH IRON) TABS tablet Take 1 tablet by mouth daily. 08/01/19   Clapacs, Madie Reno, MD  pantoprazole (PROTONIX) 40 MG tablet Take 1 tablet (40 mg total) by mouth daily. 01/08/20   Iloabachie, Chioma E, NP  QUEtiapine (SEROQUEL) 50 MG tablet Take 1 tablet (50 mg total) by mouth at bedtime. 10/14/19   Patrecia Pour, NP  thiamine 250 MG tablet Take 1 tablet (250 mg total) by mouth daily. Patient not taking: Reported on 03/04/2020 08/01/19   Clapacs, Madie Reno, MD    Allergies Percocet [oxycodone-acetaminophen]  Family History  Problem Relation Age of Onset  . Alcohol abuse Father   . Breast cancer Mother 22    Social History Social History   Tobacco Use  . Smoking status: Former Smoker    Types: Cigarettes  . Smokeless tobacco: Never Used  . Tobacco comment: quit 30 years ago  Substance Use Topics  . Alcohol use: Yes    Alcohol/week: 12.0 standard drinks    Types: 12 Shots of liquor per week    Comment: pint daily  . Drug use: No    Review of Systems Level 5 caveat: Unable to obtain review of systems due to alcohol intoxication   ____________________________________________   PHYSICAL EXAM:  VITAL SIGNS: ED Triage Vitals  Enc Vitals Group     BP 03/11/20 1721 (!) 180/105     Pulse Rate 03/11/20 1721 100     Resp 03/11/20 1721 (!) 22     Temp 03/11/20 1724 (!) 96.3 F (35.7 C)     Temp Source 03/11/20 1721 Oral     SpO2 03/11/20  1721 98 %     Weight 03/11/20 1720 180 lb (81.6 kg)     Height 03/11/20 1720 6' (1.829 m)     Head Circumference --      Peak Flow --      Pain Score 03/11/20 1720 0     Pain Loc --      Pain Edu? --      Excl. in [place]? --     Constitutional: Alert, intoxicated appearing. Eyes: Conjunctivae are normal.  EOMI. Head: Atraumatic. Nose: No congestion/rhinnorhea. Mouth/Throat: Mucous membranes are moist.  No tongue fasciculation. Neck: Normal range of motion.  Cardiovascular: Normal rate, regular rhythm. Good peripheral circulation. Respiratory: Normal respiratory effort.  No retractions.  Gastrointestinal: No distention.  Musculoskeletal: Extremities warm and well perfused.  Neurologic: Slurred speech.  Minimal tremor.  Motor intact in all extremities. Skin:  Skin is  warm and dry. No rash noted. Psychiatric: Anxious appearing, psychomotor agitation.  ____________________________________________   LABS (all labs ordered are listed, but only abnormal results are displayed)  Labs Reviewed  CBC WITH DIFFERENTIAL/PLATELET - Abnormal; Notable for the following components:      Result Value   RDW 17.0 (*)    All other components within normal limits  URINE DRUG SCREEN, QUALITATIVE ([facility] ONLY) - Abnormal; Notable for the following components:   Cannabinoid 50 Ng, Ur [place] POSITIVE (*)    All other components within normal limits  ETHANOL - Abnormal; Notable for the following components:   Alcohol, Ethyl (B) 336 (*)    All other components within normal limits  BASIC METABOLIC PANEL   ____________________________________________  EKG   ____________________________________________  RADIOLOGY    ____________________________________________   PROCEDURES  Procedure(s) performed: No  Procedures  Critical Care performed: No ____________________________________________   INITIAL IMPRESSION / ASSESSMENT AND PLAN / ED COURSE  Pertinent labs & imaging results that were  available during my care of the patient were reviewed by me and considered in my medical decision making (see chart for details).  59 year old male with PMH as noted above in the history of alcohol abuse presents with alcohol intoxication.  There was some history from EMS of a possible fall, however patient is unable to confirm this.  He cannot voice any specific complaints, and appears anxious and somewhat agitated.  He does not have any specific psychiatric symptoms although again cannot really give any coherent history.  I reviewed the past medical records in [place].  The patient was most recently here in the ED with anxiety and abdominal pain last week.  He previously has been diagnosed with alcohol induced mood disorder.  On exam, he is hypertensive with otherwise normal vital signs.  He has mild tremor but no tongue fasciculation.  There is no visible trauma.  Neurologic exam is nonfocal.  At this time, the patient does not have any active SI or HI and is relatively cooperative.  He does not demonstrate acute danger to self or others and does not require involuntary commitment at this time.  We will obtain labs, and plan to observe for sobriety.  I have placed the patient on a CIWA protocol.  Once he is more sober, we can reassess whether he requires further psychiatric evaluation or wants screening for detox.  ----------------------------------------- 11:18 PM on 03/11/2020 -----------------------------------------  The patient has had some intermittent agitation, trying to get out of the than taking his clothes off.  He required additional Ativan.  He is resting comfortably at this time.  Plan will be to observe for sobriety and determine if the patient has additional needs.  I signed him out to the oncoming physician Dr. Alfred Levins.  __________________________  The patient has been placed in psychiatric observation due to the need to provide a safe environment for the patient while obtaining  psychiatric consultation and evaluation, as well as ongoing medical and medication management to treat the patient's condition.  The patient has not been placed under IVC at this time.  ____________________________________________   FINAL CLINICAL IMPRESSION(S) / ED DIAGNOSES  Final diagnoses:  Alcohol abuse      NEW MEDICATIONS STARTED DURING THIS VISIT:  New Prescriptions   No medications on file     Note:  This document was prepared using Dragon voice recognition software and may include unintentional dictation errors.    Arta Silence, MD 03/11/20 2318

## 2020-03-11 NOTE — ED Triage Notes (Signed)
Pt states he drank a bottle of pinot grigio today. Admits to drinking 1 whole bottle. Pt fidgety. When asked if he fell he states "maybe yesterday." pt c/o of intermittent lower abd pain and gout but states "not right now" to pain. Pt won't keep temperature probe in mouth. Denies SI or HI.

## 2020-03-11 NOTE — ED Notes (Signed)
Pt continually takes off clothes and attempts to get out of bed. Pt unable to sit up on own without losing balance and rolling back down into bed on own. Pt states he hears family members calling his name in the halls and is trying to get to them. MD notified of situation. Orders to follow.

## 2020-03-11 NOTE — ED Notes (Signed)
PT  VOL °

## 2020-03-12 MED ORDER — CHLORDIAZEPOXIDE HCL 25 MG PO CAPS
50.0000 mg | ORAL_CAPSULE | Freq: Once | ORAL | Status: AC
  Administered 2020-03-12: 50 mg via ORAL
  Filled 2020-03-12: qty 2

## 2020-03-12 MED ORDER — CHLORDIAZEPOXIDE HCL 25 MG PO CAPS: ORAL_CAPSULE | ORAL | 0 refills | Status: DC

## 2020-03-12 NOTE — ED Notes (Signed)
Pt is currently laying in bed with blanket over him at this time

## 2020-03-12 NOTE — ED Notes (Signed)
Pt discharged home.  VS stable.  All belongings returned to patient.  Pt denies SI.  Discharge instructions and prescription reviewed with patient.

## 2020-03-12 NOTE — ED Notes (Signed)
Pt  Again with penis out and pants off in hallway, attempting to get out of bed. Easily redirected and returned to laying position

## 2020-03-12 NOTE — ED Provider Notes (Addendum)
Patient is clinically sober.  Alert and oriented, and acting appropriately. Minimal shakiness of his hands. He does admit to drinking alcohol last night.  States he was drinking wine and watching Nascar.  He does state that he would like assistance with alcohol detox, he does not like how much he drinks, he does not like that he feels shaky when he does not drink.  He denies any SI or HI or hallucinations.  Will provide Rx for Librium taper.  Discussed strict adherence to the taper.  Also discussed should he resume drinking, he needs to immediately stop the Librium taper due to potential for oversedation and respiratory compromise.  He voices clear understanding of this.  We will also provide referral to [facility] for further assistance.  He is otherwise medically stable for discharge.     Lilia Pro., MD 03/12/20 1225

## 2020-03-12 NOTE — Discharge Instructions (Addendum)
You have been seen in the Emergency Department (ED) today for alcohol intoxication. We will send you home with a prescription to help manage your symptoms while you detox. As we discussed if you resume drinking it is extremely important that you stop taking the medication, as the combination can make you too drowsy and compromise your breathing.   Please return to the ED immediately if you have ANY thoughts of hurting yourself or anyone else, so that we may help you.  Follow up with your doctor and/or therapist as soon as possible regarding today's ED visit.     Please contact [facility] for additional  assistance:  [address] [place], [place] [address] Phone:  [phone] or [phone]  Open Access:   Walk-in ASSESSMENT hours, M-W-F, 8:00am - 3:00pm Advanced Acess CRISIS:  M-F, 8:00am - 8:00pm Outpatient Services Office Hours:  M-F, 8:00am - 5:00pm

## 2020-03-12 NOTE — ED Notes (Signed)
Pt continues to not keep clothes on and continues to attempt to get out of bed. Unstable and unable to sit up on own. Pt continues to pull out penis and hold it. Will not answer questions with appropriate responses

## 2020-03-13 ENCOUNTER — Emergency Department
Admission: EM | Admit: 2020-03-13 | Discharge: 2020-03-14 | Disposition: A | Discharge status: Home / Self Care | Payer: Medicaid Other | Attending: Student in an Organized Health Care Education/Training Program | Admitting: Student in an Organized Health Care Education/Training Program

## 2020-03-13 ENCOUNTER — Other Ambulatory Visit: Payer: Self-pay

## 2020-03-13 ENCOUNTER — Encounter: Payer: Self-pay | Admitting: *Deleted

## 2020-03-13 DIAGNOSIS — F429 Obsessive-compulsive disorder, unspecified: Secondary | ICD-10-CM | POA: Diagnosis present

## 2020-03-13 DIAGNOSIS — Z87891 Personal history of nicotine dependence: Secondary | ICD-10-CM | POA: Insufficient documentation

## 2020-03-13 DIAGNOSIS — F10239 Alcohol dependence with withdrawal, unspecified: Secondary | ICD-10-CM | POA: Diagnosis present

## 2020-03-13 DIAGNOSIS — R4588 Nonsuicidal self-harm: Secondary | ICD-10-CM | POA: Diagnosis present

## 2020-03-13 DIAGNOSIS — Y92009 Unspecified place in unspecified non-institutional (private) residence as the place of occurrence of the external cause: Secondary | ICD-10-CM | POA: Diagnosis present

## 2020-03-13 DIAGNOSIS — F419 Anxiety disorder, unspecified: Secondary | ICD-10-CM | POA: Insufficient documentation

## 2020-03-13 DIAGNOSIS — F1020 Alcohol dependence, uncomplicated: Secondary | ICD-10-CM | POA: Diagnosis present

## 2020-03-13 DIAGNOSIS — F411 Generalized anxiety disorder: Secondary | ICD-10-CM | POA: Diagnosis present

## 2020-03-13 DIAGNOSIS — F10939 Alcohol use, unspecified with withdrawal, unspecified: Secondary | ICD-10-CM | POA: Diagnosis present

## 2020-03-13 DIAGNOSIS — F1014 Alcohol abuse with alcohol-induced mood disorder: Secondary | ICD-10-CM | POA: Diagnosis present

## 2020-03-13 DIAGNOSIS — Z20822 Contact with and (suspected) exposure to covid-19: Secondary | ICD-10-CM | POA: Insufficient documentation

## 2020-03-13 DIAGNOSIS — F10220 Alcohol dependence with intoxication, uncomplicated: Secondary | ICD-10-CM | POA: Diagnosis present

## 2020-03-13 DIAGNOSIS — Z79899 Other long term (current) drug therapy: Secondary | ICD-10-CM | POA: Insufficient documentation

## 2020-03-13 DIAGNOSIS — F332 Major depressive disorder, recurrent severe without psychotic features: Secondary | ICD-10-CM | POA: Diagnosis present

## 2020-03-13 DIAGNOSIS — F10230 Alcohol dependence with withdrawal, uncomplicated: Secondary | ICD-10-CM | POA: Diagnosis present

## 2020-03-13 DIAGNOSIS — F10930 Alcohol use, unspecified with withdrawal, uncomplicated: Secondary | ICD-10-CM | POA: Diagnosis present

## 2020-03-13 DIAGNOSIS — F1010 Alcohol abuse, uncomplicated: Secondary | ICD-10-CM | POA: Diagnosis present

## 2020-03-13 DIAGNOSIS — I10 Essential (primary) hypertension: Secondary | ICD-10-CM | POA: Insufficient documentation

## 2020-03-13 DIAGNOSIS — F10920 Alcohol use, unspecified with intoxication, uncomplicated: Secondary | ICD-10-CM | POA: Diagnosis present

## 2020-03-13 DIAGNOSIS — F1994 Other psychoactive substance use, unspecified with psychoactive substance-induced mood disorder: Secondary | ICD-10-CM | POA: Diagnosis present

## 2020-03-13 DIAGNOSIS — X789XXA Intentional self-harm by unspecified sharp object, initial encounter: Secondary | ICD-10-CM | POA: Diagnosis present

## 2020-03-13 DIAGNOSIS — K7030 Alcoholic cirrhosis of liver without ascites: Secondary | ICD-10-CM | POA: Diagnosis present

## 2020-03-13 LAB — URINE DRUG SCREEN, QUALITATIVE (ARMC ONLY)
Amphetamines, Ur Screen: NOT DETECTED
Barbiturates, Ur Screen: NOT DETECTED
Benzodiazepine, Ur Scrn: POSITIVE — AB
Cannabinoid 50 Ng, Ur ~~LOC~~: POSITIVE — AB
Cocaine Metabolite,Ur ~~LOC~~: NOT DETECTED
MDMA (Ecstasy)Ur Screen: NOT DETECTED
Methadone Scn, Ur: NOT DETECTED
Opiate, Ur Screen: NOT DETECTED
Phencyclidine (PCP) Ur S: NOT DETECTED
Tricyclic, Ur Screen: POSITIVE — AB

## 2020-03-13 LAB — COMPREHENSIVE METABOLIC PANEL
ALT: 32 U/L (ref 0–44)
AST: 30 U/L (ref 15–41)
Albumin: 4.5 g/dL (ref 3.5–5.0)
Alkaline Phosphatase: 88 U/L (ref 38–126)
Anion gap: 14 (ref 5–15)
BUN: 20 mg/dL (ref 6–20)
CO2: 25 mmol/L (ref 22–32)
Calcium: 8.2 mg/dL — ABNORMAL LOW (ref 8.9–10.3)
Chloride: 101 mmol/L (ref 98–111)
Creatinine, Ser: 1.35 mg/dL — ABNORMAL HIGH (ref 0.61–1.24)
GFR calc Af Amer: >60 mL/min (ref >60)
GFR calc non Af Amer: 57 mL/min — ABNORMAL LOW (ref >60)
Glucose, Bld: 95 mg/dL (ref 70–99)
Potassium: 3.1 mmol/L — ABNORMAL LOW (ref 3.5–5.1)
Sodium: 140 mmol/L (ref 135–145)
Total Bilirubin: 1.2 mg/dL (ref 0.3–1.2)
Total Protein: 7.8 g/dL (ref 6.5–8.1)

## 2020-03-13 LAB — CBC
HCT: 42.8 % (ref 39.0–52.0)
Hemoglobin: 14.3 g/dL (ref 13.0–17.0)
MCH: 29.9 pg (ref 26.0–34.0)
MCHC: 33.4 g/dL (ref 30.0–36.0)
MCV: 89.4 fL (ref 80.0–100.0)
Platelets: 213 10*3/uL (ref 150–400)
RBC: 4.79 MIL/uL (ref 4.22–5.81)
RDW: 16.9 % — ABNORMAL HIGH (ref 11.5–15.5)
WBC: 7.1 10*3/uL (ref 4.0–10.5)
nRBC: 0 % (ref 0.0–0.2)

## 2020-03-13 LAB — RESPIRATORY PANEL BY RT PCR (FLU A&B, COVID)
Influenza A by PCR: NEGATIVE
Influenza B by PCR: NEGATIVE
SARS Coronavirus 2 by RT PCR: NEGATIVE

## 2020-03-13 LAB — SALICYLATE LEVEL: Salicylate Lvl: <7 mg/dL — ABNORMAL LOW (ref 7.0–30.0)

## 2020-03-13 LAB — ACETAMINOPHEN LEVEL: Acetaminophen (Tylenol), Serum: <10 ug/mL — ABNORMAL LOW (ref 10–30)

## 2020-03-13 LAB — ETHANOL: Alcohol, Ethyl (B): 124 mg/dL — ABNORMAL HIGH (ref <10)

## 2020-03-13 IMAGING — CR CHEST - 2 VIEW
2 series · 2 of 2 positions shown · non-contrast
Comparison: 06/03/2019, 03/09/2019

CLINICAL DATA: Left-sided chest pain after fall

EXAM:
CHEST - 2 VIEW

[chest lat]
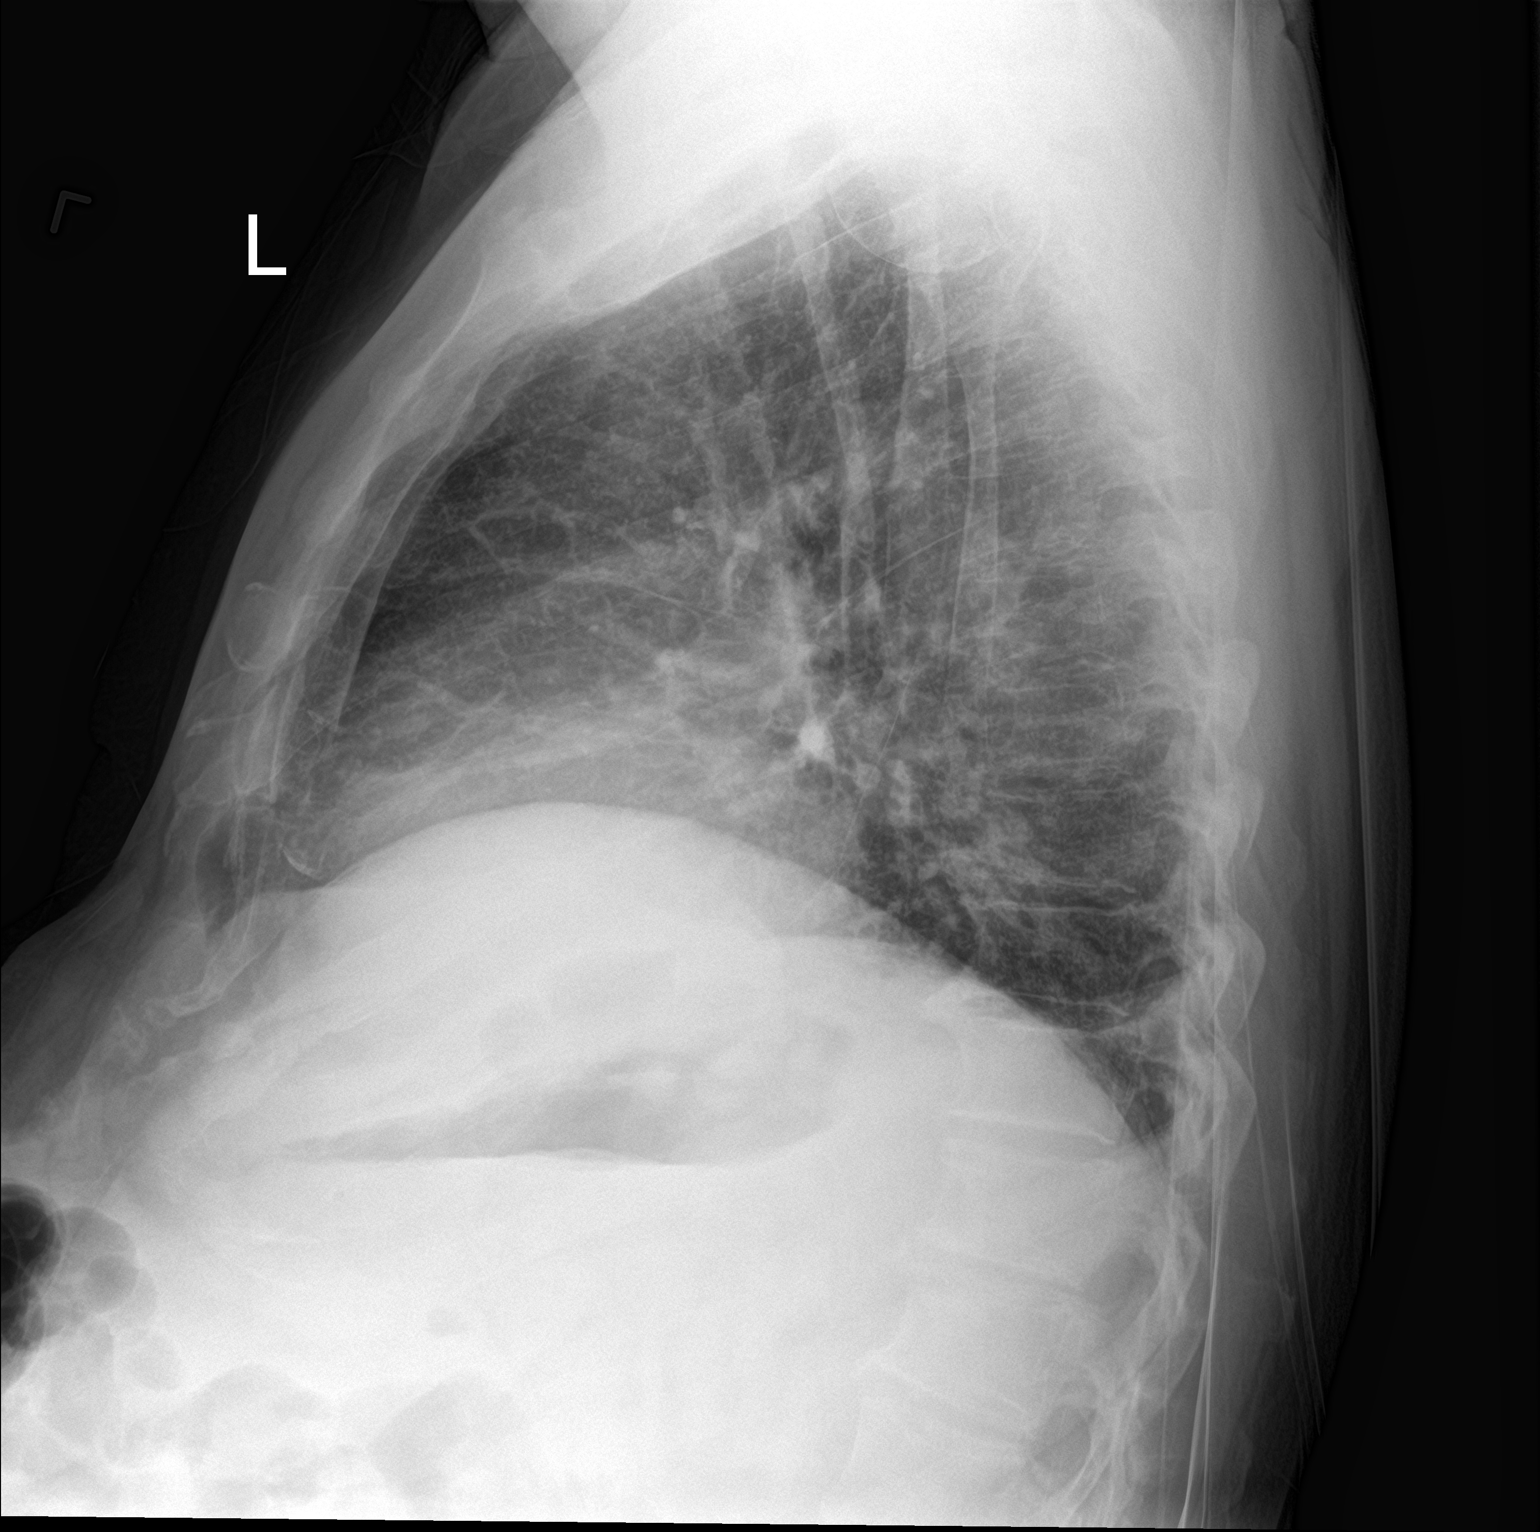

[chest ap]
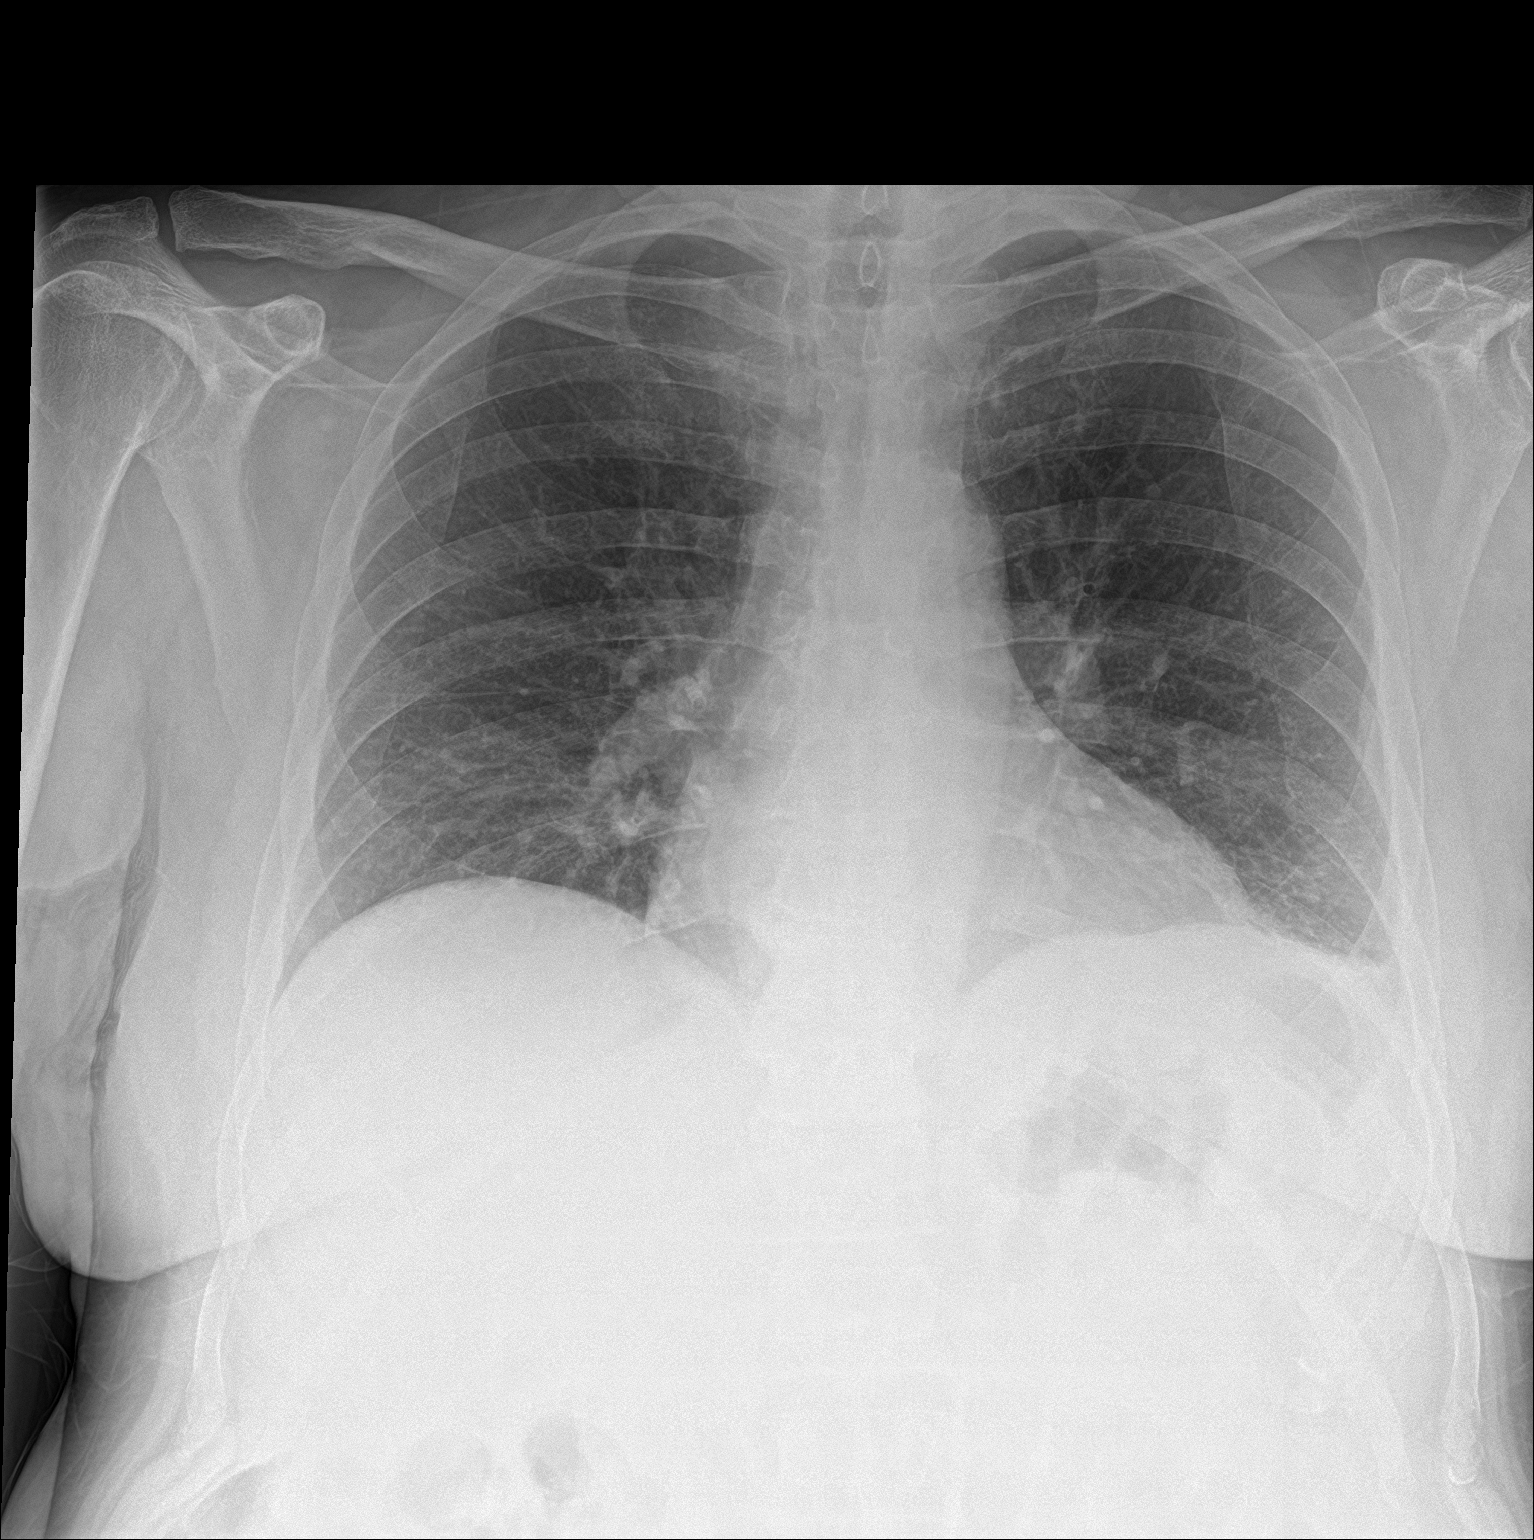

[2 of 2 positions shown; findings below may reference images not displayed]

FINDINGS: Minimal atelectasis at the left base. No consolidation, pleural
effusion or pneumothorax. Stable cardiomediastinal silhouette.
IMPRESSION: No active cardiopulmonary disease. Minimal atelectasis at the left
base

## 2020-03-13 MED ORDER — LORAZEPAM 2 MG PO TABS
0.0000 mg | ORAL_TABLET | Freq: Four times a day (QID) | ORAL | Status: DC
  Administered 2020-03-13 – 2020-03-14 (×3): 1 mg via ORAL
  Filled 2020-03-13 (×3): qty 1

## 2020-03-13 MED ORDER — LORAZEPAM 2 MG/ML IJ SOLN: 0.0000 mg | Freq: Two times a day (BID) | INTRAMUSCULAR | Status: DC

## 2020-03-13 MED ORDER — LORAZEPAM 2 MG PO TABS: 0.0000 mg | ORAL_TABLET | Freq: Two times a day (BID) | ORAL | Status: DC

## 2020-03-13 MED ORDER — THIAMINE HCL 100 MG/ML IJ SOLN: 100.0000 mg | Freq: Every day | INTRAMUSCULAR | Status: DC

## 2020-03-13 MED ORDER — THIAMINE HCL 100 MG PO TABS
100.0000 mg | ORAL_TABLET | Freq: Every day | ORAL | Status: DC
  Administered 2020-03-13 – 2020-03-14 (×2): 100 mg via ORAL
  Filled 2020-03-13 (×2): qty 1

## 2020-03-13 MED ORDER — CHLORDIAZEPOXIDE HCL 25 MG PO CAPS
50.0000 mg | ORAL_CAPSULE | Freq: Three times a day (TID) | ORAL | Status: DC
  Administered 2020-03-13 – 2020-03-14 (×3): 50 mg via ORAL
  Filled 2020-03-13 (×3): qty 2

## 2020-03-13 MED ORDER — LORAZEPAM 2 MG/ML IJ SOLN: 0.0000 mg | Freq: Four times a day (QID) | INTRAMUSCULAR | Status: DC

## 2020-03-13 NOTE — ED Triage Notes (Signed)
Pt brought in under IVC by [facility], was taken to [facility] by his wife for his alcohol use. Pt reports last drink was about 0200 this morning, drinks about "1/2 pint a day." Denies SI or HI. Pt does report hx of cutting himself "in the past year".  The patient says that "the only time I ever think about hurting myself is when I drink alcohol"

## 2020-03-13 NOTE — ED Notes (Signed)
Pt belongings removed (Gray shoes, black socks, pair of jeans, blue shirt, blue hat, blue pair of boxer shorts, brown belt, black wallet, cell phone, car keys) and placed in belongings bag, pt placed in scrubs.

## 2020-03-13 NOTE — Consult Note (Signed)
[facility] Face-to-Face Psychiatry Consult   Reason for Consult:Medical Clearance  Referring Physician: Dr. Quentin Cornwall Patient Identification: Miguel [name] MRN:  [id number] Principal Diagnosis: <principal problem not specified> Diagnosis:  Active Problems:   Alcohol use disorder, severe, dependence ([place])   Substance induced mood disorder ([place])   Severe recurrent major 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 ([place])   Suicide and self-inflicted injury by cutting and piercing instrument ([place])   OCD (obsessive compulsive disorder)   Alcoholic intoxication without complication ([place])   Alcohol withdrawal syndrome without complication ([place])   Alcoholic cirrhosis of liver without ascites ([place])   Alcohol abuse   MDD (major depressive disorder), recurrent severe, without psychosis ([place])   Generalized anxiety disorder   Alcohol abuse with alcohol-induced mood disorder ([place])   Alcohol withdrawal ([place])   Total Time spent with patient: 30 minutes  Subjective: " I feel like I was tricked coming here today. Miguel [name] is a 59 y.o. male patient presented to [facility] ED via law enforcement under Involuntary Commitment Status (IVC) by way of [facility]. " I was told to go to [place] to see what I could do to stop drinking." The patient is upset that he was left at [facility] by his wife's caregiver. The patient discussed that he called his wife, and she said he would not be coming home. He is upset that nobody talked to him. He is upset that they all spoke in the other room, and he was not part of his treatment meeting. He voiced, "I had pills to help me stop drinking." "Before I had a chance to do anything, I'm here.  I had no idea that I was staying". The patient voiced that he has not been drinking today.  He denied symptoms of depression.  He reports symptoms of anxiety.  The patient was seen face-to-face by this provider; chart reviewed and consulted with Dr. Quentin Cornwall on 03/13/2020 due to the patient's care. It was discussed with the EDP  that the patient does not meet the criteria to be admitted to the psychiatric inpatient unit. He will remain under observation overnight and be discharged in the a.m. once his IVC has been rescinded.  The patient is alert and oriented x3, calm, cooperative, regretful about his behavior on evaluation. The patient does not appear to be responding to internal or external stimuli. He is not presenting with any delusional thinking.  The patient denies auditory or visual hallucinations. The patient denies any suicidal, homicidal, or self-harm ideations. The patient is not presenting with any psychotic or paranoid behaviors. During an encounter with the patient, he was able to answer most questions appropriately.   Plan: The patient will remain under observation overnight and he can be discharged in the a.m. once his IVC is rescinded  HPI: Per Dr. Quentin Cornwall: Miguel [name] is a 58 y.o. male with the below listed past medical history well-known to this facility who presents from outpatient therapy facility [facility] under IVC.  It sounds like the primary concern is his alcoholism which is a chronic problem.  He denies any SI or HI at this time.Denies any pain  Past Psychiatric History:  Alcohol abuse Anxiety OCD (obsessive compulsive disorder)  Risk to Self: Suicidal Ideation: No Suicidal Intent: No Is patient at risk for suicide?: No Suicidal Plan?: No Access to Means: No What has been your use of drugs/alcohol within the last 12 months?: Daily use of alcohol How many times?: 0 Other Self Harm Risks: history of cutting Triggers for Past Attempts:  None known Intentional Self Injurious Behavior: Cutting Risk to Others: Homicidal Ideation: No Thoughts of Harm to Others: No Current Homicidal Intent: No Current Homicidal Plan: No Access to Homicidal Means: No Identified Victim: None identified History of harm to others?: No Assessment of Violence: None Noted Does patient have access to weapons?:  No Criminal Charges Pending?: No Does patient have a court date: No Prior Inpatient Therapy: Prior Inpatient Therapy: Yes Prior Therapy Dates: 2020  Prior Therapy Facilty/Provider(s): [facility] Reason for Treatment: Depression, Anxiety, Alcohol Abuse Prior Outpatient Therapy: Prior Outpatient Therapy: No Does patient have an ACCT team?: No Does patient have Intensive In-House Services?  : No Does patient have [facility] services? : No Does patient have P4CC services?: No  Past Medical History:  Past Medical History:  Diagnosis Date  . Alcohol abuse   . Anxiety   . Asthma   . GERD (gastroesophageal reflux disease)   . Gout   . Hypertension   . Kidney stone   . OCD (obsessive compulsive disorder)   . Renal colic     Past Surgical History:  Procedure Laterality Date  . CHOLECYSTECTOMY  2012  . EXTRACORPOREAL SHOCK WAVE LITHOTRIPSY Left 01/12/2019   Procedure: EXTRACORPOREAL SHOCK WAVE LITHOTRIPSY (ESWL);  Surgeon: Billey Co, MD;  Location: [facility] ORS;  Service: Urology;  Laterality: Left;   Family History:  Family History  Problem Relation Age of Onset  . Alcohol abuse Father   . Breast cancer Mother 12   Family Psychiatric  History:  Social History:  Social History   Substance and Sexual Activity  Alcohol Use Yes  . Alcohol/week: 12.0 standard drinks  . Types: 12 Shots of liquor per week   Comment: pint daily     Social History   Substance and Sexual Activity  Drug Use No    Social History   Socioeconomic History  . Marital status: Married    Spouse name: Not on file  . Number of children: Not on file  . Years of education: Not on file  . Highest education level: Not on file  Occupational History  . Not on file  Tobacco Use  . Smoking status: Former Smoker    Types: Cigarettes  . Smokeless tobacco: Never Used  . Tobacco comment: quit 30 years ago  Substance and Sexual Activity  . Alcohol use: Yes    Alcohol/week: 12.0 standard drinks    Types: 12 Shots  of liquor per week    Comment: pint daily  . Drug use: No  . Sexual activity: Not on file  Other Topics Concern  . Not on file  Social History Narrative   Lives at home with his wife and takes care of his wife. Independent at baseline      - Biggest strain is financial but doesn't think they could got get more help.      Patient expressed interest in possible financial assistance. Informed written consent obtained   Social Determinants of Health   Financial Resource Strain:   . Difficulty of Paying Living Expenses:   Food Insecurity:   . Worried About Charity fundraiser in the Last Year:   . Arboriculturist in the Last Year:   Transportation Needs:   . Film/video editor (Medical):   Marland Kitchen Lack of Transportation (Non-Medical):   Physical Activity:   . Days of Exercise per Week:   . Minutes of Exercise per Session:   Stress:   . Feeling of Stress :  Social Connections:   . Frequency of Communication with Friends and Family:   . Frequency of Social Gatherings with Friends and Family:   . Attends Religious Services:   . Active Member of Clubs or Organizations:   . Attends Archivist Meetings:   Marland Kitchen Marital Status:    Additional Social History:    Allergies:   Allergies  Allergen Reactions  . Percocet [Oxycodone-Acetaminophen] Nausea And Vomiting    Labs:  Results for orders placed or performed during the hospital encounter of 03/13/20 (from the past 48 hour(s))  Comprehensive metabolic panel     Status: Abnormal   Collection Time: 03/13/20  7:40 PM  Result Value Ref Range   Sodium 140 135 - 145 mmol/L   Potassium 3.1 (L) 3.5 - 5.1 mmol/L   Chloride 101 98 - 111 mmol/L   CO2 25 22 - 32 mmol/L   Glucose, Bld 95 70 - 99 mg/dL    Comment: Glucose reference range applies only to samples taken after fasting for at least 8 hours.   BUN 20 6 - 20 mg/dL   Creatinine, Ser 1.35 (H) 0.61 - 1.24 mg/dL   Calcium 8.2 (L) 8.9 - 10.3 mg/dL   Total Protein 7.8 6.5 -  8.1 g/dL   Albumin 4.5 3.5 - 5.0 g/dL   AST 30 15 - 41 U/L   ALT 32 0 - 44 U/L   Alkaline Phosphatase 88 38 - 126 U/L   Total Bilirubin 1.2 0.3 - 1.2 mg/dL   GFR calc non Af Amer 57 (L) >60 mL/min   GFR calc Af Amer >60 >60 mL/min   Anion gap 14 5 - 15    Comment: Performed at [facility], [address], [place], [place] [address]  Ethanol     Status: Abnormal   Collection Time: 03/13/20  7:40 PM  Result Value Ref Range   Alcohol, Ethyl (B) 124 (H) <10 mg/dL    Comment: (NOTE) Lowest detectable limit for serum alcohol is 10 mg/dL. For medical purposes only. Performed at [facility], [address] [place], [place] [id number]   Salicylate level     Status: Abnormal   Collection Time: 03/13/20  7:40 PM  Result Value Ref Range   Salicylate Lvl [id number] (L) 7.0 - 30.0 mg/dL    Comment: Performed at [facility], [address] [place], [place] [address]  Acetaminophen level     Status: Abnormal   Collection Time: 03/13/20  7:40 PM  Result Value Ref Range   Acetaminophen (Tylenol), Serum <10 (L) 10 - 30 ug/mL    Comment: (NOTE) Therapeutic concentrations vary significantly. A range of 10-30 ug/mL  may be an effective concentration for many patients. However, some  are best treated at concentrations outside of this range. Acetaminophen concentrations >150 ug/mL at 4 hours after ingestion  and >50 ug/mL at 12 hours after ingestion are often associated with  toxic reactions. Performed at [facility], [address] [place], [place] [address]   cbc     Status: Abnormal   Collection Time: 03/13/20  7:40 PM  Result Value Ref Range   WBC 7.1 4.0 - 10.5 K/uL   RBC 4.79 4.22 - 5.81 MIL/uL   Hemoglobin 14.3 13.0 - 17.0 g/dL   HCT 42.8 39.0 - 52.0 %   MCV 89.4 80.0 - 100.0 fL   MCH 29.9 26.0 - 34.0 pg   MCHC 33.4 30.0 - 36.0 g/dL   RDW 16.9 (H)  11.5 - 15.5 %   Platelets 213 150 - 400 K/uL   nRBC 0.0 0.0 - 0.2 %    Comment: Performed at  [facility], [address] [place], [place] [address]  Urine Drug Screen, Qualitative     Status: Abnormal   Collection Time: 03/13/20  7:40 PM  Result Value Ref Range   Tricyclic, Ur Screen POSITIVE (A) NONE DETECTED   Amphetamines, Ur Screen NONE DETECTED NONE DETECTED   MDMA (Ecstasy)Ur Screen NONE DETECTED NONE DETECTED   Cocaine Metabolite,Ur [place] NONE DETECTED NONE DETECTED   Opiate, Ur Screen NONE DETECTED NONE DETECTED   Phencyclidine (PCP) Ur S NONE DETECTED NONE DETECTED   Cannabinoid 50 Ng, Ur [place] POSITIVE (A) NONE DETECTED   Barbiturates, Ur Screen NONE DETECTED NONE DETECTED   Benzodiazepine, Ur Scrn POSITIVE (A) NONE DETECTED   Methadone Scn, Ur NONE DETECTED NONE DETECTED    Comment: (NOTE) Tricyclics + metabolites, urine    Cutoff 1000 ng/mL Amphetamines + metabolites, urine  Cutoff 1000 ng/mL MDMA (Ecstasy), urine              Cutoff 500 ng/mL Cocaine Metabolite, urine          Cutoff 300 ng/mL Opiate + metabolites, urine        Cutoff 300 ng/mL Phencyclidine (PCP), urine         Cutoff 25 ng/mL Cannabinoid, urine                 Cutoff 50 ng/mL Barbiturates + metabolites, urine  Cutoff 200 ng/mL Benzodiazepine, urine              Cutoff 200 ng/mL Methadone, urine                   Cutoff 300 ng/mL The urine drug screen provides only a preliminary, unconfirmed analytical test result and should not be used for non-medical purposes. Clinical consideration and professional judgment should be applied to any positive drug screen result due to possible interfering substances. A more specific alternate chemical method must be used in order to obtain a confirmed analytical result. Gas chromatography / mass spectrometry (GC/MS) is the preferred confirmat ory method. Performed at [facility], [address], [place], [place] [address]   Respiratory Panel by RT PCR (Flu A&B, Covid) - Nasopharyngeal Swab     Status: None   Collection Time: 03/13/20  8:30  PM   Specimen: Nasopharyngeal Swab  Result Value Ref Range   SARS Coronavirus 2 by RT PCR NEGATIVE NEGATIVE    Comment: (NOTE) SARS-CoV-2 target nucleic acids are NOT DETECTED. The SARS-CoV-2 RNA is generally detectable in upper respiratoy specimens during the acute phase of infection. The lowest concentration of SARS-CoV-2 viral copies this assay can detect is 131 copies/mL. A negative result does not preclude SARS-Cov-2 infection and should not be used as the sole basis for treatment or other patient management decisions. A negative result may occur with  improper specimen collection/handling, submission of specimen other than nasopharyngeal swab, presence of viral mutation(s) within the areas targeted by this assay, and inadequate number of viral copies (<131 copies/mL). A negative result must be combined with clinical observations, patient history, and epidemiological information. The expected result is Negative. Fact Sheet for Patients:  PinkCheek.be Fact Sheet for Healthcare Providers:  GravelBags.it This test is not yet ap proved or cleared by the [place] FDA and  has been authorized for detection and/or diagnosis of SARS-CoV-2 by FDA under an  Emergency Use Authorization (EUA). This EUA will remain  in effect (meaning this test can be used) for the duration of the COVID-19 declaration under Section 564(b)(1) of the Act, 21 U.S.C. section 360bbb-3(b)(1), unless the authorization is terminated or revoked sooner.    Influenza A by PCR NEGATIVE NEGATIVE   Influenza B by PCR NEGATIVE NEGATIVE    Comment: (NOTE) The Xpert Xpress SARS-CoV-2/FLU/RSV assay is intended as an aid in  the diagnosis of influenza from Nasopharyngeal swab specimens and  should not be used as a sole basis for treatment. Nasal washings and  aspirates are unacceptable for Xpert Xpress SARS-CoV-2/FLU/RSV  testing. Fact Sheet for  Patients: PinkCheek.be Fact Sheet for Healthcare Providers: GravelBags.it This test is not yet approved or cleared by the [place] FDA and  has been authorized for detection and/or diagnosis of SARS-CoV-2 by  FDA under an Emergency Use Authorization (EUA). This EUA will remain  in effect (meaning this test can be used) for the duration of the  Covid-19 declaration under Section 564(b)(1) of the Act, 21  U.S.C. section 360bbb-3(b)(1), unless the authorization is  terminated or revoked. Performed at [facility], [address], [place], [place] [address]     Current Facility-Administered Medications  Medication Dose Route Frequency Provider Last Rate Last Admin  . chlordiazePOXIDE (LIBRIUM) capsule 50 mg  50 mg Oral TID Caroline Sauger, NP   50 mg at 03/13/20 2207  . LORazepam (ATIVAN) injection 0-4 mg  0-4 mg Intravenous Q6H Merlyn Lot, MD       Or  . LORazepam (ATIVAN) tablet 0-4 mg  0-4 mg Oral Q6H Merlyn Lot, MD   1 mg at 03/13/20 2054  . [START ON 03/16/2020] LORazepam (ATIVAN) injection 0-4 mg  0-4 mg Intravenous Q12H Merlyn Lot, MD       Or  . Derrill Memo ON 03/16/2020] LORazepam (ATIVAN) tablet 0-4 mg  0-4 mg Oral Q12H Merlyn Lot, MD      . thiamine tablet 100 mg  100 mg Oral Daily Merlyn Lot, MD   100 mg at 03/13/20 2054   Or  . thiamine (B-1) injection 100 mg  100 mg Intravenous Daily Merlyn Lot, MD       Current Outpatient Medications  Medication Sig Dispense Refill  . albuterol (VENTOLIN HFA) 108 (90 Base) MCG/ACT inhaler Inhale 2 puffs into the lungs every 6 (six) hours as needed.    Marland Kitchen allopurinol (ZYLOPRIM) 300 MG tablet Take 1 tablet (300 mg total) by mouth daily. 30 tablet 1  . ascorbic acid (VITAMIN C) 500 MG tablet Take 1 tablet (500 mg total) by mouth daily. 30 tablet 1  . chlordiazePOXIDE (LIBRIUM) 25 MG capsule Day 1: take 50 mg (2 capsules) in AM,  afternoon, and evening.  Day 2: take 50 mg (2 capsules) AM and PM.  Day 3: take 50 mg (2 capsules) in AM 12 capsule 0  . citalopram (CELEXA) 20 MG tablet Take 1 tablet (20 mg total) by mouth daily. 30 tablet 0  . cloNIDine (CATAPRES) 0.1 MG tablet Take 1 tablet (0.1 mg total) by mouth daily. 30 tablet 2  . colchicine 0.6 MG tablet Take 1 tablet (0.6 mg total) by mouth daily as needed. 30 tablet 1  . Fluticasone-Salmeterol (ADVAIR DISKUS) 100-50 MCG/DOSE AEPB Inhale 1 puff into the lungs 2 (two) times daily. [id number] each 0  . folic acid (FOLVITE) 1 MG tablet Take 1 tablet (1 mg total) by mouth daily. 60 tablet 0  . lisinopril (ZESTRIL)  40 MG tablet Take 1 tablet (40 mg total) by mouth daily. 30 tablet 1  . Multiple Vitamins-Iron (MULTIVITAMINS WITH IRON) TABS tablet Take 1 tablet by mouth daily. 30 tablet 1  . pantoprazole (PROTONIX) 40 MG tablet Take 1 tablet (40 mg total) by mouth daily. 90 tablet 0  . QUEtiapine (SEROQUEL) 50 MG tablet Take 1 tablet (50 mg total) by mouth at bedtime. 60 tablet 1    Musculoskeletal: Strength & Muscle Tone: within normal limits Gait & Station: normal Patient leans: N/A  Psychiatric Specialty Exam: Physical Exam  Nursing note and vitals reviewed. Constitutional: He is oriented to person, place, and time. He appears well-developed and well-nourished.  Cardiovascular: Normal rate.  Respiratory: Effort normal.  Musculoskeletal:        General: Normal range of motion.     Cervical back: Normal range of motion and neck supple.  Neurological: He is alert and oriented to person, place, and time.  Psychiatric: He has a normal mood and affect. His behavior is normal. Thought content normal.    Review of Systems  Skin: Positive for wound.  Psychiatric/Behavioral: The patient is nervous/anxious.   All other systems reviewed and are negative.   Blood pressure (!) 151/90, pulse (!) 101, temperature 98.6 F (37 C), temperature source Oral, resp. rate 16, SpO2 99  %.There is no height or weight on file to calculate BMI.  General Appearance: Casual and Disheveled  Eye Contact:  Good  Speech:  Clear and Coherent  Volume:  Normal  Mood:  Anxious and Depressed  Affect:  Appropriate and Congruent  Thought Process:  Coherent and Goal Directed  Orientation:  Full (Time, Place, and Person)  Thought Content:  WDL and Logical  Suicidal Thoughts:  No  Homicidal Thoughts:  No  Memory:  Immediate;   Good Recent;   Good Remote;   Good  Judgement:  Intact  Insight:  Fair  Psychomotor Activity:  Normal  Concentration:  Concentration: Good and Attention Span: Good  Recall:  Good  Fund of Knowledge:  Good  Language:  Good  Akathisia:  Negative  Handed:  Right  AIMS (if indicated):     Assets:  Communication Skills Desire for Improvement Physical Health Social Support  ADL's:  Intact  Cognition:  WNL  Sleep:    Okay     Treatment Plan Summary: Plan overnight observation then discharged in the am.  Disposition: No evidence of imminent risk to self or others at present.   Patient does not meet criteria for psychiatric inpatient admission. Supportive therapy provided about ongoing stressors. Discussed crisis plan, support from social network, calling 911, coming to the Emergency Department, and calling Suicide Hotline.  Caroline Sauger, NP 03/13/2020 10:52 PM

## 2020-03-13 NOTE — BH Assessment (Signed)
Assessment Note  Miguel [name] is an 59 y.o. male. Miguel [name] arrived to the ED by way of law enforcement under IVC."I was told to go to [facility] to see what I could do to stop drinking.  I did not know that I would get left there.  I called my wife, and she said I was not coming home. Nobody talked to me, they all talked in the other room.  I had pills to help me stop drinking. Before I had a chance to do anything, I'm here.  I had no idea that I was staying". He reports that he has not been drinking today.  He denied symptoms of depression.  He reports symptoms of anxiety.  He denied having auditory or visual hallucinations.  He denied suicidal or homicidal ideation or intent. He states that he is facing financial stressors and stressors from his wife's illness.   IVC reports, "Client alcohol use for years recognizes it impacted his life. Collateral information received from wife and her caretaker. His hands are trembling.  He is ambivalent on getting treatment. Client's wife and caretake think that he is a harm to himself. Client has attempted multiple times with [facility]."  Diagnosis: Alcohol Abuse  Past Medical History:  Past Medical History:  Diagnosis Date  . Alcohol abuse   . Anxiety   . Asthma   . GERD (gastroesophageal reflux disease)   . Gout   . Hypertension   . Kidney stone   . OCD (obsessive compulsive disorder)   . Renal colic     Past Surgical History:  Procedure Laterality Date  . CHOLECYSTECTOMY  2012  . EXTRACORPOREAL SHOCK WAVE LITHOTRIPSY Left 01/12/2019   Procedure: EXTRACORPOREAL SHOCK WAVE LITHOTRIPSY (ESWL);  Surgeon: Billey Co, MD;  Location: [facility] ORS;  Service: Urology;  Laterality: Left;    Family History:  Family History  Problem Relation Age of Onset  . Alcohol abuse Father   . Breast cancer Mother 64    Social History:  reports that he has quit smoking. His smoking use included cigarettes. He has never used smokeless tobacco. He reports current alcohol use  of about 12.0 standard drinks of alcohol per week. He reports that he does not use drugs.  Additional Social History:  Alcohol / Drug Use History of alcohol / drug use?: Yes Substance #1 Name of Substance 1: Alcohol 1 - Frequency: Daily 1 - Last Use / Amount: 03/13/2020  CIWA: CIWA-Ar BP: (!) 151/90 Pulse Rate: (!) 101 Nausea and Vomiting: no nausea and no vomiting Tactile Disturbances: none Tremor: moderate, with patient's arms extended Auditory Disturbances: not present Paroxysmal Sweats: no sweat visible Visual Disturbances: not present Anxiety: three Headache, Fullness in Head: none present Agitation: normal activity Orientation and Clouding of Sensorium: oriented and can do serial additions CIWA-Ar Total: 7 COWS:    Allergies:  Allergies  Allergen Reactions  . Percocet [Oxycodone-Acetaminophen] Nausea And Vomiting    Home Medications: (Not in a hospital admission)   OB/GYN Status:  No LMP for male patient.  General Assessment Data Location of Assessment: [facility] ED TTS Assessment: In system Is this a Tele or Face-to-Face Assessment?: Face-to-Face Is this an Initial Assessment or a Re-assessment for this encounter?: Initial Assessment Patient Accompanied by:: N/A Language Other than English: No Living Arrangements: Other (Comment)(Private residence) What gender do you identify as?: Male Marital status: Married Living Arrangements: Spouse/significant other Can pt return to current living arrangement?: Yes Admission Status: Involuntary Petitioner: Other([facility]) Is patient capable of  signing voluntary admission?: No Referral Source: Self/Family/Friend Insurance type: Medicaid  Medical Screening Exam ([place]) Medical Exam completed: Yes  Crisis Care Plan Living Arrangements: Spouse/significant other Legal Guardian: Other:(Self) Name of Psychiatrist: None Name of Therapist: None  Education Status Is patient currently in school?: No Is the patient  employed, unemployed or receiving disability?: Unemployed  Risk to self with the past 6 months Suicidal Ideation: No Has patient been a risk to self within the past 6 months prior to admission? : Yes Suicidal Intent: No Has patient had any suicidal intent within the past 6 months prior to admission? : No Is patient at risk for suicide?: No Suicidal Plan?: No Has patient had any suicidal plan within the past 6 months prior to admission? : No Access to Means: No What has been your use of drugs/alcohol within the last 12 months?: Daily use of alcohol Previous Attempts/Gestures: No How many times?: 0 Other Self Harm Risks: history of cutting Triggers for Past Attempts: None known Intentional Self Injurious Behavior: Cutting Family Suicide History: No Recent stressful life event(s): Other (Comment), Financial Problems(Health concerns of wife) Persecutory voices/beliefs?: No Depression: No Depression Symptoms: (Denied by patient) Substance abuse history and/or treatment for substance abuse?: Yes Suicide prevention information given to non-admitted patients: Not applicable  Risk to Others within the past 6 months Homicidal Ideation: No Does patient have any lifetime risk of violence toward others beyond the six months prior to admission? : No Thoughts of Harm to Others: No Current Homicidal Intent: No Current Homicidal Plan: No Access to Homicidal Means: No Identified Victim: None identified History of harm to others?: No Assessment of Violence: None Noted Does patient have access to weapons?: No Criminal Charges Pending?: No Does patient have a court date: No Is patient on probation?: No  Psychosis Hallucinations: None noted Delusions: None noted  Mental Status Report Appearance/Hygiene: In scrubs, Unremarkable Eye Contact: Fair Motor Activity: Unremarkable Speech: Logical/coherent, Tangential Level of Consciousness: Alert Mood: Euthymic Affect: Appropriate to  circumstance Anxiety Level: Minimal Thought Processes: Tangential Judgement: Partial Orientation: Appropriate for developmental age Obsessive Compulsive Thoughts/Behaviors: None  Cognitive Functioning Concentration: Poor Memory: Unable to Assess("I have been forgetting a lot of stuff") Is patient IDD: No Insight: Fair Impulse Control: Poor Appetite: Poor Have you had any weight changes? : No Change Sleep: Decreased Vegetative Symptoms: None  ADLScreening [facility] Assessment Services) Patient's cognitive ability adequate to safely complete daily activities?: Yes Patient able to express need for assistance with ADLs?: Yes Independently performs ADLs?: Yes (appropriate for developmental age)  Prior Inpatient Therapy Prior Inpatient Therapy: Yes Prior Therapy Dates: 2020  Prior Therapy Facilty/Provider(s): [facility] Reason for Treatment: Depression, Anxiety, Alcohol Abuse  Prior Outpatient Therapy Prior Outpatient Therapy: No Does patient have an ACCT team?: No Does patient have Intensive In-House Services?  : No Does patient have [facility] services? : No Does patient have P4CC services?: No  ADL Screening (condition at time of admission) Patient's cognitive ability adequate to safely complete daily activities?: Yes Is the patient deaf or have difficulty hearing?: No Does the patient have difficulty seeing, even when wearing glasses/contacts?: No Does the patient have difficulty concentrating, remembering, or making decisions?: No Patient able to express need for assistance with ADLs?: Yes Does the patient have difficulty dressing or bathing?: No Independently performs ADLs?: Yes (appropriate for developmental age) Does the patient have difficulty walking or climbing stairs?: No Weakness of Legs: None Weakness of Arms/Hands: None  Home Assistive Devices/Equipment Home Assistive Devices/Equipment: None  Advance Directives (For Healthcare) Does Patient Have a Medical  Advance Directive?: No Would patient like information on creating a medical advance directive?: No - Patient declined          Disposition:  Disposition Initial Assessment Completed for this Encounter: Yes  On Site Evaluation by:   Reviewed with Physician:    Elmer Bales 03/13/2020 9:27 PM

## 2020-03-13 NOTE — ED Provider Notes (Signed)
[facility] Emergency Department Provider Note    First MD Initiated Contact with Patient 03/13/20 1946     (approximate)  I have reviewed the triage vital signs and the nursing notes.   HISTORY  Chief Complaint Medical Clearance    HPI Miguel [name] is a 59 y.o. male with the below listed past medical history well-known to this facility who presents from outpatient therapy facility [facility] under IVC.  It sounds like the primary concern is his alcoholism which is a chronic problem.  He denies any SI or HI at this time.   Denies any pain.  Past Medical History:  Diagnosis Date  . Alcohol abuse   . Anxiety   . Asthma   . GERD (gastroesophageal reflux disease)   . Gout   . Hypertension   . Kidney stone   . OCD (obsessive compulsive disorder)   . Renal colic    Family History  Problem Relation Age of Onset  . Alcohol abuse Father   . Breast cancer Mother 19   Past Surgical History:  Procedure Laterality Date  . CHOLECYSTECTOMY  2012  . EXTRACORPOREAL SHOCK WAVE LITHOTRIPSY Left 01/12/2019   Procedure: EXTRACORPOREAL SHOCK WAVE LITHOTRIPSY (ESWL);  Surgeon: Billey Co, MD;  Location: [facility] ORS;  Service: Urology;  Laterality: Left;   Patient Active Problem List   Diagnosis Date Noted  . Alcohol withdrawal ([place]) 03/04/2020  . Right-sided chest wall pain 02/28/2020  . Alcohol abuse with alcohol-induced mood disorder ([place]) 12/02/2019  . Moderate episode of recurrent major depressive disorder ([place])   . Health care maintenance 10/26/2019  . Right knee pain 10/26/2019  . Generalized anxiety disorder 10/14/2019  . MDD (major depressive disorder), recurrent severe, without psychosis ([place]) 07/27/2019  . Social anxiety disorder 07/27/2019  . Left-sided chest wall pain 06/14/2019  . Alcoholic cirrhosis of liver without ascites ([place]) 06/14/2019  . Alcohol abuse 06/14/2019  . AKI (acute kidney injury) ([place]) 05/27/2019  . Alcohol withdrawal syndrome  without complication ([place]) [id number]  . Alcoholic intoxication without complication ([place])   . Sepsis ([place]) 03/08/2019  . Breast lump or mass 10/04/2018  . Sepsis secondary to UTI ([place]) 09/14/2018  . Asthma 07/07/2018  . GERD (gastroesophageal reflux disease) 07/07/2018  . OCD (obsessive compulsive disorder) 07/07/2018  . Self-inflicted laceration of wrist ([place]) 03/10/2018  . Leg hematoma 12/25/2017  . Suicide and self-inflicted injury by cutting and piercing instrument ([place]) 03/10/2017  . Severe recurrent major 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 ([place]) 03/09/2017  . Substance induced mood disorder ([place]) 08/15/2016  . Involuntary commitment 08/15/2016  . Alcohol use disorder, severe, dependence ([place]) 02/05/2016  . Hypertension 12/05/2015  . Tachycardia 12/05/2015  . Gout 11/13/2015  . Chronic back pain 05/02/2015      Prior to Admission medications   Medication Sig Start Date End Date Taking? Authorizing Provider  albuterol (VENTOLIN HFA) 108 (90 Base) MCG/ACT inhaler Inhale 2 puffs into the lungs every 6 (six) hours as needed.    [provider]  allopurinol (ZYLOPRIM) 300 MG tablet Take 1 tablet (300 mg total) by mouth daily. 02/28/20   Iloabachie, Chioma E, NP  ascorbic acid (VITAMIN C) 500 MG tablet Take 1 tablet (500 mg total) by mouth daily. 08/01/19   Clapacs, Madie Reno, MD  chlordiazePOXIDE (LIBRIUM) 25 MG capsule Day 1: take 50 mg (2 capsules) in AM, afternoon, and evening.  Day 2: take 50 mg (2 capsules) AM and PM.  Day 3: take 50 mg (2  capsules) in AM 03/12/20   Lilia Pro., MD  citalopram (CELEXA) 20 MG tablet Take 1 tablet (20 mg total) by mouth daily. 12/21/19 12/20/20  Sable Feil, PA-C  cloNIDine (CATAPRES) 0.1 MG tablet Take 1 tablet (0.1 mg total) by mouth daily. 12/20/19   Iloabachie, Chioma E, NP  colchicine 0.6 MG tablet Take 1 tablet (0.6 mg total) by mouth daily as needed. 08/01/19   Clapacs, Madie Reno, MD  Fluticasone-Salmeterol (ADVAIR DISKUS) 100-50  MCG/DOSE AEPB Inhale 1 puff into the lungs 2 (two) times daily. 09/01/18   Tukov-Yual, Arlyss Gandy, NP  folic acid (FOLVITE) 1 MG tablet Take 1 tablet (1 mg total) by mouth daily. 01/08/20   Iloabachie, Chioma E, NP  lisinopril (ZESTRIL) 40 MG tablet Take 1 tablet (40 mg total) by mouth daily. 10/26/19   Iloabachie, Chioma E, NP  Multiple Vitamins-Iron (MULTIVITAMINS WITH IRON) TABS tablet Take 1 tablet by mouth daily. 08/01/19   Clapacs, Madie Reno, MD  pantoprazole (PROTONIX) 40 MG tablet Take 1 tablet (40 mg total) by mouth daily. 01/08/20   Iloabachie, Chioma E, NP  QUEtiapine (SEROQUEL) 50 MG tablet Take 1 tablet (50 mg total) by mouth at bedtime. 10/14/19   Patrecia Pour, NP    Allergies Percocet [oxycodone-acetaminophen]    Social History Social History   Tobacco Use  . Smoking status: Former Smoker    Types: Cigarettes  . Smokeless tobacco: Never Used  . Tobacco comment: quit 30 years ago  Substance Use Topics  . Alcohol use: Yes    Alcohol/week: 12.0 standard drinks    Types: 12 Shots of liquor per week    Comment: pint daily  . Drug use: No    Review of Systems Patient denies headaches, rhinorrhea, blurry vision, numbness, shortness of breath, chest pain, edema, cough, abdominal pain, nausea, vomiting, diarrhea, dysuria, fevers, rashes or hallucinations unless otherwise stated above in HPI. ____________________________________________   PHYSICAL EXAM:  VITAL SIGNS: Vitals:   03/13/20 1930 03/13/20 2028  BP: (!) 155/90 (!) 151/90  Pulse: (!) 102 (!) 101  Resp: 16   Temp: 98.6 F (37 C)   SpO2: 99%     Constitutional: Alert and oriented.  Eyes: Conjunctivae are normal.  Head: Atraumatic. Nose: No congestion/rhinnorhea. Mouth/Throat: Mucous membranes are moist.   Neck: No stridor. Painless ROM.  Cardiovascular: Normal rate, regular rhythm. Grossly normal heart sounds.  Good peripheral circulation. Respiratory: Normal respiratory effort.  No retractions. Lungs  CTAB. Gastrointestinal: Soft and nontender. No distention. No abdominal bruits. No CVA tenderness. Genitourinary:  Musculoskeletal: No lower extremity tenderness nor edema.  No joint effusions. Neurologic:  Normal speech and language. No gross focal neurologic deficits are appreciated. No facial droop Skin:  Skin is warm, dry and intact. No rash noted. Psychiatric: calm,  Odd, cooperative  ____________________________________________   LABS (all labs ordered are listed, but only abnormal results are displayed)  Results for orders placed or performed during the hospital encounter of 03/13/20 (from the past 24 hour(s))  Comprehensive metabolic panel     Status: Abnormal   Collection Time: 03/13/20  7:40 PM  Result Value Ref Range   Sodium 140 135 - 145 mmol/L   Potassium 3.1 (L) 3.5 - 5.1 mmol/L   Chloride 101 98 - 111 mmol/L   CO2 25 22 - 32 mmol/L   Glucose, Bld 95 70 - 99 mg/dL   BUN 20 6 - 20 mg/dL   Creatinine, Ser 1.35 (H) 0.61 - 1.24 mg/dL  Calcium 8.2 (L) 8.9 - 10.3 mg/dL   Total Protein 7.8 6.5 - 8.1 g/dL   Albumin 4.5 3.5 - 5.0 g/dL   AST 30 15 - 41 U/L   ALT 32 0 - 44 U/L   Alkaline Phosphatase 88 38 - 126 U/L   Total Bilirubin 1.2 0.3 - 1.2 mg/dL   GFR calc non Af Amer 57 (L) >60 mL/min   GFR calc Af Amer >60 >60 mL/min   Anion gap 14 5 - 15  cbc     Status: Abnormal   Collection Time: 03/13/20  7:40 PM  Result Value Ref Range   WBC 7.1 4.0 - 10.5 K/uL   RBC 4.79 4.22 - 5.81 MIL/uL   Hemoglobin 14.3 13.0 - 17.0 g/dL   HCT 42.8 39.0 - 52.0 %   MCV 89.4 80.0 - 100.0 fL   MCH 29.9 26.0 - 34.0 pg   MCHC 33.4 30.0 - 36.0 g/dL   RDW 16.9 (H) 11.5 - 15.5 %   Platelets 213 150 - 400 K/uL   nRBC 0.0 0.0 - 0.2 %   ____________________________________________  ____________________________________________  RADIOLOGY   ____________________________________________   PROCEDURES  Procedure(s) performed:  Procedures    Critical Care performed:  no ____________________________________________   INITIAL IMPRESSION / ASSESSMENT AND PLAN / ED COURSE  Pertinent labs & imaging results that were available during my care of the patient were reviewed by me and considered in my medical decision making (see chart for details).   DDX: Psychosis, delirium, medication effect, noncompliance, polysubstance abuse, Si, Hi, depression   Miguel [name] is a 59 y.o. who presents to the ED with for evaluation of etoh abuse and under IVC after being evaluated at [facility].  Quite frankly he appears well and at his baseline.  He denies any SI or HI but given his history will maintain IVC as it sounds like the providers at [facility] were wanting him inpatient.  Will consult psych and TTS..   The patient has been placed in psychiatric observation due to the need to provide a safe environment for the patient while obtaining psychiatric consultation and evaluation, as well as ongoing medical and medication management to treat the patient's condition.  The patient has been placed under full IVC at this time.      The patient was evaluated in Emergency Department today for the symptoms described in the history of present illness. He/she was evaluated in the context of the global COVID-19 pandemic, which necessitated consideration that the patient might be at risk for infection with the SARS-CoV-2 virus that causes COVID-19. Institutional protocols and algorithms that pertain to the evaluation of patients at risk for COVID-19 are in a state of rapid change based on information released by regulatory bodies including the CDC and federal and state organizations. These policies and algorithms were followed during the patient's care in the ED.  As part of my medical decision making, I reviewed the following data within the [place] notes reviewed and incorporated, Labs reviewed, notes from prior ED visits and [place] Controlled Substance  Database   ____________________________________________   FINAL CLINICAL IMPRESSION(S) / ED DIAGNOSES  Final diagnoses:  Alcohol abuse      NEW MEDICATIONS STARTED DURING THIS VISIT:  New Prescriptions   No medications on file     Note:  This document was prepared using Dragon voice recognition software and may include unintentional dictation errors.    Merlyn Lot, MD 03/13/20 2043

## 2020-03-13 NOTE — ED Notes (Signed)
Pt in interview room speaking to TTS with security present.

## 2020-03-14 ENCOUNTER — Telehealth: Payer: Self-pay | Admitting: Pharmacist

## 2020-03-14 MED ORDER — DIPHENHYDRAMINE HCL 25 MG PO CAPS
50.0000 mg | ORAL_CAPSULE | Freq: Once | ORAL | Status: AC
  Administered 2020-03-14: 50 mg via ORAL
  Filled 2020-03-14: qty 2

## 2020-03-14 MED ORDER — LORAZEPAM 1 MG PO TABS
1.0000 mg | ORAL_TABLET | Freq: Once | ORAL | Status: AC
  Administered 2020-03-14: 1 mg via ORAL
  Filled 2020-03-14: qty 1

## 2020-03-14 NOTE — ED Notes (Signed)
Pt transferred into ED BHU room 1    Patient assigned to appropriate care area. Patient oriented to unit/care area: Informed that, for their safety, care areas are designed for safety and monitored by security cameras at all times; Visiting hours and phone times explained to patient. Patient verbalizes understanding, and verbal contract for safety obtained.   Assessment completed  He denies pain  He is IVC   Psychiatry note states that he will discharge

## 2020-03-14 NOTE — Consult Note (Signed)
The patient was seen yesterday at 8:30 PM by Caroline Sauger, NP.  Per the Attestation signed by Hampton Abbot, MD at 03/14/2020 8:27 AM, "Patient can be observed overnight for stabilization and treatment, reassessed in a.m. and discharged with resources. Crisis and safety planning discussed by NP in length during her assessment"  I reinterviewed the patient and obtained a similar history and additional evidence that the patient has no suicidal intent or lethality issues.  He discussed various outpatient treatment options and we discussed the value of AA as a potential source for him to regain control over alcohol.  He identified a need to support his wife as the significant reason to alter his behavior but recognized the challenge.  We reviewed his past alcohol levels and by comparison the level for this admission was far lower that is it was in the 100s versus in the 300s.  Medicine has been following the patient and there is no evidence of severe alcohol withdrawal or risk of complications of alcohol withdrawal at this time.  I agreed with the above plan and discontinued the IVC and the patient will be discharged.

## 2020-03-14 NOTE — Telephone Encounter (Signed)
03/14/2020 8:20:24 AM - Scripts to provider for Advair/Ventolin  -- Elmer Picker - Thursday, March 14, 2020 8:19 AM --Printed scripts for Advair 100/50 & Ventolin HFA 90--sending to [facility] for provider to sign to send to [place] for refills.

## 2020-03-14 NOTE — ED Notes (Signed)
IVC RESCINDED PER  DR  Satira Sark MD  /  PT NOW  VOL  PENDING  PLACEMENT

## 2020-03-16 ENCOUNTER — Emergency Department
Admission: EM | Admit: 2020-03-16 | Discharge: 2020-03-17 | Disposition: A | Discharge status: Other Acute Inpatient Hospital | Payer: Medicaid Other | Attending: Emergency Medicine | Admitting: Emergency Medicine

## 2020-03-16 DIAGNOSIS — X789XXA Intentional self-harm by unspecified sharp object, initial encounter: Secondary | ICD-10-CM

## 2020-03-16 DIAGNOSIS — I10 Essential (primary) hypertension: Secondary | ICD-10-CM | POA: Insufficient documentation

## 2020-03-16 DIAGNOSIS — Z87891 Personal history of nicotine dependence: Secondary | ICD-10-CM | POA: Insufficient documentation

## 2020-03-16 DIAGNOSIS — Y9389 Activity, other specified: Secondary | ICD-10-CM | POA: Insufficient documentation

## 2020-03-16 DIAGNOSIS — Y999 Unspecified external cause status: Secondary | ICD-10-CM | POA: Insufficient documentation

## 2020-03-16 DIAGNOSIS — F332 Major depressive disorder, recurrent severe without psychotic features: Secondary | ICD-10-CM | POA: Insufficient documentation

## 2020-03-16 DIAGNOSIS — F10220 Alcohol dependence with intoxication, uncomplicated: Secondary | ICD-10-CM | POA: Diagnosis present

## 2020-03-16 DIAGNOSIS — X781XXA Intentional self-harm by knife, initial encounter: Secondary | ICD-10-CM | POA: Insufficient documentation

## 2020-03-16 DIAGNOSIS — F10920 Alcohol use, unspecified with intoxication, uncomplicated: Secondary | ICD-10-CM

## 2020-03-16 DIAGNOSIS — S61512A Laceration without foreign body of left wrist, initial encounter: Secondary | ICD-10-CM

## 2020-03-16 DIAGNOSIS — Z20822 Contact with and (suspected) exposure to covid-19: Secondary | ICD-10-CM | POA: Insufficient documentation

## 2020-03-16 DIAGNOSIS — Y9289 Other specified places as the place of occurrence of the external cause: Secondary | ICD-10-CM | POA: Insufficient documentation

## 2020-03-16 DIAGNOSIS — Z046 Encounter for general psychiatric examination, requested by authority: Secondary | ICD-10-CM | POA: Insufficient documentation

## 2020-03-16 DIAGNOSIS — F1020 Alcohol dependence, uncomplicated: Secondary | ICD-10-CM | POA: Diagnosis present

## 2020-03-16 DIAGNOSIS — Z79899 Other long term (current) drug therapy: Secondary | ICD-10-CM | POA: Insufficient documentation

## 2020-03-16 DIAGNOSIS — F429 Obsessive-compulsive disorder, unspecified: Secondary | ICD-10-CM | POA: Insufficient documentation

## 2020-03-16 LAB — CBC WITH DIFFERENTIAL/PLATELET
Abs Immature Granulocytes: 0.05 10*3/uL (ref 0.00–0.07)
Basophils Absolute: 0.1 10*3/uL (ref 0.0–0.1)
Basophils Relative: 1 %
Eosinophils Absolute: 0.1 10*3/uL (ref 0.0–0.5)
Eosinophils Relative: 1 %
HCT: 41.1 % (ref 39.0–52.0)
Hemoglobin: 13.1 g/dL (ref 13.0–17.0)
Immature Granulocytes: 1 %
Lymphocytes Relative: 17 %
Lymphs Abs: 1.7 10*3/uL (ref 0.7–4.0)
MCH: 29.4 pg (ref 26.0–34.0)
MCHC: 31.9 g/dL (ref 30.0–36.0)
MCV: 92.4 fL (ref 80.0–100.0)
Monocytes Absolute: 0.8 10*3/uL (ref 0.1–1.0)
Monocytes Relative: 8 %
Neutro Abs: 7.3 10*3/uL (ref 1.7–7.7)
Neutrophils Relative %: 72 %
Platelets: 243 10*3/uL (ref 150–400)
RBC: 4.45 MIL/uL (ref 4.22–5.81)
RDW: 16 % — ABNORMAL HIGH (ref 11.5–15.5)
WBC: 10 10*3/uL (ref 4.0–10.5)
nRBC: 0 % (ref 0.0–0.2)

## 2020-03-16 LAB — COMPREHENSIVE METABOLIC PANEL
ALT: 23 U/L (ref 0–44)
AST: 22 U/L (ref 15–41)
Albumin: 4.1 g/dL (ref 3.5–5.0)
Alkaline Phosphatase: 84 U/L (ref 38–126)
Anion gap: 9 (ref 5–15)
BUN: 13 mg/dL (ref 6–20)
CO2: 25 mmol/L (ref 22–32)
Calcium: 9.1 mg/dL (ref 8.9–10.3)
Chloride: 106 mmol/L (ref 98–111)
Creatinine, Ser: 1 mg/dL (ref 0.61–1.24)
GFR calc Af Amer: >60 mL/min (ref >60)
GFR calc non Af Amer: >60 mL/min (ref >60)
Glucose, Bld: 119 mg/dL — ABNORMAL HIGH (ref 70–99)
Potassium: 3.5 mmol/L (ref 3.5–5.1)
Sodium: 140 mmol/L (ref 135–145)
Total Bilirubin: 0.8 mg/dL (ref 0.3–1.2)
Total Protein: 7.5 g/dL (ref 6.5–8.1)

## 2020-03-16 LAB — ETHANOL: Alcohol, Ethyl (B): 230 mg/dL — ABNORMAL HIGH (ref <10)

## 2020-03-16 LAB — RESPIRATORY PANEL BY RT PCR (FLU A&B, COVID)
Influenza A by PCR: NEGATIVE
Influenza B by PCR: NEGATIVE
SARS Coronavirus 2 by RT PCR: NEGATIVE

## 2020-03-16 LAB — URINE DRUG SCREEN, QUALITATIVE (ARMC ONLY)
Amphetamines, Ur Screen: NOT DETECTED
Barbiturates, Ur Screen: NOT DETECTED
Benzodiazepine, Ur Scrn: POSITIVE — AB
Cannabinoid 50 Ng, Ur ~~LOC~~: POSITIVE — AB
Cocaine Metabolite,Ur ~~LOC~~: NOT DETECTED
MDMA (Ecstasy)Ur Screen: NOT DETECTED
Methadone Scn, Ur: NOT DETECTED
Opiate, Ur Screen: NOT DETECTED
Phencyclidine (PCP) Ur S: NOT DETECTED
Tricyclic, Ur Screen: NOT DETECTED

## 2020-03-16 LAB — ACETAMINOPHEN LEVEL: Acetaminophen (Tylenol), Serum: <10 ug/mL — ABNORMAL LOW (ref 10–30)

## 2020-03-16 LAB — SALICYLATE LEVEL: Salicylate Lvl: <7 mg/dL — ABNORMAL LOW (ref 7.0–30.0)

## 2020-03-16 MED ORDER — IBUPROFEN 600 MG PO TABS
600.0000 mg | ORAL_TABLET | Freq: Once | ORAL | Status: AC
  Administered 2020-03-16: 600 mg via ORAL
  Filled 2020-03-16: qty 1

## 2020-03-16 MED ORDER — THIAMINE HCL 100 MG/ML IJ SOLN: 100.0000 mg | Freq: Every day | INTRAMUSCULAR | Status: DC

## 2020-03-16 MED ORDER — LORAZEPAM 2 MG/ML IJ SOLN: 0.0000 mg | Freq: Four times a day (QID) | INTRAMUSCULAR | Status: DC

## 2020-03-16 MED ORDER — IBUPROFEN 600 MG PO TABS: 600.0000 mg | ORAL_TABLET | Freq: Three times a day (TID) | ORAL | Status: DC | PRN

## 2020-03-16 MED ORDER — THIAMINE HCL 100 MG PO TABS
100.0000 mg | ORAL_TABLET | Freq: Every day | ORAL | Status: DC
  Administered 2020-03-16 – 2020-03-17 (×2): 100 mg via ORAL
  Filled 2020-03-16 (×2): qty 1

## 2020-03-16 MED ORDER — LORAZEPAM 2 MG PO TABS: 0.0000 mg | ORAL_TABLET | Freq: Two times a day (BID) | ORAL | Status: DC

## 2020-03-16 MED ORDER — LORAZEPAM 2 MG/ML IJ SOLN: 0.0000 mg | Freq: Two times a day (BID) | INTRAMUSCULAR | Status: DC

## 2020-03-16 MED ORDER — CHLORDIAZEPOXIDE HCL 25 MG PO CAPS
25.0000 mg | ORAL_CAPSULE | Freq: Once | ORAL | Status: AC
  Administered 2020-03-16: 25 mg via ORAL
  Filled 2020-03-16: qty 1

## 2020-03-16 MED ORDER — LORAZEPAM 2 MG PO TABS
0.0000 mg | ORAL_TABLET | Freq: Four times a day (QID) | ORAL | Status: DC
  Administered 2020-03-16 – 2020-03-17 (×5): 2 mg via ORAL
  Filled 2020-03-16 (×5): qty 1

## 2020-03-16 NOTE — ED Notes (Signed)
Hourly rounding reveals patient awake in room. No complaints, stable, in no acute distress. Q15 minute rounds and monitoring via Rover and Officer to continue.  

## 2020-03-16 NOTE — ED Notes (Signed)
Pt offered snack but denied, pt denies any further needs to wants at this time

## 2020-03-16 NOTE — ED Notes (Signed)
Report to include Situation, Background, Assessment, and Recommendations received from [facility]. Patient alert and oriented, warm and dry, in no acute distress. Patient denies SI, HI, AVH and pain. Patient made aware of Q15 minute rounds and security cameras for their safety. Patient instructed to come to me with needs or concerns.

## 2020-03-16 NOTE — ED Provider Notes (Signed)
[facility] Emergency Department Provider Note  ____________________________________________  Time seen: Approximately 10:28 PM  I have reviewed the triage vital signs and the nursing notes.   HISTORY  Chief Complaint Suicide Attempt    Level 5 Caveat: Portions of the History and Physical including HPI and review of systems are unable to be completely obtained due to patient being a poor historian   HPI Miguel [name] is a 59 y.o. male with a history of alcohol abuse anxiety GERD hypertension who comes to the ED after apparent suicide attempt.  Patient is well-known to this department, known alcoholic.  Today he reports that he had 1 small drink of alcohol and then went to his car.  He is evasive with details but does agree that he use something to cut his left wrist.  He is crying and reports that he feels very bad for his wife and is worried that he will not be able to take care of her.  Denies HI or hallucinations.      Past Medical History:  Diagnosis Date  . Alcohol abuse   . Anxiety   . Asthma   . GERD (gastroesophageal reflux disease)   . Gout   . Hypertension   . Kidney stone   . OCD (obsessive compulsive disorder)   . Renal colic      Patient Active Problem List   Diagnosis Date Noted  . Alcohol withdrawal ([place]) 03/04/2020  . Right-sided chest wall pain 02/28/2020  . Alcohol abuse with alcohol-induced mood disorder ([place]) 12/02/2019  . Moderate episode of recurrent major depressive disorder ([place])   . Health care maintenance 10/26/2019  . Right knee pain 10/26/2019  . Generalized anxiety disorder 10/14/2019  . MDD (major depressive disorder), recurrent severe, without psychosis ([place]) 07/27/2019  . Social anxiety disorder 07/27/2019  . Left-sided chest wall pain 06/14/2019  . Alcoholic cirrhosis of liver without ascites ([place]) 06/14/2019  . Alcohol abuse 06/14/2019  . AKI (acute kidney injury) ([place]) 05/27/2019  . Alcohol withdrawal syndrome  without complication ([place]) [id number]  . Alcoholic intoxication without complication ([place])   . Sepsis ([place]) 03/08/2019  . Breast lump or mass 10/04/2018  . Sepsis secondary to UTI ([place]) 09/14/2018  . Asthma 07/07/2018  . GERD (gastroesophageal reflux disease) 07/07/2018  . OCD (obsessive compulsive disorder) 07/07/2018  . Self-inflicted laceration of wrist ([place]) 03/10/2018  . Leg hematoma 12/25/2017  . Suicide and self-inflicted injury by cutting and piercing instrument ([place]) 03/10/2017  . Severe recurrent major 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 ([place]) 03/09/2017  . Substance induced mood disorder ([place]) 08/15/2016  . Involuntary commitment 08/15/2016  . Alcohol use disorder, severe, dependence ([place]) 02/05/2016  . Hypertension 12/05/2015  . Tachycardia 12/05/2015  . Gout 11/13/2015  . Chronic back pain 05/02/2015     Past Surgical History:  Procedure Laterality Date  . CHOLECYSTECTOMY  2012  . EXTRACORPOREAL SHOCK WAVE LITHOTRIPSY Left 01/12/2019   Procedure: EXTRACORPOREAL SHOCK WAVE LITHOTRIPSY (ESWL);  Surgeon: Billey Co, MD;  Location: [facility] ORS;  Service: Urology;  Laterality: Left;     Prior to Admission medications   Medication Sig Start Date End Date Taking? Authorizing Provider  albuterol (VENTOLIN HFA) 108 (90 Base) MCG/ACT inhaler Inhale 2 puffs into the lungs every 6 (six) hours as needed.    [provider]  allopurinol (ZYLOPRIM) 300 MG tablet Take 1 tablet (300 mg total) by mouth daily. 02/28/20   Iloabachie, Chioma E, NP  ascorbic acid (VITAMIN C) 500 MG tablet  Take 1 tablet (500 mg total) by mouth daily. 08/01/19   Clapacs, Madie Reno, MD  chlordiazePOXIDE (LIBRIUM) 25 MG capsule Day 1: take 50 mg (2 capsules) in AM, afternoon, and evening.  Day 2: take 50 mg (2 capsules) AM and PM.  Day 3: take 50 mg (2 capsules) in AM 03/12/20   Lilia Pro., MD  citalopram (CELEXA) 20 MG tablet Take 1 tablet (20 mg total) by mouth daily. 12/21/19 12/20/20  Sable Feil, PA-C  cloNIDine (CATAPRES) 0.1 MG tablet Take 1 tablet (0.1 mg total) by mouth daily. 12/20/19   Iloabachie, Chioma E, NP  colchicine 0.6 MG tablet Take 1 tablet (0.6 mg total) by mouth daily as needed. 08/01/19   Clapacs, Madie Reno, MD  Fluticasone-Salmeterol (ADVAIR DISKUS) 100-50 MCG/DOSE AEPB Inhale 1 puff into the lungs 2 (two) times daily. 09/01/18   Tukov-Yual, Arlyss Gandy, NP  folic acid (FOLVITE) 1 MG tablet Take 1 tablet (1 mg total) by mouth daily. 01/08/20   Iloabachie, Chioma E, NP  lisinopril (ZESTRIL) 40 MG tablet Take 1 tablet (40 mg total) by mouth daily. 10/26/19   Iloabachie, Chioma E, NP  Multiple Vitamins-Iron (MULTIVITAMINS WITH IRON) TABS tablet Take 1 tablet by mouth daily. 08/01/19   Clapacs, Madie Reno, MD  pantoprazole (PROTONIX) 40 MG tablet Take 1 tablet (40 mg total) by mouth daily. 01/08/20   Iloabachie, Chioma E, NP  QUEtiapine (SEROQUEL) 50 MG tablet Take 1 tablet (50 mg total) by mouth at bedtime. 10/14/19   Patrecia Pour, NP     Allergies Percocet [oxycodone-acetaminophen]   Family History  Problem Relation Age of Onset  . Alcohol abuse Father   . Breast cancer Mother 38    Social History Social History   Tobacco Use  . Smoking status: Former Smoker    Types: Cigarettes  . Smokeless tobacco: Never Used  . Tobacco comment: quit 30 years ago  Substance Use Topics  . Alcohol use: Yes    Alcohol/week: 12.0 standard drinks    Types: 12 Shots of liquor per week    Comment: pint daily  . Drug use: No    Review of Systems Level 5 Caveat: Portions of the History and Physical including HPI and review of systems are unable to be completely obtained due to patient being a poor historian   Constitutional:   No known fever.  ENT:   No rhinorrhea. Cardiovascular:   No chest pain or syncope. Respiratory:   No dyspnea or cough. Gastrointestinal:   Negative for abdominal pain, vomiting and diarrhea.  Musculoskeletal:   Negative for focal pain or  swelling ____________________________________________   PHYSICAL EXAM:  VITAL SIGNS: ED Triage Vitals  Enc Vitals Group     BP 03/16/20 1638 (!) 166/117     Pulse Rate 03/16/20 1638 (!) 106     Resp 03/16/20 1638 18     Temp 03/16/20 1638 98.4 F (36.9 C)     Temp Source 03/16/20 1638 Oral     SpO2 03/16/20 1638 97 %     Weight 03/16/20 1640 178 lb 9.2 oz (81 kg)     Height 03/16/20 1640 6' (1.829 m)     Head Circumference --      Peak Flow --      Pain Score 03/16/20 1639 0     Pain Loc --      Pain Edu? --      Excl. in [place]? --     Vital signs  reviewed, nursing assessments reviewed.   Constitutional:   Alert and oriented. Non-toxic appearance. Eyes:   Conjunctivae are normal. EOMI. PERRL. ENT      Head:   Normocephalic and atraumatic.      Nose:   No congestion/rhinnorhea.       Mouth/Throat:   MMM, no pharyngeal erythema. No peritonsillar mass.       Neck:   No meningismus. Full ROM. Hematological/Lymphatic/Immunilogical:   No cervical lymphadenopathy. Cardiovascular:   RRR. Symmetric bilateral radial and DP pulses.  No murmurs. Cap refill less than 2 seconds. Respiratory:   Normal respiratory effort without tachypnea/retractions. Breath sounds are clear and equal bilaterally. No wheezes/rales/rhonchi. Gastrointestinal:   Soft and nontender. Non distended. There is no CVA tenderness.  No rebound, rigidity, or guarding.  Musculoskeletal:   Normal range of motion in all extremities. No joint effusions.  No lower extremity tenderness.  No edema. Neurologic:   Normal speech and language.  Motor grossly intact. No acute focal neurologic deficits are appreciated.  Skin:    Skin is warm, dry with 4 small linear lacerations over the volar left wrist.  Hemostatic, no pulsatile bleeding.. No rash noted.  No petechiae, purpura, or bullae.  ____________________________________________    LABS (pertinent positives/negatives) (all labs ordered are listed, but only abnormal  results are displayed) Labs Reviewed  ACETAMINOPHEN LEVEL - Abnormal; Notable for the following components:      Result Value   Acetaminophen (Tylenol), Serum <10 (*)    All other components within normal limits  COMPREHENSIVE METABOLIC PANEL - Abnormal; Notable for the following components:   Glucose, Bld 119 (*)    All other components within normal limits  ETHANOL - Abnormal; Notable for the following components:   Alcohol, Ethyl (B) 230 (*)    All other components within normal limits  SALICYLATE LEVEL - Abnormal; Notable for the following components:   Salicylate Lvl [id number] (*)    All other components within normal limits  CBC WITH DIFFERENTIAL/PLATELET - Abnormal; Notable for the following components:   RDW 16.0 (*)    All other components within normal limits  URINE DRUG SCREEN, QUALITATIVE ([facility] ONLY) - Abnormal; Notable for the following components:   Cannabinoid 50 Ng, Ur [place] POSITIVE (*)    Benzodiazepine, Ur Scrn POSITIVE (*)    All other components within normal limits  RESPIRATORY PANEL BY RT PCR (FLU A&B, COVID)   ____________________________________________   EKG    ____________________________________________    RADIOLOGY  No results found.  ____________________________________________   PROCEDURES .Marland KitchenLaceration Repair  Date/Time: 03/16/2020 10:30 PM Performed by: Carrie Mew, MD Authorized by: Carrie Mew, MD   Consent:    Consent obtained:  Verbal   Consent given by:  Patient   Risks discussed:  Infection, pain, retained foreign body, poor cosmetic result and poor wound healing Anesthesia (see MAR for exact dosages):    Anesthesia method:  None Laceration details:    Location:  Shoulder/arm   Shoulder/arm location:  L lower arm   Length (cm):  2 Repair type:    Repair type:  Simple Pre-procedure details:    Preparation:  Patient was prepped and draped in usual sterile fashion Exploration:    Hemostasis achieved with:  Direct  pressure   Wound exploration: wound explored through full range of motion and entire depth of wound probed and visualized     Wound extent: no foreign bodies/material noted, no muscle damage noted, no nerve damage noted, no tendon damage noted, no underlying  fracture noted and no vascular damage noted     Contaminated: no   Treatment:    Area cleansed with:  Saline and Betadine   Amount of cleaning:  Extensive   Irrigation solution:  Sterile saline   Visualized foreign bodies/material removed: no   Skin repair:    Repair method:  Tissue adhesive Approximation:    Approximation:  Close Post-procedure details:    Dressing:  Open (no dressing)   Patient tolerance of procedure:  Tolerated well, no immediate complications Comments:     Proximal pair of lacerations     .Marland KitchenLaceration Repair  Date/Time: 03/16/2020 10:32 PM Performed by: Carrie Mew, MD Authorized by: Carrie Mew, MD   Consent:    Consent obtained:  Verbal   Consent given by:  Patient   Risks discussed:  Infection, pain, retained foreign body, poor cosmetic result and poor wound healing Anesthesia (see MAR for exact dosages):    Anesthesia method:  None Laceration details:    Location:  Shoulder/arm   Shoulder/arm location:  L lower arm   Length (cm):  3 Repair type:    Repair type:  Simple Exploration:    Hemostasis achieved with:  Direct pressure   Wound exploration: wound explored through full range of motion and entire depth of wound probed and visualized     Wound extent: no fascia violation noted, no foreign bodies/material noted, no muscle damage noted, no nerve damage noted, no tendon damage noted, no underlying fracture noted and no vascular damage noted     Contaminated: no   Treatment:    Area cleansed with:  Saline and Betadine   Amount of cleaning:  Extensive   Irrigation solution:  Sterile saline   Visualized foreign bodies/material removed: no   Skin repair:    Repair method:  Tissue  adhesive Approximation:    Approximation:  Close Post-procedure details:    Dressing:  Open (no dressing)   Patient tolerance of procedure:  Tolerated well, no immediate complications    ____________________________________________    CLINICAL IMPRESSION / ASSESSMENT AND PLAN / ED COURSE  Medications ordered in the ED: Medications  LORazepam (ATIVAN) injection 0-4 mg ( Intravenous See Alternative 03/16/20 2040)    Or  LORazepam (ATIVAN) tablet 0-4 mg (2 mg Oral Given 03/16/20 2040)  LORazepam (ATIVAN) injection 0-4 mg (has no administration in time range)    Or  LORazepam (ATIVAN) tablet 0-4 mg (has no administration in time range)  thiamine tablet 100 mg (100 mg Oral Given 03/16/20 2040)    Or  thiamine (B-1) injection 100 mg ( Intravenous See Alternative 03/16/20 2040)  ibuprofen (ADVIL) tablet 600 mg (has no administration in time range)  chlordiazePOXIDE (LIBRIUM) capsule 25 mg (25 mg Oral Given 03/16/20 1830)  ibuprofen (ADVIL) tablet 600 mg (600 mg Oral Given 03/16/20 1830)    Pertinent labs & imaging results that were available during my care of the patient were reviewed by me and considered in my medical decision making (see chart for details).   BROOKE BARBOUR was evaluated in Emergency Department on 03/16/2020 for the symptoms described in the history of present illness. He was evaluated in the context of the global COVID-19 pandemic, which necessitated consideration that the patient might be at risk for infection with the SARS-CoV-2 virus that causes COVID-19. Institutional protocols and algorithms that pertain to the evaluation of patients at risk for COVID-19 are in a state of rapid change based on information released by regulatory bodies including the CDC  and federal and state organizations. These policies and algorithms were followed during the patient's care in the ED.   Patient presents with alcohol intoxication, self-inflicted lacerations of the left wrist.  I will IVC for  safety, wounds cleansed and repaired with tissue adhesive.  The patient has been placed in psychiatric observation due to the need to provide a safe environment for the patient while obtaining psychiatric consultation and evaluation, as well as ongoing medical and medication management to treat the patient's condition.  The patient has been placed under full IVC at this time.  ----------------------------------------- 10:30 PM on 03/16/2020 -----------------------------------------  Discussed with psychiatry nurse practitioner who was advised that they will reassess the patient tomorrow after he has sobered up.  Starting CIWA protocol in the meantime given his alcoholism.      ____________________________________________   FINAL CLINICAL IMPRESSION(S) / ED DIAGNOSES    Final diagnoses:  Alcoholic intoxication without complication ([place])  Self-inflicted laceration of left wrist [facility])     ED Discharge Orders    None      Portions of this note were generated with dragon dictation software. Dictation errors may occur despite best attempts at proofreading.   Carrie Mew, MD 03/16/20 2233

## 2020-03-16 NOTE — ED Notes (Signed)
Pt requesting and provided with sandwich tray and water, pt has not further complaints or needs at this time

## 2020-03-16 NOTE — ED Notes (Signed)
Hourly rounding reveals patient awake in room. Stable, in no acute distress. Q15 minute rounds and monitoring via Rover and Officer to continue.  

## 2020-03-16 NOTE — ED Triage Notes (Signed)
Patient to ED from home via [facility] c/o [facility]. Per EMS patient was discovered with self-inflicted wounds to left forearm in attempt to kill self. Patient on arrival is tearful and reports "wanting to die".

## 2020-03-16 NOTE — ED Notes (Signed)
Hourly rounding reveals patient awake in room. Stable, in no acute distress. Q15 minute rounds and monitoring via Engineer, drilling to continue.See CIWA.

## 2020-03-16 NOTE — BH Assessment (Signed)
Assessment Note  Miguel [name] is an 59 y.o. male presenting to [facility] ED via EMS. Per triage note Patient to ED from home via [facility] c/o [facility]. Per EMS patient was discovered with self-inflicted wounds to left forearm in attempt to kill self. Patient on arrival is tearful and reports "wanting to die". During assessment patient was alert, oriented to self and place but unaware of the situation. When asked why patient was presenting to ED patient reported "I was here yesterday, I stayed one night and I wanted to go home and they brought me in here without me knowing" Patient reported " someone brought me in here, they took me to [place] and the police brought me here." When asked why he thinks the police brought him here "I think because I was at home upset sitting gon the car." Patient did present to [facility] ED on 4/28 and was brought in by police under IVC given by [place] for his Alcohol abuse. Patients BAL today is 230 and UDS is positive fore Benzodiazepines and Cannabinoids. Patient reported that he did drink today "I only drank a little." Patient showed Psychiatric team a cut he made on his right arm and reported that he did that today while in the ED "because they wouldn't get me any food." Patient denies SI/HI/AH/VH and does not appear to be responding to any internal or external stimuli.  Patient's brother Miguel [name] [phone] provided collateral information "my brother is an alcoholic and he is a caregiver for his wife and he's overwhelmed by it and it's adding to it, it's a very dangerous drinking pattern. He has a history of hurting himself and he was there a couple of weeks ago and his BAL was high to dangerous levels, I'm concerned that if he leaves he is going to drink himself to death."   Per Psyc NP patient will be observed overnight and reassessed in the morning.   Diagnosis: Alcohol Abuse  Past Medical History:  Past Medical History:  Diagnosis Date  . Alcohol abuse   . Anxiety   . Asthma   . GERD  (gastroesophageal reflux disease)   . Gout   . Hypertension   . Kidney stone   . OCD (obsessive compulsive disorder)   . Renal colic     Past Surgical History:  Procedure Laterality Date  . CHOLECYSTECTOMY  2012  . EXTRACORPOREAL SHOCK WAVE LITHOTRIPSY Left 01/12/2019   Procedure: EXTRACORPOREAL SHOCK WAVE LITHOTRIPSY (ESWL);  Surgeon: Billey Co, MD;  Location: [facility] ORS;  Service: Urology;  Laterality: Left;    Family History:  Family History  Problem Relation Age of Onset  . Alcohol abuse Father   . Breast cancer Mother 34    Social History:  reports that he has quit smoking. His smoking use included cigarettes. He has never used smokeless tobacco. He reports current alcohol use of about 12.0 standard drinks of alcohol per week. He reports that he does not use drugs.  Additional Social History:  Alcohol / Drug Use Pain Medications: See MAR Prescriptions: See MAR Over the Counter: See MAR History of alcohol / drug use?: Yes Substance #1 Name of Substance 1: Alcohol  CIWA: CIWA-Ar BP: 139/85 Pulse Rate: 96 Nausea and Vomiting: no nausea and no vomiting Tactile Disturbances: none Tremor: moderate, with patient's arms extended Auditory Disturbances: not present Paroxysmal Sweats: no sweat visible Visual Disturbances: not present Anxiety: five Headache, Fullness in Head: none present Agitation: moderately fidgety and restless Orientation and Clouding of Sensorium: cannot  do serial additions or is uncertain about date CIWA-Ar Total: 14 COWS:    Allergies:  Allergies  Allergen Reactions  . Percocet [Oxycodone-Acetaminophen] Nausea And Vomiting    Home Medications: (Not in a hospital admission)   OB/GYN Status:  No LMP for male patient.  General Assessment Data Location of Assessment: [facility] ED TTS Assessment: In system Is this a Tele or Face-to-Face Assessment?: Face-to-Face Is this an Initial Assessment or a Re-assessment for this encounter?: Initial  Assessment Patient Accompanied by:: N/A Language Other than English: No Living Arrangements: Other (Comment)(Private Residence) What gender do you identify as?: Male Marital status: Married Living Arrangements: Spouse/significant other Can pt return to current living arrangement?: Yes Admission Status: Involuntary Petitioner: ED Attending Is patient capable of signing voluntary admission?: No Referral Source: Other Insurance type: None  Medical Screening Exam ([place]) Medical Exam completed: Yes  Crisis Care Plan Living Arrangements: Spouse/significant other Legal Guardian: Other:(Self) Name of Psychiatrist: None Name of Therapist: None  Education Status Is patient currently in school?: No Is the patient employed, unemployed or receiving disability?: Unemployed  Risk to self with the past 6 months Suicidal Ideation: No Has patient been a risk to self within the past 6 months prior to admission? : No Suicidal Intent: No Has patient had any suicidal intent within the past 6 months prior to admission? : No Is patient at risk for suicide?: No Suicidal Plan?: No Has patient had any suicidal plan within the past 6 months prior to admission? : No Access to Means: No What has been your use of drugs/alcohol within the last 12 months?: Alcohol Previous Attempts/Gestures: (Unknown) How many times?: (Unknown) Other Self Harm Risks: History of cutting Triggers for Past Attempts: None known Intentional Self Injurious Behavior: Cutting Comment - Self Injurious Behavior: History of cutting Family Suicide History: No Recent stressful life event(s): Other (Comment)(Illness of wife) Persecutory voices/beliefs?: No Depression: (Denies) Depression Symptoms: (Patient currently intoxicated) Substance abuse history and/or treatment for substance abuse?: Yes Suicide prevention information given to non-admitted patients: Not applicable  Risk to Others within the past 6  months Homicidal Ideation: No Does patient have any lifetime risk of violence toward others beyond the six months prior to admission? : No Thoughts of Harm to Others: No Current Homicidal Intent: No Current Homicidal Plan: No Access to Homicidal Means: No Identified Victim: None reported History of harm to others?: No Assessment of Violence: None Noted Does patient have access to weapons?: No Criminal Charges Pending?: No Does patient have a court date: No Is patient on probation?: No  Psychosis Hallucinations: None noted Delusions: None noted  Mental Status Report Appearance/Hygiene: In scrubs Eye Contact: Fair Motor Activity: Freedom of movement Speech: Pressured, Slurred Level of Consciousness: Alert Mood: Irritable Affect: Appropriate to circumstance Anxiety Level: Minimal Thought Processes: Circumstantial Judgement: Partial Orientation: Person, Place, Time Obsessive Compulsive Thoughts/Behaviors: None  Cognitive Functioning Concentration: Decreased Memory: Remote Intact, Recent Impaired Is patient IDD: No Insight: Poor Impulse Control: Poor Appetite: Fair Have you had any weight changes? : No Change Sleep: Decreased Total Hours of Sleep: 5 Vegetative Symptoms: None  ADLScreening [facility] Assessment Services) Patient's cognitive ability adequate to safely complete daily activities?: Yes Patient able to express need for assistance with ADLs?: Yes Independently performs ADLs?: Yes (appropriate for developmental age)  Prior Inpatient Therapy Prior Inpatient Therapy: Yes Prior Therapy Dates: 2020  Prior Therapy Facilty/Provider(s): [facility] Reason for Treatment: Depression, Anxiety, Alcohol Abuse  Prior Outpatient Therapy Prior Outpatient Therapy: No Does patient have  an ACCT team?: No Does patient have Intensive In-House Services?  : No Does patient have [facility] services? : No Does patient have P4CC services?: No  ADL Screening (condition at time of  admission) Patient's cognitive ability adequate to safely complete daily activities?: Yes Is the patient deaf or have difficulty hearing?: No Does the patient have difficulty seeing, even when wearing glasses/contacts?: No Does the patient have difficulty concentrating, remembering, or making decisions?: No Patient able to express need for assistance with ADLs?: Yes Does the patient have difficulty dressing or bathing?: No Independently performs ADLs?: Yes (appropriate for developmental age) Does the patient have difficulty walking or climbing stairs?: No Weakness of Legs: None Weakness of Arms/Hands: None  Home Assistive Devices/Equipment Home Assistive Devices/Equipment: None  Therapy Consults (therapy consults require a physician order) PT Evaluation Needed: No OT Evalulation Needed: No SLP Evaluation Needed: No Abuse/Neglect Assessment (Assessment to be complete while patient is alone) Abuse/Neglect Assessment Can Be Completed: Yes Physical Abuse: Denies Verbal Abuse: Denies Sexual Abuse: Denies Exploitation of patient/patient's resources: Denies Self-Neglect: Denies Values / Beliefs Cultural Requests During Hospitalization: None Spiritual Requests During Hospitalization: None Consults Spiritual Care Consult Needed: No Transition of Care Team Consult Needed: No Advance Directives (For Healthcare) Does Patient Have a Medical Advance Directive?: No Would patient like information on creating a medical advance directive?: No - Patient declined          Disposition: Per Psyc NP patient will be observed overnight and reassessed in the morning. Disposition Initial Assessment Completed for this Encounter: Yes  On Site Evaluation by:   Reviewed with Physician:    Leonie Douglas [place] 03/16/2020 9:54 PM

## 2020-03-17 ENCOUNTER — Inpatient Hospital Stay
Admission: EM | Admit: 2020-03-17 | Discharge: 2020-03-21 | DRG: 885 | Disposition: A | Discharge status: Home / Self Care | Payer: No Typology Code available for payment source | Source: Intra-hospital | Attending: Psychiatry | Admitting: Psychiatry

## 2020-03-17 DIAGNOSIS — Z811 Family history of alcohol abuse and dependence: Secondary | ICD-10-CM

## 2020-03-17 DIAGNOSIS — K219 Gastro-esophageal reflux disease without esophagitis: Secondary | ICD-10-CM | POA: Diagnosis present

## 2020-03-17 DIAGNOSIS — F332 Major depressive disorder, recurrent severe without psychotic features: Secondary | ICD-10-CM | POA: Diagnosis present

## 2020-03-17 DIAGNOSIS — F429 Obsessive-compulsive disorder, unspecified: Secondary | ICD-10-CM | POA: Diagnosis present

## 2020-03-17 DIAGNOSIS — Z803 Family history of malignant neoplasm of breast: Secondary | ICD-10-CM

## 2020-03-17 DIAGNOSIS — F10920 Alcohol use, unspecified with intoxication, uncomplicated: Secondary | ICD-10-CM | POA: Diagnosis not present

## 2020-03-17 DIAGNOSIS — X789XXA Intentional self-harm by unspecified sharp object, initial encounter: Secondary | ICD-10-CM

## 2020-03-17 DIAGNOSIS — Z885 Allergy status to narcotic agent status: Secondary | ICD-10-CM

## 2020-03-17 DIAGNOSIS — X789XXD Intentional self-harm by unspecified sharp object, subsequent encounter: Secondary | ICD-10-CM | POA: Diagnosis present

## 2020-03-17 DIAGNOSIS — F10229 Alcohol dependence with intoxication, unspecified: Secondary | ICD-10-CM | POA: Diagnosis present

## 2020-03-17 DIAGNOSIS — F10220 Alcohol dependence with intoxication, uncomplicated: Secondary | ICD-10-CM | POA: Diagnosis present

## 2020-03-17 DIAGNOSIS — I10 Essential (primary) hypertension: Secondary | ICD-10-CM | POA: Diagnosis present

## 2020-03-17 DIAGNOSIS — S61512A Laceration without foreign body of left wrist, initial encounter: Secondary | ICD-10-CM | POA: Diagnosis present

## 2020-03-17 DIAGNOSIS — S61512D Laceration without foreign body of left wrist, subsequent encounter: Secondary | ICD-10-CM

## 2020-03-17 DIAGNOSIS — Y908 Blood alcohol level of 240 mg/100 ml or more: Secondary | ICD-10-CM | POA: Diagnosis present

## 2020-03-17 DIAGNOSIS — M109 Gout, unspecified: Secondary | ICD-10-CM

## 2020-03-17 DIAGNOSIS — F1020 Alcohol dependence, uncomplicated: Secondary | ICD-10-CM | POA: Diagnosis present

## 2020-03-17 MED ORDER — ALLOPURINOL 300 MG PO TABS
300.0000 mg | ORAL_TABLET | Freq: Every day | ORAL | Status: DC
  Administered 2020-03-18 – 2020-03-21 (×4): 300 mg via ORAL
  Filled 2020-03-17 (×4): qty 1

## 2020-03-17 MED ORDER — QUETIAPINE FUMARATE 25 MG PO TABS
50.0000 mg | ORAL_TABLET | Freq: Every day | ORAL | Status: DC
  Administered 2020-03-17 – 2020-03-20 (×4): 50 mg via ORAL
  Filled 2020-03-17 (×4): qty 2

## 2020-03-17 MED ORDER — ASCORBIC ACID 500 MG PO TABS
500.0000 mg | ORAL_TABLET | Freq: Every day | ORAL | Status: DC
  Administered 2020-03-18 – 2020-03-21 (×4): 500 mg via ORAL
  Filled 2020-03-17 (×5): qty 1

## 2020-03-17 MED ORDER — THIAMINE HCL 100 MG PO TABS
100.0000 mg | ORAL_TABLET | Freq: Every day | ORAL | Status: DC
  Administered 2020-03-18 – 2020-03-21 (×4): 100 mg via ORAL
  Filled 2020-03-17 (×4): qty 1

## 2020-03-17 MED ORDER — ALUM & MAG HYDROXIDE-SIMETH 200-200-20 MG/5ML PO SUSP: 30.0000 mL | ORAL | Status: DC | PRN

## 2020-03-17 MED ORDER — DIPHENHYDRAMINE HCL 25 MG PO CAPS
25.0000 mg | ORAL_CAPSULE | Freq: Four times a day (QID) | ORAL | Status: DC | PRN
  Administered 2020-03-19: 25 mg via ORAL
  Filled 2020-03-17: qty 1

## 2020-03-17 MED ORDER — FOLIC ACID 1 MG PO TABS
1.0000 mg | ORAL_TABLET | Freq: Every day | ORAL | Status: DC
  Administered 2020-03-18 – 2020-03-21 (×4): 1 mg via ORAL
  Filled 2020-03-17 (×4): qty 1

## 2020-03-17 MED ORDER — LORAZEPAM 2 MG PO TABS: 0.0000 mg | ORAL_TABLET | Freq: Two times a day (BID) | ORAL | Status: DC

## 2020-03-17 MED ORDER — CITALOPRAM HYDROBROMIDE 20 MG PO TABS
20.0000 mg | ORAL_TABLET | Freq: Every day | ORAL | Status: DC
  Administered 2020-03-18 – 2020-03-21 (×4): 20 mg via ORAL
  Filled 2020-03-17 (×4): qty 1

## 2020-03-17 MED ORDER — IBUPROFEN 600 MG PO TABS
600.0000 mg | ORAL_TABLET | Freq: Three times a day (TID) | ORAL | Status: DC | PRN
  Administered 2020-03-17: 600 mg via ORAL
  Filled 2020-03-17 (×2): qty 1

## 2020-03-17 MED ORDER — PANTOPRAZOLE SODIUM 40 MG PO TBEC
40.0000 mg | DELAYED_RELEASE_TABLET | Freq: Every day | ORAL | Status: DC
  Administered 2020-03-18 – 2020-03-21 (×4): 40 mg via ORAL
  Filled 2020-03-17 (×4): qty 1

## 2020-03-17 MED ORDER — OLANZAPINE 5 MG PO TBDP
5.0000 mg | ORAL_TABLET | Freq: Every day | ORAL | Status: DC
  Administered 2020-03-17: 5 mg via ORAL
  Filled 2020-03-17: qty 1

## 2020-03-17 MED ORDER — LISINOPRIL 20 MG PO TABS
40.0000 mg | ORAL_TABLET | Freq: Every day | ORAL | Status: DC
  Administered 2020-03-18 – 2020-03-21 (×4): 40 mg via ORAL
  Filled 2020-03-17 (×4): qty 2

## 2020-03-17 MED ORDER — MAGNESIUM HYDROXIDE 400 MG/5ML PO SUSP: 30.0000 mL | Freq: Every day | ORAL | Status: DC | PRN

## 2020-03-17 MED ORDER — CHLORDIAZEPOXIDE HCL 25 MG PO CAPS
25.0000 mg | ORAL_CAPSULE | Freq: Once | ORAL | Status: AC
  Administered 2020-03-17: 25 mg via ORAL
  Filled 2020-03-17: qty 1

## 2020-03-17 MED ORDER — LORAZEPAM 2 MG/ML IJ SOLN: 0.0000 mg | Freq: Four times a day (QID) | INTRAMUSCULAR | Status: DC

## 2020-03-17 MED ORDER — LORAZEPAM 2 MG/ML IJ SOLN: 0.0000 mg | Freq: Two times a day (BID) | INTRAMUSCULAR | Status: DC

## 2020-03-17 MED ORDER — TAB-A-VITE/IRON PO TABS
1.0000 | ORAL_TABLET | Freq: Every day | ORAL | Status: DC
  Administered 2020-03-18 – 2020-03-19 (×2): 1 via ORAL
  Filled 2020-03-17 (×2): qty 1

## 2020-03-17 MED ORDER — THIAMINE HCL 100 MG/ML IJ SOLN: 100.0000 mg | Freq: Every day | INTRAMUSCULAR | Status: DC

## 2020-03-17 MED ORDER — LORAZEPAM 2 MG PO TABS
0.0000 mg | ORAL_TABLET | Freq: Four times a day (QID) | ORAL | Status: DC
  Administered 2020-03-18 (×3): 2 mg via ORAL
  Filled 2020-03-17 (×3): qty 1

## 2020-03-17 MED ORDER — IBUPROFEN 600 MG PO TABS
600.0000 mg | ORAL_TABLET | Freq: Once | ORAL | Status: DC
  Filled 2020-03-17: qty 1

## 2020-03-17 NOTE — ED Notes (Signed)
Pt denies SI/HI/AVH. Pt states he is ready to leave, advised he has to talk to psychiatrist NP to discuss discharge. Pt verbalized understanding. Able to contract for safety.

## 2020-03-17 NOTE — ED Notes (Signed)
Report to include Situation, Background, Assessment, and Recommendations received from Dorothy RN. Patient alert and oriented, warm and dry, in no acute distress. Patient denies SI, HI, AVH and pain. Patient made aware of Q15 minute rounds and security cameras for their safety. Patient instructed to come to me with needs or concerns.  

## 2020-03-17 NOTE — ED Notes (Signed)
Pt observed hitting the door with his R fist. Pt states he does not want to go downstairs. Pt states "they are making it harder than it was last time". Pt tearful and states he needs to be in a room by himself when down there and that he knows karate. Pt informed writer does not know the arrangement downstairs but will inform him when he does get a bed available to him. Pt advised his behavior now is what certainly necessitates admission because it is considered hurting himself. Pt requesting he be given something for agitation/anxiety.

## 2020-03-17 NOTE — Consult Note (Signed)
  Patient reassessed on morning rounds. Patient presented to [facility] with suicidal thoughts, self harm, and alcohol abuse. Patient is very familiar to our services here. Today he presents with BAL of [id number], with self inflicted wounds that require medical interventions (lacerations to bilateral forearm). Patient with increased stressors to include alcohol use, caregiver to wife, and previous psych history. Patient with minimal insight into his drinking habits, that has resulted in multiple ER visits, medical admission and increase in suicidality. At this patient patient is under IVC, and continues to meet criteria for such.  Will recommend inpatient for detox, medication management, and therapy. Patient was tearful during the evaluation and appears withdrawn.

## 2020-03-17 NOTE — ED Notes (Signed)
Pt given meal tray.

## 2020-03-17 NOTE — ED Notes (Signed)
Pt states he will not eat or drink anything when he goes downstairs. Refused his dinner tray

## 2020-03-17 NOTE — ED Notes (Signed)
Hourly rounding reveals patient sleeping in room. No complaints, stable, in no acute distress. Q15 minute rounds and monitoring via Security Cameras to continue. 

## 2020-03-17 NOTE — Consult Note (Signed)
[place] [facility] Consult   Reason for Consult: Psychiatric evaluation  Referring Physician:  Dr. Joni Fears  Patient Identification: Miguel [name] MRN:  [id number] Principal Diagnosis: <principal problem not specified> Diagnosis:  Active Problems:   Alcohol use disorder, severe, dependence ([place])   Total Time spent with patient: 45 minutes  Subjective:   Miguel [name] is a 59 y.o. male patient admitted with   HPI:  Miguel [name], 59 y.o., male patient presented to [facility]  Patient seen via telepsych by TTS and this provider; chart reviewed and consulted with Dr. Dwyane Dee on 03/17/20.    On evaluation Miguel [name] reports Per triage note Patient to ED from home via [facility] c/o [facility]. Per EMS patient was discovered with self-inflicted wounds to left forearm in attempt to kill self. Patient on arrival is tearful and reports "wanting to die". During assessment patient was alert, oriented to self and place but unaware of the situation. When asked why patient was presenting to ED patient reported "I was here yesterday, I stayed one night and I wanted to go home and they brought me in here without me knowing" Patient reported " someone brought me in here, they took me to [place] and the police brought me here." When asked why he thinks the police brought him here "I think because I was at home upset sitting gon the car." Patient did present to [facility] ED on 4/28 and was brought in by police under IVC given by [place] for his Alcohol abuse. Patients BAL today is 230 and UDS is positive fore Benzodiazepines and Cannabinoids. Patient reported that he did drink today "I only drank a little." Patient showed Psychiatric team a cut he made on his right arm and reported that he did that today while in the ED "because they wouldn't get me any food." Patient denies SI/HI/AH/VH and does not appear to be responding to any internal or external stimuli.   Patient has many visits to [facility] with similar presentations.   Patient will be observed overnight and reassesses in the am when sober.  Patient's brother Liliane [name] [phone] provided collateral information "my brother is an alcoholic and he is a caregiver for his wife and he's overwhelmed by it and it's adding to it, it's a very dangerous drinking pattern. He has a history of hurting himself and he was there a couple of weeks ago and his BAL was high to dangerous levels, I'm concerned that if he leaves he is going to drink himself to death."    Recommendations: patient will be observed overnight and reassessed in the morning.   Disposition: No evidence of imminent risk to self or others at present.   Recommend psychiatric Inpatient admission when medically cleared. Patient does not meet criteria for psychiatric inpatient admission. Supportive therapy provided about ongoing stressors. Refer to IOP. Discussed crisis plan, support from social network, calling 911, coming to the Emergency Department, and calling Suicide Hotline  Past Psychiatric History: alcohol abuse severe  Risk to Self: Suicidal Ideation: No Suicidal Intent: No Is patient at risk for suicide?: No Suicidal Plan?: No Access to Means: No What has been your use of drugs/alcohol within the last 12 months?: Alcohol How many times?: (Unknown) Other Self Harm Risks: History of cutting Triggers for Past Attempts: None known Intentional Self Injurious Behavior: Cutting Comment - Self Injurious Behavior: History of cutting Risk to Others: Homicidal Ideation: No Thoughts of Harm to Others: No Current Homicidal Intent: No Current Homicidal Plan:  No Access to Homicidal Means: No Identified Victim: None reported History of harm to others?: No Assessment of Violence: None Noted Does patient have access to weapons?: No Criminal Charges Pending?: No Does patient have a court date: No Prior Inpatient Therapy: Prior Inpatient Therapy: Yes Prior Therapy Dates: 2020  Prior Therapy  Facilty/Provider(s): [facility] Reason for Treatment: Depression, Anxiety, Alcohol Abuse Prior Outpatient Therapy: Prior Outpatient Therapy: No Does patient have an ACCT team?: No Does patient have Intensive In-House Services?  : No Does patient have [facility] services? : No Does patient have P4CC services?: No  Past Medical History:  Past Medical History:  Diagnosis Date  . Alcohol abuse   . Anxiety   . Asthma   . GERD (gastroesophageal reflux disease)   . Gout   . Hypertension   . Kidney stone   . OCD (obsessive compulsive disorder)   . Renal colic     Past Surgical History:  Procedure Laterality Date  . CHOLECYSTECTOMY  2012  . EXTRACORPOREAL SHOCK WAVE LITHOTRIPSY Left 01/12/2019   Procedure: EXTRACORPOREAL SHOCK WAVE LITHOTRIPSY (ESWL);  Surgeon: Billey Co, MD;  Location: [facility] ORS;  Service: Urology;  Laterality: Left;   Family History:  Family History  Problem Relation Age of Onset  . Alcohol abuse Father   . Breast cancer Mother 10   Family Psychiatric  History: unknown Social History:  Social History   Substance and Sexual Activity  Alcohol Use Yes  . Alcohol/week: 12.0 standard drinks  . Types: 12 Shots of liquor per week   Comment: pint daily     Social History   Substance and Sexual Activity  Drug Use No    Social History   Socioeconomic History  . Marital status: Married    Spouse name: Not on file  . Number of children: Not on file  . Years of education: Not on file  . Highest education level: Not on file  Occupational History  . Not on file  Tobacco Use  . Smoking status: Former Smoker    Types: Cigarettes  . Smokeless tobacco: Never Used  . Tobacco comment: quit 30 years ago  Substance and Sexual Activity  . Alcohol use: Yes    Alcohol/week: 12.0 standard drinks    Types: 12 Shots of liquor per week    Comment: pint daily  . Drug use: No  . Sexual activity: Not on file  Other Topics Concern  . Not on file  Social History Narrative    Lives at home with his wife and takes care of his wife. Independent at baseline      - Biggest strain is financial but doesn't think they could got get more help.      Patient expressed interest in possible financial assistance. Informed written consent obtained   Social Determinants of Health   Financial Resource Strain:   . Difficulty of Paying Living Expenses:   Food Insecurity:   . Worried About Charity fundraiser in the Last Year:   . Arboriculturist in the Last Year:   Transportation Needs:   . Film/video editor (Medical):   Marland Kitchen Lack of Transportation (Non-Medical):   Physical Activity:   . Days of Exercise per Week:   . Minutes of Exercise per Session:   Stress:   . Feeling of Stress :   Social Connections:   . Frequency of Communication with Friends and Family:   . Frequency of Social Gatherings with Friends and Family:   .  Attends Religious Services:   . Active Member of Clubs or Organizations:   . Attends Archivist Meetings:   Marland Kitchen Marital Status:    Additional Social History:    Allergies:   Allergies  Allergen Reactions  . Percocet [Oxycodone-Acetaminophen] Nausea And Vomiting    Labs:  Results for orders placed or performed during the hospital encounter of 03/16/20 (from the past 48 hour(s))  Acetaminophen level     Status: Abnormal   Collection Time: 03/16/20  5:14 PM  Result Value Ref Range   Acetaminophen (Tylenol), Serum <10 (L) 10 - 30 ug/mL    Comment: (NOTE) Therapeutic concentrations vary significantly. A range of 10-30 ug/mL  may be an effective concentration for many patients. However, some  are best treated at concentrations outside of this range. Acetaminophen concentrations >150 ug/mL at 4 hours after ingestion  and >50 ug/mL at 12 hours after ingestion are often associated with  toxic reactions. Performed at [facility], [address] [place], [place] [address]   Comprehensive metabolic panel     Status:  Abnormal   Collection Time: 03/16/20  5:14 PM  Result Value Ref Range   Sodium 140 135 - 145 mmol/L   Potassium 3.5 3.5 - 5.1 mmol/L   Chloride 106 98 - 111 mmol/L   CO2 25 22 - 32 mmol/L   Glucose, Bld 119 (H) 70 - 99 mg/dL    Comment: Glucose reference range applies only to samples taken after fasting for at least 8 hours.   BUN 13 6 - 20 mg/dL   Creatinine, Ser 1.00 0.61 - 1.24 mg/dL   Calcium 9.1 8.9 - 10.3 mg/dL   Total Protein 7.5 6.5 - 8.1 g/dL   Albumin 4.1 3.5 - 5.0 g/dL   AST 22 15 - 41 U/L   ALT 23 0 - 44 U/L   Alkaline Phosphatase 84 38 - 126 U/L   Total Bilirubin 0.8 0.3 - 1.2 mg/dL   GFR calc non Af Amer >60 >60 mL/min   GFR calc Af Amer >60 >60 mL/min   Anion gap 9 5 - 15    Comment: Performed at [facility], [address], [place], [place] [address]  Ethanol     Status: Abnormal   Collection Time: 03/16/20  5:14 PM  Result Value Ref Range   Alcohol, Ethyl (B) 230 (H) <10 mg/dL    Comment: (NOTE) Lowest detectable limit for serum alcohol is 10 mg/dL. For medical purposes only. Performed at [facility], [address] [place], [place] [id number]   Salicylate level     Status: Abnormal   Collection Time: 03/16/20  5:14 PM  Result Value Ref Range   Salicylate Lvl [id number] (L) 7.0 - 30.0 mg/dL    Comment: Performed at [facility], [address] [place], [place] [address]  CBC with Differential     Status: Abnormal   Collection Time: 03/16/20  5:14 PM  Result Value Ref Range   WBC 10.0 4.0 - 10.5 K/uL   RBC 4.45 4.22 - 5.81 MIL/uL   Hemoglobin 13.1 13.0 - 17.0 g/dL   HCT 41.1 39.0 - 52.0 %   MCV 92.4 80.0 - 100.0 fL   MCH 29.4 26.0 - 34.0 pg   MCHC 31.9 30.0 - 36.0 g/dL   RDW 16.0 (H) 11.5 - 15.5 %   Platelets 243 150 - 400 K/uL   nRBC 0.0 0.0 - 0.2 %   Neutrophils Relative % 72 %  Neutro Abs 7.3 1.7 - 7.7 K/uL   Lymphocytes Relative 17 %   Lymphs Abs 1.7 0.7 - 4.0 K/uL   Monocytes Relative 8 %   Monocytes Absolute 0.8  0.1 - 1.0 K/uL   Eosinophils Relative 1 %   Eosinophils Absolute 0.1 0.0 - 0.5 K/uL   Basophils Relative 1 %   Basophils Absolute 0.1 0.0 - 0.1 K/uL   Immature Granulocytes 1 %   Abs Immature Granulocytes 0.05 0.00 - 0.07 K/uL    Comment: Performed at [facility], [address], [place], [place] [address]  Urine Drug Screen, Qualitative     Status: Abnormal   Collection Time: 03/16/20  5:36 PM  Result Value Ref Range   Tricyclic, Ur Screen NONE DETECTED NONE DETECTED   Amphetamines, Ur Screen NONE DETECTED NONE DETECTED   MDMA (Ecstasy)Ur Screen NONE DETECTED NONE DETECTED   Cocaine Metabolite,Ur [place] NONE DETECTED NONE DETECTED   Opiate, Ur Screen NONE DETECTED NONE DETECTED   Phencyclidine (PCP) Ur S NONE DETECTED NONE DETECTED   Cannabinoid 50 Ng, Ur [place] POSITIVE (A) NONE DETECTED   Barbiturates, Ur Screen NONE DETECTED NONE DETECTED   Benzodiazepine, Ur Scrn POSITIVE (A) NONE DETECTED   Methadone Scn, Ur NONE DETECTED NONE DETECTED    Comment: (NOTE) Tricyclics + metabolites, urine    Cutoff 1000 ng/mL Amphetamines + metabolites, urine  Cutoff 1000 ng/mL MDMA (Ecstasy), urine              Cutoff 500 ng/mL Cocaine Metabolite, urine          Cutoff 300 ng/mL Opiate + metabolites, urine        Cutoff 300 ng/mL Phencyclidine (PCP), urine         Cutoff 25 ng/mL Cannabinoid, urine                 Cutoff 50 ng/mL Barbiturates + metabolites, urine  Cutoff 200 ng/mL Benzodiazepine, urine              Cutoff 200 ng/mL Methadone, urine                   Cutoff 300 ng/mL The urine drug screen provides only a preliminary, unconfirmed analytical test result and should not be used for non-medical purposes. Clinical consideration and professional judgment should be applied to any positive drug screen result due to possible interfering substances. A more specific alternate chemical method must be used in order to obtain a confirmed analytical result. Gas chromatography / mass  spectrometry (GC/MS) is the preferred confirmat ory method. Performed at [facility], [address] [place], [place] [address]   Respiratory Panel by RT PCR (Flu A&B, Covid) - Nasopharyngeal Swab     Status: None   Collection Time: 03/16/20  8:08 PM   Specimen: Nasopharyngeal Swab  Result Value Ref Range   SARS Coronavirus 2 by RT PCR NEGATIVE NEGATIVE    Comment: (NOTE) SARS-CoV-2 target nucleic acids are NOT DETECTED. The SARS-CoV-2 RNA is generally detectable in upper respiratoy specimens during the acute phase of infection. The lowest concentration of SARS-CoV-2 viral copies this assay can detect is 131 copies/mL. A negative result does not preclude SARS-Cov-2 infection and should not be used as the sole basis for treatment or other patient management decisions. A negative result may occur with  improper specimen collection/handling, submission of specimen other than nasopharyngeal swab, presence of viral mutation(s) within the areas targeted by this assay, and inadequate number of viral copies (<  131 copies/mL). A negative result must be combined with clinical observations, patient history, and epidemiological information. The expected result is Negative. Fact Sheet for Patients:  PinkCheek.be Fact Sheet for Healthcare Providers:  GravelBags.it This test is not yet ap proved or cleared by the [place] FDA and  has been authorized for detection and/or diagnosis of SARS-CoV-2 by FDA under an Emergency Use Authorization (EUA). This EUA will remain  in effect (meaning this test can be used) for the duration of the COVID-19 declaration under Section 564(b)(1) of the Act, 21 U.S.C. section 360bbb-3(b)(1), unless the authorization is terminated or revoked sooner.    Influenza A by PCR NEGATIVE NEGATIVE   Influenza B by PCR NEGATIVE NEGATIVE    Comment: (NOTE) The Xpert Xpress SARS-CoV-2/FLU/RSV assay is  intended as an aid in  the diagnosis of influenza from Nasopharyngeal swab specimens and  should not be used as a sole basis for treatment. Nasal washings and  aspirates are unacceptable for Xpert Xpress SARS-CoV-2/FLU/RSV  testing. Fact Sheet for Patients: PinkCheek.be Fact Sheet for Healthcare Providers: GravelBags.it This test is not yet approved or cleared by the [place] FDA and  has been authorized for detection and/or diagnosis of SARS-CoV-2 by  FDA under an Emergency Use Authorization (EUA). This EUA will remain  in effect (meaning this test can be used) for the duration of the  Covid-19 declaration under Section 564(b)(1) of the Act, 21  U.S.C. section 360bbb-3(b)(1), unless the authorization is  terminated or revoked. Performed at [facility], [address] [place], [place] [address]     Current Facility-Administered Medications  Medication Dose Route Frequency Provider Last Rate Last Admin  . ibuprofen (ADVIL) tablet 600 mg  600 mg Oral Q8H PRN Carrie Mew, MD      . LORazepam (ATIVAN) injection 0-4 mg  0-4 mg Intravenous Q6H Carrie Mew, MD       Or  . LORazepam (ATIVAN) tablet 0-4 mg  0-4 mg Oral Q6H Carrie Mew, MD   2 mg at 03/16/20 2040  . [START ON 03/19/2020] LORazepam (ATIVAN) injection 0-4 mg  0-4 mg Intravenous Q12H Carrie Mew, MD       Or  . Derrill Memo ON 03/19/2020] LORazepam (ATIVAN) tablet 0-4 mg  0-4 mg Oral Q12H Carrie Mew, MD      . thiamine tablet 100 mg  100 mg Oral Daily Carrie Mew, MD   100 mg at 03/16/20 2040   Or  . thiamine (B-1) injection 100 mg  100 mg Intravenous Daily Carrie Mew, MD       Current Outpatient Medications  Medication Sig Dispense Refill  . albuterol (VENTOLIN HFA) 108 (90 Base) MCG/ACT inhaler Inhale 2 puffs into the lungs every 6 (six) hours as needed.    Marland Kitchen allopurinol (ZYLOPRIM) 300 MG tablet Take 1 tablet (300 mg  total) by mouth daily. 30 tablet 1  . ascorbic acid (VITAMIN C) 500 MG tablet Take 1 tablet (500 mg total) by mouth daily. 30 tablet 1  . chlordiazePOXIDE (LIBRIUM) 25 MG capsule Day 1: take 50 mg (2 capsules) in AM, afternoon, and evening.  Day 2: take 50 mg (2 capsules) AM and PM.  Day 3: take 50 mg (2 capsules) in AM 12 capsule 0  . citalopram (CELEXA) 20 MG tablet Take 1 tablet (20 mg total) by mouth daily. 30 tablet 0  . cloNIDine (CATAPRES) 0.1 MG tablet Take 1 tablet (0.1 mg total) by mouth daily. 30 tablet 2  . colchicine 0.6  MG tablet Take 1 tablet (0.6 mg total) by mouth daily as needed. 30 tablet 1  . Fluticasone-Salmeterol (ADVAIR DISKUS) 100-50 MCG/DOSE AEPB Inhale 1 puff into the lungs 2 (two) times daily. [id number] each 0  . folic acid (FOLVITE) 1 MG tablet Take 1 tablet (1 mg total) by mouth daily. 60 tablet 0  . lisinopril (ZESTRIL) 40 MG tablet Take 1 tablet (40 mg total) by mouth daily. 30 tablet 1  . Multiple Vitamins-Iron (MULTIVITAMINS WITH IRON) TABS tablet Take 1 tablet by mouth daily. 30 tablet 1  . pantoprazole (PROTONIX) 40 MG tablet Take 1 tablet (40 mg total) by mouth daily. 90 tablet 0  . QUEtiapine (SEROQUEL) 50 MG tablet Take 1 tablet (50 mg total) by mouth at bedtime. 60 tablet 1    Musculoskeletal: Strength & Muscle Tone: within normal limits Gait & Station: unable to assess Patient leans: N/A  Psychiatric Specialty Exam: Physical Exam  Nursing note and vitals reviewed. Constitutional: He is oriented to person, place, and time. He appears well-developed and well-nourished.  HENT:  Head: Normocephalic and atraumatic.  Eyes: Pupils are equal, round, and reactive to light.  Respiratory: Effort normal and breath sounds normal.  Musculoskeletal:        General: Normal range of motion.     Cervical back: Normal range of motion.  Neurological: He is alert and oriented to person, place, and time.  Skin: Skin is warm and dry.  Psychiatric: Judgment and thought  content normal. His affect is labile. His speech is slurred. He is slowed and withdrawn. Cognition and memory are impaired.    Review of Systems  Psychiatric/Behavioral: Positive for self-injury. Negative for confusion, hallucinations and suicidal ideas.  All other systems reviewed and are negative.   Blood pressure 139/85, pulse 96, temperature 98.3 F (36.8 C), temperature source Oral, resp. rate 18, height 6' (1.829 m), weight 81 kg, SpO2 100 %.Body mass index is 24.22 kg/m.  General Appearance: Disheveled  Eye Contact:  Fair  Speech:  Pressured  Volume:  Normal  Mood:  Depressed  Affect:  Congruent  Thought Process:  Coherent and Descriptions of Associations: Intact  Orientation:  Full (Time, Place, and Person)  Thought Content:  Logical  Suicidal Thoughts:  No  Homicidal Thoughts:  No  Memory:  Immediate;   Fair  Judgement:  Fair  Insight:  Fair  Psychomotor Activity:  Tremor  Concentration:  Concentration: Fair  Recall:  [employer] of Knowledge:  Fair  Language:  Fair  Akathisia:  NA  Handed:  Right  AIMS (if indicated):     Assets:  Communication Skills Resilience  ADL's:  Intact  Cognition:  Impaired,  Mild  Sleep:        Treatment Plan Summary: Daily contact with patient to assess and evaluate symptoms and progress in treatment  Disposition: No evidence of imminent risk to self or others at present.   Patient does not meet criteria for psychiatric inpatient admission. Reassess in the am for psychiatric stability  Deloria Lair, NP 03/17/2020 12:58 AM

## 2020-03-17 NOTE — ED Notes (Signed)
Pt. Refused to receive his dinner tray with apple juice to drink, he stated that he wasn't hungry and I told him that I was going to save it for him if he wants it later.

## 2020-03-17 NOTE — BH Assessment (Signed)
Patient is to be admitted to [facility] by Psychiatric Nurse Practitioner Clayborn Heron.,.  Attending Physician will be Dr. Weber Cooks.   Patient has been assigned to room 316, by [facility] Charge Nurse Dementria.   ER staff is aware of the admission:  Dr. Corky Downs, ER MD   Amy B., Patient's Nurse   Marisue Brooklyn, Patient Access.

## 2020-03-17 NOTE — ED Notes (Signed)
Hourly rounding reveals patient awake in room. No complaints, stable, in no acute distress. Q15 minute rounds and monitoring via Security Cameras to continue. 

## 2020-03-17 NOTE — ED Notes (Addendum)
Pt informed that NP decided that he will need treatment and has recommended inpatient admission. Pt became tearful but did not say anything. Observed slamming his door and hitting it with his fist then sat on the bed.

## 2020-03-17 NOTE — ED Notes (Signed)
Pt. Came out of his room and asked for some water with ice and I asked him if he wanted his dinner tray and he said no. And stated that " He's going to die tonight".

## 2020-03-18 ENCOUNTER — Other Ambulatory Visit: Payer: Self-pay

## 2020-03-18 ENCOUNTER — Encounter: Payer: Self-pay | Admitting: Family

## 2020-03-18 DIAGNOSIS — F1020 Alcohol dependence, uncomplicated: Secondary | ICD-10-CM

## 2020-03-18 LAB — LIPID PANEL
Cholesterol: 150 mg/dL (ref 0–200)
HDL: 64 mg/dL (ref >40)
LDL Cholesterol: 59 mg/dL (ref 0–99)
Total CHOL/HDL Ratio: 2.3 RATIO
Triglycerides: 137 mg/dL (ref <150)
VLDL: 27 mg/dL (ref 0–40)

## 2020-03-18 LAB — TSH: TSH: 1.831 u[IU]/mL (ref 0.350–4.500)

## 2020-03-18 LAB — HEMOGLOBIN A1C
Hgb A1c MFr Bld: 5.3 % (ref 4.8–5.6)
Mean Plasma Glucose: 105.41 mg/dL

## 2020-03-18 NOTE — BHH Suicide Risk Assessment (Signed)
[place] INPATIENT:  Family/Significant Other Suicide Prevention Education  Suicide Prevention Education:  Patient Refusal for Family/Significant Other Suicide Prevention Education: The patient Miguel [name] has refused to provide written consent for family/significant other to be provided Family/Significant Other Suicide Prevention Education during admission and/or prior to discharge.  Physician notified.  [place] 03/18/2020, 11:31 AM

## 2020-03-18 NOTE — H&P (Signed)
Psychiatric Admission Assessment Adult  Patient Identification: ETTORE ERKKILA MRN:  [id number] Date of Evaluation:  03/18/2020 Chief Complaint:  MDD (major depressive disorder), recurrent severe, without psychosis ([place]) [F33.2] Principal Diagnosis: Alcohol use disorder, severe, dependence ([place]) Diagnosis:  Principal Problem:   Alcohol use disorder, severe, dependence ([place]) Active Problems:   Hypertension   Self-inflicted laceration of left wrist ([place])   MDD (major depressive disorder), recurrent severe, without psychosis ([place])  History of Present Illness: Patient seen and chart reviewed.  Patient known from previous encounters.  59 year old man with a long history of alcohol abuse and depression.  Came to the emergency room intoxicated with a blood alcohol level of 260.  He had cut himself on the left wrist.  He proceeded to cut himself superficially on the right wrist once he was here in the hospital.  It is documented that he was making suicidal statements when he first arrived.  On interview today patient denies any suicidal wish or intent.  He admits that he continues to drink heavily.  He estimates about a pint of vodka every day.  The physical complications of alcohol use and withdrawal are becoming more and more impairing to him.  He is no longer can drive safely limiting his ability to take care of his wife at home.  That remains the big overwhelming stressor as he is chronically sick wife for whom he is the primary caretaker.  Mood stays depressed negative down and nervous.  Denies hallucinations.  Denies homicidal ideation.  Has not been going for any outpatient follow-up treatment. Associated Signs/Symptoms: Depression Symptoms:  anhedonia, fatigue, feelings of worthlessness/guilt, difficulty concentrating, hopelessness, impaired memory, suicidal attempt, (Hypo) Manic Symptoms:  Impulsivity, Anxiety Symptoms:  Excessive Worry, Psychotic Symptoms:  None reported PTSD  Symptoms: Negative Total Time spent with patient: 1 hour  Past Psychiatric History: Patient has a past history of chronic depression but even more so alcohol abuse.  He has resisted treatment many times with a variety of excuses.  His alcohol withdrawal symptoms are getting worse.  He was here recently and was not having DTs at that time but does have a history of getting pretty sick and tremulous coming down.  Multiple episodes of wrist cutting and other suicide-like behaviors while drunk.  Is the patient at risk to self? Yes.    Has the patient been a risk to self in the past 6 months? Yes.    Has the patient been a risk to self within the distant past? Yes.    Is the patient a risk to others? No.  Has the patient been a risk to others in the past 6 months? No.  Has the patient been a risk to others within the distant past? No.   Prior Inpatient Therapy:   Prior Outpatient Therapy:    Alcohol Screening: 1. How often do you have a drink containing alcohol?: 4 or more times a week 2. How many drinks containing alcohol do you have on a typical day when you are drinking?: 3 or 4 3. How often do you have six or more drinks on one occasion?: Less than monthly AUDIT-C Score: 6 4. How often during the last year have you found that you were not able to stop drinking once you had started?: Never 5. How often during the last year have you failed to do what was normally expected from you becasue of drinking?: Never 6. How often during the last year have you needed a first drink in the  morning to get yourself going after a heavy drinking session?: Never 7. How often during the last year have you had a feeling of guilt of remorse after drinking?: Never 8. How often during the last year have you been unable to remember what happened the night before because you had been drinking?: Never 9. Have you or someone else been injured as a result of your drinking?: No 10. Has a relative or friend or a doctor or  another health worker been concerned about your drinking or suggested you cut down?: Yes, during the last year Alcohol Use Disorder Identification Test Final Score (AUDIT): 10 Alcohol Brief Interventions/Follow-up: Alcohol Education, Medication Offered/Prescribed, Continued Monitoring Substance Abuse History in the last 12 months:  Yes.   Consequences of Substance Abuse: Multiple suicide attempts Previous Psychotropic Medications: Yes  Psychological Evaluations: Yes  Past Medical History:  Past Medical History:  Diagnosis Date  . Alcohol abuse   . Anxiety   . Asthma   . GERD (gastroesophageal reflux disease)   . Gout   . Hypertension   . Kidney stone   . OCD (obsessive compulsive disorder)   . Renal colic     Past Surgical History:  Procedure Laterality Date  . CHOLECYSTECTOMY  2012  . EXTRACORPOREAL SHOCK WAVE LITHOTRIPSY Left 01/12/2019   Procedure: EXTRACORPOREAL SHOCK WAVE LITHOTRIPSY (ESWL);  Surgeon: Billey Co, MD;  Location: [facility] ORS;  Service: Urology;  Laterality: Left;   Family History:  Family History  Problem Relation Age of Onset  . Alcohol abuse Father   . Breast cancer Mother 25   Family Psychiatric  History: Family history of alcohol abuse Tobacco Screening:   Social History:  Social History   Substance and Sexual Activity  Alcohol Use Yes  . Alcohol/week: 12.0 standard drinks  . Types: 12 Shots of liquor per week   Comment: pint daily     Social History   Substance and Sexual Activity  Drug Use No    Additional Social History:                           Allergies:   Allergies  Allergen Reactions  . Percocet [Oxycodone-Acetaminophen] Nausea And Vomiting   Lab Results:  Results for orders placed or performed during the hospital encounter of 03/17/20 (from the past 48 hour(s))  Hemoglobin A1c     Status: None   Collection Time: 03/18/20  9:01 AM  Result Value Ref Range   Hgb A1c MFr Bld 5.3 4.8 - 5.6 %    Comment:  (NOTE) Pre diabetes:          5.7%-6.4% Diabetes:              >6.4% Glycemic control for   <7.0% adults with diabetes    Mean Plasma Glucose 105.41 mg/dL    Comment: Performed at [facility], [place] [address] [place], [place] [address]  Lipid panel     Status: None   Collection Time: 03/18/20  9:01 AM  Result Value Ref Range   Cholesterol 150 0 - 200 mg/dL   Triglycerides 137 <150 mg/dL   HDL 64 >40 mg/dL   Total CHOL/HDL Ratio 2.3 RATIO   VLDL 27 0 - 40 mg/dL   LDL Cholesterol 59 0 - 99 mg/dL    Comment:        Total Cholesterol/HDL:CHD Risk Coronary Heart Disease Risk Table  Men   Women  1/2 Average Risk   3.4   3.3  Average Risk       5.0   4.4  2 X Average Risk   9.6   7.1  3 X Average Risk  23.4   11.0        Use the calculated Patient Ratio above and the CHD Risk Table to determine the patient's CHD Risk.        ATP III CLASSIFICATION (LDL):  <100     mg/dL   Optimal  100-129  mg/dL   Near or Above                    Optimal  130-159  mg/dL   Borderline  160-189  mg/dL   High  >190     mg/dL   Very High Performed at [facility], [address] [place], [place] [address]   TSH     Status: None   Collection Time: 03/18/20  9:01 AM  Result Value Ref Range   TSH 1.831 0.350 - 4.500 uIU/mL    Comment: Performed by a 3rd Generation assay with a functional sensitivity of <=0.01 uIU/mL. Performed at [facility], [address] [place], [place] [address]     Blood Alcohol level:  Lab Results  Component Value Date   ETH 230 (H) 03/16/2020   ETH 124 (H) [id number]    Metabolic Disorder Labs:  Lab Results  Component Value Date   HGBA1C 5.3 03/18/2020   MPG 105.41 03/18/2020   MPG 100 03/12/2017   No results found for: PROLACTIN Lab Results  Component Value Date   CHOL 150 03/18/2020   TRIG 137 03/18/2020   HDL 64 03/18/2020   CHOLHDL 2.3 03/18/2020   VLDL 27 03/18/2020   LDLCALC 59 03/18/2020   LDLCALC  71 03/12/2017    Current Medications: Current Facility-Administered Medications  Medication Dose Route Frequency Provider Last Rate Last Admin  . allopurinol (ZYLOPRIM) tablet 300 mg  300 mg Oral Daily Suella Broad, FNP   300 mg at 03/18/20 [id number]  . alum & mag hydroxide-simeth (MAALOX/MYLANTA) 200-200-20 MG/5ML suspension 30 mL  30 mL Oral Q4H PRN Burt Ek, Gayland Curry, FNP      . ascorbic acid (VITAMIN C) tablet 500 mg  500 mg Oral Daily Suella Broad, FNP   500 mg at 03/18/20 0811  . citalopram (CELEXA) tablet 20 mg  20 mg Oral Daily Suella Broad, FNP   20 mg at 03/18/20 0811  . diphenhydrAMINE (BENADRYL) capsule 25 mg  25 mg Oral Q6H PRN Suella Broad, FNP      . folic acid (FOLVITE) tablet 1 mg  1 mg Oral Daily Suella Broad, FNP   1 mg at 03/18/20 0811  . ibuprofen (ADVIL) tablet 600 mg  600 mg Oral Q8H PRN Suella Broad, FNP   600 mg at 03/17/20 2359  . ibuprofen (ADVIL) tablet 600 mg  600 mg Oral Once Suella Broad, FNP   Stopped at 03/18/20 0134  . lisinopril (ZESTRIL) tablet 40 mg  40 mg Oral Daily Suella Broad, FNP   40 mg at 03/18/20 0811  . LORazepam (ATIVAN) injection 0-4 mg  0-4 mg Intravenous Q6H Starkes-Perry, Gayland Curry, FNP       Or  . LORazepam (ATIVAN) tablet 0-4 mg  0-4 mg Oral Q6H Suella Broad, FNP   2 mg at 03/18/20 1455  . [START ON  03/19/2020] LORazepam (ATIVAN) injection 0-4 mg  0-4 mg Intravenous Q12H Suella Broad, FNP       Or  . Derrill Memo ON 03/19/2020] LORazepam (ATIVAN) tablet 0-4 mg  0-4 mg Oral Q12H Starkes-Perry, Gayland Curry, FNP      . magnesium hydroxide (MILK OF MAGNESIA) suspension 30 mL  30 mL Oral Daily PRN Starkes-Perry, Gayland Curry, FNP      . multivitamins with iron tablet 1 tablet  1 tablet Oral Daily Suella Broad, FNP   1 tablet at 03/18/20 0811  . pantoprazole (PROTONIX) EC tablet 40 mg  40 mg Oral Daily Suella Broad, FNP   40 mg at 03/18/20 0811  .  QUEtiapine (SEROQUEL) tablet 50 mg  50 mg Oral QHS Suella Broad, FNP   50 mg at 03/17/20 2359  . thiamine tablet 100 mg  100 mg Oral Daily Suella Broad, FNP   100 mg at 03/18/20 [id number]   Or  . thiamine (B-1) injection 100 mg  100 mg Intravenous Daily Suella Broad, FNP       PTA Medications: Medications Prior to Admission  Medication Sig Dispense Refill Last Dose  . albuterol (VENTOLIN HFA) 108 (90 Base) MCG/ACT inhaler Inhale 2 puffs into the lungs every 6 (six) hours as needed.     Marland Kitchen allopurinol (ZYLOPRIM) 300 MG tablet Take 1 tablet (300 mg total) by mouth daily. 30 tablet 1   . ascorbic acid (VITAMIN C) 500 MG tablet Take 1 tablet (500 mg total) by mouth daily. 30 tablet 1   . chlordiazePOXIDE (LIBRIUM) 25 MG capsule Day 1: take 50 mg (2 capsules) in AM, afternoon, and evening.  Day 2: take 50 mg (2 capsules) AM and PM.  Day 3: take 50 mg (2 capsules) in AM 12 capsule 0   . citalopram (CELEXA) 20 MG tablet Take 1 tablet (20 mg total) by mouth daily. 30 tablet 0   . cloNIDine (CATAPRES) 0.1 MG tablet Take 1 tablet (0.1 mg total) by mouth daily. 30 tablet 2   . colchicine 0.6 MG tablet Take 1 tablet (0.6 mg total) by mouth daily as needed. 30 tablet 1   . Fluticasone-Salmeterol (ADVAIR DISKUS) 100-50 MCG/DOSE AEPB Inhale 1 puff into the lungs 2 (two) times daily. [id number] each 0   . folic acid (FOLVITE) 1 MG tablet Take 1 tablet (1 mg total) by mouth daily. 60 tablet 0   . lisinopril (ZESTRIL) 40 MG tablet Take 1 tablet (40 mg total) by mouth daily. 30 tablet 1   . Multiple Vitamins-Iron (MULTIVITAMINS WITH IRON) TABS tablet Take 1 tablet by mouth daily. 30 tablet 1   . pantoprazole (PROTONIX) 40 MG tablet Take 1 tablet (40 mg total) by mouth daily. 90 tablet 0   . QUEtiapine (SEROQUEL) 50 MG tablet Take 1 tablet (50 mg total) by mouth at bedtime. 60 tablet 1     Musculoskeletal: Strength & Muscle Tone: within normal limits Gait & Station: unsteady Patient leans:  N/A  Psychiatric Specialty Exam: Physical Exam  Nursing note and vitals reviewed. Constitutional: He appears well-developed and well-nourished.  HENT:  Head: Normocephalic and atraumatic.  Eyes: Pupils are equal, round, and reactive to light. Conjunctivae are normal.  Cardiovascular: Regular rhythm and normal heart sounds.  Respiratory: Effort normal.  GI: Soft.  Musculoskeletal:        General: Normal range of motion.     Cervical back: Normal range of motion.  Neurological: He is alert.  Skin: Skin  is warm and dry.  Psychiatric: His affect is blunt. His speech is delayed. He is slowed. Thought content is not paranoid. He expresses impulsivity. He expresses no homicidal and no suicidal ideation. He exhibits abnormal recent memory.    Review of Systems  Constitutional: Negative.   HENT: Negative.   Eyes: Negative.   Respiratory: Negative.   Cardiovascular: Negative.   Gastrointestinal: Negative.   Musculoskeletal: Negative.   Skin: Negative.   Neurological: Negative.   Psychiatric/Behavioral: Positive for behavioral problems, dysphoric mood and self-injury.    Blood pressure (!) 153/98, pulse 79, temperature 97.8 F (36.6 C), temperature source Oral, resp. rate 18, height 6' (1.829 m), weight 84.8 kg, SpO2 99 %.Body mass index is 25.36 kg/m.  General Appearance: Disheveled  Eye Contact:  Fair  Speech:  Garbled  Volume:  Decreased  Mood:  Anxious and Dysphoric  Affect:  Congruent  Thought Process:  Disorganized  Orientation:  Full (Time, Place, and Person)  Thought Content:  Illogical  Suicidal Thoughts:  No  Homicidal Thoughts:  No  Memory:  Immediate;   Fair Recent;   Poor Remote;   Fair  Judgement:  Impaired  Insight:  Shallow  Psychomotor Activity:  Decreased and Tremor  Concentration:  Concentration: Fair  Recall:  Poor  Fund of Knowledge:  Fair  Language:  Fair  Akathisia:  No  Handed:  Right  AIMS (if indicated):     Assets:  Desire for  Improvement Housing Resilience Social Support  ADL's:  Impaired  Cognition:  Impaired,  Mild  Sleep:  Number of Hours: 5.5    Treatment Plan Summary: Daily contact with patient to assess and evaluate symptoms and progress in treatment, Medication management and Plan Monitor for symptoms of alcohol withdrawal and possible DTs.  Continue withdrawal parameters.  Engage in individual and group therapy.  Meet with treatment team.  I spent some time today talking with him about how much he would benefit from getting involved with Alcoholics Anonymous.  Restart antidepressant although with the acknowledgment to him that it is unlikely to help if he continues to drink.  Observation Level/Precautions:  15 minute checks  Laboratory:  Chemistry Profile  Psychotherapy:    Medications:    Consultations:    Discharge Concerns:    Estimated LOS:  Other:     Physician Treatment Plan for Primary Diagnosis: Alcohol use disorder, severe, dependence ([place]) Long Term Goal(s): Improvement in symptoms so as ready for discharge  Short Term Goals: Ability to disclose and discuss suicidal ideas and Ability to demonstrate self-control will improve  Physician Treatment Plan for Secondary Diagnosis: Principal Problem:   Alcohol use disorder, severe, dependence ([place]) Active Problems:   Hypertension   Self-inflicted laceration of left wrist ([place])   MDD (major depressive disorder), recurrent severe, without psychosis ([place])  Long Term Goal(s): Improvement in symptoms so as ready for discharge  Short Term Goals: Ability to maintain clinical measurements within normal limits will improve and Compliance with prescribed medications will improve  I certify that inpatient services furnished can reasonably be expected to improve the patient's condition.    Alethia Berthold, MD 5/3/20213:20 PM

## 2020-03-18 NOTE — BHH Group Notes (Signed)
Balance In Life 03/18/2020 1PM  Type of Therapy/Topic:  Group Therapy:  Balance in Life  Participation Level:  Did Not Attend  Description of Group:   This group will address the concept of balance and how it feels and looks when one is unbalanced. Patients will be encouraged to process areas in their lives that are out of balance and identify reasons for remaining unbalanced. Facilitators will guide patients in utilizing problem-solving interventions to address and correct the stressor making their life unbalanced. Understanding and applying boundaries will be explored and addressed for obtaining and maintaining a balanced life. Patients will be encouraged to explore ways to assertively make their unbalanced needs known to significant others in their lives, using other group members and facilitator for support and feedback.  Therapeutic Goals: 1. Patient will identify two or more emotions or situations they have that consume much of in their lives. 2. Patient will identify signs/triggers that life has become out of balance:  3. Patient will identify two ways to set boundaries in order to achieve balance in their lives:  4. Patient will demonstrate ability to communicate their needs through discussion and/or role plays  Summary of Patient Progress:    Therapeutic Modalities:   Cognitive Behavioral Therapy Solution-Focused Therapy Assertiveness Training  Juma Oxley T Maira Christon, LCSW  

## 2020-03-18 NOTE — Plan of Care (Signed)
Patient verbalized minimal withdrawal symptoms but stated that his anxiety is high.Denies SI,HI and AVH at this time.Staying in bed with no motivation to do anything.Compliant with medication.Did not attend group.Appetite good.Energy level fair.Support and encouragement given.

## 2020-03-18 NOTE — BHH Suicide Risk Assessment (Signed)
[facility] Admission Suicide Risk Assessment   Nursing information obtained from:  Patient Demographic factors:  Male, Caucasian, Unemployed Current Mental Status:  Self-harm behaviors Loss Factors:  Decrease in vocational status Historical Factors:  Prior suicide attempts Risk Reduction Factors:  Sense of responsibility to family, Living with another person, especially a relative, Positive social support  Total Time spent with patient: 1 hour Principal Problem: Alcohol use disorder, severe, dependence ([place]) Diagnosis:  Principal Problem:   Alcohol use disorder, severe, dependence ([place]) Active Problems:   Hypertension   MDD (major depressive disorder), recurrent severe, without psychosis ([place])  Subjective Data: Patient seen chart reviewed.  Patient known from previous admissions.  He has been drinking heavily and came into the hospital intoxicated with cuts to his wrist.  Was making suicidal statements while drunk but now that he is sober denies any suicidal ideation.  He is a little bit confused and nervous and having some alcohol withdrawal symptoms.  Denies any wish to have any behavior problems right now.  Continued Clinical Symptoms:  Alcohol Use Disorder Identification Test Final Score (AUDIT): 10 The "Alcohol Use Disorders Identification Test", Guidelines for Use in Primary Care, Second Edition.  World Pharmacologist [facility]). Score between 0-7:  no or low risk or alcohol related problems. Score between 8-15:  moderate risk of alcohol related problems. Score between 16-19:  high risk of alcohol related problems. Score 20 or above:  warrants further diagnostic evaluation for alcohol dependence and treatment.   CLINICAL FACTORS:   Depression:   Anhedonia Alcohol/Substance Abuse/Dependencies   Musculoskeletal: Strength & Muscle Tone: decreased Gait & Station: unsteady Patient leans: N/A  Psychiatric Specialty Exam: Physical Exam  Nursing note and vitals  reviewed. Constitutional: He appears well-developed and well-nourished.  HENT:  Head: Normocephalic and atraumatic.  Eyes: Pupils are equal, round, and reactive to light. Conjunctivae are normal.  Cardiovascular: Regular rhythm and normal heart sounds.  Respiratory: Effort normal.  GI: Soft.  Musculoskeletal:        General: Normal range of motion.     Cervical back: Normal range of motion.  Neurological: He is alert. He displays tremor.  Skin: Skin is warm and dry.  Psychiatric: His affect is blunt. His speech is delayed. He is slowed. He expresses impulsivity. He expresses no homicidal and no suicidal ideation. He exhibits abnormal recent memory.    Review of Systems  Constitutional: Negative.   HENT: Negative.   Eyes: Negative.   Respiratory: Negative.   Cardiovascular: Negative.   Gastrointestinal: Negative.   Musculoskeletal: Negative.   Skin: Negative.   Neurological: Negative.   Psychiatric/Behavioral: Positive for behavioral problems, dysphoric mood and sleep disturbance. The patient is nervous/anxious.     Blood pressure (!) 153/98, pulse 79, temperature 97.8 F (36.6 C), temperature source Oral, resp. rate 18, height 6' (1.829 m), weight 84.8 kg, SpO2 99 %.Body mass index is 25.36 kg/m.  General Appearance: Disheveled  Eye Contact:  Fair  Speech:  Slow  Volume:  Decreased  Mood:  Dysphoric  Affect:  Constricted  Thought Process:  Goal Directed  Orientation:  Full (Time, Place, and Person)  Thought Content:  Rumination  Suicidal Thoughts:  No  Homicidal Thoughts:  No  Memory:  Immediate;   Fair Recent;   Poor Remote;   Poor  Judgement:  Impaired  Insight:  Shallow  Psychomotor Activity:  Decreased  Concentration:  Concentration: Poor  Recall:  Poor  Fund of Knowledge:  Poor  Language:  Poor  Akathisia:  No  Handed:  Right  AIMS (if indicated):     Assets:  Desire for Improvement Housing Resilience Social Support  ADL's:  Impaired  Cognition:   Impaired,  Mild  Sleep:  Number of Hours: 5.5      COGNITIVE FEATURES THAT CONTRIBUTE TO RISK:  Closed-mindedness    SUICIDE RISK:   Mild:  Suicidal ideation of limited frequency, intensity, duration, and specificity.  There are no identifiable plans, no associated intent, mild dysphoria and related symptoms, good self-control (both objective and subjective assessment), few other risk factors, and identifiable protective factors, including available and accessible social support.  PLAN OF CARE: Monitor for alcohol withdrawal.  Involved in individual and group therapy.  Ongoing assessment of dangerousness before arranging for appropriate discharge planning  I certify that inpatient services furnished can reasonably be expected to improve the patient's condition.   Alethia Berthold, MD 03/18/2020, 3:06 PM

## 2020-03-18 NOTE — Plan of Care (Signed)
Patient is newly admitted to the unit.   Problem: Safety: Goal: Periods of time without injury will increase Outcome: Not Progressing   Problem: Health Behavior/Discharge Planning: Goal: Identification of resources available to assist in meeting health care needs will improve Outcome: Not Progressing Goal: Compliance with treatment plan for underlying cause of condition will improve Outcome: Not Progressing   Problem: Coping: Goal: Ability to verbalize frustrations and anger appropriately will improve Outcome: Not Progressing Goal: Ability to demonstrate self-control will improve Outcome: Not Progressing   Problem: Activity: Goal: Interest or engagement in activities will improve Outcome: Not Progressing Goal: Sleeping patterns will improve Outcome: Not Progressing   Problem: Education: Goal: Knowledge of [facility] Education information/materials will improve Outcome: Not Progressing Goal: Emotional status will improve Outcome: Not Progressing Goal: Mental status will improve Outcome: Not Progressing Goal: Verbalization of understanding the information provided will improve Outcome: Not Progressing

## 2020-03-18 NOTE — [facility] Counselor (Signed)
Adult Comprehensive Assessment  Patient ID: Miguel [name], male   DOB: [birth year], 59 y.o.   MRN: [id number]  Information Source:   Information Source: Information source: Patient. Chart review, last seen at [facility] 07/28/2019   Current Stressors:  Patient states their primary concerns and needs for treatment are:: "Drinking, half pint a day". Patient states their goals for this hospitilization and ongoing recovery are:: "Quit drinking" Educational / Learning stressors: Pt denies. Employment / Job issues: Pt denies. Family Relationships: Pt reports that he is the caregiver for his wife and his family lives in [place] / Lack of resources (include bankruptcy): No income Housing / Lack of housing: Pt denies. Physical health (include injuries & life threatening diseases): None reported Social relationships: "No one comes over, I use to have friends" Substance abuse: Pt reports alcohol use. Bereavement / Loss: Pt reports that his father passed away last year.   Living/Environment/Situation:  Living Arrangements: Spouse/significant other Living conditions (as described by patient or guardian): Pt reports "they're good but I Miguel [name]'t go anywhere." Who else lives in the home?: Pt reports that he lives with his wife. How long has patient lived in current situation?: Pt reports "8 years". What is atmosphere in current home: Other (Comment)(Pt reports " stressful".)   Family History:  Marital status: Married Number of Years Married: 37 What types of issues is patient dealing with in the relationship?: Pt reports "his wife is handicap and he is her caretaker. Pt states " her money is her money, I Miguel [name]'t have any" Are you sexually active?: No What is your sexual orientation?: Heterosexual  Has your sexual activity been affected by drugs, alcohol, medication, or emotional stress?: Pt reports "we haven't had sex in 21 years because of her condition". Does patient have children?: No   Childhood  History:  By whom was/is the patient raised?: Both parents Additional childhood history information: Patient reports that his upbringing changed when his parents divorced when he was 34. Description of patient's relationship with caregiver when they were a child: Had a great relationship with both parents  Patient's description of current relationship with people who raised him/her: Pt reports that father is deceased.  Pt reports a good relationship with mother. How were you disciplined when you got in trouble as a child/adolescent?: Pt reports "no TV". Does patient have siblings?: Yes Number of Siblings: 2 Description of patient's current relationship with siblings: Two brothers who are younger - has a good relationship with siblings  Did patient suffer any verbal/emotional/physical/sexual abuse as a child?: No Did patient suffer from severe childhood neglect?: No Has patient ever been sexually abused/assaulted/raped as an adolescent or adult?: No Was the patient ever a victim of a crime or a disaster?: No Witnessed domestic violence?: No   Education:  Highest grade of school patient has completed: 11th grade Currently a student?: No Learning disability?: No   Employment/Work Situation:   Employment situation: Unemployed Patient's job has been impacted by current illness: No What is the longest time patient has a held a job?: 20 years  Where was the patient employed at that time?: [employer]  Did You Receive Any Psychiatric Treatment/Services While in Passenger transport manager?: No(NA) Are There Guns or Other Weapons in [place]?: No, pt denies Development worker, international aid Resources:   Financial resources: Support from parents / caregiver Does patient have a Programmer, applications or guardian?: No   Alcohol/Substance Abuse:   What has been your use of drugs/alcohol within the last  12 months?: Pt reports "1/2 pint a day". If attempted suicide, did drugs/alcohol play a role in this?:  No Alcohol/Substance Abuse Treatment Hx: Past Tx, Outpatient If yes, describe treatment: Pt reports past [facility] outpatient. He reports that he did not like the service. Has alcohol/substance abuse ever caused legal problems?: No   Social Support System:   Patient's Community Support System: "None" Describe Community Support System: N/A Type of faith/religion: [employer] How does patient's faith help to cope with current illness?: Pt denies.   Leisure/Recreation:   Leisure and Hobbies: Pension scheme manager, playing pool".   Strengths/Needs:   What is the patient's perception of their strengths?: Pt reports "automotive" Patient states these barriers may affect/interfere with their treatment: Pt denies. Patient states these barriers may affect their return to the community: Pt denies.   Discharge Plan:   Currently receiving community mental health services: No Patient states concerns and preferences for aftercare planning are: Pt reports he is unsure about residential or outpatient treatment. Pt says he would like some time before making a decision. Patient states they will know when they are safe and ready for discharge when: "To not drink at all" Does patient have access to transportation?: Yes Does patient have financial barriers related to discharge medications?: Yes, no insurance Will patient be returning to same living situation after discharge?: Yes     Summary/Recommendations:   Summary and Recommendations (to be completed by the evaluator): Pt is a 59 yr old male with a diagnosis of Alcohol use disorder, severe, dependence. Pt reports coming into the hospital due to alcoholism and denies suicidal ideation. Pt has a self inflicted wound on his left wrist but denies trying to harm himself. 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 Pt denies having a mental health provider and states he would like some time to think about receiving residential or outpatient treatment. Pt last seen at [facility] in  September 2020.  While here, patient will benefit from crisis stabilization, medication evaluation, group therapy and psychoeducation. In addition, it is recommended that patient remain compliant with the established discharge plan and continue treatment.  Blase Beckner Lynelle Smoke. 03/18/2020

## 2020-03-18 NOTE — Progress Notes (Signed)
Admission Note: Patient is a 59 yr old caucasian male. Patient admitted to [facility] under IVC. Tearful during interview he presented as sad and flat.  Admitted to drinking but down played the amount of alcohol he consumes. Reports stressors being that he has to take care of his wife and he feels isolated.  Reported that he used to go out and be active and since COVID he has had to stay in the house which has caused an increase in his alcohol consumption.   Denies SI/HI/AVH depression and anxiety during this encounter. He presents with slight tremors. He denies pain at this time.  Skin assessment conducted with Larene Beach, RN shows both arms have self inflicted wounds, with the left forearm being more pronounced.  Right arm was a recent injury acquired while in the hospital. Both arms have been treated and bandaged. Remainder of the skin assessment was unremarkable. Patient was calm and cooperative during the admission process.  No contraband found during assessment.  Patient signed consents and oriented to the unit. Patient remains safe at this time with 15 minute safety rounds.

## 2020-03-19 DIAGNOSIS — F1020 Alcohol dependence, uncomplicated: Secondary | ICD-10-CM | POA: Diagnosis not present

## 2020-03-19 MED ORDER — CHLORDIAZEPOXIDE HCL 25 MG PO CAPS
25.0000 mg | ORAL_CAPSULE | ORAL | Status: AC
  Administered 2020-03-19: 25 mg via ORAL
  Filled 2020-03-19: qty 1

## 2020-03-19 MED ORDER — THIAMINE HCL 100 MG/ML IJ SOLN: 100.0000 mg | Freq: Once | INTRAMUSCULAR | Status: DC

## 2020-03-19 MED ORDER — THIAMINE HCL 100 MG PO TABS
100.0000 mg | ORAL_TABLET | Freq: Every day | ORAL | Status: DC
Start: 2020-03-20 — End: 2020-03-19

## 2020-03-19 MED ORDER — CHLORDIAZEPOXIDE HCL 25 MG PO CAPS
25.0000 mg | ORAL_CAPSULE | Freq: Four times a day (QID) | ORAL | Status: DC | PRN
  Administered 2020-03-19 – 2020-03-20 (×2): 25 mg via ORAL
  Filled 2020-03-19 (×2): qty 1

## 2020-03-19 MED ORDER — ADULT MULTIVITAMIN W/MINERALS CH
1.0000 | ORAL_TABLET | Freq: Every day | ORAL | Status: DC
  Administered 2020-03-19 – 2020-03-21 (×3): 1 via ORAL
  Filled 2020-03-19 (×3): qty 1

## 2020-03-19 MED ORDER — HYDROXYZINE HCL 25 MG PO TABS
25.0000 mg | ORAL_TABLET | Freq: Four times a day (QID) | ORAL | Status: DC | PRN
  Administered 2020-03-19 – 2020-03-20 (×3): 25 mg via ORAL
  Filled 2020-03-19 (×3): qty 1

## 2020-03-19 MED ORDER — LOPERAMIDE HCL 2 MG PO CAPS: 2.0000 mg | ORAL_CAPSULE | ORAL | Status: DC | PRN

## 2020-03-19 MED ORDER — ONDANSETRON 4 MG PO TBDP: 4.0000 mg | ORAL_TABLET | Freq: Four times a day (QID) | ORAL | Status: DC | PRN

## 2020-03-19 NOTE — Tx Team (Signed)
Interdisciplinary Treatment and Diagnostic Plan Update  03/19/2020 Time of Session: 9am RAFIQ BUCKLIN MRN: [id number]  Principal Diagnosis: Alcohol use disorder, severe, dependence ([place])  Secondary Diagnoses: Principal Problem:   Alcohol use disorder, severe, dependence ([place]) Active Problems:   Hypertension   Self-inflicted laceration of left wrist ([place])   MDD (major depressive disorder), recurrent severe, without psychosis ([place])   Current Medications:  Current Facility-Administered Medications  Medication Dose Route Frequency Provider Last Rate Last Admin  . allopurinol (ZYLOPRIM) tablet 300 mg  300 mg Oral Daily Suella Broad, FNP   300 mg at 03/19/20 0814  . alum & mag hydroxide-simeth (MAALOX/MYLANTA) 200-200-20 MG/5ML suspension 30 mL  30 mL Oral Q4H PRN Burt Ek, Gayland Curry, FNP      . ascorbic acid (VITAMIN C) tablet 500 mg  500 mg Oral Daily Suella Broad, FNP   500 mg at 03/19/20 0817  . chlordiazePOXIDE (LIBRIUM) capsule 25 mg  25 mg Oral Q6H PRN Clapacs, John T, MD      . citalopram (CELEXA) tablet 20 mg  20 mg Oral Daily Suella Broad, FNP   20 mg at 03/19/20 0810  . diphenhydrAMINE (BENADRYL) capsule 25 mg  25 mg Oral Q6H PRN Suella Broad, FNP      . folic acid (FOLVITE) tablet 1 mg  1 mg Oral Daily Suella Broad, FNP   1 mg at 03/19/20 0809  . hydrOXYzine (ATARAX/VISTARIL) tablet 25 mg  25 mg Oral Q6H PRN Clapacs, John T, MD      . ibuprofen (ADVIL) tablet 600 mg  600 mg Oral Q8H PRN Suella Broad, FNP   600 mg at 03/17/20 2359  . ibuprofen (ADVIL) tablet 600 mg  600 mg Oral Once Suella Broad, FNP   Stopped at 03/18/20 0134  . lisinopril (ZESTRIL) tablet 40 mg  40 mg Oral Daily Suella Broad, FNP   40 mg at 03/19/20 0809  . loperamide (IMODIUM) capsule 2-4 mg  2-4 mg Oral PRN Clapacs, John T, MD      . magnesium hydroxide (MILK OF MAGNESIA) suspension 30 mL  30 mL Oral Daily PRN Starkes-Perry, Gayland Curry, FNP      . multivitamin with minerals tablet 1 tablet  1 tablet Oral Daily Clapacs, Madie Reno, MD   1 tablet at 03/19/20 1210  . ondansetron (ZOFRAN-ODT) disintegrating tablet 4 mg  4 mg Oral Q6H PRN Clapacs, John T, MD      . pantoprazole (PROTONIX) EC tablet 40 mg  40 mg Oral Daily Suella Broad, FNP   40 mg at 03/19/20 0809  . QUEtiapine (SEROQUEL) tablet 50 mg  50 mg Oral QHS Suella Broad, FNP   50 mg at 03/18/20 1955  . thiamine tablet 100 mg  100 mg Oral Daily Suella Broad, FNP   100 mg at 03/19/20 0809   PTA Medications: Medications Prior to Admission  Medication Sig Dispense Refill Last Dose  . albuterol (VENTOLIN HFA) 108 (90 Base) MCG/ACT inhaler Inhale 2 puffs into the lungs every 6 (six) hours as needed.     Marland Kitchen allopurinol (ZYLOPRIM) 300 MG tablet Take 1 tablet (300 mg total) by mouth daily. 30 tablet 1   . ascorbic acid (VITAMIN C) 500 MG tablet Take 1 tablet (500 mg total) by mouth daily. 30 tablet 1   . chlordiazePOXIDE (LIBRIUM) 25 MG capsule Day 1: take 50 mg (2 capsules) in AM, afternoon, and evening.  Day 2: take 50  mg (2 capsules) AM and PM.  Day 3: take 50 mg (2 capsules) in AM 12 capsule 0   . citalopram (CELEXA) 20 MG tablet Take 1 tablet (20 mg total) by mouth daily. 30 tablet 0   . cloNIDine (CATAPRES) 0.1 MG tablet Take 1 tablet (0.1 mg total) by mouth daily. 30 tablet 2   . colchicine 0.6 MG tablet Take 1 tablet (0.6 mg total) by mouth daily as needed. 30 tablet 1   . Fluticasone-Salmeterol (ADVAIR DISKUS) 100-50 MCG/DOSE AEPB Inhale 1 puff into the lungs 2 (two) times daily. 378 each 0   . folic acid (FOLVITE) 1 MG tablet Take 1 tablet (1 mg total) by mouth daily. 60 tablet 0   . lisinopril (ZESTRIL) 40 MG tablet Take 1 tablet (40 mg total) by mouth daily. 30 tablet 1   . Multiple Vitamins-Iron (MULTIVITAMINS WITH IRON) TABS tablet Take 1 tablet by mouth daily. 30 tablet 1   . pantoprazole (PROTONIX) 40 MG tablet Take 1 tablet (40 mg  total) by mouth daily. 90 tablet 0   . QUEtiapine (SEROQUEL) 50 MG tablet Take 1 tablet (50 mg total) by mouth at bedtime. 60 tablet 1     Patient Stressors:    Patient Strengths:    Treatment Modalities: Medication Management, Group therapy, Case management,  1 to 1 session with clinician, Psychoeducation, Recreational therapy.   Physician Treatment Plan for Primary Diagnosis: Alcohol use disorder, severe, dependence ([place]) Long Term Goal(s): Improvement in symptoms so as ready for discharge Improvement in symptoms so as ready for discharge   Short Term Goals: Ability to disclose and discuss suicidal ideas Ability to demonstrate self-control will improve Ability to maintain clinical measurements within normal limits will improve Compliance with prescribed medications will improve  Medication Management: Evaluate patient's response, side effects, and tolerance of medication regimen.  Therapeutic Interventions: 1 to 1 sessions, Unit Group sessions and Medication administration.  Evaluation of Outcomes: Not Met  Physician Treatment Plan for Secondary Diagnosis: Principal Problem:   Alcohol use disorder, severe, dependence ([place]) Active Problems:   Hypertension   Self-inflicted laceration of left wrist ([facility])   MDD (major depressive disorder), recurrent severe, without psychosis ([place])  Long Term Goal(s): Improvement in symptoms so as ready for discharge Improvement in symptoms so as ready for discharge   Short Term Goals: Ability to disclose and discuss suicidal ideas Ability to demonstrate self-control will improve Ability to maintain clinical measurements within normal limits will improve Compliance with prescribed medications will improve     Medication Management: Evaluate patient's response, side effects, and tolerance of medication regimen.  Therapeutic Interventions: 1 to 1 sessions, Unit Group sessions and Medication administration.  Evaluation of Outcomes: Not  Met   RN Treatment Plan for Primary Diagnosis: Alcohol use disorder, severe, dependence ([place]) Long Term Goal(s): Knowledge of disease and therapeutic regimen to maintain health will improve  Short Term Goals: Ability to demonstrate self-control, Ability to participate in decision making will improve, Ability to verbalize feelings will improve, Ability to disclose and discuss suicidal ideas, Ability to identify and develop effective coping behaviors will improve and Compliance with prescribed medications will improve  Medication Management: RN will administer medications as ordered by provider, will assess and evaluate patient's response and provide education to patient for prescribed medication. RN will report any adverse and/or side effects to prescribing provider.  Therapeutic Interventions: 1 on 1 counseling sessions, Psychoeducation, Medication administration, Evaluate responses to treatment, Monitor vital signs and CBGs as ordered, Perform/monitor CIWA,  COWS, AIMS and Fall Risk screenings as ordered, Perform wound care treatments as ordered.  Evaluation of Outcomes: Not Met   LCSW Treatment Plan for Primary Diagnosis: Alcohol use disorder, severe, dependence ([place]) Long Term Goal(s): Safe transition to appropriate next level of care at discharge, Engage patient in therapeutic group addressing interpersonal concerns.  Short Term Goals: Engage patient in aftercare planning with referrals and resources  Therapeutic Interventions: Assess for all discharge needs, 1 to 1 time with Social worker, Explore available resources and support systems, Assess for adequacy in community support network, Educate family and significant other(s) on suicide prevention, Complete Psychosocial Assessment, Interpersonal group therapy.  Evaluation of Outcomes: Not Met   Progress in Treatment: Attending groups: No. Participating in groups: No. Taking medication as prescribed: Yes. Toleration medication:  Yes. Family/Significant other contact made: Yes, individual(s) contacted:  pt declined Patient understands diagnosis: Yes. Discussing patient identified problems/goals with staff: Yes. Medical problems stabilized or resolved: No. Denies suicidal/homicidal ideation: Yes. Issues/concerns per patient self-inventory: No. Other: NA  New problem(s) identified: No, Describe:  None reported  New Short Term/Long Term Goal(s): Attend outpatient treatment, develop and implement healthy coping methods, take medication as prescribed  Patient Goals: "Quit drinking"   Discharge Plan or Barriers: Patient states he is unsure if he wants to receive residential or outpatient treatment. D/C plan TBD.  Reason for Continuation of Hospitalization: Medication stabilization  Estimated Length of Stay:1-7 days  Attendees: Patient:Trevaris Wasilewski 03/19/2020 2:38 PM  Physician: Alethia Berthold 03/19/2020 2:38 PM  Nursing: Alvis Lemmings, nurse 03/19/2020 2:38 PM  RN Care Manager: 03/19/2020 2:38 PM  Social Worker: Anise Salvo 03/19/2020 2:38 PM  Recreational Therapist:  03/19/2020 2:38 PM  Other:  03/19/2020 2:38 PM  Other:  03/19/2020 2:38 PM  Other: 03/19/2020 2:38 PM    Scribe for Treatment Team: Yvette Rack, LCSW 03/19/2020 2:38 PM

## 2020-03-19 NOTE — Plan of Care (Signed)
  Problem: Safety: Goal: Periods of time without injury will increase Outcome: Progressing   

## 2020-03-19 NOTE — Progress Notes (Addendum)
[facility] MD Progress Note  03/19/2020 11:38 AM Miguel [name]  MRN:  [id number] Subjective: Patient was seen and also attended treatment team.  59 year old man with alcohol abuse and depression.  He presents today looking like he is still having significant withdrawal symptoms.  He is tremulous and a little unsteady on his feet.  Mentally he appears to still be confused.  Not delirious but I suspect he is having more cognitive and memory problems related to alcohol abuse.  We talked with him for quite a while and treatment team about substance abuse treatment options and I am still not sure how well he understood.  He is not showing any dangerous or violent behavior. Principal Problem: Alcohol use disorder, severe, dependence ([place]) Diagnosis: Principal Problem:   Alcohol use disorder, severe, dependence ([place]) Active Problems:   Hypertension   Self-inflicted laceration of left wrist (HCC)   MDD (major depressive disorder), recurrent severe, without psychosis ([place])  Total Time spent with patient: 30 minutes  Past Psychiatric History: Past history of alcohol abuse depression and suicide attempts while intoxicated  Past Medical History:  Past Medical History:  Diagnosis Date  . Alcohol abuse   . Anxiety   . Asthma   . GERD (gastroesophageal reflux disease)   . Gout   . Hypertension   . Kidney stone   . OCD (obsessive compulsive disorder)   . Renal colic     Past Surgical History:  Procedure Laterality Date  . CHOLECYSTECTOMY  2012  . EXTRACORPOREAL SHOCK WAVE LITHOTRIPSY Left 01/12/2019   Procedure: EXTRACORPOREAL SHOCK WAVE LITHOTRIPSY (ESWL);  Surgeon: Billey Co, MD;  Location: [facility] ORS;  Service: Urology;  Laterality: Left;   Family History:  Family History  Problem Relation Age of Onset  . Alcohol abuse Father   . Breast cancer Mother 57   Family Psychiatric  History: See previous Social History:  Social History   Substance and Sexual Activity  Alcohol Use Yes  .  Alcohol/week: 12.0 standard drinks  . Types: 12 Shots of liquor per week   Comment: pint daily     Social History   Substance and Sexual Activity  Drug Use No    Social History   Socioeconomic History  . Marital status: Married    Spouse name: Not on file  . Number of children: Not on file  . Years of education: Not on file  . Highest education level: Not on file  Occupational History  . Not on file  Tobacco Use  . Smoking status: Former Smoker    Types: Cigarettes  . Smokeless tobacco: Never Used  . Tobacco comment: quit 30 years ago  Substance and Sexual Activity  . Alcohol use: Yes    Alcohol/week: 12.0 standard drinks    Types: 12 Shots of liquor per week    Comment: pint daily  . Drug use: No  . Sexual activity: Not on file  Other Topics Concern  . Not on file  Social History Narrative   Lives at home with his wife and takes care of his wife. Independent at baseline      - Biggest strain is financial but doesn't think they could got get more help.      Patient expressed interest in possible financial assistance. Informed written consent obtained   Social Determinants of Health   Financial Resource Strain:   . Difficulty of Paying Living Expenses:   Food Insecurity:   . Worried About Charity fundraiser in the Last  Year:   . Ran Out of Food in the Last Year:   Transportation Needs:   . Film/video editor (Medical):   Marland Kitchen Lack of Transportation (Non-Medical):   Physical Activity:   . Days of Exercise per Week:   . Minutes of Exercise per Session:   Stress:   . Feeling of Stress :   Social Connections:   . Frequency of Communication with Friends and Family:   . Frequency of Social Gatherings with Friends and Family:   . Attends Religious Services:   . Active Member of Clubs or Organizations:   . Attends Archivist Meetings:   Marland Kitchen Marital Status:    Additional Social History:                         Sleep: Fair  Appetite:   Poor  Current Medications: Current Facility-Administered Medications  Medication Dose Route Frequency Provider Last Rate Last Admin  . allopurinol (ZYLOPRIM) tablet 300 mg  300 mg Oral Daily Suella Broad, FNP   300 mg at 03/19/20 0814  . alum & mag hydroxide-simeth (MAALOX/MYLANTA) 200-200-20 MG/5ML suspension 30 mL  30 mL Oral Q4H PRN Burt Ek, Gayland Curry, FNP      . ascorbic acid (VITAMIN C) tablet 500 mg  500 mg Oral Daily Suella Broad, FNP   500 mg at 03/19/20 0817  . chlordiazePOXIDE (LIBRIUM) capsule 25 mg  25 mg Oral Q6H PRN Ajay Strubel T, MD      . citalopram (CELEXA) tablet 20 mg  20 mg Oral Daily Suella Broad, FNP   20 mg at 03/19/20 0810  . diphenhydrAMINE (BENADRYL) capsule 25 mg  25 mg Oral Q6H PRN Suella Broad, FNP      . folic acid (FOLVITE) tablet 1 mg  1 mg Oral Daily Suella Broad, FNP   1 mg at 03/19/20 0809  . hydrOXYzine (ATARAX/VISTARIL) tablet 25 mg  25 mg Oral Q6H PRN Nainika Newlun T, MD      . ibuprofen (ADVIL) tablet 600 mg  600 mg Oral Q8H PRN Suella Broad, FNP   600 mg at 03/17/20 2359  . ibuprofen (ADVIL) tablet 600 mg  600 mg Oral Once Suella Broad, FNP   Stopped at 03/18/20 0134  . lisinopril (ZESTRIL) tablet 40 mg  40 mg Oral Daily Suella Broad, FNP   40 mg at 03/19/20 0809  . loperamide (IMODIUM) capsule 2-4 mg  2-4 mg Oral PRN Keamber Macfadden T, MD      . magnesium hydroxide (MILK OF MAGNESIA) suspension 30 mL  30 mL Oral Daily PRN Starkes-Perry, Gayland Curry, FNP      . multivitamin with minerals tablet 1 tablet  1 tablet Oral Daily Taralynn Quiett T, MD      . ondansetron (ZOFRAN-ODT) disintegrating tablet 4 mg  4 mg Oral Q6H PRN Obie Kallenbach T, MD      . pantoprazole (PROTONIX) EC tablet 40 mg  40 mg Oral Daily Suella Broad, FNP   40 mg at 03/19/20 0809  . QUEtiapine (SEROQUEL) tablet 50 mg  50 mg Oral QHS Suella Broad, FNP   50 mg at 03/18/20 1955  . thiamine  tablet 100 mg  100 mg Oral Daily Suella Broad, FNP   100 mg at 03/19/20 [id number]    Lab Results:  Results for orders placed or performed during the hospital encounter of 03/17/20 (from the past 48 hour(s))  Hemoglobin A1c     Status: None   Collection Time: 03/18/20  9:01 AM  Result Value Ref Range   Hgb A1c MFr Bld 5.3 4.8 - 5.6 %    Comment: (NOTE) Pre diabetes:          5.7%-6.4% Diabetes:              >6.4% Glycemic control for   <7.0% adults with diabetes    Mean Plasma Glucose 105.41 mg/dL    Comment: Performed at [facility] [address] [place], [place] [address]  Lipid panel     Status: None   Collection Time: 03/18/20  9:01 AM  Result Value Ref Range   Cholesterol 150 0 - 200 mg/dL   Triglycerides 137 <150 mg/dL   HDL 64 >40 mg/dL   Total CHOL/HDL Ratio 2.3 RATIO   VLDL 27 0 - 40 mg/dL   LDL Cholesterol 59 0 - 99 mg/dL    Comment:        Total Cholesterol/HDL:CHD Risk Coronary Heart Disease Risk Table                     Men   Women  1/2 Average Risk   3.4   3.3  Average Risk       5.0   4.4  2 X Average Risk   9.6   7.1  3 X Average Risk  23.4   11.0        Use the calculated Patient Ratio above and the CHD Risk Table to determine the patient's CHD Risk.        ATP III CLASSIFICATION (LDL):  <100     mg/dL   Optimal  100-129  mg/dL   Near or Above                    Optimal  130-159  mg/dL   Borderline  160-189  mg/dL   High  >190     mg/dL   Very High Performed at [facility], [address] [place], [place] [address]   TSH     Status: None   Collection Time: 03/18/20  9:01 AM  Result Value Ref Range   TSH 1.831 0.350 - 4.500 uIU/mL    Comment: Performed by a 3rd Generation assay with a functional sensitivity of <=0.01 uIU/mL. Performed at [facility], [address] [place], [place] [address]     Blood Alcohol level:  Lab Results  Component Value Date   ETH 230 (H) 03/16/2020   ETH 124 (H) 123XX123     Metabolic Disorder Labs: Lab Results  Component Value Date   HGBA1C 5.3 03/18/2020   MPG 105.41 03/18/2020   MPG 100 03/12/2017   No results found for: PROLACTIN Lab Results  Component Value Date   CHOL 150 03/18/2020   TRIG 137 03/18/2020   HDL 64 03/18/2020   CHOLHDL 2.3 03/18/2020   VLDL 27 03/18/2020   LDLCALC 59 03/18/2020   LDLCALC 71 03/12/2017    Physical Findings: AIMS: Facial and Oral Movements Muscles of Facial Expression: None, normal Lips and Perioral Area: None, normal Jaw: None, normal Tongue: None, normal,Extremity Movements Upper (arms, wrists, hands, fingers): None, normal Lower (legs, knees, ankles, toes): None, normal, Trunk Movements Neck, shoulders, hips: None, normal, Overall Severity Severity of abnormal movements (highest score from questions above): None, normal Incapacitation due to abnormal movements: None, normal Patient's awareness of abnormal movements (rate only  patient's report): No Awareness, Dental Status Current problems with teeth and/or dentures?: No Does patient usually wear dentures?: No  CIWA:  CIWA-Ar Total: 8 COWS:     Musculoskeletal: Strength & Muscle Tone: within normal limits Gait & Station: unsteady Patient leans: N/A  Psychiatric Specialty Exam: Physical Exam  Nursing note and vitals reviewed. Constitutional: He appears well-developed and well-nourished.  HENT:  Head: Normocephalic and atraumatic.  Eyes: Pupils are equal, round, and reactive to light. Conjunctivae are normal.  Cardiovascular: Regular rhythm and normal heart sounds.  Respiratory: Effort normal. No respiratory distress.  GI: Soft.  Musculoskeletal:        General: Normal range of motion.     Cervical back: Normal range of motion.  Neurological: He is alert.  Skin: Skin is warm and dry.  Psychiatric: His mood appears anxious. His speech is delayed and slurred. He is slowed. Thought content is not paranoid. He expresses inappropriate judgment.  He expresses no homicidal and no suicidal ideation. He exhibits abnormal recent memory.    Review of Systems  Constitutional: Negative.   HENT: Negative.   Eyes: Negative.   Respiratory: Negative.   Cardiovascular: Negative.   Gastrointestinal: Negative.   Musculoskeletal: Negative.   Skin: Negative.   Neurological: Positive for tremors.  Psychiatric/Behavioral: Positive for confusion and dysphoric mood.    Blood pressure (!) 139/96, pulse 89, temperature 97.7 F (36.5 C), temperature source Oral, resp. rate 18, height 6' (1.829 m), weight 84.8 kg, SpO2 99 %.Body mass index is 25.36 kg/m.  General Appearance: Disheveled  Eye Contact:  Fair  Speech:  Slow and Slurred  Volume:  Decreased  Mood:  Dysphoric  Affect:  Congruent  Thought Process:  Disorganized  Orientation:  Full (Time, Place, and Person)  Thought Content:  Rumination and Tangential  Suicidal Thoughts:  No  Homicidal Thoughts:  No  Memory:  Immediate;   Fair Recent;   Poor Remote;   Poor  Judgement:  Impaired  Insight:  Shallow  Psychomotor Activity:  Restlessness  Concentration:  Concentration: Poor  Recall:  [place] of Knowledge:  Fair  Language:  Fair  Akathisia:  No  Handed:  Right  AIMS (if indicated):     Assets:  Desire for Improvement Housing Resilience  ADL's:  Impaired  Cognition:  Impaired,  Mild  Sleep:  Number of Hours: 7.45     Treatment Plan Summary: Daily contact with patient to assess and evaluate symptoms and progress in treatment, Medication management and Plan He is still having withdrawal.  I have discontinued the previous detox orders and substituted Librium along with an extra dose right now.  We tried to talk with him about the importance of outpatient treatment and go over options but he remains convinced that he will simply stop drinking when he gets out of the hospital and that he has to go back home to take care of his wife.  We tried to get him to think of options for how  that could be managed while still getting him the treatment that he needed but I think his cognitive problems are getting worse.  Continue current medicines otherwise.  Refer to outpatient treatment at discharge.  Have him go to groups and have daily individual and group assessment while in the hospital.  Alethia Berthold, MD 03/19/2020, 11:38 AM

## 2020-03-19 NOTE — Plan of Care (Signed)
  Problem: Safety: Goal: Periods of time without injury will increase 03/19/2020 0309 by Jacinto Halim, RN Outcome: Progressing 03/19/2020 0308 by Jacinto Halim, RN Outcome: Progressing

## 2020-03-19 NOTE — Plan of Care (Signed)
The patient accepting of care.  Problem: Coping: Goal: Ability to demonstrate self-control will improve Outcome: Progressing   Problem: Health Behavior/Discharge Planning: Goal: Identification of resources available to assist in meeting health care needs will improve Outcome: Progressing

## 2020-03-19 NOTE — [facility] Group Notes (Signed)
[facility] Group Notes:  (Nursing/MHT/Case Management/Adjunct)  Date:  03/19/2020  Time:  12:51 AM  Type of Therapy:  Group Therapy  Participation Level:  Did Not Attend    Miguel [name] 03/19/2020, 12:51 AM

## 2020-03-19 NOTE — Progress Notes (Signed)
F - Assist with ETOH Withdrawal  D - The patient accepted his medications as ordered and was provided Librium for symptoms of withdrawal  CIWA's improved as the day progressed.  Neeraj attended groups and remained visible in the milieu.  No significant needs expressed by the patient.  Tremor evident.  A - Medications accepted as ordered.  Support provided when appropriate.   R - Continue with care.

## 2020-03-19 NOTE — Progress Notes (Signed)
Took over care of patient at 11pm. Pt in bed resting eyes closed in no distress.

## 2020-03-19 NOTE — [facility] Group Notes (Signed)
LCSW Group Therapy Note  03/19/2020 1:00 PM  Type of Therapy/Topic:  Group Therapy:  Feelings about Diagnosis  Participation Level:  Did Not Attend   Description of Group:   This group will allow patients to explore their thoughts and feelings about diagnoses they have received. Patients will be guided to explore their level of understanding and acceptance of these diagnoses. Facilitator will encourage patients to process their thoughts and feelings about the reactions of others to their diagnosis and will guide patients in identifying ways to discuss their diagnosis with significant others in their lives. This group will be process-oriented, with patients participating in exploration of their own experiences, giving and receiving support, and processing challenge from other group members.   Therapeutic Goals: 1. Patient will demonstrate understanding of diagnosis as evidenced by identifying two or more symptoms of the disorder 2. Patient will be able to express two feelings regarding the diagnosis 3. Patient will demonstrate their ability to communicate their needs through discussion and/or role play  Summary of Patient Progress: X  Therapeutic Modalities:   Cognitive Behavioral Therapy Brief Therapy Feelings Identification   Waddell Iten, MSW, LCSW 03/19/2020 12:47 PM 

## 2020-03-20 DIAGNOSIS — F1020 Alcohol dependence, uncomplicated: Secondary | ICD-10-CM | POA: Diagnosis not present

## 2020-03-20 MED ORDER — MOMETASONE FURO-FORMOTEROL FUM 200-5 MCG/ACT IN AERO
2.0000 | INHALATION_SPRAY | Freq: Two times a day (BID) | RESPIRATORY_TRACT | Status: DC
  Administered 2020-03-20 – 2020-03-21 (×2): 2 via RESPIRATORY_TRACT
  Filled 2020-03-20: qty 8.8

## 2020-03-20 MED ORDER — ALBUTEROL SULFATE HFA 108 (90 BASE) MCG/ACT IN AERS
2.0000 | INHALATION_SPRAY | Freq: Four times a day (QID) | RESPIRATORY_TRACT | Status: DC | PRN
  Filled 2020-03-20: qty 6.7

## 2020-03-20 NOTE — Plan of Care (Signed)
Patient denies SI / HI / AVH. Patient reports mild anxiety. Patient reports decreased sleep and sleep quality last night. Patient denies any pain. Discussed elevated B/P with patient reports that "its been a problem." Patient's safety is maintained on the unit. Patient is adherent with scheduled medications.    Problem: Education: Goal: Knowledge of [place] [facility] Education information/materials will improve Outcome: Progressing Goal: Emotional status will improve Outcome: Progressing Goal: Mental status will improve Outcome: Progressing

## 2020-03-20 NOTE — Progress Notes (Signed)
[facility] MD Progress Note  03/20/2020 4:37 PM Miguel [name]  MRN:  [id number] Subjective: Follow-up for this patient with alcohol abuse and withdrawal.  Patient came to talk with me today.  He continues to be mildly tremulous and also a little bit confused.  Conversations with him are still a little hard to follow at times.  He wanders off of topic and I suspect he is having some short-term memory issues.  Still does not want to go to inpatient substance abuse treatment.  We talked about Alcoholics Anonymous and I printed out a list of all of the meetings with the timetable for all of the county is in the area and explained to him how to use it. Principal Problem: Alcohol use disorder, severe, dependence ([place]) Diagnosis: Principal Problem:   Alcohol use disorder, severe, dependence ([place]) Active Problems:   Hypertension   Self-inflicted laceration of left wrist ([facility])   MDD (major depressive disorder), recurrent severe, without psychosis ([place])  Total Time spent with patient: 30 minutes  Past Psychiatric History: Past history of alcohol abuse and depression  Past Medical History:  Past Medical History:  Diagnosis Date  . Alcohol abuse   . Anxiety   . Asthma   . GERD (gastroesophageal reflux disease)   . Gout   . Hypertension   . Kidney stone   . OCD (obsessive compulsive disorder)   . Renal colic     Past Surgical History:  Procedure Laterality Date  . CHOLECYSTECTOMY  2012  . EXTRACORPOREAL SHOCK WAVE LITHOTRIPSY Left 01/12/2019   Procedure: EXTRACORPOREAL SHOCK WAVE LITHOTRIPSY (ESWL);  Surgeon: Billey Co, MD;  Location: [facility] ORS;  Service: Urology;  Laterality: Left;   Family History:  Family History  Problem Relation Age of Onset  . Alcohol abuse Father   . Breast cancer Mother 81   Family Psychiatric  History: See previous Social History:  Social History   Substance and Sexual Activity  Alcohol Use Yes  . Alcohol/week: 12.0 standard drinks  . Types: 12 Shots of liquor  per week   Comment: pint daily     Social History   Substance and Sexual Activity  Drug Use No    Social History   Socioeconomic History  . Marital status: Married    Spouse name: Not on file  . Number of children: Not on file  . Years of education: Not on file  . Highest education level: Not on file  Occupational History  . Not on file  Tobacco Use  . Smoking status: Former Smoker    Types: Cigarettes  . Smokeless tobacco: Never Used  . Tobacco comment: quit 30 years ago  Substance and Sexual Activity  . Alcohol use: Yes    Alcohol/week: 12.0 standard drinks    Types: 12 Shots of liquor per week    Comment: pint daily  . Drug use: No  . Sexual activity: Not on file  Other Topics Concern  . Not on file  Social History Narrative   Lives at home with his wife and takes care of his wife. Independent at baseline      - Biggest strain is financial but doesn't think they could got get more help.      Patient expressed interest in possible financial assistance. Informed written consent obtained   Social Determinants of Health   Financial Resource Strain:   . Difficulty of Paying Living Expenses:   Food Insecurity:   . Worried About Charity fundraiser in the  Last Year:   . [place] in the Last Year:   Transportation Needs:   . Film/video editor (Medical):   Marland Kitchen Lack of Transportation (Non-Medical):   Physical Activity:   . Days of Exercise per Week:   . Minutes of Exercise per Session:   Stress:   . Feeling of Stress :   Social Connections:   . Frequency of Communication with Friends and Family:   . Frequency of Social Gatherings with Friends and Family:   . Attends Religious Services:   . Active Member of Clubs or Organizations:   . Attends Archivist Meetings:   Marland Kitchen Marital Status:    Additional Social History:                         Sleep: Fair  Appetite:  Fair  Current Medications: Current Facility-Administered  Medications  Medication Dose Route Frequency Provider Last Rate Last Admin  . albuterol (VENTOLIN HFA) 108 (90 Base) MCG/ACT inhaler 2 puff  2 puff Inhalation Q6H PRN Joseph Bias T, MD      . allopurinol (ZYLOPRIM) tablet 300 mg  300 mg Oral Daily Suella Broad, FNP   300 mg at 03/20/20 0825  . alum & mag hydroxide-simeth (MAALOX/MYLANTA) 200-200-20 MG/5ML suspension 30 mL  30 mL Oral Q4H PRN Burt Ek, Gayland Curry, FNP      . ascorbic acid (VITAMIN C) tablet 500 mg  500 mg Oral Daily Suella Broad, FNP   500 mg at 03/20/20 0825  . chlordiazePOXIDE (LIBRIUM) capsule 25 mg  25 mg Oral Q6H PRN Hudsyn Champine, Madie Reno, MD   25 mg at 03/20/20 1048  . citalopram (CELEXA) tablet 20 mg  20 mg Oral Daily Suella Broad, FNP   20 mg at 03/20/20 0826  . diphenhydrAMINE (BENADRYL) capsule 25 mg  25 mg Oral Q6H PRN Suella Broad, FNP   25 mg at 0000000 [id number]  . folic acid (FOLVITE) tablet 1 mg  1 mg Oral Daily Suella Broad, FNP   1 mg at 03/20/20 0825  . hydrOXYzine (ATARAX/VISTARIL) tablet 25 mg  25 mg Oral Q6H PRN Deasiah Hagberg, Madie Reno, MD   25 mg at 03/20/20 1048  . ibuprofen (ADVIL) tablet 600 mg  600 mg Oral Q8H PRN Suella Broad, FNP   600 mg at 03/17/20 2359  . ibuprofen (ADVIL) tablet 600 mg  600 mg Oral Once Suella Broad, FNP   Stopped at 03/18/20 0134  . lisinopril (ZESTRIL) tablet 40 mg  40 mg Oral Daily Suella Broad, FNP   40 mg at 03/20/20 0826  . loperamide (IMODIUM) capsule 2-4 mg  2-4 mg Oral PRN Chryl Holten T, MD      . magnesium hydroxide (MILK OF MAGNESIA) suspension 30 mL  30 mL Oral Daily PRN Starkes-Perry, Gayland Curry, FNP      . mometasone-formoterol (DULERA) 200-5 MCG/ACT inhaler 2 puff  2 puff Inhalation BID Iasha Mccalister, Madie Reno, MD   2 puff at 03/20/20 1608  . multivitamin with minerals tablet 1 tablet  1 tablet Oral Daily Gunhild Bautch, Madie Reno, MD   1 tablet at 03/20/20 [phone]  . ondansetron (ZOFRAN-ODT) disintegrating tablet 4 mg  4 mg  Oral Q6H PRN Basel Defalco T, MD      . pantoprazole (PROTONIX) EC tablet 40 mg  40 mg Oral Daily Suella Broad, FNP   40 mg at 03/20/20 0825  . QUEtiapine (SEROQUEL) tablet  50 mg  50 mg Oral QHS Suella Broad, FNP   50 mg at 03/19/20 2105  . thiamine tablet 100 mg  100 mg Oral Daily Suella Broad, FNP   100 mg at 03/20/20 [id number]    Lab Results: No results found for this or any previous visit (from the past 48 hour(s)).  Blood Alcohol level:  Lab Results  Component Value Date   ETH 230 (H) 03/16/2020   ETH 124 (H) [id number]    Metabolic Disorder Labs: Lab Results  Component Value Date   HGBA1C 5.3 03/18/2020   MPG 105.41 03/18/2020   MPG 100 03/12/2017   No results found for: PROLACTIN Lab Results  Component Value Date   CHOL 150 03/18/2020   TRIG 137 03/18/2020   HDL 64 03/18/2020   CHOLHDL 2.3 03/18/2020   VLDL 27 03/18/2020   LDLCALC 59 03/18/2020   LDLCALC 71 03/12/2017    Physical Findings: AIMS: Facial and Oral Movements Muscles of Facial Expression: None, normal Lips and Perioral Area: None, normal Jaw: None, normal Tongue: None, normal,Extremity Movements Upper (arms, wrists, hands, fingers): None, normal Lower (legs, knees, ankles, toes): None, normal, Trunk Movements Neck, shoulders, hips: None, normal, Overall Severity Severity of abnormal movements (highest score from questions above): None, normal Incapacitation due to abnormal movements: None, normal Patient's awareness of abnormal movements (rate only patient's report): No Awareness, Dental Status Current problems with teeth and/or dentures?: No Does patient usually wear dentures?: No  CIWA:  CIWA-Ar Total: 12 COWS:     Musculoskeletal: Strength & Muscle Tone: within normal limits Gait & Station: normal Patient leans: N/A  Psychiatric Specialty Exam: Physical Exam  Nursing note and vitals reviewed. Constitutional: He appears well-developed and well-nourished.  HENT:   Head: Normocephalic and atraumatic.  Eyes: Pupils are equal, round, and reactive to light. Conjunctivae are normal.  Cardiovascular: Regular rhythm and normal heart sounds.  Respiratory: Effort normal. No respiratory distress.  GI: Soft.  Musculoskeletal:        General: Normal range of motion.     Cervical back: Normal range of motion.  Neurological: He is alert.  Skin: Skin is warm and dry.  Psychiatric: Judgment normal. His affect is blunt. His speech is delayed and tangential. He is slowed. Cognition and memory are impaired. He expresses no homicidal and no suicidal ideation.    Review of Systems  Constitutional: Negative.   HENT: Negative.   Eyes: Negative.   Respiratory: Negative.   Cardiovascular: Negative.   Gastrointestinal: Negative.   Musculoskeletal: Negative.   Skin: Negative.   Neurological: Negative.   Psychiatric/Behavioral: The patient is nervous/anxious.     Blood pressure (!) 166/102, pulse (!) 102, temperature 98.2 F (36.8 C), temperature source Oral, resp. rate 17, height 6' (1.829 m), weight 84.8 kg, SpO2 100 %.Body mass index is 25.36 kg/m.  General Appearance: Casual  Eye Contact:  Fair  Speech:  Slow  Volume:  Decreased  Mood:  Euthymic  Affect:  Constricted  Thought Process:  Disorganized  Orientation:  Full (Time, Place, and Person)  Thought Content:  Rumination  Suicidal Thoughts:  No  Homicidal Thoughts:  No  Memory:  Immediate;   Fair Recent;   Poor Remote;   Fair  Judgement:  Impaired  Insight:  Shallow  Psychomotor Activity:  Decreased  Concentration:  Concentration: Poor  Recall:  Poor  Fund of Knowledge:  Fair  Language:  Fair  Akathisia:  No  Handed:  Right  AIMS (if indicated):  Assets:  Desire for Improvement  ADL's:  Impaired  Cognition:  Impaired,  Mild  Sleep:  Number of Hours: 6.5     Treatment Plan Summary: Daily contact with patient to assess and evaluate symptoms and progress in treatment, Medication  management and Plan Spent some time reviewing all of the risks of his ongoing alcohol abuse especially to his own health.  As I noted above I reviewed a timetable of AA meetings and explained what the procedure was for attending 1.  No change to current medicine.  We may be looking at discharge 1 to 2 days.  Alethia Berthold, MD 03/20/2020, 4:37 PM

## 2020-03-20 NOTE — Plan of Care (Signed)
  Problem: Education: Goal: Emotional status will improve Outcome: Progressing Goal: Mental status will improve Outcome: Progressing   Problem: Coping: Goal: Ability to verbalize frustrations and anger appropriately will improve Outcome: Progressing   Problem: Education: Goal: Verbalization of understanding the information provided will improve Outcome: Not Progressing   Problem: Activity: Goal: Interest or engagement in activities will improve Outcome: Not Progressing   Problem: Coping: Goal: Ability to demonstrate self-control will improve Outcome: Not Progressing

## 2020-03-20 NOTE — Progress Notes (Signed)
Patient continues to endorse depression, but denies SI/HI/AVH and anxiety. Received medication and tolerated without incident. Patient remains safe at this time with 15 minute safety checks. Patient informed to contact staff with questions or concerns.

## 2020-03-20 NOTE — BHH Group Notes (Signed)
Feelings Around Diagnosis 03/20/2020 1PM  Type of Therapy/Topic:  Group Therapy:  Feelings about Diagnosis  Participation Level:  Did Not Attend   Description of Group:   This group will allow patients to explore their thoughts and feelings about diagnoses they have received. Patients will be guided to explore their level of understanding and acceptance of these diagnoses. Facilitator will encourage patients to process their thoughts and feelings about the reactions of others to their diagnosis and will guide patients in identifying ways to discuss their diagnosis with significant others in their lives. This group will be process-oriented, with patients participating in exploration of their own experiences, giving and receiving support, and processing challenge from other group members.   Therapeutic Goals: 1. Patient will demonstrate understanding of diagnosis as evidenced by identifying two or more symptoms of the disorder 2. Patient will be able to express two feelings regarding the diagnosis 3. Patient will demonstrate their ability to communicate their needs through discussion and/or role play  Summary of Patient Progress:       Therapeutic Modalities:   Cognitive Behavioral Therapy Brief Therapy Feelings Identification    Yvette Rack, LCSW 03/20/2020 2:50 PM

## 2020-03-20 NOTE — BHH Group Notes (Signed)
[facility] Group Notes:  (Nursing/MHT/Case Management/Adjunct)  Date:  03/20/2020  Time:  9:39 AM  Type of Therapy:  Community Meeting  Participation Level:  Did Not Attend   Miguel [name] [facility] 03/20/2020, 9:39 AM

## 2020-03-21 DIAGNOSIS — F1020 Alcohol dependence, uncomplicated: Secondary | ICD-10-CM | POA: Diagnosis not present

## 2020-03-21 MED ORDER — CITALOPRAM HYDROBROMIDE 20 MG PO TABS: 20.0000 mg | ORAL_TABLET | Freq: Every day | ORAL | 1 refills | Status: DC

## 2020-03-21 MED ORDER — QUETIAPINE FUMARATE 50 MG PO TABS: 50.0000 mg | ORAL_TABLET | Freq: Every day | ORAL | 1 refills | Status: DC

## 2020-03-21 MED ORDER — QUETIAPINE FUMARATE 50 MG PO TABS: 50.0000 mg | ORAL_TABLET | Freq: Every day | ORAL | 0 refills | Status: DC

## 2020-03-21 MED ORDER — LISINOPRIL 40 MG PO TABS: 40.0000 mg | ORAL_TABLET | Freq: Every day | ORAL | 1 refills | Status: DC

## 2020-03-21 MED ORDER — LISINOPRIL 40 MG PO TABS: 40.0000 mg | ORAL_TABLET | Freq: Every day | ORAL | 0 refills | Status: DC

## 2020-03-21 MED ORDER — MOMETASONE FURO-FORMOTEROL FUM 200-5 MCG/ACT IN AERO: 2.0000 | INHALATION_SPRAY | Freq: Two times a day (BID) | RESPIRATORY_TRACT | 0 refills | Status: DC

## 2020-03-21 MED ORDER — ALLOPURINOL 300 MG PO TABS: 300.0000 mg | ORAL_TABLET | Freq: Every day | ORAL | 1 refills | Status: DC

## 2020-03-21 MED ORDER — THIAMINE HCL 100 MG PO TABS: 100.0000 mg | ORAL_TABLET | Freq: Every day | ORAL | 0 refills | Status: DC

## 2020-03-21 MED ORDER — PANTOPRAZOLE SODIUM 40 MG PO TBEC: 40.0000 mg | DELAYED_RELEASE_TABLET | Freq: Every day | ORAL | 1 refills | Status: DC

## 2020-03-21 MED ORDER — ALBUTEROL SULFATE HFA 108 (90 BASE) MCG/ACT IN AERS: 2.0000 | INHALATION_SPRAY | Freq: Four times a day (QID) | RESPIRATORY_TRACT | 1 refills | Status: DC | PRN

## 2020-03-21 MED ORDER — THIAMINE HCL 100 MG PO TABS: 100.0000 mg | ORAL_TABLET | Freq: Every day | ORAL | 1 refills | Status: DC

## 2020-03-21 MED ORDER — ALBUTEROL SULFATE HFA 108 (90 BASE) MCG/ACT IN AERS: 2.0000 | INHALATION_SPRAY | Freq: Four times a day (QID) | RESPIRATORY_TRACT | 0 refills | Status: DC | PRN

## 2020-03-21 MED ORDER — FLUTICASONE-SALMETEROL 100-50 MCG/DOSE IN AEPB: 1.0000 | INHALATION_SPRAY | Freq: Two times a day (BID) | RESPIRATORY_TRACT | 0 refills | Status: DC

## 2020-03-21 MED ORDER — CITALOPRAM HYDROBROMIDE 20 MG PO TABS: 20.0000 mg | ORAL_TABLET | Freq: Every day | ORAL | 0 refills | Status: DC

## 2020-03-21 MED ORDER — ALLOPURINOL 300 MG PO TABS: 300.0000 mg | ORAL_TABLET | Freq: Every day | ORAL | 0 refills | Status: DC

## 2020-03-21 MED ORDER — PANTOPRAZOLE SODIUM 40 MG PO TBEC: 40.0000 mg | DELAYED_RELEASE_TABLET | Freq: Every day | ORAL | 0 refills | Status: DC

## 2020-03-21 NOTE — Plan of Care (Signed)
  Problem: Education: Goal: Knowledge of [facility] Education information/materials will improve Outcome: Progressing Goal: Emotional status will improve Outcome: Progressing Goal: Mental status will improve Outcome: Progressing Goal: Verbalization of understanding the information provided will improve Outcome: Progressing   

## 2020-03-21 NOTE — BHH Suicide Risk Assessment (Signed)
[facility] Discharge Suicide Risk Assessment   Principal Problem: Alcohol use disorder, severe, dependence ([place]) Discharge Diagnoses: Principal Problem:   Alcohol use disorder, severe, dependence ([place]) Active Problems:   Hypertension   Self-inflicted laceration of left wrist ([facility])   MDD (major depressive disorder), recurrent severe, without psychosis ([place])   Total Time spent with patient: 30 minutes  Musculoskeletal: Strength & Muscle Tone: within normal limits Gait & Station: normal Patient leans: N/A  Psychiatric Specialty Exam: Review of Systems  Constitutional: Negative.   HENT: Negative.   Eyes: Negative.   Respiratory: Negative.   Cardiovascular: Negative.   Gastrointestinal: Negative.   Musculoskeletal: Negative.   Skin: Negative.   Neurological: Negative.   Psychiatric/Behavioral: Negative.     Blood pressure (!) 172/95, pulse 97, temperature 97.6 F (36.4 C), temperature source Oral, resp. rate 17, height 6' (1.829 m), weight 84.8 kg, SpO2 100 %.Body mass index is 25.36 kg/m.  General Appearance: Casual  Eye Contact::  Good  Speech:  Clear and [id number]  Volume:  Normal  Mood:  Euthymic  Affect:  Congruent  Thought Process:  Goal Directed  Orientation:  Full (Time, Place, and Person)  Thought Content:  Logical  Suicidal Thoughts:  No  Homicidal Thoughts:  No  Memory:  Immediate;   Fair Recent;   Fair Remote;   Fair  Judgement:  Fair  Insight:  Fair  Psychomotor Activity:  Normal  Concentration:  Fair  Recall:  [employer] of Knowledge:Fair  Language: Fair  Akathisia:  No  Handed:  Right  AIMS (if indicated):     Assets:  Communication Skills Housing Resilience Social Support  Sleep:  Number of Hours: 7  Cognition: WNL  ADL's:  Intact   Mental Status Per Nursing Assessment::   On Admission:  Self-harm behaviors  Demographic Factors:  Male, Caucasian and Unemployed  Loss Factors: Financial problems/change in socioeconomic status  Historical  Factors: Prior suicide attempts  Risk Reduction Factors:   Sense of responsibility to family, Living with another person, especially a relative and Positive therapeutic relationship  Continued Clinical Symptoms:  Depression:   Comorbid alcohol abuse/dependence Alcohol/Substance Abuse/Dependencies  Cognitive Features That Contribute To Risk:  None    Suicide Risk:  Minimal: No identifiable suicidal ideation.  Patients presenting with no risk factors but with morbid ruminations; may be classified as minimal risk based on the severity of the depressive symptoms    Plan Of Care/Follow-up recommendations:  Activity:  Activity as tolerated Diet:  Regular diet Other:  Patient is strongly urged to follow-up with Alcoholics Anonymous and could consider follow-up with [facility] and local substance abuse treatment  Alethia Berthold, MD 03/21/2020, 10:04 AM

## 2020-03-21 NOTE — Progress Notes (Signed)
Discharge Note:  The patient was discharged with his belongings, a supply of medications, prescriptions, and follow-up documentation.  His phone and wallet were returned.  Verlie stated that he plans to attend AA meetings in his local area.  He denied thoughts of harming himself and others.  Mood ws stable and bright.

## 2020-03-21 NOTE — BHH Group Notes (Signed)
LCSW Group Therapy Note  03/21/2020 2:11 PM  Type of Therapy/Topic:  Group Therapy:  Balance in Life  Participation Level:  Did Not Attend  Description of Group:    This group will address the concept of balance and how it feels and looks when one is unbalanced. Patients will be encouraged to process areas in their lives that are out of balance and identify reasons for remaining unbalanced. Facilitators will guide patients in utilizing problem-solving interventions to address and correct the stressor making their life unbalanced. Understanding and applying boundaries will be explored and addressed for obtaining and maintaining a balanced life. Patients will be encouraged to explore ways to assertively make their unbalanced needs known to significant others in their lives, using other group members and facilitator for support and feedback.  Therapeutic Goals: 1. Patient will identify two or more emotions or situations they have that consume much of in their lives. 2. Patient will identify signs/triggers that life has become out of balance:  3. Patient will identify two ways to set boundaries in order to achieve balance in their lives:  4. Patient will demonstrate ability to communicate their needs through discussion and/or role plays  Summary of Patient Progress: X  Therapeutic Modalities:   Cognitive Behavioral Therapy Solution-Focused Therapy Assertiveness Training  Assunta Curtis MSW, LCSW 03/21/2020 2:11 PM

## 2020-03-21 NOTE — Plan of Care (Signed)
Patient prepared for discharge

## 2020-03-21 NOTE — Progress Notes (Signed)
Recreation Therapy Notes  Date: 03/21/2020  Time: 9:30 am   Location: Room 21   Behavioral response: N/A   Intervention Topic: Animal Assisted therapy    Discussion/Intervention: Patient did not attend group.   Clinical Observations/Feedback:  Patient did not attend group.   Danajah Birdsell LRT/CTRS          Taia Bramlett 03/21/2020 12:54 PM 

## 2020-03-21 NOTE — Progress Notes (Signed)
  [facility] Adult Case Management Discharge Plan :  Will you be returning to the same living situation after discharge:  Yes,  pt reports that he is returning home. At discharge, do you have transportation home?: Yes,  CSW will assist with transportation needs. Do you have the ability to pay for your medications: No.  Release of information consent forms completed and in the chart;  Patient's signature needed at discharge.  Patient to Follow up at: Follow-up Information    Patient declined. Follow up.   Why: Patient declined.  Patient reports plans to follow up with AA. Contact information: Patient declined.          Next level of care provider has access to [place] and Suicide Prevention discussed: No.  Pt declined SPE contact.      Has patient been referred to the Quitline?: Patient refused referral  Patient has been referred for addiction treatment: Pt. refused referral  Rozann Lesches, LCSW 03/21/2020, 11:34 AM

## 2020-03-21 NOTE — Discharge Summary (Signed)
Physician Discharge Summary Note  Patient:  Miguel [name] is an 59 y.o., male MRN:  [id number] DOB:  1960/12/28 Patient phone:  [phone] (home)  Patient address:   [place] [address],  Total Time spent with patient: 30 minutes  Date of Admission:  03/17/2020 Date of Discharge: Mar 21, 2020  Reason for Admission: Admitted through the emergency room where he presented intoxicated with suicidal statements and wrist cutting  Principal Problem: Alcohol use disorder, severe, dependence ([place]) Discharge Diagnoses: Principal Problem:   Alcohol use disorder, severe, dependence ([place]) Active Problems:   Hypertension   Self-inflicted laceration of left wrist ([place])   MDD (major depressive disorder), recurrent severe, without psychosis ([place])   Past Psychiatric History: History of alcohol abuse and depression with self injury and suicidal ideation particularly while intoxicated  Past Medical History:  Past Medical History:  Diagnosis Date  . Alcohol abuse   . Anxiety   . Asthma   . GERD (gastroesophageal reflux disease)   . Gout   . Hypertension   . Kidney stone   . OCD (obsessive compulsive disorder)   . Renal colic     Past Surgical History:  Procedure Laterality Date  . CHOLECYSTECTOMY  2012  . EXTRACORPOREAL SHOCK WAVE LITHOTRIPSY Left 01/12/2019   Procedure: EXTRACORPOREAL SHOCK WAVE LITHOTRIPSY (ESWL);  Surgeon: Billey Co, MD;  Location: [facility] ORS;  Service: Urology;  Laterality: Left;   Family History:  Family History  Problem Relation Age of Onset  . Alcohol abuse Father   . Breast cancer Mother 18   Family Psychiatric  History: See previous Social History:  Social History   Substance and Sexual Activity  Alcohol Use Yes  . Alcohol/week: 12.0 standard drinks  . Types: 12 Shots of liquor per week   Comment: pint daily     Social History   Substance and Sexual Activity  Drug Use No    Social History   Socioeconomic History  .  Marital status: Married    Spouse name: Not on file  . Number of children: Not on file  . Years of education: Not on file  . Highest education level: Not on file  Occupational History  . Not on file  Tobacco Use  . Smoking status: Former Smoker    Types: Cigarettes  . Smokeless tobacco: Never Used  . Tobacco comment: quit 30 years ago  Substance and Sexual Activity  . Alcohol use: Yes    Alcohol/week: 12.0 standard drinks    Types: 12 Shots of liquor per week    Comment: pint daily  . Drug use: No  . Sexual activity: Not on file  Other Topics Concern  . Not on file  Social History Narrative   Lives at home with his wife and takes care of his wife. Independent at baseline      - Biggest strain is financial but doesn't think they could got get more help.      Patient expressed interest in possible financial assistance. Informed written consent obtained   Social Determinants of Health   Financial Resource Strain:   . Difficulty of Paying Living Expenses:   Food Insecurity:   . Worried About Charity fundraiser in the Last Year:   . Arboriculturist in the Last Year:   Transportation Needs:   . Film/video editor (Medical):   Marland Kitchen Lack of Transportation (Non-Medical):   Physical Activity:   . Days of Exercise per  Week:   . Minutes of Exercise per Session:   Stress:   . Feeling of Stress :   Social Connections:   . Frequency of Communication with Friends and Family:   . Frequency of Social Gatherings with Friends and Family:   . Attends Religious Services:   . Active Member of Clubs or Organizations:   . Attends Archivist Meetings:   Marland Kitchen Marital Status:     Hospital Course: Patient was engaged in individual and group therapy.  Maintained on 15-minute checks.  Detox was completed without seizures or delirium.  Patient was interviewed multiple times and encouraged to take seriously the need to get involved in substance abuse treatment.  He continues to resist  involvement at [facility] but at least expresses an interest in attending Alcoholics Anonymous meetings.  He expresses an understanding that his health is suffering severely from his alcohol use.  Patient will be given prescriptions for his medicine including for his gout, Celexa for his depression and medicine for blood pressure and COPD at discharge.  Physical Findings: AIMS: Facial and Oral Movements Muscles of Facial Expression: None, normal Lips and Perioral Area: None, normal Jaw: None, normal Tongue: None, normal,Extremity Movements Upper (arms, wrists, hands, fingers): None, normal Lower (legs, knees, ankles, toes): None, normal, Trunk Movements Neck, shoulders, hips: None, normal, Overall Severity Severity of abnormal movements (highest score from questions above): None, normal Incapacitation due to abnormal movements: None, normal Patient's awareness of abnormal movements (rate only patient's report): No Awareness, Dental Status Current problems with teeth and/or dentures?: No Does patient usually wear dentures?: No  CIWA:  CIWA-Ar Total: 0 COWS:     Musculoskeletal: Strength & Muscle Tone: within normal limits Gait & Station: normal Patient leans: N/A  Psychiatric Specialty Exam: Physical Exam  Nursing note and vitals reviewed. Constitutional: He appears well-developed and well-nourished.  HENT:  Head: Normocephalic and atraumatic.  Eyes: Pupils are equal, round, and reactive to light. Conjunctivae are normal.  Cardiovascular: Regular rhythm and normal heart sounds.  Respiratory: Effort normal.  GI: Soft.  Musculoskeletal:        General: Normal range of motion.     Cervical back: Normal range of motion.  Neurological: He is alert.  Skin: Skin is warm and dry.  Psychiatric: He has a normal mood and affect. His speech is normal and behavior is normal. Judgment and thought content normal. Cognition and memory are normal.    Review of Systems  Constitutional: Negative.    HENT: Negative.   Eyes: Negative.   Respiratory: Negative.   Cardiovascular: Negative.   Gastrointestinal: Negative.   Musculoskeletal: Negative.   Skin: Negative.   Neurological: Negative.   Psychiatric/Behavioral: Negative.     Blood pressure (!) 172/95, pulse 97, temperature 97.6 F (36.4 C), temperature source Oral, resp. rate 17, height 6' (1.829 m), weight 84.8 kg, SpO2 100 %.Body mass index is 25.36 kg/m.  General Appearance: Casual  Eye Contact:  Good  Speech:  Clear and Coherent  Volume:  Normal  Mood:  Euthymic  Affect:  Congruent  Thought Process:  Goal Directed  Orientation:  Full (Time, Place, and Person)  Thought Content:  Logical  Suicidal Thoughts:  No  Homicidal Thoughts:  No  Memory:  Immediate;   Fair Recent;   Fair Remote;   Fair  Judgement:  Fair  Insight:  Fair  Psychomotor Activity:  Normal  Concentration:  Concentration: Fair  Recall:  [employer] of Knowledge:  Fair  Language:  Fair  Akathisia:  No  Handed:  Right  AIMS (if indicated):     Assets:  Desire for Improvement  ADL's:  Intact  Cognition:  WNL  Sleep:  Number of Hours: 7        Has this patient used any form of tobacco in the last 30 days? (Cigarettes, Smokeless Tobacco, Cigars, and/or Pipes) Yes, No  Blood Alcohol level:  Lab Results  Component Value Date   ETH 230 (H) 03/16/2020   ETH 124 (H) 123XX123    Metabolic Disorder Labs:  Lab Results  Component Value Date   HGBA1C 5.3 03/18/2020   MPG 105.41 03/18/2020   MPG 100 03/12/2017   No results found for: PROLACTIN Lab Results  Component Value Date   CHOL 150 03/18/2020   TRIG 137 03/18/2020   HDL 64 03/18/2020   CHOLHDL 2.3 03/18/2020   VLDL 27 03/18/2020   LDLCALC 59 03/18/2020   LDLCALC 71 03/12/2017    See Psychiatric Specialty Exam and Suicide Risk Assessment completed by Attending Physician prior to discharge.  Discharge destination:  Home  Is patient on multiple antipsychotic therapies at  discharge:  No   Has Patient had three or more failed trials of antipsychotic monotherapy by history:  No  Recommended Plan for Multiple Antipsychotic Therapies: NA  Discharge Instructions    Diet - low sodium heart healthy   Complete by: As directed    Increase activity slowly   Complete by: As directed      Allergies as of 03/21/2020      Reactions   Percocet [oxycodone-acetaminophen] Nausea And Vomiting      Medication List    STOP taking these medications   ascorbic acid 500 MG tablet Commonly known as: VITAMIN C   chlordiazePOXIDE 25 MG capsule Commonly known as: LIBRIUM   cloNIDine 0.1 MG tablet Commonly known as: CATAPRES   colchicine 0.6 MG tablet   folic acid 1 MG tablet Commonly known as: FOLVITE   multivitamins with iron Tabs tablet     TAKE these medications     Indication  albuterol 108 (90 Base) MCG/ACT inhaler Commonly known as: VENTOLIN HFA Inhale 2 puffs into the lungs every 6 (six) hours as needed.  Indication: Asthma   allopurinol 300 MG tablet Commonly known as: ZYLOPRIM Take 1 tablet (300 mg total) by mouth daily.  Indication: Primary Gout   citalopram 20 MG tablet Commonly known as: CeleXA Take 1 tablet (20 mg total) by mouth daily.  Indication: Depression   Fluticasone-Salmeterol 100-50 MCG/DOSE Aepb Commonly known as: Advair Diskus Inhale 1 puff into the lungs 2 (two) times daily.  Indication: Asthma   lisinopril 40 MG tablet Commonly known as: ZESTRIL Take 1 tablet (40 mg total) by mouth daily.  Indication: High Blood Pressure Disorder   pantoprazole 40 MG tablet Commonly known as: PROTONIX Take 1 tablet (40 mg total) by mouth daily.  Indication: Gastroesophageal Reflux Disease   QUEtiapine 50 MG tablet Commonly known as: SEROQUEL Take 1 tablet (50 mg total) by mouth at bedtime.  Indication: Major Depressive Disorder, insomnia   thiamine 100 MG tablet Take 1 tablet (100 mg total) by mouth daily. Start taking on: Mar 22, 2020  Indication: Deficiency of Vitamin B1        Follow-up recommendations:  Activity:  Activity as tolerated Diet:  Regular diet Other:  Follow-up with outpatient substance abuse treatment and medical treatment  Comments: Prescriptions provided at discharge.  7-day supply provided at discharge  Signed: Alethia Berthold, MD 03/21/2020, 10:28 AM

## 2020-03-21 NOTE — Progress Notes (Signed)
Patient resting in bed with eyes closed. No acute distress

## 2020-03-21 NOTE — B[facility] Counselor (Signed)
Pt is scheduled for discharge today. CSW met with pt to discuss discharge plan. Pt declines referral for residential and outpatient treatment. Pt reports he received a list of AA support groups from physician(Dr. Clapacs) that he plans to attend.

## 2020-03-22 ENCOUNTER — Emergency Department
Admission: EM | Admit: 2020-03-22 | Discharge: 2020-03-22 | Disposition: A | Discharge status: Home / Self Care | Payer: Self-pay | Attending: Emergency Medicine | Admitting: Emergency Medicine

## 2020-03-22 ENCOUNTER — Encounter: Payer: Self-pay | Admitting: Emergency Medicine

## 2020-03-22 ENCOUNTER — Emergency Department: Payer: Self-pay

## 2020-03-22 ENCOUNTER — Other Ambulatory Visit: Payer: Self-pay

## 2020-03-22 DIAGNOSIS — Y9389 Activity, other specified: Secondary | ICD-10-CM | POA: Insufficient documentation

## 2020-03-22 DIAGNOSIS — S0990XA Unspecified injury of head, initial encounter: Secondary | ICD-10-CM | POA: Insufficient documentation

## 2020-03-22 DIAGNOSIS — Z789 Other specified health status: Secondary | ICD-10-CM

## 2020-03-22 DIAGNOSIS — W19XXXA Unspecified fall, initial encounter: Secondary | ICD-10-CM

## 2020-03-22 DIAGNOSIS — Y999 Unspecified external cause status: Secondary | ICD-10-CM | POA: Insufficient documentation

## 2020-03-22 DIAGNOSIS — W1789XA Other fall from one level to another, initial encounter: Secondary | ICD-10-CM | POA: Insufficient documentation

## 2020-03-22 DIAGNOSIS — I10 Essential (primary) hypertension: Secondary | ICD-10-CM | POA: Insufficient documentation

## 2020-03-22 DIAGNOSIS — Z87891 Personal history of nicotine dependence: Secondary | ICD-10-CM | POA: Insufficient documentation

## 2020-03-22 DIAGNOSIS — Y906 Blood alcohol level of 120-199 mg/100 ml: Secondary | ICD-10-CM | POA: Insufficient documentation

## 2020-03-22 DIAGNOSIS — Y929 Unspecified place or not applicable: Secondary | ICD-10-CM | POA: Insufficient documentation

## 2020-03-22 DIAGNOSIS — Z79899 Other long term (current) drug therapy: Secondary | ICD-10-CM | POA: Insufficient documentation

## 2020-03-22 DIAGNOSIS — F1099 Alcohol use, unspecified with unspecified alcohol-induced disorder: Secondary | ICD-10-CM | POA: Insufficient documentation

## 2020-03-22 DIAGNOSIS — Z7289 Other problems related to lifestyle: Secondary | ICD-10-CM

## 2020-03-22 DIAGNOSIS — J45909 Unspecified asthma, uncomplicated: Secondary | ICD-10-CM | POA: Insufficient documentation

## 2020-03-22 LAB — CBC
HCT: 39 % (ref 39.0–52.0)
Hemoglobin: 12.3 g/dL — ABNORMAL LOW (ref 13.0–17.0)
MCH: 29.4 pg (ref 26.0–34.0)
MCHC: 31.5 g/dL (ref 30.0–36.0)
MCV: 93.1 fL (ref 80.0–100.0)
Platelets: 303 10*3/uL (ref 150–400)
RBC: 4.19 MIL/uL — ABNORMAL LOW (ref 4.22–5.81)
RDW: 15.9 % — ABNORMAL HIGH (ref 11.5–15.5)
WBC: 5.9 10*3/uL (ref 4.0–10.5)
nRBC: 0 % (ref 0.0–0.2)

## 2020-03-22 LAB — BASIC METABOLIC PANEL
Anion gap: 6 (ref 5–15)
BUN: 22 mg/dL — ABNORMAL HIGH (ref 6–20)
CO2: 28 mmol/L (ref 22–32)
Calcium: 8.9 mg/dL (ref 8.9–10.3)
Chloride: 108 mmol/L (ref 98–111)
Creatinine, Ser: 1.43 mg/dL — ABNORMAL HIGH (ref 0.61–1.24)
GFR calc Af Amer: >60 mL/min (ref >60)
GFR calc non Af Amer: 53 mL/min — ABNORMAL LOW (ref >60)
Glucose, Bld: 96 mg/dL (ref 70–99)
Potassium: 4.4 mmol/L (ref 3.5–5.1)
Sodium: 142 mmol/L (ref 135–145)

## 2020-03-22 LAB — ETHANOL: Alcohol, Ethyl (B): 189 mg/dL — ABNORMAL HIGH (ref <10)

## 2020-03-22 NOTE — ED Notes (Signed)
Pt transported to CT ?
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
## 2020-03-22 NOTE — ED Notes (Signed)
Pt had a fall and hit his head. Pt has a pain of an 8/10, states he doesn't think he lost consciousness but isn't sure. Pt denies dizziness.
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
## 2020-04-10 ENCOUNTER — Ambulatory Visit: Payer: Medicaid Other | Admitting: Gerontology

## 2020-04-11 ENCOUNTER — Other Ambulatory Visit: Payer: Self-pay

## 2020-04-11 ENCOUNTER — Emergency Department: Payer: Self-pay

## 2020-04-11 DIAGNOSIS — M109 Gout, unspecified: Secondary | ICD-10-CM | POA: Diagnosis present

## 2020-04-11 DIAGNOSIS — Z79899 Other long term (current) drug therapy: Secondary | ICD-10-CM

## 2020-04-11 DIAGNOSIS — R339 Retention of urine, unspecified: Secondary | ICD-10-CM | POA: Diagnosis present

## 2020-04-11 DIAGNOSIS — Z87891 Personal history of nicotine dependence: Secondary | ICD-10-CM

## 2020-04-11 DIAGNOSIS — K219 Gastro-esophageal reflux disease without esophagitis: Secondary | ICD-10-CM | POA: Diagnosis present

## 2020-04-11 DIAGNOSIS — N200 Calculus of kidney: Secondary | ICD-10-CM | POA: Diagnosis present

## 2020-04-11 DIAGNOSIS — Z87442 Personal history of urinary calculi: Secondary | ICD-10-CM

## 2020-04-11 DIAGNOSIS — J45909 Unspecified asthma, uncomplicated: Secondary | ICD-10-CM | POA: Diagnosis present

## 2020-04-11 DIAGNOSIS — Z9049 Acquired absence of other specified parts of digestive tract: Secondary | ICD-10-CM

## 2020-04-11 DIAGNOSIS — Z803 Family history of malignant neoplasm of breast: Secondary | ICD-10-CM

## 2020-04-11 DIAGNOSIS — N170 Acute kidney failure with tubular necrosis: Principal | ICD-10-CM | POA: Diagnosis present

## 2020-04-11 DIAGNOSIS — F329 Major depressive disorder, single episode, unspecified: Secondary | ICD-10-CM | POA: Diagnosis present

## 2020-04-11 DIAGNOSIS — F429 Obsessive-compulsive disorder, unspecified: Secondary | ICD-10-CM | POA: Diagnosis present

## 2020-04-11 DIAGNOSIS — E872 Acidosis: Secondary | ICD-10-CM | POA: Diagnosis present

## 2020-04-11 DIAGNOSIS — Z20822 Contact with and (suspected) exposure to covid-19: Secondary | ICD-10-CM | POA: Diagnosis present

## 2020-04-11 DIAGNOSIS — E869 Volume depletion, unspecified: Secondary | ICD-10-CM | POA: Diagnosis present

## 2020-04-11 DIAGNOSIS — Z811 Family history of alcohol abuse and dependence: Secondary | ICD-10-CM

## 2020-04-11 DIAGNOSIS — Z885 Allergy status to narcotic agent status: Secondary | ICD-10-CM

## 2020-04-11 DIAGNOSIS — I10 Essential (primary) hypertension: Secondary | ICD-10-CM | POA: Diagnosis present

## 2020-04-11 DIAGNOSIS — I160 Hypertensive urgency: Secondary | ICD-10-CM | POA: Diagnosis present

## 2020-04-11 DIAGNOSIS — F1020 Alcohol dependence, uncomplicated: Secondary | ICD-10-CM | POA: Diagnosis present

## 2020-04-11 DIAGNOSIS — R531 Weakness: Secondary | ICD-10-CM | POA: Diagnosis present

## 2020-04-11 LAB — BASIC METABOLIC PANEL
Anion gap: 19 — ABNORMAL HIGH (ref 5–15)
BUN: 83 mg/dL — ABNORMAL HIGH (ref 6–20)
CO2: 18 mmol/L — ABNORMAL LOW (ref 22–32)
Calcium: 8.6 mg/dL — ABNORMAL LOW (ref 8.9–10.3)
Chloride: 98 mmol/L (ref 98–111)
Creatinine, Ser: 18.81 mg/dL — ABNORMAL HIGH (ref 0.61–1.24)
GFR calc Af Amer: 3 mL/min — ABNORMAL LOW (ref >60)
GFR calc non Af Amer: 2 mL/min — ABNORMAL LOW (ref >60)
Glucose, Bld: 93 mg/dL (ref 70–99)
Potassium: 5.2 mmol/L — ABNORMAL HIGH (ref 3.5–5.1)
Sodium: 135 mmol/L (ref 135–145)

## 2020-04-11 LAB — CBC
HCT: 35 % — ABNORMAL LOW (ref 39.0–52.0)
Hemoglobin: 11.7 g/dL — ABNORMAL LOW (ref 13.0–17.0)
MCH: 29.2 pg (ref 26.0–34.0)
MCHC: 33.4 g/dL (ref 30.0–36.0)
MCV: 87.3 fL (ref 80.0–100.0)
Platelets: 207 10*3/uL (ref 150–400)
RBC: 4.01 MIL/uL — ABNORMAL LOW (ref 4.22–5.81)
RDW: 15.7 % — ABNORMAL HIGH (ref 11.5–15.5)
WBC: 7.9 10*3/uL (ref 4.0–10.5)
nRBC: 0 % (ref 0.0–0.2)

## 2020-04-11 LAB — TROPONIN I (HIGH SENSITIVITY): Troponin I (High Sensitivity): 14 ng/L (ref <18)

## 2020-04-11 MED ORDER — SODIUM CHLORIDE 0.9% FLUSH
3.0000 mL | Freq: Once | INTRAVENOUS | Status: AC
  Administered 2020-04-12: 3 mL via INTRAVENOUS

## 2020-04-11 NOTE — ED Triage Notes (Signed)
Pt to the er for right sided chest pain that is worse with deep breathing and movement. Pt is unable to get comfortable.

## 2020-04-12 ENCOUNTER — Other Ambulatory Visit: Payer: Self-pay

## 2020-04-12 ENCOUNTER — Inpatient Hospital Stay
Admission: EM | Admit: 2020-04-12 | Discharge: 2020-04-19 | DRG: 683 | Disposition: A | Discharge status: Home Health | Payer: Self-pay | Attending: Internal Medicine | Admitting: Internal Medicine

## 2020-04-12 ENCOUNTER — Inpatient Hospital Stay: Payer: Self-pay

## 2020-04-12 ENCOUNTER — Encounter: Payer: Self-pay | Admitting: Internal Medicine

## 2020-04-12 ENCOUNTER — Emergency Department: Payer: Self-pay

## 2020-04-12 DIAGNOSIS — R1011 Right upper quadrant pain: Secondary | ICD-10-CM

## 2020-04-12 DIAGNOSIS — N179 Acute kidney failure, unspecified: Secondary | ICD-10-CM | POA: Diagnosis present

## 2020-04-12 DIAGNOSIS — R109 Unspecified abdominal pain: Secondary | ICD-10-CM | POA: Diagnosis present

## 2020-04-12 DIAGNOSIS — I160 Hypertensive urgency: Secondary | ICD-10-CM | POA: Diagnosis present

## 2020-04-12 DIAGNOSIS — F1020 Alcohol dependence, uncomplicated: Secondary | ICD-10-CM | POA: Diagnosis present

## 2020-04-12 DIAGNOSIS — F10220 Alcohol dependence with intoxication, uncomplicated: Secondary | ICD-10-CM | POA: Diagnosis present

## 2020-04-12 LAB — COMPREHENSIVE METABOLIC PANEL
ALT: 11 U/L (ref 0–44)
ALT: 11 U/L (ref 0–44)
AST: 9 U/L — ABNORMAL LOW (ref 15–41)
AST: 8 U/L — ABNORMAL LOW (ref 15–41)
Albumin: 4.1 g/dL (ref 3.5–5.0)
Albumin: 3.5 g/dL (ref 3.5–5.0)
Alkaline Phosphatase: 67 U/L (ref 38–126)
Alkaline Phosphatase: 61 U/L (ref 38–126)
Anion gap: 20 — ABNORMAL HIGH (ref 5–15)
Anion gap: 17 — ABNORMAL HIGH (ref 5–15)
BUN: 81 mg/dL — ABNORMAL HIGH (ref 6–20)
BUN: 80 mg/dL — ABNORMAL HIGH (ref 6–20)
CO2: 18 mmol/L — ABNORMAL LOW (ref 22–32)
CO2: 17 mmol/L — ABNORMAL LOW (ref 22–32)
Calcium: 9.1 mg/dL (ref 8.9–10.3)
Calcium: 8.6 mg/dL — ABNORMAL LOW (ref 8.9–10.3)
Chloride: 99 mmol/L (ref 98–111)
Chloride: 101 mmol/L (ref 98–111)
Creatinine, Ser: 17.99 mg/dL — ABNORMAL HIGH (ref 0.61–1.24)
Creatinine, Ser: 16.64 mg/dL — ABNORMAL HIGH (ref 0.61–1.24)
GFR calc Af Amer: 3 mL/min — ABNORMAL LOW (ref >60)
GFR calc Af Amer: 3 mL/min — ABNORMAL LOW (ref >60)
GFR calc non Af Amer: 2 mL/min — ABNORMAL LOW (ref >60)
GFR calc non Af Amer: 3 mL/min — ABNORMAL LOW (ref >60)
Glucose, Bld: 85 mg/dL (ref 70–99)
Glucose, Bld: 84 mg/dL (ref 70–99)
Potassium: 4.4 mmol/L (ref 3.5–5.1)
Potassium: 4.8 mmol/L (ref 3.5–5.1)
Sodium: 137 mmol/L (ref 135–145)
Sodium: 135 mmol/L (ref 135–145)
Total Bilirubin: 1.3 mg/dL — ABNORMAL HIGH (ref 0.3–1.2)
Total Bilirubin: 0.9 mg/dL (ref 0.3–1.2)
Total Protein: 7.6 g/dL (ref 6.5–8.1)
Total Protein: 6.4 g/dL — ABNORMAL LOW (ref 6.5–8.1)

## 2020-04-12 LAB — CBC
HCT: 34.1 % — ABNORMAL LOW (ref 39.0–52.0)
Hemoglobin: 11.3 g/dL — ABNORMAL LOW (ref 13.0–17.0)
MCH: 29.1 pg (ref 26.0–34.0)
MCHC: 33.1 g/dL (ref 30.0–36.0)
MCV: 87.9 fL (ref 80.0–100.0)
Platelets: 166 10*3/uL (ref 150–400)
RBC: 3.88 MIL/uL — ABNORMAL LOW (ref 4.22–5.81)
RDW: 15.6 % — ABNORMAL HIGH (ref 11.5–15.5)
WBC: 6.5 10*3/uL (ref 4.0–10.5)
nRBC: 0 % (ref 0.0–0.2)

## 2020-04-12 LAB — PHOSPHORUS: Phosphorus: 11.8 mg/dL — ABNORMAL HIGH (ref 2.5–4.6)

## 2020-04-12 LAB — OSMOLALITY: Osmolality: 304 mOsm/kg — ABNORMAL HIGH (ref 275–295)

## 2020-04-12 LAB — URINALYSIS, COMPLETE (UACMP) WITH MICROSCOPIC
Bilirubin Urine: NEGATIVE
Glucose, UA: NEGATIVE mg/dL
Hgb urine dipstick: NEGATIVE
Ketones, ur: 5 mg/dL — AB
Leukocytes,Ua: NEGATIVE
Nitrite: NEGATIVE
Protein, ur: NEGATIVE mg/dL
Specific Gravity, Urine: 1.008 (ref 1.005–1.030)
pH: 6 (ref 5.0–8.0)

## 2020-04-12 LAB — TROPONIN I (HIGH SENSITIVITY): Troponin I (High Sensitivity): 15 ng/L (ref <18)

## 2020-04-12 LAB — ETHANOL: Alcohol, Ethyl (B): <10 mg/dL (ref <10)

## 2020-04-12 LAB — MAGNESIUM
Magnesium: 1.9 mg/dL (ref 1.7–2.4)
Magnesium: 1.8 mg/dL (ref 1.7–2.4)

## 2020-04-12 LAB — SARS CORONAVIRUS 2 BY RT PCR (HOSPITAL ORDER, PERFORMED IN ~~LOC~~ HOSPITAL LAB): SARS Coronavirus 2: NEGATIVE

## 2020-04-12 LAB — PROTIME-INR
INR: 1 (ref 0.8–1.2)
Prothrombin Time: 13.1 seconds (ref 11.4–15.2)

## 2020-04-12 LAB — LIPASE, BLOOD: Lipase: 80 U/L — ABNORMAL HIGH (ref 11–51)

## 2020-04-12 LAB — CK: Total CK: 17 U/L — ABNORMAL LOW (ref 49–397)

## 2020-04-12 LAB — HIV ANTIBODY (ROUTINE TESTING W REFLEX): HIV Screen 4th Generation wRfx: NONREACTIVE

## 2020-04-12 MED ORDER — QUETIAPINE FUMARATE 25 MG PO TABS
50.0000 mg | ORAL_TABLET | Freq: Every day | ORAL | Status: DC
  Administered 2020-04-12 – 2020-04-18 (×7): 50 mg via ORAL
  Filled 2020-04-12 (×7): qty 2

## 2020-04-12 MED ORDER — ONDANSETRON HCL 4 MG/2ML IJ SOLN
4.0000 mg | Freq: Once | INTRAMUSCULAR | Status: AC
  Administered 2020-04-12: 4 mg via INTRAVENOUS
  Filled 2020-04-12: qty 2

## 2020-04-12 MED ORDER — ONDANSETRON HCL 4 MG/2ML IJ SOLN
4.0000 mg | Freq: Four times a day (QID) | INTRAMUSCULAR | Status: DC | PRN
  Administered 2020-04-18: 09:00:00 4 mg via INTRAVENOUS
  Filled 2020-04-12: qty 2

## 2020-04-12 MED ORDER — LORAZEPAM 2 MG/ML IJ SOLN
1.0000 mg | Freq: Once | INTRAMUSCULAR | Status: AC
  Administered 2020-04-12: 1 mg via INTRAVENOUS
  Filled 2020-04-12: qty 1

## 2020-04-12 MED ORDER — LORAZEPAM 1 MG PO TABS: 1.0000 mg | ORAL_TABLET | ORAL | Status: AC | PRN

## 2020-04-12 MED ORDER — THIAMINE HCL 100 MG/ML IJ SOLN
Freq: Once | INTRAVENOUS | Status: AC
  Filled 2020-04-12: qty 1000

## 2020-04-12 MED ORDER — LORAZEPAM 2 MG/ML IJ SOLN
0.0000 mg | INTRAMUSCULAR | Status: AC
  Administered 2020-04-12: 1 mg via INTRAVENOUS
  Filled 2020-04-12: qty 1

## 2020-04-12 MED ORDER — THIAMINE HCL 100 MG/ML IJ SOLN
100.0000 mg | Freq: Once | INTRAMUSCULAR | Status: AC
  Administered 2020-04-12: 100 mg via INTRAVENOUS
  Filled 2020-04-12: qty 2

## 2020-04-12 MED ORDER — AMLODIPINE BESYLATE 10 MG PO TABS
10.0000 mg | ORAL_TABLET | Freq: Every day | ORAL | Status: DC
  Administered 2020-04-12 – 2020-04-19 (×8): 10 mg via ORAL
  Filled 2020-04-12 (×2): qty 1
  Filled 2020-04-12: qty 2
  Filled 2020-04-12 (×4): qty 1
  Filled 2020-04-12: qty 2

## 2020-04-12 MED ORDER — SODIUM BICARBONATE-DEXTROSE 150-5 MEQ/L-% IV SOLN
150.0000 meq | INTRAVENOUS | Status: DC
  Administered 2020-04-12 – 2020-04-14 (×5): 150 meq via INTRAVENOUS
  Filled 2020-04-12 (×7): qty 1000

## 2020-04-12 MED ORDER — HEPARIN SODIUM (PORCINE) 5000 UNIT/ML IJ SOLN
5000.0000 [IU] | Freq: Three times a day (TID) | INTRAMUSCULAR | Status: DC
  Administered 2020-04-12 – 2020-04-19 (×21): 5000 [IU] via SUBCUTANEOUS
  Filled 2020-04-12 (×21): qty 1

## 2020-04-12 MED ORDER — TECHNETIUM TC 99M DIETHYLENETRIAME-PENTAACETIC ACID
30.0000 | Freq: Once | INTRAVENOUS | Status: AC | PRN
  Administered 2020-04-12: 32.65 via RESPIRATORY_TRACT
  Filled 2020-04-12: qty 30

## 2020-04-12 MED ORDER — MOMETASONE FURO-FORMOTEROL FUM 100-5 MCG/ACT IN AERO
2.0000 | INHALATION_SPRAY | Freq: Two times a day (BID) | RESPIRATORY_TRACT | Status: DC
  Administered 2020-04-12 – 2020-04-19 (×13): 2 via RESPIRATORY_TRACT
  Filled 2020-04-12: qty 8.8

## 2020-04-12 MED ORDER — ONDANSETRON HCL 4 MG PO TABS: 4.0000 mg | ORAL_TABLET | Freq: Four times a day (QID) | ORAL | Status: DC | PRN

## 2020-04-12 MED ORDER — TECHNETIUM TO 99M ALBUMIN AGGREGATED
4.0000 | Freq: Once | INTRAVENOUS | Status: AC | PRN
  Administered 2020-04-12: 4.357 via INTRAVENOUS
  Filled 2020-04-12: qty 4

## 2020-04-12 MED ORDER — LABETALOL HCL 5 MG/ML IV SOLN: 10.0000 mg | Freq: Four times a day (QID) | INTRAVENOUS | Status: DC | PRN

## 2020-04-12 MED ORDER — MORPHINE SULFATE (PF) 2 MG/ML IV SOLN
2.0000 mg | INTRAVENOUS | Status: DC | PRN
  Administered 2020-04-12 – 2020-04-13 (×6): 2 mg via INTRAVENOUS
  Filled 2020-04-12 (×6): qty 1

## 2020-04-12 MED ORDER — CITALOPRAM HYDROBROMIDE 20 MG PO TABS
20.0000 mg | ORAL_TABLET | Freq: Every day | ORAL | Status: DC
  Administered 2020-04-12 – 2020-04-19 (×8): 20 mg via ORAL
  Filled 2020-04-12 (×8): qty 1

## 2020-04-12 MED ORDER — PANTOPRAZOLE SODIUM 40 MG PO TBEC
40.0000 mg | DELAYED_RELEASE_TABLET | Freq: Every day | ORAL | Status: DC
  Administered 2020-04-12 – 2020-04-19 (×8): 40 mg via ORAL
  Filled 2020-04-12 (×8): qty 1

## 2020-04-12 MED ORDER — MORPHINE SULFATE (PF) 4 MG/ML IV SOLN
4.0000 mg | Freq: Once | INTRAVENOUS | Status: AC
  Administered 2020-04-12: 4 mg via INTRAVENOUS
  Filled 2020-04-12: qty 1

## 2020-04-12 MED ORDER — SODIUM CHLORIDE 0.9 % IV BOLUS
1000.0000 mL | Freq: Once | INTRAVENOUS | Status: AC
  Administered 2020-04-12: 1000 mL via INTRAVENOUS

## 2020-04-12 MED ORDER — LORAZEPAM 2 MG/ML IJ SOLN: 1.0000 mg | INTRAMUSCULAR | Status: AC | PRN

## 2020-04-12 MED ORDER — LORAZEPAM 2 MG/ML IJ SOLN
0.0000 mg | Freq: Three times a day (TID) | INTRAMUSCULAR | Status: AC
  Filled 2020-04-12: qty 1

## 2020-04-12 NOTE — H&P (Signed)
History and Physical    Miguel [name] [id number] DOB: 11-Jul-1961 DOA: 04/12/2020  PCP: Patient, No Pcp Per   Patient coming from: Home  I have personally briefly reviewed patient's old medical records in [place]  Chief Complaint: Chest pain  HPI: Miguel [name] is a 58 y.o. male with medical history significant for alcohol abuse, hypertension and gout who presents to the emergency room for evaluation of abdominal pain.  Pain is mostly in the right upper quadrant and started 1 day prior to his presentation to the ER.  He states that the pain is severe and rates it a 10 x 10 in intensity at its worst.  Abdominal pain is associated with nausea but he denies having any vomiting or denies any changes in his bowel habits.  He complains of chills but has not had any fever.  Patient is status post cholecystectomy.  Patient admits to continued alcohol use but is working on quitting. He states that he had some alcohol 3 days prior to presenting to the emergency room and before that it had been a week since he had any alcohol ingestion. Labs revealed a serum creatinine of 18 and a BUN of 81 compared to a creatinine of 1.43 and a BUN of 22.  Labs also reveal an anion gap of 19 and a serum bicarbonate level of 18. He had a CT scan of abdomen and pelvis done without contrast which does not show any evidence of obstructive uropathy.  ED Course: Patient with a history of alcoholism who presents to the emergency room for evaluation of right upper quadrant abdominal pain.  Patient is noted to have been acute renal failure.  He will be admitted to the hospital for further evaluation.  Review of Systems: As per HPI otherwise 10 point review of systems negative.    Past Medical History:  Diagnosis Date  . Alcohol abuse   . Anxiety   . Asthma   . GERD (gastroesophageal reflux disease)   . Gout   . Hypertension   . Kidney stone   . OCD (obsessive compulsive disorder)   . Renal colic     Past  Surgical History:  Procedure Laterality Date  . CHOLECYSTECTOMY  2012  . EXTRACORPOREAL SHOCK WAVE LITHOTRIPSY Left 01/12/2019   Procedure: EXTRACORPOREAL SHOCK WAVE LITHOTRIPSY (ESWL);  Surgeon: Billey Co, MD;  Location: [facility] ORS;  Service: Urology;  Laterality: Left;     reports that he has quit smoking. His smoking use included cigarettes. He has never used smokeless tobacco. He reports previous alcohol use of about 12.0 standard drinks of alcohol per week. He reports that he does not use drugs.  Allergies  Allergen Reactions  . Percocet [Oxycodone-Acetaminophen] Nausea And Vomiting    Family History  Problem Relation Age of Onset  . Alcohol abuse Father   . Breast cancer Mother 65     Prior to Admission medications   Medication Sig Start Date End Date Taking? Authorizing Provider  albuterol (VENTOLIN HFA) 108 (90 Base) MCG/ACT inhaler Inhale 2 puffs into the lungs every 6 (six) hours as needed. 03/21/20   Clapacs, Madie Reno, MD  allopurinol (ZYLOPRIM) 300 MG tablet Take 1 tablet (300 mg total) by mouth daily. 03/21/20   Clapacs, Madie Reno, MD  citalopram (CELEXA) 20 MG tablet Take 1 tablet (20 mg total) by mouth daily. 03/21/20 03/21/21  Clapacs, Madie Reno, MD  Fluticasone-Salmeterol (ADVAIR DISKUS) 100-50 MCG/DOSE AEPB Inhale 1 puff into the lungs 2 (two)  times daily. 03/21/20   Clapacs, Madie Reno, MD  lisinopril (ZESTRIL) 40 MG tablet Take 1 tablet (40 mg total) by mouth daily. 03/21/20   Clapacs, Madie Reno, MD  pantoprazole (PROTONIX) 40 MG tablet Take 1 tablet (40 mg total) by mouth daily. 03/21/20   Clapacs, Madie Reno, MD  QUEtiapine (SEROQUEL) 50 MG tablet Take 1 tablet (50 mg total) by mouth at bedtime. 03/21/20   Clapacs, Madie Reno, MD  thiamine 100 MG tablet Take 1 tablet (100 mg total) by mouth daily. 03/22/20   Gonzella Lex, MD    Physical Exam: Vitals:   04/12/20 0017 04/12/20 0614 04/12/20 0805 04/12/20 0830  BP: (!) 198/85 (!) 206/97 (!) 211/101 (!) 188/102  Pulse: 75 75  81  Resp: 18 16 16  20   Temp: 98.3 F (36.8 C) 98.6 F (37 C)    TempSrc: Oral Oral    SpO2: 100% 100%  97%  Weight:      Height:         Vitals:   04/12/20 0017 04/12/20 0614 04/12/20 0805 04/12/20 0830  BP: (!) 198/85 (!) 206/97 (!) 211/101 (!) 188/102  Pulse: 75 75  81  Resp: 18 16 16 20   Temp: 98.3 F (36.8 C) 98.6 F (37 C)    TempSrc: Oral Oral    SpO2: 100% 100%  97%  Weight:      Height:        Constitutional: NAD, alert and oriented x 3 Eyes: PERRL, lids and conjunctivae normal ENMT: Mucous membranes are moist.  Neck: normal, supple, no masses, no thyromegaly Respiratory: clear to auscultation bilaterally, no wheezing, no crackles. Normal respiratory effort. No accessory muscle use.  Cardiovascular: Regular rate and rhythm, no murmurs / rubs / gallops. No extremity edema. 2+ pedal pulses. No carotid bruits.  Abdomen: no tenderness, no masses palpated. No hepatosplenomegaly. Bowel sounds positive.  Musculoskeletal: no clubbing / cyanosis. No joint deformity upper and lower extremities.  Skin: no rashes, lesions, ulcers.  Neurologic: No gross focal neurologic deficit. Psychiatric: Normal mood and affect.   Labs on Admission: I have personally reviewed following labs and imaging studies  CBC: Recent Labs  Lab 04/11/20 2156  WBC 7.9  HGB 11.7*  HCT 35.0*  MCV 87.3  PLT [id number]   Basic Metabolic Panel: Recent Labs  Lab 04/11/20 2156 04/12/20 0813  NA 135 137  K 5.2* 4.4  CL 98 99  CO2 18* 18*  GLUCOSE 93 85  BUN 83* 81*  CREATININE 18.81* 17.99*  CALCIUM 8.6* 9.1  MG  --  1.9   GFR: Estimated Creatinine Clearance: 4.8 mL/min (A) (by C-G formula based on SCr of 17.99 mg/dL (H)). Liver Function Tests: Recent Labs  Lab 04/12/20 0813  AST 9*  ALT 11  ALKPHOS 67  BILITOT 1.3*  PROT 7.6  ALBUMIN 4.1   Recent Labs  Lab 04/12/20 0813  LIPASE 80*   No results for input(s): AMMONIA in the last 168 hours. Coagulation Profile: Recent Labs  Lab 04/12/20 0813    INR 1.0   Cardiac Enzymes: No results for input(s): CKTOTAL, CKMB, CKMBINDEX, TROPONINI in the last 168 hours. BNP (last 3 results) No results for input(s): PROBNP in the last 8760 hours. HbA1C: No results for input(s): HGBA1C in the last 72 hours. CBG: No results for input(s): GLUCAP in the last 168 hours. Lipid Profile: No results for input(s): CHOL, HDL, LDLCALC, TRIG, CHOLHDL, LDLDIRECT in the last 72 hours. Thyroid Function Tests: No results for  input(s): TSH, T4TOTAL, FREET4, T3FREE, THYROIDAB in the last 72 hours. Anemia Panel: No results for input(s): VITAMINB12, FOLATE, FERRITIN, TIBC, IRON, RETICCTPCT in the last 72 hours. Urine analysis:    Component Value Date/Time   COLORURINE STRAW (A) 11/13/2019 1530   APPEARANCEUR CLEAR (A) 11/13/2019 1530   APPEARANCEUR Hazy 06/26/2014 2238   LABSPEC 1.003 (L) 11/13/2019 1530   LABSPEC 1.036 06/26/2014 2238   PHURINE 6.0 11/13/2019 1530   GLUCOSEU NEGATIVE 11/13/2019 1530   GLUCOSEU Negative 06/26/2014 2238   HGBUR NEGATIVE 11/13/2019 1530   BILIRUBINUR NEGATIVE 11/13/2019 1530   BILIRUBINUR 1+ 06/26/2014 2238   KETONESUR NEGATIVE 11/13/2019 1530   PROTEINUR NEGATIVE 11/13/2019 1530   NITRITE NEGATIVE 11/13/2019 1530   LEUKOCYTESUR MODERATE (A) 11/13/2019 1530   LEUKOCYTESUR 1+ 06/26/2014 2238    Radiological Exams on Admission: CT Abdomen Pelvis Wo Contrast  Result Date: 04/12/2020 CLINICAL DATA:  Right chest pain, prior cholecystectomy, history of kidney stone EXAM: CT ABDOMEN AND PELVIS WITHOUT CONTRAST TECHNIQUE: Multidetector CT imaging of the abdomen and pelvis was performed following the standard protocol without IV contrast. COMPARISON:  03/03/2020 FINDINGS: Lower chest: Lung bases are clear. Hepatobiliary: Unenhanced liver is unremarkable. Status post cholecystectomy. No intrahepatic or extrahepatic ductal dilatation. Pancreas: Within normal limits. Spleen: Within normal limits. Adrenals/Urinary Tract: Adrenal  glands are within normal limits. 5 nonobstructing right renal calculi, measuring up to 4 mm in the right lower pole (series 2/image 39). Four nonobstructing left renal calculi, measuring up to 5 mm in the left upper pole (series 2/image 27). No ureteral or bladder calculi. No hydronephrosis. Bladder is within normal limits. Stomach/Bowel: Tiny hiatal hernia. No evidence of bowel obstruction. Scattered small duodenal diverticuli. Normal appendix (series 2/image 63). Scattered sigmoid diverticulosis, without evidence of diverticulitis. Vascular/Lymphatic: No evidence of abdominal aortic aneurysm. Atherosclerotic calcifications of the abdominal aorta and branch vessels. No suspicious abdominopelvic lymphadenopathy. Reproductive: Mild prostatomegaly. Other: No abdominopelvic ascites. Musculoskeletal: Degenerative changes of the visualized thoracolumbar spine. No rib fracture is seen. IMPRESSION: Multiple bilateral nonobstructing renal calculi measuring up to 5 mm. No ureteral or bladder calculi. No hydronephrosis. No evidence of bowel obstruction.  Normal appendix. Additional ancillary findings as above. Electronically Signed   By: Julian Hy M.D.   On: 04/12/2020 09:25   DG Chest 2 View  Result Date: 04/11/2020 CLINICAL DATA:  Right chest pain with deep breathing and movement EXAM: CHEST - 2 VIEW COMPARISON:  Radiograph 03/03/2020, CT 03/08/2019 FINDINGS: Some streaky opacities in the lung bases likely reflect atelectasis. Ill-defined opacity in the left lung base appears to reflect the overlap of rib shadows. No consolidation, features of edema, pneumothorax, or effusion. Pulmonary vascularity is normally distributed. The cardiomediastinal contours are unremarkable. No acute osseous or soft tissue abnormality. IMPRESSION: Basilar atelectasis. Otherwise, no acute cardiopulmonary or traumatic findings in the chest. Electronically Signed   By: Lovena Le M.D.   On: 04/11/2020 22:22    EKG: Independently  reviewed.  Normal sinus rhythm  Assessment/Plan Principal Problem:   AKI (acute kidney injury) ([place]) Active Problems:   Alcohol use disorder, severe, dependence (HCC)   Abdominal pain   Hypertensive urgency     Acute kidney injury (POA) Etiology unclear. At baseline patient has BUN/CR of 22/1.43, today on admission his renal function is 81/18 Concern for possible ingestion of toxic agents such as methanol or isopropyl alcohol Will obtain creatinine kinase levels Patient has an anion gap metabolic acidosis We will place patient on bicarbonate infusion Strict input and output Hold  Lisinopril and avoid nephrotoxic agents We will request nephrology consult for further evaluation   Hypertensive urgency Optimize blood pressure control Place patient on IV labetalol Start Amlodipine 10mg  daily   History of alcohol dependence Patient is at increased risk for developing symptoms of alcohol withdrawal We will place patient on lorazepam    Depression Continue Celexa and Seroquel       DVT prophylaxis: Heparin  Code Status: Full Family Communication: Greater than 50% of that was spent discussing patient's condition and plan of care with him at the bedside.  All questions and concerns have been addressed.  CODE STATUS patient is a full code Disposition Plan: Back to previous home environment Consults called: Nephrology    Captola Teschner MD [facility]     04/12/2020, 10:45 AM

## 2020-04-12 NOTE — ED Notes (Signed)
Patient transported to nuc med for VQ scan.

## 2020-04-12 NOTE — ED Provider Notes (Signed)
[facility] Emergency Department Provider Note   ____________________________________________   First MD Initiated Contact with Patient 04/12/20 [phone]     (approximate)  I have reviewed the triage vital signs and the nursing notes.   HISTORY  Chief Complaint Chest Pain    HPI Miguel [name] is a 59 y.o. male with medical history of alcohol abuse, hypertension, and gout who presents to the ED complaining of abdominal pain.  Patient reports he developed pain in the right upper quadrant of his abdomen yesterday morning and it has gradually worsened since then.  He describes it as sharp and exacerbated when he goes to take a deep breath it has been associated with nausea, but he has not vomited and denies any changes in his bowel movements.  He complains of chills but has not taken his temperature at home.  He denies any pain in his chest or difficulty breathing, has not had any cough.  He denies any pain or swelling in his legs, does not have any history of DVT/PE.  He is status post cholecystectomy and denies any other abdominal surgeries.  He states he has been doing much better in terms of his alcoholism, had some alcohol 3 days ago, but prior to that it had been about a week since he had any alcohol.  He denies any current withdrawal symptoms.        Past Medical History:  Diagnosis Date  . Alcohol abuse   . Anxiety   . Asthma   . GERD (gastroesophageal reflux disease)   . Gout   . Hypertension   . Kidney stone   . OCD (obsessive compulsive disorder)   . Renal colic     Patient Active Problem List   Diagnosis Date Noted  . Abdominal pain 04/12/2020  . Hypertensive urgency 04/12/2020  . Alcohol withdrawal ([place]) 03/04/2020  . Right-sided chest wall pain 02/28/2020  . Alcohol abuse with alcohol-induced mood disorder ([place]) 12/02/2019  . Moderate episode of recurrent major depressive disorder ([place])   . Health care maintenance 10/26/2019  . Right knee  pain 10/26/2019  . Generalized anxiety disorder 10/14/2019  . 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 Social anxiety disorder 07/27/2019  . Left-sided chest wall pain 06/14/2019  . Alcoholic cirrhosis of liver without ascites ([place]) 06/14/2019  . Alcohol abuse 06/14/2019  . AKI (acute kidney injury) ([name]) 05/27/2019  . Alcohol withdrawal syndrome without complication ([place]) [id number]  . Alcoholic intoxication without complication ([place])   . Sepsis ([place]) 03/08/2019  . Breast lump or mass 10/04/2018  . Sepsis secondary to UTI ([place]) 09/14/2018  . Asthma 07/07/2018  . GERD (gastroesophageal reflux disease) 07/07/2018  . OCD (obsessive compulsive disorder) 07/07/2018  . Self-inflicted laceration of left wrist ([place]) 03/10/2018  . Leg hematoma 12/25/2017  . Suicide and self-inflicted injury by cutting and piercing instrument ([place]) 03/10/2017  . Severe recurrent major 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 ([place]) 03/09/2017  . Substance induced mood disorder ([place]) 08/15/2016  . Involuntary commitment 08/15/2016  . Alcohol use disorder, severe, dependence ([place]) 02/05/2016  . Hypertension 12/05/2015  . Tachycardia 12/05/2015  . Gout 11/13/2015  . Chronic back pain 05/02/2015    Past Surgical History:  Procedure Laterality Date  . CHOLECYSTECTOMY  2012  . EXTRACORPOREAL SHOCK WAVE LITHOTRIPSY Left 01/12/2019   Procedure: EXTRACORPOREAL SHOCK WAVE LITHOTRIPSY (ESWL);  Surgeon: Billey Co, MD;  Location: [facility] ORS;  Service: Urology;  Laterality: Left;    Prior  to Admission medications   Medication Sig Start Date End Date Taking? Authorizing Provider  albuterol (VENTOLIN HFA) 108 (90 Base) MCG/ACT inhaler Inhale 2 puffs into the lungs every 6 (six) hours as needed. 03/21/20  Yes Clapacs, Madie Reno, MD  ascorbic acid (VITAMIN C) 500 MG tablet Take by mouth. 08/01/19  Yes [provider]  cloNIDine (CATAPRES) 0.1 MG tablet Take 0.1 mg by  mouth daily.   Yes [provider]  colchicine 0.6 MG tablet Take by mouth. 08/01/19  Yes [provider]  Fluticasone-Salmeterol (ADVAIR DISKUS) 100-50 MCG/DOSE AEPB Inhale 1 puff into the lungs 2 (two) times daily. 03/21/20  Yes Clapacs, Madie Reno, MD  folic acid (FOLVITE) 1 MG tablet Take by mouth. 08/01/19  Yes [provider]  pantoprazole (PROTONIX) 40 MG tablet Take 1 tablet (40 mg total) by mouth daily. 03/21/20  Yes Clapacs, Madie Reno, MD  allopurinol (ZYLOPRIM) 300 MG tablet Take 1 tablet (300 mg total) by mouth daily. 03/21/20   Clapacs, Madie Reno, MD  citalopram (CELEXA) 20 MG tablet Take 1 tablet (20 mg total) by mouth daily. 03/21/20 03/21/21  Clapacs, Madie Reno, MD  lisinopril (ZESTRIL) 40 MG tablet Take 1 tablet (40 mg total) by mouth daily. 03/21/20   Clapacs, Madie Reno, MD  QUEtiapine (SEROQUEL) 50 MG tablet Take 1 tablet (50 mg total) by mouth at bedtime. 03/21/20   Clapacs, Madie Reno, MD  thiamine 100 MG tablet Take 1 tablet (100 mg total) by mouth daily. 03/22/20   Clapacs, Madie Reno, MD    Allergies Percocet [oxycodone-acetaminophen]  Family History  Problem Relation Age of Onset  . Alcohol abuse Father   . Breast cancer Mother 62    Social History Social History   Tobacco Use  . Smoking status: Former Smoker    Types: Cigarettes  . Smokeless tobacco: Never Used  . Tobacco comment: quit 30 years ago  Substance Use Topics  . Alcohol use: Not Currently    Alcohol/week: 12.0 standard drinks    Types: 12 Shots of liquor per week    Comment: pint daily  . Drug use: No    Review of Systems  Constitutional: No fever/chills Eyes: No visual changes. ENT: No sore throat. Cardiovascular: Denies chest pain. Respiratory: Denies shortness of breath. Gastrointestinal: Positive for abdominal pain.  Positive for nausea, no vomiting.  No diarrhea.  No constipation. Genitourinary: Negative for dysuria. Musculoskeletal: Negative for back pain. Skin: Negative for  rash. Neurological: Negative for headaches, focal weakness or numbness.  ____________________________________________   PHYSICAL EXAM:  VITAL SIGNS: ED Triage Vitals  Enc Vitals Group     BP 04/11/20 2149 140/81     Pulse Rate 04/11/20 2149 66     Resp 04/11/20 2149 20     Temp 04/11/20 2149 98.3 F (36.8 C)     Temp Source 04/11/20 2149 Oral     SpO2 04/11/20 2149 100 %     Weight 04/11/20 2149 185 lb (83.9 kg)     Height 04/11/20 2149 6' (1.829 m)     Head Circumference --      Peak Flow --      Pain Score 04/11/20 2153 8     Pain Loc --      Pain Edu? --      Excl. in [place]? --     Constitutional: Alert and oriented. Eyes: Conjunctivae are normal. Head: Atraumatic. Nose: No congestion/rhinnorhea. Mouth/Throat: Mucous membranes are moist. Neck: Normal ROM Cardiovascular: Normal rate, regular  rhythm. Grossly normal heart sounds. Respiratory: Normal respiratory effort.  No retractions. Lungs CTAB. Gastrointestinal: Soft and tender to palpation in the right upper quadrant with no rebound or guarding. No distention. Genitourinary: deferred Musculoskeletal: No lower extremity tenderness nor edema. Neurologic:  Normal speech and language. No gross focal neurologic deficits are appreciated. Skin:  Skin is warm, dry and intact. No rash noted. Psychiatric: Mood and affect are normal. Speech and behavior are normal.  ____________________________________________   LABS (all labs ordered are listed, but only abnormal results are displayed)  Labs Reviewed  BASIC METABOLIC PANEL - Abnormal; Notable for the following components:      Result Value   Potassium 5.2 (*)    CO2 18 (*)    BUN 83 (*)    Creatinine, Ser 18.81 (*)    Calcium 8.6 (*)    GFR calc non Af Amer 2 (*)    GFR calc Af Amer 3 (*)    Anion gap 19 (*)    All other components within normal limits  CBC - Abnormal; Notable for the following components:   RBC 4.01 (*)    Hemoglobin 11.7 (*)    HCT 35.0 (*)     RDW 15.7 (*)    All other components within normal limits  COMPREHENSIVE METABOLIC PANEL - Abnormal; Notable for the following components:   CO2 18 (*)    BUN 81 (*)    Creatinine, Ser 17.99 (*)    AST 9 (*)    Total Bilirubin 1.3 (*)    GFR calc non Af Amer 2 (*)    GFR calc Af Amer 3 (*)    Anion gap 20 (*)    All other components within normal limits  LIPASE, BLOOD - Abnormal; Notable for the following components:   Lipase 80 (*)    All other components within normal limits  CK - Abnormal; Notable for the following components:   Total CK 17 (*)    All other components within normal limits  COMPREHENSIVE METABOLIC PANEL - Abnormal; Notable for the following components:   CO2 17 (*)    BUN 80 (*)    Creatinine, Ser 16.64 (*)    Calcium 8.6 (*)    Total Protein 6.4 (*)    AST 8 (*)    GFR calc non Af Amer 3 (*)    GFR calc Af Amer 3 (*)    Anion gap 17 (*)    All other components within normal limits  PHOSPHORUS - Abnormal; Notable for the following components:   Phosphorus 11.8 (*)    All other components within normal limits  CBC - Abnormal; Notable for the following components:   RBC 3.88 (*)    Hemoglobin 11.3 (*)    HCT 34.1 (*)    RDW 15.6 (*)    All other components within normal limits  SARS CORONAVIRUS 2 BY RT PCR (HOSPITAL ORDER, [facility])  MAGNESIUM  PROTIME-INR  ETHANOL  MAGNESIUM  OSMOLALITY  VOLATILES,BLD-ACETONE,ETHANOL,ISOPROP,METHANOL  HIV ANTIBODY (ROUTINE TESTING W REFLEX)  TROPONIN I (HIGH SENSITIVITY)  TROPONIN I (HIGH SENSITIVITY)   ____________________________________________  EKG  ED ECG REPORT I, Blake Divine, the attending physician, personally viewed and interpreted this ECG.   Date: 04/12/2020  EKG Time: 21:52  Rate: 69  Rhythm: normal sinus rhythm  Axis: Normal  Intervals:none  ST&T Change: None  ED ECG REPORT I, Blake Divine, the attending physician, personally viewed and interpreted this  ECG.  Date: 04/12/2020  EKG Time: 7:59  Rate: 85  Rhythm: normal sinus rhythm  Axis: Normal  Intervals:none  ST&T Change: None    PROCEDURES  Procedure(s) performed (including Critical Care):  .Critical Care Performed by: Blake Divine, MD Authorized by: Blake Divine, MD   Critical care provider statement:    Critical care time (minutes):  45   Critical care time was exclusive of:  Separately billable procedures and treating other patients and teaching time   Critical care was necessary to treat or prevent imminent or life-threatening deterioration of the following conditions:  Renal failure   Critical care was time spent personally by me on the following activities:  Discussions with consultants, evaluation of patient's response to treatment, examination of patient, ordering and performing treatments and interventions, ordering and review of laboratory studies, ordering and review of radiographic studies, pulse oximetry, re-evaluation of patient's condition, obtaining history from patient or surrogate and review of old charts   I assumed direction of critical care for this patient from another provider in my specialty: no       ____________________________________________   INITIAL IMPRESSION / ASSESSMENT AND PLAN / ED COURSE       59 year old male with history of alcohol abuse, hypertension, and gout presents to the ED complaining of right upper quadrant abdominal pain constant and gradually worsening since yesterday.  Pain is exacerbated by deep breath, however it is also reproducible with palpation to his right upper quadrant and I would favor GI pathology rather than pulmonary etiology.  Chest x-ray is negative for acute process, EKG with no evidence of arrhythmia or ischemia, 2 sets of troponin negative and I doubt ACS.  Low suspicion for PE given reassuring vital signs, we will further assess with CT scan.  Labs do show patient to be in acute renal failure with  creatinine of 18 and BUN of greater than 80.  Potassium mildly elevated at 5.2, we will repeat labs and add on LFTs and lipase but no signs of EKG changes related to this.  He is status post cholecystectomy, low suspicion for biliary pathology but would be concerned for hepatitis or pancreatitis given his history of alcohol abuse.  CT scan is negative for acute process, will order V/Q to rule out PE given his pleuritic pain extending up into his chest wall.  Lipase is mildly elevated, but no evidence of pancreatitis on CT.  Remainder of labs are unremarkable and case was discussed with hospitalist for admission.  Also question toxic alcohol ingestion, however patient adamantly denies this.      ____________________________________________   FINAL CLINICAL IMPRESSION(S) / ED DIAGNOSES  Final diagnoses:  Acute renal failure, unspecified acute renal failure type ([place])  RUQ pain     ED Discharge Orders    None       Note:  This document was prepared using Dragon voice recognition software and may include unintentional dictation errors.   Blake Divine, MD 04/12/20 1213

## 2020-04-12 NOTE — ED Notes (Signed)
Pt leaving for imaging.

## 2020-04-12 NOTE — ED Notes (Signed)
Pt A&Ox4 and willing to allow for I&O's to be monitored. Has no trouble urinating currently. Based on nurse judgement, pt doesn't need foley cath. Holding on foley cath at this point.

## 2020-04-12 NOTE — Consult Note (Signed)
[address] [place], [place] [address] Phone [phone]. Fax [phone]  Date: 04/12/2020                  Patient Name:  Miguel [name]  MRN: [id number]  DOB: Dec 24, 1960  Age / Sex: 59 y.o., male         PCP: Patient, No Pcp Per                 Service Requesting Consult: IM/ Collier Bullock, MD                 Reason for Consult: ARF            History of Present Illness: Patient is a 59 y.o. male with medical problems of alcohol abuse, who was admitted to [facility] on 04/12/2020 for evaluation of AKI (acute kidney injury) [facility]) [N17.9] Patient has medical problems of alcohol abuse, hypertension and gout.  He states that he developed right upper quadrant pain yesterday morning starting 4 AM which gradually worsened..  It is worse with deep breaths.  He also experienced nausea and his oral intake has been very poor.  He is drinking sips of flavored water at home.  He tries to drink at least half to three fourths of a gallon daily.  He has had 2 episodes of vomiting.  He received Covid vaccine earlier this week. He states he has not eaten since Wednesday as his symptoms kept getting worse. Per patient, his last drink was on Thursday.  He did not drink for a week and then drank half pint of vodka this Tuesday.  He also reports taking ibuprofen and Tylenol twice in the last 2 weeks. He also reports some loose stools, especially burning sensation with stools last few days.  He reports history of hemorrhoids that was discovered during previous colonoscopy  In the ER, he was discovered to have critically elevated creatinine of 17.99 Repeat creatinine 5 hours later is 16.6.  Patient is able to void clear yellow urine  CT of the abdomen noncontrast shows 5 nonobstructing right renal calculi and 4 nonobstructing left renal calculi measuring up to 5 mm in the left upper pole.  Patient has history of lithotripsy last year. Denies any acute blood in the urine.  No problems reported with  voiding  Patient denies drinking antifreeze.  He knows that animals can die from drinking it but he did not think humans could do that. He has a disabled wife at home who has multiple sclerosis.  He has been caring for her and taking her to multiple appointments.    Medications: Outpatient medications: (Not in a hospital admission)   Current medications: Current Facility-Administered Medications  Medication Dose Route Frequency Provider Last Rate Last Admin  . citalopram (CELEXA) tablet 20 mg  20 mg Oral Daily Agbata, Tochukwu, MD   20 mg at 04/12/20 1137  . heparin injection 5,000 Units  5,000 Units Subcutaneous Q8H Agbata, Tochukwu, MD      . labetalol (NORMODYNE) injection 10 mg  10 mg Intravenous Q6H PRN Agbata, Tochukwu, MD      . LORazepam (ATIVAN) injection 0-4 mg  0-4 mg Intravenous Q4H Agbata, Tochukwu, MD   1 mg at 04/12/20 1139   Followed by  . [START ON 04/14/2020] LORazepam (ATIVAN) injection 0-4 mg  0-4 mg Intravenous Q8H Agbata, Tochukwu, MD      . LORazepam (ATIVAN) tablet 1-4 mg  1-4 mg Oral Q1H PRN Collier Bullock, MD  Or  . LORazepam (ATIVAN) injection 1-4 mg  1-4 mg Intravenous Q1H PRN Agbata, Tochukwu, MD      . mometasone-formoterol (DULERA) 100-5 MCG/ACT inhaler 2 puff  2 puff Inhalation BID Agbata, Tochukwu, MD      . morphine 2 MG/ML injection 2 mg  2 mg Intravenous Q4H PRN Agbata, Tochukwu, MD      . ondansetron (ZOFRAN) tablet 4 mg  4 mg Oral Q6H PRN Agbata, Tochukwu, MD       Or  . ondansetron (ZOFRAN) injection 4 mg  4 mg Intravenous Q6H PRN Agbata, Tochukwu, MD      . pantoprazole (PROTONIX) EC tablet 40 mg  40 mg Oral Daily Agbata, Tochukwu, MD   40 mg at 04/12/20 1137  . QUEtiapine (SEROQUEL) tablet 50 mg  50 mg Oral QHS Agbata, Tochukwu, MD      . sodium bicarbonate 150 mEq in dextrose 5% 1000 mL infusion  150 mEq Intravenous Continuous Agbata, Tochukwu, MD 100 mL/hr at 04/12/20 1152 150 mEq at 04/12/20 1152  . sodium chloride 0.9 % 1,000 mL with  thiamine [id number] mg, folic acid 1 mg, multivitamins adult 10 mL infusion   Intravenous Once Agbata, Tochukwu, MD       Current Outpatient Medications  Medication Sig Dispense Refill  . albuterol (VENTOLIN HFA) 108 (90 Base) MCG/ACT inhaler Inhale 2 puffs into the lungs every 6 (six) hours as needed. 6.7 g 1  . ascorbic acid (VITAMIN C) 500 MG tablet Take by mouth.    . cloNIDine (CATAPRES) 0.1 MG tablet Take 0.1 mg by mouth daily.    . colchicine 0.6 MG tablet Take by mouth.    . Fluticasone-Salmeterol (ADVAIR DISKUS) 100-50 MCG/DOSE AEPB Inhale 1 puff into the lungs 2 (two) times daily. [id number] each 0  . folic acid (FOLVITE) 1 MG tablet Take by mouth.    . pantoprazole (PROTONIX) 40 MG tablet Take 1 tablet (40 mg total) by mouth daily. 30 tablet 1  . allopurinol (ZYLOPRIM) 300 MG tablet Take 1 tablet (300 mg total) by mouth daily. 30 tablet 1  . citalopram (CELEXA) 20 MG tablet Take 1 tablet (20 mg total) by mouth daily. 30 tablet 1  . lisinopril (ZESTRIL) 40 MG tablet Take 1 tablet (40 mg total) by mouth daily. 30 tablet 1  . QUEtiapine (SEROQUEL) 50 MG tablet Take 1 tablet (50 mg total) by mouth at bedtime. 30 tablet 1  . thiamine 100 MG tablet Take 1 tablet (100 mg total) by mouth daily. 30 tablet 1      Allergies: Allergies  Allergen Reactions  . Percocet [Oxycodone-Acetaminophen] Nausea And Vomiting      Past Medical History: Past Medical History:  Diagnosis Date  . Alcohol abuse   . Anxiety   . Asthma   . GERD (gastroesophageal reflux disease)   . Gout   . Hypertension   . Kidney stone   . OCD (obsessive compulsive disorder)   . Renal colic      Past Surgical History: Past Surgical History:  Procedure Laterality Date  . CHOLECYSTECTOMY  2012  . EXTRACORPOREAL SHOCK WAVE LITHOTRIPSY Left 01/12/2019   Procedure: EXTRACORPOREAL SHOCK WAVE LITHOTRIPSY (ESWL);  Surgeon: Billey Co, MD;  Location: [facility] ORS;  Service: Urology;  Laterality: Left;     Family  History: Family History  Problem Relation Age of Onset  . Alcohol abuse Father   . Breast cancer Mother 49     Social History: Social History   Socioeconomic History  .  Marital status: Married    Spouse name: Not on file  . Number of children: Not on file  . Years of education: Not on file  . Highest education level: Not on file  Occupational History  . Not on file  Tobacco Use  . Smoking status: Former Smoker    Types: Cigarettes  . Smokeless tobacco: Never Used  . Tobacco comment: quit 30 years ago  Substance and Sexual Activity  . Alcohol use: Not Currently    Alcohol/week: 12.0 standard drinks    Types: 12 Shots of liquor per week    Comment: pint daily  . Drug use: No  . Sexual activity: Not on file  Other Topics Concern  . Not on file  Social History Narrative   Lives at home with his wife and takes care of his wife. Independent at baseline      - Biggest strain is financial but doesn't think they could got get more help.      Patient expressed interest in possible financial assistance. Informed written consent obtained   Social Determinants of Health   Financial Resource Strain:   . Difficulty of Paying Living Expenses:   Food Insecurity:   . Worried About Charity fundraiser in the Last Year:   . Arboriculturist in the Last Year:   Transportation Needs:   . Film/video editor (Medical):   Marland Kitchen Lack of Transportation (Non-Medical):   Physical Activity:   . Days of Exercise per Week:   . Minutes of Exercise per Session:   Stress:   . Feeling of Stress :   Social Connections:   . Frequency of Communication with Friends and Family:   . Frequency of Social Gatherings with Friends and Family:   . Attends Religious Services:   . Active Member of Clubs or Organizations:   . Attends Archivist Meetings:   Marland Kitchen Marital Status:   Intimate Partner Violence:   . Fear of Current or Ex-Partner:   . Emotionally Abused:   Marland Kitchen Physically Abused:   .  Sexually Abused:      Review of Systems: Gen: Denies any fevers or chills HEENT: No vision or hearing complaints CV: No chest pain or shortness of breath Resp: No cough or sputum production.  No hemoptysis GI: Nausea, couple episodes of vomiting, some loose stools, burning sensation with loose stools.  No blood in the stool GU : No blood in urine.  No problems reported with voiding.  Voided clear yellow urine in the emergency room MS: No complaints Derm:    No complaints Psych: History of suicidal attempt last month Heme: No complaints Neuro: No complaints Endocrine.  No complaints  Vital Signs: Blood pressure (!) 171/87, pulse 75, temperature 98.6 F (37 C), temperature source Oral, resp. rate 14, height 6' (1.829 m), weight 83.9 kg, SpO2 96 %.   Intake/Output Summary (Last 24 hours) at 04/12/2020 1216 Last data filed at 04/12/2020 1147 Gross per 24 hour  Intake 1000 ml  Output 450 ml  Net 550 ml    Weight trends: [employer]   04/11/20 2149  Weight: 83.9 kg   Physical Exam: General:  No acute distress, laying in the bed  HEENT  anicteric, moist oral mucous membrane  Pulm/lungs  normal breathing effort, lungs are clear to auscultation  CVS/Heart  regular rhythm, no rub or gallop  Abdomen:   Soft, mild right upper quadrant tenderness with deep respiration  Extremities:  No peripheral edema  Neurologic:  Alert, oriented, able to follow commands  Skin:  No acute rashes       Lab results: Basic Metabolic Panel: Recent Labs  Lab 04/11/20 2156 04/12/20 0813 04/12/20 1119  NA 135 137 135  K 5.2* 4.4 4.8  CL 98 99 101  CO2 18* 18* 17*  GLUCOSE 93 85 84  BUN 83* 81* 80*  CREATININE 18.81* 17.99* 16.64*  CALCIUM 8.6* 9.1 8.6*  MG  --  1.9 1.8  PHOS  --   --  11.8*    Liver Function Tests: Recent Labs  Lab 04/12/20 1119  AST 8*  ALT 11  ALKPHOS 61  BILITOT 0.9  PROT 6.4*  ALBUMIN 3.5   Recent Labs  Lab 04/12/20 0813  LIPASE 80*   No results  for input(s): AMMONIA in the last 168 hours.  CBC: Recent Labs  Lab 04/11/20 2156 04/12/20 1119  WBC 7.9 6.5  HGB 11.7* 11.3*  HCT 35.0* 34.1*  MCV 87.3 87.9  PLT 207 166    Cardiac Enzymes: Recent Labs  Lab 04/12/20 1119  CKTOTAL 17*    BNP: Invalid input(s): POCBNP  CBG: No results for input(s): GLUCAP in the last 168 hours.  Microbiology: Recent Results (from the past 720 hour(s))  Respiratory Panel by RT PCR (Flu A&B, Covid) - Nasopharyngeal Swab     Status: None   Collection Time: 03/13/20  8:30 PM   Specimen: Nasopharyngeal Swab  Result Value Ref Range Status   SARS Coronavirus 2 by RT PCR NEGATIVE NEGATIVE Final    Comment: (NOTE) SARS-CoV-2 target nucleic acids are NOT DETECTED. The SARS-CoV-2 RNA is generally detectable in upper respiratoy specimens during the acute phase of infection. The lowest concentration of SARS-CoV-2 viral copies this assay can detect is 131 copies/mL. A negative result does not preclude SARS-Cov-2 infection and should not be used as the sole basis for treatment or other patient management decisions. A negative result may occur with  improper specimen collection/handling, submission of specimen other than nasopharyngeal swab, presence of viral mutation(s) within the areas targeted by this assay, and inadequate number of viral copies (<131 copies/mL). A negative result must be combined with clinical observations, patient history, and epidemiological information. The expected result is Negative. Fact Sheet for Patients:  PinkCheek.be Fact Sheet for Healthcare Providers:  GravelBags.it This test is not yet ap proved or cleared by the [place] FDA and  has been authorized for detection and/or diagnosis of SARS-CoV-2 by FDA under an Emergency Use Authorization (EUA). This EUA will remain  in effect (meaning this test can be used) for the duration of the COVID-19  declaration under Section 564(b)(1) of the Act, 21 U.S.C. section 360bbb-3(b)(1), unless the authorization is terminated or revoked sooner.    Influenza A by PCR NEGATIVE NEGATIVE Final   Influenza B by PCR NEGATIVE NEGATIVE Final    Comment: (NOTE) The Xpert Xpress SARS-CoV-2/FLU/RSV assay is intended as an aid in  the diagnosis of influenza from Nasopharyngeal swab specimens and  should not be used as a sole basis for treatment. Nasal washings and  aspirates are unacceptable for Xpert Xpress SARS-CoV-2/FLU/RSV  testing. Fact Sheet for Patients: PinkCheek.be Fact Sheet for Healthcare Providers: GravelBags.it This test is not yet approved or cleared by the [place] FDA and  has been authorized for detection and/or diagnosis of SARS-CoV-2 by  FDA under an Emergency Use Authorization (EUA). This EUA will remain  in effect (meaning this test can be used) for the duration  of the  Covid-19 declaration under Section 564(b)(1) of the Act, 21  U.S.C. section 360bbb-3(b)(1), unless the authorization is  terminated or revoked. Performed at [facility], [address] [place], [place] [address]   Respiratory Panel by RT PCR (Flu A&B, Covid) - Nasopharyngeal Swab     Status: None   Collection Time: 03/16/20  8:08 PM   Specimen: Nasopharyngeal Swab  Result Value Ref Range Status   SARS Coronavirus 2 by RT PCR NEGATIVE NEGATIVE Final    Comment: (NOTE) SARS-CoV-2 target nucleic acids are NOT DETECTED. The SARS-CoV-2 RNA is generally detectable in upper respiratoy specimens during the acute phase of infection. The lowest concentration of SARS-CoV-2 viral copies this assay can detect is 131 copies/mL. A negative result does not preclude SARS-Cov-2 infection and should not be used as the sole basis for treatment or other patient management decisions. A negative result may occur with  improper specimen collection/handling,  submission of specimen other than nasopharyngeal swab, presence of viral mutation(s) within the areas targeted by this assay, and inadequate number of viral copies (<131 copies/mL). A negative result must be combined with clinical observations, patient history, and epidemiological information. The expected result is Negative. Fact Sheet for Patients:  PinkCheek.be Fact Sheet for Healthcare Providers:  GravelBags.it This test is not yet ap proved or cleared by the [place] FDA and  has been authorized for detection and/or diagnosis of SARS-CoV-2 by FDA under an Emergency Use Authorization (EUA). This EUA will remain  in effect (meaning this test can be used) for the duration of the COVID-19 declaration under Section 564(b)(1) of the Act, 21 U.S.C. section 360bbb-3(b)(1), unless the authorization is terminated or revoked sooner.    Influenza A by PCR NEGATIVE NEGATIVE Final   Influenza B by PCR NEGATIVE NEGATIVE Final    Comment: (NOTE) The Xpert Xpress SARS-CoV-2/FLU/RSV assay is intended as an aid in  the diagnosis of influenza from Nasopharyngeal swab specimens and  should not be used as a sole basis for treatment. Nasal washings and  aspirates are unacceptable for Xpert Xpress SARS-CoV-2/FLU/RSV  testing. Fact Sheet for Patients: PinkCheek.be Fact Sheet for Healthcare Providers: GravelBags.it This test is not yet approved or cleared by the [place] FDA and  has been authorized for detection and/or diagnosis of SARS-CoV-2 by  FDA under an Emergency Use Authorization (EUA). This EUA will remain  in effect (meaning this test can be used) for the duration of the  Covid-19 declaration under Section 564(b)(1) of the Act, 21  U.S.C. section 360bbb-3(b)(1), unless the authorization is  terminated or revoked. Performed at [facility], [address] [place], [place] [address]   SARS Coronavirus 2 by RT PCR (hospital order, performed in [facility] hospital lab) Nasopharyngeal Nasopharyngeal Swab     Status: None   Collection Time: 04/12/20  9:59 AM   Specimen: Nasopharyngeal Swab  Result Value Ref Range Status   SARS Coronavirus 2 NEGATIVE NEGATIVE Final    Comment: (NOTE) SARS-CoV-2 target nucleic acids are NOT DETECTED. The SARS-CoV-2 RNA is generally detectable in upper and lower respiratory specimens during the acute phase of infection. The lowest concentration of SARS-CoV-2 viral copies this assay can detect is 250 copies / mL. A negative result does not preclude SARS-CoV-2 infection and should not be used as the sole basis for treatment or other patient management decisions.  A negative result may occur with improper specimen collection / handling, submission of specimen other than nasopharyngeal swab, presence of  viral mutation(s) within the areas targeted by this assay, and inadequate number of viral copies (<250 copies / mL). A negative result must be combined with clinical observations, patient history, and epidemiological information. Fact Sheet for Patients:   StrictlyIdeas.no Fact Sheet for Healthcare Providers: BankingDealers.co.za This test is not yet approved or cleared  by the [place] FDA and has been authorized for detection and/or diagnosis of SARS-CoV-2 by FDA under an Emergency Use Authorization (EUA).  This EUA will remain in effect (meaning this test can be used) for the duration of the COVID-19 declaration under Section 564(b)(1) of the Act, 21 U.S.C. section 360bbb-3(b)(1), unless the authorization is terminated or revoked sooner. Performed at [facility], [address] [place], [place] [address]      Coagulation Studies: Recent Labs    04/12/20 0813  LABPROT 13.1  INR 1.0    Urinalysis: No results for input(s): COLORURINE,  LABSPEC, PHURINE, GLUCOSEU, HGBUR, BILIRUBINUR, KETONESUR, PROTEINUR, UROBILINOGEN, NITRITE, LEUKOCYTESUR in the last 72 hours.  Invalid input(s): APPERANCEUR      Imaging: CT Abdomen Pelvis Wo Contrast  Result Date: 04/12/2020 CLINICAL DATA:  Right chest pain, prior cholecystectomy, history of kidney stone EXAM: CT ABDOMEN AND PELVIS WITHOUT CONTRAST TECHNIQUE: Multidetector CT imaging of the abdomen and pelvis was performed following the standard protocol without IV contrast. COMPARISON:  03/03/2020 FINDINGS: Lower chest: Lung bases are clear. Hepatobiliary: Unenhanced liver is unremarkable. Status post cholecystectomy. No intrahepatic or extrahepatic ductal dilatation. Pancreas: Within normal limits. Spleen: Within normal limits. Adrenals/Urinary Tract: Adrenal glands are within normal limits. 5 nonobstructing right renal calculi, measuring up to 4 mm in the right lower pole (series 2/image 39). Four nonobstructing left renal calculi, measuring up to 5 mm in the left upper pole (series 2/image 27). No ureteral or bladder calculi. No hydronephrosis. Bladder is within normal limits. Stomach/Bowel: Tiny hiatal hernia. No evidence of bowel obstruction. Scattered small duodenal diverticuli. Normal appendix (series 2/image 63). Scattered sigmoid diverticulosis, without evidence of diverticulitis. Vascular/Lymphatic: No evidence of abdominal aortic aneurysm. Atherosclerotic calcifications of the abdominal aorta and branch vessels. No suspicious abdominopelvic lymphadenopathy. Reproductive: Mild prostatomegaly. Other: No abdominopelvic ascites. Musculoskeletal: Degenerative changes of the visualized thoracolumbar spine. No rib fracture is seen. IMPRESSION: Multiple bilateral nonobstructing renal calculi measuring up to 5 mm. No ureteral or bladder calculi. No hydronephrosis. No evidence of bowel obstruction.  Normal appendix. Additional ancillary findings as above. Electronically Signed   By: Julian Hy  M.D.   On: 04/12/2020 09:25   DG Chest 2 View  Result Date: 04/11/2020 CLINICAL DATA:  Right chest pain with deep breathing and movement EXAM: CHEST - 2 VIEW COMPARISON:  Radiograph 03/03/2020, CT 03/08/2019 FINDINGS: Some streaky opacities in the lung bases likely reflect atelectasis. Ill-defined opacity in the left lung base appears to reflect the overlap of rib shadows. No consolidation, features of edema, pneumothorax, or effusion. Pulmonary vascularity is normally distributed. The cardiomediastinal contours are unremarkable. No acute osseous or soft tissue abnormality. IMPRESSION: Basilar atelectasis. Otherwise, no acute cardiopulmonary or traumatic findings in the chest. Electronically Signed   By: Lovena Le M.D.   On: 04/11/2020 22:22      Assessment & Plan: Pt is a 59 y.o.   male with  Hypertension Alcohol abuse Substance abuse-benzodiazepines, cannabinoid positive and drug screen May 2021 History of suicidal attempt April 2021 Gout Kidney stones with lithotripsy 2020   was admitted on 04/12/2020 with AKI (acute kidney injury) ([place]) [N17.9]     #Acute kidney injury  Likely related to ATN from volume depletion caused by nausea, vomiting, diarrhea and also taking ibuprofen in setting of volume depleted state.  Baseline creatinine of 1.0/GFR greater than 60 from Mar 16, 2020 Patient is nonoliguric and has started responding to IV fluids. Patient absolutely denies drinking antifreeze.  His mental status is at baseline Alert and oriented.  If he was oliguric or anuric, we would consider emergent dialysis. Electrolytes and volume status are acceptable.  No acute indication for dialysis at present.  Continue IV fluid resuscitation  #Hyperphosphatemia CK level is normal at 17 We will continue to monitor.  Hopefully will improve with IV hydration.  #Alcohol abuse Monitor for withdrawal Patient states that he has learned that alcohol is making him feel bad.  He states that he is not  going to drink anymore.     LOS: 0 Jerrell Mangel 5/28/202112:16 PM    Note: This note was prepared with Dragon dictation. Any transcription errors are unintentional

## 2020-04-13 ENCOUNTER — Encounter: Payer: Self-pay | Admitting: Internal Medicine

## 2020-04-13 ENCOUNTER — Other Ambulatory Visit: Payer: Self-pay

## 2020-04-13 LAB — CBC
HCT: 30.2 % — ABNORMAL LOW (ref 39.0–52.0)
Hemoglobin: 10.2 g/dL — ABNORMAL LOW (ref 13.0–17.0)
MCH: 29.1 pg (ref 26.0–34.0)
MCHC: 33.8 g/dL (ref 30.0–36.0)
MCV: 86.3 fL (ref 80.0–100.0)
Platelets: 162 10*3/uL (ref 150–400)
RBC: 3.5 MIL/uL — ABNORMAL LOW (ref 4.22–5.81)
RDW: 15.5 % (ref 11.5–15.5)
WBC: 5.8 10*3/uL (ref 4.0–10.5)
nRBC: 0 % (ref 0.0–0.2)

## 2020-04-13 LAB — PROTEIN / CREATININE RATIO, URINE
Creatinine, Urine: 74 mg/dL
Protein Creatinine Ratio: 0.12 mg/mg{Cre} (ref 0.00–0.15)
Total Protein, Urine: 9 mg/dL

## 2020-04-13 LAB — BASIC METABOLIC PANEL
Anion gap: 12 (ref 5–15)
BUN: 72 mg/dL — ABNORMAL HIGH (ref 6–20)
CO2: 23 mmol/L (ref 22–32)
Calcium: 8.1 mg/dL — ABNORMAL LOW (ref 8.9–10.3)
Chloride: 102 mmol/L (ref 98–111)
Creatinine, Ser: 14.32 mg/dL — ABNORMAL HIGH (ref 0.61–1.24)
GFR calc Af Amer: 4 mL/min — ABNORMAL LOW (ref >60)
GFR calc non Af Amer: 3 mL/min — ABNORMAL LOW (ref >60)
Glucose, Bld: 115 mg/dL — ABNORMAL HIGH (ref 70–99)
Potassium: 4 mmol/L (ref 3.5–5.1)
Sodium: 137 mmol/L (ref 135–145)

## 2020-04-13 LAB — NA AND K (SODIUM & POTASSIUM), RAND UR
Potassium Urine: 8 mmol/L
Sodium, Ur: 55 mmol/L

## 2020-04-13 MED ORDER — MORPHINE SULFATE 15 MG PO TABS
15.0000 mg | ORAL_TABLET | ORAL | Status: DC | PRN
  Administered 2020-04-13 – 2020-04-19 (×24): 15 mg via ORAL
  Filled 2020-04-13 (×24): qty 1

## 2020-04-13 NOTE — Progress Notes (Signed)
[facility]  ROUNDING NOTE   Subjective:  Patient transition to a floor bed. Creatinine still critically high but down to 14. Urine output was 2 L over the preceding 24 hours. Still on sodium bicarbonate drip.   Objective:  Vital signs in last 24 hours:  Temp:  [97.7 F (36.5 C)] 97.7 F (36.5 C) (05/28 2100) Pulse Rate:  [67-98] 79 (05/29 1100) Resp:  [13-18] 18 (05/29 1100) BP: (106-160)/(61-86) 125/76 (05/29 1100) SpO2:  [92 %-100 %] 94 % (05/29 1100)  Weight change:  Filed Weights   04/11/20 2149  Weight: 83.9 kg    Intake/Output: I/O last 3 completed shifts: In: 1000 [IV Piggyback:1000] Out: 2000 [Urine:2000]   Intake/Output this shift:  Total I/O In: 240 [P.O.:240] Out: 725 [Urine:725]  Physical Exam: General: No acute distress  Head: Normocephalic, atraumatic. Moist oral mucosal membranes  Eyes: Anicteric  Neck: Supple, trachea midline  Lungs:  Clear to auscultation, normal effort  Heart: S1S2 no rubs  Abdomen:  Soft, nontender, bowel sounds present  Extremities: No peripheral edema.  Neurologic: Awake, alert, following commands  Skin: No lesions       Basic Metabolic Panel: Recent Labs  Lab 04/11/20 2156 04/11/20 2156 04/12/20 0813 04/12/20 1119 04/13/20 0446  NA 135  --  137 135 137  K 5.2*  --  4.4 4.8 4.0  CL 98  --  99 101 102  CO2 18*  --  18* 17* 23  GLUCOSE 93  --  85 84 115*  BUN 83*  --  81* 80* 72*  CREATININE 18.81*  --  17.99* 16.64* 14.32*  CALCIUM 8.6*   < > 9.1 8.6* 8.1*  MG  --   --  1.9 1.8  --   PHOS  --   --   --  11.8*  --    < > = values in this interval not displayed.    Liver Function Tests: Recent Labs  Lab 04/12/20 0813 04/12/20 1119  AST 9* 8*  ALT 11 11  ALKPHOS 67 61  BILITOT 1.3* 0.9  PROT 7.6 6.4*  ALBUMIN 4.1 3.5   Recent Labs  Lab 04/12/20 0813  LIPASE 80*   No results for input(s): AMMONIA in the last 168 hours.  CBC: Recent Labs  Lab 04/11/20 2156 04/12/20 1119  04/13/20 0446  WBC 7.9 6.5 5.8  HGB 11.7* 11.3* 10.2*  HCT 35.0* 34.1* 30.2*  MCV 87.3 87.9 86.3  PLT 207 166 162    Cardiac Enzymes: Recent Labs  Lab 04/12/20 1119  CKTOTAL 17*    BNP: Invalid input(s): POCBNP  CBG: No results for input(s): GLUCAP in the last 168 hours.  Microbiology: Results for orders placed or performed during the hospital encounter of 04/12/20  SARS Coronavirus 2 by RT PCR (hospital order, performed in [facility] hospital lab) Nasopharyngeal Nasopharyngeal Swab     Status: None   Collection Time: 04/12/20  9:59 AM   Specimen: Nasopharyngeal Swab  Result Value Ref Range Status   SARS Coronavirus 2 NEGATIVE NEGATIVE Final    Comment: (NOTE) SARS-CoV-2 target nucleic acids are NOT DETECTED. The SARS-CoV-2 RNA is generally detectable in upper and lower respiratory specimens during the acute phase of infection. The lowest concentration of SARS-CoV-2 viral copies this assay can detect is 250 copies / mL. A negative result does not preclude SARS-CoV-2 infection and should not be used as the sole basis for treatment or other patient management decisions.  A negative result may occur  with improper specimen collection / handling, submission of specimen other than nasopharyngeal swab, presence of viral mutation(s) within the areas targeted by this assay, and inadequate number of viral copies (<250 copies / mL). A negative result must be combined with clinical observations, patient history, and epidemiological information. Fact Sheet for Patients:   StrictlyIdeas.no Fact Sheet for Healthcare Providers: BankingDealers.co.za This test is not yet approved or cleared  by the [place] FDA and has been authorized for detection and/or diagnosis of SARS-CoV-2 by FDA under an Emergency Use Authorization (EUA).  This EUA will remain in effect (meaning this test can be used) for the duration of the COVID-19  declaration under Section 564(b)(1) of the Act, 21 U.S.C. section 360bbb-3(b)(1), unless the authorization is terminated or revoked sooner. Performed at [facility], [address] [place], [place] [address]     Coagulation Studies: Recent Labs    04/12/20 0813  LABPROT 13.1  INR 1.0    Urinalysis: Recent Labs    04/12/20 1506  COLORURINE YELLOW*  LABSPEC 1.008  PHURINE 6.0  GLUCOSEU NEGATIVE  HGBUR NEGATIVE  BILIRUBINUR NEGATIVE  KETONESUR 5*  PROTEINUR NEGATIVE  NITRITE NEGATIVE  LEUKOCYTESUR NEGATIVE      Imaging: CT Abdomen Pelvis Wo Contrast  Result Date: 04/12/2020 CLINICAL DATA:  Right chest pain, prior cholecystectomy, history of kidney stone EXAM: CT ABDOMEN AND PELVIS WITHOUT CONTRAST TECHNIQUE: Multidetector CT imaging of the abdomen and pelvis was performed following the standard protocol without IV contrast. COMPARISON:  03/03/2020 FINDINGS: Lower chest: Lung bases are clear. Hepatobiliary: Unenhanced liver is unremarkable. Status post cholecystectomy. No intrahepatic or extrahepatic ductal dilatation. Pancreas: Within normal limits. Spleen: Within normal limits. Adrenals/Urinary Tract: Adrenal glands are within normal limits. 5 nonobstructing right renal calculi, measuring up to 4 mm in the right lower pole (series 2/image 39). Four nonobstructing left renal calculi, measuring up to 5 mm in the left upper pole (series 2/image 27). No ureteral or bladder calculi. No hydronephrosis. Bladder is within normal limits. Stomach/Bowel: Tiny hiatal hernia. No evidence of bowel obstruction. Scattered small duodenal diverticuli. Normal appendix (series 2/image 63). Scattered sigmoid diverticulosis, without evidence of diverticulitis. Vascular/Lymphatic: No evidence of abdominal aortic aneurysm. Atherosclerotic calcifications of the abdominal aorta and branch vessels. No suspicious abdominopelvic lymphadenopathy. Reproductive: Mild prostatomegaly. Other: No  abdominopelvic ascites. Musculoskeletal: Degenerative changes of the visualized thoracolumbar spine. No rib fracture is seen. IMPRESSION: Multiple bilateral nonobstructing renal calculi measuring up to 5 mm. No ureteral or bladder calculi. No hydronephrosis. No evidence of bowel obstruction.  Normal appendix. Additional ancillary findings as above. Electronically Signed   By: Julian Hy M.D.   On: 04/12/2020 09:25   DG Chest 2 View  Result Date: 04/11/2020 CLINICAL DATA:  Right chest pain with deep breathing and movement EXAM: CHEST - 2 VIEW COMPARISON:  Radiograph 03/03/2020, CT 03/08/2019 FINDINGS: Some streaky opacities in the lung bases likely reflect atelectasis. Ill-defined opacity in the left lung base appears to reflect the overlap of rib shadows. No consolidation, features of edema, pneumothorax, or effusion. Pulmonary vascularity is normally distributed. The cardiomediastinal contours are unremarkable. No acute osseous or soft tissue abnormality. IMPRESSION: Basilar atelectasis. Otherwise, no acute cardiopulmonary or traumatic findings in the chest. Electronically Signed   By: Lovena Le M.D.   On: 04/11/2020 22:22   NM PULMONARY VENT AND PERF (V/Q Scan)  Result Date: 04/12/2020 CLINICAL DATA:  Chest pain. EXAM: NUCLEAR MEDICINE VENTILATION - PERFUSION LUNG SCAN TECHNIQUE: Ventilation images were obtained in multiple projections using  inhaled aerosol Tc-17m DTPA. Perfusion images were obtained in multiple projections after intravenous injection of Tc-25m MAA. RADIOPHARMACEUTICALS:  32.65 mCi of Tc-80m DTPA aerosol inhalation and 4.35 mCi Tc54m MAA IV COMPARISON:  Chest radiographs obtained yesterday. FINDINGS: Ventilation: Normal ventilation of both lungs. No focal ventilation defect. Perfusion: Normal perfusion of both lungs. No wedge shaped peripheral perfusion defects to suggest acute pulmonary embolism. IMPRESSION: Normal examination.  No evidence of pulmonary embolism. Electronically  Signed   By: Claudie Revering M.D.   On: 04/12/2020 16:22     Medications:   . sodium bicarbonate 150 mEq in dextrose 5% 1000 mL 150 mEq (04/13/20 0820)   . amLODipine  10 mg Oral Daily  . citalopram  20 mg Oral Daily  . heparin  5,000 Units Subcutaneous Q8H  . LORazepam  0-4 mg Intravenous Q4H   Followed by  . [START ON 04/14/2020] LORazepam  0-4 mg Intravenous Q8H  . mometasone-formoterol  2 puff Inhalation BID  . pantoprazole  40 mg Oral Daily  . QUEtiapine  50 mg Oral QHS   labetalol, LORazepam **OR** LORazepam, morphine injection, ondansetron **OR** ondansetron (ZOFRAN) IV  Assessment/ Plan:  59 y.o. male with past medical history of hypertension, alcohol abuse, substance abuse, gout, nephrolithiasis, suicide attempt 4/21 who was admitted with acute kidney injury.  1.  Acute kidney injury.  Likely secondary to ATN from volume depletion which was secondary to nausea, vomiting, diarrhea, and ibuprofen. -Creatinine still critically high at 14 but is coming down.  Good urine output noted over the preceding 24 hours.  Continue IV fluid hydration.  No urgent indication for dialysis at the moment.  There was some question as to whether there was a toxic alcohol ingestion and volatile's have been drawn however serum osmolar gap was normal making methanol and ethylene glycol ingestion unlikely.  2.  Metabolic acidosis.  Serum bicarbonate now up to 23.  Continue sodium bicarbonate drip.  3.  Hyperphosphatemia.  Serum phosphorus was 11.8 at last check.  Should continue to come down as renal function improves.      LOS: 1 Athaliah Baumbach 5/29/20212:44 PM

## 2020-04-13 NOTE — Progress Notes (Signed)
PROGRESS NOTE    [name]  [id number] DOB: December 06, 1960 DOA: 04/12/2020 PCP: Patient, No Pcp Per    Assessment & Plan:   Principal Problem:   AKI (acute kidney injury) ([employer]) Active Problems:   Alcohol use disorder, severe, dependence ([place])   Abdominal pain   Hypertensive urgency    ILLIAS [name] is a 59 y.o. male with medical history significant for alcohol abuse, hypertension and gout who presents to the emergency room for evaluation of abdominal pain.  Pain is mostly in the right upper quadrant and started 1 day prior to his presentation to the ER.   Acute kidney injury, POA Hyperphosphatemia Metabolic acidosis Etiology unclear. At baseline patient has BUN/CR of 22/ 1.43, on admission his renal function is 81/ 18.  Denied toxic ingestion.  Osm gap 0.  CK wnl.  CT ab/pelvis showed kidney stones but no other acute findings.   --Pt was placed on bicarbonate infusion on admission due to metabolic acidosis PLAN: --Nephrology consult --urine studies --Hold Lisinopril  --continue bicarb gtt  Right flank pain 2/2 nephrolithiasis  --CT a/p showed "Multiple bilateral nonobstructing renal calculi measuring up to 5 mm." --PO morphine PRN for pain  Hypertensive urgency, resolved --continue Amlodipine 10mg  daily (new)  History of alcohol dependence Patient is at increased risk for developing symptoms of alcohol withdrawal --CIWA with lorazepam  Depression Continue Celexa and Seroquel   DVT prophylaxis: Heparin SQ Code Status: Full code  Family Communication:  Status is: inpatient Dispo:   The patient is from: home Anticipated d/c is to: home Anticipated d/c date is: unclear Patient currently is not medically stable to d/c due to: severe AKI, unknown etiology, need further workup   Subjective and Interval History:  Pt complained of pain in his right flank, deep inside, worse with deep inspiration.  Reported good urine output.  No fever, dyspnea, chest pain,  N/V/D, dysuria, hematuria, increased swelling.  Ate all his meals.   Objective: Vitals:   04/13/20 0800 04/13/20 1000 04/13/20 1039 04/13/20 1100  BP: 106/61 133/66 140/75 125/76  Pulse: 75 86 82 79  Resp:  16  18  Temp:      TempSrc:      SpO2: 92% 95%  94%  Weight:      Height:        Intake/Output Summary (Last 24 hours) at 04/13/2020 1500 Last data filed at 04/13/2020 1340 Gross per 24 hour  Intake 240 ml  Output 2025 ml  Net -1785 ml   Filed Weights   04/11/20 2149  Weight: 83.9 kg    Examination:   Constitutional: NAD, AAOx3 HEENT: conjunctivae and lids normal, EOMI CV: RRR no M,R,G. Distal pulses +2.  No cyanosis.   RESP: CTA B/L, normal respiratory effort, on RA GI: +BS, NTND Extremities: No effusions, edema, or tenderness in BLE SKIN: warm, dry and intact Neuro: II - XII grossly intact.  Sensation intact Psych: Normal mood and affect.     Data Reviewed: I have personally reviewed following labs and imaging studies  CBC: Recent Labs  Lab 04/11/20 2156 04/12/20 1119 04/13/20 0446  WBC 7.9 6.5 5.8  HGB 11.7* 11.3* 10.2*  HCT 35.0* 34.1* 30.2*  MCV 87.3 87.9 86.3  PLT 207 166 0000000   Basic Metabolic Panel: Recent Labs  Lab 04/11/20 2156 04/12/20 0813 04/12/20 1119 04/13/20 0446  NA 135 137 135 137  K 5.2* 4.4 4.8 4.0  CL 98 99 101 102  CO2 18* 18* 17* 23  GLUCOSE 93 85 84 115*  BUN 83* 81* 80* 72*  CREATININE 18.81* 17.99* 16.64* 14.32*  CALCIUM 8.6* 9.1 8.6* 8.1*  MG  --  1.9 1.8  --   PHOS  --   --  11.8*  --    GFR: Estimated Creatinine Clearance: 6 mL/min (A) (by C-G formula based on SCr of 14.32 mg/dL (H)). Liver Function Tests: Recent Labs  Lab 04/12/20 0813 04/12/20 1119  AST 9* 8*  ALT 11 11  ALKPHOS 67 61  BILITOT 1.3* 0.9  PROT 7.6 6.4*  ALBUMIN 4.1 3.5   Recent Labs  Lab 04/12/20 0813  LIPASE 80*   No results for input(s): AMMONIA in the last 168 hours. Coagulation Profile: Recent Labs  Lab 04/12/20 0813  INR  1.0   Cardiac Enzymes: Recent Labs  Lab 04/12/20 1119  CKTOTAL 17*   BNP (last 3 results) No results for input(s): PROBNP in the last 8760 hours. HbA1C: No results for input(s): HGBA1C in the last 72 hours. CBG: No results for input(s): GLUCAP in the last 168 hours. Lipid Profile: No results for input(s): CHOL, HDL, LDLCALC, TRIG, CHOLHDL, LDLDIRECT in the last 72 hours. Thyroid Function Tests: No results for input(s): TSH, T4TOTAL, FREET4, T3FREE, THYROIDAB in the last 72 hours. Anemia Panel: No results for input(s): VITAMINB12, FOLATE, FERRITIN, TIBC, IRON, RETICCTPCT in the last 72 hours. Sepsis Labs: No results for input(s): PROCALCITON, LATICACIDVEN in the last 168 hours.  Recent Results (from the past 240 hour(s))  SARS Coronavirus 2 by RT PCR (hospital order, performed in [facility] hospital lab) Nasopharyngeal Nasopharyngeal Swab     Status: None   Collection Time: 04/12/20  9:59 AM   Specimen: Nasopharyngeal Swab  Result Value Ref Range Status   SARS Coronavirus 2 NEGATIVE NEGATIVE Final    Comment: (NOTE) SARS-CoV-2 target nucleic acids are NOT DETECTED. The SARS-CoV-2 RNA is generally detectable in upper and lower respiratory specimens during the acute phase of infection. The lowest concentration of SARS-CoV-2 viral copies this assay can detect is 250 copies / mL. A negative result does not preclude SARS-CoV-2 infection and should not be used as the sole basis for treatment or other patient management decisions.  A negative result may occur with improper specimen collection / handling, submission of specimen other than nasopharyngeal swab, presence of viral mutation(s) within the areas targeted by this assay, and inadequate number of viral copies (<250 copies / mL). A negative result must be combined with clinical observations, patient history, and epidemiological information. Fact Sheet for Patients:   StrictlyIdeas.no Fact Sheet for  Healthcare Providers: BankingDealers.co.za This test is not yet approved or cleared  by the [place] FDA and has been authorized for detection and/or diagnosis of SARS-CoV-2 by FDA under an Emergency Use Authorization (EUA).  This EUA will remain in effect (meaning this test can be used) for the duration of the COVID-19 declaration under Section 564(b)(1) of the Act, 21 U.S.C. section 360bbb-3(b)(1), unless the authorization is terminated or revoked sooner. Performed at [facility], [address] [place],  [address]       Radiology Studies: CT Abdomen Pelvis Wo Contrast  Result Date: 04/12/2020 CLINICAL DATA:  Right chest pain, prior cholecystectomy, history of kidney stone EXAM: CT ABDOMEN AND PELVIS WITHOUT CONTRAST TECHNIQUE: Multidetector CT imaging of the abdomen and pelvis was performed following the standard protocol without IV contrast. COMPARISON:  03/03/2020 FINDINGS: Lower chest: Lung bases are clear. Hepatobiliary: Unenhanced liver is unremarkable. Status post cholecystectomy.  No intrahepatic or extrahepatic ductal dilatation. Pancreas: Within normal limits. Spleen: Within normal limits. Adrenals/Urinary Tract: Adrenal glands are within normal limits. 5 nonobstructing right renal calculi, measuring up to 4 mm in the right lower pole (series 2/image 39). Four nonobstructing left renal calculi, measuring up to 5 mm in the left upper pole (series 2/image 27). No ureteral or bladder calculi. No hydronephrosis. Bladder is within normal limits. Stomach/Bowel: Tiny hiatal hernia. No evidence of bowel obstruction. Scattered small duodenal diverticuli. Normal appendix (series 2/image 63). Scattered sigmoid diverticulosis, without evidence of diverticulitis. Vascular/Lymphatic: No evidence of abdominal aortic aneurysm. Atherosclerotic calcifications of the abdominal aorta and branch vessels. No suspicious abdominopelvic lymphadenopathy. Reproductive:  Mild prostatomegaly. Other: No abdominopelvic ascites. Musculoskeletal: Degenerative changes of the visualized thoracolumbar spine. No rib fracture is seen. IMPRESSION: Multiple bilateral nonobstructing renal calculi measuring up to 5 mm. No ureteral or bladder calculi. No hydronephrosis. No evidence of bowel obstruction.  Normal appendix. Additional ancillary findings as above. Electronically Signed   By: Julian Hy M.D.   On: 04/12/2020 09:25   DG Chest 2 View  Result Date: 04/11/2020 CLINICAL DATA:  Right chest pain with deep breathing and movement EXAM: CHEST - 2 VIEW COMPARISON:  Radiograph 03/03/2020, CT 03/08/2019 FINDINGS: Some streaky opacities in the lung bases likely reflect atelectasis. Ill-defined opacity in the left lung base appears to reflect the overlap of rib shadows. No consolidation, features of edema, pneumothorax, or effusion. Pulmonary vascularity is normally distributed. The cardiomediastinal contours are unremarkable. No acute osseous or soft tissue abnormality. IMPRESSION: Basilar atelectasis. Otherwise, no acute cardiopulmonary or traumatic findings in the chest. Electronically Signed   By: Lovena Le M.D.   On: 04/11/2020 22:22   NM PULMONARY VENT AND PERF (V/Q Scan)  Result Date: 04/12/2020 CLINICAL DATA:  Chest pain. EXAM: NUCLEAR MEDICINE VENTILATION - PERFUSION LUNG SCAN TECHNIQUE: Ventilation images were obtained in multiple projections using inhaled aerosol Tc-71m DTPA. Perfusion images were obtained in multiple projections after intravenous injection of Tc-1m MAA. RADIOPHARMACEUTICALS:  32.65 mCi of Tc-51m DTPA aerosol inhalation and 4.35 mCi Tc73m MAA IV COMPARISON:  Chest radiographs obtained yesterday. FINDINGS: Ventilation: Normal ventilation of both lungs. No focal ventilation defect. Perfusion: Normal perfusion of both lungs. No wedge shaped peripheral perfusion defects to suggest acute pulmonary embolism. IMPRESSION: Normal examination.  No evidence of  pulmonary embolism. Electronically Signed   By: Claudie Revering M.D.   On: 04/12/2020 16:22     Scheduled Meds: . amLODipine  10 mg Oral Daily  . citalopram  20 mg Oral Daily  . heparin  5,000 Units Subcutaneous Q8H  . LORazepam  0-4 mg Intravenous Q4H   Followed by  . [START ON 04/14/2020] LORazepam  0-4 mg Intravenous Q8H  . mometasone-formoterol  2 puff Inhalation BID  . pantoprazole  40 mg Oral Daily  . QUEtiapine  50 mg Oral QHS   Continuous Infusions: . sodium bicarbonate 150 mEq in dextrose 5% 1000 mL 150 mEq (04/13/20 0820)     LOS: 1 day     Enzo Bi, MD [facility] If 7PM-7AM, please contact night-coverage 04/13/2020, 3:00 PM

## 2020-04-13 NOTE — ED Notes (Signed)
Patient placed in hospital bed for comfort.

## 2020-04-14 LAB — BASIC METABOLIC PANEL
Anion gap: 14 (ref 5–15)
BUN: 63 mg/dL — ABNORMAL HIGH (ref 6–20)
CO2: 33 mmol/L — ABNORMAL HIGH (ref 22–32)
Calcium: 8.1 mg/dL — ABNORMAL LOW (ref 8.9–10.3)
Chloride: 96 mmol/L — ABNORMAL LOW (ref 98–111)
Creatinine, Ser: 10.93 mg/dL — ABNORMAL HIGH (ref 0.61–1.24)
GFR calc Af Amer: 5 mL/min — ABNORMAL LOW (ref >60)
GFR calc non Af Amer: 5 mL/min — ABNORMAL LOW (ref >60)
Glucose, Bld: 100 mg/dL — ABNORMAL HIGH (ref 70–99)
Potassium: 3.8 mmol/L (ref 3.5–5.1)
Sodium: 143 mmol/L (ref 135–145)

## 2020-04-14 LAB — CBC
HCT: 30.9 % — ABNORMAL LOW (ref 39.0–52.0)
Hemoglobin: 10.1 g/dL — ABNORMAL LOW (ref 13.0–17.0)
MCH: 28.7 pg (ref 26.0–34.0)
MCHC: 32.7 g/dL (ref 30.0–36.0)
MCV: 87.8 fL (ref 80.0–100.0)
Platelets: 174 10*3/uL (ref 150–400)
RBC: 3.52 MIL/uL — ABNORMAL LOW (ref 4.22–5.81)
RDW: 15.9 % — ABNORMAL HIGH (ref 11.5–15.5)
WBC: 4.8 10*3/uL (ref 4.0–10.5)
nRBC: 0 % (ref 0.0–0.2)

## 2020-04-14 LAB — MAGNESIUM: Magnesium: 1.3 mg/dL — ABNORMAL LOW (ref 1.7–2.4)

## 2020-04-14 LAB — PHOSPHORUS: Phosphorus: 7.3 mg/dL — ABNORMAL HIGH (ref 2.5–4.6)

## 2020-04-14 MED ORDER — LACTATED RINGERS IV SOLN: INTRAVENOUS | Status: DC

## 2020-04-14 MED ORDER — MAGNESIUM SULFATE 2 GM/50ML IV SOLN
2.0000 g | INTRAVENOUS | Status: AC
  Administered 2020-04-14 (×2): 2 g via INTRAVENOUS
  Filled 2020-04-14 (×2): qty 50

## 2020-04-14 MED ORDER — TAMSULOSIN HCL 0.4 MG PO CAPS
0.4000 mg | ORAL_CAPSULE | Freq: Every day | ORAL | Status: DC
  Administered 2020-04-14 – 2020-04-19 (×6): 0.4 mg via ORAL
  Filled 2020-04-14 (×6): qty 1

## 2020-04-14 NOTE — Progress Notes (Signed)
[facility]  ROUNDING NOTE   Subjective:  Urine output was 2.3 L over the preceding 24 hours. Creatinine down to 10.9. Patient appears to be in good spirits today.   Objective:  Vital signs in last 24 hours:  Temp:  [98.5 F (36.9 C)] 98.5 F (36.9 C) (05/30 0000) Pulse Rate:  [79-86] 81 (05/30 0000) Resp:  [10-18] 16 (05/30 0000) BP: (125-140)/(66-76) 125/76 (05/29 1100) SpO2:  [94 %-96 %] 96 % (05/30 0000)  Weight change:  Filed Weights   04/11/20 2149  Weight: 83.9 kg    Intake/Output: I/O last 3 completed shifts: In: 2713 [P.O.:240; I.V.:2473] Out: 3600 [Urine:3600]   Intake/Output this shift:  No intake/output data recorded.  Physical Exam: General: No acute distress  Head: Normocephalic, atraumatic. Moist oral mucosal membranes  Eyes: Anicteric  Neck: Supple, trachea midline  Lungs:  Clear to auscultation, normal effort  Heart: S1S2 no rubs  Abdomen:  Soft, nontender, bowel sounds present  Extremities: No peripheral edema.  Neurologic: Awake, alert, following commands  Skin: No lesions       Basic Metabolic Panel: Recent Labs  Lab 04/11/20 2156 04/11/20 2156 04/12/20 0813 04/12/20 0813 04/12/20 1119 04/13/20 0446 04/14/20 0625  NA 135  --  137  --  135 137 143  K 5.2*  --  4.4  --  4.8 4.0 3.8  CL 98  --  99  --  101 102 96*  CO2 18*  --  18*  --  17* 23 33*  GLUCOSE 93  --  85  --  84 115* 100*  BUN 83*  --  81*  --  80* 72* 63*  CREATININE 18.81*  --  17.99*  --  16.64* 14.32* 10.93*  CALCIUM 8.6*   < > 9.1   < > 8.6* 8.1* 8.1*  MG  --   --  1.9  --  1.8  --  1.3*  PHOS  --   --   --   --  11.8*  --  7.3*   < > = values in this interval not displayed.    Liver Function Tests: Recent Labs  Lab 04/12/20 0813 04/12/20 1119  AST 9* 8*  ALT 11 11  ALKPHOS 67 61  BILITOT 1.3* 0.9  PROT 7.6 6.4*  ALBUMIN 4.1 3.5   Recent Labs  Lab 04/12/20 0813  LIPASE 80*   No results for input(s): AMMONIA in the last 168  hours.  CBC: Recent Labs  Lab 04/11/20 2156 04/12/20 1119 04/13/20 0446 04/14/20 0625  WBC 7.9 6.5 5.8 4.8  HGB 11.7* 11.3* 10.2* 10.1*  HCT 35.0* 34.1* 30.2* 30.9*  MCV 87.3 87.9 86.3 87.8  PLT 207 166 162 174    Cardiac Enzymes: Recent Labs  Lab 04/12/20 1119  CKTOTAL 17*    BNP: Invalid input(s): POCBNP  CBG: No results for input(s): GLUCAP in the last 168 hours.  Microbiology: Results for orders placed or performed during the hospital encounter of 04/12/20  SARS Coronavirus 2 by RT PCR (hospital order, performed in [facility] hospital lab) Nasopharyngeal Nasopharyngeal Swab     Status: None   Collection Time: 04/12/20  9:59 AM   Specimen: Nasopharyngeal Swab  Result Value Ref Range Status   SARS Coronavirus 2 NEGATIVE NEGATIVE Final    Comment: (NOTE) SARS-CoV-2 target nucleic acids are NOT DETECTED. The SARS-CoV-2 RNA is generally detectable in upper and lower respiratory specimens during the acute phase of infection. The lowest concentration of SARS-CoV-2  viral copies this assay can detect is 250 copies / mL. A negative result does not preclude SARS-CoV-2 infection and should not be used as the sole basis for treatment or other patient management decisions.  A negative result may occur with improper specimen collection / handling, submission of specimen other than nasopharyngeal swab, presence of viral mutation(s) within the areas targeted by this assay, and inadequate number of viral copies (<250 copies / mL). A negative result must be combined with clinical observations, patient history, and epidemiological information. Fact Sheet for Patients:   StrictlyIdeas.no Fact Sheet for Healthcare Providers: BankingDealers.co.za This test is not yet approved or cleared  by the [place] FDA and has been authorized for detection and/or diagnosis of SARS-CoV-2 by FDA under an Emergency Use Authorization (EUA).   This EUA will remain in effect (meaning this test can be used) for the duration of the COVID-19 declaration under Section 564(b)(1) of the Act, 21 U.S.C. section 360bbb-3(b)(1), unless the authorization is terminated or revoked sooner. Performed at [facility], [address] [place], [place] [address]     Coagulation Studies: Recent Labs    04/12/20 0813  LABPROT 13.1  INR 1.0    Urinalysis: Recent Labs    04/12/20 1506  COLORURINE YELLOW*  LABSPEC 1.008  PHURINE 6.0  GLUCOSEU NEGATIVE  HGBUR NEGATIVE  BILIRUBINUR NEGATIVE  KETONESUR 5*  PROTEINUR NEGATIVE  NITRITE NEGATIVE  LEUKOCYTESUR NEGATIVE      Imaging: CT Abdomen Pelvis Wo Contrast  Result Date: 04/12/2020 CLINICAL DATA:  Right chest pain, prior cholecystectomy, history of kidney stone EXAM: CT ABDOMEN AND PELVIS WITHOUT CONTRAST TECHNIQUE: Multidetector CT imaging of the abdomen and pelvis was performed following the standard protocol without IV contrast. COMPARISON:  03/03/2020 FINDINGS: Lower chest: Lung bases are clear. Hepatobiliary: Unenhanced liver is unremarkable. Status post cholecystectomy. No intrahepatic or extrahepatic ductal dilatation. Pancreas: Within normal limits. Spleen: Within normal limits. Adrenals/Urinary Tract: Adrenal glands are within normal limits. 5 nonobstructing right renal calculi, measuring up to 4 mm in the right lower pole (series 2/image 39). Four nonobstructing left renal calculi, measuring up to 5 mm in the left upper pole (series 2/image 27). No ureteral or bladder calculi. No hydronephrosis. Bladder is within normal limits. Stomach/Bowel: Tiny hiatal hernia. No evidence of bowel obstruction. Scattered small duodenal diverticuli. Normal appendix (series 2/image 63). Scattered sigmoid diverticulosis, without evidence of diverticulitis. Vascular/Lymphatic: No evidence of abdominal aortic aneurysm. Atherosclerotic calcifications of the abdominal aorta and branch vessels. No  suspicious abdominopelvic lymphadenopathy. Reproductive: Mild prostatomegaly. Other: No abdominopelvic ascites. Musculoskeletal: Degenerative changes of the visualized thoracolumbar spine. No rib fracture is seen. IMPRESSION: Multiple bilateral nonobstructing renal calculi measuring up to 5 mm. No ureteral or bladder calculi. No hydronephrosis. No evidence of bowel obstruction.  Normal appendix. Additional ancillary findings as above. Electronically Signed   By: Julian Hy M.D.   On: 04/12/2020 09:25   NM PULMONARY VENT AND PERF (V/Q Scan)  Result Date: 04/12/2020 CLINICAL DATA:  Chest pain. EXAM: NUCLEAR MEDICINE VENTILATION - PERFUSION LUNG SCAN TECHNIQUE: Ventilation images were obtained in multiple projections using inhaled aerosol Tc-5m DTPA. Perfusion images were obtained in multiple projections after intravenous injection of Tc-79m MAA. RADIOPHARMACEUTICALS:  32.65 mCi of Tc-71m DTPA aerosol inhalation and 4.35 mCi Tc57m MAA IV COMPARISON:  Chest radiographs obtained yesterday. FINDINGS: Ventilation: Normal ventilation of both lungs. No focal ventilation defect. Perfusion: Normal perfusion of both lungs. No wedge shaped peripheral perfusion defects to suggest acute pulmonary embolism. IMPRESSION: Normal examination.  No evidence of pulmonary embolism. Electronically Signed   By: Claudie Revering M.D.   On: 04/12/2020 16:22     Medications:   . sodium bicarbonate 150 mEq in dextrose 5% 1000 mL 150 mEq (04/14/20 [id number])   . amLODipine  10 mg Oral Daily  . citalopram  20 mg Oral Daily  . heparin  5,000 Units Subcutaneous Q8H  . LORazepam  0-4 mg Intravenous Q4H   Followed by  . LORazepam  0-4 mg Intravenous Q8H  . mometasone-formoterol  2 puff Inhalation BID  . pantoprazole  40 mg Oral Daily  . QUEtiapine  50 mg Oral QHS   labetalol, LORazepam **OR** LORazepam, morphine, ondansetron **OR** ondansetron (ZOFRAN) IV  Assessment/ Plan:  59 y.o. male with past medical history of  hypertension, alcohol abuse, substance abuse, gout, nephrolithiasis, suicide attempt 4/21 who was admitted with acute kidney injury.  1.  Acute kidney injury.  Likely secondary to ATN from volume depletion which was secondary to nausea, vomiting, diarrhea, and ibuprofen. -Renal function continues to improve.  Creatinine down to 10.9.  Serum bicarbonate now 33.  Stop sodium bicarbonate infusion.  Transition the patient over to lactated Ringer's.  2.  Metabolic acidosis.  Much improved.  Serum bicarbonate up to 33.  DC sodium bicarbonate drip.  3.  Hyperphosphatemia.  Phosphorus much improved.  Now down to 7.3.  Should come down as renal function continues to improve.      LOS: 2 Arwin Bisceglia 5/30/20218:17 AM

## 2020-04-14 NOTE — Progress Notes (Signed)
PROGRESS NOTE    Miguel [name]  [id number] DOB: 15-Aug-1961 DOA: 04/12/2020 PCP: Patient, No Pcp Per    Assessment & Plan:   Principal Problem:   AKI (acute kidney injury) ([place]) Active Problems:   Alcohol use disorder, severe, dependence ([place])   Abdominal pain   Hypertensive urgency    Miguel [name] is a 59 y.o. male with medical history significant for alcohol abuse, hypertension and gout who presents to the emergency room for evaluation of abdominal pain.  Pain is mostly in the right upper quadrant and started 1 day prior to his presentation to the ER.   Acute kidney injury, POA, improved Hyperphosphatemia, improved Metabolic acidosis, improved --Likely secondary to ATN from volume depletion which was secondary to nausea, vomiting, diarrhea, and ibuprofen. At baseline patient has BUN/CR of 22/ 1.43, on admission his renal function is 81/ 18.  Denied toxic ingestion.  Osm gap 0.  CK wnl.  CT ab/pelvis showed kidney stones but no other acute findings.   --Pt was placed on bicarbonate infusion on admission due to metabolic acidosis --Nephrology consulted PLAN: --Hold Lisinopril  --d/c bicarb gtt --continue MIVF as LR@75   Right flank pain 2/2 nephrolithiasis  --CT a/p showed "Multiple bilateral nonobstructing renal calculi measuring up to 5 mm." --PO morphine PRN for pain --start flomax to help get stones out  Hypertensive urgency, resolved --continue Amlodipine 10mg  daily (new)  History of alcohol dependence Patient is at increased risk for developing symptoms of alcohol withdrawal --CIWA with lorazepam  Depression Continue Celexa and Seroquel   DVT prophylaxis: Heparin SQ Code Status: Full code  Family Communication:  Status is: inpatient Dispo:   The patient is from: home Anticipated d/c is to: home Anticipated d/c date is: 1-2 days Patient currently is not medically stable to d/c due to: severe AKI, Cr still 10, still on IVF.   Subjective and Interval  History:  Still having pain on his right flank, but overall feeling better.  No fever, dyspnea, N/V/D, dysuria, increased swelling.  Good urine output.   Objective: Vitals:   04/13/20 1500 04/14/20 0000 04/14/20 0800 04/14/20 1410  BP:   (!) 143/74 127/74  Pulse:  81 78 74  Resp: 10 16 17 16   Temp:  98.5 F (36.9 C) 98.1 F (36.7 C) 98.2 F (36.8 C)  TempSrc:  Oral Oral Oral  SpO2:  96%  98%  Weight:      Height:        Intake/Output Summary (Last 24 hours) at 04/14/2020 1522 Last data filed at 04/14/2020 1219 Gross per 24 hour  Intake 2712.98 ml  Output 2425 ml  Net 287.98 ml   Filed Weights   04/11/20 2149  Weight: 83.9 kg    Examination:   Constitutional: NAD, AAOx3 HEENT: conjunctivae and lids normal, EOMI CV: RRR no M,R,G. Distal pulses +2.  No cyanosis.   RESP: CTA B/L, normal respiratory effort, on RA GI: +BS, NTND Extremities: No effusions, edema, or tenderness in BLE SKIN: warm, dry and intact Neuro: II - XII grossly intact.  Sensation intact Psych: Normal mood and affect.     Data Reviewed: I have personally reviewed following labs and imaging studies  CBC: Recent Labs  Lab 04/11/20 2156 04/12/20 1119 04/13/20 0446 04/14/20 0625  WBC 7.9 6.5 5.8 4.8  HGB 11.7* 11.3* 10.2* 10.1*  HCT 35.0* 34.1* 30.2* 30.9*  MCV 87.3 87.9 86.3 87.8  PLT 207 166 162 [id number]   Basic Metabolic Panel: Recent Labs  Lab  04/11/20 2156 04/12/20 0813 04/12/20 1119 04/13/20 0446 04/14/20 0625  NA 135 137 135 137 143  K 5.2* 4.4 4.8 4.0 3.8  CL 98 99 101 102 96*  CO2 18* 18* 17* 23 33*  GLUCOSE 93 85 84 115* 100*  BUN 83* 81* 80* 72* 63*  CREATININE 18.81* 17.99* 16.64* 14.32* 10.93*  CALCIUM 8.6* 9.1 8.6* 8.1* 8.1*  MG  --  1.9 1.8  --  1.3*  PHOS  --   --  11.8*  --  7.3*   GFR: Estimated Creatinine Clearance: 7.9 mL/min (A) (by C-G formula based on SCr of 10.93 mg/dL (H)). Liver Function Tests: Recent Labs  Lab 04/12/20 0813 04/12/20 1119  AST 9* 8*    ALT 11 11  ALKPHOS 67 61  BILITOT 1.3* 0.9  PROT 7.6 6.4*  ALBUMIN 4.1 3.5   Recent Labs  Lab 04/12/20 0813  LIPASE 80*   No results for input(s): AMMONIA in the last 168 hours. Coagulation Profile: Recent Labs  Lab 04/12/20 0813  INR 1.0   Cardiac Enzymes: Recent Labs  Lab 04/12/20 1119  CKTOTAL 17*   BNP (last 3 results) No results for input(s): PROBNP in the last 8760 hours. HbA1C: No results for input(s): HGBA1C in the last 72 hours. CBG: No results for input(s): GLUCAP in the last 168 hours. Lipid Profile: No results for input(s): CHOL, HDL, LDLCALC, TRIG, CHOLHDL, LDLDIRECT in the last 72 hours. Thyroid Function Tests: No results for input(s): TSH, T4TOTAL, FREET4, T3FREE, THYROIDAB in the last 72 hours. Anemia Panel: No results for input(s): VITAMINB12, FOLATE, FERRITIN, TIBC, IRON, RETICCTPCT in the last 72 hours. Sepsis Labs: No results for input(s): PROCALCITON, LATICACIDVEN in the last 168 hours.  Recent Results (from the past 240 hour(s))  SARS Coronavirus 2 by RT PCR (hospital order, performed in [facility] hospital lab) Nasopharyngeal Nasopharyngeal Swab     Status: None   Collection Time: 04/12/20  9:59 AM   Specimen: Nasopharyngeal Swab  Result Value Ref Range Status   SARS Coronavirus 2 NEGATIVE NEGATIVE Final    Comment: (NOTE) SARS-CoV-2 target nucleic acids are NOT DETECTED. The SARS-CoV-2 RNA is generally detectable in upper and lower respiratory specimens during the acute phase of infection. The lowest concentration of SARS-CoV-2 viral copies this assay can detect is 250 copies / mL. A negative result does not preclude SARS-CoV-2 infection and should not be used as the sole basis for treatment or other patient management decisions.  A negative result may occur with improper specimen collection / handling, submission of specimen other than nasopharyngeal swab, presence of viral mutation(s) within the areas targeted by this assay, and  inadequate number of viral copies (<250 copies / mL). A negative result must be combined with clinical observations, patient history, and epidemiological information. Fact Sheet for Patients:   StrictlyIdeas.no Fact Sheet for Healthcare Providers: BankingDealers.co.za This test is not yet approved or cleared  by the [place] FDA and has been authorized for detection and/or diagnosis of SARS-CoV-2 by FDA under an Emergency Use Authorization (EUA).  This EUA will remain in effect (meaning this test can be used) for the duration of the COVID-19 declaration under Section 564(b)(1) of the Act, 21 U.S.C. section 360bbb-3(b)(1), unless the authorization is terminated or revoked sooner. Performed at [facility], [address], [place], [place] [address]       Radiology Studies: NM PULMONARY VENT AND PERF (V/Q Scan)  Result Date: 04/12/2020 CLINICAL DATA:  Chest pain. EXAM: NUCLEAR  MEDICINE VENTILATION - PERFUSION LUNG SCAN TECHNIQUE: Ventilation images were obtained in multiple projections using inhaled aerosol Tc-66m DTPA. Perfusion images were obtained in multiple projections after intravenous injection of Tc-76m MAA. RADIOPHARMACEUTICALS:  32.65 mCi of Tc-28m DTPA aerosol inhalation and 4.35 mCi Tc26m MAA IV COMPARISON:  Chest radiographs obtained yesterday. FINDINGS: Ventilation: Normal ventilation of both lungs. No focal ventilation defect. Perfusion: Normal perfusion of both lungs. No wedge shaped peripheral perfusion defects to suggest acute pulmonary embolism. IMPRESSION: Normal examination.  No evidence of pulmonary embolism. Electronically Signed   By: Claudie Revering M.D.   On: 04/12/2020 16:22     Scheduled Meds: . amLODipine  10 mg Oral Daily  . citalopram  20 mg Oral Daily  . heparin  5,000 Units Subcutaneous Q8H  . LORazepam  0-4 mg Intravenous Q8H  . mometasone-formoterol  2 puff Inhalation BID  . pantoprazole  40 mg  Oral Daily  . QUEtiapine  50 mg Oral QHS  . tamsulosin  0.4 mg Oral Daily   Continuous Infusions: . lactated ringers 75 mL/hr at 04/14/20 1028     LOS: 2 days     Enzo Bi, MD [facility] If 7PM-7AM, please contact night-coverage 04/14/2020, 3:22 PM

## 2020-04-15 LAB — CBC
HCT: 30.8 % — ABNORMAL LOW (ref 39.0–52.0)
Hemoglobin: 10.1 g/dL — ABNORMAL LOW (ref 13.0–17.0)
MCH: 29.4 pg (ref 26.0–34.0)
MCHC: 32.8 g/dL (ref 30.0–36.0)
MCV: 89.5 fL (ref 80.0–100.0)
Platelets: 178 10*3/uL (ref 150–400)
RBC: 3.44 MIL/uL — ABNORMAL LOW (ref 4.22–5.81)
RDW: 16.2 % — ABNORMAL HIGH (ref 11.5–15.5)
WBC: 4.8 10*3/uL (ref 4.0–10.5)
nRBC: 0 % (ref 0.0–0.2)

## 2020-04-15 LAB — BASIC METABOLIC PANEL
Anion gap: 11 (ref 5–15)
BUN: 54 mg/dL — ABNORMAL HIGH (ref 6–20)
CO2: 35 mmol/L — ABNORMAL HIGH (ref 22–32)
Calcium: 9 mg/dL (ref 8.9–10.3)
Chloride: 98 mmol/L (ref 98–111)
Creatinine, Ser: 8.64 mg/dL — ABNORMAL HIGH (ref 0.61–1.24)
GFR calc Af Amer: 7 mL/min — ABNORMAL LOW (ref >60)
GFR calc non Af Amer: 6 mL/min — ABNORMAL LOW (ref >60)
Glucose, Bld: 110 mg/dL — ABNORMAL HIGH (ref 70–99)
Potassium: 4.1 mmol/L (ref 3.5–5.1)
Sodium: 144 mmol/L (ref 135–145)

## 2020-04-15 LAB — MAGNESIUM: Magnesium: 1.9 mg/dL (ref 1.7–2.4)

## 2020-04-15 LAB — PHOSPHORUS: Phosphorus: 6.8 mg/dL — ABNORMAL HIGH (ref 2.5–4.6)

## 2020-04-15 MED ORDER — LACTATED RINGERS IV SOLN: INTRAVENOUS | Status: DC

## 2020-04-15 MED ORDER — SODIUM CHLORIDE 0.9 % IV SOLN: INTRAVENOUS | Status: DC

## 2020-04-15 MED ORDER — POLYETHYLENE GLYCOL 3350 17 G PO PACK
17.0000 g | PACK | Freq: Two times a day (BID) | ORAL | Status: DC
  Administered 2020-04-15 – 2020-04-19 (×9): 17 g via ORAL
  Filled 2020-04-15 (×9): qty 1

## 2020-04-15 NOTE — TOC Initial Note (Signed)
Transition of Care [facility]) - Initial/Assessment Note    Patient Details  Name: Miguel [name] MRN: [id number] Date of Birth: 1960/11/19  [facility] CM/SW Contact:    Shelbie Hutching, RN Phone Number: 04/15/2020, 11:05 AM  Clinical Narrative:                 Patient admitted to the hospital for acute kidney injury.  Patient has a history significant for alcohol abuse.  Patient lives at home with his wife who has MS.  He is his wife's primary caregiver, she does have personal care services that come in for 3 hours every day.  Patient drives and is independent in ADL's.  Patient is current with the [facility].  Patient reports that he had an appointment at [facility] tomorrow but he thinks he will still be in the hospital.  Patient reports that he will reschedule his appointment.  Patient has used [facility] services in the past and reports that he will follow up with again.  He says that he has been doing really well with his drinking and has not had a drink since Thursday before last except for 1 beer this past Tuesday.  Patient reports that he does not want to drink any more, he wants to feel better and take care of his wife.   No discharge needs identified at this time.   Expected Discharge Plan: Home/Self Care Barriers to Discharge: Continued Medical Work up   Patient Goals and CMS Choice Patient states their goals for this hospitalization and ongoing recovery are:: Go back home and take care of his wife      Expected Discharge Plan and Services Expected Discharge Plan: Home/Self Care   Discharge Planning Services: CM Consult   Living arrangements for the past 2 months: Single Family Home                                      Prior Living Arrangements/Services Living arrangements for the past 2 months: Single Family Home Lives with:: Spouse Patient language and need for interpreter reviewed:: Yes Do you feel safe going back to the place where you live?: Yes       Need for Family Participation in Patient Care: Yes (Comment) Care giver support system in place?: No (comment)(wife has MS)   Criminal Activity/Legal Involvement Pertinent to Current Situation/Hospitalization: No - Comment as needed  Activities of Daily Living Home Assistive Devices/Equipment: None ADL Screening (condition at time of admission) Patient's cognitive ability adequate to safely complete daily activities?: Yes Is the patient deaf or have difficulty hearing?: No Does the patient have difficulty seeing, even when wearing glasses/contacts?: No Does the patient have difficulty concentrating, remembering, or making decisions?: No Patient able to express need for assistance with ADLs?: Yes Does the patient have difficulty dressing or bathing?: No Independently performs ADLs?: Yes (appropriate for developmental age) Does the patient have difficulty walking or climbing stairs?: No Weakness of Legs: None Weakness of Arms/Hands: None  Permission Sought/Granted Permission sought to share information with : Case Manager Permission granted to share information with : Yes, Verbal Permission Granted              Emotional Assessment Appearance:: Appears stated age Attitude/Demeanor/Rapport: Engaged Affect (typically observed): Accepting Orientation: : Oriented to Self, Oriented to Place, Oriented to  Time, Oriented to Situation Alcohol / Substance Use: Not Applicable Psych Involvement:  No (comment)  Admission diagnosis:  RUQ pain [R10.11] AKI (acute kidney injury) ([place]) [N17.9] Acute renal failure, unspecified acute renal failure type [facility]) [N17.9] Patient Active Problem List   Diagnosis Date Noted  . Abdominal pain 04/12/2020  . Hypertensive urgency 04/12/2020  . Alcohol withdrawal ([place]) 03/04/2020  . Right-sided chest wall pain 02/28/2020  . Alcohol abuse with alcohol-induced mood disorder ([place]) 12/02/2019  . Moderate episode of recurrent major depressive disorder ([place])    . Health care maintenance 10/26/2019  . Right knee pain 10/26/2019  . Generalized anxiety disorder 10/14/2019  . MDD (major depressive disorder), recurrent severe, without psychosis ([place]) 07/27/2019  . Social anxiety disorder 07/27/2019  . Left-sided chest wall pain 06/14/2019  . Alcoholic cirrhosis of liver without ascites ([place]) 06/14/2019  . Alcohol abuse 06/14/2019  . AKI (acute kidney injury) ([place]) 05/27/2019  . Alcohol withdrawal syndrome without complication ([place]) [id number]  . Alcoholic intoxication without complication ([place])   . Sepsis ([place]) 03/08/2019  . Breast lump or mass 10/04/2018  . Sepsis secondary to UTI ([place]) 09/14/2018  . Asthma 07/07/2018  . GERD (gastroesophageal reflux disease) 07/07/2018  . OCD (obsessive compulsive disorder) 07/07/2018  . Self-inflicted laceration of left wrist ([place]) 03/10/2018  . Leg hematoma 12/25/2017  . Suicide and self-inflicted injury by cutting and piercing instrument ([place]) 03/10/2017  . Severe recurrent major 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 ([place]) 03/09/2017  . Substance induced mood disorder ([place]) 08/15/2016  . Involuntary commitment 08/15/2016  . Alcohol use disorder, severe, dependence ([place]) 02/05/2016  . Hypertension 12/05/2015  . Tachycardia 12/05/2015  . Gout 11/13/2015  . Chronic back pain 05/02/2015   PCP:  Patient, No Pcp Per Pharmacy:   [facility] [place], [address] [place] [address] Phone: [phone] Fax: [phone]  [facility] [address]), [place] - [place] [address] Phone: [phone] Fax: [place], [place] - [place] [facility] [place] [address] Phone: [phone] Fax: [phone]  [place], [place] - [place]  [place] [address] Phone: [phone] Fax: [phone]     Social Determinants of Health (SDOH) Interventions    Readmission Risk Interventions Readmission Risk Prevention Plan 05/28/2019 05/08/2019 03/09/2019  Transportation Screening Complete Complete Complete  Medication Review Press photographer) Complete Complete Complete  PCP or Specialist appointment within 3-5 days of discharge - Complete Complete  HRI or Home Care Consult Complete (No Data) Not Complete  HRI or Home Care Consult Pt Refusal Comments - - not indicated   SW Recovery Care/Counseling Consult - - Not Complete  SW Consult Not Complete Comments - - not indicated   Palliative Care Screening Not Applicable Not Applicable Not Applicable  Skilled Nursing Facility Not Applicable Not Applicable Not Applicable  Some recent data might be hidden

## 2020-04-15 NOTE — Progress Notes (Signed)
[facility]  ROUNDING NOTE   Subjective:  Renal function continues to improve. Creatinine down to 8.64. Reports incomplete bladder emptying at times.   Objective:  Vital signs in last 24 hours:  Temp:  [97.4 F (36.3 C)-98.2 F (36.8 C)] 98 F (36.7 C) (05/31 0815) Pulse Rate:  [74-77] 77 (05/31 0016) Resp:  [15-20] 16 (05/31 0815) BP: (116-151)/(67-93) 125/67 (05/31 0815) SpO2:  [90 %-98 %] 94 % (05/31 0815)  Weight change:  Filed Weights   04/11/20 2149  Weight: 83.9 kg    Intake/Output: I/O last 3 completed shifts: In: 240.7 [P.O.:240; I.V.:0.7] Out: 2375 [Urine:2375]   Intake/Output this shift:  Total I/O In: -  Out: 550 [Urine:550]  Physical Exam: General: No acute distress  Head: Normocephalic, atraumatic. Moist oral mucosal membranes  Eyes: Anicteric  Neck: Supple, trachea midline  Lungs:  Clear to auscultation, normal effort  Heart: S1S2 no rubs  Abdomen:  Soft, nontender, bowel sounds present  Extremities: No peripheral edema.  Neurologic: Awake, alert, following commands  Skin: No lesions       Basic Metabolic Panel: Recent Labs  Lab 04/12/20 0813 04/12/20 0813 04/12/20 1119 04/12/20 1119 04/13/20 0446 04/14/20 0625 04/15/20 0516  NA 137  --  135  --  137 143 144  K 4.4  --  4.8  --  4.0 3.8 4.1  CL 99  --  101  --  102 96* 98  CO2 18*  --  17*  --  23 33* 35*  GLUCOSE 85  --  84  --  115* 100* 110*  BUN 81*  --  80*  --  72* 63* 54*  CREATININE 17.99*  --  16.64*  --  14.32* 10.93* 8.64*  CALCIUM 9.1   < > 8.6*   < > 8.1* 8.1* 9.0  MG 1.9  --  1.8  --   --  1.3* 1.9  PHOS  --   --  11.8*  --   --  7.3* 6.8*   < > = values in this interval not displayed.    Liver Function Tests: Recent Labs  Lab 04/12/20 0813 04/12/20 1119  AST 9* 8*  ALT 11 11  ALKPHOS 67 61  BILITOT 1.3* 0.9  PROT 7.6 6.4*  ALBUMIN 4.1 3.5   Recent Labs  Lab 04/12/20 0813  LIPASE 80*   No results for input(s): AMMONIA in the last 168  hours.  CBC: Recent Labs  Lab 04/11/20 2156 04/12/20 1119 04/13/20 0446 04/14/20 0625 04/15/20 0516  WBC 7.9 6.5 5.8 4.8 4.8  HGB 11.7* 11.3* 10.2* 10.1* 10.1*  HCT 35.0* 34.1* 30.2* 30.9* 30.8*  MCV 87.3 87.9 86.3 87.8 89.5  PLT 207 166 162 174 178    Cardiac Enzymes: Recent Labs  Lab 04/12/20 1119  CKTOTAL 17*    BNP: Invalid input(s): POCBNP  CBG: No results for input(s): GLUCAP in the last 168 hours.  Microbiology: Results for orders placed or performed during the hospital encounter of 04/12/20  SARS Coronavirus 2 by RT PCR (hospital order, performed in [facility] hospital lab) Nasopharyngeal Nasopharyngeal Swab     Status: None   Collection Time: 04/12/20  9:59 AM   Specimen: Nasopharyngeal Swab  Result Value Ref Range Status   SARS Coronavirus 2 NEGATIVE NEGATIVE Final    Comment: (NOTE) SARS-CoV-2 target nucleic acids are NOT DETECTED. The SARS-CoV-2 RNA is generally detectable in upper and lower respiratory specimens during the acute phase of infection. The lowest  concentration of SARS-CoV-2 viral copies this assay can detect is 250 copies / mL. A negative result does not preclude SARS-CoV-2 infection and should not be used as the sole basis for treatment or other patient management decisions.  A negative result may occur with improper specimen collection / handling, submission of specimen other than nasopharyngeal swab, presence of viral mutation(s) within the areas targeted by this assay, and inadequate number of viral copies (<250 copies / mL). A negative result must be combined with clinical observations, patient history, and epidemiological information. Fact Sheet for Patients:   StrictlyIdeas.no Fact Sheet for Healthcare Providers: BankingDealers.co.za This test is not yet approved or cleared  by the [place] FDA and has been authorized for detection and/or diagnosis of SARS-CoV-2 by FDA under an  Emergency Use Authorization (EUA).  This EUA will remain in effect (meaning this test can be used) for the duration of the COVID-19 declaration under Section 564(b)(1) of the Act, 21 U.S.C. section 360bbb-3(b)(1), unless the authorization is terminated or revoked sooner. Performed at [facility], [address] [place], [place] [address]     Coagulation Studies: No results for input(s): LABPROT, INR in the last 72 hours.  Urinalysis: Recent Labs    04/12/20 1506  COLORURINE YELLOW*  LABSPEC 1.008  PHURINE 6.0  GLUCOSEU NEGATIVE  HGBUR NEGATIVE  BILIRUBINUR NEGATIVE  KETONESUR 5*  PROTEINUR NEGATIVE  NITRITE NEGATIVE  LEUKOCYTESUR NEGATIVE      Imaging: No results found.   Medications:   . lactated ringers 75 mL/hr at 04/15/20 0915   . amLODipine  10 mg Oral Daily  . citalopram  20 mg Oral Daily  . heparin  5,000 Units Subcutaneous Q8H  . LORazepam  0-4 mg Intravenous Q8H  . mometasone-formoterol  2 puff Inhalation BID  . pantoprazole  40 mg Oral Daily  . polyethylene glycol  17 g Oral BID  . QUEtiapine  50 mg Oral QHS  . tamsulosin  0.4 mg Oral Daily   labetalol, morphine, ondansetron **OR** ondansetron (ZOFRAN) IV  Assessment/ Plan:  59 y.o. male with past medical history of hypertension, alcohol abuse, substance abuse, gout, nephrolithiasis, suicide attempt 4/21 who was admitted with acute kidney injury.  1.  Acute kidney injury.  Likely secondary to ATN from volume depletion which was secondary to nausea, vomiting, diarrhea, and ibuprofen. -Renal function continues to improve slowly.  Creatinine down to 8.64.  Switch IV fluids to 0.9 normal saline as bicarbonate continues to rise.  2.  Metabolic acidosis.  Overall improved.  Serum bicarbonate now 35.  3.  Hyperphosphatemia.  Phosphorus down to 6.8.  Continue to monitor.      LOS: 3 Derriona Branscom 5/31/20211:49 PM

## 2020-04-15 NOTE — Progress Notes (Signed)
PROGRESS NOTE    ANTHONY WAXLER  [id number] DOB: 09-21-1961 DOA: 04/12/2020 PCP: Patient, No Pcp Per    Assessment & Plan:   Principal Problem:   AKI (acute kidney injury) ([place]) Active Problems:   Alcohol use disorder, severe, dependence ([place])   Abdominal pain   Hypertensive urgency    ORRIN SIMMONDS is a 59 y.o. male with medical history significant for alcohol abuse, hypertension and gout who presents to the emergency room for evaluation of abdominal pain.  Pain is mostly in the right upper quadrant and started 1 day prior to his presentation to the ER.   Acute kidney injury, POA, improved Hyperphosphatemia, improved Metabolic acidosis, improved --Likely secondary to ATN from volume depletion which was secondary to nausea, vomiting, diarrhea, and ibuprofen. At baseline patient has BUN/CR of 22/ 1.43, on admission his renal function is 81/ 18.  Denied toxic ingestion.  Osm gap 0.  CK wnl.  CT ab/pelvis showed kidney stones but no other acute findings.   --Pt was placed on bicarbonate infusion on admission due to metabolic acidosis, since d/c'ed --Nephrology consulted PLAN: --Hold Lisinopril  --continue MIVF as LR@75   Right flank pain 2/2 nephrolithiasis  --CT a/p showed "Multiple bilateral nonobstructing renal calculi measuring up to 5 mm." --PO morphine PRN for pain --continue flomax to help get stones out  Hypertensive urgency, resolved --continue Amlodipine 10mg  daily (new)  History of alcohol dependence Patient is at increased risk for developing symptoms of alcohol withdrawal --CIWA with lorazepam  Depression Continue Celexa and Seroquel   DVT prophylaxis: Heparin SQ Code Status: Full code  Family Communication:  Status is: inpatient Dispo:   The patient is from: home Anticipated d/c is to: home Anticipated d/c date is: 1-2 days Patient currently is not medically stable to d/c due to: Cr still quite high ~8, nephrology wants to continue IVF.    Subjective and Interval History:  Pt reported urinary urgency and pink urine at the end of the stream.  Has not had BM since presentation.  Good PO intake.  No fever, dyspnea, chest pain, abdominal pain, N/V/D, dysuria, increased swelling.   Objective: Vitals:   04/14/20 2000 04/15/20 0016 04/15/20 0400 04/15/20 0815  BP: (!) 151/75 116/72 (!) 139/93 125/67  Pulse:  77    Resp: 20 15 19 16   Temp: (!) 97.4 F (36.3 C) 98 F (36.7 C)  98 F (36.7 C)  TempSrc:  Oral  Oral  SpO2: 98% 90%  94%  Weight:      Height:        Intake/Output Summary (Last 24 hours) at 04/15/2020 1446 Last data filed at 04/15/2020 1045 Gross per 24 hour  Intake 0.74 ml  Output 1150 ml  Net -1149.26 ml   Filed Weights   04/11/20 2149  Weight: 83.9 kg    Examination:   Constitutional: NAD, AAOx3 HEENT: conjunctivae and lids normal, EOMI CV: RRR no M,R,G. Distal pulses +2.  No cyanosis.   RESP: CTA B/L, normal respiratory effort, on RA GI: +BS, NTND Extremities: No effusions, edema, or tenderness in BLE SKIN: warm, dry and intact Neuro: II - XII grossly intact.  Sensation intact Psych: Normal mood and affect.     Data Reviewed: I have personally reviewed following labs and imaging studies  CBC: Recent Labs  Lab 04/11/20 2156 04/12/20 1119 04/13/20 0446 04/14/20 0625 04/15/20 0516  WBC 7.9 6.5 5.8 4.8 4.8  HGB 11.7* 11.3* 10.2* 10.1* 10.1*  HCT 35.0* 34.1* 30.2* 30.9* 30.8*  MCV 87.3 87.9 86.3 87.8 89.5  PLT 207 166 162 174 0000000   Basic Metabolic Panel: Recent Labs  Lab 04/12/20 0813 04/12/20 1119 04/13/20 0446 04/14/20 0625 04/15/20 0516  NA 137 135 137 143 144  K 4.4 4.8 4.0 3.8 4.1  CL 99 101 102 96* 98  CO2 18* 17* 23 33* 35*  GLUCOSE 85 84 115* 100* 110*  BUN 81* 80* 72* 63* 54*  CREATININE 17.99* 16.64* 14.32* 10.93* 8.64*  CALCIUM 9.1 8.6* 8.1* 8.1* 9.0  MG 1.9 1.8  --  1.3* 1.9  PHOS  --  11.8*  --  7.3* 6.8*   GFR: Estimated Creatinine Clearance: 10 mL/min (A)  (by C-G formula based on SCr of 8.64 mg/dL (H)). Liver Function Tests: Recent Labs  Lab 04/12/20 0813 04/12/20 1119  AST 9* 8*  ALT 11 11  ALKPHOS 67 61  BILITOT 1.3* 0.9  PROT 7.6 6.4*  ALBUMIN 4.1 3.5   Recent Labs  Lab 04/12/20 0813  LIPASE 80*   No results for input(s): AMMONIA in the last 168 hours. Coagulation Profile: Recent Labs  Lab 04/12/20 0813  INR 1.0   Cardiac Enzymes: Recent Labs  Lab 04/12/20 1119  CKTOTAL 17*   BNP (last 3 results) No results for input(s): PROBNP in the last 8760 hours. HbA1C: No results for input(s): HGBA1C in the last 72 hours. CBG: No results for input(s): GLUCAP in the last 168 hours. Lipid Profile: No results for input(s): CHOL, HDL, LDLCALC, TRIG, CHOLHDL, LDLDIRECT in the last 72 hours. Thyroid Function Tests: No results for input(s): TSH, T4TOTAL, FREET4, T3FREE, THYROIDAB in the last 72 hours. Anemia Panel: No results for input(s): VITAMINB12, FOLATE, FERRITIN, TIBC, IRON, RETICCTPCT in the last 72 hours. Sepsis Labs: No results for input(s): PROCALCITON, LATICACIDVEN in the last 168 hours.  Recent Results (from the past 240 hour(s))  SARS Coronavirus 2 by RT PCR (hospital order, performed in [facility] hospital lab) Nasopharyngeal Nasopharyngeal Swab     Status: None   Collection Time: 04/12/20  9:59 AM   Specimen: Nasopharyngeal Swab  Result Value Ref Range Status   SARS Coronavirus 2 NEGATIVE NEGATIVE Final    Comment: (NOTE) SARS-CoV-2 target nucleic acids are NOT DETECTED. The SARS-CoV-2 RNA is generally detectable in upper and lower respiratory specimens during the acute phase of infection. The lowest concentration of SARS-CoV-2 viral copies this assay can detect is 250 copies / mL. A negative result does not preclude SARS-CoV-2 infection and should not be used as the sole basis for treatment or other patient management decisions.  A negative result may occur with improper specimen collection / handling,  submission of specimen other than nasopharyngeal swab, presence of viral mutation(s) within the areas targeted by this assay, and inadequate number of viral copies (<250 copies / mL). A negative result must be combined with clinical observations, patient history, and epidemiological information. Fact Sheet for Patients:   StrictlyIdeas.no Fact Sheet for Healthcare Providers: BankingDealers.co.za This test is not yet approved or cleared  by the [place] FDA and has been authorized for detection and/or diagnosis of SARS-CoV-2 by FDA under an Emergency Use Authorization (EUA).  This EUA will remain in effect (meaning this test can be used) for the duration of the COVID-19 declaration under Section 564(b)(1) of the Act, 21 U.S.C. section 360bbb-3(b)(1), unless the authorization is terminated or revoked sooner. Performed at [facility], [address], [place], [place] [address]       Radiology Studies: No  results found.   Scheduled Meds: . amLODipine  10 mg Oral Daily  . citalopram  20 mg Oral Daily  . heparin  5,000 Units Subcutaneous Q8H  . LORazepam  0-4 mg Intravenous Q8H  . mometasone-formoterol  2 puff Inhalation BID  . pantoprazole  40 mg Oral Daily  . polyethylene glycol  17 g Oral BID  . QUEtiapine  50 mg Oral QHS  . tamsulosin  0.4 mg Oral Daily   Continuous Infusions: . sodium chloride 75 mL/hr at 04/15/20 1442     LOS: 3 days     Enzo Bi, MD [facility] If 7PM-7AM, please contact night-coverage 04/15/2020, 2:46 PM

## 2020-04-16 ENCOUNTER — Ambulatory Visit: Payer: Medicaid Other | Admitting: Gerontology

## 2020-04-16 LAB — BASIC METABOLIC PANEL
Anion gap: 12 (ref 5–15)
BUN: 45 mg/dL — ABNORMAL HIGH (ref 6–20)
CO2: 31 mmol/L (ref 22–32)
Calcium: 8.9 mg/dL (ref 8.9–10.3)
Chloride: 101 mmol/L (ref 98–111)
Creatinine, Ser: 6.2 mg/dL — ABNORMAL HIGH (ref 0.61–1.24)
GFR calc Af Amer: 10 mL/min — ABNORMAL LOW (ref >60)
GFR calc non Af Amer: 9 mL/min — ABNORMAL LOW (ref >60)
Glucose, Bld: 84 mg/dL (ref 70–99)
Potassium: 4.2 mmol/L (ref 3.5–5.1)
Sodium: 144 mmol/L (ref 135–145)

## 2020-04-16 LAB — PHOSPHORUS: Phosphorus: 4.8 mg/dL — ABNORMAL HIGH (ref 2.5–4.6)

## 2020-04-16 LAB — CBC
HCT: 30.5 % — ABNORMAL LOW (ref 39.0–52.0)
Hemoglobin: 9.7 g/dL — ABNORMAL LOW (ref 13.0–17.0)
MCH: 28.8 pg (ref 26.0–34.0)
MCHC: 31.8 g/dL (ref 30.0–36.0)
MCV: 90.5 fL (ref 80.0–100.0)
Platelets: 198 10*3/uL (ref 150–400)
RBC: 3.37 MIL/uL — ABNORMAL LOW (ref 4.22–5.81)
RDW: 15.9 % — ABNORMAL HIGH (ref 11.5–15.5)
WBC: 4.7 10*3/uL (ref 4.0–10.5)
nRBC: 0 % (ref 0.0–0.2)

## 2020-04-16 LAB — VOLATILES,BLD-ACETONE,ETHANOL,ISOPROP,METHANOL
Acetone, blood: <0.01 g/dL (ref 0.000–0.010)
Ethanol, blood: <0.01 g/dL (ref 0.000–0.010)
Isopropanol, blood: <0.01 g/dL (ref 0.000–0.010)
Methanol, blood: <0.01 g/dL (ref 0.000–0.010)

## 2020-04-16 LAB — MAGNESIUM: Magnesium: 1.6 mg/dL — ABNORMAL LOW (ref 1.7–2.4)

## 2020-04-16 MED ORDER — LORAZEPAM 2 MG/ML IJ SOLN
0.0000 mg | Freq: Three times a day (TID) | INTRAMUSCULAR | Status: AC
  Administered 2020-04-16: 1 mg via INTRAVENOUS

## 2020-04-16 MED ORDER — MAGNESIUM SULFATE 2 GM/50ML IV SOLN
2.0000 g | Freq: Once | INTRAVENOUS | Status: AC
  Administered 2020-04-16: 11:00:00 2 g via INTRAVENOUS
  Filled 2020-04-16: qty 50

## 2020-04-16 NOTE — Progress Notes (Signed)
PROGRESS NOTE    Miguel [name]  [id number] DOB: 15-May-1961 DOA: 04/12/2020 PCP: Patient, No Pcp Per    Assessment & Plan:   Principal Problem:   AKI (acute kidney injury) ([place]) Active Problems:   Alcohol use disorder, severe, dependence ([place])   Abdominal pain   Hypertensive urgency    Miguel [name] is a 59 y.o. Caucasian male with medical history significant for alcohol abuse, hypertension and gout who presents to the emergency room for evaluation of abdominal pain.  Pain is mostly in the right upper quadrant and started 1 day prior to his presentation to the ER.   Acute kidney injury, POA, improved Hyperphosphatemia, improved Metabolic acidosis, improved --Likely secondary to ATN from volume depletion which was secondary to nausea, vomiting, diarrhea, and ibuprofen. At baseline patient has BUN/CR of 22/ 1.43, on admission his renal function is 81/ 18.  Denied toxic ingestion.  Osm gap 0.  CK wnl.  CT ab/pelvis showed kidney stones but no other acute findings.   --Pt was placed on bicarbonate infusion on admission due to metabolic acidosis, since d/c'ed --Nephrology consulted --Cr improved to 6.2 today. PLAN: --Hold Lisinopril  --continue MIVF as LR@75  --discharge after nephrology clears, who still wants to see Cr improve more.  Right flank pain and mild hematuria 2/2 nephrolithiasis  --CT a/p showed "Multiple bilateral nonobstructing renal calculi measuring up to 5 mm." --PO morphine PRN for pain --continue flomax to help get stones out  Hypertensive urgency, resolved --continue Amlodipine 10mg  daily (new)  History of alcohol dependence Patient is at increased risk for developing symptoms of alcohol withdrawal --CIWA with lorazepam  Depression Continue Celexa and Seroquel   DVT prophylaxis: Heparin SQ Code Status: Full code  Family Communication:  Status is: inpatient Dispo:   The patient is from: home Anticipated d/c is to: home Anticipated d/c date is:  1-2 days Patient currently is not medically stable to d/c due to: Cr still quite high ~6, nephrology wants to continue IVF.    Subjective and Interval History:  Pt reported feeling more anxious today, although out of alcohol withdrawal period.  No fever, dyspnea, chest pain, abdominal pain, N/V/D, dysuria.     Objective: Vitals:   04/15/20 1536 04/16/20 0122 04/16/20 0742 04/16/20 0800  BP: 121/66 138/73 129/70 132/65  Pulse: 76 81 89   Resp: 16 16 (!) 22 16  Temp: 98.6 F (37 C) 98.4 F (36.9 C) 98.8 F (37.1 C)   TempSrc: Oral Oral Oral   SpO2: 90% 90% 98%   Weight:      Height:        Intake/Output Summary (Last 24 hours) at 04/16/2020 1513 Last data filed at 04/16/2020 1433 Gross per 24 hour  Intake 933.59 ml  Output 2175 ml  Net -1241.41 ml   Filed Weights   04/11/20 2149  Weight: 83.9 kg    Examination:   Constitutional: NAD, AAOx3 HEENT: conjunctivae and lids normal, EOMI CV: RRR no M,R,G. Distal pulses +2.  No cyanosis.   RESP: CTA B/L, normal respiratory effort, on RA GI: +BS, NTND Extremities: No effusions, edema, or tenderness in BLE SKIN: warm, dry and intact Neuro: II - XII grossly intact.  Sensation intact Psych: Normal mood and affect.     Data Reviewed: I have personally reviewed following labs and imaging studies  CBC: Recent Labs  Lab 04/12/20 1119 04/13/20 0446 04/14/20 0625 04/15/20 0516 04/16/20 0627  WBC 6.5 5.8 4.8 4.8 4.7  HGB 11.3* 10.2* 10.1* 10.1*  9.7*  HCT 34.1* 30.2* 30.9* 30.8* 30.5*  MCV 87.9 86.3 87.8 89.5 90.5  PLT 166 162 174 178 [id number]   Basic Metabolic Panel: Recent Labs  Lab 04/12/20 0813 04/12/20 0813 04/12/20 1119 04/13/20 0446 04/14/20 0625 04/15/20 0516 04/16/20 0627  NA 137   < > 135 137 143 144 144  K 4.4   < > 4.8 4.0 3.8 4.1 4.2  CL 99   < > 101 102 96* 98 101  CO2 18*   < > 17* 23 33* 35* 31  GLUCOSE 85   < > 84 115* 100* 110* 84  BUN 81*   < > 80* 72* 63* 54* 45*  CREATININE 17.99*   < > 16.64*  14.32* 10.93* 8.64* 6.20*  CALCIUM 9.1   < > 8.6* 8.1* 8.1* 9.0 8.9  MG 1.9  --  1.8  --  1.3* 1.9 1.6*  PHOS  --   --  11.8*  --  7.3* 6.8* 4.8*   < > = values in this interval not displayed.   GFR: Estimated Creatinine Clearance: 13.9 mL/min (A) (by C-G formula based on SCr of 6.2 mg/dL (H)). Liver Function Tests: Recent Labs  Lab 04/12/20 0813 04/12/20 1119  AST 9* 8*  ALT 11 11  ALKPHOS 67 61  BILITOT 1.3* 0.9  PROT 7.6 6.4*  ALBUMIN 4.1 3.5   Recent Labs  Lab 04/12/20 0813  LIPASE 80*   No results for input(s): AMMONIA in the last 168 hours. Coagulation Profile: Recent Labs  Lab 04/12/20 0813  INR 1.0   Cardiac Enzymes: Recent Labs  Lab 04/12/20 1119  CKTOTAL 17*   BNP (last 3 results) No results for input(s): PROBNP in the last 8760 hours. HbA1C: No results for input(s): HGBA1C in the last 72 hours. CBG: No results for input(s): GLUCAP in the last 168 hours. Lipid Profile: No results for input(s): CHOL, HDL, LDLCALC, TRIG, CHOLHDL, LDLDIRECT in the last 72 hours. Thyroid Function Tests: No results for input(s): TSH, T4TOTAL, FREET4, T3FREE, THYROIDAB in the last 72 hours. Anemia Panel: No results for input(s): VITAMINB12, FOLATE, FERRITIN, TIBC, IRON, RETICCTPCT in the last 72 hours. Sepsis Labs: No results for input(s): PROCALCITON, LATICACIDVEN in the last 168 hours.  Recent Results (from the past 240 hour(s))  SARS Coronavirus 2 by RT PCR (hospital order, performed in [facility] hospital lab) Nasopharyngeal Nasopharyngeal Swab     Status: None   Collection Time: 04/12/20  9:59 AM   Specimen: Nasopharyngeal Swab  Result Value Ref Range Status   SARS Coronavirus 2 NEGATIVE NEGATIVE Final    Comment: (NOTE) SARS-CoV-2 target nucleic acids are NOT DETECTED. The SARS-CoV-2 RNA is generally detectable in upper and lower respiratory specimens during the acute phase of infection. The lowest concentration of SARS-CoV-2 viral copies this assay can detect  is 250 copies / mL. A negative result does not preclude SARS-CoV-2 infection and should not be used as the sole basis for treatment or other patient management decisions.  A negative result may occur with improper specimen collection / handling, submission of specimen other than nasopharyngeal swab, presence of viral mutation(s) within the areas targeted by this assay, and inadequate number of viral copies (<250 copies / mL). A negative result must be combined with clinical observations, patient history, and epidemiological information. Fact Sheet for Patients:   StrictlyIdeas.no Fact Sheet for Healthcare Providers: BankingDealers.co.za This test is not yet approved or cleared  by the [place] FDA and has been authorized for  detection and/or diagnosis of SARS-CoV-2 by FDA under an Emergency Use Authorization (EUA).  This EUA will remain in effect (meaning this test can be used) for the duration of the COVID-19 declaration under Section 564(b)(1) of the Act, 21 U.S.C. section 360bbb-3(b)(1), unless the authorization is terminated or revoked sooner. Performed at [facility], [address], [place], [place] [address]       Radiology Studies: No results found.   Scheduled Meds: . amLODipine  10 mg Oral Daily  . citalopram  20 mg Oral Daily  . heparin  5,000 Units Subcutaneous Q8H  . LORazepam  0-4 mg Intravenous Q8H  . mometasone-formoterol  2 puff Inhalation BID  . pantoprazole  40 mg Oral Daily  . polyethylene glycol  17 g Oral BID  . QUEtiapine  50 mg Oral QHS  . tamsulosin  0.4 mg Oral Daily   Continuous Infusions: . sodium chloride 75 mL/hr at 04/16/20 0330     LOS: 4 days     Enzo Bi, MD [facility] If 7PM-7AM, please contact night-coverage 04/16/2020, 3:13 PM

## 2020-04-16 NOTE — Progress Notes (Signed)
[facility]  ROUNDING NOTE   Subjective:  Patient continues to have good urine output. Urine output was 1.9 L over the preceding 24 hours. Creatinine down to 6.2. Still on IV fluids.   Objective:  Vital signs in last 24 hours:  Temp:  [98.4 F (36.9 C)-98.8 F (37.1 C)] 98.8 F (37.1 C) (06/01 0742) Pulse Rate:  [76-89] 89 (06/01 0742) Resp:  [16-22] 16 (06/01 0800) BP: (121-138)/(65-73) 132/65 (06/01 0800) SpO2:  [90 %-98 %] 98 % (06/01 0742)  Weight change:  Filed Weights   04/11/20 2149  Weight: 83.9 kg    Intake/Output: I/O last 3 completed shifts: In: 454.3 [I.V.:454.3] Out: 2575 [Urine:2575]   Intake/Output this shift:  Total I/O In: 240 [P.O.:240] Out: 750 [Urine:750]  Physical Exam: General: No acute distress  Head: Normocephalic, atraumatic. Moist oral mucosal membranes  Eyes: Anicteric  Neck: Supple, trachea midline  Lungs:  Clear to auscultation, normal effort  Heart: S1S2 no rubs  Abdomen:  Soft, nontender, bowel sounds present  Extremities: No peripheral edema.  Neurologic: Awake, alert, following commands  Skin: No lesions       Basic Metabolic Panel: Recent Labs  Lab 04/12/20 0813 04/12/20 0813 04/12/20 1119 04/12/20 1119 04/13/20 0446 04/13/20 0446 04/14/20 0625 04/15/20 0516 04/16/20 0627  NA 137   < > 135  --  137  --  143 144 144  K 4.4   < > 4.8  --  4.0  --  3.8 4.1 4.2  CL 99   < > 101  --  102  --  96* 98 101  CO2 18*   < > 17*  --  23  --  33* 35* 31  GLUCOSE 85   < > 84  --  115*  --  100* 110* 84  BUN 81*   < > 80*  --  72*  --  63* 54* 45*  CREATININE 17.99*   < > 16.64*  --  14.32*  --  10.93* 8.64* 6.20*  CALCIUM 9.1   < > 8.6*   < > 8.1*   < > 8.1* 9.0 8.9  MG 1.9  --  1.8  --   --   --  1.3* 1.9 1.6*  PHOS  --   --  11.8*  --   --   --  7.3* 6.8* 4.8*   < > = values in this interval not displayed.    Liver Function Tests: Recent Labs  Lab 04/12/20 0813 04/12/20 1119  AST 9* 8*  ALT 11 11   ALKPHOS 67 61  BILITOT 1.3* 0.9  PROT 7.6 6.4*  ALBUMIN 4.1 3.5   Recent Labs  Lab 04/12/20 0813  LIPASE 80*   No results for input(s): AMMONIA in the last 168 hours.  CBC: Recent Labs  Lab 04/12/20 1119 04/13/20 0446 04/14/20 0625 04/15/20 0516 04/16/20 0627  WBC 6.5 5.8 4.8 4.8 4.7  HGB 11.3* 10.2* 10.1* 10.1* 9.7*  HCT 34.1* 30.2* 30.9* 30.8* 30.5*  MCV 87.9 86.3 87.8 89.5 90.5  PLT 166 162 174 178 198    Cardiac Enzymes: Recent Labs  Lab 04/12/20 1119  CKTOTAL 17*    BNP: Invalid input(s): POCBNP  CBG: No results for input(s): GLUCAP in the last 168 hours.  Microbiology: Results for orders placed or performed during the hospital encounter of 04/12/20  SARS Coronavirus 2 by RT PCR (hospital order, performed in [facility] hospital lab) Nasopharyngeal Nasopharyngeal Swab  Status: None   Collection Time: 04/12/20  9:59 AM   Specimen: Nasopharyngeal Swab  Result Value Ref Range Status   SARS Coronavirus 2 NEGATIVE NEGATIVE Final    Comment: (NOTE) SARS-CoV-2 target nucleic acids are NOT DETECTED. The SARS-CoV-2 RNA is generally detectable in upper and lower respiratory specimens during the acute phase of infection. The lowest concentration of SARS-CoV-2 viral copies this assay can detect is 250 copies / mL. A negative result does not preclude SARS-CoV-2 infection and should not be used as the sole basis for treatment or other patient management decisions.  A negative result may occur with improper specimen collection / handling, submission of specimen other than nasopharyngeal swab, presence of viral mutation(s) within the areas targeted by this assay, and inadequate number of viral copies (<250 copies / mL). A negative result must be combined with clinical observations, patient history, and epidemiological information. Fact Sheet for Patients:   StrictlyIdeas.no Fact Sheet for Healthcare  Providers: BankingDealers.co.za This test is not yet approved or cleared  by the [place] FDA and has been authorized for detection and/or diagnosis of SARS-CoV-2 by FDA under an Emergency Use Authorization (EUA).  This EUA will remain in effect (meaning this test can be used) for the duration of the COVID-19 declaration under Section 564(b)(1) of the Act, 21 U.S.C. section 360bbb-3(b)(1), unless the authorization is terminated or revoked sooner. Performed at [facility], [address] [place], [place] [address]     Coagulation Studies: No results for input(s): LABPROT, INR in the last 72 hours.  Urinalysis: No results for input(s): COLORURINE, LABSPEC, PHURINE, GLUCOSEU, HGBUR, BILIRUBINUR, KETONESUR, PROTEINUR, UROBILINOGEN, NITRITE, LEUKOCYTESUR in the last 72 hours.  Invalid input(s): APPERANCEUR    Imaging: No results found.   Medications:   . sodium chloride 75 mL/hr at 04/16/20 0330  . magnesium sulfate bolus IVPB     . amLODipine  10 mg Oral Daily  . citalopram  20 mg Oral Daily  . heparin  5,000 Units Subcutaneous Q8H  . LORazepam  0-4 mg Intravenous Q8H  . mometasone-formoterol  2 puff Inhalation BID  . pantoprazole  40 mg Oral Daily  . polyethylene glycol  17 g Oral BID  . QUEtiapine  50 mg Oral QHS  . tamsulosin  0.4 mg Oral Daily   labetalol, morphine, ondansetron **OR** ondansetron (ZOFRAN) IV  Assessment/ Plan:  59 y.o. male with past medical history of hypertension, alcohol abuse, substance abuse, gout, nephrolithiasis, suicide attempt 4/21 who was admitted with acute kidney injury.  1.  Acute kidney injury.  Likely secondary to ATN from volume depletion which was secondary to nausea, vomiting, diarrhea, and ibuprofen. -Creatinine down to 6.2.  Urine output was 1.9 L over the preceding 24 hours.  While admitted with a continue IV fluid hydration with 0.9 normal saline.  Would like to see the creatinine come down  just a bit more prior to discharge.  2.  Metabolic acidosis.  Serum bicarbonate currently 31 and acceptable.  3.  Hyperphosphatemia.  Phosphorus now down to 4.8 which is a good sign.  Continue to monitor bone mineral metabolism parameters.      LOS: 4 Nilton Lave 6/1/202110:31 AM

## 2020-04-17 LAB — CBC
HCT: 31.4 % — ABNORMAL LOW (ref 39.0–52.0)
Hemoglobin: 9.7 g/dL — ABNORMAL LOW (ref 13.0–17.0)
MCH: 28.8 pg (ref 26.0–34.0)
MCHC: 30.9 g/dL (ref 30.0–36.0)
MCV: 93.2 fL (ref 80.0–100.0)
Platelets: 214 10*3/uL (ref 150–400)
RBC: 3.37 MIL/uL — ABNORMAL LOW (ref 4.22–5.81)
RDW: 15.5 % (ref 11.5–15.5)
WBC: 4.6 10*3/uL (ref 4.0–10.5)
nRBC: 0 % (ref 0.0–0.2)

## 2020-04-17 LAB — BASIC METABOLIC PANEL
Anion gap: 10 (ref 5–15)
BUN: 39 mg/dL — ABNORMAL HIGH (ref 6–20)
CO2: 31 mmol/L (ref 22–32)
Calcium: 9.1 mg/dL (ref 8.9–10.3)
Chloride: 105 mmol/L (ref 98–111)
Creatinine, Ser: 4.58 mg/dL — ABNORMAL HIGH (ref 0.61–1.24)
GFR calc Af Amer: 15 mL/min — ABNORMAL LOW (ref >60)
GFR calc non Af Amer: 13 mL/min — ABNORMAL LOW (ref >60)
Glucose, Bld: 92 mg/dL (ref 70–99)
Potassium: 3.9 mmol/L (ref 3.5–5.1)
Sodium: 146 mmol/L — ABNORMAL HIGH (ref 135–145)

## 2020-04-17 LAB — PHOSPHORUS: Phosphorus: 3.8 mg/dL (ref 2.5–4.6)

## 2020-04-17 LAB — MAGNESIUM: Magnesium: 1.9 mg/dL (ref 1.7–2.4)

## 2020-04-17 NOTE — Progress Notes (Signed)
[facility]  ROUNDING NOTE   Subjective:  Good urine output of 1.3 L noted over the preceding 24 hours. Creatinine now down to 4.6. Patient still feeling a bit nauseous. Still generally weak as well.  Objective:  Vital signs in last 24 hours:  Temp:  [97.6 F (36.4 C)-97.8 F (36.6 C)] 97.6 F (36.4 C) (06/02 0800) Pulse Rate:  [76-78] 78 (06/01 2308) Resp:  [16-17] 17 (06/02 0800) BP: (128-154)/(69-79) 154/76 (06/02 0800) SpO2:  [91 %-92 %] 92 % (06/01 2308)  Weight change:  Filed Weights   04/11/20 2149  Weight: 83.9 kg    Intake/Output: I/O last 3 completed shifts: In: 2712.5 [P.O.:480; I.V.:2232.5] Out: 2650 [Urine:2650]   Intake/Output this shift:  Total I/O In: 240 [P.O.:240] Out: 1100 [Urine:1100]  Physical Exam: General: No acute distress  Head: Normocephalic, atraumatic. Moist oral mucosal membranes  Eyes: Anicteric  Neck: Supple, trachea midline  Lungs:  Clear to auscultation, normal effort  Heart: S1S2 no rubs  Abdomen:  Soft, nontender, bowel sounds present  Extremities: No peripheral edema.  Neurologic: Awake, alert, following commands  Skin: No lesions       Basic Metabolic Panel: Recent Labs  Lab 04/12/20 1119 04/12/20 1119 04/13/20 0446 04/13/20 0446 04/14/20 0625 04/14/20 0625 04/15/20 0516 04/16/20 0627 04/17/20 0615  NA 135   < > 137  --  143  --  144 144 146*  K 4.8   < > 4.0  --  3.8  --  4.1 4.2 3.9  CL 101   < > 102  --  96*  --  98 101 105  CO2 17*   < > 23  --  33*  --  35* 31 31  GLUCOSE 84   < > 115*  --  100*  --  110* 84 92  BUN 80*   < > 72*  --  63*  --  54* 45* 39*  CREATININE 16.64*   < > 14.32*  --  10.93*  --  8.64* 6.20* 4.58*  CALCIUM 8.6*   < > 8.1*   < > 8.1*   < > 9.0 8.9 9.1  MG 1.8  --   --   --  1.3*  --  1.9 1.6* 1.9  PHOS 11.8*  --   --   --  7.3*  --  6.8* 4.8* 3.8   < > = values in this interval not displayed.    Liver Function Tests: Recent Labs  Lab 04/12/20 0813 04/12/20 1119   AST 9* 8*  ALT 11 11  ALKPHOS 67 61  BILITOT 1.3* 0.9  PROT 7.6 6.4*  ALBUMIN 4.1 3.5   Recent Labs  Lab 04/12/20 0813  LIPASE 80*   No results for input(s): AMMONIA in the last 168 hours.  CBC: Recent Labs  Lab 04/13/20 0446 04/14/20 0625 04/15/20 0516 04/16/20 0627 04/17/20 0615  WBC 5.8 4.8 4.8 4.7 4.6  HGB 10.2* 10.1* 10.1* 9.7* 9.7*  HCT 30.2* 30.9* 30.8* 30.5* 31.4*  MCV 86.3 87.8 89.5 90.5 93.2  PLT 162 174 178 198 214    Cardiac Enzymes: Recent Labs  Lab 04/12/20 1119  CKTOTAL 17*    BNP: Invalid input(s): POCBNP  CBG: No results for input(s): GLUCAP in the last 168 hours.  Microbiology: Results for orders placed or performed during the hospital encounter of 04/12/20  SARS Coronavirus 2 by RT PCR (hospital order, performed in [facility] hospital lab) Nasopharyngeal Nasopharyngeal Swab  Status: None   Collection Time: 04/12/20  9:59 AM   Specimen: Nasopharyngeal Swab  Result Value Ref Range Status   SARS Coronavirus 2 NEGATIVE NEGATIVE Final    Comment: (NOTE) SARS-CoV-2 target nucleic acids are NOT DETECTED. The SARS-CoV-2 RNA is generally detectable in upper and lower respiratory specimens during the acute phase of infection. The lowest concentration of SARS-CoV-2 viral copies this assay can detect is 250 copies / mL. A negative result does not preclude SARS-CoV-2 infection and should not be used as the sole basis for treatment or other patient management decisions.  A negative result may occur with improper specimen collection / handling, submission of specimen other than nasopharyngeal swab, presence of viral mutation(s) within the areas targeted by this assay, and inadequate number of viral copies (<250 copies / mL). A negative result must be combined with clinical observations, patient history, and epidemiological information. Fact Sheet for Patients:   StrictlyIdeas.no Fact Sheet for Healthcare  Providers: BankingDealers.co.za This test is not yet approved or cleared  by the [place] FDA and has been authorized for detection and/or diagnosis of SARS-CoV-2 by FDA under an Emergency Use Authorization (EUA).  This EUA will remain in effect (meaning this test can be used) for the duration of the COVID-19 declaration under Section 564(b)(1) of the Act, 21 U.S.C. section 360bbb-3(b)(1), unless the authorization is terminated or revoked sooner. Performed at [facility], [address] [place], [place] [address]     Coagulation Studies: No results for input(s): LABPROT, INR in the last 72 hours.  Urinalysis: No results for input(s): COLORURINE, LABSPEC, PHURINE, GLUCOSEU, HGBUR, BILIRUBINUR, KETONESUR, PROTEINUR, UROBILINOGEN, NITRITE, LEUKOCYTESUR in the last 72 hours.  Invalid input(s): APPERANCEUR    Imaging: No results found.   Medications:   . sodium chloride 75 mL/hr at 04/17/20 0608   . amLODipine  10 mg Oral Daily  . citalopram  20 mg Oral Daily  . heparin  5,000 Units Subcutaneous Q8H  . LORazepam  0-4 mg Intravenous Q8H  . mometasone-formoterol  2 puff Inhalation BID  . pantoprazole  40 mg Oral Daily  . polyethylene glycol  17 g Oral BID  . QUEtiapine  50 mg Oral QHS  . tamsulosin  0.4 mg Oral Daily   labetalol, morphine, ondansetron **OR** ondansetron (ZOFRAN) IV  Assessment/ Plan:  59 y.o. male with past medical history of hypertension, alcohol abuse, substance abuse, gout, nephrolithiasis, suicide attempt 4/21 who was admitted with acute kidney injury.  1.  Acute kidney injury.  Likely secondary to ATN from volume depletion which was secondary to nausea, vomiting, diarrhea, and ibuprofen. -Creatinine does appear to be trending down appropriately and currently down to 4.58 with urine output of 1.3 L over the last 24 hours.  Continue hydration.  2.  Metabolic acidosis.  Resolved..  3.  Hyperphosphatemia.  Phosphorus  now normalized at 3.8.  Continue to periodically monitor.      LOS: 5 Asma Boldon 6/2/20211:02 PM

## 2020-04-17 NOTE — Progress Notes (Signed)
PROGRESS NOTE    Miguel [name]  [id number] DOB: 1961-10-30 DOA: 04/12/2020 PCP: Patient, No Pcp Per    Assessment & Plan:   Principal Problem:   AKI (acute kidney injury) ([place]) Active Problems:   Alcohol use disorder, severe, dependence ([place])   Abdominal pain   Hypertensive urgency   AKI: likely secondary to ATN from volume depletion which was secondary to nausea, vomiting, diarrhea, and ibuprofen. At baseline Cr 1.43. CT ab/pelvis showed kidney stones but no other acute findings. Continue to hold lisinopril. Continue on IVFs. Nephro following and recs apprec  Metabolic acidosis: resolved  Hyperphosphatemia: resolved   Hematuria: secondary to nephrolithiasis. CT showed multiple bilateral nonobstructing renal calculi measuring up to 5 mm. Morphine PRN for pain. Continue on flomax   Hypertensive urgency: resolved. Continue amlodipine   History of alcohol dependence: increased risk for developing symptoms of alcohol withdrawal. Continue w/ CIWA   Depression: severity unknown. Continue celexa and seroquel   DVT prophylaxis: heparin SQ Code Status: full  Family Communication:  Disposition Plan: likely to d/c back home   Status is: Inpatient  Remains inpatient appropriate because:IV treatments appropriate due to intensity of illness or inability to take PO   Dispo: The patient is from: Home              Anticipated d/c is to: Home              Anticipated d/c date is: 2 days              Patient currently is not medically stable to d/c.    Consultants:   nephro   Procedures:   Antimicrobials:   Subjective: Pt c/o intermittent abd pain   Objective: Vitals:   04/16/20 0800 04/16/20 1600 04/16/20 2307 04/16/20 2308  BP: 132/65 128/69 (!) 151/79   Pulse:   76 78  Resp: 16 17 16    Temp:   97.8 F (36.6 C)   TempSrc:   Oral   SpO2:   91% 92%  Weight:      Height:        Intake/Output Summary (Last 24 hours) at 04/17/2020 0734 Last data filed at  04/17/2020 0416 Gross per 24 hour  Intake 2712.47 ml  Output 1350 ml  Net 1362.47 ml   Filed Weights   04/11/20 2149  Weight: 83.9 kg    Examination:  General exam: Appears calm and comfortable  Respiratory system: decreased breath sounds b/l Cardiovascular system: S1 & S2+. No rubs, gallops or clicks.  Gastrointestinal system: Abdomen is nondistended, soft and nontender. Normal bowel sounds heard. Central nervous system: Alert and oriented. Moves all 4 extremities  Psychiatry: Judgement and insight appear normal. Mood & affect appropriate.     Data Reviewed: I have personally reviewed following labs and imaging studies  CBC: Recent Labs  Lab 04/13/20 0446 04/14/20 0625 04/15/20 0516 04/16/20 0627 04/17/20 0615  WBC 5.8 4.8 4.8 4.7 4.6  HGB 10.2* 10.1* 10.1* 9.7* 9.7*  HCT 30.2* 30.9* 30.8* 30.5* 31.4*  MCV 86.3 87.8 89.5 90.5 93.2  PLT 162 174 178 198 [id number]   Basic Metabolic Panel: Recent Labs  Lab 04/12/20 1119 04/12/20 1119 04/13/20 0446 04/14/20 0625 04/15/20 0516 04/16/20 0627 04/17/20 0615  NA 135   < > 137 143 144 144 146*  K 4.8   < > 4.0 3.8 4.1 4.2 3.9  CL 101   < > 102 96* 98 101 105  CO2 17*   < > 23  33* 35* 31 31  GLUCOSE 84   < > 115* 100* 110* 84 92  BUN 80*   < > 72* 63* 54* 45* 39*  CREATININE 16.64*   < > 14.32* 10.93* 8.64* 6.20* 4.58*  CALCIUM 8.6*   < > 8.1* 8.1* 9.0 8.9 9.1  MG 1.8  --   --  1.3* 1.9 1.6* 1.9  PHOS 11.8*  --   --  7.3* 6.8* 4.8* 3.8   < > = values in this interval not displayed.   GFR: Estimated Creatinine Clearance: 18.8 mL/min (A) (by C-G formula based on SCr of 4.58 mg/dL (H)). Liver Function Tests: Recent Labs  Lab 04/12/20 0813 04/12/20 1119  AST 9* 8*  ALT 11 11  ALKPHOS 67 61  BILITOT 1.3* 0.9  PROT 7.6 6.4*  ALBUMIN 4.1 3.5   Recent Labs  Lab 04/12/20 0813  LIPASE 80*   No results for input(s): AMMONIA in the last 168 hours. Coagulation Profile: Recent Labs  Lab 04/12/20 0813  INR 1.0    Cardiac Enzymes: Recent Labs  Lab 04/12/20 1119  CKTOTAL 17*   BNP (last 3 results) No results for input(s): PROBNP in the last 8760 hours. HbA1C: No results for input(s): HGBA1C in the last 72 hours. CBG: No results for input(s): GLUCAP in the last 168 hours. Lipid Profile: No results for input(s): CHOL, HDL, LDLCALC, TRIG, CHOLHDL, LDLDIRECT in the last 72 hours. Thyroid Function Tests: No results for input(s): TSH, T4TOTAL, FREET4, T3FREE, THYROIDAB in the last 72 hours. Anemia Panel: No results for input(s): VITAMINB12, FOLATE, FERRITIN, TIBC, IRON, RETICCTPCT in the last 72 hours. Sepsis Labs: No results for input(s): PROCALCITON, LATICACIDVEN in the last 168 hours.  Recent Results (from the past 240 hour(s))  SARS Coronavirus 2 by RT PCR (hospital order, performed in [facility] hospital lab) Nasopharyngeal Nasopharyngeal Swab     Status: None   Collection Time: 04/12/20  9:59 AM   Specimen: Nasopharyngeal Swab  Result Value Ref Range Status   SARS Coronavirus 2 NEGATIVE NEGATIVE Final    Comment: (NOTE) SARS-CoV-2 target nucleic acids are NOT DETECTED. The SARS-CoV-2 RNA is generally detectable in upper and lower respiratory specimens during the acute phase of infection. The lowest concentration of SARS-CoV-2 viral copies this assay can detect is 250 copies / mL. A negative result does not preclude SARS-CoV-2 infection and should not be used as the sole basis for treatment or other patient management decisions.  A negative result may occur with improper specimen collection / handling, submission of specimen other than nasopharyngeal swab, presence of viral mutation(s) within the areas targeted by this assay, and inadequate number of viral copies (<250 copies / mL). A negative result must be combined with clinical observations, patient history, and epidemiological information. Fact Sheet for Patients:   StrictlyIdeas.no Fact Sheet for  Healthcare Providers: BankingDealers.co.za This test is not yet approved or cleared  by the [place] FDA and has been authorized for detection and/or diagnosis of SARS-CoV-2 by FDA under an Emergency Use Authorization (EUA).  This EUA will remain in effect (meaning this test can be used) for the duration of the COVID-19 declaration under Section 564(b)(1) of the Act, 21 U.S.C. section 360bbb-3(b)(1), unless the authorization is terminated or revoked sooner. Performed at [facility], [address] [place], [place] [address]          Radiology Studies: No results found.      Scheduled Meds: . amLODipine  10 mg Oral Daily  .  citalopram  20 mg Oral Daily  . heparin  5,000 Units Subcutaneous Q8H  . LORazepam  0-4 mg Intravenous Q8H  . mometasone-formoterol  2 puff Inhalation BID  . pantoprazole  40 mg Oral Daily  . polyethylene glycol  17 g Oral BID  . QUEtiapine  50 mg Oral QHS  . tamsulosin  0.4 mg Oral Daily   Continuous Infusions: . sodium chloride 75 mL/hr at 04/17/20 [id number]     LOS: 5 days    Time spent: 34 mins     Wyvonnia Dusky, MD [facility] Pager [phone]  If 7PM-7AM, please contact night-coverage [email or web address] 04/17/2020, 7:34 AM

## 2020-04-18 DIAGNOSIS — I10 Essential (primary) hypertension: Secondary | ICD-10-CM

## 2020-04-18 LAB — CBC
HCT: 31.6 % — ABNORMAL LOW (ref 39.0–52.0)
Hemoglobin: 10.1 g/dL — ABNORMAL LOW (ref 13.0–17.0)
MCH: 29.1 pg (ref 26.0–34.0)
MCHC: 32 g/dL (ref 30.0–36.0)
MCV: 91.1 fL (ref 80.0–100.0)
Platelets: 231 10*3/uL (ref 150–400)
RBC: 3.47 MIL/uL — ABNORMAL LOW (ref 4.22–5.81)
RDW: 15.2 % (ref 11.5–15.5)
WBC: 4.6 10*3/uL (ref 4.0–10.5)
nRBC: 0 % (ref 0.0–0.2)

## 2020-04-18 LAB — MAGNESIUM: Magnesium: 1.5 mg/dL — ABNORMAL LOW (ref 1.7–2.4)

## 2020-04-18 LAB — BASIC METABOLIC PANEL
Anion gap: 11 (ref 5–15)
BUN: 31 mg/dL — ABNORMAL HIGH (ref 6–20)
CO2: 28 mmol/L (ref 22–32)
Calcium: 9.1 mg/dL (ref 8.9–10.3)
Chloride: 106 mmol/L (ref 98–111)
Creatinine, Ser: 3.53 mg/dL — ABNORMAL HIGH (ref 0.61–1.24)
GFR calc Af Amer: 21 mL/min — ABNORMAL LOW (ref >60)
GFR calc non Af Amer: 18 mL/min — ABNORMAL LOW (ref >60)
Glucose, Bld: 90 mg/dL (ref 70–99)
Potassium: 4 mmol/L (ref 3.5–5.1)
Sodium: 145 mmol/L (ref 135–145)

## 2020-04-18 LAB — PHOSPHORUS: Phosphorus: 3.4 mg/dL (ref 2.5–4.6)

## 2020-04-18 IMAGING — CT CT RENAL STONE PROTOCOL
2 of 4 series · 15 of 46 positions shown, 17 images · non-contrast
Comparison: CT abdomen pelvis 06/03/2019

CLINICAL DATA: Hematuria, unknown cause

EXAM:
CT ABDOMEN AND PELVIS WITHOUT CONTRAST
TECHNIQUE: Multidetector CT imaging of the abdomen and pelvis was performed
following the standard protocol without IV contrast.

[Series 2: stone full standard · axial · 0.77mm/px · z∈[-896,-490]mm · 12 of 93 slices shown, 14 images]
[im 8/93  soft-tissue]
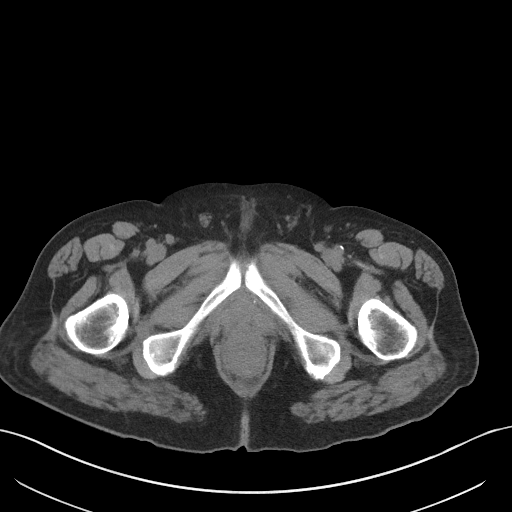
[im 8/93  bone]
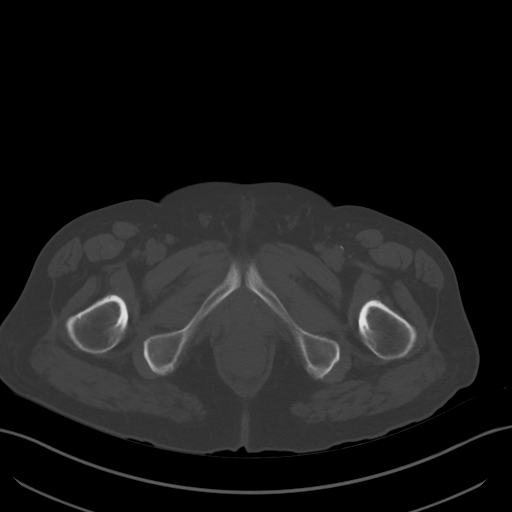
[im 15/93  soft-tissue]
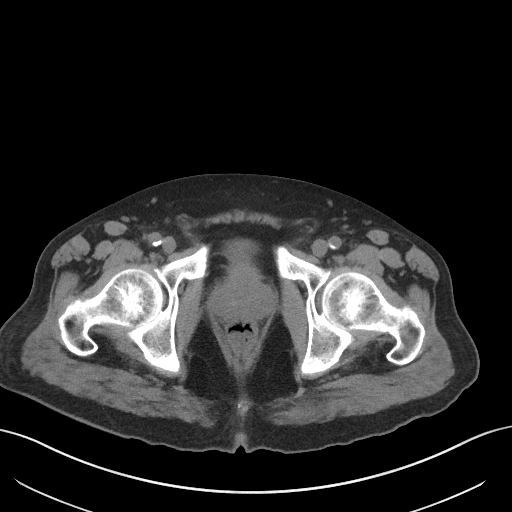
[im 23/93  soft-tissue]
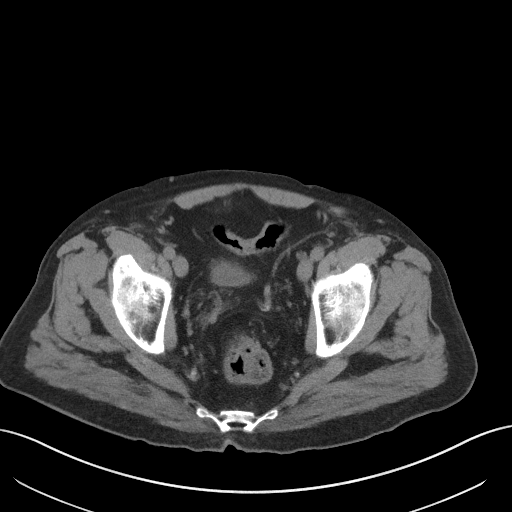
[im 30/93  soft-tissue]
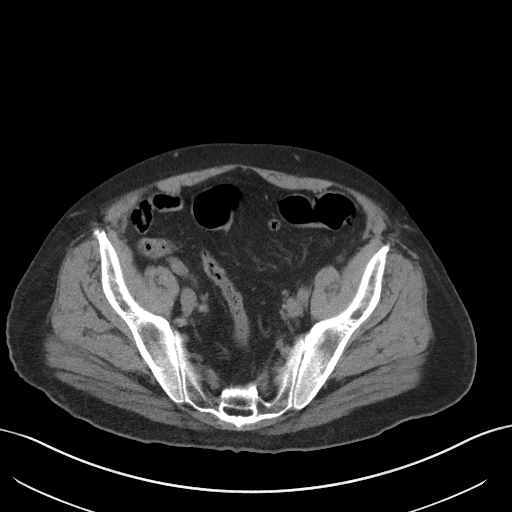
[im 37/93  soft-tissue]
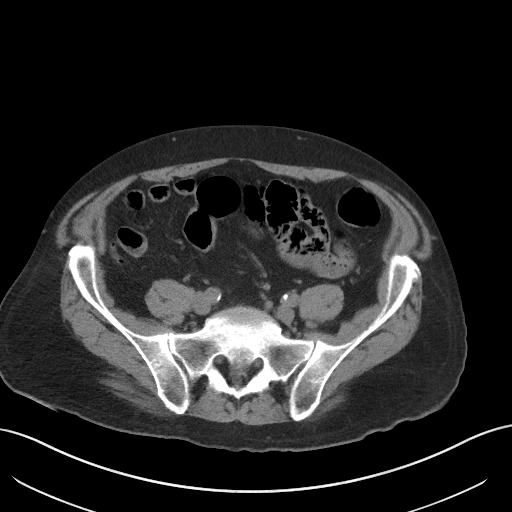
[im 45/93  soft-tissue]
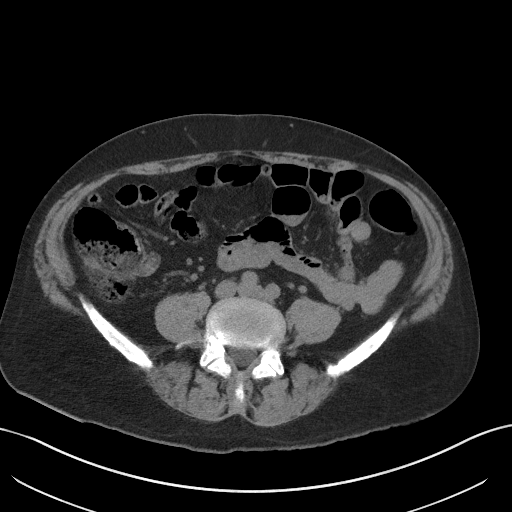
[im 52/93  soft-tissue]
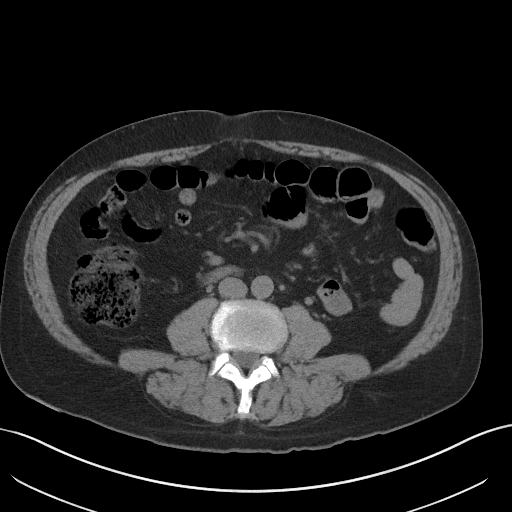
[im 59/93  soft-tissue]
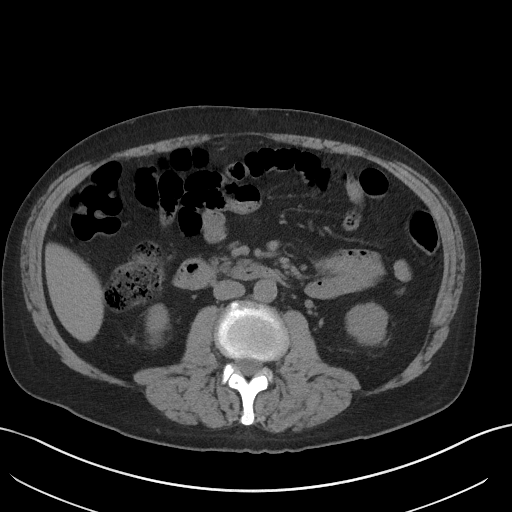
[im 67/93  soft-tissue]
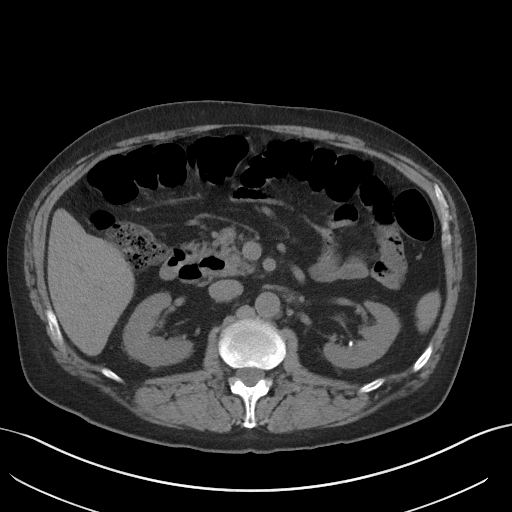
[im 67/93  bone]
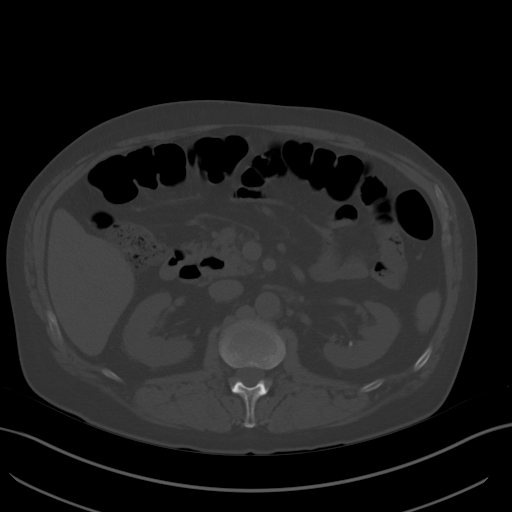
[im 74/93  soft-tissue]
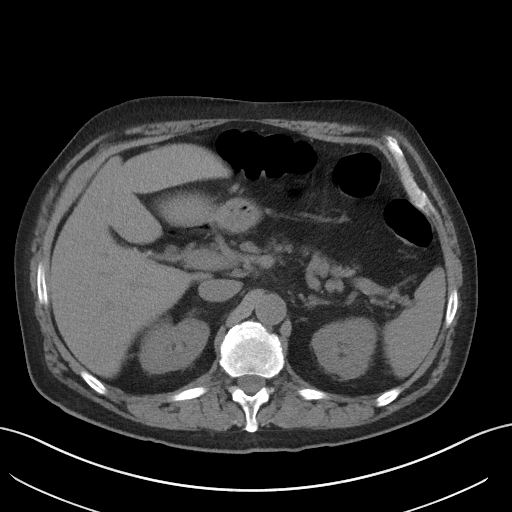
[im 81/93  soft-tissue]
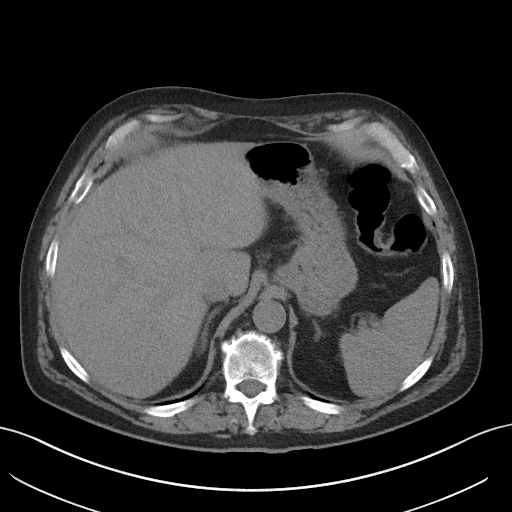
[im 89/93  soft-tissue]
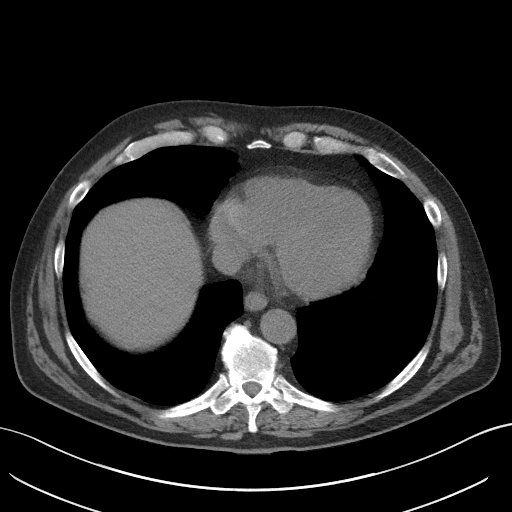

[Series 5: coronal · coronal · 0.77mm/px · 3 of 144 slices shown]
[im 48/144  soft-tissue]
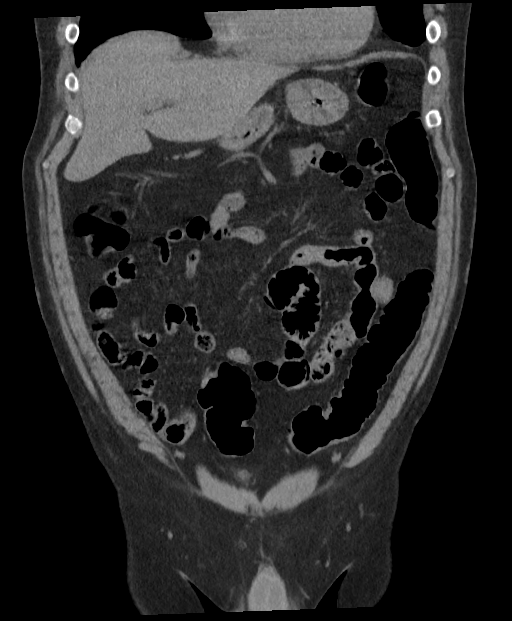
[im 64/144  soft-tissue]
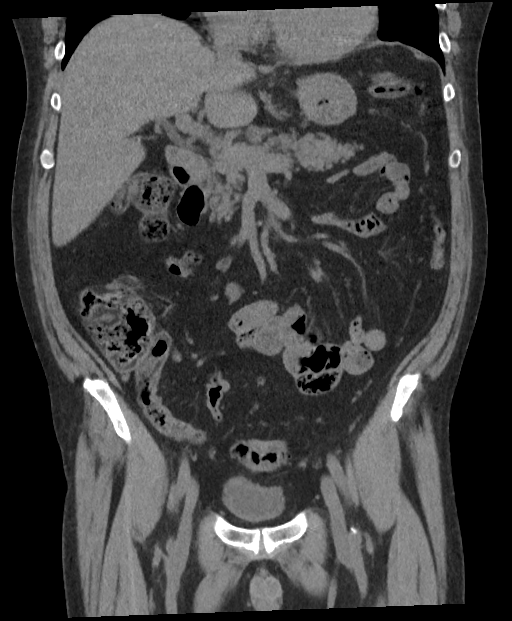
[im 80/144  soft-tissue]
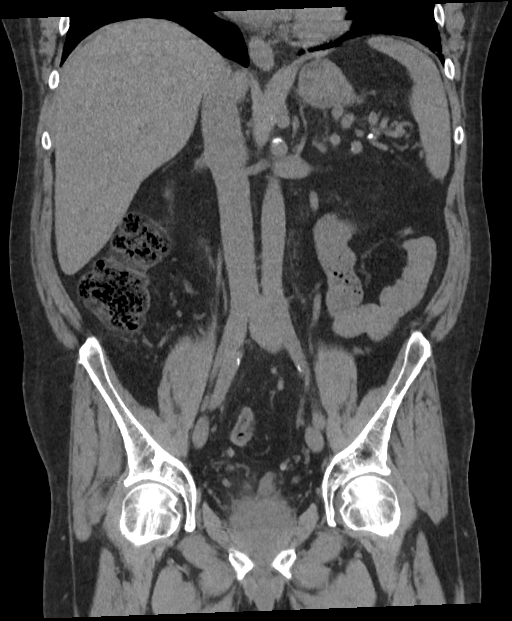

[15 of 46 positions shown; findings below may reference images not displayed]

FINDINGS: Lower chest: Lung bases are clear. Normal heart size. No pericardial
effusion.

Hepatobiliary: No focal liver abnormality. Gallbladder is not
visualized, likely surgically absent. No biliary ductal dilatation.
No calcified intraductal gallstones

Pancreas: Partial fatty replacement of the pancreas. No visible
pancreatic lesions or ductal dilatation.

Spleen: Normal in size without focal abnormality.

Adrenals/Urinary Tract: Normal adrenal glands. Mild bilateral
nonspecific perinephric stranding, a nonspecific finding though may
correlate with either age or decreased renal function. Bilateral
nonobstructing nephrolithiasis with multiple punctate renal calculi
in both kidney. No visible or contour deforming renal lesions.
Ureters are normal course and caliber. There is mild bladder wall
thickening though this may be related to underdistention.

Stomach/Bowel: Distal esophagus and stomach are unremarkable. Few to
air-filled duodenal diverticula. No small bowel dilatation or wall
thickening. A normal appendix is visualized. No colonic dilatation
or wall thickening. Scattered colonic diverticula without focal
pericolonic inflammation to suggest diverticulitis.

Vascular/Lymphatic: Atherosclerotic plaque within the normal caliber
aorta. No suspicious or enlarged lymph nodes in the included
lymphatic chains.

Reproductive: Mild prostatomegaly with median lobe hypertrophy. No
abnormality of the seminal vesicles.

Other: No abdominopelvic free fluid or free gas. No bowel containing
hernias. Small fat containing umbilical hernia.

Musculoskeletal: Multilevel degenerative changes are present in the
imaged portions of the spine. No acute osseous abnormality or
suspicious osseous lesion.
IMPRESSION: 1. Bilateral nonobstructing nephrolithiasis. No obstructive
urolithiasis or hydronephrosis.
2. Mild bilateral nonspecific perinephric stranding, a nonspecific
finding though may correlate with either age or decreased renal
function. Similar to comparison.
3. Mild prostatomegaly with median hypertrophy.
4. Mild circumferential bladder wall thickening, possibly related to
underdistention though should correlate with urinalysis to exclude
cystitis.
5. Aortic Atherosclerosis ([id number]).

## 2020-04-18 MED ORDER — MAGNESIUM SULFATE 2 GM/50ML IV SOLN
2.0000 g | Freq: Once | INTRAVENOUS | Status: AC
  Administered 2020-04-18: 2 g via INTRAVENOUS
  Filled 2020-04-18: qty 50

## 2020-04-18 NOTE — Progress Notes (Signed)
PROGRESS NOTE    Miguel [name]  [id number] DOB: Feb 06, 1961 DOA: 04/12/2020 PCP: Patient, No Pcp Per    Assessment & Plan:   Principal Problem:   AKI (acute kidney injury) ([place]) Active Problems:   Alcohol use disorder, severe, dependence ([place])   Abdominal pain   Hypertensive urgency   AKI: likely secondary to ATN from volume depletion which was secondary to nausea, vomiting, diarrhea, and ibuprofen. At baseline Cr 1.43. CT ab/pelvis showed kidney stones but no other acute findings. Continue to hold lisinopril. Continue on IVFs. Cr continues to trend down daily. Nephro following and recs apprec  Generalized weakness: PT consulted   Metabolic acidosis: resolved  Hyperphosphatemia: resolved   Hematuria: secondary to nephrolithiasis. CT showed multiple bilateral nonobstructing renal calculi measuring up to 5 mm. Morphine PRN for pain. Continue on flomax. Improving   Hypertensive urgency: resolved. Continue amlodipine   History of alcohol dependence: increased risk for developing symptoms of alcohol withdrawal. Continue w/ CIWA   Depression: severity unknown. Continue celexa and seroquel   DVT prophylaxis: heparin SQ Code Status: full  Family Communication:  Disposition Plan: likely to d/c back home   Status is: Inpatient  Remains inpatient appropriate because:IV treatments appropriate due to intensity of illness or inability to take PO. Pt c/o nausea & generalized weakness. PT was consulted   Dispo: The patient is from: Home              Anticipated d/c is to: Home              Anticipated d/c date is: 1 day               Patient currently is not medically stable to d/c.    Consultants:   nephro   Procedures:   Antimicrobials:   Subjective: Pt c/o nausea & generalized weakness   Objective: Vitals:   04/16/20 2307 04/16/20 2308 04/17/20 0800 04/17/20 2329  BP: (!) 151/79  (!) 154/76 (!) 145/94  Pulse: 76 78  68  Resp: 16  17 17   Temp: 97.8 F  (36.6 C)  97.6 F (36.4 C) 97.8 F (36.6 C)  TempSrc: Oral  Oral Oral  SpO2: 91% 92%  92%  Weight:      Height:        Intake/Output Summary (Last 24 hours) at 04/18/2020 0718 Last data filed at 04/18/2020 0523 Gross per 24 hour  Intake 240 ml  Output 2000 ml  Net -1760 ml   Filed Weights   04/11/20 2149  Weight: 83.9 kg    Examination:  General exam: Appears calm and comfortable  Respiratory system: diminished breath sounds b/l. No wheezes Cardiovascular system: S1 & S2+. No rubs, gallops or clicks.  Gastrointestinal system: Abdomen is nondistended, soft and nontender. Normal bowel sounds heard. Central nervous system: Alert and oriented. Moves all 4 extremities  Psychiatry: Judgement and insight appear normal. Flat mood and affect    Data Reviewed: I have personally reviewed following labs and imaging studies  CBC: Recent Labs  Lab 04/14/20 0625 04/15/20 0516 04/16/20 0627 04/17/20 0615 04/18/20 0500  WBC 4.8 4.8 4.7 4.6 4.6  HGB 10.1* 10.1* 9.7* 9.7* 10.1*  HCT 30.9* 30.8* 30.5* 31.4* 31.6*  MCV 87.8 89.5 90.5 93.2 91.1  PLT 174 178 198 214 [id number]   Basic Metabolic Panel: Recent Labs  Lab 04/14/20 0625 04/15/20 0516 04/16/20 0627 04/17/20 0615 04/18/20 0500  NA 143 144 144 146* 145  K 3.8 4.1 4.2 3.9 4.0  CL 96* 98 101 105 106  CO2 33* 35* 31 31 28   GLUCOSE 100* 110* 84 92 90  BUN 63* 54* 45* 39* 31*  CREATININE 10.93* 8.64* 6.20* 4.58* 3.53*  CALCIUM 8.1* 9.0 8.9 9.1 9.1  MG 1.3* 1.9 1.6* 1.9 1.5*  PHOS 7.3* 6.8* 4.8* 3.8 3.4   GFR: Estimated Creatinine Clearance: 24.4 mL/min (A) (by C-G formula based on SCr of 3.53 mg/dL (H)). Liver Function Tests: Recent Labs  Lab 04/12/20 0813 04/12/20 1119  AST 9* 8*  ALT 11 11  ALKPHOS 67 61  BILITOT 1.3* 0.9  PROT 7.6 6.4*  ALBUMIN 4.1 3.5   Recent Labs  Lab 04/12/20 0813  LIPASE 80*   No results for input(s): AMMONIA in the last 168 hours. Coagulation Profile: Recent Labs  Lab  04/12/20 0813  INR 1.0   Cardiac Enzymes: Recent Labs  Lab 04/12/20 1119  CKTOTAL 17*   BNP (last 3 results) No results for input(s): PROBNP in the last 8760 hours. HbA1C: No results for input(s): HGBA1C in the last 72 hours. CBG: No results for input(s): GLUCAP in the last 168 hours. Lipid Profile: No results for input(s): CHOL, HDL, LDLCALC, TRIG, CHOLHDL, LDLDIRECT in the last 72 hours. Thyroid Function Tests: No results for input(s): TSH, T4TOTAL, FREET4, T3FREE, THYROIDAB in the last 72 hours. Anemia Panel: No results for input(s): VITAMINB12, FOLATE, FERRITIN, TIBC, IRON, RETICCTPCT in the last 72 hours. Sepsis Labs: No results for input(s): PROCALCITON, LATICACIDVEN in the last 168 hours.  Recent Results (from the past 240 hour(s))  SARS Coronavirus 2 by RT PCR (hospital order, performed in [facility] hospital lab) Nasopharyngeal Nasopharyngeal Swab     Status: None   Collection Time: 04/12/20  9:59 AM   Specimen: Nasopharyngeal Swab  Result Value Ref Range Status   SARS Coronavirus 2 NEGATIVE NEGATIVE Final    Comment: (NOTE) SARS-CoV-2 target nucleic acids are NOT DETECTED. The SARS-CoV-2 RNA is generally detectable in upper and lower respiratory specimens during the acute phase of infection. The lowest concentration of SARS-CoV-2 viral copies this assay can detect is 250 copies / mL. A negative result does not preclude SARS-CoV-2 infection and should not be used as the sole basis for treatment or other patient management decisions.  A negative result may occur with improper specimen collection / handling, submission of specimen other than nasopharyngeal swab, presence of viral mutation(s) within the areas targeted by this assay, and inadequate number of viral copies (<250 copies / mL). A negative result must be combined with clinical observations, patient history, and epidemiological information. Fact Sheet for Patients:    StrictlyIdeas.no Fact Sheet for Healthcare Providers: BankingDealers.co.za This test is not yet approved or cleared  by the [place] FDA and has been authorized for detection and/or diagnosis of SARS-CoV-2 by FDA under an Emergency Use Authorization (EUA).  This EUA will remain in effect (meaning this test can be used) for the duration of the COVID-19 declaration under Section 564(b)(1) of the Act, 21 U.S.C. section 360bbb-3(b)(1), unless the authorization is terminated or revoked sooner. Performed at [facility], [address] [place], [place] [address]          Radiology Studies: No results found.      Scheduled Meds: . amLODipine  10 mg Oral Daily  . citalopram  20 mg Oral Daily  . heparin  5,000 Units Subcutaneous Q8H  . LORazepam  0-4 mg Intravenous Q8H  . mometasone-formoterol  2 puff Inhalation BID  .  pantoprazole  40 mg Oral Daily  . polyethylene glycol  17 g Oral BID  . QUEtiapine  50 mg Oral QHS  . tamsulosin  0.4 mg Oral Daily   Continuous Infusions: . sodium chloride 75 mL/hr at 04/17/20 2155  . magnesium sulfate bolus IVPB       LOS: 6 days    Time spent: 35 mins     Wyvonnia Dusky, MD [facility] Pager [phone]  If 7PM-7AM, please contact night-coverage [email or web address] 04/18/2020, 7:18 AM

## 2020-04-18 NOTE — Progress Notes (Signed)
Physical Therapy Evaluation Patient Details Name: Miguel [name] MRN: [id number] DOB: 01-17-1961 Today's Date: 04/18/2020   History of Present Illness  59 y.o. male with past medical history of hypertension, alcohol abuse, substance abuse, gout, nephrolithiasis, suicide attempt 4/21 who was admitted with acute kidney injury. CIWA protocol this visit.    Clinical Impression  Patient alert and oriented and able to provide detailed history. States that prior to hospitalization he was the primary caregiver for his wife who is disabled with MS. He lives in a single story apartment within a home with a ramped entrance. His wife has a caregiver 7 days a week and his elderly relative from [place] assists at times as well. He was fully independent with ambulation, ADLs, and IADLs prior to hospitalization. He has a RW and BSC available to himself. He has difficulty getting in and out of the tub/shower due to it's specialized high sides. It was installed for his wife, but actually is harder to use and she is unable to use it. Upon evaluation, patient was I with bed mobility and transfers. He ambulated 200 feet but per finger pulse ox became hypoxic at 83% SpO2 during ambulation, complaining of shortness of breath and fatigue. SpO2 came up very quickly upon rest that suggests possible equipment measurement error. Recommend re-checking prior to discharge. Patient demo wide base of support and some anxiety during ambulation with slight drift to the right in the hallway but appeared steady in the room when moving around there. Recommend he would benefit from home health PT upon discharge for improved balance and return to full PLOF. Patient would benefit from skilled physical therapy to address impairments and functional limitations (see PT problem list below) to work towards stated goals and return to PLOF or maximal functional independence.       Follow Up Recommendations Home health PT    Equipment  Recommendations  None recommended by PT    Recommendations for Other Services       Precautions / Restrictions Precautions Precautions: Fall Restrictions Weight Bearing Restrictions: No      Mobility  Bed Mobility Overal bed mobility: Independent             General bed mobility comments: supine <> sit bed flat  Transfers Overall transfer level: Independent               General transfer comment: sit <> stand from/to bed and chair [facility]  Ambulation/Gait Ambulation/Gait assistance: Min guard Gait Distance (Feet): 200 Feet Assistive device: None Gait Pattern/deviations: WFL(Within Functional Limits);Wide base of support Gait velocity: slow   General Gait Details: patient ambulated 200 feet with no AD and CGA for safety. SpO2 dropped to 83% and patient reported being very out of breath. Required cuing to continue walking to make it back to room. SpO2 came up quickly to 96% upon rest. Suspect measurement error during ambulation. Patient had no loss of balance but ambulates slowly with wide base of support.  Stairs            Wheelchair Mobility    Modified Rankin (Stroke Patients Only)       Balance Overall balance assessment: Needs assistance   Sitting balance-Leahy Scale: Normal       Standing balance-Leahy Scale: Good Standing balance comment: appears slightly unsteady in standing and walking  with wide base of support and veers a bit to the R in the hallway but no loss of balance and moves around the room confidently.  Pertinent Vitals/Pain Pain Assessment: 0-10 Pain Score: 9  Pain Location: right side of trunk Pain Intervention(s): Limited activity within patient's tolerance;Monitored during session;Patient requesting pain meds-RN notified    Home Living Family/patient expects to be discharged to:: Private residence Living Arrangements: Spouse/significant other Available Help at Discharge:  Family;Available PRN/intermittently Type of Home: Apartment Home Access: Ramped entrance     Home Layout: One level Home Equipment: Walker - 2 wheels;Bedside commode Additional Comments: Patient lives with his wife who he cares for. She has MS and has home health aide daily as well as intermittant help from 63 year old aunt who lives in [place] and cannot drive at night or in the rain.    Prior Function Level of Independence: Independent         Comments: Patient reports being fully independent with no AD. Thinks he slipped and fell once in the last 6 months. Drives and does own grocery shopping etc. Helps care for his wife with MS. She has home health aide 7 days a week and help from an elderly aunt.     Hand Dominance        Extremity/Trunk Assessment   Upper Extremity Assessment Upper Extremity Assessment: Overall WFL for tasks assessed    Lower Extremity Assessment Lower Extremity Assessment: Overall WFL for tasks assessed    Cervical / Trunk Assessment Cervical / Trunk Assessment: Normal  Communication   Communication: No difficulties  Cognition Arousal/Alertness: Awake/alert Behavior During Therapy: WFL for tasks assessed/performed;Anxious Overall Cognitive Status: Within Functional Limits for tasks assessed                                 General Comments: A bit anxious when ambulating out of the room. Grossly WFL but does seem a bit dazed at times.      General Comments General comments (skin integrity, edema, etc.): patient is slightly anxious and focused on wife's care vs his.    Exercises Other Exercises Other Exercises: educated on role of PT in acute care setting.   Assessment/Plan    PT Assessment Patient needs continued PT services  PT Problem List Decreased activity tolerance;Cardiopulmonary status limiting activity;Pain;Decreased balance;Decreased cognition;Decreased mobility       PT Treatment Interventions DME  instruction;Gait training;Therapeutic activities;Therapeutic exercise;Functional mobility training;Neuromuscular re-education;Balance training;Cognitive remediation;Patient/family education    PT Goals (Current goals can be found in the Care Plan section)  Acute Rehab PT Goals Patient Stated Goal: return home PT Goal Formulation: With patient Time For Goal Achievement: 05/02/20 Potential to Achieve Goals: Good    Frequency Min 2X/week   Barriers to discharge Other (comment) patient may not be able to care for wife at the level he was previously and require additional support    Co-evaluation               AM-PAC PT "6 Clicks" Mobility  Outcome Measure Help needed turning from your back to your side while in a flat bed without using bedrails?: None Help needed moving from lying on your back to sitting on the side of a flat bed without using bedrails?: None Help needed moving to and from a bed to a chair (including a wheelchair)?: None Help needed standing up from a chair using your arms (e.g., wheelchair or bedside chair)?: None Help needed to walk in hospital room?: None Help needed climbing 3-5 steps with a railing? : A Little 6 Click Score: 23  End of Session Equipment Utilized During Treatment: Gait belt Activity Tolerance: Patient tolerated treatment well(limited by SpO2 drop to 83%)   Nurse Communication: Mobility status;Other (comment)(need for pain meds, apparent SpO2 drop with ambulation) PT Visit Diagnosis: Unsteadiness on feet (R26.81);Pain Pain - Right/Left: Right Pain - part of body: (trunk)    Time: [id number] PT Time Calculation (min) (ACUTE ONLY): 20 min   Charges:   PT Evaluation $PT Eval Low Complexity: 1 Low          Kamora Vossler R. Graylon Good, PT, DPT 04/18/20, 4:31 PM

## 2020-04-18 NOTE — Progress Notes (Signed)
[facility]  ROUNDING NOTE   Subjective:  Patient overall doing better. Creatinine down to 3.53. Still a bit nauseous at times.  Objective:  Vital signs in last 24 hours:  Temp:  [97.8 F (36.6 C)-98.2 F (36.8 C)] 98.2 F (36.8 C) (06/03 0857) Pulse Rate:  [68-81] 81 (06/03 0857) Resp:  [17] 17 (06/03 0857) BP: (145-173)/(81-94) 148/81 (06/03 0857) SpO2:  [92 %-96 %] 96 % (06/03 0857)  Weight change:  Filed Weights   04/11/20 2149  Weight: 83.9 kg    Intake/Output: I/O last 3 completed shifts: In: 2472.5 [P.O.:240; I.V.:2232.5] Out: 2600 [Urine:2600]   Intake/Output this shift:  Total I/O In: 360 [P.O.:360] Out: -   Physical Exam: General: No acute distress  Head: Normocephalic, atraumatic. Moist oral mucosal membranes  Eyes: Anicteric  Neck: Supple, trachea midline  Lungs:  Clear to auscultation, normal effort  Heart: S1S2 no rubs  Abdomen:  Soft, nontender, bowel sounds present  Extremities: No peripheral edema.  Neurologic: Awake, alert, following commands  Skin: No lesions       Basic Metabolic Panel: Recent Labs  Lab 04/14/20 0625 04/14/20 0625 04/15/20 0516 04/15/20 0516 04/16/20 0627 04/17/20 0615 04/18/20 0500  NA 143  --  144  --  144 146* 145  K 3.8  --  4.1  --  4.2 3.9 4.0  CL 96*  --  98  --  101 105 106  CO2 33*  --  35*  --  _0 GLUCOSE 100*  --  110*  --  84 92 90  BUN 63*  --  54*  --  45* 39* 31*  CREATININE 10.93*  --  8.64*  --  6.20* 4.58* 3.53*  CALCIUM 8.1*   < > 9.0   < > 8.9 9.1 9.1  MG 1.3*  --  1.9  --  1.6* 1.9 1.5*  PHOS 7.3*  --  6.8*  --  4.8* 3.8 3.4   < > = values in this interval not displayed.    Liver Function Tests: Recent Labs  Lab 04/12/20 0813 04/12/20 1119  AST 9* 8*  ALT 11 11  ALKPHOS 67 61  BILITOT 1.3* 0.9  PROT 7.6 6.4*  ALBUMIN 4.1 3.5   Recent Labs  Lab 04/12/20 0813  LIPASE 80*   No results for input(s): AMMONIA in the last 168 hours.  CBC: Recent Labs   Lab 04/14/20 0625 04/15/20 0516 04/16/20 0627 04/17/20 0615 04/18/20 0500  WBC 4.8 4.8 4.7 4.6 4.6  HGB 10.1* 10.1* 9.7* 9.7* 10.1*  HCT 30.9* 30.8* 30.5* 31.4* 31.6*  MCV 87.8 89.5 90.5 93.2 91.1  PLT 174 178 198 214 231    Cardiac Enzymes: Recent Labs  Lab 04/12/20 1119  CKTOTAL 17*    BNP: Invalid input(s): POCBNP  CBG: No results for input(s): GLUCAP in the last 168 hours.  Microbiology: Results for orders placed or performed during the hospital encounter of 04/12/20  SARS Coronavirus 2 by RT PCR (hospital order, performed in [facility] hospital lab) Nasopharyngeal Nasopharyngeal Swab     Status: None   Collection Time: 04/12/20  9:59 AM   Specimen: Nasopharyngeal Swab  Result Value Ref Range Status   SARS Coronavirus 2 NEGATIVE NEGATIVE Final    Comment: (NOTE) SARS-CoV-2 target nucleic acids are NOT DETECTED. The SARS-CoV-2 RNA is generally detectable in upper and lower respiratory specimens during the acute phase of infection. The lowest concentration of SARS-CoV-2 viral copies this assay can  detect is 250 copies / mL. A negative result does not preclude SARS-CoV-2 infection and should not be used as the sole basis for treatment or other patient management decisions.  A negative result may occur with improper specimen collection / handling, submission of specimen other than nasopharyngeal swab, presence of viral mutation(s) within the areas targeted by this assay, and inadequate number of viral copies (<250 copies / mL). A negative result must be combined with clinical observations, patient history, and epidemiological information. Fact Sheet for Patients:   StrictlyIdeas.no Fact Sheet for Healthcare Providers: BankingDealers.co.za This test is not yet approved or cleared  by the [place] FDA and has been authorized for detection and/or diagnosis of SARS-CoV-2 by FDA under an Emergency Use Authorization  (EUA).  This EUA will remain in effect (meaning this test can be used) for the duration of the COVID-19 declaration under Section 564(b)(1) of the Act, 21 U.S.C. section 360bbb-3(b)(1), unless the authorization is terminated or revoked sooner. Performed at [facility], [address] [place], [place] [address]     Coagulation Studies: No results for input(s): LABPROT, INR in the last 72 hours.  Urinalysis: No results for input(s): COLORURINE, LABSPEC, PHURINE, GLUCOSEU, HGBUR, BILIRUBINUR, KETONESUR, PROTEINUR, UROBILINOGEN, NITRITE, LEUKOCYTESUR in the last 72 hours.  Invalid input(s): APPERANCEUR    Imaging: No results found.   Medications:   . sodium chloride 75 mL/hr at 04/17/20 2155   . amLODipine  10 mg Oral Daily  . citalopram  20 mg Oral Daily  . heparin  5,000 Units Subcutaneous Q8H  . mometasone-formoterol  2 puff Inhalation BID  . pantoprazole  40 mg Oral Daily  . polyethylene glycol  17 g Oral BID  . QUEtiapine  50 mg Oral QHS  . tamsulosin  0.4 mg Oral Daily   labetalol, morphine, ondansetron **OR** ondansetron (ZOFRAN) IV  Assessment/ Plan:  59 y.o. male with past medical history of hypertension, alcohol abuse, substance abuse, gout, nephrolithiasis, suicide attempt 4/21 who was admitted with acute kidney injury.  1.  Acute kidney injury.  Likely secondary to ATN from volume depletion which was secondary to nausea, vomiting, diarrhea, and ibuprofen. -Creatinine continues to trend down and currently 3.53 with an EGFR of 18.  Good urine output noted.  No indication for dialysis.  Continue to monitor renal parameters and avoid nephrotoxins as possible.  2.  Metabolic acidosis.  Resolved at the moment as serum bicarbonate currently 28.  3.  Hyperphosphatemia.  Phosphorus normalized at 3.4.      LOS: 6   6/3/20211:41 PM

## 2020-04-19 LAB — BASIC METABOLIC PANEL
Anion gap: 8 (ref 5–15)
BUN: 23 mg/dL — ABNORMAL HIGH (ref 6–20)
CO2: 29 mmol/L (ref 22–32)
Calcium: 8.6 mg/dL — ABNORMAL LOW (ref 8.9–10.3)
Chloride: 108 mmol/L (ref 98–111)
Creatinine, Ser: 2.88 mg/dL — ABNORMAL HIGH (ref 0.61–1.24)
GFR calc Af Amer: 26 mL/min — ABNORMAL LOW (ref >60)
GFR calc non Af Amer: 23 mL/min — ABNORMAL LOW (ref >60)
Glucose, Bld: 88 mg/dL (ref 70–99)
Potassium: 3.9 mmol/L (ref 3.5–5.1)
Sodium: 145 mmol/L (ref 135–145)

## 2020-04-19 LAB — CBC
HCT: 29.3 % — ABNORMAL LOW (ref 39.0–52.0)
Hemoglobin: 9.1 g/dL — ABNORMAL LOW (ref 13.0–17.0)
MCH: 28.9 pg (ref 26.0–34.0)
MCHC: 31.1 g/dL (ref 30.0–36.0)
MCV: 93 fL (ref 80.0–100.0)
Platelets: 225 10*3/uL (ref 150–400)
RBC: 3.15 MIL/uL — ABNORMAL LOW (ref 4.22–5.81)
RDW: 15.1 % (ref 11.5–15.5)
WBC: 4.7 10*3/uL (ref 4.0–10.5)
nRBC: 0 % (ref 0.0–0.2)

## 2020-04-19 LAB — MAGNESIUM: Magnesium: 1.6 mg/dL — ABNORMAL LOW (ref 1.7–2.4)

## 2020-04-19 LAB — PHOSPHORUS: Phosphorus: 3.2 mg/dL (ref 2.5–4.6)

## 2020-04-19 MED ORDER — TAMSULOSIN HCL 0.4 MG PO CAPS: 0.4000 mg | ORAL_CAPSULE | Freq: Every day | ORAL | 0 refills | Status: AC

## 2020-04-19 MED ORDER — MAGNESIUM SULFATE 2 GM/50ML IV SOLN
2.0000 g | Freq: Once | INTRAVENOUS | Status: AC
  Administered 2020-04-19: 2 g via INTRAVENOUS
  Filled 2020-04-19: qty 50

## 2020-04-19 MED ORDER — OXYCODONE-ACETAMINOPHEN 7.5-325 MG PO TABS: 1.0000 | ORAL_TABLET | Freq: Three times a day (TID) | ORAL | 0 refills | Status: AC | PRN

## 2020-04-19 NOTE — TOC Transition Note (Signed)
Transition of Care [facility]) - CM/SW Discharge Note   Patient Details  Name: Miguel [name] MRN: [id number] Date of Birth: 30-Jul-1961  Transition of Care [facility]) CM/SW Contact:  Shelbie Hutching, RN Phone Number: 04/19/2020, 11:58 AM   Clinical Narrative:    Patient will discharge home with home health PT.  Home Health will be provided by [facility] through charity care.  Cassie with [facility] accepted the referral.  Outpatient PT referral also sent out so patient can start OP PT when ready.  Patient has someone coming to pick him up today.  Prescription from [facility] will be picked up and delivered to his room before discharge.  Patient is aware that his narcotic prescription will need to be filled by another pharmacy.     Final next level of care: Home w Home Health Services Barriers to Discharge: Barriers Resolved   Patient Goals and CMS Choice Patient states their goals for this hospitalization and ongoing recovery are:: Go back home and take care of his wife CMS Medicare.gov Compare Post Acute Care list provided to:: Patient Choice offered to / list presented to : Patient  Discharge Placement                       Discharge Plan and Services   Discharge Planning Services: CM Consult                      HH Arranged: PT [facility] Agency: [facility] Date [place]: 04/19/20 Time [place]: [id number] Representative spoke with at [place]: [place] ([place]) Interventions     Readmission Risk Interventions Readmission Risk Prevention Plan 04/18/2020 05/28/2019 05/08/2019  Transportation Screening Complete Complete Complete  Medication Review Press photographer) Complete Complete Complete  PCP or Specialist appointment within 3-5 days of discharge Complete - Complete  HRI or Home Care Consult Complete Complete (No Data)  [place] or Home Care Consult Pt Refusal Comments - - -  SW Recovery Care/Counseling Consult Complete -  -  SW Consult Not Complete Comments - - -  Palliative Care Screening Not Applicable Not Applicable Not [place] Not Applicable Not Applicable Not Applicable  Some recent data might be hidden

## 2020-04-19 NOTE — TOC Progression Note (Signed)
Transition of Care [facility]) - Progression Note    Patient Details  Name: Miguel [name] MRN: [id number] Date of Birth: [birth year]  Transition of Care [facility]) CM/SW Contact  Shelbie Hutching, RN Phone Number: 04/19/2020, 12:55 PM  Clinical Narrative:     [facility] has accepted home health referral for PT.  Patient's medications have been picked up from [facility] and given directly to the patient in his room.  Patient request a taxi voucher to get home, family member is unable to pick him up.  [employer] called and will pick up patient.    Expected Discharge Plan: Home/Self Care Barriers to Discharge: Barriers Resolved  Expected Discharge Plan and Services Expected Discharge Plan: Home/Self Care   Discharge Planning Services: CM Consult   Living arrangements for the past 2 months: Single Family Home Expected Discharge Date: 04/19/20                         HH Arranged: PT HH Agency: [facility] Date [place]: 04/19/20 Time [place]: [place] Representative spoke with at [place]: Cassie   Social Determinants of Health ([place]) Interventions    Readmission Risk Interventions Readmission Risk Prevention Plan 04/18/2020 05/28/2019 05/08/2019  Transportation Screening Complete Complete Complete  Medication Review Press photographer) Complete Complete Complete  PCP or Specialist appointment within 3-5 days of discharge Complete - Complete  HRI or Home Care Consult Complete Complete (No Data)  [place] or Home Care Consult Pt Refusal Comments - - -  SW Recovery Care/Counseling Consult Complete - -  SW Consult Not Complete Comments - - -  Palliative Care Screening Not Applicable Not Applicable Not [place] Not Applicable Not Applicable Not Applicable  Some recent data might be hidden

## 2020-04-19 NOTE — Progress Notes (Signed)
Pt d/c to home via [employer]. Education completed. IV removed intact. VSS. All belongings sent with pt.

## 2020-04-19 NOTE — Discharge Instructions (Signed)

## 2020-04-19 NOTE — Progress Notes (Signed)
[facility]  ROUNDING NOTE   Subjective:  Urine output good at 2.9 L. Creatinine continues to drift down and currently 2.88. eGFR currently 23.  Objective:  Vital signs in last 24 hours:  Temp:  [98.3 F (36.8 C)-99 F (37.2 C)] 99 F (37.2 C) (06/04 0817) Pulse Rate:  [72-81] 78 (06/04 0817) Resp:  [16-18] 18 (06/04 0817) BP: (139-144)/(79-85) 141/83 (06/04 0817) SpO2:  [83 %-99 %] 91 % (06/04 0817)  Weight change:  Filed Weights   04/11/20 2149  Weight: 83.9 kg    Intake/Output: I/O last 3 completed shifts: In: 360 [P.O.:360] Out: 3850 [Urine:3850]   Intake/Output this shift:  No intake/output data recorded.  Physical Exam: General: No acute distress  Head: Normocephalic, atraumatic. Moist oral mucosal membranes  Eyes: Anicteric  Neck: Supple, trachea midline  Lungs:  Clear to auscultation, normal effort  Heart: S1S2 no rubs  Abdomen:  Soft, nontender, bowel sounds present  Extremities: No peripheral edema.  Neurologic: Awake, alert, following commands  Skin: No lesions       Basic Metabolic Panel: Recent Labs  Lab 04/15/20 0516 04/15/20 0516 04/16/20 0627 04/16/20 0627 04/17/20 0615 04/18/20 0500 04/19/20 0444  NA 144  --  144  --  146* 145 145  K 4.1  --  4.2  --  3.9 4.0 3.9  CL 98  --  101  --  105 106 108  CO2 35*  --  31  --  31 28 29   GLUCOSE 110*  --  84  --  92 90 88  BUN 54*  --  45*  --  39* 31* 23*  CREATININE 8.64*  --  6.20*  --  4.58* 3.53* 2.88*  CALCIUM 9.0   < > 8.9   < > 9.1 9.1 8.6*  MG 1.9  --  1.6*  --  1.9 1.5* 1.6*  PHOS 6.8*  --  4.8*  --  3.8 3.4 3.2   < > = values in this interval not displayed.    Liver Function Tests: Recent Labs  Lab 04/12/20 1119  AST 8*  ALT 11  ALKPHOS 61  BILITOT 0.9  PROT 6.4*  ALBUMIN 3.5   No results for input(s): LIPASE, AMYLASE in the last 168 hours. No results for input(s): AMMONIA in the last 168 hours.  CBC: Recent Labs  Lab 04/15/20 0516 04/16/20 0627  04/17/20 0615 04/18/20 0500 04/19/20 0444  WBC 4.8 4.7 4.6 4.6 4.7  HGB 10.1* 9.7* 9.7* 10.1* 9.1*  HCT 30.8* 30.5* 31.4* 31.6* 29.3*  MCV 89.5 90.5 93.2 91.1 93.0  PLT 178 198 214 231 225    Cardiac Enzymes: Recent Labs  Lab 04/12/20 1119  CKTOTAL 17*    BNP: Invalid input(s): POCBNP  CBG: No results for input(s): GLUCAP in the last 168 hours.  Microbiology: Results for orders placed or performed during the hospital encounter of 04/12/20  SARS Coronavirus 2 by RT PCR (hospital order, performed in [facility] hospital lab) Nasopharyngeal Nasopharyngeal Swab     Status: None   Collection Time: 04/12/20  9:59 AM   Specimen: Nasopharyngeal Swab  Result Value Ref Range Status   SARS Coronavirus 2 NEGATIVE NEGATIVE Final    Comment: (NOTE) SARS-CoV-2 target nucleic acids are NOT DETECTED. The SARS-CoV-2 RNA is generally detectable in upper and lower respiratory specimens during the acute phase of infection. The lowest concentration of SARS-CoV-2 viral copies this assay can detect is 250 copies / mL. A negative result does  not preclude SARS-CoV-2 infection and should not be used as the sole basis for treatment or other patient management decisions.  A negative result may occur with improper specimen collection / handling, submission of specimen other than nasopharyngeal swab, presence of viral mutation(s) within the areas targeted by this assay, and inadequate number of viral copies (<250 copies / mL). A negative result must be combined with clinical observations, patient history, and epidemiological information. Fact Sheet for Patients:   StrictlyIdeas.no Fact Sheet for Healthcare Providers: BankingDealers.co.za This test is not yet approved or cleared  by the [place] FDA and has been authorized for detection and/or diagnosis of SARS-CoV-2 by FDA under an Emergency Use Authorization (EUA).  This EUA will remain in  effect (meaning this test can be used) for the duration of the COVID-19 declaration under Section 564(b)(1) of the Act, 21 U.S.C. section 360bbb-3(b)(1), unless the authorization is terminated or revoked sooner. Performed at [facility], [address] [place], [place] [address]     Coagulation Studies: No results for input(s): LABPROT, INR in the last 72 hours.  Urinalysis: No results for input(s): COLORURINE, LABSPEC, PHURINE, GLUCOSEU, HGBUR, BILIRUBINUR, KETONESUR, PROTEINUR, UROBILINOGEN, NITRITE, LEUKOCYTESUR in the last 72 hours.  Invalid input(s): APPERANCEUR    Imaging: No results found.   Medications:   . sodium chloride 75 mL/hr at 04/19/20 0509   . amLODipine  10 mg Oral Daily  . citalopram  20 mg Oral Daily  . heparin  5,000 Units Subcutaneous Q8H  . mometasone-formoterol  2 puff Inhalation BID  . pantoprazole  40 mg Oral Daily  . polyethylene glycol  17 g Oral BID  . QUEtiapine  50 mg Oral QHS  . tamsulosin  0.4 mg Oral Daily   labetalol, morphine, ondansetron **OR** ondansetron (ZOFRAN) IV  Assessment/ Plan:  59 y.o. male with past medical history of hypertension, alcohol abuse, substance abuse, gout, nephrolithiasis, suicide attempt 4/21 who was admitted with acute kidney injury.  1.  Acute kidney injury.  Likely secondary to ATN from volume depletion which was secondary to nausea, vomiting, diarrhea, and ibuprofen. -Renal function improving.  Creatinine down to 2.8 with an EGFR 23 and urine output 2.9 L over the preceding 24 hours.  No indication for dialysis.  Continue hydration while patient here.  Otherwise disposition as per hospitalist.  2.  Metabolic acidosis.  Resolved with improved renal function as well as prior hydration.  3.  Hyperphosphatemia.  Phosphorus now remains in normal at 3.2.      LOS: 7 Barre Aydelott 6/4/202111:09 AM

## 2020-04-19 NOTE — Discharge Summary (Signed)
Physician Discharge Summary  Miguel [name] OMA:[id number] DOB: 1961-10-02 DOA: 04/12/2020  PCP: Langston Reusing, NP  Admit date: 04/12/2020 Discharge date: 04/19/2020  Admitted From: home Disposition:  Home w/ home health   Recommendations for Outpatient Follow-up:  1. Follow up with PCP in 1-2 weeks 2. F/u nephro, Dr. Holley Raring, in 1-2 weeks  Home Health: yes Equipment/Devices:  Discharge Condition: stable CODE STATUS: full  Diet recommendation: Heart Healthy   Brief/Interim Summary: HPI was taken from Dr. Francine Graven: Miguel [name] is a 59 y.o. male with medical history significant for alcohol abuse, hypertension and gout who presents to the emergency room for evaluation of abdominal pain.  Pain is mostly in the right upper quadrant and started 1 day prior to his presentation to the ER.  He states that the pain is severe and rates it a 10 x 10 in intensity at its worst.  Abdominal pain is associated with nausea but he denies having any vomiting or denies any changes in his bowel habits.  He complains of chills but has not had any fever.  Patient is status post cholecystectomy.  Patient admits to continued alcohol use but is working on quitting. He states that he had some alcohol 3 days prior to presenting to the emergency room and before that it had been a week since he had any alcohol ingestion. Labs revealed a serum creatinine of 18 and a BUN of 81 compared to a creatinine of 1.43 and a BUN of 22.  Labs also reveal an anion gap of 19 and a serum bicarbonate level of 18. He had a CT scan of abdomen and pelvis done without contrast which does not show any evidence of obstructive uropathy.  ED Course: Patient with a history of alcoholism who presents to the emergency room for evaluation of right upper quadrant abdominal pain.  Patient is noted to have been acute renal failure.  He will be admitted to the hospital for further evaluation.  Hospital Course from Dr. Lenise Herald 6/2-04/19/20: Pt was  found to have AKI likely secondary to ATN from volume depletion (from vomiting, diarrhea & NSAID use). Pt was treated conservatively w/ IVFs and was 2.88 on day of d/c. Pt will f/u outpatient w/ nephro for further evaluation. Pt verbalized his understanding. Of note, PT saw pt and recommended home health. Home health charity was set up by CM prior to d/c as pt does not have insurance. Also, pt was found to have hematuria likely secondary kidney stones ( largest one was 62mm). The hematuria resolved prior to d/c. Pt also received flomax while inpatient     Discharge Diagnoses:  Principal Problem:   AKI (acute kidney injury) ([place]) Active Problems:   Alcohol use disorder, severe, dependence ([place])   Abdominal pain   Hypertensive urgency  AKI: likely secondary to ATN from volume depletion which was secondary to nausea, vomiting, diarrhea, and ibuprofen. At baseline Cr 1.43. CT ab/pelvis showed kidney stones but no other acute findings. Continue to hold lisinopril. Continue on IVFs while inpatient. Cr continues to trend down daily. Nephro following and recs apprec  Generalized weakness: PT recs home health   Metabolic acidosis: resolved  Hyperphosphatemia: resolved   Hematuria: secondary to nephrolithiasis. CT showed multiple bilateral nonobstructing renal calculi measuring up to 5 mm. Morphine PRN for pain. Continue on flomax. Resolved  Hypertensive urgency: resolved. Continue amlodipine   History of alcohol dependence: increased risk for developing symptoms of alcohol withdrawal. Continue w/ CIWA   Depression: severity  unknown. Continue celexa and seroquel  Discharge Instructions  Discharge Instructions    Diet - low sodium heart healthy   Complete by: As directed    Discharge instructions   Complete by: As directed    F/U PCP in 1 week; F/u urology as needed   Increase activity slowly   Complete by: As directed      Allergies as of 04/19/2020      Reactions   Percocet  [oxycodone-acetaminophen] Nausea And Vomiting   Took on an empty stomach, can tolerate on a full stomach.      Medication List    TAKE these medications   albuterol 108 (90 Base) MCG/ACT inhaler Commonly known as: VENTOLIN HFA Inhale 2 puffs into the lungs every 6 (six) hours as needed.   allopurinol 300 MG tablet Commonly known as: ZYLOPRIM Take 1 tablet (300 mg total) by mouth daily.   ascorbic acid 500 MG tablet Commonly known as: VITAMIN C Take by mouth.   citalopram 20 MG tablet Commonly known as: CeleXA Take 1 tablet (20 mg total) by mouth daily.   cloNIDine 0.1 MG tablet Commonly known as: CATAPRES Take 0.1 mg by mouth daily.   colchicine 0.6 MG tablet Take by mouth.   Fluticasone-Salmeterol 100-50 MCG/DOSE Aepb Commonly known as: Advair Diskus Inhale 1 puff into the lungs 2 (two) times daily.   folic acid 1 MG tablet Commonly known as: FOLVITE Take 1 mg by mouth daily.   lisinopril 40 MG tablet Commonly known as: ZESTRIL Take 1 tablet (40 mg total) by mouth daily.   oxyCODONE-acetaminophen 7.5-325 MG tablet Commonly known as: Percocet Take 1 tablet by mouth every 8 (eight) hours as needed for up to 3 days for severe pain.   pantoprazole 40 MG tablet Commonly known as: PROTONIX Take 1 tablet (40 mg total) by mouth daily.   tamsulosin 0.4 MG Caps capsule Commonly known as: FLOMAX Take 1 capsule (0.4 mg total) by mouth daily. Start taking on: April 20, 2020   thiamine 100 MG tablet Take 1 tablet (100 mg total) by mouth daily.      Follow-up Information    [facility] Go in 1 week.   Specialty: Primary Care Contact information: [place] [id number] [phone]         Allergies  Allergen Reactions  . Percocet [Oxycodone-Acetaminophen] Nausea And Vomiting    Took on an empty stomach, can tolerate on a full stomach.    Consultations:  Nephro, Dr.  Holley Raring   Procedures/Studies: CT Abdomen Pelvis Wo Contrast  Result Date: 04/12/2020 CLINICAL DATA:  Right chest pain, prior cholecystectomy, history of kidney stone EXAM: CT ABDOMEN AND PELVIS WITHOUT CONTRAST TECHNIQUE: Multidetector CT imaging of the abdomen and pelvis was performed following the standard protocol without IV contrast. COMPARISON:  03/03/2020 FINDINGS: Lower chest: Lung bases are clear. Hepatobiliary: Unenhanced liver is unremarkable. Status post cholecystectomy. No intrahepatic or extrahepatic ductal dilatation. Pancreas: Within normal limits. Spleen: Within normal limits. Adrenals/Urinary Tract: Adrenal glands are within normal limits. 5 nonobstructing right renal calculi, measuring up to 4 mm in the right lower pole (series 2/image 39). Four nonobstructing left renal calculi, measuring up to 5 mm in the left upper pole (series 2/image 27). No ureteral or bladder calculi. No hydronephrosis. Bladder is within normal limits. Stomach/Bowel: Tiny hiatal hernia. No evidence of bowel obstruction. Scattered small duodenal diverticuli. Normal appendix (series 2/image 63). Scattered sigmoid diverticulosis, without evidence of diverticulitis.  Vascular/Lymphatic: No evidence of abdominal aortic aneurysm. Atherosclerotic calcifications of the abdominal aorta and branch vessels. No suspicious abdominopelvic lymphadenopathy. Reproductive: Mild prostatomegaly. Other: No abdominopelvic ascites. Musculoskeletal: Degenerative changes of the visualized thoracolumbar spine. No rib fracture is seen. IMPRESSION: Multiple bilateral nonobstructing renal calculi measuring up to 5 mm. No ureteral or bladder calculi. No hydronephrosis. No evidence of bowel obstruction.  Normal appendix. Additional ancillary findings as above. Electronically Signed   By: Julian Hy M.D.   On: 04/12/2020 09:25   DG Chest 2 View  Result Date: 04/11/2020 CLINICAL DATA:  Right chest pain with deep breathing and movement EXAM:  CHEST - 2 VIEW COMPARISON:  Radiograph 03/03/2020, CT 03/08/2019 FINDINGS: Some streaky opacities in the lung bases likely reflect atelectasis. Ill-defined opacity in the left lung base appears to reflect the overlap of rib shadows. No consolidation, features of edema, pneumothorax, or effusion. Pulmonary vascularity is normally distributed. The cardiomediastinal contours are unremarkable. No acute osseous or soft tissue abnormality. IMPRESSION: Basilar atelectasis. Otherwise, no acute cardiopulmonary or traumatic findings in the chest. Electronically Signed   By: Lovena Le M.D.   On: 04/11/2020 22:22   CT Head Wo Contrast  Result Date: 03/22/2020 CLINICAL DATA:  Acute pain due to trauma. EXAM: CT HEAD WITHOUT CONTRAST CT CERVICAL SPINE WITHOUT CONTRAST TECHNIQUE: Multidetector CT imaging of the head and cervical spine was performed following the standard protocol without intravenous contrast. Multiplanar CT image reconstructions of the cervical spine were also generated. COMPARISON:  None. FINDINGS: CT HEAD FINDINGS Brain: No evidence of acute infarction, hemorrhage, hydrocephalus, extra-axial collection or mass lesion/mass effect. Atrophy and chronic microvascular ischemic changes are again noted. Vascular: No hyperdense vessel or unexpected calcification. Skull: Normal. Negative for fracture or focal lesion. Sinuses/Orbits: No acute finding. Other: None. CT CERVICAL SPINE FINDINGS Alignment: There is straightening of the cervical spine. Skull base and vertebrae: No acute fracture. No primary bone lesion or focal pathologic process. Soft tissues and spinal canal: No prevertebral fluid or swelling. No visible canal hematoma. Disc levels: There is minimal disc height loss in the lower cervical segments. Upper chest: Negative. Other: None IMPRESSION: 1. No acute intracranial abnormality. 2. Atrophy and chronic microvascular ischemic changes of the brain. 3. No evidence of acute traumatic injury to the cervical  spine. Electronically Signed   By: Constance Holster M.D.   On: 03/22/2020 20:05   CT Cervical Spine Wo Contrast  Result Date: 03/22/2020 CLINICAL DATA:  Acute pain due to trauma. EXAM: CT HEAD WITHOUT CONTRAST CT CERVICAL SPINE WITHOUT CONTRAST TECHNIQUE: Multidetector CT imaging of the head and cervical spine was performed following the standard protocol without intravenous contrast. Multiplanar CT image reconstructions of the cervical spine were also generated. COMPARISON:  None. FINDINGS: CT HEAD FINDINGS Brain: No evidence of acute infarction, hemorrhage, hydrocephalus, extra-axial collection or mass lesion/mass effect. Atrophy and chronic microvascular ischemic changes are again noted. Vascular: No hyperdense vessel or unexpected calcification. Skull: Normal. Negative for fracture or focal lesion. Sinuses/Orbits: No acute finding. Other: None. CT CERVICAL SPINE FINDINGS Alignment: There is straightening of the cervical spine. Skull base and vertebrae: No acute fracture. No primary bone lesion or focal pathologic process. Soft tissues and spinal canal: No prevertebral fluid or swelling. No visible canal hematoma. Disc levels: There is minimal disc height loss in the lower cervical segments. Upper chest: Negative. Other: None IMPRESSION: 1. No acute intracranial abnormality. 2. Atrophy and chronic microvascular ischemic changes of the brain. 3. No evidence of acute traumatic injury to the  cervical spine. Electronically Signed   By: Constance Holster M.D.   On: 03/22/2020 20:05   NM PULMONARY VENT AND PERF (V/Q Scan)  Result Date: 04/12/2020 CLINICAL DATA:  Chest pain. EXAM: NUCLEAR MEDICINE VENTILATION - PERFUSION LUNG SCAN TECHNIQUE: Ventilation images were obtained in multiple projections using inhaled aerosol Tc-50m DTPA. Perfusion images were obtained in multiple projections after intravenous injection of Tc-22m MAA. RADIOPHARMACEUTICALS:  32.65 mCi of Tc-62m DTPA aerosol inhalation and 4.35 mCi  Tc63m MAA IV COMPARISON:  Chest radiographs obtained yesterday. FINDINGS: Ventilation: Normal ventilation of both lungs. No focal ventilation defect. Perfusion: Normal perfusion of both lungs. No wedge shaped peripheral perfusion defects to suggest acute pulmonary embolism. IMPRESSION: Normal examination.  No evidence of pulmonary embolism. Electronically Signed   By: Claudie Revering M.D.   On: 04/12/2020 16:22     Subjective: Pt c/o malaise    Discharge Exam: Vitals:   04/18/20 2339 04/19/20 0817  BP: (!) 144/85 (!) 141/83  Pulse: 72 78  Resp: 17 18  Temp: 98.3 F (36.8 C) 99 F (37.2 C)  SpO2: 99% 91%   Vitals:   04/18/20 1605 04/18/20 1610 04/18/20 2339 04/19/20 0817  BP:   (!) 144/85 (!) 141/83  Pulse:   72 78  Resp:   17 18  Temp:   98.3 F (36.8 C) 99 F (37.2 C)  TempSrc:   Oral Oral  SpO2: (!) 83% 96% 99% 91%  Weight:      Height:        General: Pt is alert, awake, not in acute distress Cardiovascular:  S1/S2 +, no rubs, no gallops Respiratory: CTA bilaterally, no wheezing, no rhonchi Abdominal: Soft, NT, ND, bowel sounds + Extremities: no edema, no cyanosis    The results of significant diagnostics from this hospitalization (including imaging, microbiology, ancillary and laboratory) are listed below for reference.     Microbiology: Recent Results (from the past 240 hour(s))  SARS Coronavirus 2 by RT PCR (hospital order, performed in [facility] hospital lab) Nasopharyngeal Nasopharyngeal Swab     Status: None   Collection Time: 04/12/20  9:59 AM   Specimen: Nasopharyngeal Swab  Result Value Ref Range Status   SARS Coronavirus 2 NEGATIVE NEGATIVE Final    Comment: (NOTE) SARS-CoV-2 target nucleic acids are NOT DETECTED. The SARS-CoV-2 RNA is generally detectable in upper and lower respiratory specimens during the acute phase of infection. The lowest concentration of SARS-CoV-2 viral copies this assay can detect is 250 copies / mL. A negative result does  not preclude SARS-CoV-2 infection and should not be used as the sole basis for treatment or other patient management decisions.  A negative result may occur with improper specimen collection / handling, submission of specimen other than nasopharyngeal swab, presence of viral mutation(s) within the areas targeted by this assay, and inadequate number of viral copies (<250 copies / mL). A negative result must be combined with clinical observations, patient history, and epidemiological information. Fact Sheet for Patients:   StrictlyIdeas.no Fact Sheet for Healthcare Providers: BankingDealers.co.za This test is not yet approved or cleared  by the [place] FDA and has been authorized for detection and/or diagnosis of SARS-CoV-2 by FDA under an Emergency Use Authorization (EUA).  This EUA will remain in effect (meaning this test can be used) for the duration of the COVID-19 declaration under Section 564(b)(1) of the Act, 21 U.S.C. section 360bbb-3(b)(1), unless the authorization is terminated or revoked sooner. Performed at [facility], [place],  [place], [place] [address]      Labs: BNP (last 3 results) No results for input(s): BNP in the last 8760 hours. Basic Metabolic Panel: Recent Labs  Lab 04/15/20 0516 04/16/20 0627 04/17/20 0615 04/18/20 0500 04/19/20 0444  NA 144 144 146* 145 145  K 4.1 4.2 3.9 4.0 3.9  CL 98 101 105 106 108  CO2 35* 31 31 28 29   GLUCOSE 110* 84 92 90 88  BUN 54* 45* 39* 31* 23*  CREATININE 8.64* 6.20* 4.58* 3.53* 2.88*  CALCIUM 9.0 8.9 9.1 9.1 8.6*  MG 1.9 1.6* 1.9 1.5* 1.6*  PHOS 6.8* 4.8* 3.8 3.4 3.2   Liver Function Tests: No results for input(s): AST, ALT, ALKPHOS, BILITOT, PROT, ALBUMIN in the last 168 hours. No results for input(s): LIPASE, AMYLASE in the last 168 hours. No results for input(s): AMMONIA in the last 168 hours. CBC: Recent Labs  Lab 04/15/20 0516  04/16/20 0627 04/17/20 0615 04/18/20 0500 04/19/20 0444  WBC 4.8 4.7 4.6 4.6 4.7  HGB 10.1* 9.7* 9.7* 10.1* 9.1*  HCT 30.8* 30.5* 31.4* 31.6* 29.3*  MCV 89.5 90.5 93.2 91.1 93.0  PLT 178 198 214 231 225   Cardiac Enzymes: No results for input(s): CKTOTAL, CKMB, CKMBINDEX, TROPONINI in the last 168 hours. BNP: Invalid input(s): POCBNP CBG: No results for input(s): GLUCAP in the last 168 hours. D-Dimer No results for input(s): DDIMER in the last 72 hours. Hgb A1c No results for input(s): HGBA1C in the last 72 hours. Lipid Profile No results for input(s): CHOL, HDL, LDLCALC, TRIG, CHOLHDL, LDLDIRECT in the last 72 hours. Thyroid function studies No results for input(s): TSH, T4TOTAL, T3FREE, THYROIDAB in the last 72 hours.  Invalid input(s): FREET3 Anemia work up No results for input(s): VITAMINB12, FOLATE, FERRITIN, TIBC, IRON, RETICCTPCT in the last 72 hours. Urinalysis    Component Value Date/Time   COLORURINE YELLOW (A) 04/12/2020 1506   APPEARANCEUR CLEAR (A) 04/12/2020 1506   APPEARANCEUR Hazy 06/26/2014 2238   LABSPEC 1.008 04/12/2020 1506   LABSPEC 1.036 06/26/2014 2238   PHURINE 6.0 04/12/2020 1506   GLUCOSEU NEGATIVE 04/12/2020 1506   GLUCOSEU Negative 06/26/2014 2238   HGBUR NEGATIVE 04/12/2020 1506   [place] 04/12/2020 1506   BILIRUBINUR 1+ 06/26/2014 2238   KETONESUR 5 (A) 04/12/2020 1506   PROTEINUR NEGATIVE 04/12/2020 1506   NITRITE NEGATIVE 04/12/2020 [place] 04/12/2020 1506   LEUKOCYTESUR 1+ 06/26/2014 2238   Sepsis Labs Invalid input(s): PROCALCITONIN,  WBC,  LACTICIDVEN Microbiology Recent Results (from the past 240 hour(s))  SARS Coronavirus 2 by RT PCR (hospital order, performed in [place] hospital lab) Nasopharyngeal Nasopharyngeal Swab     Status: None   Collection Time: 04/12/20  9:59 AM   Specimen: Nasopharyngeal Swab  Result Value Ref Range Status   SARS Coronavirus 2 NEGATIVE NEGATIVE Final     Comment: (NOTE) SARS-CoV-2 target nucleic acids are NOT DETECTED. The SARS-CoV-2 RNA is generally detectable in upper and lower respiratory specimens during the acute phase of infection. The lowest concentration of SARS-CoV-2 viral copies this assay can detect is 250 copies / mL. A negative result does not preclude SARS-CoV-2 infection and should not be used as the sole basis for treatment or other patient management decisions.  A negative result may occur with improper specimen collection / handling, submission of specimen other than nasopharyngeal swab, presence of viral mutation(s) within the areas targeted by this assay, and inadequate number of viral copies (<250 copies / mL). A  negative result must be combined with clinical observations, patient history, and epidemiological information. Fact Sheet for Patients:   StrictlyIdeas.no Fact Sheet for Healthcare Providers: BankingDealers.co.za This test is not yet approved or cleared  by the [place] FDA and has been authorized for detection and/or diagnosis of SARS-CoV-2 by FDA under an Emergency Use Authorization (EUA).  This EUA will remain in effect (meaning this test can be used) for the duration of the COVID-19 declaration under Section 564(b)(1) of the Act, 21 U.S.C. section 360bbb-3(b)(1), unless the authorization is terminated or revoked sooner. Performed at [facility], [address] [place], [place] [address]      Time coordinating discharge: Over 30 minutes  SIGNED:   Wyvonnia Dusky, MD  [facility] 04/19/2020, 1:01 PM Pager   If 7PM-7AM, please contact night-coverage [email or web address]

## 2020-04-21 ENCOUNTER — Other Ambulatory Visit: Payer: Self-pay

## 2020-04-21 ENCOUNTER — Emergency Department: Payer: Medicaid Other

## 2020-04-21 ENCOUNTER — Emergency Department
Admission: EM | Admit: 2020-04-21 | Discharge: 2020-04-21 | Disposition: A | Discharge status: Home / Self Care | Payer: Medicaid Other | Attending: Student | Admitting: Student

## 2020-04-21 DIAGNOSIS — I10 Essential (primary) hypertension: Secondary | ICD-10-CM | POA: Insufficient documentation

## 2020-04-21 DIAGNOSIS — J45909 Unspecified asthma, uncomplicated: Secondary | ICD-10-CM | POA: Insufficient documentation

## 2020-04-21 DIAGNOSIS — R1011 Right upper quadrant pain: Secondary | ICD-10-CM

## 2020-04-21 DIAGNOSIS — K2970 Gastritis, unspecified, without bleeding: Secondary | ICD-10-CM

## 2020-04-21 DIAGNOSIS — Z87891 Personal history of nicotine dependence: Secondary | ICD-10-CM | POA: Insufficient documentation

## 2020-04-21 DIAGNOSIS — Z79899 Other long term (current) drug therapy: Secondary | ICD-10-CM | POA: Insufficient documentation

## 2020-04-21 LAB — CBC WITH DIFFERENTIAL/PLATELET
Abs Immature Granulocytes: 0.03 10*3/uL (ref 0.00–0.07)
Basophils Absolute: 0 10*3/uL (ref 0.0–0.1)
Basophils Relative: 1 %
Eosinophils Absolute: 0.1 10*3/uL (ref 0.0–0.5)
Eosinophils Relative: 2 %
HCT: 34.5 % — ABNORMAL LOW (ref 39.0–52.0)
Hemoglobin: 11.1 g/dL — ABNORMAL LOW (ref 13.0–17.0)
Immature Granulocytes: 1 %
Lymphocytes Relative: 13 %
Lymphs Abs: 0.8 10*3/uL (ref 0.7–4.0)
MCH: 29.1 pg (ref 26.0–34.0)
MCHC: 32.2 g/dL (ref 30.0–36.0)
MCV: 90.6 fL (ref 80.0–100.0)
Monocytes Absolute: 0.6 10*3/uL (ref 0.1–1.0)
Monocytes Relative: 9 %
Neutro Abs: 4.7 10*3/uL (ref 1.7–7.7)
Neutrophils Relative %: 74 %
Platelets: 278 10*3/uL (ref 150–400)
RBC: 3.81 MIL/uL — ABNORMAL LOW (ref 4.22–5.81)
RDW: 14.4 % (ref 11.5–15.5)
WBC: 6.3 10*3/uL (ref 4.0–10.5)
nRBC: 0 % (ref 0.0–0.2)

## 2020-04-21 LAB — URINALYSIS, COMPLETE (UACMP) WITH MICROSCOPIC
Bacteria, UA: NONE SEEN
Bilirubin Urine: NEGATIVE
Glucose, UA: NEGATIVE mg/dL
Hgb urine dipstick: NEGATIVE
Ketones, ur: 80 mg/dL — AB
Leukocytes,Ua: NEGATIVE
Nitrite: NEGATIVE
Protein, ur: NEGATIVE mg/dL
Specific Gravity, Urine: 1.008 (ref 1.005–1.030)
pH: 6 (ref 5.0–8.0)

## 2020-04-21 LAB — TROPONIN I (HIGH SENSITIVITY)
Troponin I (High Sensitivity): 5 ng/L (ref <18)
Troponin I (High Sensitivity): 4 ng/L (ref <18)

## 2020-04-21 LAB — COMPREHENSIVE METABOLIC PANEL
ALT: 17 U/L (ref 0–44)
ALT: 18 U/L (ref 0–44)
AST: 16 U/L (ref 15–41)
AST: 18 U/L (ref 15–41)
Albumin: 3.7 g/dL (ref 3.5–5.0)
Albumin: 3.7 g/dL (ref 3.5–5.0)
Alkaline Phosphatase: 56 U/L (ref 38–126)
Alkaline Phosphatase: 56 U/L (ref 38–126)
Anion gap: 18 — ABNORMAL HIGH (ref 5–15)
Anion gap: 15 (ref 5–15)
BUN: 17 mg/dL (ref 6–20)
BUN: 16 mg/dL (ref 6–20)
CO2: 20 mmol/L — ABNORMAL LOW (ref 22–32)
CO2: 21 mmol/L — ABNORMAL LOW (ref 22–32)
Calcium: 9.1 mg/dL (ref 8.9–10.3)
Calcium: 8.8 mg/dL — ABNORMAL LOW (ref 8.9–10.3)
Chloride: 101 mmol/L (ref 98–111)
Chloride: 102 mmol/L (ref 98–111)
Creatinine, Ser: 2.48 mg/dL — ABNORMAL HIGH (ref 0.61–1.24)
Creatinine, Ser: 2.18 mg/dL — ABNORMAL HIGH (ref 0.61–1.24)
GFR calc Af Amer: 31 mL/min — ABNORMAL LOW (ref >60)
GFR calc Af Amer: 37 mL/min — ABNORMAL LOW (ref >60)
GFR calc non Af Amer: 27 mL/min — ABNORMAL LOW (ref >60)
GFR calc non Af Amer: 32 mL/min — ABNORMAL LOW (ref >60)
Glucose, Bld: 94 mg/dL (ref 70–99)
Glucose, Bld: 80 mg/dL (ref 70–99)
Potassium: 3.6 mmol/L (ref 3.5–5.1)
Potassium: 3.6 mmol/L (ref 3.5–5.1)
Sodium: 139 mmol/L (ref 135–145)
Sodium: 138 mmol/L (ref 135–145)
Total Bilirubin: 1.9 mg/dL — ABNORMAL HIGH (ref 0.3–1.2)
Total Bilirubin: 1.6 mg/dL — ABNORMAL HIGH (ref 0.3–1.2)
Total Protein: 6.9 g/dL (ref 6.5–8.1)
Total Protein: 6.9 g/dL (ref 6.5–8.1)

## 2020-04-21 LAB — PROTIME-INR
INR: 1 (ref 0.8–1.2)
Prothrombin Time: 13 seconds (ref 11.4–15.2)

## 2020-04-21 LAB — LACTIC ACID, PLASMA: Lactic Acid, Venous: 0.7 mmol/L (ref 0.5–1.9)

## 2020-04-21 LAB — LIPASE, BLOOD: Lipase: 29 U/L (ref 11–51)

## 2020-04-21 MED ORDER — IOHEXOL 9 MG/ML PO SOLN
500.0000 mL | ORAL | Status: AC
  Administered 2020-04-21: 500 mL via ORAL

## 2020-04-21 MED ORDER — ONDANSETRON HCL 4 MG/2ML IJ SOLN
4.0000 mg | Freq: Once | INTRAMUSCULAR | Status: AC
  Administered 2020-04-21: 4 mg via INTRAVENOUS
  Filled 2020-04-21: qty 2

## 2020-04-21 MED ORDER — OMEPRAZOLE 10 MG PO CPDR
10.0000 mg | DELAYED_RELEASE_CAPSULE | Freq: Every day | ORAL | 4 refills | Status: DC
Start: 2020-04-21 — End: 2020-10-15

## 2020-04-21 MED ORDER — ONDANSETRON 4 MG PO TBDP
4.0000 mg | ORAL_TABLET | Freq: Three times a day (TID) | ORAL | 0 refills | Status: DC | PRN
Start: 2020-04-21 — End: 2020-08-19

## 2020-04-21 MED ORDER — MORPHINE SULFATE (PF) 4 MG/ML IV SOLN
4.0000 mg | Freq: Once | INTRAVENOUS | Status: AC
  Administered 2020-04-21: 4 mg via INTRAVENOUS
  Filled 2020-04-21: qty 1

## 2020-04-21 MED ORDER — SODIUM CHLORIDE 0.9 % IV BOLUS
1000.0000 mL | Freq: Once | INTRAVENOUS | Status: AC
  Administered 2020-04-21: 1000 mL via INTRAVENOUS

## 2020-04-21 NOTE — ED Provider Notes (Signed)
[facility] Emergency Department Provider Note  ____________________________________________  Time seen: Approximately 12:08 PM  I have reviewed the triage vital signs and the nursing notes.   HISTORY  Chief Complaint Abdominal Pain    HPI Miguel [name] is a 59 y.o. male who presents the emergency department complaining of right upper quadrant pain x8 days.  Patient was recently seen in the emergency department for similar complaint, it was found that patient had renal failure and was admitted for same.   Patient was inpatient until 2 days ago at which time he was discharged.  Patient states that after it was found that he had renal failure antibody was so concerned about his kidneys that they "stopped figuring out while was hurting."  Patient states that he has been hurting since he first arrived on the 28th.  Patient states that he has had some nausea.  He has had few scattered episodes of emesis.  He is also had a few episodes of watery diarrhea.  He denies any hematic emesis.  He denies any hematochezia.  Patient denies any fevers or chills.  He does have a history of cholecystectomy.  Patient is concerned that he may have a kidney stone as he has had similar pain in the past with kidney stones.  On 28 May he had a CT scan which revealed that he did have nephrolithiasis but this was nonobstructing and in the lower pole of the kidney.  Patient denies any hematuria, polyuria, dysuria.  Patient has a history of anxiety, asthma, GERD, hypertension, nephrolithiasis, OCD, gout.  Patient recently was found to have significant bump in his creatinine with renal failure.  Patient was medically managed and did not have dialysis for same.  Patient also has a long history of alcohol abuse.  Patient states that he has cold [place] stopped 3 weeks ago.  Patient went through withdrawals but states that at this time he was feeling better until he developed this right upper quadrant abdominal  pain.  Patient has not had no alcohol in the past 3 weeks.        Past Medical History:  Diagnosis Date  . Alcohol abuse   . Anxiety   . Asthma   . GERD (gastroesophageal reflux disease)   . Gout   . Hypertension   . Kidney stone   . OCD (obsessive compulsive disorder)   . Renal colic     Patient Active Problem List   Diagnosis Date Noted  . Abdominal pain 04/12/2020  . Hypertensive urgency 04/12/2020  . Alcohol withdrawal ([place]) 03/04/2020  . Right-sided chest wall pain 02/28/2020  . Alcohol abuse with alcohol-induced mood disorder ([place]) 12/02/2019  . Moderate episode of recurrent major depressive disorder ([place])   . Health care maintenance 10/26/2019  . Right knee pain 10/26/2019  . Generalized anxiety disorder 10/14/2019  . 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 Social anxiety disorder 07/27/2019  . Left-sided chest wall pain 06/14/2019  . Alcoholic cirrhosis of liver without ascites ([place]) 06/14/2019  . Alcohol abuse 06/14/2019  . AKI (acute kidney injury) ([place]) 05/27/2019  . Alcohol withdrawal syndrome without complication ([place]) 56/31/4970  . Alcoholic intoxication without complication ([place])   . Sepsis ([employer]) 03/08/2019  . Breast lump or mass 10/04/2018  . Sepsis secondary to UTI ([place]) 09/14/2018  . Asthma 07/07/2018  . GERD (gastroesophageal reflux disease) 07/07/2018  . OCD (obsessive compulsive disorder) 07/07/2018  . Self-inflicted laceration of left wrist ([place]) 03/10/2018  .  Leg hematoma 12/25/2017  . Suicide and self-inflicted injury by cutting and piercing instrument ([place]) 03/10/2017  . Severe recurrent major 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 ([place]) 03/09/2017  . Substance induced mood disorder ([place]) 08/15/2016  . Involuntary commitment 08/15/2016  . Alcohol use disorder, severe, dependence ([place]) 02/05/2016  . Hypertension 12/05/2015  . Tachycardia 12/05/2015  . Gout 11/13/2015  . Chronic back pain  05/02/2015    Past Surgical History:  Procedure Laterality Date  . CHOLECYSTECTOMY  2012  . EXTRACORPOREAL SHOCK WAVE LITHOTRIPSY Left 01/12/2019   Procedure: EXTRACORPOREAL SHOCK WAVE LITHOTRIPSY (ESWL);  Surgeon: Billey Co, MD;  Location: [facility] ORS;  Service: Urology;  Laterality: Left;    Prior to Admission medications   Medication Sig Start Date End Date Taking? Authorizing Provider  albuterol (VENTOLIN HFA) 108 (90 Base) MCG/ACT inhaler Inhale 2 puffs into the lungs every 6 (six) hours as needed. 03/21/20   Clapacs, Madie Reno, MD  allopurinol (ZYLOPRIM) 300 MG tablet Take 1 tablet (300 mg total) by mouth daily. 03/21/20   Clapacs, Madie Reno, MD  ascorbic acid (VITAMIN C) 500 MG tablet Take by mouth. 08/01/19   [provider]  citalopram (CELEXA) 20 MG tablet Take 1 tablet (20 mg total) by mouth daily. 03/21/20 03/21/21  Clapacs, Madie Reno, MD  cloNIDine (CATAPRES) 0.1 MG tablet Take 0.1 mg by mouth daily.    [provider]  colchicine 0.6 MG tablet Take by mouth. 08/01/19   [provider]  Fluticasone-Salmeterol (ADVAIR DISKUS) 100-50 MCG/DOSE AEPB Inhale 1 puff into the lungs 2 (two) times daily. 03/21/20   Clapacs, Madie Reno, MD  folic acid (FOLVITE) 1 MG tablet Take 1 mg by mouth daily.  08/01/19   [provider]  lisinopril (ZESTRIL) 40 MG tablet Take 1 tablet (40 mg total) by mouth daily. 03/21/20   Clapacs, Madie Reno, MD  omeprazole (PRILOSEC) 10 MG capsule Take 1 capsule (10 mg total) by mouth daily. 04/21/20   Zyler Hyson, Charline Bills, PA-C  oxyCODONE-acetaminophen (PERCOCET) 7.5-325 MG tablet Take 1 tablet by mouth every 8 (eight) hours as needed for up to 3 days for severe pain. 04/19/20 04/22/20  Wyvonnia Dusky, MD  pantoprazole (PROTONIX) 40 MG tablet Take 1 tablet (40 mg total) by mouth daily. 03/21/20   Clapacs, Madie Reno, MD  tamsulosin (FLOMAX) 0.4 MG CAPS capsule Take 1 capsule (0.4 mg total) by mouth daily. 04/20/20 05/20/20  Wyvonnia Dusky, MD  thiamine 100 MG  tablet Take 1 tablet (100 mg total) by mouth daily. 03/22/20   Clapacs, Madie Reno, MD    Allergies Percocet [oxycodone-acetaminophen]  Family History  Problem Relation Age of Onset  . Alcohol abuse Father   . Breast cancer Mother 86    Social History Social History   Tobacco Use  . Smoking status: Former Smoker    Types: Cigarettes  . Smokeless tobacco: Never Used  . Tobacco comment: quit 30 years ago  Substance Use Topics  . Alcohol use: Not Currently    Alcohol/week: 12.0 standard drinks    Types: 12 Shots of liquor per week    Comment: pint daily  . Drug use: No     Review of Systems  Constitutional: No fever/chills Eyes: No visual changes. No discharge ENT: No upper respiratory complaints. Cardiovascular: no chest pain. Respiratory: no cough. No SOB. Gastrointestinal: Right upper quadrant abdominal pain.  Positive for nausea, vomiting, diarrhea.  No constipation. Genitourinary: Negative for dysuria. No hematuria Musculoskeletal: Negative for musculoskeletal pain. Skin:  Negative for rash, abrasions, lacerations, ecchymosis. Neurological: Negative for headaches, focal weakness or numbness. 10-point ROS otherwise negative.  ____________________________________________   PHYSICAL EXAM:  VITAL SIGNS: ED Triage Vitals  Enc Vitals Group     BP 04/21/20 1200 (!) 156/106     Pulse Rate 04/21/20 1200 78     Resp 04/21/20 1200 18     Temp 04/21/20 1200 98.4 F (36.9 C)     Temp Source 04/21/20 1200 Oral     SpO2 04/21/20 1159 98 %     Weight 04/21/20 1202 185 lb (83.9 kg)     Height 04/21/20 1202 6' (1.829 m)     Head Circumference --      Peak Flow --      Pain Score 04/21/20 1202 9     Pain Loc --      Pain Edu? --      Excl. in [place]? --      Constitutional: Alert and oriented. Well appearing and in no acute distress. Eyes: Conjunctivae are normal. PERRL. EOMI. Head: Atraumatic. ENT:      Ears:       Nose: No congestion/rhinnorhea.      Mouth/Throat:  Mucous membranes are moist.  Neck: No stridor.    Cardiovascular: Normal rate, regular rhythm. Normal S1 and S2.  Good peripheral circulation. Respiratory: Normal respiratory effort without tachypnea or retractions. Lungs CTAB. Good air entry to the bases with no decreased or absent breath sounds. Gastrointestinal: No visible external abdominal wall findings.  Bowel sounds 4 quadrants.  Soft to palpation all quadrants.  Mildly tender to palpation in the right upper quadrant.  No Murphy sign.  No rebound tenderness.. No guarding or rigidity. No palpable masses. No distention. No CVA tenderness. Musculoskeletal: Full range of motion to all extremities. No gross deformities appreciated. Neurologic:  Normal speech and language. No gross focal neurologic deficits are appreciated.  Skin:  Skin is warm, dry and intact. No rash noted. Psychiatric: Mood and affect are normal. Speech and behavior are normal. Patient exhibits appropriate insight and judgement.   ____________________________________________   LABS (all labs ordered are listed, but only abnormal results are displayed)  Labs Reviewed  COMPREHENSIVE METABOLIC PANEL - Abnormal; Notable for the following components:      Result Value   CO2 20 (*)    Creatinine, Ser 2.48 (*)    Total Bilirubin 1.9 (*)    GFR calc non Af Amer 27 (*)    GFR calc Af Amer 31 (*)    Anion gap 18 (*)    All other components within normal limits  CBC WITH DIFFERENTIAL/PLATELET - Abnormal; Notable for the following components:   RBC 3.81 (*)    Hemoglobin 11.1 (*)    HCT 34.5 (*)    All other components within normal limits  URINALYSIS, COMPLETE (UACMP) WITH MICROSCOPIC - Abnormal; Notable for the following components:   Color, Urine STRAW (*)    APPearance CLEAR (*)    Ketones, ur 80 (*)    All other components within normal limits  COMPREHENSIVE METABOLIC PANEL - Abnormal; Notable for the following components:   CO2 21 (*)    Creatinine, Ser 2.18 (*)     Calcium 8.8 (*)    Total Bilirubin 1.6 (*)    GFR calc non Af Amer 32 (*)    GFR calc Af Amer 37 (*)    All other components within normal limits  LACTIC ACID, PLASMA  PROTIME-INR  LIPASE, BLOOD  LACTIC ACID, PLASMA  TROPONIN I (HIGH SENSITIVITY)  TROPONIN I (HIGH SENSITIVITY)   ____________________________________________  EKG   ____________________________________________  RADIOLOGY I personally viewed and evaluated these images as part of my medical decision making, as well as reviewing the written report by the radiologist.  CT ABDOMEN PELVIS WO CONTRAST  Result Date: 04/21/2020 CLINICAL DATA:  Right upper quadrant abdominal pain. Nausea and vomiting over the past 8 days. EXAM: CT ABDOMEN AND PELVIS WITHOUT CONTRAST TECHNIQUE: Multidetector CT imaging of the abdomen and pelvis was performed following the standard protocol without IV contrast. COMPARISON:  04/12/2020 FINDINGS: Lower chest: Dependent bandlike airspace opacities in the posterior basal segments of the lower lobes, right greater left, favoring atelectasis. Mild scarring in the lingula. Descending thoracic aortic atherosclerotic calcification. Hepatobiliary: Surgically absent gallbladder. Unremarkable noncontrast CT appearance of the liver. Pancreas: Unremarkable Spleen: Unremarkable Adrenals/Urinary Tract: Both adrenal glands appear normal. There is 6 nonobstructive right renal calculi, the largest measuring 0.4 cm in diameter, image 37/5. There are 3 nonobstructive left renal calculi, the largest measuring 0.4 cm in diameter. Stomach/Bowel: Orally administered contrast makes its way down to the cecum. There are 2 moderate-sized periampullary duodenal diverticula, without findings of active inflammation. Normal appendix. Sigmoid colon diverticulosis. No abnormal bowel caliber. Normal appearance of jejunal folds. Vascular/Lymphatic: Mild aortoiliac atherosclerotic vascular calcification. No pathologic adenopathy.  Reproductive: Dystrophic calcifications centrally in the prostate gland. Other: No supplemental non-categorized findings. Musculoskeletal: Healing of a left upper sixth rib fracture observed on the top most image, probably about 2 months old. IMPRESSION: 1. A cause for the patient's right upper quadrant pain and nausea and vomiting is not identified. 2. Subacute healing left sixth rib fracture anteriorly, probably about 2 months old. 3. Bilateral nonobstructive renal calculi. 4. Subsegmental atelectasis in the posterior basal segments of both lower lobes. 5.  Aortic Atherosclerosis (ICD10-I70.0). Electronically Signed   By: Van Clines M.D.   On: 04/21/2020 15:42    ____________________________________________    PROCEDURES  Procedure(s) performed:    Procedures    Medications  iohexol (OMNIPAQUE) 9 MG/ML oral solution 500 mL (500 mLs Oral Contrast Given 04/21/20 1255)  sodium chloride 0.9 % bolus 1,000 mL (0 mLs Intravenous Stopped 04/21/20 1408)  morphine 4 MG/ML injection 4 mg (4 mg Intravenous Given 04/21/20 1411)  ondansetron (ZOFRAN) injection 4 mg (4 mg Intravenous Given 04/21/20 1411)     ____________________________________________   INITIAL IMPRESSION / ASSESSMENT AND PLAN / ED COURSE  Pertinent labs & imaging results that were available during my care of the patient were reviewed by me and considered in my medical decision making (see chart for details).  Review of the [place] CSRS was performed in accordance of the [place] prior to dispensing any controlled drugs.           Patient's diagnosis is consistent with gastritis, right upper quadrant pain.  Patient presented to emergency department complaining of nausea, vomiting, right upper quadrant pain.  This is been ongoing for greater than a week.  Patient states that he was recently admitted to the hospital for renal failure.  Kidney function had improved and patient was discharged home.  He states that he continues to have  the right upper quadrant pain.  He has a history of kidney stones, states that when he was here previously the imaged him and he had nonobstructing nephrolithiasis with no evidence of hydronephrosis or infection.  Patient states that he has had ongoing pain along with the associated nausea and vomiting.  Patient  was mildly tender to palpation in the right upper quadrant.  Labs were overall relatively reassuring.  Patient did have a elevated anion gap.  I felt that this was likely due to poor dietary intake, and had the patient hydrated, with reevaluation of his labs.  Anion gap dropped to 15.  At this time patient is stable for discharge.  I will place the patient on a PPI, antiemetics and advised him to follow-up with primary care..  Patient is given ED precautions to return to the ED for any worsening or new symptoms.     ____________________________________________  FINAL CLINICAL IMPRESSION(S) / ED DIAGNOSES  Final diagnoses:  Gastritis without bleeding, unspecified chronicity, unspecified gastritis type  RUQ pain      NEW MEDICATIONS STARTED DURING THIS VISIT:  ED Discharge Orders         Ordered    omeprazole (PRILOSEC) 10 MG capsule  Daily     04/21/20 1802              This chart was dictated using voice recognition software/Dragon. Despite best efforts to proofread, errors can occur which can change the meaning. Any change was purely unintentional.    Darletta Moll, PA-C 04/21/20 Jarold Song, MD 04/22/20 2342

## 2020-04-21 NOTE — ED Provider Notes (Signed)
ED ECG REPORT I, Arta Silence, the attending physician, personally viewed and interpreted this ECG.  Date: 04/21/2020 EKG Time: 1202 Rate: 77 Rhythm: normal sinus rhythm QRS Axis: normal Intervals: normal ST/T Wave abnormalities: normal Narrative Interpretation: no evidence of acute ischemia    Arta Silence, MD 04/21/20 1217

## 2020-04-21 NOTE — ED Notes (Signed)
Peripheral IV discontinued. Catheter intact. No signs of infiltration or redness. Gauze applied to IV site.   Discharge instructions reviewed with patient. Questions fielded by this RN. Patient verbalizes understanding of instructions. Patient discharged home in stable condition per monks . No acute distress noted at time of discharge.    Pt left room unseen by this RN

## 2020-04-21 NOTE — ED Triage Notes (Signed)
Pt BIBA with c/o ruq pain x8 days.  Recently DC from [facility] for same complaint.  C/o N/V.  GCS 15, rr even/unlabored, abd soft/nontender, old bruising from sub q injections noted.  Skin warm/pink/dry.  vss team at bedside for assessment and pt placed on bedside monitor.

## 2020-04-21 NOTE — ED Notes (Signed)
Pt reports unable to get ride home or money, requests taxi voucher

## 2020-04-23 ENCOUNTER — Other Ambulatory Visit: Payer: Self-pay

## 2020-04-23 ENCOUNTER — Ambulatory Visit: Payer: Medicaid Other | Admitting: Gerontology

## 2020-04-23 VITALS — BP 133/84 | HR 67 | Ht 72.0 in | Wt 186.4 lb

## 2020-04-23 DIAGNOSIS — N179 Acute kidney failure, unspecified: Secondary | ICD-10-CM

## 2020-04-23 DIAGNOSIS — M109 Gout, unspecified: Secondary | ICD-10-CM

## 2020-04-23 DIAGNOSIS — Z0000 Encounter for general adult medical examination without abnormal findings: Secondary | ICD-10-CM

## 2020-04-23 DIAGNOSIS — I10 Essential (primary) hypertension: Secondary | ICD-10-CM

## 2020-04-23 DIAGNOSIS — F1010 Alcohol abuse, uncomplicated: Secondary | ICD-10-CM

## 2020-04-23 MED ORDER — LISINOPRIL 40 MG PO TABS: 40.0000 mg | ORAL_TABLET | Freq: Every day | ORAL | 2 refills | Status: DC

## 2020-04-23 MED ORDER — ALLOPURINOL 300 MG PO TABS: 300.0000 mg | ORAL_TABLET | Freq: Every day | ORAL | 2 refills | Status: DC

## 2020-04-23 NOTE — Progress Notes (Signed)
Established Patient Office Visit  Subjective:  Patient ID: Miguel [name], male    DOB: September 18, 1961  Age: 59 y.o. MRN: [id number]  CC:  Chief Complaint  Patient presents with  . Hypertension    HPI Miguel [name] presents for follow up of hypertension , alcohol, post hospital discharge and medication refill. He states that he's compliant with his medications and continues to make healthy lifestyle modifications. He reports checking his blood pressure 2-3 times weekly and it usually less than 140/90.  He was discharged from the ED on 04/19/2020 for right upper quadrant abdominal pain, was treated for AKI and followed up with Nephrologist Dr Holley Raring on 04/19/2020. His Serum creatinine done on 04/21/2020 was 2.18 mg/dl and eGFR was 32 ml/min, he states that he has increased his water intake. He denies abdominal pain and reports that he has not had any alcoholic beverage in the past 3 weeks and declines following up with [facility] for counseling. He denies any withdrawal symptoms. Overall, he reports that he is doing well, and offers no further complaint.  Past Medical History:  Diagnosis Date  . Alcohol abuse   . Anxiety   . Asthma   . GERD (gastroesophageal reflux disease)   . Gout   . Hypertension   . Kidney stone   . OCD (obsessive compulsive disorder)   . Renal colic     Past Surgical History:  Procedure Laterality Date  . CHOLECYSTECTOMY  2012  . EXTRACORPOREAL SHOCK WAVE LITHOTRIPSY Left 01/12/2019   Procedure: EXTRACORPOREAL SHOCK WAVE LITHOTRIPSY (ESWL);  Surgeon: Billey Co, MD;  Location: [facility] ORS;  Service: Urology;  Laterality: Left;    Family History  Problem Relation Age of Onset  . Alcohol abuse Father   . Breast cancer Mother 1    Social History   Socioeconomic History  . Marital status: Married    Spouse name: Not on file  . Number of children: Not on file  . Years of education: Not on file  . Highest education level: Not on file  Occupational History  . Not on  file  Tobacco Use  . Smoking status: Former Smoker    Types: Cigarettes  . Smokeless tobacco: Never Used  . Tobacco comment: quit 30 years ago  Substance and Sexual Activity  . Alcohol use: Not Currently    Alcohol/week: 12.0 standard drinks    Types: 12 Shots of liquor per week    Comment: pint daily  . Drug use: No  . Sexual activity: Not on file  Other Topics Concern  . Not on file  Social History Narrative   Lives at home with his wife and takes care of his wife. Independent at baseline      - Biggest strain is financial but doesn't think they could got get more help.      Patient expressed interest in possible financial assistance. Informed written consent obtained   Social Determinants of Health   Financial Resource Strain:   . Difficulty of Paying Living Expenses:   Food Insecurity:   . Worried About Charity fundraiser in the Last Year:   . Arboriculturist in the Last Year:   Transportation Needs:   . Film/video editor (Medical):   Marland Kitchen Lack of Transportation (Non-Medical):   Physical Activity:   . Days of Exercise per Week:   . Minutes of Exercise per Session:   Stress:   . Feeling of Stress :   Social Connections:   .  Frequency of Communication with Friends and Family:   . Frequency of Social Gatherings with Friends and Family:   . Attends Religious Services:   . Active Member of Clubs or Organizations:   . Attends Archivist Meetings:   Marland Kitchen Marital Status:   Intimate Partner Violence:   . Fear of Current or Ex-Partner:   . Emotionally Abused:   Marland Kitchen Physically Abused:   . Sexually Abused:     Outpatient Medications Prior to Visit  Medication Sig Dispense Refill  . albuterol (VENTOLIN HFA) 108 (90 Base) MCG/ACT inhaler Inhale 2 puffs into the lungs every 6 (six) hours as needed. 6.7 g 1  . ascorbic acid (VITAMIN C) 500 MG tablet Take by mouth.    . citalopram (CELEXA) 20 MG tablet Take 1 tablet (20 mg total) by mouth daily. 30 tablet 1  .  cloNIDine (CATAPRES) 0.1 MG tablet Take 0.1 mg by mouth daily.    . colchicine 0.6 MG tablet Take by mouth.    . Fluticasone-Salmeterol (ADVAIR DISKUS) 100-50 MCG/DOSE AEPB Inhale 1 puff into the lungs 2 (two) times daily. 284 each 0  . folic acid (FOLVITE) 1 MG tablet Take 1 mg by mouth daily.     Marland Kitchen omeprazole (PRILOSEC) 10 MG capsule Take 1 capsule (10 mg total) by mouth daily. 30 capsule 4  . ondansetron (ZOFRAN-ODT) 4 MG disintegrating tablet Take 1 tablet (4 mg total) by mouth every 8 (eight) hours as needed for nausea or vomiting. 20 tablet 0  . pantoprazole (PROTONIX) 40 MG tablet Take 1 tablet (40 mg total) by mouth daily. 30 tablet 1  . thiamine 100 MG tablet Take 1 tablet (100 mg total) by mouth daily. 30 tablet 1  . allopurinol (ZYLOPRIM) 300 MG tablet Take 1 tablet (300 mg total) by mouth daily. 30 tablet 1  . lisinopril (ZESTRIL) 40 MG tablet Take 1 tablet (40 mg total) by mouth daily. 30 tablet 1  . tamsulosin (FLOMAX) 0.4 MG CAPS capsule Take 1 capsule (0.4 mg total) by mouth daily. (Patient not taking: Reported on 04/23/2020) 30 capsule 0   No facility-administered medications prior to visit.    Allergies  Allergen Reactions  . Percocet [Oxycodone-Acetaminophen] Nausea And Vomiting    Took on an empty stomach, can tolerate on a full stomach.    ROS Review of Systems  Constitutional: Negative.   Eyes: Negative.   Respiratory: Negative.   Cardiovascular: Negative.   Gastrointestinal: Negative.   Neurological: Negative.   Psychiatric/Behavioral: Negative.       Objective:    Physical Exam  Constitutional: He is oriented to person, place, and time. He appears well-developed.  HENT:  Head: Normocephalic and atraumatic.  Eyes: Pupils are equal, round, and reactive to light. EOM are normal.  Cardiovascular: Normal rate and regular rhythm.  Pulmonary/Chest: Effort normal and breath sounds normal.  Abdominal: Soft. Bowel sounds are normal.  Neurological: He is alert  and oriented to person, place, and time.  Psychiatric: He has a normal mood and affect. His behavior is normal. Judgment and thought content normal.    BP 133/84 (BP Location: Left Arm, Patient Position: Sitting)   Pulse 67   Ht 6' (1.829 m)   Wt 186 lb 6.4 oz (84.6 kg)   SpO2 97%   BMI 25.28 kg/m  Wt Readings from Last 3 Encounters:  04/23/20 186 lb 6.4 oz (84.6 kg)  04/21/20 185 lb (83.9 kg)  04/11/20 185 lb (83.9 kg)     Health  Maintenance Due  Topic Date Due  . Hepatitis C Screening  Never done  . COVID-19 Vaccine (1) Never done  . COLONOSCOPY  Never done    There are no preventive care reminders to display for this patient.  Lab Results  Component Value Date   TSH 1.831 03/18/2020   Lab Results  Component Value Date   WBC 6.3 04/21/2020   HGB 11.1 (L) 04/21/2020   HCT 34.5 (L) 04/21/2020   MCV 90.6 04/21/2020   PLT 278 04/21/2020   Lab Results  Component Value Date   NA 138 04/21/2020   K 3.6 04/21/2020   CO2 21 (L) 04/21/2020   GLUCOSE 80 04/21/2020   BUN 16 04/21/2020   CREATININE 2.18 (H) 04/21/2020   BILITOT 1.6 (H) 04/21/2020   ALKPHOS 56 04/21/2020   AST 18 04/21/2020   ALT 18 04/21/2020   PROT 6.9 04/21/2020   ALBUMIN 3.7 04/21/2020   CALCIUM 8.8 (L) 04/21/2020   ANIONGAP 15 04/21/2020   Lab Results  Component Value Date   CHOL 150 03/18/2020   Lab Results  Component Value Date   HDL 64 03/18/2020   Lab Results  Component Value Date   LDLCALC 59 03/18/2020   Lab Results  Component Value Date   TRIG 137 03/18/2020   Lab Results  Component Value Date   CHOLHDL 2.3 03/18/2020   Lab Results  Component Value Date   HGBA1C 5.3 03/18/2020      Assessment & Plan:   1. Essential hypertension - His blood pressure is under control and he will continue on current medication regimen, DASH diet - lisinopril (ZESTRIL) 40 MG tablet; Take 1 tablet (40 mg total) by mouth daily.  Dispense: 30 tablet; Refill: 2  2. Gout, unspecified  cause, unspecified chronicity, unspecified site - His gout is under control and he will continue on current treatment regimen. - allopurinol (ZYLOPRIM) 300 MG tablet; Take 1 tablet (300 mg total) by mouth daily.  Dispense: 30 tablet; Refill: 2  3. Alcohol abuse - He reports that he's sober for 3 weeks, he was encouraged to continue alcohol abstinence and to call Crisis help line for help with any cravings.  4. Health care maintenance - He has not had Colonoscopy and - Ambulatory referral to Gastroenterology Colonoscopy screening  5. AKI (acute kidney injury) ([place]) - His Serum creatinine was 2.18 mg/dl and eGFR 32 ml/min, he was encouraged to increase water intake and call and schedule a follow up appointment with Nephrologist Dr Driscilla Moats. Will recheck BMP in one month.     Follow-up: Return in about 1 month (around 05/23/2020), or if symptoms worsen or fail to improve.    Murlin Schrieber Jerold Coombe, NP

## 2020-04-23 NOTE — Patient Instructions (Signed)
DASH Eating Plan DASH stands for "Dietary Approaches to Stop Hypertension." The DASH eating plan is a healthy eating plan that has been shown to reduce high blood pressure (hypertension). It may also reduce your risk for type 2 diabetes, heart disease, and stroke. The DASH eating plan may also help with weight loss. What are tips for following this plan?  General guidelines  Avoid eating more than 2,300 mg (milligrams) of salt (sodium) a day. If you have hypertension, you may need to reduce your sodium intake to 1,500 mg a day.  Limit alcohol intake to no more than 1 drink a day for nonpregnant women and 2 drinks a day for men. One drink equals 12 oz of beer, 5 oz of wine, or 1 oz of hard liquor.  Work with your health care provider to maintain a healthy body weight or to lose weight. Ask what an ideal weight is for you.  Get at least 30 minutes of exercise that causes your heart to beat faster (aerobic exercise) most days of the week. Activities may include walking, swimming, or biking.  Work with your health care provider or diet and nutrition specialist (dietitian) to adjust your eating plan to your individual calorie needs. Reading food labels   Check food labels for the amount of sodium per serving. Choose foods with less than 5 percent of the Daily Value of sodium. Generally, foods with less than 300 mg of sodium per serving fit into this eating plan.  To find whole grains, look for the word "whole" as the first word in the ingredient list. Shopping  Buy products labeled as "low-sodium" or "no salt added."  Buy fresh foods. Avoid canned foods and premade or frozen meals. Cooking  Avoid adding salt when cooking. Use salt-free seasonings or herbs instead of table salt or sea salt. Check with your health care provider or pharmacist before using salt substitutes.  Do not fry foods. Cook foods using healthy methods such as baking, boiling, grilling, and broiling instead.  Cook with  heart-healthy oils, such as olive, canola, soybean, or sunflower oil. Meal planning  Eat a balanced diet that includes: ? 5 or more servings of fruits and vegetables each day. At each meal, try to fill half of your plate with fruits and vegetables. ? Up to 6-8 servings of whole grains each day. ? Less than 6 oz of lean meat, poultry, or fish each day. A 3-oz serving of meat is about the same size as a deck of cards. One egg equals 1 oz. ? 2 servings of low-fat dairy each day. ? A serving of nuts, seeds, or beans 5 times each week. ? Heart-healthy fats. Healthy fats called Omega-3 fatty acids are found in foods such as flaxseeds and coldwater fish, like sardines, salmon, and mackerel.  Limit how much you eat of the following: ? Canned or prepackaged foods. ? Food that is high in trans fat, such as fried foods. ? Food that is high in saturated fat, such as fatty meat. ? Sweets, desserts, sugary drinks, and other foods with added sugar. ? Full-fat dairy products.  Do not salt foods before eating.  Try to eat at least 2 vegetarian meals each week.  Eat more home-cooked food and less restaurant, buffet, and fast food.  When eating at a restaurant, ask that your food be prepared with less salt or no salt, if possible. What foods are recommended? The items listed may not be a complete list. Talk with your dietitian about   what dietary choices are best for you. Grains Whole-grain or whole-wheat bread. Whole-grain or whole-wheat pasta. Brown rice. Oatmeal. Quinoa. Bulgur. Whole-grain and low-sodium cereals. Pita bread. Low-fat, low-sodium crackers. Whole-wheat flour tortillas. Vegetables Fresh or frozen vegetables (raw, steamed, roasted, or grilled). Low-sodium or reduced-sodium tomato and vegetable juice. Low-sodium or reduced-sodium tomato sauce and tomato paste. Low-sodium or reduced-sodium canned vegetables. Fruits All fresh, dried, or frozen fruit. Canned fruit in natural juice (without  added sugar). Meat and other protein foods Skinless chicken or turkey. Ground chicken or turkey. Pork with fat trimmed off. Fish and seafood. Egg whites. Dried beans, peas, or lentils. Unsalted nuts, nut butters, and seeds. Unsalted canned beans. Lean cuts of beef with fat trimmed off. Low-sodium, lean deli meat. Dairy Low-fat (1%) or fat-free (skim) milk. Fat-free, low-fat, or reduced-fat cheeses. Nonfat, low-sodium ricotta or cottage cheese. Low-fat or nonfat yogurt. Low-fat, low-sodium cheese. Fats and oils Soft margarine without trans fats. Vegetable oil. Low-fat, reduced-fat, or light mayonnaise and salad dressings (reduced-sodium). Canola, safflower, olive, soybean, and sunflower oils. Avocado. Seasoning and other foods Herbs. Spices. Seasoning mixes without salt. Unsalted popcorn and pretzels. Fat-free sweets. What foods are not recommended? The items listed may not be a complete list. Talk with your dietitian about what dietary choices are best for you. Grains Baked goods made with fat, such as croissants, muffins, or some breads. Dry pasta or rice meal packs. Vegetables Creamed or fried vegetables. Vegetables in a cheese sauce. Regular canned vegetables (not low-sodium or reduced-sodium). Regular canned tomato sauce and paste (not low-sodium or reduced-sodium). Regular tomato and vegetable juice (not low-sodium or reduced-sodium). Pickles. Olives. Fruits Canned fruit in a light or heavy syrup. Fried fruit. Fruit in cream or butter sauce. Meat and other protein foods Fatty cuts of meat. Ribs. Fried meat. Bacon. Sausage. Bologna and other processed lunch meats. Salami. Fatback. Hotdogs. Bratwurst. Salted nuts and seeds. Canned beans with added salt. Canned or smoked fish. Whole eggs or egg yolks. Chicken or turkey with skin. Dairy Whole or 2% milk, cream, and half-and-half. Whole or full-fat cream cheese. Whole-fat or sweetened yogurt. Full-fat cheese. Nondairy creamers. Whipped toppings.  Processed cheese and cheese spreads. Fats and oils Butter. Stick margarine. Lard. Shortening. Ghee. Bacon fat. Tropical oils, such as coconut, palm kernel, or palm oil. Seasoning and other foods Salted popcorn and pretzels. Onion salt, garlic salt, seasoned salt, table salt, and sea salt. Worcestershire sauce. Tartar sauce. Barbecue sauce. Teriyaki sauce. Soy sauce, including reduced-sodium. Steak sauce. Canned and packaged gravies. Fish sauce. Oyster sauce. Cocktail sauce. Horseradish that you find on the shelf. Ketchup. Mustard. Meat flavorings and tenderizers. Bouillon cubes. Hot sauce and Tabasco sauce. Premade or packaged marinades. Premade or packaged taco seasonings. Relishes. Regular salad dressings. Where to find more information:  National Heart, Lung, and Blood Institute: www.nhlbi.nih.gov  American Heart Association: www.heart.org Summary  The DASH eating plan is a healthy eating plan that has been shown to reduce high blood pressure (hypertension). It may also reduce your risk for type 2 diabetes, heart disease, and stroke.  With the DASH eating plan, you should limit salt (sodium) intake to 2,300 mg a day. If you have hypertension, you may need to reduce your sodium intake to 1,500 mg a day.  When on the DASH eating plan, aim to eat more fresh fruits and vegetables, whole grains, lean proteins, low-fat dairy, and heart-healthy fats.  Work with your health care provider or diet and nutrition specialist (dietitian) to adjust your eating plan to your   individual calorie needs. This information is not intended to replace advice given to you by your health care provider. Make sure you discuss any questions you have with your health care provider. Document Revised: 10/15/2017 Document Reviewed: 10/26/2016 Elsevier Patient Education  2020 Elsevier Inc.  

## 2020-04-24 ENCOUNTER — Telehealth: Payer: Self-pay | Admitting: Gerontology

## 2020-04-24 NOTE — Telephone Encounter (Signed)
Called to get pt scheduled for a few appts. Spoke with pt

## 2020-05-03 ENCOUNTER — Telehealth (INDEPENDENT_AMBULATORY_CARE_PROVIDER_SITE_OTHER): Payer: Self-pay | Admitting: Gastroenterology

## 2020-05-03 ENCOUNTER — Other Ambulatory Visit: Payer: Self-pay

## 2020-05-03 DIAGNOSIS — Z86010 Personal history of colonic polyps: Secondary | ICD-10-CM

## 2020-05-03 NOTE — Progress Notes (Signed)
Gastroenterology Pre-Procedure Review  Request Date: Tuesday 05/28/20 Requesting Physician: Dr. Allen Norris  PATIENT REVIEW QUESTIONS: The patient responded to the following health history questions as indicated:    1. Are you having any GI issues? no 2. Do you have a personal history of Polyps? yes (2015 2 large 10 mm polyps were noted on colonoscopy performed by dr. Allen Norris) 3. Do you have a family history of Colon Cancer or Polyps? no 4. Diabetes Mellitus? no 5. Joint replacements in the past 12 months?no 6. Major health problems in the past 3 months?yes (04/21/20 ER Gastritis) 7. Any artificial heart valves, MVP, or defibrillator?no    MEDICATIONS & ALLERGIES:    Patient reports the following regarding taking any anticoagulation/antiplatelet therapy:   Plavix, Coumadin, Eliquis, Xarelto, Lovenox, Pradaxa, Brilinta, or Effient? no Aspirin? no  Patient confirms/reports the following medications:  Current Outpatient Medications  Medication Sig Dispense Refill  . albuterol (VENTOLIN HFA) 108 (90 Base) MCG/ACT inhaler Inhale 2 puffs into the lungs every 6 (six) hours as needed. 6.7 g 1  . allopurinol (ZYLOPRIM) 300 MG tablet Take 1 tablet (300 mg total) by mouth daily. 30 tablet 2  . ascorbic acid (VITAMIN C) 500 MG tablet Take by mouth.    . citalopram (CELEXA) 20 MG tablet Take 1 tablet (20 mg total) by mouth daily. 30 tablet 1  . cloNIDine (CATAPRES) 0.1 MG tablet Take 0.1 mg by mouth daily.    . colchicine 0.6 MG tablet Take by mouth.    . Fluticasone-Salmeterol (ADVAIR DISKUS) 100-50 MCG/DOSE AEPB Inhale 1 puff into the lungs 2 (two) times daily. 539 each 0  . folic acid (FOLVITE) 1 MG tablet Take 1 mg by mouth daily.     Marland Kitchen lisinopril (ZESTRIL) 40 MG tablet Take 1 tablet (40 mg total) by mouth daily. 30 tablet 2  . omeprazole (PRILOSEC) 10 MG capsule Take 1 capsule (10 mg total) by mouth daily. 30 capsule 4  . ondansetron (ZOFRAN-ODT) 4 MG disintegrating tablet Take 1 tablet (4 mg total)  by mouth every 8 (eight) hours as needed for nausea or vomiting. 20 tablet 0  . pantoprazole (PROTONIX) 40 MG tablet Take 1 tablet (40 mg total) by mouth daily. 30 tablet 1  . thiamine 100 MG tablet Take 1 tablet (100 mg total) by mouth daily. 30 tablet 1  . tamsulosin (FLOMAX) 0.4 MG CAPS capsule Take 1 capsule (0.4 mg total) by mouth daily. (Patient not taking: Reported on 04/23/2020) 30 capsule 0   No current facility-administered medications for this visit.    Patient confirms/reports the following allergies:  Allergies  Allergen Reactions  . Percocet [Oxycodone-Acetaminophen] Nausea And Vomiting    Took on an empty stomach, can tolerate on a full stomach.    No orders of the defined types were placed in this encounter.   AUTHORIZATION INFORMATION Primary Insurance: 1D#: Group #:  Secondary Insurance: 1D#: Group #:  SCHEDULE INFORMATION: Date: Tuesday 05/28/20 Time: Location:[facility]

## 2020-05-09 ENCOUNTER — Encounter: Payer: Self-pay | Admitting: Gastroenterology

## 2020-05-09 IMAGING — CT CT HEAD W/O CM
3 series · 16 of 47 positions shown, 19 images · non-contrast
Comparison: 03/20/2019

CLINICAL DATA: Patient fell and struck his forehead today.

EXAM:
CT HEAD WITHOUT CONTRAST
TECHNIQUE: Contiguous axial images were obtained from the base of the skull
through the vertex without intravenous contrast.

[Series 2: head wo · axial · 0.42mm/px · z∈[+923,+1053]mm · 10 of 32 slices shown, 13 images]
[im 3/32  brain]
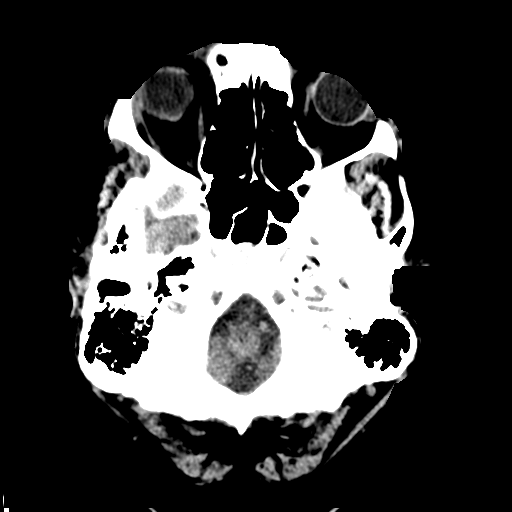
[im 3/32  bone]
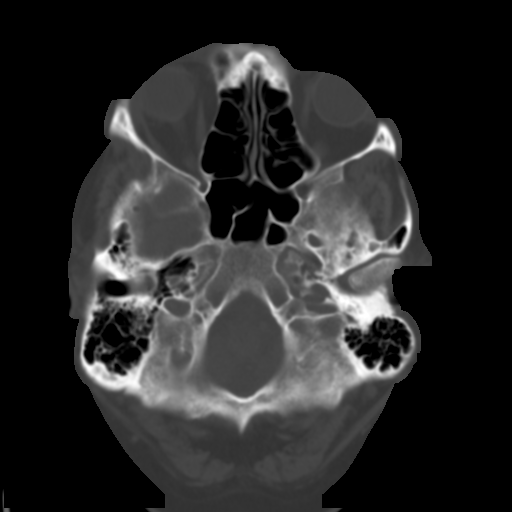
[im 6/32  brain]
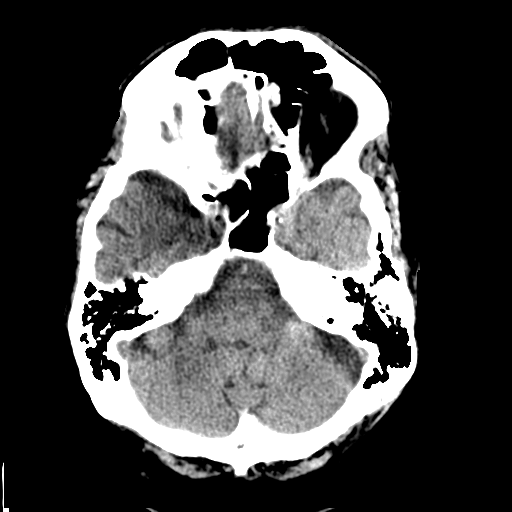
[im 9/32  brain]
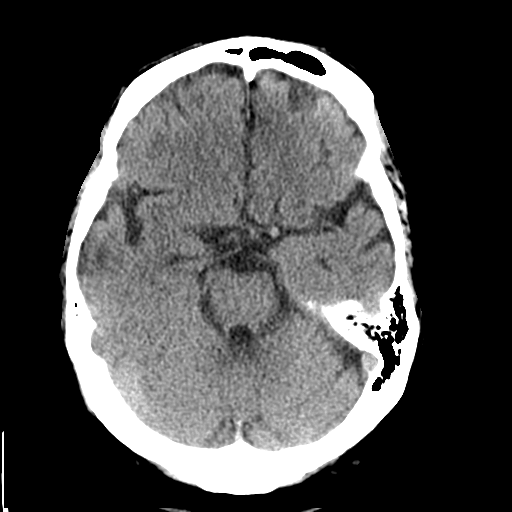
[im 11/32  brain]
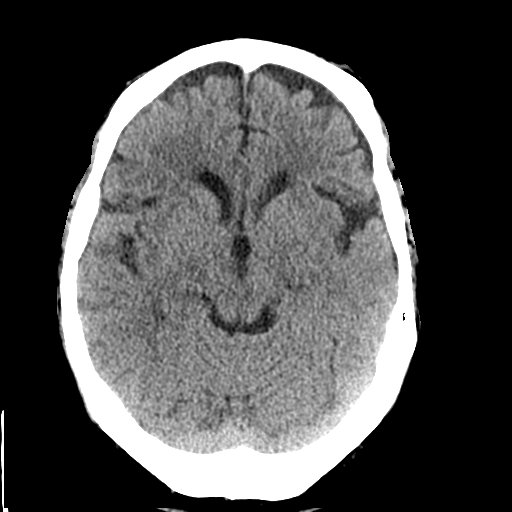
[im 14/32  brain]
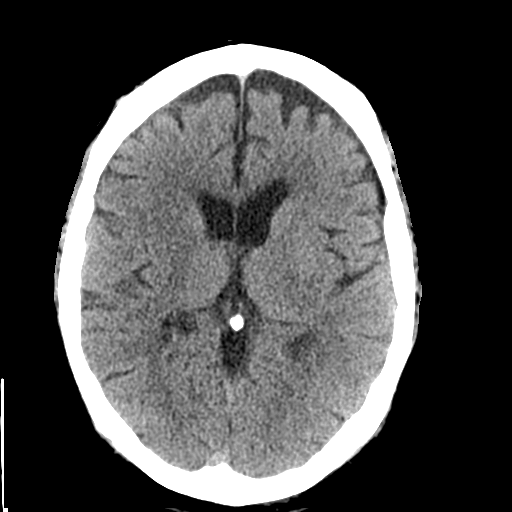
[im 14/32  bone]
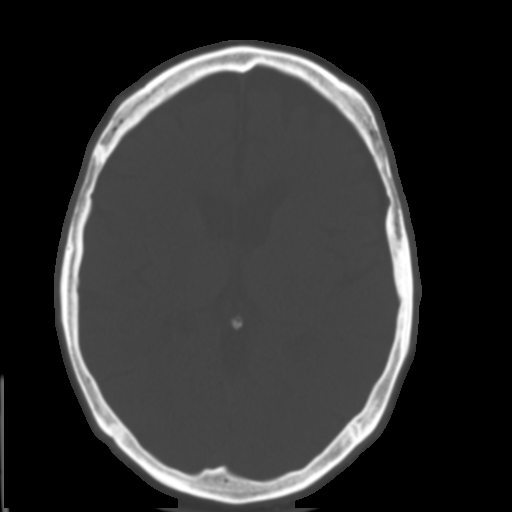
[im 18/32  brain]
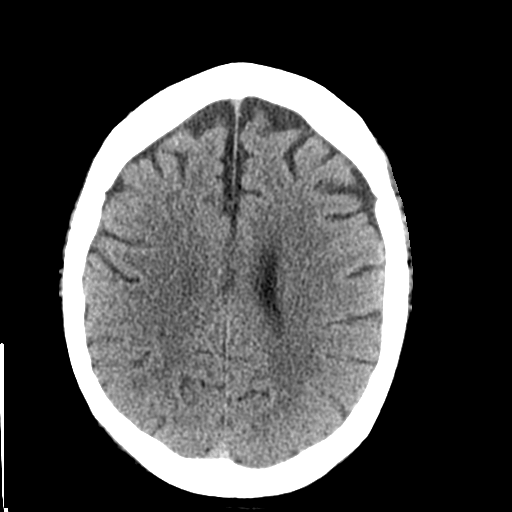
[im 21/32  brain]
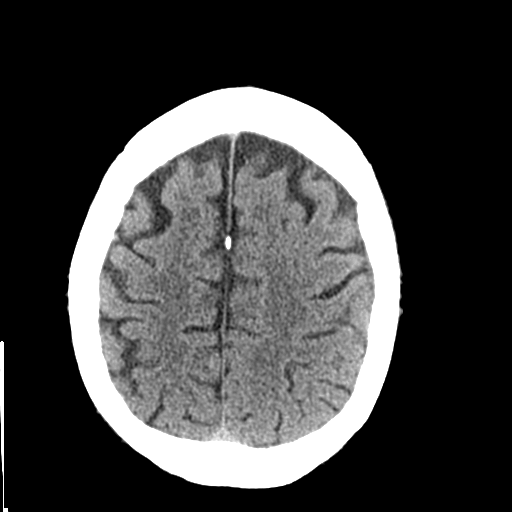
[im 24/32  brain]
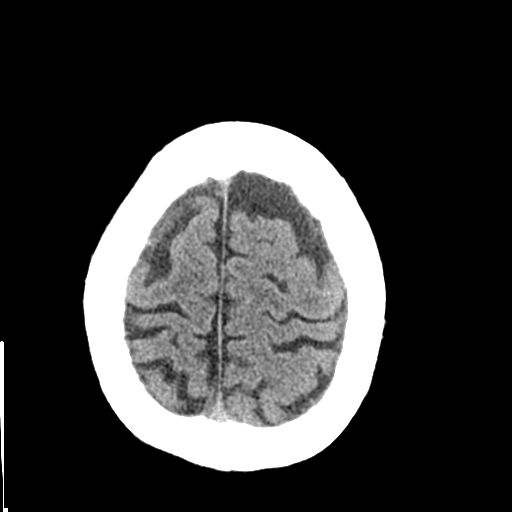
[im 26/32  brain]
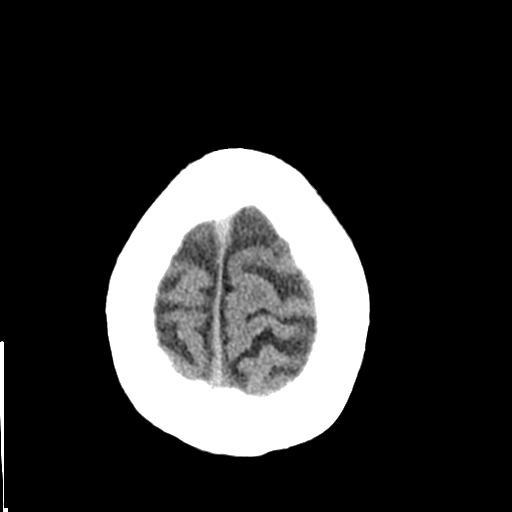
[im 26/32  bone]
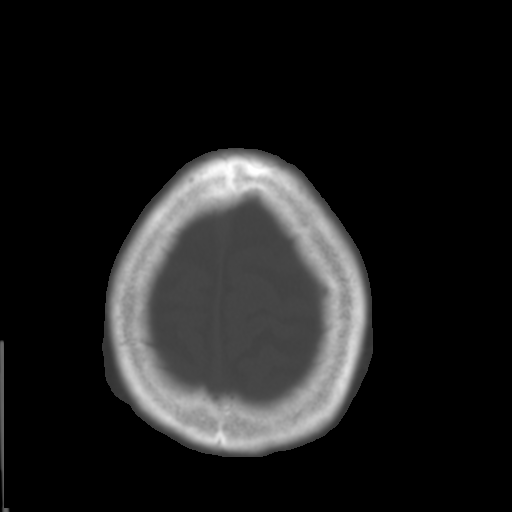
[im 29/32  brain]
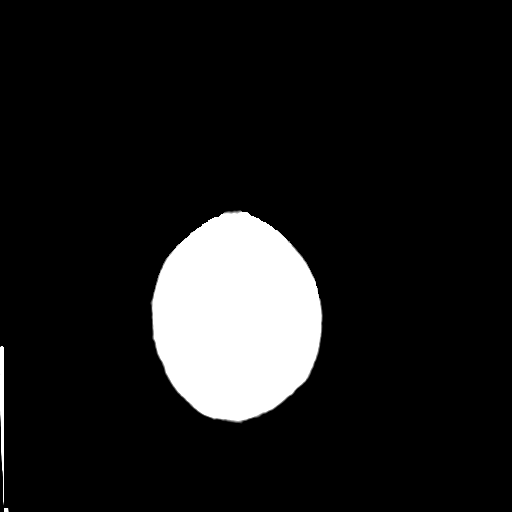

[Series 4: coronal soft tissue · coronal · 0.32mm/px · 3 of 73 slices shown]
[im 25/73  brain]
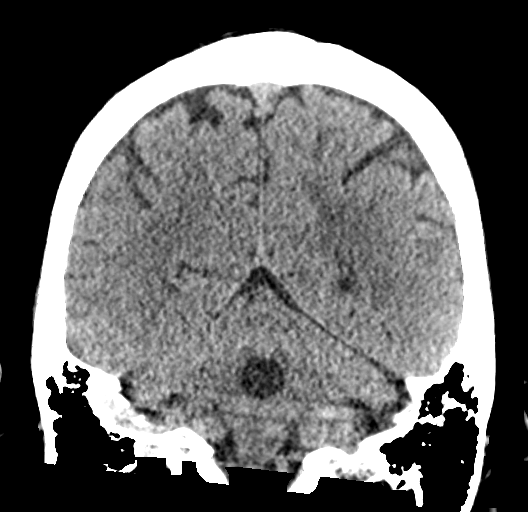
[im 33/73  brain]
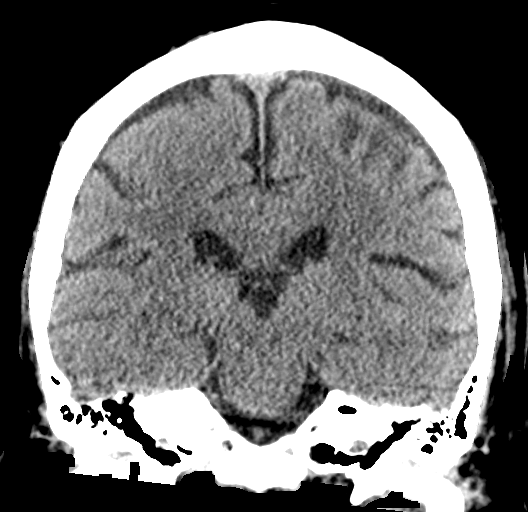
[im 41/73  brain]
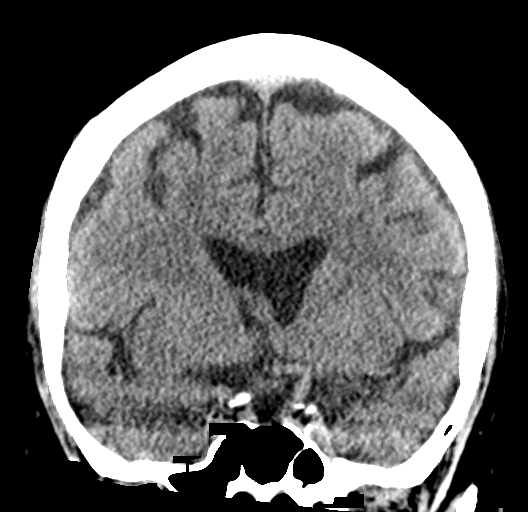

[Series 5: sagittal soft tissue · sagittal · 0.32mm/px · 3 of 56 slices shown]
[im 19/56  brain]
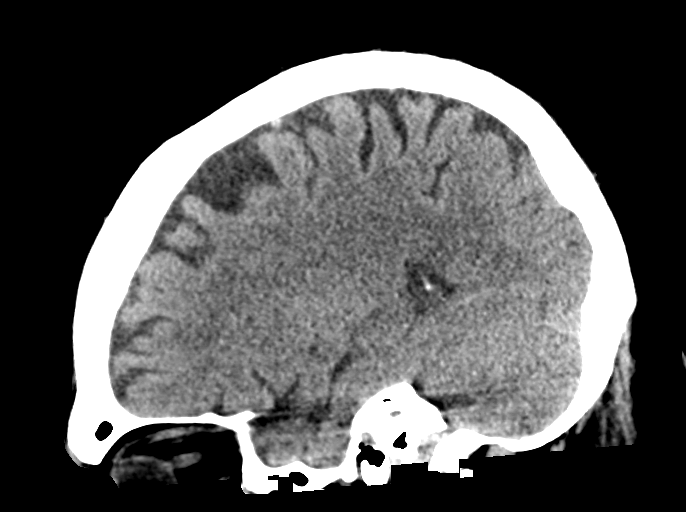
[im 28/56  brain]
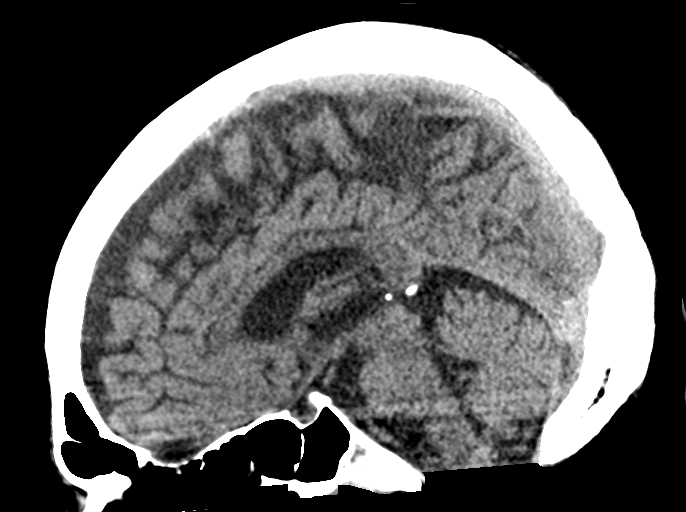
[im 37/56  brain]
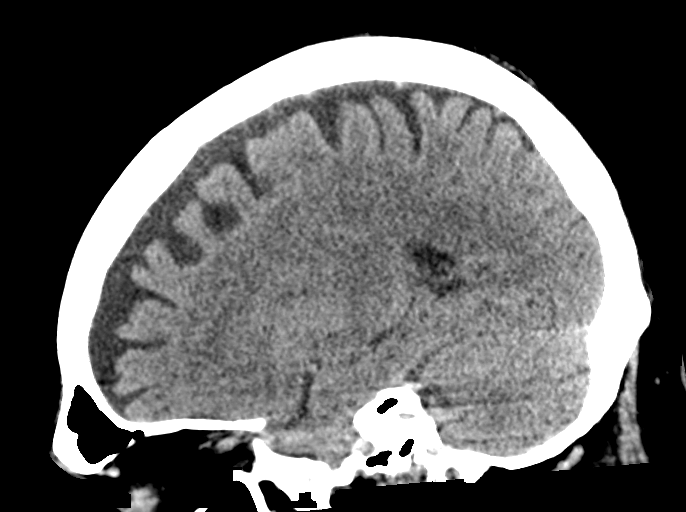

[16 of 47 positions shown; findings below may reference images not displayed]

FINDINGS: Brain: No evidence of acute infarction, hemorrhage, hydrocephalus,
extra-axial collection or mass lesion/mass effect. Minimal atrophy,
unchanged.

Vascular: No hyperdense vessel or unexpected calcification.

Skull: Normal. Negative for fracture or focal lesion.

Sinuses/Orbits: None

Other: None
IMPRESSION: No significant abnormalities.

## 2020-05-09 IMAGING — CR DG FOREARM 2V*L*
1 series · 2 of 2 positions shown · non-contrast
Comparison: None.

CLINICAL DATA: Patient c/o left forearm pain s/p fall today and
previous injury to arm from another fall.

EXAM:
LEFT FOREARM - 2 VIEW

[Series 1: dg forearm left · 0.14mm/px · 2 of 2 slices shown]
[im 1/2]
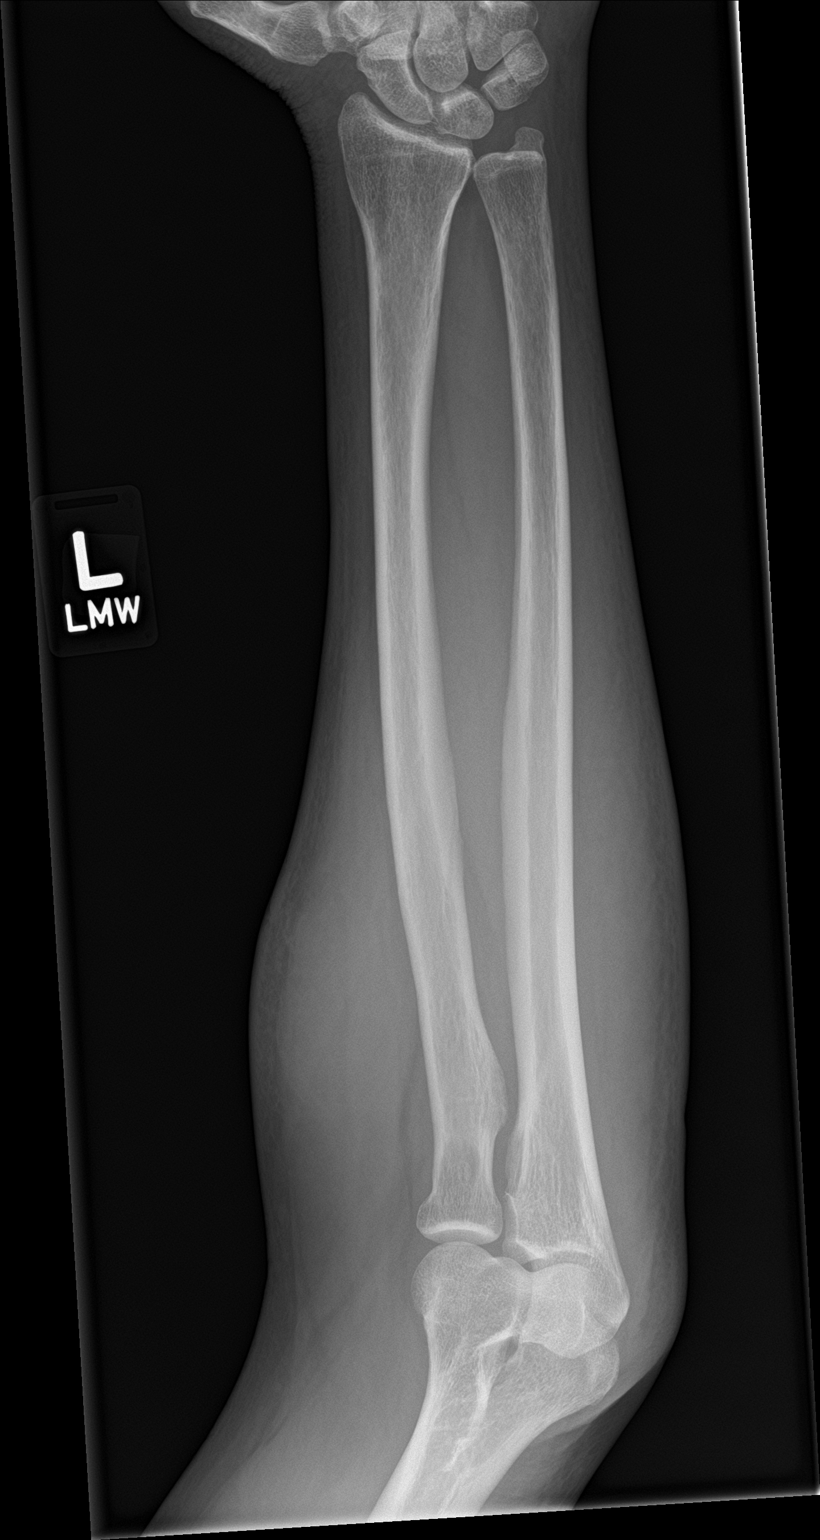
[im 2/2]
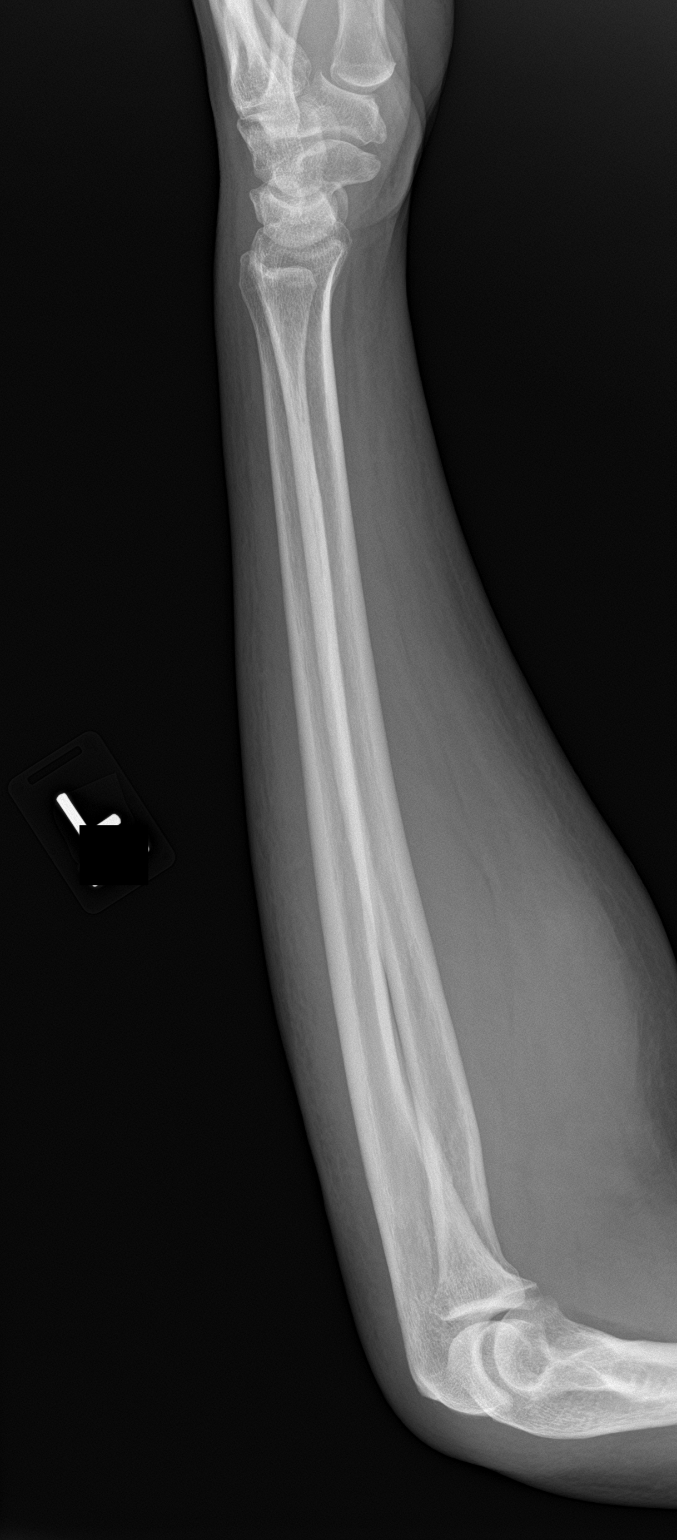

[2 of 2 positions shown; findings below may reference images not displayed]

FINDINGS: There is no evidence of fracture or other focal bone lesions in the
radius or ulna. Alignment appears intact at the wrist and elbow
joints. There is prominent proximal soft tissue swelling.
IMPRESSION: Proximal soft tissue swelling in the left arm without acute osseous
abnormality.

## 2020-05-10 ENCOUNTER — Other Ambulatory Visit: Payer: Self-pay

## 2020-05-10 ENCOUNTER — Emergency Department
Admission: EM | Admit: 2020-05-10 | Discharge: 2020-05-11 | Disposition: A | Discharge status: Home / Self Care | Payer: Medicaid Other | Attending: Emergency Medicine | Admitting: Emergency Medicine

## 2020-05-10 ENCOUNTER — Emergency Department: Payer: Medicaid Other

## 2020-05-10 DIAGNOSIS — Z87891 Personal history of nicotine dependence: Secondary | ICD-10-CM | POA: Insufficient documentation

## 2020-05-10 DIAGNOSIS — J698 Pneumonitis due to inhalation of other solids and liquids: Secondary | ICD-10-CM | POA: Insufficient documentation

## 2020-05-10 DIAGNOSIS — Z7951 Long term (current) use of inhaled steroids: Secondary | ICD-10-CM | POA: Insufficient documentation

## 2020-05-10 DIAGNOSIS — J680 Bronchitis and pneumonitis due to chemicals, gases, fumes and vapors: Secondary | ICD-10-CM

## 2020-05-10 DIAGNOSIS — I10 Essential (primary) hypertension: Secondary | ICD-10-CM | POA: Insufficient documentation

## 2020-05-10 DIAGNOSIS — F1020 Alcohol dependence, uncomplicated: Secondary | ICD-10-CM | POA: Insufficient documentation

## 2020-05-10 DIAGNOSIS — T65891A Toxic effect of other specified substances, accidental (unintentional), initial encounter: Secondary | ICD-10-CM | POA: Insufficient documentation

## 2020-05-10 DIAGNOSIS — Z79899 Other long term (current) drug therapy: Secondary | ICD-10-CM | POA: Insufficient documentation
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
## 2020-05-10 NOTE — ED Notes (Signed)
Pt reports symptoms began soon after exposure this AM. Pt reports mild congestion and mild nasal drainage. Pt reports dry cough. Pt reports tightness when taking taking deep breaths. Reports expiratory wheezes, used albuterol inhaler today without relief.   Pt lung sounds clear bilaterally

## 2020-05-10 NOTE — Discharge Instructions (Addendum)
Use your Albuterol inhaler 2 puffs every 4 hours as needed for cough/wheezing/difficulty breathing.  Return to the ER for worsening symptoms, persistent vomiting, difficulty breathing or other concerns.

## 2020-05-10 NOTE — ED Notes (Signed)
Pt reports tenderness to right flank/side, continuous since previous admission. Mild tenderness to RLQ RUQ
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
## 2020-05-22 ENCOUNTER — Other Ambulatory Visit: Payer: Medicaid Other

## 2020-05-22 ENCOUNTER — Other Ambulatory Visit: Payer: Self-pay

## 2020-05-22 DIAGNOSIS — Z0000 Encounter for general adult medical examination without abnormal findings: Secondary | ICD-10-CM

## 2020-05-22 DIAGNOSIS — N179 Acute kidney failure, unspecified: Secondary | ICD-10-CM

## 2020-05-23 ENCOUNTER — Ambulatory Visit: Payer: Medicaid Other | Admitting: Gerontology

## 2020-05-23 ENCOUNTER — Other Ambulatory Visit: Payer: Self-pay

## 2020-05-23 ENCOUNTER — Encounter: Payer: Self-pay | Admitting: *Deleted

## 2020-05-23 ENCOUNTER — Emergency Department: Payer: Medicaid Other

## 2020-05-23 DIAGNOSIS — R251 Tremor, unspecified: Secondary | ICD-10-CM | POA: Insufficient documentation

## 2020-05-23 DIAGNOSIS — I10 Essential (primary) hypertension: Secondary | ICD-10-CM | POA: Insufficient documentation

## 2020-05-23 DIAGNOSIS — Z20822 Contact with and (suspected) exposure to covid-19: Secondary | ICD-10-CM | POA: Insufficient documentation

## 2020-05-23 DIAGNOSIS — G479 Sleep disorder, unspecified: Secondary | ICD-10-CM | POA: Insufficient documentation

## 2020-05-23 DIAGNOSIS — R5383 Other fatigue: Secondary | ICD-10-CM | POA: Insufficient documentation

## 2020-05-23 DIAGNOSIS — Z87891 Personal history of nicotine dependence: Secondary | ICD-10-CM | POA: Insufficient documentation

## 2020-05-23 DIAGNOSIS — Z79899 Other long term (current) drug therapy: Secondary | ICD-10-CM | POA: Insufficient documentation

## 2020-05-23 LAB — BASIC METABOLIC PANEL
Anion gap: 10 (ref 5–15)
BUN: 31 mg/dL — ABNORMAL HIGH (ref 6–20)
CO2: 22 mmol/L (ref 22–32)
Calcium: 8.9 mg/dL (ref 8.9–10.3)
Chloride: 104 mmol/L (ref 98–111)
Creatinine, Ser: 1.94 mg/dL — ABNORMAL HIGH (ref 0.61–1.24)
GFR calc Af Amer: 42 mL/min — ABNORMAL LOW (ref >60)
GFR calc non Af Amer: 37 mL/min — ABNORMAL LOW (ref >60)
Glucose, Bld: 97 mg/dL (ref 70–99)
Potassium: 4.7 mmol/L (ref 3.5–5.1)
Sodium: 136 mmol/L (ref 135–145)

## 2020-05-23 LAB — COMPREHENSIVE METABOLIC PANEL
ALT: 8 IU/L (ref 0–44)
AST: 9 IU/L (ref 0–40)
Albumin/Globulin Ratio: 1.7 (ref 1.2–2.2)
Albumin: 4.5 g/dL (ref 3.8–4.9)
Alkaline Phosphatase: 90 IU/L (ref 48–121)
BUN/Creatinine Ratio: 16 (ref 10–24)
BUN: 29 mg/dL — ABNORMAL HIGH (ref 8–27)
Bilirubin Total: 0.8 mg/dL (ref 0.0–1.2)
CO2: 21 mmol/L (ref 20–29)
Calcium: 9.6 mg/dL (ref 8.6–10.2)
Chloride: 104 mmol/L (ref 96–106)
Creatinine, Ser: 1.86 mg/dL — ABNORMAL HIGH (ref 0.76–1.27)
GFR calc Af Amer: 44 mL/min/{1.73_m2} — ABNORMAL LOW (ref >59)
GFR calc non Af Amer: 38 mL/min/{1.73_m2} — ABNORMAL LOW (ref >59)
Globulin, Total: 2.6 g/dL (ref 1.5–4.5)
Glucose: 129 mg/dL — ABNORMAL HIGH (ref 65–99)
Potassium: 5.4 mmol/L — ABNORMAL HIGH (ref 3.5–5.2)
Sodium: 138 mmol/L (ref 134–144)
Total Protein: 7.1 g/dL (ref 6.0–8.5)

## 2020-05-23 LAB — LIPID PANEL
Chol/HDL Ratio: 2.4 ratio (ref 0.0–5.0)
Cholesterol, Total: 155 mg/dL (ref 100–199)
HDL: 65 mg/dL (ref >39)
LDL Chol Calc (NIH): 73 mg/dL (ref 0–99)
Triglycerides: 92 mg/dL (ref 0–149)
VLDL Cholesterol Cal: 17 mg/dL (ref 5–40)

## 2020-05-23 LAB — CBC WITH DIFFERENTIAL/PLATELET
Basophils Absolute: 0.1 10*3/uL (ref 0.0–0.2)
Basos: 1 %
EOS (ABSOLUTE): 0.2 10*3/uL (ref 0.0–0.4)
Eos: 2 %
Hematocrit: 37.8 % (ref 37.5–51.0)
Hemoglobin: 12.2 g/dL — ABNORMAL LOW (ref 13.0–17.7)
Immature Grans (Abs): 0 10*3/uL (ref 0.0–0.1)
Immature Granulocytes: 0 %
Lymphocytes Absolute: 1.2 10*3/uL (ref 0.7–3.1)
Lymphs: 15 %
MCH: 29.2 pg (ref 26.6–33.0)
MCHC: 32.3 g/dL (ref 31.5–35.7)
MCV: 90 fL (ref 79–97)
Monocytes Absolute: 0.9 10*3/uL (ref 0.1–0.9)
Monocytes: 12 %
Neutrophils Absolute: 5.5 10*3/uL (ref 1.4–7.0)
Neutrophils: 70 %
Platelets: 231 10*3/uL (ref 150–450)
RBC: 4.18 x10E6/uL (ref 4.14–5.80)
RDW: 15.5 % — ABNORMAL HIGH (ref 11.6–15.4)
WBC: 7.8 10*3/uL (ref 3.4–10.8)

## 2020-05-23 LAB — CBC
HCT: 36.1 % — ABNORMAL LOW (ref 39.0–52.0)
Hemoglobin: 12.3 g/dL — ABNORMAL LOW (ref 13.0–17.0)
MCH: 29.7 pg (ref 26.0–34.0)
MCHC: 34.1 g/dL (ref 30.0–36.0)
MCV: 87.2 fL (ref 80.0–100.0)
Platelets: 237 10*3/uL (ref 150–400)
RBC: 4.14 MIL/uL — ABNORMAL LOW (ref 4.22–5.81)
RDW: 17.5 % — ABNORMAL HIGH (ref 11.5–15.5)
WBC: 8 10*3/uL (ref 4.0–10.5)
nRBC: 0 % (ref 0.0–0.2)

## 2020-05-23 LAB — TROPONIN I (HIGH SENSITIVITY): Troponin I (High Sensitivity): 2 ng/L (ref <18)

## 2020-05-23 MED ORDER — SODIUM CHLORIDE 0.9% FLUSH: 3.0000 mL | Freq: Once | INTRAVENOUS | Status: DC

## 2020-05-23 NOTE — ED Triage Notes (Signed)
Pt states ETOH consumption today.

## 2020-05-23 NOTE — ED Triage Notes (Signed)
Pt states he is short of breath and abdominal pain. Pt is presently in no respiratory distress. Pt has multiple other complaints that are tangentially related. Pt c/o chest and abdominal pain and nausea. Pt states he missed his PCP appointment today because he woke up too early.

## 2020-05-24 ENCOUNTER — Emergency Department
Admission: EM | Admit: 2020-05-24 | Discharge: 2020-05-24 | Disposition: A | Discharge status: Home / Self Care | Payer: Medicaid Other | Attending: Emergency Medicine | Admitting: Emergency Medicine

## 2020-05-24 ENCOUNTER — Other Ambulatory Visit: Payer: Medicaid Other | Attending: Gastroenterology

## 2020-05-24 DIAGNOSIS — G479 Sleep disorder, unspecified: Secondary | ICD-10-CM

## 2020-05-24 DIAGNOSIS — G252 Other specified forms of tremor: Secondary | ICD-10-CM

## 2020-05-24 LAB — TROPONIN I (HIGH SENSITIVITY): Troponin I (High Sensitivity): 4 ng/L (ref <18)

## 2020-05-24 LAB — SARS CORONAVIRUS 2 (TAT 6-24 HRS): SARS Coronavirus 2: NEGATIVE

## 2020-05-24 MED ORDER — CHLORDIAZEPOXIDE HCL 25 MG PO CAPS: 25.0000 mg | ORAL_CAPSULE | Freq: Two times a day (BID) | ORAL | 0 refills | Status: DC | PRN

## 2020-05-24 MED ORDER — CHLORDIAZEPOXIDE HCL 25 MG PO CAPS
25.0000 mg | ORAL_CAPSULE | Freq: Once | ORAL | Status: AC
  Administered 2020-05-24: 25 mg via ORAL
  Filled 2020-05-24: qty 1

## 2020-05-24 NOTE — Discharge Instructions (Addendum)
At night:  Take the MELATONIN about 30 minutes before going to sleep.  After taking the melatonin, rest in a dark room.  You can take the LIBRIUM at night to help with sleep as well, and once during the day for anxiety if needed. DO NOT DRINK ALCOHOL WHILE TAKING THIS.

## 2020-05-24 NOTE — ED Notes (Signed)
Patient discharged to home per MD order. Patient in stable condition, and deemed medically cleared by ED provider for discharge. Discharge instructions reviewed with patient/family using "Teach Back"; verbalized understanding of medication education and administration, and information about follow-up care. Denies further concerns. ° °

## 2020-05-24 NOTE — ED Notes (Signed)
Pt in with co shob, none noted at this time. States he has been having trouble sleeping and has been taking care of his sick wife.

## 2020-05-24 NOTE — ED Provider Notes (Signed)
[facility] Emergency Department Provider Note  ____________________________________________   First MD Initiated Contact with Patient 05/24/20 [phone]     (approximate)  I have reviewed the triage vital signs and the nursing notes.   HISTORY  Chief Complaint Shortness of Breath    HPI Miguel [name] is a 59 y.o. male with extensive past medical history including chronic alcohol dependence, anxiety, here with multiple complaints.  The patient's primary complaint is increasing mild tremors when he wakes up for the last several days.  He is also had increasing anxiety.  He also reports that he feels short of breath during these episodes but this has come and gone.  Of note, the patient was recently seen for possible exposure to cleaning chemicals with a mild pneumonitis but he states his symptoms have largely resolved since then.  He states that he has significantly cut back on his alcohol recently, and he does appear very well.  He states he has been noticing that when he wakes up in the mornings, he wakes up very early and feels anxious with some tremors.  The symptoms are new to him.  He has been trying to take melatonin but states he takes it in the mornings when he wakes up, rather than at night.  No homicidal or suicidal ideation.  No other complaints.        Past Medical History:  Diagnosis Date  . Alcohol abuse   . Anxiety   . Asthma   . GERD (gastroesophageal reflux disease)   . Gout   . Hypertension   . Kidney stone   . OCD (obsessive compulsive disorder)   . Renal colic     Patient Active Problem List   Diagnosis Date Noted  . Abdominal pain 04/12/2020  . Hypertensive urgency 04/12/2020  . Alcohol withdrawal ([place]) 03/04/2020  . Right-sided chest wall pain 02/28/2020  . Alcohol abuse with alcohol-induced mood disorder ([place]) 12/02/2019  . Moderate episode of recurrent major depressive disorder ([place])   . Health care maintenance 10/26/2019  .  Right knee pain 10/26/2019  . Generalized anxiety disorder 10/14/2019  . MDD (major depressive disorder), recurrent severe, without psychosis ([place]) 07/27/2019  . Social anxiety disorder 07/27/2019  . Left-sided chest wall pain 06/14/2019  . Alcoholic cirrhosis of liver without ascites ([place]) 06/14/2019  . Alcohol abuse 06/14/2019  . AKI (acute kidney injury) ([name]) 05/27/2019  . Alcohol withdrawal syndrome without complication ([place]) 96/29/5284  . Alcoholic intoxication without complication ([place])   . Sepsis ([place]) 03/08/2019  . Breast lump or mass 10/04/2018  . Sepsis secondary to UTI ([place]) 09/14/2018  . Asthma 07/07/2018  . GERD (gastroesophageal reflux disease) 07/07/2018  . OCD (obsessive compulsive disorder) 07/07/2018  . Self-inflicted laceration of left wrist ([place]) 03/10/2018  . Leg hematoma 12/25/2017  . Suicide and self-inflicted injury by cutting and piercing instrument ([place]) 03/10/2017  . Severe recurrent major 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 ([place]) 03/09/2017  . Substance induced mood disorder ([place]) 08/15/2016  . Involuntary commitment 08/15/2016  . Alcohol use disorder, severe, dependence ([place]) 02/05/2016  . Hypertension 12/05/2015  . Tachycardia 12/05/2015  . Gout 11/13/2015  . Chronic back pain 05/02/2015    Past Surgical History:  Procedure Laterality Date  . CHOLECYSTECTOMY  2012  . EXTRACORPOREAL SHOCK WAVE LITHOTRIPSY Left 01/12/2019   Procedure: EXTRACORPOREAL SHOCK WAVE LITHOTRIPSY (ESWL);  Surgeon: Billey Co, MD;  Location: [facility] ORS;  Service: Urology;  Laterality: Left;    Prior to Admission medications  Medication Sig Start Date End Date Taking? Authorizing Provider  albuterol (VENTOLIN HFA) 108 (90 Base) MCG/ACT inhaler Inhale 2 puffs into the lungs every 6 (six) hours as needed. 03/21/20   Clapacs, Madie Reno, MD  allopurinol (ZYLOPRIM) 300 MG tablet Take 1 tablet (300 mg total) by mouth daily. 04/23/20   Iloabachie, Chioma E, NP  ascorbic acid  (VITAMIN C) 500 MG tablet Take by mouth. 08/01/19   [provider]  chlordiazePOXIDE (LIBRIUM) 25 MG capsule Take 1 capsule (25 mg total) by mouth 2 (two) times daily as needed for anxiety (sleep). 05/24/20   Duffy Bruce, MD  citalopram (CELEXA) 20 MG tablet Take 1 tablet (20 mg total) by mouth daily. 03/21/20 03/21/21  Clapacs, Madie Reno, MD  cloNIDine (CATAPRES) 0.1 MG tablet Take 0.1 mg by mouth daily.    [provider]  colchicine 0.6 MG tablet Take by mouth. 08/01/19   [provider]  Fluticasone-Salmeterol (ADVAIR DISKUS) 100-50 MCG/DOSE AEPB Inhale 1 puff into the lungs 2 (two) times daily. 03/21/20   Clapacs, Madie Reno, MD  folic acid (FOLVITE) 1 MG tablet Take 1 mg by mouth daily.  08/01/19   [provider]  lisinopril (ZESTRIL) 40 MG tablet Take 1 tablet (40 mg total) by mouth daily. 04/23/20   Iloabachie, Chioma E, NP  omeprazole (PRILOSEC) 10 MG capsule Take 1 capsule (10 mg total) by mouth daily. 04/21/20   Cuthriell, Charline Bills, PA-C  ondansetron (ZOFRAN-ODT) 4 MG disintegrating tablet Take 1 tablet (4 mg total) by mouth every 8 (eight) hours as needed for nausea or vomiting. 04/21/20   Cuthriell, Charline Bills, PA-C  pantoprazole (PROTONIX) 40 MG tablet Take 1 tablet (40 mg total) by mouth daily. 03/21/20   Clapacs, Madie Reno, MD  thiamine 100 MG tablet Take 1 tablet (100 mg total) by mouth daily. 03/22/20   Clapacs, Madie Reno, MD    Allergies Percocet [oxycodone-acetaminophen]  Family History  Problem Relation Age of Onset  . Alcohol abuse Father   . Breast cancer Mother 58    Social History Social History   Tobacco Use  . Smoking status: Former Smoker    Types: Cigarettes  . Smokeless tobacco: Never Used  . Tobacco comment: quit 30 years ago  Vaping Use  . Vaping Use: Never used  Substance Use Topics  . Alcohol use: Not Currently    Alcohol/week: 12.0 standard drinks    Types: 12 Shots of liquor per week    Comment: pint daily  . Drug use: No     Review of Systems  Review of Systems  Constitutional: Positive for fatigue. Negative for chills and fever.  HENT: Negative for sore throat.   Respiratory: Positive for shortness of breath.   Cardiovascular: Negative for chest pain.  Gastrointestinal: Negative for abdominal pain.  Genitourinary: Negative for flank pain.  Musculoskeletal: Negative for neck pain.  Skin: Negative for rash and wound.  Allergic/Immunologic: Negative for immunocompromised state.  Neurological: Negative for weakness and numbness.  Hematological: Does not bruise/bleed easily.  Psychiatric/Behavioral: Positive for sleep disturbance. The patient is nervous/anxious.   All other systems reviewed and are negative.    ____________________________________________  PHYSICAL EXAM:      VITAL SIGNS: ED Triage Vitals  Enc Vitals Group     BP 05/23/20 2220 110/89     Pulse Rate 05/23/20 2220 95     Resp 05/23/20 2220 16     Temp 05/23/20 2220 98.3 F (36.8 C)     Temp  Source 05/23/20 2220 Oral     SpO2 05/23/20 2220 97 %     Weight 05/23/20 2221 186 lb 15.2 oz (84.8 kg)     Height 05/23/20 2221 6' (1.829 m)     Head Circumference --      Peak Flow --      Pain Score 05/23/20 2220 5     Pain Loc --      Pain Edu? --      Excl. in [place]? --      Physical Exam Vitals and nursing note reviewed.  Constitutional:      General: He is not in acute distress.    Appearance: He is well-developed.  HENT:     Head: Normocephalic and atraumatic.  Eyes:     Conjunctiva/sclera: Conjunctivae normal.  Cardiovascular:     Rate and Rhythm: Normal rate and regular rhythm.     Heart sounds: Normal heart sounds. No murmur heard.  No friction rub.  Pulmonary:     Effort: Pulmonary effort is normal. No respiratory distress.     Breath sounds: Normal breath sounds. No wheezing or rales.  Abdominal:     General: There is no distension.     Palpations: Abdomen is soft.     Tenderness: There is no abdominal tenderness.   Musculoskeletal:     Cervical back: Neck supple.  Skin:    General: Skin is warm.     Capillary Refill: Capillary refill takes less than 2 seconds.  Neurological:     Mental Status: He is alert and oriented to person, place, and time.     Motor: No abnormal muscle tone.     Comments: Mildly coarse tremor noted       ____________________________________________   LABS (all labs ordered are listed, but only abnormal results are displayed)  Labs Reviewed  BASIC METABOLIC PANEL - Abnormal; Notable for the following components:      Result Value   BUN 31 (*)    Creatinine, Ser 1.94 (*)    GFR calc non Af Amer 37 (*)    GFR calc Af Amer 42 (*)    All other components within normal limits  CBC - Abnormal; Notable for the following components:   RBC 4.14 (*)    Hemoglobin 12.3 (*)    HCT 36.1 (*)    RDW 17.5 (*)    All other components within normal limits  SARS CORONAVIRUS 2 (TAT 6-24 HRS)  TROPONIN I (HIGH SENSITIVITY)  TROPONIN I (HIGH SENSITIVITY)    ____________________________________________  EKG: Normal sinus rhythm, ventricular 96.  PR 148, QRS 108, QTc 459.  No acute ST elevations or depressions.  No EKG evidence of acute ischemia or infarct. ________________________________________  RADIOLOGY All imaging, including plain films, CT scans, and ultrasounds, independently reviewed by me, and interpretations confirmed via formal radiology reads.  ED MD interpretation:   Chest x-ray: Clear, no acute abnormality, mild left basilar atelectasis  Official radiology report(s): DG Chest 2 View  Result Date: 05/23/2020 CLINICAL DATA:  Shortness of breath EXAM: CHEST - 2 VIEW COMPARISON:  05/10/2020 FINDINGS: Cardiac shadow is stable. The lungs are well aerated bilaterally. Minimal left basilar atelectasis is seen. No sizable effusion or confluent infiltrate is noted. Degenerative changes of the thoracic spine are seen. IMPRESSION: Minimal left basilar atelectasis.  Electronically Signed   By: Inez Catalina M.D.   On: 05/23/2020 22:45    ____________________________________________  PROCEDURES   Procedure(s) performed (including Critical Care):  Procedures  ____________________________________________  INITIAL IMPRESSION / MDM / ASSESSMENT AND PLAN / ED COURSE  As part of my medical decision making, I reviewed the following data within the [facility] notes reviewed and incorporated, Old chart reviewed, Notes from prior ED visits, and [place] Controlled Substance Database       *GAITHER BIEHN was evaluated in Emergency Department on 05/24/2020 for the symptoms described in the history of present illness. He was evaluated in the context of the global COVID-19 pandemic, which necessitated consideration that the patient might be at risk for infection with the SARS-CoV-2 virus that causes COVID-19. Institutional protocols and algorithms that pertain to the evaluation of patients at risk for COVID-19 are in a state of rapid change based on information released by regulatory bodies including the CDC and federal and state organizations. These policies and algorithms were followed during the patient's care in the ED.  Some ED evaluations and interventions may be delayed as a result of limited staffing during the pandemic.*     Medical Decision Making:  59 yo M here with mild tremors and difficulty sleeping. Pt is well known to this ED and appears overall very well compared to my prior assessments of him. He has cut back his drinking and I suspect his sx are related to mild withdrawal and insomnia related to this. Clinically, he appears well and his labs, vitals are reassuring. His SOB is episodic and his CXR is clear, with clear lung sounds and no hypoxia. He has stable vitals and shows no signs of significant alcohol w/d. I had a long discussion with him encouraging his continued reduced alcohol intake. We discussed management options and given  his symptoms, feel it's reasonable to trail a low-dose librium at night as his sx seem to be related to not drinking heavily before bed, which is new for him. Otherwise, no apparent emergent pathology and pt denies other complaints.  ____________________________________________  FINAL CLINICAL IMPRESSION(S) / ED DIAGNOSES  Final diagnoses:  Coarse tremors  Sleep difficulties     MEDICATIONS GIVEN DURING THIS VISIT:  Medications  sodium chloride flush (NS) 0.9 % injection 3 mL (has no administration in time range)  chlordiazePOXIDE (LIBRIUM) capsule 25 mg (25 mg Oral Given 05/24/20 0405)     ED Discharge Orders         Ordered    chlordiazePOXIDE (LIBRIUM) 25 MG capsule  2 times daily PRN     Discontinue  Reprint     05/24/20 0539           Note:  This document was prepared using Dragon voice recognition software and may include unintentional dictation errors.   Duffy Bruce, MD 05/24/20 [phone]

## 2020-05-26 IMAGING — DX DG FOREARM 2V*L*
2 series · 2 of 2 positions shown · non-contrast
Comparison: None.

CLINICAL DATA: Pt states fell 3 days ago injuring left arm. Pt
complains of wrist and forearm pain. Pt appears in no acute
distress. best images attainable, pt shakey and unsteady in lateral
position

EXAM:
LEFT FOREARM - 2 VIEW; LEFT WRIST - COMPLETE 3+ VIEW; LEFT HAND -
COMPLETE 3+ VIEW

[forearm ap]
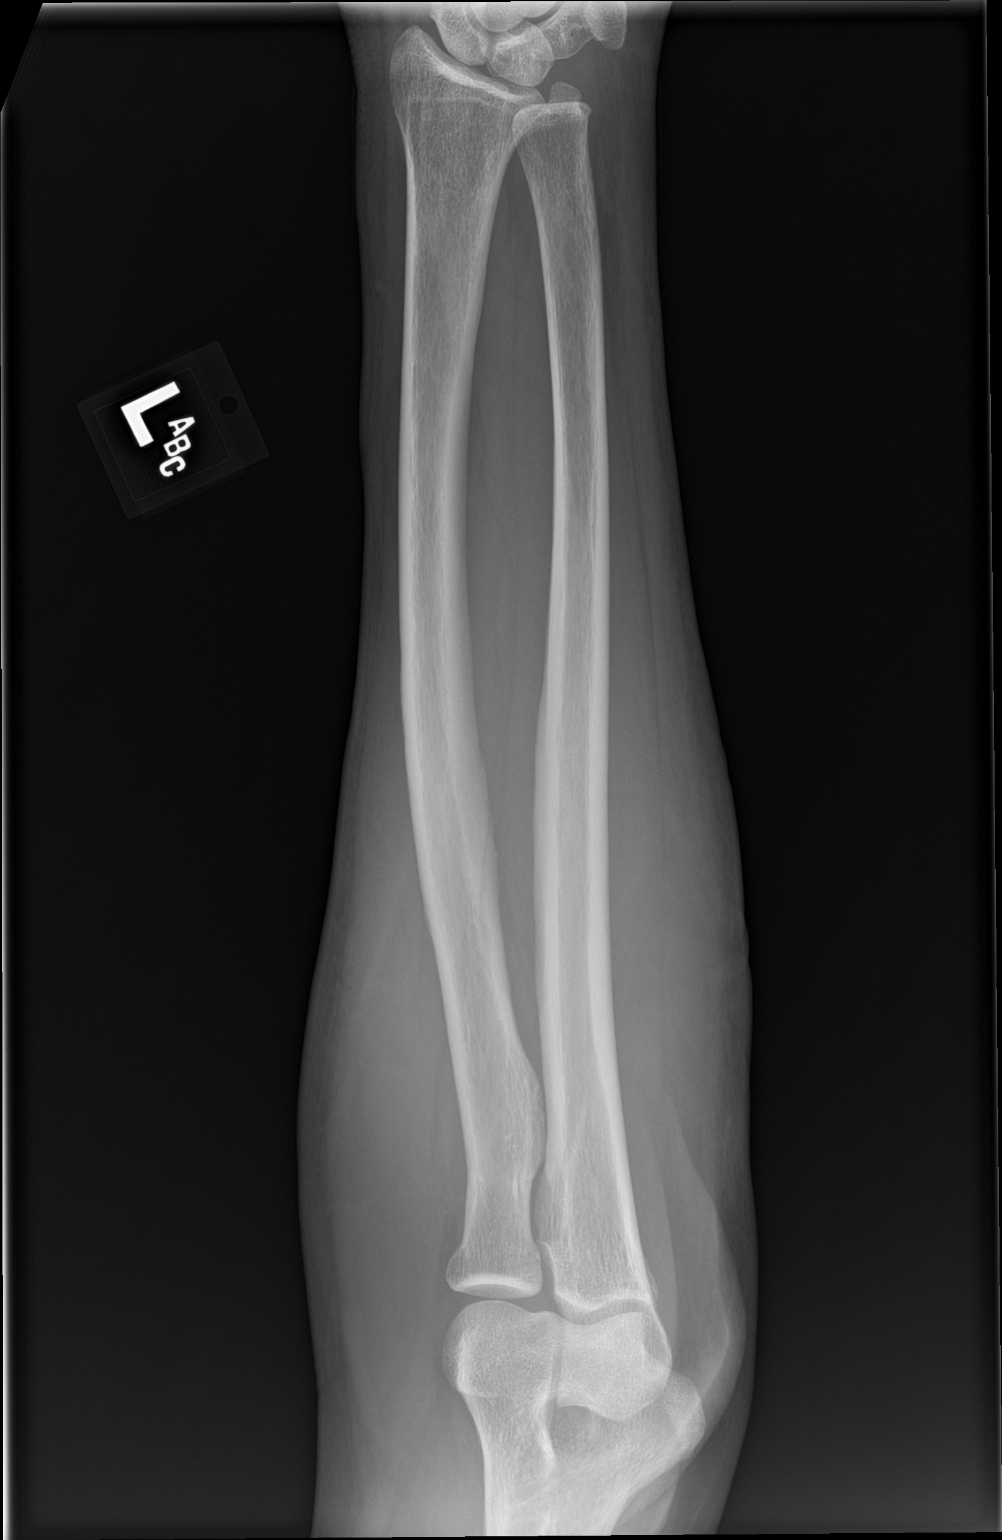

[forearm lat]
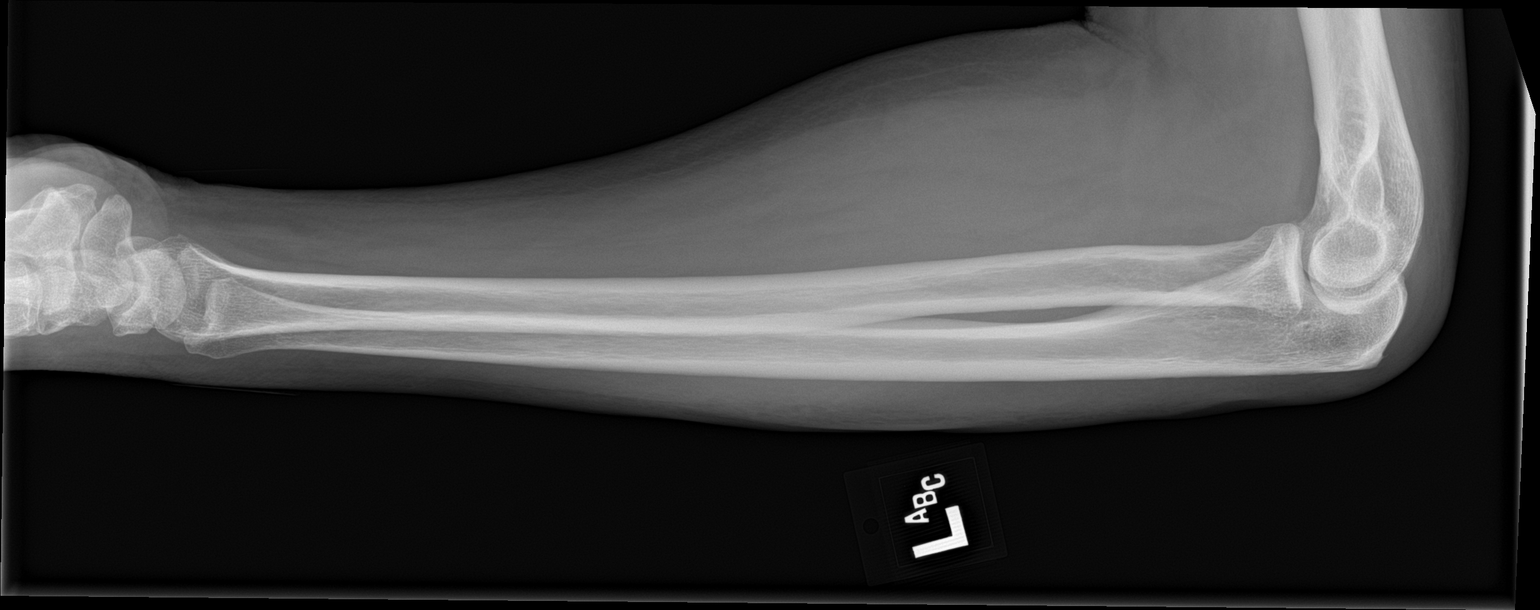

[2 of 2 positions shown; findings below may reference images not displayed]

FINDINGS: Right hand: No fracture or dislocation in the hand. No focal bony
lesion. Minimal degenerative change. Soft tissues are unremarkable.

Right wrist: There is a small displaced bony fragment at the base of
the fourth metatarsal, lateral aspect. There is possible
intra-articular involvement. No evidence of dislocation. No
additional fractures are identified in the wrist.

Right forearm: No evidence of fracture or dislocation in the tibia
or fibula. Regional soft tissues are unremarkable.
IMPRESSION: 1. Small displaced fracture fragment at the base of the fourth
metatarsal of the left hand, lateral aspect. Possible
intra-articular involvement.
2. No additional acute osseous abnormality in the hand, wrist or
forearm.

## 2020-05-26 IMAGING — DX DG HAND COMPLETE 3+V*L*
3 series · 3 of 3 positions shown · non-contrast
Comparison: None.

CLINICAL DATA: Pt states fell 3 days ago injuring left arm. Pt
complains of wrist and forearm pain. Pt appears in no acute
distress. best images attainable, pt shakey and unsteady in lateral
position

EXAM:
LEFT FOREARM - 2 VIEW; LEFT WRIST - COMPLETE 3+ VIEW; LEFT HAND -
COMPLETE 3+ VIEW

[hand ap]
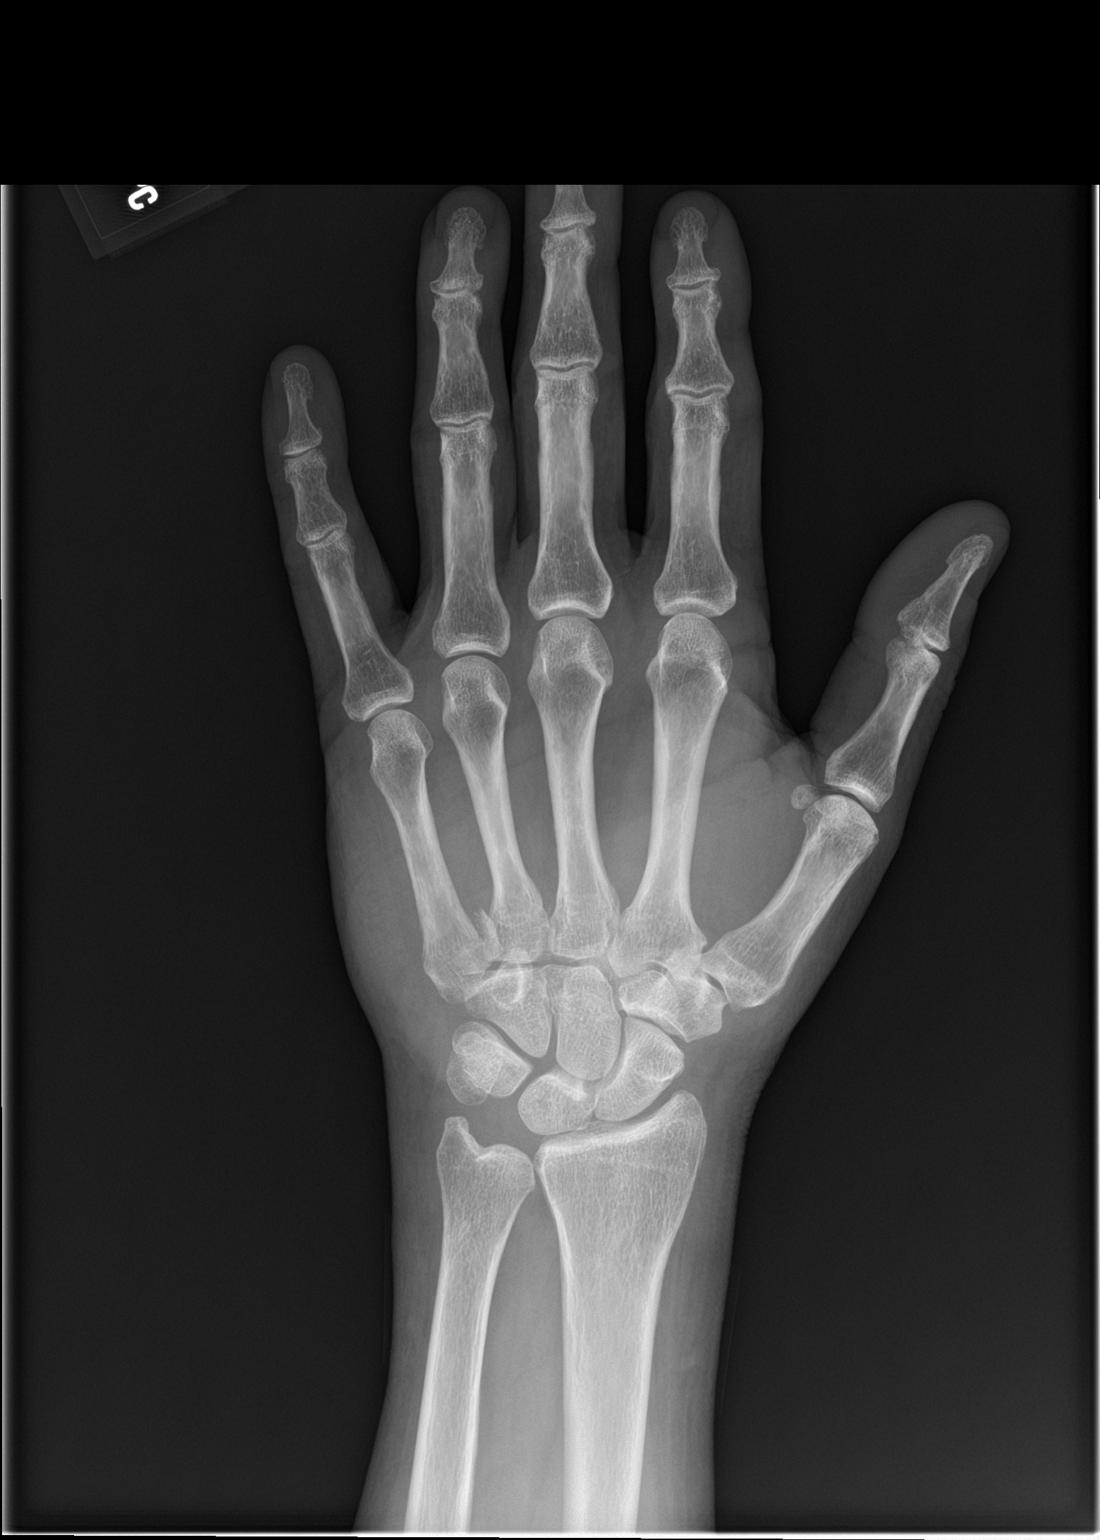

[hand obl]
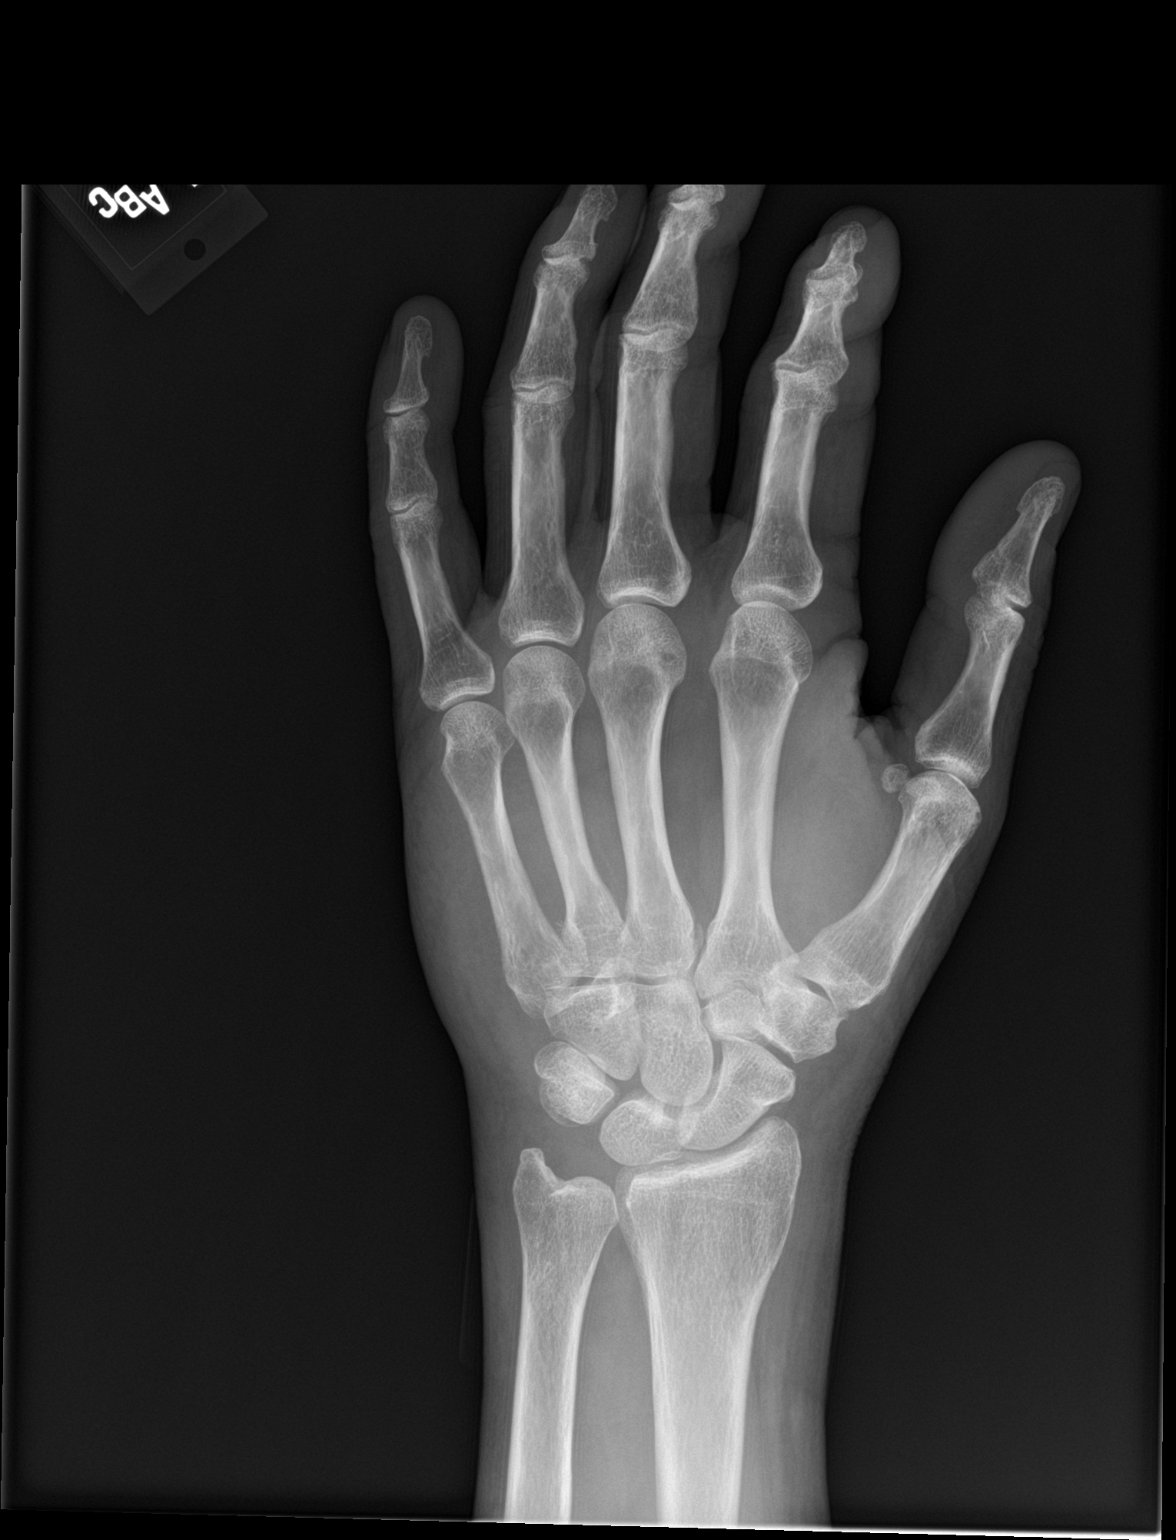

[hand lat]
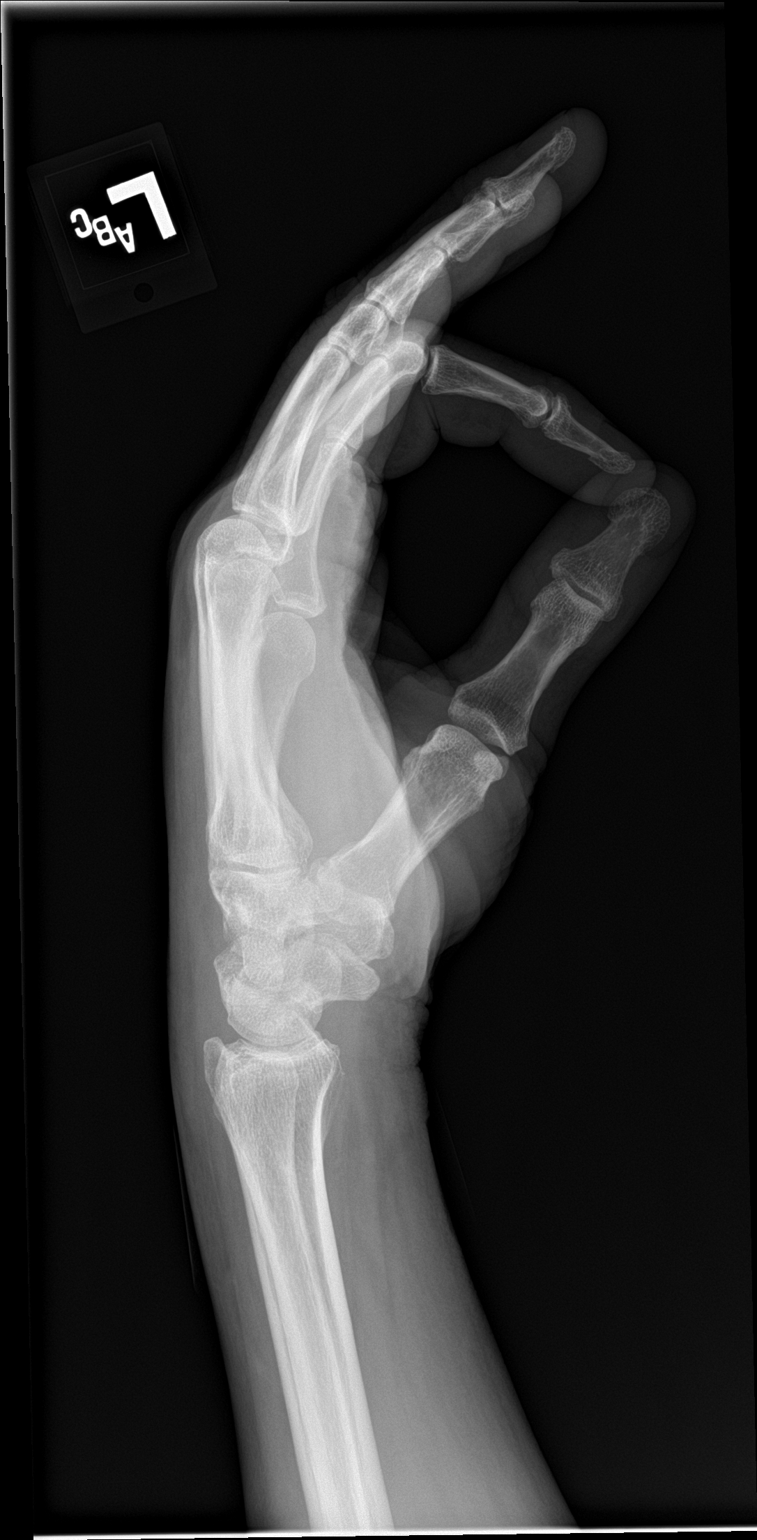

[3 of 3 positions shown; findings below may reference images not displayed]

FINDINGS: Right hand: No fracture or dislocation in the hand. No focal bony
lesion. Minimal degenerative change. Soft tissues are unremarkable.

Right wrist: There is a small displaced bony fragment at the base of
the fourth metatarsal, lateral aspect. There is possible
intra-articular involvement. No evidence of dislocation. No
additional fractures are identified in the wrist.

Right forearm: No evidence of fracture or dislocation in the tibia
or fibula. Regional soft tissues are unremarkable.
IMPRESSION: 1. Small displaced fracture fragment at the base of the fourth
metatarsal of the left hand, lateral aspect. Possible
intra-articular involvement.
2. No additional acute osseous abnormality in the hand, wrist or
forearm.

## 2020-05-26 IMAGING — DX DG WRIST COMPLETE 3+V*L*
4 series · 4 of 4 positions shown · non-contrast
Comparison: None.

CLINICAL DATA: Pt states fell 3 days ago injuring left arm. Pt
complains of wrist and forearm pain. Pt appears in no acute
distress. best images attainable, pt shakey and unsteady in lateral
position

EXAM:
LEFT FOREARM - 2 VIEW; LEFT WRIST - COMPLETE 3+ VIEW; LEFT HAND -
COMPLETE 3+ VIEW

[wrist ap (1 of 2)]
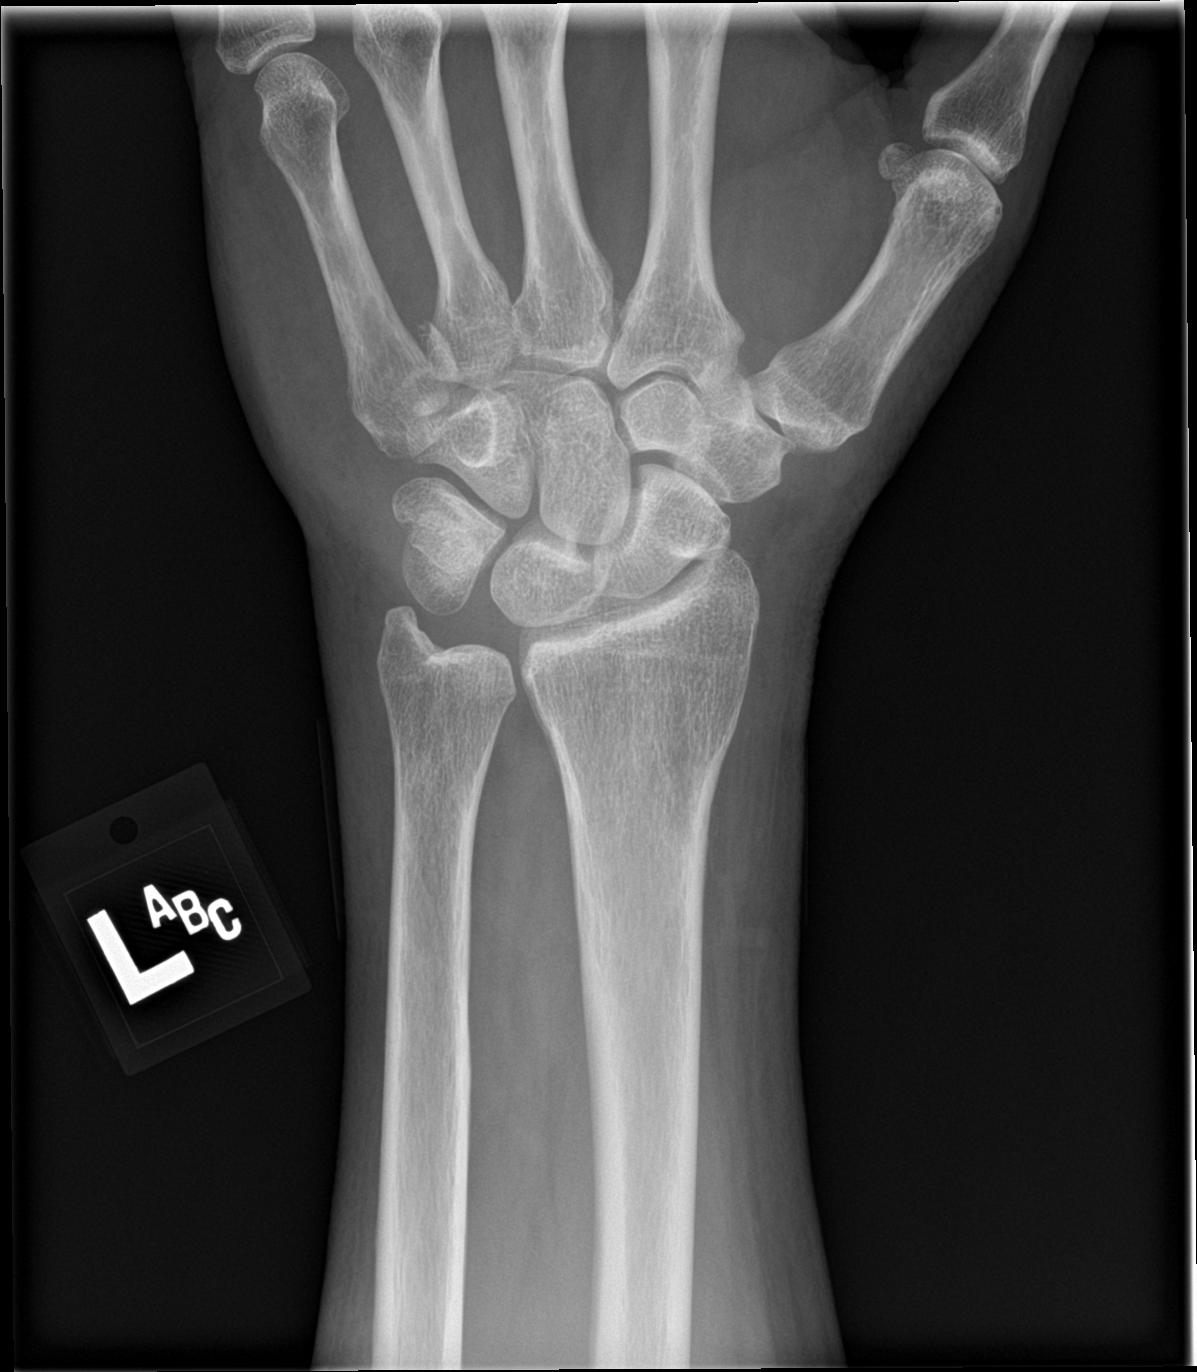

[wrist obl]
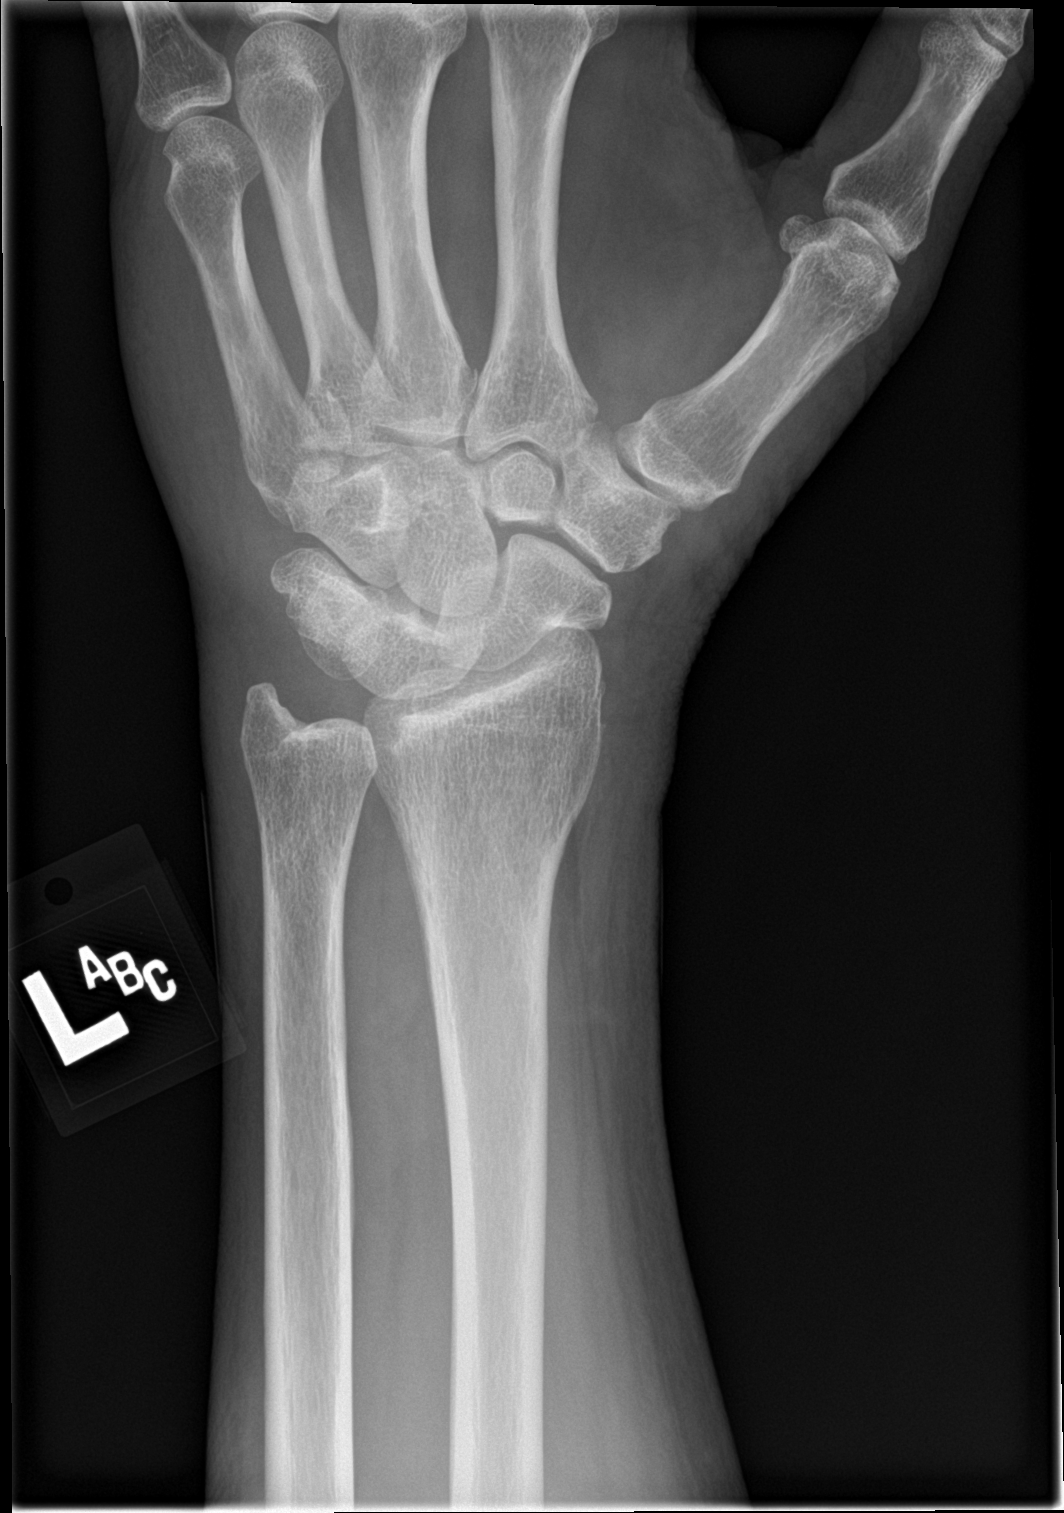

[wrist lat]
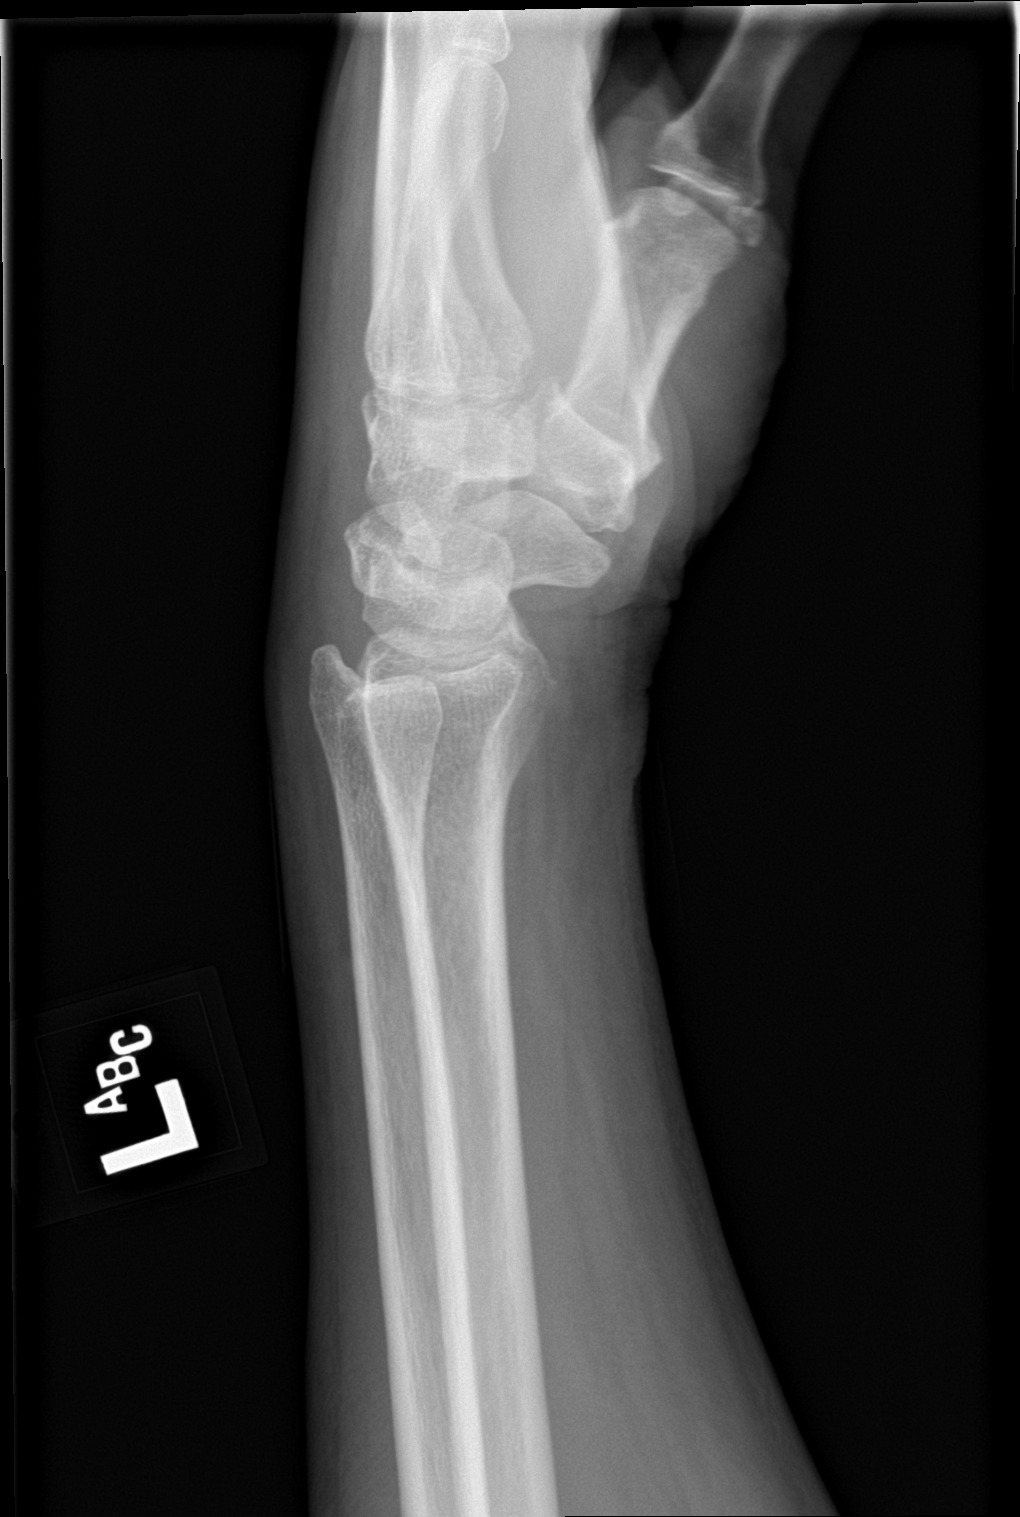

[wrist ap (2 of 2)]
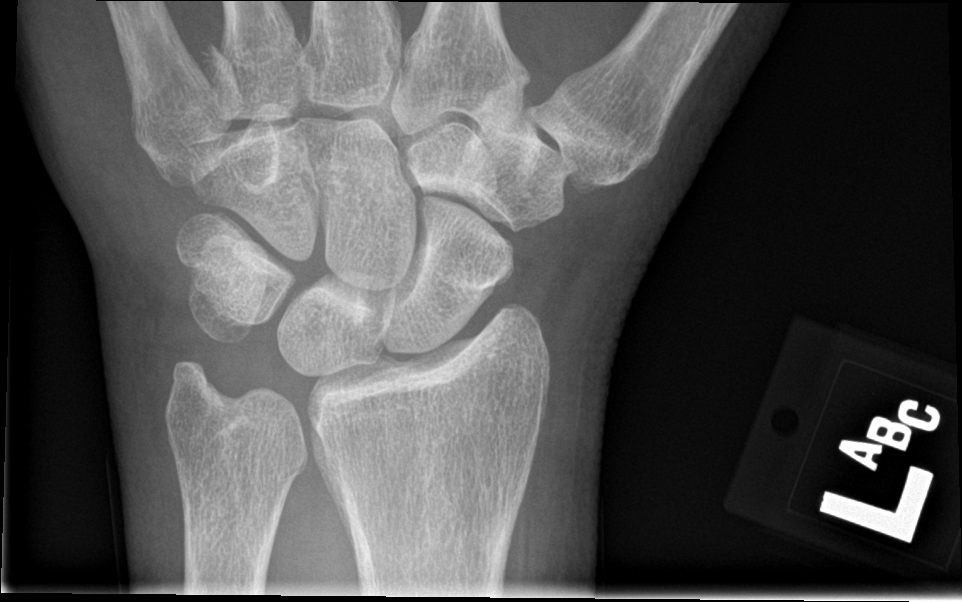

[4 of 4 positions shown; findings below may reference images not displayed]

FINDINGS: Right hand: No fracture or dislocation in the hand. No focal bony
lesion. Minimal degenerative change. Soft tissues are unremarkable.

Right wrist: There is a small displaced bony fragment at the base of
the fourth metatarsal, lateral aspect. There is possible
intra-articular involvement. No evidence of dislocation. No
additional fractures are identified in the wrist.

Right forearm: No evidence of fracture or dislocation in the tibia
or fibula. Regional soft tissues are unremarkable.
IMPRESSION: 1. Small displaced fracture fragment at the base of the fourth
metatarsal of the left hand, lateral aspect. Possible
intra-articular involvement.
2. No additional acute osseous abnormality in the hand, wrist or
forearm.

## 2020-05-27 ENCOUNTER — Emergency Department
Admission: EM | Admit: 2020-05-27 | Discharge: 2020-05-27 | Disposition: A | Discharge status: Home / Self Care | Payer: Medicaid Other | Attending: Emergency Medicine | Admitting: Emergency Medicine

## 2020-05-27 ENCOUNTER — Encounter: Payer: Self-pay | Admitting: Emergency Medicine

## 2020-05-27 ENCOUNTER — Other Ambulatory Visit: Payer: Self-pay

## 2020-05-27 DIAGNOSIS — Z79899 Other long term (current) drug therapy: Secondary | ICD-10-CM | POA: Insufficient documentation

## 2020-05-27 DIAGNOSIS — G252 Other specified forms of tremor: Secondary | ICD-10-CM

## 2020-05-27 DIAGNOSIS — J45909 Unspecified asthma, uncomplicated: Secondary | ICD-10-CM | POA: Insufficient documentation

## 2020-05-27 DIAGNOSIS — I10 Essential (primary) hypertension: Secondary | ICD-10-CM | POA: Insufficient documentation

## 2020-05-27 DIAGNOSIS — Y999 Unspecified external cause status: Secondary | ICD-10-CM | POA: Insufficient documentation

## 2020-05-27 DIAGNOSIS — Y9389 Activity, other specified: Secondary | ICD-10-CM | POA: Insufficient documentation

## 2020-05-27 DIAGNOSIS — Y929 Unspecified place or not applicable: Secondary | ICD-10-CM | POA: Insufficient documentation

## 2020-05-27 DIAGNOSIS — Z87891 Personal history of nicotine dependence: Secondary | ICD-10-CM | POA: Insufficient documentation

## 2020-05-27 DIAGNOSIS — R251 Tremor, unspecified: Secondary | ICD-10-CM | POA: Insufficient documentation

## 2020-05-27 DIAGNOSIS — S51812A Laceration without foreign body of left forearm, initial encounter: Secondary | ICD-10-CM | POA: Insufficient documentation

## 2020-05-27 DIAGNOSIS — Z7951 Long term (current) use of inhaled steroids: Secondary | ICD-10-CM | POA: Insufficient documentation

## 2020-05-27 DIAGNOSIS — W260XXA Contact with knife, initial encounter: Secondary | ICD-10-CM | POA: Insufficient documentation

## 2020-05-27 MED ORDER — PEG 3350-KCL-NA BICARB-NACL 420 G PO SOLR
ORAL | 0 refills | Status: DC
Start: 2020-05-27 — End: 2020-08-14

## 2020-05-27 MED ORDER — LIDOCAINE HCL (PF) 1 % IJ SOLN
5.0000 mL | Freq: Once | INTRAMUSCULAR | Status: AC
  Administered 2020-05-27: 5 mL
  Filled 2020-05-27: qty 5

## 2020-05-27 MED ORDER — CHLORDIAZEPOXIDE HCL 25 MG PO CAPS: 25.0000 mg | ORAL_CAPSULE | Freq: Two times a day (BID) | ORAL | 0 refills | Status: DC

## 2020-05-27 MED ORDER — CHLORDIAZEPOXIDE HCL 25 MG PO CAPS
25.0000 mg | ORAL_CAPSULE | Freq: Once | ORAL | Status: AC
  Administered 2020-05-27: 25 mg via ORAL
  Filled 2020-05-27: qty 1

## 2020-05-27 NOTE — ED Notes (Signed)
Pt reports multiple lacerations to left forearm when breaking down some boxes with a box cutter. Pt reports first laceration was accidental from the cardboard, but will not state if the following lacerations were self inflicted or accidental   Pt presents as anxious about a disagreement with his wife prior to arrival.

## 2020-05-27 NOTE — ED Provider Notes (Signed)
Miguel [name]   CSN: [id number] Arrival date & time: 05/27/20  1832     History Chief Complaint  Patient presents with  . Laceration    Miguel [name] is a 59 y.o. male presents to the emergency department for evaluation of laceration to the left dorsal forearm.  Patient states he fell with a box cutting knife and laceration to the dorsal aspect of his right wrist.  No other injury to his body.  Patient states this was an accident.  He has no suicidal homicidal ideations.  He denies any self inflicting lacerations.  He states that he was in an argument with his wife, he has since spoke to his wife and she has calm down.  Patient states he is not in any distress, again denies any suicidal homicidal ideations.  He has a history of alcoholism, last drink was 2 days ago, he has cut back significantly.  Was seen several days ago and prescribed Librium, this has helped tremendously, he states he has 1 pill left and would like a refill today.  HPI     Past Medical History:  Diagnosis Date  . Alcohol abuse   . Anxiety   . Asthma   . GERD (gastroesophageal reflux disease)   . Gout   . Hypertension   . Kidney stone   . OCD (obsessive compulsive disorder)   . Renal colic     Patient Active Problem List   Diagnosis Date Noted  . Abdominal pain 04/12/2020  . Hypertensive urgency 04/12/2020  . Alcohol withdrawal ([place]) 03/04/2020  . Right-sided chest wall pain 02/28/2020  . Alcohol abuse with alcohol-induced mood disorder ([place]) 12/02/2019  . Moderate episode of recurrent major depressive disorder ([place])   . Health care maintenance 10/26/2019  . Right knee pain 10/26/2019  . Generalized anxiety disorder 10/14/2019  . MDD (major depressive disorder), recurrent severe, without psychosis ([place]) 07/27/2019  . Social anxiety disorder 07/27/2019  . Left-sided chest wall pain 06/14/2019  . Alcoholic cirrhosis of liver without ascites  ([place]) 06/14/2019  . Alcohol abuse 06/14/2019  . AKI (acute kidney injury) ([place]) 05/27/2019  . Alcohol withdrawal syndrome without complication ([place]) 67/61/9509  . Alcoholic intoxication without complication ([place])   . Sepsis ([place]) 03/08/2019  . Breast lump or mass 10/04/2018  . Sepsis secondary to UTI ([place]) 09/14/2018  . Asthma 07/07/2018  . GERD (gastroesophageal reflux disease) 07/07/2018  . OCD (obsessive compulsive disorder) 07/07/2018  . Self-inflicted laceration of left wrist ([place]) 03/10/2018  . Leg hematoma 12/25/2017  . Suicide and self-inflicted injury by cutting and piercing instrument ([place]) 03/10/2017  . Severe recurrent major 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 ([place]) 03/09/2017  . Substance induced mood disorder ([place]) 08/15/2016  . Involuntary commitment 08/15/2016  . Alcohol use disorder, severe, dependence ([place]) 02/05/2016  . Hypertension 12/05/2015  . Tachycardia 12/05/2015  . Gout 11/13/2015  . Chronic back pain 05/02/2015    Past Surgical History:  Procedure Laterality Date  . CHOLECYSTECTOMY  2012  . EXTRACORPOREAL SHOCK WAVE LITHOTRIPSY Left 01/12/2019   Procedure: EXTRACORPOREAL SHOCK WAVE LITHOTRIPSY (ESWL);  Surgeon: Billey Co, MD;  Location: [facility] ORS;  Service: Urology;  Laterality: Left;       Family History  Problem Relation Age of Onset  . Alcohol abuse Father   . Breast cancer Mother 42    Social History   Tobacco Use  . Smoking status: Former Smoker    Types: Cigarettes  . Smokeless tobacco:  Never Used  . Tobacco comment: quit 30 years ago  Vaping Use  . Vaping Use: Never used  Substance Use Topics  . Alcohol use: Not Currently    Alcohol/week: 12.0 standard drinks    Types: 12 Shots of liquor per week    Comment: pint daily  . Drug use: No    Home Medications Prior to Admission medications   Medication Sig Start Date End Date Taking? Authorizing Provider  albuterol (VENTOLIN HFA) 108 (90 Base) MCG/ACT inhaler Inhale 2  puffs into the lungs every 6 (six) hours as needed. 03/21/20   Clapacs, Madie Reno, MD  allopurinol (ZYLOPRIM) 300 MG tablet Take 1 tablet (300 mg total) by mouth daily. 04/23/20   Iloabachie, Chioma E, NP  ascorbic acid (VITAMIN C) 500 MG tablet Take by mouth. 08/01/19   [provider]  chlordiazePOXIDE (LIBRIUM) 25 MG capsule Take 1 capsule (25 mg total) by mouth 2 (two) times daily. 05/27/20   Duanne Guess, PA-C  citalopram (CELEXA) 20 MG tablet Take 1 tablet (20 mg total) by mouth daily. 03/21/20 03/21/21  Clapacs, Madie Reno, MD  cloNIDine (CATAPRES) 0.1 MG tablet Take 0.1 mg by mouth daily.    [provider]  colchicine 0.6 MG tablet Take by mouth. 08/01/19   [provider]  Fluticasone-Salmeterol (ADVAIR DISKUS) 100-50 MCG/DOSE AEPB Inhale 1 puff into the lungs 2 (two) times daily. 03/21/20   Clapacs, Madie Reno, MD  folic acid (FOLVITE) 1 MG tablet Take 1 mg by mouth daily.  08/01/19   [provider]  lisinopril (ZESTRIL) 40 MG tablet Take 1 tablet (40 mg total) by mouth daily. 04/23/20   Iloabachie, Chioma E, NP  omeprazole (PRILOSEC) 10 MG capsule Take 1 capsule (10 mg total) by mouth daily. 04/21/20   Cuthriell, Charline Bills, PA-C  ondansetron (ZOFRAN-ODT) 4 MG disintegrating tablet Take 1 tablet (4 mg total) by mouth every 8 (eight) hours as needed for nausea or vomiting. 04/21/20   Cuthriell, Charline Bills, PA-C  pantoprazole (PROTONIX) 40 MG tablet Take 1 tablet (40 mg total) by mouth daily. 03/21/20   Clapacs, Madie Reno, MD  polyethylene glycol-electrolytes (NULYTELY) 420 g solution Drink one 8 oz glass every 20 mins until the entire container is finished starting at 5:00pm, today 05/27/20   Lucilla Lame, MD  thiamine 100 MG tablet Take 1 tablet (100 mg total) by mouth daily. 03/22/20   Clapacs, Madie Reno, MD    Allergies    Percocet [oxycodone-acetaminophen]  Review of Systems   Review of Systems  Constitutional: Negative for fever.  Respiratory: Negative for shortness of breath.     Cardiovascular: Negative for chest pain.  Gastrointestinal: Negative for nausea and vomiting.  Skin: Positive for wound.  Neurological: Positive for tremors. Negative for dizziness and headaches.  Psychiatric/Behavioral: Positive for agitation. Negative for confusion.    Physical Exam Updated Vital Signs BP 114/76 (BP Location: Right Arm)   Pulse 99   Temp 98.3 F (36.8 C) (Oral)   Resp 16   Ht 6' (1.829 m)   Wt 83.9 kg   SpO2 97%   BMI 25.09 kg/m   Physical Exam Constitutional:      Appearance: He is well-developed.  HENT:     Head: Normocephalic and atraumatic.  Eyes:     Conjunctiva/sclera: Conjunctivae normal.  Cardiovascular:     Rate and Rhythm: Normal rate.  Pulmonary:     Effort: Pulmonary effort is normal. No respiratory distress.  Musculoskeletal:  General: Normal range of motion.     Cervical back: Normal range of motion.     Comments: 1.5 cm laceration along the dorsal aspect left forearm with mild superficial abrasion.  No tendon deficits.  No active bleeding.  Is able make a full fist.  Full active extension of the digits.  Compartments are soft.  Sensation is intact distally.  Skin:    General: Skin is warm.     Findings: No rash.  Neurological:     General: No focal deficit present.     Mental Status: He is alert and oriented to person, place, and time. Mental status is at baseline.     Comments: Subtle tremor to both hands.  Psychiatric:        Mood and Affect: Mood normal.        Behavior: Behavior normal.        Thought Content: Thought content normal.        Judgment: Judgment normal.     Comments: Patient does not appear anxious     ED Results / Procedures / Treatments   Labs (all labs ordered are listed, but only abnormal results are displayed) Labs Reviewed - No data to display  EKG None  Radiology No results found.  Procedures .Marland KitchenLaceration Repair  Date/Time: 05/27/2020 10:14 PM Performed by: Duanne Guess,  PA-C Authorized by: Duanne Guess, PA-C   Consent:    Consent obtained:  Verbal   Consent given by:  Patient   Risks discussed:  Need for additional repair   Alternatives discussed:  No treatment Anesthesia (see MAR for exact dosages):    Anesthesia method:  Local infiltration   Local anesthetic:  Lidocaine 1% w/o epi Laceration details:    Location:  Shoulder/arm   Shoulder/arm location:  L lower arm   Length (cm):  1.5   Depth (mm):  3 Repair type:    Repair type:  Simple Pre-procedure details:    Preparation:  Patient was prepped and draped in usual sterile fashion Exploration:    Wound exploration: wound explored through full range of motion and entire depth of wound probed and visualized     Wound extent: no foreign bodies/material noted, no muscle damage noted, no tendon damage noted and no vascular damage noted     Contaminated: no   Treatment:    Area cleansed with:  Betadine and saline   Amount of cleaning:  Standard   Irrigation method:  Pressure wash Skin repair:    Repair method:  Sutures   Suture size:  5-0   Suture material:  Nylon   Suture technique:  Simple interrupted   Number of sutures:  3 Approximation:    Approximation:  Close Post-procedure details:    Dressing:  Bulky dressing, non-adherent dressing and tube gauze   Patient tolerance of procedure:  Tolerated well, no immediate complications   (including critical care time)  Medications Ordered in ED Medications  lidocaine (PF) (XYLOCAINE) 1 % injection 5 mL (5 mLs Infiltration Given 05/27/20 2132)  chlordiazePOXIDE (LIBRIUM) capsule 25 mg (25 mg Oral Given 05/27/20 2131)    ED Course  I have reviewed the triage vital signs and the nursing notes.  Pertinent labs & imaging results that were available during my care of the patient were reviewed by me and considered in my medical decision making (see chart for details).    MDM Rules/Calculators/A&P  59 year old male  with laceration to the dorsal aspect of the left forearm.  Patient tripped and cut himself with a box cutter.  He denies any intentional harm.  No suicidal or homicidal ideations.  He denies any other injury to his body.  Tetanus is up-to-date.  Laceration thoroughly irrigated and repaired with three 5-0 nylon sutures.  No neurovascular or tendon deficits noted.  Compartments are soft.  Patient complains of a mild tremor, some feelings of anxiousness.  No sign of any significant alcohol withdrawals.  He appears well.  Recently prescribed Librium which has helped tremendously.  Will give patient a refill of Librium.  He understands signs and symptoms return to the ER for. Final Clinical Impression(s) / ED Diagnoses Final diagnoses:  Forearm laceration, left, initial encounter  Coarse tremors    Rx / DC Orders ED Discharge Orders         Ordered    chlordiazePOXIDE (LIBRIUM) 25 MG capsule  2 times daily     Discontinue  Reprint     05/27/20 2209           Duanne Guess, PA-C 05/27/20 2218    Harvest Dark, MD 05/27/20 2246

## 2020-05-27 NOTE — Discharge Instructions (Addendum)
Keep laceration site clean and dry.  Follow-up with primary care provider in 7 to 10 days for suture removal.  Return to the ER for any increasing pain swelling warmth redness.  Take Librium as prescribed for anxiety and tremor

## 2020-05-27 NOTE — ED Notes (Signed)
Pt states tdap within 2 years

## 2020-05-27 NOTE — ED Triage Notes (Signed)
Patient arrives c/o fall, with left forearm laceration.  Bleeding controlled.  DSD applied.  Patient scheduled for colonoscopy tomorrow and is currently taking prep.  AAOx3.  Skin warm and dry. NAD

## 2020-05-27 NOTE — ED Notes (Signed)
Pt denies SI/HI 

## 2020-05-28 ENCOUNTER — Ambulatory Visit
Admission: RE | Admit: 2020-05-28 | Discharge: 2020-05-28 | Disposition: A | Discharge status: Home / Self Care | Payer: Self-pay | Attending: Gastroenterology | Admitting: Gastroenterology

## 2020-05-28 ENCOUNTER — Ambulatory Visit: Payer: Self-pay | Admitting: Anesthesiology

## 2020-05-28 ENCOUNTER — Other Ambulatory Visit: Payer: Self-pay

## 2020-05-28 ENCOUNTER — Encounter: Payer: Self-pay | Admitting: Gastroenterology

## 2020-05-28 ENCOUNTER — Encounter
Admission: RE | Disposition: A | Discharge status: Home / Self Care | Payer: Self-pay | Source: Home / Self Care | Attending: Gastroenterology

## 2020-05-28 DIAGNOSIS — D124 Benign neoplasm of descending colon: Secondary | ICD-10-CM | POA: Insufficient documentation

## 2020-05-28 DIAGNOSIS — Z7951 Long term (current) use of inhaled steroids: Secondary | ICD-10-CM | POA: Insufficient documentation

## 2020-05-28 DIAGNOSIS — Z87442 Personal history of urinary calculi: Secondary | ICD-10-CM | POA: Insufficient documentation

## 2020-05-28 DIAGNOSIS — F429 Obsessive-compulsive disorder, unspecified: Secondary | ICD-10-CM | POA: Insufficient documentation

## 2020-05-28 DIAGNOSIS — M109 Gout, unspecified: Secondary | ICD-10-CM | POA: Insufficient documentation

## 2020-05-28 DIAGNOSIS — Z1211 Encounter for screening for malignant neoplasm of colon: Secondary | ICD-10-CM | POA: Insufficient documentation

## 2020-05-28 DIAGNOSIS — D126 Benign neoplasm of colon, unspecified: Secondary | ICD-10-CM

## 2020-05-28 DIAGNOSIS — K219 Gastro-esophageal reflux disease without esophagitis: Secondary | ICD-10-CM | POA: Insufficient documentation

## 2020-05-28 DIAGNOSIS — Z86010 Personal history of colonic polyps: Secondary | ICD-10-CM | POA: Insufficient documentation

## 2020-05-28 DIAGNOSIS — F419 Anxiety disorder, unspecified: Secondary | ICD-10-CM | POA: Insufficient documentation

## 2020-05-28 DIAGNOSIS — Z79899 Other long term (current) drug therapy: Secondary | ICD-10-CM | POA: Insufficient documentation

## 2020-05-28 DIAGNOSIS — J45909 Unspecified asthma, uncomplicated: Secondary | ICD-10-CM | POA: Insufficient documentation

## 2020-05-28 DIAGNOSIS — Z87891 Personal history of nicotine dependence: Secondary | ICD-10-CM | POA: Insufficient documentation

## 2020-05-28 DIAGNOSIS — D122 Benign neoplasm of ascending colon: Secondary | ICD-10-CM | POA: Insufficient documentation

## 2020-05-28 DIAGNOSIS — I10 Essential (primary) hypertension: Secondary | ICD-10-CM | POA: Insufficient documentation

## 2020-05-28 DIAGNOSIS — K5730 Diverticulosis of large intestine without perforation or abscess without bleeding: Secondary | ICD-10-CM | POA: Insufficient documentation

## 2020-05-28 HISTORY — PX: COLONOSCOPY WITH PROPOFOL: SHX5780

## 2020-05-28 SURGERY — COLONOSCOPY WITH PROPOFOL
Anesthesia: General

## 2020-05-28 MED ORDER — GLYCOPYRROLATE 0.2 MG/ML IJ SOLN
INTRAMUSCULAR | Status: AC
  Filled 2020-05-28: qty 1

## 2020-05-28 MED ORDER — SODIUM CHLORIDE 0.9 % IV SOLN: INTRAVENOUS | Status: DC

## 2020-05-28 MED ORDER — PROPOFOL 10 MG/ML IV BOLUS
INTRAVENOUS | Status: DC | PRN
  Administered 2020-05-28 (×2): 50 mg via INTRAVENOUS

## 2020-05-28 MED ORDER — PROPOFOL 500 MG/50ML IV EMUL
INTRAVENOUS | Status: DC | PRN
  Administered 2020-05-28: 155 ug/kg/min via INTRAVENOUS

## 2020-05-28 MED ORDER — LIDOCAINE HCL (CARDIAC) PF 100 MG/5ML IV SOSY
PREFILLED_SYRINGE | INTRAVENOUS | Status: DC | PRN
  Administered 2020-05-28: 100 mg via INTRAVENOUS

## 2020-05-28 MED ORDER — MIDAZOLAM HCL 2 MG/2ML IJ SOLN
INTRAMUSCULAR | Status: DC | PRN
  Administered 2020-05-28: 2 mg via INTRAVENOUS

## 2020-05-28 MED ORDER — PHENYLEPHRINE HCL (PRESSORS) 10 MG/ML IV SOLN
INTRAVENOUS | Status: AC
  Filled 2020-05-28: qty 1

## 2020-05-28 MED ORDER — MIDAZOLAM HCL 2 MG/2ML IJ SOLN
INTRAMUSCULAR | Status: AC
  Filled 2020-05-28: qty 2

## 2020-05-28 MED ORDER — PROPOFOL 10 MG/ML IV BOLUS
INTRAVENOUS | Status: AC
  Filled 2020-05-28: qty 20

## 2020-05-28 MED ORDER — LIDOCAINE HCL (PF) 2 % IJ SOLN
INTRAMUSCULAR | Status: AC
  Filled 2020-05-28: qty 25

## 2020-05-28 NOTE — H&P (Signed)
[name], MD [address], [place], [place], [place], [address] [address], [place], [place], [place], [address] Phone: [phone]  Fax: [phone]  Primary Care Physician:  Langston Reusing, NP   Pre-Procedure History & Physical: HPI:  Miguel [name] is a 59 y.o. male is here for an colonoscopy.   Past Medical History:  Diagnosis Date  . Alcohol abuse   . Anxiety   . Asthma   . GERD (gastroesophageal reflux disease)   . Gout   . Hypertension   . Kidney stone   . OCD (obsessive compulsive disorder)   . Renal colic     Past Surgical History:  Procedure Laterality Date  . CHOLECYSTECTOMY  2012  . COLONOSCOPY    . EXTRACORPOREAL SHOCK WAVE LITHOTRIPSY Left 01/12/2019   Procedure: EXTRACORPOREAL SHOCK WAVE LITHOTRIPSY (ESWL);  Surgeon: Billey Co, MD;  Location: [facility] ORS;  Service: Urology;  Laterality: Left;  . UPPER GI ENDOSCOPY      Prior to Admission medications   Medication Sig Start Date End Date Taking? Authorizing Provider  albuterol (VENTOLIN HFA) 108 (90 Base) MCG/ACT inhaler Inhale 2 puffs into the lungs every 6 (six) hours as needed. 03/21/20  Yes Clapacs, Madie Reno, MD  allopurinol (ZYLOPRIM) 300 MG tablet Take 1 tablet (300 mg total) by mouth daily. 04/23/20  Yes Iloabachie, Chioma E, NP  ascorbic acid (VITAMIN C) 500 MG tablet Take by mouth. 08/01/19  Yes [provider]  chlordiazePOXIDE (LIBRIUM) 25 MG capsule Take 1 capsule (25 mg total) by mouth 2 (two) times daily. 05/27/20  Yes Duanne Guess, PA-C  citalopram (CELEXA) 20 MG tablet Take 1 tablet (20 mg total) by mouth daily. 03/21/20 03/21/21 Yes Clapacs, Madie Reno, MD  colchicine 0.6 MG tablet Take by mouth. 08/01/19  Yes [provider]  Fluticasone-Salmeterol (ADVAIR DISKUS) 100-50 MCG/DOSE AEPB Inhale 1 puff into the lungs 2 (two) times daily. 03/21/20  Yes Clapacs, Madie Reno, MD  folic acid (FOLVITE) 1 MG tablet Take 1 mg by mouth daily.  08/01/19  Yes [provider]    lisinopril (ZESTRIL) 40 MG tablet Take 1 tablet (40 mg total) by mouth daily. 04/23/20  Yes Iloabachie, Chioma E, NP  omeprazole (PRILOSEC) 10 MG capsule Take 1 capsule (10 mg total) by mouth daily. 04/21/20  Yes Cuthriell, Charline Bills, PA-C  thiamine 100 MG tablet Take 1 tablet (100 mg total) by mouth daily. 03/22/20  Yes Clapacs, Madie Reno, MD  cloNIDine (CATAPRES) 0.1 MG tablet Take 0.1 mg by mouth daily.    [provider]  ondansetron (ZOFRAN-ODT) 4 MG disintegrating tablet Take 1 tablet (4 mg total) by mouth every 8 (eight) hours as needed for nausea or vomiting. 04/21/20   Cuthriell, Charline Bills, PA-C  pantoprazole (PROTONIX) 40 MG tablet Take 1 tablet (40 mg total) by mouth daily. Patient not taking: Reported on 05/28/2020 03/21/20   Clapacs, Madie Reno, MD  polyethylene glycol-electrolytes (NULYTELY) 420 g solution Drink one 8 oz glass every 20 mins until the entire container is finished starting at 5:00pm, today 05/27/20   Lucilla Lame, MD    Allergies as of 05/03/2020 - Review Complete 04/13/2020  Allergen Reaction Noted  . Percocet [oxycodone-acetaminophen] Nausea And Vomiting 01/12/2019    Family History  Problem Relation Age of Onset  . Alcohol abuse Father   . Breast cancer Mother 50    Social History   Socioeconomic History  . Marital status: Married    Spouse name: Not on  file  . Number of children: Not on file  . Years of education: Not on file  . Highest education level: Not on file  Occupational History  . Not on file  Tobacco Use  . Smoking status: Former Smoker    Types: Cigarettes  . Smokeless tobacco: Never Used  . Tobacco comment: quit 30 years ago  Vaping Use  . Vaping Use: Never used  Substance and Sexual Activity  . Alcohol use: Not Currently    Alcohol/week: 12.0 standard drinks    Types: 12 Shots of liquor per week    Comment: pint daily  . Drug use: No  . Sexual activity: Not on file  Other Topics Concern  . Not on file  Social History Narrative    Lives at home with his wife and takes care of his wife. Independent at baseline      - Biggest strain is financial but doesn't think they could got get more help.      Patient expressed interest in possible financial assistance. Informed written consent obtained   Social Determinants of Health   Financial Resource Strain:   . Difficulty of Paying Living Expenses:   Food Insecurity:   . Worried About Charity fundraiser in the Last Year:   . Arboriculturist in the Last Year:   Transportation Needs:   . Film/video editor (Medical):   Marland Kitchen Lack of Transportation (Non-Medical):   Physical Activity:   . Days of Exercise per Week:   . Minutes of Exercise per Session:   Stress:   . Feeling of Stress :   Social Connections:   . Frequency of Communication with Friends and Family:   . Frequency of Social Gatherings with Friends and Family:   . Attends Religious Services:   . Active Member of Clubs or Organizations:   . Attends Archivist Meetings:   Marland Kitchen Marital Status:   Intimate Partner Violence:   . Fear of Current or Ex-Partner:   . Emotionally Abused:   Marland Kitchen Physically Abused:   . Sexually Abused:     Review of Systems: See HPI, otherwise negative ROS  Physical Exam: BP (!) 144/88   Pulse 89   Temp (!) 97.4 F (36.3 C) (Temporal)   Resp 20   Ht 6' (1.829 m)   Wt 83.9 kg   SpO2 100%   BMI 25.09 kg/m  General:   Alert,  pleasant and cooperative in NAD Head:  Normocephalic and atraumatic. Neck:  Supple; no masses or thyromegaly. Lungs:  Clear throughout to auscultation, normal respiratory effort.    Heart:  +S1, +S2, Regular rate and rhythm, No edema. Abdomen:  Soft, nontender and nondistended. Normal bowel sounds, without guarding, and without rebound.   Neurologic:  Alert and  oriented x4;  grossly normal neurologically.  Impression/Plan: Miguel [name] is here for an colonoscopy to be performed for surveillance due to prior history of colon polyps   Risks,  benefits, limitations, and alternatives regarding  colonoscopy have been reviewed with the patient.  Questions have been answered.  All parties agreeable.   [name], MD  05/28/2020, 7:40 AM

## 2020-05-28 NOTE — Anesthesia Procedure Notes (Signed)
Procedure Name: General with mask airway Performed by: Fletcher-Harrison, Dezmon Conover, CRNA Pre-anesthesia Checklist: Patient identified, Emergency Drugs available, Suction available and Patient being monitored Patient Re-evaluated:Patient Re-evaluated prior to induction Oxygen Delivery Method: Simple face mask Induction Type: IV induction Placement Confirmation: positive ETCO2 and CO2 detector Dental Injury: Teeth and Oropharynx as per pre-operative assessment        

## 2020-05-28 NOTE — Anesthesia Postprocedure Evaluation (Signed)
Anesthesia Post Note  Patient: Miguel [name]  Procedure(s) Performed: COLONOSCOPY WITH PROPOFOL (N/A )  Patient location during evaluation: Endoscopy Anesthesia Type: General Level of consciousness: awake and alert Pain management: pain level controlled Vital Signs Assessment: post-procedure vital signs reviewed and stable Respiratory status: spontaneous breathing, nonlabored ventilation, respiratory function stable and patient connected to nasal cannula oxygen Cardiovascular status: blood pressure returned to baseline and stable Postop Assessment: no apparent nausea or vomiting Anesthetic complications: no   No complications documented.   Last Vitals:  Vitals:   05/28/20 0809 05/28/20 0838  BP: 121/65 129/88  Pulse:    Resp:    Temp: (!) 36.2 C   SpO2:      Last Pain:  Vitals:   05/28/20 0838  TempSrc:   PainSc: 0-No pain                 Arita Miss

## 2020-05-28 NOTE — Transfer of Care (Signed)
Immediate Anesthesia Transfer of Care Note  Patient: Miguel [name]  Procedure(s) Performed: COLONOSCOPY WITH PROPOFOL (N/A )  Patient Location: Endoscopy Unit  Anesthesia Type:General  Level of Consciousness: awake, drowsy and patient cooperative  Airway & Oxygen Therapy: Patient Spontanous Breathing and Patient connected to face mask oxygen  Post-op Assessment: Report given to RN and Post -op Vital signs reviewed and stable  Post vital signs: Reviewed and stable  Last Vitals:  Vitals Value Taken Time  BP 121/65 05/28/20 0809  Temp 36.2 C 05/28/20 0809  Pulse 89 05/28/20 0810  Resp 19 05/28/20 0810  SpO2 100 % 05/28/20 0810  Vitals shown include unvalidated device data.  Last Pain:  Vitals:   05/28/20 0809  TempSrc: Temporal  PainSc: 0-No pain         Complications: No complications documented.

## 2020-05-28 NOTE — Op Note (Signed)
[facility] Gastroenterology Patient Name: Miguel [name] Procedure Date: 05/28/2020 7:27 AM MRN: [id number] Account #: [id number] Date of Birth: 06/08/1961 Admit Type: Ambulatory Age: 59 Room: [facility] ENDO ROOM 4 Gender: Male Note Status: Finalized Procedure:             Colonoscopy Indications:           High risk colon cancer surveillance: Personal history                         of multiple (3 or more) adenomas, Last colonoscopy:                         January 2015 Providers:             [name] MD, MD Medicines:             Monitored Anesthesia Care Complications:         No immediate complications. Procedure:             Pre-Anesthesia Assessment:                        - Prior to the procedure, a History and Physical was                         performed, and patient medications, allergies and                         sensitivities were reviewed. The patient's tolerance                         of previous anesthesia was reviewed.                        - The risks and benefits of the procedure and the                         sedation options and risks were discussed with the                         patient. All questions were answered and informed                         consent was obtained.                        - ASA Grade Assessment: II - A patient with mild                         systemic disease.                        After obtaining informed consent, the colonoscope was                         passed under direct vision. Throughout the procedure,                         the patient's blood pressure, pulse, and oxygen  saturations were monitored continuously. The                         Colonoscope was introduced through the anus and                         advanced to the the cecum, identified by the                         appendiceal orifice. The colonoscopy was performed                         with ease. The patient  tolerated the procedure well.                         The quality of the bowel preparation was good. Findings:      The perianal and digital rectal examinations were normal.      Multiple small-mouthed diverticula were found in the sigmoid colon.      Four sessile polyps were found in the ascending colon. The polyps were 4       to 8 mm in size. These polyps were removed with a cold snare. Resection       and retrieval were complete.      Five sessile polyps were found in the descending colon. The polyps were       4 to 7 mm in size. These polyps were removed with a cold snare.       Resection and retrieval were complete.      The exam was otherwise without abnormality on direct and retroflexion       views. Impression:            - Diverticulosis in the sigmoid colon.                        - Four 4 to 8 mm polyps in the ascending colon,                         removed with a cold snare. Resected and retrieved.                        - Five 4 to 7 mm polyps in the descending colon,                         removed with a cold snare. Resected and retrieved.                        - The examination was otherwise normal on direct and                         retroflexion views. Recommendation:        - Discharge patient to home (with escort).                        - Resume previous diet.                        - Continue present medications.                        -  Await pathology results.                        - Repeat colonoscopy in 3 - 5 years for surveillance                         based on pathology results. Procedure Code(s):     --- Professional ---                        [phone], Colonoscopy, flexible; with removal of                         tumor(s), polyp(s), or other lesion(s) by snare                         technique Diagnosis Code(s):     --- Professional ---                        Z86.010, Personal history of colonic polyps                        K63.5, Polyp of colon                         K57.30, Diverticulosis of large intestine without                         perforation or abscess without bleeding CPT copyright 2019 American Medical Association. All rights reserved. The codes documented in this report are preliminary and upon coder review may  be revised to meet current compliance requirements. [name], MD [name] MD, MD 05/28/2020 8:06:31 AM This report has been signed electronically. Number of Addenda: 0 Note Initiated On: 05/28/2020 7:27 AM Scope Withdrawal Time: 0 hours 16 minutes 13 seconds  Total Procedure Duration: 0 hours 19 minutes 10 seconds  Estimated Blood Loss:  Estimated blood loss: none.      [facility]

## 2020-05-28 NOTE — Anesthesia Preprocedure Evaluation (Signed)
Anesthesia Evaluation  Patient identified by MRN, date of birth, ID band Patient awake    Reviewed: Allergy & Precautions, NPO status , Patient's Chart, lab work & pertinent test results  History of Anesthesia Complications Negative for: history of anesthetic complications  Airway Mallampati: II  TM Distance: >3 FB Neck ROM: Full    Dental  (+) Missing, Poor Dentition,    Pulmonary asthma , neg sleep apnea, neg COPD, Patient abstained from smoking.Not current smoker, former smoker,  Uses advair PRN, does not need regular inhaler use.   Pulmonary exam normal breath sounds clear to auscultation       Cardiovascular Exercise Tolerance: Good METShypertension, (-) CAD and (-) Past MI negative cardio ROS  (-) dysrhythmias  Rhythm:Regular Rate:Normal - Systolic murmurs    Neuro/Psych PSYCHIATRIC DISORDERS Anxiety Depression negative neurological ROS     GI/Hepatic GERD  Medicated and Controlled,(+)     substance abuse  alcohol use,   Endo/Other  neg diabetes  Renal/GU negative Renal ROS     Musculoskeletal   Abdominal   Peds  Hematology   Anesthesia Other Findings Past Medical History: No date: Alcohol abuse No date: Anxiety No date: Asthma No date: GERD (gastroesophageal reflux disease) No date: Gout No date: Hypertension No date: Kidney stone No date: OCD (obsessive compulsive disorder) No date: Renal colic  Reproductive/Obstetrics                             Anesthesia Physical Anesthesia Plan  ASA: II  Anesthesia Plan: General   Post-op Pain Management:    Induction: Intravenous  PONV Risk Score and Plan: 2 and Ondansetron, Propofol infusion and TIVA  Airway Management Planned: Nasal Cannula and Natural Airway  Additional Equipment: None  Intra-op Plan:   Post-operative Plan:   Informed Consent: I have reviewed the patients History and Physical, chart, labs and  discussed the procedure including the risks, benefits and alternatives for the proposed anesthesia with the patient or authorized representative who has indicated his/her understanding and acceptance.     Dental advisory given  Plan Discussed with: CRNA and Surgeon  Anesthesia Plan Comments: (Discussed risks of anesthesia with patient, including possibility of difficulty with spontaneous ventilation under anesthesia necessitating airway intervention, PONV, and rare risks such as cardiac or respiratory or neurological events. Patient understands.)        Anesthesia Quick Evaluation

## 2020-05-29 ENCOUNTER — Encounter: Payer: Self-pay | Admitting: Gastroenterology

## 2020-05-29 LAB — SURGICAL PATHOLOGY

## 2020-06-03 ENCOUNTER — Emergency Department
Admission: EM | Admit: 2020-06-03 | Discharge: 2020-06-04 | Disposition: A | Discharge status: Home / Self Care | Payer: Self-pay | Attending: Emergency Medicine | Admitting: Emergency Medicine

## 2020-06-03 DIAGNOSIS — F1014 Alcohol abuse with alcohol-induced mood disorder: Secondary | ICD-10-CM

## 2020-06-03 DIAGNOSIS — Z87891 Personal history of nicotine dependence: Secondary | ICD-10-CM | POA: Insufficient documentation

## 2020-06-03 DIAGNOSIS — F1020 Alcohol dependence, uncomplicated: Secondary | ICD-10-CM | POA: Diagnosis present

## 2020-06-03 DIAGNOSIS — F10129 Alcohol abuse with intoxication, unspecified: Secondary | ICD-10-CM | POA: Insufficient documentation

## 2020-06-03 DIAGNOSIS — S51812A Laceration without foreign body of left forearm, initial encounter: Secondary | ICD-10-CM | POA: Insufficient documentation

## 2020-06-03 DIAGNOSIS — F330 Major depressive disorder, recurrent, mild: Secondary | ICD-10-CM | POA: Insufficient documentation

## 2020-06-03 DIAGNOSIS — F1010 Alcohol abuse, uncomplicated: Secondary | ICD-10-CM | POA: Insufficient documentation

## 2020-06-03 DIAGNOSIS — F10220 Alcohol dependence with intoxication, uncomplicated: Secondary | ICD-10-CM | POA: Diagnosis present

## 2020-06-03 DIAGNOSIS — Z20822 Contact with and (suspected) exposure to covid-19: Secondary | ICD-10-CM | POA: Insufficient documentation

## 2020-06-03 DIAGNOSIS — F32A Depression, unspecified: Secondary | ICD-10-CM

## 2020-06-03 DIAGNOSIS — I10 Essential (primary) hypertension: Secondary | ICD-10-CM | POA: Insufficient documentation

## 2020-06-03 DIAGNOSIS — Y929 Unspecified place or not applicable: Secondary | ICD-10-CM | POA: Insufficient documentation

## 2020-06-03 DIAGNOSIS — X58XXXA Exposure to other specified factors, initial encounter: Secondary | ICD-10-CM | POA: Insufficient documentation

## 2020-06-03 DIAGNOSIS — R45851 Suicidal ideations: Secondary | ICD-10-CM | POA: Insufficient documentation

## 2020-06-03 DIAGNOSIS — Z79899 Other long term (current) drug therapy: Secondary | ICD-10-CM | POA: Insufficient documentation

## 2020-06-03 DIAGNOSIS — J45909 Unspecified asthma, uncomplicated: Secondary | ICD-10-CM | POA: Insufficient documentation

## 2020-06-03 DIAGNOSIS — Y999 Unspecified external cause status: Secondary | ICD-10-CM | POA: Insufficient documentation

## 2020-06-03 DIAGNOSIS — Y939 Activity, unspecified: Secondary | ICD-10-CM | POA: Insufficient documentation

## 2020-06-03 LAB — SARS CORONAVIRUS 2 BY RT PCR (HOSPITAL ORDER, PERFORMED IN ~~LOC~~ HOSPITAL LAB): SARS Coronavirus 2: NEGATIVE

## 2020-06-03 MED ORDER — ADULT MULTIVITAMIN W/MINERALS CH
1.0000 | ORAL_TABLET | Freq: Every day | ORAL | Status: DC
  Administered 2020-06-03 – 2020-06-04 (×2): 1 via ORAL
  Filled 2020-06-03 (×2): qty 1

## 2020-06-03 MED ORDER — LOPERAMIDE HCL 2 MG PO CAPS
2.0000 mg | ORAL_CAPSULE | ORAL | Status: DC | PRN
  Filled 2020-06-03: qty 2

## 2020-06-03 MED ORDER — ONDANSETRON 4 MG PO TBDP
4.0000 mg | ORAL_TABLET | Freq: Four times a day (QID) | ORAL | Status: DC | PRN
  Filled 2020-06-03: qty 1

## 2020-06-03 MED ORDER — LORAZEPAM 1 MG PO TABS: 1.0000 mg | ORAL_TABLET | Freq: Every day | ORAL | Status: DC

## 2020-06-03 MED ORDER — LORAZEPAM 1 MG PO TABS: 1.0000 mg | ORAL_TABLET | Freq: Four times a day (QID) | ORAL | Status: DC | PRN

## 2020-06-03 MED ORDER — THIAMINE HCL 100 MG/ML IJ SOLN
100.0000 mg | Freq: Once | INTRAMUSCULAR | Status: AC
  Administered 2020-06-03: 100 mg via INTRAMUSCULAR
  Filled 2020-06-03: qty 2

## 2020-06-03 MED ORDER — HYDROXYZINE HCL 25 MG PO TABS: 25.0000 mg | ORAL_TABLET | Freq: Four times a day (QID) | ORAL | Status: DC | PRN

## 2020-06-03 MED ORDER — LORAZEPAM 1 MG PO TABS: 1.0000 mg | ORAL_TABLET | Freq: Two times a day (BID) | ORAL | Status: DC

## 2020-06-03 MED ORDER — LORAZEPAM 1 MG PO TABS
1.0000 mg | ORAL_TABLET | Freq: Four times a day (QID) | ORAL | Status: DC
  Administered 2020-06-03 – 2020-06-04 (×3): 1 mg via ORAL
  Filled 2020-06-03 (×3): qty 1

## 2020-06-03 MED ORDER — THIAMINE HCL 100 MG PO TABS
100.0000 mg | ORAL_TABLET | Freq: Every day | ORAL | Status: DC
  Administered 2020-06-04: 100 mg via ORAL
  Filled 2020-06-03: qty 1

## 2020-06-03 MED ORDER — LORAZEPAM 1 MG PO TABS: 1.0000 mg | ORAL_TABLET | Freq: Three times a day (TID) | ORAL | Status: DC

## 2020-06-03 NOTE — Consult Note (Signed)
[facility] Consult   Reason for Consult:  Suicidal ideations Referring Physician:  EDP Patient Identification: Miguel [name] MRN:  [id number] Principal Diagnosis: Alcohol abuse with alcohol-induced mood disorder ([place]) Diagnosis:  Principal Problem:   Alcohol abuse with alcohol-induced mood disorder ([place]) Active Problems:   Alcohol use disorder, severe, dependence ([place])   Total Time spent with patient: 45 minutes  Subjective:   Miguel [name] is a 59 y.o. male patient admitted with alcohol abuse and suicidal ideations.  HPI: Patient arrived to the emergency department after consuming alcohol.  Intoxicated and voicing thoughts to want to die.  High depression related to having to care for his wife.  Depression and alcohol consumption decreased with less stress.  Depression makes his alcohol use worse in his alcohol use makes his depression worse.  Currently has suicidal ideations under the influence of alcohol.  He is well-known to this emergency department for similar presentations and when his intoxication subsides so do his suicidal ideations.  Admit for overnight and reevaluate in the morning for stability.  Past Psychiatric History: Depression, alcohol use disorder  Risk to Self:  yes Risk to Others:  none Prior Inpatient Therapy:  multiple Prior Outpatient Therapy:  yes  Past Medical History:  Past Medical History:  Diagnosis Date  . Alcohol abuse   . Anxiety   . Asthma   . GERD (gastroesophageal reflux disease)   . Gout   . Hypertension   . Kidney stone   . OCD (obsessive compulsive disorder)   . Renal colic     Past Surgical History:  Procedure Laterality Date  . CHOLECYSTECTOMY  2012  . COLONOSCOPY    . COLONOSCOPY WITH PROPOFOL N/A 05/28/2020   Procedure: COLONOSCOPY WITH PROPOFOL;  Surgeon: [name], MD;  Location: [facility] ENDOSCOPY;  Service: Gastroenterology;  Laterality: N/A;  . EXTRACORPOREAL SHOCK WAVE LITHOTRIPSY Left 01/12/2019   Procedure:  EXTRACORPOREAL SHOCK WAVE LITHOTRIPSY (ESWL);  Surgeon: Billey Co, MD;  Location: [facility] ORS;  Service: Urology;  Laterality: Left;  . UPPER GI ENDOSCOPY     Family History:  Family History  Problem Relation Age of Onset  . Alcohol abuse Father   . Breast cancer Mother 54   Family Psychiatric  History: see above Social History:  Social History   Substance and Sexual Activity  Alcohol Use Not Currently  . Alcohol/week: 12.0 standard drinks  . Types: 12 Shots of liquor per week   Comment: pint daily     Social History   Substance and Sexual Activity  Drug Use No    Social History   Socioeconomic History  . Marital status: Married    Spouse name: Not on file  . Number of children: Not on file  . Years of education: Not on file  . Highest education level: Not on file  Occupational History  . Not on file  Tobacco Use  . Smoking status: Former Smoker    Types: Cigarettes  . Smokeless tobacco: Never Used  . Tobacco comment: quit 30 years ago  Vaping Use  . Vaping Use: Never used  Substance and Sexual Activity  . Alcohol use: Not Currently    Alcohol/week: 12.0 standard drinks    Types: 12 Shots of liquor per week    Comment: pint daily  . Drug use: No  . Sexual activity: Not on file  Other Topics Concern  . Not on file  Social History Narrative   Lives at home with his wife and takes  care of his wife. Independent at baseline      - Biggest strain is financial but doesn't think they could got get more help.      Patient expressed interest in possible financial assistance. Informed written consent obtained   Social Determinants of Health   Financial Resource Strain:   . Difficulty of Paying Living Expenses:   Food Insecurity:   . Worried About Charity fundraiser in the Last Year:   . Arboriculturist in the Last Year:   Transportation Needs:   . Film/video editor (Medical):   Marland Kitchen Lack of Transportation (Non-Medical):   Physical Activity:   . Days of  Exercise per Week:   . Minutes of Exercise per Session:   Stress:   . Feeling of Stress :   Social Connections:   . Frequency of Communication with Friends and Family:   . Frequency of Social Gatherings with Friends and Family:   . Attends Religious Services:   . Active Member of Clubs or Organizations:   . Attends Archivist Meetings:   Marland Kitchen Marital Status:    Additional Social History:    Allergies:   Allergies  Allergen Reactions  . Percocet [Oxycodone-Acetaminophen] Nausea And Vomiting    Took on an empty stomach, can tolerate on a full stomach.    Labs: No results found for this or any previous visit (from the past 48 hour(s)).  No current facility-administered medications for this encounter.   Current Outpatient Medications  Medication Sig Dispense Refill  . albuterol (VENTOLIN HFA) 108 (90 Base) MCG/ACT inhaler Inhale 2 puffs into the lungs every 6 (six) hours as needed. 6.7 g 1  . allopurinol (ZYLOPRIM) 300 MG tablet Take 1 tablet (300 mg total) by mouth daily. 30 tablet 2  . ascorbic acid (VITAMIN C) 500 MG tablet Take by mouth.    . chlordiazePOXIDE (LIBRIUM) 25 MG capsule Take 1 capsule (25 mg total) by mouth 2 (two) times daily. 10 capsule 0  . citalopram (CELEXA) 20 MG tablet Take 1 tablet (20 mg total) by mouth daily. 30 tablet 1  . cloNIDine (CATAPRES) 0.1 MG tablet Take 0.1 mg by mouth daily.    . colchicine 0.6 MG tablet Take by mouth.    . Fluticasone-Salmeterol (ADVAIR DISKUS) 100-50 MCG/DOSE AEPB Inhale 1 puff into the lungs 2 (two) times daily. 295 each 0  . folic acid (FOLVITE) 1 MG tablet Take 1 mg by mouth daily.     Marland Kitchen lisinopril (ZESTRIL) 40 MG tablet Take 1 tablet (40 mg total) by mouth daily. 30 tablet 2  . omeprazole (PRILOSEC) 10 MG capsule Take 1 capsule (10 mg total) by mouth daily. 30 capsule 4  . ondansetron (ZOFRAN-ODT) 4 MG disintegrating tablet Take 1 tablet (4 mg total) by mouth every 8 (eight) hours as needed for nausea or vomiting.  20 tablet 0  . pantoprazole (PROTONIX) 40 MG tablet Take 1 tablet (40 mg total) by mouth daily. (Patient not taking: Reported on 05/28/2020) 30 tablet 1  . polyethylene glycol-electrolytes (NULYTELY) 420 g solution Drink one 8 oz glass every 20 mins until the entire container is finished starting at 5:00pm, today 4000 mL 0  . thiamine 100 MG tablet Take 1 tablet (100 mg total) by mouth daily. 30 tablet 1    Musculoskeletal: Strength & Muscle Tone: decreased Gait & Station: unsteady Patient leans: N/A  Psychiatric Specialty Exam: Physical Exam Vitals and nursing note reviewed.  Constitutional:  Appearance: Normal appearance.  HENT:     Head: Normocephalic.  Pulmonary:     Effort: Pulmonary effort is normal.  Musculoskeletal:        General: Normal range of motion.     Cervical back: Normal range of motion.  Neurological:     General: No focal deficit present.     Mental Status: He is alert and oriented to person, place, and time.  Psychiatric:        Attention and Perception: He is inattentive.        Mood and Affect: Mood is anxious and depressed.        Speech: Speech normal.        Behavior: Behavior normal.        Thought Content: Thought content includes suicidal ideation. Thought content includes suicidal plan.        Cognition and Memory: Cognition is impaired.        Judgment: Judgment normal.     Review of Systems  Psychiatric/Behavioral: Positive for dysphoric mood and suicidal ideas. The patient is nervous/anxious.   All other systems reviewed and are negative.   Blood pressure 103/70, pulse 77, temperature 98 F (36.7 C), temperature source Oral, resp. rate 18, height 5\' 9"  (1.753 m), weight 72.6 kg, SpO2 99 %.Body mass index is 23.63 kg/m.  General Appearance: Disheveled  Eye Contact:  Fair  Speech:  Slurred  Volume:  Normal  Mood:  Anxious and Depressed  Affect:  Congruent  Thought Process:  Coherent and Descriptions of Associations: Intact   Orientation:  Full (Time, Place, and Person)  Thought Content:  Rumination  Suicidal Thoughts:  Yes.  with intent/plan  Homicidal Thoughts:  No  Memory:  Immediate;   Fair Recent;   Fair Remote;   Fair  Judgement:  Impaired  Insight:  Fair  Psychomotor Activity:  Decreased  Concentration:  Concentration: Fair and Attention Span: Fair  Recall:  [employer] of Knowledge:  Fair  Language:  Good  Akathisia:  No  Handed:  Right  AIMS (if indicated):     Assets:  Housing Leisure Time Physical Health Resilience Social Support  ADL's:  Intact  Cognition:  Impaired,  Mild  Sleep:        Treatment Plan Summary: Daily contact with patient to assess and evaluate symptoms and progress in treatment, Medication management and Alcohol induced mood disorder:  -Observe and reevaluate in the morning -Start CIWA protocol  Disposition: Supportive therapy provided about ongoing stressors.  Waylan Boga, NP 06/03/2020 6:16 PM

## 2020-06-03 NOTE — ED Notes (Signed)
Patient changed out of his clothes. Back in bed

## 2020-06-03 NOTE — ED Notes (Signed)
Pt belongings removed and placed in psych scrubs.  Belongings include: Blue shirt, blue jeans , blue boxer, white socks, and black shoes.  Dinner tray was offered and pt refused. "I am not in the mood at this moment. I just want to die." Will try later offering him snacks.

## 2020-06-03 NOTE — ED Notes (Signed)
VOL, pend consult

## 2020-06-03 NOTE — ED Triage Notes (Signed)
Patient from boarding home via ems with etoh like breath and SI ideations. Patient presents with laceration to left forearm, presure dressing applied by fire dept prior to ems arrival. No active bleeding noted. Last T.d unkown,

## 2020-06-03 NOTE — ED Provider Notes (Signed)
[facility] Emergency Department Provider Note  ____________________________________________   I have reviewed the triage vital signs and the nursing notes.   HISTORY  Chief Complaint Suicidal and Alcohol Intoxication   History limited by: Not Limited   HPI Miguel [name] is a 59 y.o. male who presents to the emergency department today because of concerns for self-harm.  Patient states that he cut himself on his left forearm because he was upset.  At the time my exam he states he is not as upset.  He states he became upset because his wife called him the name.  He does have history of alcohol abuse and states he did drink today.  The patient denies any recent illness.  Records reviewed. Per medical record review patient has a history of frequent ER visits. Seen one week ago for left forearm laceration.   Past Medical History:  Diagnosis Date  . Alcohol abuse   . Anxiety   . Asthma   . GERD (gastroesophageal reflux disease)   . Gout   . Hypertension   . Kidney stone   . OCD (obsessive compulsive disorder)   . Renal colic     Patient Active Problem List   Diagnosis Date Noted  . Abdominal pain 04/12/2020  . Hypertensive urgency 04/12/2020  . Alcohol withdrawal ([place]) 03/04/2020  . Right-sided chest wall pain 02/28/2020  . Alcohol abuse with alcohol-induced mood disorder ([place]) 12/02/2019  . Moderate episode of recurrent major depressive disorder ([place])   . Health care maintenance 10/26/2019  . Right knee pain 10/26/2019  . Generalized anxiety disorder 10/14/2019  . MDD (major depressive disorder), recurrent severe, without psychosis ([place]) 07/27/2019  . Social anxiety disorder 07/27/2019  . Left-sided chest wall pain 06/14/2019  . Alcoholic cirrhosis of liver without ascites ([place]) 06/14/2019  . Alcohol abuse 06/14/2019  . AKI (acute kidney injury) ([place]) 05/27/2019  . Alcohol withdrawal syndrome without complication ([place]) 18/29/9371  . Alcoholic  intoxication without complication ([place])   . Sepsis ([place]) 03/08/2019  . Breast lump or mass 10/04/2018  . Sepsis secondary to UTI ([place]) 09/14/2018  . Asthma 07/07/2018  . GERD (gastroesophageal reflux disease) 07/07/2018  . OCD (obsessive compulsive disorder) 07/07/2018  . Self-inflicted laceration of left wrist ([place]) 03/10/2018  . Leg hematoma 12/25/2017  . Suicide and self-inflicted injury by cutting and piercing instrument ([place]) 03/10/2017  . Severe recurrent major 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 ([place]) 03/09/2017  . Substance induced mood disorder ([place]) 08/15/2016  . Involuntary commitment 08/15/2016  . Alcohol use disorder, severe, dependence ([place]) 02/05/2016  . Hypertension 12/05/2015  . Tachycardia 12/05/2015  . Gout 11/13/2015  . Chronic back pain 05/02/2015    Past Surgical History:  Procedure Laterality Date  . CHOLECYSTECTOMY  2012  . COLONOSCOPY    . COLONOSCOPY WITH PROPOFOL N/A 05/28/2020   Procedure: COLONOSCOPY WITH PROPOFOL;  Surgeon: [name], MD;  Location: [facility] ENDOSCOPY;  Service: Gastroenterology;  Laterality: N/A;  . EXTRACORPOREAL SHOCK WAVE LITHOTRIPSY Left 01/12/2019   Procedure: EXTRACORPOREAL SHOCK WAVE LITHOTRIPSY (ESWL);  Surgeon: Billey Co, MD;  Location: [facility] ORS;  Service: Urology;  Laterality: Left;  . UPPER GI ENDOSCOPY      Prior to Admission medications   Medication Sig Start Date End Date Taking? Authorizing Provider  albuterol (VENTOLIN HFA) 108 (90 Base) MCG/ACT inhaler Inhale 2 puffs into the lungs every 6 (six) hours as needed. 03/21/20   Clapacs, Madie Reno, MD  allopurinol (ZYLOPRIM) 300 MG tablet Take 1 tablet (300  mg total) by mouth daily. 04/23/20   Iloabachie, Chioma E, NP  ascorbic acid (VITAMIN C) 500 MG tablet Take by mouth. 08/01/19   [provider]  chlordiazePOXIDE (LIBRIUM) 25 MG capsule Take 1 capsule (25 mg total) by mouth 2 (two) times daily. 05/27/20   Duanne Guess, PA-C  citalopram (CELEXA) 20 MG tablet  Take 1 tablet (20 mg total) by mouth daily. 03/21/20 03/21/21  Clapacs, Madie Reno, MD  cloNIDine (CATAPRES) 0.1 MG tablet Take 0.1 mg by mouth daily.    [provider]  colchicine 0.6 MG tablet Take by mouth. 08/01/19   [provider]  Fluticasone-Salmeterol (ADVAIR DISKUS) 100-50 MCG/DOSE AEPB Inhale 1 puff into the lungs 2 (two) times daily. 03/21/20   Clapacs, Madie Reno, MD  folic acid (FOLVITE) 1 MG tablet Take 1 mg by mouth daily.  08/01/19   [provider]  lisinopril (ZESTRIL) 40 MG tablet Take 1 tablet (40 mg total) by mouth daily. 04/23/20   Iloabachie, Chioma E, NP  omeprazole (PRILOSEC) 10 MG capsule Take 1 capsule (10 mg total) by mouth daily. 04/21/20   Cuthriell, Charline Bills, PA-C  ondansetron (ZOFRAN-ODT) 4 MG disintegrating tablet Take 1 tablet (4 mg total) by mouth every 8 (eight) hours as needed for nausea or vomiting. 04/21/20   Cuthriell, Charline Bills, PA-C  pantoprazole (PROTONIX) 40 MG tablet Take 1 tablet (40 mg total) by mouth daily. Patient not taking: Reported on 05/28/2020 03/21/20   Clapacs, Madie Reno, MD  polyethylene glycol-electrolytes (NULYTELY) 420 g solution Drink one 8 oz glass every 20 mins until the entire container is finished starting at 5:00pm, today 05/27/20   Lucilla Lame, MD  thiamine 100 MG tablet Take 1 tablet (100 mg total) by mouth daily. 03/22/20   Clapacs, Madie Reno, MD    Allergies Percocet [oxycodone-acetaminophen]  Family History  Problem Relation Age of Onset  . Alcohol abuse Father   . Breast cancer Mother 50    Social History Social History   Tobacco Use  . Smoking status: Former Smoker    Types: Cigarettes  . Smokeless tobacco: Never Used  . Tobacco comment: quit 30 years ago  Vaping Use  . Vaping Use: Never used  Substance Use Topics  . Alcohol use: Not Currently    Alcohol/week: 12.0 standard drinks    Types: 12 Shots of liquor per week    Comment: pint daily  . Drug use: No    Review of Systems Constitutional: No  fever/chills Eyes: No visual changes. ENT: No sore throat. Cardiovascular: Denies chest pain. Respiratory: Denies shortness of breath. Gastrointestinal: No abdominal pain.  No nausea, no vomiting.  No diarrhea.   Genitourinary: Negative for dysuria. Musculoskeletal: Negative for back pain. Skin: Positive for cut to left forearm.  Neurological: Negative for headaches, focal weakness or numbness.  ____________________________________________   PHYSICAL EXAM:  VITAL SIGNS: ED Triage Vitals  Enc Vitals Group     BP 06/03/20 1611 103/70     Pulse Rate 06/03/20 1611 77     Resp 06/03/20 1611 18     Temp 06/03/20 1611 98 F (36.7 C)     Temp Source 06/03/20 1611 Oral     SpO2 06/03/20 1611 99 %     Weight 06/03/20 1612 160 lb (72.6 kg)     Height 06/03/20 1612 5\' 9"  (1.753 m)     Head Circumference --      Peak Flow --      Pain Score 06/03/20  1612 0   Constitutional: Alert and oriented.  Eyes: Conjunctivae are normal.  ENT      Head: Normocephalic and atraumatic.      Nose: No congestion/rhinnorhea.      Mouth/Throat: Mucous membranes are moist.      Neck: No stridor. Hematological/Lymphatic/Immunilogical: No cervical lymphadenopathy. Cardiovascular: Normal rate, regular rhythm.  No murmurs, rubs, or gallops.  Respiratory: Normal respiratory effort without tachypnea nor retractions. Breath sounds are clear and equal bilaterally. No wheezes/rales/rhonchi. Gastrointestinal: Soft and non tender. No rebound. No guarding.  Genitourinary: Deferred Musculoskeletal: Normal range of motion in all extremities. No lower extremity edema. Neurologic:  Normal speech and language. No gross focal neurologic deficits are appreciated.  Skin:  Superficial lacerations to left forearm. Some older appearing lacerations to left forearm.  Psychiatric: Appears slightly intoxicated. ____________________________________________    LABS (pertinent  positives/negatives)  None  ____________________________________________   EKG  None  ____________________________________________    RADIOLOGY  None  ____________________________________________   PROCEDURES  Procedures  ____________________________________________   INITIAL IMPRESSION / ASSESSMENT AND PLAN / ED COURSE  Pertinent labs & imaging results that were available during my care of the patient were reviewed by me and considered in my medical decision making (see chart for details).   Patient presented to the emergency department today because of concerns for self-inflicted lacerations to his left forearm.  Patient does admit to drinking alcohol.  Patient has frequent ER visits for alcohol abuse and depression.  Patient's physical exam shows multiple superficial lacerations to the left forearm none of which would require advanced closure.  Will have psychiatry evaluate.  The patient has been placed in psychiatric observation due to the need to provide a safe environment for the patient while obtaining psychiatric consultation and evaluation, as well as ongoing medical and medication management to treat the patient's condition.  The patient has not been placed under full IVC at this time.   ___________________________________________   FINAL CLINICAL IMPRESSION(S) / ED DIAGNOSES  Final diagnoses:  Alcohol abuse  Depression, unspecified depression type  Laceration of left forearm, initial encounter     Note: This dictation was prepared with Dragon dictation. Any transcriptional errors that result from this process are unintentional     Nance Pear, MD 06/03/20 2249

## 2020-06-04 ENCOUNTER — Telehealth: Payer: Self-pay

## 2020-06-04 DIAGNOSIS — Z86010 Personal history of colonic polyps: Secondary | ICD-10-CM

## 2020-06-04 MED ORDER — CHLORDIAZEPOXIDE HCL 25 MG PO CAPS: 25.0000 mg | ORAL_CAPSULE | Freq: Every day | ORAL | Status: DC

## 2020-06-04 MED ORDER — CHLORDIAZEPOXIDE HCL 25 MG PO CAPS: 25.0000 mg | ORAL_CAPSULE | Freq: Two times a day (BID) | ORAL | 0 refills | Status: AC

## 2020-06-04 MED ORDER — CHLORDIAZEPOXIDE HCL 25 MG PO CAPS: 25.0000 mg | ORAL_CAPSULE | Freq: Every day | ORAL | 0 refills | Status: AC

## 2020-06-04 MED ORDER — CHLORDIAZEPOXIDE HCL 25 MG PO CAPS
25.0000 mg | ORAL_CAPSULE | Freq: Two times a day (BID) | ORAL | Status: DC
  Administered 2020-06-04: 25 mg via ORAL
  Filled 2020-06-04: qty 1

## 2020-06-04 NOTE — Telephone Encounter (Signed)
Pt has been notified of results and Dr. Anna's recommendations. 

## 2020-06-04 NOTE — ED Provider Notes (Signed)
Emergency Medicine Observation Re-evaluation Note  Miguel [name] is a 59 y.o. male, seen on rounds today.  Pt initially presented to the ED for complaints of Suicidal and Alcohol Intoxication Currently, the patient is sleeping.  Physical Exam  BP 95/67   Pulse 81   Temp 97.8 F (36.6 C)   Resp 16   Ht 1.753 m (5\' 9" )   Wt 72.6 kg   SpO2 100%   BMI 23.63 kg/m  Physical Exam   General:  No acute distress Resp:  Breathing comfortably and easily, no wheezing Neuro:  Moving all four extremities, no gross focal neuro deficits Psych:  calm, cooperative, here voluntarily  ED Course / MDM  EKG:    I have reviewed the labs performed to date as well as medications administered while in observation.  Recent changes in the last 24 hours include medical clearance and initial assessment by psych team. Plan  Current plan is for reassessment by psych team this morning to determine appropriate disposition.. Patient is not under full IVC at this time.   Hinda Kehr, MD 06/04/20 1004

## 2020-06-04 NOTE — ED Notes (Signed)
Pt discharging home. Discharge teaching and prescription reviewed with pt and he verbalized understanding. Pt signed discharge paper form. Given all his personal belongings. Escorted to lobby, ambulatory and in NAD. Given bus pass per his request.

## 2020-06-04 NOTE — BH Assessment (Signed)
Assessment Note  Miguel [name] is an 59 y.o. male. Pt presented to the ED via EMS, voluntarily due to ETOH and endorsing SI.  Upon interview, the patient presented as drowsy, with a disheveled appearance. The patient had tangential and circumstantial thought processes. The patient had a depressed mood and a sullen affect. The patient reported that his main stressors are his finances and relationship conflict with his wife. The patient reported that he is unemployed and he and his wife live off of her $81 a month disability check.  The patient became tearful explaining his financial situation. The patient reported that he has no friends or family supports. The pt. expressed that he has been married 23 years and that he takes care of his wife who has MS. The patient identified the stress of the discord between his wife's many caregivers drove him to drinking too much today. The pt. reported that he has no outpatient services or previous psych hospitalizations. The pt. admitted to excessively drinking cheap alcohol, daily and denied using any other substances. The patient expressed that he uses alcohol after frequent arguments with his wife who yells and screams. The patient endorsed symptoms of depression such as hopelessness, worthlessness, and anhedonia. The pt. denied SI, HI, and AV/H, however he expressed that he'd cut his forearm with a screwdriver as a result of being intoxicated.    Diagnosis: Major Depressive Disorder Alcohol abuse with alcohol-induced mood disorder (HCC) Active Problems:   Alcohol use disorder, severe, dependence ([place])   Past Medical History:  Past Medical History:  Diagnosis Date  . Alcohol abuse   . Anxiety   . Asthma   . GERD (gastroesophageal reflux disease)   . Gout   . Hypertension   . Kidney stone   . OCD (obsessive compulsive disorder)   . Renal colic     Past Surgical History:  Procedure Laterality Date  . CHOLECYSTECTOMY  2012  . COLONOSCOPY    .  COLONOSCOPY WITH PROPOFOL N/A 05/28/2020   Procedure: COLONOSCOPY WITH PROPOFOL;  Surgeon: [name], MD;  Location[facility] ENDOSCOPY;  Service: Gastroenterology;  Laterality: N/A;  . EXTRACORPOREAL SHOCK WAVE LITHOTRIPSY Left 01/12/2019   Procedure: EXTRACORPOREAL SHOCK WAVE LITHOTRIPSY (ESWL);  Surgeon: Billey Co, MD;  Location: [facility] ORS;  Service: Urology;  Laterality: Left;  . UPPER GI ENDOSCOPY      Family History:  Family History  Problem Relation Age of Onset  . Alcohol abuse Father   . Breast cancer Mother 87    Social History:  reports that he has quit smoking. His smoking use included cigarettes. He has never used smokeless tobacco. He reports previous alcohol use of about 12.0 standard drinks of alcohol per week. He reports that he does not use drugs.  Additional Social History:  Alcohol / Drug Use Pain Medications: See PTA Prescriptions: See PTA History of alcohol / drug use?: Yes Negative Consequences of Use: Personal relationships, Financial Substance #1 Name of Substance 1: Alcohol 1 - Amount (size/oz): 1 pint 1 - Frequency: Daily  CIWA: CIWA-Ar BP: 95/67 Pulse Rate: 81 COWS:    Allergies:  Allergies  Allergen Reactions  . Percocet [Oxycodone-Acetaminophen] Nausea And Vomiting    Took on an empty stomach, can tolerate on a full stomach.    Home Medications: (Not in a hospital admission)   OB/GYN Status:  No LMP for male patient.  General Assessment Data Location of Assessment: [facility] ED TTS Assessment: In system Is this a Tele or Face-to-Face Assessment?:  Face-to-Face Is this an Initial Assessment or a Re-assessment for this encounter?: Initial Assessment Patient Accompanied by:: N/A Language Other than English: No Living Arrangements: Other (Comment) What gender do you identify as?: Male Date Telepsych consult ordered in [facility]: 06/03/20 Time Telepsych consult ordered in [facility]: [place] Marital status: Married [place] name: n/a Pregnancy Status:  No Living Arrangements: Spouse/significant other Can pt return to current living arrangement?: Yes Admission Status: Voluntary Is patient capable of signing voluntary admission?: Yes Referral Source: Self/Family/Friend Insurance type: None  Medical Screening Exam ([place]) Medical Exam completed: Yes  Crisis Care Plan Living Arrangements: Spouse/significant other Legal Guardian:  (Self) Name of Psychiatrist: None Noted Name of Therapist: None Noted  Education Status Is patient currently in school?: No Is the patient employed, unemployed or receiving disability?: Unemployed  Risk to self with the past 6 months Suicidal Ideation: No Has patient been a risk to self within the past 6 months prior to admission? : Yes Suicidal Intent: No Has patient had any suicidal intent within the past 6 months prior to admission? : Yes Is patient at risk for suicide?: No Suicidal Plan?: No Has patient had any suicidal plan within the past 6 months prior to admission? : No Access to Means: No What has been your use of drugs/alcohol within the last 12 months?: Alcohol Previous Attempts/Gestures: Yes How many times?: 1 Other Self Harm Risks: Depression Triggers for Past Attempts: Spouse contact (Drinking) Intentional Self Injurious Behavior: Cutting Comment - Self Injurious Behavior: Patient has a laceration to his left forearm Family Suicide History: Unknown Recent stressful life event(s): Conflict (Comment), Financial Problems Persecutory voices/beliefs?: No Depression: Yes Depression Symptoms: Tearfulness, Feeling worthless/self pity Substance abuse history and/or treatment for substance abuse?: Yes Suicide prevention information given to non-admitted patients: Not applicable  Risk to Others within the past 6 months Homicidal Ideation: No Does patient have any lifetime risk of violence toward others beyond the six months prior to admission? : No Thoughts of Harm to Others:  No Current Homicidal Intent: No Current Homicidal Plan: No Access to Homicidal Means: No Identified Victim: n/a History of harm to others?: No Assessment of Violence: None Noted Violent Behavior Description: n/a Does patient have access to weapons?: No Criminal Charges Pending?: No Does patient have a court date: No Is patient on probation?: No  Psychosis Hallucinations: None noted Delusions: None noted  Mental Status Report Appearance/Hygiene: Disheveled Eye Contact: Fair Motor Activity: Freedom of movement Speech: Slurred, Tangential Level of Consciousness: Alert Mood: Despair, Worthless, low self-esteem, Sad, Depressed Affect: Depressed Anxiety Level: Moderate Thought Processes: Coherent, Relevant, Circumstantial, Tangential Judgement: Impaired Orientation: Person, Place, Time, Situation Obsessive Compulsive Thoughts/Behaviors: None  Cognitive Functioning Concentration: Normal Memory: Recent Intact, Remote Intact Is patient IDD: No Insight: Fair Impulse Control: Poor Appetite: Poor Have you had any weight changes? : No Change Sleep: Decreased (affected by drinking) Vegetative Symptoms: None  ADLScreening [facility] Assessment Services) Patient's cognitive ability adequate to safely complete daily activities?: Yes Patient able to express need for assistance with ADLs?: Yes Independently performs ADLs?: Yes (appropriate for developmental age)  Prior Inpatient Therapy Prior Inpatient Therapy: No  Prior Outpatient Therapy Prior Outpatient Therapy: No Does patient have an ACCT team?: No Does patient have Intensive In-House Services?  : No Does patient have [facility] services? : No Does patient have P4CC services?: No  ADL Screening (condition at time of admission) Patient's cognitive ability adequate to safely complete daily activities?: Yes Is the patient deaf or have difficulty hearing?: No  Does the patient have difficulty seeing, even when wearing  glasses/contacts?: No Does the patient have difficulty concentrating, remembering, or making decisions?: No Patient able to express need for assistance with ADLs?: Yes Does the patient have difficulty dressing or bathing?: No Independently performs ADLs?: Yes (appropriate for developmental age) Does the patient have difficulty walking or climbing stairs?: No Weakness of Legs: None Weakness of Arms/Hands: None  Home Assistive Devices/Equipment Home Assistive Devices/Equipment: None  Therapy Consults (therapy consults require a physician order) PT Evaluation Needed: No OT Evalulation Needed: No SLP Evaluation Needed: No   Values / Beliefs Cultural Requests During Hospitalization: None Spiritual Requests During Hospitalization: None Consults Spiritual Care Consult Needed: No Transition of Care Team Consult Needed: No Advance Directives (For Healthcare) Does Patient Have a Medical Advance Directive?: No Would patient like information on creating a medical advance directive?: No - Guardian declined          Disposition: Per psych NP Waylan Boga, pt is to be observed and reevaluated in the AM.  Disposition Initial Assessment Completed for this Encounter: Yes  On Site Evaluation by:   Reviewed with Physician:    Kathi Ludwig 06/04/2020 1:24 AM

## 2020-06-04 NOTE — ED Provider Notes (Signed)
-----------------------------------------   11:15 AM on 06/04/2020 -----------------------------------------  Patient is voluntary and wants to go home.  Determined safe to do so by behavioral medicine.  Discharged with usual precautions.   Hinda Kehr, MD 06/04/20 1116

## 2020-06-04 NOTE — Discharge Instructions (Addendum)
[facility] ([facility]) [facility]  Mental health service in [place], [place] Address: [address], [place], [place] [address] Hours:  Open ? Closes 5PM Phone: [phone]    Alcohol Use Disorder Alcohol use disorder is when your drinking disrupts your daily life. When you have this condition, you drink too much alcohol and you cannot control your drinking. Alcohol use disorder can cause serious problems with your physical health. It can affect your brain, heart, liver, pancreas, immune system, stomach, and intestines. Alcohol use disorder can increase your risk for certain cancers and cause problems with your mental health, such as depression, anxiety, psychosis, delirium, and dementia. People with this disorder risk hurting themselves and others. What are the causes? This condition is caused by drinking too much alcohol over time. It is not caused by drinking too much alcohol only one or two times. Some people with this condition drink alcohol to cope with or escape from negative life events. Others drink to relieve pain or symptoms of mental illness. What increases the risk? You are more likely to develop this condition if:  You have a family history of alcohol use disorder.  Your culture encourages drinking to the point of intoxication, or makes alcohol easy to get.  You had a mood or conduct disorder in childhood.  You have been a victim of abuse.  You are an adolescent and: ? You have poor grades or difficulties in school. ? Your caregivers do not talk to you about saying no to alcohol, or supervise your activities. ? You are impulsive or you have trouble with self-control. What are the signs or symptoms? Symptoms of this condition include:  Drinkingmore than you want to.  Drinking for longer than you want to.  Trying several times to drink less or to  control your drinking.  Spending a lot of time getting alcohol, drinking, or recovering from drinking.  Craving alcohol.  Having problems at work, at school, or at home due to drinking.  Having problems in relationships due to drinking.  Drinking when it is dangerous to drink, such as before driving a car.  Continuing to drink even though you know you might have a physical or mental problem related to drinking.  Needing more and more alcohol to get the same effect you want from the alcohol (building up tolerance).  Having symptoms of withdrawal when you stop drinking. Symptoms of withdrawal include: ? Fatigue. ? Nightmares. ? Trouble sleeping. ? Depression. ? Anxiety. ? Fever. ? Seizures. ? Severe confusion. ? Feeling or seeing things that are not there (hallucinations). ? Tremors. ? Rapid heart rate. ? Rapid breathing. ? High blood pressure.  Drinking to avoid symptoms of withdrawal. How is this diagnosed? This condition is diagnosed with an assessment. Your health care provider may start the assessment by asking three or four questions about your drinking. Your health care provider may perform a physical exam or do lab tests to see if you have physical problems resulting from alcohol use. She or he may refer you to a mental health professional for evaluation. How is this treated? Some people with alcohol use disorder are able to reduce their alcohol use to low-risk levels. Others need to completely quit drinking alcohol. When necessary, mental health professionals with specialized training in substance use treatment can help. Your health care provider can help you decide how severe your alcohol use disorder is and what  type of treatment you need. The following forms of treatment are available:  Detoxification. Detoxification involves quitting drinking and using prescription medicines within the first week to help lessen withdrawal symptoms. This treatment is important for  people who have had withdrawal symptoms before and for heavy drinkers who are likely to have withdrawal symptoms. Alcohol withdrawal can be dangerous, and in severe cases, it can cause death. Detoxification may be provided in a home, community, or primary care setting, or in a hospital or substance use treatment facility.  Counseling. This treatment is also called talk therapy. It is provided by substance use treatment counselors. A counselor can address the reasons you use alcohol and suggest ways to keep you from drinking again or to prevent problem drinking. The goals of talk therapy are to: ? Find healthy activities and ways for you to cope with stress. ? Identify and avoid the things that trigger your alcohol use. ? Help you learn how to handle cravings.  Medicines.Medicines can help treat alcohol use disorder by: ? Decreasing alcohol cravings. ? Decreasing the positive feeling you have when you drink alcohol. ? Causing an uncomfortable physical reaction when you drink alcohol (aversion therapy).  Support groups. Support groups are led by people who have quit drinking. They provide emotional support, advice, and guidance. These forms of treatment are often combined. Some people with this condition benefit from a combination of treatments provided by specialized substance use treatment centers. Follow these instructions at home:  Take over-the-counter and prescription medicines only as told by your health care provider.  Check with your health care provider before starting any new medicines.  Ask friends and family members not to offer you alcohol.  Avoid situations where alcohol is served, including gatherings where others are drinking alcohol.  Create a plan for what to do when you are tempted to use alcohol.  Find hobbies or activities that you enjoy that do not include alcohol.  Keep all follow-up visits as told by your health care provider. This is important. How is this  prevented?  If you drink, limit alcohol intake to no more than 1 drink a day for nonpregnant women and 2 drinks a day for men. One drink equals 12 oz of beer, 5 oz of wine, or 1 oz of hard liquor.  If you have a mental health condition, get treatment and support.  Do not give alcohol to adolescents.  If you are an adolescent: ? Do not drink alcohol. ? Do not be afraid to say no if someone offers you alcohol. Speak up about why you do not want to drink. You can be a positive role model for your friends and set a good example for those around you by not drinking alcohol. ? If your friends drink, spend time with others who do not drink alcohol. Make new friends who do not use alcohol. ? Find healthy ways to manage stress and emotions, such as meditation or deep breathing, exercise, spending time in nature, listening to music, or talking with a trusted friend or family member. Contact a health care provider if:  You are not able to take your medicines as told.  Your symptoms get worse.  You return to drinking alcohol (relapse) and your symptoms get worse. Get help right away if:  You have thoughts about hurting yourself or others. If you ever feel like you may hurt yourself or others, or have thoughts about taking your own life, get help right away. You can go to your  nearest emergency department or call:  Your local emergency services (911 in the [place]  A suicide crisis helpline, such as the [facility] at [phone]. This is open 24 hours a day. Summary  Alcohol use disorder is when your drinking disrupts your daily life. When you have this condition, you drink too much alcohol and you cannot control your drinking.  Treatment may include detoxification, counseling, medicine, and support groups.  Ask friends and family members not to offer you alcohol. Avoid situations where alcohol is served.  Get help right away if you have thoughts about hurting  yourself or others. This information is not intended to replace advice given to you by your health care provider. Make sure you discuss any questions you have with your health care provider. Document Revised: 10/15/2017 Document Reviewed: 07/30/2016 Elsevier Patient Education  [place].

## 2020-06-04 NOTE — Consult Note (Signed)
[facility] Psych ED Discharge  06/04/2020 10:27 AM Miguel [name]  MRN:  [id number] Principal Problem: Alcohol abuse with alcohol-induced mood disorder [facility]) Discharge Diagnoses: Principal Problem:   Alcohol abuse with alcohol-induced mood disorder ([place]) Active Problems:   Alcohol use disorder, severe, dependence ([place] [facility])  Subjective: "Not feeling bad, too much drink."  Patient seen and evaluated in person by this provider.  States he started drinking and got upset, took a screwdriver and scratched his left arm.  Today he is calm, coherent, and cooperative.  Denies suicidal/homicidal ideations, hallucinations, withdrawal symptoms, paranoia, mania, and other psychiatric concerns besides depression.  His depression is at a moderate level and increases with boredom and decreases with support and being busy.  His depression increases in the morning when his wife's caregivers are they are.  When he becomes more depressed, he drinks makes things worse.  Detox and rehab recommended, patient declined.  He does not like going to groups does not feel that he has anything in common with people in the groups in the last time he was admitted did not get anything out.  He did well this provider to speak to his wife who had no current safety concerns that he will harm himself, does feel it would be beneficial if he had stated to speak with.  She does report he has been to several rehabs with little effect.  She, Miguel [name], also states the following, "When he drinks, he turns into a whole different person.  When he is not, he's a good person.  He's bored to death, needs someone to talk to, does not like groups."  No safety concerns a this time.   Dr. Dwyane Dee reviewed the client and concurs with the plan to discharge as he is psychiatrically stable.  Total Time spent with patient: 45 minutes  Past Psychiatric History: depression, anxiety, alcohol use d/o  Past Medical History:  Past Medical History:  Diagnosis Date  .  Alcohol abuse   . Anxiety   . Asthma   . GERD (gastroesophageal reflux disease)   . Gout   . Hypertension   . Kidney stone   . OCD (obsessive compulsive disorder)   . Renal colic     Past Surgical History:  Procedure Laterality Date  . CHOLECYSTECTOMY  2012  . COLONOSCOPY    . COLONOSCOPY WITH PROPOFOL N/A 05/28/2020   Procedure: COLONOSCOPY WITH PROPOFOL;  Surgeon: [name], MD;  Location: [facility] ENDOSCOPY;  Service: Gastroenterology;  Laterality: N/A;  . EXTRACORPOREAL SHOCK WAVE LITHOTRIPSY Left 01/12/2019   Procedure: EXTRACORPOREAL SHOCK WAVE LITHOTRIPSY (ESWL);  Surgeon: Billey Co, MD;  Location: [facility] ORS;  Service: Urology;  Laterality: Left;  . UPPER GI ENDOSCOPY     Family History:  Family History  Problem Relation Age of Onset  . Alcohol abuse Father   . Breast cancer Mother 63   Family Psychiatric  History: see above Social History:  Social History   Substance and Sexual Activity  Alcohol Use Not Currently  . Alcohol/week: 12.0 standard drinks  . Types: 12 Shots of liquor per week   Comment: pint daily     Social History   Substance and Sexual Activity  Drug Use No    Social History   Socioeconomic History  . Marital status: Married    Spouse name: Not on file  . Number of children: Not on file  . Years of education: Not on file  . Highest education level: Not on file  Occupational History  .  Not on file  Tobacco Use  . Smoking status: Former Smoker    Types: Cigarettes  . Smokeless tobacco: Never Used  . Tobacco comment: quit 30 years ago  Vaping Use  . Vaping Use: Never used  Substance and Sexual Activity  . Alcohol use: Not Currently    Alcohol/week: 12.0 standard drinks    Types: 12 Shots of liquor per week    Comment: pint daily  . Drug use: No  . Sexual activity: Not on file  Other Topics Concern  . Not on file  Social History Narrative   Lives at home with his wife and takes care of his wife. Independent at baseline      -  Biggest strain is financial but doesn't think they could got get more help.      Patient expressed interest in possible financial assistance. Informed written consent obtained   Social Determinants of Health   Financial Resource Strain:   . Difficulty of Paying Living Expenses:   Food Insecurity:   . Worried About Charity fundraiser in the Last Year:   . Arboriculturist in the Last Year:   Transportation Needs:   . Film/video editor (Medical):   Marland Kitchen Lack of Transportation (Non-Medical):   Physical Activity:   . Days of Exercise per Week:   . Minutes of Exercise per Session:   Stress:   . Feeling of Stress :   Social Connections:   . Frequency of Communication with Friends and Family:   . Frequency of Social Gatherings with Friends and Family:   . Attends Religious Services:   . Active Member of Clubs or Organizations:   . Attends Archivist Meetings:   Marland Kitchen Marital Status:     Has this patient used any form of tobacco in the last 30 days? (Cigarettes, Smokeless Tobacco, Cigars, and/or Pipes) A prescription for an FDA-approved tobacco cessation medication was offered at discharge and the patient refused  Current Medications: Current Facility-Administered Medications  Medication Dose Route Frequency Provider Last Rate Last Admin  . hydrOXYzine (ATARAX/VISTARIL) tablet 25 mg  25 mg Oral Q6H PRN Patrecia Pour, NP      . loperamide (IMODIUM) capsule 2-4 mg  2-4 mg Oral PRN Patrecia Pour, NP      . LORazepam (ATIVAN) tablet 1 mg  1 mg Oral Q6H PRN Patrecia Pour, NP      . LORazepam (ATIVAN) tablet 1 mg  1 mg Oral QID Patrecia Pour, NP   1 mg at 06/04/20 1017   Followed by  . [START ON 06/05/2020] LORazepam (ATIVAN) tablet 1 mg  1 mg Oral TID Patrecia Pour, NP       Followed by  . [START ON 06/06/2020] LORazepam (ATIVAN) tablet 1 mg  1 mg Oral BID Patrecia Pour, NP       Followed by  . [START ON 06/07/2020] LORazepam (ATIVAN) tablet 1 mg  1 mg Oral Daily Waylan Boga Y, NP      . multivitamin with minerals tablet 1 tablet  1 tablet Oral Daily Patrecia Pour, NP   1 tablet at 06/04/20 1017  . ondansetron (ZOFRAN-ODT) disintegrating tablet 4 mg  4 mg Oral Q6H PRN Patrecia Pour, NP      . thiamine tablet 100 mg  100 mg Oral Daily Patrecia Pour, NP   100 mg at 06/04/20 1019   Current Outpatient Medications  Medication Sig Dispense Refill  .  albuterol (VENTOLIN HFA) 108 (90 Base) MCG/ACT inhaler Inhale 2 puffs into the lungs every 6 (six) hours as needed. 6.7 g 1  . allopurinol (ZYLOPRIM) 300 MG tablet Take 1 tablet (300 mg total) by mouth daily. 30 tablet 2  . ascorbic acid (VITAMIN C) 500 MG tablet Take by mouth.    . chlordiazePOXIDE (LIBRIUM) 25 MG capsule Take 1 capsule (25 mg total) by mouth 2 (two) times daily. 10 capsule 0  . citalopram (CELEXA) 20 MG tablet Take 1 tablet (20 mg total) by mouth daily. 30 tablet 1  . cloNIDine (CATAPRES) 0.1 MG tablet Take 0.1 mg by mouth daily.    . colchicine 0.6 MG tablet Take by mouth.    . Fluticasone-Salmeterol (ADVAIR DISKUS) 100-50 MCG/DOSE AEPB Inhale 1 puff into the lungs 2 (two) times daily. 540 each 0  . folic acid (FOLVITE) 1 MG tablet Take 1 mg by mouth daily.     Marland Kitchen lisinopril (ZESTRIL) 40 MG tablet Take 1 tablet (40 mg total) by mouth daily. 30 tablet 2  . omeprazole (PRILOSEC) 10 MG capsule Take 1 capsule (10 mg total) by mouth daily. 30 capsule 4  . ondansetron (ZOFRAN-ODT) 4 MG disintegrating tablet Take 1 tablet (4 mg total) by mouth every 8 (eight) hours as needed for nausea or vomiting. 20 tablet 0  . pantoprazole (PROTONIX) 40 MG tablet Take 1 tablet (40 mg total) by mouth daily. (Patient not taking: Reported on 05/28/2020) 30 tablet 1  . polyethylene glycol-electrolytes (NULYTELY) 420 g solution Drink one 8 oz glass every 20 mins until the entire container is finished starting at 5:00pm, today 4000 mL 0  . thiamine 100 MG tablet Take 1 tablet (100 mg total) by mouth daily. 30 tablet 1    PTA Medications: (Not in a hospital admission)   Musculoskeletal: Strength & Muscle Tone: within normal limits Gait & Station: normal Patient leans: N/A  Psychiatric Specialty Exam: Physical Exam Vitals and nursing note reviewed.  Constitutional:      Appearance: Normal appearance.  HENT:     Head: Normocephalic.  Pulmonary:     Effort: Pulmonary effort is normal.  Musculoskeletal:        General: Normal range of motion.     Cervical back: Normal range of motion.  Neurological:     General: No focal deficit present.     Mental Status: He is alert and oriented to person, place, and time.  Psychiatric:        Attention and Perception: Attention and perception normal.        Mood and Affect: Mood is depressed.        Speech: Speech normal.        Behavior: Behavior normal. Behavior is cooperative.        Thought Content: Thought content normal.        Cognition and Memory: Cognition and memory normal.        Judgment: Judgment normal.     Review of Systems  Psychiatric/Behavioral: Positive for dysphoric mood.  All other systems reviewed and are negative.   Blood pressure 95/67, pulse 81, temperature 97.8 F (36.6 C), resp. rate 16, height 5\' 9"  (1.753 m), weight 72.6 kg, SpO2 100 %.Body mass index is 23.63 kg/m.  General Appearance: Casual  Eye Contact:  Good  Speech:  Normal Rate  Volume:  Normal  Mood:  Depressed  Affect:  Congruent  Thought Process:  Coherent and Descriptions of Associations: Intact  Orientation:  Full (Time, Place,  and Person)  Thought Content:  WDL and Logical  Suicidal Thoughts:  No  Homicidal Thoughts:  No  Memory:  Immediate;   Good Recent;   Good Remote;   Good  Judgement:  Fair  Insight:  Fair  Psychomotor Activity:  Decreased  Concentration:  Concentration: Good and Attention Span: Good  Recall:  Good  Fund of Knowledge:  Good  Language:  Good  Akathisia:  No  Handed:  Right  AIMS (if indicated):     Assets:   Housing Intimacy Leisure Time Physical Health Resilience Social Support  ADL's:  Intact  Cognition:  WNL  Sleep:        Demographic Factors:  Male and Caucasian  Loss Factors: NA  Historical Factors: NA  Risk Reduction Factors:   Sense of responsibility to family, Living with another person, especially a relative and Positive social support  Continued Clinical Symptoms:  Depression,  moderate  Cognitive Features That Contribute To Risk:  None    Suicide Risk:  Minimal: No identifiable suicidal ideation.  Patients presenting with no risk factors but with morbid ruminations; may be classified as minimal risk based on the severity of the depressive symptoms   Plan Of Care/Follow-up recommendations:  Alcohol use disorder: -Follow up with [facility] -Librium 25 mg BID for 2 days then daily for 2 days, then discontinue  Depression: -Continue Celexa 20 mg daily Activity:  as tolerated Diet:  heart healthy diet  Disposition: discharge home Waylan Boga, NP 06/04/2020, 10:27 AM

## 2020-06-04 NOTE — Telephone Encounter (Signed)
-----   Message from [name], MD sent at 05/30/2020  1:56 PM EDT ----- Inform that he had over 9 polyps resected.  Considering the fact that he has had multiple adenomas in the past.  Would suggest colonoscopy in 1 year.  Also suggested him that if interested we could refer to genetics to determine if there is any reason why he has had s numerous polyps

## 2020-06-05 ENCOUNTER — Encounter: Payer: Self-pay | Admitting: Gerontology

## 2020-06-05 ENCOUNTER — Other Ambulatory Visit: Payer: Self-pay

## 2020-06-05 ENCOUNTER — Ambulatory Visit: Payer: Medicaid Other | Admitting: Gerontology

## 2020-06-05 VITALS — BP 110/78 | HR 89 | Ht 72.0 in | Wt 189.9 lb

## 2020-06-05 DIAGNOSIS — F1010 Alcohol abuse, uncomplicated: Secondary | ICD-10-CM

## 2020-06-05 DIAGNOSIS — Z09 Encounter for follow-up examination after completed treatment for conditions other than malignant neoplasm: Secondary | ICD-10-CM | POA: Insufficient documentation

## 2020-06-05 DIAGNOSIS — R944 Abnormal results of kidney function studies: Secondary | ICD-10-CM | POA: Insufficient documentation

## 2020-06-05 NOTE — Progress Notes (Signed)
Established Patient Office Visit  Subjective:  Patient ID: Miguel [name], male    DOB: Apr 11, 1961  Age: 59 y.o. MRN: [id number]  CC:  Chief Complaint  Patient presents with  . Follow-up    HPI Miguel [name] presents for follow up after hospital discharge and lab review. Miguel [name] states that Miguel [name]'s compliant with his medications and continues to work on making healthy lifestyle changes. Miguel [name] was discharged from the hospital on 06/04/2020 for alcohol abuse. Currently, Miguel [name] states that Miguel [name] has not had any alcoholic beverage since discharge. Miguel [name] reports that Miguel [name] will follow up at [facility] for alcohol abuse. Miguel [name] had Colonoscopy done on 05/28/2020 and it showed Diverticulosis in the sigmoid colon. - Four 4 to 8 mm polyps in the ascending colon, removed with a cold snare. Resected and retrieved. - Five 4 to 7 mm polyps in the descending colon, removed with a cold snare. Resected and retrieved. - The examination was otherwise normal on direct and retroflexion views per Dr Bailey Mech A, and Miguel [name] will follow up with another screening in 3-5 years depending on the Pathology report. His Kidney function was rechecked on 05/23/2020, Serum creatinine increased from 1.86 mg/dl  to 1.94 mg/dl and eGFR decreased from 38 to 37. Miguel [name] states that Miguel [name] has increased his fluid intake. Overall, Miguel [name] states that Miguel [name]'s doing well and offers no further complaint.  Past Medical History:  Diagnosis Date  . Alcohol abuse   . Anxiety   . Asthma   . GERD (gastroesophageal reflux disease)   . Gout   . Hypertension   . Kidney stone   . OCD (obsessive compulsive disorder)   . Renal colic     Past Surgical History:  Procedure Laterality Date  . CHOLECYSTECTOMY  2012  . COLONOSCOPY    . COLONOSCOPY WITH PROPOFOL N/A 05/28/2020   Procedure: COLONOSCOPY WITH PROPOFOL;  Surgeon: [name], MD;  Location: [facility] ENDOSCOPY;  Service: Gastroenterology;  Laterality: N/A;  . EXTRACORPOREAL SHOCK WAVE LITHOTRIPSY Left 01/12/2019   Procedure: EXTRACORPOREAL SHOCK  WAVE LITHOTRIPSY (ESWL);  Surgeon: Billey Co, MD;  Location: [facility] ORS;  Service: Urology;  Laterality: Left;  . UPPER GI ENDOSCOPY      Family History  Problem Relation Age of Onset  . Alcohol abuse Father   . Breast cancer Mother 64    Social History   Socioeconomic History  . Marital status: Married    Spouse name: Not on file  . Number of children: Not on file  . Years of education: Not on file  . Highest education level: Not on file  Occupational History  . Not on file  Tobacco Use  . Smoking status: Former Smoker    Types: Cigarettes  . Smokeless tobacco: Never Used  . Tobacco comment: quit 30 years ago  Vaping Use  . Vaping Use: Never used  Substance and Sexual Activity  . Alcohol use: Not Currently    Alcohol/week: 12.0 standard drinks    Types: 12 Shots of liquor per week    Comment: pint daily  . Drug use: No  . Sexual activity: Not on file  Other Topics Concern  . Not on file  Social History Narrative   Lives at home with his wife and takes care of his wife. Independent at baseline      - Biggest strain is financial but doesn't think they could got get more help.      Patient expressed interest in possible financial assistance. Informed written consent obtained  Social Determinants of Health   Financial Resource Strain:   . Difficulty of Paying Living Expenses:   Food Insecurity:   . Worried About Charity fundraiser in the Last Year:   . Arboriculturist in the Last Year:   Transportation Needs:   . Film/video editor (Medical):   Marland Kitchen Lack of Transportation (Non-Medical):   Physical Activity:   . Days of Exercise per Week:   . Minutes of Exercise per Session:   Stress:   . Feeling of Stress :   Social Connections:   . Frequency of Communication with Friends and Family:   . Frequency of Social Gatherings with Friends and Family:   . Attends Religious Services:   . Active Member of Clubs or Organizations:   . Attends Theatre manager Meetings:   Marland Kitchen Marital Status:   Intimate Partner Violence:   . Fear of Current or Ex-Partner:   . Emotionally Abused:   Marland Kitchen Physically Abused:   . Sexually Abused:     Outpatient Medications Prior to Visit  Medication Sig Dispense Refill  . albuterol (VENTOLIN HFA) 108 (90 Base) MCG/ACT inhaler Inhale 2 puffs into the lungs every 6 (six) hours as needed. 6.7 g 1  . allopurinol (ZYLOPRIM) 300 MG tablet Take 1 tablet (300 mg total) by mouth daily. 30 tablet 2  . ascorbic acid (VITAMIN C) 500 MG tablet Take by mouth.    . chlordiazePOXIDE (LIBRIUM) 25 MG capsule Take 1 capsule (25 mg total) by mouth 2 (two) times daily for 2 days. 4 capsule 0  . [START ON 06/06/2020] chlordiazePOXIDE (LIBRIUM) 25 MG capsule Take 1 capsule (25 mg total) by mouth daily for 2 days. 2 capsule 0  . citalopram (CELEXA) 20 MG tablet Take 1 tablet (20 mg total) by mouth daily. 30 tablet 1  . cloNIDine (CATAPRES) 0.1 MG tablet Take 0.1 mg by mouth daily.    . colchicine 0.6 MG tablet Take by mouth.    . Fluticasone-Salmeterol (ADVAIR DISKUS) 100-50 MCG/DOSE AEPB Inhale 1 puff into the lungs 2 (two) times daily. 621 each 0  . folic acid (FOLVITE) 1 MG tablet Take 1 mg by mouth daily.     Marland Kitchen lisinopril (ZESTRIL) 40 MG tablet Take 1 tablet (40 mg total) by mouth daily. 30 tablet 2  . omeprazole (PRILOSEC) 10 MG capsule Take 1 capsule (10 mg total) by mouth daily. 30 capsule 4  . ondansetron (ZOFRAN-ODT) 4 MG disintegrating tablet Take 1 tablet (4 mg total) by mouth every 8 (eight) hours as needed for nausea or vomiting. 20 tablet 0  . polyethylene glycol-electrolytes (NULYTELY) 420 g solution Drink one 8 oz glass every 20 mins until the entire container is finished starting at 5:00pm, today 4000 mL 0  . thiamine 100 MG tablet Take 1 tablet (100 mg total) by mouth daily. 30 tablet 1  . pantoprazole (PROTONIX) 40 MG tablet Take 1 tablet (40 mg total) by mouth daily. 30 tablet 1   No facility-administered  medications prior to visit.    Allergies  Allergen Reactions  . Percocet [Oxycodone-Acetaminophen] Nausea And Vomiting    Took on an empty stomach, can tolerate on a full stomach.    ROS Review of Systems  Constitutional: Negative.   Respiratory: Negative.   Cardiovascular: Negative.   Neurological: Negative.   Psychiatric/Behavioral: Negative.       Objective:    Physical Exam Constitutional:      Appearance: Normal appearance.  HENT:  Head: Normocephalic and atraumatic.  Eyes:     Extraocular Movements: Extraocular movements intact.     Pupils: Pupils are equal, round, and reactive to light.  Cardiovascular:     Rate and Rhythm: Normal rate and regular rhythm.     Pulses: Normal pulses.     Heart sounds: Normal heart sounds.  Pulmonary:     Effort: Pulmonary effort is normal.     Breath sounds: Normal breath sounds.  Neurological:     General: No focal deficit present.     Mental Status: Miguel [name] is alert and oriented to person, place, and time. Mental status is at baseline.  Psychiatric:        Mood and Affect: Mood normal.        Behavior: Behavior normal.        Thought Content: Thought content normal.        Judgment: Judgment normal.     BP 110/78 (BP Location: Right Arm, Patient Position: Sitting)   Pulse 89   Ht 6' (1.829 m)   Wt 189 lb 14.4 oz (86.1 kg)   SpO2 98%   BMI 25.76 kg/m  Wt Readings from Last 3 Encounters:  06/05/20 189 lb 14.4 oz (86.1 kg)  06/03/20 160 lb (72.6 kg)  05/28/20 185 lb (83.9 kg)     Health Maintenance Due  Topic Date Due  . Hepatitis C Screening  Never done  . COVID-19 Vaccine (1) Never done    There are no preventive care reminders to display for this patient.  Lab Results  Component Value Date   TSH 1.831 03/18/2020   Lab Results  Component Value Date   WBC 8.0 05/23/2020   HGB 12.3 (L) 05/23/2020   HCT 36.1 (L) 05/23/2020   MCV 87.2 05/23/2020   PLT 237 05/23/2020   Lab Results  Component Value Date    NA 136 05/23/2020   K 4.7 05/23/2020   CO2 22 05/23/2020   GLUCOSE 97 05/23/2020   BUN 31 (H) 05/23/2020   CREATININE 1.94 (H) 05/23/2020   BILITOT 0.8 05/22/2020   ALKPHOS 90 05/22/2020   AST 9 05/22/2020   ALT 8 05/22/2020   PROT 7.1 05/22/2020   ALBUMIN 4.5 05/22/2020   CALCIUM 8.9 05/23/2020   ANIONGAP 10 05/23/2020   Lab Results  Component Value Date   CHOL 155 05/22/2020   Lab Results  Component Value Date   HDL 65 05/22/2020   Lab Results  Component Value Date   LDLCALC 73 05/22/2020   Lab Results  Component Value Date   TRIG 92 05/22/2020   Lab Results  Component Value Date   CHOLHDL 2.4 05/22/2020   Lab Results  Component Value Date   HGBA1C 5.3 03/18/2020      Assessment & Plan:   1. Hospital discharge follow-up - Miguel [name] was advised to follow up with his hospital discharge instructions  2. Alcohol abuse - Miguel [name] was encouraged on alcohol abstinence and follow up at [facility] - Miguel [name] was advised to call Crisis help line with worsening symptoms.  3. Kidney function test abnormal - Miguel [name] was encouraged to increase water intake and  - Ambulatory referral to Nephrology    Follow-up: Return in about 29 days (around 07/04/2020), or if symptoms worsen or fail to improve.    Terren Jandreau Jerold Coombe, NP

## 2020-06-05 NOTE — Patient Instructions (Signed)
Alcohol Abuse and Dependence Information, Adult Alcohol is a widely available drug. People drink alcohol in different amounts. People who drink alcohol very often and in large amounts often have problems during and after drinking. They may develop what is called an alcohol use disorder. There are two main types of alcohol use disorders:  Alcohol abuse. This is when you use alcohol too much or too often. You may use alcohol to make yourself feel happy or to reduce stress. You may have a hard time setting a limit on the amount you drink.  Alcohol dependence. This is when you use alcohol consistently for a period of time, and your body changes as a result. This can make it hard to stop drinking because you may start to feel sick or feel different when you do not use alcohol. These symptoms are known as withdrawal. How can alcohol abuse and dependence affect me? Alcohol abuse and dependence can have a negative effect on your life. Drinking too much can lead to addiction. You may feel like you need alcohol to function normally. You may drink alcohol before work in the morning, during the day, or as soon as you get home from work in the evening. These actions can result in:  Poor work performance.  Job loss.  Financial problems.  Car crashes or criminal charges from driving after drinking alcohol.  Problems in your relationships with friends and family.  Losing the trust and respect of coworkers, friends, and family. Drinking heavily over a long period of time can permanently damage your body and brain, and can cause lifelong health issues, such as:  Damage to your liver or pancreas.  Heart problems, high blood pressure, or stroke.  Certain cancers.  Decreased ability to fight infections.  Brain or nerve damage.  Depression.  Early (premature) death. If you are careless or you crave alcohol, it is easy to drink more than your body can handle (overdose). Alcohol overdose is a serious  situation that requires hospitalization. It may lead to permanent injuries or death. What can increase my risk?  Having a family history of alcohol abuse.  Having depression or other mental health conditions.  Beginning to drink at an early age.  Binge drinking often.  Experiencing trauma, stress, and an unstable home life during childhood.  Spending time with people who drink often. What actions can I take to prevent or manage alcohol abuse and dependence?  Do not drink alcohol if: ? Your health care provider tells you not to drink. ? You are pregnant, may be pregnant, or are planning to become pregnant.  If you drink alcohol: ? Limit how much you use to:  0-1 drink a day for women.  0-2 drinks a day for men. ? Be aware of how much alcohol is in your drink. In the [place] one drink equals one 12 oz bottle of beer (355 mL), one 5 oz glass of wine (148 mL), or one 1 oz glass of hard liquor (44 mL).  Stop drinking if you have been drinking too much. This can be very hard to do if you are used to abusing alcohol. If you begin to have withdrawal symptoms, talk with your health care provider or a person that you trust. These symptoms may include anxiety, shaky hands, headache, nausea, sweating, or not being able to sleep.  Choose to drink nonalcoholic beverages in social gatherings and places where there may be alcohol. Activity  Spend more time on activities that you enjoy that do   not involve alcohol, like hobbies or exercise.  Find healthy ways to cope with stress, such as exercise, meditation, or spending time with people you care about. General information  Talk to your family, coworkers, and friends about supporting you in your efforts to stop drinking. If they drink, ask them not to drink around you. Spend more time with people who do not drink alcohol.  If you think that you have an alcohol dependency problem: ? Tell friends or family about your concerns. ? Talk with your  health care provider or another health professional about where to get help. ? Work with a therapist and a chemical dependency counselor. ? Consider joining a support group for people who struggle with alcohol abuse and dependence. Where to find support   Your health care provider.  [facility]: www.smartrecovery.org Therapy and support groups  Local treatment centers or chemical dependency counselors.  Local AA groups in your community: www.aa.org Where to find more information  Centers for Disease Control and Prevention: www.cdc.gov  National Institute on Alcohol Abuse and Alcoholism: www.niaaa.nih.gov  Alcoholics Anonymous (AA): www.aa.org Contact a health care provider if:  You drank more or for longer than you intended on more than one occasion.  You tried to stop drinking or to cut back on how much you drink, but you were not able to.  You often drink to the point of vomiting or passing out.  You want to drink so badly that you cannot think about anything else.  You have problems in your life due to drinking, but you continue to drink.  You keep drinking even though you feel anxious, depressed, or have experienced memory loss.  You have stopped doing the things you used to enjoy in order to drink.  You have to drink more than you used to in order to get the effect you want.  You experience anxiety, sweating, nausea, shakiness, and trouble sleeping when you try to stop drinking. Get help right away if:  You have thoughts about hurting yourself or others.  You have serious withdrawal symptoms, including: ? Confusion. ? Racing heart. ? High blood pressure. ? Fever. If you ever feel like you may hurt yourself or others, or have thoughts about taking your own life, get help right away. You can go to your nearest emergency department or call:  Your local emergency services (911 in the [place]  A suicide crisis helpline, such as the National Suicide Prevention  Lifeline at 1-800-273-8255. This is open 24 hours a day. Summary  Alcohol abuse and dependence can have a negative effect on your life. Drinking too much or too often can lead to addiction.  If you drink alcohol, limit how much you use.  If you are having trouble keeping your drinking under control, find ways to change your behavior. Hobbies, calming activities, exercise, or support groups can help.  If you feel you need help with changing your drinking habits, talk with your health care provider, a good friend, or a therapist, or go to an AA group. This information is not intended to replace advice given to you by your health care provider. Make sure you discuss any questions you have with your health care provider. Document Revised: 02/21/2019 Document Reviewed: 01/10/2019 Elsevier Patient Education  2020 Elsevier Inc.  

## 2020-06-07 ENCOUNTER — Telehealth: Payer: Self-pay | Admitting: Pharmacist

## 2020-06-07 NOTE — Telephone Encounter (Signed)
06/07/2020 2:52:21 PM - Advair & Ventolin renewal to pt & dr  -- Elmer Picker - Friday, June 07, 2020 2:48 PM --[facility] enrollment ended 05/24/2020--for Ventolin & Advair. I had previously mailed patient 04/03/2020 his portion to sign & return of [place] form, patient has not returned, I am mailing a Second request to him today. Also sending scripts for Ventolin HFA 39mcg Inhale 2 puffs every four hours as needed & Advair 100/27mcg Inhale 1 puff two times a day, rinse mouth & spit after each use for provider@ [facility] to sign & return.

## 2020-06-08 ENCOUNTER — Emergency Department
Admission: EM | Admit: 2020-06-08 | Discharge: 2020-06-08 | Disposition: A | Discharge status: Home / Self Care | Payer: Medicaid Other | Attending: Emergency Medicine | Admitting: Emergency Medicine

## 2020-06-08 ENCOUNTER — Encounter: Payer: Self-pay | Admitting: Emergency Medicine

## 2020-06-08 ENCOUNTER — Other Ambulatory Visit: Payer: Self-pay

## 2020-06-08 DIAGNOSIS — Z7951 Long term (current) use of inhaled steroids: Secondary | ICD-10-CM | POA: Insufficient documentation

## 2020-06-08 DIAGNOSIS — F1090 Alcohol use, unspecified, uncomplicated: Secondary | ICD-10-CM

## 2020-06-08 DIAGNOSIS — Z79899 Other long term (current) drug therapy: Secondary | ICD-10-CM | POA: Insufficient documentation

## 2020-06-08 DIAGNOSIS — J45909 Unspecified asthma, uncomplicated: Secondary | ICD-10-CM | POA: Insufficient documentation

## 2020-06-08 DIAGNOSIS — Z7289 Other problems related to lifestyle: Secondary | ICD-10-CM | POA: Insufficient documentation

## 2020-06-08 DIAGNOSIS — Z789 Other specified health status: Secondary | ICD-10-CM

## 2020-06-08 DIAGNOSIS — I10 Essential (primary) hypertension: Secondary | ICD-10-CM | POA: Insufficient documentation

## 2020-06-08 DIAGNOSIS — F329 Major depressive disorder, single episode, unspecified: Secondary | ICD-10-CM | POA: Insufficient documentation

## 2020-06-08 DIAGNOSIS — F32A Depression, unspecified: Secondary | ICD-10-CM

## 2020-06-08 DIAGNOSIS — Z87891 Personal history of nicotine dependence: Secondary | ICD-10-CM | POA: Insufficient documentation

## 2020-06-08 LAB — CBC
HCT: 36.7 % — ABNORMAL LOW (ref 39.0–52.0)
Hemoglobin: 11.8 g/dL — ABNORMAL LOW (ref 13.0–17.0)
MCH: 29.6 pg (ref 26.0–34.0)
MCHC: 32.2 g/dL (ref 30.0–36.0)
MCV: 92.2 fL (ref 80.0–100.0)
Platelets: 222 10*3/uL (ref 150–400)
RBC: 3.98 MIL/uL — ABNORMAL LOW (ref 4.22–5.81)
RDW: 17.4 % — ABNORMAL HIGH (ref 11.5–15.5)
WBC: 6.8 10*3/uL (ref 4.0–10.5)
nRBC: 0 % (ref 0.0–0.2)

## 2020-06-08 NOTE — Discharge Instructions (Addendum)
Please seek medical attention and help for any thoughts about wanting to harm yourself, harm others, any concerning change in behavior, severe depression, inappropriate drug use or any other new or concerning symptoms. ° °

## 2020-06-08 NOTE — ED Notes (Addendum)
Pt left wrist cleaned w/ NS and 4x4 gauze pads. Wrist bandaged w/ non adhesive pad, wrapped in gauze.
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
## 2020-06-08 NOTE — ED Notes (Signed)
[employer] officer brought additional pt belongings to this RN:   Key fob,  Key ring w/ 2 silver keys,  2 quarters 1 nickel EMS refusal form.   Items placed in pt's belonging bag

## 2020-06-08 NOTE — ED Provider Notes (Signed)
[facility] Emergency Department Provider Note ____________________________________________   I have reviewed the triage vital signs and the nursing notes.   HISTORY  Chief Complaint IVC   History limited by: Not Limited   HPI Miguel [name] is a 59 y.o. male who presents to the emergency department today brought in under IVC with police department with concern for self harm. Patient states that he did cut himself on his left arm today. This is now the 3rd time patient has been seen for self inflicted wounds to his left forearm in the two weeks. At the time of my exam the patient states that he is sad but denies any thoughts of self harm. Denies any thoughts of wanting to harm others.   Records reviewed. Per medical record review patient has a history of frequent ED visits, 2 times in the past couple of weeks with lacerations to the top of his left arm. Evaluated by psychiatry 4 days ago for similar complaints.   Past Medical History:  Diagnosis Date  . Alcohol abuse   . Anxiety   . Asthma   . GERD (gastroesophageal reflux disease)   . Gout   . Hypertension   . Kidney stone   . OCD (obsessive compulsive disorder)   . Renal colic     Patient Active Problem List   Diagnosis Date Noted  . Hospital discharge follow-up 06/05/2020  . Kidney function test abnormal 06/05/2020  . Abdominal pain 04/12/2020  . Hypertensive urgency 04/12/2020  . Alcohol withdrawal ([place]) 03/04/2020  . Right-sided chest wall pain 02/28/2020  . Alcohol abuse with alcohol-induced mood disorder ([place]) 12/02/2019  . Moderate episode of recurrent major depressive disorder ([place])   . Health care maintenance 10/26/2019  . Right knee pain 10/26/2019  . Generalized anxiety disorder 10/14/2019  . MDD (major depressive disorder), recurrent severe, without psychosis ([place]) 07/27/2019  . Social anxiety disorder 07/27/2019  . Left-sided chest wall pain 06/14/2019  . Alcoholic cirrhosis of liver  without ascites ([place]) 06/14/2019  . Alcohol abuse 06/14/2019  . AKI (acute kidney injury) ([place]) 05/27/2019  . Alcohol withdrawal syndrome without complication ([place]) 01/00/7121  . Alcoholic intoxication without complication ([place])   . Sepsis ([place]) 03/08/2019  . Breast lump or mass 10/04/2018  . Sepsis secondary to UTI ([place]) 09/14/2018  . Asthma 07/07/2018  . GERD (gastroesophageal reflux disease) 07/07/2018  . OCD (obsessive compulsive disorder) 07/07/2018  . Self-inflicted laceration of left wrist ([place]) 03/10/2018  . Leg hematoma 12/25/2017  . Suicide and self-inflicted injury by cutting and piercing instrument ([place]) 03/10/2017  . Severe recurrent major 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 ([place]) 03/09/2017  . Substance induced mood disorder ([place]) 08/15/2016  . Involuntary commitment 08/15/2016  . Alcohol use disorder, severe, dependence ([place]) 02/05/2016  . Hypertension 12/05/2015  . Tachycardia 12/05/2015  . Gout 11/13/2015  . Chronic back pain 05/02/2015    Past Surgical History:  Procedure Laterality Date  . CHOLECYSTECTOMY  2012  . COLONOSCOPY    . COLONOSCOPY WITH PROPOFOL N/A 05/28/2020   Procedure: COLONOSCOPY WITH PROPOFOL;  Surgeon: [name], MD;  Location: [facility] ENDOSCOPY;  Service: Gastroenterology;  Laterality: N/A;  . EXTRACORPOREAL SHOCK WAVE LITHOTRIPSY Left 01/12/2019   Procedure: EXTRACORPOREAL SHOCK WAVE LITHOTRIPSY (ESWL);  Surgeon: Billey Co, MD;  Location: [facility] ORS;  Service: Urology;  Laterality: Left;  . UPPER GI ENDOSCOPY      Prior to Admission medications   Medication Sig Start Date End Date Taking? Authorizing Provider  albuterol (VENTOLIN  HFA) 108 (90 Base) MCG/ACT inhaler Inhale 2 puffs into the lungs every 6 (six) hours as needed. 03/21/20   Clapacs, Madie Reno, MD  allopurinol (ZYLOPRIM) 300 MG tablet Take 1 tablet (300 mg total) by mouth daily. 04/23/20   Iloabachie, Chioma E, NP  ascorbic acid (VITAMIN C) 500 MG tablet Take by mouth. 08/01/19    [provider]  chlordiazePOXIDE (LIBRIUM) 25 MG capsule Take 1 capsule (25 mg total) by mouth daily for 2 days. 06/06/20 06/08/20  Patrecia Pour, NP  citalopram (CELEXA) 20 MG tablet Take 1 tablet (20 mg total) by mouth daily. 03/21/20 03/21/21  Clapacs, Madie Reno, MD  cloNIDine (CATAPRES) 0.1 MG tablet Take 0.1 mg by mouth daily.    [provider]  colchicine 0.6 MG tablet Take by mouth. 08/01/19   [provider]  Fluticasone-Salmeterol (ADVAIR DISKUS) 100-50 MCG/DOSE AEPB Inhale 1 puff into the lungs 2 (two) times daily. 03/21/20   Clapacs, Madie Reno, MD  folic acid (FOLVITE) 1 MG tablet Take 1 mg by mouth daily.  08/01/19   [provider]  lisinopril (ZESTRIL) 40 MG tablet Take 1 tablet (40 mg total) by mouth daily. 04/23/20   Iloabachie, Chioma E, NP  omeprazole (PRILOSEC) 10 MG capsule Take 1 capsule (10 mg total) by mouth daily. 04/21/20   Cuthriell, Charline Bills, PA-C  ondansetron (ZOFRAN-ODT) 4 MG disintegrating tablet Take 1 tablet (4 mg total) by mouth every 8 (eight) hours as needed for nausea or vomiting. 04/21/20   Cuthriell, Charline Bills, PA-C  polyethylene glycol-electrolytes (NULYTELY) 420 g solution Drink one 8 oz glass every 20 mins until the entire container is finished starting at 5:00pm, today 05/27/20   Lucilla Lame, MD  thiamine 100 MG tablet Take 1 tablet (100 mg total) by mouth daily. 03/22/20   Clapacs, Madie Reno, MD    Allergies Percocet [oxycodone-acetaminophen]  Family History  Problem Relation Age of Onset  . Alcohol abuse Father   . Breast cancer Mother 31    Social History Social History   Tobacco Use  . Smoking status: Former Smoker    Types: Cigarettes  . Smokeless tobacco: Never Used  . Tobacco comment: quit 30 years ago  Vaping Use  . Vaping Use: Never used  Substance Use Topics  . Alcohol use: Not Currently    Alcohol/week: 12.0 standard drinks    Types: 12 Shots of liquor per week    Comment: pint daily  . Drug use: No     Review of Systems Constitutional: No fever/chills Eyes: No visual changes. ENT: No sore throat. Cardiovascular: Denies chest pain. Respiratory: Denies shortness of breath. Gastrointestinal: No abdominal pain.  No nausea, no vomiting.  No diarrhea.   Genitourinary: Negative for dysuria. Musculoskeletal: Negative for back pain. Skin: Positive for cut to left forearm.  Neurological: Negative for headaches, focal weakness or numbness.  ____________________________________________   PHYSICAL EXAM:  VITAL SIGNS: ED Triage Vitals  Enc Vitals Group     BP 06/08/20 1439 100/66     Pulse Rate 06/08/20 1439 95     Resp 06/08/20 1439 16     Temp 06/08/20 1439 98.8 F (37.1 C)     Temp Source 06/08/20 1439 Oral     SpO2 06/08/20 1439 98 %     Weight 06/08/20 1438 189 lb 14.4 oz (86.1 kg)     Height 06/08/20 1438 6' (1.829 m)     Head Circumference --      Peak Flow --  Pain Score 06/08/20 1438 0   Constitutional: Alert and oriented.  Eyes: Conjunctivae are normal.  ENT      Head: Normocephalic and atraumatic.      Nose: No congestion/rhinnorhea.      Mouth/Throat: Mucous membranes are moist.      Neck: No stridor. Hematological/Lymphatic/Immunilogical: No cervical lymphadenopathy. Cardiovascular: Normal rate, regular rhythm.  No murmurs, rubs, or gallops. Respiratory: Normal respiratory effort without tachypnea nor retractions. Breath sounds are clear and equal bilaterally. No wheezes/rales/rhonchi. Gastrointestinal: Soft and non tender. No rebound. No guarding.  Genitourinary: Deferred Musculoskeletal: Normal range of motion in all extremities. No lower extremity edema. Neurologic:  Normal speech and language. No gross focal neurologic deficits are appreciated.  Skin:  Acute superficial laceration to the dorsal distal surface of his left forearm. Healing lacerations to dorsal surface of left forearm also noted.  Psychiatric: Depressed. Denies  SI.  ____________________________________________    LABS (pertinent positives/negatives)  CBC wbc 6.8, hgb 11.8, plt 222  ____________________________________________   EKG  None  ____________________________________________    RADIOLOGY  None  ____________________________________________   PROCEDURES  Procedures  ____________________________________________   INITIAL IMPRESSION / ASSESSMENT AND PLAN / ED COURSE  Pertinent labs & imaging results that were available during my care of the patient were reviewed by me and considered in my medical decision making (see chart for details).   Patient presented under IVC after cutting himself in the left forearm. On exam patient with superficial laceration to the left forearm. At the time of my exam patient is awake, alert and oriented. States he still feels sad but denies any desire for self harm or to harm others. Would like to go home to be with his wife. Patient has frequent ER visits with similar complaints. Has been recently evaluated by psychiatry. At this time given lack of SI and that he presents in his typical state of health do think it is reasonable to discharge home. Will give outpatient resources.  ____________________________________________   FINAL CLINICAL IMPRESSION(S) / ED DIAGNOSES  Final diagnoses:  Depression, unspecified depression type  Alcohol use     Note: This dictation was prepared with Dragon dictation. Any transcriptional errors that result from this process are unintentional     Nance Pear, MD 06/08/20 1527

## 2020-06-08 NOTE — ED Notes (Signed)
Topaz signature collector not working at bedside. Pt did not have any questions reference follow up care. Pt stated he had completed "his paperwork" for [facility].

## 2020-06-08 NOTE — ED Notes (Signed)
Pt asked this RN for Ativan. Reminded pt that EDP had already prescribed Librium that he could take at home.
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
## 2020-06-08 NOTE — ED Notes (Signed)
Pt DC to lobby, pt provided w/ phone to call ride. Alerted 1st nurse pt had been DC and was using phone for ride.

## 2020-06-10 ENCOUNTER — Encounter: Payer: Self-pay | Admitting: Emergency Medicine

## 2020-06-10 ENCOUNTER — Emergency Department
Admission: EM | Admit: 2020-06-10 | Discharge: 2020-06-10 | Disposition: A | Discharge status: Home / Self Care | Payer: Self-pay | Attending: Emergency Medicine | Admitting: Emergency Medicine

## 2020-06-10 ENCOUNTER — Emergency Department: Payer: Self-pay

## 2020-06-10 ENCOUNTER — Other Ambulatory Visit: Payer: Self-pay

## 2020-06-10 DIAGNOSIS — E119 Type 2 diabetes mellitus without complications: Secondary | ICD-10-CM | POA: Insufficient documentation

## 2020-06-10 DIAGNOSIS — F1010 Alcohol abuse, uncomplicated: Secondary | ICD-10-CM | POA: Insufficient documentation

## 2020-06-10 DIAGNOSIS — J069 Acute upper respiratory infection, unspecified: Secondary | ICD-10-CM | POA: Insufficient documentation

## 2020-06-10 DIAGNOSIS — J45909 Unspecified asthma, uncomplicated: Secondary | ICD-10-CM | POA: Insufficient documentation

## 2020-06-10 DIAGNOSIS — J029 Acute pharyngitis, unspecified: Secondary | ICD-10-CM | POA: Insufficient documentation

## 2020-06-10 DIAGNOSIS — Z20822 Contact with and (suspected) exposure to covid-19: Secondary | ICD-10-CM | POA: Insufficient documentation

## 2020-06-10 DIAGNOSIS — Z79899 Other long term (current) drug therapy: Secondary | ICD-10-CM | POA: Insufficient documentation

## 2020-06-10 DIAGNOSIS — Z87891 Personal history of nicotine dependence: Secondary | ICD-10-CM | POA: Insufficient documentation

## 2020-06-10 DIAGNOSIS — Z7951 Long term (current) use of inhaled steroids: Secondary | ICD-10-CM | POA: Insufficient documentation

## 2020-06-10 DIAGNOSIS — R05 Cough: Secondary | ICD-10-CM | POA: Insufficient documentation

## 2020-06-10 DIAGNOSIS — I10 Essential (primary) hypertension: Secondary | ICD-10-CM | POA: Insufficient documentation

## 2020-06-10 LAB — SARS CORONAVIRUS 2 BY RT PCR (HOSPITAL ORDER, PERFORMED IN ~~LOC~~ HOSPITAL LAB): SARS Coronavirus 2: NEGATIVE

## 2020-06-10 MED ORDER — AMOXICILLIN 500 MG PO CAPS: 500.0000 mg | ORAL_CAPSULE | Freq: Three times a day (TID) | ORAL | 0 refills | Status: DC

## 2020-06-10 MED ORDER — PREDNISONE 10 MG (21) PO TBPK
ORAL_TABLET | ORAL | 0 refills | Status: DC
Start: 2020-06-10 — End: 2020-06-10

## 2020-06-10 MED ORDER — PREDNISONE 10 MG (21) PO TBPK
ORAL_TABLET | ORAL | 0 refills | Status: DC
Start: 2020-06-10 — End: 2020-06-26

## 2020-06-10 NOTE — ED Triage Notes (Signed)
Pt in via POV, reports cough, congestion, sob, sore throat x 2 days.  Vitals WDL, NAD noted at this time.

## 2020-06-10 NOTE — ED Provider Notes (Signed)
[facility] Emergency Department Provider Note  ____________________________________________   First MD Initiated Contact with Patient 06/10/20 1258     (approximate)  I have reviewed the triage vital signs and the nursing notes.   HISTORY  Chief Complaint URI    HPI Miguel [name] is a 59 y.o. male presents emergency department with shortness of breath, productive cough, sore throat nasal congestion.  States he has been feeling this way for 2 to 3 days.  States he had a colonoscopy done 3 to 4 days ago and they said the device irritated his throat.  States he coughed up a plug of phlegm.  States he has had to use his inhaler much more than normal.  He is concerned he has Covid.  He denies cardiac type chest pain.  Chronic abdominal pain.    Past Medical History:  Diagnosis Date  . Alcohol abuse   . Anxiety   . Asthma   . GERD (gastroesophageal reflux disease)   . Gout   . Hypertension   . Kidney stone   . OCD (obsessive compulsive disorder)   . Renal colic     Patient Active Problem List   Diagnosis Date Noted  . Hospital discharge follow-up 06/05/2020  . Kidney function test abnormal 06/05/2020  . Abdominal pain 04/12/2020  . Hypertensive urgency 04/12/2020  . Alcohol withdrawal ([place]) 03/04/2020  . Right-sided chest wall pain 02/28/2020  . Alcohol abuse with alcohol-induced mood disorder ([place]) 12/02/2019  . Moderate episode of recurrent major depressive disorder ([place])   . Health care maintenance 10/26/2019  . Right knee pain 10/26/2019  . Generalized anxiety disorder 10/14/2019  . MDD (major depressive disorder), recurrent severe, without psychosis ([place]) 07/27/2019  . Social anxiety disorder 07/27/2019  . Left-sided chest wall pain 06/14/2019  . Alcoholic cirrhosis of liver without ascites ([place]) 06/14/2019  . Alcohol abuse 06/14/2019  . AKI (acute kidney injury) ([place]) 05/27/2019  . Alcohol withdrawal syndrome without complication ([place])  37/85/8850  . Alcoholic intoxication without complication ([place])   . Sepsis ([place]) 03/08/2019  . Breast lump or mass 10/04/2018  . Sepsis secondary to UTI ([place]) 09/14/2018  . Asthma 07/07/2018  . GERD (gastroesophageal reflux disease) 07/07/2018  . OCD (obsessive compulsive disorder) 07/07/2018  . Self-inflicted laceration of left wrist ([place]) 03/10/2018  . Leg hematoma 12/25/2017  . Suicide and self-inflicted injury by cutting and piercing instrument ([place]) 03/10/2017  . Severe recurrent major 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 ([place]) 03/09/2017  . Substance induced mood disorder ([place]) 08/15/2016  . Involuntary commitment 08/15/2016  . Alcohol use disorder, severe, dependence ([place]) 02/05/2016  . Hypertension 12/05/2015  . Tachycardia 12/05/2015  . Gout 11/13/2015  . Chronic back pain 05/02/2015    Past Surgical History:  Procedure Laterality Date  . CHOLECYSTECTOMY  2012  . COLONOSCOPY    . COLONOSCOPY WITH PROPOFOL N/A 05/28/2020   Procedure: COLONOSCOPY WITH PROPOFOL;  Surgeon: [name], MD;  Location: [facility] ENDOSCOPY;  Service: Gastroenterology;  Laterality: N/A;  . EXTRACORPOREAL SHOCK WAVE LITHOTRIPSY Left 01/12/2019   Procedure: EXTRACORPOREAL SHOCK WAVE LITHOTRIPSY (ESWL);  Surgeon: Billey Co, MD;  Location: [facility] ORS;  Service: Urology;  Laterality: Left;  . UPPER GI ENDOSCOPY      Prior to Admission medications   Medication Sig Start Date End Date Taking? Authorizing Provider  albuterol (VENTOLIN HFA) 108 (90 Base) MCG/ACT inhaler Inhale 2 puffs into the lungs every 6 (six) hours as needed. 03/21/20   Clapacs, Madie Reno, MD  allopurinol (ZYLOPRIM) 300 MG tablet Take 1 tablet (300 mg total) by mouth daily. 04/23/20   Iloabachie, Chioma E, NP  amoxicillin (AMOXIL) 500 MG capsule Take 1 capsule (500 mg total) by mouth 3 (three) times daily. 06/10/20   Fisher, Linden Dolin, PA-C  ascorbic acid (VITAMIN C) 500 MG tablet Take by mouth. 08/01/19   [provider]  citalopram  (CELEXA) 20 MG tablet Take 1 tablet (20 mg total) by mouth daily. 03/21/20 03/21/21  Clapacs, Madie Reno, MD  cloNIDine (CATAPRES) 0.1 MG tablet Take 0.1 mg by mouth daily.    [provider]  colchicine 0.6 MG tablet Take by mouth. 08/01/19   [provider]  Fluticasone-Salmeterol (ADVAIR DISKUS) 100-50 MCG/DOSE AEPB Inhale 1 puff into the lungs 2 (two) times daily. 03/21/20   Clapacs, Madie Reno, MD  folic acid (FOLVITE) 1 MG tablet Take 1 mg by mouth daily.  08/01/19   [provider]  lisinopril (ZESTRIL) 40 MG tablet Take 1 tablet (40 mg total) by mouth daily. 04/23/20   Iloabachie, Chioma E, NP  omeprazole (PRILOSEC) 10 MG capsule Take 1 capsule (10 mg total) by mouth daily. 04/21/20   Cuthriell, Charline Bills, PA-C  ondansetron (ZOFRAN-ODT) 4 MG disintegrating tablet Take 1 tablet (4 mg total) by mouth every 8 (eight) hours as needed for nausea or vomiting. 04/21/20   Cuthriell, Charline Bills, PA-C  polyethylene glycol-electrolytes (NULYTELY) 420 g solution Drink one 8 oz glass every 20 mins until the entire container is finished starting at 5:00pm, today 05/27/20   Lucilla Lame, MD  predniSONE (STERAPRED UNI-PAK 21 TAB) 10 MG (21) TBPK tablet Take 6 pills on day one then decrease by 1 pill each day 06/10/20   Versie Starks, PA-C  thiamine 100 MG tablet Take 1 tablet (100 mg total) by mouth daily. 03/22/20   Clapacs, Madie Reno, MD    Allergies Percocet [oxycodone-acetaminophen]  Family History  Problem Relation Age of Onset  . Alcohol abuse Father   . Breast cancer Mother 32    Social History Social History   Tobacco Use  . Smoking status: Former Smoker    Types: Cigarettes  . Smokeless tobacco: Never Used  . Tobacco comment: quit 30 years ago  Vaping Use  . Vaping Use: Never used  Substance Use Topics  . Alcohol use: Yes    Alcohol/week: 12.0 standard drinks    Types: 12 Shots of liquor per week  . Drug use: No    Review of Systems  Constitutional: No fever/chills Eyes:  No visual changes. ENT: No sore throat. Respiratory: Positive cough and shortness of breath Cardiovascular: Denies chest pain Gastrointestinal: Denies abdominal pain Genitourinary: Negative for dysuria. Musculoskeletal: Negative for back pain. Skin: Negative for rash. Psychiatric: no mood changes,     ____________________________________________   PHYSICAL EXAM:  VITAL SIGNS: ED Triage Vitals  Enc Vitals Group     BP 06/10/20 1209 123/75     Pulse Rate 06/10/20 1209 85     Resp 06/10/20 1209 18     Temp 06/10/20 1209 98.9 F (37.2 C)     Temp Source 06/10/20 1209 Oral     SpO2 06/10/20 1209 98 %     Weight 06/10/20 1217 175 lb (79.4 kg)     Height 06/10/20 1217 6' (1.829 m)     Head Circumference --      Peak Flow --      Pain Score 06/10/20 1216 0     Pain Loc --  Pain Edu? --      Excl. in [place]? --     Constitutional: Alert and oriented. Well appearing and in no acute distress. Eyes: Conjunctivae are normal.  Head: Atraumatic. Nose: No congestion/rhinnorhea. Mouth/Throat: Mucous membranes are moist.   Neck:  supple no lymphadenopathy noted Cardiovascular: Normal rate, regular rhythm. Heart sounds are normal Respiratory: Normal respiratory effort.  No retractions, lungs with expiratory wheeze noted Abd: soft nontender bs normal all 4 quad GU: deferred Musculoskeletal: FROM all extremities, warm and well perfused Neurologic:  Normal speech and language.  Skin:  Skin is warm, dry and intact. No rash noted. Psychiatric: Mood and affect are normal. Speech and behavior are normal.  ____________________________________________   LABS (all labs ordered are listed, but only abnormal results are displayed)  Labs Reviewed  SARS CORONAVIRUS 2 BY RT PCR (HOSPITAL ORDER, [place] [facility])   ____________________________________________   ____________________________________________  RADIOLOGY  Chest x-ray   ____________________________________________   PROCEDURES  Procedure(s) performed: No  Procedures    ____________________________________________   INITIAL IMPRESSION / ASSESSMENT AND PLAN / ED COURSE  Pertinent labs & imaging results that were available during my care of the patient were reviewed by me and considered in my medical decision making (see chart for details).   Patient 59 year old male with cough and URI symptoms.  Concerns for Covid.  See HPI  Physical exam shows patient to appear well.  Vitals are stable.  Lungs with expiratory wheezes noted.  Remainder exams are unremarkable  Covid swab and chest x-ray ordered  Covid and chest x-ray are both negative.  Feel that with the patient's vitals being normal he is stable to be discharged.  He was given a prescription for amoxicillin and Sterapred.  He is to follow-up with his regular doctor if not improving in 3 days.  Return emergency department if worsening.  He states he understands.  He was discharged stable condition.   As part of my medical decision making, I reviewed the following data within the [place] notes reviewed and incorporated, Labs reviewed , Old chart reviewed, Radiograph reviewed , Notes from prior ED visits and [place] Controlled Substance Database  ____________________________________________   FINAL CLINICAL IMPRESSION(S) / ED DIAGNOSES  Final diagnoses:  Viral URI with cough      NEW MEDICATIONS STARTED DURING THIS VISIT:  Discharge Medication List as of 06/10/2020  2:44 PM       Note:  This document was prepared using Dragon voice recognition software and may include unintentional dictation errors.    Versie Starks, PA-C 06/10/20 1708    Blake Divine, MD 06/11/20 1310

## 2020-06-10 NOTE — Discharge Instructions (Addendum)
Your chest x-ray does not show pneumonia and your Covid test is negative. Take the antibiotic and steroid as prescribed.  Continue to use your inhaler and regular medications. Return to emergency department worsening.

## 2020-06-10 NOTE — ED Notes (Signed)
Pt present to the ED for SOB, productive cough, sore throat, and nasal congestion. Pt states that he has been feeling this way for the past 2 days. Denies fever and CP. Pt states he has been coughing up clear and yellowish mucus. Pt also c/o sore throat and that it hurts to swallow. Pt is A&Ox4 and NAD at this time.

## 2020-06-11 ENCOUNTER — Emergency Department: Payer: Medicaid Other

## 2020-06-11 DIAGNOSIS — Y999 Unspecified external cause status: Secondary | ICD-10-CM | POA: Insufficient documentation

## 2020-06-11 DIAGNOSIS — Z79899 Other long term (current) drug therapy: Secondary | ICD-10-CM | POA: Insufficient documentation

## 2020-06-11 DIAGNOSIS — Y929 Unspecified place or not applicable: Secondary | ICD-10-CM | POA: Insufficient documentation

## 2020-06-11 DIAGNOSIS — Y939 Activity, unspecified: Secondary | ICD-10-CM | POA: Insufficient documentation

## 2020-06-11 DIAGNOSIS — Z7951 Long term (current) use of inhaled steroids: Secondary | ICD-10-CM | POA: Insufficient documentation

## 2020-06-11 DIAGNOSIS — W19XXXA Unspecified fall, initial encounter: Secondary | ICD-10-CM | POA: Insufficient documentation

## 2020-06-11 DIAGNOSIS — S82841A Displaced bimalleolar fracture of right lower leg, initial encounter for closed fracture: Secondary | ICD-10-CM | POA: Insufficient documentation

## 2020-06-11 DIAGNOSIS — Z87891 Personal history of nicotine dependence: Secondary | ICD-10-CM | POA: Insufficient documentation

## 2020-06-11 DIAGNOSIS — I10 Essential (primary) hypertension: Secondary | ICD-10-CM | POA: Insufficient documentation

## 2020-06-11 DIAGNOSIS — J45909 Unspecified asthma, uncomplicated: Secondary | ICD-10-CM | POA: Insufficient documentation

## 2020-06-11 LAB — BASIC METABOLIC PANEL
Anion gap: 12 (ref 5–15)
BUN: 19 mg/dL (ref 6–20)
CO2: 14 mmol/L — ABNORMAL LOW (ref 22–32)
Calcium: 8.8 mg/dL — ABNORMAL LOW (ref 8.9–10.3)
Chloride: 109 mmol/L (ref 98–111)
Creatinine, Ser: 1.98 mg/dL — ABNORMAL HIGH (ref 0.61–1.24)
GFR calc Af Amer: 41 mL/min — ABNORMAL LOW (ref >60)
GFR calc non Af Amer: 36 mL/min — ABNORMAL LOW (ref >60)
Glucose, Bld: 110 mg/dL — ABNORMAL HIGH (ref 70–99)
Potassium: 3.6 mmol/L (ref 3.5–5.1)
Sodium: 135 mmol/L (ref 135–145)

## 2020-06-11 LAB — CBC
HCT: 33.9 % — ABNORMAL LOW (ref 39.0–52.0)
Hemoglobin: 10.7 g/dL — ABNORMAL LOW (ref 13.0–17.0)
MCH: 29.6 pg (ref 26.0–34.0)
MCHC: 31.6 g/dL (ref 30.0–36.0)
MCV: 93.6 fL (ref 80.0–100.0)
Platelets: 239 10*3/uL (ref 150–400)
RBC: 3.62 MIL/uL — ABNORMAL LOW (ref 4.22–5.81)
RDW: 17.5 % — ABNORMAL HIGH (ref 11.5–15.5)
WBC: 5.9 10*3/uL (ref 4.0–10.5)
nRBC: 0 % (ref 0.0–0.2)

## 2020-06-11 LAB — TROPONIN I (HIGH SENSITIVITY): Troponin I (High Sensitivity): 3 ng/L (ref <18)

## 2020-06-11 LAB — ETHANOL: Alcohol, Ethyl (B): 129 mg/dL — ABNORMAL HIGH (ref <10)

## 2020-06-11 MED ORDER — SODIUM CHLORIDE 0.9 % IV SOLN: Freq: Once | INTRAVENOUS | Status: AC

## 2020-06-11 NOTE — ED Triage Notes (Signed)
See downtime form for triage. 

## 2020-06-11 NOTE — ED Triage Notes (Signed)
First nurse note- here for fall.  Right ankle pain.  VSS with EMS

## 2020-06-12 ENCOUNTER — Emergency Department
Admission: EM | Admit: 2020-06-12 | Discharge: 2020-06-12 | Disposition: A | Discharge status: Home / Self Care | Payer: Medicaid Other | Attending: Emergency Medicine | Admitting: Emergency Medicine

## 2020-06-12 ENCOUNTER — Other Ambulatory Visit: Payer: Self-pay | Admitting: Internal Medicine

## 2020-06-12 DIAGNOSIS — S82841A Displaced bimalleolar fracture of right lower leg, initial encounter for closed fracture: Secondary | ICD-10-CM

## 2020-06-12 MED ORDER — LORAZEPAM 2 MG/ML IJ SOLN
1.0000 mg | Freq: Once | INTRAMUSCULAR | Status: AC
  Administered 2020-06-12: 1 mg via INTRAVENOUS
  Filled 2020-06-12: qty 1

## 2020-06-12 NOTE — ED Provider Notes (Signed)
[facility] Emergency Department Provider Note  ____________________________________________   First MD Initiated Contact with Patient 06/12/20 0030     (approximate)  I have reviewed the triage vital signs and the nursing notes.   HISTORY  Chief Complaint Fall    HPI Miguel [name] is a 59 y.o. male with below list of previous medical conditions presents to the emergency department following accidental fall earlier this evening with resultant right ankle pain and swelling.  Patient denies any head injury no loss of consciousness.  Patient states that current pain score is 10 out of 10.  Patient states that pain is worse with any movement of the right ankle.        Past Medical History:  Diagnosis Date  . Alcohol abuse   . Anxiety   . Asthma   . GERD (gastroesophageal reflux disease)   . Gout   . Hypertension   . Kidney stone   . OCD (obsessive compulsive disorder)   . Renal colic     Patient Active Problem List   Diagnosis Date Noted  . Hospital discharge follow-up 06/05/2020  . Kidney function test abnormal 06/05/2020  . Abdominal pain 04/12/2020  . Hypertensive urgency 04/12/2020  . Alcohol withdrawal ([place]) 03/04/2020  . Right-sided chest wall pain 02/28/2020  . Alcohol abuse with alcohol-induced mood disorder ([place]) 12/02/2019  . Moderate episode of recurrent major depressive disorder ([place])   . Health care maintenance 10/26/2019  . Right knee pain 10/26/2019  . Generalized anxiety disorder 10/14/2019  . MDD (major depressive disorder), recurrent severe, without psychosis ([place]) 07/27/2019  . Social anxiety disorder 07/27/2019  . Left-sided chest wall pain 06/14/2019  . Alcoholic cirrhosis of liver without ascites ([place]) 06/14/2019  . Alcohol abuse 06/14/2019  . AKI (acute kidney injury) ([place]) 05/27/2019  . Alcohol withdrawal syndrome without complication ([place]) 63/11/6008  . Alcoholic intoxication without complication ([place])   . Sepsis  ([place]) 03/08/2019  . Breast lump or mass 10/04/2018  . Sepsis secondary to UTI ([place]) 09/14/2018  . Asthma 07/07/2018  . GERD (gastroesophageal reflux disease) 07/07/2018  . OCD (obsessive compulsive disorder) 07/07/2018  . Self-inflicted laceration of left wrist ([place]) 03/10/2018  . Leg hematoma 12/25/2017  . Suicide and self-inflicted injury by cutting and piercing instrument ([place]) 03/10/2017  . Severe recurrent major 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 ([place]) 03/09/2017  . Substance induced mood disorder ([place]) 08/15/2016  . Involuntary commitment 08/15/2016  . Alcohol use disorder, severe, dependence ([place]) 02/05/2016  . Hypertension 12/05/2015  . Tachycardia 12/05/2015  . Gout 11/13/2015  . Chronic back pain 05/02/2015    Past Surgical History:  Procedure Laterality Date  . CHOLECYSTECTOMY  2012  . COLONOSCOPY    . COLONOSCOPY WITH PROPOFOL N/A 05/28/2020   Procedure: COLONOSCOPY WITH PROPOFOL;  Surgeon: [name], MD;  Location: [facility] ENDOSCOPY;  Service: Gastroenterology;  Laterality: N/A;  . EXTRACORPOREAL SHOCK WAVE LITHOTRIPSY Left 01/12/2019   Procedure: EXTRACORPOREAL SHOCK WAVE LITHOTRIPSY (ESWL);  Surgeon: Billey Co, MD;  Location: [facility] ORS;  Service: Urology;  Laterality: Left;  . UPPER GI ENDOSCOPY      Prior to Admission medications   Medication Sig Start Date End Date Taking? Authorizing Provider  albuterol (VENTOLIN HFA) 108 (90 Base) MCG/ACT inhaler Inhale 2 puffs into the lungs every 6 (six) hours as needed. 03/21/20   Clapacs, Madie Reno, MD  allopurinol (ZYLOPRIM) 300 MG tablet Take 1 tablet (300 mg total) by mouth daily. 04/23/20   Iloabachie, Lonny Prude, NP  amoxicillin (AMOXIL) 500 MG capsule Take 1 capsule (500 mg total) by mouth 3 (three) times daily. 06/10/20   Fisher, Linden Dolin, PA-C  ascorbic acid (VITAMIN C) 500 MG tablet Take by mouth. 08/01/19   [provider]  citalopram (CELEXA) 20 MG tablet Take 1 tablet (20 mg total) by mouth daily. 03/21/20  03/21/21  Clapacs, Madie Reno, MD  cloNIDine (CATAPRES) 0.1 MG tablet Take 0.1 mg by mouth daily.    [provider]  colchicine 0.6 MG tablet Take by mouth. 08/01/19   [provider]  Fluticasone-Salmeterol (ADVAIR DISKUS) 100-50 MCG/DOSE AEPB Inhale 1 puff into the lungs 2 (two) times daily. 03/21/20   Clapacs, Madie Reno, MD  folic acid (FOLVITE) 1 MG tablet Take 1 mg by mouth daily.  08/01/19   [provider]  lisinopril (ZESTRIL) 40 MG tablet Take 1 tablet (40 mg total) by mouth daily. 04/23/20   Iloabachie, Chioma E, NP  omeprazole (PRILOSEC) 10 MG capsule Take 1 capsule (10 mg total) by mouth daily. 04/21/20   Cuthriell, Charline Bills, PA-C  ondansetron (ZOFRAN-ODT) 4 MG disintegrating tablet Take 1 tablet (4 mg total) by mouth every 8 (eight) hours as needed for nausea or vomiting. 04/21/20   Cuthriell, Charline Bills, PA-C  polyethylene glycol-electrolytes (NULYTELY) 420 g solution Drink one 8 oz glass every 20 mins until the entire container is finished starting at 5:00pm, today 05/27/20   Lucilla Lame, MD  predniSONE (STERAPRED UNI-PAK 21 TAB) 10 MG (21) TBPK tablet Take 6 pills on day one then decrease by 1 pill each day 06/10/20   Versie Starks, PA-C  thiamine 100 MG tablet Take 1 tablet (100 mg total) by mouth daily. 03/22/20   Clapacs, Madie Reno, MD    Allergies Percocet [oxycodone-acetaminophen]  Family History  Problem Relation Age of Onset  . Alcohol abuse Father   . Breast cancer Mother 34    Social History Social History   Tobacco Use  . Smoking status: Former Smoker    Types: Cigarettes  . Smokeless tobacco: Never Used  . Tobacco comment: quit 30 years ago  Vaping Use  . Vaping Use: Never used  Substance Use Topics  . Alcohol use: Yes    Alcohol/week: 12.0 standard drinks    Types: 12 Shots of liquor per week  . Drug use: No    Review of Systems Constitutional: No fever/chills Eyes: No visual changes. ENT: No sore throat. Cardiovascular: Denies chest  pain. Respiratory: Denies shortness of breath. Gastrointestinal: No abdominal pain.  No nausea, no vomiting.  No diarrhea.  No constipation. Genitourinary: Negative for dysuria. Musculoskeletal: Negative for neck pain.  Negative for back pain.  Positive for right ankle pain and swelling Integumentary: Negative for rash. Neurological: Negative for headaches, focal weakness or numbness. Psychiatry: Positive for EtOH ingestion ____________________________________________   PHYSICAL EXAM:  VITAL SIGNS: ED Triage Vitals  Enc Vitals Group     BP 06/11/20 1859 (!) 71/49     Pulse Rate 06/11/20 1925 95     Resp 06/11/20 1925 20     Temp 06/11/20 1925 97.9 F (36.6 C)     Temp Source 06/11/20 1925 Oral     SpO2 06/11/20 1925 97 %     Weight --      Height --      Head Circumference --      Peak Flow --      Pain Score 06/12/20 0038 10     Pain Loc --  Pain Edu? --      Excl. in [place]? --     Constitutional: Alert and oriented.  Eyes: Conjunctivae are normal.  Head: Atraumatic. Mouth/Throat: Patient is wearing a mask. Neck: No stridor.  No meningeal signs.   Cardiovascular: Normal rate, regular rhythm. Good peripheral circulation. Grossly normal heart sounds. Respiratory: Normal respiratory effort.  No retractions. Gastrointestinal: Soft and nontender. No distention.  Musculoskeletal: Right ankle pain swelling.  Range of motion limited secondary to discomfort. Neurologic:  Normal speech and language. No gross focal neurologic deficits are appreciated.  Skin:  Skin is warm, dry and intact. Psychiatric: Mood and affect are normal. Speech and behavior are normal.  ____________________________________________   LABS (all labs ordered are listed, but only abnormal results are displayed)  Labs Reviewed  ETHANOL - Abnormal; Notable for the following components:      Result Value   Alcohol, Ethyl (B) 129 (*)    All other components within normal limits  BASIC METABOLIC PANEL -  Abnormal; Notable for the following components:   CO2 14 (*)    Glucose, Bld 110 (*)    Creatinine, Ser 1.98 (*)    Calcium 8.8 (*)    GFR calc non Af Amer 36 (*)    GFR calc Af Amer 41 (*)    All other components within normal limits  CBC - Abnormal; Notable for the following components:   RBC 3.62 (*)    Hemoglobin 10.7 (*)    HCT 33.9 (*)    RDW 17.5 (*)    All other components within normal limits  BASIC METABOLIC PANEL  CBC  URINALYSIS, COMPLETE (UACMP) WITH MICROSCOPIC  CBG MONITORING, ED  TROPONIN I (HIGH SENSITIVITY)    RADIOLOGY I, [place] Jaelen Soth, personally viewed and evaluated these images (plain radiographs) as part of my medical decision making, as well as reviewing the written report by the radiologist.  ED MD interpretation: Minimally displaced bimalleolar fractures.  Official radiology report(s): DG Ankle Complete Right  Result Date: 06/11/2020 CLINICAL DATA:  59 year old male with fall and trauma to the right ankle. EXAM: RIGHT ANKLE - COMPLETE 3+ VIEW COMPARISON:  None. FINDINGS: Mildly displaced fractures of the lateral and medial malleoli with approximately 2-3 mm lateral displacement of the distal fracture fragment. There is approximately 2 mm lateral subluxation of the ankle mortise. The bones are osteopenic. There is soft tissue swelling of the ankle. IMPRESSION: Minimally displaced bimalleolar fractures. Electronically Signed   By: Anner Crete M.D.   On: 06/11/2020 23:21     Procedures   ____________________________________________   INITIAL IMPRESSION / MDM / ASSESSMENT AND PLAN / ED COURSE  As part of my medical decision making, I reviewed the following data within the electronic MEDICAL RECORD NUMBER   59 year old male presented with above-stated history and physical exam differential diagnosis including but not limited to ankle fracture and current alcohol intoxication.  Patient's x-ray did revealed nondisplaced bimalleolar fracture.  Alcohol  level 129.  Mr. Mom for me that he continues to drink alcohol and that he is also taking Librium as well.  This presents a very difficult situation as the patient does have bimalleolar fracture on the right however I am reluctant to prescribe Mr. Carthen any narcotic pain medication considering that he is currently drinking alcohol while taking Librium.  As such Mr. Redner will be advised to follow-up with orthopedic surgery this morning.  ____________________________________________  FINAL CLINICAL IMPRESSION(S) / ED DIAGNOSES  Final diagnoses:  Closed bimalleolar fracture of  right ankle, initial encounter     MEDICATIONS GIVEN DURING THIS VISIT:  Medications  0.9 %  sodium chloride infusion ( Intravenous Stopped 06/11/20 1927)     ED Discharge Orders    None      *Please note:  Miguel [name] was evaluated in Emergency Department on 06/12/2020 for the symptoms described in the history of present illness. He was evaluated in the context of the global COVID-19 pandemic, which necessitated consideration that the patient might be at risk for infection with the SARS-CoV-2 virus that causes COVID-19. Institutional protocols and algorithms that pertain to the evaluation of patients at risk for COVID-19 are in a state of rapid change based on information released by regulatory bodies including the CDC and federal and state organizations. These policies and algorithms were followed during the patient's care in the ED.  Some ED evaluations and interventions may be delayed as a result of limited staffing during and after the pandemic.*  Note:  This document was prepared using Dragon voice recognition software and may include unintentional dictation errors.   Gregor Hams, MD 06/12/20 [phone]

## 2020-06-19 ENCOUNTER — Telehealth: Payer: Self-pay | Admitting: Pharmacist

## 2020-06-19 NOTE — Telephone Encounter (Signed)
06/19/2020 10:16:49 AM - Advair & Ventolin GSK renewal  -- Miguel [name] - Wednesday, June 19, 2020 10:15 AM --Faxed [employer] renewal application for Ventolin HFA 105mcg Inhale 2 puffs every four hours as needed & Advair 100/56mcg Inhale 1 puff two times a day, rinse mouth and spit after each use.

## 2020-06-24 ENCOUNTER — Other Ambulatory Visit: Payer: Self-pay

## 2020-06-24 ENCOUNTER — Encounter: Payer: Self-pay | Admitting: Licensed Clinical Social Worker

## 2020-06-24 ENCOUNTER — Inpatient Hospital Stay: Payer: Self-pay | Attending: Oncology | Admitting: Licensed Clinical Social Worker

## 2020-06-24 DIAGNOSIS — Z860100 Personal history of colon polyps, unspecified: Secondary | ICD-10-CM | POA: Insufficient documentation

## 2020-06-24 DIAGNOSIS — Z803 Family history of malignant neoplasm of breast: Secondary | ICD-10-CM | POA: Insufficient documentation

## 2020-06-24 NOTE — Progress Notes (Signed)
REFERRING PROVIDER: [name], MD [address],  [place] [address]  PRIMARY PROVIDER:  Langston Reusing, NP  PRIMARY REASON FOR VISIT:  1. Family history of breast cancer   2. Hx of colonic polyps    I connected with Miguel [name] on 06/24/2020 at 12:45 PM EDT by MyChart video conference and verified that I am speaking with the correct person using two identifiers.    Patient location: home Provider location: [place]:   Miguel [name], a 59 y.o. male, was seen for a [facility] cancer genetics consultation at the request of Dr. Vicente Males due to a personal history of colon polyps.  Miguel [name] presents to clinic today to discuss the possibility of a hereditary predisposition to cancer, genetic testing, and to further clarify his future cancer risks, as well as potential cancer risks for family members.    Miguel [name] is a 59 y.o. male with no personal history of cancer. He has had 2 colonoscopies. On the first, 3 polyps were removed. On the second, in 2021, 9 polyps were removed and they were a mix of tubular adenomas, hyperplastic polyps and sessile serrated polyps.  CANCER HISTORY:  Oncology History   No history exists.    Past Medical History:  Diagnosis Date  . Alcohol abuse   . Anxiety   . Asthma   . Family history of breast cancer   . GERD (gastroesophageal reflux disease)   . Gout   . Hx of colonic polyps   . Hypertension   . Kidney stone   . OCD (obsessive compulsive disorder)   . Renal colic     Past Surgical History:  Procedure Laterality Date  . CHOLECYSTECTOMY  2012  . COLONOSCOPY    . COLONOSCOPY WITH PROPOFOL N/A 05/28/2020   Procedure: COLONOSCOPY WITH PROPOFOL;  Surgeon: [name], MD;  Location: [facility] ENDOSCOPY;  Service: Gastroenterology;  Laterality: N/A;  . EXTRACORPOREAL SHOCK WAVE LITHOTRIPSY Left 01/12/2019   Procedure: EXTRACORPOREAL SHOCK WAVE LITHOTRIPSY (ESWL);  Surgeon: Billey Co, MD;   Location: [facility] ORS;  Service: Urology;  Laterality: Left;  . UPPER GI ENDOSCOPY      Social History   Socioeconomic History  . Marital status: Married    Spouse name: Not on file  . Number of children: Not on file  . Years of education: Not on file  . Highest education level: Not on file  Occupational History  . Not on file  Tobacco Use  . Smoking status: Former Smoker    Types: Cigarettes  . Smokeless tobacco: Never Used  . Tobacco comment: quit 30 years ago  Vaping Use  . Vaping Use: Never used  Substance and Sexual Activity  . Alcohol use: Yes    Alcohol/week: 12.0 standard drinks    Types: 12 Shots of liquor per week  . Drug use: No  . Sexual activity: Not on file  Other Topics Concern  . Not on file  Social History Narrative   Lives at home with his wife and takes care of his wife. Independent at baseline      - Biggest strain is financial but doesn't think they could got get more help.      Patient expressed interest in possible financial assistance. Informed written consent obtained   Social Determinants of Health   Financial Resource Strain:   . Difficulty of Paying Living Expenses:   Food Insecurity:   . Worried About  Running Out of Food in the Last Year:   . [place] in the Last Year:   Transportation Needs:   . Lack of Transportation (Medical):   Marland Kitchen Lack of Transportation (Non-Medical):   Physical Activity:   . Days of Exercise per Week:   . Minutes of Exercise per Session:   Stress:   . Feeling of Stress :   Social Connections:   . Frequency of Communication with Friends and Family:   . Frequency of Social Gatherings with Friends and Family:   . Attends Religious Services:   . Active Member of Clubs or Organizations:   . Attends Archivist Meetings:   Marland Kitchen Marital Status:      FAMILY HISTORY:  We obtained a detailed, 4-generation family history.  Significant diagnoses are listed below: Family History  Problem Relation Age of  Onset  . Alcohol abuse Father   . Breast cancer Mother 33   Miguel [name] does not have children. He has 2 brothers, he does not believe they have had colonoscopies.   Miguel [name] mother is living at 62 with a history of breast cancer at 56, he reports she is in great health. Patient is unaware of cancers or colon polyps in maternal aunts/uncles/cousins/grandparents.  Miguel [name] father recently passed at 24. He did not have cancer or colon polyps that patient is aware of. Patient is unaware of cancer or polyps in paternal aunts/uncles/cousins/grandparents.  Miguel [name] is unaware of previous family history of genetic testing for hereditary cancer risks. Patient's maternal ancestors are of unknown descent, and paternal ancestors are of unknown descent. There is no reported Ashkenazi Jewish ancestry. There is no known consanguinity.  GENETIC COUNSELING ASSESSMENT: Miguel [name] is a 59 y.o. male with a personal history of polyps which is somewhat suggestive of a hereditary cancer syndrome/hereditary polyposis syndrome and predisposition to cancer. We, therefore, discussed and recommended the following at today's visit.   DISCUSSION: We discussed that polyps in general are common, however, most people have fewer than 5 lifetime polyps.  When an individual has 10 or more polyps we become concerned about an underlying polyposis syndrome.  The most common hereditary polyposis syndromes are caused by problems in the APC and MUTYH genes, however, more recently, mutations in the [place] and MSH3 genes have been identified in some polyposis families. We discussed that testing is beneficial for several reasons including knowing how to follow individuals for cancer screenings, and understand if other family members could be at risk for cancer and allow them to undergo genetic testing.   We reviewed the characteristics, features and inheritance patterns of hereditary cancer syndromes. We also discussed genetic testing,  including the appropriate family members to test, the process of testing, insurance coverage and turn-around-time for results. We discussed the implications of a negative, positive and/or variant of uncertain significant result. We recommended Miguel [name] pursue genetic testing for the [facility] Multi-Cancer gene panel.   The Multi-Cancer Panel offered by [facility] includes sequencing and/or deletion duplication testing of the following 85 genes: AIP, ALK, APC, ATM, AXIN2,BAP1,  BARD1, BLM, BMPR1A, BRCA1, BRCA2, BRIP1, CASR, CDC73, CDH1, CDK4, CDKN1B, CDKN1C, CDKN2A (p14ARF), CDKN2A (p16INK4a), CEBPA, CHEK2, CTNNA1, DICER1, DIS3L2, EGFR (c.2369C>T, p.Thr790Met variant only), EPCAM (Deletion/duplication testing only), FH, FLCN, GATA2, GPC3, GREM1 (Promoter region deletion/duplication testing only), HOXB13 (c.251G>A, p.Gly84Glu), HRAS, KIT, MAX, MEN1, MET, MITF (c.952G>A, p.Glu318Lys variant only), MLH1, MSH2, MSH3, MSH6, MUTYH, NBN, NF1, NF2, NTHL1, PALB2, PDGFRA, PHOX2B, PMS2, POLD1, POLE, POT1, PRKAR1A, PTCH1,  PTEN, RAD50, RAD51C, RAD51D, RB1, RECQL4, RET, RNF43, RUNX1, SDHAF2, SDHA (sequence changes only), SDHB, SDHC, SDHD, SMAD4, SMARCA4, SMARCB1, SMARCE1, STK11, SUFU, TERC, TERT, TMEM127, TP53, TSC1, TSC2, VHL, WRN and WT1.   Based on Miguel [name]'s personal history of polyps, he meets medical criteria for genetic testing. He will fill out the Patient Assistance Program form when he comes in for blood draw on 8/12.  PLAN: After considering the risks, benefits, and limitations, Miguel [name] provided informed consent to pursue genetic testing and the blood sample was sent to [facility] for analysis of the [facility] Multi-Cancer Panel. Results should be available within approximately 2-3 weeks' time, at which point they will be disclosed by telephone to Miguel [name], as will any additional recommendations warranted by these results. Miguel [name] will receive a summary of his genetic counseling visit and a copy of his  results once available. This information will also be available in Epic.   Miguel [name] questions were answered to his satisfaction today. Our contact information was provided should additional questions or concerns arise. Thank you for the referral and allowing [name] to share in the care of your patient.   Faith Rogue, MS, [facility] Genetic Counselor [email or web address] Phone: [phone]  The patient was seen for a total of 40 minutes in face-to-face genetic counseling.  Dr. Grayland Ormond was available for discussion regarding this case.   _______________________________________________________________________ For Office Staff:  Number of people involved in session: 1 Was an Intern/ student involved with case: no

## 2020-06-26 ENCOUNTER — Ambulatory Visit: Payer: Medicaid Other | Admitting: Gerontology

## 2020-06-26 ENCOUNTER — Other Ambulatory Visit: Payer: Self-pay

## 2020-06-26 VITALS — BP 104/72 | HR 88 | Wt 188.0 lb

## 2020-06-26 DIAGNOSIS — M25571 Pain in right ankle and joints of right foot: Secondary | ICD-10-CM | POA: Insufficient documentation

## 2020-06-26 DIAGNOSIS — F1010 Alcohol abuse, uncomplicated: Secondary | ICD-10-CM

## 2020-06-26 NOTE — Progress Notes (Signed)
Established Patient Office Visit  Subjective:  Patient ID: [name], male    DOB: [birth year]  Age: 59 y.o. MRN: [id number]  CC: No chief complaint on file.   HPI Miguel [name] presents for follow up after ED visit on 06/12/2020. He was seen at the ED, and was treated for Minimally displaced bimagelleolar fracture, was advised to follow up with an Orthopedic Surgeon and continues to wear Cam boot. Currently, he states that he continues to experience constant non radiating sharp pain to right ankle when he ambulates without wearing his Cam boot and he's yet to follow up with an Orthopedic Surgeon. He states that he has not had any alcoholic beverage in one week, denies withdrawal symptoms. He states that he's unable to follow up at [facility] because they are having in person visit and he can not drive to his appointments. He states that he will reach out to [place], endorses that his mood is good, denies suicidal nor homicidal ideation. Overall, he states that he's doing well, but for his right ankle pain.  Past Medical History:  Diagnosis Date  . Alcohol abuse   . Anxiety   . Asthma   . Family history of breast cancer   . GERD (gastroesophageal reflux disease)   . Gout   . Hx of colonic polyps   . Hypertension   . Kidney stone   . OCD (obsessive compulsive disorder)   . Renal colic     Past Surgical History:  Procedure Laterality Date  . CHOLECYSTECTOMY  2012  . COLONOSCOPY    . COLONOSCOPY WITH PROPOFOL N/A 05/28/2020   Procedure: COLONOSCOPY WITH PROPOFOL;  Surgeon: [name], MD;  Location: [facility] ENDOSCOPY;  Service: Gastroenterology;  Laterality: N/A;  . EXTRACORPOREAL SHOCK WAVE LITHOTRIPSY Left 01/12/2019   Procedure: EXTRACORPOREAL SHOCK WAVE LITHOTRIPSY (ESWL);  Surgeon: Billey Co, MD;  Location: [facility] ORS;  Service: Urology;  Laterality: Left;  . UPPER GI ENDOSCOPY      Family History  Problem Relation Age of Onset  . Alcohol abuse Father   . Breast cancer Mother 29     Social History   Socioeconomic History  . Marital status: Married    Spouse name: Not on file  . Number of children: Not on file  . Years of education: Not on file  . Highest education level: Not on file  Occupational History  . Not on file  Tobacco Use  . Smoking status: Former Smoker    Types: Cigarettes  . Smokeless tobacco: Never Used  . Tobacco comment: quit 30 years ago  Vaping Use  . Vaping Use: Never used  Substance and Sexual Activity  . Alcohol use: Yes    Alcohol/week: 12.0 standard drinks    Types: 12 Shots of liquor per week  . Drug use: No  . Sexual activity: Not on file  Other Topics Concern  . Not on file  Social History Narrative   Lives at home with his wife and takes care of his wife. Independent at baseline      - Biggest strain is financial but doesn't think they could got get more help.      Patient expressed interest in possible financial assistance. Informed written consent obtained   Social Determinants of Health   Financial Resource Strain:   . Difficulty of Paying Living Expenses:   Food Insecurity:   . Worried About Charity fundraiser in the Last Year:   . [employer]  in the Last Year:   Transportation Needs:   . Film/video editor (Medical):   Marland Kitchen Lack of Transportation (Non-Medical):   Physical Activity:   . Days of Exercise per Week:   . Minutes of Exercise per Session:   Stress:   . Feeling of Stress :   Social Connections:   . Frequency of Communication with Friends and Family:   . Frequency of Social Gatherings with Friends and Family:   . Attends Religious Services:   . Active Member of Clubs or Organizations:   . Attends Archivist Meetings:   Marland Kitchen Marital Status:   Intimate Partner Violence:   . Fear of Current or Ex-Partner:   . Emotionally Abused:   Marland Kitchen Physically Abused:   . Sexually Abused:     Outpatient Medications Prior to Visit  Medication Sig Dispense Refill  . albuterol (VENTOLIN HFA) 108  (90 Base) MCG/ACT inhaler Inhale 2 puffs into the lungs every 6 (six) hours as needed. 6.7 g 1  . allopurinol (ZYLOPRIM) 300 MG tablet Take 1 tablet (300 mg total) by mouth daily. 30 tablet 2  . amoxicillin (AMOXIL) 500 MG capsule Take 1 capsule (500 mg total) by mouth 3 (three) times daily. 30 capsule 0  . ascorbic acid (VITAMIN C) 500 MG tablet Take by mouth.    . citalopram (CELEXA) 20 MG tablet Take 1 tablet (20 mg total) by mouth daily. 30 tablet 1  . cloNIDine (CATAPRES) 0.1 MG tablet Take 0.1 mg by mouth daily.    . colchicine 0.6 MG tablet Take by mouth.    . Fluticasone-Salmeterol (ADVAIR DISKUS) 100-50 MCG/DOSE AEPB Inhale 1 puff into the lungs 2 (two) times daily. 161 each 0  . folic acid (FOLVITE) 1 MG tablet Take 1 mg by mouth daily.     Marland Kitchen lisinopril (ZESTRIL) 40 MG tablet Take 1 tablet (40 mg total) by mouth daily. 30 tablet 2  . omeprazole (PRILOSEC) 10 MG capsule Take 1 capsule (10 mg total) by mouth daily. 30 capsule 4  . ondansetron (ZOFRAN-ODT) 4 MG disintegrating tablet Take 1 tablet (4 mg total) by mouth every 8 (eight) hours as needed for nausea or vomiting. 20 tablet 0  . polyethylene glycol-electrolytes (NULYTELY) 420 g solution Drink one 8 oz glass every 20 mins until the entire container is finished starting at 5:00pm, today 4000 mL 0  . thiamine 100 MG tablet Take 1 tablet (100 mg total) by mouth daily. 30 tablet 1  . predniSONE (STERAPRED UNI-PAK 21 TAB) 10 MG (21) TBPK tablet Take 6 pills on day one then decrease by 1 pill each day 21 tablet 0   No facility-administered medications prior to visit.    Allergies  Allergen Reactions  . Percocet [Oxycodone-Acetaminophen] Nausea And Vomiting    ROS Review of Systems  Constitutional: Negative.   Respiratory: Negative.   Cardiovascular: Negative.   Musculoskeletal: Positive for arthralgias (right ankle).  Neurological: Negative.   Psychiatric/Behavioral: Negative.       Objective:    Physical Exam HENT:      Head: Normocephalic and atraumatic.  Cardiovascular:     Rate and Rhythm: Normal rate and regular rhythm.     Pulses: Normal pulses.     Heart sounds: Normal heart sounds.  Pulmonary:     Effort: Pulmonary effort is normal.     Breath sounds: Normal breath sounds.  Musculoskeletal:        General: Tenderness (right ankle with palpation) present.  Skin:  General: Skin is warm and dry.  Neurological:     General: No focal deficit present.     Mental Status: He is alert and oriented to person, place, and time. Mental status is at baseline.  Psychiatric:        Mood and Affect: Mood normal.        Behavior: Behavior normal.        Thought Content: Thought content normal.        Judgment: Judgment normal.     BP 104/72 (BP Location: Left Arm, Patient Position: Sitting, Cuff Size: Normal)   Pulse 88   Wt 188 lb (85.3 kg)   SpO2 95%   BMI 25.50 kg/m  Wt Readings from Last 3 Encounters:  06/26/20 188 lb (85.3 kg)  06/10/20 175 lb (79.4 kg)  06/08/20 189 lb 14.4 oz (86.1 kg)     Health Maintenance Due  Topic Date Due  . Hepatitis C Screening  Never done  . COVID-19 Vaccine (1) Never done  . INFLUENZA VACCINE  06/16/2020    There are no preventive care reminders to display for this patient.  Lab Results  Component Value Date   TSH 1.831 03/18/2020   Lab Results  Component Value Date   WBC 5.9 06/11/2020   HGB 10.7 (L) 06/11/2020   HCT 33.9 (L) 06/11/2020   MCV 93.6 06/11/2020   PLT 239 06/11/2020   Lab Results  Component Value Date   NA 135 06/11/2020   K 3.6 06/11/2020   CO2 14 (L) 06/11/2020   GLUCOSE 110 (H) 06/11/2020   BUN 19 06/11/2020   CREATININE 1.98 (H) 06/11/2020   BILITOT 0.8 05/22/2020   ALKPHOS 90 05/22/2020   AST 9 05/22/2020   ALT 8 05/22/2020   PROT 7.1 05/22/2020   ALBUMIN 4.5 05/22/2020   CALCIUM 8.8 (L) 06/11/2020   ANIONGAP 12 06/11/2020   Lab Results  Component Value Date   CHOL 155 05/22/2020   Lab Results  Component Value  Date   HDL 65 05/22/2020   Lab Results  Component Value Date   LDLCALC 73 05/22/2020   Lab Results  Component Value Date   TRIG 92 05/22/2020   Lab Results  Component Value Date   CHOLHDL 2.4 05/22/2020   Lab Results  Component Value Date   HGBA1C 5.3 03/18/2020      Assessment & Plan:    1. Alcohol abuse - He was advised to continue on alcohol abstinence, and to follow up with [facility] for his mental health care.  2. Right ankle pain, unspecified chronicity - He was advised to continue to wear his Cam boot, and follow up with Orthopedic Surgeon.    Follow-up: Return in about 7 weeks (around 08/14/2020), or if symptoms worsen or fail to improve.    Audris Speaker Jerold Coombe, NP

## 2020-06-27 ENCOUNTER — Inpatient Hospital Stay: Payer: Self-pay

## 2020-07-15 ENCOUNTER — Encounter: Payer: Self-pay | Admitting: Licensed Clinical Social Worker

## 2020-07-15 ENCOUNTER — Ambulatory Visit: Payer: Self-pay | Admitting: Licensed Clinical Social Worker

## 2020-07-15 ENCOUNTER — Other Ambulatory Visit: Payer: Self-pay | Admitting: Gerontology

## 2020-07-15 ENCOUNTER — Telehealth: Payer: Self-pay | Admitting: Licensed Clinical Social Worker

## 2020-07-15 DIAGNOSIS — Z1501 Genetic susceptibility to malignant neoplasm of breast: Secondary | ICD-10-CM | POA: Insufficient documentation

## 2020-07-15 DIAGNOSIS — Z803 Family history of malignant neoplasm of breast: Secondary | ICD-10-CM

## 2020-07-15 DIAGNOSIS — Z1379 Encounter for other screening for genetic and chromosomal anomalies: Secondary | ICD-10-CM | POA: Insufficient documentation

## 2020-07-15 DIAGNOSIS — Z86010 Personal history of colonic polyps: Secondary | ICD-10-CM

## 2020-07-15 DIAGNOSIS — Z1509 Genetic susceptibility to other malignant neoplasm: Secondary | ICD-10-CM | POA: Insufficient documentation

## 2020-07-15 NOTE — Telephone Encounter (Signed)
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 VUS in [place] called c.445G>A and VUS in TERT called c.2689G>A also identified. Discussed these results in detail with Miguel [name] including cancer risks and management guidelines, as well as who to test next in the family.

## 2020-07-15 NOTE — Progress Notes (Signed)
Genetic Test Results  HPI:  Miguel [name] was previously seen in the [place] clinic due to a personal history of colon polyps and concerns regarding a hereditary predisposition to cancer. Please refer to our prior cancer genetics clinic note for more information regarding our discussion, assessment and recommendations, at the time. Miguel [name] recent genetic test results were disclosed to him, as were recommendations warranted by these results. These results and recommendations are discussed in more detail below.  CANCER HISTORY:  Oncology History   No history exists.    FAMILY HISTORY:  We obtained a detailed, 4-generation family history.  Significant diagnoses are listed below: Family History  Problem Relation Age of Onset  . Alcohol abuse Father   . Breast cancer Mother 78    Miguel [name] does not have children. He has 2 brothers, he does not believe they have had colonoscopies.   Miguel [name] mother is living at 89 with a history of breast cancer at 58, he reports she is in great health. Patient is unaware of cancers or colon polyps in maternal aunts/uncles/cousins/grandparents.  Miguel [name] father recently passed at 6. He did not have cancer or colon polyps that patient is aware of. Patient is unaware of cancer or polyps in paternal aunts/uncles/cousins/grandparents.  Miguel [name] is unaware of previous family history of genetic testing for hereditary cancer risks. Patient's maternal ancestors are of unknown descent, and paternal ancestors are of unknown descent. There is no reported Ashkenazi Jewish ancestry. There is no known consanguinity.     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 Remainder of testing was normal/negative.  The Multi-Cancer Panel offered by [facility]  includes sequencing and/or deletion duplication testing of the following 85 genes: AIP, ALK, APC, ATM, AXIN2,BAP1,  BARD1, BLM, BMPR1A, BRCA1, BRCA2, BRIP1, CASR, CDC73, CDH1, CDK4, CDKN1B, CDKN1C, CDKN2A (p14ARF), CDKN2A (p16INK4a), CEBPA, CHEK2, CTNNA1, DICER1, DIS3L2, EGFR (c.2369C>T, p.Thr790Met variant only), EPCAM (Deletion/duplication testing only), FH, FLCN, GATA2, GPC3, GREM1 (Promoter region deletion/duplication testing only), HOXB13 (c.251G>A, p.Gly84Glu), HRAS, KIT, MAX, MEN1, MET, MITF (c.952G>A, p.Glu318Lys variant only), MLH1, MSH2, MSH3, MSH6, MUTYH, NBN, NF1, NF2, NTHL1, PALB2, PDGFRA, PHOX2B, PMS2, POLD1, POLE, POT1, PRKAR1A, PTCH1, PTEN, RAD50, RAD51C, RAD51D, RB1, RECQL4, RET, RNF43, RUNX1, SDHAF2, SDHA (sequence changes only), SDHB, SDHC, SDHD, SMAD4, SMARCA4, SMARCB1, SMARCE1, STK11, SUFU, TERC, TERT, TMEM127, TP53, TSC1, TSC2, VHL, WRN and WT1.   The test report has been scanned into EPIC and is located under the Molecular Pathology section of the Results Review tab.  A portion of the result report is included below for reference.    Genetic testing did identify 2 Variants of uncertain significance (VUS) - one in the FLCN gene called c.445G>A, a second in the TERT gene called c.2689G>A. At this time, it is unknown if these variants are associated with increased cancer risk or if they are normal findings, but most variants such as these get reclassified to being inconsequential. They should not be used to make medical management decisions. With time, we suspect the lab will determine the significance of these variants, if any. If we do learn more about them, we will try to contact Miguel [name] to discuss it further. However, it is important to stay in touch with [name] periodically and keep the address and phone number up to date.  ADDITIONAL GENETIC TESTING: We discussed with  Miguel [name] that his genetic testing was fairly extensive.  If there are genes identified to increase cancer risk that  can be analyzed in the future, we would be happy to discuss and coordinate this testing at that time. We recommended that he keep in touch as it is possible there is a hereditary explanation for his colon polyps that we have not identified yet.    DISCUSSION: CHEK2   We discussed the cancers, inheritance, management asscociated with CHEK2, and the importance of telling family members about this result.   Clinical condition The CHEK2 gene is associated with an increased risk for autosomal dominant adult-onset cancers, including male breast, colon, thyroid, prostate, and possibly others such as male breast (PMID: 21224825, 00370488, 89169450, 38882800, 34917915). The risks of these cancers, particularly breast, have been determined to be both variant- and family history-dependent (PMID: 05697948, 01655374).  Lifetime risks for male breast cancer related to frameshift variants, such as 1100delC, have been estimated to be 25-39% in heterozygotes (PMID: 82707867, 54492010). The risks for most missense variants are unclear, but risks for certain variants (such as p.Ile157Thr) are thought to be lower (PMID: 07121975, 88325498). This particular variant is the one that was found in Miguel [name], therefore his risks are thought to be lower than those typically reported for CHEK2 based on the 1100delC variant.   Gene information The CHEK2 gene encodes the CHEK2 enzyme, which helps to ensure accurate DNA repair. In this role, CHEK2 acts as a tumor suppressor by promoting stability of the genome and by preventing tumor formation. The CHEK2 gene is associated with the DNA damage repair response involving the Fanconi anemia-BRCA pathway (PMID: 26415830). A pathogenic variant that disrupts the function of CHEK2 decreases the ability of the cell to protect the integrity of the DNA (PMID: 94076808).  Inheritance Hereditary predisposition to cancer due to pathogenic variants in the CHEK2 gene has autosomal dominant  inheritance. This means that an individual with a pathogenic variant has a 50% chance of passing the condition on to his/her offspring. Most cases are inherited from a parent, but some cases may occur spontaneously (i.e., an individual with a pathogenic variant has parents who do not have it). Identification of a pathogenic variant allows for the recognition of at-risk relatives who can pursue testing for the familial variant.  Management:    Colon Cancer -For individuals unaffected by colorectal cancer with a first degree relative with colorectal cancer, begin colonoscopy at age 76 or 34 years younger than relative's age at diagnosis and repeat every 5 years -For individuals unaffected by colorectal cancer and no family history of colorectal cancer, begin colonoscopy at age 4 and repeat every 5 years.  Miguel [name] reports he is already getting colonoscopies every 5 years.   These guidelines are based on current NCCN guidelines (NCCN v.2.2021).  These guidelines are subject to change and continually updated and should be directly referenced for future medical management.     It has been suggested that men with a CHEK2 pathogenic variant and a first-degree relative with prostate cancer have an annual prostate-specific antigen (PSA) analysis (PMID: 81103159). However, the benefits of screening for prostate cancer among men with a pathogenic variant in CHEK2 are uncertain (PMID: 45859292).  An individual's cancer risk and medical management are not determined by genetic test results alone. Overall cancer risk assessment incorporates additional factors, including personal medical history, family history, and any available genetic information that may result in a personalized plan for cancer prevention and surveillance.  Knowing if a pathogenic CHEK2 variant is present is advantageous. At-risk relatives can be identified, enabling pursuit of a diagnostic evaluation. Information regarding hereditary cancer  susceptibility genes is constantly evolving, and more clinically relevant data regarding CHEK2 is likely to become available in the near future. Awareness of this cancer predisposition encourages patients and their providers to inform at-risk family members, to diligently follow condition-specific screening protocols, and to be vigilant in maintaining close and regular contact with their local genetics clinic in anticipation of new information.  MSH3  The MSH3 gene is associated with autosomal recessive MSH3-associated polyposis (MedGen UID: [id number]). This means an individual would need 2 pathogenic variants (one in each MSH3 gene) to have the condition.  MSH3-associated polyposis is an adult-onset hereditary cancer predisposition syndrome characterized by the development of multiple colonic adenomas, often with progression to colorectal cancer (PMID: 30092330). An increased risk of other cancers has also been suggested, but available evidence is not sufficient to make a determination at this time (PMID: 07622633). 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 This result could have implications for family members.    FAMILY MEMBERS: It is important that all of Miguel [name] relatives (both men and women) know of the presence of these gene mutations. Site-specific genetic testing can sort out who in the family is at risk and who is not.   Miguel [name] siblings have a 50% chance to have inherited these mutations. We recommend they have genetic testing for these same mutations, as identifying the presence of these mutations would allow them to also take advantage of risk-reducing measures. The CHEK2 result may also explain his mother's breast cancer diagnosis. If she tests positive, this would have implications for her side of the  family.   PLAN:   1. These results will be made available to  Miguel [name]'s referring provider, Dr. Vicente Males and his PCP, who he would like to follow him for his CHEK2 mutation.    2. Miguel [name] plans to discuss these results with his family and will reach out to [name] if we can be of any assistance in coordinating genetic testing for any of his relatives. All of his relatives live in [place].    SUPPORT AND RESOURCES: If Miguel [name] is interested in CHEK2-specific information and support, there are two groups, Facing Our Risk (www.facingourrisk.com) and Bright Pink (www.brightpink.org) which some people have found useful. They provide opportunities to speak with other individuals from high-risk families. To locate genetic counselors in other cities, visit the website of the [employer] (ArtistMovie.se) and Secretary/administrator for a Social worker by zip code.  We encouraged Miguel [name] to remain in contact with [name] on an annual basis so we can update his personal and family histories, and let him know of advances in cancer genetics that may benefit the family. Our contact number was provided. Mr. Klug questions were answered to his satisfaction today, and he knows he is welcome to call anytime with additional questions.   Faith Rogue, MS, [facility] Genetic Counselor [email or web address] Phone: [phone]

## 2020-07-16 ENCOUNTER — Other Ambulatory Visit: Payer: Self-pay | Admitting: Gerontology

## 2020-07-16 DIAGNOSIS — F1010 Alcohol abuse, uncomplicated: Secondary | ICD-10-CM

## 2020-07-16 MED ORDER — FOLIC ACID 1 MG PO TABS: 1.0000 mg | ORAL_TABLET | Freq: Every day | ORAL | 3 refills | Status: DC

## 2020-08-01 ENCOUNTER — Other Ambulatory Visit: Payer: Self-pay

## 2020-08-01 ENCOUNTER — Emergency Department
Admission: EM | Admit: 2020-08-01 | Discharge: 2020-08-01 | Disposition: A | Discharge status: Home / Self Care | Payer: Medicaid Other | Attending: Emergency Medicine | Admitting: Emergency Medicine

## 2020-08-01 DIAGNOSIS — Z79899 Other long term (current) drug therapy: Secondary | ICD-10-CM | POA: Insufficient documentation

## 2020-08-01 DIAGNOSIS — Z87891 Personal history of nicotine dependence: Secondary | ICD-10-CM | POA: Insufficient documentation

## 2020-08-01 DIAGNOSIS — Z7951 Long term (current) use of inhaled steroids: Secondary | ICD-10-CM | POA: Insufficient documentation

## 2020-08-01 DIAGNOSIS — I10 Essential (primary) hypertension: Secondary | ICD-10-CM | POA: Insufficient documentation

## 2020-08-01 DIAGNOSIS — J45909 Unspecified asthma, uncomplicated: Secondary | ICD-10-CM | POA: Insufficient documentation

## 2020-08-01 DIAGNOSIS — F10220 Alcohol dependence with intoxication, uncomplicated: Secondary | ICD-10-CM | POA: Insufficient documentation

## 2020-08-01 LAB — CBC WITH DIFFERENTIAL/PLATELET
Abs Immature Granulocytes: 0.02 10*3/uL (ref 0.00–0.07)
Basophils Absolute: 0.1 10*3/uL (ref 0.0–0.1)
Basophils Relative: 1 %
Eosinophils Absolute: 0.2 10*3/uL (ref 0.0–0.5)
Eosinophils Relative: 3 %
HCT: 41.6 % (ref 39.0–52.0)
Hemoglobin: 13.6 g/dL (ref 13.0–17.0)
Immature Granulocytes: 0 %
Lymphocytes Relative: 30 %
Lymphs Abs: 2.6 10*3/uL (ref 0.7–4.0)
MCH: 30 pg (ref 26.0–34.0)
MCHC: 32.7 g/dL (ref 30.0–36.0)
MCV: 91.6 fL (ref 80.0–100.0)
Monocytes Absolute: 0.7 10*3/uL (ref 0.1–1.0)
Monocytes Relative: 8 %
Neutro Abs: 4.9 10*3/uL (ref 1.7–7.7)
Neutrophils Relative %: 58 %
Platelets: 331 10*3/uL (ref 150–400)
RBC: 4.54 MIL/uL (ref 4.22–5.81)
RDW: 14.2 % (ref 11.5–15.5)
WBC: 8.5 10*3/uL (ref 4.0–10.5)
nRBC: 0 % (ref 0.0–0.2)

## 2020-08-01 LAB — COMPREHENSIVE METABOLIC PANEL
ALT: 14 U/L (ref 0–44)
AST: 15 U/L (ref 15–41)
Albumin: 4.4 g/dL (ref 3.5–5.0)
Alkaline Phosphatase: 79 U/L (ref 38–126)
Anion gap: 13 (ref 5–15)
BUN: 18 mg/dL (ref 6–20)
CO2: 21 mmol/L — ABNORMAL LOW (ref 22–32)
Calcium: 8.9 mg/dL (ref 8.9–10.3)
Chloride: 111 mmol/L (ref 98–111)
Creatinine, Ser: 1.13 mg/dL (ref 0.61–1.24)
GFR calc Af Amer: >60 mL/min (ref >60)
GFR calc non Af Amer: >60 mL/min (ref >60)
Glucose, Bld: 103 mg/dL — ABNORMAL HIGH (ref 70–99)
Potassium: 3.8 mmol/L (ref 3.5–5.1)
Sodium: 145 mmol/L (ref 135–145)
Total Bilirubin: 0.5 mg/dL (ref 0.3–1.2)
Total Protein: 7.8 g/dL (ref 6.5–8.1)

## 2020-08-01 LAB — LIPASE, BLOOD: Lipase: 31 U/L (ref 11–51)

## 2020-08-01 LAB — ETHANOL: Alcohol, Ethyl (B): 234 mg/dL — ABNORMAL HIGH (ref <10)

## 2020-08-01 MED ORDER — ACETAMINOPHEN 500 MG PO TABS
1000.0000 mg | ORAL_TABLET | Freq: Once | ORAL | Status: AC
  Administered 2020-08-01: 1000 mg via ORAL
  Filled 2020-08-01: qty 2

## 2020-08-01 MED ORDER — LORAZEPAM 2 MG/ML IJ SOLN
1.0000 mg | Freq: Once | INTRAMUSCULAR | Status: AC
  Administered 2020-08-01: 1 mg via INTRAVENOUS
  Filled 2020-08-01: qty 1

## 2020-08-01 MED ORDER — THIAMINE HCL 100 MG/ML IJ SOLN
100.0000 mg | Freq: Once | INTRAMUSCULAR | Status: AC
  Administered 2020-08-01: 100 mg via INTRAVENOUS
  Filled 2020-08-01: qty 2

## 2020-08-01 MED ORDER — CHLORDIAZEPOXIDE HCL 25 MG PO CAPS
50.0000 mg | ORAL_CAPSULE | Freq: Once | ORAL | Status: AC
  Administered 2020-08-01: 50 mg via ORAL
  Filled 2020-08-01: qty 2

## 2020-08-01 MED ORDER — FAMOTIDINE IN NACL 20-0.9 MG/50ML-% IV SOLN
20.0000 mg | Freq: Once | INTRAVENOUS | Status: AC
  Administered 2020-08-01: 20 mg via INTRAVENOUS
  Filled 2020-08-01: qty 50

## 2020-08-01 MED ORDER — SODIUM CHLORIDE 0.9 % IV BOLUS
1000.0000 mL | Freq: Once | INTRAVENOUS | Status: AC
  Administered 2020-08-01: 1000 mL via INTRAVENOUS

## 2020-08-01 MED ORDER — ONDANSETRON HCL 4 MG/2ML IJ SOLN
4.0000 mg | Freq: Once | INTRAMUSCULAR | Status: AC
  Administered 2020-08-01: 4 mg via INTRAVENOUS
  Filled 2020-08-01: qty 2

## 2020-08-01 NOTE — ED Provider Notes (Signed)
[facility] Emergency Department Provider Note  ____________________________________________  Time seen: Approximately 2:24 AM  I have reviewed the triage vital signs and the nursing notes.   HISTORY  Chief Complaint Withdrawal   HPI Miguel [name] is a 59 y.o. male with a history of alcohol abuse and GERD who presents for evaluation of withdrawal symptoms.  Patient reports that he has been on a binge for the last 24 hours.  Last drink was at 9 PM.  He reports that he started vomiting this evening.  Is complaining of severe cramping diffuse abdominal pain and several episodes of nonbloody nonbilious emesis.  He feels anxious, he is shaking.  He has a history of alcohol withdrawal seizures.   Past Medical History:  Diagnosis Date  . Alcohol abuse   . Anxiety   . Asthma   . Family history of breast cancer   . GERD (gastroesophageal reflux disease)   . Gout   . Hx of colonic polyps   . Hypertension   . Kidney stone   . 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 OCD (obsessive compulsive disorder)   . Renal colic     Patient Active Problem List   Diagnosis Date Noted  . Genetic testing 07/15/2020  . 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 Right ankle pain 06/26/2020  . Family history of breast cancer   . Hx of colonic polyps   . Hospital discharge follow-up 06/05/2020  . Kidney function test abnormal 06/05/2020  . Abdominal pain 04/12/2020  . Hypertensive urgency 04/12/2020  . Alcohol withdrawal ([place]) 03/04/2020  . Right-sided chest wall pain 02/28/2020  . Alcohol abuse with alcohol-induced mood disorder ([place]) 12/02/2019  . Moderate episode of recurrent major depressive disorder ([place])   . Health care maintenance 10/26/2019  . Right knee pain 10/26/2019  . Generalized anxiety disorder 10/14/2019  . MDD (major depressive disorder), recurrent severe, without psychosis ([place]) 07/27/2019  . Social anxiety disorder 07/27/2019   . Left-sided chest wall pain 06/14/2019  . Alcoholic cirrhosis of liver without ascites ([place]) 06/14/2019  . Alcohol abuse 06/14/2019  . AKI (acute kidney injury) ([place]) 05/27/2019  . Alcohol withdrawal syndrome without complication ([place]) 33/82/5053  . Alcoholic intoxication without complication ([place])   . Sepsis ([place]) 03/08/2019  . Breast lump or mass 10/04/2018  . Sepsis secondary to UTI ([place]) 09/14/2018  . Asthma 07/07/2018  . GERD (gastroesophageal reflux disease) 07/07/2018  . OCD (obsessive compulsive disorder) 07/07/2018  . Self-inflicted laceration of left wrist ([place]) 03/10/2018  . Leg hematoma 12/25/2017  . Suicide and self-inflicted injury by cutting and piercing instrument ([place]) 03/10/2017  . Severe recurrent major 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 ([place]) 03/09/2017  . Substance induced mood disorder ([place]) 08/15/2016  . Involuntary commitment 08/15/2016  . Alcohol use disorder, severe, dependence ([place]) 02/05/2016  . Hypertension 12/05/2015  . Tachycardia 12/05/2015  . Gout 11/13/2015  . Chronic back pain 05/02/2015    Past Surgical History:  Procedure Laterality Date  . CHOLECYSTECTOMY  2012  . COLONOSCOPY    . COLONOSCOPY WITH PROPOFOL N/A 05/28/2020   Procedure: COLONOSCOPY WITH PROPOFOL;  Surgeon: [name], MD;  Location: [facility] ENDOSCOPY;  Service: Gastroenterology;  Laterality: N/A;  . EXTRACORPOREAL SHOCK WAVE LITHOTRIPSY Left 01/12/2019   Procedure: EXTRACORPOREAL SHOCK WAVE LITHOTRIPSY (ESWL);  Surgeon: Billey Co, MD;  Location: [facility] ORS;  Service: Urology;  Laterality: Left;  . UPPER GI ENDOSCOPY  Prior to Admission medications   Medication Sig Start Date End Date Taking? Authorizing Provider  albuterol (VENTOLIN HFA) 108 (90 Base) MCG/ACT inhaler Inhale 2 puffs into the lungs every 6 (six) hours as needed. 03/21/20   Clapacs, Madie Reno, MD  allopurinol (ZYLOPRIM) 300 MG tablet Take 1 tablet (300 mg total) by mouth daily. 04/23/20   Iloabachie, Chioma  E, NP  amoxicillin (AMOXIL) 500 MG capsule Take 1 capsule (500 mg total) by mouth 3 (three) times daily. 06/10/20   Fisher, Linden Dolin, PA-C  ascorbic acid (VITAMIN C) 500 MG tablet Take by mouth. 08/01/19   [provider]  citalopram (CELEXA) 20 MG tablet Take 1 tablet (20 mg total) by mouth daily. 03/21/20 03/21/21  Clapacs, Madie Reno, MD  cloNIDine (CATAPRES) 0.1 MG tablet Take 0.1 mg by mouth daily.    [provider]  colchicine 0.6 MG tablet Take by mouth. 08/01/19   [provider]  Fluticasone-Salmeterol (ADVAIR DISKUS) 100-50 MCG/DOSE AEPB Inhale 1 puff into the lungs 2 (two) times daily. 03/21/20   Clapacs, Madie Reno, MD  folic acid (FOLVITE) 1 MG tablet Take 1 tablet (1 mg total) by mouth daily. 07/16/20   Iloabachie, Chioma E, NP  lisinopril (ZESTRIL) 40 MG tablet Take 1 tablet (40 mg total) by mouth daily. 04/23/20   Iloabachie, Chioma E, NP  omeprazole (PRILOSEC) 10 MG capsule Take 1 capsule (10 mg total) by mouth daily. 04/21/20   Cuthriell, Charline Bills, PA-C  ondansetron (ZOFRAN-ODT) 4 MG disintegrating tablet Take 1 tablet (4 mg total) by mouth every 8 (eight) hours as needed for nausea or vomiting. 04/21/20   Cuthriell, Charline Bills, PA-C  polyethylene glycol-electrolytes (NULYTELY) 420 g solution Drink one 8 oz glass every 20 mins until the entire container is finished starting at 5:00pm, today 05/27/20   Lucilla Lame, MD  thiamine 100 MG tablet Take 1 tablet (100 mg total) by mouth daily. 03/22/20   Clapacs, Madie Reno, MD    Allergies Percocet [oxycodone-acetaminophen]  Family History  Problem Relation Age of Onset  . Alcohol abuse Father   . Breast cancer Mother 15    Social History Social History   Tobacco Use  . Smoking status: Former Smoker    Types: Cigarettes  . Smokeless tobacco: Never Used  . Tobacco comment: quit 30 years ago  Vaping Use  . Vaping Use: Never used  Substance Use Topics  . Alcohol use: Yes    Alcohol/week: 12.0 standard drinks    Types: 12  Shots of liquor per week  . Drug use: No    Review of Systems  Constitutional: Negative for fever. Eyes: Negative for visual changes. ENT: Negative for sore throat. Neck: No neck pain  Cardiovascular: Negative for chest pain. Respiratory: Negative for shortness of breath. Gastrointestinal: + abdominal pain, nausea, and vomiting. No diarrhea. Genitourinary: Negative for dysuria. Musculoskeletal: Negative for back pain. Skin: Negative for rash. Neurological: Negative for headaches, weakness or numbness. Psych: No SI or HI  ____________________________________________   PHYSICAL EXAM:  VITAL SIGNS: ED Triage Vitals  Enc Vitals Group     BP 08/01/20 0127 (!) 137/97     Pulse Rate 08/01/20 0127 (!) 128     Resp 08/01/20 0127 20     Temp 08/01/20 0125 98.3 F (36.8 C)     Temp Source 08/01/20 0125 Oral     SpO2 08/01/20 0127 97 %     Weight 08/01/20 0125 195 lb (88.5 kg)     Height  08/01/20 0125 6' (1.829 m)     Head Circumference --      Peak Flow --      Pain Score 08/01/20 0125 0     Pain Loc --      Pain Edu? --      Excl. in [place]? --     Constitutional: Alert and oriented. Well appearing and in no apparent distress. HEENT:      Head: Normocephalic and atraumatic.         Eyes: Conjunctivae are normal. Sclera is non-icteric.       Mouth/Throat: Mucous membranes are moist.       Neck: Supple with no signs of meningismus. Cardiovascular: Tachycardic with regular rhythm. No murmurs, gallops, or rubs. 2+ symmetrical distal pulses are present in all extremities. No JVD. Respiratory: Normal respiratory effort. Lungs are clear to auscultation bilaterally. No wheezes, crackles, or rhonchi.  Gastrointestinal: Soft, mild diffuse tenderness, and non distended with positive bowel sounds. No rebound or guarding. Genitourinary: No CVA tenderness. Musculoskeletal: Nontender with normal range of motion in all extremities. No edema, cyanosis, or erythema of extremities. Neurologic:  Normal speech and language. Face is symmetric. Moving all extremities. No gross focal neurologic deficits are appreciated. Skin: Skin is warm, dry and intact. No rash noted. Psychiatric: Mood and affect are normal. Speech and behavior are normal.  ____________________________________________   LABS (all labs ordered are listed, but only abnormal results are displayed)  Labs Reviewed  COMPREHENSIVE METABOLIC PANEL - Abnormal; Notable for the following components:      Result Value   CO2 21 (*)    Glucose, Bld 103 (*)    All other components within normal limits  ETHANOL - Abnormal; Notable for the following components:   Alcohol, Ethyl (B) 234 (*)    All other components within normal limits  CBC WITH DIFFERENTIAL/PLATELET  LIPASE, BLOOD  URINE DRUG SCREEN, QUALITATIVE ([facility] ONLY)   ____________________________________________  EKG  none  ____________________________________________  RADIOLOGY  none  ____________________________________________   PROCEDURES  Procedure(s) performed: None Procedures Critical Care performed:  None ____________________________________________   INITIAL IMPRESSION / ASSESSMENT AND PLAN / ED COURSE   59 y.o. male with a history of alcohol abuse and GERD who presents for evaluation of withdrawal symptoms. Last drink 5.5 hrs ago. [place] of 7 on arrival. Complaining of tremors, HA, vomiting, diffuse cramping abdominal pain.  Afebrile, abdomen is soft and nondistended with mild diffuse tenderness, no rebound or guarding, no localized tenderness.  Most likely alcoholic gastritis since patient has been on a binge over the last 24 hours.  Will check labs for any signs of alcoholic ketoacidosis, significant electrolyte derangements, AKI or dehydration.  Will give IV fluids, IV Zofran, IV Pepcid, IV Ativan.  Patient under CIWA.  Initial CIWA score of 7.  No SI or HI. Old medical records reviewed.  _________________________ 4:58 AM on  08/01/2020 -----------------------------------------  Labs with no acute abnormalities.  CIWA of 0.  Patient remains stable.  Clinically sober although he does have an alcohol level of 234 as of 2 AM.  Therefore will hold until morning to discharge home since he does not have a sober ride home.  Patient will be going home by cab.      _____________________________________________ Please note:  Patient was evaluated in Emergency Department today for the symptoms described in the history of present illness. Patient was evaluated in the context of the global COVID-19 pandemic, which necessitated consideration that the patient might be at risk  for infection with the SARS-CoV-2 virus that causes COVID-19. Institutional protocols and algorithms that pertain to the evaluation of patients at risk for COVID-19 are in a state of rapid change based on information released by regulatory bodies including the CDC and federal and state organizations. These policies and algorithms were followed during the patient's care in the ED.  Some ED evaluations and interventions may be delayed as a result of limited staffing during the pandemic.   [place] Controlled Substance Database was reviewed by me. ____________________________________________   FINAL CLINICAL IMPRESSION(S) / ED DIAGNOSES   Final diagnoses:  Alcohol dependence with uncomplicated intoxication ([place])      NEW MEDICATIONS STARTED DURING THIS VISIT:  ED Discharge Orders    None       Note:  This document was prepared using Dragon voice recognition software and may include unintentional dictation errors.    Alfred Levins, [place], MD 08/01/20 [phone]

## 2020-08-01 NOTE — ED Notes (Signed)
Hourly rounding reveals patient in room. No complaints, stable, in no acute distress. Q15 minute rounds and monitoring via Rover and Officer to continue.   

## 2020-08-01 NOTE — ED Notes (Signed)
Pt. Transferred from Triage to room 19H after dressing out and screening for contraband. Report to include Situation, Background, Assessment and Recommendations from [place]. Pt. Oriented to [place] including Q15 minute rounds as well as Engineer, drilling for their protection. Patient is alert and oriented, warm and dry in no acute distress. Patient denies SI, HI, and AVH. Patient is here for alcohol detox.Pt. Encouraged to let me know if needs arise.

## 2020-08-01 NOTE — ED Triage Notes (Signed)
Pt brought in by EMS for alcohol withdrawals, hx of alcohol abuse. States is trying to detox on his own, but now vomiting and has tremors.

## 2020-08-14 ENCOUNTER — Encounter: Payer: Self-pay | Admitting: Emergency Medicine

## 2020-08-14 ENCOUNTER — Emergency Department: Payer: Self-pay

## 2020-08-14 ENCOUNTER — Other Ambulatory Visit: Payer: Self-pay

## 2020-08-14 ENCOUNTER — Ambulatory Visit: Payer: Medicaid Other | Admitting: Gerontology

## 2020-08-14 ENCOUNTER — Emergency Department
Admission: EM | Admit: 2020-08-14 | Discharge: 2020-08-15 | Disposition: A | Discharge status: Home / Self Care | Payer: Self-pay | Attending: Emergency Medicine | Admitting: Emergency Medicine

## 2020-08-14 DIAGNOSIS — F10920 Alcohol use, unspecified with intoxication, uncomplicated: Secondary | ICD-10-CM

## 2020-08-14 DIAGNOSIS — Z79899 Other long term (current) drug therapy: Secondary | ICD-10-CM | POA: Insufficient documentation

## 2020-08-14 DIAGNOSIS — Z20822 Contact with and (suspected) exposure to covid-19: Secondary | ICD-10-CM | POA: Insufficient documentation

## 2020-08-14 DIAGNOSIS — F10220 Alcohol dependence with intoxication, uncomplicated: Secondary | ICD-10-CM | POA: Insufficient documentation

## 2020-08-14 DIAGNOSIS — I10 Essential (primary) hypertension: Secondary | ICD-10-CM | POA: Insufficient documentation

## 2020-08-14 DIAGNOSIS — J45909 Unspecified asthma, uncomplicated: Secondary | ICD-10-CM | POA: Insufficient documentation

## 2020-08-14 DIAGNOSIS — Z87891 Personal history of nicotine dependence: Secondary | ICD-10-CM | POA: Insufficient documentation

## 2020-08-14 DIAGNOSIS — Z7951 Long term (current) use of inhaled steroids: Secondary | ICD-10-CM | POA: Insufficient documentation

## 2020-08-14 LAB — URINE DRUG SCREEN, QUALITATIVE (ARMC ONLY)
Amphetamines, Ur Screen: NOT DETECTED
Barbiturates, Ur Screen: NOT DETECTED
Benzodiazepine, Ur Scrn: POSITIVE — AB
Cannabinoid 50 Ng, Ur ~~LOC~~: POSITIVE — AB
Cocaine Metabolite,Ur ~~LOC~~: NOT DETECTED
MDMA (Ecstasy)Ur Screen: NOT DETECTED
Methadone Scn, Ur: NOT DETECTED
Opiate, Ur Screen: NOT DETECTED
Phencyclidine (PCP) Ur S: NOT DETECTED
Tricyclic, Ur Screen: NOT DETECTED

## 2020-08-14 LAB — ACETAMINOPHEN LEVEL: Acetaminophen (Tylenol), Serum: <10 ug/mL — ABNORMAL LOW (ref 10–30)

## 2020-08-14 LAB — RESPIRATORY PANEL BY RT PCR (FLU A&B, COVID)
Influenza A by PCR: NEGATIVE
Influenza B by PCR: NEGATIVE
SARS Coronavirus 2 by RT PCR: NEGATIVE

## 2020-08-14 LAB — COMPREHENSIVE METABOLIC PANEL
ALT: 12 U/L (ref 0–44)
AST: 13 U/L — ABNORMAL LOW (ref 15–41)
Albumin: 4.5 g/dL (ref 3.5–5.0)
Alkaline Phosphatase: 68 U/L (ref 38–126)
Anion gap: 13 (ref 5–15)
BUN: 21 mg/dL — ABNORMAL HIGH (ref 6–20)
CO2: 20 mmol/L — ABNORMAL LOW (ref 22–32)
Calcium: 8.4 mg/dL — ABNORMAL LOW (ref 8.9–10.3)
Chloride: 107 mmol/L (ref 98–111)
Creatinine, Ser: 1.21 mg/dL (ref 0.61–1.24)
GFR calc Af Amer: >60 mL/min (ref >60)
GFR calc non Af Amer: >60 mL/min (ref >60)
Glucose, Bld: 87 mg/dL (ref 70–99)
Potassium: 3.9 mmol/L (ref 3.5–5.1)
Sodium: 140 mmol/L (ref 135–145)
Total Bilirubin: 0.6 mg/dL (ref 0.3–1.2)
Total Protein: 7.5 g/dL (ref 6.5–8.1)

## 2020-08-14 LAB — BRAIN NATRIURETIC PEPTIDE: B Natriuretic Peptide: 31 pg/mL (ref 0.0–100.0)

## 2020-08-14 LAB — CBC
HCT: 40.9 % (ref 39.0–52.0)
Hemoglobin: 13.4 g/dL (ref 13.0–17.0)
MCH: 29.7 pg (ref 26.0–34.0)
MCHC: 32.8 g/dL (ref 30.0–36.0)
MCV: 90.7 fL (ref 80.0–100.0)
Platelets: 265 10*3/uL (ref 150–400)
RBC: 4.51 MIL/uL (ref 4.22–5.81)
RDW: 14.6 % (ref 11.5–15.5)
WBC: 10.2 10*3/uL (ref 4.0–10.5)
nRBC: 0 % (ref 0.0–0.2)

## 2020-08-14 LAB — TROPONIN I (HIGH SENSITIVITY)
Troponin I (High Sensitivity): <2 ng/L (ref <18)
Troponin I (High Sensitivity): 3 ng/L (ref <18)

## 2020-08-14 LAB — ETHANOL: Alcohol, Ethyl (B): 420 mg/dL (ref <10)

## 2020-08-14 LAB — SALICYLATE LEVEL: Salicylate Lvl: <7 mg/dL — ABNORMAL LOW (ref 7.0–30.0)

## 2020-08-14 MED ORDER — THIAMINE HCL 100 MG/ML IJ SOLN
Freq: Once | INTRAVENOUS | Status: AC
  Filled 2020-08-14: qty 1000

## 2020-08-14 MED ORDER — LORAZEPAM 2 MG PO TABS
2.0000 mg | ORAL_TABLET | Freq: Once | ORAL | Status: AC
  Administered 2020-08-14: 2 mg via ORAL
  Filled 2020-08-14: qty 1

## 2020-08-14 NOTE — ED Notes (Signed)
Patient refused snack pack. I placed a snack bag at the foot of his bed

## 2020-08-14 NOTE — ED Provider Notes (Signed)
-----------------------------------------   11:06 PM on 08/14/2020 -----------------------------------------  Blood pressure (!) 142/88, pulse 90, temperature 98.5 F (36.9 C), temperature source Oral, resp. rate 20, height 6' (1.829 m), weight 88.5 kg, SpO2 98 %.  Assuming care from Dr. Cinda Quest.  In short, Miguel [name] is a 59 y.o. male with a chief complaint of Altered Mental Status .  Refer to the original H&P for additional details.  The current plan of care is to observe until clinically sober.  ----------------------------------------- 6:18 AM on 08/15/2020 -----------------------------------------  Patient now appears clinically sober and will have a safe Ride home.  He did have some vomiting here in the ED which improved with Zofran and Ativan as he is likely having some early withdrawal symptoms.    Blake Divine, MD 08/15/20 [phone]

## 2020-08-14 NOTE — ED Provider Notes (Signed)
[facility] Emergency Department Provider Note   ____________________________________________   First MD Initiated Contact with Patient 08/14/20 1710     (approximate)  I have reviewed the triage vital signs and the nursing notes.   HISTORY  Chief Complaint Altered Mental Status    HPI Miguel [name] is a 59 y.o. male who was going to [facility] because he was shaky and he fell down in the parking lot there skinning his knees and his left forearm.  He denies any suicidal ideation.  He says he is anxious.  He does not remember if he hit his head but he has a small 1 x 1 mm abrasion there as well.  Patient says he is a little bit short of breath now.         Past Medical History:  Diagnosis Date  . Alcohol abuse   . Anxiety   . Asthma   . Family history of breast cancer   . GERD (gastroesophageal reflux disease)   . Gout   . Hx of colonic polyps   . Hypertension   . Kidney stone   . 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 OCD (obsessive compulsive disorder)   . Renal colic     Patient Active Problem List   Diagnosis Date Noted  . Genetic testing 07/15/2020  . 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 Right ankle pain 06/26/2020  . Family history of breast cancer   . Hx of colonic polyps   . Hospital discharge follow-up 06/05/2020  . Kidney function test abnormal 06/05/2020  . Abdominal pain 04/12/2020  . Hypertensive urgency 04/12/2020  . Alcohol withdrawal ([place]) 03/04/2020  . Right-sided chest wall pain 02/28/2020  . Alcohol abuse with alcohol-induced mood disorder ([place]) 12/02/2019  . Moderate episode of recurrent major depressive disorder ([place])   . Health care maintenance 10/26/2019  . Right knee pain 10/26/2019  . Generalized anxiety disorder 10/14/2019  . MDD (major depressive disorder), recurrent severe, without psychosis ([place]) 07/27/2019  . Social anxiety disorder 07/27/2019  . Left-sided chest  wall pain 06/14/2019  . Alcoholic cirrhosis of liver without ascites ([place]) 06/14/2019  . Alcohol abuse 06/14/2019  . AKI (acute kidney injury) ([place]) 05/27/2019  . Alcohol withdrawal syndrome without complication ([place]) 41/36/4383  . Alcoholic intoxication without complication ([place])   . Sepsis ([place]) 03/08/2019  . Breast lump or mass 10/04/2018  . Sepsis secondary to UTI ([place]) 09/14/2018  . Asthma 07/07/2018  . GERD (gastroesophageal reflux disease) 07/07/2018  . OCD (obsessive compulsive disorder) 07/07/2018  . Self-inflicted laceration of left wrist ([place]) 03/10/2018  . Leg hematoma 12/25/2017  . Suicide and self-inflicted injury by cutting and piercing instrument ([place]) 03/10/2017  . Severe recurrent major 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 ([place]) 03/09/2017  . Substance induced mood disorder ([place]) 08/15/2016  . Involuntary commitment 08/15/2016  . Alcohol use disorder, severe, dependence ([place]) 02/05/2016  . Hypertension 12/05/2015  . Tachycardia 12/05/2015  . Gout 11/13/2015  . Chronic back pain 05/02/2015    Past Surgical History:  Procedure Laterality Date  . CHOLECYSTECTOMY  2012  . COLONOSCOPY    . COLONOSCOPY WITH PROPOFOL N/A 05/28/2020   Procedure: COLONOSCOPY WITH PROPOFOL;  Surgeon: [name], MD;  Location[facility] ENDOSCOPY;  Service: Gastroenterology;  Laterality: N/A;  . EXTRACORPOREAL SHOCK WAVE LITHOTRIPSY Left 01/12/2019   Procedure: EXTRACORPOREAL SHOCK WAVE LITHOTRIPSY (ESWL);  Surgeon: Billey Co, MD;  Location: [facility] ORS;  Service:  Urology;  Laterality: Left;  . UPPER GI ENDOSCOPY      Prior to Admission medications   Medication Sig Start Date End Date Taking? Authorizing Provider  albuterol (VENTOLIN HFA) 108 (90 Base) MCG/ACT inhaler Inhale 2 puffs into the lungs every 6 (six) hours as needed. 03/21/20  Yes Clapacs, Madie Reno, MD  allopurinol (ZYLOPRIM) 300 MG tablet Take 1 tablet (300 mg total) by mouth daily. 04/23/20  Yes Iloabachie, Chioma E, NP  ascorbic  acid (VITAMIN C) 500 MG tablet Take by mouth. 08/01/19  Yes [provider]  citalopram (CELEXA) 20 MG tablet Take 1 tablet (20 mg total) by mouth daily. 03/21/20 03/21/21 Yes Clapacs, Madie Reno, MD  colchicine 0.6 MG tablet Take by mouth. 08/01/19  Yes [provider]  Fluticasone-Salmeterol (ADVAIR DISKUS) 100-50 MCG/DOSE AEPB Inhale 1 puff into the lungs 2 (two) times daily. 03/21/20  Yes Clapacs, Madie Reno, MD  folic acid (FOLVITE) 1 MG tablet Take 1 tablet (1 mg total) by mouth daily. 07/16/20  Yes Iloabachie, Chioma E, NP  lisinopril (ZESTRIL) 40 MG tablet Take 1 tablet (40 mg total) by mouth daily. 04/23/20  Yes Iloabachie, Chioma E, NP  omeprazole (PRILOSEC) 10 MG capsule Take 1 capsule (10 mg total) by mouth daily. 04/21/20  Yes Cuthriell, Charline Bills, PA-C  ondansetron (ZOFRAN-ODT) 4 MG disintegrating tablet Take 1 tablet (4 mg total) by mouth every 8 (eight) hours as needed for nausea or vomiting. 04/21/20  Yes Cuthriell, Charline Bills, PA-C  thiamine 100 MG tablet Take 1 tablet (100 mg total) by mouth daily. 03/22/20  Yes Clapacs, Madie Reno, MD  traMADol (ULTRAM) 50 MG tablet Take 50 mg by mouth every 4 (four) hours. 08/07/20  Yes [provider]    Allergies Percocet [oxycodone-acetaminophen]  Family History  Problem Relation Age of Onset  . Alcohol abuse Father   . Breast cancer Mother 34    Social History Social History   Tobacco Use  . Smoking status: Former Smoker    Types: Cigarettes  . Smokeless tobacco: Never Used  . Tobacco comment: quit 30 years ago  Vaping Use  . Vaping Use: Never used  Substance Use Topics  . Alcohol use: Yes    Alcohol/week: 12.0 standard drinks    Types: 12 Shots of liquor per week  . Drug use: No    Review of Systems  Constitutional: No fever/chills Eyes: No visual changes. ENT: No sore throat. Cardiovascular: Denies chest pain. Respiratory: Some shortness of breath. Gastrointestinal: No abdominal pain.  No nausea, no vomiting.  No  diarrhea.  No constipation. Genitourinary: Negative for dysuria. Musculoskeletal: Negative for back pain. Skin: Negative for rash. Neurological: Negative for headaches, focal weakness   ____________________________________________   PHYSICAL EXAM:  VITAL SIGNS: ED Triage Vitals  Enc Vitals Group     BP 08/14/20 1612 91/61     Pulse Rate 08/14/20 1612 95     Resp 08/14/20 1612 20     Temp 08/14/20 1612 98 F (36.7 C)     Temp Source 08/14/20 1612 Oral     SpO2 08/14/20 1612 95 %     Weight 08/14/20 1613 195 lb (88.5 kg)     Height 08/14/20 1613 6' (1.829 m)     Head Circumference --      Peak Flow --      Pain Score 08/14/20 1613 0     Pain Loc --      Pain Edu? --  Excl. in [place]? --     Constitutional: Alert and oriented. Well appearing and in no acute distress. Eyes: Conjunctivae are normal. PER. EOMI. Head: Atraumatic. Nose: No congestion/rhinnorhea. Mouth/Throat: Mucous membranes are moist.  Oropharynx non-erythematous. Neck: No stridor.  Cardiovascular: Normal rate, regular rhythm. Grossly normal heart sounds.  Good peripheral circulation. Respiratory: Normal respiratory effort.  No retractions. Lungs CTAB. Gastrointestinal: Soft and nontender. No distention. No abdominal bruits. No CVA tenderness. Musculoskeletal: No lower extremity tenderness nor edema.  No joint effusions. Neurologic:  Normal speech and language. No gross focal neurologic deficits are appreciated. No gait instability. Skin:  Skin is warm, dry and intact except for skin knees and left wrist. No rash noted.   ____________________________________________   LABS (all labs ordered are listed, but only abnormal results are displayed)  Labs Reviewed  COMPREHENSIVE METABOLIC PANEL - Abnormal; Notable for the following components:      Result Value   CO2 20 (*)    BUN 21 (*)    Calcium 8.4 (*)    AST 13 (*)    All other components within normal limits  ETHANOL - Abnormal; Notable for the  following components:   Alcohol, Ethyl (B) 420 (*)    All other components within normal limits  SALICYLATE LEVEL - Abnormal; Notable for the following components:   Salicylate Lvl <2.1 (*)    All other components within normal limits  ACETAMINOPHEN LEVEL - Abnormal; Notable for the following components:   Acetaminophen (Tylenol), Serum <10 (*)    All other components within normal limits  URINE DRUG SCREEN, QUALITATIVE ([facility] ONLY) - Abnormal; Notable for the following components:   Cannabinoid 50 Ng, Ur [place] POSITIVE (*)    Benzodiazepine, Ur Scrn POSITIVE (*)    All other components within normal limits  RESPIRATORY PANEL BY RT PCR (FLU A&B, COVID)  CBC  BRAIN NATRIURETIC PEPTIDE  TROPONIN I (HIGH SENSITIVITY)  TROPONIN I (HIGH SENSITIVITY)   ____________________________________________  EKG  ____________________________________________  RADIOLOGY  ED MD interpretation: CT of the head read by radiology reviewed by me shows only some sinus mucosal thickening chest x-ray read by radiology reviewed by me does not show any active disease  Official radiology report(s): DG Chest 2 View  Result Date: 08/14/2020 CLINICAL DATA:  Status post fall. EXAM: CHEST - 2 VIEW COMPARISON:  June 10, 2020 FINDINGS: The heart size and mediastinal contours are within normal limits. Both lungs are clear. The visualized skeletal structures are unremarkable. IMPRESSION: No active cardiopulmonary disease. Electronically Signed   By: Virgina Norfolk M.D.   On: 08/14/2020 18:16   CT Head Wo Contrast  Result Date: 08/14/2020 CLINICAL DATA:  Mental status change, unknown cause; fall/altered mental status. EXAM: CT HEAD WITHOUT CONTRAST TECHNIQUE: Contiguous axial images were obtained from the base of the skull through the vertex without intravenous contrast. COMPARISON:  Prior head CT 03/22/2020. FINDINGS: Brain: Stable, mild generalized cerebral atrophy. There is no acute intracranial hemorrhage. No  demarcated cortical infarct. No extra-axial fluid collection. No evidence of intracranial mass. No midline shift. Vascular: No hyperdense vessel.  Atherosclerotic calcifications. Skull: Normal. Negative for fracture or focal lesion. Sinuses/Orbits: Visualized orbits show no acute finding. Tiny mucous retention cyst within the posterior right maxillary sinus. Mild maxillary sinus mucosal thickening at the imaged levels. No significant mastoid effusion IMPRESSION: No evidence of acute intracranial abnormality. Stable, mild generalized cerebral atrophy. Small right ethmoid sinus mucous retention cyst. Mild maxillary sinus mucosal thickening. Electronically Signed   By: Marylyn Ishihara  Golden DO   On: 08/14/2020 16:56    ____________________________________________   PROCEDURES  Procedure(s) performed (including Critical Care):  Procedures   ____________________________________________   INITIAL IMPRESSION / ASSESSMENT AND PLAN / ED COURSE  Patient with an alcohol level of 420.  He is alert initially somewhat restless and then began getting slightly shaky.  Review of him some Ativan to calm down.  He still awake and alert.  We will let him begin to sober up and watch him at least overnight.  Patient is not currently complaining of any pain or suicidal or homicidal ideation.              ____________________________________________   FINAL CLINICAL IMPRESSION(S) / ED DIAGNOSES  Final diagnoses:  Alcoholic intoxication without complication [facility])     ED Discharge Orders    None      *Please note:  Miguel [name] was evaluated in Emergency Department on 08/14/2020 for the symptoms described in the history of present illness. He was evaluated in the context of the global COVID-19 pandemic, which necessitated consideration that the patient might be at risk for infection with the SARS-CoV-2 virus that causes COVID-19. Institutional protocols and algorithms that pertain to the evaluation of patients  at risk for COVID-19 are in a state of rapid change based on information released by regulatory bodies including the CDC and federal and state organizations. These policies and algorithms were followed during the patient's care in the ED.  Some ED evaluations and interventions may be delayed as a result of limited staffing during and the pandemic.*   Note:  This document was prepared using Dragon voice recognition software and may include unintentional dictation errors.    Nena Polio, MD 08/14/20 [phone]

## 2020-08-14 NOTE — BH Assessment (Signed)
Assessment Note  Miguel [name] is an 59 y.o. male.  Per triage note: Pt presents to ED via wheelchair from [facility], per first RN pt found in the parking lot of [place] after falling, pt with noted abrasions to his knees. Pt is noted to be altered on arrival to ED. Pt able to follow some commands, not able to answer many questions.  Pt endorses drinking ETOH this morning, states only had a couple of shots this morning, states last drink between 11a-12p today.  Pt is oriented to self, and place, disoriented to time, intermittently oriented to situation.  Pt now stating that he was coming to [facility] due to tremoring. Pt with noted abnormal movements of his hands in triage, pt constantly moving his hands.    Pt presented to the ED voluntarily due to alcohol intoxication. Patient presented with abrasions on his left arm, due to falling while under the influence of alcohol earlier today. Patient has a depressed mood and was tremulous upon interview. Pt reported that his tremors are due to anxiety. The pt presents with logical and coherent speech. Pt is tearful when explaining his symptoms of depression and his limited quality of life. Pt reported symptoms of hopelessness, worthlessness, isolation, and insomnia. Pt reports that he is only able to sleep after drinking alcohol. The patient explained that he is the primary care provider of his wife of 34 years. Reports his drinking is straining his marriage. The patient reported no family support. Pt explained that he is not connected to a therapist or psychiatrist, however he is interested in treatment. Pt also explained that he would like to begin taking depression medications. The patient was in scrubs and his eye contact was poor. Patient's speech slurred and he was fixated about not having a job or anything to do. Pt reported that he sees shadows and spots when probed about hallucinations. Denies suicidal ideations, homicidal ideations, or paranoia.    Diagnosis: Alcohol  abuse  Past Medical History:  Past Medical History:  Diagnosis Date  . Alcohol abuse   . Anxiety   . Asthma   . Family history of breast cancer   . GERD (gastroesophageal reflux disease)   . Gout   . Hx of colonic polyps   . Hypertension   . Kidney stone   . 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 OCD (obsessive compulsive disorder)   . Renal colic     Past Surgical History:  Procedure Laterality Date  . CHOLECYSTECTOMY  2012  . COLONOSCOPY    . COLONOSCOPY WITH PROPOFOL N/A 05/28/2020   Procedure: COLONOSCOPY WITH PROPOFOL;  Surgeon: [name], MD;  Location: [facility] ENDOSCOPY;  Service: Gastroenterology;  Laterality: N/A;  . EXTRACORPOREAL SHOCK WAVE LITHOTRIPSY Left 01/12/2019   Procedure: EXTRACORPOREAL SHOCK WAVE LITHOTRIPSY (ESWL);  Surgeon: Billey Co, MD;  Location: [facility] ORS;  Service: Urology;  Laterality: Left;  . UPPER GI ENDOSCOPY      Family History:  Family History  Problem Relation Age of Onset  . Alcohol abuse Father   . Breast cancer Mother 13    Social History:  reports that he has quit smoking. His smoking use included cigarettes. He has never used smokeless tobacco. He reports current alcohol use of about 12.0 standard drinks of alcohol per week. He reports that he does not use drugs.  Additional Social History:  Alcohol / Drug Use Pain Medications: See PTA Prescriptions: See PTA History of alcohol / drug use?: Yes  Substance #1 Name of Substance 1: Alcohol  CIWA: CIWA-Ar BP: (!) 142/88 Pulse Rate: 90 Nausea and Vomiting: mild nausea with no vomiting Tactile Disturbances: very mild itching, pins and needles, burning or numbness Tremor: two Auditory Disturbances: not present Paroxysmal Sweats: barely perceptible sweating, palms moist Visual Disturbances: very mild sensitivity Anxiety: mildly anxious Headache, Fullness in Head: very mild Agitation: normal activity Orientation and Clouding of Sensorium: oriented and can do  serial additions CIWA-Ar Total: 8 COWS:    Allergies:  Allergies  Allergen Reactions  . Percocet [Oxycodone-Acetaminophen] Nausea And Vomiting    Home Medications: (Not in a hospital admission)   OB/GYN Status:  No LMP for male patient.  General Assessment Data Location of Assessment: [facility] ED TTS Assessment: In system Is this a Tele or Face-to-Face Assessment?: Face-to-Face Is this an Initial Assessment or a Re-assessment for this encounter?: Initial Assessment Patient Accompanied by:: N/A Language Other than English: No Living Arrangements: Other (Comment) What gender do you identify as?: Male Date Telepsych consult ordered in [facility]: 08/14/20 Time Telepsych consult ordered in [facility]: 1857 Marital status: Married [place] name: n/a Pregnancy Status: No Living Arrangements: Spouse/significant other Can pt return to current living arrangement?: Yes Admission Status: Voluntary Is patient capable of signing voluntary admission?: Yes Referral Source: Self/Family/Friend Insurance type: none  Medical Screening Exam (Level Green) Medical Exam completed: Yes  Crisis Care Plan Living Arrangements: Spouse/significant other Legal Guardian: Other: (Self) Name of Psychiatrist: n/a Name of Therapist: n/a  Education Status Is patient currently in school?: No Is the patient employed, unemployed or receiving disability?: Unemployed  Risk to self with the past 6 months Suicidal Ideation: No Has patient been a risk to self within the past 6 months prior to admission? : No Suicidal Intent: No Has patient had any suicidal intent within the past 6 months prior to admission? : No Is patient at risk for suicide?: No Suicidal Plan?: No Has patient had any suicidal plan within the past 6 months prior to admission? : No Access to Means: No What has been your use of drugs/alcohol within the last 12 months?: Alcohol Previous Attempts/Gestures: No How many times?: 0 Other Self Harm Risks:  Depression Triggers for Past Attempts: None known Intentional Self Injurious Behavior: None Family Suicide History: Unknown Recent stressful life event(s): Conflict (Comment) Persecutory voices/beliefs?: No Depression: Yes Depression Symptoms: Feeling worthless/self pity, Loss of interest in usual pleasures, Guilt, Tearfulness, Despondent, Isolating, Insomnia Substance abuse history and/or treatment for substance abuse?: Yes Suicide prevention information given to non-admitted patients: Not applicable  Risk to Others within the past 6 months Homicidal Ideation: No Does patient have any lifetime risk of violence toward others beyond the six months prior to admission? : No Thoughts of Harm to Others: No Current Homicidal Intent: No Current Homicidal Plan: No Access to Homicidal Means: No Identified Victim: n/a History of harm to others?: No Assessment of Violence: None Noted Violent Behavior Description: n/a Does patient have access to weapons?: No Criminal Charges Pending?: No Does patient have a court date: No Is patient on probation?: No  Psychosis Hallucinations: Visual Delusions: None noted  Mental Status Report Appearance/Hygiene: Disheveled, Poor hygiene Eye Contact: Fair Motor Activity: Tremors Speech: Slurred, Logical/coherent Level of Consciousness: Quiet/awake Mood: Despair, Depressed, Anxious Affect: Anxious, Depressed Anxiety Level: Moderate Thought Processes: Coherent, Relevant Judgement: Impaired Orientation: Person, Place, Time, Situation Obsessive Compulsive Thoughts/Behaviors: None  Cognitive Functioning Concentration: Normal Memory: Recent Intact, Remote Intact Is patient IDD: No Insight: Poor Impulse Control: Poor Appetite:  Poor Have you had any weight changes? : No Change Sleep: Decreased Total Hours of Sleep:  (Pt unable to recall) Vegetative Symptoms: None  ADLScreening [facility] Assessment Services) Patient's cognitive ability adequate to  safely complete daily activities?: Yes Patient able to express need for assistance with ADLs?: Yes Independently performs ADLs?: Yes (appropriate for developmental age)  Prior Inpatient Therapy Prior Inpatient Therapy: Yes Prior Therapy Dates: 07/2019, 03/2020 Prior Therapy Facilty/Provider(s): [facility] Reason for Treatment: Depression  Prior Outpatient Therapy Prior Outpatient Therapy: Yes Prior Therapy Dates:  (Pt unable to recall) Prior Therapy Facilty/Provider(s): [facility] Reason for Treatment: Alcohol abuse Does patient have an ACCT team?: No Does patient have Intensive In-House Services?  : No Does patient have [facility] services? : No Does patient have P4CC services?: No  ADL Screening (condition at time of admission) Patient's cognitive ability adequate to safely complete daily activities?: Yes Is the patient deaf or have difficulty hearing?: No Does the patient have difficulty seeing, even when wearing glasses/contacts?: No Does the patient have difficulty concentrating, remembering, or making decisions?: No Patient able to express need for assistance with ADLs?: Yes Does the patient have difficulty dressing or bathing?: No Independently performs ADLs?: Yes (appropriate for developmental age) Does the patient have difficulty walking or climbing stairs?: No Weakness of Legs: None Weakness of Arms/Hands: None  Home Assistive Devices/Equipment Home Assistive Devices/Equipment: None  Therapy Consults (therapy consults require a physician order) PT Evaluation Needed: No OT Evalulation Needed: No SLP Evaluation Needed: No Abuse/Neglect Assessment (Assessment to be complete while patient is alone) Abuse/Neglect Assessment Can Be Completed: Yes Physical Abuse: Denies Verbal Abuse: Denies Sexual Abuse: Denies Exploitation of patient/patient's resources: Denies Self-Neglect: Denies Values / Beliefs Cultural Requests During Hospitalization: None Spiritual Requests During  Hospitalization: None Consults Spiritual Care Consult Needed: No Transition of Care Team Consult Needed: No Advance Directives (For Healthcare) Does Patient Have a Medical Advance Directive?: No Would patient like information on creating a medical advance directive?: No - Patient declined          Disposition: Pt can be discharged with outpatient resources to address his alcohol addiction. Disposition Initial Assessment Completed for this Encounter: Yes  On Site Evaluation by:   Reviewed with Physician:    Kathi Ludwig 08/14/2020 9:48 PM

## 2020-08-14 NOTE — ED Triage Notes (Signed)
Pt presents to ED via wheelchair from [facility], per first RN pt found in the parking lot of [place] after falling, pt with noted abrasions to his knees. Pt is noted to be altered on arrival to ED. Pt able to follow some commands, not able to answer many questions.   Pt endorses drinking ETOH this morning, states only had a couple of shots this morning, states last drink between 11a-12p today.   Pt is oriented to self, and place, disoriented to time, intermittently oriented to situation.   Pt now stating that he was coming to [facility] due to tremoring. Pt with noted abnormal movements of his hands in triage, pt constantly moving his hands.

## 2020-08-15 MED ORDER — LORAZEPAM 2 MG PO TABS
2.0000 mg | ORAL_TABLET | Freq: Once | ORAL | Status: AC
  Administered 2020-08-15: 2 mg via ORAL
  Filled 2020-08-15: qty 1

## 2020-08-15 MED ORDER — ONDANSETRON HCL 4 MG/2ML IJ SOLN
4.0000 mg | Freq: Once | INTRAMUSCULAR | Status: AC
  Administered 2020-08-15: 4 mg via INTRAVENOUS
  Filled 2020-08-15: qty 2

## 2020-08-15 MED ORDER — ONDANSETRON 4 MG PO TBDP
ORAL_TABLET | ORAL | Status: AC
  Administered 2020-08-15: 4 mg
  Filled 2020-08-15: qty 1

## 2020-08-15 NOTE — ED Notes (Signed)
Pt vomited approx 142mL.

## 2020-08-15 NOTE — ED Notes (Signed)
Patient discharged to home per MD order. Patient in stable condition, and deemed medically cleared by ED provider for discharge. Discharge instructions reviewed with patient/family using "Teach Back"; verbalized understanding of medication education and administration, and information about follow-up care. Denies further concerns. ° °

## 2020-08-18 ENCOUNTER — Other Ambulatory Visit: Payer: Self-pay

## 2020-08-18 ENCOUNTER — Encounter: Payer: Self-pay | Admitting: Emergency Medicine

## 2020-08-18 ENCOUNTER — Emergency Department: Payer: Medicaid Other

## 2020-08-18 DIAGNOSIS — F10231 Alcohol dependence with withdrawal delirium: Secondary | ICD-10-CM | POA: Insufficient documentation

## 2020-08-18 DIAGNOSIS — F419 Anxiety disorder, unspecified: Secondary | ICD-10-CM | POA: Insufficient documentation

## 2020-08-18 DIAGNOSIS — J45909 Unspecified asthma, uncomplicated: Secondary | ICD-10-CM | POA: Insufficient documentation

## 2020-08-18 DIAGNOSIS — Z79899 Other long term (current) drug therapy: Secondary | ICD-10-CM | POA: Insufficient documentation

## 2020-08-18 DIAGNOSIS — Z87891 Personal history of nicotine dependence: Secondary | ICD-10-CM | POA: Insufficient documentation

## 2020-08-18 DIAGNOSIS — I10 Essential (primary) hypertension: Secondary | ICD-10-CM | POA: Insufficient documentation

## 2020-08-18 LAB — URINALYSIS, COMPLETE (UACMP) WITH MICROSCOPIC
Bilirubin Urine: NEGATIVE
Glucose, UA: NEGATIVE mg/dL
Hgb urine dipstick: NEGATIVE
Ketones, ur: 5 mg/dL — AB
Nitrite: NEGATIVE
Protein, ur: NEGATIVE mg/dL
Specific Gravity, Urine: 1.023 (ref 1.005–1.030)
pH: 5 (ref 5.0–8.0)

## 2020-08-18 LAB — URINE DRUG SCREEN, QUALITATIVE (ARMC ONLY)
Amphetamines, Ur Screen: NOT DETECTED
Barbiturates, Ur Screen: NOT DETECTED
Benzodiazepine, Ur Scrn: POSITIVE — AB
Cannabinoid 50 Ng, Ur ~~LOC~~: POSITIVE — AB
Cocaine Metabolite,Ur ~~LOC~~: NOT DETECTED
MDMA (Ecstasy)Ur Screen: NOT DETECTED
Methadone Scn, Ur: NOT DETECTED
Opiate, Ur Screen: NOT DETECTED
Phencyclidine (PCP) Ur S: NOT DETECTED
Tricyclic, Ur Screen: NOT DETECTED

## 2020-08-18 LAB — COMPREHENSIVE METABOLIC PANEL
ALT: 18 U/L (ref 0–44)
AST: 17 U/L (ref 15–41)
Albumin: 4.2 g/dL (ref 3.5–5.0)
Alkaline Phosphatase: 66 U/L (ref 38–126)
Anion gap: 10 (ref 5–15)
BUN: 13 mg/dL (ref 6–20)
CO2: 24 mmol/L (ref 22–32)
Calcium: 9.3 mg/dL (ref 8.9–10.3)
Chloride: 103 mmol/L (ref 98–111)
Creatinine, Ser: 0.95 mg/dL (ref 0.61–1.24)
GFR calc Af Amer: >60 mL/min (ref >60)
GFR calc non Af Amer: >60 mL/min (ref >60)
Glucose, Bld: 117 mg/dL — ABNORMAL HIGH (ref 70–99)
Potassium: 3.7 mmol/L (ref 3.5–5.1)
Sodium: 137 mmol/L (ref 135–145)
Total Bilirubin: 1.4 mg/dL — ABNORMAL HIGH (ref 0.3–1.2)
Total Protein: 7.1 g/dL (ref 6.5–8.1)

## 2020-08-18 LAB — CBC WITH DIFFERENTIAL/PLATELET
Abs Immature Granulocytes: 0.01 10*3/uL (ref 0.00–0.07)
Basophils Absolute: 0.1 10*3/uL (ref 0.0–0.1)
Basophils Relative: 1 %
Eosinophils Absolute: 0.1 10*3/uL (ref 0.0–0.5)
Eosinophils Relative: 1 %
HCT: 38.5 % — ABNORMAL LOW (ref 39.0–52.0)
Hemoglobin: 12.9 g/dL — ABNORMAL LOW (ref 13.0–17.0)
Immature Granulocytes: 0 %
Lymphocytes Relative: 22 %
Lymphs Abs: 1.5 10*3/uL (ref 0.7–4.0)
MCH: 29.7 pg (ref 26.0–34.0)
MCHC: 33.5 g/dL (ref 30.0–36.0)
MCV: 88.5 fL (ref 80.0–100.0)
Monocytes Absolute: 0.8 10*3/uL (ref 0.1–1.0)
Monocytes Relative: 12 %
Neutro Abs: 4.5 10*3/uL (ref 1.7–7.7)
Neutrophils Relative %: 64 %
Platelets: 231 10*3/uL (ref 150–400)
RBC: 4.35 MIL/uL (ref 4.22–5.81)
RDW: 13.9 % (ref 11.5–15.5)
WBC: 7 10*3/uL (ref 4.0–10.5)
nRBC: 0 % (ref 0.0–0.2)

## 2020-08-18 LAB — ETHANOL: Alcohol, Ethyl (B): <10 mg/dL (ref <10)

## 2020-08-18 LAB — LIPASE, BLOOD: Lipase: 28 U/L (ref 11–51)

## 2020-08-18 MED ORDER — LORAZEPAM 2 MG PO TABS
2.0000 mg | ORAL_TABLET | Freq: Once | ORAL | Status: AC
  Administered 2020-08-18: 2 mg via ORAL
  Filled 2020-08-18: qty 1

## 2020-08-18 NOTE — ED Triage Notes (Addendum)
Pt here for shaking and NVD.  Pt has low grade temp.  Has not drank alcohol since Wednesday.  Generalized abdominal pain.  Oriented at this time.  Talked about typical 72 hours to withdrawal from the alcohol but pt reports sometimes it takes him 5-6 days.  Pain to lateral ankle; reports problem with ankle already.

## 2020-08-19 ENCOUNTER — Other Ambulatory Visit: Payer: Self-pay | Admitting: Emergency Medicine

## 2020-08-19 ENCOUNTER — Emergency Department
Admission: EM | Admit: 2020-08-19 | Discharge: 2020-08-19 | Disposition: A | Discharge status: Home / Self Care | Payer: Medicaid Other | Attending: Emergency Medicine | Admitting: Emergency Medicine

## 2020-08-19 DIAGNOSIS — F10239 Alcohol dependence with withdrawal, unspecified: Secondary | ICD-10-CM

## 2020-08-19 DIAGNOSIS — F419 Anxiety disorder, unspecified: Secondary | ICD-10-CM

## 2020-08-19 DIAGNOSIS — R251 Tremor, unspecified: Secondary | ICD-10-CM

## 2020-08-19 DIAGNOSIS — F10939 Alcohol use, unspecified with withdrawal, unspecified: Secondary | ICD-10-CM

## 2020-08-19 MED ORDER — LORAZEPAM 1 MG PO TABS
1.0000 mg | ORAL_TABLET | Freq: Once | ORAL | Status: AC
  Administered 2020-08-19: 1 mg via ORAL
  Filled 2020-08-19: qty 1

## 2020-08-19 MED ORDER — ONDANSETRON 4 MG PO TBDP
4.0000 mg | ORAL_TABLET | Freq: Once | ORAL | Status: AC
  Administered 2020-08-19: 4 mg via ORAL
  Filled 2020-08-19: qty 1

## 2020-08-19 MED ORDER — ONDANSETRON 4 MG PO TBDP: ORAL_TABLET | ORAL | 0 refills | Status: DC

## 2020-08-19 NOTE — ED Notes (Signed)
Patient reports had several hours of vomiting and shaking before coming to the ED around 5 pm Sunday.  Reports he has not vomited since being at the ED and shaking has stopped.  Patient has not had alcohol since Wednesday as he is trying to stop.  Patient in no acute distress at this time.

## 2020-08-19 NOTE — Discharge Instructions (Signed)
Fortunately your work-up is reassuring tonight.  Your ankle fracture is continuing to heal.  You may be having some symptoms of alcohol withdrawal but overall you are doing very well even after such a long wait in the emergency department.  Please continue to avoid alcohol and take your regular medications.  Follow-up with your regular provider at the next available opportunity.

## 2020-08-19 NOTE — ED Provider Notes (Signed)
[facility] Emergency Department Provider Note  ____________________________________________   First MD Initiated Contact with Patient 08/19/20 [phone]     (approximate)  I have reviewed the triage vital signs and the nursing notes.   HISTORY  Chief Complaint Shaking    HPI Miguel [name] is a 59 y.o. male well-known to the emergency department for frequent visits associated with his alcohol abuse and anxiety.  He presents tonight by EMS for evaluation of shaking.  He said that he has not had anything to drink for about 5 days but was feeling shaky and anxious tonight.  He thought he should get checked out to make sure everything is okay and thought that he might need some Ativan.  He got some Ativan in triage and has felt better since then even though he says he has a little bit of shaking of his hands even though it has been over 9 hours since he checked in due to overwhelming ED and hospital patient volumes.  He had some vomiting a few days ago but not recently.  He was able to take his regular medications and vitamins today/yesterday.  He describes his symptoms as moderate and nothing in particular made them better or worse but he feels like he is doing better.  He denies other drug use.         Past Medical History:  Diagnosis Date  . Alcohol abuse   . Anxiety   . Asthma   . Family history of breast cancer   . GERD (gastroesophageal reflux disease)   . Gout   . Hx of colonic polyps   . Hypertension   . Kidney stone   . 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 OCD (obsessive compulsive disorder)   . Renal colic     Patient Active Problem List   Diagnosis Date Noted  . Genetic testing 07/15/2020  . 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 Right ankle pain 06/26/2020  . Family history of breast cancer   . Hx of colonic polyps   . Hospital discharge follow-up 06/05/2020  . Kidney function test abnormal 06/05/2020  .  Abdominal pain 04/12/2020  . Hypertensive urgency 04/12/2020  . Alcohol withdrawal ([place]) 03/04/2020  . Right-sided chest wall pain 02/28/2020  . Alcohol abuse with alcohol-induced mood disorder ([place]) 12/02/2019  . Moderate episode of recurrent major depressive disorder ([place])   . Health care maintenance 10/26/2019  . Right knee pain 10/26/2019  . Generalized anxiety disorder 10/14/2019  . MDD (major depressive disorder), recurrent severe, without psychosis ([place]) 07/27/2019  . Social anxiety disorder 07/27/2019  . Left-sided chest wall pain 06/14/2019  . Alcoholic cirrhosis of liver without ascites ([place]) 06/14/2019  . Alcohol abuse 06/14/2019  . AKI (acute kidney injury) ([place]) 05/27/2019  . Alcohol withdrawal syndrome without complication ([place]) 84/66/5993  . Alcoholic intoxication without complication ([place])   . Sepsis ([place]) 03/08/2019  . Breast lump or mass 10/04/2018  . Sepsis secondary to UTI ([place]) 09/14/2018  . Asthma 07/07/2018  . GERD (gastroesophageal reflux disease) 07/07/2018  . OCD (obsessive compulsive disorder) 07/07/2018  . Self-inflicted laceration of left wrist ([place]) 03/10/2018  . Leg hematoma 12/25/2017  . Suicide and self-inflicted injury by cutting and piercing instrument ([place]) 03/10/2017  . Severe recurrent major 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 ([place]) 03/09/2017  . Substance induced mood disorder ([place]) 08/15/2016  . Involuntary commitment 08/15/2016  . Alcohol use disorder, severe, dependence ([place]) 02/05/2016  .  Hypertension 12/05/2015  . Tachycardia 12/05/2015  . Gout 11/13/2015  . Chronic back pain 05/02/2015    Past Surgical History:  Procedure Laterality Date  . CHOLECYSTECTOMY  2012  . COLONOSCOPY    . COLONOSCOPY WITH PROPOFOL N/A 05/28/2020   Procedure: COLONOSCOPY WITH PROPOFOL;  Surgeon: [name], MD;  [facility] ENDOSCOPY;  Service: Gastroenterology;  Laterality: N/A;  . EXTRACORPOREAL SHOCK WAVE LITHOTRIPSY Left 01/12/2019   Procedure:  EXTRACORPOREAL SHOCK WAVE LITHOTRIPSY (ESWL);  Surgeon: Billey Co, MD;  Location: [facility] ORS;  Service: Urology;  Laterality: Left;  . UPPER GI ENDOSCOPY      Prior to Admission medications   Medication Sig Start Date End Date Taking? Authorizing Provider  albuterol (VENTOLIN HFA) 108 (90 Base) MCG/ACT inhaler Inhale 2 puffs into the lungs every 6 (six) hours as needed. 03/21/20   Clapacs, Madie Reno, MD  allopurinol (ZYLOPRIM) 300 MG tablet Take 1 tablet (300 mg total) by mouth daily. 04/23/20   Iloabachie, Chioma E, NP  ascorbic acid (VITAMIN C) 500 MG tablet Take by mouth. 08/01/19   [provider]  citalopram (CELEXA) 20 MG tablet Take 1 tablet (20 mg total) by mouth daily. 03/21/20 03/21/21  Clapacs, Madie Reno, MD  colchicine 0.6 MG tablet Take by mouth. 08/01/19   [provider]  Fluticasone-Salmeterol (ADVAIR DISKUS) 100-50 MCG/DOSE AEPB Inhale 1 puff into the lungs 2 (two) times daily. 03/21/20   Clapacs, Madie Reno, MD  folic acid (FOLVITE) 1 MG tablet Take 1 tablet (1 mg total) by mouth daily. 07/16/20   Iloabachie, Chioma E, NP  lisinopril (ZESTRIL) 40 MG tablet Take 1 tablet (40 mg total) by mouth daily. 04/23/20   Iloabachie, Chioma E, NP  omeprazole (PRILOSEC) 10 MG capsule Take 1 capsule (10 mg total) by mouth daily. 04/21/20   Cuthriell, Charline Bills, PA-C  ondansetron (ZOFRAN ODT) 4 MG disintegrating tablet Allow 1-2 tablets to dissolve in your mouth every 8 hours as needed for nausea/vomiting 08/19/20   Hinda Kehr, MD  thiamine 100 MG tablet Take 1 tablet (100 mg total) by mouth daily. 03/22/20   Clapacs, Madie Reno, MD  traMADol (ULTRAM) 50 MG tablet Take 50 mg by mouth every 4 (four) hours. 08/07/20   [provider]    Allergies Percocet [oxycodone-acetaminophen]  Family History  Problem Relation Age of Onset  . Alcohol abuse Father   . Breast cancer Mother 10    Social History Social History   Tobacco Use  . Smoking status: Former Smoker    Types: Cigarettes  .  Smokeless tobacco: Never Used  . Tobacco comment: quit 30 years ago  Vaping Use  . Vaping Use: Never used  Substance Use Topics  . Alcohol use: Yes    Alcohol/week: 12.0 standard drinks    Types: 12 Shots of liquor per week  . Drug use: No    Review of Systems Constitutional: No fever/chills Eyes: No visual changes. ENT: No sore throat. Cardiovascular: Denies chest pain. Respiratory: Denies shortness of breath. Gastrointestinal: No abdominal pain.  Nausea and vomiting a few days ago, but not recently.  No diarrhea.  No constipation. Genitourinary: Negative for dysuria. Musculoskeletal: Negative for neck pain.  Negative for back pain. Integumentary: Negative for rash. Neurological: Some shaking.  Negative for headaches, focal weakness or numbness.   ____________________________________________   PHYSICAL EXAM:  VITAL SIGNS: ED Triage Vitals [08/18/20 1755]  Enc Vitals Group     BP (!) 143/86     Pulse Rate Marland Kitchen)  102     Resp 18     Temp 100 F (37.8 C)     Temp Source Oral     SpO2 96 %     Weight 88.5 kg (195 lb)     Height 1.829 m (6')     Head Circumference      Peak Flow      Pain Score 8     Pain Loc      Pain Edu?      Excl. in [place]?     Constitutional: Alert and oriented.  Eyes: Conjunctivae are normal.  Head: Atraumatic. Nose: No congestion/rhinnorhea. Mouth/Throat: Patient is wearing a mask. Neck: No stridor.  No meningeal signs.   Cardiovascular: Normal rate, regular rhythm. Good peripheral circulation. Grossly normal heart sounds. Respiratory: Normal respiratory effort.  No retractions. Gastrointestinal: Soft and nontender. No distention.  Musculoskeletal: No lower extremity tenderness nor edema. No gross deformities of extremities. Neurologic:  Normal speech and language. No gross focal neurologic deficits are appreciated.  Only very minor tremor in his hands is noted and that seems to be somewhat volitional. Skin:  Skin is warm, dry and  intact. Psychiatric: Mood and affect are normal. Speech and behavior are normal.  ____________________________________________   LABS (all labs ordered are listed, but only abnormal results are displayed)  Labs Reviewed  CBC WITH DIFFERENTIAL/PLATELET - Abnormal; Notable for the following components:      Result Value   Hemoglobin 12.9 (*)    HCT 38.5 (*)    All other components within normal limits  COMPREHENSIVE METABOLIC PANEL - Abnormal; Notable for the following components:   Glucose, Bld 117 (*)    Total Bilirubin 1.4 (*)    All other components within normal limits  URINALYSIS, COMPLETE (UACMP) WITH MICROSCOPIC - Abnormal; Notable for the following components:   Color, Urine AMBER (*)    APPearance HAZY (*)    Ketones, ur 5 (*)    Leukocytes,Ua TRACE (*)    Bacteria, UA RARE (*)    All other components within normal limits  URINE DRUG SCREEN, QUALITATIVE ([facility] ONLY) - Abnormal; Notable for the following components:   Cannabinoid 50 Ng, Ur [place] POSITIVE (*)    Benzodiazepine, Ur Scrn POSITIVE (*)    All other components within normal limits  LIPASE, BLOOD  ETHANOL   ____________________________________________  EKG  None - EKG not ordered by ED physician ____________________________________________  RADIOLOGY Ursula Alert, personally viewed and evaluated these images (plain radiographs) as part of my medical decision making, as well as reviewing the written report by the radiologist.  ED MD interpretation: No acute process in his chest x-ray.  Ongoing healing of bimalleolar ankle fracture.  Official radiology report(s): DG Chest 2 View  Result Date: 08/18/2020 CLINICAL DATA:  Pt states whacked lateral side of ankle on bed this morning. Having lateral side right ankle pain. Hx of fracture. EXAM: CHEST - 2 VIEW COMPARISON:  Chest radiograph 08/14/2020 FINDINGS: The cardiomediastinal contours are within normal limits. The lungs are clear. No pneumothorax or pleural  effusion. No acute finding in the visualized skeleton. IMPRESSION: No acute cardiopulmonary process. Electronically Signed   By: Audie Pinto M.D.   On: 08/18/2020 18:41   DG Ankle Complete Right  Result Date: 08/18/2020 CLINICAL DATA:  Pt states whacked lateral side of ankle on bed this morning. Having lateral side right ankle pain. Hx of fracture. EXAM: RIGHT ANKLE - COMPLETE 3+ VIEW COMPARISON:  Right ankle radiographs 06/11/2020 FINDINGS: Redemonstrated  mildly displaced bimalleolar fractures. No significant change in fracture alignment. There is sclerosis and bony proliferation in the distal fibula consistent with ongoing healing. Fracture lucency persists. There are irregular margins at the fracture site in the medial malleolus consistent with healing though fracture lucency remains. No new fracture identified. There is regional soft tissue swelling. IMPRESSION: Ongoing healing of bimalleolar ankle fractures. No significant change in fracture alignment. No new fracture identified. Electronically Signed   By: Audie Pinto M.D.   On: 08/18/2020 18:40    ____________________________________________   PROCEDURES   Procedure(s) performed (including Critical Care):  Procedures   ____________________________________________   INITIAL IMPRESSION / MDM / [place] / ED COURSE  As part of my medical decision making, I reviewed the following data within the [place] notes reviewed and incorporated, Labs reviewed , Old chart reviewed, Radiograph reviewed  and Notes from prior ED visits and reviewed [place] controlled substance database.   Differential diagnosis includes, but is not limited to, alcohol withdrawal, anxiety, electrolyte or metabolic abnormality.  The patient needed some reassurance that his evaluation work-up was good tonight.  He has no clinical signs of alcohol withdrawal.  He has a little bit of shaking in his hands but it is  only when he holds up his hands to show me that he is shaking.  When he is at rest and giving his history his hands are completely stable.  He received some Ativan in triage but given his long history of anxiety as well as alcohol abuse, I am giving him Ativan 1 mg by mouth now which I believe will help reassure him and make him feel better and more likely to continue his good work towards sobriety and discharge home to follow-up as an outpatient.  His labs are all within normal limits and generally reassuring.  He has no sign of an acute or emergent medical condition at this time and he is comfortable with the plan for discharge and outpatient follow-up.           ____________________________________________  FINAL CLINICAL IMPRESSION(S) / ED DIAGNOSES  Final diagnoses:  Shaking  Anxiety  Alcohol withdrawal syndrome with complication ([place])     MEDICATIONS GIVEN DURING THIS VISIT:  Medications  LORazepam (ATIVAN) tablet 1 mg (has no administration in time range)  ondansetron (ZOFRAN-ODT) disintegrating tablet 4 mg (has no administration in time range)  LORazepam (ATIVAN) tablet 2 mg (2 mg Oral Given 08/18/20 1811)     ED Discharge Orders         Ordered    ondansetron (ZOFRAN ODT) 4 MG disintegrating tablet        08/19/20 0253          *Please note:  Miguel [name] was evaluated in Emergency Department on 08/19/2020 for the symptoms described in the history of present illness. He was evaluated in the context of the global COVID-19 pandemic, which necessitated consideration that the patient might be at risk for infection with the SARS-CoV-2 virus that causes COVID-19. Institutional protocols and algorithms that pertain to the evaluation of patients at risk for COVID-19 are in a state of rapid change based on information released by regulatory bodies including the CDC and federal and state organizations. These policies and algorithms were followed during the patient's care in the ED.   Some ED evaluations and interventions may be delayed as a result of limited staffing during and after the pandemic.*  Note:  This document was prepared  using Systems analyst and may include unintentional dictation errors.   Hinda Kehr, MD 08/19/20 [phone]

## 2020-08-28 ENCOUNTER — Other Ambulatory Visit: Payer: Self-pay

## 2020-08-28 ENCOUNTER — Emergency Department
Admission: EM | Admit: 2020-08-28 | Discharge: 2020-08-28 | Disposition: A | Discharge status: Home / Self Care | Payer: Self-pay | Attending: Emergency Medicine | Admitting: Emergency Medicine

## 2020-08-28 ENCOUNTER — Encounter: Payer: Self-pay | Admitting: Intensive Care

## 2020-08-28 DIAGNOSIS — F1010 Alcohol abuse, uncomplicated: Secondary | ICD-10-CM | POA: Insufficient documentation

## 2020-08-28 DIAGNOSIS — Z79899 Other long term (current) drug therapy: Secondary | ICD-10-CM | POA: Insufficient documentation

## 2020-08-28 DIAGNOSIS — Z87891 Personal history of nicotine dependence: Secondary | ICD-10-CM | POA: Insufficient documentation

## 2020-08-28 DIAGNOSIS — F419 Anxiety disorder, unspecified: Secondary | ICD-10-CM | POA: Insufficient documentation

## 2020-08-28 DIAGNOSIS — J45909 Unspecified asthma, uncomplicated: Secondary | ICD-10-CM | POA: Insufficient documentation

## 2020-08-28 DIAGNOSIS — I10 Essential (primary) hypertension: Secondary | ICD-10-CM | POA: Insufficient documentation

## 2020-08-28 LAB — URINE DRUG SCREEN, QUALITATIVE (ARMC ONLY)
Amphetamines, Ur Screen: NOT DETECTED
Barbiturates, Ur Screen: NOT DETECTED
Benzodiazepine, Ur Scrn: POSITIVE — AB
Cannabinoid 50 Ng, Ur ~~LOC~~: POSITIVE — AB
Cocaine Metabolite,Ur ~~LOC~~: NOT DETECTED
MDMA (Ecstasy)Ur Screen: NOT DETECTED
Methadone Scn, Ur: NOT DETECTED
Opiate, Ur Screen: NOT DETECTED
Phencyclidine (PCP) Ur S: NOT DETECTED
Tricyclic, Ur Screen: NOT DETECTED

## 2020-08-28 LAB — COMPREHENSIVE METABOLIC PANEL
ALT: 12 U/L (ref 0–44)
AST: 12 U/L — ABNORMAL LOW (ref 15–41)
Albumin: 4 g/dL (ref 3.5–5.0)
Alkaline Phosphatase: 63 U/L (ref 38–126)
Anion gap: 10 (ref 5–15)
BUN: 16 mg/dL (ref 6–20)
CO2: 22 mmol/L (ref 22–32)
Calcium: 9.2 mg/dL (ref 8.9–10.3)
Chloride: 106 mmol/L (ref 98–111)
Creatinine, Ser: 1.04 mg/dL (ref 0.61–1.24)
GFR, Estimated: >60 mL/min (ref >60)
Glucose, Bld: 120 mg/dL — ABNORMAL HIGH (ref 70–99)
Potassium: 3.8 mmol/L (ref 3.5–5.1)
Sodium: 138 mmol/L (ref 135–145)
Total Bilirubin: 0.5 mg/dL (ref 0.3–1.2)
Total Protein: 6.6 g/dL (ref 6.5–8.1)

## 2020-08-28 LAB — URINALYSIS, COMPLETE (UACMP) WITH MICROSCOPIC
Bacteria, UA: NONE SEEN
Glucose, UA: NEGATIVE mg/dL
Hgb urine dipstick: NEGATIVE
Ketones, ur: NEGATIVE mg/dL
Nitrite: NEGATIVE
Protein, ur: NEGATIVE mg/dL
Specific Gravity, Urine: 1.019 (ref 1.005–1.030)
pH: 6 (ref 5.0–8.0)

## 2020-08-28 LAB — LIPASE, BLOOD: Lipase: 34 U/L (ref 11–51)

## 2020-08-28 LAB — CBC
HCT: 37.5 % — ABNORMAL LOW (ref 39.0–52.0)
Hemoglobin: 12.2 g/dL — ABNORMAL LOW (ref 13.0–17.0)
MCH: 29.3 pg (ref 26.0–34.0)
MCHC: 32.5 g/dL (ref 30.0–36.0)
MCV: 90.1 fL (ref 80.0–100.0)
Platelets: 258 10*3/uL (ref 150–400)
RBC: 4.16 MIL/uL — ABNORMAL LOW (ref 4.22–5.81)
RDW: 14.5 % (ref 11.5–15.5)
WBC: 5.1 10*3/uL (ref 4.0–10.5)
nRBC: 0 % (ref 0.0–0.2)

## 2020-08-28 LAB — MAGNESIUM: Magnesium: 1.7 mg/dL (ref 1.7–2.4)

## 2020-08-28 LAB — ETHANOL: Alcohol, Ethyl (B): 94 mg/dL — ABNORMAL HIGH (ref <10)

## 2020-08-28 MED ORDER — ALBUTEROL SULFATE HFA 108 (90 BASE) MCG/ACT IN AERS
2.0000 | INHALATION_SPRAY | Freq: Four times a day (QID) | RESPIRATORY_TRACT | Status: DC | PRN
  Filled 2020-08-28: qty 6.7

## 2020-08-28 MED ORDER — THIAMINE HCL 100 MG PO TABS
100.0000 mg | ORAL_TABLET | Freq: Every day | ORAL | Status: DC
  Administered 2020-08-28: 100 mg via ORAL
  Filled 2020-08-28: qty 1

## 2020-08-28 MED ORDER — LORAZEPAM 2 MG PO TABS
0.0000 mg | ORAL_TABLET | Freq: Four times a day (QID) | ORAL | Status: DC
  Administered 2020-08-28: 2 mg via ORAL
  Filled 2020-08-28: qty 1

## 2020-08-28 MED ORDER — LACTATED RINGERS IV BOLUS
1000.0000 mL | Freq: Once | INTRAVENOUS | Status: AC
  Administered 2020-08-28: 1000 mL via INTRAVENOUS

## 2020-08-28 MED ORDER — THIAMINE HCL 100 MG PO TABS
100.0000 mg | ORAL_TABLET | Freq: Every day | ORAL | Status: DC
  Administered 2020-08-28: 100 mg via ORAL

## 2020-08-28 MED ORDER — LORAZEPAM 2 MG/ML IJ SOLN: 0.0000 mg | Freq: Four times a day (QID) | INTRAMUSCULAR | Status: DC

## 2020-08-28 MED ORDER — ASCORBIC ACID 500 MG PO TABS
500.0000 mg | ORAL_TABLET | Freq: Every day | ORAL | Status: DC
  Administered 2020-08-28: 500 mg via ORAL
  Filled 2020-08-28: qty 1

## 2020-08-28 MED ORDER — LORAZEPAM 2 MG/ML IJ SOLN: 0.0000 mg | Freq: Two times a day (BID) | INTRAMUSCULAR | Status: DC

## 2020-08-28 MED ORDER — LISINOPRIL 10 MG PO TABS
40.0000 mg | ORAL_TABLET | Freq: Every day | ORAL | Status: DC
  Administered 2020-08-28: 40 mg via ORAL
  Filled 2020-08-28: qty 4

## 2020-08-28 MED ORDER — THIAMINE HCL 100 MG/ML IJ SOLN: 100.0000 mg | Freq: Every day | INTRAMUSCULAR | Status: DC

## 2020-08-28 MED ORDER — ONDANSETRON 4 MG PO TBDP
4.0000 mg | ORAL_TABLET | Freq: Once | ORAL | Status: AC | PRN
  Administered 2020-08-28: 4 mg via ORAL
  Filled 2020-08-28: qty 1

## 2020-08-28 MED ORDER — LORAZEPAM 2 MG PO TABS: 0.0000 mg | ORAL_TABLET | Freq: Two times a day (BID) | ORAL | Status: DC

## 2020-08-28 MED ORDER — CHLORDIAZEPOXIDE HCL 25 MG PO CAPS
ORAL_CAPSULE | ORAL | 0 refills | Status: AC
Start: 2020-08-28 — End: 2020-09-01

## 2020-08-28 NOTE — BH Assessment (Addendum)
Assessment Note  Miguel [name] is an 59 y.o. male who presents to the ER seeking assistance for his alcohol use. Patient states he has drank an unknown amount, on a daily basis. Symptoms of withdrawal includes nausea, some dizziness and shakiness. Patient states he does not want to go to a detox facility but wishes to obtain medications and discharge back home. He states he is the primary care giver for his wife, who's diagnosed with "MS."  Patient currently receives outpatient treatment with [facility] to address his substance use disorder. He initially started with them several years ago but didn't like the group setting. Since the start of COVID, he's been in their virtual meetings and he's enjoyed it. However, [facility] is in the process of starting the in-person groups. He spoke with them about individual, and they said that wasn't an option. He is currently working with the owner of the company, who provides the in-home care for his wife, with connecting with an agency who provide individual therapy. They are in the same building with the care home agency.  During the interview, the patient was calm, cooperative, and pleasant. He was able to provide appropriate answers to the questions. Throughout the interview he denied SI/HI and AV/H and no involvement with the legal system.  Diagnosis: Alcohol Use Disorder  Past Medical History:  Past Medical History:  Diagnosis Date  . Alcohol abuse   . Anxiety   . Asthma   . Family history of breast cancer   . GERD (gastroesophageal reflux disease)   . Gout   . Hx of colonic polyps   . Hypertension   . Kidney stone   . 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 OCD (obsessive compulsive disorder)   . Renal colic     Past Surgical History:  Procedure Laterality Date  . CHOLECYSTECTOMY  2012  . COLONOSCOPY    . COLONOSCOPY WITH PROPOFOL N/A 05/28/2020   Procedure: COLONOSCOPY WITH PROPOFOL;  Surgeon: [name], MD;  Location: [facility] ENDOSCOPY;   Service: Gastroenterology;  Laterality: N/A;  . EXTRACORPOREAL SHOCK WAVE LITHOTRIPSY Left 01/12/2019   Procedure: EXTRACORPOREAL SHOCK WAVE LITHOTRIPSY (ESWL);  Surgeon: Billey Co, MD;  Location: [facility] ORS;  Service: Urology;  Laterality: Left;  . UPPER GI ENDOSCOPY      Family History:  Family History  Problem Relation Age of Onset  . Alcohol abuse Father   . Breast cancer Mother 48    Social History:  reports that he has quit smoking. His smoking use included cigarettes. He has never used smokeless tobacco. He reports current alcohol use of about 12.0 standard drinks of alcohol per week. He reports that he does not use drugs.  Additional Social History:  Alcohol / Drug Use Pain Medications: See PTA Prescriptions: See PTA Over the Counter: See PTA History of alcohol / drug use?: Yes Longest period of sobriety (when/how long): Unable to quantify Substance #1 Name of Substance 1: Alcohol 1 - Amount (size/oz): Unable to quantify 1 - Frequency: Daily 1 - Duration: Approximately a year 1 - Last Use / Amount: 08/28/2020  CIWA: CIWA-Ar BP: (!) 184/87 Pulse Rate: 77 Nausea and Vomiting: no nausea and no vomiting Tactile Disturbances: none Tremor: moderate, with patient's arms extended Auditory Disturbances: very mild harshness or ability to frighten Paroxysmal Sweats: two Visual Disturbances: not present Anxiety: three Headache, Fullness in Head: none present Agitation: normal activity Orientation and Clouding of Sensorium: oriented and can do serial additions CIWA-Ar Total:  10 COWS:    Allergies:  Allergies  Allergen Reactions  . Percocet [Oxycodone-Acetaminophen] Nausea And Vomiting    Home Medications: (Not in a hospital admission)   OB/GYN Status:  No LMP for male patient.  General Assessment Data Location of Assessment: [facility] ED TTS Assessment: In system Is this a Tele or Face-to-Face Assessment?: Face-to-Face Is this an Initial Assessment or a  Re-assessment for this encounter?: Initial Assessment Patient Accompanied by:: N/A Language Other than English: No Living Arrangements: Other (Comment) (Private Home) What gender do you identify as?: Male Date Telepsych consult ordered in [facility]: 08/28/20 Time Telepsych consult ordered in [facility]: 1001 Marital status: Married Pregnancy Status: No Living Arrangements: Spouse/significant other Can pt return to current living arrangement?: Yes Admission Status: Voluntary Is patient capable of signing voluntary admission?: Yes Referral Source: Self/Family/Friend Insurance type: None  Medical Screening Exam ([place]) Medical Exam completed: Yes  Crisis Care Plan Living Arrangements: Spouse/significant other Legal Guardian: Other: (Self) Name of Psychiatrist: n/a Name of Therapist: [facility]  Education Status Is patient currently in school?: No Is the patient employed, unemployed or receiving disability?: Unemployed  Risk to self with the past 6 months Suicidal Ideation: No Has patient been a risk to self within the past 6 months prior to admission? : No Suicidal Intent: No Has patient had any suicidal intent within the past 6 months prior to admission? : No Is patient at risk for suicide?: No Suicidal Plan?: No Has patient had any suicidal plan within the past 6 months prior to admission? : No Access to Means: No What has been your use of drugs/alcohol within the last 12 months?: Alcohol Previous Attempts/Gestures: No How many times?: 0 Other Self Harm Risks: Alcohol Abuse Triggers for Past Attempts: None known Intentional Self Injurious Behavior: None Family Suicide History: Unknown Recent stressful life event(s): Other (Comment), Loss (Comment) Persecutory voices/beliefs?: No Depression: Yes Depression Symptoms: Insomnia, Tearfulness, Feeling worthless/self pity, Guilt Substance abuse history and/or treatment for substance abuse?: Yes Suicide prevention information given to  non-admitted patients: Not applicable  Risk to Others within the past 6 months Homicidal Ideation: No Does patient have any lifetime risk of violence toward others beyond the six months prior to admission? : No Thoughts of Harm to Others: No Current Homicidal Intent: No Current Homicidal Plan: No Access to Homicidal Means: No Identified Victim: Reports of none History of harm to others?: No Assessment of Violence: None Noted Violent Behavior Description: Reports of none Does patient have access to weapons?: No Criminal Charges Pending?: No Does patient have a court date: No Is patient on probation?: No  Psychosis Hallucinations: None noted Delusions: None noted  Mental Status Report Appearance/Hygiene: Unremarkable, In scrubs Eye Contact: Good Motor Activity: Unremarkable, Freedom of movement Speech: Logical/coherent, Unremarkable Level of Consciousness: Alert Mood: Anxious, Sad, Pleasant Affect: Appropriate to circumstance, Sad Anxiety Level: Minimal Thought Processes: Coherent, Relevant Judgement: Unimpaired Orientation: Person, Place, Time, Situation, Appropriate for developmental age Obsessive Compulsive Thoughts/Behaviors: Minimal  Cognitive Functioning Concentration: Normal Memory: Recent Intact, Remote Intact Is patient IDD: No Insight: Fair Impulse Control: Fair Appetite: Good Have you had any weight changes? : No Change Sleep: No Change Total Hours of Sleep: 6 Vegetative Symptoms: None  ADLScreening [facility] Assessment Services) Patient's cognitive ability adequate to safely complete daily activities?: Yes Patient able to express need for assistance with ADLs?: Yes Independently performs ADLs?: Yes (appropriate for developmental age)  Prior Inpatient Therapy Prior Inpatient Therapy: Yes Prior Therapy Dates: 07/2019, 03/2020 Prior Therapy Facilty/Provider(s):  [facility] Reason for Treatment: Depression  Prior Outpatient Therapy Prior Outpatient Therapy:  Yes Prior Therapy Dates: Current Prior Therapy Facilty/Provider(s): [facility] Reason for Treatment: Alcohol abuse Does patient have an ACCT team?: No Does patient have Intensive In-House Services?  : No Does patient have [facility] services? : No Does patient have P4CC services?: No  ADL Screening (condition at time of admission) Patient's cognitive ability adequate to safely complete daily activities?: Yes Is the patient deaf or have difficulty hearing?: No Does the patient have difficulty seeing, even when wearing glasses/contacts?: No Does the patient have difficulty concentrating, remembering, or making decisions?: No Patient able to express need for assistance with ADLs?: Yes Does the patient have difficulty dressing or bathing?: No Independently performs ADLs?: Yes (appropriate for developmental age) Does the patient have difficulty walking or climbing stairs?: No Weakness of Legs: None Weakness of Arms/Hands: None  Home Assistive Devices/Equipment Home Assistive Devices/Equipment: None  Therapy Consults (therapy consults require a physician order) PT Evaluation Needed: No OT Evalulation Needed: No SLP Evaluation Needed: No Abuse/Neglect Assessment (Assessment to be complete while patient is alone) Abuse/Neglect Assessment Can Be Completed: Yes Physical Abuse: Denies Verbal Abuse: Denies Sexual Abuse: Denies Exploitation of patient/patient's resources: Denies Self-Neglect: Denies Values / Beliefs Cultural Requests During Hospitalization: None Spiritual Requests During Hospitalization: None Consults Spiritual Care Consult Needed: No Transition of Care Team Consult Needed: No Advance Directives (For Healthcare) Does Patient Have a Medical Advance Directive?: No Would patient like information on creating a medical advance directive?: No - Patient declined  Disposition:  Disposition Initial Assessment Completed for this Encounter: Yes  On Site Evaluation by:   Reviewed with  Physician:    Gunnar Fusi MS, LCAS, [facility], [place] Therapeutic Triage Specialist 08/28/2020 12:50 PM

## 2020-08-28 NOTE — ED Triage Notes (Signed)
Patient c/o shaking and nausea. Last alcoholic drink around 2pm yesterday 08/27/2020. Oriented at this time. Reports recent fracture in right foot X2 months ago that causes some pain.

## 2020-08-28 NOTE — ED Provider Notes (Signed)
[facility] Emergency Department Provider Note  ____________________________________________   First MD Initiated Contact with Patient 08/28/20 [phone]     (approximate)  I have reviewed the triage vital signs and the nursing notes.   HISTORY  Chief Complaint Alcohol Problem   HPI Miguel [name] is a 59 y.o. male with a past medical history of HTN, nephrolithiasis, OCD, asthma, anxiety, alcohol abuse, and recent right ankle fracture 2 months ago who presents for assessment of shakiness and nausea after he decided to stop drinking and is requesting help with his alcohol abuse.  Patient states he typically drinks approximate 1 pint of vodka per day.  He states he last had a drink at 2 PM yesterday.  He states he has been through withdrawal in the past but has never had a seizure.  He states he did vomit once today that was nonbloody nonbilious.  He currently has some epigastric pain but denies any headache, earache, sore throat, chest pain, cough, shortness of breath, diarrhea, dysuria, back pain, rash, extremity pain, recent traumatic injuries.  Denies illicit drug use.         Past Medical History:  Diagnosis Date  . Alcohol abuse   . Anxiety   . Asthma   . Family history of breast cancer   . GERD (gastroesophageal reflux disease)   . Gout   . Hx of colonic polyps   . Hypertension   . Kidney stone   . 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 OCD (obsessive compulsive disorder)   . Renal colic     Patient Active Problem List   Diagnosis Date Noted  . Genetic testing 07/15/2020  . 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 Right ankle pain 06/26/2020  . Family history of breast cancer   . Hx of colonic polyps   . Hospital discharge follow-up 06/05/2020  . Kidney function test abnormal 06/05/2020  . Abdominal pain 04/12/2020  . Hypertensive urgency 04/12/2020  . Alcohol withdrawal ([place]) 03/04/2020  . Right-sided chest wall  pain 02/28/2020  . Alcohol abuse with alcohol-induced mood disorder ([place]) 12/02/2019  . Moderate episode of recurrent major depressive disorder ([place])   . Health care maintenance 10/26/2019  . Right knee pain 10/26/2019  . Generalized anxiety disorder 10/14/2019  . MDD (major depressive disorder), recurrent severe, without psychosis ([place]) 07/27/2019  . Social anxiety disorder 07/27/2019  . Left-sided chest wall pain 06/14/2019  . Alcoholic cirrhosis of liver without ascites ([place]) 06/14/2019  . Alcohol abuse 06/14/2019  . AKI (acute kidney injury) ([place]) 05/27/2019  . Alcohol withdrawal syndrome without complication ([place]) 61/60/7371  . Alcoholic intoxication without complication ([place])   . Sepsis ([place]) 03/08/2019  . Breast lump or mass 10/04/2018  . Sepsis secondary to UTI ([place]) 09/14/2018  . Asthma 07/07/2018  . GERD (gastroesophageal reflux disease) 07/07/2018  . OCD (obsessive compulsive disorder) 07/07/2018  . Self-inflicted laceration of left wrist ([place]) 03/10/2018  . Leg hematoma 12/25/2017  . Suicide and self-inflicted injury by cutting and piercing instrument ([place]) 03/10/2017  . Severe recurrent major 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 ([place]) 03/09/2017  . Substance induced mood disorder ([place]) 08/15/2016  . Involuntary commitment 08/15/2016  . Alcohol use disorder, severe, dependence ([place]) 02/05/2016  . Hypertension 12/05/2015  . Tachycardia 12/05/2015  . Gout 11/13/2015  . Chronic back pain 05/02/2015    Past Surgical History:  Procedure Laterality Date  . CHOLECYSTECTOMY  2012  . COLONOSCOPY    .  COLONOSCOPY WITH PROPOFOL N/A 05/28/2020   Procedure: COLONOSCOPY WITH PROPOFOL;  Surgeon: [name], MD;  Location: [facility] ENDOSCOPY;  Service: Gastroenterology;  Laterality: N/A;  . EXTRACORPOREAL SHOCK WAVE LITHOTRIPSY Left 01/12/2019   Procedure: EXTRACORPOREAL SHOCK WAVE LITHOTRIPSY (ESWL);  Surgeon: Billey Co, MD;  Location: [facility] ORS;  Service: Urology;  Laterality:  Left;  . UPPER GI ENDOSCOPY      Prior to Admission medications   Medication Sig Start Date End Date Taking? Authorizing Provider  albuterol (VENTOLIN HFA) 108 (90 Base) MCG/ACT inhaler Inhale 2 puffs into the lungs every 6 (six) hours as needed. 03/21/20   Clapacs, Madie Reno, MD  allopurinol (ZYLOPRIM) 300 MG tablet Take 1 tablet (300 mg total) by mouth daily. 04/23/20   Iloabachie, Chioma E, NP  ascorbic acid (VITAMIN C) 500 MG tablet Take by mouth. 08/01/19   [provider]  chlordiazePOXIDE (LIBRIUM) 25 MG capsule Take 2 capsules (50 mg total) by mouth every 6 (six) hours for 1 day, THEN 1 capsule (25 mg total) every 6 (six) hours for 1 day, THEN 1 capsule (25 mg total) every 12 (twelve) hours for 1 day, THEN 1 capsule (25 mg total) at bedtime for 1 day. 08/28/20 09/01/20  [name], MD  citalopram (CELEXA) 20 MG tablet Take 1 tablet (20 mg total) by mouth daily. 03/21/20 03/21/21  Clapacs, Madie Reno, MD  colchicine 0.6 MG tablet Take by mouth. 08/01/19   [provider]  Fluticasone-Salmeterol (ADVAIR DISKUS) 100-50 MCG/DOSE AEPB Inhale 1 puff into the lungs 2 (two) times daily. 03/21/20   Clapacs, Madie Reno, MD  folic acid (FOLVITE) 1 MG tablet Take 1 tablet (1 mg total) by mouth daily. 07/16/20   Iloabachie, Chioma E, NP  lisinopril (ZESTRIL) 40 MG tablet Take 1 tablet (40 mg total) by mouth daily. 04/23/20   Iloabachie, Chioma E, NP  omeprazole (PRILOSEC) 10 MG capsule Take 1 capsule (10 mg total) by mouth daily. 04/21/20   Cuthriell, Charline Bills, PA-C  ondansetron (ZOFRAN ODT) 4 MG disintegrating tablet Allow 1-2 tablets to dissolve in your mouth every 8 hours as needed for nausea/vomiting 08/19/20   Hinda Kehr, MD  thiamine 100 MG tablet Take 1 tablet (100 mg total) by mouth daily. 03/22/20   Clapacs, Madie Reno, MD  traMADol (ULTRAM) 50 MG tablet Take 50 mg by mouth every 4 (four) hours. 08/07/20   [provider]    Allergies Percocet [oxycodone-acetaminophen]  Family History    Problem Relation Age of Onset  . Alcohol abuse Father   . Breast cancer Mother 36    Social History Social History   Tobacco Use  . Smoking status: Former Smoker    Types: Cigarettes  . Smokeless tobacco: Never Used  . Tobacco comment: quit 30 years ago  Vaping Use  . Vaping Use: Never used  Substance Use Topics  . Alcohol use: Yes    Alcohol/week: 12.0 standard drinks    Types: 12 Shots of liquor per week  . Drug use: No    Review of Systems  Review of Systems  Constitutional: Negative for chills and fever.  HENT: Negative for sore throat.   Eyes: Negative for pain.  Respiratory: Negative for cough and stridor.   Cardiovascular: Negative for chest pain.  Gastrointestinal: Positive for nausea and vomiting.  Genitourinary: Negative for dysuria.  Musculoskeletal: Negative for myalgias.  Skin: Negative for rash.  Neurological: Negative for seizures, loss of consciousness and headaches.  Psychiatric/Behavioral: Negative for suicidal ideas.  All other systems reviewed and are negative.     ____________________________________________   PHYSICAL EXAM:  VITAL SIGNS: ED Triage Vitals [08/28/20 0722]  Enc Vitals Group     BP (!) 182/103     Pulse Rate 90     Resp 16     Temp 98.1 F (36.7 C)     Temp Source Oral     SpO2 99 %     Weight 195 lb (88.5 kg)     Height 6' (1.829 m)     Head Circumference      Peak Flow      Pain Score 5     Pain Loc      Pain Edu?      Excl. in [place]?    Vitals:   08/28/20 0722 08/28/20 1113  BP: (!) 182/103 (!) 182/103  Pulse: 90 90  Resp: 16   Temp: 98.1 F (36.7 C)   SpO2: 99%    Physical Exam Vitals and nursing note reviewed.  Constitutional:      Appearance: He is well-developed.  HENT:     Head: Normocephalic and atraumatic.     Right Ear: External ear normal.     Left Ear: External ear normal.     Nose: Nose normal.     Mouth/Throat:     Mouth: Mucous membranes are dry.  Eyes:     Conjunctiva/sclera:  Conjunctivae normal.  Cardiovascular:     Rate and Rhythm: Normal rate and regular rhythm.     Heart sounds: No murmur heard.   Pulmonary:     Effort: Pulmonary effort is normal. No respiratory distress.     Breath sounds: Normal breath sounds.  Abdominal:     Palpations: Abdomen is soft.     Tenderness: There is no abdominal tenderness.  Musculoskeletal:     Cervical back: Neck supple.  Skin:    General: Skin is warm and dry.  Neurological:     Mental Status: He is alert and oriented to person, place, and time.     Motor: Tremor present.  Psychiatric:        Mood and Affect: Mood is anxious.        Thought Content: Thought content does not include homicidal or suicidal ideation.     Patient has some mild tenderness over the right ankle where he states he has been hurting for 2 weeks but no effusion or deformity.  He has a 2+ DP pulse and sensation intact throughout the foot.  There is no overlying skin changes including erythema, edema, streaking, fluctuance, or warmth. ____________________________________________   LABS (all labs ordered are listed, but only abnormal results are displayed)  Labs Reviewed  COMPREHENSIVE METABOLIC PANEL - Abnormal; Notable for the following components:      Result Value   Glucose, Bld 120 (*)    AST 12 (*)    All other components within normal limits  CBC - Abnormal; Notable for the following components:   RBC 4.16 (*)    Hemoglobin 12.2 (*)    HCT 37.5 (*)    All other components within normal limits  URINALYSIS, COMPLETE (UACMP) WITH MICROSCOPIC - Abnormal; Notable for the following components:   Color, Urine YELLOW (*)    APPearance CLEAR (*)    Bilirubin Urine MODERATE (*)    Leukocytes,Ua SMALL (*)    All other components within normal limits  ETHANOL - Abnormal; Notable for the following components:   Alcohol, Ethyl (B) 94 (*)  All other components within normal limits  RESPIRATORY PANEL BY RT PCR (FLU A&B, COVID)  LIPASE,  BLOOD  MAGNESIUM  URINE DRUG SCREEN, QUALITATIVE ([facility] ONLY)   ____________________________________________  EKG  Sinus rhythm with a ventricular rate of 75, artifact in V1 and some nonspecific changes in lead III and V1 with no other evidence of acute ischemia or significant underlying arrhythmia.   ____________________________________________  __________________________   PROCEDURES  Procedure(s) performed (including Critical Care):  Procedures   ____________________________________________   INITIAL IMPRESSION / ASSESSMENT AND PLAN / ED COURSE        Patient presents with above-stated history exam requesting assistance with alcohol abuse as well as some tremors and an episode of vomiting that occurred after his last drink yesterday.  Patient is hypertensive with a BP of 182/103 with otherwise stable vital signs on room air.  Exam as above remarkable for mild tenderness of the right ankle is otherwise neurovascular intact without evidence of acute infectious process dislocation or effusion.  Patient does report some nausea and vomiting last night his abdomen is soft nontender throughout and I suspect this is related to some gastritis versus EtOH withdrawal.  His blood alcohol level is slightly elevated at 94 given patient's reported history I suspect he has been chronically at a much higher level for several weeks.  Given he does have some tremors and is hypertensive he was placed on [place] protocol and given some IV fluids for mild dehydration.  No evidence on above labs of acute pancreatitis or significant electrolyte or metabolic derangements.  CBC is unremarkable.  Magnesium is WNL.  Urine does show moderate bilirubin and small LES but overall does not appear infected.  Patient was given IV fluids, Ativan, and a dose of his home blood pressure medicine as noted below.  On my reassessment patient stated he felt much better.  He did speak with ED social worker and patient wishes  to pursue outpatient alcohol abuse counseling and is amenable to Librium therapy.  Rx written for Librium taper.  Given patient appears clinically sober with improvement in his blood pressure and tremors and is not psychotic suicidal or homicidal I feel safe for discharge with plan for close outpatient PCP and outpatient substance abuse counseling services.  Discharged stable condition.  ____________________________________________   FINAL CLINICAL IMPRESSION(S) / ED DIAGNOSES  Final diagnoses:  Alcohol abuse  Hypertension, unspecified type    Medications  LORazepam (ATIVAN) injection 0-4 mg ( Intravenous See Alternative 08/28/20 1033)    Or  LORazepam (ATIVAN) tablet 0-4 mg (2 mg Oral Given 08/28/20 1033)  LORazepam (ATIVAN) injection 0-4 mg (has no administration in time range)    Or  LORazepam (ATIVAN) tablet 0-4 mg (has no administration in time range)  thiamine tablet 100 mg (100 mg Oral Given 08/28/20 1109)    Or  thiamine (B-1) injection 100 mg ( Intravenous See Alternative 08/28/20 1109)  lisinopril (ZESTRIL) tablet 40 mg (40 mg Oral Given 08/28/20 1033)  thiamine tablet 100 mg (100 mg Oral Given 08/28/20 1110)  ascorbic acid (VITAMIN C) tablet 500 mg (500 mg Oral Given 08/28/20 1033)  albuterol (VENTOLIN HFA) 108 (90 Base) MCG/ACT inhaler 2 puff (has no administration in time range)  ondansetron (ZOFRAN-ODT) disintegrating tablet 4 mg (4 mg Oral Given 08/28/20 0734)  lactated ringers bolus 1,000 mL (1,000 mLs Intravenous New Bag/Given 08/28/20 1108)     ED Discharge Orders         Ordered    chlordiazePOXIDE (LIBRIUM) 25  MG capsule        08/28/20 1115           Note:  This document was prepared using Dragon voice recognition software and may include unintentional dictation errors.   [name], MD 08/28/20 [phone]

## 2020-09-03 ENCOUNTER — Telehealth: Payer: Self-pay | Admitting: Gerontology

## 2020-09-13 ENCOUNTER — Emergency Department: Payer: Self-pay

## 2020-09-13 ENCOUNTER — Other Ambulatory Visit: Payer: Self-pay

## 2020-09-13 DIAGNOSIS — J45909 Unspecified asthma, uncomplicated: Secondary | ICD-10-CM | POA: Insufficient documentation

## 2020-09-13 DIAGNOSIS — I10 Essential (primary) hypertension: Secondary | ICD-10-CM | POA: Insufficient documentation

## 2020-09-13 DIAGNOSIS — S0990XA Unspecified injury of head, initial encounter: Secondary | ICD-10-CM | POA: Insufficient documentation

## 2020-09-13 DIAGNOSIS — M542 Cervicalgia: Secondary | ICD-10-CM | POA: Insufficient documentation

## 2020-09-13 DIAGNOSIS — Z7951 Long term (current) use of inhaled steroids: Secondary | ICD-10-CM | POA: Insufficient documentation

## 2020-09-13 DIAGNOSIS — W010XXA Fall on same level from slipping, tripping and stumbling without subsequent striking against object, initial encounter: Secondary | ICD-10-CM | POA: Insufficient documentation

## 2020-09-13 DIAGNOSIS — Z87891 Personal history of nicotine dependence: Secondary | ICD-10-CM | POA: Insufficient documentation

## 2020-09-13 DIAGNOSIS — Z79899 Other long term (current) drug therapy: Secondary | ICD-10-CM | POA: Insufficient documentation

## 2020-09-13 LAB — BASIC METABOLIC PANEL
Anion gap: 12 (ref 5–15)
BUN: 18 mg/dL (ref 6–20)
CO2: 22 mmol/L (ref 22–32)
Calcium: 8.7 mg/dL — ABNORMAL LOW (ref 8.9–10.3)
Chloride: 108 mmol/L (ref 98–111)
Creatinine, Ser: 1.49 mg/dL — ABNORMAL HIGH (ref 0.61–1.24)
GFR, Estimated: 53 mL/min — ABNORMAL LOW (ref >60)
Glucose, Bld: 112 mg/dL — ABNORMAL HIGH (ref 70–99)
Potassium: 3.6 mmol/L (ref 3.5–5.1)
Sodium: 142 mmol/L (ref 135–145)

## 2020-09-13 LAB — CBC WITH DIFFERENTIAL/PLATELET
Abs Immature Granulocytes: 0.02 10*3/uL (ref 0.00–0.07)
Basophils Absolute: 0.1 10*3/uL (ref 0.0–0.1)
Basophils Relative: 1 %
Eosinophils Absolute: 0.1 10*3/uL (ref 0.0–0.5)
Eosinophils Relative: 2 %
HCT: 40.7 % (ref 39.0–52.0)
Hemoglobin: 13 g/dL (ref 13.0–17.0)
Immature Granulocytes: 0 %
Lymphocytes Relative: 37 %
Lymphs Abs: 2.5 10*3/uL (ref 0.7–4.0)
MCH: 28.7 pg (ref 26.0–34.0)
MCHC: 31.9 g/dL (ref 30.0–36.0)
MCV: 89.8 fL (ref 80.0–100.0)
Monocytes Absolute: 0.6 10*3/uL (ref 0.1–1.0)
Monocytes Relative: 10 %
Neutro Abs: 3.4 10*3/uL (ref 1.7–7.7)
Neutrophils Relative %: 50 %
Platelets: 229 10*3/uL (ref 150–400)
RBC: 4.53 MIL/uL (ref 4.22–5.81)
RDW: 15.9 % — ABNORMAL HIGH (ref 11.5–15.5)
WBC: 6.7 10*3/uL (ref 4.0–10.5)
nRBC: 0 % (ref 0.0–0.2)

## 2020-09-13 MED ORDER — SODIUM CHLORIDE 0.9 % IV BOLUS
1000.0000 mL | Freq: Once | INTRAVENOUS | Status: AC
  Administered 2020-09-13: 1000 mL via INTRAVENOUS

## 2020-09-13 NOTE — ED Triage Notes (Signed)
Pt comes into the ED via [facility] from home c/o left side back pain.  Pt states he had a fall 3 hrs ago.  Pt in NAD at this time.  Pt had BP of 92/58.  Pt has ETOH on board.

## 2020-09-13 NOTE — ED Triage Notes (Signed)
Pt to ED via EMS for fall, c/o dizziness. States hit right side of head when fell, denies LOC or blood thinner use.  C/o right shoulder pain and neck pain.   Pt has been drinking "about 6 beer", states drinks every day.   Discussed pt with Dr Charlsie Quest

## 2020-09-14 ENCOUNTER — Emergency Department
Admission: EM | Admit: 2020-09-14 | Discharge: 2020-09-14 | Disposition: A | Discharge status: Home / Self Care | Payer: Self-pay | Attending: Emergency Medicine | Admitting: Emergency Medicine

## 2020-09-14 DIAGNOSIS — M542 Cervicalgia: Secondary | ICD-10-CM

## 2020-09-14 DIAGNOSIS — W19XXXA Unspecified fall, initial encounter: Secondary | ICD-10-CM

## 2020-09-14 DIAGNOSIS — S0990XA Unspecified injury of head, initial encounter: Secondary | ICD-10-CM

## 2020-09-14 MED ORDER — ACETAMINOPHEN 500 MG PO TABS
ORAL_TABLET | ORAL | Status: AC
  Administered 2020-09-14: 1000 mg via ORAL
  Filled 2020-09-14: qty 2

## 2020-09-14 MED ORDER — ACETAMINOPHEN 500 MG PO TABS: 1000.0000 mg | ORAL_TABLET | Freq: Once | ORAL | Status: AC
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
## 2020-09-14 NOTE — Discharge Instructions (Signed)
Please use ibuprofen or Tylenol for any continued pain.

## 2020-09-14 NOTE — ED Notes (Signed)
Reviewed discharge instructions, follow-up care, cryotherapy, and OTC pain relievers with patient. Patient verbalized understanding of all information reviewed. Patient stable, with no distress noted at this time.

## 2020-09-14 NOTE — ED Notes (Signed)
Patient reports mechanical fall on wet leaves. Patient c/o right neck pain extending towards shoulder, and RLQ abdominal pain. Patient is tender to palpation of RLQ.

## 2020-09-14 NOTE — ED Notes (Signed)
ED Provider at bedside. 

## 2020-09-18 ENCOUNTER — Other Ambulatory Visit: Payer: Self-pay | Admitting: Gerontology

## 2020-09-18 ENCOUNTER — Other Ambulatory Visit: Payer: Self-pay

## 2020-09-18 ENCOUNTER — Ambulatory Visit: Payer: Medicaid Other | Admitting: Gerontology

## 2020-09-18 ENCOUNTER — Encounter: Payer: Self-pay | Admitting: Gerontology

## 2020-09-18 VITALS — BP 129/78 | HR 113 | Temp 97.9°F | Resp 14 | Ht 72.0 in | Wt 189.0 lb

## 2020-09-18 DIAGNOSIS — R944 Abnormal results of kidney function studies: Secondary | ICD-10-CM

## 2020-09-18 DIAGNOSIS — Z0000 Encounter for general adult medical examination without abnormal findings: Secondary | ICD-10-CM

## 2020-09-18 DIAGNOSIS — I10 Essential (primary) hypertension: Secondary | ICD-10-CM

## 2020-09-18 DIAGNOSIS — F1010 Alcohol abuse, uncomplicated: Secondary | ICD-10-CM

## 2020-09-18 DIAGNOSIS — M109 Gout, unspecified: Secondary | ICD-10-CM

## 2020-09-18 NOTE — Progress Notes (Signed)
Pt is here for a follow up. Pt states  Fracture around last visit in right foot but healed. Pt has no complaints of pain.

## 2020-09-18 NOTE — Progress Notes (Signed)
 Established Patient Office Visit  Subjective:  Patient ID: Miguel [name], male    DOB: 09/23/1961  Age: 58 y.o. MRN: [id number]  CC: No chief complaint on file.   HPI Miguel [name] presents for follow up. He was seen in the ED on 09/14/20 for a fall and head injury. A ilCT was performed with no evidence of acute abnormalities. He reports that his head pain has resolved. He reports compliance with his daily medications except Celexa. Since his last visit to the clinic on August, he has been seen in the ED 4 times for alcohol-related issues. Patient reports that he last drank alcohol yesterday and he drank about a pint of liquor. He states that his mood is stable, he denies suicidal or homicidal ideation. Overall, states that he is doing well today and offers no further complaint.   Past Medical History:  Diagnosis Date  . Alcohol abuse   . Anxiety   . Asthma   . Family history of breast cancer   . GERD (gastroesophageal reflux disease)   . Gout   . Hx of colonic polyps   . Hypertension   . Kidney stone   . 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 OCD (obsessive compulsive disorder)   . Renal colic     Past Surgical History:  Procedure Laterality Date  . CHOLECYSTECTOMY  2012  . COLONOSCOPY    . COLONOSCOPY WITH PROPOFOL N/A 05/28/2020   Procedure: COLONOSCOPY WITH PROPOFOL;  Surgeon: Anna, Kiran, MD;  Location: [facility] ENDOSCOPY;  Service: Gastroenterology;  Laterality: N/A;  . EXTRACORPOREAL SHOCK WAVE LITHOTRIPSY Left 01/12/2019   Procedure: EXTRACORPOREAL SHOCK WAVE LITHOTRIPSY (ESWL);  Surgeon: Sninsky, Brian C, MD;  Location: [facility] ORS;  Service: Urology;  Laterality: Left;  . UPPER GI ENDOSCOPY      Family History  Problem Relation Age of Onset  . Alcohol abuse Father   . Breast cancer Mother 70    Social History   Socioeconomic History  . Marital status: Married    Spouse name: Not on file  . Number of children: Not on file  . Years of education: Not on  file  . Highest education level: Not on file  Occupational History  . Not on file  Tobacco Use  . Smoking status: Former Smoker    Types: Cigarettes  . Smokeless tobacco: Never Used  . Tobacco comment: quit 30 years ago  Vaping Use  . Vaping Use: Never used  Substance and Sexual Activity  . Alcohol use: Yes    Alcohol/week: 12.0 standard drinks    Types: 12 Shots of liquor per week  . Drug use: No  . Sexual activity: Not on file  Other Topics Concern  . Not on file  Social History Narrative   Lives at home with his wife and takes care of his wife. Independent at baseline      - Biggest strain is financial but doesn't think they could got get more help.      Patient expressed interest in possible financial assistance. Informed written consent obtained   Social Determinants of Health   Financial Resource Strain: High Risk  . Difficulty of Paying Living Expenses: Very hard  Food Insecurity: No Food Insecurity  . Worried About Running Out of Food in the Last Year: Never true  . Ran Out of Food in the Last Year: Never true  Transportation Needs: No Transportation Needs  . Lack of Transportation (Medical): No  .   Lack of Transportation (Non-Medical): No  Physical Activity: Sufficiently Active  . Days of Exercise per Week: 7 days  . Minutes of Exercise per Session: 60 min  Stress: Stress Concern Present  . Feeling of Stress : Very much  Social Connections:   . Frequency of Communication with Friends and Family: Not on file  . Frequency of Social Gatherings with Friends and Family: Not on file  . Attends Religious Services: Not on file  . Active Member of Clubs or Organizations: Not on file  . Attends Club or Organization Meetings: Not on file  . Marital Status: Not on file  Intimate Partner Violence: Not At Risk  . Fear of Current or Ex-Partner: No  . Emotionally Abused: No  . Physically Abused: No  . Sexually Abused: No    Outpatient Medications Prior to Visit   Medication Sig Dispense Refill  . albuterol (VENTOLIN HFA) 108 (90 Base) MCG/ACT inhaler Inhale 2 puffs into the lungs every 6 (six) hours as needed. 6.7 g 1  . allopurinol (ZYLOPRIM) 300 MG tablet Take 1 tablet (300 mg total) by mouth daily. 30 tablet 2  . ascorbic acid (VITAMIN C) 500 MG tablet Take by mouth.    . colchicine 0.6 MG tablet Take by mouth.    . Fluticasone-Salmeterol (ADVAIR DISKUS) 100-50 MCG/DOSE AEPB Inhale 1 puff into the lungs 2 (two) times daily. 180 each 0  . folic acid (FOLVITE) 1 MG tablet Take 1 tablet (1 mg total) by mouth daily. 30 tablet 3  . lisinopril (ZESTRIL) 40 MG tablet Take 1 tablet (40 mg total) by mouth daily. 30 tablet 2  . omeprazole (PRILOSEC) 10 MG capsule Take 1 capsule (10 mg total) by mouth daily. 30 capsule 4  . ondansetron (ZOFRAN ODT) 4 MG disintegrating tablet Allow 1-2 tablets to dissolve in your mouth every 8 hours as needed for nausea/vomiting 30 tablet 0  . citalopram (CELEXA) 20 MG tablet Take 1 tablet (20 mg total) by mouth daily. (Patient not taking: Reported on 09/18/2020) 30 tablet 1  . thiamine 100 MG tablet Take 1 tablet (100 mg total) by mouth daily. (Patient not taking: Reported on 09/18/2020) 30 tablet 1  . traMADol (ULTRAM) 50 MG tablet Take 50 mg by mouth every 4 (four) hours. (Patient not taking: Reported on 09/18/2020)     No facility-administered medications prior to visit.    Allergies  Allergen Reactions  . Percocet [Oxycodone-Acetaminophen] Nausea And Vomiting    ROS Review of Systems  Constitutional: Negative for appetite change.  HENT: Negative.   Respiratory: Negative.   Cardiovascular: Negative.   Gastrointestinal: Negative.   Endocrine: Negative.   Musculoskeletal: Negative.   Skin: Negative.   Neurological: Negative.   Psychiatric/Behavioral: The patient is nervous/anxious.       Objective:    Physical Exam Constitutional:      Appearance: Normal appearance.  Cardiovascular:     Rate and Rhythm:  Normal rate and regular rhythm.     Pulses: Normal pulses.     Heart sounds: Normal heart sounds.  Pulmonary:     Effort: Pulmonary effort is normal.     Breath sounds: Normal breath sounds.  Abdominal:     General: Abdomen is flat. Bowel sounds are normal.     Palpations: Abdomen is soft.  Musculoskeletal:        General: Normal range of motion.  Skin:    General: Skin is warm and dry.     Capillary Refill: Capillary refill takes less   than 2 seconds.  Neurological:     General: No focal deficit present.     Mental Status: He is alert and oriented to person, place, and time.     BP 129/78 (BP Location: Right Arm, Patient Position: Sitting)   Pulse (!) 113   Temp 97.9 F (36.6 C)   Resp 14   Ht 6' (1.829 m)   Wt 189 lb (85.7 kg)   SpO2 99%   BMI 25.63 kg/m  Wt Readings from Last 3 Encounters:  09/18/20 189 lb (85.7 kg)  09/13/20 185 lb (83.9 kg)  08/28/20 195 lb (88.5 kg)     Health Maintenance Due  Topic Date Due  . Hepatitis C Screening  Never done  . COVID-19 Vaccine (1) Never done  . INFLUENZA VACCINE  06/16/2020    There are no preventive care reminders to display for this patient.  Lab Results  Component Value Date   TSH 1.831 03/18/2020   Lab Results  Component Value Date   WBC 6.7 09/13/2020   HGB 13.0 09/13/2020   HCT 40.7 09/13/2020   MCV 89.8 09/13/2020   PLT 229 09/13/2020   Lab Results  Component Value Date   NA 142 09/13/2020   K 3.6 09/13/2020   CO2 22 09/13/2020   GLUCOSE 112 (H) 09/13/2020   BUN 18 09/13/2020   CREATININE 1.49 (H) 09/13/2020   BILITOT 0.5 08/28/2020   ALKPHOS 63 08/28/2020   AST 12 (L) 08/28/2020   ALT 12 08/28/2020   PROT 6.6 08/28/2020   ALBUMIN 4.0 08/28/2020   CALCIUM 8.7 (L) 09/13/2020   ANIONGAP 12 09/13/2020   Lab Results  Component Value Date   CHOL 155 05/22/2020   Lab Results  Component Value Date   HDL 65 05/22/2020   Lab Results  Component Value Date   LDLCALC 73 05/22/2020   Lab  Results  Component Value Date   TRIG 92 05/22/2020   Lab Results  Component Value Date   CHOLHDL 2.4 05/22/2020   Lab Results  Component Value Date   HGBA1C 5.3 03/18/2020      Assessment & Plan:   1. Alcohol abuse He was advised to abstain from alcohol.  Follow up with [facility] for his mental health care. Social worker will contact patient regarding potential virtual Alcoholics Anonymous as he expressed interest if the peers were his age.   2. Primary hypertension Blood pressure is under control. His goal is <140/90. Continue medications as prescribed.  Follow the DASH diet and increase exercise as tolerated with a goal of 150 minutes per week.   3. Kidney function test abnormal Creatinine on 09/13/20 was 1.49 mg/dL. He was advised to increase daily water intake.   4. Health care maintenance Flu vaccine administered. - Flu Vaccine QUAD 6+ mos PF IM (Fluarix Quad PF)   No orders of the defined types were placed in this encounter.   Follow-up: Return in about 5 weeks (around 10/23/2020), or if symptoms worsen or fail to improve.    Jake , Student-NP 

## 2020-09-19 ENCOUNTER — Telehealth: Payer: Self-pay

## 2020-09-19 NOTE — Telephone Encounter (Signed)
Patient called social work Theatre manager back and was provided with the Alcoholics Anonymous website for [employer], per his interest.

## 2020-09-19 NOTE — Telephone Encounter (Signed)
Social work Theatre manager called for patient but his wife answered.  Asked patient's wife if she could ask patient to call the clinic and that intern has resources to share with patient.

## 2020-09-20 IMAGING — CR DG SHOULDER 2+V*R*
1 series · 3 of 3 positions shown · non-contrast
Comparison: None.

CLINICAL DATA: Right shoulder pain.

EXAM:
RIGHT SHOULDER - 2+ VIEW

[Series 1: dg shoulder right · 0.14mm/px · 3 of 3 slices shown]
[im 1/3]
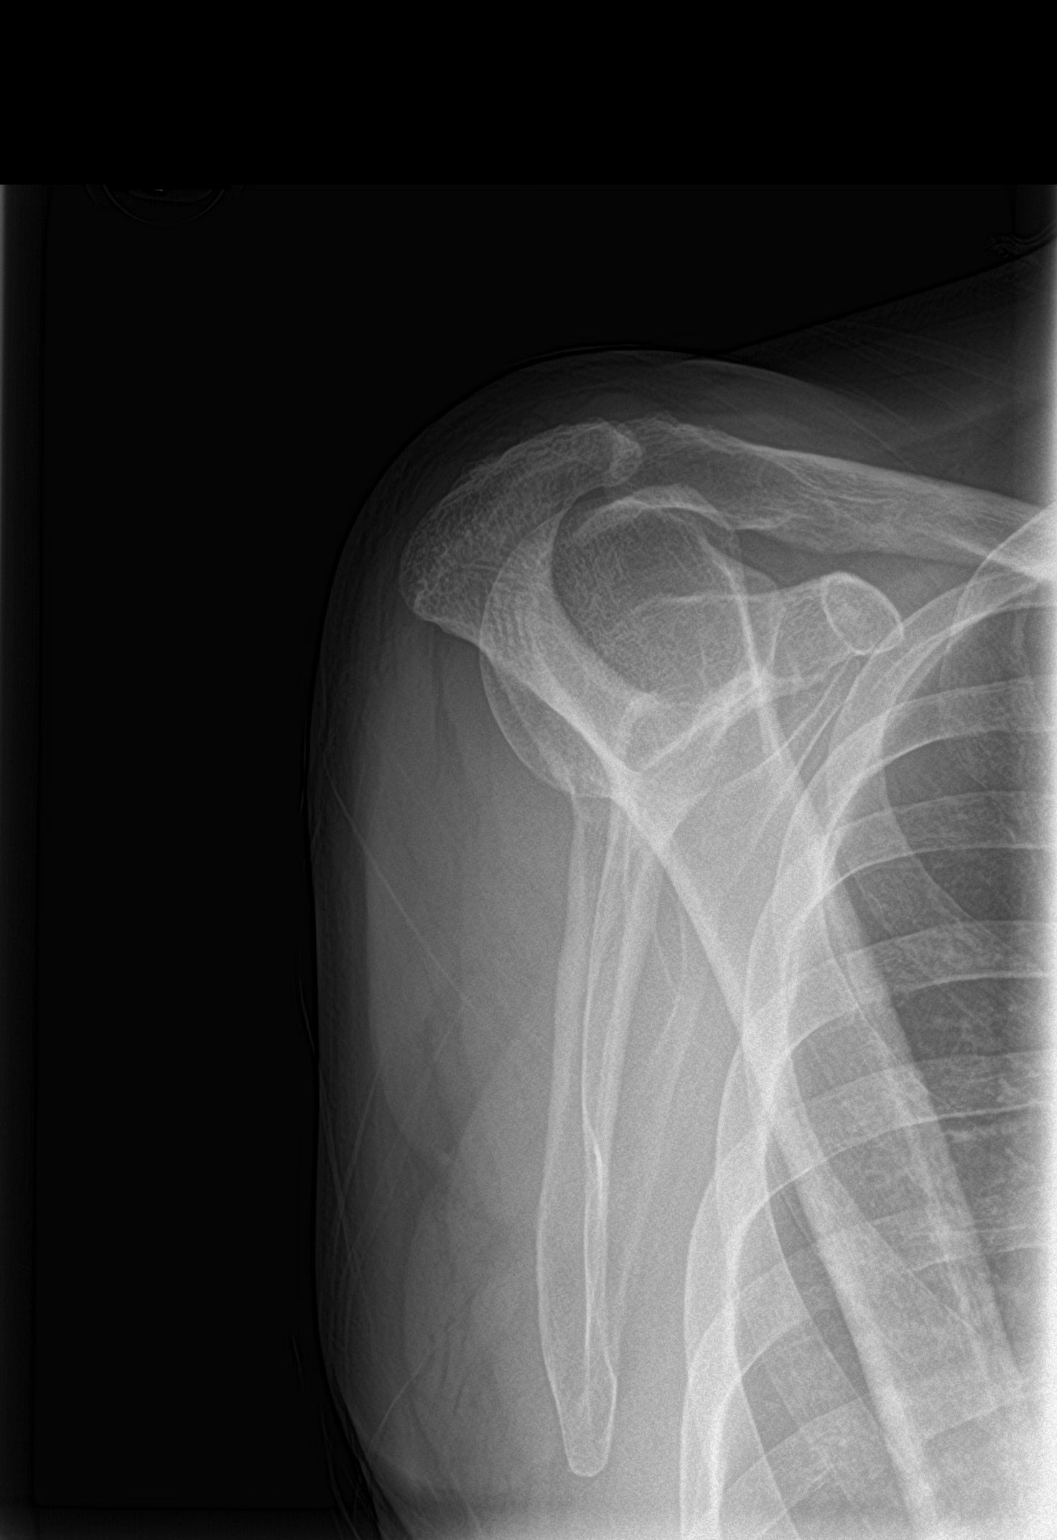
[im 2/3]
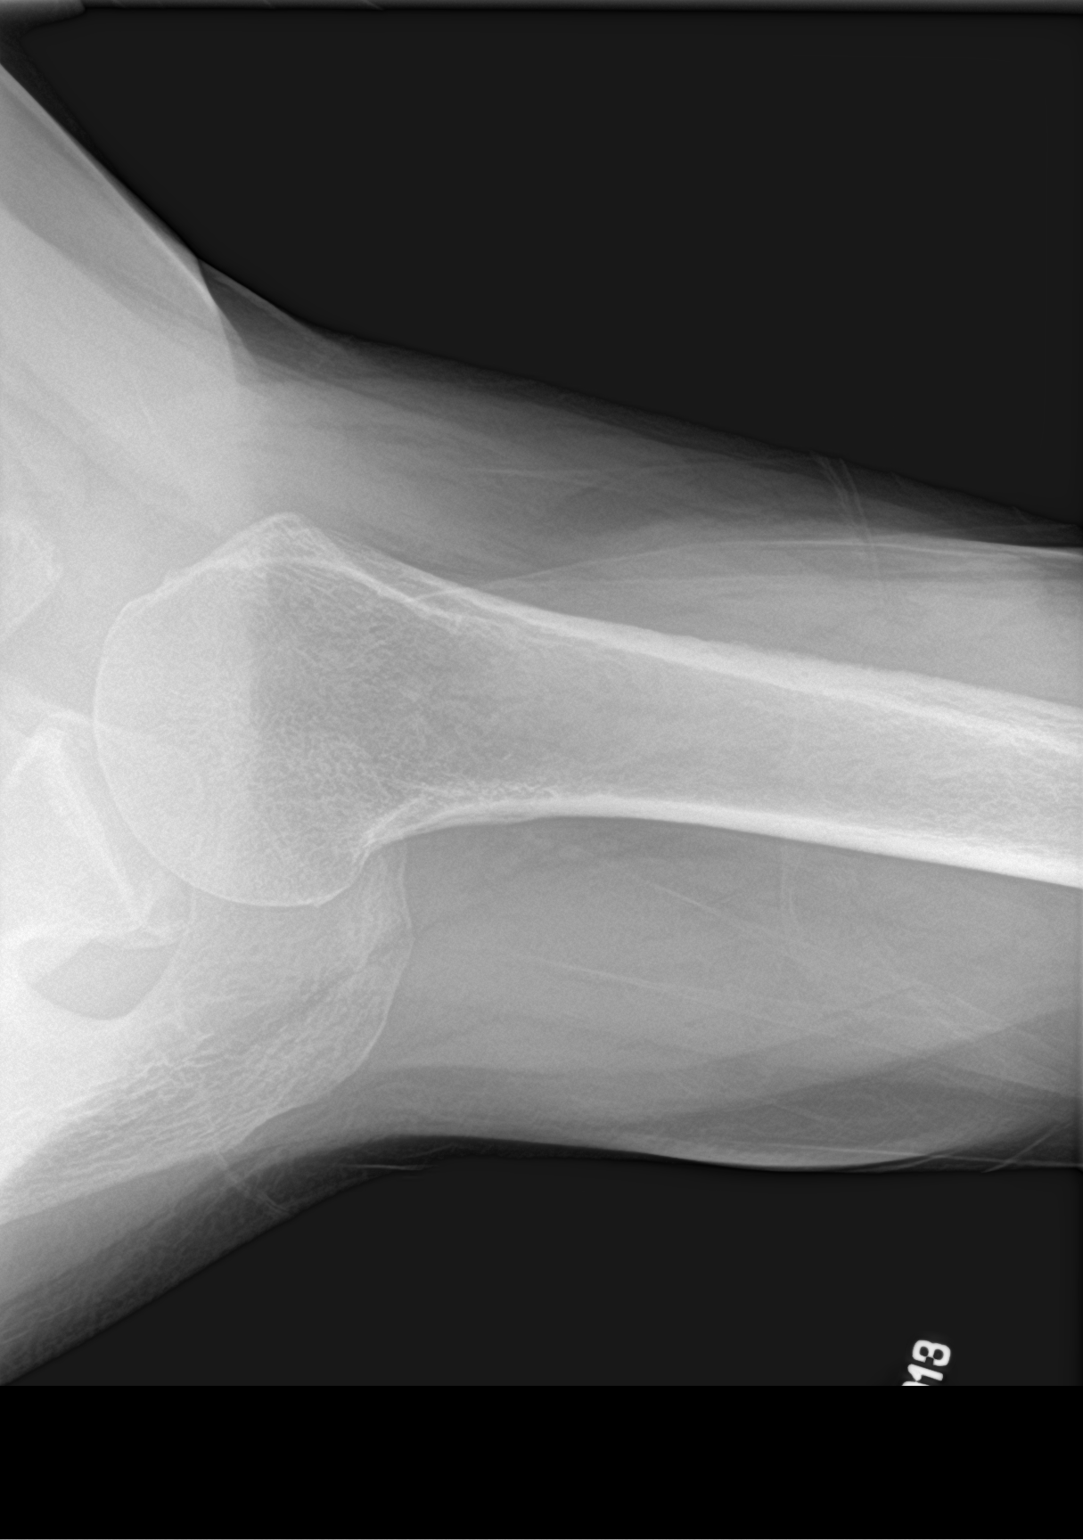
[im 3/3]
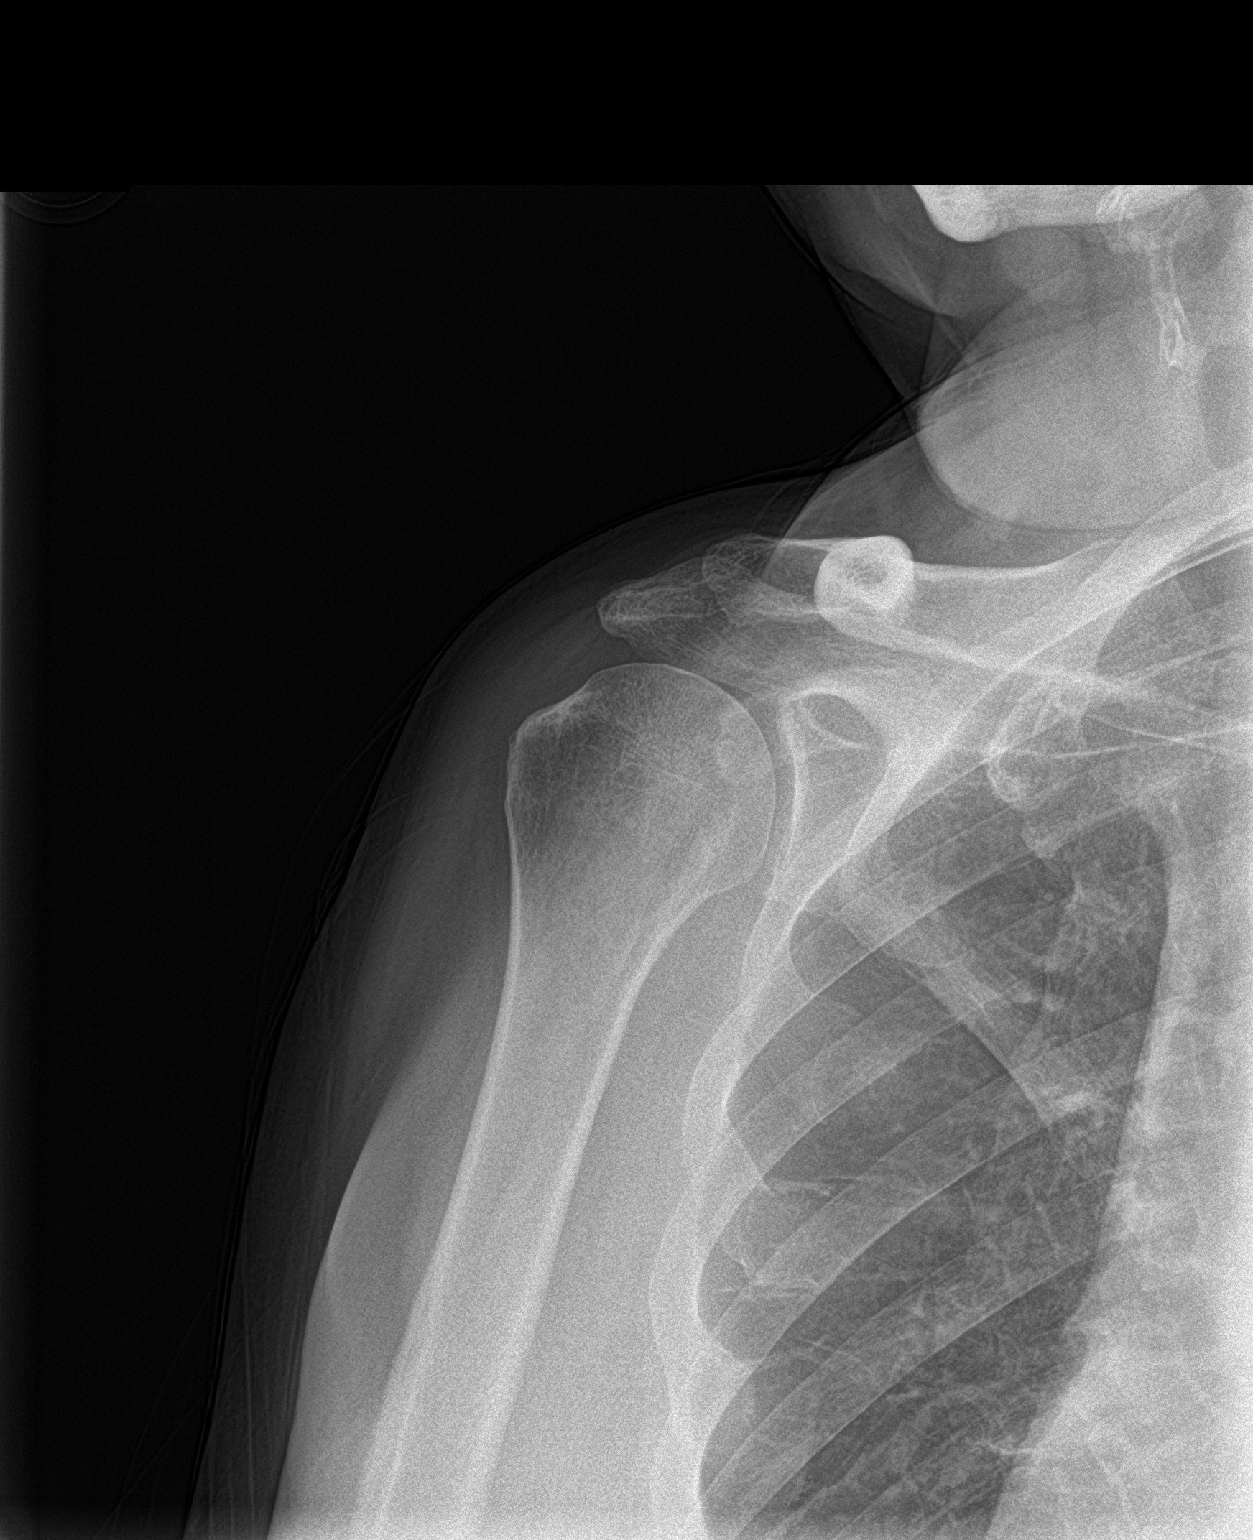

[3 of 3 positions shown; findings below may reference images not displayed]

FINDINGS: There is no evidence of fracture or dislocation. There is no
evidence of arthropathy or other focal bone abnormality. Soft
tissues are unremarkable.
IMPRESSION: Negative.

## 2020-09-24 NOTE — Telephone Encounter (Signed)
Called patient to inform him of his referral to [facility], [facility], or [facility] and left this message and the clinic's phone number with his wife.

## 2020-09-26 NOTE — Telephone Encounter (Signed)
Called patient and asked him if he had reached out to [facility] and he informed social work Theatre manager that he had found a place around the corner from him and was going there.  Patient could not recall the name of the facility.

## 2020-09-30 ENCOUNTER — Other Ambulatory Visit: Payer: Self-pay

## 2020-09-30 ENCOUNTER — Encounter: Payer: Self-pay | Admitting: Emergency Medicine

## 2020-09-30 ENCOUNTER — Emergency Department
Admission: EM | Admit: 2020-09-30 | Discharge: 2020-09-30 | Disposition: A | Discharge status: Home / Self Care | Payer: Medicaid Other | Attending: Emergency Medicine | Admitting: Emergency Medicine

## 2020-09-30 DIAGNOSIS — J45909 Unspecified asthma, uncomplicated: Secondary | ICD-10-CM | POA: Insufficient documentation

## 2020-09-30 DIAGNOSIS — F10930 Alcohol use, unspecified with withdrawal, uncomplicated: Secondary | ICD-10-CM

## 2020-09-30 DIAGNOSIS — F10230 Alcohol dependence with withdrawal, uncomplicated: Secondary | ICD-10-CM

## 2020-09-30 DIAGNOSIS — F10239 Alcohol dependence with withdrawal, unspecified: Secondary | ICD-10-CM | POA: Insufficient documentation

## 2020-09-30 DIAGNOSIS — Z79899 Other long term (current) drug therapy: Secondary | ICD-10-CM | POA: Insufficient documentation

## 2020-09-30 DIAGNOSIS — Z87891 Personal history of nicotine dependence: Secondary | ICD-10-CM | POA: Insufficient documentation

## 2020-09-30 DIAGNOSIS — I10 Essential (primary) hypertension: Secondary | ICD-10-CM | POA: Insufficient documentation

## 2020-09-30 LAB — CBC WITH DIFFERENTIAL/PLATELET
Abs Immature Granulocytes: 0.03 10*3/uL (ref 0.00–0.07)
Basophils Absolute: 0.1 10*3/uL (ref 0.0–0.1)
Basophils Relative: 1 %
Eosinophils Absolute: 0.1 10*3/uL (ref 0.0–0.5)
Eosinophils Relative: 1 %
HCT: 43.5 % (ref 39.0–52.0)
Hemoglobin: 13.7 g/dL (ref 13.0–17.0)
Immature Granulocytes: 0 %
Lymphocytes Relative: 12 %
Lymphs Abs: 1.1 10*3/uL (ref 0.7–4.0)
MCH: 28.5 pg (ref 26.0–34.0)
MCHC: 31.5 g/dL (ref 30.0–36.0)
MCV: 90.4 fL (ref 80.0–100.0)
Monocytes Absolute: 1.4 10*3/uL — ABNORMAL HIGH (ref 0.1–1.0)
Monocytes Relative: 16 %
Neutro Abs: 6.2 10*3/uL (ref 1.7–7.7)
Neutrophils Relative %: 70 %
Platelets: 229 10*3/uL (ref 150–400)
RBC: 4.81 MIL/uL (ref 4.22–5.81)
RDW: 16.6 % — ABNORMAL HIGH (ref 11.5–15.5)
WBC: 8.9 10*3/uL (ref 4.0–10.5)
nRBC: 0 % (ref 0.0–0.2)

## 2020-09-30 LAB — COMPREHENSIVE METABOLIC PANEL
ALT: 20 U/L (ref 0–44)
AST: 20 U/L (ref 15–41)
Albumin: 4.4 g/dL (ref 3.5–5.0)
Alkaline Phosphatase: 69 U/L (ref 38–126)
Anion gap: 16 — ABNORMAL HIGH (ref 5–15)
BUN: 19 mg/dL (ref 6–20)
CO2: 22 mmol/L (ref 22–32)
Calcium: 9.7 mg/dL (ref 8.9–10.3)
Chloride: 100 mmol/L (ref 98–111)
Creatinine, Ser: 1.26 mg/dL — ABNORMAL HIGH (ref 0.61–1.24)
GFR, Estimated: >60 mL/min (ref >60)
Glucose, Bld: 111 mg/dL — ABNORMAL HIGH (ref 70–99)
Potassium: 4.2 mmol/L (ref 3.5–5.1)
Sodium: 138 mmol/L (ref 135–145)
Total Bilirubin: 2.3 mg/dL — ABNORMAL HIGH (ref 0.3–1.2)
Total Protein: 7.9 g/dL (ref 6.5–8.1)

## 2020-09-30 LAB — LIPASE, BLOOD: Lipase: 20 U/L (ref 11–51)

## 2020-09-30 MED ORDER — CHLORDIAZEPOXIDE HCL 25 MG PO CAPS
25.0000 mg | ORAL_CAPSULE | Freq: Three times a day (TID) | ORAL | 0 refills | Status: AC | PRN
Start: 2020-09-30 — End: 2020-10-03

## 2020-09-30 MED ORDER — ONDANSETRON HCL 4 MG PO TABS
8.0000 mg | ORAL_TABLET | Freq: Once | ORAL | Status: AC
  Administered 2020-09-30: 8 mg via ORAL
  Filled 2020-09-30: qty 2

## 2020-09-30 MED ORDER — LORAZEPAM 2 MG PO TABS
2.0000 mg | ORAL_TABLET | Freq: Once | ORAL | Status: AC
  Administered 2020-09-30: 2 mg via ORAL
  Filled 2020-09-30: qty 1

## 2020-09-30 MED ORDER — SODIUM CHLORIDE 0.9 % IV BOLUS
1000.0000 mL | Freq: Once | INTRAVENOUS | Status: AC
  Administered 2020-09-30: 1000 mL via INTRAVENOUS

## 2020-09-30 NOTE — ED Triage Notes (Signed)
PT to ER states he is shaking all over and has not been able to sleep. Pt has hx of ETOH abuse, states had a few drinks on Saturday and none yesterday.  PT with notable tremor and increased anxiety.

## 2020-09-30 NOTE — ED Notes (Signed)
Patient's iv bolus completed, patient is calm and cooperative.

## 2020-09-30 NOTE — ED Notes (Signed)
Patient with nervous shakes, complained of being nausea , Nurse talked to the Doctor Siadaki and received order for ativan and zofran, Patient is cooperative, will continue to monitor.

## 2020-09-30 NOTE — ED Notes (Signed)
Patient's iv fluids started without difficulty, no signs of distress, bolus ordered of NS.

## 2020-09-30 NOTE — Discharge Instructions (Addendum)
Take the Librium as prescribed.  Do not use alcohol while taking the medication.  Return to the ER immediately for new or worsening tremors, shaking, weakness, anxiety, dizziness or lightheadedness, vomiting, hallucinations, or any other new or worsening symptoms that concern you.

## 2020-09-30 NOTE — ED Notes (Signed)
Patient moved to room 23, He is without any behavioral issues, calm and cooperative.

## 2020-09-30 NOTE — ED Provider Notes (Signed)
[facility] Emergency Department Provider Note ____________________________________________   First MD Initiated Contact with Patient 09/30/20 [phone]     (approximate)  I have reviewed the triage vital signs and the nursing notes.   HISTORY  Chief Complaint Alcohol Intoxication    HPI JEHAD BISONO is a 59 y.o. male with PMH as noted below including a history of alcohol abuse and anxiety who presents due to concern for alcohol withdrawal, with insomnia, vomiting, and feeling shaky over the last day.  The patient states that he drank heavily over several days until his last drink 2 days ago, but did not drink anything yesterday.  The patient states that he threw up multiple times yesterday and had some upper abdominal pain.  He felt very shaky and anxious.  He states that he was not able to sleep at all last night.  Past Medical History:  Diagnosis Date  . Alcohol abuse   . Anxiety   . Asthma   . Family history of breast cancer   . GERD (gastroesophageal reflux disease)   . Gout   . Hx of colonic polyps   . Hypertension   . Kidney stone   . 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 OCD (obsessive compulsive disorder)   . Renal colic     Patient Active Problem List   Diagnosis Date Noted  . Genetic testing 07/15/2020  . 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 Right ankle pain 06/26/2020  . Family history of breast cancer   . Hx of colonic polyps   . Hospital discharge follow-up 06/05/2020  . Kidney function test abnormal 06/05/2020  . Abdominal pain 04/12/2020  . Hypertensive urgency 04/12/2020  . Alcohol withdrawal ([place]) 03/04/2020  . Right-sided chest wall pain 02/28/2020  . Alcohol abuse with alcohol-induced mood disorder ([place]) 12/02/2019  . Moderate episode of recurrent major depressive disorder ([place])   . Health care maintenance 10/26/2019  . Right knee pain 10/26/2019  . Generalized anxiety disorder 10/14/2019    . MDD (major depressive disorder), recurrent severe, without psychosis ([place]) 07/27/2019  . Social anxiety disorder 07/27/2019  . Left-sided chest wall pain 06/14/2019  . Alcoholic cirrhosis of liver without ascites ([place]) 06/14/2019  . Alcohol abuse 06/14/2019  . AKI (acute kidney injury) ([place]) 05/27/2019  . Alcohol withdrawal syndrome without complication ([place]) 16/11/930  . Alcoholic intoxication without complication ([place])   . Sepsis ([place]) 03/08/2019  . Breast lump or mass 10/04/2018  . Sepsis secondary to UTI ([place]) 09/14/2018  . Asthma 07/07/2018  . GERD (gastroesophageal reflux disease) 07/07/2018  . OCD (obsessive compulsive disorder) 07/07/2018  . Self-inflicted laceration of left wrist ([place]) 03/10/2018  . Leg hematoma 12/25/2017  . Suicide and self-inflicted injury by cutting and piercing instrument ([place]) 03/10/2017  . Severe recurrent major 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 ([place]) 03/09/2017  . Substance induced mood disorder ([place]) 08/15/2016  . Involuntary commitment 08/15/2016  . Alcohol use disorder, severe, dependence ([place]) 02/05/2016  . Hypertension 12/05/2015  . Tachycardia 12/05/2015  . Gout 11/13/2015  . Chronic back pain 05/02/2015    Past Surgical History:  Procedure Laterality Date  . CHOLECYSTECTOMY  2012  . COLONOSCOPY    . COLONOSCOPY WITH PROPOFOL N/A 05/28/2020   Procedure: COLONOSCOPY WITH PROPOFOL;  Surgeon: [name], MD;  Location: [facility] ENDOSCOPY;  Service: Gastroenterology;  Laterality: N/A;  . EXTRACORPOREAL SHOCK WAVE LITHOTRIPSY Left 01/12/2019   Procedure: EXTRACORPOREAL SHOCK WAVE LITHOTRIPSY (ESWL);  Surgeon: Billey Co, MD;  Location: [facility] ORS;  Service: Urology;  Laterality: Left;  . UPPER GI ENDOSCOPY      Prior to Admission medications   Medication Sig Start Date End Date Taking? Authorizing Provider  albuterol (VENTOLIN HFA) 108 (90 Base) MCG/ACT inhaler Inhale 2 puffs into the lungs every 6 (six) hours as needed. 03/21/20    Clapacs, Madie Reno, MD  allopurinol (ZYLOPRIM) 300 MG tablet TAKE ONE TABLET BY MOUTH EVERY DAY 09/18/20   Iloabachie, Chioma E, NP  ascorbic acid (VITAMIN C) 500 MG tablet Take by mouth. 08/01/19   [provider]  chlordiazePOXIDE (LIBRIUM) 25 MG capsule Take 1-2 capsules (25-50 mg total) by mouth 3 (three) times daily as needed for up to 3 days for anxiety or withdrawal. 09/30/20 10/03/20  Arta Silence, MD  citalopram (CELEXA) 20 MG tablet Take 1 tablet (20 mg total) by mouth daily. Patient not taking: Reported on 09/18/2020 03/21/20 03/21/21  Clapacs, Madie Reno, MD  colchicine 0.6 MG tablet Take by mouth. 08/01/19   [provider]  Fluticasone-Salmeterol (ADVAIR DISKUS) 100-50 MCG/DOSE AEPB Inhale 1 puff into the lungs 2 (two) times daily. 03/21/20   Clapacs, Madie Reno, MD  folic acid (FOLVITE) 1 MG tablet Take 1 tablet (1 mg total) by mouth daily. 07/16/20   Iloabachie, Chioma E, NP  lisinopril (ZESTRIL) 40 MG tablet TAKE ONE TABLET BY MOUTH EVERY DAY 09/18/20   Iloabachie, Chioma E, NP  omeprazole (PRILOSEC) 10 MG capsule Take 1 capsule (10 mg total) by mouth daily. 04/21/20   Cuthriell, Charline Bills, PA-C  ondansetron (ZOFRAN ODT) 4 MG disintegrating tablet Allow 1-2 tablets to dissolve in your mouth every 8 hours as needed for nausea/vomiting 08/19/20   Hinda Kehr, MD  thiamine 100 MG tablet Take 1 tablet (100 mg total) by mouth daily. Patient not taking: Reported on 09/18/2020 03/22/20   Clapacs, Madie Reno, MD  traMADol (ULTRAM) 50 MG tablet Take 50 mg by mouth every 4 (four) hours. Patient not taking: Reported on 09/18/2020 08/07/20   [provider]    Allergies Percocet [oxycodone-acetaminophen]  Family History  Problem Relation Age of Onset  . Alcohol abuse Father   . Breast cancer Mother 64    Social History Social History   Tobacco Use  . Smoking status: Former Smoker    Types: Cigarettes  . Smokeless tobacco: Never Used  . Tobacco comment: quit 30 years ago    Vaping Use  . Vaping Use: Never used  Substance Use Topics  . Alcohol use: Yes    Alcohol/week: 12.0 standard drinks    Types: 12 Shots of liquor per week  . Drug use: No    Review of Systems  Constitutional: No fever. Eyes: No redness. ENT: No sore throat. Cardiovascular: Denies chest pain. Respiratory: Denies shortness of breath. Gastrointestinal: Positive for resolved vomiting. Genitourinary: Negative for dysuria.  Musculoskeletal: Negative for back pain. Skin: Negative for rash. Neurological: Negative for headache.   ____________________________________________   PHYSICAL EXAM:  VITAL SIGNS: ED Triage Vitals  Enc Vitals Group     BP 09/30/20 0747 (!) 157/90     Pulse Rate 09/30/20 0747 (!) 104     Resp 09/30/20 0747 18     Temp 09/30/20 0747 97.8 F (36.6 C)     Temp Source 09/30/20 0747 Oral     SpO2 09/30/20 0747 99 %     Weight 09/30/20 0748 189 lb (85.7 kg)     Height 09/30/20 0748  6' (1.829 m)     Head Circumference --      Peak Flow --      Pain Score 09/30/20 0748 8     Pain Loc --      Pain Edu? --      Excl. in [place]? --     Constitutional: Alert and oriented.  Slightly anxious appearing but in no acute distress. Eyes: Conjunctivae are normal.  Head: Atraumatic. Nose: No congestion/rhinnorhea. Mouth/Throat: Mucous membranes are moist.  Mild tongue fasciculation. Neck: Normal range of motion.  Cardiovascular: Borderline tachycardic, regular rhythm. Good peripheral circulation. Respiratory: Normal respiratory effort.  No retractions.  Gastrointestinal: No distention.  Musculoskeletal: Extremities warm and well perfused.  Neurologic:  Normal speech and language.  Mild tremor.  No gross focal neurologic deficits are appreciated.  Skin:  Skin is warm and dry. No rash noted. Psychiatric: Mood and affect are normal. Speech and behavior are normal.  ____________________________________________   LABS (all labs ordered are listed, but only abnormal  results are displayed)  Labs Reviewed  COMPREHENSIVE METABOLIC PANEL - Abnormal; Notable for the following components:      Result Value   Glucose, Bld 111 (*)    Creatinine, Ser 1.26 (*)    Total Bilirubin 2.3 (*)    Anion gap 16 (*)    All other components within normal limits  CBC WITH DIFFERENTIAL/PLATELET - Abnormal; Notable for the following components:   RDW 16.6 (*)    Monocytes Absolute 1.4 (*)    All other components within normal limits  LIPASE, BLOOD  ETHANOL   ____________________________________________  EKG   ____________________________________________  RADIOLOGY    ____________________________________________   PROCEDURES  Procedure(s) performed: No  Procedures  Critical Care performed: No ____________________________________________   INITIAL IMPRESSION / ASSESSMENT AND PLAN / ED COURSE  Pertinent labs & imaging results that were available during my care of the patient were reviewed by me and considered in my medical decision making (see chart for details).  59 year old male with PMH as noted above including a history of alcohol abuse and anxiety presents with concern for alcohol withdrawal.  The patient states that he does not always drink every day, but had been drinking heavily last week until 2 days ago.  He did not drink at all yesterday.  Symptoms include shaking, anxiety, insomnia, and vomiting.   I reviewed the past medical records in epic.  The patient has multiple prior visits with similar presentations for mild withdrawal, treated in the ED.  On exam he is overall well-appearing.  He is borderline tachycardic and hypertensive with otherwise normal vital signs.  He has mild tremor and tongue fasciculation.  The physical exam is otherwise unremarkable.  Overall presentation is consistent with mild alcohol withdrawal.  Especially given the recurrent vomiting and decreased p.o. intake yesterday, we will obtain labs to evaluate for  electrolyte abnormalities or AKI.  I will give a liter of normal saline, and treat with p.o. Ativan and Zofran.  ----------------------------------------- 1:22 PM on 09/30/2020 -----------------------------------------  The lab work-up overall is reassuring.  The patient's creatinine is improved from prior.  His anion gap is slightly elevated although this is consistent with dehydration.  He has received fluids and states he feels much better.  He is now tolerating p.o. food.  Therefore, there is no indication for repeat labs or evidence of significant AKA or other complication.  The patient states he feels well and would like to go home.  I will prescribe a short  taper of Librium, and instructed him on its use.  I provided a list of outpatient substance abuse resources.  I gave him thorough return precautions and he expressed understanding.  ____________________________________________   FINAL CLINICAL IMPRESSION(S) / ED DIAGNOSES  Final diagnoses:  Alcohol withdrawal syndrome without complication (HCC)      NEW MEDICATIONS STARTED DURING THIS VISIT:  New Prescriptions   CHLORDIAZEPOXIDE (LIBRIUM) 25 MG CAPSULE    Take 1-2 capsules (25-50 mg total) by mouth 3 (three) times daily as needed for up to 3 days for anxiety or withdrawal.     Note:  This document was prepared using Dragon voice recognition software and may include unintentional dictation errors.    Arta Silence, MD 09/30/20 1323

## 2020-10-03 ENCOUNTER — Other Ambulatory Visit: Payer: Self-pay

## 2020-10-03 ENCOUNTER — Emergency Department: Payer: Self-pay

## 2020-10-03 ENCOUNTER — Emergency Department
Admission: EM | Admit: 2020-10-03 | Discharge: 2020-10-03 | Disposition: A | Discharge status: Home / Self Care | Payer: No Typology Code available for payment source | Attending: Emergency Medicine | Admitting: Emergency Medicine

## 2020-10-03 DIAGNOSIS — R109 Unspecified abdominal pain: Secondary | ICD-10-CM | POA: Insufficient documentation

## 2020-10-03 DIAGNOSIS — Z79899 Other long term (current) drug therapy: Secondary | ICD-10-CM | POA: Insufficient documentation

## 2020-10-03 DIAGNOSIS — F10220 Alcohol dependence with intoxication, uncomplicated: Secondary | ICD-10-CM | POA: Diagnosis present

## 2020-10-03 DIAGNOSIS — S61512A Laceration without foreign body of left wrist, initial encounter: Secondary | ICD-10-CM | POA: Diagnosis not present

## 2020-10-03 DIAGNOSIS — J45909 Unspecified asthma, uncomplicated: Secondary | ICD-10-CM | POA: Insufficient documentation

## 2020-10-03 DIAGNOSIS — Z87891 Personal history of nicotine dependence: Secondary | ICD-10-CM | POA: Insufficient documentation

## 2020-10-03 DIAGNOSIS — X789XXA Intentional self-harm by unspecified sharp object, initial encounter: Secondary | ICD-10-CM | POA: Diagnosis not present

## 2020-10-03 DIAGNOSIS — F10129 Alcohol abuse with intoxication, unspecified: Secondary | ICD-10-CM | POA: Insufficient documentation

## 2020-10-03 DIAGNOSIS — M79601 Pain in right arm: Secondary | ICD-10-CM | POA: Insufficient documentation

## 2020-10-03 DIAGNOSIS — M79604 Pain in right leg: Secondary | ICD-10-CM | POA: Insufficient documentation

## 2020-10-03 DIAGNOSIS — F10920 Alcohol use, unspecified with intoxication, uncomplicated: Secondary | ICD-10-CM

## 2020-10-03 DIAGNOSIS — M79602 Pain in left arm: Secondary | ICD-10-CM | POA: Insufficient documentation

## 2020-10-03 DIAGNOSIS — I10 Essential (primary) hypertension: Secondary | ICD-10-CM | POA: Insufficient documentation

## 2020-10-03 DIAGNOSIS — F1020 Alcohol dependence, uncomplicated: Secondary | ICD-10-CM | POA: Diagnosis present

## 2020-10-03 DIAGNOSIS — M549 Dorsalgia, unspecified: Secondary | ICD-10-CM | POA: Insufficient documentation

## 2020-10-03 DIAGNOSIS — M79605 Pain in left leg: Secondary | ICD-10-CM | POA: Insufficient documentation

## 2020-10-03 DIAGNOSIS — F10930 Alcohol use, unspecified with withdrawal, uncomplicated: Secondary | ICD-10-CM

## 2020-10-03 DIAGNOSIS — R251 Tremor, unspecified: Secondary | ICD-10-CM | POA: Insufficient documentation

## 2020-10-03 DIAGNOSIS — Z20822 Contact with and (suspected) exposure to covid-19: Secondary | ICD-10-CM | POA: Insufficient documentation

## 2020-10-03 DIAGNOSIS — F10230 Alcohol dependence with withdrawal, uncomplicated: Secondary | ICD-10-CM

## 2020-10-03 LAB — CBC
HCT: 39.8 % (ref 39.0–52.0)
Hemoglobin: 12.6 g/dL — ABNORMAL LOW (ref 13.0–17.0)
MCH: 27.9 pg (ref 26.0–34.0)
MCHC: 31.7 g/dL (ref 30.0–36.0)
MCV: 88.1 fL (ref 80.0–100.0)
Platelets: 242 10*3/uL (ref 150–400)
RBC: 4.52 MIL/uL (ref 4.22–5.81)
RDW: 16.7 % — ABNORMAL HIGH (ref 11.5–15.5)
WBC: 7.3 10*3/uL (ref 4.0–10.5)
nRBC: 0 % (ref 0.0–0.2)

## 2020-10-03 LAB — RESP PANEL BY RT-PCR (FLU A&B, COVID) ARPGX2
Influenza A by PCR: NEGATIVE
Influenza B by PCR: NEGATIVE
SARS Coronavirus 2 by RT PCR: NEGATIVE

## 2020-10-03 LAB — LIPASE, BLOOD: Lipase: 32 U/L (ref 11–51)

## 2020-10-03 LAB — COMPREHENSIVE METABOLIC PANEL
ALT: 15 U/L (ref 0–44)
AST: 16 U/L (ref 15–41)
Albumin: 4.3 g/dL (ref 3.5–5.0)
Alkaline Phosphatase: 66 U/L (ref 38–126)
Anion gap: 13 (ref 5–15)
BUN: 17 mg/dL (ref 6–20)
CO2: 22 mmol/L (ref 22–32)
Calcium: 9.7 mg/dL (ref 8.9–10.3)
Chloride: 104 mmol/L (ref 98–111)
Creatinine, Ser: 1.1 mg/dL (ref 0.61–1.24)
GFR, Estimated: >60 mL/min (ref >60)
Glucose, Bld: 112 mg/dL — ABNORMAL HIGH (ref 70–99)
Potassium: 3.6 mmol/L (ref 3.5–5.1)
Sodium: 139 mmol/L (ref 135–145)
Total Bilirubin: 0.7 mg/dL (ref 0.3–1.2)
Total Protein: 7.2 g/dL (ref 6.5–8.1)

## 2020-10-03 LAB — ETHANOL: Alcohol, Ethyl (B): 238 mg/dL — ABNORMAL HIGH (ref <10)

## 2020-10-03 LAB — SALICYLATE LEVEL: Salicylate Lvl: <7 mg/dL — ABNORMAL LOW (ref 7.0–30.0)

## 2020-10-03 LAB — ACETAMINOPHEN LEVEL: Acetaminophen (Tylenol), Serum: <10 ug/mL — ABNORMAL LOW (ref 10–30)

## 2020-10-03 MED ORDER — ACETAMINOPHEN 325 MG PO TABS
650.0000 mg | ORAL_TABLET | Freq: Once | ORAL | Status: AC
  Administered 2020-10-03: 650 mg via ORAL
  Filled 2020-10-03: qty 2

## 2020-10-03 MED ORDER — IPRATROPIUM-ALBUTEROL 0.5-2.5 (3) MG/3ML IN SOLN
3.0000 mL | Freq: Once | RESPIRATORY_TRACT | Status: AC
  Administered 2020-10-03: 3 mL via RESPIRATORY_TRACT
  Filled 2020-10-03: qty 3

## 2020-10-03 MED ORDER — LORAZEPAM 2 MG/ML IJ SOLN
1.0000 mg | Freq: Once | INTRAMUSCULAR | Status: AC
  Administered 2020-10-03: 1 mg via INTRAVENOUS
  Filled 2020-10-03: qty 1

## 2020-10-03 MED ORDER — ONDANSETRON HCL 4 MG/2ML IJ SOLN
4.0000 mg | Freq: Once | INTRAMUSCULAR | Status: AC
  Administered 2020-10-03: 4 mg via INTRAVENOUS
  Filled 2020-10-03: qty 2

## 2020-10-03 MED ORDER — THIAMINE HCL 100 MG/ML IJ SOLN
Freq: Once | INTRAVENOUS | Status: AC
  Filled 2020-10-03: qty 1000

## 2020-10-03 NOTE — ED Notes (Signed)
Pt provided with meal tray and water per request

## 2020-10-03 NOTE — ED Triage Notes (Signed)
PT to ED c/o shaking, nausea and crying. PT states he is crying because he doesn't feel good. PT last drink was a shot this AM.

## 2020-10-03 NOTE — ED Notes (Signed)
Pt presents to ED tearful stating he doesn't know why he is crying. Pt also states nausea, tremors, and SOB. Pt has slight exp wheezes, pt states a HX of asthma and also alcohol abuse, pt states he mostly drinks daily but states sometimes he skips a few days. Pt states last drink was a shot this morning. Pt is A&Ox4 but is slightly slurring his words at this time. No distress noted at this time. Pt brought to RM 4 to administer breathing [place]

## 2020-10-03 NOTE — ED Notes (Signed)
Pt disconnected from monitor. Ambulatory to restroom. Reconnected to monitor. Denies further needs at this time.

## 2020-10-03 NOTE — BH Assessment (Addendum)
Assessment Note  Miguel [name] is an 59 y.o. male who presents to the ER due to the effects of his alcohol use. Patient states he drinks a pint of alcohol within one to two days. The days he doesn't drink "that much", he drinks "airplane bottles." He reports the longest he's been sober is "a couple of weeks to about a month or so." Patient reports his past symptoms of withdrawals have been shakes, some nausea, increase heart rate, cold chills, and sweats. He denies history of seizures.  Patient is well known to the ER for similar presentations. While in the ER, he usually voices the need for inpatient treatment but doesn't want to follow through with going to a facility, because he helps take care of his wife. He states, he doesn't want his wife to be home alone, because he's afraid she's going to fall or need assistance.  However, with this ER visit, he's asking more questions and voicing interest in going to a facility. However, he says, he needs to arrange for someone to be at the home with his wife, during the times the nursing aids aren't there.   During the interview, the patient was calm, cooperative and pleasant. He was able to provide appropriate answers to the questions. He denies SI/HI and AV/H. He denies history of violence and aggression. He also denies the use of any other mind-altering substances, other than alcohol.  Diagnosis: Alcohol Use Disorder  Past Medical History:  Past Medical History:  Diagnosis Date  . Alcohol abuse   . Anxiety   . Asthma   . Family history of breast cancer   . GERD (gastroesophageal reflux disease)   . Gout   . Hx of colonic polyps   . Hypertension   . Kidney stone   . 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 OCD (obsessive compulsive disorder)   . Renal colic     Past Surgical History:  Procedure Laterality Date  . CHOLECYSTECTOMY  2012  . COLONOSCOPY    . COLONOSCOPY WITH PROPOFOL N/A 05/28/2020   Procedure: COLONOSCOPY WITH  PROPOFOL;  Surgeon: [name], MD;  Location: [facility] ENDOSCOPY;  Service: Gastroenterology;  Laterality: N/A;  . EXTRACORPOREAL SHOCK WAVE LITHOTRIPSY Left 01/12/2019   Procedure: EXTRACORPOREAL SHOCK WAVE LITHOTRIPSY (ESWL);  Surgeon: Billey Co, MD;  Location: [facility] ORS;  Service: Urology;  Laterality: Left;  . UPPER GI ENDOSCOPY      Family History:  Family History  Problem Relation Age of Onset  . Alcohol abuse Father   . Breast cancer Mother 42    Social History:  reports that he has quit smoking. His smoking use included cigarettes. He has never used smokeless tobacco. He reports current alcohol use of about 12.0 standard drinks of alcohol per week. He reports that he does not use drugs.  Additional Social History:  Alcohol / Drug Use Pain Medications: See PTA Prescriptions: See PTA Over the Counter: See PTA History of alcohol / drug use?: Yes Longest period of sobriety (when/how long): Unable to quantify Withdrawal Symptoms: Nausea / Vomiting, Weakness, Sweats, Tremors Substance #1 Name of Substance 1: Alcohol 1 - Amount (size/oz): 1 Pint 1 - Frequency: Daily 1 - Duration: Unable to quantify 1 - Last Use / Amount: 10/03/2020  CIWA: CIWA-Ar BP: 130/84 Pulse Rate: 100 COWS:    Allergies:  Allergies  Allergen Reactions  . Percocet [Oxycodone-Acetaminophen] Nausea And Vomiting    Home Medications: (Not in a hospital admission)  OB/GYN Status:  No LMP for male patient.  General Assessment Data Location of Assessment: [facility] ED TTS Assessment: In system Is this a Tele or Face-to-Face Assessment?: Face-to-Face Is this an Initial Assessment or a Re-assessment for this encounter?: Initial Assessment Patient Accompanied by:: N/A Language Other than English: No Living Arrangements: Other (Comment) (Private Home) What gender do you identify as?: Male Date Telepsych consult ordered in [facility]: 10/03/20 Time Telepsych consult ordered in [facility]: [place] Marital status:  Married Pregnancy Status: No Living Arrangements: Spouse/significant other Can pt return to current living arrangement?: Yes Admission Status: Voluntary Is patient capable of signing voluntary admission?: Yes Referral Source: Self/Family/Friend Insurance type: None  Medical Screening Exam ([place]) Medical Exam completed: Yes  Crisis Care Plan Living Arrangements: Spouse/significant other Legal Guardian: Other: (Self) Name of Psychiatrist: Reports of none Name of Therapist: [facility]  Education Status Is patient currently in school?: No Is the patient employed, unemployed or receiving disability?: Unemployed  Risk to self with the past 6 months Suicidal Ideation: No Has patient been a risk to self within the past 6 months prior to admission? : No Suicidal Intent: No Has patient had any suicidal intent within the past 6 months prior to admission? : No Is patient at risk for suicide?: No Suicidal Plan?: No Has patient had any suicidal plan within the past 6 months prior to admission? : No Access to Means: No What has been your use of drugs/alcohol within the last 12 months?: Alcohol Previous Attempts/Gestures: No How many times?: 0 Other Self Harm Risks: Alcohol Abuse Triggers for Past Attempts: None known Intentional Self Injurious Behavior: None Family Suicide History: Unknown Recent stressful life event(s): Other (Comment) Persecutory voices/beliefs?: No Depression: Yes Depression Symptoms: Isolating Substance abuse history and/or treatment for substance abuse?: Yes Suicide prevention information given to non-admitted patients: Not applicable  Risk to Others within the past 6 months Homicidal Ideation: No Does patient have any lifetime risk of violence toward others beyond the six months prior to admission? : No Thoughts of Harm to Others: No Current Homicidal Intent: No Current Homicidal Plan: No Access to Homicidal Means: No Identified Victim: Reports of  none History of harm to others?: No Assessment of Violence: None Noted Violent Behavior Description: Reports of none Does patient have access to weapons?: No Criminal Charges Pending?: No Does patient have a court date: No Is patient on probation?: No  Psychosis Hallucinations: None noted Delusions: None noted  Mental Status Report Appearance/Hygiene: Unremarkable, In scrubs Eye Contact: Good Motor Activity: Freedom of movement, Unremarkable Speech: Logical/coherent, Unremarkable Level of Consciousness: Alert Mood: Anxious, Sad, Pleasant Affect: Appropriate to circumstance, Sad Anxiety Level: Minimal Thought Processes: Coherent, Relevant Judgement: Partial Orientation: Person, Place, Time, Situation, Appropriate for developmental age Obsessive Compulsive Thoughts/Behaviors: None  Cognitive Functioning Concentration: Normal Memory: Recent Intact, Remote Intact Is patient IDD: No Insight: Fair Impulse Control: Fair Appetite: Poor Have you had any weight changes? : No Change Sleep: No Change Total Hours of Sleep: 6 Vegetative Symptoms: None  ADLScreening [facility] Assessment Services) Patient's cognitive ability adequate to safely complete daily activities?: Yes Patient able to express need for assistance with ADLs?: Yes Independently performs ADLs?: Yes (appropriate for developmental age)  Prior Inpatient Therapy Prior Inpatient Therapy: Yes Prior Therapy Dates: 07/2019, 03/2020 Prior Therapy Facilty/Provider(s): [facility] Reason for Treatment: Depression  Prior Outpatient Therapy Prior Outpatient Therapy: Yes Prior Therapy Dates: Current Prior Therapy Facilty/Provider(s): [facility] Reason for Treatment: Alcohol abuse Does patient have an ACCT team?: No Does  patient have Intensive In-House Services?  : No Does patient have [facility] services? : No Does patient have P4CC services?: No  ADL Screening (condition at time of admission) Patient's cognitive ability adequate to  safely complete daily activities?: Yes Is the patient deaf or have difficulty hearing?: No Does the patient have difficulty seeing, even when wearing glasses/contacts?: No Does the patient have difficulty concentrating, remembering, or making decisions?: No Patient able to express need for assistance with ADLs?: Yes Does the patient have difficulty dressing or bathing?: No Independently performs ADLs?: Yes (appropriate for developmental age) Does the patient have difficulty walking or climbing stairs?: No Weakness of Legs: None Weakness of Arms/Hands: None  Home Assistive Devices/Equipment Home Assistive Devices/Equipment: None  Therapy Consults (therapy consults require a physician order) PT Evaluation Needed: No OT Evalulation Needed: No SLP Evaluation Needed: No Abuse/Neglect Assessment (Assessment to be complete while patient is alone) Abuse/Neglect Assessment Can Be Completed: Yes Physical Abuse: Denies Verbal Abuse: Denies Sexual Abuse: Denies Exploitation of patient/patient's resources: Denies Self-Neglect: Denies Values / Beliefs Cultural Requests During Hospitalization: None Spiritual Requests During Hospitalization: None Consults Spiritual Care Consult Needed: No Transition of Care Team Consult Needed: No Advance Directives (For Healthcare) Does Patient Have a Medical Advance Directive?: No Would patient like information on creating a medical advance directive?: No - Patient declined  Disposition:  Disposition Initial Assessment Completed for this Encounter: Yes  On Site Evaluation by:   Reviewed with Physician:  Gunnar Fusi MS, LCAS, [facility], [place] Therapeutic Triage Specialist 10/03/2020 6:12 PM

## 2020-10-03 NOTE — Discharge Instructions (Addendum)
Please be careful with your drinking.  Please try to limit it.  Please return for any increasing shortness of breath or fever or feeling sick at all.  Remember we are always open and we are always here.  Please follow-up with your primary care doctor in the next couple weeks to make sure that the hazy spot on your left lung has resolved.

## 2020-10-03 NOTE — Consult Note (Signed)
[facility] Consult   Reason for Consult:   Consult for patient with longstanding alcohol abuse presents intoxicated Referring Physician: Rip Harbour Patient Identification: [name] MRN:  [id number] Principal Diagnosis: Alcohol use disorder, severe, dependence ([place]) Diagnosis:  Principal Problem:   Alcohol use disorder, severe, dependence ([place])   Total Time spent with patient: 45 minutes  Subjective:   Miguel [name] is a 59 y.o. male patient admitted with "I am back again".  HPI: Patient seen chart reviewed.  Patient known from multiple prior encounters.  Patient is a 59 year old man with a longstanding problem with alcohol abuse.  Presents to the emergency room with physical complaints abdominal pain fatigue coughing but also intoxicated sad and upset and talking about needing detox.  Patient reports that he continues to drink daily.  As always he sees this as his only possible way of dealing with the stress of taking care of his wife who has multiple sclerosis.  Patient's mood is down hopeless sad.  No suicidal intent or plan.  No hallucinations no psychosis.  He seems to have very little idea about even the possibility of stopping drinking.  Past Psychiatric History: Multiple hospitalizations and emergency room visits for alcohol abuse little participation in sustained sobriety  Risk to Self:   Risk to Others:   Prior Inpatient Therapy:   Prior Outpatient Therapy:    Past Medical History:  Past Medical History:  Diagnosis Date  . Alcohol abuse   . Anxiety   . Asthma   . Family history of breast cancer   . GERD (gastroesophageal reflux disease)   . Gout   . Hx of colonic polyps   . Hypertension   . Kidney stone   . 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 OCD (obsessive compulsive disorder)   . Renal colic     Past Surgical History:  Procedure Laterality Date  . CHOLECYSTECTOMY  2012  . COLONOSCOPY    . COLONOSCOPY WITH PROPOFOL N/A 05/28/2020    Procedure: COLONOSCOPY WITH PROPOFOL;  Surgeon: [name], MD;  Location: [facility] ENDOSCOPY;  Service: Gastroenterology;  Laterality: N/A;  . EXTRACORPOREAL SHOCK WAVE LITHOTRIPSY Left 01/12/2019   Procedure: EXTRACORPOREAL SHOCK WAVE LITHOTRIPSY (ESWL);  Surgeon: Billey Co, MD;  Location: [facility] ORS;  Service: Urology;  Laterality: Left;  . UPPER GI ENDOSCOPY     Family History:  Family History  Problem Relation Age of Onset  . Alcohol abuse Father   . Breast cancer Mother 1   Family Psychiatric  History: See previous.  Family history of alcohol abuse Social History:  Social History   Substance and Sexual Activity  Alcohol Use Yes  . Alcohol/week: 12.0 standard drinks  . Types: 12 Shots of liquor per week     Social History   Substance and Sexual Activity  Drug Use No    Social History   Socioeconomic History  . Marital status: Married    Spouse name: Not on file  . Number of children: Not on file  . Years of education: Not on file  . Highest education level: Not on file  Occupational History  . Not on file  Tobacco Use  . Smoking status: Former Smoker    Types: Cigarettes  . Smokeless tobacco: Never Used  . Tobacco comment: quit 30 years ago  Vaping Use  . Vaping Use: Never used  Substance and Sexual Activity  . Alcohol use: Yes    Alcohol/week: 12.0 standard drinks  Types: 12 Shots of liquor per week  . Drug use: No  . Sexual activity: Not on file  Other Topics Concern  . Not on file  Social History Narrative   Lives at home with his wife and takes care of his wife. Independent at baseline      - Biggest strain is financial but doesn't think they could got get more help.      Patient expressed interest in possible financial assistance. Informed written consent obtained   Social Determinants of Health   Financial Resource Strain: High Risk  . Difficulty of Paying Living Expenses: Very hard  Food Insecurity: No Food Insecurity  . Worried About  Charity fundraiser in the Last Year: Never true  . Ran Out of Food in the Last Year: Never true  Transportation Needs: No Transportation Needs  . Lack of Transportation (Medical): No  . Lack of Transportation (Non-Medical): No  Physical Activity: Sufficiently Active  . Days of Exercise per Week: 7 days  . Minutes of Exercise per Session: 60 min  Stress: Stress Concern Present  . Feeling of Stress : Very much  Social Connections: Socially Isolated  . Frequency of Communication with Friends and Family: Once a week  . Frequency of Social Gatherings with Friends and Family: Once a week  . Attends Religious Services: Never  . Active Member of Clubs or Organizations: No  . Attends Archivist Meetings: Never  . Marital Status: Married   Additional Social History:    Allergies:   Allergies  Allergen Reactions  . Percocet [Oxycodone-Acetaminophen] Nausea And Vomiting    Labs:  Results for orders placed or performed during the hospital encounter of 10/03/20 (from the past 48 hour(s))  Lipase, blood     Status: None   Collection Time: 10/03/20  3:08 PM  Result Value Ref Range   Lipase 32 11 - 51 U/L    Comment: Performed at [facility], [address] [place], [place] [address]  Comprehensive metabolic panel     Status: Abnormal   Collection Time: 10/03/20  3:08 PM  Result Value Ref Range   Sodium 139 135 - 145 mmol/L   Potassium 3.6 3.5 - 5.1 mmol/L   Chloride 104 98 - 111 mmol/L   CO2 22 22 - 32 mmol/L   Glucose, Bld 112 (H) 70 - 99 mg/dL    Comment: Glucose reference range applies only to samples taken after fasting for at least 8 hours.   BUN 17 6 - 20 mg/dL   Creatinine, Ser 1.10 0.61 - 1.24 mg/dL   Calcium 9.7 8.9 - 10.3 mg/dL   Total Protein 7.2 6.5 - 8.1 g/dL   Albumin 4.3 3.5 - 5.0 g/dL   AST 16 15 - 41 U/L   ALT 15 0 - 44 U/L   Alkaline Phosphatase 66 38 - 126 U/L   Total Bilirubin 0.7 0.3 - 1.2 mg/dL   GFR, Estimated >60 >60 mL/min     Comment: (NOTE) Calculated using the CKD-EPI Creatinine Equation (2021)    Anion gap 13 5 - 15    Comment: Performed at [facility], [address] [name], [place] [address]  CBC     Status: Abnormal   Collection Time: 10/03/20  3:08 PM  Result Value Ref Range   WBC 7.3 4.0 - 10.5 K/uL   RBC 4.52 4.22 - 5.81 MIL/uL   Hemoglobin 12.6 (L) 13.0 - 17.0 g/dL   HCT 39.8 39 -  52 %   MCV 88.1 80.0 - 100.0 fL   MCH 27.9 26.0 - 34.0 pg   MCHC 31.7 30.0 - 36.0 g/dL   RDW 16.7 (H) 11.5 - 15.5 %   Platelets 242 150 - 400 K/uL   nRBC 0.0 0.0 - 0.2 %    Comment: Performed at [facility], [address] [place], [place] [address]  Salicylate level     Status: Abnormal   Collection Time: 10/03/20  3:09 PM  Result Value Ref Range   Salicylate Lvl <8.2 (L) 7.0 - 30.0 mg/dL    Comment: Performed at [facility], [address] [place], [place] [address]  Ethanol     Status: Abnormal   Collection Time: 10/03/20  3:09 PM  Result Value Ref Range   Alcohol, Ethyl (B) 238 (H) <10 mg/dL    Comment: (NOTE) Lowest detectable limit for serum alcohol is 10 mg/dL.  For medical purposes only. Performed at [facility], [address] [place], [place] [address]   Acetaminophen level     Status: Abnormal   Collection Time: 10/03/20  3:09 PM  Result Value Ref Range   Acetaminophen (Tylenol), Serum <10 (L) 10 - 30 ug/mL    Comment: (NOTE) Therapeutic concentrations vary significantly. A range of 10-30 ug/mL  may be an effective concentration for many patients. However, some  are best treated at concentrations outside of this range. Acetaminophen concentrations >150 ug/mL at 4 hours after ingestion  and >50 ug/mL at 12 hours after ingestion are often associated with  toxic reactions.  Performed at [facility], [address] [place], [place] [address]     Current Facility-Administered Medications  Medication Dose Route Frequency Provider Last  Rate Last Admin  . sodium chloride 0.9 % 1,000 mL with thiamine 100 mg, multivitamins adult 10 mL infusion   Intravenous Once Nena Polio, MD       Current Outpatient Medications  Medication Sig Dispense Refill  . albuterol (VENTOLIN HFA) 108 (90 Base) MCG/ACT inhaler Inhale 2 puffs into the lungs every 6 (six) hours as needed. 6.7 g 1  . allopurinol (ZYLOPRIM) 300 MG tablet TAKE ONE TABLET BY MOUTH EVERY DAY 90 tablet 0  . ascorbic acid (VITAMIN C) 500 MG tablet Take by mouth.    . chlordiazePOXIDE (LIBRIUM) 25 MG capsule Take 1-2 capsules (25-50 mg total) by mouth 3 (three) times daily as needed for up to 3 days for anxiety or withdrawal. 10 capsule 0  . citalopram (CELEXA) 20 MG tablet Take 1 tablet (20 mg total) by mouth daily. (Patient not taking: Reported on 09/18/2020) 30 tablet 1  . colchicine 0.6 MG tablet Take by mouth.    . Fluticasone-Salmeterol (ADVAIR DISKUS) 100-50 MCG/DOSE AEPB Inhale 1 puff into the lungs 2 (two) times daily. 712 each 0  . folic acid (FOLVITE) 1 MG tablet Take 1 tablet (1 mg total) by mouth daily. 30 tablet 3  . lisinopril (ZESTRIL) 40 MG tablet TAKE ONE TABLET BY MOUTH EVERY DAY 90 tablet 0  . omeprazole (PRILOSEC) 10 MG capsule Take 1 capsule (10 mg total) by mouth daily. 30 capsule 4  . ondansetron (ZOFRAN ODT) 4 MG disintegrating tablet Allow 1-2 tablets to dissolve in your mouth every 8 hours as needed for nausea/vomiting 30 tablet 0  . thiamine 100 MG tablet Take 1 tablet (100 mg total) by mouth daily. (Patient not taking: Reported on 09/18/2020) 30 tablet 1  . traMADol (ULTRAM) 50 MG tablet  Take 50 mg by mouth every 4 (four) hours. (Patient not taking: Reported on 09/18/2020)      Musculoskeletal: Strength & Muscle Tone: within normal limits Gait & Station: normal Patient leans: N/A  Psychiatric Specialty Exam: Physical Exam Vitals and nursing note reviewed.  Constitutional:      Appearance: He is well-developed.  HENT:     Head: Normocephalic  and atraumatic.  Eyes:     Conjunctiva/sclera: Conjunctivae normal.     Pupils: Pupils are equal, round, and reactive to light.  Cardiovascular:     Heart sounds: Normal heart sounds.  Pulmonary:     Effort: Pulmonary effort is normal.  Abdominal:     Palpations: Abdomen is soft.  Musculoskeletal:        General: Normal range of motion.     Cervical back: Normal range of motion.  Skin:    General: Skin is warm and dry.  Neurological:     General: No focal deficit present.     Mental Status: He is alert.  Psychiatric:        Attention and Perception: Attention normal.        Mood and Affect: Mood is anxious and depressed.        Speech: Speech is delayed.        Behavior: Behavior is slowed.        Thought Content: Thought content is not paranoid. Thought content does not include homicidal or suicidal ideation.        Cognition and Memory: Memory is impaired.        Judgment: Judgment is impulsive.     Review of Systems  Constitutional: Negative.   HENT: Negative.   Eyes: Negative.   Respiratory: Negative.   Cardiovascular: Negative.   Gastrointestinal: Negative.   Musculoskeletal: Negative.   Skin: Negative.   Neurological: Negative.   Psychiatric/Behavioral: Positive for dysphoric mood. Negative for suicidal ideas.    Blood pressure 130/84, pulse 100, temperature 98.2 F (36.8 C), temperature source Oral, resp. rate 20, height 6' (1.829 m), weight 83.9 kg, SpO2 97 %.Body mass index is 25.09 kg/m.  General Appearance: Casual  Eye Contact:  Fair  Speech:  Slow  Volume:  Decreased  Mood:  Depressed  Affect:  Constricted  Thought Process:  Coherent  Orientation:  Full (Time, Place, and Person)  Thought Content:  Rumination  Suicidal Thoughts:  No  Homicidal Thoughts:  No  Memory:  Immediate;   Fair Recent;   Fair Remote;   Fair  Judgement:  Fair  Insight:  Fair  Psychomotor Activity:  Decreased  Concentration:  Concentration: Fair  Recall:  [employer] of  Knowledge:  Fair  Language:  Fair  Akathisia:  No  Handed:  Right  AIMS (if indicated):     Assets:  Desire for Improvement  ADL's:  Intact  Cognition:  Impaired,  Mild  Sleep:        Treatment Plan Summary: Plan 59 year old man with a history of alcohol abuse.  I do not believe he has a history of seizures or DTs.  Often gets tremulous for a day or so in detox.  Despite his frequent presentations for this problem he seems to have very little concept of how to remain sober.  Does not participate in Alcoholics Anonymous.  Seems to focus on the idea that a pill of some sort is going to take care of his problems.  Mood is always sad and down while intoxicated and tends to improve  to his baseline once he is sober.  No sign of suicidality.  Does not require inpatient psychiatric admission.  Unfortunately he does not qualify for going to residential treatment services for detox because of his insurance.  TTS will probably be seeing the patient.  If any ideas come up about other options patient could certainly try pursuing them.  I spent some time trying to give him some encouragement and reminding him that the decision to stop drinking is always in his grasp and something he can get help with if he chooses to do so.  No indication to put in any new medication orders at this point.  Disposition: Supportive therapy provided about ongoing stressors.  Alethia Berthold, MD 10/03/2020 5:37 PM

## 2020-10-03 NOTE — ED Triage Notes (Signed)
First RN note: pt presents to ED via POV with c/o dizziness and fall x 2. Pt states "I just Cree't feel good".

## 2020-10-03 NOTE — ED Notes (Signed)
Pt currently eating dinner tray with no difficulty.
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
## 2020-10-03 NOTE — ED Notes (Signed)
Psych at bedside.

## 2020-10-04 ENCOUNTER — Other Ambulatory Visit: Payer: Self-pay

## 2020-10-04 ENCOUNTER — Encounter: Payer: Self-pay | Admitting: Emergency Medicine

## 2020-10-04 ENCOUNTER — Emergency Department
Admission: EM | Admit: 2020-10-04 | Discharge: 2020-10-04 | Disposition: A | Discharge status: Home / Self Care | Payer: Medicaid Other | Attending: Emergency Medicine | Admitting: Emergency Medicine

## 2020-10-04 DIAGNOSIS — J45909 Unspecified asthma, uncomplicated: Secondary | ICD-10-CM | POA: Insufficient documentation

## 2020-10-04 DIAGNOSIS — K219 Gastro-esophageal reflux disease without esophagitis: Secondary | ICD-10-CM | POA: Insufficient documentation

## 2020-10-04 DIAGNOSIS — Z7951 Long term (current) use of inhaled steroids: Secondary | ICD-10-CM | POA: Insufficient documentation

## 2020-10-04 DIAGNOSIS — F1010 Alcohol abuse, uncomplicated: Secondary | ICD-10-CM | POA: Diagnosis present

## 2020-10-04 DIAGNOSIS — S31119A Laceration without foreign body of abdominal wall, unspecified quadrant without penetration into peritoneal cavity, initial encounter: Secondary | ICD-10-CM | POA: Insufficient documentation

## 2020-10-04 DIAGNOSIS — X789XXA Intentional self-harm by unspecified sharp object, initial encounter: Secondary | ICD-10-CM

## 2020-10-04 DIAGNOSIS — S51812A Laceration without foreign body of left forearm, initial encounter: Secondary | ICD-10-CM | POA: Insufficient documentation

## 2020-10-04 DIAGNOSIS — S61512A Laceration without foreign body of left wrist, initial encounter: Secondary | ICD-10-CM

## 2020-10-04 DIAGNOSIS — Z87891 Personal history of nicotine dependence: Secondary | ICD-10-CM | POA: Insufficient documentation

## 2020-10-04 DIAGNOSIS — I10 Essential (primary) hypertension: Secondary | ICD-10-CM | POA: Insufficient documentation

## 2020-10-04 DIAGNOSIS — F1014 Alcohol abuse with alcohol-induced mood disorder: Secondary | ICD-10-CM | POA: Diagnosis present

## 2020-10-04 DIAGNOSIS — Z79899 Other long term (current) drug therapy: Secondary | ICD-10-CM | POA: Insufficient documentation

## 2020-10-04 LAB — URINE DRUG SCREEN, QUALITATIVE (ARMC ONLY)
Amphetamines, Ur Screen: NOT DETECTED
Barbiturates, Ur Screen: NOT DETECTED
Benzodiazepine, Ur Scrn: POSITIVE — AB
Cannabinoid 50 Ng, Ur ~~LOC~~: NOT DETECTED
Cocaine Metabolite,Ur ~~LOC~~: NOT DETECTED
MDMA (Ecstasy)Ur Screen: NOT DETECTED
Methadone Scn, Ur: NOT DETECTED
Opiate, Ur Screen: NOT DETECTED
Phencyclidine (PCP) Ur S: NOT DETECTED
Tricyclic, Ur Screen: NOT DETECTED

## 2020-10-04 LAB — COMPREHENSIVE METABOLIC PANEL
ALT: 17 U/L (ref 0–44)
AST: 17 U/L (ref 15–41)
Albumin: 4.1 g/dL (ref 3.5–5.0)
Alkaline Phosphatase: 58 U/L (ref 38–126)
Anion gap: 8 (ref 5–15)
BUN: 15 mg/dL (ref 6–20)
CO2: 27 mmol/L (ref 22–32)
Calcium: 9.2 mg/dL (ref 8.9–10.3)
Chloride: 109 mmol/L (ref 98–111)
Creatinine, Ser: 0.97 mg/dL (ref 0.61–1.24)
GFR, Estimated: >60 mL/min (ref >60)
Glucose, Bld: 106 mg/dL — ABNORMAL HIGH (ref 70–99)
Potassium: 3.7 mmol/L (ref 3.5–5.1)
Sodium: 144 mmol/L (ref 135–145)
Total Bilirubin: 0.4 mg/dL (ref 0.3–1.2)
Total Protein: 7.2 g/dL (ref 6.5–8.1)

## 2020-10-04 LAB — CBC
HCT: 39.4 % (ref 39.0–52.0)
Hemoglobin: 12.5 g/dL — ABNORMAL LOW (ref 13.0–17.0)
MCH: 28.3 pg (ref 26.0–34.0)
MCHC: 31.7 g/dL (ref 30.0–36.0)
MCV: 89.3 fL (ref 80.0–100.0)
Platelets: 255 10*3/uL (ref 150–400)
RBC: 4.41 MIL/uL (ref 4.22–5.81)
RDW: 16.9 % — ABNORMAL HIGH (ref 11.5–15.5)
WBC: 7.7 10*3/uL (ref 4.0–10.5)
nRBC: 0 % (ref 0.0–0.2)

## 2020-10-04 LAB — ACETAMINOPHEN LEVEL: Acetaminophen (Tylenol), Serum: <10 ug/mL — ABNORMAL LOW (ref 10–30)

## 2020-10-04 LAB — ETHANOL: Alcohol, Ethyl (B): 250 mg/dL — ABNORMAL HIGH (ref <10)

## 2020-10-04 LAB — SALICYLATE LEVEL: Salicylate Lvl: <7 mg/dL — ABNORMAL LOW (ref 7.0–30.0)

## 2020-10-04 NOTE — Discharge Instructions (Addendum)
Please seek medical attention and help for any thoughts about wanting to harm yourself, harm others, any concerning change in behavior, severe depression, inappropriate drug use or any other new or concerning symptoms. ° °

## 2020-10-04 NOTE — ED Notes (Signed)
Miguel [name] called, reports will arrive in 5 minutes, pt given DC paper work and clothes to change back into

## 2020-10-04 NOTE — ED Provider Notes (Signed)
[facility] Emergency Department Provider Note  ____________________________________________   I have reviewed the triage vital signs and the nursing notes.   HISTORY  Chief Complaint Laceration and Psychiatric Evaluation   History limited by: Not Limited   HPI Miguel [name] is a 59 y.o. male who presents to the emergency department today because of concern for laceration and alcohol use. The patient is in the emergency department frequently and was seen yesterday. States that he got upset tonight and cut his left arm and abdomen. The patient also admits to drinking alcohol today. The patient denies any recent illness.   Records reviewed. Per medical record review patient has a history of alcohol abuse. HTN.  Past Medical History:  Diagnosis Date  . Alcohol abuse   . Anxiety   . Asthma   . Family history of breast cancer   . GERD (gastroesophageal reflux disease)   . Gout   . Hx of colonic polyps   . Hypertension   . Kidney stone   . 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 OCD (obsessive compulsive disorder)   . Renal colic     Patient Active Problem List   Diagnosis Date Noted  . Genetic testing 07/15/2020  . 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 Right ankle pain 06/26/2020  . Family history of breast cancer   . Hx of colonic polyps   . Hospital discharge follow-up 06/05/2020  . Kidney function test abnormal 06/05/2020  . Abdominal pain 04/12/2020  . Hypertensive urgency 04/12/2020  . Alcohol withdrawal ([place]) 03/04/2020  . Right-sided chest wall pain 02/28/2020  . Alcohol abuse with alcohol-induced mood disorder ([place]) 12/02/2019  . Moderate episode of recurrent major depressive disorder ([place])   . Health care maintenance 10/26/2019  . Right knee pain 10/26/2019  . Generalized anxiety disorder 10/14/2019  . MDD (major depressive disorder), recurrent severe, without psychosis ([place]) 07/27/2019  . Social anxiety  disorder 07/27/2019  . Left-sided chest wall pain 06/14/2019  . Alcoholic cirrhosis of liver without ascites ([place]) 06/14/2019  . Alcohol abuse 06/14/2019  . AKI (acute kidney injury) ([place]) 05/27/2019  . Alcohol withdrawal syndrome without complication ([place]) 31/49/7026  . Alcoholic intoxication without complication ([place])   . Sepsis ([place]) 03/08/2019  . Breast lump or mass 10/04/2018  . Sepsis secondary to UTI ([place]) 09/14/2018  . Asthma 07/07/2018  . GERD (gastroesophageal reflux disease) 07/07/2018  . OCD (obsessive compulsive disorder) 07/07/2018  . Self-inflicted laceration of left wrist ([place]) 03/10/2018  . Leg hematoma 12/25/2017  . Suicide and self-inflicted injury by cutting and piercing instrument ([place]) 03/10/2017  . Severe recurrent major 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 ([place]) 03/09/2017  . Substance induced mood disorder ([place]) 08/15/2016  . Involuntary commitment 08/15/2016  . Alcohol use disorder, severe, dependence ([place]) 02/05/2016  . Hypertension 12/05/2015  . Tachycardia 12/05/2015  . Gout 11/13/2015  . Chronic back pain 05/02/2015    Past Surgical History:  Procedure Laterality Date  . CHOLECYSTECTOMY  2012  . COLONOSCOPY    . COLONOSCOPY WITH PROPOFOL N/A 05/28/2020   Procedure: COLONOSCOPY WITH PROPOFOL;  Surgeon: [name], MD;  Location: [facility] ENDOSCOPY;  Service: Gastroenterology;  Laterality: N/A;  . EXTRACORPOREAL SHOCK WAVE LITHOTRIPSY Left 01/12/2019   Procedure: EXTRACORPOREAL SHOCK WAVE LITHOTRIPSY (ESWL);  Surgeon: Billey Co, MD;  Location: [facility] ORS;  Service: Urology;  Laterality: Left;  . UPPER GI ENDOSCOPY      Prior to Admission medications  Medication Sig Start Date End Date Taking? Authorizing Provider  albuterol (VENTOLIN HFA) 108 (90 Base) MCG/ACT inhaler Inhale 2 puffs into the lungs every 6 (six) hours as needed. 03/21/20   Clapacs, Madie Reno, MD  allopurinol (ZYLOPRIM) 300 MG tablet TAKE ONE TABLET BY MOUTH EVERY DAY 09/18/20    Iloabachie, Chioma E, NP  ascorbic acid (VITAMIN C) 500 MG tablet Take by mouth. 08/01/19   [provider]  citalopram (CELEXA) 20 MG tablet Take 1 tablet (20 mg total) by mouth daily. Patient not taking: Reported on 09/18/2020 03/21/20 03/21/21  Clapacs, Madie Reno, MD  colchicine 0.6 MG tablet Take by mouth. 08/01/19   [provider]  Fluticasone-Salmeterol (ADVAIR DISKUS) 100-50 MCG/DOSE AEPB Inhale 1 puff into the lungs 2 (two) times daily. 03/21/20   Clapacs, Madie Reno, MD  folic acid (FOLVITE) 1 MG tablet Take 1 tablet (1 mg total) by mouth daily. 07/16/20   Iloabachie, Chioma E, NP  lisinopril (ZESTRIL) 40 MG tablet TAKE ONE TABLET BY MOUTH EVERY DAY 09/18/20   Iloabachie, Chioma E, NP  omeprazole (PRILOSEC) 10 MG capsule Take 1 capsule (10 mg total) by mouth daily. 04/21/20   Cuthriell, Charline Bills, PA-C  ondansetron (ZOFRAN ODT) 4 MG disintegrating tablet Allow 1-2 tablets to dissolve in your mouth every 8 hours as needed for nausea/vomiting 08/19/20   Hinda Kehr, MD  thiamine 100 MG tablet Take 1 tablet (100 mg total) by mouth daily. Patient not taking: Reported on 09/18/2020 03/22/20   Clapacs, Madie Reno, MD  traMADol (ULTRAM) 50 MG tablet Take 50 mg by mouth every 4 (four) hours. Patient not taking: Reported on 09/18/2020 08/07/20   [provider]    Allergies Percocet [oxycodone-acetaminophen]  Family History  Problem Relation Age of Onset  . Alcohol abuse Father   . Breast cancer Mother 27    Social History Social History   Tobacco Use  . Smoking status: Former Smoker    Types: Cigarettes  . Smokeless tobacco: Never Used  . Tobacco comment: quit 30 years ago  Vaping Use  . Vaping Use: Never used  Substance Use Topics  . Alcohol use: Yes    Alcohol/week: 12.0 standard drinks    Types: 12 Shots of liquor per week  . Drug use: No    Review of Systems Constitutional: No fever/chills Eyes: No visual changes. ENT: No sore throat. Cardiovascular: Denies chest  pain. Respiratory: Denies shortness of breath. Gastrointestinal: No abdominal pain.  No nausea, no vomiting.  No diarrhea.   Genitourinary: Negative for dysuria. Musculoskeletal: Negative for back pain. Skin: Positive for lacerations to the left forearm and stomach.  Neurological: Negative for headaches, focal weakness or numbness.  ____________________________________________   PHYSICAL EXAM:  VITAL SIGNS: ED Triage Vitals  Enc Vitals Group     BP 10/04/20 1524 124/77     Pulse Rate 10/04/20 1524 89     Resp 10/04/20 1524 16     Temp 10/04/20 1524 98.2 F (36.8 C)     Temp Source 10/04/20 1524 Oral     SpO2 10/04/20 1524 98 %     Weight 10/04/20 1520 185 lb (83.9 kg)     Height 10/04/20 1520 6' (1.829 m)     Head Circumference --      Peak Flow --      Pain Score 10/04/20 1517 0   Constitutional: Alert and oriented.  Eyes: Conjunctivae are normal.  ENT      Head: Normocephalic and atraumatic.  Nose: No congestion/rhinnorhea.      Mouth/Throat: Mucous membranes are moist.      Neck: No stridor. Hematological/Lymphatic/Immunilogical: No cervical lymphadenopathy. Cardiovascular: Normal rate, regular rhythm.  No murmurs, rubs, or gallops.  Respiratory: Normal respiratory effort without tachypnea nor retractions. Breath sounds are clear and equal bilaterally. No wheezes/rales/rhonchi. Gastrointestinal: Soft and non tender. No rebound. No guarding.  Genitourinary: Deferred Musculoskeletal: Normal range of motion in all extremities. No lower extremity edema. Neurologic:  Appears slightly intoxicated. Skin:  Superficial laceration to the left forearm and abdomen.  ____________________________________________    LABS (pertinent positives/negatives)  Acetaminophen, salicylate below threshold Ethanol 250 CMP wnl except glu 106 CBC wbc 7.7, hgb 12.5, plt 255 UDS positive  forbenzo ____________________________________________   EKG  None  ____________________________________________    RADIOLOGY  None  ____________________________________________   PROCEDURES  Procedures  ____________________________________________   INITIAL IMPRESSION / ASSESSMENT AND PLAN / ED COURSE  Pertinent labs & imaging results that were available during my care of the patient were reviewed by me and considered in my medical decision making (see chart for details).   Patient presented to the emergency department today because of concerns for self-inflicted lacerations, depression and alcohol abuse.  Patient is well-known to the emergency department appears more or less at his normal state of health.  He does have superficial larations to his left forearm and abdominal wall.  Patient was seen by Dr. Weber Cooks with psychiatry. At this time will await sobriety and reassess however do think he will likely be able to be discharged at that time.  ____________________________________________   FINAL CLINICAL IMPRESSION(S) / ED DIAGNOSES  Final diagnoses:  Laceration of left forearm, initial encounter  Laceration of abdominal wall, initial encounter  Alcohol abuse     Note: This dictation was prepared with Dragon dictation. Any transcriptional errors that result from this process are unintentional     Nance Pear, MD 10/04/20 2211
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
## 2020-10-04 NOTE — ED Triage Notes (Signed)
First RN Note: Pt to ED via [facility] with c/o self inflicted superficial laceration to L arm with hematoma. Per EMS pt also with self inflicted superficial lacerations to abdomen. Per EMS pt's wife told him to hurt himself so he did.

## 2020-10-04 NOTE — Consult Note (Signed)
[facility] Consult   Reason for Consult: Consult for 59 year old man well-known to our service who has a history of alcohol abuse Referring Physician: Archie Balboa Patient Identification: BOMANI OOMMEN MRN:  [id number] Principal Diagnosis: Self-inflicted laceration of left wrist [facility]) Diagnosis:  Principal Problem:   Self-inflicted laceration of left wrist ([place]) Active Problems:   Alcohol abuse   Alcohol abuse with alcohol-induced mood disorder ([place])   Total Time spent with patient: 1 hour  Subjective:   JAXTIN RAIMONDO is a 59 y.o. male patient admitted with "look what I done".  HPI: Patient seen chart reviewed.  Patient is intoxicated recent blood alcohol level 250.  Quality of interview limited as result.  Patient cut himself up on the left wrist and made multiple superficial cuts on his abdomen as well.  Patient has been drinking today starting with vodka in the morning.  Evidently he and his wife got into some kind argument.  This is a common theme in his presentations.  He claims that somewhere along the line she told him that he ought to just kill himself so he cut himself up on the arm in the abdomen.  Patient is tearful and disorganized from alcohol difficult to follow.  Currently cooperative and calm in the emergency room  Past Psychiatric History: Long history of alcohol abuse depression multiple presentations of exactly this type.  Patient has frequently cut himself usually on the left wrist when intoxicated.  Patient has a tendency to not follow up very well with outpatient treatment  Risk to Self:   Risk to Others:   Prior Inpatient Therapy:   Prior Outpatient Therapy:    Past Medical History:  Past Medical History:  Diagnosis Date  . Alcohol abuse   . Anxiety   . Asthma   . Family history of breast cancer   . GERD (gastroesophageal reflux disease)   . Gout   . Hx of colonic polyps   . Hypertension   . Kidney stone   . 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 OCD (obsessive compulsive disorder)   . Renal colic     Past Surgical History:  Procedure Laterality Date  . CHOLECYSTECTOMY  2012  . COLONOSCOPY    . COLONOSCOPY WITH PROPOFOL N/A 05/28/2020   Procedure: COLONOSCOPY WITH PROPOFOL;  Surgeon: [name], MD;  Location: [facility] ENDOSCOPY;  Service: Gastroenterology;  Laterality: N/A;  . EXTRACORPOREAL SHOCK WAVE LITHOTRIPSY Left 01/12/2019   Procedure: EXTRACORPOREAL SHOCK WAVE LITHOTRIPSY (ESWL);  Surgeon: Billey Co, MD;  Location: [facility] ORS;  Service: Urology;  Laterality: Left;  . UPPER GI ENDOSCOPY     Family History:  Family History  Problem Relation Age of Onset  . Alcohol abuse Father   . Breast cancer Mother 88   Family Psychiatric  History: See previous Social History:  Social History   Substance and Sexual Activity  Alcohol Use Yes  . Alcohol/week: 12.0 standard drinks  . Types: 12 Shots of liquor per week     Social History   Substance and Sexual Activity  Drug Use No    Social History   Socioeconomic History  . Marital status: Married    Spouse name: Not on file  . Number of children: Not on file  . Years of education: Not on file  . Highest education level: Not on file  Occupational History  . Not on file  Tobacco Use  . Smoking status: Former Smoker    Types:  Cigarettes  . Smokeless tobacco: Never Used  . Tobacco comment: quit 30 years ago  Vaping Use  . Vaping Use: Never used  Substance and Sexual Activity  . Alcohol use: Yes    Alcohol/week: 12.0 standard drinks    Types: 12 Shots of liquor per week  . Drug use: No  . Sexual activity: Not on file  Other Topics Concern  . Not on file  Social History Narrative   Lives at home with his wife and takes care of his wife. Independent at baseline      - Biggest strain is financial but doesn't think they could got get more help.      Patient expressed interest in possible financial assistance. Informed written consent obtained    Social Determinants of Health   Financial Resource Strain: High Risk  . Difficulty of Paying Living Expenses: Very hard  Food Insecurity: No Food Insecurity  . Worried About Charity fundraiser in the Last Year: Never true  . Ran Out of Food in the Last Year: Never true  Transportation Needs: No Transportation Needs  . Lack of Transportation (Medical): No  . Lack of Transportation (Non-Medical): No  Physical Activity: Sufficiently Active  . Days of Exercise per Week: 7 days  . Minutes of Exercise per Session: 60 min  Stress: Stress Concern Present  . Feeling of Stress : Very much  Social Connections: Socially Isolated  . Frequency of Communication with Friends and Family: Once a week  . Frequency of Social Gatherings with Friends and Family: Once a week  . Attends Religious Services: Never  . Active Member of Clubs or Organizations: No  . Attends Archivist Meetings: Never  . Marital Status: Married   Additional Social History:    Allergies:   Allergies  Allergen Reactions  . Percocet [Oxycodone-Acetaminophen] Nausea And Vomiting    Labs:  Results for orders placed or performed during the hospital encounter of 10/04/20 (from the past 48 hour(s))  Comprehensive metabolic panel     Status: Abnormal   Collection Time: 10/04/20  3:22 PM  Result Value Ref Range   Sodium 144 135 - 145 mmol/L   Potassium 3.7 3.5 - 5.1 mmol/L   Chloride 109 98 - 111 mmol/L   CO2 27 22 - 32 mmol/L   Glucose, Bld 106 (H) 70 - 99 mg/dL    Comment: Glucose reference range applies only to samples taken after fasting for at least 8 hours.   BUN 15 6 - 20 mg/dL   Creatinine, Ser 0.97 0.61 - 1.24 mg/dL   Calcium 9.2 8.9 - 10.3 mg/dL   Total Protein 7.2 6.5 - 8.1 g/dL   Albumin 4.1 3.5 - 5.0 g/dL   AST 17 15 - 41 U/L   ALT 17 0 - 44 U/L   Alkaline Phosphatase 58 38 - 126 U/L   Total Bilirubin 0.4 0.3 - 1.2 mg/dL   GFR, Estimated >60 >60 mL/min    Comment: (NOTE) Calculated using the  CKD-EPI Creatinine Equation (2021)    Anion gap 8 5 - 15    Comment: Performed at [facility], [address], [place], [place] [address]  Ethanol     Status: Abnormal   Collection Time: 10/04/20  3:22 PM  Result Value Ref Range   Alcohol, Ethyl (B) 250 (H) <10 mg/dL    Comment: (NOTE) Lowest detectable limit for serum alcohol is 10 mg/dL.  For medical purposes only. Performed at [facility],  [place], [place] [address]   Salicylate level     Status: Abnormal   Collection Time: 10/04/20  3:22 PM  Result Value Ref Range   Salicylate Lvl <7.6 (L) 7.0 - 30.0 mg/dL    Comment: Performed at [facility], [address] [place], [place] [address]  Acetaminophen level     Status: Abnormal   Collection Time: 10/04/20  3:22 PM  Result Value Ref Range   Acetaminophen (Tylenol), Serum <10 (L) 10 - 30 ug/mL    Comment: (NOTE) Therapeutic concentrations vary significantly. A range of 10-30 ug/mL  may be an effective concentration for many patients. However, some  are best treated at concentrations outside of this range. Acetaminophen concentrations >150 ug/mL at 4 hours after ingestion  and >50 ug/mL at 12 hours after ingestion are often associated with  toxic reactions.  Performed at [facility], [address] [place], [place] [address]   cbc     Status: Abnormal   Collection Time: 10/04/20  3:22 PM  Result Value Ref Range   WBC 7.7 4.0 - 10.5 K/uL   RBC 4.41 4.22 - 5.81 MIL/uL   Hemoglobin 12.5 (L) 13.0 - 17.0 g/dL   HCT 39.4 39 - 52 %   MCV 89.3 80.0 - 100.0 fL   MCH 28.3 26.0 - 34.0 pg   MCHC 31.7 30.0 - 36.0 g/dL   RDW 16.9 (H) 11.5 - 15.5 %   Platelets 255 150 - 400 K/uL   nRBC 0.0 0.0 - 0.2 %    Comment: Performed at [facility], [address] [place], [place] [address]  Urine Drug Screen, Qualitative     Status: Abnormal   Collection Time: 10/04/20  3:22 PM  Result Value Ref Range   Tricyclic, Ur  Screen NONE DETECTED NONE DETECTED   Amphetamines, Ur Screen NONE DETECTED NONE DETECTED   MDMA (Ecstasy)Ur Screen NONE DETECTED NONE DETECTED   Cocaine Metabolite,Ur [place] NONE DETECTED NONE DETECTED   Opiate, Ur Screen NONE DETECTED NONE DETECTED   Phencyclidine (PCP) Ur S NONE DETECTED NONE DETECTED   Cannabinoid 50 Ng, Ur [place] NONE DETECTED NONE DETECTED   Barbiturates, Ur Screen NONE DETECTED NONE DETECTED   Benzodiazepine, Ur Scrn POSITIVE (A) NONE DETECTED   Methadone Scn, Ur NONE DETECTED NONE DETECTED    Comment: (NOTE) Tricyclics + metabolites, urine    Cutoff 1000 ng/mL Amphetamines + metabolites, urine  Cutoff 1000 ng/mL MDMA (Ecstasy), urine              Cutoff 500 ng/mL Cocaine Metabolite, urine          Cutoff 300 ng/mL Opiate + metabolites, urine        Cutoff 300 ng/mL Phencyclidine (PCP), urine         Cutoff 25 ng/mL Cannabinoid, urine                 Cutoff 50 ng/mL Barbiturates + metabolites, urine  Cutoff 200 ng/mL Benzodiazepine, urine              Cutoff 200 ng/mL Methadone, urine                   Cutoff 300 ng/mL  The urine drug screen provides only a preliminary, unconfirmed analytical test result and should not be used for non-medical purposes. Clinical consideration and professional judgment should be applied to any positive drug screen result due to possible interfering substances. A more specific alternate chemical  method must be used in order to obtain a confirmed analytical result. Gas chromatography / mass spectrometry (GC/MS) is the preferred confirm atory method. Performed at [facility], [address] [place], [place] [address]     No current facility-administered medications for this encounter.   Current Outpatient Medications  Medication Sig Dispense Refill  . albuterol (VENTOLIN HFA) 108 (90 Base) MCG/ACT inhaler Inhale 2 puffs into the lungs every 6 (six) hours as needed. 6.7 g 1  . allopurinol (ZYLOPRIM) 300 MG tablet TAKE ONE  TABLET BY MOUTH EVERY DAY 90 tablet 0  . ascorbic acid (VITAMIN C) 500 MG tablet Take by mouth.    . citalopram (CELEXA) 20 MG tablet Take 1 tablet (20 mg total) by mouth daily. (Patient not taking: Reported on 09/18/2020) 30 tablet 1  . colchicine 0.6 MG tablet Take by mouth.    . Fluticasone-Salmeterol (ADVAIR DISKUS) 100-50 MCG/DOSE AEPB Inhale 1 puff into the lungs 2 (two) times daily. 485 each 0  . folic acid (FOLVITE) 1 MG tablet Take 1 tablet (1 mg total) by mouth daily. 30 tablet 3  . lisinopril (ZESTRIL) 40 MG tablet TAKE ONE TABLET BY MOUTH EVERY DAY 90 tablet 0  . omeprazole (PRILOSEC) 10 MG capsule Take 1 capsule (10 mg total) by mouth daily. 30 capsule 4  . ondansetron (ZOFRAN ODT) 4 MG disintegrating tablet Allow 1-2 tablets to dissolve in your mouth every 8 hours as needed for nausea/vomiting 30 tablet 0  . thiamine 100 MG tablet Take 1 tablet (100 mg total) by mouth daily. (Patient not taking: Reported on 09/18/2020) 30 tablet 1  . traMADol (ULTRAM) 50 MG tablet Take 50 mg by mouth every 4 (four) hours. (Patient not taking: Reported on 09/18/2020)      Musculoskeletal: Strength & Muscle Tone: within normal limits Gait & Station: unsteady Patient leans: N/A  Psychiatric Specialty Exam: Physical Exam Vitals and nursing note reviewed.  Constitutional:      Appearance: He is well-developed.  HENT:     Head: Normocephalic and atraumatic.  Eyes:     Conjunctiva/sclera: Conjunctivae normal.     Pupils: Pupils are equal, round, and reactive to light.  Cardiovascular:     Heart sounds: Normal heart sounds.  Pulmonary:     Effort: Pulmonary effort is normal.  Abdominal:     Palpations: Abdomen is soft.  Musculoskeletal:        General: Normal range of motion.     Cervical back: Normal range of motion.  Skin:    General: Skin is warm and dry.       Neurological:     Mental Status: He is alert.  Psychiatric:        Attention and Perception: He is inattentive.        Mood  and Affect: Mood is depressed.        Speech: Speech is delayed.        Behavior: Behavior is slowed and withdrawn.        Thought Content: Thought content includes suicidal ideation.        Cognition and Memory: Cognition is impaired. Memory is impaired.        Judgment: Judgment is impulsive.     Review of Systems  Constitutional: Negative.   HENT: Negative.   Eyes: Negative.   Respiratory: Negative.   Cardiovascular: Negative.   Gastrointestinal: Negative.   Musculoskeletal: Negative.   Skin: Negative.   Neurological: Negative.   Psychiatric/Behavioral: Positive for behavioral problems,  dysphoric mood and self-injury. The patient is nervous/anxious.     Blood pressure 124/77, pulse 89, temperature 98.2 F (36.8 C), temperature source Oral, resp. rate 16, height 6' (1.829 m), weight 83.9 kg, SpO2 98 %.Body mass index is 25.09 kg/m.  General Appearance: Disheveled  Eye Contact:  Fair  Speech:  Garbled  Volume:  Decreased  Mood:  Depressed  Affect:  Tearful  Thought Process:  Disorganized  Orientation:  Negative  Thought Content:  Rumination and Tangential  Suicidal Thoughts:  Yes.  with intent/plan  Homicidal Thoughts:  No  Memory:  Immediate;   Fair Recent;   Fair Remote;   Fair  Judgement:  Impaired  Insight:  Lacking  Psychomotor Activity:  Decreased  Concentration:  Concentration: Poor  Recall:  Poor  Fund of Knowledge:  Fair  Language:  Fair  Akathisia:  No  Handed:  Right  AIMS (if indicated):     Assets:  Desire for Improvement Housing  ADL's:  Impaired  Cognition:  Impaired,  Mild  Sleep:        Treatment Plan Summary: Plan This is a 59 year old man with a long history of alcohol abuse.  Frequent presentation under identical circumstances intoxicated having cut himself on the wrist.  Patient is currently tearful and sad with suicidal thoughts expressed.  He is still intoxicated and his history is impaired.  Patient has a history of various levels of  detox usually has some subjective tremulousness rarely any more complicated than that.  Lacerations have already been treated.  Recommend observation with CIWA protocol measurements until he is no longer intoxicated.  Patient can be reevaluated but I would anticipate most likely he would be safe to be discharged once he is sober.  Disposition: Supportive therapy provided about ongoing stressors. Discussed crisis plan, support from social network, calling 911, coming to the Emergency Department, and calling Suicide Hotline.  Alethia Berthold, MD 10/04/2020 5:29 PM

## 2020-10-04 NOTE — ED Notes (Signed)
Pt ambulatory to toilet with difficulty and reports steady and "ready to go home", EDP notified, calling taxi for pt

## 2020-10-04 NOTE — ED Notes (Signed)
No peripheral IV placed this visit.    Discharge instructions reviewed with patient. Questions fielded by this RN. Patient verbalizes understanding of instructions. Patient discharged home in stable condition per [facility]man . No acute distress noted at time of discharge.    Pt declined DC VS, leaving in taxi

## 2020-10-04 NOTE — ED Notes (Signed)
dermabond applied to left forearm, dry gauze dressed

## 2020-10-08 NOTE — Telephone Encounter (Signed)
Social work Theatre manager called patient to follow up on him and left message with his wife asking him to call the clinic and provided her the phone number.

## 2020-10-12 ENCOUNTER — Emergency Department
Admission: EM | Admit: 2020-10-12 | Discharge: 2020-10-12 | Disposition: A | Discharge status: Home / Self Care | Payer: Medicaid Other | Attending: Emergency Medicine | Admitting: Emergency Medicine

## 2020-10-12 ENCOUNTER — Other Ambulatory Visit: Payer: Self-pay

## 2020-10-12 ENCOUNTER — Encounter: Payer: Self-pay | Admitting: Emergency Medicine

## 2020-10-12 DIAGNOSIS — F10920 Alcohol use, unspecified with intoxication, uncomplicated: Secondary | ICD-10-CM

## 2020-10-12 DIAGNOSIS — Z87891 Personal history of nicotine dependence: Secondary | ICD-10-CM | POA: Insufficient documentation

## 2020-10-12 DIAGNOSIS — I10 Essential (primary) hypertension: Secondary | ICD-10-CM | POA: Insufficient documentation

## 2020-10-12 DIAGNOSIS — Z79899 Other long term (current) drug therapy: Secondary | ICD-10-CM | POA: Insufficient documentation

## 2020-10-12 DIAGNOSIS — F10129 Alcohol abuse with intoxication, unspecified: Secondary | ICD-10-CM | POA: Insufficient documentation

## 2020-10-12 DIAGNOSIS — J45909 Unspecified asthma, uncomplicated: Secondary | ICD-10-CM | POA: Insufficient documentation

## 2020-10-12 DIAGNOSIS — R110 Nausea: Secondary | ICD-10-CM | POA: Insufficient documentation

## 2020-10-12 LAB — COMPREHENSIVE METABOLIC PANEL
ALT: 17 U/L (ref 0–44)
AST: 22 U/L (ref 15–41)
Albumin: 4.2 g/dL (ref 3.5–5.0)
Alkaline Phosphatase: 70 U/L (ref 38–126)
Anion gap: 15 (ref 5–15)
BUN: 19 mg/dL (ref 8–23)
CO2: 23 mmol/L (ref 22–32)
Calcium: 8.5 mg/dL — ABNORMAL LOW (ref 8.9–10.3)
Chloride: 109 mmol/L (ref 98–111)
Creatinine, Ser: 1.22 mg/dL (ref 0.61–1.24)
GFR, Estimated: >60 mL/min (ref >60)
Glucose, Bld: 94 mg/dL (ref 70–99)
Potassium: 4 mmol/L (ref 3.5–5.1)
Sodium: 147 mmol/L — ABNORMAL HIGH (ref 135–145)
Total Bilirubin: 0.4 mg/dL (ref 0.3–1.2)
Total Protein: 7.4 g/dL (ref 6.5–8.1)

## 2020-10-12 LAB — CBC
HCT: 42.8 % (ref 39.0–52.0)
Hemoglobin: 13.7 g/dL (ref 13.0–17.0)
MCH: 27.8 pg (ref 26.0–34.0)
MCHC: 32 g/dL (ref 30.0–36.0)
MCV: 87 fL (ref 80.0–100.0)
Platelets: 259 10*3/uL (ref 150–400)
RBC: 4.92 MIL/uL (ref 4.22–5.81)
RDW: 17.1 % — ABNORMAL HIGH (ref 11.5–15.5)
WBC: 6.8 10*3/uL (ref 4.0–10.5)
nRBC: 0 % (ref 0.0–0.2)

## 2020-10-12 LAB — ETHANOL: Alcohol, Ethyl (B): 334 mg/dL (ref <10)

## 2020-10-12 LAB — SALICYLATE LEVEL: Salicylate Lvl: <7 mg/dL — ABNORMAL LOW (ref 7.0–30.0)

## 2020-10-12 LAB — ACETAMINOPHEN LEVEL: Acetaminophen (Tylenol), Serum: <10 ug/mL — ABNORMAL LOW (ref 10–30)

## 2020-10-12 NOTE — ED Notes (Signed)
Date and time results received: 10/12/20 4:43 PM  (use smartphrase ".now" to insert current time)  Test: ETOH Critical Value: 334  Name of Provider Notified: Archie Balboa  Orders Received? Or Actions Taken?: Critical Results Acknowledged

## 2020-10-12 NOTE — ED Provider Notes (Signed)
[facility] Emergency Department Provider Note  ____________________________________________   I have reviewed the triage vital signs and the nursing notes.   HISTORY  Chief Complaint Feel bad  History limited by: Not Limited   HPI Miguel RIEMENSCHNEIDER is a 59 y.o. male who presents to the emergency department today because he states he feels bad. He says that he drank extra today because yesterday was his birthday. He has a hard time describing how he feels bad, although he does state he has some slight nausea. Does state that he fell earlier today and scraped the inside of his right forearm. Denies hitting his head. He is asking for a place to sit down and relax for a couple of hours.   Records reviewed. Per medical record review patient has a history of alcohol abuse, frequent ER visits for alcohol abuse.   Past Medical History:  Diagnosis Date  . Alcohol abuse   . Anxiety   . Asthma   . Family history of breast cancer   . GERD (gastroesophageal reflux disease)   . Gout   . Hx of colonic polyps   . Hypertension   . Kidney stone   . 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 OCD (obsessive compulsive disorder)   . Renal colic     Patient Active Problem List   Diagnosis Date Noted  . Genetic testing 07/15/2020  . 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 Right ankle pain 06/26/2020  . Family history of breast cancer   . Hx of colonic polyps   . Hospital discharge follow-up 06/05/2020  . Kidney function test abnormal 06/05/2020  . Abdominal pain 04/12/2020  . Hypertensive urgency 04/12/2020  . Alcohol withdrawal ([place]) 03/04/2020  . Right-sided chest wall pain 02/28/2020  . Alcohol abuse with alcohol-induced mood disorder ([place]) 12/02/2019  . Moderate episode of recurrent major depressive disorder ([place])   . Health care maintenance 10/26/2019  . Right knee pain 10/26/2019  . Generalized anxiety disorder 10/14/2019  . MDD (major  depressive disorder), recurrent severe, without psychosis ([place]) 07/27/2019  . Social anxiety disorder 07/27/2019  . Left-sided chest wall pain 06/14/2019  . Alcoholic cirrhosis of liver without ascites ([place]) 06/14/2019  . Alcohol abuse 06/14/2019  . AKI (acute kidney injury) ([place]) 05/27/2019  . Alcohol withdrawal syndrome without complication ([place]) 74/14/2395  . Alcoholic intoxication without complication ([place])   . Sepsis ([place]) 03/08/2019  . Breast lump or mass 10/04/2018  . Sepsis secondary to UTI ([place]) 09/14/2018  . Asthma 07/07/2018  . GERD (gastroesophageal reflux disease) 07/07/2018  . OCD (obsessive compulsive disorder) 07/07/2018  . Self-inflicted laceration of left wrist ([place]) 03/10/2018  . Leg hematoma 12/25/2017  . Suicide and self-inflicted injury by cutting and piercing instrument ([place]) 03/10/2017  . Severe recurrent major 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 ([place]) 03/09/2017  . Substance induced mood disorder ([place]) 08/15/2016  . Involuntary commitment 08/15/2016  . Alcohol use disorder, severe, dependence ([place]) 02/05/2016  . Hypertension 12/05/2015  . Tachycardia 12/05/2015  . Gout 11/13/2015  . Chronic back pain 05/02/2015    Past Surgical History:  Procedure Laterality Date  . CHOLECYSTECTOMY  2012  . COLONOSCOPY    . COLONOSCOPY WITH PROPOFOL N/A 05/28/2020   Procedure: COLONOSCOPY WITH PROPOFOL;  Surgeon: [name], MD;  Location: [facility] ENDOSCOPY;  Service: Gastroenterology;  Laterality: N/A;  . EXTRACORPOREAL SHOCK WAVE LITHOTRIPSY Left 01/12/2019   Procedure: EXTRACORPOREAL SHOCK WAVE LITHOTRIPSY (ESWL);  Surgeon: Diamantina Providence,  Herbert Seta, MD;  Location: [facility] ORS;  Service: Urology;  Laterality: Left;  . UPPER GI ENDOSCOPY      Prior to Admission medications   Medication Sig Start Date End Date Taking? Authorizing Provider  albuterol (VENTOLIN HFA) 108 (90 Base) MCG/ACT inhaler Inhale 2 puffs into the lungs every 6 (six) hours as needed. 03/21/20   Clapacs, Madie Reno, MD   allopurinol (ZYLOPRIM) 300 MG tablet TAKE ONE TABLET BY MOUTH EVERY DAY 09/18/20   Iloabachie, Chioma E, NP  ascorbic acid (VITAMIN C) 500 MG tablet Take by mouth. 08/01/19   [provider]  citalopram (CELEXA) 20 MG tablet Take 1 tablet (20 mg total) by mouth daily. Patient not taking: Reported on 09/18/2020 03/21/20 03/21/21  Clapacs, Madie Reno, MD  colchicine 0.6 MG tablet Take by mouth. 08/01/19   [provider]  Fluticasone-Salmeterol (ADVAIR DISKUS) 100-50 MCG/DOSE AEPB Inhale 1 puff into the lungs 2 (two) times daily. 03/21/20   Clapacs, Madie Reno, MD  folic acid (FOLVITE) 1 MG tablet Take 1 tablet (1 mg total) by mouth daily. 07/16/20   Iloabachie, Chioma E, NP  lisinopril (ZESTRIL) 40 MG tablet TAKE ONE TABLET BY MOUTH EVERY DAY 09/18/20   Iloabachie, Chioma E, NP  omeprazole (PRILOSEC) 10 MG capsule Take 1 capsule (10 mg total) by mouth daily. 04/21/20   Cuthriell, Charline Bills, PA-C  ondansetron (ZOFRAN ODT) 4 MG disintegrating tablet Allow 1-2 tablets to dissolve in your mouth every 8 hours as needed for nausea/vomiting 08/19/20   Hinda Kehr, MD  thiamine 100 MG tablet Take 1 tablet (100 mg total) by mouth daily. Patient not taking: Reported on 09/18/2020 03/22/20   Clapacs, Madie Reno, MD  traMADol (ULTRAM) 50 MG tablet Take 50 mg by mouth every 4 (four) hours. Patient not taking: Reported on 09/18/2020 08/07/20   [provider]    Allergies Percocet [oxycodone-acetaminophen]  Family History  Problem Relation Age of Onset  . Alcohol abuse Father   . Breast cancer Mother 21    Social History Social History   Tobacco Use  . Smoking status: Former Smoker    Types: Cigarettes  . Smokeless tobacco: Never Used  . Tobacco comment: quit 30 years ago  Vaping Use  . Vaping Use: Never used  Substance Use Topics  . Alcohol use: Yes    Alcohol/week: 12.0 standard drinks    Types: 12 Shots of liquor per week  . Drug use: No    Review of Systems Constitutional: No  fever/chills Eyes: No visual changes. ENT: No sore throat. Cardiovascular: Denies chest pain. Respiratory: Denies shortness of breath. Gastrointestinal: No abdominal pain.  Positive for nausea.  Genitourinary: Negative for dysuria. Musculoskeletal: Negative for back pain. Skin: Positive for abrasion to his right inner forearm.  Neurological: Negative for headaches, focal weakness or numbness.  ____________________________________________   PHYSICAL EXAM:  VITAL SIGNS: ED Triage Vitals  Enc Vitals Group     BP 10/12/20 1614 (!) 155/94     Pulse Rate 10/12/20 1614 (!) 104     Resp 10/12/20 1614 20     Temp 10/12/20 1614 98.7 F (37.1 C)     Temp Source 10/12/20 1614 Oral     SpO2 10/12/20 1614 96 %     Weight 10/12/20 1615 185 lb (83.9 kg)     Height 10/12/20 1615 6' (1.829 m)   Constitutional: Alert and oriented.  Eyes: Conjunctivae are normal.  ENT      Head: Normocephalic and atraumatic.  Nose: No congestion/rhinnorhea.      Mouth/Throat: Mucous membranes are moist.      Neck: No stridor. Hematological/Lymphatic/Immunilogical: No cervical lymphadenopathy. Cardiovascular: Normal rate, regular rhythm.  No murmurs, rubs, or gallops.  Respiratory: Normal respiratory effort without tachypnea nor retractions. Breath sounds are clear and equal bilaterally. No wheezes/rales/rhonchi. Gastrointestinal: Soft and non tender. No rebound. No guarding.  Genitourinary: Deferred Musculoskeletal: Normal range of motion in all extremities. No lower extremity edema. Neurologic:  Normal speech and language. No gross focal neurologic deficits are appreciated.  Skin:  Skin is warm, dry and intact. No rash noted. Psychiatric: Mood and affect are normal. Speech and behavior are normal. Patient exhibits appropriate insight and judgment.  ____________________________________________    LABS (pertinent positives/negatives)  Salicylate, acetaminophen below threshold Ethanol 334 CBC wbc  6.8, hgb 13.7, plt 259 CMP wnl except na 147, ca 8.5  ____________________________________________   EKG  None  ____________________________________________    RADIOLOGY  None  ____________________________________________   PROCEDURES  Procedures  ____________________________________________   INITIAL IMPRESSION / ASSESSMENT AND PLAN / ED COURSE  Pertinent labs & imaging results that were available during my care of the patient were reviewed by me and considered in my medical decision making (see chart for details).   Patient here because he states he feels bad. He was asking for a place to sit and relax for a couple of hours. Patient is well known to myself and appears to be at his baseline. Blood work shows elevated ethanol. Discussed with patient importance of alcohol cessation. He did express concern for the care of his wife, tried to encourage patient to stop alcohol use to help him care for his wife. Given that patient is at his baseline will plan on discharging home.    ____________________________________________   FINAL CLINICAL IMPRESSION(S) / ED DIAGNOSES  Final diagnoses:  Alcoholic intoxication without complication [facility])     Note: This dictation was prepared with Dragon dictation. Any transcriptional errors that result from this process are unintentional     Nance Pear, MD 10/12/20 1754

## 2020-10-12 NOTE — Discharge Instructions (Signed)
Please seek medical attention for any high fevers, chest pain, shortness of breath, change in behavior, persistent vomiting, bloody stool or any other new or concerning symptoms.  

## 2020-10-12 NOTE — ED Notes (Signed)
Pt states that he is here because he was drinking today and he fell down. Pt has abrasion on his right arm. Pt states that he did hit his head but no injury noted. Pt states that he is also trembling all over. Pt not noted to be in any distress

## 2020-10-12 NOTE — ED Triage Notes (Signed)
Pt arrived via [facility], pt arrived intoxicated from alcohol. Pt states he drank a bottle of wine.  Pt states today is his birthday, reminded him that his birthday was yesterday. Slurring words.

## 2020-10-14 ENCOUNTER — Other Ambulatory Visit: Payer: Self-pay | Admitting: Gerontology

## 2020-10-15 ENCOUNTER — Other Ambulatory Visit: Payer: Self-pay | Admitting: Gerontology

## 2020-10-18 ENCOUNTER — Emergency Department
Admission: EM | Admit: 2020-10-18 | Discharge: 2020-10-19 | Disposition: A | Discharge status: Home / Self Care | Payer: Self-pay | Attending: Student in an Organized Health Care Education/Training Program | Admitting: Student in an Organized Health Care Education/Training Program

## 2020-10-18 ENCOUNTER — Encounter: Payer: Self-pay | Admitting: Emergency Medicine

## 2020-10-18 ENCOUNTER — Other Ambulatory Visit: Payer: Self-pay

## 2020-10-18 ENCOUNTER — Emergency Department: Payer: Self-pay

## 2020-10-18 DIAGNOSIS — F1010 Alcohol abuse, uncomplicated: Secondary | ICD-10-CM | POA: Insufficient documentation

## 2020-10-18 DIAGNOSIS — E860 Dehydration: Secondary | ICD-10-CM | POA: Insufficient documentation

## 2020-10-18 DIAGNOSIS — Z7952 Long term (current) use of systemic steroids: Secondary | ICD-10-CM | POA: Insufficient documentation

## 2020-10-18 DIAGNOSIS — J45909 Unspecified asthma, uncomplicated: Secondary | ICD-10-CM | POA: Insufficient documentation

## 2020-10-18 DIAGNOSIS — Z79899 Other long term (current) drug therapy: Secondary | ICD-10-CM | POA: Insufficient documentation

## 2020-10-18 DIAGNOSIS — I10 Essential (primary) hypertension: Secondary | ICD-10-CM | POA: Insufficient documentation

## 2020-10-18 DIAGNOSIS — Z87891 Personal history of nicotine dependence: Secondary | ICD-10-CM | POA: Insufficient documentation

## 2020-10-18 LAB — CBC
HCT: 35.4 % — ABNORMAL LOW (ref 39.0–52.0)
Hemoglobin: 11.3 g/dL — ABNORMAL LOW (ref 13.0–17.0)
MCH: 27.8 pg (ref 26.0–34.0)
MCHC: 31.9 g/dL (ref 30.0–36.0)
MCV: 87.2 fL (ref 80.0–100.0)
Platelets: 197 10*3/uL (ref 150–400)
RBC: 4.06 MIL/uL — ABNORMAL LOW (ref 4.22–5.81)
RDW: 17 % — ABNORMAL HIGH (ref 11.5–15.5)
WBC: 8.4 10*3/uL (ref 4.0–10.5)
nRBC: 0 % (ref 0.0–0.2)

## 2020-10-18 LAB — HEPATIC FUNCTION PANEL
ALT: 13 U/L (ref 0–44)
AST: 10 U/L — ABNORMAL LOW (ref 15–41)
Albumin: 3.1 g/dL — ABNORMAL LOW (ref 3.5–5.0)
Alkaline Phosphatase: 47 U/L (ref 38–126)
Bilirubin, Direct: <0.1 mg/dL (ref 0.0–0.2)
Total Bilirubin: 0.6 mg/dL (ref 0.3–1.2)
Total Protein: 5.4 g/dL — ABNORMAL LOW (ref 6.5–8.1)

## 2020-10-18 LAB — URINE DRUG SCREEN, QUALITATIVE (ARMC ONLY)
Amphetamines, Ur Screen: NOT DETECTED
Barbiturates, Ur Screen: NOT DETECTED
Benzodiazepine, Ur Scrn: POSITIVE — AB
Cannabinoid 50 Ng, Ur ~~LOC~~: POSITIVE — AB
Cocaine Metabolite,Ur ~~LOC~~: NOT DETECTED
MDMA (Ecstasy)Ur Screen: NOT DETECTED
Methadone Scn, Ur: NOT DETECTED
Opiate, Ur Screen: NOT DETECTED
Phencyclidine (PCP) Ur S: NOT DETECTED
Tricyclic, Ur Screen: NOT DETECTED

## 2020-10-18 LAB — URINALYSIS, COMPLETE (UACMP) WITH MICROSCOPIC
Bacteria, UA: NONE SEEN
Bilirubin Urine: NEGATIVE
Glucose, UA: NEGATIVE mg/dL
Hgb urine dipstick: NEGATIVE
Ketones, ur: NEGATIVE mg/dL
Leukocytes,Ua: NEGATIVE
Nitrite: NEGATIVE
Protein, ur: NEGATIVE mg/dL
Specific Gravity, Urine: 1.002 — ABNORMAL LOW (ref 1.005–1.030)
pH: 7 (ref 5.0–8.0)

## 2020-10-18 LAB — BASIC METABOLIC PANEL
Anion gap: 9 (ref 5–15)
BUN: 19 mg/dL (ref 8–23)
CO2: 19 mmol/L — ABNORMAL LOW (ref 22–32)
Calcium: 9.3 mg/dL (ref 8.9–10.3)
Chloride: 112 mmol/L — ABNORMAL HIGH (ref 98–111)
Creatinine, Ser: 1.47 mg/dL — ABNORMAL HIGH (ref 0.61–1.24)
GFR, Estimated: 54 mL/min — ABNORMAL LOW (ref >60)
Glucose, Bld: 110 mg/dL — ABNORMAL HIGH (ref 70–99)
Potassium: 3.6 mmol/L (ref 3.5–5.1)
Sodium: 140 mmol/L (ref 135–145)

## 2020-10-18 LAB — ETHANOL: Alcohol, Ethyl (B): 225 mg/dL — ABNORMAL HIGH (ref <10)

## 2020-10-18 LAB — ACETAMINOPHEN LEVEL: Acetaminophen (Tylenol), Serum: <10 ug/mL — ABNORMAL LOW (ref 10–30)

## 2020-10-18 LAB — TROPONIN I (HIGH SENSITIVITY)
Troponin I (High Sensitivity): 3 ng/L (ref <18)
Troponin I (High Sensitivity): 3 ng/L (ref <18)

## 2020-10-18 MED ORDER — SODIUM CHLORIDE 0.9 % IV BOLUS
1000.0000 mL | Freq: Once | INTRAVENOUS | Status: AC
  Administered 2020-10-18: 1000 mL via INTRAVENOUS

## 2020-10-18 MED ORDER — IPRATROPIUM-ALBUTEROL 0.5-2.5 (3) MG/3ML IN SOLN
3.0000 mL | Freq: Once | RESPIRATORY_TRACT | Status: AC
  Administered 2020-10-18: 3 mL via RESPIRATORY_TRACT
  Filled 2020-10-18: qty 3

## 2020-10-18 NOTE — ED Triage Notes (Signed)
First Nurse Note:  Arrives via [facility].  C/O not feeling well today.  VS wnl.

## 2020-10-18 NOTE — ED Notes (Signed)
Pt resting comfortably at this time. BP improving at this time with IVF. NAD noted.

## 2020-10-18 NOTE — ED Notes (Signed)
Pt going to CT at this time.

## 2020-10-18 NOTE — ED Provider Notes (Signed)
[facility] Emergency Department Provider Note    First MD Initiated Contact with Patient 10/18/20 1740     (approximate)  I have reviewed the triage vital signs and the nursing notes.   HISTORY  Chief Complaint Near Syncope  Level V caveat:  AMS -suspect intoxication  HPI Miguel [name] is a 59 y.o. male the below listed past medical history presents to the ER via EMS.  Patient does endorse drinking alcohol today feeling very weak.  Blood pressure was low in triage but was reportedly normal with EMS.  Denies any fevers.  No chest pain or shortness of breath.  States he only had a few shots of vodka today.   Past Medical History:  Diagnosis Date  . Alcohol abuse   . Anxiety   . Asthma   . Family history of breast cancer   . GERD (gastroesophageal reflux disease)   . Gout   . Hx of colonic polyps   . Hypertension   . Kidney stone   . 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 OCD (obsessive compulsive disorder)   . Renal colic    Family History  Problem Relation Age of Onset  . Alcohol abuse Father   . Breast cancer Mother 65   Past Surgical History:  Procedure Laterality Date  . CHOLECYSTECTOMY  2012  . COLONOSCOPY    . COLONOSCOPY WITH PROPOFOL N/A 05/28/2020   Procedure: COLONOSCOPY WITH PROPOFOL;  Surgeon: [name], MD;  Location: [facility] ENDOSCOPY;  Service: Gastroenterology;  Laterality: N/A;  . EXTRACORPOREAL SHOCK WAVE LITHOTRIPSY Left 01/12/2019   Procedure: EXTRACORPOREAL SHOCK WAVE LITHOTRIPSY (ESWL);  Surgeon: Billey Co, MD;  Location: [facility] ORS;  Service: Urology;  Laterality: Left;  . UPPER GI ENDOSCOPY     Patient Active Problem List   Diagnosis Date Noted  . Genetic testing 07/15/2020  . 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 Right ankle pain 06/26/2020  . Family history of breast cancer   . Hx of colonic polyps   . Hospital discharge follow-up 06/05/2020  . Kidney function test abnormal  06/05/2020  . Abdominal pain 04/12/2020  . Hypertensive urgency 04/12/2020  . Alcohol withdrawal ([place]) 03/04/2020  . Right-sided chest wall pain 02/28/2020  . Alcohol abuse with alcohol-induced mood disorder ([place]) 12/02/2019  . Moderate episode of recurrent major depressive disorder ([place])   . Health care maintenance 10/26/2019  . Right knee pain 10/26/2019  . Generalized anxiety disorder 10/14/2019  . MDD (major depressive disorder), recurrent severe, without psychosis ([place]) 07/27/2019  . Social anxiety disorder 07/27/2019  . Left-sided chest wall pain 06/14/2019  . Alcoholic cirrhosis of liver without ascites ([place]) 06/14/2019  . Alcohol abuse 06/14/2019  . AKI (acute kidney injury) ([place]) 05/27/2019  . Alcohol withdrawal syndrome without complication ([place]) 15/72/6203  . Alcoholic intoxication without complication ([place])   . Sepsis ([place]) 03/08/2019  . Breast lump or mass 10/04/2018  . Sepsis secondary to UTI ([place]) 09/14/2018  . Asthma 07/07/2018  . GERD (gastroesophageal reflux disease) 07/07/2018  . OCD (obsessive compulsive disorder) 07/07/2018  . Self-inflicted laceration of left wrist ([place]) 03/10/2018  . Leg hematoma 12/25/2017  . Suicide and self-inflicted injury by cutting and piercing instrument ([place]) 03/10/2017  . Severe recurrent major 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 ([place]) 03/09/2017  . Substance induced mood disorder ([place]) 08/15/2016  . Involuntary commitment 08/15/2016  . Alcohol use disorder, severe, dependence ([place]) 02/05/2016  . Hypertension 12/05/2015  .  Tachycardia 12/05/2015  . Gout 11/13/2015  . Chronic back pain 05/02/2015      Prior to Admission medications   Medication Sig Start Date End Date Taking? Authorizing Provider  albuterol (VENTOLIN HFA) 108 (90 Base) MCG/ACT inhaler Inhale 2 puffs into the lungs every 6 (six) hours as needed. 03/21/20   Clapacs, Madie Reno, MD  allopurinol (ZYLOPRIM) 300 MG tablet TAKE ONE TABLET BY MOUTH EVERY DAY 09/18/20    Iloabachie, Chioma E, NP  ascorbic acid (VITAMIN C) 500 MG tablet Take by mouth. 08/01/19   [provider]  citalopram (CELEXA) 20 MG tablet Take 1 tablet (20 mg total) by mouth daily. Patient not taking: Reported on 09/18/2020 03/21/20 03/21/21  Clapacs, Madie Reno, MD  colchicine 0.6 MG tablet Take by mouth. 08/01/19   [provider]  Fluticasone-Salmeterol (ADVAIR DISKUS) 100-50 MCG/DOSE AEPB Inhale 1 puff into the lungs 2 (two) times daily. 03/21/20   Clapacs, Madie Reno, MD  folic acid (FOLVITE) 1 MG tablet Take 1 tablet (1 mg total) by mouth daily. 07/16/20   Iloabachie, Chioma E, NP  lisinopril (ZESTRIL) 40 MG tablet TAKE ONE TABLET BY MOUTH EVERY DAY 09/18/20   Iloabachie, Chioma E, NP  ondansetron (ZOFRAN ODT) 4 MG disintegrating tablet Allow 1-2 tablets to dissolve in your mouth every 8 hours as needed for nausea/vomiting 08/19/20   Hinda Kehr, MD  pantoprazole (PROTONIX) 40 MG tablet Take 1 tablet (40 mg total) by mouth daily. 10/15/20   Iloabachie, Chioma E, NP  thiamine 100 MG tablet Take 1 tablet (100 mg total) by mouth daily. Patient not taking: Reported on 09/18/2020 03/22/20   Clapacs, Madie Reno, MD  traMADol (ULTRAM) 50 MG tablet Take 50 mg by mouth every 4 (four) hours. Patient not taking: Reported on 09/18/2020 08/07/20   [provider]    Allergies Percocet [oxycodone-acetaminophen]    Social History Social History   Tobacco Use  . Smoking status: Former Smoker    Types: Cigarettes  . Smokeless tobacco: Never Used  . Tobacco comment: quit 30 years ago  Vaping Use  . Vaping Use: Never used  Substance Use Topics  . Alcohol use: Yes    Alcohol/week: 12.0 standard drinks    Types: 12 Shots of liquor per week  . Drug use: No    Review of Systems Patient denies headaches, rhinorrhea, blurry vision, numbness, shortness of breath, chest pain, edema, cough, abdominal pain, nausea, vomiting, diarrhea, dysuria, fevers, rashes or hallucinations unless otherwise  stated above in HPI. ____________________________________________   PHYSICAL EXAM:  VITAL SIGNS: Vitals:   10/18/20 2015 10/18/20 2030  BP: (!) 113/92 121/85  Pulse: 76 83  Resp: (!) 22 16  Temp:    SpO2: 100% 94%    Constitutional: drowsy, intoxicated appearing, smells heavily of alcohol Eyes: Conjunctivae are normal.  Head: Atraumatic. Nose: No congestion/rhinnorhea. Mouth/Throat: Mucous membranes are moist.   Neck: No stridor. Painless ROM.  Cardiovascular: Normal rate, regular rhythm. Grossly normal heart sounds.  Good peripheral circulation. Respiratory: Normal respiratory effort.  No retractions. Lungs CTAB. Gastrointestinal: Soft and nontender. No distention. No abdominal bruits. No CVA tenderness. Genitourinary:  Musculoskeletal: No lower extremity tenderness nor edema.  No joint effusions. Neurologic:  No gross focal neurologic deficits are appreciated. No facial droop Skin:  Skin is warm, dry and intact. No rash noted. Psychiatric: Mood and affect are normal. Speech and behavior are normal.  ____________________________________________   LABS (all labs ordered are listed, but only abnormal results are displayed)  Results for orders placed or performed during the hospital encounter of 10/18/20 (from the past 24 hour(s))  Basic metabolic panel     Status: Abnormal   Collection Time: 10/18/20  5:21 PM  Result Value Ref Range   Sodium 140 135 - 145 mmol/L   Potassium 3.6 3.5 - 5.1 mmol/L   Chloride 112 (H) 98 - 111 mmol/L   CO2 19 (L) 22 - 32 mmol/L   Glucose, Bld 110 (H) 70 - 99 mg/dL   BUN 19 8 - 23 mg/dL   Creatinine, Ser 1.47 (H) 0.61 - 1.24 mg/dL   Calcium 9.3 8.9 - 10.3 mg/dL   GFR, Estimated 54 (L) >60 mL/min   Anion gap 9 5 - 15  CBC     Status: Abnormal   Collection Time: 10/18/20  5:21 PM  Result Value Ref Range   WBC 8.4 4.0 - 10.5 K/uL   RBC 4.06 (L) 4.22 - 5.81 MIL/uL   Hemoglobin 11.3 (L) 13.0 - 17.0 g/dL   HCT 35.4 (L) 39 - 52 %   MCV 87.2  80.0 - 100.0 fL   MCH 27.8 26.0 - 34.0 pg   MCHC 31.9 30.0 - 36.0 g/dL   RDW 17.0 (H) 11.5 - 15.5 %   Platelets 197 150 - 400 K/uL   nRBC 0.0 0.0 - 0.2 %  Urinalysis, Complete w Microscopic     Status: Abnormal   Collection Time: 10/18/20  5:55 PM  Result Value Ref Range   Color, Urine STRAW (A) YELLOW   APPearance CLEAR (A) CLEAR   Specific Gravity, Urine 1.002 (L) 1.005 - 1.030   pH 7.0 5.0 - 8.0   Glucose, UA NEGATIVE NEGATIVE mg/dL   Hgb urine dipstick NEGATIVE NEGATIVE   Bilirubin Urine NEGATIVE NEGATIVE   Ketones, ur NEGATIVE NEGATIVE mg/dL   Protein, ur NEGATIVE NEGATIVE mg/dL   Nitrite NEGATIVE NEGATIVE   Leukocytes,Ua NEGATIVE NEGATIVE   RBC / HPF 0-5 0 - 5 RBC/hpf   WBC, UA 0-5 0 - 5 WBC/hpf   Bacteria, UA NONE SEEN NONE SEEN   Squamous Epithelial / LPF 0-5 0 - 5  Ethanol     Status: Abnormal   Collection Time: 10/18/20  5:56 PM  Result Value Ref Range   Alcohol, Ethyl (B) 225 (H) <10 mg/dL  Acetaminophen level     Status: Abnormal   Collection Time: 10/18/20  5:56 PM  Result Value Ref Range   Acetaminophen (Tylenol), Serum <10 (L) 10 - 30 ug/mL  Hepatic function panel     Status: Abnormal   Collection Time: 10/18/20  5:56 PM  Result Value Ref Range   Total Protein 5.4 (L) 6.5 - 8.1 g/dL   Albumin 3.1 (L) 3.5 - 5.0 g/dL   AST 10 (L) 15 - 41 U/L   ALT 13 0 - 44 U/L   Alkaline Phosphatase 47 38 - 126 U/L   Total Bilirubin 0.6 0.3 - 1.2 mg/dL   Bilirubin, Direct <0.1 0.0 - 0.2 mg/dL   Indirect Bilirubin NOT CALCULATED 0.3 - 0.9 mg/dL  Troponin I (High Sensitivity)     Status: None   Collection Time: 10/18/20  5:56 PM  Result Value Ref Range   Troponin I (High Sensitivity) 3 <18 ng/L  Urine Drug Screen, Qualitative ([facility] only)     Status: Abnormal   Collection Time: 10/18/20  8:10 PM  Result Value Ref Range   Tricyclic, Ur Screen NONE DETECTED NONE DETECTED   Amphetamines, Ur Screen NONE  DETECTED NONE DETECTED   MDMA (Ecstasy)Ur Screen NONE DETECTED NONE  DETECTED   Cocaine Metabolite,Ur [place] NONE DETECTED NONE DETECTED   Opiate, Ur Screen NONE DETECTED NONE DETECTED   Phencyclidine (PCP) Ur S NONE DETECTED NONE DETECTED   Cannabinoid 50 Ng, Ur [place] POSITIVE (A) NONE DETECTED   Barbiturates, Ur Screen NONE DETECTED NONE DETECTED   Benzodiazepine, Ur Scrn POSITIVE (A) NONE DETECTED   Methadone Scn, Ur NONE DETECTED NONE DETECTED  Troponin I (High Sensitivity)     Status: None   Collection Time: 10/18/20  8:10 PM  Result Value Ref Range   Troponin I (High Sensitivity) 3 <18 ng/L   ____________________________________________  EKG My review and personal interpretation at Time: 17:19   Indication: etoh abuse  Rate: 90  Rhythm: sinus Axis: left Other: normal intervals, no stemi ____________________________________________  RADIOLOGY  I personally reviewed all radiographic images ordered to evaluate for the above acute complaints and reviewed radiology reports and findings.  These findings were personally discussed with the patient.  Please see medical record for radiology report.  ____________________________________________   PROCEDURES  Procedure(s) performed:  Procedures    Critical Care performed: no ____________________________________________   INITIAL IMPRESSION / ASSESSMENT AND PLAN / ED COURSE  Pertinent labs & imaging results that were available during my care of the patient were reviewed by me and considered in my medical decision making (see chart for details).   DDX: Intoxication, dehydration, electrolyte abnormality  AMRO WINEBARGER is a 60 y.o. who presents to the ED with presentation as described above.  Patient linitis facility with known alcohol abuse.  Smells heavily of alcohol.  He was hypotensive but given IV fluids with improvement of blood pressure.  States he did have a fall while intoxicated therefore CT head and neck ordered which fortunately shows no evidence of acute intracranial abnormality.  Is exam is  otherwise reassuring.  EKG is nonischemic  Clinical Course as of Oct 20 11  Fri Oct 18, 2020  2011 Blood pressure significantly improved.  I suspect hesitation secondary to intoxication and dehydration.  Does not have any white count.  No significant acidosis.  Anion gap is negative.  Creatinine is bumped from previous further supporting dehydration.  Receiving IV fluids.  Will be observed until clinically sober at which point I think he is currently appropriate for discharge home   [PR]  2036 Patient feeling improved.  Is well perfused blood pressure now normalized.  States he is feeling a little bit short of breath does have some scattered wheeze.  Does typically take albuterol at home.  We will give him breathing treatment lower continue to observe.   [PR]  Sat Oct 19, 2020  0010 Patient signed out to oncoming physician.  Once able to steadily ambulate will be appropriate for discharge home.   [PR]    Clinical Course User Index [PR] Merlyn Lot, MD    The patient was evaluated in Emergency Department today for the symptoms described in the history of present illness. He/she was evaluated in the context of the global COVID-19 pandemic, which necessitated consideration that the patient might be at risk for infection with the SARS-CoV-2 virus that causes COVID-19. Institutional protocols and algorithms that pertain to the evaluation of patients at risk for COVID-19 are in a state of rapid change based on information released by regulatory bodies including the CDC and federal and state organizations. These policies and algorithms were followed during the patient's care in the ED.  As  part of my medical decision making, I reviewed the following data within the [facility] notes reviewed and incorporated, Labs reviewed, notes from prior ED visits and [place] Controlled Substance Database   ____________________________________________   FINAL CLINICAL IMPRESSION(S) / ED  DIAGNOSES  Final diagnoses:  ETOH abuse  Dehydration      NEW MEDICATIONS STARTED DURING THIS VISIT:  New Prescriptions   No medications on file     Note:  This document was prepared using Dragon voice recognition software and may include unintentional dictation errors.    Merlyn Lot, MD 10/19/20 [phone]

## 2020-10-18 NOTE — ED Notes (Addendum)
Pt presents to ED via EMS with c/o of "not feeling well". Per EMS vitals were stable but during triage pt was found to be hypotensive after triage RN took BP multiple times. Pt presents to to RM 9 with a systolic of 78. Pt had IV access and 1L of NS infusing at this time. Pt is a daily drinker and states he did take 2-4 shots of vodka today around 1400. Pt states he doesn't remember much other than "almost passing out". PT is currently A&Ox4. Pt still hypotensive. Pt denies N/V/D, pt denies fevers or chills. Pt states he fell this morning at "some point" and is c/o of upper back and neck pain at this time.

## 2020-10-18 NOTE — ED Notes (Signed)
X RAY at bedside 

## 2020-10-18 NOTE — ED Notes (Signed)
[facility]y alarm not in room. Pt given soup. CO neck pain, sob and cough.

## 2020-10-18 NOTE — ED Triage Notes (Signed)
Pt comes into the ED via [facility] from home c/o near syncopal episode.  Pt states he fell but never had LOC and didn't hit his head.  Pt is neurologically intact at this time but overall states he feels weak. Pt has even and unlabored respirations. Pt admits to ETOH on board with having "taken a couple shot of vodka".

## 2020-10-19 ENCOUNTER — Encounter: Payer: Self-pay | Admitting: Emergency Medicine

## 2020-10-19 ENCOUNTER — Other Ambulatory Visit: Payer: Self-pay

## 2020-10-19 ENCOUNTER — Emergency Department
Admission: EM | Admit: 2020-10-19 | Discharge: 2020-10-19 | Disposition: A | Discharge status: Home / Self Care | Payer: Medicaid Other | Attending: Emergency Medicine | Admitting: Emergency Medicine

## 2020-10-19 ENCOUNTER — Emergency Department: Payer: Medicaid Other

## 2020-10-19 DIAGNOSIS — R519 Headache, unspecified: Secondary | ICD-10-CM | POA: Insufficient documentation

## 2020-10-19 DIAGNOSIS — W01198A Fall on same level from slipping, tripping and stumbling with subsequent striking against other object, initial encounter: Secondary | ICD-10-CM | POA: Insufficient documentation

## 2020-10-19 DIAGNOSIS — F1099 Alcohol use, unspecified with unspecified alcohol-induced disorder: Secondary | ICD-10-CM | POA: Insufficient documentation

## 2020-10-19 DIAGNOSIS — Z5321 Procedure and treatment not carried out due to patient leaving prior to being seen by health care provider: Secondary | ICD-10-CM | POA: Insufficient documentation

## 2020-10-19 DIAGNOSIS — M542 Cervicalgia: Secondary | ICD-10-CM | POA: Insufficient documentation

## 2020-10-19 MED ORDER — LORAZEPAM 2 MG/ML IJ SOLN
0.0000 mg | Freq: Four times a day (QID) | INTRAMUSCULAR | Status: DC
  Administered 2020-10-19: 2 mg via INTRAVENOUS
  Filled 2020-10-19: qty 1

## 2020-10-19 MED ORDER — THIAMINE HCL 100 MG PO TABS: 100.0000 mg | ORAL_TABLET | Freq: Every day | ORAL | Status: DC

## 2020-10-19 MED ORDER — LORAZEPAM 2 MG/ML IJ SOLN: 0.0000 mg | Freq: Four times a day (QID) | INTRAMUSCULAR | Status: DC

## 2020-10-19 NOTE — ED Triage Notes (Signed)
Ptto ED via [facility] with c/o fall. Per EMS +ETOH use. Pt endorses falling at home PTA. Pt states he fell again today. Pt states he hit his head and neck again today but hit the R side of his head. Pt states slipped on leaves. Pt denies ETOH use today.

## 2020-10-19 NOTE — ED Notes (Signed)
Patient provided with 2 warm blankets, sandwich snack pack, and ginger ale per request.

## 2020-10-19 NOTE — ED Provider Notes (Deleted)
-----------------------------------------   2:48 AM on 10/19/2020 -----------------------------------------  Patient had been sleeping and under ED observation until sobriety.  Now jittery and restless.  Will place on CIWA scale for alcohol withdrawal.   ----------------------------------------- 5:49 AM on 10/19/2020 -----------------------------------------  Patient awake, alert, ambulatory with steady gait.  Will take a cab home.  Strict return precautions given.  Patient verbalizes understanding agrees with plan of care.   Paulette Blanch, MD 10/19/20 [phone]

## 2020-10-19 NOTE — ED Provider Notes (Signed)
-----------------------------------------   2:48 AM on 10/19/2020 -----------------------------------------  Patient had been sleeping and under ED observation until sobriety.  Now jittery and restless.  Will place on CIWA scale for alcohol withdrawal.   ----------------------------------------- 5:49 AM on 10/19/2020 -----------------------------------------  Patient awake, alert, ambulatory with steady gait.  Will take a cab home.  Strict return precautions given.  Patient verbalizes understanding agrees with plan of care.   Paulette Blanch, MD 10/19/20 [phone]

## 2020-10-19 NOTE — Discharge Instructions (Addendum)
Drink alcohol only in moderation.  Drink plenty of nonalcoholic fluids this weekend.  Return to the ER for worsening symptoms, persistent vomiting, difficulty breathing or other concerns.

## 2020-10-21 ENCOUNTER — Other Ambulatory Visit: Payer: Self-pay | Admitting: Internal Medicine

## 2020-10-22 ENCOUNTER — Other Ambulatory Visit: Payer: Self-pay

## 2020-10-22 ENCOUNTER — Other Ambulatory Visit: Payer: Self-pay | Admitting: Gerontology

## 2020-10-22 ENCOUNTER — Ambulatory Visit: Payer: Medicaid Other | Admitting: Gerontology

## 2020-10-22 VITALS — BP 150/90 | HR 97 | Temp 97.3°F | Resp 16 | Wt 194.4 lb

## 2020-10-22 DIAGNOSIS — I10 Essential (primary) hypertension: Secondary | ICD-10-CM

## 2020-10-22 DIAGNOSIS — F1010 Alcohol abuse, uncomplicated: Secondary | ICD-10-CM

## 2020-10-22 MED ORDER — THIAMINE HCL 100 MG PO TABS: 100.0000 mg | ORAL_TABLET | Freq: Every day | ORAL | 0 refills | Status: DC

## 2020-10-22 NOTE — Progress Notes (Signed)
Established Patient Office Visit  Subjective:  Patient ID: Miguel [name], male    DOB: 1961/06/02  Age: 59 y.o. MRN: [id number]  CC: No chief complaint on file.   HPI [name] presents for follow up after multiple ED . He was at the ED on 10/19/2020 for dehydration and ETOH abuse. Currently, he states that he's doing well and states that he last had an alcoholic beverage 4 days ago, denies withdrawal symptoms. He states that he has not scheduled an appointment with [facility], but denies suicidal nor homicidal ideation. He reports that he's compliant with his medications and continues to work on making healthy lifestyle changes. His blood pressure was elevated and he reports that he's being busy and was rushing to meet up with his appointment. He states that he has not been checking his blood pressure for more than one month. He denies chest pain, palpitation, shortness of breath, vision changes and light headedness. Overall, he states that he's doing well and offers no further complaint.  Past Medical History:  Diagnosis Date  . Alcohol abuse   . Anxiety   . Asthma   . Family history of breast cancer   . GERD (gastroesophageal reflux disease)   . Gout   . Hx of colonic polyps   . Hypertension   . Kidney stone   . 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 OCD (obsessive compulsive disorder)   . Renal colic     Past Surgical History:  Procedure Laterality Date  . CHOLECYSTECTOMY  2012  . COLONOSCOPY    . COLONOSCOPY WITH PROPOFOL N/A 05/28/2020   Procedure: COLONOSCOPY WITH PROPOFOL;  Surgeon: [name], MD;  Location: [facility] ENDOSCOPY;  Service: Gastroenterology;  Laterality: N/A;  . EXTRACORPOREAL SHOCK WAVE LITHOTRIPSY Left 01/12/2019   Procedure: EXTRACORPOREAL SHOCK WAVE LITHOTRIPSY (ESWL);  Surgeon: Billey Co, MD;  Location: [facility] ORS;  Service: Urology;  Laterality: Left;  . UPPER GI ENDOSCOPY      Family History  Problem Relation Age of Onset  . Alcohol  abuse Father   . Breast cancer Mother 53    Social History   Socioeconomic History  . Marital status: Married    Spouse name: Not on file  . Number of children: Not on file  . Years of education: Not on file  . Highest education level: Not on file  Occupational History  . Not on file  Tobacco Use  . Smoking status: Former Smoker    Types: Cigarettes  . Smokeless tobacco: Never Used  . Tobacco comment: quit 30 years ago  Vaping Use  . Vaping Use: Never used  Substance and Sexual Activity  . Alcohol use: Yes    Alcohol/week: 12.0 standard drinks    Types: 12 Shots of liquor per week  . Drug use: No  . Sexual activity: Not on file  Other Topics Concern  . Not on file  Social History Narrative   Lives at home with his wife and takes care of his wife. Independent at baseline      - Biggest strain is financial but doesn't think they could got get more help.      Patient expressed interest in possible financial assistance. Informed written consent obtained   Social Determinants of Health   Financial Resource Strain: High Risk  . Difficulty of Paying Living Expenses: Very hard  Food Insecurity: No Food Insecurity  . Worried About Charity fundraiser in the Last Year:  Never true  . Ran Out of Food in the Last Year: Never true  Transportation Needs: No Transportation Needs  . Lack of Transportation (Medical): No  . Lack of Transportation (Non-Medical): No  Physical Activity: Sufficiently Active  . Days of Exercise per Week: 7 days  . Minutes of Exercise per Session: 60 min  Stress: Stress Concern Present  . Feeling of Stress : Very much  Social Connections: Socially Isolated  . Frequency of Communication with Friends and Family: Once a week  . Frequency of Social Gatherings with Friends and Family: Once a week  . Attends Religious Services: Never  . Active Member of Clubs or Organizations: No  . Attends Archivist Meetings: Never  . Marital Status: Married   Human resources officer Violence: Not At Risk  . Fear of Current or Ex-Partner: No  . Emotionally Abused: No  . Physically Abused: No  . Sexually Abused: No    Outpatient Medications Prior to Visit  Medication Sig Dispense Refill  . allopurinol (ZYLOPRIM) 300 MG tablet TAKE ONE TABLET BY MOUTH EVERY DAY 90 tablet 0  . ascorbic acid (VITAMIN C) 500 MG tablet Take by mouth.    . citalopram (CELEXA) 20 MG tablet Take 1 tablet (20 mg total) by mouth daily. 30 tablet 1  . Fluticasone-Salmeterol (ADVAIR DISKUS) 100-50 MCG/DOSE AEPB Inhale 1 puff into the lungs 2 (two) times daily. 185 each 0  . folic acid (FOLVITE) 1 MG tablet Take 1 tablet (1 mg total) by mouth daily. 30 tablet 3  . lisinopril (ZESTRIL) 40 MG tablet TAKE ONE TABLET BY MOUTH EVERY DAY 90 tablet 0  . pantoprazole (PROTONIX) 40 MG tablet Take 1 tablet (40 mg total) by mouth daily. 90 tablet 0  . VENTOLIN HFA 108 (90 Base) MCG/ACT inhaler INHALE 2 PUFFS EVERY 4 HOURS AS NEEDED 72 g 0  . colchicine 0.6 MG tablet Take by mouth. (Patient not taking: Reported on 10/22/2020)    . ondansetron (ZOFRAN ODT) 4 MG disintegrating tablet Allow 1-2 tablets to dissolve in your mouth every 8 hours as needed for nausea/vomiting (Patient not taking: Reported on 10/22/2020) 30 tablet 0  . thiamine 100 MG tablet Take 1 tablet (100 mg total) by mouth daily. (Patient not taking: Reported on 09/18/2020) 30 tablet 1  . traMADol (ULTRAM) 50 MG tablet Take 50 mg by mouth every 4 (four) hours. (Patient not taking: Reported on 09/18/2020)     No facility-administered medications prior to visit.    Allergies  Allergen Reactions  . Percocet [Oxycodone-Acetaminophen] Nausea And Vomiting    ROS Review of Systems  Constitutional: Negative.   Eyes: Negative.   Respiratory: Negative.   Cardiovascular: Negative.   Neurological: Negative.   Psychiatric/Behavioral: Negative.       Objective:    Physical Exam HENT:     Head: Normocephalic and atraumatic.   Eyes:     Extraocular Movements: Extraocular movements intact.     Conjunctiva/sclera: Conjunctivae normal.     Pupils: Pupils are equal, round, and reactive to light.  Cardiovascular:     Rate and Rhythm: Normal rate and regular rhythm.     Pulses: Normal pulses.     Heart sounds: Normal heart sounds.  Pulmonary:     Effort: Pulmonary effort is normal.     Breath sounds: Normal breath sounds.  Skin:    General: Skin is warm.  Neurological:     General: No focal deficit present.     Mental Status: He is  alert and oriented to person, place, and time. Mental status is at baseline.  Psychiatric:        Mood and Affect: Mood normal.        Behavior: Behavior normal.        Thought Content: Thought content normal.        Judgment: Judgment normal.     BP (!) 150/90 (BP Location: Right Arm, Patient Position: Sitting, Cuff Size: Large)   Pulse 97   Temp (!) 97.3 F (36.3 C)   Resp 16   Wt 194 lb 6.4 oz (88.2 kg)   SpO2 98%   BMI 26.37 kg/m  Wt Readings from Last 3 Encounters:  10/22/20 194 lb 6.4 oz (88.2 kg)  10/18/20 185 lb (83.9 kg)  10/12/20 185 lb (83.9 kg)     Health Maintenance Due  Topic Date Due  . Hepatitis C Screening  Never done  . COVID-19 Vaccine (1) Never done    There are no preventive care reminders to display for this patient.  Lab Results  Component Value Date   TSH 1.831 03/18/2020   Lab Results  Component Value Date   WBC 8.4 10/18/2020   HGB 11.3 (L) 10/18/2020   HCT 35.4 (L) 10/18/2020   MCV 87.2 10/18/2020   PLT 197 10/18/2020   Lab Results  Component Value Date   NA 140 10/18/2020   K 3.6 10/18/2020   CO2 19 (L) 10/18/2020   GLUCOSE 110 (H) 10/18/2020   BUN 19 10/18/2020   CREATININE 1.47 (H) 10/18/2020   BILITOT 0.6 10/18/2020   ALKPHOS 47 10/18/2020   AST 10 (L) 10/18/2020   ALT 13 10/18/2020   PROT 5.4 (L) 10/18/2020   ALBUMIN 3.1 (L) 10/18/2020   CALCIUM 9.3 10/18/2020   ANIONGAP 9 10/18/2020   Lab Results   Component Value Date   CHOL 155 05/22/2020   Lab Results  Component Value Date   HDL 65 05/22/2020   Lab Results  Component Value Date   LDLCALC 73 05/22/2020   Lab Results  Component Value Date   TRIG 92 05/22/2020   Lab Results  Component Value Date   CHOLHDL 2.4 05/22/2020   Lab Results  Component Value Date   HGBA1C 5.3 03/18/2020      Assessment & Plan:     1. Primary hypertension - His blood pressure was elevated, his goal should be less than 140/90. He will continue on current medication and DASH diet. He was advised to check blood pressure daily, record and bring log to follow up appointment.  2. Alcohol abuse - He was strongly encouraged on alcohol abstinence, and to schedule an appointment with [facility]. He  Was advised to call the Crisis help line with worsening symptoms. - thiamine 100 MG tablet; Take 1 tablet (100 mg total) by mouth daily.  Dispense: 90 tablet; Refill: 0   Follow-up: Return in about 5 weeks (around 11/27/2020), or if symptoms worsen or fail to improve.    Briahna Pescador Jerold Coombe, NP

## 2020-10-22 NOTE — Patient Instructions (Signed)
DASH Eating Plan DASH stands for "Dietary Approaches to Stop Hypertension." The DASH eating plan is a healthy eating plan that has been shown to reduce high blood pressure (hypertension). It may also reduce your risk for type 2 diabetes, heart disease, and stroke. The DASH eating plan may also help with weight loss. What are tips for following this plan?  General guidelines  Avoid eating more than 2,300 mg (milligrams) of salt (sodium) a day. If you have hypertension, you may need to reduce your sodium intake to 1,500 mg a day.  Limit alcohol intake to no more than 1 drink a day for nonpregnant women and 2 drinks a day for men. One drink equals 12 oz of beer, 5 oz of wine, or 1 oz of hard liquor.  Work with your health care provider to maintain a healthy body weight or to lose weight. Ask what an ideal weight is for you.  Get at least 30 minutes of exercise that causes your heart to beat faster (aerobic exercise) most days of the week. Activities may include walking, swimming, or biking.  Work with your health care provider or diet and nutrition specialist (dietitian) to adjust your eating plan to your individual calorie needs. Reading food labels   Check food labels for the amount of sodium per serving. Choose foods with less than 5 percent of the Daily Value of sodium. Generally, foods with less than 300 mg of sodium per serving fit into this eating plan.  To find whole grains, look for the word "whole" as the first word in the ingredient list. Shopping  Buy products labeled as "low-sodium" or "no salt added."  Buy fresh foods. Avoid canned foods and premade or frozen meals. Cooking  Avoid adding salt when cooking. Use salt-free seasonings or herbs instead of table salt or sea salt. Check with your health care provider or pharmacist before using salt substitutes.  Do not fry foods. Cook foods using healthy methods such as baking, boiling, grilling, and broiling instead.  Cook with  heart-healthy oils, such as olive, canola, soybean, or sunflower oil. Meal planning  Eat a balanced diet that includes: ? 5 or more servings of fruits and vegetables each day. At each meal, try to fill half of your plate with fruits and vegetables. ? Up to 6-8 servings of whole grains each day. ? Less than 6 oz of lean meat, poultry, or fish each day. A 3-oz serving of meat is about the same size as a deck of cards. One egg equals 1 oz. ? 2 servings of low-fat dairy each day. ? A serving of nuts, seeds, or beans 5 times each week. ? Heart-healthy fats. Healthy fats called Omega-3 fatty acids are found in foods such as flaxseeds and coldwater fish, like sardines, salmon, and mackerel.  Limit how much you eat of the following: ? Canned or prepackaged foods. ? Food that is high in trans fat, such as fried foods. ? Food that is high in saturated fat, such as fatty meat. ? Sweets, desserts, sugary drinks, and other foods with added sugar. ? Full-fat dairy products.  Do not salt foods before eating.  Try to eat at least 2 vegetarian meals each week.  Eat more home-cooked food and less restaurant, buffet, and fast food.  When eating at a restaurant, ask that your food be prepared with less salt or no salt, if possible. What foods are recommended? The items listed may not be a complete list. Talk with your dietitian about   what dietary choices are best for you. Grains Whole-grain or whole-wheat bread. Whole-grain or whole-wheat pasta. Brown rice. Oatmeal. Quinoa. Bulgur. Whole-grain and low-sodium cereals. Pita bread. Low-fat, low-sodium crackers. Whole-wheat flour tortillas. Vegetables Fresh or frozen vegetables (raw, steamed, roasted, or grilled). Low-sodium or reduced-sodium tomato and vegetable juice. Low-sodium or reduced-sodium tomato sauce and tomato paste. Low-sodium or reduced-sodium canned vegetables. Fruits All fresh, dried, or frozen fruit. Canned fruit in natural juice (without  added sugar). Meat and other protein foods Skinless chicken or turkey. Ground chicken or turkey. Pork with fat trimmed off. Fish and seafood. Egg whites. Dried beans, peas, or lentils. Unsalted nuts, nut butters, and seeds. Unsalted canned beans. Lean cuts of beef with fat trimmed off. Low-sodium, lean deli meat. Dairy Low-fat (1%) or fat-free (skim) milk. Fat-free, low-fat, or reduced-fat cheeses. Nonfat, low-sodium ricotta or cottage cheese. Low-fat or nonfat yogurt. Low-fat, low-sodium cheese. Fats and oils Soft margarine without trans fats. Vegetable oil. Low-fat, reduced-fat, or light mayonnaise and salad dressings (reduced-sodium). Canola, safflower, olive, soybean, and sunflower oils. Avocado. Seasoning and other foods Herbs. Spices. Seasoning mixes without salt. Unsalted popcorn and pretzels. Fat-free sweets. What foods are not recommended? The items listed may not be a complete list. Talk with your dietitian about what dietary choices are best for you. Grains Baked goods made with fat, such as croissants, muffins, or some breads. Dry pasta or rice meal packs. Vegetables Creamed or fried vegetables. Vegetables in a cheese sauce. Regular canned vegetables (not low-sodium or reduced-sodium). Regular canned tomato sauce and paste (not low-sodium or reduced-sodium). Regular tomato and vegetable juice (not low-sodium or reduced-sodium). Pickles. Olives. Fruits Canned fruit in a light or heavy syrup. Fried fruit. Fruit in cream or butter sauce. Meat and other protein foods Fatty cuts of meat. Ribs. Fried meat. Bacon. Sausage. Bologna and other processed lunch meats. Salami. Fatback. Hotdogs. Bratwurst. Salted nuts and seeds. Canned beans with added salt. Canned or smoked fish. Whole eggs or egg yolks. Chicken or turkey with skin. Dairy Whole or 2% milk, cream, and half-and-half. Whole or full-fat cream cheese. Whole-fat or sweetened yogurt. Full-fat cheese. Nondairy creamers. Whipped toppings.  Processed cheese and cheese spreads. Fats and oils Butter. Stick margarine. Lard. Shortening. Ghee. Bacon fat. Tropical oils, such as coconut, palm kernel, or palm oil. Seasoning and other foods Salted popcorn and pretzels. Onion salt, garlic salt, seasoned salt, table salt, and sea salt. Worcestershire sauce. Tartar sauce. Barbecue sauce. Teriyaki sauce. Soy sauce, including reduced-sodium. Steak sauce. Canned and packaged gravies. Fish sauce. Oyster sauce. Cocktail sauce. Horseradish that you find on the shelf. Ketchup. Mustard. Meat flavorings and tenderizers. Bouillon cubes. Hot sauce and Tabasco sauce. Premade or packaged marinades. Premade or packaged taco seasonings. Relishes. Regular salad dressings. Where to find more information:  National Heart, Lung, and Blood Institute: www.nhlbi.nih.gov  American Heart Association: www.heart.org Summary  The DASH eating plan is a healthy eating plan that has been shown to reduce high blood pressure (hypertension). It may also reduce your risk for type 2 diabetes, heart disease, and stroke.  With the DASH eating plan, you should limit salt (sodium) intake to 2,300 mg a day. If you have hypertension, you may need to reduce your sodium intake to 1,500 mg a day.  When on the DASH eating plan, aim to eat more fresh fruits and vegetables, whole grains, lean proteins, low-fat dairy, and heart-healthy fats.  Work with your health care provider or diet and nutrition specialist (dietitian) to adjust your eating plan to your   individual calorie needs. This information is not intended to replace advice given to you by your health care provider. Make sure you discuss any questions you have with your health care provider. Document Revised: 10/15/2017 Document Reviewed: 10/26/2016 Elsevier Patient Education  2020 Elsevier Inc.  

## 2020-10-23 ENCOUNTER — Other Ambulatory Visit: Payer: Self-pay

## 2020-10-23 ENCOUNTER — Telehealth: Payer: Self-pay | Admitting: Pharmacist

## 2020-10-23 ENCOUNTER — Encounter: Payer: Self-pay | Admitting: Emergency Medicine

## 2020-10-23 ENCOUNTER — Emergency Department: Payer: Medicaid Other

## 2020-10-23 ENCOUNTER — Emergency Department
Admission: EM | Admit: 2020-10-23 | Discharge: 2020-10-23 | Disposition: A | Discharge status: Home / Self Care | Payer: Medicaid Other | Attending: Emergency Medicine | Admitting: Emergency Medicine

## 2020-10-23 DIAGNOSIS — Z5321 Procedure and treatment not carried out due to patient leaving prior to being seen by health care provider: Secondary | ICD-10-CM | POA: Insufficient documentation

## 2020-10-23 DIAGNOSIS — R519 Headache, unspecified: Secondary | ICD-10-CM | POA: Insufficient documentation

## 2020-10-23 DIAGNOSIS — R109 Unspecified abdominal pain: Secondary | ICD-10-CM | POA: Insufficient documentation

## 2020-10-23 DIAGNOSIS — Y92009 Unspecified place in unspecified non-institutional (private) residence as the place of occurrence of the external cause: Secondary | ICD-10-CM | POA: Insufficient documentation

## 2020-10-23 DIAGNOSIS — W19XXXA Unspecified fall, initial encounter: Secondary | ICD-10-CM | POA: Insufficient documentation

## 2020-10-23 DIAGNOSIS — M542 Cervicalgia: Secondary | ICD-10-CM | POA: Insufficient documentation

## 2020-10-23 LAB — COMPREHENSIVE METABOLIC PANEL
ALT: 13 U/L (ref 0–44)
AST: 15 U/L (ref 15–41)
Albumin: 3.9 g/dL (ref 3.5–5.0)
Alkaline Phosphatase: 51 U/L (ref 38–126)
Anion gap: 13 (ref 5–15)
BUN: 19 mg/dL (ref 8–23)
CO2: 22 mmol/L (ref 22–32)
Calcium: 8.4 mg/dL — ABNORMAL LOW (ref 8.9–10.3)
Chloride: 110 mmol/L (ref 98–111)
Creatinine, Ser: 0.96 mg/dL (ref 0.61–1.24)
GFR, Estimated: >60 mL/min (ref >60)
Glucose, Bld: 88 mg/dL (ref 70–99)
Potassium: 3.4 mmol/L — ABNORMAL LOW (ref 3.5–5.1)
Sodium: 145 mmol/L (ref 135–145)
Total Bilirubin: 0.7 mg/dL (ref 0.3–1.2)
Total Protein: 6.7 g/dL (ref 6.5–8.1)

## 2020-10-23 LAB — CBC
HCT: 36.4 % — ABNORMAL LOW (ref 39.0–52.0)
Hemoglobin: 11.8 g/dL — ABNORMAL LOW (ref 13.0–17.0)
MCH: 28 pg (ref 26.0–34.0)
MCHC: 32.4 g/dL (ref 30.0–36.0)
MCV: 86.3 fL (ref 80.0–100.0)
Platelets: 199 10*3/uL (ref 150–400)
RBC: 4.22 MIL/uL (ref 4.22–5.81)
RDW: 17.7 % — ABNORMAL HIGH (ref 11.5–15.5)
WBC: 6.9 10*3/uL (ref 4.0–10.5)
nRBC: 0 % (ref 0.0–0.2)

## 2020-10-23 LAB — MAGNESIUM: Magnesium: 2 mg/dL (ref 1.7–2.4)

## 2020-10-23 LAB — ETHANOL: Alcohol, Ethyl (B): 295 mg/dL — ABNORMAL HIGH (ref <10)

## 2020-10-23 LAB — LIPASE, BLOOD: Lipase: 28 U/L (ref 11–51)

## 2020-10-23 NOTE — Telephone Encounter (Signed)
10/23/2020 12:18:14 PM - Refilled online w/[facility] Ventolin & Advair  -- Miguel [name] - Wednesday, October 23, 2020 12:17 PM --Refilled online with [facility] - Ventolin & Advair, Order# [id number].

## 2020-10-23 NOTE — ED Triage Notes (Signed)
First RN Note: pt to ED via [facility] from home with c/o fall. Per EMS +ETOH use today.

## 2020-10-23 NOTE — ED Triage Notes (Signed)
Pt c/o abdominal pain also c/o headache and neck pain post fall. Pt A&O at baseline at this time.

## 2020-10-27 ENCOUNTER — Encounter: Payer: Self-pay | Admitting: Emergency Medicine

## 2020-10-27 ENCOUNTER — Other Ambulatory Visit: Payer: Self-pay

## 2020-10-27 ENCOUNTER — Emergency Department
Admission: EM | Admit: 2020-10-27 | Discharge: 2020-10-27 | Disposition: A | Discharge status: Home / Self Care | Payer: Medicaid Other | Attending: Emergency Medicine | Admitting: Emergency Medicine

## 2020-10-27 DIAGNOSIS — Y92009 Unspecified place in unspecified non-institutional (private) residence as the place of occurrence of the external cause: Secondary | ICD-10-CM | POA: Insufficient documentation

## 2020-10-27 DIAGNOSIS — F10920 Alcohol use, unspecified with intoxication, uncomplicated: Secondary | ICD-10-CM

## 2020-10-27 DIAGNOSIS — W19XXXA Unspecified fall, initial encounter: Secondary | ICD-10-CM | POA: Insufficient documentation

## 2020-10-27 DIAGNOSIS — S80812A Abrasion, left lower leg, initial encounter: Secondary | ICD-10-CM | POA: Insufficient documentation

## 2020-10-27 DIAGNOSIS — Z87891 Personal history of nicotine dependence: Secondary | ICD-10-CM | POA: Insufficient documentation

## 2020-10-27 DIAGNOSIS — F10129 Alcohol abuse with intoxication, unspecified: Secondary | ICD-10-CM | POA: Insufficient documentation

## 2020-10-27 DIAGNOSIS — I10 Essential (primary) hypertension: Secondary | ICD-10-CM | POA: Insufficient documentation

## 2020-10-27 DIAGNOSIS — Z79899 Other long term (current) drug therapy: Secondary | ICD-10-CM | POA: Insufficient documentation

## 2020-10-27 DIAGNOSIS — J45909 Unspecified asthma, uncomplicated: Secondary | ICD-10-CM | POA: Insufficient documentation

## 2020-10-27 LAB — CBC WITH DIFFERENTIAL/PLATELET
Abs Immature Granulocytes: 0.02 10*3/uL (ref 0.00–0.07)
Basophils Absolute: 0.1 10*3/uL (ref 0.0–0.1)
Basophils Relative: 1 %
Eosinophils Absolute: 0.2 10*3/uL (ref 0.0–0.5)
Eosinophils Relative: 2 %
HCT: 42.3 % (ref 39.0–52.0)
Hemoglobin: 13.3 g/dL (ref 13.0–17.0)
Immature Granulocytes: 0 %
Lymphocytes Relative: 30 %
Lymphs Abs: 2.1 10*3/uL (ref 0.7–4.0)
MCH: 27.5 pg (ref 26.0–34.0)
MCHC: 31.4 g/dL (ref 30.0–36.0)
MCV: 87.4 fL (ref 80.0–100.0)
Monocytes Absolute: 0.5 10*3/uL (ref 0.1–1.0)
Monocytes Relative: 8 %
Neutro Abs: 4 10*3/uL (ref 1.7–7.7)
Neutrophils Relative %: 59 %
Platelets: 254 10*3/uL (ref 150–400)
RBC: 4.84 MIL/uL (ref 4.22–5.81)
RDW: 18.5 % — ABNORMAL HIGH (ref 11.5–15.5)
WBC: 6.8 10*3/uL (ref 4.0–10.5)
nRBC: 0 % (ref 0.0–0.2)

## 2020-10-27 LAB — COMPREHENSIVE METABOLIC PANEL
ALT: 22 U/L (ref 0–44)
AST: 24 U/L (ref 15–41)
Albumin: 4.5 g/dL (ref 3.5–5.0)
Alkaline Phosphatase: 70 U/L (ref 38–126)
Anion gap: 13 (ref 5–15)
BUN: 14 mg/dL (ref 8–23)
CO2: 24 mmol/L (ref 22–32)
Calcium: 8.8 mg/dL — ABNORMAL LOW (ref 8.9–10.3)
Chloride: 108 mmol/L (ref 98–111)
Creatinine, Ser: 1.18 mg/dL (ref 0.61–1.24)
GFR, Estimated: >60 mL/min (ref >60)
Glucose, Bld: 101 mg/dL — ABNORMAL HIGH (ref 70–99)
Potassium: 4.1 mmol/L (ref 3.5–5.1)
Sodium: 145 mmol/L (ref 135–145)
Total Bilirubin: 0.6 mg/dL (ref 0.3–1.2)
Total Protein: 7.5 g/dL (ref 6.5–8.1)

## 2020-10-27 LAB — ETHANOL: Alcohol, Ethyl (B): 402 mg/dL (ref <10)

## 2020-10-27 MED ORDER — LORAZEPAM 1 MG PO TABS
1.0000 mg | ORAL_TABLET | Freq: Once | ORAL | Status: AC
  Administered 2020-10-27: 1 mg via ORAL
  Filled 2020-10-27: qty 1

## 2020-10-27 NOTE — ED Notes (Signed)
Charge nurse aware of ETOH level.

## 2020-10-27 NOTE — ED Triage Notes (Signed)
Pt in via EMS from home with c/o not feeling well. Pt states that he has had too much to drink.

## 2020-10-27 NOTE — ED Notes (Signed)
Pt in recliner. Conversing with staff

## 2020-10-27 NOTE — ED Notes (Signed)
Pt provided with blanket, meal tray, and cup of water per request.  Pt stated the "doctor was by to see me, he was here for about 30 seconds and then said he'd come back and left for something." Pt wants to speak to MD again, EDP notified. EDP states he will come back around and talk to pt. Pt notified

## 2020-10-27 NOTE — ED Notes (Signed)
Pt given sandwich tray 

## 2020-10-27 NOTE — ED Notes (Signed)
EDP states pt is cleared to go home. Pt states if we call a cab he can pay for it. Pt to bathroom, will provide pt with cab phone number and phone when he returns

## 2020-10-27 NOTE — ED Triage Notes (Signed)
Pt to ED via [facility] from home for a fall. Pt states that he fell and had to crawl up a 35 foot ramp. Pt has abrasions on his left leg. Pt states "I' Chong't feel good at all, I feel terrible". Pt states that he seen his PCP on Tuesday and she started him on a new medication because she knows he drinks but he has not picked it up yet. Pt states that he has not had anything to drink since Friday. Pt is in NAD.

## 2020-10-27 NOTE — ED Notes (Signed)
Pt provided with phone to call cab

## 2020-10-27 NOTE — ED Notes (Signed)
Pt denies SI, HI, AH/VH  Pt states he drank a "medium" sized bottle of 100 proof vodka last night, and had a "small glass of wine for late breakfast to watch the football game."  Pt states he hasn't had anything to eat since yesterday

## 2020-10-27 NOTE — ED Notes (Signed)
Signature pad unavailable. Pt verbally acknowledges discharge instructions, no further questions

## 2020-10-27 NOTE — ED Notes (Signed)
EDP at bedside to discuss pt tremors. Pt requesting ativan

## 2020-10-27 NOTE — ED Provider Notes (Signed)
[facility] Emergency Department Provider Note   ____________________________________________    I have reviewed the triage vital signs and the nursing notes.   HISTORY  Chief Complaint Fall and Alcohol Intoxication     HPI Miguel [name] is a 59 y.o. male who presents after a fall after drinking alcohol.  Patient well-known to our department.  Patient reports that he was drinking significantly most of the day, fell and had to crawl up the wheelchair ramp at his house.  Complains of abrasions to his leg.  Denies head injury.  No neck pain chest pain or back pain.  No abdominal pain nausea or vomiting complains of overall malaise since starting new medication with his doctor  Past Medical History:  Diagnosis Date  . Alcohol abuse   . Anxiety   . Asthma   . Family history of breast cancer   . GERD (gastroesophageal reflux disease)   . Gout   . Hx of colonic polyps   . Hypertension   . Kidney stone   . 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 OCD (obsessive compulsive disorder)   . Renal colic     Patient Active Problem List   Diagnosis Date Noted  . Genetic testing 07/15/2020  . 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 Right ankle pain 06/26/2020  . Family history of breast cancer   . Hx of colonic polyps   . Hospital discharge follow-up 06/05/2020  . Kidney function test abnormal 06/05/2020  . Abdominal pain 04/12/2020  . Hypertensive urgency 04/12/2020  . Alcohol withdrawal ([place]) 03/04/2020  . Right-sided chest wall pain 02/28/2020  . Alcohol abuse with alcohol-induced mood disorder ([place]) 12/02/2019  . Moderate episode of recurrent major depressive disorder ([place])   . Health care maintenance 10/26/2019  . Right knee pain 10/26/2019  . Generalized anxiety disorder 10/14/2019  . MDD (major depressive disorder), recurrent severe, without psychosis ([place]) 07/27/2019  . Social anxiety disorder 07/27/2019  .  Left-sided chest wall pain 06/14/2019  . Alcoholic cirrhosis of liver without ascites ([place]) 06/14/2019  . Alcohol abuse 06/14/2019  . AKI (acute kidney injury) ([place]) 05/27/2019  . Alcohol withdrawal syndrome without complication ([place]) 16/08/9603  . Alcoholic intoxication without complication ([place])   . Sepsis ([place]) 03/08/2019  . Breast lump or mass 10/04/2018  . Sepsis secondary to UTI ([place]) 09/14/2018  . Asthma 07/07/2018  . GERD (gastroesophageal reflux disease) 07/07/2018  . OCD (obsessive compulsive disorder) 07/07/2018  . Self-inflicted laceration of left wrist ([place]) 03/10/2018  . Leg hematoma 12/25/2017  . Suicide and self-inflicted injury by cutting and piercing instrument ([place]) 03/10/2017  . Severe recurrent major 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 ([place]) 03/09/2017  . Substance induced mood disorder ([place]) 08/15/2016  . Involuntary commitment 08/15/2016  . Alcohol use disorder, severe, dependence ([place]) 02/05/2016  . Hypertension 12/05/2015  . Tachycardia 12/05/2015  . Gout 11/13/2015  . Chronic back pain 05/02/2015    Past Surgical History:  Procedure Laterality Date  . CHOLECYSTECTOMY  2012  . COLONOSCOPY    . COLONOSCOPY WITH PROPOFOL N/A 05/28/2020   Procedure: COLONOSCOPY WITH PROPOFOL;  Surgeon: [name], MD;  Location: [facility] ENDOSCOPY;  Service: Gastroenterology;  Laterality: N/A;  . EXTRACORPOREAL SHOCK WAVE LITHOTRIPSY Left 01/12/2019   Procedure: EXTRACORPOREAL SHOCK WAVE LITHOTRIPSY (ESWL);  Surgeon: Billey Co, MD;  Location: [facility] ORS;  Service: Urology;  Laterality: Left;  . UPPER GI ENDOSCOPY  Prior to Admission medications   Medication Sig Start Date End Date Taking? Authorizing Provider  allopurinol (ZYLOPRIM) 300 MG tablet TAKE ONE TABLET BY MOUTH EVERY DAY 09/18/20   Iloabachie, Chioma E, NP  ascorbic acid (VITAMIN C) 500 MG tablet Take by mouth. 08/01/19   [provider]  citalopram (CELEXA) 20 MG tablet Take 1 tablet (20 mg total)  by mouth daily. 03/21/20 03/21/21  Clapacs, Madie Reno, MD  Fluticasone-Salmeterol (ADVAIR DISKUS) 100-50 MCG/DOSE AEPB Inhale 1 puff into the lungs 2 (two) times daily. 03/21/20   Clapacs, Madie Reno, MD  folic acid (FOLVITE) 1 MG tablet Take 1 tablet (1 mg total) by mouth daily. 07/16/20   Iloabachie, Chioma E, NP  lisinopril (ZESTRIL) 40 MG tablet TAKE ONE TABLET BY MOUTH EVERY DAY 09/18/20   Iloabachie, Chioma E, NP  pantoprazole (PROTONIX) 40 MG tablet Take 1 tablet (40 mg total) by mouth daily. 10/15/20   Iloabachie, Chioma E, NP  thiamine 100 MG tablet Take 1 tablet (100 mg total) by mouth daily. 10/22/20   Iloabachie, Chioma E, NP  VENTOLIN HFA 108 (90 Base) MCG/ACT inhaler INHALE 2 PUFFS EVERY 4 HOURS AS NEEDED 10/22/20   Iloabachie, Chioma E, NP     Allergies Percocet [oxycodone-acetaminophen]  Family History  Problem Relation Age of Onset  . Alcohol abuse Father   . Breast cancer Mother 70    Social History Social History   Tobacco Use  . Smoking status: Former Smoker    Types: Cigarettes  . Smokeless tobacco: Never Used  . Tobacco comment: quit 30 years ago  Vaping Use  . Vaping Use: Never used  Substance Use Topics  . Alcohol use: Yes    Alcohol/week: 12.0 standard drinks    Types: 12 Shots of liquor per week  . Drug use: No    Review of Systems  Constitutional: No fever/chills Eyes: No visual changes.  ENT: No sore throat. Cardiovascular: Denies chest pain. Respiratory: Denies shortness of breath. Gastrointestinal: No abdominal pain.     Genitourinary: Negative for dysuria. Musculoskeletal: Negative for back pain. Skin: Abrasion Neurological: Negative for headaches or weakness   ____________________________________________   PHYSICAL EXAM:  VITAL SIGNS: ED Triage Vitals  Enc Vitals Group     BP 10/27/20 1454 102/73     Pulse Rate 10/27/20 1454 95     Resp 10/27/20 1454 16     Temp 10/27/20 1454 98 F (36.7 C)     Temp src --      SpO2 10/27/20 1454 96 %      Weight --      Height --      Head Circumference --      Peak Flow --      Pain Score 10/27/20 1455 0     Pain Loc --      Pain Edu? --      Excl. in [place]? --     Constitutional: Alert and oriented. No acute distress.  Eyes: Conjunctivae are normal.  Head: Atraumatic. Nose: No swelling epistaxis Mouth/Throat: Mucous membranes are moist.   Neck:  Painless ROM, no pain with axial load, no vertebral tenderness palpation Cardiovascular: Normal rate, regular rhythm. Grossly normal heart sounds.  Good peripheral circulation.  No chest wall tenderness palpation Respiratory: Normal respiratory effort.  No retractions. Lungs CTAB. Gastrointestinal: Soft and nontender. No distention.  No CVA tenderness.  Musculoskeletal: Mild abrasions left lower leg, normal range of motion of the legs, no pain with axial load of  both legs, normal range of motion of the upper extremities without pain, no vertebral tenderness to palpation, no bruising to the back. Neurologic:  Normal speech and language. No gross focal neurologic deficits are appreciated.  Skin:  Skin is warm, dry  Psychiatric: Mood and affect are normal. Speech and behavior are normal.  ____________________________________________   LABS (all labs ordered are listed, but only abnormal results are displayed)  Labs Reviewed  ETHANOL - Abnormal; Notable for the following components:      Result Value   Alcohol, Ethyl (B) 402 (*)    All other components within normal limits  CBC WITH DIFFERENTIAL/PLATELET - Abnormal; Notable for the following components:   RDW 18.5 (*)    All other components within normal limits  COMPREHENSIVE METABOLIC PANEL - Abnormal; Notable for the following components:   Glucose, Bld 101 (*)    Calcium 8.8 (*)    All other components within normal limits    ____________________________________________  EKG  None ____________________________________________  RADIOLOGY  None ____________________________________________   PROCEDURES  Procedure(s) performed: No  Procedures   Critical Care performed: No ____________________________________________   INITIAL IMPRESSION / ASSESSMENT AND PLAN / ED COURSE  Pertinent labs & imaging results that were available during my care of the patient were reviewed by me and considered in my medical decision making (see chart for details).  Patient presents after fall.  Exam is overall quite reassuring.  Lab work most notable for alcohol of 402.  CBC and CMP are unremarkable  No indication for imaging at this time, will monitor in the emergency department, discharged when clinically sober.  ----------------------------------------- 7:27 PM on 10/27/2020 -----------------------------------------  Patient clinically sober, asking to leave, appropriate for discharge at this time    ____________________________________________   FINAL CLINICAL IMPRESSION(S) / ED DIAGNOSES  Final diagnoses:  Alcoholic intoxication without complication ([place])  Fall in home, initial encounter        Note:  This document was prepared using Dragon voice recognition software and may include unintentional dictation errors.   Lavonia Drafts, MD 10/27/20 [phone]

## 2020-11-09 ENCOUNTER — Emergency Department
Admission: EM | Admit: 2020-11-09 | Discharge: 2020-11-09 | Disposition: A | Discharge status: Home / Self Care | Payer: Medicaid Other | Attending: Emergency Medicine | Admitting: Emergency Medicine

## 2020-11-09 ENCOUNTER — Emergency Department: Payer: Medicaid Other

## 2020-11-09 ENCOUNTER — Other Ambulatory Visit: Payer: Self-pay

## 2020-11-09 DIAGNOSIS — Z87891 Personal history of nicotine dependence: Secondary | ICD-10-CM | POA: Insufficient documentation

## 2020-11-09 DIAGNOSIS — I10 Essential (primary) hypertension: Secondary | ICD-10-CM | POA: Insufficient documentation

## 2020-11-09 DIAGNOSIS — W19XXXA Unspecified fall, initial encounter: Secondary | ICD-10-CM

## 2020-11-09 DIAGNOSIS — J45909 Unspecified asthma, uncomplicated: Secondary | ICD-10-CM | POA: Insufficient documentation

## 2020-11-09 DIAGNOSIS — Z79899 Other long term (current) drug therapy: Secondary | ICD-10-CM | POA: Insufficient documentation

## 2020-11-09 DIAGNOSIS — W010XXA Fall on same level from slipping, tripping and stumbling without subsequent striking against object, initial encounter: Secondary | ICD-10-CM | POA: Insufficient documentation

## 2020-11-09 DIAGNOSIS — M25511 Pain in right shoulder: Secondary | ICD-10-CM | POA: Insufficient documentation

## 2020-11-09 DIAGNOSIS — F10920 Alcohol use, unspecified with intoxication, uncomplicated: Secondary | ICD-10-CM

## 2020-11-09 DIAGNOSIS — F10220 Alcohol dependence with intoxication, uncomplicated: Secondary | ICD-10-CM | POA: Insufficient documentation

## 2020-11-09 DIAGNOSIS — Y92007 Garden or yard of unspecified non-institutional (private) residence as the place of occurrence of the external cause: Secondary | ICD-10-CM | POA: Insufficient documentation

## 2020-11-09 DIAGNOSIS — M25512 Pain in left shoulder: Secondary | ICD-10-CM | POA: Insufficient documentation

## 2020-11-09 NOTE — ED Provider Notes (Signed)
[facility] Emergency Department Provider Note   ____________________________________________   Event Date/Time   First MD Initiated Contact with Patient 11/09/20 1622     (approximate)  I have reviewed the triage vital signs and the nursing notes.   HISTORY  Chief Complaint Fall    HPI Miguel [name] is a 59 y.o. male With a past medical history of severe alcohol abuse who presents after a fall in his yard resulting in bilateral shoulder pain.  Patient admits to heavy EtOH use today.  Patient describes aching, 10/10, nonradiating bilateral shoulder pain that is worse with any movement at this joint.  Patient denies any relieving factors.  Patient denies taking any medications prior to arrival.  Patient currently denies any vision changes, tinnitus, difficulty speaking, facial droop, sore throat, chest pain, shortness of breath, abdominal pain, nausea/vomiting/diarrhea, dysuria, or weakness/numbness/paresthesias in any extremity         Past Medical History:  Diagnosis Date  . Alcohol abuse   . Anxiety   . Asthma   . Family history of breast cancer   . GERD (gastroesophageal reflux disease)   . Gout   . Hx of colonic polyps   . Hypertension   . Kidney stone   . 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 OCD (obsessive compulsive disorder)   . Renal colic     Patient Active Problem List   Diagnosis Date Noted  . Genetic testing 07/15/2020  . 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 Right ankle pain 06/26/2020  . Family history of breast cancer   . Hx of colonic polyps   . Hospital discharge follow-up 06/05/2020  . Kidney function test abnormal 06/05/2020  . Abdominal pain 04/12/2020  . Hypertensive urgency 04/12/2020  . Alcohol withdrawal ([place]) 03/04/2020  . Right-sided chest wall pain 02/28/2020  . Alcohol abuse with alcohol-induced mood disorder ([place]) 12/02/2019  . Moderate episode of recurrent major depressive  disorder ([place])   . Health care maintenance 10/26/2019  . Right knee pain 10/26/2019  . Generalized anxiety disorder 10/14/2019  . MDD (major depressive disorder), recurrent severe, without psychosis ([place]) 07/27/2019  . Social anxiety disorder 07/27/2019  . Left-sided chest wall pain 06/14/2019  . Alcoholic cirrhosis of liver without ascites ([place]) 06/14/2019  . Alcohol abuse 06/14/2019  . AKI (acute kidney injury) ([place]) 05/27/2019  . Alcohol withdrawal syndrome without complication ([place]) 38/18/2993  . Alcoholic intoxication without complication ([place])   . Sepsis ([place]) 03/08/2019  . Breast lump or mass 10/04/2018  . Sepsis secondary to UTI ([place]) 09/14/2018  . Asthma 07/07/2018  . GERD (gastroesophageal reflux disease) 07/07/2018  . OCD (obsessive compulsive disorder) 07/07/2018  . Self-inflicted laceration of left wrist ([place]) 03/10/2018  . Leg hematoma 12/25/2017  . Suicide and self-inflicted injury by cutting and piercing instrument ([place]) 03/10/2017  . Severe recurrent major 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 ([place]) 03/09/2017  . Substance induced mood disorder ([place]) 08/15/2016  . Involuntary commitment 08/15/2016  . Alcohol use disorder, severe, dependence ([place]) 02/05/2016  . Hypertension 12/05/2015  . Tachycardia 12/05/2015  . Gout 11/13/2015  . Chronic back pain 05/02/2015    Past Surgical History:  Procedure Laterality Date  . CHOLECYSTECTOMY  2012  . COLONOSCOPY    . COLONOSCOPY WITH PROPOFOL N/A 05/28/2020   Procedure: COLONOSCOPY WITH PROPOFOL;  Surgeon: [name], MD;  [facility] ENDOSCOPY;  Service: Gastroenterology;  Laterality: N/A;  . EXTRACORPOREAL SHOCK WAVE LITHOTRIPSY Left 01/12/2019  Procedure: EXTRACORPOREAL SHOCK WAVE LITHOTRIPSY (ESWL);  Surgeon: Billey Co, MD;  Location: [facility] ORS;  Service: Urology;  Laterality: Left;  . UPPER GI ENDOSCOPY      Prior to Admission medications   Medication Sig Start Date End Date Taking? Authorizing Provider   allopurinol (ZYLOPRIM) 300 MG tablet TAKE ONE TABLET BY MOUTH EVERY DAY 09/18/20   Iloabachie, Chioma E, NP  ascorbic acid (VITAMIN C) 500 MG tablet Take by mouth. 08/01/19   [provider]  citalopram (CELEXA) 20 MG tablet Take 1 tablet (20 mg total) by mouth daily. 03/21/20 03/21/21  Clapacs, Madie Reno, MD  Fluticasone-Salmeterol (ADVAIR DISKUS) 100-50 MCG/DOSE AEPB Inhale 1 puff into the lungs 2 (two) times daily. 03/21/20   Clapacs, Madie Reno, MD  folic acid (FOLVITE) 1 MG tablet Take 1 tablet (1 mg total) by mouth daily. 07/16/20   Iloabachie, Chioma E, NP  lisinopril (ZESTRIL) 40 MG tablet TAKE ONE TABLET BY MOUTH EVERY DAY 09/18/20   Iloabachie, Chioma E, NP  pantoprazole (PROTONIX) 40 MG tablet Take 1 tablet (40 mg total) by mouth daily. 10/15/20   Iloabachie, Chioma E, NP  thiamine 100 MG tablet Take 1 tablet (100 mg total) by mouth daily. 10/22/20   Iloabachie, Chioma E, NP  VENTOLIN HFA 108 (90 Base) MCG/ACT inhaler INHALE 2 PUFFS EVERY 4 HOURS AS NEEDED 10/22/20   Iloabachie, Chioma E, NP    Allergies Percocet [oxycodone-acetaminophen]  Family History  Problem Relation Age of Onset  . Alcohol abuse Father   . Breast cancer Mother 18    Social History Social History   Tobacco Use  . Smoking status: Former Smoker    Types: Cigarettes  . Smokeless tobacco: Never Used  . Tobacco comment: quit 30 years ago  Vaping Use  . Vaping Use: Never used  Substance Use Topics  . Alcohol use: Yes    Alcohol/week: 12.0 standard drinks    Types: 12 Shots of liquor per week  . Drug use: No    Review of Systems Constitutional: No fever/chills Eyes: No visual changes. ENT: No sore throat. Cardiovascular: Denies chest pain. Respiratory: Denies shortness of breath. Gastrointestinal: No abdominal pain.  No nausea, no vomiting.  No diarrhea. Genitourinary: Negative for dysuria. Musculoskeletal: Positive for acute pain in bilateral shoulders Skin: Negative for rash. Neurological: Negative  for headaches, weakness/numbness/paresthesias in any extremity Psychiatric: Negative for suicidal ideation/homicidal ideation   ____________________________________________   PHYSICAL EXAM:  VITAL SIGNS: ED Triage Vitals  Enc Vitals Group     BP 11/09/20 1450 (!) 135/109     Pulse Rate 11/09/20 1450 99     Resp 11/09/20 1450 20     Temp 11/09/20 1450 99.1 F (37.3 C)     Temp Source 11/09/20 1450 Oral     SpO2 11/09/20 1450 98 %     Weight 11/09/20 1450 200 lb (90.7 kg)     Height 11/09/20 1450 6' (1.829 m)     Head Circumference --      Peak Flow --      Pain Score 11/09/20 1458 10     Pain Loc --      Pain Edu? --      Excl. in [place]? --    Constitutional: Alert and oriented. Well appearing and in no acute distress. Eyes: Conjunctivae are normal. PERRL. Head: Atraumatic. Nose: No congestion/rhinnorhea. Mouth/Throat: Mucous membranes are moist. Neck: No stridor Cardiovascular: Grossly normal heart sounds.  Good peripheral circulation. Respiratory: Normal respiratory effort.  No retractions. Gastrointestinal: Soft and nontender. No distention. Musculoskeletal: No obvious deformities.  Painful range of motion at bilateral shoulder joints actively or passively Neurologic:  Normal speech and language. No gross focal neurologic deficits are appreciated. Skin:  Skin is warm and dry. No rash noted. Psychiatric: Mood and affect are normal. Speech and behavior are normal.  ____________________________________________   LABS (all labs ordered are listed, but only abnormal results are displayed)  Labs Reviewed - No data to display  RADIOLOGY  ED MD interpretation: 2 view x-ray of the right and left shoulder did not show any evidence of acute abnormalities including no fractures, dislocations, or intra-articular debris/air  One-view portable x-ray of the chest shows no evidence of pneumonia, pneumothorax, or widened mediastinum  Official radiology report(s): DG Shoulder  Right  Result Date: 11/09/2020 CLINICAL DATA:  RIGHT shoulder pain post fall EXAM: RIGHT SHOULDER - 2+ VIEW COMPARISON:  None FINDINGS: Osseous mineralization decreased. AC joint alignment normal. Visualized RIGHT ribs intact. No acute fracture, dislocation, or bone destruction. IMPRESSION: No acute abnormalities. Electronically Signed   By: Lavonia Dana M.D.   On: 11/09/2020 17:00   DG Chest Port 1 View  Result Date: 11/09/2020 CLINICAL DATA:  BILATERAL shoulder pain post fall, asthma, hypertension, former smoker EXAM: PORTABLE CHEST 1 VIEW COMPARISON:  Portable exam 1640 hours compared to 10/18/2020 FINDINGS: Normal heart size, mediastinal contours, and pulmonary vascularity. Minimal bibasilar atelectasis. Lungs otherwise clear. No infiltrate, pleural effusion or pneumothorax. No acute osseous findings. IMPRESSION: Minimal bibasilar atelectasis. Electronically Signed   By: Lavonia Dana M.D.   On: 11/09/2020 16:59   DG Shoulder Left  Result Date: 11/09/2020 CLINICAL DATA:  Pain post fall EXAM: LEFT SHOULDER - 2+ VIEW COMPARISON:  None FINDINGS: Osseous demineralization. AC joint alignment normal. Visualized LEFT ribs intact. No fracture, dislocation, or bone destruction. IMPRESSION: No acute abnormalities. Electronically Signed   By: Lavonia Dana M.D.   On: 11/09/2020 16:58    ____________________________________________   PROCEDURES  Procedure(s) performed (including Critical Care):  Procedures   ____________________________________________   INITIAL IMPRESSION / ASSESSMENT AND PLAN / ED COURSE  As part of my medical decision making, I reviewed the following data within the [facility] notes reviewed and incorporated, Labs reviewed, Old chart reviewed, Radiograph reviewed and Notes from prior ED visits reviewed and incorporated        59 year old male with a history of heavy alcohol abuse today who presents after a mechanical fall complaining of bilateral  shoulder pain Given history, exam and workup I have low suspicion for fracture, dislocation, significant ligamentous injury, septic arthritis, gout flare, new autoimmune arthropathy, or gonococcal arthropathy.  Interventions: X-ray of bilateral shoulders and chest showed no evidence of acute abnormalities Disposition: Discharge home with strict return precautions and instructions for prompt primary care follow up in the next week.      ____________________________________________   FINAL CLINICAL IMPRESSION(S) / ED DIAGNOSES  Final diagnoses:  Fall, initial encounter  Alcoholic intoxication without complication ([place])  Acute pain of both shoulders     ED Discharge Orders    None       Note:  This document was prepared using Dragon voice recognition software and may include unintentional dictation errors.   Naaman Plummer, MD 11/09/20 [phone]

## 2020-11-09 NOTE — ED Notes (Signed)
PT very unsteady on his feet.  Unable to stand without assistance.  BP low at this time with stable MAP, pt is not orthostatic.  Will allow pt to rest in hallway bed until he is more steady to discharge as he will be taking a taxi home.

## 2020-11-09 NOTE — ED Triage Notes (Signed)
Pt presents via EMS s/p fall in yard. Reports ETOH use today.

## 2020-11-09 NOTE — ED Notes (Signed)
PT ASSISTED to ambulate around unit. Pt stable on feet. Given [place] sandwich and vs stable.

## 2020-11-15 IMAGING — DX DG CHEST 1V PORT
1 series · 1 of 1 positions shown · non-contrast
Comparison: 06/13/2019

CLINICAL DATA: Recent fall, cough

EXAM:
PORTABLE CHEST 1 VIEW

[chest ap]
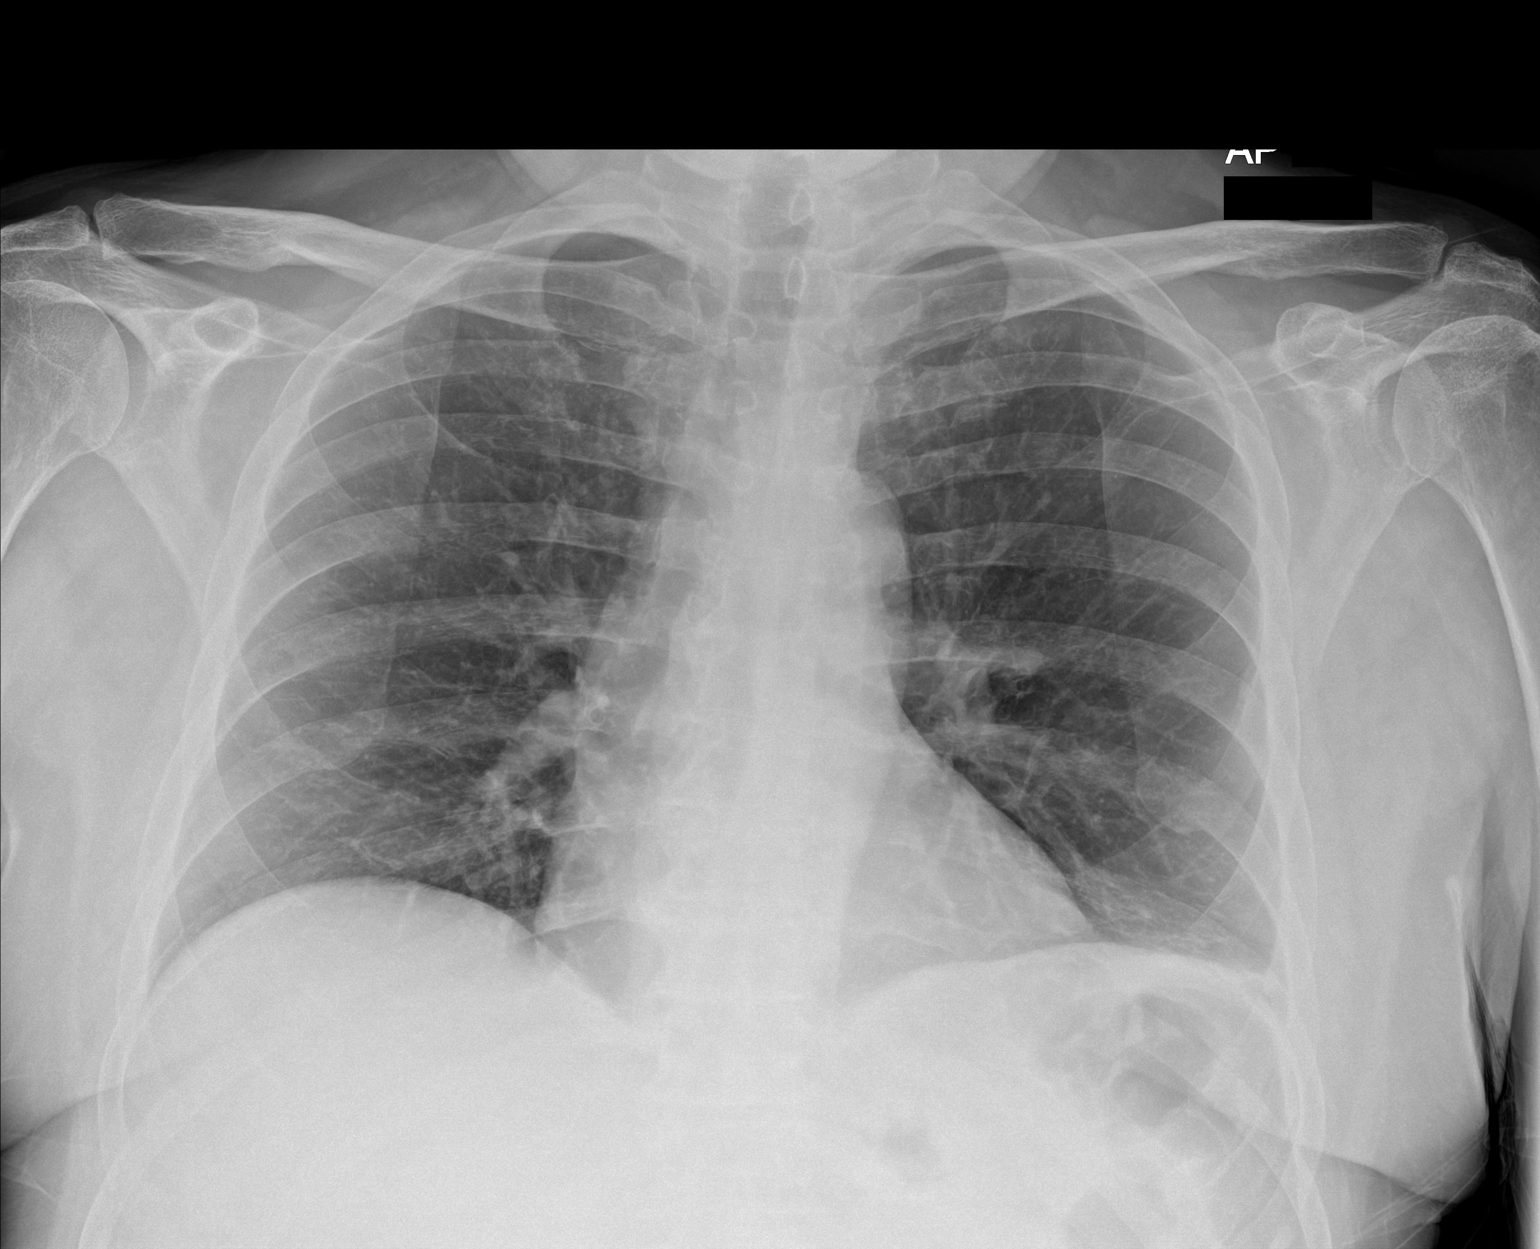

[1 of 1 positions shown; findings below may reference images not displayed]

FINDINGS: Cardiac shadow is within normal limits. The lungs are clear
bilaterally. No focal infiltrate or sizable. No bony abnormality is
noted.
IMPRESSION: No active disease.

## 2020-11-28 ENCOUNTER — Inpatient Hospital Stay
Admission: EM | Admit: 2020-11-28 | Discharge: 2020-11-30 | DRG: 312 | Disposition: A | Discharge status: Home / Self Care | Payer: Medicaid Other | Attending: Internal Medicine | Admitting: Internal Medicine

## 2020-11-28 ENCOUNTER — Emergency Department: Payer: Medicaid Other

## 2020-11-28 ENCOUNTER — Other Ambulatory Visit: Payer: Self-pay

## 2020-11-28 DIAGNOSIS — I959 Hypotension, unspecified: Secondary | ICD-10-CM | POA: Diagnosis present

## 2020-11-28 DIAGNOSIS — E860 Dehydration: Secondary | ICD-10-CM | POA: Diagnosis present

## 2020-11-28 DIAGNOSIS — F419 Anxiety disorder, unspecified: Secondary | ICD-10-CM | POA: Diagnosis present

## 2020-11-28 DIAGNOSIS — J9811 Atelectasis: Secondary | ICD-10-CM | POA: Diagnosis present

## 2020-11-28 DIAGNOSIS — I951 Orthostatic hypotension: Principal | ICD-10-CM | POA: Diagnosis present

## 2020-11-28 DIAGNOSIS — Z87891 Personal history of nicotine dependence: Secondary | ICD-10-CM

## 2020-11-28 DIAGNOSIS — F32A Depression, unspecified: Secondary | ICD-10-CM | POA: Diagnosis present

## 2020-11-28 DIAGNOSIS — Z87442 Personal history of urinary calculi: Secondary | ICD-10-CM

## 2020-11-28 DIAGNOSIS — Z20822 Contact with and (suspected) exposure to covid-19: Secondary | ICD-10-CM | POA: Diagnosis present

## 2020-11-28 DIAGNOSIS — J45909 Unspecified asthma, uncomplicated: Secondary | ICD-10-CM | POA: Diagnosis present

## 2020-11-28 DIAGNOSIS — E876 Hypokalemia: Secondary | ICD-10-CM | POA: Diagnosis present

## 2020-11-28 DIAGNOSIS — Z9049 Acquired absence of other specified parts of digestive tract: Secondary | ICD-10-CM

## 2020-11-28 DIAGNOSIS — I10 Essential (primary) hypertension: Secondary | ICD-10-CM | POA: Diagnosis present

## 2020-11-28 DIAGNOSIS — Z79899 Other long term (current) drug therapy: Secondary | ICD-10-CM

## 2020-11-28 DIAGNOSIS — R439 Unspecified disturbances of smell and taste: Secondary | ICD-10-CM | POA: Diagnosis present

## 2020-11-28 DIAGNOSIS — M109 Gout, unspecified: Secondary | ICD-10-CM | POA: Diagnosis present

## 2020-11-28 DIAGNOSIS — E872 Acidosis: Secondary | ICD-10-CM | POA: Diagnosis present

## 2020-11-28 DIAGNOSIS — F10239 Alcohol dependence with withdrawal, unspecified: Secondary | ICD-10-CM | POA: Diagnosis not present

## 2020-11-28 DIAGNOSIS — R197 Diarrhea, unspecified: Secondary | ICD-10-CM | POA: Diagnosis present

## 2020-11-28 DIAGNOSIS — K219 Gastro-esophageal reflux disease without esophagitis: Secondary | ICD-10-CM | POA: Diagnosis present

## 2020-11-28 DIAGNOSIS — F429 Obsessive-compulsive disorder, unspecified: Secondary | ICD-10-CM | POA: Diagnosis present

## 2020-11-28 DIAGNOSIS — Z7951 Long term (current) use of inhaled steroids: Secondary | ICD-10-CM

## 2020-11-28 DIAGNOSIS — R7989 Other specified abnormal findings of blood chemistry: Secondary | ICD-10-CM

## 2020-11-28 DIAGNOSIS — Z8719 Personal history of other diseases of the digestive system: Secondary | ICD-10-CM

## 2020-11-28 DIAGNOSIS — Z803 Family history of malignant neoplasm of breast: Secondary | ICD-10-CM

## 2020-11-28 DIAGNOSIS — Z885 Allergy status to narcotic agent status: Secondary | ICD-10-CM

## 2020-11-28 LAB — CBC WITH DIFFERENTIAL/PLATELET
Abs Immature Granulocytes: 0.02 10*3/uL (ref 0.00–0.07)
Basophils Absolute: 0.1 10*3/uL (ref 0.0–0.1)
Basophils Relative: 2 %
Eosinophils Absolute: 0.1 10*3/uL (ref 0.0–0.5)
Eosinophils Relative: 2 %
HCT: 39.4 % (ref 39.0–52.0)
Hemoglobin: 12.4 g/dL — ABNORMAL LOW (ref 13.0–17.0)
Immature Granulocytes: 0 %
Lymphocytes Relative: 32 %
Lymphs Abs: 2.2 10*3/uL (ref 0.7–4.0)
MCH: 27.4 pg (ref 26.0–34.0)
MCHC: 31.5 g/dL (ref 30.0–36.0)
MCV: 87.2 fL (ref 80.0–100.0)
Monocytes Absolute: 0.7 10*3/uL (ref 0.1–1.0)
Monocytes Relative: 10 %
Neutro Abs: 3.7 10*3/uL (ref 1.7–7.7)
Neutrophils Relative %: 54 %
Platelets: 352 10*3/uL (ref 150–400)
RBC: 4.52 MIL/uL (ref 4.22–5.81)
RDW: 18 % — ABNORMAL HIGH (ref 11.5–15.5)
WBC: 6.8 10*3/uL (ref 4.0–10.5)
nRBC: 0 % (ref 0.0–0.2)

## 2020-11-28 LAB — COMPREHENSIVE METABOLIC PANEL
ALT: 14 U/L (ref 0–44)
AST: 12 U/L — ABNORMAL LOW (ref 15–41)
Albumin: 4 g/dL (ref 3.5–5.0)
Alkaline Phosphatase: 54 U/L (ref 38–126)
Anion gap: 10 (ref 5–15)
BUN: 22 mg/dL (ref 8–23)
CO2: 23 mmol/L (ref 22–32)
Calcium: 8.6 mg/dL — ABNORMAL LOW (ref 8.9–10.3)
Chloride: 111 mmol/L (ref 98–111)
Creatinine, Ser: 1.11 mg/dL (ref 0.61–1.24)
GFR, Estimated: >60 mL/min (ref >60)
Glucose, Bld: 112 mg/dL — ABNORMAL HIGH (ref 70–99)
Potassium: 4.2 mmol/L (ref 3.5–5.1)
Sodium: 144 mmol/L (ref 135–145)
Total Bilirubin: 0.5 mg/dL (ref 0.3–1.2)
Total Protein: 7 g/dL (ref 6.5–8.1)

## 2020-11-28 LAB — URINALYSIS, COMPLETE (UACMP) WITH MICROSCOPIC
Bacteria, UA: NONE SEEN
Bilirubin Urine: NEGATIVE
Glucose, UA: NEGATIVE mg/dL
Hgb urine dipstick: NEGATIVE
Ketones, ur: NEGATIVE mg/dL
Leukocytes,Ua: NEGATIVE
Nitrite: NEGATIVE
Protein, ur: NEGATIVE mg/dL
Specific Gravity, Urine: 1.014 (ref 1.005–1.030)
pH: 6 (ref 5.0–8.0)

## 2020-11-28 LAB — LACTIC ACID, PLASMA
Lactic Acid, Venous: 2.1 mmol/L (ref 0.5–1.9)
Lactic Acid, Venous: 3.2 mmol/L (ref 0.5–1.9)
Lactic Acid, Venous: 2.5 mmol/L (ref 0.5–1.9)

## 2020-11-28 LAB — TROPONIN I (HIGH SENSITIVITY)
Troponin I (High Sensitivity): 3 ng/L (ref <18)
Troponin I (High Sensitivity): 3 ng/L (ref <18)

## 2020-11-28 LAB — PROCALCITONIN: Procalcitonin: <0.1 ng/mL

## 2020-11-28 MED ORDER — SODIUM CHLORIDE 0.9 % IV BOLUS
1000.0000 mL | Freq: Once | INTRAVENOUS | Status: AC
  Administered 2020-11-28: 1000 mL via INTRAVENOUS

## 2020-11-28 MED ORDER — LACTATED RINGERS IV BOLUS
1000.0000 mL | Freq: Once | INTRAVENOUS | Status: AC
  Administered 2020-11-28: 1000 mL via INTRAVENOUS

## 2020-11-28 MED ORDER — ONDANSETRON HCL 4 MG PO TABS: 4.0000 mg | ORAL_TABLET | Freq: Four times a day (QID) | ORAL | Status: DC | PRN

## 2020-11-28 MED ORDER — MOMETASONE FURO-FORMOTEROL FUM 100-5 MCG/ACT IN AERO
2.0000 | INHALATION_SPRAY | Freq: Two times a day (BID) | RESPIRATORY_TRACT | Status: DC
  Administered 2020-11-29 – 2020-11-30 (×3): 2 via RESPIRATORY_TRACT
  Filled 2020-11-28 (×2): qty 8.8

## 2020-11-28 MED ORDER — ASCORBIC ACID 500 MG PO TABS
1000.0000 mg | ORAL_TABLET | Freq: Every day | ORAL | Status: DC
  Administered 2020-11-28 – 2020-11-30 (×3): 1000 mg via ORAL
  Filled 2020-11-28 (×3): qty 2

## 2020-11-28 MED ORDER — ENOXAPARIN SODIUM 40 MG/0.4ML ~~LOC~~ SOLN
40.0000 mg | SUBCUTANEOUS | Status: DC
  Administered 2020-11-28 – 2020-11-29 (×2): 40 mg via SUBCUTANEOUS
  Filled 2020-11-28 (×2): qty 0.4

## 2020-11-28 MED ORDER — MAGNESIUM HYDROXIDE 400 MG/5ML PO SUSP
30.0000 mL | Freq: Every day | ORAL | Status: DC | PRN
  Administered 2020-11-29: 30 mL via ORAL
  Filled 2020-11-28: qty 30

## 2020-11-28 MED ORDER — ASPIRIN EC 81 MG PO TBEC
81.0000 mg | DELAYED_RELEASE_TABLET | Freq: Every day | ORAL | Status: DC
  Administered 2020-11-28 – 2020-11-30 (×3): 81 mg via ORAL
  Filled 2020-11-28 (×3): qty 1

## 2020-11-28 MED ORDER — FOLIC ACID 1 MG PO TABS
1.0000 mg | ORAL_TABLET | Freq: Every day | ORAL | Status: DC
Start: 2020-11-28 — End: 2020-11-30
  Administered 2020-11-28 – 2020-11-30 (×3): 1 mg via ORAL
  Filled 2020-11-28 (×3): qty 1

## 2020-11-28 MED ORDER — THIAMINE HCL 100 MG PO TABS
100.0000 mg | ORAL_TABLET | Freq: Every day | ORAL | Status: DC
  Administered 2020-11-28: 100 mg via ORAL
  Filled 2020-11-28: qty 1

## 2020-11-28 MED ORDER — ACETAMINOPHEN 325 MG PO TABS: 650.0000 mg | ORAL_TABLET | Freq: Four times a day (QID) | ORAL | Status: DC | PRN

## 2020-11-28 MED ORDER — DEXAMETHASONE SODIUM PHOSPHATE 10 MG/ML IJ SOLN
10.0000 mg | Freq: Once | INTRAMUSCULAR | Status: AC
  Administered 2020-11-28: 10 mg via INTRAVENOUS
  Filled 2020-11-28: qty 1

## 2020-11-28 MED ORDER — CITALOPRAM HYDROBROMIDE 20 MG PO TABS
20.0000 mg | ORAL_TABLET | Freq: Every day | ORAL | Status: DC
  Administered 2020-11-28 – 2020-11-30 (×3): 20 mg via ORAL
  Filled 2020-11-28 (×3): qty 1

## 2020-11-28 MED ORDER — ALLOPURINOL 300 MG PO TABS
300.0000 mg | ORAL_TABLET | Freq: Every day | ORAL | Status: DC
  Administered 2020-11-28 – 2020-11-30 (×3): 300 mg via ORAL
  Filled 2020-11-28 (×4): qty 1

## 2020-11-28 MED ORDER — PANTOPRAZOLE SODIUM 40 MG PO TBEC
40.0000 mg | DELAYED_RELEASE_TABLET | Freq: Every day | ORAL | Status: DC
  Administered 2020-11-28 – 2020-11-30 (×3): 40 mg via ORAL
  Filled 2020-11-28 (×3): qty 1

## 2020-11-28 MED ORDER — ACETAMINOPHEN 650 MG RE SUPP: 650.0000 mg | Freq: Four times a day (QID) | RECTAL | Status: DC | PRN

## 2020-11-28 MED ORDER — ADULT MULTIVITAMIN W/MINERALS CH
1.0000 | ORAL_TABLET | Freq: Every day | ORAL | Status: DC
  Administered 2020-11-28 – 2020-11-30 (×3): 1 via ORAL
  Filled 2020-11-28 (×3): qty 1

## 2020-11-28 MED ORDER — ONDANSETRON HCL 4 MG/2ML IJ SOLN
4.0000 mg | Freq: Four times a day (QID) | INTRAMUSCULAR | Status: DC | PRN
  Administered 2020-11-29: 4 mg via INTRAVENOUS
  Filled 2020-11-28: qty 2

## 2020-11-28 MED ORDER — SODIUM CHLORIDE 0.9 % IV SOLN: INTRAVENOUS | Status: DC

## 2020-11-28 MED ORDER — TRAZODONE HCL 50 MG PO TABS
25.0000 mg | ORAL_TABLET | Freq: Every evening | ORAL | Status: DC | PRN
  Administered 2020-11-29: 25 mg via ORAL
  Filled 2020-11-28: qty 1

## 2020-11-28 MED ORDER — ALBUTEROL SULFATE HFA 108 (90 BASE) MCG/ACT IN AERS
2.0000 | INHALATION_SPRAY | RESPIRATORY_TRACT | Status: DC | PRN
  Filled 2020-11-28: qty 6.7

## 2020-11-28 MED ORDER — SODIUM CHLORIDE 0.9 % IV SOLN
2.0000 g | Freq: Once | INTRAVENOUS | Status: AC
  Administered 2020-11-28: 2 g via INTRAVENOUS
  Filled 2020-11-28: qty 20

## 2020-11-28 NOTE — ED Triage Notes (Signed)
Pt to ED POV for chief complaint of loss of taste and weakness since noon today. Alert and oriented in triage. NAD noted.  + dizziness Hypotensive in triage

## 2020-11-28 NOTE — ED Notes (Addendum)
Pt complains of feeling faint and low BP and seeing floaters in ceiling. Md stafford made awre no further orders at this time.

## 2020-11-28 NOTE — ED Notes (Addendum)
pharmacy called for meds to be verified.

## 2020-11-28 NOTE — ED Notes (Signed)
Spoke to Lab and they will add tsh and a1c to current blood work.

## 2020-11-28 NOTE — ED Notes (Signed)
md stafford  notified about elevated lactic acid

## 2020-11-28 NOTE — ED Notes (Signed)
Patient denies pain and is resting comfortably.  

## 2020-11-28 NOTE — ED Notes (Signed)
Md stafford at bedside 

## 2020-11-28 NOTE — H&P (Signed)
[place]   PATIENT NAME: Miguel [name]    MR#:  [id number]  DATE OF BIRTH:  09/16/1961  DATE OF ADMISSION:  11/28/2020  PRIMARY CARE PHYSICIAN: Langston Reusing, NP   REQUESTING/REFERRING PHYSICIAN: Carrie Mew, MD  CHIEF COMPLAINT:   Chief Complaint  Patient presents with  . COVID sx    HISTORY OF PRESENT ILLNESS:  Miguel [name]  is a 60 y.o. Caucasian male with a known history of alcohol abuse, asthma, GERD, gout, hypertension and OCD, who presented to the ER with acute onset of generalized fatigue and malaise which have been going on for a week and got significantly worse today with associated nonproductive cough and nasal congestion, as well as loss of taste and smell.  He denied any nausea or vomiting however he had brief diarrhea. He admitted to chills but denied fever. He took about 20 doses of amoxicillin over the last week after a dentist visit for gum infection. He had 2 doses of Moderna COVID-19 vaccine.  No chest pain or palpitations.  No paresthesias or focal muscle weakness. He quit drinking alcohol 2 weeks ago and smoking cigarettes 30 years ago  Upon presentation to the ER, 19/78 and heart rate 114 and later blood pressure was down to 76/59 with a MAP of 66 and later 74/41 with a MAP of 52.  This improved after hydration to 115/79 with a heart rate of 76. His pulse oximetry is 99% on room air. He was significantly orthostatic in the ER. Blood pressure dropped from 88/61 on supine position to 58/27 on standing position. Labs revealed unremarkable CMP. High-sensitivity troponin I was three twice. Lactic acid was 2.1 and later 3.2 then 2.5. Procalcitonin was less than 0.1. CBC showed no significant abnormalities. Urinalysis showed hyaline casts. Blood cultures were drawn as well as urine culture. COVID-19 PCR is currently pending.  The patient was given IV Rocephin and 10 mg IV Decadron, 1 L bolus of IV lactated Ringer and 2 L bolus of IV normal saline. He will be  admitted to a medical monitored bed for further evaluation and management.  PAST MEDICAL HISTORY:   Past Medical History:  Diagnosis Date  . Alcohol abuse   . Anxiety   . Asthma   . Family history of breast cancer   . GERD (gastroesophageal reflux disease)   . Gout   . Hx of colonic polyps   . Hypertension   . Kidney stone   . 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 OCD (obsessive compulsive disorder)   . Renal colic     PAST SURGICAL HISTORY:   Past Surgical History:  Procedure Laterality Date  . CHOLECYSTECTOMY  2012  . COLONOSCOPY    . COLONOSCOPY WITH PROPOFOL N/A 05/28/2020   Procedure: COLONOSCOPY WITH PROPOFOL;  Surgeon: [name], MD;  Location: [facility] ENDOSCOPY;  Service: Gastroenterology;  Laterality: N/A;  . EXTRACORPOREAL SHOCK WAVE LITHOTRIPSY Left 01/12/2019   Procedure: EXTRACORPOREAL SHOCK WAVE LITHOTRIPSY (ESWL);  Surgeon: Billey Co, MD;  Location: [facility] ORS;  Service: Urology;  Laterality: Left;  . UPPER GI ENDOSCOPY      SOCIAL HISTORY:   Social History   Tobacco Use  . Smoking status: Former Smoker    Types: Cigarettes  . Smokeless tobacco: Never Used  . Tobacco comment: quit 30 years ago  Substance Use Topics  . Alcohol use: Yes    Alcohol/week: 12.0 standard drinks    Types: 12 Shots of  liquor per week    FAMILY HISTORY:   Family History  Problem Relation Age of Onset  . Alcohol abuse Father   . Breast cancer Mother 74    DRUG ALLERGIES:   Allergies  Allergen Reactions  . Percocet [Oxycodone-Acetaminophen] Nausea And Vomiting    REVIEW OF SYSTEMS:   ROS As per history of present illness. All pertinent systems were reviewed above. Constitutional, HEENT, cardiovascular, respiratory, GI, GU, musculoskeletal, neuro, psychiatric, endocrine, integumentary and hematologic systems were reviewed and are otherwise negative/unremarkable except for positive findings mentioned above in the HPI.   MEDICATIONS AT HOME:    Prior to Admission medications   Medication Sig Start Date End Date Taking? Authorizing Provider  allopurinol (ZYLOPRIM) 300 MG tablet TAKE ONE TABLET BY MOUTH EVERY DAY 09/18/20   Iloabachie, Chioma E, NP  ascorbic acid (VITAMIN C) 500 MG tablet Take by mouth. 08/01/19   [provider]  citalopram (CELEXA) 20 MG tablet Take 1 tablet (20 mg total) by mouth daily. 03/21/20 03/21/21  Clapacs, Madie Reno, MD  Fluticasone-Salmeterol (ADVAIR DISKUS) 100-50 MCG/DOSE AEPB Inhale 1 puff into the lungs 2 (two) times daily. 03/21/20   Clapacs, Madie Reno, MD  folic acid (FOLVITE) 1 MG tablet Take 1 tablet (1 mg total) by mouth daily. 07/16/20   Iloabachie, Chioma E, NP  lisinopril (ZESTRIL) 40 MG tablet TAKE ONE TABLET BY MOUTH EVERY DAY 09/18/20   Iloabachie, Chioma E, NP  pantoprazole (PROTONIX) 40 MG tablet Take 1 tablet (40 mg total) by mouth daily. 10/15/20   Iloabachie, Chioma E, NP  thiamine 100 MG tablet Take 1 tablet (100 mg total) by mouth daily. 10/22/20   Iloabachie, Chioma E, NP  VENTOLIN HFA 108 (90 Base) MCG/ACT inhaler INHALE 2 PUFFS EVERY 4 HOURS AS NEEDED 10/22/20   Iloabachie, Chioma E, NP      VITAL SIGNS:  Blood pressure 110/77, pulse 70, temperature 98.7 F (37.1 C), temperature source Oral, resp. rate 10, height 6' (1.829 m), weight 83.9 kg, SpO2 100 %.  PHYSICAL EXAMINATION:  Physical Exam  GENERAL:  60 y.o.-year-old Caucasian male patient lying in the bed with no acute distress.  EYES: Pupils equal, round, reactive to light and accommodation. No scleral icterus. Extraocular muscles intact.  HEENT: Head atraumatic, normocephalic. Oropharynx and nasopharynx clear.  NECK:  Supple, no jugular venous distention. No thyroid enlargement, no tenderness.  LUNGS: Slightly diminished bibasal breath sounds. No wheezing, rales,rhonchi or crepitation. No use of accessory muscles of respiration.  CARDIOVASCULAR: Regular rate and rhythm, S1, S2 normal. No murmurs, rubs, or gallops.  ABDOMEN:  Soft, nondistended, nontender. Bowel sounds present. No organomegaly or mass.  EXTREMITIES: No pedal edema, cyanosis, or clubbing.  NEUROLOGIC: Cranial nerves II through XII are intact. Muscle strength 5/5 in all extremities. Sensation intact. Gait not checked.  PSYCHIATRIC: The patient is alert and oriented x 3.  Normal affect and good eye contact. SKIN: No obvious rash, lesion, or ulcer.   LABORATORY PANEL:   CBC Recent Labs  Lab 11/28/20 1449  WBC 6.8  HGB 12.4*  HCT 39.4  PLT 352   ------------------------------------------------------------------------------------------------------------------  Chemistries  Recent Labs  Lab 11/28/20 1449  NA 144  K 4.2  CL 111  CO2 23  GLUCOSE 112*  BUN 22  CREATININE 1.11  CALCIUM 8.6*  AST 12*  ALT 14  ALKPHOS 54  BILITOT 0.5   ------------------------------------------------------------------------------------------------------------------  Cardiac Enzymes No results for input(s): TROPONINI in the last 168 hours. ------------------------------------------------------------------------------------------------------------------  RADIOLOGY:  DG Chest  Port 1 View  Result Date: 11/28/2020 CLINICAL DATA:  Acute onset of shortness of breath, dizziness, generalized weakness and loss of taste that began earlier today. Current history of asthma and hypertension. EXAM: PORTABLE CHEST 1 VIEW COMPARISON:  11/09/2020 and earlier, including CT chest 03/08/2019. FINDINGS: Suboptimal inspiration accounts for crowded bronchovascular markings diffusely and atelectasis in the LEFT lung base, and accentuates the cardiac silhouette. Taking this into account, cardiac silhouette upper normal in size. Lungs otherwise clear. No localized airspace consolidation. No pleural effusions. No pneumothorax. Normal pulmonary vascularity. IMPRESSION: Suboptimal inspiration accounts for mild LEFT basilar atelectasis. No acute cardiopulmonary disease otherwise.  Electronically Signed   By: Evangeline Dakin M.D.   On: 11/28/2020 15:53      IMPRESSION AND PLAN:   1. Orthostatic hypotension, likely secondary to volume depletion as evidenced by mild azotemia and hyaline cast and his urinalysis. - The patient will be admitted to a medical monitored bed. - He will be hydrated with IV normal saline. - We will check his orthostatics every 12 hours. - We will follow blood and urine cultures. - COVID-19 PCR is currently pending.  2. Elevated lactic acid, rule out sepsis given mild tachycardia initially and hypotension though his respiratory rate and temperature were within normal and procalcitonin was low. - We will follow blood cultures. - We will continue hydration with IV normal saline. - We will follow lactic acid level.  3.  Essential hypertension. - We will hold off Zestril for now and monitor blood pressure.  4.  Gout. - We will continue allopurinol.  5.  Depression. - We will continue Celexa.  6.  GERD. - Continue PPI therapy.  7.  Asthma, controlled. - We will continue as needed albuterol.  8.  DVT prophylaxis. - Subcutaneous Lovenox.   All the records are reviewed and case discussed with ED provider. The plan of care was discussed in details with the patient (and family). I answered all questions. The patient agreed to proceed with the above mentioned plan. Further management will depend upon hospital course.   CODE STATUS: Full code  Status is: Inpatient  Remains inpatient appropriate because:Ongoing diagnostic testing needed not appropriate for outpatient work up, Unsafe d/c plan, IV treatments appropriate due to intensity of illness or inability to take PO and Inpatient level of care appropriate due to severity of illness   Dispo: The patient is from: Home              Anticipated d/c is to: Home              Anticipated d/c date is: 2 days              Patient currently is not medically stable to d/c.   TOTAL TIME  TAKING CARE OF THIS PATIENT: 55 minutes.    Christel Mormon M.D on 11/28/2020 at 7:56 PM  [facility]   From 7 PM-7 AM, contact night-coverage [email or web address]  CC: Primary care physician; Langston Reusing, NP

## 2020-11-28 NOTE — ED Notes (Signed)
Patient laying  in bed in no acute distress. Fluids infusing.

## 2020-11-28 NOTE — ED Notes (Signed)
Patient eating meal.

## 2020-11-28 NOTE — ED Notes (Signed)
Provider notified of orthostatic BP results.

## 2020-11-28 NOTE — ED Notes (Signed)
Patient tolerating oral fluid

## 2020-11-28 NOTE — ED Provider Notes (Signed)
[facility] Emergency Department Provider Note  ____________________________________________  Time seen: Approximately 4:29 PM  I have reviewed the triage vital signs and the nursing notes.   HISTORY  Chief Complaint COVID sx    HPI Miguel [name] is a 60 y.o. male with a history of anxiety, GERD, hypertension, alcohol abuse who comes to the ED complaining of loss of taste, fatigue, nasal congestion, nonproductive cough for the past week.  Today he reports increased malaise.  Feels like he has been eating and drinking normally.  No vomiting, some occasional diarrhea.  No fevers body aches or sick contacts.  He has had 2 doses of the Moderna COVID-vaccine.  Denies any focal pain complaints.  He reports that home health aides that come assist his spouse at home often do not wear masks.      Past Medical History:  Diagnosis Date  . Alcohol abuse   . Anxiety   . Asthma   . Family history of breast cancer   . GERD (gastroesophageal reflux disease)   . Gout   . Hx of colonic polyps   . Hypertension   . Kidney stone   . 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 OCD (obsessive compulsive disorder)   . Renal colic      Patient Active Problem List   Diagnosis Date Noted  . Hypotension 11/28/2020  . Genetic testing 07/15/2020  . 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 Right ankle pain 06/26/2020  . Family history of breast cancer   . Hx of colonic polyps   . Hospital discharge follow-up 06/05/2020  . Kidney function test abnormal 06/05/2020  . Abdominal pain 04/12/2020  . Hypertensive urgency 04/12/2020  . Alcohol withdrawal ([place]) 03/04/2020  . Right-sided chest wall pain 02/28/2020  . Alcohol abuse with alcohol-induced mood disorder ([place]) 12/02/2019  . Moderate episode of recurrent major depressive disorder ([place])   . Health care maintenance 10/26/2019  . Right knee pain 10/26/2019  . Generalized anxiety disorder  10/14/2019  . MDD (major depressive disorder), recurrent severe, without psychosis ([place]) 07/27/2019  . Social anxiety disorder 07/27/2019  . Left-sided chest wall pain 06/14/2019  . Alcoholic cirrhosis of liver without ascites ([place]) 06/14/2019  . Alcohol abuse 06/14/2019  . AKI (acute kidney injury) ([place]) 05/27/2019  . Alcohol withdrawal syndrome without complication ([place]) 68/34/1964  . Alcoholic intoxication without complication ([place])   . Sepsis ([place]) 03/08/2019  . Breast lump or mass 10/04/2018  . Sepsis secondary to UTI ([place]) 09/14/2018  . Asthma 07/07/2018  . GERD (gastroesophageal reflux disease) 07/07/2018  . OCD (obsessive compulsive disorder) 07/07/2018  . Self-inflicted laceration of left wrist ([place]) 03/10/2018  . Leg hematoma 12/25/2017  . Suicide and self-inflicted injury by cutting and piercing instrument ([place]) 03/10/2017  . Severe recurrent major 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 ([place]) 03/09/2017  . Substance induced mood disorder ([place]) 08/15/2016  . Involuntary commitment 08/15/2016  . Alcohol use disorder, severe, dependence ([place]) 02/05/2016  . Hypertension 12/05/2015  . Tachycardia 12/05/2015  . Gout 11/13/2015  . Chronic back pain 05/02/2015     Past Surgical History:  Procedure Laterality Date  . CHOLECYSTECTOMY  2012  . COLONOSCOPY    . COLONOSCOPY WITH PROPOFOL N/A 05/28/2020   Procedure: COLONOSCOPY WITH PROPOFOL;  Surgeon: [name], MD;  Location: [facility] ENDOSCOPY;  Service: Gastroenterology;  Laterality: N/A;  . EXTRACORPOREAL SHOCK WAVE LITHOTRIPSY Left 01/12/2019   Procedure: EXTRACORPOREAL SHOCK WAVE LITHOTRIPSY (ESWL);  Surgeon: Billey Co, MD;  Location: [facility] ORS;  Service: Urology;  Laterality: Left;  . UPPER GI ENDOSCOPY       Prior to Admission medications   Medication Sig Start Date End Date Taking? Authorizing Provider  allopurinol (ZYLOPRIM) 300 MG tablet TAKE ONE TABLET BY MOUTH EVERY DAY 09/18/20   Iloabachie, Chioma E, NP   ascorbic acid (VITAMIN C) 500 MG tablet Take by mouth. 08/01/19   [provider]  citalopram (CELEXA) 20 MG tablet Take 1 tablet (20 mg total) by mouth daily. 03/21/20 03/21/21  Clapacs, Madie Reno, MD  Fluticasone-Salmeterol (ADVAIR DISKUS) 100-50 MCG/DOSE AEPB Inhale 1 puff into the lungs 2 (two) times daily. 03/21/20   Clapacs, Madie Reno, MD  folic acid (FOLVITE) 1 MG tablet Take 1 tablet (1 mg total) by mouth daily. 07/16/20   Iloabachie, Chioma E, NP  lisinopril (ZESTRIL) 40 MG tablet TAKE ONE TABLET BY MOUTH EVERY DAY 09/18/20   Iloabachie, Chioma E, NP  pantoprazole (PROTONIX) 40 MG tablet Take 1 tablet (40 mg total) by mouth daily. 10/15/20   Iloabachie, Chioma E, NP  thiamine 100 MG tablet Take 1 tablet (100 mg total) by mouth daily. 10/22/20   Iloabachie, Chioma E, NP  VENTOLIN HFA 108 (90 Base) MCG/ACT inhaler INHALE 2 PUFFS EVERY 4 HOURS AS NEEDED 10/22/20   Iloabachie, Chioma E, NP     Allergies Percocet [oxycodone-acetaminophen]   Family History  Problem Relation Age of Onset  . Alcohol abuse Father   . Breast cancer Mother 40    Social History Social History   Tobacco Use  . Smoking status: Former Smoker    Types: Cigarettes  . Smokeless tobacco: Never Used  . Tobacco comment: quit 30 years ago  Vaping Use  . Vaping Use: Never used  Substance Use Topics  . Alcohol use: Yes    Alcohol/week: 12.0 standard drinks    Types: 12 Shots of liquor per week  . Drug use: No    Review of Systems  Constitutional:   No fever positive chills.  ENT:   No sore throat. No rhinorrhea.  Positive congestion Cardiovascular:   No chest pain or syncope. Respiratory:   No dyspnea positive cough. Gastrointestinal:   Negative for abdominal pain, vomiting and diarrhea.  Musculoskeletal:   Negative for focal pain or swelling All other systems reviewed and are negative except as documented above in ROS and HPI.  ____________________________________________   PHYSICAL EXAM:  VITAL  SIGNS: ED Triage Vitals  Enc Vitals Group     BP 11/28/20 1438 (!) 71/57     Pulse Rate 11/28/20 1438 94     Resp 11/28/20 1450 18     Temp 11/28/20 1450 98.7 F (37.1 C)     Temp Source 11/28/20 1450 Oral     SpO2 11/28/20 1438 97 %     Weight 11/28/20 1435 185 lb (83.9 kg)     Height 11/28/20 1435 6' (1.829 m)     Head Circumference --      Peak Flow --      Pain Score 11/28/20 1435 0     Pain Loc --      Pain Edu? --      Excl. in [place]? --     Vital signs reviewed, nursing assessments reviewed.   Constitutional:   Alert and oriented. Non-toxic appearance. Eyes:   Conjunctivae are normal. EOMI. PERRL. ENT      Head:   Normocephalic and atraumatic.  Nose:   Wearing a mask.      Mouth/Throat:   Wearing a mask.      Neck:   No meningismus. Full ROM. Hematological/Lymphatic/Immunilogical:   No cervical lymphadenopathy. Cardiovascular:   RRR. Symmetric bilateral radial and DP pulses.  No murmurs. Cap refill less than 2 seconds. Respiratory:   Normal respiratory effort without tachypnea/retractions. Breath sounds are clear and equal bilaterally. No wheezes/rales/rhonchi. Gastrointestinal:   Soft and nontender. Non distended. There is no CVA tenderness.  No rebound, rigidity, or guarding. Musculoskeletal:   Normal range of motion in all extremities. No joint effusions.  No lower extremity tenderness.  No edema. Neurologic:   Normal speech and language.  Motor grossly intact. No acute focal neurologic deficits are appreciated.  Skin:    Skin is warm, dry and intact. No rash noted.  No petechiae, purpura, or bullae.  ____________________________________________    LABS (pertinent positives/negatives) (all labs ordered are listed, but only abnormal results are displayed) Labs Reviewed  LACTIC ACID, PLASMA - Abnormal; Notable for the following components:      Result Value   Lactic Acid, Venous 2.1 (*)    All other components within normal limits  LACTIC ACID, PLASMA -  Abnormal; Notable for the following components:   Lactic Acid, Venous 3.2 (*)    All other components within normal limits  COMPREHENSIVE METABOLIC PANEL - Abnormal; Notable for the following components:   Glucose, Bld 112 (*)    Calcium 8.6 (*)    AST 12 (*)    All other components within normal limits  CBC WITH DIFFERENTIAL/PLATELET - Abnormal; Notable for the following components:   Hemoglobin 12.4 (*)    RDW 18.0 (*)    All other components within normal limits  URINALYSIS, COMPLETE (UACMP) WITH MICROSCOPIC - Abnormal; Notable for the following components:   Color, Urine YELLOW (*)    APPearance CLEAR (*)    All other components within normal limits  LACTIC ACID, PLASMA - Abnormal; Notable for the following components:   Lactic Acid, Venous 2.5 (*)    All other components within normal limits  URINE CULTURE  CULTURE, BLOOD (ROUTINE X 2)  CULTURE, BLOOD (ROUTINE X 2)  SARS CORONAVIRUS 2 (TAT 6-24 HRS)  PROCALCITONIN  PROTIME-INR  CORTISOL-AM, BLOOD  PROCALCITONIN  TSH  HEMOGLOBIN A1C  TROPONIN I (HIGH SENSITIVITY)  TROPONIN I (HIGH SENSITIVITY)   ____________________________________________   EKG  Interpreted by me Normal sinus rhythm rate of 91, normal axis and intervals.  Poor R wave progression.  Normal ST segments and T waves.  No ischemic changes.  ____________________________________________    RADIOLOGY  DG Chest Port 1 View  Result Date: 11/28/2020 CLINICAL DATA:  Acute onset of shortness of breath, dizziness, generalized weakness and loss of taste that began earlier today. Current history of asthma and hypertension. EXAM: PORTABLE CHEST 1 VIEW COMPARISON:  11/09/2020 and earlier, including CT chest 03/08/2019. FINDINGS: Suboptimal inspiration accounts for crowded bronchovascular markings diffusely and atelectasis in the LEFT lung base, and accentuates the cardiac silhouette. Taking this into account, cardiac silhouette upper normal in size. Lungs otherwise  clear. No localized airspace consolidation. No pleural effusions. No pneumothorax. Normal pulmonary vascularity. IMPRESSION: Suboptimal inspiration accounts for mild LEFT basilar atelectasis. No acute cardiopulmonary disease otherwise. Electronically Signed   By: Evangeline Dakin M.D.   On: 11/28/2020 15:53    ____________________________________________   PROCEDURES .Critical Care Performed by: Carrie Mew, MD Authorized by: Carrie Mew, MD   Critical care provider statement:  Critical care time (minutes):  35   Critical care time was exclusive of:  Separately billable procedures and treating other patients   Critical care was necessary to treat or prevent imminent or life-threatening deterioration of the following conditions:  Dehydration and circulatory failure   Critical care was time spent personally by me on the following activities:  Development of treatment plan with patient or surrogate, discussions with consultants, evaluation of patient's response to treatment, examination of patient, obtaining history from patient or surrogate, ordering and performing treatments and interventions, ordering and review of laboratory studies, ordering and review of radiographic studies, pulse oximetry, re-evaluation of patient's condition and review of old charts    ____________________________________________  DIFFERENTIAL DIAGNOSIS   Dehydration, viral syndrome, electrolyte abnormality, pneumonia  CLINICAL IMPRESSION / ASSESSMENT AND PLAN / ED COURSE  Medications ordered in the ED: Medications  allopurinol (ZYLOPRIM) tablet 300 mg (has no administration in time range)  citalopram (CELEXA) tablet 20 mg (has no administration in time range)  pantoprazole (PROTONIX) EC tablet 40 mg (has no administration in time range)  folic acid (FOLVITE) tablet 1 mg (has no administration in time range)  ascorbic acid (VITAMIN C) tablet 1,000 mg (has no administration in time range)   thiamine tablet 100 mg (has no administration in time range)  mometasone-formoterol (DULERA) 100-5 MCG/ACT inhaler 2 puff (has no administration in time range)  albuterol (VENTOLIN HFA) 108 (90 Base) MCG/ACT inhaler 2 puff (has no administration in time range)  enoxaparin (LOVENOX) injection 40 mg (has no administration in time range)  0.9 %  sodium chloride infusion (has no administration in time range)  acetaminophen (TYLENOL) tablet 650 mg (has no administration in time range)    Or  acetaminophen (TYLENOL) suppository 650 mg (has no administration in time range)  traZODone (DESYREL) tablet 25 mg (has no administration in time range)  magnesium hydroxide (MILK OF MAGNESIA) suspension 30 mL (has no administration in time range)  ondansetron (ZOFRAN) tablet 4 mg (has no administration in time range)    Or  ondansetron (ZOFRAN) injection 4 mg (has no administration in time range)  aspirin EC tablet 81 mg (has no administration in time range)  multivitamin with minerals tablet 1 tablet (has no administration in time range)  sodium chloride 0.9 % bolus 1,000 mL (0 mLs Intravenous Stopped 11/28/20 1601)  lactated ringers bolus 1,000 mL (0 mLs Intravenous Stopped 11/28/20 1736)  sodium chloride 0.9 % bolus 1,000 mL (0 mLs Intravenous Stopped 11/28/20 1926)  cefTRIAXone (ROCEPHIN) 2 g in sodium chloride 0.9 % 100 mL IVPB (0 g Intravenous Stopped 11/28/20 1905)  dexamethasone (DECADRON) injection 10 mg (10 mg Intravenous Given 11/28/20 1924)    Pertinent labs & imaging results that were available during my care of the patient were reviewed by me and considered in my medical decision making (see chart for details).  Miguel [name] was evaluated in Emergency Department on 11/28/2020 for the symptoms described in the history of present illness. He was evaluated in the context of the global COVID-19 pandemic, which necessitated consideration that the patient might be at risk for infection with the SARS-CoV-2  virus that causes COVID-19. Institutional protocols and algorithms that pertain to the evaluation of patients at risk for COVID-19 are in a state of rapid change based on information released by regulatory bodies including the CDC and federal and state organizations. These policies and algorithms were followed during the patient's care in the ED.   Patient presents with malaise, fatigue, found  to have hypotension on arrival.  No focal symptoms to suggest infectious source.  Patient is nontoxic, not septic.  Broad work-up undertaken with lactate and blood cultures, lactate normal.  Will give 2 L IV fluid bolus and reassess.  Clinical Course as of 11/28/20 2034  Thu Nov 28, 2020  1818 Patient with persistent hypotension, orthostatic even after 2 L IV fluid bolus.  Standing blood pressure was 50/20.  We will continue IV fluids, give a dose of empiric Rocephin.  Procalcitonin is negative, white blood cell count is normal, no fever, no clear infectious source, doubt distributive shock, would not start pressors. [PS]    Clinical Course User Index [PS] Carrie Mew, MD    ----------------------------------------- 8:32 PM on 11/28/2020 -----------------------------------------  Blood pressure normalized after third liter IV fluids.  Patient still feeling unwell, case discussed with hospitalist for further management, overnight observation given persistent orthostatic symptoms. ____________________________________________   FINAL CLINICAL IMPRESSION(S) / ED DIAGNOSES    Final diagnoses:  Hypotension, unspecified hypotension type     ED Discharge Orders    None      Portions of this note were generated with dragon dictation software. Dictation errors may occur despite best attempts at proofreading.   Carrie Mew, MD 11/28/20 2034

## 2020-11-28 NOTE — ED Notes (Signed)
hospitalist at bedside

## 2020-11-29 DIAGNOSIS — I10 Essential (primary) hypertension: Secondary | ICD-10-CM

## 2020-11-29 DIAGNOSIS — F3289 Other specified depressive episodes: Secondary | ICD-10-CM

## 2020-11-29 DIAGNOSIS — F10239 Alcohol dependence with withdrawal, unspecified: Secondary | ICD-10-CM

## 2020-11-29 LAB — COMPREHENSIVE METABOLIC PANEL
ALT: 13 U/L (ref 0–44)
AST: 12 U/L — ABNORMAL LOW (ref 15–41)
Albumin: 3.2 g/dL — ABNORMAL LOW (ref 3.5–5.0)
Alkaline Phosphatase: 47 U/L (ref 38–126)
Anion gap: 9 (ref 5–15)
BUN: 18 mg/dL (ref 8–23)
CO2: 21 mmol/L — ABNORMAL LOW (ref 22–32)
Calcium: 8.1 mg/dL — ABNORMAL LOW (ref 8.9–10.3)
Chloride: 112 mmol/L — ABNORMAL HIGH (ref 98–111)
Creatinine, Ser: 1.09 mg/dL (ref 0.61–1.24)
GFR, Estimated: >60 mL/min (ref >60)
Glucose, Bld: 136 mg/dL — ABNORMAL HIGH (ref 70–99)
Potassium: 4.3 mmol/L (ref 3.5–5.1)
Sodium: 142 mmol/L (ref 135–145)
Total Bilirubin: 0.5 mg/dL (ref 0.3–1.2)
Total Protein: 5.9 g/dL — ABNORMAL LOW (ref 6.5–8.1)

## 2020-11-29 LAB — CBC
HCT: 33.8 % — ABNORMAL LOW (ref 39.0–52.0)
Hemoglobin: 10.6 g/dL — ABNORMAL LOW (ref 13.0–17.0)
MCH: 27.3 pg (ref 26.0–34.0)
MCHC: 31.4 g/dL (ref 30.0–36.0)
MCV: 87.1 fL (ref 80.0–100.0)
Platelets: 300 10*3/uL (ref 150–400)
RBC: 3.88 MIL/uL — ABNORMAL LOW (ref 4.22–5.81)
RDW: 17.4 % — ABNORMAL HIGH (ref 11.5–15.5)
WBC: 3 10*3/uL — ABNORMAL LOW (ref 4.0–10.5)
nRBC: 0 % (ref 0.0–0.2)

## 2020-11-29 LAB — PHOSPHORUS: Phosphorus: 2.7 mg/dL (ref 2.5–4.6)

## 2020-11-29 LAB — PROTIME-INR
INR: 1.1 (ref 0.8–1.2)
Prothrombin Time: 13.6 seconds (ref 11.4–15.2)

## 2020-11-29 LAB — MAGNESIUM: Magnesium: 1.5 mg/dL — ABNORMAL LOW (ref 1.7–2.4)

## 2020-11-29 LAB — LACTIC ACID, PLASMA: Lactic Acid, Venous: 1.4 mmol/L (ref 0.5–1.9)

## 2020-11-29 LAB — TSH: TSH: 0.052 u[IU]/mL — ABNORMAL LOW (ref 0.350–4.500)

## 2020-11-29 LAB — PROCALCITONIN: Procalcitonin: <0.1 ng/mL

## 2020-11-29 LAB — SARS CORONAVIRUS 2 (TAT 6-24 HRS): SARS Coronavirus 2: NEGATIVE

## 2020-11-29 LAB — HEMOGLOBIN A1C
Hgb A1c MFr Bld: 5.2 % (ref 4.8–5.6)
Mean Plasma Glucose: 102.54 mg/dL

## 2020-11-29 LAB — CORTISOL-AM, BLOOD: Cortisol - AM: 1.1 ug/dL — ABNORMAL LOW (ref 6.7–22.6)

## 2020-11-29 MED ORDER — THIAMINE HCL 100 MG PO TABS
100.0000 mg | ORAL_TABLET | Freq: Every day | ORAL | Status: DC
  Administered 2020-11-29 – 2020-11-30 (×2): 100 mg via ORAL
  Filled 2020-11-29 (×2): qty 1

## 2020-11-29 MED ORDER — LORAZEPAM 2 MG/ML IJ SOLN
1.0000 mg | INTRAMUSCULAR | Status: DC | PRN
  Administered 2020-11-29 (×2): 2 mg via INTRAVENOUS
  Filled 2020-11-29 (×2): qty 1

## 2020-11-29 MED ORDER — LORAZEPAM 1 MG PO TABS
1.0000 mg | ORAL_TABLET | ORAL | Status: DC | PRN
  Administered 2020-11-30 (×2): 1 mg via ORAL
  Filled 2020-11-29 (×2): qty 1

## 2020-11-29 MED ORDER — LISINOPRIL 20 MG PO TABS
40.0000 mg | ORAL_TABLET | Freq: Every day | ORAL | Status: DC
  Administered 2020-11-29 – 2020-11-30 (×2): 40 mg via ORAL
  Filled 2020-11-29: qty 2
  Filled 2020-11-29: qty 4

## 2020-11-29 MED ORDER — THIAMINE HCL 100 MG/ML IJ SOLN: 100.0000 mg | Freq: Every day | INTRAMUSCULAR | Status: DC

## 2020-11-29 MED ORDER — FLUTICASONE PROPIONATE 50 MCG/ACT NA SUSP
2.0000 | Freq: Every day | NASAL | Status: DC
  Administered 2020-11-29 – 2020-11-30 (×2): 2 via NASAL
  Filled 2020-11-29: qty 16

## 2020-11-29 NOTE — Evaluation (Signed)
Occupational Therapy Evaluation Patient Details Name: Miguel [name] MRN: [id number] DOB: 1961-04-24 Today's Date: 11/29/2020    History of Present Illness Miguel [name]  is a 60 y.o. Caucasian male with a known history of alcohol abuse, asthma, GERD, gout, hypertension and OCD, who presented to the ER with acute onset of generalized fatigue and malaise which have been going on for a week and got significantly worse today with associated nonproductive cough and nasal congestion, as well as loss of taste and smell.   Clinical Impression   Miguel [name] was seen for OT evaluation this date. Prior to hospital admission, pt was Independent in mobility and I/ADLs including driving and caring for wife with MS. Pt lives in apartment c ramp access. Upon arrival RN in room agreeable to mobility assessment. Pt presents to acute OT near functional baseline, currently SUPERVISION only for lines/leads mgmt toilet t/f and standing grooming tasks. MOD I Miguel [name]/doff B shoes seated EOB. BUE tremors noted, suspect r/t withdrawal symptoms. All education complete and no skilled acute OT needs identified, will sign off. Anticipate no OT follow up needed upon hospital discharge.      Follow Up Recommendations  No OT follow up;Supervision - Intermittent    Equipment Recommendations  None recommended by OT    Recommendations for Other Services       Precautions / Restrictions Precautions Precautions: Fall Precaution Comments: CIWA Restrictions Weight Bearing Restrictions: No      Mobility Bed Mobility Overal bed mobility: Modified Independent                  Transfers Overall transfer level: Needs assistance Equipment used: None Transfers: Sit to/from Stand Sit to Stand: Supervision         General transfer comment: SUP for lines/leads mgmt and tremors noted (suspect r/t withdrawls)    Balance Overall balance assessment: Needs assistance Sitting-balance support: No upper extremity supported;Feet  supported Sitting balance-Leahy Scale: Normal     Standing balance support: No upper extremity supported;During functional activity Standing balance-Leahy Scale: Good                             ADL either performed or assessed with clinical judgement   ADL Overall ADL's : Needs assistance/impaired                                       General ADL Comments: SUPERVISION grossly for lines/leads mgmt toilet t/f and standing grooming tasks. MOD I Miguel [name]/doff B shoes seated EOB.                  Pertinent Vitals/Pain Pain Assessment: No/denies pain     Hand Dominance     Extremity/Trunk Assessment Upper Extremity Assessment Upper Extremity Assessment: Overall WFL for tasks assessed   Lower Extremity Assessment Lower Extremity Assessment: Generalized weakness       Communication Communication Communication: No difficulties   Cognition Arousal/Alertness: Awake/alert Behavior During Therapy: WFL for tasks assessed/performed Overall Cognitive Status: Within Functional Limits for tasks assessed                                     General Comments  RN in room ok for OT to mobilize pt in room    Exercises Exercises: Other exercises Other Exercises  Other Exercises: Pt educated re: OT role, DME recs, d/c recs, falls prevention, ECS, safety precautions Other Exercises: LBD, toilet t/f, sup<>sit, sit<>stand, sitting/standing balance/tolerance, functional balance   Shoulder Instructions      Home Living Family/patient expects to be discharged to:: Private residence Living Arrangements: Spouse/significant other Available Help at Discharge: Family;Available PRN/intermittently Type of Home: Apartment Home Access: Ramped entrance     Home Layout: One level     Bathroom Shower/Tub: Tub/shower unit         Home Equipment: Environmental consultant - 2 wheels;Bedside commode;Transport chair (Wife's RW and transport chair)   Additional Comments:  Patient lives with his wife who he cares for. She has MS and has home health aide daily as well as intermittant help from 52 year old aunt who lives in [place] and cannot drive at night or in the rain.      Prior Functioning/Environment Level of Independence: Independent        Comments: Patient reports being fully independent with no AD. Drives and does own grocery shopping etc. Helps care for his wife with MS. She has home health aide 7 days a week and help from an elderly aunt.        OT Problem List: Decreased activity tolerance;Impaired balance (sitting and/or standing)         OT Goals(Current goals can be found in the care plan section) Acute Rehab OT Goals Patient Stated Goal: To return home to care for his wife OT Goal Formulation: With patient Time For Goal Achievement: 12/13/20 Potential to Achieve Goals: Good   AM-PAC OT "6 Clicks" Daily Activity     Outcome Measure Help from another person eating meals?: None Help from another person taking care of personal grooming?: A Little Help from another person toileting, which includes using toliet, bedpan, or urinal?: None Help from another person bathing (including washing, rinsing, drying)?: A Little Help from another person to put on and taking off regular upper body clothing?: None Help from another person to put on and taking off regular lower body clothing?: None 6 Click Score: 22   End of Session Nurse Communication: Mobility status  Activity Tolerance: Patient tolerated treatment well Patient left: in bed;with call bell/phone within reach;with nursing/sitter in room  OT Visit Diagnosis: Other abnormalities of gait and mobility (R26.89)                Time: 1275-1700 OT Time Calculation (min): 7 min Charges:  OT General Charges $OT Visit: 1 Visit OT Evaluation $OT Eval Low Complexity: 1 Low  Dessie Coma, M.S. OTR/L  11/29/20, 1:36 PM  ascom [phone]

## 2020-11-29 NOTE — ED Notes (Addendum)
Pt ambulating in room; appears restless and agitated -- reports he has upset stomach, nausea, tremors, cant sleep and feels diaphorectic; when asked if pt drinks alcohol patient states he does and states he drinks 1/2 pint daily - last drink 1 day ago.  Zofran IVP given with prn Maalox and prn Trazadone (see MAR) and on-call provider B Randol Kern, NP-C notified via secure chat of pt status and request for CIWA orders - awaiting response

## 2020-11-29 NOTE — Evaluation (Signed)
Physical Therapy Evaluation Patient Details Name: Miguel [name] MRN: [id number] DOB: August 10, 1961 Today's Date: 11/29/2020   History of Present Illness  Pt is a 60 y.o. caucasian male with a known history of alcohol abuse, asthma, GERD, gout, hypertension and OCD, who presented to the ER with acute onset of generalized fatigue and malaise which have been going on for a week and got significantly worse recently with associated nonproductive cough and nasal congestion, as well as loss of taste and smell.  MD assessment includes: orthostatic hypotension, resolved, lactic acidosis, sepsis, alcohol abuse, gout, and asthma.    Clinical Impression  Pt was pleasant and motivated to participate during the session and performed very well with all functional tasks. Pt was steady with transfers and gait without an AD and was able to amb 200+ feet with SpO2 and HR WNL.  Pt reported no adverse symptoms during the session and reported feeling that he was at his baseline functionally and did not require skilled PT services.  Will complete PT orders at this time but will reassess pt pending a change in status upon receipt of new PT orders.      Follow Up Recommendations No PT follow up    Equipment Recommendations  None recommended by PT    Recommendations for Other Services       Precautions / Restrictions Precautions Precautions: Fall Precaution Comments: CIWA Restrictions Weight Bearing Restrictions: No      Mobility  Bed Mobility Overal bed mobility: Independent             General bed mobility comments: Good speed and effort with all bed mob tasks    Transfers Overall transfer level: Independent Equipment used: None Transfers: Sit to/from Stand Sit to Stand: Independent         General transfer comment: Good eccentric/concentric control and stability  Ambulation/Gait Ambulation/Gait assistance: Independent Gait Distance (Feet): 200 Feet Assistive device: None;IV Pole Gait  Pattern/deviations: WFL(Within Functional Limits) Gait velocity: WNL   General Gait Details: Pt steady both with an IV pole and without support including during start/stops and sharp turns  Financial trader Rankin (Stroke Patients Only)       Balance Overall balance assessment: No apparent balance deficits (not formally assessed) Sitting-balance support: No upper extremity supported;Feet supported Sitting balance-Leahy Scale: Normal     Standing balance support: During functional activity;No upper extremity supported Standing balance-Leahy Scale: Normal                               Pertinent Vitals/Pain Pain Assessment: No/denies pain    Home Living Family/patient expects to be discharged to:: Private residence Living Arrangements: Spouse/significant other Available Help at Discharge: Family;Available PRN/intermittently Type of Home: Apartment Home Access: Ramped entrance     Home Layout: One level Home Equipment: Walker - 2 wheels;Bedside commode;Transport chair Additional Comments: Patient lives with his wife who he cares for. She has MS and has home health aide daily as well as intermittant help from 57 year old aunt who lives in [place] and cannot drive at night or in the rain.    Prior Function Level of Independence: Independent         Comments: Ind amb community distances without an AD, Ind with ADLs, no fall history     Hand Dominance        Extremity/Trunk Assessment  Upper Extremity Assessment Upper Extremity Assessment: Overall WFL for tasks assessed    Lower Extremity Assessment Lower Extremity Assessment: Overall WFL for tasks assessed       Communication   Communication: No difficulties  Cognition Arousal/Alertness: Awake/alert Behavior During Therapy: WFL for tasks assessed/performed Overall Cognitive Status: Within Functional Limits for tasks assessed                                         General Comments General comments (skin integrity, edema, etc.): RN in room ok for OT to mobilize pt in room    Exercises Other Exercises Other Exercises: Pt educated re: OT role, DME recs, d/c recs, falls prevention, ECS, safety precautions Other Exercises: LBD, toilet t/f, sup<>sit, sit<>stand, sitting/standing balance/tolerance, functional balance   Assessment/Plan    PT Assessment Patent does not need any further PT services  PT Problem List         PT Treatment Interventions      PT Goals (Current goals can be found in the Care Plan section)  Acute Rehab PT Goals Patient Stated Goal: To return home to care for his wife PT Goal Formulation: All assessment and education complete, DC therapy    Frequency     Barriers to discharge        Co-evaluation               AM-PAC PT "6 Clicks" Mobility  Outcome Measure Help needed turning from your back to your side while in a flat bed without using bedrails?: None Help needed moving from lying on your back to sitting on the side of a flat bed without using bedrails?: None Help needed moving to and from a bed to a chair (including a wheelchair)?: None Help needed standing up from a chair using your arms (e.g., wheelchair or bedside chair)?: None Help needed to walk in hospital room?: None Help needed climbing 3-5 steps with a railing? : None 6 Click Score: 24    End of Session Equipment Utilized During Treatment: Gait belt Activity Tolerance: Patient tolerated treatment well Patient left: in bed;with call bell/phone within reach Nurse Communication: Mobility status;Other (comment) (No bed alarm per patient request) PT Visit Diagnosis: Muscle weakness (generalized) (M62.81)    Time: 5537-4827 PT Time Calculation (min) (ACUTE ONLY): 29 min   Charges:   PT Evaluation $PT Eval Low Complexity: 1 Low         D. Scott Claudene Gatliff PT, DPT 11/29/20, 3:11 PM

## 2020-11-29 NOTE — Plan of Care (Signed)

## 2020-11-29 NOTE — Progress Notes (Signed)
PROGRESS NOTE    Miguel [name]  [id number] DOB: [birth year] DOA: 11/28/2020 PCP: Langston Reusing, NP  Assessment & Plan:   Active Problems:   Hypotension   Orthostatic hypotension:  Likely secondary to dehydration. Resolved   Lactic acidosis: repeat lactic acid level ordered. Continue on IVFs.   Sepsis r/o: tachycardia, hypotension & elevated lactic acid but no source of infection. Non-toxic appearing  Alcohol abuse: w/ possible w/drawal. Continue on CIWA protocol. Usually drink 1/2 pint of alcohol daily.   Hx of HTN: will restart home dose of lisinopril   Gout: continue on home dose of allopurinol   Depression: severity unknown. Continue on home dose of celexa   GERD: continue on PPI   Asthma: unknown stage/severity. Continue on bronchodilators    DVT prophylaxis: lovenox Code Status:  Full  Family Communication: Disposition Plan: likely d/c back home    Status is: Inpatient  Remains inpatient appropriate because:IV treatments appropriate due to intensity of illness or inability to take PO and Inpatient level of care appropriate due to severity of illness   Dispo: The patient is from: Home              Anticipated d/c is to: Home              Anticipated d/c date is: 3 days              Patient currently is not medically stable to d/c.     Consultants:      Procedures:    Antimicrobials:    Subjective: Pt c/o feeling "shaky."   Objective: Vitals:   11/29/20 0111 11/29/20 0114 11/29/20 0320 11/29/20 0700  BP:  133/83 (!) 170/94 (!) 169/89  Pulse: 94 92 99 99  Resp:  18 17 18   Temp:   99.1 F (37.3 C)   TempSrc:   Oral   SpO2:  95% 92% 93%  Weight:      Height:        Intake/Output Summary (Last 24 hours) at 11/29/2020 0814 Last data filed at 11/28/2020 1926 Gross per 24 hour  Intake -  Output 200 ml  Net -200 ml   Filed Weights   11/28/20 1435  Weight: 83.9 kg    Examination:  General exam: Appears  restless Respiratory system: Clear to auscultation. Respiratory effort normal. Cardiovascular system: S1 & S2 +. No  rubs, gallops or clicks.  Gastrointestinal system: Abdomen is nondistended, soft and nontender. Normal bowel sounds heard. Central nervous system: Alert and oriented. Moves all 4 extremities  Psychiatry: Judgement and insight appear normal. Tangential thinking    Data Reviewed: I have personally reviewed following labs and imaging studies  CBC: Recent Labs  Lab 11/28/20 1449 11/29/20 0357  WBC 6.8 3.0*  NEUTROABS 3.7  --   HGB 12.4* 10.6*  HCT 39.4 33.8*  MCV 87.2 87.1  PLT 352 [id number]   Basic Metabolic Panel: Recent Labs  Lab 11/28/20 1449 11/29/20 0357  NA 144 142  K 4.2 4.3  CL 111 112*  CO2 23 21*  GLUCOSE 112* 136*  BUN 22 18  CREATININE 1.11 1.09  CALCIUM 8.6* 8.1*  MG  --  1.5*  PHOS  --  2.7   GFR: Estimated Creatinine Clearance: 78.1 mL/min (by C-G formula based on SCr of 1.09 mg/dL). Liver Function Tests: Recent Labs  Lab 11/28/20 1449 11/29/20 0357  AST 12* 12*  ALT 14 13  ALKPHOS 54 47  BILITOT 0.5 0.5  PROT 7.0  5.9*  ALBUMIN 4.0 3.2*   No results for input(s): LIPASE, AMYLASE in the last 168 hours. No results for input(s): AMMONIA in the last 168 hours. Coagulation Profile: Recent Labs  Lab 11/29/20 0357  INR 1.1   Cardiac Enzymes: No results for input(s): CKTOTAL, CKMB, CKMBINDEX, TROPONINI in the last 168 hours. BNP (last 3 results) No results for input(s): PROBNP in the last 8760 hours. HbA1C: No results for input(s): HGBA1C in the last 72 hours. CBG: No results for input(s): GLUCAP in the last 168 hours. Lipid Profile: No results for input(s): CHOL, HDL, LDLCALC, TRIG, CHOLHDL, LDLDIRECT in the last 72 hours. Thyroid Function Tests: Recent Labs    11/29/20 0357  TSH 0.052*   Anemia Panel: No results for input(s): VITAMINB12, FOLATE, FERRITIN, TIBC, IRON, RETICCTPCT in the last 72 hours. Sepsis Labs: Recent Labs   Lab 11/28/20 1449 11/28/20 1707 11/28/20 1944 11/29/20 0357  PROCALCITON <0.10  --   --  <0.10  LATICACIDVEN 2.1* 3.2* 2.5*  --     Recent Results (from the past 240 hour(s))  Blood culture (routine x 2)     Status: None (Preliminary result)   Collection Time: 11/28/20  2:49 PM   Specimen: BLOOD  Result Value Ref Range Status   Specimen Description BLOOD RIGHT ANTECUBITAL  Final   Special Requests   Final    BOTTLES DRAWN AEROBIC AND ANAEROBIC Blood Culture adequate volume   Culture   Final    NO GROWTH < 24 HOURS Performed at [facility], [address] [place], [place] [address]    Report Status PENDING  Incomplete  Blood culture (routine x 2)     Status: None (Preliminary result)   Collection Time: 11/28/20  3:02 PM   Specimen: BLOOD LEFT HAND  Result Value Ref Range Status   Specimen Description BLOOD LEFT HAND  Final   Special Requests   Final    BOTTLES DRAWN AEROBIC AND ANAEROBIC Blood Culture adequate volume   Culture   Final    NO GROWTH < 24 HOURS Performed at [facility], [address], [place], [place] [address]    Report Status PENDING  Incomplete  SARS CORONAVIRUS 2 (TAT 6-24 HRS) Nasopharyngeal Nasopharyngeal Swab     Status: None   Collection Time: 11/28/20  4:08 PM   Specimen: Nasopharyngeal Swab  Result Value Ref Range Status   SARS Coronavirus 2 NEGATIVE NEGATIVE Final    Comment: (NOTE) SARS-CoV-2 target nucleic acids are NOT DETECTED.  The SARS-CoV-2 RNA is generally detectable in upper and lower respiratory specimens during the acute phase of infection. Negative results do not preclude SARS-CoV-2 infection, do not rule out co-infections with other pathogens, and should not be used as the sole basis for treatment or other patient management decisions. Negative results must be combined with clinical observations, patient history, and epidemiological information. The expected result is Negative.  Fact Sheet for  Patients: SugarRoll.be  Fact Sheet for Healthcare Providers: https://www.woods-mathews.com/  This test is not yet approved or cleared by the [place] FDA and  has been authorized for detection and/or diagnosis of SARS-CoV-2 by FDA under an Emergency Use Authorization (EUA). This EUA will remain  in effect (meaning this test can be used) for the duration of the COVID-19 declaration under Se ction 564(b)(1) of the Act, 21 U.S.C. section 360bbb-3(b)(1), unless the authorization is terminated or revoked sooner.  Performed at [facility], [place] [address], [place], [place] [address]  Radiology Studies: DG Chest Port 1 View  Result Date: 11/28/2020 CLINICAL DATA:  Acute onset of shortness of breath, dizziness, generalized weakness and loss of taste that began earlier today. Current history of asthma and hypertension. EXAM: PORTABLE CHEST 1 VIEW COMPARISON:  11/09/2020 and earlier, including CT chest 03/08/2019. FINDINGS: Suboptimal inspiration accounts for crowded bronchovascular markings diffusely and atelectasis in the LEFT lung base, and accentuates the cardiac silhouette. Taking this into account, cardiac silhouette upper normal in size. Lungs otherwise clear. No localized airspace consolidation. No pleural effusions. No pneumothorax. Normal pulmonary vascularity. IMPRESSION: Suboptimal inspiration accounts for mild LEFT basilar atelectasis. No acute cardiopulmonary disease otherwise. Electronically Signed   By: Evangeline Dakin M.D.   On: 11/28/2020 15:53        Scheduled Meds: . allopurinol  300 mg Oral Daily  . ascorbic acid  1,000 mg Oral Daily  . aspirin EC  81 mg Oral Daily  . citalopram  20 mg Oral Daily  . enoxaparin (LOVENOX) injection  40 mg Subcutaneous Q24H  . folic acid  1 mg Oral Daily  . mometasone-formoterol  2 puff Inhalation BID  . multivitamin with minerals  1 tablet Oral Daily  . pantoprazole  40 mg  Oral Daily  . thiamine  100 mg Oral Daily   Or  . thiamine  100 mg Intravenous Daily   Continuous Infusions: . sodium chloride 150 mL/hr at 11/28/20 2110     LOS: 1 day    Time spent: 35 mins     Wyvonnia Dusky, MD [facility] Pager [phone]  If 7PM-7AM, please contact night-coverage 11/29/2020, 8:14 AM

## 2020-11-29 NOTE — ED Notes (Signed)
Late entry - Pt now lying quietly in bed- no tremors noted at this time - reports stomach upset and nausea improving since Maalox and Zofran administered- no needs verbalized at this time

## 2020-11-30 DIAGNOSIS — F10230 Alcohol dependence with withdrawal, uncomplicated: Secondary | ICD-10-CM

## 2020-11-30 DIAGNOSIS — E876 Hypokalemia: Secondary | ICD-10-CM

## 2020-11-30 LAB — BASIC METABOLIC PANEL
Anion gap: 7 (ref 5–15)
BUN: 17 mg/dL (ref 8–23)
CO2: 23 mmol/L (ref 22–32)
Calcium: 8.3 mg/dL — ABNORMAL LOW (ref 8.9–10.3)
Chloride: 110 mmol/L (ref 98–111)
Creatinine, Ser: 1.03 mg/dL (ref 0.61–1.24)
GFR, Estimated: >60 mL/min (ref >60)
Glucose, Bld: 110 mg/dL — ABNORMAL HIGH (ref 70–99)
Potassium: 3.4 mmol/L — ABNORMAL LOW (ref 3.5–5.1)
Sodium: 140 mmol/L (ref 135–145)

## 2020-11-30 LAB — CBC
HCT: 34.4 % — ABNORMAL LOW (ref 39.0–52.0)
Hemoglobin: 11.3 g/dL — ABNORMAL LOW (ref 13.0–17.0)
MCH: 27.9 pg (ref 26.0–34.0)
MCHC: 32.8 g/dL (ref 30.0–36.0)
MCV: 84.9 fL (ref 80.0–100.0)
Platelets: 283 10*3/uL (ref 150–400)
RBC: 4.05 MIL/uL — ABNORMAL LOW (ref 4.22–5.81)
RDW: 16.8 % — ABNORMAL HIGH (ref 11.5–15.5)
WBC: 9.1 10*3/uL (ref 4.0–10.5)
nRBC: 0 % (ref 0.0–0.2)

## 2020-11-30 LAB — URINE CULTURE: Culture: NO GROWTH

## 2020-11-30 MED ORDER — CHLORDIAZEPOXIDE HCL 25 MG PO CAPS
25.0000 mg | ORAL_CAPSULE | Freq: Three times a day (TID) | ORAL | 0 refills | Status: AC | PRN
Start: 2020-11-30 — End: 2020-12-07

## 2020-11-30 MED ORDER — METOPROLOL TARTRATE 25 MG PO TABS: 25.0000 mg | ORAL_TABLET | Freq: Every day | ORAL | 0 refills | Status: DC

## 2020-11-30 MED ORDER — HYDRALAZINE HCL 50 MG PO TABS
100.0000 mg | ORAL_TABLET | Freq: Once | ORAL | Status: AC
  Administered 2020-11-30: 100 mg via ORAL
  Filled 2020-11-30: qty 2

## 2020-11-30 MED ORDER — POTASSIUM CHLORIDE CRYS ER 20 MEQ PO TBCR
20.0000 meq | EXTENDED_RELEASE_TABLET | Freq: Once | ORAL | Status: AC
  Administered 2020-11-30: 20 meq via ORAL
  Filled 2020-11-30: qty 1

## 2020-11-30 NOTE — Discharge Summary (Signed)
Physician Discharge Summary  Miguel [name] [id number] DOB: 1960-11-29 DOA: 11/28/2020  PCP: Langston Reusing, NP  Admit date: 11/28/2020 Discharge date: 11/30/2020  Admitted From: home Disposition: home  Recommendations for Outpatient Follow-up:  1. Follow up with PCP in 1 week  Home Health: no  Equipment/Devices:  Discharge Condition: stable  CODE STATUS: full  Diet recommendation: Heart Healthy  Brief/Interim Summary: HPI was taken from Dr. Sidney Ace: Miguel [name]  is a 60 y.o. Caucasian male with a known history of alcohol abuse, asthma, GERD, gout, hypertension and OCD, who presented to the ER with acute onset of generalized fatigue and malaise which have been going on for a week and got significantly worse today with associated nonproductive cough and nasal congestion, as well as loss of taste and smell.  He denied any nausea or vomiting however he had brief diarrhea. He admitted to chills but denied fever. He took about 20 doses of amoxicillin over the last week after a dentist visit for gum infection. He had 2 doses of Moderna COVID-19 vaccine.  No chest pain or palpitations.  No paresthesias or focal muscle weakness. He quit drinking alcohol 2 weeks ago and smoking cigarettes 30 years ago  Upon presentation to the ER, 19/78 and heart rate 114 and later blood pressure was down to 76/59 with a MAP of 66 and later 74/41 with a MAP of 52.  This improved after hydration to 115/79 with a heart rate of 76. His pulse oximetry is 99% on room air. He was significantly orthostatic in the ER. Blood pressure dropped from 88/61 on supine position to 58/27 on standing position. Labs revealed unremarkable CMP. High-sensitivity troponin I was three twice. Lactic acid was 2.1 and later 3.2 then 2.5. Procalcitonin was less than 0.1. CBC showed no significant abnormalities. Urinalysis showed hyaline casts. Blood cultures were drawn as well as urine culture. COVID-19 PCR is currently pending.  The patient  was given IV Rocephin and 10 mg IV Decadron, 1 L bolus of IV lactated Ringer and 2 L bolus of IV normal saline. He will be admitted to a medical monitored bed for further evaluation and management.  Hospital Course from Dr. Jimmye Norman 1/14-1/15/22: Pt presented w/ likely alcohol w/drawal, as pt usually drinks 1/2 pint of alcohol daily and stop drinking cold [place] a couple of days prior to admission. Pt was placed on CIWA protocol while inpatient and pt was d/c w/ po librium. Of note, pt was found to have uncontrolled HTN but initially presented w/ orthostatic hypotension that resolved prior to d/c. PT/OT evaluated the pt but did not recommend that pt need any further therapy.   Discharge Diagnoses:  Active Problems:   Hypotension   Orthostatic hypotension:  Likely secondary to dehydration. Resolved   Lactic acidosis: resolved   Sepsis r/o: tachycardia, hypotension & elevated lactic acid but no source of infection. Non-toxic appearing  Alcohol abuse: w/ possible w/drawal. Continue on CIWA protocol. Usually drink 1/2 pint of alcohol daily. Alcohol cessation counseling   HTN: will continue on home dose of lisinopril & start metoprolol  Hypokalemia: KCl repleted. Will continue to monitor   Gout: continue on home dose of allopurinol   Depression: severity unknown. Continue on home dose of celexa   GERD: continue on PPI   Asthma: unknown stage/severity. Continue on bronchodilators  Discharge Instructions  Discharge Instructions    Diet - low sodium heart healthy   Complete by: As directed    Discharge instructions   Complete by: As  directed    F/u w/ PCP in 1 week   Increase activity slowly   Complete by: As directed      Allergies as of 11/30/2020      Reactions   Percocet [oxycodone-acetaminophen] Nausea And Vomiting      Medication List    TAKE these medications   allopurinol 300 MG tablet Commonly known as: ZYLOPRIM TAKE ONE TABLET BY MOUTH EVERY DAY   ascorbic acid  500 MG tablet Commonly known as: VITAMIN C Take by mouth.   chlordiazePOXIDE 25 MG capsule Commonly known as: LIBRIUM Take 1 capsule (25 mg total) by mouth 3 (three) times daily as needed for up to 7 days for anxiety or withdrawal.   citalopram 20 MG tablet Commonly known as: CeleXA Take 1 tablet (20 mg total) by mouth daily.   Fluticasone-Salmeterol 100-50 MCG/DOSE Aepb Commonly known as: Advair Diskus Inhale 1 puff into the lungs 2 (two) times daily.   folic acid 1 MG tablet Commonly known as: FOLVITE Take 1 tablet (1 mg total) by mouth daily.   lisinopril 40 MG tablet Commonly known as: ZESTRIL TAKE ONE TABLET BY MOUTH EVERY DAY   metoprolol tartrate 25 MG tablet Commonly known as: LOPRESSOR Take 1 tablet (25 mg total) by mouth daily.   pantoprazole 40 MG tablet Commonly known as: PROTONIX Take 1 tablet (40 mg total) by mouth daily.   thiamine 100 MG tablet Take 1 tablet (100 mg total) by mouth daily.   Ventolin HFA 108 (90 Base) MCG/ACT inhaler Generic drug: albuterol INHALE 2 PUFFS EVERY 4 HOURS AS NEEDED       Allergies  Allergen Reactions  . Percocet [Oxycodone-Acetaminophen] Nausea And Vomiting    Consultations:     Procedures/Studies: DG Shoulder Right  Result Date: 11/09/2020 CLINICAL DATA:  RIGHT shoulder pain post fall EXAM: RIGHT SHOULDER - 2+ VIEW COMPARISON:  None FINDINGS: Osseous mineralization decreased. AC joint alignment normal. Visualized RIGHT ribs intact. No acute fracture, dislocation, or bone destruction. IMPRESSION: No acute abnormalities. Electronically Signed   By: Lavonia Dana M.D.   On: 11/09/2020 17:00   DG Chest Port 1 View  Result Date: 11/28/2020 CLINICAL DATA:  Acute onset of shortness of breath, dizziness, generalized weakness and loss of taste that began earlier today. Current history of asthma and hypertension. EXAM: PORTABLE CHEST 1 VIEW COMPARISON:  11/09/2020 and earlier, including CT chest 03/08/2019. FINDINGS:  Suboptimal inspiration accounts for crowded bronchovascular markings diffusely and atelectasis in the LEFT lung base, and accentuates the cardiac silhouette. Taking this into account, cardiac silhouette upper normal in size. Lungs otherwise clear. No localized airspace consolidation. No pleural effusions. No pneumothorax. Normal pulmonary vascularity. IMPRESSION: Suboptimal inspiration accounts for mild LEFT basilar atelectasis. No acute cardiopulmonary disease otherwise. Electronically Signed   By: Evangeline Dakin M.D.   On: 11/28/2020 15:53   DG Chest Port 1 View  Result Date: 11/09/2020 CLINICAL DATA:  BILATERAL shoulder pain post fall, asthma, hypertension, former smoker EXAM: PORTABLE CHEST 1 VIEW COMPARISON:  Portable exam 1640 hours compared to 10/18/2020 FINDINGS: Normal heart size, mediastinal contours, and pulmonary vascularity. Minimal bibasilar atelectasis. Lungs otherwise clear. No infiltrate, pleural effusion or pneumothorax. No acute osseous findings. IMPRESSION: Minimal bibasilar atelectasis. Electronically Signed   By: Lavonia Dana M.D.   On: 11/09/2020 16:59   DG Shoulder Left  Result Date: 11/09/2020 CLINICAL DATA:  Pain post fall EXAM: LEFT SHOULDER - 2+ VIEW COMPARISON:  None FINDINGS: Osseous demineralization. AC joint alignment normal. Visualized LEFT ribs  intact. No fracture, dislocation, or bone destruction. IMPRESSION: No acute abnormalities. Electronically Signed   By: Lavonia Dana M.D.   On: 11/09/2020 16:58        Subjective: Pt c/o fatigue   Discharge Exam: Vitals:   11/30/20 1207 11/30/20 1333  BP: (!) 173/101 (!) 151/75  Pulse: 96 (!) 115  Resp: 14   Temp: 97.7 F (36.5 C)   SpO2: 96%    Vitals:   11/30/20 0811 11/30/20 1103 11/30/20 1207 11/30/20 1333  BP: (!) 184/103 (!) 177/106 (!) 173/101 (!) 151/75  Pulse: 87 96 96 (!) 115  Resp: 18  14   Temp: 98.1 F (36.7 C)  97.7 F (36.5 C)   TempSrc: Oral  Oral   SpO2: 95%  96%   Weight:      Height:         General: Pt is alert, awake, not in acute distress Cardiovascular: S1/S2 +, no rubs, no gallops Respiratory: CTA bilaterally, no wheezing, no rhonchi Abdominal: Soft, NT, ND, bowel sounds + Extremities:no cyanosis    The results of significant diagnostics from this hospitalization (including imaging, microbiology, ancillary and laboratory) are listed below for reference.     Microbiology: Recent Results (from the past 240 hour(s))  Blood culture (routine x 2)     Status: None (Preliminary result)   Collection Time: 11/28/20  2:49 PM   Specimen: BLOOD  Result Value Ref Range Status   Specimen Description BLOOD RIGHT ANTECUBITAL  Final   Special Requests   Final    BOTTLES DRAWN AEROBIC AND ANAEROBIC Blood Culture adequate volume   Culture   Final    NO GROWTH 2 DAYS Performed at [facility], [address] [place], [place] [address]    Report Status PENDING  Incomplete  Blood culture (routine x 2)     Status: None (Preliminary result)   Collection Time: 11/28/20  3:02 PM   Specimen: BLOOD LEFT HAND  Result Value Ref Range Status   Specimen Description BLOOD LEFT HAND  Final   Special Requests   Final    BOTTLES DRAWN AEROBIC AND ANAEROBIC Blood Culture adequate volume   Culture   Final    NO GROWTH 2 DAYS Performed at [facility], [address] [place], [place] [address]    Report Status PENDING  Incomplete  SARS CORONAVIRUS 2 (TAT 6-24 HRS) Nasopharyngeal Nasopharyngeal Swab     Status: None   Collection Time: 11/28/20  4:08 PM   Specimen: Nasopharyngeal Swab  Result Value Ref Range Status   SARS Coronavirus 2 NEGATIVE NEGATIVE Final    Comment: (NOTE) SARS-CoV-2 target nucleic acids are NOT DETECTED.  The SARS-CoV-2 RNA is generally detectable in upper and lower respiratory specimens during the acute phase of infection. Negative results do not preclude SARS-CoV-2 infection, do not rule out co-infections with other pathogens, and should  not be used as the sole basis for treatment or other patient management decisions. Negative results must be combined with clinical observations, patient history, and epidemiological information. The expected result is Negative.  Fact Sheet for Patients: SugarRoll.be  Fact Sheet for Healthcare Providers: https://www.woods-mathews.com/  This test is not yet approved or cleared by the [place] FDA and  has been authorized for detection and/or diagnosis of SARS-CoV-2 by FDA under an Emergency Use Authorization (EUA). This EUA will remain  in effect (meaning this test can be used) for the duration of the COVID-19 declaration under Se ction 564(b)(1) of the Act,  21 U.S.C. section 360bbb-3(b)(1), unless the authorization is terminated or revoked sooner.  Performed at [facility], [place] [address], [place], [place] [address]   Urine culture     Status: None   Collection Time: 11/28/20  6:34 PM   Specimen: In/Out Cath Urine  Result Value Ref Range Status   Specimen Description   Final    IN/OUT CATH URINE Performed at [facility], [address], [place], [place] [address]    Special Requests   Final    NONE Performed at [facility], [address] [place], [place] [address]    Culture   Final    NO GROWTH Performed at [facility], [place] [address], [place], [place] [address]    Report Status 11/30/2020 FINAL  Final     Labs: BNP (last 3 results) Recent Labs    08/14/20 1618  BNP 0000000   Basic Metabolic Panel: Recent Labs  Lab 11/28/20 1449 11/29/20 0357 11/30/20 0549  NA 144 142 140  K 4.2 4.3 3.4*  CL 111 112* 110  CO2 23 21* 23  GLUCOSE 112* 136* 110*  BUN 22 18 17   CREATININE 1.11 1.09 1.03  CALCIUM 8.6* 8.1* 8.3*  MG  --  1.5*  --   PHOS  --  2.7  --    Liver Function Tests: Recent Labs  Lab 11/28/20 1449 11/29/20 0357  AST 12* 12*  ALT 14 13  ALKPHOS 54 47  BILITOT 0.5  0.5  PROT 7.0 5.9*  ALBUMIN 4.0 3.2*   No results for input(s): LIPASE, AMYLASE in the last 168 hours. No results for input(s): AMMONIA in the last 168 hours. CBC: Recent Labs  Lab 11/28/20 1449 11/29/20 0357 11/30/20 0549  WBC 6.8 3.0* 9.1  NEUTROABS 3.7  --   --   HGB 12.4* 10.6* 11.3*  HCT 39.4 33.8* 34.4*  MCV 87.2 87.1 84.9  PLT 352 300 283   Cardiac Enzymes: No results for input(s): CKTOTAL, CKMB, CKMBINDEX, TROPONINI in the last 168 hours. BNP: Invalid input(s): POCBNP CBG: No results for input(s): GLUCAP in the last 168 hours. D-Dimer No results for input(s): DDIMER in the last 72 hours. Hgb A1c Recent Labs    11/29/20 0357  HGBA1C 5.2   Lipid Profile No results for input(s): CHOL, HDL, LDLCALC, TRIG, CHOLHDL, LDLDIRECT in the last 72 hours. Thyroid function studies Recent Labs    11/29/20 0357  TSH 0.052*   Anemia work up No results for input(s): VITAMINB12, FOLATE, FERRITIN, TIBC, IRON, RETICCTPCT in the last 72 hours. Urinalysis    Component Value Date/Time   COLORURINE YELLOW (A) 11/28/2020 1834   APPEARANCEUR CLEAR (A) 11/28/2020 1834   APPEARANCEUR Hazy 06/26/2014 2238   LABSPEC 1.014 11/28/2020 1834   LABSPEC 1.036 06/26/2014 2238   PHURINE 6.0 11/28/2020 1834   GLUCOSEU NEGATIVE 11/28/2020 1834   GLUCOSEU Negative 06/26/2014 2238   HGBUR NEGATIVE 11/28/2020 1834   BILIRUBINUR NEGATIVE 11/28/2020 1834   BILIRUBINUR 1+ 06/26/2014 2238   KETONESUR NEGATIVE 11/28/2020 1834   PROTEINUR NEGATIVE 11/28/2020 1834   NITRITE NEGATIVE 11/28/2020 1834   LEUKOCYTESUR NEGATIVE 11/28/2020 1834   LEUKOCYTESUR 1+ 06/26/2014 2238   Sepsis Labs Invalid input(s): PROCALCITONIN,  WBC,  LACTICIDVEN Microbiology Recent Results (from the past 240 hour(s))  Blood culture (routine x 2)     Status: None (Preliminary result)   Collection Time: 11/28/20  2:49 PM   Specimen: BLOOD  Result Value Ref Range Status  Specimen Description BLOOD RIGHT ANTECUBITAL   Final   Special Requests   Final    BOTTLES DRAWN AEROBIC AND ANAEROBIC Blood Culture adequate volume   Culture   Final    NO GROWTH 2 DAYS Performed at [facility], [address] [place], [place] [address]    Report Status PENDING  Incomplete  Blood culture (routine x 2)     Status: None (Preliminary result)   Collection Time: 11/28/20  3:02 PM   Specimen: BLOOD LEFT HAND  Result Value Ref Range Status   Specimen Description BLOOD LEFT HAND  Final   Special Requests   Final    BOTTLES DRAWN AEROBIC AND ANAEROBIC Blood Culture adequate volume   Culture   Final    NO GROWTH 2 DAYS Performed at [facility], [address] [place], [place] [address]    Report Status PENDING  Incomplete  SARS CORONAVIRUS 2 (TAT 6-24 HRS) Nasopharyngeal Nasopharyngeal Swab     Status: None   Collection Time: 11/28/20  4:08 PM   Specimen: Nasopharyngeal Swab  Result Value Ref Range Status   SARS Coronavirus 2 NEGATIVE NEGATIVE Final    Comment: (NOTE) SARS-CoV-2 target nucleic acids are NOT DETECTED.  The SARS-CoV-2 RNA is generally detectable in upper and lower respiratory specimens during the acute phase of infection. Negative results do not preclude SARS-CoV-2 infection, do not rule out co-infections with other pathogens, and should not be used as the sole basis for treatment or other patient management decisions. Negative results must be combined with clinical observations, patient history, and epidemiological information. The expected result is Negative.  Fact Sheet for Patients: SugarRoll.be  Fact Sheet for Healthcare Providers: https://www.woods-mathews.com/  This test is not yet approved or cleared by the [place] FDA and  has been authorized for detection and/or diagnosis of SARS-CoV-2 by FDA under an Emergency Use Authorization (EUA). This EUA will remain  in effect (meaning this test can be used) for the duration  of the COVID-19 declaration under Se ction 564(b)(1) of the Act, 21 U.S.C. section 360bbb-3(b)(1), unless the authorization is terminated or revoked sooner.  Performed at [facility], [place] [address], [place], [place] [address]   Urine culture     Status: None   Collection Time: 11/28/20  6:34 PM   Specimen: In/Out Cath Urine  Result Value Ref Range Status   Specimen Description   Final    IN/OUT CATH URINE Performed at [facility], [address] [place], [place] [address]    Special Requests   Final    NONE Performed at [facility], [address], [place], [place] [address]    Culture   Final    NO GROWTH Performed at [facility], [place] [address] [place], [place] [address]    Report Status 11/30/2020 FINAL  Final     Time coordinating discharge: Over 30 minutes  SIGNED:   Wyvonnia Dusky, MD  [facility] 11/30/2020, 2:41 PM Pager   If 7PM-7AM, please contact night-coverage

## 2020-12-02 IMAGING — DX DG CHEST 1V PORT
1 series · 1 of 1 positions shown · non-contrast
Comparison: Chest radiograph dated 02/15/2020.

CLINICAL DATA: 58-year-old male with cough

EXAM:
PORTABLE CHEST 1 VIEW

[chest ap]
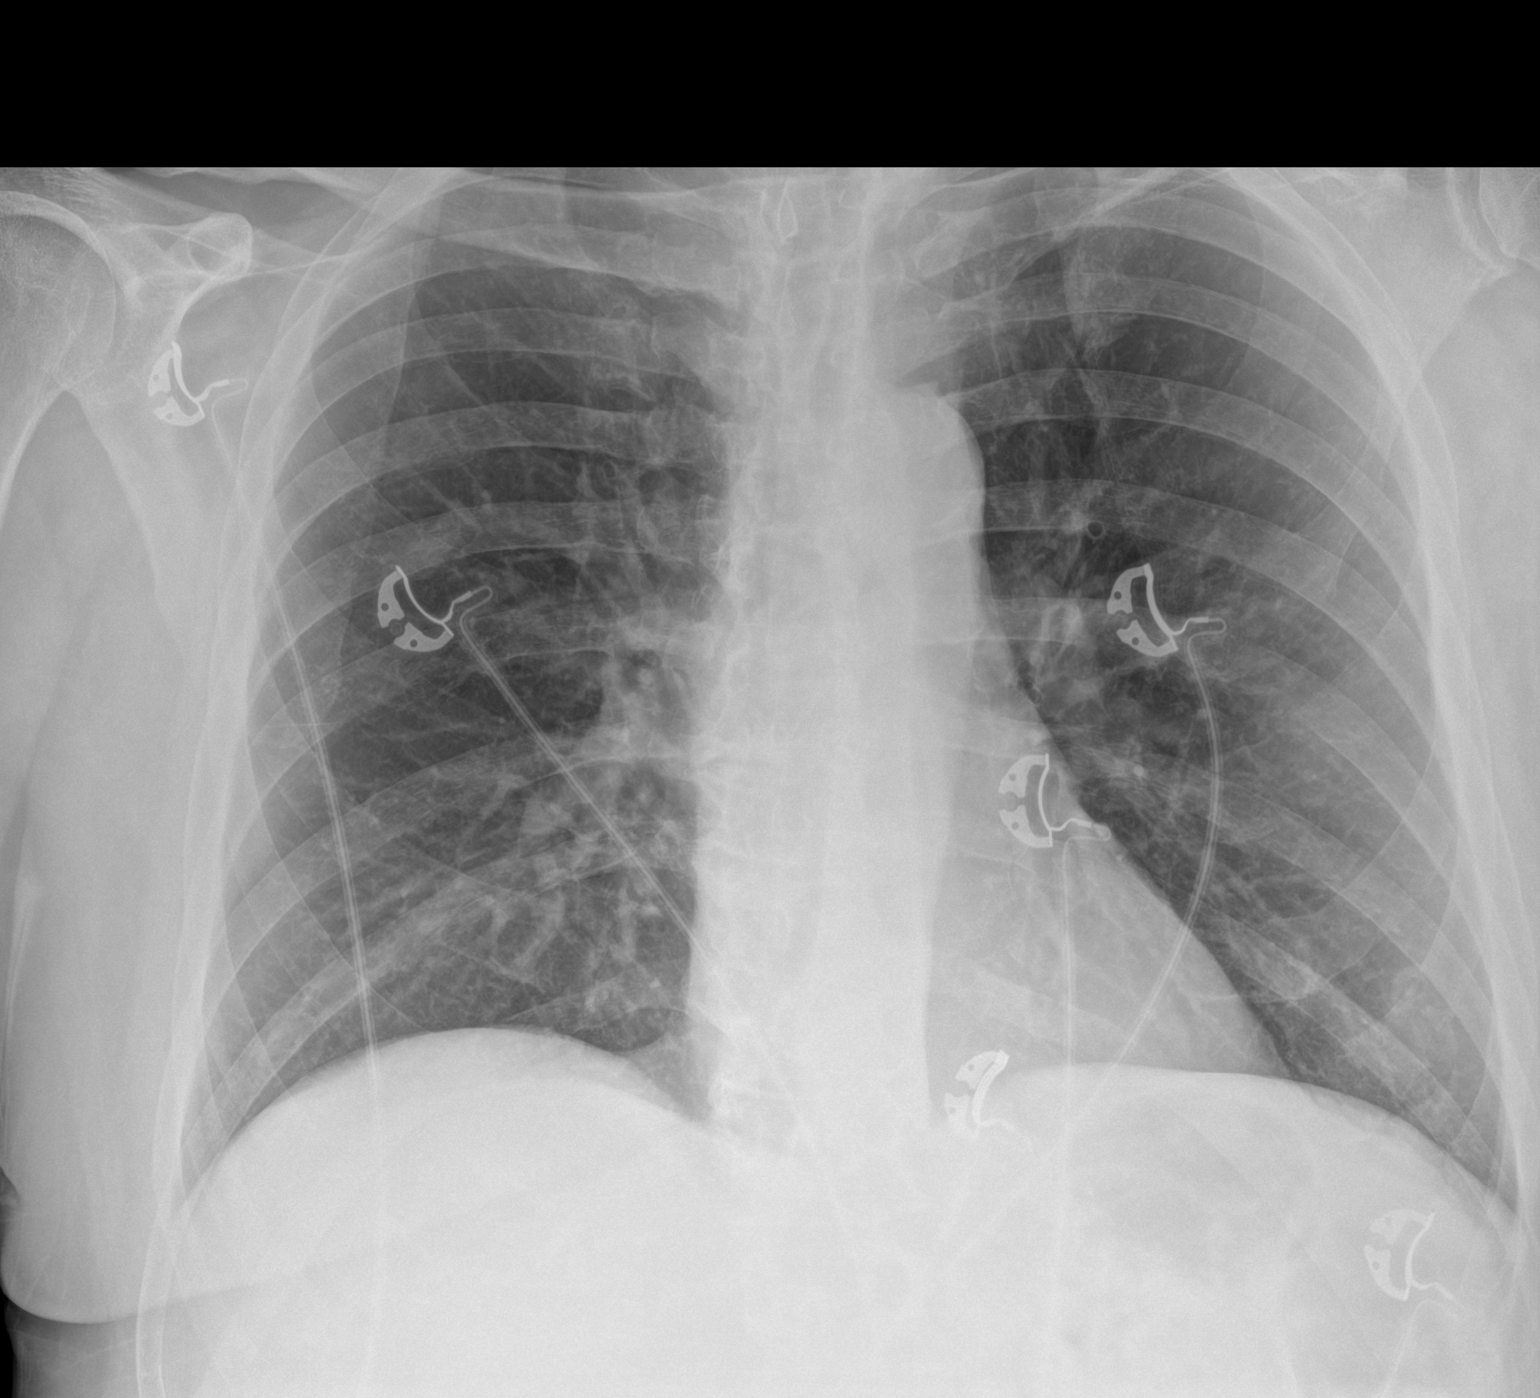

[1 of 1 positions shown; findings below may reference images not displayed]

FINDINGS: Minimal peribronchial densities may represent reactive small airway
disease versus viral infection. Clinical correlation is recommended
no focal consolidation, pleural effusion, or pneumothorax. The
cardiac silhouette is within normal limits. Atherosclerotic
calcification of the aortic arch. Old left anterior rib fracture
deformity. No acute osseous pathology.
IMPRESSION: No focal consolidation.

## 2020-12-02 IMAGING — CT CT ABD-PELV W/ CM
2 of 5 series · 16 of 46 positions shown, 18 images · IV contrast (APPLIED)
Comparison: 11/13/2019

CLINICAL DATA: Nausea, vomiting, abdominal distention

EXAM:
CT ABDOMEN AND PELVIS WITH CONTRAST
TECHNIQUE: Multidetector CT imaging of the abdomen and pelvis was performed
using the standard protocol following bolus administration of
intravenous contrast.
CONTRAST:  100mL OMNIPAQUE IOHEXOL 300 MG/ML  SOLN

[Series 2: routine abd/pel with · axial · 0.78mm/px · z∈[-1011,-531]mm · 13 of 109 slices shown, 15 images]
[im 7/109  soft-tissue]
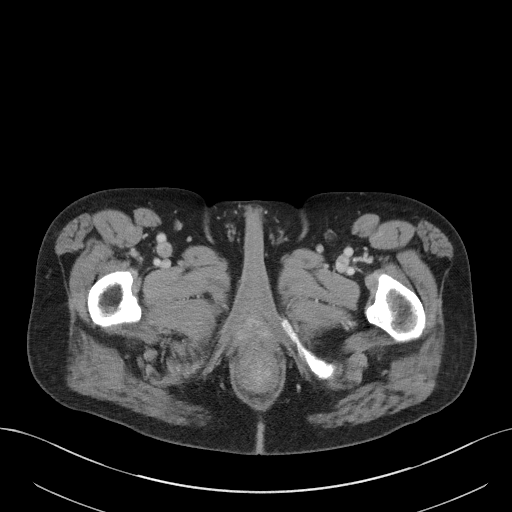
[im 7/109  bone]
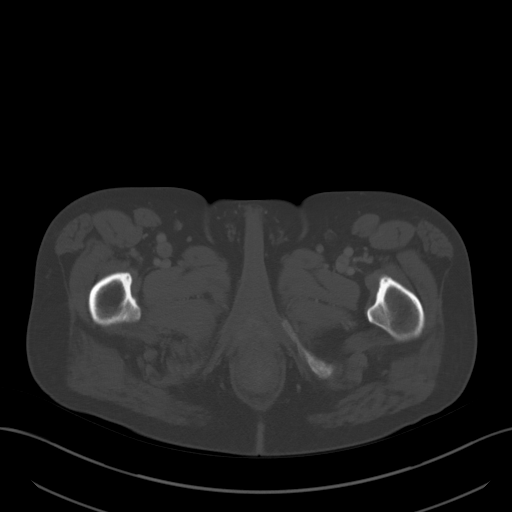
[im 13/109  soft-tissue]
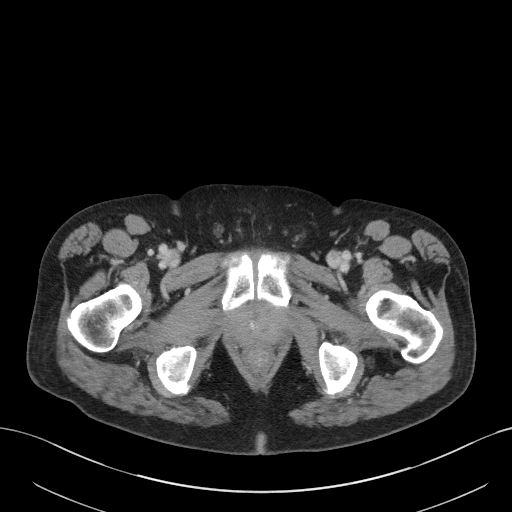
[im 25/109  soft-tissue]
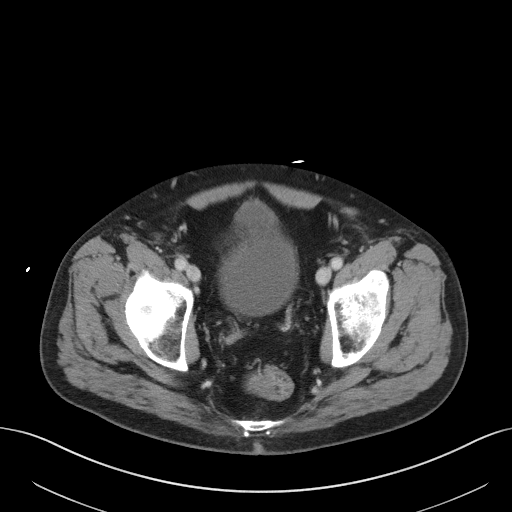
[im 31/109  soft-tissue]
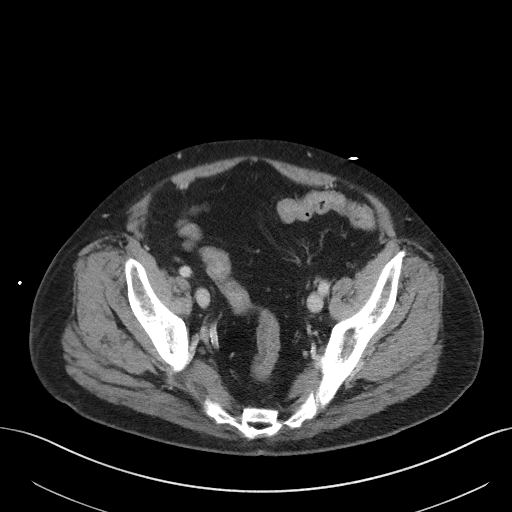
[im 37/109  soft-tissue]
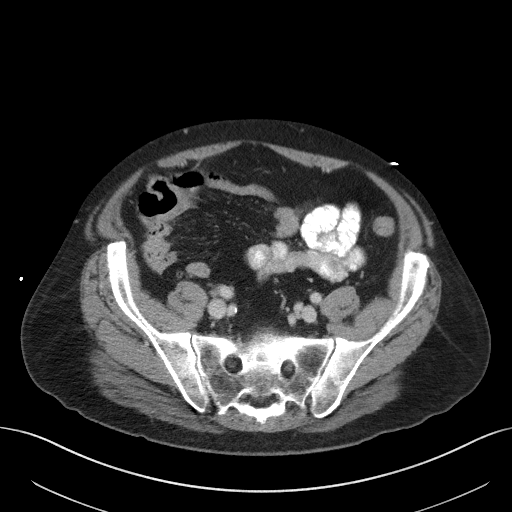
[im 49/109  soft-tissue]
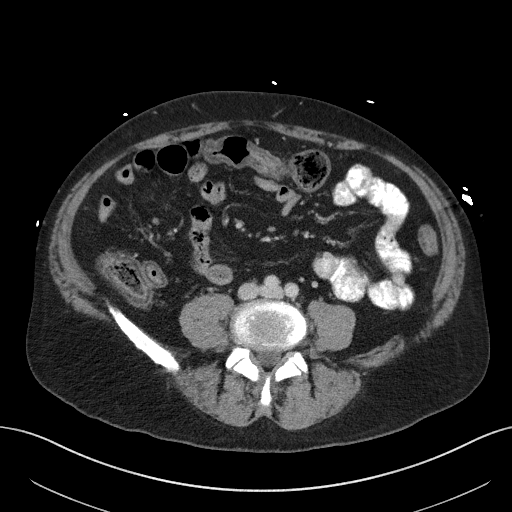
[im 55/109  soft-tissue]
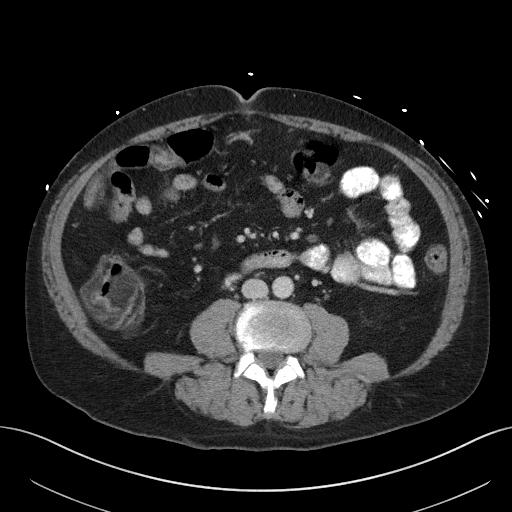
[im 61/109  soft-tissue]
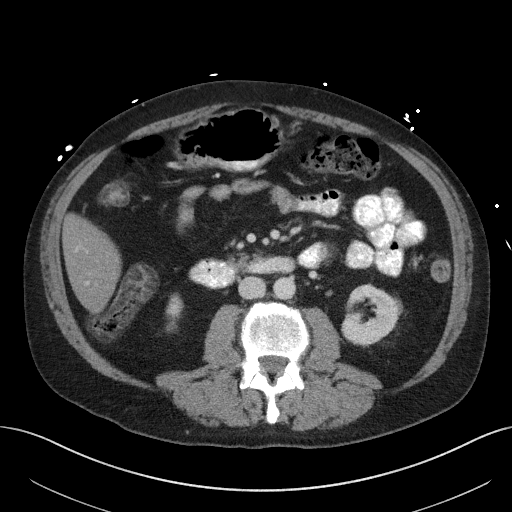
[im 73/109  soft-tissue]
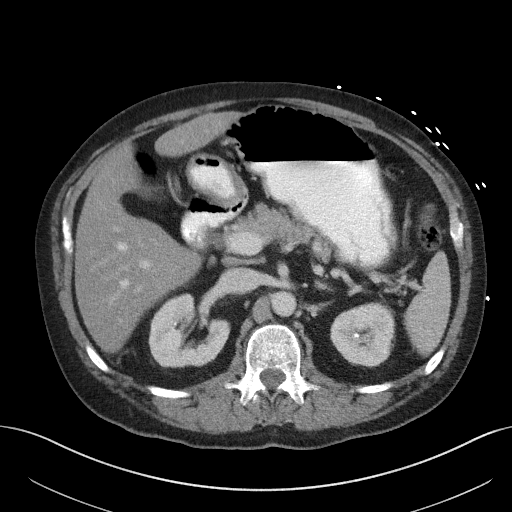
[im 73/109  bone]
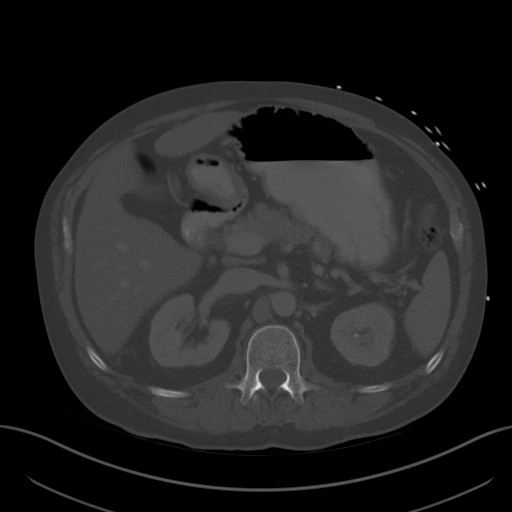
[im 79/109  soft-tissue]
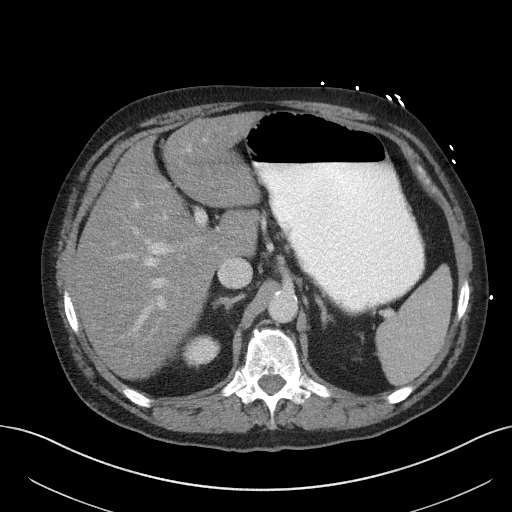
[im 85/109  soft-tissue]
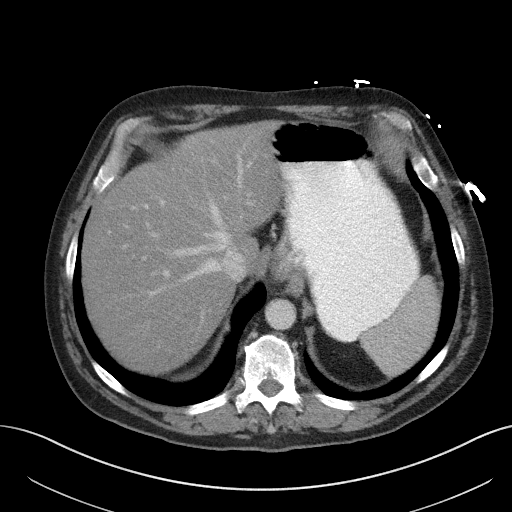
[im 97/109  soft-tissue]
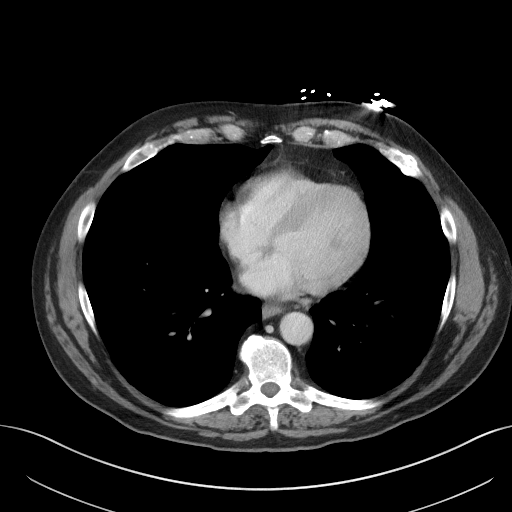
[im 103/109  soft-tissue]
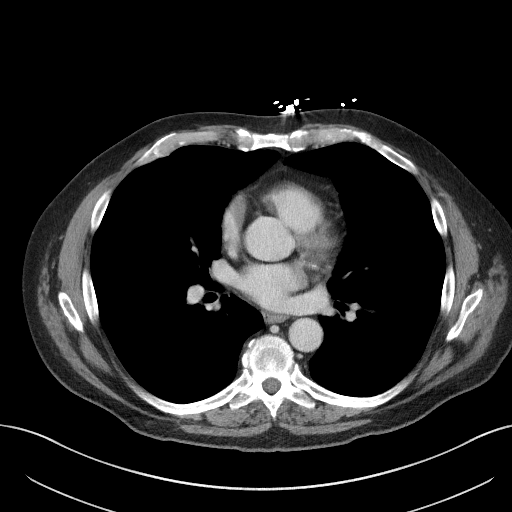

[Series 5: coronal st · coronal · 0.71mm/px · 3 of 94 slices shown]
[im 32/94  soft-tissue]
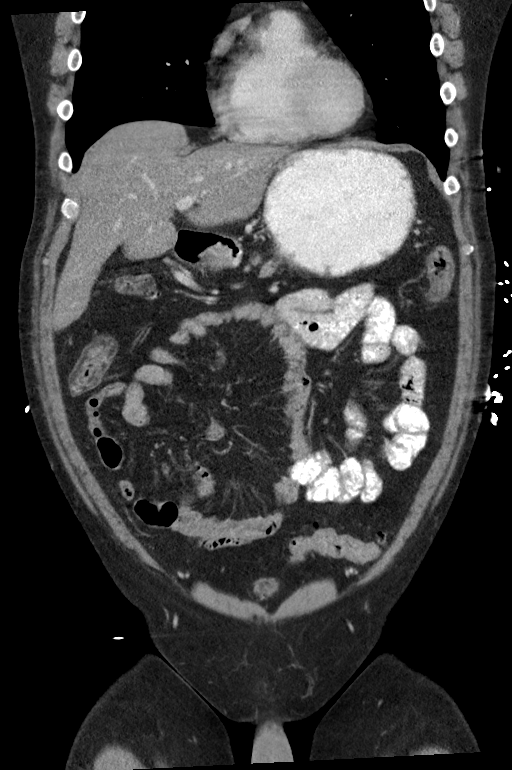
[im 42/94  soft-tissue]
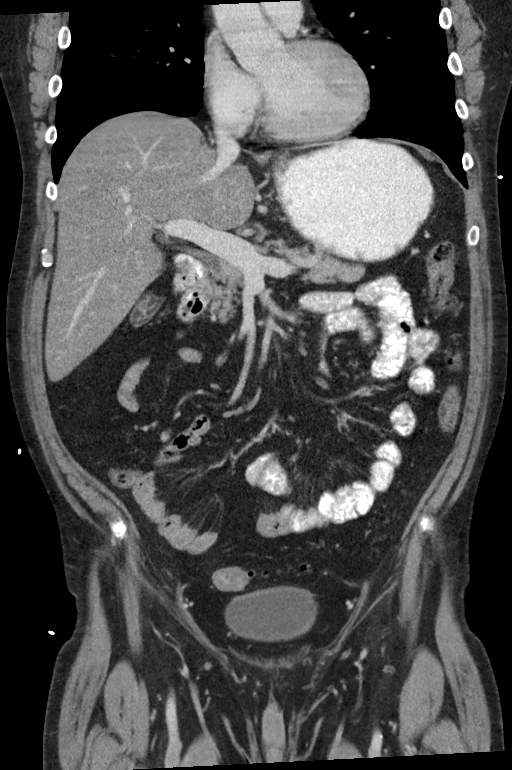
[im 52/94  soft-tissue]
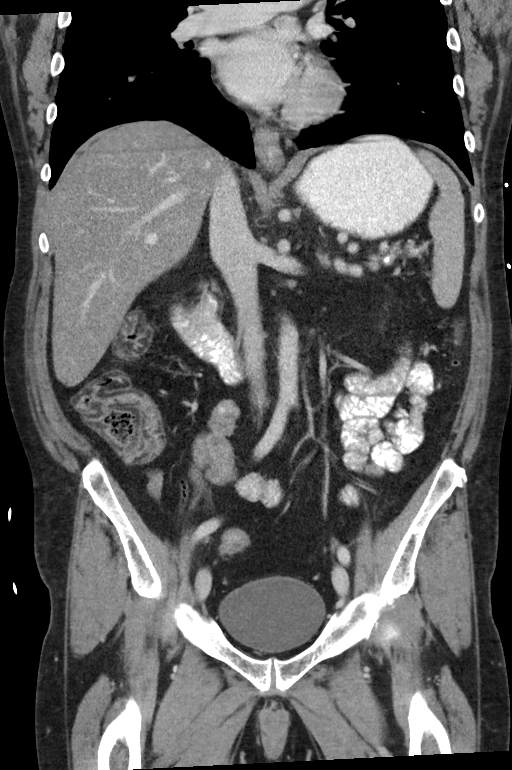

[16 of 46 positions shown; findings below may reference images not displayed]

FINDINGS: Lower chest: Lung bases are clear. No effusions. Heart is normal
size.

Hepatobiliary: Prior cholecystectomy. Diffuse fatty infiltration of
the liver. No focal hepatic abnormality.

Pancreas: No focal abnormality or ductal dilatation.

Spleen: No focal abnormality.  Normal size.

Adrenals/Urinary Tract: Bilateral nonobstructing renal stones. No
ureteral stones or hydronephrosis. Urinary bladder and adrenal
glands unremarkable.

Stomach/Bowel: Normal appendix. Sigmoid diverticulosis. No active
diverticulitis. Mild wall thickening in the cecum and ascending
colon with mural stratification which could be related to old
inflammatory bowel disease or liver disease. No acute inflammatory
process.

Vascular/Lymphatic: No evidence of aneurysm or adenopathy.

Reproductive: Prostate enlargement with central calcifications.

Other: No free fluid or free air.

Musculoskeletal: No acute bony abnormality.
IMPRESSION: Diffuse fatty infiltration of the liver.

Bilateral nephrolithiasis.  No ureteral stones or hydronephrosis.

Sigmoid diverticulosis.

No acute findings in the abdomen or pelvis.

## 2020-12-03 ENCOUNTER — Telehealth: Payer: Medicaid Other | Admitting: Gerontology

## 2020-12-03 ENCOUNTER — Other Ambulatory Visit: Payer: Self-pay

## 2020-12-03 DIAGNOSIS — I158 Other secondary hypertension: Secondary | ICD-10-CM

## 2020-12-03 DIAGNOSIS — Z09 Encounter for follow-up examination after completed treatment for conditions other than malignant neoplasm: Secondary | ICD-10-CM

## 2020-12-03 LAB — CULTURE, BLOOD (ROUTINE X 2)
Culture: NO GROWTH
Culture: NO GROWTH
Special Requests: ADEQUATE
Special Requests: ADEQUATE

## 2020-12-03 NOTE — Progress Notes (Signed)
Established Patient Office Visit  Subjective:  Patient ID: Miguel [name], male    DOB: May 04, 1961  Age: 60 y.o. MRN: [id number]  CC: No chief complaint on file. Patient consents to telephone visit and 2 patient identifiers was used to identify patient.  HPI Miguel [name] presents for follow up after hospital discharge on 11/30/2020. He has a history of Alcohol abuse, Asthma, Gerd, Gout, hypertension and OCD. During his hospital course, he was treated for likely alcohol withdrawal and hypotension. Currently, he states that he's doing well. He denies alcohol consumption and states that [place] Year's eve was his last alcoholic drink. He states that he's compliant with his medications and continues to make healthy life style modification. He states that his mood is good, denies suicidal nor homicidal ideation and has not scheduled an appointment with [facility]. He states that his blood pressure was 172/70 prior few minutes before his telephone visit. He states that he was working in his yard prior to checking blood pressure. He denies chest pain, palpitation, headache, vision changes and light headedness. Overall, he states that he's improving daily and offers no further complaint.  Past Medical History:  Diagnosis Date  . Alcohol abuse   . Anxiety   . Asthma   . Family history of breast cancer   . GERD (gastroesophageal reflux disease)   . Gout   . Hx of colonic polyps   . Hypertension   . Kidney stone   . 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 OCD (obsessive compulsive disorder)   . Renal colic     Past Surgical History:  Procedure Laterality Date  . CHOLECYSTECTOMY  2012  . COLONOSCOPY    . COLONOSCOPY WITH PROPOFOL N/A 05/28/2020   Procedure: COLONOSCOPY WITH PROPOFOL;  Surgeon: [name], MD;  Location: [facility] ENDOSCOPY;  Service: Gastroenterology;  Laterality: N/A;  . EXTRACORPOREAL SHOCK WAVE LITHOTRIPSY Left 01/12/2019   Procedure: EXTRACORPOREAL SHOCK WAVE LITHOTRIPSY  (ESWL);  Surgeon: Billey Co, MD;  Location: [facility] ORS;  Service: Urology;  Laterality: Left;  . UPPER GI ENDOSCOPY      Family History  Problem Relation Age of Onset  . Alcohol abuse Father   . Breast cancer Mother 6    Social History   Socioeconomic History  . Marital status: Married    Spouse name: Not on file  . Number of children: Not on file  . Years of education: Not on file  . Highest education level: Not on file  Occupational History  . Not on file  Tobacco Use  . Smoking status: Former Smoker    Types: Cigarettes  . Smokeless tobacco: Never Used  . Tobacco comment: quit 30 years ago  Vaping Use  . Vaping Use: Never used  Substance and Sexual Activity  . Alcohol use: Yes    Alcohol/week: 12.0 standard drinks    Types: 12 Shots of liquor per week  . Drug use: No  . Sexual activity: Not on file  Other Topics Concern  . Not on file  Social History Narrative   Lives at home with his wife and takes care of his wife. Independent at baseline      - Biggest strain is financial but doesn't think they could got get more help.      Patient expressed interest in possible financial assistance. Informed written consent obtained   Social Determinants of Health   Financial Resource Strain: High Risk  . Difficulty of Paying Living Expenses:  Very hard  Food Insecurity: No Food Insecurity  . Worried About Charity fundraiser in the Last Year: Never true  . Ran Out of Food in the Last Year: Never true  Transportation Needs: No Transportation Needs  . Lack of Transportation (Medical): No  . Lack of Transportation (Non-Medical): No  Physical Activity: Sufficiently Active  . Days of Exercise per Week: 7 days  . Minutes of Exercise per Session: 60 min  Stress: Stress Concern Present  . Feeling of Stress : Very much  Social Connections: Socially Isolated  . Frequency of Communication with Friends and Family: Once a week  . Frequency of Social Gatherings with Friends  and Family: Once a week  . Attends Religious Services: Never  . Active Member of Clubs or Organizations: No  . Attends Archivist Meetings: Never  . Marital Status: Married  Human resources officer Violence: Not At Risk  . Fear of Current or Ex-Partner: No  . Emotionally Abused: No  . Physically Abused: No  . Sexually Abused: No    Outpatient Medications Prior to Visit  Medication Sig Dispense Refill  . allopurinol (ZYLOPRIM) 300 MG tablet TAKE ONE TABLET BY MOUTH EVERY DAY 90 tablet 0  . ascorbic acid (VITAMIN C) 500 MG tablet Take by mouth.    . chlordiazePOXIDE (LIBRIUM) 25 MG capsule Take 1 capsule (25 mg total) by mouth 3 (three) times daily as needed for up to 7 days for anxiety or withdrawal. 21 capsule 0  . citalopram (CELEXA) 20 MG tablet Take 1 tablet (20 mg total) by mouth daily. 30 tablet 1  . Fluticasone-Salmeterol (ADVAIR DISKUS) 100-50 MCG/DOSE AEPB Inhale 1 puff into the lungs 2 (two) times daily. 397 each 0  . folic acid (FOLVITE) 1 MG tablet Take 1 tablet (1 mg total) by mouth daily. 30 tablet 3  . lisinopril (ZESTRIL) 40 MG tablet TAKE ONE TABLET BY MOUTH EVERY DAY 90 tablet 0  . metoprolol tartrate (LOPRESSOR) 25 MG tablet Take 1 tablet (25 mg total) by mouth daily. 30 tablet 0  . pantoprazole (PROTONIX) 40 MG tablet Take 1 tablet (40 mg total) by mouth daily. 90 tablet 0  . thiamine 100 MG tablet Take 1 tablet (100 mg total) by mouth daily. 90 tablet 0  . VENTOLIN HFA 108 (90 Base) MCG/ACT inhaler INHALE 2 PUFFS EVERY 4 HOURS AS NEEDED 72 g 0   No facility-administered medications prior to visit.    Allergies  Allergen Reactions  . Percocet [Oxycodone-Acetaminophen] Nausea And Vomiting    ROS Review of Systems  Constitutional: Negative.   Respiratory: Negative.   Cardiovascular: Negative.   Gastrointestinal: Negative.   Neurological: Negative.   Psychiatric/Behavioral: Negative.       Objective:    Physical Exam No physical exam There were no  vitals taken for this visit. Wt Readings from Last 3 Encounters:  11/28/20 185 lb (83.9 kg)  11/09/20 200 lb (90.7 kg)  10/23/20 194 lb 6.4 oz (88.2 kg)     Health Maintenance Due  Topic Date Due  . Hepatitis C Screening  Never done  . COVID-19 Vaccine (1) Never done    There are no preventive care reminders to display for this patient.  Lab Results  Component Value Date   TSH 0.052 (L) 11/29/2020   Lab Results  Component Value Date   WBC 9.1 11/30/2020   HGB 11.3 (L) 11/30/2020   HCT 34.4 (L) 11/30/2020   MCV 84.9 11/30/2020   PLT 283 11/30/2020  Lab Results  Component Value Date   NA 140 11/30/2020   K 3.4 (L) 11/30/2020   CO2 23 11/30/2020   GLUCOSE 110 (H) 11/30/2020   BUN 17 11/30/2020   CREATININE 1.03 11/30/2020   BILITOT 0.5 11/29/2020   ALKPHOS 47 11/29/2020   AST 12 (L) 11/29/2020   ALT 13 11/29/2020   PROT 5.9 (L) 11/29/2020   ALBUMIN 3.2 (L) 11/29/2020   CALCIUM 8.3 (L) 11/30/2020   ANIONGAP 7 11/30/2020   Lab Results  Component Value Date   CHOL 155 05/22/2020   Lab Results  Component Value Date   HDL 65 05/22/2020   Lab Results  Component Value Date   LDLCALC 73 05/22/2020   Lab Results  Component Value Date   TRIG 92 05/22/2020   Lab Results  Component Value Date   CHOLHDL 2.4 05/22/2020   Lab Results  Component Value Date   HGBA1C 5.2 11/29/2020      Assessment & Plan:     1. Hospital discharge follow-up - He will continue to follow up with his discharge instructions, was advised to notify clinic with any concerns or go to the ED.  2. Other secondary hypertension - Per patient his blood pressure was 172/70, he was advised that his goal should be less than 150/90. He was advised to check daily after resting for 1-2 hours, record and bring log to follow up visit. He was advised to notify clinic for blood pressure consistently greater than 170's/90. He will continue on current treatment regimen, DASH diet. He was advised to  go to the ED for worsening symptoms.  Follow-up: Return in about 30 days (around 01/02/2021), or if symptoms worsen or fail to improve.    Remingtyn Depaola Jerold Coombe, NP

## 2020-12-03 NOTE — Patient Instructions (Signed)

## 2020-12-05 ENCOUNTER — Encounter: Payer: Self-pay | Admitting: Emergency Medicine

## 2020-12-05 ENCOUNTER — Emergency Department: Payer: Medicaid Other

## 2020-12-05 ENCOUNTER — Other Ambulatory Visit: Payer: Self-pay

## 2020-12-05 ENCOUNTER — Emergency Department
Admission: EM | Admit: 2020-12-05 | Discharge: 2020-12-05 | Disposition: A | Discharge status: Home / Self Care | Payer: Medicaid Other | Attending: Emergency Medicine | Admitting: Emergency Medicine

## 2020-12-05 DIAGNOSIS — J45909 Unspecified asthma, uncomplicated: Secondary | ICD-10-CM | POA: Insufficient documentation

## 2020-12-05 DIAGNOSIS — Z87891 Personal history of nicotine dependence: Secondary | ICD-10-CM | POA: Insufficient documentation

## 2020-12-05 DIAGNOSIS — R42 Dizziness and giddiness: Secondary | ICD-10-CM

## 2020-12-05 DIAGNOSIS — Z7951 Long term (current) use of inhaled steroids: Secondary | ICD-10-CM | POA: Insufficient documentation

## 2020-12-05 DIAGNOSIS — Z79899 Other long term (current) drug therapy: Secondary | ICD-10-CM | POA: Insufficient documentation

## 2020-12-05 DIAGNOSIS — R0602 Shortness of breath: Secondary | ICD-10-CM

## 2020-12-05 DIAGNOSIS — I10 Essential (primary) hypertension: Secondary | ICD-10-CM | POA: Insufficient documentation

## 2020-12-05 DIAGNOSIS — I959 Hypotension, unspecified: Secondary | ICD-10-CM | POA: Insufficient documentation

## 2020-12-05 DIAGNOSIS — E860 Dehydration: Secondary | ICD-10-CM

## 2020-12-05 LAB — CBC WITH DIFFERENTIAL/PLATELET
Abs Immature Granulocytes: 0.02 10*3/uL (ref 0.00–0.07)
Basophils Absolute: 0.1 10*3/uL (ref 0.0–0.1)
Basophils Relative: 1 %
Eosinophils Absolute: 0.2 10*3/uL (ref 0.0–0.5)
Eosinophils Relative: 3 %
HCT: 37.7 % — ABNORMAL LOW (ref 39.0–52.0)
Hemoglobin: 11.9 g/dL — ABNORMAL LOW (ref 13.0–17.0)
Immature Granulocytes: 0 %
Lymphocytes Relative: 38 %
Lymphs Abs: 2.8 10*3/uL (ref 0.7–4.0)
MCH: 27.9 pg (ref 26.0–34.0)
MCHC: 31.6 g/dL (ref 30.0–36.0)
MCV: 88.5 fL (ref 80.0–100.0)
Monocytes Absolute: 0.8 10*3/uL (ref 0.1–1.0)
Monocytes Relative: 10 %
Neutro Abs: 3.6 10*3/uL (ref 1.7–7.7)
Neutrophils Relative %: 48 %
Platelets: 231 10*3/uL (ref 150–400)
RBC: 4.26 MIL/uL (ref 4.22–5.81)
RDW: 17.5 % — ABNORMAL HIGH (ref 11.5–15.5)
WBC: 7.4 10*3/uL (ref 4.0–10.5)
nRBC: 0 % (ref 0.0–0.2)

## 2020-12-05 LAB — BASIC METABOLIC PANEL
Anion gap: 12 (ref 5–15)
Anion gap: 12 (ref 5–15)
BUN: 14 mg/dL (ref 8–23)
BUN: 14 mg/dL (ref 8–23)
CO2: 21 mmol/L — ABNORMAL LOW (ref 22–32)
CO2: 21 mmol/L — ABNORMAL LOW (ref 22–32)
Calcium: 8.7 mg/dL — ABNORMAL LOW (ref 8.9–10.3)
Calcium: 8.5 mg/dL — ABNORMAL LOW (ref 8.9–10.3)
Chloride: 112 mmol/L — ABNORMAL HIGH (ref 98–111)
Chloride: 110 mmol/L (ref 98–111)
Creatinine, Ser: 1.6 mg/dL — ABNORMAL HIGH (ref 0.61–1.24)
Creatinine, Ser: 1.22 mg/dL (ref 0.61–1.24)
GFR, Estimated: 49 mL/min — ABNORMAL LOW (ref >60)
GFR, Estimated: >60 mL/min (ref >60)
Glucose, Bld: 144 mg/dL — ABNORMAL HIGH (ref 70–99)
Glucose, Bld: 120 mg/dL — ABNORMAL HIGH (ref 70–99)
Potassium: 3.3 mmol/L — ABNORMAL LOW (ref 3.5–5.1)
Potassium: 3.6 mmol/L (ref 3.5–5.1)
Sodium: 145 mmol/L (ref 135–145)
Sodium: 143 mmol/L (ref 135–145)

## 2020-12-05 LAB — CBC
HCT: 33 % — ABNORMAL LOW (ref 39.0–52.0)
Hemoglobin: 10.3 g/dL — ABNORMAL LOW (ref 13.0–17.0)
MCH: 27.4 pg (ref 26.0–34.0)
MCHC: 31.2 g/dL (ref 30.0–36.0)
MCV: 87.8 fL (ref 80.0–100.0)
Platelets: 208 10*3/uL (ref 150–400)
RBC: 3.76 MIL/uL — ABNORMAL LOW (ref 4.22–5.81)
RDW: 17.5 % — ABNORMAL HIGH (ref 11.5–15.5)
WBC: 7.7 10*3/uL (ref 4.0–10.5)
nRBC: 0 % (ref 0.0–0.2)

## 2020-12-05 MED ORDER — SODIUM CHLORIDE 0.9 % IV BOLUS
1000.0000 mL | Freq: Once | INTRAVENOUS | Status: AC
  Administered 2020-12-05: 1000 mL via INTRAVENOUS

## 2020-12-05 MED ORDER — ONDANSETRON HCL 4 MG/2ML IJ SOLN
4.0000 mg | Freq: Once | INTRAMUSCULAR | Status: AC
  Administered 2020-12-05: 4 mg via INTRAVENOUS
  Filled 2020-12-05: qty 2

## 2020-12-05 NOTE — ED Provider Notes (Signed)
  Patient received in signout from Dr. Archie Balboa pending fluid bolus completion for treatment of presyncopal dizziness and dehydration.  2 L completed with resolution of hypotensive pressures.  Patient subsequently tolerating p.o. intake and I see no barriers to outpatient management.  We discussed return precautions for the ED prior to discharge.   Vladimir Crofts, MD 12/05/20 [phone]

## 2020-12-05 NOTE — ED Notes (Signed)
No e-signature obtained due to no e-signature pad being available.  Pt verbalized understanding of all discharge instructions and f/u care.

## 2020-12-05 NOTE — ED Notes (Signed)
Pt resting in NAD at this time.

## 2020-12-05 NOTE — ED Triage Notes (Signed)
Returns to ED with C/O dizziness. Patient is AAOx3.  Skin warm and dry. NAD

## 2020-12-05 NOTE — ED Notes (Addendum)
Pt arrives from triage via w/c after arrival bp hypotensive (sbp 70s) -pt with h/o same -- pt is symptomatic - c/o dizziness. 1L NS bolus now infusing to 20G L FA; dressing dry and intact- site free of complications.   Provider now at bedside.

## 2020-12-05 NOTE — ED Notes (Signed)
Breakfast tray ordered for patient at this time.

## 2020-12-05 NOTE — ED Provider Notes (Signed)
[facility] Emergency Department Provider Note   ____________________________________________   I have reviewed the triage vital signs and the nursing notes.   HISTORY  Chief Complaint Dizziness   History limited by: Not Limited   HPI Miguel [name] is a 60 y.o. male who presents to the emergency department today with primary concern for dizziness.  He states that it started about 4 5 hours ago.  He says he has felt lightheaded.  Felt like he might pass out.  Patient had similar feelings and was admitted to the hospital last week for orthostatic hypotension.  Patient also has complaints of tremors.  He states that he has been trying to keep up with his appetite and fluid intake.    Records reviewed. Per medical record review patient has a history of recent admission where he was found to be hypotensive.   Past Medical History:  Diagnosis Date  . Alcohol abuse   . Anxiety   . Asthma   . Family history of breast cancer   . GERD (gastroesophageal reflux disease)   . Gout   . Hx of colonic polyps   . Hypertension   . Kidney stone   . 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 OCD (obsessive compulsive disorder)   . Renal colic     Patient Active Problem List   Diagnosis Date Noted  . Hypotension 11/28/2020  . Genetic testing 07/15/2020  . 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 Right ankle pain 06/26/2020  . Family history of breast cancer   . Hx of colonic polyps   . Hospital discharge follow-up 06/05/2020  . Kidney function test abnormal 06/05/2020  . Abdominal pain 04/12/2020  . Hypertensive urgency 04/12/2020  . Alcohol withdrawal ([place]) 03/04/2020  . Right-sided chest wall pain 02/28/2020  . Alcohol abuse with alcohol-induced mood disorder ([place]) 12/02/2019  . Moderate episode of recurrent major depressive disorder ([place])   . Health care maintenance 10/26/2019  . Right knee pain 10/26/2019  . Generalized anxiety  disorder 10/14/2019  . MDD (major depressive disorder), recurrent severe, without psychosis ([place]) 07/27/2019  . Social anxiety disorder 07/27/2019  . Left-sided chest wall pain 06/14/2019  . Alcoholic cirrhosis of liver without ascites ([place]) 06/14/2019  . Alcohol abuse 06/14/2019  . AKI (acute kidney injury) ([place]) 05/27/2019  . Alcohol withdrawal syndrome without complication ([place]) 16/55/3748  . Alcoholic intoxication without complication ([place])   . Sepsis ([place]) 03/08/2019  . Breast lump or mass 10/04/2018  . Sepsis secondary to UTI ([place]) 09/14/2018  . Asthma 07/07/2018  . GERD (gastroesophageal reflux disease) 07/07/2018  . OCD (obsessive compulsive disorder) 07/07/2018  . Self-inflicted laceration of left wrist ([place]) 03/10/2018  . Leg hematoma 12/25/2017  . Suicide and self-inflicted injury by cutting and piercing instrument ([place]) 03/10/2017  . Severe recurrent major 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 ([place]) 03/09/2017  . Substance induced mood disorder ([place]) 08/15/2016  . Involuntary commitment 08/15/2016  . Alcohol use disorder, severe, dependence ([place]) 02/05/2016  . Hypertension 12/05/2015  . Tachycardia 12/05/2015  . Gout 11/13/2015  . Chronic back pain 05/02/2015    Past Surgical History:  Procedure Laterality Date  . CHOLECYSTECTOMY  2012  . COLONOSCOPY    . COLONOSCOPY WITH PROPOFOL N/A 05/28/2020   Procedure: COLONOSCOPY WITH PROPOFOL;  Surgeon: [name], MD;  Location: [facility] ENDOSCOPY;  Service: Gastroenterology;  Laterality: N/A;  . EXTRACORPOREAL SHOCK WAVE LITHOTRIPSY Left 01/12/2019   Procedure: EXTRACORPOREAL SHOCK WAVE  LITHOTRIPSY (ESWL);  Surgeon: Billey Co, MD;  Location: [facility] ORS;  Service: Urology;  Laterality: Left;  . UPPER GI ENDOSCOPY      Prior to Admission medications   Medication Sig Start Date End Date Taking? Authorizing Provider  allopurinol (ZYLOPRIM) 300 MG tablet TAKE ONE TABLET BY MOUTH EVERY DAY 09/18/20   Iloabachie, Chioma E, NP   ascorbic acid (VITAMIN C) 500 MG tablet Take by mouth. 08/01/19   [provider]  chlordiazePOXIDE (LIBRIUM) 25 MG capsule Take 1 capsule (25 mg total) by mouth 3 (three) times daily as needed for up to 7 days for anxiety or withdrawal. 11/30/20 12/07/20  Wyvonnia Dusky, MD  citalopram (CELEXA) 20 MG tablet Take 1 tablet (20 mg total) by mouth daily. 03/21/20 03/21/21  Clapacs, Madie Reno, MD  Fluticasone-Salmeterol (ADVAIR DISKUS) 100-50 MCG/DOSE AEPB Inhale 1 puff into the lungs 2 (two) times daily. 03/21/20   Clapacs, Madie Reno, MD  folic acid (FOLVITE) 1 MG tablet Take 1 tablet (1 mg total) by mouth daily. 07/16/20   Iloabachie, Chioma E, NP  lisinopril (ZESTRIL) 40 MG tablet TAKE ONE TABLET BY MOUTH EVERY DAY 09/18/20   Iloabachie, Chioma E, NP  metoprolol tartrate (LOPRESSOR) 25 MG tablet Take 1 tablet (25 mg total) by mouth daily. 11/30/20 12/30/20  Wyvonnia Dusky, MD  pantoprazole (PROTONIX) 40 MG tablet Take 1 tablet (40 mg total) by mouth daily. 10/15/20   Iloabachie, Chioma E, NP  thiamine 100 MG tablet Take 1 tablet (100 mg total) by mouth daily. 10/22/20   Iloabachie, Chioma E, NP  VENTOLIN HFA 108 (90 Base) MCG/ACT inhaler INHALE 2 PUFFS EVERY 4 HOURS AS NEEDED 10/22/20   Iloabachie, Chioma E, NP    Allergies Percocet [oxycodone-acetaminophen]  Family History  Problem Relation Age of Onset  . Alcohol abuse Father   . Breast cancer Mother 77    Social History Social History   Tobacco Use  . Smoking status: Former Smoker    Types: Cigarettes  . Smokeless tobacco: Never Used  . Tobacco comment: quit 30 years ago  Vaping Use  . Vaping Use: Never used  Substance Use Topics  . Alcohol use: Yes    Alcohol/week: 12.0 standard drinks    Types: 12 Shots of liquor per week  . Drug use: No    Review of Systems Constitutional: No fever/chills Eyes: No visual changes. ENT: No sore throat. Cardiovascular: Denies chest pain. Respiratory: Denies shortness of  breath. Gastrointestinal: No abdominal pain.  No nausea, no vomiting.  No diarrhea.   Genitourinary: Negative for dysuria. Musculoskeletal: Negative for back pain. Skin: Negative for rash. Neurological: Positive for dizziness. ____________________________________________   PHYSICAL EXAM:  VITAL SIGNS: ED Triage Vitals  Enc Vitals Group     BP 12/05/20 0504 (!) 89/59     Pulse Rate 12/05/20 0504 99     Resp 12/05/20 0504 15     Temp 12/05/20 0504 98.2 F (36.8 C)     Temp Source 12/05/20 0504 Oral     SpO2 12/05/20 0504 97 %     Weight 12/05/20 0510 185 lb (83.9 kg)     Height 12/05/20 0510 6' (1.829 m)     Head Circumference --      Peak Flow --      Pain Score 12/05/20 0509 0   Constitutional: Alert and oriented.  Eyes: Conjunctivae are normal.  ENT      Head: Normocephalic and atraumatic.      Nose:  No congestion/rhinnorhea.      Mouth/Throat: Mucous membranes are moist.      Neck: No stridor. Hematological/Lymphatic/Immunilogical: No cervical lymphadenopathy. Cardiovascular: Normal rate, regular rhythm.  No murmurs, rubs, or gallops.  Respiratory: Normal respiratory effort without tachypnea nor retractions. Breath sounds are clear and equal bilaterally. No wheezes/rales/rhonchi. Gastrointestinal: Soft and non tender. No rebound. No guarding.  Genitourinary: Deferred Musculoskeletal: Normal range of motion in all extremities. No lower extremity edema. Neurologic:  Normal speech and language. No gross focal neurologic deficits are appreciated.  Skin:  Skin is warm, dry and intact. No rash noted. Psychiatric: Mood and affect are normal. Speech and behavior are normal. Patient exhibits appropriate insight and judgment.  ____________________________________________    LABS (pertinent positives/negatives)  CBC wbc 7.4, hgb 11.9, plt 231 BMP na 145, k 3.3, glu 144, cr 1.60 ____________________________________________   EKG  I, Nance Pear, attending physician,  personally viewed and interpreted this EKG  EKG Time: 0502 Rate: 98 Rhythm: normal sinus rhythm Axis: normal Intervals: qtc 492 QRS: q waves III, v1 ST changes: no st elevation Impression: abnormal ekg  ____________________________________________    RADIOLOGY  CXR Mild left basilar atelectasis with possible small pleural effusion  ____________________________________________   PROCEDURES  Procedures  ____________________________________________   INITIAL IMPRESSION / ASSESSMENT AND PLAN / ED COURSE  Pertinent labs & imaging results that were available during my care of the patient were reviewed by me and considered in my medical decision making (see chart for details).   Patient presented to the emergency department today because of concerns for dizziness.  Patient was quite hypotensive upon arrival.  Patient without any fever.  No leukocytosis.  Creatinine is elevated consistent with dehydration. At this time doubt infection. Per chart review he did respond well to fluids during previous admission with similar symptoms. Will plan on IV hydration and reassessment.   ____________________________________________   FINAL CLINICAL IMPRESSION(S) / ED DIAGNOSES  Final diagnoses:  Dizziness  Dehydration     Note: This dictation was prepared with Dragon dictation. Any transcriptional errors that result from this process are unintentional     Nance Pear, MD 12/05/20 [phone]

## 2020-12-05 NOTE — ED Triage Notes (Signed)
Patient ambulatory to triage with steady gait, without difficulty or distress noted; pt reports dizziness and SHOB tonight; st seen recently for same; denies any recent illness

## 2020-12-05 NOTE — Discharge Instructions (Signed)
Please seek medical attention for any high fevers, chest pain, shortness of breath, change in behavior, persistent vomiting, bloody stool or any other new or concerning symptoms.  

## 2020-12-05 NOTE — ED Notes (Signed)
Pt resting with eyes closed; RR even and unlabored on RA -- symmetrical rise and fall of chest.  Current bp 84/51 (MAP 62) - HR 79 after completion of 1L NS bolus -- Dr Archie Balboa notified with orders now given to infuse additional 1L NS

## 2020-12-06 ENCOUNTER — Emergency Department
Admission: EM | Admit: 2020-12-06 | Discharge: 2020-12-06 | Disposition: A | Discharge status: Home / Self Care | Payer: Medicaid Other | Attending: Emergency Medicine | Admitting: Emergency Medicine

## 2020-12-06 DIAGNOSIS — I959 Hypotension, unspecified: Secondary | ICD-10-CM

## 2020-12-06 LAB — URINALYSIS, ROUTINE W REFLEX MICROSCOPIC
Bilirubin Urine: NEGATIVE
Glucose, UA: NEGATIVE mg/dL
Hgb urine dipstick: NEGATIVE
Ketones, ur: NEGATIVE mg/dL
Leukocytes,Ua: NEGATIVE
Nitrite: NEGATIVE
Protein, ur: NEGATIVE mg/dL
Specific Gravity, Urine: 1.013 (ref 1.005–1.030)
pH: 6 (ref 5.0–8.0)

## 2020-12-06 MED ORDER — ONDANSETRON HCL 4 MG/2ML IJ SOLN
4.0000 mg | Freq: Once | INTRAMUSCULAR | Status: AC
  Administered 2020-12-06: 4 mg via INTRAVENOUS
  Filled 2020-12-06: qty 2

## 2020-12-06 MED ORDER — LORAZEPAM 2 MG/ML IJ SOLN
1.0000 mg | Freq: Once | INTRAMUSCULAR | Status: AC
  Administered 2020-12-06: 1 mg via INTRAVENOUS
  Filled 2020-12-06: qty 1

## 2020-12-06 MED ORDER — THIAMINE HCL 100 MG/ML IJ SOLN
100.0000 mg | Freq: Once | INTRAMUSCULAR | Status: AC
  Administered 2020-12-06: 100 mg via INTRAVENOUS
  Filled 2020-12-06: qty 2

## 2020-12-06 NOTE — ED Notes (Signed)
Pt agreeable with d/c plan as discussed by provider- this nurse has verbally reinforced d/c instructions and provided written copy - pt acknowledges verbal understanding and denies any additional questions concerns needs.

## 2020-12-06 NOTE — Discharge Instructions (Addendum)
I recommend increasing your water intake at home and avoiding alcohol.  Your labs, chest x-ray, urine and EKG today were reassuring.   Please hold your lisinopril and metoprolol at this time and follow-up closely with your doctor in the next few days.

## 2020-12-06 NOTE — ED Provider Notes (Addendum)
[facility] Emergency Department Provider Note  ____________________________________________   Event Date/Time   First MD Initiated Contact with Patient 12/06/20 [id number]     (approximate)  I have reviewed the triage vital signs and the nursing notes.   HISTORY  Chief Complaint No chief complaint on file.    HPI Miguel [name] is a 60 y.o. male with history of alcohol abuse, hypertension who presents to the emergency department with lightheadedness.  Was seen here yesterday for the same.  Was admitted 1/14-1/15 for the same.  Improved with IV fluids.  States he is feeling nauseated and has decreased appetite.  States he has not eaten much in the past 24 hours other than a biscuit.  No vomiting or diarrhea.  No bloody stool or melena.  No chest pain or shortness of breath.  States he did have a fall because of his lightheadedness and hit the right side of his face.  He is not on blood thinners.  No neck or back pain.  Able to ambulate.  Reports he has had chills but no fever.  Recently had cough, myalgias but tested negative for COVID-19.  He has had 2 doses of Moderna COVID-19 vaccine.  Reports he has not had any alcohol in several days.        Past Medical History:  Diagnosis Date  . Alcohol abuse   . Anxiety   . Asthma   . Family history of breast cancer   . GERD (gastroesophageal reflux disease)   . Gout   . Hx of colonic polyps   . Hypertension   . Kidney stone   . 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 OCD (obsessive compulsive disorder)   . Renal colic     Patient Active Problem List   Diagnosis Date Noted  . Hypotension 11/28/2020  . Genetic testing 07/15/2020  . 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 Right ankle pain 06/26/2020  . Family history of breast cancer   . Hx of colonic polyps   . Hospital discharge follow-up 06/05/2020  . Kidney function test abnormal 06/05/2020  . Abdominal pain 04/12/2020  .  Hypertensive urgency 04/12/2020  . Alcohol withdrawal ([place]) 03/04/2020  . Right-sided chest wall pain 02/28/2020  . Alcohol abuse with alcohol-induced mood disorder ([place]) 12/02/2019  . Moderate episode of recurrent major depressive disorder ([place])   . Health care maintenance 10/26/2019  . Right knee pain 10/26/2019  . Generalized anxiety disorder 10/14/2019  . MDD (major depressive disorder), recurrent severe, without psychosis ([place]) 07/27/2019  . Social anxiety disorder 07/27/2019  . Left-sided chest wall pain 06/14/2019  . Alcoholic cirrhosis of liver without ascites ([place]) 06/14/2019  . Alcohol abuse 06/14/2019  . AKI (acute kidney injury) ([place]) 05/27/2019  . Alcohol withdrawal syndrome without complication ([place]) 19/41/7408  . Alcoholic intoxication without complication ([place])   . Sepsis ([place]) 03/08/2019  . Breast lump or mass 10/04/2018  . Sepsis secondary to UTI ([place]) 09/14/2018  . Asthma 07/07/2018  . GERD (gastroesophageal reflux disease) 07/07/2018  . OCD (obsessive compulsive disorder) 07/07/2018  . Self-inflicted laceration of left wrist ([place]) 03/10/2018  . Leg hematoma 12/25/2017  . Suicide and self-inflicted injury by cutting and piercing instrument ([place]) 03/10/2017  . 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 Substance induced mood disorder ([place]) 08/15/2016  . Involuntary commitment 08/15/2016  . Alcohol use disorder, severe, dependence ([place]) 02/05/2016  . Hypertension 12/05/2015  .  Tachycardia 12/05/2015  . Gout 11/13/2015  . Chronic back pain 05/02/2015    Past Surgical History:  Procedure Laterality Date  . CHOLECYSTECTOMY  2012  . COLONOSCOPY    . COLONOSCOPY WITH PROPOFOL N/A 05/28/2020   Procedure: COLONOSCOPY WITH PROPOFOL;  Surgeon: [name], MD;  Location: [facility] ENDOSCOPY;  Service: Gastroenterology;  Laterality: N/A;  . EXTRACORPOREAL SHOCK WAVE LITHOTRIPSY Left 01/12/2019   Procedure: EXTRACORPOREAL SHOCK WAVE  LITHOTRIPSY (ESWL);  Surgeon: Billey Co, MD;  Location: [facility] ORS;  Service: Urology;  Laterality: Left;  . UPPER GI ENDOSCOPY      Prior to Admission medications   Medication Sig Start Date End Date Taking? Authorizing Provider  allopurinol (ZYLOPRIM) 300 MG tablet TAKE ONE TABLET BY MOUTH EVERY DAY 09/18/20   Iloabachie, Chioma E, NP  ascorbic acid (VITAMIN C) 500 MG tablet Take by mouth. 08/01/19   [provider]  chlordiazePOXIDE (LIBRIUM) 25 MG capsule Take 1 capsule (25 mg total) by mouth 3 (three) times daily as needed for up to 7 days for anxiety or withdrawal. 11/30/20 12/07/20  Wyvonnia Dusky, MD  citalopram (CELEXA) 20 MG tablet Take 1 tablet (20 mg total) by mouth daily. 03/21/20 03/21/21  Clapacs, Madie Reno, MD  Fluticasone-Salmeterol (ADVAIR DISKUS) 100-50 MCG/DOSE AEPB Inhale 1 puff into the lungs 2 (two) times daily. 03/21/20   Clapacs, Madie Reno, MD  folic acid (FOLVITE) 1 MG tablet Take 1 tablet (1 mg total) by mouth daily. 07/16/20   Iloabachie, Chioma E, NP  lisinopril (ZESTRIL) 40 MG tablet TAKE ONE TABLET BY MOUTH EVERY DAY 09/18/20   Iloabachie, Chioma E, NP  metoprolol tartrate (LOPRESSOR) 25 MG tablet Take 1 tablet (25 mg total) by mouth daily. 11/30/20 12/30/20  Wyvonnia Dusky, MD  pantoprazole (PROTONIX) 40 MG tablet Take 1 tablet (40 mg total) by mouth daily. 10/15/20   Iloabachie, Chioma E, NP  thiamine 100 MG tablet Take 1 tablet (100 mg total) by mouth daily. 10/22/20   Iloabachie, Chioma E, NP  VENTOLIN HFA 108 (90 Base) MCG/ACT inhaler INHALE 2 PUFFS EVERY 4 HOURS AS NEEDED 10/22/20   Iloabachie, Chioma E, NP    Allergies Percocet [oxycodone-acetaminophen]  Family History  Problem Relation Age of Onset  . Alcohol abuse Father   . Breast cancer Mother 83    Social History Social History   Tobacco Use  . Smoking status: Former Smoker    Types: Cigarettes  . Smokeless tobacco: Never Used  . Tobacco comment: quit 30 years ago  Vaping Use  . Vaping  Use: Never used  Substance Use Topics  . Alcohol use: Yes    Alcohol/week: 12.0 standard drinks    Types: 12 Shots of liquor per week  . Drug use: No    Review of Systems Constitutional: No fever. Eyes: No visual changes. ENT: No sore throat. Cardiovascular: Denies chest pain. Respiratory: Denies shortness of breath. Gastrointestinal: + Nausea.  No vomiting, diarrhea. Genitourinary: Negative for dysuria. Musculoskeletal: Negative for back pain. Skin: Negative for rash. Neurological: Negative for focal weakness or numbness.  ____________________________________________   PHYSICAL EXAM:  VITAL SIGNS: ED Triage Vitals  Enc Vitals Group     BP 12/05/20 1656 (!) 87/57     Pulse Rate 12/05/20 1656 76     Resp 12/05/20 1656 16     Temp 12/05/20 1656 97.6 F (36.4 C)     Temp Source 12/05/20 1656 Oral     SpO2 12/05/20 1656 96 %  Weight 12/05/20 1657 184 lb 15.5 oz (83.9 kg)     Height 12/05/20 1657 6' (1.829 m)     Head Circumference --      Peak Flow --      Pain Score 12/05/20 1657 0     Pain Loc --      Pain Edu? --      Excl. in [place]? --    CONSTITUTIONAL: Alert and oriented and responds appropriately to questions. Well-appearing; well-nourished HEAD: Normocephalic EYES: Conjunctivae clear, pupils appear equal, EOM appear intact ENT: normal nose; moist mucous membranes NECK: Supple, normal ROM CARD: RRR; S1 and S2 appreciated; no murmurs, no clicks, no rubs, no gallops RESP: Normal chest excursion without splinting or tachypnea; breath sounds clear and equal bilaterally; no wheezes, no rhonchi, no rales, no hypoxia or respiratory distress, speaking full sentences ABD/GI: Normal bowel sounds; non-distended; soft, non-tender, no rebound, no guarding, no peritoneal signs, no hepatosplenomegaly BACK: The back appears normal EXT: Normal ROM in all joints; no deformity noted, no edema; no cyanosis SKIN: Normal color for age and race; warm; no rash on exposed skin NEURO:  Moves all extremities equally, slightly tremulous, no facial asymmetry, normal speech, normal and steady gait PSYCH: The patient's mood and manner are appropriate.  ____________________________________________   LABS (all labs ordered are listed, but only abnormal results are displayed)  Labs Reviewed  BASIC METABOLIC PANEL - Abnormal; Notable for the following components:      Result Value   CO2 21 (*)    Glucose, Bld 120 (*)    Calcium 8.5 (*)    All other components within normal limits  CBC - Abnormal; Notable for the following components:   RBC 3.76 (*)    Hemoglobin 10.3 (*)    HCT 33.0 (*)    RDW 17.5 (*)    All other components within normal limits  URINALYSIS, ROUTINE W REFLEX MICROSCOPIC   ____________________________________________  EKG   EKG Interpretation  Date/Time:  Thursday December 05 2020 17:30:31 EST Ventricular Rate:  80 PR Interval:  156 QRS Duration: 92 QT Interval:  412 QTC Calculation: 475 R Axis:   -29 Text Interpretation: Normal sinus rhythm Low voltage QRS Inferior infarct , age undetermined Possible Anterolateral infarct , age undetermined Abnormal ECG No significant change since last tracing Confirmed by Pryor Curia [phone]) on 12/06/2020 2:52:13 AM       ____________________________________________  RADIOLOGY Jessie Foot Avanti Jetter, personally viewed and evaluated these images (plain radiographs) as part of my medical decision making, as well as reviewing the written report by the radiologist.  ED MD interpretation: No infiltrate or edema.  Official radiology report(s): DG Chest Port 1 View  Result Date: 12/05/2020 CLINICAL DATA:  Shortness of breath.  Dizziness. EXAM: PORTABLE CHEST 1 VIEW COMPARISON:  November 28, 2020 FINDINGS: The heart size is stable. There is no pneumothorax. No large pleural effusion. There is atelectasis at the left lung base with a possible trace left-sided pleural effusion. There is no acute osseous abnormality.  IMPRESSION: Mild left basilar atelectasis with a possible trace left-sided pleural effusion. Electronically Signed   By: Constance Holster M.D.   On: 12/05/2020 05:35    ____________________________________________   PROCEDURES  Procedure(s) performed (including Critical Care):  Procedures  CRITICAL CARE Performed by: Cyril Mourning Imari Reen   Total critical care time: 55 minutes  Critical care time was exclusive of separately billable procedures and treating other patients.  Critical care was necessary to treat or prevent imminent or life-threatening deterioration.  Critical care was time spent personally by me on the following activities: development of treatment plan with patient and/or surrogate as well as nursing, discussions with consultants, evaluation of patient's response to treatment, examination of patient, obtaining history from patient or surrogate, ordering and performing treatments and interventions, ordering and review of laboratory studies, ordering and review of radiographic studies, pulse oximetry and re-evaluation of patient's condition.  ____________________________________________   INITIAL IMPRESSION / ASSESSMENT AND PLAN / ED COURSE  As part of my medical decision making, I reviewed the following data within the [place] notes reviewed and incorporated, Labs reviewed show no acute abnormality, EKG interpreted NSR, Old EKG reviewed, Old chart reviewed, Radiograph reviewed shows no acute abnormality and Notes from prior ED visits         Patient here with hypotension.  Has history of orthostatic hypotension with recent admission for the same.  Was just seen in the ED yesterday for the same and this improved with IV hydration.  Blood pressure was initially very low on arrival here but has received 2 L of normal saline and blood pressure appears to be normal.  He is slightly tremulous and I suspect there is some component of alcohol withdrawal.   We will give IV thiamine, Ativan.  Will p.o. challenge.  Will recheck orthostats.  If these are improved and patient is able to tolerate p.o., anticipate discharge home.  He has no infectious symptoms.  Doubt sepsis.  Chest x-ray shows no sign of infiltrate.  Urine pending.  Hemoglobin stable.  Electrolytes within normal limits.  He did have a mild AKI when he was last seen in the ED but this has improved.  EKG shows no ischemia.  Doubt septic or cardiogenic shock.  Suspect patient is hypovolemic due to decreased oral intake and alcohol abuse.   4:15 AM  Pt tolerating p.o.  He is no longer orthostatic and is now actually hypertensive.  Urine pending.  5:50 AM  Pt's urine shows no sign of infection and no ketones.  He reports feeling much better.  His tremulousness has improved after IV Ativan.  I feel he is safe for discharge home.  Recommended increase fluid intake at home.  Patient able to ambulate in the ED without difficulty. He is also comfortable with plan to be discharged home. Have recommended he hold his metoprolol and lisinopril and follow-up closely with his PCP.  At this time, I do not feel there is any life-threatening condition present. I have reviewed, interpreted and discussed all results (EKG, imaging, lab, urine as appropriate) and exam findings with patient/family. I have reviewed nursing notes and appropriate previous records.  I feel the patient is safe to be discharged home without further emergent workup and can continue workup as an outpatient as needed. Discussed usual and customary return precautions. Patient/family verbalize understanding and are comfortable with this plan.  Outpatient follow-up has been provided as needed. All questions have been answered.  ____________________________________________   FINAL CLINICAL IMPRESSION(S) / ED DIAGNOSES  Final diagnoses:  Hypotension, unspecified hypotension type     ED Discharge Orders    None      *Please note:  Miguel [name] was evaluated in Emergency Department on 12/06/2020 for the symptoms described in the history of present illness. He was evaluated in the context of the global COVID-19 pandemic, which necessitated consideration that the patient might be at risk for infection with the SARS-CoV-2 virus that causes COVID-19. Institutional protocols and algorithms  that pertain to the evaluation of patients at risk for COVID-19 are in a state of rapid change based on information released by regulatory bodies including the CDC and federal and state organizations. These policies and algorithms were followed during the patient's care in the ED.  Some ED evaluations and interventions may be delayed as a result of limited staffing during and the pandemic.*   Note:  This document was prepared using Dragon voice recognition software and may include unintentional dictation errors.   Vannary Greening, Delice Bison, DO 12/06/20 [place], Delice Bison, DO 12/06/20 [phone]

## 2020-12-06 NOTE — ED Notes (Signed)
Pt sleeping at this time. Audible snoring heard .

## 2020-12-09 ENCOUNTER — Encounter: Payer: Self-pay | Admitting: *Deleted

## 2020-12-09 ENCOUNTER — Other Ambulatory Visit: Payer: Self-pay

## 2020-12-09 DIAGNOSIS — R42 Dizziness and giddiness: Secondary | ICD-10-CM | POA: Insufficient documentation

## 2020-12-09 DIAGNOSIS — I10 Essential (primary) hypertension: Secondary | ICD-10-CM | POA: Insufficient documentation

## 2020-12-09 DIAGNOSIS — Z5321 Procedure and treatment not carried out due to patient leaving prior to being seen by health care provider: Secondary | ICD-10-CM | POA: Insufficient documentation

## 2020-12-09 LAB — CBC
HCT: 38.1 % — ABNORMAL LOW (ref 39.0–52.0)
Hemoglobin: 12.2 g/dL — ABNORMAL LOW (ref 13.0–17.0)
MCH: 27.4 pg (ref 26.0–34.0)
MCHC: 32 g/dL (ref 30.0–36.0)
MCV: 85.6 fL (ref 80.0–100.0)
Platelets: 232 10*3/uL (ref 150–400)
RBC: 4.45 MIL/uL (ref 4.22–5.81)
RDW: 18 % — ABNORMAL HIGH (ref 11.5–15.5)
WBC: 6.1 10*3/uL (ref 4.0–10.5)
nRBC: 0 % (ref 0.0–0.2)

## 2020-12-09 LAB — BASIC METABOLIC PANEL
Anion gap: 12 (ref 5–15)
BUN: 13 mg/dL (ref 8–23)
CO2: 25 mmol/L (ref 22–32)
Calcium: 8.9 mg/dL (ref 8.9–10.3)
Chloride: 107 mmol/L (ref 98–111)
Creatinine, Ser: 1.13 mg/dL (ref 0.61–1.24)
GFR, Estimated: >60 mL/min (ref >60)
Glucose, Bld: 117 mg/dL — ABNORMAL HIGH (ref 70–99)
Potassium: 4.3 mmol/L (ref 3.5–5.1)
Sodium: 144 mmol/L (ref 135–145)

## 2020-12-09 LAB — TROPONIN I (HIGH SENSITIVITY): Troponin I (High Sensitivity): 3 ng/L (ref <18)
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
## 2020-12-09 NOTE — ED Notes (Signed)
Red top blood tube sent if needed

## 2020-12-10 ENCOUNTER — Emergency Department
Admission: EM | Admit: 2020-12-10 | Discharge: 2020-12-10 | Disposition: A | Discharge status: Home / Self Care | Payer: Medicaid Other | Attending: Emergency Medicine | Admitting: Emergency Medicine

## 2020-12-10 NOTE — ED Notes (Signed)
No answer when called several times from lobby 

## 2020-12-11 ENCOUNTER — Emergency Department
Admission: EM | Admit: 2020-12-11 | Discharge: 2020-12-11 | Disposition: A | Discharge status: Home / Self Care | Payer: Self-pay | Attending: Emergency Medicine | Admitting: Emergency Medicine

## 2020-12-11 ENCOUNTER — Other Ambulatory Visit: Payer: Self-pay

## 2020-12-11 ENCOUNTER — Emergency Department: Payer: Self-pay

## 2020-12-11 ENCOUNTER — Encounter: Payer: Self-pay | Admitting: Emergency Medicine

## 2020-12-11 DIAGNOSIS — W19XXXA Unspecified fall, initial encounter: Secondary | ICD-10-CM

## 2020-12-11 DIAGNOSIS — Z7951 Long term (current) use of inhaled steroids: Secondary | ICD-10-CM | POA: Insufficient documentation

## 2020-12-11 DIAGNOSIS — R0602 Shortness of breath: Secondary | ICD-10-CM

## 2020-12-11 DIAGNOSIS — S0990XA Unspecified injury of head, initial encounter: Secondary | ICD-10-CM | POA: Insufficient documentation

## 2020-12-11 DIAGNOSIS — Z20822 Contact with and (suspected) exposure to covid-19: Secondary | ICD-10-CM | POA: Insufficient documentation

## 2020-12-11 DIAGNOSIS — J45909 Unspecified asthma, uncomplicated: Secondary | ICD-10-CM | POA: Insufficient documentation

## 2020-12-11 DIAGNOSIS — R531 Weakness: Secondary | ICD-10-CM

## 2020-12-11 DIAGNOSIS — Z79899 Other long term (current) drug therapy: Secondary | ICD-10-CM | POA: Insufficient documentation

## 2020-12-11 DIAGNOSIS — W010XXA Fall on same level from slipping, tripping and stumbling without subsequent striking against object, initial encounter: Secondary | ICD-10-CM | POA: Insufficient documentation

## 2020-12-11 DIAGNOSIS — I10 Essential (primary) hypertension: Secondary | ICD-10-CM | POA: Insufficient documentation

## 2020-12-11 DIAGNOSIS — F10129 Alcohol abuse with intoxication, unspecified: Secondary | ICD-10-CM | POA: Insufficient documentation

## 2020-12-11 DIAGNOSIS — Z87891 Personal history of nicotine dependence: Secondary | ICD-10-CM | POA: Insufficient documentation

## 2020-12-11 DIAGNOSIS — F10920 Alcohol use, unspecified with intoxication, uncomplicated: Secondary | ICD-10-CM

## 2020-12-11 DIAGNOSIS — Y92010 Kitchen of single-family (private) house as the place of occurrence of the external cause: Secondary | ICD-10-CM | POA: Insufficient documentation

## 2020-12-11 DIAGNOSIS — R109 Unspecified abdominal pain: Secondary | ICD-10-CM | POA: Insufficient documentation

## 2020-12-11 LAB — LIPASE, BLOOD: Lipase: 31 U/L (ref 11–51)

## 2020-12-11 LAB — COMPREHENSIVE METABOLIC PANEL
ALT: 17 U/L (ref 0–44)
AST: 18 U/L (ref 15–41)
Albumin: 4.2 g/dL (ref 3.5–5.0)
Alkaline Phosphatase: 59 U/L (ref 38–126)
Anion gap: 15 (ref 5–15)
BUN: 22 mg/dL (ref 8–23)
CO2: 26 mmol/L (ref 22–32)
Calcium: 8.7 mg/dL — ABNORMAL LOW (ref 8.9–10.3)
Chloride: 101 mmol/L (ref 98–111)
Creatinine, Ser: 1.24 mg/dL (ref 0.61–1.24)
GFR, Estimated: >60 mL/min (ref >60)
Glucose, Bld: 86 mg/dL (ref 70–99)
Potassium: 3.9 mmol/L (ref 3.5–5.1)
Sodium: 142 mmol/L (ref 135–145)
Total Bilirubin: 0.6 mg/dL (ref 0.3–1.2)
Total Protein: 7 g/dL (ref 6.5–8.1)

## 2020-12-11 LAB — CBC WITH DIFFERENTIAL/PLATELET
Abs Immature Granulocytes: 0.02 10*3/uL (ref 0.00–0.07)
Basophils Absolute: 0.1 10*3/uL (ref 0.0–0.1)
Basophils Relative: 1 %
Eosinophils Absolute: 0.1 10*3/uL (ref 0.0–0.5)
Eosinophils Relative: 2 %
HCT: 37.9 % — ABNORMAL LOW (ref 39.0–52.0)
Hemoglobin: 12.6 g/dL — ABNORMAL LOW (ref 13.0–17.0)
Immature Granulocytes: 0 %
Lymphocytes Relative: 31 %
Lymphs Abs: 2.3 10*3/uL (ref 0.7–4.0)
MCH: 28.3 pg (ref 26.0–34.0)
MCHC: 33.2 g/dL (ref 30.0–36.0)
MCV: 85.2 fL (ref 80.0–100.0)
Monocytes Absolute: 0.5 10*3/uL (ref 0.1–1.0)
Monocytes Relative: 7 %
Neutro Abs: 4.4 10*3/uL (ref 1.7–7.7)
Neutrophils Relative %: 59 %
Platelets: 217 10*3/uL (ref 150–400)
RBC: 4.45 MIL/uL (ref 4.22–5.81)
RDW: 18.4 % — ABNORMAL HIGH (ref 11.5–15.5)
WBC: 7.5 10*3/uL (ref 4.0–10.5)
nRBC: 0 % (ref 0.0–0.2)

## 2020-12-11 LAB — ETHANOL: Alcohol, Ethyl (B): 215 mg/dL — ABNORMAL HIGH (ref <10)

## 2020-12-11 LAB — PROTIME-INR
INR: 1 (ref 0.8–1.2)
Prothrombin Time: 12.6 seconds (ref 11.4–15.2)

## 2020-12-11 LAB — SARS CORONAVIRUS 2 BY RT PCR (HOSPITAL ORDER, PERFORMED IN ~~LOC~~ HOSPITAL LAB): SARS Coronavirus 2: NEGATIVE

## 2020-12-11 MED ORDER — IOHEXOL 300 MG/ML  SOLN
100.0000 mL | Freq: Once | INTRAMUSCULAR | Status: AC | PRN
  Administered 2020-12-11: 100 mL via INTRAVENOUS
  Filled 2020-12-11: qty 100

## 2020-12-11 MED ORDER — SODIUM CHLORIDE 0.9 % IV BOLUS
1000.0000 mL | Freq: Once | INTRAVENOUS | Status: AC
  Administered 2020-12-11: 1000 mL via INTRAVENOUS

## 2020-12-11 MED ORDER — LORAZEPAM 1 MG PO TABS
1.0000 mg | ORAL_TABLET | Freq: Once | ORAL | Status: AC
  Administered 2020-12-11: 1 mg via ORAL
  Filled 2020-12-11: qty 1

## 2020-12-11 MED ORDER — THIAMINE HCL 100 MG PO TABS
100.0000 mg | ORAL_TABLET | Freq: Once | ORAL | Status: AC
  Administered 2020-12-11: 100 mg via ORAL
  Filled 2020-12-11: qty 1

## 2020-12-11 NOTE — ED Triage Notes (Signed)
Pt reports tripped into his 72 inch TV and now has pain under his right eye and pain to his right hand. Pt with slight bruising to right hand. Pt denies LOC

## 2020-12-11 NOTE — ED Provider Notes (Signed)
Ongoing care assigned to Dr. Tamala Julian.  Follow-up on reassessment of the patient after fluids   Delman Kitten, MD 12/11/20 [id number]

## 2020-12-11 NOTE — ED Provider Notes (Signed)
[facility] Emergency Department Provider Note  ____________________________________________   Event Date/Time   First MD Initiated Contact with Patient 12/11/20 1945     (approximate)  I have reviewed the triage vital signs and the nursing notes.   HISTORY  Chief Complaint Head Injury   HPI Miguel [name] is a 60 y.o. male who presents to the emergency department for evaluation of head injury.  Patient is a poor historian and gives different reports to myself and nursing staff.  Potentially, the patient may have been loading a refrigerator with groceries when he possibly hit the anterior aspect of his head on an unknown object.  He denies falling or loss of consciousness at that time.  Other report is that he may have fallen on the kitchen floor and tried to catch himself on the counter.  Patient states that he presented because after hitting his head he felt some dizziness and weakness.  Patient admits to chronic alcohol use, approximately 1/2 pint of vodka per day, states he has not had any today and last drink was around noon yesterday with a bottle of wine.  He reports that he has recently felt some shortness of breath that occurred after the fall.  Patient endorses that he fell while in our facility in the bathroom and needed help to get up.  This potentially occurred prior to provider seeing him in the room, though circumstances of this are also unknown at this time.         Past Medical History:  Diagnosis Date  . Alcohol abuse   . Anxiety   . Asthma   . Family history of breast cancer   . GERD (gastroesophageal reflux disease)   . Gout   . Hx of colonic polyps   . Hypertension   . Kidney stone   . 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 OCD (obsessive compulsive disorder)   . Renal colic     Patient Active Problem List   Diagnosis Date Noted  . Hypotension 11/28/2020  . Genetic testing 07/15/2020  . 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 Right ankle pain 06/26/2020  . Family history of breast cancer   . Hx of colonic polyps   . Hospital discharge follow-up 06/05/2020  . Kidney function test abnormal 06/05/2020  . Abdominal pain 04/12/2020  . Hypertensive urgency 04/12/2020  . Alcohol withdrawal ([place]) 03/04/2020  . Right-sided chest wall pain 02/28/2020  . Alcohol abuse with alcohol-induced mood disorder ([place]) 12/02/2019  . Moderate episode of recurrent major depressive disorder ([place])   . Health care maintenance 10/26/2019  . Right knee pain 10/26/2019  . Generalized anxiety disorder 10/14/2019  . MDD (major depressive disorder), recurrent severe, without psychosis ([place]) 07/27/2019  . Social anxiety disorder 07/27/2019  . Left-sided chest wall pain 06/14/2019  . Alcoholic cirrhosis of liver without ascites ([place]) 06/14/2019  . Alcohol abuse 06/14/2019  . AKI (acute kidney injury) ([place]) 05/27/2019  . Alcohol withdrawal syndrome without complication ([place]) 16/08/9603  . Alcoholic intoxication without complication ([place])   . Sepsis ([place]) 03/08/2019  . Breast lump or mass 10/04/2018  . Sepsis secondary to UTI ([place]) 09/14/2018  . Asthma 07/07/2018  . GERD (gastroesophageal reflux disease) 07/07/2018  . OCD (obsessive compulsive disorder) 07/07/2018  . Self-inflicted laceration of left wrist ([place]) 03/10/2018  . Leg hematoma 12/25/2017  . Suicide and self-inflicted injury by cutting and piercing instrument ([place]) 03/10/2017  . Severe  recurrent major 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 ([place]) 03/09/2017  . Substance induced mood disorder ([place]) 08/15/2016  . Involuntary commitment 08/15/2016  . Alcohol use disorder, severe, dependence ([place]) 02/05/2016  . Hypertension 12/05/2015  . Tachycardia 12/05/2015  . Gout 11/13/2015  . Chronic back pain 05/02/2015    Past Surgical History:  Procedure Laterality Date  . CHOLECYSTECTOMY  2012  . COLONOSCOPY    . COLONOSCOPY WITH PROPOFOL N/A  05/28/2020   Procedure: COLONOSCOPY WITH PROPOFOL;  Surgeon: [name], MD;  Location: [facility] ENDOSCOPY;  Service: Gastroenterology;  Laterality: N/A;  . EXTRACORPOREAL SHOCK WAVE LITHOTRIPSY Left 01/12/2019   Procedure: EXTRACORPOREAL SHOCK WAVE LITHOTRIPSY (ESWL);  Surgeon: Billey Co, MD;  Location: [facility] ORS;  Service: Urology;  Laterality: Left;  . UPPER GI ENDOSCOPY      Prior to Admission medications   Medication Sig Start Date End Date Taking? Authorizing Provider  allopurinol (ZYLOPRIM) 300 MG tablet TAKE ONE TABLET BY MOUTH EVERY DAY 09/18/20   Iloabachie, Chioma E, NP  ascorbic acid (VITAMIN C) 500 MG tablet Take by mouth. 08/01/19   [provider]  citalopram (CELEXA) 20 MG tablet Take 1 tablet (20 mg total) by mouth daily. 03/21/20 03/21/21  Clapacs, Madie Reno, MD  Fluticasone-Salmeterol (ADVAIR DISKUS) 100-50 MCG/DOSE AEPB Inhale 1 puff into the lungs 2 (two) times daily. 03/21/20   Clapacs, Madie Reno, MD  folic acid (FOLVITE) 1 MG tablet Take 1 tablet (1 mg total) by mouth daily. 07/16/20   Iloabachie, Chioma E, NP  lisinopril (ZESTRIL) 40 MG tablet TAKE ONE TABLET BY MOUTH EVERY DAY 09/18/20   Iloabachie, Chioma E, NP  metoprolol tartrate (LOPRESSOR) 25 MG tablet Take 1 tablet (25 mg total) by mouth daily. 11/30/20 12/30/20  Wyvonnia Dusky, MD  pantoprazole (PROTONIX) 40 MG tablet Take 1 tablet (40 mg total) by mouth daily. 10/15/20   Iloabachie, Chioma E, NP  thiamine 100 MG tablet Take 1 tablet (100 mg total) by mouth daily. 10/22/20   Iloabachie, Chioma E, NP  VENTOLIN HFA 108 (90 Base) MCG/ACT inhaler INHALE 2 PUFFS EVERY 4 HOURS AS NEEDED 10/22/20   Iloabachie, Chioma E, NP    Allergies Percocet [oxycodone-acetaminophen]  Family History  Problem Relation Age of Onset  . Alcohol abuse Father   . Breast cancer Mother 57    Social History Social History   Tobacco Use  . Smoking status: Former Smoker    Types: Cigarettes  . Smokeless tobacco: Never Used  . Tobacco  comment: quit 30 years ago  Vaping Use  . Vaping Use: Never used  Substance Use Topics  . Alcohol use: Yes    Alcohol/week: 12.0 standard drinks    Types: 12 Shots of liquor per week  . Drug use: No    Review of Systems Constitutional: No fever/chills Eyes: No visual changes. ENT: No sore throat. Cardiovascular: Denies chest pain. Respiratory: + shortness of breath. Gastrointestinal: + abdominal pain.  No nausea, no vomiting.  No diarrhea.  No constipation. Genitourinary: Negative for dysuria. Musculoskeletal: Negative for back pain. Skin: Negative for rash. Neurological: + headaches, focal weakness, no numbness.  ____________________________________________   PHYSICAL EXAM:  VITAL SIGNS: ED Triage Vitals  Enc Vitals Group     BP 12/11/20 1738 132/75     Pulse Rate 12/11/20 1738 (!) 109     Resp 12/11/20 1738 20     Temp 12/11/20 1738 98 F (36.7 C)     Temp Source 12/11/20 1738 Oral  SpO2 12/11/20 1738 98 %     Weight 12/11/20 1739 185 lb (83.9 kg)     Height 12/11/20 1739 6' (1.829 m)     Head Circumference --      Peak Flow --      Pain Score 12/11/20 1739 8     Pain Loc --      Pain Edu? --      Excl. in [place]? --    Constitutional: Alert and oriented. Well appearing and in no acute distress. Eyes: Conjunctivae are normal. PERRL. EOMI. Head: Mild soft tissue swelling noted over the left supraorbital region.  No ecchymosis, no obvious hematoma. Nose: No congestion/rhinnorhea. Mouth/Throat: Mucous membranes are moist.  Oropharynx non-erythematous. Neck: No stridor.  Mild tenderness to palpation of the midline of the cervical spine, no tenderness to palpation paraspinal region.  Full range of motion. Cardiovascular: Normal rate, regular rhythm. Grossly normal heart sounds.  Good peripheral circulation. Respiratory: Normal respiratory effort.  No retractions. Lungs CTAB. Gastrointestinal: There is tenderness present diffusely across the abdomen.  There is a  large, 6 inch x 3 inch area of ecchymosis over the right side of the abdomen, just inferior to the ribs.  No distention.  No CVA tenderness. Musculoskeletal: No lower extremity tenderness nor edema.  No joint effusions. Neurologic: Patient with mild bilateral hand tremor.  Difficulty finding words at times or remembering events. CNII-XII grossly intact. No gross focal neurologic deficits are appreciated.  Skin:  Skin is warm, dry and intact. Ecchymosis of abdomen as above. Psychiatric: Mood and affect are normal. Speech and behavior are normal.  ____________________________________________   LABS (all labs ordered are listed, but only abnormal results are displayed)  Labs Reviewed  COMPREHENSIVE METABOLIC PANEL - Abnormal; Notable for the following components:      Result Value   Calcium 8.7 (*)    All other components within normal limits  ETHANOL - Abnormal; Notable for the following components:   Alcohol, Ethyl (B) 215 (*)    All other components within normal limits  CBC WITH DIFFERENTIAL/PLATELET - Abnormal; Notable for the following components:   Hemoglobin 12.6 (*)    HCT 37.9 (*)    RDW 18.4 (*)    All other components within normal limits  SARS CORONAVIRUS 2 BY RT PCR (HOSPITAL ORDER, [place] [facility])  LIPASE, BLOOD  PROTIME-INR    ____________________________________________  RADIOLOGY I, Marlana Salvage, personally viewed and evaluated these images (plain radiographs) as part of my medical decision making, as well as reviewing the written report by the radiologist.  Official radiology report(s): DG Chest 1 View  Result Date: 12/11/2020 CLINICAL DATA:  Shortness of breath EXAM: CHEST  1 VIEW COMPARISON:  12/05/2020 FINDINGS: The heart size and mediastinal contours are within normal limits. Both lungs are clear. The visualized skeletal structures are unremarkable. IMPRESSION: Negative. Electronically Signed   By: Rolm Baptise M.D.   On: 12/11/2020  21:25   CT Head Wo Contrast  Result Date: 12/11/2020 CLINICAL DATA:  Tripped pain under the right eye EXAM: CT HEAD WITHOUT CONTRAST CT CERVICAL SPINE WITHOUT CONTRAST TECHNIQUE: Multidetector CT imaging of the head and cervical spine was performed following the standard protocol without intravenous contrast. Multiplanar CT image reconstructions of the cervical spine were also generated. COMPARISON:  CT brain and cervical spine 10/23/2020 FINDINGS: CT HEAD FINDINGS Brain: No acute territorial infarction, hemorrhage, or intracranial mass. Mild atrophy. Stable ventricle size. Minimal chronic small vessel ischemic change of the  white matter Vascular: No hyperdense vessels.  No unexpected calcification Skull: Normal. Negative for fracture or focal lesion. Sinuses/Orbits: No acute finding. Other: Mild left forehead soft tissue swelling CT CERVICAL SPINE FINDINGS Alignment: Straightening of the cervical spine. No subluxation. Facet alignment within normal limits Skull base and vertebrae: No acute fracture. No primary bone lesion or focal pathologic process. Soft tissues and spinal canal: No prevertebral fluid or swelling. No visible canal hematoma. Disc levels: Multiple level degenerative change. Mild disc space narrowing C5-C6 and C6-C7. Upper chest: Negative. Other: None IMPRESSION: 1. No CT evidence for acute intracranial abnormality. 2. Straightening of the cervical spine with degenerative changes. No acute osseous abnormality. Electronically Signed   By: Donavan Foil M.D.   On: 12/11/2020 22:13   CT Cervical Spine Wo Contrast  Result Date: 12/11/2020 CLINICAL DATA:  Tripped pain under the right eye EXAM: CT HEAD WITHOUT CONTRAST CT CERVICAL SPINE WITHOUT CONTRAST TECHNIQUE: Multidetector CT imaging of the head and cervical spine was performed following the standard protocol without intravenous contrast. Multiplanar CT image reconstructions of the cervical spine were also generated. COMPARISON:  CT brain and  cervical spine 10/23/2020 FINDINGS: CT HEAD FINDINGS Brain: No acute territorial infarction, hemorrhage, or intracranial mass. Mild atrophy. Stable ventricle size. Minimal chronic small vessel ischemic change of the white matter Vascular: No hyperdense vessels.  No unexpected calcification Skull: Normal. Negative for fracture or focal lesion. Sinuses/Orbits: No acute finding. Other: Mild left forehead soft tissue swelling CT CERVICAL SPINE FINDINGS Alignment: Straightening of the cervical spine. No subluxation. Facet alignment within normal limits Skull base and vertebrae: No acute fracture. No primary bone lesion or focal pathologic process. Soft tissues and spinal canal: No prevertebral fluid or swelling. No visible canal hematoma. Disc levels: Multiple level degenerative change. Mild disc space narrowing C5-C6 and C6-C7. Upper chest: Negative. Other: None IMPRESSION: 1. No CT evidence for acute intracranial abnormality. 2. Straightening of the cervical spine with degenerative changes. No acute osseous abnormality. Electronically Signed   By: Donavan Foil M.D.   On: 12/11/2020 22:13   CT ABDOMEN PELVIS W CONTRAST  Result Date: 12/11/2020 CLINICAL DATA:  Abdominal trauma EXAM: CT ABDOMEN AND PELVIS WITH CONTRAST TECHNIQUE: Multidetector CT imaging of the abdomen and pelvis was performed using the standard protocol following bolus administration of intravenous contrast. CONTRAST:  155m OMNIPAQUE IOHEXOL 300 MG/ML  SOLN COMPARISON:  CT 04/21/2020 FINDINGS: Lower chest: Lung bases demonstrate no acute consolidation or effusion. Normal cardiac size Hepatobiliary: No focal liver abnormality is seen. Status post cholecystectomy. No biliary dilatation. Pancreas: Unremarkable. No pancreatic ductal dilatation or surrounding inflammatory changes. Spleen: Normal in size without focal abnormality. Adrenals/Urinary Tract: Adrenal glands are normal. Multiple intrarenal stones bilaterally. No hydronephrosis. The urinary  bladder is unremarkable. Stomach/Bowel: Stomach is within normal limits. Appendix not well seen but no right lower quadrant inflammatory process. Sigmoid colon diverticular disease without acute inflammatory change. No evidence of bowel wall thickening, distention, or inflammatory changes. Vascular/Lymphatic: Mild aortic atherosclerosis. No aneurysm. No suspicious nodes. Reproductive: Prostate calcifications. Other: Negative for free air or free fluid. Musculoskeletal: No acute or significant osseous findings. IMPRESSION: 1. No CT evidence for acute intra-abdominal or pelvic abnormality. 2. Multiple bilateral intrarenal stones. No hydronephrosis. 3. Sigmoid colon diverticular disease without acute inflammatory change. Aortic Atherosclerosis (ICD10-I70.0). Electronically Signed   By: KDonavan FoilM.D.   On: 12/11/2020 22:19   ____________________________________________   INITIAL IMPRESSION / ASSESSMENT AND PLAN / ED COURSE  As part of my medical decision making,  I reviewed the following data within the [place] notes reviewed and incorporated, Labs reviewed, Old chart reviewed, Radiograph reviewed, Evaluated by EM attending Dr. Jacqualine Code and Notes from prior ED visits        Patient is a 60 year old male who presents to the emergency department today for evaluation of potential head injury.  Patient is a poor historian and it is not exactly clear how the head injury occurred.  Patient self reports falling while in the bathroom at our facility afterwards, also reporting that he hit his head during that time.  States he feels weak and short of breath.  Patient is an everyday alcohol drinker and has not had anything since 12 noon yesterday reportedly by the patient.  See HPI for further details.  In triage, the patient is mildly tachycardic at 109, otherwise vitals are within normal limits.  Physical exam reveals a soft tissue swelling over the left supraorbital region as well as  abdominal ecchymosis over the right side.  Patient is also noted to have a mild bilateral hand tremor, which the patient attributes to being because he has not anything to drink today.  Patient was seen and examined in conjunction with Dr. Delman Kitten.  Treatment will began with 1 mg of oral Ativan to keep from any potential alcohol related tremors or DTs.  We will also begin with CT of the head and neck, CT with contrast of the abdomen and pelvis.  Laboratory evaluation was initiated with CBC, CMP, PT/INR, lipase, ethanol and Covid swab.  These are significant for an ethanol level of 215, otherwise grossly unremarkable.  Patient was given 1 L bolus of normal saline as well as a thiamine tablet.  He reports much improvement in his symptoms with this hydration.  CT of the head neck, abdomen and pelvis are all negative for acute findings.  Dr. Vladimir Crofts also saw the patient after results were back.  Encouraged patient to have close outpatient follow-up.  Patient stable this time for outpatient therapy.       ____________________________________________   FINAL CLINICAL IMPRESSION(S) / ED DIAGNOSES  Final diagnoses:  Fall, initial encounter  Acute alcoholic intoxication without complication ([place])  Weakness     ED Discharge Orders    None      *Please note:  Miguel [name] was evaluated in Emergency Department on 12/11/2020 for the symptoms described in the history of present illness. He was evaluated in the context of the global COVID-19 pandemic, which necessitated consideration that the patient might be at risk for infection with the SARS-CoV-2 virus that causes COVID-19. Institutional protocols and algorithms that pertain to the evaluation of patients at risk for COVID-19 are in a state of rapid change based on information released by regulatory bodies including the CDC and federal and state organizations. These policies and algorithms were followed during the patient's care in the ED.  Some ED  evaluations and interventions may be delayed as a result of limited staffing during and the pandemic.*   Note:  This document was prepared using Dragon voice recognition software and may include unintentional dictation errors.   Marlana Salvage, PA 12/12/20 Benay Pillow    Delman Kitten, MD 12/20/20 [phone]

## 2020-12-11 NOTE — ED Provider Notes (Signed)
Medical screening examination/treatment/procedure(s) were conducted as a shared visit with non-physician practitioner(s) and myself.  I personally evaluated the patient during the encounter.    I personally saw and evaluated the patient with physician assistant this time.  He is a somewhat poor historian, but reports that he is having for falls at home recently struck his head on the refrigerator door as he was organizing it.  He also reports he started feeling "shaky" today normally drinks about a half a pint of hard liquor daily and also sometimes a bottle wine  His last drink was yesterday.  He reports has been feeling fatigued nauseated and has had multiple falls.  He also reports that he was going to use the bathroom here and got lightheaded and felt like he was in a pass out and fell as well.  Exam notable for small contusion slight hematoma over the left superior occipital region no bleeding.  Additionally he has tenderness throughout mildly on abdominal exam but notable contusion and bruising over the right mid abdomen which she reports is from another fall.  Slight bruising over the right hand with good full range of motion.  He also has slight tremulousness in his hands and mild tachycardia.  I plan at this time is to obtain CT of the head, abdomen pelvis, chest x-ray, Covid test, CBC, CMP, lipase, and provide small dose of Ativan as well for potential early alcohol withdrawal tremors.  Differential diagnosis broad, but I have concerns that patient needs labs, imaging, etc.    Delman Kitten, MD 12/11/20 2051

## 2020-12-16 ENCOUNTER — Telehealth: Payer: Self-pay | Admitting: Gerontology

## 2020-12-16 NOTE — Telephone Encounter (Signed)
lmom to inform patient of office visit on 2/17 at 12 pm per staff msg

## 2020-12-18 ENCOUNTER — Emergency Department: Payer: Self-pay

## 2020-12-18 ENCOUNTER — Emergency Department
Admission: EM | Admit: 2020-12-18 | Discharge: 2020-12-18 | Disposition: A | Discharge status: Home / Self Care | Payer: Self-pay | Attending: Emergency Medicine | Admitting: Emergency Medicine

## 2020-12-18 ENCOUNTER — Other Ambulatory Visit: Payer: Self-pay

## 2020-12-18 DIAGNOSIS — Z79899 Other long term (current) drug therapy: Secondary | ICD-10-CM | POA: Insufficient documentation

## 2020-12-18 DIAGNOSIS — Z87891 Personal history of nicotine dependence: Secondary | ICD-10-CM | POA: Insufficient documentation

## 2020-12-18 DIAGNOSIS — F10129 Alcohol abuse with intoxication, unspecified: Secondary | ICD-10-CM | POA: Insufficient documentation

## 2020-12-18 DIAGNOSIS — M542 Cervicalgia: Secondary | ICD-10-CM | POA: Insufficient documentation

## 2020-12-18 DIAGNOSIS — F10920 Alcohol use, unspecified with intoxication, uncomplicated: Secondary | ICD-10-CM

## 2020-12-18 DIAGNOSIS — W19XXXA Unspecified fall, initial encounter: Secondary | ICD-10-CM

## 2020-12-18 DIAGNOSIS — I10 Essential (primary) hypertension: Secondary | ICD-10-CM | POA: Insufficient documentation

## 2020-12-18 DIAGNOSIS — J45909 Unspecified asthma, uncomplicated: Secondary | ICD-10-CM | POA: Insufficient documentation

## 2020-12-18 DIAGNOSIS — R0789 Other chest pain: Secondary | ICD-10-CM | POA: Insufficient documentation

## 2020-12-18 DIAGNOSIS — Y92009 Unspecified place in unspecified non-institutional (private) residence as the place of occurrence of the external cause: Secondary | ICD-10-CM | POA: Insufficient documentation

## 2020-12-18 DIAGNOSIS — W0110XA Fall on same level from slipping, tripping and stumbling with subsequent striking against unspecified object, initial encounter: Secondary | ICD-10-CM | POA: Insufficient documentation

## 2020-12-18 DIAGNOSIS — Y908 Blood alcohol level of 240 mg/100 ml or more: Secondary | ICD-10-CM | POA: Insufficient documentation

## 2020-12-18 LAB — COMPREHENSIVE METABOLIC PANEL
ALT: 15 U/L (ref 0–44)
AST: 17 U/L (ref 15–41)
Albumin: 3.8 g/dL (ref 3.5–5.0)
Alkaline Phosphatase: 56 U/L (ref 38–126)
Anion gap: 14 (ref 5–15)
BUN: 14 mg/dL (ref 8–23)
CO2: 23 mmol/L (ref 22–32)
Calcium: 8.7 mg/dL — ABNORMAL LOW (ref 8.9–10.3)
Chloride: 111 mmol/L (ref 98–111)
Creatinine, Ser: 0.93 mg/dL (ref 0.61–1.24)
GFR, Estimated: >60 mL/min (ref >60)
Glucose, Bld: 112 mg/dL — ABNORMAL HIGH (ref 70–99)
Potassium: 3.9 mmol/L (ref 3.5–5.1)
Sodium: 148 mmol/L — ABNORMAL HIGH (ref 135–145)
Total Bilirubin: 0.6 mg/dL (ref 0.3–1.2)
Total Protein: 6.4 g/dL — ABNORMAL LOW (ref 6.5–8.1)

## 2020-12-18 LAB — ETHANOL: Alcohol, Ethyl (B): 368 mg/dL (ref <10)

## 2020-12-18 LAB — CBC WITH DIFFERENTIAL/PLATELET
Abs Immature Granulocytes: 0.03 10*3/uL (ref 0.00–0.07)
Basophils Absolute: 0.1 10*3/uL (ref 0.0–0.1)
Basophils Relative: 1 %
Eosinophils Absolute: 0.2 10*3/uL (ref 0.0–0.5)
Eosinophils Relative: 2 %
HCT: 39.3 % (ref 39.0–52.0)
Hemoglobin: 12.3 g/dL — ABNORMAL LOW (ref 13.0–17.0)
Immature Granulocytes: 0 %
Lymphocytes Relative: 28 %
Lymphs Abs: 2 10*3/uL (ref 0.7–4.0)
MCH: 27.3 pg (ref 26.0–34.0)
MCHC: 31.3 g/dL (ref 30.0–36.0)
MCV: 87.3 fL (ref 80.0–100.0)
Monocytes Absolute: 0.7 10*3/uL (ref 0.1–1.0)
Monocytes Relative: 9 %
Neutro Abs: 4.1 10*3/uL (ref 1.7–7.7)
Neutrophils Relative %: 60 %
Platelets: 180 10*3/uL (ref 150–400)
RBC: 4.5 MIL/uL (ref 4.22–5.81)
RDW: 18.7 % — ABNORMAL HIGH (ref 11.5–15.5)
WBC: 7 10*3/uL (ref 4.0–10.5)
nRBC: 0 % (ref 0.0–0.2)

## 2020-12-18 MED ORDER — LACTATED RINGERS IV BOLUS
1000.0000 mL | Freq: Once | INTRAVENOUS | Status: AC
  Administered 2020-12-18: 1000 mL via INTRAVENOUS

## 2020-12-18 NOTE — ED Notes (Signed)
Date and time results received: 12/18/20 0759   Test: ETOH Critical Value: 368  Name of Provider Notified: Charna Archer  Orders Received? Or Actions Taken?: Acknowledged

## 2020-12-18 NOTE — ED Triage Notes (Signed)
Pt arrives [facility] from home w witnessed fall. Pt reports 8/10 R rib pain. Per wife pt hit head and is not on blood thinners. ETOH on board.

## 2020-12-18 NOTE — ED Notes (Signed)
Pt able to ambulate. Taxi called for patient at time of discharge. Pt given meal tray as well. E-signature not working at this time. Pt verbalized understanding of D/C instructions, prescriptions and follow up care with no further questions at this time. Pt in NAD and ambulatory at time of D/C.

## 2020-12-18 NOTE — ED Provider Notes (Signed)
[facility] Emergency Department Provider Note   ____________________________________________   Event Date/Time   First MD Initiated Contact with Patient 12/18/20 [phone]     (approximate)  I have reviewed the triage vital signs and the nursing notes.   HISTORY  Chief Complaint Fall    HPI Miguel [name] is a 60 y.o. male with past medical history of hypertension, gout, GERD, asthma, and alcohol abuse who presents to the ED for fall.  History is limited as patient is heavily intoxicated.  Per EMS, patient had a fall witnessed by his wife where he hit his head but did not lose consciousness.  He is not anticoagulated.  He complains of pain along the right side of his chest as well as pain in his neck.  He denies any pain in his extremities or pelvis.  He admits to alcohol consumption, but is unable to state how much.  He is also unable to state exactly how he fell.        Past Medical History:  Diagnosis Date  . Alcohol abuse   . Anxiety   . Asthma   . Family history of breast cancer   . GERD (gastroesophageal reflux disease)   . Gout   . Hx of colonic polyps   . Hypertension   . Kidney stone   . 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 OCD (obsessive compulsive disorder)   . Renal colic     Patient Active Problem List   Diagnosis Date Noted  . Hypotension 11/28/2020  . Genetic testing 07/15/2020  . 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 Right ankle pain 06/26/2020  . Family history of breast cancer   . Hx of colonic polyps   . Hospital discharge follow-up 06/05/2020  . Kidney function test abnormal 06/05/2020  . Abdominal pain 04/12/2020  . Hypertensive urgency 04/12/2020  . Alcohol withdrawal ([place]) 03/04/2020  . Right-sided chest wall pain 02/28/2020  . Alcohol abuse with alcohol-induced mood disorder ([place]) 12/02/2019  . Moderate episode of recurrent major depressive disorder ([place])   . Health care maintenance  10/26/2019  . Right knee pain 10/26/2019  . Generalized anxiety disorder 10/14/2019  . MDD (major depressive disorder), recurrent severe, without psychosis ([place]) 07/27/2019  . Social anxiety disorder 07/27/2019  . Left-sided chest wall pain 06/14/2019  . Alcoholic cirrhosis of liver without ascites ([place]) 06/14/2019  . Alcohol abuse 06/14/2019  . AKI (acute kidney injury) ([place]) 05/27/2019  . Alcohol withdrawal syndrome without complication ([place]) 40/37/0964  . Alcoholic intoxication without complication ([place])   . Sepsis ([place]) 03/08/2019  . Breast lump or mass 10/04/2018  . Sepsis secondary to UTI ([place]) 09/14/2018  . Asthma 07/07/2018  . GERD (gastroesophageal reflux disease) 07/07/2018  . OCD (obsessive compulsive disorder) 07/07/2018  . Self-inflicted laceration of left wrist ([place]) 03/10/2018  . Leg hematoma 12/25/2017  . Suicide and self-inflicted injury by cutting and piercing instrument ([place]) 03/10/2017  . Severe recurrent major 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 ([place]) 03/09/2017  . Substance induced mood disorder ([place]) 08/15/2016  . Involuntary commitment 08/15/2016  . Alcohol use disorder, severe, dependence ([place]) 02/05/2016  . Hypertension 12/05/2015  . Tachycardia 12/05/2015  . Gout 11/13/2015  . Chronic back pain 05/02/2015    Past Surgical History:  Procedure Laterality Date  . CHOLECYSTECTOMY  2012  . COLONOSCOPY    . COLONOSCOPY WITH PROPOFOL N/A 05/28/2020   Procedure: COLONOSCOPY WITH PROPOFOL;  Surgeon: [name],  MD;  Location: [facility] ENDOSCOPY;  Service: Gastroenterology;  Laterality: N/A;  . EXTRACORPOREAL SHOCK WAVE LITHOTRIPSY Left 01/12/2019   Procedure: EXTRACORPOREAL SHOCK WAVE LITHOTRIPSY (ESWL);  Surgeon: Billey Co, MD;  Location: [facility] ORS;  Service: Urology;  Laterality: Left;  . UPPER GI ENDOSCOPY      Prior to Admission medications   Medication Sig Start Date End Date Taking? Authorizing Provider  allopurinol (ZYLOPRIM) 300 MG tablet TAKE  ONE TABLET BY MOUTH EVERY DAY 09/18/20   Iloabachie, Chioma E, NP  ascorbic acid (VITAMIN C) 500 MG tablet Take by mouth. 08/01/19   [provider]  citalopram (CELEXA) 20 MG tablet Take 1 tablet (20 mg total) by mouth daily. 03/21/20 03/21/21  Clapacs, Madie Reno, MD  Fluticasone-Salmeterol (ADVAIR DISKUS) 100-50 MCG/DOSE AEPB Inhale 1 puff into the lungs 2 (two) times daily. 03/21/20   Clapacs, Madie Reno, MD  folic acid (FOLVITE) 1 MG tablet Take 1 tablet (1 mg total) by mouth daily. 07/16/20   Iloabachie, Chioma E, NP  lisinopril (ZESTRIL) 40 MG tablet TAKE ONE TABLET BY MOUTH EVERY DAY 09/18/20   Iloabachie, Chioma E, NP  metoprolol tartrate (LOPRESSOR) 25 MG tablet Take 1 tablet (25 mg total) by mouth daily. 11/30/20 12/30/20  Wyvonnia Dusky, MD  pantoprazole (PROTONIX) 40 MG tablet Take 1 tablet (40 mg total) by mouth daily. 10/15/20   Iloabachie, Chioma E, NP  thiamine 100 MG tablet Take 1 tablet (100 mg total) by mouth daily. 10/22/20   Iloabachie, Chioma E, NP  VENTOLIN HFA 108 (90 Base) MCG/ACT inhaler INHALE 2 PUFFS EVERY 4 HOURS AS NEEDED 10/22/20   Iloabachie, Chioma E, NP    Allergies Percocet [oxycodone-acetaminophen]  Family History  Problem Relation Age of Onset  . Alcohol abuse Father   . Breast cancer Mother 12    Social History Social History   Tobacco Use  . Smoking status: Former Smoker    Types: Cigarettes  . Smokeless tobacco: Never Used  . Tobacco comment: quit 30 years ago  Vaping Use  . Vaping Use: Never used  Substance Use Topics  . Alcohol use: Yes    Alcohol/week: 12.0 standard drinks    Types: 12 Shots of liquor per week  . Drug use: No    Review of Systems  Constitutional: No fever/chills Eyes: No visual changes. ENT: No sore throat. Cardiovascular: Positive for chest wall pain. Respiratory: Denies shortness of breath. Gastrointestinal: No abdominal pain.  No nausea, no vomiting.  No diarrhea.  No constipation. Genitourinary: Negative for  dysuria. Musculoskeletal: Negative for back pain. Skin: Negative for rash. Neurological: Negative for headaches, focal weakness or numbness.  ____________________________________________   PHYSICAL EXAM:  VITAL SIGNS: ED Triage Vitals  Enc Vitals Group     BP      Pulse      Resp      Temp      Temp src      SpO2      Weight      Height      Head Circumference      Peak Flow      Pain Score      Pain Loc      Pain Edu?      Excl. in [place]?     Constitutional: Alert and oriented.  Intoxicated appearing. Eyes: Conjunctivae are normal. Head: Atraumatic. Nose: No congestion/rhinnorhea. Mouth/Throat: Mucous membranes are moist. Neck: Normal ROM Cardiovascular: Normal rate, regular rhythm. Grossly normal heart sounds. Respiratory: Normal respiratory  effort.  No retractions. Lungs CTAB.  Right chest wall tenderness to palpation with no ecchymosis. Gastrointestinal: Soft and nontender. No distention. Genitourinary: deferred Musculoskeletal: No lower extremity tenderness nor edema. Neurologic: Slurred speech noted. No gross focal neurologic deficits are appreciated. Skin:  Skin is warm, dry and intact. No rash noted. Psychiatric: Mood and affect are normal. Speech and behavior are normal.  ____________________________________________   LABS (all labs ordered are listed, but only abnormal results are displayed)  Labs Reviewed  CBC WITH DIFFERENTIAL/PLATELET - Abnormal; Notable for the following components:      Result Value   Hemoglobin 12.3 (*)    RDW 18.7 (*)    All other components within normal limits  COMPREHENSIVE METABOLIC PANEL - Abnormal; Notable for the following components:   Sodium 148 (*)    Glucose, Bld 112 (*)    Calcium 8.7 (*)    Total Protein 6.4 (*)    All other components within normal limits  ETHANOL - Abnormal; Notable for the following components:   Alcohol, Ethyl (B) 368 (*)    All other components within normal limits    ____________________________________________  EKG  ED ECG REPORT I, Blake Divine, the attending physician, personally viewed and interpreted this ECG.   Date: 12/18/2020  EKG Time: 7:29  Rate: 85  Rhythm: normal sinus rhythm  Axis: Normal  Intervals:none  ST&T Change: None   PROCEDURES  Procedure(s) performed (including Critical Care):  Procedures   ____________________________________________   INITIAL IMPRESSION / ASSESSMENT AND PLAN / ED COURSE       60 year old male with past medical history of hypertension, gout, GERD, asthma, and alcohol abuse who presents to the ED following fall witnessed by his wife at home.  Patient arrives significantly intoxicated and is a regular visitor to the ED for alcohol intoxication.  History is limited due to his level of intoxication but he does complain of right chest wall pain.  We will further assess with chest x-ray as well as CT head and cervical spine.  We will screen labs and EKG given unclear context of fall.  EKG shows no evidence of arrhythmia or ischemia, labs remarkable for markedly elevated blood alcohol level but are otherwise reassuring.  Patient has a long history of alcohol dependence, is now awake and alert, acting appropriately.  He is appropriate for discharge home to the care of his wife.      ____________________________________________   FINAL CLINICAL IMPRESSION(S) / ED DIAGNOSES  Final diagnoses:  Alcoholic intoxication without complication ([place])  Fall, initial encounter     ED Discharge Orders    None       Note:  This document was prepared using Dragon voice recognition software and may include unintentional dictation errors.   Blake Divine, MD 12/18/20 1247

## 2020-12-20 ENCOUNTER — Encounter: Payer: Self-pay | Admitting: Radiology

## 2020-12-20 ENCOUNTER — Emergency Department: Payer: Self-pay

## 2020-12-20 ENCOUNTER — Emergency Department
Admission: EM | Admit: 2020-12-20 | Discharge: 2020-12-20 | Disposition: A | Discharge status: Home / Self Care | Payer: Self-pay | Attending: Emergency Medicine | Admitting: Emergency Medicine

## 2020-12-20 DIAGNOSIS — Z7951 Long term (current) use of inhaled steroids: Secondary | ICD-10-CM | POA: Insufficient documentation

## 2020-12-20 DIAGNOSIS — W19XXXA Unspecified fall, initial encounter: Secondary | ICD-10-CM

## 2020-12-20 DIAGNOSIS — Z87891 Personal history of nicotine dependence: Secondary | ICD-10-CM | POA: Insufficient documentation

## 2020-12-20 DIAGNOSIS — Z79899 Other long term (current) drug therapy: Secondary | ICD-10-CM | POA: Insufficient documentation

## 2020-12-20 DIAGNOSIS — I10 Essential (primary) hypertension: Secondary | ICD-10-CM | POA: Insufficient documentation

## 2020-12-20 DIAGNOSIS — R0789 Other chest pain: Secondary | ICD-10-CM | POA: Insufficient documentation

## 2020-12-20 DIAGNOSIS — K219 Gastro-esophageal reflux disease without esophagitis: Secondary | ICD-10-CM | POA: Insufficient documentation

## 2020-12-20 DIAGNOSIS — N200 Calculus of kidney: Secondary | ICD-10-CM

## 2020-12-20 DIAGNOSIS — J45909 Unspecified asthma, uncomplicated: Secondary | ICD-10-CM | POA: Insufficient documentation

## 2020-12-20 LAB — CBC
HCT: 36.6 % — ABNORMAL LOW (ref 39.0–52.0)
Hemoglobin: 11.7 g/dL — ABNORMAL LOW (ref 13.0–17.0)
MCH: 27.6 pg (ref 26.0–34.0)
MCHC: 32 g/dL (ref 30.0–36.0)
MCV: 86.3 fL (ref 80.0–100.0)
Platelets: 165 10*3/uL (ref 150–400)
RBC: 4.24 MIL/uL (ref 4.22–5.81)
RDW: 17.7 % — ABNORMAL HIGH (ref 11.5–15.5)
WBC: 7.7 10*3/uL (ref 4.0–10.5)
nRBC: 0 % (ref 0.0–0.2)

## 2020-12-20 LAB — COMPREHENSIVE METABOLIC PANEL
ALT: 24 U/L (ref 0–44)
AST: 29 U/L (ref 15–41)
Albumin: 4.1 g/dL (ref 3.5–5.0)
Alkaline Phosphatase: 68 U/L (ref 38–126)
Anion gap: 14 (ref 5–15)
BUN: 16 mg/dL (ref 8–23)
CO2: 18 mmol/L — ABNORMAL LOW (ref 22–32)
Calcium: 8.2 mg/dL — ABNORMAL LOW (ref 8.9–10.3)
Chloride: 103 mmol/L (ref 98–111)
Creatinine, Ser: 1.09 mg/dL (ref 0.61–1.24)
GFR, Estimated: >60 mL/min (ref >60)
Glucose, Bld: 106 mg/dL — ABNORMAL HIGH (ref 70–99)
Potassium: 3.1 mmol/L — ABNORMAL LOW (ref 3.5–5.1)
Sodium: 135 mmol/L (ref 135–145)
Total Bilirubin: 1.8 mg/dL — ABNORMAL HIGH (ref 0.3–1.2)
Total Protein: 7 g/dL (ref 6.5–8.1)

## 2020-12-20 MED ORDER — ONDANSETRON HCL 4 MG/2ML IJ SOLN
4.0000 mg | Freq: Once | INTRAMUSCULAR | Status: AC
  Administered 2020-12-20: 4 mg via INTRAVENOUS
  Filled 2020-12-20: qty 2

## 2020-12-20 MED ORDER — SODIUM CHLORIDE 0.9 % IV BOLUS
1000.0000 mL | Freq: Once | INTRAVENOUS | Status: AC
  Administered 2020-12-20: 1000 mL via INTRAVENOUS

## 2020-12-20 MED ORDER — IOHEXOL 300 MG/ML  SOLN
100.0000 mL | Freq: Once | INTRAMUSCULAR | Status: AC | PRN
  Administered 2020-12-20: 100 mL via INTRAVENOUS

## 2020-12-20 NOTE — Discharge Instructions (Addendum)
Please seek medical attention for any high fevers, chest pain, shortness of breath, change in behavior, persistent vomiting, bloody stool or any other new or concerning symptoms.  

## 2020-12-20 NOTE — ED Notes (Signed)
SIGNATURE AT DISCHARGE OBTAINED VIA HARDCOPY

## 2020-12-20 NOTE — ED Notes (Signed)
Pt awake and alert- confirms mech fall 1 night prior to arrival -- pt reports pain to posterior scalp/head from fall with multiple contusions to L anterior and lower thorax regions -- pt also noted to have abrasions to BUE.  No active bleeding or bony deformities noted.  Denies dizziness.  RR even and unlabored on RA with symmetrical rise and fall of chest.  1L NS bolus now infusing via gravity to 20G L AC; dressing dry and intact as pt awaits CT abd/pelvis - pt updated on plan and is agreeable.  Will monitor for acute changes and maintain plan of care.

## 2020-12-20 NOTE — ED Triage Notes (Signed)
Pt states that he fell again last pm, states that his foot got caught on the bottom of the mat on his ramp, pt reports that he fell forward and struck the left side of his chest on the rail, pt has a large amount of bruising under his left chest area. Pt states he thinks he was knocked out, pt reports some soreness to his head, reports bilat knee pain, and right ankle pain. Reports that it hurts to breathe, states that he hasn't eaten anything yesterday or today, states that he has drank about a half a gallon of water in the past 24 hours. Pt asked about how he feels regarding dt's, pt holds his hands out in front of him and has obvious tremors noted

## 2020-12-20 NOTE — ED Notes (Signed)
Pt agreeable with d/c plan as discussed by provider- this nurse has verbally reinforced d/c instructions and provided pt with written copy - pt acknowledges verbal understanding; denies any additional questions, concerns, needs  

## 2020-12-20 NOTE — ED Provider Notes (Signed)
[facility] Emergency Department Provider Note __________________________________________   I have reviewed the triage vital signs and the nursing notes.   HISTORY  Chief Complaint Fall   History limited by: Not Limited   HPI Miguel [name] is a 60 y.o. male who presents to the emergency department today after mechanical fall that he suffered yesterday.  Patient states he caught his foot on a rubber mat on the ramp at his house.  He states he fell onto his left side.  He says that he thought he would see how he could manage it at home however he continued to have pain today he felt like he should be evaluated.  Does complain of significant pain to his left lower chest and left upper abdomen.  States it does make it hard for him to take a full breath.  He also states he has had decreased appetite presents with fall.   Records reviewed. Per medical record review patient has a history of alcohol abuse. Was seen in the ed two days ago after a fall.   Past Medical History:  Diagnosis Date  . Alcohol abuse   . Anxiety   . Asthma   . Family history of breast cancer   . GERD (gastroesophageal reflux disease)   . Gout   . Hx of colonic polyps   . Hypertension   . Kidney stone   . 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 OCD (obsessive compulsive disorder)   . Renal colic     Patient Active Problem List   Diagnosis Date Noted  . Hypotension 11/28/2020  . Genetic testing 07/15/2020  . 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 Right ankle pain 06/26/2020  . Family history of breast cancer   . Hx of colonic polyps   . Hospital discharge follow-up 06/05/2020  . Kidney function test abnormal 06/05/2020  . Abdominal pain 04/12/2020  . Hypertensive urgency 04/12/2020  . Alcohol withdrawal ([place]) 03/04/2020  . Right-sided chest wall pain 02/28/2020  . Alcohol abuse with alcohol-induced mood disorder ([place]) 12/02/2019  . Moderate episode  of recurrent major depressive disorder ([place])   . Health care maintenance 10/26/2019  . Right knee pain 10/26/2019  . Generalized anxiety disorder 10/14/2019  . MDD (major depressive disorder), recurrent severe, without psychosis ([place]) 07/27/2019  . Social anxiety disorder 07/27/2019  . Left-sided chest wall pain 06/14/2019  . Alcoholic cirrhosis of liver without ascites ([place]) 06/14/2019  . Alcohol abuse 06/14/2019  . AKI (acute kidney injury) ([place]) 05/27/2019  . Alcohol withdrawal syndrome without complication ([place]) 79/39/0300  . Alcoholic intoxication without complication ([place])   . Sepsis ([place]) 03/08/2019  . Breast lump or mass 10/04/2018  . Sepsis secondary to UTI ([place]) 09/14/2018  . Asthma 07/07/2018  . GERD (gastroesophageal reflux disease) 07/07/2018  . OCD (obsessive compulsive disorder) 07/07/2018  . Self-inflicted laceration of left wrist ([place]) 03/10/2018  . Leg hematoma 12/25/2017  . Suicide and self-inflicted injury by cutting and piercing instrument ([place]) 03/10/2017  . Severe recurrent major 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 ([place]) 03/09/2017  . Substance induced mood disorder ([place]) 08/15/2016  . Involuntary commitment 08/15/2016  . Alcohol use disorder, severe, dependence ([place]) 02/05/2016  . Hypertension 12/05/2015  . Tachycardia 12/05/2015  . Gout 11/13/2015  . Chronic back pain 05/02/2015    Past Surgical History:  Procedure Laterality Date  . CHOLECYSTECTOMY  2012  . COLONOSCOPY    . COLONOSCOPY WITH PROPOFOL N/A 05/28/2020  Procedure: COLONOSCOPY WITH PROPOFOL;  Surgeon: [name], MD;  Location: [facility] ENDOSCOPY;  Service: Gastroenterology;  Laterality: N/A;  . EXTRACORPOREAL SHOCK WAVE LITHOTRIPSY Left 01/12/2019   Procedure: EXTRACORPOREAL SHOCK WAVE LITHOTRIPSY (ESWL);  Surgeon: Billey Co, MD;  Location: [facility] ORS;  Service: Urology;  Laterality: Left;  . UPPER GI ENDOSCOPY      Prior to Admission medications   Medication Sig Start Date End Date  Taking? Authorizing Provider  allopurinol (ZYLOPRIM) 300 MG tablet TAKE ONE TABLET BY MOUTH EVERY DAY 09/18/20   Iloabachie, Chioma E, NP  ascorbic acid (VITAMIN C) 500 MG tablet Take by mouth. 08/01/19   [provider]  citalopram (CELEXA) 20 MG tablet Take 1 tablet (20 mg total) by mouth daily. 03/21/20 03/21/21  Clapacs, Madie Reno, MD  Fluticasone-Salmeterol (ADVAIR DISKUS) 100-50 MCG/DOSE AEPB Inhale 1 puff into the lungs 2 (two) times daily. 03/21/20   Clapacs, Madie Reno, MD  folic acid (FOLVITE) 1 MG tablet Take 1 tablet (1 mg total) by mouth daily. 07/16/20   Iloabachie, Chioma E, NP  lisinopril (ZESTRIL) 40 MG tablet TAKE ONE TABLET BY MOUTH EVERY DAY 09/18/20   Iloabachie, Chioma E, NP  metoprolol tartrate (LOPRESSOR) 25 MG tablet Take 1 tablet (25 mg total) by mouth daily. 11/30/20 12/30/20  Wyvonnia Dusky, MD  pantoprazole (PROTONIX) 40 MG tablet Take 1 tablet (40 mg total) by mouth daily. 10/15/20   Iloabachie, Chioma E, NP  thiamine 100 MG tablet Take 1 tablet (100 mg total) by mouth daily. 10/22/20   Iloabachie, Chioma E, NP  VENTOLIN HFA 108 (90 Base) MCG/ACT inhaler INHALE 2 PUFFS EVERY 4 HOURS AS NEEDED 10/22/20   Iloabachie, Chioma E, NP    Allergies Percocet [oxycodone-acetaminophen]  Family History  Problem Relation Age of Onset  . Alcohol abuse Father   . Breast cancer Mother 30    Social History Social History   Tobacco Use  . Smoking status: Former Smoker    Types: Cigarettes  . Smokeless tobacco: Never Used  . Tobacco comment: quit 30 years ago  Vaping Use  . Vaping Use: Never used  Substance Use Topics  . Alcohol use: Yes    Alcohol/week: 12.0 standard drinks    Types: 12 Shots of liquor per week  . Drug use: No    Review of Systems Constitutional: No fever/chills Eyes: No visual changes. ENT: No sore throat. Cardiovascular: Positive for left lower abdominal pain. Respiratory: Positive for shortness of breath. Gastrointestinal: Positive for left upper  abdominal pain, positive for decreased oral intake.  Genitourinary: Negative for dysuria. Musculoskeletal: Positive for bilateral elbow and knee pain. Skin: Positive for bruising to left torso.  Neurological: Negative for headaches, focal weakness or numbness.  ____________________________________________   PHYSICAL EXAM:  VITAL SIGNS: ED Triage Vitals  Enc Vitals Group     BP 12/20/20 1654 (!) 168/112     Pulse Rate 12/20/20 1654 (!) 110     Resp 12/20/20 1654 18     Temp 12/20/20 1654 98.7 F (37.1 C)     Temp Source 12/20/20 1654 Oral     SpO2 12/20/20 1654 96 %     Weight 12/20/20 1655 175 lb (79.4 kg)     Height 12/20/20 1655 6' (1.829 m)     Head Circumference --      Peak Flow --      Pain Score 12/20/20 1708 9   Constitutional: Alert and oriented.  Eyes: Conjunctivae are normal.  ENT  Head: Normocephalic and atraumatic.      Nose: No congestion/rhinnorhea.      Mouth/Throat: Mucous membranes are moist.      Neck: No stridor. Hematological/Lymphatic/Immunilogical: No cervical lymphadenopathy. Cardiovascular: Normal rate, regular rhythm.  No murmurs, rubs, or gallops.  Respiratory: Normal respiratory effort without tachypnea nor retractions. Breath sounds are clear and equal bilaterally. No wheezes/rales/rhonchi. Gastrointestinal: Soft and non tender. No rebound. No guarding.  Genitourinary: Deferred Musculoskeletal: Normal range of motion in all extremities. No lower extremity edema. Neurologic:  Normal speech and language. No gross focal neurologic deficits are appreciated.  Skin:  Large bruise over left lower chest and upper abdomen. Bruise to left lower abdomen.  Psychiatric: Mood and affect are normal. Speech and behavior are normal. Patient exhibits appropriate insight and judgment.  ____________________________________________    LABS (pertinent positives/negatives)  CBC wbc 7.7, hgb 11.7, plt 165 CMP na 135, k 3.1, glu 106, cr  1.09  ____________________________________________   EKG  None  ____________________________________________    RADIOLOGY  CXR No acute process  CT abd/pel No solid organ injury. Left sided ureteral stone. ____________________________________________   PROCEDURES  Procedures  ____________________________________________   INITIAL IMPRESSION / ASSESSMENT AND PLAN / ED COURSE  Pertinent labs & imaging results that were available during my care of the patient were reviewed by me and considered in my medical decision making (see chart for details).  Patient presented to the emergency department today because of concerns for pain after a fall.  On exam patient had a large amount of bruising to left lower chest left upper quadrant and left lower quadrant.  Chest x-ray without any acute rib fractures or pneumothorax.  I did obtain a CT abdomen pelvis to evaluate for solid organ injury and this was negative.  It did however show possible kidney stone left ureter.  I discussed this with the patient he has had kidney stones in the past.  ____________________________________________   FINAL CLINICAL IMPRESSION(S) / ED DIAGNOSES  Final diagnoses:  Fall, initial encounter  Kidney stone     Note: This dictation was prepared with Dragon dictation. Any transcriptional errors that result from this process are unintentional     Nance Pear, MD 12/20/20 2306

## 2020-12-20 NOTE — ED Notes (Signed)
Pt escorted to waiting room via w/c to await taxi - ETA 20 minutes

## 2020-12-20 NOTE — ED Notes (Signed)
Pt now to CT dept via stretcher; awake and alert- no acute changes

## 2020-12-20 NOTE — ED Notes (Signed)
ED Provider at bedside. 

## 2020-12-21 IMAGING — CT CT CERVICAL SPINE W/O CM
3 of 4 series · 12 of 33 positions shown, 14 images · non-contrast
Comparison: None.

CLINICAL DATA: Acute pain due to trauma.

EXAM:
CT HEAD WITHOUT CONTRAST
CT CERVICAL SPINE WITHOUT CONTRAST
TECHNIQUE: Multidetector CT imaging of the head and cervical spine was
performed following the standard protocol without intravenous
contrast. Multiplanar CT image reconstructions of the cervical spine
were also generated.

[Series 4: sagittal bone · sagittal · 0.26mm/px · 5 of 70 slices shown, 6 images]
[im 24/70  bone]
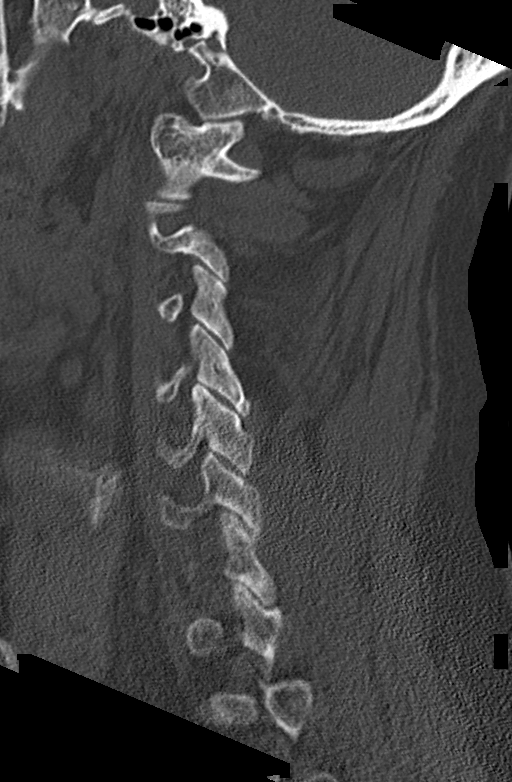
[im 29/70  bone]
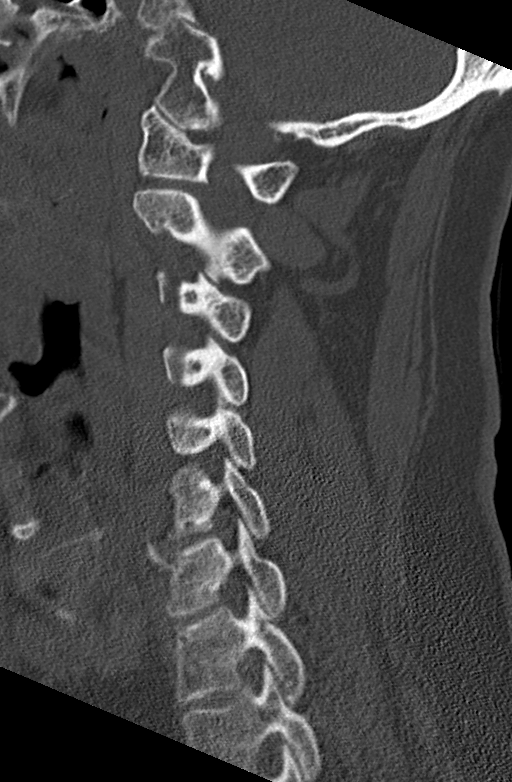
[im 35/70  soft-tissue]
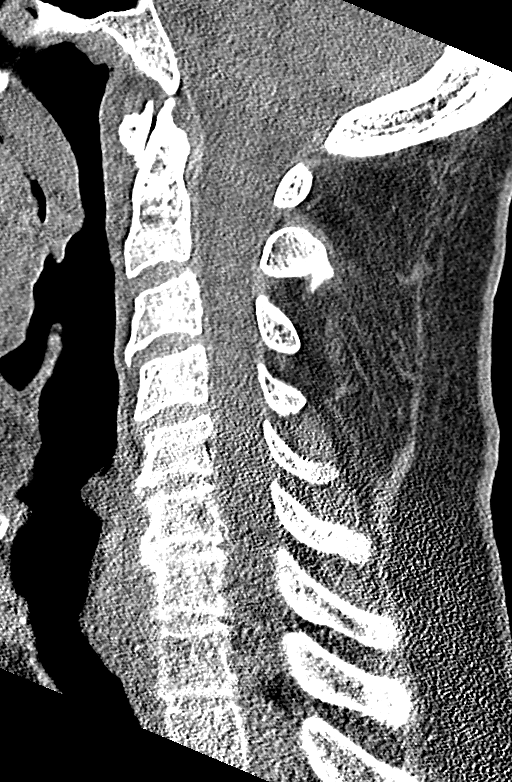
[im 35/70  bone]
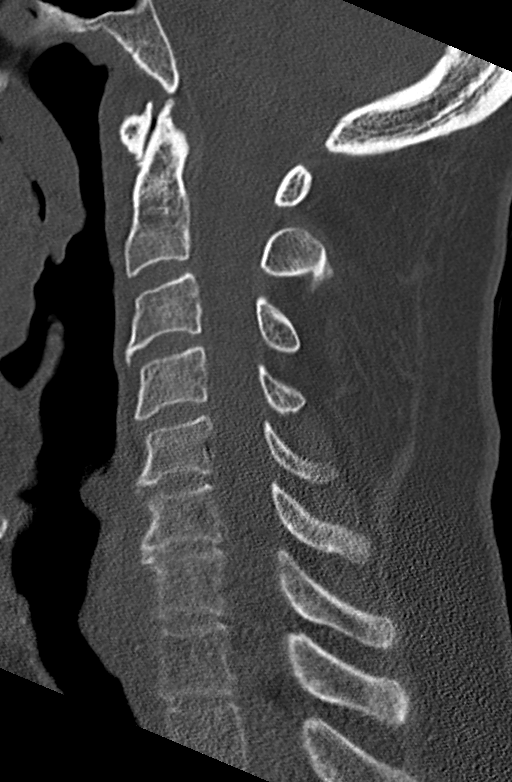
[im 41/70  bone]
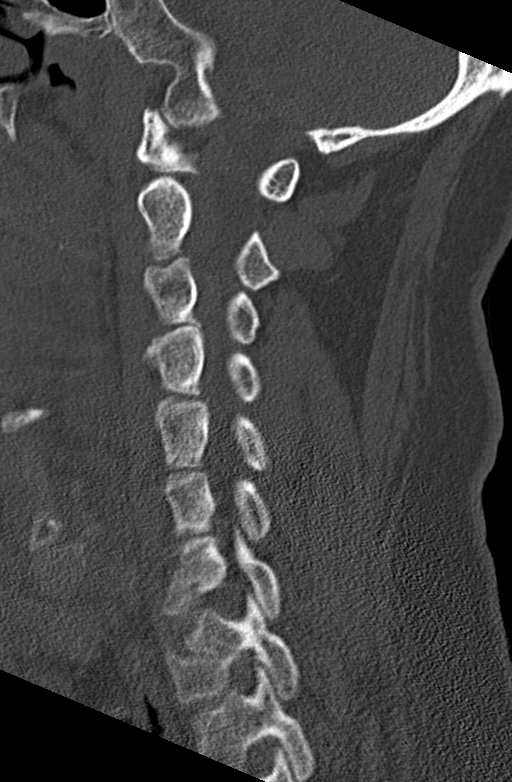
[im 47/70  bone]
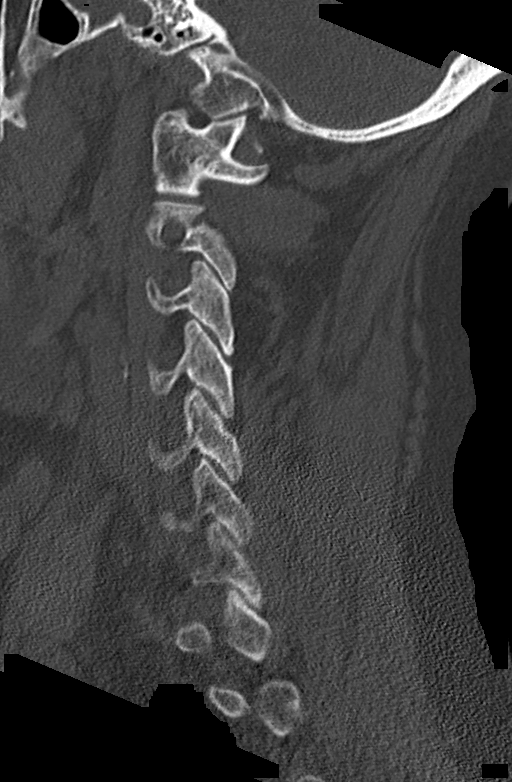

[Series 5: coronal bone · coronal · 0.28mm/px · 3 of 59 slices shown]
[im 13/59  bone]
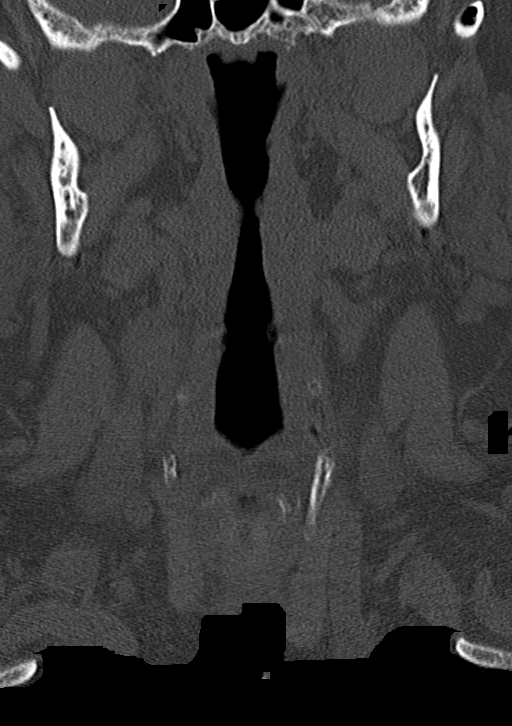
[im 24/59  bone]
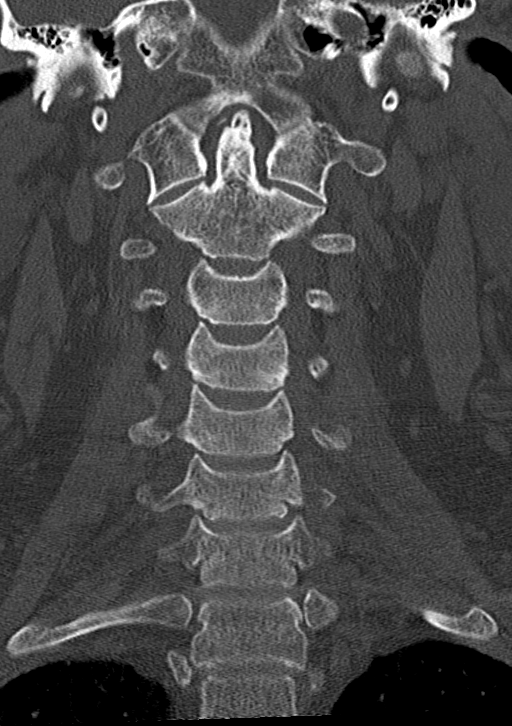
[im 35/59  bone]
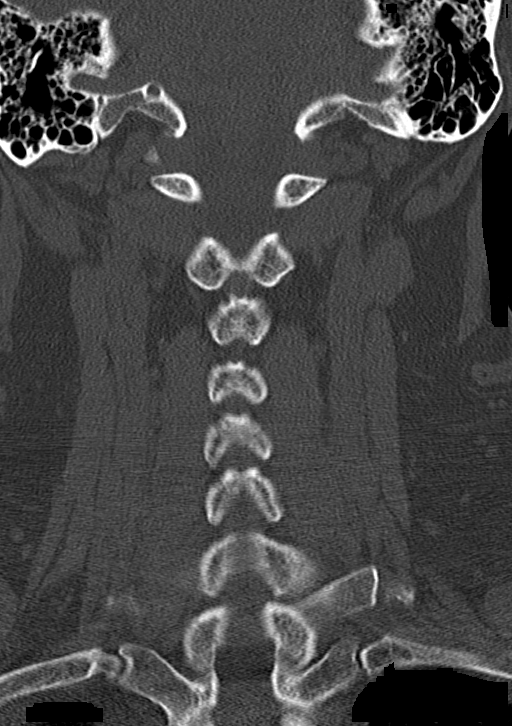

[Series 6: orthogonal bone · axial · 0.22mm/px · z∈[-21,+91]mm · 4 of 95 slices shown, 5 images]
[im 16/95  soft-tissue]
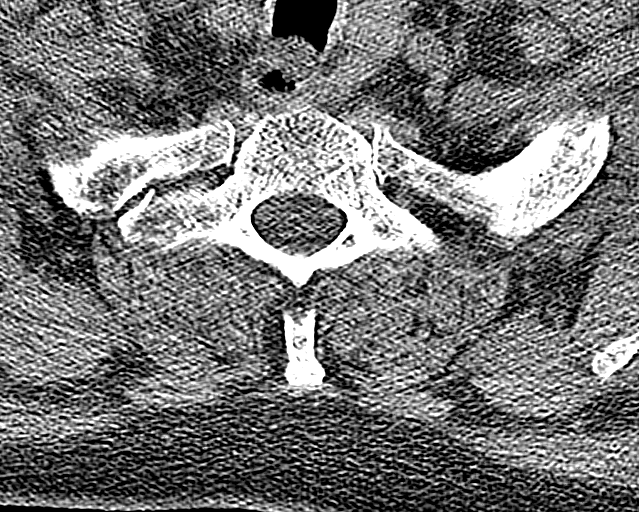
[im 16/95  bone]
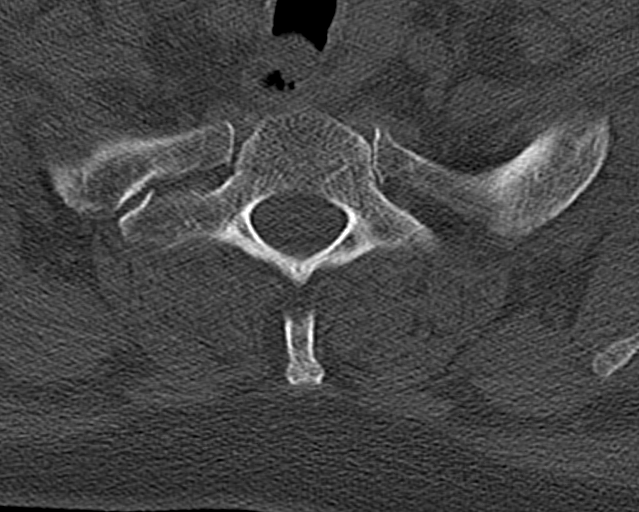
[im 32/95  bone]
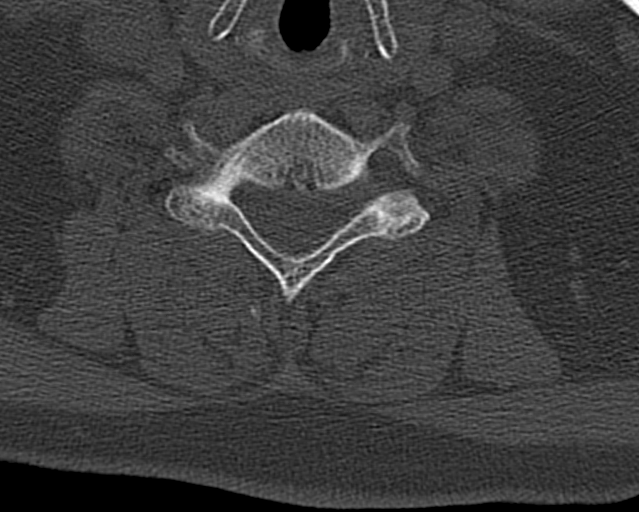
[im 63/95  bone]
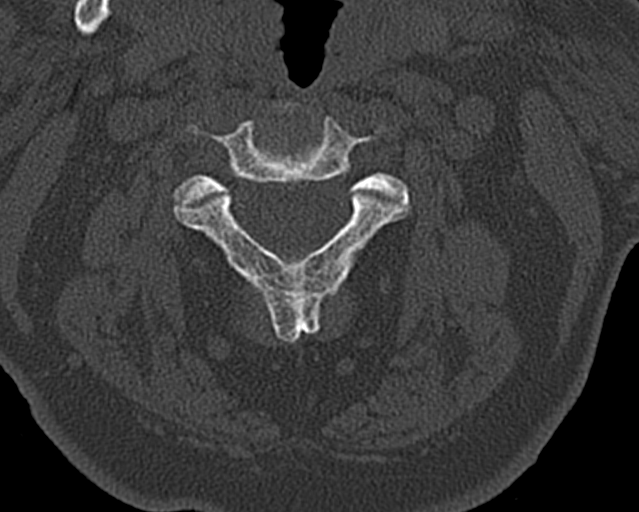
[im 79/95  bone]
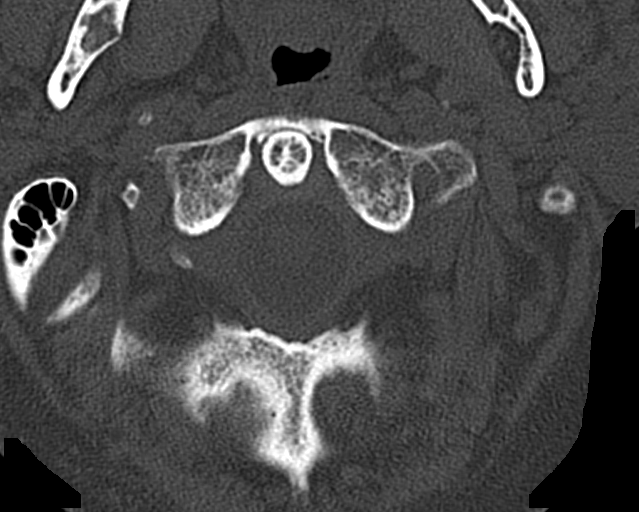

[12 of 33 positions shown; findings below may reference images not displayed]

FINDINGS: CT HEAD FINDINGS

Brain: No evidence of acute infarction, hemorrhage, hydrocephalus,
extra-axial collection or mass lesion/mass effect. Atrophy and
chronic microvascular ischemic changes are again noted.

Vascular: No hyperdense vessel or unexpected calcification.

Skull: Normal. Negative for fracture or focal lesion.

Sinuses/Orbits: No acute finding.

Other: None.

CT CERVICAL SPINE FINDINGS

Alignment: There is straightening of the cervical spine.

Skull base and vertebrae: No acute fracture. No primary bone lesion
or focal pathologic process.

Soft tissues and spinal canal: No prevertebral fluid or swelling. No
visible canal hematoma.

Disc levels: There is minimal disc height loss in the lower cervical
segments.

Upper chest: Negative.

Other: None
IMPRESSION: 1. No acute intracranial abnormality.
2. Atrophy and chronic microvascular ischemic changes of the brain.
3. No evidence of acute traumatic injury to the cervical spine.

## 2020-12-21 IMAGING — CT CT HEAD W/O CM
4 series · 16 of 47 positions shown, 18 images · non-contrast
Comparison: None.

CLINICAL DATA: Acute pain due to trauma.

EXAM:
CT HEAD WITHOUT CONTRAST
CT CERVICAL SPINE WITHOUT CONTRAST
TECHNIQUE: Multidetector CT imaging of the head and cervical spine was
performed following the standard protocol without intravenous
contrast. Multiplanar CT image reconstructions of the cervical spine
were also generated.

[Series 2: head wo · axial · 0.46mm/px · z∈[+160,+265]mm · 6 of 31 slices shown, 8 images]
[im 5/31  brain]
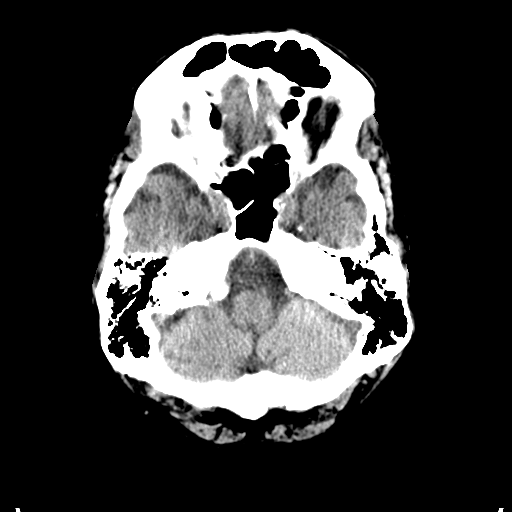
[im 5/31  bone]
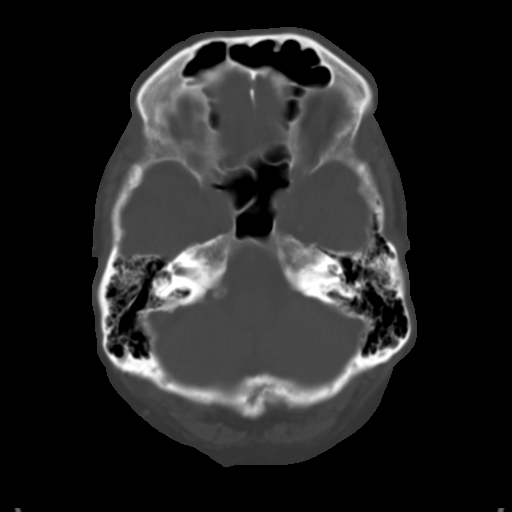
[im 9/31  brain]
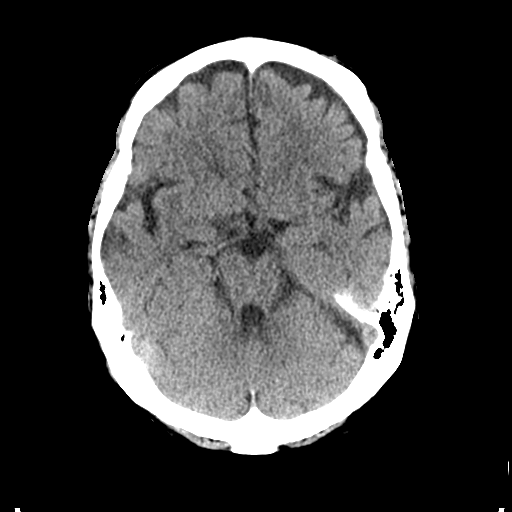
[im 13/31  brain]
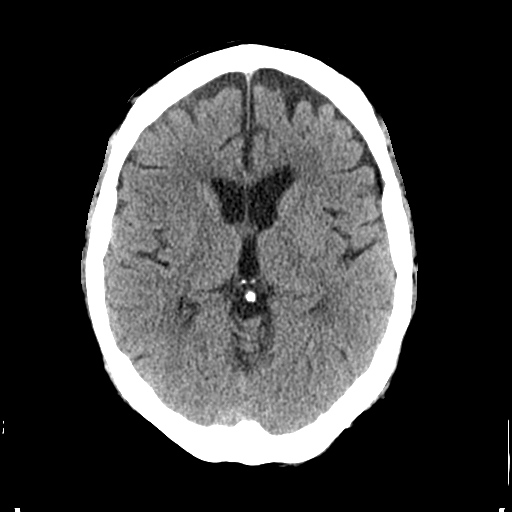
[im 18/31  brain]
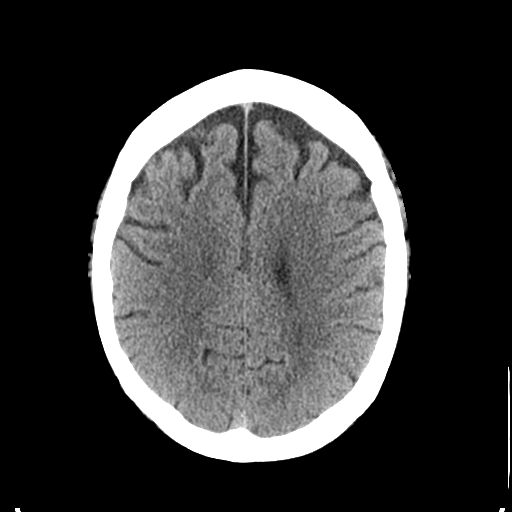
[im 22/31  brain]
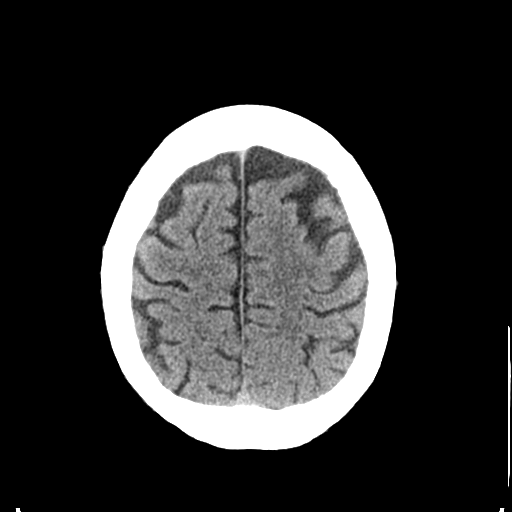
[im 22/31  bone]
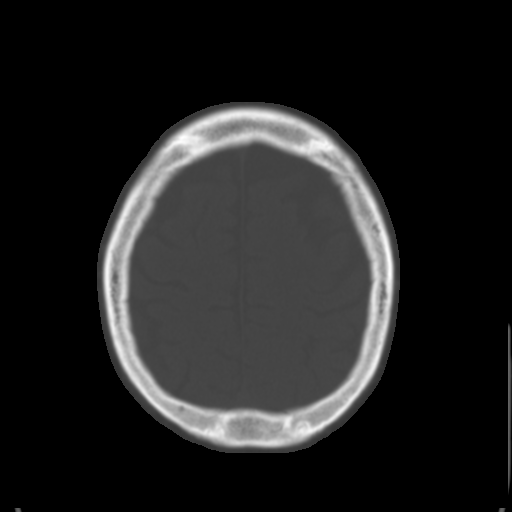
[im 26/31  brain]
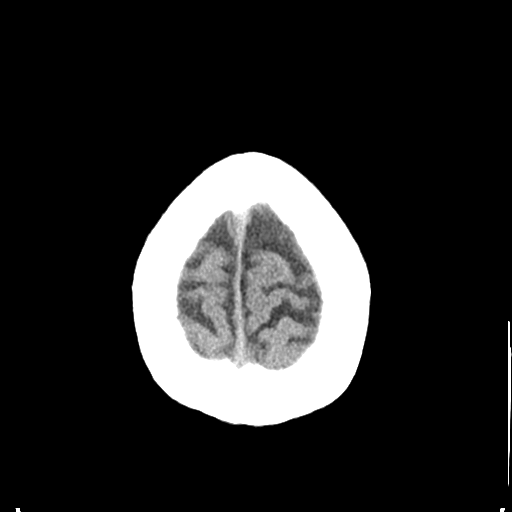

[Series 3: head bone · axial · 0.46mm/px · z∈[+154,+206]mm · 4 of 80 slices shown]
[im 8/80  bone]
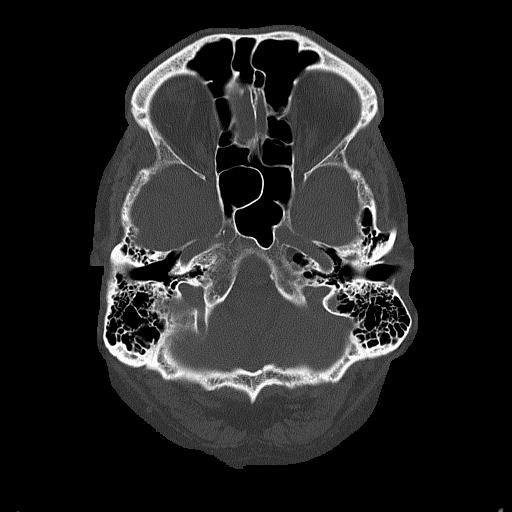
[im 16/80  bone]
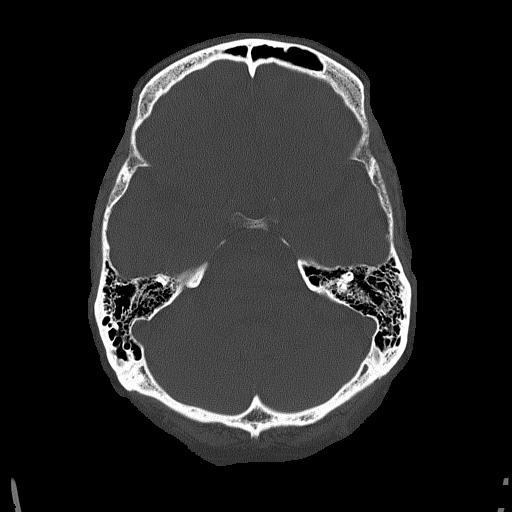
[im 27/80  bone]
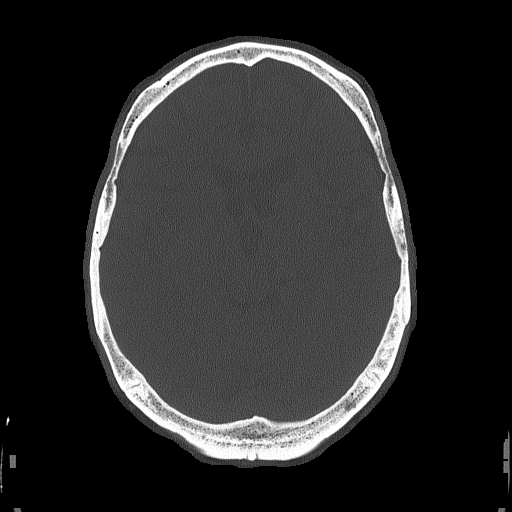
[im 34/80  bone]
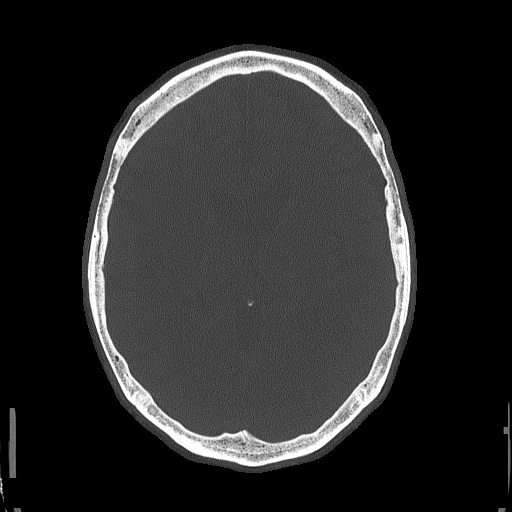

[Series 4: coronal soft tissue · coronal · 0.31mm/px · 3 of 68 slices shown]
[im 23/68  brain]
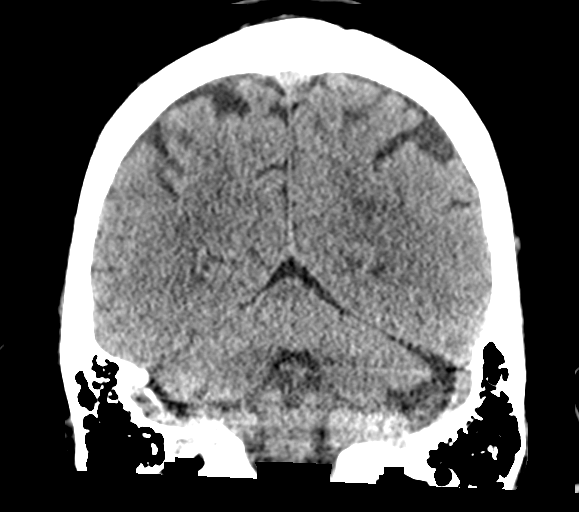
[im 30/68  brain]
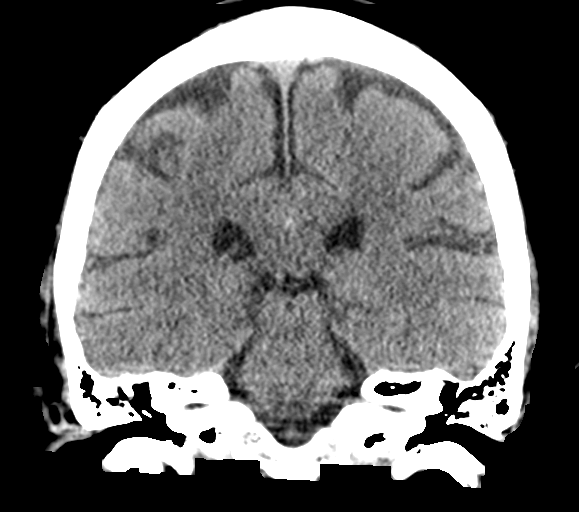
[im 38/68  brain]
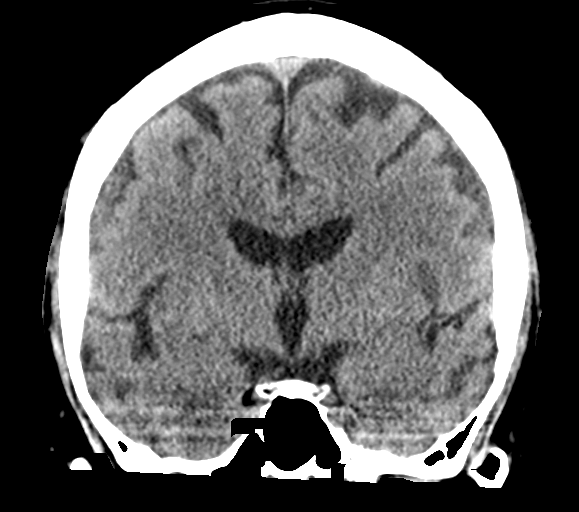

[Series 5: sagittal soft tissue · sagittal · 0.33mm/px · 3 of 55 slices shown]
[im 19/55  brain]
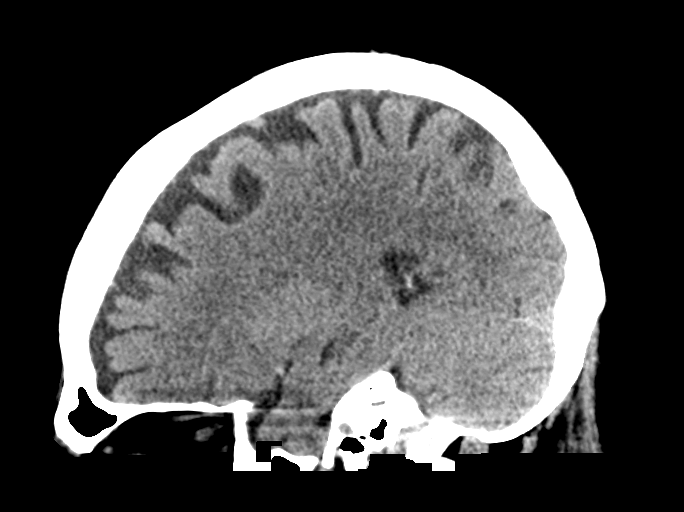
[im 28/55  brain]
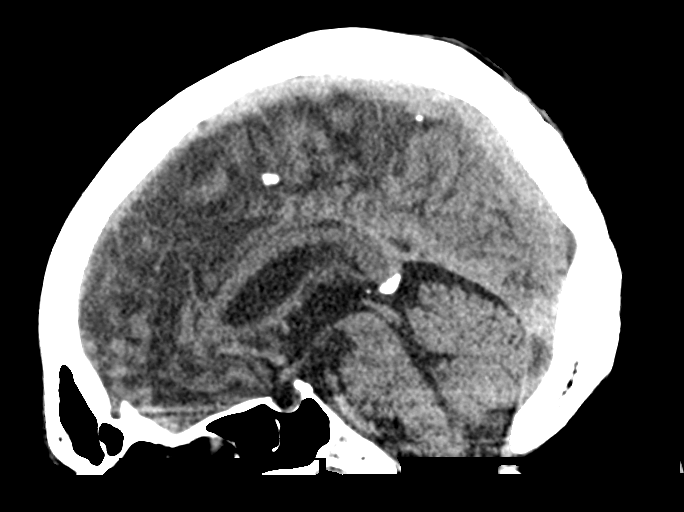
[im 37/55  brain]
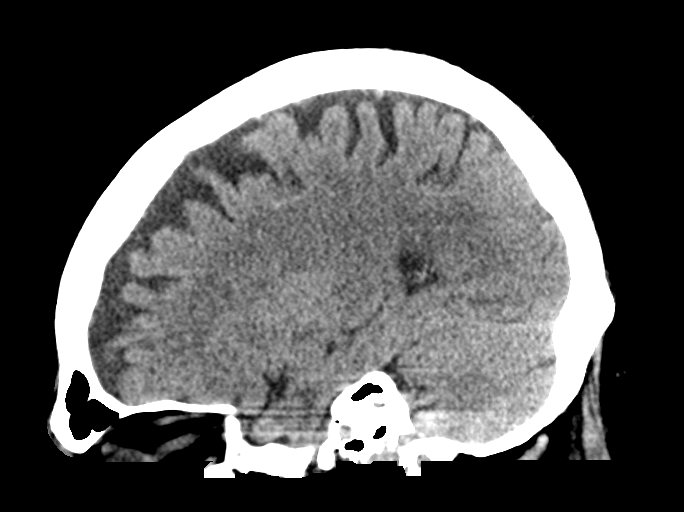

[16 of 47 positions shown; findings below may reference images not displayed]

FINDINGS: CT HEAD FINDINGS

Brain: No evidence of acute infarction, hemorrhage, hydrocephalus,
extra-axial collection or mass lesion/mass effect. Atrophy and
chronic microvascular ischemic changes are again noted.

Vascular: No hyperdense vessel or unexpected calcification.

Skull: Normal. Negative for fracture or focal lesion.

Sinuses/Orbits: No acute finding.

Other: None.

CT CERVICAL SPINE FINDINGS

Alignment: There is straightening of the cervical spine.

Skull base and vertebrae: No acute fracture. No primary bone lesion
or focal pathologic process.

Soft tissues and spinal canal: No prevertebral fluid or swelling. No
visible canal hematoma.

Disc levels: There is minimal disc height loss in the lower cervical
segments.

Upper chest: Negative.

Other: None
IMPRESSION: 1. No acute intracranial abnormality.
2. Atrophy and chronic microvascular ischemic changes of the brain.
3. No evidence of acute traumatic injury to the cervical spine.

## 2020-12-23 ENCOUNTER — Emergency Department
Admission: EM | Admit: 2020-12-23 | Discharge: 2020-12-24 | Disposition: A | Discharge status: Home / Self Care | Payer: Self-pay | Attending: Emergency Medicine | Admitting: Emergency Medicine

## 2020-12-23 ENCOUNTER — Other Ambulatory Visit: Payer: Self-pay

## 2020-12-23 DIAGNOSIS — F10129 Alcohol abuse with intoxication, unspecified: Secondary | ICD-10-CM | POA: Insufficient documentation

## 2020-12-23 DIAGNOSIS — S61512A Laceration without foreign body of left wrist, initial encounter: Secondary | ICD-10-CM | POA: Insufficient documentation

## 2020-12-23 DIAGNOSIS — Z7952 Long term (current) use of systemic steroids: Secondary | ICD-10-CM | POA: Insufficient documentation

## 2020-12-23 DIAGNOSIS — Y908 Blood alcohol level of 240 mg/100 ml or more: Secondary | ICD-10-CM | POA: Insufficient documentation

## 2020-12-23 DIAGNOSIS — Y92000 Kitchen of unspecified non-institutional (private) residence as  the place of occurrence of the external cause: Secondary | ICD-10-CM | POA: Insufficient documentation

## 2020-12-23 DIAGNOSIS — I10 Essential (primary) hypertension: Secondary | ICD-10-CM | POA: Insufficient documentation

## 2020-12-23 DIAGNOSIS — Z79899 Other long term (current) drug therapy: Secondary | ICD-10-CM | POA: Insufficient documentation

## 2020-12-23 DIAGNOSIS — Z87891 Personal history of nicotine dependence: Secondary | ICD-10-CM | POA: Insufficient documentation

## 2020-12-23 DIAGNOSIS — J45909 Unspecified asthma, uncomplicated: Secondary | ICD-10-CM | POA: Insufficient documentation

## 2020-12-23 DIAGNOSIS — F10920 Alcohol use, unspecified with intoxication, uncomplicated: Secondary | ICD-10-CM

## 2020-12-23 DIAGNOSIS — X788XXA Intentional self-harm by other sharp object, initial encounter: Secondary | ICD-10-CM | POA: Insufficient documentation

## 2020-12-23 LAB — CBC
HCT: 35.6 % — ABNORMAL LOW (ref 39.0–52.0)
Hemoglobin: 11.3 g/dL — ABNORMAL LOW (ref 13.0–17.0)
MCH: 28 pg (ref 26.0–34.0)
MCHC: 31.7 g/dL (ref 30.0–36.0)
MCV: 88.3 fL (ref 80.0–100.0)
Platelets: 198 10*3/uL (ref 150–400)
RBC: 4.03 MIL/uL — ABNORMAL LOW (ref 4.22–5.81)
RDW: 18.3 % — ABNORMAL HIGH (ref 11.5–15.5)
WBC: 6.1 10*3/uL (ref 4.0–10.5)
nRBC: 0 % (ref 0.0–0.2)

## 2020-12-23 LAB — COMPREHENSIVE METABOLIC PANEL
ALT: 17 U/L (ref 0–44)
AST: 14 U/L — ABNORMAL LOW (ref 15–41)
Albumin: 3.9 g/dL (ref 3.5–5.0)
Alkaline Phosphatase: 58 U/L (ref 38–126)
Anion gap: 10 (ref 5–15)
BUN: 11 mg/dL (ref 8–23)
CO2: 23 mmol/L (ref 22–32)
Calcium: 9 mg/dL (ref 8.9–10.3)
Chloride: 109 mmol/L (ref 98–111)
Creatinine, Ser: 1.06 mg/dL (ref 0.61–1.24)
GFR, Estimated: >60 mL/min (ref >60)
Glucose, Bld: 112 mg/dL — ABNORMAL HIGH (ref 70–99)
Potassium: 3.5 mmol/L (ref 3.5–5.1)
Sodium: 142 mmol/L (ref 135–145)
Total Bilirubin: 0.8 mg/dL (ref 0.3–1.2)
Total Protein: 6.6 g/dL (ref 6.5–8.1)

## 2020-12-23 LAB — ETHANOL: Alcohol, Ethyl (B): 286 mg/dL — ABNORMAL HIGH (ref <10)

## 2020-12-23 MED ORDER — LORAZEPAM 2 MG/ML IJ SOLN: 0.0000 mg | Freq: Four times a day (QID) | INTRAMUSCULAR | Status: DC

## 2020-12-23 MED ORDER — THIAMINE HCL 100 MG/ML IJ SOLN: 100.0000 mg | Freq: Every day | INTRAMUSCULAR | Status: DC

## 2020-12-23 MED ORDER — THIAMINE HCL 100 MG PO TABS: 100.0000 mg | ORAL_TABLET | Freq: Every day | ORAL | Status: DC

## 2020-12-23 MED ORDER — LORAZEPAM 2 MG PO TABS: 0.0000 mg | ORAL_TABLET | Freq: Two times a day (BID) | ORAL | Status: DC

## 2020-12-23 MED ORDER — LORAZEPAM 2 MG/ML IJ SOLN: 0.0000 mg | Freq: Two times a day (BID) | INTRAMUSCULAR | Status: DC

## 2020-12-23 MED ORDER — LORAZEPAM 2 MG PO TABS: 0.0000 mg | ORAL_TABLET | Freq: Four times a day (QID) | ORAL | Status: DC

## 2020-12-23 NOTE — ED Notes (Signed)
Self inflicted laceration to left arm states is SI per EMS.

## 2020-12-23 NOTE — ED Triage Notes (Addendum)
Pt with self inflicted laceration to left arm, states "people Kyo't like me". Positive etoh today, hx of the same. As patient was rolled back to room he stated he had 800$ in wallet. Rebeca RN made aware and she will secure it per policy.
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
## 2020-12-23 NOTE — ED Provider Notes (Addendum)
[facility] Emergency Department Provider Note   ____________________________________________   Event Date/Time   First MD Initiated Contact with Patient 12/23/20 2017     (approximate)  I have reviewed the triage vital signs and the nursing notes.   HISTORY  Chief Complaint Suicide Attempt    HPI Miguel [name] is a 60 y.o. male with a past medical history of alcohol abuse, anxiety, hypertension, and totality who presents via law enforcement with a cut to the dorsum of his left wrist that patient states he did because he was "upset".  Per nursing staff he has changed his story multiple times and is also stated that he "cut himself by accident in the kitchen" as well as telling the law enforcement officers that this was a suicide attempt.  Patient currently denies any suicidal ideation, homicidal ideation, auditory/visual hallucinations to me at this time.  However, patient has expressed suicidality as well as homicidal ideation towards a neighbor to our nursing staff.         Past Medical History:  Diagnosis Date  . Alcohol abuse   . Anxiety   . Asthma   . Family history of breast cancer   . GERD (gastroesophageal reflux disease)   . Gout   . Hx of colonic polyps   . Hypertension   . Kidney stone   . 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 OCD (obsessive compulsive disorder)   . Renal colic     Patient Active Problem List   Diagnosis Date Noted  . Hypotension 11/28/2020  . Genetic testing 07/15/2020  . 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 Right ankle pain 06/26/2020  . Family history of breast cancer   . Hx of colonic polyps   . Hospital discharge follow-up 06/05/2020  . Kidney function test abnormal 06/05/2020  . Abdominal pain 04/12/2020  . Hypertensive urgency 04/12/2020  . Alcohol withdrawal ([place]) 03/04/2020  . Right-sided chest wall pain 02/28/2020  . Alcohol abuse with alcohol-induced mood disorder  ([place]) 12/02/2019  . Moderate episode of recurrent major depressive disorder ([place])   . Health care maintenance 10/26/2019  . Right knee pain 10/26/2019  . Generalized anxiety disorder 10/14/2019  . MDD (major depressive disorder), recurrent severe, without psychosis ([place]) 07/27/2019  . Social anxiety disorder 07/27/2019  . Left-sided chest wall pain 06/14/2019  . Alcoholic cirrhosis of liver without ascites ([place]) 06/14/2019  . Alcohol abuse 06/14/2019  . AKI (acute kidney injury) ([place]) 05/27/2019  . Alcohol withdrawal syndrome without complication ([place]) 69/50/7225  . Alcoholic intoxication without complication ([place])   . Sepsis ([place]) 03/08/2019  . Breast lump or mass 10/04/2018  . Sepsis secondary to UTI ([place]) 09/14/2018  . Asthma 07/07/2018  . GERD (gastroesophageal reflux disease) 07/07/2018  . OCD (obsessive compulsive disorder) 07/07/2018  . Self-inflicted laceration of left wrist ([place]) 03/10/2018  . Leg hematoma 12/25/2017  . Suicide and self-inflicted injury by cutting and piercing instrument ([place]) 03/10/2017  . Severe recurrent major 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 ([place]) 03/09/2017  . Substance induced mood disorder ([place]) 08/15/2016  . Involuntary commitment 08/15/2016  . Alcohol use disorder, severe, dependence ([place]) 02/05/2016  . Hypertension 12/05/2015  . Tachycardia 12/05/2015  . Gout 11/13/2015  . Chronic back pain 05/02/2015    Past Surgical History:  Procedure Laterality Date  . CHOLECYSTECTOMY  2012  . COLONOSCOPY    . COLONOSCOPY WITH PROPOFOL N/A 05/28/2020   Procedure: COLONOSCOPY WITH PROPOFOL;  Surgeon: [name], MD;  Location: [facility] ENDOSCOPY;  Service: Gastroenterology;  Laterality: N/A;  . EXTRACORPOREAL SHOCK WAVE LITHOTRIPSY Left 01/12/2019   Procedure: EXTRACORPOREAL SHOCK WAVE LITHOTRIPSY (ESWL);  Surgeon: Billey Co, MD;  Location: [facility] ORS;  Service: Urology;  Laterality: Left;  . UPPER GI ENDOSCOPY      Prior to Admission medications    Medication Sig Start Date End Date Taking? Authorizing Provider  allopurinol (ZYLOPRIM) 300 MG tablet TAKE ONE TABLET BY MOUTH EVERY DAY 09/18/20   Iloabachie, Chioma E, NP  ascorbic acid (VITAMIN C) 500 MG tablet Take by mouth. 08/01/19   [provider]  citalopram (CELEXA) 20 MG tablet Take 1 tablet (20 mg total) by mouth daily. 03/21/20 03/21/21  Clapacs, Madie Reno, MD  Fluticasone-Salmeterol (ADVAIR DISKUS) 100-50 MCG/DOSE AEPB Inhale 1 puff into the lungs 2 (two) times daily. 03/21/20   Clapacs, Madie Reno, MD  folic acid (FOLVITE) 1 MG tablet Take 1 tablet (1 mg total) by mouth daily. 07/16/20   Iloabachie, Chioma E, NP  lisinopril (ZESTRIL) 40 MG tablet TAKE ONE TABLET BY MOUTH EVERY DAY 09/18/20   Iloabachie, Chioma E, NP  metoprolol tartrate (LOPRESSOR) 25 MG tablet Take 1 tablet (25 mg total) by mouth daily. 11/30/20 12/30/20  Wyvonnia Dusky, MD  pantoprazole (PROTONIX) 40 MG tablet Take 1 tablet (40 mg total) by mouth daily. 10/15/20   Iloabachie, Chioma E, NP  thiamine 100 MG tablet Take 1 tablet (100 mg total) by mouth daily. 10/22/20   Iloabachie, Chioma E, NP  VENTOLIN HFA 108 (90 Base) MCG/ACT inhaler INHALE 2 PUFFS EVERY 4 HOURS AS NEEDED 10/22/20   Iloabachie, Chioma E, NP    Allergies Percocet [oxycodone-acetaminophen]  Family History  Problem Relation Age of Onset  . Alcohol abuse Father   . Breast cancer Mother 68    Social History Social History   Tobacco Use  . Smoking status: Former Smoker    Types: Cigarettes  . Smokeless tobacco: Never Used  . Tobacco comment: quit 30 years ago  Vaping Use  . Vaping Use: Never used  Substance Use Topics  . Alcohol use: Yes    Alcohol/week: 12.0 standard drinks    Types: 12 Shots of liquor per week  . Drug use: No    Review of Systems Constitutional: No fever/chills Eyes: No visual changes. ENT: No sore throat. Cardiovascular: Denies chest pain. Respiratory: Denies shortness of breath. Gastrointestinal: No abdominal  pain.  No nausea, no vomiting.  No diarrhea. Genitourinary: Negative for dysuria. Musculoskeletal: Negative for acute arthralgias Skin: Negative for rash. Neurological: Negative for headaches, weakness/numbness/paresthesias in any extremity Psychiatric: Negative for suicidal ideation/homicidal ideation   ____________________________________________   PHYSICAL EXAM:  VITAL SIGNS: ED Triage Vitals [12/23/20 2000]  Enc Vitals Group     BP (!) 121/93     Pulse Rate 87     Resp 20     Temp 98.8 F (37.1 C)     Temp Source Oral     SpO2 99 %     Weight 187 lb (84.8 kg)     Height 6' (1.829 m)     Head Circumference      Peak Flow      Pain Score 8     Pain Loc      Pain Edu?      Excl. in [place]?    Constitutional: Alert and oriented. Well appearing and in no acute distress. Eyes: Conjunctivae are normal. PERRL. Head: Atraumatic. Nose: No  congestion/rhinnorhea. Mouth/Throat: Mucous membranes are moist. Neck: No stridor Cardiovascular: Grossly normal heart sounds.  Good peripheral circulation. Respiratory: Normal respiratory effort.  No retractions. Gastrointestinal: Soft and nontender. No distention. Musculoskeletal: No obvious deformities Neurologic:  Normal speech and language. No gross focal neurologic deficits are appreciated. Skin:  Skin is warm and dry. No rash noted.  3 cm laceration to the distal dorsum of the left wrist that is hemostatic Psychiatric: Mood and affect are normal. Speech and behavior are normal.  ____________________________________________   LABS (all labs ordered are listed, but only abnormal results are displayed)  Labs Reviewed  CBC - Abnormal; Notable for the following components:      Result Value   RBC 4.03 (*)    Hemoglobin 11.3 (*)    HCT 35.6 (*)    RDW 18.3 (*)    All other components within normal limits  COMPREHENSIVE METABOLIC PANEL - Abnormal; Notable for the following components:   Glucose, Bld 112 (*)    AST 14 (*)    All  other components within normal limits  ETHANOL - Abnormal; Notable for the following components:   Alcohol, Ethyl (B) 286 (*)    All other components within normal limits  URINALYSIS, COMPLETE (UACMP) WITH MICROSCOPIC  URINE DRUG SCREEN, QUALITATIVE ([facility] ONLY)     PROCEDURES  Procedure(s) performed (including Critical Care):  Marland KitchenMarland KitchenLaceration Repair  Date/Time: 12/23/2020 9:59 PM Performed by: Naaman Plummer, MD Authorized by: Naaman Plummer, MD   Consent:    Consent obtained:  Verbal   Consent given by:  Patient   Risks, benefits, and alternatives were discussed: yes     Risks discussed:  Infection, need for additional repair, poor cosmetic result, pain and retained foreign body   Alternatives discussed:  No treatment and delayed treatment Universal protocol:    Patient identity confirmed:  Verbally with patient and arm band Anesthesia:    Anesthesia method:  None Laceration details:    Location:  Shoulder/arm   Shoulder/arm location:  L lower arm   Length (cm):  3   Depth (mm):  5 Pre-procedure details:    Preparation:  Patient was prepped and draped in usual sterile fashion Exploration:    Imaging obtained: bedside ultrasound     Imaging outcome: foreign body not noted     Wound exploration: wound explored through full range of motion and entire depth of wound visualized     Contaminated: no   Treatment:    Area cleansed with:  Saline   Amount of cleaning:  Standard   Irrigation solution:  Sterile saline   Irrigation volume:  200   Irrigation method:  Pressure wash   Visualized foreign bodies/material removed: no     Debridement:  None   Undermining:  None   Scar revision: no   Skin repair:    Repair method:  Steri-Strips   Number of Steri-Strips:  2 Approximation:    Approximation:  Close Repair type:    Repair type:  Simple Post-procedure details:    Dressing:  Antibiotic ointment and sterile dressing   Procedure completion:  Tolerated well, no immediate  complications     ____________________________________________   INITIAL IMPRESSION / ASSESSMENT AND PLAN / ED COURSE  As part of my medical decision making, I reviewed the following data within the [place] notes reviewed and incorporated, Labs reviewed, Old chart reviewed,  and Notes from prior ED visits reviewed and incorporated  Thoughts are linear and organized, and patient has no SI, AH, VH, or HI. Prior suicide attempt by cutting and overdose Prior Psychiatric Hospitalizations: Multiple  Clinically patient displays no overt toxidrome; they are well appearing, with low suspicion for toxic ingestion given history and exam. Thoughts unlikely 2/2 anemia, hypothyroidism, infection, or ICH.  Patient currently expresses no suicidal ideation, homicidal ideation, auditory/visual hallucinations. Laceration repaired at bedside.  The patient has been reexamined and is ready to be discharged.  All diagnostic results have been reviewed and discussed with the patient/family.  Care plan has been outlined and the patient/family understands all current diagnoses, results, and treatment plans.  There are no new complaints, changes, or physical findings at this time.  All questions have been addressed and answered.  Laceration care instructions provided.  Patient was instructed to, and agrees to follow-up with their primary care physician as well as return to the emergency department if any new or worsening symptoms develop.       ____________________________________________   FINAL CLINICAL IMPRESSION(S) / ED DIAGNOSES  Final diagnoses:  Laceration of left wrist, initial encounter  Alcoholic intoxication without complication [facility])     ED Discharge Orders    None       Note:  This document was prepared using Dragon voice recognition software and may include unintentional dictation errors.   Naaman Plummer, MD 12/23/20 2202    Naaman Plummer,  MD 12/23/20 [phone]

## 2020-12-23 NOTE — ED Notes (Addendum)
Pt given back clothing and pt verbally verified with this RN that all clothes and money were accounted for. Pt originally stated to this RN that he had 800$ but pt had 822$ on him at this time and counted again with this RN with pt verbally stating "yes, this is it". Pt then separated out a 20$ bill and tried to give it to this RN. This RN responded by saying that hospital staff is unable to accept money from patients. Pt requested a cab that he would pay for in cash and cab company called and on way. This RN then closed the door to allow pt to change back into clothes from hospital scrubs. Sergeant Giroux was present and watched pt's door the entire time pt was dressing. No one else entered the room. When this RN went back into pt's room to DC pt, pt reported that he could not find his money. This RN and [employer] both observed pt dressed in room before this RN entered. This RN, [employer], and EDT Ariel searched through all bedding, pt's pockets, shoes, socks, shirt, all hospital provided attire and all areas in room including on top and around TV, the door was lifted to check near equipment and bed mattress unzipped and bed checked entirely. Pt asked by this RN where did you put the money, pt then stated "nowhere, it must have disappeared". Pt taken to lobby for taxi at this time.

## 2020-12-23 NOTE — ED Notes (Signed)
Pearline Cables sneakers Socks Jeans  White tshirt Wallet  cellphone All locked in [employer]

## 2020-12-24 NOTE — ED Notes (Signed)
[employer] Press photographer to inform that pt wanted to grab his coat but they did not have time. Per EMS, first nurse that pt did not have jacket on when he arrived to ED.

## 2020-12-24 NOTE — ED Notes (Signed)
Pt refusing SOC at this time. Pt stating that he just wants to go home and is not suicidal. MD Leory Plowman aware and pt's placed up for DC.

## 2020-12-24 NOTE — ED Notes (Addendum)
Nursing supervisor in triage room with Probation officer and security. Writer counted in front all x3 and patient provided verbal agreeance that all money was accounted for. All witnesses and patient signed patient valuables envelope.   Pt placed shoes back on and escorted out to lobby for cab pick up.
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
## 2020-12-31 ENCOUNTER — Emergency Department: Payer: Medicaid Other

## 2020-12-31 ENCOUNTER — Encounter: Payer: Self-pay | Admitting: Emergency Medicine

## 2020-12-31 ENCOUNTER — Emergency Department
Admission: EM | Admit: 2020-12-31 | Discharge: 2020-12-31 | Disposition: A | Discharge status: Home / Self Care | Payer: Medicaid Other | Attending: Emergency Medicine | Admitting: Emergency Medicine

## 2020-12-31 ENCOUNTER — Other Ambulatory Visit: Payer: Self-pay | Admitting: Physician Assistant

## 2020-12-31 ENCOUNTER — Other Ambulatory Visit: Payer: Self-pay

## 2020-12-31 DIAGNOSIS — Y999 Unspecified external cause status: Secondary | ICD-10-CM | POA: Insufficient documentation

## 2020-12-31 DIAGNOSIS — L089 Local infection of the skin and subcutaneous tissue, unspecified: Secondary | ICD-10-CM

## 2020-12-31 DIAGNOSIS — L03114 Cellulitis of left upper limb: Secondary | ICD-10-CM | POA: Insufficient documentation

## 2020-12-31 DIAGNOSIS — Z87891 Personal history of nicotine dependence: Secondary | ICD-10-CM | POA: Insufficient documentation

## 2020-12-31 DIAGNOSIS — I10 Essential (primary) hypertension: Secondary | ICD-10-CM | POA: Insufficient documentation

## 2020-12-31 DIAGNOSIS — Y9389 Activity, other specified: Secondary | ICD-10-CM | POA: Insufficient documentation

## 2020-12-31 DIAGNOSIS — Y9289 Other specified places as the place of occurrence of the external cause: Secondary | ICD-10-CM | POA: Insufficient documentation

## 2020-12-31 DIAGNOSIS — T148XXA Other injury of unspecified body region, initial encounter: Secondary | ICD-10-CM

## 2020-12-31 DIAGNOSIS — X58XXXA Exposure to other specified factors, initial encounter: Secondary | ICD-10-CM | POA: Insufficient documentation

## 2020-12-31 DIAGNOSIS — S51812A Laceration without foreign body of left forearm, initial encounter: Secondary | ICD-10-CM | POA: Insufficient documentation

## 2020-12-31 DIAGNOSIS — R0981 Nasal congestion: Secondary | ICD-10-CM | POA: Insufficient documentation

## 2020-12-31 DIAGNOSIS — J45909 Unspecified asthma, uncomplicated: Secondary | ICD-10-CM | POA: Insufficient documentation

## 2020-12-31 DIAGNOSIS — Z79899 Other long term (current) drug therapy: Secondary | ICD-10-CM | POA: Insufficient documentation

## 2020-12-31 MED ORDER — SULFAMETHOXAZOLE-TRIMETHOPRIM 800-160 MG PO TABS: 1.0000 | ORAL_TABLET | Freq: Two times a day (BID) | ORAL | 0 refills | Status: DC

## 2020-12-31 MED ORDER — IPRATROPIUM-ALBUTEROL 0.5-2.5 (3) MG/3ML IN SOLN
3.0000 mL | Freq: Once | RESPIRATORY_TRACT | Status: AC
  Administered 2020-12-31: 3 mL via RESPIRATORY_TRACT
  Filled 2020-12-31: qty 3

## 2020-12-31 MED ORDER — FEXOFENADINE-PSEUDOEPHED ER 60-120 MG PO TB12: 1.0000 | ORAL_TABLET | Freq: Two times a day (BID) | ORAL | 0 refills | Status: DC

## 2020-12-31 NOTE — ED Notes (Signed)
See triage note  Presents with nasal congestion  No fever

## 2020-12-31 NOTE — Discharge Instructions (Signed)
Follow discharge care instruction take medication as directed.  Change dressing on left forearm daily.

## 2020-12-31 NOTE — ED Triage Notes (Signed)
First nurse note: Patient to ER from home via [facility] for c/o "stuffy nose". Patient reports taking nasal spray without relief.

## 2020-12-31 NOTE — ED Provider Notes (Signed)
[facility] Emergency Department Provider Note   ____________________________________________   Event Date/Time   First MD Initiated Contact with Patient 12/31/20 1434     (approximate)  I have reviewed the triage vital signs and the nursing notes.   HISTORY  Chief Complaint Nasal Congestion    HPI Miguel [name] is a 60 y.o. male patient presents with nasal congestion.  Patient state he is using a nasal spray which is no longer working.  Patient is using a nasal spray every day against advice of prescribing doctor.  Patient also complain of left wrist pain secondary to laceration 1 week ago.  Wound was too old to be close on initial presentation and Steri-Strips were applied.  Patient said he has applied sterile strips at home and continue to apply dressings.  Patient is a reliable historian.  He has a history of alcohol and substance abuse disorder.  History of suicidal ideations and gestures.  Past Medical History:  Diagnosis Date  . Alcohol abuse   . Anxiety   . Asthma   . Family history of breast cancer   . GERD (gastroesophageal reflux disease)   . Gout   . Hx of colonic polyps   . Hypertension   . Kidney stone   . 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 OCD (obsessive compulsive disorder)   . Renal colic     Patient Active Problem List   Diagnosis Date Noted  . Hypotension 11/28/2020  . Genetic testing 07/15/2020  . 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 Right ankle pain 06/26/2020  . Family history of breast cancer   . Hx of colonic polyps   . Hospital discharge follow-up 06/05/2020  . Kidney function test abnormal 06/05/2020  . Abdominal pain 04/12/2020  . Hypertensive urgency 04/12/2020  . Alcohol withdrawal ([place]) 03/04/2020  . Right-sided chest wall pain 02/28/2020  . Alcohol abuse with alcohol-induced mood disorder ([place]) 12/02/2019  . Moderate episode of recurrent major depressive disorder ([place])    . Health care maintenance 10/26/2019  . Right knee pain 10/26/2019  . Generalized anxiety disorder 10/14/2019  . MDD (major depressive disorder), recurrent severe, without psychosis ([place]) 07/27/2019  . Social anxiety disorder 07/27/2019  . Left-sided chest wall pain 06/14/2019  . Alcoholic cirrhosis of liver without ascites ([place]) 06/14/2019  . Alcohol abuse 06/14/2019  . AKI (acute kidney injury) ([place]) 05/27/2019  . Alcohol withdrawal syndrome without complication ([place]) 16/08/9603  . Alcoholic intoxication without complication ([place])   . Sepsis ([place]) 03/08/2019  . Breast lump or mass 10/04/2018  . Sepsis secondary to UTI ([place]) 09/14/2018  . Asthma 07/07/2018  . GERD (gastroesophageal reflux disease) 07/07/2018  . OCD (obsessive compulsive disorder) 07/07/2018  . Self-inflicted laceration of left wrist ([place]) 03/10/2018  . Leg hematoma 12/25/2017  . Suicide and self-inflicted injury by cutting and piercing instrument ([place]) 03/10/2017  . Severe recurrent major 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 ([place]) 03/09/2017  . Substance induced mood disorder ([place]) 08/15/2016  . Involuntary commitment 08/15/2016  . Alcohol use disorder, severe, dependence ([place]) 02/05/2016  . Hypertension 12/05/2015  . Tachycardia 12/05/2015  . Gout 11/13/2015  . Chronic back pain 05/02/2015    Past Surgical History:  Procedure Laterality Date  . CHOLECYSTECTOMY  2012  . COLONOSCOPY    . COLONOSCOPY WITH PROPOFOL N/A 05/28/2020   Procedure: COLONOSCOPY WITH PROPOFOL;  Surgeon: [name], MD;  Location[facility] ENDOSCOPY;  Service: Gastroenterology;  Laterality: N/A;  .  EXTRACORPOREAL SHOCK WAVE LITHOTRIPSY Left 01/12/2019   Procedure: EXTRACORPOREAL SHOCK WAVE LITHOTRIPSY (ESWL);  Surgeon: Billey Co, MD;  Location: [facility] ORS;  Service: Urology;  Laterality: Left;  . UPPER GI ENDOSCOPY      Prior to Admission medications   Medication Sig Start Date End Date Taking? Authorizing Provider   fexofenadine-pseudoephedrine (ALLEGRA-D) 60-120 MG 12 hr tablet Take 1 tablet by mouth 2 (two) times daily. 12/31/20  Yes Sable Feil, PA-C  sulfamethoxazole-trimethoprim (BACTRIM DS) 800-160 MG tablet Take 1 tablet by mouth 2 (two) times daily. 12/31/20  Yes Sable Feil, PA-C  allopurinol (ZYLOPRIM) 300 MG tablet TAKE ONE TABLET BY MOUTH EVERY DAY 09/18/20   Iloabachie, Chioma E, NP  ascorbic acid (VITAMIN C) 500 MG tablet Take by mouth. 08/01/19   [provider]  citalopram (CELEXA) 20 MG tablet Take 1 tablet (20 mg total) by mouth daily. 03/21/20 03/21/21  Clapacs, Madie Reno, MD  Fluticasone-Salmeterol (ADVAIR DISKUS) 100-50 MCG/DOSE AEPB Inhale 1 puff into the lungs 2 (two) times daily. 03/21/20   Clapacs, Madie Reno, MD  folic acid (FOLVITE) 1 MG tablet Take 1 tablet (1 mg total) by mouth daily. 07/16/20   Iloabachie, Chioma E, NP  lisinopril (ZESTRIL) 40 MG tablet TAKE ONE TABLET BY MOUTH EVERY DAY 09/18/20   Iloabachie, Chioma E, NP  metoprolol tartrate (LOPRESSOR) 25 MG tablet Take 1 tablet (25 mg total) by mouth daily. 11/30/20 12/30/20  Wyvonnia Dusky, MD  pantoprazole (PROTONIX) 40 MG tablet Take 1 tablet (40 mg total) by mouth daily. 10/15/20   Iloabachie, Chioma E, NP  thiamine 100 MG tablet Take 1 tablet (100 mg total) by mouth daily. 10/22/20   Iloabachie, Chioma E, NP  VENTOLIN HFA 108 (90 Base) MCG/ACT inhaler INHALE 2 PUFFS EVERY 4 HOURS AS NEEDED 10/22/20   Iloabachie, Chioma E, NP    Allergies Percocet [oxycodone-acetaminophen]  Family History  Problem Relation Age of Onset  . Alcohol abuse Father   . Breast cancer Mother 51    Social History Social History   Tobacco Use  . Smoking status: Former Smoker    Types: Cigarettes  . Smokeless tobacco: Never Used  . Tobacco comment: quit 30 years ago  Vaping Use  . Vaping Use: Never used  Substance Use Topics  . Alcohol use: Yes    Alcohol/week: 12.0 standard drinks    Types: 12 Shots of liquor per week  . Drug  use: No    Review of Systems Constitutional: No fever/chills Eyes: No visual changes. ENT: No sore throat. Cardiovascular: Denies chest pain. Respiratory: Denies shortness of breath. Gastrointestinal: No abdominal pain.  No nausea, no vomiting.  No diarrhea.  No constipation. Genitourinary: Negative for dysuria. Musculoskeletal: Negative for back pain. Skin: Negative for rash. Neurological: Negative for headaches, focal weakness or numbness. Psychiatric:  Anxiety, depression, and OCD.  Endocrine:  Hypertension Allergic/Immunilogical: Percocets. ____________________________________________   PHYSICAL EXAM:  VITAL SIGNS: ED Triage Vitals  Enc Vitals Group     BP 12/31/20 1413 (!) 149/92     Pulse Rate 12/31/20 1413 79     Resp 12/31/20 1413 19     Temp 12/31/20 1413 98 F (36.7 C)     Temp Source 12/31/20 1413 Oral     SpO2 12/31/20 1413 98 %     Weight 12/31/20 1406 186 lb 15.2 oz (84.8 kg)     Height 12/31/20 1406 6' (1.829 m)     Head Circumference --  Peak Flow --      Pain Score 12/31/20 1405 0     Pain Loc --      Pain Edu? --      Excl. in [place]? --    Constitutional: Alert and oriented. Well appearing and in no acute distress. Eyes: Conjunctivae are normal. PERRL. EOMI. Head: Atraumatic. Nose: Edematous nasal turbinates. Mouth/Throat: Mucous membranes are moist.  Nose nasal drainage.  Oropharynx non-erythematous. Neck: No stridor. Hematological/Lymphatic/Immunilogical: No cervical lymphadenopathy. Cardiovascular: Normal rate, regular rhythm. Grossly normal heart sounds.  Good peripheral circulation. Respiratory: Normal respiratory effort.  No retractions. Lungs CTAB. Gastrointestinal: Soft and nontender. No distention. No abdominal bruits. No CVA tenderness. Musculoskeletal: No lower extremity tenderness nor edema.  No joint effusions. Neurologic:  Normal speech and language. No gross focal neurologic deficits are appreciated. No gait instability. Skin:  Skin  is warm, dry and intact. No rash noted. Psychiatric: Mood and affect are normal. Speech and behavior are normal.  ____________________________________________   LABS (all labs ordered are listed, but only abnormal results are displayed)  Labs Reviewed - No data to display ____________________________________________  EKG ____________________________________________  RADIOLOGY I, Sable Feil, personally viewed and evaluated these images (plain radiographs) as part of my medical decision making, as well as reviewing the written report by the radiologist.  ED MD interpretation: No acute findings x-ray of the sinuses and chest. Official radiology report(s): DG Sinuses Complete  Result Date: 12/31/2020 CLINICAL DATA:  Intermittent sinus congestion for 1 month, rhinorrhea EXAM: PARANASAL SINUSES - COMPLETE 3 + VIEW COMPARISON:  None. FINDINGS: Frontal, [place], submental vertex, and lateral views of the facial bones and paranasal sinuses are obtained. Paranasal sinuses are clear. No gas fluid levels. Nasal septum is midline. No acute bony abnormalities. IMPRESSION: 1. Unremarkable facial bones and paranasal sinuses. Electronically Signed   By: Randa Ngo M.D.   On: 12/31/2020 15:42   DG Chest 2 View  Result Date: 12/31/2020 CLINICAL DATA:  Wheezing and nasal congestion for 2 days. EXAM: CHEST - 2 VIEW COMPARISON:  PA and lateral chest 12/20/2020 and 05/10/2020. FINDINGS: The lungs are clear. Heart size is normal. No pneumothorax or pleural fluid. No acute or focal bony abnormality. IMPRESSION: No acute disease. Electronically Signed   By: Inge Rise M.D.   On: 12/31/2020 15:42    ____________________________________________   PROCEDURES  Procedure(s) performed (including Critical Care):  Procedures   ____________________________________________   INITIAL IMPRESSION / ASSESSMENT AND PLAN / ED COURSE  As part of my medical decision making, I reviewed the following data  within the [place]         Patient presents with intermittent sinus and chest congestion for over a month.  Patient also has a laceration to the left forearm greater than 10 days that is not healing.  Patient is a poor historian so we cannot really tell if it is following discharge care instruction.  Discussed no acute findings on sinus and chest x-rays.  Old dressing was removed from laceration of the left forearm.  Area was clean and new dressing applied.  Patient again given discharge care instruction for wound care.  Patient also given a prescription for Allegra-D and Bactrim DS.  Advised to follow-up with PCP.      ____________________________________________   FINAL CLINICAL IMPRESSION(S) / ED DIAGNOSES  Final diagnoses:  Nasal sinus congestion  Wound infection     ED Discharge Orders         Ordered  sulfamethoxazole-trimethoprim (BACTRIM DS) 800-160 MG tablet  2 times daily        12/31/20 1550    fexofenadine-pseudoephedrine (ALLEGRA-D) 60-120 MG 12 hr tablet  2 times daily        12/31/20 1550          *Please note:  Miguel [name] was evaluated in Emergency Department on 12/31/2020 for the symptoms described in the history of present illness. He was evaluated in the context of the global COVID-19 pandemic, which necessitated consideration that the patient might be at risk for infection with the SARS-CoV-2 virus that causes COVID-19. Institutional protocols and algorithms that pertain to the evaluation of patients at risk for COVID-19 are in a state of rapid change based on information released by regulatory bodies including the CDC and federal and state organizations. These policies and algorithms were followed during the patient's care in the ED.  Some ED evaluations and interventions may be delayed as a result of limited staffing during and the pandemic.*   Note:  This document was prepared using Dragon voice recognition software and may include  unintentional dictation errors.    Sable Feil, PA-C 12/31/20 1553    Nance Pear, MD 01/01/21 [phone]

## 2021-01-02 ENCOUNTER — Ambulatory Visit: Payer: Medicaid Other | Admitting: Gerontology

## 2021-01-06 ENCOUNTER — Other Ambulatory Visit: Payer: Self-pay | Admitting: Gerontology

## 2021-01-06 DIAGNOSIS — F1010 Alcohol abuse, uncomplicated: Secondary | ICD-10-CM

## 2021-01-07 ENCOUNTER — Other Ambulatory Visit: Payer: Self-pay | Admitting: Gerontology

## 2021-01-10 IMAGING — CR DG CHEST 2V
1 series · 2 of 2 positions shown · non-contrast
Comparison: Radiograph 03/03/2020, CT 03/08/2019

CLINICAL DATA: Right chest pain with deep breathing and movement

EXAM:
CHEST - 2 VIEW

[Series 1: dg chest 2 view · 0.14mm/px · 2 of 2 slices shown]
[im 1/2]
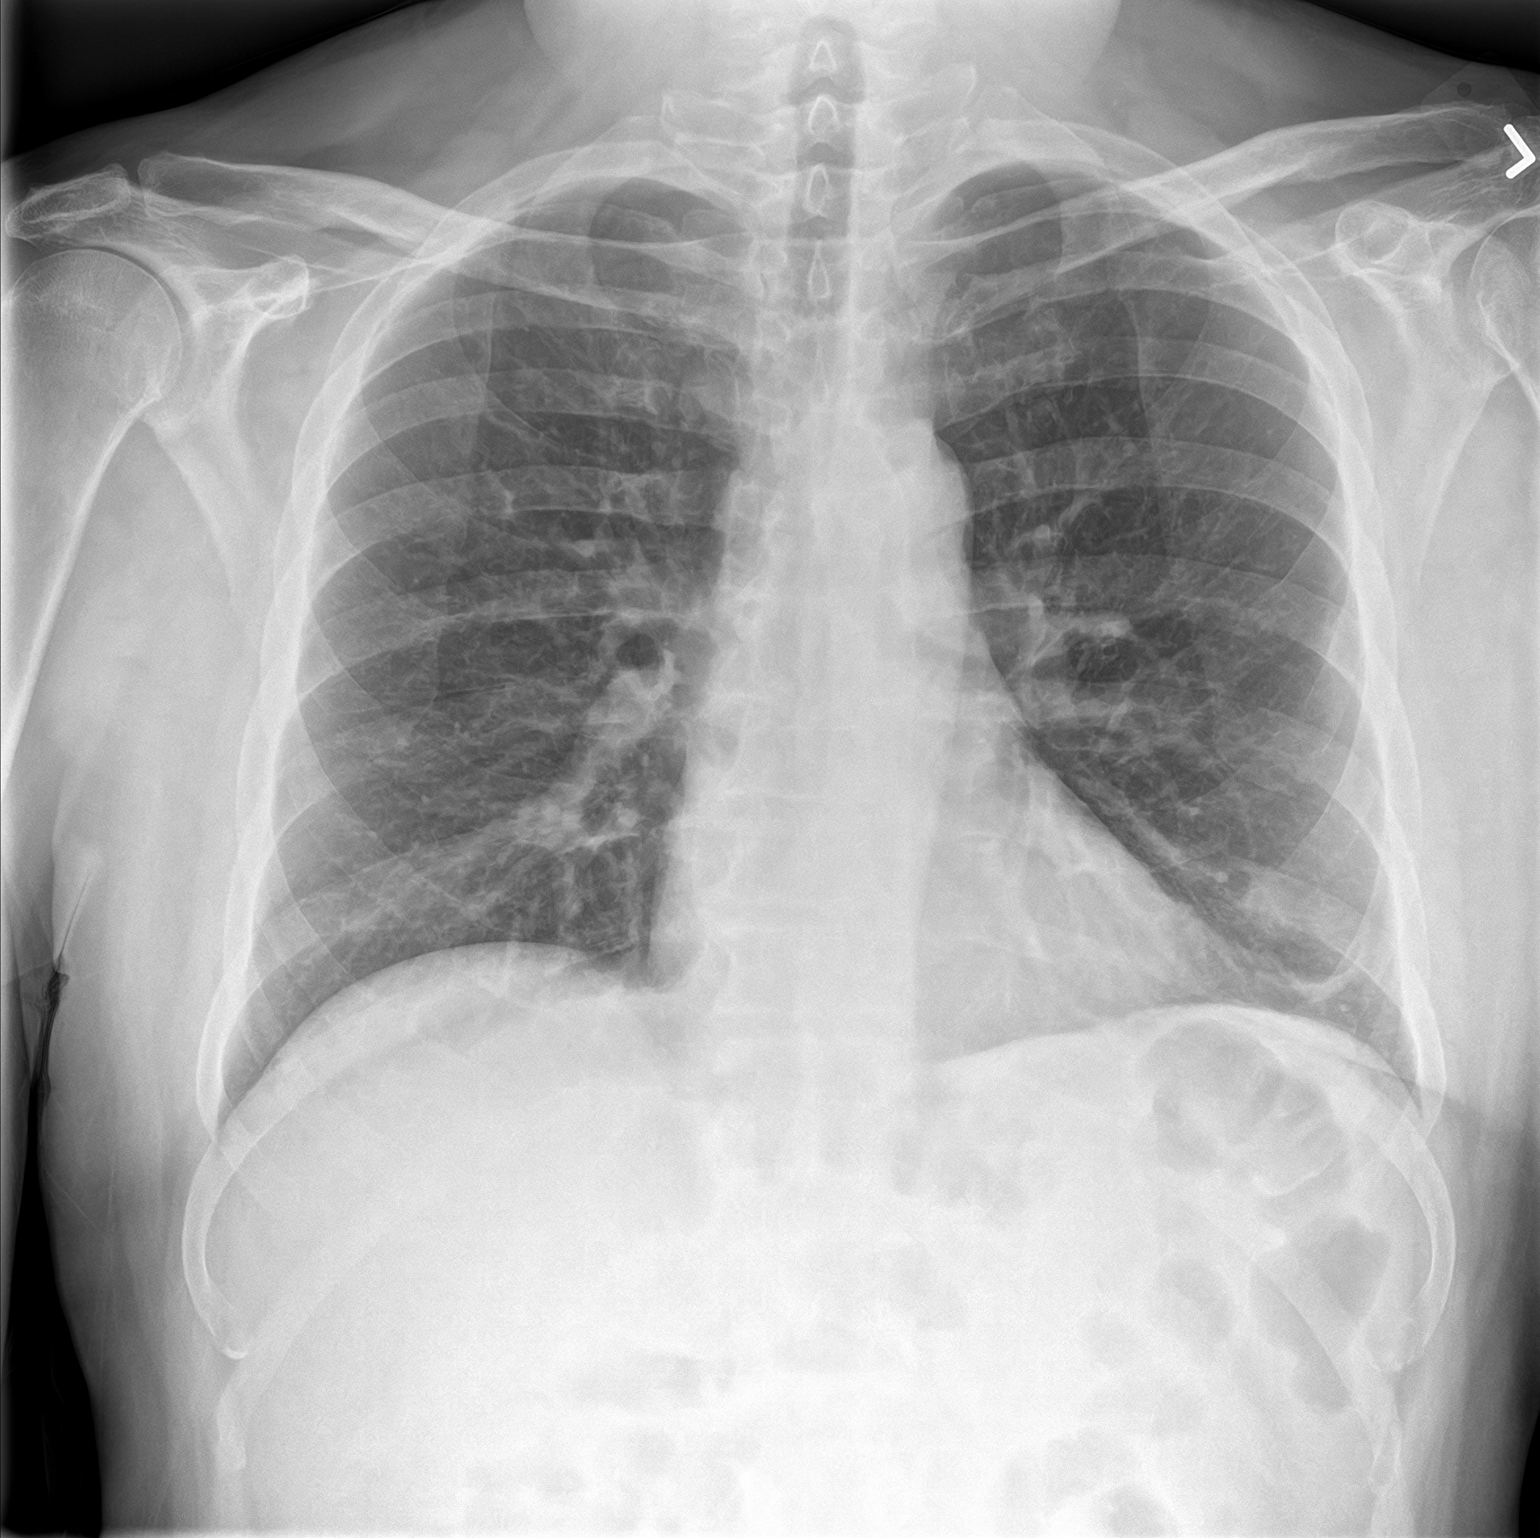
[im 2/2]
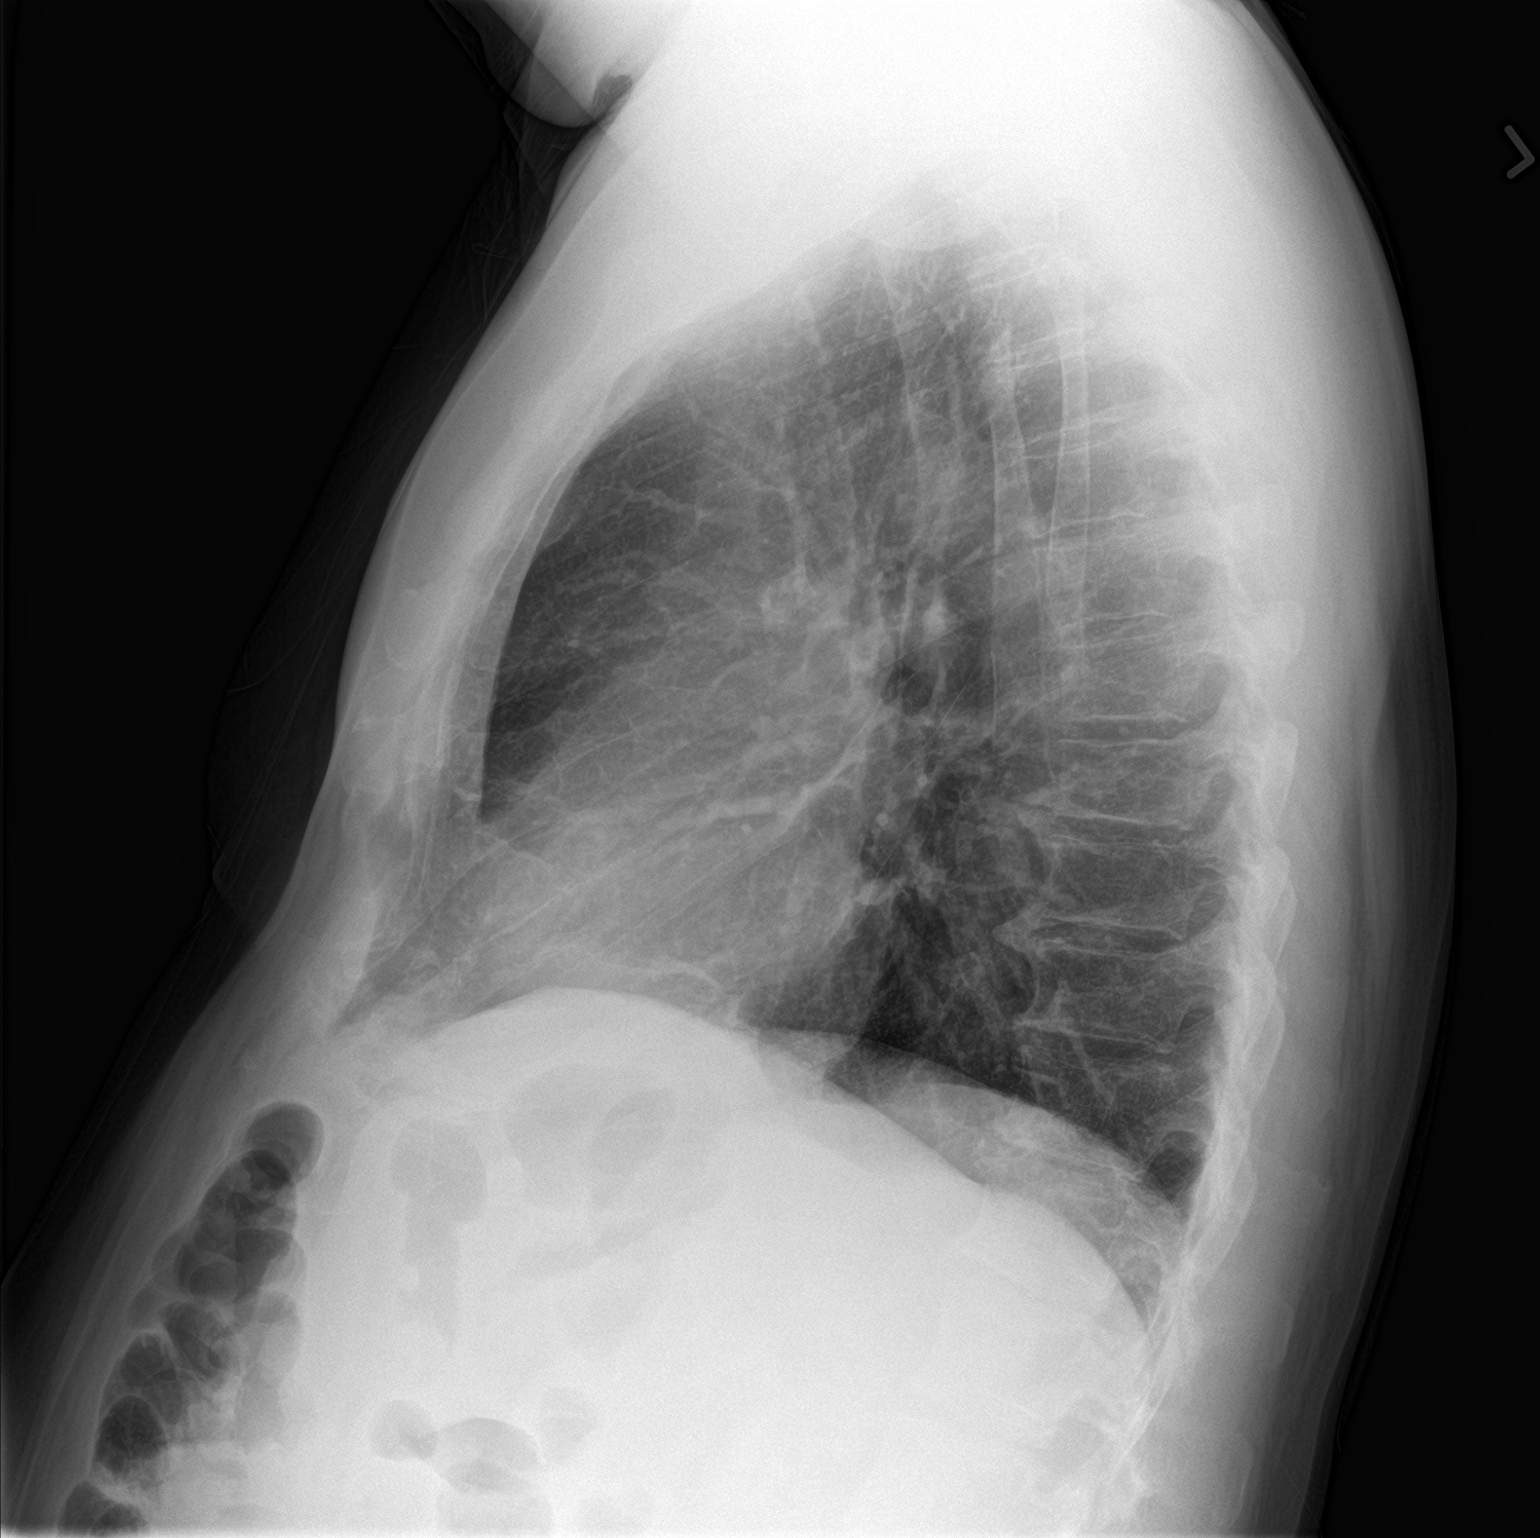

[2 of 2 positions shown; findings below may reference images not displayed]

FINDINGS: Some streaky opacities in the lung bases likely reflect atelectasis.
Ill-defined opacity in the left lung base appears to reflect the
overlap of rib shadows. No consolidation, features of edema,
pneumothorax, or effusion. Pulmonary vascularity is normally
distributed. The cardiomediastinal contours are unremarkable. No
acute osseous or soft tissue abnormality.
IMPRESSION: Basilar atelectasis. Otherwise, no acute cardiopulmonary or
traumatic findings in the chest.

## 2021-01-11 IMAGING — CT CT ABD-PELV W/O CM
2 of 4 series · 15 of 46 positions shown, 17 images · non-contrast
Comparison: 03/03/2020

CLINICAL DATA: Right chest pain, prior cholecystectomy, history of
kidney stone

EXAM:
CT ABDOMEN AND PELVIS WITHOUT CONTRAST
TECHNIQUE: Multidetector CT imaging of the abdomen and pelvis was performed
following the standard protocol without IV contrast.

[Series 2: routine abd/pel wo · axial · 0.82mm/px · z∈[-1100,-585]mm · 12 of 113 slices shown, 14 images]
[im 5/113  soft-tissue]
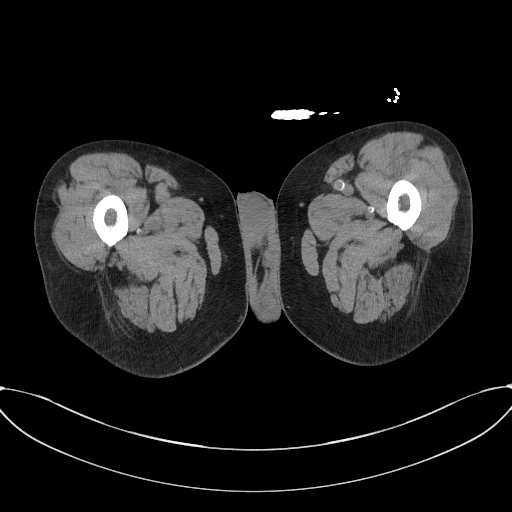
[im 5/113  bone]
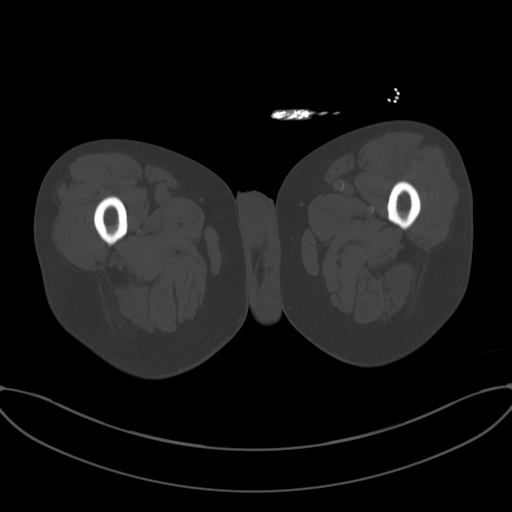
[im 15/113  soft-tissue]
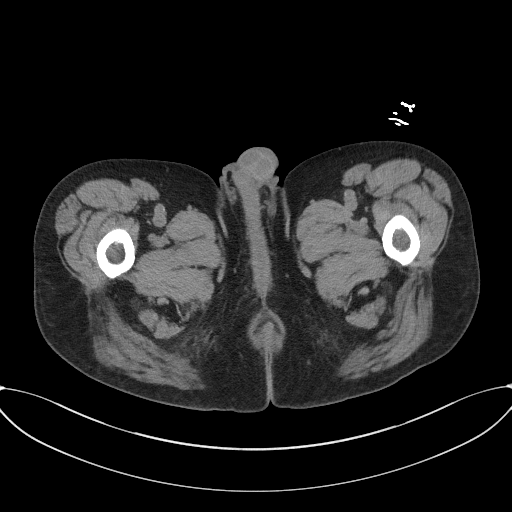
[im 24/113  soft-tissue]
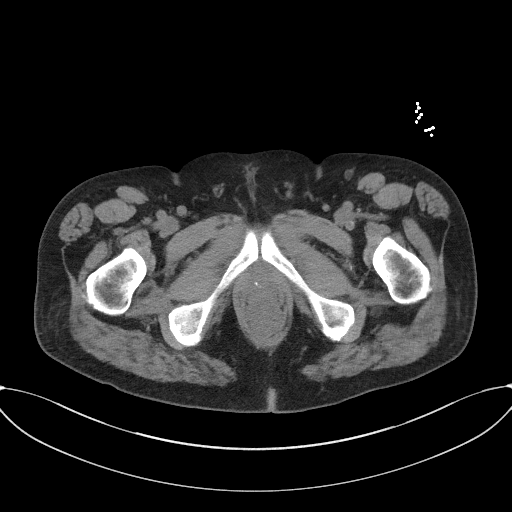
[im 33/113  soft-tissue]
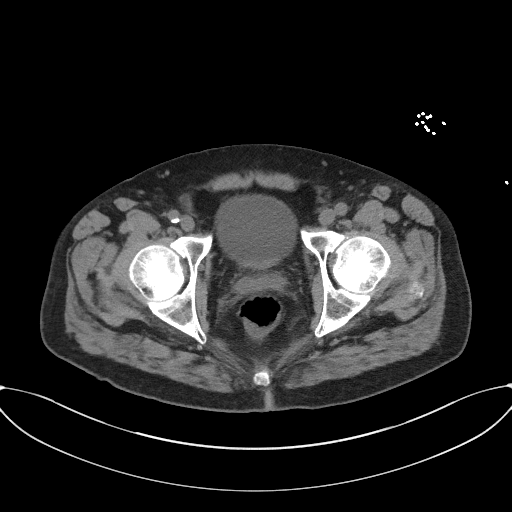
[im 43/113  soft-tissue]
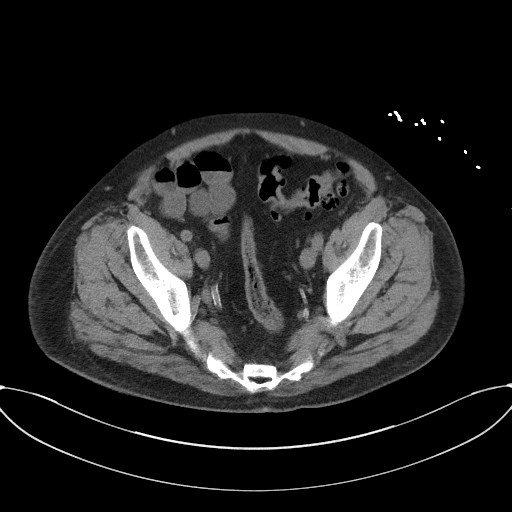
[im 52/113  soft-tissue]
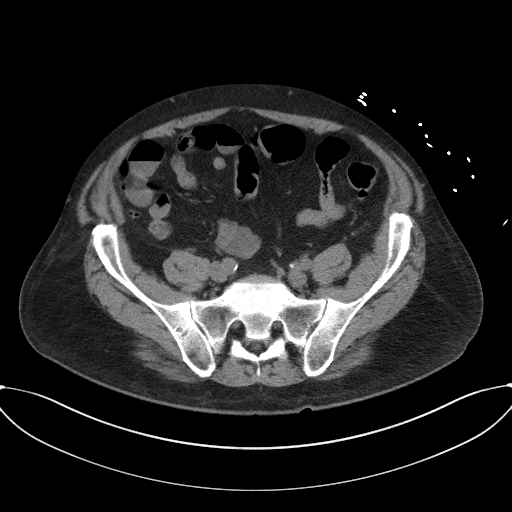
[im 61/113  soft-tissue]
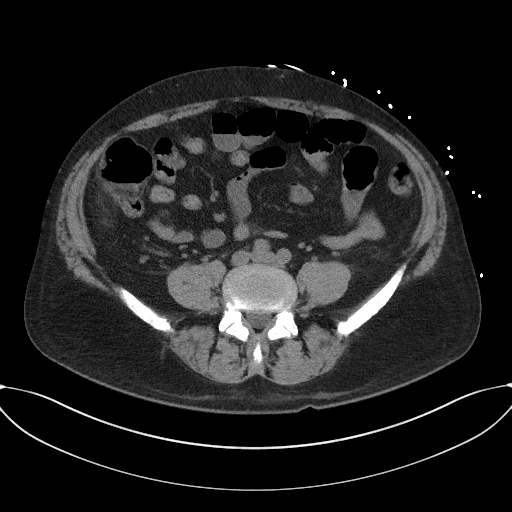
[im 71/113  soft-tissue]
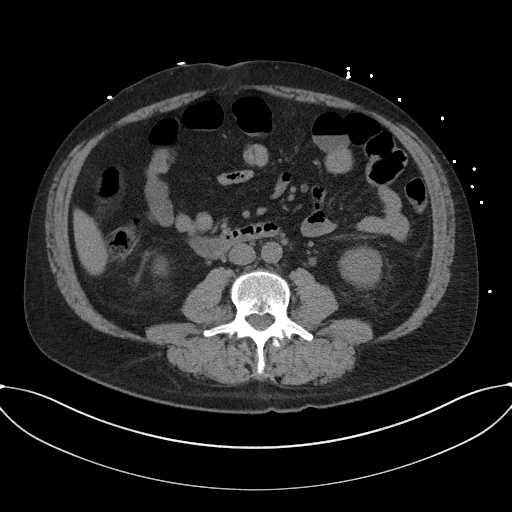
[im 80/113  soft-tissue]
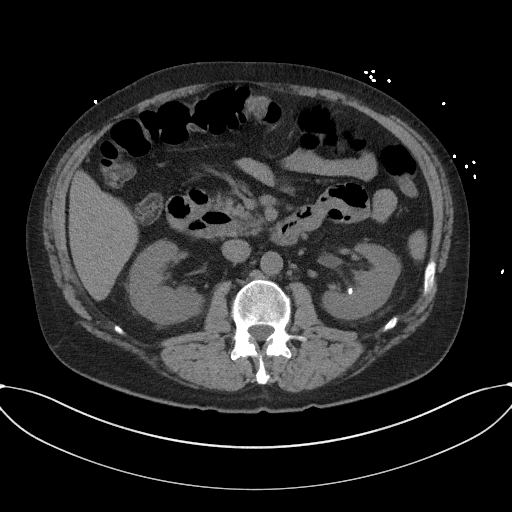
[im 80/113  bone]
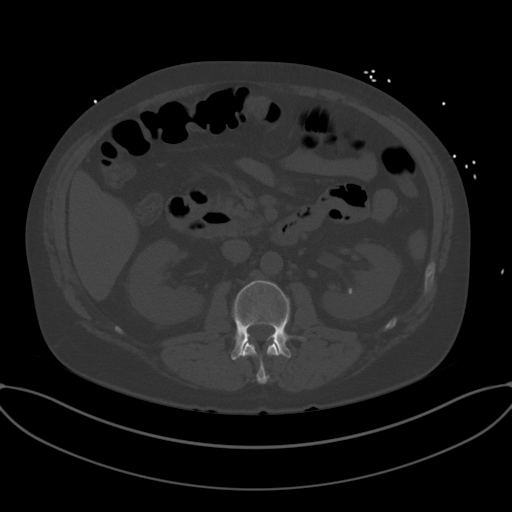
[im 89/113  soft-tissue]
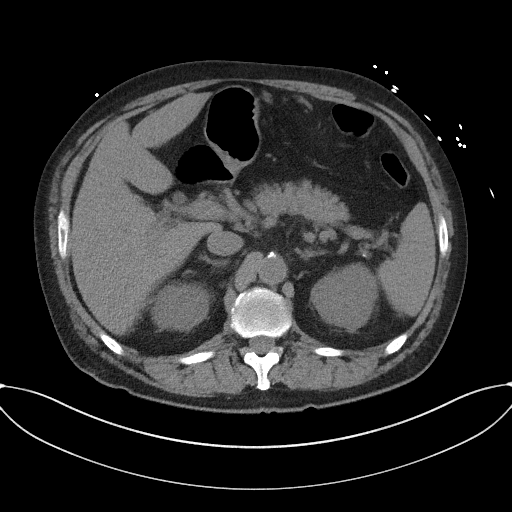
[im 99/113  soft-tissue]
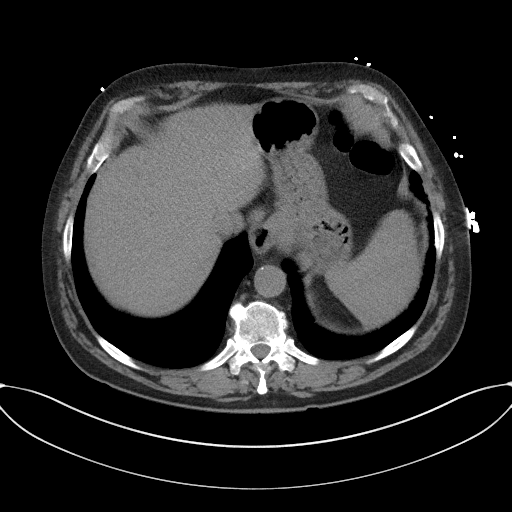
[im 108/113  soft-tissue]
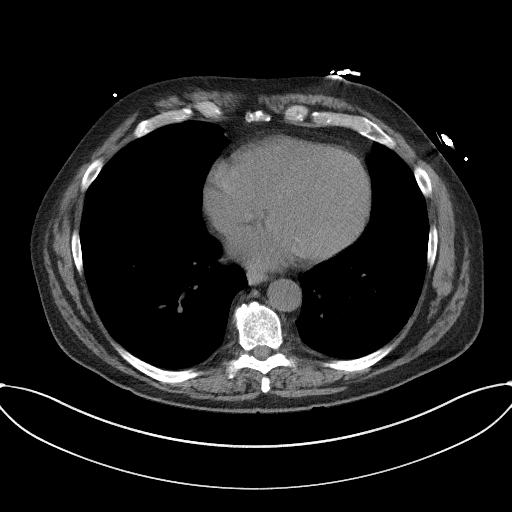

[Series 5: coronal st · coronal · 0.77mm/px · 3 of 99 slices shown]
[im 33/99  soft-tissue]
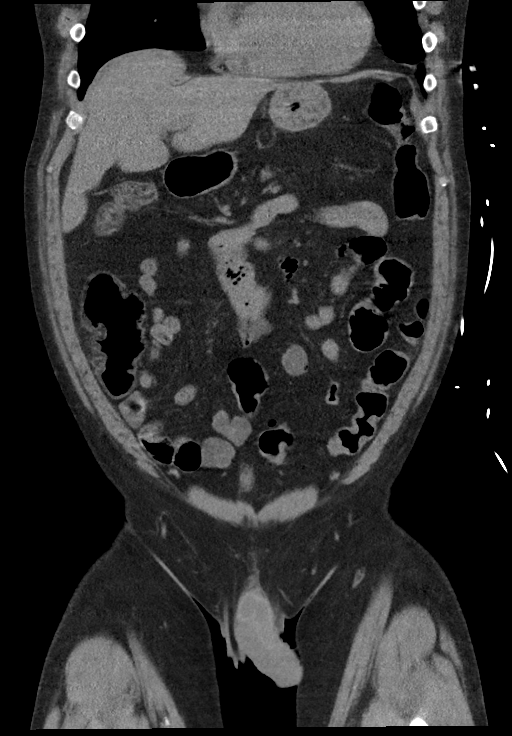
[im 44/99  soft-tissue]
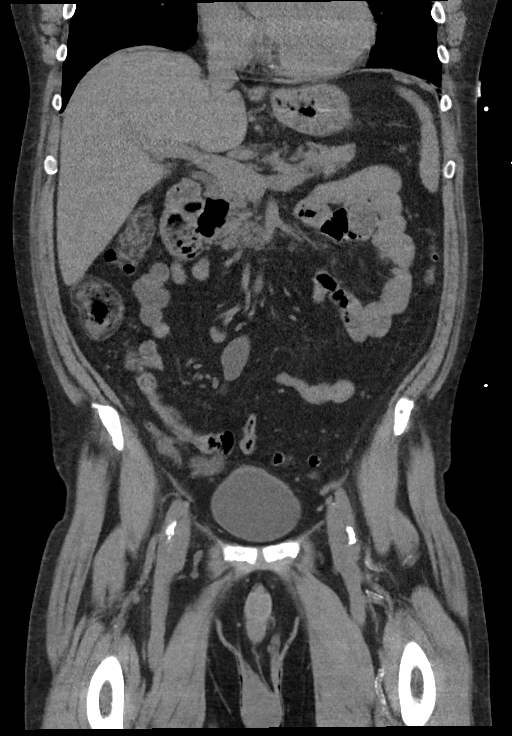
[im 55/99  soft-tissue]
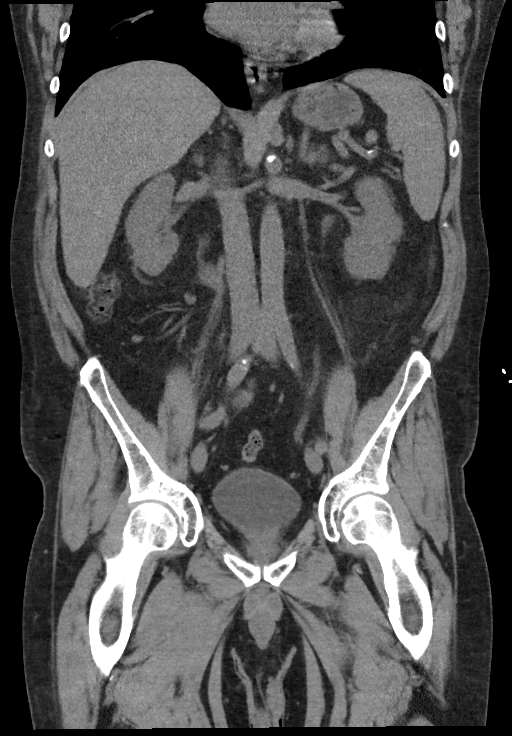

[15 of 46 positions shown; findings below may reference images not displayed]

FINDINGS: Lower chest: Lung bases are clear.

Hepatobiliary: Unenhanced liver is unremarkable.

Status post cholecystectomy. No intrahepatic or extrahepatic ductal
dilatation.

Pancreas: Within normal limits.

Spleen: Within normal limits.

Adrenals/Urinary Tract: Adrenal glands are within normal limits.

5 nonobstructing right renal calculi, measuring up to 4 mm in the
right lower pole (series 2/image 39). Four nonobstructing left renal
calculi, measuring up to 5 mm in the left upper pole (series 2/image
27). No ureteral or bladder calculi. No hydronephrosis.

Bladder is within normal limits.

Stomach/Bowel: Tiny hiatal hernia.

No evidence of bowel obstruction. Scattered small duodenal
diverticuli.

Normal appendix (series 2/image 63).

Scattered sigmoid diverticulosis, without evidence of
diverticulitis.

Vascular/Lymphatic: No evidence of abdominal aortic aneurysm.

Atherosclerotic calcifications of the abdominal aorta and branch
vessels.

No suspicious abdominopelvic lymphadenopathy.

Reproductive: Mild prostatomegaly.

Other: No abdominopelvic ascites.

Musculoskeletal: Degenerative changes of the visualized
thoracolumbar spine. No rib fracture is seen.
IMPRESSION: Multiple bilateral nonobstructing renal calculi measuring up to 5
mm. No ureteral or bladder calculi. No hydronephrosis.

No evidence of bowel obstruction.  Normal appendix.

Additional ancillary findings as above.

## 2021-01-11 IMAGING — NM NM PULMONARY VENT & PERF
2 series · 16 of 16 positions shown · non-contrast
Comparison: Chest radiographs obtained yesterday.

CLINICAL DATA: Chest pain.

EXAM:
NUCLEAR MEDICINE VENTILATION - PERFUSION LUNG SCAN
TECHNIQUE: Ventilation images were obtained in multiple projections using
inhaled aerosol Nc-OOm DTPA. Perfusion images were obtained in
multiple projections after intravenous injection of Nc-OOm MAA.
RADIOPHARMACEUTICALS:  32.65 mCi of Nc-OOm DTPA aerosol inhalation
and 4.35 mCi PcNNm MAA IV

[Series 1000: lung perfusion · 1.95mm/px · 4 acquisitions, 8 frames shown]
[im 1/4]
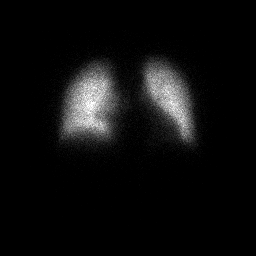
[im 1/4]
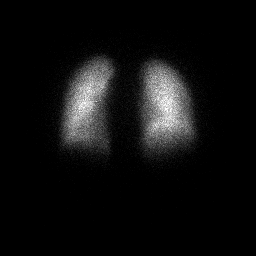
[im 2/4]
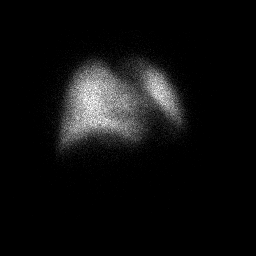
[im 2/4]
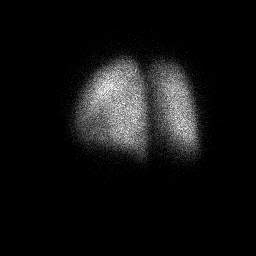
[im 3/4]
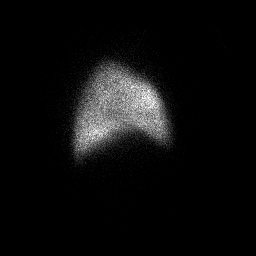
[im 3/4]
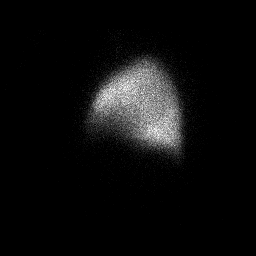
[im 4/4]
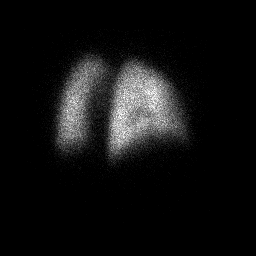
[im 4/4]
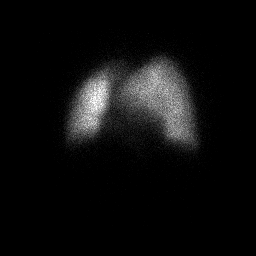

[Series 1000: lung ventilation · 3.90mm/px · 4 acquisitions, 8 frames shown]
[im 1/4]
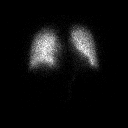
[im 1/4]
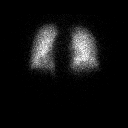
[im 2/4]
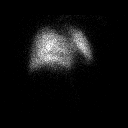
[im 2/4]
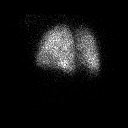
[im 3/4]
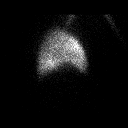
[im 3/4]
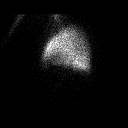
[im 4/4]
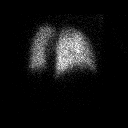
[im 4/4]
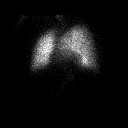

[16 of 16 positions shown; findings below may reference images not displayed]

FINDINGS: Ventilation: Normal ventilation of both lungs. No focal ventilation
defect.

Perfusion: Normal perfusion of both lungs. No wedge shaped
peripheral perfusion defects to suggest acute pulmonary embolism.
IMPRESSION: Normal examination.  No evidence of pulmonary embolism.

## 2021-01-15 ENCOUNTER — Emergency Department
Admission: EM | Admit: 2021-01-15 | Discharge: 2021-01-15 | Disposition: A | Discharge status: Home / Self Care | Payer: Medicaid Other | Attending: Emergency Medicine | Admitting: Emergency Medicine

## 2021-01-15 ENCOUNTER — Encounter: Payer: Self-pay | Admitting: Emergency Medicine

## 2021-01-15 ENCOUNTER — Other Ambulatory Visit: Payer: Self-pay

## 2021-01-15 ENCOUNTER — Emergency Department: Payer: Medicaid Other

## 2021-01-15 DIAGNOSIS — J45909 Unspecified asthma, uncomplicated: Secondary | ICD-10-CM | POA: Insufficient documentation

## 2021-01-15 DIAGNOSIS — K219 Gastro-esophageal reflux disease without esophagitis: Secondary | ICD-10-CM | POA: Insufficient documentation

## 2021-01-15 DIAGNOSIS — Z79899 Other long term (current) drug therapy: Secondary | ICD-10-CM | POA: Insufficient documentation

## 2021-01-15 DIAGNOSIS — I10 Essential (primary) hypertension: Secondary | ICD-10-CM | POA: Insufficient documentation

## 2021-01-15 DIAGNOSIS — Z7952 Long term (current) use of systemic steroids: Secondary | ICD-10-CM | POA: Insufficient documentation

## 2021-01-15 DIAGNOSIS — R0789 Other chest pain: Secondary | ICD-10-CM

## 2021-01-15 DIAGNOSIS — Z87891 Personal history of nicotine dependence: Secondary | ICD-10-CM | POA: Insufficient documentation

## 2021-01-15 DIAGNOSIS — J189 Pneumonia, unspecified organism: Secondary | ICD-10-CM | POA: Insufficient documentation

## 2021-01-15 LAB — BASIC METABOLIC PANEL
Anion gap: 10 (ref 5–15)
BUN: 12 mg/dL (ref 8–23)
CO2: 20 mmol/L — ABNORMAL LOW (ref 22–32)
Calcium: 9.4 mg/dL (ref 8.9–10.3)
Chloride: 107 mmol/L (ref 98–111)
Creatinine, Ser: 1.15 mg/dL (ref 0.61–1.24)
GFR, Estimated: >60 mL/min (ref >60)
Glucose, Bld: 105 mg/dL — ABNORMAL HIGH (ref 70–99)
Potassium: 4.2 mmol/L (ref 3.5–5.1)
Sodium: 137 mmol/L (ref 135–145)

## 2021-01-15 LAB — CBC
HCT: 37.8 % — ABNORMAL LOW (ref 39.0–52.0)
Hemoglobin: 12.3 g/dL — ABNORMAL LOW (ref 13.0–17.0)
MCH: 28.6 pg (ref 26.0–34.0)
MCHC: 32.5 g/dL (ref 30.0–36.0)
MCV: 87.9 fL (ref 80.0–100.0)
Platelets: 197 10*3/uL (ref 150–400)
RBC: 4.3 MIL/uL (ref 4.22–5.81)
RDW: 18.4 % — ABNORMAL HIGH (ref 11.5–15.5)
WBC: 8.5 10*3/uL (ref 4.0–10.5)
nRBC: 0 % (ref 0.0–0.2)

## 2021-01-15 LAB — TROPONIN I (HIGH SENSITIVITY): Troponin I (High Sensitivity): 3 ng/L (ref <18)

## 2021-01-15 NOTE — Discharge Instructions (Addendum)
Please start taking your antireflux medications at night rather than first thing in the morning

## 2021-01-15 NOTE — ED Provider Notes (Signed)
[facility] Emergency Department Provider Note   ____________________________________________   Event Date/Time   First MD Initiated Contact with Patient 01/15/21 1901     (approximate)  I have reviewed the triage vital signs and the nursing notes.   HISTORY  Chief Complaint Chest Pain    HPI Miguel [name] is a 60 y.o. male with a past medical history of significant alcohol abuse, GERD, and hypertension who presents for left-sided, sharp, intermittent chest pain that is described as 10/10, lasting for approximately 5-10 seconds and resolving spontaneously.  Patient states that this pain is worsened with any movement or taking a deep breath.  Patient denies any relieving factors.  Patient currently denies any vision changes, tinnitus, difficulty speaking, facial droop, sore throat, shortness of breath, abdominal pain, nausea/vomiting/diarrhea, dysuria, or weakness/numbness/paresthesias in any extremity         Past Medical History:  Diagnosis Date  . Alcohol abuse   . Anxiety   . Asthma   . Family history of breast cancer   . GERD (gastroesophageal reflux disease)   . Gout   . Hx of colonic polyps   . Hypertension   . Kidney stone   . 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 OCD (obsessive compulsive disorder)   . Renal colic     Patient Active Problem List   Diagnosis Date Noted  . Hypotension 11/28/2020  . Genetic testing 07/15/2020  . 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 Right ankle pain 06/26/2020  . Family history of breast cancer   . Hx of colonic polyps   . Hospital discharge follow-up 06/05/2020  . Kidney function test abnormal 06/05/2020  . Abdominal pain 04/12/2020  . Hypertensive urgency 04/12/2020  . Alcohol withdrawal ([place]) 03/04/2020  . Right-sided chest wall pain 02/28/2020  . Alcohol abuse with alcohol-induced mood disorder ([place]) 12/02/2019  . Moderate episode of recurrent major depressive  disorder ([place])   . Health care maintenance 10/26/2019  . Right knee pain 10/26/2019  . Generalized anxiety disorder 10/14/2019  . MDD (major depressive disorder), recurrent severe, without psychosis ([place]) 07/27/2019  . Social anxiety disorder 07/27/2019  . Left-sided chest wall pain 06/14/2019  . Alcoholic cirrhosis of liver without ascites ([place]) 06/14/2019  . Alcohol abuse 06/14/2019  . AKI (acute kidney injury) ([place]) 05/27/2019  . Alcohol withdrawal syndrome without complication ([place]) 11/08/4974  . Alcoholic intoxication without complication ([place])   . Sepsis ([place]) 03/08/2019  . Breast lump or mass 10/04/2018  . Sepsis secondary to UTI ([place]) 09/14/2018  . Asthma 07/07/2018  . GERD (gastroesophageal reflux disease) 07/07/2018  . OCD (obsessive compulsive disorder) 07/07/2018  . Self-inflicted laceration of left wrist ([place]) 03/10/2018  . Leg hematoma 12/25/2017  . Suicide and self-inflicted injury by cutting and piercing instrument ([place]) 03/10/2017  . Severe recurrent major 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 ([place]) 03/09/2017  . Substance induced mood disorder ([place]) 08/15/2016  . Involuntary commitment 08/15/2016  . Alcohol use disorder, severe, dependence ([place]) 02/05/2016  . Hypertension 12/05/2015  . Tachycardia 12/05/2015  . Gout 11/13/2015  . Chronic back pain 05/02/2015    Past Surgical History:  Procedure Laterality Date  . CHOLECYSTECTOMY  2012  . COLONOSCOPY    . COLONOSCOPY WITH PROPOFOL N/A 05/28/2020   Procedure: COLONOSCOPY WITH PROPOFOL;  Surgeon: [name], MD;  Location: [facility] ENDOSCOPY;  Service: Gastroenterology;  Laterality: N/A;  . EXTRACORPOREAL SHOCK WAVE LITHOTRIPSY Left 01/12/2019   Procedure: EXTRACORPOREAL SHOCK  WAVE LITHOTRIPSY (ESWL);  Surgeon: Billey Co, MD;  Location: [facility] ORS;  Service: Urology;  Laterality: Left;  . UPPER GI ENDOSCOPY      Prior to Admission medications   Medication Sig Start Date End Date Taking? Authorizing Provider   allopurinol (ZYLOPRIM) 300 MG tablet TAKE ONE TABLET BY MOUTH EVERY DAY 09/18/20   Iloabachie, Chioma E, NP  ascorbic acid (VITAMIN C) 500 MG tablet Take by mouth. 08/01/19   [provider]  citalopram (CELEXA) 20 MG tablet Take 1 tablet (20 mg total) by mouth daily. 03/21/20 03/21/21  Clapacs, Madie Reno, MD  fexofenadine-pseudoephedrine (ALLEGRA-D) 60-120 MG 12 hr tablet Take 1 tablet by mouth 2 (two) times daily. 12/31/20   Sable Feil, PA-C  Fluticasone-Salmeterol (ADVAIR DISKUS) 100-50 MCG/DOSE AEPB Inhale 1 puff into the lungs 2 (two) times daily. 03/21/20   Clapacs, Madie Reno, MD  folic acid (FOLVITE) 1 MG tablet TAKE ONE TABLET BY MOUTH EVERY DAY 01/07/21   Iloabachie, Chioma E, NP  lisinopril (ZESTRIL) 40 MG tablet TAKE ONE TABLET BY MOUTH EVERY DAY 09/18/20   Iloabachie, Chioma E, NP  metoprolol tartrate (LOPRESSOR) 25 MG tablet Take 1 tablet (25 mg total) by mouth daily. 11/30/20 12/30/20  Wyvonnia Dusky, MD  pantoprazole (PROTONIX) 40 MG tablet Take 1 tablet (40 mg total) by mouth daily. 10/15/20   Iloabachie, Chioma E, NP  sulfamethoxazole-trimethoprim (BACTRIM DS) 800-160 MG tablet Take 1 tablet by mouth 2 (two) times daily. 12/31/20   Sable Feil, PA-C  thiamine 100 MG tablet Take 1 tablet (100 mg total) by mouth daily. 10/22/20   Iloabachie, Chioma E, NP  VENTOLIN HFA 108 (90 Base) MCG/ACT inhaler INHALE 2 PUFFS EVERY 4 HOURS AS NEEDED 10/22/20   Iloabachie, Chioma E, NP    Allergies Percocet [oxycodone-acetaminophen]  Family History  Problem Relation Age of Onset  . Alcohol abuse Father   . Breast cancer Mother 73    Social History Social History   Tobacco Use  . Smoking status: Former Smoker    Types: Cigarettes  . Smokeless tobacco: Never Used  . Tobacco comment: quit 30 years ago  Vaping Use  . Vaping Use: Never used  Substance Use Topics  . Alcohol use: Yes    Alcohol/week: 12.0 standard drinks    Types: 12 Shots of liquor per week  . Drug use: No     Review of Systems Constitutional: No fever/chills Eyes: No visual changes. ENT: No sore throat. Cardiovascular: Endorses chest pain. Respiratory: Denies shortness of breath. Gastrointestinal: No abdominal pain.  No nausea, no vomiting.  No diarrhea. Genitourinary: Negative for dysuria. Musculoskeletal: Negative for acute arthralgias Skin: Negative for rash. Neurological: Negative for headaches, weakness/numbness/paresthesias in any extremity Psychiatric: Negative for suicidal ideation/homicidal ideation   ____________________________________________   PHYSICAL EXAM:  VITAL SIGNS: ED Triage Vitals  Enc Vitals Group     BP 01/15/21 1640 97/75     Pulse Rate 01/15/21 1640 95     Resp 01/15/21 1640 18     Temp 01/15/21 1640 97.8 F (36.6 C)     Temp Source 01/15/21 1640 Oral     SpO2 01/15/21 1640 98 %     Weight 01/15/21 1645 185 lb (83.9 kg)     Height 01/15/21 1645 6' (1.829 m)     Head Circumference --      Peak Flow --      Pain Score 01/15/21 1645 8     Pain Loc --  Pain Edu? --      Excl. in [place]? --    Constitutional: Alert and oriented. Well appearing and in no acute distress. Eyes: Conjunctivae are normal. PERRL. Head: Atraumatic. Nose: No congestion/rhinnorhea. Mouth/Throat: Mucous membranes are moist. Neck: No stridor Cardiovascular: Grossly normal heart sounds.  Good peripheral circulation. Respiratory: Normal respiratory effort.  No retractions. Gastrointestinal: Soft and nontender. No distention. Musculoskeletal: No obvious deformities Neurologic:  Normal speech and language. No gross focal neurologic deficits are appreciated. Skin:  Skin is warm and dry. No rash noted. Psychiatric: Mood and affect are normal. Speech and behavior are normal.  ____________________________________________   LABS (all labs ordered are listed, but only abnormal results are displayed)  Labs Reviewed  BASIC METABOLIC PANEL - Abnormal; Notable for the following  components:      Result Value   CO2 20 (*)    Glucose, Bld 105 (*)    All other components within normal limits  CBC - Abnormal; Notable for the following components:   Hemoglobin 12.3 (*)    HCT 37.8 (*)    RDW 18.4 (*)    All other components within normal limits  TROPONIN I (HIGH SENSITIVITY)   ____________________________________________  EKG  ED ECG REPORT I, Naaman Plummer, the attending physician, personally viewed and interpreted this ECG.  Date: 01/15/2021 EKG Time: 1642 Rate: 98 Rhythm: normal sinus rhythm QRS Axis: normal Intervals: normal ST/T Wave abnormalities: normal Narrative Interpretation: no evidence of acute ischemia  ____________________________________________  RADIOLOGY  ED MD interpretation: 2 view chest x-ray shows no evidence of acute abnormalities including no pneumonia, pneumothorax, or widened mediastinum.  Of note does show some minimal bronchitic changes with minimal lingular atelectasis  Official radiology report(s): DG Chest 2 View  Result Date: 01/15/2021 CLINICAL DATA:  Chest pain EXAM: CHEST - 2 VIEW COMPARISON:  12/31/2020, 12/11/2020 FINDINGS: Minimal bronchitic change. Minimal atelectasis at the lingula. Stable cardiomediastinal silhouette. No pneumothorax. IMPRESSION: Minimal bronchitic change with minimal lingular atelectasis. Electronically Signed   By: Donavan Foil M.D.   On: 01/15/2021 17:18    ____________________________________________   PROCEDURES  Procedure(s) performed (including Critical Care):  .1-3 Lead EKG Interpretation Performed by: Naaman Plummer, MD Authorized by: Naaman Plummer, MD     Interpretation: normal     ECG rate:  94   ECG rate assessment: normal     Rhythm: sinus rhythm     Ectopy: none     Conduction: normal       ____________________________________________   INITIAL IMPRESSION / ASSESSMENT AND PLAN / ED COURSE  As part of my medical decision making, I reviewed the following data  within the [place] notes reviewed and incorporated, Labs reviewed, EKG interpreted, Old chart reviewed, Radiograph reviewed and Notes from prior ED visits reviewed and incorporated        Workup: ECG, CXR, CBC, BMP, Troponin Findings: ECG: No overt evidence of STEMI. No evidence of Brugadas sign, delta wave, epsilon wave, significantly prolonged QTc, or malignant arrhythmia HS Troponin: Negative x1 Other Labs unremarkable for emergent problems. CXR: Without PTX, PNA, or widened mediastinum Last Stress Test: Unknown Last Heart Catheterization: Unknown HEART Score: 2  Given History, Exam, and Workup I have low suspicion for ACS, Pneumothorax, Pneumonia, Pulmonary Embolus, Tamponade, Aortic Dissection or other emergent problem as a cause for this presentation.   Reassesment: Prior to discharge patients pain was controlled and they were well appearing.  Disposition:  Discharge. Strict return precautions discussed with patient  with full understanding. Advised patient to follow up promptly with primary care provider      ____________________________________________   FINAL CLINICAL IMPRESSION(S) / ED DIAGNOSES  Final diagnoses:  Intermittent left-sided chest pain  Pneumonitis     ED Discharge Orders    None       Note:  This document was prepared using Dragon voice recognition software and may include unintentional dictation errors.   Naaman Plummer, MD 01/15/21 [id number]

## 2021-01-15 NOTE — ED Notes (Signed)
Pt reports some left sided chest pain since yesterday. Pt reports pain is intermittent. Pt denies other sx's. Pt does reports being tired as well.

## 2021-01-15 NOTE — ED Triage Notes (Signed)
Pt comes into the ED via POV c/o left side chest pain that started yesterday.  Pt states the pain comes and goes but is sharp is nature.  Pt denies any SHOB, nausea, or dizziness.  Pt is tearful in triage but has even and unlabored respirations.  Pt does present with come nasal congestion.

## 2021-01-15 NOTE — ED Notes (Signed)
Pt verbalizes understanding of d/c instructions and follow up. 

## 2021-01-17 ENCOUNTER — Emergency Department: Payer: Self-pay

## 2021-01-17 ENCOUNTER — Emergency Department
Admission: EM | Admit: 2021-01-17 | Discharge: 2021-01-17 | Discharge status: Against Medical Advice | Payer: Self-pay | Attending: Emergency Medicine | Admitting: Emergency Medicine

## 2021-01-17 ENCOUNTER — Other Ambulatory Visit: Payer: Self-pay

## 2021-01-17 DIAGNOSIS — Z20822 Contact with and (suspected) exposure to covid-19: Secondary | ICD-10-CM | POA: Insufficient documentation

## 2021-01-17 DIAGNOSIS — F10920 Alcohol use, unspecified with intoxication, uncomplicated: Secondary | ICD-10-CM

## 2021-01-17 DIAGNOSIS — I10 Essential (primary) hypertension: Secondary | ICD-10-CM | POA: Insufficient documentation

## 2021-01-17 DIAGNOSIS — F10129 Alcohol abuse with intoxication, unspecified: Secondary | ICD-10-CM | POA: Insufficient documentation

## 2021-01-17 DIAGNOSIS — Z79899 Other long term (current) drug therapy: Secondary | ICD-10-CM | POA: Insufficient documentation

## 2021-01-17 DIAGNOSIS — J45909 Unspecified asthma, uncomplicated: Secondary | ICD-10-CM | POA: Insufficient documentation

## 2021-01-17 DIAGNOSIS — Z87891 Personal history of nicotine dependence: Secondary | ICD-10-CM | POA: Insufficient documentation

## 2021-01-17 DIAGNOSIS — Y908 Blood alcohol level of 240 mg/100 ml or more: Secondary | ICD-10-CM | POA: Insufficient documentation

## 2021-01-17 LAB — CBC WITH DIFFERENTIAL/PLATELET
Abs Immature Granulocytes: 0.02 10*3/uL (ref 0.00–0.07)
Basophils Absolute: 0.1 10*3/uL (ref 0.0–0.1)
Basophils Relative: 1 %
Eosinophils Absolute: 0.1 10*3/uL (ref 0.0–0.5)
Eosinophils Relative: 1 %
HCT: 40.5 % (ref 39.0–52.0)
Hemoglobin: 12.8 g/dL — ABNORMAL LOW (ref 13.0–17.0)
Immature Granulocytes: 0 %
Lymphocytes Relative: 29 %
Lymphs Abs: 2 10*3/uL (ref 0.7–4.0)
MCH: 27.9 pg (ref 26.0–34.0)
MCHC: 31.6 g/dL (ref 30.0–36.0)
MCV: 88.2 fL (ref 80.0–100.0)
Monocytes Absolute: 0.7 10*3/uL (ref 0.1–1.0)
Monocytes Relative: 10 %
Neutro Abs: 4 10*3/uL (ref 1.7–7.7)
Neutrophils Relative %: 59 %
Platelets: 207 10*3/uL (ref 150–400)
RBC: 4.59 MIL/uL (ref 4.22–5.81)
RDW: 18.6 % — ABNORMAL HIGH (ref 11.5–15.5)
WBC: 6.9 10*3/uL (ref 4.0–10.5)
nRBC: 0 % (ref 0.0–0.2)

## 2021-01-17 LAB — COMPREHENSIVE METABOLIC PANEL
ALT: 14 U/L (ref 0–44)
AST: 15 U/L (ref 15–41)
Albumin: 4.4 g/dL (ref 3.5–5.0)
Alkaline Phosphatase: 74 U/L (ref 38–126)
Anion gap: 8 (ref 5–15)
BUN: 20 mg/dL (ref 8–23)
CO2: 22 mmol/L (ref 22–32)
Calcium: 9.4 mg/dL (ref 8.9–10.3)
Chloride: 106 mmol/L (ref 98–111)
Creatinine, Ser: 1.11 mg/dL (ref 0.61–1.24)
GFR, Estimated: >60 mL/min (ref >60)
Glucose, Bld: 110 mg/dL — ABNORMAL HIGH (ref 70–99)
Potassium: 5.4 mmol/L — ABNORMAL HIGH (ref 3.5–5.1)
Sodium: 136 mmol/L (ref 135–145)
Total Bilirubin: 0.6 mg/dL (ref 0.3–1.2)
Total Protein: 7.6 g/dL (ref 6.5–8.1)

## 2021-01-17 LAB — RESP PANEL BY RT-PCR (FLU A&B, COVID) ARPGX2
Influenza A by PCR: NEGATIVE
Influenza B by PCR: NEGATIVE
SARS Coronavirus 2 by RT PCR: NEGATIVE

## 2021-01-17 LAB — ETHANOL: Alcohol, Ethyl (B): 312 mg/dL (ref <10)

## 2021-01-17 LAB — LIPASE, BLOOD: Lipase: 45 U/L (ref 11–51)

## 2021-01-17 MED ORDER — SODIUM CHLORIDE 0.9 % IV BOLUS
1000.0000 mL | Freq: Once | INTRAVENOUS | Status: AC
  Administered 2021-01-17: 1000 mL via INTRAVENOUS

## 2021-01-17 MED ORDER — IBUPROFEN 600 MG PO TABS
600.0000 mg | ORAL_TABLET | Freq: Once | ORAL | Status: AC
  Administered 2021-01-17: 600 mg via ORAL
  Filled 2021-01-17: qty 1

## 2021-01-17 MED ORDER — ONDANSETRON HCL 4 MG/2ML IJ SOLN
4.0000 mg | Freq: Once | INTRAMUSCULAR | Status: AC
  Administered 2021-01-17: 4 mg via INTRAVENOUS
  Filled 2021-01-17: qty 2

## 2021-01-17 NOTE — ED Triage Notes (Signed)
From home with [facility]. +Etoh. [address] outside today, reports LOC. States he "woke up and went back into house."

## 2021-01-17 NOTE — ED Notes (Signed)
Pt demanding to have IV removed. IV removed, MD notified.

## 2021-01-17 NOTE — ED Provider Notes (Signed)
[facility] Emergency Department Provider Note  ____________________________________________  Time seen: Approximately 7:12 PM  I have reviewed the triage vital signs and the nursing notes.   HISTORY  Chief Complaint Fall and Alcohol Intoxication    Level 5 Caveat: Portions of the History and Physical including HPI and review of systems are unable to be completely obtained due to patient being a poor historian   HPI Miguel [name] is a 60 y.o. male with a history of hypertension GERD depression and daily alcohol abuse who is brought to the ED due to falling outside, losing consciousness for a period of time and then waking up and going back inside.  EMS report that he was ambulatory, has been drinking heavily today.  Denies any acute focal complaints.  No hallucinations or withdrawal symptoms.  No chest pain or shortness of breath.  No headache vision changes paresthesias or motor weakness.      Past Medical History:  Diagnosis Date  . Alcohol abuse   . Anxiety   . Asthma   . Family history of breast cancer   . GERD (gastroesophageal reflux disease)   . Gout   . Hx of colonic polyps   . Hypertension   . Kidney stone   . 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 OCD (obsessive compulsive disorder)   . Renal colic      Patient Active Problem List   Diagnosis Date Noted  . Hypotension 11/28/2020  . Genetic testing 07/15/2020  . 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 Right ankle pain 06/26/2020  . Family history of breast cancer   . Hx of colonic polyps   . Hospital discharge follow-up 06/05/2020  . Kidney function test abnormal 06/05/2020  . Abdominal pain 04/12/2020  . Hypertensive urgency 04/12/2020  . Alcohol withdrawal ([place]) 03/04/2020  . Right-sided chest wall pain 02/28/2020  . Alcohol abuse with alcohol-induced mood disorder ([place]) 12/02/2019  . Moderate episode of recurrent major depressive disorder ([place])   .  Health care maintenance 10/26/2019  . Right knee pain 10/26/2019  . Generalized anxiety disorder 10/14/2019  . MDD (major depressive disorder), recurrent severe, without psychosis ([place]) 07/27/2019  . Social anxiety disorder 07/27/2019  . Left-sided chest wall pain 06/14/2019  . Alcoholic cirrhosis of liver without ascites ([place]) 06/14/2019  . Alcohol abuse 06/14/2019  . AKI (acute kidney injury) ([place]) 05/27/2019  . Alcohol withdrawal syndrome without complication ([place]) 18/84/1660  . Alcoholic intoxication without complication ([place])   . Sepsis ([place]) 03/08/2019  . Breast lump or mass 10/04/2018  . Sepsis secondary to UTI ([place]) 09/14/2018  . Asthma 07/07/2018  . GERD (gastroesophageal reflux disease) 07/07/2018  . OCD (obsessive compulsive disorder) 07/07/2018  . Self-inflicted laceration of left wrist ([place]) 03/10/2018  . Leg hematoma 12/25/2017  . Suicide and self-inflicted injury by cutting and piercing instrument ([place]) 03/10/2017  . Severe recurrent major 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 ([place]) 03/09/2017  . Substance induced mood disorder ([place]) 08/15/2016  . Involuntary commitment 08/15/2016  . Alcohol use disorder, severe, dependence ([place]) 02/05/2016  . Hypertension 12/05/2015  . Tachycardia 12/05/2015  . Gout 11/13/2015  . Chronic back pain 05/02/2015     Past Surgical History:  Procedure Laterality Date  . CHOLECYSTECTOMY  2012  . COLONOSCOPY    . COLONOSCOPY WITH PROPOFOL N/A 05/28/2020   Procedure: COLONOSCOPY WITH PROPOFOL;  Surgeon: [name], MD;  Location: [facility] ENDOSCOPY;  Service: Gastroenterology;  Laterality: N/A;  .  EXTRACORPOREAL SHOCK WAVE LITHOTRIPSY Left 01/12/2019   Procedure: EXTRACORPOREAL SHOCK WAVE LITHOTRIPSY (ESWL);  Surgeon: Billey Co, MD;  Location: [facility] ORS;  Service: Urology;  Laterality: Left;  . UPPER GI ENDOSCOPY       Prior to Admission medications   Medication Sig Start Date End Date Taking? Authorizing Provider  allopurinol  (ZYLOPRIM) 300 MG tablet TAKE ONE TABLET BY MOUTH EVERY DAY 09/18/20   Iloabachie, Chioma E, NP  ascorbic acid (VITAMIN C) 500 MG tablet Take by mouth. 08/01/19   [provider]  citalopram (CELEXA) 20 MG tablet Take 1 tablet (20 mg total) by mouth daily. 03/21/20 03/21/21  Clapacs, Madie Reno, MD  fexofenadine-pseudoephedrine (ALLEGRA-D) 60-120 MG 12 hr tablet Take 1 tablet by mouth 2 (two) times daily. 12/31/20   Sable Feil, PA-C  Fluticasone-Salmeterol (ADVAIR DISKUS) 100-50 MCG/DOSE AEPB Inhale 1 puff into the lungs 2 (two) times daily. 03/21/20   Clapacs, Madie Reno, MD  folic acid (FOLVITE) 1 MG tablet TAKE ONE TABLET BY MOUTH EVERY DAY 01/07/21   Iloabachie, Chioma E, NP  lisinopril (ZESTRIL) 40 MG tablet TAKE ONE TABLET BY MOUTH EVERY DAY 09/18/20   Iloabachie, Chioma E, NP  metoprolol tartrate (LOPRESSOR) 25 MG tablet Take 1 tablet (25 mg total) by mouth daily. 11/30/20 12/30/20  Wyvonnia Dusky, MD  pantoprazole (PROTONIX) 40 MG tablet Take 1 tablet (40 mg total) by mouth daily. 10/15/20   Iloabachie, Chioma E, NP  sulfamethoxazole-trimethoprim (BACTRIM DS) 800-160 MG tablet Take 1 tablet by mouth 2 (two) times daily. 12/31/20   Sable Feil, PA-C  thiamine 100 MG tablet Take 1 tablet (100 mg total) by mouth daily. 10/22/20   Iloabachie, Chioma E, NP  VENTOLIN HFA 108 (90 Base) MCG/ACT inhaler INHALE 2 PUFFS EVERY 4 HOURS AS NEEDED 10/22/20   Iloabachie, Chioma E, NP     Allergies Percocet [oxycodone-acetaminophen]   Family History  Problem Relation Age of Onset  . Alcohol abuse Father   . Breast cancer Mother 11    Social History Social History   Tobacco Use  . Smoking status: Former Smoker    Types: Cigarettes  . Smokeless tobacco: Never Used  . Tobacco comment: quit 30 years ago  Vaping Use  . Vaping Use: Never used  Substance Use Topics  . Alcohol use: Yes    Alcohol/week: 12.0 standard drinks    Types: 12 Shots of liquor per week  . Drug use: No    Review of  Systems Level 5 Caveat: Portions of the History and Physical including HPI and review of systems are unable to be completely obtained due to patient being a poor historian   Constitutional:   No known fever.  ENT:   No rhinorrhea. Cardiovascular:   No chest pain or syncope. Respiratory:   No dyspnea or cough. Gastrointestinal:   Negative for abdominal pain, vomiting and diarrhea.  Musculoskeletal:   Negative for focal pain or swelling ____________________________________________   PHYSICAL EXAM:  VITAL SIGNS: ED Triage Vitals  Enc Vitals Group     BP 01/17/21 1521 (!) 154/103     Pulse Rate 01/17/21 1521 90     Resp 01/17/21 1521 13     Temp 01/17/21 1521 98 F (36.7 C)     Temp src --      SpO2 01/17/21 1521 99 %     Weight 01/17/21 1517 185 lb 3 oz (84 kg)     Height 01/17/21 1517 6' (1.829 m)  Head Circumference --      Peak Flow --      Pain Score 01/17/21 1516 8     Pain Loc --      Pain Edu? --      Excl. in [place]? --     Vital signs reviewed, nursing assessments reviewed.   Constitutional:   Alert and oriented. Non-toxic appearance. Eyes:   Conjunctivae are normal. EOMI. PERRL. ENT      Head:   Normocephalic and atraumatic.      Nose:   No congestion/rhinnorhea.       Mouth/Throat:   MMM, no pharyngeal erythema. No peritonsillar mass.       Neck:   No meningismus. Full ROM. Hematological/Lymphatic/Immunilogical:   No cervical lymphadenopathy. Cardiovascular:   RRR. Symmetric bilateral radial and DP pulses.  No murmurs. Cap refill less than 2 seconds. Respiratory:   Normal respiratory effort without tachypnea/retractions. Breath sounds are clear and equal bilaterally. No wheezes/rales/rhonchi. Gastrointestinal:   Soft and nontender. Non distended. There is no CVA tenderness.  No rebound, rigidity, or guarding.  Musculoskeletal:   Normal range of motion in all extremities. No joint effusions.  No lower extremity tenderness.  No edema. Neurologic:   Slurred  speech.  Normal language Motor grossly intact. No acute focal neurologic deficits are appreciated.  Skin:    Skin is warm, dry and intact. No rash noted.  No petechiae, purpura, or bullae.  ____________________________________________    LABS (pertinent positives/negatives) (all labs ordered are listed, but only abnormal results are displayed) Labs Reviewed  COMPREHENSIVE METABOLIC PANEL - Abnormal; Notable for the following components:      Result Value   Potassium 5.4 (*)    Glucose, Bld 110 (*)    All other components within normal limits  ETHANOL - Abnormal; Notable for the following components:   Alcohol, Ethyl (B) 312 (*)    All other components within normal limits  CBC WITH DIFFERENTIAL/PLATELET - Abnormal; Notable for the following components:   Hemoglobin 12.8 (*)    RDW 18.6 (*)    All other components within normal limits  RESP PANEL BY RT-PCR (FLU A&B, COVID) ARPGX2  LIPASE, BLOOD   ____________________________________________   EKG    ____________________________________________    RADIOLOGY  CT Head Wo Contrast  Result Date: 01/17/2021 CLINICAL DATA:  [place], loss of consciousness EXAM: CT HEAD WITHOUT CONTRAST TECHNIQUE: Contiguous axial images were obtained from the base of the skull through the vertex without intravenous contrast. COMPARISON:  12/18/2020 FINDINGS: Brain: No acute infarct or hemorrhage. Lateral ventricles and midline structures are unremarkable. No acute extra-axial fluid collections. No mass effect. Vascular: No hyperdense vessel or unexpected calcification. Skull: Normal. Negative for fracture or focal lesion. Sinuses/Orbits: Mild mucosal thickening left ethmoid air cells and visualized portion of the left maxillary sinus. Remaining sinuses are clear. Other: None. IMPRESSION: 1. No acute intracranial process. Electronically Signed   By: Randa Ngo M.D.   On: 01/17/2021 16:18   CT Cervical Spine Wo Contrast  Result Date:  01/17/2021 CLINICAL DATA:  Intoxicated, fell, loss of consciousness EXAM: CT CERVICAL SPINE WITHOUT CONTRAST TECHNIQUE: Multidetector CT imaging of the cervical spine was performed without intravenous contrast. Multiplanar CT image reconstructions were also generated. COMPARISON:  12/18/2020 FINDINGS: Alignment: Alignment is anatomic. Skull base and vertebrae: No acute fracture. No primary bone lesion or focal pathologic process. Soft tissues and spinal canal: No prevertebral fluid or swelling. No visible canal hematoma. Disc levels: Mild spondylosis at C3-4, C5-6, and  C6-7. No significant neural foraminal or central canal stenosis. Upper chest: Airway is patent.  Lung apices are clear. Other: Reconstructed images demonstrate no additional findings. IMPRESSION: 1. No acute cervical spine fracture. Electronically Signed   By: Randa Ngo M.D.   On: 01/17/2021 16:21   DG Chest Portable 1 View  Result Date: 01/17/2021 CLINICAL DATA:  Cough and wheezing.  Fall today EXAM: PORTABLE CHEST 1 VIEW COMPARISON:  01/15/2021 FINDINGS: Hypoventilation. Decreased lung volume with bibasilar atelectasis which has increased since the prior study. Negative for heart failure or pneumonia. No effusion. IMPRESSION: Hypoventilation with bibasilar atelectasis. Electronically Signed   By: Franchot Gallo M.D.   On: 01/17/2021 15:44    ____________________________________________   PROCEDURES Procedures  ____________________________________________    CLINICAL IMPRESSION / ASSESSMENT AND PLAN / ED COURSE  Medications ordered in the ED: Medications  sodium chloride 0.9 % bolus 1,000 mL (0 mLs Intravenous Stopped 01/17/21 1839)  ondansetron (ZOFRAN) injection 4 mg (4 mg Intravenous Given 01/17/21 1751)  ibuprofen (ADVIL) tablet 600 mg (600 mg Oral Given 01/17/21 1751)    Pertinent labs & imaging results that were available during my care of the patient were reviewed by me and considered in my medical decision making (see  chart for details).   Miguel [name] was evaluated in Emergency Department on 01/17/2021 for the symptoms described in the history of present illness. He was evaluated in the context of the global COVID-19 pandemic, which necessitated consideration that the patient might be at risk for infection with the SARS-CoV-2 virus that causes COVID-19. Institutional protocols and algorithms that pertain to the evaluation of patients at risk for COVID-19 are in a state of rapid change based on information released by regulatory bodies including the CDC and federal and state organizations. These policies and algorithms were followed during the patient's care in the ED.   Patient presents with alcohol intoxication.  Patient is well-known to this emergency department, multiple previous evaluations related to alcohol intoxication.  Will check labs to look for signs of dehydration, electrolyte abnormality, pancreatitis, liver injury.  Vital signs are normal, exam overall reassuring.  ----------------------------------------- 7:14 PM on 01/17/2021 -----------------------------------------  Lab panel unremarkable.  Ethanol level was 300 at 3:25 PM.  In the past patient has been clinically sober with ethanol levels in the 200s.  Recently he became agitated and impatient, demanded to leave.  Speech is clear, ambulatory with steady gait, tolerating oral intake, not suicidal or in any other way in immediate danger to himself or others.  Has medical decision-making capacity.  He eloped from the emergency department.      ____________________________________________   FINAL CLINICAL IMPRESSION(S) / ED DIAGNOSES    Final diagnoses:  Alcoholic intoxication without complication [facility])     ED Discharge Orders    None      Portions of this note were generated with dragon dictation software. Dictation errors may occur despite best attempts at proofreading.   Carrie Mew, MD 01/17/21 [phone]

## 2021-01-17 NOTE — ED Notes (Signed)
Patient ambulated off unit without difficulty. States "Just send me my papers." MD notified.

## 2021-01-18 ENCOUNTER — Other Ambulatory Visit: Payer: Self-pay

## 2021-01-18 ENCOUNTER — Emergency Department
Admission: EM | Admit: 2021-01-18 | Discharge: 2021-01-18 | Disposition: A | Discharge status: Home / Self Care | Payer: Medicaid Other | Attending: Emergency Medicine | Admitting: Emergency Medicine

## 2021-01-18 DIAGNOSIS — I10 Essential (primary) hypertension: Secondary | ICD-10-CM | POA: Insufficient documentation

## 2021-01-18 DIAGNOSIS — Z87891 Personal history of nicotine dependence: Secondary | ICD-10-CM | POA: Insufficient documentation

## 2021-01-18 DIAGNOSIS — Z79899 Other long term (current) drug therapy: Secondary | ICD-10-CM | POA: Insufficient documentation

## 2021-01-18 DIAGNOSIS — F1010 Alcohol abuse, uncomplicated: Secondary | ICD-10-CM | POA: Insufficient documentation

## 2021-01-18 LAB — CBC WITH DIFFERENTIAL/PLATELET
Abs Immature Granulocytes: 0.03 10*3/uL (ref 0.00–0.07)
Basophils Absolute: 0.1 10*3/uL (ref 0.0–0.1)
Basophils Relative: 1 %
Eosinophils Absolute: 0.2 10*3/uL (ref 0.0–0.5)
Eosinophils Relative: 3 %
HCT: 41.6 % (ref 39.0–52.0)
Hemoglobin: 13.6 g/dL (ref 13.0–17.0)
Immature Granulocytes: 1 %
Lymphocytes Relative: 40 %
Lymphs Abs: 2.6 10*3/uL (ref 0.7–4.0)
MCH: 28.2 pg (ref 26.0–34.0)
MCHC: 32.7 g/dL (ref 30.0–36.0)
MCV: 86.3 fL (ref 80.0–100.0)
Monocytes Absolute: 0.5 10*3/uL (ref 0.1–1.0)
Monocytes Relative: 8 %
Neutro Abs: 3.1 10*3/uL (ref 1.7–7.7)
Neutrophils Relative %: 47 %
Platelets: 195 10*3/uL (ref 150–400)
RBC: 4.82 MIL/uL (ref 4.22–5.81)
RDW: 18.9 % — ABNORMAL HIGH (ref 11.5–15.5)
WBC: 6.4 10*3/uL (ref 4.0–10.5)
nRBC: 0 % (ref 0.0–0.2)

## 2021-01-18 LAB — BASIC METABOLIC PANEL
Anion gap: 12 (ref 5–15)
BUN: 20 mg/dL (ref 8–23)
CO2: 21 mmol/L — ABNORMAL LOW (ref 22–32)
Calcium: 9 mg/dL (ref 8.9–10.3)
Chloride: 108 mmol/L (ref 98–111)
Creatinine, Ser: 1.21 mg/dL (ref 0.61–1.24)
GFR, Estimated: >60 mL/min (ref >60)
Glucose, Bld: 98 mg/dL (ref 70–99)
Potassium: 4.7 mmol/L (ref 3.5–5.1)
Sodium: 141 mmol/L (ref 135–145)

## 2021-01-18 LAB — ETHANOL: Alcohol, Ethyl (B): 411 mg/dL (ref <10)

## 2021-01-18 LAB — MAGNESIUM: Magnesium: 2.3 mg/dL (ref 1.7–2.4)

## 2021-01-18 MED ORDER — THIAMINE HCL 100 MG/ML IJ SOLN: 100.0000 mg | Freq: Every day | INTRAMUSCULAR | Status: DC

## 2021-01-18 MED ORDER — LORAZEPAM 2 MG PO TABS: 0.0000 mg | ORAL_TABLET | Freq: Four times a day (QID) | ORAL | Status: DC

## 2021-01-18 MED ORDER — DROPERIDOL 2.5 MG/ML IJ SOLN
5.0000 mg | Freq: Once | INTRAMUSCULAR | Status: AC
  Administered 2021-01-18: 5 mg via INTRAVENOUS
  Filled 2021-01-18: qty 2

## 2021-01-18 MED ORDER — CHLORDIAZEPOXIDE HCL 5 MG PO CAPS: ORAL_CAPSULE | ORAL | 0 refills | Status: DC

## 2021-01-18 MED ORDER — THIAMINE HCL 100 MG PO TABS: 100.0000 mg | ORAL_TABLET | Freq: Every day | ORAL | Status: DC

## 2021-01-18 MED ORDER — LORAZEPAM 2 MG/ML IJ SOLN: 0.0000 mg | Freq: Two times a day (BID) | INTRAMUSCULAR | Status: DC

## 2021-01-18 MED ORDER — LORAZEPAM 2 MG PO TABS: 0.0000 mg | ORAL_TABLET | Freq: Two times a day (BID) | ORAL | Status: DC

## 2021-01-18 MED ORDER — LORAZEPAM 2 MG/ML IJ SOLN: 0.0000 mg | Freq: Four times a day (QID) | INTRAMUSCULAR | Status: DC

## 2021-01-18 NOTE — ED Provider Notes (Signed)
[facility] Emergency Department Provider Note  ____________________________________________   Event Date/Time   First MD Initiated Contact with Patient 01/18/21 [phone]     (approximate)  I have reviewed the triage vital signs and the nursing notes.   HISTORY  Chief Complaint Alcohol Intoxication  Level 5 caveat:  history/ROS limited by acute intoxication  HPI Miguel [name] is a 60 y.o. male well-known to the emergency department for frequent visits due to alcohol intoxication as well as occasional other issues.  He presents by EMS for nonspecific reasons (he never clarified) only a few hours after he eloped from the emergency department for his last visit due to a fall while intoxicated.  He was seen and cleared last time by Dr. Joni Fears and found to be clinically sober ("normal" for this patient is usually a ethanol level in the 200s, where he is functional and ambulatory without difficulty).  Unfortunately it seems that he went home and finish off on the line and then called EMS again.  He is tearful and upset about his drinking.  He denies having any pain at this time.  He is not short of breath.  He said that he is home all day with nothing to do so he drinks alcohol.  He has stopped for periods of time in the past but started drinking again a while ago and has been unable to slow down or stop.  He has no desire to harm himself or anyone else.         Past Medical History:  Diagnosis Date  . Alcohol abuse   . Anxiety   . Asthma   . Family history of breast cancer   . GERD (gastroesophageal reflux disease)   . Gout   . Hx of colonic polyps   . Hypertension   . Kidney stone   . 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 OCD (obsessive compulsive disorder)   . Renal colic     Patient Active Problem List   Diagnosis Date Noted  . Hypotension 11/28/2020  . Genetic testing 07/15/2020  . 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 Right ankle pain 06/26/2020  . Family history of breast cancer   . Hx of colonic polyps   . Hospital discharge follow-up 06/05/2020  . Kidney function test abnormal 06/05/2020  . Abdominal pain 04/12/2020  . Hypertensive urgency 04/12/2020  . Alcohol withdrawal ([place]) 03/04/2020  . Right-sided chest wall pain 02/28/2020  . Alcohol abuse with alcohol-induced mood disorder ([place]) 12/02/2019  . Moderate episode of recurrent major depressive disorder ([place])   . Health care maintenance 10/26/2019  . Right knee pain 10/26/2019  . Generalized anxiety disorder 10/14/2019  . MDD (major depressive disorder), recurrent severe, without psychosis ([place]) 07/27/2019  . Social anxiety disorder 07/27/2019  . Left-sided chest wall pain 06/14/2019  . Alcoholic cirrhosis of liver without ascites ([place]) 06/14/2019  . Alcohol abuse 06/14/2019  . AKI (acute kidney injury) ([place]) 05/27/2019  . Alcohol withdrawal syndrome without complication ([place]) 65/99/3570  . Alcoholic intoxication without complication ([place])   . Sepsis ([place]) 03/08/2019  . Breast lump or mass 10/04/2018  . Sepsis secondary to UTI ([place]) 09/14/2018  . Asthma 07/07/2018  . GERD (gastroesophageal reflux disease) 07/07/2018  . OCD (obsessive compulsive disorder) 07/07/2018  . Self-inflicted laceration of left wrist ([place]) 03/10/2018  . Leg hematoma 12/25/2017  . Suicide and self-inflicted injury by cutting and piercing instrument ([place]) 03/10/2017  .  Severe recurrent major 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 ([place]) 03/09/2017  . Substance induced mood disorder ([place]) 08/15/2016  . Involuntary commitment 08/15/2016  . Alcohol use disorder, severe, dependence ([place]) 02/05/2016  . Hypertension 12/05/2015  . Tachycardia 12/05/2015  . Gout 11/13/2015  . Chronic back pain 05/02/2015    Past Surgical History:  Procedure Laterality Date  . CHOLECYSTECTOMY  2012  . COLONOSCOPY    . COLONOSCOPY WITH PROPOFOL N/A 05/28/2020   Procedure: COLONOSCOPY  WITH PROPOFOL;  Surgeon: [name], MD;  Location: [facility]COPY;  Service: Gastroenterology;  Laterality: N/A;  . EXTRACORPOREAL SHOCK WAVE LITHOTRIPSY Left 01/12/2019   Procedure: EXTRACORPOREAL SHOCK WAVE LITHOTRIPSY (ESWL);  Surgeon: Billey Co, MD;  Location: [facility] ORS;  Service: Urology;  Laterality: Left;  . UPPER GI ENDOSCOPY      Prior to Admission medications   Medication Sig Start Date End Date Taking? Authorizing Provider  chlordiazePOXIDE (LIBRIUM) 5 MG capsule Take 6 caps (30 mg) 4x daily on day 1. Take 5 caps (25 mg) 4x daily on day 2. Take 4 caps (20 mg) 4x daily on day 3. Take 3 caps (15 mg) 4x daily on day 4. Take 2 caps (10 mg) 4x daily on day 5. Take 2 caps (10 mg) 3x daily on day 6. Take 1 cap (5 mg) 3x daily on day 7. Take 1 cap (5 mg) twice daily on day 8. Take 1 cap (5 mg) in the evening on day 9. 01/18/21  Yes Hinda Kehr, MD  allopurinol (ZYLOPRIM) 300 MG tablet TAKE ONE TABLET BY MOUTH EVERY DAY 09/18/20   Iloabachie, Chioma E, NP  ascorbic acid (VITAMIN C) 500 MG tablet Take by mouth. 08/01/19   [provider]  citalopram (CELEXA) 20 MG tablet Take 1 tablet (20 mg total) by mouth daily. 03/21/20 03/21/21  Clapacs, Madie Reno, MD  fexofenadine-pseudoephedrine (ALLEGRA-D) 60-120 MG 12 hr tablet Take 1 tablet by mouth 2 (two) times daily. 12/31/20   Sable Feil, PA-C  Fluticasone-Salmeterol (ADVAIR DISKUS) 100-50 MCG/DOSE AEPB Inhale 1 puff into the lungs 2 (two) times daily. 03/21/20   Clapacs, Madie Reno, MD  folic acid (FOLVITE) 1 MG tablet TAKE ONE TABLET BY MOUTH EVERY DAY 01/07/21   Iloabachie, Chioma E, NP  lisinopril (ZESTRIL) 40 MG tablet TAKE ONE TABLET BY MOUTH EVERY DAY 09/18/20   Iloabachie, Chioma E, NP  metoprolol tartrate (LOPRESSOR) 25 MG tablet Take 1 tablet (25 mg total) by mouth daily. 11/30/20 12/30/20  Wyvonnia Dusky, MD  pantoprazole (PROTONIX) 40 MG tablet Take 1 tablet (40 mg total) by mouth daily. 10/15/20   Iloabachie, Chioma E, NP   sulfamethoxazole-trimethoprim (BACTRIM DS) 800-160 MG tablet Take 1 tablet by mouth 2 (two) times daily. 12/31/20   Sable Feil, PA-C  thiamine 100 MG tablet Take 1 tablet (100 mg total) by mouth daily. 10/22/20   Iloabachie, Chioma E, NP  VENTOLIN HFA 108 (90 Base) MCG/ACT inhaler INHALE 2 PUFFS EVERY 4 HOURS AS NEEDED 10/22/20   Iloabachie, Chioma E, NP    Allergies Percocet [oxycodone-acetaminophen]  Family History  Problem Relation Age of Onset  . Alcohol abuse Father   . Breast cancer Mother 71    Social History Social History   Tobacco Use  . Smoking status: Former Smoker    Types: Cigarettes  . Smokeless tobacco: Never Used  . Tobacco comment: quit 30 years ago  Vaping Use  . Vaping Use: Never used  Substance Use Topics  . Alcohol use: Yes  Alcohol/week: 12.0 standard drinks    Types: 12 Shots of liquor per week  . Drug use: No    Review of Systems Level 5 caveat:  history/ROS limited by acute intoxication   ____________________________________________   PHYSICAL EXAM:  VITAL SIGNS: ED Triage Vitals  Enc Vitals Group     BP 01/18/21 0410 (!) 156/85     Pulse Rate 01/18/21 0410 96     Resp 01/18/21 0410 16     Temp 01/18/21 0417 98.1 F (36.7 C)     Temp Source 01/18/21 0417 Oral     SpO2 01/18/21 0410 100 %     Weight 01/18/21 0411 84 kg (185 lb 3 oz)     Height 01/18/21 0411 1.829 m (6')     Head Circumference --      Peak Flow --      Pain Score 01/18/21 0411 0     Pain Loc --      Pain Edu? --      Excl. in [place]? --     Constitutional: Alert and oriented.  Appears intoxicated. Eyes: Conjunctivae are normal.  Head: Atraumatic. Nose: No congestion/rhinnorhea. Mouth/Throat: Patient is wearing a mask. Neck: No stridor.  No meningeal signs.   Cardiovascular: Normal rate, regular rhythm. Good peripheral circulation. Respiratory: Normal respiratory effort.  No retractions. Gastrointestinal: Soft and nontender. No distention.  Musculoskeletal:  No lower extremity tenderness nor edema. No gross deformities of extremities. Neurologic: Slurred speech and language. No gross focal neurologic deficits are appreciated.  Skin:  Skin is warm, dry and intact. Psychiatric: Mood and affect are labile and tearful but denies suicidal ideation.  ____________________________________________   LABS (all labs ordered are listed, but only abnormal results are displayed)  Labs Reviewed  BASIC METABOLIC PANEL - Abnormal; Notable for the following components:      Result Value   CO2 21 (*)    All other components within normal limits  CBC WITH DIFFERENTIAL/PLATELET - Abnormal; Notable for the following components:   RDW 18.9 (*)    All other components within normal limits  ETHANOL - Abnormal; Notable for the following components:   Alcohol, Ethyl (B) 411 (*)    All other components within normal limits  MAGNESIUM   ____________________________________________  EKG  No indication for emergent EKG.  I reviewed EKGs from the past and he does not have QT prolongation. ____________________________________________  RADIOLOGY No indication for emergent imaging on this visit   ____________________________________________   PROCEDURES   Procedure(s) performed (including Critical Care):  .1-3 Lead EKG Interpretation Performed by: Hinda Kehr, MD Authorized by: Hinda Kehr, MD     Interpretation: normal     ECG rate:  90   ECG rate assessment: normal     Rhythm: sinus rhythm     Ectopy: none     Conduction: normal       ____________________________________________   INITIAL IMPRESSION / MDM / ASSESSMENT AND PLAN / ED COURSE  As part of my medical decision making, I reviewed the following data within the [place] notes reviewed and incorporated, Labs reviewed , Old chart reviewed and Notes from prior ED visits   Differential diagnosis includes, but is not limited to, alcohol intoxication with  behavioral disturbance, substance-induced mood disorder, acute intracranial injury, other nonspecific metabolic or electrolyte abnormality.  The patient is on the cardiac monitor to evaluate for evidence of arrhythmia and/or significant heart rate changes.  I had a long talk with Miguel [name]  about his chronic issues.  I tried to point out that only he can choose to make a difference in his life and that we cannot do it for him.  He has done well in the past with decreasing the amount of alcohol he consumes and even stopping for an extended period of time.  He does not meet criteria for involuntary commitment nor inpatient treatment but he clearly is not capable of being discharged at the moment.  He agrees to relax and try to get some sleep.  However he is agitated with labile emotions, crying and occasionally yelling.  I am ordering droperidol 5 mg IV as a calming agent; I reviewed prior EKGs to verify he has no history of QT prolongation and I believe this will be a safe and better alternative to giving him more benzodiazepines which will prolong his intoxication.  I ordered thiamine 100 mg by mouth and CIWA protocol.  I anticipate he will be discharged in the morning and I will consider prescribing a Librium taper because he is asking for help but does not meet criteria for inpatient treatment.     Clinical Course as of 01/18/21 0715  Sat Jan 18, 2021  0500 Normal basic metabolic panel, magnesium, and CBC.  Ethanol level is still pending. [CF]  0520 Alcohol, Ethyl (B)(!!): 411 [CF]  [id number] Patient is awake and alert but still clearly clinically intoxicated.  He is unsteady on his feet.  He is asking when he can go but it was explained to him that it is not yet time and that he needs to sober up more. [CF]  8592 Transferring ED care to Dr. Corky Downs to reassess once patient is clinically sober. [CF]    Clinical Course User Index [CF] Hinda Kehr, MD      ____________________________________________  FINAL CLINICAL IMPRESSION(S) / ED DIAGNOSES  Final diagnoses:  Alcohol abuse     MEDICATIONS GIVEN DURING THIS VISIT:  Medications  LORazepam (ATIVAN) injection 0-4 mg (0 mg Intravenous Hold 01/18/21 0459)    Or  LORazepam (ATIVAN) tablet 0-4 mg ( Oral See Alternative 01/18/21 0459)  LORazepam (ATIVAN) injection 0-4 mg (has no administration in time range)    Or  LORazepam (ATIVAN) tablet 0-4 mg (has no administration in time range)  thiamine tablet 100 mg (has no administration in time range)    Or  thiamine (B-1) injection 100 mg (has no administration in time range)  droperidol (INAPSINE) 2.5 MG/ML injection 5 mg (5 mg Intravenous Given 01/18/21 0459)     ED Discharge Orders         Ordered    chlordiazePOXIDE (LIBRIUM) 5 MG capsule        01/18/21 9244          *Please note:  Miguel [name] was evaluated in Emergency Department on 01/18/2021 for the symptoms described in the history of present illness. He was evaluated in the context of the global COVID-19 pandemic, which necessitated consideration that the patient might be at risk for infection with the SARS-CoV-2 virus that causes COVID-19. Institutional protocols and algorithms that pertain to the evaluation of patients at risk for COVID-19 are in a state of rapid change based on information released by regulatory bodies including the CDC and federal and state organizations. These policies and algorithms were followed during the patient's care in the ED.  Some ED evaluations and interventions may be delayed as a result of limited staffing during and after the pandemic.*  Note:  This  document was prepared using Systems analyst and may include unintentional dictation errors.   Hinda Kehr, MD 01/18/21 [phone]

## 2021-01-18 NOTE — ED Triage Notes (Signed)
Pt arrives EMS for c/o dizziness/feeling faint. Pt was seen here earlier today for ETOH and went home AMA and drank more.

## 2021-01-18 NOTE — ED Notes (Signed)
Pt woke up and sitting on end of bed stating that he is feeling better and expressed wanting to go home. Informed EDP. Speech clear, moving all extremities on own, no shaking or vomiting noted. Will remove IV at this time.

## 2021-01-18 NOTE — Discharge Instructions (Signed)
You were seen in the emergency department for alcohol intoxication.  Please seek help from the recommended resources for assistance with your alcohol dependence.  If you have any thoughts of hurting herself or others, please call 911 or return to the emergency department.  Please avoid drug and alcohol use.  Never drive a vehicle or operate machinery while intoxicated.

## 2021-01-20 IMAGING — CT CT ABD-PELV W/O CM
2 of 4 series · 16 of 46 positions shown, 18 images · non-contrast
Comparison: 04/12/2020

CLINICAL DATA: Right upper quadrant abdominal pain. Nausea and
vomiting over the past 8 days.

EXAM:
CT ABDOMEN AND PELVIS WITHOUT CONTRAST
TECHNIQUE: Multidetector CT imaging of the abdomen and pelvis was performed
following the standard protocol without IV contrast.

[Series 2: routine abd/pel wo · axial · 0.88mm/px · z∈[-507,-67]mm · 13 of 96 slices shown, 15 images]
[im 4/96  soft-tissue]
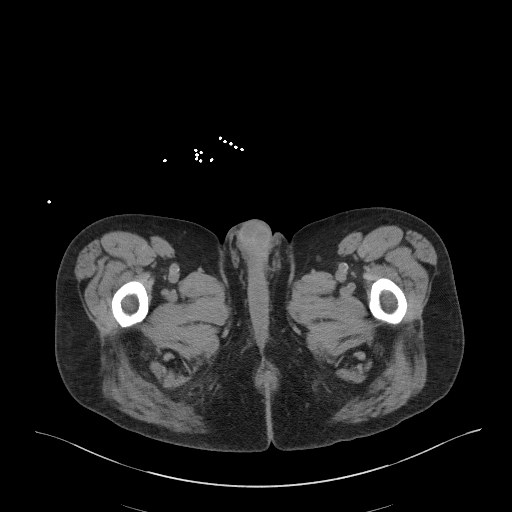
[im 4/96  bone]
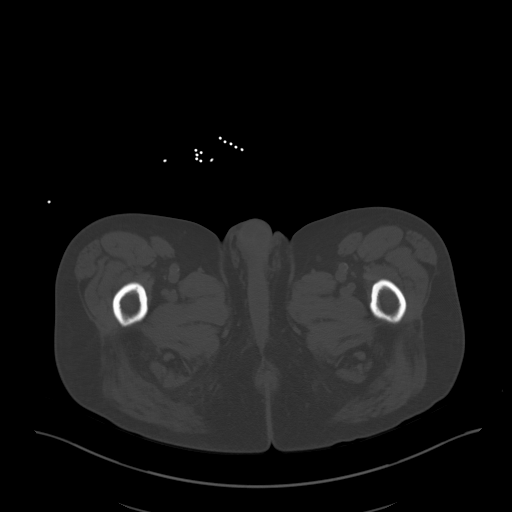
[im 12/96  soft-tissue]
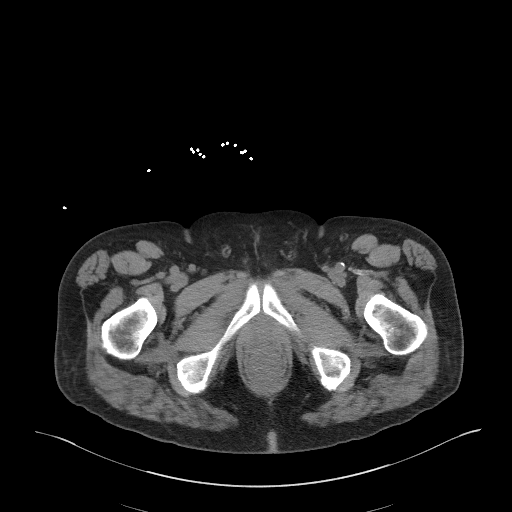
[im 20/96  soft-tissue]
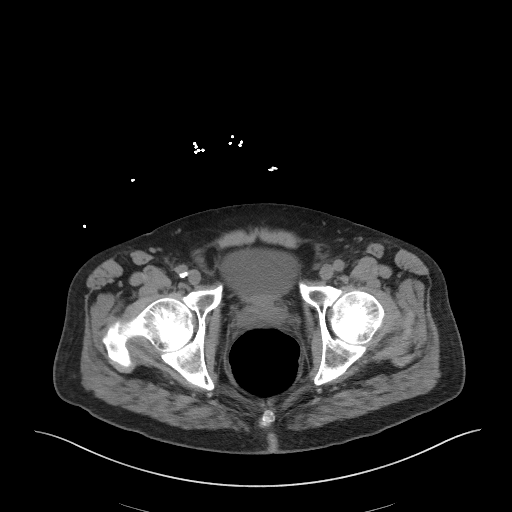
[im 27/96  soft-tissue]
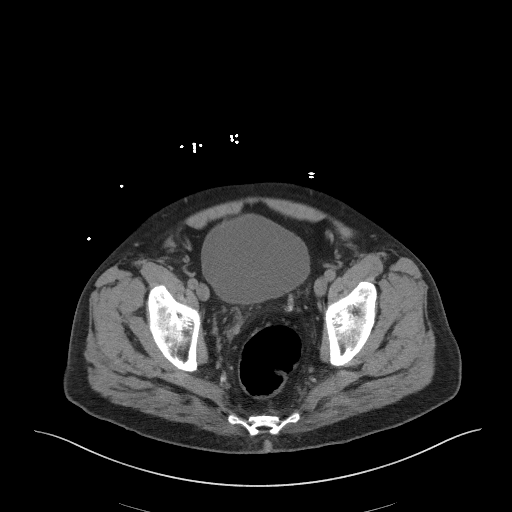
[im 35/96  soft-tissue]
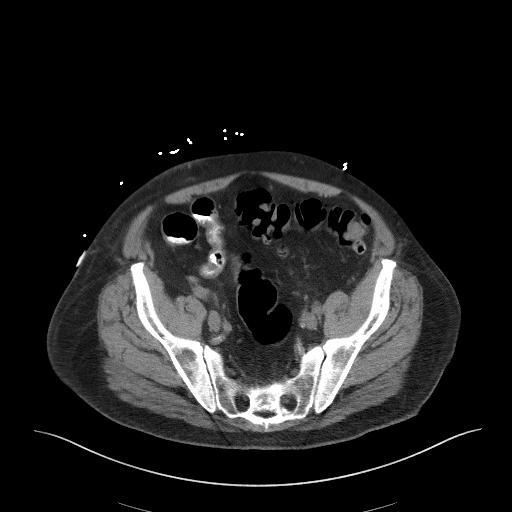
[im 42/96  soft-tissue]
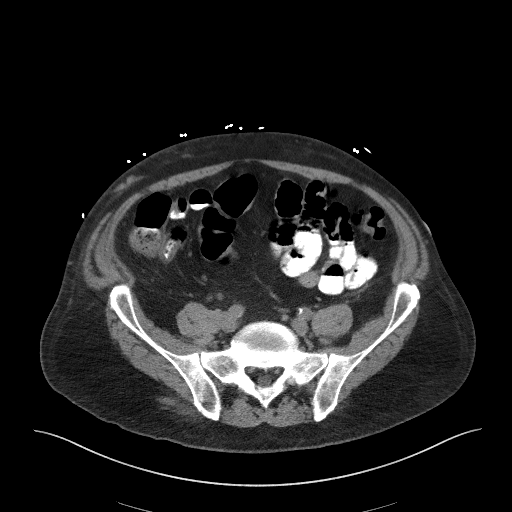
[im 50/96  soft-tissue]
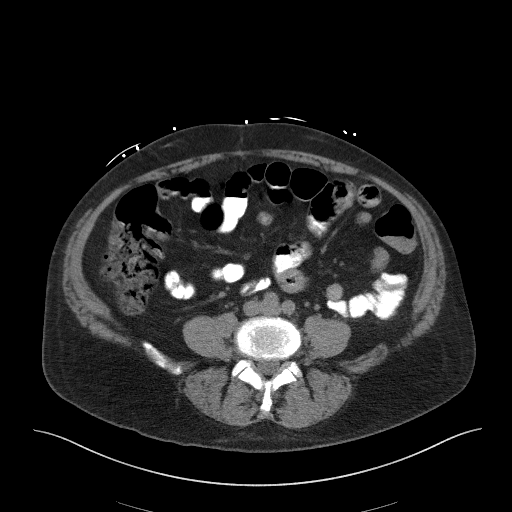
[im 54/96  soft-tissue]
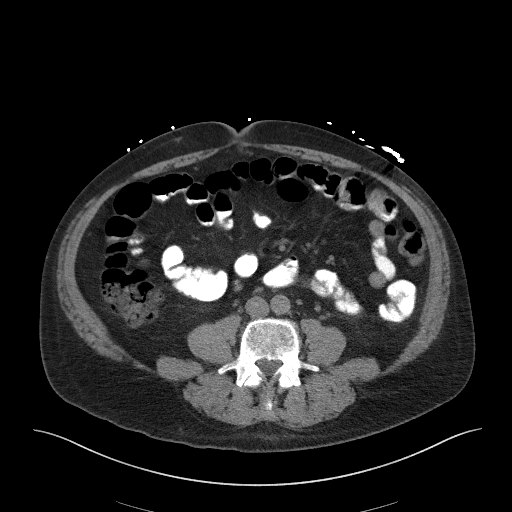
[im 61/96  soft-tissue]
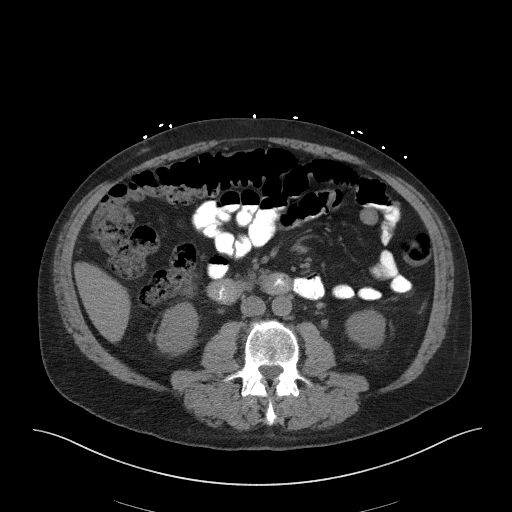
[im 61/96  bone]
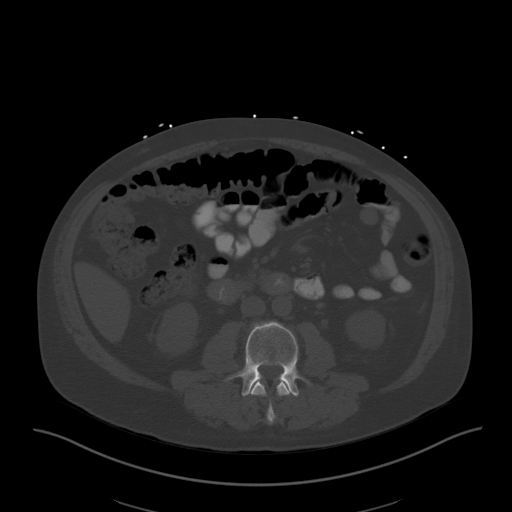
[im 69/96  soft-tissue]
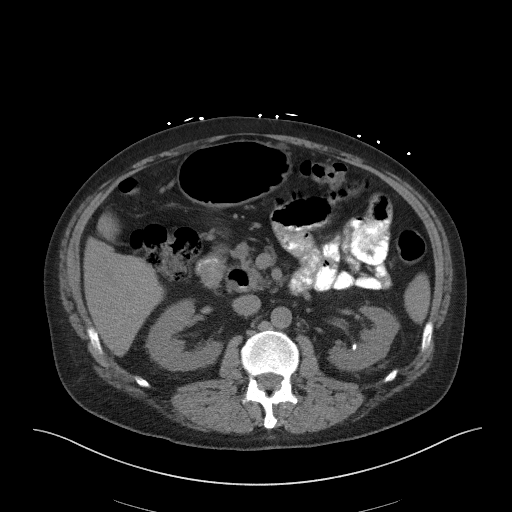
[im 77/96  soft-tissue]
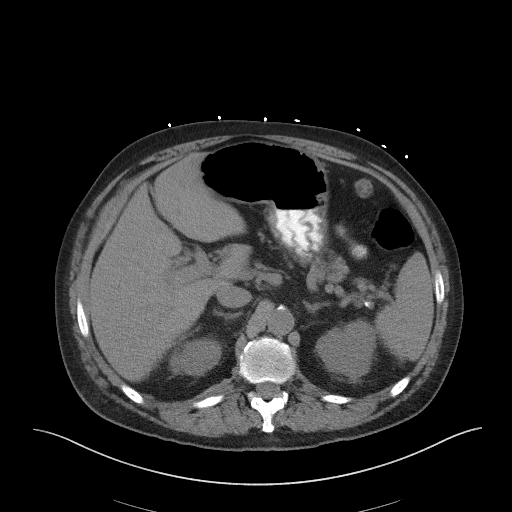
[im 84/96  soft-tissue]
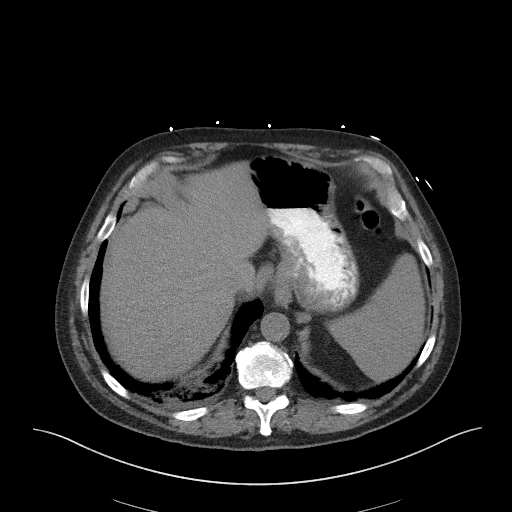
[im 92/96  soft-tissue]
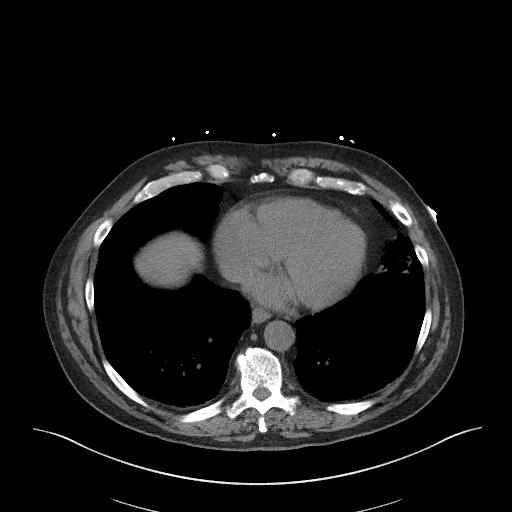

[Series 5: coronal st · coronal · 0.77mm/px · 3 of 97 slices shown]
[im 33/97  soft-tissue]
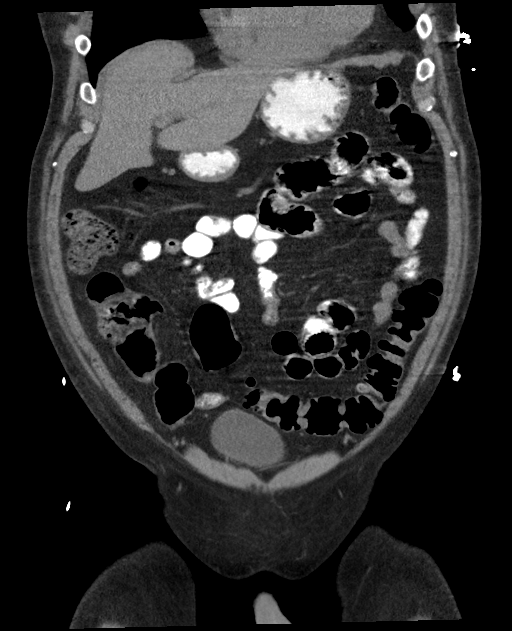
[im 43/97  soft-tissue]
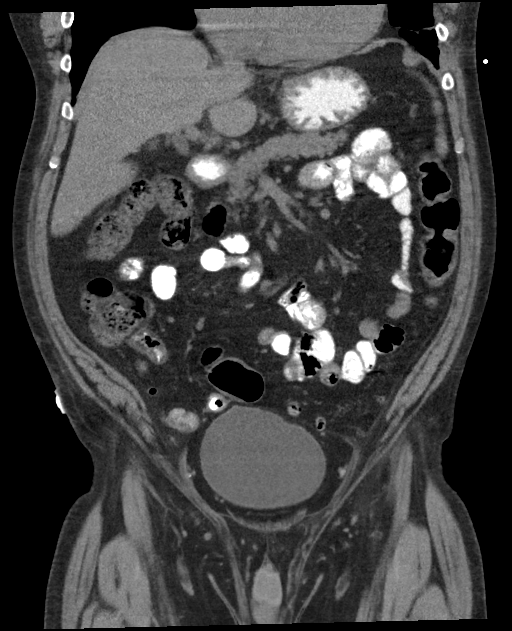
[im 54/97  soft-tissue]
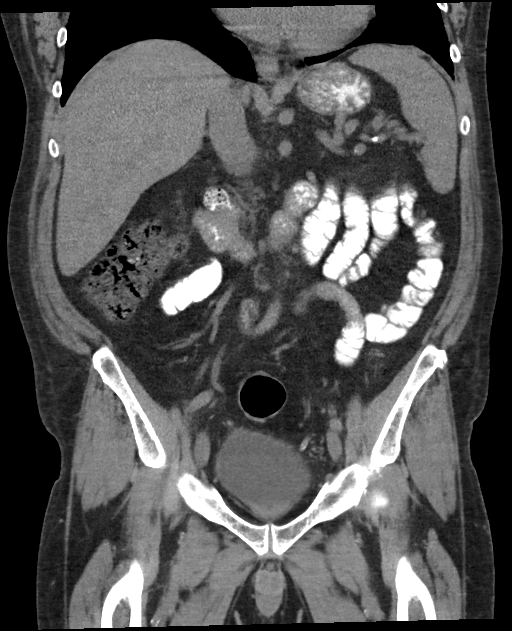

[16 of 46 positions shown; findings below may reference images not displayed]

FINDINGS: Lower chest: Dependent bandlike airspace opacities in the posterior
basal segments of the lower lobes, right greater left, favoring
atelectasis. Mild scarring in the lingula. Descending thoracic
aortic atherosclerotic calcification.

Hepatobiliary: Surgically absent gallbladder. Unremarkable
noncontrast CT appearance of the liver.

Pancreas: Unremarkable

Spleen: Unremarkable

Adrenals/Urinary Tract: Both adrenal glands appear normal.

There is 6 nonobstructive right renal calculi, the largest measuring
0.4 cm in diameter, image 37/5.

There are 3 nonobstructive left renal calculi, the largest measuring
0.4 cm in diameter.

Stomach/Bowel: Orally administered contrast makes its way down to
the cecum. There are 2 moderate-sized periampullary duodenal
diverticula, without findings of active inflammation. Normal
appendix. Sigmoid colon diverticulosis. No abnormal bowel caliber.
Normal appearance of jejunal folds.

Vascular/Lymphatic: Mild aortoiliac atherosclerotic vascular
calcification. No pathologic adenopathy.

Reproductive: Dystrophic calcifications centrally in the prostate
gland.

Other: No supplemental non-categorized findings.

Musculoskeletal: Healing of a left upper sixth rib fracture observed
on the top most image, probably about 2 months old.
IMPRESSION: 1. A cause for the patient's right upper quadrant pain and nausea
and vomiting is not identified.
2. Subacute healing left sixth rib fracture anteriorly, probably
about 2 months old.
3. Bilateral nonobstructive renal calculi.
4. Subsegmental atelectasis in the posterior basal segments of both
lower lobes.
5.  Aortic Atherosclerosis ([id number]).

## 2021-01-24 ENCOUNTER — Other Ambulatory Visit: Payer: Self-pay

## 2021-01-24 ENCOUNTER — Emergency Department: Payer: Self-pay

## 2021-01-24 ENCOUNTER — Emergency Department
Admission: EM | Admit: 2021-01-24 | Discharge: 2021-01-25 | Disposition: A | Discharge status: Home / Self Care | Payer: Self-pay | Attending: Emergency Medicine | Admitting: Emergency Medicine

## 2021-01-24 DIAGNOSIS — Z7951 Long term (current) use of inhaled steroids: Secondary | ICD-10-CM | POA: Insufficient documentation

## 2021-01-24 DIAGNOSIS — F1310 Sedative, hypnotic or anxiolytic abuse, uncomplicated: Secondary | ICD-10-CM | POA: Insufficient documentation

## 2021-01-24 DIAGNOSIS — M542 Cervicalgia: Secondary | ICD-10-CM | POA: Insufficient documentation

## 2021-01-24 DIAGNOSIS — Z79899 Other long term (current) drug therapy: Secondary | ICD-10-CM | POA: Insufficient documentation

## 2021-01-24 DIAGNOSIS — F1010 Alcohol abuse, uncomplicated: Secondary | ICD-10-CM | POA: Insufficient documentation

## 2021-01-24 DIAGNOSIS — J45909 Unspecified asthma, uncomplicated: Secondary | ICD-10-CM | POA: Insufficient documentation

## 2021-01-24 DIAGNOSIS — I10 Essential (primary) hypertension: Secondary | ICD-10-CM | POA: Insufficient documentation

## 2021-01-24 DIAGNOSIS — R519 Headache, unspecified: Secondary | ICD-10-CM | POA: Insufficient documentation

## 2021-01-24 DIAGNOSIS — W19XXXA Unspecified fall, initial encounter: Secondary | ICD-10-CM

## 2021-01-24 DIAGNOSIS — W228XXA Striking against or struck by other objects, initial encounter: Secondary | ICD-10-CM | POA: Insufficient documentation

## 2021-01-24 DIAGNOSIS — Z87891 Personal history of nicotine dependence: Secondary | ICD-10-CM | POA: Insufficient documentation

## 2021-01-24 LAB — CBG MONITORING, ED: Glucose-Capillary: 101 mg/dL — ABNORMAL HIGH (ref 70–99)

## 2021-01-24 NOTE — ED Notes (Signed)
Pt reports fall yesterday and now reports neck pain and knee pain. Pt reports beginning to take librium 3 days ago and reports having a "couple sips" of vodka tonight. Pt worried about having vodka with medication as he is aware he is not supposed to drink with the medication.

## 2021-01-24 NOTE — ED Triage Notes (Signed)
First Nurse: As per [facility], pt was prescribed librium for ETOH withdrawal. Pt took 6 caps for the librium today and took it with alcohol today. EMS states pt is nervous of possible side effects of taking both alcohol and librium together. Pt did have a fall, and denies injuries from the fall. PT told EMS he took the amount of meds he was supposed too.

## 2021-01-24 NOTE — ED Provider Notes (Signed)
[facility] Emergency Department Provider Note   ____________________________________________   Event Date/Time   First MD Initiated Contact with Patient 01/24/21 2156     (approximate)  I have reviewed the triage vital signs and the nursing notes.   HISTORY  Chief Complaint Alcohol Intoxication and Neck Pain    HPI Miguel [name] is a 60 y.o. male with a history of multiple evaluations for alcohol abuse, as well as frequent falls and injuries  Patient presents today reports to me yesterday that he fell.  He reports he struck his head and his neck is been feeling sore in the right side of his head is been causing moderate headache since the fall yesterday.  Reports it was his wife's birthday party, and that he had been sober for about 6 days.  He began using alcohol once again, and reports that he had only small amount of alcohol to drink tonight but wanted to come to the hospital because he continues to have achiness and pain in his neck.  No numbness or weakness.  No trouble walking.  No chest pain or trouble breathing.  Reports he stumbled.  He also was prescribed Librium recently, he is been using a taper but reports that he did use a total of 6 Librium capsule test today for alcohol withdrawal but also drank some alcohol with that today as well.  He otherwise reports he is in normal health.  He prepared a birthday party for his wife yesterday, he had some guests attend.  He denies any recent illnesses or fevers.  He was feeling pretty well but now feels sad and a bit remorseful that he relapsed to drinking once again.  Denies desire to harm himself or anyone else  Past Medical History:  Diagnosis Date  . Alcohol abuse   . Anxiety   . Asthma   . Family history of breast cancer   . GERD (gastroesophageal reflux disease)   . Gout   . Hx of colonic polyps   . Hypertension   . Kidney stone   . 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 OCD  (obsessive compulsive disorder)   . Renal colic     Patient Active Problem List   Diagnosis Date Noted  . Hypotension 11/28/2020  . Genetic testing 07/15/2020  . 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 Right ankle pain 06/26/2020  . Family history of breast cancer   . Hx of colonic polyps   . Hospital discharge follow-up 06/05/2020  . Kidney function test abnormal 06/05/2020  . Abdominal pain 04/12/2020  . Hypertensive urgency 04/12/2020  . Alcohol withdrawal ([place]) 03/04/2020  . Right-sided chest wall pain 02/28/2020  . Alcohol abuse with alcohol-induced mood disorder ([place]) 12/02/2019  . Moderate episode of recurrent major depressive disorder ([place])   . Health care maintenance 10/26/2019  . Right knee pain 10/26/2019  . Generalized anxiety disorder 10/14/2019  . MDD (major depressive disorder), recurrent severe, without psychosis ([place]) 07/27/2019  . Social anxiety disorder 07/27/2019  . Left-sided chest wall pain 06/14/2019  . Alcoholic cirrhosis of liver without ascites ([place]) 06/14/2019  . Alcohol abuse 06/14/2019  . AKI (acute kidney injury) ([place]) 05/27/2019  . Alcohol withdrawal syndrome without complication ([place]) 66/44/0347  . Alcoholic intoxication without complication ([place])   . Sepsis ([place]) 03/08/2019  . Breast lump or mass 10/04/2018  . Sepsis secondary to UTI ([place]) 09/14/2018  . Asthma 07/07/2018  . GERD (  gastroesophageal reflux disease) 07/07/2018  . OCD (obsessive compulsive disorder) 07/07/2018  . Self-inflicted laceration of left wrist ([place]) 03/10/2018  . Leg hematoma 12/25/2017  . Suicide and self-inflicted injury by cutting and piercing instrument ([place]) 03/10/2017  . Severe recurrent major 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 ([place]) 03/09/2017  . Substance induced mood disorder ([place]) 08/15/2016  . Involuntary commitment 08/15/2016  . Alcohol use disorder, severe, dependence ([place]) 02/05/2016  . Hypertension 12/05/2015  . Tachycardia 12/05/2015   . Gout 11/13/2015  . Chronic back pain 05/02/2015    Past Surgical History:  Procedure Laterality Date  . CHOLECYSTECTOMY  2012  . COLONOSCOPY    . COLONOSCOPY WITH PROPOFOL N/A 05/28/2020   Procedure: COLONOSCOPY WITH PROPOFOL;  Surgeon: [name], MD;  Location: [facility] ENDOSCOPY;  Service: Gastroenterology;  Laterality: N/A;  . EXTRACORPOREAL SHOCK WAVE LITHOTRIPSY Left 01/12/2019   Procedure: EXTRACORPOREAL SHOCK WAVE LITHOTRIPSY (ESWL);  Surgeon: Billey Co, MD;  Location: [facility] ORS;  Service: Urology;  Laterality: Left;  . UPPER GI ENDOSCOPY      Prior to Admission medications   Medication Sig Start Date End Date Taking? Authorizing Provider  allopurinol (ZYLOPRIM) 300 MG tablet TAKE ONE TABLET BY MOUTH EVERY DAY 09/18/20   Iloabachie, Chioma E, NP  ascorbic acid (VITAMIN C) 500 MG tablet Take by mouth. 08/01/19   [provider]  chlordiazePOXIDE (LIBRIUM) 5 MG capsule Take 6 caps (30 mg) 4x daily on day 1. Take 5 caps (25 mg) 4x daily on day 2. Take 4 caps (20 mg) 4x daily on day 3. Take 3 caps (15 mg) 4x daily on day 4. Take 2 caps (10 mg) 4x daily on day 5. Take 2 caps (10 mg) 3x daily on day 6. Take 1 cap (5 mg) 3x daily on day 7. Take 1 cap (5 mg) twice daily on day 8. Take 1 cap (5 mg) in the evening on day 9. 01/18/21   Hinda Kehr, MD  citalopram (CELEXA) 20 MG tablet Take 1 tablet (20 mg total) by mouth daily. 03/21/20 03/21/21  Clapacs, Madie Reno, MD  fexofenadine-pseudoephedrine (ALLEGRA-D) 60-120 MG 12 hr tablet Take 1 tablet by mouth 2 (two) times daily. 12/31/20   Sable Feil, PA-C  Fluticasone-Salmeterol (ADVAIR DISKUS) 100-50 MCG/DOSE AEPB Inhale 1 puff into the lungs 2 (two) times daily. 03/21/20   Clapacs, Madie Reno, MD  folic acid (FOLVITE) 1 MG tablet TAKE ONE TABLET BY MOUTH EVERY DAY 01/07/21   Iloabachie, Chioma E, NP  lisinopril (ZESTRIL) 40 MG tablet TAKE ONE TABLET BY MOUTH EVERY DAY 09/18/20   Iloabachie, Chioma E, NP  metoprolol tartrate (LOPRESSOR) 25 MG  tablet Take 1 tablet (25 mg total) by mouth daily. 11/30/20 12/30/20  Wyvonnia Dusky, MD  pantoprazole (PROTONIX) 40 MG tablet Take 1 tablet (40 mg total) by mouth daily. 10/15/20   Iloabachie, Chioma E, NP  sulfamethoxazole-trimethoprim (BACTRIM DS) 800-160 MG tablet Take 1 tablet by mouth 2 (two) times daily. 12/31/20   Sable Feil, PA-C  thiamine 100 MG tablet Take 1 tablet (100 mg total) by mouth daily. 10/22/20   Iloabachie, Chioma E, NP  VENTOLIN HFA 108 (90 Base) MCG/ACT inhaler INHALE 2 PUFFS EVERY 4 HOURS AS NEEDED 10/22/20   Iloabachie, Chioma E, NP    Allergies Percocet [oxycodone-acetaminophen]  Family History  Problem Relation Age of Onset  . Alcohol abuse Father   . Breast cancer Mother 33    Social History Social History   Tobacco Use  . Smoking  status: Former Smoker    Types: Cigarettes  . Smokeless tobacco: Never Used  . Tobacco comment: quit 30 years ago  Vaping Use  . Vaping Use: Never used  Substance Use Topics  . Alcohol use: Yes    Alcohol/week: 12.0 standard drinks    Types: 12 Shots of liquor per week  . Drug use: No    Review of Systems Constitutional: No fever/chills Eyes: No visual changes. ENT: Reports he is having some neck pain after the fall yesterday located along the lateral right side of his neck feels a little sore worse with movement. Cardiovascular: Denies chest pain. Respiratory: Denies shortness of breath. Gastrointestinal: No abdominal pain.   Musculoskeletal: Negative for back pain. Skin: Negative for rash. Neurological: Negative for  areas of focal weakness or numbness.  Moderate right-sided headache also where he fell and struck his head.    ____________________________________________   PHYSICAL EXAM:  VITAL SIGNS: ED Triage Vitals  Enc Vitals Group     BP 01/24/21 2041 130/86     Pulse Rate 01/24/21 2041 92     Resp 01/24/21 2041 12     Temp 01/24/21 2041 97.6 F (36.4 C)     Temp Source 01/24/21 2041 Oral      SpO2 01/24/21 2041 100 %     Weight 01/24/21 2041 185 lb 3 oz (84 kg)     Height 01/24/21 2041 6' (1.829 m)     Head Circumference --      Peak Flow --      Pain Score 01/24/21 2042 8     Pain Loc --      Pain Edu? --      Excl. in [place]? --     Constitutional: Alert and oriented. Well appearing and in no acute distress.  He is generally pleasant, but he is slightly tearful as well reporting he is upset that he relapsed drinking again.  He denies any desire to harm himself or anyone else, reports he is just sad that he started drinking again after he had been sober for about 6 days. Eyes: Conjunctivae are normal. Head: Atraumatic.  Does report some slight tenderness to palpation over the right temporal region but no obvious evidence of trauma. Nose: No congestion/rhinnorhea. Mouth/Throat: Mucous membranes are moist. Neck: No stridor.  Mild tenderness in the paraspinous region of the right side of the neck.  No bruising or injury. Cardiovascular: Normal rate, regular rhythm. Grossly normal heart sounds.  Good peripheral circulation. Respiratory: Normal respiratory effort.  No retractions. Lungs CTAB. Gastrointestinal: Soft and nontender. No distention. Musculoskeletal: No lower extremity tenderness nor edema. Neurologic:  Normal speech and language however he is tearful at times.  Fully aware alert and oriented able to converse. No gross focal neurologic deficits are appreciated.  Skin:  Skin is warm, dry and intact. No rash noted. Psychiatric: Mood and affect are somewhat sad, he reports sadness about relapsing of alcohol.    ____________________________________________   LABS (all labs ordered are listed, but only abnormal results are displayed)  Labs Reviewed  CBG MONITORING, ED - Abnormal; Notable for the following components:      Result Value   Glucose-Capillary 101 (*)    All other components within normal limits    ____________________________________________  EKG   ____________________________________________  RADIOLOGY  CT Head Wo Contrast  Result Date: 01/24/2021 CLINICAL DATA:  Status post fall. EXAM: CT HEAD WITHOUT CONTRAST TECHNIQUE: Contiguous axial images were obtained from the base of the skull  through the vertex without intravenous contrast. COMPARISON:  January 17, 2021 FINDINGS: Brain: There is mild cerebral atrophy with widening of the extra-axial spaces and ventricular dilatation. There are areas of decreased attenuation within the white matter tracts of the supratentorial brain, consistent with microvascular disease changes. Vascular: No hyperdense vessel or unexpected calcification. Skull: Normal. Negative for fracture or focal lesion. Sinuses/Orbits: There is mild bilateral ethmoid sinus mucosal thickening. Other: None. IMPRESSION: 1. Mild cerebral atrophy. 2. No acute intracranial abnormality. Electronically Signed   By: Virgina Norfolk M.D.   On: 01/24/2021 23:33   CT Cervical Spine Wo Contrast  Result Date: 01/24/2021 CLINICAL DATA:  Status post fall. EXAM: CT CERVICAL SPINE WITHOUT CONTRAST TECHNIQUE: Multidetector CT imaging of the cervical spine was performed without intravenous contrast. Multiplanar CT image reconstructions were also generated. COMPARISON:  January 17, 2021 FINDINGS: Alignment: Normal. Skull base and vertebrae: No acute fracture. No primary bone lesion or focal pathologic process. Soft tissues and spinal canal: No prevertebral fluid or swelling. No visible canal hematoma. Disc levels: Mild endplate sclerosis and mild anterior osteophyte formation is seen at the levels of C3-C4, C5-C6 and C6-C7. There is mild intervertebral disc space narrowing at the level of C5-C6. Moderate severity intervertebral disc space narrowing is seen at the level of C6-C7. Normal bilateral multilevel facet joints are noted. Upper chest: Negative. Other: None. IMPRESSION: 1. Degenerative changes  at the levels of C3-C4, C5-C6 and C6-C7. 2. No acute cervical spine fracture. Electronically Signed   By: Virgina Norfolk M.D.   On: 01/24/2021 23:36    CT head reviewed negative for acute.  CT cervical spine negative for acute fracture. ____________________________________________   PROCEDURES  Procedure(s) performed: None  Procedures  Critical Care performed: No  ____________________________________________   INITIAL IMPRESSION / ASSESSMENT AND PLAN / ED COURSE  Pertinent labs & imaging results that were available during my care of the patient were reviewed by me and considered in my medical decision making (see chart for details).   Patient presents for evaluation of fall.  This appears to be some correlation with use of Librium and/or alcohol abuse or both.  Initially felt to have some evidence of intoxication by triage nurses, but by my exam he is tearful but does not overtly appear clinically intoxicated.  He does have some just slight slurring of speech.  He is awake and fully oriented, remorseful about drinking.  He has had multiple presentations with similar, and reviewed his recent evaluation he had labs drawn which were evaluated on the fifth.  No associated chest pain or difficulty breathing.  He is awake alert fully oriented with normal hemodynamics.  ----------------------------------------- 12:09 AM on 01/25/2021 -----------------------------------------  Got patient up to trial ambulation, he is slightly unsteady on his feet at this point.  He is awake and alert and agreeable to resting for a couple more hours to allow him to presumptively sober up and if improved be able to be discharged.  Ongoing care Dr. Alfred Levins        ____________________________________________   FINAL CLINICAL IMPRESSION(S) / ED DIAGNOSES  Final diagnoses:  Benzodiazepine abuse ([place])  Fall, initial encounter        Note:  This document was prepared using Therapist, sports and may include unintentional dictation errors       Delman Kitten, MD 01/25/21 0010

## 2021-01-24 NOTE — ED Triage Notes (Addendum)
Pt states is here for etoh and is having neck pain/headache. Pt states is also having bilateral knee pain. Pt appears intoxicated. Pt states he has been taking his librium and consuming etoh together. Pt denies known fall where striking head. States did fall two days ago an dlanded on biltareral knees.

## 2021-01-24 NOTE — ED Provider Notes (Incomplete)
[facility] Emergency Department Provider Note   ____________________________________________   Event Date/Time   First MD Initiated Contact with Patient 01/24/21 2156     (approximate)  I have reviewed the triage vital signs and the nursing notes.   HISTORY  Chief Complaint Alcohol Intoxication and Neck Pain    HPI Miguel [name] is a 60 y.o. male with a history of multiple evaluations for alcohol abuse, as well as frequent falls and injuries  Patient presents today reports to me yesterday that he fell.  He reports he struck his head and his neck is been feeling sore in the right side of his head is been causing moderate headache since the fall yesterday.  Reports it was his wife's birthday party, and that he had been sober for about 6 days.  He began using alcohol once again, and reports that he had only small amount of alcohol to drink tonight but wanted to come to the hospital because he continues to have achiness and pain in his neck.  No numbness or weakness.  No trouble walking.  No chest pain or trouble breathing.  Reports he stumbled.  He also was prescribed Librium recently, he is been using a taper but reports that he did use a total of 6 Librium capsule test today for alcohol withdrawal but also drank some alcohol with that today as well.  He otherwise reports he is in normal health.  He prepared a birthday party for his wife yesterday, he had some guests attend.  He denies any recent illnesses or fevers.  He was feeling pretty well but now feels sad and a bit remorseful that he relapsed to drinking once again.  Denies desire to harm himself or anyone else  Past Medical History:  Diagnosis Date  . Alcohol abuse   . Anxiety   . Asthma   . Family history of breast cancer   . GERD (gastroesophageal reflux disease)   . Gout   . Hx of colonic polyps   . Hypertension   . Kidney stone   . 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 OCD  (obsessive compulsive disorder)   . Renal colic     Patient Active Problem List   Diagnosis Date Noted  . Hypotension 11/28/2020  . Genetic testing 07/15/2020  . 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 Right ankle pain 06/26/2020  . Family history of breast cancer   . Hx of colonic polyps   . Hospital discharge follow-up 06/05/2020  . Kidney function test abnormal 06/05/2020  . Abdominal pain 04/12/2020  . Hypertensive urgency 04/12/2020  . Alcohol withdrawal ([place]) 03/04/2020  . Right-sided chest wall pain 02/28/2020  . Alcohol abuse with alcohol-induced mood disorder ([place]) 12/02/2019  . Moderate episode of recurrent major depressive disorder ([place])   . Health care maintenance 10/26/2019  . Right knee pain 10/26/2019  . Generalized anxiety disorder 10/14/2019  . MDD (major depressive disorder), recurrent severe, without psychosis ([place]) 07/27/2019  . Social anxiety disorder 07/27/2019  . Left-sided chest wall pain 06/14/2019  . Alcoholic cirrhosis of liver without ascites ([place]) 06/14/2019  . Alcohol abuse 06/14/2019  . AKI (acute kidney injury) ([place]) 05/27/2019  . Alcohol withdrawal syndrome without complication ([place]) 66/44/0347  . Alcoholic intoxication without complication ([place])   . Sepsis ([place]) 03/08/2019  . Breast lump or mass 10/04/2018  . Sepsis secondary to UTI ([place]) 09/14/2018  . Asthma 07/07/2018  . GERD (  gastroesophageal reflux disease) 07/07/2018  . OCD (obsessive compulsive disorder) 07/07/2018  . Self-inflicted laceration of left wrist ([place]) 03/10/2018  . Leg hematoma 12/25/2017  . Suicide and self-inflicted injury by cutting and piercing instrument ([place]) 03/10/2017  . Severe recurrent major 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 ([place]) 03/09/2017  . Substance induced mood disorder ([place]) 08/15/2016  . Involuntary commitment 08/15/2016  . Alcohol use disorder, severe, dependence ([place]) 02/05/2016  . Hypertension 12/05/2015  . Tachycardia 12/05/2015   . Gout 11/13/2015  . Chronic back pain 05/02/2015    Past Surgical History:  Procedure Laterality Date  . CHOLECYSTECTOMY  2012  . COLONOSCOPY    . COLONOSCOPY WITH PROPOFOL N/A 05/28/2020   Procedure: COLONOSCOPY WITH PROPOFOL;  Surgeon: [name], MD;  Location: [facility] ENDOSCOPY;  Service: Gastroenterology;  Laterality: N/A;  . EXTRACORPOREAL SHOCK WAVE LITHOTRIPSY Left 01/12/2019   Procedure: EXTRACORPOREAL SHOCK WAVE LITHOTRIPSY (ESWL);  Surgeon: Billey Co, MD;  Location: [facility] ORS;  Service: Urology;  Laterality: Left;  . UPPER GI ENDOSCOPY      Prior to Admission medications   Medication Sig Start Date End Date Taking? Authorizing Provider  allopurinol (ZYLOPRIM) 300 MG tablet TAKE ONE TABLET BY MOUTH EVERY DAY 09/18/20   Iloabachie, Chioma E, NP  ascorbic acid (VITAMIN C) 500 MG tablet Take by mouth. 08/01/19   [provider]  chlordiazePOXIDE (LIBRIUM) 5 MG capsule Take 6 caps (30 mg) 4x daily on day 1. Take 5 caps (25 mg) 4x daily on day 2. Take 4 caps (20 mg) 4x daily on day 3. Take 3 caps (15 mg) 4x daily on day 4. Take 2 caps (10 mg) 4x daily on day 5. Take 2 caps (10 mg) 3x daily on day 6. Take 1 cap (5 mg) 3x daily on day 7. Take 1 cap (5 mg) twice daily on day 8. Take 1 cap (5 mg) in the evening on day 9. 01/18/21   Hinda Kehr, MD  citalopram (CELEXA) 20 MG tablet Take 1 tablet (20 mg total) by mouth daily. 03/21/20 03/21/21  Clapacs, Madie Reno, MD  fexofenadine-pseudoephedrine (ALLEGRA-D) 60-120 MG 12 hr tablet Take 1 tablet by mouth 2 (two) times daily. 12/31/20   Sable Feil, PA-C  Fluticasone-Salmeterol (ADVAIR DISKUS) 100-50 MCG/DOSE AEPB Inhale 1 puff into the lungs 2 (two) times daily. 03/21/20   Clapacs, Madie Reno, MD  folic acid (FOLVITE) 1 MG tablet TAKE ONE TABLET BY MOUTH EVERY DAY 01/07/21   Iloabachie, Chioma E, NP  lisinopril (ZESTRIL) 40 MG tablet TAKE ONE TABLET BY MOUTH EVERY DAY 09/18/20   Iloabachie, Chioma E, NP  metoprolol tartrate (LOPRESSOR) 25 MG  tablet Take 1 tablet (25 mg total) by mouth daily. 11/30/20 12/30/20  Wyvonnia Dusky, MD  pantoprazole (PROTONIX) 40 MG tablet Take 1 tablet (40 mg total) by mouth daily. 10/15/20   Iloabachie, Chioma E, NP  sulfamethoxazole-trimethoprim (BACTRIM DS) 800-160 MG tablet Take 1 tablet by mouth 2 (two) times daily. 12/31/20   Sable Feil, PA-C  thiamine 100 MG tablet Take 1 tablet (100 mg total) by mouth daily. 10/22/20   Iloabachie, Chioma E, NP  VENTOLIN HFA 108 (90 Base) MCG/ACT inhaler INHALE 2 PUFFS EVERY 4 HOURS AS NEEDED 10/22/20   Iloabachie, Chioma E, NP    Allergies Percocet [oxycodone-acetaminophen]  Family History  Problem Relation Age of Onset  . Alcohol abuse Father   . Breast cancer Mother 33    Social History Social History   Tobacco Use  . Smoking  status: Former Smoker    Types: Cigarettes  . Smokeless tobacco: Never Used  . Tobacco comment: quit 30 years ago  Vaping Use  . Vaping Use: Never used  Substance Use Topics  . Alcohol use: Yes    Alcohol/week: 12.0 standard drinks    Types: 12 Shots of liquor per week  . Drug use: No    Review of Systems Constitutional: No fever/chills Eyes: No visual changes. ENT: Reports he is having some neck pain after the fall yesterday located along the lateral right side of his neck feels a little sore worse with movement. Cardiovascular: Denies chest pain. Respiratory: Denies shortness of breath. Gastrointestinal: No abdominal pain.   Musculoskeletal: Negative for back pain. Skin: Negative for rash. Neurological: Negative for  areas of focal weakness or numbness.  Moderate right-sided headache also where he fell and struck his head.    ____________________________________________   PHYSICAL EXAM:  VITAL SIGNS: ED Triage Vitals  Enc Vitals Group     BP 01/24/21 2041 130/86     Pulse Rate 01/24/21 2041 92     Resp 01/24/21 2041 12     Temp 01/24/21 2041 97.6 F (36.4 C)     Temp Source 01/24/21 2041 Oral      SpO2 01/24/21 2041 100 %     Weight 01/24/21 2041 185 lb 3 oz (84 kg)     Height 01/24/21 2041 6' (1.829 m)     Head Circumference --      Peak Flow --      Pain Score 01/24/21 2042 8     Pain Loc --      Pain Edu? --      Excl. in [place]? --     Constitutional: Alert and oriented. Well appearing and in no acute distress.  He is generally pleasant, but he is slightly tearful as well reporting he is upset that he relapsed drinking again.  He denies any desire to harm himself or anyone else, reports he is just sad that he started drinking again after he had been sober for about 6 days. Eyes: Conjunctivae are normal. Head: Atraumatic.  Does report some slight tenderness to palpation over the right temporal region but no obvious evidence of trauma. Nose: No congestion/rhinnorhea. Mouth/Throat: Mucous membranes are moist. Neck: No stridor.  Mild tenderness in the paraspinous region of the right side of the neck.  No bruising or injury. Cardiovascular: Normal rate, regular rhythm. Grossly normal heart sounds.  Good peripheral circulation. Respiratory: Normal respiratory effort.  No retractions. Lungs CTAB. Gastrointestinal: Soft and nontender. No distention. Musculoskeletal: No lower extremity tenderness nor edema. Neurologic:  Normal speech and language however he is tearful at times.  Fully aware alert and oriented able to converse. No gross focal neurologic deficits are appreciated.  Skin:  Skin is warm, dry and intact. No rash noted. Psychiatric: Mood and affect are somewhat sad, he reports sadness about relapsing of alcohol.    ____________________________________________   LABS (all labs ordered are listed, but only abnormal results are displayed)  Labs Reviewed  CBG MONITORING, ED - Abnormal; Notable for the following components:      Result Value   Glucose-Capillary 101 (*)    All other components within normal limits    ____________________________________________  EKG   ____________________________________________  RADIOLOGY  CT Head Wo Contrast  Result Date: 01/24/2021 CLINICAL DATA:  Status post fall. EXAM: CT HEAD WITHOUT CONTRAST TECHNIQUE: Contiguous axial images were obtained from the base of the skull  through the vertex without intravenous contrast. COMPARISON:  January 17, 2021 FINDINGS: Brain: There is mild cerebral atrophy with widening of the extra-axial spaces and ventricular dilatation. There are areas of decreased attenuation within the white matter tracts of the supratentorial brain, consistent with microvascular disease changes. Vascular: No hyperdense vessel or unexpected calcification. Skull: Normal. Negative for fracture or focal lesion. Sinuses/Orbits: There is mild bilateral ethmoid sinus mucosal thickening. Other: None. IMPRESSION: 1. Mild cerebral atrophy. 2. No acute intracranial abnormality. Electronically Signed   By: Virgina Norfolk M.D.   On: 01/24/2021 23:33   CT Cervical Spine Wo Contrast  Result Date: 01/24/2021 CLINICAL DATA:  Status post fall. EXAM: CT CERVICAL SPINE WITHOUT CONTRAST TECHNIQUE: Multidetector CT imaging of the cervical spine was performed without intravenous contrast. Multiplanar CT image reconstructions were also generated. COMPARISON:  January 17, 2021 FINDINGS: Alignment: Normal. Skull base and vertebrae: No acute fracture. No primary bone lesion or focal pathologic process. Soft tissues and spinal canal: No prevertebral fluid or swelling. No visible canal hematoma. Disc levels: Mild endplate sclerosis and mild anterior osteophyte formation is seen at the levels of C3-C4, C5-C6 and C6-C7. There is mild intervertebral disc space narrowing at the level of C5-C6. Moderate severity intervertebral disc space narrowing is seen at the level of C6-C7. Normal bilateral multilevel facet joints are noted. Upper chest: Negative. Other: None. IMPRESSION: 1. Degenerative changes  at the levels of C3-C4, C5-C6 and C6-C7. 2. No acute cervical spine fracture. Electronically Signed   By: Virgina Norfolk M.D.   On: 01/24/2021 23:36    CT head reviewed negative for acute.  CT cervical spine negative for acute fracture. ____________________________________________   PROCEDURES  Procedure(s) performed: None  Procedures  Critical Care performed: No  ____________________________________________   INITIAL IMPRESSION / ASSESSMENT AND PLAN / ED COURSE  Pertinent labs & imaging results that were available during my care of the patient were reviewed by me and considered in my medical decision making (see chart for details).   Patient presents for evaluation of fall.  This appears to be some correlation with use of Librium and/or alcohol abuse or both.  Initially felt to have some evidence of intoxication by triage nurses, but by my exam he is tearful but does not overtly appear clinically intoxicated.  He does have some just slight slurring of speech.  He is awake and fully oriented, remorseful about drinking.  He has had multiple presentations with similar, and reviewed his recent evaluation he had labs drawn which were evaluated on the fifth.  No associated chest pain or difficulty breathing.  He is awake alert fully oriented with normal hemodynamics.        ____________________________________________   FINAL CLINICAL IMPRESSION(S) / ED DIAGNOSES  Final diagnoses:  None        Note:  This document was prepared using Dragon voice recognition software and may include unintentional dictation errors  {** REMINDER - THIS NOTE IS NOT A FINAL MEDICAL RECORD UNTIL IT IS SIGNED.  UNTIL THEN, THE CONTENT BELOW MAY REFLECT INFORMATION FROM A DOCUMENTATION TEMPLATE, NOT THE ACTUAL PATIENT VISIT. **}

## 2021-01-24 NOTE — ED Notes (Signed)
This RN, first nurse, assisted pt to the bathroom. Pt is unsteady on feet.

## 2021-01-24 NOTE — ED Notes (Signed)
Secure chat message sent to dr. Jacqualine Code to inquire about ct head/neck due to pt's etoh/libruim ingestion, recent fall and headache/neck pain

## 2021-01-24 NOTE — ED Notes (Signed)
No new orders received from dr. Jacqualine Code.

## 2021-01-25 NOTE — ED Notes (Signed)
Pt sleeping with even and unlabored resp - pt kicked off left shoe - placed under bed

## 2021-01-25 NOTE — ED Notes (Signed)
Pt sleeping with even and unlabored resp 

## 2021-01-25 NOTE — ED Notes (Addendum)
Pt was sleeping soundly in clothes in room 18, Pt moved to hall bed, pt moderately steady on feet, pt denies need for toilet, brought to bed in wheelchair, odor of ETOH, pt given warm blanket

## 2021-01-25 NOTE — ED Notes (Signed)
Pt discharged home. VS stable  Pt denies SI. RN called cab for patient to have a ride home.  Discharge instructions reviewed with patient.

## 2021-01-25 NOTE — ED Provider Notes (Signed)
I assumed care of this patient approximately 0 700.  In brief patient presented last night intoxicated from alcohol.  On my assessment he is clinically sober and ambulating with steady gait.  He has no slurred speech.  He denies any complaints and wishes to be discharged home.  Discharge in stable condition.   [name], MD 01/25/21 [phone]

## 2021-01-29 ENCOUNTER — Telehealth: Payer: Self-pay | Admitting: Pharmacist

## 2021-01-29 NOTE — Telephone Encounter (Signed)
01/29/2021 12:39:16 PM - ProAir pending & recert  -- Miguel [name] - Wednesday, January 29, 2021 12:37 PM -- I have received the signed provider portion of [employer] application for ProAir--holding for patient to return his portion mailed to patient 01/07/2021 PATIENT HAS NOT RETURNED [employer]ANCIAL--[facility] ELIG. HAS ENDED TILL THIS RETURNED.

## 2021-02-03 ENCOUNTER — Telehealth: Payer: Self-pay | Admitting: Pharmacy Technician

## 2021-02-03 NOTE — Telephone Encounter (Signed)
Patient failed to provide 2022 proof of income.  No additional medication assistance will be provided by [facility] without the required proof of income documentation.  Patient notified by letter.  [place], [place]  [id number]    February 03, 2021    Gr[facility] [place] [address] [place], [place]  [address]  Dear [name]:  This is to inform you that you are no longer eligible to receive medication assistance at [facility].  The reason(s) are:    _____Your total gross monthly household income exceeds 250% of the Federal Poverty Level.   _____Tangible assets (savings, checking, stocks/bonds, pension, retirement, etc.) exceeds our limit  _____You are eligible to receive benefits from [facility], [facility] or HIV Medication              Assistance Program _____You are eligible to receive benefits from a Medicare Part "D" plan _____You have prescription insurance  _____You are not an [facility] resident __X__Failure to provide all requested proof of income information for 2022.    Medication assistance will resume once all requested financial information has been returned to our clinic.  If you have questions, please contact our clinic at [phone].    Thank you,  [facility]

## 2021-02-08 IMAGING — CR DG CHEST 2V
1 series · 2 of 2 positions shown · non-contrast
Comparison: Radiograph 04/11/2020

CLINICAL DATA: Shortness of breath which reportedly began after
exposure to OZIUM air freshener

EXAM:
CHEST - 2 VIEW

[Series 1: dg chest 2 view · 0.14mm/px · 2 of 2 slices shown]
[im 1/2]
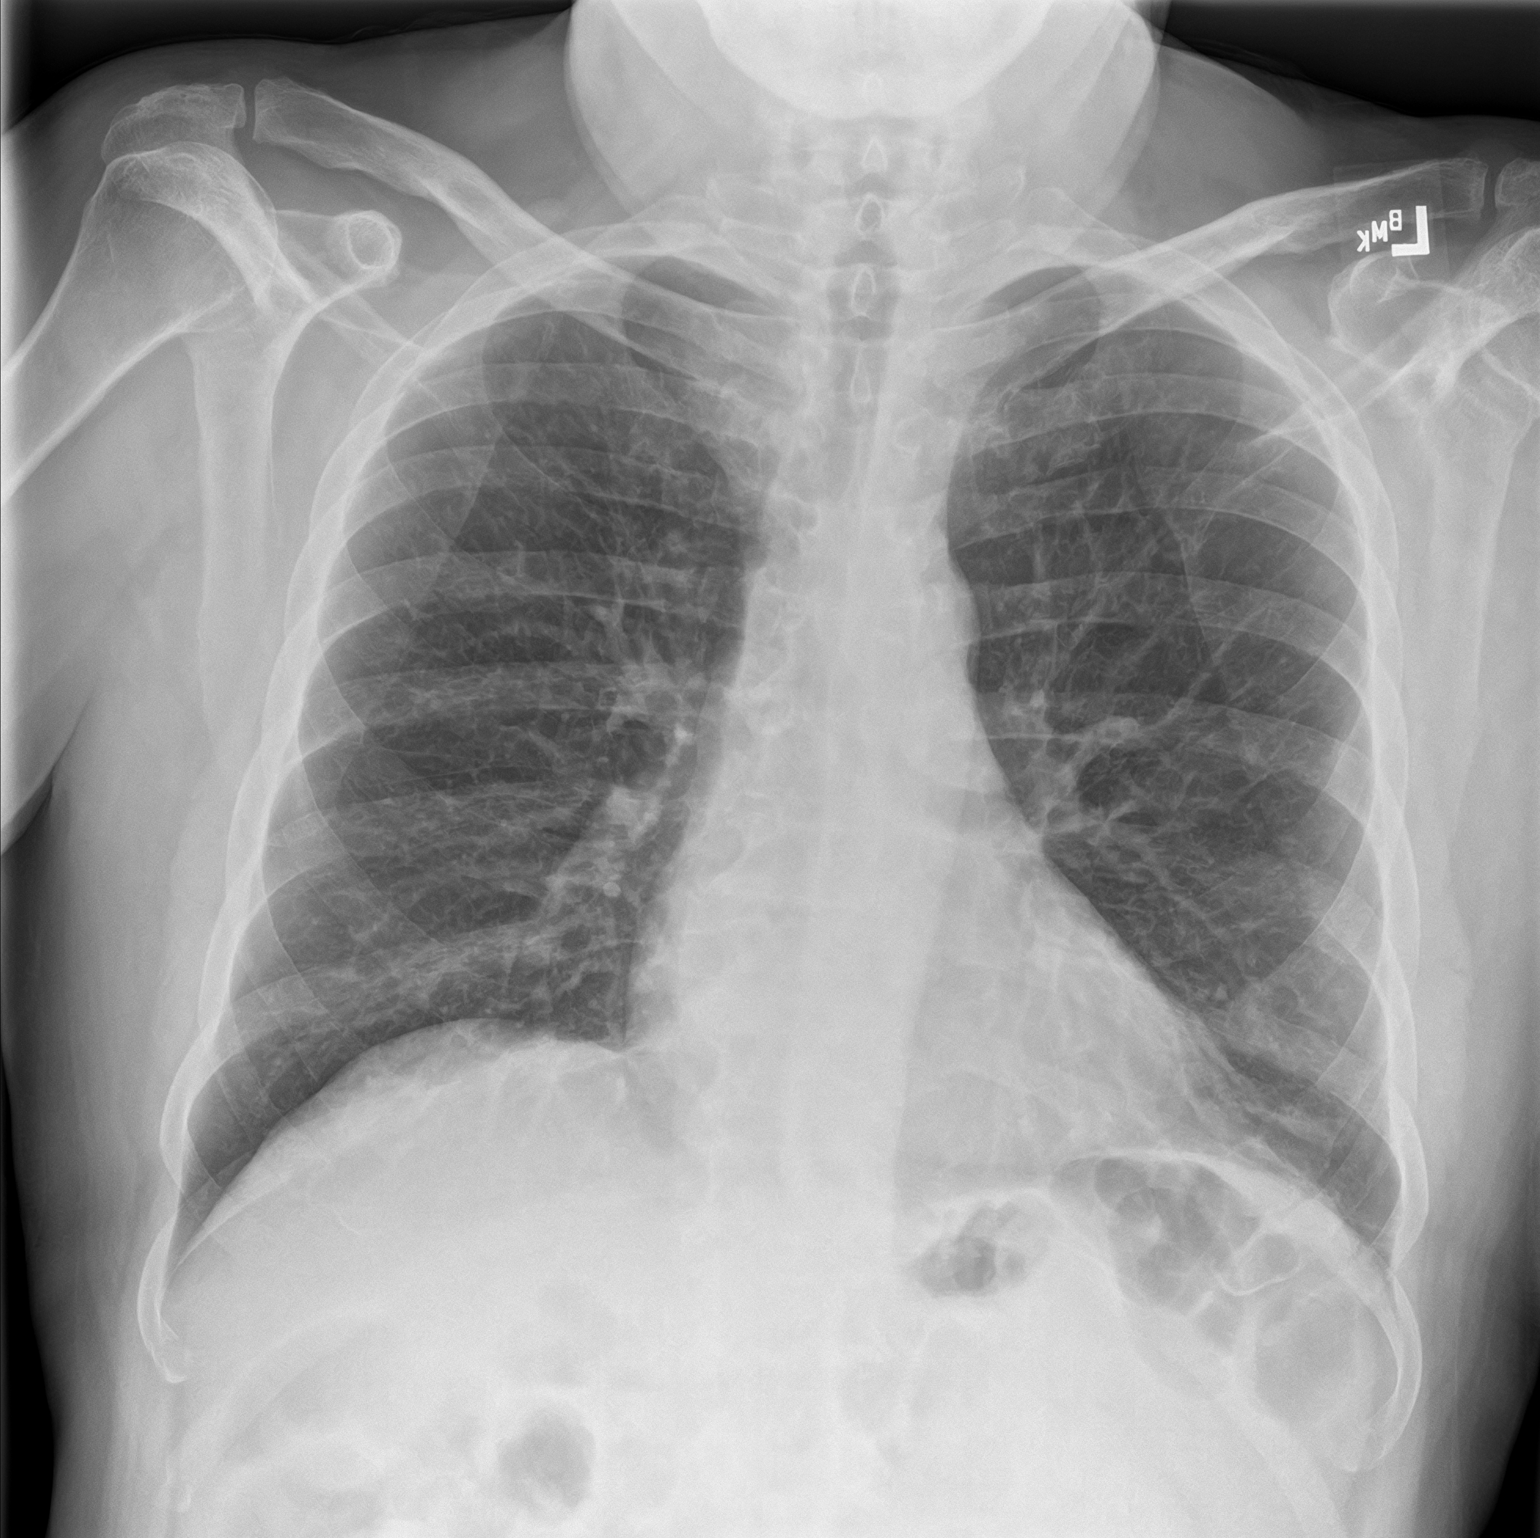
[im 2/2]
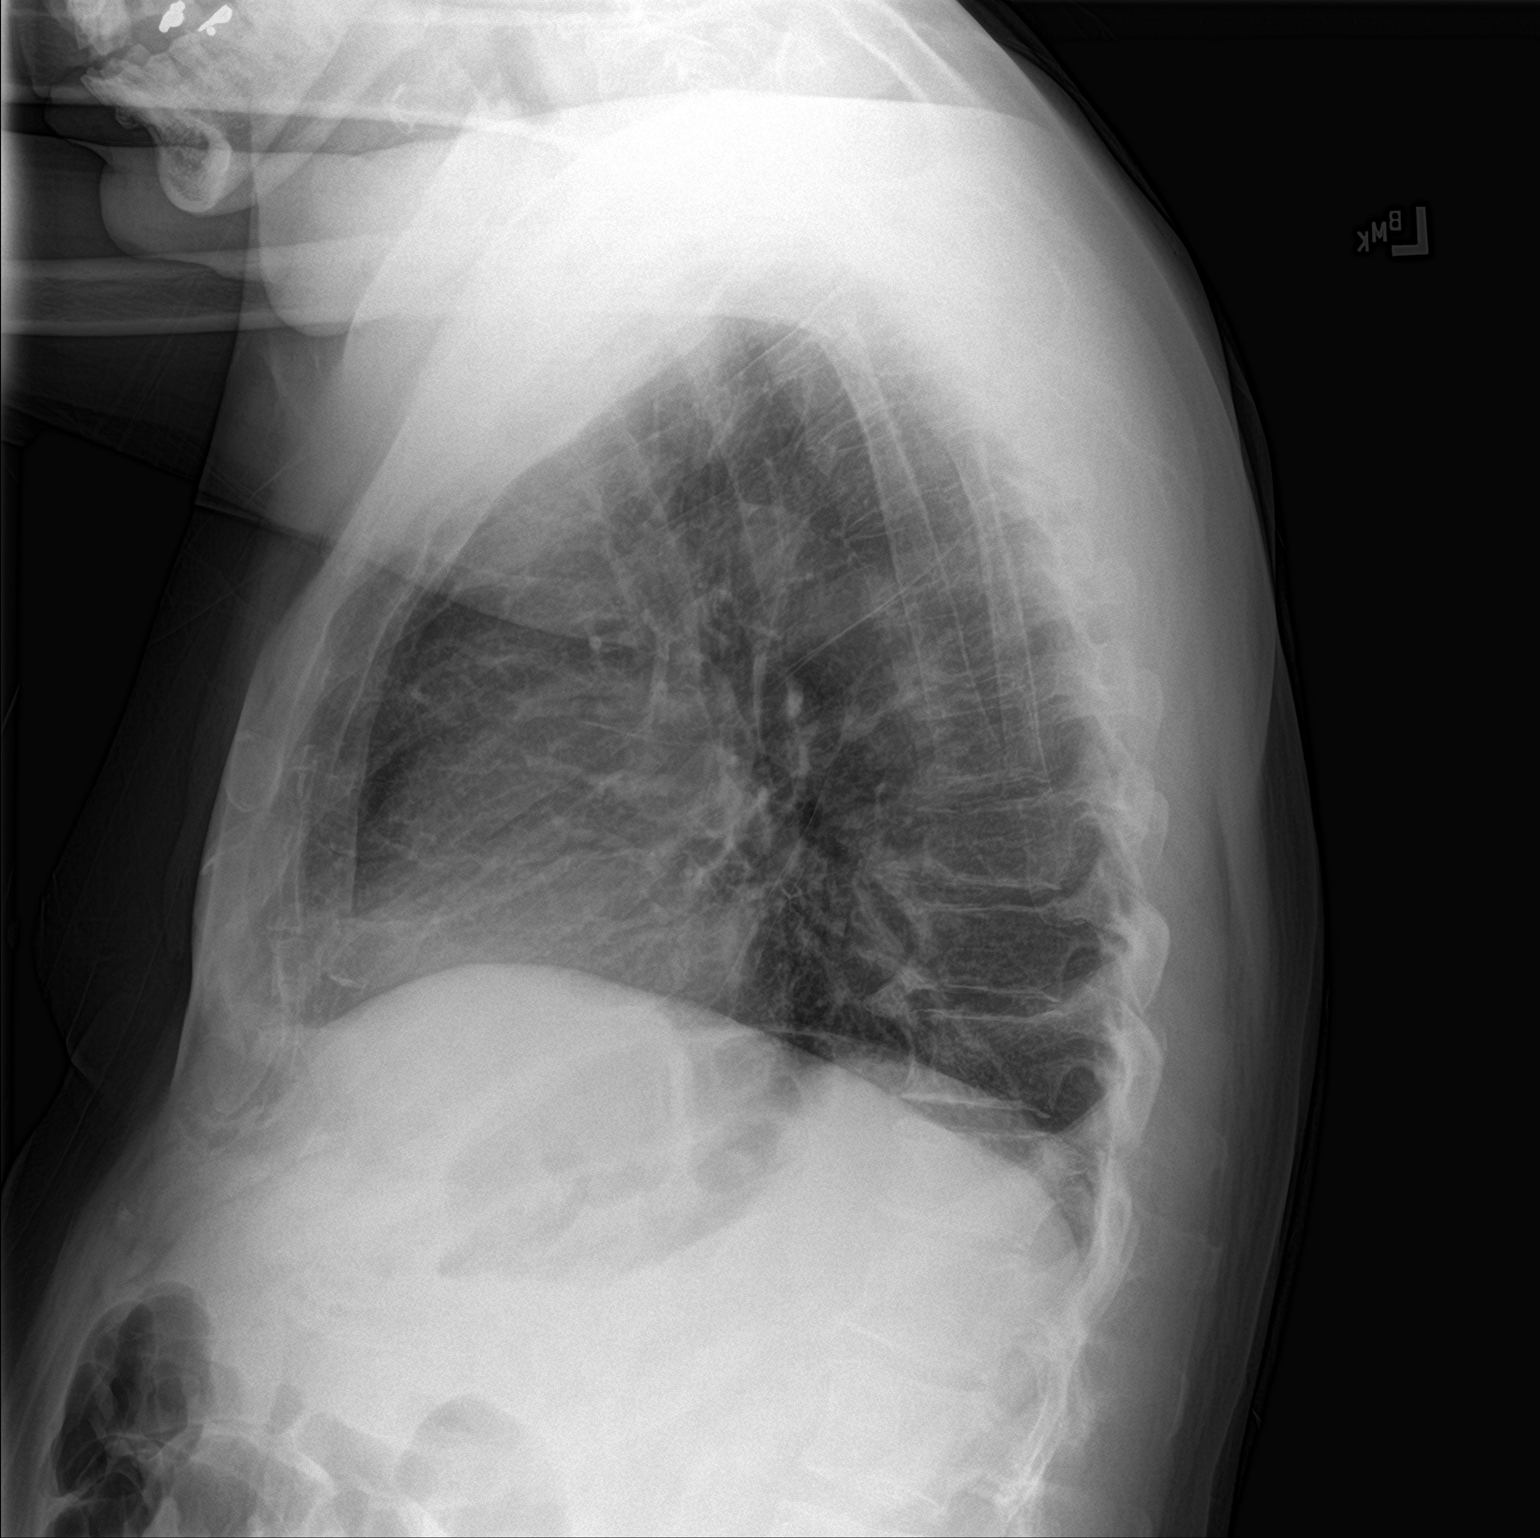

[2 of 2 positions shown; findings below may reference images not displayed]

FINDINGS: No consolidation, features of edema, pneumothorax, or effusion.
Pulmonary vascularity is normally distributed. The cardiomediastinal
contours are unremarkable. No acute osseous or soft tissue
abnormality.
IMPRESSION: No acute cardiopulmonary abnormality.

## 2021-02-10 ENCOUNTER — Other Ambulatory Visit: Payer: Self-pay

## 2021-02-10 ENCOUNTER — Emergency Department
Admission: EM | Admit: 2021-02-10 | Discharge: 2021-02-11 | Disposition: A | Discharge status: Home / Self Care | Payer: Self-pay | Attending: Emergency Medicine | Admitting: Emergency Medicine

## 2021-02-10 ENCOUNTER — Emergency Department: Payer: Self-pay

## 2021-02-10 DIAGNOSIS — Z87891 Personal history of nicotine dependence: Secondary | ICD-10-CM | POA: Insufficient documentation

## 2021-02-10 DIAGNOSIS — Z79899 Other long term (current) drug therapy: Secondary | ICD-10-CM | POA: Insufficient documentation

## 2021-02-10 DIAGNOSIS — Z7951 Long term (current) use of inhaled steroids: Secondary | ICD-10-CM | POA: Insufficient documentation

## 2021-02-10 DIAGNOSIS — Y908 Blood alcohol level of 240 mg/100 ml or more: Secondary | ICD-10-CM | POA: Insufficient documentation

## 2021-02-10 DIAGNOSIS — J45909 Unspecified asthma, uncomplicated: Secondary | ICD-10-CM | POA: Insufficient documentation

## 2021-02-10 DIAGNOSIS — F1010 Alcohol abuse, uncomplicated: Secondary | ICD-10-CM | POA: Insufficient documentation

## 2021-02-10 DIAGNOSIS — I10 Essential (primary) hypertension: Secondary | ICD-10-CM | POA: Insufficient documentation

## 2021-02-10 LAB — SALICYLATE LEVEL: Salicylate Lvl: <7 mg/dL — ABNORMAL LOW (ref 7.0–30.0)

## 2021-02-10 LAB — CBC
HCT: 39.7 % (ref 39.0–52.0)
Hemoglobin: 12.7 g/dL — ABNORMAL LOW (ref 13.0–17.0)
MCH: 27.1 pg (ref 26.0–34.0)
MCHC: 32 g/dL (ref 30.0–36.0)
MCV: 84.6 fL (ref 80.0–100.0)
Platelets: 282 10*3/uL (ref 150–400)
RBC: 4.69 MIL/uL (ref 4.22–5.81)
RDW: 16.5 % — ABNORMAL HIGH (ref 11.5–15.5)
WBC: 7.6 10*3/uL (ref 4.0–10.5)
nRBC: 0 % (ref 0.0–0.2)

## 2021-02-10 LAB — COMPREHENSIVE METABOLIC PANEL
ALT: 11 U/L (ref 0–44)
AST: 15 U/L (ref 15–41)
Albumin: 3.9 g/dL (ref 3.5–5.0)
Alkaline Phosphatase: 72 U/L (ref 38–126)
Anion gap: 10 (ref 5–15)
BUN: 16 mg/dL (ref 8–23)
CO2: 21 mmol/L — ABNORMAL LOW (ref 22–32)
Calcium: 8.6 mg/dL — ABNORMAL LOW (ref 8.9–10.3)
Chloride: 109 mmol/L (ref 98–111)
Creatinine, Ser: 1.01 mg/dL (ref 0.61–1.24)
GFR, Estimated: >60 mL/min (ref >60)
Glucose, Bld: 124 mg/dL — ABNORMAL HIGH (ref 70–99)
Potassium: 3.7 mmol/L (ref 3.5–5.1)
Sodium: 140 mmol/L (ref 135–145)
Total Bilirubin: 0.4 mg/dL (ref 0.3–1.2)
Total Protein: 6.9 g/dL (ref 6.5–8.1)

## 2021-02-10 LAB — ETHANOL: Alcohol, Ethyl (B): 308 mg/dL (ref <10)

## 2021-02-10 LAB — TROPONIN I (HIGH SENSITIVITY): Troponin I (High Sensitivity): 3 ng/L (ref <18)

## 2021-02-10 LAB — ACETAMINOPHEN LEVEL: Acetaminophen (Tylenol), Serum: <10 ug/mL — ABNORMAL LOW (ref 10–30)

## 2021-02-10 MED ORDER — THIAMINE HCL 100 MG PO TABS: 100.0000 mg | ORAL_TABLET | Freq: Every day | ORAL | Status: DC

## 2021-02-10 MED ORDER — LORAZEPAM 1 MG PO TABS: 1.0000 mg | ORAL_TABLET | ORAL | Status: DC | PRN

## 2021-02-10 MED ORDER — LORAZEPAM 2 MG/ML IJ SOLN: 1.0000 mg | INTRAMUSCULAR | Status: DC | PRN

## 2021-02-10 MED ORDER — THIAMINE HCL 100 MG/ML IJ SOLN: 100.0000 mg | Freq: Every day | INTRAMUSCULAR | Status: DC

## 2021-02-10 MED ORDER — FOLIC ACID 1 MG PO TABS: 1.0000 mg | ORAL_TABLET | Freq: Every day | ORAL | Status: DC

## 2021-02-10 MED ORDER — ADULT MULTIVITAMIN W/MINERALS CH: 1.0000 | ORAL_TABLET | Freq: Every day | ORAL | Status: DC

## 2021-02-10 NOTE — ED Triage Notes (Signed)
Pt took 1/2 pint of vodka, zquil, and melatonin 12mg  x 6 states normally takes 4 at a time. Pt states here for shob for a few days.

## 2021-02-10 NOTE — ED Provider Notes (Signed)
[facility] Emergency Department Provider Note   ____________________________________________   Event Date/Time   First MD Initiated Contact with Patient 02/10/21 2152     (approximate)  I have reviewed the triage vital signs and the nursing notes.   HISTORY  Chief Complaint "Cannot sleep"   HPI Miguel [name] is a 60 y.o. male reports he is here because he cannot sleep.  He tells me he is upset he knows he supposed to take his wife to an appointment tomorrow for a new doctor but he has been drinking.  He could not sleep tonight and is remorseful about how much she drinks, he could not sleep so he took melatonin tablets 12 mg x 6.  He denies any other ingestion  other than also taking a couple of ZzzQuil tablets.  He reports he just cannot sleep.  He had sobriety for about a month.    The patient tells me that he does not want hurt himself or anyone else, he would never harm himself is not suicidal.  Reports he just cannot sleep well.  His drinking has become a problem  Denies being in any pain.  He asked me just for something to help with rest and sleep right now.  Past Medical History:  Diagnosis Date  . Alcohol abuse   . Anxiety   . Asthma   . Family history of breast cancer   . GERD (gastroesophageal reflux disease)   . Gout   . Hx of colonic polyps   . Hypertension   . Kidney stone   . 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 OCD (obsessive compulsive disorder)   . Renal colic     Patient Active Problem List   Diagnosis Date Noted  . Hypotension 11/28/2020  . Genetic testing 07/15/2020  . 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 Right ankle pain 06/26/2020  . Family history of breast cancer   . Hx of colonic polyps   . Hospital discharge follow-up 06/05/2020  . Kidney function test abnormal 06/05/2020  . Abdominal pain 04/12/2020  . Hypertensive urgency 04/12/2020  . Alcohol withdrawal ([place]) 03/04/2020  .  Right-sided chest wall pain 02/28/2020  . Alcohol abuse with alcohol-induced mood disorder ([place]) 12/02/2019  . Moderate episode of recurrent major depressive disorder ([place])   . Health care maintenance 10/26/2019  . Right knee pain 10/26/2019  . Generalized anxiety disorder 10/14/2019  . MDD (major depressive disorder), recurrent severe, without psychosis ([place]) 07/27/2019  . Social anxiety disorder 07/27/2019  . Left-sided chest wall pain 06/14/2019  . Alcoholic cirrhosis of liver without ascites ([place]) 06/14/2019  . Alcohol abuse 06/14/2019  . AKI (acute kidney injury) ([place]) 05/27/2019  . Alcohol withdrawal syndrome without complication ([place]) 74/16/3845  . Alcoholic intoxication without complication ([place])   . Sepsis ([place]) 03/08/2019  . Breast lump or mass 10/04/2018  . Sepsis secondary to UTI ([place]) 09/14/2018  . Asthma 07/07/2018  . GERD (gastroesophageal reflux disease) 07/07/2018  . OCD (obsessive compulsive disorder) 07/07/2018  . Self-inflicted laceration of left wrist ([place]) 03/10/2018  . Leg hematoma 12/25/2017  . Suicide and self-inflicted injury by cutting and piercing instrument ([place]) 03/10/2017  . Severe recurrent major 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 ([place]) 03/09/2017  . Substance induced mood disorder ([place]) 08/15/2016  . Involuntary commitment 08/15/2016  . Alcohol use disorder, severe, dependence ([place]) 02/05/2016  . Hypertension 12/05/2015  . Tachycardia 12/05/2015  . Gout 11/13/2015  .  Chronic back pain 05/02/2015    Past Surgical History:  Procedure Laterality Date  . CHOLECYSTECTOMY  2012  . COLONOSCOPY    . COLONOSCOPY WITH PROPOFOL N/A 05/28/2020   Procedure: COLONOSCOPY WITH PROPOFOL;  Surgeon: [name], MD;  Location: [facility] ENDOSCOPY;  Service: Gastroenterology;  Laterality: N/A;  . EXTRACORPOREAL SHOCK WAVE LITHOTRIPSY Left 01/12/2019   Procedure: EXTRACORPOREAL SHOCK WAVE LITHOTRIPSY (ESWL);  Surgeon: Billey Co, MD;  Location: [facility] ORS;   Service: Urology;  Laterality: Left;  . UPPER GI ENDOSCOPY      Prior to Admission medications   Medication Sig Start Date End Date Taking? Authorizing Provider  allopurinol (ZYLOPRIM) 300 MG tablet TAKE ONE TABLET BY MOUTH EVERY DAY 09/18/20   Iloabachie, Chioma E, NP  ascorbic acid (VITAMIN C) 500 MG tablet Take by mouth. 08/01/19   [provider]  chlordiazePOXIDE (LIBRIUM) 5 MG capsule Take 6 caps (30 mg) 4x daily on day 1. Take 5 caps (25 mg) 4x daily on day 2. Take 4 caps (20 mg) 4x daily on day 3. Take 3 caps (15 mg) 4x daily on day 4. Take 2 caps (10 mg) 4x daily on day 5. Take 2 caps (10 mg) 3x daily on day 6. Take 1 cap (5 mg) 3x daily on day 7. Take 1 cap (5 mg) twice daily on day 8. Take 1 cap (5 mg) in the evening on day 9. 01/18/21   Hinda Kehr, MD  citalopram (CELEXA) 20 MG tablet Take 1 tablet (20 mg total) by mouth daily. 03/21/20 03/21/21  Clapacs, Madie Reno, MD  fexofenadine-pseudoephedrine (ALLEGRA-D) 60-120 MG 12 hr tablet Take 1 tablet by mouth 2 (two) times daily. 12/31/20   Sable Feil, PA-C  Fluticasone-Salmeterol (ADVAIR DISKUS) 100-50 MCG/DOSE AEPB Inhale 1 puff into the lungs 2 (two) times daily. 03/21/20   Clapacs, Madie Reno, MD  folic acid (FOLVITE) 1 MG tablet TAKE ONE TABLET BY MOUTH EVERY DAY 01/07/21   Iloabachie, Chioma E, NP  lisinopril (ZESTRIL) 40 MG tablet TAKE ONE TABLET BY MOUTH EVERY DAY 09/18/20   Iloabachie, Chioma E, NP  metoprolol tartrate (LOPRESSOR) 25 MG tablet Take 1 tablet (25 mg total) by mouth daily. 11/30/20 12/30/20  Wyvonnia Dusky, MD  pantoprazole (PROTONIX) 40 MG tablet Take 1 tablet (40 mg total) by mouth daily. 10/15/20   Iloabachie, Chioma E, NP  sulfamethoxazole-trimethoprim (BACTRIM DS) 800-160 MG tablet Take 1 tablet by mouth 2 (two) times daily. 12/31/20   Sable Feil, PA-C  thiamine 100 MG tablet Take 1 tablet (100 mg total) by mouth daily. 10/22/20   Iloabachie, Chioma E, NP  VENTOLIN HFA 108 (90 Base) MCG/ACT inhaler INHALE 2  PUFFS EVERY 4 HOURS AS NEEDED 10/22/20   Iloabachie, Chioma E, NP    Allergies Percocet [oxycodone-acetaminophen]  Family History  Problem Relation Age of Onset  . Alcohol abuse Father   . Breast cancer Mother 22    Social History Social History   Tobacco Use  . Smoking status: Former Smoker    Types: Cigarettes  . Smokeless tobacco: Never Used  . Tobacco comment: quit 30 years ago  Vaping Use  . Vaping Use: Never used  Substance Use Topics  . Alcohol use: Yes    Alcohol/week: 12.0 standard drinks    Types: 12 Shots of liquor per week  . Drug use: No    Review of Systems Constitutional: No fever/chills or recent illness.  Denies any new falls or injuries Eyes: No visual changes.  ENT: No sore throat. Cardiovascular: Denies chest pain. Respiratory: Denies shortness of breath. Gastrointestinal: No abdominal pain.   Genitourinary: Negative for dysuria. Musculoskeletal: Negative for back pain. Skin: Negative for rash. Neurological: Negative for headaches, areas of focal weakness or numbness.  Reports he is rather restless and cannot sleep well at all anymore.    ____________________________________________   PHYSICAL EXAM:  VITAL SIGNS: ED Triage Vitals  Enc Vitals Group     BP 02/10/21 2114 138/90     Pulse Rate 02/10/21 2114 97     Resp 02/10/21 2114 20     Temp 02/10/21 2114 97.8 F (36.6 C)     Temp Source 02/10/21 2114 Oral     SpO2 02/10/21 2114 98 %     Weight 02/10/21 2116 168 lb (76.2 kg)     Height 02/10/21 2116 6' (1.829 m)     Head Circumference --      Peak Flow --      Pain Score 02/10/21 2115 6     Pain Loc --      Pain Edu? --      Excl. in GC? --     Constitutional: Alert and oriented. Well appearing and in no acute distress.  Seated in bed, conversant.  No distress. Eyes: Conjunctivae are injected bilateral. Head: Atraumatic. Nose: No congestion/rhinnorhea. Mouth/Throat: Mucous membranes are moist. Neck: No stridor.   Cardiovascular: Normal rate, regular rhythm. Grossly normal heart sounds.  Good peripheral circulation. Respiratory: Normal respiratory effort.  No retractions. Lungs CTAB. Gastrointestinal: Soft and nontender. No distention.  No evidence ascites. Musculoskeletal: No lower extremity tenderness nor edema. Neurologic:  Normal speech and language, we will may be some just slight slurring of speech. No gross focal neurologic deficits are appreciated.  Skin:  Skin is warm, dry and intact. No rash noted. Psychiatric: Mood and affect are generally calm and flat, but becomes tearful easily when speaking about the fact that he needs to be responsible husband to help his wife but his drinking is getting in the way of doing things like bring her to appointments.  Does not want to harm himself or anyone else.  He is interested in getting help with potential detox options  He adamantly denies any intentional overdose or ingestion.  Denies taking any product with Tylenol, he reports he only takes Benadryl like ZzzQuil tablets and melatonin's this evening for sleep.  Usually he will take about 4 tablets but today he took a couple extra  ____________________________________________   LABS (all labs ordered are listed, but only abnormal results are displayed)  Labs Reviewed  CBC - Abnormal; Notable for the following components:      Result Value   Hemoglobin 12.7 (*)    RDW 16.5 (*)    All other components within normal limits  COMPREHENSIVE METABOLIC PANEL - Abnormal; Notable for the following components:   CO2 21 (*)    Glucose, Bld 124 (*)    Calcium 8.6 (*)    All other components within normal limits  ETHANOL - Abnormal; Notable for the following components:   Alcohol, Ethyl (B) 308 (*)    All other components within normal limits  ACETAMINOPHEN LEVEL - Abnormal; Notable for the following components:   Acetaminophen (Tylenol), Serum <10 (*)    All other components within normal limits   SALICYLATE LEVEL - Abnormal; Notable for the following components:   Salicylate Lvl <7.0 (*)    All other components within normal limits  TROPONIN I (HIGH   SENSITIVITY)  TROPONIN I (HIGH SENSITIVITY)   ____________________________________________  EKG  Reviewed inter by me at 2120 Normal sinus rhythm, left axis deviation.  No evidence acute ischemia.   ____________________________________________  RADIOLOGY  Chest x-ray negative for acute ____________________________________________   PROCEDURES  Procedure(s) performed: None  Procedures  Critical Care performed: No  ____________________________________________   INITIAL IMPRESSION / ASSESSMENT AND PLAN / ED COURSE  Pertinent labs & imaging results that were available during my care of the patient were reviewed by me and considered in my medical decision making (see chart for details).   Patient presents for what appears to be concerns of alcohol abuse and how this is impacting his ability to care for his wife.  He had reported to EMS shortness of breath but that does not seem to be at all his complaint on arrival here.  Patient is known to our ER, and he does frequently present.  None today appears to be with alcohol abuse, and he denies any acute psychiatric complaint or acute medical complaint at this time other than resting remorse about his drinking and wishing to potentially consider detox.  ----------------------------------------- 12:30 AM on 02/11/2021 -----------------------------------------  As of now requested discharge does not wish to stay further.  He is able to ambulate stably back-and-forth on his own to the bathroom.  He is arrange for a taxi to drive him home.  He does not appear to risk present risks to himself in the form of needing to be under IVC, and I do believe he does maintain the capacity to make his decision.  He does not appear particularly [place] clinically intoxicated.  He is requesting  discharge, will follow up with outpatient resources.  Return precautions and treatment recommendations and follow-up discussed with the patient who is agreeable with the plan.       ____________________________________________   FINAL CLINICAL IMPRESSION(S) / ED DIAGNOSES  Final diagnoses:  Alcohol abuse        Note:  This document was prepared using Dragon voice recognition software and may include unintentional dictation errors       Delman Kitten, MD 02/11/21 0031

## 2021-02-10 NOTE — ED Notes (Signed)
Ethanol level 308 per lab. MD aware.

## 2021-02-10 NOTE — ED Notes (Signed)
Patient given sandwich tray and water to drink.

## 2021-02-10 NOTE — ED Notes (Signed)
Patient to xray at this time

## 2021-02-11 NOTE — ED Notes (Addendum)
Cab called for pt.  Pt to lobby waiting on cab. Discharge int to pt.  Pt did not want to sign for them.   Pt alert.

## 2021-02-11 NOTE — Discharge Instructions (Signed)
You have been seen in the Emergency Department (ED)  today for psychiatric issues.  You have been evaluated by the behavioral medicine specialists and are being referred to:  [facility] [place], [place] [address] Phone:  [phone] or [phone]  Open Access:   Walk-in ASSESSMENT hours, M-W-F, 8:00am - 3:00pm Advanced Acess CRISIS:  M-F, 8:00am - 8:00pm Outpatient Services Office Hours:  M-F, 8:00am - 5:00pm  Please return to the Emergency Department (ED)  immediately if you have ANY thoughts of hurting yourself or anyone else, so that we may help you.  Please avoid alcohol and drug use.  Follow up with your doctor and/or therapist as soon as possible regarding today's ED  visit.   Please follow up any other recommendations and clinic appointments provided by the psychiatry team that saw you in the Emergency Department.   No driving today or while intoxicated or using alcohol.  It is very important that you follow closely with your treatment team including your primary care provider.  Please call them later today for follow-up

## 2021-02-21 IMAGING — CR DG CHEST 2V
2 series · 2 of 2 positions shown · non-contrast
Comparison: 05/10/2020

CLINICAL DATA: Shortness of breath

EXAM:
CHEST - 2 VIEW

[chest pa]
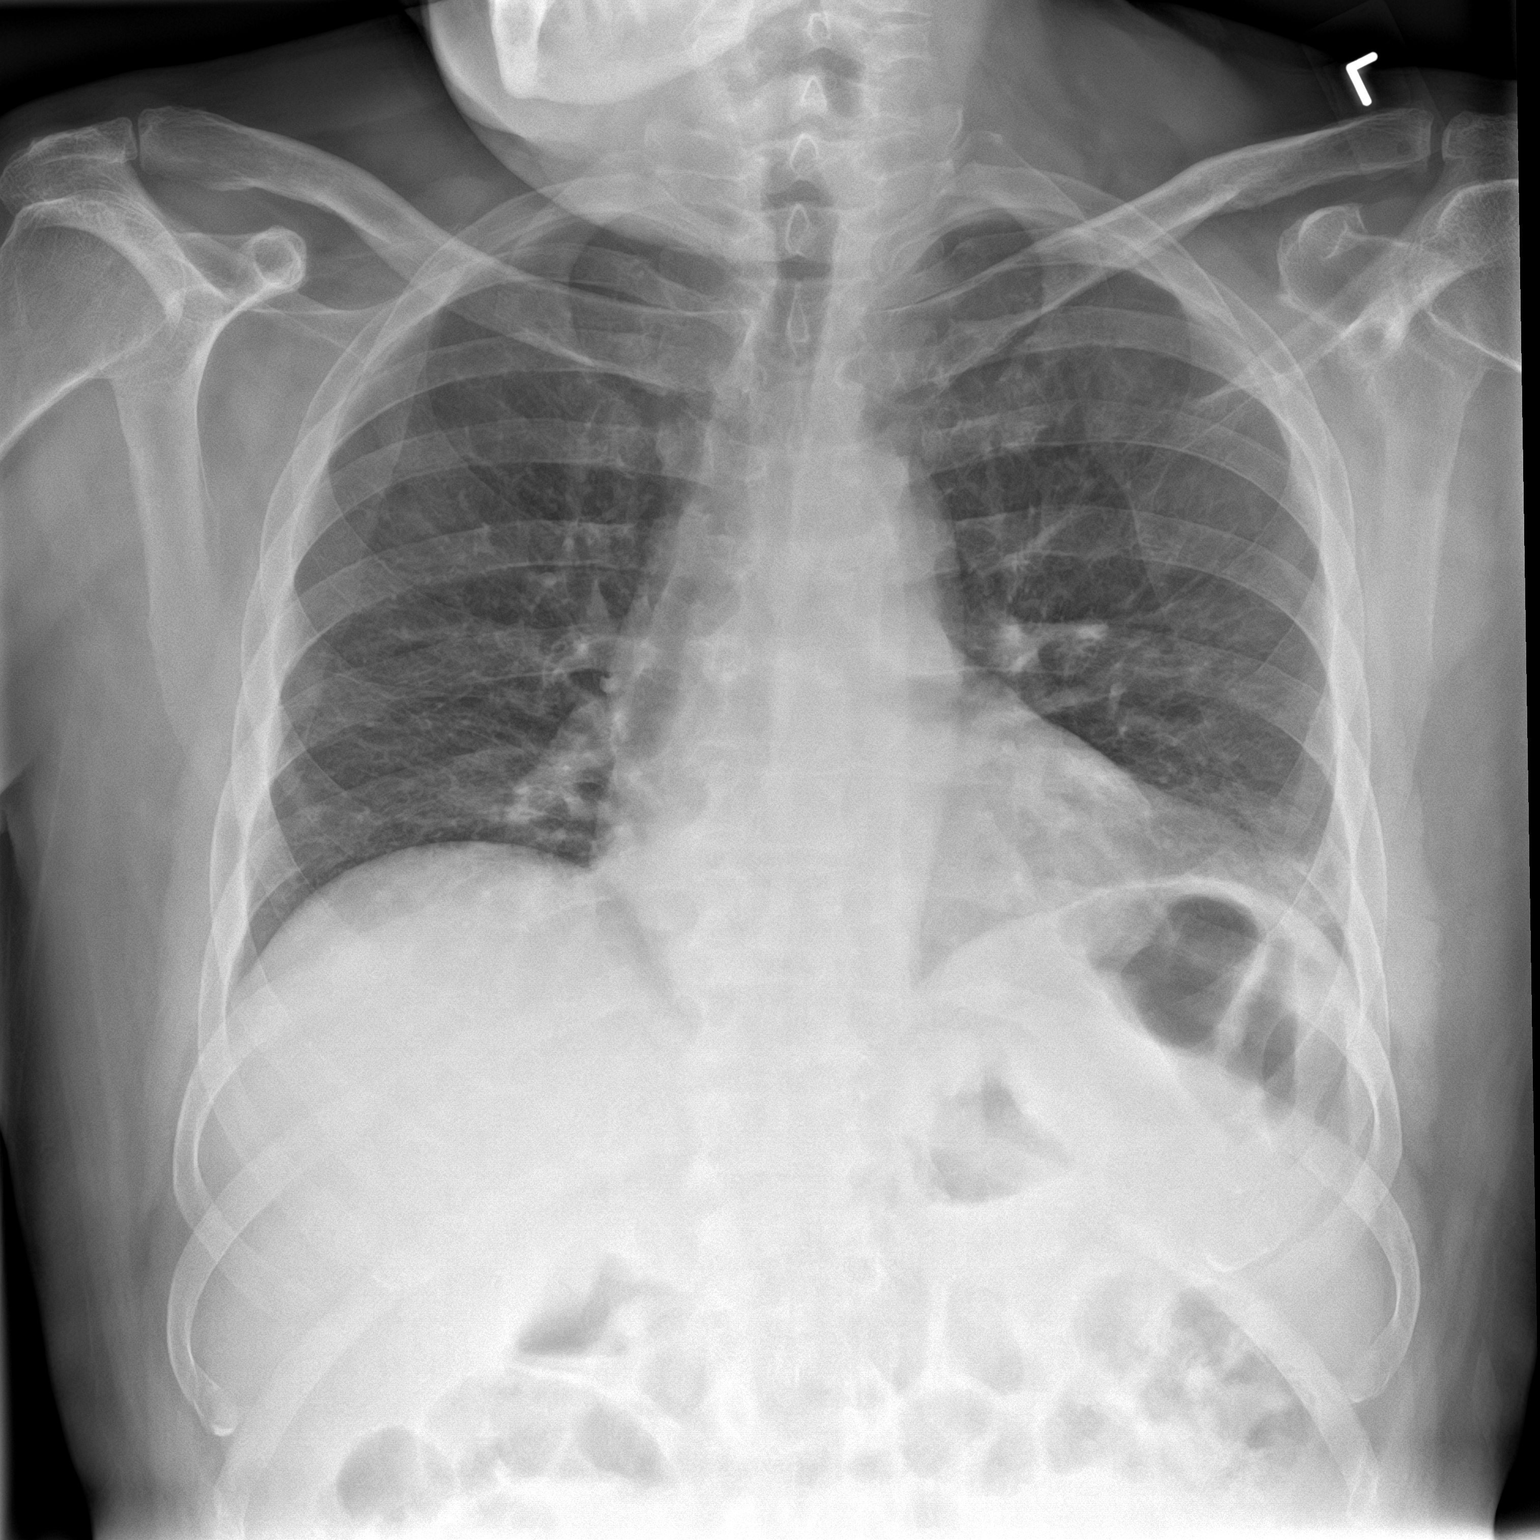

[chest lat]
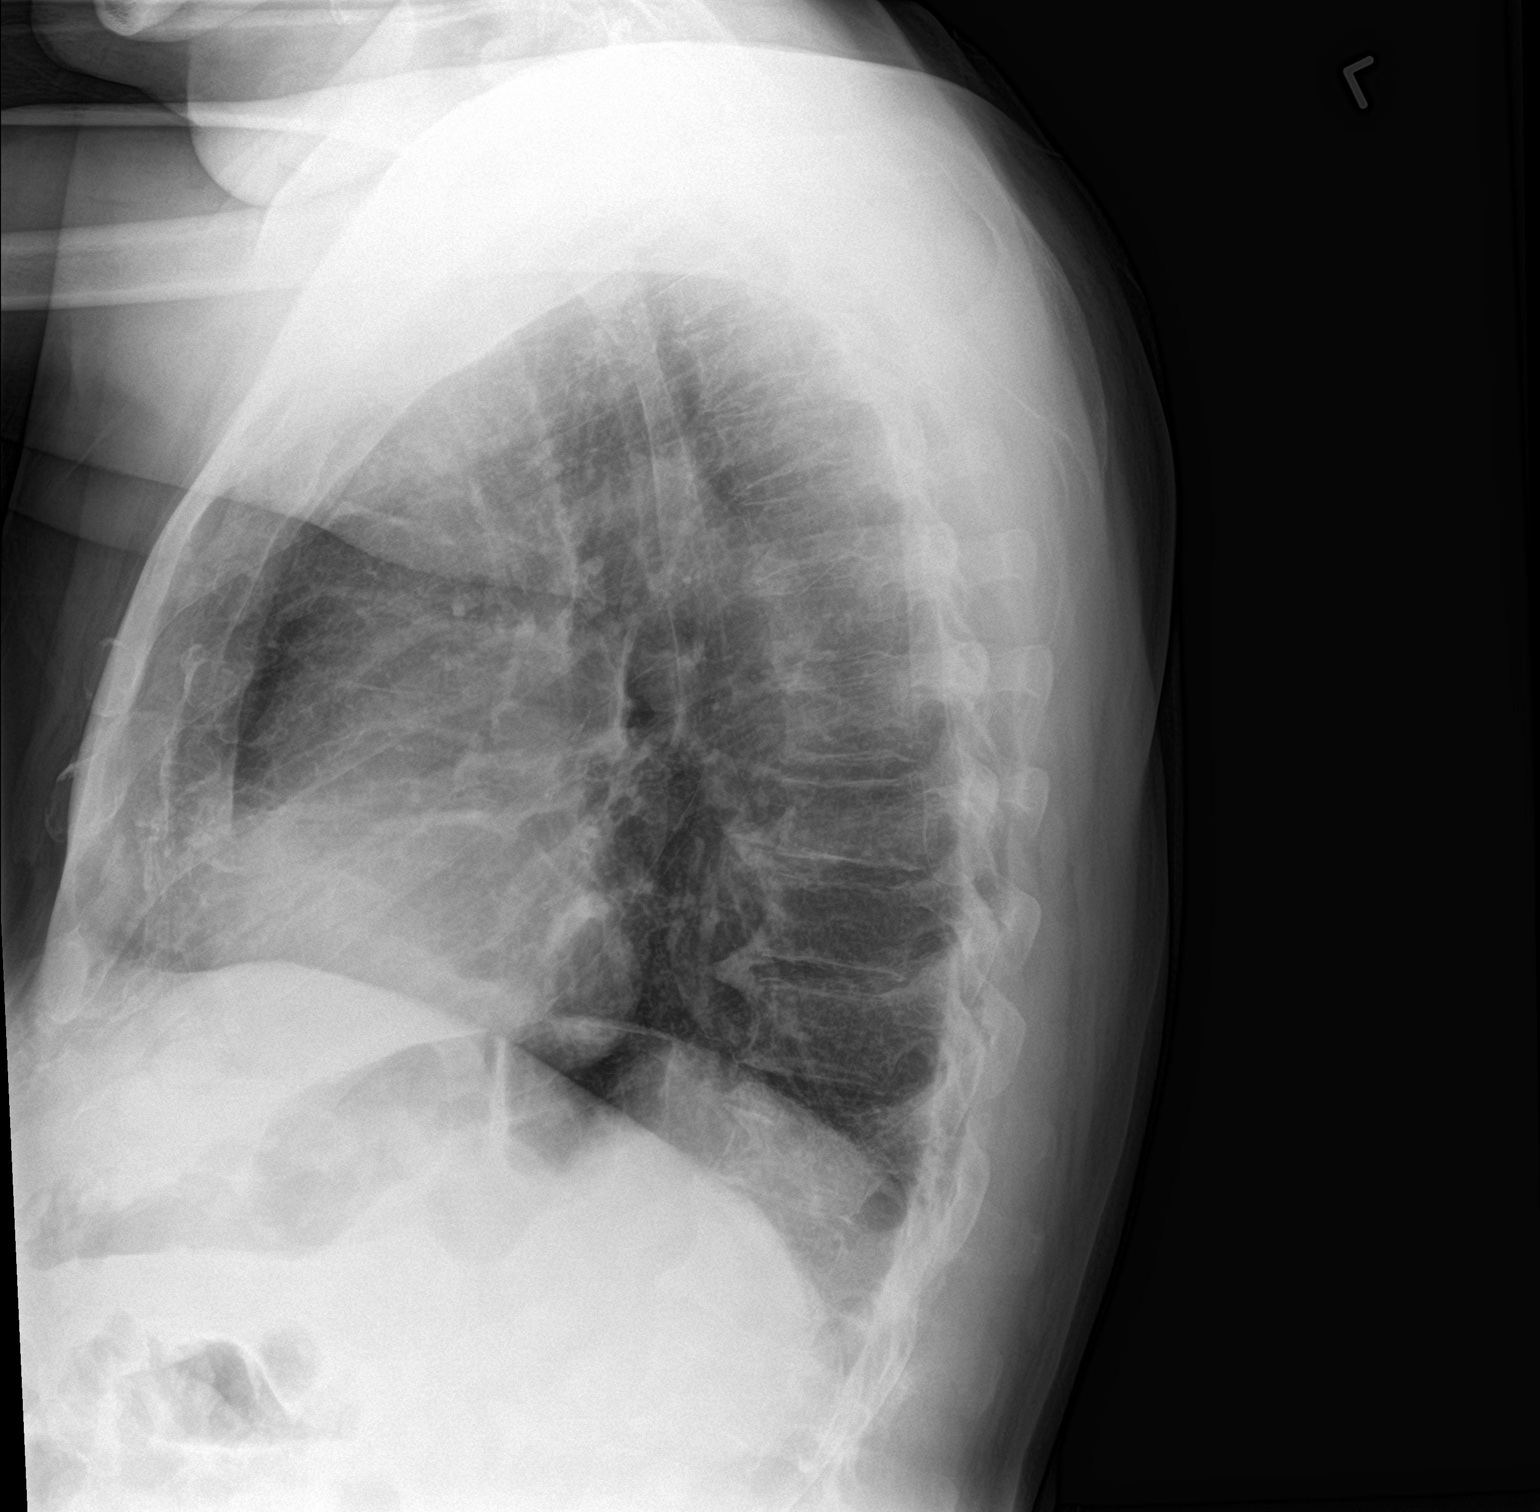

[2 of 2 positions shown; findings below may reference images not displayed]

FINDINGS: Cardiac shadow is stable. The lungs are well aerated bilaterally.
Minimal left basilar atelectasis is seen. No sizable effusion or
confluent infiltrate is noted. Degenerative changes of the thoracic
spine are seen.
IMPRESSION: Minimal left basilar atelectasis.

## 2021-02-25 ENCOUNTER — Emergency Department
Admission: EM | Admit: 2021-02-25 | Discharge: 2021-02-25 | Disposition: A | Discharge status: Home / Self Care | Payer: Medicaid Other | Attending: Emergency Medicine | Admitting: Emergency Medicine

## 2021-02-25 ENCOUNTER — Other Ambulatory Visit: Payer: Self-pay

## 2021-02-25 DIAGNOSIS — Z87891 Personal history of nicotine dependence: Secondary | ICD-10-CM | POA: Insufficient documentation

## 2021-02-25 DIAGNOSIS — R4182 Altered mental status, unspecified: Secondary | ICD-10-CM | POA: Insufficient documentation

## 2021-02-25 DIAGNOSIS — F10920 Alcohol use, unspecified with intoxication, uncomplicated: Secondary | ICD-10-CM

## 2021-02-25 DIAGNOSIS — I10 Essential (primary) hypertension: Secondary | ICD-10-CM | POA: Insufficient documentation

## 2021-02-25 DIAGNOSIS — Z79899 Other long term (current) drug therapy: Secondary | ICD-10-CM | POA: Insufficient documentation

## 2021-02-25 DIAGNOSIS — F10129 Alcohol abuse with intoxication, unspecified: Secondary | ICD-10-CM | POA: Insufficient documentation

## 2021-02-25 DIAGNOSIS — F1010 Alcohol abuse, uncomplicated: Secondary | ICD-10-CM

## 2021-02-25 DIAGNOSIS — J45909 Unspecified asthma, uncomplicated: Secondary | ICD-10-CM | POA: Insufficient documentation

## 2021-02-25 DIAGNOSIS — Y908 Blood alcohol level of 240 mg/100 ml or more: Secondary | ICD-10-CM | POA: Insufficient documentation

## 2021-02-25 LAB — URINE DRUG SCREEN, QUALITATIVE (ARMC ONLY)
Amphetamines, Ur Screen: NOT DETECTED
Barbiturates, Ur Screen: NOT DETECTED
Benzodiazepine, Ur Scrn: POSITIVE — AB
Cannabinoid 50 Ng, Ur ~~LOC~~: POSITIVE — AB
Cocaine Metabolite,Ur ~~LOC~~: NOT DETECTED
MDMA (Ecstasy)Ur Screen: NOT DETECTED
Methadone Scn, Ur: NOT DETECTED
Opiate, Ur Screen: NOT DETECTED
Phencyclidine (PCP) Ur S: NOT DETECTED
Tricyclic, Ur Screen: NOT DETECTED

## 2021-02-25 LAB — CBC
HCT: 37.9 % — ABNORMAL LOW (ref 39.0–52.0)
Hemoglobin: 12.1 g/dL — ABNORMAL LOW (ref 13.0–17.0)
MCH: 26.9 pg (ref 26.0–34.0)
MCHC: 31.9 g/dL (ref 30.0–36.0)
MCV: 84.2 fL (ref 80.0–100.0)
Platelets: 273 10*3/uL (ref 150–400)
RBC: 4.5 MIL/uL (ref 4.22–5.81)
RDW: 17.2 % — ABNORMAL HIGH (ref 11.5–15.5)
WBC: 7.1 10*3/uL (ref 4.0–10.5)
nRBC: 0 % (ref 0.0–0.2)

## 2021-02-25 LAB — COMPREHENSIVE METABOLIC PANEL
ALT: 10 U/L (ref 0–44)
AST: 12 U/L — ABNORMAL LOW (ref 15–41)
Albumin: 3.8 g/dL (ref 3.5–5.0)
Alkaline Phosphatase: 60 U/L (ref 38–126)
Anion gap: 8 (ref 5–15)
BUN: 18 mg/dL (ref 8–23)
CO2: 22 mmol/L (ref 22–32)
Calcium: 8.9 mg/dL (ref 8.9–10.3)
Chloride: 113 mmol/L — ABNORMAL HIGH (ref 98–111)
Creatinine, Ser: 1.1 mg/dL (ref 0.61–1.24)
GFR, Estimated: >60 mL/min (ref >60)
Glucose, Bld: 106 mg/dL — ABNORMAL HIGH (ref 70–99)
Potassium: 4.3 mmol/L (ref 3.5–5.1)
Sodium: 143 mmol/L (ref 135–145)
Total Bilirubin: 0.2 mg/dL — ABNORMAL LOW (ref 0.3–1.2)
Total Protein: 6.8 g/dL (ref 6.5–8.1)

## 2021-02-25 LAB — ETHANOL: Alcohol, Ethyl (B): 336 mg/dL (ref <10)

## 2021-02-25 MED ORDER — CHLORDIAZEPOXIDE HCL 25 MG PO CAPS: ORAL_CAPSULE | ORAL | 0 refills | Status: AC
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
## 2021-02-25 NOTE — ED Notes (Signed)
This RN attempted to contact [employer] for patient ride home with no answer. This RN will attempt to reach out again shortly.

## 2021-02-25 NOTE — ED Provider Notes (Signed)
[facility] Emergency Department Provider Note   ____________________________________________   Event Date/Time   First MD Initiated Contact with Patient 02/25/21 1622     (approximate)  I have reviewed the triage vital signs and the nursing notes.   HISTORY  Chief Complaint Alcohol Problem    HPI KIANDRE SPAGNOLO is a 60 y.o. male with a past medical history of significant alcohol abuse who presents for alcohol intoxication today.  Patient states that he has not used any alcohol in the last 2 weeks until today when he had 3 bottles of wine.  Patient states that he has had increasing stressors including believing that someone stole a piece of equipment from him.  Patient also asking for an unknown prescription that he states he was given last time he was here that helped him with drinking.  Patient only complains of generalized weakness and feeling that he is going to go to sleep.  Patient currently denies any vision changes, tinnitus, difficulty speaking, facial droop, sore throat, chest pain, shortness of breath, abdominal pain, nausea/vomiting/diarrhea, dysuria, or weakness/numbness/paresthesias in any extremity         Past Medical History:  Diagnosis Date  . Alcohol abuse   . Anxiety   . Asthma   . Family history of breast cancer   . GERD (gastroesophageal reflux disease)   . Gout   . Hx of colonic polyps   . Hypertension   . Kidney stone   . 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 OCD (obsessive compulsive disorder)   . Renal colic     Patient Active Problem List   Diagnosis Date Noted  . Hypotension 11/28/2020  . Genetic testing 07/15/2020  . 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 Right ankle pain 06/26/2020  . Family history of breast cancer   . Hx of colonic polyps   . Hospital discharge follow-up 06/05/2020  . Kidney function test abnormal 06/05/2020  . Abdominal pain 04/12/2020  . Hypertensive urgency  04/12/2020  . Alcohol withdrawal ([place]) 03/04/2020  . Right-sided chest wall pain 02/28/2020  . Alcohol abuse with alcohol-induced mood disorder ([place]) 12/02/2019  . Moderate episode of recurrent major depressive disorder ([place])   . Health care maintenance 10/26/2019  . Right knee pain 10/26/2019  . Generalized anxiety disorder 10/14/2019  . MDD (major depressive disorder), recurrent severe, without psychosis ([place]) 07/27/2019  . Social anxiety disorder 07/27/2019  . Left-sided chest wall pain 06/14/2019  . Alcoholic cirrhosis of liver without ascites ([place]) 06/14/2019  . Alcohol abuse 06/14/2019  . AKI (acute kidney injury) ([place]) 05/27/2019  . Alcohol withdrawal syndrome without complication ([place]) 81/19/1478  . Alcoholic intoxication without complication ([place])   . Sepsis ([place]) 03/08/2019  . Breast lump or mass 10/04/2018  . Sepsis secondary to UTI ([place]) 09/14/2018  . Asthma 07/07/2018  . GERD (gastroesophageal reflux disease) 07/07/2018  . OCD (obsessive compulsive disorder) 07/07/2018  . Self-inflicted laceration of left wrist ([place]) 03/10/2018  . Leg hematoma 12/25/2017  . Suicide and self-inflicted injury by cutting and piercing instrument ([place]) 03/10/2017  . Severe recurrent major 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 ([place]) 03/09/2017  . Substance induced mood disorder ([place]) 08/15/2016  . Involuntary commitment 08/15/2016  . Alcohol use disorder, severe, dependence ([place]) 02/05/2016  . Hypertension 12/05/2015  . Tachycardia 12/05/2015  . Gout 11/13/2015  . Chronic back pain 05/02/2015    Past Surgical History:  Procedure Laterality Date  . CHOLECYSTECTOMY  2012  . COLONOSCOPY    . COLONOSCOPY WITH PROPOFOL N/A 05/28/2020   Procedure: COLONOSCOPY WITH PROPOFOL;  Surgeon: [name], MD;  Location: [facility] ENDOSCOPY;  Service: Gastroenterology;  Laterality: N/A;  . EXTRACORPOREAL SHOCK WAVE LITHOTRIPSY Left 01/12/2019   Procedure: EXTRACORPOREAL SHOCK WAVE LITHOTRIPSY (ESWL);   Surgeon: Billey Co, MD;  Location: [facility] ORS;  Service: Urology;  Laterality: Left;  . UPPER GI ENDOSCOPY      Prior to Admission medications   Medication Sig Start Date End Date Taking? Authorizing Provider  chlordiazePOXIDE (LIBRIUM) 25 MG capsule Take 2 capsules (50 mg total) by mouth 4 (four) times daily for 1 day, THEN 1 capsule (25 mg total) 4 (four) times daily for 1 day, THEN 1 capsule (25 mg total) 2 (two) times daily for 1 day, THEN 1 capsule (25 mg total) at bedtime for 1 day. 02/25/21 03/01/21 Yes Lynnsie Linders, Vista Lawman, MD  ADVAIR DISKUS 100-50 MCG/DOSE AEPB INHALE 1 PUFF 2 TIMES A DAY. RINSE MOUTH AND SPIT AFTER EACH USE. 06/12/20 06/12/21  Tawni Millers, MD  allopurinol (ZYLOPRIM) 300 MG tablet TAKE ONE TABLET BY MOUTH EVERY DAY 09/18/20 04/06/21  Iloabachie, Chioma E, NP  ascorbic acid (VITAMIN C) 500 MG tablet Take by mouth. 08/01/19   [provider]  chlordiazePOXIDE (LIBRIUM) 5 MG capsule Take 6 caps (30 mg) 4x daily on day 1. Take 5 caps (25 mg) 4x daily on day 2. Take 4 caps (20 mg) 4x daily on day 3. Take 3 caps (15 mg) 4x daily on day 4. Take 2 caps (10 mg) 4x daily on day 5. Take 2 caps (10 mg) 3x daily on day 6. Take 1 cap (5 mg) 3x daily on day 7. Take 1 cap (5 mg) twice daily on day 8. Take 1 cap (5 mg) in the evening on day 9. 01/18/21   Hinda Kehr, MD  citalopram (CELEXA) 20 MG tablet Take 1 tablet (20 mg total) by mouth daily. 03/21/20 03/21/21  Clapacs, Madie Reno, MD  fexofenadine-pseudoephedrine (ALLEGRA-D) 60-120 MG 12 hr tablet Take 1 tablet by mouth 2 (two) times daily. 12/31/20   Sable Feil, PA-C  Fluticasone-Salmeterol (ADVAIR DISKUS) 100-50 MCG/DOSE AEPB Inhale 1 puff into the lungs 2 (two) times daily. 03/21/20   Clapacs, Madie Reno, MD  folic acid (FOLVITE) 1 MG tablet TAKE ONE TABLET BY MOUTH EVERY DAY 01/07/21 01/07/22  Iloabachie, Chioma E, NP  lisinopril (ZESTRIL) 40 MG tablet TAKE ONE TABLET BY MOUTH EVERY DAY 09/18/20 09/18/21  Iloabachie, Chioma E, NP  metoprolol  tartrate (LOPRESSOR) 25 MG tablet Take 1 tablet (25 mg total) by mouth daily. 11/30/20 12/30/20  Wyvonnia Dusky, MD  pantoprazole (PROTONIX) 40 MG tablet TAKE 1 TABLET (40 MG TOTAL) BY MOUTH DAILY. 10/15/20 10/15/21  Iloabachie, Chioma E, NP  sulfamethoxazole-trimethoprim (BACTRIM DS) 800-160 MG tablet TAKE ONE TABLET BY MOUTH 2 TIMES A DAY 12/31/20 12/31/21  Sable Feil, PA-C  thiamine (VITAMIN B-1) 100 MG tablet TAKE ONE TABLET BY MOUTH EVERY DAY 10/22/20 10/22/21  Iloabachie, Chioma E, NP  thiamine 100 MG tablet Take 1 tablet (100 mg total) by mouth daily. 10/22/20   Iloabachie, Chioma E, NP  VENTOLIN HFA 108 (90 Base) MCG/ACT inhaler INHALE 2 PUFFS EVERY 4 HOURS AS NEEDED 10/22/20   Iloabachie, Chioma E, NP  VENTOLIN HFA 108 (90 Base) MCG/ACT inhaler INHALE 2 PUFFS EVERY 4 HOURS AS NEEDED 10/22/20 10/22/21  Tawni Millers, MD    Allergies Percocet [oxycodone-acetaminophen]  Family  History  Problem Relation Age of Onset  . Alcohol abuse Father   . Breast cancer Mother 52    Social History Social History   Tobacco Use  . Smoking status: Former Smoker    Types: Cigarettes  . Smokeless tobacco: Never Used  . Tobacco comment: quit 30 years ago  Vaping Use  . Vaping Use: Never used  Substance Use Topics  . Alcohol use: Yes    Alcohol/week: 12.0 standard drinks    Types: 12 Shots of liquor per week  . Drug use: No    Review of Systems Constitutional: No fever/chills Eyes: No visual changes. ENT: No sore throat. Cardiovascular: Denies chest pain. Respiratory: Denies shortness of breath. Gastrointestinal: No abdominal pain.  No nausea, no vomiting.  No diarrhea. Genitourinary: Negative for dysuria. Musculoskeletal: Negative for acute arthralgias Skin: Negative for rash. Neurological: Negative for headaches, weakness/numbness/paresthesias in any extremity Psychiatric: Negative for suicidal ideation/homicidal  ideation   ____________________________________________   PHYSICAL EXAM:  VITAL SIGNS: ED Triage Vitals [02/25/21 1613]  Enc Vitals Group     BP 133/87     Pulse Rate 86     Resp 16     Temp 97.8 F (36.6 C)     Temp Source Oral     SpO2 96 %     Weight 175 lb (79.4 kg)     Height 6' (1.829 m)     Head Circumference      Peak Flow      Pain Score 0     Pain Loc      Pain Edu?      Excl. in [place]?    Constitutional: Alert and oriented. Well appearing and in no acute distress. Eyes: Conjunctivae are normal. PERRL. Head: Atraumatic. Nose: No congestion/rhinnorhea. Mouth/Throat: Mucous membranes are moist. Neck: No stridor Cardiovascular: Grossly normal heart sounds.  Good peripheral circulation. Respiratory: Normal respiratory effort.  No retractions. Gastrointestinal: Soft and nontender. No distention. Musculoskeletal: No obvious deformities Neurologic:  Normal speech and language. No gross focal neurologic deficits are appreciated. Skin:  Skin is warm and dry. No rash noted. Psychiatric: Cooperative.  Labile mood  ____________________________________________   LABS (all labs ordered are listed, but only abnormal results are displayed)  Labs Reviewed  COMPREHENSIVE METABOLIC PANEL - Abnormal; Notable for the following components:      Result Value   Chloride 113 (*)    Glucose, Bld 106 (*)    AST 12 (*)    Total Bilirubin 0.2 (*)    All other components within normal limits  ETHANOL - Abnormal; Notable for the following components:   Alcohol, Ethyl (B) 336 (*)    All other components within normal limits  CBC - Abnormal; Notable for the following components:   Hemoglobin 12.1 (*)    HCT 37.9 (*)    RDW 17.2 (*)    All other components within normal limits  URINE DRUG SCREEN, QUALITATIVE ([facility] ONLY) - Abnormal; Notable for the following components:   Cannabinoid 50 Ng, Ur [place] POSITIVE (*)    Benzodiazepine, Ur Scrn POSITIVE (*)    All other components within  normal limits   ____________________________________________  PROCEDURES  Procedure(s) performed (including Critical Care):  Procedures   ____________________________________________   INITIAL IMPRESSION / ASSESSMENT AND PLAN / ED COURSE  As part of my medical decision making, I reviewed the following data within the [facility] notes reviewed and incorporated, Old chart reviewed, and Notes from prior ED visits reviewed  and incorporated       Presents with altered mental status. +Slurred, sluggish behavior. Stated EtOH intoxication. Airway maintained. Unlikely intracranial bleed, opioid intoxication or coingestion, sepsis, hypothyroidism. Suspect likely transient course of intoxication with expected  improvement of symptoms as patient metabolizes offending agent.  Plan: frequent reassessments  Reassessment Note: Time: 2 hours since initial presentation. Evaluation: Frequent mental status exams showed improving symptoms and evidence that the patients AMS was secondary to intoxication. Pt able to ambulate without difficulty and PO tolerant. Plan DC home with ride and return precautions. Disposition: Discharge home        ____________________________________________   FINAL CLINICAL IMPRESSION(S) / ED DIAGNOSES  Final diagnoses:  Alcoholic intoxication without complication ([place])  Alcohol abuse     ED Discharge Orders         Ordered    chlordiazePOXIDE (LIBRIUM) 25 MG capsule        02/25/21 1818           Note:  This document was prepared using Dragon voice recognition software and may include unintentional dictation errors.   Naaman Plummer, MD 02/25/21 2225

## 2021-02-25 NOTE — ED Triage Notes (Signed)
Pt via EMS from home. Pt states he feels like he is going to pass out. Pt is a chronic alcohol abuser. Pt had 3 bottles of wine today. Pt is A&Ox4 and NAD.

## 2021-02-25 NOTE — ED Notes (Signed)
Pt denies SI/HI, AH/VH. Pt here c/o dizziness after drinking approximately 3 bottles of wine. Pt denies pain at this time. Pt able to ambulate steadily to the bathroom with no assistance. Pt also wishes to receive information about referrals for helping him stop drinking alcohol.

## 2021-02-25 NOTE — ED Notes (Signed)
This RN attempted to contact [facility] x[facility] with no answer.

## 2021-02-27 ENCOUNTER — Telehealth: Payer: Self-pay | Admitting: Pharmacist

## 2021-02-27 ENCOUNTER — Other Ambulatory Visit: Payer: Self-pay

## 2021-02-27 ENCOUNTER — Emergency Department
Admission: EM | Admit: 2021-02-27 | Discharge: 2021-02-28 | Disposition: A | Discharge status: Home / Self Care | Payer: Medicaid Other | Attending: Emergency Medicine | Admitting: Emergency Medicine

## 2021-02-27 DIAGNOSIS — R1013 Epigastric pain: Secondary | ICD-10-CM

## 2021-02-27 DIAGNOSIS — K219 Gastro-esophageal reflux disease without esophagitis: Secondary | ICD-10-CM | POA: Insufficient documentation

## 2021-02-27 DIAGNOSIS — F10920 Alcohol use, unspecified with intoxication, uncomplicated: Secondary | ICD-10-CM

## 2021-02-27 DIAGNOSIS — J45909 Unspecified asthma, uncomplicated: Secondary | ICD-10-CM | POA: Insufficient documentation

## 2021-02-27 DIAGNOSIS — Z87891 Personal history of nicotine dependence: Secondary | ICD-10-CM | POA: Insufficient documentation

## 2021-02-27 DIAGNOSIS — Z7951 Long term (current) use of inhaled steroids: Secondary | ICD-10-CM | POA: Insufficient documentation

## 2021-02-27 DIAGNOSIS — F10129 Alcohol abuse with intoxication, unspecified: Secondary | ICD-10-CM | POA: Insufficient documentation

## 2021-02-27 DIAGNOSIS — Z79899 Other long term (current) drug therapy: Secondary | ICD-10-CM | POA: Insufficient documentation

## 2021-02-27 DIAGNOSIS — R112 Nausea with vomiting, unspecified: Secondary | ICD-10-CM

## 2021-02-27 DIAGNOSIS — I10 Essential (primary) hypertension: Secondary | ICD-10-CM | POA: Insufficient documentation

## 2021-02-27 DIAGNOSIS — K2920 Alcoholic gastritis without bleeding: Secondary | ICD-10-CM | POA: Insufficient documentation

## 2021-02-27 DIAGNOSIS — R0981 Nasal congestion: Secondary | ICD-10-CM | POA: Insufficient documentation

## 2021-02-27 LAB — CBC WITH DIFFERENTIAL/PLATELET
Abs Immature Granulocytes: 0.04 10*3/uL (ref 0.00–0.07)
Basophils Absolute: 0.1 10*3/uL (ref 0.0–0.1)
Basophils Relative: 1 %
Eosinophils Absolute: 0.1 10*3/uL (ref 0.0–0.5)
Eosinophils Relative: 1 %
HCT: 41.4 % (ref 39.0–52.0)
Hemoglobin: 13.7 g/dL (ref 13.0–17.0)
Immature Granulocytes: 0 %
Lymphocytes Relative: 26 %
Lymphs Abs: 2.5 10*3/uL (ref 0.7–4.0)
MCH: 27.2 pg (ref 26.0–34.0)
MCHC: 33.1 g/dL (ref 30.0–36.0)
MCV: 82.3 fL (ref 80.0–100.0)
Monocytes Absolute: 0.7 10*3/uL (ref 0.1–1.0)
Monocytes Relative: 7 %
Neutro Abs: 6.2 10*3/uL (ref 1.7–7.7)
Neutrophils Relative %: 65 %
Platelets: 289 10*3/uL (ref 150–400)
RBC: 5.03 MIL/uL (ref 4.22–5.81)
RDW: 17.2 % — ABNORMAL HIGH (ref 11.5–15.5)
WBC: 9.6 10*3/uL (ref 4.0–10.5)
nRBC: 0 % (ref 0.0–0.2)

## 2021-02-27 LAB — BASIC METABOLIC PANEL
Anion gap: 18 — ABNORMAL HIGH (ref 5–15)
BUN: 16 mg/dL (ref 8–23)
CO2: 17 mmol/L — ABNORMAL LOW (ref 22–32)
Calcium: 9 mg/dL (ref 8.9–10.3)
Chloride: 101 mmol/L (ref 98–111)
Creatinine, Ser: 1.11 mg/dL (ref 0.61–1.24)
GFR, Estimated: >60 mL/min (ref >60)
Glucose, Bld: 93 mg/dL (ref 70–99)
Potassium: 3.7 mmol/L (ref 3.5–5.1)
Sodium: 136 mmol/L (ref 135–145)

## 2021-02-27 LAB — LIPASE, BLOOD: Lipase: 29 U/L (ref 11–51)

## 2021-02-27 MED ORDER — SODIUM CHLORIDE 0.9 % IV BOLUS
1000.0000 mL | Freq: Once | INTRAVENOUS | Status: AC
  Administered 2021-02-28: 1000 mL via INTRAVENOUS

## 2021-02-27 MED ORDER — ONDANSETRON HCL 4 MG/2ML IJ SOLN
4.0000 mg | Freq: Once | INTRAMUSCULAR | Status: AC
  Administered 2021-02-28: 4 mg via INTRAVENOUS
  Filled 2021-02-27: qty 2

## 2021-02-27 MED ORDER — LORAZEPAM 2 MG/ML IJ SOLN: 0.0000 mg | Freq: Four times a day (QID) | INTRAMUSCULAR | Status: DC

## 2021-02-27 MED ORDER — LORAZEPAM 2 MG/ML IJ SOLN
2.0000 mg | Freq: Once | INTRAMUSCULAR | Status: AC
  Administered 2021-02-28: 2 mg via INTRAVENOUS
  Filled 2021-02-27: qty 1

## 2021-02-27 MED ORDER — FAMOTIDINE IN NACL 20-0.9 MG/50ML-% IV SOLN
20.0000 mg | Freq: Once | INTRAVENOUS | Status: AC
  Administered 2021-02-28: 20 mg via INTRAVENOUS
  Filled 2021-02-27: qty 50

## 2021-02-27 MED ORDER — THIAMINE HCL 100 MG PO TABS: 100.0000 mg | ORAL_TABLET | Freq: Every day | ORAL | Status: DC

## 2021-02-27 NOTE — Telephone Encounter (Signed)
02/27/2021 12:40:17 PM - Advair refill online with [facility] - Thursday, February 27, 2021 12:39 PM --Refilled Advair online with [facility], order# [id number], allow 10-14 business days to receive.

## 2021-02-27 NOTE — ED Notes (Signed)
ED Provider at bedside. 

## 2021-02-27 NOTE — ED Notes (Signed)
Patient reports nausea, vomiting, abdominal pain, and constipation x 2 days. Patient reports BUE tremors began today. Patient estimates last alcohol consumption was yesterday. Patient reports wanting to quit drinking. Patient tearful.

## 2021-02-27 NOTE — ED Notes (Signed)
Pt calling out for nurse, wanting to lay down.  Nurse gave patient blanket and now sitting in cushion chair in lobby with feet propped up.

## 2021-02-27 NOTE — ED Triage Notes (Signed)
Pt states aprox 3 hours ago his stomach started hurting and he has been vomiting pt states he has been having diarrhea also. Pt has had 2 bottles of wine tonight, pt states he hasnt been able to eat for the past 2 days.

## 2021-02-28 LAB — URINALYSIS, ROUTINE W REFLEX MICROSCOPIC
Bilirubin Urine: NEGATIVE
Glucose, UA: NEGATIVE mg/dL
Hgb urine dipstick: NEGATIVE
Ketones, ur: 20 mg/dL — AB
Leukocytes,Ua: NEGATIVE
Nitrite: NEGATIVE
Protein, ur: NEGATIVE mg/dL
Specific Gravity, Urine: 1.014 (ref 1.005–1.030)
pH: 5 (ref 5.0–8.0)

## 2021-02-28 LAB — HEPATIC FUNCTION PANEL
ALT: 14 U/L (ref 0–44)
AST: 19 U/L (ref 15–41)
Albumin: 4.2 g/dL (ref 3.5–5.0)
Alkaline Phosphatase: 71 U/L (ref 38–126)
Bilirubin, Direct: 0.1 mg/dL (ref 0.0–0.2)
Indirect Bilirubin: 0.6 mg/dL (ref 0.3–0.9)
Total Bilirubin: 0.7 mg/dL (ref 0.3–1.2)
Total Protein: 7.3 g/dL (ref 6.5–8.1)

## 2021-02-28 LAB — ETHANOL: Alcohol, Ethyl (B): 178 mg/dL — ABNORMAL HIGH (ref <10)

## 2021-02-28 MED ORDER — LORAZEPAM 1 MG PO TABS: 1.0000 mg | ORAL_TABLET | Freq: Three times a day (TID) | ORAL | 0 refills | Status: DC | PRN

## 2021-02-28 MED ORDER — FLUTICASONE PROPIONATE 50 MCG/ACT NA SUSP
1.0000 | Freq: Once | NASAL | Status: AC
  Administered 2021-02-28: 1 via NASAL
  Filled 2021-02-28: qty 16

## 2021-02-28 NOTE — ED Provider Notes (Signed)
[facility] Emergency Department Provider Note   ____________________________________________   Event Date/Time   First MD Initiated Contact with Patient 02/27/21 2316     (approximate)  I have reviewed the triage vital signs and the nursing notes.   HISTORY  Chief Complaint Abdominal Pain    HPI Miguel [name] is a 60 y.o. male who presents to the ED from home with a chief complaint of epigastric abdominal pain, nausea, vomiting and constipation x2 days.  Patient resumed drinking alcohol heavily recently.  Admits to drinking 2 bottles of wine.  Presents with tremors.  Denies SI/HI.  Denies fever, cough, chest pain, shortness of breath, dysuria or diarrhea.  Denies vomiting blood or bloody stools     Past Medical History:  Diagnosis Date  . Alcohol abuse   . Anxiety   . Asthma   . Family history of breast cancer   . GERD (gastroesophageal reflux disease)   . Gout   . Hx of colonic polyps   . Hypertension   . Kidney stone   . 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 OCD (obsessive compulsive disorder)   . Renal colic     Patient Active Problem List   Diagnosis Date Noted  . Hypotension 11/28/2020  . Genetic testing 07/15/2020  . 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 Right ankle pain 06/26/2020  . Family history of breast cancer   . Hx of colonic polyps   . Hospital discharge follow-up 06/05/2020  . Kidney function test abnormal 06/05/2020  . Abdominal pain 04/12/2020  . Hypertensive urgency 04/12/2020  . Alcohol withdrawal ([place]) 03/04/2020  . Right-sided chest wall pain 02/28/2020  . Alcohol abuse with alcohol-induced mood disorder ([place]) 12/02/2019  . Moderate episode of recurrent major depressive disorder ([place])   . Health care maintenance 10/26/2019  . Right knee pain 10/26/2019  . Generalized anxiety disorder 10/14/2019  . MDD (major depressive disorder), recurrent severe, without psychosis ([place])  07/27/2019  . Social anxiety disorder 07/27/2019  . Left-sided chest wall pain 06/14/2019  . Alcoholic cirrhosis of liver without ascites ([place]) 06/14/2019  . Alcohol abuse 06/14/2019  . AKI (acute kidney injury) ([place]) 05/27/2019  . Alcohol withdrawal syndrome without complication ([place]) 19/75/8832  . Alcoholic intoxication without complication ([place])   . Sepsis ([place]) 03/08/2019  . Breast lump or mass 10/04/2018  . Sepsis secondary to UTI ([place]) 09/14/2018  . Asthma 07/07/2018  . GERD (gastroesophageal reflux disease) 07/07/2018  . OCD (obsessive compulsive disorder) 07/07/2018  . Self-inflicted laceration of left wrist ([place]) 03/10/2018  . Leg hematoma 12/25/2017  . Suicide and self-inflicted injury by cutting and piercing instrument ([place]) 03/10/2017  . Severe recurrent major 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 ([place]) 03/09/2017  . Substance induced mood disorder ([place]) 08/15/2016  . Involuntary commitment 08/15/2016  . Alcohol use disorder, severe, dependence ([place]) 02/05/2016  . Hypertension 12/05/2015  . Tachycardia 12/05/2015  . Gout 11/13/2015  . Chronic back pain 05/02/2015    Past Surgical History:  Procedure Laterality Date  . CHOLECYSTECTOMY  2012  . COLONOSCOPY    . COLONOSCOPY WITH PROPOFOL N/A 05/28/2020   Procedure: COLONOSCOPY WITH PROPOFOL;  Surgeon: [name], MD;  Location: [facility] ENDOSCOPY;  Service: Gastroenterology;  Laterality: N/A;  . EXTRACORPOREAL SHOCK WAVE LITHOTRIPSY Left 01/12/2019   Procedure: EXTRACORPOREAL SHOCK WAVE LITHOTRIPSY (ESWL);  Surgeon: Billey Co, MD;  Location: [facility] ORS;  Service: Urology;  Laterality: Left;  . UPPER GI  ENDOSCOPY      Prior to Admission medications   Medication Sig Start Date End Date Taking? Authorizing Provider  LORazepam (ATIVAN) 1 MG tablet Take 1 tablet (1 mg total) by mouth every 8 (eight) hours as needed for anxiety. 02/28/21  Yes Paulette Blanch, MD  ADVAIR DISKUS 100-50 MCG/DOSE AEPB INHALE 1 PUFF 2 TIMES A DAY.  RINSE MOUTH AND SPIT AFTER EACH USE. 06/12/20 06/12/21  Tawni Millers, MD  allopurinol (ZYLOPRIM) 300 MG tablet TAKE ONE TABLET BY MOUTH EVERY DAY 09/18/20 04/06/21  Iloabachie, Chioma E, NP  ascorbic acid (VITAMIN C) 500 MG tablet Take by mouth. 08/01/19   [provider]  chlordiazePOXIDE (LIBRIUM) 25 MG capsule Take 2 capsules (50 mg total) by mouth 4 (four) times daily for 1 day, THEN 1 capsule (25 mg total) 4 (four) times daily for 1 day, THEN 1 capsule (25 mg total) 2 (two) times daily for 1 day, THEN 1 capsule (25 mg total) at bedtime for 1 day. 02/25/21 03/01/21  Naaman Plummer, MD  chlordiazePOXIDE (LIBRIUM) 5 MG capsule Take 6 caps (30 mg) 4x daily on day 1. Take 5 caps (25 mg) 4x daily on day 2. Take 4 caps (20 mg) 4x daily on day 3. Take 3 caps (15 mg) 4x daily on day 4. Take 2 caps (10 mg) 4x daily on day 5. Take 2 caps (10 mg) 3x daily on day 6. Take 1 cap (5 mg) 3x daily on day 7. Take 1 cap (5 mg) twice daily on day 8. Take 1 cap (5 mg) in the evening on day 9. 01/18/21   Hinda Kehr, MD  citalopram (CELEXA) 20 MG tablet Take 1 tablet (20 mg total) by mouth daily. 03/21/20 03/21/21  Clapacs, Madie Reno, MD  fexofenadine-pseudoephedrine (ALLEGRA-D) 60-120 MG 12 hr tablet Take 1 tablet by mouth 2 (two) times daily. 12/31/20   Sable Feil, PA-C  Fluticasone-Salmeterol (ADVAIR DISKUS) 100-50 MCG/DOSE AEPB Inhale 1 puff into the lungs 2 (two) times daily. 03/21/20   Clapacs, Madie Reno, MD  folic acid (FOLVITE) 1 MG tablet TAKE ONE TABLET BY MOUTH EVERY DAY 01/07/21 01/07/22  Iloabachie, Chioma E, NP  lisinopril (ZESTRIL) 40 MG tablet TAKE ONE TABLET BY MOUTH EVERY DAY 09/18/20 09/18/21  Iloabachie, Chioma E, NP  metoprolol tartrate (LOPRESSOR) 25 MG tablet Take 1 tablet (25 mg total) by mouth daily. 11/30/20 12/30/20  Wyvonnia Dusky, MD  pantoprazole (PROTONIX) 40 MG tablet TAKE 1 TABLET (40 MG TOTAL) BY MOUTH DAILY. 10/15/20 10/15/21  Iloabachie, Chioma E, NP  sulfamethoxazole-trimethoprim (BACTRIM  DS) 800-160 MG tablet TAKE ONE TABLET BY MOUTH 2 TIMES A DAY 12/31/20 12/31/21  Sable Feil, PA-C  thiamine (VITAMIN B-1) 100 MG tablet TAKE ONE TABLET BY MOUTH EVERY DAY 10/22/20 10/22/21  Iloabachie, Chioma E, NP  thiamine 100 MG tablet Take 1 tablet (100 mg total) by mouth daily. 10/22/20   Iloabachie, Chioma E, NP  VENTOLIN HFA 108 (90 Base) MCG/ACT inhaler INHALE 2 PUFFS EVERY 4 HOURS AS NEEDED 10/22/20   Iloabachie, Chioma E, NP  VENTOLIN HFA 108 (90 Base) MCG/ACT inhaler INHALE 2 PUFFS EVERY 4 HOURS AS NEEDED 10/22/20 10/22/21  Tawni Millers, MD    Allergies Percocet [oxycodone-acetaminophen]  Family History  Problem Relation Age of Onset  . Alcohol abuse Father   . Breast cancer Mother 84    Social History Social History   Tobacco Use  . Smoking status: Former Smoker    Types: Cigarettes  .  Smokeless tobacco: Never Used  . Tobacco comment: quit 30 years ago  Vaping Use  . Vaping Use: Never used  Substance Use Topics  . Alcohol use: Yes    Alcohol/week: 12.0 standard drinks    Types: 12 Shots of liquor per week  . Drug use: No    Review of Systems  Constitutional: No fever/chills Eyes: No visual changes. ENT: No sore throat. Cardiovascular: Denies chest pain. Respiratory: Denies shortness of breath. Gastrointestinal: Positive for upper abdominal pain, nausea and vomiting.  No diarrhea.  No constipation. Genitourinary: Negative for dysuria. Musculoskeletal: Negative for back pain. Skin: Negative for rash. Neurological: Negative for headaches, focal weakness or numbness.   ____________________________________________   PHYSICAL EXAM:  VITAL SIGNS: ED Triage Vitals  Enc Vitals Group     BP 02/27/21 2015 (!) 154/93     Pulse Rate 02/27/21 2015 (!) 111     Resp 02/27/21 2015 18     Temp 02/27/21 2015 98.7 F (37.1 C)     Temp Source 02/27/21 2015 Oral     SpO2 02/27/21 2015 96 %     Weight 02/27/21 2013 175 lb (79.4 kg)     Height 02/27/21 2013 6' (1.829  m)     Head Circumference --      Peak Flow --      Pain Score 02/27/21 2013 10     Pain Loc --      Pain Edu? --      Excl. in [place]? --     Constitutional: Alert and oriented. Well appearing and in mild acute distress. Eyes: Conjunctivae are normal. PERRL. EOMI. Head: Atraumatic. Nose: No congestion/rhinnorhea. Mouth/Throat: Mucous membranes are mildly dry. Neck: No stridor.   Cardiovascular: Tachycardic rate, regular rhythm. Grossly normal heart sounds.  Good peripheral circulation. Respiratory: Normal respiratory effort.  No retractions. Lungs CTAB. Gastrointestinal: Soft and minimally tender to palpation epigastrium without rebound or guard. No distention. No abdominal bruits. No CVA tenderness. Musculoskeletal: No lower extremity tenderness nor edema.  No joint effusions. Neurologic:  Normal speech and language. No gross focal neurologic deficits are appreciated.  Skin:  Skin is warm, dry and intact. No rash noted. Psychiatric: Mood and affect are tearful, tremulous. Speech and behavior are normal.  ____________________________________________   LABS (all labs ordered are listed, but only abnormal results are displayed)  Labs Reviewed  CBC WITH DIFFERENTIAL/PLATELET - Abnormal; Notable for the following components:      Result Value   RDW 17.2 (*)    All other components within normal limits  BASIC METABOLIC PANEL - Abnormal; Notable for the following components:   CO2 17 (*)    Anion gap 18 (*)    All other components within normal limits  URINALYSIS, ROUTINE W REFLEX MICROSCOPIC - Abnormal; Notable for the following components:   Color, Urine YELLOW (*)    APPearance CLEAR (*)    Ketones, ur 20 (*)    All other components within normal limits  ETHANOL - Abnormal; Notable for the following components:   Alcohol, Ethyl (B) 178 (*)    All other components within normal limits  LIPASE, BLOOD  HEPATIC FUNCTION PANEL    ____________________________________________  EKG  None ____________________________________________  RADIOLOGY I, Vernie Vinciguerra J, personally viewed and evaluated these images (plain radiographs) as part of my medical decision making, as well as reviewing the written report by the radiologist.  ED MD interpretation: None  Official radiology report(s): No results found.  ____________________________________________   PROCEDURES  Procedure(s)  performed (including Critical Care):  Procedures   ____________________________________________   INITIAL IMPRESSION / ASSESSMENT AND PLAN / ED COURSE  As part of my medical decision making, I reviewed the following data within the [facility] notes reviewed and incorporated, Labs reviewed, Old chart reviewed and Notes from prior ED visits     60 year old alcoholic presenting with upper abdominal pain, nausea and vomiting. Differential diagnosis includes, but is not limited to, biliary disease (biliary colic, acute cholecystitis, cholangitis, choledocholithiasis, etc), intrathoracic causes for epigastric abdominal pain including ACS, gastritis, duodenitis, pancreatitis, small bowel or large bowel obstruction, abdominal aortic aneurysm, hernia, and ulcer(s).  Laboratory results remarkable for mild AG elevation which is likely metabolic.  Will check LFTs.  Initiate IV fluid resuscitation, CIWA protocol, Pepcid, Zofran.  Will reassess.  Clinical Course as of 02/28/21 0433  Fri Feb 28, 2021  0241 Patient ambulated with steady gait to the restroom.  Feeling better and ready for discharge home.  Will prescribe limited quantity Ativan to help with tremors.  Tolerated liquids without emesis.  Will order Flonase for nasal congestion.  Strict return precautions given.  Patient verbalizes understanding agrees with plan of care. [JS]    Clinical Course User Index [JS] Paulette Blanch, MD      ____________________________________________   FINAL CLINICAL IMPRESSION(S) / ED DIAGNOSES  Final diagnoses:  Epigastric pain  Acute alcoholic gastritis without hemorrhage  Non-intractable vomiting with nausea, unspecified vomiting type  Alcoholic intoxication without complication (HCC)  Nasal congestion     ED Discharge Orders         Ordered    LORazepam (ATIVAN) 1 MG tablet  Every 8 hours PRN        02/28/21 0243          *Please note:  ALDWIN MICALIZZI was evaluated in Emergency Department on 02/28/2021 for the symptoms described in the history of present illness. He was evaluated in the context of the global COVID-19 pandemic, which necessitated consideration that the patient might be at risk for infection with the SARS-CoV-2 virus that causes COVID-19. Institutional protocols and algorithms that pertain to the evaluation of patients at risk for COVID-19 are in a state of rapid change based on information released by regulatory bodies including the CDC and federal and state organizations. These policies and algorithms were followed during the patient's care in the ED.  Some ED evaluations and interventions may be delayed as a result of limited staffing during and the pandemic.*   Note:  This document was prepared using Dragon voice recognition software and may include unintentional dictation errors.   Paulette Blanch, MD 02/28/21 [phone]

## 2021-02-28 NOTE — Discharge Instructions (Addendum)
You may take Ativan as needed for shakes.  Do not take Librium while you are taking Ativan.  You may use Flonase as needed for nasal congestion.  Return to the ER for worsening symptoms, persistent vomiting, difficulty breathing or other concerns

## 2021-02-28 NOTE — ED Notes (Signed)
Patient not in bed when this RN came to check on him. This RN to look in surrounding area, and restroom.

## 2021-02-28 NOTE — ED Notes (Signed)
Patient aware of need for urine sample ?

## 2021-03-05 ENCOUNTER — Emergency Department: Payer: Medicaid Other

## 2021-03-05 ENCOUNTER — Emergency Department
Admission: EM | Admit: 2021-03-05 | Discharge: 2021-03-05 | Disposition: A | Discharge status: Home / Self Care | Payer: Medicaid Other | Attending: Emergency Medicine | Admitting: Emergency Medicine

## 2021-03-05 ENCOUNTER — Other Ambulatory Visit: Payer: Self-pay

## 2021-03-05 DIAGNOSIS — I10 Essential (primary) hypertension: Secondary | ICD-10-CM | POA: Insufficient documentation

## 2021-03-05 DIAGNOSIS — F10920 Alcohol use, unspecified with intoxication, uncomplicated: Secondary | ICD-10-CM

## 2021-03-05 DIAGNOSIS — R079 Chest pain, unspecified: Secondary | ICD-10-CM

## 2021-03-05 DIAGNOSIS — J45909 Unspecified asthma, uncomplicated: Secondary | ICD-10-CM | POA: Insufficient documentation

## 2021-03-05 DIAGNOSIS — Y908 Blood alcohol level of 240 mg/100 ml or more: Secondary | ICD-10-CM | POA: Insufficient documentation

## 2021-03-05 DIAGNOSIS — R0781 Pleurodynia: Secondary | ICD-10-CM | POA: Insufficient documentation

## 2021-03-05 DIAGNOSIS — W19XXXA Unspecified fall, initial encounter: Secondary | ICD-10-CM | POA: Insufficient documentation

## 2021-03-05 DIAGNOSIS — Z87891 Personal history of nicotine dependence: Secondary | ICD-10-CM | POA: Insufficient documentation

## 2021-03-05 DIAGNOSIS — Z79899 Other long term (current) drug therapy: Secondary | ICD-10-CM | POA: Insufficient documentation

## 2021-03-05 DIAGNOSIS — F10129 Alcohol abuse with intoxication, unspecified: Secondary | ICD-10-CM | POA: Insufficient documentation

## 2021-03-05 LAB — URINALYSIS, COMPLETE (UACMP) WITH MICROSCOPIC
Bacteria, UA: NONE SEEN
Bilirubin Urine: NEGATIVE
Glucose, UA: NEGATIVE mg/dL
Hgb urine dipstick: NEGATIVE
Ketones, ur: NEGATIVE mg/dL
Leukocytes,Ua: NEGATIVE
Nitrite: NEGATIVE
Protein, ur: NEGATIVE mg/dL
Specific Gravity, Urine: 1.005 (ref 1.005–1.030)
pH: 6 (ref 5.0–8.0)

## 2021-03-05 LAB — CBC WITH DIFFERENTIAL/PLATELET
Abs Immature Granulocytes: 0.02 10*3/uL (ref 0.00–0.07)
Basophils Absolute: 0.1 10*3/uL (ref 0.0–0.1)
Basophils Relative: 1 %
Eosinophils Absolute: 0.3 10*3/uL (ref 0.0–0.5)
Eosinophils Relative: 3 %
HCT: 36.6 % — ABNORMAL LOW (ref 39.0–52.0)
Hemoglobin: 11.9 g/dL — ABNORMAL LOW (ref 13.0–17.0)
Immature Granulocytes: 0 %
Lymphocytes Relative: 22 %
Lymphs Abs: 1.7 10*3/uL (ref 0.7–4.0)
MCH: 27 pg (ref 26.0–34.0)
MCHC: 32.5 g/dL (ref 30.0–36.0)
MCV: 83 fL (ref 80.0–100.0)
Monocytes Absolute: 0.7 10*3/uL (ref 0.1–1.0)
Monocytes Relative: 9 %
Neutro Abs: 5.2 10*3/uL (ref 1.7–7.7)
Neutrophils Relative %: 65 %
Platelets: 226 10*3/uL (ref 150–400)
RBC: 4.41 MIL/uL (ref 4.22–5.81)
RDW: 17.6 % — ABNORMAL HIGH (ref 11.5–15.5)
WBC: 7.9 10*3/uL (ref 4.0–10.5)
nRBC: 0 % (ref 0.0–0.2)

## 2021-03-05 LAB — COMPREHENSIVE METABOLIC PANEL
ALT: 15 U/L (ref 0–44)
AST: 17 U/L (ref 15–41)
Albumin: 3.8 g/dL (ref 3.5–5.0)
Alkaline Phosphatase: 53 U/L (ref 38–126)
Anion gap: 8 (ref 5–15)
BUN: 14 mg/dL (ref 8–23)
CO2: 25 mmol/L (ref 22–32)
Calcium: 8.8 mg/dL — ABNORMAL LOW (ref 8.9–10.3)
Chloride: 106 mmol/L (ref 98–111)
Creatinine, Ser: 0.93 mg/dL (ref 0.61–1.24)
GFR, Estimated: >60 mL/min (ref >60)
Glucose, Bld: 106 mg/dL — ABNORMAL HIGH (ref 70–99)
Potassium: 3.8 mmol/L (ref 3.5–5.1)
Sodium: 139 mmol/L (ref 135–145)
Total Bilirubin: 0.6 mg/dL (ref 0.3–1.2)
Total Protein: 6.6 g/dL (ref 6.5–8.1)

## 2021-03-05 LAB — ETHANOL: Alcohol, Ethyl (B): 281 mg/dL — ABNORMAL HIGH (ref <10)

## 2021-03-05 LAB — TROPONIN I (HIGH SENSITIVITY): Troponin I (High Sensitivity): 3 ng/L (ref <18)

## 2021-03-05 MED ORDER — THIAMINE HCL 100 MG/ML IJ SOLN
500.0000 mg | Freq: Once | INTRAMUSCULAR | Status: AC
  Administered 2021-03-05: 500 mg via INTRAMUSCULAR
  Filled 2021-03-05: qty 6

## 2021-03-05 MED ORDER — LACTATED RINGERS IV BOLUS
1000.0000 mL | Freq: Once | INTRAVENOUS | Status: AC
  Administered 2021-03-05: 1000 mL via INTRAVENOUS

## 2021-03-05 MED ORDER — CHLORDIAZEPOXIDE HCL 25 MG PO CAPS
25.0000 mg | ORAL_CAPSULE | Freq: Once | ORAL | Status: AC
  Administered 2021-03-05: 25 mg via ORAL
  Filled 2021-03-05: qty 1

## 2021-03-05 MED ORDER — ADULT MULTIVITAMIN W/MINERALS CH
1.0000 | ORAL_TABLET | Freq: Once | ORAL | Status: AC
  Administered 2021-03-05: 1 via ORAL
  Filled 2021-03-05: qty 1

## 2021-03-05 NOTE — ED Notes (Signed)
Pt given phone to call cab. Driver states he is 15 minutes away from hospital to pick pt up. IV removed. Discharge paperwork reviewed. Pt ambulatory to lobby to wait for cab. First nurse notified.

## 2021-03-05 NOTE — ED Triage Notes (Addendum)
Pt comes ems from home with etoh on board. C/o some left rib pain from a fall yesterday.  VSS with EMS. CBG 129.

## 2021-03-05 NOTE — ED Provider Notes (Signed)
[facility] Emergency Department Provider Note ____________________________________________   Event Date/Time   First MD Initiated Contact with Patient 03/05/21 1515     (approximate)  I have reviewed the triage vital signs and the nursing notes.  HISTORY  Chief Complaint Alcohol Intoxication   HPI Miguel [name] is a 60 y.o. malewho presents to the ED for evaluation of alcohol intoxication.   Chart review indicates history of alcoholism and patient is well-known to our department with recurrent visits.  Patient presents from home via EMS to the ED for evaluation of left-sided rib pain after a fall yesterday.  Patient reports stumbling and a fall onto his left flank/chest wall that occurred yesterday.  Denies syncope or head trauma.  Reports soreness to his left ribs, increasing throughout the day today.  He reports that he was beneath the car today trying to fix a fender when either something fell again onto his side, or he had difficulty getting up due to the pain.  He seems to go back and forth about what caused him to call 911 today.  Denies assault, recent illnesses, fever, emesis, syncopal episodes or changes in his ambulation.  Later, during my reevaluation and longer conversation with the patient.  He indicates that he "just felt bad today."  Patient reports feeling better here at the hospital than he does at home due to kilts of caring for his wife with MS while he is drinking.  Denies any injuries or falls today when I discussed with him later.  Past Medical History:  Diagnosis Date  . Alcohol abuse   . Anxiety   . Asthma   . Family history of breast cancer   . GERD (gastroesophageal reflux disease)   . Gout   . Hx of colonic polyps   . Hypertension   . Kidney stone   . 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 OCD (obsessive compulsive disorder)   . Renal colic     Patient Active Problem List   Diagnosis Date Noted  .  Hypotension 11/28/2020  . Genetic testing 07/15/2020  . 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 Right ankle pain 06/26/2020  . Family history of breast cancer   . Hx of colonic polyps   . Hospital discharge follow-up 06/05/2020  . Kidney function test abnormal 06/05/2020  . Abdominal pain 04/12/2020  . Hypertensive urgency 04/12/2020  . Alcohol withdrawal ([place]) 03/04/2020  . Right-sided chest wall pain 02/28/2020  . Alcohol abuse with alcohol-induced mood disorder ([place]) 12/02/2019  . Moderate episode of recurrent major depressive disorder ([place])   . Health care maintenance 10/26/2019  . Right knee pain 10/26/2019  . Generalized anxiety disorder 10/14/2019  . MDD (major depressive disorder), recurrent severe, without psychosis ([place]) 07/27/2019  . Social anxiety disorder 07/27/2019  . Left-sided chest wall pain 06/14/2019  . Alcoholic cirrhosis of liver without ascites ([place]) 06/14/2019  . Alcohol abuse 06/14/2019  . AKI (acute kidney injury) ([place]) 05/27/2019  . Alcohol withdrawal syndrome without complication ([place]) 60/45/4098  . Alcoholic intoxication without complication ([place])   . Sepsis ([place]) 03/08/2019  . Breast lump or mass 10/04/2018  . Sepsis secondary to UTI ([place]) 09/14/2018  . Asthma 07/07/2018  . GERD (gastroesophageal reflux disease) 07/07/2018  . OCD (obsessive compulsive disorder) 07/07/2018  . Self-inflicted laceration of left wrist ([place]) 03/10/2018  . Leg hematoma 12/25/2017  . Suicide and self-inflicted injury by cutting and piercing instrument ([place]) 03/10/2017  .  Severe recurrent major 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 ([place]) 03/09/2017  . Substance induced mood disorder ([place]) 08/15/2016  . Involuntary commitment 08/15/2016  . Alcohol use disorder, severe, dependence ([place]) 02/05/2016  . Hypertension 12/05/2015  . Tachycardia 12/05/2015  . Gout 11/13/2015  . Chronic back pain 05/02/2015    Past Surgical History:  Procedure Laterality Date  .  CHOLECYSTECTOMY  2012  . COLONOSCOPY    . COLONOSCOPY WITH PROPOFOL N/A 05/28/2020   Procedure: COLONOSCOPY WITH PROPOFOL;  Surgeon: [name], MD;  Location: [facility] ENDOSCOPY;  Service: Gastroenterology;  Laterality: N/A;  . EXTRACORPOREAL SHOCK WAVE LITHOTRIPSY Left 01/12/2019   Procedure: EXTRACORPOREAL SHOCK WAVE LITHOTRIPSY (ESWL);  Surgeon: Billey Co, MD;  Location: [facility] ORS;  Service: Urology;  Laterality: Left;  . UPPER GI ENDOSCOPY      Prior to Admission medications   Medication Sig Start Date End Date Taking? Authorizing Provider  ADVAIR DISKUS 100-50 MCG/DOSE AEPB INHALE 1 PUFF 2 TIMES A DAY. RINSE MOUTH AND SPIT AFTER EACH USE. 06/12/20 06/12/21  Tawni Millers, MD  allopurinol (ZYLOPRIM) 300 MG tablet TAKE ONE TABLET BY MOUTH EVERY DAY 09/18/20 04/06/21  Iloabachie, Chioma E, NP  ascorbic acid (VITAMIN C) 500 MG tablet Take by mouth. 08/01/19   [provider]  chlordiazePOXIDE (LIBRIUM) 5 MG capsule Take 6 caps (30 mg) 4x daily on day 1. Take 5 caps (25 mg) 4x daily on day 2. Take 4 caps (20 mg) 4x daily on day 3. Take 3 caps (15 mg) 4x daily on day 4. Take 2 caps (10 mg) 4x daily on day 5. Take 2 caps (10 mg) 3x daily on day 6. Take 1 cap (5 mg) 3x daily on day 7. Take 1 cap (5 mg) twice daily on day 8. Take 1 cap (5 mg) in the evening on day 9. 01/18/21   Hinda Kehr, MD  citalopram (CELEXA) 20 MG tablet Take 1 tablet (20 mg total) by mouth daily. 03/21/20 03/21/21  Clapacs, Madie Reno, MD  fexofenadine-pseudoephedrine (ALLEGRA-D) 60-120 MG 12 hr tablet Take 1 tablet by mouth 2 (two) times daily. 12/31/20   Sable Feil, PA-C  Fluticasone-Salmeterol (ADVAIR DISKUS) 100-50 MCG/DOSE AEPB Inhale 1 puff into the lungs 2 (two) times daily. 03/21/20   Clapacs, Madie Reno, MD  folic acid (FOLVITE) 1 MG tablet TAKE ONE TABLET BY MOUTH EVERY DAY 01/07/21 01/07/22  Iloabachie, Chioma E, NP  lisinopril (ZESTRIL) 40 MG tablet TAKE ONE TABLET BY MOUTH EVERY DAY 09/18/20 09/18/21  Iloabachie, Chioma  E, NP  LORazepam (ATIVAN) 1 MG tablet Take 1 tablet (1 mg total) by mouth every 8 (eight) hours as needed for anxiety. 02/28/21   Paulette Blanch, MD  metoprolol tartrate (LOPRESSOR) 25 MG tablet Take 1 tablet (25 mg total) by mouth daily. 11/30/20 12/30/20  Wyvonnia Dusky, MD  pantoprazole (PROTONIX) 40 MG tablet TAKE 1 TABLET (40 MG TOTAL) BY MOUTH DAILY. 10/15/20 10/15/21  Iloabachie, Chioma E, NP  sulfamethoxazole-trimethoprim (BACTRIM DS) 800-160 MG tablet TAKE ONE TABLET BY MOUTH 2 TIMES A DAY 12/31/20 12/31/21  Sable Feil, PA-C  thiamine (VITAMIN B-1) 100 MG tablet TAKE ONE TABLET BY MOUTH EVERY DAY 10/22/20 10/22/21  Iloabachie, Chioma E, NP  thiamine 100 MG tablet Take 1 tablet (100 mg total) by mouth daily. 10/22/20   Iloabachie, Chioma E, NP  VENTOLIN HFA 108 (90 Base) MCG/ACT inhaler INHALE 2 PUFFS EVERY 4 HOURS AS NEEDED 10/22/20   Iloabachie, Chioma E, NP  VENTOLIN HFA 108 (  90 Base) MCG/ACT inhaler INHALE 2 PUFFS EVERY 4 HOURS AS NEEDED 10/22/20 10/22/21  Tawni Millers, MD    Allergies Percocet [oxycodone-acetaminophen]  Family History  Problem Relation Age of Onset  . Alcohol abuse Father   . Breast cancer Mother 44    Social History Social History   Tobacco Use  . Smoking status: Former Smoker    Types: Cigarettes  . Smokeless tobacco: Never Used  . Tobacco comment: quit 30 years ago  Vaping Use  . Vaping Use: Never used  Substance Use Topics  . Alcohol use: Yes    Alcohol/week: 12.0 standard drinks    Types: 12 Shots of liquor per week  . Drug use: No    Review of Systems  Constitutional: No fever/chills Eyes: No visual changes. ENT: No sore throat. Cardiovascular: Left-sided chest wall pain after a fall. Respiratory: Denies shortness of breath. Gastrointestinal: No abdominal pain.  No nausea, no vomiting.  No diarrhea.  No constipation. Genitourinary: Negative for dysuria. Musculoskeletal: Negative for back pain. Skin: Negative for rash. Neurological:  Negative for headaches, focal weakness or numbness.  ____________________________________________   PHYSICAL EXAM:  VITAL SIGNS: Vitals:   03/05/21 1938 03/05/21 2103  BP: 139/88 136/90  Pulse: 92 95  Resp: 16 16  Temp:    SpO2: 97% 97%      Constitutional: Alert and oriented.  Clinically intoxicated without distress.  Follows commands in all 4 extremities.Marland Kitchen  He is sitting up on the foot of the bed when I first evaluate him, and with encouragement, he is able to use bilateral arms and legs to get up onto the stretcher and push himself back to appropriate positioning.  No laterality or deficits noted. Eyes: Conjunctivae are normal. PERRL. EOMI. Head: Atraumatic. Nose: No congestion/rhinnorhea. Mouth/Throat: Mucous membranes are moist.  Oropharynx non-erythematous. Neck: No stridor. No cervical spine tenderness to palpation. Cardiovascular: Normal rate, regular rhythm. Grossly normal heart sounds.  Good peripheral circulation. Respiratory: Normal respiratory effort.  No retractions. Lungs CTAB. Gastrointestinal: Soft , nondistended, nontender to palpation. No CVA tenderness. Musculoskeletal: No lower extremity tenderness nor edema.  No joint effusions.  Poorly localizing tenderness throughout his left upper flank around T4/T5 and at the anterior axillary line primarily.  No bony step-offs, signs of skin changes or external trauma. Neurologic:   No gross focal neurologic deficits are appreciated.  Mild dysarthria, otherwise neurologically intact without evidence of deficits. Cranial nerves II through XII intact 5/5 strength and sensation in all 4 extremities Skin:  Skin is warm, dry and intact. No rash noted. Psychiatric: Mood and affect are normal. Speech and behavior are normal.  ____________________________________________   LABS (all labs ordered are listed, but only abnormal results are displayed)  Labs Reviewed  CBC WITH DIFFERENTIAL/PLATELET - Abnormal; Notable for the  following components:      Result Value   Hemoglobin 11.9 (*)    HCT 36.6 (*)    RDW 17.6 (*)    All other components within normal limits  COMPREHENSIVE METABOLIC PANEL - Abnormal; Notable for the following components:   Glucose, Bld 106 (*)    Calcium 8.8 (*)    All other components within normal limits  URINALYSIS, COMPLETE (UACMP) WITH MICROSCOPIC - Abnormal; Notable for the following components:   Color, Urine STRAW (*)    APPearance CLEAR (*)    All other components within normal limits  ETHANOL - Abnormal; Notable for the following components:   Alcohol, Ethyl (B) 281 (*)    All  other components within normal limits  TROPONIN I (HIGH SENSITIVITY)    ____________________________________________  RADIOLOGY  ED MD interpretation: 2 view CXR reviewed by me without evidence of acute cardiopulmonary pathology.  Official radiology report(s): DG Chest 2 View  Result Date: 03/05/2021 CLINICAL DATA:  [place].  Left-sided chest pain. EXAM: CHEST - 2 VIEW COMPARISON:  02/10/2021 FINDINGS: The cardiac silhouette, mediastinal and hilar contours are within normal limits and stable. Low lung volumes with streaky bibasilar atelectasis, left greater than right. No pleural effusion or pneumothorax. No definite acute rib fractures. The thoracic and upper lumbar vertebral bodies are normally aligned. No acute fracture. IMPRESSION: Low lung volumes with streaky bibasilar atelectasis. Electronically Signed   By: Marijo Sanes M.D.   On: 03/05/2021 16:18    ____________________________________________   PROCEDURES and INTERVENTIONS  Procedure(s) performed (including Critical Care):  .1-3 Lead EKG Interpretation Performed by: Vladimir Crofts, MD Authorized by: Vladimir Crofts, MD     Interpretation: normal     ECG rate:  90   ECG rate assessment: normal     Rhythm: sinus rhythm     Ectopy: none     Conduction: normal      Medications  lactated ringers bolus 1,000 mL (0 mLs Intravenous  Stopped 03/05/21 1857)  thiamine (B-1) injection 500 mg (500 mg Intramuscular Given 03/05/21 1654)  multivitamin with minerals tablet 1 tablet (1 tablet Oral Given 03/05/21 1652)  chlordiazePOXIDE (LIBRIUM) capsule 25 mg (25 mg Oral Given 03/05/21 1938)    ____________________________________________   MDM / ED COURSE   60 year old male with alcoholism and well-known to our department presents to the ED with generalized feeling unwell and a possible fall that occurred yesterday, without evidence of acute derangements, amenable to sobering up in the ED and outpatient management thereafter.  Normal vitals on room air.  Exam initially with mild dysarthria and clinical intoxication without distress, signs of trauma or any neurologic or vascular deficits.  Some mild and poorly localizing tenderness to his left chest wall is noted without overlying signs of trauma.  CXR without infiltrate, PTX, rib fractures or evidence of pulmonary contusion.  Blood work is benign and does demonstrate ethanol intoxication, as clinically expected.  Provided fluid resuscitation, thiamine and multivitamin and patient took a nap.  Subsequently waking up and requesting Ativan, but provided Librium.  No signs of DTs, or significant withdrawals.  Certainly no seizure activity.  Later reevaluation, as indicated below, ends up in a rather long conversation at 10 or 15 minutes.  He elaborates on his home psychosocial stressors with his wife at home.  I provided encouragement and active listening.  We discussed steps to quit drinking.  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  We discussed following up with [facility] and using them as a resource for support.  We discussed return precautions for the ED and patient is stable for outpatient management.  Clinical Course as of 03/05/21 2126  Wed Mar 05, 2021  1850 Reassessed.  Patient  requesting Ativan.  I see no indication for this.  We discussed p.o. challenge [DS]  2045 Long chat at the bedside with patient and discussing his psychosocial stressors.  We discussed his wife with MS.  We discussed him having to stay at home to help her out.  We discussed his drinking habits.  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  We discussed  steps to quit drinking and following up with [facility].  We discussed return precautions for the ED. [DS]    Clinical Course User Index [DS] Vladimir Crofts, MD    ____________________________________________   FINAL CLINICAL IMPRESSION(S) / ED DIAGNOSES  Final diagnoses:  Alcoholic intoxication without complication ([place])  Left-sided chest pain     ED Discharge Orders    None       Lemario Chaikin   Note:  This document was prepared using Dragon voice recognition software and may include unintentional dictation errors.   Vladimir Crofts, MD 03/05/21 2128

## 2021-03-05 NOTE — ED Notes (Signed)
EDP at bedside with pt. Pt tried to get OOB. Pt refused to stay in bed. Helped pt to stand to void in  Urinal. Pt states was "under the car" for 3d. Unable to decsribe why he called EMS.

## 2021-03-10 ENCOUNTER — Emergency Department
Admission: EM | Admit: 2021-03-10 | Discharge: 2021-03-11 | Disposition: A | Discharge status: Home / Self Care | Payer: Medicaid Other | Attending: Emergency Medicine | Admitting: Emergency Medicine

## 2021-03-10 ENCOUNTER — Other Ambulatory Visit: Payer: Self-pay

## 2021-03-10 DIAGNOSIS — Z87891 Personal history of nicotine dependence: Secondary | ICD-10-CM | POA: Insufficient documentation

## 2021-03-10 DIAGNOSIS — F10220 Alcohol dependence with intoxication, uncomplicated: Secondary | ICD-10-CM | POA: Insufficient documentation

## 2021-03-10 DIAGNOSIS — J45909 Unspecified asthma, uncomplicated: Secondary | ICD-10-CM | POA: Insufficient documentation

## 2021-03-10 DIAGNOSIS — F10920 Alcohol use, unspecified with intoxication, uncomplicated: Secondary | ICD-10-CM

## 2021-03-10 DIAGNOSIS — Z046 Encounter for general psychiatric examination, requested by authority: Secondary | ICD-10-CM | POA: Insufficient documentation

## 2021-03-10 DIAGNOSIS — Z79899 Other long term (current) drug therapy: Secondary | ICD-10-CM | POA: Insufficient documentation

## 2021-03-10 DIAGNOSIS — X789XXA Intentional self-harm by unspecified sharp object, initial encounter: Secondary | ICD-10-CM | POA: Insufficient documentation

## 2021-03-10 DIAGNOSIS — S71111A Laceration without foreign body, right thigh, initial encounter: Secondary | ICD-10-CM | POA: Insufficient documentation

## 2021-03-10 DIAGNOSIS — I10 Essential (primary) hypertension: Secondary | ICD-10-CM | POA: Insufficient documentation

## 2021-03-10 DIAGNOSIS — Z7951 Long term (current) use of inhaled steroids: Secondary | ICD-10-CM | POA: Insufficient documentation

## 2021-03-10 DIAGNOSIS — R45851 Suicidal ideations: Secondary | ICD-10-CM | POA: Insufficient documentation

## 2021-03-10 DIAGNOSIS — Y908 Blood alcohol level of 240 mg/100 ml or more: Secondary | ICD-10-CM | POA: Insufficient documentation

## 2021-03-10 DIAGNOSIS — Z20822 Contact with and (suspected) exposure to covid-19: Secondary | ICD-10-CM | POA: Insufficient documentation

## 2021-03-10 LAB — COMPREHENSIVE METABOLIC PANEL
ALT: 14 U/L (ref 0–44)
AST: 16 U/L (ref 15–41)
Albumin: 4.1 g/dL (ref 3.5–5.0)
Alkaline Phosphatase: 69 U/L (ref 38–126)
Anion gap: 13 (ref 5–15)
BUN: 17 mg/dL (ref 8–23)
CO2: 19 mmol/L — ABNORMAL LOW (ref 22–32)
Calcium: 8.9 mg/dL (ref 8.9–10.3)
Chloride: 109 mmol/L (ref 98–111)
Creatinine, Ser: 1.22 mg/dL (ref 0.61–1.24)
GFR, Estimated: >60 mL/min (ref >60)
Glucose, Bld: 108 mg/dL — ABNORMAL HIGH (ref 70–99)
Potassium: 4 mmol/L (ref 3.5–5.1)
Sodium: 141 mmol/L (ref 135–145)
Total Bilirubin: 0.7 mg/dL (ref 0.3–1.2)
Total Protein: 7.2 g/dL (ref 6.5–8.1)

## 2021-03-10 LAB — URINE DRUG SCREEN, QUALITATIVE (ARMC ONLY)
Amphetamines, Ur Screen: NOT DETECTED
Barbiturates, Ur Screen: NOT DETECTED
Benzodiazepine, Ur Scrn: POSITIVE — AB
Cannabinoid 50 Ng, Ur ~~LOC~~: POSITIVE — AB
Cocaine Metabolite,Ur ~~LOC~~: NOT DETECTED
MDMA (Ecstasy)Ur Screen: NOT DETECTED
Methadone Scn, Ur: NOT DETECTED
Opiate, Ur Screen: NOT DETECTED
Phencyclidine (PCP) Ur S: NOT DETECTED
Tricyclic, Ur Screen: NOT DETECTED

## 2021-03-10 LAB — CBC
HCT: 40 % (ref 39.0–52.0)
Hemoglobin: 13.2 g/dL (ref 13.0–17.0)
MCH: 27 pg (ref 26.0–34.0)
MCHC: 33 g/dL (ref 30.0–36.0)
MCV: 81.8 fL (ref 80.0–100.0)
Platelets: 187 10*3/uL (ref 150–400)
RBC: 4.89 MIL/uL (ref 4.22–5.81)
RDW: 18 % — ABNORMAL HIGH (ref 11.5–15.5)
WBC: 8.9 10*3/uL (ref 4.0–10.5)
nRBC: 0 % (ref 0.0–0.2)

## 2021-03-10 LAB — RESP PANEL BY RT-PCR (FLU A&B, COVID) ARPGX2
Influenza A by PCR: NEGATIVE
Influenza B by PCR: NEGATIVE
SARS Coronavirus 2 by RT PCR: NEGATIVE

## 2021-03-10 LAB — ETHANOL: Alcohol, Ethyl (B): 347 mg/dL (ref <10)

## 2021-03-10 MED ORDER — ZIPRASIDONE MESYLATE 20 MG IM SOLR
20.0000 mg | Freq: Once | INTRAMUSCULAR | Status: AC
  Administered 2021-03-10: 20 mg via INTRAMUSCULAR
  Filled 2021-03-10: qty 20

## 2021-03-10 MED ORDER — LORAZEPAM 2 MG PO TABS
0.0000 mg | ORAL_TABLET | Freq: Four times a day (QID) | ORAL | Status: DC
  Administered 2021-03-10: 1 mg via ORAL
  Filled 2021-03-10: qty 1

## 2021-03-10 MED ORDER — LORAZEPAM 2 MG PO TABS: 0.0000 mg | ORAL_TABLET | Freq: Two times a day (BID) | ORAL | Status: DC

## 2021-03-10 MED ORDER — LORAZEPAM 2 MG/ML IJ SOLN: 0.0000 mg | Freq: Four times a day (QID) | INTRAMUSCULAR | Status: DC

## 2021-03-10 MED ORDER — LORAZEPAM 2 MG/ML IJ SOLN: 0.0000 mg | Freq: Two times a day (BID) | INTRAMUSCULAR | Status: DC

## 2021-03-10 MED ORDER — THIAMINE HCL 100 MG/ML IJ SOLN: 100.0000 mg | Freq: Every day | INTRAMUSCULAR | Status: DC

## 2021-03-10 MED ORDER — THIAMINE HCL 100 MG PO TABS: 100.0000 mg | ORAL_TABLET | Freq: Every day | ORAL | Status: DC

## 2021-03-10 NOTE — ED Notes (Signed)
Pt spilled water, this RN assisted patient to clean up room.

## 2021-03-10 NOTE — ED Notes (Addendum)
Assisted to and from bathroom. Pt did not urinate. Provided warm blanket and lights dimmed for comfort. RN within site of patient.

## 2021-03-10 NOTE — ED Notes (Addendum)
Pt banging on bathroom wall, had to be assisted by 2 security and this RN back to room. Pt resistance to assistance. Pt states that he is scared he is going to die.

## 2021-03-10 NOTE — ED Provider Notes (Signed)
[facility] Emergency Department Provider Note  ____________________________________________   Event Date/Time   First MD Initiated Contact with Patient 03/10/21 2045     (approximate)  I have reviewed the triage vital signs and the nursing notes.   HISTORY  Chief Complaint Suicidal    HPI Miguel [name] is a 60 y.o. male here with suicidal ideation.  Patient arrives with police in severe distress.  He is combative, cursing at police.   He appears intoxicated.  Patient reportedly cut his leg superficially and an attempt to hurt himself.  He refuses to allow me to evaluate this.  There was no active bleeding.  Patient is incredibly agitated, cursing examiner.  Remainder of history limited due to his intoxication and agitation.       Past Medical History:  Diagnosis Date  . Alcohol abuse   . Anxiety   . Asthma   . Family history of breast cancer   . GERD (gastroesophageal reflux disease)   . Gout   . Hx of colonic polyps   . Hypertension   . Kidney stone   . 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 OCD (obsessive compulsive disorder)   . Renal colic     Patient Active Problem List   Diagnosis Date Noted  . Hypotension 11/28/2020  . Genetic testing 07/15/2020  . 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 Right ankle pain 06/26/2020  . Family history of breast cancer   . Hx of colonic polyps   . Hospital discharge follow-up 06/05/2020  . Kidney function test abnormal 06/05/2020  . Abdominal pain 04/12/2020  . Hypertensive urgency 04/12/2020  . Alcohol withdrawal ([place]) 03/04/2020  . Right-sided chest wall pain 02/28/2020  . Alcohol abuse with alcohol-induced mood disorder ([place]) 12/02/2019  . Moderate episode of recurrent major depressive disorder ([place])   . Health care maintenance 10/26/2019  . Right knee pain 10/26/2019  . Generalized anxiety disorder 10/14/2019  . MDD (major depressive disorder), recurrent severe,  without psychosis ([place]) 07/27/2019  . Social anxiety disorder 07/27/2019  . Left-sided chest wall pain 06/14/2019  . Alcoholic cirrhosis of liver without ascites ([place]) 06/14/2019  . Alcohol abuse 06/14/2019  . AKI (acute kidney injury) ([place]) 05/27/2019  . Alcohol withdrawal syndrome without complication ([place]) 16/08/9603  . Alcoholic intoxication without complication ([place])   . Sepsis ([place]) 03/08/2019  . Breast lump or mass 10/04/2018  . Sepsis secondary to UTI ([place]) 09/14/2018  . Asthma 07/07/2018  . GERD (gastroesophageal reflux disease) 07/07/2018  . OCD (obsessive compulsive disorder) 07/07/2018  . Self-inflicted laceration of left wrist ([place]) 03/10/2018  . Leg hematoma 12/25/2017  . Suicide and self-inflicted injury by cutting and piercing instrument ([place]) 03/10/2017  . Severe recurrent major 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 ([place]) 03/09/2017  . Substance induced mood disorder ([place]) 08/15/2016  . Involuntary commitment 08/15/2016  . Alcohol use disorder, severe, dependence ([place]) 02/05/2016  . Hypertension 12/05/2015  . Tachycardia 12/05/2015  . Gout 11/13/2015  . Chronic back pain 05/02/2015    Past Surgical History:  Procedure Laterality Date  . CHOLECYSTECTOMY  2012  . COLONOSCOPY    . COLONOSCOPY WITH PROPOFOL N/A 05/28/2020   Procedure: COLONOSCOPY WITH PROPOFOL;  Surgeon: [name], MD;  Location: [facility] ENDOSCOPY;  Service: Gastroenterology;  Laterality: N/A;  . EXTRACORPOREAL SHOCK WAVE LITHOTRIPSY Left 01/12/2019   Procedure: EXTRACORPOREAL SHOCK WAVE LITHOTRIPSY (ESWL);  Surgeon: Billey Co, MD;  Location: [facility] ORS;  Service:  Urology;  Laterality: Left;  . UPPER GI ENDOSCOPY      Prior to Admission medications   Medication Sig Start Date End Date Taking? Authorizing Provider  ADVAIR DISKUS 100-50 MCG/DOSE AEPB INHALE 1 PUFF 2 TIMES A DAY. RINSE MOUTH AND SPIT AFTER EACH USE. 06/12/20 06/12/21  Tawni Millers, MD  allopurinol (ZYLOPRIM) 300 MG tablet TAKE ONE  TABLET BY MOUTH EVERY DAY 09/18/20 04/06/21  Iloabachie, Chioma E, NP  ascorbic acid (VITAMIN C) 500 MG tablet Take by mouth. 08/01/19   [provider]  chlordiazePOXIDE (LIBRIUM) 5 MG capsule Take 6 caps (30 mg) 4x daily on day 1. Take 5 caps (25 mg) 4x daily on day 2. Take 4 caps (20 mg) 4x daily on day 3. Take 3 caps (15 mg) 4x daily on day 4. Take 2 caps (10 mg) 4x daily on day 5. Take 2 caps (10 mg) 3x daily on day 6. Take 1 cap (5 mg) 3x daily on day 7. Take 1 cap (5 mg) twice daily on day 8. Take 1 cap (5 mg) in the evening on day 9. 01/18/21   Hinda Kehr, MD  citalopram (CELEXA) 20 MG tablet Take 1 tablet (20 mg total) by mouth daily. 03/21/20 03/21/21  Clapacs, Madie [place], MD  fexofenadine-pseudoephedrine (ALLEGRA-D) 60-120 MG 12 hr tablet Take 1 tablet by mouth 2 (two) times daily. 12/31/20   Sable Feil, PA-C  Fluticasone-Salmeterol (ADVAIR DISKUS) 100-50 MCG/DOSE AEPB Inhale 1 puff into the lungs 2 (two) times daily. 03/21/20   Clapacs, Madie [place], MD  folic acid (FOLVITE) 1 MG tablet TAKE ONE TABLET BY MOUTH EVERY DAY 01/07/21 01/07/22  Iloabachie, Chioma E, NP  lisinopril (ZESTRIL) 40 MG tablet TAKE ONE TABLET BY MOUTH EVERY DAY 09/18/20 09/18/21  Iloabachie, Chioma E, NP  LORazepam (ATIVAN) 1 MG tablet Take 1 tablet (1 mg total) by mouth every 8 (eight) hours as needed for anxiety. 02/28/21   Paulette Blanch, MD  metoprolol tartrate (LOPRESSOR) 25 MG tablet Take 1 tablet (25 mg total) by mouth daily. 11/30/20 12/30/20  Wyvonnia Dusky, MD  pantoprazole (PROTONIX) 40 MG tablet TAKE 1 TABLET (40 MG TOTAL) BY MOUTH DAILY. 10/15/20 10/15/21  Iloabachie, Chioma E, NP  sulfamethoxazole-trimethoprim (BACTRIM DS) 800-160 MG tablet TAKE ONE TABLET BY MOUTH 2 TIMES A DAY 12/31/20 12/31/21  Sable Feil, PA-C  thiamine (VITAMIN B-1) 100 MG tablet TAKE ONE TABLET BY MOUTH EVERY DAY 10/22/20 10/22/21  Iloabachie, Chioma E, NP  thiamine 100 MG tablet Take 1 tablet (100 mg total) by mouth daily. 10/22/20    Iloabachie, Chioma E, NP  VENTOLIN HFA 108 (90 Base) MCG/ACT inhaler INHALE 2 PUFFS EVERY 4 HOURS AS NEEDED 10/22/20   Iloabachie, Chioma E, NP  VENTOLIN HFA 108 (90 Base) MCG/ACT inhaler INHALE 2 PUFFS EVERY 4 HOURS AS NEEDED 10/22/20 10/22/21  Tawni Millers, MD    Allergies Percocet [oxycodone-acetaminophen]  Family History  Problem Relation Age of Onset  . Alcohol abuse Father   . Breast cancer Mother 42    Social History Social History   Tobacco Use  . Smoking status: Former Smoker    Types: Cigarettes  . Smokeless tobacco: Never Used  . Tobacco comment: quit 30 years ago  Vaping Use  . Vaping Use: Never used  Substance Use Topics  . Alcohol use: Yes    Alcohol/week: 12.0 standard drinks    Types: 12 Shots of liquor per week  . Drug use: No  Review of Systems  Review of Systems  Unable to perform ROS: Psychiatric disorder     ____________________________________________  PHYSICAL EXAM:      VITAL SIGNS: ED Triage Vitals [03/10/21 2036]  Enc Vitals Group     BP (!) 151/91     Pulse Rate 97     Resp 14     Temp 98.2 F (36.8 C)     Temp Source Oral     SpO2 100 %     Weight 176 lb 5.9 oz (80 kg)     Height 6' (1.829 m)     Head Circumference      Peak Flow      Pain Score 0     Pain Loc      Pain Edu?      Excl. in [place]?      Physical Exam Vitals and nursing note reviewed.  Constitutional:      General: He is not in acute distress.    Appearance: He is well-developed.  HENT:     Head: Normocephalic and atraumatic.  Eyes:     Conjunctiva/sclera: Conjunctivae normal.  Cardiovascular:     Rate and Rhythm: Normal rate and regular rhythm.     Heart sounds: Normal heart sounds.  Pulmonary:     Effort: Pulmonary effort is normal. No respiratory distress.     Breath sounds: No wheezing.  Abdominal:     General: There is no distension.  Musculoskeletal:     Cervical back: Neck supple.  Skin:    General: Skin is warm.     Capillary Refill:  Capillary refill takes less than 2 seconds.     Findings: No rash.  Neurological:     Mental Status: He is alert and oriented to person, place, and time.     Motor: No abnormal muscle tone.  Psychiatric:        Behavior: Behavior is uncooperative, agitated and aggressive.        Judgment: Judgment is inappropriate.       ____________________________________________   LABS (all labs ordered are listed, but only abnormal results are displayed)  Labs Reviewed  COMPREHENSIVE METABOLIC PANEL - Abnormal; Notable for the following components:      Result Value   CO2 19 (*)    Glucose, Bld 108 (*)    All other components within normal limits  ETHANOL - Abnormal; Notable for the following components:   Alcohol, Ethyl (B) 347 (*)    All other components within normal limits  CBC - Abnormal; Notable for the following components:   RDW 18.0 (*)    All other components within normal limits  URINE DRUG SCREEN, QUALITATIVE ([facility] ONLY) - Abnormal; Notable for the following components:   Cannabinoid 50 Ng, Ur [place] POSITIVE (*)    Benzodiazepine, Ur Scrn POSITIVE (*)    All other components within normal limits  RESP PANEL BY RT-PCR (FLU A&B, COVID) [id number]    ____________________________________________  EKG:  ________________________________________  RADIOLOGY All imaging, including plain films, CT scans, and ultrasounds, independently reviewed by me, and interpretations confirmed via formal radiology reads.  ED MD interpretation:     Official radiology report(s): No results found.  ____________________________________________  PROCEDURES   Procedure(s) performed (including Critical Care):  Procedures  ____________________________________________  INITIAL IMPRESSION / MDM / [place] / ED COURSE  As part of my medical decision making, I reviewed the following data within the [place] notes reviewed and incorporated, Old  chart reviewed,  Notes from prior ED visits, and [place] Controlled Substance Database       *Miguel [name] was evaluated in Emergency Department on 03/11/2021 for the symptoms described in the history of present illness. He was evaluated in the context of the global COVID-19 pandemic, which necessitated consideration that the patient might be at risk for infection with the SARS-CoV-2 virus that causes COVID-19. Institutional protocols and algorithms that pertain to the evaluation of patients at risk for COVID-19 are in a state of rapid change based on information released by regulatory bodies including the CDC and federal and state organizations. These policies and algorithms were followed during the patient's care in the ED.  Some ED evaluations and interventions may be delayed as a result of limited staffing during the pandemic.*     Medical Decision Making:  60 yo M with h/o frequent ED visits for EtOH intoxication, here with reported suicidal ideation and significant agitation. Arrives under IVC from PD. On arrival, pt combative, hitting the walls. Refuses to allow me to examine him. He reports that he cut his leg in self harm attempt though no deep lacerations per report from PD dressing him out. He will not allow me to look safely at this time. Will give geodon, continue IVC. Labs reviewed and are unremarkable with exception of significantly elevated EtOH. CIWA ordered.   ____________________________________________  FINAL CLINICAL IMPRESSION(S) / ED DIAGNOSES  Final diagnoses:  Alcoholic intoxication without complication ([place])     MEDICATIONS GIVEN DURING THIS VISIT:  Medications  LORazepam (ATIVAN) injection 0-4 mg ( Intravenous See Alternative 03/10/21 2300)    Or  LORazepam (ATIVAN) tablet 0-4 mg (1 mg Oral Given 03/10/21 2300)  LORazepam (ATIVAN) injection 0-4 mg (has no administration in time range)    Or  LORazepam (ATIVAN) tablet 0-4 mg (has no administration in time range)  thiamine tablet 100 mg  (has no administration in time range)    Or  thiamine (B-1) injection 100 mg (has no administration in time range)  ziprasidone (GEODON) injection 20 mg (20 mg Intramuscular Given 03/10/21 2114)     ED Discharge Orders    None       Note:  This document was prepared using Dragon voice recognition software and may include unintentional dictation errors.   Duffy Bruce, MD 03/11/21 [phone]

## 2021-03-10 NOTE — ED Notes (Signed)
Pt sat down on floor and scooting across floor to the door. Pt then scooted back to bed when RN asked him to get back in bed.

## 2021-03-10 NOTE — ED Triage Notes (Addendum)
BIB [facility] from home with [employer] due to IVC. Pt wife called [employer] due to patient being intoxication and cutting his right thigh with a grill utensil. Superficial scratches down right leg, no bleeding. Pt banging on garage door in room with hand. Pt rambling, uncooperative with staff on arrival and had to have assistance of security and [employer] to have patient sit down and change into behavioral policy clothing.\ Dressed out by [employer] with security at bedside; belongings include shoes, grey pants, underwear, t shirt, cell phone and wallet.  1 of 1 bag labeled. EMS reports VSS and CBG 101. Pt has not been taking his lithium.

## 2021-03-10 NOTE — ED Notes (Addendum)
Pt voluntarily sat down in bed and took IM injection with verbal reassurance.

## 2021-03-10 NOTE — ED Notes (Signed)
Given snack and water per request.

## 2021-03-10 NOTE — ED Notes (Addendum)
Refusing bloodwork at this time. Requesting to see a doctor first.  Pt now tearful.

## 2021-03-10 NOTE — ED Notes (Signed)
This RN visualized patient sit down on floor and roll around on floor. explained to pt that he needs to get back in bed, pt refuses to get up. This RN sitting within constant viewing of patient to monitor patient safety.

## 2021-03-11 ENCOUNTER — Other Ambulatory Visit: Payer: Self-pay

## 2021-03-11 IMAGING — DX DG CHEST 1V PORT
1 series · 1 of 1 positions shown · non-contrast
Comparison: None.

CLINICAL DATA: Cough

EXAM:
PORTABLE CHEST 1 VIEW

[chest ap]
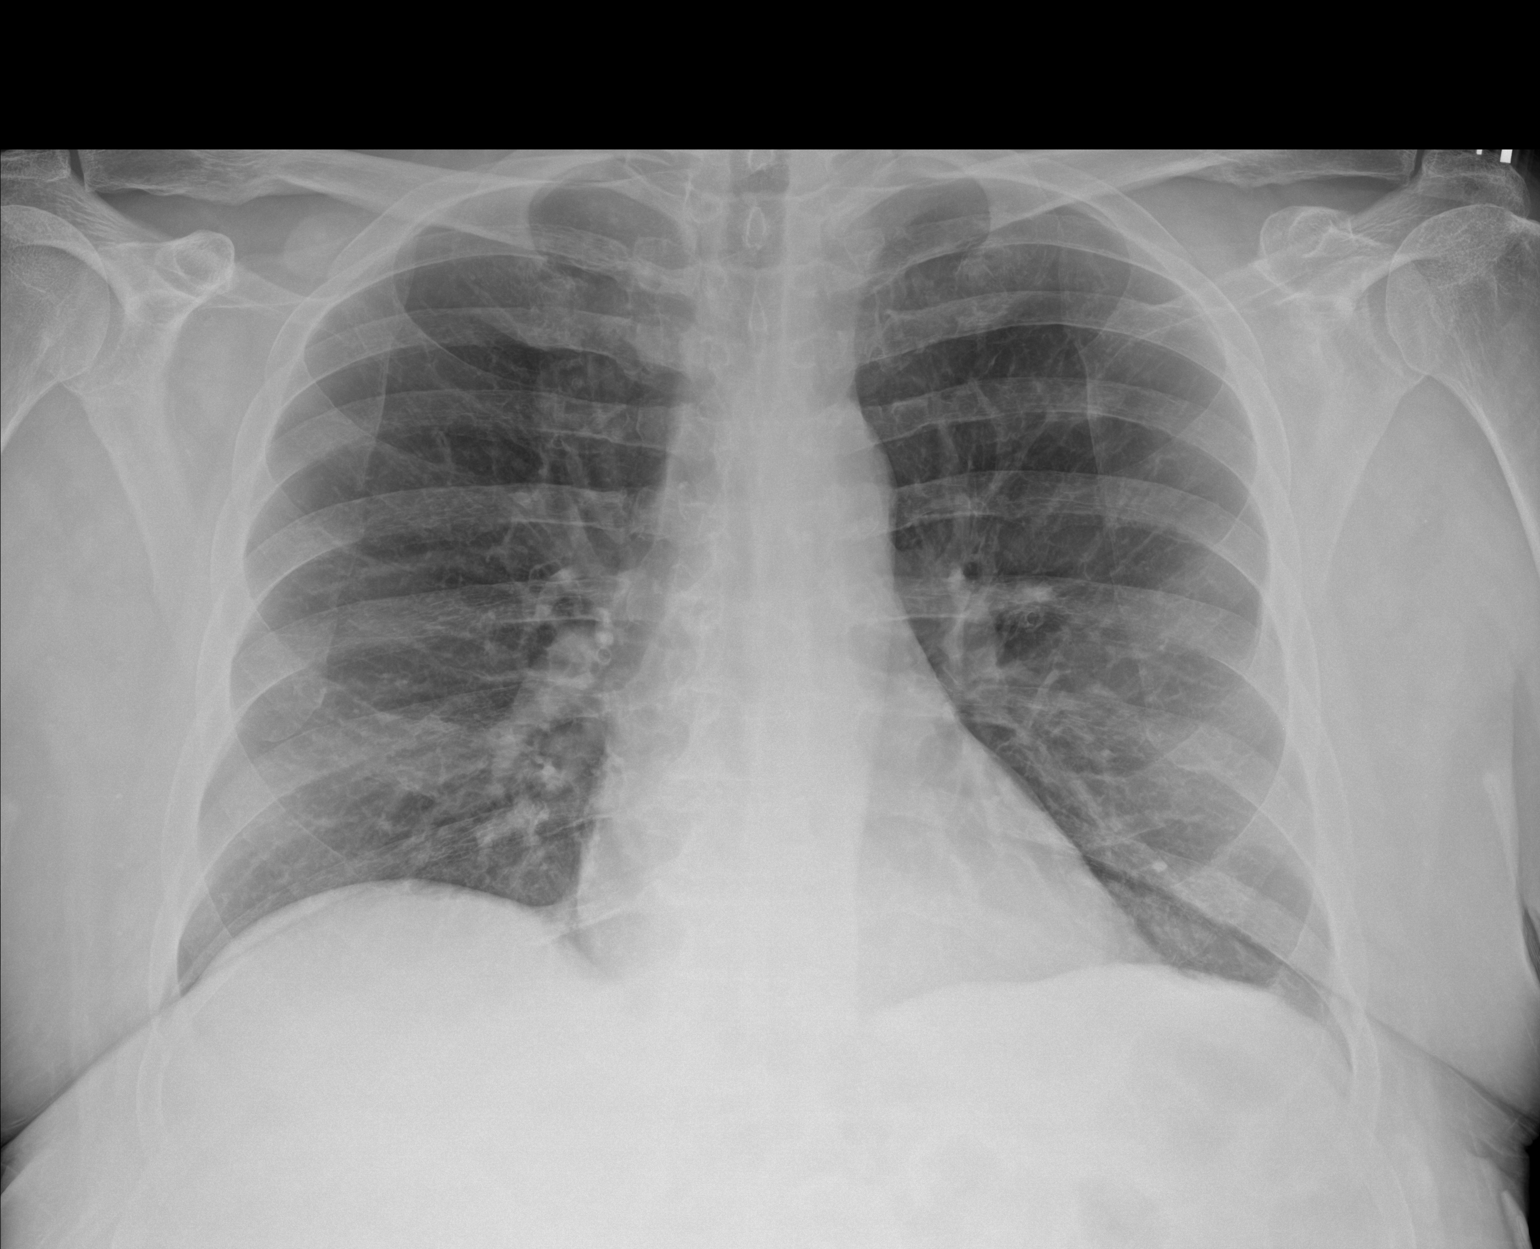

[1 of 1 positions shown; findings below may reference images not displayed]

FINDINGS: The heart size and mediastinal contours are within normal limits.
Both lungs are clear. No pleural effusion or pneumothorax. The
visualized skeletal structures are unremarkable.
IMPRESSION: No acute process in the chest.

## 2021-03-11 MED ORDER — MIRTAZAPINE 15 MG PO TABS: 15.0000 mg | ORAL_TABLET | Freq: Every day | ORAL | 0 refills | Status: DC

## 2021-03-11 NOTE — Consult Note (Signed)
[facility] Consult   Reason for Consult:  s/i Patient Identification: Miguel [name] MRN:  [id number] Principal Diagnosis: <principal problem not specified> Diagnosis:   Alcohol Use disorder Unspecified Anxiety disorder  Total Time spent with patient: 45 minutes   HPI:   Patient is well known to the emergency department due to numerous visits.  He usually presents with alcohol intoxication.  Patient reports a longstanding history of alcohol use.  Says that he drinks half to three fourths of the week, usually a half a pint.  Says that he does not eat when he drinks.  Planes that he does not sleep when he does not drink either.  Does that has been working on his [address] [facility] for a few weeks now but has not been able to fully reparent.  He was working on yesterday and became frustrated and ended up accidentally cutting himself; not on purpose.  Patient cogently indicates that he was not suicidal.  He does not recall being combative.  He is calm alert and oriented at this time.  Reports moderate depression and low frustration threshold-- becomes overwhelmed easily.  Says that he does not work and takes care of his wife who has severe MS.  Indicates that he needs to really fix the car so he can take his wife to appointments.  Does acknowledge having problems with alcohol and would like to get help but cannot be away from his wife.  Denies suicidal or homicidal ideations.  Denies symptoms of psychosis.  Contract for safety  Risk to Self:   Risk to Others:   Prior Inpatient Therapy:   Prior Outpatient Therapy:    Past Medical History:  Past Medical History:  Diagnosis Date  . Alcohol abuse   . Anxiety   . Asthma   . Family history of breast cancer   . GERD (gastroesophageal reflux disease)   . Gout   . Hx of colonic polyps   . Hypertension   . Kidney stone   . 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 OCD (obsessive compulsive disorder)   . Renal colic     Past  Surgical History:  Procedure Laterality Date  . CHOLECYSTECTOMY  2012  . COLONOSCOPY    . COLONOSCOPY WITH PROPOFOL N/A 05/28/2020   Procedure: COLONOSCOPY WITH PROPOFOL;  Surgeon: [name], MD;  Location[facility] ENDOSCOPY;  Service: Gastroenterology;  Laterality: N/A;  . EXTRACORPOREAL SHOCK WAVE LITHOTRIPSY Left 01/12/2019   Procedure: EXTRACORPOREAL SHOCK WAVE LITHOTRIPSY (ESWL);  Surgeon: Billey Co, MD;  Location: [facility] ORS;  Service: Urology;  Laterality: Left;  . UPPER GI ENDOSCOPY     Family History:  Family History  Problem Relation Age of Onset  . Alcohol abuse Father   . Breast cancer Mother 33   Social History:  Social History   Substance and Sexual Activity  Alcohol Use Yes  . Alcohol/week: 12.0 standard drinks  . Types: 12 Shots of liquor per week     Social History   Substance and Sexual Activity  Drug Use No    Social History   Socioeconomic History  . Marital status: Married    Spouse name: Not on file  . Number of children: Not on file  . Years of education: Not on file  . Highest education level: Not on file  Occupational History  . Not on file  Tobacco Use  . Smoking status: Former Smoker    Types: Cigarettes  . Smokeless tobacco: Never  Used  . Tobacco comment: quit 30 years ago  Vaping Use  . Vaping Use: Never used  Substance and Sexual Activity  . Alcohol use: Yes    Alcohol/week: 12.0 standard drinks    Types: 12 Shots of liquor per week  . Drug use: No  . Sexual activity: Not on file  Other Topics Concern  . Not on file  Social History Narrative   Lives at home with his wife and takes care of his wife. Independent at baseline      - Biggest strain is financial but doesn't think they could got get more help.      Patient expressed interest in possible financial assistance. Informed written consent obtained   Social Determinants of Health   Financial Resource Strain: High Risk  . Difficulty of Paying Living Expenses: Very hard   Food Insecurity: No Food Insecurity  . Worried About Charity fundraiser in the Last Year: Never true  . Ran Out of Food in the Last Year: Never true  Transportation Needs: No Transportation Needs  . Lack of Transportation (Medical): No  . Lack of Transportation (Non-Medical): No  Physical Activity: Sufficiently Active  . Days of Exercise per Week: 7 days  . Minutes of Exercise per Session: 60 min  Stress: Stress Concern Present  . Feeling of Stress : Very much  Social Connections: Socially Isolated  . Frequency of Communication with Friends and Family: Once a week  . Frequency of Social Gatherings with Friends and Family: Once a week  . Attends Religious Services: Never  . Active Member of Clubs or Organizations: No  . Attends Archivist Meetings: Never  . Marital Status: Married   Additional Social History:    Allergies:   Allergies  Allergen Reactions  . Percocet [Oxycodone-Acetaminophen] Nausea And Vomiting    Labs:  Results for orders placed or performed during the hospital encounter of 03/10/21 (from the past 48 hour(s))  Comprehensive metabolic panel     Status: Abnormal   Collection Time: 03/10/21  9:03 PM  Result Value Ref Range   Sodium 141 135 - 145 mmol/L   Potassium 4.0 3.5 - 5.1 mmol/L   Chloride 109 98 - 111 mmol/L   CO2 19 (L) 22 - 32 mmol/L   Glucose, Bld 108 (H) 70 - 99 mg/dL    Comment: Glucose reference range applies only to samples taken after fasting for at least 8 hours.   BUN 17 8 - 23 mg/dL   Creatinine, Ser 1.22 0.61 - 1.24 mg/dL   Calcium 8.9 8.9 - 10.3 mg/dL   Total Protein 7.2 6.5 - 8.1 g/dL   Albumin 4.1 3.5 - 5.0 g/dL   AST 16 15 - 41 U/L   ALT 14 0 - 44 U/L   Alkaline Phosphatase 69 38 - 126 U/L   Total Bilirubin 0.7 0.3 - 1.2 mg/dL   GFR, Estimated >60 >60 mL/min    Comment: (NOTE) Calculated using the CKD-EPI Creatinine Equation (2021)    Anion gap 13 5 - 15    Comment: Performed at [facility], [address] [place], [place] [address]  Ethanol     Status: Abnormal   Collection Time: 03/10/21  9:03 PM  Result Value Ref Range   Alcohol, Ethyl (B) 347 (HH) <10 mg/dL    Comment: CRITICAL RESULT CALLED TO, READ BACK BY AND VERIFIED WITH ALLY YOW RN 2140 03/10/21 HNM (NOTE) Lowest detectable limit for serum alcohol is 10  mg/dL.  For medical purposes only. Performed at [facility], [address] [place], [place] [address]   cbc     Status: Abnormal   Collection Time: 03/10/21  9:03 PM  Result Value Ref Range   WBC 8.9 4.0 - 10.5 K/uL   RBC 4.89 4.22 - 5.81 MIL/uL   Hemoglobin 13.2 13.0 - 17.0 g/dL   HCT 40.0 39.0 - 52.0 %   MCV 81.8 80.0 - 100.0 fL   MCH 27.0 26.0 - 34.0 pg   MCHC 33.0 30.0 - 36.0 g/dL   RDW 18.0 (H) 11.5 - 15.5 %   Platelets 187 150 - 400 K/uL   nRBC 0.0 0.0 - 0.2 %    Comment: Performed at [facility], [address], [place], [place] [address]  Resp Panel by RT-PCR (Flu A&B, Covid) Nasopharyngeal Swab     Status: None   Collection Time: 03/10/21  9:03 PM   Specimen: Nasopharyngeal Swab; Nasopharyngeal(NP) swabs in vial transport medium  Result Value Ref Range   SARS Coronavirus 2 by RT PCR NEGATIVE NEGATIVE    Comment: (NOTE) SARS-CoV-2 target nucleic acids are NOT DETECTED.  The SARS-CoV-2 RNA is generally detectable in upper respiratory specimens during the acute phase of infection. The lowest concentration of SARS-CoV-2 viral copies this assay can detect is 138 copies/mL. A negative result does not preclude SARS-Cov-2 infection and should not be used as the sole basis for treatment or other patient management decisions. A negative result may occur with  improper specimen collection/handling, submission of specimen other than nasopharyngeal swab, presence of viral mutation(s) within the areas targeted by this assay, and inadequate number of viral copies(<138 copies/mL). A negative result must be combined with clinical observations,  patient history, and epidemiological information. The expected result is Negative.  Fact Sheet for Patients:  EntrepreneurPulse.com.au  Fact Sheet for Healthcare Providers:  IncredibleEmployment.be  This test is no t yet approved or cleared by the [place] FDA and  has been authorized for detection and/or diagnosis of SARS-CoV-2 by FDA under an Emergency Use Authorization (EUA). This EUA will remain  in effect (meaning this test can be used) for the duration of the COVID-19 declaration under Section 564(b)(1) of the Act, 21 U.S.C.section 360bbb-3(b)(1), unless the authorization is terminated  or revoked sooner.       Influenza A by PCR NEGATIVE NEGATIVE   Influenza B by PCR NEGATIVE NEGATIVE    Comment: (NOTE) The Xpert Xpress SARS-CoV-2/FLU/RSV plus assay is intended as an aid in the diagnosis of influenza from Nasopharyngeal swab specimens and should not be used as a sole basis for treatment. Nasal washings and aspirates are unacceptable for Xpert Xpress SARS-CoV-2/FLU/RSV testing.  Fact Sheet for Patients: EntrepreneurPulse.com.au  Fact Sheet for Healthcare Providers: IncredibleEmployment.be  This test is not yet approved or cleared by the [place] FDA and has been authorized for detection and/or diagnosis of SARS-CoV-2 by FDA under an Emergency Use Authorization (EUA). This EUA will remain in effect (meaning this test can be used) for the duration of the COVID-19 declaration under Section 564(b)(1) of the Act, 21 U.S.C. section 360bbb-3(b)(1), unless the authorization is terminated or revoked.  Performed at [facility], [address] [place], [place] [address]   Urine Drug Screen, Qualitative     Status: Abnormal   Collection Time: 03/10/21  9:06 PM  Result Value Ref Range   Tricyclic, Ur Screen NONE DETECTED NONE DETECTED   Amphetamines, Ur Screen NONE DETECTED NONE  DETECTED   MDMA (Ecstasy)Ur Screen NONE DETECTED NONE DETECTED   Cocaine Metabolite,Ur [place] NONE DETECTED NONE DETECTED   Opiate, Ur Screen NONE DETECTED NONE DETECTED   Phencyclidine (PCP) Ur S NONE DETECTED NONE DETECTED   Cannabinoid 50 Ng, Ur [place] POSITIVE (A) NONE DETECTED   Barbiturates, Ur Screen NONE DETECTED NONE DETECTED   Benzodiazepine, Ur Scrn POSITIVE (A) NONE DETECTED   Methadone Scn, Ur NONE DETECTED NONE DETECTED    Comment: (NOTE) Tricyclics + metabolites, urine    Cutoff 1000 ng/mL Amphetamines + metabolites, urine  Cutoff 1000 ng/mL MDMA (Ecstasy), urine              Cutoff 500 ng/mL Cocaine Metabolite, urine          Cutoff 300 ng/mL Opiate + metabolites, urine        Cutoff 300 ng/mL Phencyclidine (PCP), urine         Cutoff 25 ng/mL Cannabinoid, urine                 Cutoff 50 ng/mL Barbiturates + metabolites, urine  Cutoff 200 ng/mL Benzodiazepine, urine              Cutoff 200 ng/mL Methadone, urine                   Cutoff 300 ng/mL  The urine drug screen provides only a preliminary, unconfirmed analytical test result and should not be used for non-medical purposes. Clinical consideration and professional judgment should be applied to any positive drug screen result due to possible interfering substances. A more specific alternate chemical method must be used in order to obtain a confirmed analytical result. Gas chromatography / mass spectrometry (GC/MS) is the preferred confirm atory method. Performed at [facility], [address] [place], [place] [address]     Current Facility-Administered Medications  Medication Dose Route Frequency Provider Last Rate Last Admin  . LORazepam (ATIVAN) injection 0-4 mg  0-4 mg Intravenous Q6H Duffy Bruce, MD       Or  . LORazepam (ATIVAN) tablet 0-4 mg  0-4 mg Oral Q6H Duffy Bruce, MD   1 mg at 03/10/21 2300  . [START ON 03/13/2021] LORazepam (ATIVAN) injection 0-4 mg  0-4 mg Intravenous Q12H Duffy Bruce, MD       Or  . Derrill Memo ON 03/13/2021] LORazepam (ATIVAN) tablet 0-4 mg  0-4 mg Oral Q12H Duffy Bruce, MD      . thiamine tablet 100 mg  100 mg Oral Daily Duffy Bruce, MD       Or  . thiamine (B-1) injection 100 mg  100 mg Intravenous Daily Duffy Bruce, MD       Current Outpatient Medications  Medication Sig Dispense Refill  . ADVAIR DISKUS 100-50 MCG/DOSE AEPB INHALE 1 PUFF 2 TIMES A DAY. RINSE MOUTH AND SPIT AFTER EACH USE. 180 each 3  . allopurinol (ZYLOPRIM) 300 MG tablet TAKE ONE TABLET BY MOUTH EVERY DAY 90 tablet 1  . ascorbic acid (VITAMIN C) 500 MG tablet Take by mouth.    . chlordiazePOXIDE (LIBRIUM) 5 MG capsule Take 6 caps (30 mg) 4x daily on day 1. Take 5 caps (25 mg) 4x daily on day 2. Take 4 caps (20 mg) 4x daily on day 3. Take 3 caps (15 mg) 4x daily on day 4. Take 2 caps (10 mg) 4x daily on day 5. Take 2 caps (10 mg) 3x daily on day 6. Take 1 cap (5 mg) 3x daily  on day 7. Take 1 cap (5 mg) twice daily on day 8. Take 1 cap (5 mg) in the evening on day 9. 92 capsule 0  . citalopram (CELEXA) 20 MG tablet Take 1 tablet (20 mg total) by mouth daily. 30 tablet 1  . fexofenadine-pseudoephedrine (ALLEGRA-D) 60-120 MG 12 hr tablet Take 1 tablet by mouth 2 (two) times daily. 20 tablet 0  . Fluticasone-Salmeterol (ADVAIR DISKUS) 100-50 MCG/DOSE AEPB Inhale 1 puff into the lungs 2 (two) times daily. 741 each 0  . folic acid (FOLVITE) 1 MG tablet TAKE ONE TABLET BY MOUTH EVERY DAY 30 tablet 1  . lisinopril (ZESTRIL) 40 MG tablet TAKE ONE TABLET BY MOUTH EVERY DAY 90 tablet 1  . LORazepam (ATIVAN) 1 MG tablet Take 1 tablet (1 mg total) by mouth every 8 (eight) hours as needed for anxiety. 15 tablet 0  . metoprolol tartrate (LOPRESSOR) 25 MG tablet Take 1 tablet (25 mg total) by mouth daily. 30 tablet 0  . pantoprazole (PROTONIX) 40 MG tablet TAKE 1 TABLET (40 MG TOTAL) BY MOUTH DAILY. 90 tablet 1  . sulfamethoxazole-trimethoprim (BACTRIM DS) 800-160 MG tablet TAKE ONE TABLET  BY MOUTH 2 TIMES A DAY 20 tablet 0  . thiamine (VITAMIN B-1) 100 MG tablet TAKE ONE TABLET BY MOUTH EVERY DAY 90 tablet 0  . thiamine 100 MG tablet Take 1 tablet (100 mg total) by mouth daily. 90 tablet 0  . VENTOLIN HFA 108 (90 Base) MCG/ACT inhaler INHALE 2 PUFFS EVERY 4 HOURS AS NEEDED 72 g 0  . VENTOLIN HFA 108 (90 Base) MCG/ACT inhaler INHALE 2 PUFFS EVERY 4 HOURS AS NEEDED 72 g 1      Psychiatric Specialty Exam:  Presentation General Appearance:Appropriate for Environment  Eye Contact:Good  Speech:good  Speech Volume:Normal  Handedness:Right   Mood and Affect Mood:fine restricted Affect:Congruent   Thought Process Thought Processes:Disorganized  Descriptions of Associations:Circumstantial  Orientation:Full (Time, Place and Person)  Thought Content:wnl  History of Schizophrenia/Schizoaffective disorder:Yes  Duration of Psychotic Symptoms:Greater than six months  Hallucinations:Hallucinations: Auditory Description of Auditory Hallucinations: hears voices (non command)  Ideas of Reference:None  Suicidal Thoughts:Suicidal Thoughts: No  Homicidal Thoughts:Homicidal Thoughts: No   Sensorium Memory:Immediate Fair  Judgment: better  Insight:fair   Executive Functions Concentration:Fair  Attention Span:Fair  [place]   Psychomotor Activity Psychomotor Activity:Psychomotor Activity: Other (comment); Decreased (unable to assess) Physical Exam: Physical Exam ROS Blood pressure (!) 151/91, pulse 97, temperature 98.2 F (36.8 C), temperature source Oral, resp. rate 14, height 6' (1.829 m), weight 80 kg, SpO2 100 %. Body mass index is 23.92 kg/m.  Treatment Plan Summary:   Start Remeron 15 mg p.o. nightly for anxiety/frustration as well as his moderate depression.  Risk-benefit side effects and alternatives reviewed.  Patient was agreement standing Will provide  outpatient resources for chemical dependency if he chooses to go through with this Could consider naltrexone once patient is more open to quitting alcohol. Asked to follow up with his primary care provider which she currently has.  Disposition: No evidence of imminent risk to self or others at present.    Rulon Sera, MD 03/11/2021 8:45 AM

## 2021-03-11 NOTE — ED Notes (Signed)
Papers rescinded pending disposition

## 2021-03-11 NOTE — ED Provider Notes (Signed)
Patient has been seen and examined by psychiatry, he has been taken off involuntary commitment.  Psychiatry physician advising the patient may be discharged, and he will be providing him a additional medications.  Also being provided outpatient resources  Vitals:   03/10/21 2036  BP: (!) 151/91  Pulse: 97  Resp: 14  Temp: 98.2 F (36.8 C)  SpO2: 100%    Patient resting in no distress.  Appears appropriate for discharge.  He is not driving himself   Delman Kitten, MD 03/11/21 1039

## 2021-03-11 NOTE — ED Notes (Signed)
IVC prior to arrival/ Consult ordered

## 2021-03-11 NOTE — ED Notes (Addendum)
Pt given fresh water.

## 2021-03-11 NOTE — Progress Notes (Signed)
Miguel [name] is a 61 y.o.male here with suicidal ideation. The patient arrives with police in severe distress. The patient was medicated, therefore, making it difficult to assess him.

## 2021-03-11 NOTE — Discharge Instructions (Signed)
No driving today or while using alcohol.

## 2021-03-11 NOTE — ED Notes (Signed)
Patient's wife updated per patient request. 

## 2021-03-11 NOTE — ED Provider Notes (Signed)
Emergency Medicine Observation Re-evaluation Note  Miguel [name] is a 60 y.o. male, seen on rounds today.  Pt initially presented to the ED for complaints of Suicidal Currently, the patient is resting comfortably.  Physical Exam  BP (!) 151/91 (BP Location: Right Arm)   Pulse 97   Temp 98.2 F (36.8 C) (Oral)   Resp 14   Ht 6' (1.829 m)   Wt 80 kg   SpO2 100%   BMI 23.92 kg/m  Physical Exam Gen: No acute distress  Resp: Normal rise and fall of chest Neuro: Moving all four extremities Psych: Resting currently, calm and cooperative when awake    ED Course / MDM  EKG:   I have reviewed the labs performed to date as well as medications administered while in observation.  Recent changes in the last 24 hours include no acute events overnight.  Plan  Current plan is for Psychiatric evaluation for further disposition. Patient is under full IVC at this time.   Jatniel Verastegui, Delice Bison, DO 03/11/21 [phone]

## 2021-03-11 NOTE — ED Notes (Signed)
IVC/ Psych Consult pending due to being medicated

## 2021-03-12 IMAGING — CR DG ANKLE COMPLETE 3+V*R*
3 series · 3 of 3 positions shown · non-contrast
Comparison: None.

CLINICAL DATA: 58-year-old male with fall and trauma to the right
ankle.

EXAM:
RIGHT ANKLE - COMPLETE 3+ VIEW

[ankle ap]
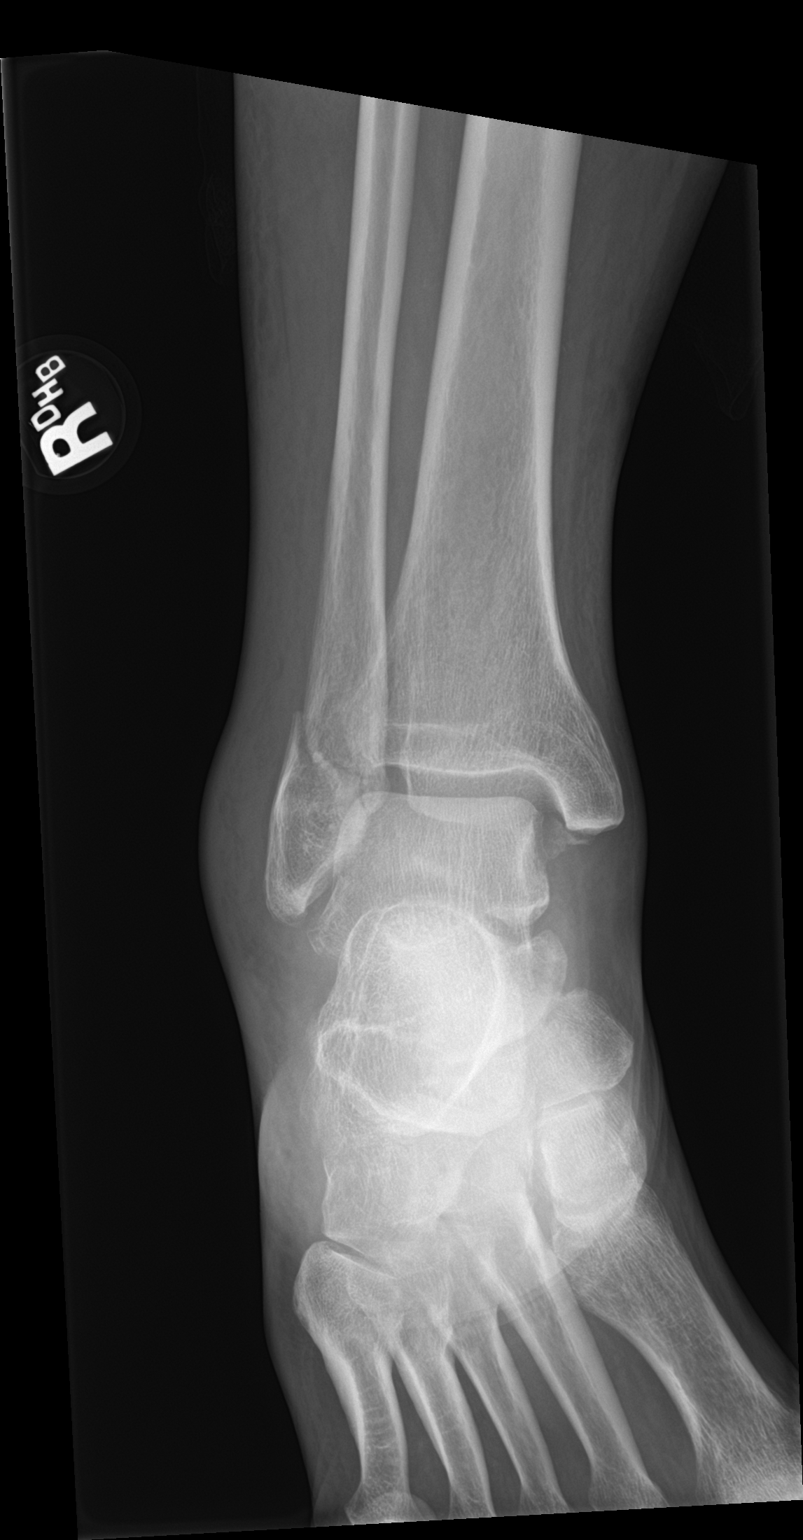

[ankle obl]
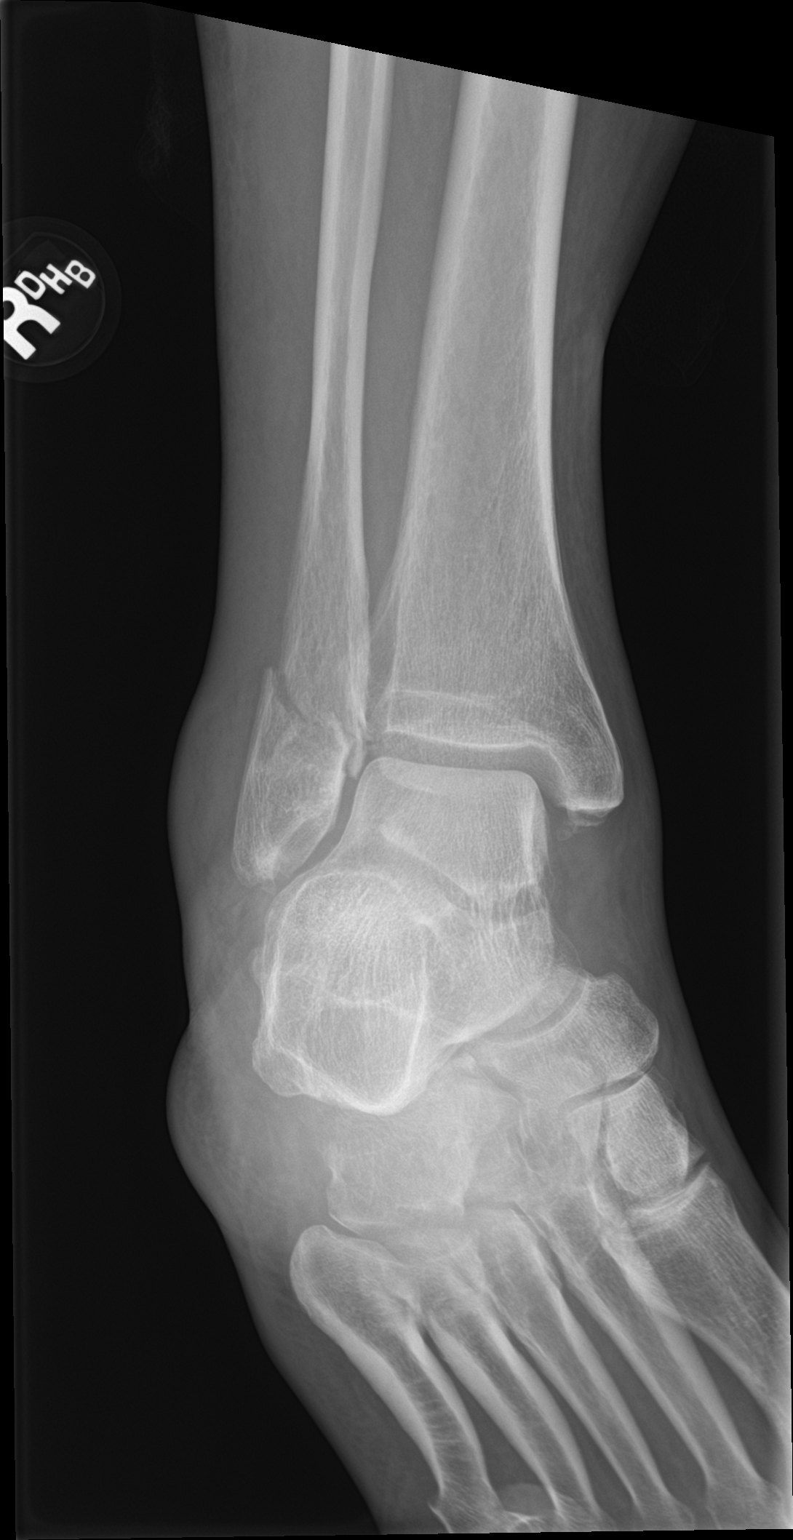

[ankle lat]
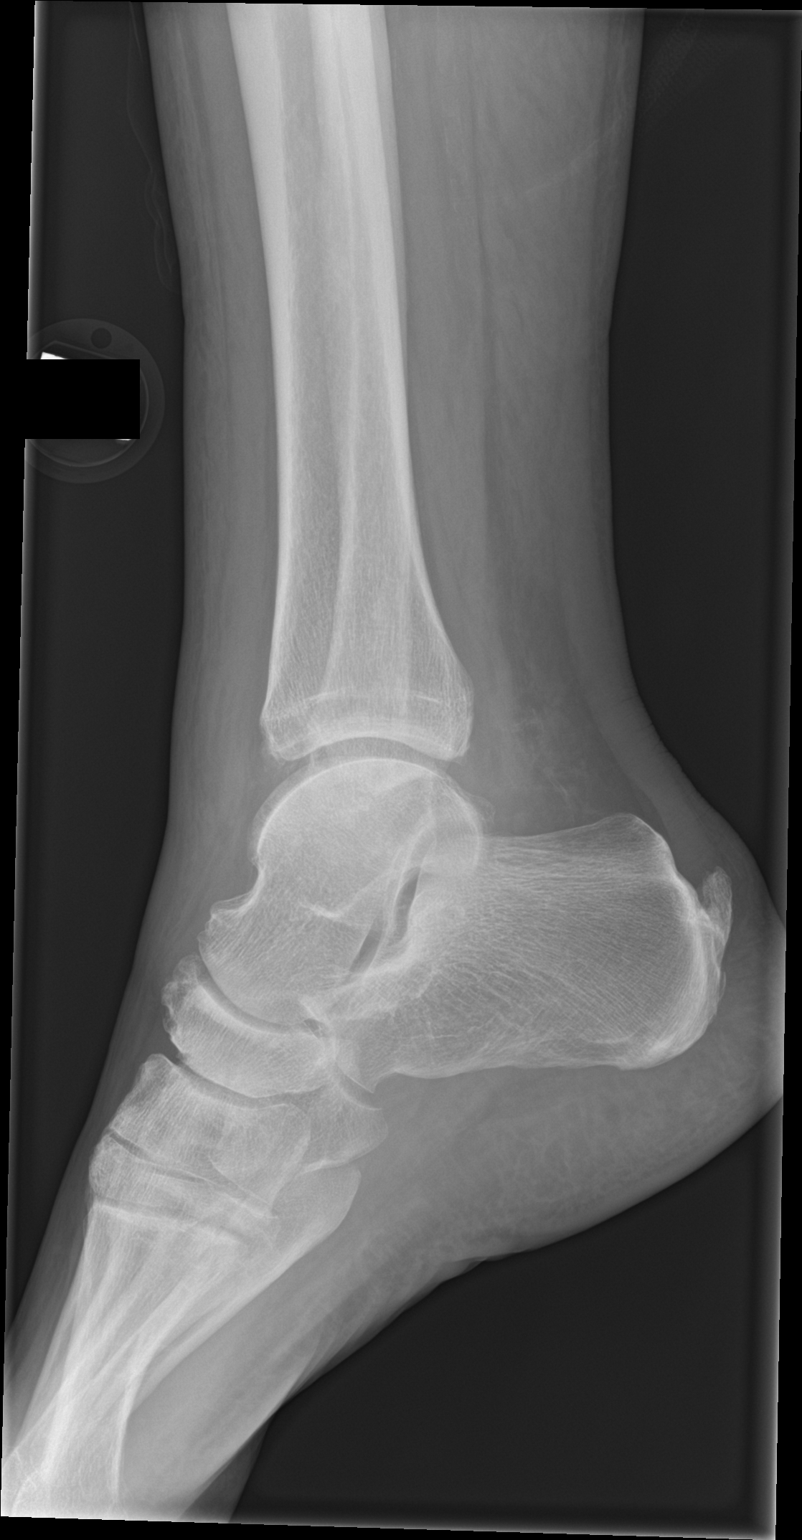

[3 of 3 positions shown; findings below may reference images not displayed]

FINDINGS: Mildly displaced fractures of the lateral and medial malleoli with
approximately 2-3 mm lateral displacement of the distal fracture
fragment. There is approximately 2 mm lateral subluxation of the
ankle mortise. The bones are osteopenic. There is soft tissue
swelling of the ankle.
IMPRESSION: Minimally displaced bimalleolar fractures.

## 2021-03-19 ENCOUNTER — Other Ambulatory Visit: Payer: Self-pay

## 2021-03-19 ENCOUNTER — Ambulatory Visit: Payer: Medicaid Other | Admitting: Gerontology

## 2021-03-19 ENCOUNTER — Encounter: Payer: Self-pay | Admitting: Gerontology

## 2021-03-19 VITALS — BP 160/90 | HR 81 | Temp 97.4°F | Resp 16 | Ht 71.0 in | Wt 191.8 lb

## 2021-03-19 DIAGNOSIS — F411 Generalized anxiety disorder: Secondary | ICD-10-CM

## 2021-03-19 DIAGNOSIS — I158 Other secondary hypertension: Secondary | ICD-10-CM

## 2021-03-19 MED ORDER — MIRTAZAPINE 15 MG PO TABS
15.0000 mg | ORAL_TABLET | Freq: Every day | ORAL | 0 refills | Status: DC
  Filled 2021-03-19: qty 30, 30d supply, fill #0

## 2021-03-19 MED ORDER — METOPROLOL TARTRATE 25 MG PO TABS
25.0000 mg | ORAL_TABLET | Freq: Every day | ORAL | 0 refills | Status: DC
  Filled 2021-03-19: qty 30, 30d supply, fill #0

## 2021-03-19 NOTE — Progress Notes (Signed)
Established Patient Office Visit  Subjective:  Patient ID: Miguel [name], male    DOB: [birth year]  Age: 60 y.o. MRN: [id number]  CC:  Chief Complaint  Patient presents with  . Hospitalization Follow-up  . Establish Care    HPI SANJITH SIWEK a 60 y/o male who has a history of Alcohol abuse, Anxiety, Asthma, [name], Gout and Hypertension, presents for follow up after hospital discharge. Since his last visit in January, he has made multiple trips to the ED. During his last visit on 03/10/21, he was treated for alcohol intoxication and suicidal ideation. He states that he was started on 15 mg Mirtazapine last week and it's effective in curbing his cravings. He denies any alcoholic beverage consumption since he started taking Mirtazapine, denies peripheral tremors, states that his mind is focused and he's able to drive and sleep better. He reports that he's in a good mood, denies suicidal nor homicidal ideation. He's unable to follow up with [facility] because he is his wife's care giver. His blood pressure was elevated during visit, and he reports checking it daily and reports that it has been elevated for the past 3 days. He denies chest pain, palpitation, vision changes and light headedness. He is compliant with his medication, but states that he ws out of his  25 mg of Metoprolol . Overall, he states that he's doing well and offers no further complaint.   Past Medical History:  Diagnosis Date  . Alcohol abuse   . Alcohol abuse   . Anxiety   . Asthma   . Family history of breast cancer   . GERD (gastroesophageal reflux disease)   . Gout   . Hx of colonic polyps   . Hypertension   . Kidney stone   . 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 OCD (obsessive compulsive disorder)   . Renal colic     Past Surgical History:  Procedure Laterality Date  . CHOLECYSTECTOMY  2012  . COLONOSCOPY    . COLONOSCOPY WITH PROPOFOL N/A 05/28/2020   Procedure: COLONOSCOPY WITH PROPOFOL;  Surgeon:  [name], MD;  Location: [facility]COPY;  Service: Gastroenterology;  Laterality: N/A;  . EXTRACORPOREAL SHOCK WAVE LITHOTRIPSY Left 01/12/2019   Procedure: EXTRACORPOREAL SHOCK WAVE LITHOTRIPSY (ESWL);  Surgeon: Billey Co, MD;  Location: [facility] ORS;  Service: Urology;  Laterality: Left;  . UPPER GI ENDOSCOPY      Family History  Problem Relation Age of Onset  . Alcohol abuse Father   . Breast cancer Mother 45  . Anxiety disorder Brother   . Other Maternal Grandmother        unknown medical history  . Other Maternal Grandfather        unknow medical history  . Other Paternal Grandmother        unknown medical history  . Other Paternal Grandfather        unknown medical history  . Healthy Brother     Social History   Socioeconomic History  . Marital status: Married    Spouse name: Not on file  . Number of children: Not on file  . Years of education: Not on file  . Highest education level: Not on file  Occupational History  . Not on file  Tobacco Use  . Smoking status: Former Smoker    Types: Cigarettes    Quit date: 03/19/1981    Years since quitting: 40.0  . Smokeless tobacco: Never Used  Vaping Use  .  Vaping Use: Never used  Substance and Sexual Activity  . Alcohol use: Yes    Alcohol/week: 12.0 standard drinks    Types: 12 Shots of liquor per week    Comment: last use 03/11/21 (since last hospitilization)  . Drug use: No  . Sexual activity: Not Currently  Other Topics Concern  . Not on file  Social History Narrative   Lives at home with his wife and takes care of his wife. Independent at baseline      - Biggest strain is financial but doesn't think they could got get more help.      Patient expressed interest in possible financial assistance. Informed written consent obtained   Social Determinants of Health   Financial Resource Strain: High Risk  . Difficulty of Paying Living Expenses: Very hard  Food Insecurity: No Food Insecurity  . Worried About  Charity fundraiser in the Last Year: Never true  . Ran Out of Food in the Last Year: Never true  Transportation Needs: No Transportation Needs  . Lack of Transportation (Medical): No  . Lack of Transportation (Non-Medical): No  Physical Activity: Sufficiently Active  . Days of Exercise per Week: 7 days  . Minutes of Exercise per Session: 60 min  Stress: Stress Concern Present  . Feeling of Stress : Very much  Social Connections: Socially Isolated  . Frequency of Communication with Friends and Family: Once a week  . Frequency of Social Gatherings with Friends and Family: Once a week  . Attends Religious Services: Never  . Active Member of Clubs or Organizations: No  . Attends Archivist Meetings: Never  . Marital Status: Married  Human resources officer Violence: Not At Risk  . Fear of Current or Ex-Partner: No  . Emotionally Abused: No  . Physically Abused: No  . Sexually Abused: No    Outpatient Medications Prior to Visit  Medication Sig Dispense Refill  . ADVAIR DISKUS 100-50 MCG/DOSE AEPB INHALE 1 PUFF 2 TIMES A DAY. RINSE MOUTH AND SPIT AFTER EACH USE. 180 each 3  . allopurinol (ZYLOPRIM) 300 MG tablet TAKE ONE TABLET BY MOUTH EVERY DAY 90 tablet 1  . ascorbic acid (VITAMIN C) 500 MG tablet Take by mouth.    . folic acid (FOLVITE) 1 MG tablet TAKE ONE TABLET BY MOUTH EVERY DAY 30 tablet 1  . lisinopril (ZESTRIL) 40 MG tablet TAKE ONE TABLET BY MOUTH EVERY DAY 90 tablet 1  . loratadine (CLARITIN) 10 MG tablet Take 10 mg by mouth daily.    . pantoprazole (PROTONIX) 40 MG tablet TAKE 1 TABLET (40 MG TOTAL) BY MOUTH DAILY. 90 tablet 1  . VENTOLIN HFA 108 (90 Base) MCG/ACT inhaler INHALE 2 PUFFS EVERY 4 HOURS AS NEEDED 72 g 1  . mirtazapine (REMERON) 15 MG tablet Take 1 tablet (15 mg total) by mouth at bedtime. 30 tablet 0  . thiamine (VITAMIN B-1) 100 MG tablet TAKE ONE TABLET BY MOUTH EVERY DAY 90 tablet 0  . chlordiazePOXIDE (LIBRIUM) 5 MG capsule Take 6 caps (30 mg) 4x  daily on day 1. Take 5 caps (25 mg) 4x daily on day 2. Take 4 caps (20 mg) 4x daily on day 3. Take 3 caps (15 mg) 4x daily on day 4. Take 2 caps (10 mg) 4x daily on day 5. Take 2 caps (10 mg) 3x daily on day 6. Take 1 cap (5 mg) 3x daily on day 7. Take 1 cap (5 mg) twice daily on day 8. Take  1 cap (5 mg) in the evening on day 9. 92 capsule 0  . citalopram (CELEXA) 20 MG tablet Take 1 tablet (20 mg total) by mouth daily. (Patient not taking: Reported on 03/19/2021) 30 tablet 1  . fexofenadine-pseudoephedrine (ALLEGRA-D) 60-120 MG 12 hr tablet Take 1 tablet by mouth 2 (two) times daily. 20 tablet 0  . Fluticasone-Salmeterol (ADVAIR DISKUS) 100-50 MCG/DOSE AEPB Inhale 1 puff into the lungs 2 (two) times daily. 180 each 0  . LORazepam (ATIVAN) 1 MG tablet Take 1 tablet (1 mg total) by mouth every 8 (eight) hours as needed for anxiety. (Patient not taking: Reported on 03/19/2021) 15 tablet 0  . metoprolol tartrate (LOPRESSOR) 25 MG tablet Take 1 tablet (25 mg total) by mouth daily. 30 tablet 0  . sulfamethoxazole-trimethoprim (BACTRIM DS) 800-160 MG tablet TAKE ONE TABLET BY MOUTH 2 TIMES A DAY 20 tablet 0  . thiamine 100 MG tablet Take 1 tablet (100 mg total) by mouth daily. 90 tablet 0  . VENTOLIN HFA 108 (90 Base) MCG/ACT inhaler INHALE 2 PUFFS EVERY 4 HOURS AS NEEDED 72 g 0   No facility-administered medications prior to visit.    Allergies  Allergen Reactions  . Percocet [Oxycodone-Acetaminophen] Nausea And Vomiting    ROS Review of Systems  Constitutional: Negative.   Eyes: Negative.   Respiratory: Negative.   Cardiovascular: Negative.   Gastrointestinal: Negative.   Neurological: Negative.   Psychiatric/Behavioral: Negative.       Objective:    Physical Exam HENT:     Head: Normocephalic and atraumatic.  Eyes:     Extraocular Movements: Extraocular movements intact.     Conjunctiva/sclera: Conjunctivae normal.     Pupils: Pupils are equal, round, and reactive to light.   Cardiovascular:     Rate and Rhythm: Normal rate and regular rhythm.     Pulses: Normal pulses.     Heart sounds: Normal heart sounds.  Pulmonary:     Effort: Pulmonary effort is normal.     Breath sounds: Normal breath sounds.  Skin:    General: Skin is warm.  Neurological:     General: No focal deficit present.     Mental Status: He is alert and oriented to person, place, and time. Mental status is at baseline.  Psychiatric:        Mood and Affect: Mood normal.        Behavior: Behavior normal.        Thought Content: Thought content normal.        Judgment: Judgment normal.     BP (!) 160/90 (BP Location: Right Arm, Patient Position: Sitting, Cuff Size: Normal)   Pulse 81   Temp (!) 97.4 F (36.3 C)   Resp 16   Ht 5' 11"  (1.803 m)   Wt 191 lb 12.8 oz (87 kg)   SpO2 98%   BMI 26.75 kg/m  Wt Readings from Last 3 Encounters:  03/19/21 191 lb 12.8 oz (87 kg)  03/10/21 176 lb 5.9 oz (80 kg)  03/05/21 176 lb 5.9 oz (80 kg)     Health Maintenance Due  Topic Date Due  . Hepatitis C Screening  Never done  . COVID-19 Vaccine (1) Never done    There are no preventive care reminders to display for this patient.  Lab Results  Component Value Date   TSH 0.052 (L) 11/29/2020   Lab Results  Component Value Date   WBC 8.9 03/10/2021   HGB 13.2 03/10/2021   HCT 40.0  03/10/2021   MCV 81.8 03/10/2021   PLT 187 03/10/2021   Lab Results  Component Value Date   NA 141 03/10/2021   K 4.0 03/10/2021   CO2 19 (L) 03/10/2021   GLUCOSE 108 (H) 03/10/2021   BUN 17 03/10/2021   CREATININE 1.22 03/10/2021   BILITOT 0.7 03/10/2021   ALKPHOS 69 03/10/2021   AST 16 03/10/2021   ALT 14 03/10/2021   PROT 7.2 03/10/2021   ALBUMIN 4.1 03/10/2021   CALCIUM 8.9 03/10/2021   ANIONGAP 13 03/10/2021   Lab Results  Component Value Date   CHOL 155 05/22/2020   Lab Results  Component Value Date   HDL 65 05/22/2020   Lab Results  Component Value Date   LDLCALC 73 05/22/2020    Lab Results  Component Value Date   TRIG 92 05/22/2020   Lab Results  Component Value Date   CHOLHDL 2.4 05/22/2020   Lab Results  Component Value Date   HGBA1C 5.2 11/29/2020      Assessment & Plan:    1. Generalized anxiety disorder - He will continue on current medication regimen, and will follow up with [facility] Ms. Pruitt H. He was advised to call the Crisis help line with worsening symptoms - mirtazapine (REMERON) 15 MG tablet; Take 1 tablet (15 mg total) by mouth at bedtime.  Dispense: 30 tablet; Refill: 0  2. Other secondary hypertension - His blood pressure is not under control, he will continue on Metoprolol, advised to check blood pressure, record and bring machine to follow up appointment. He will continue on DASH diet and exercise as tolerated. - metoprolol tartrate (LOPRESSOR) 25 MG tablet; Take 1 tablet (25 mg total) by mouth daily.  Dispense: 30 tablet; Refill: 0    Follow-up: Return in about 4 weeks (around 04/16/2021), or if symptoms worsen or fail to improve.    Dorine Duffey Jerold Coombe, NP

## 2021-03-19 NOTE — Patient Instructions (Signed)
Cooking With Less Salt Cooking with less salt is one way to reduce the amount of sodium you get from food. Sodium is one of the elements that make up salt. It is found naturally in foods and is also added to certain foods. Depending on your condition and overall health, your health care provider or dietitian may recommend that you reduce your sodium intake. Most people should have less than 2,300 milligrams (mg) of sodium each day. If you have high blood pressure (hypertension), you may need to limit your sodium to 1,500 mg each day. Follow the tips below to help reduce your sodium intake. What are tips for eating less sodium? Reading food labels  Check the food label before buying or using packaged ingredients. Always check the label for the serving size and sodium content.  Look for products with no more than 140 mg of sodium in one serving.  Check the % Daily Value column to see what percent of the daily recommended amount of sodium is provided in one serving of the product. Foods with 5% or less in this column are considered low in sodium. Foods with 20% or higher are considered high in sodium.  Do not choose foods with salt as one of the first three ingredients on the ingredients list. If salt is one of the first three ingredients, it usually means the item is high in sodium.   Shopping  Buy sodium-free or low-sodium products. Look for the following words on food labels: ? Low-sodium. ? Sodium-free. ? Reduced-sodium. ? No salt added. ? Unsalted.  Always check the sodium content even if foods are labeled as low-sodium or no salt added.  Buy fresh foods. Cooking  Use herbs, seasonings without salt, and spices as substitutes for salt.  Use sodium-free baking soda when baking.  Grill, braise, or roast foods to add flavor with less salt.  Avoid adding salt to pasta, rice, or hot cereals.  Drain and rinse canned vegetables, beans, and meat before use.  Avoid adding salt when  cooking sweets and desserts.  Cook with low-sodium ingredients. What foods are high in sodium? Vegetables Regular canned vegetables (not low-sodium or reduced-sodium). Sauerkraut, pickled vegetables, and relishes. Olives. French fries. Onion rings. Regular canned tomato sauce and paste. Regular tomato and vegetable juice. Frozen vegetables in sauces. Grains Instant hot cereals. Bread stuffing, pancake, and biscuit mixes. Croutons. Seasoned rice or pasta mixes. Noodle soup cups. Boxed or frozen macaroni and cheese. Regular salted crackers. Self-rising flour. Rolls. Bagels. Flour tortillas and wraps. Meats and other proteins Meat or fish that is salted, canned, smoked, cured, spiced, or pickled. This includes bacon, ham, sausages, hot dogs, corned beef, chipped beef, meat loaves, salt pork, jerky, pickled herring, anchovies, regular canned tuna, and sardines. Salted nuts. Dairy Processed cheese and cheese spreads. Cheese curds. Blue cheese. Feta cheese. String cheese. Regular cottage cheese. Buttermilk. Canned milk. The items listed above may not be a complete list of foods high in sodium. Actual amounts of sodium may be different depending on processing. Contact a dietitian for more information. What foods are low in sodium? Fruits Fresh, frozen, or canned fruit with no sauce added. Fruit juice. Vegetables Fresh or frozen vegetables with no sauce added. "No salt added" canned vegetables. "No salt added" tomato sauce and paste. Low-sodium or reduced-sodium tomato and vegetable juice. Grains Noodles, pasta, quinoa, rice. Shredded or puffed wheat or puffed rice. Regular or quick oats (not instant). Low-sodium crackers. Low-sodium bread. Whole-grain bread and whole-grain pasta. Unsalted popcorn.   Meats and other proteins Fresh or frozen whole meats, poultry (not injected with sodium), and fish with no sauce added. Unsalted nuts. Dried peas, beans, and lentils without added salt. Unsalted canned beans.  Eggs. Unsalted nut butters. Low-sodium canned tuna or chicken. Dairy Milk. Soy milk. Yogurt. Low-sodium cheeses, such as Swiss, Monterey Jack, mozzarella, and ricotta. Sherbet or ice cream (keep to  cup per serving). Cream cheese. Fats and oils Unsalted butter or margarine. Other foods Homemade pudding. Sodium-free baking soda and baking powder. Herbs and spices. Low-sodium seasoning mixes. Beverages Coffee and tea. Carbonated beverages. The items listed above may not be a complete list of foods low in sodium. Actual amounts of sodium may be different depending on processing. Contact a dietitian for more information. What are some salt alternatives when cooking? The following are herbs, seasonings, and spices that can be used instead of salt to flavor your food. Herbs should be fresh or dried. Do not choose packaged mixes. Next to the name of the herb, spice, or seasoning are some examples of foods you can pair it with. Herbs  Bay leaves - Soups, meat and vegetable dishes, and spaghetti sauce.  Basil - Italian dishes, soups, pasta, and fish dishes.  Cilantro - Meat, poultry, and vegetable dishes.  Chili powder - Marinades and Mexican dishes.  Chives - Salad dressings and potato dishes.  Cumin - Mexican dishes, couscous, and meat dishes.  Dill - Fish dishes, sauces, and salads.  Fennel - Meat and vegetable dishes, breads, and cookies.  Garlic (do not use garlic salt) - Italian dishes, meat dishes, salad dressings, and sauces.  Marjoram - Soups, potato dishes, and meat dishes.  Oregano - Pizza and spaghetti sauce.  Parsley - Salads, soups, pasta, and meat dishes.  Rosemary - Italian dishes, salad dressings, soups, and red meats.  Saffron - Fish dishes, pasta, and some poultry dishes.  Sage - Stuffings and sauces.  Tarragon - Fish and poultry dishes.  Thyme - Stuffing, meat, and fish dishes. Seasonings  Lemon juice - Fish dishes, poultry dishes, vegetables, and  salads.  Vinegar - Salad dressings, vegetables, and fish dishes. Spices  Cinnamon - Sweet dishes, such as cakes, cookies, and puddings.  Cloves - Gingerbread, puddings, and marinades for meats.  Curry - Vegetable dishes, fish and poultry dishes, and stir-fry dishes.  Ginger - Vegetable dishes, fish dishes, and stir-fry dishes.  Nutmeg - Pasta, vegetables, poultry, fish dishes, and custard. Summary  Cooking with less salt is one way to reduce the amount of sodium that you get from food.  Buy sodium-free or low-sodium products.  Check the food label before using or buying packaged ingredients.  Use herbs, seasonings without salt, and spices as substitutes for salt in foods. This information is not intended to replace advice given to you by your health care provider. Make sure you discuss any questions you have with your health care provider. Document Revised: 10/25/2019 Document Reviewed: 10/25/2019 Elsevier Patient Education  2021 [employer]  

## 2021-03-20 ENCOUNTER — Other Ambulatory Visit: Payer: Self-pay

## 2021-03-20 MED FILL — Fluticasone-Salmeterol Aer Powder BA 100-50 MCG/ACT: RESPIRATORY_TRACT | 90 days supply | Qty: 180 | Fill #0 | Status: AC

## 2021-03-21 ENCOUNTER — Other Ambulatory Visit: Payer: Self-pay

## 2021-03-21 ENCOUNTER — Other Ambulatory Visit: Payer: Self-pay | Admitting: Gerontology

## 2021-03-21 DIAGNOSIS — F1010 Alcohol abuse, uncomplicated: Secondary | ICD-10-CM

## 2021-03-21 MED ORDER — FOLIC ACID 1 MG PO TABS
ORAL_TABLET | Freq: Every day | ORAL | 1 refills | Status: DC
  Filled 2021-03-21: qty 30, 30d supply, fill #0

## 2021-03-25 ENCOUNTER — Emergency Department
Admission: EM | Admit: 2021-03-25 | Discharge: 2021-03-25 | Disposition: A | Discharge status: Home / Self Care | Payer: Medicaid Other | Attending: Emergency Medicine | Admitting: Emergency Medicine

## 2021-03-25 DIAGNOSIS — Z87891 Personal history of nicotine dependence: Secondary | ICD-10-CM | POA: Insufficient documentation

## 2021-03-25 DIAGNOSIS — R109 Unspecified abdominal pain: Secondary | ICD-10-CM

## 2021-03-25 DIAGNOSIS — R1030 Lower abdominal pain, unspecified: Secondary | ICD-10-CM | POA: Insufficient documentation

## 2021-03-25 DIAGNOSIS — Z79899 Other long term (current) drug therapy: Secondary | ICD-10-CM | POA: Insufficient documentation

## 2021-03-25 DIAGNOSIS — J45909 Unspecified asthma, uncomplicated: Secondary | ICD-10-CM | POA: Insufficient documentation

## 2021-03-25 DIAGNOSIS — Z7951 Long term (current) use of inhaled steroids: Secondary | ICD-10-CM | POA: Insufficient documentation

## 2021-03-25 DIAGNOSIS — I10 Essential (primary) hypertension: Secondary | ICD-10-CM | POA: Insufficient documentation

## 2021-03-25 LAB — CBC WITH DIFFERENTIAL/PLATELET
Abs Immature Granulocytes: 0.02 10*3/uL (ref 0.00–0.07)
Basophils Absolute: 0.1 10*3/uL (ref 0.0–0.1)
Basophils Relative: 1 %
Eosinophils Absolute: 0.3 10*3/uL (ref 0.0–0.5)
Eosinophils Relative: 4 %
HCT: 40.2 % (ref 39.0–52.0)
Hemoglobin: 13.1 g/dL (ref 13.0–17.0)
Immature Granulocytes: 0 %
Lymphocytes Relative: 22 %
Lymphs Abs: 1.8 10*3/uL (ref 0.7–4.0)
MCH: 27.1 pg (ref 26.0–34.0)
MCHC: 32.6 g/dL (ref 30.0–36.0)
MCV: 83.1 fL (ref 80.0–100.0)
Monocytes Absolute: 1.2 10*3/uL — ABNORMAL HIGH (ref 0.1–1.0)
Monocytes Relative: 14 %
Neutro Abs: 4.9 10*3/uL (ref 1.7–7.7)
Neutrophils Relative %: 59 %
Platelets: 289 10*3/uL (ref 150–400)
RBC: 4.84 MIL/uL (ref 4.22–5.81)
RDW: 18.3 % — ABNORMAL HIGH (ref 11.5–15.5)
WBC: 8.3 10*3/uL (ref 4.0–10.5)
nRBC: 0 % (ref 0.0–0.2)

## 2021-03-25 LAB — URINALYSIS, COMPLETE (UACMP) WITH MICROSCOPIC
Bacteria, UA: NONE SEEN
Bilirubin Urine: NEGATIVE
Glucose, UA: NEGATIVE mg/dL
Hgb urine dipstick: NEGATIVE
Ketones, ur: NEGATIVE mg/dL
Leukocytes,Ua: NEGATIVE
Nitrite: NEGATIVE
Protein, ur: NEGATIVE mg/dL
Specific Gravity, Urine: 1.004 — ABNORMAL LOW (ref 1.005–1.030)
pH: 7 (ref 5.0–8.0)

## 2021-03-25 LAB — COMPREHENSIVE METABOLIC PANEL
ALT: 11 U/L (ref 0–44)
AST: 12 U/L — ABNORMAL LOW (ref 15–41)
Albumin: 4 g/dL (ref 3.5–5.0)
Alkaline Phosphatase: 56 U/L (ref 38–126)
Anion gap: 11 (ref 5–15)
BUN: 19 mg/dL (ref 8–23)
CO2: 21 mmol/L — ABNORMAL LOW (ref 22–32)
Calcium: 9 mg/dL (ref 8.9–10.3)
Chloride: 107 mmol/L (ref 98–111)
Creatinine, Ser: 1.04 mg/dL (ref 0.61–1.24)
GFR, Estimated: >60 mL/min (ref >60)
Glucose, Bld: 120 mg/dL — ABNORMAL HIGH (ref 70–99)
Potassium: 3.5 mmol/L (ref 3.5–5.1)
Sodium: 139 mmol/L (ref 135–145)
Total Bilirubin: 0.7 mg/dL (ref 0.3–1.2)
Total Protein: 7.1 g/dL (ref 6.5–8.1)

## 2021-03-25 LAB — LIPASE, BLOOD: Lipase: 38 U/L (ref 11–51)

## 2021-03-25 MED ORDER — LIDOCAINE VISCOUS HCL 2 % MT SOLN
15.0000 mL | Freq: Once | OROMUCOSAL | Status: AC
  Administered 2021-03-25: 15 mL via ORAL
  Filled 2021-03-25: qty 15

## 2021-03-25 MED ORDER — ALUM & MAG HYDROXIDE-SIMETH 200-200-20 MG/5ML PO SUSP
30.0000 mL | Freq: Once | ORAL | Status: AC
  Administered 2021-03-25: 30 mL via ORAL
  Filled 2021-03-25: qty 30

## 2021-03-25 NOTE — ED Provider Notes (Signed)
[facility] Emergency Department Provider Note    ____________________________________________   I have reviewed the triage vital signs and the nursing notes.   HISTORY  Chief Complaint Abdominal Pain (Llq pain )   History limited by: Not Limited   HPI Miguel [name] is a 60 y.o. male who presents to the emergency department today because of concern for abdominal pain. The patient states that it is located in the suprapubic region. It started roughly 2 hours ago. It has been constant. It was severe but has slightly improved since then. He denies any associated nausea or vomiting. States that his bowels have been more formed than normal recently. Denies any fevers.    Records reviewed. Per medical record review patient has a history of frequent ER visits primarily for alcohol related issues.   Past Medical History:  Diagnosis Date  . Alcohol abuse   . Alcohol abuse   . Anxiety   . Asthma   . Family history of breast cancer   . GERD (gastroesophageal reflux disease)   . Gout   . Hx of colonic polyps   . Hypertension   . Kidney stone   . 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 OCD (obsessive compulsive disorder)   . Renal colic     Patient Active Problem List   Diagnosis Date Noted  . Hypotension 11/28/2020  . Genetic testing 07/15/2020  . 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 Right ankle pain 06/26/2020  . Family history of breast cancer   . Hx of colonic polyps   . Hospital discharge follow-up 06/05/2020  . Kidney function test abnormal 06/05/2020  . Abdominal pain 04/12/2020  . Hypertensive urgency 04/12/2020  . Alcohol withdrawal ([place]) 03/04/2020  . Right-sided chest wall pain 02/28/2020  . Alcohol abuse with alcohol-induced mood disorder ([place]) 12/02/2019  . Moderate episode of recurrent major depressive disorder ([place])   . Health care maintenance 10/26/2019  . Right knee pain 10/26/2019  . Generalized  anxiety disorder 10/14/2019  . MDD (major depressive disorder), recurrent severe, without psychosis ([place]) 07/27/2019  . Social anxiety disorder 07/27/2019  . Left-sided chest wall pain 06/14/2019  . Alcoholic cirrhosis of liver without ascites ([place]) 06/14/2019  . Alcohol abuse 06/14/2019  . AKI (acute kidney injury) ([place]) 05/27/2019  . Alcohol withdrawal syndrome without complication ([place]) 16/08/9603  . Alcoholic intoxication without complication ([place])   . Sepsis ([place]) 03/08/2019  . Breast lump or mass 10/04/2018  . Sepsis secondary to UTI ([place]) 09/14/2018  . Asthma 07/07/2018  . GERD (gastroesophageal reflux disease) 07/07/2018  . OCD (obsessive compulsive disorder) 07/07/2018  . Self-inflicted laceration of left wrist ([place]) 03/10/2018  . Leg hematoma 12/25/2017  . Suicide and self-inflicted injury by cutting and piercing instrument ([place]) 03/10/2017  . Severe recurrent major 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 ([place]) 03/09/2017  . Substance induced mood disorder ([place]) 08/15/2016  . Involuntary commitment 08/15/2016  . Alcohol use disorder, severe, dependence ([place]) 02/05/2016  . Hypertension 12/05/2015  . Tachycardia 12/05/2015  . Gout 11/13/2015  . Chronic back pain 05/02/2015    Past Surgical History:  Procedure Laterality Date  . CHOLECYSTECTOMY  2012  . COLONOSCOPY    . COLONOSCOPY WITH PROPOFOL N/A 05/28/2020   Procedure: COLONOSCOPY WITH PROPOFOL;  Surgeon: [name], MD;  Location: [facility] ENDOSCOPY;  Service: Gastroenterology;  Laterality: N/A;  . EXTRACORPOREAL SHOCK WAVE LITHOTRIPSY Left 01/12/2019   Procedure: EXTRACORPOREAL SHOCK WAVE LITHOTRIPSY (ESWL);  Surgeon:  Billey Co, MD;  Location: [facility] ORS;  Service: Urology;  Laterality: Left;  . UPPER GI ENDOSCOPY      Prior to Admission medications   Medication Sig Start Date End Date Taking? Authorizing Provider  ADVAIR DISKUS 100-50 MCG/ACT AEPB INHALE 1 PUFF 2 TIMES A DAY. RINSE MOUTH AND SPIT AFTER EACH USE.  06/12/20 06/19/21  Tawni Millers, MD  allopurinol (ZYLOPRIM) 300 MG tablet TAKE ONE TABLET BY MOUTH EVERY DAY 09/18/20 04/06/21  Iloabachie, Chioma E, NP  ascorbic acid (VITAMIN C) 500 MG tablet Take by mouth. 08/01/19   [provider]  folic acid (FOLVITE) 1 MG tablet TAKE ONE TABLET BY MOUTH EVERY DAY 03/21/21 03/21/22  Iloabachie, Chioma E, NP  lisinopril (ZESTRIL) 40 MG tablet TAKE ONE TABLET BY MOUTH EVERY DAY 09/18/20 09/18/21  Iloabachie, Chioma E, NP  loratadine (CLARITIN) 10 MG tablet Take 10 mg by mouth daily.    [provider]  metoprolol tartrate (LOPRESSOR) 25 MG tablet Take 1 tablet (25 mg total) by mouth daily. 03/19/21 04/20/21  Iloabachie, Chioma E, NP  mirtazapine (REMERON) 15 MG tablet Take 1 tablet (15 mg total) by mouth at bedtime. 03/19/21 04/20/21  Iloabachie, Chioma E, NP  pantoprazole (PROTONIX) 40 MG tablet TAKE 1 TABLET (40 MG TOTAL) BY MOUTH DAILY. 10/15/20 10/15/21  Iloabachie, Chioma E, NP  thiamine (VITAMIN B-1) 100 MG tablet TAKE ONE TABLET BY MOUTH EVERY DAY 10/22/20 10/22/21  Iloabachie, Chioma E, NP  VENTOLIN HFA 108 (90 Base) MCG/ACT inhaler INHALE 2 PUFFS EVERY 4 HOURS AS NEEDED 10/22/20 10/22/21  Tawni Millers, MD    Allergies Percocet [oxycodone-acetaminophen]  Family History  Problem Relation Age of Onset  . Alcohol abuse Father   . Breast cancer Mother 43  . Anxiety disorder Brother   . Other Maternal Grandmother        unknown medical history  . Other Maternal Grandfather        unknow medical history  . Other Paternal Grandmother        unknown medical history  . Other Paternal Grandfather        unknown medical history  . Healthy Brother     Social History Social History   Tobacco Use  . Smoking status: Former Smoker    Types: Cigarettes    Quit date: 03/19/1981    Years since quitting: 40.0  . Smokeless tobacco: Never Used  Vaping Use  . Vaping Use: Never used  Substance Use Topics  . Alcohol use: Yes    Alcohol/week: 12.0 standard  drinks    Types: 12 Shots of liquor per week    Comment: last use 03/11/21 (since last hospitilization)  . Drug use: No    Review of Systems Constitutional: No fever/chills Eyes: No visual changes. ENT: No sore throat. Cardiovascular: Denies chest pain. Respiratory: Denies shortness of breath. Gastrointestinal: Positive for suprapubic abdominal pain.  No nausea, no vomiting.  No diarrhea.   Genitourinary: Negative for dysuria. Musculoskeletal: Negative for back pain. Skin: Negative for rash. Neurological: Negative for headaches, focal weakness or numbness.  ____________________________________________   PHYSICAL EXAM:  VITAL SIGNS: ED Triage Vitals [03/25/21 1512]  Enc Vitals Group     BP      Pulse Rate 81     Resp 17     Temp 98.5 F (36.9 C)     Temp Source Oral     SpO2 95 %     Weight      Height  Head Circumference      Peak Flow      Pain Score 5   Constitutional: Alert and oriented.  Eyes: Conjunctivae are normal.  ENT      Head: Normocephalic and atraumatic.      Nose: No congestion/rhinnorhea.      Mouth/Throat: Mucous membranes are moist.      Neck: No stridor. Hematological/Lymphatic/Immunilogical: No cervical lymphadenopathy. Cardiovascular: Normal rate, regular rhythm.  No murmurs, rubs, or gallops.  Respiratory: Normal respiratory effort without tachypnea nor retractions. Breath sounds are clear and equal bilaterally. No wheezes/rales/rhonchi. Gastrointestinal: Soft and minimally tender in the suprapubic region.  Genitourinary: Deferred Musculoskeletal: Normal range of motion in all extremities. No lower extremity edema. Neurologic:  Normal speech and language. No gross focal neurologic deficits are appreciated.  Skin:  Skin is warm, dry and intact. No rash noted. Psychiatric: Mood and affect are normal. Speech and behavior are normal. Patient exhibits appropriate insight and judgment.  ____________________________________________    LABS  (pertinent positives/negatives)  Lipase 38 CBC wbc 8.3, hgb 13.1, plt 289 CMP wnl except co2 21, glu 120, ast 12 UA clear, 0-5 rbc and wbc  ____________________________________________   EKG  None  ____________________________________________    RADIOLOGY  None  ____________________________________________   PROCEDURES  Procedures  ____________________________________________   INITIAL IMPRESSION / ASSESSMENT AND PLAN / ED COURSE  Pertinent labs & imaging results that were available during my care of the patient were reviewed by me and considered in my medical decision making (see chart for details).   Patient presented to the emergency department today because of concern for abdominal pain. On exam patient with very minimal tenderness to suprapubic region. Blood work and urine without concerning findings. Patient did state he felt better after GI cocktail. Requested discharge home so he could help take care of his wife. At this time I think that is reasonable. Do not feel any emergent imaging is necessary.   ___________________________________________   FINAL CLINICAL IMPRESSION(S) / ED DIAGNOSES  Final diagnoses:  Abdominal pain, unspecified abdominal location     Note: This dictation was prepared with Dragon dictation. Any transcriptional errors that result from this process are unintentional     Nance Pear, MD 03/25/21 [phone]

## 2021-03-25 NOTE — ED Triage Notes (Signed)
Pain x 2 hours. Etoh, p

## 2021-03-25 NOTE — Discharge Instructions (Addendum)
Please seek medical attention for any high fevers, chest pain, shortness of breath, change in behavior, persistent vomiting, bloody stool or any other new or concerning symptoms.  

## 2021-04-04 ENCOUNTER — Other Ambulatory Visit: Payer: Self-pay

## 2021-04-08 ENCOUNTER — Other Ambulatory Visit: Payer: Self-pay

## 2021-04-08 MED FILL — Lisinopril Tab 40 MG: ORAL | Qty: 90 | Fill #0 | Status: CN

## 2021-04-10 ENCOUNTER — Other Ambulatory Visit: Payer: Self-pay

## 2021-04-10 MED FILL — Pantoprazole Sodium EC Tab 40 MG (Base Equiv): ORAL | 30 days supply | Qty: 30 | Fill #0 | Status: AC

## 2021-04-11 ENCOUNTER — Other Ambulatory Visit: Payer: Self-pay

## 2021-04-11 MED FILL — Lisinopril Tab 40 MG: ORAL | 30 days supply | Qty: 30 | Fill #0 | Status: AC

## 2021-04-13 ENCOUNTER — Encounter: Payer: Self-pay | Admitting: Emergency Medicine

## 2021-04-13 ENCOUNTER — Emergency Department
Admission: EM | Admit: 2021-04-13 | Discharge: 2021-04-13 | Disposition: A | Discharge status: Home / Self Care | Payer: Medicaid Other | Attending: Emergency Medicine | Admitting: Emergency Medicine

## 2021-04-13 ENCOUNTER — Other Ambulatory Visit: Payer: Self-pay

## 2021-04-13 DIAGNOSIS — R0981 Nasal congestion: Secondary | ICD-10-CM

## 2021-04-13 DIAGNOSIS — Z87891 Personal history of nicotine dependence: Secondary | ICD-10-CM | POA: Insufficient documentation

## 2021-04-13 DIAGNOSIS — Z20822 Contact with and (suspected) exposure to covid-19: Secondary | ICD-10-CM | POA: Insufficient documentation

## 2021-04-13 DIAGNOSIS — I10 Essential (primary) hypertension: Secondary | ICD-10-CM | POA: Insufficient documentation

## 2021-04-13 DIAGNOSIS — J45909 Unspecified asthma, uncomplicated: Secondary | ICD-10-CM | POA: Insufficient documentation

## 2021-04-13 DIAGNOSIS — Z79899 Other long term (current) drug therapy: Secondary | ICD-10-CM | POA: Insufficient documentation

## 2021-04-13 LAB — RESP PANEL BY RT-PCR (FLU A&B, COVID) ARPGX2
Influenza A by PCR: NEGATIVE
Influenza B by PCR: NEGATIVE
SARS Coronavirus 2 by RT PCR: NEGATIVE

## 2021-04-13 MED ORDER — PREDNISONE 10 MG PO TABS: 30.0000 mg | ORAL_TABLET | Freq: Every day | ORAL | 0 refills | Status: DC

## 2021-04-13 MED ORDER — MOMETASONE FUROATE 50 MCG/ACT NA SUSP: 2.0000 | Freq: Every day | NASAL | 2 refills | Status: DC

## 2021-04-13 NOTE — ED Provider Notes (Signed)
[facility] Emergency Department Provider Note  ____________________________________________   Event Date/Time   First MD Initiated Contact with Patient 04/13/21 [phone]     (approximate)  I have reviewed the triage vital signs and the nursing notes.   HISTORY  Chief Complaint Nasal Congestion    HPI Miguel [name] is a 60 y.o. male presents emergency department with nasal congestion.  No fever or chills.  Patient states he has used several over-the-counter products without any relief.  Symptoms started yesterday.  States he got no sleep due to the congestion.  He denies cough.  He is concerned about COVID as his wife is sick and he takes care of her.    Past Medical History:  Diagnosis Date  . Alcohol abuse   . Alcohol abuse   . Anxiety   . Asthma   . Family history of breast cancer   . GERD (gastroesophageal reflux disease)   . Gout   . Hx of colonic polyps   . Hypertension   . Kidney stone   . 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 OCD (obsessive compulsive disorder)   . Renal colic     Patient Active Problem List   Diagnosis Date Noted  . Hypotension 11/28/2020  . Genetic testing 07/15/2020  . 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 Right ankle pain 06/26/2020  . Family history of breast cancer   . Hx of colonic polyps   . Hospital discharge follow-up 06/05/2020  . Kidney function test abnormal 06/05/2020  . Abdominal pain 04/12/2020  . Hypertensive urgency 04/12/2020  . Alcohol withdrawal ([place]) 03/04/2020  . Right-sided chest wall pain 02/28/2020  . Alcohol abuse with alcohol-induced mood disorder ([place]) 12/02/2019  . Moderate episode of recurrent major depressive disorder ([place])   . Health care maintenance 10/26/2019  . Right knee pain 10/26/2019  . Generalized anxiety disorder 10/14/2019  . MDD (major depressive disorder), recurrent severe, without psychosis ([place]) 07/27/2019  . Social anxiety disorder  07/27/2019  . Left-sided chest wall pain 06/14/2019  . Alcoholic cirrhosis of liver without ascites ([place]) 06/14/2019  . Alcohol abuse 06/14/2019  . AKI (acute kidney injury) ([place]) 05/27/2019  . Alcohol withdrawal syndrome without complication ([place]) 99/35/7017  . Alcoholic intoxication without complication ([place])   . Sepsis ([place]) 03/08/2019  . Breast lump or mass 10/04/2018  . Sepsis secondary to UTI ([place]) 09/14/2018  . Asthma 07/07/2018  . GERD (gastroesophageal reflux disease) 07/07/2018  . OCD (obsessive compulsive disorder) 07/07/2018  . Self-inflicted laceration of left wrist ([place]) 03/10/2018  . Leg hematoma 12/25/2017  . Suicide and self-inflicted injury by cutting and piercing instrument ([place]) 03/10/2017  . Severe recurrent major 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 ([place]) 03/09/2017  . Substance induced mood disorder ([place]) 08/15/2016  . Involuntary commitment 08/15/2016  . Alcohol use disorder, severe, dependence ([place]) 02/05/2016  . Hypertension 12/05/2015  . Tachycardia 12/05/2015  . Gout 11/13/2015  . Chronic back pain 05/02/2015    Past Surgical History:  Procedure Laterality Date  . CHOLECYSTECTOMY  2012  . COLONOSCOPY    . COLONOSCOPY WITH PROPOFOL N/A 05/28/2020   Procedure: COLONOSCOPY WITH PROPOFOL;  Surgeon: [name], MD;  Location: [facility] ENDOSCOPY;  Service: Gastroenterology;  Laterality: N/A;  . EXTRACORPOREAL SHOCK WAVE LITHOTRIPSY Left 01/12/2019   Procedure: EXTRACORPOREAL SHOCK WAVE LITHOTRIPSY (ESWL);  Surgeon: Billey Co, MD;  Location: [facility] ORS;  Service: Urology;  Laterality: Left;  . UPPER GI ENDOSCOPY  Prior to Admission medications   Medication Sig Start Date End Date Taking? Authorizing Provider  mometasone (NASONEX) 50 MCG/ACT nasal spray Place 2 sprays into the nose daily. 04/13/21 04/13/22 Yes Jax Abdelrahman, Linden Dolin, PA-C  predniSONE (DELTASONE) 10 MG tablet Take 3 tablets (30 mg total) by mouth daily with breakfast. 04/13/21  Yes Donis Pinder, Linden Dolin, PA-C  ADVAIR DISKUS 100-50 MCG/ACT AEPB INHALE 1 PUFF 2 TIMES A DAY. RINSE MOUTH AND SPIT AFTER EACH USE. 06/12/20 06/19/21  Tawni Millers, MD  allopurinol (ZYLOPRIM) 300 MG tablet TAKE ONE TABLET BY MOUTH EVERY DAY 09/18/20 04/06/21  Iloabachie, Chioma E, NP  ascorbic acid (VITAMIN C) 500 MG tablet Take by mouth. 08/01/19   [provider]  folic acid (FOLVITE) 1 MG tablet TAKE ONE TABLET BY MOUTH EVERY DAY 03/21/21 03/21/22  Iloabachie, Chioma E, NP  lisinopril (ZESTRIL) 40 MG tablet TAKE ONE TABLET BY MOUTH EVERY DAY 09/18/20 09/18/21  Iloabachie, Chioma E, NP  loratadine (CLARITIN) 10 MG tablet Take 10 mg by mouth daily.    [provider]  metoprolol tartrate (LOPRESSOR) 25 MG tablet Take 1 tablet (25 mg total) by mouth daily. 03/19/21 04/20/21  Iloabachie, Chioma E, NP  mirtazapine (REMERON) 15 MG tablet Take 1 tablet (15 mg total) by mouth at bedtime. 03/19/21 04/20/21  Iloabachie, Chioma E, NP  pantoprazole (PROTONIX) 40 MG tablet TAKE 1 TABLET (40 MG TOTAL) BY MOUTH DAILY. 10/15/20 10/15/21  Iloabachie, Chioma E, NP  thiamine (VITAMIN B-1) 100 MG tablet TAKE ONE TABLET BY MOUTH EVERY DAY 10/22/20 10/22/21  Iloabachie, Chioma E, NP  VENTOLIN HFA 108 (90 Base) MCG/ACT inhaler INHALE 2 PUFFS EVERY 4 HOURS AS NEEDED 10/22/20 10/22/21  Tawni Millers, MD    Allergies Percocet [oxycodone-acetaminophen]  Family History  Problem Relation Age of Onset  . Alcohol abuse Father   . Breast cancer Mother 51  . Anxiety disorder Brother   . Other Maternal Grandmother        unknown medical history  . Other Maternal Grandfather        unknow medical history  . Other Paternal Grandmother        unknown medical history  . Other Paternal Grandfather        unknown medical history  . Healthy Brother     Social History Social History   Tobacco Use  . Smoking status: Former Smoker    Types: Cigarettes    Quit date: 03/19/1981    Years since quitting: 40.0  . Smokeless tobacco: Never Used   Vaping Use  . Vaping Use: Never used  Substance Use Topics  . Alcohol use: Yes    Alcohol/week: 12.0 standard drinks    Types: 12 Shots of liquor per week    Comment: last use 03/11/21 (since last hospitilization)  . Drug use: No    Review of Systems  Constitutional: No fever/chills Eyes: No visual changes. ENT: No sore throat.  Positive nasal congestion Respiratory: Denies cough Cardiovascular: Denies chest pain Gastrointestinal: Denies abdominal pain Genitourinary: Negative for dysuria. Musculoskeletal: Negative for back pain. Skin: Negative for rash. Psychiatric: no mood changes,     ____________________________________________   PHYSICAL EXAM:  VITAL SIGNS: ED Triage Vitals  Enc Vitals Group     BP 04/13/21 0859 (!) 164/99     Pulse Rate 04/13/21 0859 83     Resp 04/13/21 0859 20     Temp 04/13/21 0901 97.9 F (36.6 C)     Temp Source 04/13/21 0901  Oral     SpO2 04/13/21 0859 99 %     Weight 04/13/21 0859 185 lb (83.9 kg)     Height 04/13/21 0859 6' (1.829 m)     Head Circumference --      Peak Flow --      Pain Score 04/13/21 0859 0     Pain Loc --      Pain Edu? --      Excl. in [place]? --     Constitutional: Alert and oriented. Well appearing and in no acute distress. Eyes: Conjunctivae are normal.  Head: Atraumatic. Nose: Active congestion/rhinnorhea.  Nasal mucosa swollen Mouth/Throat: Mucous membranes are moist.   Neck:  supple no lymphadenopathy noted Cardiovascular: Normal rate, regular rhythm. Heart sounds are normal Respiratory: Normal respiratory effort.  No retractions, lungs c t a  GU: deferred Musculoskeletal: FROM all extremities, warm and well perfused Neurologic:  Normal speech and language.  Skin:  Skin is warm, dry and intact. No rash noted. Psychiatric: Mood and affect are normal. Speech and behavior are normal.  ____________________________________________   LABS (all labs ordered are listed, but only abnormal results are  displayed)  Labs Reviewed  RESP PANEL BY RT-PCR (FLU A&B, COVID) [id number]   ____________________________________________   ____________________________________________  RADIOLOGY    ____________________________________________   PROCEDURES  Procedure(s) performed: No  Procedures    ____________________________________________   INITIAL IMPRESSION / ASSESSMENT AND PLAN / ED COURSE  Pertinent labs & imaging results that were available during my care of the patient were reviewed by me and considered in my medical decision making (see chart for details).   Patient 60 year old male presents for nasal congestion.  See HPI.  Physical exam shows patient appears stable  I explained all the findings to the patient.  We will try a nasal spray along with 3-day burst of prednisone.  COVID swab obtained.  He is to look at the results on [place].  Quarantine if positive.  He was discharged in stable condition.     Miguel [name] was evaluated in Emergency Department on 04/13/2021 for the symptoms described in the history of present illness. He was evaluated in the context of the global COVID-19 pandemic, which necessitated consideration that the patient might be at risk for infection with the SARS-CoV-2 virus that causes COVID-19. Institutional protocols and algorithms that pertain to the evaluation of patients at risk for COVID-19 are in a state of rapid change based on information released by regulatory bodies including the CDC and federal and state organizations. These policies and algorithms were followed during the patient's care in the ED.    As part of my medical decision making, I reviewed the following data within the [place] notes reviewed and incorporated, Old chart reviewed, Notes from prior ED visits and [place] Controlled Substance Database  ____________________________________________   FINAL CLINICAL IMPRESSION(S) / ED DIAGNOSES  Final  diagnoses:  Nasal congestion  Suspected COVID-19 virus infection      NEW MEDICATIONS STARTED DURING THIS VISIT:  New Prescriptions   MOMETASONE (NASONEX) 50 MCG/ACT NASAL SPRAY    Place 2 sprays into the nose daily.   PREDNISONE (DELTASONE) 10 MG TABLET    Take 3 tablets (30 mg total) by mouth daily with breakfast.     Note:  This document was prepared using Dragon voice recognition software and may include unintentional dictation errors.    Versie Starks, PA-C 04/13/21 [id number]    Blake Divine, MD 04/14/21  [place]

## 2021-04-13 NOTE — ED Triage Notes (Signed)
Pt reports yesterday started with some nasal congestion. Pt reports uses nasal spray and has for awhile but thinks it is no longer working. Pt denies fevers, cough or other sx's.

## 2021-04-13 NOTE — Discharge Instructions (Addendum)
Take over-the-counter Mucinex.  This should help dry up the mucus.  Use the Nasonex as prescribed.  Prednisone 30 mg for 3 days.  Return if worsening.  Your COVID test should result in 2 to 3 hours.  You can see your result on [facility] MyChart

## 2021-04-13 NOTE — ED Notes (Signed)
See triage note  Presents with nasal congestion  No fever  Provider in room on arrival to treatment area

## 2021-04-15 ENCOUNTER — Other Ambulatory Visit: Payer: Self-pay

## 2021-04-15 ENCOUNTER — Encounter: Payer: Self-pay | Admitting: Gerontology

## 2021-04-15 ENCOUNTER — Ambulatory Visit: Payer: Medicaid Other | Admitting: Gerontology

## 2021-04-15 VITALS — BP 151/89 | HR 71 | Temp 97.2°F | Resp 16 | Ht 72.0 in | Wt 192.0 lb

## 2021-04-15 DIAGNOSIS — F411 Generalized anxiety disorder: Secondary | ICD-10-CM

## 2021-04-15 DIAGNOSIS — I158 Other secondary hypertension: Secondary | ICD-10-CM

## 2021-04-15 DIAGNOSIS — I10 Essential (primary) hypertension: Secondary | ICD-10-CM

## 2021-04-15 DIAGNOSIS — M109 Gout, unspecified: Secondary | ICD-10-CM

## 2021-04-15 MED ORDER — LISINOPRIL 40 MG PO TABS
ORAL_TABLET | Freq: Every day | ORAL | 1 refills | Status: DC
  Filled 2021-04-15: qty 90, fill #0

## 2021-04-15 MED ORDER — ALLOPURINOL 300 MG PO TABS
ORAL_TABLET | Freq: Every day | ORAL | 1 refills | Status: DC
  Filled 2021-04-15: qty 30, 30d supply, fill #0
  Filled 2021-05-21: qty 90, 90d supply, fill #0

## 2021-04-15 MED ORDER — MIRTAZAPINE 15 MG PO TABS
15.0000 mg | ORAL_TABLET | Freq: Every day | ORAL | 0 refills | Status: DC
Start: 2021-04-15 — End: 2021-05-21
  Filled 2021-04-15: qty 30, 30d supply, fill #0

## 2021-04-15 MED ORDER — METOPROLOL TARTRATE 25 MG PO TABS
25.0000 mg | ORAL_TABLET | Freq: Every day | ORAL | 0 refills | Status: DC
  Filled 2021-04-15: qty 30, 30d supply, fill #0

## 2021-04-15 NOTE — Patient Instructions (Signed)

## 2021-04-15 NOTE — Progress Notes (Signed)
Established Patient Office Visit  Subjective:  Patient ID: Miguel [name], male    DOB: [birth year]  Age: 60 y.o. MRN: [id number]  CC:  Chief Complaint  Patient presents with  . Follow-up  . Hypertension    Patient brought a BP log for review    HPI Miguel [name] is a 60 y/o male with PMH of Alcohol abuse, Anxiety, Asthma, [name], presents for follow up of hypertension and medication refill. He was treated at the ED with Prednisone and Mometasone on 04/13/21 for Nasal congestion. Currently, he states that his symptoms has resolved. His blood pressure was elevated during visit, and brought his blood pressure log. His SBP ranges between 117-149 and DBP ranges between 65-92. He denies chest pain, headache, light headedness and vision changes. He states that he is compliant with his medications and continues to make healthy lifestyle changes. He also states that he has not had any alcoholic drink for more than one month. Overall, he states that he's doing well and offers no further complaint.  Past Medical History:  Diagnosis Date  . Alcohol abuse   . Alcohol abuse   . Anxiety   . Asthma   . Family history of breast cancer   . GERD (gastroesophageal reflux disease)   . Gout   . Hx of colonic polyps   . Hypertension   . Kidney stone   . 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 OCD (obsessive compulsive disorder)   . Renal colic     Past Surgical History:  Procedure Laterality Date  . CHOLECYSTECTOMY  2012  . COLONOSCOPY    . COLONOSCOPY WITH PROPOFOL N/A 05/28/2020   Procedure: COLONOSCOPY WITH PROPOFOL;  Surgeon: [name], MD;  Location: [facility] ENDOSCOPY;  Service: Gastroenterology;  Laterality: N/A;  . EXTRACORPOREAL SHOCK WAVE LITHOTRIPSY Left 01/12/2019   Procedure: EXTRACORPOREAL SHOCK WAVE LITHOTRIPSY (ESWL);  Surgeon: Billey Co, MD;  Location: [facility] ORS;  Service: Urology;  Laterality: Left;  . UPPER GI ENDOSCOPY      Family History  Problem Relation Age of  Onset  . Alcohol abuse Father   . Breast cancer Mother 63  . Anxiety disorder Brother   . Other Maternal Grandmother        unknown medical history  . Other Maternal Grandfather        unknow medical history  . Other Paternal Grandmother        unknown medical history  . Other Paternal Grandfather        unknown medical history  . Healthy Brother     Social History   Socioeconomic History  . Marital status: Married    Spouse name: Not on file  . Number of children: Not on file  . Years of education: Not on file  . Highest education level: Not on file  Occupational History  . Not on file  Tobacco Use  . Smoking status: Former Smoker    Types: Cigarettes    Quit date: 03/19/1981    Years since quitting: 40.1  . Smokeless tobacco: Never Used  Vaping Use  . Vaping Use: Never used  Substance and Sexual Activity  . Alcohol use: Yes    Alcohol/week: 12.0 standard drinks    Types: 12 Shots of liquor per week    Comment: last use 03/11/21 (since last hospitilization)  . Drug use: No  . Sexual activity: Not Currently  Other Topics Concern  . Not on file  Social History  Narrative   Lives at home with his wife and takes care of his wife. Independent at baseline      - Biggest strain is financial but doesn't think they could got get more help.      Patient expressed interest in possible financial assistance. Informed written consent obtained   Social Determinants of Health   Financial Resource Strain: High Risk  . Difficulty of Paying Living Expenses: Very hard  Food Insecurity: No Food Insecurity  . Worried About Charity fundraiser in the Last Year: Never true  . Ran Out of Food in the Last Year: Never true  Transportation Needs: No Transportation Needs  . Lack of Transportation (Medical): No  . Lack of Transportation (Non-Medical): No  Physical Activity: Sufficiently Active  . Days of Exercise per Week: 7 days  . Minutes of Exercise per Session: 60 min  Stress: Stress  Concern Present  . Feeling of Stress : Very much  Social Connections: Socially Isolated  . Frequency of Communication with Friends and Family: Once a week  . Frequency of Social Gatherings with Friends and Family: Once a week  . Attends Religious Services: Never  . Active Member of Clubs or Organizations: No  . Attends Archivist Meetings: Never  . Marital Status: Married  Human resources officer Violence: Not At Risk  . Fear of Current or Ex-Partner: No  . Emotionally Abused: No  . Physically Abused: No  . Sexually Abused: No    Outpatient Medications Prior to Visit  Medication Sig Dispense Refill  . ADVAIR DISKUS 100-50 MCG/ACT AEPB INHALE 1 PUFF 2 TIMES A DAY. RINSE MOUTH AND SPIT AFTER EACH USE. 180 each 3  . ascorbic acid (VITAMIN C) 500 MG tablet Take by mouth.    . folic acid (FOLVITE) 1 MG tablet TAKE ONE TABLET BY MOUTH EVERY DAY 30 tablet 1  . loratadine (CLARITIN) 10 MG tablet Take 10 mg by mouth daily.    . mometasone (NASONEX) 50 MCG/ACT nasal spray Place 2 sprays into the nose daily. 1 each 2  . pantoprazole (PROTONIX) 40 MG tablet TAKE 1 TABLET (40 MG TOTAL) BY MOUTH DAILY. 90 tablet 1  . predniSONE (DELTASONE) 10 MG tablet Take 3 tablets (30 mg total) by mouth daily with breakfast. 9 tablet 0  . thiamine (VITAMIN B-1) 100 MG tablet TAKE ONE TABLET BY MOUTH EVERY DAY 90 tablet 0  . VENTOLIN HFA 108 (90 Base) MCG/ACT inhaler INHALE 2 PUFFS EVERY 4 HOURS AS NEEDED 72 g 1  . allopurinol (ZYLOPRIM) 300 MG tablet TAKE ONE TABLET BY MOUTH EVERY DAY 90 tablet 1  . lisinopril (ZESTRIL) 40 MG tablet TAKE ONE TABLET BY MOUTH EVERY DAY 90 tablet 1  . metoprolol tartrate (LOPRESSOR) 25 MG tablet Take 1 tablet (25 mg total) by mouth daily. 30 tablet 0  . mirtazapine (REMERON) 15 MG tablet Take 1 tablet (15 mg total) by mouth at bedtime. 30 tablet 0   No facility-administered medications prior to visit.    No Known Allergies  ROS Review of Systems  Constitutional: Negative.    Respiratory: Negative.   Cardiovascular: Negative.   Skin: Negative.   Neurological: Negative.   Psychiatric/Behavioral: Negative.       Objective:    Physical Exam HENT:     Head: Normocephalic and atraumatic.  Eyes:     Extraocular Movements: Extraocular movements intact.     Conjunctiva/sclera: Conjunctivae normal.     Pupils: Pupils are equal, round, and reactive to  light.  Cardiovascular:     Rate and Rhythm: Normal rate and regular rhythm.     Pulses: Normal pulses.     Heart sounds: Normal heart sounds.  Pulmonary:     Effort: Pulmonary effort is normal.     Breath sounds: Normal breath sounds.  Skin:    General: Skin is warm.  Neurological:     General: No focal deficit present.     Mental Status: He is alert and oriented to person, place, and time. Mental status is at baseline.  Psychiatric:        Mood and Affect: Mood normal.        Behavior: Behavior normal.        Thought Content: Thought content normal.        Judgment: Judgment normal.     BP (!) 151/89 (BP Location: Left Arm, Patient Position: Sitting, Cuff Size: Large)   Pulse 71   Temp (!) 97.2 F (36.2 C)   Resp 16   Ht 6' (1.829 m)   Wt 192 lb (87.1 kg)   SpO2 96%   BMI 26.04 kg/m  Wt Readings from Last 3 Encounters:  04/15/21 192 lb (87.1 kg)  04/13/21 185 lb (83.9 kg)  03/19/21 191 lb 12.8 oz (87 kg)     Health Maintenance Due  Topic Date Due  . COVID-19 Vaccine (1) Never done  . Hepatitis C Screening  Never done  . Zoster Vaccines- Shingrix (1 of 2) Never done    There are no preventive care reminders to display for this patient.  Lab Results  Component Value Date   TSH 0.052 (L) 11/29/2020   Lab Results  Component Value Date   WBC 8.3 03/25/2021   HGB 13.1 03/25/2021   HCT 40.2 03/25/2021   MCV 83.1 03/25/2021   PLT 289 03/25/2021   Lab Results  Component Value Date   NA 139 03/25/2021   K 3.5 03/25/2021   CO2 21 (L) 03/25/2021   GLUCOSE 120 (H) 03/25/2021    BUN 19 03/25/2021   CREATININE 1.04 03/25/2021   BILITOT 0.7 03/25/2021   ALKPHOS 56 03/25/2021   AST 12 (L) 03/25/2021   ALT 11 03/25/2021   PROT 7.1 03/25/2021   ALBUMIN 4.0 03/25/2021   CALCIUM 9.0 03/25/2021   ANIONGAP 11 03/25/2021   Lab Results  Component Value Date   CHOL 155 05/22/2020   Lab Results  Component Value Date   HDL 65 05/22/2020   Lab Results  Component Value Date   LDLCALC 73 05/22/2020   Lab Results  Component Value Date   TRIG 92 05/22/2020   Lab Results  Component Value Date   CHOLHDL 2.4 05/22/2020   Lab Results  Component Value Date   HGBA1C 5.2 11/29/2020      Assessment & Plan:    1. Generalized anxiety disorder - His anxiety is under control, he will continue current medication and follow [facility] with [facility]al health team Ms. Pruitt Heather. He was advised to call the Crisis help line with worsening symptoms. - mirtazapine (REMERON) 15 MG tablet; Take 1 tablet (15 mg total) by mouth at bedtime.  Dispense: 30 tablet; Refill: 0  2. Gout, unspecified cause, unspecified chronicity, unspecified site - His gout is under control and he will continue current medication - allopurinol (ZYLOPRIM) 300 MG tablet; TAKE ONE TABLET BY MOUTH EVERY DAY  Dispense: 90 tablet; Refill: 1  3. Essential hypertension - His blood pressure is not controlled, his goal  should be less than 140/90. He didn't bring his medication to appointment, unable to determine if he was taking Metoprolol 25 mg, but he admits to taking Lisinopril 40 mg. He was encouraged to continue on DASH diet and exercise as tolerated and continue to abstain from alcohol consumption. - lisinopril (ZESTRIL) 40 MG tablet; TAKE ONE TABLET BY MOUTH EVERY DAY  Dispense: 90 tablet; Refill: 1 - metoprolol tartrate (LOPRESSOR) 25 MG tablet; Take 1 tablet (25 mg total) by mouth daily.  Dispense: 30 tablet; Refill: 0    Follow-up: Return in about 1 month (around 05/15/2021), or if symptoms worsen or  fail to improve.    Damontre Millea Jerold Coombe, NP

## 2021-04-16 ENCOUNTER — Telehealth: Payer: Self-pay | Admitting: Pharmacist

## 2021-04-16 ENCOUNTER — Ambulatory Visit: Payer: Self-pay | Admitting: Gerontology

## 2021-04-16 NOTE — Telephone Encounter (Signed)
Patient approved for medication assistance at [facility] until 01/14/22, as long as eligibility criteria continues to be met.   Vonda [facility] Administrative Assistant 

## 2021-04-18 ENCOUNTER — Telehealth: Payer: Self-pay | Admitting: Pharmacist

## 2021-04-18 NOTE — Telephone Encounter (Signed)
04/18/2021 4:17:20 PM - Dexilant forms to pat. & dr for renewal  -- Miguel [name] - Friday, April 18, 2021 4:16 PM --Now that patient has been Recertified --reprinted [employer] application for Dexilant--mailing patient his portion to sign & return, also send to [facility] for Dr. Mable Fill to sign & return.

## 2021-04-21 ENCOUNTER — Other Ambulatory Visit: Payer: Self-pay

## 2021-04-21 ENCOUNTER — Telehealth: Payer: Self-pay | Admitting: Pharmacist

## 2021-04-21 NOTE — Telephone Encounter (Signed)
04/21/2021 11:14:29 AM - [employer] for to pt-renewal & script to dr  -- Elmer Picker - Monday, April 21, 2021 11:13 AM --Time for patient to renew with [place] for Advair-Printed renewal app. Mailing patient his portion to sign & return, also sending script to dr to sign & return.

## 2021-05-02 ENCOUNTER — Telehealth: Payer: Self-pay | Admitting: Pharmacist

## 2021-05-02 NOTE — Telephone Encounter (Signed)
05/02/2021 9:23:09 AM - Advair pending  -- Elmer Picker - Friday, May 02, 2021 9:22 AM --I have received the signed script for Advair back from provider, --holding for patient to return his portion--mailed to patient 04/21/2021.   05/02/2021 9:22:14 AM - ProAir faxed to [facility] - Friday, May 02, 2021 9:21 AM --Virgel Gess Teva application for ProAir HFA Inhale 2 puffs every 4 hours as needed replaces Ventolin HFA.

## 2021-05-03 ENCOUNTER — Other Ambulatory Visit: Payer: Self-pay

## 2021-05-03 ENCOUNTER — Emergency Department
Admission: EM | Admit: 2021-05-03 | Discharge: 2021-05-03 | Disposition: A | Discharge status: Home / Self Care | Payer: Medicaid Other | Attending: Emergency Medicine | Admitting: Emergency Medicine

## 2021-05-03 ENCOUNTER — Encounter: Payer: Self-pay | Admitting: Emergency Medicine

## 2021-05-03 DIAGNOSIS — F10129 Alcohol abuse with intoxication, unspecified: Secondary | ICD-10-CM | POA: Insufficient documentation

## 2021-05-03 DIAGNOSIS — J45909 Unspecified asthma, uncomplicated: Secondary | ICD-10-CM | POA: Insufficient documentation

## 2021-05-03 DIAGNOSIS — I10 Essential (primary) hypertension: Secondary | ICD-10-CM | POA: Insufficient documentation

## 2021-05-03 DIAGNOSIS — F10920 Alcohol use, unspecified with intoxication, uncomplicated: Secondary | ICD-10-CM

## 2021-05-03 DIAGNOSIS — Z79899 Other long term (current) drug therapy: Secondary | ICD-10-CM | POA: Insufficient documentation

## 2021-05-03 DIAGNOSIS — R0981 Nasal congestion: Secondary | ICD-10-CM | POA: Insufficient documentation

## 2021-05-03 DIAGNOSIS — Z7951 Long term (current) use of inhaled steroids: Secondary | ICD-10-CM | POA: Insufficient documentation

## 2021-05-03 DIAGNOSIS — Z20822 Contact with and (suspected) exposure to covid-19: Secondary | ICD-10-CM | POA: Insufficient documentation

## 2021-05-03 DIAGNOSIS — Z87891 Personal history of nicotine dependence: Secondary | ICD-10-CM | POA: Insufficient documentation

## 2021-05-03 DIAGNOSIS — Y908 Blood alcohol level of 240 mg/100 ml or more: Secondary | ICD-10-CM | POA: Insufficient documentation

## 2021-05-03 DIAGNOSIS — R300 Dysuria: Secondary | ICD-10-CM | POA: Insufficient documentation

## 2021-05-03 LAB — COMPREHENSIVE METABOLIC PANEL
ALT: 15 U/L (ref 0–44)
AST: 11 U/L — ABNORMAL LOW (ref 15–41)
Albumin: 4.3 g/dL (ref 3.5–5.0)
Alkaline Phosphatase: 61 U/L (ref 38–126)
Anion gap: 7 (ref 5–15)
BUN: 17 mg/dL (ref 8–23)
CO2: 24 mmol/L (ref 22–32)
Calcium: 8.8 mg/dL — ABNORMAL LOW (ref 8.9–10.3)
Chloride: 112 mmol/L — ABNORMAL HIGH (ref 98–111)
Creatinine, Ser: 1 mg/dL (ref 0.61–1.24)
GFR, Estimated: >60 mL/min (ref >60)
Glucose, Bld: 112 mg/dL — ABNORMAL HIGH (ref 70–99)
Potassium: 3.8 mmol/L (ref 3.5–5.1)
Sodium: 143 mmol/L (ref 135–145)
Total Bilirubin: 0.4 mg/dL (ref 0.3–1.2)
Total Protein: 7.4 g/dL (ref 6.5–8.1)

## 2021-05-03 LAB — ETHANOL: Alcohol, Ethyl (B): 393 mg/dL (ref <10)

## 2021-05-03 LAB — CBC
HCT: 37.4 % — ABNORMAL LOW (ref 39.0–52.0)
Hemoglobin: 12.3 g/dL — ABNORMAL LOW (ref 13.0–17.0)
MCH: 26.9 pg (ref 26.0–34.0)
MCHC: 32.9 g/dL (ref 30.0–36.0)
MCV: 81.7 fL (ref 80.0–100.0)
Platelets: 179 10*3/uL (ref 150–400)
RBC: 4.58 MIL/uL (ref 4.22–5.81)
RDW: 18.6 % — ABNORMAL HIGH (ref 11.5–15.5)
WBC: 6.7 10*3/uL (ref 4.0–10.5)
nRBC: 0 % (ref 0.0–0.2)

## 2021-05-03 LAB — URINE DRUG SCREEN, QUALITATIVE (ARMC ONLY)
Amphetamines, Ur Screen: NOT DETECTED
Barbiturates, Ur Screen: NOT DETECTED
Benzodiazepine, Ur Scrn: POSITIVE — AB
Cannabinoid 50 Ng, Ur ~~LOC~~: POSITIVE — AB
Cocaine Metabolite,Ur ~~LOC~~: NOT DETECTED
MDMA (Ecstasy)Ur Screen: NOT DETECTED
Methadone Scn, Ur: NOT DETECTED
Opiate, Ur Screen: NOT DETECTED
Phencyclidine (PCP) Ur S: NOT DETECTED
Tricyclic, Ur Screen: NOT DETECTED

## 2021-05-03 LAB — URINALYSIS, COMPLETE (UACMP) WITH MICROSCOPIC
Bacteria, UA: NONE SEEN
Bilirubin Urine: NEGATIVE
Glucose, UA: NEGATIVE mg/dL
Hgb urine dipstick: NEGATIVE
Ketones, ur: NEGATIVE mg/dL
Leukocytes,Ua: NEGATIVE
Nitrite: NEGATIVE
Protein, ur: NEGATIVE mg/dL
Specific Gravity, Urine: 1.008 (ref 1.005–1.030)
Squamous Epithelial / HPF: NONE SEEN (ref 0–5)
pH: 6 (ref 5.0–8.0)

## 2021-05-03 LAB — SALICYLATE LEVEL: Salicylate Lvl: <7 mg/dL — ABNORMAL LOW (ref 7.0–30.0)

## 2021-05-03 LAB — ACETAMINOPHEN LEVEL: Acetaminophen (Tylenol), Serum: <10 ug/mL — ABNORMAL LOW (ref 10–30)

## 2021-05-03 LAB — RESP PANEL BY RT-PCR (FLU A&B, COVID) ARPGX2
Influenza A by PCR: NEGATIVE
Influenza B by PCR: NEGATIVE
SARS Coronavirus 2 by RT PCR: NEGATIVE

## 2021-05-03 MED ORDER — THIAMINE HCL 100 MG/ML IJ SOLN: 100.0000 mg | Freq: Every day | INTRAMUSCULAR | Status: DC

## 2021-05-03 MED ORDER — LORAZEPAM 2 MG/ML IJ SOLN: 0.0000 mg | Freq: Two times a day (BID) | INTRAMUSCULAR | Status: DC

## 2021-05-03 MED ORDER — LORAZEPAM 2 MG PO TABS
0.0000 mg | ORAL_TABLET | Freq: Four times a day (QID) | ORAL | Status: DC
  Administered 2021-05-03: 2 mg via ORAL
  Filled 2021-05-03: qty 1

## 2021-05-03 MED ORDER — THIAMINE HCL 100 MG PO TABS
100.0000 mg | ORAL_TABLET | Freq: Every day | ORAL | Status: DC
  Administered 2021-05-03: 100 mg via ORAL
  Filled 2021-05-03: qty 1

## 2021-05-03 MED ORDER — LORAZEPAM 2 MG/ML IJ SOLN: 0.0000 mg | Freq: Four times a day (QID) | INTRAMUSCULAR | Status: DC

## 2021-05-03 MED ORDER — LORAZEPAM 2 MG PO TABS: 0.0000 mg | ORAL_TABLET | Freq: Two times a day (BID) | ORAL | Status: DC

## 2021-05-03 NOTE — ED Notes (Signed)
Pt given tissues as requested.
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
## 2021-05-03 NOTE — ED Notes (Signed)
Patient is discharged to home via self. He is calm and cooperative. Discharge instruction reviewed and patient verbalized understanding. No issues.

## 2021-05-03 NOTE — ED Notes (Signed)
Pt tearful again.

## 2021-05-03 NOTE — ED Notes (Signed)
Pt not yet back to stretcher from triage.

## 2021-05-03 NOTE — ED Notes (Signed)
Provider Tamala Julian notified via secure chat of alcohol level 393. Pt becoming anxious again and is agitated intermittently. Pt showing signs of experiencing illusions. Pt requesting something for his congestion. Provider Tamala Julian notified.

## 2021-05-03 NOTE — ED Provider Notes (Signed)
[facility] Emergency Department Provider Note  ____________________________________________   Event Date/Time   First MD Initiated Contact with Patient 05/03/21 1724     (approximate)  I have reviewed the triage vital signs and the nursing notes.   HISTORY  Chief Complaint Alcohol Intoxication   HPI Miguel [name] is a 60 y.o. male with a past medical history of anxiety, asthma, GERD, gout, HTN, kidney stones, OCD and alcohol abuse who presents to EMS for assessment of some congestion and burning with urination that seems to started yesterday.  Patient appears slightly intoxicated on exam.  He is very tangential and tearful and denies any other clear acute complaints.  Denies any SI or HI.  Denies any recent injuries or falls, vomiting, chest pain, diarrhea, dysuria, rash or other clear acute complaints at this time.  He does note he recently started drinking heavily again and seems fairly guilty about having feeling that he can adequately take care of his wife who is a Marine scientist.  When asked if there is anything else I can help him to the emergency room patient becomes tearful but cannot identify anything else.         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    Right ankle pain 06/26/2020   Family history of breast cancer    Hx of colonic polyps    Hospital discharge follow-up 06/05/2020   Kidney function test abnormal 06/05/2020   Abdominal pain 04/12/2020   Hypertensive urgency 04/12/2020    Alcohol withdrawal ([place]) 03/04/2020   Right-sided chest wall pain 02/28/2020   Alcohol abuse with alcohol-induced mood disorder ([place]) 12/02/2019   Moderate episode of recurrent major depressive disorder ([place])    Health care maintenance 10/26/2019   Right knee pain 10/26/2019   Generalized anxiety disorder 10/14/2019   MDD (major depressive disorder), recurrent severe, without psychosis ([place]) 07/27/2019   Social anxiety disorder 07/27/2019   Left-sided chest wall pain 59/07/3111   Alcoholic cirrhosis of liver without ascites ([place]) 06/14/2019   Alcohol abuse 06/14/2019   AKI (acute kidney injury) ([place]) 05/27/2019   Alcohol withdrawal syndrome without complication ([place]) 16/24/4695   Alcoholic intoxication without complication ([place])    Sepsis ([place]) 03/08/2019   Breast lump or mass 10/04/2018   Sepsis secondary to UTI ([place]) 09/14/2018   Asthma 07/07/2018   GERD (gastroesophageal reflux disease) 07/07/2018   OCD (obsessive compulsive disorder) 06/06/5749   Self-inflicted laceration of left wrist ([place]) 03/10/2018   Leg hematoma 12/25/2017   Suicide and self-inflicted injury by cutting and piercing instrument ([place]) 03/10/2017   Severe recurrent major 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 ([place]) 03/09/2017   Substance induced mood disorder ([place]) 08/15/2016   Involuntary commitment 08/15/2016   Alcohol use disorder, severe, dependence ([place]) 02/05/2016   Hypertension 12/05/2015   Tachycardia 12/05/2015   Gout 11/13/2015   Chronic back pain 05/02/2015  Past Surgical History:  Procedure Laterality Date   CHOLECYSTECTOMY  2012   COLONOSCOPY     COLONOSCOPY WITH PROPOFOL N/A 05/28/2020   Procedure: COLONOSCOPY WITH PROPOFOL;  Surgeon: [name], MD;  Location: [facility] ENDOSCOPY;  Service: Gastroenterology;  Laterality: N/A;   EXTRACORPOREAL SHOCK WAVE LITHOTRIPSY Left 01/12/2019   Procedure: EXTRACORPOREAL SHOCK WAVE LITHOTRIPSY (ESWL);  Surgeon: Billey Co, MD;  Location: [facility] ORS;   Service: Urology;  Laterality: Left;   UPPER GI ENDOSCOPY      Prior to Admission medications   Medication Sig Start Date End Date Taking? Authorizing Provider  ADVAIR DISKUS 100-50 MCG/ACT AEPB INHALE 1 PUFF 2 TIMES A DAY. RINSE MOUTH AND SPIT AFTER EACH USE. 06/12/20 06/19/21  Tawni Millers, MD  allopurinol (ZYLOPRIM) 300 MG tablet TAKE ONE TABLET BY MOUTH EVERY DAY 04/15/21 11/01/21  Iloabachie, Chioma E, NP  ascorbic acid (VITAMIN C) 500 MG tablet Take by mouth. 08/01/19   [provider]  folic acid (FOLVITE) 1 MG tablet TAKE ONE TABLET BY MOUTH EVERY DAY 03/21/21 03/21/22  Iloabachie, Chioma E, NP  lisinopril (ZESTRIL) 40 MG tablet TAKE ONE TABLET BY MOUTH EVERY DAY 04/15/21 04/15/22  Iloabachie, Chioma E, NP  loratadine (CLARITIN) 10 MG tablet Take 10 mg by mouth daily.    [provider]  metoprolol tartrate (LOPRESSOR) 25 MG tablet Take 1 tablet (25 mg total) by mouth daily. 04/15/21 05/15/21  Iloabachie, Chioma E, NP  mirtazapine (REMERON) 15 MG tablet Take 1 tablet (15 mg total) by mouth at bedtime. 04/15/21 05/15/21  Iloabachie, Chioma E, NP  mometasone (NASONEX) 50 MCG/ACT nasal spray Place 2 sprays into the nose daily. 04/13/21 04/13/22  Fisher, Linden Dolin, PA-C  pantoprazole (PROTONIX) 40 MG tablet TAKE 1 TABLET (40 MG TOTAL) BY MOUTH DAILY. 10/15/20 10/15/21  Iloabachie, Chioma E, NP  predniSONE (DELTASONE) 10 MG tablet Take 3 tablets (30 mg total) by mouth daily with breakfast. 04/13/21   Caryn Section Linden Dolin, PA-C  thiamine (VITAMIN B-1) 100 MG tablet TAKE ONE TABLET BY MOUTH EVERY DAY 10/22/20 10/22/21  Iloabachie, Chioma E, NP  VENTOLIN HFA 108 (90 Base) MCG/ACT inhaler INHALE 2 PUFFS EVERY 4 HOURS AS NEEDED 10/22/20 10/22/21  Tawni Millers, MD    Allergies Patient has no known allergies.  Family History  Problem Relation Age of Onset   Alcohol abuse Father    Breast cancer Mother 75   Anxiety disorder Brother    Other Maternal Grandmother        unknown medical history    Other Maternal Grandfather        unknow medical history   Other Paternal Grandmother        unknown medical history   Other Paternal Grandfather        unknown medical history   Healthy Brother     Social History Social History   Tobacco Use   Smoking status: Former    Pack years: 0.00    Types: Cigarettes    Quit date: 03/19/1981    Years since quitting: 40.1   Smokeless tobacco: Never  Vaping Use   Vaping Use: Never used  Substance Use Topics   Alcohol use: Yes    Alcohol/week: 12.0 standard drinks    Types: 12 Shots of liquor per week    Comment: last use 03/11/21 (since last hospitilization)   Drug use: No    Review of Systems  Review of Systems  Constitutional:  Negative for chills and fever.  HENT:  Positive for congestion. Negative for sore throat.   Eyes:  Negative for pain.  Respiratory:  Negative for cough and stridor.   Cardiovascular:  Negative for chest pain.  Gastrointestinal:  Negative for vomiting.  Genitourinary:  Positive for dysuria.  Musculoskeletal:  Negative for myalgias.  Skin:  Negative for rash.  Neurological:  Negative for seizures, loss of consciousness and headaches.  Psychiatric/Behavioral:  Positive for substance abuse. Negative for suicidal ideas. The patient is nervous/anxious.   All other systems reviewed and are negative.    ____________________________________________   PHYSICAL EXAM:  VITAL SIGNS: ED Triage Vitals  Enc Vitals Group     BP 05/03/21 1715 (!) 133/91     Pulse Rate 05/03/21 1715 (!) 102     Resp 05/03/21 1715 16     Temp 05/03/21 1715 97.6 F (36.4 C)     Temp Source 05/03/21 1715 Oral     SpO2 05/03/21 1715 96 %     Weight 05/03/21 1712 192 lb (87.1 kg)     Height 05/03/21 1712 6' (1.829 m)     Head Circumference --      Peak Flow --      Pain Score --      Pain Loc --      Pain Edu? --      Excl. in [place]? --    Vitals:   05/03/21 1715 05/03/21 1938  BP: (!) 133/91 (!) 145/90  Pulse: (!) 102 97   Resp: 16 18  Temp: 97.6 F (36.4 C) 97.6 F (36.4 C)  SpO2: 96% 97%   Physical Exam Vitals and nursing note reviewed.  Constitutional:      Appearance: He is well-developed.  HENT:     Head: Normocephalic and atraumatic.     Right Ear: External ear normal.     Left Ear: External ear normal.     Nose: Nose normal.  Eyes:     Conjunctiva/sclera: Conjunctivae normal.  Cardiovascular:     Rate and Rhythm: Normal rate and regular rhythm.     Heart sounds: No murmur heard. Pulmonary:     Effort: Pulmonary effort is normal. No respiratory distress.     Breath sounds: Normal breath sounds.  Abdominal:     Palpations: Abdomen is soft.     Tenderness: There is no abdominal tenderness.  Musculoskeletal:     Cervical back: Neck supple.  Skin:    General: Skin is warm and dry.  Neurological:     Mental Status: He is alert.  Psychiatric:        Mood and Affect: Mood is depressed. Affect is tearful.        Speech: Speech is slurred.        Thought Content: Thought content does not include homicidal or suicidal ideation.     ____________________________________________   LABS (all labs ordered are listed, but only abnormal results are displayed)  Labs Reviewed  COMPREHENSIVE METABOLIC PANEL - Abnormal; Notable for the following components:      Result Value   Chloride 112 (*)    Glucose, Bld 112 (*)    Calcium 8.8 (*)    AST 11 (*)    All other components within normal limits  ETHANOL - Abnormal; Notable for the following components:   Alcohol, Ethyl (B) 393 (*)    All other components within normal limits  SALICYLATE LEVEL - Abnormal; Notable for the following components:   Salicylate Lvl <3.8 (*)    All other components  within normal limits  ACETAMINOPHEN LEVEL - Abnormal; Notable for the following components:   Acetaminophen (Tylenol), Serum <10 (*)    All other components within normal limits  CBC - Abnormal; Notable for the following components:   Hemoglobin 12.3  (*)    HCT 37.4 (*)    RDW 18.6 (*)    All other components within normal limits  URINE DRUG SCREEN, QUALITATIVE ([facility] ONLY) - Abnormal; Notable for the following components:   Cannabinoid 50 Ng, Ur [place] POSITIVE (*)    Benzodiazepine, Ur Scrn POSITIVE (*)    All other components within normal limits  RESP PANEL BY RT-PCR (FLU A&B, COVID) [id number]   ____________________________________________  EKG  ____________________________________________  RADIOLOGY  ED MD interpretation:    Official radiology report(s): No results found.  ____________________________________________   PROCEDURES  Procedure(s) performed (including Critical Care):  Procedures   ____________________________________________   INITIAL IMPRESSION / ASSESSMENT AND PLAN / ED COURSE      Patient presents with above-stated history and exam intoxicated complaining of some urination and congestion.  He denies any other clear acute complaints.  He is not suicidal homicidal and denies any hallucinations.  On arrival he is afebrile hemodynamically stable.  Is nonfocal neuro exam.  His abdomen is soft nontender and he has clear lungs bilaterally.  With heart is congestion there is no findings on history or exam to suggest deep patient positioning in the head or neck.  Suspect likely viral URI.  His lungs are clear bilaterally and is no fever or leukocytosis to suggest a bacterial pneumonia at this time.  CMP shows no significant electrode or metabolic derangements.  CBC is otherwise unremarkable.  COVID and influenza is negative.  Serum ethanol did return at 393.  Patient was observed for approximately 3 hours emergency room.  On reassessment he is ambulating around the emergency room with steady gait unassisted requesting to leave.  Advised him to add on a urinalysis to rule out urinary tract infection but he states he actually does not have any burning with urination still wishes to leave he states he can follow this  result up online and we can add it if we want to coagulase.  We will add a UA.  Advised patient that I recommended he stay until this results but he states he wished to leave and I think his capacity make this decision after 3-hour observation of metabolism.  He again denies any SI or HI.  Offered Librium although he states he is not interested in this at this time.  Advise close outpatient [facility] and PCP follow-up.  Discharge stable condition.  Strict precautions advised and discussed.       ____________________________________________   FINAL CLINICAL IMPRESSION(S) / ED DIAGNOSES  Final diagnoses:  Alcoholic intoxication without complication (HCC)  Sinus congestion  Dysuria    Medications  LORazepam (ATIVAN) injection 0-4 mg ( Intravenous See Alternative 05/03/21 1850)    Or  LORazepam (ATIVAN) tablet 0-4 mg (2 mg Oral Given 05/03/21 1850)  LORazepam (ATIVAN) injection 0-4 mg (has no administration in time range)    Or  LORazepam (ATIVAN) tablet 0-4 mg (has no administration in time range)  thiamine tablet 100 mg (100 mg Oral Given 05/03/21 1850)    Or  thiamine (B-1) injection 100 mg ( Intravenous See Alternative 05/03/21 1850)     ED Discharge Orders     None        Note:  This document was prepared using Dragon voice recognition  software and may include unintentional dictation errors.    [name], MD 05/03/21 2004

## 2021-05-03 NOTE — ED Triage Notes (Signed)
Pt to ED via [facility]. Per EMS pt denies SI/HI at this time. Per EMS pt reports "My head doesn't feels right", unable to obtain further information. EMS reports +ETOH use prior to their arrival.

## 2021-05-03 NOTE — ED Notes (Signed)
Report to include Situation, Background, Assessment, and Recommendations received from [place] RN. Patient alert and oriented, warm and dry, in no acute distress. Patient denies SI, HI, AVH and pain. Patient is here for alcohol intoxication.  Patient made aware of Q15 minute rounds and Engineer, drilling presence for their safety. Patient instructed to come to me with needs or concerns.

## 2021-05-12 ENCOUNTER — Other Ambulatory Visit: Payer: Self-pay

## 2021-05-13 ENCOUNTER — Ambulatory Visit: Payer: Medicaid Other | Admitting: Gerontology

## 2021-05-15 IMAGING — CR DG CHEST 2V
1 series · 2 of 2 positions shown · non-contrast
Comparison: June 10, 2020

CLINICAL DATA: Status post fall.

EXAM:
CHEST - 2 VIEW

[Series 1: dg chest 2 view · 0.14mm/px · 2 of 2 slices shown]
[im 1/2]
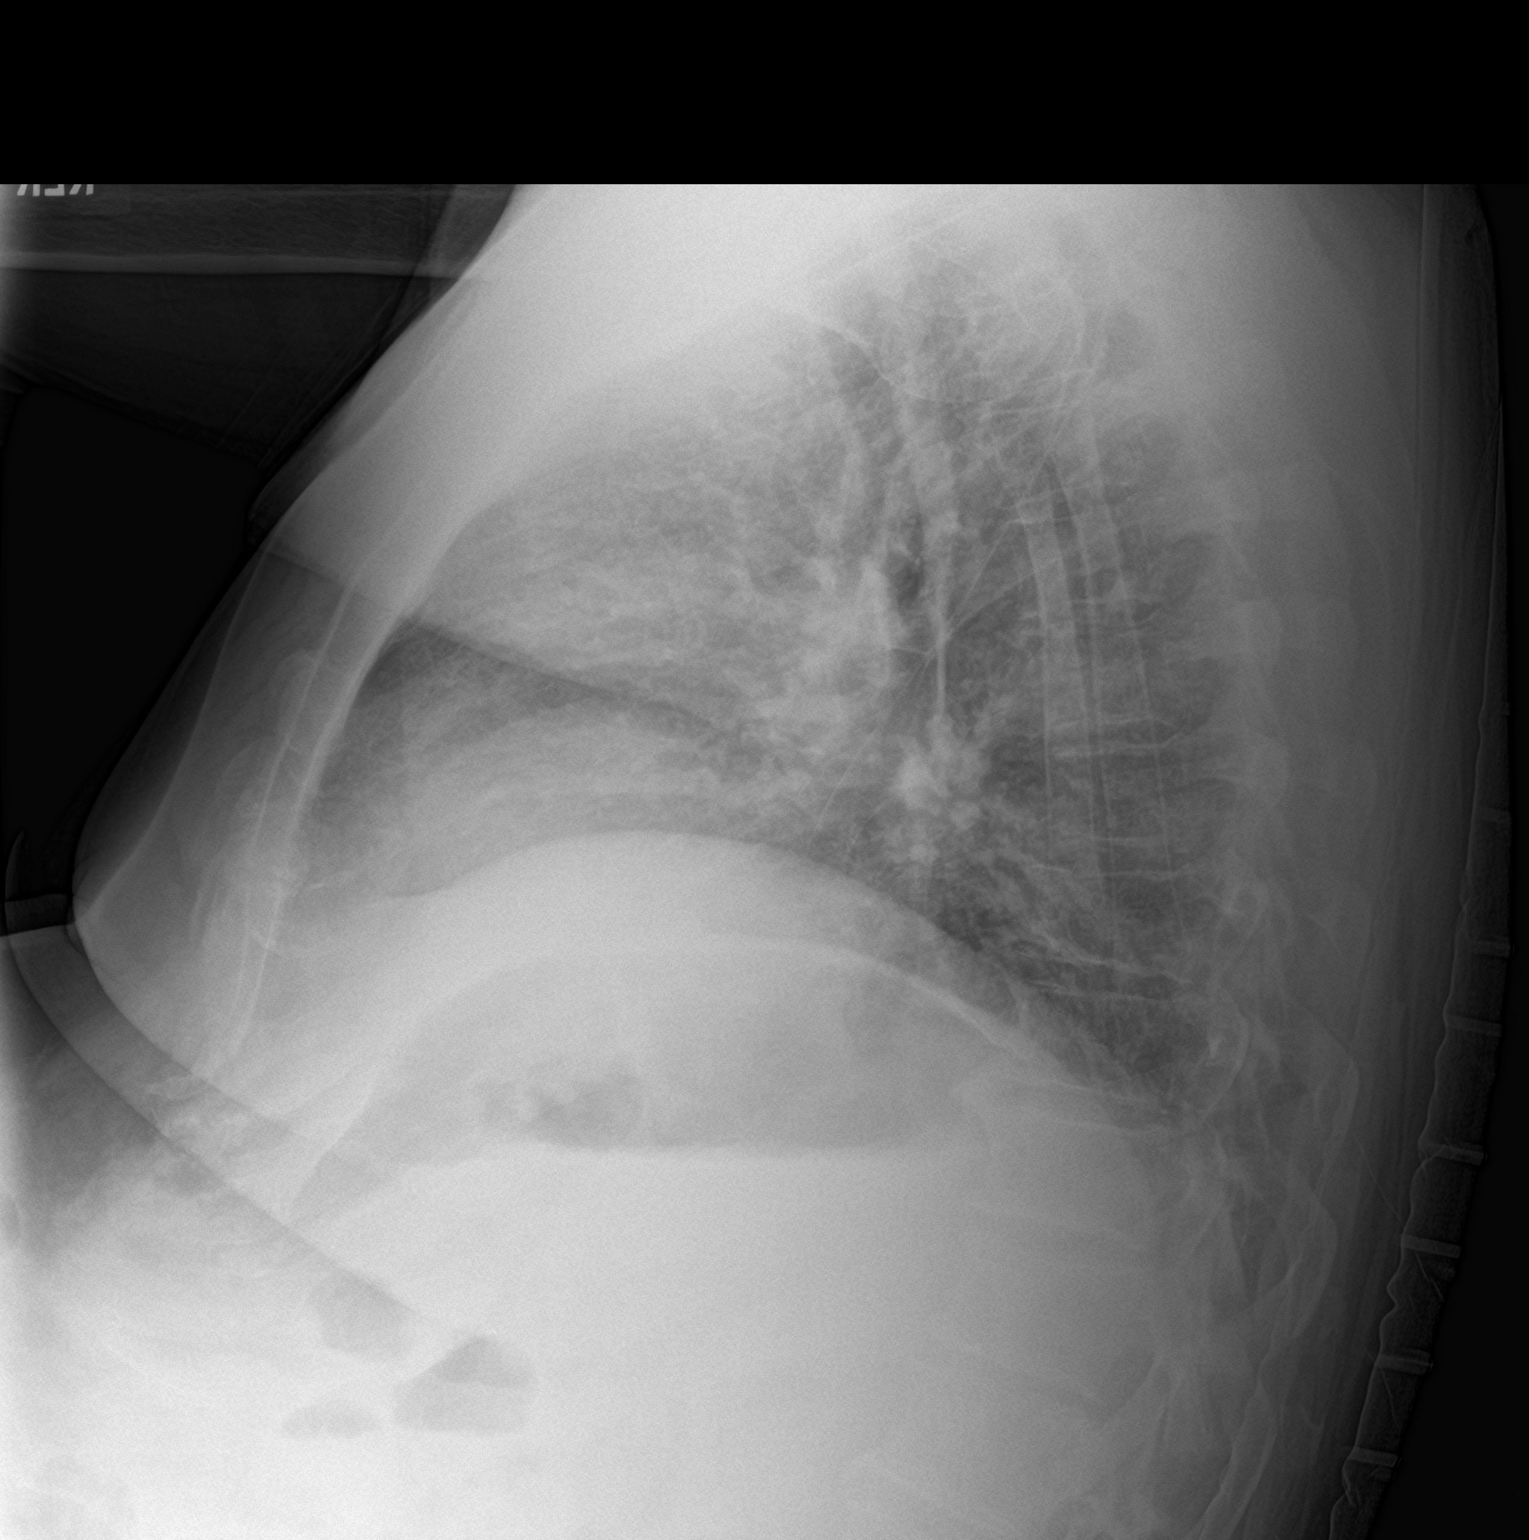
[im 2/2]
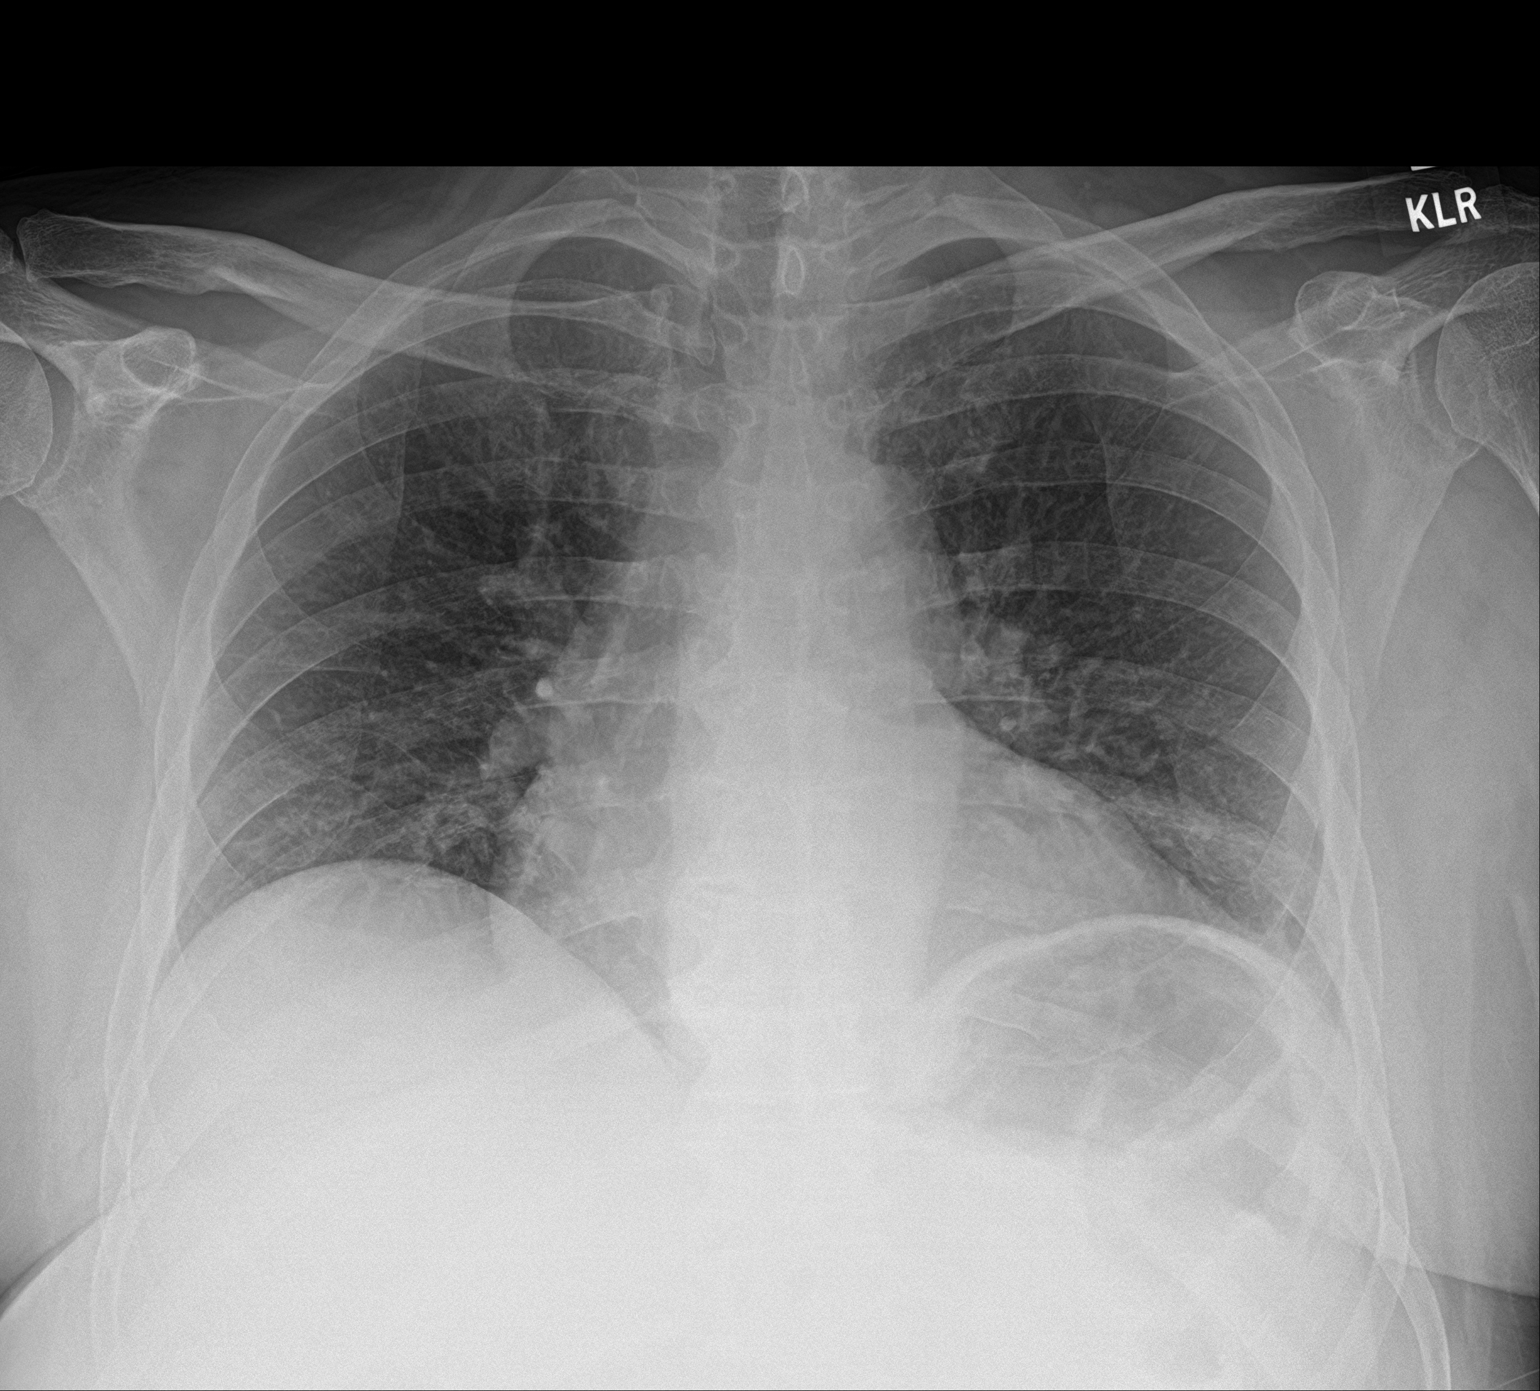

[2 of 2 positions shown; findings below may reference images not displayed]

FINDINGS: The heart size and mediastinal contours are within normal limits.
Both lungs are clear. The visualized skeletal structures are
unremarkable.
IMPRESSION: No active cardiopulmonary disease.

## 2021-05-15 IMAGING — CT CT HEAD W/O CM
3 series · 15 of 47 positions shown, 18 images · non-contrast
Comparison: Prior head CT 03/22/2020.

CLINICAL DATA: Mental status change, unknown cause; fall/altered
mental status.

EXAM:
CT HEAD WITHOUT CONTRAST
TECHNIQUE: Contiguous axial images were obtained from the base of the skull
through the vertex without intravenous contrast.

[Series 2: head wo · axial · 0.47mm/px · z∈[+326,+461]mm · 9 of 33 slices shown, 12 images]
[im 3/33  brain]
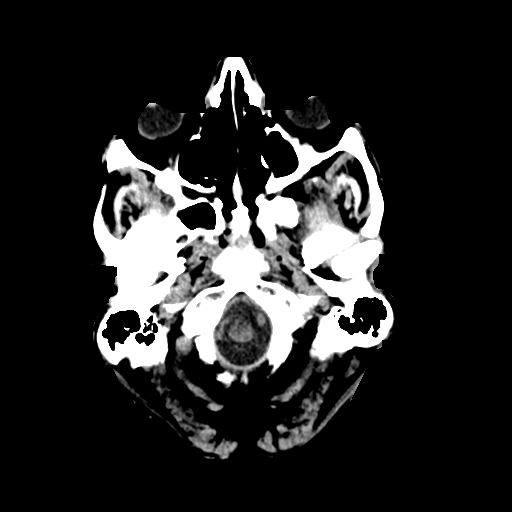
[im 3/33  bone]
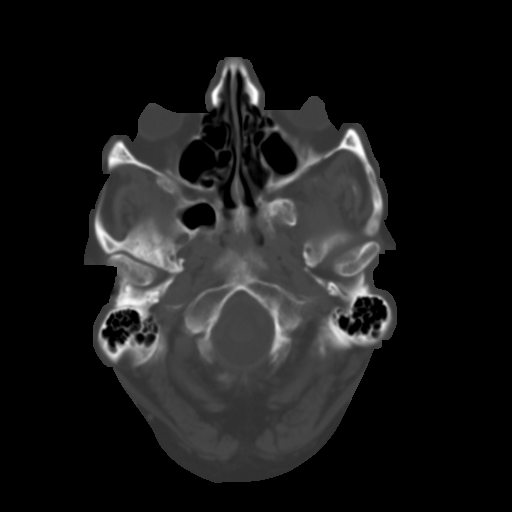
[im 6/33  brain]
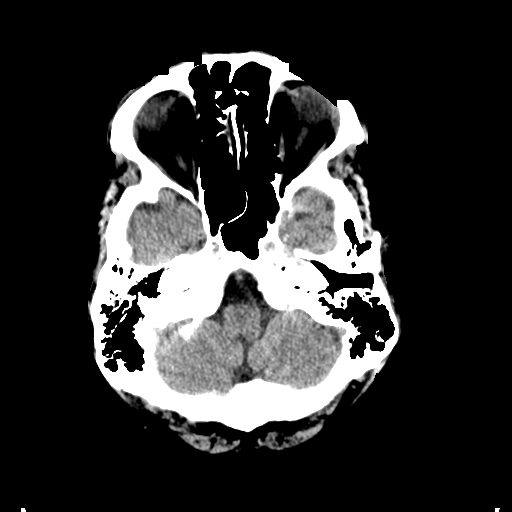
[im 9/33  brain]
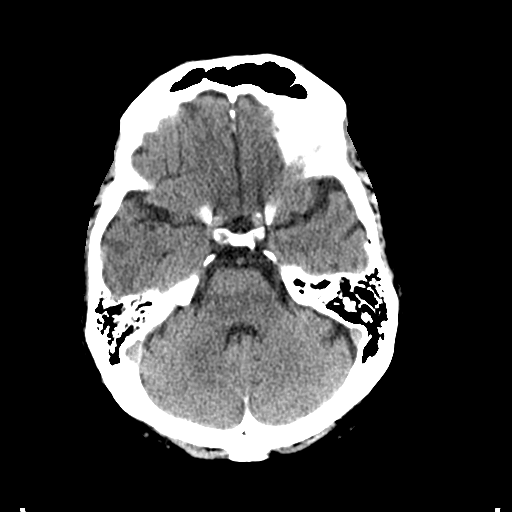
[im 13/33  brain]
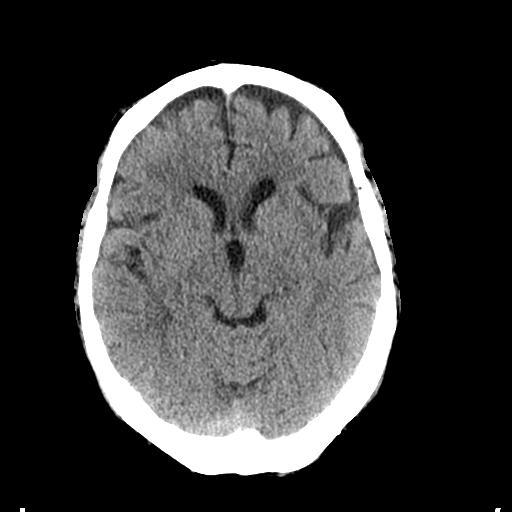
[im 17/33  brain]
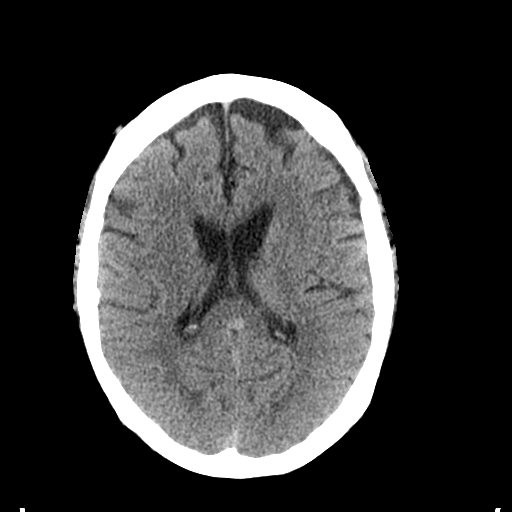
[im 17/33  bone]
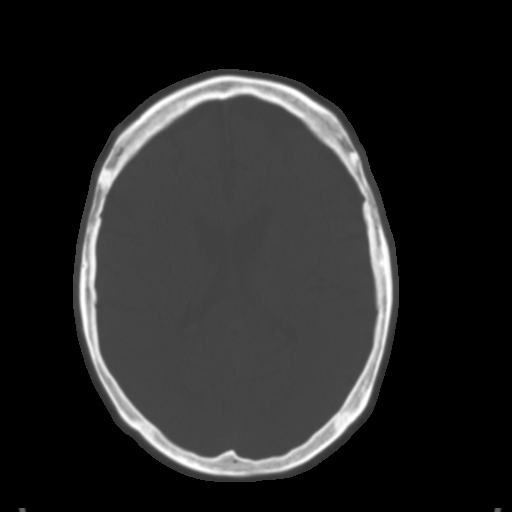
[im 20/33  brain]
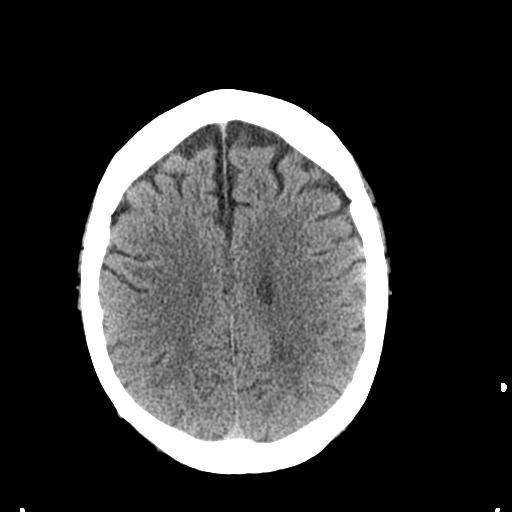
[im 24/33  brain]
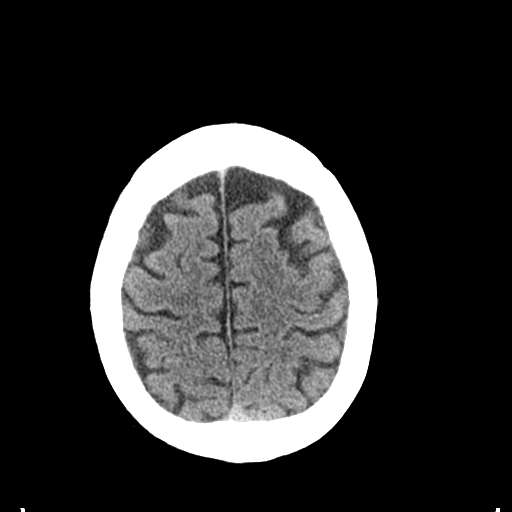
[im 27/33  brain]
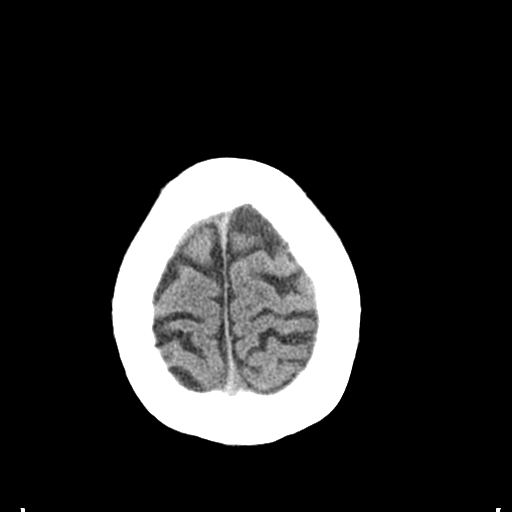
[im 30/33  brain]
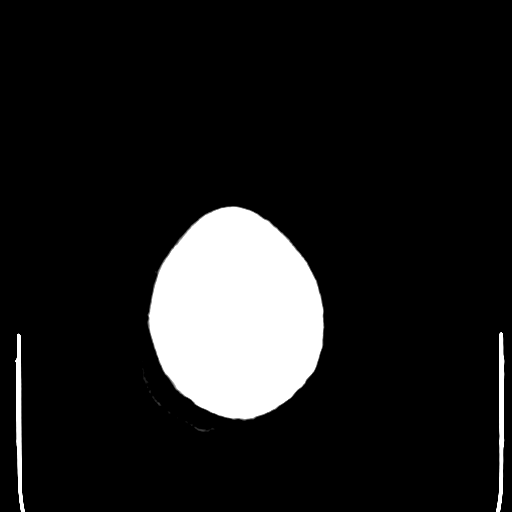
[im 30/33  bone]
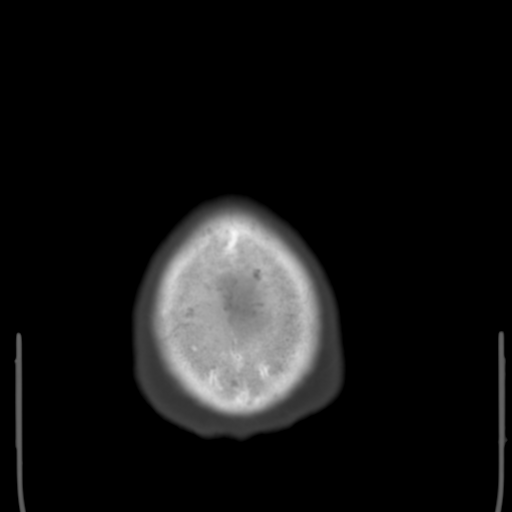

[Series 4: coronal soft tissue · coronal · 0.32mm/px · 3 of 72 slices shown]
[im 24/72  brain]
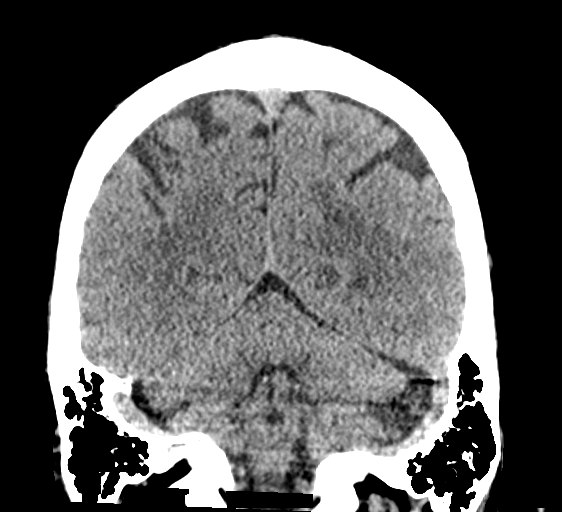
[im 32/72  brain]
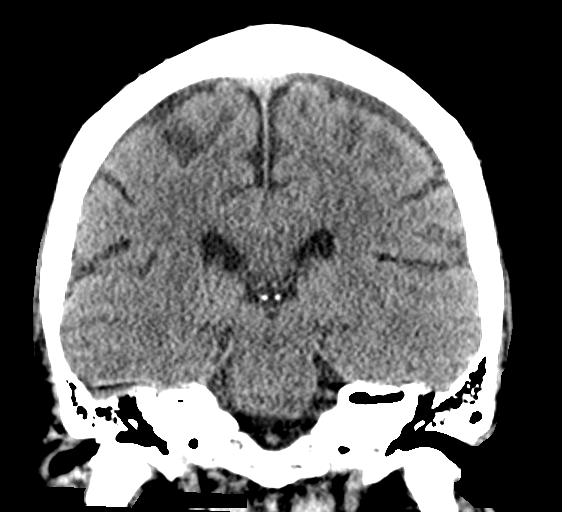
[im 40/72  brain]
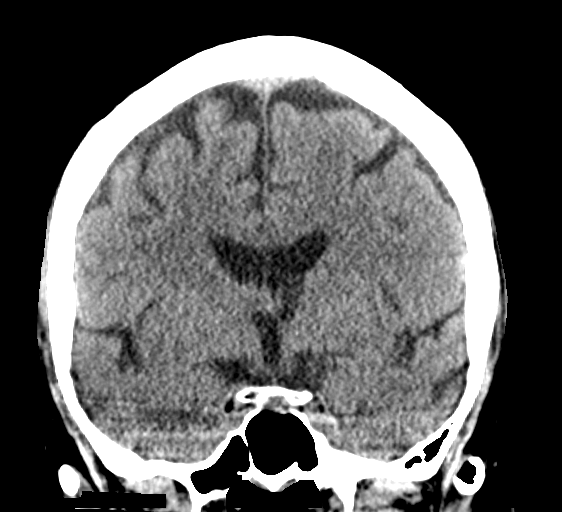

[Series 5: sagittal soft tissue · sagittal · 0.32mm/px · 3 of 61 slices shown]
[im 21/61  brain]
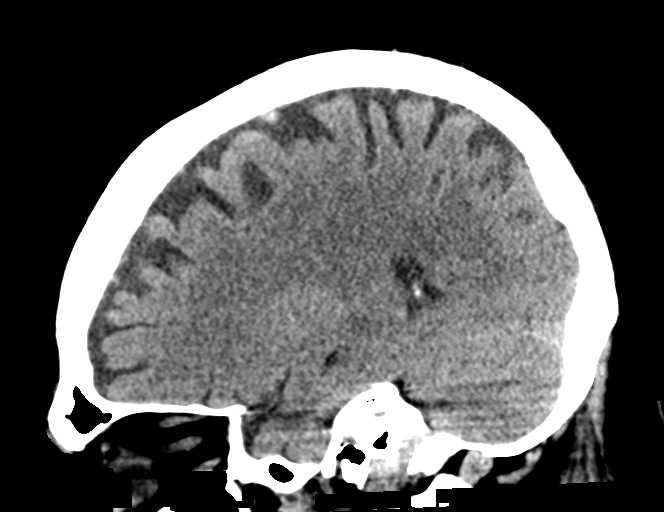
[im 31/61  brain]
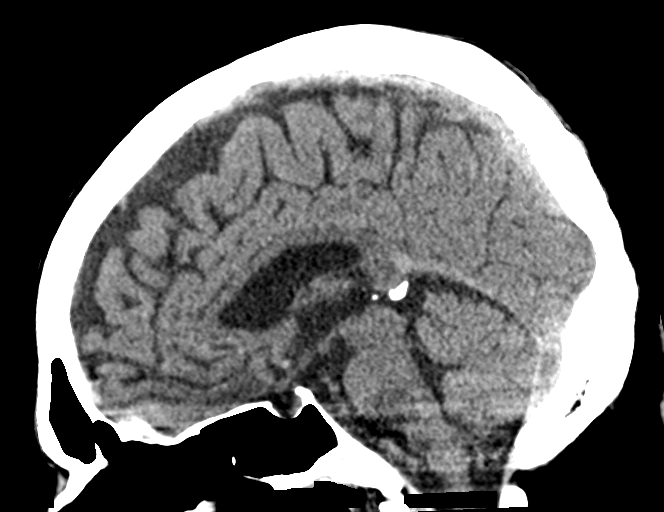
[im 41/61  brain]
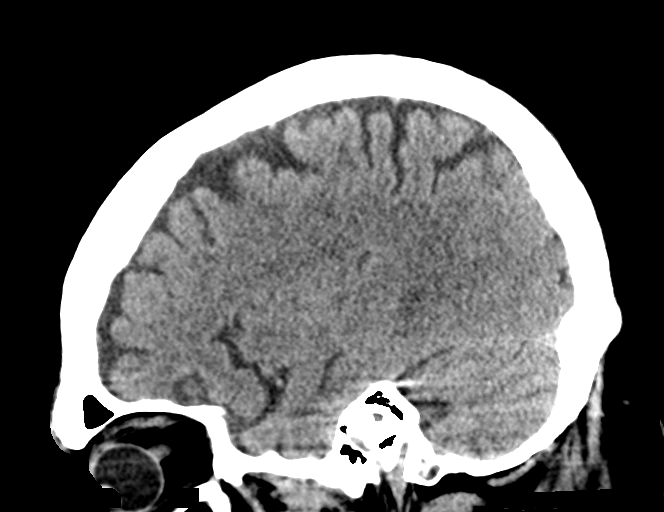

[15 of 47 positions shown; findings below may reference images not displayed]

FINDINGS: Brain:

Stable, mild generalized cerebral atrophy.

There is no acute intracranial hemorrhage.

No demarcated cortical infarct.

No extra-axial fluid collection.

No evidence of intracranial mass.

No midline shift.

Vascular: No hyperdense vessel.  Atherosclerotic calcifications.

Skull: Normal. Negative for fracture or focal lesion.

Sinuses/Orbits: Visualized orbits show no acute finding. Tiny mucous
retention cyst within the posterior right maxillary sinus. Mild
maxillary sinus mucosal thickening at the imaged levels. No
significant mastoid effusion
IMPRESSION: No evidence of acute intracranial abnormality.

Stable, mild generalized cerebral atrophy.

Small right ethmoid sinus mucous retention cyst. Mild maxillary
sinus mucosal thickening.

## 2021-05-16 ENCOUNTER — Other Ambulatory Visit: Payer: Self-pay

## 2021-05-16 ENCOUNTER — Emergency Department
Admission: EM | Admit: 2021-05-16 | Discharge: 2021-05-16 | Disposition: A | Discharge status: Home / Self Care | Payer: Medicaid Other | Attending: Emergency Medicine | Admitting: Emergency Medicine

## 2021-05-16 DIAGNOSIS — J45909 Unspecified asthma, uncomplicated: Secondary | ICD-10-CM | POA: Insufficient documentation

## 2021-05-16 DIAGNOSIS — M545 Low back pain, unspecified: Secondary | ICD-10-CM | POA: Insufficient documentation

## 2021-05-16 DIAGNOSIS — Z79899 Other long term (current) drug therapy: Secondary | ICD-10-CM | POA: Insufficient documentation

## 2021-05-16 DIAGNOSIS — R0981 Nasal congestion: Secondary | ICD-10-CM | POA: Insufficient documentation

## 2021-05-16 DIAGNOSIS — Z87891 Personal history of nicotine dependence: Secondary | ICD-10-CM | POA: Insufficient documentation

## 2021-05-16 DIAGNOSIS — I10 Essential (primary) hypertension: Secondary | ICD-10-CM | POA: Insufficient documentation

## 2021-05-16 DIAGNOSIS — Z7952 Long term (current) use of systemic steroids: Secondary | ICD-10-CM | POA: Insufficient documentation

## 2021-05-16 MED ORDER — HYDROXYZINE HCL 10 MG PO TABS
10.0000 mg | ORAL_TABLET | Freq: Three times a day (TID) | ORAL | 0 refills | Status: DC | PRN
  Filled 2021-05-16: qty 15, 5d supply, fill #0

## 2021-05-16 MED ORDER — FLUTICASONE PROPIONATE 50 MCG/ACT NA SUSP
1.0000 | Freq: Every day | NASAL | 0 refills | Status: DC
  Filled 2021-05-16: qty 16, 60d supply, fill #0

## 2021-05-16 NOTE — Discharge Instructions (Addendum)
You can administer one spray of Flonase each side daily for seven days. You can take Hydroxyzine up to three times daily.

## 2021-05-16 NOTE — ED Provider Notes (Signed)
[facility]-EMERGENCY DEPARTMENT  ____________________________________________  Time seen: Approximately 3:48 PM  I have reviewed the triage vital signs and the nursing notes.   HISTORY  Chief Complaint Back Pain and Sinus Problem   Historian Patient     HPI Miguel [name] is a 60 y.o. male presents to the emergency department primarily complaining of nasal congestion.  Patient reports that he took Sudafed last night and had difficulty sleeping.  Patient states that he feels like his neck and back hurts today as he has been in bed longer than usual trying to sleep.  He has had some rhinorrhea.  No nonproductive cough.  Patient reports sweats at home but is unsure of fever.  No chest pain, chest tightness or abdominal pain.  No other alleviating measures have been attempted.   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    Right ankle pain 06/26/2020   Family history of breast cancer    Hx of colonic polyps    Hospital discharge follow-up 06/05/2020   Kidney function test abnormal 06/05/2020   Abdominal pain 04/12/2020    Hypertensive urgency 04/12/2020   Alcohol withdrawal ([place]) 03/04/2020   Right-sided chest wall pain 02/28/2020   Alcohol abuse with alcohol-induced mood disorder ([place]) 12/02/2019   Moderate episode of recurrent major depressive disorder ([place])    Health care maintenance 10/26/2019   Right knee pain 10/26/2019   Generalized anxiety disorder 10/14/2019   MDD (major depressive disorder), recurrent severe, without psychosis ([place]) 07/27/2019   Social anxiety disorder 07/27/2019   Left-sided chest wall pain 04/59/9774   Alcoholic cirrhosis of liver without ascites ([place] ) 06/14/2019   Alcohol abuse 06/14/2019   AKI (acute kidney injury) ([place]) 05/27/2019   Alcohol withdrawal syndrome without complication ([place]) 14/23/9532   Alcoholic intoxication without complication ([place])    Sepsis ([place]) 03/08/2019   Breast lump or mass 10/04/2018   Sepsis secondary to UTI ([place]) 09/14/2018   Asthma 07/07/2018   GERD (gastroesophageal reflux disease) 07/07/2018   OCD (obsessive compulsive disorder) 02/33/4356   Self-inflicted laceration of left wrist ([place]) 03/10/2018   Leg hematoma 12/25/2017   Suicide and self-inflicted injury by cutting and piercing instrument ([place]) 03/10/2017   Severe recurrent major 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 ([place]) 03/09/2017   Substance induced mood disorder ([place]) 08/15/2016   Involuntary commitment 08/15/2016  Alcohol use disorder, severe, dependence ([place]) 02/05/2016   Hypertension 12/05/2015   Tachycardia 12/05/2015   Gout 11/13/2015   Chronic back pain 05/02/2015    Past Surgical History:  Procedure Laterality Date   CHOLECYSTECTOMY  2012   COLONOSCOPY     COLONOSCOPY WITH PROPOFOL N/A 05/28/2020   Procedure: COLONOSCOPY WITH PROPOFOL;  Surgeon: [name], MD;  Location: [facility] ENDOSCOPY;  Service: Gastroenterology;  Laterality: N/A;   EXTRACORPOREAL SHOCK WAVE LITHOTRIPSY Left 01/12/2019   Procedure: EXTRACORPOREAL SHOCK WAVE LITHOTRIPSY (ESWL);  Surgeon: Billey Co, MD;  Location: [facility] ORS;  Service: Urology;  Laterality: Left;   UPPER GI ENDOSCOPY      Prior to Admission medications   Medication Sig Start Date End Date Taking? Authorizing Provider  fluticasone (FLONASE) 50 MCG/ACT nasal spray Place 1 spray into both nostrils daily. 05/16/21 05/16/22 Yes Vallarie Mare M, PA-C  hydrOXYzine (ATARAX/VISTARIL) 10 MG tablet Take 1 tablet (10 mg total) by mouth 3 (three) times daily as needed for up to 5 days. 05/16/21 05/21/21 Yes Markee Matera M, PA-C  ADVAIR DISKUS 100-50 MCG/ACT AEPB INHALE 1 PUFF 2 TIMES A DAY. RINSE MOUTH AND SPIT AFTER EACH USE. 06/12/20 06/19/21  Tawni Millers, MD  allopurinol (ZYLOPRIM) 300 MG tablet TAKE ONE TABLET BY MOUTH EVERY DAY 04/15/21 11/01/21  Iloabachie, Chioma E, NP  ascorbic acid (VITAMIN C) 500 MG tablet Take by mouth. 08/01/19   [provider]  folic acid (FOLVITE) 1 MG tablet TAKE ONE TABLET BY MOUTH EVERY DAY 03/21/21 03/21/22  Iloabachie, Chioma E, NP  lisinopril (ZESTRIL) 40 MG tablet TAKE ONE TABLET BY MOUTH EVERY DAY 04/15/21 04/15/22  Iloabachie, Chioma E, NP  loratadine (CLARITIN) 10 MG tablet Take 10 mg by mouth daily.    [provider]  metoprolol tartrate (LOPRESSOR) 25 MG tablet Take 1 tablet (25 mg total) by mouth daily. 04/15/21 05/15/21  Iloabachie, Chioma E, NP  mirtazapine (REMERON) 15 MG tablet Take 1 tablet (15 mg total) by mouth at bedtime. 04/15/21 05/15/21  Iloabachie, Chioma E, NP  mometasone (NASONEX) 50 MCG/ACT nasal spray Place 2 sprays into the nose daily. 04/13/21 04/13/22  Fisher, Linden Dolin, PA-C  pantoprazole (PROTONIX) 40 MG tablet TAKE 1 TABLET (40 MG TOTAL) BY MOUTH DAILY. 10/15/20 10/15/21  Iloabachie, Chioma E, NP  thiamine (VITAMIN B-1) 100 MG tablet TAKE ONE TABLET BY MOUTH EVERY DAY 10/22/20 10/22/21  Iloabachie, Chioma E, NP  VENTOLIN HFA 108 (90 Base) MCG/ACT inhaler INHALE 2 PUFFS EVERY 4 HOURS AS NEEDED 10/22/20 10/22/21  Tawni Millers, MD    Allergies Patient has no known  allergies.  Family History  Problem Relation Age of Onset   Alcohol abuse Father    Breast cancer Mother 28   Anxiety disorder Brother    Other Maternal Grandmother        unknown medical history   Other Maternal Grandfather        unknow medical history   Other Paternal Grandmother        unknown medical history   Other Paternal Grandfather        unknown medical history   Healthy Brother     Social History Social History   Tobacco Use   Smoking status: Former    Pack years: 0.00    Types: Cigarettes    Quit date: 03/19/1981    Years since quitting: 40.1   Smokeless tobacco: Never  Vaping Use   Vaping Use: Never used  Substance Use Topics  Alcohol use: Not Currently    Alcohol/week: 12.0 standard drinks    Types: 12 Shots of liquor per week    Comment: last use 03/11/21 (since last hospitilization)   Drug use: No     Review of Systems  Constitutional: No fever/chills Eyes:  No discharge ENT: Patient has nasal congestion. Respiratory: no cough. No SOB/ use of accessory muscles to breath Gastrointestinal:   No nausea, no vomiting.  No diarrhea.  No constipation. Musculoskeletal: Negative for musculoskeletal pain. Skin: Negative for rash, abrasions, lacerations, ecchymosis.    ____________________________________________   PHYSICAL EXAM:  VITAL SIGNS: ED Triage Vitals  Enc Vitals Group     BP 05/16/21 1451 (!) 128/91     Pulse Rate 05/16/21 1451 (!) 112     Resp 05/16/21 1451 17     Temp 05/16/21 1451 98 F (36.7 C)     Temp Source 05/16/21 1451 Oral     SpO2 05/16/21 1451 98 %     Weight 05/16/21 1452 190 lb (86.2 kg)     Height 05/16/21 1452 6' (1.829 m)     Head Circumference --      Peak Flow --      Pain Score 05/16/21 1451 5     Pain Loc --      Pain Edu? --      Excl. in [place]? --      Constitutional: Alert and oriented. Well appearing and in no acute distress. Eyes: Conjunctivae are normal. PERRL. EOMI. Head: Atraumatic. No maxillary or  frontal sinus tenderness.  ENT:      Ears: Tms are pearly.       Nose: No congestion/rhinnorhea.      Mouth/Throat: Mucous membranes are moist.  Neck: No stridor.  No cervical spine tenderness to palpation. Cardiovascular: Normal rate, regular rhythm. Normal S1 and S2.  Good peripheral circulation. Respiratory: Normal respiratory effort without tachypnea or retractions. Lungs CTAB. Good air entry to the bases with no decreased or absent breath sounds Gastrointestinal: Bowel sounds x 4 quadrants. Soft and nontender to palpation. No guarding or rigidity. No distention. Musculoskeletal: Full range of motion to all extremities. No obvious deformities noted Neurologic:  Normal for age. No gross focal neurologic deficits are appreciated.  Skin:  Skin is warm, dry and intact. No rash noted. Psychiatric: Mood and affect are normal for age. Speech and behavior are normal.   ____________________________________________   LABS (all labs ordered are listed, but only abnormal results are displayed)  Labs Reviewed - No data to display ____________________________________________  EKG   ____________________________________________  RADIOLOGY   No results found.  ____________________________________________    PROCEDURES  Procedure(s) performed:     Procedures     Medications - No data to display   ____________________________________________   INITIAL IMPRESSION / ASSESSMENT AND PLAN / ED COURSE  Pertinent labs & imaging results that were available during my care of the patient were reviewed by me and considered in my medical decision making (see chart for details).      Assessment and Plan:  Nasal congestion 60 year old male presents to the emergency department with concern for persistent nasal congestion and difficulty sleeping after starting Sudafed.  He reports that Sudafed did help with his nasal congestion.  Patient was mildly tachycardic at triage but vital  signs were otherwise reassuring.  On exam, patient was alert, active and nontoxic-appearing.  Started patient on Flonase and hydroxyzine and advised patient to follow-up with his PCP as needed.  ____________________________________________  FINAL CLINICAL IMPRESSION(S) / ED DIAGNOSES  Final diagnoses:  Nasal congestion      NEW MEDICATIONS STARTED DURING THIS VISIT:  ED Discharge Orders          Ordered    hydrOXYzine (ATARAX/VISTARIL) 10 MG tablet  3 times daily PRN        05/16/21 1542    fluticasone (FLONASE) 50 MCG/ACT nasal spray  Daily        05/16/21 1542                This chart was dictated using voice recognition software/Dragon. Despite best efforts to proofread, errors can occur which can change the meaning. Any change was purely unintentional.     Lannie Fields, PA-C 05/16/21 1551    Harvest Dark, MD 05/16/21 2253

## 2021-05-16 NOTE — ED Notes (Signed)
See triage note  Presents with sinus pressure and neck /back pain  No fever

## 2021-05-16 NOTE — ED Triage Notes (Signed)
Pt c/o sinus congestion for the past couple for days and having flare with chronic neck and back pain

## 2021-05-19 IMAGING — CR DG ANKLE COMPLETE 3+V*R*
3 series · 3 of 3 positions shown · non-contrast
Comparison: Right ankle radiographs 06/11/2020

CLINICAL DATA: Pt states whacked lateral side of ankle on bed this
morning. Having lateral side right ankle pain. Hx of fracture.

EXAM:
RIGHT ANKLE - COMPLETE 3+ VIEW

[ankle ap]
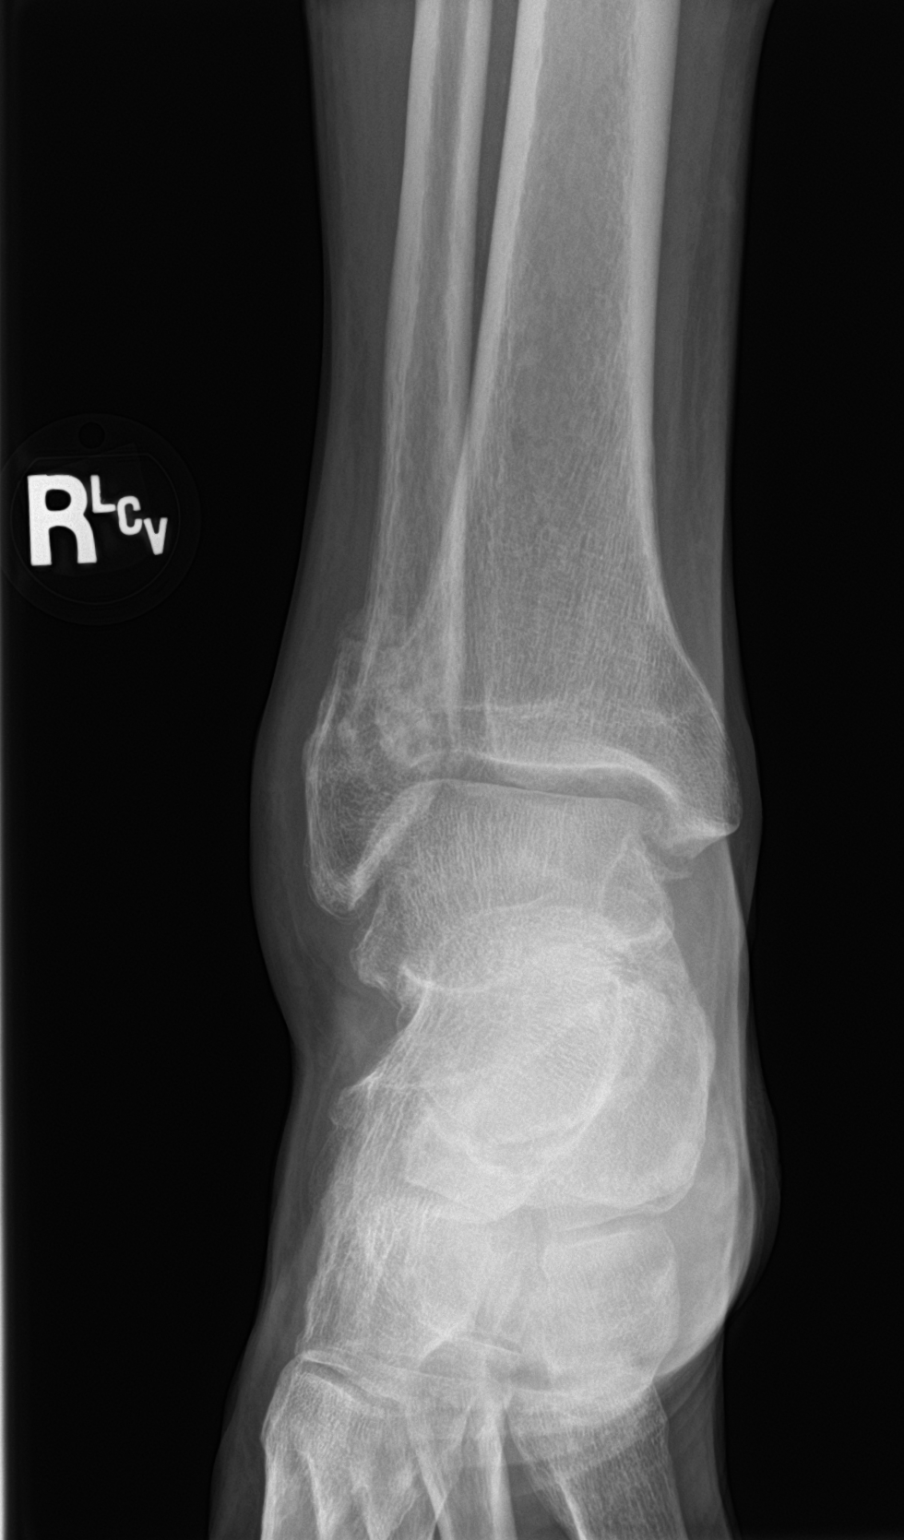

[ankle obl]
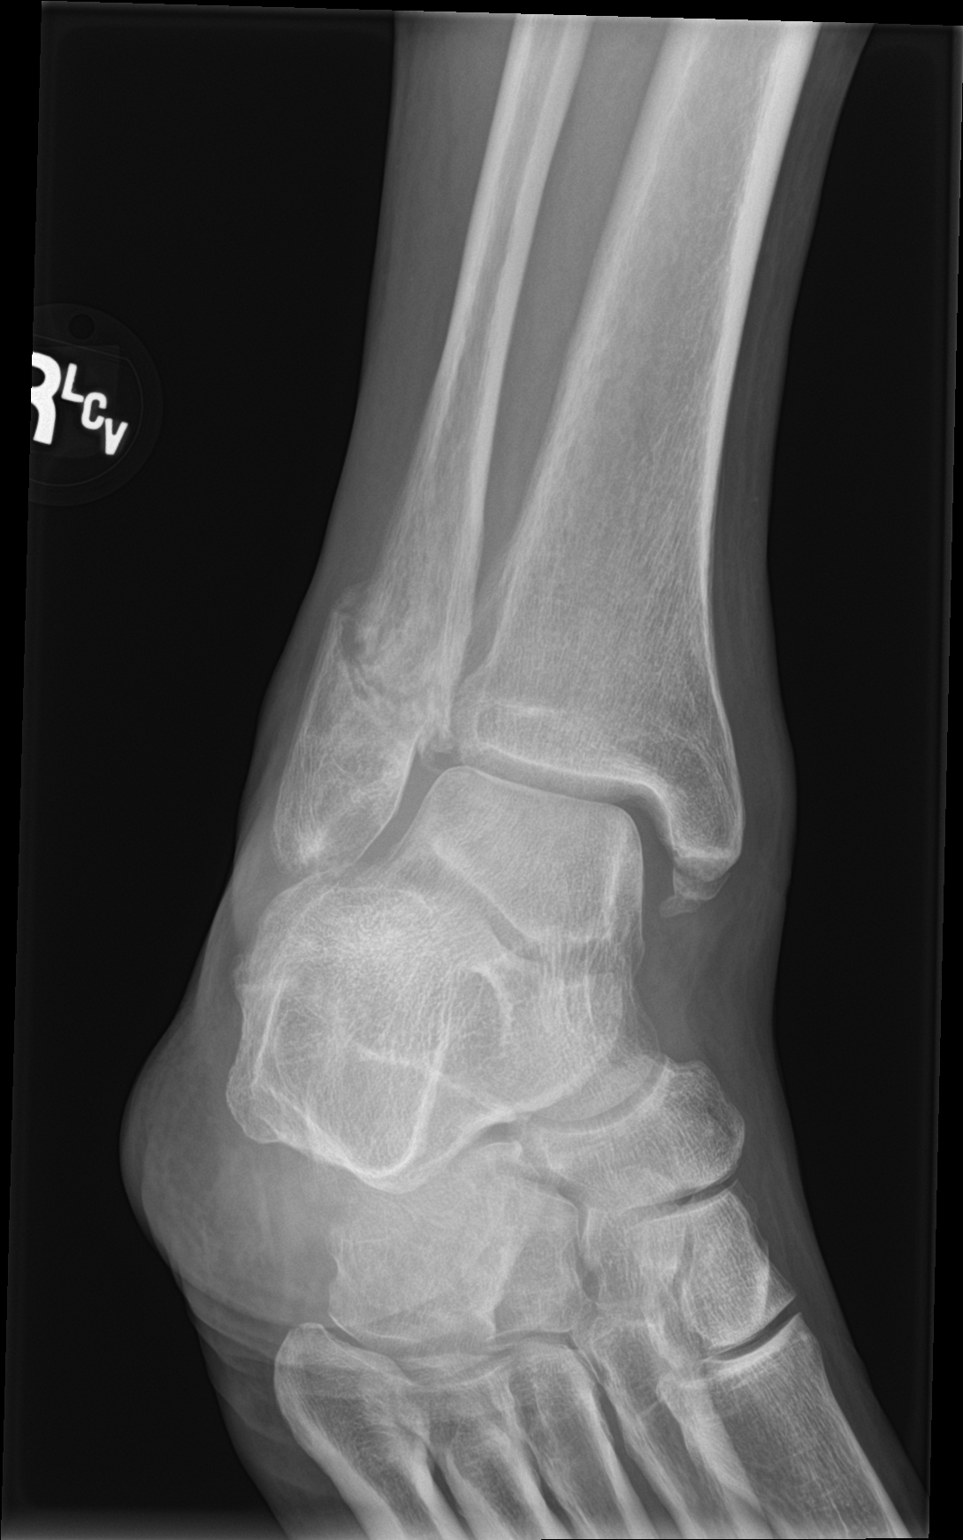

[ankle lat]
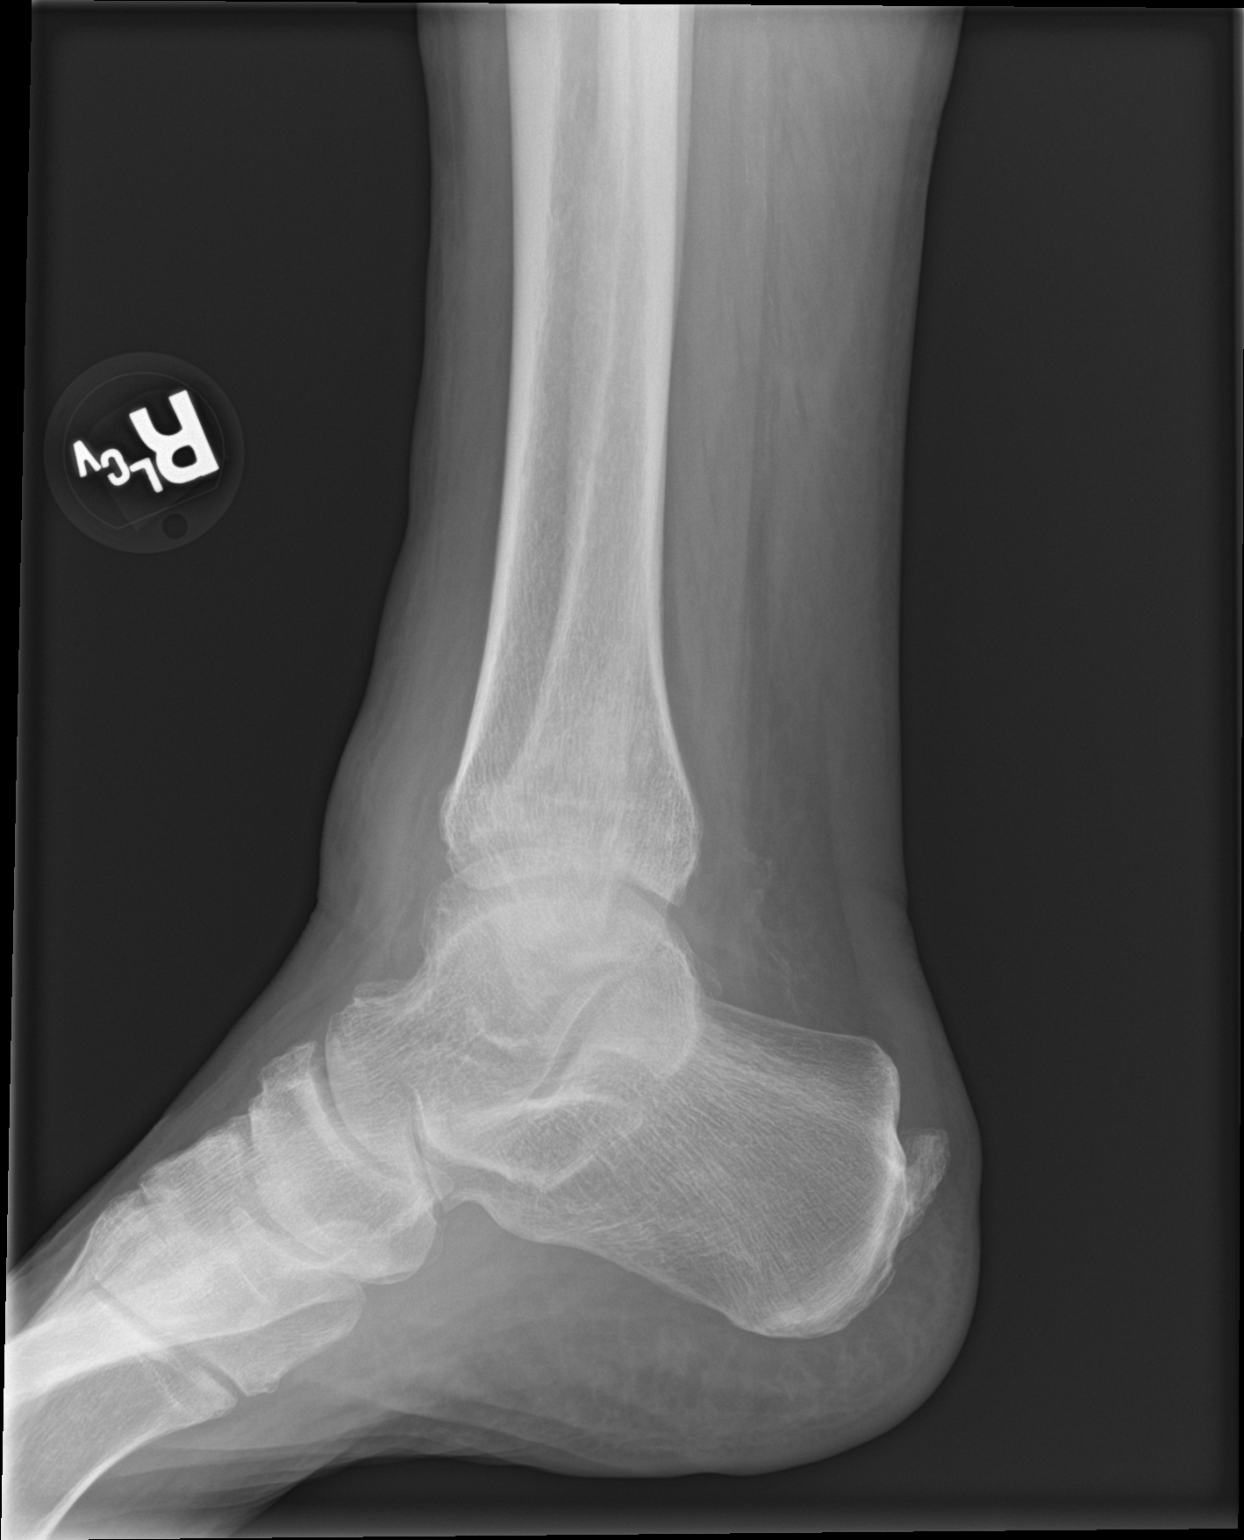

[3 of 3 positions shown; findings below may reference images not displayed]

FINDINGS: Redemonstrated mildly displaced bimalleolar fractures. No
significant change in fracture alignment. There is sclerosis and
bony proliferation in the distal fibula consistent with ongoing
healing. Fracture lucency persists. There are irregular margins at
the fracture site in the medial malleolus consistent with healing
though fracture lucency remains. No new fracture identified. There
is regional soft tissue swelling.
IMPRESSION: Ongoing healing of bimalleolar ankle fractures. No significant
change in fracture alignment. No new fracture identified.

## 2021-05-19 IMAGING — CR DG CHEST 2V
2 series · 2 of 2 positions shown · non-contrast
Comparison: Chest radiograph 08/14/2020

CLINICAL DATA: Pt states whacked lateral side of ankle on bed this
morning. Having lateral side right ankle pain. Hx of fracture.

EXAM:
CHEST - 2 VIEW

[chest lat]
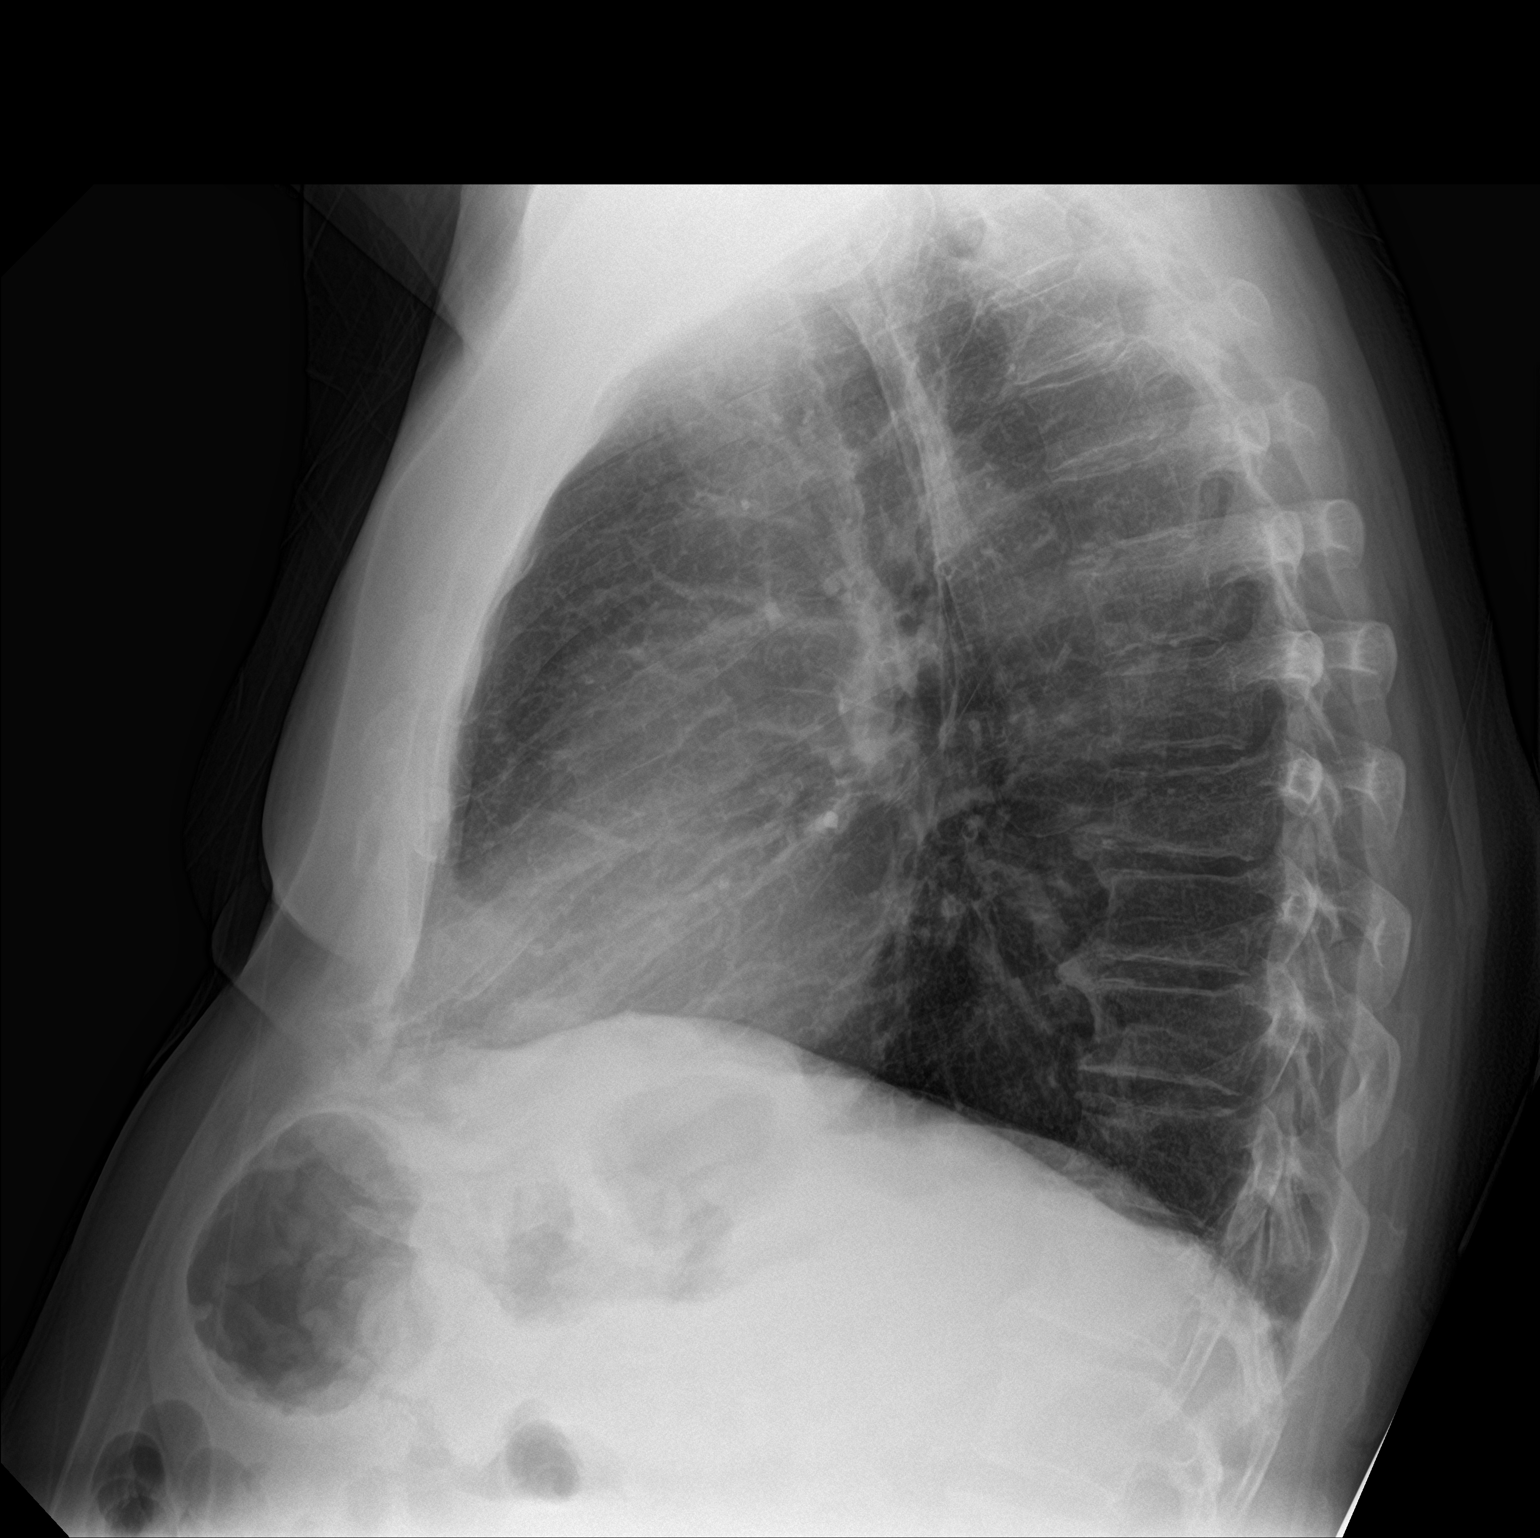

[chest ap]
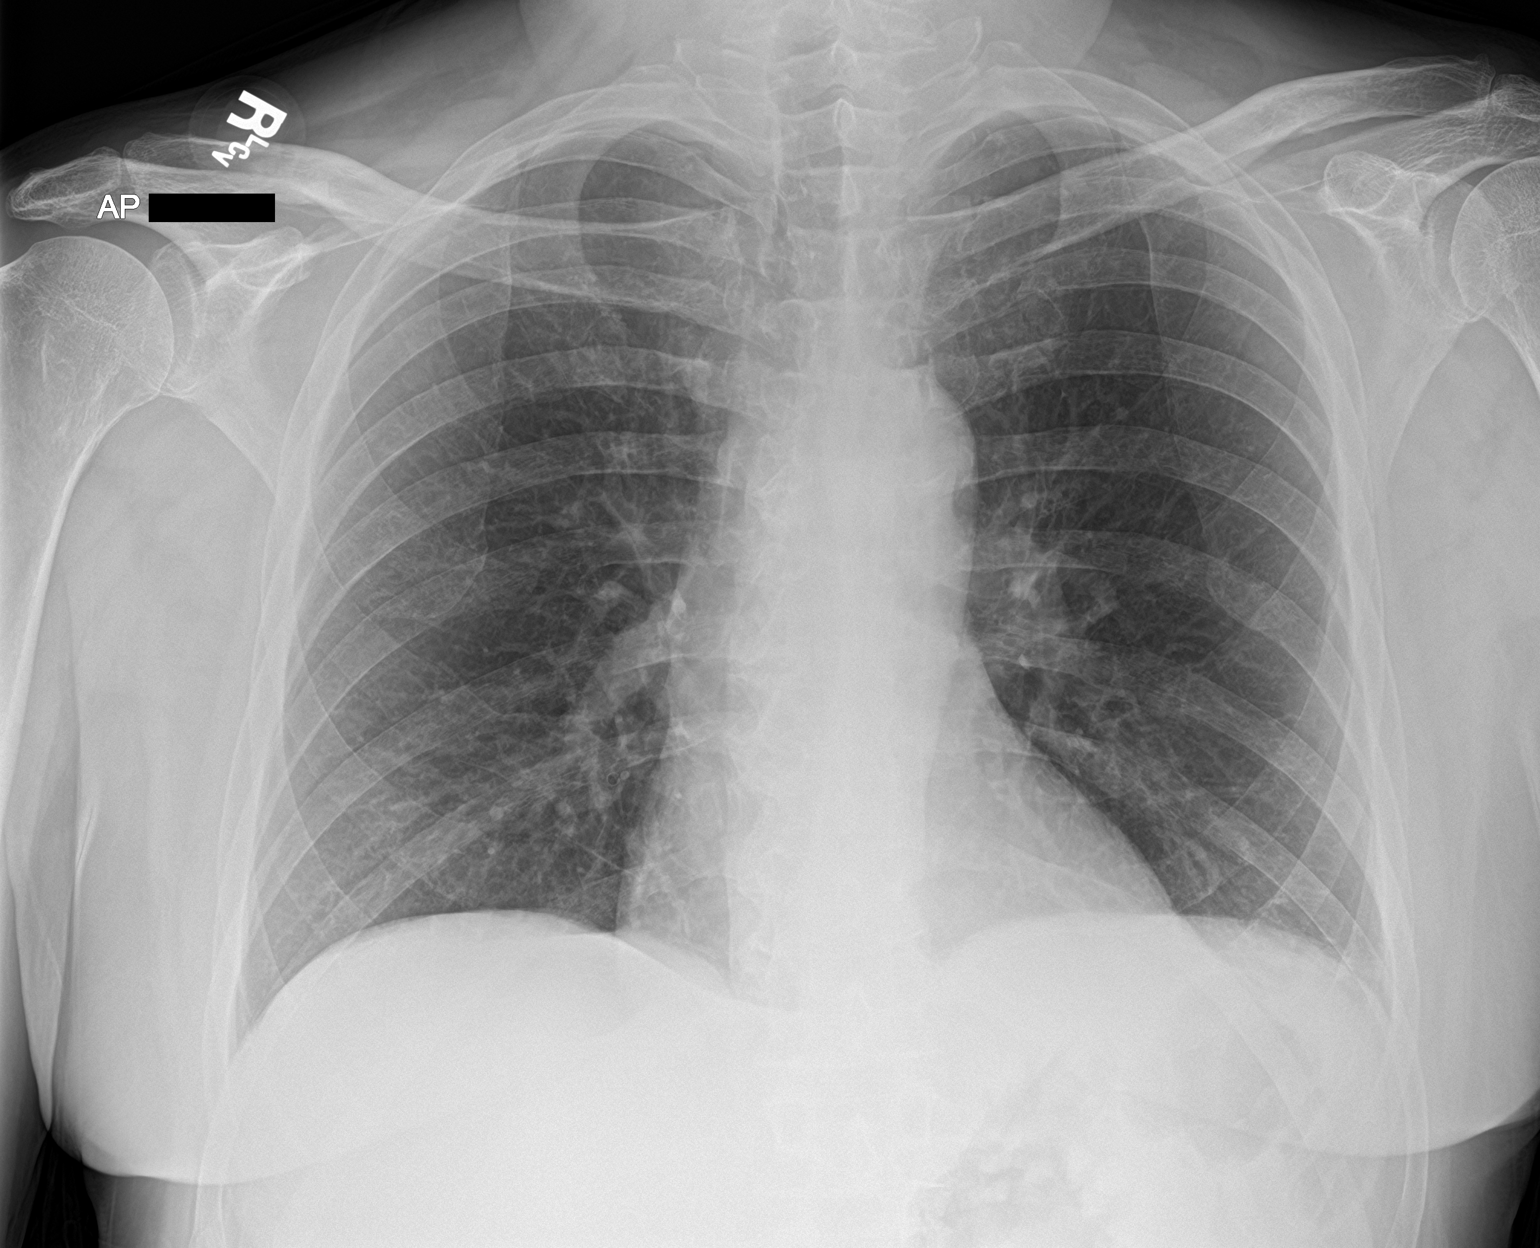

[2 of 2 positions shown; findings below may reference images not displayed]

FINDINGS: The cardiomediastinal contours are within normal limits. The lungs
are clear. No pneumothorax or pleural effusion. No acute finding in
the visualized skeleton.
IMPRESSION: No acute cardiopulmonary process.

## 2021-05-21 ENCOUNTER — Encounter: Payer: Self-pay | Admitting: Gerontology

## 2021-05-21 ENCOUNTER — Other Ambulatory Visit: Payer: Self-pay

## 2021-05-21 ENCOUNTER — Ambulatory Visit: Payer: Medicaid Other | Admitting: Gerontology

## 2021-05-21 VITALS — BP 142/88 | HR 87 | Temp 97.9°F | Resp 16 | Ht 72.0 in | Wt 193.5 lb

## 2021-05-21 DIAGNOSIS — F411 Generalized anxiety disorder: Secondary | ICD-10-CM

## 2021-05-21 DIAGNOSIS — F1010 Alcohol abuse, uncomplicated: Secondary | ICD-10-CM

## 2021-05-21 DIAGNOSIS — I10 Essential (primary) hypertension: Secondary | ICD-10-CM

## 2021-05-21 MED ORDER — MIRTAZAPINE 15 MG PO TABS
15.0000 mg | ORAL_TABLET | Freq: Every day | ORAL | 0 refills | Status: DC
Start: 2021-05-21 — End: 2022-01-13
  Filled 2021-05-21: qty 30, 30d supply, fill #0

## 2021-05-21 MED ORDER — METOPROLOL TARTRATE 25 MG PO TABS
25.0000 mg | ORAL_TABLET | Freq: Every day | ORAL | 3 refills | Status: DC
  Filled 2021-05-21: qty 30, 30d supply, fill #0

## 2021-05-21 MED ORDER — HYDROXYZINE HCL 10 MG PO TABS
10.0000 mg | ORAL_TABLET | Freq: Three times a day (TID) | ORAL | 0 refills | Status: DC | PRN
  Filled 2021-05-21: qty 15, 5d supply, fill #0

## 2021-05-21 MED ORDER — FOLIC ACID 1 MG PO TABS
ORAL_TABLET | Freq: Every day | ORAL | 1 refills | Status: DC
Start: 2021-05-21 — End: 2021-08-28
  Filled 2021-05-21: qty 30, 30d supply, fill #0

## 2021-05-21 NOTE — Patient Instructions (Signed)
https://www.nhlbi.nih.gov/files/docs/public/heart/dash_brief.pdf">  DASH Eating Plan DASH stands for Dietary Approaches to Stop Hypertension. The DASH eating plan is a healthy eating plan that has been shown to: Reduce high blood pressure (hypertension). Reduce your risk for type 2 diabetes, heart disease, and stroke. Help with weight loss. What are tips for following this plan? Reading food labels Check food labels for the amount of salt (sodium) per serving. Choose foods with less than 5 percent of the Daily Value of sodium. Generally, foods with less than 300 milligrams (mg) of sodium per serving fit into this eating plan. To find whole grains, look for the word "whole" as the first word in the ingredient list. Shopping Buy products labeled as "low-sodium" or "no salt added." Buy fresh foods. Avoid canned foods and pre-made or frozen meals. Cooking Avoid adding salt when cooking. Use salt-free seasonings or herbs instead of table salt or sea salt. Check with your health care provider or pharmacist before using salt substitutes. Do not fry foods. Cook foods using healthy methods such as baking, boiling, grilling, roasting, and broiling instead. Cook with heart-healthy oils, such as olive, canola, avocado, soybean, or sunflower oil. Meal planning  Eat a balanced diet that includes: 4 or more servings of fruits and 4 or more servings of vegetables each day. Try to fill one-half of your plate with fruits and vegetables. 6-8 servings of whole grains each day. Less than 6 oz (170 g) of lean meat, poultry, or fish each day. A 3-oz (85-g) serving of meat is about the same size as a deck of cards. One egg equals 1 oz (28 g). 2-3 servings of low-fat dairy each day. One serving is 1 cup (237 mL). 1 serving of nuts, seeds, or beans 5 times each week. 2-3 servings of heart-healthy fats. Healthy fats called omega-3 fatty acids are found in foods such as walnuts, flaxseeds, fortified milks, and eggs.  These fats are also found in cold-water fish, such as sardines, salmon, and mackerel. Limit how much you eat of: Canned or prepackaged foods. Food that is high in trans fat, such as some fried foods. Food that is high in saturated fat, such as fatty meat. Desserts and other sweets, sugary drinks, and other foods with added sugar. Full-fat dairy products. Do not salt foods before eating. Do not eat more than 4 egg yolks a week. Try to eat at least 2 vegetarian meals a week. Eat more home-cooked food and less restaurant, buffet, and fast food.  Lifestyle When eating at a restaurant, ask that your food be prepared with less salt or no salt, if possible. If you drink alcohol: Limit how much you use to: 0-1 drink a day for women who are not pregnant. 0-2 drinks a day for men. Be aware of how much alcohol is in your drink. In the [place] one drink equals one 12 oz bottle of beer (355 mL), one 5 oz glass of wine (148 mL), or one 1 oz glass of hard liquor (44 mL). General information Avoid eating more than 2,300 mg of salt a day. If you have hypertension, you may need to reduce your sodium intake to 1,500 mg a day. Work with your health care provider to maintain a healthy body weight or to lose weight. Ask what an ideal weight is for you. Get at least 30 minutes of exercise that causes your heart to beat faster (aerobic exercise) most days of the week. Activities may include walking, swimming, or biking. Work with your health care provider   or dietitian to adjust your eating plan to your individual calorie needs. What foods should I eat? Fruits All fresh, dried, or frozen fruit. Canned fruit in natural juice (without addedsugar). Vegetables Fresh or frozen vegetables (raw, steamed, roasted, or grilled). Low-sodium or reduced-sodium tomato and vegetable juice. Low-sodium or reduced-sodium tomatosauce and tomato paste. Low-sodium or reduced-sodium canned vegetables. Grains Whole-grain or  whole-wheat bread. Whole-grain or whole-wheat pasta. Brown rice. Oatmeal. Quinoa. Bulgur. Whole-grain and low-sodium cereals. Pita bread.Low-fat, low-sodium crackers. Whole-wheat flour tortillas. Meats and other proteins Skinless chicken or turkey. Ground chicken or turkey. Pork with fat trimmed off. Fish and seafood. Egg whites. Dried beans, peas, or lentils. Unsalted nuts, nut butters, and seeds. Unsalted canned beans. Lean cuts of beef with fat trimmed off. Low-sodium, lean precooked or cured meat, such as sausages or meatloaves. Dairy Low-fat (1%) or fat-free (skim) milk. Reduced-fat, low-fat, or fat-free cheeses. Nonfat, low-sodium ricotta or cottage cheese. Low-fat or nonfatyogurt. Low-fat, low-sodium cheese. Fats and oils Soft margarine without trans fats. Vegetable oil. Reduced-fat, low-fat, or light mayonnaise and salad dressings (reduced-sodium). Canola, safflower, olive, avocado, soybean, andsunflower oils. Avocado. Seasonings and condiments Herbs. Spices. Seasoning mixes without salt. Other foods Unsalted popcorn and pretzels. Fat-free sweets. The items listed above may not be a complete list of foods and beverages you can eat. Contact a dietitian for more information. What foods should I avoid? Fruits Canned fruit in a light or heavy syrup. Fried fruit. Fruit in cream or buttersauce. Vegetables Creamed or fried vegetables. Vegetables in a cheese sauce. Regular canned vegetables (not low-sodium or reduced-sodium). Regular canned tomato sauce and paste (not low-sodium or reduced-sodium). Regular tomato and vegetable juice(not low-sodium or reduced-sodium). Pickles. Olives. Grains Baked goods made with fat, such as croissants, muffins, or some breads. Drypasta or rice meal packs. Meats and other proteins Fatty cuts of meat. Ribs. Fried meat. Bacon. Bologna, salami, and other precooked or cured meats, such as sausages or meat loaves. Fat from the back of a pig (fatback). Bratwurst.  Salted nuts and seeds. Canned beans with added salt. Canned orsmoked fish. Whole eggs or egg yolks. Chicken or turkey with skin. Dairy Whole or 2% milk, cream, and half-and-half. Whole or full-fat cream cheese. Whole-fat or sweetened yogurt. Full-fat cheese. Nondairy creamers. Whippedtoppings. Processed cheese and cheese spreads. Fats and oils Butter. Stick margarine. Lard. Shortening. Ghee. Bacon fat. Tropical oils, suchas coconut, palm kernel, or palm oil. Seasonings and condiments Onion salt, garlic salt, seasoned salt, table salt, and sea salt. Worcestershire sauce. Tartar sauce. Barbecue sauce. Teriyaki sauce. Soy sauce, including reduced-sodium. Steak sauce. Canned and packaged gravies. Fish sauce. Oyster sauce. Cocktail sauce. Store-bought horseradish. Ketchup. Mustard. Meat flavorings and tenderizers. Bouillon cubes. Hot sauces. Pre-made or packaged marinades. Pre-made or packaged taco seasonings. Relishes. Regular saladdressings. Other foods Salted popcorn and pretzels. The items listed above may not be a complete list of foods and beverages you should avoid. Contact a dietitian for more information. Where to find more information National Heart, Lung, and Blood Institute: www.nhlbi.nih.gov American Heart Association: www.heart.org Academy of Nutrition and Dietetics: www.eatright.org National Kidney Foundation: www.kidney.org Summary The DASH eating plan is a healthy eating plan that has been shown to reduce high blood pressure (hypertension). It may also reduce your risk for type 2 diabetes, heart disease, and stroke. When on the DASH eating plan, aim to eat more fresh fruits and vegetables, whole grains, lean proteins, low-fat dairy, and heart-healthy fats. With the DASH eating plan, you should limit salt (sodium) intake to 2,300   mg a day. If you have hypertension, you may need to reduce your sodium intake to 1,500 mg a day. Work with your health care provider or dietitian to adjust  your eating plan to your individual calorie needs. This information is not intended to replace advice given to you by your health care provider. Make sure you discuss any questions you have with your healthcare provider. Document Revised: 10/06/2019 Document Reviewed: 10/06/2019 Elsevier Patient Education  2022 [employer].  

## 2021-05-21 NOTE — Progress Notes (Signed)
Established Patient Office Visit  Subjective:  Patient ID: Miguel [name] Miguel [name], male    DOB: [birth year]  Age: 60 y.o. MRN: [id number]  CC:  Chief Complaint  Patient presents with   Follow-up   Hypertension    HPI Miguel [name]  is a 60 y/o male with PMH of Alcohol abuse, Anxiety, Asthma, Gerd,presents for routine follow up visit. He was seen at the ED on 05/16/21 for nasal congestion,  currently, he states that his symptoms has improved 95%, but he experiences intermittent congestion when he lays down to sleep at night. He continues to use his Flonase . His blood pressure was elevated during visit, he checks it at home, states that he's compliant with his medications and adheres to [place]. He as also present at the ED on 05/03/21 due to alcohol intoxication, he states that he has not had any wine since his ED visit on 05/03/21. He states that he started drinking wine after he was out of Mirtazapine, and taking Hydroxyzine prescribed at the ED helps with his anxiety and he requests an appointment with [facility]. Overall, he states that he's doing well, denies withdrawal symptoms and offers no further complaint.      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    OCD (obsessive compulsive disorder)    Renal colic     Past Surgical History:  Procedure Laterality Date   CHOLECYSTECTOMY  2012   COLONOSCOPY     COLONOSCOPY WITH PROPOFOL N/A 05/28/2020   Procedure: COLONOSCOPY WITH PROPOFOL;  Surgeon: [name], MD;  Location[facility] ENDOSCOPY;  Service: Gastroenterology;  Laterality: N/A;   EXTRACORPOREAL SHOCK WAVE LITHOTRIPSY Left 01/12/2019   Procedure: EXTRACORPOREAL SHOCK WAVE LITHOTRIPSY (ESWL);  Surgeon: Billey Co, MD;   Location: [facility] ORS;  Service: Urology;  Laterality: Left;   UPPER GI ENDOSCOPY      Family History  Problem Relation Age of Onset   Alcohol abuse Father    Breast cancer Mother 45   Anxiety disorder Brother    Other Maternal Grandmother        unknown medical history   Other Maternal Grandfather        unknow medical history   Other Paternal Grandmother        unknown medical history   Other Paternal Grandfather        unknown medical history   Healthy Brother     Social History   Socioeconomic History   Marital status: Married    Spouse name: Not on file   Number of children: Not on file   Years of education: Not on file   Highest education level: Not on file  Occupational History   Not on file  Tobacco Use   Smoking status: Former    Pack years: 0.00    Types: Cigarettes    Quit date: 03/19/1981    Years since quitting: 40.2   Smokeless tobacco: Never  Vaping Use   Vaping Use: Never used  Substance and Sexual Activity   Alcohol use: Not Currently    Alcohol/week: 12.0 standard drinks    Types: 12 Shots  of liquor per week    Comment: last use 05/03/21 (none since ED visit)   Drug use: No   Sexual activity: Not Currently  Other Topics Concern   Not on file  Social History Narrative   Lives at home with his wife and takes care of his wife. Independent at baseline      - Biggest strain is financial but doesn't think they could got get more help.      Patient expressed interest in possible financial assistance. Informed written consent obtained   Social Determinants of Health   Financial Resource Strain: High Risk   Difficulty of Paying Living Expenses: Very hard  Food Insecurity: No Food Insecurity   Worried About Charity fundraiser in the Last Year: Never true   Ran Out of Food in the Last Year: Never true  Transportation Needs: No Transportation Needs   Lack of Transportation (Medical): No   Lack of Transportation (Non-Medical): No  Physical Activity:  Sufficiently Active   Days of Exercise per Week: 7 days   Minutes of Exercise per Session: 60 min  Stress: Stress Concern Present   Feeling of Stress : Very much  Social Connections: Socially Isolated   Frequency of Communication with Friends and Family: Once a week   Frequency of Social Gatherings with Friends and Family: Once a week   Attends Religious Services: Never   Marine scientist or Organizations: No   Attends Music therapist: Never   Marital Status: Married  Human resources officer Violence: Not At Risk   Fear of Current or Ex-Partner: No   Emotionally Abused: No   Physically Abused: No   Sexually Abused: No    Outpatient Medications Prior to Visit  Medication Sig Dispense Refill   ADVAIR DISKUS 100-50 MCG/ACT AEPB INHALE 1 PUFF 2 TIMES A DAY. RINSE MOUTH AND SPIT AFTER EACH USE. 180 each 3   allopurinol (ZYLOPRIM) 300 MG tablet TAKE ONE TABLET BY MOUTH EVERY DAY 90 tablet 1   ascorbic acid (VITAMIN C) 500 MG tablet Take by mouth.     fluticasone (FLONASE) 50 MCG/ACT nasal spray SPRAY ONE SPRAY INTO BOTH NOSTRILS ONCE DAILY. 16 g 0   lisinopril (ZESTRIL) 40 MG tablet TAKE ONE TABLET BY MOUTH EVERY DAY 90 tablet 1   loratadine (CLARITIN) 10 MG tablet Take 10 mg by mouth daily.     pantoprazole (PROTONIX) 40 MG tablet TAKE 1 TABLET (40 MG TOTAL) BY MOUTH DAILY. 90 tablet 1   pseudoephedrine (SUDAFED) 120 MG 12 hr tablet Take 120 mg by mouth daily.     thiamine (VITAMIN B-1) 100 MG tablet TAKE ONE TABLET BY MOUTH EVERY DAY 90 tablet 0   VENTOLIN HFA 108 (90 Base) MCG/ACT inhaler INHALE 2 PUFFS EVERY 4 HOURS AS NEEDED 72 g 1   folic acid (FOLVITE) 1 MG tablet TAKE ONE TABLET BY MOUTH EVERY DAY 30 tablet 1   hydrOXYzine (ATARAX/VISTARIL) 10 MG tablet TAKE ONE TABLET (10MG TOTAL) BY MOUTH THREE TIMES DAILY AS NEEDED FOR UP TO 5 DAYS. 30 tablet 0   metoprolol tartrate (LOPRESSOR) 25 MG tablet Take 1 tablet (25 mg total) by mouth daily. 30 tablet 0   mirtazapine  (REMERON) 15 MG tablet Take 1 tablet (15 mg total) by mouth at bedtime. 30 tablet 0   mometasone (NASONEX) 50 MCG/ACT nasal spray Place 2 sprays into the nose daily. 1 each 2   No facility-administered medications prior to visit.    No Known  Allergies  ROS Review of Systems  Constitutional: Negative.   Respiratory: Negative.    Cardiovascular: Negative.   Skin: Negative.   Neurological: Negative.   Psychiatric/Behavioral: Negative.       Objective:    Physical Exam HENT:     Head: Normocephalic and atraumatic.  Eyes:     Extraocular Movements: Extraocular movements intact.     Conjunctiva/sclera: Conjunctivae normal.     Pupils: Pupils are equal, round, and reactive to light.  Cardiovascular:     Rate and Rhythm: Normal rate and regular rhythm.     Pulses: Normal pulses.     Heart sounds: Normal heart sounds.  Pulmonary:     Effort: Pulmonary effort is normal.     Breath sounds: Normal breath sounds.  Neurological:     General: No focal deficit present.     Mental Status: He is alert and oriented to person, place, and time. Mental status is at baseline.  Psychiatric:        Mood and Affect: Mood normal.        Behavior: Behavior normal.        Thought Content: Thought content normal.        Judgment: Judgment normal.    BP (!) 142/88 (BP Location: Left Arm, Patient Position: Sitting, Cuff Size: Large)   Pulse 87   Temp 97.9 F (36.6 C)   Resp 16   Ht 6' (1.829 m)   Wt 193 lb 8 oz (87.8 kg)   SpO2 98%   BMI 26.24 kg/m  Wt Readings from Last 3 Encounters:  05/21/21 193 lb 8 oz (87.8 kg)  05/16/21 190 lb (86.2 kg)  05/03/21 192 lb (87.1 kg)     Health Maintenance Due  Topic Date Due   COVID-19 Vaccine (1) Never done   Pneumococcal Vaccine 54-39 Years old (1 - PCV) Never done   Hepatitis C Screening  Never done   Zoster Vaccines- Shingrix (1 of 2) Never done    There are no preventive care reminders to display for this patient.  Lab Results   Component Value Date   TSH 0.052 (L) 11/29/2020   Lab Results  Component Value Date   WBC 6.7 05/03/2021   HGB 12.3 (L) 05/03/2021   HCT 37.4 (L) 05/03/2021   MCV 81.7 05/03/2021   PLT 179 05/03/2021   Lab Results  Component Value Date   NA 143 05/03/2021   K 3.8 05/03/2021   CO2 24 05/03/2021   GLUCOSE 112 (H) 05/03/2021   BUN 17 05/03/2021   CREATININE 1.00 05/03/2021   BILITOT 0.4 05/03/2021   ALKPHOS 61 05/03/2021   AST 11 (L) 05/03/2021   ALT 15 05/03/2021   PROT 7.4 05/03/2021   ALBUMIN 4.3 05/03/2021   CALCIUM 8.8 (L) 05/03/2021   ANIONGAP 7 05/03/2021   Lab Results  Component Value Date   CHOL 155 05/22/2020   Lab Results  Component Value Date   HDL 65 05/22/2020   Lab Results  Component Value Date   LDLCALC 73 05/22/2020   Lab Results  Component Value Date   TRIG 92 05/22/2020   Lab Results  Component Value Date   CHOLHDL 2.4 05/22/2020   Lab Results  Component Value Date   HGBA1C 5.2 11/29/2020      Assessment & Plan:    1. Alcohol abuse -He will continue on current medication and advised on alcohol abstinence. - folic acid (FOLVITE) 1 MG tablet; TAKE ONE TABLET BY MOUTH EVERY  DAY  Dispense: 30 tablet; Refill: 1  2. Essential hypertension -His blood pressure is not under control, he will continue on current medication, DASH diet and exercise as tolerated. - metoprolol tartrate (LOPRESSOR) 25 MG tablet; Take 1 tablet (25 mg total) by mouth once daily.  Dispense: 30 tablet; Refill: 3  3. Generalized anxiety disorder - He will continue on current medication, will follow up with [facility]. He was advised to call Crisis help line with worsening symptoms. - hydrOXYzine (ATARAX/VISTARIL) 10 MG tablet; TAKE ONE TABLET (10MG TOTAL) BY MOUTH THREE TIMES DAILY AS NEEDED FOR UP TO 5 DAYS.  Dispense: 30 tablet; Refill: 0 - mirtazapine (REMERON) 15 MG tablet; Take 1 tablet (15 mg total) by mouth at bedtime.  Dispense: 30 tablet; Refill:  0    Follow-up: Return in about 29 days (around 06/19/2021), or if symptoms worsen or fail to improve.    Solace Wendorff Jerold Coombe, NP

## 2021-05-26 ENCOUNTER — Other Ambulatory Visit: Payer: Self-pay | Admitting: Gerontology

## 2021-05-26 ENCOUNTER — Other Ambulatory Visit: Payer: Self-pay

## 2021-05-27 ENCOUNTER — Telehealth: Payer: Self-pay | Admitting: Licensed Clinical Social Worker

## 2021-05-27 ENCOUNTER — Other Ambulatory Visit: Payer: Self-pay

## 2021-05-27 MED FILL — Albuterol Sulfate Inhal Aero 108 MCG/ACT (90MCG Base Equiv): RESPIRATORY_TRACT | 50 days supply | Qty: 25.5 | Fill #0 | Status: CN

## 2021-05-27 NOTE — Telephone Encounter (Signed)
Canceled appt and patient is aware that he must go to [facility] for treatment. - RM

## 2021-05-28 ENCOUNTER — Institutional Professional Consult (permissible substitution): Payer: Medicaid Other | Admitting: Licensed Clinical Social Worker

## 2021-06-10 ENCOUNTER — Encounter: Payer: Self-pay | Admitting: Emergency Medicine

## 2021-06-10 ENCOUNTER — Other Ambulatory Visit: Payer: Self-pay

## 2021-06-10 ENCOUNTER — Emergency Department: Payer: Medicaid Other

## 2021-06-10 ENCOUNTER — Emergency Department
Admission: EM | Admit: 2021-06-10 | Discharge: 2021-06-11 | Disposition: A | Discharge status: Home / Self Care | Payer: Medicaid Other | Attending: Emergency Medicine | Admitting: Emergency Medicine

## 2021-06-10 DIAGNOSIS — J45909 Unspecified asthma, uncomplicated: Secondary | ICD-10-CM | POA: Insufficient documentation

## 2021-06-10 DIAGNOSIS — Z7951 Long term (current) use of inhaled steroids: Secondary | ICD-10-CM | POA: Insufficient documentation

## 2021-06-10 DIAGNOSIS — W1830XA Fall on same level, unspecified, initial encounter: Secondary | ICD-10-CM | POA: Insufficient documentation

## 2021-06-10 DIAGNOSIS — Z87891 Personal history of nicotine dependence: Secondary | ICD-10-CM | POA: Insufficient documentation

## 2021-06-10 DIAGNOSIS — F10129 Alcohol abuse with intoxication, unspecified: Secondary | ICD-10-CM | POA: Insufficient documentation

## 2021-06-10 DIAGNOSIS — Z79899 Other long term (current) drug therapy: Secondary | ICD-10-CM | POA: Insufficient documentation

## 2021-06-10 DIAGNOSIS — F10929 Alcohol use, unspecified with intoxication, unspecified: Secondary | ICD-10-CM

## 2021-06-10 DIAGNOSIS — Y92009 Unspecified place in unspecified non-institutional (private) residence as the place of occurrence of the external cause: Secondary | ICD-10-CM | POA: Insufficient documentation

## 2021-06-10 DIAGNOSIS — S2231XA Fracture of one rib, right side, initial encounter for closed fracture: Secondary | ICD-10-CM | POA: Insufficient documentation

## 2021-06-10 DIAGNOSIS — I10 Essential (primary) hypertension: Secondary | ICD-10-CM | POA: Insufficient documentation

## 2021-06-10 NOTE — ED Triage Notes (Signed)
Pt to ED via EMS from home. EMS initially called out for a fall, once on scene pt c/o SOB.  Pt states fall was mechanical when walking around bed, states bruise on right knee came from fall and having some right rib pain after fall, denies hitting head or LOC.  States has drank approx liter wine in last 3 hours, pt A&Ox4, chest rise even and unlabored, in NAD at this time.  EMS vitals 162/94, 96% RA, 94 HR, 18 RR and lungs clear throughout.

## 2021-06-10 NOTE — ED Provider Notes (Signed)
[facility] Emergency Department Provider Note  ____________________________________________   Event Date/Time   First MD Initiated Contact with Patient 06/10/21 2301     (approximate)  I have reviewed the triage vital signs and the nursing notes.   HISTORY  Chief Complaint Fall  Level 5 caveat:  history/ROS limited by acute intoxication  HPI Miguel [name] is a 60 y.o. male well-known to the emergency department for frequent visits related to alcohol abuse.  He presents by EMS tonight after having a fall at home.  After the initial call he complained of some shortness of breath but no longer says it is hard for him to breathe.  He is now complaining of pain in the right side of his ribs.  He said that has been present for the last 2 days because he fell the day before as well.  He said he has had about a liter of wine in the last 3 hours.  This is not abnormal for him.  He is denying other chest pain.  He denies fever/chills, abdominal pain, nausea, vomiting.  Nothing in particular made his symptoms better or worse and is always acute but his alcohol abuse is chronic.     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    Right ankle pain 06/26/2020   Family history of breast cancer    Hx of colonic polyps    Hospital discharge follow-up 06/05/2020   Kidney function test abnormal 06/05/2020   Abdominal pain 04/12/2020   Hypertensive urgency 04/12/2020   Alcohol withdrawal ([place]) 03/04/2020    Right-sided chest wall pain 02/28/2020   Alcohol abuse with alcohol-induced mood disorder ([place]) 12/02/2019   Moderate episode of recurrent major depressive disorder ([place])    Health care maintenance 10/26/2019   Right knee pain 10/26/2019   Generalized anxiety disorder 10/14/2019   MDD (major depressive disorder), recurrent severe, 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/27/2019   Social anxiety disorder 07/27/2019   Left-sided chest wall pain 54/62/7035   Alcoholic cirrhosis of liver without ascites ([place]) 06/14/2019   Alcohol abuse 06/14/2019   AKI (acute kidney injury) ([place]) 05/27/2019   Alcohol withdrawal syndrome without complication ([place]) 00/93/8182   Alcoholic intoxication without complication ([place])    Sepsis ([place]) 03/08/2019   Breast lump or mass 10/04/2018   Sepsis secondary to UTI ([place]) 09/14/2018   Asthma 07/07/2018   GERD (gastroesophageal reflux disease) 07/07/2018   OCD (obsessive compulsive disorder) 99/37/1696   Self-inflicted laceration of left wrist ([place]) 03/10/2018   Leg hematoma 12/25/2017   Suicide and self-inflicted injury by cutting and piercing instrument ([place]) 03/10/2017   Severe recurrent major 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 ([place]) 03/09/2017   Substance induced mood disorder ([place]) 08/15/2016   Involuntary commitment 08/15/2016   Alcohol use disorder, severe, dependence ([place]) 02/05/2016   Hypertension 12/05/2015   Tachycardia 12/05/2015   Gout 11/13/2015  Chronic back pain 05/02/2015    Past Surgical History:  Procedure Laterality Date   CHOLECYSTECTOMY  2012   COLONOSCOPY     COLONOSCOPY WITH PROPOFOL N/A 05/28/2020   Procedure: COLONOSCOPY WITH PROPOFOL;  Surgeon: [name], MD;  Location: [facility] ENDOSCOPY;  Service: Gastroenterology;  Laterality: N/A;   EXTRACORPOREAL SHOCK WAVE LITHOTRIPSY Left 01/12/2019   Procedure: EXTRACORPOREAL SHOCK WAVE LITHOTRIPSY (ESWL);  Surgeon: Billey Co, MD;  Location: [facility] ORS;  Service: Urology;  Laterality: Left;    UPPER GI ENDOSCOPY      Prior to Admission medications   Medication Sig Start Date End Date Taking? Authorizing Provider  ADVAIR DISKUS 100-50 MCG/ACT AEPB INHALE 1 PUFF 2 TIMES A DAY. RINSE MOUTH AND SPIT AFTER EACH USE. 06/12/20 06/19/21  Tawni Millers, MD  allopurinol (ZYLOPRIM) 300 MG tablet TAKE ONE TABLET BY MOUTH EVERY DAY 04/15/21 11/01/21  Iloabachie, Chioma E, NP  ascorbic acid (VITAMIN C) 500 MG tablet Take by mouth. 08/01/19   [provider]  fluticasone (FLONASE) 50 MCG/ACT nasal spray SPRAY ONE SPRAY INTO BOTH NOSTRILS ONCE DAILY. 05/16/21 05/16/22  Lannie Fields, PA-C  folic acid (FOLVITE) 1 MG tablet TAKE ONE TABLET BY MOUTH EVERY DAY 05/21/21 05/21/22  Iloabachie, Chioma E, NP  lisinopril (ZESTRIL) 40 MG tablet TAKE ONE TABLET BY MOUTH EVERY DAY 04/15/21 04/15/22  Iloabachie, Chioma E, NP  loratadine (CLARITIN) 10 MG tablet Take 10 mg by mouth daily.    [provider]  metoprolol tartrate (LOPRESSOR) 25 MG tablet Take 1 tablet (25 mg total) by mouth once daily. 05/21/21 06/25/21  Iloabachie, Chioma E, NP  mirtazapine (REMERON) 15 MG tablet Take 1 tablet (15 mg total) by mouth at bedtime. 05/21/21 06/25/21  Iloabachie, Chioma E, NP  pantoprazole (PROTONIX) 40 MG tablet TAKE 1 TABLET (40 MG TOTAL) BY MOUTH DAILY. 10/15/20 10/15/21  Iloabachie, Chioma E, NP  pseudoephedrine (SUDAFED) 120 MG 12 hr tablet Take 120 mg by mouth daily.    [provider]  thiamine (VITAMIN B-1) 100 MG tablet TAKE ONE TABLET BY MOUTH EVERY DAY 10/22/20 10/22/21  Iloabachie, Chioma E, NP  albuterol (VENTOLIN HFA) 108 (90 Base) MCG/ACT inhaler Inhale 2 puffs into the lungs every 4 (four) hours as needed. 05/27/21 05/27/22  Iloabachie, Chioma E, NP    Allergies Patient has no known allergies.  Family History  Problem Relation Age of Onset   Alcohol abuse Father    Breast cancer Mother 75   Anxiety disorder Brother    Other Maternal Grandmother        unknown medical history   Other Maternal  Grandfather        unknow medical history   Other Paternal Grandmother        unknown medical history   Other Paternal Grandfather        unknown medical history   Healthy Brother     Social History Social History   Tobacco Use   Smoking status: Former    Types: Cigarettes    Quit date: 03/19/1981    Years since quitting: 40.2   Smokeless tobacco: Never  Vaping Use   Vaping Use: Never used  Substance Use Topics   Alcohol use: Not Currently    Alcohol/week: 12.0 standard drinks    Types: 12 Shots of liquor per week    Comment: last use 05/03/21 (none since ED visit)   Drug use: No    Review of Systems Level 5 caveat:  history/ROS limited by acute  intoxication   constitutional: No fever/chills Eyes: No visual changes. ENT: No sore throat. Cardiovascular: Denies chest pain. Respiratory: Positive shortness of breath. Gastrointestinal: No abdominal pain.  No nausea, no vomiting.  No diarrhea.  No constipation. Genitourinary: Negative for dysuria. Musculoskeletal: Right sided rib pain. Integumentary: Negative for rash. Neurological: Negative for headaches, focal weakness or numbness.   ____________________________________________   PHYSICAL EXAM:  VITAL SIGNS: ED Triage Vitals  Enc Vitals Group     BP 06/10/21 2246 (!) 146/93     Pulse Rate 06/10/21 2246 91     Resp 06/10/21 2246 20     Temp 06/10/21 2246 98 F (36.7 C)     Temp Source 06/10/21 2246 Oral     SpO2 06/10/21 2246 96 %     Weight 06/10/21 2247 86.2 kg (190 lb)     Height 06/10/21 2247 1.829 m (6')     Head Circumference --      Peak Flow --      Pain Score 06/10/21 2247 5     Pain Loc --      Pain Edu? --      Excl. in [place]? --     Constitutional: Alert and oriented but appears intoxicated. Eyes: Conjunctivae are normal.  Head: Atraumatic. Nose: No congestion/rhinnorhea. Mouth/Throat: Patient is wearing a mask. Neck: No stridor.  No meningeal signs.   Cardiovascular: Normal rate, regular  rhythm. Good peripheral circulation. Respiratory: Normal respiratory effort.  No retractions. Gastrointestinal: Soft and nontender. No distention.  Musculoskeletal: No gross deformities.  He is reporting severe tenderness palpation all throughout the right side of his rib cage to even light touch but without any palpable deformities. Neurologic:  Normal speech and language. No gross focal neurologic deficits are appreciated.  Skin:  Skin is warm, dry and intact.  Patient has some subacute bruising on his extremities but no bruising to the right side of his rib cage where he is reporting tenderness.  He has an older healing contusion laceration below the ribs but not at the area that he is reporting pain. Psychiatric: Mood and affect are normal. Speech and behavior are normal.  ____________________________________________   LABS (all labs ordered are listed, but only abnormal results are displayed)  Labs Reviewed - No data to display ____________________________________________   RADIOLOGY I, Hinda Kehr, personally viewed and evaluated these images (plain radiographs) as part of my medical decision making, as well as reviewing the written report by the radiologist.  ED MD interpretation:  Acute 10th right rib fracture  Official radiology report(s): DG Ribs Unilateral W/Chest Right  Result Date: 06/11/2021 CLINICAL DATA:  Status post fall. EXAM: RIGHT RIBS AND CHEST - 3+ VIEW COMPARISON:  March 05, 2021 FINDINGS: A radiopaque marker was placed at the site of the patient's pain. Acute fracture is seen along the anterior tip of the tenth right rib. There is no evidence of pneumothorax or pleural effusion. Mild atelectasis is seen within the left lung base. Heart size and mediastinal contours are within normal limits. IMPRESSION: Acute tenth right rib fracture. Electronically Signed   By: Virgina Norfolk M.D.   On: 06/11/2021 01:18     ____________________________________________   PROCEDURES   Procedure(s) performed (including Critical Care):  Procedures   ____________________________________________   INITIAL IMPRESSION / MDM / [place] / ED COURSE  As part of my medical decision making, I reviewed the following data within the [place] notes reviewed and incorporated, Old chart reviewed, Radiograph  reviewed , and Notes from prior ED visits   Differential diagnosis includes, but is not limited to, chronic alcohol abuse, electrolyte or metabolic abnormality, fall, contusion, rib fracture, pneumothorax.  Patient's vital signs are stable and within normal limits.  He appears intoxicated and appears to be at his baseline based on my prior experience with the patient.  He is in no distress.  He is, however, somewhat disgruntled and irritable which is not always typical for him.  I redirected him and will obtain x-rays of his chest and right-sided ribs to see if he has any sign of fracture.  He needs time to metabolize.  He is reluctantly agreeable if the plan.     Clinical Course as of 06/11/21 0505  Wed Jun 11, 2021  0100 Patient requesting Ativan.  Although his vital signs are stable, he is a chronic alcoholic and it is reasonable to give him 1 mg right now while he is being observed and while we are awaiting the results of his imaging.  Ativan 1 mg p.o. ordered. [CF]  0128 DG Ribs Unilateral W/Chest Right I personally reviewed the patient's imaging and agree with the radiologist's interpretation that there is an acute rib fracture.  This is consistent with his tenderness to palpation.  The patient just got some Ativan so we will hold off on any additional pain medicine at this time. [CF]  [phone] The patient is clinically sober, ambulating without difficulty, and ready for discharge.  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  I gave my usual and customary management recommendations and return  precautions.  His pain is currently well controlled.  I told him about the rib fracture and provided him with an incentive spirometer and explained to him why it is important that he use it. [CF]    Clinical Course User Index [CF] Hinda Kehr, MD     ____________________________________________  FINAL CLINICAL IMPRESSION(S) / ED DIAGNOSES  Final diagnoses:  Closed fracture of one rib of right side, initial encounter  Alcoholic intoxication with complication ([place])     MEDICATIONS GIVEN DURING THIS VISIT:  Medications  LORazepam (ATIVAN) tablet 1 mg (1 mg Oral Given 06/11/21 0114)     ED Discharge Orders     None        Note:  This document was prepared using Dragon voice recognition software and may include unintentional dictation errors.   Hinda Kehr, MD 06/11/21 [phone]

## 2021-06-11 MED ORDER — LORAZEPAM 1 MG PO TABS
1.0000 mg | ORAL_TABLET | Freq: Once | ORAL | Status: AC
  Administered 2021-06-11: 1 mg via ORAL
  Filled 2021-06-11: qty 1

## 2021-06-11 NOTE — ED Notes (Signed)
Took patient care, alert and oriented wants something for nerves. Md aware

## 2021-06-11 NOTE — Discharge Instructions (Signed)
Your workup today showed that you have a fracture to one to one of your ribs.  Unfortunately this type of injury hurts but there is no way to fix it immediately; it must heal over time.  Be sure to take plenty of deep breaths so that you get rid of the "bad air" in your lungs.  If you are given a device called an incentive spirometer, please use it as recommended.  Unless you have been told by your doctor not to do so, we recommend you take ibuprofen 600 mg 3 times daily with meals for no more than 5 days.  You can also take Tylenol 1000 mg every 6 hours for pain.  Follow-up at the clinics or with the doctors described in this paperwork.  Return to the emergency department if he develop new or worsening symptoms that concern you.

## 2021-06-11 NOTE — ED Notes (Signed)
Pt alert and oriented x4, verbalized discharge instructions, is getting an uber per wife

## 2021-06-11 NOTE — ED Notes (Signed)
Wanting to know what is going on

## 2021-06-14 IMAGING — CT CT CERVICAL SPINE W/O CM
3 of 4 series · 9 of 33 positions shown, 10 images · non-contrast
Comparison: None.

CLINICAL DATA: Status post fall.

EXAM:
CT CERVICAL SPINE WITHOUT CONTRAST
TECHNIQUE: Multidetector CT imaging of the cervical spine was performed without
intravenous contrast. Multiplanar CT image reconstructions were also
generated.

[Series 6: sagittal bone · sagittal · 0.22mm/px · 5 of 61 slices shown]
[im 21/61  bone]
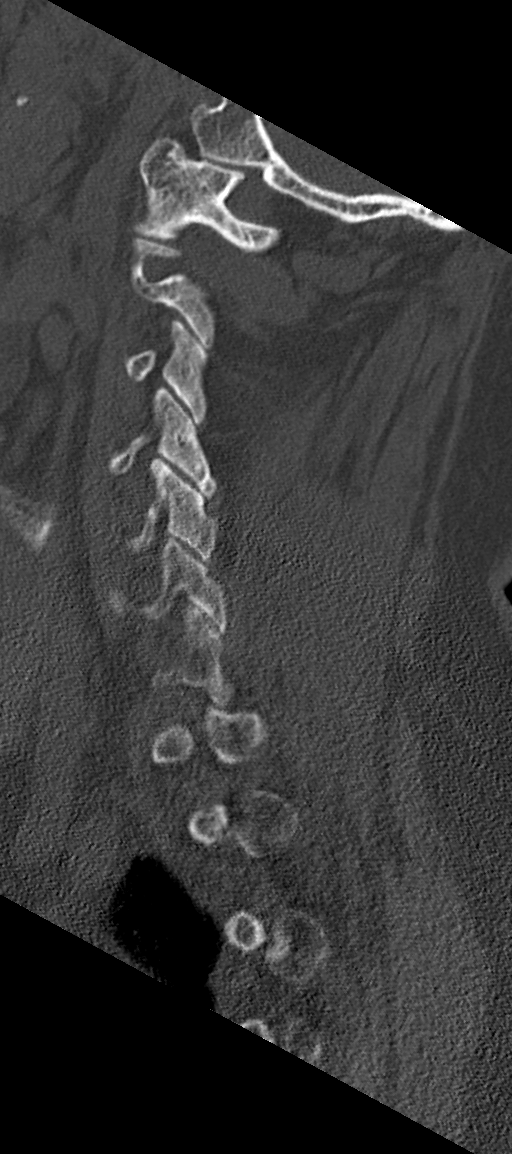
[im 26/61  bone]
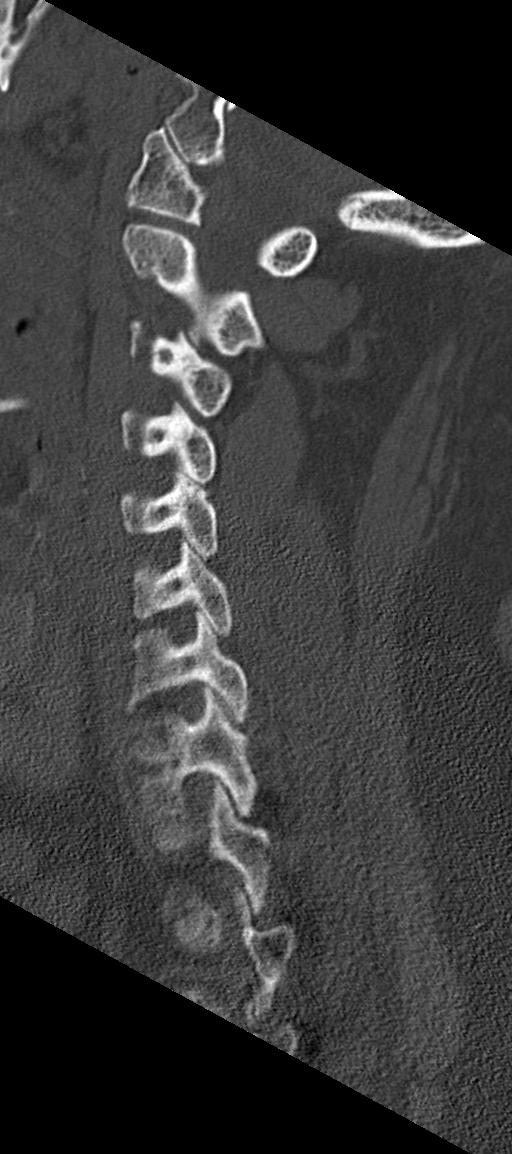
[im 31/61  bone]
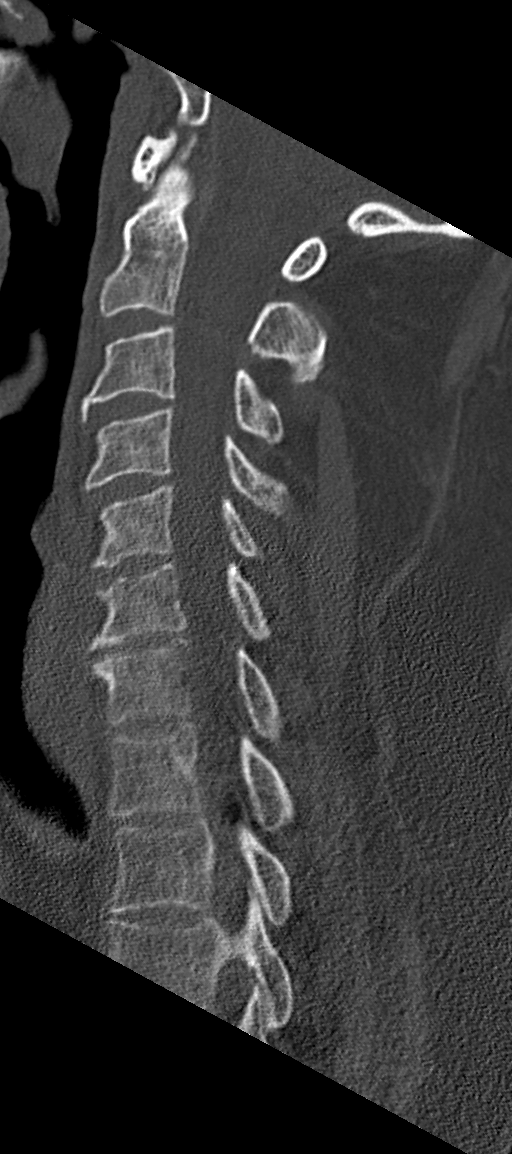
[im 36/61  bone]
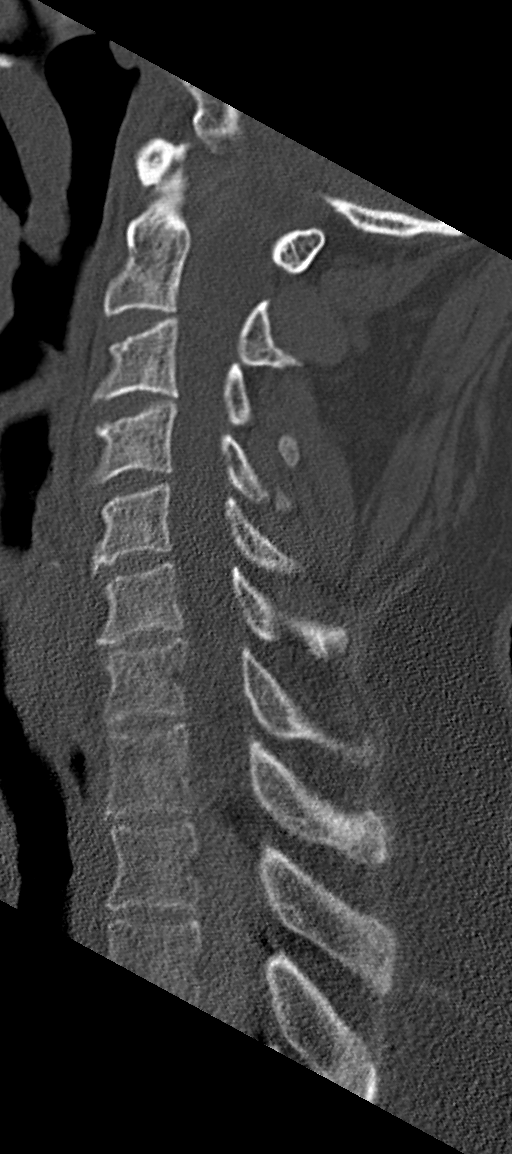
[im 41/61  bone]
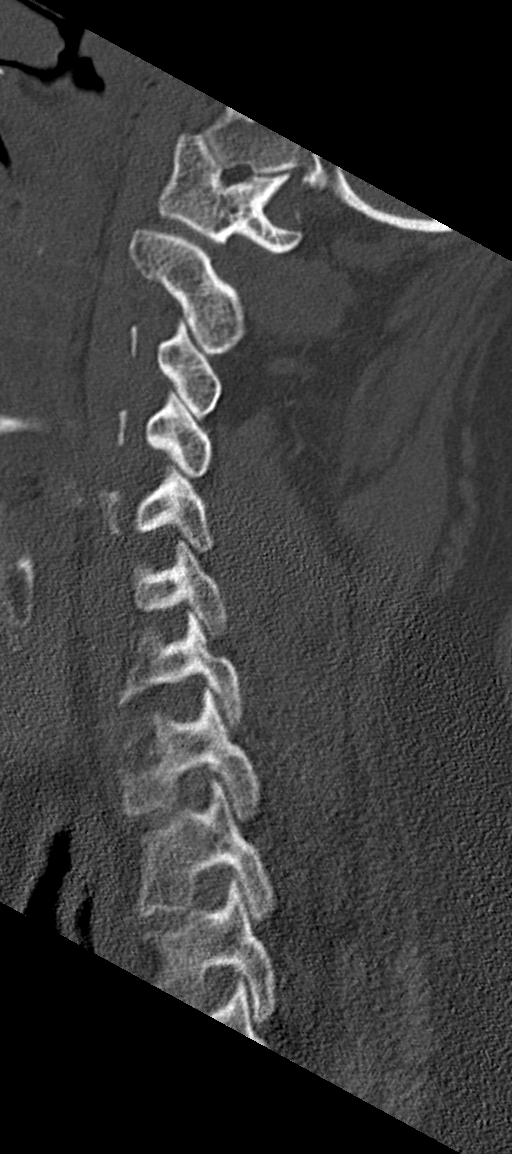

[Series 7: coronal bone · coronal · 0.23mm/px · 3 of 58 slices shown]
[im 14/58  bone]
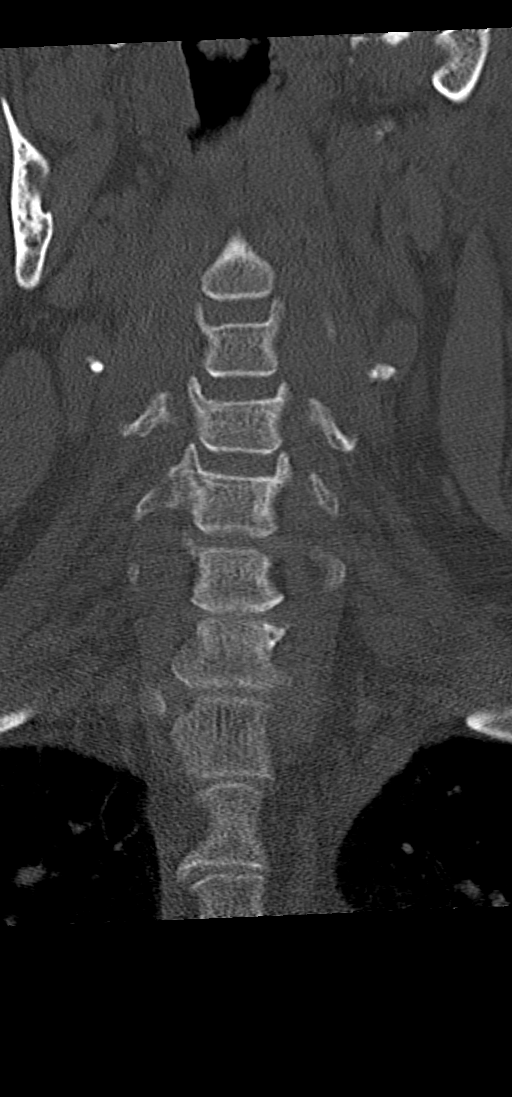
[im 24/58  bone]
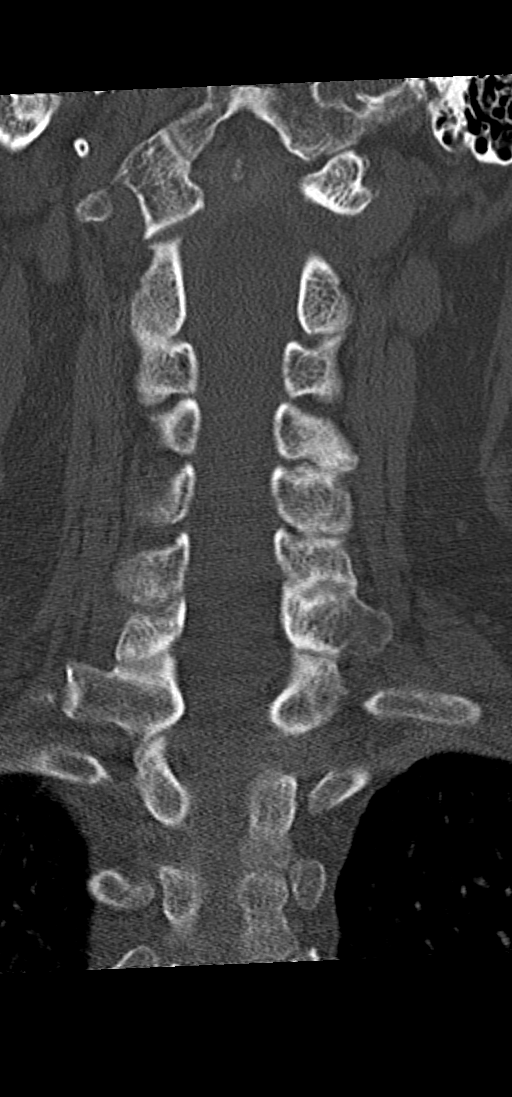
[im 34/58  bone]
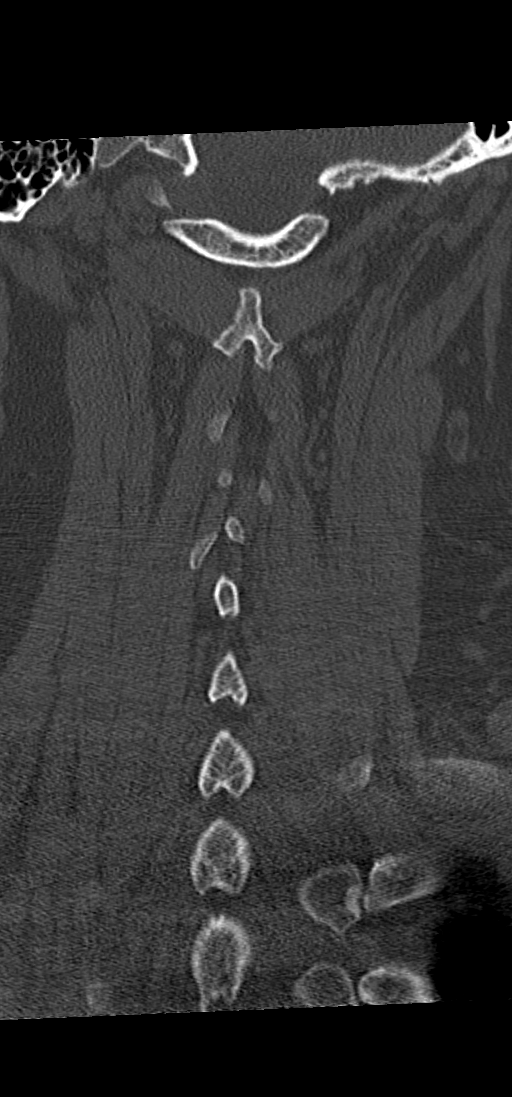

[Series 8: orthogonal bone · axial · 0.22mm/px · z∈[-30,-30]mm · 1 of 129 slices shown, 2 images]
[im 74/129  soft-tissue]
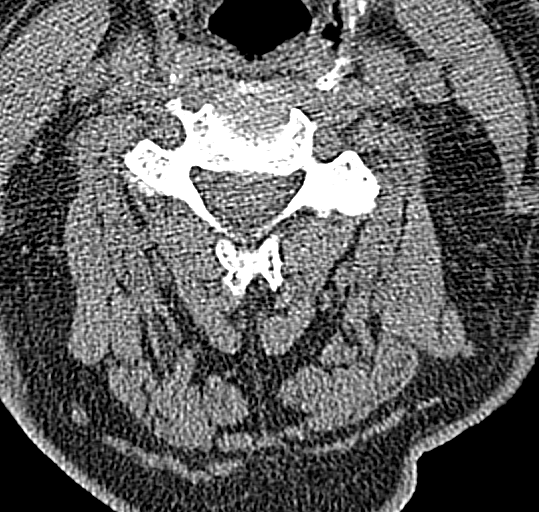
[im 74/129  bone]
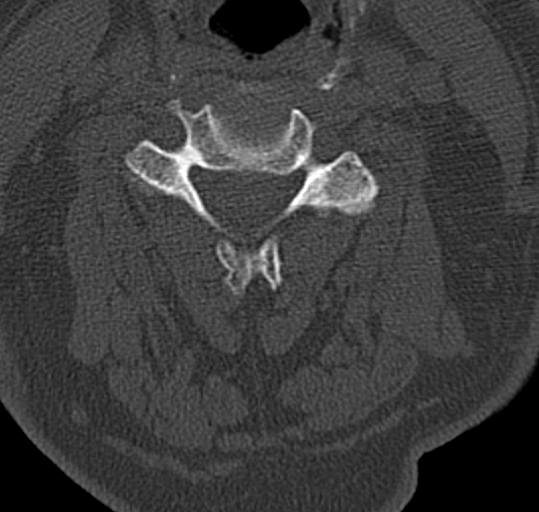

[9 of 33 positions shown; findings below may reference images not displayed]

FINDINGS: Alignment: Normal.

Skull base and vertebrae: No acute fracture. No primary bone lesion
or focal pathologic process.

Soft tissues and spinal canal: No prevertebral fluid or swelling. No
visible canal hematoma.

Disc levels: Mild endplate sclerosis and anterior osteophyte
formation is seen at the levels of C3-C4, C5-C6 and C6-C7.

Mild to moderate severity intervertebral disc space narrowing is
seen at the levels of C6-C7 and C7-T1.

Mild bilateral multilevel facet joint hypertrophy is noted.

Upper chest: Negative.

Other: None.
IMPRESSION: 1. Mild-to-moderate severity degenerative changes, most prominent at
the levels of C6-C7 and C7-T1.
2. No evidence of an acute fracture or subluxation.

## 2021-06-14 IMAGING — CT CT HEAD W/O CM
3 series · 16 of 47 positions shown, 19 images · non-contrast
Comparison: August 14, 2020

CLINICAL DATA: Status post fall.

EXAM:
CT HEAD WITHOUT CONTRAST
TECHNIQUE: Contiguous axial images were obtained from the base of the skull
through the vertex without intravenous contrast.

[Series 3: head wo · axial · 0.44mm/px · z∈[+81,+211]mm · 10 of 32 slices shown, 13 images]
[im 3/32  brain]
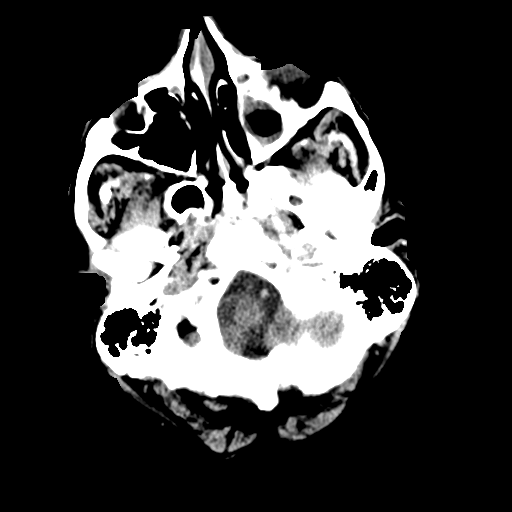
[im 3/32  bone]
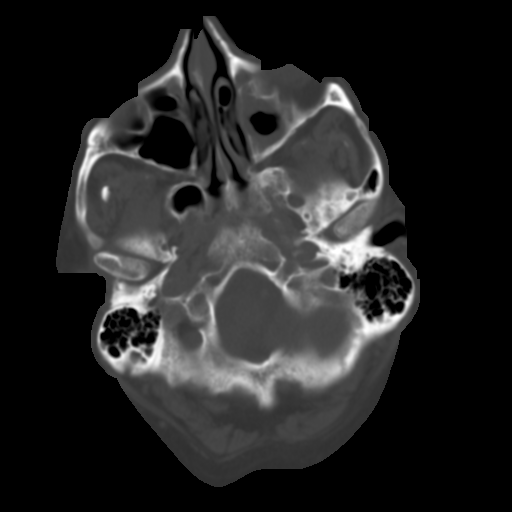
[im 6/32  brain]
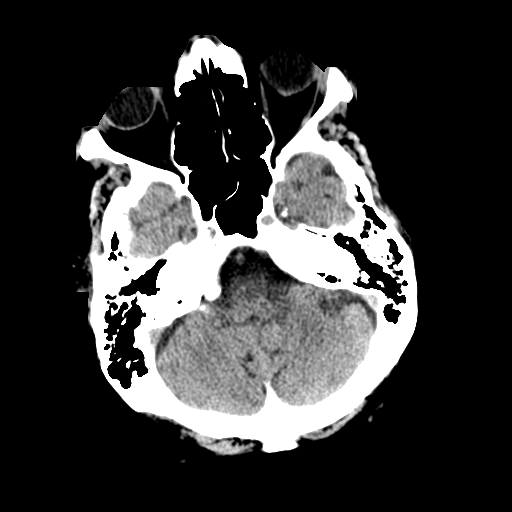
[im 9/32  brain]
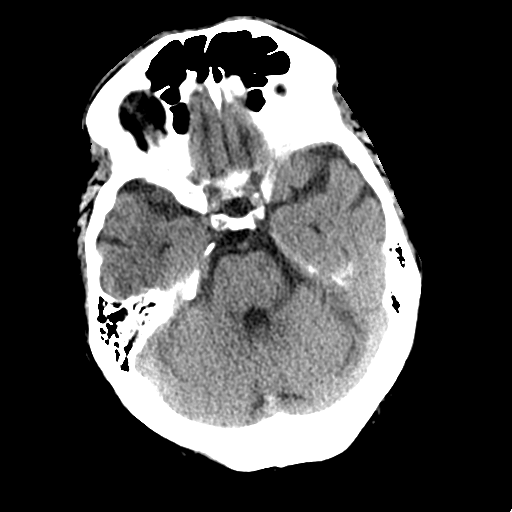
[im 11/32  brain]
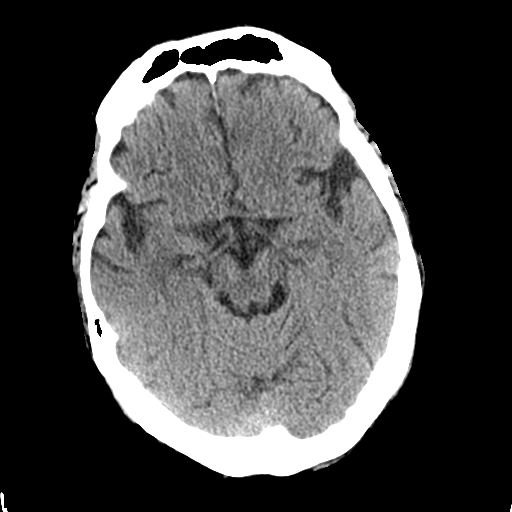
[im 14/32  brain]
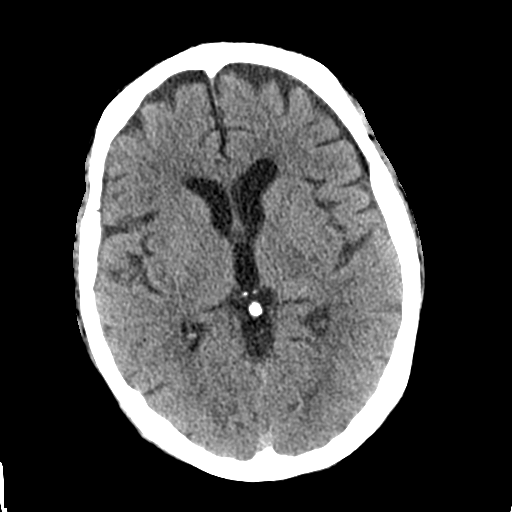
[im 14/32  bone]
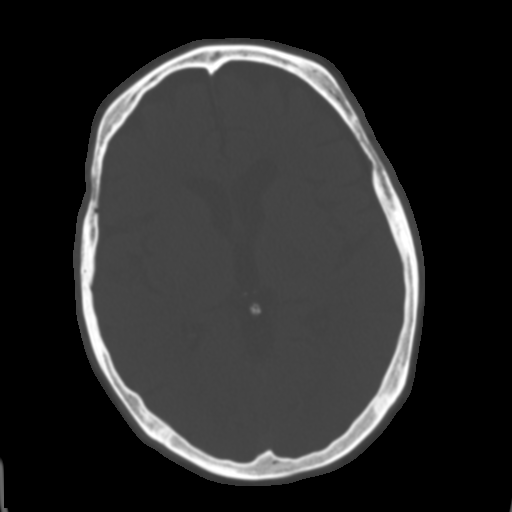
[im 18/32  brain]
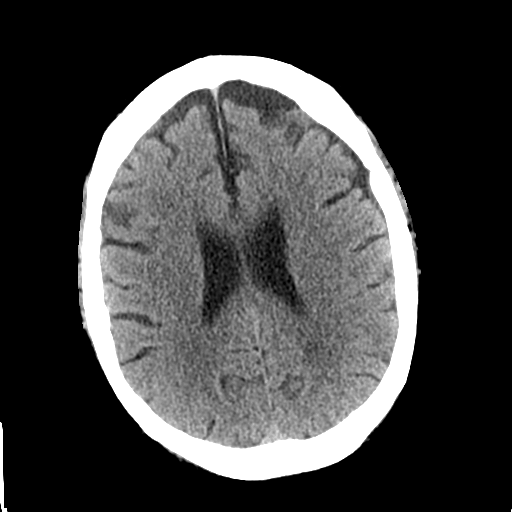
[im 21/32  brain]
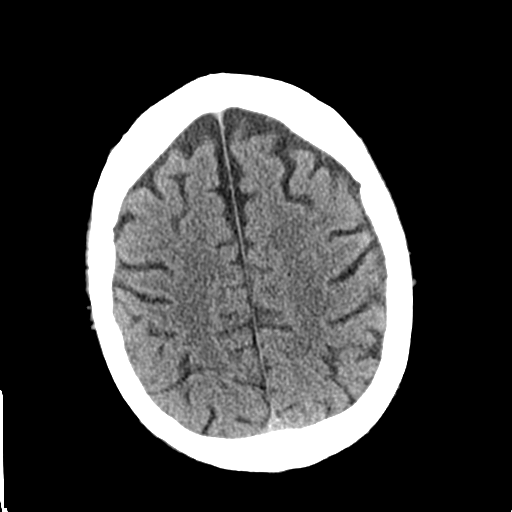
[im 24/32  brain]
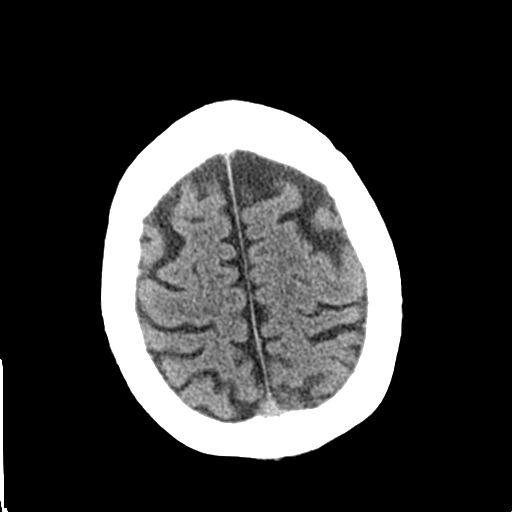
[im 26/32  brain]
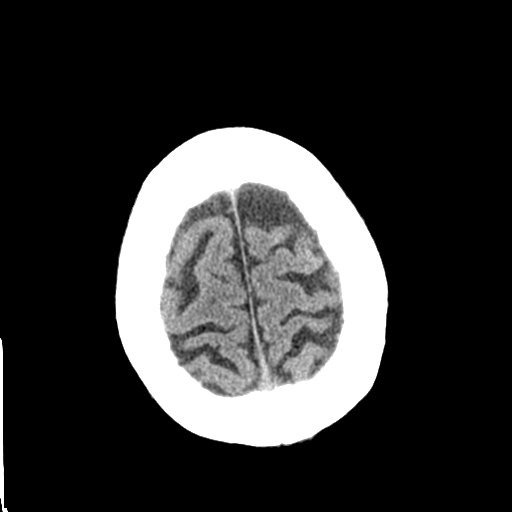
[im 26/32  bone]
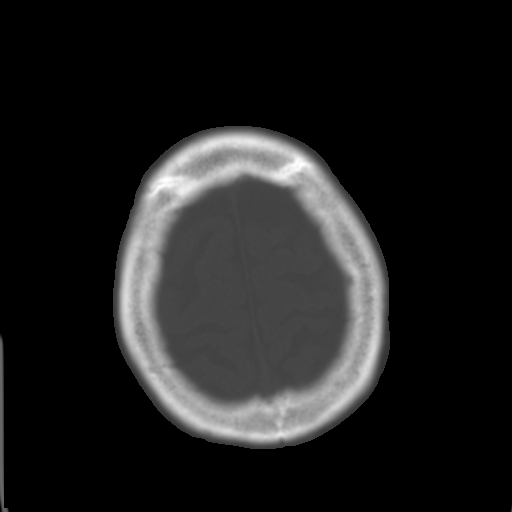
[im 29/32  brain]
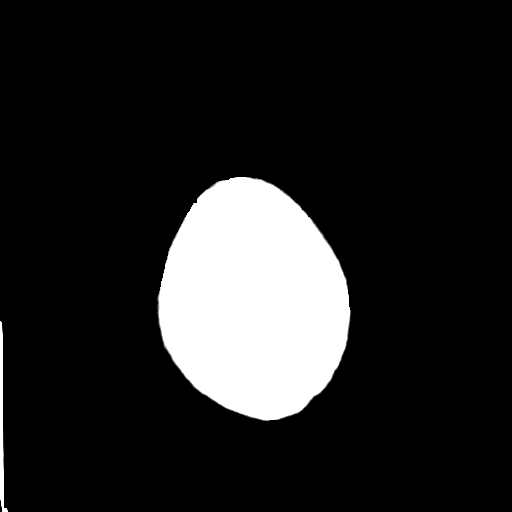

[Series 4: coronal soft tissue · coronal · 0.31mm/px · 3 of 65 slices shown]
[im 25/65  brain]
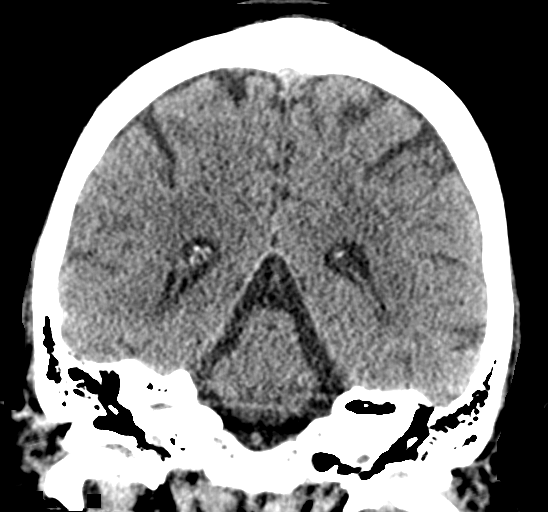
[im 30/65  brain]
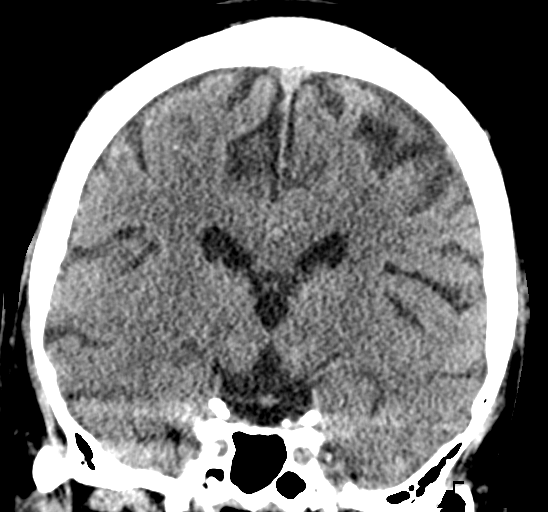
[im 35/65  brain]
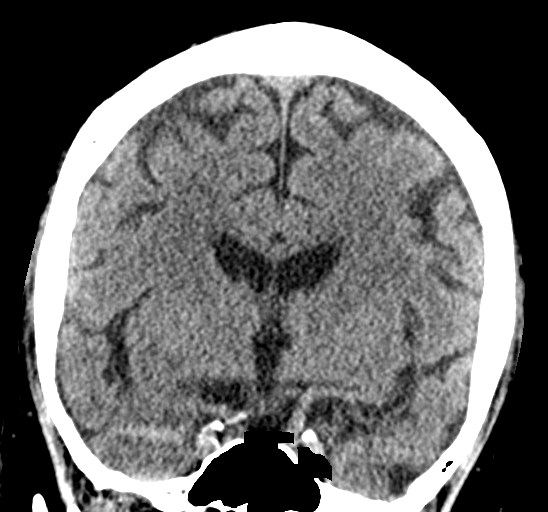

[Series 5: sagittal soft tissue · sagittal · 0.31mm/px · 3 of 57 slices shown]
[im 19/57  brain]
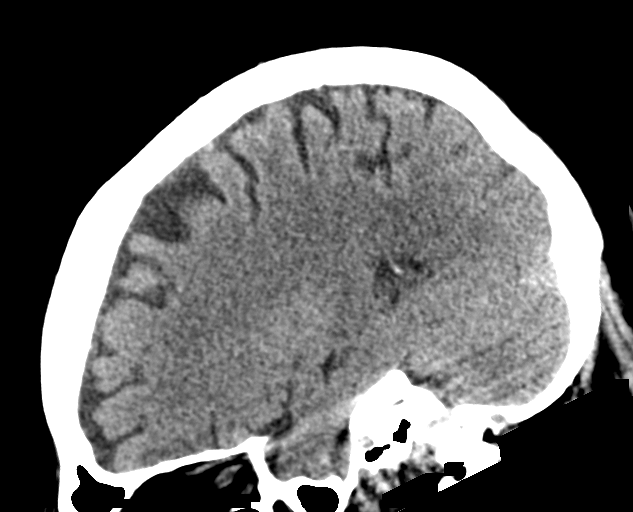
[im 29/57  brain]
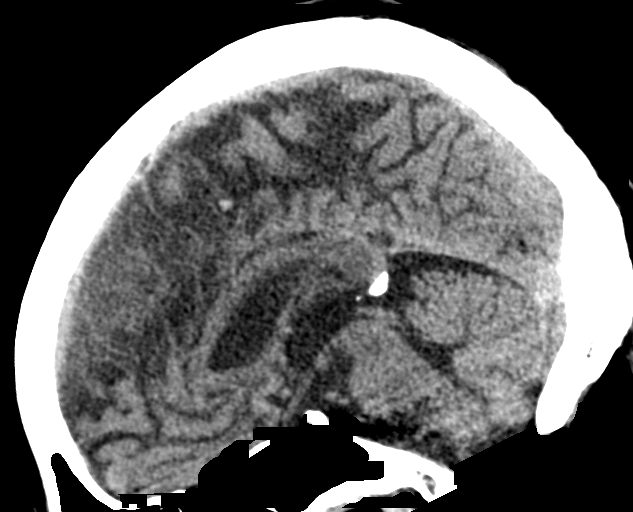
[im 38/57  brain]
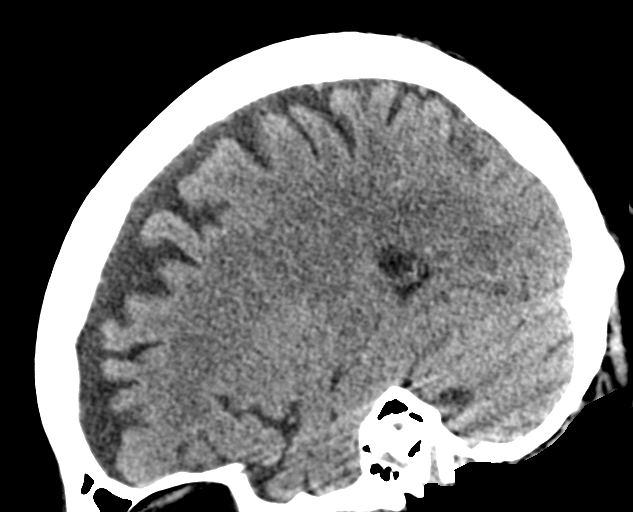

[16 of 47 positions shown; findings below may reference images not displayed]

FINDINGS: Brain: There is mild cerebral atrophy with widening of the
extra-axial spaces and ventricular dilatation.
There are areas of decreased attenuation within the white matter
tracts of the supratentorial brain, consistent with microvascular
disease changes.

Vascular: No hyperdense vessel or unexpected calcification.

Skull: Normal. Negative for fracture or focal lesion.

Sinuses/Orbits: There is marked severity left maxillary sinus
mucosal thickening.

Other: None.
IMPRESSION: 1. Mild cerebral atrophy.
2. No acute intracranial abnormality.
3. Marked severity left maxillary sinus disease.

## 2021-06-17 ENCOUNTER — Emergency Department: Payer: Medicaid Other

## 2021-06-17 ENCOUNTER — Other Ambulatory Visit: Payer: Self-pay

## 2021-06-17 ENCOUNTER — Emergency Department
Admission: EM | Admit: 2021-06-17 | Discharge: 2021-06-18 | Disposition: A | Discharge status: Home / Self Care | Payer: Medicaid Other | Attending: Emergency Medicine | Admitting: Emergency Medicine

## 2021-06-17 DIAGNOSIS — Z87891 Personal history of nicotine dependence: Secondary | ICD-10-CM | POA: Insufficient documentation

## 2021-06-17 DIAGNOSIS — I10 Essential (primary) hypertension: Secondary | ICD-10-CM | POA: Insufficient documentation

## 2021-06-17 DIAGNOSIS — F10920 Alcohol use, unspecified with intoxication, uncomplicated: Secondary | ICD-10-CM

## 2021-06-17 DIAGNOSIS — F10129 Alcohol abuse with intoxication, unspecified: Secondary | ICD-10-CM | POA: Insufficient documentation

## 2021-06-17 DIAGNOSIS — Y908 Blood alcohol level of 240 mg/100 ml or more: Secondary | ICD-10-CM | POA: Insufficient documentation

## 2021-06-17 DIAGNOSIS — J45909 Unspecified asthma, uncomplicated: Secondary | ICD-10-CM | POA: Insufficient documentation

## 2021-06-17 DIAGNOSIS — Z7952 Long term (current) use of systemic steroids: Secondary | ICD-10-CM | POA: Insufficient documentation

## 2021-06-17 DIAGNOSIS — R0602 Shortness of breath: Secondary | ICD-10-CM

## 2021-06-17 DIAGNOSIS — J029 Acute pharyngitis, unspecified: Secondary | ICD-10-CM | POA: Insufficient documentation

## 2021-06-17 DIAGNOSIS — Z79899 Other long term (current) drug therapy: Secondary | ICD-10-CM | POA: Insufficient documentation

## 2021-06-17 DIAGNOSIS — Z20822 Contact with and (suspected) exposure to covid-19: Secondary | ICD-10-CM | POA: Insufficient documentation

## 2021-06-17 LAB — URINALYSIS, COMPLETE (UACMP) WITH MICROSCOPIC
Bacteria, UA: NONE SEEN
Bilirubin Urine: NEGATIVE
Glucose, UA: NEGATIVE mg/dL
Hgb urine dipstick: NEGATIVE
Ketones, ur: NEGATIVE mg/dL
Leukocytes,Ua: NEGATIVE
Nitrite: NEGATIVE
Protein, ur: NEGATIVE mg/dL
Specific Gravity, Urine: 1.008 (ref 1.005–1.030)
Squamous Epithelial / HPF: NONE SEEN (ref 0–5)
pH: 6 (ref 5.0–8.0)

## 2021-06-17 LAB — HEPATIC FUNCTION PANEL
ALT: 13 U/L (ref 0–44)
AST: 16 U/L (ref 15–41)
Albumin: 4.3 g/dL (ref 3.5–5.0)
Alkaline Phosphatase: 64 U/L (ref 38–126)
Bilirubin, Direct: <0.1 mg/dL (ref 0.0–0.2)
Total Bilirubin: 0.5 mg/dL (ref 0.3–1.2)
Total Protein: 7.5 g/dL (ref 6.5–8.1)

## 2021-06-17 LAB — BASIC METABOLIC PANEL
Anion gap: 6 (ref 5–15)
BUN: 21 mg/dL (ref 8–23)
CO2: 26 mmol/L (ref 22–32)
Calcium: 9.4 mg/dL (ref 8.9–10.3)
Chloride: 109 mmol/L (ref 98–111)
Creatinine, Ser: 1.18 mg/dL (ref 0.61–1.24)
GFR, Estimated: >60 mL/min (ref >60)
Glucose, Bld: 140 mg/dL — ABNORMAL HIGH (ref 70–99)
Potassium: 3.7 mmol/L (ref 3.5–5.1)
Sodium: 141 mmol/L (ref 135–145)

## 2021-06-17 LAB — TROPONIN I (HIGH SENSITIVITY): Troponin I (High Sensitivity): 4 ng/L (ref <18)

## 2021-06-17 LAB — ETHANOL: Alcohol, Ethyl (B): 365 mg/dL (ref <10)

## 2021-06-17 LAB — CBC
HCT: 41.3 % (ref 39.0–52.0)
Hemoglobin: 13.5 g/dL (ref 13.0–17.0)
MCH: 27.8 pg (ref 26.0–34.0)
MCHC: 32.7 g/dL (ref 30.0–36.0)
MCV: 85 fL (ref 80.0–100.0)
Platelets: 216 10*3/uL (ref 150–400)
RBC: 4.86 MIL/uL (ref 4.22–5.81)
RDW: 16.9 % — ABNORMAL HIGH (ref 11.5–15.5)
WBC: 7.8 10*3/uL (ref 4.0–10.5)
nRBC: 0 % (ref 0.0–0.2)

## 2021-06-17 MED ORDER — LORAZEPAM 1 MG PO TABS
1.0000 mg | ORAL_TABLET | Freq: Once | ORAL | Status: AC
  Administered 2021-06-17: 1 mg via ORAL
  Filled 2021-06-17: qty 1

## 2021-06-17 MED ORDER — ONDANSETRON HCL 4 MG/2ML IJ SOLN
4.0000 mg | Freq: Once | INTRAMUSCULAR | Status: AC
  Administered 2021-06-17: 4 mg via INTRAVENOUS
  Filled 2021-06-17: qty 2

## 2021-06-17 MED ORDER — SODIUM CHLORIDE 0.9 % IV BOLUS
1000.0000 mL | Freq: Once | INTRAVENOUS | Status: AC
  Administered 2021-06-17: 1000 mL via INTRAVENOUS

## 2021-06-17 NOTE — ED Notes (Signed)
Patient stating that he only had two shots of wine; he had some shortness of breath and wife kicked him out apparently and she couldn't get out of the car. Denies cp at this time;

## 2021-06-17 NOTE — ED Provider Notes (Signed)
-----------------------------------------   11:07 PM on 06/17/2021 -----------------------------------------  Assuming care from Dr. Jari Pigg.  In short, Miguel [name] is a 60 y.o. male with a chief complaint of viral symptoms and ETOH intoxication.  Refer to the original H&P for additional details.  The current plan of care is to monitor for sobriety and check lab results.    ----------------------------------------- 1:09 AM on 06/18/2021 -----------------------------------------  Patient is clinically sober, ambulatory without difficulty, very verbal and conversant with staff.  He said he is ready to go home.  His work-up has been reassuring with no evidence of acute infection or other acute abnormality other than his ethanol level of 365, and on top of that, he received a dose of Ativan prior to the ethanol level resulting because he told the previous provider that he thought he was withdrawing.  I explained to him that he certainly can go home (this is essentially his baseline I have seen this patient many times in the emergency department in the past) but he must have a sober adult pick him up or take a taxi or ride sharing service home.  He said that he drove his own car and that he should be fine.  I assured him that is not fine, that he is still technically intoxicated, and that he will not be allowed to leave in his own vehicle.  He decided that he would call a cab.  He was informed, as was the front desk staff, that he was not to leave in his own vehicle, and that if he tries to do so he will be stopped by law enforcement who has been advised of the fact that he is still technically intoxicated and should not be allowed to drive.  He says that he understands and the ED staff will visualize him getting into a taxi rather than going out to the parking lot by himself.   Hinda Kehr, MD 06/18/21 [phone]

## 2021-06-17 NOTE — ED Provider Notes (Signed)
[facility] Emergency Department Provider Note  ____________________________________________   Event Date/Time   First MD Initiated Contact with Patient 06/17/21 2126     (approximate)  I have reviewed the triage vital signs and the nursing notes.   HISTORY  Chief Complaint Sore Throat and Dizziness    HPI Miguel [name] is a 60 y.o. male with history of alcohol abuse, kidney stones who comes in with concerns for dizziness.  Patient comes in for nose, throat pain after laying down on the ground for multiple hours and working on his car in his garage.  States that he feels little nauseous.  Patient reports that he only took 2 shots today.  Denies any falls today.  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    Right ankle pain 06/26/2020   Family history of breast cancer    Hx of colonic polyps    Hospital discharge follow-up 06/05/2020   Kidney function test abnormal 06/05/2020   Abdominal pain 04/12/2020   Hypertensive urgency 04/12/2020   Alcohol withdrawal ([place]) 03/04/2020   Right-sided chest wall pain 02/28/2020   Alcohol abuse with alcohol-induced mood disorder ([place]) 12/02/2019   Moderate episode of recurrent major depressive disorder ([place])    Health care maintenance 10/26/2019   Right knee pain 10/26/2019   Generalized anxiety disorder 10/14/2019   MDD (major depressive disorder), recurrent  severe, without psychosis ([place]) 07/27/2019   Social anxiety disorder 07/27/2019   Left-sided chest wall pain 55/11/5866   Alcoholic cirrhosis of liver without ascites ([place]) 06/14/2019   Alcohol abuse 06/14/2019   AKI (acute kidney injury) ([place]) 05/27/2019   Alcohol withdrawal syndrome without complication ([place]) 25/74/9355   Alcoholic intoxication without complication ([place])    Sepsis ([place]) 03/08/2019   Breast lump or mass 10/04/2018   Sepsis secondary to UTI ([place]) 09/14/2018   Asthma 07/07/2018   GERD (gastroesophageal reflux disease) 07/07/2018   OCD (obsessive compulsive disorder) 21/74/7159   Self-inflicted laceration of left wrist ([place]) 03/10/2018   Leg hematoma 12/25/2017   Suicide and self-inflicted injury by cutting and piercing instrument ([place]) 03/10/2017   Severe recurrent major 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 ([place]) 03/09/2017   Substance induced mood disorder ([place]) 08/15/2016   Involuntary commitment 08/15/2016   Alcohol use disorder, severe, dependence ([place]) 02/05/2016   Hypertension 12/05/2015   Tachycardia 12/05/2015   Gout 11/13/2015   Chronic back pain 05/02/2015    Past Surgical History:  Procedure Laterality Date   CHOLECYSTECTOMY  2012   COLONOSCOPY     COLONOSCOPY WITH PROPOFOL N/A 05/28/2020   Procedure: COLONOSCOPY WITH PROPOFOL;  Surgeon: [name], MD;  Location: [facility] ENDOSCOPY;  Service: Gastroenterology;  Laterality: N/A;   EXTRACORPOREAL SHOCK WAVE LITHOTRIPSY Left 01/12/2019   Procedure: EXTRACORPOREAL  SHOCK WAVE LITHOTRIPSY (ESWL);  Surgeon: Billey Co, MD;  Location: [facility] ORS;  Service: Urology;  Laterality: Left;   UPPER GI ENDOSCOPY      Prior to Admission medications   Medication Sig Start Date End Date Taking? Authorizing Provider  ADVAIR DISKUS 100-50 MCG/ACT AEPB INHALE 1 PUFF 2 TIMES A DAY. RINSE MOUTH AND SPIT AFTER EACH USE. 06/12/20 06/19/21  Tawni Millers, MD  allopurinol (ZYLOPRIM) 300 MG tablet TAKE ONE TABLET BY MOUTH EVERY  DAY 04/15/21 11/01/21  Iloabachie, Chioma E, NP  ascorbic acid (VITAMIN C) 500 MG tablet Take by mouth. 08/01/19   [provider]  fluticasone (FLONASE) 50 MCG/ACT nasal spray SPRAY ONE SPRAY INTO BOTH NOSTRILS ONCE DAILY. 05/16/21 05/16/22  Lannie Fields, PA-C  folic acid (FOLVITE) 1 MG tablet TAKE ONE TABLET BY MOUTH EVERY DAY 05/21/21 05/21/22  Iloabachie, Chioma E, NP  lisinopril (ZESTRIL) 40 MG tablet TAKE ONE TABLET BY MOUTH EVERY DAY 04/15/21 04/15/22  Iloabachie, Chioma E, NP  loratadine (CLARITIN) 10 MG tablet Take 10 mg by mouth daily.    [provider]  metoprolol tartrate (LOPRESSOR) 25 MG tablet Take 1 tablet (25 mg total) by mouth once daily. 05/21/21 06/25/21  Iloabachie, Chioma E, NP  mirtazapine (REMERON) 15 MG tablet Take 1 tablet (15 mg total) by mouth at bedtime. 05/21/21 06/25/21  Iloabachie, Chioma E, NP  pantoprazole (PROTONIX) 40 MG tablet TAKE 1 TABLET (40 MG TOTAL) BY MOUTH DAILY. 10/15/20 10/15/21  Iloabachie, Chioma E, NP  pseudoephedrine (SUDAFED) 120 MG 12 hr tablet Take 120 mg by mouth daily.    [provider]  thiamine (VITAMIN B-1) 100 MG tablet TAKE ONE TABLET BY MOUTH EVERY DAY 10/22/20 10/22/21  Iloabachie, Chioma E, NP  albuterol (VENTOLIN HFA) 108 (90 Base) MCG/ACT inhaler Inhale 2 puffs into the lungs every 4 (four) hours as needed. 05/27/21 05/27/22  Iloabachie, Chioma E, NP    Allergies Patient has no known allergies.  Family History  Problem Relation Age of Onset   Alcohol abuse Father    Breast cancer Mother 32   Anxiety disorder Brother    Other Maternal Grandmother        unknown medical history   Other Maternal Grandfather        unknow medical history   Other Paternal Grandmother        unknown medical history   Other Paternal Grandfather        unknown medical history   Healthy Brother     Social History Social History   Tobacco Use   Smoking status: Former    Types: Cigarettes    Quit date: 03/19/1981    Years since  quitting: 40.2   Smokeless tobacco: Never  Vaping Use   Vaping Use: Never used  Substance Use Topics   Alcohol use: Not Currently    Alcohol/week: 12.0 standard drinks    Types: 12 Shots of liquor per week    Comment: last use 05/03/21 (none since ED visit)   Drug use: No      Review of Systems Constitutional: No fever/chills, dizzy Eyes: No visual changes. ENT: Sore throat.  Congestion Cardiovascular: Denies chest pain. Respiratory: Denies shortness of breath. Gastrointestinal: No abdominal pain.  No nausea, no vomiting.  No diarrhea.  No constipation. Genitourinary: Negative for dysuria. Musculoskeletal: Negative for back pain. Skin: Negative for rash. Neurological: Negative for headaches, focal weakness or numbness. All other ROS negative ____________________________________________   PHYSICAL EXAM:  VITAL SIGNS:  ED Triage Vitals  Enc Vitals Group     BP 06/17/21 2058 (!) 139/93     Pulse Rate 06/17/21 2058 (!) 102     Resp 06/17/21 2058 18     Temp 06/17/21 2058 98 F (36.7 C)     Temp Source 06/17/21 2058 Oral     SpO2 06/17/21 2058 96 %     Weight --      Height --      Head Circumference --      Peak Flow --      Pain Score 06/17/21 2101 6     Pain Loc --      Pain Edu? --      Excl. in [place]? --     Constitutional: Alert and oriented. Well appearing and in no acute distress. Eyes: Conjunctivae are normal. EOMI. Head: Atraumatic. Nose: No congestion/rhinnorhea. Mouth/Throat: Mucous membranes are moist.  OP appears normal. Neck: No stridor. Trachea Midline. FROM Cardiovascular: Normal rate, regular rhythm. Grossly normal heart sounds.  Good peripheral circulation. Respiratory: Normal respiratory effort.  No retractions. Lungs CTAB. Gastrointestinal: Soft and nontender. No distention. No abdominal bruits.  Musculoskeletal: No lower extremity tenderness nor edema.  No joint effusions. Neurologic:  Normal speech and language. No gross focal neurologic  deficits are appreciated.  Moving all extremities well. Skin:  Skin is warm, dry and intact. No rash noted. Psychiatric: Mood and affect are normal. Speech and behavior are normal. GU: Deferred   ____________________________________________   LABS (all labs ordered are listed, but only abnormal results are displayed)  Labs Reviewed  BASIC METABOLIC PANEL - Abnormal; Notable for the following components:      Result Value   Glucose, Bld 140 (*)    All other components within normal limits  CBC - Abnormal; Notable for the following components:   RDW 16.9 (*)    All other components within normal limits  URINALYSIS, COMPLETE (UACMP) WITH MICROSCOPIC  ETHANOL  CBG MONITORING, ED   ____________________________________________   ED ECG REPORT I, Vanessa , the attending physician, personally viewed and interpreted this ECG.  Sinus tachycardia rate of 104, no ST elevation, no T wave versions, normal intervals ____________________________________________  RADIOLOGY Robert Bellow, personally viewed and evaluated these images (plain radiographs) as part of my medical decision making, as well as reviewing the written report by the radiologist.  ED MD interpretation: No pneumonia  Official radiology report(s): DG Chest 2 View  Result Date: 06/17/2021 CLINICAL DATA:  Throat pain EXAM: CHEST - 2 VIEW COMPARISON:  06/10/2021 FINDINGS: The heart size and mediastinal contours are within normal limits. Both lungs are clear. Atelectasis or scarring at the lingula. The visualized skeletal structures are unremarkable. IMPRESSION: No active cardiopulmonary disease. Electronically Signed   By: Donavan Foil M.D.   On: 06/17/2021 22:32    ____________________________________________   PROCEDURES  Procedure(s) performed (including Critical Care):  Procedures   ____________________________________________   INITIAL IMPRESSION / ASSESSMENT AND PLAN / ED COURSE  Miguel [name] was  evaluated in Emergency Department on 06/17/2021 for the symptoms described in the history of present illness. He was evaluated in the context of the global COVID-19 pandemic, which necessitated consideration that the patient might be at risk for infection with the SARS-CoV-2 virus that causes COVID-19. Institutional protocols and algorithms that pertain to the evaluation of patients at risk for COVID-19 are in a state of rapid change based on information released by regulatory bodies including the CDC and federal  and state organizations. These policies and algorithms were followed during the patient's care in the ED.    Patient comes in for multiple symptoms.  He is well-known to the emergency room.  Labs ordered to evaluate for Electra abnormalities, AKI, ACS, chest x-ray evaluate for pneumonia and COVID evaluate for flu.  Patient is afebrile forage motion neck and low suspicion for other acute pathology.  His oropharynx exam is reassuring.  Patient handed off to oncoming team pending the rest of his labs and anticipate discharge home once clinically sober.  Patient was stating that he felt like he was withdrawing.  Does have some slight tremors of his hands but no tongue fasc. I suspect that he still has some intoxication on board but he has high likelihood of withdrawing while being here so we will give him 1 dose of Ativan while waiting labs.  Denies SI does not meet any IVC criteria at this time         ____________________________________________   FINAL CLINICAL IMPRESSION(S) / ED DIAGNOSES   Final diagnoses:  Alcoholic intoxication without complication ([place])      MEDICATIONS GIVEN DURING THIS VISIT:  Medications  sodium chloride 0.9 % bolus 1,000 mL (1,000 mLs Intravenous Bolus 06/17/21 2208)  ondansetron (ZOFRAN) injection 4 mg (4 mg Intravenous Given 06/17/21 2207)  LORazepam (ATIVAN) tablet 1 mg (1 mg Oral Given 06/17/21 2312)     ED Discharge Orders     None         Note:  This document was prepared using Dragon voice recognition software and may include unintentional dictation errors.    Vanessa [employer], MD 06/17/21 [phone]

## 2021-06-17 NOTE — ED Triage Notes (Addendum)
Pt presents to ER c/o nose and throat pain that started yesterday after doing work on his car.  Pt states he is also dizzy and feels nauseous.  Pt A&O x4 at this time and appears somewhat wobbly on his feet.  Pt states he had "2 shots" today.

## 2021-06-17 NOTE — ED Notes (Signed)
Pt assisted to bathroom with a steady gate and tech at side. Pt continued to ask for medicine for his shakes and again told that it can be dangerous since is [place] is still very high. Pt said he also may be shaking because he hasn't eaten anything. Tech provided tech with sandwich tray and ginger ale. Pt sitting on bed at this time and is calm.

## 2021-06-18 LAB — RESP PANEL BY RT-PCR (FLU A&B, COVID) ARPGX2
Influenza A by PCR: NEGATIVE
Influenza B by PCR: NEGATIVE
SARS Coronavirus 2 by RT PCR: NEGATIVE

## 2021-06-18 NOTE — ED Notes (Signed)
Cab was called for patient with no voucher, patient stated he had money to pay for it

## 2021-06-19 ENCOUNTER — Ambulatory Visit: Payer: Medicaid Other | Admitting: Gerontology

## 2021-06-23 ENCOUNTER — Emergency Department: Payer: Medicaid Other

## 2021-06-23 ENCOUNTER — Emergency Department
Admission: EM | Admit: 2021-06-23 | Discharge: 2021-06-23 | Disposition: A | Discharge status: Home / Self Care | Payer: Medicaid Other | Attending: Student in an Organized Health Care Education/Training Program | Admitting: Student in an Organized Health Care Education/Training Program

## 2021-06-23 ENCOUNTER — Other Ambulatory Visit: Payer: Self-pay

## 2021-06-23 ENCOUNTER — Encounter: Payer: Self-pay | Admitting: Intensive Care

## 2021-06-23 DIAGNOSIS — I10 Essential (primary) hypertension: Secondary | ICD-10-CM | POA: Insufficient documentation

## 2021-06-23 DIAGNOSIS — G4700 Insomnia, unspecified: Secondary | ICD-10-CM | POA: Insufficient documentation

## 2021-06-23 DIAGNOSIS — M545 Low back pain, unspecified: Secondary | ICD-10-CM | POA: Insufficient documentation

## 2021-06-23 DIAGNOSIS — J45909 Unspecified asthma, uncomplicated: Secondary | ICD-10-CM | POA: Insufficient documentation

## 2021-06-23 DIAGNOSIS — R109 Unspecified abdominal pain: Secondary | ICD-10-CM | POA: Insufficient documentation

## 2021-06-23 DIAGNOSIS — M542 Cervicalgia: Secondary | ICD-10-CM | POA: Insufficient documentation

## 2021-06-23 DIAGNOSIS — Z79899 Other long term (current) drug therapy: Secondary | ICD-10-CM | POA: Insufficient documentation

## 2021-06-23 DIAGNOSIS — Z87891 Personal history of nicotine dependence: Secondary | ICD-10-CM | POA: Insufficient documentation

## 2021-06-23 LAB — COMPREHENSIVE METABOLIC PANEL
ALT: 16 U/L (ref 0–44)
AST: 18 U/L (ref 15–41)
Albumin: 3.9 g/dL (ref 3.5–5.0)
Alkaline Phosphatase: 73 U/L (ref 38–126)
Anion gap: 8 (ref 5–15)
BUN: 13 mg/dL (ref 8–23)
CO2: 22 mmol/L (ref 22–32)
Calcium: 8.4 mg/dL — ABNORMAL LOW (ref 8.9–10.3)
Chloride: 105 mmol/L (ref 98–111)
Creatinine, Ser: 1.09 mg/dL (ref 0.61–1.24)
GFR, Estimated: >60 mL/min (ref >60)
Glucose, Bld: 121 mg/dL — ABNORMAL HIGH (ref 70–99)
Potassium: 3.5 mmol/L (ref 3.5–5.1)
Sodium: 135 mmol/L (ref 135–145)
Total Bilirubin: 2.1 mg/dL — ABNORMAL HIGH (ref 0.3–1.2)
Total Protein: 6.9 g/dL (ref 6.5–8.1)

## 2021-06-23 LAB — CBC
HCT: 39.1 % (ref 39.0–52.0)
Hemoglobin: 13.1 g/dL (ref 13.0–17.0)
MCH: 28.1 pg (ref 26.0–34.0)
MCHC: 33.5 g/dL (ref 30.0–36.0)
MCV: 83.7 fL (ref 80.0–100.0)
Platelets: 169 10*3/uL (ref 150–400)
RBC: 4.67 MIL/uL (ref 4.22–5.81)
RDW: 16.3 % — ABNORMAL HIGH (ref 11.5–15.5)
WBC: 6.3 10*3/uL (ref 4.0–10.5)
nRBC: 0 % (ref 0.0–0.2)

## 2021-06-23 LAB — URINE DRUG SCREEN, QUALITATIVE (ARMC ONLY)
Amphetamines, Ur Screen: NOT DETECTED
Barbiturates, Ur Screen: NOT DETECTED
Benzodiazepine, Ur Scrn: POSITIVE — AB
Cannabinoid 50 Ng, Ur ~~LOC~~: POSITIVE — AB
Cocaine Metabolite,Ur ~~LOC~~: NOT DETECTED
MDMA (Ecstasy)Ur Screen: NOT DETECTED
Methadone Scn, Ur: NOT DETECTED
Opiate, Ur Screen: NOT DETECTED
Phencyclidine (PCP) Ur S: NOT DETECTED
Tricyclic, Ur Screen: NOT DETECTED

## 2021-06-23 LAB — URINALYSIS, COMPLETE (UACMP) WITH MICROSCOPIC
Bilirubin Urine: NEGATIVE
Glucose, UA: NEGATIVE mg/dL
Hgb urine dipstick: NEGATIVE
Ketones, ur: 20 mg/dL — AB
Nitrite: NEGATIVE
Protein, ur: 30 mg/dL — AB
Specific Gravity, Urine: 1.021 (ref 1.005–1.030)
pH: 5 (ref 5.0–8.0)

## 2021-06-23 LAB — LIPASE, BLOOD: Lipase: 29 U/L (ref 11–51)

## 2021-06-23 MED ORDER — HYDROXYZINE HCL 10 MG PO TABS
10.0000 mg | ORAL_TABLET | Freq: Every evening | ORAL | 0 refills | Status: DC | PRN
  Filled 2021-06-23: qty 8, 8d supply, fill #0

## 2021-06-23 MED ORDER — CEPHALEXIN 500 MG PO CAPS: 500.0000 mg | ORAL_CAPSULE | Freq: Two times a day (BID) | ORAL | 0 refills | Status: AC

## 2021-06-23 MED ORDER — CEPHALEXIN 500 MG PO CAPS
500.0000 mg | ORAL_CAPSULE | Freq: Two times a day (BID) | ORAL | 0 refills | Status: DC
  Filled 2021-06-23: qty 10, 5d supply, fill #0

## 2021-06-23 MED ORDER — CHLORDIAZEPOXIDE HCL 25 MG PO CAPS
25.0000 mg | ORAL_CAPSULE | Freq: Once | ORAL | Status: AC
  Administered 2021-06-23: 25 mg via ORAL
  Filled 2021-06-23: qty 1

## 2021-06-23 MED ORDER — HYDROXYZINE HCL 10 MG PO TABS: 10.0000 mg | ORAL_TABLET | Freq: Every evening | ORAL | 0 refills | Status: DC | PRN

## 2021-06-23 NOTE — ED Notes (Addendum)
Pt c/o painful urination. Amber, RN made aware.

## 2021-06-23 NOTE — ED Notes (Signed)
Pt to ER via EMS from home with back pain, shoulder pain and n/v since yesterday.  NAD noted at this time.

## 2021-06-23 NOTE — ED Triage Notes (Signed)
Patient c/o lower back pain, right sided neck pain, nausea and generalized pain all over. Reports last drink was Friday. Little tremor noticed when holding hands up

## 2021-06-23 NOTE — ED Provider Notes (Signed)
[facility] Emergency Department Provider Note    Event Date/Time   First MD Initiated Contact with Patient 06/23/21 1706     (approximate)  I have reviewed the triage vital signs and the nursing notes.   HISTORY  Chief Complaint Back Pain and Neck Pain    HPI RAVEN HARMES is a 60 y.o. male known to this facility with the below listed past medical history presents to the ER for evaluation of difficulty sleeping due to achy back pain.  Has noted darker colored urine.  States last drink alcohol was on Saturday.  Denies any epigastric pain at this time.  No melena or hematochezia no fevers.  No chest pain or shortness of breath.  Does feel slightly anxious.  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    OCD (obsessive compulsive disorder)    Renal colic    Family History  Problem Relation Age of Onset   Alcohol abuse Father    Breast cancer Mother 97   Anxiety disorder Brother    Other Maternal Grandmother        unknown medical history   Other Maternal Grandfather        unknow medical history   Other Paternal Grandmother        unknown medical history   Other Paternal Grandfather        unknown medical history   Healthy Brother    Past Surgical History:  Procedure Laterality Date   CHOLECYSTECTOMY  2012   COLONOSCOPY     COLONOSCOPY WITH PROPOFOL N/A 05/28/2020   Procedure: COLONOSCOPY WITH PROPOFOL;  Surgeon: [name], MD;  Location: [facility] ENDOSCOPY;  Service: Gastroenterology;  Laterality: N/A;   EXTRACORPOREAL SHOCK WAVE LITHOTRIPSY Left 01/12/2019   Procedure: EXTRACORPOREAL SHOCK WAVE LITHOTRIPSY (ESWL);  Surgeon: Billey Co, MD;  Location: [facility] ORS;  Service: Urology;  Laterality:  Left;   UPPER GI ENDOSCOPY     Patient Active Problem List   Diagnosis Date Noted   Hypotension 11/28/2020   Genetic testing 28/31/5176   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    Right ankle pain 06/26/2020   Family history of breast cancer    Hx of colonic polyps    Hospital discharge follow-up 06/05/2020   Kidney function test abnormal 06/05/2020   Abdominal pain 04/12/2020   Hypertensive urgency 04/12/2020   Alcohol withdrawal ([place]) 03/04/2020   Right-sided chest wall pain 02/28/2020   Alcohol abuse with alcohol-induced mood disorder ([place]) 12/02/2019   Moderate episode of recurrent major depressive disorder ([place])    Health care maintenance 10/26/2019   Right knee pain 10/26/2019   Generalized anxiety disorder 10/14/2019   MDD (major depressive disorder), recurrent severe, without psychosis ([place]) 07/27/2019   Social anxiety disorder 07/27/2019   Left-sided chest wall pain 16/05/3709   Alcoholic cirrhosis of liver without ascites ([place]) 06/14/2019   Alcohol abuse 06/14/2019   AKI (acute kidney injury) ([place]) 05/27/2019   Alcohol withdrawal syndrome without complication ([place]) 62/69/4854   Alcoholic intoxication without complication ([place])    Sepsis ([place]) 03/08/2019   Breast lump or mass 10/04/2018  Sepsis secondary to UTI ([place]) 09/14/2018   Asthma 07/07/2018   GERD (gastroesophageal reflux disease) 07/07/2018   OCD (obsessive compulsive disorder) 19/62/2297   Self-inflicted laceration of left wrist ([place]) 03/10/2018   Leg hematoma 12/25/2017   Suicide and self-inflicted injury by cutting and piercing instrument ([place]) 03/10/2017   Severe recurrent major 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 ([place]) 03/09/2017   Substance induced mood disorder ([place]) 08/15/2016   Involuntary commitment 08/15/2016   Alcohol use disorder, severe, dependence ([place]) 02/05/2016   Hypertension 12/05/2015   Tachycardia 12/05/2015   Gout 11/13/2015   Chronic back pain 05/02/2015       Prior to Admission medications   Medication Sig Start Date End Date Taking? Authorizing Provider  ADVAIR DISKUS 100-50 MCG/ACT AEPB INHALE 1 PUFF 2 TIMES A DAY. RINSE MOUTH AND SPIT AFTER EACH USE. 06/12/20 06/19/21  Tawni Millers, MD  allopurinol (ZYLOPRIM) 300 MG tablet TAKE ONE TABLET BY MOUTH EVERY DAY 04/15/21 11/01/21  Iloabachie, Chioma E, NP  ascorbic acid (VITAMIN C) 500 MG tablet Take by mouth. 08/01/19   [provider]  cephALEXin (KEFLEX) 500 MG capsule Take 1 capsule (500 mg total) by mouth 2 (two) times daily for 5 days. 06/23/21 06/28/21  Merlyn Lot, MD  fluticasone (FLONASE) 50 MCG/ACT nasal spray SPRAY ONE SPRAY INTO BOTH NOSTRILS ONCE DAILY. 05/16/21 05/16/22  Lannie Fields, PA-C  folic acid (FOLVITE) 1 MG tablet TAKE ONE TABLET BY MOUTH EVERY DAY 05/21/21 05/21/22  Iloabachie, Chioma E, NP  hydrOXYzine (ATARAX/VISTARIL) 10 MG tablet Take 1 tablet (10 mg total) by mouth at bedtime as needed (sleep). 06/23/21   Merlyn Lot, MD  lisinopril (ZESTRIL) 40 MG tablet TAKE ONE TABLET BY MOUTH EVERY DAY 04/15/21 04/15/22  Iloabachie, Chioma E, NP  loratadine (CLARITIN) 10 MG tablet Take 10 mg by mouth daily.    [provider]  metoprolol tartrate (LOPRESSOR) 25 MG tablet Take 1 tablet (25 mg total) by mouth once daily. 05/21/21 06/25/21  Iloabachie, Chioma E, NP  mirtazapine (REMERON) 15 MG tablet Take 1 tablet (15 mg total) by mouth at bedtime. 05/21/21 06/25/21  Iloabachie, Chioma E, NP  pantoprazole (PROTONIX) 40 MG tablet TAKE 1 TABLET (40 MG TOTAL) BY MOUTH DAILY. 10/15/20 10/15/21  Iloabachie, Chioma E, NP  pseudoephedrine (SUDAFED) 120 MG 12 hr tablet Take 120 mg by mouth daily.    [provider]  thiamine (VITAMIN B-1) 100 MG tablet TAKE ONE TABLET BY MOUTH EVERY DAY 10/22/20 10/22/21  Iloabachie, Chioma E, NP  albuterol (VENTOLIN HFA) 108 (90 Base) MCG/ACT inhaler Inhale 2 puffs into the lungs every 4 (four) hours as needed. 05/27/21 05/27/22  Iloabachie,  Chioma E, NP    Allergies Patient has no known allergies.    Social History Social History   Tobacco Use   Smoking status: Former    Types: Cigarettes    Quit date: 03/19/1981    Years since quitting: 40.2   Smokeless tobacco: Never  Vaping Use   Vaping Use: Never used  Substance Use Topics   Alcohol use: Yes    Alcohol/week: 24.0 standard drinks    Types: 24 Shots of liquor per week    Comment: LAst drink Friday 06/20/21   Drug use: No    Review of Systems Patient denies headaches, rhinorrhea, blurry vision, numbness, shortness of breath, chest pain, edema, cough, abdominal pain, nausea, vomiting, diarrhea, dysuria, fevers, rashes or hallucinations unless otherwise stated above in HPI. ____________________________________________   PHYSICAL EXAM:  VITAL SIGNS: Vitals:  06/23/21 1620  BP: (!) 180/104  Pulse: 87  Resp: 18  Temp: 98.6 F (37 C)  SpO2: 96%    Constitutional: Alert and oriented.  Eyes: Conjunctivae are normal.  Head: Atraumatic. Nose: No congestion/rhinnorhea. Mouth/Throat: Mucous membranes are moist.   Neck: No stridor. Painless ROM.  Cardiovascular: Normal rate, regular rhythm. Grossly normal heart sounds.  Good peripheral circulation. Respiratory: Normal respiratory effort.  No retractions. Lungs CTAB. Gastrointestinal: Soft and nontender. No distention. No abdominal bruits. No CVA tenderness. Genitourinary:  Musculoskeletal: No lower extremity tenderness nor edema.  No joint effusions. Neurologic:  Normal speech and language. No gross focal neurologic deficits are appreciated. No facial droop Skin:  Skin is warm, dry and intact. No rash noted. Psychiatric: Mood and affect are normal. Speech and behavior are normal.  ____________________________________________   LABS (all labs ordered are listed, but only abnormal results are displayed)  Results for orders placed or performed during the hospital encounter of 06/23/21 (from the past 24  hour(s))  Comprehensive metabolic panel     Status: Abnormal   Collection Time: 06/23/21  4:28 PM  Result Value Ref Range   Sodium 135 135 - 145 mmol/L   Potassium 3.5 3.5 - 5.1 mmol/L   Chloride 105 98 - 111 mmol/L   CO2 22 22 - 32 mmol/L   Glucose, Bld 121 (H) 70 - 99 mg/dL   BUN 13 8 - 23 mg/dL   Creatinine, Ser 1.09 0.61 - 1.24 mg/dL   Calcium 8.4 (L) 8.9 - 10.3 mg/dL   Total Protein 6.9 6.5 - 8.1 g/dL   Albumin 3.9 3.5 - 5.0 g/dL   AST 18 15 - 41 U/L   ALT 16 0 - 44 U/L   Alkaline Phosphatase 73 38 - 126 U/L   Total Bilirubin 2.1 (H) 0.3 - 1.2 mg/dL   GFR, Estimated >60 >60 mL/min   Anion gap 8 5 - 15  cbc     Status: Abnormal   Collection Time: 06/23/21  4:28 PM  Result Value Ref Range   WBC 6.3 4.0 - 10.5 K/uL   RBC 4.67 4.22 - 5.81 MIL/uL   Hemoglobin 13.1 13.0 - 17.0 g/dL   HCT 39.1 39.0 - 52.0 %   MCV 83.7 80.0 - 100.0 fL   MCH 28.1 26.0 - 34.0 pg   MCHC 33.5 30.0 - 36.0 g/dL   RDW 16.3 (H) 11.5 - 15.5 %   Platelets 169 150 - 400 K/uL   nRBC 0.0 0.0 - 0.2 %  Urine Drug Screen, Qualitative     Status: Abnormal   Collection Time: 06/23/21  4:28 PM  Result Value Ref Range   Tricyclic, Ur Screen NONE DETECTED NONE DETECTED   Amphetamines, Ur Screen NONE DETECTED NONE DETECTED   MDMA (Ecstasy)Ur Screen NONE DETECTED NONE DETECTED   Cocaine Metabolite,Ur [place] NONE DETECTED NONE DETECTED   Opiate, Ur Screen NONE DETECTED NONE DETECTED   Phencyclidine (PCP) Ur S NONE DETECTED NONE DETECTED   Cannabinoid 50 Ng, Ur [place] POSITIVE (A) NONE DETECTED   Barbiturates, Ur Screen NONE DETECTED NONE DETECTED   Benzodiazepine, Ur Scrn POSITIVE (A) NONE DETECTED   Methadone Scn, Ur NONE DETECTED NONE DETECTED  Urinalysis, Complete w Microscopic     Status: Abnormal   Collection Time: 06/23/21  4:28 PM  Result Value Ref Range   Color, Urine YELLOW (A) YELLOW   APPearance HAZY (A) CLEAR   Specific Gravity, Urine 1.021 1.005 - 1.030   pH 5.0 5.0 -  8.0   Glucose, UA NEGATIVE  NEGATIVE mg/dL   Hgb urine dipstick NEGATIVE NEGATIVE   Bilirubin Urine NEGATIVE NEGATIVE   Ketones, ur 20 (A) NEGATIVE mg/dL   Protein, ur 30 (A) NEGATIVE mg/dL   Nitrite NEGATIVE NEGATIVE   Leukocytes,Ua MODERATE (A) NEGATIVE   RBC / HPF 0-5 0 - 5 RBC/hpf   WBC, UA 11-20 0 - 5 WBC/hpf   Bacteria, UA RARE (A) NONE SEEN   Squamous Epithelial / LPF 0-5 0 - 5   Mucus PRESENT   Lipase, blood     Status: None   Collection Time: 06/23/21  4:28 PM  Result Value Ref Range   Lipase 29 11 - 51 U/L   ____________________________________________  EKG____________________________________________  RADIOLOGY  I personally reviewed all radiographic images ordered to evaluate for the above acute complaints and reviewed radiology reports and findings.  These findings were personally discussed with the patient.  Please see medical record for radiology report.  ____________________________________________   PROCEDURES  Procedure(s) performed:  Procedures    Critical Care performed: no ____________________________________________   INITIAL IMPRESSION / ASSESSMENT AND PLAN / ED COURSE  Pertinent labs & imaging results that were available during my care of the patient were reviewed by me and considered in my medical decision making (see chart for details).   DDX: stone, pyelo, dehydration, withdrawal, biliary path, pancreatitis  NYLAN NEVEL is a 60 y.o. who presents to the ED with condition as described above.  Patient appears well-appearing and in no acute distress.  Describing muscle aches and back pain as well as some polyuria and dysuria.  Has a history of kidney stones.  CT imaging ordered to evaluate for above differential shows no evidence of ureterolithiasis.  Urinalysis with white cells.  Given his symptoms will cover with antibiotics is not showing signs of sepsis.  His abdominal exam is otherwise soft and benign.  Also asking for something to help with sleep.  He does not appear to  be in acute withdrawal.  Does appear stable and appropriate for outpatient follow-up.     The patient was evaluated in Emergency Department today for the symptoms described in the history of present illness. He/she was evaluated in the context of the global COVID-19 pandemic, which necessitated consideration that the patient might be at risk for infection with the SARS-CoV-2 virus that causes COVID-19. Institutional protocols and algorithms that pertain to the evaluation of patients at risk for COVID-19 are in a state of rapid change based on information released by regulatory bodies including the CDC and federal and state organizations. These policies and algorithms were followed during the patient's care in the ED.  As part of my medical decision making, I reviewed the following data within the [place] notes reviewed and incorporated, Labs reviewed, notes from prior ED visits and [place] Controlled Substance Database   ____________________________________________   FINAL CLINICAL IMPRESSION(S) / ED DIAGNOSES  Final diagnoses:  Low back pain, unspecified back pain laterality, unspecified chronicity, unspecified whether sciatica present  Insomnia, unspecified type      NEW MEDICATIONS STARTED DURING THIS VISIT:  Current Discharge Medication List     START taking these medications   Details  cephALEXin (KEFLEX) 500 MG capsule Take 1 capsule (500 mg total) by mouth 2 (two) times daily for 5 days. Qty: 10 capsule, Refills: 0    hydrOXYzine (ATARAX/VISTARIL) 10 MG tablet Take 1 tablet (10 mg total) by mouth at bedtime as needed (sleep). Qty: 8  tablet, Refills: 0         Note:  This document was prepared using Dragon voice recognition software and may include unintentional dictation errors.    Merlyn Lot, MD 06/23/21 [phone]

## 2021-06-23 NOTE — ED Notes (Signed)
See triage note  Presents stating that he just doesn't feel well  No fever or n/v or fever  States is also having some lower back pain  Denies nay specific injury but states he did some work on his car prior to sx's starting

## 2021-06-24 ENCOUNTER — Other Ambulatory Visit: Payer: Self-pay

## 2021-06-27 ENCOUNTER — Encounter: Payer: Self-pay | Admitting: Licensed Clinical Social Worker

## 2021-06-27 NOTE — Progress Notes (Signed)
UPDATE:  VUS in TERT called [id number] has been reclassified as "Likely Benign." The report date is 06/26/2021.

## 2021-06-28 ENCOUNTER — Emergency Department: Payer: Self-pay

## 2021-06-28 ENCOUNTER — Emergency Department
Admission: EM | Admit: 2021-06-28 | Discharge: 2021-06-28 | Disposition: A | Discharge status: Home / Self Care | Payer: Self-pay | Attending: Emergency Medicine | Admitting: Emergency Medicine

## 2021-06-28 ENCOUNTER — Other Ambulatory Visit: Payer: Self-pay

## 2021-06-28 DIAGNOSIS — J45909 Unspecified asthma, uncomplicated: Secondary | ICD-10-CM | POA: Insufficient documentation

## 2021-06-28 DIAGNOSIS — K219 Gastro-esophageal reflux disease without esophagitis: Secondary | ICD-10-CM | POA: Insufficient documentation

## 2021-06-28 DIAGNOSIS — I10 Essential (primary) hypertension: Secondary | ICD-10-CM | POA: Insufficient documentation

## 2021-06-28 DIAGNOSIS — R1011 Right upper quadrant pain: Secondary | ICD-10-CM

## 2021-06-28 DIAGNOSIS — Z79899 Other long term (current) drug therapy: Secondary | ICD-10-CM | POA: Insufficient documentation

## 2021-06-28 DIAGNOSIS — Z87891 Personal history of nicotine dependence: Secondary | ICD-10-CM | POA: Insufficient documentation

## 2021-06-28 DIAGNOSIS — K2970 Gastritis, unspecified, without bleeding: Secondary | ICD-10-CM | POA: Insufficient documentation

## 2021-06-28 LAB — CBC
HCT: 39.2 % (ref 39.0–52.0)
Hemoglobin: 12.7 g/dL — ABNORMAL LOW (ref 13.0–17.0)
MCH: 27.1 pg (ref 26.0–34.0)
MCHC: 32.4 g/dL (ref 30.0–36.0)
MCV: 83.6 fL (ref 80.0–100.0)
Platelets: 195 10*3/uL (ref 150–400)
RBC: 4.69 MIL/uL (ref 4.22–5.81)
RDW: 17.2 % — ABNORMAL HIGH (ref 11.5–15.5)
WBC: 5.8 10*3/uL (ref 4.0–10.5)
nRBC: 0 % (ref 0.0–0.2)

## 2021-06-28 LAB — LIPASE, BLOOD: Lipase: 29 U/L (ref 11–51)

## 2021-06-28 LAB — URINALYSIS, COMPLETE (UACMP) WITH MICROSCOPIC
Bacteria, UA: NONE SEEN
Bilirubin Urine: NEGATIVE
Glucose, UA: NEGATIVE mg/dL
Hgb urine dipstick: NEGATIVE
Ketones, ur: NEGATIVE mg/dL
Leukocytes,Ua: NEGATIVE
Nitrite: NEGATIVE
Protein, ur: NEGATIVE mg/dL
Specific Gravity, Urine: 1.021 (ref 1.005–1.030)
pH: 5 (ref 5.0–8.0)

## 2021-06-28 LAB — COMPREHENSIVE METABOLIC PANEL
ALT: 16 U/L (ref 0–44)
AST: 17 U/L (ref 15–41)
Albumin: 4.2 g/dL (ref 3.5–5.0)
Alkaline Phosphatase: 59 U/L (ref 38–126)
Anion gap: 11 (ref 5–15)
BUN: 18 mg/dL (ref 8–23)
CO2: 19 mmol/L — ABNORMAL LOW (ref 22–32)
Calcium: 8.9 mg/dL (ref 8.9–10.3)
Chloride: 108 mmol/L (ref 98–111)
Creatinine, Ser: 1 mg/dL (ref 0.61–1.24)
GFR, Estimated: >60 mL/min (ref >60)
Glucose, Bld: 112 mg/dL — ABNORMAL HIGH (ref 70–99)
Potassium: 4.3 mmol/L (ref 3.5–5.1)
Sodium: 138 mmol/L (ref 135–145)
Total Bilirubin: 0.8 mg/dL (ref 0.3–1.2)
Total Protein: 6.8 g/dL (ref 6.5–8.1)

## 2021-06-28 LAB — MAGNESIUM: Magnesium: 1.6 mg/dL — ABNORMAL LOW (ref 1.7–2.4)

## 2021-06-28 MED ORDER — ALUM & MAG HYDROXIDE-SIMETH 200-200-20 MG/5ML PO SUSP
30.0000 mL | Freq: Once | ORAL | Status: AC
  Administered 2021-06-28: 30 mL via ORAL
  Filled 2021-06-28: qty 30

## 2021-06-28 MED ORDER — LORAZEPAM 2 MG/ML IJ SOLN: 0.0000 mg | Freq: Two times a day (BID) | INTRAMUSCULAR | Status: DC

## 2021-06-28 MED ORDER — MORPHINE SULFATE (PF) 4 MG/ML IV SOLN
6.0000 mg | Freq: Once | INTRAVENOUS | Status: AC
  Administered 2021-06-28: 6 mg via INTRAVENOUS
  Filled 2021-06-28: qty 2

## 2021-06-28 MED ORDER — LISINOPRIL 10 MG PO TABS: 40.0000 mg | ORAL_TABLET | Freq: Every day | ORAL | Status: DC

## 2021-06-28 MED ORDER — METOPROLOL TARTRATE 25 MG PO TABS: 25.0000 mg | ORAL_TABLET | Freq: Every day | ORAL | Status: DC

## 2021-06-28 MED ORDER — METOPROLOL TARTRATE 25 MG PO TABS
25.0000 mg | ORAL_TABLET | Freq: Every day | ORAL | Status: DC
  Administered 2021-06-28: 25 mg via ORAL
  Filled 2021-06-28: qty 1

## 2021-06-28 MED ORDER — THIAMINE HCL 100 MG/ML IJ SOLN: 100.0000 mg | Freq: Every day | INTRAMUSCULAR | Status: DC

## 2021-06-28 MED ORDER — ONDANSETRON HCL 4 MG/2ML IJ SOLN
4.0000 mg | Freq: Once | INTRAMUSCULAR | Status: AC
  Administered 2021-06-28: 4 mg via INTRAVENOUS
  Filled 2021-06-28: qty 2

## 2021-06-28 MED ORDER — LORAZEPAM 2 MG/ML IJ SOLN: 0.0000 mg | Freq: Four times a day (QID) | INTRAMUSCULAR | Status: DC

## 2021-06-28 MED ORDER — CHLORDIAZEPOXIDE HCL 25 MG PO CAPS: ORAL_CAPSULE | ORAL | 0 refills | Status: AC

## 2021-06-28 MED ORDER — LISINOPRIL 10 MG PO TABS
40.0000 mg | ORAL_TABLET | Freq: Every day | ORAL | Status: DC
  Administered 2021-06-28: 40 mg via ORAL
  Filled 2021-06-28: qty 4

## 2021-06-28 MED ORDER — THIAMINE HCL 100 MG PO TABS
100.0000 mg | ORAL_TABLET | Freq: Every day | ORAL | Status: DC
  Administered 2021-06-28: 100 mg via ORAL
  Filled 2021-06-28: qty 1

## 2021-06-28 MED ORDER — IOHEXOL 350 MG/ML SOLN
75.0000 mL | Freq: Once | INTRAVENOUS | Status: AC | PRN
  Administered 2021-06-28: 75 mL via INTRAVENOUS

## 2021-06-28 MED ORDER — MAGNESIUM SULFATE 4 GM/100ML IV SOLN
4.0000 g | Freq: Once | INTRAVENOUS | Status: AC
  Administered 2021-06-28: 4 g via INTRAVENOUS
  Filled 2021-06-28: qty 100

## 2021-06-28 MED ORDER — SUCRALFATE 1 G PO TABS
1.0000 g | ORAL_TABLET | Freq: Once | ORAL | Status: AC
  Administered 2021-06-28: 1 g via ORAL
  Filled 2021-06-28: qty 1

## 2021-06-28 MED ORDER — LORAZEPAM 2 MG PO TABS
0.0000 mg | ORAL_TABLET | Freq: Four times a day (QID) | ORAL | Status: DC
  Administered 2021-06-28: 2 mg via ORAL
  Filled 2021-06-28: qty 1

## 2021-06-28 MED ORDER — PANTOPRAZOLE SODIUM 40 MG PO TBEC
40.0000 mg | DELAYED_RELEASE_TABLET | Freq: Once | ORAL | Status: AC
  Administered 2021-06-28: 40 mg via ORAL
  Filled 2021-06-28: qty 1

## 2021-06-28 MED ORDER — PANTOPRAZOLE SODIUM 40 MG PO TBEC
40.0000 mg | DELAYED_RELEASE_TABLET | Freq: Every day | ORAL | 11 refills | Status: DC
  Filled 2021-06-28: qty 30, 30d supply, fill #0

## 2021-06-28 MED ORDER — LACTATED RINGERS IV BOLUS
1000.0000 mL | Freq: Once | INTRAVENOUS | Status: AC
  Administered 2021-06-28: 1000 mL via INTRAVENOUS

## 2021-06-28 MED ORDER — LORAZEPAM 2 MG PO TABS
0.0000 mg | ORAL_TABLET | Freq: Two times a day (BID) | ORAL | Status: DC
Start: 2021-07-01 — End: 2021-06-29

## 2021-06-28 NOTE — ED Provider Notes (Addendum)
[facility] Emergency Department Provider Note  ____________________________________________   Event Date/Time   First MD Initiated Contact with Patient 06/28/21 1600     (approximate)  I have reviewed the triage vital signs and the nursing notes.   HISTORY  Chief Complaint Abdominal Pain   HPI Miguel [name] is a 60 y.o. male with Paschal history of GERD, HTN, kidney stones, anxiety, asthma and alcohol abuse stating he has not had any alcohol in over 2 days and is status post cholecystectomy who presents for assessment of about 2 hours of some right upper quadrant abdominal pain associate with nausea and dry heaving.  Denies any diarrhea, constipation, urinary symptoms, cough, shortness of breath, chest pain, earache, sore throat rash or injuries.  States he has been trying to decrease his alcohol in the last couple days and just feels "funny".  He denies any other illicit drug use or significant NSAID use.         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    Right ankle pain 06/26/2020   Family history of breast cancer    Hx of colonic polyps    Hospital discharge follow-up 06/05/2020   Kidney function test abnormal 06/05/2020   Abdominal pain 04/12/2020   Hypertensive urgency 04/12/2020   Alcohol withdrawal ([place]) 03/04/2020   Right-sided chest wall pain 02/28/2020   Alcohol abuse with alcohol-induced mood disorder ([place]) 12/02/2019   Moderate episode of recurrent major depressive  disorder ([place])    Health care maintenance 10/26/2019   Right knee pain 10/26/2019   Generalized anxiety disorder 10/14/2019   MDD (major depressive disorder), recurrent severe, without psychosis ([place]) 07/27/2019   Social anxiety disorder 07/27/2019   Left-sided chest wall pain 67/61/9509   Alcoholic cirrhosis of liver without ascites ([place]) 06/14/2019   Alcohol abuse 06/14/2019   AKI (acute kidney injury) ([place]) 05/27/2019   Alcohol withdrawal syndrome without complication ([place]) 32/67/1245   Alcoholic intoxication without complication ([place])    Sepsis ([place]) 03/08/2019   Breast lump or mass 10/04/2018   Sepsis secondary to UTI ([place]) 09/14/2018   Asthma 07/07/2018   GERD (gastroesophageal reflux disease) 07/07/2018   OCD (obsessive compulsive disorder) 80/99/8338   Self-inflicted laceration of left wrist ([place]) 03/10/2018   Leg hematoma 12/25/2017   Suicide and self-inflicted injury by cutting and piercing instrument ([place]) 03/10/2017   Severe recurrent major 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 ([place]) 03/09/2017   Substance induced mood disorder ([place]) 08/15/2016   Involuntary commitment 08/15/2016   Alcohol use disorder, severe, dependence ([place]) 02/05/2016   Hypertension 12/05/2015   Tachycardia 12/05/2015   Gout 11/13/2015   Chronic back pain 05/02/2015    Past Surgical History:  Procedure Laterality Date   CHOLECYSTECTOMY  2012   COLONOSCOPY     COLONOSCOPY WITH PROPOFOL N/A 05/28/2020   Procedure: COLONOSCOPY WITH PROPOFOL;  Surgeon: [name], MD;  Location: [facility] ENDOSCOPY;  Service: Gastroenterology;  Laterality: N/A;   EXTRACORPOREAL SHOCK WAVE LITHOTRIPSY Left 01/12/2019   Procedure: EXTRACORPOREAL SHOCK WAVE LITHOTRIPSY (ESWL);  Surgeon: Billey Co, MD;  Location: [facility] ORS;  Service: Urology;  Laterality: Left;   UPPER GI ENDOSCOPY      Prior to Admission medications   Medication Sig Start Date End Date Taking? Authorizing Provider  chlordiazePOXIDE (LIBRIUM) 25 MG  capsule Take 2 capsules (50 mg total) by mouth every 6 (six) hours for 1 day, THEN 1 capsule (25 mg total) every 6 (six) hours for 1 day, THEN 1 capsule (25 mg total) every 12 (twelve) hours for 1 day, THEN 1 capsule (25 mg total) at bedtime for 1 day. 06/28/21 07/02/21 Yes [name], MD  pantoprazole (PROTONIX) 40 MG tablet Take 1 tablet (40 mg total) by mouth daily. 06/28/21 06/28/22 Yes [name], MD  ADVAIR DISKUS 100-50 MCG/ACT AEPB INHALE 1 PUFF 2 TIMES A DAY. RINSE MOUTH AND SPIT AFTER EACH USE. 06/12/20 06/19/21  Tawni Millers, MD  allopurinol (ZYLOPRIM) 300 MG tablet TAKE ONE TABLET BY MOUTH EVERY DAY 04/15/21 11/01/21  Iloabachie, Chioma E, NP  ascorbic acid (VITAMIN C) 500 MG tablet Take by mouth. 08/01/19   [provider]  cephALEXin (KEFLEX) 500 MG capsule Take 1 capsule (500 mg total) by mouth 2 (two) times daily for 5 days. 06/23/21 06/28/21  Merlyn Lot, MD  fluticasone (FLONASE) 50 MCG/ACT nasal spray SPRAY ONE SPRAY INTO BOTH NOSTRILS ONCE DAILY. 05/16/21 05/16/22  Lannie Fields, PA-C  folic acid (FOLVITE) 1 MG tablet TAKE ONE TABLET BY MOUTH EVERY DAY 05/21/21 05/21/22  Iloabachie, Chioma E, NP  hydrOXYzine (ATARAX/VISTARIL) 10 MG tablet Take 1 tablet (10 mg total) by mouth at bedtime as needed (sleep). 06/23/21   Merlyn Lot, MD  lisinopril (ZESTRIL) 40 MG tablet TAKE ONE TABLET BY MOUTH EVERY DAY 04/15/21 04/15/22  Iloabachie, Chioma E, NP  loratadine (CLARITIN) 10 MG tablet Take 10 mg by mouth daily.    [provider]  metoprolol tartrate (LOPRESSOR) 25 MG tablet Take 1 tablet (25 mg total) by mouth once daily. 05/21/21 06/25/21  Iloabachie, Chioma E, NP  mirtazapine (REMERON) 15 MG tablet Take 1 tablet (15 mg total) by mouth at bedtime. 05/21/21 06/25/21  Iloabachie, Chioma E, NP  pseudoephedrine (SUDAFED) 120 MG 12 hr tablet Take 120 mg by mouth daily.    [provider]  thiamine (VITAMIN B-1) 100 MG tablet TAKE ONE TABLET BY MOUTH EVERY DAY 10/22/20  10/22/21  Iloabachie, Chioma E, NP  albuterol (VENTOLIN HFA) 108 (90 Base) MCG/ACT inhaler Inhale 2 puffs into the lungs every 4 (four) hours as needed. 05/27/21 05/27/22  Iloabachie, Chioma E, NP    Allergies Patient has no known allergies.  Family History  Problem Relation Age of Onset   Alcohol abuse Father    Breast cancer Mother 53   Anxiety disorder Brother    Other Maternal Grandmother        unknown medical history   Other Maternal Grandfather        unknow medical history   Other Paternal Grandmother        unknown medical history   Other Paternal Grandfather        unknown medical history   Healthy Brother     Social History Social History   Tobacco Use   Smoking status: Former    Types: Cigarettes    Quit date: 03/19/1981    Years  since quitting: 40.3   Smokeless tobacco: Never  Vaping Use   Vaping Use: Never used  Substance Use Topics   Alcohol use: Yes    Alcohol/week: 24.0 standard drinks    Types: 24 Shots of liquor per week    Comment: LAst drink Friday 06/20/21   Drug use: No    Review of Systems  Review of Systems  Constitutional:  Negative for chills and fever.  HENT:  Negative for sore throat.   Eyes:  Negative for pain.  Respiratory:  Negative for cough and stridor.   Cardiovascular:  Negative for chest pain.  Gastrointestinal:  Positive for abdominal pain and nausea. Negative for vomiting.  Genitourinary:  Positive for flank pain (R).  Skin:  Negative for rash.  Neurological:  Negative for seizures, loss of consciousness and headaches.  Psychiatric/Behavioral:  Negative for suicidal ideas.   All other systems reviewed and are negative.    ____________________________________________   PHYSICAL EXAM:  VITAL SIGNS: ED Triage Vitals [06/28/21 1514]  Enc Vitals Group     BP (!) 189/107     Pulse Rate 97     Resp 18     Temp 98.8 F (37.1 C)     Temp Source Oral     SpO2 97 %     Weight      Height      Head Circumference      Peak  Flow      Pain Score 5     Pain Loc      Pain Edu?      Excl. in [place]?    Vitals:   06/28/21 1818 06/28/21 1830  BP: (!) 190/106 (!) 187/105  Pulse: 100 86  Resp: 20 20  Temp:    SpO2: 100% 100%   Physical Exam Vitals and nursing note reviewed.  Constitutional:      Appearance: He is well-developed. He is obese.  HENT:     Head: Normocephalic and atraumatic.     Right Ear: External ear normal.     Left Ear: External ear normal.  Eyes:     Conjunctiva/sclera: Conjunctivae normal.  Cardiovascular:     Rate and Rhythm: Normal rate and regular rhythm.     Heart sounds: No murmur heard. Pulmonary:     Effort: Pulmonary effort is normal. No respiratory distress.     Breath sounds: Normal breath sounds.  Abdominal:     Palpations: Abdomen is soft.     Tenderness: There is abdominal tenderness in the right upper quadrant and epigastric area. There is no right CVA tenderness or left CVA tenderness.  Musculoskeletal:     Cervical back: Neck supple.  Skin:    General: Skin is warm and dry.     Capillary Refill: Capillary refill takes less than 2 seconds.  Neurological:     Mental Status: He is alert and oriented to person, place, and time.     Motor: Tremor present.  Psychiatric:        Mood and Affect: Mood normal.     ____________________________________________   LABS (all labs ordered are listed, but only abnormal results are displayed)  Labs Reviewed  COMPREHENSIVE METABOLIC PANEL - Abnormal; Notable for the following components:      Result Value   CO2 19 (*)    Glucose, Bld 112 (*)    All other components within normal limits  CBC - Abnormal; Notable for the following components:   Hemoglobin 12.7 (*)  RDW 17.2 (*)    All other components within normal limits  URINALYSIS, COMPLETE (UACMP) WITH MICROSCOPIC - Abnormal; Notable for the following components:   Color, Urine YELLOW (*)    APPearance CLEAR (*)    All other components within normal limits   MAGNESIUM - Abnormal; Notable for the following components:   Magnesium 1.6 (*)    All other components within normal limits  LIPASE, BLOOD   ____________________________________________  EKG  Sinus rhythm with a ventricular rate of 86, left axis deviation, unremarkable intervals without clearance of acute ischemia or significant arrhythmia. ____________________________________________  RADIOLOGY  ED MD interpretation: CT abdomen pelvis remarkable for bilateral nonobstructing kidney stones stable from exam 5 days ago.  There is evidence of fatty liver and diverticulosis without evidence of diverticulitis.  No evidence of appendicitis, pancreatitis, or other acute abdominopelvic process.  Official radiology report(s): CT ABDOMEN PELVIS W CONTRAST  Result Date: 06/28/2021 CLINICAL DATA:  Acute abdominal pain for several days, initial encounter EXAM: CT ABDOMEN AND PELVIS WITH CONTRAST TECHNIQUE: Multidetector CT imaging of the abdomen and pelvis was performed using the standard protocol following bolus administration of intravenous contrast. CONTRAST:  37m OMNIPAQUE IOHEXOL 350 MG/ML SOLN COMPARISON:  06/23/2021 FINDINGS: Lower chest: No acute abnormality. Hepatobiliary: Diffuse decreased attenuation of the liver is noted consistent with fatty infiltration. The gallbladder has been surgically removed. Pancreas: Unremarkable. No pancreatic ductal dilatation or surrounding inflammatory changes. Spleen: Normal in size without focal abnormality. Adrenals/Urinary Tract: Adrenal glands are within normal limits. Kidneys again demonstrate bilateral renal calculi without obstructive change. The overall appearance is similar to that seen on the prior exam. No ureteral stones are seen. The bladder is partially distended. Stomach/Bowel: Scattered diverticular change of the sigmoid colon is noted. No diverticulitis is seen. The colon is otherwise decompressed without inflammatory change. The appendix is  within normal limits. Small bowel and stomach are unremarkable with the exception of a few duodenal diverticula adjacent to the head of the pancreas. Vascular/Lymphatic: Aortic atherosclerosis. No enlarged abdominal or pelvic lymph nodes. Reproductive: Prostate is unremarkable. Other: No abdominal wall hernia or abnormality. No abdominopelvic ascites. Musculoskeletal: Mild degenerative changes of lumbar spine are noted. IMPRESSION: Bilateral nonobstructing renal calculi stable from the exam 5 days previous. Fatty liver. Diverticulosis without diverticulitis. No acute abnormality is noted. Electronically Signed   By: MInez CatalinaM.D.   On: 06/28/2021 17:47    ____________________________________________   PROCEDURES  Procedure(s) performed (including Critical Care):  .1-3 Lead EKG Interpretation  Date/Time: 06/28/2021 7:54 PM Performed by: SLucrezia Starch MD Authorized by: SLucrezia Starch MD     Interpretation: non-specific     ECG rate assessment: normal     Rhythm: sinus rhythm     Ectopy: none     Conduction: normal     ____________________________________________   INITIAL IMPRESSION / ASSESSMENT AND PLAN / ED COURSE      Patient presents with above-stated history exam for assessment of some acute nontraumatic right upper quadrant abdominal pain rating on the right flank associate with some nausea and dry heaving.  On arrival he is hypertensive otherwise stable vital signs on room air.  Differential includes peptic ulcer disease and gastritis, duodenitis, pancreatitis, kidney stone, atypical presentation for ACS and metabolic derangements.  There is no lower abdominal tenderness urinary symptoms to suggest appendicitis or cystitis.  CT abdomen pelvis remarkable for bilateral nonobstructing kidney stones stable from exam 5 days ago.  There is evidence of fatty liver and diverticulosis without evidence  of diverticulitis.  No evidence of appendicitis, pancreatitis, or other acute  abdominopelvic process.  Lipase of 29 not consistent with acute pancreatitis.  CMP shows no significant electrolyte or metabolic derangements aside from bicarb 19.  CBC shows no leukocytosis or acute anemia.  Magnesium is 1.6.  UA is unremarkable for evidence of infection.  Overall suspect likely gastritis peptic ulcer disease with component of some mild alcohol withdrawal given elevated blood pressures and some tremulousness with patient stating he had a drink about 2 days ago.  Seems he has been tapering down for this.  He was given a GI cocktail as well as some fluids and magnesium.  On reassessment he states he feels much better.  Advised him importance of taking his blood pressure medicines and close follow-up with his PCP.  He states he wants to get help with his drinking and does not drinking anymore.  Will prescribe short course of Librium outpatient follow-up with [facility].Given stable vitals otherwise reassuring exam work-up I think is stable for discharge and close outpatient follow-up.  Discharged stable condition.  Strict return precautions advised and discussed.  ____________________________________________   FINAL CLINICAL IMPRESSION(S) / ED DIAGNOSES  Final diagnoses:  Right upper quadrant abdominal pain  Gastritis without bleeding, unspecified chronicity, unspecified gastritis type  Hypomagnesemia  Hypertension, unspecified type    Medications  LORazepam (ATIVAN) injection 0-4 mg ( Intravenous See Alternative 06/28/21 1646)    Or  LORazepam (ATIVAN) tablet 0-4 mg (2 mg Oral Given 06/28/21 1646)  LORazepam (ATIVAN) injection 0-4 mg (has no administration in time range)    Or  LORazepam (ATIVAN) tablet 0-4 mg (has no administration in time range)  thiamine tablet 100 mg (100 mg Oral Given 06/28/21 1646)    Or  thiamine (B-1) injection 100 mg ( Intravenous See Alternative 06/28/21 1646)  magnesium sulfate IVPB 4 g 100 mL (4 g Intravenous New Bag/Given 06/28/21 1831)  metoprolol  tartrate (LOPRESSOR) tablet 25 mg (25 mg Oral Given 06/28/21 1859)  lisinopril (ZESTRIL) tablet 40 mg (40 mg Oral Given 06/28/21 1858)  lactated ringers bolus 1,000 mL (1,000 mLs Intravenous New Bag/Given 06/28/21 1655)  morphine 4 MG/ML injection 6 mg (6 mg Intravenous Given 06/28/21 1645)  pantoprazole (PROTONIX) EC tablet 40 mg (40 mg Oral Given 06/28/21 1646)  sucralfate (CARAFATE) tablet 1 g (1 g Oral Given 06/28/21 1646)  alum & mag hydroxide-simeth (MAALOX/MYLANTA) 200-200-20 MG/5ML suspension 30 mL (30 mLs Oral Given 06/28/21 1645)  ondansetron (ZOFRAN) injection 4 mg (4 mg Intravenous Given 06/28/21 1645)  iohexol (OMNIPAQUE) 350 MG/ML injection 75 mL (75 mLs Intravenous Contrast Given 06/28/21 1726)     ED Discharge Orders          Ordered    pantoprazole (PROTONIX) 40 MG tablet  Daily        06/28/21 1920    chlordiazePOXIDE (LIBRIUM) 25 MG capsule        06/28/21 1921             Note:  This document was prepared using Dragon voice recognition software and may include unintentional dictation errors.    [name], MD 06/28/21 1926    [name], MD 06/28/21 Karl Bales

## 2021-06-28 NOTE — ED Notes (Signed)
Report received from Crystal, RN

## 2021-06-28 NOTE — ED Notes (Addendum)
RN to bedside to introduce self. Pt advised he is having abdominal pain x 3 days. He is trying to "wean himself off alcohol". He drinks multiple times a week. He feels "funny" inside. Pt was also here earlier in the week for the same symptoms. He advised they gave him something to calm his nerves and an antibiotic.

## 2021-06-28 NOTE — ED Triage Notes (Signed)
Pt comes via EMS with c/o nausea and right sided pain . Pt states this all started two hours ago. Pt states no drink since Thursday.

## 2021-06-28 NOTE — ED Notes (Signed)
ED Provider at bedside. 

## 2021-06-30 ENCOUNTER — Other Ambulatory Visit: Payer: Self-pay

## 2021-07-01 ENCOUNTER — Other Ambulatory Visit: Payer: Self-pay

## 2021-07-01 ENCOUNTER — Emergency Department
Admission: EM | Admit: 2021-07-01 | Discharge: 2021-07-02 | Disposition: A | Discharge status: Home / Self Care | Payer: Medicaid Other | Attending: Emergency Medicine | Admitting: Emergency Medicine

## 2021-07-01 ENCOUNTER — Encounter: Payer: Self-pay | Admitting: *Deleted

## 2021-07-01 DIAGNOSIS — Z87891 Personal history of nicotine dependence: Secondary | ICD-10-CM | POA: Insufficient documentation

## 2021-07-01 DIAGNOSIS — K029 Dental caries, unspecified: Secondary | ICD-10-CM | POA: Insufficient documentation

## 2021-07-01 DIAGNOSIS — Z79899 Other long term (current) drug therapy: Secondary | ICD-10-CM | POA: Insufficient documentation

## 2021-07-01 DIAGNOSIS — J45909 Unspecified asthma, uncomplicated: Secondary | ICD-10-CM | POA: Insufficient documentation

## 2021-07-01 DIAGNOSIS — I10 Essential (primary) hypertension: Secondary | ICD-10-CM | POA: Insufficient documentation

## 2021-07-01 NOTE — ED Triage Notes (Addendum)
Pt brought in via ems from home with right upper tooth pain.  Pt saw a dentist last week and the oral surgeon today.  Pt with increased pain and no appetite.  Pt sleepy.  Last drink was today.  Pt calm and cooperative. Pt taking abx.  Pt using oragel without relief.

## 2021-07-01 NOTE — ED Notes (Signed)
ED Provider at bedside. 

## 2021-07-02 MED ORDER — IBUPROFEN 800 MG PO TABS
800.0000 mg | ORAL_TABLET | Freq: Once | ORAL | Status: AC
  Administered 2021-07-02: 800 mg via ORAL
  Filled 2021-07-02: qty 1

## 2021-07-02 MED ORDER — HYDROCODONE-ACETAMINOPHEN 5-325 MG PO TABS: 1.0000 | ORAL_TABLET | Freq: Four times a day (QID) | ORAL | 0 refills | Status: DC | PRN

## 2021-07-02 MED ORDER — IBUPROFEN 800 MG PO TABS: 800.0000 mg | ORAL_TABLET | Freq: Three times a day (TID) | ORAL | 0 refills | Status: DC | PRN

## 2021-07-02 MED ORDER — HYDROCODONE-ACETAMINOPHEN 5-325 MG PO TABS
1.0000 | ORAL_TABLET | Freq: Once | ORAL | Status: AC
  Administered 2021-07-02: 1 via ORAL
  Filled 2021-07-02: qty 1

## 2021-07-02 NOTE — Discharge Instructions (Addendum)
Please follow-up with your dentist and oral surgeon for further management of your dental pain.   You are being provided a prescription for opiates (also known as narcotics) for pain control.  Opiates can be addictive and should only be used when absolutely necessary for pain control when other alternatives do not work.  We recommend you only use them for the recommended amount of time and only as prescribed.  Please do not take with other sedative medications or alcohol.  Please do not drive, operate machinery, make important decisions while taking opiates.  Please note that these medications can be addictive and have high abuse potential.  Patients can become addicted to narcotics after only taking them for a few days.  Please keep these medications locked away from children, teenagers or any family members with history of substance abuse.  Narcotic pain medicine may also make you constipated.  You may use over-the-counter medications such as MiraLAX, Colace to prevent constipation.  If you become constipated you may use over-the-counter enemas as needed.  Itching and nausea are common side effects of narcotic pain medication.  If you develop uncontrolled vomiting or a rash, please stop these medications.

## 2021-07-02 NOTE — ED Provider Notes (Signed)
[facility] Emergency Department Provider Note  ____________________________________________   Event Date/Time   First MD Initiated Contact with Patient 07/01/21 2353     (approximate)  I have reviewed the triage vital signs and the nursing notes.   HISTORY  Chief Complaint Dental Pain    HPI LESS WOOLSEY is a 60 y.o. male with history of hypertension, asthma, anxiety, alcohol abuse who presents to the emergency department with complaints of bilateral upper dental pain.  He has known dental caries and is being followed by dentist as well as an Chief Financial Officer.  States he is on antibiotics for the past week.  No fever or facial swelling but states that pain is worse with palpation of his teeth, opening his mouth and palpation of his maxillary areas bilaterally.  States his last drink was this morning.  States oral surgeon is planning on removing 5 of his teeth in September.        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    Right ankle pain 06/26/2020   Family history of breast cancer    Hx of colonic polyps    Hospital discharge follow-up 06/05/2020   Kidney function test abnormal 06/05/2020   Abdominal pain 04/12/2020   Hypertensive urgency 04/12/2020   Alcohol withdrawal ([place]) 03/04/2020   Right-sided chest wall pain 02/28/2020   Alcohol abuse with alcohol-induced mood disorder ([place]) 12/02/2019   Moderate episode of recurrent major depressive disorder ([place])    Health care maintenance  10/26/2019   Right knee pain 10/26/2019   Generalized anxiety disorder 10/14/2019   MDD (major depressive disorder), recurrent severe, 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/27/2019   Social anxiety disorder 07/27/2019   Left-sided chest wall pain 70/96/2836   Alcoholic cirrhosis of liver without ascites ([place]) 06/14/2019   Alcohol abuse 06/14/2019   AKI (acute kidney injury) ([place]) 05/27/2019   Alcohol withdrawal syndrome without complication ([place]) 62/94/7654   Alcoholic intoxication without complication ([place])    Sepsis ([place]) 03/08/2019   Breast lump or mass 10/04/2018   Sepsis secondary to UTI ([place]) 09/14/2018   Asthma 07/07/2018   GERD (gastroesophageal reflux disease) 07/07/2018   OCD (obsessive compulsive disorder) 65/01/5464   Self-inflicted laceration of left wrist ([place]) 03/10/2018   Leg hematoma 12/25/2017   Suicide and self-inflicted injury by cutting and piercing instrument ([place]) 03/10/2017   Severe recurrent major 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 ([place]) 03/09/2017   Substance induced mood disorder ([place]) 08/15/2016   Involuntary commitment 08/15/2016   Alcohol use disorder, severe, dependence ([place]) 02/05/2016   Hypertension 12/05/2015   Tachycardia 12/05/2015   Gout 11/13/2015   Chronic back pain 05/02/2015    Past Surgical History:  Procedure Laterality Date   CHOLECYSTECTOMY  2012   COLONOSCOPY     COLONOSCOPY WITH PROPOFOL N/A 05/28/2020   Procedure: COLONOSCOPY WITH PROPOFOL;  Surgeon: [name], MD;  Location: [facility]  ENDOSCOPY;  Service: Gastroenterology;  Laterality: N/A;   EXTRACORPOREAL SHOCK WAVE LITHOTRIPSY Left 01/12/2019   Procedure: EXTRACORPOREAL SHOCK WAVE LITHOTRIPSY (ESWL);  Surgeon: Billey Co, MD;  Location: [facility] ORS;  Service: Urology;  Laterality: Left;   UPPER GI ENDOSCOPY      Prior to Admission medications   Medication Sig Start Date End Date Taking? Authorizing Provider  HYDROcodone-acetaminophen (NORCO/VICODIN) 5-325 MG tablet Take 1 tablet by  mouth every 6 (six) hours as needed. 07/02/21  Yes Bethann Qualley, Delice Bison, DO  ibuprofen (ADVIL) 800 MG tablet Take 1 tablet (800 mg total) by mouth every 8 (eight) hours as needed for mild pain. 07/02/21  Yes Breyonna Nault N, DO  ADVAIR DISKUS 100-50 MCG/ACT AEPB INHALE 1 PUFF 2 TIMES A DAY. RINSE MOUTH AND SPIT AFTER EACH USE. 06/12/20 06/19/21  Tawni Millers, MD  allopurinol (ZYLOPRIM) 300 MG tablet TAKE ONE TABLET BY MOUTH EVERY DAY 04/15/21 11/01/21  Iloabachie, Chioma E, NP  ascorbic acid (VITAMIN C) 500 MG tablet Take by mouth. 08/01/19   [provider]  chlordiazePOXIDE (LIBRIUM) 25 MG capsule Take 2 capsules (50 mg total) by mouth every 6 (six) hours for 1 day, THEN 1 capsule (25 mg total) every 6 (six) hours for 1 day, THEN 1 capsule (25 mg total) every 12 (twelve) hours for 1 day, THEN 1 capsule (25 mg total) at bedtime for 1 day. 06/28/21 07/02/21  [name], MD  fluticasone (FLONASE) 50 MCG/ACT nasal spray SPRAY ONE SPRAY INTO BOTH NOSTRILS ONCE DAILY. 05/16/21 05/16/22  Lannie Fields, PA-C  folic acid (FOLVITE) 1 MG tablet TAKE ONE TABLET BY MOUTH EVERY DAY 05/21/21 05/21/22  Iloabachie, Chioma E, NP  hydrOXYzine (ATARAX/VISTARIL) 10 MG tablet Take 1 tablet (10 mg total) by mouth at bedtime as needed (sleep). 06/23/21   Merlyn Lot, MD  lisinopril (ZESTRIL) 40 MG tablet TAKE ONE TABLET BY MOUTH EVERY DAY 04/15/21 04/15/22  Iloabachie, Chioma E, NP  loratadine (CLARITIN) 10 MG tablet Take 10 mg by mouth daily.    [provider]  metoprolol tartrate (LOPRESSOR) 25 MG tablet Take 1 tablet (25 mg total) by mouth once daily. 05/21/21 06/25/21  Iloabachie, Chioma E, NP  mirtazapine (REMERON) 15 MG tablet Take 1 tablet (15 mg total) by mouth at bedtime. 05/21/21 06/25/21  Iloabachie, Chioma E, NP  pantoprazole (PROTONIX) 40 MG tablet Take 1 tablet (40 mg total) by mouth once daily. 06/28/21 06/28/22  [name], MD  pseudoephedrine (SUDAFED) 120 MG 12 hr tablet Take 120 mg by mouth daily.     [provider]  thiamine (VITAMIN B-1) 100 MG tablet TAKE ONE TABLET BY MOUTH EVERY DAY 10/22/20 10/22/21  Iloabachie, Chioma E, NP  albuterol (VENTOLIN HFA) 108 (90 Base) MCG/ACT inhaler Inhale 2 puffs into the lungs every 4 (four) hours as needed. 05/27/21 05/27/22  Iloabachie, Chioma E, NP    Allergies Patient has no known allergies.  Family History  Problem Relation Age of Onset   Alcohol abuse Father    Breast cancer Mother 38   Anxiety disorder Brother    Other Maternal Grandmother        unknown medical history   Other Maternal Grandfather        unknow medical history   Other Paternal Grandmother        unknown medical history   Other Paternal Grandfather        unknown medical history   Healthy Brother     Social History  Social History   Tobacco Use   Smoking status: Former    Types: Cigarettes    Quit date: 03/19/1981    Years since quitting: 40.3   Smokeless tobacco: Never  Vaping Use   Vaping Use: Never used  Substance Use Topics   Alcohol use: Yes    Alcohol/week: 24.0 standard drinks    Types: 24 Shots of liquor per week    Comment: LAst drink Friday 06/20/21   Drug use: No    Review of Systems Constitutional: No fever. Eyes: No visual changes. ENT: No sore throat. Cardiovascular: Denies chest pain. Respiratory: Denies shortness of breath. Gastrointestinal: No nausea, vomiting, diarrhea. Genitourinary: Negative for dysuria. Musculoskeletal: Negative for back pain. Skin: Negative for rash. Neurological: Negative for focal weakness or numbness.  ____________________________________________   PHYSICAL EXAM:  VITAL SIGNS: ED Triage Vitals  Enc Vitals Group     BP 07/01/21 2158 132/89     Pulse Rate 07/01/21 2158 85     Resp 07/01/21 2158 18     Temp 07/01/21 2158 98.8 F (37.1 C)     Temp Source 07/01/21 2158 Oral     SpO2 07/01/21 2158 99 %     Weight 07/01/21 2155 200 lb (90.7 kg)     Height 07/01/21 2155 6' (1.829 m)     Head  Circumference --      Peak Flow --      Pain Score 07/01/21 2154 8     Pain Loc --      Pain Edu? --      Excl. in [place]? --    CONSTITUTIONAL: Alert and oriented and responds appropriately to questions.  Chronically ill-appearing.  Afebrile.  Nontoxic.  Does not appear intoxicated. HEAD: Normocephalic EYES: Conjunctivae clear, pupils appear equal, EOM appear intact ENT: normal nose; moist mucous membranes, multiple missing teeth, multiple dental caries, mild gingival swelling around his upper teeth without sign of drainable abscess, tongue sits flat in the bottom of the mouth, no trismus or drooling, normal phonation, no tonsillar hypertrophy or exudate, no uvular deviation or swelling, no Ludwig's angina NECK: Supple, normal ROM, no lymphadenopathy CARD: RRR; S1 and S2 appreciated; no murmurs, no clicks, no rubs, no gallops RESP: Normal chest excursion without splinting or tachypnea; breath sounds clear and equal bilaterally; no wheezes, no rhonchi, no rales, no hypoxia or respiratory distress, speaking full sentences ABD/GI: Normal bowel sounds; non-distended; soft, non-tender, no rebound, no guarding, no peritoneal signs, no hepatosplenomegaly BACK: The back appears normal EXT: Normal ROM in all joints; no deformity noted, no edema; no cyanosis SKIN: Normal color for age and race; warm; no rash on exposed skin NEURO: Moves all extremities equally PSYCH: The patient's mood and manner are appropriate.  ____________________________________________   LABS (all labs ordered are listed, but only abnormal results are displayed)  Labs Reviewed - No data to display ____________________________________________  EKG   ____________________________________________  RADIOLOGY I, Kenzleigh Sedam, personally viewed and evaluated these images (plain radiographs) as part of my medical decision making, as well as reviewing the written report by the radiologist.  ED MD interpretation:    Official  radiology report(s): No results found.  ____________________________________________   PROCEDURES  Procedure(s) performed (including Critical Care):  Procedures    ____________________________________________   INITIAL IMPRESSION / ASSESSMENT AND PLAN / ED COURSE  As part of my medical decision making, I reviewed the following data within the [place] notes reviewed and incorporated, Old chart reviewed, Notes from  prior ED visits, and [place] Controlled Substance Database         Patient here with dental pain.  He has multiple dental caries but no sign of dental abscess.  Has plans to have his remaining 4 upper teeth removed as well as one of his lower teeth in September.  He is already on antibiotics.  No sign of facial swelling, cellulitis, Ludwig's today.  He does have history of alcohol abuse.  Does not appear intoxicated currently.  Will discharge with very brief course of pain medication given he is stating Tylenol and ibuprofen over-the-counter not helping with his pain.  We did discuss at length that he should not take these medications if he is drinking alcohol.  He verbalized understanding.  We will give pain medication here prior to discharge.  At this time, I do not feel there is any life-threatening condition present. I have reviewed, interpreted and discussed all results (EKG, imaging, lab, urine as appropriate) and exam findings with patient/family. I have reviewed nursing notes and appropriate previous records.  I feel the patient is safe to be discharged home without further emergent workup and can continue workup as an outpatient as needed. Discussed usual and customary return precautions. Patient/family verbalize understanding and are comfortable with this plan.  Outpatient follow-up has been provided as needed. All questions have been answered.  ____________________________________________   FINAL CLINICAL IMPRESSION(S) / ED DIAGNOSES  Final  diagnoses:  Pain due to dental caries     ED Discharge Orders          Ordered    HYDROcodone-acetaminophen (NORCO/VICODIN) 5-325 MG tablet  Every 6 hours PRN        07/02/21 0007    ibuprofen (ADVIL) 800 MG tablet  Every 8 hours PRN        07/02/21 0007            *Please note:  JUSTUN ANAYA was evaluated in Emergency Department on 07/02/2021 for the symptoms described in the history of present illness. He was evaluated in the context of the global COVID-19 pandemic, which necessitated consideration that the patient might be at risk for infection with the SARS-CoV-2 virus that causes COVID-19. Institutional protocols and algorithms that pertain to the evaluation of patients at risk for COVID-19 are in a state of rapid change based on information released by regulatory bodies including the CDC and federal and state organizations. These policies and algorithms were followed during the patient's care in the ED.  Some ED evaluations and interventions may be delayed as a result of limited staffing during and the pandemic.*   Note:  This document was prepared using Dragon voice recognition software and may include unintentional dictation errors.    , Delice Bison, DO 07/02/21 [phone]

## 2021-07-06 ENCOUNTER — Emergency Department
Admission: EM | Admit: 2021-07-06 | Discharge: 2021-07-07 | Disposition: A | Discharge status: Home / Self Care | Payer: Self-pay | Attending: Emergency Medicine | Admitting: Emergency Medicine

## 2021-07-06 DIAGNOSIS — Z7951 Long term (current) use of inhaled steroids: Secondary | ICD-10-CM | POA: Insufficient documentation

## 2021-07-06 DIAGNOSIS — Z79899 Other long term (current) drug therapy: Secondary | ICD-10-CM | POA: Insufficient documentation

## 2021-07-06 DIAGNOSIS — Z87891 Personal history of nicotine dependence: Secondary | ICD-10-CM | POA: Insufficient documentation

## 2021-07-06 DIAGNOSIS — I10 Essential (primary) hypertension: Secondary | ICD-10-CM | POA: Insufficient documentation

## 2021-07-06 DIAGNOSIS — F10120 Alcohol abuse with intoxication, uncomplicated: Secondary | ICD-10-CM | POA: Insufficient documentation

## 2021-07-06 DIAGNOSIS — F10920 Alcohol use, unspecified with intoxication, uncomplicated: Secondary | ICD-10-CM

## 2021-07-06 DIAGNOSIS — R079 Chest pain, unspecified: Secondary | ICD-10-CM | POA: Insufficient documentation

## 2021-07-06 DIAGNOSIS — W19XXXA Unspecified fall, initial encounter: Secondary | ICD-10-CM | POA: Insufficient documentation

## 2021-07-06 DIAGNOSIS — R109 Unspecified abdominal pain: Secondary | ICD-10-CM | POA: Insufficient documentation

## 2021-07-06 DIAGNOSIS — J45909 Unspecified asthma, uncomplicated: Secondary | ICD-10-CM | POA: Insufficient documentation

## 2021-07-06 DIAGNOSIS — S20311A Abrasion of right front wall of thorax, initial encounter: Secondary | ICD-10-CM | POA: Insufficient documentation

## 2021-07-06 LAB — CBC WITH DIFFERENTIAL/PLATELET
Abs Immature Granulocytes: 0.03 10*3/uL (ref 0.00–0.07)
Basophils Absolute: 0.1 10*3/uL (ref 0.0–0.1)
Basophils Relative: 1 %
Eosinophils Absolute: 0.4 10*3/uL (ref 0.0–0.5)
Eosinophils Relative: 5 %
HCT: 39.8 % (ref 39.0–52.0)
Hemoglobin: 12.8 g/dL — ABNORMAL LOW (ref 13.0–17.0)
Immature Granulocytes: 0 %
Lymphocytes Relative: 21 %
Lymphs Abs: 1.5 10*3/uL (ref 0.7–4.0)
MCH: 27.4 pg (ref 26.0–34.0)
MCHC: 32.2 g/dL (ref 30.0–36.0)
MCV: 85 fL (ref 80.0–100.0)
Monocytes Absolute: 0.6 10*3/uL (ref 0.1–1.0)
Monocytes Relative: 9 %
Neutro Abs: 4.6 10*3/uL (ref 1.7–7.7)
Neutrophils Relative %: 64 %
Platelets: 195 10*3/uL (ref 150–400)
RBC: 4.68 MIL/uL (ref 4.22–5.81)
RDW: 17.7 % — ABNORMAL HIGH (ref 11.5–15.5)
WBC: 7.2 10*3/uL (ref 4.0–10.5)
nRBC: 0 % (ref 0.0–0.2)

## 2021-07-06 NOTE — ED Triage Notes (Signed)
Pt BIB EMS for alcohol intoxication. Per EMS, wife called because she could not find pt. [place] [employer] [place]. Pt has some abrasions on back and arm/ Pt does not have any complaints at this time.

## 2021-07-06 NOTE — ED Provider Notes (Signed)
[facility] Emergency Department Provider Note  ____________________________________________   I have reviewed the triage vital signs and the nursing notes.   HISTORY  Chief Complaint Alcohol Intoxication   History limited by: Intoxication   HPI Miguel [name] is a 60 y.o. male who presents to the emergency department today with primary concern for pain after a fall. The patient states that he fell off the ramp going up his porch. He does appear intoxicated so cannot give a good history. Is complaining of pain to the right side of his chest and abdomen. States that he was crawling on the ground.  Records reviewed. Per medical record review patient has a history of alcohol abuse.   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    Right ankle pain 06/26/2020   Family history of breast cancer    Hx of colonic polyps    Hospital discharge follow-up 06/05/2020   Kidney function test abnormal 06/05/2020   Abdominal pain 04/12/2020   Hypertensive urgency 04/12/2020   Alcohol withdrawal ([place]) 03/04/2020   Right-sided chest wall pain 02/28/2020   Alcohol abuse with alcohol-induced mood disorder ([place]) 12/02/2019   Moderate episode of recurrent major depressive disorder ([place])    Health care maintenance 10/26/2019   Right knee pain 10/26/2019   Generalized anxiety disorder 10/14/2019   MDD (major depressive disorder), recurrent severe, 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/27/2019   Social anxiety disorder  07/27/2019   Left-sided chest wall pain 45/36/4680   Alcoholic cirrhosis of liver without ascites ([place]) 06/14/2019   Alcohol abuse 06/14/2019   AKI (acute kidney injury) ([place]) 05/27/2019   Alcohol withdrawal syndrome without complication ([place]) 32/10/2481   Alcoholic intoxication without complication ([place])    Sepsis ([place]) 03/08/2019   Breast lump or mass 10/04/2018   Sepsis secondary to UTI ([place]) 09/14/2018   Asthma 07/07/2018   GERD (gastroesophageal reflux disease) 07/07/2018   OCD (obsessive compulsive disorder) 50/01/7047   Self-inflicted laceration of left wrist ([place]) 03/10/2018   Leg hematoma 12/25/2017   Suicide and self-inflicted injury by cutting and piercing instrument ([place]) 03/10/2017   Severe recurrent major 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 ([place]) 03/09/2017   Substance induced mood disorder ([place]) 08/15/2016   Involuntary commitment 08/15/2016   Alcohol use disorder, severe, dependence ([place]) 02/05/2016   Hypertension 12/05/2015   Tachycardia 12/05/2015   Gout 11/13/2015   Chronic back pain 05/02/2015    Past Surgical History:  Procedure Laterality Date   CHOLECYSTECTOMY  2012   COLONOSCOPY     COLONOSCOPY WITH PROPOFOL N/A 05/28/2020   Procedure: COLONOSCOPY WITH PROPOFOL;  Surgeon: [name], MD;  Location: [facility] ENDOSCOPY;  Service: Gastroenterology;  Laterality: N/A;   EXTRACORPOREAL SHOCK WAVE LITHOTRIPSY Left 01/12/2019   Procedure: EXTRACORPOREAL SHOCK WAVE LITHOTRIPSY (ESWL);  Surgeon: Billey Co, MD;  Location: [facility] ORS;  Service: Urology;  Laterality: Left;   UPPER GI ENDOSCOPY  Prior to Admission medications   Medication Sig Start Date End Date Taking? Authorizing Provider  ADVAIR DISKUS 100-50 MCG/ACT AEPB INHALE 1 PUFF 2 TIMES A DAY. RINSE MOUTH AND SPIT AFTER EACH USE. 06/12/20 06/19/21  Tawni Millers, MD  allopurinol (ZYLOPRIM) 300 MG tablet TAKE ONE TABLET BY MOUTH EVERY DAY 04/15/21 11/01/21  Iloabachie, Chioma E, NP  ascorbic acid  (VITAMIN C) 500 MG tablet Take by mouth. 08/01/19   [provider]  fluticasone (FLONASE) 50 MCG/ACT nasal spray SPRAY ONE SPRAY INTO BOTH NOSTRILS ONCE DAILY. 05/16/21 05/16/22  Lannie Fields, PA-C  folic acid (FOLVITE) 1 MG tablet TAKE ONE TABLET BY MOUTH EVERY DAY 05/21/21 05/21/22  Iloabachie, Chioma E, NP  HYDROcodone-acetaminophen (NORCO/VICODIN) 5-325 MG tablet Take 1 tablet by mouth every 6 (six) hours as needed. 07/02/21   Ward, Delice Bison, DO  hydrOXYzine (ATARAX/VISTARIL) 10 MG tablet Take 1 tablet (10 mg total) by mouth at bedtime as needed (sleep). 06/23/21   Merlyn Lot, MD  ibuprofen (ADVIL) 800 MG tablet Take 1 tablet (800 mg total) by mouth every 8 (eight) hours as needed for mild pain. 07/02/21   Ward, Delice Bison, DO  lisinopril (ZESTRIL) 40 MG tablet TAKE ONE TABLET BY MOUTH EVERY DAY 04/15/21 04/15/22  Iloabachie, Chioma E, NP  loratadine (CLARITIN) 10 MG tablet Take 10 mg by mouth daily.    [provider]  metoprolol tartrate (LOPRESSOR) 25 MG tablet Take 1 tablet (25 mg total) by mouth once daily. 05/21/21 06/25/21  Iloabachie, Chioma E, NP  mirtazapine (REMERON) 15 MG tablet Take 1 tablet (15 mg total) by mouth at bedtime. 05/21/21 06/25/21  Iloabachie, Chioma E, NP  pantoprazole (PROTONIX) 40 MG tablet Take 1 tablet (40 mg total) by mouth once daily. 06/28/21 06/28/22  [name], MD  pseudoephedrine (SUDAFED) 120 MG 12 hr tablet Take 120 mg by mouth daily.    [provider]  thiamine (VITAMIN B-1) 100 MG tablet TAKE ONE TABLET BY MOUTH EVERY DAY 10/22/20 10/22/21  Iloabachie, Chioma E, NP  albuterol (VENTOLIN HFA) 108 (90 Base) MCG/ACT inhaler Inhale 2 puffs into the lungs every 4 (four) hours as needed. 05/27/21 05/27/22  Iloabachie, Chioma E, NP    Allergies Patient has no known allergies.  Family History  Problem Relation Age of Onset   Alcohol abuse Father    Breast cancer Mother 85   Anxiety disorder Brother    Other Maternal Grandmother         unknown medical history   Other Maternal Grandfather        unknow medical history   Other Paternal Grandmother        unknown medical history   Other Paternal Grandfather        unknown medical history   Healthy Brother     Social History Social History   Tobacco Use   Smoking status: Former    Types: Cigarettes    Quit date: 03/19/1981    Years since quitting: 40.3   Smokeless tobacco: Never  Vaping Use   Vaping Use: Never used  Substance Use Topics   Alcohol use: Yes    Alcohol/week: 24.0 standard drinks    Types: 24 Shots of liquor per week    Comment: LAst drink Friday 06/20/21   Drug use: No    Review of Systems Constitutional: No fever/chills Eyes: No visual changes. ENT: No sore throat. Cardiovascular: Positive for right sided chest pain. Respiratory: Denies shortness of breath. Gastrointestinal: Positive for  right sided abdominal pain. Genitourinary: Negative for dysuria. Musculoskeletal: Negative for back pain. Skin: Negative for rash. Neurological: Negative for headaches, focal weakness or numbness.  ____________________________________________   PHYSICAL EXAM:  VITAL SIGNS: ED Triage Vitals  Enc Vitals Group     BP 07/06/21 2247 136/85     Pulse Rate 07/06/21 2247 95     Resp 07/06/21 2247 20     Temp 07/06/21 2247 97.8 F (36.6 C)     Temp Source 07/06/21 2247 Oral     SpO2 07/06/21 2247 100 %     Weight --      Height --      Head Circumference --      Peak Flow --      Pain Score 07/06/21 2248 0   Constitutional: Awake and alert. Appears intoxicated.  Eyes: Conjunctivae are normal.  ENT      Head: Normocephalic and atraumatic.      Nose: No congestion/rhinnorhea.      Mouth/Throat: Mucous membranes are moist.      Neck: No stridor. Hematological/Lymphatic/Immunilogical: No cervical lymphadenopathy. Cardiovascular: Normal rate, regular rhythm.  No murmurs, rubs, or gallops.  Respiratory: Normal respiratory effort without tachypnea nor  retractions. Breath sounds are clear and equal bilaterally. No wheezes/rales/rhonchi. Gastrointestinal: Soft and non tender. No rebound. No guarding.  Genitourinary: Deferred Musculoskeletal: Normal range of motion in all extremities. No lower extremity edema. Neurologic:  Appears intoxicated. Is able to move all extremities.  Skin:  Abrasions and scratches noted to right thorax.  ____________________________________________    LABS (pertinent positives/negatives)  BMP wnl except glu 113, cr 1.33 CBC wbc 7.2, hgb 12.8, plt 195  ____________________________________________   EKG  None  ____________________________________________    RADIOLOGY  CT head/cervical spine No acute osseus injury, no intracranial abnormality  CT chest/abd/pel No acute traumatic injury  ____________________________________________   PROCEDURES  Procedures  ____________________________________________   INITIAL IMPRESSION / ASSESSMENT AND PLAN / ED COURSE  Pertinent labs & imaging results that were available during my care of the patient were reviewed by me and considered in my medical decision making (see chart for details).   Patient presents to the emergency department today intoxicated.  Is complaining of some right sided body pain.  He does have abrasions on the right side and states that he fell.  Given intoxication and evidence of some injury did get trauma scans to evaluate for significant underlying injury.  Fortunately these were negative.  Will plan on discharging home.  ____________________________________________   FINAL CLINICAL IMPRESSION(S) / ED DIAGNOSES  Final diagnoses:  Abdominal pain  Alcoholic intoxication without complication [facility])     Note: This dictation was prepared with Dragon dictation. Any transcriptional errors that result from this process are unintentional     Nance Pear, MD 07/07/21 [phone]

## 2021-07-07 ENCOUNTER — Emergency Department: Payer: Self-pay

## 2021-07-07 LAB — BASIC METABOLIC PANEL
Anion gap: 6 (ref 5–15)
BUN: 13 mg/dL (ref 8–23)
CO2: 23 mmol/L (ref 22–32)
Calcium: 8.9 mg/dL (ref 8.9–10.3)
Chloride: 109 mmol/L (ref 98–111)
Creatinine, Ser: 1.33 mg/dL — ABNORMAL HIGH (ref 0.61–1.24)
GFR, Estimated: >60 mL/min (ref >60)
Glucose, Bld: 113 mg/dL — ABNORMAL HIGH (ref 70–99)
Potassium: 3.6 mmol/L (ref 3.5–5.1)
Sodium: 138 mmol/L (ref 135–145)

## 2021-07-07 MED ORDER — IOHEXOL 350 MG/ML SOLN
100.0000 mL | Freq: Once | INTRAVENOUS | Status: AC | PRN
  Administered 2021-07-07: 100 mL via INTRAVENOUS

## 2021-07-07 MED ORDER — IBUPROFEN 400 MG PO TABS
400.0000 mg | ORAL_TABLET | Freq: Once | ORAL | Status: AC
  Administered 2021-07-07: 400 mg via ORAL
  Filled 2021-07-07: qty 1

## 2021-07-07 NOTE — Discharge Instructions (Addendum)
Please seek medical attention for any high fevers, chest pain, shortness of breath, change in behavior, persistent vomiting, bloody stool or any other new or concerning symptoms.  

## 2021-07-08 ENCOUNTER — Other Ambulatory Visit: Payer: Self-pay

## 2021-07-08 ENCOUNTER — Telehealth: Payer: Self-pay | Admitting: Pharmacist

## 2021-07-08 NOTE — Telephone Encounter (Signed)
07/08/2021 4:05:57 PM - [facility] RTS not picked up  -- Elmer Picker - Tuesday, July 08, 2021 4:04 PM --Received invoice dated 05/09/2021 where we received (3) [employer] HFA--patient has been contacted several times to pick up//patient has not picked up-RTS for non pickup. Putting not in [facility] for [facility] provider to see.

## 2021-07-17 ENCOUNTER — Other Ambulatory Visit: Payer: Self-pay

## 2021-07-19 IMAGING — CT CT CERVICAL SPINE W/O CM
3 of 4 series · 9 of 33 positions shown, 10 images · non-contrast
Comparison: None.

CLINICAL DATA: Near syncopal episode

EXAM:
CT HEAD WITHOUT CONTRAST
TECHNIQUE: Contiguous axial images were obtained from the base of the skull
through the vertex without intravenous contrast.

[Series 6: sagittal bone · sagittal · 0.21mm/px · 5 of 59 slices shown]
[im 20/59  bone]
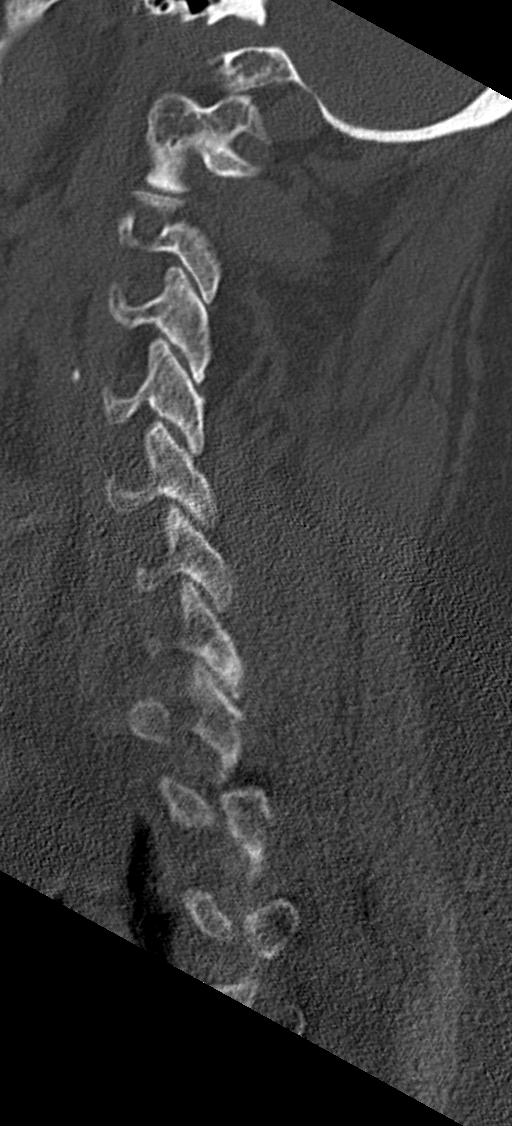
[im 25/59  bone]
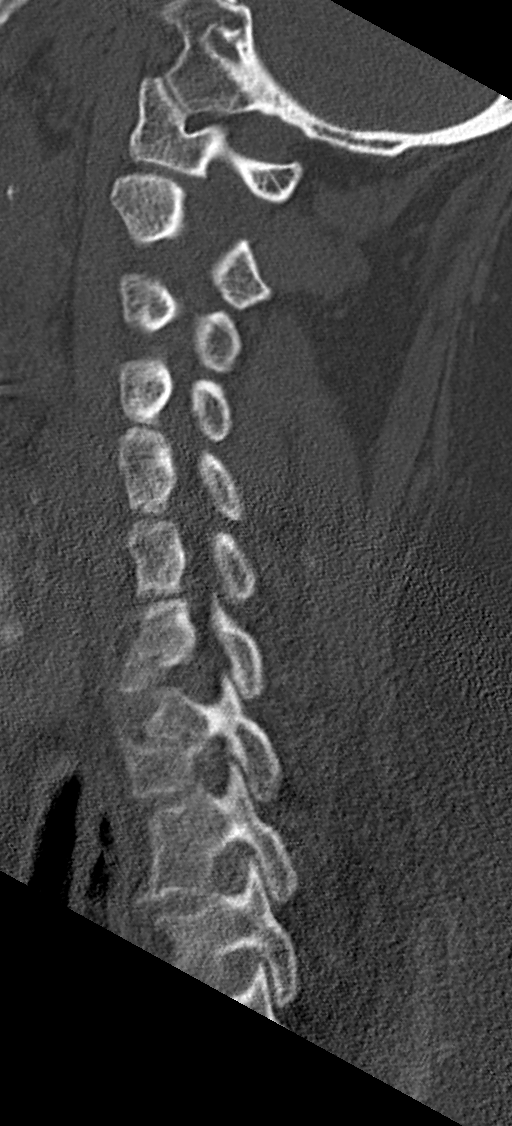
[im 30/59  bone]
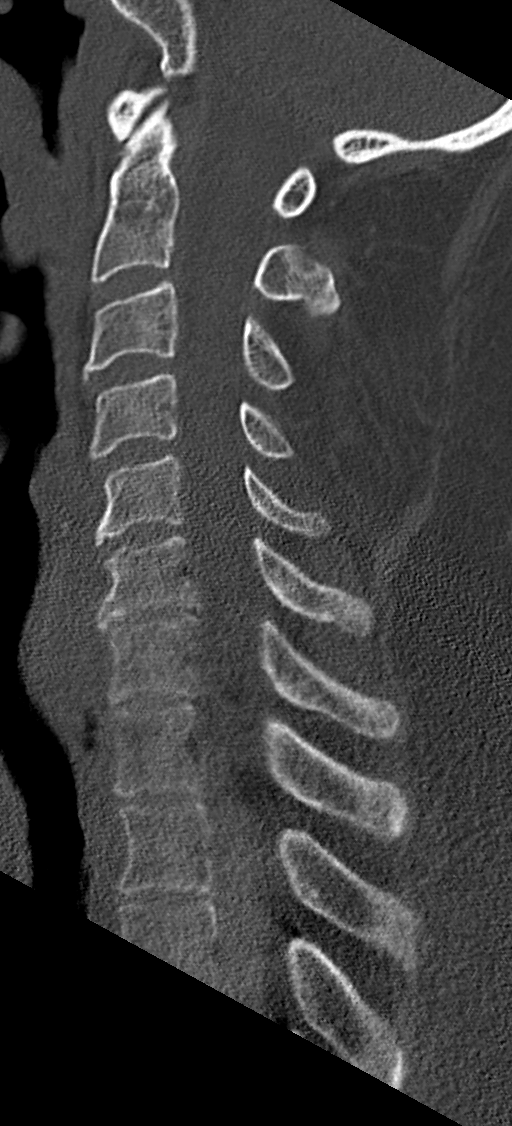
[im 34/59  bone]
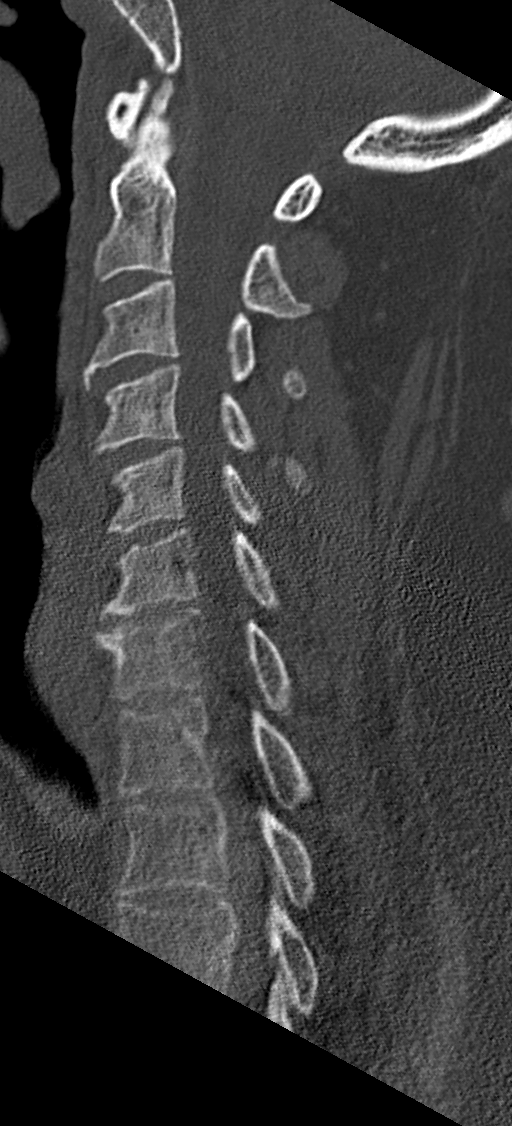
[im 39/59  bone]
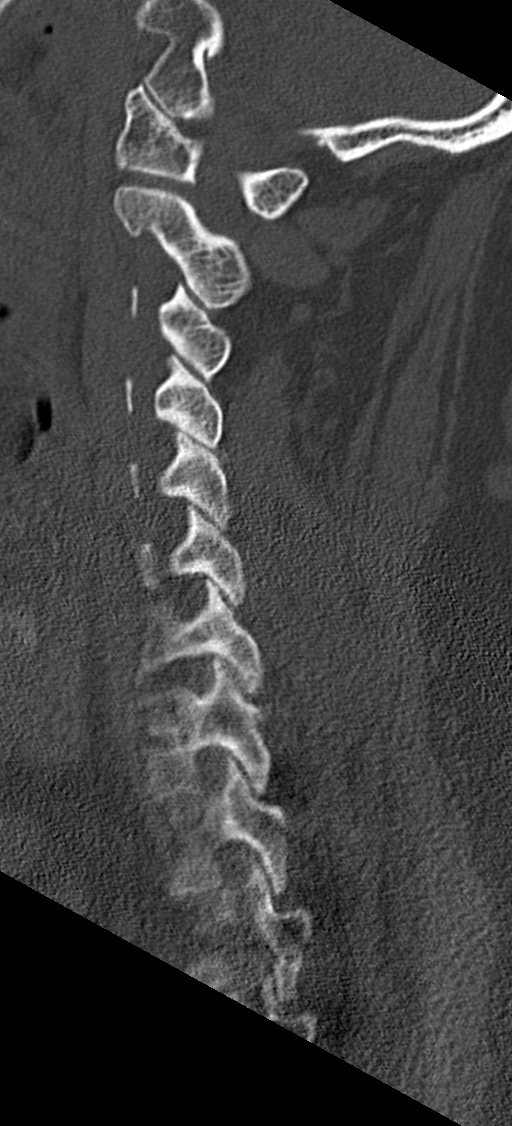

[Series 7: coronal bone · coronal · 0.23mm/px · 3 of 46 slices shown]
[im 10/46  bone]
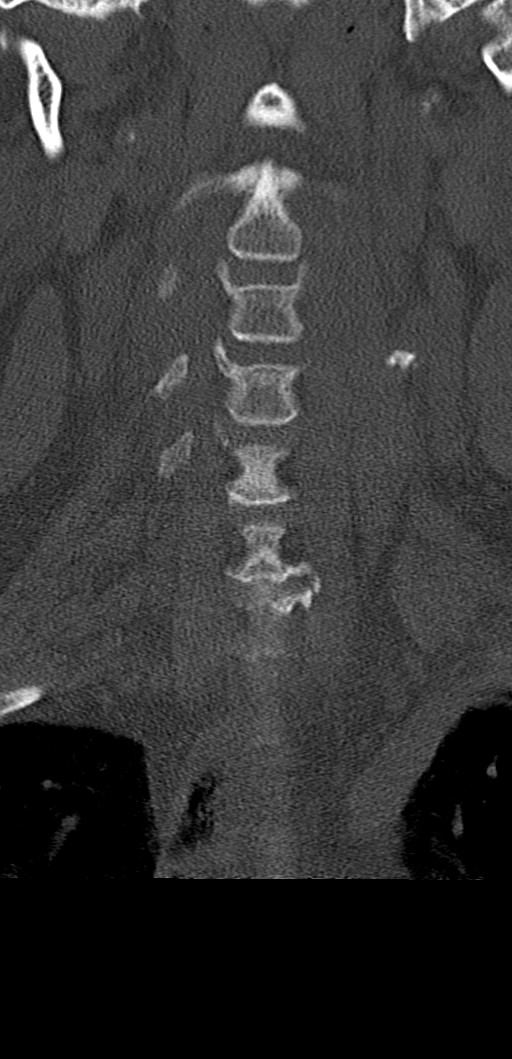
[im 19/46  bone]
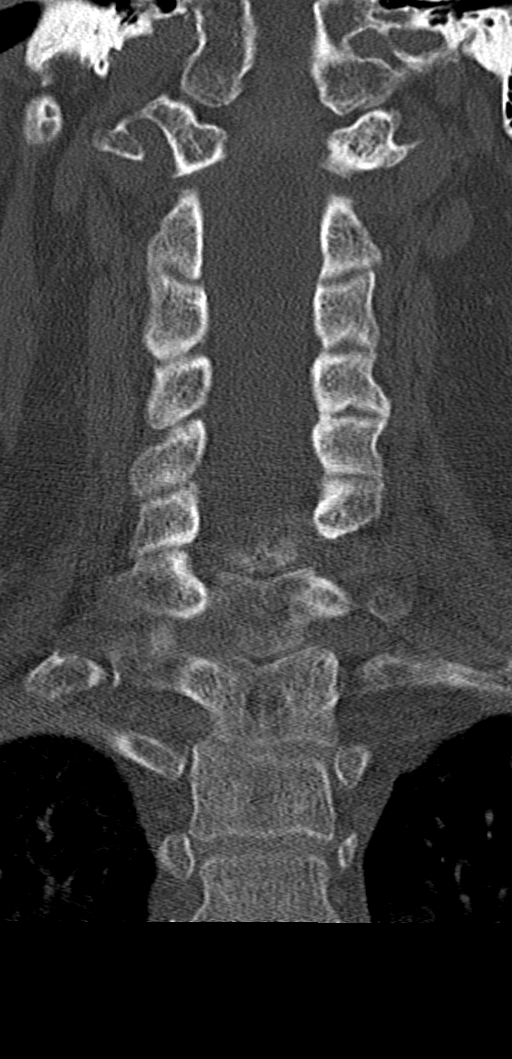
[im 28/46  bone]
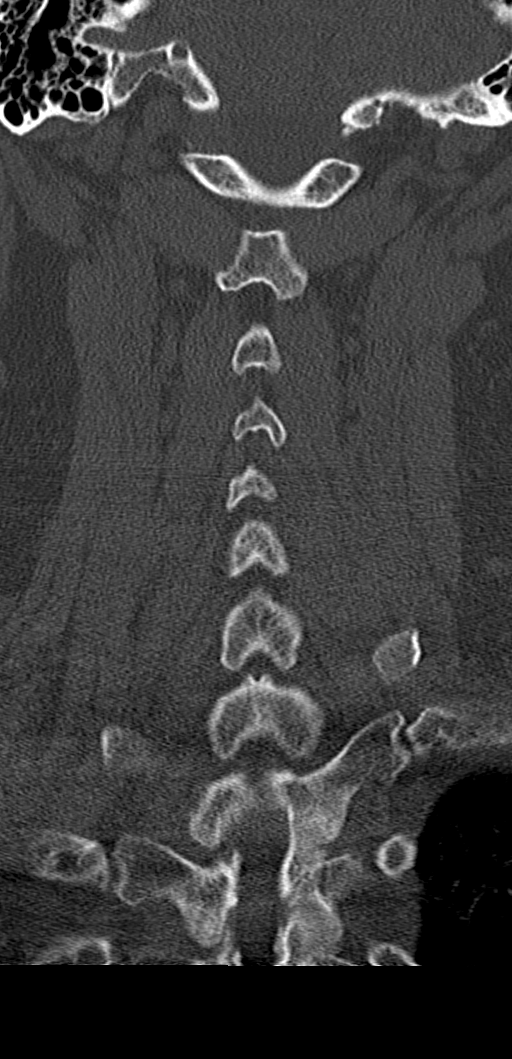

[Series 8: orthogonal bone · axial · 0.21mm/px · z∈[-255,-255]mm · 1 of 122 slices shown, 2 images]
[im 70/122  soft-tissue]
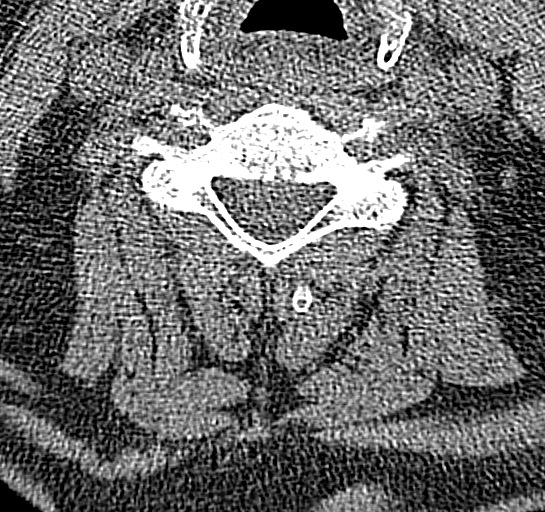
[im 70/122  bone]
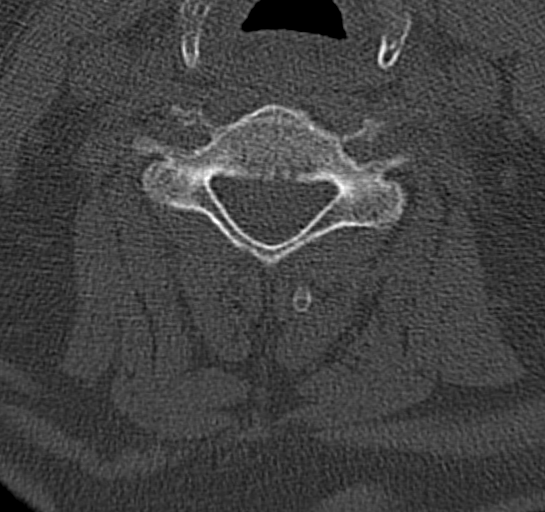

[9 of 33 positions shown; findings below may reference images not displayed]

FINDINGS: Brain: No evidence of acute territorial infarction, hemorrhage,
hydrocephalus,extra-axial collection or mass lesion/mass effect.
There is mild dilatation the ventricles and sulci consistent with
age-related atrophy. Low-attenuation changes in the deep white
matter consistent with small vessel ischemia.

Vascular: No hyperdense vessel or unexpected calcification.

Skull: The skull is intact. No fracture or focal lesion identified.

Sinuses/Orbits: Mucosal thickening seen within the left maxillary
sinus. The orbits and globes intact.

Other: None

Cervical spine:

Alignment: There is straightening of the normal cervical lordosis.

Skull base and vertebrae: Visualized skull base is intact. No
atlanto-occipital dissociation. The vertebral body heights are well
maintained. No fracture or pathologic osseous lesion seen.

Soft tissues and spinal canal: The visualized paraspinal soft
tissues are unremarkable. No prevertebral soft tissue swelling is
seen. The spinal canal is grossly unremarkable, no large epidural
collection or significant canal narrowing.

Disc levels: Mild cervical spondylosis seen most C6-C7 disc
osteophyte uncovertebral osteophytes moderate neural foraminal
narrowing and effacement anterior sac.

Upper chest: The lung apices are clear. Thoracic inlet is within
normal limits.

Other: None
IMPRESSION: No acute intracranial abnormality.

No acute fracture or malalignment of the spine.

## 2021-07-19 IMAGING — CT CT HEAD W/O CM
3 series · 15 of 47 positions shown, 18 images · non-contrast
Comparison: None.

CLINICAL DATA: Near syncopal episode

EXAM:
CT HEAD WITHOUT CONTRAST
TECHNIQUE: Contiguous axial images were obtained from the base of the skull
through the vertex without intravenous contrast.

[Series 3: head wo · axial · 0.45mm/px · z∈[-139,-9]mm · 9 of 32 slices shown, 12 images]
[im 3/32  brain]
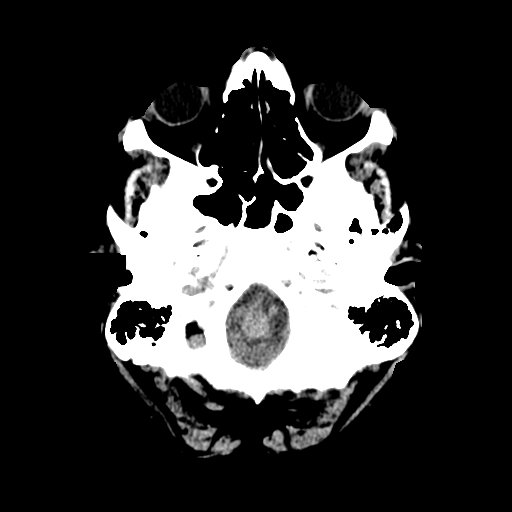
[im 3/32  bone]
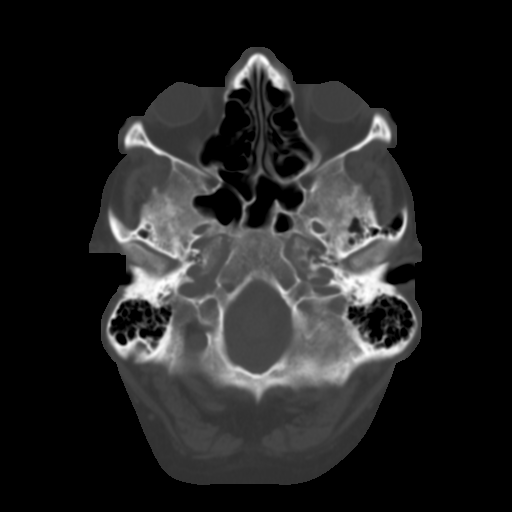
[im 6/32  brain]
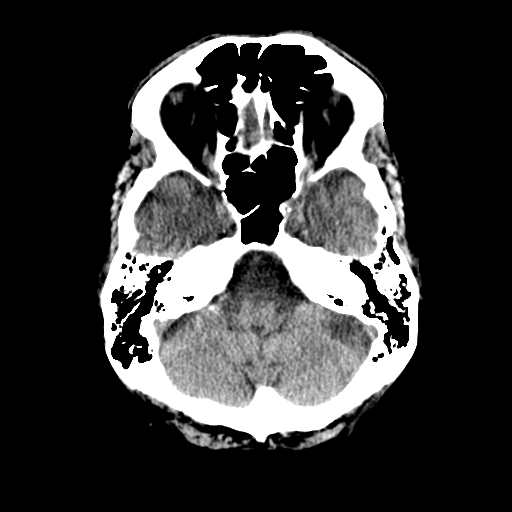
[im 9/32  brain]
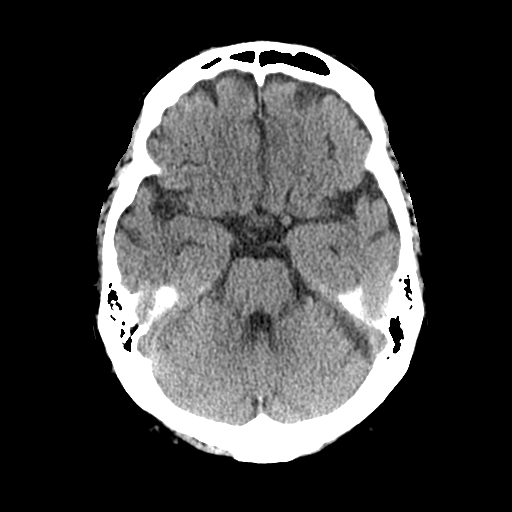
[im 12/32  brain]
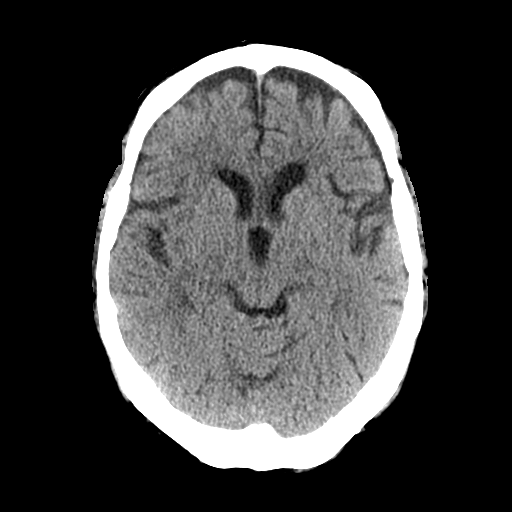
[im 17/32  brain]
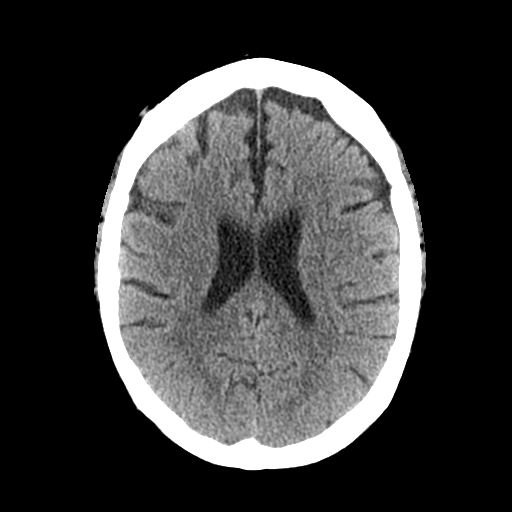
[im 17/32  bone]
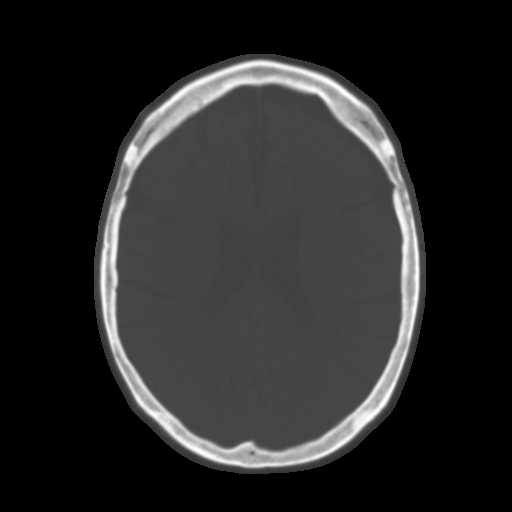
[im 20/32  brain]
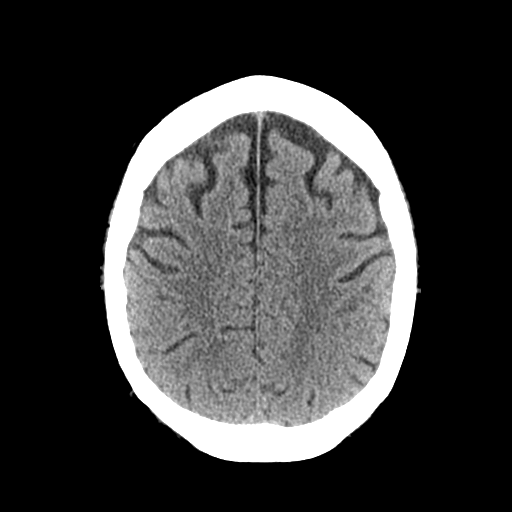
[im 23/32  brain]
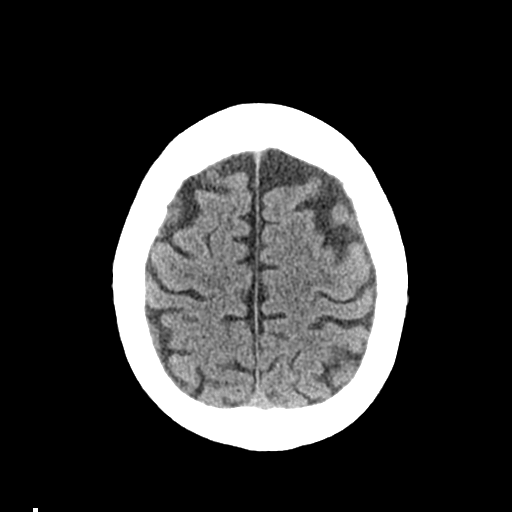
[im 26/32  brain]
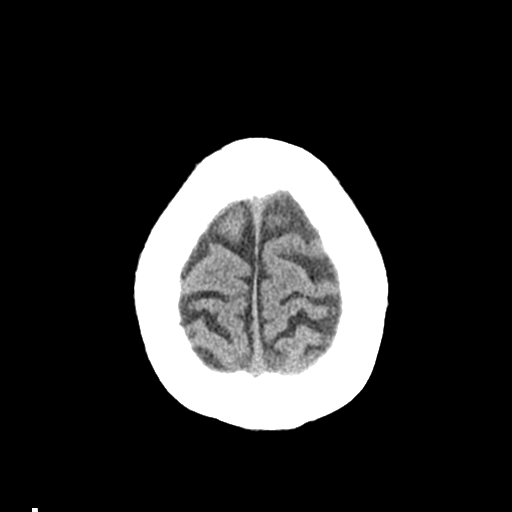
[im 29/32  brain]
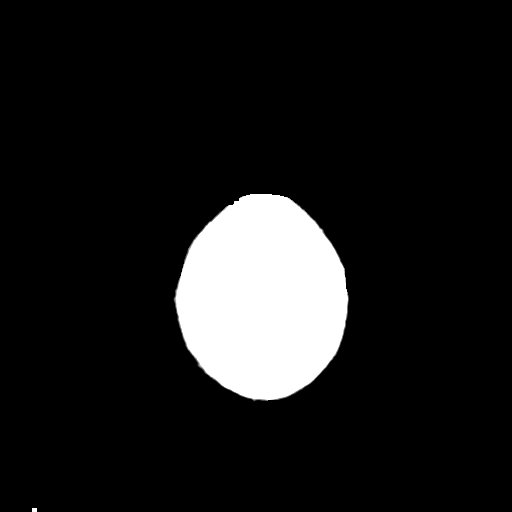
[im 29/32  bone]
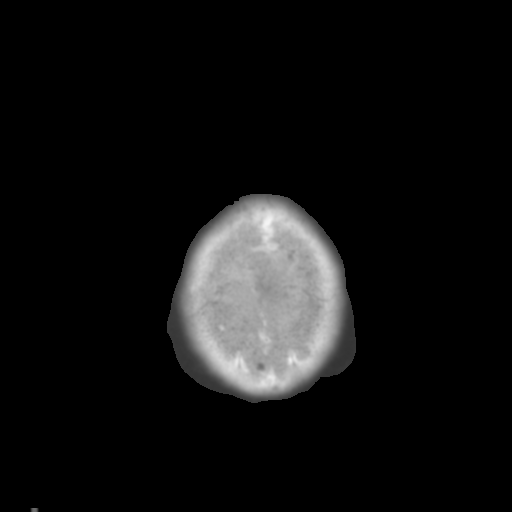

[Series 4: coronal soft tissue · coronal · 0.35mm/px · 3 of 72 slices shown]
[im 24/72  brain]
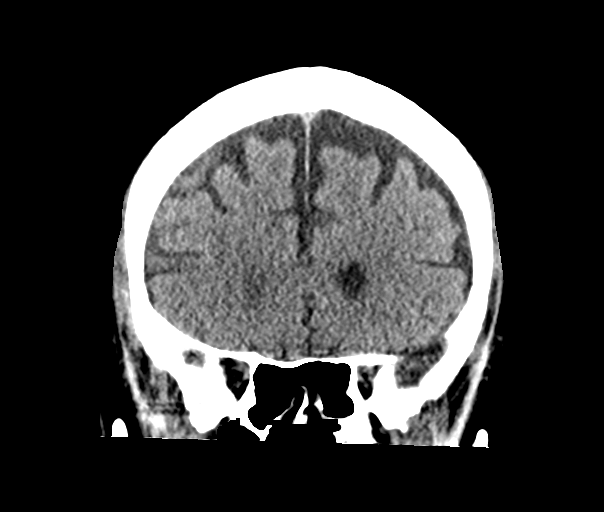
[im 32/72  brain]
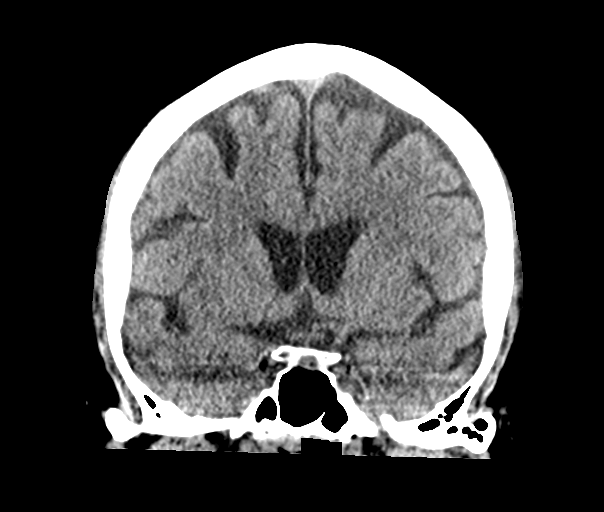
[im 40/72  brain]
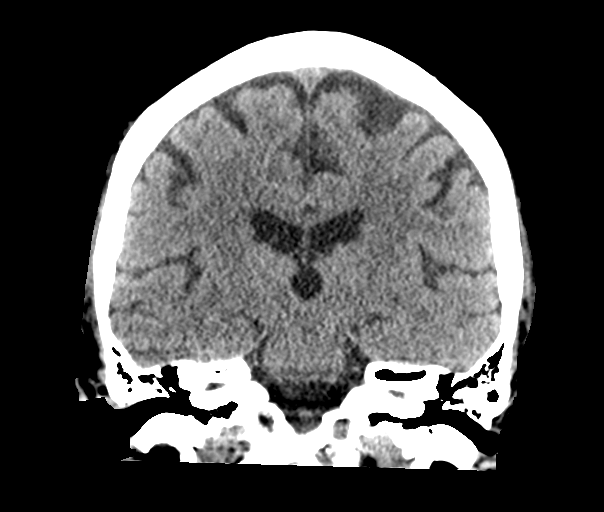

[Series 5: sagittal soft tissue · sagittal · 0.37mm/px · 3 of 57 slices shown]
[im 19/57  brain]
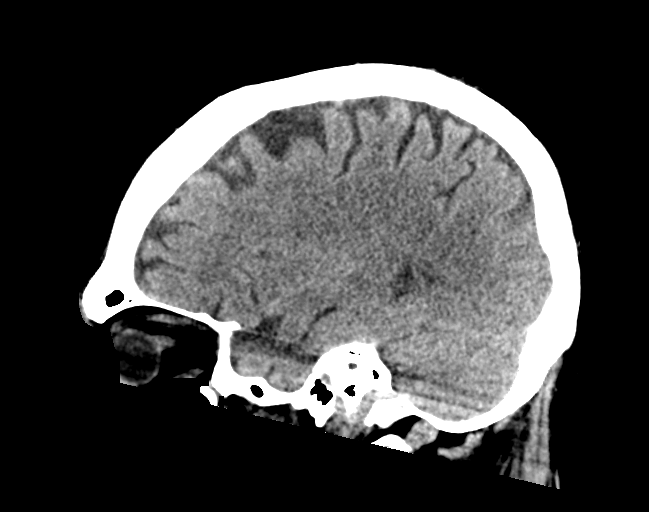
[im 29/57  brain]
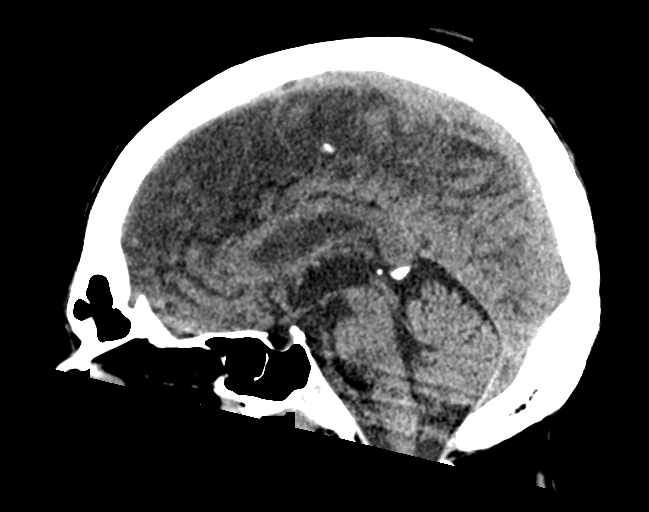
[im 38/57  brain]
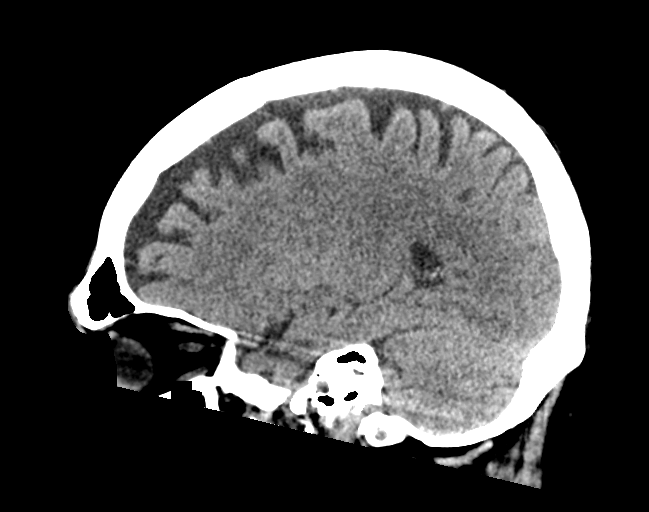

[15 of 47 positions shown; findings below may reference images not displayed]

FINDINGS: Brain: No evidence of acute territorial infarction, hemorrhage,
hydrocephalus,extra-axial collection or mass lesion/mass effect.
There is mild dilatation the ventricles and sulci consistent with
age-related atrophy. Low-attenuation changes in the deep white
matter consistent with small vessel ischemia.

Vascular: No hyperdense vessel or unexpected calcification.

Skull: The skull is intact. No fracture or focal lesion identified.

Sinuses/Orbits: Mucosal thickening seen within the left maxillary
sinus. The orbits and globes intact.

Other: None

Cervical spine:

Alignment: There is straightening of the normal cervical lordosis.

Skull base and vertebrae: Visualized skull base is intact. No
atlanto-occipital dissociation. The vertebral body heights are well
maintained. No fracture or pathologic osseous lesion seen.

Soft tissues and spinal canal: The visualized paraspinal soft
tissues are unremarkable. No prevertebral soft tissue swelling is
seen. The spinal canal is grossly unremarkable, no large epidural
collection or significant canal narrowing.

Disc levels: Mild cervical spondylosis seen most C6-C7 disc
osteophyte uncovertebral osteophytes moderate neural foraminal
narrowing and effacement anterior sac.

Upper chest: The lung apices are clear. Thoracic inlet is within
normal limits.

Other: None
IMPRESSION: No acute intracranial abnormality.

No acute fracture or malalignment of the spine.

## 2021-07-19 IMAGING — DX DG CHEST 1V PORT
1 series · 1 of 1 positions shown · non-contrast
Comparison: 10/03/2020

CLINICAL DATA: Near syncope

EXAM:
PORTABLE CHEST 1 VIEW

[chest ap]
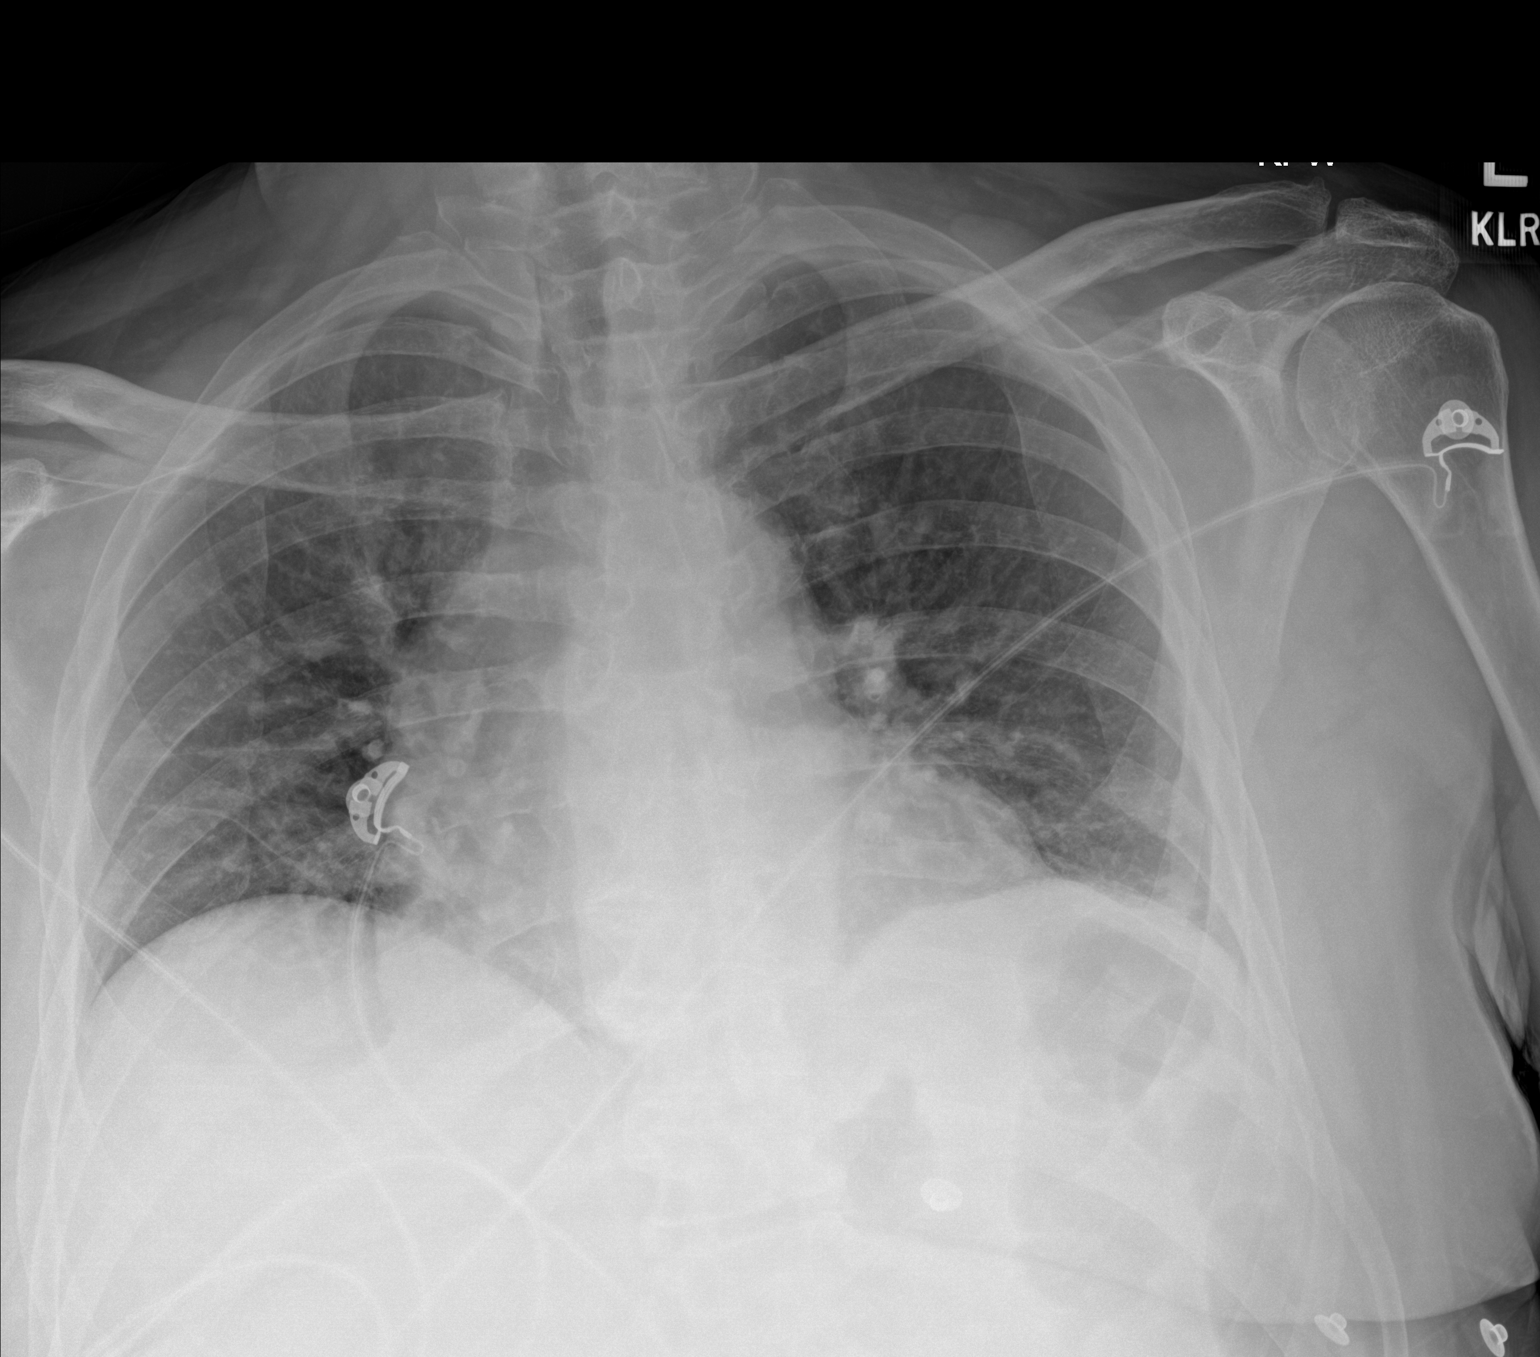

[1 of 1 positions shown; findings below may reference images not displayed]

FINDINGS: Single frontal view of the chest demonstrates an unremarkable
cardiac silhouette. Lung volumes are diminished with crowding of the
pulmonary vasculature. No airspace disease, effusion, or
pneumothorax. No acute bony abnormality.
IMPRESSION: 1. Low lung volumes.  No acute process.

## 2021-07-22 ENCOUNTER — Emergency Department: Payer: Medicaid Other

## 2021-07-22 ENCOUNTER — Emergency Department
Admission: EM | Admit: 2021-07-22 | Discharge: 2021-07-23 | Disposition: A | Discharge status: Home / Self Care | Payer: Medicaid Other | Attending: Emergency Medicine | Admitting: Emergency Medicine

## 2021-07-22 DIAGNOSIS — I10 Essential (primary) hypertension: Secondary | ICD-10-CM | POA: Insufficient documentation

## 2021-07-22 DIAGNOSIS — J45909 Unspecified asthma, uncomplicated: Secondary | ICD-10-CM | POA: Insufficient documentation

## 2021-07-22 DIAGNOSIS — S0990XA Unspecified injury of head, initial encounter: Secondary | ICD-10-CM

## 2021-07-22 DIAGNOSIS — Z79899 Other long term (current) drug therapy: Secondary | ICD-10-CM | POA: Insufficient documentation

## 2021-07-22 DIAGNOSIS — S0101XA Laceration without foreign body of scalp, initial encounter: Secondary | ICD-10-CM | POA: Insufficient documentation

## 2021-07-22 DIAGNOSIS — W01198A Fall on same level from slipping, tripping and stumbling with subsequent striking against other object, initial encounter: Secondary | ICD-10-CM | POA: Insufficient documentation

## 2021-07-22 DIAGNOSIS — Z7951 Long term (current) use of inhaled steroids: Secondary | ICD-10-CM | POA: Insufficient documentation

## 2021-07-22 DIAGNOSIS — Z87891 Personal history of nicotine dependence: Secondary | ICD-10-CM | POA: Insufficient documentation

## 2021-07-22 MED ORDER — LIDOCAINE HCL (PF) 1 % IJ SOLN
INTRAMUSCULAR | Status: AC
  Filled 2021-07-22: qty 5

## 2021-07-22 MED ORDER — IBUPROFEN 600 MG PO TABS
600.0000 mg | ORAL_TABLET | ORAL | Status: AC
  Administered 2021-07-22: 600 mg via ORAL
  Filled 2021-07-22: qty 1

## 2021-07-22 MED ORDER — LIDOCAINE-EPINEPHRINE 2 %-1:100000 IJ SOLN
20.0000 mL | Freq: Once | INTRAMUSCULAR | Status: AC
  Administered 2021-07-22: 20 mL via INTRADERMAL
  Filled 2021-07-22: qty 1

## 2021-07-22 NOTE — ED Provider Notes (Signed)
[facility] Emergency Department Provider Note   ____________________________________________   Event Date/Time   First MD Initiated Contact with Patient 07/22/21 1957     (approximate)  I have reviewed the triage vital signs and the nursing notes.   HISTORY  Chief Complaint Head Injury  EM caveat: Patient somewhat poor historian, does admit to alcohol tonight  HPI Miguel [name] is a 60 y.o. male known history of alcohol abuse hypertension  Patient reports this morning around 10 or 11 AM he fell he was going down a wheelchair ramp that his wife uses.  He does not believe he lost consciousness.  He reports he was not using alcohol at the time, but right afterwards he went in the house and did have a couple alcoholic drinks thereafter  Denies any pain or discomfort.  Reports the back of his scalp was bleeding and swollen.  Not had a recent preceding illness other than he had 4 or 5 teeth pulled at the end of last week.  He reports those are healing well but he does report being a little upset as he paid $240 for dental sedation but reports he was "awake during the whole thing"  No fevers or chills.  No headache.  No pain or injury elsewhere.  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    Right ankle pain 06/26/2020   Family history of breast cancer    Hx of colonic polyps    Hospital discharge follow-up 06/05/2020   Kidney function test abnormal 06/05/2020   Abdominal  pain 04/12/2020   Hypertensive urgency 04/12/2020   Alcohol withdrawal ([place]) 03/04/2020   Right-sided chest wall pain 02/28/2020   Alcohol abuse with alcohol-induced mood disorder ([place]) 12/02/2019   Moderate episode of recurrent major depressive disorder ([place])    Health care maintenance 10/26/2019   Right knee pain 10/26/2019   Generalized anxiety disorder 10/14/2019   MDD (major depressive disorder), recurrent severe, without psychosis ([place]) 07/27/2019   Social anxiety disorder 07/27/2019   Left-sided chest wall pain 38/75/6433   Alcoholic cirrhosis of liver without ascites ([place]) 06/14/2019   Alcohol abuse 06/14/2019   AKI (acute kidney injury) ([place]) 05/27/2019   Alcohol withdrawal syndrome without complication ([place]) 29/51/8841   Alcoholic intoxication without complication ([place])    Sepsis ([place]) 03/08/2019   Breast lump or mass 10/04/2018   Sepsis secondary to UTI ([place]) 09/14/2018   Asthma 07/07/2018   GERD (gastroesophageal reflux disease) 07/07/2018   OCD (obsessive compulsive disorder) 66/04/3015   Self-inflicted laceration of left wrist ([place]) 03/10/2018   Leg hematoma 12/25/2017   Suicide and self-inflicted injury by cutting and piercing instrument ([place]) 03/10/2017   Severe recurrent major 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 ([place]) 03/09/2017   Substance induced mood disorder ([place]) 08/15/2016   Involuntary commitment 08/15/2016   Alcohol use disorder, severe, dependence ([place])  02/05/2016   Hypertension 12/05/2015   Tachycardia 12/05/2015   Gout 11/13/2015   Chronic back pain 05/02/2015    Past Surgical History:  Procedure Laterality Date   CHOLECYSTECTOMY  2012   COLONOSCOPY     COLONOSCOPY WITH PROPOFOL N/A 05/28/2020   Procedure: COLONOSCOPY WITH PROPOFOL;  Surgeon: [name], MD;  Location[facility] ENDOSCOPY;  Service: Gastroenterology;  Laterality: N/A;   EXTRACORPOREAL SHOCK WAVE LITHOTRIPSY Left 01/12/2019   Procedure: EXTRACORPOREAL SHOCK WAVE LITHOTRIPSY (ESWL);   Surgeon: Billey Co, MD;  Location: [facility] ORS;  Service: Urology;  Laterality: Left;   UPPER GI ENDOSCOPY      Prior to Admission medications   Medication Sig Start Date End Date Taking? Authorizing Provider  ADVAIR DISKUS 100-50 MCG/ACT AEPB INHALE 1 PUFF 2 TIMES A DAY. RINSE MOUTH AND SPIT AFTER EACH USE. 06/12/20 06/19/21  Tawni Millers, MD  allopurinol (ZYLOPRIM) 300 MG tablet TAKE ONE TABLET BY MOUTH EVERY DAY 04/15/21 11/01/21  Iloabachie, Chioma E, NP  ascorbic acid (VITAMIN C) 500 MG tablet Take by mouth. 08/01/19   [provider]  fluticasone (FLONASE) 50 MCG/ACT nasal spray SPRAY ONE SPRAY INTO BOTH NOSTRILS ONCE DAILY. 05/16/21 05/16/22  Lannie Fields, PA-C  folic acid (FOLVITE) 1 MG tablet TAKE ONE TABLET BY MOUTH EVERY DAY 05/21/21 05/21/22  Iloabachie, Chioma E, NP  HYDROcodone-acetaminophen (NORCO/VICODIN) 5-325 MG tablet Take 1 tablet by mouth every 6 (six) hours as needed. 07/02/21   Ward, Delice Bison, DO  hydrOXYzine (ATARAX/VISTARIL) 10 MG tablet Take 1 tablet (10 mg total) by mouth at bedtime as needed (sleep). 06/23/21   Merlyn Lot, MD  ibuprofen (ADVIL) 800 MG tablet Take 1 tablet (800 mg total) by mouth every 8 (eight) hours as needed for mild pain. 07/02/21   Ward, Delice Bison, DO  lisinopril (ZESTRIL) 40 MG tablet TAKE ONE TABLET BY MOUTH EVERY DAY 04/15/21 04/15/22  Iloabachie, Chioma E, NP  loratadine (CLARITIN) 10 MG tablet Take 10 mg by mouth daily.    [provider]  metoprolol tartrate (LOPRESSOR) 25 MG tablet Take 1 tablet (25 mg total) by mouth once daily. 05/21/21 06/25/21  Iloabachie, Chioma E, NP  mirtazapine (REMERON) 15 MG tablet Take 1 tablet (15 mg total) by mouth at bedtime. 05/21/21 06/25/21  Iloabachie, Chioma E, NP  pantoprazole (PROTONIX) 40 MG tablet Take 1 tablet (40 mg total) by mouth once daily. 06/28/21 06/28/22  [name], MD  pseudoephedrine (SUDAFED) 120 MG 12 hr tablet Take 120 mg by mouth daily.    [provider]   thiamine (VITAMIN B-1) 100 MG tablet TAKE ONE TABLET BY MOUTH EVERY DAY 10/22/20 10/22/21  Iloabachie, Chioma E, NP  albuterol (VENTOLIN HFA) 108 (90 Base) MCG/ACT inhaler Inhale 2 puffs into the lungs every 4 (four) hours as needed. 05/27/21 05/27/22  Iloabachie, Chioma E, NP    Allergies Patient has no known allergies.  Family History  Problem Relation Age of Onset   Alcohol abuse Father    Breast cancer Mother 49   Anxiety disorder Brother    Other Maternal Grandmother        unknown medical history   Other Maternal Grandfather        unknow medical history   Other Paternal Grandmother        unknown medical history   Other Paternal Grandfather        unknown medical history   Healthy Brother     Social History Social History   Tobacco  Use   Smoking status: Former    Types: Cigarettes    Quit date: 03/19/1981    Years since quitting: 40.3   Smokeless tobacco: Never  Vaping Use   Vaping Use: Never used  Substance Use Topics   Alcohol use: Yes    Alcohol/week: 24.0 standard drinks    Types: 24 Shots of liquor per week    Comment: LAst drink Friday 06/20/21   Drug use: No    Review of Systems  EM caveat   ____________________________________________   PHYSICAL EXAM:  VITAL SIGNS: ED Triage Vitals [07/22/21 1745]  Enc Vitals Group     BP 129/83     Pulse Rate 95     Resp 20     Temp 97.7 F (36.5 C)     Temp Source Oral     SpO2 95 %     Weight      Height      Head Circumference      Peak Flow      Pain Score 4     Pain Loc      Pain Edu?      Excl. in [place]?     Constitutional: Alert and oriented. Well appearing and in no acute distress. Eyes: Conjunctivae are mildly injected bilateral.  Mild ataxia through the lateral fields bilateral Head: Atraumatic across the front and sides, however over the vertex of the scalp slightly to the right there is a moderate-sized hematoma with a small approximately 2 cm laceration with slight bleeding.  No foreign  body. Nose: No congestion/rhinnorhea. Mouth/Throat: Mucous membranes are moist. Neck: No stridor.  Cardiovascular: Normal rate, regular rhythm. Grossly normal heart sounds.  Good peripheral circulation. Respiratory: Normal respiratory effort.  No retractions. Lungs CTAB. Gastrointestinal: Soft and nontender. No distention. Musculoskeletal: No lower extremity tenderness nor edema.  I moves all extremities well without noted deficit.  No other denoted injuries.  No long bone injuries or pain Neurologic:  Normal speech and language. No gross focal neurologic deficits are appreciated.  Skin:  Skin is warm, dry and intact. No rash noted. Psychiatric: Mood and affect are normal.  Speech is slightly slurred but fully oriented.  Able to hold conversation without difficulty.  No psychomotor agitation.  ____________________________________________   LABS (all labs ordered are listed, but only abnormal results are displayed)  Labs Reviewed - No data to display ____________________________________________  EKG   ____________________________________________  RADIOLOGY  CT HEAD WO CONTRAST (5MM)  Result Date: 07/22/2021 CLINICAL DATA:  Head trauma, mod-severe positive LOC Fall striking head on door knob. EXAM: CT HEAD WITHOUT CONTRAST TECHNIQUE: Contiguous axial images were obtained from the base of the skull through the vertex without intravenous contrast. COMPARISON:  Multiple priors most recently 07/07/2021 FINDINGS: Brain: Stable degree of atrophy and chronic small vessel ischemia. No intracranial hemorrhage, mass effect, or midline shift. No hydrocephalus. The basilar cisterns are patent. No evidence of territorial infarct or acute ischemia. No extra-axial or intracranial fluid collection. Vascular: No hyperdense vessel or unexpected calcification. Skull: No fracture or focal lesion. Sinuses/Orbits: Chronic opacification of ethmoid air cells. No acute findings. Mastoid air cells are clear Other:  Right parietooccipital scalp hematoma. IMPRESSION: 1. Right parietooccipital scalp hematoma. 2. No acute intracranial abnormality. No skull fracture. 3. Stable atrophy and chronic small vessel ischemia. Electronically Signed   By: Keith Rake M.D.   On: 07/22/2021 18:58   CT CERVICAL SPINE WO CONTRAST  Result Date: 07/22/2021 CLINICAL DATA:  Head trauma, mod-severe positive  LOC Fall striking head on door knob. EXAM: CT CERVICAL SPINE WITHOUT CONTRAST TECHNIQUE: Multidetector CT imaging of the cervical spine was performed without intravenous contrast. Multiplanar CT image reconstructions were also generated. COMPARISON:  Multiple prior exams most recently 07/07/2021 FINDINGS: Alignment: Straightening of normal lordosis. No traumatic subluxation. Skull base and vertebrae: No acute fracture. Vertebral body heights are maintained. The dens and skull base are intact. Soft tissues and spinal canal: No prevertebral fluid or swelling. No visible canal hematoma. Disc levels: Multilevel degenerative disc disease is unchanged from prior exam. Minor scattered facet hypertrophy. Upper chest: Nonacute. Other: Carotid calcifications. IMPRESSION: 1. No acute fracture or subluxation of the cervical spine. 2. Stable degenerative change. Electronically Signed   By: Keith Rake M.D.   On: 07/22/2021 19:02     CT cervical spine negative for acute.  CT head with right parieto-occipital scalp hematoma.  No acute intracranial injury ____________________________________________   PROCEDURES  Procedure(s) performed:  Laceration repair  .Marland KitchenLaceration Repair  Date/Time: 07/22/2021 8:31 PM Performed by: Delman Kitten, MD Authorized by: Delman Kitten, MD   Consent:    Consent obtained:  Verbal   Consent given by:  Patient   Risks discussed:  Infection, pain and retained foreign body Universal protocol:    Procedure explained and questions answered to patient or proxy's satisfaction: yes     Imaging studies available:  yes     Patient identity confirmed:  Verbally with patient Anesthesia:    Anesthesia method:  Local infiltration   Local anesthetic:  Lidocaine 1% w/o epi and lidocaine 1% WITH epi (5 ml) Laceration details:    Location:  Scalp   Scalp location:  R parietal   Length (cm):  2   Depth (mm):  4 Pre-procedure details:    Preparation:  Patient was prepped and draped in usual sterile fashion and imaging obtained to evaluate for foreign bodies Exploration:    Hemostasis achieved with:  Direct pressure and epinephrine   Imaging outcome: foreign body not noted     Wound exploration: wound explored through full range of motion     Wound extent: areolar tissue violated     Wound extent: no fascia violation noted, no foreign bodies/material noted, no muscle damage noted, no tendon damage noted, no underlying fracture noted and no vascular damage noted     Contaminated: no   Treatment:    Area cleansed with:  Saline and povidone-iodine   Amount of cleaning:  Extensive   Irrigation solution:  Sterile saline   Irrigation volume:  200 ml   Irrigation method:  Syringe   Visualized foreign bodies/material removed: no     Debridement:  None   Undermining:  None Skin repair:    Repair method:  Staples (6)   Number of staples:  6 Approximation:    Approximation:  Close Repair type:    Repair type:  Simple Post-procedure details:    Dressing:  Sterile dressing   Procedure completion:  Tolerated well, no immediate complications  Critical Care performed: No  ____________________________________________   INITIAL IMPRESSION / ASSESSMENT AND PLAN / ED COURSE  Pertinent labs & imaging results that were available during my care of the patient were reviewed by me and considered in my medical decision making (see chart for details).   Patient presents after fall.  Reports he stumbled while going up his wife's wheelchair ramp.  He also freely admits to alcohol consumption which he reports occurred  after the fall.  Patient does have a  history of chronic alcohol abuse frequent falls.  Ice presentation today seems to demonstrate likely mechanical fall secondary to probable alcohol use.  He is alert oriented but does have slight slurred speech mild ataxia with evidence clinically of mild to moderate alcohol intoxication at this time.  No acute intracranial injury, no cervical injury.  Patient alert and oriented laceration repaired with good effect and good bleeding control with 6 staples.  Discussed with the patient he is understanding agreeable with the plan to obtain a sober ride home with plan for follow-up and staple removal in approximately 10 days.  ----------------------------------------- 11:45 PM on 07/22/2021 ----------------------------------------- Patient is resting comfortably.  He did get 1 dose ibuprofen as he reports he had several teeth extracted on Friday and has been taking ibuprofen as needed for dental pain.  At this time he is resting comfortably now without distress.  We are currently awaiting the patient to sober up slightly as he reports heavy alcohol use prior to the fall endorses some clinical evidence of alcohol intoxication on initial presentation.  He has been up and ambulated but seems to have a slightly unsteady gait, currently on fall precautions.  Ongoing care assigned to Dr. Beather Arbour.  This time observing the patient for clinical sobriety and safe disposition to home as appropriate.        ____________________________________________   FINAL CLINICAL IMPRESSION(S) / ED DIAGNOSES  Final diagnoses:  Injury of head, initial encounter  Scalp laceration, initial encounter        Note:  This document was prepared using Dragon voice recognition software and may include unintentional dictation errors       Delman Kitten, MD 07/22/21 2346

## 2021-07-22 NOTE — ED Triage Notes (Signed)
Pt presents to ED with c/o of falling and hitting his head on a door knob. Pt appears intoxicated at this time. EMS states there was 1 minute of LOC after pot fell. First note bandaged head, bleeding controlled at this time. Pt is A&Ox4. NAD noted.

## 2021-07-22 NOTE — Discharge Instructions (Signed)
You have 6 skin staples in the scalp.  Please follow-up with your doctor's office or urgent care in approximately 10 to 12 days to have these evaluated for removal and wound check.  No driving today or while using alcohol.  I recommend you consider obtaining alcohol treatment, I do recommend you follow-up with [facility] (see follow-up suggestions section)

## 2021-07-23 NOTE — ED Provider Notes (Signed)
-----------------------------------------   12:29 AM on 07/23/2021 -----------------------------------------   Patient feeling better, ambulatory with steady gait.  Taxi voucher to go home.  Strict return precautions given.  Patient verbalizes understanding and agrees with plan of care.   Paulette Blanch, MD 07/23/21 [phone]

## 2021-07-23 NOTE — ED Notes (Signed)
Pt is ambulatory with a steady gait.

## 2021-07-24 IMAGING — CT CT CERVICAL SPINE W/O CM
3 of 4 series · 11 of 33 positions shown, 13 images · non-contrast
Comparison: 10/18/2020.

CLINICAL DATA: Fall.

EXAM:
CT HEAD WITHOUT CONTRAST
CT CERVICAL SPINE WITHOUT CONTRAST
TECHNIQUE: Multidetector CT imaging of the head and cervical spine was
performed following the standard protocol without intravenous
contrast. Multiplanar CT image reconstructions of the cervical spine
were also generated.

[Series 4: sagittal bone · sagittal · 0.28mm/px · 5 of 81 slices shown, 6 images]
[im 27/81  bone]
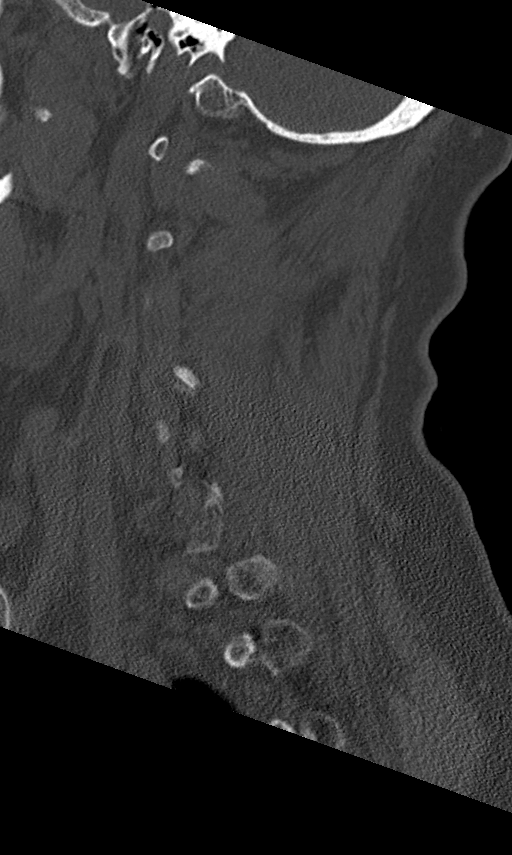
[im 34/81  bone]
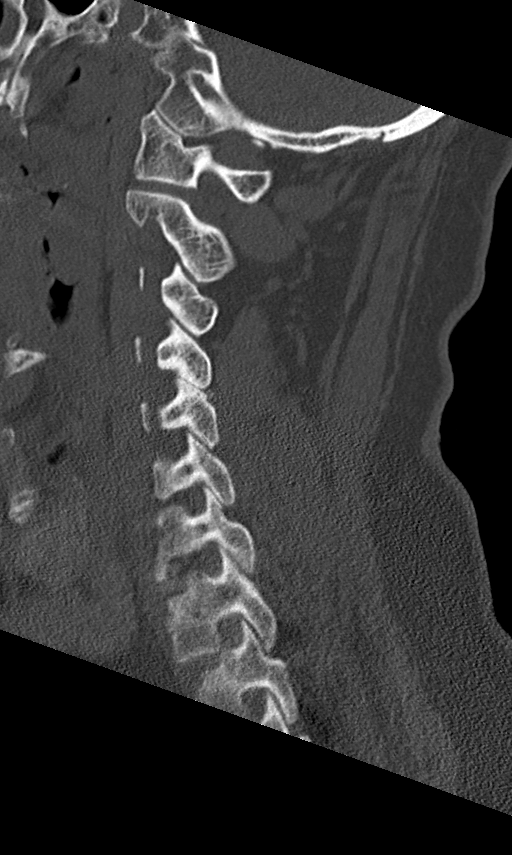
[im 41/81  soft-tissue]
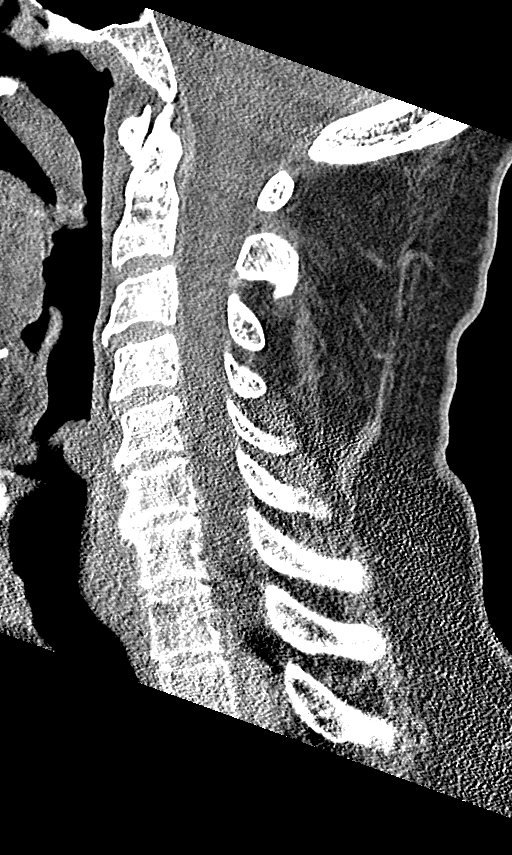
[im 41/81  bone]
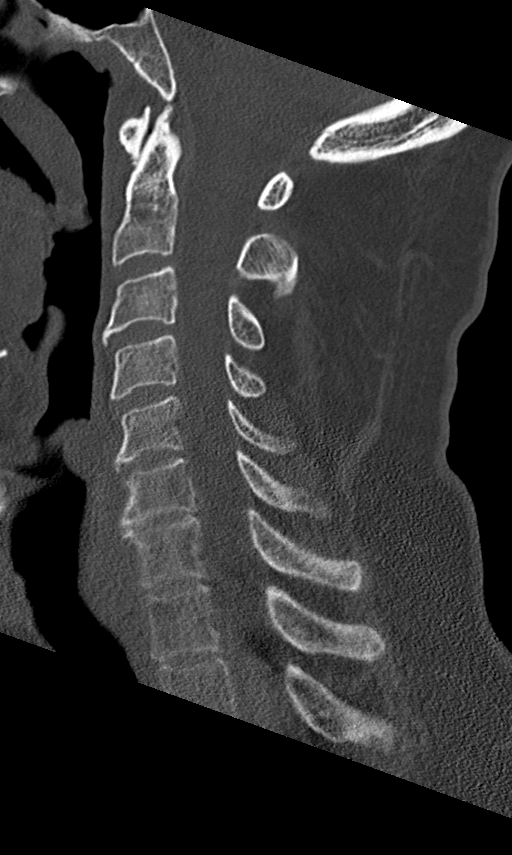
[im 47/81  bone]
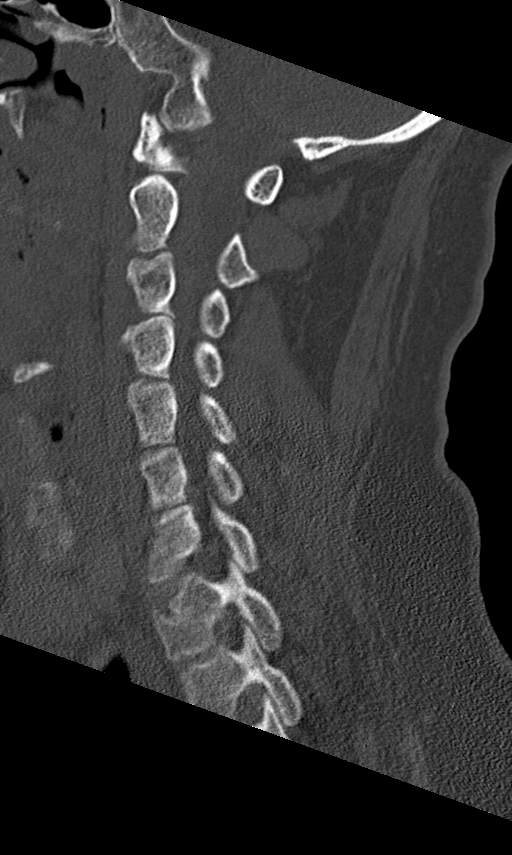
[im 54/81  bone]
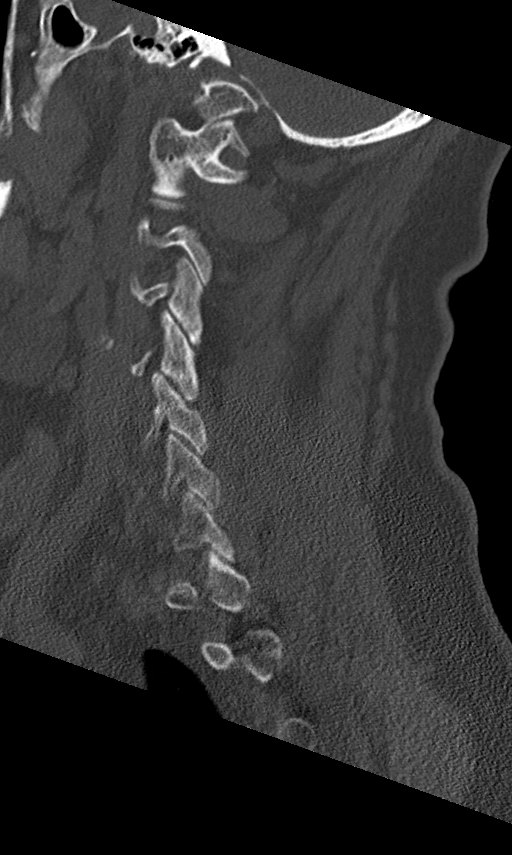

[Series 5: coronal bone · coronal · 0.28mm/px · 3 of 68 slices shown]
[im 15/68  bone]
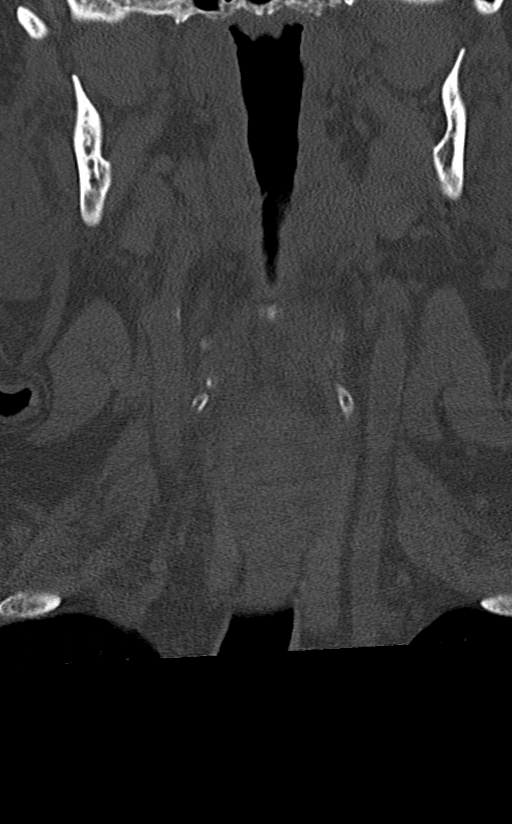
[im 28/68  bone]
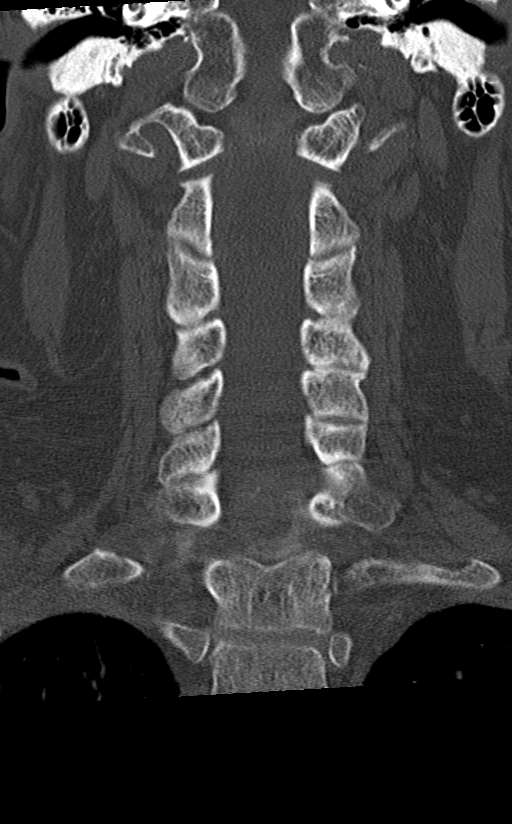
[im 41/68  bone]
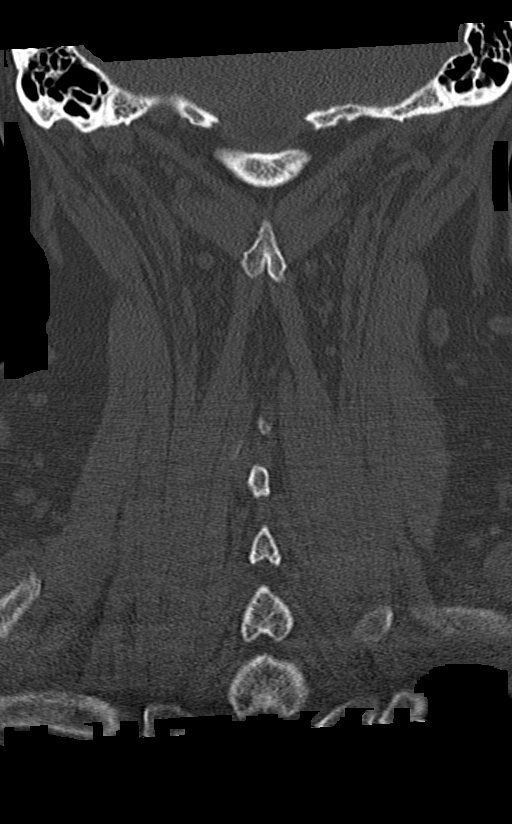

[Series 6: orthogonal bone · axial · 0.27mm/px · z∈[-293,-177]mm · 3 of 110 slices shown, 4 images]
[im 32/110  soft-tissue]
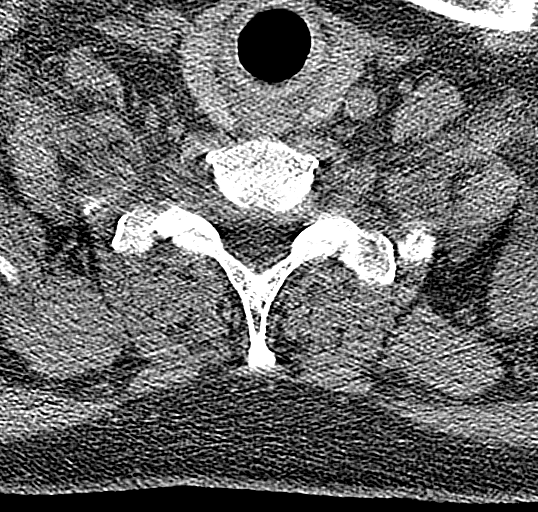
[im 32/110  bone]
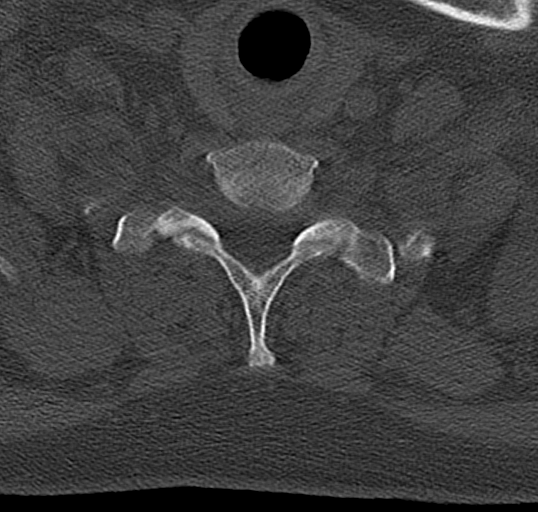
[im 63/110  bone]
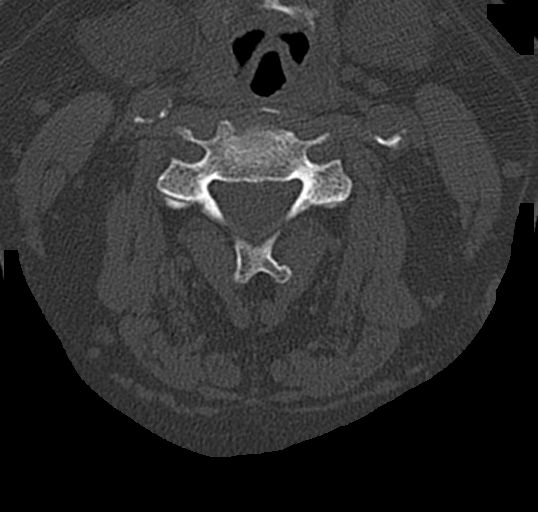
[im 94/110  bone]
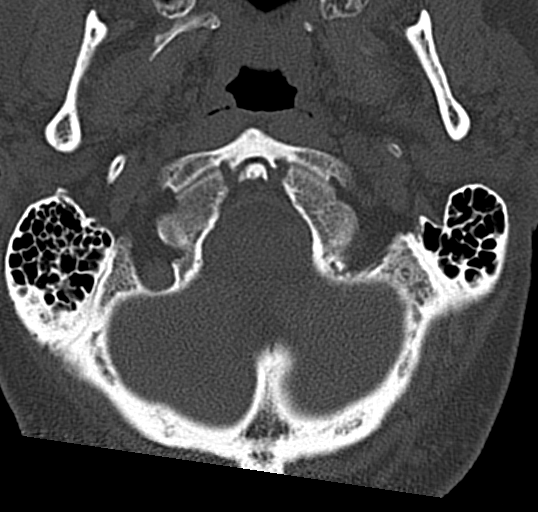

[11 of 33 positions shown; findings below may reference images not displayed]

FINDINGS: CT HEAD FINDINGS

Brain: No evidence of acute infarction, hemorrhage, hydrocephalus,
extra-axial collection or mass lesion/mass effect. Similar mild
white matter hypodensities, likely related to chronic microvascular
disease.

Vascular: Calcific atherosclerosis.

Skull: No acute fracture.

Sinuses/Orbits: Partially imaged left maxillary sinus mucosal
thickening. Unremarkable orbits.

Other: No mastoid effusions.

CT CERVICAL SPINE FINDINGS

Alignment: Normal.

Skull base and vertebrae: No acute fracture. Vertebral body heights
are maintained.

Soft tissues and spinal canal: No prevertebral fluid or swelling. No
visible canal hematoma.

Disc levels: Similar mild degenerative disc disease, greatest at
C6-C7.

Upper chest: No evidence of acute abnormality.
IMPRESSION: 1. No evidence of acute intracranial abnormality.
2. No evidence of acute fracture or traumatic malalignment the
cervical spine.

## 2021-07-28 ENCOUNTER — Emergency Department: Payer: Medicaid Other

## 2021-07-28 ENCOUNTER — Other Ambulatory Visit: Payer: Self-pay

## 2021-07-28 ENCOUNTER — Telehealth: Payer: Self-pay | Admitting: Pharmacist

## 2021-07-28 ENCOUNTER — Emergency Department
Admission: EM | Admit: 2021-07-28 | Discharge: 2021-07-28 | Disposition: A | Discharge status: Home / Self Care | Payer: Medicaid Other | Attending: Emergency Medicine | Admitting: Emergency Medicine

## 2021-07-28 DIAGNOSIS — I10 Essential (primary) hypertension: Secondary | ICD-10-CM | POA: Insufficient documentation

## 2021-07-28 DIAGNOSIS — Z79899 Other long term (current) drug therapy: Secondary | ICD-10-CM | POA: Insufficient documentation

## 2021-07-28 DIAGNOSIS — R0981 Nasal congestion: Secondary | ICD-10-CM | POA: Insufficient documentation

## 2021-07-28 DIAGNOSIS — J45909 Unspecified asthma, uncomplicated: Secondary | ICD-10-CM | POA: Insufficient documentation

## 2021-07-28 DIAGNOSIS — Z7951 Long term (current) use of inhaled steroids: Secondary | ICD-10-CM | POA: Insufficient documentation

## 2021-07-28 DIAGNOSIS — Z87891 Personal history of nicotine dependence: Secondary | ICD-10-CM | POA: Insufficient documentation

## 2021-07-28 MED ORDER — HYDROXYZINE PAMOATE 100 MG PO CAPS: 100.0000 mg | ORAL_CAPSULE | Freq: Three times a day (TID) | ORAL | 0 refills | Status: DC | PRN

## 2021-07-28 NOTE — ED Provider Notes (Signed)
[facility] Emergency Department Provider Note  ____________________________________________  Time seen: Approximately 6:37 PM  I have reviewed the triage vital signs and the nursing notes.   HISTORY  Chief Complaint Shortness of Breath    HPI Miguel [name] is a 60 y.o. male who presents the emergency department complaining of nasal congestion.  Patient is well-known to this department and myself.  Patient arrives today via EMS from home for complaint of congestion.  He states that he has been taking his Zicam without any relief of his congestion.  Patient denies any fevers or chills, sore throat, cough.  Patient states that his congestion is keeping him awake because "of 1 side of my nose is plugged he cannot sleep."  Patient again denies any other complaint.  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    Right ankle pain 06/26/2020   Family history of breast cancer    Hx of colonic polyps    Hospital discharge follow-up 06/05/2020   Kidney function test abnormal 06/05/2020   Abdominal pain 04/12/2020   Hypertensive urgency 04/12/2020   Alcohol withdrawal ([place]) 03/04/2020   Right-sided chest wall pain 02/28/2020   Alcohol abuse with alcohol-induced mood disorder ([place]) 12/02/2019   Moderate episode of recurrent major depressive disorder ([place])    Health care maintenance 10/26/2019   Right knee pain 10/26/2019   Generalized  anxiety disorder 10/14/2019   MDD (major depressive disorder), recurrent severe, without psychosis ([place]) 07/27/2019   Social anxiety disorder 07/27/2019   Left-sided chest wall pain 88/41/6606   Alcoholic cirrhosis of liver without ascites ([place]) 06/14/2019   Alcohol abuse 06/14/2019   AKI (acute kidney injury) ([place]) 05/27/2019   Alcohol withdrawal syndrome without complication ([place]) 30/16/0109   Alcoholic intoxication without complication ([place])    Sepsis ([place]) 03/08/2019   Breast lump or mass 10/04/2018   Sepsis secondary to UTI ([place]) 09/14/2018   Asthma 07/07/2018   GERD (gastroesophageal reflux disease) 07/07/2018   OCD (obsessive compulsive disorder) 32/35/5732   Self-inflicted laceration of left wrist ([place]) 03/10/2018   Leg hematoma 12/25/2017   Suicide and self-inflicted injury by cutting and piercing instrument ([place]) 03/10/2017   Severe recurrent major 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 ([place]) 03/09/2017   Substance induced mood disorder ([place]) 08/15/2016   Involuntary commitment 08/15/2016   Alcohol use disorder, severe, dependence ([place]) 02/05/2016   Hypertension 12/05/2015   Tachycardia 12/05/2015   Gout 11/13/2015   Chronic back pain 05/02/2015    Past Surgical History:  Procedure Laterality Date   CHOLECYSTECTOMY  2012   COLONOSCOPY     COLONOSCOPY WITH PROPOFOL N/A 05/28/2020   Procedure: COLONOSCOPY WITH PROPOFOL;  Surgeon: [name], MD;  Location: [facility] ENDOSCOPY;  Service: Gastroenterology;  Laterality: N/A;   EXTRACORPOREAL SHOCK WAVE LITHOTRIPSY Left  01/12/2019   Procedure: EXTRACORPOREAL SHOCK WAVE LITHOTRIPSY (ESWL);  Surgeon: Billey Co, MD;  Location: [facility] ORS;  Service: Urology;  Laterality: Left;   UPPER GI ENDOSCOPY      Prior to Admission medications   Medication Sig Start Date End Date Taking? Authorizing Provider  hydrOXYzine (VISTARIL) 100 MG capsule Take 1 capsule (100 mg total) by mouth 3 (three) times daily as needed (congestion). 07/28/21   Yes Shamere Dilworth, Charline Bills, PA-C  ADVAIR DISKUS 100-50 MCG/ACT AEPB INHALE 1 PUFF 2 TIMES A DAY. RINSE MOUTH AND SPIT AFTER EACH USE. 06/12/20 06/19/21  Tawni Millers, MD  allopurinol (ZYLOPRIM) 300 MG tablet TAKE ONE TABLET BY MOUTH EVERY DAY 04/15/21 11/01/21  Iloabachie, Chioma E, NP  ascorbic acid (VITAMIN C) 500 MG tablet Take by mouth. 08/01/19   [provider]  fluticasone (FLONASE) 50 MCG/ACT nasal spray SPRAY ONE SPRAY INTO BOTH NOSTRILS ONCE DAILY. 05/16/21 05/16/22  Lannie Fields, PA-C  folic acid (FOLVITE) 1 MG tablet TAKE ONE TABLET BY MOUTH EVERY DAY 05/21/21 05/21/22  Iloabachie, Chioma E, NP  HYDROcodone-acetaminophen (NORCO/VICODIN) 5-325 MG tablet Take 1 tablet by mouth every 6 (six) hours as needed. 07/02/21   Ward, Delice Bison, DO  hydrOXYzine (ATARAX/VISTARIL) 10 MG tablet Take 1 tablet (10 mg total) by mouth at bedtime as needed (sleep). 06/23/21   Merlyn Lot, MD  ibuprofen (ADVIL) 800 MG tablet Take 1 tablet (800 mg total) by mouth every 8 (eight) hours as needed for mild pain. 07/02/21   Ward, Delice Bison, DO  lisinopril (ZESTRIL) 40 MG tablet TAKE ONE TABLET BY MOUTH EVERY DAY 04/15/21 04/15/22  Iloabachie, Chioma E, NP  loratadine (CLARITIN) 10 MG tablet Take 10 mg by mouth daily.    [provider]  metoprolol tartrate (LOPRESSOR) 25 MG tablet Take 1 tablet (25 mg total) by mouth once daily. 05/21/21 06/25/21  Iloabachie, Chioma E, NP  mirtazapine (REMERON) 15 MG tablet Take 1 tablet (15 mg total) by mouth at bedtime. 05/21/21 06/25/21  Iloabachie, Chioma E, NP  pantoprazole (PROTONIX) 40 MG tablet Take 1 tablet (40 mg total) by mouth once daily. 06/28/21 06/28/22  [name], MD  pseudoephedrine (SUDAFED) 120 MG 12 hr tablet Take 120 mg by mouth daily.    [provider]  thiamine (VITAMIN B-1) 100 MG tablet TAKE ONE TABLET BY MOUTH EVERY DAY 10/22/20 10/22/21  Iloabachie, Chioma E, NP  albuterol (VENTOLIN HFA) 108 (90 Base) MCG/ACT inhaler Inhale 2 puffs into  the lungs every 4 (four) hours as needed. 05/27/21 05/27/22  Iloabachie, Chioma E, NP    Allergies Patient has no known allergies.  Family History  Problem Relation Age of Onset   Alcohol abuse Father    Breast cancer Mother 26   Anxiety disorder Brother    Other Maternal Grandmother        unknown medical history   Other Maternal Grandfather        unknow medical history   Other Paternal Grandmother        unknown medical history   Other Paternal Grandfather        unknown medical history   Healthy Brother     Social History Social History   Tobacco Use   Smoking status: Former    Types: Cigarettes    Quit date: 03/19/1981    Years since quitting: 40.3   Smokeless tobacco: Never  Vaping Use   Vaping Use: Never used  Substance Use Topics   Alcohol use:  Yes    Alcohol/week: 24.0 standard drinks    Types: 24 Shots of liquor per week    Comment: LAst drink Friday 06/20/21   Drug use: No     Review of Systems  Constitutional: No fever/chills Eyes: No visual changes. No discharge ENT: Positive for nasal congestion Cardiovascular: no chest pain. Respiratory: no cough. No SOB. Gastrointestinal: No abdominal pain.  No nausea, no vomiting.  No diarrhea.  No constipation. Musculoskeletal: Negative for musculoskeletal pain. Skin: Negative for rash, abrasions, lacerations, ecchymosis. Neurological: Negative for headaches, focal weakness or numbness.  10 System ROS otherwise negative.  ____________________________________________   PHYSICAL EXAM:  VITAL SIGNS: ED Triage Vitals [07/28/21 1818]  Enc Vitals Group     BP      Pulse      Resp      Temp      Temp src      SpO2      Weight 199 lb 15.3 oz (90.7 kg)     Height 6' (1.829 m)     Head Circumference      Peak Flow      Pain Score      Pain Loc      Pain Edu?      Excl. in [place]?      Constitutional: Alert and oriented. Well appearing and in no acute distress. Eyes: Conjunctivae are normal. PERRL.  EOMI. Head: Atraumatic. ENT:      Ears:       Nose: No significant congestion/rhinnorhea.      Mouth/Throat: Mucous membranes are moist.  Neck: No stridor.   Hematological/Lymphatic/Immunilogical: No cervical lymphadenopathy. Cardiovascular: Normal rate, regular rhythm. Normal S1 and S2.  Good peripheral circulation. Respiratory: Normal respiratory effort without tachypnea or retractions. Lungs CTAB. Good air entry to the bases with no decreased or absent breath sounds. Musculoskeletal: Full range of motion to all extremities. No gross deformities appreciated. Neurologic:  Normal speech and language. No gross focal neurologic deficits are appreciated.  Skin:  Skin is warm, dry and intact. No rash noted. Psychiatric: Mood and affect are normal. Speech and behavior are normal. Patient exhibits appropriate insight and judgement.   ____________________________________________   LABS (all labs ordered are listed, but only abnormal results are displayed)  Labs Reviewed - No data to display ____________________________________________  EKG   ____________________________________________  RADIOLOGY   No results found.  ____________________________________________    PROCEDURES  Procedure(s) performed:    Procedures    Medications - No data to display   ____________________________________________   INITIAL IMPRESSION / ASSESSMENT AND PLAN / ED COURSE  Pertinent labs & imaging results that were available during my care of the patient were reviewed by me and considered in my medical decision making (see chart for details).  Review of the [place] CSRS was performed in accordance of the [place] prior to dispensing any controlled drugs.           Patient's diagnosis is consistent with nasal congestion.  Patient presents to the emergency department stating that "could not breathe last night" due to his nasal congestion.  He states "if 1 side is plugged up and cannot sleep."   Patient states that he has been using Zicam without relief.  No other medications.  Patient denies any other complaints at this time.  Exam is reassuring.  No indication for labs or imaging currently.  Patient will have a prescription for hydroxyzine which she has used in the past with good success with his congestion  and inability to sleep due to his congestion.  Follow-up with primary care as needed.Patient is given ED precautions to return to the ED for any worsening or new symptoms.     ____________________________________________  FINAL CLINICAL IMPRESSION(S) / ED DIAGNOSES  Final diagnoses:  Nasal congestion      NEW MEDICATIONS STARTED DURING THIS VISIT:  ED Discharge Orders          Ordered    hydrOXYzine (VISTARIL) 100 MG capsule  3 times daily PRN        07/28/21 1850                This chart was dictated using voice recognition software/Dragon. Despite best efforts to proofread, errors can occur which can change the meaning. Any change was purely unintentional.    Darletta Moll, PA-C 07/28/21 Yevette Edwards    Nance Pear, MD 07/28/21 [phone]

## 2021-07-28 NOTE — Telephone Encounter (Signed)
No outgoing mail returned forms  -- Miguel [name] - Monday, July 28, 2021 3:21 PM --Patient has not returned [employer] form that was mailed to him 04/21/21--Closing file.

## 2021-07-28 NOTE — ED Triage Notes (Signed)
Pt comes into  the ED via EMS from home with c/o SOB since 2am, used his inhaler. Pt is in NAD, respirations WNL, lungs clear.  120/70 95%RA 95HR

## 2021-07-28 NOTE — ED Notes (Signed)
See triage note  presents with some SOB and nasal congestion  denies any fever

## 2021-07-30 ENCOUNTER — Other Ambulatory Visit: Payer: Self-pay

## 2021-08-01 ENCOUNTER — Other Ambulatory Visit: Payer: Self-pay

## 2021-08-01 ENCOUNTER — Emergency Department: Payer: Self-pay

## 2021-08-01 ENCOUNTER — Emergency Department
Admission: EM | Admit: 2021-08-01 | Discharge: 2021-08-01 | Disposition: A | Discharge status: Home / Self Care | Payer: Self-pay | Attending: Emergency Medicine | Admitting: Emergency Medicine

## 2021-08-01 DIAGNOSIS — Z4802 Encounter for removal of sutures: Secondary | ICD-10-CM | POA: Insufficient documentation

## 2021-08-01 DIAGNOSIS — R519 Headache, unspecified: Secondary | ICD-10-CM | POA: Insufficient documentation

## 2021-08-01 DIAGNOSIS — I10 Essential (primary) hypertension: Secondary | ICD-10-CM | POA: Insufficient documentation

## 2021-08-01 DIAGNOSIS — W19XXXA Unspecified fall, initial encounter: Secondary | ICD-10-CM | POA: Insufficient documentation

## 2021-08-01 DIAGNOSIS — R059 Cough, unspecified: Secondary | ICD-10-CM | POA: Insufficient documentation

## 2021-08-01 DIAGNOSIS — M25511 Pain in right shoulder: Secondary | ICD-10-CM | POA: Insufficient documentation

## 2021-08-01 DIAGNOSIS — J45909 Unspecified asthma, uncomplicated: Secondary | ICD-10-CM | POA: Insufficient documentation

## 2021-08-01 DIAGNOSIS — Z79899 Other long term (current) drug therapy: Secondary | ICD-10-CM | POA: Insufficient documentation

## 2021-08-01 DIAGNOSIS — Z87891 Personal history of nicotine dependence: Secondary | ICD-10-CM | POA: Insufficient documentation

## 2021-08-01 DIAGNOSIS — M542 Cervicalgia: Secondary | ICD-10-CM | POA: Insufficient documentation

## 2021-08-01 MED ORDER — ACETAMINOPHEN 325 MG PO TABS
650.0000 mg | ORAL_TABLET | Freq: Once | ORAL | Status: AC
  Administered 2021-08-01: 650 mg via ORAL
  Filled 2021-08-01: qty 2

## 2021-08-01 MED ORDER — DOXYCYCLINE HYCLATE 100 MG PO CAPS
100.0000 mg | ORAL_CAPSULE | Freq: Two times a day (BID) | ORAL | 0 refills | Status: AC
  Filled 2021-08-01: qty 14, 7d supply, fill #0

## 2021-08-01 NOTE — ED Provider Notes (Signed)
 [facility] Emergency Department Provider Note  ____________________________________________   Event Date/Time   First MD Initiated Contact with Patient 08/01/21 1831     (approximate)  I have reviewed the triage vital signs and the nursing notes.   HISTORY  Chief Complaint Fall    HPI Miguel [name] is a 59 y.o. male  here with fall. Pt is somewhat unclear on history as he had been drinking throughout the day, btu reports he's had some right shoulder, neck and head pain after recent fall. Believes it was in last 24 hours. Pain is mild, aching, worse w/ movement and palpation. No alleviating factors. He would also like staples on his posterior scalp removed. No blood thinner use. No CP, abd pain, n/v/d.       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    Right ankle pain 06/26/2020   Family history of breast cancer    Hx of colonic polyps    Hospital discharge follow-up 06/05/2020   Kidney function test abnormal 06/05/2020   Abdominal pain 04/12/2020   Hypertensive urgency 04/12/2020   Alcohol withdrawal (HCC) 03/04/2020   Right-sided chest wall pain 02/28/2020   Alcohol abuse with alcohol-induced mood disorder (HCC) 12/02/2019   Moderate episode of recurrent major depressive disorder (HCC)    Health care maintenance 10/26/2019   Right knee pain 10/26/2019   Generalized anxiety disorder 10/14/2019   MDD (major depressive disorder), recurrent severe, without psychosis (HCC) 07/27/2019    Social anxiety disorder 07/27/2019   Left-sided chest wall pain 06/14/2019   Alcoholic cirrhosis of liver without ascites (HCC) 06/14/2019   Alcohol abuse 06/14/2019   AKI (acute kidney injury) (HCC) 05/27/2019   Alcohol withdrawal syndrome without complication (HCC) 05/07/2019   Alcoholic intoxication without complication (HCC)    Sepsis (HCC) 03/08/2019   Breast lump or mass 10/04/2018   Sepsis secondary to UTI (HCC) 09/14/2018   Asthma 07/07/2018   GERD (gastroesophageal reflux disease) 07/07/2018   OCD (obsessive compulsive disorder) 07/07/2018   Self-inflicted laceration of left wrist (HCC) 03/10/2018   Leg hematoma 12/25/2017   Suicide and self-inflicted injury by cutting and piercing instrument (HCC) 03/10/2017   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   Substance induced mood disorder (HCC) 08/15/2016   Involuntary commitment 08/15/2016   Alcohol use disorder, severe, dependence (HCC) 02/05/2016   Hypertension 12/05/2015   Tachycardia 12/05/2015   Gout 11/13/2015   Chronic back pain 05/02/2015    Past Surgical History:  Procedure Laterality Date   CHOLECYSTECTOMY  2012   COLONOSCOPY     COLONOSCOPY WITH PROPOFOL N/A 05/28/2020   Procedure: COLONOSCOPY WITH PROPOFOL;  Surgeon: Anna, Kiran, MD;  Location: [facility] ENDOSCOPY;  Service: Gastroenterology;  Laterality: N/A;   EXTRACORPOREAL SHOCK WAVE LITHOTRIPSY Left 01/12/2019   Procedure: EXTRACORPOREAL SHOCK WAVE LITHOTRIPSY (ESWL);  Surgeon: Sninsky, Brian C, MD;    Location: [facility] ORS;  Service: Urology;  Laterality: Left;   UPPER GI ENDOSCOPY      Prior to Admission medications   Medication Sig Start Date End Date Taking? Authorizing Provider  doxycycline (VIBRAMYCIN) 100 MG capsule Take 1 capsule (100 mg total) by mouth 2 (two) times daily for 7 days. 08/01/21 08/08/21 Yes Chrishawn Boley, MD  ADVAIR DISKUS 100-50 MCG/ACT AEPB INHALE 1 PUFF 2 TIMES A DAY. RINSE MOUTH AND SPIT AFTER EACH  USE. 06/12/20 06/19/21  Chaplin, Jehan C, MD  allopurinol (ZYLOPRIM) 300 MG tablet TAKE ONE TABLET BY MOUTH EVERY DAY 04/15/21 11/01/21  Iloabachie, Chioma E, NP  ascorbic acid (VITAMIN C) 500 MG tablet Take by mouth. 08/01/19   [provider]  fluticasone (FLONASE) 50 MCG/ACT nasal spray SPRAY ONE SPRAY INTO BOTH NOSTRILS ONCE DAILY. 05/16/21 05/16/22  Woods, Jaclyn M, PA-C  folic acid (FOLVITE) 1 MG tablet TAKE ONE TABLET BY MOUTH EVERY DAY 05/21/21 05/21/22  Iloabachie, Chioma E, NP  HYDROcodone-acetaminophen (NORCO/VICODIN) 5-325 MG tablet Take 1 tablet by mouth every 6 (six) hours as needed. 07/02/21   Ward, Kristen N, DO  hydrOXYzine (ATARAX/VISTARIL) 10 MG tablet Take 1 tablet (10 mg total) by mouth at bedtime as needed (sleep). 06/23/21   Robinson, Patrick, MD  hydrOXYzine (VISTARIL) 100 MG capsule Take 1 capsule (100 mg total) by mouth 3 (three) times daily as needed (congestion). 07/28/21   Cuthriell, Jonathan D, PA-C  ibuprofen (ADVIL) 800 MG tablet Take 1 tablet (800 mg total) by mouth every 8 (eight) hours as needed for mild pain. 07/02/21   Ward, Kristen N, DO  lisinopril (ZESTRIL) 40 MG tablet TAKE ONE TABLET BY MOUTH EVERY DAY 04/15/21 04/15/22  Iloabachie, Chioma E, NP  loratadine (CLARITIN) 10 MG tablet Take 10 mg by mouth daily.    [provider]  metoprolol tartrate (LOPRESSOR) 25 MG tablet Take 1 tablet (25 mg total) by mouth once daily. 05/21/21 06/25/21  Iloabachie, Chioma E, NP  mirtazapine (REMERON) 15 MG tablet Take 1 tablet (15 mg total) by mouth at bedtime. 05/21/21 06/25/21  Iloabachie, Chioma E, NP  pantoprazole (PROTONIX) 40 MG tablet Take 1 tablet (40 mg total) by mouth once daily. 06/28/21 06/28/22  Smith, Zachary P, MD  pseudoephedrine (SUDAFED) 120 MG 12 hr tablet Take 120 mg by mouth daily.    [provider]  thiamine (VITAMIN B-1) 100 MG tablet TAKE ONE TABLET BY MOUTH EVERY DAY 10/22/20 10/22/21  Iloabachie, Chioma E, NP  albuterol (VENTOLIN HFA) 108 (90 Base)  MCG/ACT inhaler Inhale 2 puffs into the lungs every 4 (four) hours as needed. 05/27/21 05/27/22  Iloabachie, Chioma E, NP    Allergies Patient has no known allergies.  Family History  Problem Relation Age of Onset   Alcohol abuse Father    Breast cancer Mother 70   Anxiety disorder Brother    Other Maternal Grandmother        unknown medical history   Other Maternal Grandfather        unknow medical history   Other Paternal Grandmother        unknown medical history   Other Paternal Grandfather        unknown medical history   Healthy Brother     Social History Social History   Tobacco Use   Smoking status: Former    Types: Cigarettes    Quit date: 03/19/1981    Years since quitting: 40.3   Smokeless tobacco: Never  Vaping Use   Vaping   Use: Never used  Substance Use Topics   Alcohol use: Yes    Alcohol/week: 24.0 standard drinks    Types: 24 Shots of liquor per week    Comment: LAst drink Friday 06/20/21   Drug use: Yes    Types: Marijuana    Review of Systems  Review of Systems  Constitutional:  Negative for chills and fever.  HENT:  Negative for sore throat.   Respiratory:  Negative for shortness of breath.   Cardiovascular:  Negative for chest pain.  Gastrointestinal:  Negative for abdominal pain.  Genitourinary:  Negative for flank pain.  Musculoskeletal:  Positive for arthralgias. Negative for neck pain.  Skin:  Negative for rash and wound.  Allergic/Immunologic: Negative for immunocompromised state.  Neurological:  Positive for headaches. Negative for weakness and numbness.  Hematological:  Does not bruise/bleed easily.    ____________________________________________  PHYSICAL EXAM:      VITAL SIGNS: ED Triage Vitals  Enc Vitals Group     BP 08/01/21 1719 100/67     Pulse Rate 08/01/21 1719 85     Resp 08/01/21 1719 18     Temp 08/01/21 1719 97.9 F (36.6 C)     Temp Source 08/01/21 1719 Oral     SpO2 08/01/21 1719 95 %     Weight --      Height  08/01/21 1720 6' (1.829 m)     Head Circumference --      Peak Flow --      Pain Score 08/01/21 1720 8     Pain Loc --      Pain Edu? --      Excl. in GC? --      Physical Exam Vitals and nursing note reviewed.  Constitutional:      General: He is not in acute distress.    Appearance: He is well-developed.  HENT:     Head: Normocephalic and atraumatic.     Comments: Healing laceration to posterior scalp with staples in place. Mild diffuse TTP but no focal ttp or hematoma. Eyes:     Conjunctiva/sclera: Conjunctivae normal.  Cardiovascular:     Rate and Rhythm: Normal rate and regular rhythm.     Heart sounds: Normal heart sounds.  Pulmonary:     Effort: Pulmonary effort is normal. No respiratory distress.     Breath sounds: No wheezing.  Abdominal:     General: There is no distension.  Musculoskeletal:     Cervical back: Neck supple.     Comments: Mild R paraspinal TTP, mild TTP over R shoulder  Skin:    General: Skin is warm.     Capillary Refill: Capillary refill takes less than 2 seconds.     Findings: No rash.  Neurological:     Mental Status: He is alert and oriented to person, place, and time.     Motor: No abnormal muscle tone.      ____________________________________________   LABS (all labs ordered are listed, but only abnormal results are displayed)  Labs Reviewed - No data to display  ____________________________________________  EKG:  ________________________________________  RADIOLOGY All imaging, including plain films, CT scans, and ultrasounds, independently reviewed by me, and interpretations confirmed via formal radiology reads.  ED MD interpretation:   DG Right Shoulder: no acute abnormality  Official radiology report(s): DG Ribs Unilateral W/Chest Right  Result Date: 08/01/2021 CLINICAL DATA:  Chest pain after a fall several hours ago. Alcohol and substance use. Right-sided chest pain. EXAM: RIGHT RIBS AND   CHEST - 3+ VIEW COMPARISON:   07/28/2021 FINDINGS: Shallow inspiration. Infiltration or atelectasis in the left base is similar to prior study. No pleural effusions. No pneumothorax. Mediastinal contours appear intact. The right ribs appear intact. No acute displaced fractures identified. No focal bone lesion or bone destruction. Soft tissues are unremarkable. IMPRESSION: Persistent infiltration or atelectasis in the left lung base without change. Negative right ribs. Electronically Signed   By: Lucienne Capers M.D.   On: 08/01/2021 19:21   DG Shoulder Right  Result Date: 08/01/2021 CLINICAL DATA:  Fall EXAM: RIGHT SHOULDER - 2+ VIEW COMPARISON:  11/09/2020, FINDINGS: AC joint is intact. No fracture or malalignment. Right lung apex is clear IMPRESSION: No acute osseous abnormality Electronically Signed   By: Donavan Foil M.D.   On: 08/01/2021 18:14   CT HEAD WO CONTRAST (5MM)  Result Date: 08/01/2021 CLINICAL DATA:  Head trauma, focal neuro findings (Age 68-64y) EXAM: CT HEAD WITHOUT CONTRAST TECHNIQUE: Contiguous axial images were obtained from the base of the skull through the vertex without intravenous contrast. COMPARISON:  07/22/2021 FINDINGS: Brain: There is no acute intracranial hemorrhage, mass effect, or edema. Gray-white differentiation is preserved. There is no extra-axial fluid collection. Ventricles and sulci are stable in size and configuration. Vascular: No hyperdense vessel or unexpected calcification. Skull: Calvarium is unremarkable. Sinuses/Orbits: No acute finding. Other: None. IMPRESSION: No evidence of acute intracranial injury. No significant change since recent prior study. Electronically Signed   By: Macy Mis M.D.   On: 08/01/2021 19:31   CT Cervical Spine Wo Contrast  Result Date: 08/01/2021 CLINICAL DATA:  Neck trauma, dangerous injury mechanism (Age 17-64y) EXAM: CT CERVICAL SPINE WITHOUT CONTRAST TECHNIQUE: Multidetector CT imaging of the cervical spine was performed without intravenous contrast.  Multiplanar CT image reconstructions were also generated. COMPARISON:  07/22/2021 FINDINGS: Alignment: Stable. Skull base and vertebrae: Stable vertebral body heights. No acute fracture. Soft tissues and spinal canal: No prevertebral fluid or swelling. No visible canal hematoma. Disc levels:  Stable appearance with minor degenerative changes. Upper chest: No new finding. Other: None. IMPRESSION: No acute cervical spine fracture. Electronically Signed   By: Macy Mis M.D.   On: 08/01/2021 19:34    ____________________________________________  PROCEDURES   Procedure(s) performed (including Critical Care):  .Suture Removal  Date/Time: 08/01/2021 7:12 PM Performed by: Duffy Bruce, MD Authorized by: Duffy Bruce, MD   Consent:    Consent obtained:  Verbal Universal protocol:    Patient identity confirmed:  Verbally with patient Location:    Location:  [place] location:  [place]p Procedure details:    Wound appearance:  No signs of infection   Number of staples removed:  6 Post-procedure details:    Post-removal:  Antibiotic ointment applied   Procedure completion:  Tolerated well, no immediate complications  ____________________________________________  INITIAL IMPRESSION / MDM / [place] / ED COURSE  As part of my medical decision making, I reviewed the following data within the [place] notes reviewed and incorporated, Old chart reviewed, Notes from prior ED visits, and [place] Controlled Substance Database       *Miguel [name] was evaluated in Emergency Department on 08/01/2021 for the symptoms described in the history of present illness. He was evaluated in the context of the global COVID-19 pandemic, which necessitated consideration that the patient might be at risk for infection with the SARS-CoV-2 virus that causes COVID-19. Institutional protocols and algorithms that pertain to the evaluation of patients at  risk for COVID-19  are in a state of rapid change based on information released by regulatory bodies including the CDC and federal and state organizations. These policies and algorithms were followed during the patient's care in the ED.  Some ED evaluations and interventions may be delayed as a result of limited staffing during the pandemic.*     Medical Decision Making:  60 yo M here with R shoulder pain, mild HA, R paraspinal neck pain after fall. Pt has extensive h/o falls in setting of EtOH use. Admits to EtOH use tonight but is ambulatory, ambulate to hold conversations. CT imaging, plain films obtained, reviewed as above. No acute traumatic injury. He otherwise appears at his baseline. He did have staples placed 10 d ago which were removed - wound is c/d/I.  CXR read as persistent infiltrate vs atelectasis. Pt satting well on RA in NAD. No signs of sepsis. He does, however, endorse +congestion, sputum production. H/o smoking. Will tx with doxy in event of early CAP, otherwise pt HDS and no apparent signs of sepsis, resp distress, or indication for admission.  ____________________________________________  FINAL CLINICAL IMPRESSION(S) / ED DIAGNOSES  Final diagnoses:  Fall, initial encounter  Encounter for staple removal  Cough     MEDICATIONS GIVEN DURING THIS VISIT:  Medications  acetaminophen (TYLENOL) tablet 650 mg (650 mg Oral Given 08/01/21 1937)     ED Discharge Orders          Ordered    doxycycline (VIBRAMYCIN) 100 MG capsule  2 times daily        08/01/21 1927             Note:  This document was prepared using Dragon voice recognition software and may include unintentional dictation errors.   Duffy Bruce, MD 08/01/21 2125

## 2021-08-01 NOTE — ED Triage Notes (Signed)
See first nurse note- Pt to ER via [facility] after falling "a couple times three days ago", reports right shoulder pain and left sided lower back pain. Pt with large healing abrasion present to right shoulder.

## 2021-08-01 NOTE — ED Triage Notes (Signed)
First Nurse Note:  Arrives via [facility] for ED evaluation for c/o head and neck pain after falling  hours ago.  Admits to drinking a fifth of alcohol and smoking marijuana today.  VS wnl.

## 2021-08-04 ENCOUNTER — Encounter: Payer: Self-pay | Admitting: Emergency Medicine

## 2021-08-04 ENCOUNTER — Emergency Department: Payer: Medicaid Other

## 2021-08-04 ENCOUNTER — Other Ambulatory Visit: Payer: Self-pay

## 2021-08-04 ENCOUNTER — Emergency Department
Admission: EM | Admit: 2021-08-04 | Discharge: 2021-08-04 | Disposition: A | Discharge status: Home / Self Care | Payer: Medicaid Other | Attending: Emergency Medicine | Admitting: Emergency Medicine

## 2021-08-04 DIAGNOSIS — S0990XA Unspecified injury of head, initial encounter: Secondary | ICD-10-CM

## 2021-08-04 DIAGNOSIS — Z87891 Personal history of nicotine dependence: Secondary | ICD-10-CM | POA: Insufficient documentation

## 2021-08-04 DIAGNOSIS — W0110XA Fall on same level from slipping, tripping and stumbling with subsequent striking against unspecified object, initial encounter: Secondary | ICD-10-CM | POA: Insufficient documentation

## 2021-08-04 DIAGNOSIS — I10 Essential (primary) hypertension: Secondary | ICD-10-CM | POA: Insufficient documentation

## 2021-08-04 DIAGNOSIS — Z7951 Long term (current) use of inhaled steroids: Secondary | ICD-10-CM | POA: Insufficient documentation

## 2021-08-04 DIAGNOSIS — Y92008 Other place in unspecified non-institutional (private) residence as the place of occurrence of the external cause: Secondary | ICD-10-CM | POA: Insufficient documentation

## 2021-08-04 DIAGNOSIS — J45909 Unspecified asthma, uncomplicated: Secondary | ICD-10-CM | POA: Insufficient documentation

## 2021-08-04 DIAGNOSIS — S069X1A Unspecified intracranial injury with loss of consciousness of 30 minutes or less, initial encounter: Secondary | ICD-10-CM | POA: Insufficient documentation

## 2021-08-04 DIAGNOSIS — Z79899 Other long term (current) drug therapy: Secondary | ICD-10-CM | POA: Insufficient documentation

## 2021-08-04 NOTE — ED Notes (Signed)
First nurse-Pt brought in via ems from home with a fall.  Pt fell on a wooden ramp  hx etoh abuse.  Pt alert

## 2021-08-04 NOTE — ED Triage Notes (Signed)
Pt to triage via w/c with no distress noted; pt reports tripped on ramp and fell hitting door; +ETOH; st hit back of head with +LOC; c/o HA but no other c/o

## 2021-08-04 NOTE — ED Provider Notes (Signed)
[facility] Emergency Department Provider Note ____________________________________________   Event Date/Time   First MD Initiated Contact with Patient 08/04/21 2030     (approximate)  I have reviewed the triage vital signs and the nursing notes.   HISTORY  Chief Complaint Fall  HPI Miguel [name] is a 60 y.o. male with history of alcohol abuse, and history as listed below presents to the emergency department for treatment and evaluation after slipping on the wheelchair ramp and hitting his head. He lose consciousness for a few seconds and now has a headache.          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    Right ankle pain 06/26/2020   Family history of breast cancer    Hx of colonic polyps    Hospital discharge follow-up 06/05/2020   Kidney function test abnormal 06/05/2020   Abdominal pain 04/12/2020   Hypertensive urgency 04/12/2020   Alcohol withdrawal ([place]) 03/04/2020   Right-sided chest wall pain 02/28/2020   Alcohol abuse with alcohol-induced mood disorder ([place]) 12/02/2019   Moderate episode of recurrent major depressive disorder ([place])    Health care maintenance 10/26/2019   Right knee pain 10/26/2019   Generalized anxiety disorder 10/14/2019   MDD (major depressive disorder), recurrent severe, without psychosis ([place]) 07/27/2019   Social anxiety disorder 07/27/2019   Left-sided chest wall pain 82/99/3716   Alcoholic cirrhosis of liver without ascites ([place]) 06/14/2019    Alcohol abuse 06/14/2019   AKI (acute kidney injury) ([place]) 05/27/2019   Alcohol withdrawal syndrome without complication ([place]) 96/78/9381   Alcoholic intoxication without complication ([place])    Sepsis ([place]) 03/08/2019   Breast lump or mass 10/04/2018   Sepsis secondary to UTI ([place]) 09/14/2018   Asthma 07/07/2018   GERD (gastroesophageal reflux disease) 07/07/2018   OCD (obsessive compulsive disorder) 01/75/1025   Self-inflicted laceration of left wrist ([place]) 03/10/2018   Leg hematoma 12/25/2017   Suicide and self-inflicted injury by cutting and piercing instrument ([place]) 03/10/2017   Severe recurrent major 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 ([place]) 03/09/2017   Substance induced mood disorder ([place]) 08/15/2016   Involuntary commitment 08/15/2016   Alcohol use disorder, severe, dependence ([place]) 02/05/2016   Hypertension 12/05/2015   Tachycardia 12/05/2015   Gout 11/13/2015   Chronic back pain 05/02/2015    Past Surgical History:  Procedure Laterality Date   CHOLECYSTECTOMY  2012   COLONOSCOPY     COLONOSCOPY WITH PROPOFOL N/A 05/28/2020   Procedure: COLONOSCOPY WITH PROPOFOL;  Surgeon: [name], MD;  Location: [facility] ENDOSCOPY;  Service: Gastroenterology;  Laterality: N/A;   EXTRACORPOREAL SHOCK WAVE LITHOTRIPSY Left 01/12/2019   Procedure: EXTRACORPOREAL SHOCK WAVE LITHOTRIPSY (ESWL);  Surgeon: Billey Co, MD;  Location: [facility] ORS;  Service: Urology;  Laterality: Left;   UPPER GI ENDOSCOPY      Prior to Admission medications   Medication Sig Start Date  End Date Taking? Authorizing Provider  ADVAIR DISKUS 100-50 MCG/ACT AEPB INHALE 1 PUFF 2 TIMES A DAY. RINSE MOUTH AND SPIT AFTER EACH USE. 06/12/20 06/19/21  Tawni Millers, MD  allopurinol (ZYLOPRIM) 300 MG tablet TAKE ONE TABLET BY MOUTH EVERY DAY 04/15/21 11/01/21  Iloabachie, Chioma E, NP  ascorbic acid (VITAMIN C) 500 MG tablet Take by mouth. 08/01/19   [provider]  doxycycline (VIBRAMYCIN) 100 MG capsule Take 1  capsule (100 mg total) by mouth 2 (two) times daily for 7 days. 08/01/21 08/11/21  Duffy Bruce, MD  fluticasone (FLONASE) 50 MCG/ACT nasal spray SPRAY ONE SPRAY INTO BOTH NOSTRILS ONCE DAILY. 05/16/21 05/16/22  Lannie Fields, PA-C  folic acid (FOLVITE) 1 MG tablet TAKE ONE TABLET BY MOUTH EVERY DAY 05/21/21 05/21/22  Iloabachie, Chioma E, NP  HYDROcodone-acetaminophen (NORCO/VICODIN) 5-325 MG tablet Take 1 tablet by mouth every 6 (six) hours as needed. 07/02/21   Ward, Delice Bison, DO  hydrOXYzine (ATARAX/VISTARIL) 10 MG tablet Take 1 tablet (10 mg total) by mouth at bedtime as needed (sleep). 06/23/21   Merlyn Lot, MD  hydrOXYzine (VISTARIL) 100 MG capsule Take 1 capsule (100 mg total) by mouth 3 (three) times daily as needed (congestion). 07/28/21   Cuthriell, Charline Bills, PA-C  ibuprofen (ADVIL) 800 MG tablet Take 1 tablet (800 mg total) by mouth every 8 (eight) hours as needed for mild pain. 07/02/21   Ward, Delice Bison, DO  lisinopril (ZESTRIL) 40 MG tablet TAKE ONE TABLET BY MOUTH EVERY DAY 04/15/21 04/15/22  Iloabachie, Chioma E, NP  loratadine (CLARITIN) 10 MG tablet Take 10 mg by mouth daily.    [provider]  metoprolol tartrate (LOPRESSOR) 25 MG tablet Take 1 tablet (25 mg total) by mouth once daily. 05/21/21 06/25/21  Iloabachie, Chioma E, NP  mirtazapine (REMERON) 15 MG tablet Take 1 tablet (15 mg total) by mouth at bedtime. 05/21/21 06/25/21  Iloabachie, Chioma E, NP  pantoprazole (PROTONIX) 40 MG tablet Take 1 tablet (40 mg total) by mouth once daily. 06/28/21 06/28/22  [name], MD  pseudoephedrine (SUDAFED) 120 MG 12 hr tablet Take 120 mg by mouth daily.    [provider]  thiamine (VITAMIN B-1) 100 MG tablet TAKE ONE TABLET BY MOUTH EVERY DAY 10/22/20 10/22/21  Iloabachie, Chioma E, NP  albuterol (VENTOLIN HFA) 108 (90 Base) MCG/ACT inhaler Inhale 2 puffs into the lungs every 4 (four) hours as needed. 05/27/21 05/27/22  Iloabachie, Chioma E, NP    Allergies Patient has no  known allergies.  Family History  Problem Relation Age of Onset   Alcohol abuse Father    Breast cancer Mother 29   Anxiety disorder Brother    Other Maternal Grandmother        unknown medical history   Other Maternal Grandfather        unknow medical history   Other Paternal Grandmother        unknown medical history   Other Paternal Grandfather        unknown medical history   Healthy Brother     Social History Social History   Tobacco Use   Smoking status: Former    Types: Cigarettes    Quit date: 03/19/1981    Years since quitting: 40.4   Smokeless tobacco: Never  Vaping Use   Vaping Use: Never used  Substance Use Topics   Alcohol use: Yes    Alcohol/week: 24.0 standard drinks    Types: 24 Shots of liquor per week  Comment: LAst drink Friday 06/20/21   Drug use: Yes    Types: Marijuana    Review of Systems  Constitutional: No fever/chills Eyes: No visual changes. ENT: No sore throat. Cardiovascular: Denies chest pain. Respiratory: Denies shortness of breath. Gastrointestinal: No abdominal pain.  No nausea, no vomiting.  No diarrhea.  No constipation. Genitourinary: Negative for dysuria. Musculoskeletal: Negative for back pain. Skin: Negative for rash. Neurological: Positive for headaches, focal weakness or numbness. ____________________________________________   PHYSICAL EXAM:  VITAL SIGNS: ED Triage Vitals  Enc Vitals Group     BP 08/04/21 1840 126/82     Pulse Rate 08/04/21 1840 88     Resp 08/04/21 1840 18     Temp 08/04/21 1840 97.8 F (36.6 C)     Temp Source 08/04/21 1840 Oral     SpO2 08/04/21 1840 98 %     Weight 08/04/21 1918 190 lb (86.2 kg)     Height 08/04/21 1918 6' (1.829 m)     Head Circumference --      Peak Flow --      Pain Score 08/04/21 1917 8     Pain Loc --      Pain Edu? --      Excl. in [place]? --     Constitutional: Alert and oriented. Well appearing and in no acute distress. Eyes: Conjunctivae are normal.  Head:  Atraumatic. Nose: No congestion/rhinnorhea. Mouth/Throat: Mucous membranes are moist.  Oropharynx non-erythematous. Neck: No stridor.   Hematological/Lymphatic/Immunilogical: No cervical lymphadenopathy. Cardiovascular: Normal rate, regular rhythm. Grossly normal heart sounds.  Good peripheral circulation. Respiratory: Normal respiratory effort.  No retractions. Lungs CTAB. Gastrointestinal: Soft and nontender. No distention. No abdominal bruits. Genitourinary:  Musculoskeletal: No lower extremity tenderness nor edema.  No joint effusions. Neurologic:  Normal speech and language. No gross focal neurologic deficits are appreciated. No gait instability. Skin:  Skin is warm, dry and intact. No rash noted. Psychiatric: Mood and affect are normal. Speech and behavior are normal.  ____________________________________________   LABS (all labs ordered are listed, but only abnormal results are displayed)  Labs Reviewed - No data to display ____________________________________________  EKG  Not indicated. ____________________________________________  RADIOLOGY  ED MD interpretation:    CT head without acute concerns.  I, Sherrie George, personally viewed and evaluated these images (plain radiographs) as part of my medical decision making, as well as reviewing the written report by the radiologist.  Official radiology report(s): CT HEAD WO CONTRAST (5MM)  Result Date: 08/04/2021 CLINICAL DATA:  Minor head trauma. Trip and fall injury. Struck back of head. Loss of consciousness. Headache. EXAM: CT HEAD WITHOUT CONTRAST TECHNIQUE: Contiguous axial images were obtained from the base of the skull through the vertex without intravenous contrast. COMPARISON:  08/01/2021 FINDINGS: Brain: No evidence of acute infarction, hemorrhage, hydrocephalus, extra-axial collection or mass lesion/mass effect. Vascular: No hyperdense vessel or unexpected calcification. Skull: Normal. Negative for fracture or  focal lesion. Sinuses/Orbits: Mucosal thickening in the paranasal sinuses. No acute air-fluid levels. Mastoid air cells are clear. Other: No significant changes since the previous study. IMPRESSION: No acute intracranial abnormalities. Electronically Signed   By: Lucienne Capers M.D.   On: 08/04/2021 20:18    ____________________________________________   PROCEDURES  Procedure(s) performed (including Critical Care):  Procedures  ____________________________________________   INITIAL IMPRESSION / ASSESSMENT AND PLAN     60 year old male presenting to the emergency department for treatment and evaluation after slipping on a wheelchair ramp and hitting the back of his head.  Patient states the ramp was slick because after mowing the grass no one swept it off.   ED COURSE  CT head without acute concerns.  Patient is alert and oriented.  He will be discharged home.  Head injury instructions were provided.    ___________________________________________   FINAL CLINICAL IMPRESSION(S) / ED DIAGNOSES  Final diagnoses:  Minor head injury, initial encounter     ED Discharge Orders     None        KNOXX BOEDING was evaluated in Emergency Department on 08/04/2021 for the symptoms described in the history of present illness. He was evaluated in the context of the global COVID-19 pandemic, which necessitated consideration that the patient might be at risk for infection with the SARS-CoV-2 virus that causes COVID-19. Institutional protocols and algorithms that pertain to the evaluation of patients at risk for COVID-19 are in a state of rapid change based on information released by regulatory bodies including the CDC and federal and state organizations. These policies and algorithms were followed during the patient's care in the ED.   Note:  This document was prepared using Dragon voice recognition software and may include unintentional dictation errors.    Victorino Dike,  FNP 08/04/21 2104    Nance Pear, MD 08/04/21 2158

## 2021-08-04 NOTE — ED Notes (Signed)
Pt in CT @ this time.

## 2021-08-08 ENCOUNTER — Emergency Department: Payer: Medicaid Other

## 2021-08-08 ENCOUNTER — Emergency Department
Admission: EM | Admit: 2021-08-08 | Discharge: 2021-08-09 | Disposition: A | Discharge status: Home / Self Care | Payer: Medicaid Other | Attending: Emergency Medicine | Admitting: Emergency Medicine

## 2021-08-08 DIAGNOSIS — Z7951 Long term (current) use of inhaled steroids: Secondary | ICD-10-CM | POA: Insufficient documentation

## 2021-08-08 DIAGNOSIS — Z79899 Other long term (current) drug therapy: Secondary | ICD-10-CM | POA: Insufficient documentation

## 2021-08-08 DIAGNOSIS — I10 Essential (primary) hypertension: Secondary | ICD-10-CM | POA: Insufficient documentation

## 2021-08-08 DIAGNOSIS — Z87891 Personal history of nicotine dependence: Secondary | ICD-10-CM | POA: Insufficient documentation

## 2021-08-08 DIAGNOSIS — N3001 Acute cystitis with hematuria: Secondary | ICD-10-CM | POA: Insufficient documentation

## 2021-08-08 DIAGNOSIS — N23 Unspecified renal colic: Secondary | ICD-10-CM

## 2021-08-08 DIAGNOSIS — N200 Calculus of kidney: Secondary | ICD-10-CM | POA: Insufficient documentation

## 2021-08-08 DIAGNOSIS — R109 Unspecified abdominal pain: Secondary | ICD-10-CM

## 2021-08-08 DIAGNOSIS — J45909 Unspecified asthma, uncomplicated: Secondary | ICD-10-CM | POA: Insufficient documentation

## 2021-08-08 LAB — CBC WITH DIFFERENTIAL/PLATELET
Abs Immature Granulocytes: 0.03 10*3/uL (ref 0.00–0.07)
Basophils Absolute: 0.1 10*3/uL (ref 0.0–0.1)
Basophils Relative: 1 %
Eosinophils Absolute: 0.3 10*3/uL (ref 0.0–0.5)
Eosinophils Relative: 4 %
HCT: 38.7 % — ABNORMAL LOW (ref 39.0–52.0)
Hemoglobin: 13.1 g/dL (ref 13.0–17.0)
Immature Granulocytes: 0 %
Lymphocytes Relative: 15 %
Lymphs Abs: 1.4 10*3/uL (ref 0.7–4.0)
MCH: 29 pg (ref 26.0–34.0)
MCHC: 33.9 g/dL (ref 30.0–36.0)
MCV: 85.6 fL (ref 80.0–100.0)
Monocytes Absolute: 0.9 10*3/uL (ref 0.1–1.0)
Monocytes Relative: 9 %
Neutro Abs: 6.6 10*3/uL (ref 1.7–7.7)
Neutrophils Relative %: 71 %
Platelets: 243 10*3/uL (ref 150–400)
RBC: 4.52 MIL/uL (ref 4.22–5.81)
RDW: 17.4 % — ABNORMAL HIGH (ref 11.5–15.5)
WBC: 9.3 10*3/uL (ref 4.0–10.5)
nRBC: 0 % (ref 0.0–0.2)

## 2021-08-08 LAB — URINALYSIS, ROUTINE W REFLEX MICROSCOPIC
Glucose, UA: NEGATIVE mg/dL
Hgb urine dipstick: NEGATIVE
Ketones, ur: 5 mg/dL — AB
Nitrite: NEGATIVE
Protein, ur: 100 mg/dL — AB
Specific Gravity, Urine: 1.023 (ref 1.005–1.030)
WBC, UA: >50 WBC/hpf — ABNORMAL HIGH (ref 0–5)
pH: 5 (ref 5.0–8.0)

## 2021-08-08 LAB — COMPREHENSIVE METABOLIC PANEL
ALT: 16 U/L (ref 0–44)
AST: 14 U/L — ABNORMAL LOW (ref 15–41)
Albumin: 4.3 g/dL (ref 3.5–5.0)
Alkaline Phosphatase: 57 U/L (ref 38–126)
Anion gap: 9 (ref 5–15)
BUN: 15 mg/dL (ref 8–23)
CO2: 24 mmol/L (ref 22–32)
Calcium: 9.3 mg/dL (ref 8.9–10.3)
Chloride: 105 mmol/L (ref 98–111)
Creatinine, Ser: 1.82 mg/dL — ABNORMAL HIGH (ref 0.61–1.24)
GFR, Estimated: 42 mL/min — ABNORMAL LOW (ref >60)
Glucose, Bld: 118 mg/dL — ABNORMAL HIGH (ref 70–99)
Potassium: 3.3 mmol/L — ABNORMAL LOW (ref 3.5–5.1)
Sodium: 138 mmol/L (ref 135–145)
Total Bilirubin: 1.1 mg/dL (ref 0.3–1.2)
Total Protein: 7.2 g/dL (ref 6.5–8.1)

## 2021-08-08 MED ORDER — KETOROLAC TROMETHAMINE 30 MG/ML IJ SOLN
15.0000 mg | Freq: Once | INTRAMUSCULAR | Status: AC
  Administered 2021-08-08: 15 mg via INTRAVENOUS
  Filled 2021-08-08: qty 1

## 2021-08-08 MED ORDER — SODIUM CHLORIDE 0.9 % IV SOLN
1.0000 g | Freq: Once | INTRAVENOUS | Status: AC
  Administered 2021-08-08: 1 g via INTRAVENOUS
  Filled 2021-08-08: qty 10

## 2021-08-08 MED ORDER — LACTATED RINGERS IV BOLUS
1000.0000 mL | Freq: Once | INTRAVENOUS | Status: AC
  Administered 2021-08-08: 1000 mL via INTRAVENOUS

## 2021-08-08 MED ORDER — OXYCODONE-ACETAMINOPHEN 5-325 MG PO TABS
1.0000 | ORAL_TABLET | Freq: Once | ORAL | Status: AC
  Administered 2021-08-08: 1 via ORAL
  Filled 2021-08-08: qty 1

## 2021-08-08 NOTE — ED Notes (Signed)
MSE done, pending stone study   [name], MD 08/08/21 [phone]

## 2021-08-08 NOTE — ED Triage Notes (Signed)
Pt presents to ED with c/o of R sided flank pain that radiates to R groin area. Pt states HX of kidney stones. Pt denies injury or trauma. NAD noted.

## 2021-08-08 NOTE — ED Provider Notes (Signed)
Emergency Medicine Provider Triage Evaluation Note  Miguel [name] , a 60 y.o. male  was evaluated in triage.  Pt complains of R low back and R flank pain x 1 day.  Review of Systems  Positive: R low back pain and R flank pain Negative: Dysuria, fever, N/V/D  Physical Exam  BP (!) 141/93 (BP Location: Right Arm)   Pulse 90   Temp 98 F (36.7 C) (Oral)   Resp 19   SpO2 100%  Gen:   Awake, no distress   Resp:  Normal effort  MSK:   Moves extremities without difficulty  Other:  POS for R CVA TTP  Medical Decision Making  Medically screening exam initiated at 6:16 PM.  Appropriate orders placed.  [name] was informed that the remainder of the evaluation will be completed by another provider, this initial triage assessment does not replace that evaluation, and the importance of remaining in the ED until their evaluation is complete.     [name], MD 08/08/21 [phone]

## 2021-08-08 NOTE — ED Notes (Signed)
Pt here via EMS from home with c/o right sided pain worsening over the past 3 hours; HR 103; 98%; 98.2 temp; 171/87. N, V on scene, placed in wheelchair, NAD.

## 2021-08-09 MED ORDER — ONDANSETRON 4 MG PO TBDP: 4.0000 mg | ORAL_TABLET | Freq: Three times a day (TID) | ORAL | 0 refills | Status: DC | PRN

## 2021-08-09 MED ORDER — PHENYLEPHRINE HCL 0.5 % NA SOLN
1.0000 [drp] | Freq: Once | NASAL | Status: AC
  Administered 2021-08-09: 1 [drp] via NASAL
  Filled 2021-08-09: qty 15

## 2021-08-09 MED ORDER — IBUPROFEN 800 MG PO TABS: 800.0000 mg | ORAL_TABLET | Freq: Three times a day (TID) | ORAL | 0 refills | Status: DC | PRN

## 2021-08-09 MED ORDER — CEPHALEXIN 500 MG PO CAPS: 500.0000 mg | ORAL_CAPSULE | Freq: Three times a day (TID) | ORAL | 0 refills | Status: AC

## 2021-08-09 NOTE — Discharge Instructions (Addendum)
You have been seen in the Emergency Department (ED)  today for a urinary tract infection. ° °Most UTIs are caused by bacteria and need to be treated with antibiotics. It is important to complete your treatment so that the infection does not get worse. Take your antibiotics fully even if your symptoms start to get better after the first few doses. Drink PLENTY of fluids to help clear the infection. ° °Follow-up with your doctor or return to the ER immediately if your symptoms are getting worse, if you develop a fever, if you develop abdominal or flank pain, or if you start to vomit. Otherwise follow up with your doctor in 1 week if your symptoms are improving.  ° °When should you call for help?  °Call your doctor now or seek immediate medical care if:  °Symptoms such as a fever, chills, nausea, or vomiting get worse or happen for the first time.  °You have new pain in your back just below your rib cage. This is called flank pain.  °There is new blood or pus in your urine.  °You are not able to take or keep down your antibiotics. °Your symptoms are not getting better after 48 hours of antibiotic treatment ° °Watch closely for changes in your health, and be sure to contact your doctor if:  °You are not getting better after taking an antibiotic for 2 days.  °Your symptoms go away but then come back. ° ° °How can you care for yourself at home?  °Take your antibiotics as prescribed. Do not stop taking them just because you feel better. You need to take the full course of antibiotics.  °Take your medicines exactly as prescribed. Your doctor may have prescribed a medicine, such as phenazopyridine (Pyridium), to help relieve pain when you urinate. This turns your urine orange. You may stop taking it when your symptoms get better. But be sure to take all of your antibiotics, which treat the infection.  °Drink extra water and juices such as cranberry and blueberry juices for the next day or two. This will help make the urine  less concentrated and help wash out the bacteria causing the infection. (If you have kidney, heart, or liver disease and have to limit your fluids, talk with your doctor before you increase your fluid intake.)  °Avoid drinks that are carbonated or have caffeine. They can irritate the bladder.  °Urinate often. Try to empty your bladder each time.  °To relieve pain, take a hot bath or lay a heating pad (set on low) over your lower belly or genital area. Never go to sleep with a heating pad in place. ° °To help prevent UTIs  °Drink plenty of fluids, enough so that your urine is light yellow or clear like water. If you have kidney, heart, or liver disease and have to limit fluids, talk with your doctor before you increase the amount of fluids you drink.  °Urinate when you have the urge. Do not hold your urine for a long time. Urinate before you go to sleep.  °Keep your vagina/ penis clean. ° ° °

## 2021-08-09 NOTE — ED Provider Notes (Signed)
[facility] Emergency Department Provider Note  ____________________________________________  Time seen: Approximately 12:43 AM  I have reviewed the triage vital signs and the nursing notes.   HISTORY  Chief Complaint Flank Pain   HPI Miguel [name] is a 60 y.o. male history of alcohol abuse, anxiety, asthma, GERD, hypertension, kidney stones who presents for evaluation of right flank pain.  Patient reports that his symptoms started today.  He reports the pain is sharp, located in the right flank radiating down to his right groin.  Reports that the pain is similar to prior kidney stones that he had in the past.  Has had some dysuria earlier today.  Patient had nausea and vomiting prior to arrival.  Received Zofran per EMS.  After receiving Percocet in triage patient reports that his pain now is mild.  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    Right ankle pain 06/26/2020   Family history of breast cancer    Hx of colonic polyps    Hospital discharge follow-up 06/05/2020   Kidney function test abnormal 06/05/2020   Abdominal pain 04/12/2020   Hypertensive urgency 04/12/2020   Alcohol withdrawal ([place]) 03/04/2020   Right-sided chest wall pain 02/28/2020   Alcohol abuse with alcohol-induced mood disorder ([place]) 12/02/2019   Moderate episode of recurrent major depressive disorder ([place])    Health care maintenance 10/26/2019   Right  knee pain 10/26/2019   Generalized anxiety disorder 10/14/2019   MDD (major depressive disorder), recurrent severe, without psychosis ([place]) 07/27/2019   Social anxiety disorder 07/27/2019   Left-sided chest wall pain 29/19/1660   Alcoholic cirrhosis of liver without ascites ([place]) 06/14/2019   Alcohol abuse 06/14/2019   AKI (acute kidney injury) ([place]) 05/27/2019   Alcohol withdrawal syndrome without complication ([place]) 60/02/5996   Alcoholic intoxication without complication ([place])    Sepsis ([place]) 03/08/2019   Breast lump or mass 10/04/2018   Sepsis secondary to UTI ([place]) 09/14/2018   Asthma 07/07/2018   GERD (gastroesophageal reflux disease) 07/07/2018   OCD (obsessive compulsive disorder) 74/14/2395   Self-inflicted laceration of left wrist ([place]) 03/10/2018   Leg hematoma 12/25/2017   Suicide and self-inflicted injury by cutting and piercing instrument ([place]) 03/10/2017   Severe recurrent major 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 ([place]) 03/09/2017   Substance induced mood disorder ([place]) 08/15/2016   Involuntary commitment 08/15/2016   Alcohol use disorder, severe, dependence ([place]) 02/05/2016   Hypertension 12/05/2015   Tachycardia 12/05/2015   Gout 11/13/2015   Chronic back pain 05/02/2015    Past Surgical History:  Procedure Laterality Date   CHOLECYSTECTOMY  2012   COLONOSCOPY     COLONOSCOPY WITH PROPOFOL N/A 05/28/2020   Procedure: COLONOSCOPY WITH PROPOFOL;  Surgeon: [name], MD;  Location: [facility] ENDOSCOPY;  Service: Gastroenterology;  Laterality: N/A;  EXTRACORPOREAL SHOCK WAVE LITHOTRIPSY Left 01/12/2019   Procedure: EXTRACORPOREAL SHOCK WAVE LITHOTRIPSY (ESWL);  Surgeon: Billey Co, MD;  Location: [facility] ORS;  Service: Urology;  Laterality: Left;   UPPER GI ENDOSCOPY      Prior to Admission medications   Medication Sig Start Date End Date Taking? Authorizing Provider  cephALEXin (KEFLEX) 500 MG capsule Take 1 capsule (500 mg total) by mouth 3 (three) times daily  for 7 days. 08/09/21 08/16/21 Yes Lark Runk, [place], MD  ibuprofen (ADVIL) 800 MG tablet Take 1 tablet (800 mg total) by mouth every 8 (eight) hours as needed. 08/09/21  Yes Alfred Levins, [place], MD  ondansetron (ZOFRAN ODT) 4 MG disintegrating tablet Take 1 tablet (4 mg total) by mouth every 8 (eight) hours as needed. 08/09/21  Yes Alfred Levins, [place], MD  ADVAIR DISKUS 100-50 MCG/ACT AEPB INHALE 1 PUFF 2 TIMES A DAY. RINSE MOUTH AND SPIT AFTER EACH USE. 06/12/20 06/19/21  Tawni Millers, MD  allopurinol (ZYLOPRIM) 300 MG tablet TAKE ONE TABLET BY MOUTH EVERY DAY 04/15/21 11/01/21  Iloabachie, Chioma E, NP  ascorbic acid (VITAMIN C) 500 MG tablet Take by mouth. 08/01/19   [provider]  doxycycline (VIBRAMYCIN) 100 MG capsule Take 1 capsule (100 mg total) by mouth 2 (two) times daily for 7 days. 08/01/21 08/11/21  Duffy Bruce, MD  fluticasone (FLONASE) 50 MCG/ACT nasal spray SPRAY ONE SPRAY INTO BOTH NOSTRILS ONCE DAILY. 05/16/21 05/16/22  Lannie Fields, PA-C  folic acid (FOLVITE) 1 MG tablet TAKE ONE TABLET BY MOUTH EVERY DAY 05/21/21 05/21/22  Iloabachie, Chioma E, NP  HYDROcodone-acetaminophen (NORCO/VICODIN) 5-325 MG tablet Take 1 tablet by mouth every 6 (six) hours as needed. 07/02/21   Ward, Delice Bison, DO  hydrOXYzine (ATARAX/VISTARIL) 10 MG tablet Take 1 tablet (10 mg total) by mouth at bedtime as needed (sleep). 06/23/21   Merlyn Lot, MD  hydrOXYzine (VISTARIL) 100 MG capsule Take 1 capsule (100 mg total) by mouth 3 (three) times daily as needed (congestion). 07/28/21   Cuthriell, Charline Bills, PA-C  lisinopril (ZESTRIL) 40 MG tablet TAKE ONE TABLET BY MOUTH EVERY DAY 04/15/21 04/15/22  Iloabachie, Chioma E, NP  loratadine (CLARITIN) 10 MG tablet Take 10 mg by mouth daily.    [provider]  metoprolol tartrate (LOPRESSOR) 25 MG tablet Take 1 tablet (25 mg total) by mouth once daily. 05/21/21 06/25/21  Iloabachie, Chioma E, NP  mirtazapine (REMERON) 15 MG tablet Take 1 tablet (15 mg total)  by mouth at bedtime. 05/21/21 06/25/21  Iloabachie, Chioma E, NP  pantoprazole (PROTONIX) 40 MG tablet Take 1 tablet (40 mg total) by mouth once daily. 06/28/21 06/28/22  [name], MD  pseudoephedrine (SUDAFED) 120 MG 12 hr tablet Take 120 mg by mouth daily.    [provider]  thiamine (VITAMIN B-1) 100 MG tablet TAKE ONE TABLET BY MOUTH EVERY DAY 10/22/20 10/22/21  Iloabachie, Chioma E, NP  albuterol (VENTOLIN HFA) 108 (90 Base) MCG/ACT inhaler Inhale 2 puffs into the lungs every 4 (four) hours as needed. 05/27/21 05/27/22  Iloabachie, Chioma E, NP    Allergies Patient has no known allergies.  Family History  Problem Relation Age of Onset   Alcohol abuse Father    Breast cancer Mother 57   Anxiety disorder Brother    Other Maternal Grandmother        unknown medical history   Other Maternal Grandfather        unknow medical history   Other Paternal Grandmother  unknown medical history   Other Paternal Grandfather        unknown medical history   Healthy Brother     Social History Social History   Tobacco Use   Smoking status: Former    Types: Cigarettes    Quit date: 03/19/1981    Years since quitting: 40.4   Smokeless tobacco: Never  Vaping Use   Vaping Use: Never used  Substance Use Topics   Alcohol use: Yes    Alcohol/week: 24.0 standard drinks    Types: 24 Shots of liquor per week    Comment: LAst drink Friday 06/20/21   Drug use: Yes    Types: Marijuana    Review of Systems  Constitutional: Negative for fever. Eyes: Negative for visual changes. ENT: Negative for sore throat. Neck: No neck pain  Cardiovascular: Negative for chest pain. Respiratory: Negative for shortness of breath. Gastrointestinal: Negative for abdominal pain or diarrhea. + N/V Genitourinary: + dysuria and R flank pain Musculoskeletal: Negative for back pain. Skin: Negative for rash. Neurological: Negative for headaches, weakness or numbness. Psych: No SI or  HI  ____________________________________________   PHYSICAL EXAM:  VITAL SIGNS: ED Triage Vitals  Enc Vitals Group     BP 08/08/21 1743 (!) 141/93     Pulse Rate 08/08/21 1743 90     Resp 08/08/21 1743 19     Temp 08/08/21 1743 98 F (36.7 C)     Temp Source 08/08/21 1743 Oral     SpO2 08/08/21 1743 100 %     Weight --      Height --      Head Circumference --      Peak Flow --      Pain Score 08/08/21 1744 9     Pain Loc --      Pain Edu? --      Excl. in [place]? --     Constitutional: Alert and oriented. Well appearing and in no apparent distress. HEENT:      Head: Normocephalic and atraumatic.         Eyes: Conjunctivae are normal. Sclera is non-icteric.       Mouth/Throat: Mucous membranes are moist.       Neck: Supple with no signs of meningismus. Cardiovascular: Regular rate and rhythm. No murmurs, gallops, or rubs. 2+ symmetrical distal pulses are present in all extremities. No JVD. Respiratory: Normal respiratory effort. Lungs are clear to auscultation bilaterally.  Gastrointestinal: Soft, non tender, and non distended with positive bowel sounds. No rebound or guarding. Genitourinary: No CVA tenderness. Musculoskeletal:  No edema, cyanosis, or erythema of extremities. Neurologic: Normal speech and language. Face is symmetric. Moving all extremities. No gross focal neurologic deficits are appreciated. Skin: Skin is warm, dry and intact. No rash noted. Psychiatric: Mood and affect are normal. Speech and behavior are normal.  ____________________________________________   LABS (all labs ordered are listed, but only abnormal results are displayed)  Labs Reviewed  CBC WITH DIFFERENTIAL/PLATELET - Abnormal; Notable for the following components:      Result Value   HCT 38.7 (*)    RDW 17.4 (*)    All other components within normal limits  COMPREHENSIVE METABOLIC PANEL - Abnormal; Notable for the following components:   Potassium 3.3 (*)    Glucose, Bld 118 (*)     Creatinine, Ser 1.82 (*)    AST 14 (*)    GFR, Estimated 42 (*)    All other components within normal limits  URINALYSIS, ROUTINE  W REFLEX MICROSCOPIC - Abnormal; Notable for the following components:   Color, Urine AMBER (*)    APPearance CLOUDY (*)    Bilirubin Urine SMALL (*)    Ketones, ur 5 (*)    Protein, ur 100 (*)    Leukocytes,Ua MODERATE (*)    WBC, UA >50 (*)    Bacteria, UA RARE (*)    All other components within normal limits  URINE CULTURE   ____________________________________________  EKG  none  ____________________________________________  RADIOLOGY  I have personally reviewed the images performed during this visit and I agree with the Radiologist's read.   Interpretation by Radiologist:  CT Renal Stone Study  Result Date: 08/08/2021 CLINICAL DATA:  Flank pain, kidney stone suspected flank pain EXAM: CT ABDOMEN AND PELVIS WITHOUT CONTRAST TECHNIQUE: Multidetector CT imaging of the abdomen and pelvis was performed following the standard protocol without IV contrast. COMPARISON:  July 07, 2021 FINDINGS: Evaluation is limited by lack of IV contrast. Lower chest: No acute abnormality. Hepatobiliary: Status post cholecystectomy. Unremarkable noncontrast appearance of the liver. Pancreas: No peripancreatic fat stranding. Spleen: Unremarkable. Adrenals/Urinary Tract: Adrenal glands are unremarkable. There are multiple bilateral nonobstructive nephrolithiasis. Largest is in the superior pole of the LEFT kidney and measures 5 mm. Largest in the inferior pole of the RIGHT kidney measures 4 mm. Bladder is completely decompressed Stomach/Bowel: No evidence of bowel obstruction. Appendix is normal. Diverticulosis without evidence of acute diverticulitis. Duodenal diverticulum. Vascular/Lymphatic: Atherosclerotic calcifications. No new suspicious lymphadenopathy. Reproductive: Prostatomegaly. Other: No abdominal wall hernia or abnormality. No abdominopelvic ascites.  Musculoskeletal: No acute or significant osseous findings. IMPRESSION: No CT etiology for acute abdominal pain identified. There are multiple bilateral nonobstructive nephrolithiasis. Aortic Atherosclerosis (ICD10-I70.0). Electronically Signed   By: Valentino Saxon M.D.   On: 08/08/2021 19:07     ____________________________________________   PROCEDURES  Procedure(s) performed: None Procedures   Critical Care performed:  None ____________________________________________   INITIAL IMPRESSION / ASSESSMENT AND PLAN / ED COURSE  60 y.o. male history of alcohol abuse, anxiety, asthma, GERD, hypertension, kidney stones who presents for evaluation of right flank pain, dysuria, nausea, and vomiting x 1 day.  Patient's symptoms are better controlled after receiving Zofran per EMS and Percocet in triage.  Labs showing UA with moderate leuks, greater than 50 WBCs and some bacteria but no nitrites.  Normal white count with no signs of sepsis.  Labs showing mild bump in the creatinine from 1.3-1.8.  CT renal showing several nephrolithiasis but no ureterolithiasis.  Presentation is concerning for either UTI/early pyelonephritis versus a passed kidney stone.  Patient given Toradol, Percocet in the ED.  Given Zofran per EMS.  I did give him a liter bolus for the mildly bumped creatinine.  Patient given IV Rocephin for possible early Pyelo/UTI.  Patient reports no pain at this time. Recommend increase oral hydration at home and close follow-up with primary care doctor.  Will discharge home on Keflex.  Discussed my standard return precautions       _____________________________________________ Please note:  Patient was evaluated in Emergency Department today for the symptoms described in the history of present illness. Patient was evaluated in the context of the global COVID-19 pandemic, which necessitated consideration that the patient might be at risk for infection with the SARS-CoV-2 virus that causes  COVID-19. Institutional protocols and algorithms that pertain to the evaluation of patients at risk for COVID-19 are in a state of rapid change based on information released by regulatory bodies including the CDC and  federal and state organizations. These policies and algorithms were followed during the patient's care in the ED.  Some ED evaluations and interventions may be delayed as a result of limited staffing during the pandemic.   [place] Controlled Substance Database was reviewed by me. ____________________________________________   FINAL CLINICAL IMPRESSION(S) / ED DIAGNOSES   Final diagnoses:  Flank pain  Renal colic  Acute cystitis with hematuria      NEW MEDICATIONS STARTED DURING THIS VISIT:  ED Discharge Orders          Ordered    ondansetron (ZOFRAN ODT) 4 MG disintegrating tablet  Every 8 hours PRN        08/09/21 0055    ibuprofen (ADVIL) 800 MG tablet  Every 8 hours PRN        08/09/21 0055    cephALEXin (KEFLEX) 500 MG capsule  3 times daily        08/09/21 0055             Note:  This document was prepared using Dragon voice recognition software and may include unintentional dictation errors.    Alfred Levins, [place], MD 08/09/21 [phone]

## 2021-08-10 LAB — URINE CULTURE: Culture: NO GROWTH

## 2021-08-10 IMAGING — DX DG SHOULDER 2+V*R*
3 series · 3 of 3 positions shown · non-contrast
Comparison: None

CLINICAL DATA: RIGHT shoulder pain post fall

EXAM:
RIGHT SHOULDER - 2+ VIEW

[shoulder ap]
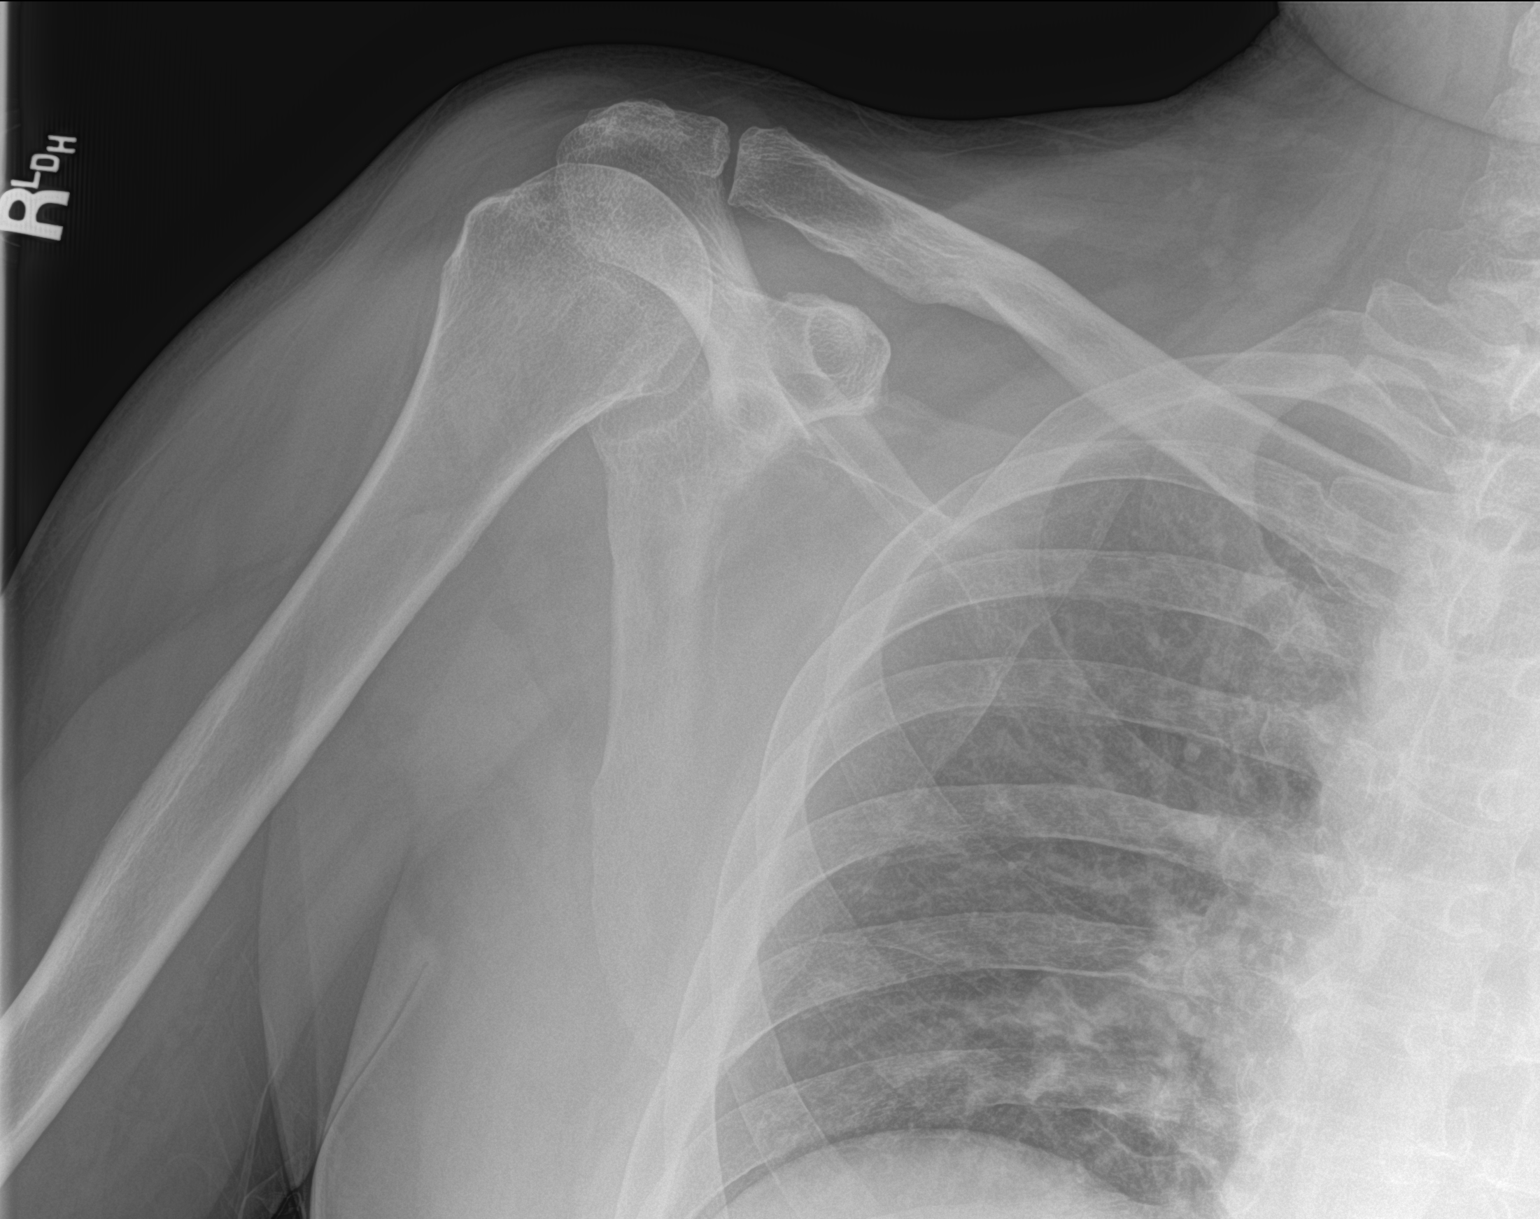

[shoulder obl (1 of 2)]
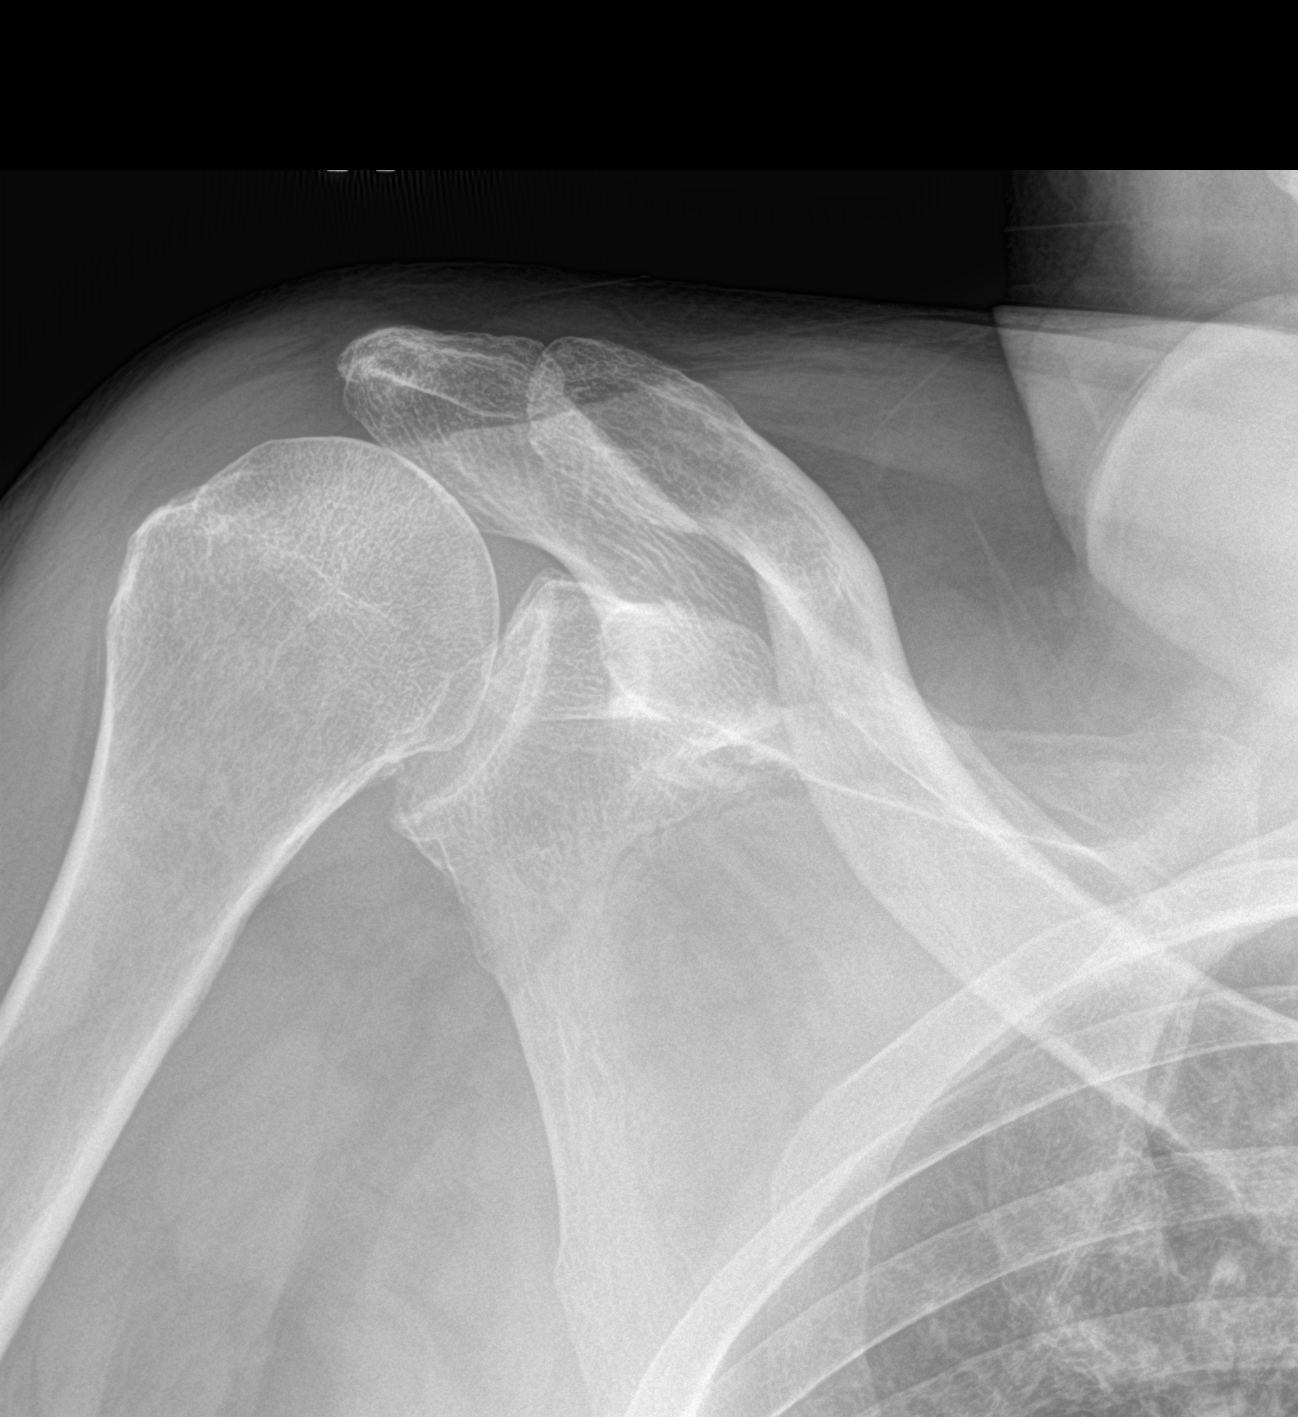

[shoulder obl (2 of 2)]
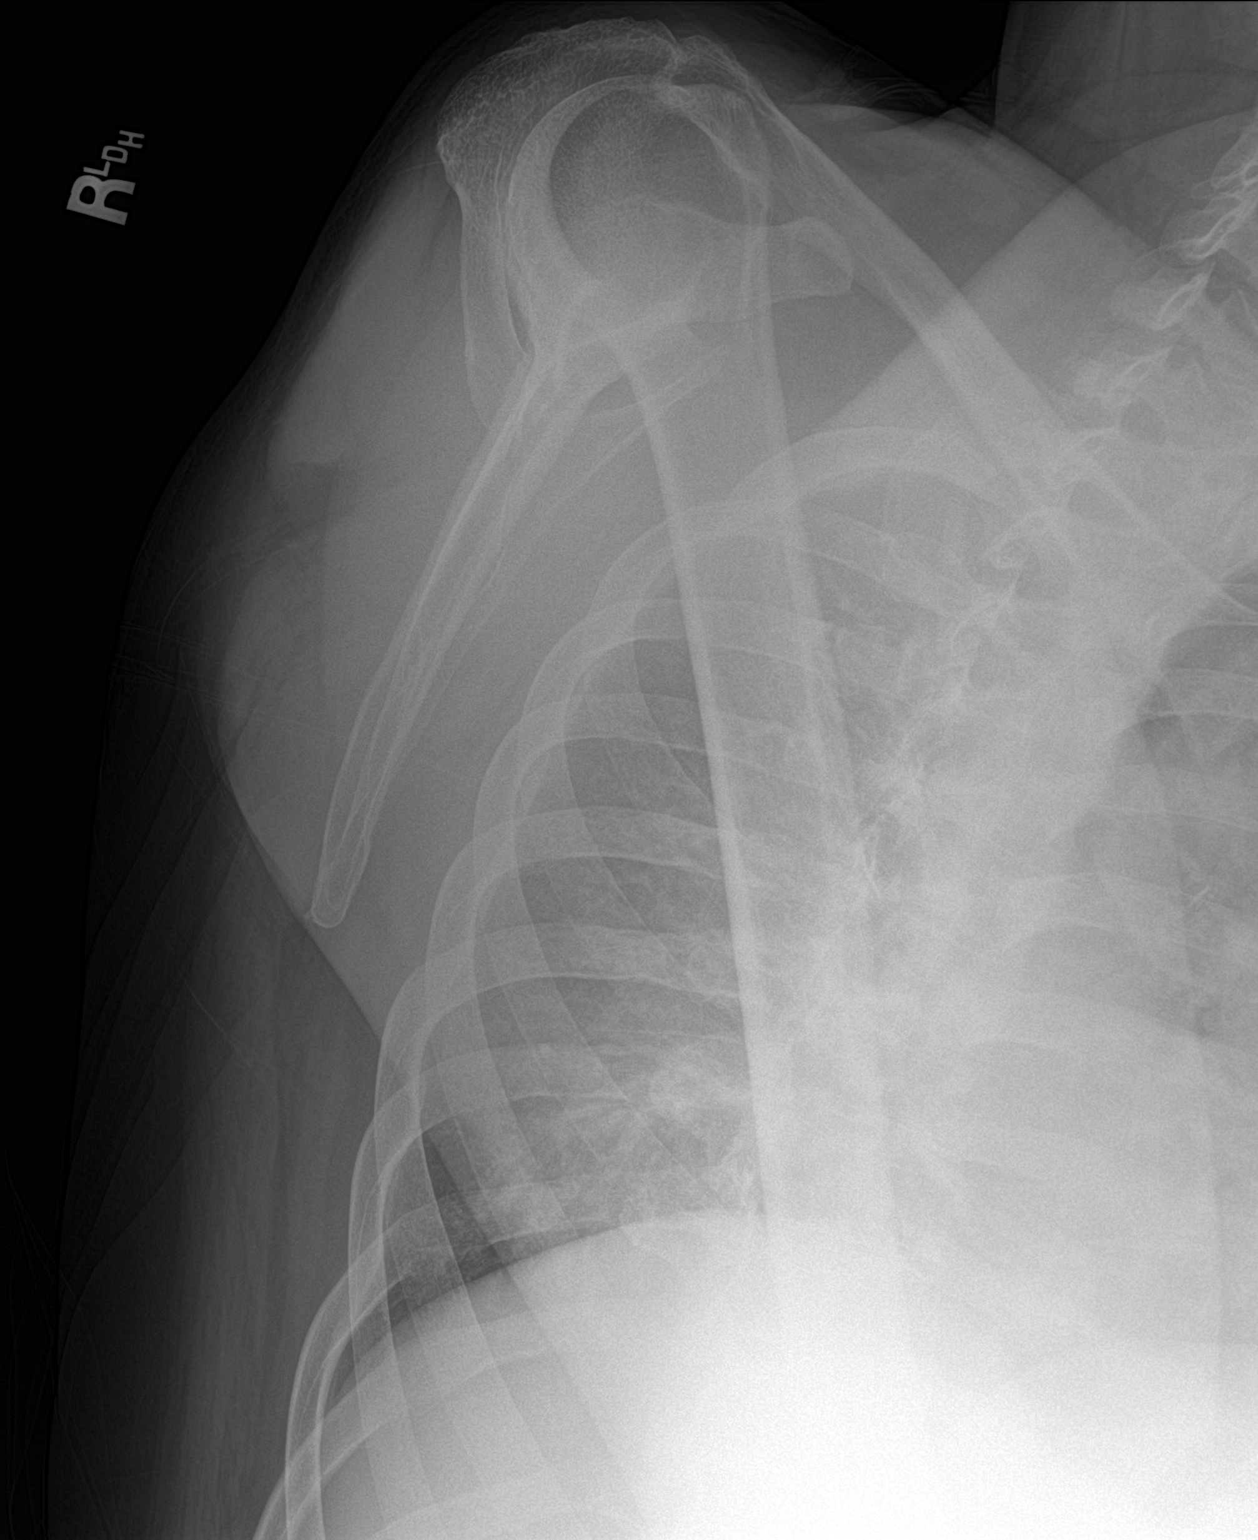

[3 of 3 positions shown; findings below may reference images not displayed]

FINDINGS: Osseous mineralization decreased.

AC joint alignment normal.

Visualized RIGHT ribs intact.

No acute fracture, dislocation, or bone destruction.
IMPRESSION: No acute abnormalities.

## 2021-08-10 IMAGING — DX DG CHEST 1V PORT
1 series · 1 of 1 positions shown · non-contrast
Comparison: Portable exam 9478 hours compared to 10/18/2020

CLINICAL DATA: BILATERAL shoulder pain post fall, asthma,
hypertension, former smoker

EXAM:
PORTABLE CHEST 1 VIEW

[chest ap]
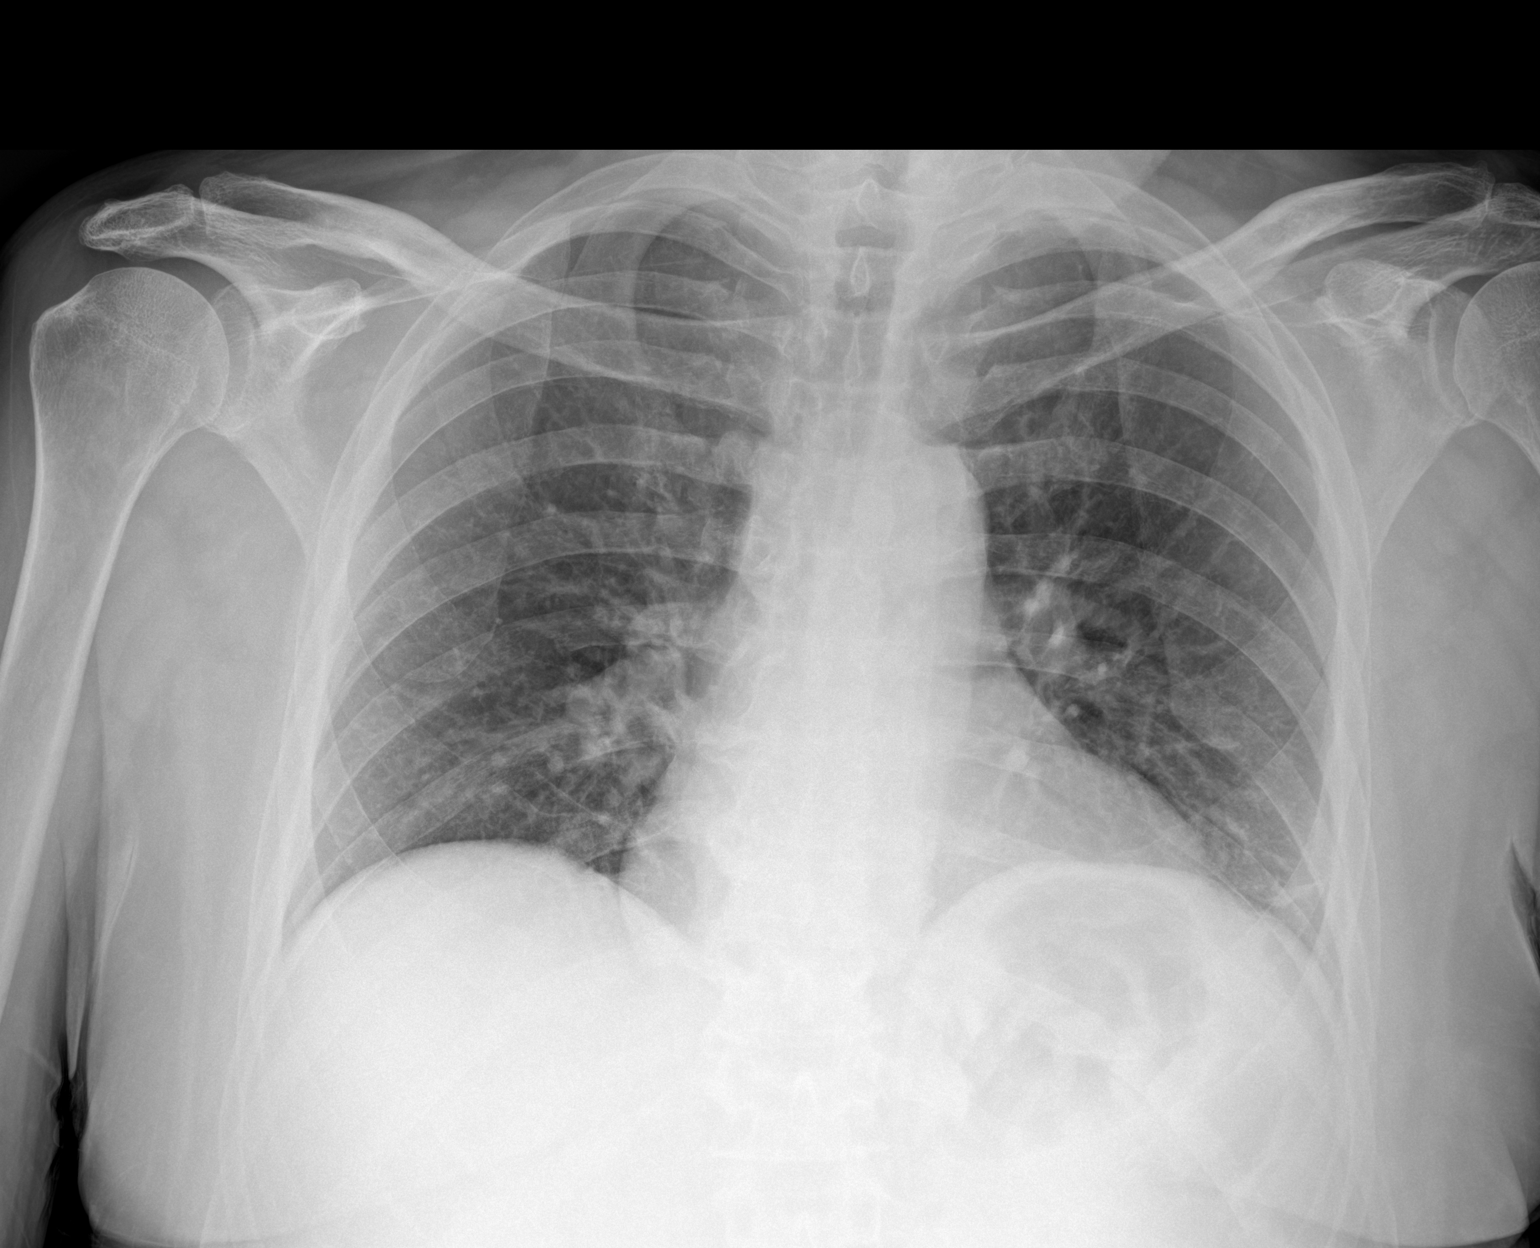

[1 of 1 positions shown; findings below may reference images not displayed]

FINDINGS: Normal heart size, mediastinal contours, and pulmonary vascularity.

Minimal bibasilar atelectasis.

Lungs otherwise clear.

No infiltrate, pleural effusion or pneumothorax.

No acute osseous findings.
IMPRESSION: Minimal bibasilar atelectasis.

## 2021-08-10 IMAGING — DX DG SHOULDER 2+V*L*
3 series · 3 of 3 positions shown · non-contrast
Comparison: None

CLINICAL DATA: Pain post fall

EXAM:
LEFT SHOULDER - 2+ VIEW

[shoulder axial]
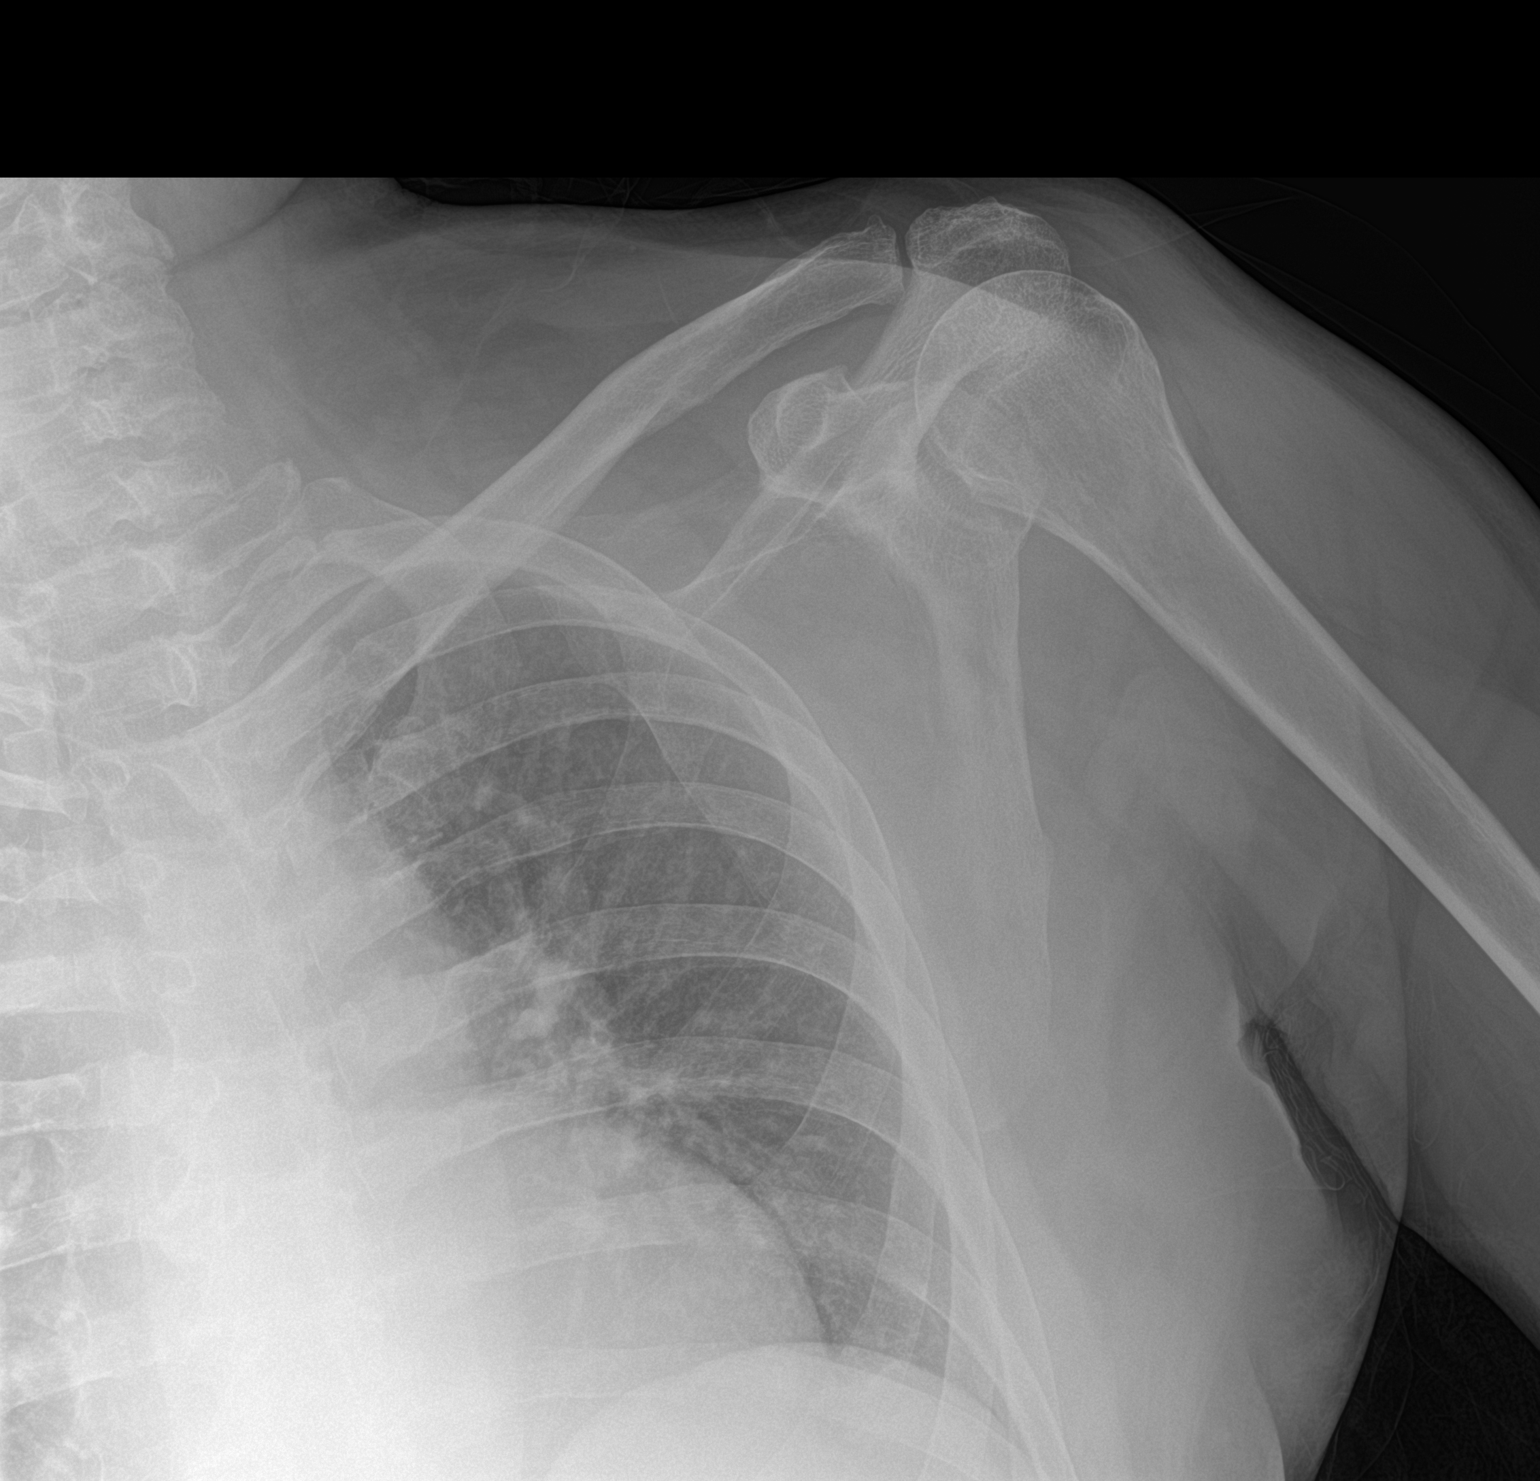

[shoulder obl (1 of 2)]
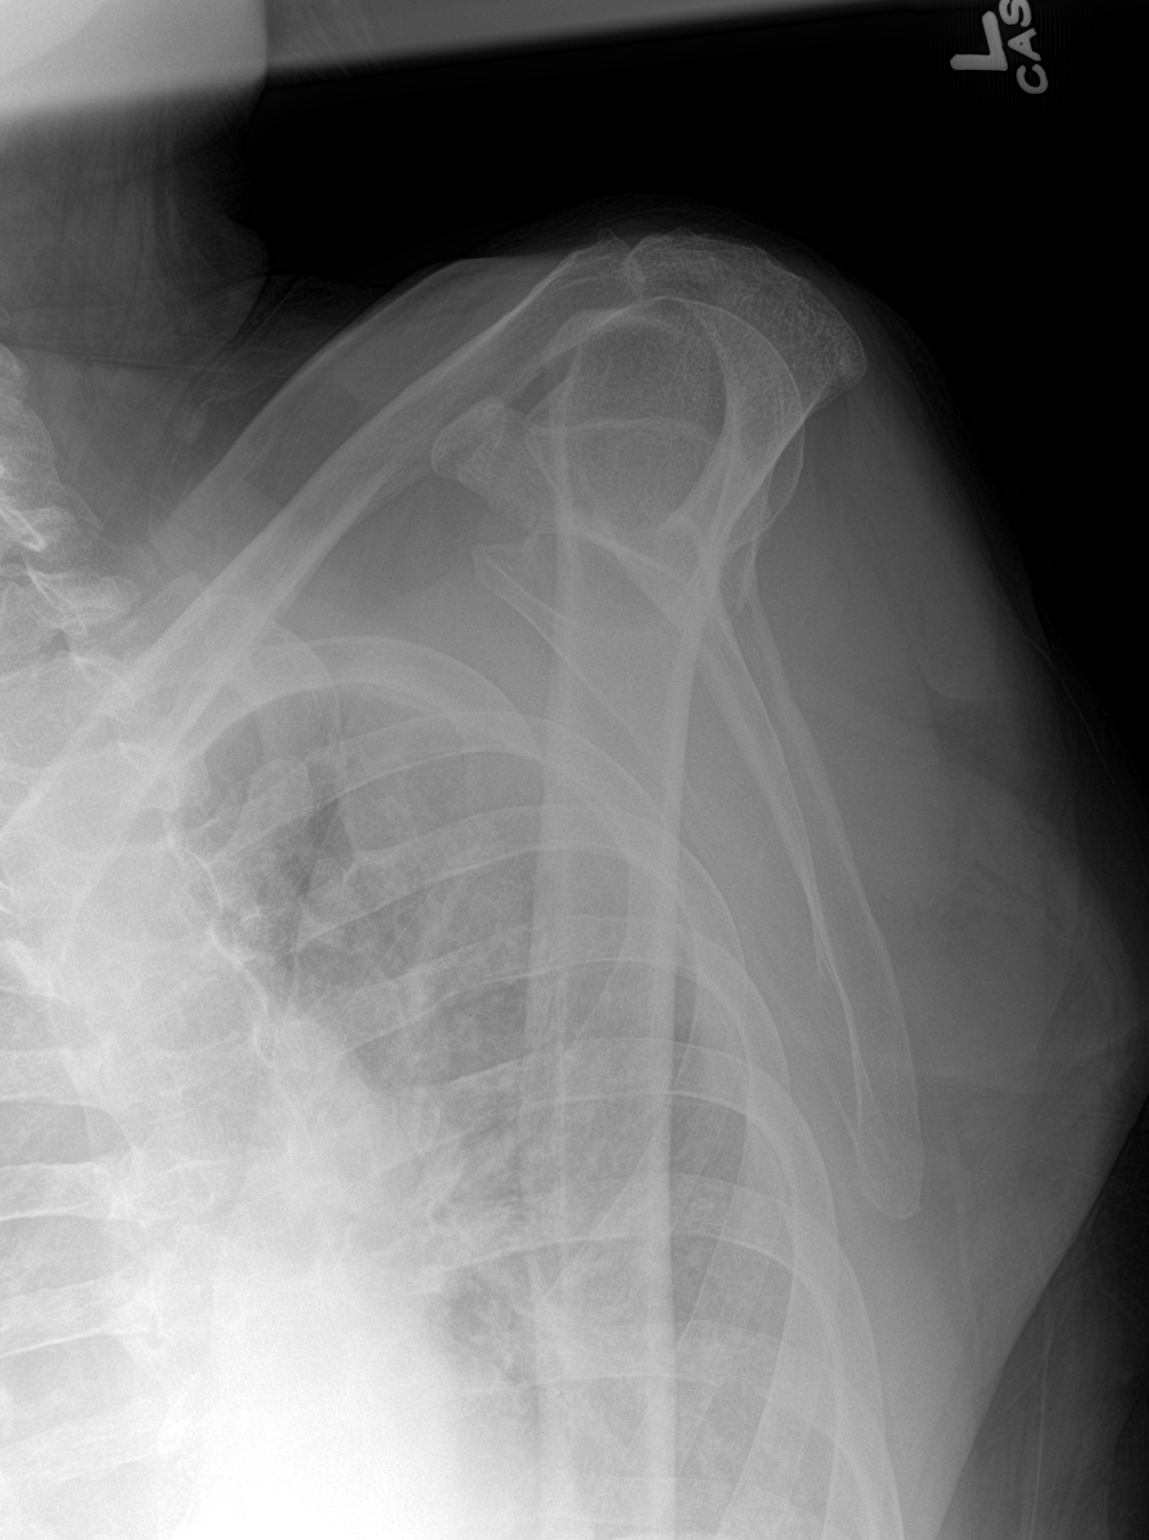

[shoulder obl (2 of 2)]
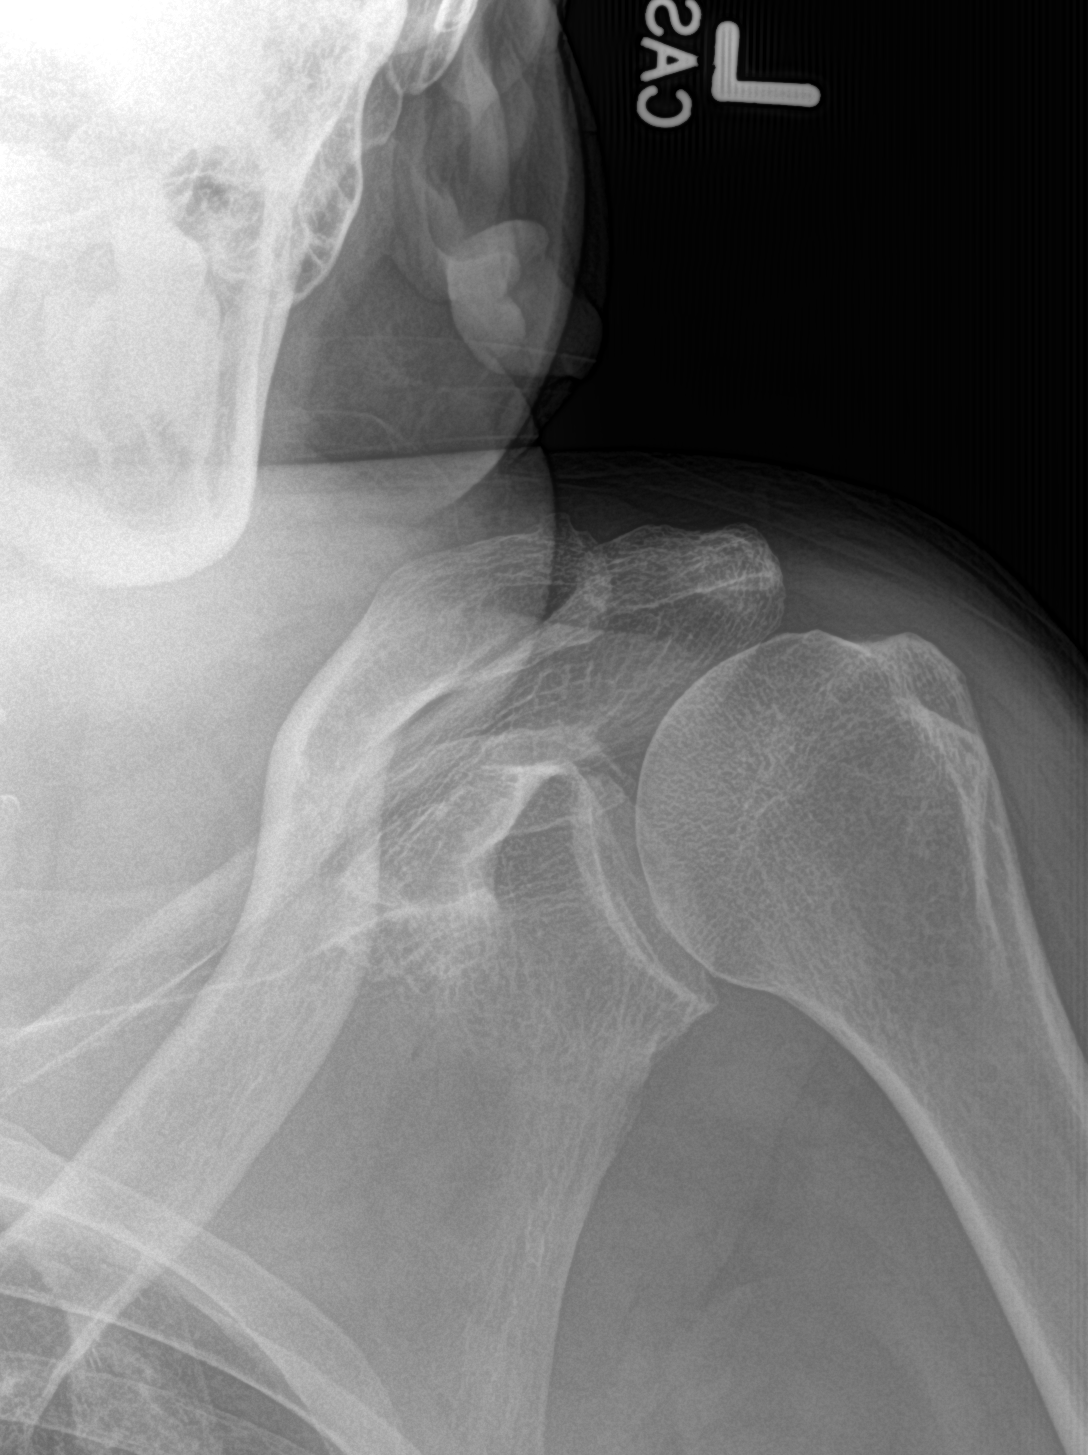

[3 of 3 positions shown; findings below may reference images not displayed]

FINDINGS: Osseous demineralization.

AC joint alignment normal.

Visualized LEFT ribs intact.

No fracture, dislocation, or bone destruction.
IMPRESSION: No acute abnormalities.

## 2021-08-11 ENCOUNTER — Other Ambulatory Visit: Payer: Self-pay

## 2021-08-12 ENCOUNTER — Other Ambulatory Visit: Payer: Self-pay

## 2021-08-13 ENCOUNTER — Other Ambulatory Visit: Payer: Self-pay

## 2021-08-13 ENCOUNTER — Telehealth: Payer: Self-pay | Admitting: Gerontology

## 2021-08-13 NOTE — Telephone Encounter (Signed)
-----   Message from Langston Reusing, NP sent at 08/07/2021  5:00 PM EDT ----- Pls schedule an in clinic appointment for Mr Miguel [name] in 2 weeks and make a telephone note. Thank you

## 2021-08-14 ENCOUNTER — Other Ambulatory Visit: Payer: Self-pay

## 2021-08-14 ENCOUNTER — Ambulatory Visit: Payer: Medicaid Other | Admitting: Gerontology

## 2021-08-17 ENCOUNTER — Encounter: Payer: Self-pay | Admitting: Emergency Medicine

## 2021-08-17 ENCOUNTER — Emergency Department
Admission: EM | Admit: 2021-08-17 | Discharge: 2021-08-17 | Disposition: A | Discharge status: Home / Self Care | Payer: Medicaid Other | Attending: Emergency Medicine | Admitting: Emergency Medicine

## 2021-08-17 ENCOUNTER — Other Ambulatory Visit: Payer: Self-pay

## 2021-08-17 DIAGNOSIS — Z79899 Other long term (current) drug therapy: Secondary | ICD-10-CM | POA: Insufficient documentation

## 2021-08-17 DIAGNOSIS — J45909 Unspecified asthma, uncomplicated: Secondary | ICD-10-CM | POA: Insufficient documentation

## 2021-08-17 DIAGNOSIS — I10 Essential (primary) hypertension: Secondary | ICD-10-CM | POA: Insufficient documentation

## 2021-08-17 DIAGNOSIS — J019 Acute sinusitis, unspecified: Secondary | ICD-10-CM | POA: Insufficient documentation

## 2021-08-17 DIAGNOSIS — R0981 Nasal congestion: Secondary | ICD-10-CM

## 2021-08-17 DIAGNOSIS — J329 Chronic sinusitis, unspecified: Secondary | ICD-10-CM

## 2021-08-17 DIAGNOSIS — Z87891 Personal history of nicotine dependence: Secondary | ICD-10-CM | POA: Insufficient documentation

## 2021-08-17 MED ORDER — AMOXICILLIN-POT CLAVULANATE 875-125 MG PO TABS
1.0000 | ORAL_TABLET | Freq: Two times a day (BID) | ORAL | 0 refills | Status: DC
  Filled 2021-08-17: qty 14, 7d supply, fill #0

## 2021-08-17 MED ORDER — AMOXICILLIN-POT CLAVULANATE 875-125 MG PO TABS: 1.0000 | ORAL_TABLET | Freq: Two times a day (BID) | ORAL | 0 refills | Status: AC

## 2021-08-17 MED ORDER — HYDROXYZINE HCL 25 MG PO TABS
25.0000 mg | ORAL_TABLET | Freq: Once | ORAL | Status: AC
  Administered 2021-08-17: 25 mg via ORAL
  Filled 2021-08-17: qty 1

## 2021-08-17 MED ORDER — OXYMETAZOLINE HCL 0.05 % NA SOLN: 2.0000 | Freq: Two times a day (BID) | NASAL | 2 refills | Status: DC

## 2021-08-17 MED ORDER — OXYMETAZOLINE HCL 0.05 % NA SOLN
2.0000 | Freq: Two times a day (BID) | NASAL | 2 refills | Status: DC
  Filled 2021-08-17: qty 15, 38d supply, fill #0

## 2021-08-17 NOTE — ED Triage Notes (Signed)
Pt to ED via [facility] with c/o anxiety that has been ongoing all day, per EMS pt also c/o nasal congestion, per EMS pt has been using albuterol inhaler and OTC nasal decongestants without relief. Per EMS pt denies ETOH use since yesterday.   Per EMS VSS en route.

## 2021-08-17 NOTE — ED Triage Notes (Addendum)
Pt via EMS from home. Pt c/o anxiety since midnight last night. Pt states last alcohol consumption was last night. Pt had a couple of Bloody Marys. Denies SI/HI. Pt states that he has no PRN anxiety medications he could take. Pt also c/o nasal congestion, states its been off and on for a while. Pt has tried OTC medication with no relief. Denies cough. Denies CP. Denies SOB.   Pt is A&Ox4 and NAD.

## 2021-08-17 NOTE — Discharge Instructions (Addendum)
Please seek medical attention for any high fevers, chest pain, shortness of breath, change in behavior, persistent vomiting, bloody stool or any other new or concerning symptoms.  

## 2021-08-17 NOTE — ED Provider Notes (Signed)
[facility] Emergency Department Provider Note   ____________________________________________   I have reviewed the triage vital signs and the nursing notes.   HISTORY  Chief Complaint Nasal   History limited by: Not Limited   HPI Miguel [name] is a 60 y.o. male who presents to the emergency department today because of concern for nasal congestion. He states that his nose has been congested for three weeks. He has tried some over the counter medications without any relief. He states that his congested nose is making it hard for him to sleep at night. Because of this his anxiety has also increased. The patient denies any fevers.    Records reviewed. Per medical record review patient has a history of frequent ER visits. Has had ER visit for nasal congestion in the past.   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    Right ankle pain 06/26/2020   Family history of breast cancer    Hx of colonic polyps    Hospital discharge follow-up 06/05/2020   Kidney function test abnormal 06/05/2020   Abdominal pain 04/12/2020   Hypertensive urgency 04/12/2020   Alcohol withdrawal ([place]) 03/04/2020   Right-sided chest wall pain 02/28/2020   Alcohol abuse with alcohol-induced mood disorder ([place]) 12/02/2019   Moderate episode of recurrent major depressive disorder ([place])    Health care maintenance 10/26/2019   Right knee pain 10/26/2019   Generalized anxiety disorder 10/14/2019   MDD (major depressive  disorder), recurrent severe, without psychosis ([place]) 07/27/2019   Social anxiety disorder 07/27/2019   Left-sided chest wall pain 42/59/5638   Alcoholic cirrhosis of liver without ascites ([place]) 06/14/2019   Alcohol abuse 06/14/2019   AKI (acute kidney injury) ([place]) 05/27/2019   Alcohol withdrawal syndrome without complication ([place]) 75/64/3329   Alcoholic intoxication without complication ([place])    Sepsis ([place]) 03/08/2019   Breast lump or mass 10/04/2018   Sepsis secondary to UTI ([place]) 09/14/2018   Asthma 07/07/2018   GERD (gastroesophageal reflux disease) 07/07/2018   OCD (obsessive compulsive disorder) 51/88/4166   Self-inflicted laceration of left wrist ([place]) 03/10/2018   Leg hematoma 12/25/2017   Suicide and self-inflicted injury by cutting and piercing instrument ([place]) 03/10/2017   Severe recurrent major 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 ([place]) 03/09/2017   Substance induced mood disorder ([place]) 08/15/2016   Involuntary commitment 08/15/2016   Alcohol use disorder, severe, dependence ([place]) 02/05/2016   Hypertension 12/05/2015   Tachycardia 12/05/2015   Gout 11/13/2015   Chronic back pain 05/02/2015    Past Surgical History:  Procedure Laterality Date   CHOLECYSTECTOMY  2012   COLONOSCOPY     COLONOSCOPY WITH PROPOFOL N/A 05/28/2020   Procedure: COLONOSCOPY WITH PROPOFOL;  Surgeon: [name], MD;  Location: [facility] ENDOSCOPY;  Service: Gastroenterology;  Laterality: N/A;   EXTRACORPOREAL SHOCK WAVE LITHOTRIPSY Left 01/12/2019   Procedure: EXTRACORPOREAL SHOCK WAVE LITHOTRIPSY (ESWL);  Surgeon:  Billey Co, MD;  Location: [facility] ORS;  Service: Urology;  Laterality: Left;   UPPER GI ENDOSCOPY      Prior to Admission medications   Medication Sig Start Date End Date Taking? Authorizing Provider  amoxicillin-clavulanate (AUGMENTIN) 875-125 MG tablet Take 1 tablet by mouth 2 (two) times daily for 7 days. 08/17/21 08/24/21 Yes Nance Pear, MD  oxymetazoline (AFRIN) 0.05 %  nasal spray Place 2 sprays into both nostrils 2 (two) times daily. 08/17/21 08/17/22 Yes Nance Pear, MD  ADVAIR DISKUS 100-50 MCG/ACT AEPB INHALE 1 PUFF 2 TIMES A DAY. RINSE MOUTH AND SPIT AFTER EACH USE. 06/12/20 06/19/21  Tawni Millers, MD  allopurinol (ZYLOPRIM) 300 MG tablet TAKE ONE TABLET BY MOUTH EVERY DAY 04/15/21 11/01/21  Iloabachie, Chioma E, NP  ascorbic acid (VITAMIN C) 500 MG tablet Take by mouth. 08/01/19   [provider]  fluticasone (FLONASE) 50 MCG/ACT nasal spray SPRAY ONE SPRAY INTO BOTH NOSTRILS ONCE DAILY. 05/16/21 05/16/22  Lannie Fields, PA-C  folic acid (FOLVITE) 1 MG tablet TAKE ONE TABLET BY MOUTH EVERY DAY 05/21/21 05/21/22  Iloabachie, Chioma E, NP  HYDROcodone-acetaminophen (NORCO/VICODIN) 5-325 MG tablet Take 1 tablet by mouth every 6 (six) hours as needed. 07/02/21   Ward, Delice Bison, DO  hydrOXYzine (ATARAX/VISTARIL) 10 MG tablet Take 1 tablet (10 mg total) by mouth at bedtime as needed (sleep). 06/23/21   Merlyn Lot, MD  hydrOXYzine (VISTARIL) 100 MG capsule Take 1 capsule (100 mg total) by mouth 3 (three) times daily as needed (congestion). 07/28/21   Cuthriell, Charline Bills, PA-C  ibuprofen (ADVIL) 800 MG tablet Take 1 tablet (800 mg total) by mouth every 8 (eight) hours as needed. 08/09/21   Alfred Levins, [place], MD  lisinopril (ZESTRIL) 40 MG tablet TAKE ONE TABLET BY MOUTH EVERY DAY 04/15/21 04/15/22  Iloabachie, Chioma E, NP  loratadine (CLARITIN) 10 MG tablet Take 10 mg by mouth daily.    [provider]  metoprolol tartrate (LOPRESSOR) 25 MG tablet Take 1 tablet (25 mg total) by mouth once daily. 05/21/21 06/25/21  Iloabachie, Chioma E, NP  mirtazapine (REMERON) 15 MG tablet Take 1 tablet (15 mg total) by mouth at bedtime. 05/21/21 06/25/21  Iloabachie, Chioma E, NP  ondansetron (ZOFRAN ODT) 4 MG disintegrating tablet Take 1 tablet (4 mg total) by mouth every 8 (eight) hours as needed. 08/09/21   Rudene Re, MD  pantoprazole (PROTONIX) 40 MG tablet  Take 1 tablet (40 mg total) by mouth once daily. 06/28/21 06/28/22  [name], MD  pseudoephedrine (SUDAFED) 120 MG 12 hr tablet Take 120 mg by mouth daily.    [provider]  thiamine (VITAMIN B-1) 100 MG tablet TAKE ONE TABLET BY MOUTH EVERY DAY 10/22/20 10/22/21  Iloabachie, Chioma E, NP  albuterol (VENTOLIN HFA) 108 (90 Base) MCG/ACT inhaler Inhale 2 puffs into the lungs every 4 (four) hours as needed. 05/27/21 05/27/22  Iloabachie, Chioma E, NP    Allergies Patient has no known allergies.  Family History  Problem Relation Age of Onset   Alcohol abuse Father    Breast cancer Mother 37   Anxiety disorder Brother    Other Maternal Grandmother        unknown medical history   Other Maternal Grandfather        unknow medical history   Other Paternal Grandmother        unknown medical history   Other Paternal Grandfather        unknown medical history  Healthy Brother     Social History Social History   Tobacco Use   Smoking status: Former    Types: Cigarettes    Quit date: 03/19/1981    Years since quitting: 40.4   Smokeless tobacco: Never  Vaping Use   Vaping Use: Never used  Substance Use Topics   Alcohol use: Yes    Alcohol/week: 24.0 standard drinks    Types: 24 Shots of liquor per week    Comment: LAst drink Friday 06/20/21   Drug use: Yes    Types: Marijuana    Review of Systems Constitutional: No fever/chills Eyes: No visual changes. ENT: Positive for nasal congestion.  Cardiovascular: Denies chest pain. Respiratory: Denies shortness of breath. Gastrointestinal: No abdominal pain.  No nausea, no vomiting.  No diarrhea.   Genitourinary: Negative for dysuria. Musculoskeletal: Negative for back pain. Skin: Negative for rash. Neurological: Negative for headaches, focal weakness or numbness.  ____________________________________________   PHYSICAL EXAM:  VITAL SIGNS: ED Triage Vitals [08/17/21 1926]  Enc Vitals Group     BP (!) 159/97      Pulse Rate (!) 101     Resp 18     Temp 98.2 F (36.8 C)     Temp Source Oral     SpO2 99 %     Weight 185 lb (83.9 kg)     Height 6' (1.829 m)     Head Circumference      Peak Flow      Pain Score 0   Constitutional: Alert and oriented.  Eyes: Conjunctivae are normal.  ENT      Head: Normocephalic and atraumatic.      Nose: No congestion/rhinnorhea.      Mouth/Throat: Mucous membranes are moist.      Neck: No stridor. Hematological/Lymphatic/Immunilogical: No cervical lymphadenopathy. Cardiovascular: Normal rate, regular rhythm.  No murmurs, rubs, or gallops.  Respiratory: Normal respiratory effort without tachypnea nor retractions. Breath sounds are clear and equal bilaterally. No wheezes/rales/rhonchi. Gastrointestinal: Soft and non tender. No rebound. No guarding.  Genitourinary: Deferred Musculoskeletal: Normal range of motion in all extremities. No lower extremity edema. Neurologic:  Normal speech and language. No gross focal neurologic deficits are appreciated.  Skin:  Skin is warm, dry and intact. No rash noted. Psychiatric: Slightly anxious.  ____________________________________________    LABS (pertinent positives/negatives)  None  ____________________________________________   EKG  None  ____________________________________________    RADIOLOGY  None  ____________________________________________   PROCEDURES  Procedures  ____________________________________________   INITIAL IMPRESSION / ASSESSMENT AND PLAN / ED COURSE  Pertinent labs & imaging results that were available during my care of the patient were reviewed by me and considered in my medical decision making (see chart for details).   Patient presented to the emergency department today because of concerns for nasal congestion.  Given that its been going on for roughly 3 weeks they have concerns for some sinusitis.  Discussed this with the patient.  Will start on antibiotics and will  give patient prescription for Afrin.  ____________________________________________   FINAL CLINICAL IMPRESSION(S) / ED DIAGNOSES  Final diagnoses:  Nasal congestion  Sinusitis, unspecified chronicity, unspecified location     Note: This dictation was prepared with Dragon dictation. Any transcriptional errors that result from this process are unintentional     Nance Pear, MD 08/17/21 2040

## 2021-08-17 NOTE — ED Notes (Signed)
Patient reports having nasal congestion for couple weeks and has tried using otc nasal spray with no relief.  Patient also reports feeling more anxious than usual since last night.  Patient with last alcohol use yesterday.  Patient with noted tremor.

## 2021-08-18 ENCOUNTER — Other Ambulatory Visit: Payer: Self-pay | Admitting: Gerontology

## 2021-08-18 ENCOUNTER — Other Ambulatory Visit: Payer: Self-pay

## 2021-08-19 ENCOUNTER — Other Ambulatory Visit: Payer: Self-pay

## 2021-08-19 MED ORDER — ALBUTEROL SULFATE HFA 108 (90 BASE) MCG/ACT IN AERS
2.0000 | INHALATION_SPRAY | RESPIRATORY_TRACT | 0 refills | Status: DC | PRN
  Filled 2021-08-19: qty 8.5, 17d supply, fill #0

## 2021-08-20 ENCOUNTER — Encounter: Payer: Self-pay | Admitting: Emergency Medicine

## 2021-08-20 ENCOUNTER — Other Ambulatory Visit: Payer: Self-pay

## 2021-08-20 ENCOUNTER — Emergency Department
Admission: EM | Admit: 2021-08-20 | Discharge: 2021-08-20 | Disposition: A | Discharge status: Home / Self Care | Payer: Medicaid Other | Attending: Emergency Medicine | Admitting: Emergency Medicine

## 2021-08-20 ENCOUNTER — Emergency Department: Payer: Medicaid Other

## 2021-08-20 DIAGNOSIS — M549 Dorsalgia, unspecified: Secondary | ICD-10-CM | POA: Insufficient documentation

## 2021-08-20 DIAGNOSIS — Z5321 Procedure and treatment not carried out due to patient leaving prior to being seen by health care provider: Secondary | ICD-10-CM | POA: Insufficient documentation

## 2021-08-20 DIAGNOSIS — W010XXA Fall on same level from slipping, tripping and stumbling without subsequent striking against object, initial encounter: Secondary | ICD-10-CM | POA: Insufficient documentation

## 2021-08-20 DIAGNOSIS — M542 Cervicalgia: Secondary | ICD-10-CM | POA: Insufficient documentation

## 2021-08-20 DIAGNOSIS — F10120 Alcohol abuse with intoxication, uncomplicated: Secondary | ICD-10-CM | POA: Insufficient documentation

## 2021-08-20 LAB — CBC
HCT: 41.2 % (ref 39.0–52.0)
Hemoglobin: 13.8 g/dL (ref 13.0–17.0)
MCH: 28.8 pg (ref 26.0–34.0)
MCHC: 33.5 g/dL (ref 30.0–36.0)
MCV: 85.8 fL (ref 80.0–100.0)
Platelets: 257 10*3/uL (ref 150–400)
RBC: 4.8 MIL/uL (ref 4.22–5.81)
RDW: 17.1 % — ABNORMAL HIGH (ref 11.5–15.5)
WBC: 8.1 10*3/uL (ref 4.0–10.5)
nRBC: 0 % (ref 0.0–0.2)

## 2021-08-20 LAB — COMPREHENSIVE METABOLIC PANEL
ALT: 15 U/L (ref 0–44)
AST: 16 U/L (ref 15–41)
Albumin: 4.3 g/dL (ref 3.5–5.0)
Alkaline Phosphatase: 58 U/L (ref 38–126)
Anion gap: 11 (ref 5–15)
BUN: 17 mg/dL (ref 8–23)
CO2: 24 mmol/L (ref 22–32)
Calcium: 8.7 mg/dL — ABNORMAL LOW (ref 8.9–10.3)
Chloride: 108 mmol/L (ref 98–111)
Creatinine, Ser: 1.2 mg/dL (ref 0.61–1.24)
GFR, Estimated: >60 mL/min (ref >60)
Glucose, Bld: 110 mg/dL — ABNORMAL HIGH (ref 70–99)
Potassium: 3.7 mmol/L (ref 3.5–5.1)
Sodium: 143 mmol/L (ref 135–145)
Total Bilirubin: 0.6 mg/dL (ref 0.3–1.2)
Total Protein: 7.2 g/dL (ref 6.5–8.1)

## 2021-08-20 NOTE — ED Triage Notes (Signed)
First Nurse Note:  Arrives via [facility].  Claims to have drank 3 half pints of vodka today. C/O fall this afternoon.  Patient was standing when first responders arrived.  Patient stated that "the squirrel helped him get up".  VS wnl.

## 2021-08-20 NOTE — ED Notes (Signed)
Red top sent to lab with save label.

## 2021-08-20 NOTE — ED Triage Notes (Signed)
Pt comes into the ED via [facility] from home c/o 3 shots of vodka and then c/o fall this afternoon.  VS WNL for EMS.  Pt c/o neck and back pain s/p fall.  Pt states he tripped over something and then fell.  Pt denies any LOC.  Pt in NAD at this time with even and unlabored respirations.

## 2021-08-21 ENCOUNTER — Other Ambulatory Visit: Payer: Self-pay

## 2021-08-28 ENCOUNTER — Other Ambulatory Visit: Payer: Self-pay

## 2021-08-28 ENCOUNTER — Ambulatory Visit: Payer: Medicaid Other | Admitting: Gerontology

## 2021-08-28 ENCOUNTER — Encounter: Payer: Self-pay | Admitting: Gerontology

## 2021-08-28 VITALS — BP 150/85 | HR 81 | Temp 97.7°F | Resp 16 | Wt 188.6 lb

## 2021-08-28 DIAGNOSIS — R0602 Shortness of breath: Secondary | ICD-10-CM | POA: Insufficient documentation

## 2021-08-28 DIAGNOSIS — F1010 Alcohol abuse, uncomplicated: Secondary | ICD-10-CM

## 2021-08-28 DIAGNOSIS — I10 Essential (primary) hypertension: Secondary | ICD-10-CM

## 2021-08-28 DIAGNOSIS — Z0000 Encounter for general adult medical examination without abnormal findings: Secondary | ICD-10-CM

## 2021-08-28 MED ORDER — ADVAIR DISKUS 100-50 MCG/ACT IN AEPB
INHALATION_SPRAY | RESPIRATORY_TRACT | 3 refills | Status: DC
Start: 2021-08-28 — End: 2022-04-22
  Filled 2021-08-28: qty 60, 30d supply, fill #0
  Filled 2021-11-27: qty 60, 30d supply, fill #1

## 2021-08-28 MED ORDER — THIAMINE MONONITRATE 100 MG PO TABS
ORAL_TABLET | Freq: Every day | ORAL | 0 refills | Status: DC
  Filled 2021-08-28: qty 30, 30d supply, fill #0

## 2021-08-28 MED ORDER — ALBUTEROL SULFATE HFA 108 (90 BASE) MCG/ACT IN AERS
2.0000 | INHALATION_SPRAY | RESPIRATORY_TRACT | 0 refills | Status: DC | PRN
  Filled 2021-09-18: qty 34, 120d supply, fill #0
  Filled 2021-10-23: qty 25.5, 51d supply, fill #0
  Filled ????-??-??: fill #1

## 2021-08-28 MED ORDER — FOLIC ACID 1 MG PO TABS
ORAL_TABLET | Freq: Every day | ORAL | 1 refills | Status: DC
  Filled 2021-08-28: qty 30, 30d supply, fill #0
  Filled 2021-12-11: qty 30, 30d supply, fill #1

## 2021-08-28 MED ORDER — METOPROLOL TARTRATE 25 MG PO TABS
25.0000 mg | ORAL_TABLET | Freq: Every day | ORAL | 3 refills | Status: DC
  Filled 2021-08-28: qty 30, 30d supply, fill #0

## 2021-08-28 NOTE — Progress Notes (Signed)
Established Patient Office Visit  Subjective:  Patient ID: Miguel [name], male    DOB: 11-Jun-1961  Age: 60 y.o. MRN: [id number]  CC: No chief complaint on file.   HPI Miguel [name] is a 60 y/o male who has history of Alcohol abuse, Anxiety, Asthma, GERD, Gout, Hypertension, Renal calculi presents for routine follow up visit and medication refill. He states that he's compliant with his medication, continues to make healthy lifestyle changes. He had multiple ED visits. He states that he had a glass of wine last week, denies withdrawal symptoms, has not followed up at [facility]. He denies chest pain, palpitation, shortness of breath and dizziness. Overall, he states that he's doing well and offers no further complaint.  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    OCD (obsessive compulsive disorder)    Renal colic     Past Surgical History:  Procedure Laterality Date   CHOLECYSTECTOMY  2012   COLONOSCOPY     COLONOSCOPY WITH PROPOFOL N/A 05/28/2020   Procedure: COLONOSCOPY WITH PROPOFOL;  Surgeon: [name], MD;  Location: [facility] ENDOSCOPY;  Service: Gastroenterology;  Laterality: N/A;   EXTRACORPOREAL SHOCK WAVE LITHOTRIPSY Left 01/12/2019   Procedure: EXTRACORPOREAL SHOCK WAVE LITHOTRIPSY (ESWL);  Surgeon: Billey Co, MD;  Location: [facility] ORS;  Service: Urology;  Laterality: Left;   UPPER GI ENDOSCOPY      Family History  Problem Relation Age of Onset   Alcohol abuse Father    Breast cancer Mother 28   Anxiety disorder Brother    Other Maternal Grandmother        unknown medical history   Other Maternal Grandfather        unknow medical history   Other Paternal Grandmother        unknown medical history   Other Paternal Grandfather         unknown medical history   Healthy Brother     Social History   Socioeconomic History   Marital status: Married    Spouse name: Not on file   Number of children: Not on file   Years of education: Not on file   Highest education level: Not on file  Occupational History   Not on file  Tobacco Use   Smoking status: Former    Types: Cigarettes    Quit date: 03/19/1981    Years since quitting: 40.4   Smokeless tobacco: Never  Vaping Use   Vaping Use: Never used  Substance and Sexual Activity   Alcohol use: Yes    Alcohol/week: 24.0 standard drinks    Types: 24 Shots of liquor per week    Comment: LAst drink Friday 06/20/21   Drug use: Yes    Types: Marijuana   Sexual activity: Not Currently  Other Topics Concern   Not on file  Social History Narrative   Lives at home with his wife and takes care of his wife. Independent at baseline      - Biggest strain is financial but doesn't think they could got get more help.      Patient expressed interest in possible financial assistance. Informed written consent  obtained   Social Determinants of Health   Financial Resource Strain: High Risk   Difficulty of Paying Living Expenses: Very hard  Food Insecurity: No Food Insecurity   Worried About Charity fundraiser in the Last Year: Never true   Ran Out of Food in the Last Year: Never true  Transportation Needs: No Transportation Needs   Lack of Transportation (Medical): No   Lack of Transportation (Non-Medical): No  Physical Activity: Sufficiently Active   Days of Exercise per Week: 7 days   Minutes of Exercise per Session: 60 min  Stress: Stress Concern Present   Feeling of Stress : Very much  Social Connections: Socially Isolated   Frequency of Communication with Friends and Family: Once a week   Frequency of Social Gatherings with Friends and Family: Once a week   Attends Religious Services: Never   Marine scientist or Organizations: No   Attends Programme researcher, broadcasting/film/video: Never   Marital Status: Married  Human resources officer Violence: Not At Risk   Fear of Current or Ex-Partner: No   Emotionally Abused: No   Physically Abused: No   Sexually Abused: No    Outpatient Medications Prior to Visit  Medication Sig Dispense Refill   albuterol (VENTOLIN HFA) 108 (90 Base) MCG/ACT inhaler Inhale 2 puffs into the lungs every 4 (four) hours as needed. 34 g 0   allopurinol (ZYLOPRIM) 300 MG tablet TAKE ONE TABLET BY MOUTH EVERY DAY 90 tablet 1   ascorbic acid (VITAMIN C) 500 MG tablet Take by mouth.     lisinopril (ZESTRIL) 40 MG tablet TAKE ONE TABLET BY MOUTH EVERY DAY 90 tablet 1   loratadine (CLARITIN) 10 MG tablet Take 10 mg by mouth daily.     pantoprazole (PROTONIX) 40 MG tablet Take 1 tablet (40 mg total) by mouth once daily. 30 tablet 11   thiamine (VITAMIN B-1) 100 MG tablet TAKE ONE TABLET BY MOUTH EVERY DAY 90 tablet 0   hydrOXYzine (VISTARIL) 100 MG capsule Take 1 capsule (100 mg total) by mouth 3 (three) times daily as needed (congestion). (Patient not taking: Reported on 08/28/2021) 15 capsule 0   ibuprofen (ADVIL) 800 MG tablet Take 1 tablet (800 mg total) by mouth every 8 (eight) hours as needed. (Patient not taking: Reported on 08/28/2021) 30 tablet 0   mirtazapine (REMERON) 15 MG tablet Take 1 tablet (15 mg total) by mouth at bedtime. 30 tablet 0   ondansetron (ZOFRAN ODT) 4 MG disintegrating tablet Take 1 tablet (4 mg total) by mouth every 8 (eight) hours as needed. (Patient not taking: Reported on 08/28/2021) 20 tablet 0   oxymetazoline (AFRIN) 0.05 % nasal spray Place 2 sprays into both nostrils 2 (two) times daily. (Patient not taking: Reported on 08/28/2021) 15 mL 2   ADVAIR DISKUS 100-50 MCG/ACT AEPB INHALE 1 PUFF 2 TIMES A DAY. RINSE MOUTH AND SPIT AFTER EACH USE. 180 each 3   fluticasone (FLONASE) 50 MCG/ACT nasal spray SPRAY ONE SPRAY INTO BOTH NOSTRILS ONCE DAILY. (Patient not taking: Reported on 57/84/6962) 16 g 0   folic  acid (FOLVITE) 1 MG tablet TAKE ONE TABLET BY MOUTH EVERY DAY 30 tablet 1   HYDROcodone-acetaminophen (NORCO/VICODIN) 5-325 MG tablet Take 1 tablet by mouth every 6 (six) hours as needed. (Patient not taking: Reported on 08/28/2021) 12 tablet 0   hydrOXYzine (ATARAX/VISTARIL) 10 MG tablet Take 1 tablet (10 mg total) by mouth at bedtime as needed (sleep). (Patient not taking: Reported on 08/28/2021)  8 tablet 0   metoprolol tartrate (LOPRESSOR) 25 MG tablet Take 1 tablet (25 mg total) by mouth once daily. 30 tablet 3   pseudoephedrine (SUDAFED) 120 MG 12 hr tablet Take 120 mg by mouth daily. (Patient not taking: Reported on 08/28/2021)     No facility-administered medications prior to visit.    No Known Allergies  ROS Review of Systems  Constitutional: Negative.   Respiratory: Negative.    Cardiovascular: Negative.   Gastrointestinal: Negative.   Neurological: Negative.   Psychiatric/Behavioral: Negative.       Objective:    Physical Exam HENT:     Head: Normocephalic and atraumatic.     Mouth/Throat:     Mouth: Mucous membranes are moist.  Eyes:     Extraocular Movements: Extraocular movements intact.     Conjunctiva/sclera: Conjunctivae normal.     Pupils: Pupils are equal, round, and reactive to light.  Cardiovascular:     Rate and Rhythm: Normal rate and regular rhythm.     Pulses: Normal pulses.     Heart sounds: Normal heart sounds.  Pulmonary:     Effort: Pulmonary effort is normal.     Breath sounds: Normal breath sounds.  Abdominal:     Tenderness: There is no abdominal tenderness. There is no guarding.  Neurological:     General: No focal deficit present.     Mental Status: He is alert and oriented to person, place, and time. Mental status is at baseline.  Psychiatric:        Mood and Affect: Mood normal.        Behavior: Behavior normal.        Thought Content: Thought content normal.        Judgment: Judgment normal.    BP (!) 150/85 (BP Location: Left  Arm, Patient Position: Sitting, Cuff Size: Large)   Pulse 81   Temp 97.7 F (36.5 C)   Resp 16   Wt 188 lb 9.6 oz (85.5 kg)   BMI 25.58 kg/m  Wt Readings from Last 3 Encounters:  08/28/21 188 lb 9.6 oz (85.5 kg)  08/20/21 185 lb (83.9 kg)  08/17/21 185 lb (83.9 kg)     Health Maintenance Due  Topic Date Due   COVID-19 Vaccine (1) Never done   Hepatitis C Screening  Never done   Zoster Vaccines- Shingrix (1 of 2) Never done   COLONOSCOPY (Pts 45-8yr Insurance coverage will need to be confirmed)  05/28/2021   INFLUENZA VACCINE  06/16/2021    There are no preventive care reminders to display for this patient.  Lab Results  Component Value Date   TSH 0.052 (L) 11/29/2020   Lab Results  Component Value Date   WBC 8.1 08/20/2021   HGB 13.8 08/20/2021   HCT 41.2 08/20/2021   MCV 85.8 08/20/2021   PLT 257 08/20/2021   Lab Results  Component Value Date   NA 143 08/20/2021   K 3.7 08/20/2021   CO2 24 08/20/2021   GLUCOSE 110 (H) 08/20/2021   BUN 17 08/20/2021   CREATININE 1.20 08/20/2021   BILITOT 0.6 08/20/2021   ALKPHOS 58 08/20/2021   AST 16 08/20/2021   ALT 15 08/20/2021   PROT 7.2 08/20/2021   ALBUMIN 4.3 08/20/2021   CALCIUM 8.7 (L) 08/20/2021   ANIONGAP 11 08/20/2021   Lab Results  Component Value Date   CHOL 155 05/22/2020   Lab Results  Component Value Date   HDL 65 05/22/2020   Lab Results  Component Value Date   LDLCALC 73 05/22/2020   Lab Results  Component Value Date   TRIG 92 05/22/2020   Lab Results  Component Value Date   CHOLHDL 2.4 05/22/2020   Lab Results  Component Value Date   HGBA1C 5.2 11/29/2020      Assessment & Plan:    1. Alcohol abuse - He declines going to [facility], looking for alternative substance abuse counseling referral. He was encouraged on alcohol abstinence. - folic acid (FOLVITE) 1 MG tablet; TAKE ONE TABLET BY MOUTH EVERY DAY  Dispense: 30 tablet; Refill: 1 - thiamine (VITAMIN B-1) 100 MG tablet; TAKE ONE  TABLET BY MOUTH ONCE EVERY DAY.  Dispense: 90 tablet; Refill: 0  2. Essential hypertension - His blood pressure is not under control, he will continue on current medication, DASH diet and exercise as tolerated. - metoprolol tartrate (LOPRESSOR) 25 MG tablet; Take 1 tablet (25 mg total) by mouth once daily.  Dispense: 30 tablet; Refill: 3  3. Shortness of breath - He will continue on current medication, advised to use Humidifier. - ADVAIR DISKUS 100-50 MCG/ACT AEPB; INHALE 1 PUFF 2 TIMES A DAY. RINSE MOUTH AND SPIT AFTER EACH USE.  Dispense: 180 each; Refill: 3 - albuterol (VENTOLIN HFA) 108 (90 Base) MCG/ACT inhaler; Inhale 2 puffs into the lungs once every 4 (four) hours as needed.  Dispense: 34 g; Refill: 0     Follow-up: Return in about 27 days (around 09/24/2021), or if symptoms worsen or fail to improve.    Hunter Bachar Jerold Coombe, NP

## 2021-08-28 NOTE — Patient Instructions (Signed)

## 2021-08-29 IMAGING — DX DG CHEST 1V PORT
1 series · 1 of 1 positions shown · non-contrast
Comparison: 11/09/2020 and earlier, including CT chest 03/08/2019.

CLINICAL DATA: Acute onset of shortness of breath, dizziness,
generalized weakness and loss of taste that began earlier today.
Current history of asthma and hypertension.

EXAM:
PORTABLE CHEST 1 VIEW

[chest ap]
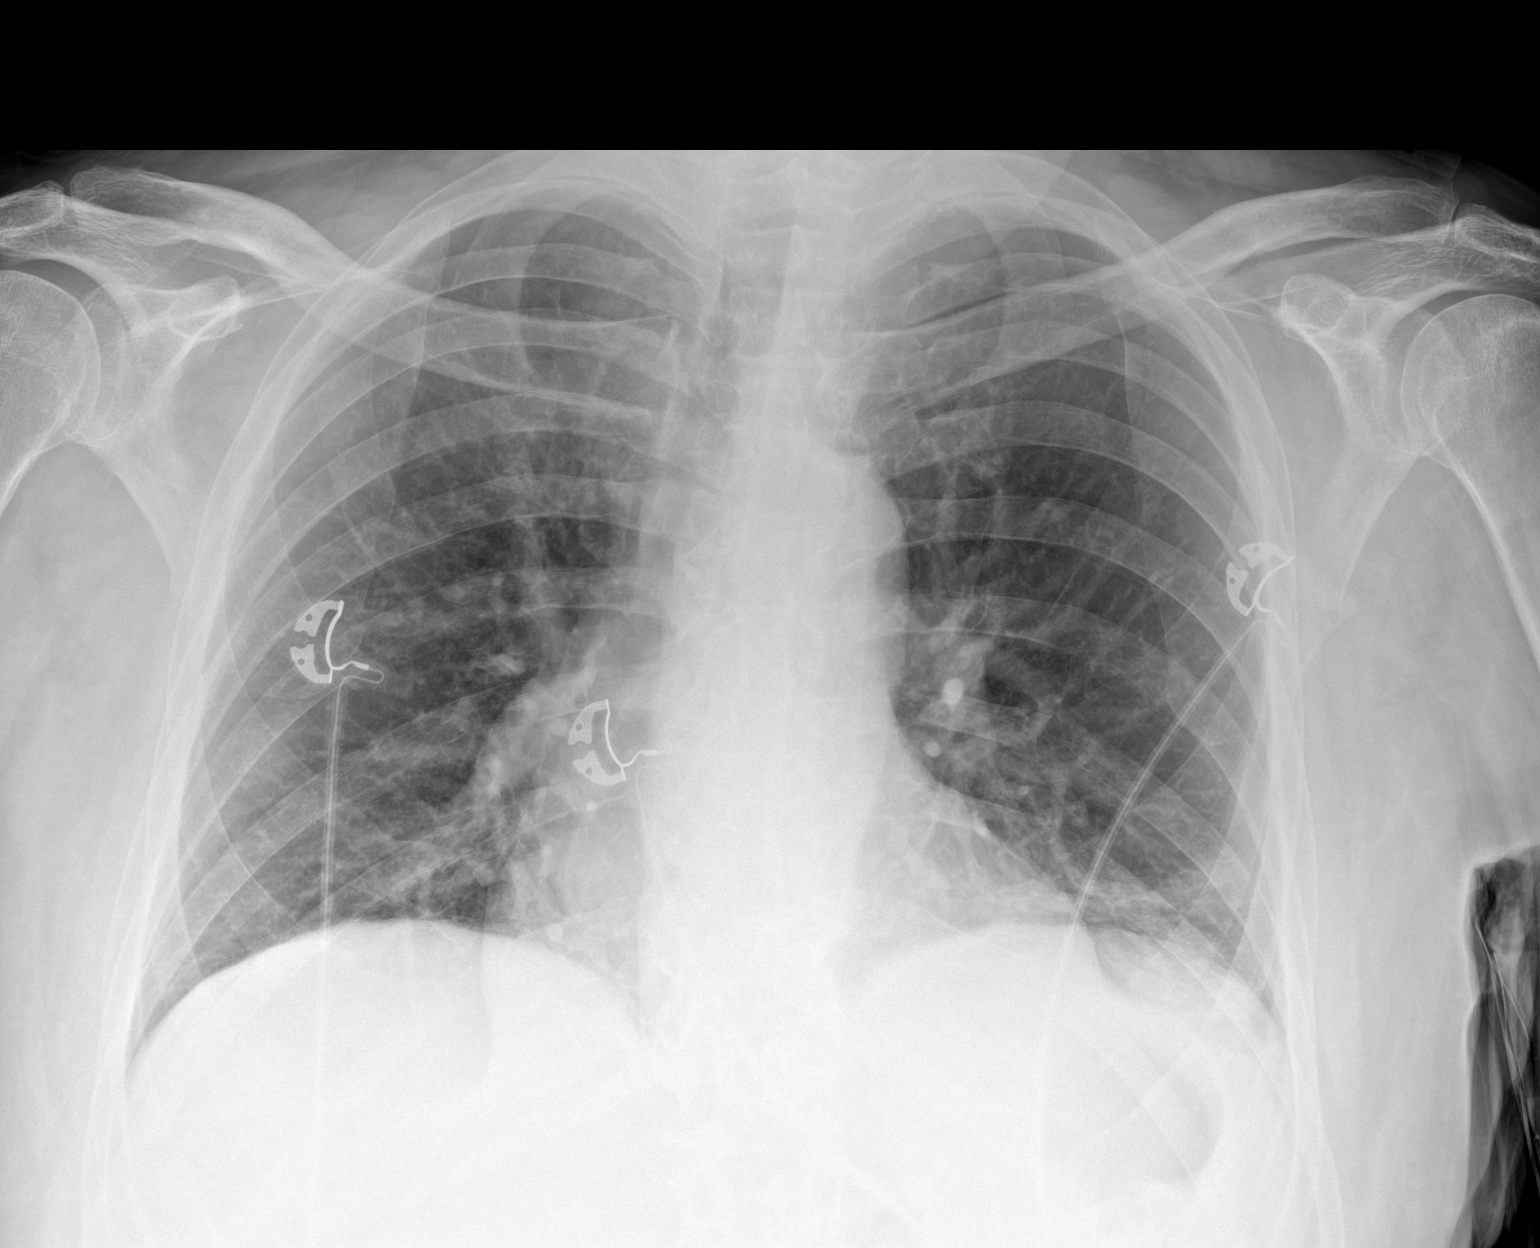

[1 of 1 positions shown; findings below may reference images not displayed]

FINDINGS: Suboptimal inspiration accounts for crowded bronchovascular markings
diffusely and atelectasis in the LEFT lung base, and accentuates the
cardiac silhouette. Taking this into account, cardiac silhouette
upper normal in size. Lungs otherwise clear. No localized airspace
consolidation. No pleural effusions. No pneumothorax. Normal
pulmonary vascularity.
IMPRESSION: Suboptimal inspiration accounts for mild LEFT basilar atelectasis.
No acute cardiopulmonary disease otherwise.

## 2021-09-04 ENCOUNTER — Other Ambulatory Visit: Payer: Self-pay

## 2021-09-04 DIAGNOSIS — Z87891 Personal history of nicotine dependence: Secondary | ICD-10-CM | POA: Insufficient documentation

## 2021-09-04 DIAGNOSIS — J45909 Unspecified asthma, uncomplicated: Secondary | ICD-10-CM | POA: Insufficient documentation

## 2021-09-04 DIAGNOSIS — K2900 Acute gastritis without bleeding: Secondary | ICD-10-CM | POA: Insufficient documentation

## 2021-09-04 DIAGNOSIS — Z20822 Contact with and (suspected) exposure to covid-19: Secondary | ICD-10-CM | POA: Insufficient documentation

## 2021-09-04 DIAGNOSIS — Z79899 Other long term (current) drug therapy: Secondary | ICD-10-CM | POA: Insufficient documentation

## 2021-09-04 DIAGNOSIS — Z7951 Long term (current) use of inhaled steroids: Secondary | ICD-10-CM | POA: Insufficient documentation

## 2021-09-04 DIAGNOSIS — F1010 Alcohol abuse, uncomplicated: Secondary | ICD-10-CM | POA: Insufficient documentation

## 2021-09-04 DIAGNOSIS — I10 Essential (primary) hypertension: Secondary | ICD-10-CM | POA: Insufficient documentation

## 2021-09-04 LAB — CBC
HCT: 40.9 % (ref 39.0–52.0)
Hemoglobin: 13.8 g/dL (ref 13.0–17.0)
MCH: 28.8 pg (ref 26.0–34.0)
MCHC: 33.7 g/dL (ref 30.0–36.0)
MCV: 85.4 fL (ref 80.0–100.0)
Platelets: 247 K/uL (ref 150–400)
RBC: 4.79 MIL/uL (ref 4.22–5.81)
RDW: 16.1 % — ABNORMAL HIGH (ref 11.5–15.5)
WBC: 8.5 K/uL (ref 4.0–10.5)
nRBC: 0 % (ref 0.0–0.2)

## 2021-09-04 LAB — URINALYSIS, ROUTINE W REFLEX MICROSCOPIC
Bacteria, UA: NONE SEEN
Bilirubin Urine: NEGATIVE
Glucose, UA: NEGATIVE mg/dL
Hgb urine dipstick: NEGATIVE
Ketones, ur: NEGATIVE mg/dL
Nitrite: NEGATIVE
Protein, ur: 30 mg/dL — AB
Specific Gravity, Urine: 1.024 (ref 1.005–1.030)
pH: 5 (ref 5.0–8.0)

## 2021-09-04 LAB — COMPREHENSIVE METABOLIC PANEL
ALT: 23 U/L (ref 0–44)
AST: 26 U/L (ref 15–41)
Albumin: 4.4 g/dL (ref 3.5–5.0)
Alkaline Phosphatase: 64 U/L (ref 38–126)
Anion gap: 11 (ref 5–15)
BUN: 17 mg/dL (ref 8–23)
CO2: 24 mmol/L (ref 22–32)
Calcium: 9.1 mg/dL (ref 8.9–10.3)
Chloride: 104 mmol/L (ref 98–111)
Creatinine, Ser: 1.1 mg/dL (ref 0.61–1.24)
GFR, Estimated: >60 mL/min (ref >60)
Glucose, Bld: 113 mg/dL — ABNORMAL HIGH (ref 70–99)
Potassium: 3.7 mmol/L (ref 3.5–5.1)
Sodium: 139 mmol/L (ref 135–145)
Total Bilirubin: 1.1 mg/dL (ref 0.3–1.2)
Total Protein: 7.5 g/dL (ref 6.5–8.1)

## 2021-09-04 LAB — LIPASE, BLOOD: Lipase: 24 U/L (ref 11–51)

## 2021-09-04 MED ORDER — ONDANSETRON 4 MG PO TBDP: 4.0000 mg | ORAL_TABLET | Freq: Once | ORAL | Status: DC

## 2021-09-04 NOTE — ED Provider Notes (Signed)
Emergency Medicine Provider Triage Evaluation Note  Miguel [name] , a 60 y.o. male  was evaluated in triage.  Pt complains of right lower quadrant abdominal pain with nausea and vomiting x1 day.  Denies any fevers, back pain or urinary symptoms.  States he last had alcohol on Tuesday..  Review of Systems  Positive: Abdominal pain, nausea, vomiting Negative: Fevers, chills, back pain, urinary symptoms, chest pain, shortness of breath  Physical Exam  BP (!) 183/108 (BP Location: Left Arm)   Pulse (!) 105   Temp 97.7 F (36.5 C) (Oral)   Resp 16   SpO2 98%  Gen:   Awake, no distress alert and oriented x3 Resp:  Normal effort no wheezing rales or rhonchi MSK:   Moves extremities without difficulty    Medical Decision Making  Medically screening exam initiated at 5:02 PM.  Appropriate orders placed.  [name] was informed that the remainder of the evaluation will be completed by another provider, this initial triage assessment does not replace that evaluation, and the importance of remaining in the ED until their evaluation is complete.  60 year old male with right lower quadrant abdominal pain with nausea and vomiting.  He is afebrile, no distress.  Will get basic blood work, urinalysis and will give nausea medication.   Duanne Guess, PA-C 09/04/21 1704    Naaman Plummer, MD 09/04/21 [phone]

## 2021-09-04 NOTE — ED Triage Notes (Signed)
Pt comes into the ED via EMS from home with c/o RUQ pain today with N/v.Marland Kitchen

## 2021-09-05 ENCOUNTER — Emergency Department: Payer: Self-pay

## 2021-09-05 ENCOUNTER — Emergency Department
Admission: EM | Admit: 2021-09-05 | Discharge: 2021-09-05 | Disposition: A | Discharge status: Home / Self Care | Payer: Self-pay | Attending: Emergency Medicine | Admitting: Emergency Medicine

## 2021-09-05 DIAGNOSIS — F1010 Alcohol abuse, uncomplicated: Secondary | ICD-10-CM

## 2021-09-05 DIAGNOSIS — K2900 Acute gastritis without bleeding: Secondary | ICD-10-CM

## 2021-09-05 DIAGNOSIS — R1011 Right upper quadrant pain: Secondary | ICD-10-CM

## 2021-09-05 DIAGNOSIS — R112 Nausea with vomiting, unspecified: Secondary | ICD-10-CM

## 2021-09-05 LAB — RESP PANEL BY RT-PCR (FLU A&B, COVID) ARPGX2
Influenza A by PCR: NEGATIVE
Influenza B by PCR: NEGATIVE
SARS Coronavirus 2 by RT PCR: NEGATIVE

## 2021-09-05 IMAGING — DX DG CHEST 1V PORT
1 series · 1 of 1 positions shown · non-contrast
Comparison: November 28, 2020

CLINICAL DATA: Shortness of breath.  Dizziness.

EXAM:
PORTABLE CHEST 1 VIEW

[chest ap]
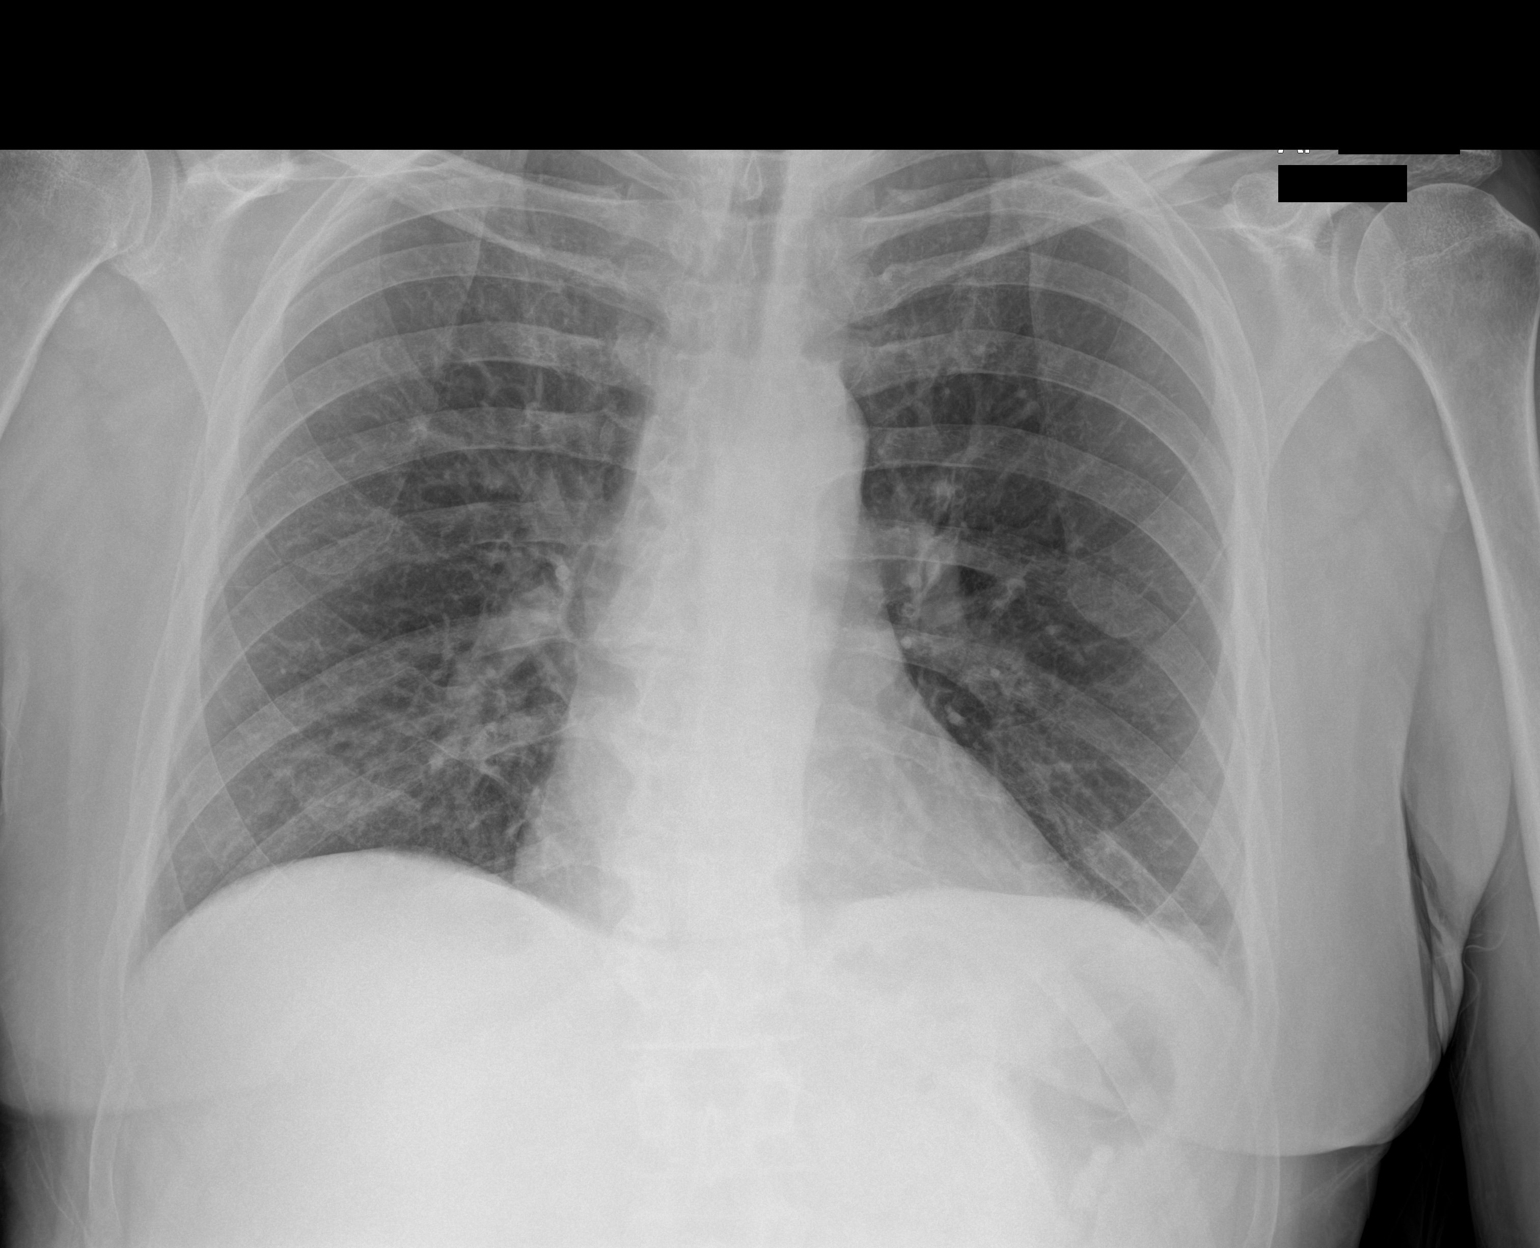

[1 of 1 positions shown; findings below may reference images not displayed]

FINDINGS: The heart size is stable. There is no pneumothorax. No large pleural
effusion. There is atelectasis at the left lung base with a possible
trace left-sided pleural effusion. There is no acute osseous
abnormality.
IMPRESSION: Mild left basilar atelectasis with a possible trace left-sided
pleural effusion.

## 2021-09-05 MED ORDER — LORAZEPAM 2 MG/ML IJ SOLN: 0.0000 mg | Freq: Two times a day (BID) | INTRAMUSCULAR | Status: DC

## 2021-09-05 MED ORDER — ALUM & MAG HYDROXIDE-SIMETH 200-200-20 MG/5ML PO SUSP
30.0000 mL | Freq: Once | ORAL | Status: AC
  Administered 2021-09-05: 30 mL via ORAL
  Filled 2021-09-05: qty 30

## 2021-09-05 MED ORDER — ONDANSETRON HCL 4 MG/2ML IJ SOLN: 4.0000 mg | Freq: Once | INTRAMUSCULAR | Status: AC

## 2021-09-05 MED ORDER — THIAMINE HCL 100 MG/ML IJ SOLN: 100.0000 mg | Freq: Every day | INTRAMUSCULAR | Status: DC

## 2021-09-05 MED ORDER — SUCRALFATE 1 G PO TABS
1.0000 g | ORAL_TABLET | Freq: Once | ORAL | Status: AC
  Administered 2021-09-05: 1 g via ORAL
  Filled 2021-09-05: qty 1

## 2021-09-05 MED ORDER — LORAZEPAM 2 MG PO TABS: 0.0000 mg | ORAL_TABLET | Freq: Two times a day (BID) | ORAL | Status: DC

## 2021-09-05 MED ORDER — THIAMINE HCL 100 MG PO TABS: 100.0000 mg | ORAL_TABLET | Freq: Every day | ORAL | Status: DC

## 2021-09-05 MED ORDER — MORPHINE SULFATE (PF) 4 MG/ML IV SOLN
4.0000 mg | Freq: Once | INTRAVENOUS | Status: AC
  Administered 2021-09-05: 4 mg via INTRAVENOUS
  Filled 2021-09-05: qty 1

## 2021-09-05 MED ORDER — IOHEXOL 350 MG/ML SOLN
100.0000 mL | Freq: Once | INTRAVENOUS | Status: AC | PRN
  Administered 2021-09-05: 100 mL via INTRAVENOUS

## 2021-09-05 MED ORDER — ONDANSETRON 4 MG PO TBDP: 4.0000 mg | ORAL_TABLET | Freq: Three times a day (TID) | ORAL | 0 refills | Status: DC | PRN

## 2021-09-05 MED ORDER — PANTOPRAZOLE SODIUM 40 MG IV SOLR
40.0000 mg | Freq: Once | INTRAVENOUS | Status: AC
  Administered 2021-09-05: 40 mg via INTRAVENOUS
  Filled 2021-09-05: qty 40

## 2021-09-05 MED ORDER — LACTATED RINGERS IV BOLUS
1000.0000 mL | Freq: Once | INTRAVENOUS | Status: AC
  Administered 2021-09-05: 1000 mL via INTRAVENOUS

## 2021-09-05 MED ORDER — LIDOCAINE VISCOUS HCL 2 % MT SOLN
15.0000 mL | OROMUCOSAL | Status: AC
  Administered 2021-09-05: 15 mL via OROMUCOSAL
  Filled 2021-09-05: qty 15

## 2021-09-05 MED ORDER — LORAZEPAM 2 MG/ML IJ SOLN: 0.0000 mg | Freq: Four times a day (QID) | INTRAMUSCULAR | Status: DC

## 2021-09-05 MED ORDER — LORAZEPAM 2 MG PO TABS
0.0000 mg | ORAL_TABLET | Freq: Four times a day (QID) | ORAL | Status: DC
Start: 2021-09-05 — End: 2021-09-05

## 2021-09-05 MED ORDER — ONDANSETRON HCL 4 MG/2ML IJ SOLN
INTRAMUSCULAR | Status: AC
  Administered 2021-09-05: 4 mg via INTRAVENOUS
  Filled 2021-09-05: qty 2

## 2021-09-05 MED ORDER — MORPHINE SULFATE (PF) 4 MG/ML IV SOLN
6.0000 mg | Freq: Once | INTRAVENOUS | Status: AC
  Administered 2021-09-05: 6 mg via INTRAVENOUS
  Filled 2021-09-05: qty 2

## 2021-09-05 MED ORDER — LORAZEPAM 2 MG/ML IJ SOLN
2.0000 mg | Freq: Once | INTRAMUSCULAR | Status: AC
  Administered 2021-09-05: 2 mg via INTRAVENOUS
  Filled 2021-09-05: qty 1

## 2021-09-05 MED ORDER — METOCLOPRAMIDE HCL 5 MG/ML IJ SOLN
10.0000 mg | Freq: Once | INTRAMUSCULAR | Status: AC
  Administered 2021-09-05: 10 mg via INTRAVENOUS
  Filled 2021-09-05: qty 2

## 2021-09-05 NOTE — ED Notes (Signed)
Pt states pain improved, rates it 6/10 at this time. Pt drinking fluids.

## 2021-09-05 NOTE — ED Notes (Signed)
Patient discharged to home per MD order. Patient in stable condition, and deemed medically cleared by ED provider for discharge. Discharge instructions reviewed with patient/family using "Teach Back"; verbalized understanding of medication education and administration, and information about follow-up care. Denies further concerns. ° °

## 2021-09-05 NOTE — ED Notes (Signed)
Pt to CT

## 2021-09-05 NOTE — ED Notes (Signed)
PIV repositioned & is now flowing better.

## 2021-09-05 NOTE — ED Provider Notes (Addendum)
[facility] Emergency Department Provider Note  ____________________________________________   Event Date/Time   First MD Initiated Contact with Patient 09/05/21 0040     (approximate)  I have reviewed the triage vital signs and the nursing notes.   HISTORY  Chief Complaint Abdominal Pain   HPI Miguel [name] is a 60 y.o. male with past medical history of history of Alcohol abuse, Anxiety, Asthma, GERD, Gout, Hypertension, and kidney stone who presents for assessment of some epigastric and right upper abdominal pain that he states began yesterday and is ongoing today associate with some nausea and vomiting.  Patient states he has not had any diarrhea, burning with urination, blood in his urine or stool, chest pain, cough, shortness of breath, headache or earache, sore throat, extremity pain or recent falls or injuries.  States he last drink alcohol on 10/18 when he had about a pint of liquor but has not had any since.  He states he had not been drinking daily before this.  States he has appointment next week to get help with his alcohol use.  No other acute concerns at this time.  He does note he still has his gallbladder.         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    Right ankle pain 06/26/2020   Family history of breast cancer    Hx of colonic polyps    Hospital discharge follow-up 06/05/2020   Kidney function test abnormal 06/05/2020   Abdominal  pain 04/12/2020   Hypertensive urgency 04/12/2020   Alcohol withdrawal ([employer]) 03/04/2020   Right-sided chest wall pain 02/28/2020   Alcohol abuse with alcohol-induced mood disorder ([place]) 12/02/2019   Moderate episode of recurrent major depressive disorder ([place])    Health care maintenance 10/26/2019   Right knee pain 10/26/2019   Generalized anxiety disorder 10/14/2019   MDD (major depressive disorder), recurrent severe, without psychosis ([place]) 07/27/2019   Social anxiety disorder 07/27/2019   Left-sided chest wall pain 93/71/6967   Alcoholic cirrhosis of liver without ascites ([place]) 06/14/2019   Alcohol abuse 06/14/2019   AKI (acute kidney injury) ([place]) 05/27/2019   Alcohol withdrawal syndrome without complication ([place]) 89/38/1017   Alcoholic intoxication without complication ([place])    Sepsis ([place]) 03/08/2019   Breast lump or mass 10/04/2018   Sepsis secondary to UTI ([place]) 09/14/2018   Asthma 07/07/2018   GERD (gastroesophageal reflux disease) 07/07/2018   OCD (obsessive compulsive disorder) 51/12/5850   Self-inflicted laceration of left wrist ([place]) 03/10/2018   Leg hematoma 12/25/2017   Suicide and self-inflicted injury by cutting and piercing instrument ([place]) 03/10/2017   Severe recurrent major 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 ([place]) 03/09/2017   Substance induced mood disorder ([place]) 08/15/2016   Involuntary commitment 08/15/2016   Alcohol use disorder, severe, dependence ([place]) 02/05/2016   Hypertension 12/05/2015   Tachycardia  12/05/2015   Gout 11/13/2015   Chronic back pain 05/02/2015    Past Surgical History:  Procedure Laterality Date   CHOLECYSTECTOMY  2012   COLONOSCOPY     COLONOSCOPY WITH PROPOFOL N/A 05/28/2020   Procedure: COLONOSCOPY WITH PROPOFOL;  Surgeon: [name], MD;  Location: [facility] ENDOSCOPY;  Service: Gastroenterology;  Laterality: N/A;   EXTRACORPOREAL SHOCK WAVE LITHOTRIPSY Left 01/12/2019   Procedure: EXTRACORPOREAL SHOCK WAVE LITHOTRIPSY (ESWL);   Surgeon: Billey Co, MD;  Location: [facility] ORS;  Service: Urology;  Laterality: Left;   UPPER GI ENDOSCOPY      Prior to Admission medications   Medication Sig Start Date End Date Taking? Authorizing Provider  ADVAIR DISKUS 100-50 MCG/ACT AEPB INHALE 1 PUFF 2 TIMES A DAY. RINSE MOUTH AND SPIT AFTER EACH USE. 08/28/21 08/28/22  Iloabachie, Chioma E, NP  albuterol (VENTOLIN HFA) 108 (90 Base) MCG/ACT inhaler Inhale 2 puffs into the lungs every 4 (four) hours as needed. 08/19/21 08/19/22  Iloabachie, Chioma E, NP  albuterol (VENTOLIN HFA) 108 (90 Base) MCG/ACT inhaler Inhale 2 puffs into the lungs once every 4 (four) hours as needed. 08/28/21 08/28/22  Iloabachie, Chioma E, NP  allopurinol (ZYLOPRIM) 300 MG tablet TAKE ONE TABLET BY MOUTH EVERY DAY 04/15/21 11/01/21  Iloabachie, Chioma E, NP  ascorbic acid (VITAMIN C) 500 MG tablet Take by mouth. 08/01/19   [provider]  folic acid (FOLVITE) 1 MG tablet TAKE ONE TABLET BY MOUTH EVERY DAY 08/28/21 08/28/22  Iloabachie, Chioma E, NP  hydrOXYzine (VISTARIL) 100 MG capsule Take 1 capsule (100 mg total) by mouth 3 (three) times daily as needed (congestion). Patient not taking: Reported on 08/28/2021 07/28/21   Cuthriell, Charline Bills, PA-C  ibuprofen (ADVIL) 800 MG tablet Take 1 tablet (800 mg total) by mouth every 8 (eight) hours as needed. Patient not taking: Reported on 08/28/2021 08/09/21   Rudene Re, MD  lisinopril (ZESTRIL) 40 MG tablet TAKE ONE TABLET BY MOUTH EVERY DAY 04/15/21 04/15/22  Iloabachie, Chioma E, NP  loratadine (CLARITIN) 10 MG tablet Take 10 mg by mouth daily.    [provider]  metoprolol tartrate (LOPRESSOR) 25 MG tablet Take 1 tablet (25 mg total) by mouth once daily. 08/28/21 09/27/21  Iloabachie, Chioma E, NP  mirtazapine (REMERON) 15 MG tablet Take 1 tablet (15 mg total) by mouth at bedtime. 05/21/21 06/25/21  Iloabachie, Chioma E, NP  ondansetron (ZOFRAN ODT) 4 MG disintegrating tablet Take 1 tablet (4  mg total) by mouth every 8 (eight) hours as needed. 09/05/21   [name], MD  oxymetazoline (AFRIN) 0.05 % nasal spray Place 2 sprays into both nostrils 2 (two) times daily. Patient not taking: Reported on 08/28/2021 08/17/21 08/17/22  Nance Pear, MD  pantoprazole (PROTONIX) 40 MG tablet Take 1 tablet (40 mg total) by mouth once daily. 06/28/21 06/28/22  [name], MD  thiamine (VITAMIN B-1) 100 MG tablet TAKE ONE TABLET BY MOUTH ONCE EVERY DAY. 08/28/21 08/28/22  Iloabachie, Chioma E, NP    Allergies Patient has no known allergies.  Family History  Problem Relation Age of Onset   Alcohol abuse Father    Breast cancer Mother 69   Anxiety disorder Brother    Other Maternal Grandmother        unknown medical history   Other Maternal Grandfather        unknow medical history   Other Paternal Grandmother        unknown medical history   Other Paternal Grandfather  unknown medical history   Healthy Brother     Social History Social History   Tobacco Use   Smoking status: Former    Types: Cigarettes    Quit date: 03/19/1981    Years since quitting: 40.4   Smokeless tobacco: Never  Vaping Use   Vaping Use: Never used  Substance Use Topics   Alcohol use: Yes    Alcohol/week: 24.0 standard drinks    Types: 24 Shots of liquor per week    Comment: LAst drink Friday 06/20/21   Drug use: Yes    Types: Marijuana    Review of Systems  Review of Systems  Constitutional:  Negative for chills and fever.  HENT:  Negative for sore throat.   Eyes:  Negative for pain.  Respiratory:  Negative for cough and stridor.   Cardiovascular:  Negative for chest pain.  Gastrointestinal:  Positive for abdominal pain, nausea and vomiting.  Genitourinary:  Negative for dysuria.  Musculoskeletal:  Negative for myalgias.  Skin:  Negative for rash.  Neurological:  Negative for seizures, loss of consciousness and headaches.  Psychiatric/Behavioral:  Negative for suicidal ideas.    All other systems reviewed and are negative.    ____________________________________________   PHYSICAL EXAM:  VITAL SIGNS: ED Triage Vitals  Enc Vitals Group     BP 09/04/21 1657 (!) 183/108     Pulse Rate 09/04/21 1657 (!) 105     Resp 09/04/21 1658 16     Temp 09/04/21 1657 97.7 F (36.5 C)     Temp Source 09/04/21 1657 Oral     SpO2 09/04/21 1657 98 %     Weight --      Height --      Head Circumference --      Peak Flow --      Pain Score 09/04/21 1538 7     Pain Loc --      Pain Edu? --      Excl. in [place]? --    Vitals:   09/05/21 0300 09/05/21 0430  BP: (!) 165/100 (!) 179/100  Pulse: (!) 115 96  Resp: 18 18  Temp:    SpO2: 100% 100%   Physical Exam Vitals and nursing note reviewed.  Constitutional:      Appearance: He is well-developed. He is ill-appearing.  HENT:     Head: Normocephalic and atraumatic.     Right Ear: External ear normal.     Left Ear: External ear normal.     Nose: Nose normal.     Mouth/Throat:     Mouth: Mucous membranes are dry.  Eyes:     Conjunctiva/sclera: Conjunctivae normal.  Cardiovascular:     Rate and Rhythm: Regular rhythm. Bradycardia present.     Heart sounds: No murmur heard. Pulmonary:     Effort: Pulmonary effort is normal. No respiratory distress.     Breath sounds: Normal breath sounds.  Abdominal:     Palpations: Abdomen is soft.     Tenderness: There is abdominal tenderness in the right upper quadrant and epigastric area. There is no right CVA tenderness or left CVA tenderness.  Musculoskeletal:     Cervical back: Neck supple.  Skin:    General: Skin is warm and dry.     Capillary Refill: Capillary refill takes 2 to 3 seconds.  Neurological:     Mental Status: He is alert and oriented to person, place, and time.  Psychiatric:        Mood and Affect:  Mood normal.     ____________________________________________   LABS (all labs ordered are listed, but only abnormal results are displayed)  Labs  Reviewed  COMPREHENSIVE METABOLIC PANEL - Abnormal; Notable for the following components:      Result Value   Glucose, Bld 113 (*)    All other components within normal limits  CBC - Abnormal; Notable for the following components:   RDW 16.1 (*)    All other components within normal limits  URINALYSIS, ROUTINE W REFLEX MICROSCOPIC - Abnormal; Notable for the following components:   Color, Urine YELLOW (*)    APPearance CLOUDY (*)    Protein, ur 30 (*)    Leukocytes,Ua TRACE (*)    All other components within normal limits  RESP PANEL BY RT-PCR (FLU A&B, COVID) ARPGX2  LIPASE, BLOOD   ____________________________________________  EKG  ECG shows sinus tachycardia with a ventricular rate of 108, normal axis, unremarkable intervals with some artifact in V2 without clear evidence of acute ischemia. ____________________________________________  RADIOLOGY  ED MD interpretation: CT abdomen pelvis shows no evidence of diverticulitis, perinephric stranding, ureteral stone and stable appearance of previously noted bilateral renal stones.  No evidence of diverticulitis, appendicitis, pancreatitis or other acute abdominal pelvic process.  JJHERDEYCXKGY shows patient is status postcholecystectomy without acute process.  Official radiology report(s): CT ABDOMEN PELVIS W CONTRAST  Result Date: 09/05/2021 CLINICAL DATA:  Right upper quadrant pain with nausea and vomiting, initial encounter EXAM: CT ABDOMEN AND PELVIS WITH CONTRAST TECHNIQUE: Multidetector CT imaging of the abdomen and pelvis was performed using the standard protocol following bolus administration of intravenous contrast. CONTRAST:  151m OMNIPAQUE IOHEXOL 350 MG/ML SOLN COMPARISON:  Ultrasound from earlier in the same day. FINDINGS: Lower chest: No acute abnormality. Hepatobiliary: Fatty infiltration of the liver is noted. The gallbladder has been surgically removed. Pancreas: Unremarkable. No pancreatic ductal dilatation or  surrounding inflammatory changes. Spleen: Normal in size without focal abnormality. Adrenals/Urinary Tract: Adrenal glands are within normal limits. Kidneys demonstrate multiple bilateral nonobstructing renal calculi. The largest of these on the right is in the lower pole measuring 6 mm. Largest on the left is noted in the upper pole measuring 4 mm. Ureters appear within normal limits. No obstructive changes are noted. The bladder is partially distended. Stomach/Bowel: Scattered diverticular change of the colon is noted. Mild smooth muscle thickening is seen. No obstructive changes in the colon are seen. The appendix is within normal limits. Small bowel and stomach appear within normal limits with the exception of a duodenal diverticulum adjacent to the head of the pancreas. Vascular/Lymphatic: Aortic atherosclerosis. No enlarged abdominal or pelvic lymph nodes. Reproductive: Prostate is unremarkable. Other: No abdominal wall hernia or abnormality. No abdominopelvic ascites. Musculoskeletal: Mild degenerative changes of lumbar spine are seen. No acute bony abnormality is noted. IMPRESSION: Fatty liver. Multiple bilateral renal calculi relatively stable from the prior exam. No obstructive changes are seen. Diverticulosis without diverticulitis. Electronically Signed   By: MInez CatalinaM.D.   On: 09/05/2021 03:40   [place]ABDOMEN LIMITED RUQ (LIVER/GB)  Result Date: 09/05/2021 CLINICAL DATA:  Right upper quadrant pain for 1 day EXAM: ULTRASOUND ABDOMEN LIMITED RIGHT UPPER QUADRANT COMPARISON:  08/08/2021 CT FINDINGS: Gallbladder: Surgically removed. Common bile duct: Diameter: 8.8 mm consistent with the post cholecystectomy state. Liver: No focal lesion identified. Within normal limits in parenchymal echogenicity. Portal vein is patent on color Doppler imaging with normal direction of blood flow towards the liver. Other: None. IMPRESSION: Status post cholecystectomy.  No acute abnormality  noted. Electronically Signed    By: Mark  Lukens M.D.   On: 09/05/2021 02:18    ____________________________________________   PROCEDURES  Procedure(s) performed (including Critical Care):  Procedures   ____________________________________________   INITIAL IMPRESSION / ASSESSMENT AND PLAN / ED COURSE      Patient presents with above-stated history exam for assessment of 2 days of some epigastric quarter quadrant abdominal discomfort associate with nausea and vomiting in the setting of a binge drinking episode that seems to be a recurrent issue for the patient.  On arrival here hypertensive with a BP of 183/108 and tachycardic at 105 with otherwise stable vital signs on room air.  He does have some mild tenderness in the right upper quadrant epigastrium and otherwise appears mildly dehydrated.  No significant lower or left-sided abdominal tenderness.  No CVA tenderness.  Primary differential includes atypical presentation for ACS, peptic ulcer disease, alcoholic gastritis, pancreatitis, cholecystitis, and duodenitis.  No lower abdominal discomfort or tenderness to suggest appendicitis at this time.  There is no diarrhea fevers, leukocytosis or lower abdominal comfort to suggest colonic diverticulitis.  No CVA tenderness fever or leukocytosis or urinary symptoms to suggest cystitis or pyelonephritis.  CMP shows no significant electrolyte or metabolic derangements.  No evidence of hepatitis or cholestasis.  Lipase not consistent with acute pancreatitis.  CBC shows no leukocytosis or acute anemia.  UA with protein and trace leukocyte esterase but no blood or other convincing evidence of urinary source of infection.  CT abdomen pelvis shows no evidence of diverticulitis, perinephric stranding, ureteral stone and stable appearance of previously noted bilateral renal stones.  No evidence of diverticulitis, appendicitis, pancreatitis or other acute abdominal pelvic process.  RUQultrasound shows patient is status  postcholecystectomy without acute process.  Patient given below noted antiemetics analgesia and antacids on reassessment he was feeling much better and was eventually able tolerate some p.o.  Given otherwise reassuring exam and work-up I suspect likely acute gastritis exacerbated by recent binge drinking.  Discussed patient's blood pressure being quite elevated today and recommendation for him to resume his home BP meds when he gets home as he does not seem to be symptomatic from this at this time.  Also discussed at length dangers of excessive alcohol use and importance of taking all his medicines as directed and close outpatient PCP follow-up.  Given otherwise stable vitals with patient stating he is feeling much better and able to tolerate p.o. I think he is stable for discharge.  Discharged stable condition.  Strict return precautions advised and discussed.  Rx for Zofran written.     ____________________________________________   FINAL CLINICAL IMPRESSION(S) / ED DIAGNOSES  Final diagnoses:  RUQ abdominal pain  Nausea and vomiting, unspecified vomiting type  Acute gastritis, presence of bleeding unspecified, unspecified gastritis type  Alcohol abuse    Medications  LORazepam (ATIVAN) injection 0-4 mg (0 mg Intravenous Not Given 09/05/21 0147)    Or  LORazepam (ATIVAN) tablet 0-4 mg ( Oral See Alternative 09/05/21 0147)  LORazepam (ATIVAN) injection 0-4 mg (has no administration in time range)    Or  LORazepam (ATIVAN) tablet 0-4 mg (has no administration in time range)  thiamine tablet 100 mg (has no administration in time range)    Or  thiamine (B-1) injection 100 mg (has no administration in time range)  lactated ringers bolus 1,000 mL (1,000 mLs Intravenous New Bag/Given 09/05/21 0134)  morphine 4 MG/ML injection 6 mg (6 mg Intravenous Given 09/05/21 0135)  pantoprazole (PROTONIX) injection 40   mg (40 mg Intravenous Given 09/05/21 0134)  sucralfate (CARAFATE) tablet 1 g (1 g  Oral Given 09/05/21 0135)  alum & mag hydroxide-simeth (MAALOX/MYLANTA) 200-200-20 MG/5ML suspension 30 mL (30 mLs Oral Given 09/05/21 0134)  morphine 4 MG/ML injection 4 mg (4 mg Intravenous Given 09/05/21 0233)  iohexol (OMNIPAQUE) 350 MG/ML injection 100 mL (100 mLs Intravenous Contrast Given 09/05/21 0326)  ondansetron (ZOFRAN) injection 4 mg (4 mg Intravenous Given 09/05/21 0321)  metoCLOPramide (REGLAN) injection 10 mg (10 mg Intravenous Given 09/05/21 0424)  LORazepam (ATIVAN) injection 2 mg (2 mg Intravenous Given 09/05/21 0424)  lidocaine (XYLOCAINE) 2 % viscous mouth solution 15 mL (15 mLs Mouth/Throat Given 09/05/21 0432)     ED Discharge Orders          Ordered    ondansetron (ZOFRAN ODT) 4 MG disintegrating tablet  Every 8 hours PRN        09/05/21 0515             Note:  This document was prepared using Dragon voice recognition software and may include unintentional dictation errors.    [name], MD 09/05/21 [id number]    [name], MD 09/05/21 [id number]

## 2021-09-10 ENCOUNTER — Other Ambulatory Visit: Payer: Self-pay

## 2021-09-11 IMAGING — CT CT HEAD W/O CM
3 series · 15 of 47 positions shown, 18 images · non-contrast
Comparison: CT brain and cervical spine 10/23/2020

CLINICAL DATA: Tripped pain under the right eye

EXAM:
CT HEAD WITHOUT CONTRAST
CT CERVICAL SPINE WITHOUT CONTRAST
TECHNIQUE: Multidetector CT imaging of the head and cervical spine was
performed following the standard protocol without intravenous
contrast. Multiplanar CT image reconstructions of the cervical spine
were also generated.

[Series 2: head wo · axial · 0.45mm/px · z∈[-174,-44]mm · 9 of 32 slices shown, 12 images]
[im 3/32  brain]
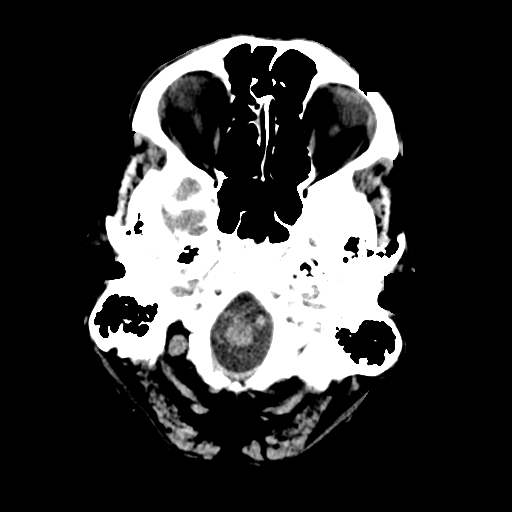
[im 3/32  bone]
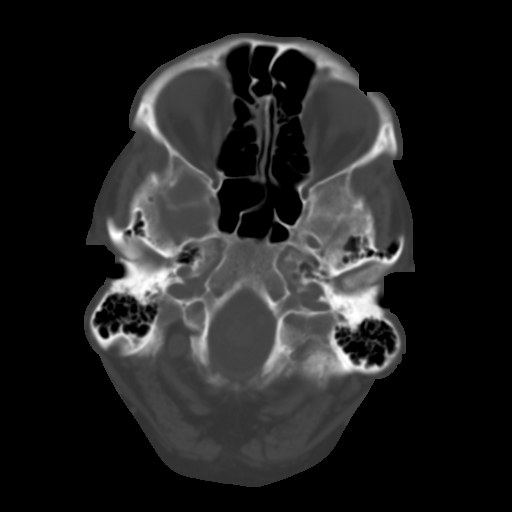
[im 6/32  brain]
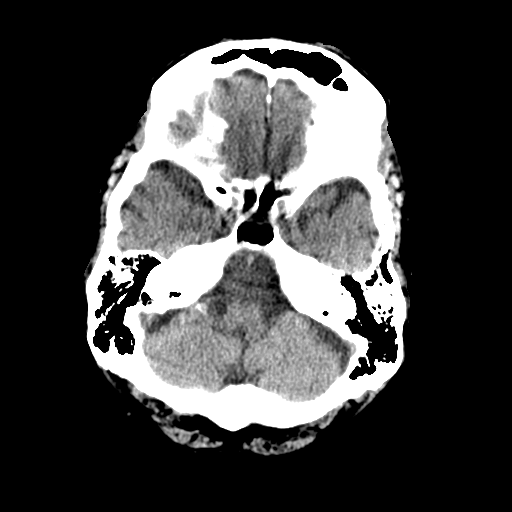
[im 9/32  brain]
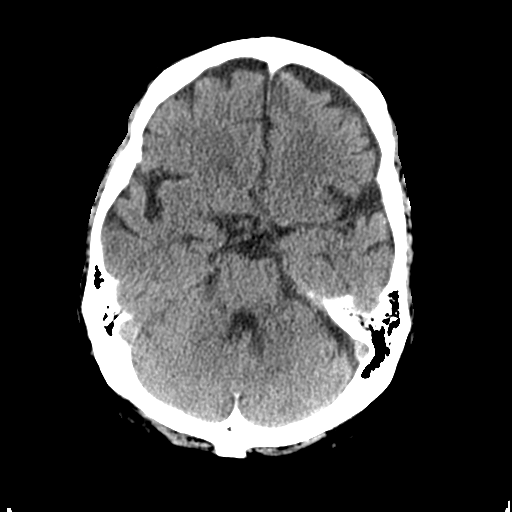
[im 12/32  brain]
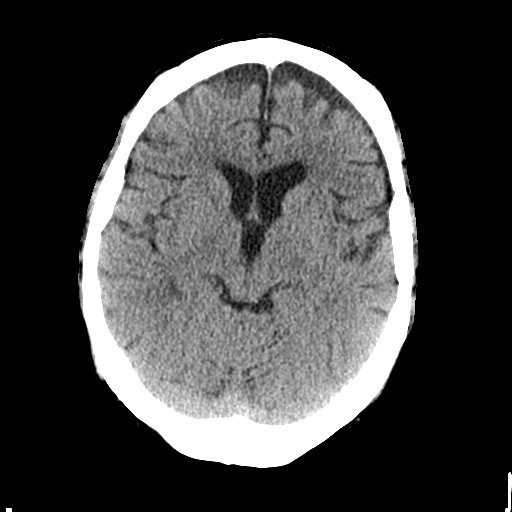
[im 17/32  brain]
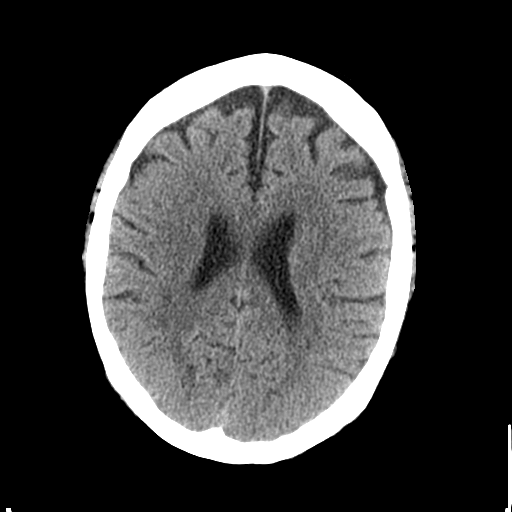
[im 17/32  bone]
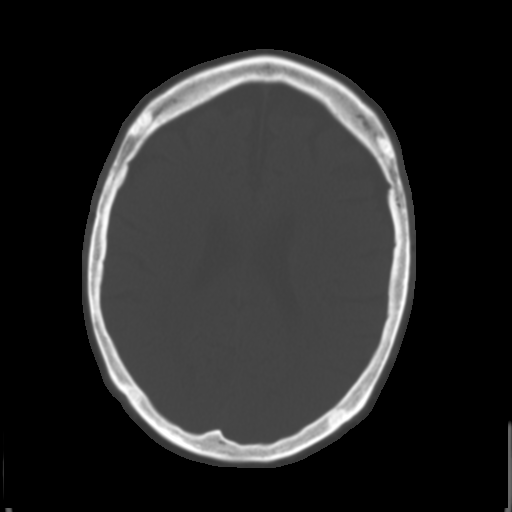
[im 20/32  brain]
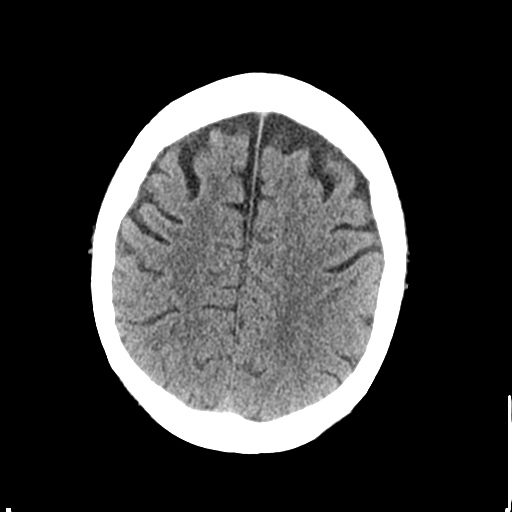
[im 23/32  brain]
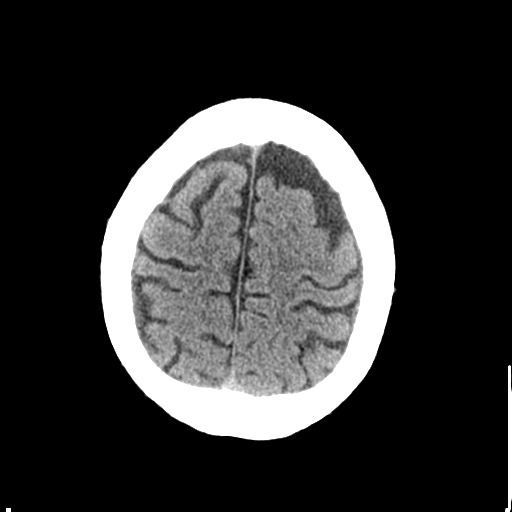
[im 26/32  brain]
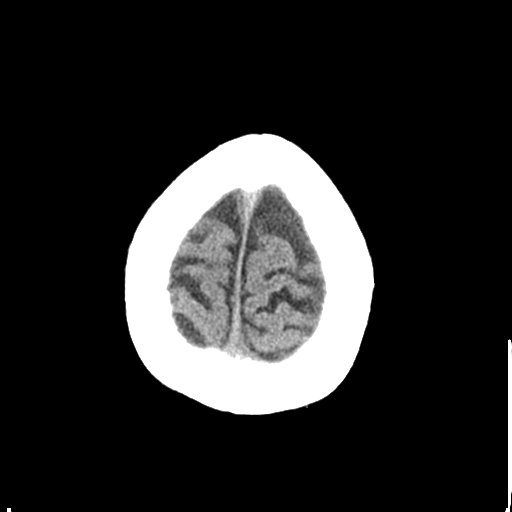
[im 29/32  brain]
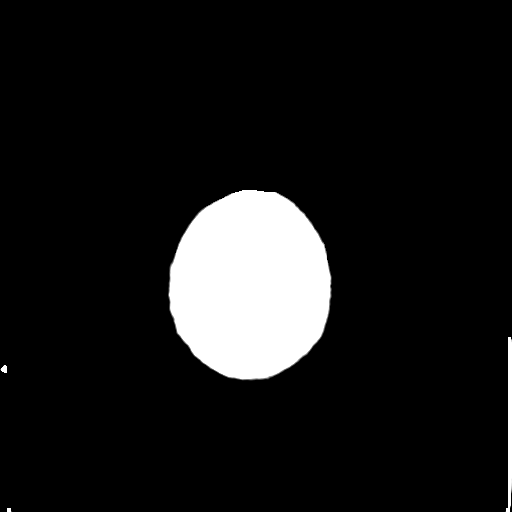
[im 29/32  bone]
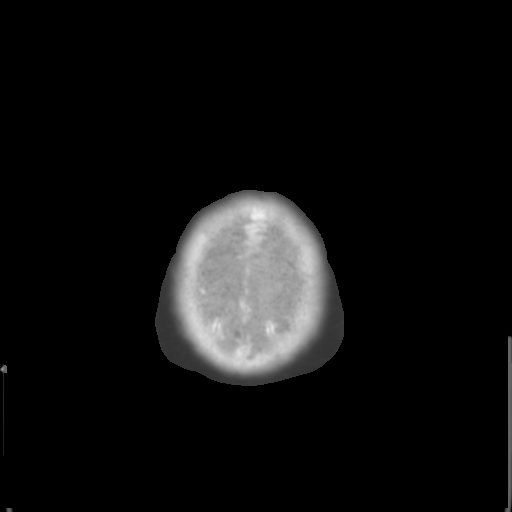

[Series 4: coronal soft tissue · coronal · 0.35mm/px · 3 of 67 slices shown]
[im 23/67  brain]
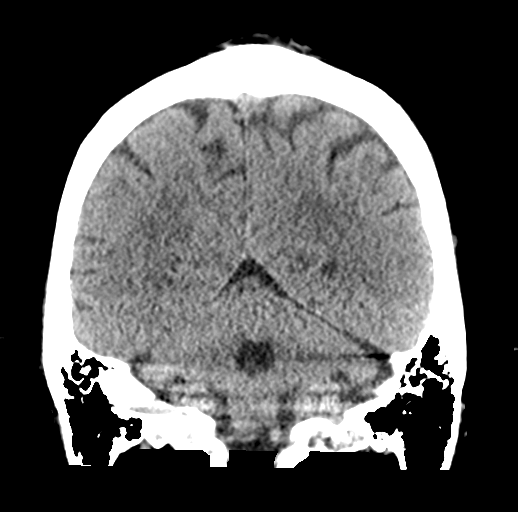
[im 30/67  brain]
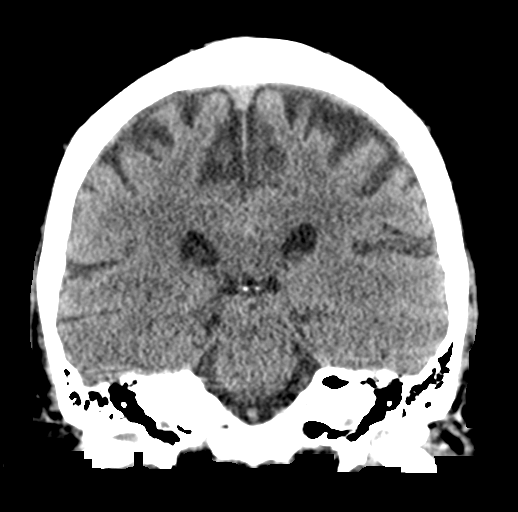
[im 37/67  brain]
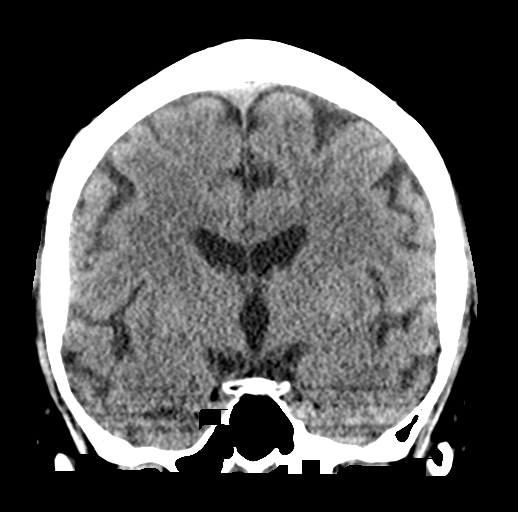

[Series 5: sagittal soft tissue · sagittal · 0.35mm/px · 3 of 58 slices shown]
[im 20/58  brain]
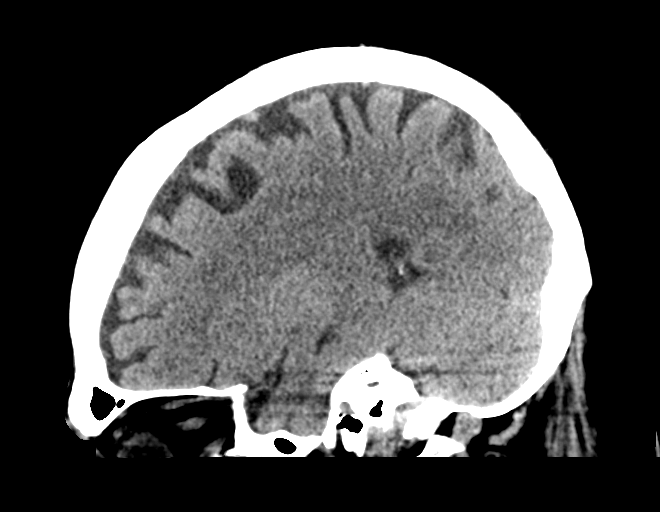
[im 29/58  brain]
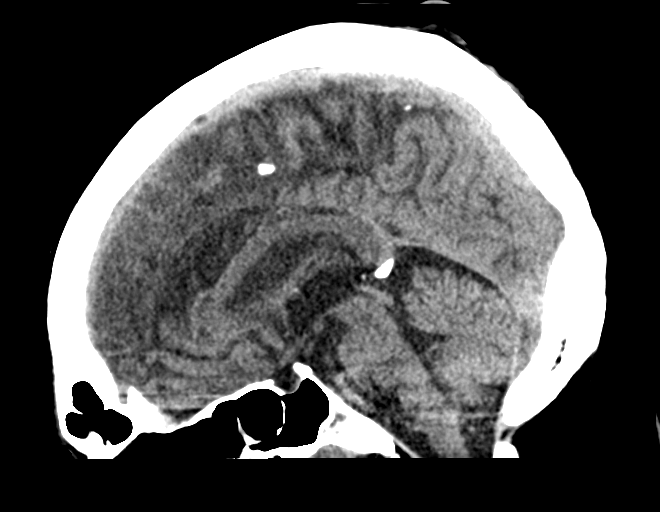
[im 39/58  brain]
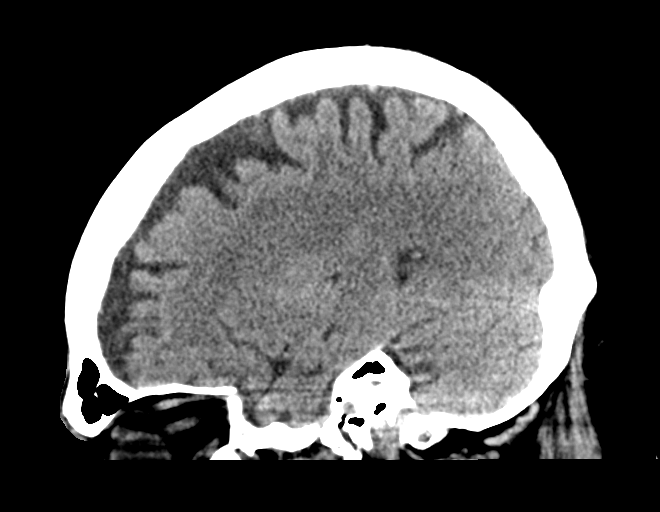

[15 of 47 positions shown; findings below may reference images not displayed]

FINDINGS: CT HEAD FINDINGS

Brain: No acute territorial infarction, hemorrhage, or intracranial
mass. Mild atrophy. Stable ventricle size. Minimal chronic small
vessel ischemic change of the white matter

Vascular: No hyperdense vessels.  No unexpected calcification

Skull: Normal. Negative for fracture or focal lesion.

Sinuses/Orbits: No acute finding.

Other: Mild left forehead soft tissue swelling

CT CERVICAL SPINE FINDINGS

Alignment: Straightening of the cervical spine. No subluxation.
Facet alignment within normal limits

Skull base and vertebrae: No acute fracture. No primary bone lesion
or focal pathologic process.

Soft tissues and spinal canal: No prevertebral fluid or swelling. No
visible canal hematoma.

Disc levels: Multiple level degenerative change. Mild disc space
narrowing C5-C6 and C6-C7.

Upper chest: Negative.

Other: None
IMPRESSION: 1. No CT evidence for acute intracranial abnormality.
2. Straightening of the cervical spine with degenerative changes. No
acute osseous abnormality.

## 2021-09-11 IMAGING — DX DG CHEST 1V
1 series · 2 of 2 positions shown · non-contrast
Comparison: 12/05/2020

CLINICAL DATA: Shortness of breath

EXAM:
CHEST  1 VIEW

[Series 1: chest ap · 0.14mm/px · 2 of 2 slices shown]
[im 1/2]
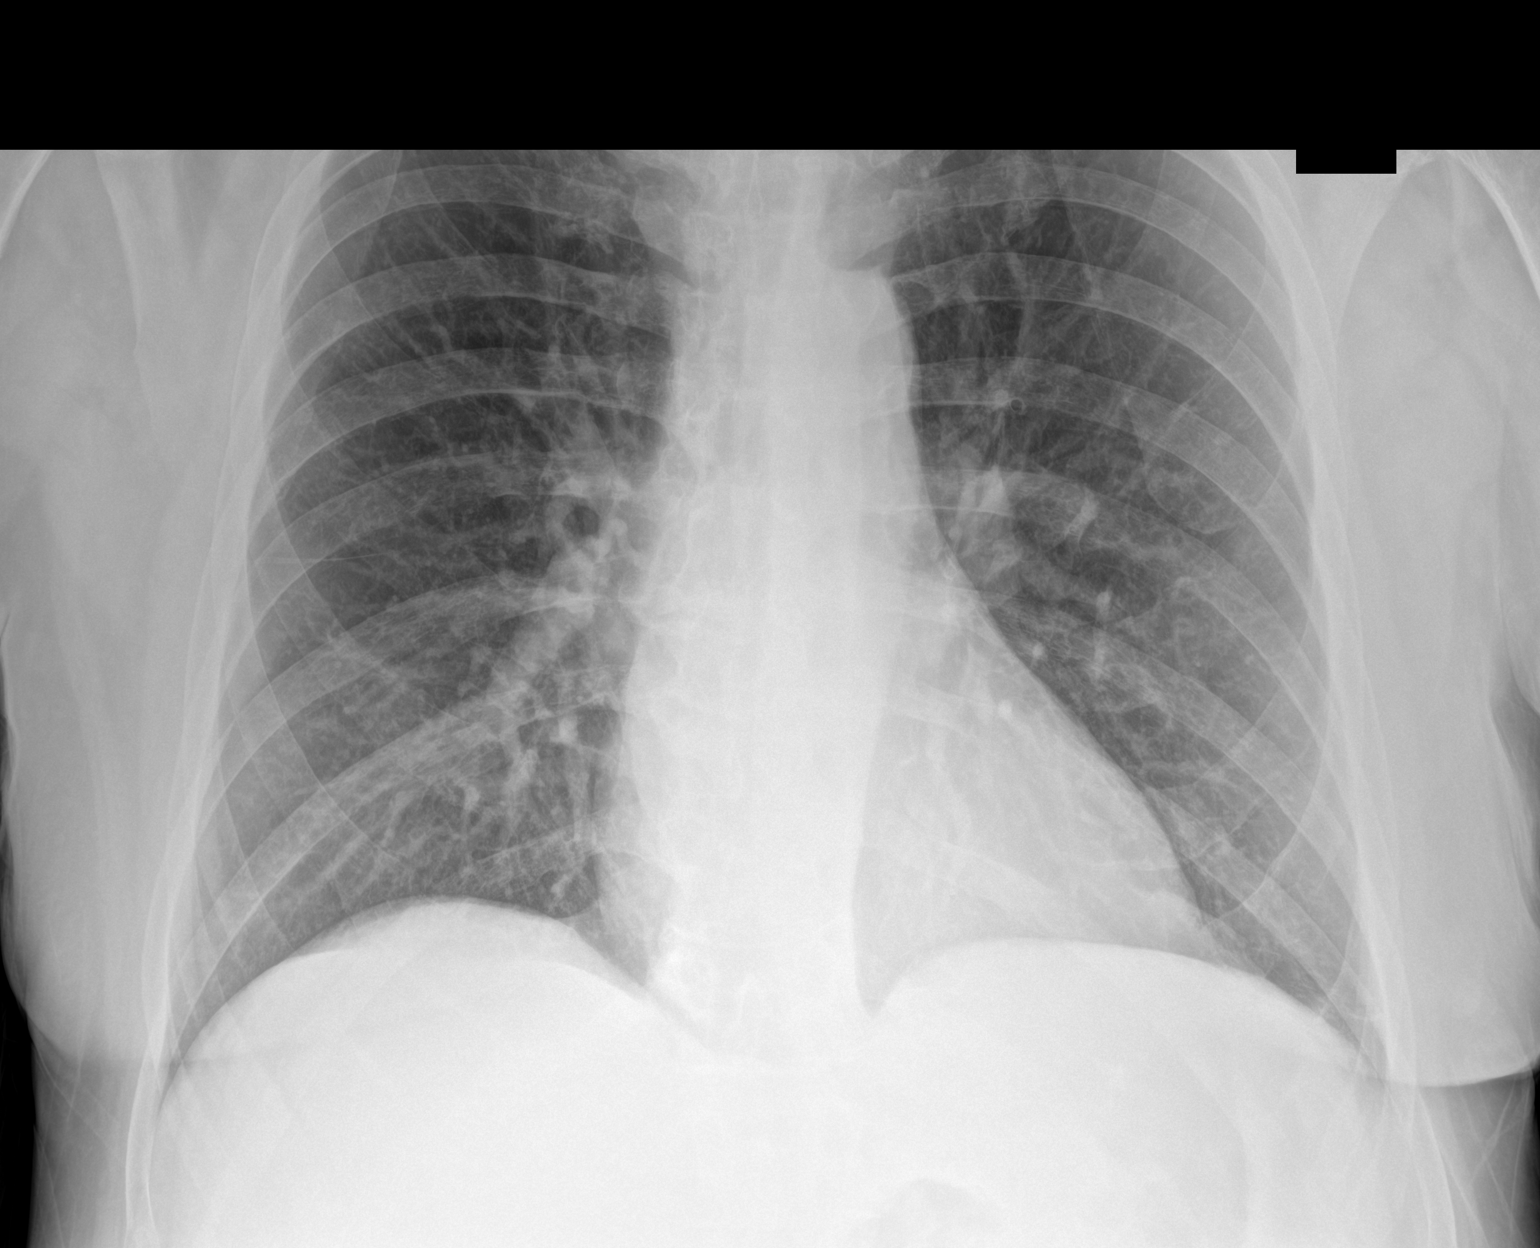
[im 2/2]
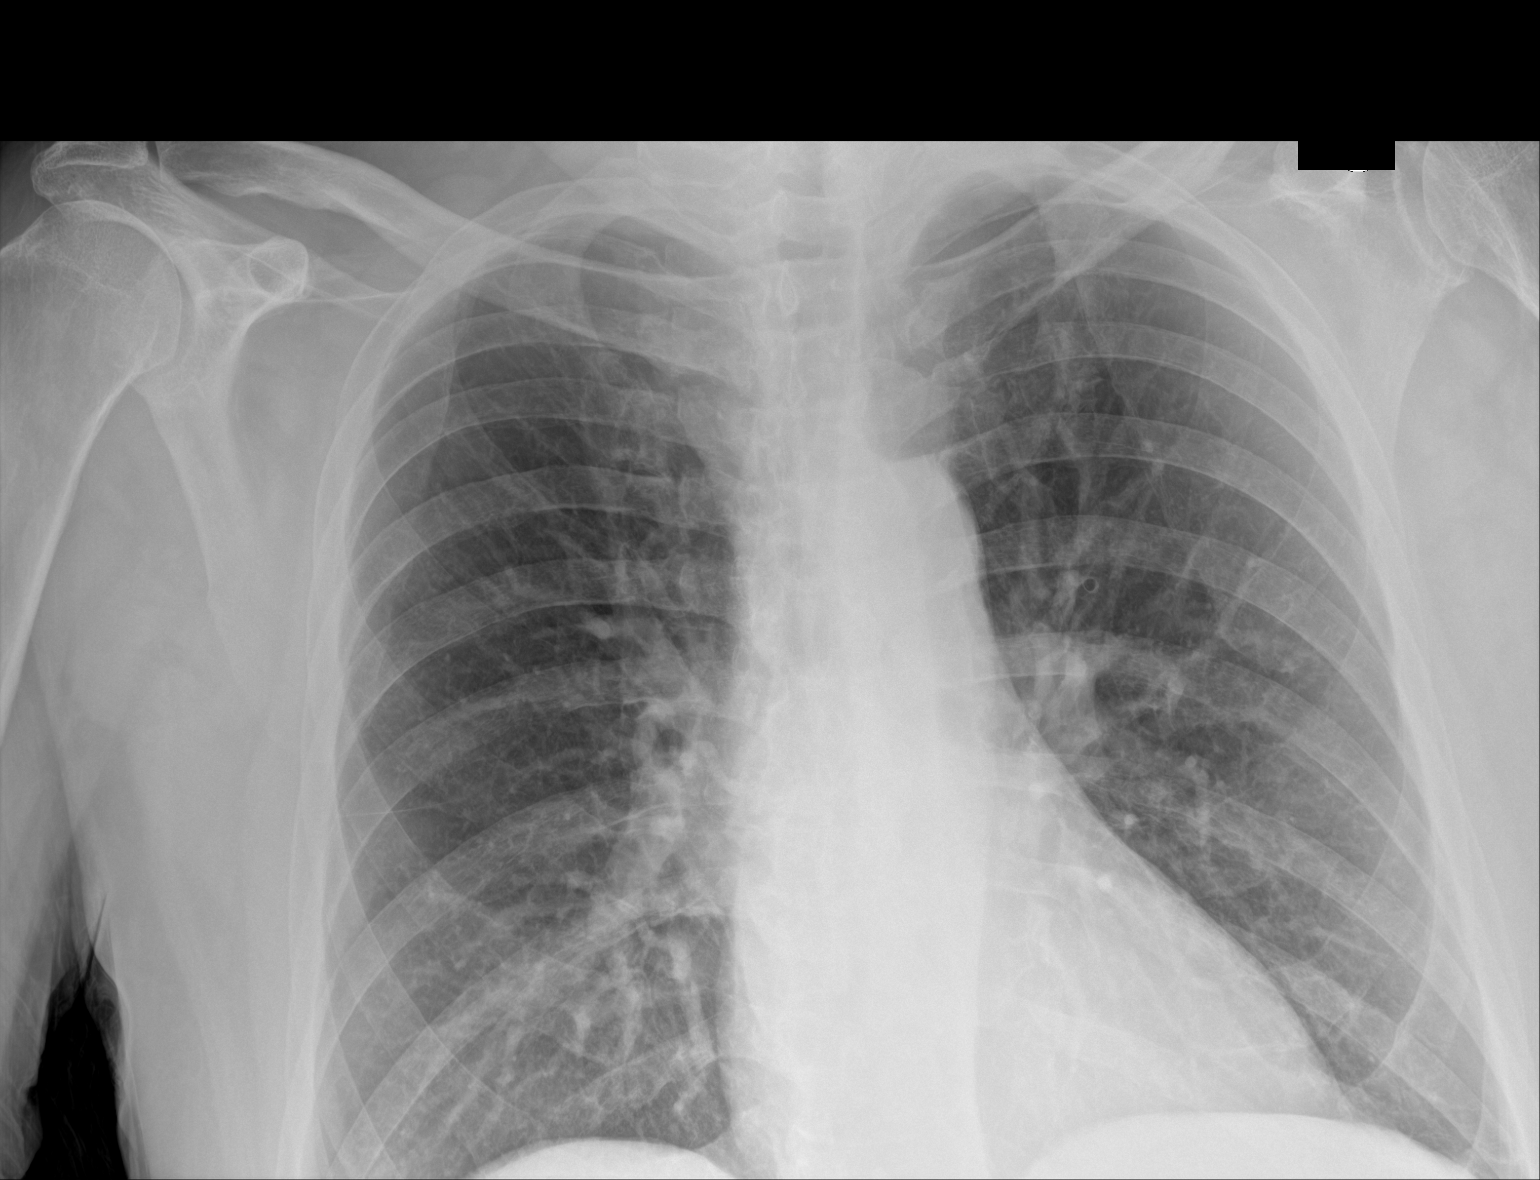

[2 of 2 positions shown; findings below may reference images not displayed]

FINDINGS: The heart size and mediastinal contours are within normal limits.
Both lungs are clear. The visualized skeletal structures are
unremarkable.
IMPRESSION: Negative.

## 2021-09-11 IMAGING — CT CT CERVICAL SPINE W/O CM
3 of 4 series · 13 of 33 positions shown, 16 images · non-contrast
Comparison: CT brain and cervical spine 10/23/2020

CLINICAL DATA: Tripped pain under the right eye

EXAM:
CT HEAD WITHOUT CONTRAST
CT CERVICAL SPINE WITHOUT CONTRAST
TECHNIQUE: Multidetector CT imaging of the head and cervical spine was
performed following the standard protocol without intravenous
contrast. Multiplanar CT image reconstructions of the cervical spine
were also generated.

[Series 4: sagittal bone · sagittal · 0.29mm/px · 5 of 77 slices shown, 6 images]
[im 26/77  bone]
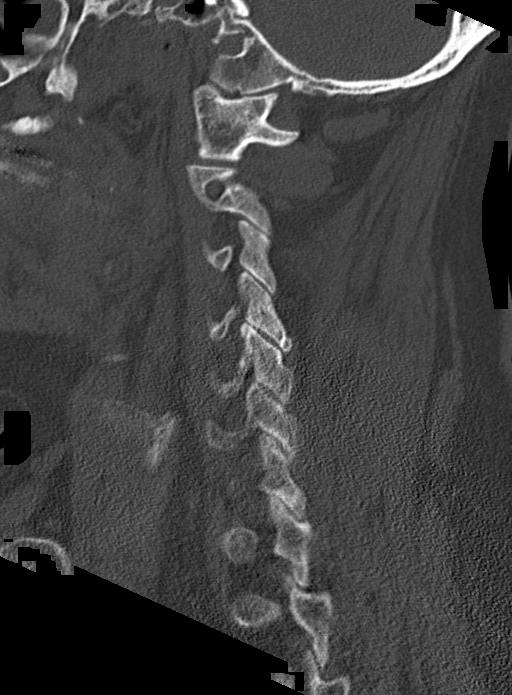
[im 32/77  bone]
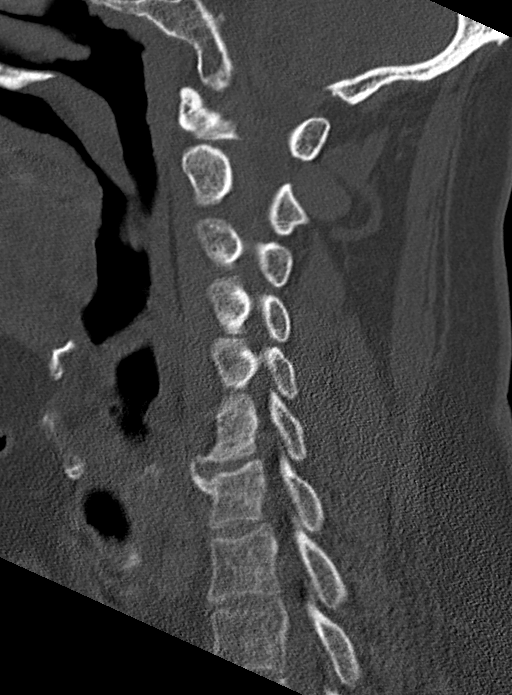
[im 39/77  soft-tissue]
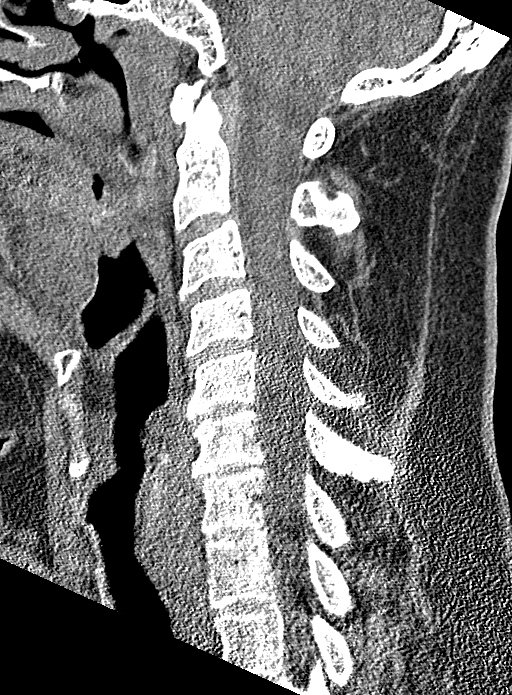
[im 39/77  bone]
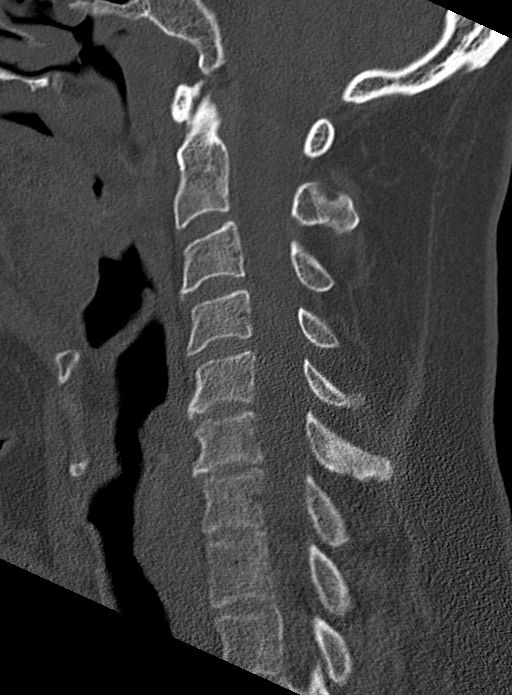
[im 45/77  bone]
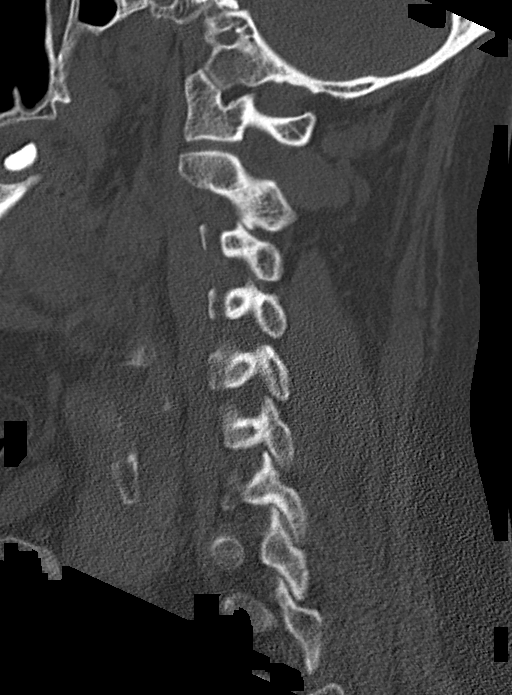
[im 51/77  bone]
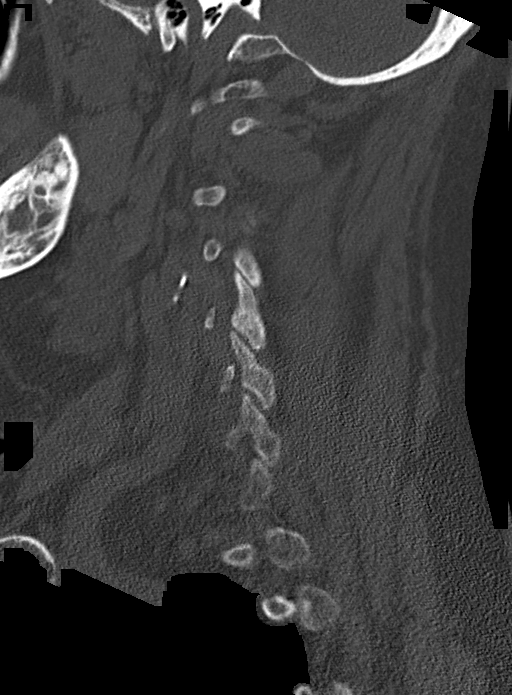

[Series 5: coronal bone · coronal · 0.28mm/px · 3 of 52 slices shown]
[im 11/52  bone]
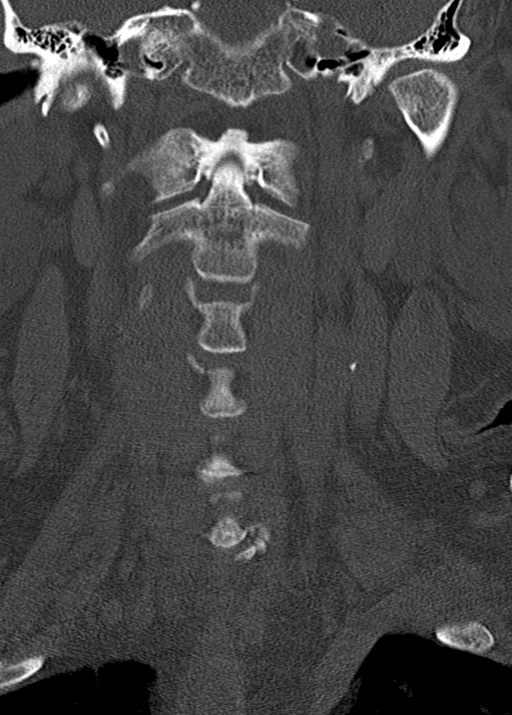
[im 21/52  bone]
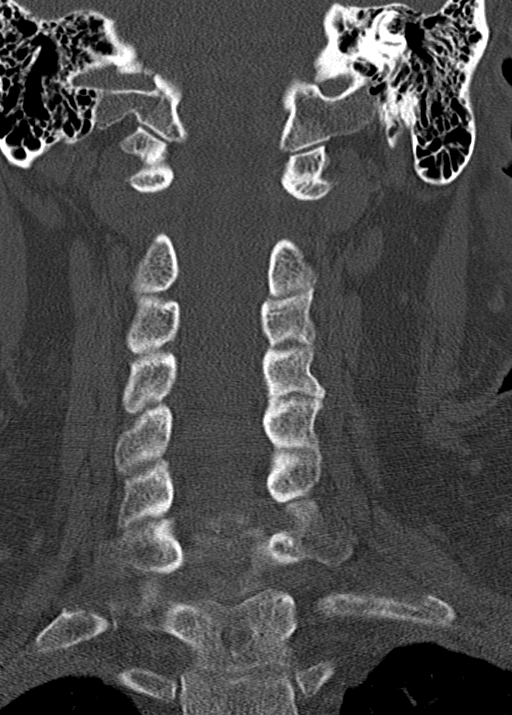
[im 31/52  bone]
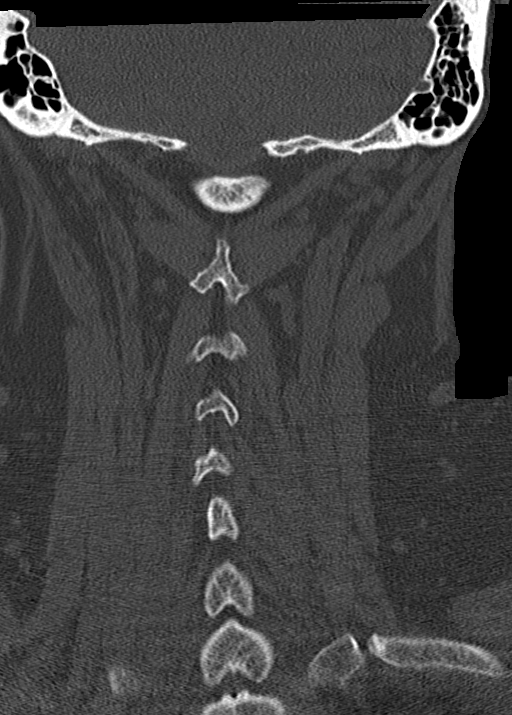

[Series 6: orthogonal bone · axial · 0.26mm/px · z∈[-345,-222]mm · 5 of 101 slices shown, 7 images]
[im 17/101  soft-tissue]
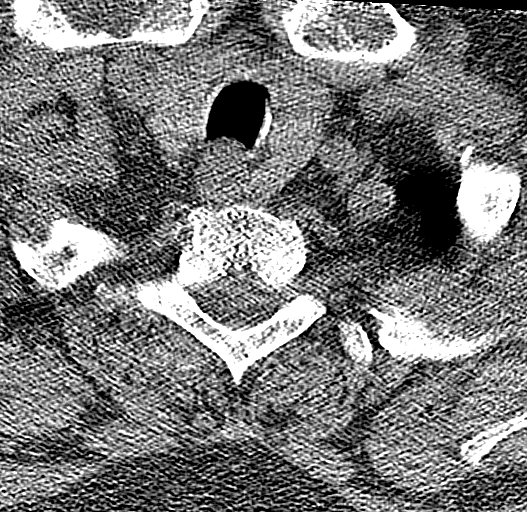
[im 17/101  bone]
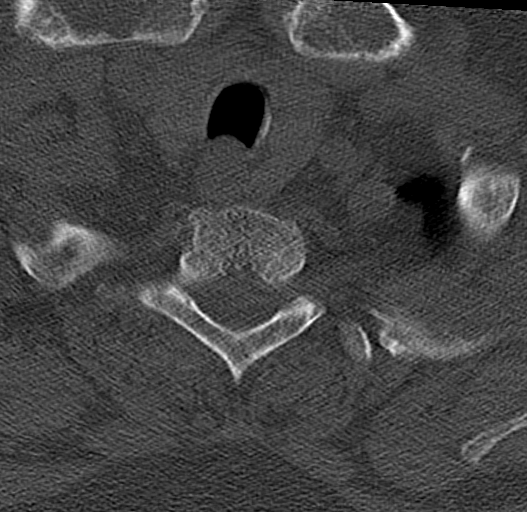
[im 34/101  bone]
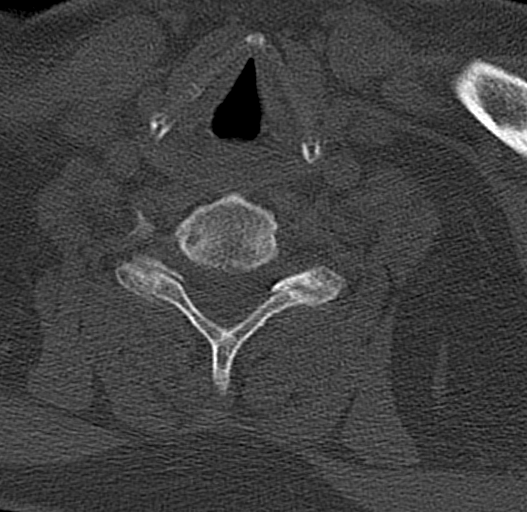
[im 51/101  bone]
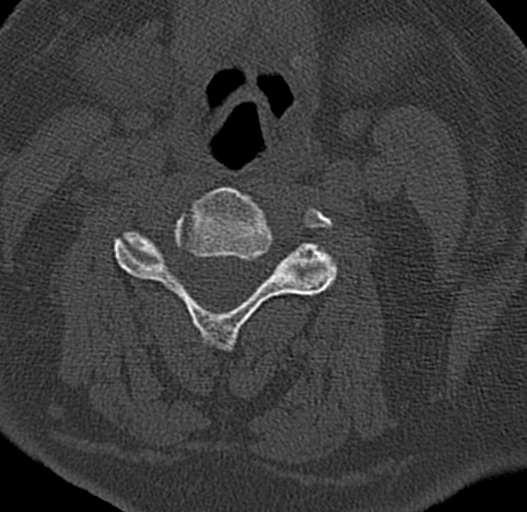
[im 67/101  bone]
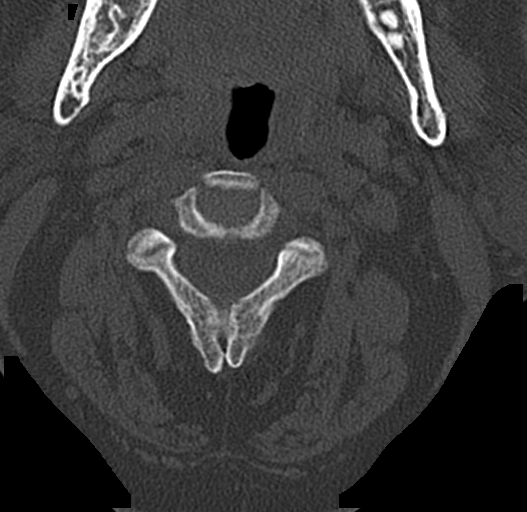
[im 84/101  soft-tissue]
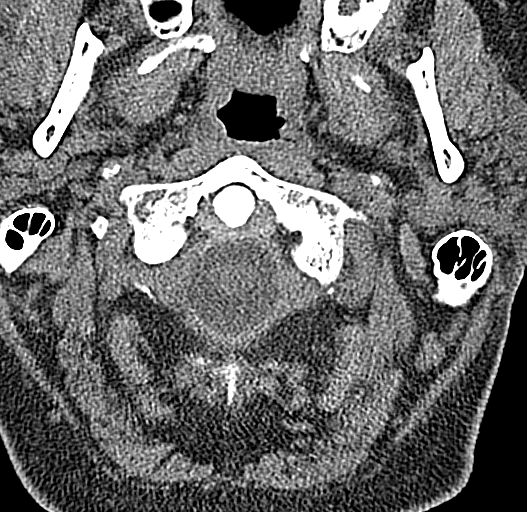
[im 84/101  bone]
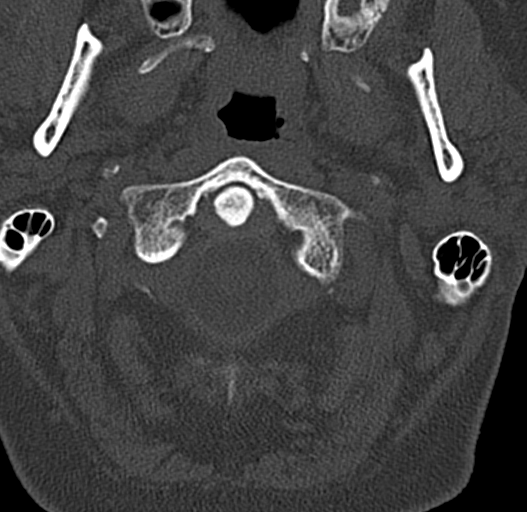

[13 of 33 positions shown; findings below may reference images not displayed]

FINDINGS: CT HEAD FINDINGS

Brain: No acute territorial infarction, hemorrhage, or intracranial
mass. Mild atrophy. Stable ventricle size. Minimal chronic small
vessel ischemic change of the white matter

Vascular: No hyperdense vessels.  No unexpected calcification

Skull: Normal. Negative for fracture or focal lesion.

Sinuses/Orbits: No acute finding.

Other: Mild left forehead soft tissue swelling

CT CERVICAL SPINE FINDINGS

Alignment: Straightening of the cervical spine. No subluxation.
Facet alignment within normal limits

Skull base and vertebrae: No acute fracture. No primary bone lesion
or focal pathologic process.

Soft tissues and spinal canal: No prevertebral fluid or swelling. No
visible canal hematoma.

Disc levels: Multiple level degenerative change. Mild disc space
narrowing C5-C6 and C6-C7.

Upper chest: Negative.

Other: None
IMPRESSION: 1. No CT evidence for acute intracranial abnormality.
2. Straightening of the cervical spine with degenerative changes. No
acute osseous abnormality.

## 2021-09-18 ENCOUNTER — Other Ambulatory Visit: Payer: Self-pay

## 2021-09-18 IMAGING — CT CT CERVICAL SPINE W/O CM
3 of 4 series · 9 of 33 positions shown, 10 images · non-contrast
Comparison: 10/23/2020

CLINICAL DATA: Fall, head injury, intoxication

EXAM:
CT HEAD WITHOUT CONTRAST
CT CERVICAL SPINE WITHOUT CONTRAST
TECHNIQUE: Multidetector CT imaging of the head and cervical spine was
performed following the standard protocol without intravenous
contrast. Multiplanar CT image reconstructions of the cervical spine
were also generated.

[Series 6: sagittal bone · sagittal · 0.22mm/px · 5 of 54 slices shown]
[im 18/54  bone]
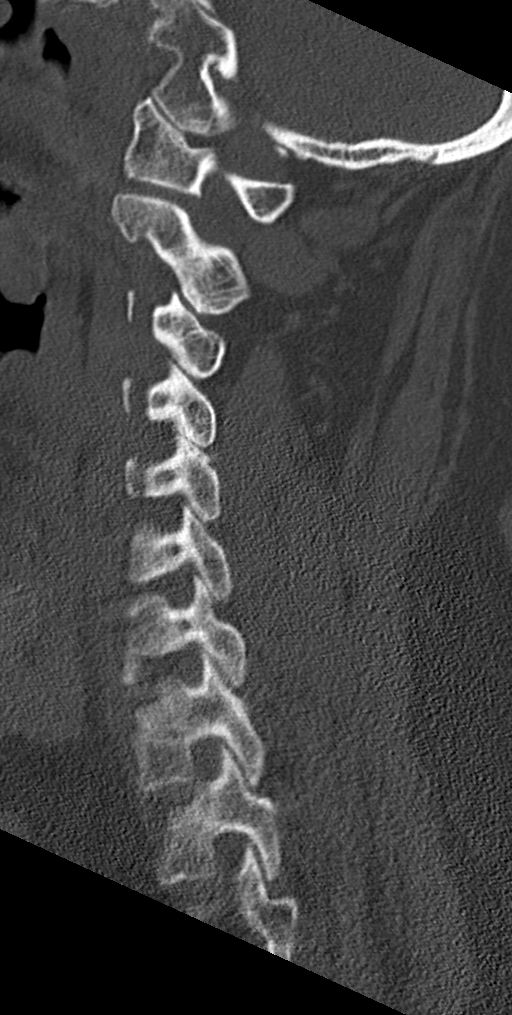
[im 23/54  bone]
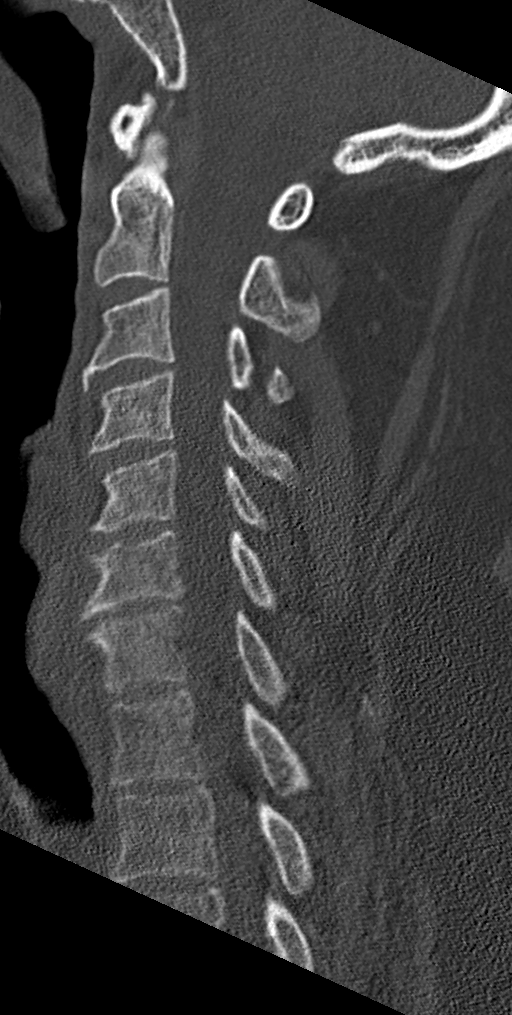
[im 27/54  bone]
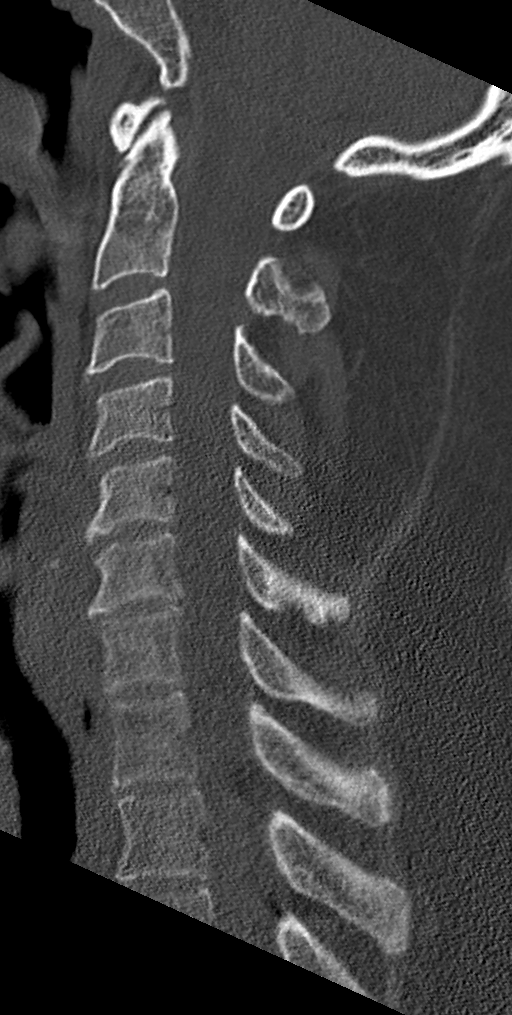
[im 31/54  bone]
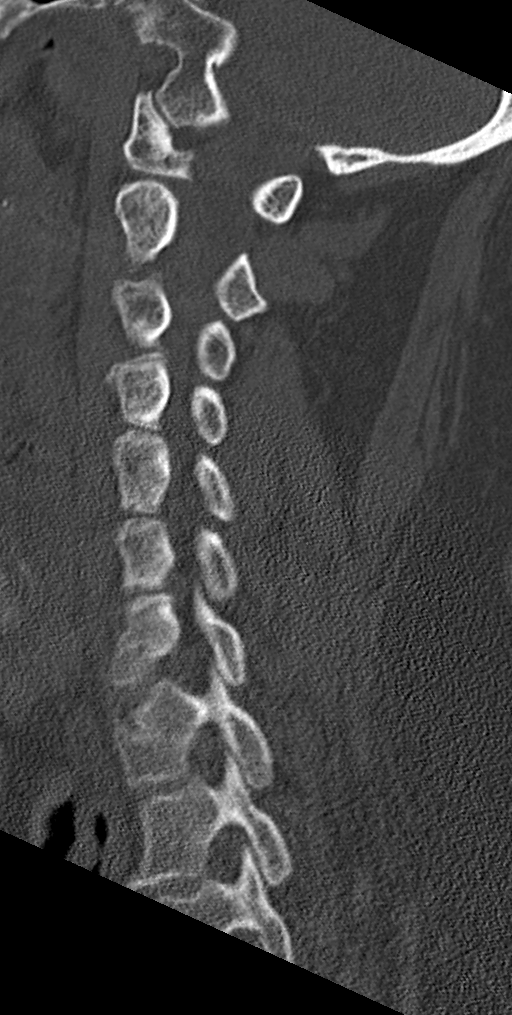
[im 36/54  bone]
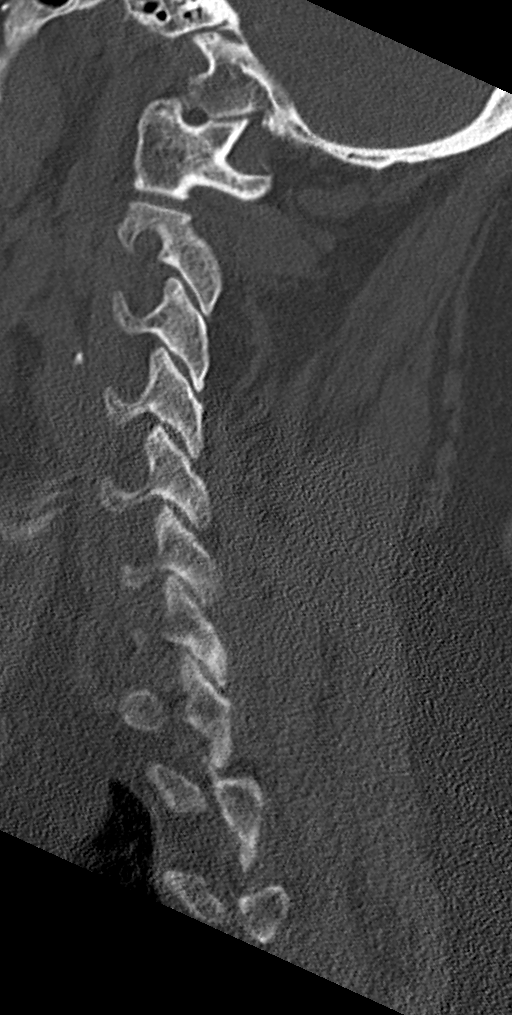

[Series 7: coronal bone · coronal · 0.21mm/px · 3 of 51 slices shown]
[im 11/51  bone]
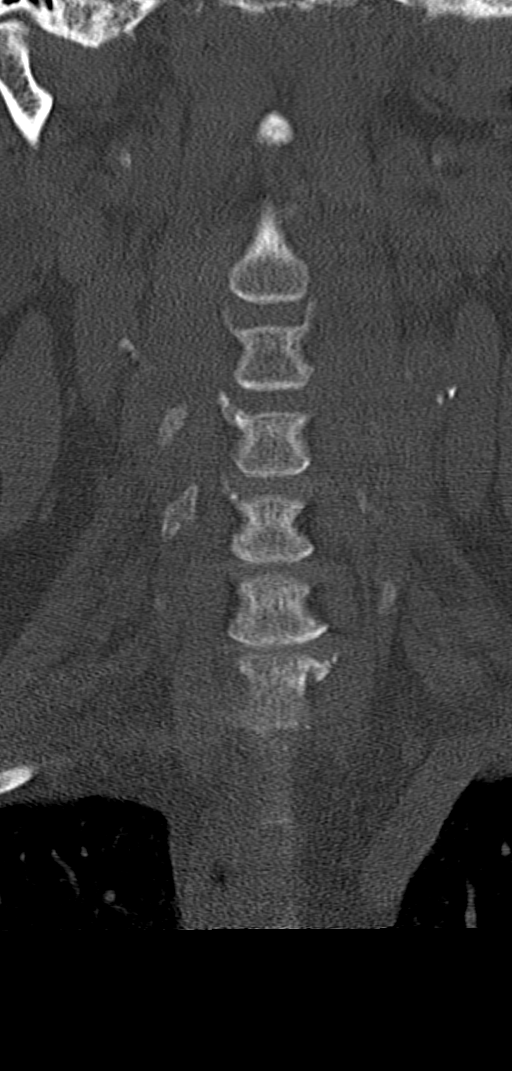
[im 21/51  bone]
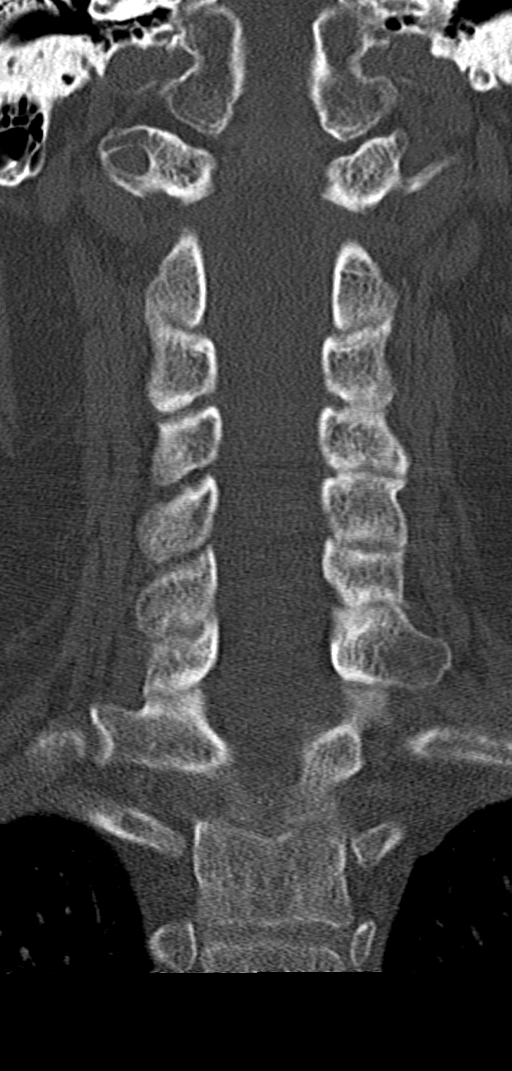
[im 31/51  bone]
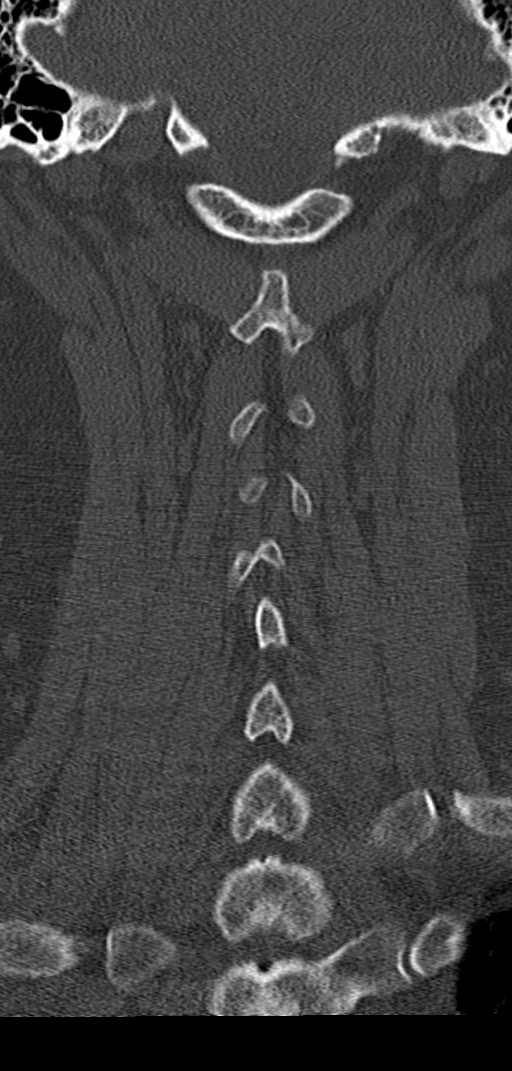

[Series 8: orthogonal bone · axial · 0.21mm/px · z∈[-216,-216]mm · 1 of 114 slices shown, 2 images]
[im 57/114  soft-tissue]
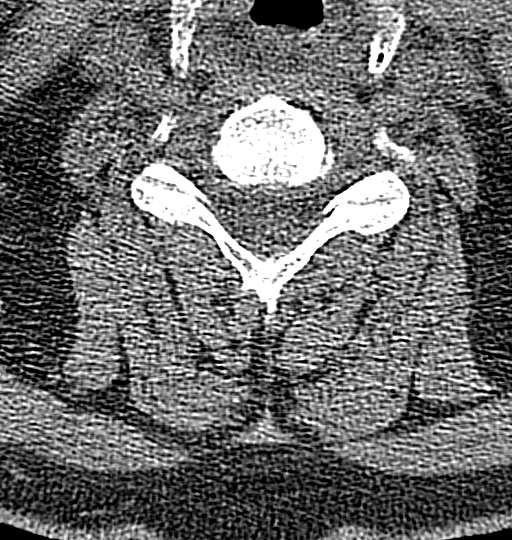
[im 57/114  bone]
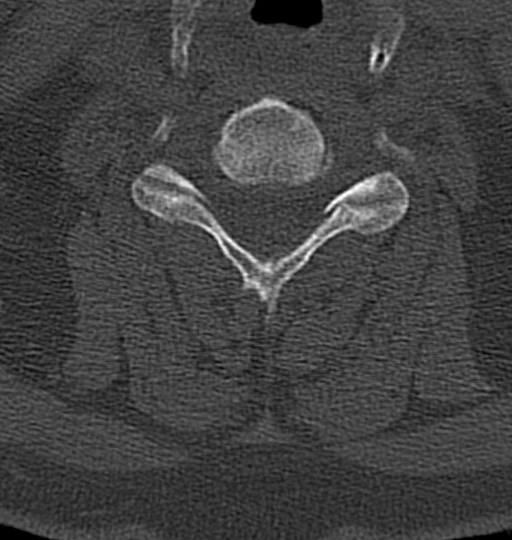

[9 of 33 positions shown; findings below may reference images not displayed]

FINDINGS: CT HEAD FINDINGS

Brain: Normal anatomic configuration. Mild parenchymal volume loss
is commensurate with the patient's age. No abnormal intra or
extra-axial mass lesion or fluid collection. No abnormal mass effect
or midline shift. No evidence of acute intracranial hemorrhage or
infarct. Ventricular size is normal. Cerebellum unremarkable.

Vascular: Unremarkable

Skull: Intact

Sinuses/Orbits: Moderate mucosal thickening within the visualized
left maxillary sinus, progressive since prior exam. Remaining
paranasal sinuses are clear. Orbits are unremarkable.

Other: Mastoid air cells and middle ear cavities are clear. Multiple
areas of soft tissue swelling involving the right parietal scalp at
the vertex, right parieto-occipital scalp, and suboccipital soft
tissues of the right cervical region.

CT CERVICAL SPINE FINDINGS

Alignment: Mild straightening, unchanged.  No listhesis.

Skull base and vertebrae: The craniocervical junction is
unremarkable. The atlantodental interval is normal. No acute
fracture of the cervical spine. No lytic or blastic bone lesion.

Soft tissues and spinal canal: No prevertebral fluid or swelling. No
visible canal hematoma.

Disc levels: There is intervertebral disc space narrowing and
endplate remodeling at C6-7 and, to a lesser extent, C5-6 in keeping
with changes of mild to moderate degenerative disc disease. Minimal
endplate changes are noted at C3-4 in keeping with minimal
degenerative disc disease at this level. Remaining intervertebral
disc heights are preserved. Vertebral body heights are preserved.

Review of the axial images demonstrates mild uncovertebral arthrosis
at C6-7 and resulting in mild bilateral neuroforaminal narrowing.
Remaining neural foramina are widely patent. No significant canal
stenosis.

Upper chest: Unremarkable

Other: None significant
IMPRESSION: No acute intracranial injury.  No calvarial fracture.

Progressive left maxillary sinus disease.

Multiple areas of soft tissue swelling involving the posterior right
scalp and suboccipital cervical soft tissues.

No acute fracture or listhesis of the cervical spine.

## 2021-09-18 IMAGING — CR DG CHEST 2V
1 series · 2 of 2 positions shown · non-contrast
Comparison: 12/11/2020.

CLINICAL DATA: Fall.

EXAM:
CHEST - 2 VIEW

[Series 1: dg chest 2 view · 0.14mm/px · 2 of 2 slices shown]
[im 1/2]
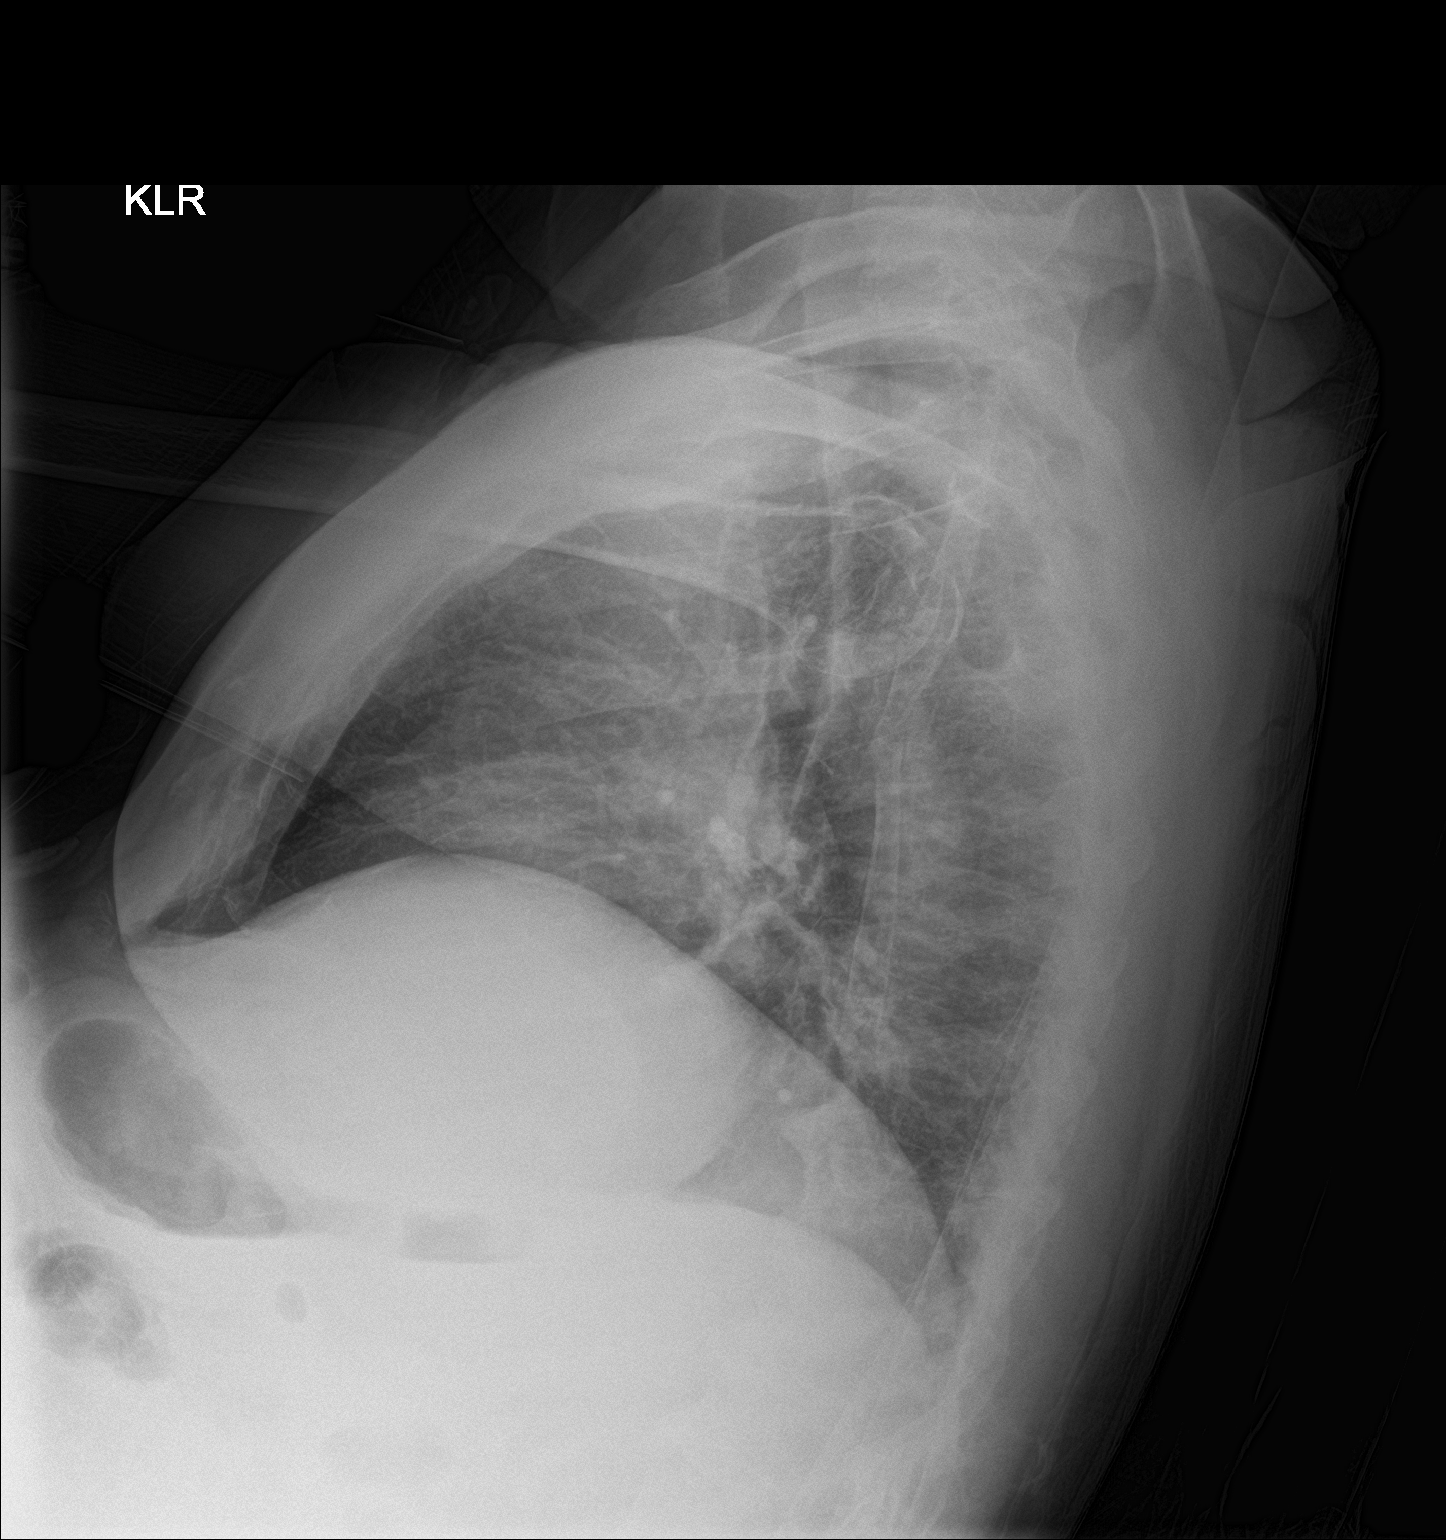
[im 2/2]
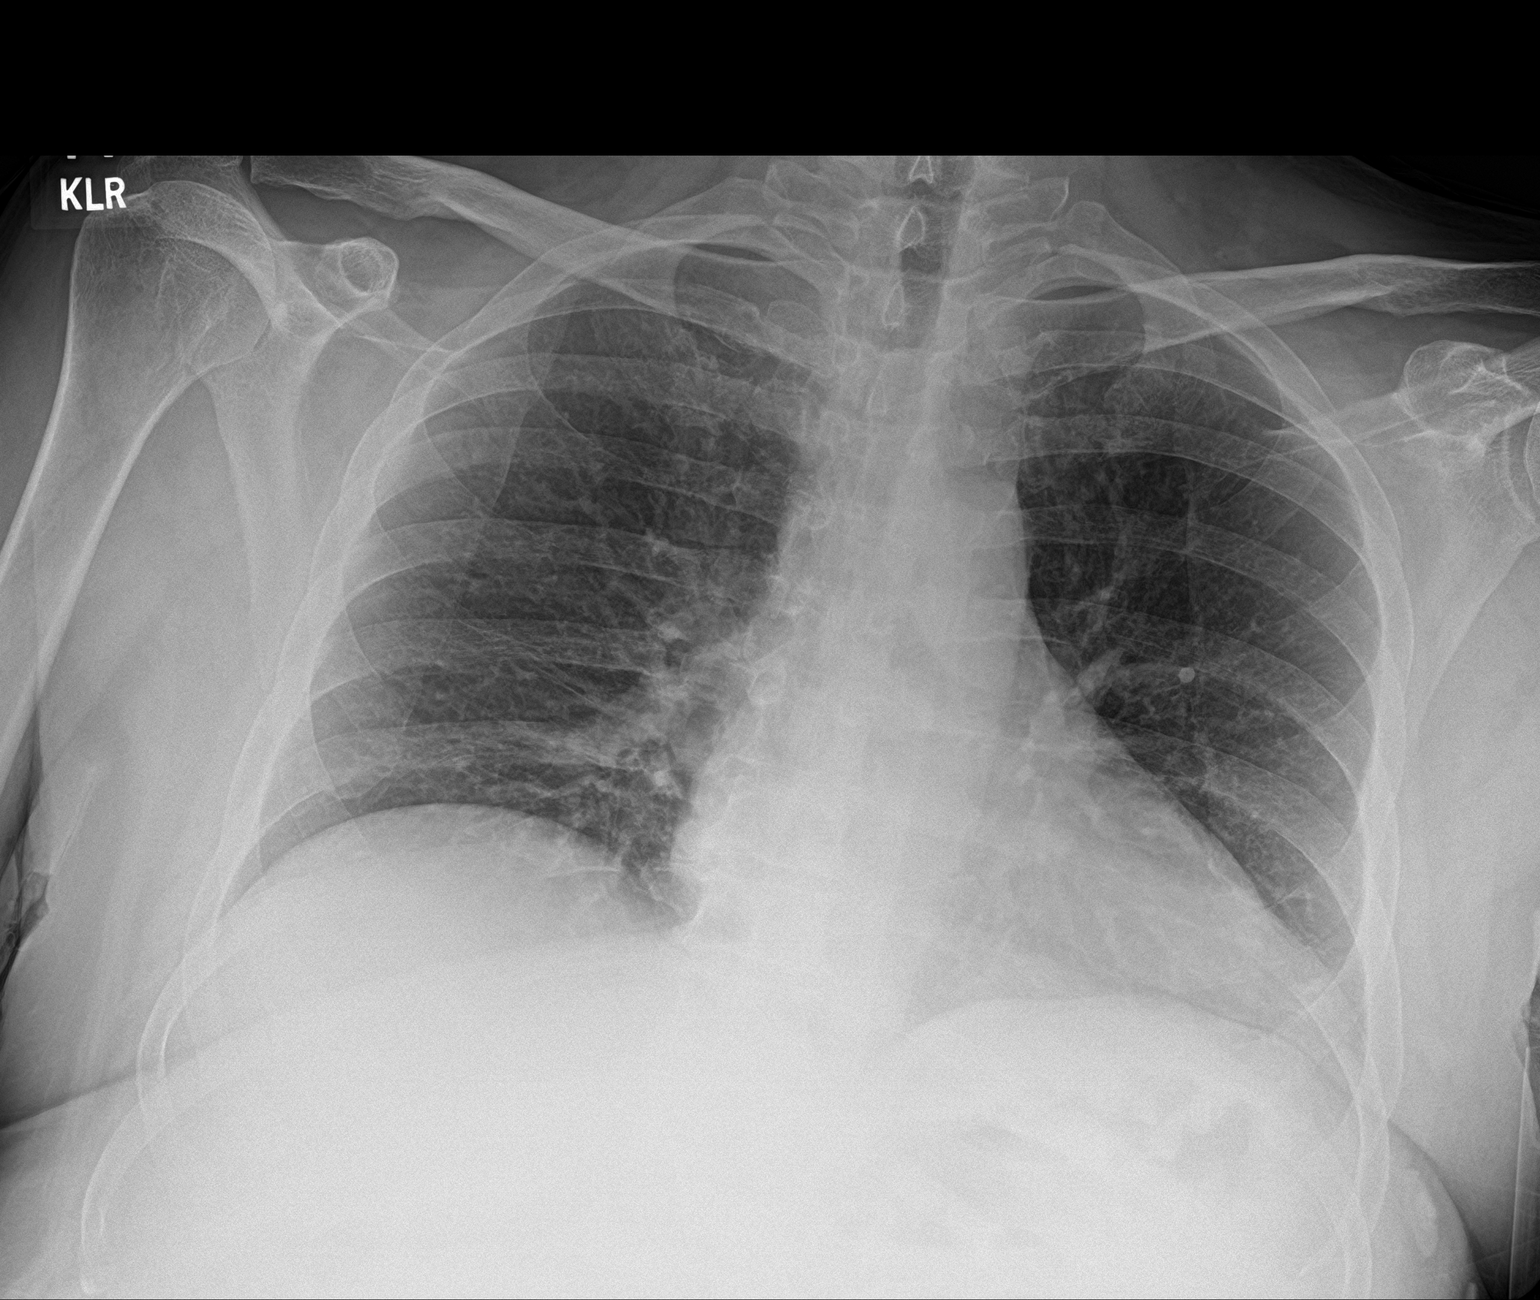

[2 of 2 positions shown; findings below may reference images not displayed]

FINDINGS: Mediastinum hilar structures normal. Heart size normal. Low lung
volumes with bibasilar atelectasis. No pleural effusion or
pneumothorax. Calcific density noted over the left lower chest wall,
most likely dystrophic. Diffuse osteopenia. Scoliosis thoracic
spine. Diffuse severe degenerative change thoracic spine.
IMPRESSION: Low lung volumes with bibasilar atelectasis.

## 2021-09-20 IMAGING — CR DG CHEST 2V
1 series · 2 of 2 positions shown · non-contrast
Comparison: 12/18/2020

CLINICAL DATA: Fell, left chest wall bruising, hypertension

EXAM:
CHEST - 2 VIEW

[Series 1: dg chest 2 view · 0.14mm/px · 2 of 2 slices shown]
[im 1/2]
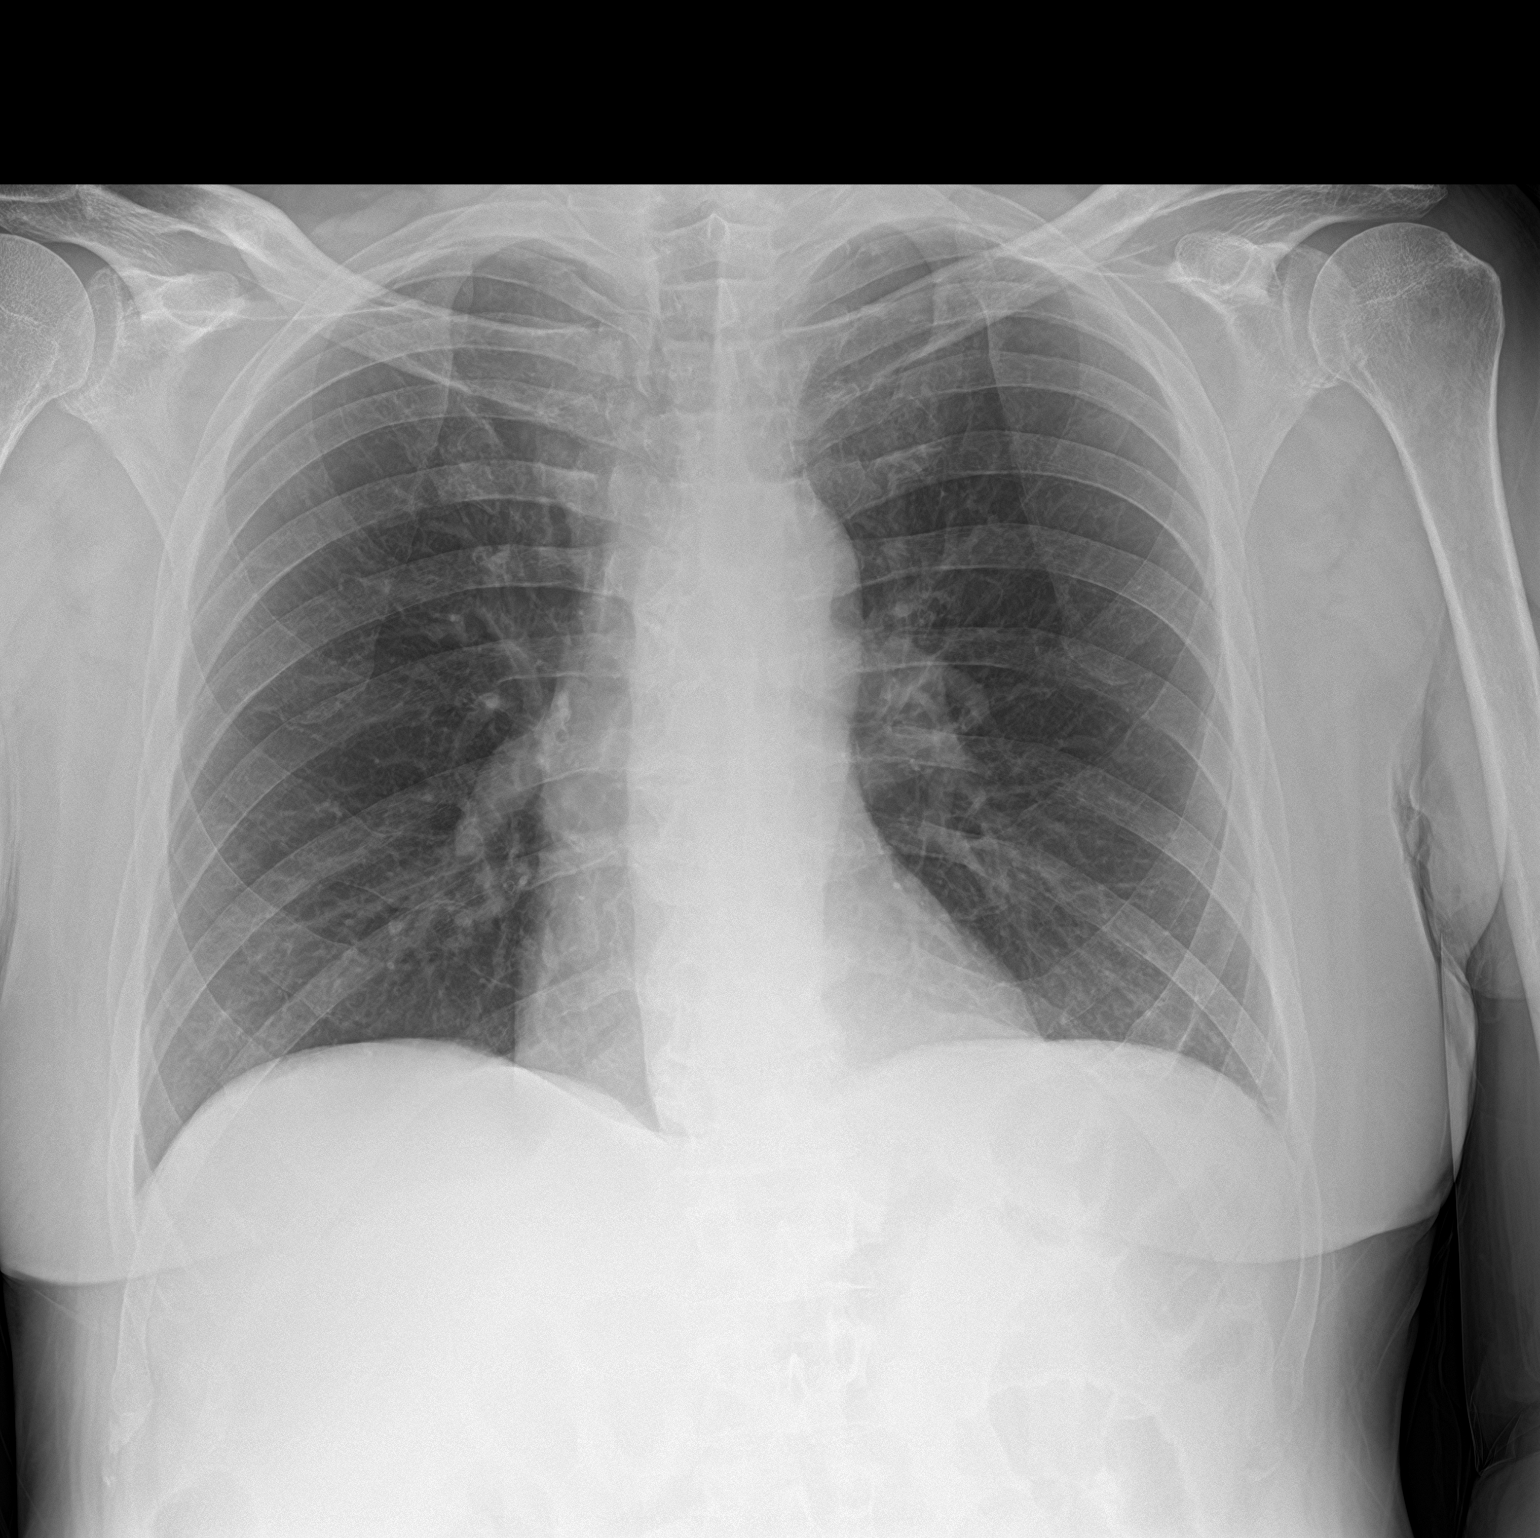
[im 2/2]
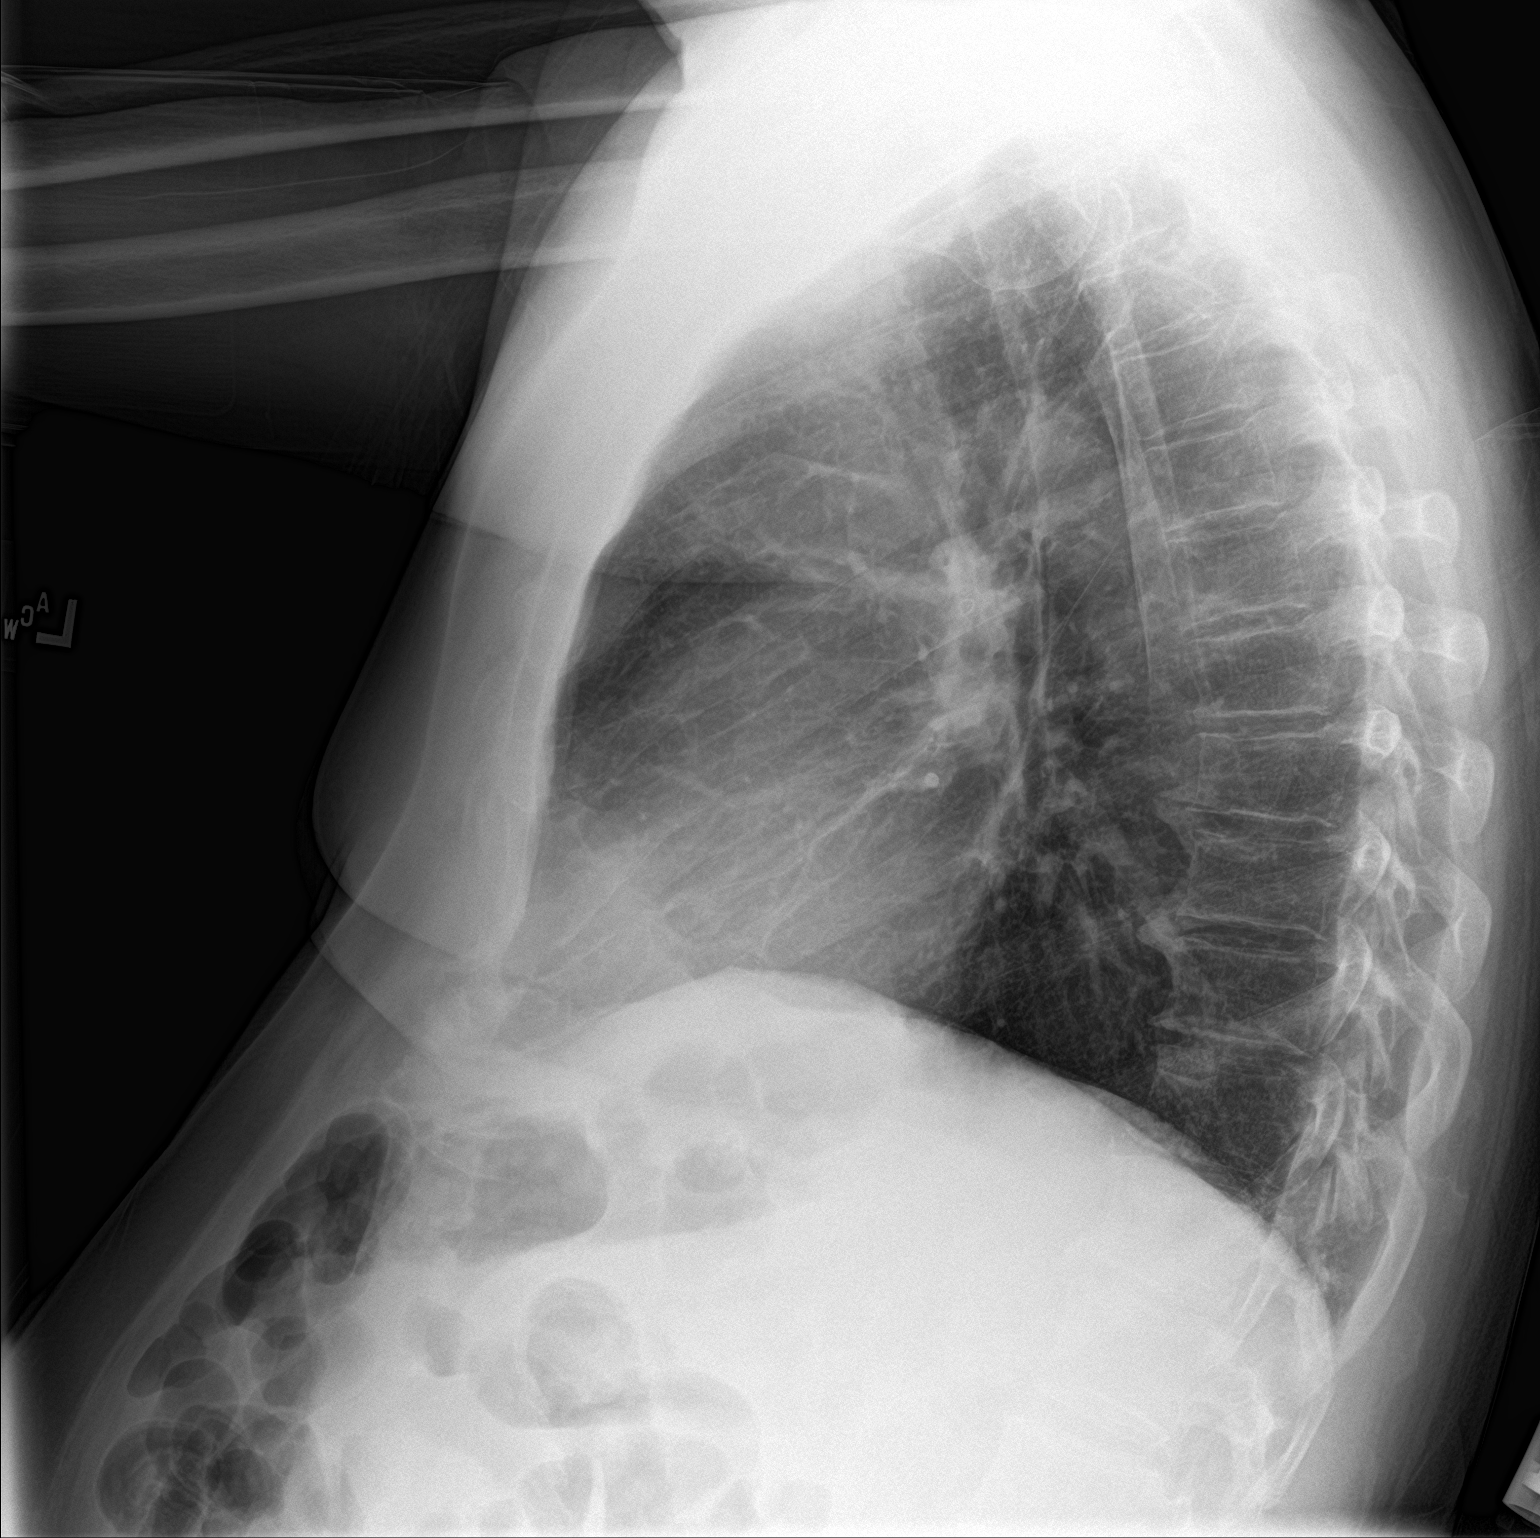

[2 of 2 positions shown; findings below may reference images not displayed]

FINDINGS: Frontal and lateral views of the chest demonstrate an unremarkable
cardiac silhouette. No airspace disease, effusion, or pneumothorax.
No acute displaced fracture.
IMPRESSION: 1. No acute intrathoracic process.

## 2021-09-24 ENCOUNTER — Ambulatory Visit: Payer: Medicaid Other | Admitting: Gerontology

## 2021-09-30 ENCOUNTER — Telehealth: Payer: Self-pay | Admitting: Pharmacist

## 2021-09-30 NOTE — Telephone Encounter (Signed)
--   Elmer Picker - Tuesday, September 30, 2021 2:14 PM --Faxed script to [facility] for refill on [facility]

## 2021-10-01 IMAGING — CR DG SINUSES COMPLETE 3+V
1 series · 4 of 4 positions shown · non-contrast
Comparison: None.

CLINICAL DATA: Intermittent sinus congestion for 1 month,
rhinorrhea

EXAM:
PARANASAL SINUSES - COMPLETE 3 + VIEW

[Series 1: dg sinuses complete · 0.14mm/px · 4 of 4 slices shown]
[im 1/4]
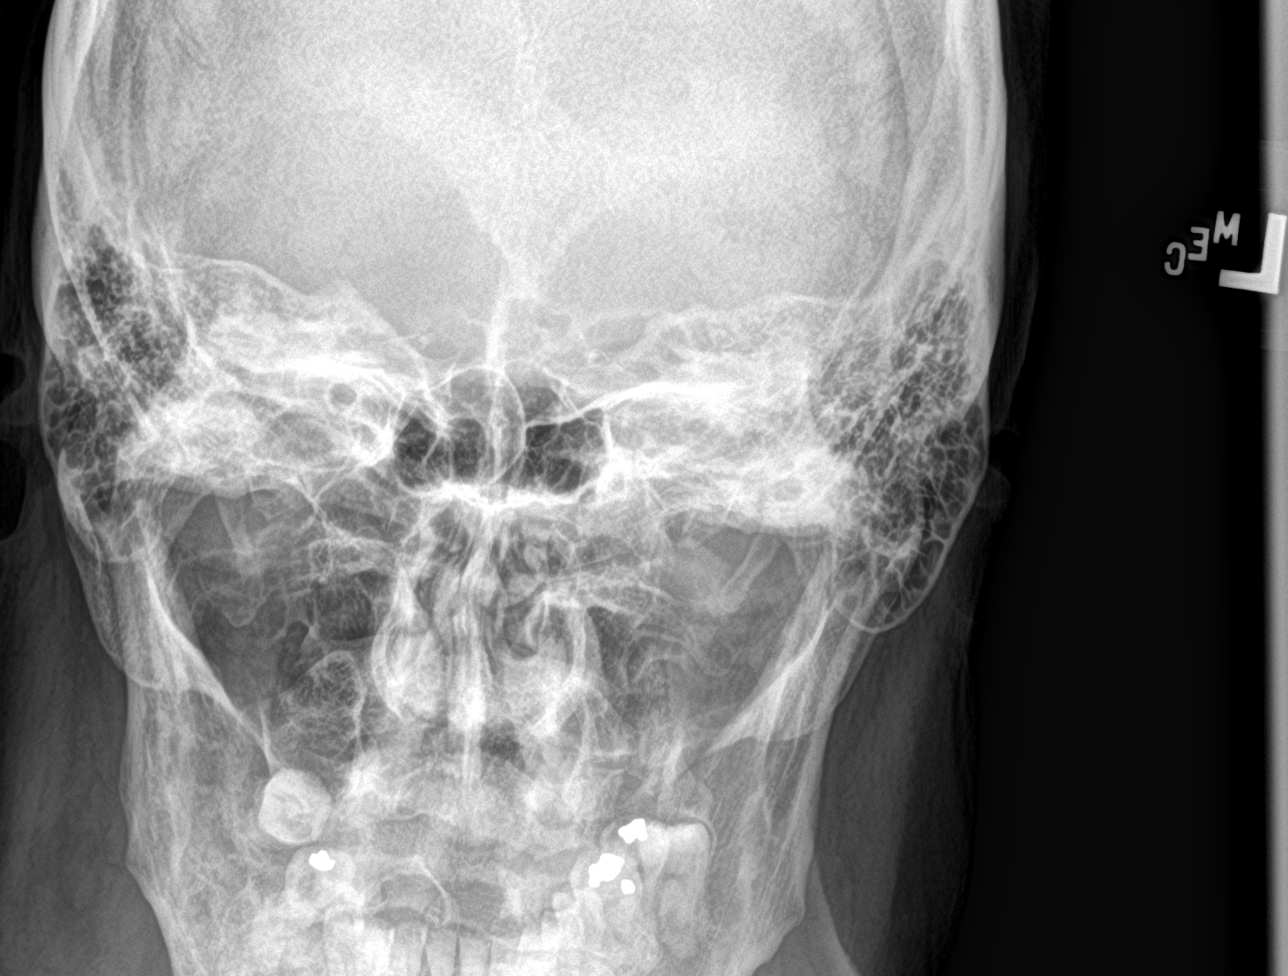
[im 2/4]
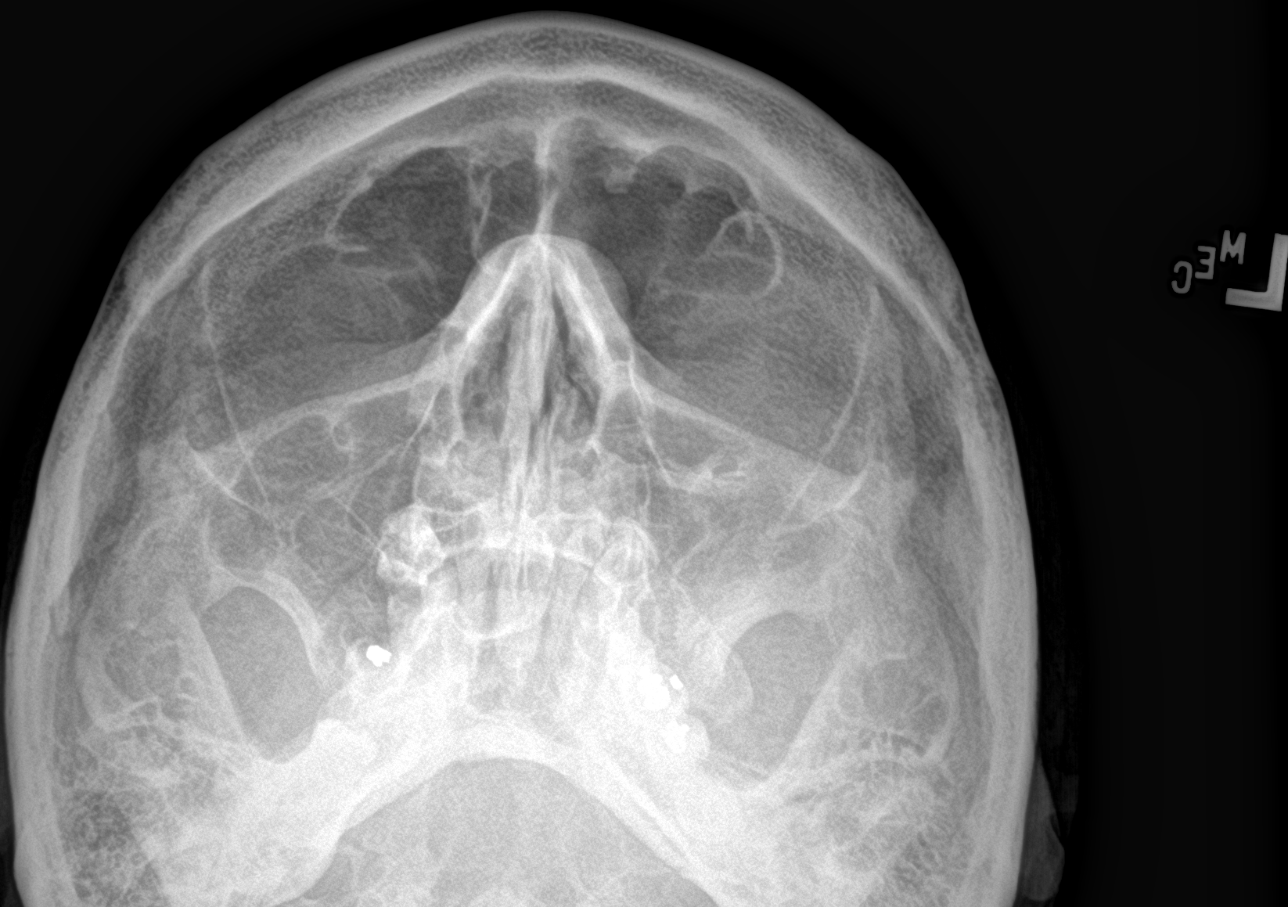
[im 3/4]
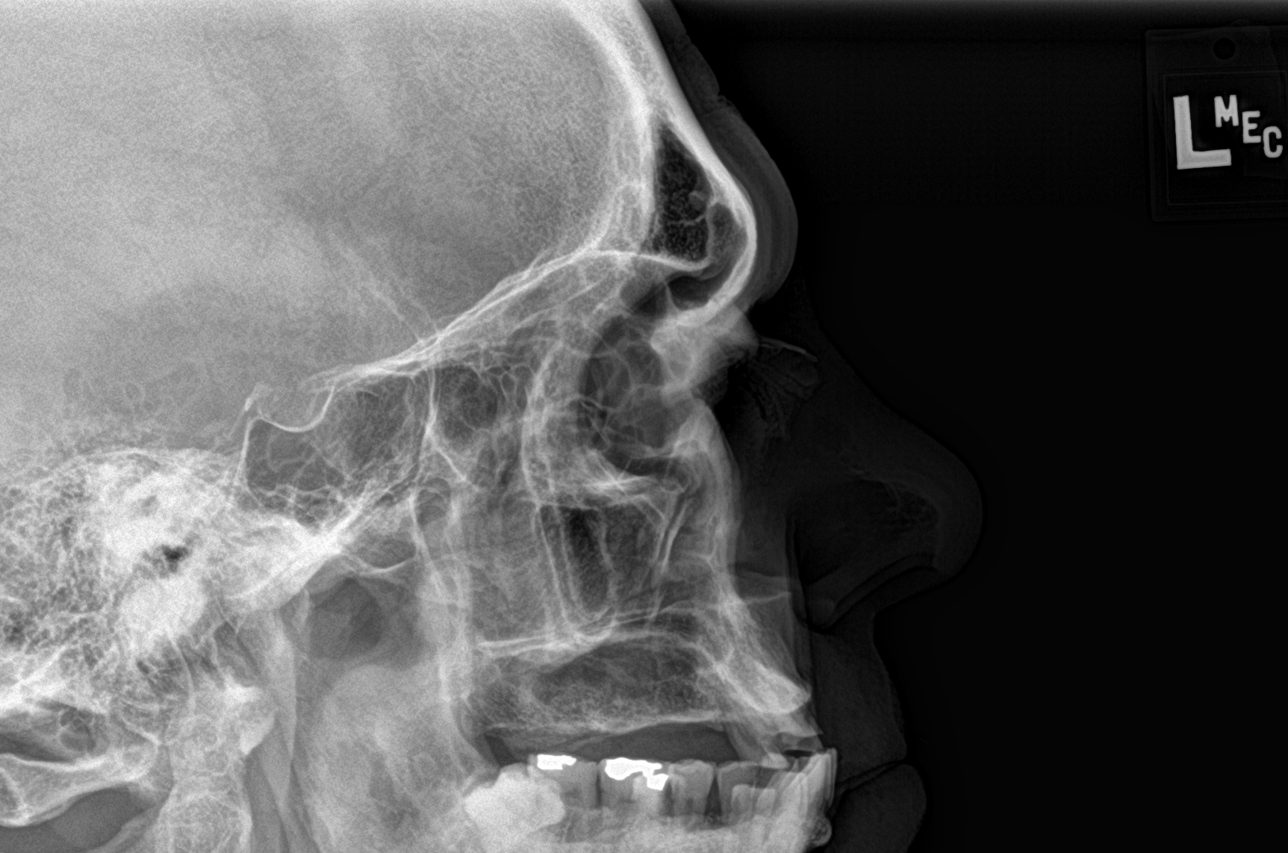
[im 4/4]
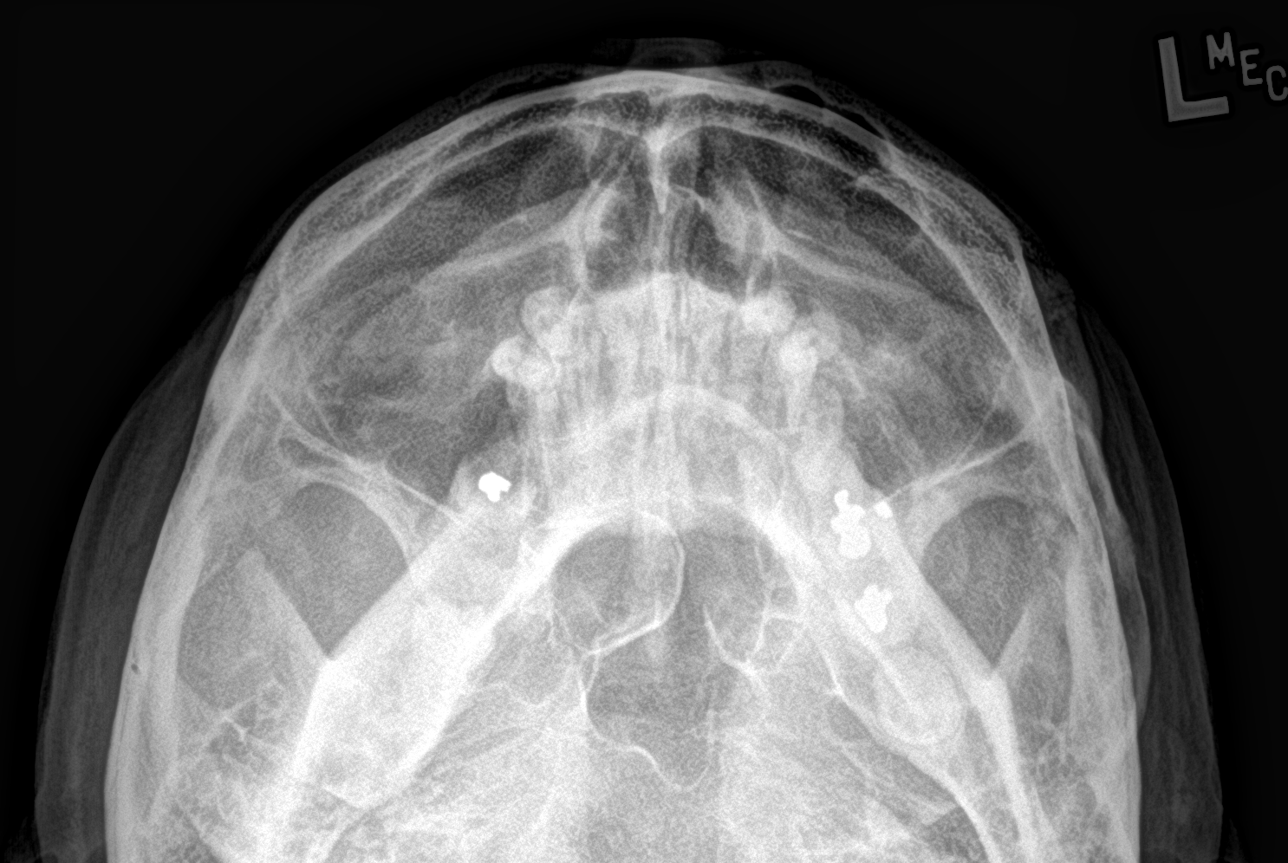

[4 of 4 positions shown; findings below may reference images not displayed]

FINDINGS: Frontal, [place], submental vertex, and lateral views of the facial
bones and paranasal sinuses are obtained. Paranasal sinuses are
clear. No gas fluid levels. Nasal septum is midline. No acute bony
abnormalities.
IMPRESSION: 1. Unremarkable facial bones and paranasal sinuses.

## 2021-10-11 ENCOUNTER — Emergency Department: Payer: Self-pay

## 2021-10-11 ENCOUNTER — Emergency Department
Admission: EM | Admit: 2021-10-11 | Discharge: 2021-10-12 | Disposition: A | Discharge status: Home / Self Care | Payer: Self-pay | Attending: Emergency Medicine | Admitting: Emergency Medicine

## 2021-10-11 ENCOUNTER — Encounter: Payer: Self-pay | Admitting: Intensive Care

## 2021-10-11 ENCOUNTER — Emergency Department
Admission: EM | Admit: 2021-10-11 | Discharge: 2021-10-11 | Disposition: A | Discharge status: Home / Self Care | Payer: Medicaid Other | Attending: Emergency Medicine | Admitting: Emergency Medicine

## 2021-10-11 ENCOUNTER — Other Ambulatory Visit: Payer: Self-pay

## 2021-10-11 DIAGNOSIS — Z87891 Personal history of nicotine dependence: Secondary | ICD-10-CM | POA: Insufficient documentation

## 2021-10-11 DIAGNOSIS — I10 Essential (primary) hypertension: Secondary | ICD-10-CM | POA: Insufficient documentation

## 2021-10-11 DIAGNOSIS — K0889 Other specified disorders of teeth and supporting structures: Secondary | ICD-10-CM | POA: Insufficient documentation

## 2021-10-11 DIAGNOSIS — F10129 Alcohol abuse with intoxication, unspecified: Secondary | ICD-10-CM | POA: Insufficient documentation

## 2021-10-11 DIAGNOSIS — R109 Unspecified abdominal pain: Secondary | ICD-10-CM | POA: Insufficient documentation

## 2021-10-11 DIAGNOSIS — Y908 Blood alcohol level of 240 mg/100 ml or more: Secondary | ICD-10-CM | POA: Insufficient documentation

## 2021-10-11 DIAGNOSIS — F10920 Alcohol use, unspecified with intoxication, uncomplicated: Secondary | ICD-10-CM

## 2021-10-11 DIAGNOSIS — J45909 Unspecified asthma, uncomplicated: Secondary | ICD-10-CM | POA: Insufficient documentation

## 2021-10-11 DIAGNOSIS — Z79899 Other long term (current) drug therapy: Secondary | ICD-10-CM | POA: Insufficient documentation

## 2021-10-11 DIAGNOSIS — J3489 Other specified disorders of nose and nasal sinuses: Secondary | ICD-10-CM | POA: Insufficient documentation

## 2021-10-11 LAB — SALICYLATE LEVEL: Salicylate Lvl: <7 mg/dL — ABNORMAL LOW (ref 7.0–30.0)

## 2021-10-11 LAB — COMPREHENSIVE METABOLIC PANEL
ALT: 14 U/L (ref 0–44)
AST: 13 U/L — ABNORMAL LOW (ref 15–41)
Albumin: 3.9 g/dL (ref 3.5–5.0)
Alkaline Phosphatase: 70 U/L (ref 38–126)
Anion gap: 9 (ref 5–15)
BUN: 20 mg/dL (ref 8–23)
CO2: 23 mmol/L (ref 22–32)
Calcium: 8.7 mg/dL — ABNORMAL LOW (ref 8.9–10.3)
Chloride: 110 mmol/L (ref 98–111)
Creatinine, Ser: 1.04 mg/dL (ref 0.61–1.24)
GFR, Estimated: >60 mL/min (ref >60)
Glucose, Bld: 117 mg/dL — ABNORMAL HIGH (ref 70–99)
Potassium: 4 mmol/L (ref 3.5–5.1)
Sodium: 142 mmol/L (ref 135–145)
Total Bilirubin: 0.7 mg/dL (ref 0.3–1.2)
Total Protein: 7.2 g/dL (ref 6.5–8.1)

## 2021-10-11 LAB — CBC
HCT: 43.6 % (ref 39.0–52.0)
Hemoglobin: 13.8 g/dL (ref 13.0–17.0)
MCH: 27.5 pg (ref 26.0–34.0)
MCHC: 31.7 g/dL (ref 30.0–36.0)
MCV: 86.9 fL (ref 80.0–100.0)
Platelets: 329 10*3/uL (ref 150–400)
RBC: 5.02 MIL/uL (ref 4.22–5.81)
RDW: 15.8 % — ABNORMAL HIGH (ref 11.5–15.5)
WBC: 7.7 10*3/uL (ref 4.0–10.5)
nRBC: 0 % (ref 0.0–0.2)

## 2021-10-11 LAB — ACETAMINOPHEN LEVEL: Acetaminophen (Tylenol), Serum: <10 ug/mL — ABNORMAL LOW (ref 10–30)

## 2021-10-11 LAB — ETHANOL: Alcohol, Ethyl (B): 373 mg/dL (ref <10)

## 2021-10-11 MED ORDER — KETOROLAC TROMETHAMINE 30 MG/ML IJ SOLN
30.0000 mg | Freq: Once | INTRAMUSCULAR | Status: AC
  Administered 2021-10-11: 30 mg via INTRAMUSCULAR
  Filled 2021-10-11: qty 1

## 2021-10-11 MED ORDER — LORAZEPAM 1 MG PO TABS
1.0000 mg | ORAL_TABLET | Freq: Once | ORAL | Status: AC
  Administered 2021-10-11: 1 mg via ORAL
  Filled 2021-10-11: qty 1

## 2021-10-11 NOTE — ED Provider Notes (Signed)
[facility] Emergency Department Provider Note ____________________________________________   Event Date/Time   First MD Initiated Contact with Patient 10/11/21 1359     (approximate)  I have reviewed the triage vital signs and the nursing notes.  HISTORY  Chief Complaint Dental Pain   HPI Miguel [name] is a 60 y.o. malewho presents to the ED for evaluation of dental pain.   Chart review indicates hx alcohol abuse. Pt is well known to me. Actually hasn't been here in about a month.   Today is his birthday and his wife (with MS) is currently hospitalized. He had 2 teeth pulled "a couple of days ago." And he has been taking antibiotics twice daily since the extraction.   He presents to the ED today due to poorly controlled dental pain. Reports taking ibuprofen intermittently with transient improvement of his pain. Reports he's been drinking less and "really doing a lot better." He reports compliance with his abx. No fevers, trauma, syncope, vision changes. He's been congested for a couple days, but denies my offer for covid/flu testing.    Denies suicidality or IVDU.   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    Right ankle pain 06/26/2020   Family history of breast cancer    Hx of colonic polyps    Hospital discharge follow-up 06/05/2020   Kidney function test abnormal 06/05/2020   Abdominal pain 04/12/2020   Hypertensive urgency 04/12/2020   Alcohol  withdrawal ([place]) 03/04/2020   Right-sided chest wall pain 02/28/2020   Alcohol abuse with alcohol-induced mood disorder ([place]) 12/02/2019   Moderate episode of recurrent major depressive disorder ([place])    Health care maintenance 10/26/2019   Right knee pain 10/26/2019   Generalized anxiety disorder 10/14/2019   MDD (major depressive disorder), recurrent severe, 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   Social anxiety disorder 07/27/2019   Left-sided chest wall pain 00/71/2197   Alcoholic cirrhosis of liver without ascites ([place]) 06/14/2019   Alcohol abuse 06/14/2019   AKI (acute kidney injury) ([place]) 05/27/2019   Alcohol withdrawal syndrome without complication ([place]) 58/83/2549   Alcoholic intoxication without complication ([place])    Sepsis ([place]) 03/08/2019   Breast lump or mass 10/04/2018   Sepsis secondary to UTI ([place]) 09/14/2018   Asthma 07/07/2018   GERD (gastroesophageal reflux disease) 07/07/2018   OCD (obsessive compulsive disorder) 82/64/1583   Self-inflicted laceration of left wrist ([place]) 03/10/2018   Leg hematoma 12/25/2017   Suicide and self-inflicted injury by cutting and piercing instrument ([place]) 03/10/2017   Severe recurrent major 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 ([place]) 03/09/2017   Substance induced mood disorder ([place]) 08/15/2016   Involuntary commitment 08/15/2016   Alcohol use disorder, severe, dependence ([place]) 02/05/2016   Hypertension 12/05/2015   Tachycardia 12/05/2015   Gout 11/13/2015   Chronic back pain 05/02/2015    Past Surgical History:  Procedure Laterality Date   CHOLECYSTECTOMY  2012   COLONOSCOPY     COLONOSCOPY WITH PROPOFOL N/A 05/28/2020   Procedure: COLONOSCOPY WITH PROPOFOL;  Surgeon: Anna, Kiran, MD;  Location: [facility] ENDOSCOPY;  Service: Gastroenterology;  Laterality: N/A;   EXTRACORPOREAL SHOCK WAVE LITHOTRIPSY Left 01/12/2019   Procedure: EXTRACORPOREAL SHOCK WAVE LITHOTRIPSY (ESWL);  Surgeon: Sninsky, Brian C, MD;  Location: [facility] ORS;  Service:  Urology;  Laterality: Left;   UPPER GI ENDOSCOPY      Prior to Admission medications   Medication Sig Start Date End Date Taking? Authorizing Provider  ADVAIR DISKUS 100-50 MCG/ACT AEPB INHALE 1 PUFF 2 TIMES A DAY. RINSE MOUTH AND SPIT AFTER EACH USE. 08/28/21 08/28/22  Iloabachie, Chioma E, NP  albuterol (VENTOLIN HFA) 108 (90 Base) MCG/ACT inhaler Inhale 2 puffs into the lungs every 4 (four) hours as needed. 08/19/21 08/19/22  Iloabachie, Chioma E, NP  albuterol (VENTOLIN HFA) 108 (90 Base) MCG/ACT inhaler Inhale 2 puffs into the lungs once every 4 (four) hours as needed. 08/28/21 08/28/22  Iloabachie, Chioma E, NP  allopurinol (ZYLOPRIM) 300 MG tablet TAKE ONE TABLET BY MOUTH EVERY DAY 04/15/21 11/01/21  Iloabachie, Chioma E, NP  ascorbic acid (VITAMIN C) 500 MG tablet Take by mouth. 08/01/19   [provider]  folic acid (FOLVITE) 1 MG tablet TAKE ONE TABLET BY MOUTH EVERY DAY 08/28/21 08/28/22  Iloabachie, Chioma E, NP  hydrOXYzine (VISTARIL) 100 MG capsule Take 1 capsule (100 mg total) by mouth 3 (three) times daily as needed (congestion). Patient not taking: Reported on 08/28/2021 07/28/21   Cuthriell, Jonathan D, PA-C  ibuprofen (ADVIL) 800 MG tablet Take 1 tablet (800 mg total) by mouth every 8 (eight) hours as needed. Patient not taking: Reported on 08/28/2021 08/09/21   Veronese, , MD  lisinopril (ZESTRIL) 40 MG tablet TAKE ONE TABLET BY MOUTH EVERY DAY 04/15/21 04/15/22  Iloabachie, Chioma E, NP  loratadine (CLARITIN) 10 MG tablet Take 10 mg by mouth daily.    [provider]  metoprolol tartrate (LOPRESSOR) 25 MG tablet Take 1 tablet (25 mg total) by mouth once daily. 08/28/21 10/09/21  Iloabachie, Chioma E, NP  mirtazapine (REMERON) 15 MG tablet Take 1 tablet (15 mg total) by mouth at bedtime. 05/21/21 06/25/21  Iloabachie, Chioma E, NP  ondansetron (ZOFRAN ODT) 4 MG disintegrating tablet Take 1 tablet (4 mg total) by mouth every 8 (eight) hours as needed. 09/05/21    , Zachary P, MD  oxymetazoline (AFRIN) 0.05 % nasal spray Place 2 sprays into both nostrils 2 (two) times daily. Patient not taking: Reported on 08/28/2021 08/17/21 08/17/22  Goodman, Graydon, MD  pantoprazole (PROTONIX) 40 MG tablet Take 1 tablet (40 mg total) by mouth once daily. 06/28/21 06/28/22  , Zachary P, MD  thiamine (VITAMIN B-1) 100 MG tablet TAKE ONE TABLET BY MOUTH ONCE EVERY DAY. 08/28/21 08/28/22  Iloabachie, Chioma E, NP    Allergies Patient has no known allergies.  Family History  Problem Relation Age of Onset   Alcohol abuse Father    Breast cancer Mother 70   Anxiety disorder Brother    Other Maternal Grandmother        unknown medical history   Other Maternal Grandfather        unknow medical history   Other Paternal Grandmother        unknown medical history   Other Paternal Grandfather        unknown medical history   Healthy Brother       Social History Social History   Tobacco Use   Smoking status: Former    Types: Cigarettes    Quit date: 03/19/1981    Years since quitting: 40.5   Smokeless tobacco: Never  Vaping Use   Vaping Use: Never used  Substance Use Topics   Alcohol use: Yes    Alcohol/week: 24.0 standard drinks    Types: 24 Shots of liquor per week    Comment: alcohol abuse   Drug use: Yes    Types: Marijuana    Review of Systems  Constitutional: No fever/chills Eyes: No visual changes. ENT: No sore throat. Positive for clear congestion and dental pain.  Cardiovascular: Denies chest pain. Respiratory: Denies shortness of breath. Gastrointestinal: No abdominal pain.  No nausea, no vomiting.  No diarrhea.  No constipation. Genitourinary: Negative for dysuria. Musculoskeletal: Negative for back pain. Skin: Negative for rash. Neurological: Negative for headaches, focal weakness or numbness.  ____________________________________________   PHYSICAL EXAM:  VITAL SIGNS: Vitals:   10/11/21 1356  BP: 113/74  Pulse: 90   Resp: 16  Temp: 97.8 F (36.6 C)  SpO2: 95%     Constitutional: Alert and oriented. Well appearing and in no acute distress. Eyes: Conjunctivae are normal. PERRL. EOMI. Head: Atraumatic. Nose: Mild congestion/rhinnorhea. Mouth/Throat: Mucous membranes are moist.  Oropharynx non-erythematous. No focal swelling or signs of periapical abscess. No open sockets. No bleeding. Uvula midline. Tonsils 1+ bilaterally. Neck: No stridor. No cervical spine tenderness to palpation. Cardiovascular: Normal rate, regular rhythm. Grossly normal heart sounds.  Good peripheral circulation. Respiratory: Normal respiratory effort.  No retractions. Lungs CTAB. Gastrointestinal: Soft , nondistended, nontender to palpation. No CVA tenderness. Musculoskeletal: No lower extremity tenderness nor edema.  No joint effusions. No signs of acute trauma. Neurologic:  Normal speech and language. No gross focal neurologic deficits are appreciated. No gait instability noted. Skin:  Skin is warm, dry and intact. No rash noted. Psychiatric: Mood and affect are normal. Speech and behavior are normal.  ____________________________________________   LABS (all labs ordered are listed, but only abnormal results are displayed)  Labs Reviewed - No data to display ____________________________________________  12 Lead EKG   ____________________________________________  RADIOLOGY  ED MD interpretation:    Official radiology report(s): No results found.  ____________________________________________   PROCEDURES and INTERVENTIONS  Procedure(s) performed (including Critical Care):  Procedures  Medications  ketorolac (TORADOL) 30 MG/ML injection 30 mg (30 mg Intramuscular Given 10/11/21 1406)    ____________________________________________   MDM / ED COURSE   Pt presents with dental pain that I suspect is more due to emotional distress in the setting of his wife being hospitalized on his birthday. He looks  clinically well. No signs of periodontal abscess. I can't see where any teeth have been recently pulled and no doubt dry socket. No evidence of systemic illness. No evidence of psychiatric emergency. I provided supportive reassurance, toradol IM for pain and we discussed management at home prior to discharge.     ____________________________________________   FINAL CLINICAL IMPRESSION(S) / ED DIAGNOSES  Final diagnoses:  Pain, dental     ED Discharge Orders     None            Note:  This document was prepared using Dragon voice recognition software and may include unintentional dictation errors.    , , MD 10/11/21 1414  

## 2021-10-11 NOTE — ED Triage Notes (Signed)
Pt here with IVC papers that state he said he wanted to kill himself with aknife. Pt denies current SI or HI.

## 2021-10-11 NOTE — ED Triage Notes (Signed)
Reports recently getting teeth pulled. C/o pain with no relief from ibuprofen.

## 2021-10-11 NOTE — ED Provider Notes (Signed)
[facility] Emergency Department Provider Note  Time seen: 10:29 PM  I have reviewed the triage vital signs and the nursing notes.   HISTORY  Chief Complaint IVC   HPI Miguel [name] is a 60 y.o. male with a past medical history of alcohol abuse, gastric reflux, hypertension, presents to the emergency department for dental pain.  This is the patient's second visit of the day.  Patient admits to drinking alcohol today because it is his birthday.  Patient is a chronic alcoholic well-known to the emergency department and myself.  Patient states he was having dental pain which is why he called EMS to bring him to the emergency department.  States he recently had some teeth pulled several weeks ago.  Patient states police got involved at his house and he might have said something that was misinterpreted as wanting to hurt himself and they brought him here under IVC.  Patient specifically denies any thoughts to hurt himself or anybody else.  States he did not say that he wanted to hurt himself, but he states the police officer inferred something different.  Patient's only complaint is dental pain.  States he feels like he is withdrawing somewhat as he has been here over 4 hours.   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    Right ankle pain 06/26/2020   Family history of breast cancer    Hx of colonic polyps    Hospital discharge follow-up 06/05/2020   Kidney function test  abnormal 06/05/2020   Abdominal pain 04/12/2020   Hypertensive urgency 04/12/2020   Alcohol withdrawal ([place]) 03/04/2020   Right-sided chest wall pain 02/28/2020   Alcohol abuse with alcohol-induced mood disorder ([place]) 12/02/2019   Moderate episode of recurrent major depressive disorder ([place])    Health care maintenance 10/26/2019   Right knee pain 10/26/2019   Generalized anxiety disorder 10/14/2019   MDD (major depressive disorder), recurrent severe, without psychosis ([place]) 07/27/2019   Social anxiety disorder 07/27/2019   Left-sided chest wall pain 41/96/2229   Alcoholic cirrhosis of liver without ascites ([place]) 06/14/2019   Alcohol abuse 06/14/2019   AKI (acute kidney injury) ([place]) 05/27/2019   Alcohol withdrawal syndrome without complication ([place]) 79/89/2119   Alcoholic intoxication without complication ([place])    Sepsis ([place]) 03/08/2019   Breast lump or mass 10/04/2018   Sepsis secondary to UTI ([place]) 09/14/2018   Asthma 07/07/2018   GERD (gastroesophageal reflux disease) 07/07/2018   OCD (obsessive compulsive disorder) 41/74/0814   Self-inflicted laceration of left wrist ([place]) 03/10/2018   Leg hematoma 12/25/2017   Suicide and self-inflicted injury by cutting and piercing instrument ([place]) 03/10/2017   Severe recurrent major 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 ([place]) 03/09/2017   Substance induced mood disorder ([place]) 08/15/2016   Involuntary commitment 08/15/2016   Alcohol use disorder, severe, dependence ([place]) 02/05/2016  Hypertension 12/05/2015   Tachycardia 12/05/2015   Gout 11/13/2015   Chronic back pain 05/02/2015    Past Surgical History:  Procedure Laterality Date   CHOLECYSTECTOMY  2012   COLONOSCOPY     COLONOSCOPY WITH PROPOFOL N/A 05/28/2020   Procedure: COLONOSCOPY WITH PROPOFOL;  Surgeon: [name], MD;  Location: [facility] ENDOSCOPY;  Service: Gastroenterology;  Laterality: N/A;   EXTRACORPOREAL SHOCK WAVE LITHOTRIPSY Left 01/12/2019   Procedure: EXTRACORPOREAL  SHOCK WAVE LITHOTRIPSY (ESWL);  Surgeon: Billey Co, MD;  Location: [facility] ORS;  Service: Urology;  Laterality: Left;   UPPER GI ENDOSCOPY      Prior to Admission medications   Medication Sig Start Date End Date Taking? Authorizing Provider  ADVAIR DISKUS 100-50 MCG/ACT AEPB INHALE 1 PUFF 2 TIMES A DAY. RINSE MOUTH AND SPIT AFTER EACH USE. 08/28/21 08/28/22  Iloabachie, Chioma E, NP  albuterol (VENTOLIN HFA) 108 (90 Base) MCG/ACT inhaler Inhale 2 puffs into the lungs every 4 (four) hours as needed. 08/19/21 08/19/22  Iloabachie, Chioma E, NP  albuterol (VENTOLIN HFA) 108 (90 Base) MCG/ACT inhaler Inhale 2 puffs into the lungs once every 4 (four) hours as needed. 08/28/21 08/28/22  Iloabachie, Chioma E, NP  allopurinol (ZYLOPRIM) 300 MG tablet TAKE ONE TABLET BY MOUTH EVERY DAY 04/15/21 11/01/21  Iloabachie, Chioma E, NP  ascorbic acid (VITAMIN C) 500 MG tablet Take by mouth. 08/01/19   [provider]  folic acid (FOLVITE) 1 MG tablet TAKE ONE TABLET BY MOUTH EVERY DAY 08/28/21 08/28/22  Iloabachie, Chioma E, NP  hydrOXYzine (VISTARIL) 100 MG capsule Take 1 capsule (100 mg total) by mouth 3 (three) times daily as needed (congestion). Patient not taking: Reported on 08/28/2021 07/28/21   Cuthriell, Charline Bills, PA-C  ibuprofen (ADVIL) 800 MG tablet Take 1 tablet (800 mg total) by mouth every 8 (eight) hours as needed. Patient not taking: Reported on 08/28/2021 08/09/21   Rudene Re, MD  lisinopril (ZESTRIL) 40 MG tablet TAKE ONE TABLET BY MOUTH EVERY DAY 04/15/21 04/15/22  Iloabachie, Chioma E, NP  loratadine (CLARITIN) 10 MG tablet Take 10 mg by mouth daily.    [provider]  metoprolol tartrate (LOPRESSOR) 25 MG tablet Take 1 tablet (25 mg total) by mouth once daily. 08/28/21 10/09/21  Iloabachie, Chioma E, NP  mirtazapine (REMERON) 15 MG tablet Take 1 tablet (15 mg total) by mouth at bedtime. 05/21/21 06/25/21  Iloabachie, Chioma E, NP  ondansetron (ZOFRAN ODT) 4 MG  disintegrating tablet Take 1 tablet (4 mg total) by mouth every 8 (eight) hours as needed. 09/05/21   [name], MD  oxymetazoline (AFRIN) 0.05 % nasal spray Place 2 sprays into both nostrils 2 (two) times daily. Patient not taking: Reported on 08/28/2021 08/17/21 08/17/22  Nance Pear, MD  pantoprazole (PROTONIX) 40 MG tablet Take 1 tablet (40 mg total) by mouth once daily. 06/28/21 06/28/22  [name], MD  thiamine (VITAMIN B-1) 100 MG tablet TAKE ONE TABLET BY MOUTH ONCE EVERY DAY. 08/28/21 08/28/22  Iloabachie, Chioma E, NP    No Known Allergies  Family History  Problem Relation Age of Onset   Alcohol abuse Father    Breast cancer Mother 53   Anxiety disorder Brother    Other Maternal Grandmother        unknown medical history   Other Maternal Grandfather        unknow medical history   Other Paternal Grandmother        unknown medical history   Other  Paternal Grandfather        unknown medical history   Healthy Brother     Social History Social History   Tobacco Use   Smoking status: Former    Types: Cigarettes    Quit date: 03/19/1981    Years since quitting: 40.5   Smokeless tobacco: Never  Vaping Use   Vaping Use: Never used  Substance Use Topics   Alcohol use: Yes    Alcohol/week: 24.0 standard drinks    Types: 24 Shots of liquor per week    Comment: alcohol abuse   Drug use: Yes    Types: Marijuana    Review of Systems Constitutional: Negative for fever.  Positive for dental pain Cardiovascular: Negative for chest pain. Respiratory: Negative for shortness of breath.  Negative for cough. Gastrointestinal: Negative for abdominal pain, vomiting Genitourinary: Negative for urinary compaints Musculoskeletal: Negative for musculoskeletal complaints Neurological: Negative for headache All other ROS negative  ____________________________________________   PHYSICAL EXAM:  VITAL SIGNS: ED Triage Vitals [10/11/21 1932]  Enc Vitals Group      BP (!) 141/91     Pulse Rate 99     Resp 16     Temp 98 F (36.7 C)     Temp Source Oral     SpO2 100 %     Weight 189 lb 9.5 oz (86 kg)     Height 6' (1.829 m)     Head Circumference      Peak Flow      Pain Score 0     Pain Loc      Pain Edu?      Excl. in [place]?     Constitutional: Alert and oriented. Well appearing and in no distress. Eyes: Normal exam ENT      Head: Normocephalic and atraumatic.      Mouth/Throat: Mucous membranes are moist.  No objective sign of infection. Cardiovascular: Normal rate, regular rhythm. Respiratory: Normal respiratory effort without tachypnea nor retractions. Breath sounds are clear  Gastrointestinal: Soft and nontender. No distention.  Musculoskeletal: Nontender with normal range of motion in all extremities.  Neurologic:  Normal speech and language. No gross focal neurologic deficits Skin:  Skin is warm, dry and intact.  Psychiatric: Mood and affect are normal.  Denies SI or HI.  ____________________________________________   INITIAL IMPRESSION / ASSESSMENT AND PLAN / ED COURSE  Pertinent labs & imaging results that were available during my care of the patient were reviewed by me and considered in my medical decision making (see chart for details).   Patient presents emergency department for dental pain ultimately placed under IVC for making a threat against himself.  Patient states he did not make a threat against himself and he has no intention of hurting himself or anybody else.  Patient does admit to drinking alcohol today because it is his birthday and states his wife is currently at [facility] for a broken leg and was not home.  Patient states he feels well and wants to go home.  Patient's labs have resulted showing alcohol intoxication however the patient is now been in the emergency department for greater than 4 hours.  Patient is a little shaky and states he feels like he is beginning to withdrawal.  He is awake alert.  Able  to give a good history.  Does not appear clinically intoxicated even though his alcohol level is 373 upon arrival.  Patient is well-known to myself, do not believe the patient would benefit  from psychiatric evaluation as the patient denies any suicidal ideation or thoughts of hurting himself or anyone else.  Patient will likely need to sober up before being discharged.  We will discontinue the IVC and discharge the patient once he is able to obtain a safe ride home.  Patient agreeable to plan of care.  Remainder of the work-up is largely nonrevealing.  Miguel [name] was evaluated in Emergency Department on 10/11/2021 for the symptoms described in the history of present illness. He was evaluated in the context of the global COVID-19 pandemic, which necessitated consideration that the patient might be at risk for infection with the SARS-CoV-2 virus that causes COVID-19. Institutional protocols and algorithms that pertain to the evaluation of patients at risk for COVID-19 are in a state of rapid change based on information released by regulatory bodies including the CDC and federal and state organizations. These policies and algorithms were followed during the patient's care in the ED.  ____________________________________________   FINAL CLINICAL IMPRESSION(S) / ED DIAGNOSES  Alcohol intoxication   Harvest Dark, MD 10/11/21 [id number]

## 2021-10-11 NOTE — Discharge Instructions (Signed)
Please take Tylenol and ibuprofen/Advil for your pain.  It is safe to take them together, or to alternate them every few hours.  Take up to 1000mg of Tylenol at a time, up to 4 times per day.  Do not take more than 4000 mg of Tylenol in 24 hours.  For ibuprofen, take 400-600 mg, 4-5 times per day. ° ° °

## 2021-10-12 ENCOUNTER — Other Ambulatory Visit: Payer: Self-pay

## 2021-10-12 ENCOUNTER — Emergency Department: Payer: Self-pay

## 2021-10-12 DIAGNOSIS — Z79899 Other long term (current) drug therapy: Secondary | ICD-10-CM | POA: Insufficient documentation

## 2021-10-12 DIAGNOSIS — S20211A Contusion of right front wall of thorax, initial encounter: Secondary | ICD-10-CM | POA: Insufficient documentation

## 2021-10-12 DIAGNOSIS — R109 Unspecified abdominal pain: Secondary | ICD-10-CM | POA: Insufficient documentation

## 2021-10-12 DIAGNOSIS — I10 Essential (primary) hypertension: Secondary | ICD-10-CM | POA: Insufficient documentation

## 2021-10-12 DIAGNOSIS — Z87891 Personal history of nicotine dependence: Secondary | ICD-10-CM | POA: Insufficient documentation

## 2021-10-12 DIAGNOSIS — X58XXXA Exposure to other specified factors, initial encounter: Secondary | ICD-10-CM | POA: Insufficient documentation

## 2021-10-12 DIAGNOSIS — J45909 Unspecified asthma, uncomplicated: Secondary | ICD-10-CM | POA: Insufficient documentation

## 2021-10-12 LAB — URINALYSIS, COMPLETE (UACMP) WITH MICROSCOPIC
Bacteria, UA: NONE SEEN
Bilirubin Urine: NEGATIVE
Glucose, UA: NEGATIVE mg/dL
Hgb urine dipstick: NEGATIVE
Ketones, ur: NEGATIVE mg/dL
Nitrite: NEGATIVE
Protein, ur: NEGATIVE mg/dL
Specific Gravity, Urine: 1.025 (ref 1.005–1.030)
pH: 5.5 (ref 5.0–8.0)

## 2021-10-12 LAB — URINE DRUG SCREEN, QUALITATIVE (ARMC ONLY)
Amphetamines, Ur Screen: NOT DETECTED
Barbiturates, Ur Screen: NOT DETECTED
Benzodiazepine, Ur Scrn: NOT DETECTED
Cannabinoid 50 Ng, Ur ~~LOC~~: POSITIVE — AB
Cocaine Metabolite,Ur ~~LOC~~: NOT DETECTED
MDMA (Ecstasy)Ur Screen: NOT DETECTED
Methadone Scn, Ur: NOT DETECTED
Opiate, Ur Screen: NOT DETECTED
Phencyclidine (PCP) Ur S: NOT DETECTED
Tricyclic, Ur Screen: NOT DETECTED

## 2021-10-12 NOTE — ED Notes (Signed)
Pt dressing for discharge.  [employer] coming to give pt a ride home.

## 2021-10-12 NOTE — ED Notes (Signed)
RN called [employer] to request patient be given a ride home.

## 2021-10-12 NOTE — ED Notes (Signed)
Pt with steady ambulation to bathroom.

## 2021-10-12 NOTE — ED Notes (Signed)
Additional orders to be completed prior to discharge.

## 2021-10-12 NOTE — ED Triage Notes (Signed)
Pt presents via EMS c/o right sided abd pain. Reports not eaten x2 days. D/c from this facility this am.

## 2021-10-12 NOTE — ED Provider Notes (Signed)
Patient received in signout pending clinical sobriety and reassessment.  He has sobered up, ambulated to the restroom and denies suicidality.  Reports blood in his stool and abdominal pain when I go to discharge him.  He has some mild tenderness to the RLQ and LLQ without peritoneal features.  Otherwise looks well.  I add on a UA and CT without contrast of his belly, and these are without acute features.  He is tolerating p.o. intake and I see no barriers to outpatient management.  Will discharge with return precautions.   Vladimir Crofts, MD 10/12/21 [phone]

## 2021-10-12 NOTE — ED Notes (Signed)
Pt taken to CT.

## 2021-10-12 NOTE — ED Notes (Addendum)
Patient c/o back pain and saw red when he used that bathroom.  EDP made aware.

## 2021-10-12 NOTE — ED Notes (Signed)
Pt discharged home.  VS stable. All belongings (phone and wallet) returned to pt.

## 2021-10-12 NOTE — ED Notes (Signed)
EDP at bedside to assess patient.

## 2021-10-12 NOTE — ED Notes (Signed)
Pt up to bathroom.

## 2021-10-13 ENCOUNTER — Emergency Department
Admission: EM | Admit: 2021-10-13 | Discharge: 2021-10-13 | Disposition: A | Discharge status: Home / Self Care | Payer: Medicaid Other | Attending: Emergency Medicine | Admitting: Emergency Medicine

## 2021-10-13 DIAGNOSIS — S20211A Contusion of right front wall of thorax, initial encounter: Secondary | ICD-10-CM

## 2021-10-13 MED ORDER — ACETAMINOPHEN 500 MG PO TABS
1000.0000 mg | ORAL_TABLET | Freq: Once | ORAL | Status: AC
  Administered 2021-10-13: 03:00:00 1000 mg via ORAL
  Filled 2021-10-13: qty 2

## 2021-10-13 MED ORDER — LIDOCAINE 5 % EX PTCH
1.0000 | MEDICATED_PATCH | CUTANEOUS | Status: DC
  Administered 2021-10-13: 03:00:00 1 via TRANSDERMAL
  Filled 2021-10-13: qty 1

## 2021-10-13 MED ORDER — ONDANSETRON 4 MG PO TBDP
4.0000 mg | ORAL_TABLET | Freq: Once | ORAL | Status: AC
  Administered 2021-10-13: 03:00:00 4 mg via ORAL
  Filled 2021-10-13: qty 1

## 2021-10-13 NOTE — ED Provider Notes (Signed)
[facility] Emergency Department Provider Note  ____________________________________________  Time seen: Approximately 2:55 AM  I have reviewed the triage vital signs and the nursing notes.   HISTORY  Chief Complaint Abdominal Pain   HPI Miguel [name] is a 60 y.o. male with a history of alcohol abuse who presents for evaluation of right chest wall pain.  Patient was discharged this morning from the emergency department where he underwent CT and x-ray, labs and urinalysis with no acute findings.  Patient does have a bruise to the right chest wall and thinks that he might have fallen.  This is where he is tender.  He has not taken anything at home for the pain. 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    Right ankle pain 06/26/2020   Family history of breast cancer    Hx of colonic polyps    Hospital discharge follow-up 06/05/2020   Kidney function test abnormal 06/05/2020   Abdominal pain 04/12/2020   Hypertensive urgency 04/12/2020   Alcohol withdrawal ([place]) 03/04/2020   Right-sided chest wall pain 02/28/2020   Alcohol abuse with alcohol-induced mood disorder ([place]) 12/02/2019   Moderate episode of recurrent major depressive disorder ([place])    Health care maintenance 10/26/2019   Right knee pain 10/26/2019   Generalized anxiety disorder 10/14/2019   MDD (major depressive disorder),  recurrent severe, without psychosis ([place]) 07/27/2019   Social anxiety disorder 07/27/2019   Left-sided chest wall pain 09/98/3382   Alcoholic cirrhosis of liver without ascites ([place]) 06/14/2019   Alcohol abuse 06/14/2019   AKI (acute kidney injury) ([place]) 05/27/2019   Alcohol withdrawal syndrome without complication ([name]) 50/53/9767   Alcoholic intoxication without complication ([place])    Sepsis ([place]) 03/08/2019   Breast lump or mass 10/04/2018   Sepsis secondary to UTI ([place]) 09/14/2018   Asthma 07/07/2018   GERD (gastroesophageal reflux disease) 07/07/2018   OCD (obsessive compulsive disorder) 34/19/3790   Self-inflicted laceration of left wrist ([place]) 03/10/2018   Leg hematoma 12/25/2017   Suicide and self-inflicted injury by cutting and piercing instrument ([place]) 03/10/2017   Severe recurrent major 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 ([place]) 03/09/2017   Substance induced mood disorder ([place]) 08/15/2016   Involuntary commitment 08/15/2016   Alcohol use disorder, severe, dependence ([place]) 02/05/2016   Hypertension 12/05/2015   Tachycardia 12/05/2015   Gout 11/13/2015   Chronic back pain 05/02/2015    Past Surgical History:  Procedure Laterality Date   CHOLECYSTECTOMY  2012   COLONOSCOPY     COLONOSCOPY WITH PROPOFOL N/A 05/28/2020   Procedure: COLONOSCOPY WITH PROPOFOL;  Surgeon: [name], MD;  Location: [facility] ENDOSCOPY;  Service: Gastroenterology;  Laterality: N/A;   EXTRACORPOREAL SHOCK WAVE LITHOTRIPSY Left 01/12/2019   Procedure: EXTRACORPOREAL SHOCK WAVE LITHOTRIPSY (ESWL);  Surgeon:  Billey Co, MD;  Location: [facility] ORS;  Service: Urology;  Laterality: Left;   UPPER GI ENDOSCOPY      Prior to Admission medications   Medication Sig Start Date End Date Taking? Authorizing Provider  ADVAIR DISKUS 100-50 MCG/ACT AEPB INHALE 1 PUFF 2 TIMES A DAY. RINSE MOUTH AND SPIT AFTER EACH USE. 08/28/21 08/28/22  Iloabachie, Chioma E, NP  albuterol (VENTOLIN HFA) 108 (90 Base) MCG/ACT  inhaler Inhale 2 puffs into the lungs every 4 (four) hours as needed. 08/19/21 08/19/22  Iloabachie, Chioma E, NP  albuterol (VENTOLIN HFA) 108 (90 Base) MCG/ACT inhaler Inhale 2 puffs into the lungs once every 4 (four) hours as needed. 08/28/21 08/28/22  Iloabachie, Chioma E, NP  allopurinol (ZYLOPRIM) 300 MG tablet TAKE ONE TABLET BY MOUTH EVERY DAY 04/15/21 11/01/21  Iloabachie, Chioma E, NP  ascorbic acid (VITAMIN C) 500 MG tablet Take by mouth. 08/01/19   [provider]  folic acid (FOLVITE) 1 MG tablet TAKE ONE TABLET BY MOUTH EVERY DAY 08/28/21 08/28/22  Iloabachie, Chioma E, NP  hydrOXYzine (VISTARIL) 100 MG capsule Take 1 capsule (100 mg total) by mouth 3 (three) times daily as needed (congestion). Patient not taking: Reported on 08/28/2021 07/28/21   Cuthriell, Charline Bills, PA-C  ibuprofen (ADVIL) 800 MG tablet Take 1 tablet (800 mg total) by mouth every 8 (eight) hours as needed. Patient not taking: Reported on 08/28/2021 08/09/21   Rudene Re, MD  lisinopril (ZESTRIL) 40 MG tablet TAKE ONE TABLET BY MOUTH EVERY DAY 04/15/21 04/15/22  Iloabachie, Chioma E, NP  loratadine (CLARITIN) 10 MG tablet Take 10 mg by mouth daily.    [provider]  metoprolol tartrate (LOPRESSOR) 25 MG tablet Take 1 tablet (25 mg total) by mouth once daily. 08/28/21 10/09/21  Iloabachie, Chioma E, NP  mirtazapine (REMERON) 15 MG tablet Take 1 tablet (15 mg total) by mouth at bedtime. 05/21/21 06/25/21  Iloabachie, Chioma E, NP  ondansetron (ZOFRAN ODT) 4 MG disintegrating tablet Take 1 tablet (4 mg total) by mouth every 8 (eight) hours as needed. 09/05/21   [name], MD  oxymetazoline (AFRIN) 0.05 % nasal spray Place 2 sprays into both nostrils 2 (two) times daily. Patient not taking: Reported on 08/28/2021 08/17/21 08/17/22  Nance Pear, MD  pantoprazole (PROTONIX) 40 MG tablet Take 1 tablet (40 mg total) by mouth once daily. 06/28/21 06/28/22  [name], MD  thiamine (VITAMIN B-1)  100 MG tablet TAKE ONE TABLET BY MOUTH ONCE EVERY DAY. 08/28/21 08/28/22  Iloabachie, Chioma E, NP    Allergies Patient has no known allergies.  Family History  Problem Relation Age of Onset   Alcohol abuse Father    Breast cancer Mother 37   Anxiety disorder Brother    Other Maternal Grandmother        unknown medical history   Other Maternal Grandfather        unknow medical history   Other Paternal Grandmother        unknown medical history   Other Paternal Grandfather        unknown medical history   Healthy Brother     Social History Social History   Tobacco Use   Smoking status: Former    Types: Cigarettes    Quit date: 03/19/1981    Years since quitting: 40.5   Smokeless tobacco: Never  Vaping Use   Vaping Use: Never used  Substance Use Topics   Alcohol use: Yes    Alcohol/week: 24.0  standard drinks    Types: 24 Shots of liquor per week    Comment: alcohol abuse   Drug use: Yes    Types: Marijuana    Review of Systems  Constitutional: Negative for fever. Eyes: Negative for visual changes. ENT: Negative for sore throat. Neck: No neck pain  Cardiovascular: Negative for chest pain.  Respiratory: Negative for shortness of breath. Gastrointestinal: Negative for abdominal pain, vomiting or diarrhea. Genitourinary: Negative for dysuria. Musculoskeletal: Negative for back pain. + R chest wall pain Skin: Negative for rash. Neurological: Negative for headaches, weakness or numbness. Psych: No SI or HI  ____________________________________________   PHYSICAL EXAM:  VITAL SIGNS: ED Triage Vitals  Enc Vitals Group     BP 10/12/21 1655 (!) 152/88     Pulse Rate 10/12/21 1655 94     Resp 10/12/21 1655 18     Temp 10/12/21 1655 98.4 F (36.9 C)     Temp Source 10/12/21 1655 Oral     SpO2 10/12/21 1655 96 %     Weight --      Height --      Head Circumference --      Peak Flow --      Pain Score 10/12/21 1648 8     Pain Loc --      Pain Edu? --       Excl. in [place]? --     Constitutional: Alert and oriented. Well appearing and in no apparent distress. HEENT:      Head: Normocephalic and atraumatic.         Eyes: Conjunctivae are normal. Sclera is non-icteric.       Mouth/Throat: Mucous membranes are moist.       Neck: Supple with no signs of meningismus. Cardiovascular: Regular rate and rhythm. No murmurs, gallops, or rubs. 2+ symmetrical distal pulses are present in all extremities. No JVD. Respiratory: Normal respiratory effort. Lungs are clear to auscultation bilaterally.  Chest wall: There is bruising of the R lateral lower chest wall and patient is tender to palpation over there Gastrointestinal: Soft, non tender, and non distended with positive bowel sounds. No rebound or guarding. Genitourinary: No CVA tenderness. Musculoskeletal:  No edema, cyanosis, or erythema of extremities. Neurologic: Normal speech and language. Face is symmetric. Moving all extremities. No gross focal neurologic deficits are appreciated. Skin: Skin is warm, dry and intact. No rash noted. Psychiatric: Mood and affect are normal. Speech and behavior are normal.  ____________________________________________   LABS (all labs ordered are listed, but only abnormal results are displayed)  Labs Reviewed - No data to display ____________________________________________  EKG  none  ____________________________________________  RADIOLOGY  none  ____________________________________________   PROCEDURES  Procedure(s) performed: None Procedures   Critical Care performed:  None ____________________________________________   INITIAL IMPRESSION / ASSESSMENT AND PLAN / ED COURSE  60 y.o. male with a history of alcohol abuse who presents for evaluation of right chest wall pain.  Patient has a bruise on his right lateral lower chest wall from a fall while intoxicated 2 days ago.  Was seen less than 24 hours ago for the same complaint with negative imaging  showing no signs of pneumothorax, rib fractures or any other pathology.  He is only tender at that location, he has no abdominal tenderness.  We will give a lidocaine patch, Tylenol.  Patient is also vomiting.  Reports he has not had a drink since he left this morning.  No signs of complicated withdrawal.  We will  give a dose of Zofran.  Otherwise patient is cleared for discharge.  I did review all the blood work and imaging done less than 24 hours ago.      _____________________________________________ Please note:  Patient was evaluated in Emergency Department today for the symptoms described in the history of present illness. Patient was evaluated in the context of the global COVID-19 pandemic, which necessitated consideration that the patient might be at risk for infection with the SARS-CoV-2 virus that causes COVID-19. Institutional protocols and algorithms that pertain to the evaluation of patients at risk for COVID-19 are in a state of rapid change based on information released by regulatory bodies including the CDC and federal and state organizations. These policies and algorithms were followed during the patient's care in the ED.  Some ED evaluations and interventions may be delayed as a result of limited staffing during the pandemic.   [place] Controlled Substance Database was reviewed by me. ____________________________________________   FINAL CLINICAL IMPRESSION(S) / ED DIAGNOSES   Final diagnoses:  Contusion of right chest wall, initial encounter      NEW MEDICATIONS STARTED DURING THIS VISIT:  ED Discharge Orders     None        Note:  This document was prepared using Dragon voice recognition software and may include unintentional dictation errors.    Rudene Re, MD 10/13/21 [phone]

## 2021-10-13 NOTE — ED Notes (Signed)
Pt ambulated to triage 3 to see provider.  Pt began dry heaving immed after being roomed.  Pt has not vomited the entire time he was in the lobby.  Pt c/o severe nausea.

## 2021-10-16 IMAGING — CR DG CHEST 2V
1 series · 2 of 2 positions shown · non-contrast
Comparison: 12/31/2020, 12/11/2020

CLINICAL DATA: Chest pain

EXAM:
CHEST - 2 VIEW

[Series 1: dg chest 2 view · 0.14mm/px · 2 of 2 slices shown]
[im 1/2]
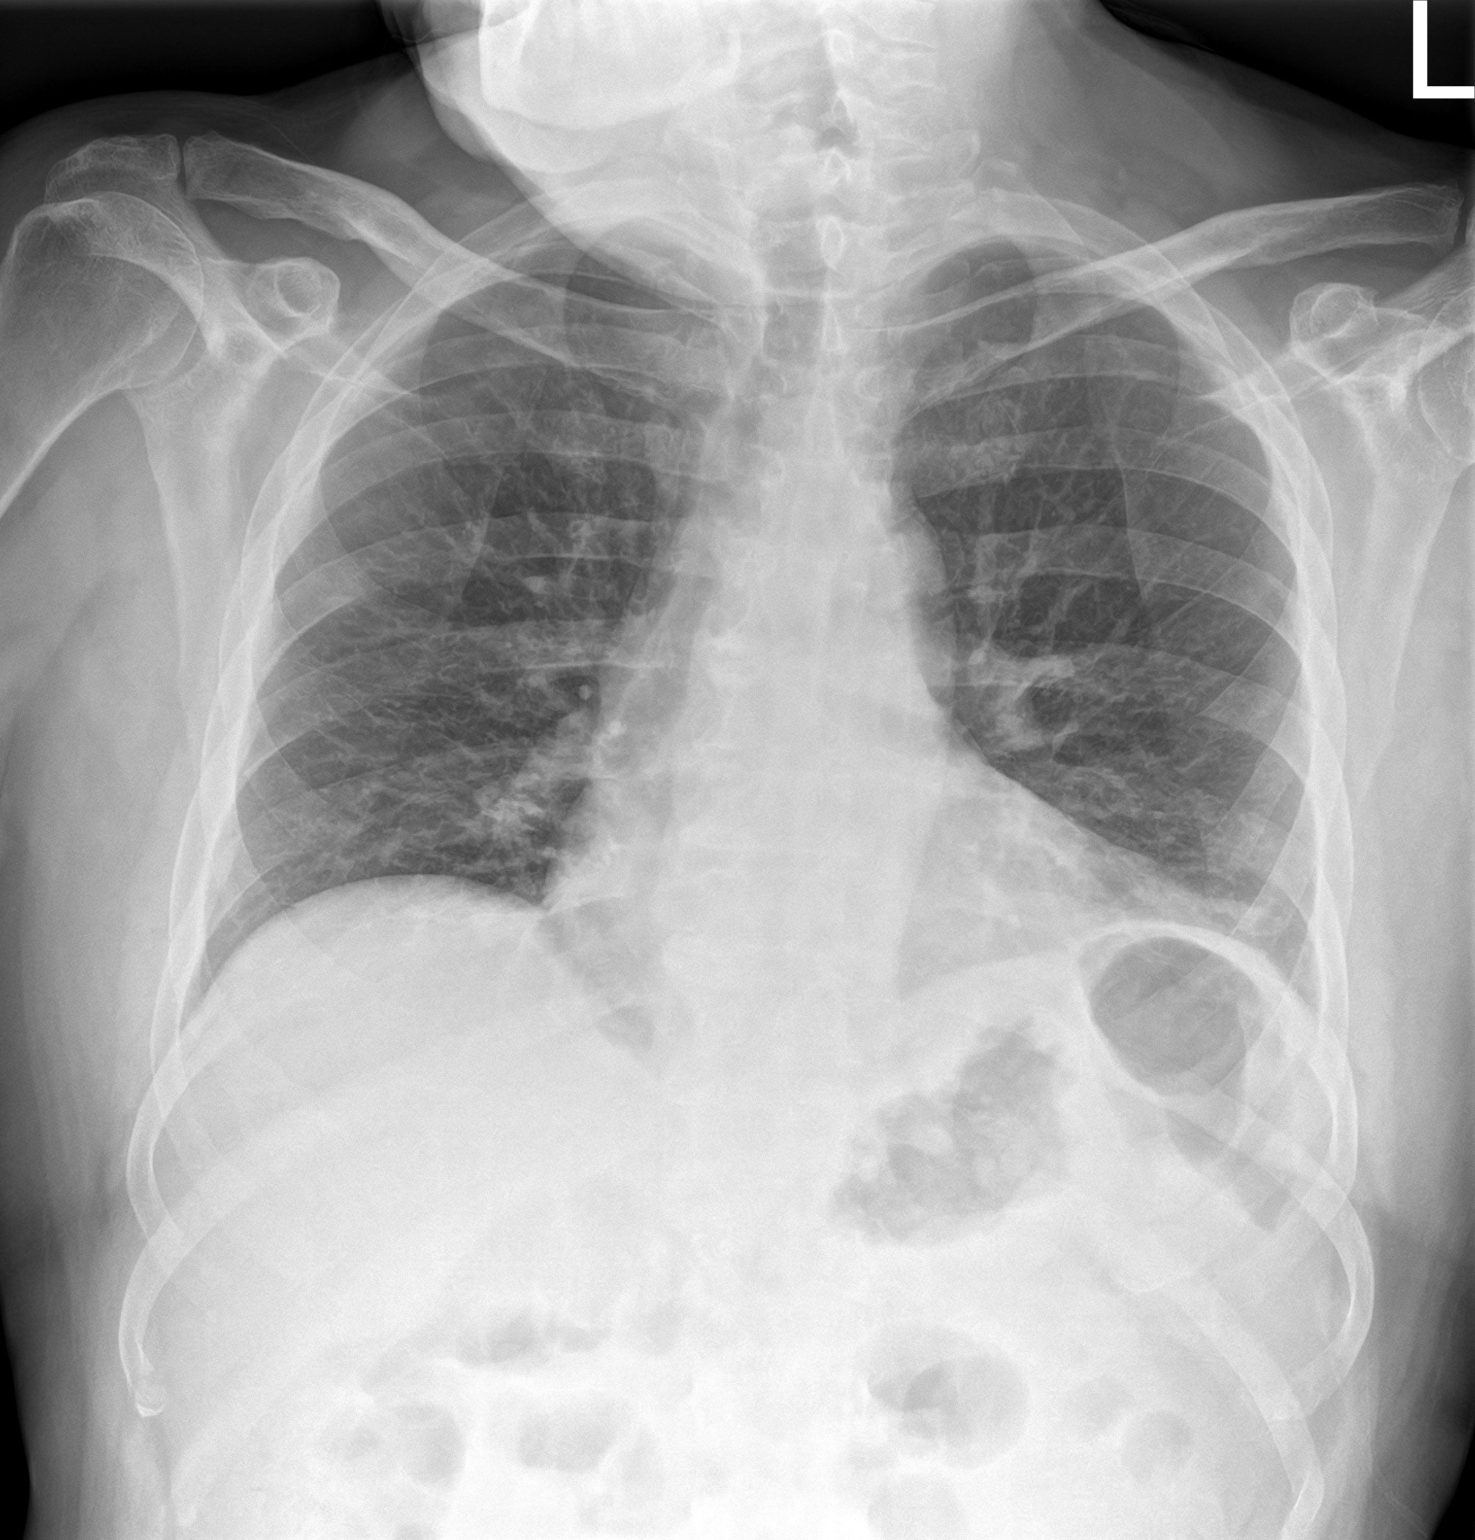
[im 2/2]
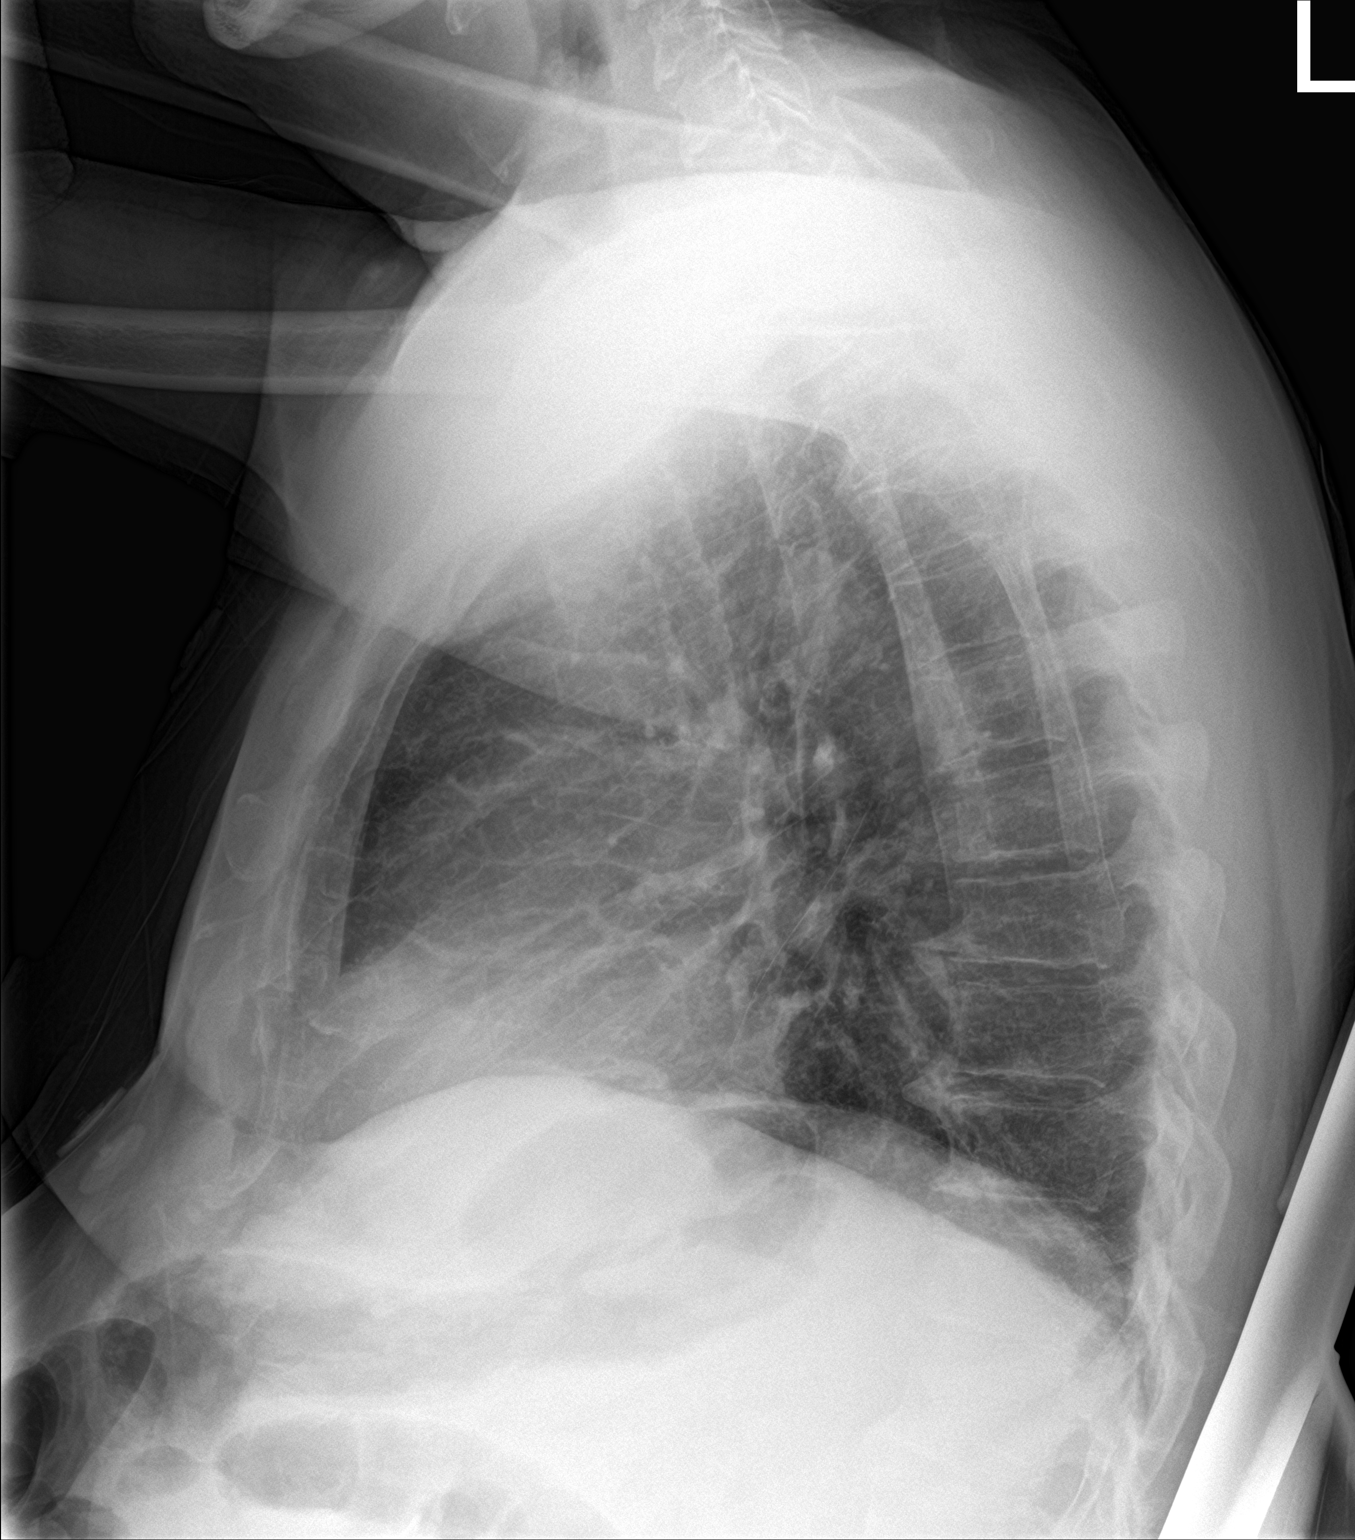

[2 of 2 positions shown; findings below may reference images not displayed]

FINDINGS: Minimal bronchitic change. Minimal atelectasis at the lingula.
Stable cardiomediastinal silhouette. No pneumothorax.
IMPRESSION: Minimal bronchitic change with minimal lingular atelectasis.

## 2021-10-18 IMAGING — CT CT CERVICAL SPINE W/O CM
3 of 4 series · 12 of 35 positions shown, 14 images · non-contrast
Comparison: 12/18/2020

CLINICAL DATA: Intoxicated, fell, loss of consciousness

EXAM:
CT CERVICAL SPINE WITHOUT CONTRAST
TECHNIQUE: Multidetector CT imaging of the cervical spine was performed without
intravenous contrast. Multiplanar CT image reconstructions were also
generated.

[Series 4: sagittal bone · sagittal · 0.28mm/px · 5 of 70 slices shown, 6 images]
[im 24/70  bone]
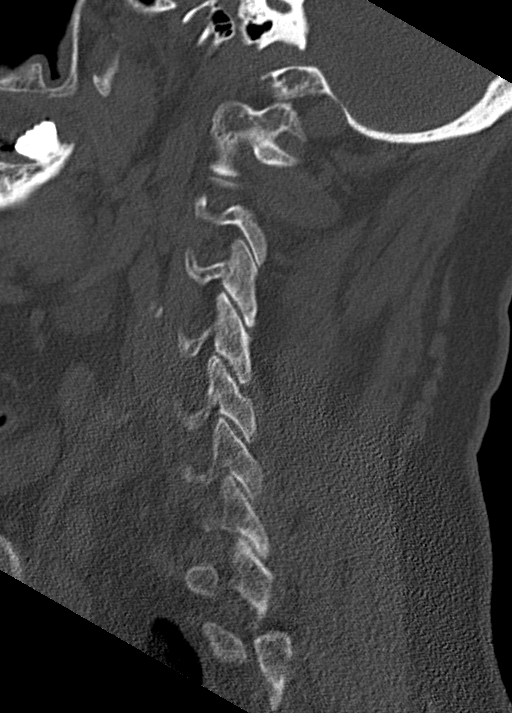
[im 29/70  bone]
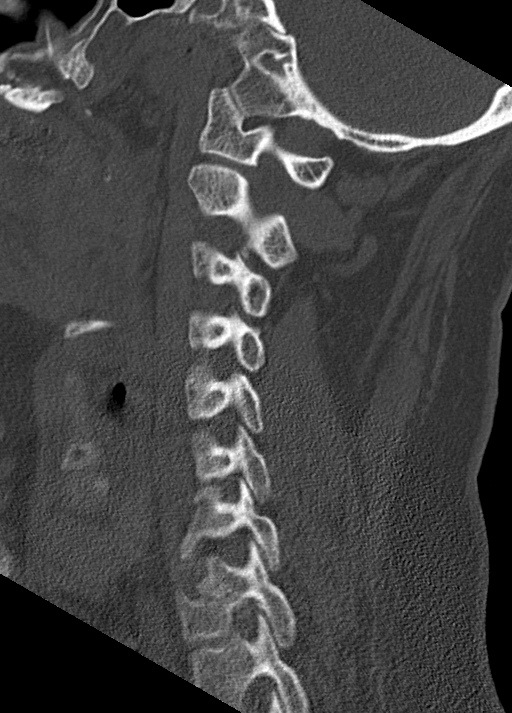
[im 35/70  soft-tissue]
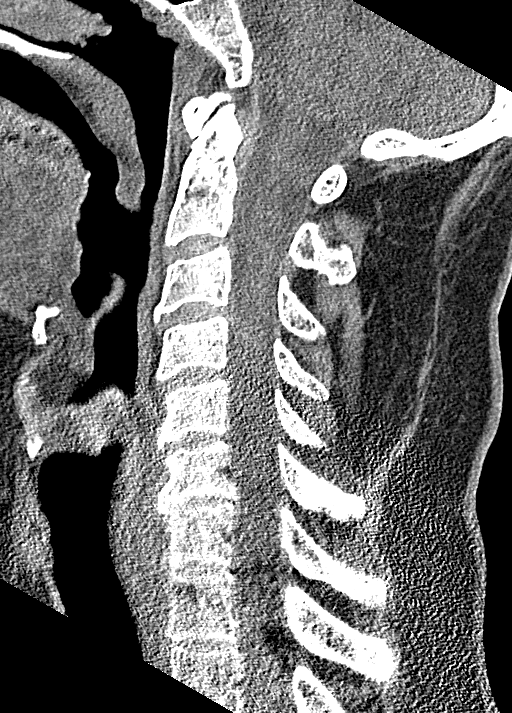
[im 35/70  bone]
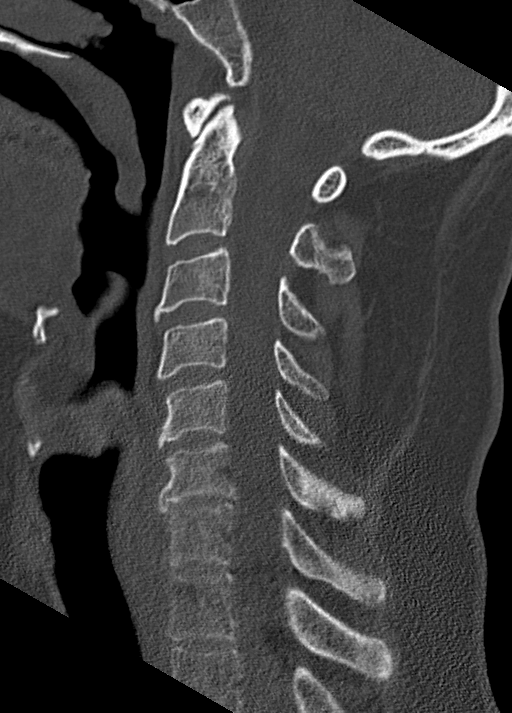
[im 41/70  bone]
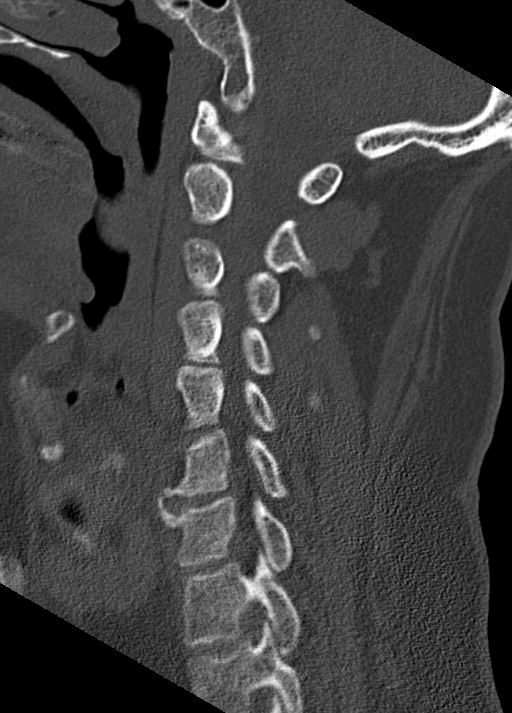
[im 47/70  bone]
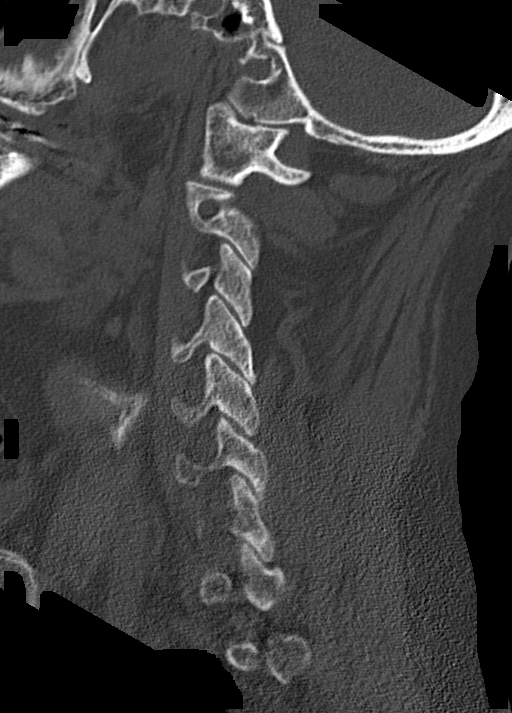

[Series 5: coronal bone · coronal · 0.24mm/px · 3 of 57 slices shown]
[im 14/57  bone]
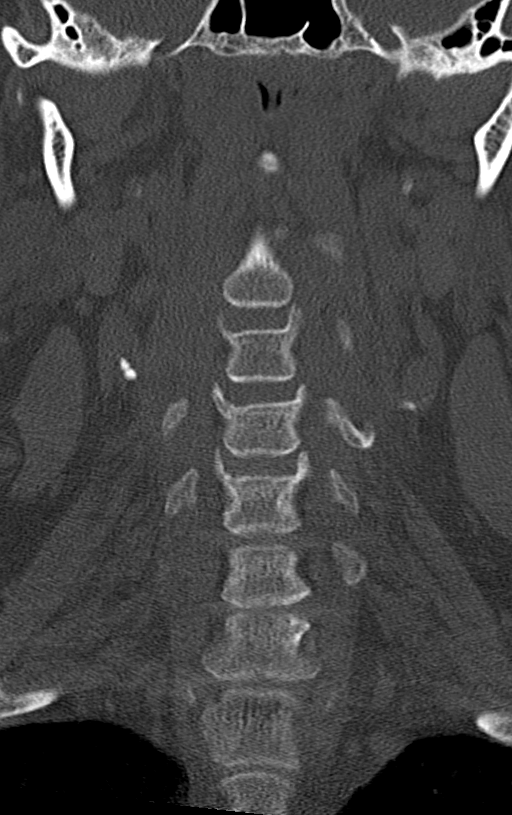
[im 24/57  bone]
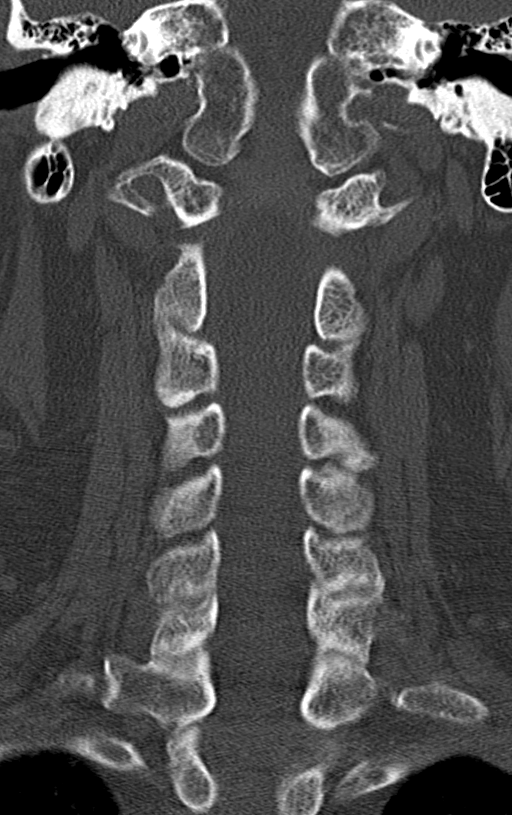
[im 34/57  bone]
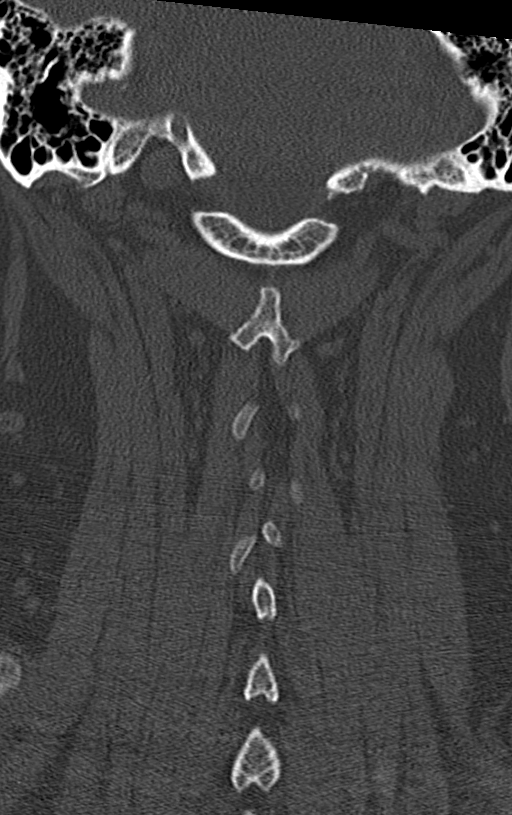

[Series 6: orthogonal bone · axial · 0.26mm/px · z∈[-306,-189]mm · 4 of 96 slices shown, 5 images]
[im 16/96  soft-tissue]
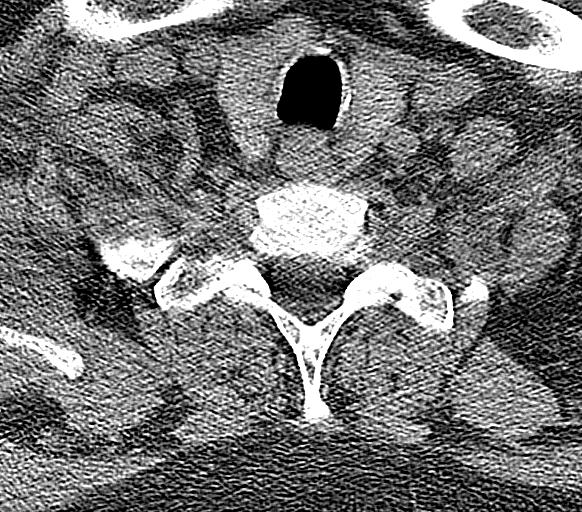
[im 16/96  bone]
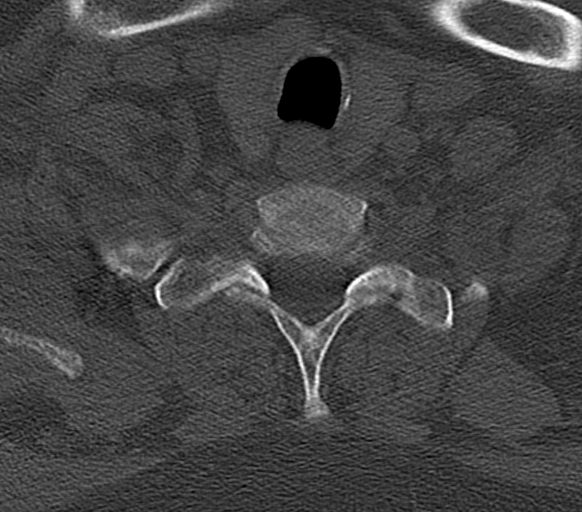
[im 32/96  bone]
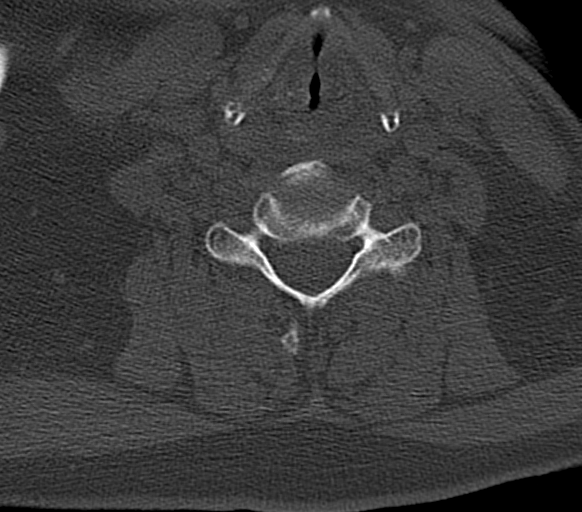
[im 64/96  bone]
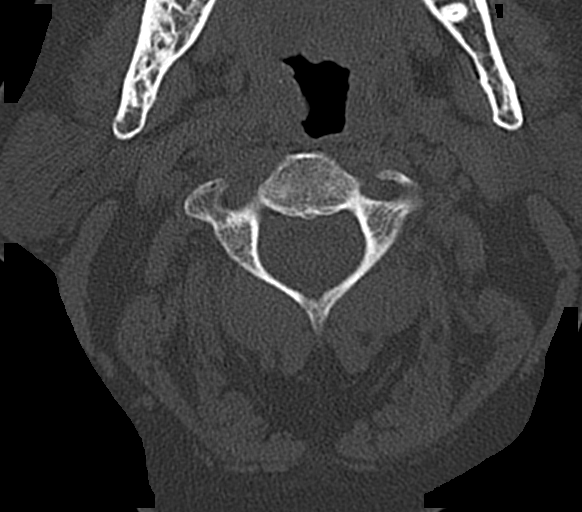
[im 80/96  bone]
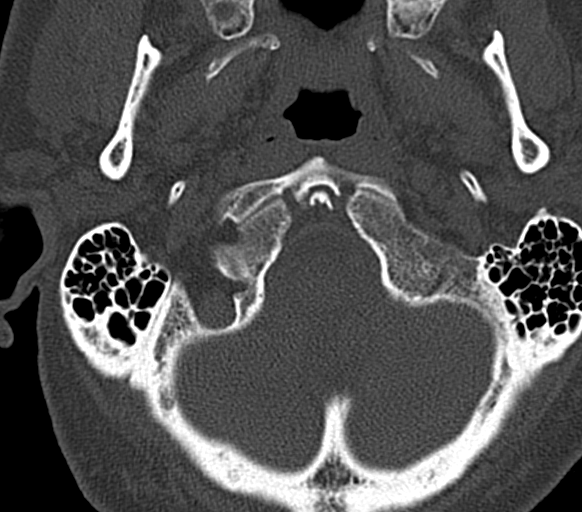

[12 of 35 positions shown; findings below may reference images not displayed]

FINDINGS: Alignment: Alignment is anatomic.

Skull base and vertebrae: No acute fracture. No primary bone lesion
or focal pathologic process.

Soft tissues and spinal canal: No prevertebral fluid or swelling. No
visible canal hematoma.

Disc levels: Mild spondylosis at C3-4, C5-6, and C6-7. No
significant neural foraminal or central canal stenosis.

Upper chest: Airway is patent.  Lung apices are clear.

Other: Reconstructed images demonstrate no additional findings.
IMPRESSION: 1. No acute cervical spine fracture.

## 2021-10-18 IMAGING — DX DG CHEST 1V PORT
1 series · 1 of 1 positions shown · non-contrast
Comparison: 01/15/2021

CLINICAL DATA: Cough and wheezing.  Fall today

EXAM:
PORTABLE CHEST 1 VIEW

[chest ap]
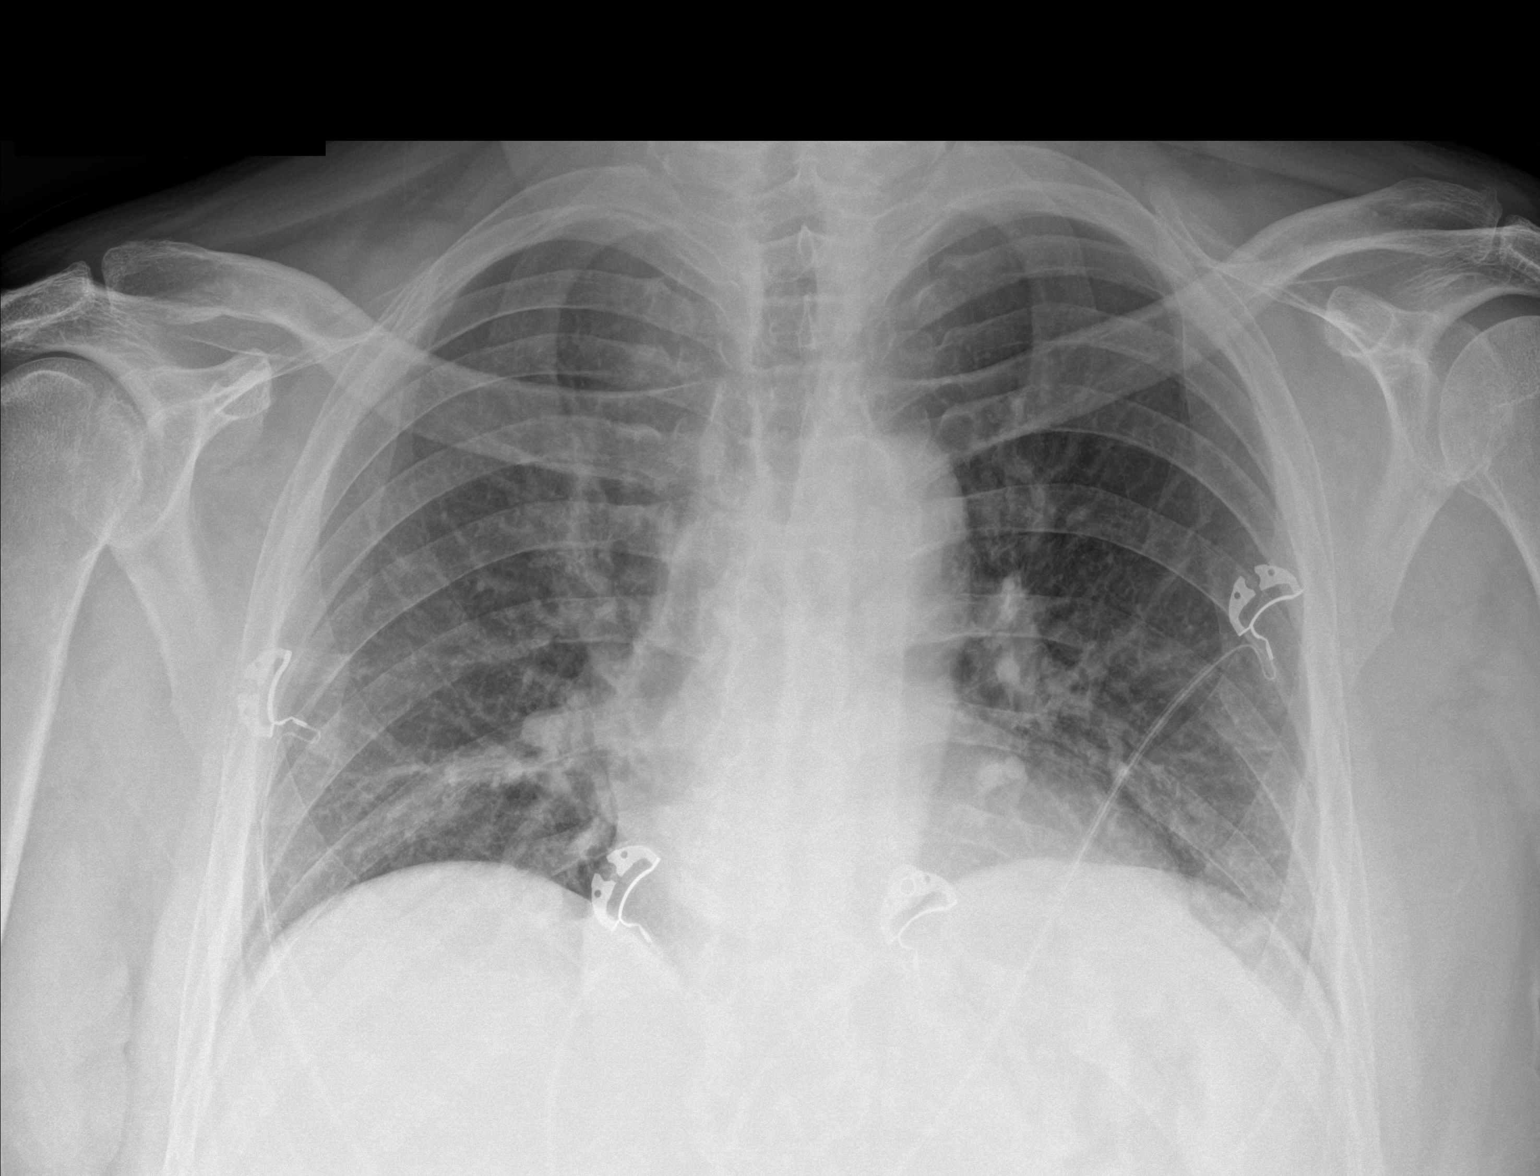

[1 of 1 positions shown; findings below may reference images not displayed]

FINDINGS: Hypoventilation. Decreased lung volume with bibasilar atelectasis
which has increased since the prior study. Negative for heart
failure or pneumonia. No effusion.
IMPRESSION: Hypoventilation with bibasilar atelectasis.

## 2021-10-23 ENCOUNTER — Other Ambulatory Visit: Payer: Self-pay

## 2021-10-24 ENCOUNTER — Other Ambulatory Visit: Payer: Self-pay

## 2021-10-25 IMAGING — CT CT CERVICAL SPINE W/O CM
3 of 4 series · 13 of 33 positions shown, 16 images · non-contrast
Comparison: January 17, 2021

CLINICAL DATA: Status post fall.

EXAM:
CT CERVICAL SPINE WITHOUT CONTRAST
TECHNIQUE: Multidetector CT imaging of the cervical spine was performed without
intravenous contrast. Multiplanar CT image reconstructions were also
generated.

[Series 4: sagittal bone · sagittal · 0.28mm/px · 5 of 73 slices shown, 6 images]
[im 25/73  bone]
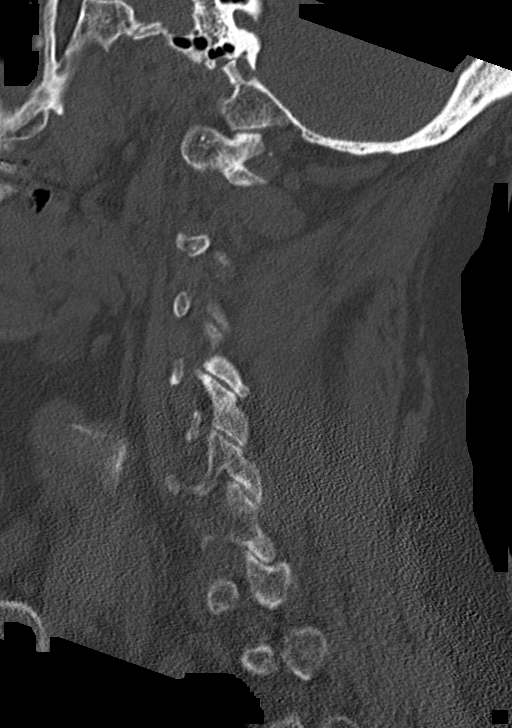
[im 31/73  bone]
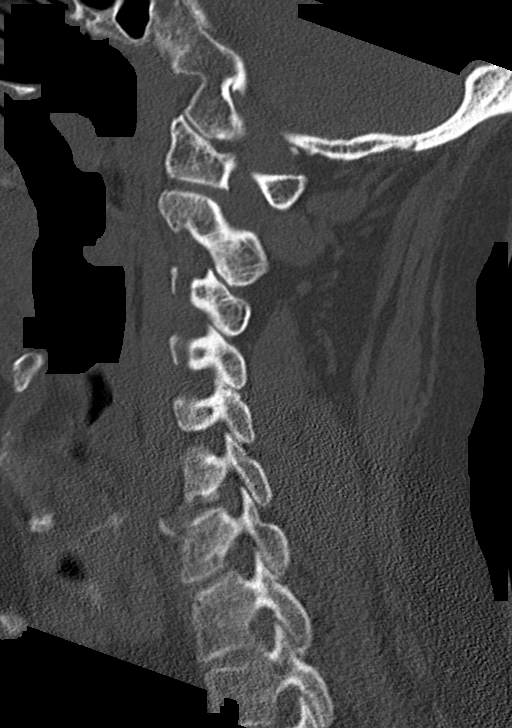
[im 37/73  soft-tissue]
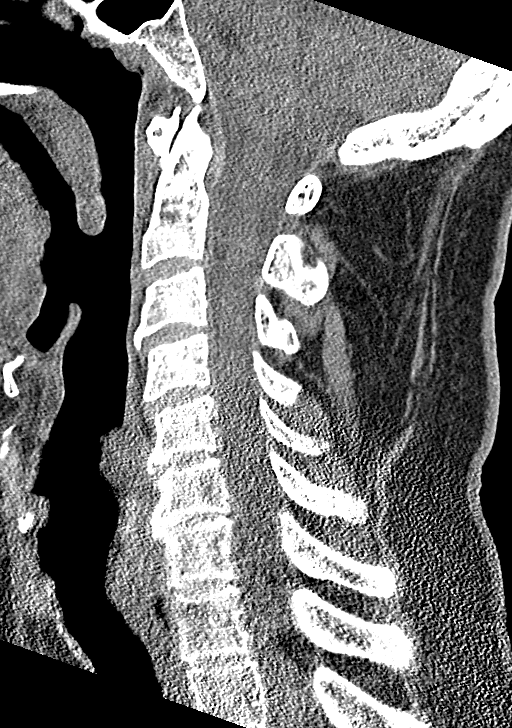
[im 37/73  bone]
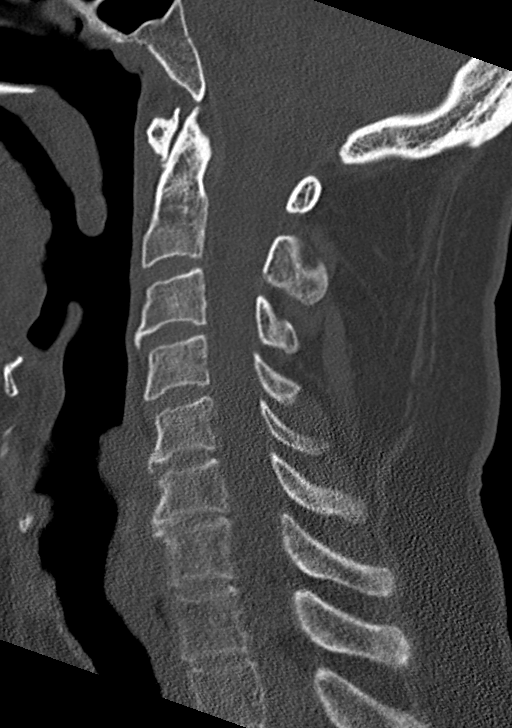
[im 43/73  bone]
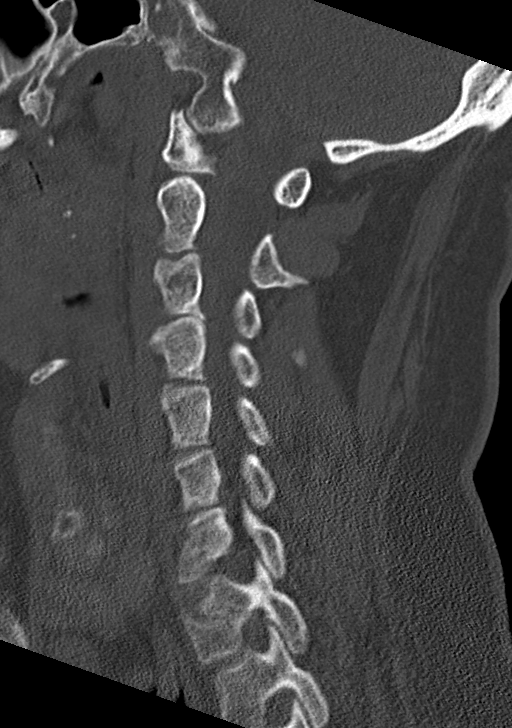
[im 49/73  bone]
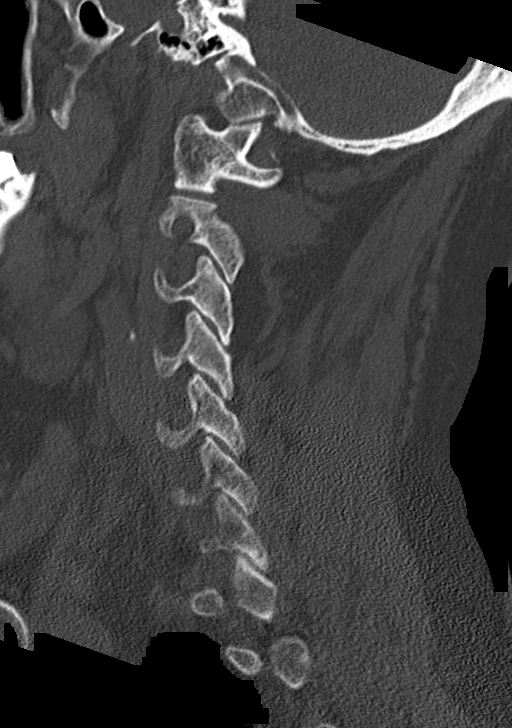

[Series 5: coronal bone · coronal · 0.33mm/px · 3 of 64 slices shown]
[im 13/64  bone]
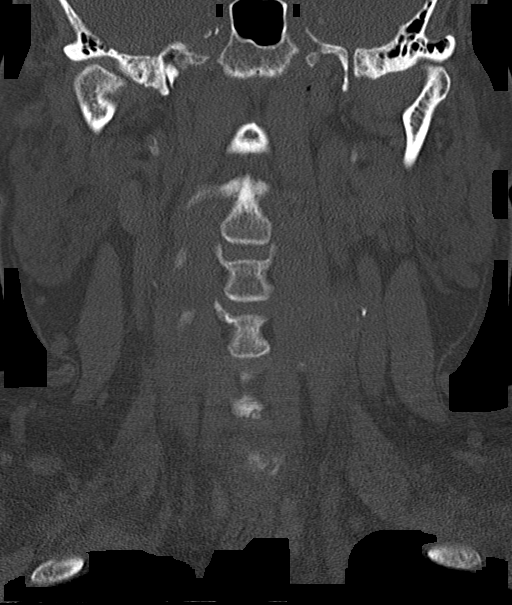
[im 26/64  bone]
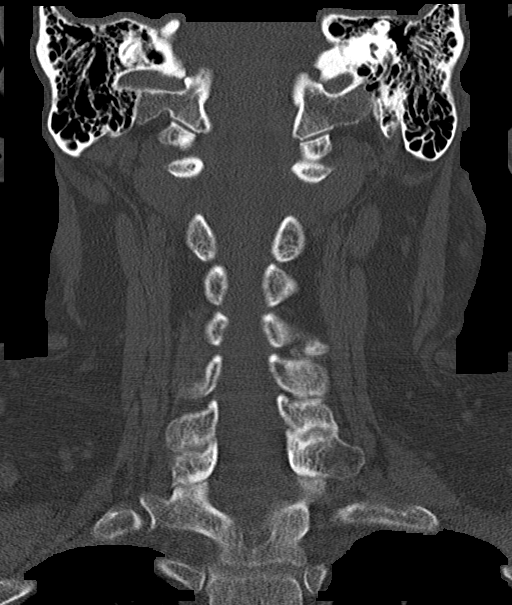
[im 38/64  bone]
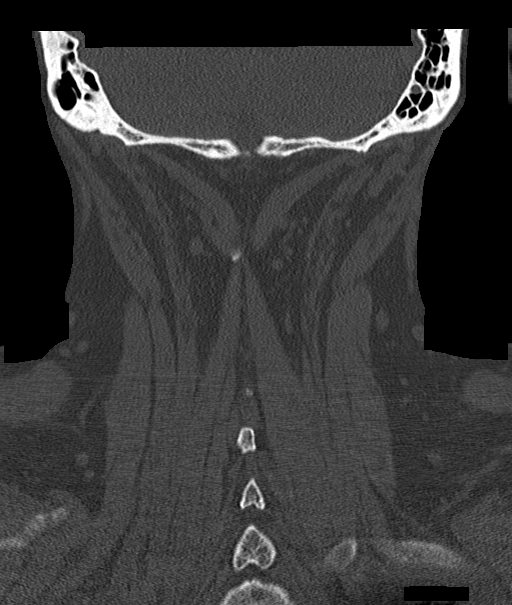

[Series 6: orthogonal bone · axial · 0.29mm/px · z∈[-348,-222]mm · 5 of 102 slices shown, 7 images]
[im 17/102  soft-tissue]
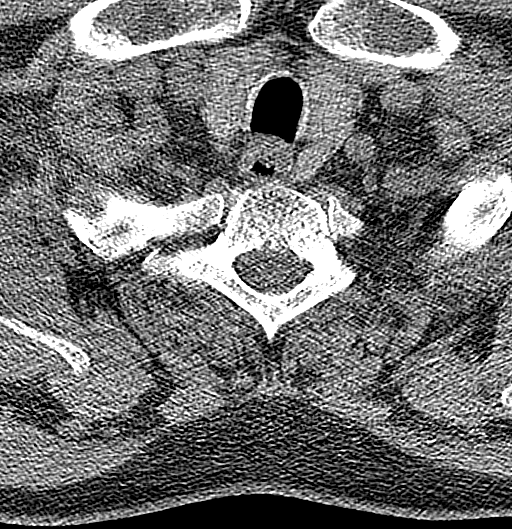
[im 17/102  bone]
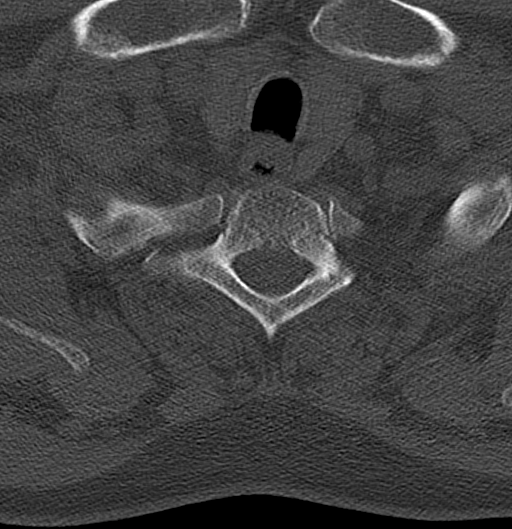
[im 34/102  bone]
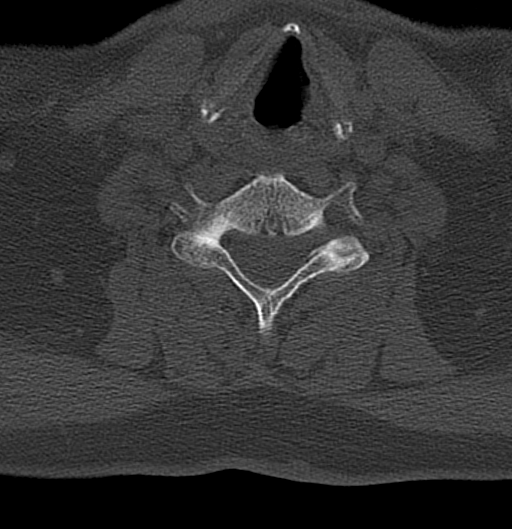
[im 51/102  bone]
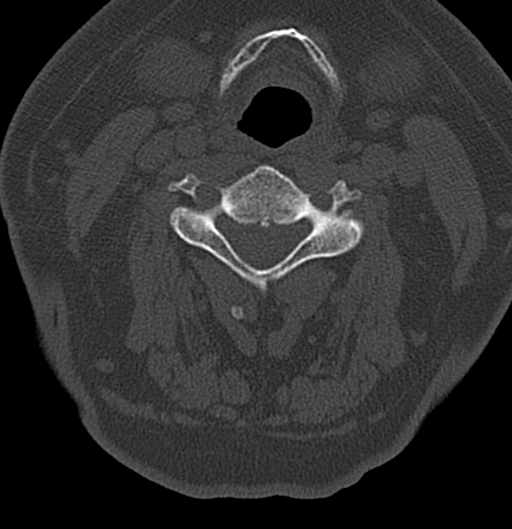
[im 68/102  bone]
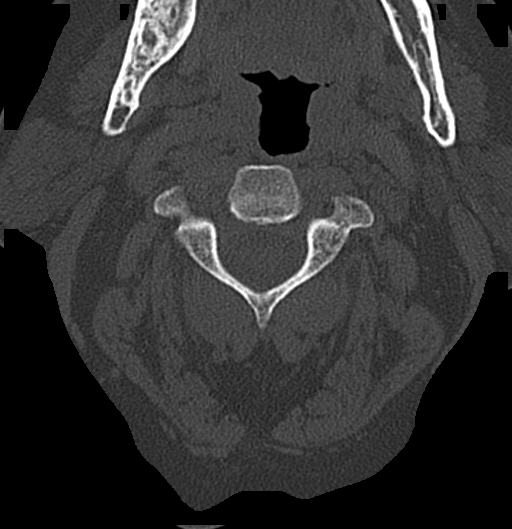
[im 85/102  soft-tissue]
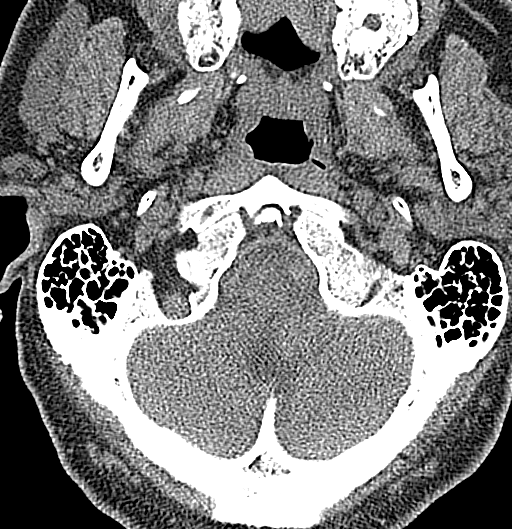
[im 85/102  bone]
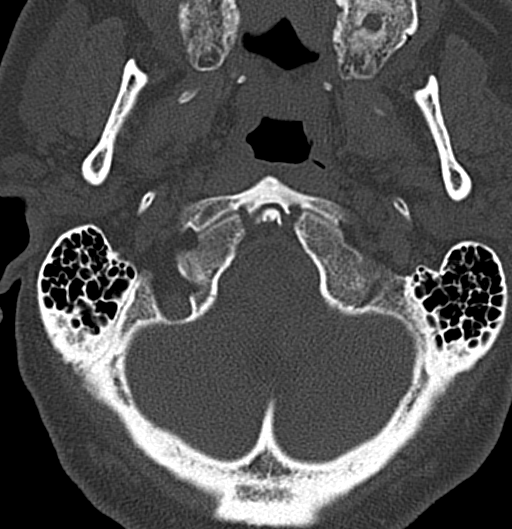

[13 of 33 positions shown; findings below may reference images not displayed]

FINDINGS: Alignment: Normal.

Skull base and vertebrae: No acute fracture. No primary bone lesion
or focal pathologic process.

Soft tissues and spinal canal: No prevertebral fluid or swelling. No
visible canal hematoma.

Disc levels: Mild endplate sclerosis and mild anterior osteophyte
formation is seen at the levels of C3-C4, C5-C6 and C6-C7.

There is mild intervertebral disc space narrowing at the level of
C5-C6. Moderate severity intervertebral disc space narrowing is seen
at the level of C6-C7.

Normal bilateral multilevel facet joints are noted.

Upper chest: Negative.

Other: None.
IMPRESSION: 1. Degenerative changes at the levels of C3-C4, C5-C6 and C6-C7.
2. No acute cervical spine fracture.

## 2021-10-25 IMAGING — CT CT HEAD W/O CM
3 series · 15 of 47 positions shown, 18 images · non-contrast
Comparison: January 17, 2021

CLINICAL DATA: Status post fall.

EXAM:
CT HEAD WITHOUT CONTRAST
TECHNIQUE: Contiguous axial images were obtained from the base of the skull
through the vertex without intravenous contrast.

[Series 2: head wo · axial · 0.49mm/px · z∈[-164,-24]mm · 9 of 34 slices shown, 12 images]
[im 3/34  brain]
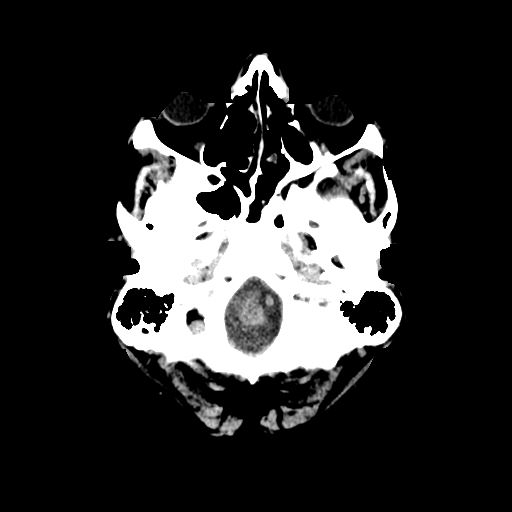
[im 3/34  bone]
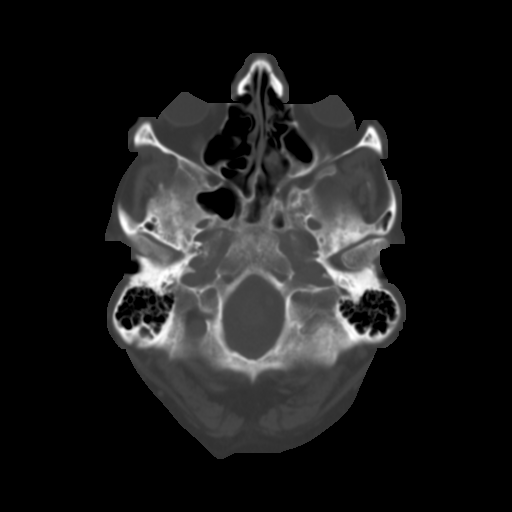
[im 6/34  brain]
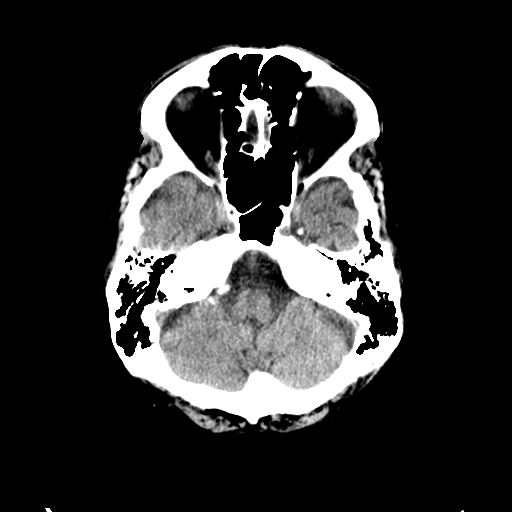
[im 10/34  brain]
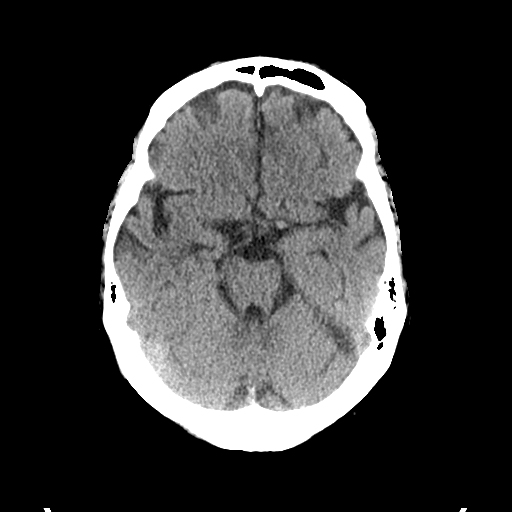
[im 13/34  brain]
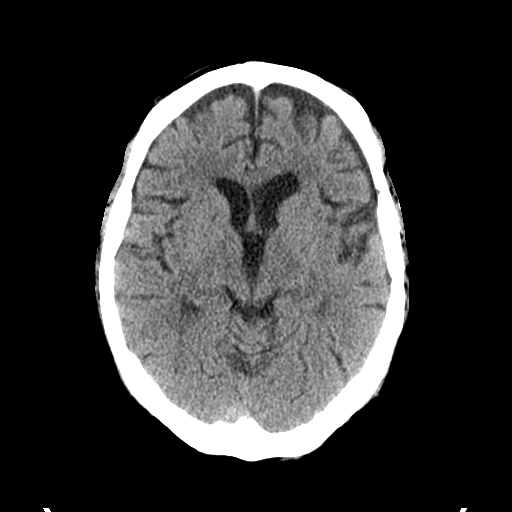
[im 18/34  brain]
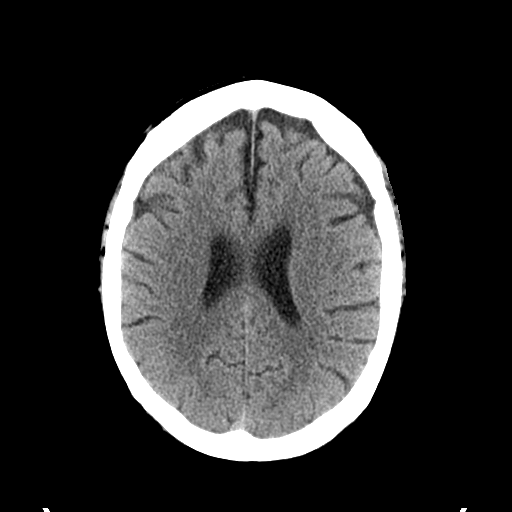
[im 18/34  bone]
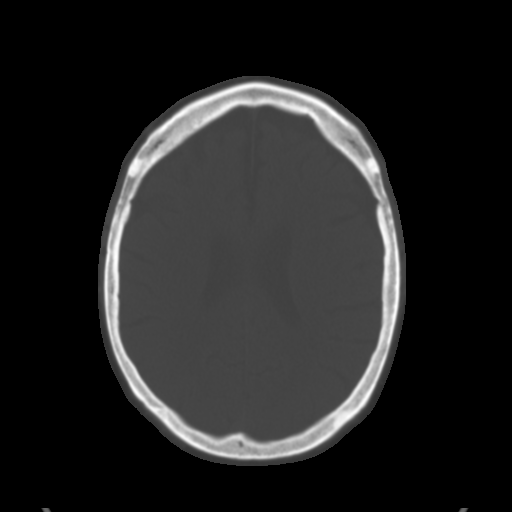
[im 21/34  brain]
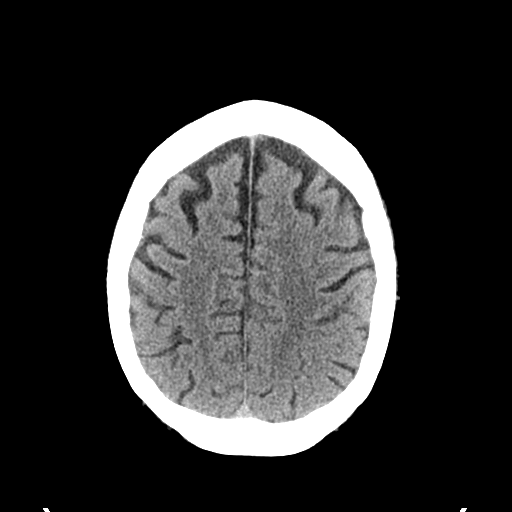
[im 24/34  brain]
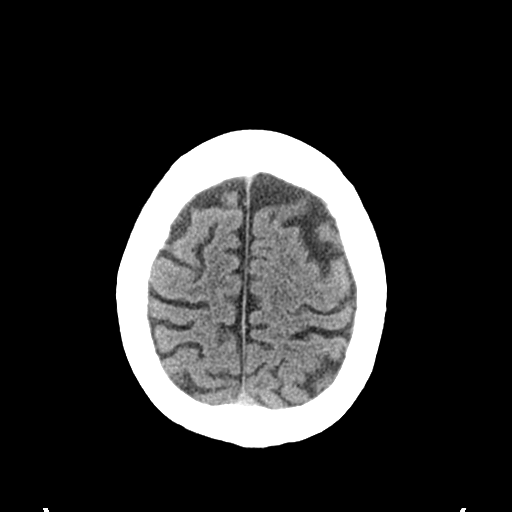
[im 28/34  brain]
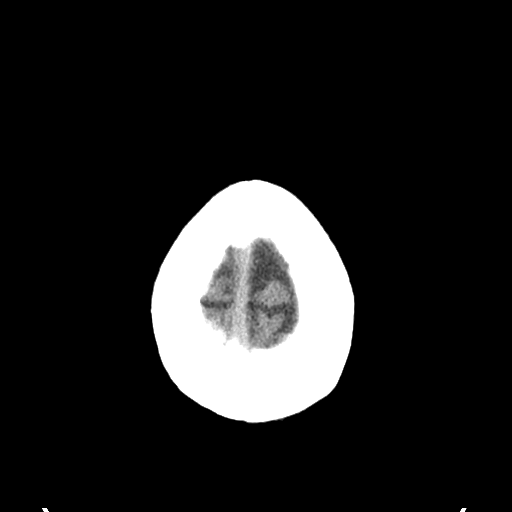
[im 31/34  brain]
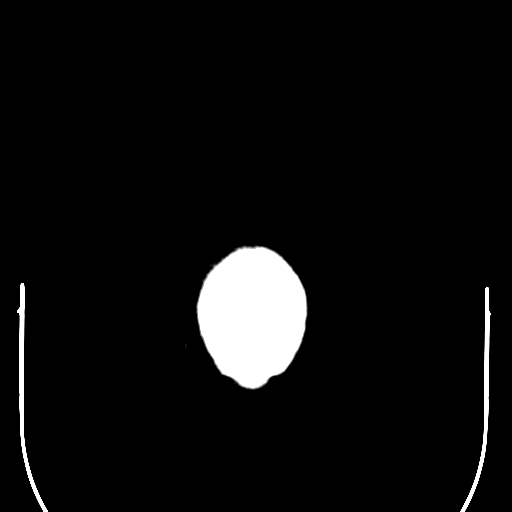
[im 31/34  bone]
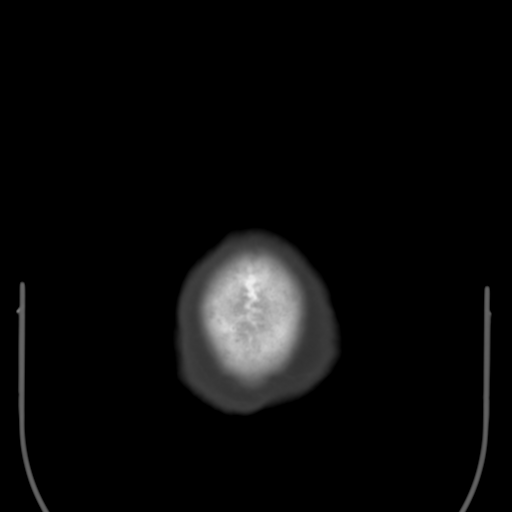

[Series 4: coronal soft tissue · coronal · 0.33mm/px · 3 of 70 slices shown]
[im 24/70  brain]
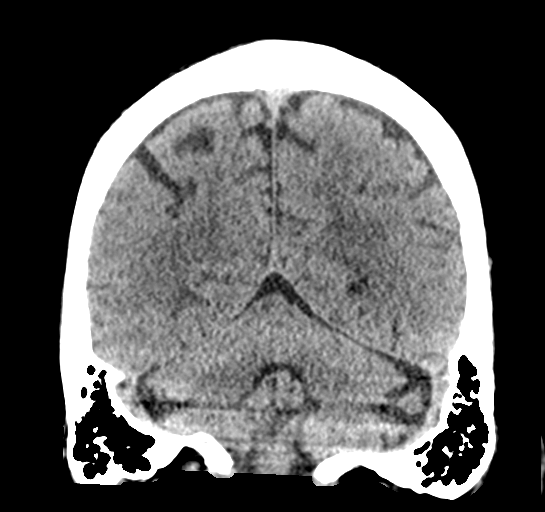
[im 31/70  brain]
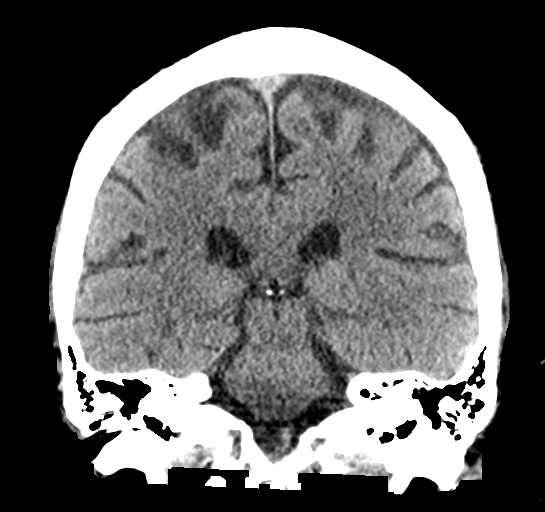
[im 39/70  brain]
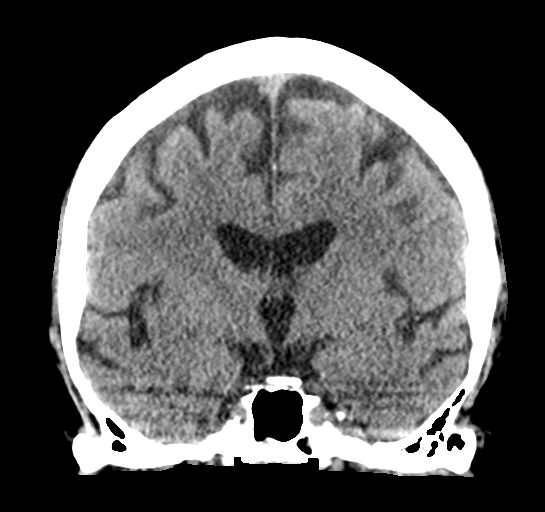

[Series 5: sagittal soft tissue · sagittal · 0.33mm/px · 3 of 56 slices shown]
[im 19/56  brain]
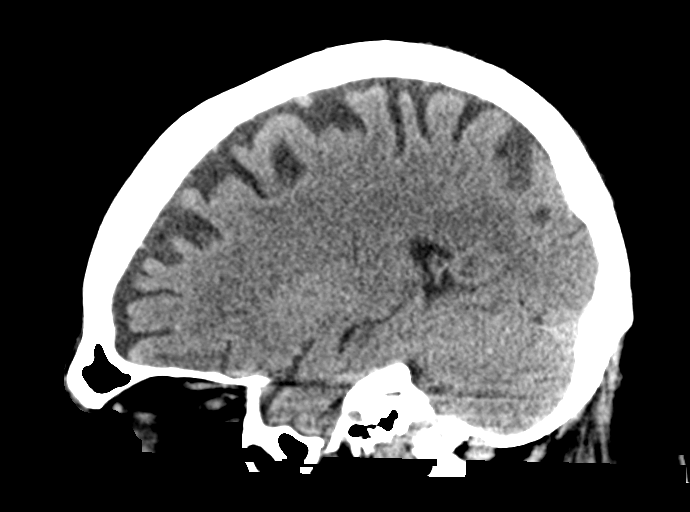
[im 28/56  brain]
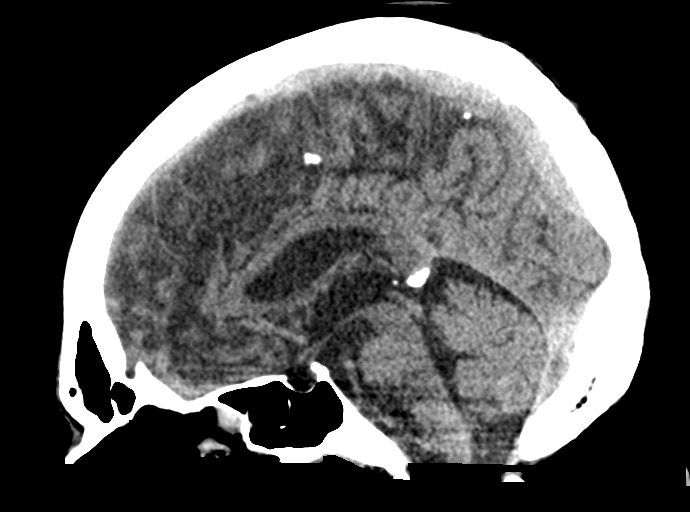
[im 37/56  brain]
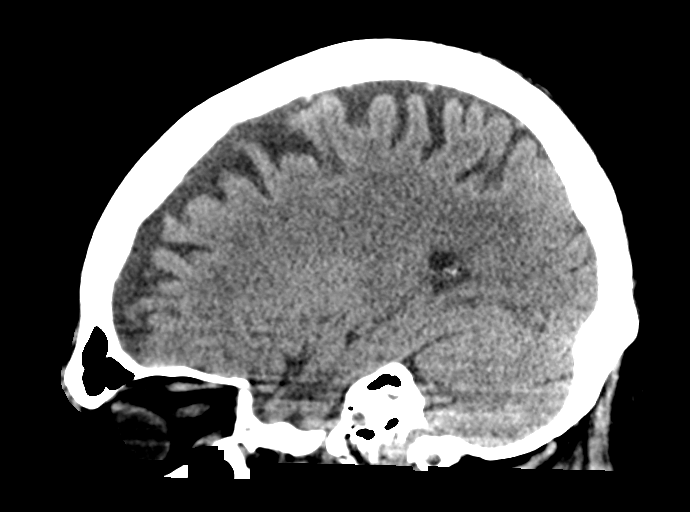

[15 of 47 positions shown; findings below may reference images not displayed]

FINDINGS: Brain: There is mild cerebral atrophy with widening of the
extra-axial spaces and ventricular dilatation.
There are areas of decreased attenuation within the white matter
tracts of the supratentorial brain, consistent with microvascular
disease changes.

Vascular: No hyperdense vessel or unexpected calcification.

Skull: Normal. Negative for fracture or focal lesion.

Sinuses/Orbits: There is mild bilateral ethmoid sinus mucosal
thickening.

Other: None.
IMPRESSION: 1. Mild cerebral atrophy.
2. No acute intracranial abnormality.

## 2021-11-03 ENCOUNTER — Other Ambulatory Visit: Payer: Self-pay

## 2021-11-21 ENCOUNTER — Encounter: Payer: Self-pay | Admitting: Emergency Medicine

## 2021-11-21 DIAGNOSIS — R109 Unspecified abdominal pain: Secondary | ICD-10-CM | POA: Insufficient documentation

## 2021-11-21 DIAGNOSIS — F419 Anxiety disorder, unspecified: Secondary | ICD-10-CM | POA: Insufficient documentation

## 2021-11-21 LAB — COMPREHENSIVE METABOLIC PANEL
ALT: 24 U/L (ref 0–44)
AST: 20 U/L (ref 15–41)
Albumin: 4.5 g/dL (ref 3.5–5.0)
Alkaline Phosphatase: 67 U/L (ref 38–126)
Anion gap: 7 (ref 5–15)
BUN: 12 mg/dL (ref 8–23)
CO2: 26 mmol/L (ref 22–32)
Calcium: 9.4 mg/dL (ref 8.9–10.3)
Chloride: 103 mmol/L (ref 98–111)
Creatinine, Ser: 1.17 mg/dL (ref 0.61–1.24)
GFR, Estimated: >60 mL/min (ref >60)
Glucose, Bld: 99 mg/dL (ref 70–99)
Potassium: 4.1 mmol/L (ref 3.5–5.1)
Sodium: 136 mmol/L (ref 135–145)
Total Bilirubin: 1.6 mg/dL — ABNORMAL HIGH (ref 0.3–1.2)
Total Protein: 7.6 g/dL (ref 6.5–8.1)

## 2021-11-21 LAB — CBC
HCT: 42.9 % (ref 39.0–52.0)
Hemoglobin: 13.8 g/dL (ref 13.0–17.0)
MCH: 27.4 pg (ref 26.0–34.0)
MCHC: 32.2 g/dL (ref 30.0–36.0)
MCV: 85.3 fL (ref 80.0–100.0)
Platelets: 230 10*3/uL (ref 150–400)
RBC: 5.03 MIL/uL (ref 4.22–5.81)
RDW: 16.8 % — ABNORMAL HIGH (ref 11.5–15.5)
WBC: 8.2 10*3/uL (ref 4.0–10.5)
nRBC: 0 % (ref 0.0–0.2)

## 2021-11-21 LAB — URINALYSIS, ROUTINE W REFLEX MICROSCOPIC
Bacteria, UA: NONE SEEN
Bilirubin Urine: NEGATIVE
Glucose, UA: NEGATIVE mg/dL
Hgb urine dipstick: NEGATIVE
Ketones, ur: 5 mg/dL — AB
Leukocytes,Ua: NEGATIVE
Nitrite: NEGATIVE
Protein, ur: 30 mg/dL — AB
Specific Gravity, Urine: 1.021 (ref 1.005–1.030)
pH: 5 (ref 5.0–8.0)

## 2021-11-21 LAB — LIPASE, BLOOD: Lipase: 27 U/L (ref 11–51)

## 2021-11-21 NOTE — ED Triage Notes (Signed)
C/O abdominal pain, right side pain x 3 days.  Also c/o difficulty sleeping x 6 days.  States last drink was 3-4 weeks ago.

## 2021-11-22 ENCOUNTER — Emergency Department
Admission: EM | Admit: 2021-11-22 | Discharge: 2021-11-22 | Disposition: A | Discharge status: Home / Self Care | Payer: Medicaid Other | Attending: Emergency Medicine | Admitting: Emergency Medicine

## 2021-11-22 DIAGNOSIS — F419 Anxiety disorder, unspecified: Secondary | ICD-10-CM

## 2021-11-22 MED ORDER — HYDROXYZINE HCL 25 MG PO TABS
25.0000 mg | ORAL_TABLET | Freq: Three times a day (TID) | ORAL | Status: DC | PRN
  Administered 2021-11-22: 25 mg via ORAL
  Filled 2021-11-22: qty 1

## 2021-11-22 MED ORDER — HYDROXYZINE HCL 10 MG PO TABS: 10.0000 mg | ORAL_TABLET | Freq: Three times a day (TID) | ORAL | 0 refills | Status: DC | PRN

## 2021-11-22 NOTE — ED Provider Notes (Signed)
[facility] Provider Note    Event Date/Time   First MD Initiated Contact with Patient 11/22/21 0022     (approximate)   History   Abdominal Pain   HPI  Miguel [name] is a 61 y.o. male well-known to the emergency department with extensive chronic medical conditions most notably including chronic alcohol abuse, frequent abdominal pain, and anxiety as well as major depressive disorder.  He presents to the ED tonight by EMS for evaluation of anxiety and abdominal pain.  He said the abdominal pain is better now.  He said that he has not had any alcohol for about 3 weeks.  However he said that he continues to have issues sleeping at night and sometimes he gets really worried and starts to shake.  He is not sure if he is still withdrawing or not.  He said he is having quite a bit of stress recently because his wife, who has MS, was recently placed in a rehab facility and he is concerned she is not getting what she needs.  He has been having trouble sleeping although he has been trying to care for himself otherwise.  He denies chest pain or shortness of breath.  No nausea nor vomiting.  No hallucinations.  No seizures.     Physical Exam   Triage Vital Signs: ED Triage Vitals  Enc Vitals Group     BP 11/21/21 1403 (!) 155/93     Pulse Rate 11/21/21 1402 (!) 116     Resp 11/21/21 1402 16     Temp 11/21/21 1402 98.1 F (36.7 C)     Temp src --      SpO2 11/21/21 1402 100 %     Weight 11/21/21 1403 86 kg (189 lb 9.5 oz)     Height 11/21/21 1403 1.829 m (6')     Head Circumference --      Peak Flow --      Pain Score 11/21/21 1403 8     Pain Loc --      Pain Edu? --      Excl. in [place]? --     Most recent vital signs: Vitals:   11/22/21 0022 11/22/21 0108  BP:  (!) 149/88  Pulse: (!) 104 89  Resp:  18  Temp:    SpO2: 96% 98%     General: Awake, no distress.  Well groomed at this time. CV:  Good peripheral perfusion.  Resp:  Normal effort.  Abd:  No  distention.  No tenderness to palpation in any quadrant; the patient continues to talk extensively without any indication of pain throughout deep palpation. Other:  No focal neurological deficits.  No asterixis.  No tremor.   ED Results / Procedures / Treatments   Labs (all labs ordered are listed, but only abnormal results are displayed) Labs Reviewed  COMPREHENSIVE METABOLIC PANEL - Abnormal; Notable for the following components:      Result Value   Total Bilirubin 1.6 (*)    All other components within normal limits  CBC - Abnormal; Notable for the following components:   RDW 16.8 (*)    All other components within normal limits  URINALYSIS, ROUTINE W REFLEX MICROSCOPIC - Abnormal; Notable for the following components:   Color, Urine AMBER (*)    APPearance CLOUDY (*)    Ketones, ur 5 (*)    Protein, ur 30 (*)    All other components within normal limits  LIPASE, BLOOD  EKG  ED ECG REPORT I, Hinda Kehr, the attending physician, personally viewed and interpreted this ECG.  Date: 11/21/2021 EKG Time: 14: 06 Rate: 113 Rhythm: Sinus tachycardia QRS Axis: normal Intervals: normal ST/T Wave abnormalities: Non-specific ST segment / T-wave changes, but no clear evidence of acute ischemia. Narrative Interpretation: no definitive evidence of acute ischemia; does not meet STEMI criteria.   MEDICATIONS ORDERED IN ED: Medications  hydrOXYzine (ATARAX) tablet 25 mg (25 mg Oral Given 11/22/21 0053)     IMPRESSION / MDM / ASSESSMENT AND PLAN / ED COURSE  I reviewed the triage vital signs and the nursing notes.                              Differential diagnosis includes, but is not limited to, anxiety, alcohol withdrawal, acute intra-abdominal infection, electrolyte or metabolic abnormality.  Patient is well-known to me in the emergency department.  He actually looks quite well compared to some of his visits.  He is well-groomed and I believe him when he says he has not  been drinking alcohol recently.  He is very slightly tachycardic but I believe that is due to his anxiety and his tachycardia resolved while he was in his room.  His heart rate when I saw him was in the 80s.  He has no tremor and no evidence of alcohol withdrawal.  He is under a lot of stress and is always an anxious person at baseline.  As part of his evaluation his orders included an EKG, CBC, lipase, comprehensive metabolic, and urinalysis.  I reviewed all of the studies and there is no evidence of ischemia on EKG, just the resolved tachycardia.  His CBC is within normal limits.  CMP is within normal limits other than a very slightly elevated total bilirubin, and urinalysis is unremarkable.  I provided reassurance and encouragement to the patient for his good progress in terms of staying away from alcohol.  I explained there is no evidence of alcohol withdrawal at this time and that his symptoms are most likely due to anxiety.  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    I considered whether or not he would benefit from admission, but given no abdominal tenderness to palpation, reassuring labs, nonischemic EKG, and stable vital signs, I believe he can be followed up as an outpatient without difficulty or significant danger to the patient.  He understands and agrees with the plan.  I gave my usual and customary return precautions.         FINAL CLINICAL IMPRESSION(S) / ED DIAGNOSES   Final diagnoses:  Anxiety     Rx / DC Orders   ED Discharge Orders          Ordered    hydrOXYzine (ATARAX) 10 MG tablet  3 times daily PRN        11/22/21 0047             Note:  This document was prepared using Dragon voice recognition software and may include  unintentional dictation errors.   Hinda Kehr, MD 11/22/21 [phone]

## 2021-11-22 NOTE — ED Notes (Signed)
Pt complains of generalized abd pain and feeling weak. Pt states he has not had an alcoholic drink in 3 weeks.

## 2021-11-24 ENCOUNTER — Other Ambulatory Visit: Payer: Self-pay

## 2021-11-26 ENCOUNTER — Other Ambulatory Visit: Payer: Self-pay

## 2021-11-27 ENCOUNTER — Other Ambulatory Visit: Payer: Self-pay

## 2021-12-04 IMAGING — CR DG CHEST 2V
2 series · 2 of 2 positions shown · non-contrast
Comparison: 02/10/2021

CLINICAL DATA: Fell.  Left-sided chest pain.

EXAM:
CHEST - 2 VIEW

[chest lat]
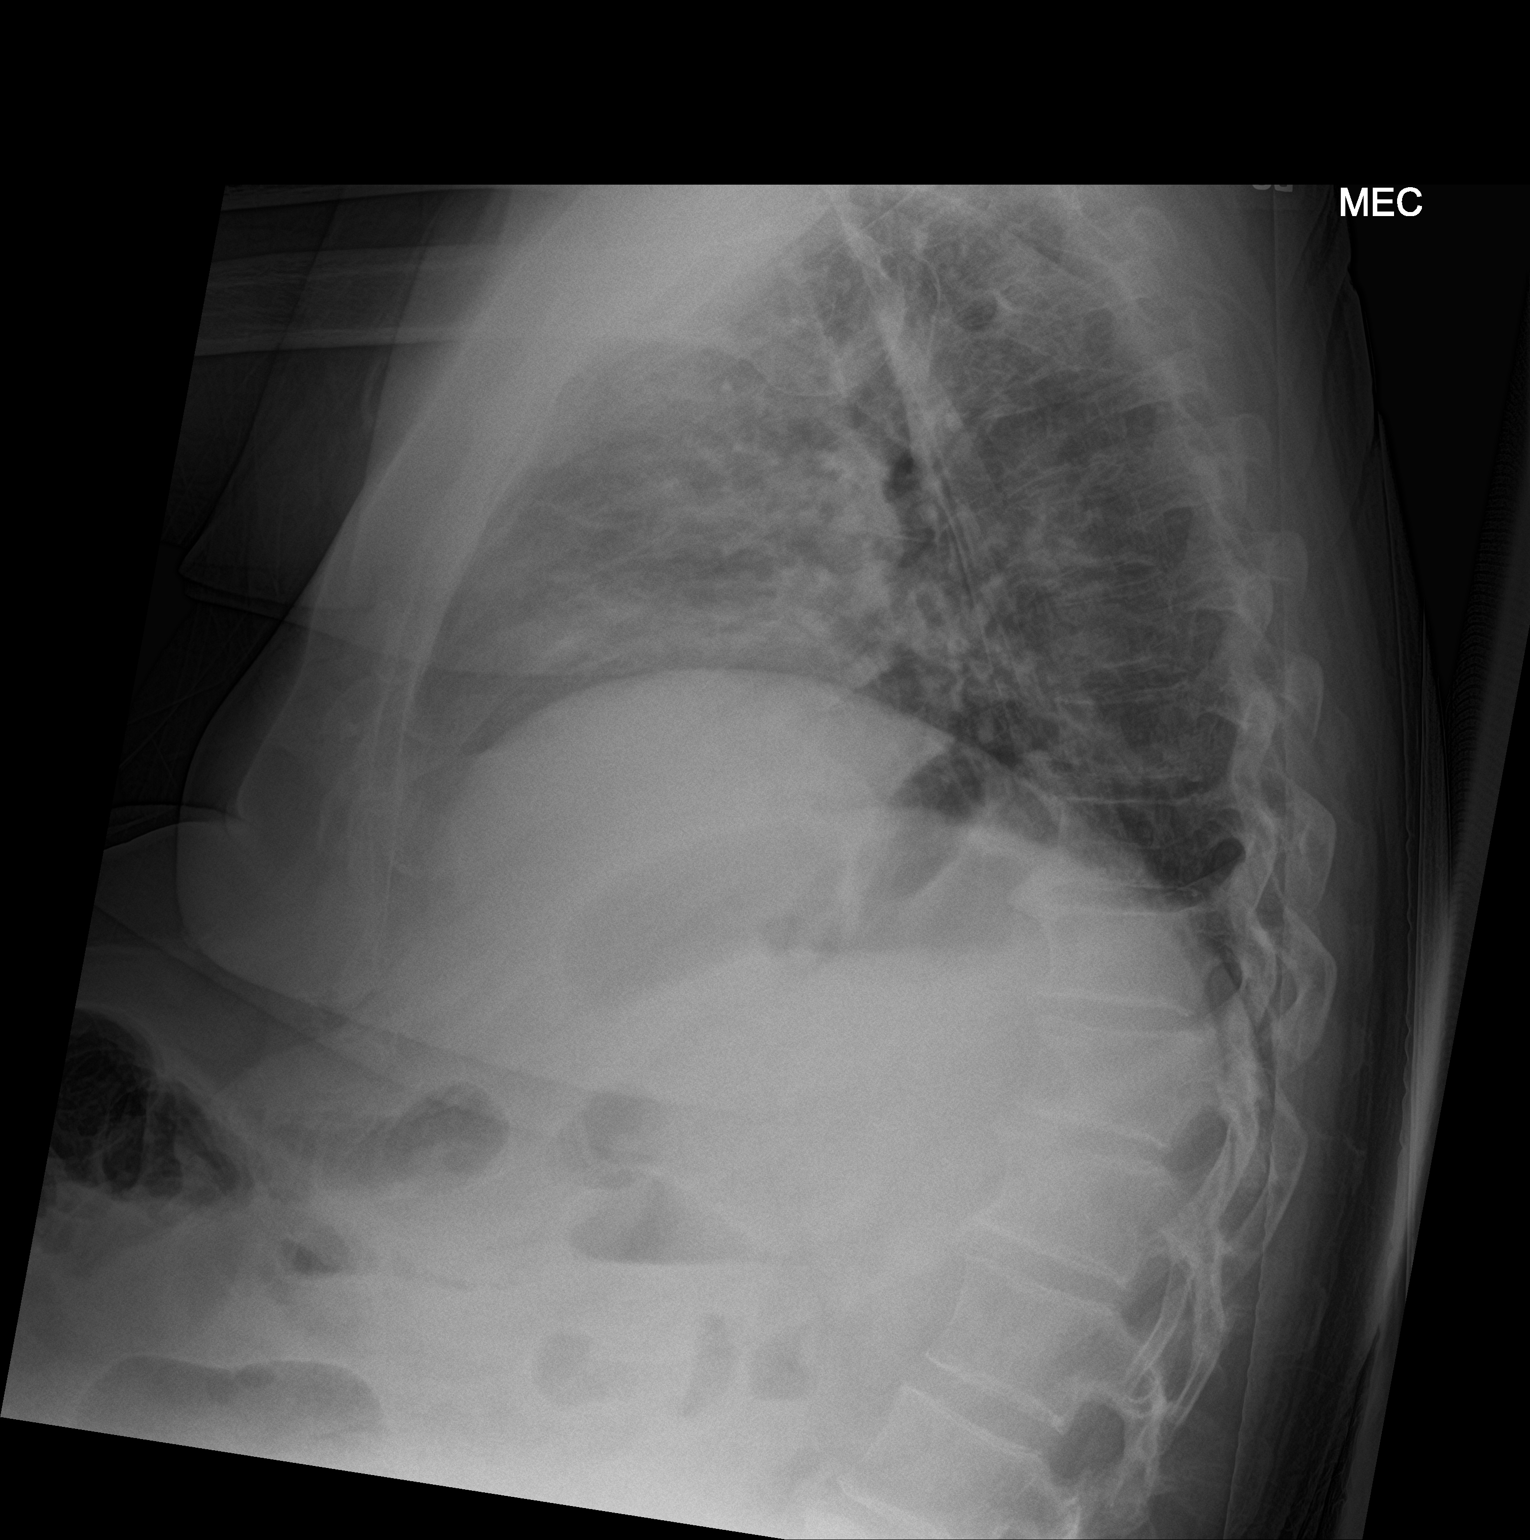

[chest ap]
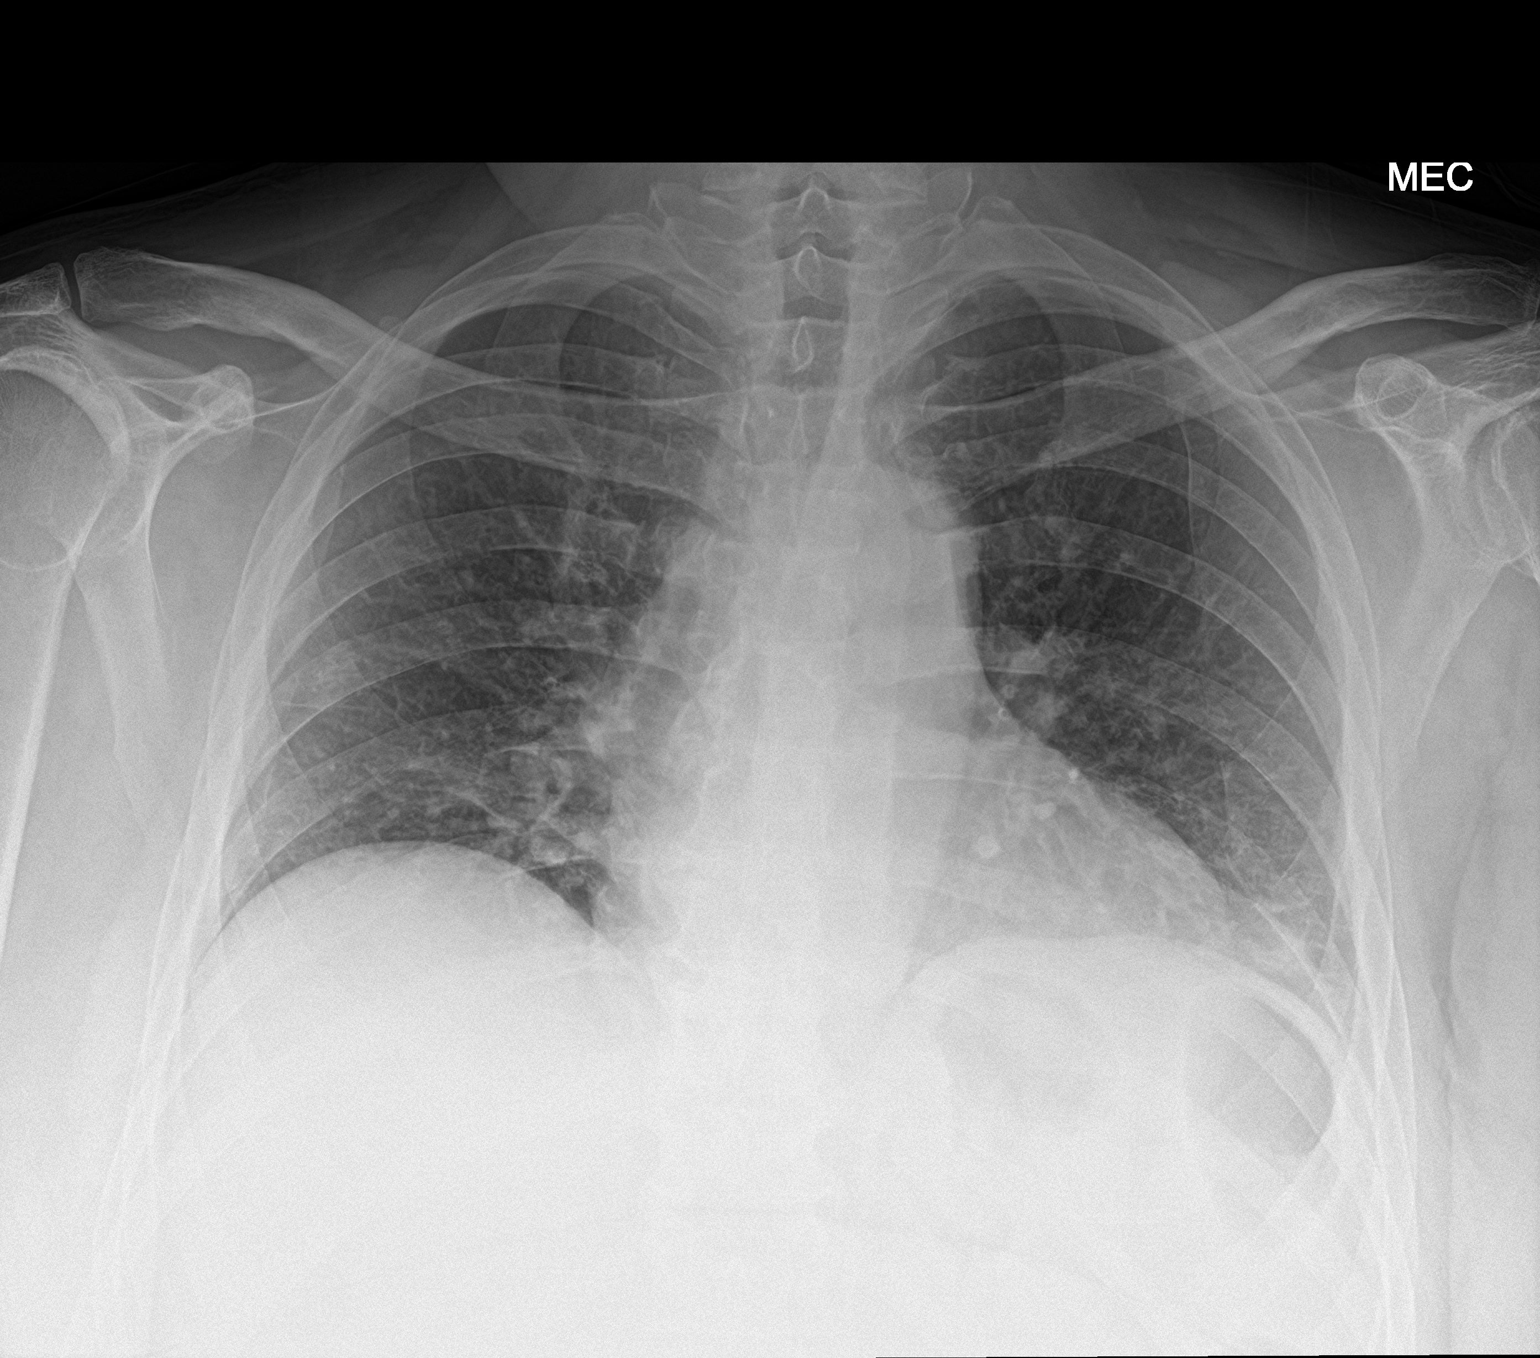

[2 of 2 positions shown; findings below may reference images not displayed]

FINDINGS: The cardiac silhouette, mediastinal and hilar contours are within
normal limits and stable.

Low lung volumes with streaky bibasilar atelectasis, left greater
than right. No pleural effusion or pneumothorax.

No definite acute rib fractures. The thoracic and upper lumbar
vertebral bodies are normally aligned. No acute fracture.
IMPRESSION: Low lung volumes with streaky bibasilar atelectasis.

## 2021-12-11 ENCOUNTER — Ambulatory Visit: Payer: Medicaid Other | Admitting: Gerontology

## 2021-12-11 ENCOUNTER — Encounter: Payer: Self-pay | Admitting: Gerontology

## 2021-12-11 ENCOUNTER — Other Ambulatory Visit: Payer: Self-pay

## 2021-12-11 ENCOUNTER — Other Ambulatory Visit: Payer: Self-pay | Admitting: Gerontology

## 2021-12-11 VITALS — BP 161/94 | HR 89 | Temp 97.9°F | Resp 16 | Ht 72.0 in | Wt 195.8 lb

## 2021-12-11 DIAGNOSIS — I10 Essential (primary) hypertension: Secondary | ICD-10-CM

## 2021-12-11 DIAGNOSIS — F411 Generalized anxiety disorder: Secondary | ICD-10-CM

## 2021-12-11 DIAGNOSIS — R0981 Nasal congestion: Secondary | ICD-10-CM | POA: Insufficient documentation

## 2021-12-11 MED ORDER — METOPROLOL TARTRATE 25 MG PO TABS
25.0000 mg | ORAL_TABLET | Freq: Every day | ORAL | 3 refills | Status: DC
  Filled 2021-12-11: qty 30, 30d supply, fill #0

## 2021-12-11 MED ORDER — LISINOPRIL 40 MG PO TABS
ORAL_TABLET | Freq: Every day | ORAL | 0 refills | Status: DC
  Filled 2021-12-11: qty 90, fill #0

## 2021-12-11 MED ORDER — FLUTICASONE PROPIONATE 50 MCG/ACT NA SUSP
2.0000 | Freq: Every day | NASAL | 0 refills | Status: DC
  Filled 2021-12-11: qty 16, fill #0

## 2021-12-11 MED ORDER — METOPROLOL TARTRATE 25 MG PO TABS
25.0000 mg | ORAL_TABLET | Freq: Every day | ORAL | 3 refills | Status: DC
  Filled 2021-12-11: qty 30, 30d supply, fill #0
  Filled 2022-01-26: qty 60, 60d supply, fill #1

## 2021-12-11 MED ORDER — HYDROXYZINE HCL 10 MG PO TABS
10.0000 mg | ORAL_TABLET | Freq: Three times a day (TID) | ORAL | 0 refills | Status: DC | PRN
  Filled 2021-12-11: qty 30, 10d supply, fill #0

## 2021-12-11 MED ORDER — FLUTICASONE PROPIONATE 50 MCG/ACT NA SUSP
2.0000 | Freq: Every day | NASAL | 0 refills | Status: DC
  Filled 2021-12-11: qty 16, 30d supply, fill #0

## 2021-12-11 MED ORDER — HYDROXYZINE HCL 10 MG PO TABS: 10.0000 mg | ORAL_TABLET | Freq: Three times a day (TID) | ORAL | 0 refills | Status: DC | PRN

## 2021-12-11 MED ORDER — LISINOPRIL 40 MG PO TABS
ORAL_TABLET | Freq: Every day | ORAL | 0 refills | Status: DC
  Filled 2021-12-11: qty 30, 30d supply, fill #0
  Filled 2022-01-26: qty 60, 60d supply, fill #1

## 2021-12-11 NOTE — Patient Instructions (Signed)

## 2021-12-11 NOTE — Progress Notes (Signed)
Established Patient Office Visit  Subjective:  Patient ID: Miguel [name], male    DOB: October 19, 1961  Age: 61 y.o. MRN: [id number]  CC:  Chief Complaint  Patient presents with   Follow-up   Hypertension    HPI DEMITRI KUCINSKI   is a 61 y/o male who has history of Alcohol abuse, Anxiety, Asthma, GERD, Gout, Hypertension, Renal calculi presents for routine follow up visit. His blood pressure was elevated during visit, states that he's been out for 1 month. He denies chest pain, palpitation, headache and vision changes. He also states that he's anxious, and has not had any alcoholic beverage since 72/62/03. He states that he's very anxious, his wife is in a Rehab facility in [place]. He states that taking Hydroxyzine relieves his symptoms and he requests a mental health evaluation. He denies suicidal nor homicidal ideation.  He also c/o nasal congestion and uses otc Afrin. He denies sinus pain/pressure, and rhinorrhea.Overall, he states that he's working hard abstaining from alcohol and offers no further complaint.  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    OCD (obsessive compulsive disorder)    Renal colic     Past Surgical History:  Procedure Laterality Date   CHOLECYSTECTOMY  2012   COLONOSCOPY     COLONOSCOPY WITH PROPOFOL N/A 05/28/2020   Procedure: COLONOSCOPY WITH PROPOFOL;  Surgeon: [name], MD;  Location: [facility] ENDOSCOPY;  Service: Gastroenterology;  Laterality: N/A;   EXTRACORPOREAL SHOCK WAVE LITHOTRIPSY Left 01/12/2019   Procedure: EXTRACORPOREAL SHOCK WAVE LITHOTRIPSY (ESWL);  Surgeon: Billey Co, MD;  Location: [facility] ORS;  Service: Urology;  Laterality: Left;   UPPER GI ENDOSCOPY      Family History  Problem  Relation Age of Onset   Alcohol abuse Father    Breast cancer Mother 60   Anxiety disorder Brother    Other Maternal Grandmother        unknown medical history   Other Maternal Grandfather        unknow medical history   Other Paternal Grandmother        unknown medical history   Other Paternal Grandfather        unknown medical history   Healthy Brother     Social History   Socioeconomic History   Marital status: Married    Spouse name: Not on file   Number of children: Not on file   Years of education: Not on file   Highest education level: Not on file  Occupational History   Not on file  Tobacco Use   Smoking status: Former    Types: Cigarettes    Quit date: 03/19/1981    Years since quitting: 40.7   Smokeless tobacco: Never  Vaping Use   Vaping Use: Never used  Substance and Sexual Activity   Alcohol use: Not Currently    Alcohol/week: 24.0 standard drinks    Types: 24 Shots of liquor per week    Comment: last used 09/2021   Drug use: Not Currently    Types: Marijuana    Comment: last use 09/2021   Sexual activity: Not Currently  Other Topics  Concern   Not on file  Social History Narrative   Lives at home with his wife and takes care of his wife. Independent at baseline      - Biggest strain is financial but doesn't think they could got get more help.      Patient expressed interest in possible financial assistance. Informed written consent obtained   Social Determinants of Health   Financial Resource Strain: Not on file  Food Insecurity: No Food Insecurity   Worried About Charity fundraiser in the Last Year: Never true   Ran Out of Food in the Last Year: Never true  Transportation Needs: No Transportation Needs   Lack of Transportation (Medical): No   Lack of Transportation (Non-Medical): No  Physical Activity: Not on file  Stress: Not on file  Social Connections: Not on file  Intimate Partner Violence: Not on file    Outpatient Medications Prior  to Visit  Medication Sig Dispense Refill   ADVAIR DISKUS 100-50 MCG/ACT AEPB INHALE 1 PUFF 2 TIMES A DAY. RINSE MOUTH AND SPIT AFTER EACH USE. 180 each 3   albuterol (VENTOLIN HFA) 108 (90 Base) MCG/ACT inhaler Inhale 2 puffs into the lungs once every 4 (four) hours as needed. 34 g 0   ascorbic acid (VITAMIN C) 500 MG tablet Take by mouth.     folic acid (FOLVITE) 1 MG tablet TAKE ONE TABLET BY MOUTH EVERY DAY 30 tablet 1   thiamine (VITAMIN B-1) 100 MG tablet TAKE ONE TABLET BY MOUTH ONCE EVERY DAY. 90 tablet 0   hydrOXYzine (ATARAX) 10 MG tablet Take 1 tablet (10 mg total) by mouth 3 (three) times daily as needed for anxiety. 30 tablet 0   ibuprofen (ADVIL) 800 MG tablet Take 1 tablet (800 mg total) by mouth every 8 (eight) hours as needed. 30 tablet 0   loratadine (CLARITIN) 10 MG tablet Take 10 mg by mouth daily.     ondansetron (ZOFRAN ODT) 4 MG disintegrating tablet Take 1 tablet (4 mg total) by mouth every 8 (eight) hours as needed. 20 tablet 0   oxymetazoline (AFRIN) 0.05 % nasal spray Place 2 sprays into both nostrils 2 (two) times daily. 15 mL 2   allopurinol (ZYLOPRIM) 300 MG tablet TAKE ONE TABLET BY MOUTH EVERY DAY 90 tablet 1   mirtazapine (REMERON) 15 MG tablet Take 1 tablet (15 mg total) by mouth at bedtime. 30 tablet 0   pantoprazole (PROTONIX) 40 MG tablet Take 1 tablet (40 mg total) by mouth once daily. (Patient not taking: Reported on 12/11/2021) 30 tablet 11   hydrOXYzine (VISTARIL) 100 MG capsule Take 1 capsule (100 mg total) by mouth 3 (three) times daily as needed (congestion). 15 capsule 0   lisinopril (ZESTRIL) 40 MG tablet TAKE ONE TABLET BY MOUTH EVERY DAY (Patient not taking: Reported on 12/11/2021) 90 tablet 1   metoprolol tartrate (LOPRESSOR) 25 MG tablet Take 1 tablet (25 mg total) by mouth once daily. (Patient not taking: Reported on 12/11/2021) 30 tablet 3   No facility-administered medications prior to visit.    No Known Allergies  ROS Review of Systems   Constitutional: Negative.   HENT:  Positive for congestion. Negative for postnasal drip, rhinorrhea, sinus pressure, sinus pain, sneezing and sore throat.   Respiratory: Negative.    Cardiovascular: Negative.   Skin: Negative.   Neurological: Negative.   Psychiatric/Behavioral:  The patient is nervous/anxious.      Objective:    Physical Exam HENT:  Head: Normocephalic and atraumatic.     Nose: Nose normal. No nasal deformity or septal deviation.     Right Turbinates: Not enlarged.     Left Turbinates: Not enlarged.     Right Sinus: No maxillary sinus tenderness or frontal sinus tenderness.     Left Sinus: No maxillary sinus tenderness or frontal sinus tenderness.  Cardiovascular:     Rate and Rhythm: Normal rate and regular rhythm.     Pulses: Normal pulses.     Heart sounds: Normal heart sounds.  Pulmonary:     Effort: Pulmonary effort is normal.     Breath sounds: Normal breath sounds.  Neurological:     General: No focal deficit present.     Mental Status: He is alert and oriented to person, place, and time. Mental status is at baseline.  Psychiatric:        Mood and Affect: Mood normal.        Behavior: Behavior normal.        Thought Content: Thought content normal.        Judgment: Judgment normal.    BP (!) 161/94 (BP Location: Right Arm, Patient Position: Sitting, Cuff Size: Large)    Pulse 89    Temp 97.9 F (36.6 C) (Oral)    Resp 16    Ht 6' (1.829 m)    Wt 195 lb 12.8 oz (88.8 kg)    SpO2 97%    BMI 26.56 kg/m  Wt Readings from Last 3 Encounters:  12/11/21 195 lb 12.8 oz (88.8 kg)  11/21/21 189 lb 9.5 oz (86 kg)  10/11/21 189 lb 9.5 oz (86 kg)     Health Maintenance Due  Topic Date Due   COVID-19 Vaccine (1) Never done   Hepatitis C Screening  Never done   Zoster Vaccines- Shingrix (1 of 2) Never done   COLONOSCOPY (Pts 45-48yr Insurance coverage will need to be confirmed)  05/28/2021    There are no preventive care reminders to display for  this patient.  Lab Results  Component Value Date   TSH 0.052 (L) 11/29/2020   Lab Results  Component Value Date   WBC 8.2 11/21/2021   HGB 13.8 11/21/2021   HCT 42.9 11/21/2021   MCV 85.3 11/21/2021   PLT 230 11/21/2021   Lab Results  Component Value Date   NA 136 11/21/2021   K 4.1 11/21/2021   CO2 26 11/21/2021   GLUCOSE 99 11/21/2021   BUN 12 11/21/2021   CREATININE 1.17 11/21/2021   BILITOT 1.6 (H) 11/21/2021   ALKPHOS 67 11/21/2021   AST 20 11/21/2021   ALT 24 11/21/2021   PROT 7.6 11/21/2021   ALBUMIN 4.5 11/21/2021   CALCIUM 9.4 11/21/2021   ANIONGAP 7 11/21/2021   Lab Results  Component Value Date   CHOL 155 05/22/2020   Lab Results  Component Value Date   HDL 65 05/22/2020   Lab Results  Component Value Date   LDLCALC 73 05/22/2020   Lab Results  Component Value Date   TRIG 92 05/22/2020   Lab Results  Component Value Date   CHOLHDL 2.4 05/22/2020   Lab Results  Component Value Date   HGBA1C 5.2 11/29/2020      Assessment & Plan:    1. Essential hypertension - His blood pressure is not under control , he was encouraged to pick up medication, continue on DASH diet. - lisinopril (ZESTRIL) 40 MG tablet; TAKE ONE TABLET BY MOUTH ONCE EVERY DAY.  Dispense: 90 tablet; Refill: 0 - metoprolol tartrate (LOPRESSOR) 25 MG tablet; Take 1 tablet (25 mg total) by mouth once daily.  Dispense: 30 tablet; Refill: 3  2. Generalized anxiety disorder - He will continue on Hydroxyzine, and will follow up with [facility]. - hydrOXYzine (ATARAX) 10 MG tablet; Take 1 tablet (10 mg total) by mouth 3 (three) times daily as needed for anxiety.  Dispense: 30 tablet; Refill: 0  3. Nasal congestion - He was started on Fluticasone, educated on medication side effect and to notify clinic. He was advised to use humidifier. - fluticasone (FLONASE) 50 MCG/ACT nasal spray; Place 2 sprays into both nostrils once daily.  Dispense: 16 g; Refill: 0     Follow-up:  Return in about 1 month (around 01/13/2022), or if symptoms worsen or fail to improve.    Shaunak Kreis Jerold Coombe, NP

## 2021-12-12 ENCOUNTER — Other Ambulatory Visit: Payer: Self-pay

## 2021-12-15 ENCOUNTER — Other Ambulatory Visit: Payer: Self-pay

## 2021-12-17 ENCOUNTER — Institutional Professional Consult (permissible substitution): Payer: Medicaid Other | Admitting: Licensed Clinical Social Worker

## 2021-12-18 ENCOUNTER — Ambulatory Visit: Payer: Self-pay | Admitting: Licensed Clinical Social Worker

## 2021-12-18 ENCOUNTER — Other Ambulatory Visit: Payer: Self-pay

## 2021-12-18 ENCOUNTER — Ambulatory Visit: Payer: Medicaid Other | Admitting: Licensed Clinical Social Worker

## 2021-12-18 DIAGNOSIS — F411 Generalized anxiety disorder: Secondary | ICD-10-CM

## 2021-12-18 DIAGNOSIS — F1010 Alcohol abuse, uncomplicated: Secondary | ICD-10-CM
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
## 2021-12-23 ENCOUNTER — Other Ambulatory Visit: Payer: Self-pay

## 2021-12-23 ENCOUNTER — Other Ambulatory Visit: Payer: Self-pay | Admitting: Gerontology

## 2021-12-23 DIAGNOSIS — F411 Generalized anxiety disorder: Secondary | ICD-10-CM

## 2021-12-23 MED ORDER — HYDROXYZINE HCL 50 MG PO TABS
50.0000 mg | ORAL_TABLET | Freq: Three times a day (TID) | ORAL | 0 refills | Status: DC | PRN
  Filled 2021-12-23: qty 90, 30d supply, fill #0

## 2021-12-24 ENCOUNTER — Other Ambulatory Visit: Payer: Self-pay

## 2021-12-25 ENCOUNTER — Other Ambulatory Visit: Payer: Self-pay

## 2021-12-25 ENCOUNTER — Ambulatory Visit: Payer: Self-pay | Admitting: Licensed Clinical Social Worker

## 2021-12-25 ENCOUNTER — Ambulatory Visit: Payer: Medicaid Other | Admitting: Licensed Clinical Social Worker

## 2021-12-25 DIAGNOSIS — F1010 Alcohol abuse, uncomplicated: Secondary | ICD-10-CM

## 2021-12-25 DIAGNOSIS — F411 Generalized anxiety disorder: Secondary | ICD-10-CM

## 2021-12-25 NOTE — BH Specialist Note (Signed)
Integrated Behavioral Health via Telemedicine Visit  12/25/2021 AHMAD VANWEY [id number]    Referring Provider: Carlyon Shadow, NP Patient/Family location: The patient's home  [facility] Provider location: [place] All persons participating in visit: Hasaan Radde. [name] and [name], MSW, LCSW-A Types of Service: Telephone visit  I connected with [name] via  Telephone or Video Enabled Telemedicine Application  (Video is Caregility application) and verified that I am speaking with the correct person using two identifiers. Discussed confidentiality: Yes   I discussed the limitations of telemedicine and the availability of in person appointments.  Discussed there is a possibility of technology failure and discussed alternative modes of communication if that failure occurs.  Patient and/or legal guardian expressed understanding and consented to Telemedicine visit: Yes   Presenting Concerns: Patient and/or family reports the following symptoms/concerns: The patient reports that he has been the same since his last appointment. He shared that he is sick today with a sore throat and cold like symptoms which make speaking painful. He explained that he was walking in the cold last week for exercise that helped him to clear his mind. He stated that he gets bored very easily and that is typically a trigger for him to drink so he tries to fill his day with either phone calls to help his wife or things to keep his mind focused. He discussed health and financial stressors that are impacting his life.  Val[place] noted he also drinks 3/4 a gallon of water a day for self care and has experienced an increased appetite. Manpreet agreed to contact [facility] this week. The patient denied any suicidal or homicidal thoughts.  Duration of problem: Years; Severity of problem: severe  Patient and/or Family's Strengths/Protective Factors: Concrete supports in place (healthy food, safe environments, etc.)  Goals  Addressed: Patient will:  Reduce symptoms of: agitation, anxiety, depression, insomnia, mood instability, and stress   Increase knowledge and/or ability of: coping skills, healthy habits, self-management skills, stress reduction, and relapse prevention    Demonstrate ability to: Increase healthy adjustment to current life circumstances, Increase adequate support systems for patient/family, Increase motivation to adhere to plan of care, and Decrease self-medicating behaviors  Progress towards Goals: Ongoing  Interventions: Interventions utilized:  CBT Cognitive Behavioral Therapywas utilized by the clinician during today's follow up session. Clinician met with patient to identify needs related to stressors and functioning, and assess and monitor for signs and symptoms of anxiety and depression, and assess safety. The clinician processed with the patient how they have been doing since the last follow-up session. Clinician measured the patient's anxiety and depression on numerical scale.Clinician utilized guidance and supervision, provided by psychiatric consultant, to inform patient of the benefits and risks of psychotropic medication use including a verbal summary of [place] [facility] warnings.  Clinician further informed the patient the treatment recommendations from the psychiatric case consultation included he attend [place] while  seen by LCSW-A at [facility]. Patient was given the opportunity to ask questions and address concerns regarding the psychiatric consultants recommendations for treatment. Clinician determined that patient was agreeable to the recommendations and the session ended in scheduling.  Standardized Assessments completed: GAD-7 and PHQ 9 GAD-7 = 18 PHQ-9 = 12  Patient and/or Family Response: Ashwin agreed to contact [facility] this week.   Assessment: Patient currently experiencing see above.   Patient may benefit from see above.  Plan:02/14/202 Follow up with behavioral health clinician  on : 12/30/2021 at 4:00 PM  Behavioral recommendations:  Referral(s): [facility] (In Clinic) and [place] (LME/Outside Clinic)  I discussed the assessment and treatment plan with the patient and/or parent/guardian. They were provided an opportunity to ask questions and all were answered. They agreed with the plan and demonstrated an understanding of the instructions.   They were advised to call back or seek an in-person evaluation if the symptoms worsen or if the condition fails to improve as anticipated.  Lesli Albee, LCSWA

## 2021-12-27 ENCOUNTER — Other Ambulatory Visit: Payer: Self-pay

## 2021-12-27 ENCOUNTER — Emergency Department
Admission: EM | Admit: 2021-12-27 | Discharge: 2021-12-27 | Disposition: A | Discharge status: Home / Self Care | Payer: Medicaid Other | Attending: Emergency Medicine | Admitting: Emergency Medicine

## 2021-12-27 DIAGNOSIS — I10 Essential (primary) hypertension: Secondary | ICD-10-CM | POA: Insufficient documentation

## 2021-12-27 DIAGNOSIS — F419 Anxiety disorder, unspecified: Secondary | ICD-10-CM | POA: Insufficient documentation

## 2021-12-27 MED ORDER — ONDANSETRON 4 MG PO TBDP
8.0000 mg | ORAL_TABLET | Freq: Once | ORAL | Status: AC
  Administered 2021-12-27: 8 mg via ORAL
  Filled 2021-12-27: qty 2

## 2021-12-27 MED ORDER — DOXYCYCLINE HYCLATE 100 MG PO CAPS: 100.0000 mg | ORAL_CAPSULE | Freq: Two times a day (BID) | ORAL | 0 refills | Status: AC

## 2021-12-27 MED ORDER — IPRATROPIUM-ALBUTEROL 0.5-2.5 (3) MG/3ML IN SOLN
3.0000 mL | Freq: Once | RESPIRATORY_TRACT | Status: AC
  Administered 2021-12-27: 3 mL via RESPIRATORY_TRACT
  Filled 2021-12-27: qty 3

## 2021-12-27 MED ORDER — POLYETHYLENE GLYCOL 3350 17 GM/SCOOP PO POWD: ORAL | 0 refills | Status: DC

## 2021-12-27 MED ORDER — PREDNISONE 20 MG PO TABS: 40.0000 mg | ORAL_TABLET | Freq: Every day | ORAL | 0 refills | Status: AC

## 2021-12-27 MED ORDER — HYDROXYZINE HCL 10 MG PO TABS
10.0000 mg | ORAL_TABLET | Freq: Once | ORAL | Status: AC
Start: 2021-12-27 — End: 2021-12-27
  Administered 2021-12-27: 10 mg via ORAL
  Filled 2021-12-27: qty 1

## 2021-12-27 MED ORDER — DOXYCYCLINE HYCLATE 100 MG PO CAPS
100.0000 mg | ORAL_CAPSULE | Freq: Two times a day (BID) | ORAL | 0 refills | Status: DC
  Filled 2021-12-27: qty 20, 10d supply, fill #0

## 2021-12-27 MED ORDER — POLYETHYLENE GLYCOL 3350 17 GM/SCOOP PO POWD
ORAL | 0 refills | Status: DC
Start: 2021-12-27 — End: 2021-12-27
  Filled 2021-12-27: qty 255, fill #0

## 2021-12-27 NOTE — ED Notes (Addendum)
EDP finished at [facility], up to b/r, steady gait.

## 2021-12-27 NOTE — ED Notes (Signed)
EDP at [facility] 

## 2021-12-27 NOTE — ED Notes (Signed)
Verbalizes "breathing better after neb", "shaking increased".

## 2021-12-27 NOTE — ED Notes (Signed)
Pt malingering, delaying d/c. EDP and CN notified. CN at [facility].

## 2021-12-27 NOTE — ED Notes (Addendum)
Delay d/t neb at d/c, neb requested by pt. Has proair and advair.

## 2021-12-27 NOTE — ED Triage Notes (Signed)
Pt states cannot sleep, is anxious. Pt states his MD recently increased his atarax to 50mg  from 10mg . Pt states he has not eaten today. Pt took zquil " acouple of hours ago".

## 2021-12-27 NOTE — ED Notes (Addendum)
CN assisted with cab voucher, pt out to nurse first, nurse first notified of situation and taxi voucher. Agreeable at this time.

## 2021-12-27 NOTE — ED Notes (Signed)
D/c delay d/t pt in the b/r.

## 2021-12-27 NOTE — ED Notes (Addendum)
Pt alert, NAD, calm, interactive, resps e/u, and also restless, tremulous and anxious. Arrives by w/c to exam stretcher in h/w, EDP at [facility]. Denies recent alcohol.

## 2021-12-27 NOTE — ED Notes (Signed)
Up to b/r with coughing, and gagging. Clearing of throat.

## 2021-12-27 NOTE — ED Provider Notes (Signed)
[facility] Provider Note    Event Date/Time   First MD Initiated Contact with Patient 12/27/21 [phone]     (approximate)   History   Anxiety   HPI  Miguel [name] is a 61 y.o. male with a past history of hypertension, alcohol dependence, cirrhosis, anxiety who comes ED complaining of feeling anxious and having difficulty sleeping for the past 2 days.  Regarding alcohol dependence, patient reports he has maintained alcohol abstinence since October 11, 2021.  Patient was in his usual state of health until 2 days ago when he did some vacuuming under his bed where it was very dusty.  Afterward, he had frequent cough and wheezing.  He tried using his inhalers at home many times without complete relief.  He also notes that around this time, he obtained a refill of hydroxyzine which she had previously been given for anxiety.  However, the refill was for 50 mg tablets, where he had previously been on 10 mg tablets.  He feels like 50 mg is too much for him and is making him feel "weird."  Denies chest pain shortness of breath or fever.  No SI or HI or hallucinations.  No drug use.  Patient also complains of constipation, straining to go but no bowel movement today.  He had a normal bowel movement yesterday.  He is eating, no vomiting.     Physical Exam   Triage Vital Signs: ED Triage Vitals  Enc Vitals Group     BP 12/27/21 0549 (!) 138/91     Pulse Rate 12/27/21 0549 (!) 102     Resp 12/27/21 0549 16     Temp 12/27/21 0549 98.3 F (36.8 C)     Temp Source 12/27/21 0549 Oral     SpO2 12/27/21 0549 100 %     Weight 12/27/21 0550 194 lb 0.1 oz (88 kg)     Height 12/27/21 0550 6' (1.829 m)     Head Circumference --      Peak Flow --      Pain Score 12/27/21 0550 0     Pain Loc --      Pain Edu? --      Excl. in [place]? --     Most recent vital signs: Vitals:   12/27/21 0549 12/27/21 0900  BP: (!) 138/91 131/79  Pulse: (!) 102 (!) 106  Resp: 16 20  Temp:  98.3 F (36.8 C) 99 F (37.2 C)  SpO2: 100% 99%     General: Awake, no distress.  CV:  Good peripheral perfusion.  Resp:  Normal effort.  Diffuse expiratory wheezing with mildly prolonged expiratory phase.  No tachypnea Abd:  No distention.  Mild generalized discomfort Other:  No lower extremity edema, no wounds   ED Results / Procedures / Treatments   Labs (all labs ordered are listed, but only abnormal results are displayed) Labs Reviewed - No data to display   EKG     RADIOLOGY     PROCEDURES:  Critical Care performed: No  Procedures   MEDICATIONS ORDERED IN ED: Medications  hydrOXYzine (ATARAX) tablet 10 mg (10 mg Oral Given 12/27/21 0903)  ondansetron (ZOFRAN-ODT) disintegrating tablet 8 mg (8 mg Oral Given 12/27/21 0903)     IMPRESSION / MDM / ASSESSMENT AND PLAN / ED COURSE  I reviewed the triage vital signs and the nursing notes.  Differential diagnosis includes, but is not limited to dehydration, anxiety, constipation    Patient presents with symptoms of anxiousness.  I think this is due to overuse of bronchodilators with beta agonism as well as change in his hydroxyzine dosing causing increased anticholinergic symptoms.  Doubt ACS PE dissection pneumothorax pericarditis pneumonia hypothyroidism psychiatric decompensation or alcohol withdrawal.  Does not require admission due to being ambulatory, conversant, normal mental status and overall nontoxic Appearance.     FINAL CLINICAL IMPRESSION(S) / ED DIAGNOSES   Final diagnoses:  Anxiety     Rx / DC Orders   ED Discharge Orders          Ordered    doxycycline (VIBRAMYCIN) 100 MG capsule  2 times daily,   Status:  Discontinued        12/27/21 0842    polyethylene glycol powder (GLYCOLAX/MIRALAX) 17 GM/SCOOP powder  Status:  Discontinued        12/27/21 0842    doxycycline (VIBRAMYCIN) 100 MG capsule  2 times daily        12/27/21 0842    polyethylene glycol  powder (GLYCOLAX/MIRALAX) 17 GM/SCOOP powder        12/27/21 0842    predniSONE (DELTASONE) 20 MG tablet  Daily with breakfast        12/27/21 0931             Note:  This document was prepared using Dragon voice recognition software and may include unintentional dictation errors.   Carrie Mew, MD 12/27/21 [phone]

## 2021-12-29 ENCOUNTER — Other Ambulatory Visit: Payer: Self-pay

## 2021-12-30 ENCOUNTER — Other Ambulatory Visit: Payer: Self-pay

## 2021-12-30 ENCOUNTER — Ambulatory Visit: Payer: Medicaid Other | Admitting: Licensed Clinical Social Worker

## 2022-01-05 ENCOUNTER — Other Ambulatory Visit: Payer: Self-pay

## 2022-01-07 ENCOUNTER — Ambulatory Visit: Payer: Medicaid Other | Admitting: Licensed Clinical Social Worker

## 2022-01-07 ENCOUNTER — Other Ambulatory Visit: Payer: Self-pay

## 2022-01-07 ENCOUNTER — Ambulatory Visit: Payer: Self-pay | Admitting: Licensed Clinical Social Worker

## 2022-01-07 DIAGNOSIS — F411 Generalized anxiety disorder: Secondary | ICD-10-CM

## 2022-01-07 NOTE — BH Specialist Note (Deleted)
Integrated Behavioral Health Follow Up In-Person Visit  MRN: [id number] Name: GENNARO LIZOTTE    Types of Service: Telephone visit  Interpretor:Yes.   Interpretor Name and Language: N/A  Subjective: SEMIR BRILL is a 61 y.o. male accompanied by  himself Patient was referred by Carlyon Shadow, NP for mental health. Patient reports the following symptoms/concerns: The patient reports that he  Duration of problem: Years; Severity of problem: moderate  Objective: Mood: Euthymic and Affect: Appropriate Risk of harm to self or others: No plan to harm self or others  Life Context: Family and Social: see above School/Work: see above Self-Care: see above Life Changes: see above  Patient and/or Family's Strengths/Protective Factors: Concrete supports in place (healthy food, safe environments, etc.)  Goals Addressed: Patient will:  Reduce symptoms of: agitation, anxiety, depression, insomnia, and stress   Increase knowledge and/or ability of: coping skills, healthy habits, self-management skills, and stress reduction   Demonstrate ability to: Increase healthy adjustment to current life circumstances and Increase adequate support systems for patient/family  Progress towards Goals: Ongoing  Interventions: Interventions utilized:  CBT Cognitive Behavioral Therapywas utilized by the clinician during today's follow up session. Clinician met with patient to identify needs related to stressors and functioning, and assess and monitor for signs and symptoms of anxiety and depression, and assess safety. The clinician processed with the patient how they have been doing since the last follow-up session. Clinician measured the patient's anxiety and depression on a numerical scale. Clinician provided a safe space for the patient to ventilate his frustrations regarding his current life circumstances. Clinician encouraged the patient to continue to work on calming techniques (eg. paced breathing, deep  muscle relaxation, and calming imagery) as a strategy for responding appropriately to anxiety and the urge to avoid situations or self-medicate when they occur and move towards increasing the patients self regulation. The session ended with scheduling.  Standardized Assessments completed: GAD-7 and PHQ 9 GAD-7 = 15 PHQ-9=  11   Assessment: Patient currently experiencing see above.   Patient may benefit from see above.  Plan: Follow up with behavioral health clinician on : 01/15/2022 at noon Behavioral recommendations:  Referral(s): [place] (In Clinic) "From scale of 1-10, how likely are you to follow plan?":   Lesli Albee, LCSWA

## 2022-01-13 ENCOUNTER — Other Ambulatory Visit: Payer: Self-pay

## 2022-01-13 ENCOUNTER — Other Ambulatory Visit: Payer: Self-pay | Admitting: Gerontology

## 2022-01-13 DIAGNOSIS — F411 Generalized anxiety disorder: Secondary | ICD-10-CM

## 2022-01-13 MED ORDER — HYDROXYZINE HCL 25 MG PO TABS
25.0000 mg | ORAL_TABLET | Freq: Three times a day (TID) | ORAL | 0 refills | Status: DC | PRN
  Filled 2022-01-13 – 2022-01-29 (×2): qty 90, 30d supply, fill #0

## 2022-01-15 ENCOUNTER — Ambulatory Visit: Payer: Medicaid Other | Admitting: Licensed Clinical Social Worker

## 2022-01-20 ENCOUNTER — Encounter: Payer: Self-pay | Admitting: Gerontology

## 2022-01-20 ENCOUNTER — Other Ambulatory Visit: Payer: Self-pay

## 2022-01-20 ENCOUNTER — Ambulatory Visit: Payer: Medicaid Other | Admitting: Gerontology

## 2022-01-20 VITALS — BP 105/73 | HR 102 | Temp 97.8°F | Resp 16 | Ht 72.0 in | Wt 188.0 lb

## 2022-01-20 DIAGNOSIS — F1010 Alcohol abuse, uncomplicated: Secondary | ICD-10-CM

## 2022-01-20 DIAGNOSIS — R0602 Shortness of breath: Secondary | ICD-10-CM

## 2022-01-20 DIAGNOSIS — I959 Hypotension, unspecified: Secondary | ICD-10-CM

## 2022-01-20 DIAGNOSIS — F411 Generalized anxiety disorder: Secondary | ICD-10-CM

## 2022-01-20 DIAGNOSIS — R000 Tachycardia, unspecified: Secondary | ICD-10-CM

## 2022-01-20 MED ORDER — ALBUTEROL SULFATE HFA 108 (90 BASE) MCG/ACT IN AERS
2.0000 | INHALATION_SPRAY | RESPIRATORY_TRACT | 1 refills | Status: DC | PRN
  Filled 2022-01-20 – 2022-02-18 (×2): qty 25.5, 51d supply, fill #0

## 2022-01-20 MED ORDER — FOLIC ACID 1 MG PO TABS
ORAL_TABLET | Freq: Every day | ORAL | 1 refills | Status: DC
  Filled 2022-01-20: qty 60, 60d supply, fill #0

## 2022-01-20 NOTE — Progress Notes (Signed)
Established Patient Office Visit  Subjective:  Patient ID: Miguel [name], male    DOB: 11-08-1961  Age: 61 y.o. MRN: [id number]  CC:  Chief Complaint  Patient presents with   Follow-up    HPI Miguel [name]  is a 61 y/o male who has history of Alcohol abuse, Anxiety, Asthma, GERD, Gout, Hypertension, Renal calculi presents for routine follow up visit and medication refill. He states that he's been sober since the end of November 2022. He states that he's compliant with his medications, denies side effects and continues to make healthy lifestyle changes. He states that his mood is good, denies suicidal nor homicidal ideation and follows up weekly with [facility] behavioral health team. His blood pressure was low during visit, heart rate was 102 b.p.m when rechecked, he denies dizziness, headache and vision changes. Overall, he states that he's doing well and offers no further complaint.    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    OCD (obsessive compulsive disorder)    Renal colic     Past Surgical History:  Procedure Laterality Date   CHOLECYSTECTOMY  2012   COLONOSCOPY     COLONOSCOPY WITH PROPOFOL N/A 05/28/2020   Procedure: COLONOSCOPY WITH PROPOFOL;  Surgeon: [name], MD;  Location: [facility] ENDOSCOPY;  Service: Gastroenterology;  Laterality: N/A;   EXTRACORPOREAL SHOCK WAVE LITHOTRIPSY Left 01/12/2019   Procedure: EXTRACORPOREAL SHOCK WAVE LITHOTRIPSY (ESWL);  Surgeon: Billey Co, MD;  Location: [facility] ORS;  Service: Urology;  Laterality: Left;   UPPER GI ENDOSCOPY      Family History  Problem Relation Age of Onset   Alcohol abuse Father    Breast cancer Mother 16   Anxiety disorder Brother    Other Maternal Grandmother         unknown medical history   Other Maternal Grandfather        unknow medical history   Other Paternal Grandmother        unknown medical history   Other Paternal Grandfather        unknown medical history   Healthy Brother     Social History   Socioeconomic History   Marital status: Married    Spouse name: Not on file   Number of children: Not on file   Years of education: Not on file   Highest education level: Not on file  Occupational History   Not on file  Tobacco Use   Smoking status: Former    Types: Cigarettes    Quit date: 03/19/1981    Years since quitting: 40.8   Smokeless tobacco: Never  Vaping Use   Vaping Use: Never used  Substance and Sexual Activity   Alcohol use: Not Currently    Alcohol/week: 24.0 standard drinks    Types: 24 Shots of liquor per week    Comment: last used 09/2021   Drug use: Not Currently    Types: Marijuana    Comment: last use 09/2021   Sexual activity: Not Currently  Other Topics Concern   Not on file  Social History Narrative   Lives at home with his wife and takes care  of his wife. Independent at baseline      - Biggest strain is financial but doesn't think they could got get more help.      Patient expressed interest in possible financial assistance. Informed written consent obtained   Social Determinants of Health   Financial Resource Strain: Not on file  Food Insecurity: No Food Insecurity   Worried About Charity fundraiser in the Last Year: Never true   Ran Out of Food in the Last Year: Never true  Transportation Needs: No Transportation Needs   Lack of Transportation (Medical): No   Lack of Transportation (Non-Medical): No  Physical Activity: Not on file  Stress: Not on file  Social Connections: Not on file  Intimate Partner Violence: Not on file    Outpatient Medications Prior to Visit  Medication Sig Dispense Refill   ADVAIR DISKUS 100-50 MCG/ACT AEPB INHALE 1 PUFF 2 TIMES A DAY. RINSE MOUTH AND SPIT AFTER EACH  USE. 180 each 3   ascorbic acid (VITAMIN C) 500 MG tablet Take by mouth.     hydrOXYzine (ATARAX) 25 MG tablet Take 1 tablet (25 mg total) by mouth 3 (three) times daily as needed for anxiety. 90 tablet 0   lisinopril (ZESTRIL) 40 MG tablet TAKE ONE TABLET BY MOUTH ONCE EVERY DAY. 90 tablet 0   metoprolol tartrate (LOPRESSOR) 25 MG tablet Take 1 tablet (25 mg total) by mouth once daily. 30 tablet 3   omeprazole (PRILOSEC) 20 MG capsule Take 20 mg by mouth daily.     thiamine (VITAMIN B-1) 100 MG tablet TAKE ONE TABLET BY MOUTH ONCE EVERY DAY. 90 tablet 0   albuterol (VENTOLIN HFA) 108 (90 Base) MCG/ACT inhaler Inhale 2 puffs into the lungs once every 4 (four) hours as needed. 34 g 0   folic acid (FOLVITE) 1 MG tablet TAKE ONE TABLET BY MOUTH EVERY DAY 30 tablet 1   allopurinol (ZYLOPRIM) 300 MG tablet TAKE ONE TABLET BY MOUTH EVERY DAY 90 tablet 1   fluticasone (FLONASE) 50 MCG/ACT nasal spray Place 2 sprays into both nostrils once daily. 16 g 0   pantoprazole (PROTONIX) 40 MG tablet Take 1 tablet (40 mg total) by mouth once daily. (Patient not taking: Reported on 12/11/2021) 30 tablet 11   polyethylene glycol powder (GLYCOLAX/MIRALAX) 17 GM/SCOOP powder 1 cap full in a full glass of water, two times a day for 3 days. 255 g 0   No facility-administered medications prior to visit.    No Known Allergies  ROS Review of Systems  Constitutional: Negative.   Eyes: Negative.   Respiratory: Negative.    Cardiovascular: Negative.   Neurological: Negative.   Psychiatric/Behavioral: Negative.       Objective:    Physical Exam HENT:     Head: Normocephalic and atraumatic.     Mouth/Throat:     Mouth: Mucous membranes are moist.  Eyes:     Extraocular Movements: Extraocular movements intact.     Conjunctiva/sclera: Conjunctivae normal.     Pupils: Pupils are equal, round, and reactive to light.  Cardiovascular:     Rate and Rhythm: Tachycardia present.     Pulses: Normal pulses.      Heart sounds: Normal heart sounds.  Pulmonary:     Effort: Pulmonary effort is normal.     Breath sounds: Normal breath sounds.  Neurological:     General: No focal deficit present.     Mental Status: He is alert and oriented to person, place, and time.  Mental status is at baseline.  Psychiatric:        Mood and Affect: Mood normal.        Behavior: Behavior normal.        Thought Content: Thought content normal.        Judgment: Judgment normal.    BP 105/73 (BP Location: Right Arm, Patient Position: Sitting, Cuff Size: Large)    Pulse (!) 102    Temp 97.8 F (36.6 C) (Oral)    Resp 16    Ht 6' (1.829 m)    Wt 188 lb (85.3 kg)    SpO2 98%    BMI 25.50 kg/m  Wt Readings from Last 3 Encounters:  01/20/22 188 lb (85.3 kg)  12/27/21 194 lb 0.1 oz (88 kg)  12/11/21 195 lb 12.8 oz (88.8 kg)     Health Maintenance Due  Topic Date Due   COVID-19 Vaccine (1) Never done   Hepatitis C Screening  Never done   Zoster Vaccines- Shingrix (1 of 2) Never done   COLONOSCOPY (Pts 45-13yr Insurance coverage will need to be confirmed)  05/29/2021    There are no preventive care reminders to display for this patient.  Lab Results  Component Value Date   TSH 0.052 (L) 11/29/2020   Lab Results  Component Value Date   WBC 8.2 11/21/2021   HGB 13.8 11/21/2021   HCT 42.9 11/21/2021   MCV 85.3 11/21/2021   PLT 230 11/21/2021   Lab Results  Component Value Date   NA 136 11/21/2021   K 4.1 11/21/2021   CO2 26 11/21/2021   GLUCOSE 99 11/21/2021   BUN 12 11/21/2021   CREATININE 1.17 11/21/2021   BILITOT 1.6 (H) 11/21/2021   ALKPHOS 67 11/21/2021   AST 20 11/21/2021   ALT 24 11/21/2021   PROT 7.6 11/21/2021   ALBUMIN 4.5 11/21/2021   CALCIUM 9.4 11/21/2021   ANIONGAP 7 11/21/2021   Lab Results  Component Value Date   CHOL 155 05/22/2020   Lab Results  Component Value Date   HDL 65 05/22/2020   Lab Results  Component Value Date   LDLCALC 73 05/22/2020   Lab Results   Component Value Date   TRIG 92 05/22/2020   Lab Results  Component Value Date   CHOLHDL 2.4 05/22/2020   Lab Results  Component Value Date   HGBA1C 5.2 11/29/2020      Assessment & Plan:    1. Shortness of breath - His breathing is under control, he will continue on current medication. - albuterol (VENTOLIN HFA) 108 (90 Base) MCG/ACT inhaler; Inhale 2 puffs into the lungs once every 4 (four) hours as needed.  Dispense: 34 g; Refill: 1  2. Alcohol abuse - His been sober for 3 months, was encouraged to continue. - folic acid (FOLVITE) 1 MG tablet; TAKE ONE TABLET BY MOUTH EVERY DAY  Dispense: 30 tablet; Refill: 1  3. Hypotension, unspecified hypotension type - His blood pressure was 105/73 when rechecked, he was advised to check blood pressure daily, record and bring log to follow up appointment, increase water intake. He was advised to notify clinic if blood pressure is less than 110/70 constantly before taking his Lisinopril and Metoprolol.  4. Generalized anxiety disorder - His anxiety is improving, he will continue on current medication and follow up weekly with behavioral health team.  5. Tachycardia - His heart rated was 102 b.p.m, was advised to increase water intake, notify clinic and go to the ED for  worsening symptoms.     Follow-up: Return in about 3 weeks (around 02/10/2022), or if symptoms worsen or fail to improve.    Inella Kuwahara Jerold Coombe, NP

## 2022-01-22 ENCOUNTER — Ambulatory Visit: Payer: Self-pay | Admitting: Licensed Clinical Social Worker

## 2022-01-22 ENCOUNTER — Other Ambulatory Visit: Payer: Self-pay

## 2022-01-22 ENCOUNTER — Ambulatory Visit: Payer: Medicaid Other | Admitting: Licensed Clinical Social Worker

## 2022-01-22 DIAGNOSIS — F1010 Alcohol abuse, uncomplicated: Secondary | ICD-10-CM

## 2022-01-22 DIAGNOSIS — F411 Generalized anxiety disorder: Secondary | ICD-10-CM

## 2022-01-22 NOTE — BH Specialist Note (Signed)
Integrated Behavioral Health via Telemedicine Visit ? ?01/22/2022 ?Miguel [name] Amy ?[id number] ? ?Number of [place] Clinician visits: No data recorded ?Session Start time: No data recorded  ?Session End time: No data recorded ?Total time in minutes: No data recorded ? ?Referring Provider: Carlyon Shadow, NP ?Patient/Family location: The patient's home address ?[facility] Provider location: [facility] [place] ?All persons participating in visit: Miguel [name]. Miguel [name] and Lesli Albee, MSW, LCSW-A ?Types of Service: Telephone visit ? ?I connected with Miguel [name] via  Telephone or Video Enabled Telemedicine Application  (Video is Caregility application) and verified that I am speaking with the correct person using two identifiers. Discussed confidentiality: Yes  ? ?I discussed the limitations of telemedicine and the availability of in person appointments.  Discussed there is a possibility of technology failure and discussed alternative modes of communication if that failure occurs. ? ?Patient and/or legal guardian expressed understanding and consented to Telemedicine visit: Yes  ? ?Presenting Concerns: ?Patient and/or family reports the following symptoms/concerns: The patient reports that he has been doing better since his last follow-up appointment. He explained that he was able to drive to [facility] to visit his wife on their 52 th anniversary. He discussed how they met, dated, and briefly what he thought was the most positive aspects of their relationship. He shared that he made her a flower arrangement and sent her brought her lunch to celebrate. Kairon noted that he decided to start working on fixing up their home to help improve his mood. He noted that he became frustrated by trying to find new curtains this week. He explained that instead of giving up he took a break to go for a walk and focused on the nice day instead. He shared that he planned to work on the curtains again this afternoon.  Raj denied any panic attacks this week. He shared that his mood has been about the same, but he felt he was getting healthier since he stopped drinking. Lorence shared that at his recent appointment with his primary care provider his weight had remained the same and his high blood pressure had improved. He noted that he felt good about himself for not drinking. Gor denied any suicidal or homicidal thoughts.  ?Duration of problem: Years; Severity of problem: moderate ? ?Patient and/or Family's Strengths/Protective Factors: ?Social and Emotional competence and Concrete supports in place (healthy food, safe environments, etc.) ? ?Goals Addressed: ?Patient will: ? Reduce symptoms of: agitation, anxiety, depression, insomnia, and stress  ? Increase knowledge and/or ability of: coping skills, healthy habits, self-management skills, and stress reduction  ? Demonstrate ability to: Increase healthy adjustment to current life circumstances and Increase adequate support systems for patient/family ? ?Progress towards Goals: ?Ongoing ? ?Interventions: ?Interventions utilized:  CBT Cognitive Behavioral Therapywas utilized by the clinician during today's follow up session. Clinician met with patient to identify needs related to stressors and functioning, and assess and monitor for signs and symptoms of anxiety and depression, and assess safety. The clinician processed with the patient how they have been doing since the last follow-up session. Clinician measured the patient's anxiety and depression on a numerical scale. Clinician encouraged the patient to continue to work on calming techniques (e.g.. paced breathing, deep muscle relaxation, and calming imagery) as a strategy for responding appropriately to anxiety and the urge to avoid situations or self-medicate when they occur and move towards increasing the patients self regulation. Clinician encouraged the patient to continue to work toward implementing self  care into his daily  routine. The session ended with scheduling.  ?Standardized Assessments completed: GAD-7 and PHQ 2 ?GAD-7= 15 ?PHQ-9= 11 ? ? ?Assessment: ?Patient currently experiencing See Above.  ? ?Patient may benefit from See Above. ? ?Plan: ?Follow up with behavioral health clinician on : 01/29/2022 at 11:00 AM ?Behavioral recommendations:  ?Referral(s): [place] (In Clinic) ? ?I discussed the assessment and treatment plan with the patient and/or parent/guardian. They were provided an opportunity to ask questions and all were answered. They agreed with the plan and demonstrated an understanding of the instructions. ?  ?They were advised to call back or seek an in-person evaluation if the symptoms worsen or if the condition fails to improve as anticipated. ? ?Lesli Albee, LCSWA ?

## 2022-01-22 NOTE — BH Specialist Note (Signed)
Integrated Behavioral Health Follow Up Telephone  Visit  MRN: [id number] Name: Miguel [name]    Types of Service: Telephone visit; two identifiers were used to identify the patient.  Interpretor:No. Interpretor Name and Language: N/A  Subjective: Miguel [name] is a 61 y.o. male accompanied by  himself Patient was referred by Carlyon Shadow, NP for mental health. Patient reports the following symptoms/concerns: The patient reports that he has been doing about the same since his last follow-up appointment. He noted that he is feeling better and has recovered from his cold. Kashif shared that his wife remains in [place] in a rehab due to her MS. He discussed his frustrations trying to navigate the system to have his wife transferred closer to home. He noted that he  does not feel she is receiving the best quality of care where she is currently and noted on several occasions he struggled to pick up food and supplies for her that are often unavailable there. The patient discussed his relationship with his wife and shared feeling guilty for the years he spent drinking. He noted that his wife is very proud of his progress. The patient denied any suicidal or homicidal thoughts.  Duration of problem: Years; Severity of problem: moderate  Objective: Mood: Euthymic and Affect: Appropriate Risk of harm to self or others: No plan to harm self or others  Life Context: Family and Social: see above School/Work: see above Self-Care: see above Life Changes: see above  Patient and/or Family's Strengths/Protective Factors: Concrete supports in place (healthy food, safe environments, etc.)  Goals Addressed: Patient will:  Reduce symptoms of: agitation, anxiety, depression, insomnia, and stress   Increase knowledge and/or ability of: coping skills, healthy habits, self-management skills, and stress reduction   Demonstrate ability to: Increase healthy adjustment to current life circumstances and Increase  adequate support systems for patient/family  Progress towards Goals: Ongoing  Interventions: Interventions utilized:  CBT Cognitive Behavioral Therapywas utilized by the clinician during today's follow up session. Clinician met with patient to identify needs related to stressors and functioning, and assess and monitor for signs and symptoms of anxiety and depression, and assess safety. The clinician processed with the patient how they have been doing since the last follow-up session. Clinician measured the patient's anxiety and depression on a numerical scale. Clinician provided a safe space for the patient to ventilate his frustrations regarding his current life circumstances. Clinician encouraged the patient to continue to work on calming techniques (e.g.. paced breathing, deep muscle relaxation, and calming imagery) as a strategy for responding appropriately to anxiety and the urge to avoid situations or self-medicate when they occur and move towards increasing the patients self regulation. The session ended with scheduling.  Standardized Assessments completed: GAD-7 and PHQ 9 GAD-7 = 15 PHQ-9=  11   Assessment: Patient currently experiencing see above.   Patient may benefit from see above.  Plan: Follow up with behavioral health clinician on : 01/15/2022 at noon Behavioral recommendations:  Referral(s): [place] (In Clinic) "From scale of 1-10, how likely are you to follow plan?":   Lesli Albee, LCSWA

## 2022-01-26 ENCOUNTER — Other Ambulatory Visit: Payer: Self-pay

## 2022-01-26 ENCOUNTER — Other Ambulatory Visit: Payer: Self-pay | Admitting: Gerontology

## 2022-01-26 DIAGNOSIS — R0602 Shortness of breath: Secondary | ICD-10-CM

## 2022-01-26 DIAGNOSIS — F411 Generalized anxiety disorder: Secondary | ICD-10-CM

## 2022-01-27 ENCOUNTER — Other Ambulatory Visit: Payer: Self-pay

## 2022-01-27 ENCOUNTER — Ambulatory Visit: Payer: Medicaid Other | Admitting: Pharmacy Technician

## 2022-01-27 DIAGNOSIS — Z79899 Other long term (current) drug therapy: Secondary | ICD-10-CM

## 2022-01-27 NOTE — Progress Notes (Signed)
Assisted patient with the completion of paperwork for re-certification.  Received updated proof of income.  Patient eligible to receive medication assistance at [facility] until time for re-certification in 2024, and as long as eligibility requirements continue to be met. ? ? J.  ?Care Manager ?[facility]  ?

## 2022-01-29 ENCOUNTER — Ambulatory Visit: Payer: Self-pay | Admitting: Licensed Clinical Social Worker

## 2022-01-29 ENCOUNTER — Other Ambulatory Visit: Payer: Self-pay

## 2022-01-29 ENCOUNTER — Ambulatory Visit: Payer: Medicaid Other | Admitting: Licensed Clinical Social Worker

## 2022-01-29 DIAGNOSIS — F1010 Alcohol abuse, uncomplicated: Secondary | ICD-10-CM

## 2022-01-29 DIAGNOSIS — F411 Generalized anxiety disorder: Secondary | ICD-10-CM

## 2022-01-29 NOTE — BH Specialist Note (Signed)
Integrated Behavioral Health via Telemedicine Visit ? ?02/04/2022 ?Miguel [name] ?[id number] ? ? ?Referring Provider: Carlyon Shadow, NP  ?Patient/Family location: The patient's home  ?[facility] Provider location: [facility] [place] ?All persons participating in visit: [name] and [name], MSW, LCSW-A ?Types of Service: Telephone visit ? ?I connected with [name] via Telephone or Video Enabled Telemedicine Application  (Video is Caregility application) and verified that I am speaking with the correct person using two identifiers. Discussed confidentiality: Yes  ? ?I discussed the limitations of telemedicine and the availability of in person appointments.  Discussed there is a possibility of technology failure and discussed alternative modes of communication if that failure occurs. ? ?Patient and/or legal guardian expressed understanding and consented to Telemedicine visit: Yes  ? ?Presenting Concerns: ?Patient and/or family reports the following symptoms/concerns: The patient reports that he has been doing about that the same since his last follow-up appointment. He shared that he finished hanging the curtains in his home and hopes to surprise his wife when she is able to come home. He discussed celebrating his wife's birthday and their wedding anniversary. Deadrick noted that he had spoken to his mother and brother. He discussed family stressors and dynamics. The patient shared that he felt bad about himself at times because of all the years he drank and the things he put his wife through. He explained that he would go to [place] this week that he has struggled with getting out of the house lately. Yahya noted that he needed to go [facility] this [facility]. He noted that he had two hydroxyzine 25 MG tablets left. He shared that he was looking forward to starting more home improvement projects this week. Emerson denied any suicidal or homicidal thoughts.  ?Duration of  problem: Years; Severity of problem: moderate ? ?Patient and/or Family's Strengths/Protective Factors: ?Concrete supports in place (healthy food, safe environments, etc.) ? ?Goals Addressed: ?Patient will: ? Reduce symptoms of: agitation, anxiety, depression, insomnia, and stress  ? Increase knowledge and/or ability of: coping skills, healthy habits, self-management skills, and stress reduction  ? Demonstrate ability to: Increase healthy adjustment to current life circumstances and Increase adequate support systems for patient/family ? ?Progress towards Goals: ?Ongoing ? ?Interventions: ?Interventions utilized:  CBT Cognitive Behavioral Therapy was utilized by the clinician during today's follow up session. Clinician met with patient to identify needs related to stressors and functioning, and assess and monitor for signs and symptoms of anxiety and depression, and assess safety. The clinician processed with the patient how they have been doing since the last follow-up session. Clinician measured the patient's anxiety and depression on a numerical scale. Clinician explained cognitive distortions with the patient and assisted the patient in developing more positive, realistic, alternative thoughts that could mediate confidence, self-assurance, and relaxation. Clinician encouraged the patient go to [facility] this week to access their [facility] program. The session ended with scheduling.  ?Standardized Assessments completed: GAD-7 and PHQ 9 ?PHQ-9 = 05 ?GAD-7 = 14 ? ?Assessment: ?Patient currently experiencing see above.  ? ?Patient may benefit from see above. ? ?Plan: ?Follow up with behavioral health clinician on : 02/04/2022 at 8:00 AM  ?Behavioral recommendations: see above ?Referral(s): [place] (In Clinic) ? ?I discussed the assessment and treatment plan with the patient and/or parent/guardian. They were provided an opportunity to ask questions and all were answered. They agreed with the plan  and demonstrated an understanding of the instructions. ?  ?  They were advised to call back or seek an in-person evaluation if the symptoms worsen or if the condition fails to improve as anticipated. ? ?Lesli Albee, LCSWA ?

## 2022-02-04 ENCOUNTER — Other Ambulatory Visit: Payer: Self-pay

## 2022-02-04 ENCOUNTER — Ambulatory Visit: Payer: Medicaid Other | Admitting: Licensed Clinical Social Worker

## 2022-02-04 ENCOUNTER — Ambulatory Visit: Payer: Self-pay | Admitting: Licensed Clinical Social Worker

## 2022-02-04 DIAGNOSIS — F1010 Alcohol abuse, uncomplicated: Secondary | ICD-10-CM

## 2022-02-04 DIAGNOSIS — F411 Generalized anxiety disorder: Secondary | ICD-10-CM

## 2022-02-04 NOTE — BH Specialist Note (Signed)
Integrated Behavioral Health via Telemedicine Visit ? ?02/04/2022 ?Miguel [name] ?[id number] ? ?Number of [place] Clinician visits: No data recorded ?Session Start time: No data recorded  ?Session End time: No data recorded ?Total time in minutes: No data recorded ? ?Referring Provider: Carlyon Shadow, NP  ?Patient/Family location: The patient's home address ?[facility] Provider location: [facility] [place] ?All persons participating in visit: Cabe Lashley. [name] and [name], MSW, LCSW-A ?Types of Service: Telephone visit ? ?I connected with [name] via  Telephone or Video Enabled Telemedicine Application  (Video is Caregility application) and verified that I am speaking with the correct person using two identifiers. Discussed confidentiality: Yes  ? ?I discussed the limitations of telemedicine and the availability of in person appointments.  Discussed there is a possibility of technology failure and discussed alternative modes of communication if that failure occurs. ? ?Patient and/or legal guardian expressed understanding and consented to Telemedicine visit: Yes  ? ?Presenting Concerns: ?Patient and/or family reports the following symptoms/concerns: The patient reports that he has been doing the same since his last follow-up appointment. He shared that he spent several days at home alone without leaving because the weather was cold. He shared that he continued to struggle with a stuffy nose and described that as very concerning. He noted he would like to stop using nose sprays but feels anxious when his nose is congested. Shep shared that he drove to [place] to visit his wife, played the radio, and rolled his window partly down to help him relax as he drove. He shared that he has to plan his trip out in advance and check his car thoroughly before leaving every time he drives, even if it is just a block away. Markas noted that when he stopped drinking last November, he became nervous  about driving. He explained that he has to stay home on some days because he feels too anxious to go anywhere. Lathon stated that he feels his wife is doing better this month, reducing the stress in both their lives. The patient reports that he picked up his prescription for hydroxyzine (ATARAX) 25 MG tablets on 01/29/2022 and  takes 25 MG two to three times per day. Oron discussed other financial and health stressors impacting his life. He shared that he is looking forward to warmer weather and working on his car. Casimir explained that cars had been his hobby and passion for many years. The patient denied any suicidal or homicidal thoughts. Duration of problem: Years; Severity of problem: moderate ? ?Patient and/or Family's Strengths/Protective Factors: ?Concrete supports in place (healthy food, safe environments, etc.) and Sense of purpose ? ?Goals Addressed: ?Patient will: ? Reduce symptoms of: agitation, anxiety, insomnia, and stress  ? Increase knowledge and/or ability of: coping skills, healthy habits, self-management skills, and stress reduction  ? Demonstrate ability to: Increase healthy adjustment to current life circumstances and Increase adequate support systems for patient/family ? ?Progress towards Goals: ?Ongoing ? ?Interventions: ?Interventions utilized:  CBT Cognitive Behavioral Therapywas utilized by the clinician during today's follow up session. Clinician met with patient to identify needs related to stressors and functioning, and assess and monitor for signs and symptoms of anxiety and depression, and assess safety. The clinician processed with the patient how they have been doing since the last follow-up session. Clinician measured the patient's anxiety and depression on a numerical scale. Clinician explored with the patient his triggers for anxiety. Clinician assessed the patient's compliance with the physician?s prescription  for psychotropic medication and monitored for the medication?s effectiveness  and side effects. Clinician encouraged the patient to continue to work on calming techniques (e.g.. paced breathing, deep muscle relaxation, and calming imagery) as a strategy for responding appropriately to anxiety and the urge to avoid situations or self medicate when they occur and move towards increasing the patients self regulation. The session ended with scheduling.  ?Standardized Assessments completed: GAD-7 and PHQ 9 ?PHQ-9 = 03 ?GAD-7 = 10 ? ?Patient and/or Family Response: The patient stated that he would go to [facility] this week to enroll in [place].  ? ?Assessment: ?Patient currently experiencing see above.  ? ?Patient may benefit from see above. ? ?Plan: ?Follow up with behavioral health clinician on : 02/12/2022 at 10:00 AM  ?Behavioral recommendations:  ?Referral(s): [place] (In Clinic) ? ?I discussed the assessment and treatment plan with the patient and/or parent/guardian. They were provided an opportunity to ask questions and all were answered. They agreed with the plan and demonstrated an understanding of the instructions. ?  ?They were advised to call back or seek an in-person evaluation if the symptoms worsen or if the condition fails to improve as anticipated. ? ?Lesli Albee, LCSWA ?

## 2022-02-10 ENCOUNTER — Ambulatory Visit: Payer: Medicaid Other | Admitting: Gerontology

## 2022-02-12 ENCOUNTER — Ambulatory Visit: Payer: Self-pay | Admitting: Licensed Clinical Social Worker

## 2022-02-12 ENCOUNTER — Ambulatory Visit: Payer: Medicaid Other | Admitting: Licensed Clinical Social Worker

## 2022-02-12 DIAGNOSIS — F1010 Alcohol abuse, uncomplicated: Secondary | ICD-10-CM

## 2022-02-12 DIAGNOSIS — F411 Generalized anxiety disorder: Secondary | ICD-10-CM

## 2022-02-12 NOTE — BH Specialist Note (Signed)
Integrated Behavioral Health via Telemedicine Visit ? ?02/19/2022 ?Miguel [name] ?[id number] ? ? ?Referring Provider: Carlyon Shadow, NP ?Patient/Family location: The patients address ?[facility] Provider location: [facility] [place] ?All persons participating in visit: Raja Liska. Jester and [name], LCSW-A ?Types of Service: Telephone visit ? ?I connected with [name]  via  Telephone or Video Enabled Telemedicine Application  (Video is Caregility application) and verified that I am speaking with the correct person using two identifiers. Discussed confidentiality: Yes  ? ?I discussed the limitations of telemedicine and the availability of in person appointments.  Discussed there is a possibility of technology failure and discussed alternative modes of communication if that failure occurs. ? ?Patient and/or legal guardian expressed understanding and consented to Telemedicine visit: Yes  ? ?Presenting Concerns: ?Patient and/or family reports the following symptoms/concerns: The patient reports that he has been doing well since his last follow-up appointment. He shared that he has been experiencing some increased stress this week. 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 He noted the repairs were taking longer than he anticipated and he missed an appointment with his primary care provider, but later was able to reschedule. He shared that his wife has a new roommate who is cognitively impaired at the nursing home she is in and this has caused his wife some distress. Abou noted that his wife calls him several times a day upset and he does not know how to help her. The patient discussed other familial and financial stressors impacting his life. He noted that he has been focusing on exercise and staying busy helps. He noted that he has been sleeping better, but struggled a few nights due to his smoke detector beeping  because the battery was low. He explained that he has vaulted ceilings and is unable to reach it so he is going to ask someone to come over to help him. Khyrie denied any suicidal or homicidal thoughts.  ?Duration of problem: Years; Severity of problem: moderate ? ?Patient and/or Family's Strengths/Protective Factors: ?Concrete supports in place (healthy food, safe environments, etc.) ? ?Goals Addressed: ?Patient will: ? Reduce symptoms of: agitation, anxiety, depression, insomnia, and stress  ? Increase knowledge and/or ability of: coping skills, healthy habits, self-management skills, and stress reduction  ? Demonstrate ability to: Increase healthy adjustment to current life circumstances and Increase motivation to adhere to plan of care ? ?Progress towards Goals: ?Ongoing ? ?Interventions: ?Interventions utilized:  CBT Cognitive Behavioral Therapy was utilized by the clinician during today's follow up session. Clinician met with patient to identify needs related to stressors and functioning, and assess and monitor for signs and symptoms of anxiety and depression, and assess safety. The clinician processed with the patient how they have been doing since the last follow-up session. Clinician measured the patients anxiety and depression on a numerical scale. Clinician monitored the patient's compliance with psychotropic medication prescribed by his primary care provider, noting that the patient reported taking hydroxyzine 25 MG three times a day as prescribed and felt it was effective in helping him manage his anxiety symptoms. Clinician encouraged the patient to focus on the present verses the past and to continue to practice daily self care to help him deal with his current life circumstances. The session ended with scheduling.  ?Standardized Assessments completed: GAD-7 and PHQ 9 ?PHQ-9= 04 ?GAD-7= 14 ? ? ?Assessment: ?Patient currently  experiencing see above.  ? ?Patient may benefit from see above. ? ?Plan: ?Follow  up with behavioral health clinician on : 02/17/2022  ?Behavioral recommendations:  ?Referral(s): [place] (In Clinic) ? ?I discussed the assessment and treatment plan with the patient and/or parent/guardian. They were provided an opportunity to ask questions and all were answered. They agreed with the plan and demonstrated an understanding of the instructions. ?  ?They were advised to call back or seek an in-person evaluation if the symptoms worsen or if the condition fails to improve as anticipated. ? ?Lesli Albee, LCSWA ?

## 2022-02-13 ENCOUNTER — Telehealth: Payer: Self-pay | Admitting: Pharmacist

## 2022-02-13 NOTE — Telephone Encounter (Signed)
02/13/2022 12:07:34 PM - [facility] call in to [facility] ?-- Arletha Pili - Friday, February 13, 2022 12:06 PM -- Refill for [facility] call in to [place]. Allow 5-7 business days. Order# [id number] ?

## 2022-02-17 ENCOUNTER — Ambulatory Visit: Payer: Medicaid Other | Admitting: Gerontology

## 2022-02-17 ENCOUNTER — Ambulatory Visit: Payer: Medicaid Other | Admitting: Licensed Clinical Social Worker

## 2022-02-17 ENCOUNTER — Encounter: Payer: Self-pay | Admitting: Gerontology

## 2022-02-17 ENCOUNTER — Ambulatory Visit: Payer: Self-pay | Admitting: Licensed Clinical Social Worker

## 2022-02-17 ENCOUNTER — Other Ambulatory Visit: Payer: Self-pay

## 2022-02-17 VITALS — BP 112/78 | HR 107 | Temp 97.8°F | Resp 16 | Ht 72.0 in | Wt 187.9 lb

## 2022-02-17 DIAGNOSIS — F411 Generalized anxiety disorder: Secondary | ICD-10-CM

## 2022-02-17 DIAGNOSIS — R000 Tachycardia, unspecified: Secondary | ICD-10-CM

## 2022-02-17 DIAGNOSIS — I10 Essential (primary) hypertension: Secondary | ICD-10-CM

## 2022-02-17 MED ORDER — LISINOPRIL 40 MG PO TABS
ORAL_TABLET | Freq: Every day | ORAL | 0 refills | Status: DC
  Filled 2022-02-17: qty 90, fill #0

## 2022-02-17 MED ORDER — HYDROXYZINE HCL 25 MG PO TABS
25.0000 mg | ORAL_TABLET | Freq: Three times a day (TID) | ORAL | 0 refills | Status: DC | PRN
  Filled 2022-02-17 – 2022-02-23 (×2): qty 90, 30d supply, fill #0

## 2022-02-17 MED ORDER — METOPROLOL TARTRATE 25 MG PO TABS
25.0000 mg | ORAL_TABLET | Freq: Every day | ORAL | 3 refills | Status: DC
  Filled 2022-02-17: qty 30, 30d supply, fill #0

## 2022-02-17 NOTE — BH Specialist Note (Signed)
Integrated Behavioral Health via Telemedicine Visit ? ?02/17/2022 ?[name] ?[id number] ? ? ?Referring Provider: Carlyon Shadow, NP  ?Patient/Family location: The patient's home  ?[facility] Provider location: [facility] ?All persons participating in visit: Posey Petrik. Minney and [name], LCSW-A ?Types of Service: Telephone visit ? ?I connected with Miguel [name] via  Telephone or Video Enabled Telemedicine Application  (Video is Caregility application) and verified that I am speaking with the correct person using two identifiers. Discussed confidentiality: Yes  ? ?I discussed the limitations of telemedicine and the availability of in person appointments.  Discussed there is a possibility of technology failure and discussed alternative modes of communication if that failure occurs. ? ?Patient and/or legal guardian expressed understanding and consented to Telemedicine visit: Yes  ? ?Presenting Concerns: ?Patient and/or family reports the following symptoms/concerns: The patient reports that he has been doing well since his last follow-up appointment. He noted that he has experienced an increase in housing related stressors. He shared that he feels like he should be able to handle this on his own to protect his wife on one hand and on the other he does not want to keep things from her. Roth noted that has not relapsed and continues to live sober. He shared that he had a good report from his primary care provider today and is more mindful of his diet and has been eating healthier foods. The patient explained that he felt lonely despite calling his wife and visiting her every week. He shared this is the longest they have been apart in over thirty years. He stated that he is looking forward to visiting his wife tomorrow morning. Nikolaus denied any suicidal or homicidal thoughts.  ?Duration of problem: Years; Severity of problem: moderate ? ?Patient and/or Family's Strengths/Protective Factors: ?Concrete  supports in place (healthy food, safe environments, etc.) and Sense of purpose ? ?Goals Addressed: ?Patient will: ? Reduce symptoms of: agitation, anxiety, depression, insomnia, and stress  ? Increase knowledge and/or ability of: coping skills, healthy habits, self-management skills, and stress reduction  ? Demonstrate ability to: Increase healthy adjustment to current life circumstances and Increase adequate support systems for patient/family ? ?Progress towards Goals: ?Ongoing ? ?Interventions: ?Interventions utilized:  CBT Cognitive Behavioral Therapy was utilized by the clinician during today's follow up session. Clinician met with patient to identify needs related to stressors and functioning, and assess and monitor for signs and symptoms of anxiety and depression, and assess safety. The clinician processed with the patient how they have been doing since the last follow-up session.Clinician measured the patient's anxiety and depression on a numerical scale. Clinician continued to introduced distress tolerance skills to the patient and explained that negative emotions will usually lessen in intensity and pass over time. The session ended with scheduling. ?Standardized Assessments completed: GAD-7 and PHQ 9 ?GAD-7= 15 ?PHQ-9= 04 ? ? ?Assessment: ?Patient currently experiencing See above.  ? ?Patient may benefit from See above. ? ?Plan: ?Follow up with behavioral health clinician on : 02/25/2022 at 11:00 AM  ?Behavioral recommendations:  ?Referral(s): [facility] (In Clinic) ? ?I discussed the assessment and treatment plan with the patient and/or parent/guardian. They were provided an opportunity to ask questions and all were answered. They agreed with the plan and demonstrated an understanding of the instructions. ?  ?They were advised to call back or seek an in-person evaluation if the symptoms worsen or if the condition fails to improve as anticipated. ? ?Lesli Albee,  LCSWA ?

## 2022-02-17 NOTE — Patient Instructions (Signed)
DASH Eating Plan ?DASH stands for Dietary Approaches to Stop Hypertension. The DASH eating plan is a healthy eating plan that has been shown to: ?Reduce high blood pressure (hypertension). ?Reduce your risk for type 2 diabetes, heart disease, and stroke. ?Help with weight loss. ?What are tips for following this plan? ?Reading food labels ?Check food labels for the amount of salt (sodium) per serving. Choose foods with less than 5 percent of the Daily Value of sodium. Generally, foods with less than 300 milligrams (mg) of sodium per serving fit into this eating plan. ?To find whole grains, look for the word "whole" as the first word in the ingredient list. ?Shopping ?Buy products labeled as "low-sodium" or "no salt added." ?Buy fresh foods. Avoid canned foods and pre-made or frozen meals. ?Cooking ?Avoid adding salt when cooking. Use salt-free seasonings or herbs instead of table salt or sea salt. Check with your health care provider or pharmacist before using salt substitutes. ?Do not fry foods. Cook foods using healthy methods such as baking, boiling, grilling, roasting, and broiling instead. ?Cook with heart-healthy oils, such as olive, canola, avocado, soybean, or sunflower oil. ?Meal planning ? ?Eat a balanced diet that includes: ?4 or more servings of fruits and 4 or more servings of vegetables each day. Try to fill one-half of your plate with fruits and vegetables. ?6-8 servings of whole grains each day. ?Less than 6 oz (170 g) of lean meat, poultry, or fish each day. A 3-oz (85-g) serving of meat is about the same size as a deck of cards. One egg equals 1 oz (28 g). ?2-3 servings of low-fat dairy each day. One serving is 1 cup (237 mL). ?1 serving of nuts, seeds, or beans 5 times each week. ?2-3 servings of heart-healthy fats. Healthy fats called omega-3 fatty acids are found in foods such as walnuts, flaxseeds, fortified milks, and eggs. These fats are also found in cold-water fish, such as sardines, salmon,  and mackerel. ?Limit how much you eat of: ?Canned or prepackaged foods. ?Food that is high in trans fat, such as some fried foods. ?Food that is high in saturated fat, such as fatty meat. ?Desserts and other sweets, sugary drinks, and other foods with added sugar. ?Full-fat dairy products. ?Do not salt foods before eating. ?Do not eat more than 4 egg yolks a week. ?Try to eat at least 2 vegetarian meals a week. ?Eat more home-cooked food and less restaurant, buffet, and fast food. ?Lifestyle ?When eating at a restaurant, ask that your food be prepared with less salt or no salt, if possible. ?If you drink alcohol: ?Limit how much you use to: ?0-1 drink a day for women who are not pregnant. ?0-2 drinks a day for men. ?Be aware of how much alcohol is in your drink. In the [place] one drink equals one 12 oz bottle of beer (355 mL), one 5 oz glass of wine (148 mL), or one 1? oz glass of hard liquor (44 mL). ?General information ?Avoid eating more than 2,300 mg of salt a day. If you have hypertension, you may need to reduce your sodium intake to 1,500 mg a day. ?Work with your health care provider to maintain a healthy body weight or to lose weight. Ask what an ideal weight is for you. ?Get at least 30 minutes of exercise that causes your heart to beat faster (aerobic exercise) most days of the week. Activities may include walking, swimming, or biking. ?Work with your health care provider or dietitian to   adjust your eating plan to your individual calorie needs. ?What foods should I eat? ?Fruits ?All fresh, dried, or frozen fruit. Canned fruit in natural juice (without added sugar). ?Vegetables ?Fresh or frozen vegetables (raw, steamed, roasted, or grilled). Low-sodium or reduced-sodium tomato and vegetable juice. Low-sodium or reduced-sodium tomato sauce and tomato paste. Low-sodium or reduced-sodium canned vegetables. ?Grains ?Whole-grain or whole-wheat bread. Whole-grain or whole-wheat pasta. Brown rice. Oatmeal. Quinoa.  Bulgur. Whole-grain and low-sodium cereals. Pita bread. Low-fat, low-sodium crackers. Whole-wheat flour tortillas. ?Meats and other proteins ?Skinless chicken or turkey. Ground chicken or turkey. Pork with fat trimmed off. Fish and seafood. Egg whites. Dried beans, peas, or lentils. Unsalted nuts, nut butters, and seeds. Unsalted canned beans. Lean cuts of beef with fat trimmed off. Low-sodium, lean precooked or cured meat, such as sausages or meat loaves. ?Dairy ?Low-fat (1%) or fat-free (skim) milk. Reduced-fat, low-fat, or fat-free cheeses. Nonfat, low-sodium ricotta or cottage cheese. Low-fat or nonfat yogurt. Low-fat, low-sodium cheese. ?Fats and oils ?Soft margarine without trans fats. Vegetable oil. Reduced-fat, low-fat, or light mayonnaise and salad dressings (reduced-sodium). Canola, safflower, olive, avocado, soybean, and sunflower oils. Avocado. ?Seasonings and condiments ?Herbs. Spices. Seasoning mixes without salt. ?Other foods ?Unsalted popcorn and pretzels. Fat-free sweets. ?The items listed above may not be a complete list of foods and beverages you can eat. Contact a dietitian for more information. ?What foods should I avoid? ?Fruits ?Canned fruit in a light or heavy syrup. Fried fruit. Fruit in cream or butter sauce. ?Vegetables ?Creamed or fried vegetables. Vegetables in a cheese sauce. Regular canned vegetables (not low-sodium or reduced-sodium). Regular canned tomato sauce and paste (not low-sodium or reduced-sodium). Regular tomato and vegetable juice (not low-sodium or reduced-sodium). Pickles. Olives. ?Grains ?Baked goods made with fat, such as croissants, muffins, or some breads. Dry pasta or rice meal packs. ?Meats and other proteins ?Fatty cuts of meat. Ribs. Fried meat. Bacon. Bologna, salami, and other precooked or cured meats, such as sausages or meat loaves. Fat from the back of a pig (fatback). Bratwurst. Salted nuts and seeds. Canned beans with added salt. Canned or smoked fish.  Whole eggs or egg yolks. Chicken or turkey with skin. ?Dairy ?Whole or 2% milk, cream, and half-and-half. Whole or full-fat cream cheese. Whole-fat or sweetened yogurt. Full-fat cheese. Nondairy creamers. Whipped toppings. Processed cheese and cheese spreads. ?Fats and oils ?Butter. Stick margarine. Lard. Shortening. Ghee. Bacon fat. Tropical oils, such as coconut, palm kernel, or palm oil. ?Seasonings and condiments ?Onion salt, garlic salt, seasoned salt, table salt, and sea salt. Worcestershire sauce. Tartar sauce. Barbecue sauce. Teriyaki sauce. Soy sauce, including reduced-sodium. Steak sauce. Canned and packaged gravies. Fish sauce. Oyster sauce. Cocktail sauce. Store-bought horseradish. Ketchup. Mustard. Meat flavorings and tenderizers. Bouillon cubes. Hot sauces. Pre-made or packaged marinades. Pre-made or packaged taco seasonings. Relishes. Regular salad dressings. ?Other foods ?Salted popcorn and pretzels. ?The items listed above may not be a complete list of foods and beverages you should avoid. Contact a dietitian for more information. ?Where to find more information ?National Heart, Lung, and Blood Institute: www.nhlbi.nih.gov ?American Heart Association: www.heart.org ?Academy of Nutrition and Dietetics: www.eatright.org ?National Kidney Foundation: www.kidney.org ?Summary ?The DASH eating plan is a healthy eating plan that has been shown to reduce high blood pressure (hypertension). It may also reduce your risk for type 2 diabetes, heart disease, and stroke. ?When on the DASH eating plan, aim to eat more fresh fruits and vegetables, whole grains, lean proteins, low-fat dairy, and heart-healthy fats. ?With the DASH   eating plan, you should limit salt (sodium) intake to 2,300 mg a day. If you have hypertension, you may need to reduce your sodium intake to 1,500 mg a day. ?Work with your health care provider or dietitian to adjust your eating plan to your individual calorie needs. ?This information is not  intended to replace advice given to you by your health care provider. Make sure you discuss any questions you have with your health care provider. ?Document Revised: 10/06/2019 Document Reviewed: 11/20/

## 2022-02-17 NOTE — Progress Notes (Signed)
? ?Established Patient Office Visit ? ?Subjective:  ?Patient ID: Miguel [name], male    DOB: 1960/12/17  Age: 61 y.o. MRN: [id number] ? ?CC:  ?Chief Complaint  ?Patient presents with  ? Follow-up  ? ? ?HPI ?Miguel [name] is a 61 y/o male who has history of Alcohol abuse, Anxiety, Asthma, GERD, Gout, Hypertension, Renal calculi presents for routine follow up visit and medication refill. He states that he's compliant with his medication, denies side effects and continues to make healthy lifestyle changes.  He states that he is still sober and has not consumed alcoholic beverage. His heart rate was 107 b.p.m, states that he has not being taking his metoprolol for 1 week, denies chest pain, palpitation, lightheadedness.  He stated that his mood is good, denies suicidal no homicidal ideation.  Overall, he states that he is doing well and offers no further complaint. ? ?Past Medical History:  ?Diagnosis Date  ? Alcohol abuse   ? Alcohol abuse   ? Anxiety   ? Asthma   ? Family history of breast cancer   ? GERD (gastroesophageal reflux disease)   ? Gout   ? Hx of colonic polyps   ? Hypertension   ? Kidney stone   ? 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 OCD (obsessive compulsive disorder)   ? Renal colic   ? ? ?Past Surgical History:  ?Procedure Laterality Date  ? CHOLECYSTECTOMY  2012  ? COLONOSCOPY    ? COLONOSCOPY WITH PROPOFOL N/A 05/28/2020  ? Procedure: COLONOSCOPY WITH PROPOFOL;  Surgeon: [name], MD;  Location: [facility] ENDOSCOPY;  Service: Gastroenterology;  Laterality: N/A;  ? EXTRACORPOREAL SHOCK WAVE LITHOTRIPSY Left 01/12/2019  ? Procedure: EXTRACORPOREAL SHOCK WAVE LITHOTRIPSY (ESWL);  Surgeon: Billey Co, MD;  Location: [facility] ORS;  Service: Urology;  Laterality: Left;  ? UPPER GI ENDOSCOPY    ? ? ?Family History  ?Problem Relation Age of Onset  ? Alcohol abuse Father   ? Breast cancer Mother 29  ? Anxiety disorder Brother   ? Other Maternal Grandmother   ?     unknown medical history  ? Other  Maternal Grandfather   ?     unknow medical history  ? Other Paternal Grandmother   ?     unknown medical history  ? Other Paternal Grandfather   ?     unknown medical history  ? Healthy Brother   ? ? ?Social History  ? ?Socioeconomic History  ? Marital status: Married  ?  Spouse name: Not on file  ? Number of children: Not on file  ? Years of education: Not on file  ? Highest education level: Not on file  ?Occupational History  ? Not on file  ?Tobacco Use  ? Smoking status: Former  ?  Types: Cigarettes  ?  Quit date: 03/19/1981  ?  Years since quitting: 40.9  ? Smokeless tobacco: Never  ?Vaping Use  ? Vaping Use: Never used  ?Substance and Sexual Activity  ? Alcohol use: Not Currently  ?  Alcohol/week: 24.0 standard drinks  ?  Types: 24 Shots of liquor per week  ?  Comment: last used 09/2021  ? Drug use: Not Currently  ?  Types: Marijuana  ?  Comment: last use 09/2021  ? Sexual activity: Not Currently  ?Other Topics Concern  ? Not on file  ?Social History Narrative  ? Lives at home with his wife and takes care of his wife. Independent at  baseline  ?   ? - Biggest strain is financial but doesn't think they could got get more help.  ?   ? Patient expressed interest in possible financial assistance. Informed written consent obtained  ? ?Social Determinants of Health  ? ?Financial Resource Strain: Not on file  ?Food Insecurity: No Food Insecurity  ? Worried About Charity fundraiser in the Last Year: Never true  ? Ran Out of Food in the Last Year: Never true  ?Transportation Needs: No Transportation Needs  ? Lack of Transportation (Medical): No  ? Lack of Transportation (Non-Medical): No  ?Physical Activity: Not on file  ?Stress: Not on file  ?Social Connections: Not on file  ?Intimate Partner Violence: Not on file  ? ? ?Outpatient Medications Prior to Visit  ?Medication Sig Dispense Refill  ? ADVAIR DISKUS 100-50 MCG/ACT AEPB INHALE 1 PUFF 2 TIMES A DAY. RINSE MOUTH AND SPIT AFTER EACH USE. 180 each 3  ? albuterol  (VENTOLIN HFA) 108 (90 Base) MCG/ACT inhaler Inhale 2 puffs into the lungs once every 4 (four) hours as needed. 34 g 1  ? ascorbic acid (VITAMIN C) 500 MG tablet Take by mouth.    ? folic acid (FOLVITE) 1 MG tablet TAKE ONE TABLET BY MOUTH ONCE EVERY DAY. 30 tablet 1  ? omeprazole (PRILOSEC) 20 MG capsule Take 20 mg by mouth daily.    ? thiamine (VITAMIN B-1) 100 MG tablet TAKE ONE TABLET BY MOUTH ONCE EVERY DAY. 90 tablet 0  ? hydrOXYzine (ATARAX) 25 MG tablet Take 1 tablet (25 mg total) by mouth 3 (three) times daily as needed for anxiety. 90 tablet 0  ? lisinopril (ZESTRIL) 40 MG tablet TAKE ONE TABLET BY MOUTH ONCE EVERY DAY. 90 tablet 0  ? metoprolol tartrate (LOPRESSOR) 25 MG tablet Take 1 tablet (25 mg total) by mouth once daily. 30 tablet 3  ? ?No facility-administered medications prior to visit.  ? ? ?No Known Allergies ? ?ROS ?Review of Systems  ?Constitutional: Negative.   ?Eyes: Negative.   ?Respiratory: Negative.    ?Cardiovascular: Negative.   ?Neurological: Negative.   ?Psychiatric/Behavioral: Negative.    ? ?  ?Objective:  ?  ?Physical Exam ?HENT:  ?   Head: Normocephalic and atraumatic.  ?   Mouth/Throat:  ?   Mouth: Mucous membranes are moist.  ?Eyes:  ?   Extraocular Movements: Extraocular movements intact.  ?   Conjunctiva/sclera: Conjunctivae normal.  ?   Pupils: Pupils are equal, round, and reactive to light.  ?Cardiovascular:  ?   Rate and Rhythm: Regular rhythm. Tachycardia present.  ?   Pulses: Normal pulses.  ?   Heart sounds: Normal heart sounds.  ?Pulmonary:  ?   Effort: Pulmonary effort is normal.  ?   Breath sounds: Normal breath sounds.  ?Neurological:  ?   General: No focal deficit present.  ?   Mental Status: He is alert and oriented to person, place, and time. Mental status is at baseline.  ?Psychiatric:     ?   Mood and Affect: Mood normal.     ?   Behavior: Behavior normal.     ?   Thought Content: Thought content normal.     ?   Judgment: Judgment normal.  ? ? ?BP 112/78 (BP  Location: Left Arm, Patient Position: Sitting, Cuff Size: Large)   Pulse (!) 107   Temp 97.8 ?F (36.6 ?C) (Oral)   Resp 16   Ht 6' (1.829 m)   [facility]  187 lb 14.4 oz (85.2 kg)   SpO2 98%   BMI 25.48 kg/m?  ?Wt Readings from Last 3 Encounters:  ?02/17/22 187 lb 14.4 oz (85.2 kg)  ?01/20/22 188 lb (85.3 kg)  ?12/27/21 194 lb 0.1 oz (88 kg)  ? ? ? ?Health Maintenance Due  ?Topic Date Due  ? COVID-19 Vaccine (1) Never done  ? Hepatitis C Screening  Never done  ? Zoster Vaccines- Shingrix (1 of 2) Never done  ? COLONOSCOPY (Pts 45-7yr Insurance coverage will need to be confirmed)  05/29/2021  ? ? ?There are no preventive care reminders to display for this patient. ? ?Lab Results  ?Component Value Date  ? TSH 0.052 (L) 11/29/2020  ? ?Lab Results  ?Component Value Date  ? WBC 8.2 11/21/2021  ? HGB 13.8 11/21/2021  ? HCT 42.9 11/21/2021  ? MCV 85.3 11/21/2021  ? PLT 230 11/21/2021  ? ?Lab Results  ?Component Value Date  ? NA 136 11/21/2021  ? K 4.1 11/21/2021  ? CO2 26 11/21/2021  ? GLUCOSE 99 11/21/2021  ? BUN 12 11/21/2021  ? CREATININE 1.17 11/21/2021  ? BILITOT 1.6 (H) 11/21/2021  ? ALKPHOS 67 11/21/2021  ? AST 20 11/21/2021  ? ALT 24 11/21/2021  ? PROT 7.6 11/21/2021  ? ALBUMIN 4.5 11/21/2021  ? CALCIUM 9.4 11/21/2021  ? ANIONGAP 7 11/21/2021  ? ?Lab Results  ?Component Value Date  ? CHOL 155 05/22/2020  ? ?Lab Results  ?Component Value Date  ? HDL 65 05/22/2020  ? ?Lab Results  ?Component Value Date  ? LHendricks73 05/22/2020  ? ?Lab Results  ?Component Value Date  ? TRIG 92 05/22/2020  ? ?Lab Results  ?Component Value Date  ? CHOLHDL 2.4 05/22/2020  ? ?Lab Results  ?Component Value Date  ? HGBA1C 5.2 11/29/2020  ? ? ?  ?Assessment & Plan:  ? ?1. Essential hypertension ?-His blood pressure is under control, he will continue on current medication, DASH diet and exercise as tolerated. ?- lisinopril (ZESTRIL) 40 MG tablet; TAKE ONE TABLET BY MOUTH ONCE EVERY DAY.  Dispense: 90 tablet; Refill: 0 ?- metoprolol tartrate  (LOPRESSOR) 25 MG tablet; Take 1 tablet (25 mg total) by mouth once daily.  Dispense: 30 tablet; Refill: 3 ? ?2. Generalized anxiety disorder ?-He states his anxiety is under control and he will continue on current med

## 2022-02-18 ENCOUNTER — Other Ambulatory Visit: Payer: Self-pay

## 2022-02-19 ENCOUNTER — Other Ambulatory Visit: Payer: Self-pay

## 2022-02-23 ENCOUNTER — Other Ambulatory Visit: Payer: Self-pay

## 2022-02-25 ENCOUNTER — Ambulatory Visit: Payer: Self-pay | Admitting: Licensed Clinical Social Worker

## 2022-02-25 ENCOUNTER — Ambulatory Visit: Payer: Medicaid Other | Admitting: Licensed Clinical Social Worker

## 2022-02-25 DIAGNOSIS — F411 Generalized anxiety disorder: Secondary | ICD-10-CM

## 2022-02-25 NOTE — BH Specialist Note (Signed)
Integrated Behavioral Health via Telemedicine Visit ? ?02/25/2022 ?Miguel [name] ?[id number] ? ? ?Referring Provider: Elizabeth Chioma, NP ?Patient/Family location: The patient's home ?[facility] Provider location: [facility] ?All persons participating in visit: Miguel [name] and  , LCSW-A ?Types of Service: Telephone visit ? ?I connected with Miguel [name] via  Telephone or Video Enabled Telemedicine Application  (Video is Caregility application) and verified that I am speaking with the correct person using two identifiers. Discussed confidentiality: Yes  ? ?I discussed the limitations of telemedicine and the availability of in person appointments.  Discussed there is a possibility of technology failure and discussed alternative modes of communication if that failure occurs. ? ?Patient and/or legal guardian expressed understanding and consented to Telemedicine visit: Yes  ? ?Presenting Concerns: ?Patient and/or family reports the following symptoms/concerns: The patient reports that he has been doing well since his last follow up appointment. He noted that his ceiling in his bathroom is leaking and his kitchen sink is not draining well. He noted that he is struggling with his landlord to have repairs made. He noted that his landlord raised his rent the last time he made any repairs. [name] stated that he continues to live sober and is proud of himself for not going back to drinking when things are tough. He shared that his wife tells him she is proud of him as well. The patient denied any suicidal or homicidal thoughts. ?Duration of problem: Years; Severity of problem: moderate ? ?Patient and/or Family's Strengths/Protective Factors: ?Concrete supports in place (healthy food, safe environments, etc.) and Sense of purpose ? ?Goals Addressed: ?Patient will: ? Reduce symptoms of: agitation, anxiety, depression, insomnia, and stress  ? Increase knowledge and/or ability of: coping skills, healthy  habits, self-management skills, and stress reduction  ? Demonstrate ability to: Increase healthy adjustment to current life circumstances and Increase motivation to adhere to plan of care ? ?Progress towards Goals: ?Ongoing ? ?Interventions: ?Interventions utilized:  CBT Cognitive Behavioral Therapywas utilized by the clinician during today's follow up session. Clinician met with patient to identify needs related to stressors and functioning, and assess and monitor for signs and symptoms of anxiety and depression, and assess safety. The clinician processed with the patient how they have been doing since the last follow-up session. Clinician measured the patient's anxiety and depression on a numerical scale. The clinician  congratulated the patient on his progress  and encouraged him to continue to utilize their coping skills to deal with their current life circumstances. The session ended with scheduling. ?Standardized Assessments completed: GAD-7 and PHQ 9 ?GAD-7= 14 ?PHQ-9= 01 ? ?Assessment: ?Patient currently experiencing see above.  ? ?Patient may benefit from see above. ? ?Plan: ?Follow up with behavioral health clinician on : 03/17/2022 at 12:00  noon ?Behavioral recommendations:  ?Referral(s): Integrated Behavioral Health Services (In Clinic) ? ?I discussed the assessment and treatment plan with the patient and/or parent/guardian. They were provided an opportunity to ask questions and all were answered. They agreed with the plan and demonstrated an understanding of the instructions. ?  ?They were advised to call back or seek an in-person evaluation if the symptoms worsen or if the condition fails to improve as anticipated. ? ? J , LCSWA ?

## 2022-03-04 ENCOUNTER — Other Ambulatory Visit: Payer: Self-pay | Admitting: Gerontology

## 2022-03-04 ENCOUNTER — Other Ambulatory Visit: Payer: Self-pay

## 2022-03-04 DIAGNOSIS — R0602 Shortness of breath: Secondary | ICD-10-CM

## 2022-03-04 MED FILL — Albuterol Sulfate Inhal Aero 108 MCG/ACT (90MCG Base Equiv): RESPIRATORY_TRACT | Qty: 17 | Fill #0 | Status: CN

## 2022-03-05 ENCOUNTER — Other Ambulatory Visit: Payer: Self-pay

## 2022-03-10 ENCOUNTER — Ambulatory Visit: Payer: Medicaid Other | Admitting: Licensed Clinical Social Worker

## 2022-03-11 IMAGING — CR DG RIBS W/ CHEST 3+V*R*
1 series · 5 of 5 positions shown · non-contrast
Comparison: March 05, 2021

CLINICAL DATA: Status post fall.

EXAM:
RIGHT RIBS AND CHEST - 3+ VIEW

[Series 1: dg ribs unilateral w/chest right · 0.14mm/px · 5 of 5 slices shown]
[im 1/5]
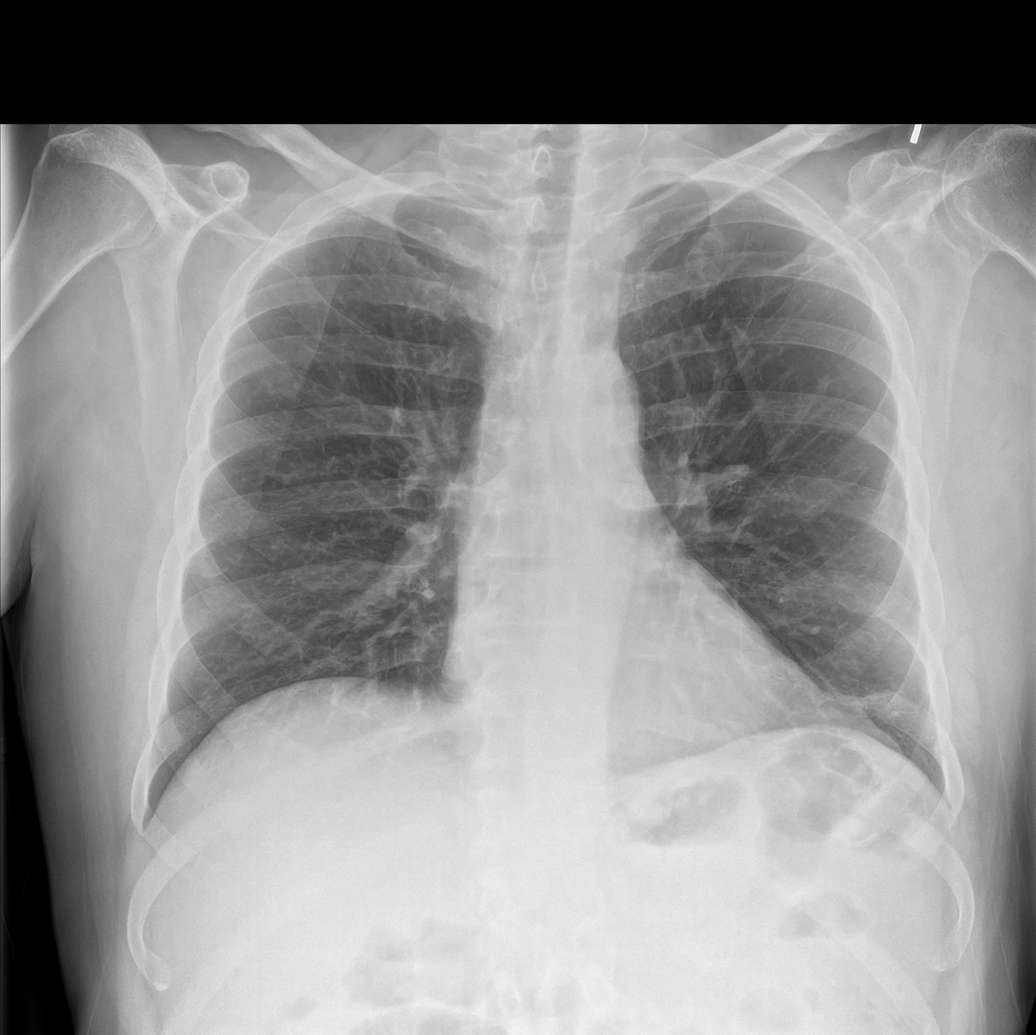
[im 2/5]
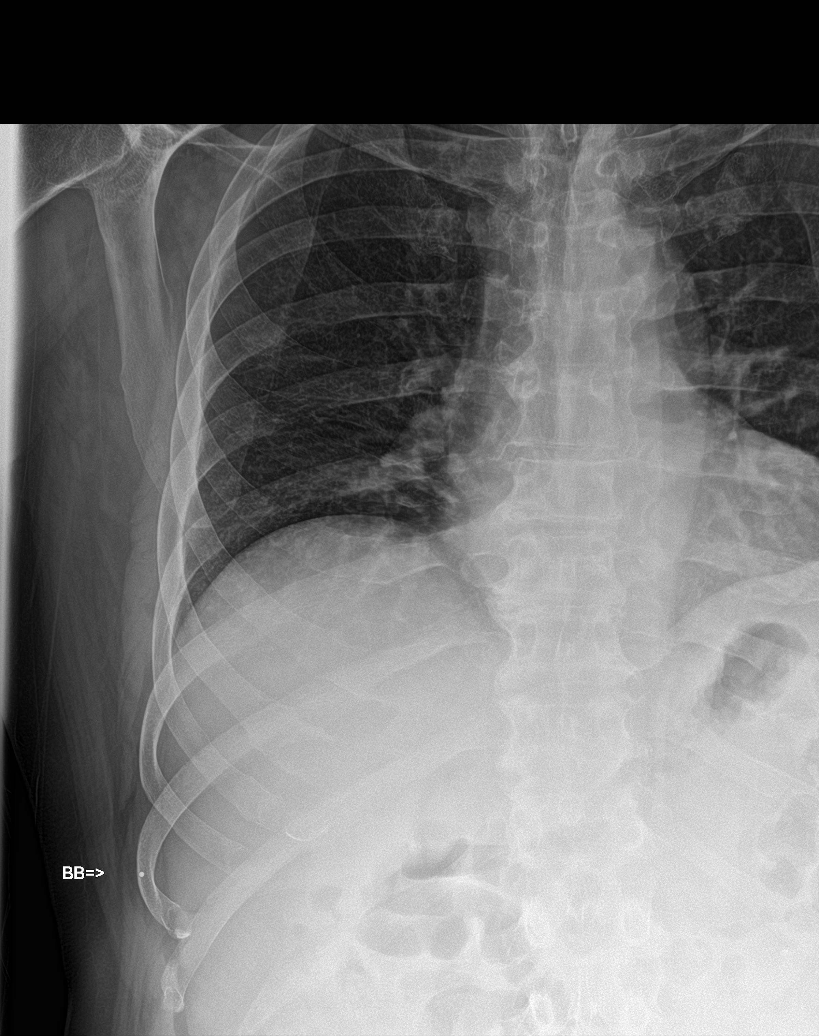
[im 3/5]
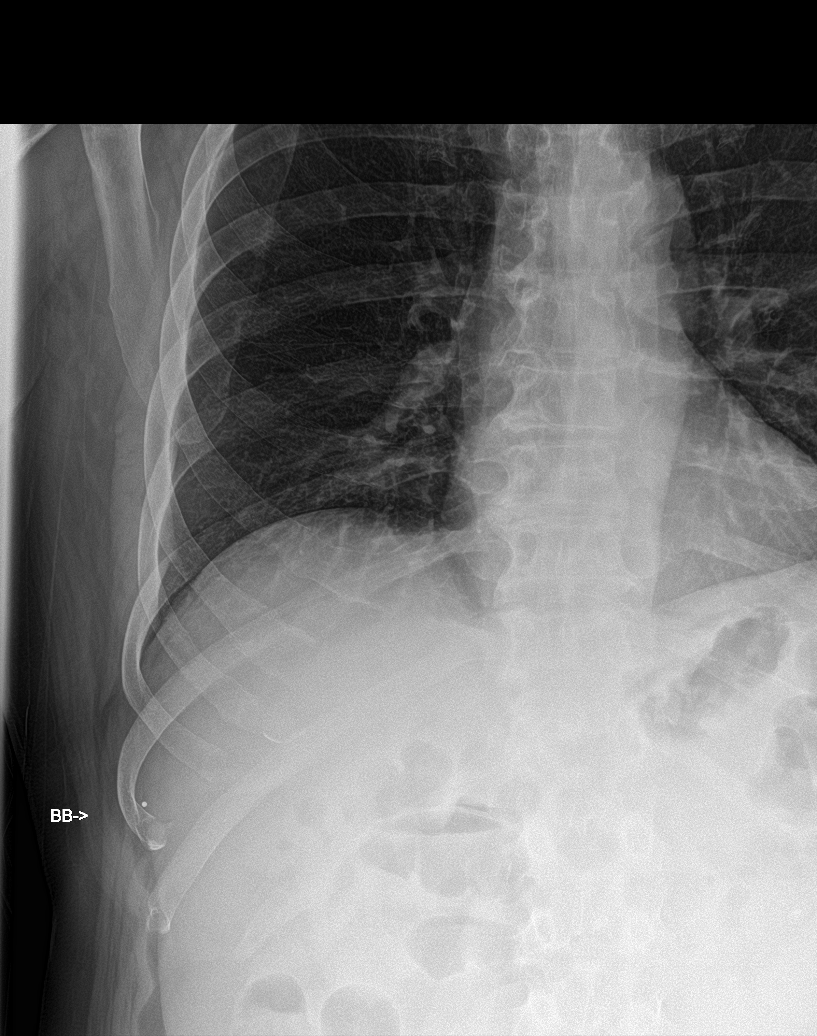
[im 4/5]
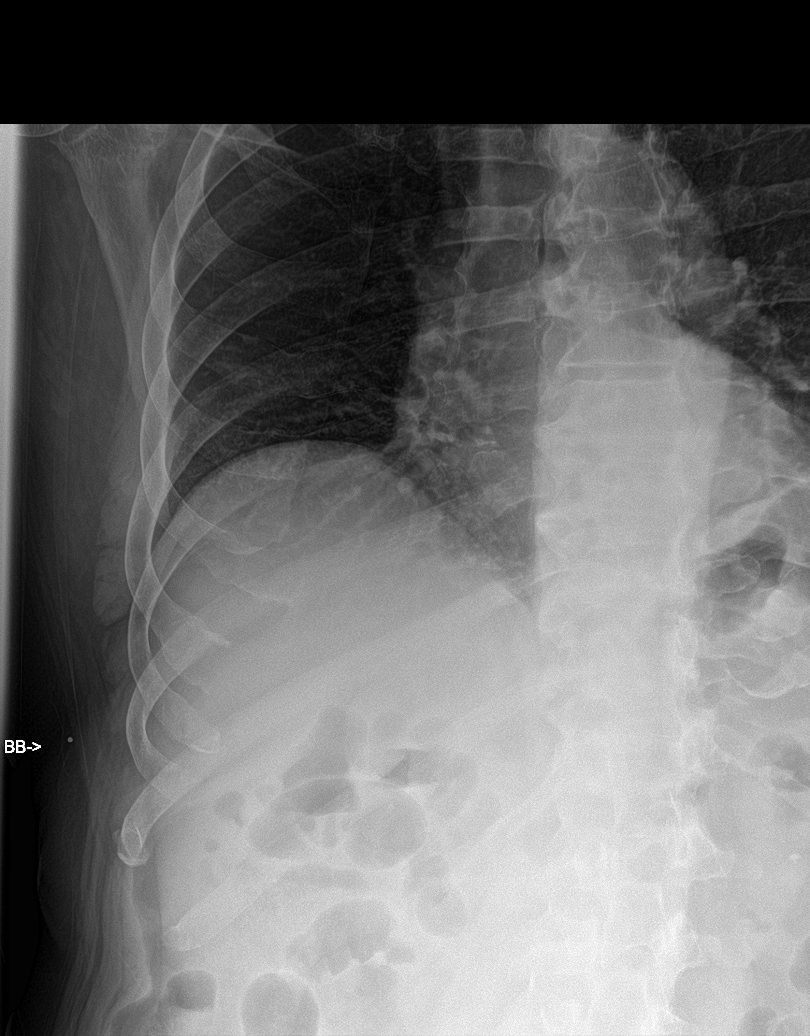
[im 5/5]
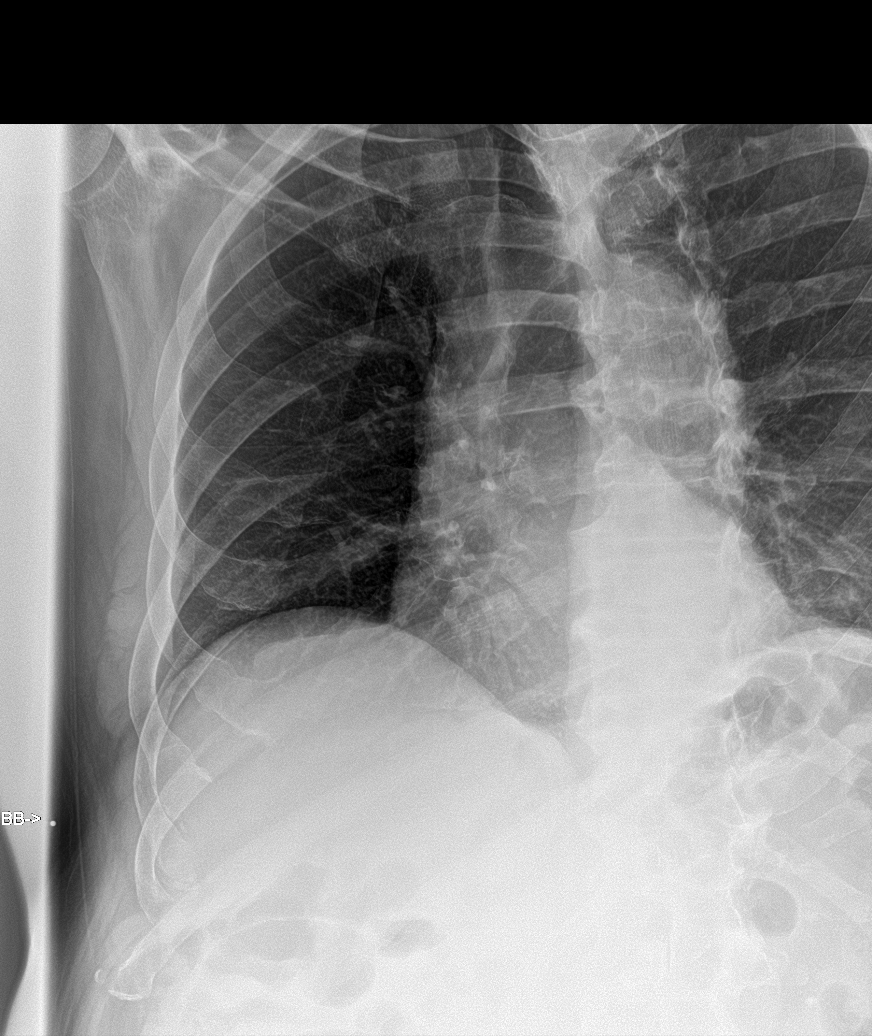

[5 of 5 positions shown; findings below may reference images not displayed]

FINDINGS: A radiopaque marker was placed at the site of the patient's pain.
Acute fracture is seen along the anterior tip of the tenth right
rib. There is no evidence of pneumothorax or pleural effusion. Mild
atelectasis is seen within the left lung base. Heart size and
mediastinal contours are within normal limits.
IMPRESSION: Acute tenth right rib fracture.

## 2022-03-12 ENCOUNTER — Other Ambulatory Visit: Payer: Self-pay

## 2022-03-17 ENCOUNTER — Ambulatory Visit: Payer: Self-pay | Admitting: Licensed Clinical Social Worker

## 2022-03-17 ENCOUNTER — Ambulatory Visit: Payer: Medicaid Other | Admitting: Licensed Clinical Social Worker

## 2022-03-17 DIAGNOSIS — F411 Generalized anxiety disorder: Secondary | ICD-10-CM

## 2022-03-17 DIAGNOSIS — F1011 Alcohol abuse, in remission: Secondary | ICD-10-CM

## 2022-03-17 NOTE — BH Specialist Note (Signed)
Integrated Behavioral Health via Telemedicine Visit ? ?03/17/2022 ?Miguel [name] ?[id number] ? ? ?Referring Provider: Carlyon Shadow, NP  ?Patient/Family location: The patient's home address ?[facility] Provider location: [facility] [place] ?All persons participating in visit: Sotirios Navarro. Jimmerson and [name], LCSW-A ?Types of Service: Telephone visit ? ?I connected with [name] via  Telephone or Video Enabled Telemedicine Application  (Video is Caregility application) and verified that I am speaking with the correct person using two identifiers. Discussed confidentiality: Yes  ? ?I discussed the limitations of telemedicine and the availability of in person appointments.  Discussed there is a possibility of technology failure and discussed alternative modes of communication if that failure occurs. ? ?Patient and/or legal guardian expressed understanding and consented to Telemedicine visit: Yes  ? ?Presenting Concerns: ?Patient and/or family reports the following symptoms/concerns: The patient reports that he has been doing well since his last follow-up appointment. He noted that he went shopping and bought himself some clothes and groceries. He shared that he has been enjoying working on his car. He stated that he went to [facility] and visited with his wife yesterday and brought her supplies and money for her outing. He said he struggles with missing teeth and dental issues that make chewing food difficult. He explained that he has to have a soft diet but craves other foods, making him feel bad that he can not enjoy what he used to eat. The patient noted that he has been feeling fatigued after walking and short of breath, which he attributed to taking hydroxyzine for his anxiety. He stated that he stopped taking the medication for three days to see if the pills were making him sleepy. The patient said he didn't sleep well at night and restarted the medication again two days ago, but only as needed. He  noted that he did not notice an increase in anxiety or nervousness when he stopped his medication. The patient discussed his progress remaining sober for the last six months. He noted that he is looking forward to his wife returning home as soon as next month. Macio denied any suicidal or homicidal thoughts. Duration of problem: Years; Severity of problem: moderate ? ?Patient and/or Family's Strengths/Protective Factors: ?Concrete supports in place (healthy food, safe environments, etc.) and Sense of purpose ? ?Goals Addressed: ?Patient will: ? Reduce symptoms of: agitation, anxiety, depression, insomnia, and stress  ? Increase knowledge and/or ability of: coping skills, healthy habits, self-management skills, and stress reduction  ? Demonstrate ability to: Increase healthy adjustment to current life circumstances and Increase adequate support systems for patient/family ? ?Progress towards Goals: ?Ongoing ? ?Interventions: ?Interventions utilized:  CBT Cognitive Behavioral Therapy was utilized by the clinician during today's follow up session. Clinician met with patient to identify needs related to stressors and functioning, and assess and monitor for signs and symptoms of anxiety and depression, and assess safety. The clinician processed with the patient how they have been doing since the last follow-up session. Clinician measured the patient's anxiety and depression on numerical scale.  Clinician provided a safe judgement free space for the patient to ventilate his frustrations regarding his current life circumstances. Clinician congratulated the patient on his progress and sobriety. Clinician encouraged the client to continue to focus on self care and using his coping skills to deal with his current life circumstances. The session ended in scheduling.  ?Standardized Assessments completed: GAD-7 and PHQ 9 ?GAD-7= 15 ?PHQ-9= 02 ? ?Patient and/or Family Response: 03/26/2022 at 11:00  PM  ? ?Assessment: ?Patient  currently experiencing see above.  ? ?Patient may benefit from see above. ? ?Plan: ?Follow up with behavioral health clinician on : 03/26/2022 at 12:00 Noon.  ?Behavioral recommendations:  ?Referral(s): [place] (In Clinic) ? ?I discussed the assessment and treatment plan with the patient and/or parent/guardian. They were provided an opportunity to ask questions and all were answered. They agreed with the plan and demonstrated an understanding of the instructions. ?  ?They were advised to call back or seek an in-person evaluation if the symptoms worsen or if the condition fails to improve as anticipated. ? ?Lesli Albee, LCSWA ?

## 2022-03-18 IMAGING — CR DG CHEST 2V
1 series · 2 of 2 positions shown · non-contrast
Comparison: 06/10/2021

CLINICAL DATA: Throat pain

EXAM:
CHEST - 2 VIEW

[Series 1: dg chest port 1 view · 0.14mm/px · 2 of 2 slices shown]
[im 1/2]
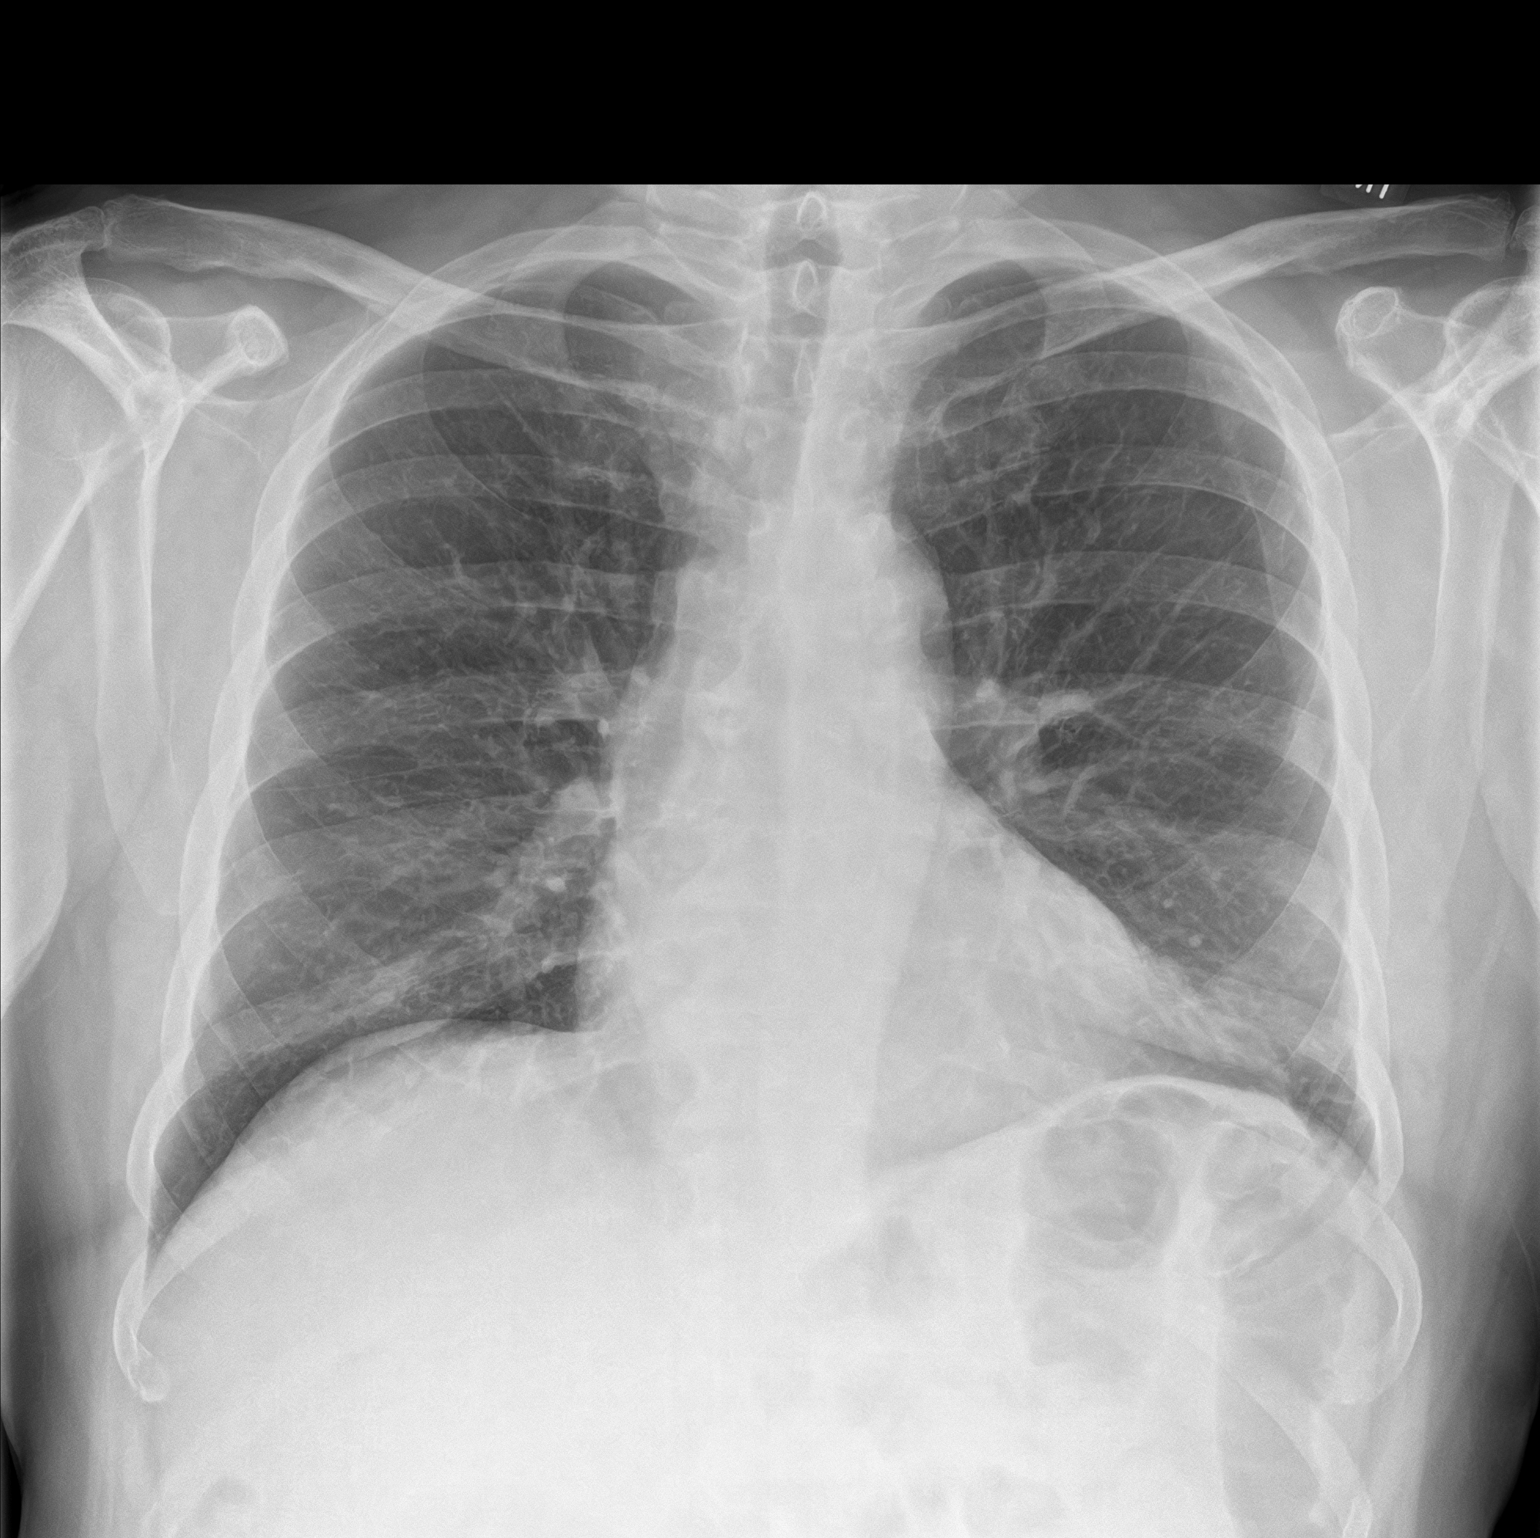
[im 2/2]
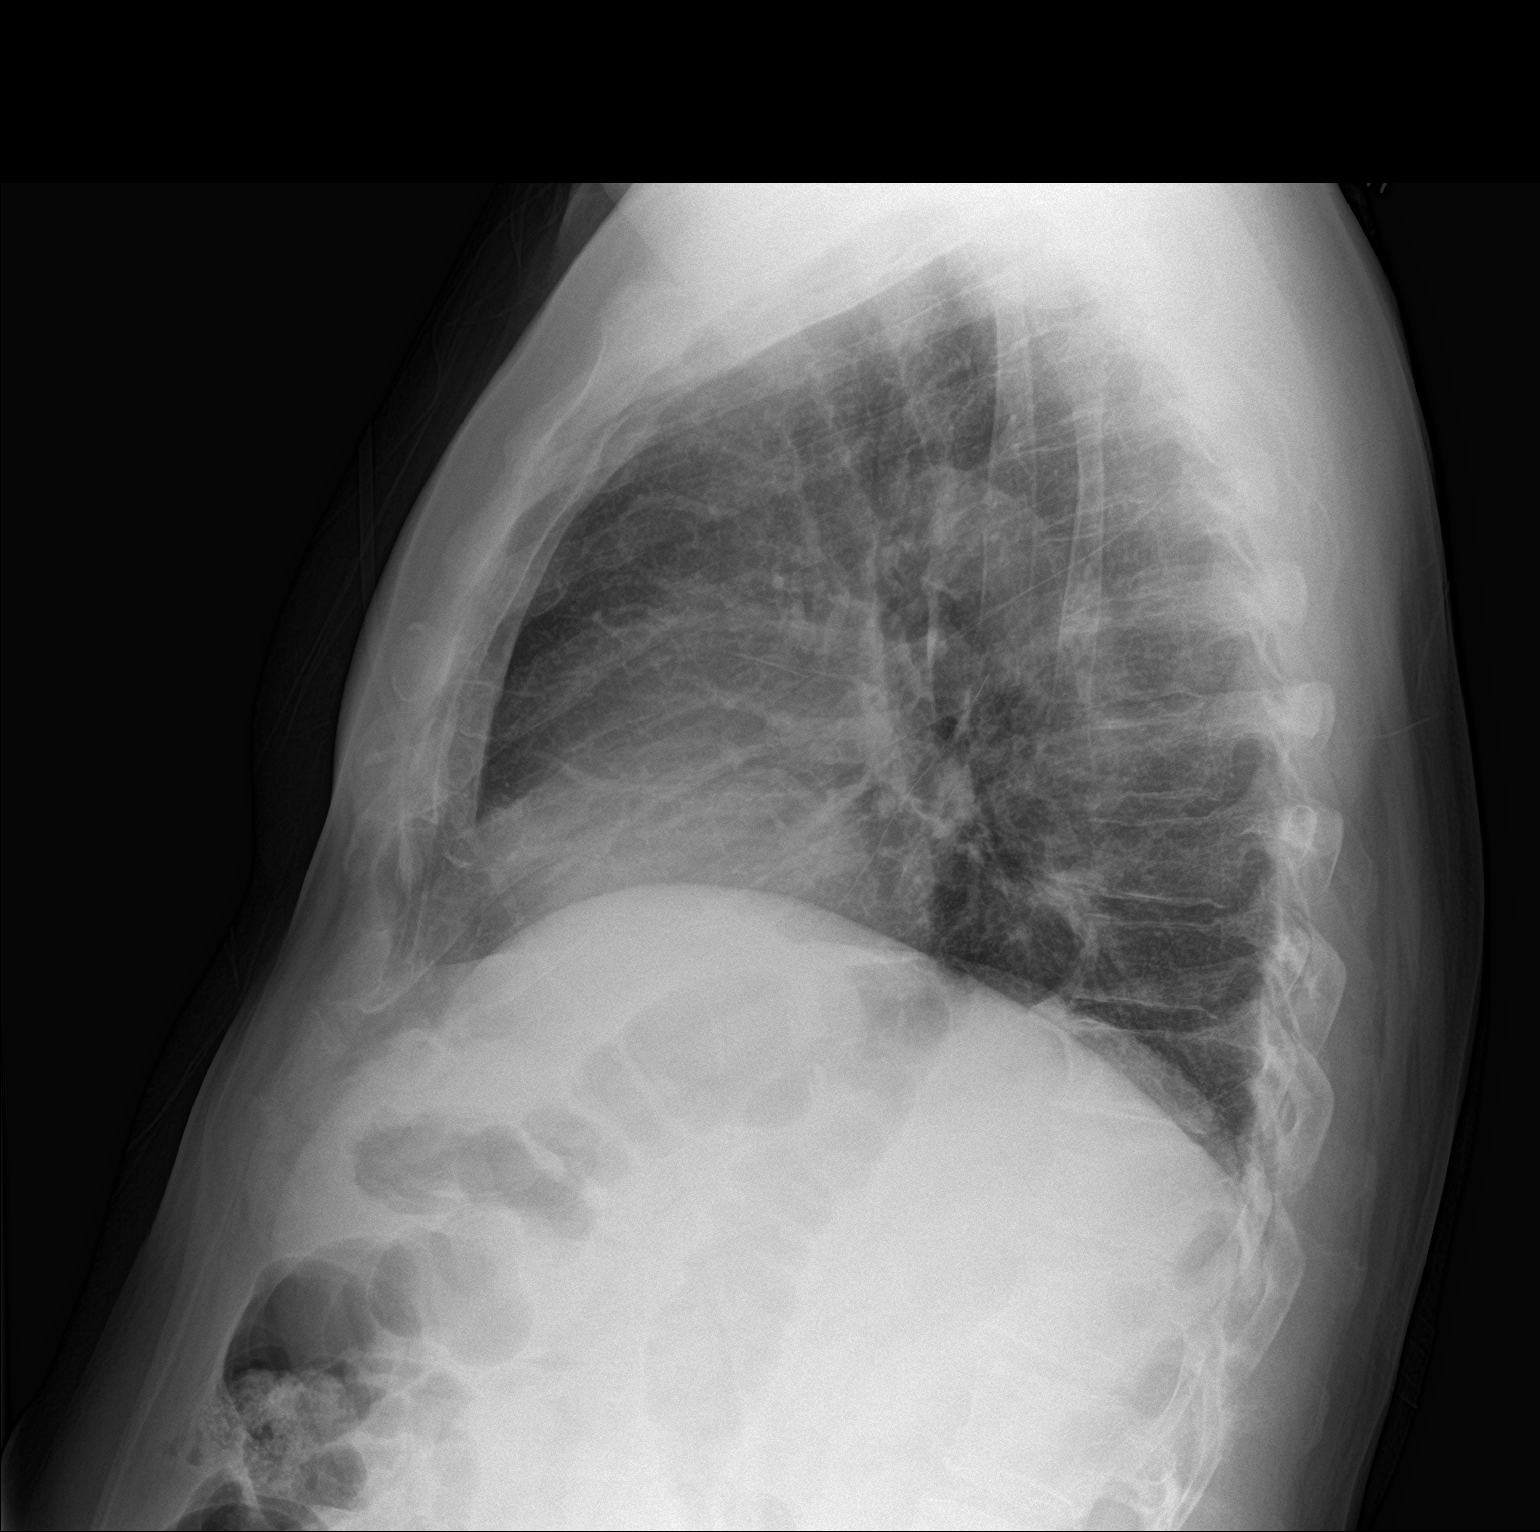

[2 of 2 positions shown; findings below may reference images not displayed]

FINDINGS: The heart size and mediastinal contours are within normal limits.
Both lungs are clear. Atelectasis or scarring at the lingula. The
visualized skeletal structures are unremarkable.
IMPRESSION: No active cardiopulmonary disease.

## 2022-03-19 ENCOUNTER — Ambulatory Visit: Payer: Medicaid Other | Admitting: Gerontology

## 2022-03-19 ENCOUNTER — Other Ambulatory Visit: Payer: Self-pay

## 2022-03-20 ENCOUNTER — Other Ambulatory Visit: Payer: Self-pay

## 2022-03-26 ENCOUNTER — Ambulatory Visit: Payer: Self-pay | Admitting: Licensed Clinical Social Worker

## 2022-03-26 ENCOUNTER — Ambulatory Visit: Payer: Medicaid Other | Admitting: Licensed Clinical Social Worker

## 2022-03-26 DIAGNOSIS — F411 Generalized anxiety disorder: Secondary | ICD-10-CM

## 2022-03-26 DIAGNOSIS — F1011 Alcohol abuse, in remission: Secondary | ICD-10-CM

## 2022-03-26 NOTE — BH Specialist Note (Signed)
Integrated Behavioral Health via Telemedicine Visit ? ?03/26/2022 ?ARCH METHOT ?[id number] ? ?Referring Provider: Carlyon Shadow, NP  ?Patient/Family location: The patient's home ?[facility] Provider location: [facility] [place]  ?All persons participating in visit: Miguel [name] and Miguel Cairo, LCSW-A ?Types of Service: Telephone visit ? ?I connected with Miguel [name] via  Telephone or Video Enabled Telemedicine Application  (Video is Caregility application) and verified that I am speaking with the correct person using two identifiers. Discussed confidentiality: Yes  ? ?I discussed the limitations of telemedicine and the availability of in person appointments.  Discussed there is a possibility of technology failure and discussed alternative modes of communication if that failure occurs. ? ?Patient and/or legal guardian expressed understanding and consented to Telemedicine visit: Yes  ? ?Presenting Concerns: ?Patient and/or family reports the following symptoms/concerns: The patient reports that he has been doing good since his last follow-up appointment. He noted that he continues to live sober and is proud of his progress. He shared that he is staying busy and has been working on his car a lot lately which he enjoys. He noted that he has been shopping this week and sent his wife a package for Mother's Day to surprise her. Miguel [name] discussed familial stressors present in his life. He shared that he is concerned about his brother's and mother du to tensions rising in the family. However, he shared that he is aware that is out of his control so he tries to stay out of their disagreements. The patient explained that he has been trying to walk more for exercise and drinking more water this week. He reported that overall he continues to do well. Miguel [name] denied any suicidal or homicidal thoughts.   ?Duration of problem: Years; Severity of problem: moderate ? ?Patient and/or Family's Strengths/Protective  Factors: ?Concrete supports in place (healthy food, safe environments, etc.) and Sense of purpose ? ?Goals Addressed: ?Patient will: ? Reduce symptoms of: agitation, anxiety, depression, and stress  ? Increase knowledge and/or ability of: coping skills, healthy habits, self-management skills, and stress reduction  ? Demonstrate ability to: Increase healthy adjustment to current life circumstances, Increase adequate support systems for patient/family, and Decrease self-medicating behaviors ? ?Progress towards Goals: ?Ongoing ? ?Interventions: ?Interventions utilized:  CBT Cognitive Behavioral Therapy was utilized by the clinician during today's follow up session. Clinician met with patient to identify needs related to stressors and functioning, and assess and monitor for signs and symptoms of anxiety and depression, and assess safety. The clinician processed with the patient how they have been doing since the last follow-up session. Clinician measured the patient's anxiety and depression on a numerical scale. Clinician informed the patient that Medication Management is moving and changing their name to Miguel [name] and reassured him that he will still receive the same level of services as before. Clinician shared information with the patient regarding Medicaid expansion in [place]. Clinician provided a safe space for the client to vent his frustrations regarding his current life circumstances. Clinician congratulated the patient on remaining focused on the positives in his life verses the negatives. Clinician encouraged the client to continue to use his coping skills to deal with his current life circumstances. The session ended with scheduling.  ?Standardized Assessments completed: GAD-7 and PHQ 9 ?GAD-7= 13 ?PHQ-9= 05 ? ?Assessment: ?Patient currently experiencing see above.  ? ?Patient may benefit from see above. ? ?Plan: ?Follow up with behavioral health clinician on : 04/02/2022 at 2:00  PM ?Behavioral  recommendations:  ?Referral(s): [place] (In Clinic) ? ?I discussed the assessment and treatment plan with the patient and/or parent/guardian. They were provided an opportunity to ask questions and all were answered. They agreed with the plan and demonstrated an understanding of the instructions. ?  ?They were advised to call back or seek an in-person evaluation if the symptoms worsen or if the condition fails to improve as anticipated. ? ?Miguel [name], LCSWA ?

## 2022-03-29 IMAGING — CT CT ABD-PELV W/ CM
2 of 5 series · 16 of 46 positions shown, 18 images · IV contrast (APPLIED)
Comparison: 06/23/2021

CLINICAL DATA: Acute abdominal pain for several days, initial
encounter

EXAM:
CT ABDOMEN AND PELVIS WITH CONTRAST
TECHNIQUE: Multidetector CT imaging of the abdomen and pelvis was performed
using the standard protocol following bolus administration of
intravenous contrast.
CONTRAST:  75mL OMNIPAQUE IOHEXOL 350 MG/ML SOLN

[Series 2: routine abd/pel with · axial · 0.84mm/px · z∈[-1143,-663]mm · 13 of 108 slices shown, 15 images]
[im 6/108  soft-tissue]
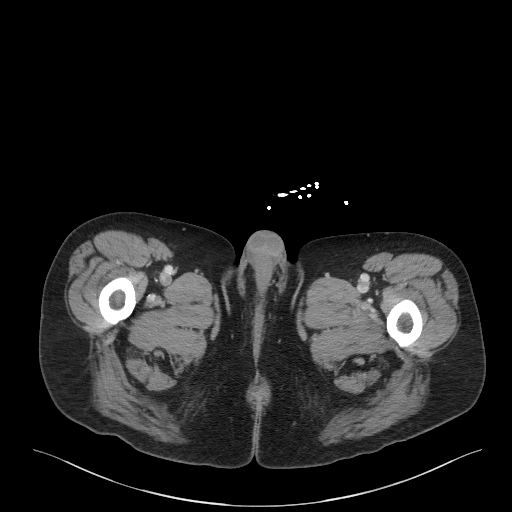
[im 6/108  bone]
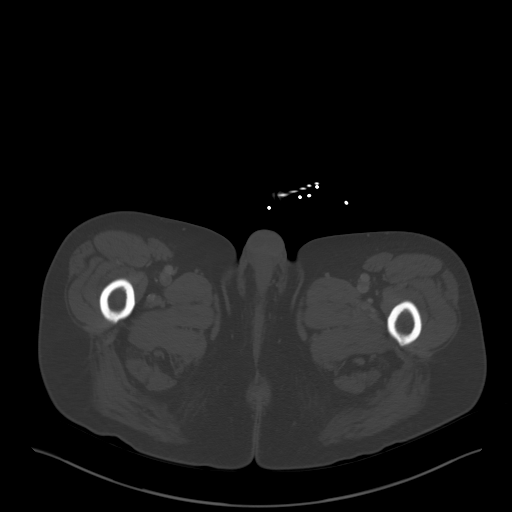
[im 17/108  soft-tissue]
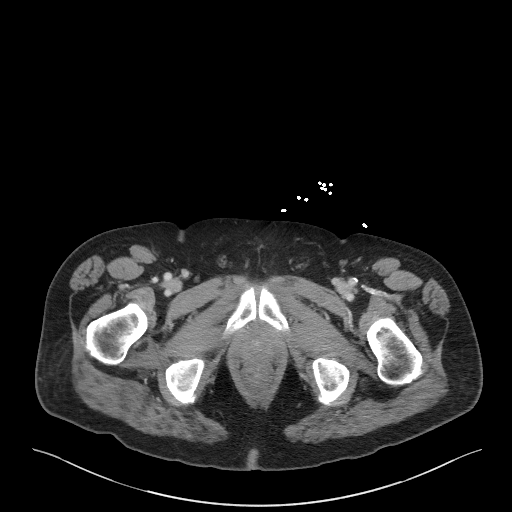
[im 23/108  soft-tissue]
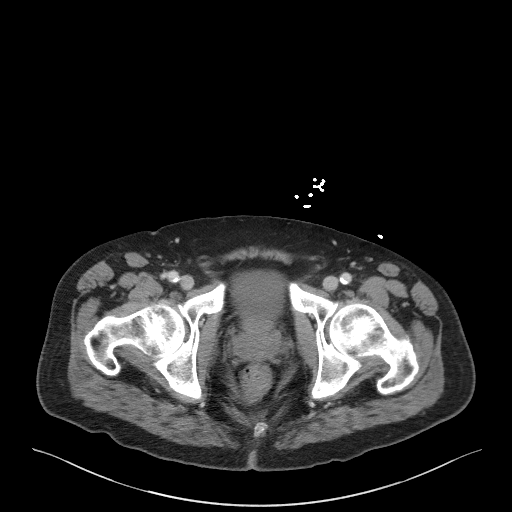
[im 29/108  soft-tissue]
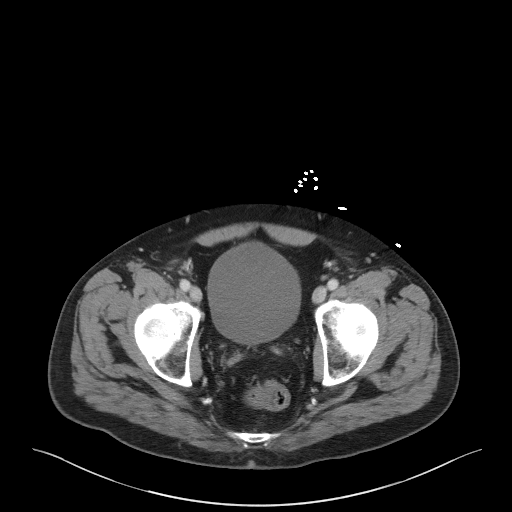
[im 40/108  soft-tissue]
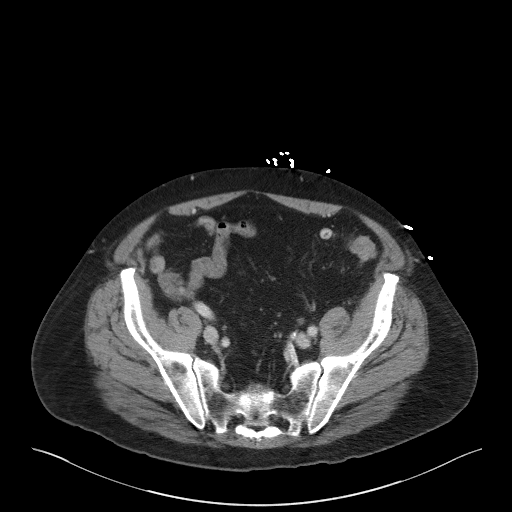
[im 46/108  soft-tissue]
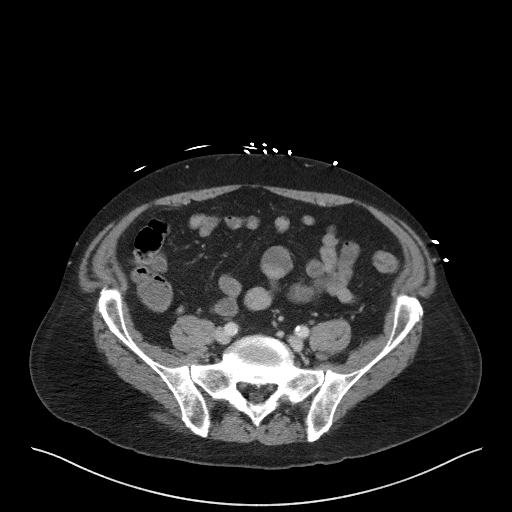
[im 57/108  soft-tissue]
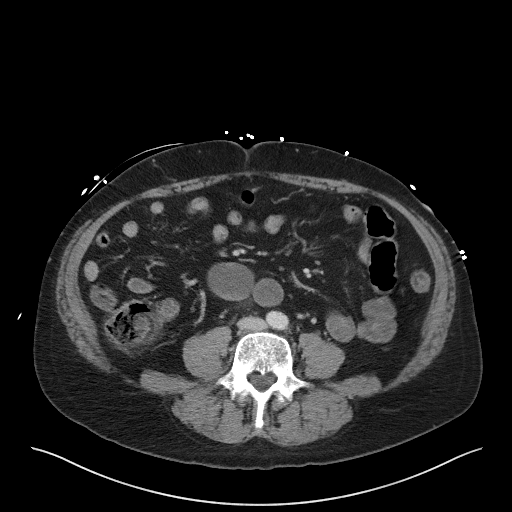
[im 62/108  soft-tissue]
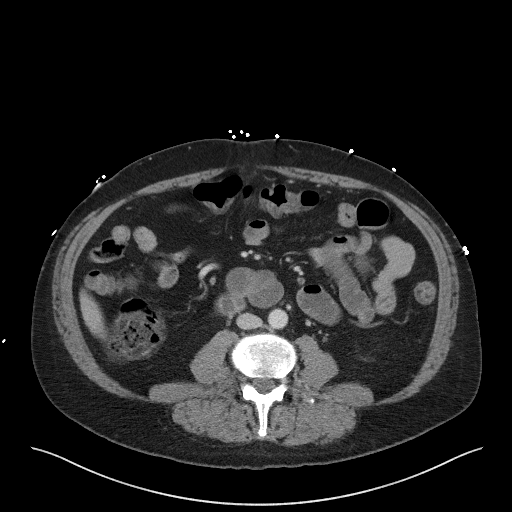
[im 68/108  soft-tissue]
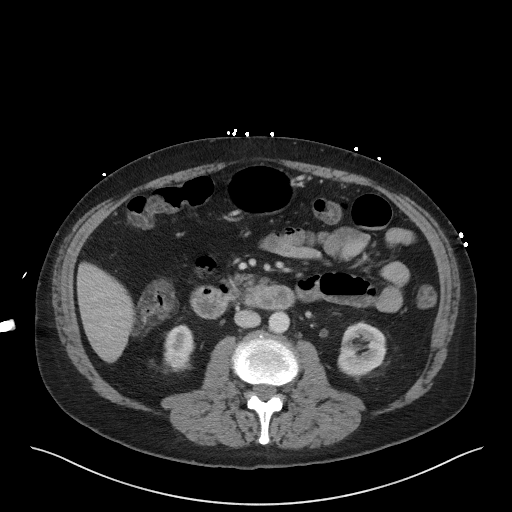
[im 68/108  bone]
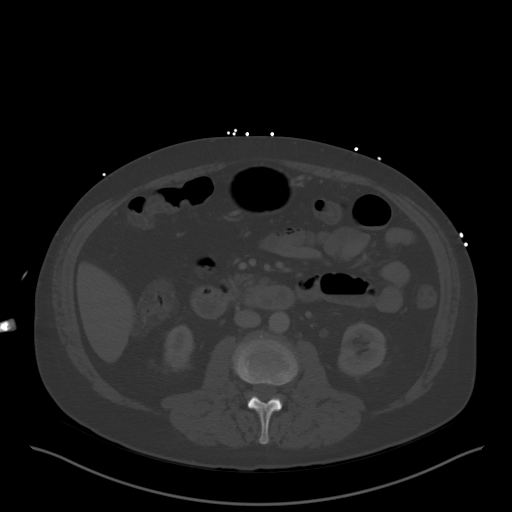
[im 79/108  soft-tissue]
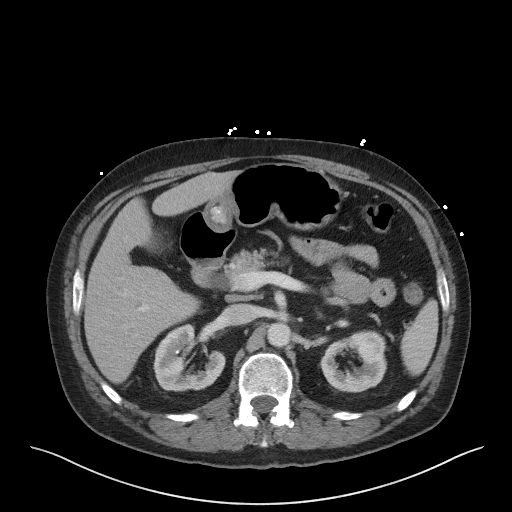
[im 85/108  soft-tissue]
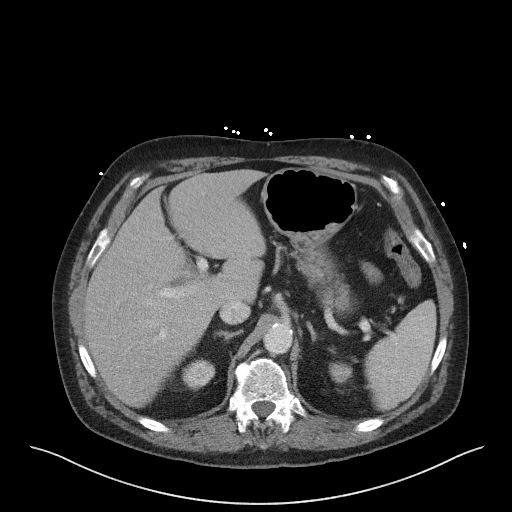
[im 91/108  soft-tissue]
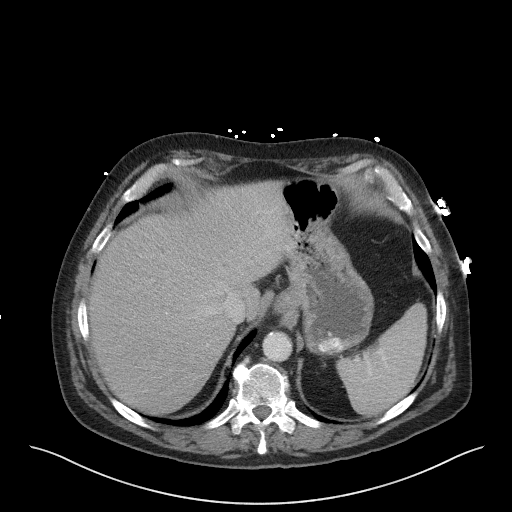
[im 102/108  soft-tissue]
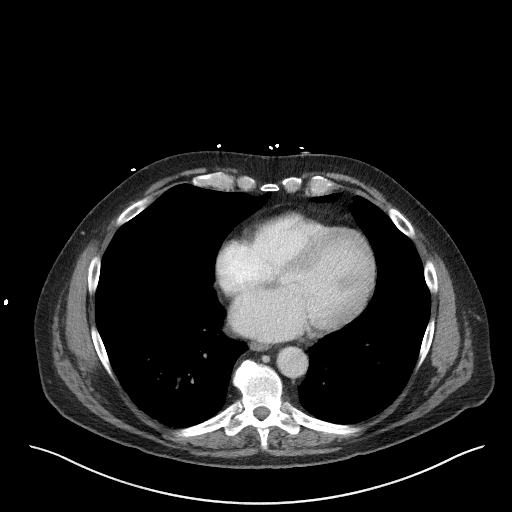

[Series 5: coronal st · coronal · 0.76mm/px · 3 of 98 slices shown]
[im 33/98  soft-tissue]
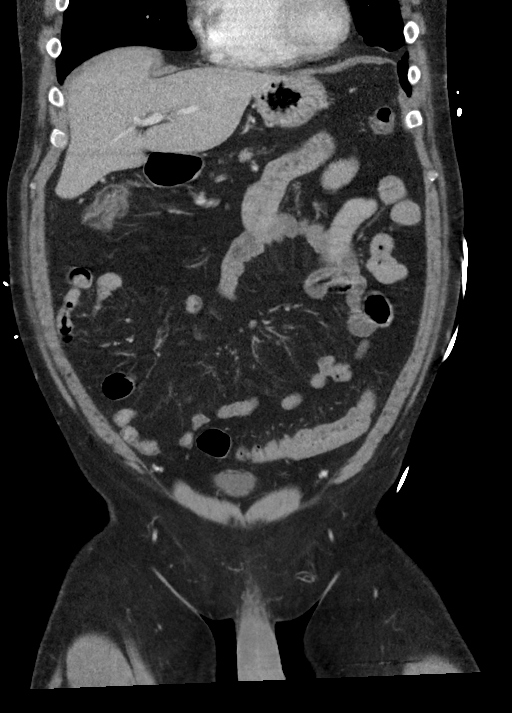
[im 44/98  soft-tissue]
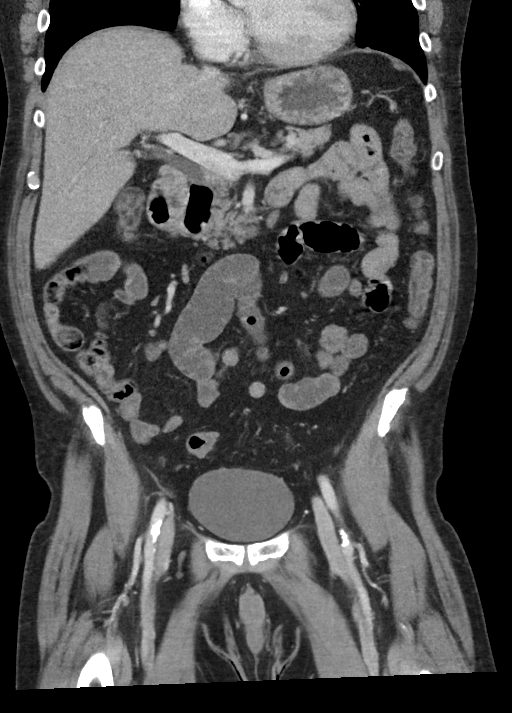
[im 54/98  soft-tissue]
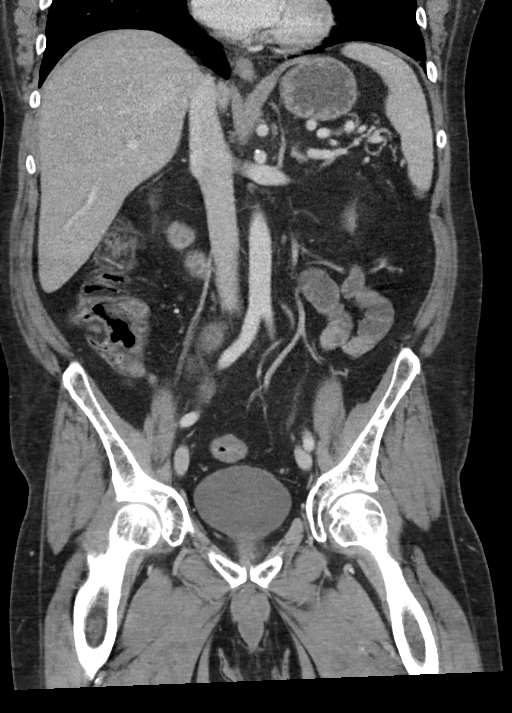

[16 of 46 positions shown; findings below may reference images not displayed]

FINDINGS: Lower chest: No acute abnormality.

Hepatobiliary: Diffuse decreased attenuation of the liver is noted
consistent with fatty infiltration. The gallbladder has been
surgically removed.

Pancreas: Unremarkable. No pancreatic ductal dilatation or
surrounding inflammatory changes.

Spleen: Normal in size without focal abnormality.

Adrenals/Urinary Tract: Adrenal glands are within normal limits.
Kidneys again demonstrate bilateral renal calculi without
obstructive change. The overall appearance is similar to that seen
on the prior exam. No ureteral stones are seen. The bladder is
partially distended.

Stomach/Bowel: Scattered diverticular change of the sigmoid colon is
noted. No diverticulitis is seen. The colon is otherwise
decompressed without inflammatory change. The appendix is within
normal limits. Small bowel and stomach are unremarkable with the
exception of a few duodenal diverticula adjacent to the head of the
pancreas.

Vascular/Lymphatic: Aortic atherosclerosis. No enlarged abdominal or
pelvic lymph nodes.

Reproductive: Prostate is unremarkable.

Other: No abdominal wall hernia or abnormality. No abdominopelvic
ascites.

Musculoskeletal: Mild degenerative changes of lumbar spine are
noted.
IMPRESSION: Bilateral nonobstructing renal calculi stable from the exam 5 days
previous.

Fatty liver.

Diverticulosis without diverticulitis.

No acute abnormality is noted.

## 2022-04-01 ENCOUNTER — Other Ambulatory Visit: Payer: Self-pay | Admitting: Gerontology

## 2022-04-01 ENCOUNTER — Other Ambulatory Visit: Payer: Self-pay

## 2022-04-01 DIAGNOSIS — F411 Generalized anxiety disorder: Secondary | ICD-10-CM

## 2022-04-01 MED FILL — Hydroxyzine HCl Tab 25 MG: ORAL | 30 days supply | Qty: 90 | Fill #0 | Status: CN

## 2022-04-02 ENCOUNTER — Other Ambulatory Visit: Payer: Self-pay

## 2022-04-02 ENCOUNTER — Ambulatory Visit: Payer: Self-pay | Admitting: Gerontology

## 2022-04-02 ENCOUNTER — Encounter: Payer: Self-pay | Admitting: Gerontology

## 2022-04-02 ENCOUNTER — Ambulatory Visit: Payer: Medicaid Other | Admitting: Gerontology

## 2022-04-02 ENCOUNTER — Ambulatory Visit: Payer: Medicaid Other | Admitting: Licensed Clinical Social Worker

## 2022-04-02 VITALS — BP 124/77 | HR 99 | Temp 97.7°F | Resp 16 | Ht 72.0 in | Wt 186.6 lb

## 2022-04-02 DIAGNOSIS — F411 Generalized anxiety disorder: Secondary | ICD-10-CM

## 2022-04-02 DIAGNOSIS — F1011 Alcohol abuse, in remission: Secondary | ICD-10-CM

## 2022-04-02 DIAGNOSIS — I158 Other secondary hypertension: Secondary | ICD-10-CM

## 2022-04-02 MED FILL — Hydroxyzine HCl Tab 25 MG: ORAL | 11 days supply | Qty: 33 | Fill #0 | Status: CN

## 2022-04-02 NOTE — BH Specialist Note (Signed)
Integrated Behavioral Health via Telemedicine Visit  04/02/2022 IAAN OREGEL [id number]  Referring Provider: Carlyon Shadow, NP  Patient/Family location: The Patient's home [facility] Provider location: [facility] All persons participating in visit: Dariyon Urquilla. [name] and [name], LCSW-A Types of Service: Telephone visit  I connected with [name] via  Telephone or Video Enabled Telemedicine Application  (Video is Caregility application) and verified that I am speaking with the correct person using two identifiers. Discussed confidentiality: Yes   I discussed the limitations of telemedicine and the availability of in person appointments.  Discussed there is a possibility of technology failure and discussed alternative modes of communication if that failure occurs.  Patient and/or legal guardian expressed understanding and consented to Telemedicine visit: Yes   Presenting Concerns: Patient and/or family reports the following symptoms/concerns: The patient reported that he has been doing okay since his last follow-up appointment. He shared that he woke up at 3:30 am this morning and was unable to fall back to sleep. He shared that he was running late for his appointment and rushed to get there. He noted that once there he had to wait 45 minutes to be seen. The patient explained that put him behind and when he got home he discovered his care was leaking coolant. He discussed financial and other situational stressors impacting his life. He noted that when he called to speak to his mother on Mother's day he felt he was being pulled into family drama between his brother's. Th patient discussed his efforts to remain neutral , but at the same time felt that he needed to help his mother who was caught in the middle. The patient shared that he misses his wife and is looking forward to her coming home next month. He denied any suicidal or homicidal thoughts.  Duration of problem: Years; Severity of  problem: moderate  Patient and/or Family's Strengths/Protective Factors: Concrete supports in place (healthy food, safe environments, etc.) and Sense of purpose  Goals Addressed: Patient will:  Reduce symptoms of: agitation, anxiety, depression, and stress   Increase knowledge and/or ability of: coping skills, healthy habits, self-management skills, and stress reduction   Demonstrate ability to: Increase healthy adjustment to current life circumstances and Increase adequate support systems for patient/family  Progress towards Goals: Ongoing  Interventions: Interventions utilized:  CBT Cognitive Behavioral Therapywas utilized by the clinician during today's follow up session. Clinician met with patient to identify needs related to stressors and functioning, and assess and monitor for signs and symptoms of anxiety and depression, and assess safety. The clinician processed with the patient how they have been doing since the last follow-up session. Clinician measured the patient's anxiety and depression on a numerical scale. Clinician provided a safe judgement free  space for the patient to vent his frustrations regarding his current life circumstances. Clinician encouraged the patient to continue to work on calming techniques (e.g.. paced breathing, deep muscle relaxation, and calming imagery) as a strategy for responding appropriately to anxiety and the urge to avoid situations or self-isolate when they occur and move towards increasing the patients self regulation.The session ended with scheduling.   Standardized Assessments completed: GAD-7 and PHQ 9 GAD-7= 13 PHQ-9= 06  Assessment: Patient currently experiencing see above.   Patient may benefit from see above.  Plan: Follow up with behavioral health clinician on : 04/07/2022 at 3:30 pm  Behavioral recommendations:  Referral(s): [place] (In Clinic)  I discussed the assessment and treatment plan with the  patient and/or parent/guardian. They were provided an opportunity to ask questions and all were answered. They agreed with the plan and demonstrated an understanding of the instructions.   They were advised to call back or seek an in-person evaluation if the symptoms worsen or if the condition fails to improve as anticipated.  Lesli Albee, LCSWA

## 2022-04-02 NOTE — Progress Notes (Signed)
Established Patient Office Visit  Subjective   Patient ID: Miguel [name], male    DOB: [birth year]  Age: 61 y.o. MRN: [id number]  Chief Complaint  Patient presents with   Follow-up    HPI Miguel [name] is a 61 y/o male who has history of Alcohol abuse, Anxiety, Asthma, GERD, Gout, Hypertension, Renal calculi presents for routine follow up visit . He states that he's compliant with his medications, denies side effects and continues to make healthy lifestyle changes. He states that his been sober since November  2022. States that his mood is good, denies suicidal nor homicidal ideation. Overall, he states that he's doing well and offers no further complaint.   Review of Systems  Constitutional: Negative.   Respiratory: Negative.    Cardiovascular: Negative.   Neurological: Negative.   Psychiatric/Behavioral: Negative.       Objective:     BP 124/77 (BP Location: Right Arm, Patient Position: Sitting, Cuff Size: Large)   Pulse 99   Temp 97.7 F (36.5 C) (Oral)   Resp 16   Ht 6' (1.829 m)   Wt 186 lb 9.6 oz (84.6 kg)   SpO2 98%   BMI 25.31 kg/m  BP Readings from Last 3 Encounters:  04/02/22 124/77  02/17/22 112/78  01/20/22 105/73   Wt Readings from Last 3 Encounters:  04/02/22 186 lb 9.6 oz (84.6 kg)  02/17/22 187 lb 14.4 oz (85.2 kg)  01/20/22 188 lb (85.3 kg)      Physical Exam HENT:     Head: Normocephalic and atraumatic.  Eyes:     Extraocular Movements: Extraocular movements intact.     Conjunctiva/sclera: Conjunctivae normal.     Pupils: Pupils are equal, round, and reactive to light.  Cardiovascular:     Rate and Rhythm: Normal rate and regular rhythm.     Pulses: Normal pulses.     Heart sounds: Normal heart sounds.  Pulmonary:     Effort: Pulmonary effort is normal.     Breath sounds: Normal breath sounds.  Neurological:     General: No focal deficit present.     Mental Status: He is alert and oriented to person, place, and time. Mental status is at  baseline.  Psychiatric:        Mood and Affect: Mood normal.        Behavior: Behavior normal.        Thought Content: Thought content normal.        Judgment: Judgment normal.     No results found for any visits on 04/02/22.  Last CBC Lab Results  Component Value Date   WBC 8.2 11/21/2021   HGB 13.8 11/21/2021   HCT 42.9 11/21/2021   MCV 85.3 11/21/2021   MCH 27.4 11/21/2021   RDW 16.8 (H) 11/21/2021   PLT 230 11/94/1740   Last metabolic panel Lab Results  Component Value Date   GLUCOSE 99 11/21/2021   NA 136 11/21/2021   K 4.1 11/21/2021   CL 103 11/21/2021   CO2 26 11/21/2021   BUN 12 11/21/2021   CREATININE 1.17 11/21/2021   GFRNONAA >60 11/21/2021   CALCIUM 9.4 11/21/2021   PHOS 2.7 11/29/2020   PROT 7.6 11/21/2021   ALBUMIN 4.5 11/21/2021   LABGLOB 2.6 05/22/2020   AGRATIO 1.7 05/22/2020   BILITOT 1.6 (H) 11/21/2021   ALKPHOS 67 11/21/2021   AST 20 11/21/2021   ALT 24 11/21/2021   ANIONGAP 7 11/21/2021   Last lipids Lab Results  Component Value Date   CHOL 155 05/22/2020   HDL 65 05/22/2020   LDLCALC 73 05/22/2020   TRIG 92 05/22/2020   CHOLHDL 2.4 05/22/2020   Last hemoglobin A1c Lab Results  Component Value Date   HGBA1C 5.2 11/29/2020   Last thyroid functions Lab Results  Component Value Date   TSH 0.052 (L) 11/29/2020      The 10-year ASCVD risk score (Arnett DK, et al., 2019) is: 7.6%    Assessment & Plan:   1. Other secondary hypertension - His blood pressure is under control, will continue on current medication, DASH diet and exercise as tolerated.  2. Generalized anxiety disorder - His mood is stable, he will continue on current medication, advised to call Crisis help line with worsening symptoms. He will continue to follow up at [facility] team.   Return in about 2 months (around 06/02/2022), or if symptoms worsen or fail to improve.    Deadrian Toya Jerold Coombe, NP

## 2022-04-02 NOTE — Patient Instructions (Signed)

## 2022-04-03 ENCOUNTER — Other Ambulatory Visit: Payer: Self-pay

## 2022-04-06 ENCOUNTER — Other Ambulatory Visit: Payer: Self-pay

## 2022-04-06 MED FILL — Hydroxyzine HCl Tab 25 MG: ORAL | 11 days supply | Qty: 33 | Fill #0 | Status: CN

## 2022-04-06 MED FILL — Hydroxyzine HCl Tab 25 MG: ORAL | 11 days supply | Qty: 33 | Fill #0 | Status: AC

## 2022-04-07 ENCOUNTER — Ambulatory Visit: Payer: Medicaid Other | Admitting: Licensed Clinical Social Worker

## 2022-04-07 ENCOUNTER — Ambulatory Visit: Payer: Self-pay | Admitting: Licensed Clinical Social Worker

## 2022-04-07 DIAGNOSIS — F1011 Alcohol abuse, in remission: Secondary | ICD-10-CM

## 2022-04-07 DIAGNOSIS — F411 Generalized anxiety disorder: Secondary | ICD-10-CM

## 2022-04-07 IMAGING — CT CT CHEST-ABD-PELV W/ CM
3 of 4 series · 10 of 36 positions shown, 16 images · IV contrast (omnipaque)
Comparison: CT chest 03/08/2019.  CT abdomen and pelvis 06/28/2021

CLINICAL DATA: Fall.  Intoxicated.

EXAM:
CT CHEST, ABDOMEN, AND PELVIS WITH CONTRAST
TECHNIQUE: Multidetector CT imaging of the chest, abdomen and pelvis was
performed following the standard protocol during bolus
administration of intravenous contrast.
CONTRAST:  100mL OMNIPAQUE IOHEXOL 350 MG/ML SOLN

[Series 4: lung · axial · 0.93mm/px · z∈[-492,-426]mm · 3 of 139 slices shown]
[im 14/139  bone]
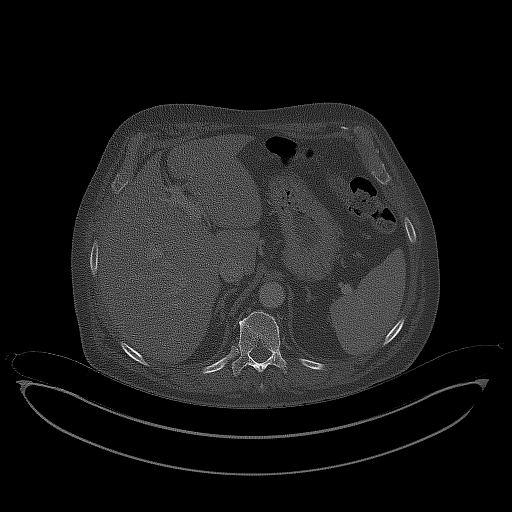
[im 27/139  bone]
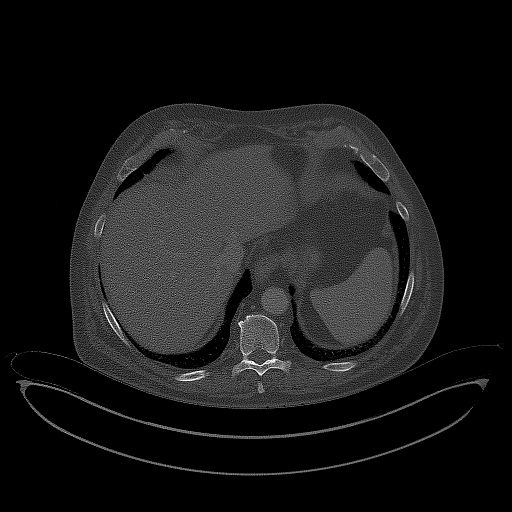
[im 47/139  bone]
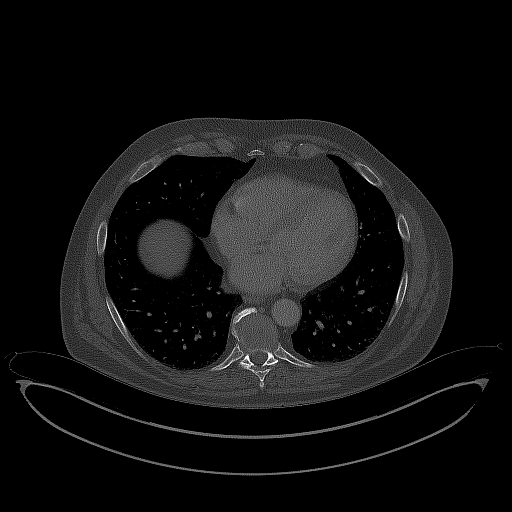

[Series 5: coronals · coronal · 0.80mm/px · 3 of 146 slices shown, 4 images]
[im 30/146  mediastinal]
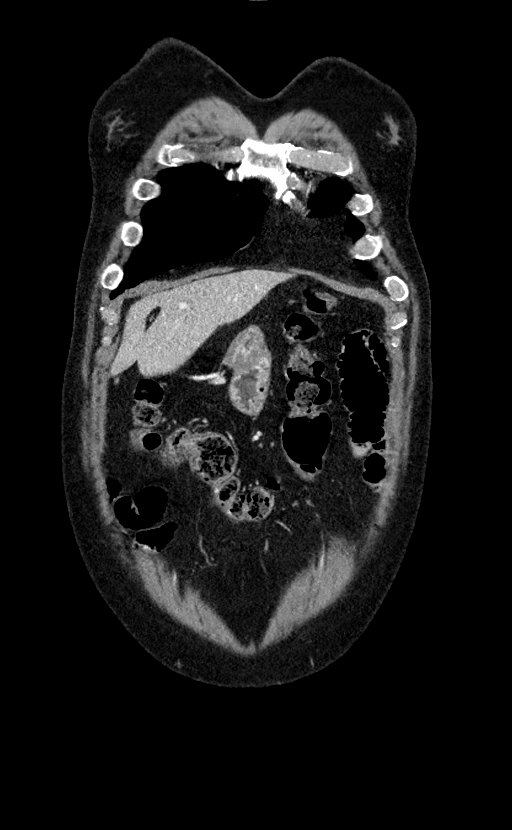
[im 59/146  mediastinal]
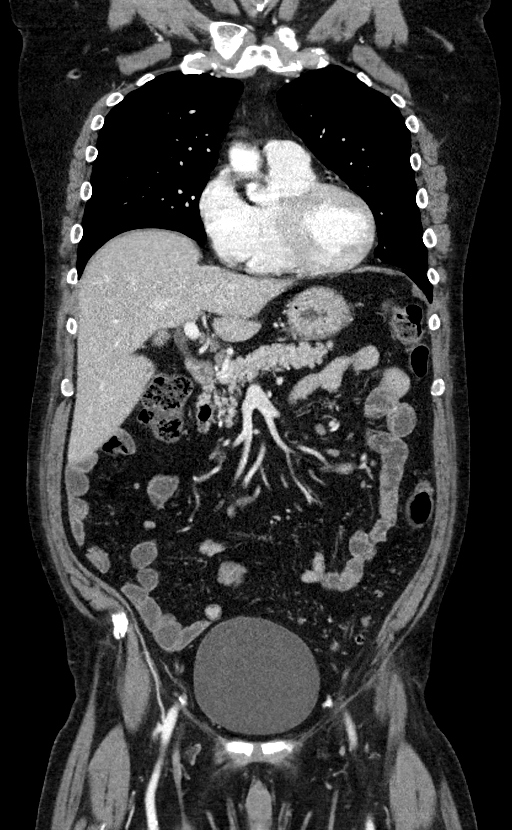
[im 59/146  bone]
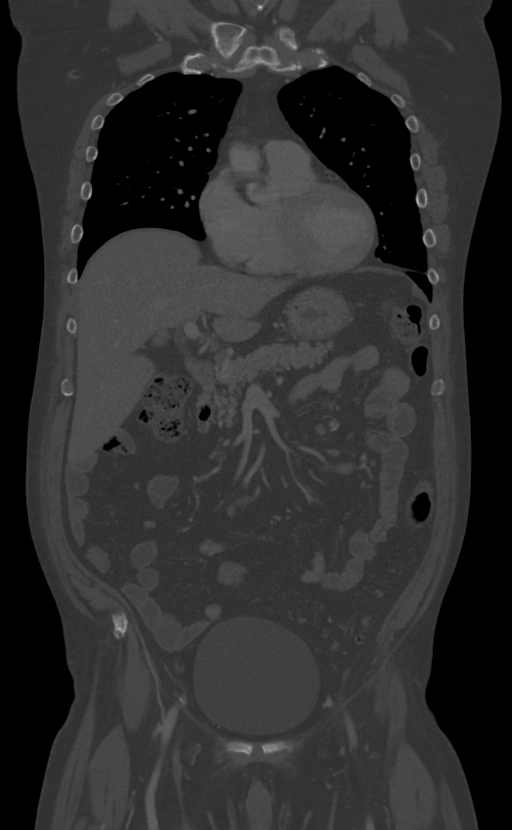
[im 88/146  mediastinal]
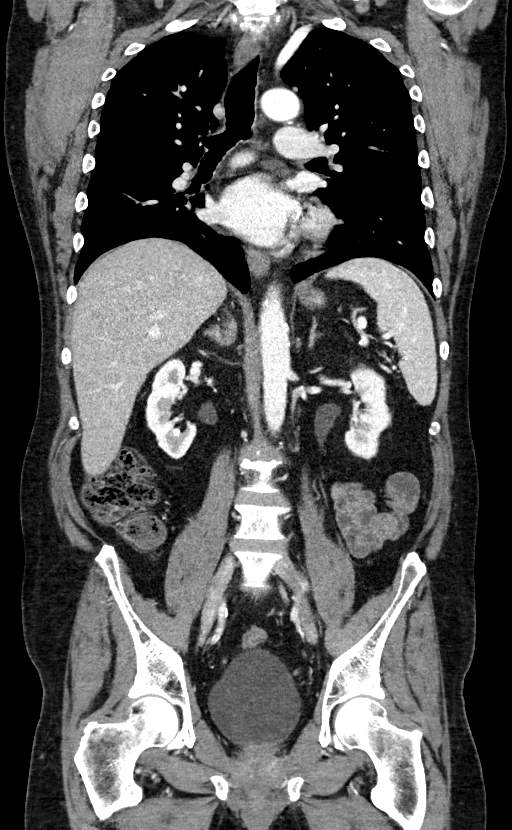

[Series 7: delay · axial · delayed · 0.82mm/px · z∈[-617,-517]mm · 4 of 34 slices shown, 9 images]
[im 7/34  mediastinal]
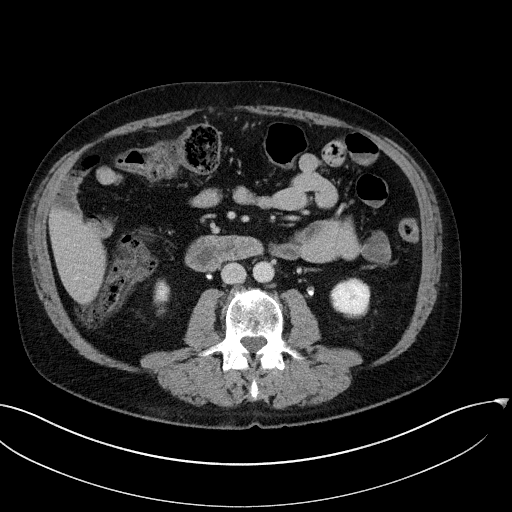
[im 7/34  lung]
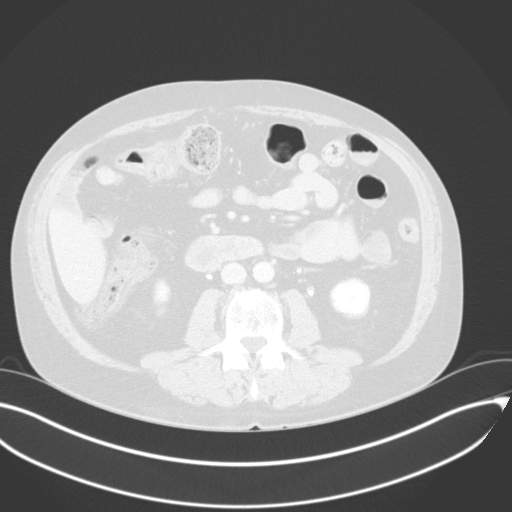
[im 7/34  bone]
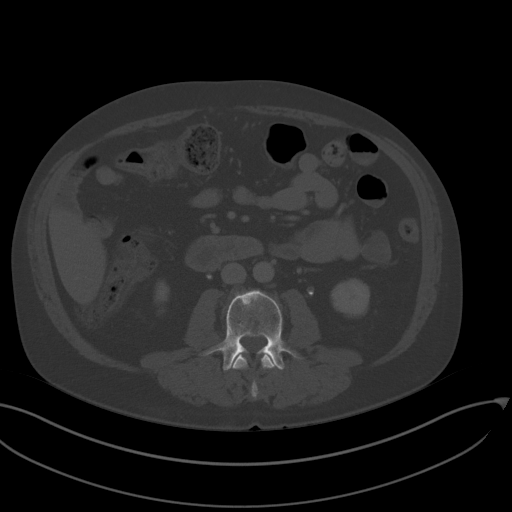
[im 14/34  mediastinal]
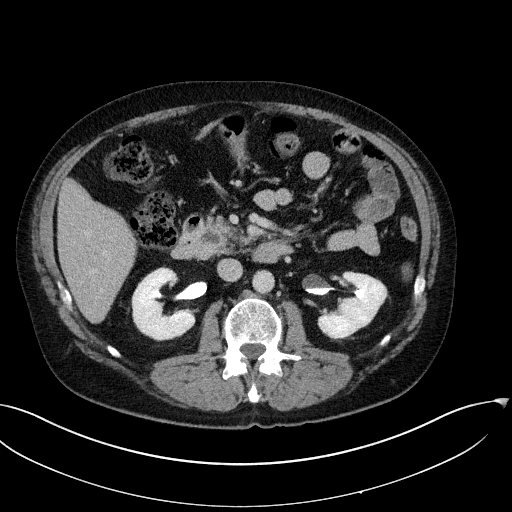
[im 14/34  lung]
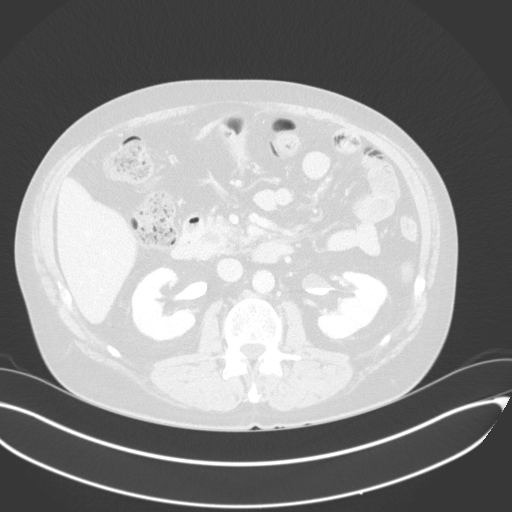
[im 20/34  mediastinal]
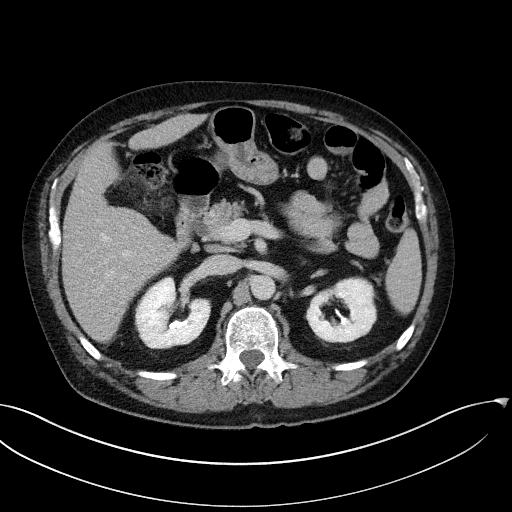
[im 20/34  lung]
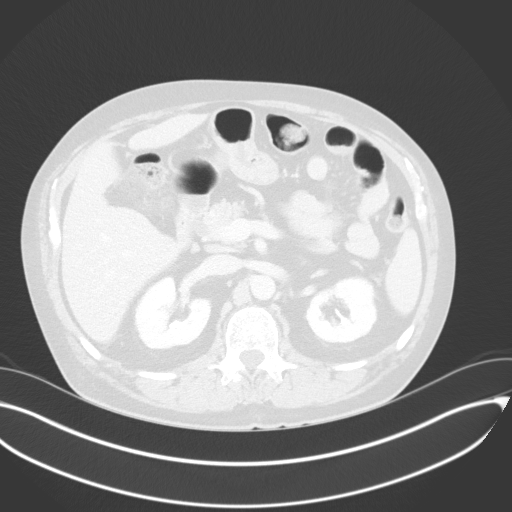
[im 27/34  mediastinal]
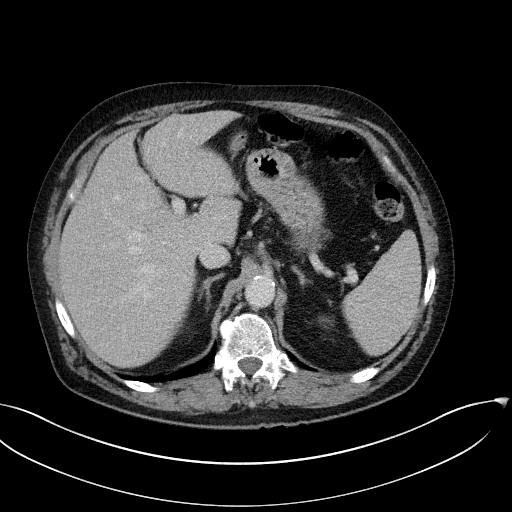
[im 27/34  lung]
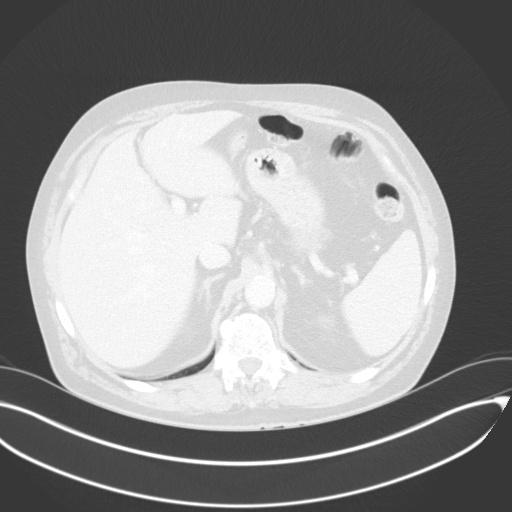

[10 of 36 positions shown; findings below may reference images not displayed]

FINDINGS: CT CHEST FINDINGS

Cardiovascular: No significant vascular findings. Normal heart size.
No pericardial effusion.

Mediastinum/Nodes: No enlarged mediastinal, hilar, or axillary lymph
nodes. Thyroid gland, trachea, and esophagus demonstrate no
significant findings.

Lungs/Pleura: Slight scarring in the lung bases. Lungs are otherwise
clear and expanded. No pleural effusions. No pneumothorax. Airways
are patent.

Musculoskeletal: No acute fractures identified.

CT ABDOMEN PELVIS FINDINGS

Hepatobiliary: No focal liver lesions identified. Gallbladder
appears surgically absent. No bile duct dilatation.

Pancreas: Unremarkable. No pancreatic ductal dilatation or
surrounding inflammatory changes.

Spleen: No splenic injury or perisplenic hematoma.

Adrenals/Urinary Tract: No adrenal gland nodules. Multiple
intrarenal stones are demonstrated. No hydronephrosis or
hydroureter. Renal nephrograms are symmetrical. No renal hematoma or
laceration. Bladder is unremarkable.

Stomach/Bowel: Stomach, small bowel, and colon are not abnormally
distended. No wall thickening or inflammatory changes. No mesenteric
hematoma or infiltration. Appendix is normal.

Vascular/Lymphatic: No significant vascular findings are present. No
enlarged abdominal or pelvic lymph nodes.

Reproductive: Prostate is unremarkable.

Other: No free air or free fluid in the abdomen. Abdominal wall
musculature appears intact.

Musculoskeletal: No acute fractures are identified.
IMPRESSION: No acute posttraumatic changes are demonstrated in the chest,
abdomen, or pelvis.

## 2022-04-07 IMAGING — CT CT HEAD W/O CM
3 series · 16 of 47 positions shown, 19 images · non-contrast
Comparison: 01/24/2021

CLINICAL DATA: Fall.  Alcohol intoxication.  Abrasions.

EXAM:
CT HEAD WITHOUT CONTRAST
TECHNIQUE: Contiguous axial images were obtained from the base of the skull
through the vertex without intravenous contrast.

[Series 2: head wo · axial · 0.45mm/px · z∈[-120,+35]mm · 10 of 37 slices shown, 13 images]
[im 3/37  brain]
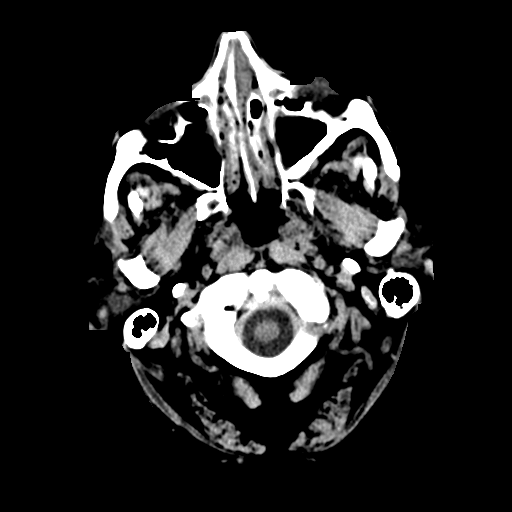
[im 3/37  bone]
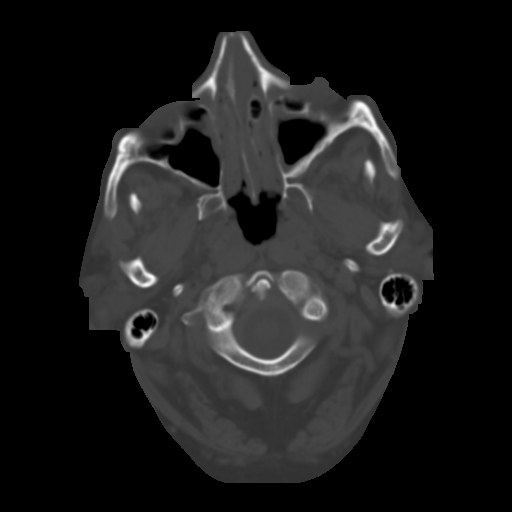
[im 7/37  brain]
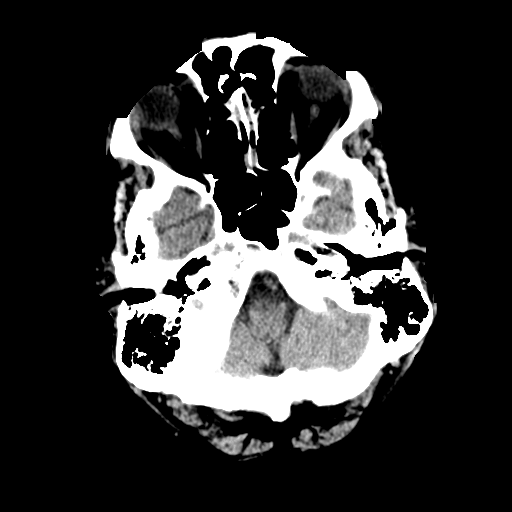
[im 10/37  brain]
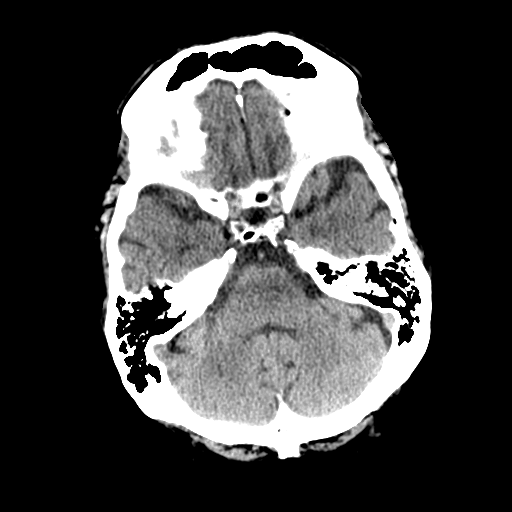
[im 13/37  brain]
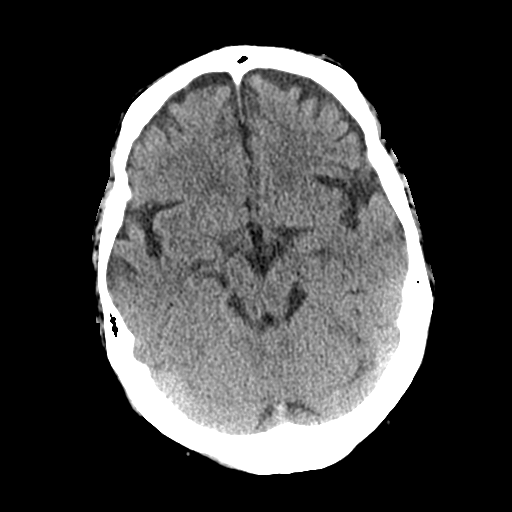
[im 17/37  brain]
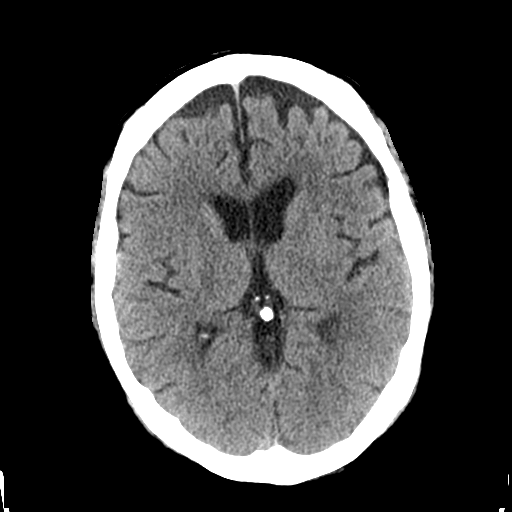
[im 17/37  bone]
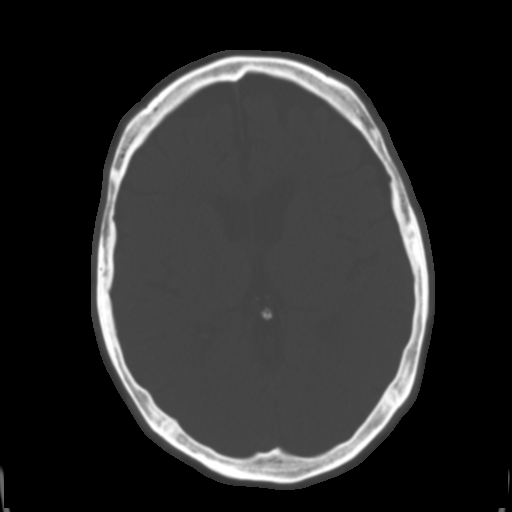
[im 20/37  brain]
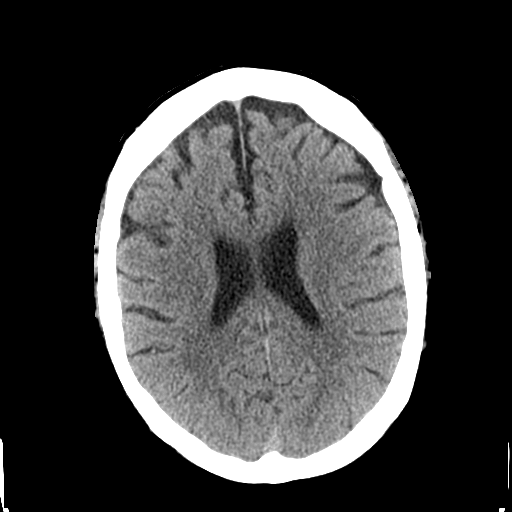
[im 24/37  brain]
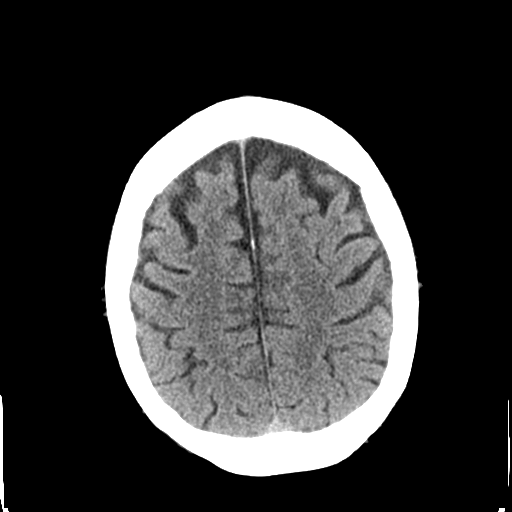
[im 28/37  brain]
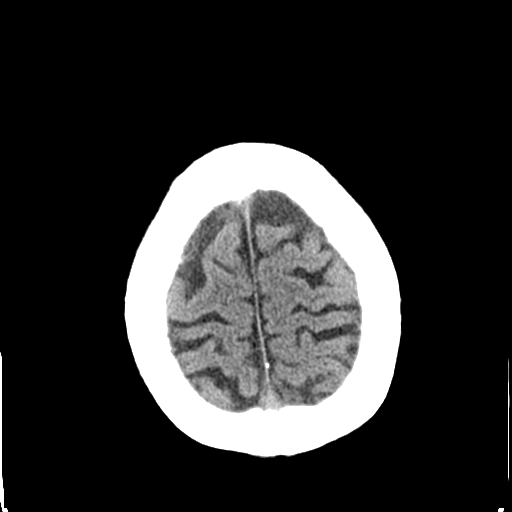
[im 30/37  brain]
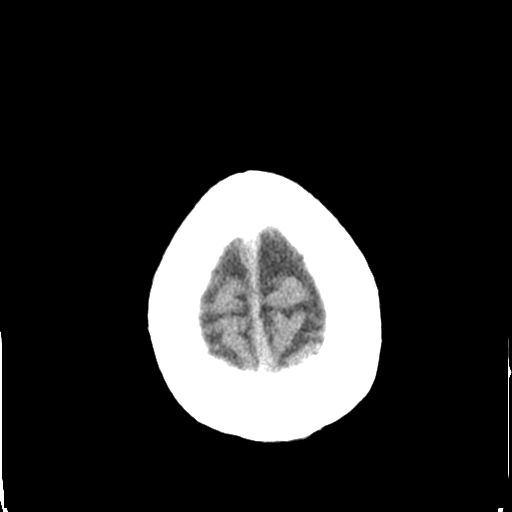
[im 30/37  bone]
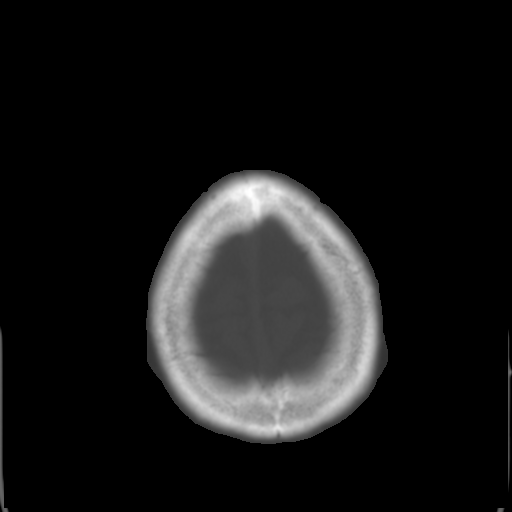
[im 34/37  brain]
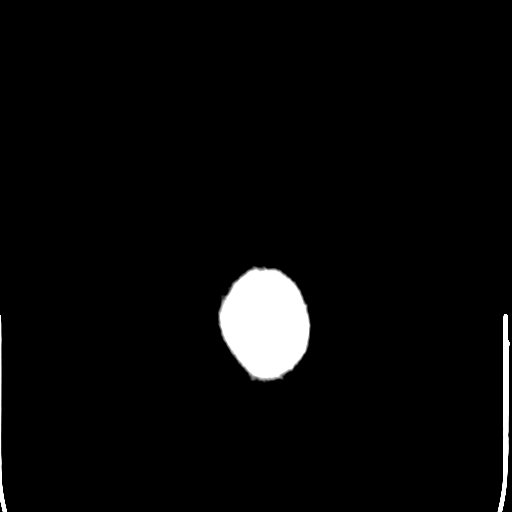

[Series 4: coronal soft tissue · coronal · 0.38mm/px · 3 of 72 slices shown]
[im 24/72  brain]
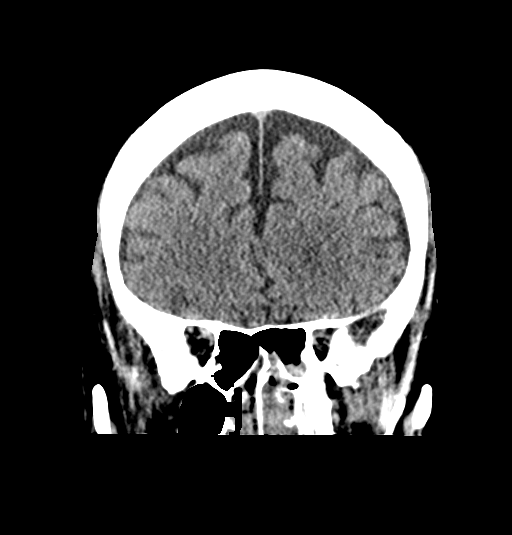
[im 32/72  brain]
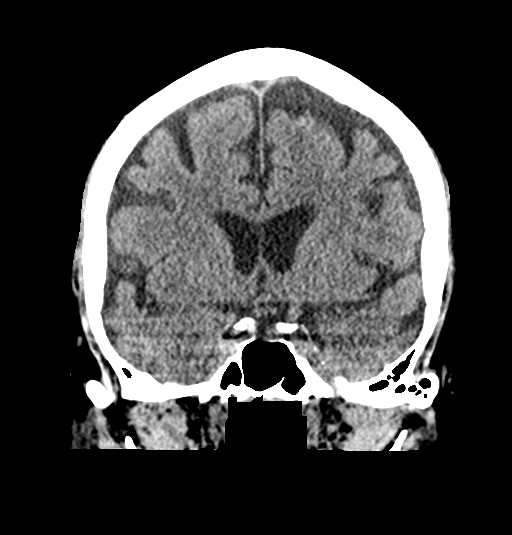
[im 40/72  brain]
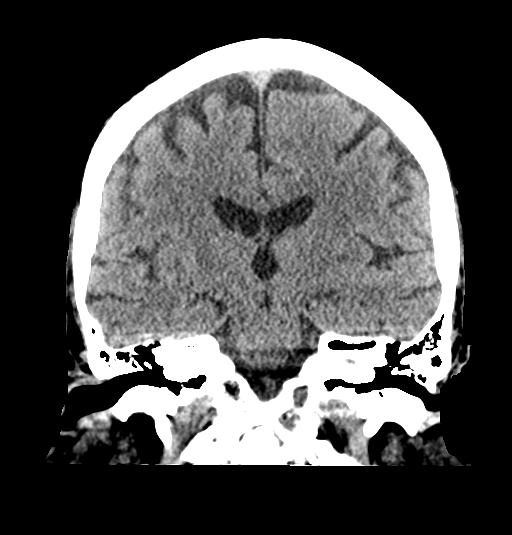

[Series 5: sagittal soft tissue · sagittal · 0.40mm/px · 3 of 62 slices shown]
[im 21/62  brain]
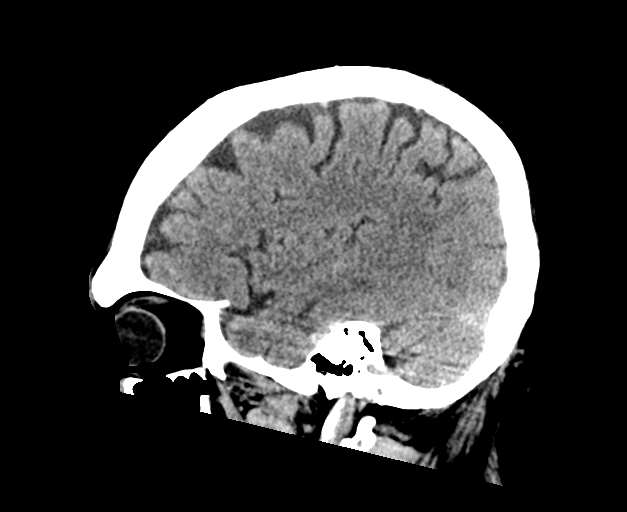
[im 31/62  brain]
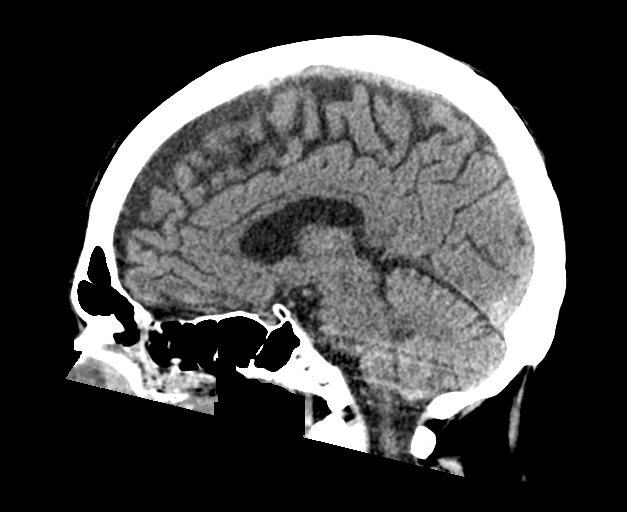
[im 41/62  brain]
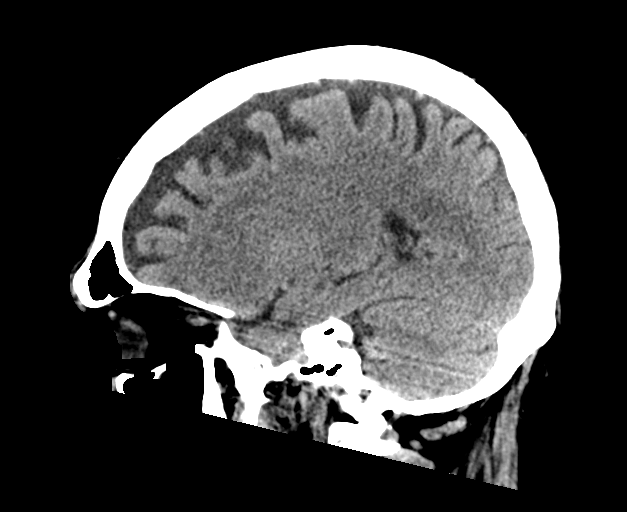

[16 of 47 positions shown; findings below may reference images not displayed]

FINDINGS: Brain: No evidence of acute infarction, hemorrhage, hydrocephalus,
extra-axial collection or mass lesion/mass effect. Diffuse cerebral
atrophy. Patchy low-attenuation changes in the deep white matter
consistent with small vessel ischemia.

Vascular: Mild intracranial arterial vascular calcifications.

Skull: Calvarium appears intact.

Sinuses/Orbits: Mucosal thickening in the paranasal sinuses. No
acute air-fluid levels. Mastoid air cells are clear.

Other: No significant changes since previous study.
IMPRESSION: No acute intracranial abnormalities. Chronic atrophy and small
vessel ischemic changes. Probable inflammatory changes in the
paranasal sinuses.

## 2022-04-07 NOTE — BH Specialist Note (Signed)
Integrated Behavioral Health via Telemedicine Visit  04/07/2022 Miguel [name] [id number]  Referring Provider: Carlyon Shadow, NP Patient/Family location: The patient's address [facility] Provider location: [facility] All persons participating in visit: Miguel [name]. [name] and [name], LCSW-A Types of Service: Telephone visit  I connected with Miguel [name] via  Telephone or Video Enabled Telemedicine Application  (Video is Caregility application) and verified that I am speaking with the correct person using two identifiers. Discussed confidentiality: Yes   I discussed the limitations of telemedicine and the availability of in person appointments.  Discussed there is a possibility of technology failure and discussed alternative modes of communication if that failure occurs.  Patient and/or legal guardian expressed understanding and consented to Telemedicine visit: Yes   Presenting Concerns: Patient and/or family reports the following symptoms/concerns: The patient reports that he has been doing well since his last follow-up appointment. He shared that he enjoyed watching a car show on TV this week. He noted that he has been doing well overall and continues to work on Runner, broadcasting/film/video and Sales executive projects. He noted that he is looking forward to his wife coming home next month. The patient denied any recent panic attacks and stated that he is decreasing his dosage of hydroxyzine (ATARAX) 25 MG and only takes it if he needs it. Miguel [name] discussed other situation and family stressors impacting his life. He noted that he stays busy and focuses on  being mindful and thinking positive thoughts to help him cope. Miguel [name] noted that he continues to maintain his sobriety. He noted that he is proud of the progress he is making. Miguel [name] denied any suicidal or homicidal thoughts.  Duration of problem: Years ; Severity of problem: moderate  Patient and/or Family's Strengths/Protective Factors: Concrete  supports in place (healthy food, safe environments, etc.)  Goals Addressed: Patient will:  Reduce symptoms of: agitation, anxiety, depression, and stress   Increase knowledge and/or ability of: coping skills, healthy habits, self-management skills, and stress reduction   Demonstrate ability to: Increase healthy adjustment to current life circumstances and Increase adequate support systems for patient/family  Progress towards Goals: Ongoing  Interventions: Interventions utilized:  CBT Cognitive Behavioral Therapywas utilized by the clinician during today's follow up session. Clinician met with patient to identify needs related to stressors and functioning, and assess and monitor for signs and symptoms of anxiety and depression, and assess safety. The clinician processed with the patient how they have been doing since the last follow-up session. Clinician provided a safe judgement free space for the patient to vent his frustrations regarding his current life circumstances. Clinician congratulated the patient on his progress and encouraged him to continue to practice implementing his coping skills to help him deal with his current life circumstances. The session ended with scheduling.  Standardized Assessments completed:  Due to time constraints will complete at follow-up.    Assessment: Patient currently experiencing see above.   Patient may benefit from see above.  Plan: Follow up with behavioral health clinician on : 06/07/202 at 11:00 AM Behavioral recommendations:  Referral(s): [place] (In Clinic)  I discussed the assessment and treatment plan with the patient and/or parent/guardian. They were provided an opportunity to ask questions and all were answered. They agreed with the plan and demonstrated an understanding of the instructions.   They were advised to call back or seek an in-person evaluation if the symptoms worsen or if the condition fails to  improve as anticipated.  Lesli Albee, LCSWA

## 2022-04-22 ENCOUNTER — Other Ambulatory Visit: Payer: Self-pay | Admitting: Gerontology

## 2022-04-22 ENCOUNTER — Other Ambulatory Visit: Payer: Self-pay

## 2022-04-22 ENCOUNTER — Ambulatory Visit: Payer: Medicaid Other | Admitting: Licensed Clinical Social Worker

## 2022-04-22 ENCOUNTER — Encounter: Payer: Self-pay | Admitting: Licensed Clinical Social Worker

## 2022-04-22 DIAGNOSIS — R0602 Shortness of breath: Secondary | ICD-10-CM

## 2022-04-22 DIAGNOSIS — F1011 Alcohol abuse, in remission: Secondary | ICD-10-CM

## 2022-04-22 DIAGNOSIS — F411 Generalized anxiety disorder: Secondary | ICD-10-CM

## 2022-04-22 DIAGNOSIS — J45909 Unspecified asthma, uncomplicated: Secondary | ICD-10-CM

## 2022-04-22 IMAGING — CT CT HEAD W/O CM
3 series · 15 of 47 positions shown, 18 images · non-contrast
Comparison: Multiple priors most recently 07/07/2021

CLINICAL DATA: Head trauma, mod-severe positive LOC

Fall striking head on door knob.
EXAM:
CT HEAD WITHOUT CONTRAST
TECHNIQUE: Contiguous axial images were obtained from the base of the skull
through the vertex without intravenous contrast.

[Series 3: head wo · axial · 0.49mm/px · z∈[+316,+446]mm · 9 of 32 slices shown, 12 images]
[im 3/32  brain]
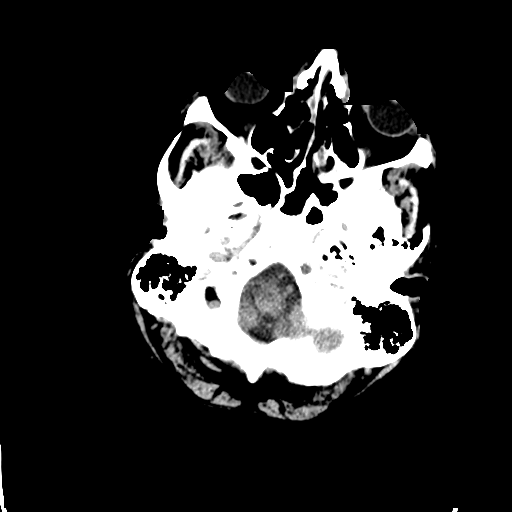
[im 3/32  bone]
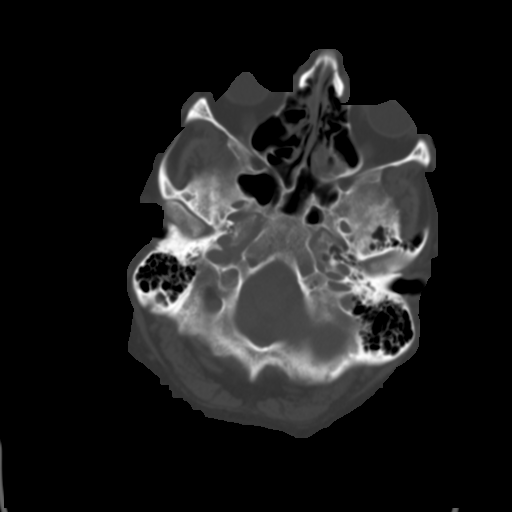
[im 6/32  brain]
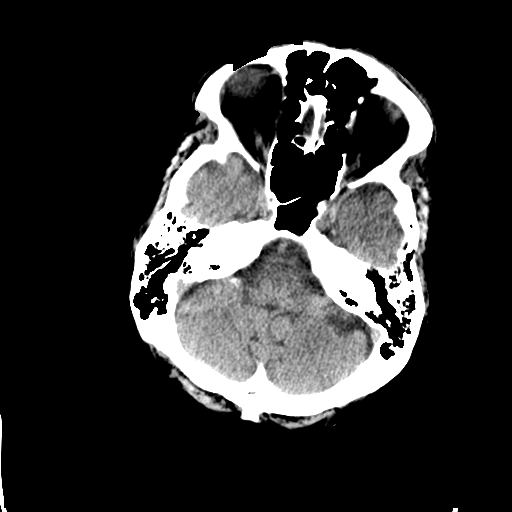
[im 9/32  brain]
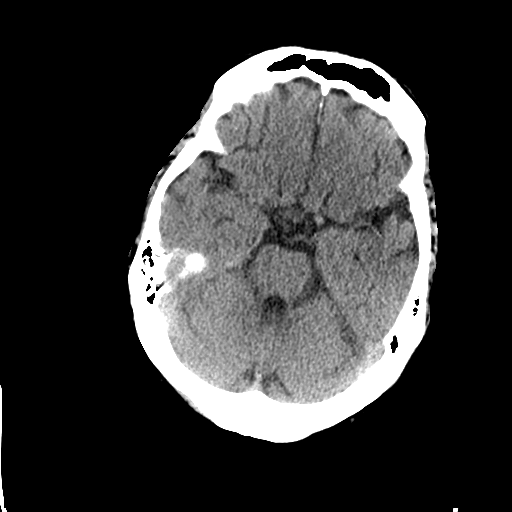
[im 12/32  brain]
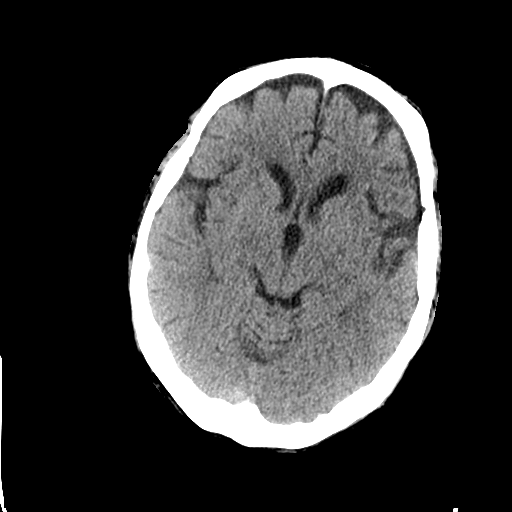
[im 17/32  brain]
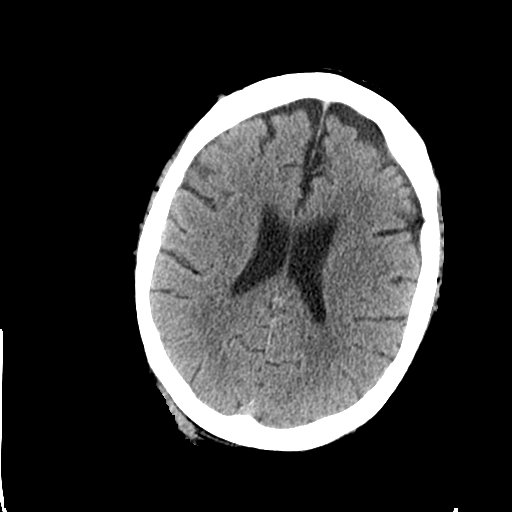
[im 17/32  bone]
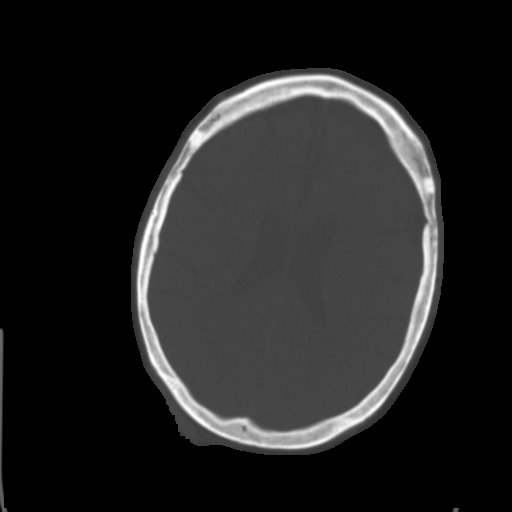
[im 20/32  brain]
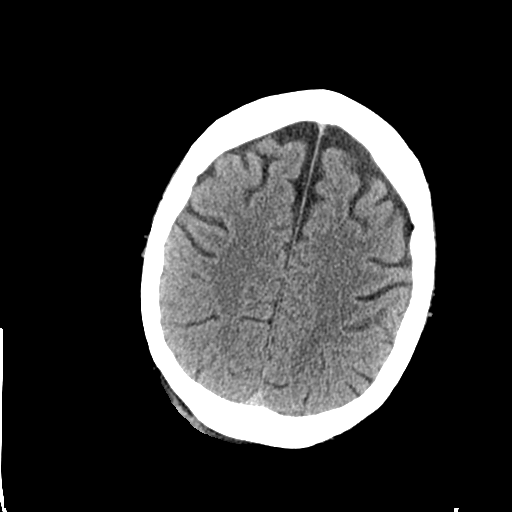
[im 23/32  brain]
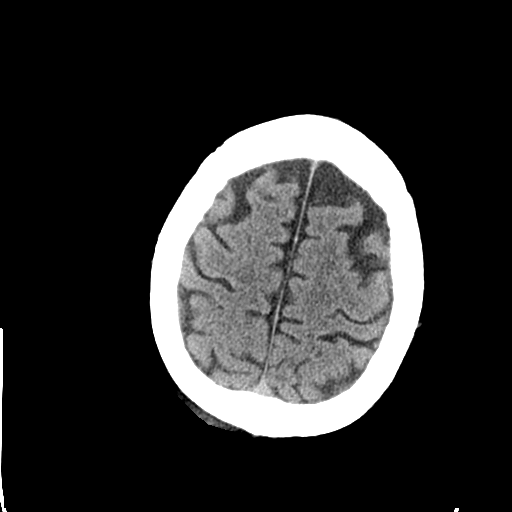
[im 26/32  brain]
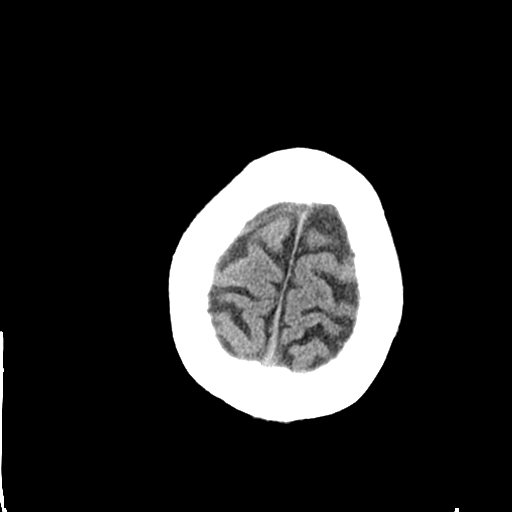
[im 29/32  brain]
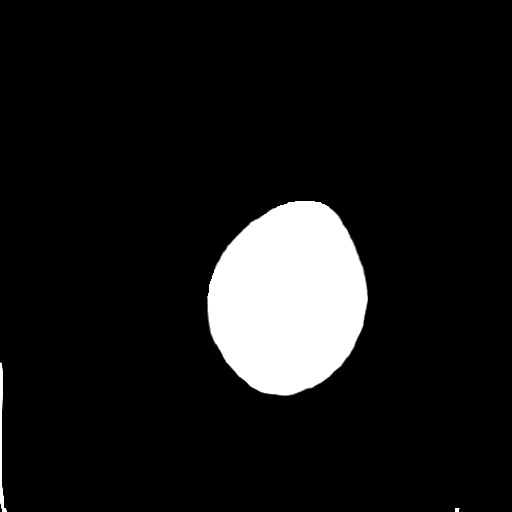
[im 29/32  bone]
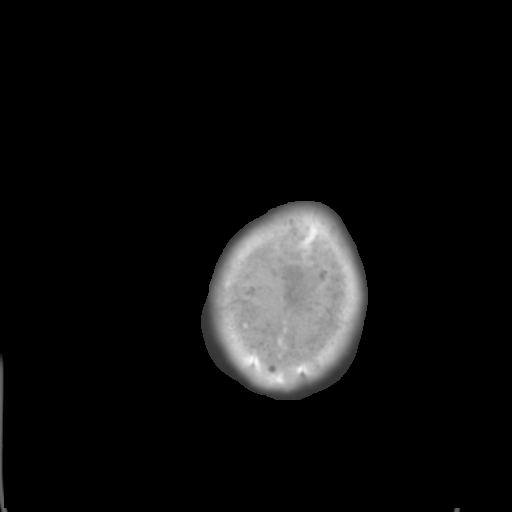

[Series 4: coronal soft tissue · coronal · 0.32mm/px · 3 of 72 slices shown]
[im 24/72  brain]
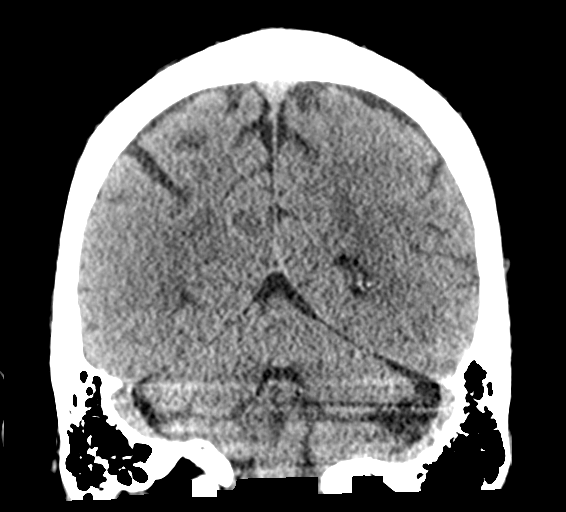
[im 32/72  brain]
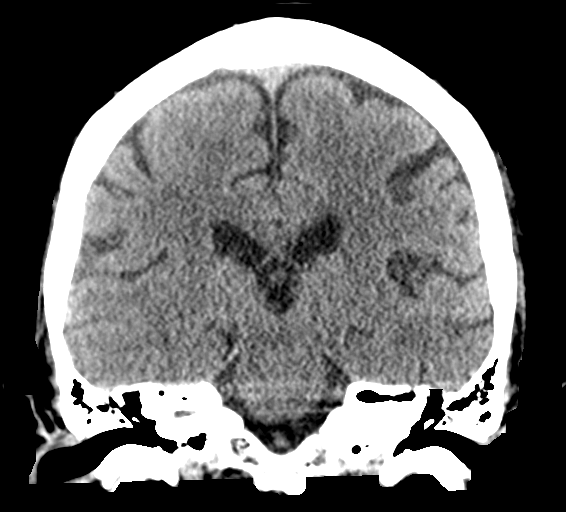
[im 40/72  brain]
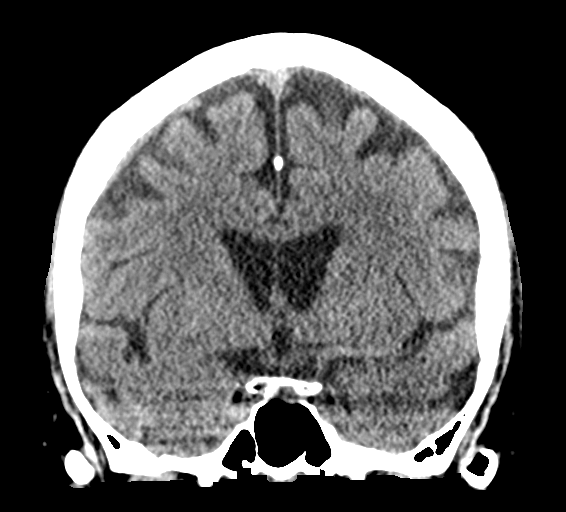

[Series 5: sagittal soft tissue · sagittal · 0.32mm/px · 3 of 60 slices shown]
[im 20/60  brain]
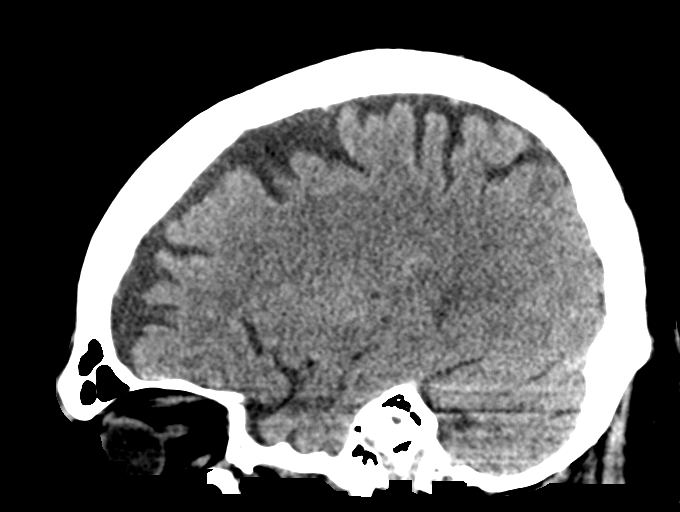
[im 30/60  brain]
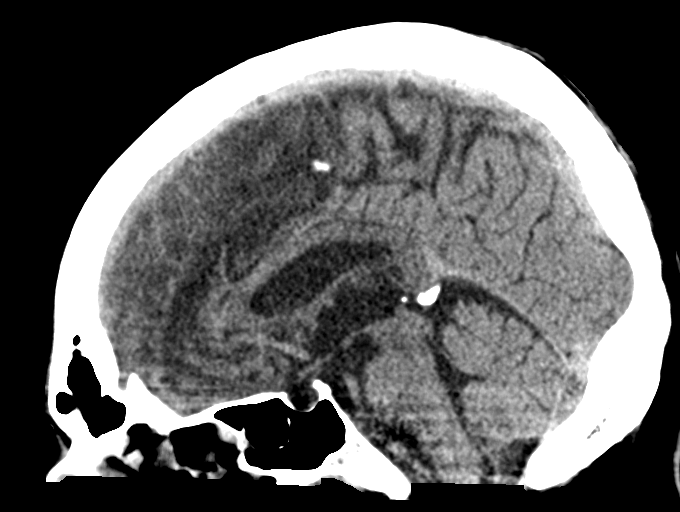
[im 40/60  brain]
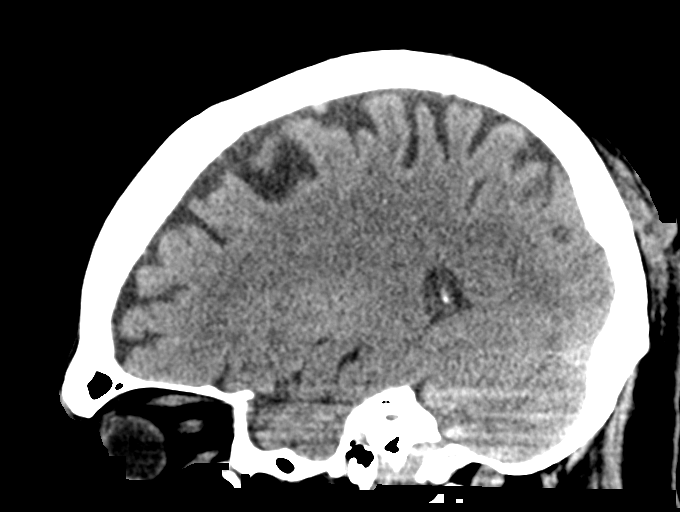

[15 of 47 positions shown; findings below may reference images not displayed]

FINDINGS: Brain: Stable degree of atrophy and chronic small vessel ischemia.
No intracranial hemorrhage, mass effect, or midline shift. No
hydrocephalus. The basilar cisterns are patent. No evidence of
territorial infarct or acute ischemia. No extra-axial or
intracranial fluid collection.

Vascular: No hyperdense vessel or unexpected calcification.

Skull: No fracture or focal lesion.

Sinuses/Orbits: Chronic opacification of ethmoid air cells. No acute
findings. Mastoid air cells are clear

Other: Right parietooccipital scalp hematoma.
IMPRESSION: 1. Right parietooccipital scalp hematoma.
2. No acute intracranial abnormality. No skull fracture.
3. Stable atrophy and chronic small vessel ischemia.

## 2022-04-22 IMAGING — CT CT CERVICAL SPINE W/O CM
3 of 4 series · 11 of 35 positions shown, 13 images · non-contrast
Comparison: Multiple prior exams most recently 07/07/2021

CLINICAL DATA: Head trauma, mod-severe positive LOC

Fall striking head on door knob.
EXAM:
CT CERVICAL SPINE WITHOUT CONTRAST
TECHNIQUE: Multidetector CT imaging of the cervical spine was performed without
intravenous contrast. Multiplanar CT image reconstructions were also
generated.

[Series 4: sagittal bone · sagittal · 0.28mm/px · 5 of 93 slices shown, 6 images]
[im 31/93  bone]
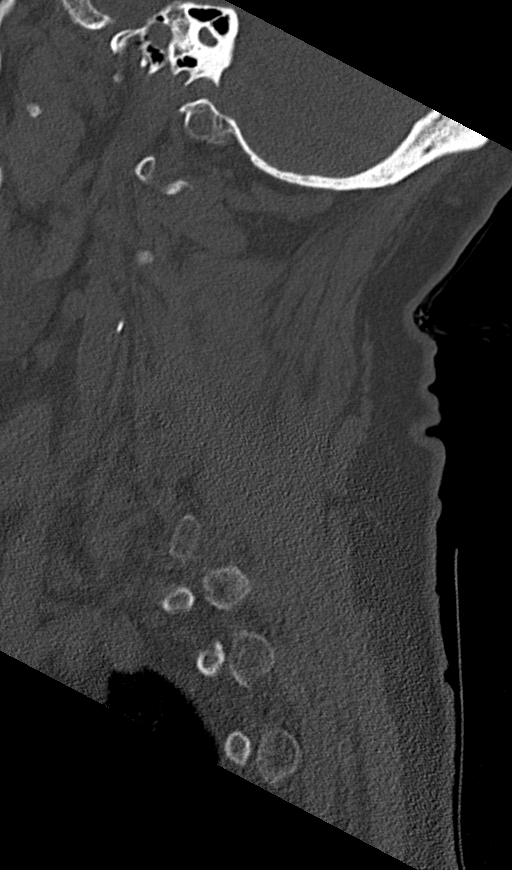
[im 39/93  bone]
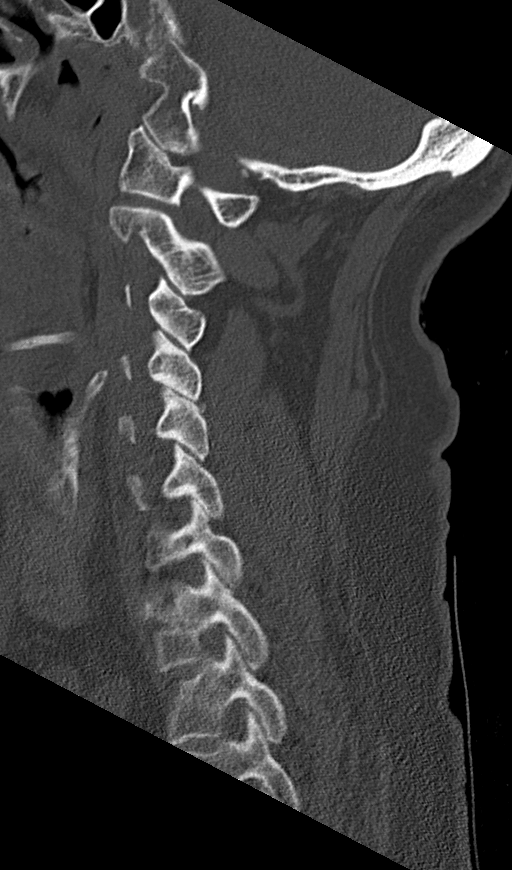
[im 47/93  soft-tissue]
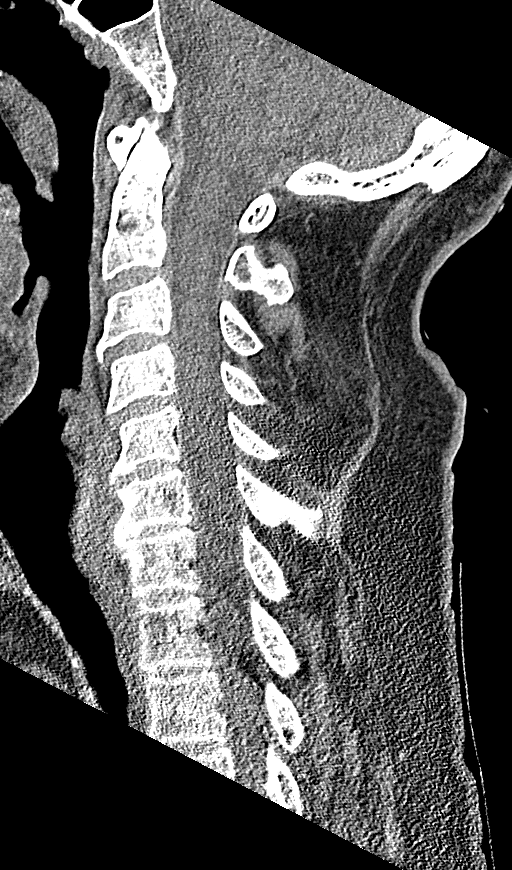
[im 47/93  bone]
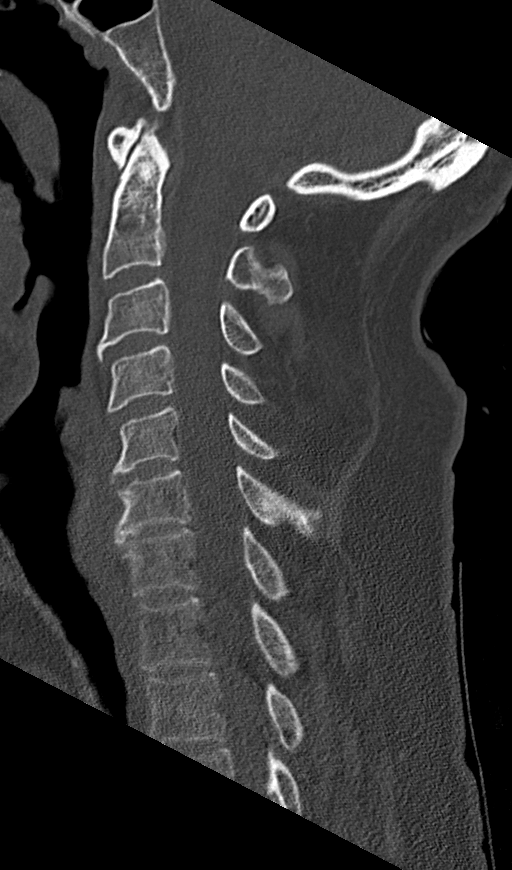
[im 54/93  bone]
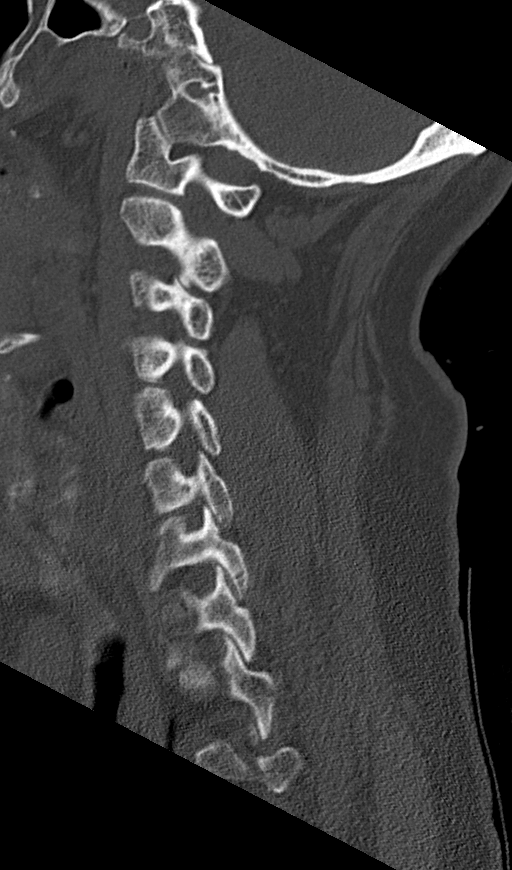
[im 62/93  bone]
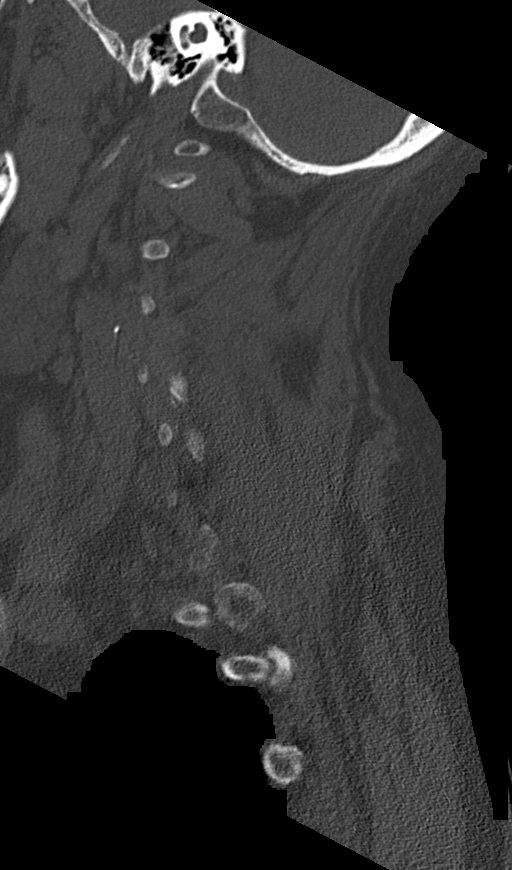

[Series 5: coronal bone · coronal · 0.36mm/px · 3 of 72 slices shown]
[im 20/72  bone]
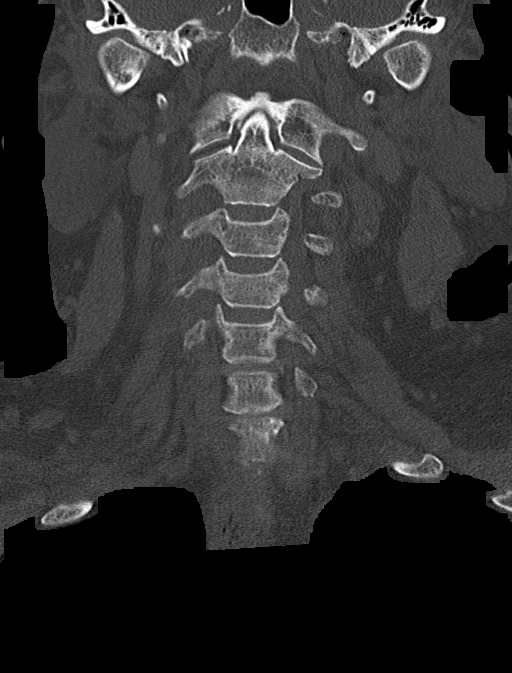
[im 31/72  bone]
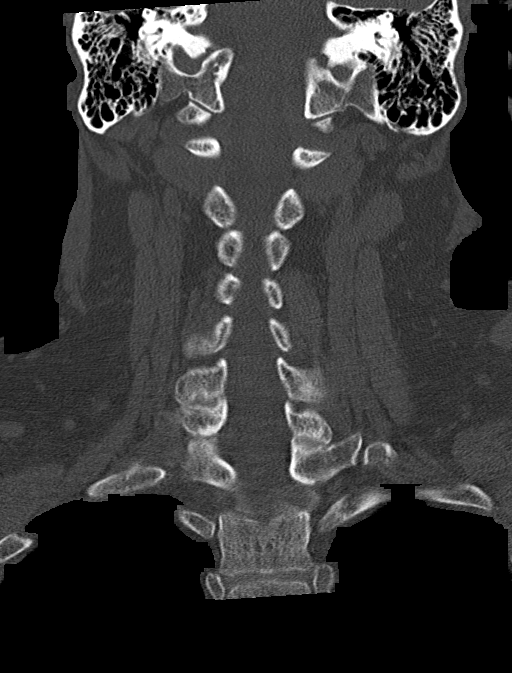
[im 41/72  bone]
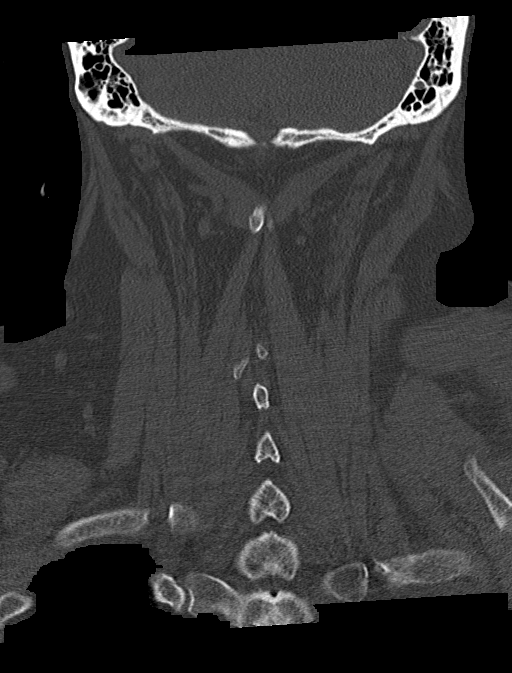

[Series 6: orthogonal bone · axial · 0.28mm/px · z∈[+147,+269]mm · 3 of 122 slices shown, 4 images]
[im 35/122  soft-tissue]
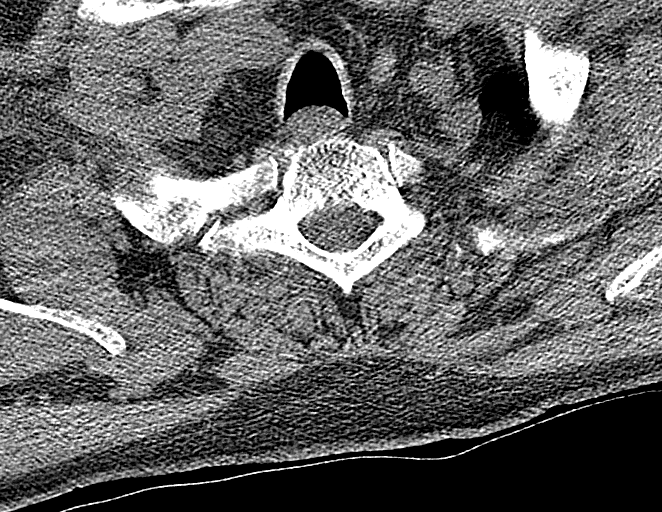
[im 35/122  bone]
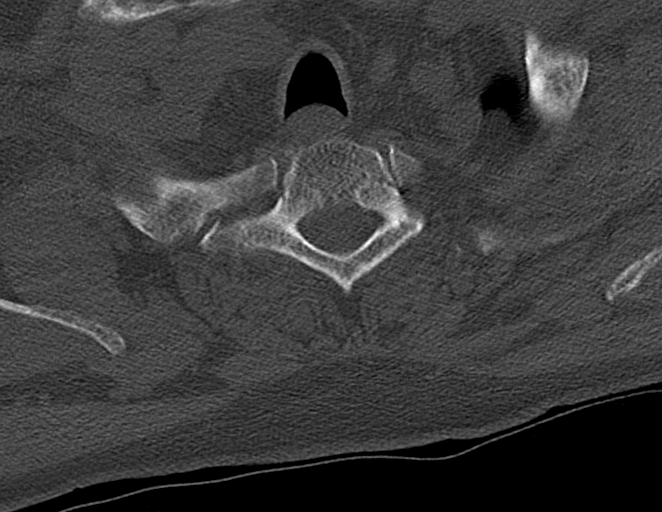
[im 70/122  bone]
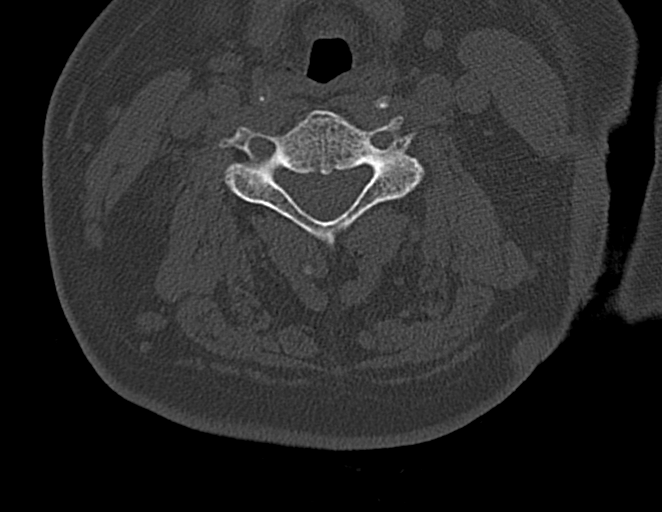
[im 104/122  bone]
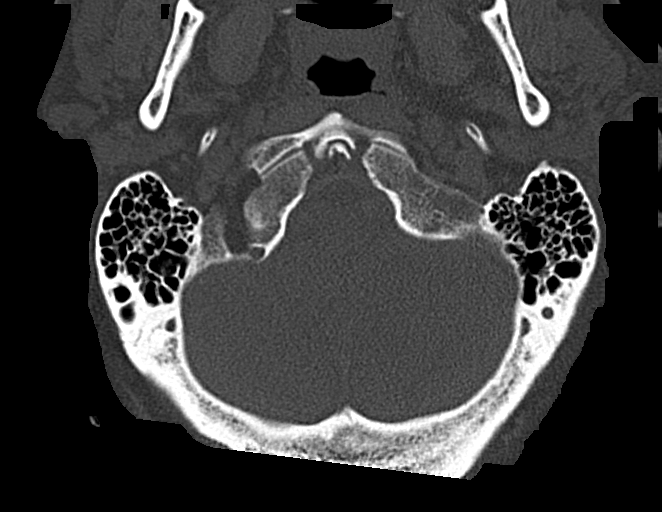

[11 of 35 positions shown; findings below may reference images not displayed]

FINDINGS: Alignment: Straightening of normal lordosis. No traumatic
subluxation.

Skull base and vertebrae: No acute fracture. Vertebral body heights
are maintained. The dens and skull base are intact.

Soft tissues and spinal canal: No prevertebral fluid or swelling. No
visible canal hematoma.

Disc levels: Multilevel degenerative disc disease is unchanged from
prior exam. Minor scattered facet hypertrophy.

Upper chest: Nonacute.

Other: Carotid calcifications.
IMPRESSION: 1. No acute fracture or subluxation of the cervical spine.
2. Stable degenerative change.

## 2022-04-22 MED ORDER — FLUTICASONE FUROATE-VILANTEROL 200-25 MCG/ACT IN AEPB
1.0000 | INHALATION_SPRAY | Freq: Every day | RESPIRATORY_TRACT | 4 refills | Status: DC
  Filled 2022-04-22: qty 60, 60d supply, fill #0

## 2022-04-22 MED FILL — Hydroxyzine HCl Tab 25 MG: ORAL | 19 days supply | Qty: 57 | Fill #1 | Status: AC

## 2022-04-22 NOTE — BH Specialist Note (Signed)
Integrated Behavioral Health Follow Up In-Person Visit  MRN: [id number] Name: Miguel [name]    Types of Service: Individual psychotherapy  Interpretor:No. Interpretor Name and Language: N/A  Subjective: Miguel [name] is a 61 y.o. male accompanied by  himself Patient was referred by Carlyon Shadow, NP for mental health. Patient reports the following symptoms/concerns: The patient reports that he has been doing well since his last follow-up appointment. He noted that he came to his appointment in person today because he was not sure if he heard the reminder message correctly and wanted to make sure he was on time.  Miguel [name] discussed family stressors impacting his life currently.  He shared details of his upbringing in [place] and his current relationship dynamics with his family that remain there.  Miguel [name] noted that he is looking forward to his wife returning home next month.  He explained that he continues to focused on remaining positive, exercise, eating healthy, and drinking water for self-care daily.  Miguel [name] noted that he has been busy with small home projects and his hobby of working on cars and enjoys staying active.  The patient denied any alcohol use since November 2022.  He noted that his wife told him how proud she was of his accomplishments and that made him feel really good.  Miguel [name] denied any suicidal or homicidal thoughts. Duration of problem: Years; Severity of problem: moderate  Objective: Mood: Euthymic and Affect: Appropriate Risk of harm to self or others: No plan to harm self or others  Life Context: Family and Social: see above School/Work: see above Self-Care: see above Life Changes: see above  Patient and/or Family's Strengths/Protective Factors: Concrete supports in place (healthy food, safe environments, etc.) and Sense of purpose  Goals Addressed: Patient will:  Reduce symptoms of: agitation, anxiety, insomnia, and stress   Increase knowledge  and/or ability of: coping skills, healthy habits, self-management skills, and stress reduction   Demonstrate ability to: Increase healthy adjustment to current life circumstances  Progress towards Goals: Ongoing  Interventions: Interventions utilized:  CBT Cognitive Behavioral Therapy and Psychoeducation and/or Health Educationwas utilized by the clinician during today's follow up session. Clinician met with patient to identify needs related to stressors and functioning, and assess and monitor for signs and symptoms of anxiety and depression, and assess safety. The clinician processed with the patient how they have been doing since the last follow-up session. Clinician measured the patient's depression and anxiety on a numerical scale.  Clinician congratulated the patient on his progress and encouraged him to continue using self-care and his coping skills to deal with his current life circumstances.  The session ended with scheduling. Standardized Assessments completed: GAD-7 and PHQ 9 GAD-7=13 PHQ-9= 4  Assessment: Patient currently experiencing see above.   Patient may benefit from see above.  Plan: Follow up with behavioral health clinician on : 04/30/2022 at 10:00 AM Behavioral recommendations:  Referral(s): [place] (In Clinic) "From scale of 1-10, how likely are you to follow plan?":   Lesli Albee, LCSWA

## 2022-04-23 NOTE — Progress Notes (Signed)
This encounter was created in error - please disregard.

## 2022-04-26 ENCOUNTER — Other Ambulatory Visit: Payer: Self-pay

## 2022-04-28 ENCOUNTER — Other Ambulatory Visit: Payer: Self-pay

## 2022-04-28 IMAGING — CR DG CHEST 2V
2 series · 2 of 2 positions shown · non-contrast
Comparison: June 17, 2021.

CLINICAL DATA: Dyspnea.

EXAM:
CHEST - 2 VIEW

[chest lat]
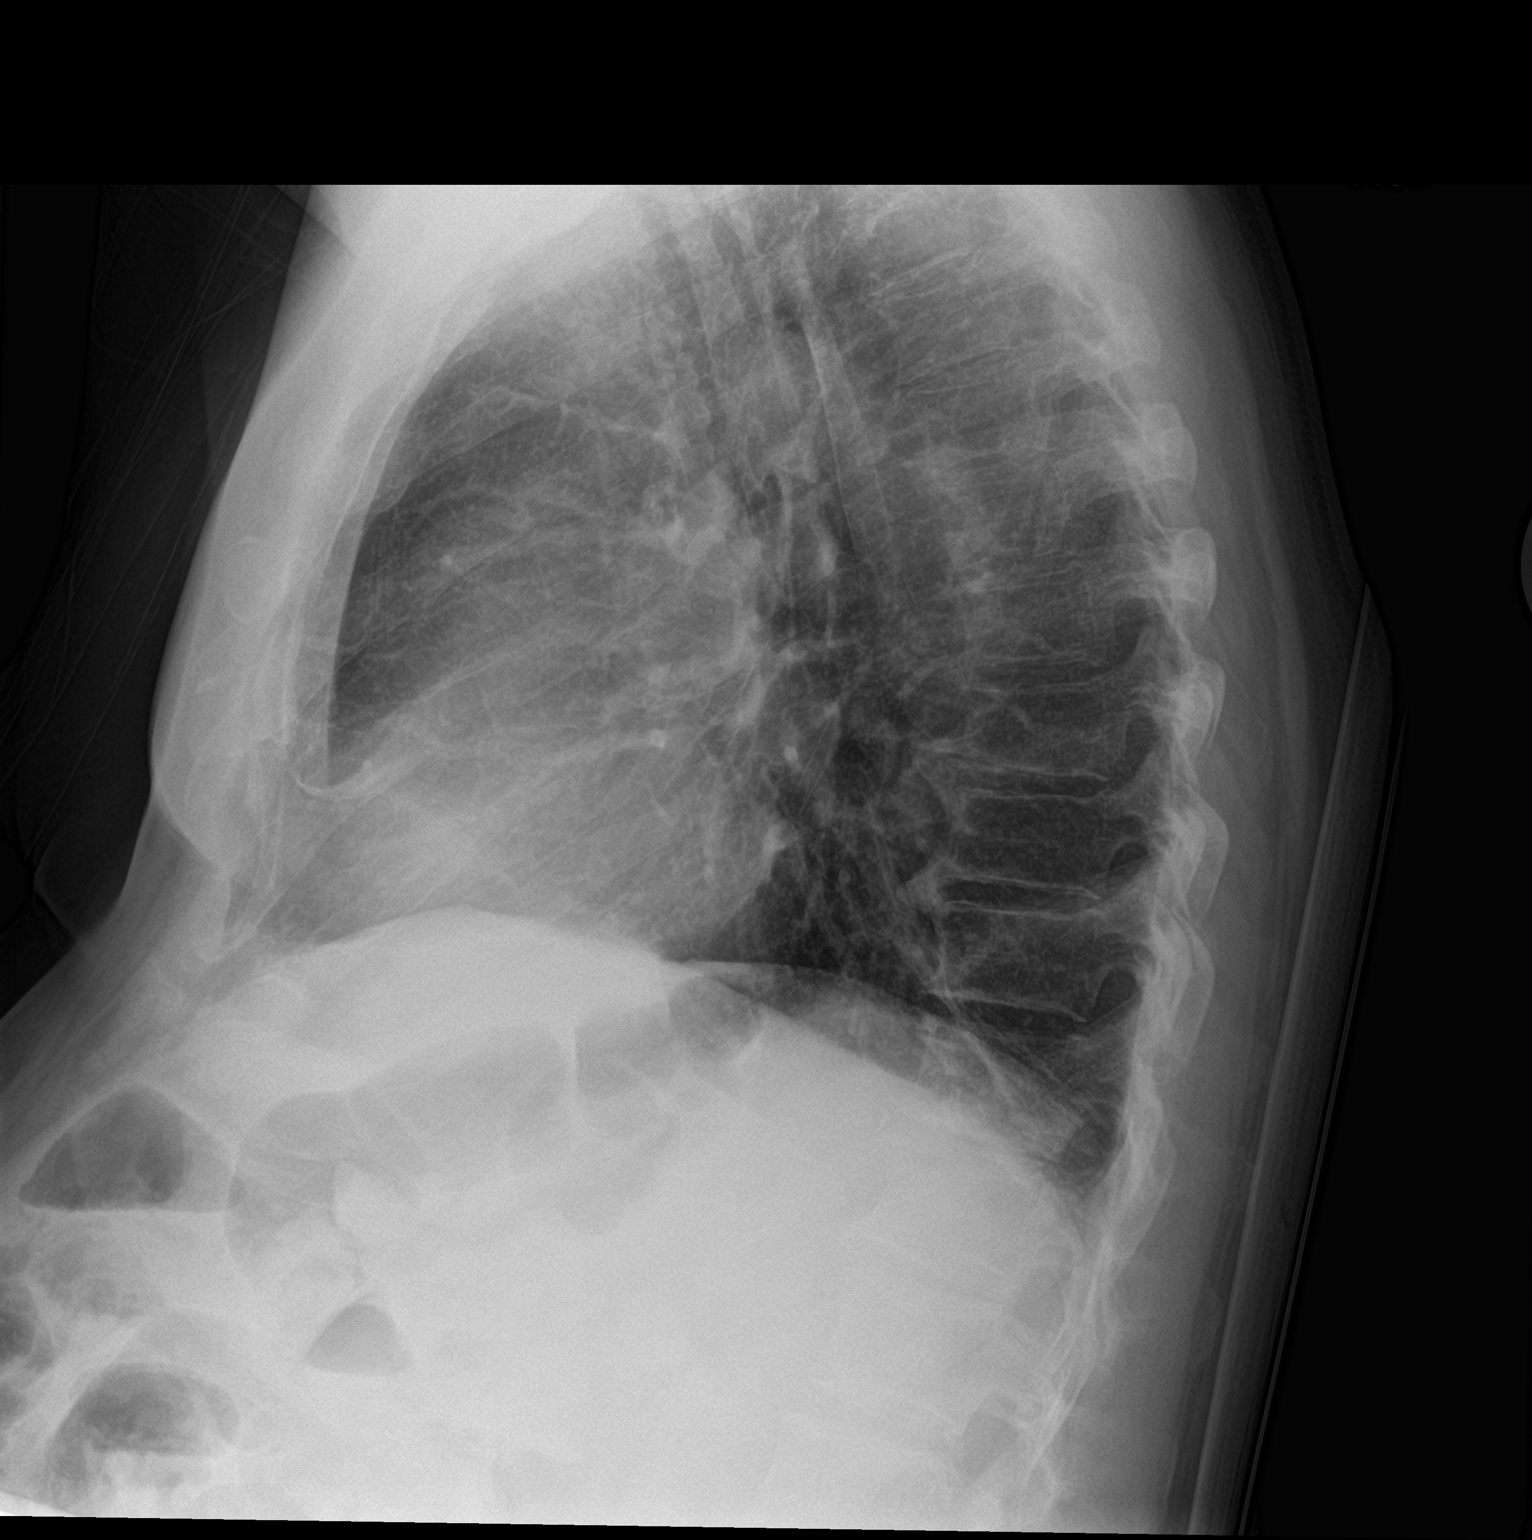

[chest ap]
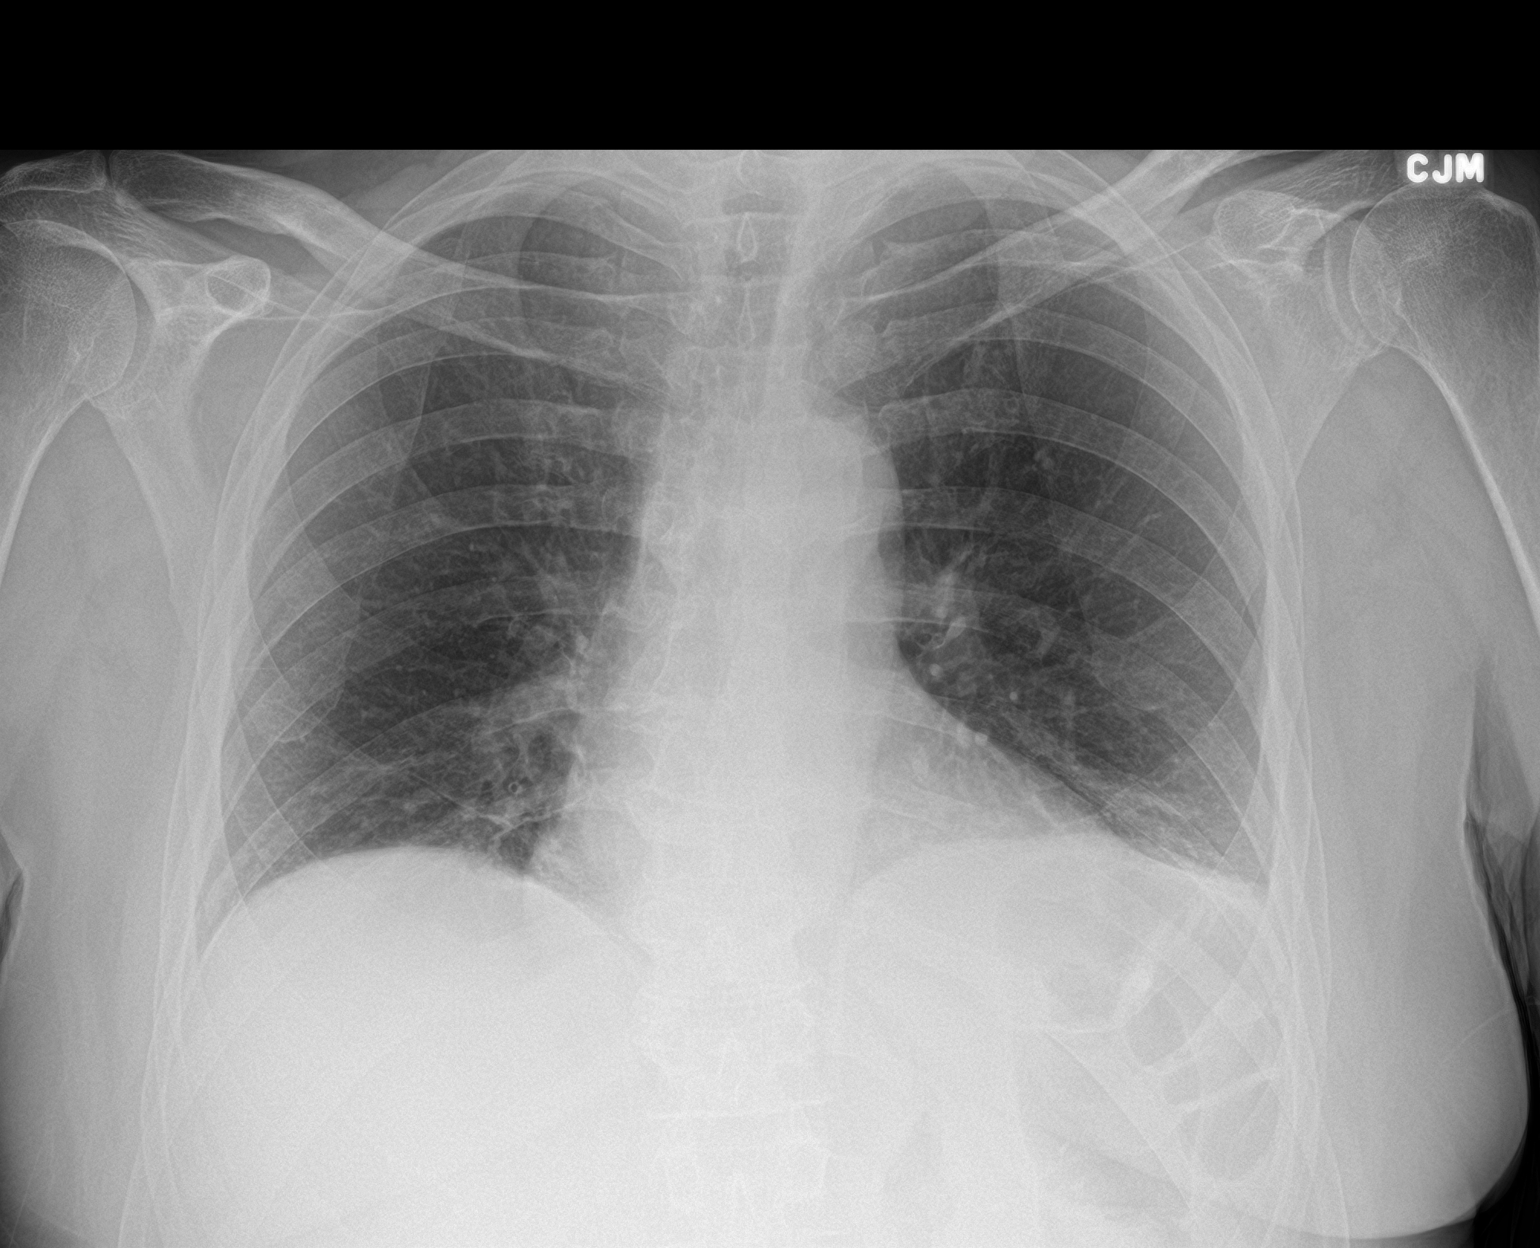

[2 of 2 positions shown; findings below may reference images not displayed]

FINDINGS: The heart size and mediastinal contours are within normal limits.
Hypoinflation of the lungs is noted with mild bibasilar subsegmental
atelectasis. The visualized skeletal structures are unremarkable.
IMPRESSION: Hypoinflation of the lungs with mild bibasilar subsegmental
atelectasis.

## 2022-04-30 ENCOUNTER — Ambulatory Visit: Payer: Medicaid Other | Admitting: Licensed Clinical Social Worker

## 2022-05-01 ENCOUNTER — Other Ambulatory Visit: Payer: Self-pay

## 2022-05-01 MED ORDER — FLUTICASONE FUROATE-VILANTEROL 200-25 MCG/ACT IN AEPB
INHALATION_SPRAY | RESPIRATORY_TRACT | 3 refills | Status: DC
  Filled 2022-05-14: qty 180, 90d supply, fill #0
  Filled 2022-10-14: qty 180, 90d supply, fill #1

## 2022-05-02 IMAGING — CR DG RIBS W/ CHEST 3+V*R*
3 series · 3 of 3 positions shown · non-contrast
Comparison: 07/28/2021

CLINICAL DATA: Chest pain after a fall several hours ago. Alcohol
and substance use. Right-sided chest pain.

EXAM:
RIGHT RIBS AND CHEST - 3+ VIEW

[chest ap]
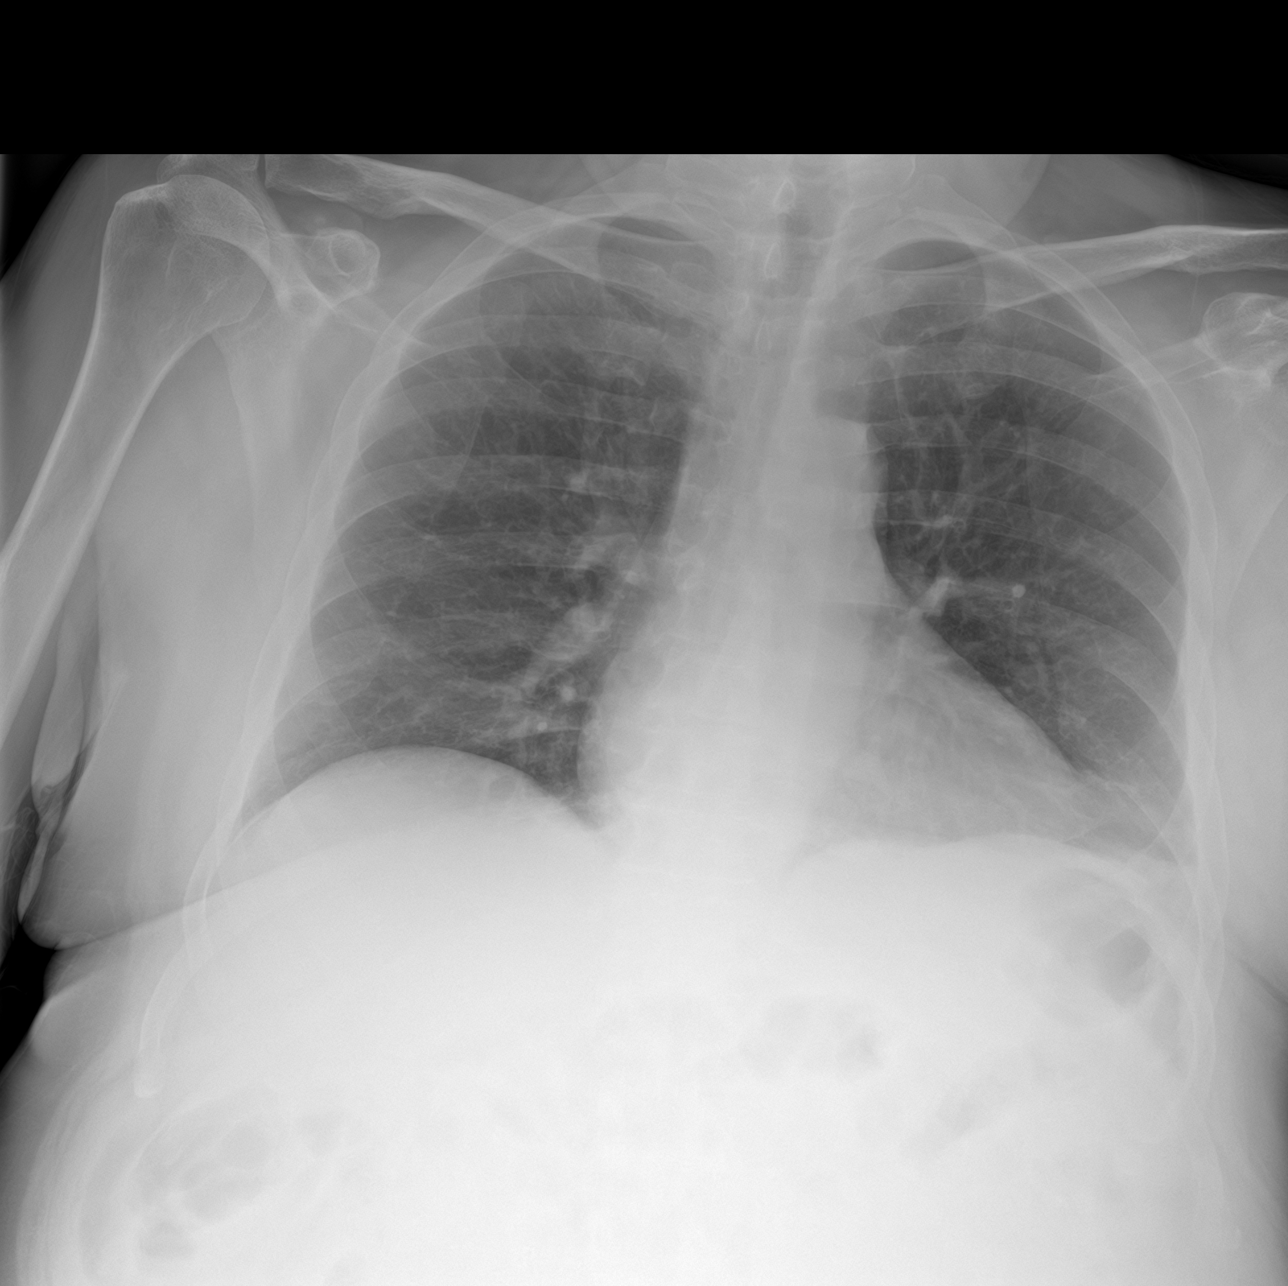

[rib ap]
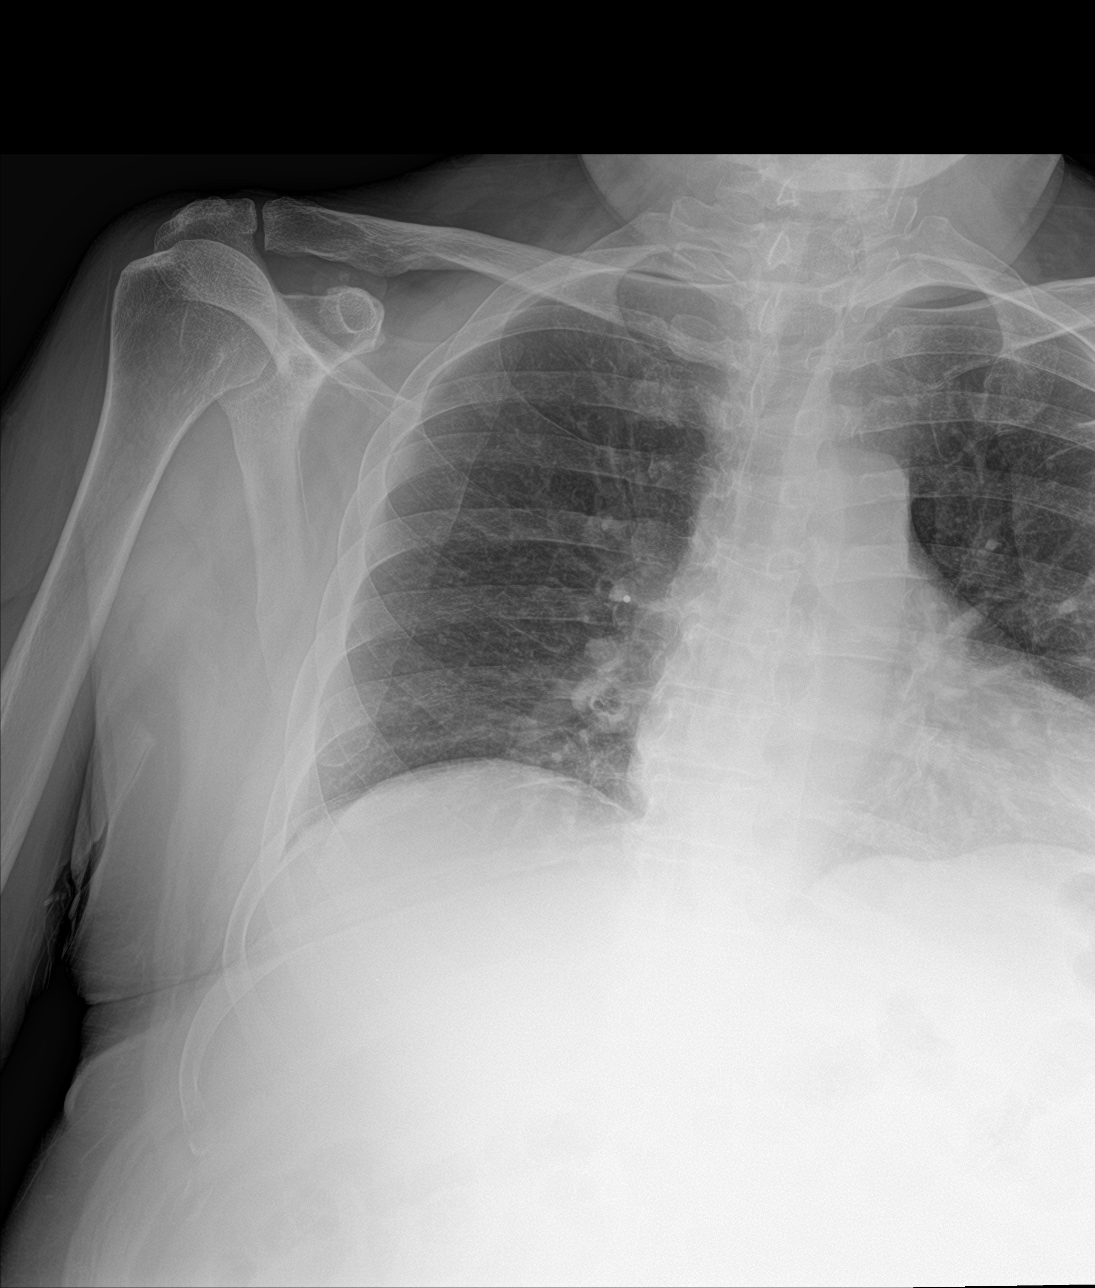

[rib ap obl]
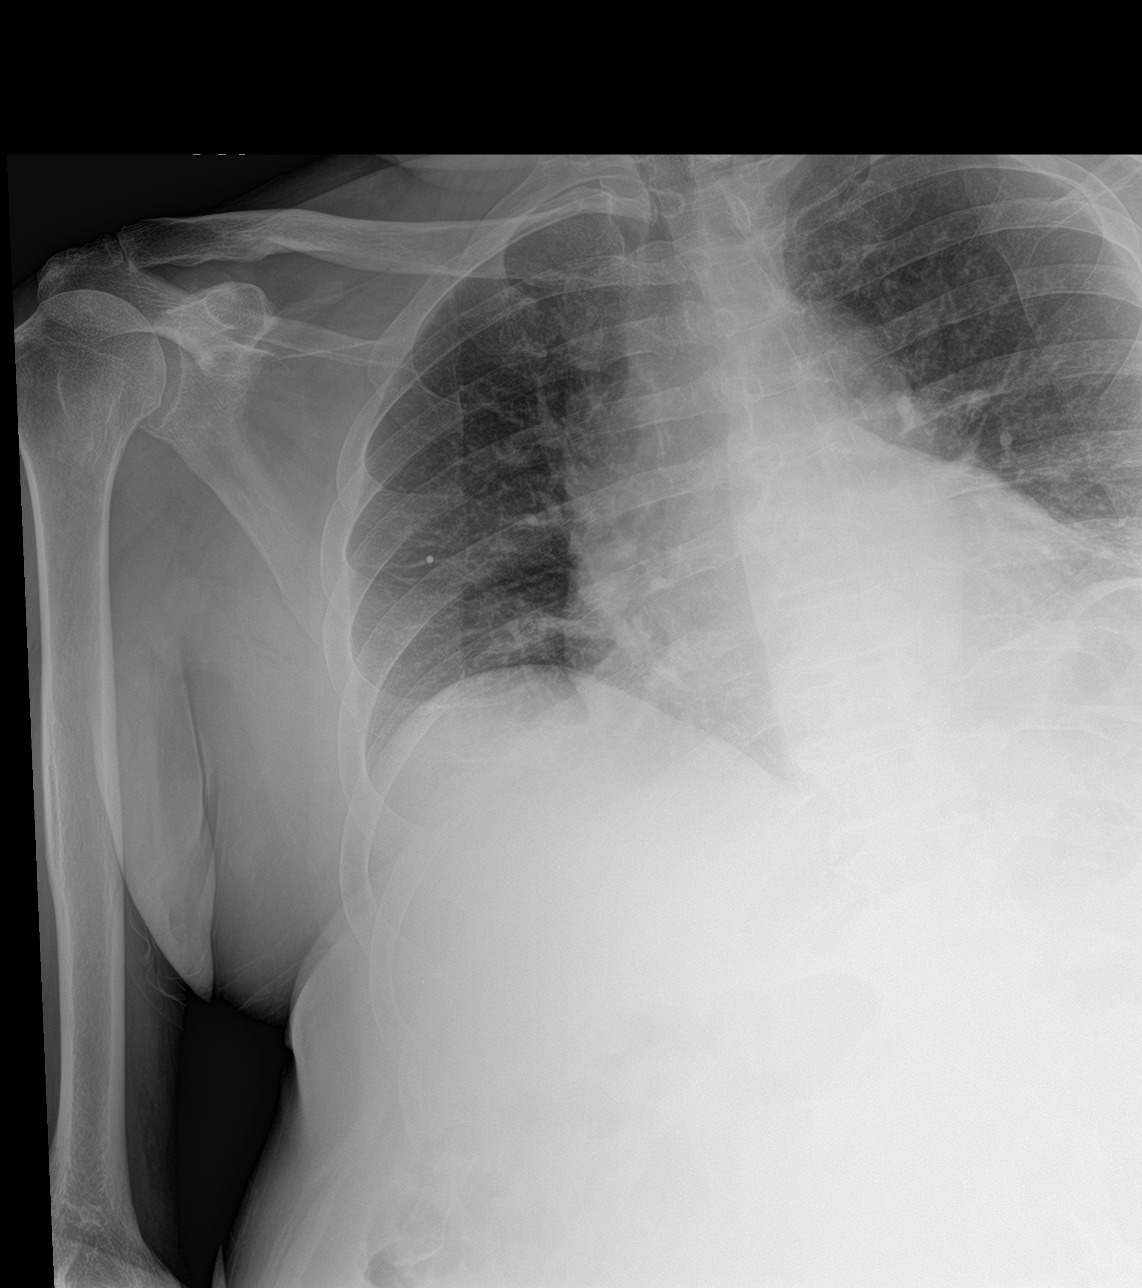

[3 of 3 positions shown; findings below may reference images not displayed]

FINDINGS: Shallow inspiration. Infiltration or atelectasis in the left base is
similar to prior study. No pleural effusions. No pneumothorax.
Mediastinal contours appear intact.

The right ribs appear intact. No acute displaced fractures
identified. No focal bone lesion or bone destruction. Soft tissues
are unremarkable.
IMPRESSION: Persistent infiltration or atelectasis in the left lung base without
change. Negative right ribs.

## 2022-05-02 IMAGING — CR DG SHOULDER 2+V*R*
4 series · 4 of 4 positions shown · non-contrast
Comparison: 11/09/2020,

CLINICAL DATA: Fall

EXAM:
RIGHT SHOULDER - 2+ VIEW

[shoulder grashey]
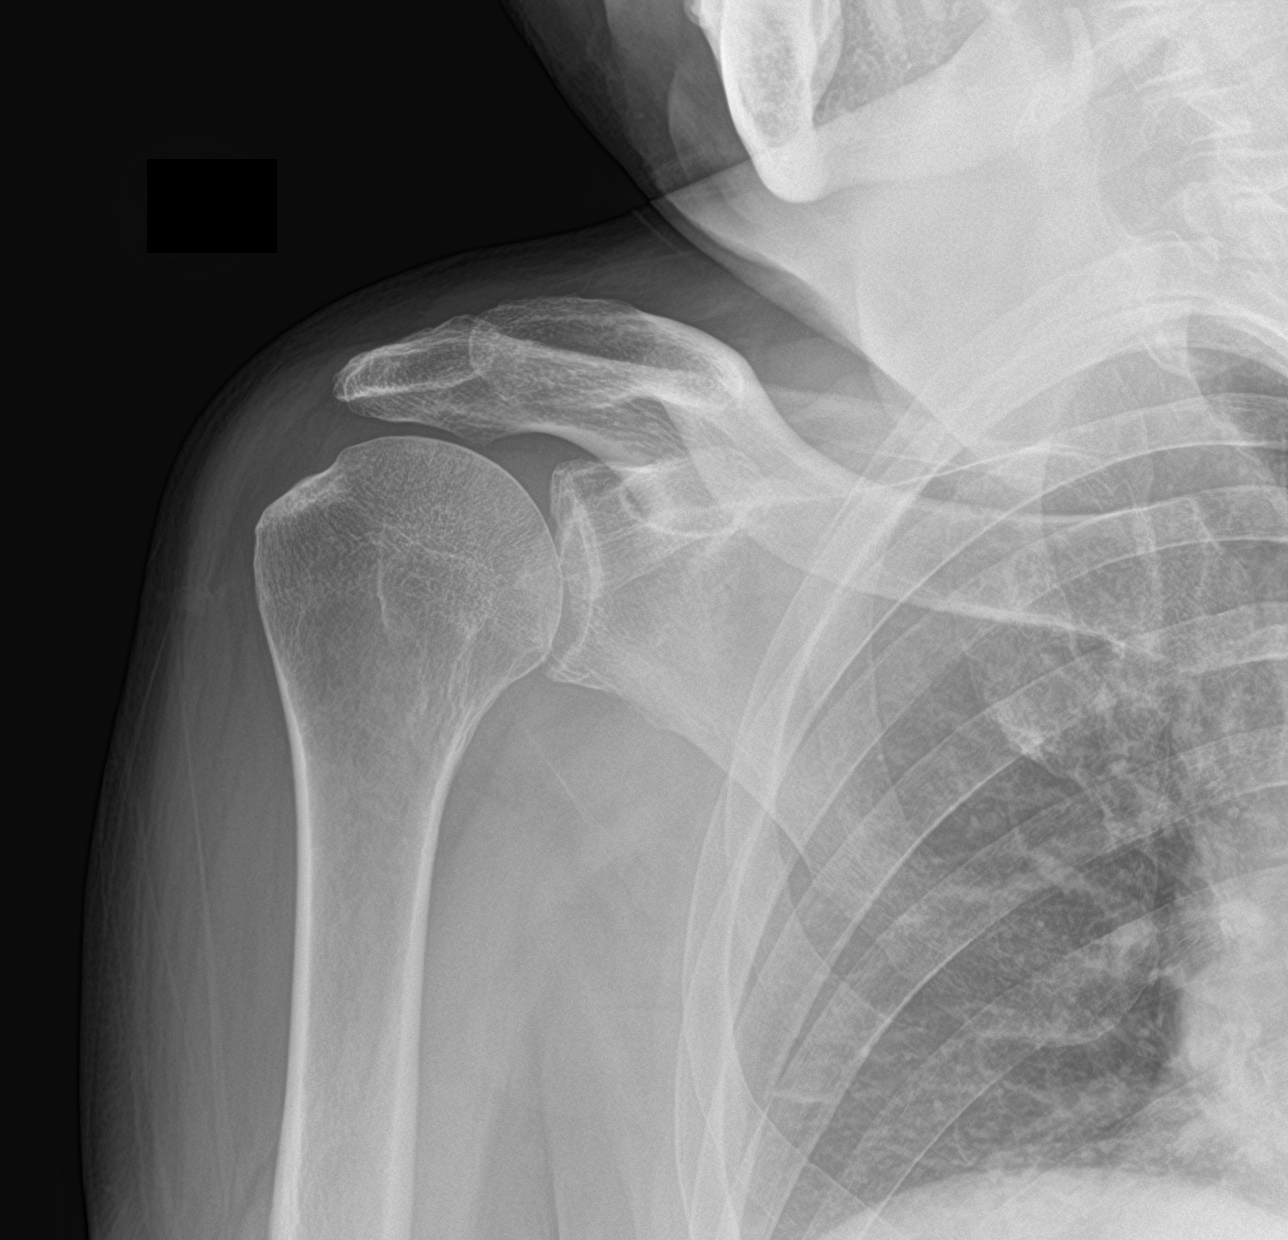

[shoulder y view]
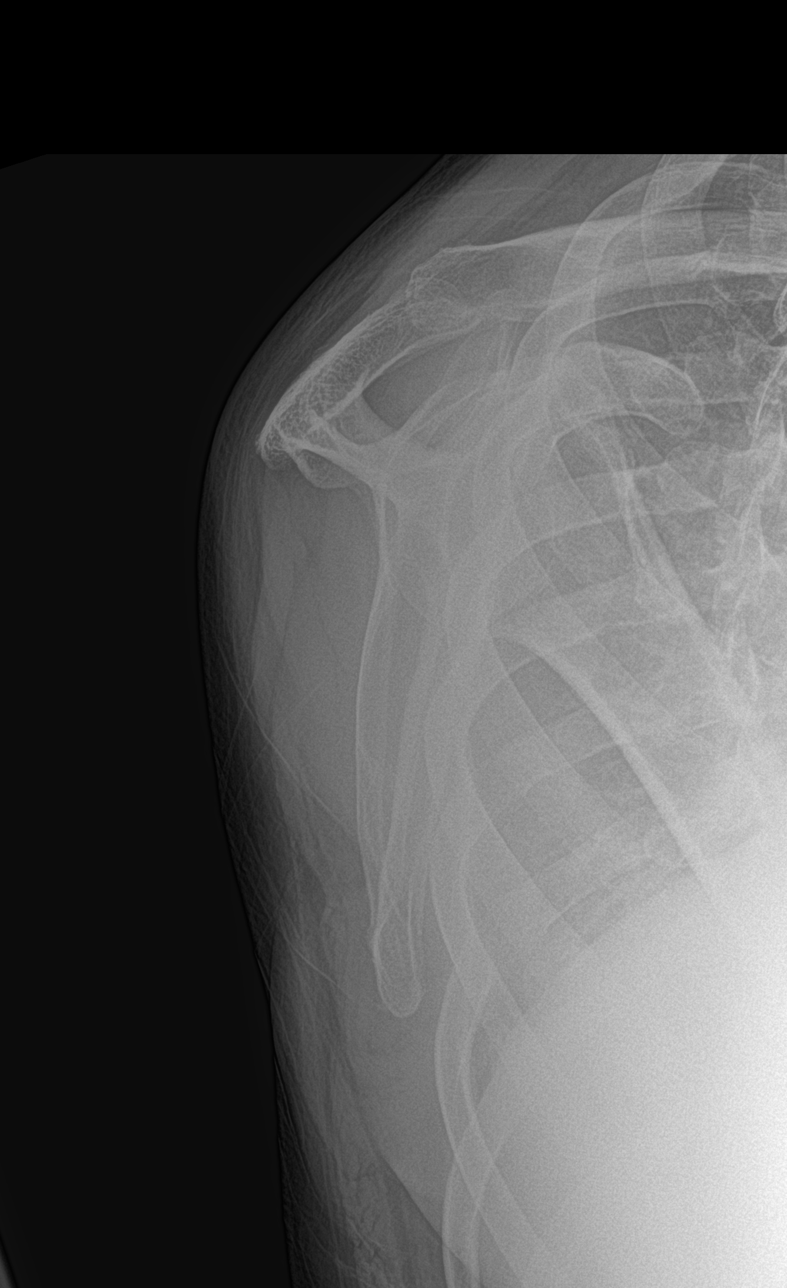

[shoulder axillary]
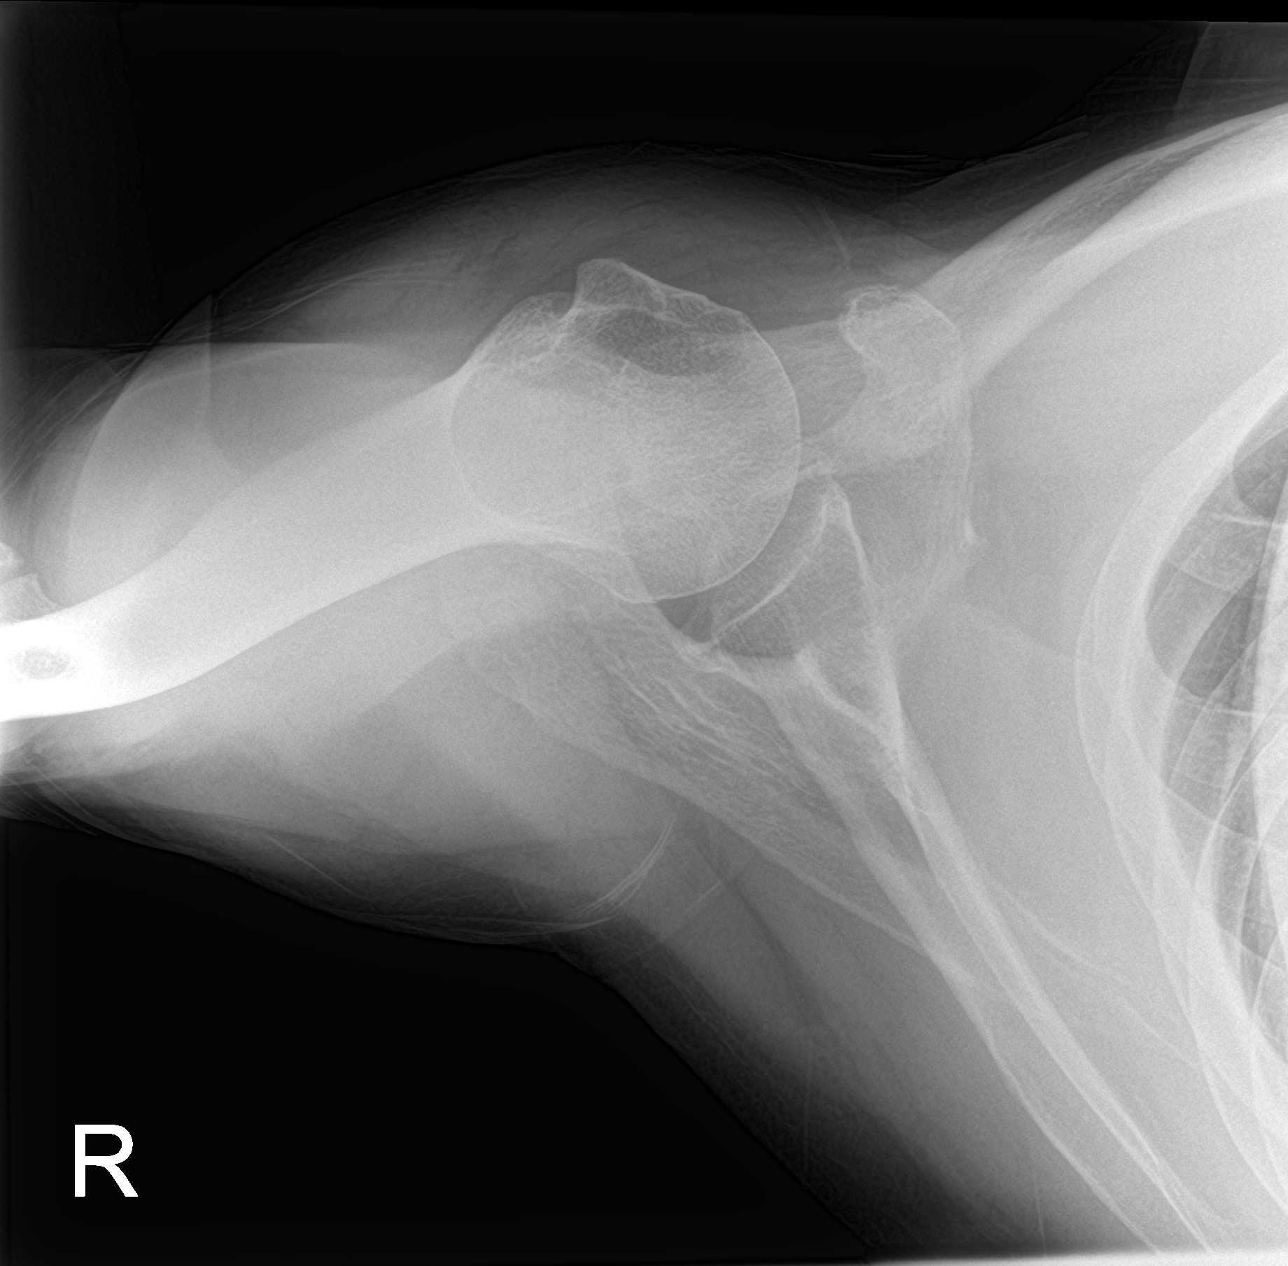

[shoulder ap neutral]
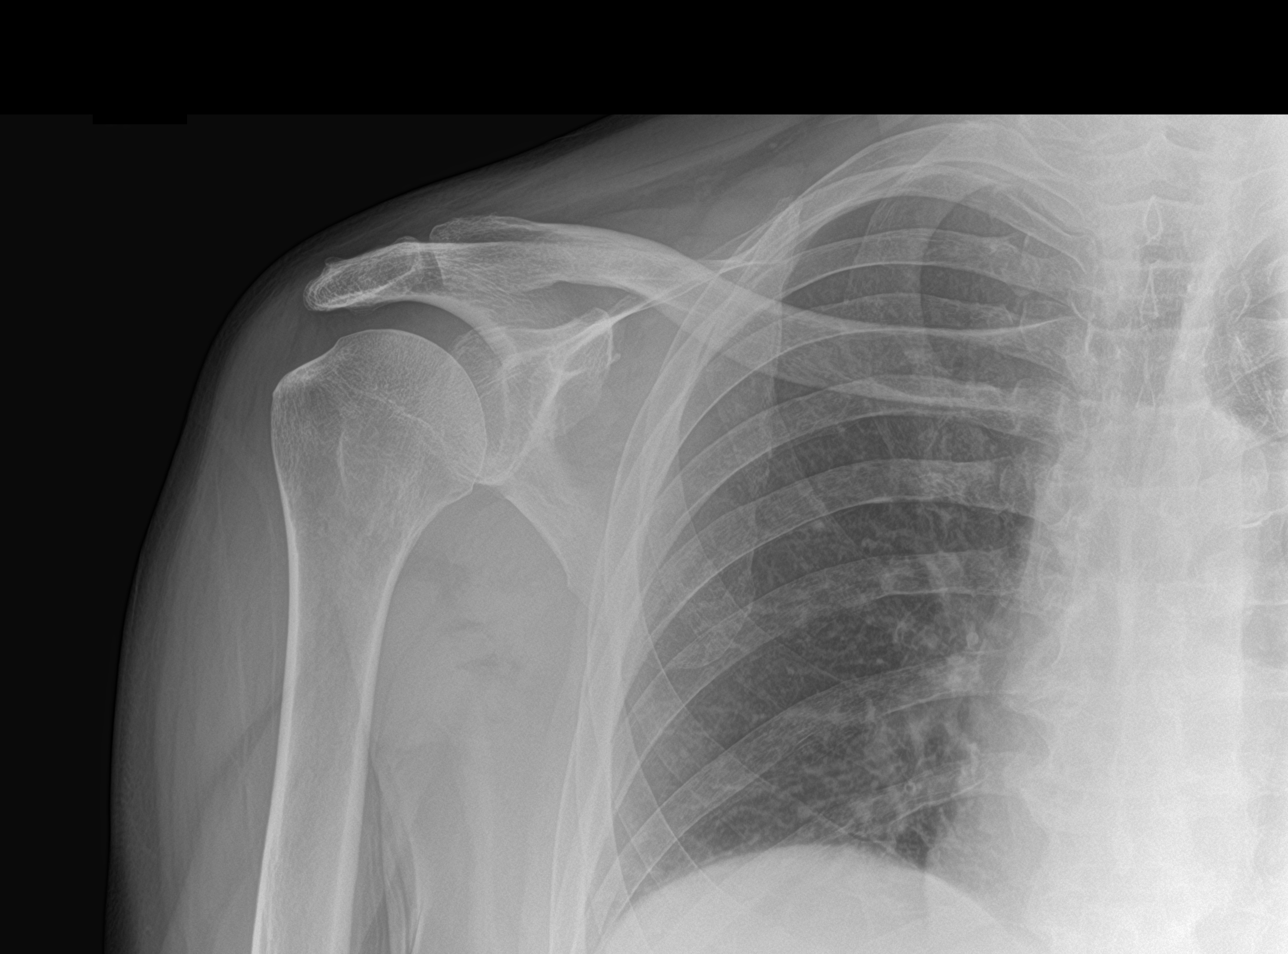

[4 of 4 positions shown; findings below may reference images not displayed]

FINDINGS: AC joint is intact. No fracture or malalignment. Right lung apex is
clear
IMPRESSION: No acute osseous abnormality

## 2022-05-02 IMAGING — CT CT CERVICAL SPINE W/O CM
3 of 4 series · 12 of 35 positions shown, 14 images · non-contrast
Comparison: 07/22/2021

CLINICAL DATA: Neck trauma, dangerous injury mechanism (Age 16-64y)

EXAM:
CT CERVICAL SPINE WITHOUT CONTRAST
TECHNIQUE: Multidetector CT imaging of the cervical spine was performed without
intravenous contrast. Multiplanar CT image reconstructions were also
generated.

[Series 4: sagittal bone · sagittal · 0.42mm/px · 5 of 97 slices shown, 6 images]
[im 33/97  bone]
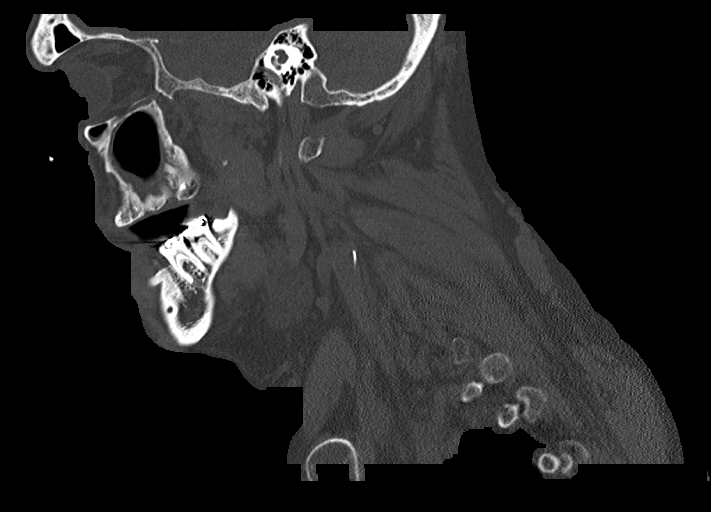
[im 41/97  bone]
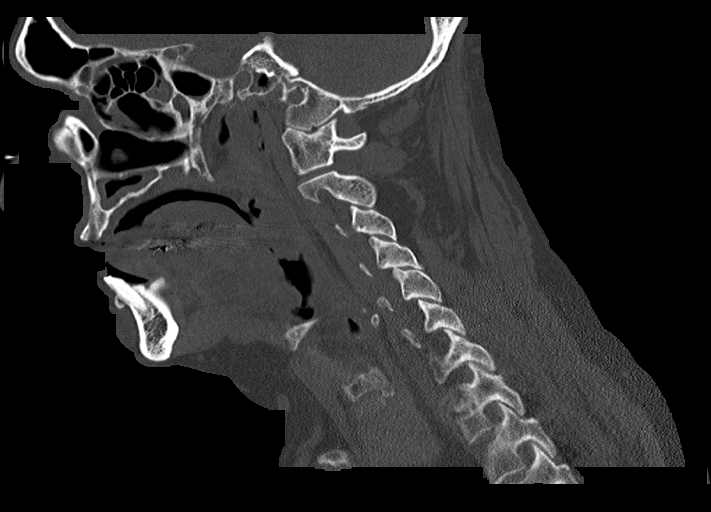
[im 49/97  soft-tissue]
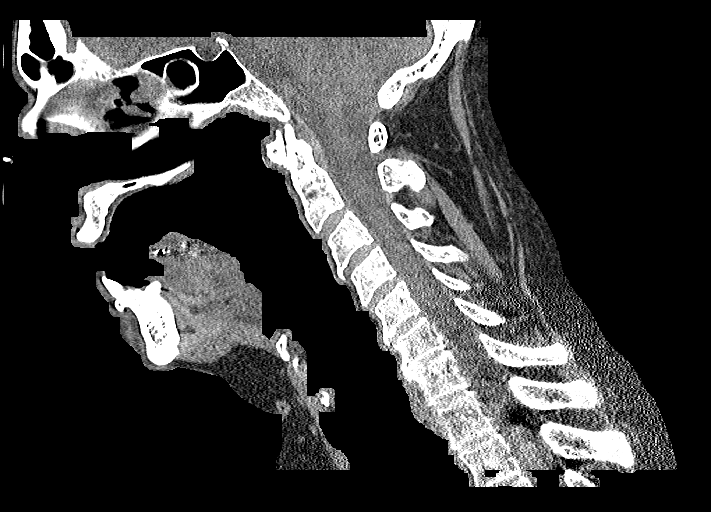
[im 49/97  bone]
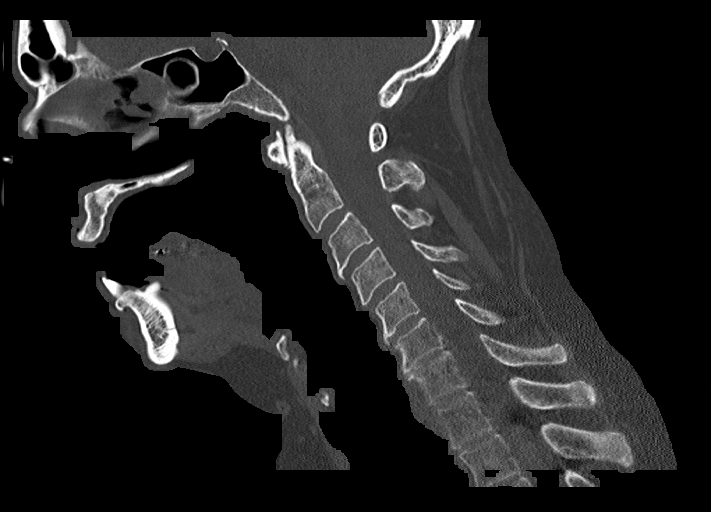
[im 57/97  bone]
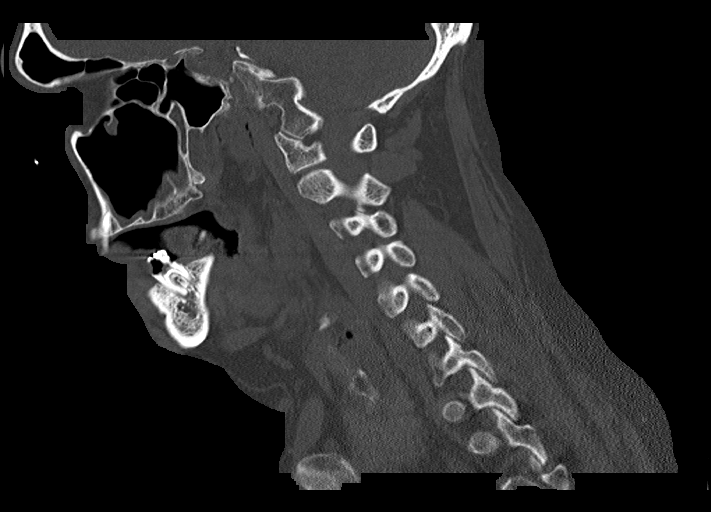
[im 65/97  bone]
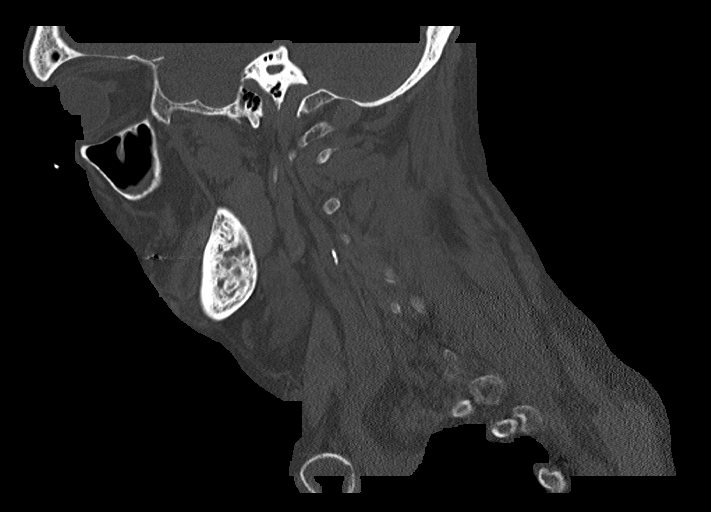

[Series 5: coronal bone · coronal · 0.38mm/px · 3 of 92 slices shown]
[im 32/92  bone]
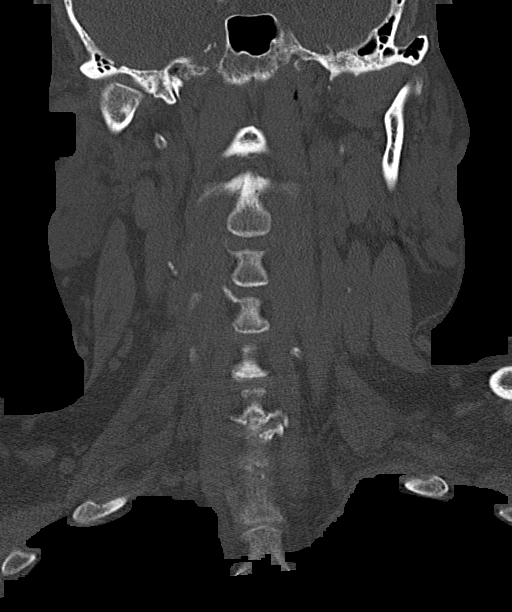
[im 41/92  bone]
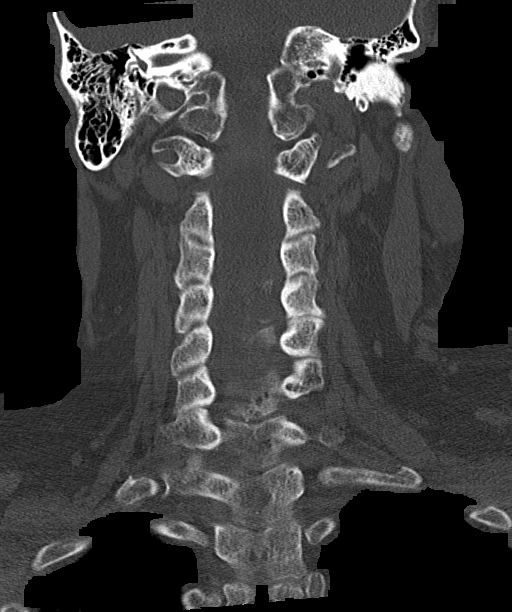
[im 51/92  bone]
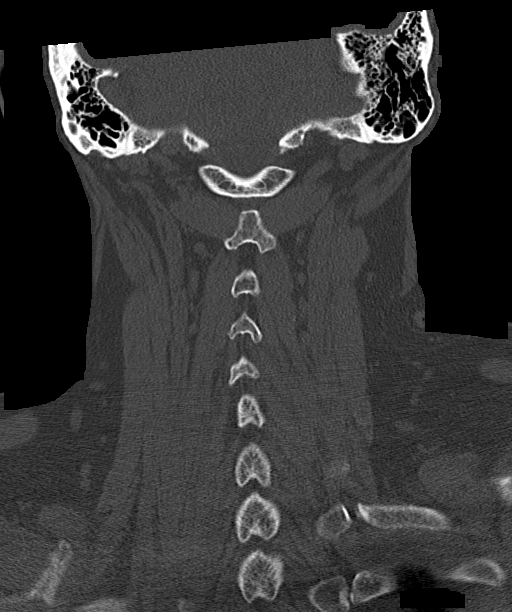

[Series 6: orthogonal bone · axial · 0.31mm/px · z∈[-293,-124]mm · 4 of 143 slices shown, 5 images]
[im 21/143  soft-tissue]
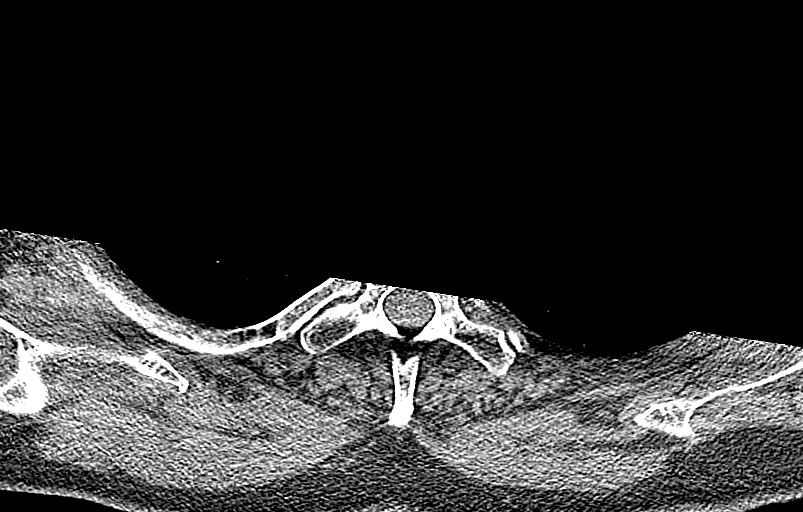
[im 21/143  bone]
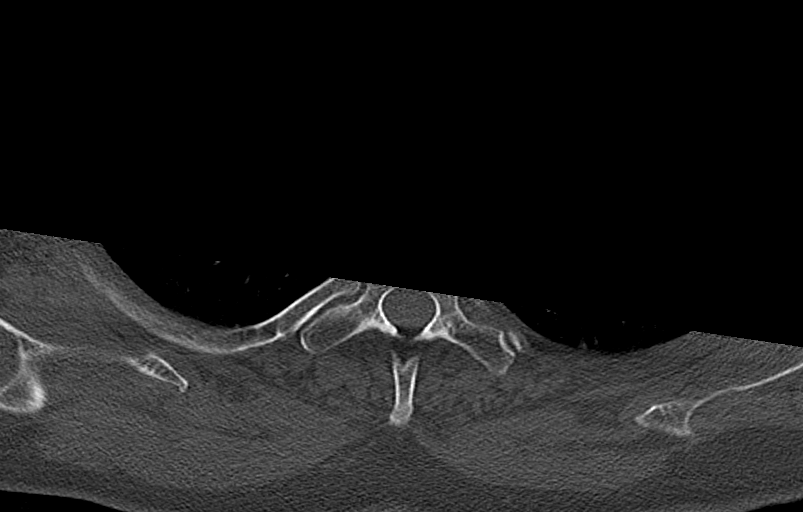
[im 61/143  bone]
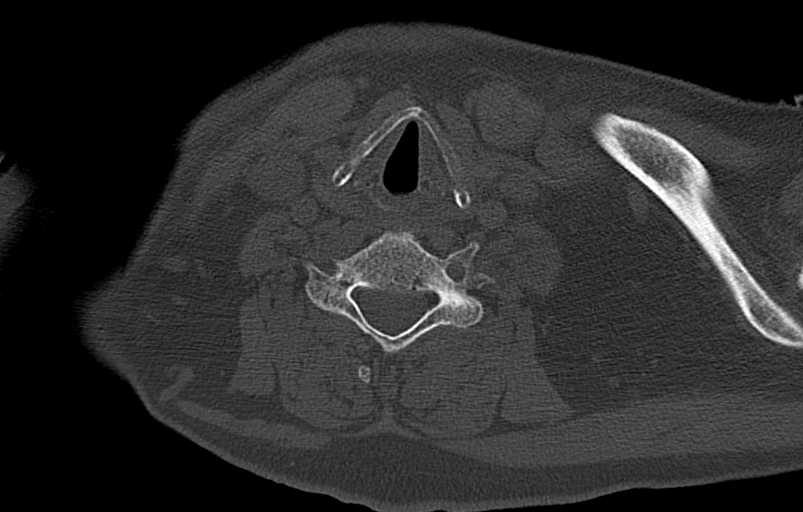
[im 82/143  bone]
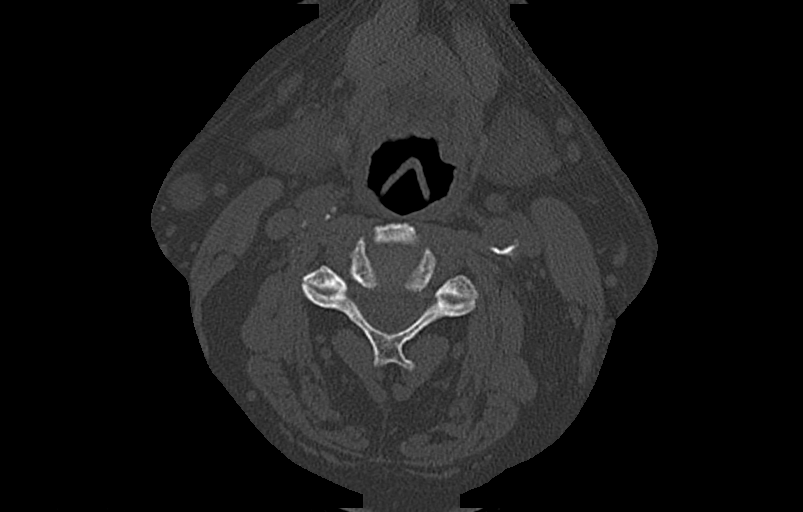
[im 122/143  bone]
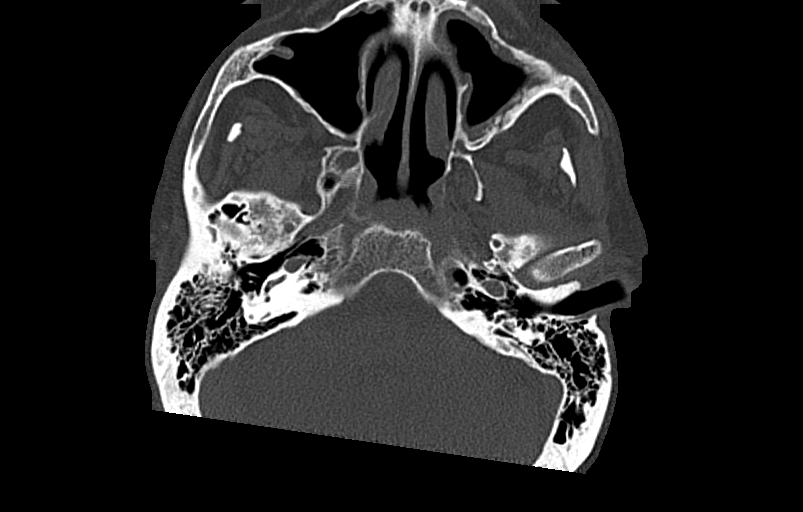

[12 of 35 positions shown; findings below may reference images not displayed]

FINDINGS: Alignment: Stable.

Skull base and vertebrae: Stable vertebral body heights. No acute
fracture.

Soft tissues and spinal canal: No prevertebral fluid or swelling. No
visible canal hematoma.

Disc levels:  Stable appearance with minor degenerative changes.

Upper chest: No new finding.

Other: None.
IMPRESSION: No acute cervical spine fracture.

## 2022-05-02 IMAGING — CT CT HEAD W/O CM
3 series · 15 of 47 positions shown, 18 images · non-contrast
Comparison: 07/22/2021

CLINICAL DATA: Head trauma, focal neuro findings (Age 19-64y)

EXAM:
CT HEAD WITHOUT CONTRAST
TECHNIQUE: Contiguous axial images were obtained from the base of the skull
through the vertex without intravenous contrast.

[Series 2: head wo · axial · 0.45mm/px · z∈[-90,+60]mm · 9 of 36 slices shown, 12 images]
[im 3/36  brain]
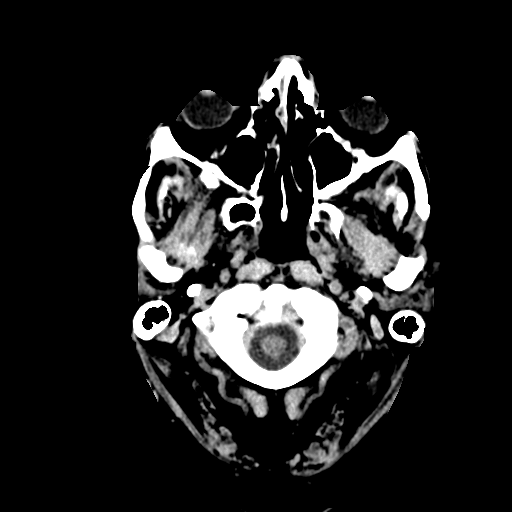
[im 3/36  bone]
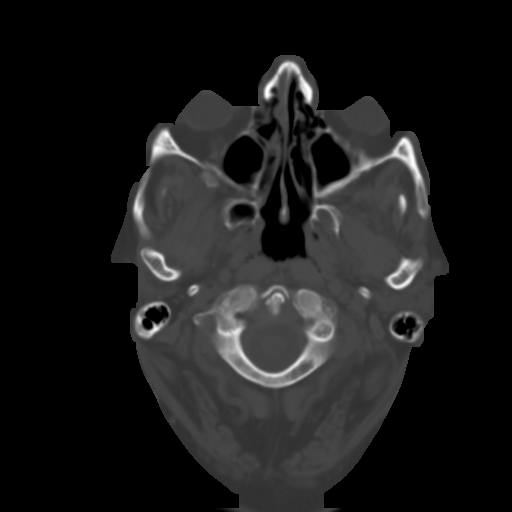
[im 7/36  brain]
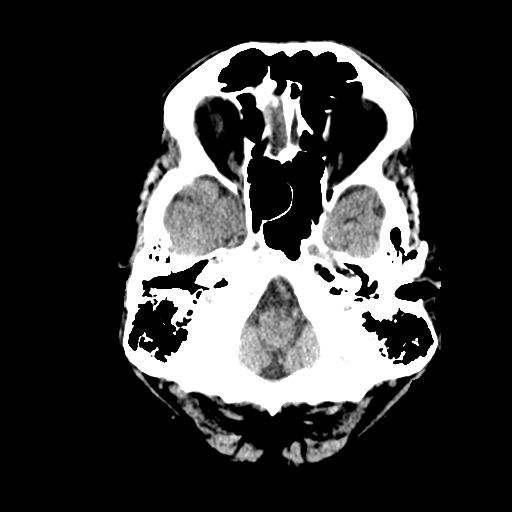
[im 10/36  brain]
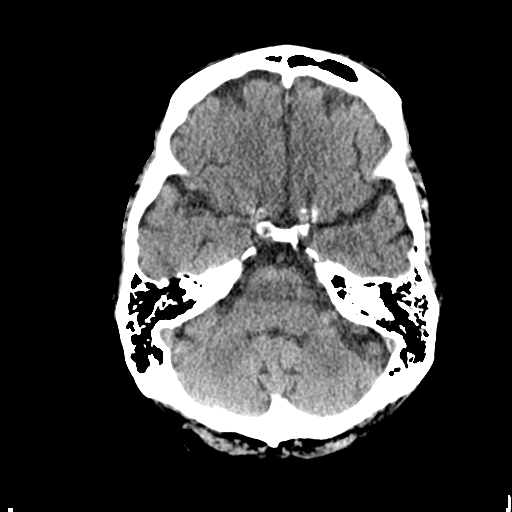
[im 14/36  brain]
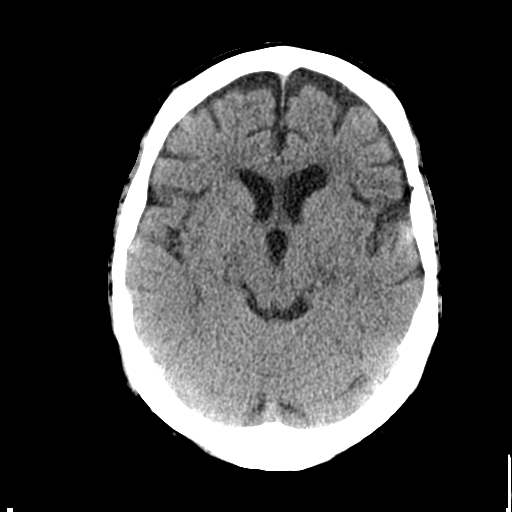
[im 19/36  brain]
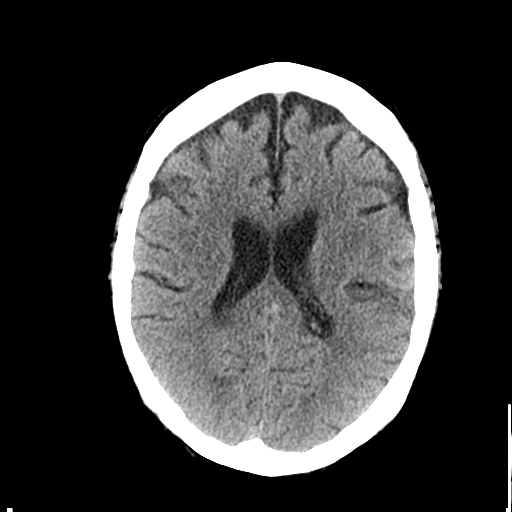
[im 19/36  bone]
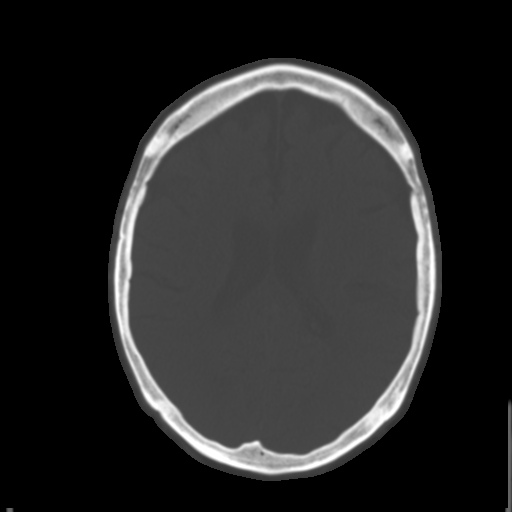
[im 22/36  brain]
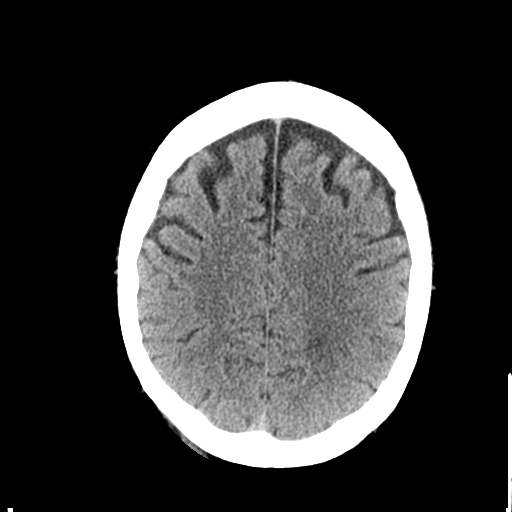
[im 26/36  brain]
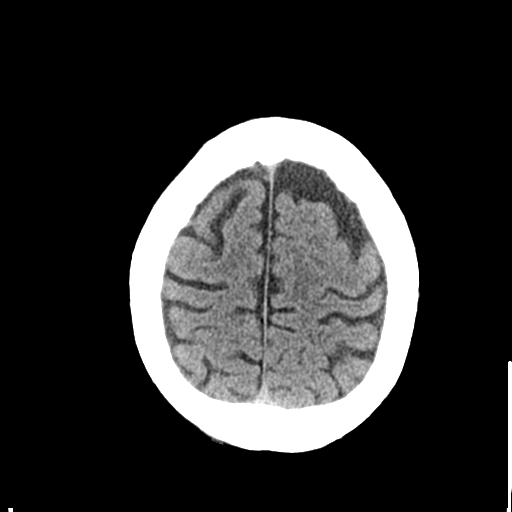
[im 29/36  brain]
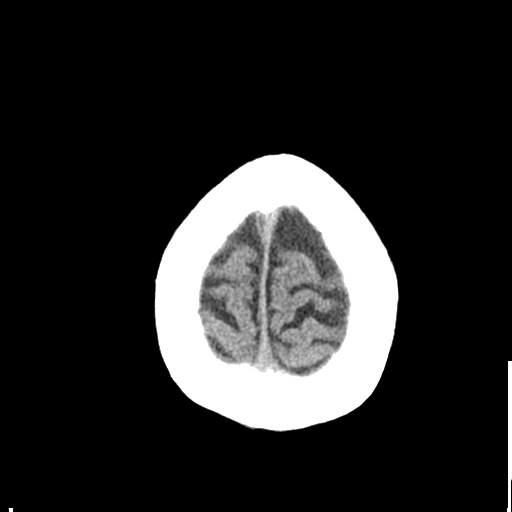
[im 33/36  brain]
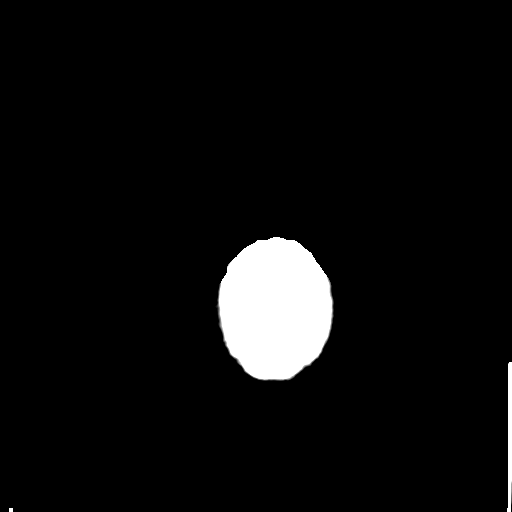
[im 33/36  bone]
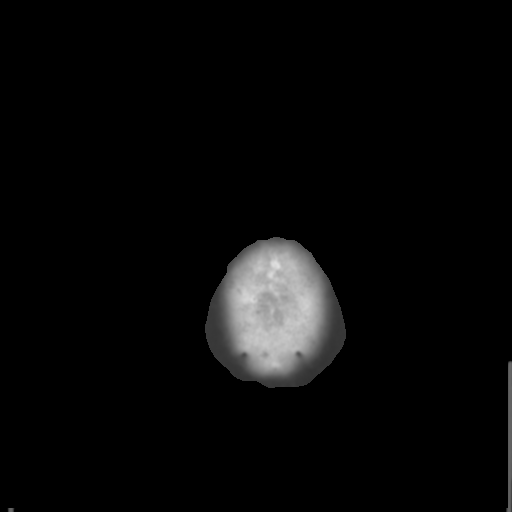

[Series 4: coronal soft tissue · coronal · 0.34mm/px · 3 of 77 slices shown]
[im 26/77  brain]
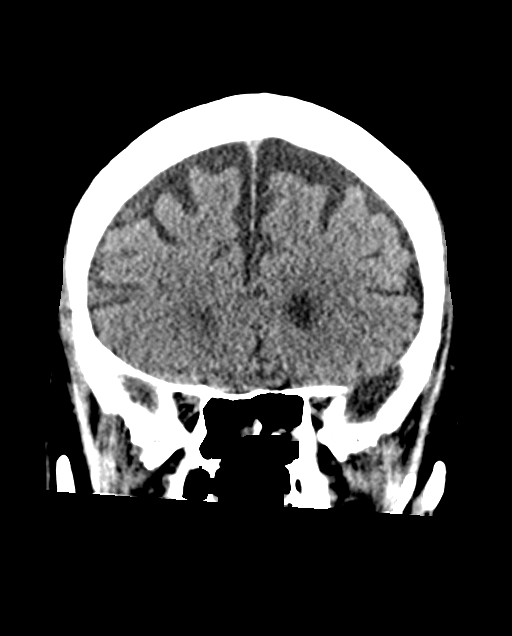
[im 34/77  brain]
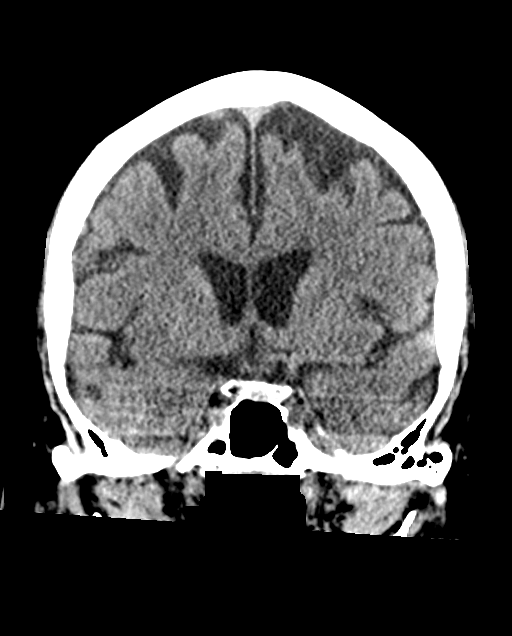
[im 43/77  brain]
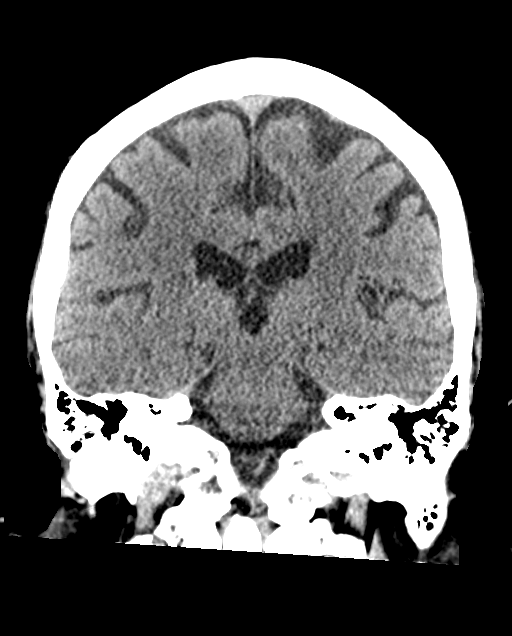

[Series 5: sagittal soft tissue · sagittal · 0.37mm/px · 3 of 57 slices shown]
[im 19/57  brain]
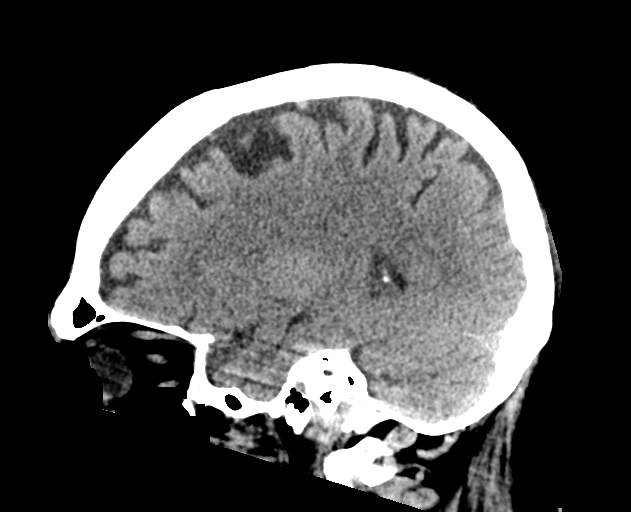
[im 29/57  brain]
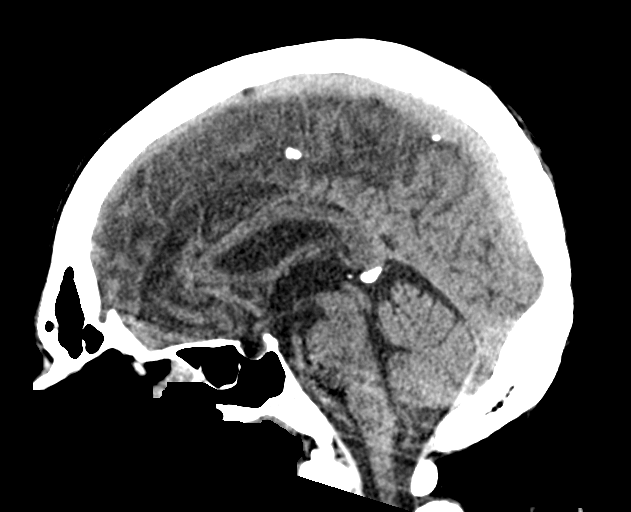
[im 38/57  brain]
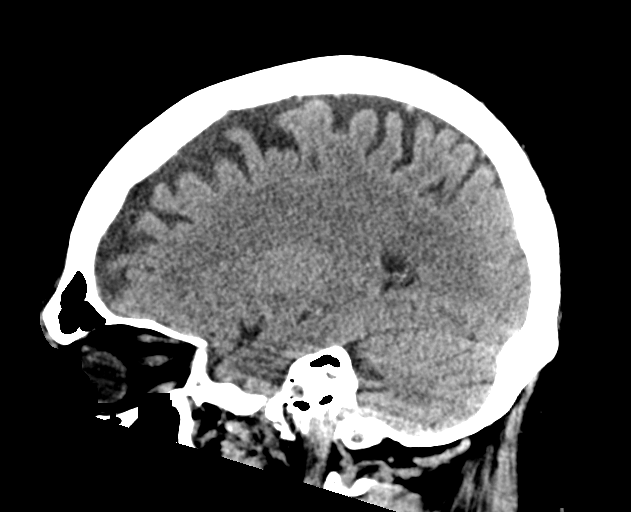

[15 of 47 positions shown; findings below may reference images not displayed]

FINDINGS: Brain: There is no acute intracranial hemorrhage, mass effect, or
edema. Gray-white differentiation is preserved. There is no
extra-axial fluid collection. Ventricles and sulci are stable in
size and configuration.

Vascular: No hyperdense vessel or unexpected calcification.

Skull: Calvarium is unremarkable.

Sinuses/Orbits: No acute finding.

Other: None.
IMPRESSION: No evidence of acute intracranial injury. No significant change
since recent prior study.

## 2022-05-05 ENCOUNTER — Ambulatory Visit: Payer: Medicaid Other | Admitting: Licensed Clinical Social Worker

## 2022-05-05 IMAGING — CT CT HEAD W/O CM
3 series · 16 of 47 positions shown, 19 images · non-contrast
Comparison: 08/01/2021

CLINICAL DATA: Minor head trauma. Trip and fall injury. Struck back
of head. Loss of consciousness. Headache.

EXAM:
CT HEAD WITHOUT CONTRAST
TECHNIQUE: Contiguous axial images were obtained from the base of the skull
through the vertex without intravenous contrast.

[Series 3: head wo · axial · 0.48mm/px · z∈[+367,+507]mm · 10 of 34 slices shown, 13 images]
[im 3/34  brain]
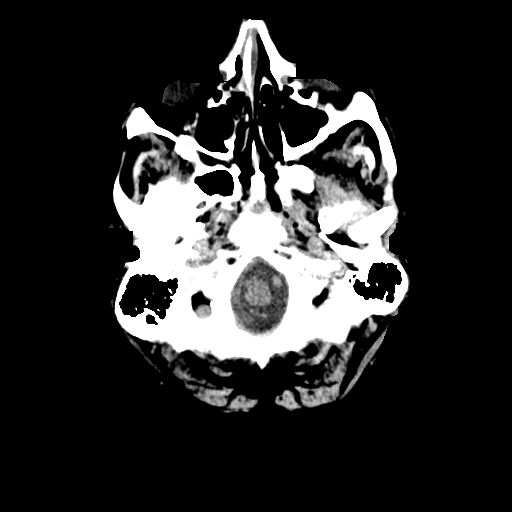
[im 3/34  bone]
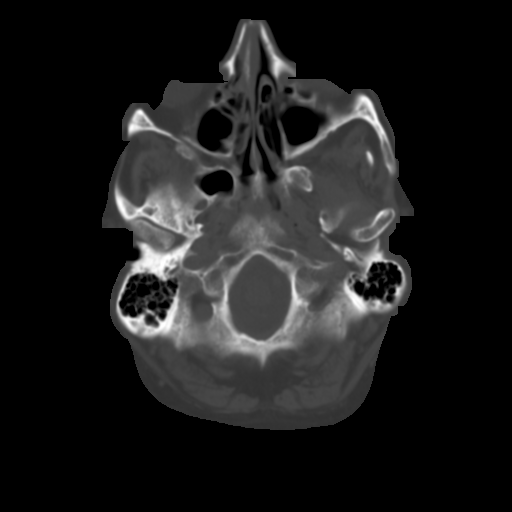
[im 6/34  brain]
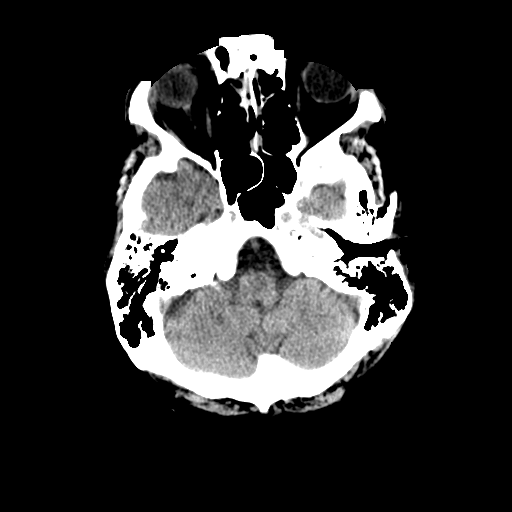
[im 10/34  brain]
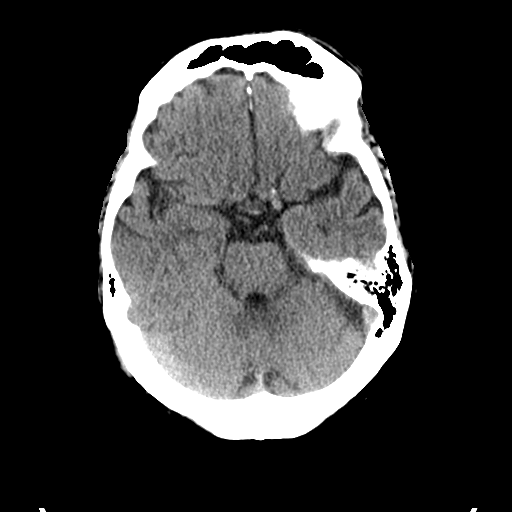
[im 12/34  brain]
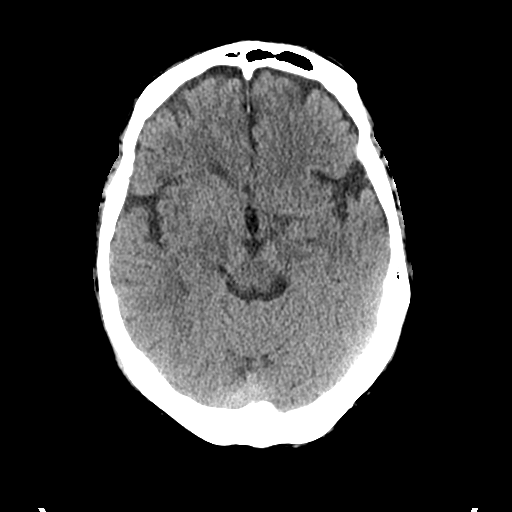
[im 15/34  brain]
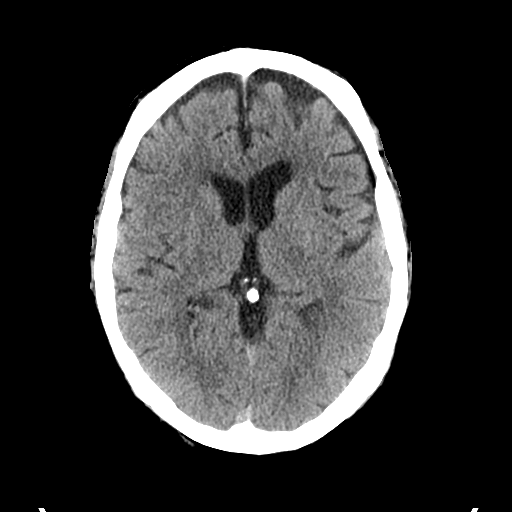
[im 15/34  bone]
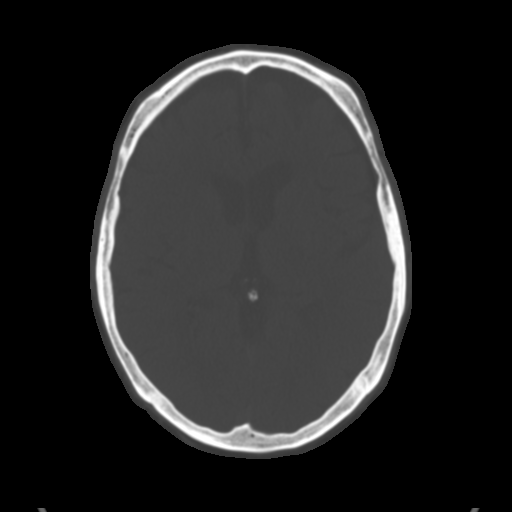
[im 19/34  brain]
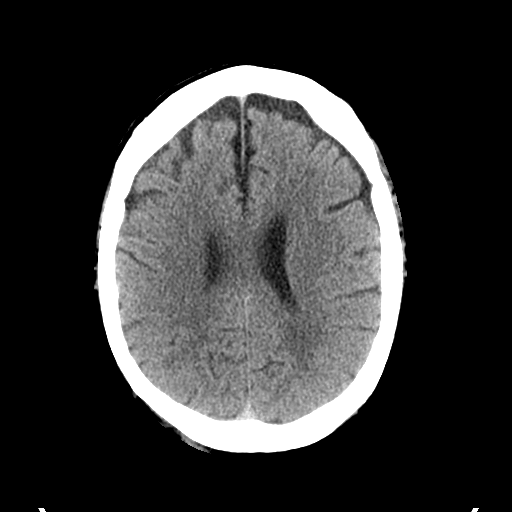
[im 22/34  brain]
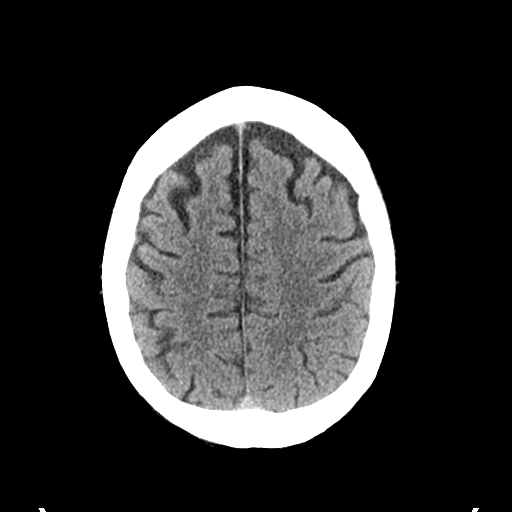
[im 26/34  brain]
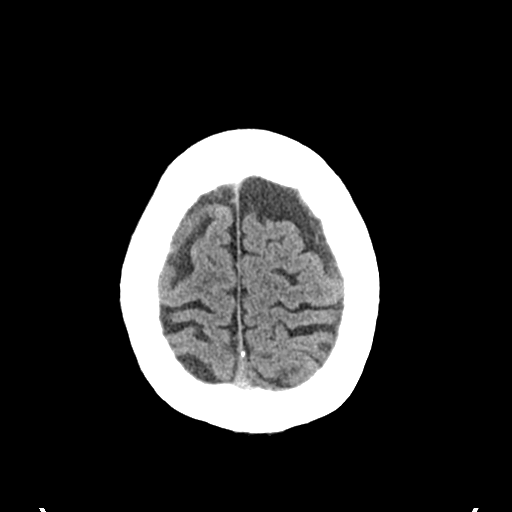
[im 28/34  brain]
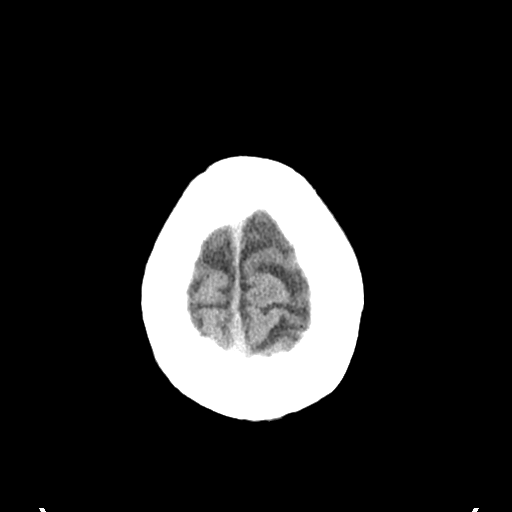
[im 28/34  bone]
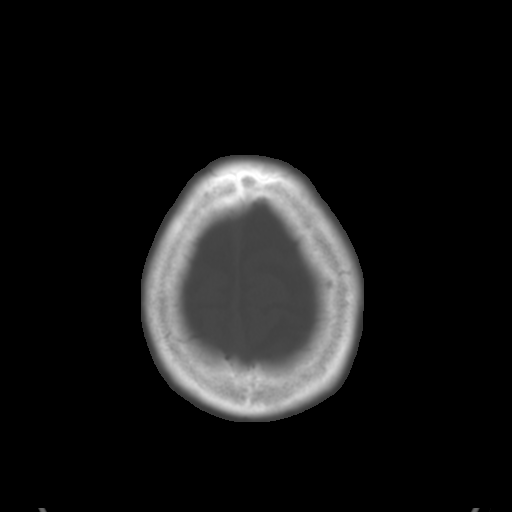
[im 31/34  brain]
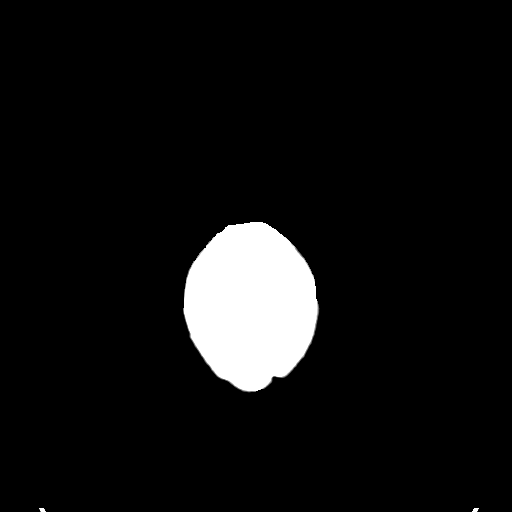

[Series 4: coronal soft tissue · coronal · 0.34mm/px · 3 of 70 slices shown]
[im 24/70  brain]
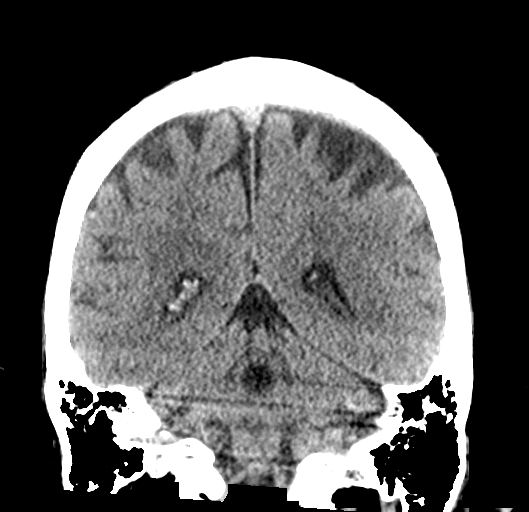
[im 31/70  brain]
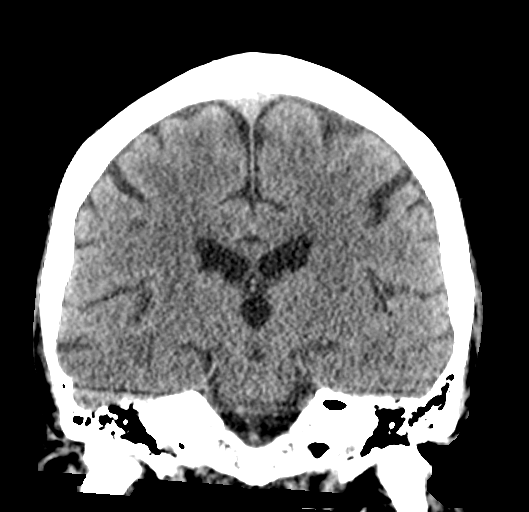
[im 39/70  brain]
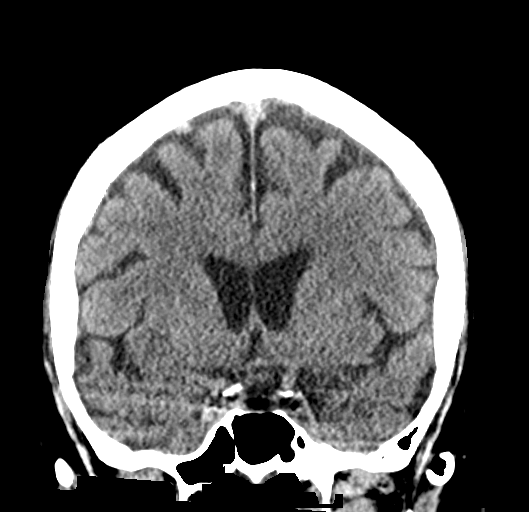

[Series 5: sagittal soft tissue · sagittal · 0.34mm/px · 3 of 61 slices shown]
[im 21/61  brain]
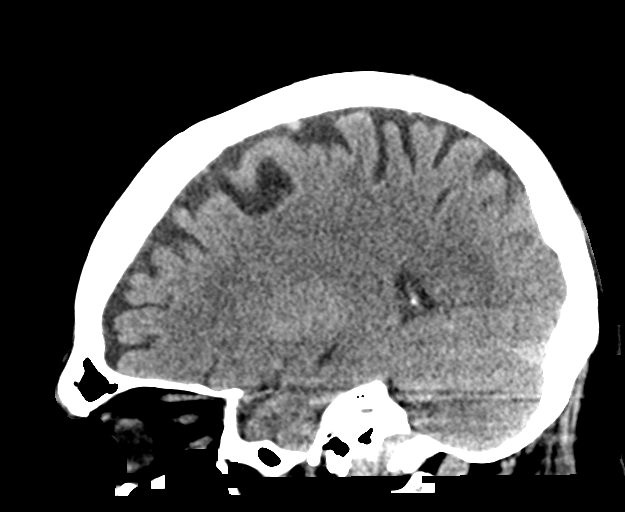
[im 31/61  brain]
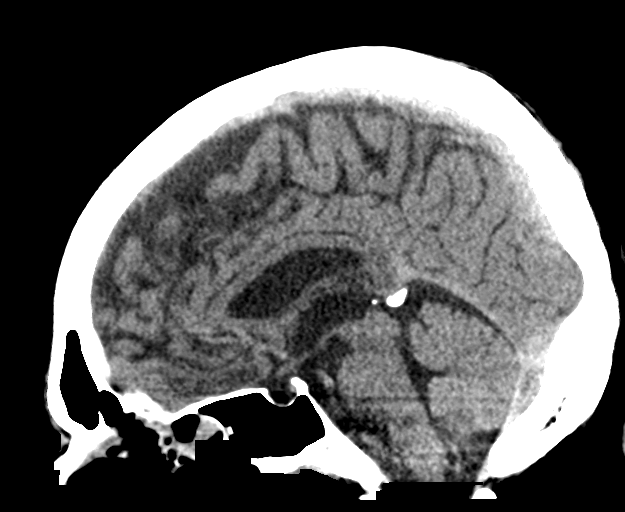
[im 41/61  brain]
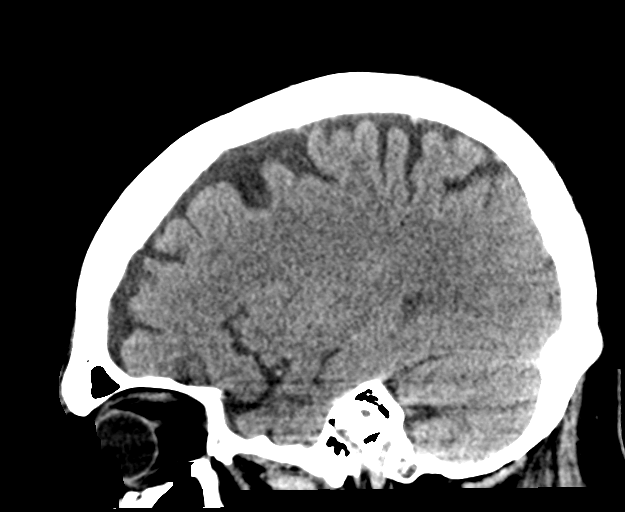

[16 of 47 positions shown; findings below may reference images not displayed]

FINDINGS: Brain: No evidence of acute infarction, hemorrhage, hydrocephalus,
extra-axial collection or mass lesion/mass effect.

Vascular: No hyperdense vessel or unexpected calcification.

Skull: Normal. Negative for fracture or focal lesion.

Sinuses/Orbits: Mucosal thickening in the paranasal sinuses. No
acute air-fluid levels. Mastoid air cells are clear.

Other: No significant changes since the previous study.
IMPRESSION: No acute intracranial abnormalities.

## 2022-05-14 ENCOUNTER — Other Ambulatory Visit: Payer: Self-pay

## 2022-05-20 ENCOUNTER — Other Ambulatory Visit: Payer: Self-pay | Admitting: Gerontology

## 2022-05-20 ENCOUNTER — Other Ambulatory Visit: Payer: Self-pay

## 2022-05-20 DIAGNOSIS — F411 Generalized anxiety disorder: Secondary | ICD-10-CM

## 2022-05-20 MED ORDER — HYDROXYZINE HCL 25 MG PO TABS
25.0000 mg | ORAL_TABLET | Freq: Three times a day (TID) | ORAL | 0 refills | Status: DC | PRN
  Filled 2022-05-20: qty 90, 30d supply, fill #0

## 2022-06-02 ENCOUNTER — Ambulatory Visit: Payer: Medicaid Other | Admitting: Gerontology

## 2022-06-02 ENCOUNTER — Other Ambulatory Visit: Payer: Self-pay

## 2022-06-02 ENCOUNTER — Encounter: Payer: Self-pay | Admitting: Gerontology

## 2022-06-02 VITALS — BP 144/91 | HR 90 | Temp 97.4°F | Resp 17 | Ht 72.0 in | Wt 189.7 lb

## 2022-06-02 DIAGNOSIS — I10 Essential (primary) hypertension: Secondary | ICD-10-CM

## 2022-06-02 DIAGNOSIS — F411 Generalized anxiety disorder: Secondary | ICD-10-CM

## 2022-06-02 MED ORDER — LISINOPRIL 40 MG PO TABS
ORAL_TABLET | Freq: Every day | ORAL | 0 refills | Status: DC
  Filled 2022-06-02: qty 90, 90d supply, fill #0

## 2022-06-02 MED ORDER — HYDROXYZINE HCL 25 MG PO TABS
25.0000 mg | ORAL_TABLET | Freq: Three times a day (TID) | ORAL | 0 refills | Status: DC | PRN
  Filled 2022-06-02: qty 90, 30d supply, fill #0
  Filled 2022-06-29: qty 89, 30d supply, fill #0

## 2022-06-02 NOTE — Progress Notes (Signed)
Established Patient Office Visit  Subjective   Patient ID: Miguel [name], male    DOB: Oct 05, 1961  Age: 61 y.o. MRN: [id number]  Chief Complaint  Patient presents with   Follow-up    HPI  Miguel [name] is a 61 y/o male who has history of Alcohol abuse, Anxiety, Asthma, GERD, Gout, Hypertension, Renal calculi presents for routine follow up visit .He states that he's compliant with his medications, denies side effects and continues to make healthy lifestyle changes.  He states that his mood is good, denies suicidal no homicidal ideation.  He also states that he has not had any alcoholic beverage since November 2022.  Overall, he states that he is doing well and offers no further complaint.  Review of Systems  Constitutional: Negative.   Respiratory: Negative.    Cardiovascular: Negative.   Neurological: Negative.   Psychiatric/Behavioral: Negative.        Objective:     BP (!) 144/91 (BP Location: Right Arm, Patient Position: Sitting, Cuff Size: Large)   Pulse 90   Temp (!) 97.4 F (36.3 C) (Oral)   Resp 17   Ht 6' (1.829 m)   Wt 189 lb 11.2 oz (86 kg)   SpO2 98%   BMI 25.73 kg/m  BP Readings from Last 3 Encounters:  06/02/22 (!) 144/91  04/02/22 124/77  02/17/22 112/78   Wt Readings from Last 3 Encounters:  06/02/22 189 lb 11.2 oz (86 kg)  04/02/22 186 lb 9.6 oz (84.6 kg)  02/17/22 187 lb 14.4 oz (85.2 kg)      Physical Exam HENT:     Head: Normocephalic and atraumatic.     Mouth/Throat:     Mouth: Mucous membranes are moist.  Eyes:     Extraocular Movements: Extraocular movements intact.     Conjunctiva/sclera: Conjunctivae normal.     Pupils: Pupils are equal, round, and reactive to light.  Cardiovascular:     Rate and Rhythm: Normal rate and regular rhythm.     Pulses: Normal pulses.     Heart sounds: Normal heart sounds.  Pulmonary:     Effort: Pulmonary effort is normal.     Breath sounds: Normal breath sounds.  Skin:    General: Skin is warm.   Neurological:     General: No focal deficit present.     Mental Status: He is alert and oriented to person, place, and time. Mental status is at baseline.  Psychiatric:        Mood and Affect: Mood normal.        Behavior: Behavior normal.        Thought Content: Thought content normal.        Judgment: Judgment normal.      No results found for any visits on 06/02/22.  Last CBC Lab Results  Component Value Date   WBC 8.2 11/21/2021   HGB 13.8 11/21/2021   HCT 42.9 11/21/2021   MCV 85.3 11/21/2021   MCH 27.4 11/21/2021   RDW 16.8 (H) 11/21/2021   PLT 230 24/26/8341   Last metabolic panel Lab Results  Component Value Date   GLUCOSE 99 11/21/2021   NA 136 11/21/2021   K 4.1 11/21/2021   CL 103 11/21/2021   CO2 26 11/21/2021   BUN 12 11/21/2021   CREATININE 1.17 11/21/2021   GFRNONAA >60 11/21/2021   CALCIUM 9.4 11/21/2021   PHOS 2.7 11/29/2020   PROT 7.6 11/21/2021   ALBUMIN 4.5 11/21/2021   LABGLOB 2.6 05/22/2020  AGRATIO 1.7 05/22/2020   BILITOT 1.6 (H) 11/21/2021   ALKPHOS 67 11/21/2021   AST 20 11/21/2021   ALT 24 11/21/2021   ANIONGAP 7 11/21/2021   Last lipids Lab Results  Component Value Date   CHOL 155 05/22/2020   HDL 65 05/22/2020   LDLCALC 73 05/22/2020   TRIG 92 05/22/2020   CHOLHDL 2.4 05/22/2020   Last hemoglobin A1c Lab Results  Component Value Date   HGBA1C 5.2 11/29/2020      The 10-year ASCVD risk score (Arnett DK, et al., 2019) is: 9.9%    Assessment & Plan:   1. Generalized anxiety disorder -His anxiety is under control with taking hydroxyzine, he will continue on current medication and was advised to call the crisis helpline for worsening symptoms the clinic. - hydrOXYzine (ATARAX) 25 MG tablet; Take 1 tablet (25 mg total) by mouth 3 (three) times daily as needed for anxiety.  Dispense: 90 tablet; Refill: 0  2. Essential hypertension -His blood pressure is a little elevated at 144/91 and his goal should be less than  140/90.  He will continue current medication, DASH diet and exercise as tolerated. - lisinopril (ZESTRIL) 40 MG tablet; TAKE ONE TABLET BY MOUTH ONCE EVERY DAY.  Dispense: 90 tablet; Refill: 0   Return in about 9 weeks (around 08/04/2022), or if symptoms worsen or fail to improve.    Christophe Rising Jerold Coombe, NP

## 2022-06-02 NOTE — Patient Instructions (Signed)

## 2022-06-06 IMAGING — US US ABDOMEN LIMITED
1 series · 14 of 25 positions shown · non-contrast
Comparison: 08/08/2021 CT

CLINICAL DATA: Right upper quadrant pain for 1 day

EXAM:
ULTRASOUND ABDOMEN LIMITED RIGHT UPPER QUADRANT

[Series 1: us abdomen limited ruq (liver/gb) · 14 of 25 slices shown]
[im 1/25]
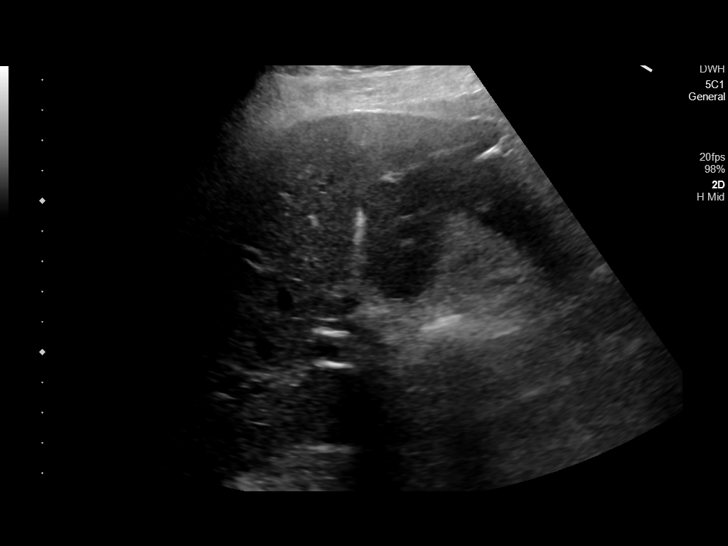
[im 3/25]
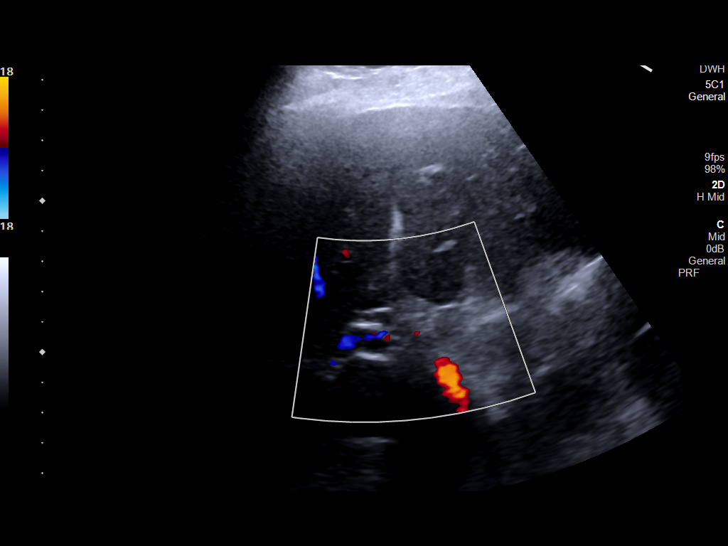
[im 5/25]
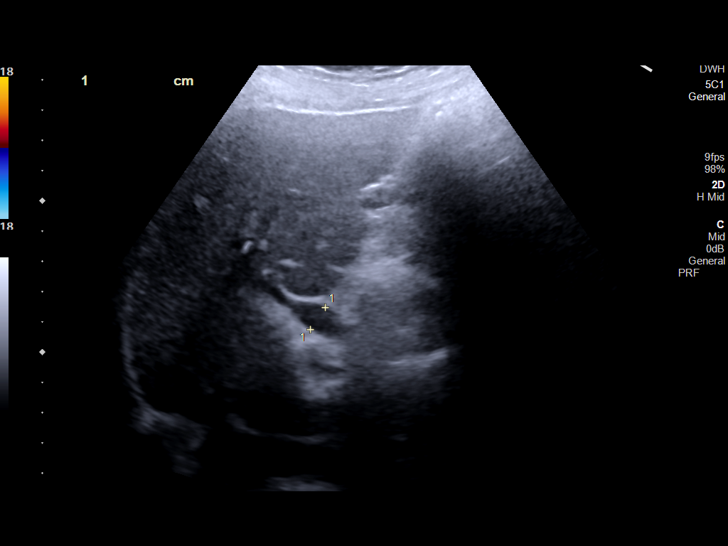
[im 7/25]
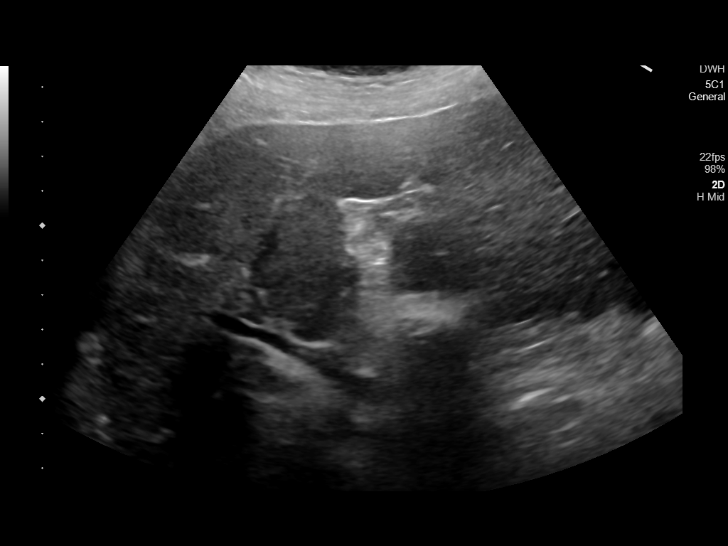
[im 9/25]
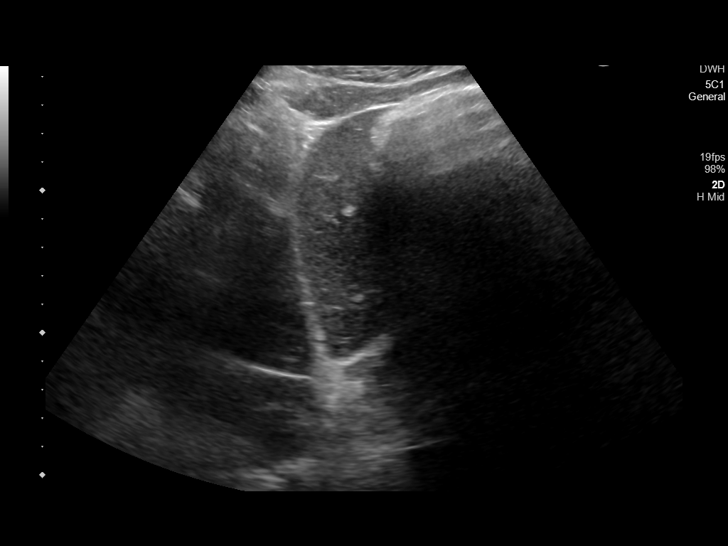
[im 10/25]
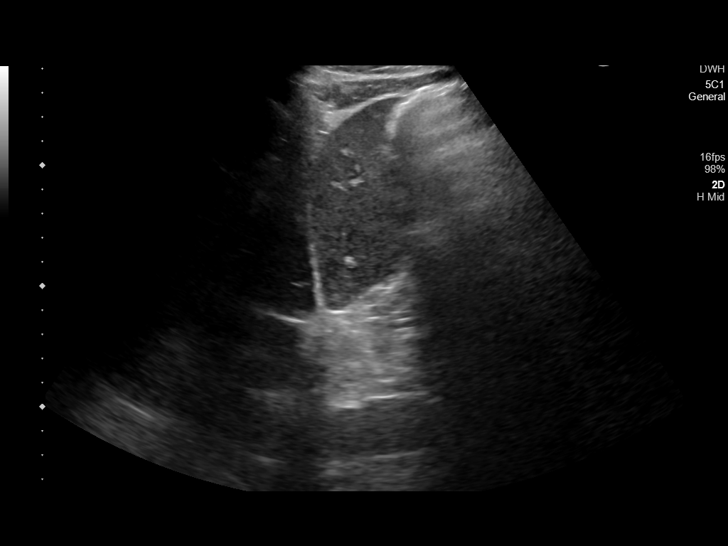
[im 12/25]
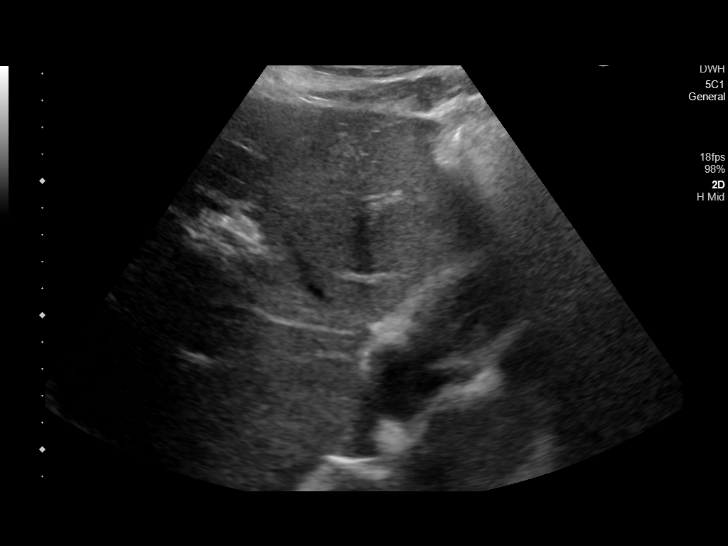
[im 14/25]
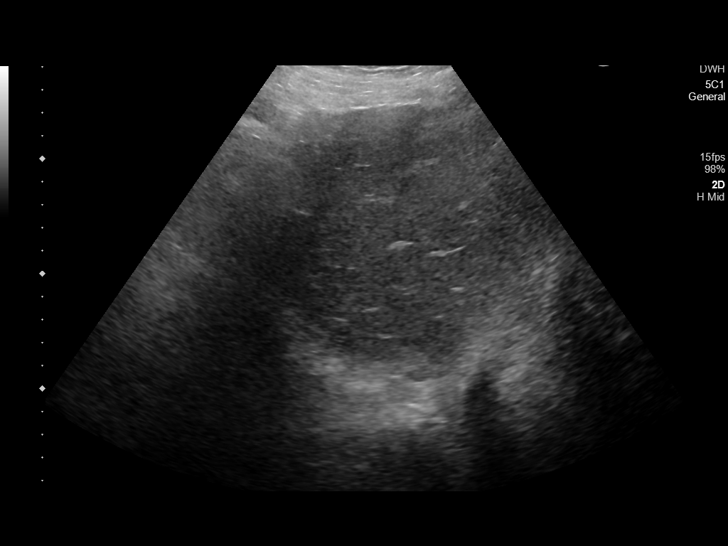
[im 16/25]
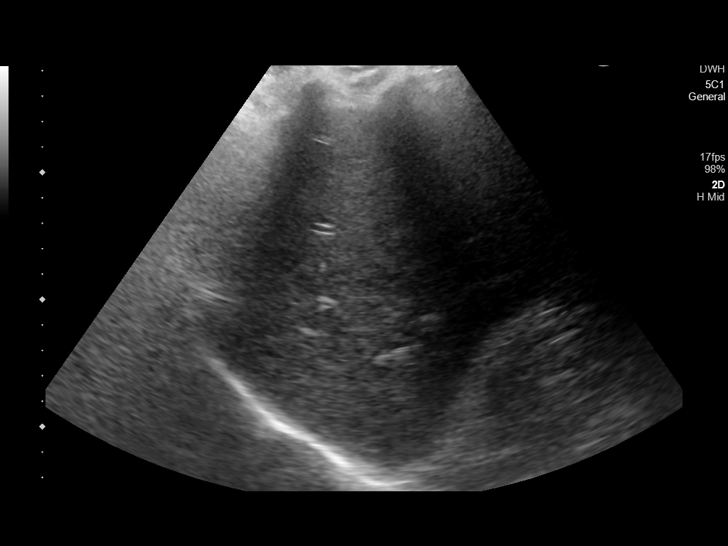
[im 17/25]
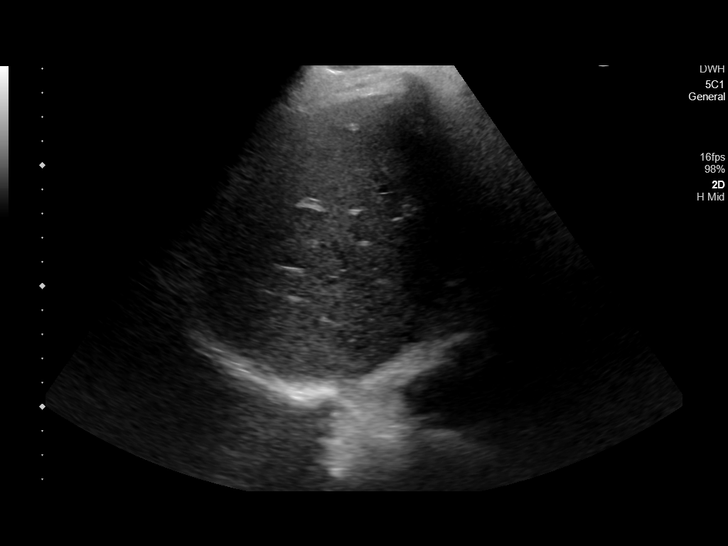
[im 19/25]
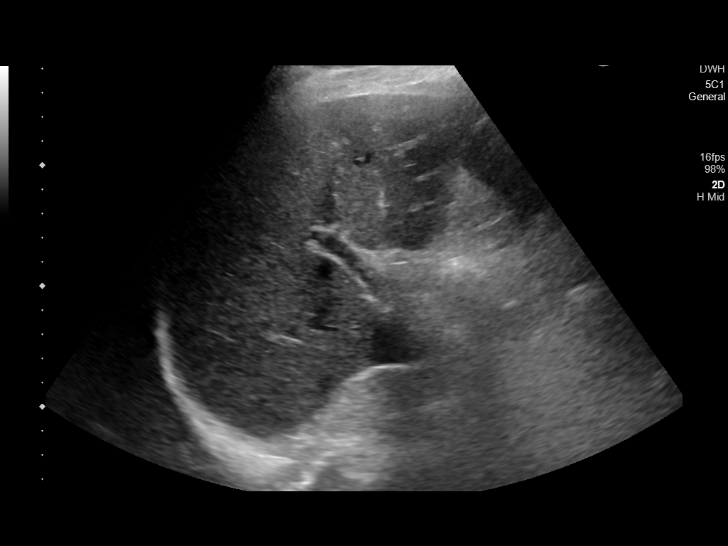
[im 21/25]
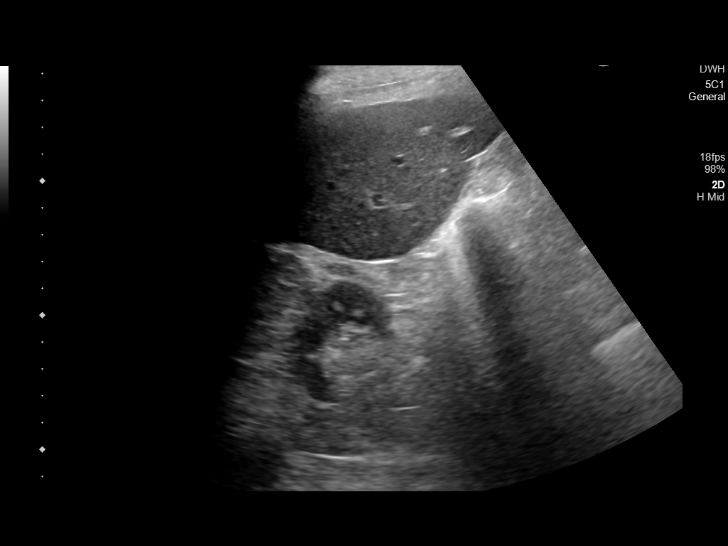
[im 23/25]
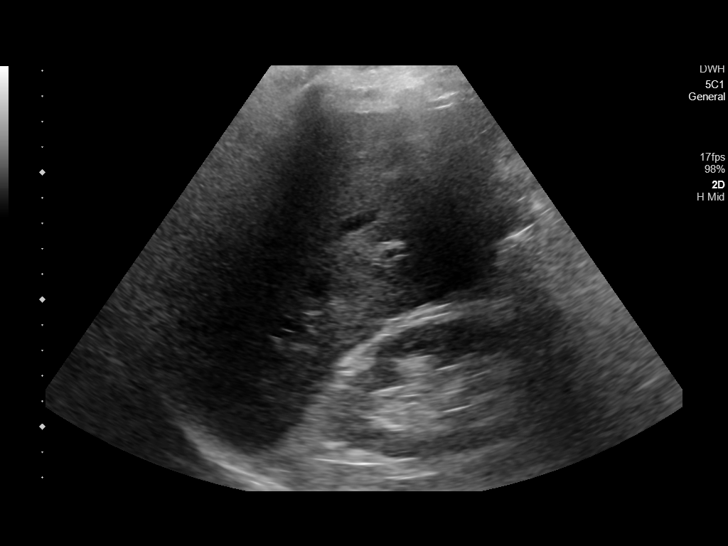
[im 25/25]
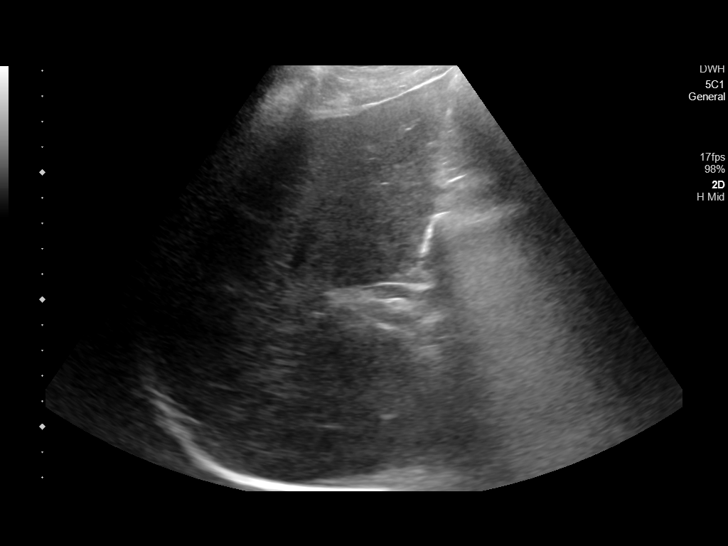

[14 of 25 positions shown; findings below may reference images not displayed]

FINDINGS: Gallbladder:

Surgically removed.

Common bile duct:

Diameter: 8.8 mm consistent with the post cholecystectomy state.

Liver:

No focal lesion identified. Within normal limits in parenchymal
echogenicity. Portal vein is patent on color Doppler imaging with
normal direction of blood flow towards the liver.

Other: None.
IMPRESSION: Status post cholecystectomy.  No acute abnormality noted.

## 2022-06-06 IMAGING — CT CT ABD-PELV W/ CM
2 of 5 series · 16 of 46 positions shown, 18 images · IV contrast (APPLIED)
Comparison: Ultrasound from earlier in the same day.

CLINICAL DATA: Right upper quadrant pain with nausea and vomiting,
initial encounter

EXAM:
CT ABDOMEN AND PELVIS WITH CONTRAST
TECHNIQUE: Multidetector CT imaging of the abdomen and pelvis was performed
using the standard protocol following bolus administration of
intravenous contrast.
CONTRAST:  100mL OMNIPAQUE IOHEXOL 350 MG/ML SOLN

[Series 2: routine abd/pel with · axial · 0.90mm/px · z∈[-896,-466]mm · 13 of 98 slices shown, 15 images]
[im 6/98  soft-tissue]
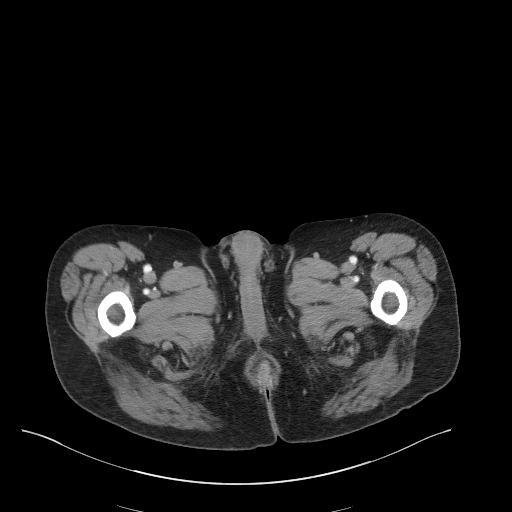
[im 6/98  bone]
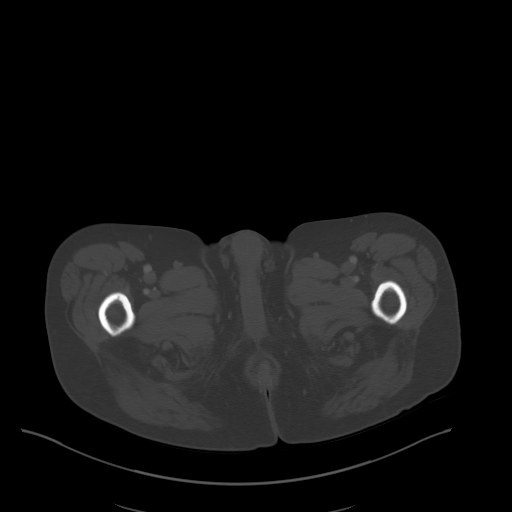
[im 11/98  soft-tissue]
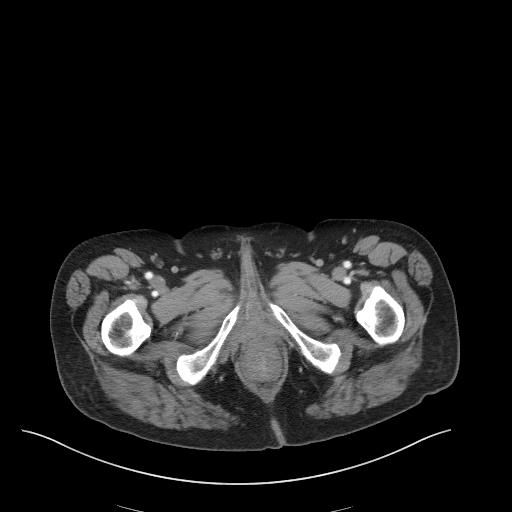
[im 22/98  soft-tissue]
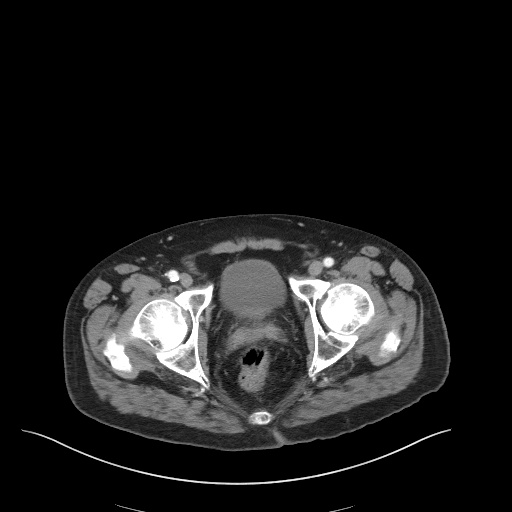
[im 27/98  soft-tissue]
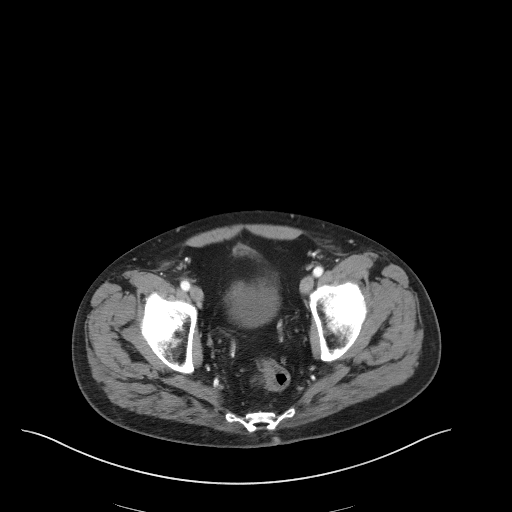
[im 33/98  soft-tissue]
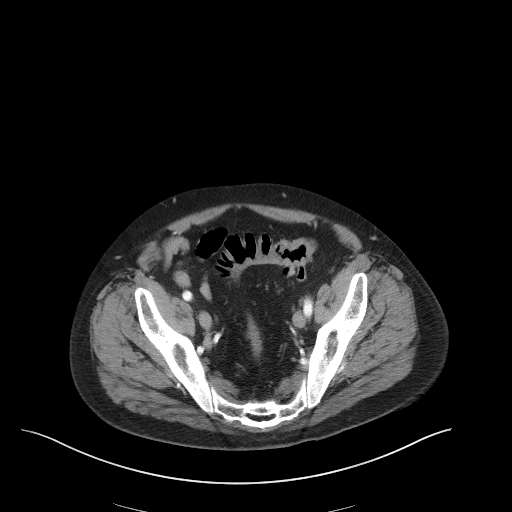
[im 44/98  soft-tissue]
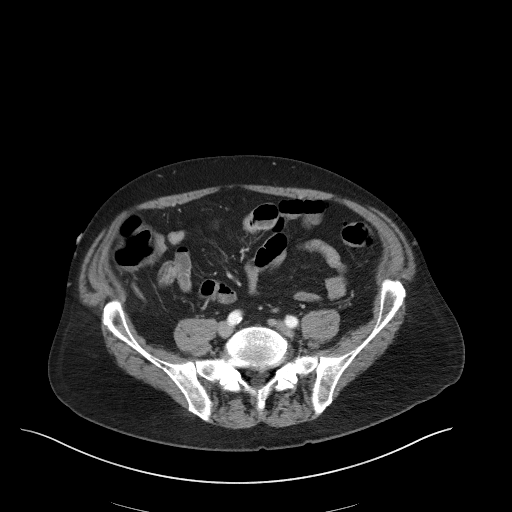
[im 49/98  soft-tissue]
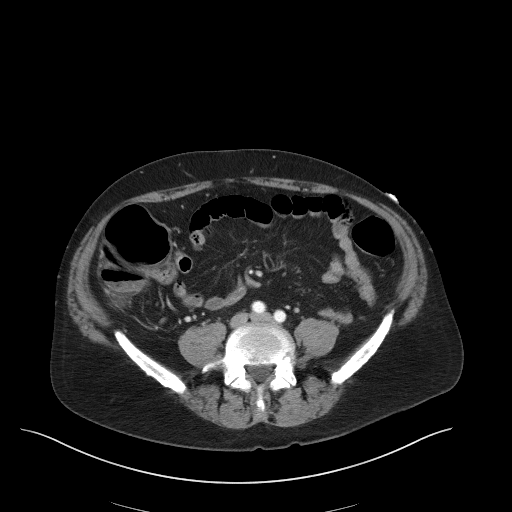
[im 54/98  soft-tissue]
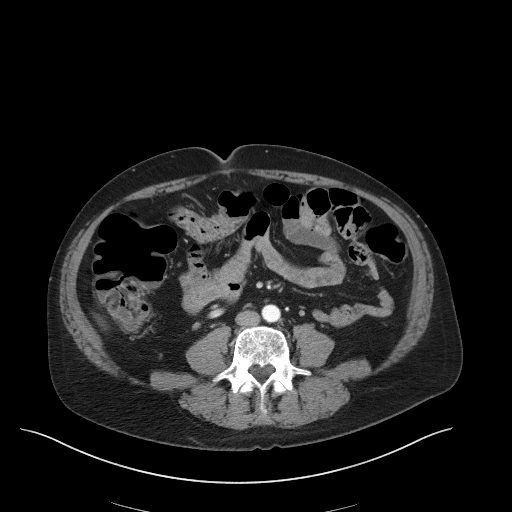
[im 65/98  soft-tissue]
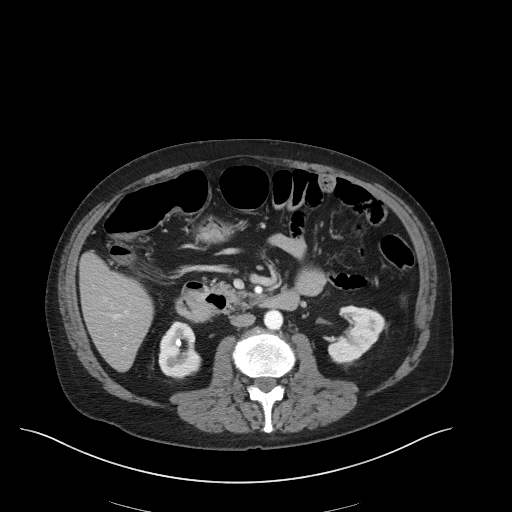
[im 65/98  bone]
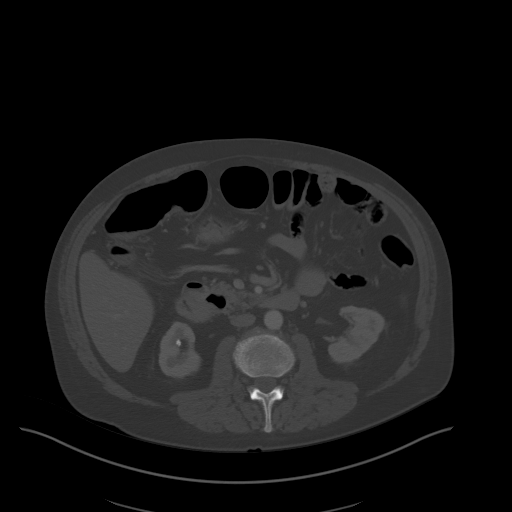
[im 71/98  soft-tissue]
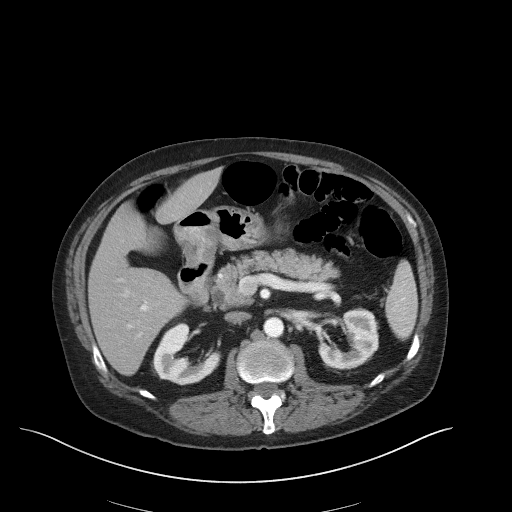
[im 76/98  soft-tissue]
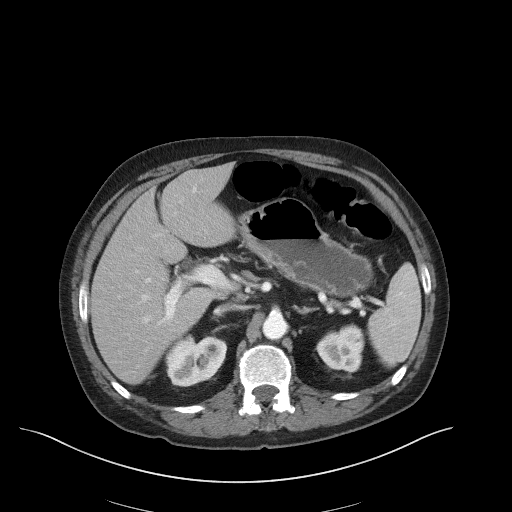
[im 87/98  soft-tissue]
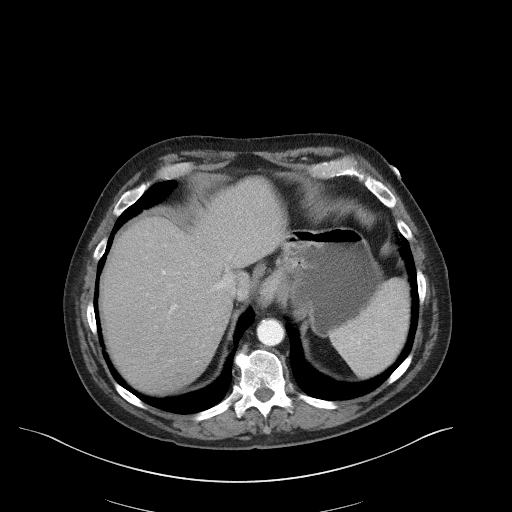
[im 92/98  soft-tissue]
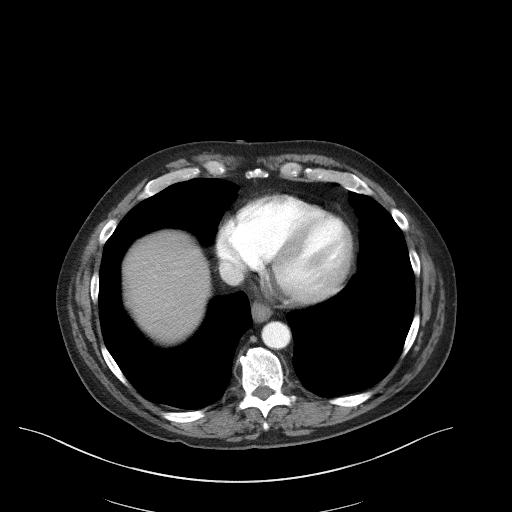

[Series 5: coronal st · coronal · 0.76mm/px · 3 of 98 slices shown]
[im 33/98  soft-tissue]
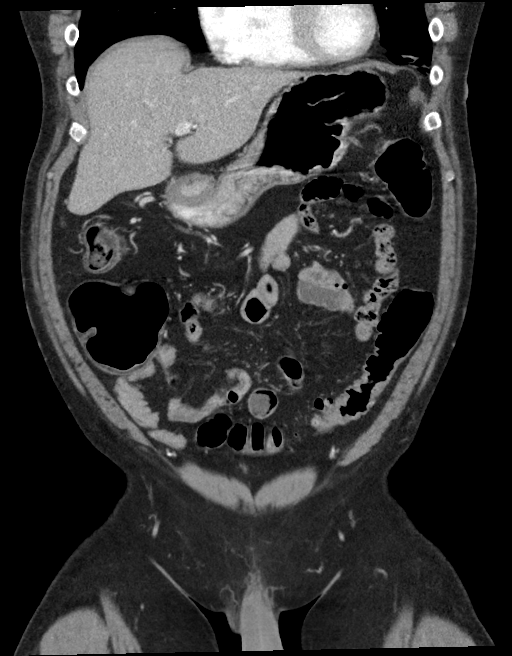
[im 44/98  soft-tissue]
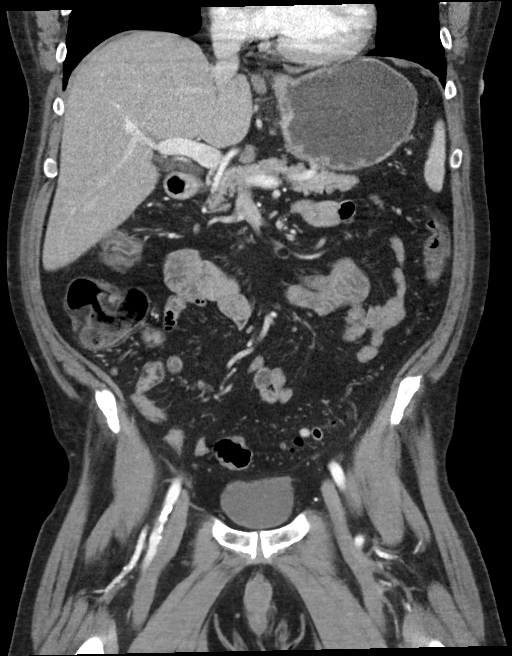
[im 54/98  soft-tissue]
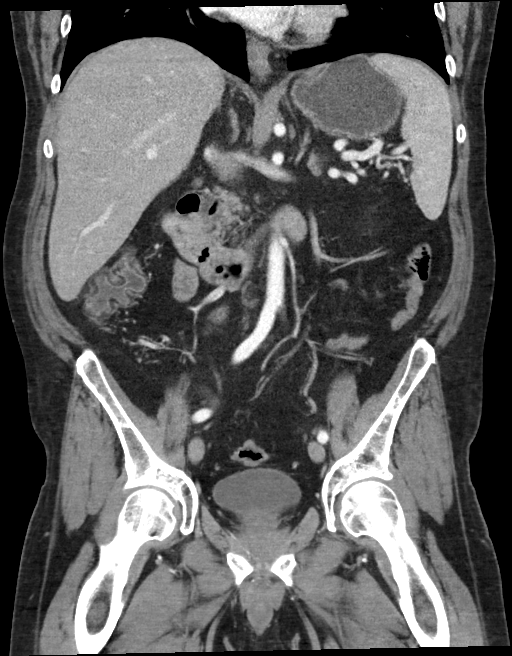

[16 of 46 positions shown; findings below may reference images not displayed]

FINDINGS: Lower chest: No acute abnormality.

Hepatobiliary: Fatty infiltration of the liver is noted. The
gallbladder has been surgically removed.

Pancreas: Unremarkable. No pancreatic ductal dilatation or
surrounding inflammatory changes.

Spleen: Normal in size without focal abnormality.

Adrenals/Urinary Tract: Adrenal glands are within normal limits.
Kidneys demonstrate multiple bilateral nonobstructing renal calculi.
The largest of these on the right is in the lower pole measuring 6
mm. Largest on the left is noted in the upper pole measuring 4 mm.
Ureters appear within normal limits. No obstructive changes are
noted. The bladder is partially distended.

Stomach/Bowel: Scattered diverticular change of the colon is noted.
Mild smooth muscle thickening is seen. No obstructive changes in the
colon are seen. The appendix is within normal limits. Small bowel
and stomach appear within normal limits with the exception of a
duodenal diverticulum adjacent to the head of the pancreas.

Vascular/Lymphatic: Aortic atherosclerosis. No enlarged abdominal or
pelvic lymph nodes.

Reproductive: Prostate is unremarkable.

Other: No abdominal wall hernia or abnormality. No abdominopelvic
ascites.

Musculoskeletal: Mild degenerative changes of lumbar spine are seen.
No acute bony abnormality is noted.
IMPRESSION: Fatty liver.

Multiple bilateral renal calculi relatively stable from the prior
exam. No obstructive changes are seen.

Diverticulosis without diverticulitis.

## 2022-06-08 ENCOUNTER — Other Ambulatory Visit: Payer: Self-pay

## 2022-06-08 MED FILL — Albuterol Sulfate Inhal Aero 108 MCG/ACT (90MCG Base Equiv): RESPIRATORY_TRACT | 60 days supply | Qty: 17 | Fill #0 | Status: AC

## 2022-06-18 ENCOUNTER — Other Ambulatory Visit: Payer: Self-pay

## 2022-06-19 ENCOUNTER — Other Ambulatory Visit: Payer: Self-pay

## 2022-06-29 ENCOUNTER — Other Ambulatory Visit: Payer: Self-pay

## 2022-06-30 ENCOUNTER — Other Ambulatory Visit: Payer: Self-pay

## 2022-07-12 IMAGING — CR DG CHEST 2V
1 series · 2 of 2 positions shown · non-contrast
Comparison: Chest radiograph dated 08/01/2021.

CLINICAL DATA: Chest wall pain.

EXAM:
CHEST - 2 VIEW

[Series 1: dg chest 2 view · 0.14mm/px · 2 of 2 slices shown]
[im 1/2]
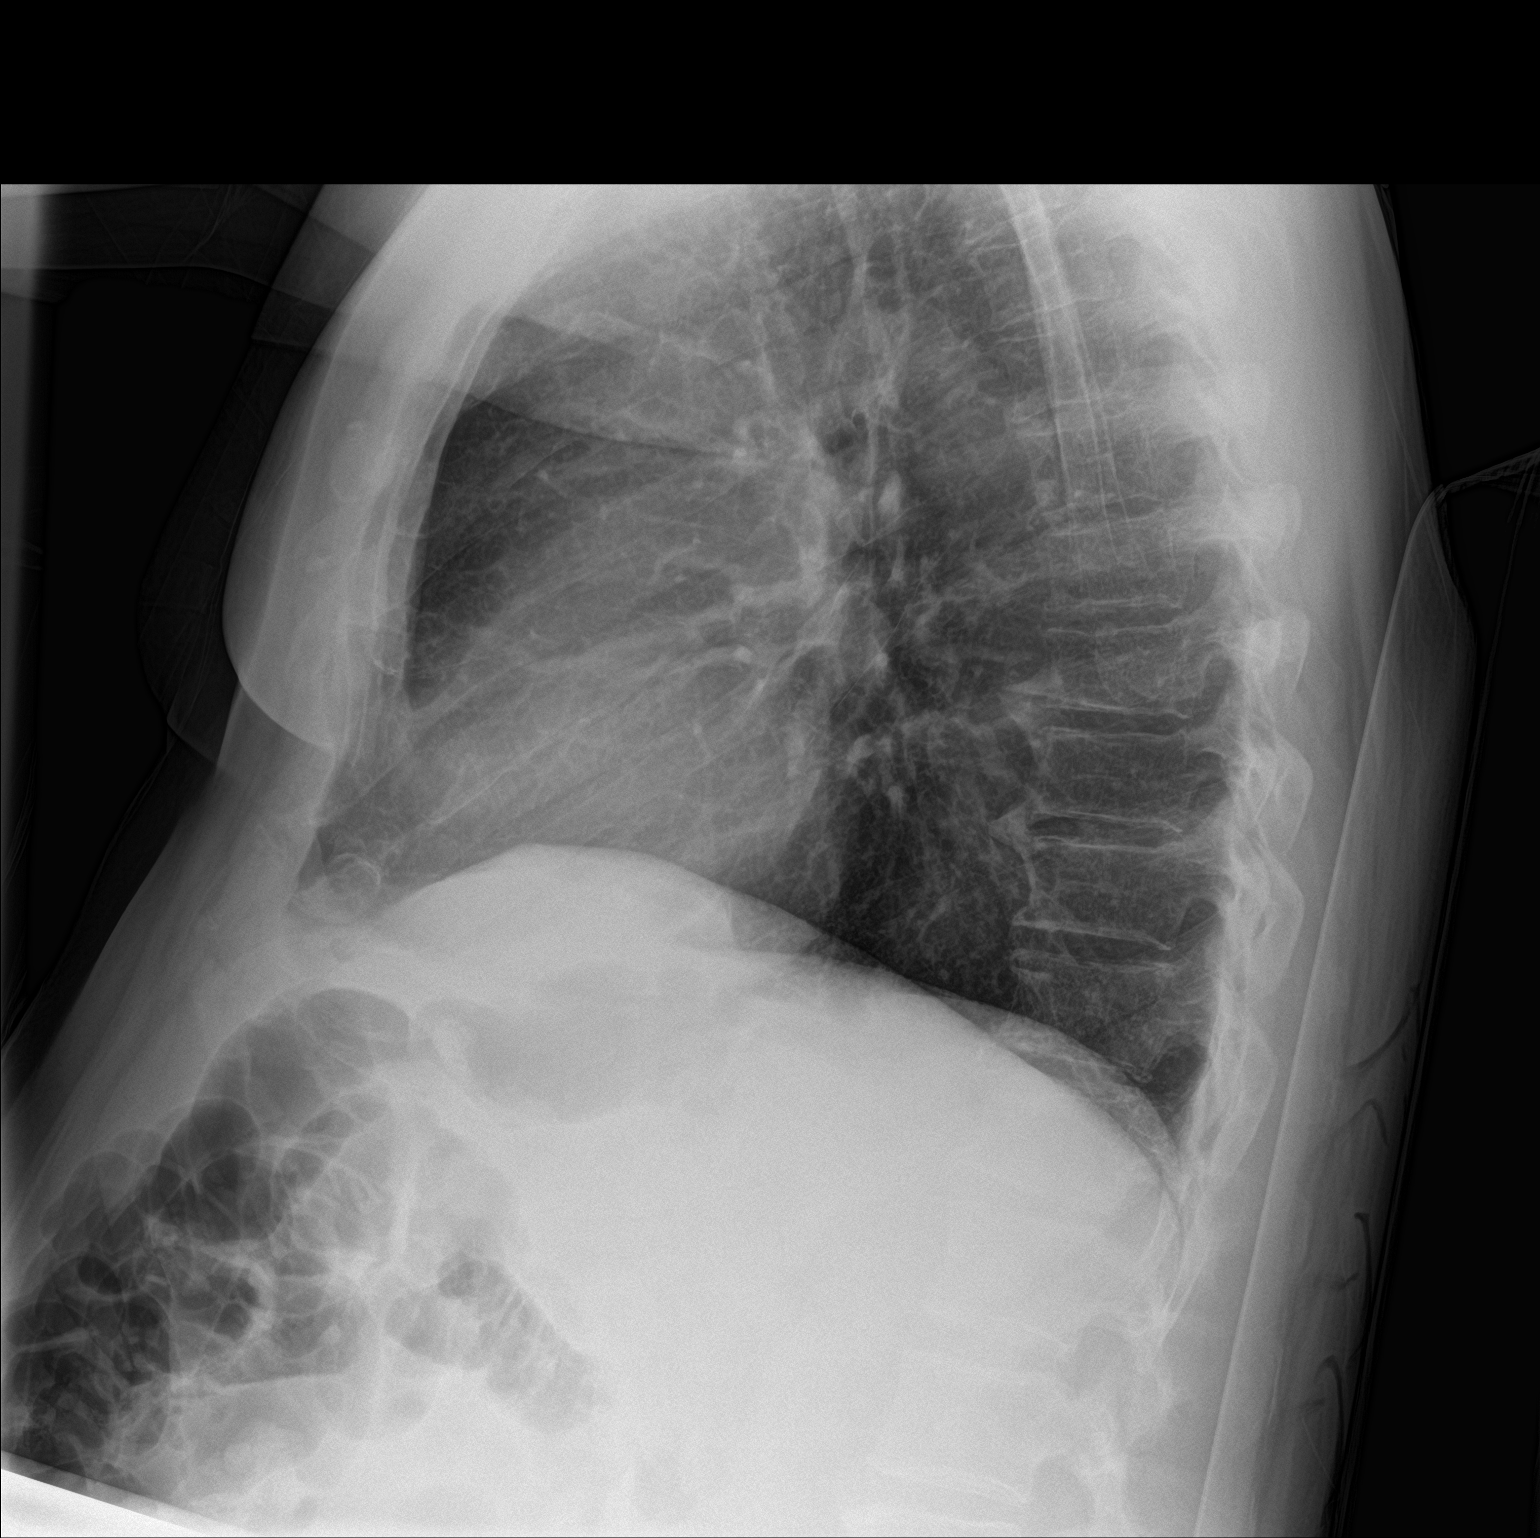
[im 2/2]
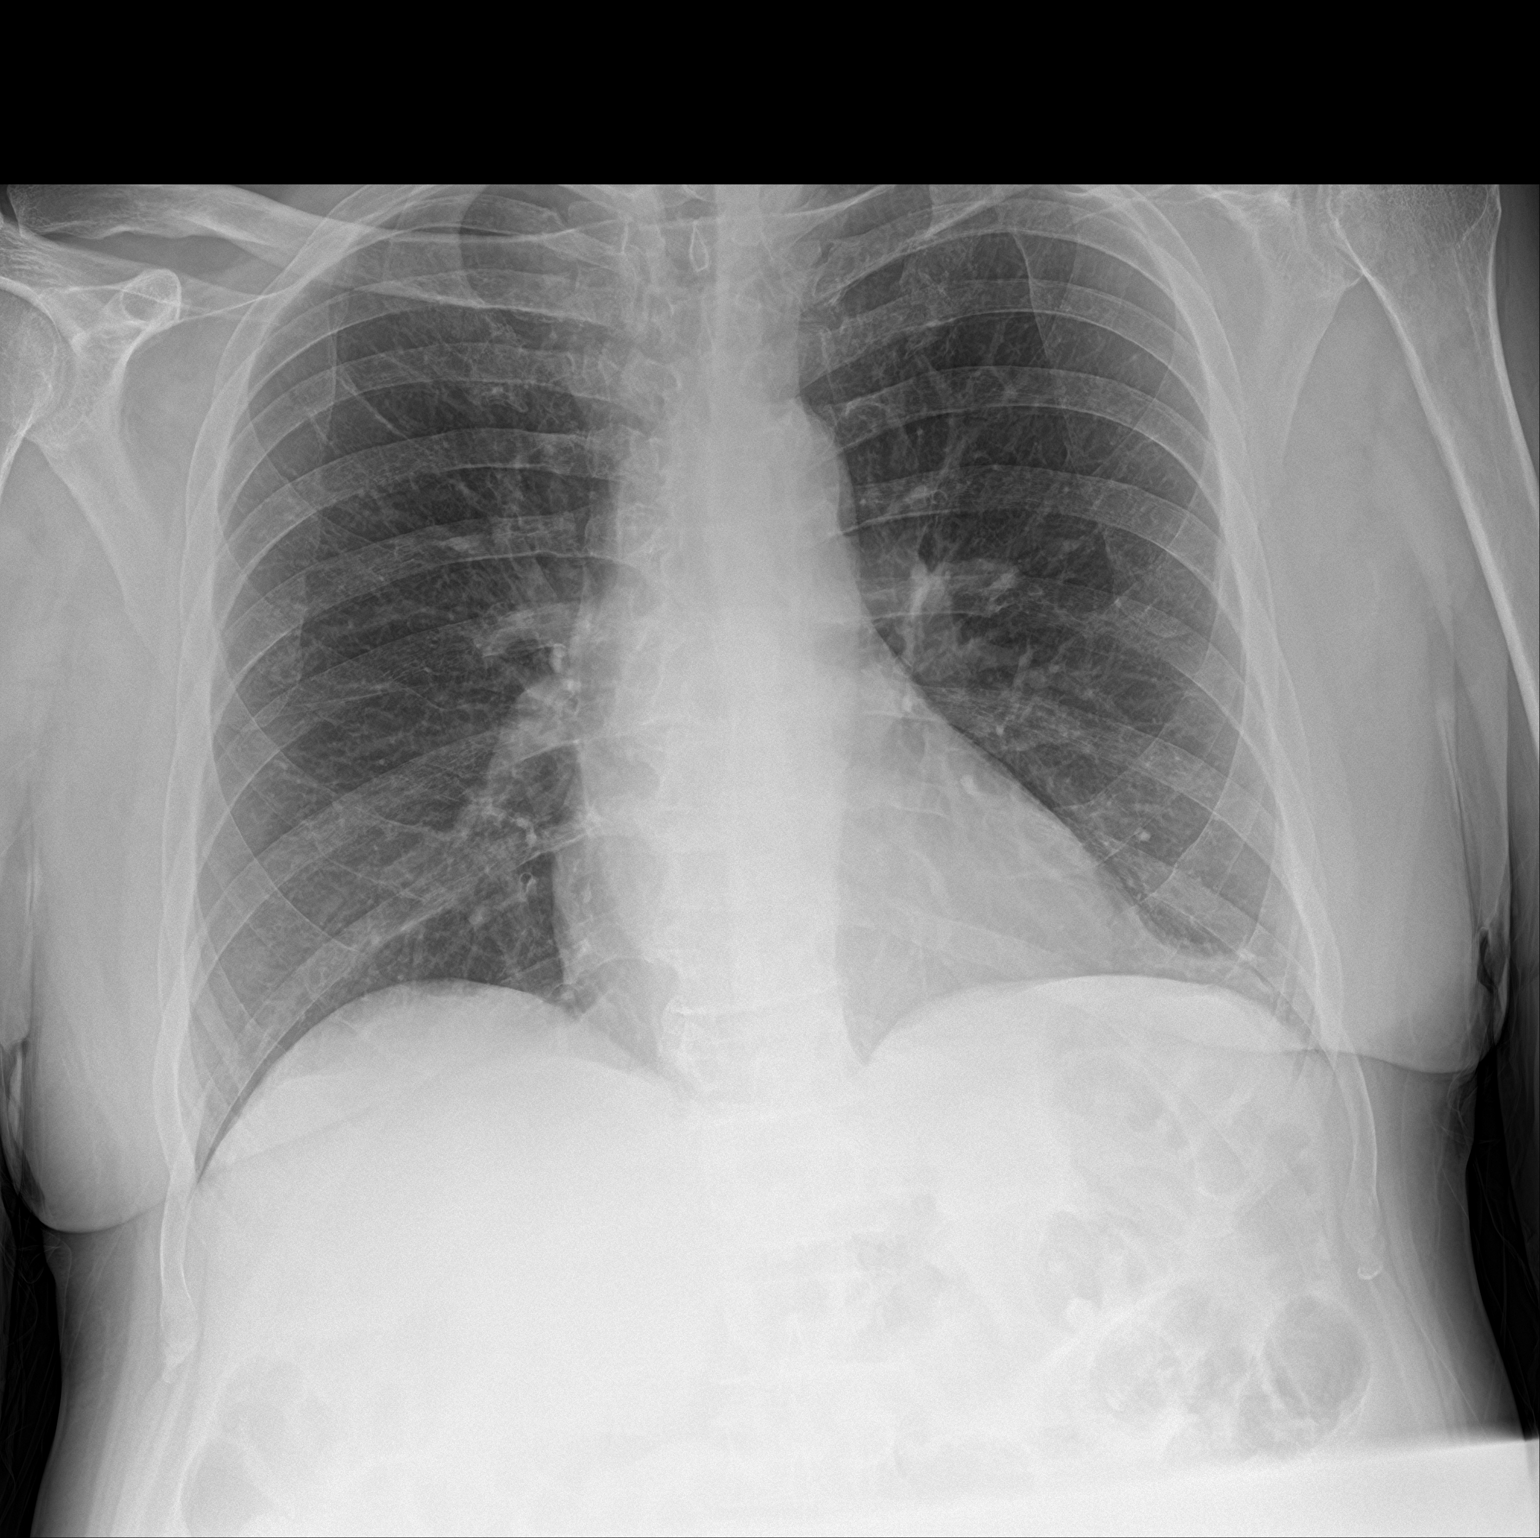

[2 of 2 positions shown; findings below may reference images not displayed]

FINDINGS: No focal consolidation pleural effusion or pneumothorax. The cardiac
silhouette is within limits. Degenerative changes of the spine. No
acute osseous pathology.
IMPRESSION: No active cardiopulmonary disease.

## 2022-07-13 IMAGING — CT CT ABD-PELV W/O CM
2 of 4 series · 16 of 46 positions shown, 18 images · non-contrast
Comparison: 09/05/2021

CLINICAL DATA: Right flank pain.

EXAM:
CT ABDOMEN AND PELVIS WITHOUT CONTRAST
TECHNIQUE: Multidetector CT imaging of the abdomen and pelvis was performed
following the standard protocol without IV contrast.

[Series 2: routine abd/pel wo · axial · 0.93mm/px · z∈[-539,-99]mm · 13 of 96 slices shown, 15 images]
[im 4/96  soft-tissue]
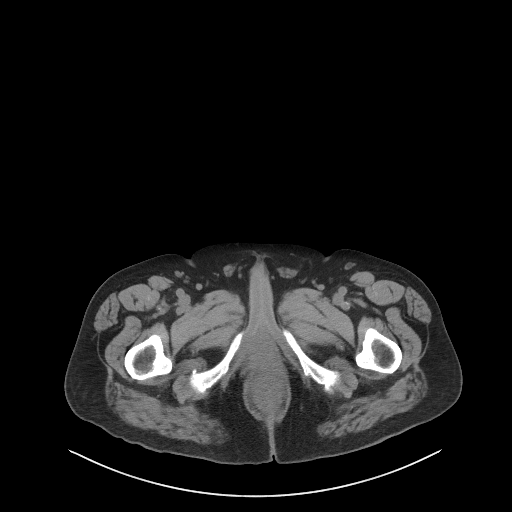
[im 4/96  bone]
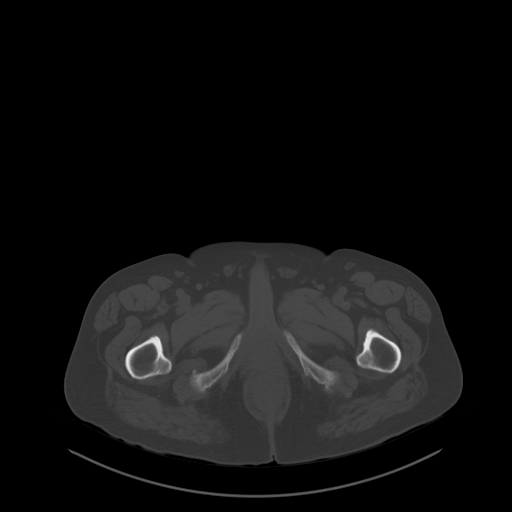
[im 12/96  soft-tissue]
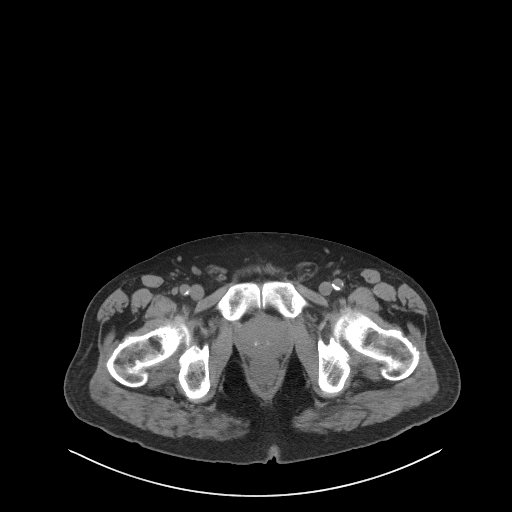
[im 20/96  soft-tissue]
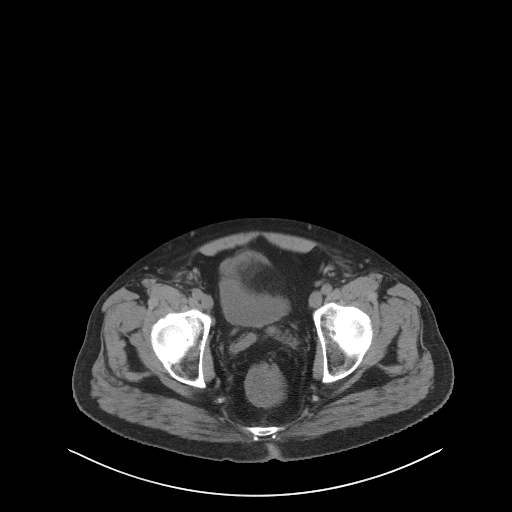
[im 27/96  soft-tissue]
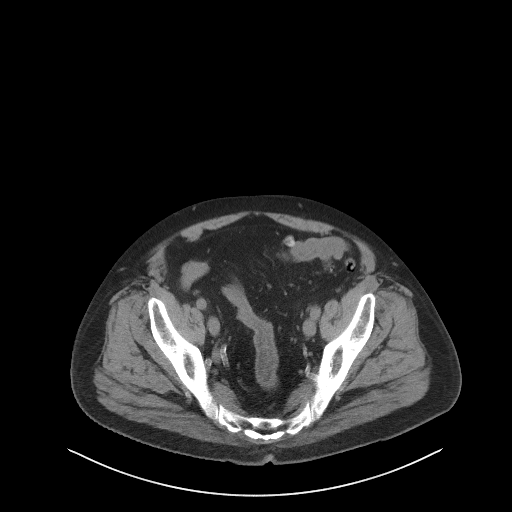
[im 35/96  soft-tissue]
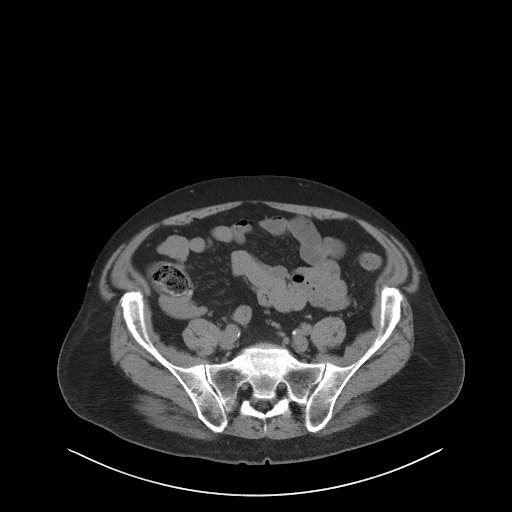
[im 42/96  soft-tissue]
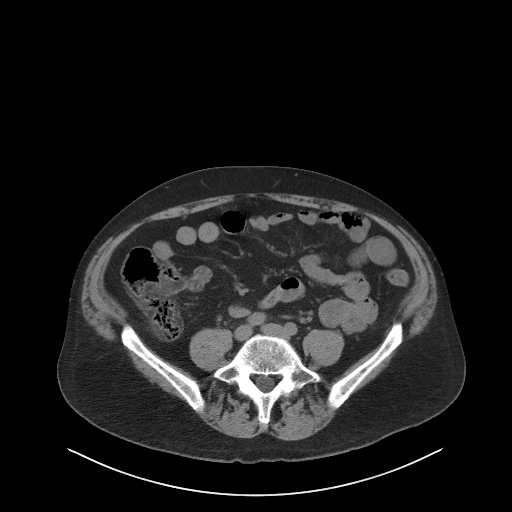
[im 50/96  soft-tissue]
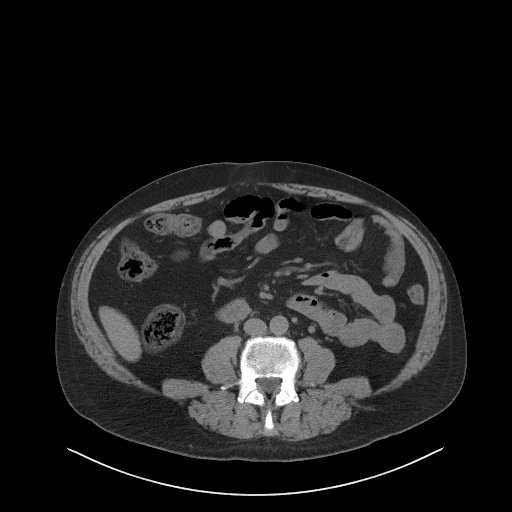
[im 54/96  soft-tissue]
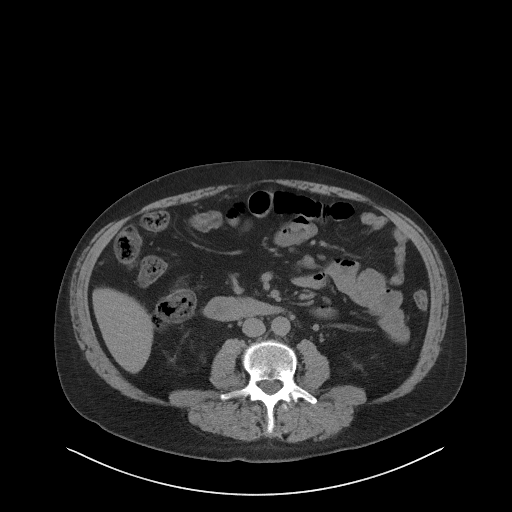
[im 61/96  soft-tissue]
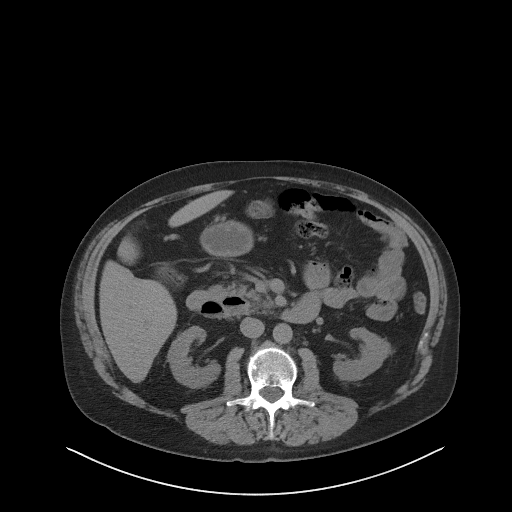
[im 61/96  bone]
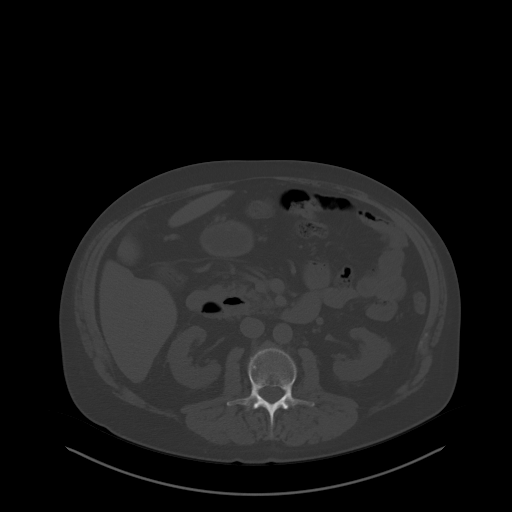
[im 69/96  soft-tissue]
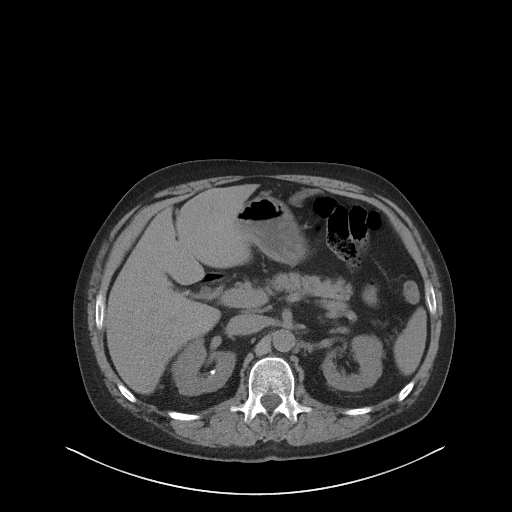
[im 77/96  soft-tissue]
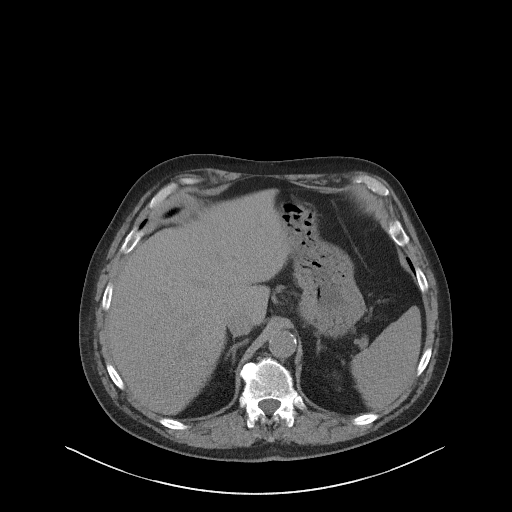
[im 84/96  soft-tissue]
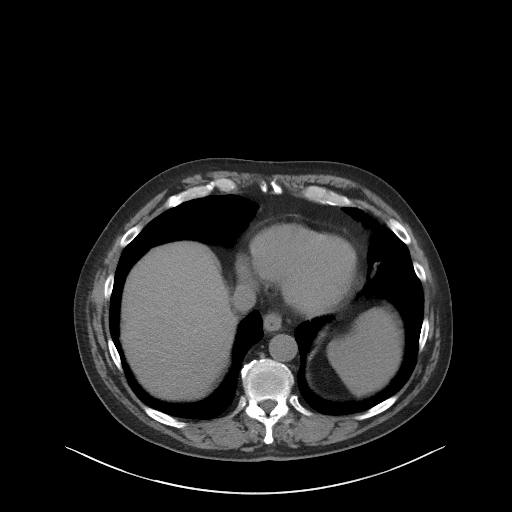
[im 92/96  soft-tissue]
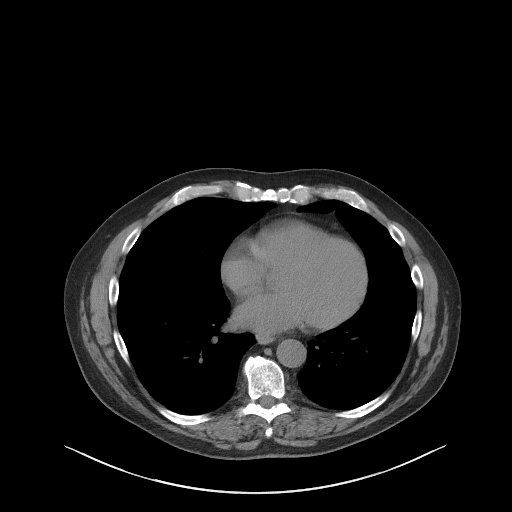

[Series 5: coronal st · coronal · 0.76mm/px · 3 of 98 slices shown]
[im 33/98  soft-tissue]
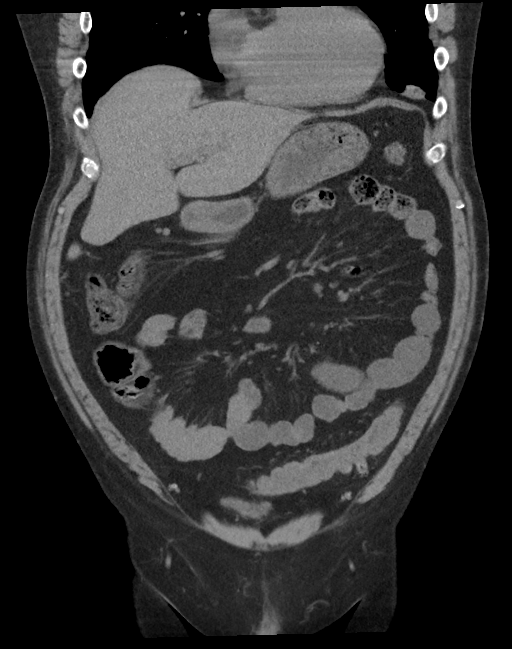
[im 44/98  soft-tissue]
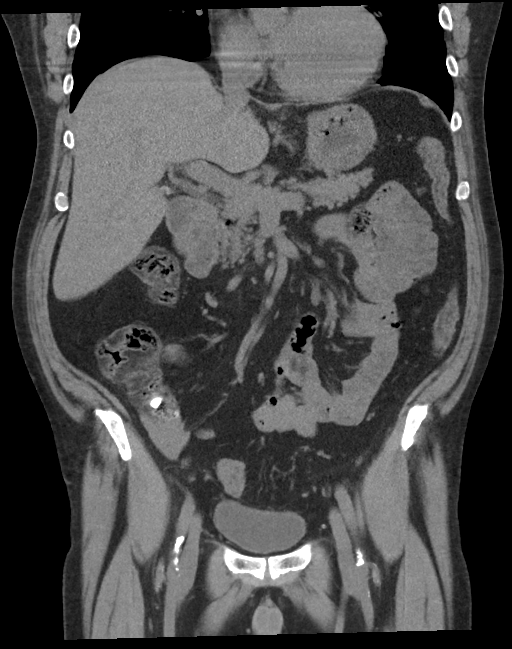
[im 54/98  soft-tissue]
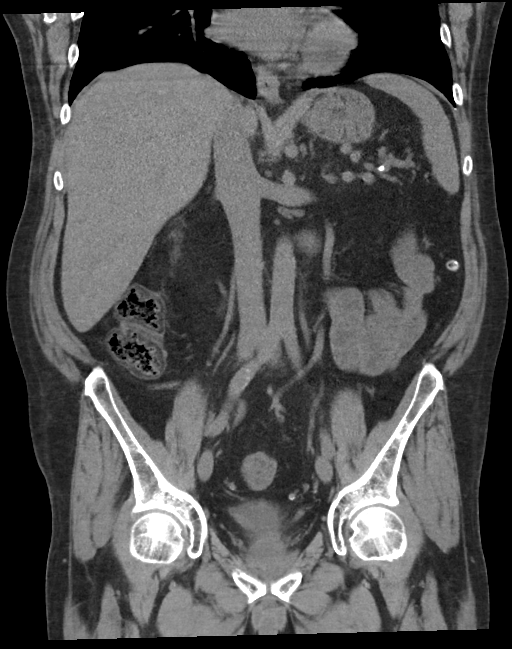

[16 of 46 positions shown; findings below may reference images not displayed]

FINDINGS: Lower chest: No acute findings.

Hepatobiliary: No mass visualized on this unenhanced exam. Prior
cholecystectomy. No evidence of biliary obstruction.

Pancreas: No mass or inflammatory process visualized on this
unenhanced exam.

Spleen:  Within normal limits in size.

Adrenals/Urinary tract: Multiple small renal calculi are again seen
bilaterally measuring up to 5 mm. No evidence of ureteral calculi or
hydronephrosis. Unremarkable unopacified urinary bladder.

Stomach/Bowel: No evidence of obstruction, inflammatory process, or
abnormal fluid collections. Small duodenal diverticulum again noted,
without inflammatory changes in this region. Normal appendix
visualized. Diverticulosis is seen mainly involving the sigmoid
colon, however there is no evidence of diverticulitis.

Vascular/Lymphatic: No pathologically enlarged lymph nodes
identified. No evidence of abdominal aortic aneurysm. Aortic
atherosclerotic calcification noted.

Reproductive:  No mass or other significant abnormality.

Other: A small paraumbilical ventral hernia is again seen, which
contains only fat.

Musculoskeletal:  No suspicious bone lesions identified.
IMPRESSION: Bilateral nephrolithiasis. No evidence of ureteral calculi,
hydronephrosis, or other acute findings.

Colonic diverticulosis, without radiographic evidence of
diverticulitis.

Stable small paraumbilical ventral hernia containing only fat.

Aortic Atherosclerosis ([id number].V).

## 2022-07-15 ENCOUNTER — Ambulatory Visit: Payer: Medicaid Other | Admitting: Licensed Clinical Social Worker

## 2022-07-16 ENCOUNTER — Ambulatory Visit: Payer: Self-pay | Admitting: Licensed Clinical Social Worker

## 2022-07-23 ENCOUNTER — Ambulatory Visit: Payer: Self-pay | Admitting: Licensed Clinical Social Worker

## 2022-07-23 DIAGNOSIS — F1011 Alcohol abuse, in remission: Secondary | ICD-10-CM

## 2022-07-23 DIAGNOSIS — F411 Generalized anxiety disorder: Secondary | ICD-10-CM

## 2022-07-23 DIAGNOSIS — F339 Major depressive disorder, recurrent, unspecified: Secondary | ICD-10-CM

## 2022-07-23 NOTE — BH Specialist Note (Signed)
Integrated Behavioral Health via Telemedicine Visit  07/23/2022 Miguel [name] [id number]  Number of [place] Clinician visits: No data recorded Session Start time: No data recorded  Session End time: No data recorded Total time in minutes: No data recorded  Referring Provider: Carlyon Shadow, NP Patient/Family location: [facility] Provider location: [facility] All persons participating in visit: Square Jowett and [name], LCSW-A Types of Service: Telephone visit  I connected with [name] via  Telephone or Video Enabled Telemedicine Application  (Video is Caregility application) and verified that I am speaking with the correct person using two identifiers. Discussed confidentiality: Yes   I discussed the limitations of telemedicine and the availability of in person appointments.  Discussed there is a possibility of technology failure and discussed alternative modes of communication if that failure occurs.  Patient and/or legal guardian expressed understanding and consented to Telemedicine visit: Yes   Presenting Concerns: Patient and/or family reports the following symptoms/concerns: The patient reports that he has been doing okay since his last follow-up appointment. He noted that he continues to struggle with feeling on edge and anxious daily. He shared that he focuses on staying positive and finds that most helpful. The patient shared that he visits with his wife regularly, and feels good about bringing her things that she needs. Mr. Geimer discussed financial stressors impacting his life currently He explained that he does not want to share these concerns with his wife because he doesn't want her to worry. The patient stated over all he continues to do okay and can see that some of the healthy choices he is making is helping him. The patient denied any suicidal or homicidal thoughts.  Duration of problem: Years; Severity of  problem: moderate  Patient and/or Family's Strengths/Protective Factors: Concrete supports in place (healthy food, safe environments, etc.) and Sense of purpose  Goals Addressed: Patient will:  Reduce symptoms of: agitation, anxiety, depression, insomnia, and stress   Increase knowledge and/or ability of: coping skills, healthy habits, self-management skills, and stress reduction   Demonstrate ability to: Increase healthy adjustment to current life circumstances  Progress towards Goals: Ongoing  Interventions: Interventions utilized:  CBT Cognitive Behavioral Therapy was utilized by the clinician during today's follow up session. Clinician met with patient to identify needs related to stressors and functioning, and assess and monitor for signs and symptoms of anxiety and depression, and assess safety. The clinician processed with the patient how they have been doing since the last follow-up session. Clinician encouraged the patient to continue to work on calming techniques (eg. paced breathing, deep muscle relaxation, and calming imagery) as a strategy for responding appropriately to anxiety and the urge to avoid situations or self-medicate when they occur and move towards increasing the patients self regulation. Clinician continued to introduce new coping skills such as progressive muscle relaxation and encouraged the patient to practice these even during times when he did not feel distressed. The session ended with scheduling.   Standardized Assessments completed: GAD-7 and PHQ 9 GAD-7= 15 PHQ-9= 11  Assessment: Patient currently experiencing see above.   Patient may benefit from see above.  Plan: Follow up with behavioral health clinician on : Wednesday 09/13.2023 @ 2:00 PM  Behavioral recommendations:  Referral(s): [place] (In Clinic)  I discussed the assessment and treatment plan with the patient and/or parent/guardian. They were provided an  opportunity to ask questions and all were answered. They agreed with the  plan and demonstrated an understanding of the instructions.   They were advised to call back or seek an in-person evaluation if the symptoms worsen or if the condition fails to improve as anticipated.  Lesli Albee, LCSWA

## 2022-07-28 ENCOUNTER — Other Ambulatory Visit: Payer: Self-pay | Admitting: Gerontology

## 2022-07-28 ENCOUNTER — Other Ambulatory Visit: Payer: Self-pay

## 2022-07-28 DIAGNOSIS — F411 Generalized anxiety disorder: Secondary | ICD-10-CM

## 2022-07-29 ENCOUNTER — Other Ambulatory Visit: Payer: Self-pay

## 2022-07-29 ENCOUNTER — Ambulatory Visit: Payer: Self-pay | Admitting: Licensed Clinical Social Worker

## 2022-07-29 MED FILL — Hydroxyzine HCl Tab 25 MG: ORAL | 30 days supply | Qty: 90 | Fill #0 | Status: AC

## 2022-07-30 ENCOUNTER — Ambulatory Visit: Payer: Medicaid Other | Admitting: Gerontology

## 2022-08-04 ENCOUNTER — Other Ambulatory Visit: Payer: Self-pay

## 2022-08-04 ENCOUNTER — Encounter: Payer: Self-pay | Admitting: Gerontology

## 2022-08-04 ENCOUNTER — Ambulatory Visit: Payer: Medicaid Other | Admitting: Gerontology

## 2022-08-04 VITALS — BP 143/90 | HR 87 | Temp 98.2°F | Resp 16 | Ht 72.0 in | Wt 191.6 lb

## 2022-08-04 DIAGNOSIS — R0981 Nasal congestion: Secondary | ICD-10-CM

## 2022-08-04 DIAGNOSIS — I10 Essential (primary) hypertension: Secondary | ICD-10-CM

## 2022-08-04 DIAGNOSIS — F1994 Other psychoactive substance use, unspecified with psychoactive substance-induced mood disorder: Secondary | ICD-10-CM

## 2022-08-04 DIAGNOSIS — F1010 Alcohol abuse, uncomplicated: Secondary | ICD-10-CM

## 2022-08-04 MED ORDER — LISINOPRIL 40 MG PO TABS
ORAL_TABLET | Freq: Every day | ORAL | 0 refills | Status: DC
  Filled 2022-08-04: qty 90, fill #0

## 2022-08-04 MED ORDER — THIAMINE HCL 100 MG PO TABS
100.0000 mg | ORAL_TABLET | Freq: Every day | ORAL | 0 refills | Status: DC
  Filled 2022-08-04 (×2): qty 90, 90d supply, fill #0

## 2022-08-04 MED ORDER — FOLIC ACID 1 MG PO TABS
ORAL_TABLET | Freq: Every day | ORAL | 1 refills | Status: DC
  Filled 2022-08-04: qty 30, 30d supply, fill #0

## 2022-08-04 NOTE — Patient Instructions (Signed)

## 2022-08-04 NOTE — Progress Notes (Signed)
Established Patient Office Visit  Subjective   Patient ID: Miguel [name], male    DOB: August 14, 1961  Age: 61 y.o. MRN: [id number]  Chief Complaint  Patient presents with   Follow-up    HPI  Miguel [name] is a 61 y/o male who has history of Alcohol abuse, Anxiety, Asthma, GERD, Gout, Hypertension, and Renal calculi who presents for routine follow up visit. He states that he has not had any alcoholic beverages since November 2022. He is tolerating his medications and taking them as ordered. He has had increased anxiety and insomnia lately due to family issues, and he has had to take more doses of his PRN hydroxyzine. He states that the hydroxyzine is helping with these conditions. He has an appointment with [facility] tomorrow (08/05/22) to further evaluate his mental health. He denies suicidal nor homicidal ideation. His blood pressure was elevated today at 143/90. The patient states that this may be due to feelings of anxiety this morning. He also states that he had a gout attack last week in his right wrist and right foot. He took Aleve and Biofreeze to help relieve his gout pain, but they did not work well, but currently he denies any gout symptoms.  He also c/o intermittent nasal congestion and uses nasal spray; he denies sore throat, fever and chills. He requests an ENT referral. Overall, he states that he's doing well and offers no further complaint.   Review of Systems  Constitutional: Negative.   HENT:  Positive for congestion (chronic).   Eyes: Negative.   Respiratory: Negative.    Cardiovascular: Negative.   Gastrointestinal: Negative.   Genitourinary: Negative.   Musculoskeletal: Negative.   Neurological: Negative.   Psychiatric/Behavioral:  The patient is nervous/anxious (chronic) and has insomnia (chronic).       Objective:     BP (!) 143/90 (BP Location: Left Arm, Patient Position: Sitting, Cuff Size: Large)   Pulse 87   Temp 98.2 F (36.8 C) (Oral)   Resp 16    Ht 6' (1.829 m)   Wt 191 lb 9.6 oz (86.9 kg)   SpO2 97%   BMI 25.99 kg/m  BP Readings from Last 3 Encounters:  08/04/22 (!) 143/90  06/02/22 (!) 144/91  04/02/22 124/77   Wt Readings from Last 3 Encounters:  08/04/22 191 lb 9.6 oz (86.9 kg)  06/02/22 189 lb 11.2 oz (86 kg)  04/02/22 186 lb 9.6 oz (84.6 kg)      Physical Exam Constitutional:      Appearance: Normal appearance.  HENT:     Head: Normocephalic and atraumatic.  Eyes:     Conjunctiva/sclera: Conjunctivae normal.     Pupils: Pupils are equal, round, and reactive to light.  Cardiovascular:     Rate and Rhythm: Normal rate and regular rhythm.  Pulmonary:     Effort: Pulmonary effort is normal.     Breath sounds: Normal breath sounds.  Neurological:     General: No focal deficit present.     Mental Status: He is alert and oriented to person, place, and time. Mental status is at baseline.  Psychiatric:        Mood and Affect: Mood normal.        Behavior: Behavior normal.        Thought Content: Thought content normal.        Judgment: Judgment normal.      No results found for any visits on 08/04/22.  Last CBC Lab Results  Component  Value Date   WBC 8.2 11/21/2021   HGB 13.8 11/21/2021   HCT 42.9 11/21/2021   MCV 85.3 11/21/2021   MCH 27.4 11/21/2021   RDW 16.8 (H) 11/21/2021   PLT 230 03/50/0938   Last metabolic panel Lab Results  Component Value Date   GLUCOSE 99 11/21/2021   NA 136 11/21/2021   K 4.1 11/21/2021   CL 103 11/21/2021   CO2 26 11/21/2021   BUN 12 11/21/2021   CREATININE 1.17 11/21/2021   GFRNONAA >60 11/21/2021   CALCIUM 9.4 11/21/2021   PHOS 2.7 11/29/2020   PROT 7.6 11/21/2021   ALBUMIN 4.5 11/21/2021   LABGLOB 2.6 05/22/2020   AGRATIO 1.7 05/22/2020   BILITOT 1.6 (H) 11/21/2021   ALKPHOS 67 11/21/2021   AST 20 11/21/2021   ALT 24 11/21/2021   ANIONGAP 7 11/21/2021   Last lipids Lab Results  Component Value Date   CHOL 155 05/22/2020   HDL 65 05/22/2020    LDLCALC 73 05/22/2020   TRIG 92 05/22/2020   CHOLHDL 2.4 05/22/2020   Last hemoglobin A1c Lab Results  Component Value Date   HGBA1C 5.2 11/29/2020   Last thyroid functions Lab Results  Component Value Date   TSH 0.052 (L) 11/29/2020   Last vitamin D No results found for: "25OHVITD2", "25OHVITD3", "VD25OH" Last vitamin B12 and Folate No results found for: "VITAMINB12", "FOLATE"    The 10-year ASCVD risk score (Arnett DK, et al., 2019) is: 9.7%    Assessment & Plan:    Alcohol abuse - The patient was encouraged to continue abstaining from alcohol and to see the clinic for his physical and mental health needs.  Essential hypertension - His blood pressure is elevated at 143/90, and his goal is less than 140/90. He will continue his current medication and apply the DASH diet and exercise as tolerated.  3. Nasal congestion - He will continue on Flonase and saline rinse, was encouraged to complete [facility] application for ENT Referral. He was advised to go to the ED for worsening symptoms.  4. Substance induced mood disorder ([place]) - He will continue on hydroxyzine, will follow up with [facility]. He was advised to call the Crisis help line with worsening symptoms.   Return in about 12 weeks (around 10/27/2022), or if symptoms worsen or fail to improve.    Odelia Gage

## 2022-08-05 ENCOUNTER — Ambulatory Visit: Payer: Self-pay | Admitting: Licensed Clinical Social Worker

## 2022-08-05 DIAGNOSIS — F1011 Alcohol abuse, in remission: Secondary | ICD-10-CM

## 2022-08-05 DIAGNOSIS — F411 Generalized anxiety disorder: Secondary | ICD-10-CM

## 2022-08-05 NOTE — BH Specialist Note (Signed)
Integrated Behavioral Health via Telemedicine Visit  08/05/2022 Miguel [name] [id number]  Number of [place] Clinician visits: No data recorded Session Start time: No data recorded  Session End time: No data recorded Total time in minutes: No data recorded  Referring Provider: Carlyon Shadow, NP Patient/Family location: [facility] Provider location: [facility] All persons participating in visit: Miguel [name]. [name] and [name], LCSW-A Types of Service: Telephone visit  I connected with Miguel [name] via Telephone or Video Enabled Telemedicine Application  (Video is Caregility application) and verified that I am speaking with the correct person using two identifiers. Discussed confidentiality: Yes   I discussed the limitations of telemedicine and the availability of in person appointments.  Discussed there is a possibility of technology failure and discussed alternative modes of communication if that failure occurs.  I discussed that engaging in this telemedicine visit, they consent to the provision of behavioral healthcare and the services will be billed under their insurance.  Patient and/or legal guardian expressed understanding and consented to Telemedicine visit: Yes   Presenting Concerns: Patient and/or family reports the following symptoms/concerns: The patient reports that he has been doing well since his last follow-up appointment. He noted he saw his primary care provider Carlyon Shadow, Np for a follow-up and he was given complement for making healthy lifestyle changes.  In addition Mr. Sides noted that he confided in his primary care provider about the difficulties he has experienced trying to transfer his wife to a rehabilitation center closer to home.  The patient shared that his provider offered to help him locate resources for his wife.  Mr. Cowger stated that he had gout last week and his foot and right hand were very  painful when he moved.  He shared that he became increasingly anxious due to the pain however, he was able to use his coping skills such as distracting himself from his thoughts by watching his favorite show on television.  Mr. Timberman noted that he drove to [facility] to visit with his wife yesterday and the check engine light came on during his trip.  He explained that he is deep breathing to help him stay calm in addition he tried to think positive thoughts.  The patient shared that he had an enjoyable visit with his wife and her roommate and was able to push through this difficult moments.  Overall Mr. Parkey stated that he is doing very well this week.  The patient denied any suicidal or homicidal thoughts Duration of problem: Years; Severity of problem: moderate  Patient and/or Family's Strengths/Protective Factors: Concrete supports in place (healthy food, safe environments, etc.), Sense of purpose, and Physical Health (exercise, healthy diet, medication compliance, etc.)  Goals Addressed: Patient will:  Reduce symptoms of: agitation, anxiety, insomnia, and stress   Increase knowledge and/or ability of: coping skills, healthy habits, self-management skills, and stress reduction   Demonstrate ability to: Increase healthy adjustment to current life circumstances and Increase adequate support systems for patient/family  Progress towards Goals: Ongoing  Interventions: Interventions utilized:  CBT Cognitive Behavioral Therapywas utilized by the clinician during today's follow up session. Clinician met with the patient to identify needs related to stressors and functioning, and assess and monitor for signs and symptoms of anxiety and depression, and assess safety. The Clinician processed with the patient how they have been doing since the last follow-up session. Clinician Measured the patient's anxiety and depression on a numerical scale. Clinician congratulated  the patient on using his coping skills  to deal with his current life circumstances.Clinician discussed with the patient the distinction between a lapse and relapse, associating a lapse with an initial and reversible return of worry, anxiety symptoms, or urges to avoid, and relapse with the decision to continue the fearful and avoidant patterns. Clinician encouraged the patient to continue to focus on the moment and practice mindfulness exercises this week. At the request of Carlyon Shadow, NP, the Clinician relayed the contact information for Tora Kindred, Admissions Coordinator for [facility], to the patient and encouraged him to call and make an appointment with her for assistance with finding a placement for his wife. The session ended with scheduling.  Standardized Assessments completed: GAD-7 and PHQ 9 PHQ-9=06 GAD-7= 14  Patient and/or Family Response: The patient agrred  Assessment: Patient currently experiencing see above.   Patient may benefit from see above.  Plan: Follow up with behavioral health clinician on : Thursday September 28,2023 at 11:00 AM Behavioral recommendations:  Referral(s): [place] (In Clinic)  I discussed the assessment and treatment plan with the patient and/or parent/guardian. They were provided an opportunity to ask questions and all were answered. They agreed with the plan and demonstrated an understanding of the instructions.   They were advised to call back or seek an in-person evaluation if the symptoms worsen or if the condition fails to improve as anticipated.  Lesli Albee, LCSWA

## 2022-08-13 ENCOUNTER — Ambulatory Visit: Payer: Self-pay | Admitting: Licensed Clinical Social Worker

## 2022-08-13 DIAGNOSIS — F1011 Alcohol abuse, in remission: Secondary | ICD-10-CM

## 2022-08-13 DIAGNOSIS — F331 Major depressive disorder, recurrent, moderate: Secondary | ICD-10-CM

## 2022-08-13 DIAGNOSIS — F411 Generalized anxiety disorder: Secondary | ICD-10-CM

## 2022-08-13 NOTE — BH Specialist Note (Signed)
Integrated Behavioral Health via Telemedicine Visit  08/13/2022 Miguel [name] [id number]  Number of [place] Clinician visits: No data recorded Session Start time: No data recorded  Session End time: No data recorded Total time in minutes: No data recorded  Referring Provider: Carlyon Shadow, NP Patient/Family location: The patient's Home [facility] Provider location: [facility] [place] All persons participating in visit: EMMETTE KATT and [name], LCSW-A Types of Service: Telephone visit  I connected with Miguel [name] via  Telephone or Video Enabled Telemedicine Application  (Video is Caregility application) and verified that I am speaking with the correct person using two identifiers. Discussed confidentiality: Yes   I discussed the limitations of telemedicine and the availability of in person appointments.  Discussed there is a possibility of technology failure and discussed alternative modes of communication if that failure occurs.  Patient and/or legal guardian expressed understanding and consented to Telemedicine visit: Yes   Presenting Concerns: Patient and/or family reports the following symptoms/concerns: The patient reports that he has been feeling worse since his last follow-up session. Miguel [name] noted that he continues struggling to locate a short-term rehabilitation center for his wife closer to home. He discussed other familial and situational stressors that are impacting his life. He shared feeling more anxious and explained that he had difficulty falling asleep last night despite taking over-the-counter Unisom. Miguel [name] reported feeling down and depressed more days than not, having little interest in activities he usually enjoys, and having a poor appetite daily. The patient noted that he understands he needs to eat but does not feel hungry. The patient said he struggles with concentration and frequently misplaces things in his home. Miguel [name]  said he is looking forward to seeing his wife next week. He also discussed that he has remained sober for ten months and shared compliments and encouragement that he has received from family and friends. Miguel [name] denied any suicidal or homicidal thoughts. Duration of problem: Years; Severity of problem: moderate  Patient and/or Family's Strengths/Protective Factors: Concrete supports in place (healthy food, safe environments, etc.), Sense of purpose, and Physical Health (exercise, healthy diet, medication compliance, etc.)  Goals Addressed: Patient will:  Reduce symptoms of: agitation, anxiety, depression, insomnia, and stress   Increase knowledge and/or ability of: coping skills, healthy habits, self-management skills, and stress reduction   Demonstrate ability to: Increase healthy adjustment to current life circumstances  Progress towards Goals: Ongoing  Interventions: Interventions utilized:  CBT Cognitive Behavioral Therapywas utilized by the clinician during today's follow up session. Clinician met with patient to identify needs related to stressors and functioning, and assess and monitor for signs and symptoms of anxiety and depression, and assess safety. The clinician processed with the patient how they have been doing since the last follow-up session. Clinician measured the patients anxiety and depression on a numerical scale. Clinician encouraged the patient to continue to work on calming techniques (e.g.. paced breathing, deep muscle relaxation, and calming imagery) as a strategy for responding appropriately to anxiety and the urge to avoid situations or self-isolate when they occur and move towards increasing the patients self regulation.  The session ended with scheduling. Standardized Assessments completed: GAD-7 and PHQ 9 GAD-7= 17 PHQ-9= 13  Patient and/or Family Response: The patient   Assessment: Patient currently experiencing see above.   Patient may benefit from see  above.  Plan: Follow up with behavioral health clinician on : Thursday August 20, 2022 Behavioral recommendations:  Referral(s): Integrated  Behavioral Health Services (In Clinic)  I discussed the assessment and treatment plan with the patient and/or parent/guardian. They were provided an opportunity to ask questions and all were answered. They agreed with the plan and demonstrated an understanding of the instructions.   They were advised to call back or seek an in-person evaluation if the symptoms worsen or if the condition fails to improve as anticipated.  Lesli Albee, LCSWA

## 2022-08-17 ENCOUNTER — Other Ambulatory Visit: Payer: Self-pay

## 2022-08-20 ENCOUNTER — Ambulatory Visit: Payer: Self-pay | Admitting: Licensed Clinical Social Worker

## 2022-08-20 ENCOUNTER — Emergency Department
Admission: EM | Admit: 2022-08-20 | Discharge: 2022-08-20 | Discharge status: Against Medical Advice | Payer: Medicaid Other

## 2022-08-20 DIAGNOSIS — F411 Generalized anxiety disorder: Secondary | ICD-10-CM

## 2022-08-20 DIAGNOSIS — F331 Major depressive disorder, recurrent, moderate: Secondary | ICD-10-CM

## 2022-08-20 DIAGNOSIS — R45851 Suicidal ideations: Secondary | ICD-10-CM

## 2022-08-20 NOTE — ED Notes (Signed)
No answer when called several times from lobby 

## 2022-08-20 NOTE — BH Specialist Note (Addendum)
Integrated Behavioral Health Follow Up In-Person Visit  MRN: [id number] Name: DETRON CARRAS  Number of [place] Clinician visits: No data recorded Session Start time: No data recorded  Session End time: No data recorded Total time in minutes: No data recorded  Types of Service: Telephone visit  Interpretor:No. Interpretor Name and Language: N/A  Subjective: MAICOL BOWLAND is a 61 y.o. male accompanied by  himself  Patient was referred by Carlyon Shadow, NP  for mental health. Patient reports the following symptoms/concerns: The patient reports that he has been doing worse since his last follow-up visit. He explained that he had felt distraught and afraid today. He shared that he called [facility] yesterday and left a message regarding a refill request for hydroxyzine (ATARAX) and did not receive a call back until this morning. He noted that he felt let down and abandoned by his primary care provider because she did not personally return his call. Instead, the patient reported that someone else at the [facility] called him this morning and informed him that it was not time for his refill and he needed to wait until next week. The patient stated that he had informed his therapist that he was taking a higher dose than prescribed and needed a refill last week. He said that he became upset and bought a tall beer on his way home from seeing his wife. The patient stated that he wanted to jump off a bridge and kill himself tonight. He said that he was fed up and couldn't take this anymore. He shared that he has been feeling more stressed since last week. He discussed his neighbors, his mother and brother, and other increased situational stressors since the previous week. Mr. Swiderski noted that he didn't want to live and was ashamed of himself for drinking the beer today. He stated that he was proud of his eleven months of sobriety but felt very down. He made several more  statements about ending his life and said that he was lied to when the therapist told him to call the [facility] if he needed anything this week. He shared that he has no family or friends here to call and did not feel safe driving to the hospital tonight. He noted that he no longer felt like he was going to do anything to himself and did not want to wait at the hospital for 10 hours; he just wanted his medication refilled. The patient asked if he should answer the door when the police arrived for the welfare check. He noted he was very afraid. The patient was disconnected after the police made contact with the patient.  Duration of problem: Years; Severity of problem: moderate  Objective: Mood: Anxious and Affect: Labile Risk of harm to self or others: Suicidal ideation Suicide plan to jump off a bridge tonight.   Life Context: Family and Social: see above School/Work: see above Self-Care: see above Life Changes: see above  Patient and/or Family's Strengths/Protective Factors: Concrete supports in place (healthy food, safe environments, etc.)  Goals Addressed: Patient will:  Reduce symptoms of: agitation, anxiety, depression, insomnia, and stress   Increase knowledge and/or ability of: coping skills, healthy habits, self-management skills, and stress reduction   Demonstrate ability to: Increase healthy adjustment to current life circumstances and Decrease self-medicating behaviors  Progress towards Goals: Ongoing  Interventions: Interventions utilized:  CBT Cognitive Behavioral Therapy was utilized by the clinician during today's follow up session. The clinician  met with the Patient to identify needs related to stressors and functioning, assess and monitor for signs and symptoms of anxiety and depression, and assess safety. The clinician processed with the Patient how they had been doing since the last follow-up session. Clinician determined the patient needed an immediate higher  level of care due to his suicidal ideations with plan, and access to means along with his increased risk factors such as his history of suicidal behaviors, feelings of hopelessness, and alcohol intoxication. The clinician informed the Patient that she would call [place] so they could perform a welfare check and assist the Patient in accessing crisis intervention services tonight to help him feel less afraid and remain safe. The clinician contacted Dannette Barbara, Clinical Social Work Assistant, and requested she Pharmacologist for a welfare check on the Patient. The clinician stayed on the line with the Patient until the [place] made contact with the Patient. The clinician will follow up with Julian Hy, LCSW (Clinician's Supervisor) and the [facility] Case Consultation team at the [facility].  Standardized Assessments completed: GAD-7 and PHQ 9 GAD-7= 21 PHQ-9= 13   Assessment: Patient currently experiencing see above.   Patient may benefit from see above.  Plan: Follow up with behavioral health clinician on : TBD Behavioral recommendations:  Referral(s): [place] (In Clinic) "From scale of 1-10, how likely are you to follow plan?":   Lesli Albee, LCSWA

## 2022-08-20 NOTE — ED Notes (Signed)
Pt returns to ED lobby, yelling and cursing about wait time; informed pt that he has been called twice with no answer; explained triage process and acuity concerns and that he will be triaged accordingly; pt cont to curse and yell then slams lobby sign down on floor and walks out

## 2022-08-25 ENCOUNTER — Telehealth: Payer: Self-pay | Admitting: Licensed Clinical Social Worker

## 2022-08-25 ENCOUNTER — Ambulatory Visit: Payer: Medicaid Other | Admitting: Licensed Clinical Social Worker

## 2022-08-25 ENCOUNTER — Telehealth: Payer: Self-pay | Admitting: Gerontology

## 2022-08-25 NOTE — Telephone Encounter (Signed)
Called pt to discuss appointment with Nira Conn due to pt going to ED this past wkd. Pt stated "he has been with out his medicine for two days now and does not know what he is going to do" I explained to pt that we are not able to fill his prescription due to it being to early for the refill. Pt requested if we could send in a few pills. Explained to pt there is no subscribing doctor here this week so we would have to wait until next week to get the prescription filled and "he stated what am I supposed to do" I gave him the crisis line number and [facility] info. He stated "he did not want to talk to anyone he just wants his medicine" I explained to pt again how we could not get his refill today. PT does not understand why we cannot refill his medication and demanded to speak to [facility]. Sent patient call to [facility] to finish.

## 2022-08-26 ENCOUNTER — Ambulatory Visit: Payer: Self-pay | Admitting: Licensed Clinical Social Worker

## 2022-08-26 ENCOUNTER — Ambulatory Visit: Payer: Medicaid Other | Admitting: Licensed Clinical Social Worker

## 2022-08-26 DIAGNOSIS — F331 Major depressive disorder, recurrent, moderate: Secondary | ICD-10-CM

## 2022-08-26 DIAGNOSIS — F411 Generalized anxiety disorder: Secondary | ICD-10-CM

## 2022-08-26 NOTE — BH Specialist Note (Signed)
Integrated Behavioral Health via Telemedicine Visit  08/26/2022 Miguel [name] [id number]  Number of [place] Clinician visits: No data recorded Session Start time: No data recorded  Session End time: No data recorded Total time in minutes: No data recorded  Referring Provider: Carlyon Shadow, NP Patient/Family location: The patient's home. [facility] Provider location: [facility] [place] All persons participating in visit: DHANUSH JOKERST and [name], LCSWA Types of Service: Telephone visit  I connected with Miguel [name] Telephone or Video Enabled Telemedicine Application  (Video is Caregility application) and verified that I am speaking with the correct person using two identifiers. Discussed confidentiality: Yes   I discussed the limitations of telemedicine and the availability of in person appointments.  Discussed there is a possibility of technology failure and discussed alternative modes of communication if that failure occurs.  Patient and/or legal guardian expressed understanding and consented to Telemedicine visit: Yes   Presenting Concerns: Patient and/or family reports the following symptoms/concerns: The patient noted that he is doing worse since his last follow-up visit.  He discussed his difficulties obtaining a refill for hydroxyzine. The patient noted that he is feeling increasingly anxious and on edge. He shared he is struggling to fall asleep and remain asleep. The patient denied any current suicidal or homicidal thoughts. He shared that he was just upset that he was not able to get a refill of his medication and he had a couple of tall beers yesterday which made him feel worse. The patient noted that he has no intention of ending his life. He shared that if those feelings returned he would talk to his wife.  Duration of problem: Years; Severity of problem: moderate  Patient and/or Family's Strengths/Protective Factors: Concrete supports in  place (healthy food, safe environments, etc.), Sense of purpose, and Physical Health (exercise, healthy diet, medication compliance, etc.)  Goals Addressed: Patient will:  Reduce symptoms of: agitation, anxiety, depression, insomnia, mood instability, and stress   Increase knowledge and/or ability of: coping skills, healthy habits, self-management skills, and stress reduction   Demonstrate ability to: Increase healthy adjustment to current life circumstances  Progress towards Goals: Ongoing  Interventions: Interventions utilized:  CBT Cognitive Behavioral Therapywas utilized by the clinician during today's follow up session. Clinician met with patient to identify needs related to stressors and functioning, and assess and monitor for signs and symptoms of anxiety and depression, and assess safety. The clinician processed with the patient how they have been doing since the last follow-up session.  Clinician measured the patient's anxiety and depression on the numerical scale.  Clinician explained the recommendations of the [facility] team case consultation were for him to receive a refill of hydroxyzine 25 MG tablets and he was prescribed to take these three times a day as needed for treatment of anxiety. Clinician provided the patient with crisis referrals: [facility] health Crisis line, [facility] mobile crisis line, 988, 911, and the nearest emergency department and encouraged the patient to utilize these services should he experience a mental health or substance use crisis. Clinician worked with the patient to further develop his safety plan which included texting his wife, utilizing the above crisis resources and taking his medications exactly as prescribed. The session ended with scheduling.  Standardized Assessments completed: GAD-7 and PHQ 9 GAD-7 =21 PHQ-9 =3  Patient and/or Family Response: The patient agreed to text his wife and call 41 or go to the nearest emergency department should he experience a mental  health or substance  use crisis.  Assessment: Patient currently experiencing see above.   Patient may benefit from see above.  Plan: Follow up with behavioral health clinician on : September 02, 2022 at 3:00 PM. Behavioral recommendations:  Referral(s): [place] (In Clinic)  I discussed the assessment and treatment plan with the patient and/or parent/guardian. They were provided an opportunity to ask questions and all were answered. They agreed with the plan and demonstrated an understanding of the instructions.   They were advised to call back or seek an in-person evaluation if the symptoms worsen or if the condition fails to improve as anticipated.  Lesli Albee, LCSWA

## 2022-08-27 ENCOUNTER — Other Ambulatory Visit: Payer: Self-pay | Admitting: Gerontology

## 2022-08-27 DIAGNOSIS — F411 Generalized anxiety disorder: Secondary | ICD-10-CM

## 2022-08-31 ENCOUNTER — Other Ambulatory Visit: Payer: Self-pay

## 2022-08-31 MED ORDER — HYDROXYZINE HCL 25 MG PO TABS
25.0000 mg | ORAL_TABLET | Freq: Three times a day (TID) | ORAL | 0 refills | Status: DC | PRN
  Filled 2022-08-31: qty 90, 30d supply, fill #0

## 2022-09-01 ENCOUNTER — Telehealth: Payer: Self-pay | Admitting: Gerontology

## 2022-09-01 ENCOUNTER — Other Ambulatory Visit: Payer: Self-pay

## 2022-09-01 NOTE — Telephone Encounter (Signed)
[facility] on [facility]/27/2022. Another rx was sent in to [facility] as well. I called and cancelled that rx. Pt is not eligible for new refill until 09/27/2022.

## 2022-09-02 ENCOUNTER — Ambulatory Visit: Payer: Self-pay | Admitting: Licensed Clinical Social Worker

## 2022-09-02 DIAGNOSIS — F411 Generalized anxiety disorder: Secondary | ICD-10-CM

## 2022-09-02 DIAGNOSIS — F331 Major depressive disorder, recurrent, moderate: Secondary | ICD-10-CM

## 2022-09-02 NOTE — BH Specialist Note (Signed)
Integrated Behavioral Health via Telemedicine Visit  09/02/2022 Miguel [name] [id number]  Number of [place] Clinician visits: No data recorded Session Start time: No data recorded  Session End time: No data recorded Total time in minutes: No data recorded  Referring Provider: Carlyon Shadow, NP Patient/Family location: The patient's home [facility] Provider location: [facility] [place] All persons participating in visit: Zethan Alfieri. [name] and [name], LCSW-A Types of Service: Telephone visit  I connected with Miguel [name] via  Telephone or Video Enabled Telemedicine Application  (Video is Caregility application) and verified that I am speaking with the correct person using two identifiers. Discussed confidentiality: Yes   I discussed the limitations of telemedicine and the availability of in person appointments.  Discussed there is a possibility of technology failure and discussed alternative modes of communication if that failure occurs.  Patient and/or legal guardian expressed understanding and consented to Telemedicine visit: Yes   Presenting Concerns: Patient and/or family reports the following symptoms/concerns: The patient reports that he has been doing slightly better since the last follow-up visit. Miguel [name] stated that he has been going to bed between 1:00 and 3:00 AM then waking up a couple of hours later sweating. The patient endorsed experiencing nightmares most nights this week and recalled that his brother was in his dream and he felt trapped as if he was going to die. Miguel [name] shared that he has not been going out this week and has been staying at home because he does not feel good enough. He noted that he is feeling shaky, nervous, and on edge daily. The patient requested the clinician speak to his primary care provider to see if his medication may need to be adjusted or if something can be added to help him sleep.  He noted that he has  previously tried to take melatonin but did not feel it worked for him. The patient stated that he has been feeling bad about himself because he relapsed last week and has drank several tall beers 3-4 times this past week. The patient discussed family and financial stressors impacting his life.  He noted that he did speak to his wife and she is aware of his relapse and remains supportive of him during this time. The patient denied any suicidal or homicidal thoughts. Duration of problem: Years; Severity of problem: moderate  Patient and/or Family's Strengths/Protective Factors: Sense of purpose, Physical Health (exercise, healthy diet, medication compliance, etc.), and Caregiver has knowledge of parenting & child development  Goals Addressed: Patient will:  Reduce symptoms of: agitation, anxiety, depression, insomnia, and stress   Increase knowledge and/or ability of: coping skills, healthy habits, self-management skills, and stress reduction   Demonstrate ability to: Increase healthy adjustment to current life circumstances  Progress towards Goals: Other  Interventions: Interventions utilized:  CBT Cognitive Behavioral Therapywas utilized by the clinician during today's follow up session. Clinician met with patient to identify needs related to stressors and functioning, and assess and monitor for signs and symptoms of anxiety and depression, and assess safety. The clinician processed with the patient how they have been doing since the last follow-up session. Clinician measured the patient's anxiety and depression on a numerical scale. Clinician provided the patient a safe judgment free space to vent his frustration regarding his current life circumstances.  Clinician offered to take his case for psychiatric consultation on Tuesday, October 24 at 11:00 AM regarding the patient's request for medication adjustments to address his difficulty falling  asleep and remaining asleep and increased anxiety and  depression symptoms. Clinician reviewed deep breathing and muscle relaxation exercises and encouraged the patient to continue to practice these even in times when he did not feel distressed. The session ended with scheduling. Standardized Assessments completed: GAD-7 and PHQ 9 GAD-7 = 17 PHQ-9 = 14  Patient and/or Family Response: The patient was agreeable to clinician taking his case for consultation with Dr. Kipp Laurence, MD psychiatric consultant and Carlyon Shadow, NP on Tuesday, October 24 at 11:00 AM. Assessment.  Patient currently experiencing see above.   Patient may benefit from see above.  Plan: Follow up with behavioral health clinician on : Wednesday, September 09, 2022 at 12:00 noon Behavioral recommendations:  Referral(s): [place] (In Clinic)  I discussed the assessment and treatment plan with the patient and/or parent/guardian. They were provided an opportunity to ask questions and all were answered. They agreed with the plan and demonstrated an understanding of the instructions.   They were advised to call back or seek an in-person evaluation if the symptoms worsen or if the condition fails to improve as anticipated.  Lesli Albee, LCSWA

## 2022-09-09 ENCOUNTER — Ambulatory Visit: Payer: Self-pay | Admitting: Licensed Clinical Social Worker

## 2022-09-09 DIAGNOSIS — F331 Major depressive disorder, recurrent, moderate: Secondary | ICD-10-CM

## 2022-09-09 DIAGNOSIS — F411 Generalized anxiety disorder: Secondary | ICD-10-CM

## 2022-09-09 NOTE — BH Specialist Note (Signed)
Integrated Behavioral Health via Telemedicine Visit  09/09/2022 PRATHIK AMAN [id number]  Number of [place] Clinician visits: No data recorded Session Start time: No data recorded  Session End time: No data recorded Total time in minutes: No data recorded  Referring Provider: Carlyon Shadow, NP Patient/Family location: The Patient's Home  [facility] Provider location: [facility] [place] All persons participating in visit: Denney Shein and [name], LCSW-A Types of Service: Telephone visit  I connected with [name] via  Telephone or Video Enabled Telemedicine Application  (Video is Caregility application) and verified that I am speaking with the correct person using two identifiers. Discussed confidentiality: Yes   I discussed the limitations of telemedicine and the availability of in person appointments.  Discussed there is a possibility of technology failure and discussed alternative modes of communication if that failure occurs.  Patient and/or legal guardian expressed understanding and consented to Telemedicine visit: Yes   Presenting Concerns: Patient and/or family reports the following symptoms/concerns: The patient reports that he has been doing about the same since his last follow-up session.  Mr. Fadden shared that he had trouble sleeping several nights this week because of a stuffy nose.  The patient discussed relationship stressors currently impacting his life.  Mr. Santoro noted that for several days he struggled to leave his home because he felt anxious and on edge.  He stated that he has tried to remain positive and to take it easy this week and watching television was helpful.  Mr. Rakestraw denied any suicidal or homicidal thoughts. Duration of problem: Years; Severity of problem: moderate  Patient and/or Family's Strengths/Protective Factors: Concrete supports in place (healthy food, safe environments, etc.), Sense of purpose, and Physical  Health (exercise, healthy diet, medication compliance, etc.)  Goals Addressed: Patient will:  Reduce symptoms of: agitation, anxiety, depression, insomnia, and stress   Increase knowledge and/or ability of: coping skills, healthy habits, self-management skills, and stress reduction   Demonstrate ability to: Increase healthy adjustment to current life circumstances  Progress towards Goals: Ongoing  Interventions: Interventions utilized:  CBT Cognitive Behavioral Therapy was utilized by the clinician during today's follow up session. Clinician met with patient to identify needs related to stressors and functioning, and assess and monitor for signs and symptoms of anxiety and depression, and assess safety. The clinician processed with the patient how they have been doing since the last follow-up session.  Clinician measured the patient's anxiety and depression on a numerical scale. Clinician continued to teach the patient calming relaxation and mindfulness skills and how to discriminate better between relaxation and tension and taught the client how to apply the skills in their daily life. Clinician assigned the patient homework in which they will use mindfulness skills daily gradually applying them progressively from not anxiety provoking to anxiety provoking situations and worked with the patient to resolve obstacles toward sustained implementation.  The session ended with scheduling. Standardized Assessments completed: GAD-7 and PHQ 9 GAD-7=15 PHQ-9=07  Assessment: Patient currently experiencing see above.   Patient may benefit from see above.  Plan: Follow up with behavioral health clinician on : September 22, 2022 at 2:00 PM Behavioral recommendations:  Referral(s): [place] (In Clinic)  I discussed the assessment and treatment plan with the patient and/or parent/guardian. They were provided an opportunity to ask questions and all were answered. They agreed  with the plan and demonstrated an understanding of the instructions.   They were advised to call back or  seek an in-person evaluation if the symptoms worsen or if the condition fails to improve as anticipated.  Lesli Albee, LCSWA

## 2022-09-22 ENCOUNTER — Ambulatory Visit: Payer: Self-pay | Admitting: Licensed Clinical Social Worker

## 2022-09-22 DIAGNOSIS — F411 Generalized anxiety disorder: Secondary | ICD-10-CM

## 2022-09-22 DIAGNOSIS — F331 Major depressive disorder, recurrent, moderate: Secondary | ICD-10-CM
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
## 2022-09-23 ENCOUNTER — Other Ambulatory Visit: Payer: Self-pay | Admitting: Gerontology

## 2022-09-23 ENCOUNTER — Other Ambulatory Visit: Payer: Self-pay

## 2022-09-23 DIAGNOSIS — F411 Generalized anxiety disorder: Secondary | ICD-10-CM

## 2022-09-23 MED ORDER — HYDROXYZINE HCL 25 MG PO TABS
25.0000 mg | ORAL_TABLET | Freq: Three times a day (TID) | ORAL | 0 refills | Status: DC | PRN
  Filled 2022-09-23: qty 90, 30d supply, fill #0

## 2022-09-28 ENCOUNTER — Other Ambulatory Visit: Payer: Self-pay

## 2022-09-29 ENCOUNTER — Other Ambulatory Visit: Payer: Self-pay

## 2022-09-30 ENCOUNTER — Other Ambulatory Visit: Payer: Self-pay

## 2022-10-05 ENCOUNTER — Other Ambulatory Visit: Payer: Self-pay

## 2022-10-14 ENCOUNTER — Ambulatory Visit: Payer: Self-pay | Admitting: Licensed Clinical Social Worker

## 2022-10-14 ENCOUNTER — Other Ambulatory Visit: Payer: Self-pay

## 2022-10-14 DIAGNOSIS — F339 Major depressive disorder, recurrent, unspecified: Secondary | ICD-10-CM

## 2022-10-14 DIAGNOSIS — F411 Generalized anxiety disorder: Secondary | ICD-10-CM

## 2022-10-14 NOTE — BH Specialist Note (Signed)
Integrated Behavioral Health via Telemedicine Visit  10/14/2022 Miguel [name] [id number]  Number of [place] Clinician visits: No data recorded Session Start time: No data recorded  Session End time: No data recorded Total time in minutes: No data recorded  Referring Provider: Carlyon Shadow, NP Patient/Family location: The Patient's Home  [facility] Provider location: [facility]  All persons participating in visit: [place]. [name] and [name], LCSW-A Types of Service: Telephone visit  I connected with Miguel [name] via  Telephone or Video Enabled Telemedicine Application  (Video is Caregility application) and verified that I am speaking with the correct person using two identifiers. Discussed confidentiality: Yes   I discussed the limitations of telemedicine and the availability of in person appointments.  Discussed there is a possibility of technology failure and discussed alternative modes of communication if that failure occurs.   Patient and/or legal guardian expressed understanding and consented to Telemedicine visit: Yes   Presenting Concerns: Patient and/or family reports the following symptoms/concerns: The patient reported that he has been doing well since his last appointment. He shared that he spent Thanksgiving and celebrated his birthday with his wife.  He shared that his wife had sent him several birthday presents in the mail and that made him feel really good today that she was thinking about him.  Miguel [name] shared that he spoke to his mother and brothers whom live in [place] and noted that they were getting along well which helped him to relax and enjoying the conversation.  The patient shared that he enjoyed watching football this week and relaxing at home.  He explained that he is sleeping better and enjoying an overall good mood.  Miguel [name] denied any suicidal or homicidal thoughts.  Severity of problem: moderate  Patient  and/or Family's Strengths/Protective Factors: Concrete supports in place (healthy food, safe environments, etc.), Sense of purpose, and Physical Health (exercise, healthy diet, medication compliance, etc.)  Goals Addressed: Patient will:  Reduce symptoms of: agitation, anxiety, depression, insomnia, and stress   Increase knowledge and/or ability of: coping skills, healthy habits, self-management skills, and stress reduction   Demonstrate ability to: Increase healthy adjustment to current life circumstances  Progress towards Goals: Ongoing  Interventions: Interventions utilized:  Motivational Interviewing was utilized by the clinician during today's follow up session. Clinician met with patient to identify needs related to stressors and functioning, and assess and monitor for signs and symptoms of anxiety and depression, and assess safety. The clinician processed with the patient how they have been doing since the last follow-up session.  Clinician measured the patient's anxiety and depression on a numerical scale. Clinician established a comfortable and nonjudgmental space for the client to openly discuss his feelings and behaviors.  Clinician engaged and actively reflecting back with the patient shared to ensure understanding and to validate his experiences.  Clinician utilized open ended questions guiding the patient to explore his own motivation and goals, helping him recognize discrepancies between his current behaviors and desired outcomes.  Clinician supported the client's autonomy, encouraging him to identify his own strategies for change while offering guidance and resources when appropriate.  Throughout the process the clinician affirmed the client strengths and progress, fostering a sense of self efficacy and empowerment in his journey towards better mental health.  Clinician encouraged the patient to continue to utilize his coping skills to do deal with his current life circumstances.  The  session ended with scheduling. Standardized Assessments completed: GAD-7 and  PHQ 9 GAD-7 =16 PHQ-9 =06  Patient and/or Family Response: The patient agreed to continue to practice his coping skills daily.  Assessment: Patient currently experiencing see above.   Patient may benefit from see above.  Plan: Follow up with behavioral health clinician on : Wednesday, October 21, 2022 at 12:00 noon. Behavioral recommendations:  Referral(s): [place] (In Clinic)  I discussed the assessment and treatment plan with the patient and/or parent/guardian. They were provided an opportunity to ask questions and all were answered. They agreed with the plan and demonstrated an understanding of the instructions.   They were advised to call back or seek an in-person evaluation if the symptoms worsen or if the condition fails to improve as anticipated.  Lesli Albee, LCSWA

## 2022-10-19 ENCOUNTER — Other Ambulatory Visit: Payer: Self-pay

## 2022-10-20 ENCOUNTER — Other Ambulatory Visit: Payer: Self-pay

## 2022-10-21 ENCOUNTER — Ambulatory Visit: Payer: Self-pay | Admitting: Licensed Clinical Social Worker

## 2022-10-27 ENCOUNTER — Ambulatory Visit: Payer: Self-pay | Admitting: Licensed Clinical Social Worker

## 2022-10-27 ENCOUNTER — Encounter: Payer: Self-pay | Admitting: Gerontology

## 2022-10-27 ENCOUNTER — Ambulatory Visit: Payer: Medicaid Other | Admitting: Gerontology

## 2022-10-27 ENCOUNTER — Other Ambulatory Visit: Payer: Self-pay

## 2022-10-27 VITALS — BP 143/93 | HR 98 | Wt 190.6 lb

## 2022-10-27 DIAGNOSIS — I10 Essential (primary) hypertension: Secondary | ICD-10-CM

## 2022-10-27 DIAGNOSIS — F411 Generalized anxiety disorder: Secondary | ICD-10-CM

## 2022-10-27 DIAGNOSIS — F1010 Alcohol abuse, uncomplicated: Secondary | ICD-10-CM

## 2022-10-27 MED ORDER — HYDROXYZINE HCL 25 MG PO TABS
25.0000 mg | ORAL_TABLET | Freq: Three times a day (TID) | ORAL | 0 refills | Status: DC | PRN
  Filled 2022-10-27: qty 90, 30d supply, fill #0

## 2022-10-27 MED ORDER — METOPROLOL TARTRATE 25 MG PO TABS
25.0000 mg | ORAL_TABLET | Freq: Every day | ORAL | 0 refills | Status: DC
  Filled 2022-10-27: qty 30, 30d supply, fill #0
  Filled 2022-12-09: qty 30, 30d supply, fill #1

## 2022-10-27 MED ORDER — LISINOPRIL 40 MG PO TABS
ORAL_TABLET | Freq: Every day | ORAL | 0 refills | Status: DC
  Filled 2022-10-27: qty 30, 30d supply, fill #0
  Filled 2022-12-09: qty 30, 30d supply, fill #1

## 2022-10-27 NOTE — Telephone Encounter (Signed)
Called the patient to assess for safety and provided crisis referral resources.

## 2022-10-27 NOTE — Patient Instructions (Signed)

## 2022-10-27 NOTE — Progress Notes (Signed)
Established Patient Office Visit  Subjective   Patient ID: Miguel [name], male    DOB: 24-Mar-1961  Age: 61 y.o. MRN: [id number]  Chief Complaint  Patient presents with   Follow-up    HPI  Miguel [name] is a 61 y/o male who has history of Alcohol abuse, Anxiety, Asthma, GERD, Gout, Hypertension, and Renal calculi who presents for routine follow up visit and medication refill. He states that he's compliant with his medications, denies side effects and continues to make healthy lifestyle changes. his blood pressure was elevated during visit and heart rate at 110 bpm, states that he was rushing to his appointment. He denies chest pain, shortness of breath, dizziness. He states that he didn't get enough sleep, was awake at 3 am and couldn't go back to sleep. His blood pressure decreased to 143/93 and heart rate was 98. He states that his mood is good, denies suicidal nor homicidal ideation, states that he has not had any alcoholic beverage in a year. Overall, he states that he's doing well and offers no further complaint.  Review of Systems  Constitutional: Negative.   Eyes: Negative.   Respiratory: Negative.    Cardiovascular: Negative.   Skin: Negative.   Neurological: Negative.   Endo/Heme/Allergies: Negative.   Psychiatric/Behavioral: Negative.        Objective:     BP (!) 150/92   Pulse (!) 110   Wt 190 lb 9.6 oz (86.5 kg)   SpO2 99%   BMI 25.85 kg/m  BP Readings from Last 3 Encounters:  10/27/22 (!) 150/92  08/04/22 (!) 143/90  06/02/22 (!) 144/91   Wt Readings from Last 3 Encounters:  10/27/22 190 lb 9.6 oz (86.5 kg)  08/04/22 191 lb 9.6 oz (86.9 kg)  06/02/22 189 lb 11.2 oz (86 kg)      Physical Exam HENT:     Head: Normocephalic and atraumatic.  Eyes:     Pupils: Pupils are equal, round, and reactive to light.  Cardiovascular:     Rate and Rhythm: Normal rate.     Pulses: Normal pulses.     Heart sounds: Normal heart sounds.  Pulmonary:     Effort:  Pulmonary effort is normal.     Breath sounds: Normal breath sounds.  Skin:    General: Skin is warm.  Neurological:     General: No focal deficit present.     Mental Status: He is alert and oriented to person, place, and time. Mental status is at baseline.  Psychiatric:        Mood and Affect: Mood normal.        Behavior: Behavior normal.        Thought Content: Thought content normal.        Judgment: Judgment normal.      No results found for any visits on 10/27/22.  Last CBC Lab Results  Component Value Date   WBC 8.2 11/21/2021   HGB 13.8 11/21/2021   HCT 42.9 11/21/2021   MCV 85.3 11/21/2021   MCH 27.4 11/21/2021   RDW 16.8 (H) 11/21/2021   PLT 230 00/93/8182   Last metabolic panel Lab Results  Component Value Date   GLUCOSE 99 11/21/2021   NA 136 11/21/2021   K 4.1 11/21/2021   CL 103 11/21/2021   CO2 26 11/21/2021   BUN 12 11/21/2021   CREATININE 1.17 11/21/2021   GFRNONAA >60 11/21/2021   CALCIUM 9.4 11/21/2021   PHOS 2.7 11/29/2020   PROT 7.6  11/21/2021   ALBUMIN 4.5 11/21/2021   LABGLOB 2.6 05/22/2020   AGRATIO 1.7 05/22/2020   BILITOT 1.6 (H) 11/21/2021   ALKPHOS 67 11/21/2021   AST 20 11/21/2021   ALT 24 11/21/2021   ANIONGAP 7 11/21/2021   Last lipids Lab Results  Component Value Date   CHOL 155 05/22/2020   HDL 65 05/22/2020   LDLCALC 73 05/22/2020   TRIG 92 05/22/2020   CHOLHDL 2.4 05/22/2020   Last hemoglobin A1c Lab Results  Component Value Date   HGBA1C 5.2 11/29/2020   Last thyroid functions Lab Results  Component Value Date   TSH 0.052 (L) 11/29/2020      The 10-year ASCVD risk score (Arnett DK, et al., 2019) is: 11.6%    Assessment & Plan:   1. Generalized anxiety disorder - He will continue current medication, and will follow up with another Behavioral health provider since he has active Medicaid. - hydrOXYzine (ATARAX) 25 MG tablet; Take 1 tablet (25 mg total) by mouth 3 (three) times daily as needed for anxiety.   Dispense: 90 tablet; Refill: 0  2. Essential hypertension - His blood pressure is improving, he will continue current medication, DASH diet and exercise as tolerated. - lisinopril (ZESTRIL) 40 MG tablet; TAKE ONE TABLET BY MOUTH ONCE EVERY DAY.  Dispense: 90 tablet; Refill: 0 - metoprolol tartrate (LOPRESSOR) 25 MG tablet; Take 1 tablet (25 mg total) by mouth once daily.  Dispense: 90 tablet; Refill: 0    No follow-ups on file. He has no follow up visit since his Medicaid is active. [place] wishes him well with his care.   Loda Bialas Jerold Coombe, NP

## 2022-10-29 ENCOUNTER — Ambulatory Visit: Payer: Self-pay | Admitting: Licensed Clinical Social Worker

## 2022-10-29 DIAGNOSIS — F411 Generalized anxiety disorder: Secondary | ICD-10-CM

## 2022-10-29 NOTE — BH Specialist Note (Signed)
Integrated Behavioral Health via Telemedicine Visit  10/29/2022 Miguel [name] [id number]  Number of [place] Clinician visits: No data recorded Session Start time: No data recorded  Session End time: No data recorded Total time in minutes: No data recorded  Referring Provider: Carlyon Shadow, NP  Patient/Family location: The patients home [facility] Provider location: [facility] [place] All persons participating in visit: Miguel [name]. [name] and [name], LCSW-A Types of Service: Telephone visit  I connected with Miguel [name] via  Telephone or Video Enabled Telemedicine Application  (Video is Caregility application) and verified that I am speaking with the correct person using two identifiers. Discussed confidentiality: Yes   I discussed the limitations of telemedicine and the availability of in person appointments.  Discussed there is a possibility of technology failure and discussed alternative modes of communication if that failure occurs.  Patient and/or legal guardian expressed understanding and consented to Telemedicine visit: Yes   Presenting Concerns: Patient and/or family reports the following symptoms/concerns: The patient reports that he has been doing about the same since his last follow-up session.  The patient explained that he has not been to see his wife since Thanksgiving but plans to go this week.  He shared that he is hopeful to have his wife transferred to [facility] so that he may be able to see her more often.  Mr. Dockter stated that he had a refill for hydroxyzine and plan to go to the pharmacy to pick up tomorrow morning.  The patient noted that overall he is doing well and is looking forward to his dinner tonight and spending time watching television.  Mr. Pierron reported that he has active Medicaid and will be calling to establish care with a new provider this week.  The patient denied any suicidal or homicidal thoughts. Duration of  problem: Years; Severity of problem: moderate  Patient and/or Family's Strengths/Protective Factors: Concrete supports in place (healthy food, safe environments, etc.), Sense of purpose, and Physical Health (exercise, healthy diet, medication compliance, etc.)  Goals Addressed: Patient will:  Reduce symptoms of: agitation, anxiety, depression, insomnia, and stress   Increase knowledge and/or ability of: coping skills, healthy habits, self-management skills, and stress reduction   Demonstrate ability to: Increase healthy adjustment to current life circumstances  Progress towards Goals: Ongoing  Interventions: Interventions utilized:  CBT Cognitive Behavioral Therapy was utilized by the clinician during today's follow up session. Clinician met with patient to identify needs related to stressors and functioning, and assess and monitor for signs and symptoms of anxiety and depression, and assess safety. The clinician processed with the patient how they have been doing since the last follow-up session. Clinician measured the patient's anxiety and depression on a numerical scale. Clinician processed with the patient his progress in therapy. Clinician informed the patient that he was receiving referrals to [place] and provided him with their contact information and instructions on how to establish care. Clinician reviewed with the patient crisis resources and encouraged him to reach out to them should he experience a mental health or substance use crisis.  Standardized Assessments completed: GAD-7 and PHQ 9 GAD-7= 16 PHQ-9= 04   Assessment: Patient currently experiencing see above.   Patient may benefit from see above.  Plan: Follow up with behavioral health clinician on :  Behavioral recommendations:  Referral(s): [place] (LME/Outside Clinic) Patient has Medicaid and was referred to [place]  I discussed the assessment and treatment plan with the patient and/or  parent/guardian. They were provided an opportunity to ask questions and all were answered. They agreed with the plan and demonstrated an understanding of the instructions.   They were advised to call back or seek an in-person evaluation if the symptoms worsen or if the condition fails to improve as anticipated.  Lesli Albee, LCSWA

## 2022-11-18 ENCOUNTER — Telehealth: Payer: Self-pay

## 2022-11-18 NOTE — Telephone Encounter (Signed)
End of therapy letter sent to pt to inform pt of end of services here at [facility]. If pt needs another appt to talk about end of therapy or to help find another therapist. PT has until Dec 15, 2022 as stated in the letter.

## 2022-12-02 ENCOUNTER — Telehealth: Payer: Self-pay | Admitting: General Practice

## 2022-12-02 NOTE — Telephone Encounter (Signed)
Called pt to see he needed any assistance finding a therapist/PCP. No answer. Left msg

## 2022-12-07 ENCOUNTER — Other Ambulatory Visit: Payer: Self-pay

## 2022-12-07 ENCOUNTER — Emergency Department
Admission: EM | Admit: 2022-12-07 | Discharge: 2022-12-07 | Disposition: A | Discharge status: Home / Self Care | Payer: Medicaid Other | Attending: Emergency Medicine | Admitting: Emergency Medicine

## 2022-12-07 DIAGNOSIS — G4700 Insomnia, unspecified: Secondary | ICD-10-CM | POA: Diagnosis not present

## 2022-12-07 DIAGNOSIS — F439 Reaction to severe stress, unspecified: Secondary | ICD-10-CM | POA: Insufficient documentation

## 2022-12-07 LAB — CBC WITH DIFFERENTIAL/PLATELET
Abs Immature Granulocytes: 0.05 10*3/uL (ref 0.00–0.07)
Basophils Absolute: 0.1 10*3/uL (ref 0.0–0.1)
Basophils Relative: 1 %
Eosinophils Absolute: 0.1 10*3/uL (ref 0.0–0.5)
Eosinophils Relative: 1 %
HCT: 41.2 % (ref 39.0–52.0)
Hemoglobin: 13.5 g/dL (ref 13.0–17.0)
Immature Granulocytes: 1 %
Lymphocytes Relative: 15 %
Lymphs Abs: 1.6 10*3/uL (ref 0.7–4.0)
MCH: 28.5 pg (ref 26.0–34.0)
MCHC: 32.8 g/dL (ref 30.0–36.0)
MCV: 86.9 fL (ref 80.0–100.0)
Monocytes Absolute: 1.1 10*3/uL — ABNORMAL HIGH (ref 0.1–1.0)
Monocytes Relative: 11 %
Neutro Abs: 7.6 10*3/uL (ref 1.7–7.7)
Neutrophils Relative %: 71 %
Platelets: 261 10*3/uL (ref 150–400)
RBC: 4.74 MIL/uL (ref 4.22–5.81)
RDW: 14.7 % (ref 11.5–15.5)
WBC: 10.5 10*3/uL (ref 4.0–10.5)
nRBC: 0 % (ref 0.0–0.2)

## 2022-12-07 LAB — BASIC METABOLIC PANEL
Anion gap: 10 (ref 5–15)
BUN: 18 mg/dL (ref 8–23)
CO2: 22 mmol/L (ref 22–32)
Calcium: 9.1 mg/dL (ref 8.9–10.3)
Chloride: 102 mmol/L (ref 98–111)
Creatinine, Ser: 1.16 mg/dL (ref 0.61–1.24)
GFR, Estimated: >60 mL/min (ref >60)
Glucose, Bld: 125 mg/dL — ABNORMAL HIGH (ref 70–99)
Potassium: 4.7 mmol/L (ref 3.5–5.1)
Sodium: 134 mmol/L — ABNORMAL LOW (ref 135–145)

## 2022-12-07 MED ORDER — LORAZEPAM 0.5 MG PO TABS: 0.5000 mg | ORAL_TABLET | Freq: Every evening | ORAL | 0 refills | Status: DC | PRN

## 2022-12-07 NOTE — ED Triage Notes (Signed)
Pt to ED from [facility]. Pt has not been able to see primary care because he now has medicaid. Pt states that he has several things going on today. Pt states that: He is having trouble sleeping His mouth is dry He is having pain in his right side He is more forgetful BP has been elevated at home He is shaky He is having night sweats and nightmares. Pt has appt with [facility] on 1/30.  Pt is currently in NAD.

## 2022-12-07 NOTE — ED Notes (Signed)
First Nurse Note: Pt to ED via [facility] clinic for dizziness and shaking, RUQ, and other complaints. Pt ambulatory without difficulty.    BP: 168/101

## 2022-12-07 NOTE — ED Provider Notes (Signed)
[facility] Provider Note    Event Date/Time   First MD Initiated Contact with Patient 12/07/22 1642     (approximate)   History   multiple medical complaints   HPI  Miguel [name] is a 62 y.o. male who presents with multiple medical complaints but chief among them is the complaint of insomnia.  Patient reports he is under significant stress and has not been able to sleep at night.  He reports no SI or HI, not interested in seeing psych today, requesting help with sleep aid.     Physical Exam   Triage Vital Signs: ED Triage Vitals  Enc Vitals Group     BP 12/07/22 1457 120/81     Pulse Rate 12/07/22 1457 72     Resp 12/07/22 1457 16     Temp 12/07/22 1500 98.1 F (36.7 C)     Temp Source 12/07/22 1500 Oral     SpO2 12/07/22 1457 100 %     Weight 12/07/22 1458 86.6 kg (191 lb)     Height 12/07/22 1458 1.829 m (6')     Head Circumference --      Peak Flow --      Pain Score 12/07/22 1457 8     Pain Loc --      Pain Edu? --      Excl. in [place]? --     Most recent vital signs: Vitals:   12/07/22 1457 12/07/22 1500  BP: 120/81   Pulse: 72   Resp: 16   Temp:  98.1 F (36.7 C)  SpO2: 100%      General: Awake, no distress.  CV:  Good peripheral perfusion.  Resp:  Normal effort.  Abd:  No distention.  Other:     ED Results / Procedures / Treatments   Labs (all labs ordered are listed, but only abnormal results are displayed) Labs Reviewed  BASIC METABOLIC PANEL - Abnormal; Notable for the following components:      Result Value   Sodium 134 (*)    Glucose, Bld 125 (*)    All other components within normal limits  CBC WITH DIFFERENTIAL/PLATELET - Abnormal; Notable for the following components:   Monocytes Absolute 1.1 (*)    All other components within normal limits     EKG     RADIOLOGY     PROCEDURES:  Critical Care performed:   Procedures   MEDICATIONS ORDERED IN ED: Medications - No data to  display   IMPRESSION / MDM / [place] / ED COURSE  I reviewed the triage vital signs and the nursing notes. Patient's presentation is most consistent with acute, uncomplicated illness.   Patient presents with complaints of insomnia as above.  Patient is known to our department, I have seen him several times in the past.   Lab work today is reassuring,  Discussed with patient he reports Ativan has worked well for him in the past for sleep aid, I feel this is reasonable and small quantities, appropriate for discharge at this time with prescriptions      FINAL CLINICAL IMPRESSION(S) / ED DIAGNOSES   Final diagnoses:  Insomnia, unspecified type     Rx / DC Orders   ED Discharge Orders          Ordered    LORazepam (ATIVAN) 0.5 MG tablet  At bedtime PRN        12/07/22 1651  Note:  This document was prepared using Dragon voice recognition software and may include unintentional dictation errors.   Lavonia Drafts, MD 12/07/22 [phone]

## 2022-12-07 NOTE — ED Provider Triage Note (Signed)
Emergency Medicine Provider Triage Evaluation Note  Miguel [name] , a 62 y.o. male  was evaluated in triage.  Pt complains of elevated BP x1 month, insomnia. Reports he feels very anxious. No longer drinking ETOH. No SI/HI. Reports wife broke her leg and has been hospitalized for 1 year and he's feeling anxious.  Review of Systems  Positive: insomnia Negative: Cp, sob, fever, abd pain, n/v/d  Physical Exam  There were no vitals taken for this visit. Gen:   Awake, no distress. Anxious appearing Resp:  Normal effort  MSK:   Moves extremities without difficulty Other:    Medical Decision Making  Medically screening exam initiated at 2:52 PM.  Appropriate orders placed.  Miguel [name] was informed that the remainder of the evaluation will be completed by another provider, this initial triage assessment does not replace that evaluation, and the importance of remaining in the ED until their evaluation is complete.     Miguel Old, PA-C 12/07/22 1456

## 2022-12-09 ENCOUNTER — Other Ambulatory Visit: Payer: Self-pay

## 2022-12-17 ENCOUNTER — Other Ambulatory Visit: Payer: Self-pay

## 2022-12-22 ENCOUNTER — Inpatient Hospital Stay
Admission: EM | Admit: 2022-12-22 | Discharge: 2022-12-25 | DRG: 378 | Disposition: A | Discharge status: Home / Self Care | Payer: Medicaid Other | Attending: Student | Admitting: Student

## 2022-12-22 ENCOUNTER — Other Ambulatory Visit: Payer: Self-pay

## 2022-12-22 ENCOUNTER — Telehealth: Payer: Self-pay | Admitting: Licensed Clinical Social Worker

## 2022-12-22 ENCOUNTER — Encounter: Payer: Self-pay | Admitting: Internal Medicine

## 2022-12-22 ENCOUNTER — Inpatient Hospital Stay: Payer: Medicaid Other

## 2022-12-22 DIAGNOSIS — F429 Obsessive-compulsive disorder, unspecified: Secondary | ICD-10-CM | POA: Diagnosis present

## 2022-12-22 DIAGNOSIS — K222 Esophageal obstruction: Secondary | ICD-10-CM | POA: Diagnosis present

## 2022-12-22 DIAGNOSIS — F411 Generalized anxiety disorder: Secondary | ICD-10-CM | POA: Diagnosis present

## 2022-12-22 DIAGNOSIS — E875 Hyperkalemia: Principal | ICD-10-CM | POA: Diagnosis present

## 2022-12-22 DIAGNOSIS — I10 Essential (primary) hypertension: Secondary | ICD-10-CM | POA: Diagnosis present

## 2022-12-22 DIAGNOSIS — J4489 Other specified chronic obstructive pulmonary disease: Secondary | ICD-10-CM | POA: Diagnosis present

## 2022-12-22 DIAGNOSIS — K5730 Diverticulosis of large intestine without perforation or abscess without bleeding: Secondary | ICD-10-CM | POA: Diagnosis present

## 2022-12-22 DIAGNOSIS — M109 Gout, unspecified: Secondary | ICD-10-CM | POA: Diagnosis present

## 2022-12-22 DIAGNOSIS — K640 First degree hemorrhoids: Secondary | ICD-10-CM | POA: Diagnosis present

## 2022-12-22 DIAGNOSIS — K625 Hemorrhage of anus and rectum: Secondary | ICD-10-CM | POA: Diagnosis present

## 2022-12-22 DIAGNOSIS — D72825 Bandemia: Secondary | ICD-10-CM | POA: Diagnosis not present

## 2022-12-22 DIAGNOSIS — Z803 Family history of malignant neoplasm of breast: Secondary | ICD-10-CM

## 2022-12-22 DIAGNOSIS — F4010 Social phobia, unspecified: Secondary | ICD-10-CM | POA: Diagnosis present

## 2022-12-22 DIAGNOSIS — F332 Major depressive disorder, recurrent severe without psychotic features: Secondary | ICD-10-CM | POA: Diagnosis present

## 2022-12-22 DIAGNOSIS — M549 Dorsalgia, unspecified: Secondary | ICD-10-CM | POA: Diagnosis present

## 2022-12-22 DIAGNOSIS — F1994 Other psychoactive substance use, unspecified with psychoactive substance-induced mood disorder: Secondary | ICD-10-CM | POA: Diagnosis present

## 2022-12-22 DIAGNOSIS — E861 Hypovolemia: Secondary | ICD-10-CM | POA: Diagnosis present

## 2022-12-22 DIAGNOSIS — K920 Hematemesis: Secondary | ICD-10-CM | POA: Diagnosis present

## 2022-12-22 DIAGNOSIS — K922 Gastrointestinal hemorrhage, unspecified: Secondary | ICD-10-CM | POA: Insufficient documentation

## 2022-12-22 DIAGNOSIS — Z818 Family history of other mental and behavioral disorders: Secondary | ICD-10-CM

## 2022-12-22 DIAGNOSIS — K921 Melena: Secondary | ICD-10-CM | POA: Diagnosis present

## 2022-12-22 DIAGNOSIS — G8929 Other chronic pain: Secondary | ICD-10-CM | POA: Diagnosis present

## 2022-12-22 DIAGNOSIS — Z87891 Personal history of nicotine dependence: Secondary | ICD-10-CM

## 2022-12-22 DIAGNOSIS — E538 Deficiency of other specified B group vitamins: Secondary | ICD-10-CM | POA: Diagnosis present

## 2022-12-22 DIAGNOSIS — K219 Gastro-esophageal reflux disease without esophagitis: Secondary | ICD-10-CM | POA: Diagnosis present

## 2022-12-22 DIAGNOSIS — R12 Heartburn: Secondary | ICD-10-CM | POA: Diagnosis present

## 2022-12-22 DIAGNOSIS — I158 Other secondary hypertension: Secondary | ICD-10-CM | POA: Diagnosis not present

## 2022-12-22 DIAGNOSIS — R1011 Right upper quadrant pain: Secondary | ICD-10-CM | POA: Diagnosis present

## 2022-12-22 DIAGNOSIS — K449 Diaphragmatic hernia without obstruction or gangrene: Secondary | ICD-10-CM | POA: Diagnosis present

## 2022-12-22 DIAGNOSIS — Z1152 Encounter for screening for COVID-19: Secondary | ICD-10-CM | POA: Diagnosis not present

## 2022-12-22 DIAGNOSIS — D72829 Elevated white blood cell count, unspecified: Secondary | ICD-10-CM | POA: Diagnosis present

## 2022-12-22 DIAGNOSIS — N179 Acute kidney failure, unspecified: Secondary | ICD-10-CM | POA: Diagnosis present

## 2022-12-22 DIAGNOSIS — Z811 Family history of alcohol abuse and dependence: Secondary | ICD-10-CM

## 2022-12-22 DIAGNOSIS — T39315A Adverse effect of propionic acid derivatives, initial encounter: Secondary | ICD-10-CM | POA: Diagnosis present

## 2022-12-22 DIAGNOSIS — F1021 Alcohol dependence, in remission: Secondary | ICD-10-CM | POA: Diagnosis present

## 2022-12-22 DIAGNOSIS — D62 Acute posthemorrhagic anemia: Secondary | ICD-10-CM | POA: Diagnosis present

## 2022-12-22 DIAGNOSIS — R10811 Right upper quadrant abdominal tenderness: Secondary | ICD-10-CM | POA: Diagnosis not present

## 2022-12-22 DIAGNOSIS — K6389 Other specified diseases of intestine: Secondary | ICD-10-CM | POA: Diagnosis present

## 2022-12-22 DIAGNOSIS — Z8719 Personal history of other diseases of the digestive system: Secondary | ICD-10-CM

## 2022-12-22 DIAGNOSIS — Z9049 Acquired absence of other specified parts of digestive tract: Secondary | ICD-10-CM

## 2022-12-22 DIAGNOSIS — Z87442 Personal history of urinary calculi: Secondary | ICD-10-CM

## 2022-12-22 LAB — BASIC METABOLIC PANEL
Anion gap: 8 (ref 5–15)
BUN: 35 mg/dL — ABNORMAL HIGH (ref 8–23)
CO2: 18 mmol/L — ABNORMAL LOW (ref 22–32)
Calcium: 8.9 mg/dL (ref 8.9–10.3)
Chloride: 106 mmol/L (ref 98–111)
Creatinine, Ser: 1.87 mg/dL — ABNORMAL HIGH (ref 0.61–1.24)
GFR, Estimated: 40 mL/min — ABNORMAL LOW (ref >60)
Glucose, Bld: 111 mg/dL — ABNORMAL HIGH (ref 70–99)
Potassium: 6.4 mmol/L (ref 3.5–5.1)
Sodium: 132 mmol/L — ABNORMAL LOW (ref 135–145)

## 2022-12-22 LAB — URINE DRUG SCREEN, QUALITATIVE (ARMC ONLY)
Amphetamines, Ur Screen: NOT DETECTED
Barbiturates, Ur Screen: NOT DETECTED
Benzodiazepine, Ur Scrn: NOT DETECTED
Cannabinoid 50 Ng, Ur ~~LOC~~: POSITIVE — AB
Cocaine Metabolite,Ur ~~LOC~~: NOT DETECTED
MDMA (Ecstasy)Ur Screen: NOT DETECTED
Methadone Scn, Ur: NOT DETECTED
Opiate, Ur Screen: NOT DETECTED
Phencyclidine (PCP) Ur S: NOT DETECTED
Tricyclic, Ur Screen: POSITIVE — AB

## 2022-12-22 LAB — PROCALCITONIN: Procalcitonin: <0.1 ng/mL

## 2022-12-22 LAB — URINALYSIS, ROUTINE W REFLEX MICROSCOPIC
Bilirubin Urine: NEGATIVE
Glucose, UA: NEGATIVE mg/dL
Hgb urine dipstick: NEGATIVE
Ketones, ur: NEGATIVE mg/dL
Leukocytes,Ua: NEGATIVE
Nitrite: NEGATIVE
Protein, ur: NEGATIVE mg/dL
Specific Gravity, Urine: 1.009 (ref 1.005–1.030)
pH: 5 (ref 5.0–8.0)

## 2022-12-22 LAB — COMPREHENSIVE METABOLIC PANEL
ALT: 17 U/L (ref 0–44)
AST: 16 U/L (ref 15–41)
Albumin: 4.3 g/dL (ref 3.5–5.0)
Alkaline Phosphatase: 92 U/L (ref 38–126)
Anion gap: 7 (ref 5–15)
BUN: 34 mg/dL — ABNORMAL HIGH (ref 8–23)
CO2: 21 mmol/L — ABNORMAL LOW (ref 22–32)
Calcium: 9.5 mg/dL (ref 8.9–10.3)
Chloride: 104 mmol/L (ref 98–111)
Creatinine, Ser: 1.69 mg/dL — ABNORMAL HIGH (ref 0.61–1.24)
GFR, Estimated: 45 mL/min — ABNORMAL LOW (ref >60)
Glucose, Bld: 109 mg/dL — ABNORMAL HIGH (ref 70–99)
Potassium: >7.5 mmol/L (ref 3.5–5.1)
Sodium: 132 mmol/L — ABNORMAL LOW (ref 135–145)
Total Bilirubin: 0.8 mg/dL (ref 0.3–1.2)
Total Protein: 8 g/dL (ref 6.5–8.1)

## 2022-12-22 LAB — CBC
HCT: 42.9 % (ref 39.0–52.0)
Hemoglobin: 13.5 g/dL (ref 13.0–17.0)
MCH: 27.8 pg (ref 26.0–34.0)
MCHC: 31.5 g/dL (ref 30.0–36.0)
MCV: 88.3 fL (ref 80.0–100.0)
Platelets: 284 10*3/uL (ref 150–400)
RBC: 4.86 MIL/uL (ref 4.22–5.81)
RDW: 15 % (ref 11.5–15.5)
WBC: 12 10*3/uL — ABNORMAL HIGH (ref 4.0–10.5)
nRBC: 0 % (ref 0.0–0.2)

## 2022-12-22 LAB — MAGNESIUM: Magnesium: 1.5 mg/dL — ABNORMAL LOW (ref 1.7–2.4)

## 2022-12-22 LAB — PHOSPHORUS: Phosphorus: 2.2 mg/dL — ABNORMAL LOW (ref 2.5–4.6)

## 2022-12-22 LAB — ETHANOL: Alcohol, Ethyl (B): <10 mg/dL (ref <10)

## 2022-12-22 MED ORDER — ACETAMINOPHEN 325 MG PO TABS
650.0000 mg | ORAL_TABLET | Freq: Four times a day (QID) | ORAL | Status: DC | PRN
  Administered 2022-12-22: 650 mg via ORAL
  Filled 2022-12-22: qty 2

## 2022-12-22 MED ORDER — INSULIN ASPART 100 UNIT/ML IJ SOLN
10.0000 [IU] | Freq: Once | INTRAMUSCULAR | Status: AC
  Administered 2022-12-22: 10 [IU] via INTRAVENOUS
  Filled 2022-12-22: qty 1

## 2022-12-22 MED ORDER — MAGNESIUM SULFATE IN D5W 1-5 GM/100ML-% IV SOLN
1.0000 g | Freq: Once | INTRAVENOUS | Status: AC
  Administered 2022-12-22: 1 g via INTRAVENOUS
  Filled 2022-12-22: qty 100

## 2022-12-22 MED ORDER — PANTOPRAZOLE INFUSION (NEW) - SIMPLE MED
8.0000 mg/h | INTRAVENOUS | Status: DC
  Administered 2022-12-22 – 2022-12-25 (×7): 8 mg/h via INTRAVENOUS
  Filled 2022-12-22 (×6): qty 100

## 2022-12-22 MED ORDER — LORAZEPAM 0.5 MG PO TABS
0.5000 mg | ORAL_TABLET | Freq: Every evening | ORAL | Status: DC | PRN
  Administered 2022-12-23: 0.5 mg via ORAL
  Filled 2022-12-22: qty 1

## 2022-12-22 MED ORDER — SODIUM CHLORIDE 0.9 % IV BOLUS
1000.0000 mL | Freq: Once | INTRAVENOUS | Status: AC
  Administered 2022-12-22: 1000 mL via INTRAVENOUS

## 2022-12-22 MED ORDER — ACETAMINOPHEN 650 MG RE SUPP: 650.0000 mg | Freq: Four times a day (QID) | RECTAL | Status: DC | PRN

## 2022-12-22 MED ORDER — ALBUTEROL SULFATE (2.5 MG/3ML) 0.083% IN NEBU: 2.5000 mg | INHALATION_SOLUTION | Freq: Four times a day (QID) | RESPIRATORY_TRACT | Status: DC | PRN

## 2022-12-22 MED ORDER — THIAMINE HCL 100 MG PO TABS
100.0000 mg | ORAL_TABLET | Freq: Every day | ORAL | Status: DC
  Administered 2022-12-22 – 2022-12-25 (×3): 100 mg via ORAL
  Filled 2022-12-22 (×6): qty 1

## 2022-12-22 MED ORDER — SENNOSIDES-DOCUSATE SODIUM 8.6-50 MG PO TABS: 1.0000 | ORAL_TABLET | Freq: Every evening | ORAL | Status: DC | PRN

## 2022-12-22 MED ORDER — SODIUM ZIRCONIUM CYCLOSILICATE 10 G PO PACK
10.0000 g | PACK | Freq: Two times a day (BID) | ORAL | Status: DC
  Administered 2022-12-22: 10 g via ORAL
  Filled 2022-12-22 (×2): qty 1

## 2022-12-22 MED ORDER — SODIUM PHOSPHATES 45 MMOLE/15ML IV SOLN
15.0000 mmol | Freq: Once | INTRAVENOUS | Status: AC
  Administered 2022-12-23: 15 mmol via INTRAVENOUS
  Filled 2022-12-22: qty 5

## 2022-12-22 MED ORDER — VITAMIN C 500 MG PO TABS
500.0000 mg | ORAL_TABLET | Freq: Every day | ORAL | Status: DC
  Administered 2022-12-22 – 2022-12-25 (×3): 500 mg via ORAL
  Filled 2022-12-22 (×3): qty 1

## 2022-12-22 MED ORDER — HYDRALAZINE HCL 20 MG/ML IJ SOLN: 5.0000 mg | Freq: Four times a day (QID) | INTRAMUSCULAR | Status: DC | PRN

## 2022-12-22 MED ORDER — CALCIUM GLUCONATE-NACL 1-0.675 GM/50ML-% IV SOLN
1.0000 g | Freq: Once | INTRAVENOUS | Status: AC
  Administered 2022-12-22: 1000 mg via INTRAVENOUS
  Filled 2022-12-22: qty 50

## 2022-12-22 MED ORDER — LORAZEPAM 2 MG/ML IJ SOLN: 1.0000 mg | INTRAMUSCULAR | Status: DC | PRN

## 2022-12-22 MED ORDER — LORAZEPAM 2 MG/ML IJ SOLN: 0.5000 mg | INTRAMUSCULAR | Status: DC | PRN

## 2022-12-22 MED ORDER — ONDANSETRON HCL 4 MG/2ML IJ SOLN
4.0000 mg | Freq: Four times a day (QID) | INTRAMUSCULAR | Status: DC | PRN
  Administered 2022-12-23 – 2022-12-24 (×3): 4 mg via INTRAVENOUS
  Filled 2022-12-22 (×3): qty 2

## 2022-12-22 MED ORDER — ONDANSETRON HCL 4 MG PO TABS: 4.0000 mg | ORAL_TABLET | Freq: Four times a day (QID) | ORAL | Status: DC | PRN

## 2022-12-22 MED ORDER — FOLIC ACID 1 MG PO TABS
1.0000 mg | ORAL_TABLET | Freq: Every day | ORAL | Status: DC
  Administered 2022-12-22 – 2022-12-25 (×3): 1 mg via ORAL
  Filled 2022-12-22 (×4): qty 1

## 2022-12-22 MED ORDER — PANTOPRAZOLE SODIUM 40 MG IV SOLR: 40.0000 mg | Freq: Two times a day (BID) | INTRAVENOUS | Status: DC

## 2022-12-22 MED ORDER — SODIUM CHLORIDE 0.9 % IV SOLN: INTRAVENOUS | Status: AC

## 2022-12-22 MED ORDER — INSULIN ASPART 100 UNIT/ML IJ SOLN
5.0000 [IU] | Freq: Once | INTRAMUSCULAR | Status: AC
  Administered 2022-12-22: 5 [IU] via SUBCUTANEOUS
  Filled 2022-12-22: qty 1

## 2022-12-22 MED ORDER — DEXTROSE 50 % IV SOLN
1.0000 | Freq: Once | INTRAVENOUS | Status: AC
  Administered 2022-12-22: 50 mL via INTRAVENOUS
  Filled 2022-12-22: qty 50

## 2022-12-22 MED ORDER — DEXTROSE 10 % IV SOLN: Freq: Once | INTRAVENOUS | Status: AC

## 2022-12-22 MED ORDER — PANTOPRAZOLE SODIUM 40 MG PO TBEC: 40.0000 mg | DELAYED_RELEASE_TABLET | Freq: Every day | ORAL | Status: DC

## 2022-12-22 MED ORDER — PANTOPRAZOLE 80MG IVPB - SIMPLE MED
80.0000 mg | Freq: Once | INTRAVENOUS | Status: AC
  Administered 2022-12-22: 80 mg via INTRAVENOUS
  Filled 2022-12-22: qty 100

## 2022-12-22 NOTE — Assessment & Plan Note (Signed)
-   Presumed secondary to chronic daily Aleve use - Counseled patient on cessation believes - Patient is status post sodium chloride 1 L bolus - Sodium chloride 125 mL/h, 1 day ordered - Repeat BMP in a.m.

## 2022-12-22 NOTE — ED Triage Notes (Signed)
First Nurse Note:  Pt via EMS from home. Pt c/o rectal bleeding for the past 4-5 days. Pt also reports nausea also. States the bleeding has been constant but not also. EMS gave '4mg'$  of Zofran IV. Pt is A&OX4 and NAD.  EMS VS: 99% on RA, 117/70 BP, 83 HR.

## 2022-12-22 NOTE — Assessment & Plan Note (Signed)
-   PPI resumed °

## 2022-12-22 NOTE — Assessment & Plan Note (Signed)
-   Hydralazine 5 mg IV every 6 hours as needed for SBP greater than 175, 4 days ordered

## 2022-12-22 NOTE — Assessment & Plan Note (Addendum)
Presumed secondary to acute kidney injury - Status post insulin aspart 10 units, low, one-time dose, D50 1 amp, calcium gluconate 1 g IV, sodium chloride 1 L bolus. - Ordered insulin aspart 5 units subcutaneous one-time dose and D10 infusion, 1 dose of 250 mL/h over 30 minutes + 500 ml, discussed with nursing - Sodium chloride 125 mL/h, 1 day ordered - BMP in a.m.

## 2022-12-22 NOTE — H&P (Addendum)
Addendum:  Reviewed magnesium and phosphorus level:  Hypomagnesemia (1.5) - magnesium sulfate 1 g IV one-time dose ordered Hypophosphatemia (2.2) - sodium phosphate and dextrose 5%, 15 mmol IV one-time dose ordered  Hyperkalemia-repeat potassium check stat   History and Physical   MARTON MALIZIA LJQ:[id number] DOB: 05-Sep-1961 DOA: 12/22/2022  PCP: Pcp, No  Patient coming from: home via EMS  I have personally briefly reviewed patient's old medical records in [place].  Chief Concern: Rectal bleed, right upper quadrant abdominal pain, nausea.  HPI: [name] is a 62 year old male with history of alcohol abuse, chronic right upper quadrant abdominal pain, hypertension, GERD, who presents to the emergency department for chief concerns of right upper quadrant abdominal pain, rectal bleeding, nausea and weakness.  Initial vitals in the ED showed temperature of 98.3, respiration rate of 20, heart rate of 79, blood pressure 95/67, SpO2 of 96% on room air.  Serum sodium 132, potassium 6.4, chloride 106, bicarb 18, BUN of 35, serum creatinine of 1.87, EGFR 40, nonfasting blood glucose 111, WBC 12, hemoglobin 13.5, platelets of 284.  ED treatment: Aspart 10 units, Lokelma, D50 (1 ampule), calcium gluconate 1 g IV, sodium chloride 1 L bolus ----------------------------- At bedside patient was able to tell me his name, age, location, current calendar year.  He reports he has been experiencing right upper quadrant abdominal pain for the past 5-6 months.  He denies trauma to his person.  He reports that over the last month he has been taking daily Aleve in order to alleviate the nagging right upper quadrant abdominal pain.  He reports that over the last 7 days he has been experiencing bright red blood per rectum that has worsened, with dribbling blood with every bowel movement.  He endorses associated generalized weakness.  Of note, he states that 4 to 5 days ago he experienced brown vomitus  that was coffee-ground like.  He endorses generalized nausea and no vomiting since then.  Denies chest pain, shortness of breath, swelling of his lower extremities, dysuria, hematuria. He denies cough, fever, chills.   He states he has never experienced the symptoms before.  He further denies BC powder, Goody powder, ibuprofen, Motrin use.  Social history: He lives at home with his wife, however his wife broke a leg about one year ago and she has been in rehab for about 1 year. He denies current EtOH use.  He formally drank increased amounts of vodka, and the last time he had vodka was about 1.5 years ago.  He denies recreational drug use.  He denies tobacco use. He is retired and formerly worked as a Pension scheme manager.  ROS: Constitutional: no weight change, no fever ENT/Mouth: no sore throat, no rhinorrhea Eyes: no eye pain, no vision changes Cardiovascular: no chest pain, no dyspnea,  no edema, no palpitations Respiratory: no cough, no sputum, no wheezing Gastrointestinal: + nausea, + vomiting, no diarrhea, no constipation, + BRBPR Genitourinary: no urinary incontinence, no dysuria, no hematuria Musculoskeletal: no arthralgias, no myalgias Skin: no skin lesions, no pruritus, Neuro: + weakness, no loss of consciousness, no syncope Psych: no anxiety, no depression, no decrease appetite Heme/Lymph: no bruising, no bleeding  ED Course: Discussed with emergency medicine provider, patient requiring hospitalization for chief concerns of hyperkalemia and acute kidney injury.  Assessment/Plan  Principal Problem:   Hyperkalemia Active Problems:   AKI (acute kidney injury) ([place])   Hypertension   Substance induced mood disorder (HCC)   Severe recurrent major depression without psychotic  features (HCC)   GERD (gastroesophageal reflux disease)   Social anxiety disorder   Leukocytosis   GI bleed   RUQ abdominal tenderness   Assessment and Plan:  * Hyperkalemia Presumed secondary to acute  kidney injury - Status post insulin aspart 10 units, low, one-time dose, D50 1 amp, calcium gluconate 1 g IV, sodium chloride 1 L bolus. - Ordered insulin aspart 5 units subcutaneous one-time dose and D10 infusion, 1 dose of 250 mL/h over 30 minutes + 500 ml, discussed with nursing - Sodium chloride 125 mL/h, 1 day ordered - BMP in a.m.  AKI (acute kidney injury) ([place]) - Presumed secondary to chronic daily Aleve use - Counseled patient on cessation believes - Patient is status post sodium chloride 1 L bolus - Sodium chloride 125 mL/h, 1 day ordered - Repeat BMP in a.m.  RUQ abdominal tenderness And right lower quadrant tenderness - CT abdomen/pel was deferred on admission due to acute kidney injury  GI bleed - With patient endorsing 1 episode of coffee-ground emesis and bright red blood per rectum - Protonix bolus and gtt. has been initiated - Gastroenterology has been consulted via secure chat - Clear liquid now and n.p.o. after midnight - Repeat CBC in a.m.  Leukocytosis - Check a UA, procalcitonin, portable chest x-ray  Social anxiety disorder - Resumed home Ativan 0.5 mg nightly as needed for anxiety - Ativan 0.5 mg IV every 4 hours as needed for seizure and anxiety not relieved with home p.o. Ativan, 3 doses ordered  GERD (gastroesophageal reflux disease) - PPI resumed  Hypertension - Hydralazine 5 mg IV every 6 hours as needed for SBP greater than 175, 4 days ordered  Chart reviewed.   Colonoscopy and upper endoscopy on 05/28/2020: Impression was read as diverticular disease sigmoid colon, multiple polyps which were resected.  Patient was advised to follow-up for repeat colonoscopy in 5 years.  DVT prophylaxis: TED hose Code Status: Full code Diet: Clear liquids; n.p.o. 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    OCD (obsessive compulsive disorder)    Renal colic    Past Surgical History:  Procedure Laterality Date   CHOLECYSTECTOMY  2012   COLONOSCOPY     COLONOSCOPY WITH PROPOFOL N/A 05/28/2020   Procedure: COLONOSCOPY WITH PROPOFOL;  Surgeon: [name], MD;  Location: [facility] ENDOSCOPY;  Service: Gastroenterology;  Laterality: N/A;   EXTRACORPOREAL SHOCK WAVE LITHOTRIPSY Left 01/12/2019   Procedure: EXTRACORPOREAL SHOCK WAVE LITHOTRIPSY (ESWL);  Surgeon: Billey Co, MD;  Location: [facility] ORS;  Service: Urology;  Laterality: Left;   UPPER GI ENDOSCOPY     Social History:  reports that he quit smoking about 41 years ago. His smoking use included cigarettes. He has never used smokeless tobacco. He reports that he does not currently use alcohol after a past usage of about 24.0 standard drinks of alcohol per week. He reports that he does not currently use drugs after having used the following drugs: Marijuana.  No Known Allergies Family History  Problem Relation Age of  Onset   Alcohol abuse Father    Breast cancer Mother 43   Anxiety disorder Brother    Other Maternal Grandmother        unknown medical history   Other Maternal Grandfather        unknow medical history   Other Paternal Grandmother        unknown medical history   Other Paternal Grandfather        unknown medical history   Healthy Brother    Family history: Family history reviewed and not pertinent.  Prior to Admission medications   Medication Sig Start Date End Date Taking? Authorizing Provider  albuterol (PROAIR HFA) 108 (90 Base) MCG/ACT inhaler Inhale 2 puffs into the lungs once every 4 (four) hours as needed. 03/04/22   Iloabachie, Chioma E, NP   ascorbic acid (VITAMIN C) 500 MG tablet Take by mouth. 08/01/19   [provider]  fluticasone furoate-vilanterol (BREO ELLIPTA) 200-25 MCG/ACT AEPB Inhale one puff by mouth daily 04/30/22   Iloabachie, Chioma E, NP  folic acid (FOLVITE) 1 MG tablet TAKE ONE TABLET BY MOUTH ONCE EVERY DAY. 08/04/22   Iloabachie, Chioma E, NP  hydrOXYzine (ATARAX) 25 MG tablet Take 1 tablet (25 mg total) by mouth 3 (three) times daily as needed for anxiety. 10/27/22   Iloabachie, Chioma E, NP  lisinopril (ZESTRIL) 40 MG tablet TAKE ONE TABLET BY MOUTH ONCE EVERY DAY. 10/27/22   Iloabachie, Chioma E, NP  LORazepam (ATIVAN) 0.5 MG tablet Take 1 tablet (0.5 mg total) by mouth at bedtime as needed for anxiety. 12/07/22 12/07/23  Lavonia Drafts, MD  metoprolol tartrate (LOPRESSOR) 25 MG tablet Take 1 tablet (25 mg total) by mouth once daily. 10/27/22   Iloabachie, Chioma E, NP  omeprazole (PRILOSEC) 20 MG capsule Take 20 mg by mouth daily.    [provider]  thiamine (VITAMIN B1) 100 MG tablet Take 1 tablet (100 mg total) by mouth daily. 08/04/22 08/04/23  Caryl Asp E, NP   Physical Exam: Vitals:   12/22/22 1251 12/22/22 1522 12/22/22 1630  BP: 95/67 100/70 114/73  Pulse: 79 69 70  Resp: '20 18 14  '$ Temp: 98.3 F (36.8 C) 98.2 F (36.8 C)   TempSrc: Oral Oral   SpO2: 96% 98% 100%  Weight: 86.6 kg    Height: 6' (1.829 m)     Constitutional: appears age appropriate, NAD, calm, comfortable Eyes: PERRL, lids and conjunctivae normal ENMT: Mucous membranes are moist. Posterior pharynx clear of any exudate or lesions. Age-appropriate dentition. Hearing appropriate Neck: normal, supple, no masses, no thyromegaly Respiratory: clear to auscultation bilaterally, no wheezing, no crackles. Normal respiratory effort. No accessory muscle use.  Cardiovascular: Regular rate and rhythm, no murmurs / rubs / gallops. No extremity edema. 2+ pedal pulses. No carotid bruits.  Abdomen: obese abdomen, + RUQ and  lower quadrant tenderness, no masses palpated, no hepatosplenomegaly. Bowel sounds positive.  Musculoskeletal: no clubbing / cyanosis. No joint deformity upper and lower extremities. Good ROM, no contractures, no atrophy. Normal muscle tone.  Skin: no rashes, lesions, ulcers. No induration Neurologic: Sensation intact. Strength 5/5 in all 4.  Psychiatric: Normal judgment and insight. Alert and oriented x 3. Normal mood.   EKG: independently reviewed, showing sinus tachycardia with rate of 107, QTc 504  Chest x-ray on Admission: I personally reviewed and I agree with radiologist reading as below.  DG Chest Port 1 View  Result Date: 12/22/2022 CLINICAL DATA:  Dyspnea on exertion. EXAM: PORTABLE CHEST 1 VIEW  COMPARISON:  October 11, 2021 FINDINGS: Cardiomediastinal silhouette is normal. Mediastinal contours appear intact. Low lung volumes with streaky peribronchial airspace opacities which may represent consolidation or atelectasis. Osseous structures are without acute abnormality. Soft tissues are grossly normal. IMPRESSION: Low lung volumes with streaky peribronchial airspace opacities which may represent consolidation or atelectasis. Electronically Signed   By: Fidela Salisbury M.D.   On: 12/22/2022 18:16    Labs on Admission: I have personally reviewed following labs  CBC: Recent Labs  Lab 12/22/22 1253  WBC 12.0*  HGB 13.5  HCT 42.9  MCV 88.3  PLT 270   Basic Metabolic Panel: Recent Labs  Lab 12/22/22 1354 12/22/22 1528 12/22/22 1811  NA 132* 132*  --   K >7.5* 6.4*  --   CL 104 106  --   CO2 21* 18*  --   GLUCOSE 109* 111*  --   BUN 34* 35*  --   CREATININE 1.69* 1.87*  --   CALCIUM 9.5 8.9  --   MG  --   --  1.5*  PHOS  --   --  2.2*   GFR: Estimated Creatinine Clearance: 44.4 mL/min (A) (by C-G formula based on SCr of 1.87 mg/dL (H)).  Liver Function Tests: Recent Labs  Lab 12/22/22 1354  AST 16  ALT 17  ALKPHOS 92  BILITOT 0.8  PROT 8.0  ALBUMIN 4.3    Urine analysis:    Component Value Date/Time   COLORURINE AMBER (A) 11/21/2021 2041   APPEARANCEUR CLOUDY (A) 11/21/2021 2041   APPEARANCEUR Hazy 06/26/2014 2238   LABSPEC 1.021 11/21/2021 2041   LABSPEC 1.036 06/26/2014 2238   PHURINE 5.0 11/21/2021 2041   GLUCOSEU NEGATIVE 11/21/2021 2041   GLUCOSEU Negative 06/26/2014 2238   HGBUR NEGATIVE 11/21/2021 2041   BILIRUBINUR NEGATIVE 11/21/2021 2041   BILIRUBINUR 1+ 06/26/2014 2238   KETONESUR 5 (A) 11/21/2021 2041   PROTEINUR 30 (A) 11/21/2021 2041   NITRITE NEGATIVE 11/21/2021 2041   LEUKOCYTESUR NEGATIVE 11/21/2021 2041   LEUKOCYTESUR 1+ 06/26/2014 2238   CRITICAL CARE Performed by: Dr. Tobie Poet  Total critical care time: 35 minutes  Critical care time was exclusive of separately billable procedures and treating other patients.  Critical care was necessary to treat or prevent imminent or life-threatening deterioration.  Critical care was time spent personally by me on the following activities: development of treatment plan with patient and/or surrogate as well as nursing, discussions with consultants, evaluation of patient's response to treatment, examination of patient, obtaining history from patient or surrogate, ordering and performing treatments and interventions, ordering and review of laboratory studies, ordering and review of radiographic studies, pulse oximetry and re-evaluation of patient's condition.  This document was prepared using Dragon Voice Recognition software and may include unintentional dictation errors.  Dr. Tobie Poet [facility]  If 7PM-7AM, please contact overnight-coverage provider If 7AM-7PM, please contact day coverage provider [email or web address]  12/22/2022, 7:21 PM

## 2022-12-22 NOTE — ED Notes (Signed)
Patient provided with clear liquids per request

## 2022-12-22 NOTE — Assessment & Plan Note (Signed)
-   Check a UA, procalcitonin, portable chest x-ray

## 2022-12-22 NOTE — Assessment & Plan Note (Addendum)
And right lower quadrant tenderness - CT abdomen/pel was deferred on admission due to acute kidney injury

## 2022-12-22 NOTE — Assessment & Plan Note (Signed)
-   With patient endorsing 1 episode of coffee-ground emesis and bright red blood per rectum - Protonix bolus and gtt. has been initiated - Gastroenterology has been consulted via secure chat - Clear liquid now and n.p.o. after midnight - Repeat CBC in a.m.

## 2022-12-22 NOTE — ED Triage Notes (Signed)
Pt here via [facility] with rectal bleeding x4 days. Pt states he also has RLQ abd pain that is getting worse. Pt denies clots but says blood is bright red in the toilet.

## 2022-12-22 NOTE — ED Provider Notes (Signed)
[facility] Provider Note   Event Date/Time   First MD Initiated Contact with Patient 12/22/22 1524     (approximate) History  Rectal Bleeding  HPI Miguel [name] is a 62 y.o. male with past medical history of alcohol abuse, hypertension, major depressive disorder, gout, and COPD who presents for rectal bleeding for the last 4 days as well as persistent right lower and right upper quadrant abdominal pain that has been present for the last few months.  Patient states that this bleeding has been constant and every bowel movement over the last 4 days.  Patient describes this right-sided abdominal pain as in the right upper quadrant that radiates down to the right lower quadrant as well as around to the back.  Patient states that he has been taking Aleve every day for the past few weeks due to this right upper quadrant abdominal pain. ROS: Patient currently denies any vision changes, tinnitus, difficulty speaking, facial droop, sore throat, chest pain, shortness of breath, nausea/vomiting/diarrhea, dysuria, or weakness/numbness/paresthesias in any extremity   Physical Exam  Triage Vital Signs: ED Triage Vitals [12/22/22 1251]  Enc Vitals Group     BP 95/67     Pulse Rate 79     Resp 20     Temp 98.3 F (36.8 C)     Temp Source Oral     SpO2 96 %     Weight 190 lb 14.7 oz (86.6 kg)     Height 6' (1.829 m)     Head Circumference      Peak Flow      Pain Score 9     Pain Loc      Pain Edu?      Excl. in [place]?    Most recent vital signs: Vitals:   12/22/22 1522 12/22/22 1630  BP: 100/70 114/73  Pulse: 69 70  Resp: 18 14  Temp: 98.2 F (36.8 C)   SpO2: 98% 100%   General: Awake, oriented x4. CV:  Good peripheral perfusion.  Resp:  Normal effort.  Abd:  No distention.  Mild right upper and lower quadrant abdominal pain without rebound or guarding Other:  Middle-aged overweight Caucasian male laying in bed in no acute distress.  Gross red blood in bowel  movement ED Results / Procedures / Treatments  Labs (all labs ordered are listed, but only abnormal results are displayed) Labs Reviewed  CBC - Abnormal; Notable for the following components:      Result Value   WBC 12.0 (*)    All other components within normal limits  COMPREHENSIVE METABOLIC PANEL - Abnormal; Notable for the following components:   Sodium 132 (*)    Potassium >7.5 (*)    CO2 21 (*)    Glucose, Bld 109 (*)    BUN 34 (*)    Creatinine, Ser 1.69 (*)    GFR, Estimated 45 (*)    All other components within normal limits  BASIC METABOLIC PANEL - Abnormal; Notable for the following components:   Sodium 132 (*)    Potassium 6.4 (*)    CO2 18 (*)    Glucose, Bld 111 (*)    BUN 35 (*)    Creatinine, Ser 1.87 (*)    GFR, Estimated 40 (*)    All other components within normal limits  POC OCCULT BLOOD, ED - Abnormal  ETHANOL  URINALYSIS, ROUTINE W REFLEX MICROSCOPIC  URINE DRUG SCREEN, QUALITATIVE ([facility] ONLY)  PHOSPHORUS  MAGNESIUM  BASIC  METABOLIC PANEL  CBC  PROCALCITONIN   EKG ED ECG REPORT I, Naaman Plummer, the attending physician, personally viewed and interpreted this ECG. Date: 12/22/2022 EKG Time: 1459 Rate: 107 Rhythm: Tachycardic sinus rhythm QRS Axis: normal Intervals: normal ST/T Wave abnormalities: normal Narrative Interpretation: Tachycardic sinus rhythm.  No evidence of acute ischemia RADIOLOGY ED MD interpretation: Portable chest x-ray interpreted by me shows no evidence of acute abnormalities including no pneumonia, pneumothorax, or widened mediastinum.  There is incidentally found low lung volumes with streaky peribronchial airspace opacities -Agree with radiology assessment Official radiology report(s): DG Chest Port 1 View  Result Date: 12/22/2022 CLINICAL DATA:  Dyspnea on exertion. EXAM: PORTABLE CHEST 1 VIEW COMPARISON:  October 11, 2021 FINDINGS: Cardiomediastinal silhouette is normal. Mediastinal contours appear intact. Low lung  volumes with streaky peribronchial airspace opacities which may represent consolidation or atelectasis. Osseous structures are without acute abnormality. Soft tissues are grossly normal. IMPRESSION: Low lung volumes with streaky peribronchial airspace opacities which may represent consolidation or atelectasis. Electronically Signed   By: Fidela Salisbury M.D.   On: 12/22/2022 18:16   PROCEDURES: Critical Care performed: Yes, see critical care procedure note(s) .1-3 Lead EKG Interpretation  Performed by: Naaman Plummer, MD Authorized by: Naaman Plummer, MD     Interpretation: normal     ECG rate:  71   ECG rate assessment: normal     Rhythm: sinus rhythm     Ectopy: none     Conduction: normal   CRITICAL CARE Performed by: Naaman Plummer  Total critical care time: 33 minutes  Critical care time was exclusive of separately billable procedures and treating other patients.  Critical care was necessary to treat or prevent imminent or life-threatening deterioration.  Critical care was time spent personally by me on the following activities: development of treatment plan with patient and/or surrogate as well as nursing, discussions with consultants, evaluation of patient's response to treatment, examination of patient, obtaining history from patient or surrogate, ordering and performing treatments and interventions, ordering and review of laboratory studies, ordering and review of radiographic studies, pulse oximetry and re-evaluation of patient's condition.  MEDICATIONS ORDERED IN ED: Medications  sodium zirconium cyclosilicate (LOKELMA) packet 10 g (10 g Oral Given 12/22/22 1656)  LORazepam (ATIVAN) tablet 0.5 mg (has no administration in time range)  thiamine (VITAMIN B1) tablet 100 mg (100 mg Oral Given 12/22/22 1809)  ascorbic acid (VITAMIN C) tablet 500 mg (500 mg Oral Given 12/22/22 1814)  albuterol (PROVENTIL) (2.5 MG/3ML) 0.083% nebulizer solution 2.5 mg (has no administration in time  range)  folic acid (FOLVITE) tablet 1 mg (1 mg Oral Given 12/22/22 1810)  acetaminophen (TYLENOL) tablet 650 mg (650 mg Oral Given 12/22/22 1810)    Or  acetaminophen (TYLENOL) suppository 650 mg ( Rectal See Alternative 12/22/22 1810)  ondansetron (ZOFRAN) tablet 4 mg (has no administration in time range)    Or  ondansetron (ZOFRAN) injection 4 mg (has no administration in time range)  senna-docusate (Senokot-S) tablet 1 tablet (has no administration in time range)  LORazepam (ATIVAN) injection 1 mg (has no administration in time range)  hydrALAZINE (APRESOLINE) injection 5 mg (has no administration in time range)  0.9 %  sodium chloride infusion ( Intravenous New Bag/Given 12/22/22 1809)  pantoprazole (PROTONIX) 80 mg /NS 100 mL IVPB (has no administration in time range)  pantoprozole (PROTONIX) 80 mg /NS 100 mL infusion (has no administration in time range)  pantoprazole (PROTONIX) injection 40 mg (has no administration  in time range)  sodium chloride 0.9 % bolus 1,000 mL (0 mLs Intravenous Stopped 12/22/22 1751)  calcium gluconate 1 g/ 50 mL sodium chloride IVPB (0 mg Intravenous Stopped 12/22/22 1642)  insulin aspart (novoLOG) injection 10 Units (10 Units Intravenous Given 12/22/22 1656)  dextrose 50 % solution 50 mL (50 mLs Intravenous Given 12/22/22 1656)   IMPRESSION / MDM / ASSESSMENT AND PLAN / ED COURSE  I reviewed the triage vital signs and the nursing notes.                             The patient is on the cardiac monitor to evaluate for evidence of arrhythmia and/or significant heart rate changes. Patient's presentation is most consistent with acute presentation with potential threat to life or bodily function. Patient presents for 4 days of rectal bleeding as well as a right upper and lower quadrant abdominal pain for the last few months.  Patient however does show evidence of severe hyperkalemia at 6.5 without significant EKG changes Workup: CBC, BMP, ECG Findings: ECG: shows no  concerning T wave morphology, interval prolongation or other findings concerning for severe potassium related changes. Interventions:  Calcium Gluconate 63m of a 10%  solution over 10 mins with close hemodynamic monitoring for potential osmotic shift. Insulin 10 units (regular) IV with 1 amp D50  Not secondary to crush or thermal injury. Doubt TLS or DKA. Patient does have gross red blood in his stool however at this time I and not as concerned for this bright red blood coming from an upper GI standpoint as patient has normal hemoglobin and hematocrit at 13.5/42.9.  Type and screen sent.  Dispo: Admit to medicine   FINAL CLINICAL IMPRESSION(S) / ED DIAGNOSES   Final diagnoses:  Hyperkalemia  Rectal bleeding  AKI (acute kidney injury) ([place]   Rx / DC Orders   ED Discharge Orders     None      Note:  This document was prepared using Dragon voice recognition software and may include unintentional dictation errors.   BNaaman Plummer MD 12/22/22 [phone]

## 2022-12-22 NOTE — Telephone Encounter (Signed)
Miguel [name] called to request a refill of hydroxyzine (ATARAX) 25 MG and stated that he was very anxious, shaking, and was unable to drive. Miguel [name] denied any SI/HI. He explained that he has been dripping blood from his bottom and is worried that he may have something wrong with him. Clinician explained that since the he has Medicaid and is no longer a patient of the [facility] the providers here could not write prescription refills. Clinician confirmed that Miguel [name] received referrals to [facility] and had a home visit with "Mateo Flow" from [place]. Further, clinician provided the contact information for [facility] crisis line and [facility] line. Clinician explained that she was not a medical provider and could not offer advice on his bleeding, but  he should reach out to his new primary care provider with his concerns or go to the nearest emergency department. Miguel [name] stated he understood and would contact the [facility] crisis line today and go to the emergency department for care.

## 2022-12-22 NOTE — Hospital Course (Addendum)
[name] is a 62 year old male with history of alcohol abuse, chronic right upper quadrant abdominal pain, hypertension, GERD, who presents to the emergency department for chief concerns of right upper quadrant abdominal pain, rectal bleeding, nausea and weakness.  Initial vitals in the ED showed temperature of 98.3, respiration rate of 20, heart rate of 79, blood pressure 95/67, SpO2 of 96% on room air.  Serum sodium 132, potassium 6.4, chloride 106, bicarb 18, BUN of 35, serum creatinine of 1.87, EGFR 40, nonfasting blood glucose 111, WBC 12, hemoglobin 13.5, platelets of 284.  ED treatment: Aspart 10 units, Lokelma, D50 (1 ampule), calcium gluconate 1 g IV, sodium chloride 1 L bolus

## 2022-12-22 NOTE — Assessment & Plan Note (Addendum)
-   Resumed home Ativan 0.5 mg nightly as needed for anxiety - Ativan 0.5 mg IV every 4 hours as needed for seizure and anxiety not relieved with home p.o. Ativan, 3 doses ordered

## 2022-12-22 NOTE — ED Notes (Signed)
This float RN notified MD and primary RN of patient's potassium. New line started that repeat CMP was drawn from. Pt on monitor. IVF and calcium gluconate infusing.

## 2022-12-23 ENCOUNTER — Inpatient Hospital Stay: Payer: Medicaid Other

## 2022-12-23 ENCOUNTER — Encounter: Payer: Self-pay | Admitting: Internal Medicine

## 2022-12-23 DIAGNOSIS — F1994 Other psychoactive substance use, unspecified with psychoactive substance-induced mood disorder: Secondary | ICD-10-CM

## 2022-12-23 DIAGNOSIS — R10811 Right upper quadrant abdominal tenderness: Secondary | ICD-10-CM

## 2022-12-23 DIAGNOSIS — D62 Acute posthemorrhagic anemia: Secondary | ICD-10-CM

## 2022-12-23 DIAGNOSIS — K219 Gastro-esophageal reflux disease without esophagitis: Secondary | ICD-10-CM | POA: Diagnosis not present

## 2022-12-23 DIAGNOSIS — F4010 Social phobia, unspecified: Secondary | ICD-10-CM

## 2022-12-23 DIAGNOSIS — K922 Gastrointestinal hemorrhage, unspecified: Secondary | ICD-10-CM | POA: Diagnosis not present

## 2022-12-23 DIAGNOSIS — F332 Major depressive disorder, recurrent severe without psychotic features: Secondary | ICD-10-CM

## 2022-12-23 DIAGNOSIS — I158 Other secondary hypertension: Secondary | ICD-10-CM

## 2022-12-23 DIAGNOSIS — D72825 Bandemia: Secondary | ICD-10-CM

## 2022-12-23 DIAGNOSIS — N179 Acute kidney failure, unspecified: Secondary | ICD-10-CM

## 2022-12-23 DIAGNOSIS — E875 Hyperkalemia: Secondary | ICD-10-CM | POA: Diagnosis not present

## 2022-12-23 LAB — CBC
HCT: 34.1 % — ABNORMAL LOW (ref 39.0–52.0)
Hemoglobin: 10.8 g/dL — ABNORMAL LOW (ref 13.0–17.0)
MCH: 28.1 pg (ref 26.0–34.0)
MCHC: 31.7 g/dL (ref 30.0–36.0)
MCV: 88.8 fL (ref 80.0–100.0)
Platelets: 217 10*3/uL (ref 150–400)
RBC: 3.84 MIL/uL — ABNORMAL LOW (ref 4.22–5.81)
RDW: 14.9 % (ref 11.5–15.5)
WBC: 7.2 10*3/uL (ref 4.0–10.5)
nRBC: 0 % (ref 0.0–0.2)

## 2022-12-23 LAB — BASIC METABOLIC PANEL
Anion gap: 6 (ref 5–15)
BUN: 27 mg/dL — ABNORMAL HIGH (ref 8–23)
CO2: 20 mmol/L — ABNORMAL LOW (ref 22–32)
Calcium: 8.1 mg/dL — ABNORMAL LOW (ref 8.9–10.3)
Chloride: 109 mmol/L (ref 98–111)
Creatinine, Ser: 1.54 mg/dL — ABNORMAL HIGH (ref 0.61–1.24)
GFR, Estimated: 50 mL/min — ABNORMAL LOW (ref >60)
Glucose, Bld: 100 mg/dL — ABNORMAL HIGH (ref 70–99)
Potassium: 4.6 mmol/L (ref 3.5–5.1)
Sodium: 135 mmol/L (ref 135–145)

## 2022-12-23 LAB — PHOSPHORUS: Phosphorus: 4.7 mg/dL — ABNORMAL HIGH (ref 2.5–4.6)

## 2022-12-23 LAB — MAGNESIUM: Magnesium: 2 mg/dL (ref 1.7–2.4)

## 2022-12-23 MED ORDER — LEVALBUTEROL HCL 0.63 MG/3ML IN NEBU: 0.6300 mg | INHALATION_SOLUTION | Freq: Four times a day (QID) | RESPIRATORY_TRACT | Status: DC | PRN

## 2022-12-23 MED ORDER — HYDROMORPHONE HCL 1 MG/ML IJ SOLN
0.5000 mg | INTRAMUSCULAR | Status: DC | PRN
  Administered 2022-12-23 (×2): 0.5 mg via INTRAVENOUS
  Filled 2022-12-23: qty 0.5
  Filled 2022-12-23: qty 1

## 2022-12-23 MED ORDER — PEG 3350-KCL-NA BICARB-NACL 420 G PO SOLR
4000.0000 mL | Freq: Once | ORAL | Status: AC
  Administered 2022-12-23: 4000 mL via ORAL
  Filled 2022-12-23: qty 4000

## 2022-12-23 MED ORDER — CLONAZEPAM 0.5 MG PO TABS
0.5000 mg | ORAL_TABLET | Freq: Every day | ORAL | Status: DC
  Administered 2022-12-23: 0.5 mg via ORAL
  Filled 2022-12-23: qty 1

## 2022-12-23 MED ORDER — MOMETASONE FURO-FORMOTEROL FUM 200-5 MCG/ACT IN AERO
2.0000 | INHALATION_SPRAY | Freq: Two times a day (BID) | RESPIRATORY_TRACT | Status: DC
  Administered 2022-12-23 – 2022-12-25 (×4): 2 via RESPIRATORY_TRACT
  Filled 2022-12-23: qty 8.8

## 2022-12-23 MED ORDER — IOHEXOL 300 MG/ML  SOLN
100.0000 mL | Freq: Once | INTRAMUSCULAR | Status: AC | PRN
  Administered 2022-12-23: 100 mL via INTRAVENOUS

## 2022-12-23 MED ORDER — BUSPIRONE HCL 10 MG PO TABS
5.0000 mg | ORAL_TABLET | Freq: Two times a day (BID) | ORAL | Status: DC
  Administered 2022-12-23 (×2): 5 mg via ORAL
  Filled 2022-12-23 (×2): qty 1

## 2022-12-23 MED ORDER — IPRATROPIUM BROMIDE 0.02 % IN SOLN: 0.5000 mg | Freq: Four times a day (QID) | RESPIRATORY_TRACT | Status: DC | PRN

## 2022-12-23 NOTE — ED Notes (Signed)
Pt independently ambulatory to restroom.

## 2022-12-23 NOTE — ED Notes (Signed)
Pt on call light, this RN to bedside. Pt crying, endorsing sudden R sided abdominal/flank pain. Pt stating pain feels similar to before he came in but is way worse. Pt ambulatory to bathroom to attempt BM. Multiple episodes of dry heaving heard from nurses station. Pt ambulatory back to room, unable to have BM and no emesis at this time, pt shaky. Provider notified.

## 2022-12-23 NOTE — ED Notes (Signed)
Pt ambulatory back to bed independently and without difficulty.

## 2022-12-23 NOTE — Consult Note (Signed)
Consultation  Referring Provider:     Dr Damita Dunnings Admit date 12/22/22 Consult date        12/23/22 Reason for Consultation:     hematemesis/rectal bleeding         HPI:   Miguel [name] is a 62 y.o. male with history of alcohol abuse, chronic right upper quadrant abdominal pain, hypertension, GERD, who presents to the emergency department for chief concerns of right upper quadrant abdominal pain, rectal bleeding, nausea and weakness.  He was noted to use frequent Aleve and  found to acute injury and metabolic derangements as well, was subsequently admitted and GI consulted Hgb today 10.8, normal platelets and normal wbc. GFR now 56 K+ normal as is Mg++. UDS yesterday + for cannabis and tca's.hgn yesterday 13.5. liver enzymes have been normal. States he  has chronic ruq pain- started seeing some intermittent brpr about a couple months which increase 4-5d ago associated with more ruq pain and nausea- started vomiting some last week and yesterday- food stuffs a couple times and some coffee ground emesis.  Last hematemesis day before yesterday. Has had some bad heartburn and reflux over the last few days despite omeprazole . Several stools- diarrheal in nature but denies melena.states he has been sober for 14m  Has had some mild weight gain since he quit etoh. Has had some pill dysphagia but not with solid foods. Feels better when he is sleeping. Endorses some occasional cannabis use in the evening. Was using at least 2 aleve most days for his abdominal pain. Has no further gi concerns presently. Denies family history of colorectal cancer, colon polyps, liver disease, PUD.  Last bloody stool last night. He has been started on pantorpazole gtt and b vitamins.  No known problems with sedated procedures Has had some telephone counselling for his alcoholism and has found this helpful.  PREVIOUS ENDOSCOPIES:             Colonoscopy 05/19/20- Dr AVicente Males history of adenomatous polyps-  diverticulosis and 9 polyps- 4  adfenomas rest hyperplastic CCY 2015 Colonoscopy 2015- Dr WAllen Norris 2 large polyps- adenomas and  hyper plastic polyps EGD 2015- full report not available. There was no h pyori, dysplaisa, malignancy, celiac.  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    OCD (obsessive compulsive disorder)    Renal colic     Past Surgical History:  Procedure Laterality Date   CHOLECYSTECTOMY  2012   COLONOSCOPY     COLONOSCOPY WITH PROPOFOL N/A 05/28/2020   Procedure: COLONOSCOPY WITH PROPOFOL;  Surgeon: AJonathon Bellows MD;  [facility];  Service: Gastroenterology;  Laterality: N/A;   EXTRACORPOREAL SHOCK WAVE LITHOTRIPSY Left 01/12/2019   Procedure: EXTRACORPOREAL SHOCK WAVE LITHOTRIPSY (ESWL);  Surgeon: SBilley Co MD;  Location: [facility] ORS;  Service: Urology;  Laterality: Left;   UPPER GI ENDOSCOPY      Family History  Problem Relation Age of Onset   Alcohol abuse Father    Breast cancer Mother 791  Anxiety disorder Brother    Other Maternal Grandmother        unknown medical history   Other Maternal Grandfather  unknow medical history   Other Paternal Grandmother        unknown medical history   Other Paternal Grandfather        unknown medical history   Healthy Brother      Social History   Tobacco Use   Smoking status: Former    Types: Cigarettes    Quit date: 03/19/1981    Years since quitting: 41.7   Smokeless tobacco: Never  Vaping Use   Vaping Use: Never used  Substance Use Topics   Alcohol use: Not Currently    Alcohol/week: 24.0 standard drinks of alcohol    Types: 24 Shots of liquor per week    Comment: last used 09/2021   Drug use: Not Currently    Types: Marijuana    Comment: last use 09/2021     Prior to Admission medications   Medication Sig Start Date End Date Taking? Authorizing Provider  albuterol (PROAIR HFA) 108 (90 Base) MCG/ACT inhaler Inhale 2 puffs into the lungs once every 4 (four) hours as needed. 03/04/22  Yes Iloabachie, Chioma E, NP  ascorbic acid (VITAMIN C) 500 MG tablet Take by mouth. 08/01/19  Yes [provider]  fluticasone furoate-vilanterol (BREO ELLIPTA) 200-25 MCG/ACT AEPB Inhale one puff by mouth daily 04/30/22  Yes Iloabachie, Chioma E, NP  hydrOXYzine (ATARAX) 25 MG tablet Take 1 tablet (25 mg total) by mouth 3 (three) times daily as needed for anxiety. 10/27/22  Yes Iloabachie, Chioma E, NP  lisinopril (ZESTRIL) 40 MG tablet TAKE ONE TABLET BY MOUTH ONCE EVERY DAY. 10/27/22  Yes Iloabachie, Chioma E, NP  LORazepam (ATIVAN) 0.5 MG tablet Take 1 tablet (0.5 mg total) by mouth at bedtime as needed for anxiety. 12/07/22 12/07/23 Yes Lavonia Drafts, MD  metoprolol tartrate (LOPRESSOR) 25 MG tablet Take 1 tablet (25 mg total) by mouth once daily. 10/27/22  Yes Iloabachie, Chioma E, NP  omeprazole (PRILOSEC) 20 MG capsule Take 20 mg by mouth daily.   Yes [provider]  folic acid (FOLVITE) 1 MG tablet TAKE ONE TABLET BY MOUTH ONCE EVERY DAY. Patient not taking: Reported on 12/22/2022 08/04/22   Iloabachie, Chioma E, NP  thiamine (VITAMIN B1) 100 MG tablet Take 1 tablet (100 mg total) by mouth daily. Patient not taking: Reported on 12/22/2022 08/04/22 08/04/23  Caryl Asp E, NP    Current Facility-Administered Medications  Medication Dose Route Frequency Provider Last Rate Last Admin   0.9 %  sodium chloride infusion   Intravenous Continuous Cox, Amy N, DO 125 mL/hr at 12/23/22 0354 Rate Verify at 12/23/22 0354   acetaminophen (TYLENOL) tablet 650 mg  650 mg Oral Q6H PRN Cox, Amy N, DO   650 mg at 12/22/22 1810   Or   acetaminophen (TYLENOL) suppository 650 mg  650 mg Rectal Q6H PRN Cox, Amy N, DO       albuterol (PROVENTIL) (2.5 MG/3ML)  0.083% nebulizer solution 2.5 mg  2.5 mg Inhalation Q6H PRN Cox, Amy N, DO       ascorbic acid (VITAMIN C) tablet 500 mg  500 mg Oral Daily Cox, Amy N, DO   500 mg at 48/25/00 3704   folic acid (FOLVITE) tablet 1 mg  1 mg Oral Daily Cox, Amy N, DO   1 mg at 12/22/22 1810   hydrALAZINE (APRESOLINE) injection 5 mg  5 mg Intravenous Q6H PRN Cox, Amy N, DO       LORazepam (ATIVAN) injection 0.5 mg  0.5 mg Intravenous Q4H PRN Cox,  Amy N, DO       LORazepam (ATIVAN) tablet 0.5 mg  0.5 mg Oral QHS PRN Cox, Amy N, DO   0.5 mg at 12/23/22 0057   ondansetron (ZOFRAN) tablet 4 mg  4 mg Oral Q6H PRN Cox, Amy N, DO       Or   ondansetron (ZOFRAN) injection 4 mg  4 mg Intravenous Q6H PRN Cox, Amy N, DO       [START ON 12/26/2022] pantoprazole (PROTONIX) injection 40 mg  40 mg Intravenous Q12H Cox, Amy N, DO       pantoprozole (PROTONIX) 80 mg /NS 100 mL infusion  8 mg/hr Intravenous Continuous Cox, Amy N, DO 10 mL/hr at 12/23/22 0727 8 mg/hr at 12/23/22 0727   senna-docusate (Senokot-S) tablet 1 tablet  1 tablet Oral QHS PRN Cox, Amy N, DO       sodium zirconium cyclosilicate (LOKELMA) packet 10 g  10 g Oral BID Cox, Amy N, DO   10 g at 12/22/22 1656   thiamine (VITAMIN B1) tablet 100 mg  100 mg Oral Daily Cox, Amy N, DO   100 mg at 12/22/22 4098   Current Outpatient Medications  Medication Sig Dispense Refill   albuterol (PROAIR HFA) 108 (90 Base) MCG/ACT inhaler Inhale 2 puffs into the lungs once every 4 (four) hours as needed. 17 g 1   ascorbic acid (VITAMIN C) 500 MG tablet Take by mouth.     fluticasone furoate-vilanterol (BREO ELLIPTA) 200-25 MCG/ACT AEPB Inhale one puff by mouth daily 180 each 3   hydrOXYzine (ATARAX) 25 MG tablet Take 1 tablet (25 mg total) by mouth 3 (three) times daily as needed for anxiety. 90 tablet 0   lisinopril (ZESTRIL) 40 MG tablet TAKE ONE TABLET BY MOUTH ONCE EVERY DAY. 90 tablet 0   LORazepam (ATIVAN) 0.5 MG tablet Take 1 tablet (0.5 mg total) by mouth at bedtime as needed  for anxiety. 20 tablet 0   metoprolol tartrate (LOPRESSOR) 25 MG tablet Take 1 tablet (25 mg total) by mouth once daily. 90 tablet 0   omeprazole (PRILOSEC) 20 MG capsule Take 20 mg by mouth daily.     folic acid (FOLVITE) 1 MG tablet TAKE ONE TABLET BY MOUTH ONCE EVERY DAY. (Patient not taking: Reported on 12/22/2022) 30 tablet 1   thiamine (VITAMIN B1) 100 MG tablet Take 1 tablet (100 mg total) by mouth daily. (Patient not taking: Reported on 12/22/2022) 90 tablet 0    Allergies as of 12/22/2022   (No Known Allergies)     Review of Systems:    All systems reviewed and negative except where noted in HPI.      Physical Exam:  Vital signs in last 24 hours: Temp:  [97.7 F (36.5 C)-98.3 F (36.8 C)] 98.1 F (36.7 C) (02/07 0644) Pulse Rate:  [68-79] 77 (02/07 0630) Resp:  [12-20] 14 (02/07 0630) BP: (95-132)/(61-84) 114/75 (02/07 0630) SpO2:  [92 %-100 %] 98 % (02/07 0630) Weight:  [86.6 kg] 86.6 kg (02/06 1251)   General:   Pleasant man  in NAD Head:  Normocephalic and atraumatic. Eyes:   No icterus.   Conjunctiva pink. Ears:  Normal auditory acuity. Mouth: Mucosa pink moist, no lesions. Neck:  Supple; no masses felt Lungs:  Respirations even and unlabored. Lungs clear to auscultation bilaterally.   No wheezes, crackles, or rhonchi.  Heart:  S1S2, RRR, no MRG. No edema. Abdomen:   Flat, soft, nondistended, nontender. Normal bowel sounds. No appreciable masses or  hepatomegaly. No rebound signs or other peritoneal signs. Rectal:  Not performed.  Msk:  MAEW x4, No clubbing or cyanosis. Strength 5/5. Symmetrical without gross deformities. Neurologic:  Alert and  oriented x4;  Cranial nerves II-XII intact.  Skin:  Warm, dry, pink without significant lesions or rashes. Psych:  Alert and cooperative. Normal affect.  LAB RESULTS: Recent Labs    12/22/22 1253 12/23/22 0525  WBC 12.0* 7.2  HGB 13.5 10.8*  HCT 42.9 34.1*  PLT 284 217   BMET Recent Labs    12/22/22 1354  12/22/22 1528 12/23/22 0525  NA 132* 132* 135  K >7.5* 6.4* 4.6  CL 104 106 109  CO2 21* 18* 20*  GLUCOSE 109* 111* 100*  BUN 34* 35* 27*  CREATININE 1.69* 1.87* 1.54*  CALCIUM 9.5 8.9 8.1*   LFT Recent Labs    12/22/22 1354  PROT 8.0  ALBUMIN 4.3  AST 16  ALT 17  ALKPHOS 92  BILITOT 0.8   PT/INR No results for input(s): "LABPROT", "INR" in the last 72 hours.  STUDIES: DG Chest Port 1 View  Result Date: 12/22/2022 CLINICAL DATA:  Dyspnea on exertion. EXAM: PORTABLE CHEST 1 VIEW COMPARISON:  October 11, 2021 FINDINGS: Cardiomediastinal silhouette is normal. Mediastinal contours appear intact. Low lung volumes with streaky peribronchial airspace opacities which may represent consolidation or atelectasis. Osseous structures are without acute abnormality. Soft tissues are grossly normal. IMPRESSION: Low lung volumes with streaky peribronchial airspace opacities which may represent consolidation or atelectasis. Electronically Signed   By: Fidela Salisbury M.D.   On: 12/22/2022 18:16       Impression / Plan:   Acute on chronic ruq pain, bloody diarrhea. Hematemesis/dyspepsia- electrolytes and kidney function better today. Agree with pantoprazole, following hgn and transfusing to keep hbg >7. Counselled on nsaid side effects and recommended to avoid. Recommending egd/colonoscopy for luminal evaluation. Indications, risks, benefits were discussed with him and he is agreeable to them. May need further imaging. Alcoholism- congratulated him on his abstention and encouraged him to continue  Thank you very much for this consult. These services were provided by Stephens November, NP-C, in collaboration with Lollie Sails, MD, with whom I have discussed this patient in full.   Stephens November, NP-C

## 2022-12-23 NOTE — Consult Note (Signed)
[facility]  CONSULT NOTE    Date: 12/23/2022                  Patient Name:  Miguel [name]  MRN: [id number]  DOB: [birth year]  Age / Sex: 62 y.o., male         PCP: Pcp, No                 Service Requesting Consult: [place]                 Reason for Consult: Acute kidney injury            History of Present Illness: Miguel [name] is a 62 y.o.  male with past medical history concerning for hypertension, GERD, asthma, renal calculi and alcohol abuse, who was admitted to [facility] on 12/22/2022 for Hyperkalemia [E87.5] Acute GI bleeding [K92.2]  Patient presents to the ED complaining of RUQ abdominal pain and rectal bleeding. States he has had this abdominal pain for a few months. Last colonoscopy was in 2021. Reports NSAID use, 1 tablet of Aleve daily for abd pain. States this did not relieve his pain. He reports small amounts of blood with bowel movements for about a week. Nausea and vomiting reported during initial ED encounter, coffee brown emesis.   Labs on ED arrival include sodium 132, potssium >7.5, serum bicarb 21, glucose, 109, BUN 34, creatinine 1.69 wit GFR 45. Chest xray concerning for consolidation or atelectasis. Potassium treated in ED and patient placed on Lokelma.    Medications: Outpatient medications: (Not in a hospital admission)   Current medications: Current Facility-Administered Medications  Medication Dose Route Frequency Provider Last Rate Last Admin   0.9 %  sodium chloride infusion   Intravenous Continuous Wendee Beavers T, MD 100 mL/hr at 12/23/22 1443 Rate Change at 12/23/22 1443   acetaminophen (TYLENOL) tablet 650 mg  650 mg Oral Q6H PRN Cox, Amy N, DO   650 mg at 12/22/22 1810   Or   acetaminophen (TYLENOL) suppository 650 mg  650 mg Rectal Q6H PRN Cox, Amy N, DO       albuterol (PROVENTIL) (2.5 MG/3ML) 0.083% nebulizer solution 2.5 mg  2.5 mg Inhalation Q6H PRN Cox, Amy N, DO       ascorbic acid (VITAMIN C) tablet 500 mg  500 mg Oral  Daily Cox, Amy N, DO   500 mg at 12/23/22 2992   busPIRone (BUSPAR) tablet 5 mg  5 mg Oral BID Wendee Beavers T, MD   5 mg at 12/23/22 1110   clonazePAM (KLONOPIN) tablet 0.5 mg  0.5 mg Oral QHS Mercy Riding, MD       folic acid (FOLVITE) tablet 1 mg  1 mg Oral Daily Cox, Amy N, DO   1 mg at 12/23/22 4268   hydrALAZINE (APRESOLINE) injection 5 mg  5 mg Intravenous Q6H PRN Cox, Amy N, DO       ondansetron (ZOFRAN) tablet 4 mg  4 mg Oral Q6H PRN Cox, Amy N, DO       Or   ondansetron (ZOFRAN) injection 4 mg  4 mg Intravenous Q6H PRN Cox, Amy N, DO       [START ON 12/26/2022] pantoprazole (PROTONIX) injection 40 mg  40 mg Intravenous Q12H Cox, Amy N, DO       pantoprozole (PROTONIX) 80 mg /NS 100 mL infusion  8 mg/hr Intravenous Continuous Cox, Amy N, DO 10 mL/hr at 12/23/22 0727 8 mg/hr  at 12/23/22 0727   polyethylene glycol-electrolytes (NuLYTELY) solution 4,000 mL  4,000 mL Oral Once Ok Edwards, NP       senna-docusate (Senokot-S) tablet 1 tablet  1 tablet Oral QHS PRN Cox, Amy N, DO       thiamine (VITAMIN B1) tablet 100 mg  100 mg Oral Daily Cox, Amy N, DO   100 mg at 12/23/22 0932   Current Outpatient Medications  Medication Sig Dispense Refill   albuterol (PROAIR HFA) 108 (90 Base) MCG/ACT inhaler Inhale 2 puffs into the lungs once every 4 (four) hours as needed. 17 g 1   ascorbic acid (VITAMIN C) 500 MG tablet Take by mouth.     fluticasone furoate-vilanterol (BREO ELLIPTA) 200-25 MCG/ACT AEPB Inhale one puff by mouth daily 180 each 3   hydrOXYzine (ATARAX) 25 MG tablet Take 1 tablet (25 mg total) by mouth 3 (three) times daily as needed for anxiety. 90 tablet 0   lisinopril (ZESTRIL) 40 MG tablet TAKE ONE TABLET BY MOUTH ONCE EVERY DAY. 90 tablet 0   LORazepam (ATIVAN) 0.5 MG tablet Take 1 tablet (0.5 mg total) by mouth at bedtime as needed for anxiety. 20 tablet 0   metoprolol tartrate (LOPRESSOR) 25 MG tablet Take 1 tablet (25 mg total) by mouth once daily. 90 tablet 0    omeprazole (PRILOSEC) 20 MG capsule Take 20 mg by mouth daily.     folic acid (FOLVITE) 1 MG tablet TAKE ONE TABLET BY MOUTH ONCE EVERY DAY. (Patient not taking: Reported on 12/22/2022) 30 tablet 1   thiamine (VITAMIN B1) 100 MG tablet Take 1 tablet (100 mg total) by mouth daily. 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    OCD (obsessive compulsive disorder)    Renal colic      Past Surgical History: Past Surgical History:  Procedure Laterality Date   CHOLECYSTECTOMY  2012   COLONOSCOPY     COLONOSCOPY WITH PROPOFOL N/A 05/28/2020   Procedure: COLONOSCOPY WITH PROPOFOL;  Surgeon: [name], MD;  Location: [facility] ENDOSCOPY;  Service: Gastroenterology;  Laterality: N/A;   EXTRACORPOREAL SHOCK WAVE LITHOTRIPSY Left 01/12/2019   Procedure: EXTRACORPOREAL SHOCK WAVE LITHOTRIPSY (ESWL);  Surgeon: Billey Co, MD;  Location: [facility] ORS;  Service: Urology;  Laterality: Left;   UPPER GI ENDOSCOPY       Family History: Family History  Problem Relation Age of Onset   Alcohol abuse Father    Breast cancer Mother 49   Anxiety disorder Brother    Other Maternal Grandmother        unknown medical history   Other Maternal Grandfather        unknow medical history   Other Paternal Grandmother        unknown medical history   Other Paternal Grandfather        unknown medical history   Healthy Brother      Social History: Social History   Socioeconomic History   Marital status: Married    Spouse name:  Not on file   Number of children: Not on file   Years of education: Not on file   Highest education level: Not on  file  Occupational History   Not on file  Tobacco Use   Smoking status: Former    Types: Cigarettes    Quit date: 03/19/1981    Years since quitting: 41.7   Smokeless tobacco: Never  Vaping Use   Vaping Use: Never used  Substance and Sexual Activity   Alcohol use: Not Currently    Alcohol/week: 24.0 standard drinks of alcohol    Types: 24 Shots of liquor per week    Comment: last used 09/2021   Drug use: Not Currently    Types: Marijuana    Comment: last use 09/2021   Sexual activity: Not Currently  Other Topics Concern   Not on file  Social History Narrative   Lives at home with his wife and takes care of his wife. Independent at baseline      - Biggest strain is financial but doesn't think they could got get more help.      Patient expressed interest in possible financial assistance. Informed written consent obtained   Social Determinants of Health   Financial Resource Strain: High Risk (09/18/2020)   Overall Financial Resource Strain (CARDIA)    Difficulty of Paying Living Expenses: Very hard  Food Insecurity: No Food Insecurity (12/23/2022)   Hunger Vital Sign    Worried About Running Out of Food in the Last Year: Never true    Ran Out of Food in the Last Year: Never true  Transportation Needs: No Transportation Needs (12/23/2022)   PRAPARE - Hydrologist (Medical): No    Lack of Transportation (Non-Medical): No  Physical Activity: Sufficiently Active (09/18/2020)   Exercise Vital Sign    Days of Exercise per Week: 7 days    Minutes of Exercise per Session: 60 min  Stress: Stress Concern Present (09/18/2020)   [place]    Feeling of Stress : Very much  Social Connections: Socially Isolated (09/18/2020)   Social Connection and Isolation Panel [NHANES]    Frequency of Communication with Friends and Family: Once a week    Frequency of Social Gatherings with Friends and Family:  Once a week    Attends Religious Services: Never    Marine scientist or Organizations: No    Attends Archivist Meetings: Never    Marital Status: Married  Human resources officer Violence: Not At Risk (12/23/2022)   Humiliation, Afraid, Rape, and Kick questionnaire    Fear of Current or Ex-Partner: No    Emotionally Abused: No    Physically Abused: No    Sexually Abused: No     Review of Systems: Review of Systems  Constitutional:  Negative for chills, fever and malaise/fatigue.  HENT:  Negative for congestion, sore throat and tinnitus.   Eyes:  Negative for blurred vision and redness.  Respiratory:  Negative for cough, shortness of breath and wheezing.   Cardiovascular:  Negative for chest pain, palpitations, claudication and leg swelling.  Gastrointestinal:  Positive for abdominal pain, blood in stool and nausea. Negative for diarrhea and vomiting.  Genitourinary:  Negative for flank pain, frequency and hematuria.  Musculoskeletal:  Negative for back pain, falls and myalgias.  Skin:  Negative for rash.  Neurological:  Negative for dizziness, weakness and headaches.  Endo/Heme/Allergies:  Does not bruise/bleed easily.  Psychiatric/Behavioral:  Negative for depression. The patient is not nervous/anxious and does not have insomnia.     Vital Signs: Blood pressure 115/73, pulse 75, temperature 98 F (36.7 C), temperature source Oral, resp. rate 15, height 6' (1.829 m), weight 86.6 kg, SpO2 97 %.  Weight trends: Filed Weights   12/22/22 1251  Weight: 86.6 kg    Physical Exam: General: NAD  Head: Normocephalic, atraumatic. Dry oral mucosal membranes  Eyes: Anicteric  Lungs:  Clear to auscultation, normal effort  Heart: Regular rate and rhythm  Abdomen:  Soft, nontender  Extremities:  No peripheral edema.  Neurologic: Nonfocal, moving all four extremities  Skin: No lesions  Access: None     Lab results: Basic Metabolic Panel: Recent Labs  Lab 12/22/22 1354  12/22/22 1528 12/22/22 1811 12/23/22 0525  NA 132* 132*  --  135  K >7.5* 6.4*  --  4.6  CL 104 106  --  109  CO2 21* 18*  --  20*  GLUCOSE 109* 111*  --  100*  BUN 34* 35*  --  27*  CREATININE 1.69* 1.87*  --  1.54*  CALCIUM 9.5 8.9  --  8.1*  MG  --   --  1.5* 2.0  PHOS  --   --  2.2* 4.7*    Liver Function Tests: Recent Labs  Lab 12/22/22 1354  AST 16  ALT 17  ALKPHOS 92  BILITOT 0.8  PROT 8.0  ALBUMIN 4.3   No results for input(s): "LIPASE", "AMYLASE" in the last 168 hours. No results for input(s): "AMMONIA" in the last 168 hours.  CBC: Recent Labs  Lab 12/22/22 1253 12/23/22 0525  WBC 12.0* 7.2  HGB 13.5 10.8*  HCT 42.9 34.1*  MCV 88.3 88.8  PLT 284 217    Cardiac Enzymes: No results for input(s): "CKTOTAL", "CKMB", "CKMBINDEX", "TROPONINI" in the last 168 hours.  BNP: Invalid input(s): "POCBNP"  CBG: No results for input(s): "GLUCAP" in the last 168 hours.  Microbiology: Results for orders placed or performed during the hospital encounter of 09/05/21  Resp Panel by RT-PCR (Flu A&B, Covid) Nasopharyngeal Swab     Status: None   Collection Time: 09/05/21  4:33 AM   Specimen: Nasopharyngeal Swab; Nasopharyngeal(NP) swabs in vial transport medium  Result Value Ref Range Status   SARS Coronavirus 2 by RT PCR NEGATIVE NEGATIVE Final    Comment: (NOTE) SARS-CoV-2 target nucleic acids are NOT DETECTED.  The SARS-CoV-2 RNA is generally detectable in upper respiratory specimens during the acute phase of infection. The lowest concentration of SARS-CoV-2 viral copies this assay can detect is 138 copies/mL. A negative result does not preclude SARS-Cov-2 infection and should not be used as the sole basis for treatment or other patient management decisions. A negative result may occur with  improper specimen collection/handling, submission of specimen other than nasopharyngeal swab, presence of viral mutation(s) within the areas targeted by this assay, and  inadequate number of viral copies(<138 copies/mL). A negative result must be combined with clinical observations, patient history, and epidemiological information. The expected result is Negative.  Fact Sheet for Patients:  EntrepreneurPulse.com.au  Fact Sheet for Healthcare Providers:  IncredibleEmployment.be  This test is no t yet approved or cleared by the [place] FDA and  has been authorized for detection and/or diagnosis of SARS-CoV-2 by FDA under an Emergency Use Authorization (EUA). This EUA will remain  in effect (meaning this test can be used) for the duration of the COVID-19 declaration under Section 564(b)(1) of  the Act, 21 U.S.C.section 360bbb-3(b)(1), unless the authorization is terminated  or revoked sooner.       Influenza A by PCR NEGATIVE NEGATIVE Final   Influenza B by PCR NEGATIVE NEGATIVE Final    Comment: (NOTE) The Xpert Xpress SARS-CoV-2/FLU/RSV plus assay is intended as an aid in the diagnosis of influenza from Nasopharyngeal swab specimens and should not be used as a sole basis for treatment. Nasal washings and aspirates are unacceptable for Xpert Xpress SARS-CoV-2/FLU/RSV testing.  Fact Sheet for Patients: EntrepreneurPulse.com.au  Fact Sheet for Healthcare Providers: IncredibleEmployment.be  This test is not yet approved or cleared by the [place] FDA and has been authorized for detection and/or diagnosis of SARS-CoV-2 by FDA under an Emergency Use Authorization (EUA). This EUA will remain in effect (meaning this test can be used) for the duration of the COVID-19 declaration under Section 564(b)(1) of the Act, 21 U.S.C. section 360bbb-3(b)(1), unless the authorization is terminated or revoked.  Performed at [facility], [address] [place], [place] [address]     Coagulation Studies: No results for input(s): "LABPROT", "INR" in the last 72  hours.  Urinalysis: Recent Labs    12/22/22 2222  COLORURINE YELLOW*  LABSPEC 1.009  PHURINE 5.0  GLUCOSEU NEGATIVE  HGBUR NEGATIVE  BILIRUBINUR NEGATIVE  KETONESUR NEGATIVE  PROTEINUR NEGATIVE  NITRITE NEGATIVE  LEUKOCYTESUR NEGATIVE      Imaging: DG Chest Port 1 View  Result Date: 12/22/2022 CLINICAL DATA:  Dyspnea on exertion. EXAM: PORTABLE CHEST 1 VIEW COMPARISON:  October 11, 2021 FINDINGS: Cardiomediastinal silhouette is normal. Mediastinal contours appear intact. Low lung volumes with streaky peribronchial airspace opacities which may represent consolidation or atelectasis. Osseous structures are without acute abnormality. Soft tissues are grossly normal. IMPRESSION: Low lung volumes with streaky peribronchial airspace opacities which may represent consolidation or atelectasis. Electronically Signed   By: Fidela Salisbury M.D.   On: 12/22/2022 18:16     Assessment & Plan: Mr. MONTEE TALLMAN is a 62 y.o.  male with past medical history concerning for hypertension, GERD, asthma, renal calculi and alcohol abuse, who was admitted to [facility] on 12/22/2022 for Hyperkalemia [E87.5] Acute GI bleeding [K92.2]  Acute kidney injury likely secondary to hypovolemia and NSAID use. Normal renal function noted in January 2024. Prolonged poor oral intake with episodes of diarrhea and vomiting. No IV contrast exposure. Agree with IVF. No acute need for dialysis at this time but will monitor. Avoid nephrotoxic agents and therapies, including hypotension.   2. Anemia of acute blood loss, suspected Lab Results  Component Value Date   HGB 10.8 (L) 12/23/2022    Patient reported bloody stools with coffee ground emesis. GI consulted and recommending EGD/colonoscopy.   3. Hyperkalemia/hyponatremia. Potassium greater then 7.5 on admission. Treated with Lokelma in ED.     LOS: 1   2/7/20242:49 PM

## 2022-12-23 NOTE — Progress Notes (Signed)
PROGRESS NOTE  Miguel [name] ENI:[id number] DOB: 01/17/1961   PCP: Pcp, No  Patient is from: Home  DOA: 12/22/2022 LOS: 1  Chief complaints Chief Complaint  Patient presents with   Rectal Bleeding     Brief Narrative / Interim history: 62 year old M with PMH of chronic RUQ abdominal pain, HTN, GERD, anxiety and former alcohol abuse currently sober presenting with hematochezia, hematemesis and generalized weakness and admitted for GI bleed, AKI, hyperkalemia.  Patient reports daily NSAID use for his chronic pain.  Vital stable.  Hgb 13.5 (baseline).  Cr 1.87 (baseline 1.1).  BUN 35. K > 7.5.  UDS positive for marijuana and tricyclic's.  Patient was given IV insulin, calcium gluconate, sodium bicarb, fluid bolus and Lokelma.  GI and nephrology consulted.  The next day, bleeding subsided.  Hgb down by 3 g.  Hyperkalemia resolved.  AKI improved.  Continues to endorse RUQ abdominal tenderness.  CT abdomen and pelvis ordered.  Evaluated by GI.  Plan for EGD and colonoscopy.    Subjective: Seen and examined earlier this morning.  No major events overnight of this morning.  Has not had bloody bowel movements since early last night.  No nausea, vomiting or hematemesis either.  Denies chest pain or shortness of breath.  Chronic right-sided abdominal pain  Objective: Vitals:   12/23/22 0644 12/23/22 1030 12/23/22 1058 12/23/22 1400  BP:  127/78  115/73  Pulse:  77  75  Resp:  12  15  Temp: 98.1 F (36.7 C)  98 F (36.7 C)   TempSrc: Oral  Oral   SpO2:  100%  97%  Weight:      Height:        Examination:  GENERAL: No apparent distress.  Nontoxic. HEENT: MMM.  Vision and hearing grossly intact.  NECK: Supple.  No apparent JVD.  RESP:  No IWOB.  Fair aeration bilaterally. CVS:  RRR. Heart sounds normal.  ABD/GI/GU: BS+. Abd soft, NTND.  MSK/EXT:  Moves extremities. No apparent deformity. No edema.  SKIN: no apparent skin lesion or wound NEURO: Awake, alert and oriented appropriately.   No apparent focal neuro deficit. PSYCH: Calm. Normal affect.   Procedures:  None  Microbiology summarized: None  Assessment and plan: Principal Problem:   Hyperkalemia Active Problems:   AKI (acute kidney injury) ([place])   Hypertension   Substance induced mood disorder (HCC)   Severe recurrent major 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 (HCC)   GERD (gastroesophageal reflux disease)   Social anxiety disorder   Leukocytosis   GI bleed   RUQ abdominal tenderness   Acute GI bleeding  Acute blood loss anemia in the setting of acute GI bleed with hematemesis and hematochezia: Hgb dropped about 3 g although part of this is from hemodilution in the setting of IV fluid.  No further bleeding last night or this morning.  Patient reports taking Aleve 1 tablet daily for chronic pain. Recent Labs    12/07/22 1505 12/22/22 1253 12/23/22 0525  HGB 13.5 13.5 10.8*  -Advised to refrain from NSAID. -Monitor CBC daily. -Continue PPI -Check anemia panel -GI following-plan for endoscopy and colonoscopy tomorrow  AKI: Likely due to NSAID and ACE inhibitor's.  Improving. Recent Labs    12/07/22 1505 12/22/22 1354 12/22/22 1528 12/23/22 0525  BUN 18 34* 35* 27*  CREATININE 1.16 1.69* 1.87* 1.54*  -Advised to refrain from NSAID -Continue holding lisinopril -Continue IV fluid  Hyperkalemia/hypomagnesemia: In the setting of AKI.  Resolved.  RUQ abdominal tenderness: Unclear etiology  of this.  It is episodic.  Very severe this afternoon. -CT abdomen and pelvis -Plan for endoscopy -IV Dilaudid as needed   Chronic COPD: Stable. Ruthe Mannan instead of home Advair. -Xopenex and Atrovent as needed.  Reports getting jittery with albuterol.   General anxiety disorder: Reports taking Ativan 0.5 mg at night at home. -Will try Klonopin 0.5 mg at night.  Less withdrawal symptoms. -Started BuSpar 5 mg twice daily after discussion with patient  Essential hypertension: Normotensive. -Hold home  lisinopril in the setting of AKI -IV hydralazine as needed  GERD -PPI  Leukocytosis: Resolved.  Body mass index is 25.89 kg/m.           DVT prophylaxis:  Place TED hose Start: 12/22/22 1728  Code Status: Full code Family Communication: None at bedside Level of care: Med-Surg Status is: Inpatient Remains inpatient appropriate because: Acute GI bleed and AKI   Final disposition: TBD Consultants:  Nephrology Gastroenterology  55 minutes with more than 50% spent in reviewing records, counseling patient/family and coordinating care.   Sch Meds:  Scheduled Meds:  ascorbic acid  500 mg Oral Daily   busPIRone  5 mg Oral BID   clonazePAM  0.5 mg Oral QHS   folic acid  1 mg Oral Daily   [START ON 12/26/2022] pantoprazole  40 mg Intravenous Q12H   polyethylene glycol-electrolytes  4,000 mL Oral Once   thiamine  100 mg Oral Daily   Continuous Infusions:  sodium chloride 100 mL/hr at 12/23/22 1443   pantoprazole 8 mg/hr (12/23/22 0727)   PRN Meds:.acetaminophen **OR** acetaminophen, albuterol, hydrALAZINE, ondansetron **OR** ondansetron (ZOFRAN) IV, senna-docusate  Antimicrobials: Anti-infectives (From admission, onward)    None        I have personally reviewed the following labs and images: CBC: Recent Labs  Lab 12/22/22 1253 12/23/22 0525  WBC 12.0* 7.2  HGB 13.5 10.8*  HCT 42.9 34.1*  MCV 88.3 88.8  PLT 284 217   BMP &GFR Recent Labs  Lab 12/22/22 1354 12/22/22 1528 12/22/22 1811 12/23/22 0525  NA 132* 132*  --  135  K >7.5* 6.4*  --  4.6  CL 104 106  --  109  CO2 21* 18*  --  20*  GLUCOSE 109* 111*  --  100*  BUN 34* 35*  --  27*  CREATININE 1.69* 1.87*  --  1.54*  CALCIUM 9.5 8.9  --  8.1*  MG  --   --  1.5* 2.0  PHOS  --   --  2.2* 4.7*   Estimated Creatinine Clearance: 53.9 mL/min (A) (by C-G formula based on SCr of 1.54 mg/dL (H)). Liver & Pancreas: Recent Labs  Lab 12/22/22 1354  AST 16  ALT 17  ALKPHOS 92  BILITOT 0.8   PROT 8.0  ALBUMIN 4.3   No results for input(s): "LIPASE", "AMYLASE" in the last 168 hours. No results for input(s): "AMMONIA" in the last 168 hours. Diabetic: No results for input(s): "HGBA1C" in the last 72 hours. No results for input(s): "GLUCAP" in the last 168 hours. Cardiac Enzymes: No results for input(s): "CKTOTAL", "CKMB", "CKMBINDEX", "TROPONINI" in the last 168 hours. No results for input(s): "PROBNP" in the last 8760 hours. Coagulation Profile: No results for input(s): "INR", "PROTIME" in the last 168 hours. Thyroid Function Tests: No results for input(s): "TSH", "T4TOTAL", "FREET4", "T3FREE", "THYROIDAB" in the last 72 hours. Lipid Profile: No results for input(s): "CHOL", "HDL", "LDLCALC", "TRIG", "CHOLHDL", "LDLDIRECT" in the last 72 hours. Anemia Panel: No results  for input(s): "VITAMINB12", "FOLATE", "FERRITIN", "TIBC", "IRON", "RETICCTPCT" in the last 72 hours. Urine analysis:    Component Value Date/Time   COLORURINE YELLOW (A) 12/22/2022 2222   APPEARANCEUR CLEAR (A) 12/22/2022 2222   APPEARANCEUR Hazy 06/26/2014 2238   LABSPEC 1.009 12/22/2022 2222   LABSPEC 1.036 06/26/2014 2238   PHURINE 5.0 12/22/2022 2222   GLUCOSEU NEGATIVE 12/22/2022 2222   GLUCOSEU Negative 06/26/2014 2238   HGBUR NEGATIVE 12/22/2022 2222   BILIRUBINUR NEGATIVE 12/22/2022 2222   BILIRUBINUR 1+ 06/26/2014 2238   KETONESUR NEGATIVE 12/22/2022 2222   PROTEINUR NEGATIVE 12/22/2022 2222   NITRITE NEGATIVE 12/22/2022 2222   LEUKOCYTESUR NEGATIVE 12/22/2022 2222   LEUKOCYTESUR 1+ 06/26/2014 2238   Sepsis Labs: Invalid input(s): "PROCALCITONIN", "LACTICIDVEN"  Microbiology: No results found for this or any previous visit (from the past 240 hour(s)).  Radiology Studies: DG Chest Port 1 View  Result Date: 12/22/2022 CLINICAL DATA:  Dyspnea on exertion. EXAM: PORTABLE CHEST 1 VIEW COMPARISON:  October 11, 2021 FINDINGS: Cardiomediastinal silhouette is normal. Mediastinal contours  appear intact. Low lung volumes with streaky peribronchial airspace opacities which may represent consolidation or atelectasis. Osseous structures are without acute abnormality. Soft tissues are grossly normal. IMPRESSION: Low lung volumes with streaky peribronchial airspace opacities which may represent consolidation or atelectasis. Electronically Signed   By: Fidela Salisbury M.D.   On: 12/22/2022 18:16      Wrenn Willcox T. [place]  If 7PM-7AM, please contact night-coverage [email or web address] 12/23/2022, 3:19 PM

## 2022-12-23 NOTE — ED Notes (Signed)
Informed RN bed assigned 

## 2022-12-24 ENCOUNTER — Inpatient Hospital Stay: Payer: Medicaid Other | Admitting: Anesthesiology

## 2022-12-24 ENCOUNTER — Encounter
Admission: EM | Disposition: A | Discharge status: Home / Self Care | Payer: Self-pay | Source: Home / Self Care | Attending: Student

## 2022-12-24 ENCOUNTER — Encounter: Payer: Self-pay | Admitting: Student

## 2022-12-24 ENCOUNTER — Telehealth: Payer: Self-pay

## 2022-12-24 DIAGNOSIS — N179 Acute kidney failure, unspecified: Secondary | ICD-10-CM | POA: Diagnosis not present

## 2022-12-24 DIAGNOSIS — E875 Hyperkalemia: Secondary | ICD-10-CM | POA: Diagnosis not present

## 2022-12-24 DIAGNOSIS — K219 Gastro-esophageal reflux disease without esophagitis: Secondary | ICD-10-CM | POA: Diagnosis not present

## 2022-12-24 DIAGNOSIS — K922 Gastrointestinal hemorrhage, unspecified: Secondary | ICD-10-CM | POA: Diagnosis not present

## 2022-12-24 HISTORY — PX: COLONOSCOPY: SHX5424

## 2022-12-24 HISTORY — PX: ESOPHAGOGASTRODUODENOSCOPY: SHX5428

## 2022-12-24 LAB — RETICULOCYTES
Immature Retic Fract: 13.4 % (ref 2.3–15.9)
RBC.: 3.9 MIL/uL — ABNORMAL LOW (ref 4.22–5.81)
Retic Count, Absolute: 51.1 10*3/uL (ref 19.0–186.0)
Retic Ct Pct: 1.3 % (ref 0.4–3.1)

## 2022-12-24 LAB — IRON AND TIBC
Iron: 85 ug/dL (ref 45–182)
Saturation Ratios: 26 % (ref 17.9–39.5)
TIBC: 333 ug/dL (ref 250–450)
UIBC: 248 ug/dL

## 2022-12-24 LAB — COMPREHENSIVE METABOLIC PANEL
ALT: 14 U/L (ref 0–44)
AST: 15 U/L (ref 15–41)
Albumin: 3.5 g/dL (ref 3.5–5.0)
Alkaline Phosphatase: 72 U/L (ref 38–126)
Anion gap: 5 (ref 5–15)
BUN: 14 mg/dL (ref 8–23)
CO2: 20 mmol/L — ABNORMAL LOW (ref 22–32)
Calcium: 8.3 mg/dL — ABNORMAL LOW (ref 8.9–10.3)
Chloride: 112 mmol/L — ABNORMAL HIGH (ref 98–111)
Creatinine, Ser: 1.18 mg/dL (ref 0.61–1.24)
GFR, Estimated: >60 mL/min (ref >60)
Glucose, Bld: 107 mg/dL — ABNORMAL HIGH (ref 70–99)
Potassium: 4.4 mmol/L (ref 3.5–5.1)
Sodium: 137 mmol/L (ref 135–145)
Total Bilirubin: 0.7 mg/dL (ref 0.3–1.2)
Total Protein: 6.2 g/dL — ABNORMAL LOW (ref 6.5–8.1)

## 2022-12-24 LAB — VITAMIN B12: Vitamin B-12: 186 pg/mL (ref 180–914)

## 2022-12-24 LAB — CBC WITH DIFFERENTIAL/PLATELET
Abs Immature Granulocytes: 0.05 10*3/uL (ref 0.00–0.07)
Basophils Absolute: 0.1 10*3/uL (ref 0.0–0.1)
Basophils Relative: 1 %
Eosinophils Absolute: 0.1 10*3/uL (ref 0.0–0.5)
Eosinophils Relative: 1 %
HCT: 37.4 % — ABNORMAL LOW (ref 39.0–52.0)
Hemoglobin: 11.9 g/dL — ABNORMAL LOW (ref 13.0–17.0)
Immature Granulocytes: 1 %
Lymphocytes Relative: 19 %
Lymphs Abs: 1.2 10*3/uL (ref 0.7–4.0)
MCH: 28.2 pg (ref 26.0–34.0)
MCHC: 31.8 g/dL (ref 30.0–36.0)
MCV: 88.6 fL (ref 80.0–100.0)
Monocytes Absolute: 0.8 10*3/uL (ref 0.1–1.0)
Monocytes Relative: 12 %
Neutro Abs: 4.3 10*3/uL (ref 1.7–7.7)
Neutrophils Relative %: 66 %
Platelets: 221 10*3/uL (ref 150–400)
RBC: 4.22 MIL/uL (ref 4.22–5.81)
RDW: 15.1 % (ref 11.5–15.5)
WBC: 6.5 10*3/uL (ref 4.0–10.5)
nRBC: 0 % (ref 0.0–0.2)

## 2022-12-24 LAB — MAGNESIUM: Magnesium: 1.8 mg/dL (ref 1.7–2.4)

## 2022-12-24 LAB — LIPASE, BLOOD: Lipase: 34 U/L (ref 11–51)

## 2022-12-24 LAB — FERRITIN: Ferritin: 70 ng/mL (ref 24–336)

## 2022-12-24 LAB — FOLATE: Folate: >40 ng/mL (ref >5.9)

## 2022-12-24 SURGERY — EGD (ESOPHAGOGASTRODUODENOSCOPY)
Anesthesia: General

## 2022-12-24 MED ORDER — PROPOFOL 500 MG/50ML IV EMUL
INTRAVENOUS | Status: DC | PRN
  Administered 2022-12-24: 150 ug/kg/min via INTRAVENOUS

## 2022-12-24 MED ORDER — BUSPIRONE HCL 10 MG PO TABS
5.0000 mg | ORAL_TABLET | Freq: Three times a day (TID) | ORAL | Status: DC
  Administered 2022-12-24 – 2022-12-25 (×3): 5 mg via ORAL
  Filled 2022-12-24 (×3): qty 1

## 2022-12-24 MED ORDER — LORAZEPAM 2 MG/ML IJ SOLN
0.5000 mg | Freq: Four times a day (QID) | INTRAMUSCULAR | Status: DC | PRN
  Administered 2022-12-24 (×2): 0.5 mg via INTRAVENOUS
  Filled 2022-12-24 (×2): qty 1

## 2022-12-24 MED ORDER — PHENYLEPHRINE 80 MCG/ML (10ML) SYRINGE FOR IV PUSH (FOR BLOOD PRESSURE SUPPORT)
PREFILLED_SYRINGE | INTRAVENOUS | Status: AC
  Filled 2022-12-24: qty 10

## 2022-12-24 MED ORDER — PROPOFOL 10 MG/ML IV BOLUS
INTRAVENOUS | Status: DC | PRN
  Administered 2022-12-24: 30 mg via INTRAVENOUS
  Administered 2022-12-24: 20 mg via INTRAVENOUS
  Administered 2022-12-24: 100 mg via INTRAVENOUS

## 2022-12-24 MED ORDER — VITAMIN B-12 1000 MCG PO TABS
1000.0000 ug | ORAL_TABLET | Freq: Every day | ORAL | Status: DC
  Administered 2022-12-25: 1000 ug via ORAL
  Filled 2022-12-24: qty 1

## 2022-12-24 MED ORDER — CYANOCOBALAMIN 1000 MCG/ML IJ SOLN
1000.0000 ug | Freq: Once | INTRAMUSCULAR | Status: AC
  Administered 2022-12-24: 1000 ug via INTRAMUSCULAR
  Filled 2022-12-24: qty 1

## 2022-12-24 MED ORDER — LIDOCAINE HCL (CARDIAC) PF 100 MG/5ML IV SOSY
PREFILLED_SYRINGE | INTRAVENOUS | Status: DC | PRN
  Administered 2022-12-24: 40 mg via INTRAVENOUS

## 2022-12-24 MED ORDER — DEXMEDETOMIDINE HCL IN NACL 200 MCG/50ML IV SOLN
INTRAVENOUS | Status: DC | PRN
  Administered 2022-12-24: 4 ug via INTRAVENOUS
  Administered 2022-12-24 (×2): 8 ug via INTRAVENOUS

## 2022-12-24 MED ORDER — SODIUM CHLORIDE 0.9 % IV SOLN: INTRAVENOUS | Status: DC

## 2022-12-24 MED ORDER — PROPOFOL 10 MG/ML IV BOLUS
INTRAVENOUS | Status: AC
  Filled 2022-12-24: qty 20

## 2022-12-24 MED ORDER — SODIUM CHLORIDE 0.9 % IV SOLN: INTRAVENOUS | Status: DC | PRN

## 2022-12-24 NOTE — Anesthesia Postprocedure Evaluation (Signed)
Anesthesia Post Note  Patient: Miguel [name]  Procedure(s) Performed: ESOPHAGOGASTRODUODENOSCOPY (EGD) COLONOSCOPY  Patient location during evaluation: Endoscopy Anesthesia Type: General Level of consciousness: awake and alert Pain management: pain level controlled Vital Signs Assessment: post-procedure vital signs reviewed and stable Respiratory status: spontaneous breathing, nonlabored ventilation, respiratory function stable and patient connected to nasal cannula oxygen Cardiovascular status: blood pressure returned to baseline and stable Postop Assessment: no apparent nausea or vomiting Anesthetic complications: no  No notable events documented.   Last Vitals:  Vitals:   12/24/22 1714 12/24/22 1806  BP: 132/83 (!) 145/90  Pulse: 79 72  Resp: (!) 22 18  Temp:  36.9 C  SpO2: 99% 100%    Last Pain:  Vitals:   12/24/22 1806  TempSrc: Oral  PainSc:                  Dimas Millin

## 2022-12-24 NOTE — Progress Notes (Signed)
PROGRESS NOTE  Miguel [name] OEU:[id number] DOB: 10-02-1961   PCP: Pcp, No  Patient is from: Home  DOA: 12/22/2022 LOS: 2  Chief complaints Chief Complaint  Patient presents with   Rectal Bleeding     Brief Narrative / Interim history: 62 year old M with PMH of chronic RUQ abdominal pain, HTN, GERD, anxiety and former alcohol abuse currently sober presenting with hematochezia, hematemesis and generalized weakness and admitted for GI bleed, AKI, hyperkalemia.  Patient reports daily NSAID use for his chronic pain.  Vital stable.  Hgb 13.5 (baseline).  Cr 1.87 (baseline 1.1).  BUN 35. K > 7.5.  UDS positive for marijuana and tricyclic's.  Patient was given IV insulin, calcium gluconate, sodium bicarb, fluid bolus and Lokelma.  GI and nephrology consulted.  The next day, bleeding subsided.  Hgb down by 3 g.  Hyperkalemia resolved.  AKI improved.  Continues to endorse RUQ abdominal tenderness.  CT abdomen and pelvis without significant finding to explain patient's symptoms but prominent submucosal fat deposition most evident at the right colon consistent with chronic inflammatory process.   Plan for EGD and colonoscopy.  AKI and hyperkalemia resolved.   Subjective: Seen and examined earlier this morning.  No major events overnight of this morning.  Continues to endorse pain in right flank and right back.  Pain is worse with movement.  Denies nausea, vomiting, further bleeding or UTI symptoms.  Very anxious.  Objective: Vitals:   12/23/22 1724 12/23/22 2111 12/24/22 0419 12/24/22 0819  BP: (!) 147/88 (!) 161/96 (!) 153/87 (!) 153/90  Pulse: 79 85 94 75  Resp: '19 18 19 16  '$ Temp: 98.1 F (36.7 C) 98.1 F (36.7 C) 98 F (36.7 C) 98.1 F (36.7 C)  TempSrc:    Oral  SpO2: 100% 100% 98% 99%  Weight:      Height:        Examination:  GENERAL: No apparent distress.  Nontoxic. HEENT: MMM.  Vision and hearing grossly intact.  NECK: Supple.  No apparent JVD.  RESP:  No IWOB.  Fair aeration  bilaterally. CVS:  RRR. Heart sounds normal.  ABD/GI/GU: BS+. Abd soft, NTND.  MSK/EXT:   No apparent deformity. Moves extremities.  Mild mild TTP across lumbar paraspinous muscles SKIN: no apparent skin lesion or wound NEURO: Awake and alert. Oriented appropriately.  No apparent focal neuro deficit. PSYCH: Appears anxious.  Procedures:  None  Microbiology summarized: None  Assessment and plan: Principal Problem:   Hyperkalemia Active Problems:   AKI (acute kidney injury) ([place])   Hypertension   Substance induced mood disorder (HCC)   Severe recurrent major 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 (HCC)   GERD (gastroesophageal reflux disease)   Social anxiety disorder   Leukocytosis   GI bleed   RUQ abdominal tenderness   Acute GI bleeding  Acute blood loss anemia in the setting of acute GI bleed with hematemesis and hematochezia: Hgb improved after initial drop.  No further bleeding last night or this morning.  Patient reports taking Aleve 1 tablet daily for chronic pain.  No iron deficiency anemia. Recent Labs    12/07/22 1505 12/22/22 1253 12/23/22 0525 12/24/22 0311  HGB 13.5 13.5 10.8* 11.9*  -Advised to refrain from NSAID. -Monitor CBC daily. -Continue PPI -GI following-plan for endoscopy and colonoscopy  AKI: Likely due to NSAID and ACE inhibitor's.  AKI resolved. Recent Labs    12/07/22 1505 12/22/22 1354 12/22/22 1528 12/23/22 0525 12/24/22 0311  BUN 18 34* 35* 27* 14  CREATININE 1.16  1.69* 1.87* 1.54* 1.18  -Advised to refrain from NSAID -Continue holding lisinopril -Continue IV fluid  Hyperkalemia/hypomagnesemia: In the setting of AKI.  Resolved.  Right back and abdominal pain: Unclear etiology but looks MSK and/or psychogenic.  CT abdomen and pelvis unrevealing. -Follow EGD/colonoscopy -As needed pain meds.   Chronic COPD: Stable. Ruthe Mannan instead of home Advair. -Xopenex and Atrovent as needed.  Reports getting jittery with albuterol.    General anxiety disorder: Reports taking Ativan 0.5 mg at night at home.  Very anxious.  Denies drinking alcohol in over a year. -IV Ativan every 6 hours as needed -Increase BuSpar to 5 mg every 8 hours.  Essential hypertension: BP slightly elevated probably from anxiety and not taking medications. -Hold home lisinopril in the setting of AKI -IV hydralazine as needed  GERD -PPI  Vitamin B12 deficiency: 186. -Vitamin B12 injection followed by p.o.  Leukocytosis: Resolved.  Body mass index is 25.89 kg/m.           DVT prophylaxis:  Place TED hose Start: 12/22/22 1728  Code Status: Full code Family Communication: None at bedside Level of care: Med-Surg Status is: Inpatient Remains inpatient appropriate because: Acute GI bleed and AKI   Final disposition: TBD Consultants:  Nephrology Gastroenterology  35 minutes with more than 50% spent in reviewing records, counseling patient/family and coordinating care.   Sch Meds:  Scheduled Meds:  ascorbic acid  500 mg Oral Daily   busPIRone  5 mg Oral TID   folic acid  1 mg Oral Daily   mometasone-formoterol  2 puff Inhalation BID   [START ON 12/26/2022] pantoprazole  40 mg Intravenous Q12H   thiamine  100 mg Oral Daily   Continuous Infusions:  pantoprazole 8 mg/hr (12/24/22 0839)   PRN Meds:.acetaminophen **OR** acetaminophen, hydrALAZINE, HYDROmorphone (DILAUDID) injection, levalbuterol **AND** ipratropium, LORazepam, ondansetron **OR** ondansetron (ZOFRAN) IV, senna-docusate  Antimicrobials: Anti-infectives (From admission, onward)    None        I have personally reviewed the following labs and images: CBC: Recent Labs  Lab 12/22/22 1253 12/23/22 0525 12/24/22 0311  WBC 12.0* 7.2 6.5  NEUTROABS  --   --  4.3  HGB 13.5 10.8* 11.9*  HCT 42.9 34.1* 37.4*  MCV 88.3 88.8 88.6  PLT 284 217 221   BMP &GFR Recent Labs  Lab 12/22/22 1354 12/22/22 1528 12/22/22 1811 12/23/22 0525 12/24/22 0311  NA  132* 132*  --  135 137  K >7.5* 6.4*  --  4.6 4.4  CL 104 106  --  109 112*  CO2 21* 18*  --  20* 20*  GLUCOSE 109* 111*  --  100* 107*  BUN 34* 35*  --  27* 14  CREATININE 1.69* 1.87*  --  1.54* 1.18  CALCIUM 9.5 8.9  --  8.1* 8.3*  MG  --   --  1.5* 2.0 1.8  PHOS  --   --  2.2* 4.7*  --    Estimated Creatinine Clearance: 70.3 mL/min (by C-G formula based on SCr of 1.18 mg/dL). Liver & Pancreas: Recent Labs  Lab 12/22/22 1354 12/24/22 0311  AST 16 15  ALT 17 14  ALKPHOS 92 72  BILITOT 0.8 0.7  PROT 8.0 6.2*  ALBUMIN 4.3 3.5   Recent Labs  Lab 12/24/22 0311  LIPASE 34   No results for input(s): "AMMONIA" in the last 168 hours. Diabetic: No results for input(s): "HGBA1C" in the last 72 hours. No results for input(s): "GLUCAP" in the last 168  hours. Cardiac Enzymes: No results for input(s): "CKTOTAL", "CKMB", "CKMBINDEX", "TROPONINI" in the last 168 hours. No results for input(s): "PROBNP" in the last 8760 hours. Coagulation Profile: No results for input(s): "INR", "PROTIME" in the last 168 hours. Thyroid Function Tests: No results for input(s): "TSH", "T4TOTAL", "FREET4", "T3FREE", "THYROIDAB" in the last 72 hours. Lipid Profile: No results for input(s): "CHOL", "HDL", "LDLCALC", "TRIG", "CHOLHDL", "LDLDIRECT" in the last 72 hours. Anemia Panel: Recent Labs    12/24/22 0311  VITAMINB12 186  FOLATE >40.0  FERRITIN 70  TIBC 333  IRON 85  RETICCTPCT 1.3   Urine analysis:    Component Value Date/Time   COLORURINE YELLOW (A) 12/22/2022 2222   APPEARANCEUR CLEAR (A) 12/22/2022 2222   APPEARANCEUR Hazy 06/26/2014 2238   LABSPEC 1.009 12/22/2022 2222   LABSPEC 1.036 06/26/2014 2238   PHURINE 5.0 12/22/2022 2222   GLUCOSEU NEGATIVE 12/22/2022 2222   GLUCOSEU Negative 06/26/2014 2238   HGBUR NEGATIVE 12/22/2022 2222   BILIRUBINUR NEGATIVE 12/22/2022 2222   BILIRUBINUR 1+ 06/26/2014 2238   KETONESUR NEGATIVE 12/22/2022 2222   PROTEINUR NEGATIVE 12/22/2022 2222    NITRITE NEGATIVE 12/22/2022 2222   LEUKOCYTESUR NEGATIVE 12/22/2022 2222   LEUKOCYTESUR 1+ 06/26/2014 2238   Sepsis Labs: Invalid input(s): "PROCALCITONIN", "LACTICIDVEN"  Microbiology: No results found for this or any previous visit (from the past 240 hour(s)).  Radiology Studies: CT ABDOMEN PELVIS W CONTRAST  Result Date: 12/23/2022 CLINICAL DATA:  Right upper quadrant pain and rectal bleeding EXAM: CT ABDOMEN AND PELVIS WITH CONTRAST TECHNIQUE: Multidetector CT imaging of the abdomen and pelvis was performed using the standard protocol following bolus administration of intravenous contrast. RADIATION DOSE REDUCTION: This exam was performed according to the departmental dose-optimization program which includes automated exposure control, adjustment of the mA and/or kV according to patient size and/or use of iterative reconstruction technique. CONTRAST:  169m OMNIPAQUE IOHEXOL 300 MG/ML  SOLN COMPARISON:  CT 10/12/2021 FINDINGS: Lower chest: Lung bases are clear. Hepatobiliary: No focal liver abnormality is seen. Status post cholecystectomy. No biliary dilatation. Pancreas: Unremarkable. No pancreatic ductal dilatation or surrounding inflammatory changes. Spleen: Normal in size without focal abnormality. Adrenals/Urinary Tract: Adrenal glands are normal. No hydronephrosis. Small nonobstructing bilateral kidney stones, the largest is on the right and measures up to 7 mm. The bladder is unremarkable. Stomach/Bowel: The stomach is nonenlarged. No dilated small bowel. No acute bowel wall thickening. Negative appendix. Prominent submucosal fat deposition most evident at the right colon consistent with chronic inflammatory process. Diverticular disease of left colon. Vascular/Lymphatic: Mild aortic atherosclerosis. No aneurysm. No suspicious lymph nodes Reproductive: Prostate is unremarkable. Other: Negative for pelvic effusion or free air. Small fat containing periumbilical hernia Musculoskeletal: No  acute osseous abnormality IMPRESSION: 1. No CT evidence for acute intra-abdominal or pelvic abnormality. 2. Diverticular disease of the left colon without acute inflammatory process. Prominent submucosal fat deposition most evident at the right colon consistent with chronic inflammatory process. 3. Nonobstructing kidney stones. 4. Aortic atherosclerosis. Aortic Atherosclerosis (ICD10-I70.0). Electronically Signed   By: KDonavan FoilM.D.   On: 12/23/2022 16:52      Anaia Frith T. [place] If 7PM-7AM, please contact night-coverage [email or web address] 12/24/2022, 12:27 PM

## 2022-12-24 NOTE — Op Note (Signed)
[facility] Gastroenterology Patient Name: Miguel [name] Procedure Date: 12/24/2022 4:17 PM MRN: [id number] Account #: [id number] Date of Birth: 24-Jun-1961 Admit Type: Inpatient Age: 62 Room: [facility] ENDO ROOM 1 Gender: Male Note Status: Finalized Instrument Name: Jasper Riling [id number] Procedure:             Colonoscopy Indications:           Rectal bleeding Providers:             Andrey Farmer MD, MD Referring MD:          No Local Md, MD (Referring MD) Medicines:             Monitored Anesthesia Care Complications:         No immediate complications. Estimated blood loss:                         Minimal. Procedure:             Pre-Anesthesia Assessment:                        - Prior to the procedure, a History and Physical was                         performed, and patient medications and allergies were                         reviewed. The patient is competent. The risks and                         benefits of the procedure and the sedation options and                         risks were discussed with the patient. All questions                         were answered and informed consent was obtained.                         Patient identification and proposed procedure were                         verified by the physician, the nurse, the                         anesthesiologist, the anesthetist and the technician                         in the endoscopy suite. Mental Status Examination:                         alert and oriented. Airway Examination: normal                         oropharyngeal airway and neck mobility. Respiratory                         Examination: clear to auscultation. CV Examination:  normal. Prophylactic Antibiotics: The patient does not                         require prophylactic antibiotics. Prior                         Anticoagulants: The patient has taken no anticoagulant                         or antiplatelet  agents. ASA Grade Assessment: II - A                         patient with mild systemic disease. After reviewing                         the risks and benefits, the patient was deemed in                         satisfactory condition to undergo the procedure. The                         anesthesia plan was to use monitored anesthesia care                         (MAC). Immediately prior to administration of                         medications, the patient was re-assessed for adequacy                         to receive sedatives. The heart rate, respiratory                         rate, oxygen saturations, blood pressure, adequacy of                         pulmonary ventilation, and response to care were                         monitored throughout the procedure. The physical                         status of the patient was re-assessed after the                         procedure.                        After obtaining informed consent, the colonoscope was                         passed under direct vision. Throughout the procedure,                         the patient's blood pressure, pulse, and oxygen                         saturations were monitored continuously. The  Colonoscope was introduced through the anus and                         advanced to the the terminal ileum. The colonoscopy                         was performed without difficulty. The patient                         tolerated the procedure well. The quality of the bowel                         preparation was fair. The terminal ileum, ileocecal                         valve, appendiceal orifice, and rectum were                         photographed. Findings:      The perianal and digital rectal examinations were normal.      The terminal ileum appeared normal.      A localized area of mildly erythematous mucosa was found in the       ascending colon. Biopsies were taken with a cold forceps for  histology.       Estimated blood loss was minimal.      A few small-mouthed diverticula were found in the sigmoid colon.      Internal hemorrhoids were found during retroflexion. The hemorrhoids       were Grade I (internal hemorrhoids that do not prolapse).      The exam was otherwise without abnormality on direct and retroflexion       views. Impression:            - Preparation of the colon was fair.                        - The examined portion of the ileum was normal.                        - Erythematous mucosa in the ascending colon. Biopsied.                        - Diverticulosis in the sigmoid colon.                        - Internal hemorrhoids.                        - The examination was otherwise normal on direct and                         retroflexion views. Recommendation:        - Return patient to hospital ward for ongoing care.                        - Resume regular diet.                        - Return to GI clinic at the next available  appointment.                        - Ok to discontinue PPI, f/u path. Likely bleeding was                         anal outlet from hemorrhoids. Procedure Code(s):     --- Professional ---                        [phone], Colonoscopy, flexible; with biopsy, single or                         multiple Diagnosis Code(s):     --- Professional ---                        K64.0, First degree hemorrhoids                        K63.89, Other specified diseases of intestine                        K62.5, Hemorrhage of anus and rectum                        K57.30, Diverticulosis of large intestine without                         perforation or abscess without bleeding CPT copyright 2022 [employer]. All rights reserved. The codes documented in this report are preliminary and upon coder review may  be revised to meet current compliance requirements. Andrey Farmer MD, MD 12/24/2022 4:58:02 PM Number of  Addenda: 0 Note Initiated On: 12/24/2022 4:17 PM Scope Withdrawal Time: 0 hours 4 minutes 41 seconds  Total Procedure Duration: 0 hours 7 minutes 39 seconds  Estimated Blood Loss:  Estimated blood loss was minimal.      [facility] [place]

## 2022-12-24 NOTE — Anesthesia Preprocedure Evaluation (Addendum)
Anesthesia Evaluation  Patient identified by MRN, date of birth, ID band Patient awake    Reviewed: Allergy & Precautions, NPO status , Patient's Chart, lab work & pertinent test results  History of Anesthesia Complications Negative for: history of anesthetic complications  Airway Mallampati: II  TM Distance: >3 FB Neck ROM: Full    Dental  (+) Missing, Poor Dentition,    Pulmonary shortness of breath, asthma , neg sleep apnea, neg COPD, Patient abstained from smoking.Not current smoker, former smoker Uses advair PRN, does not need regular inhaler use.   Pulmonary exam normal breath sounds clear to auscultation       Cardiovascular Exercise Tolerance: Good METShypertension, (-) CAD and (-) Past MI negative cardio ROS (-) dysrhythmias  Rhythm:Regular Rate:Normal - Systolic murmurs    Neuro/Psych  PSYCHIATRIC DISORDERS Anxiety Depression    negative neurological ROS     GI/Hepatic ,GERD  Medicated and Controlled,,(+)     substance abuse  alcohol useGI Bleed   Endo/Other  neg diabetes    Renal/GU Renal diseasenegative Renal ROS     Musculoskeletal   Abdominal   Peds  Hematology  (+) Blood dyscrasia, anemia   Anesthesia Other Findings Past Medical History: No date: Alcohol abuse No date: Anxiety No date: Asthma No date: GERD (gastroesophageal reflux disease) No date: Gout No date: Hypertension No date: Kidney stone No date: OCD (obsessive compulsive disorder) No date: Renal colic  Reproductive/Obstetrics                              Anesthesia Physical Anesthesia Plan  ASA: 2  Anesthesia Plan: General   Post-op Pain Management: Minimal or no pain anticipated   Induction: Intravenous  PONV Risk Score and Plan: 3 and Propofol infusion, TIVA and Ondansetron  Airway Management Planned: Nasal Cannula  Additional Equipment: None  Intra-op Plan:   Post-operative Plan:    Informed Consent: I have reviewed the patients History and Physical, chart, labs and discussed the procedure including the risks, benefits and alternatives for the proposed anesthesia with the patient or authorized representative who has indicated his/her understanding and acceptance.     Dental advisory given  Plan Discussed with: CRNA and Surgeon  Anesthesia Plan Comments: (Discussed risks of anesthesia with patient, including possibility of difficulty with spontaneous ventilation under anesthesia necessitating airway intervention, PONV, and rare risks such as cardiac or respiratory or neurological events, and allergic reactions. Discussed the role of CRNA in patient's perioperative care. Patient understands.)         Anesthesia Quick Evaluation

## 2022-12-24 NOTE — Progress Notes (Signed)
Patient returned from colonoscopy in bed.  Patient denies pain, educated advance diet slowly to regular diet to decrease gastric irritation with patient reports understanding.

## 2022-12-24 NOTE — Progress Notes (Signed)
Patient complaints of increase anxiety, sweating, vomiting.  "I need my anxiety medicine."  Dr Wendee Beavers, MD notified.  Anxiety medication changed.  Anxiety and nausea medication administered with good results.

## 2022-12-24 NOTE — Progress Notes (Signed)
[facility]  ROUNDING NOTE   Subjective:   Patient appears anxious, restless States he requested medication for his nerves Room air Denies pain NPO for procedure Admits to anxiety for procedure  Objective:  Vital signs in last 24 hours:  Temp:  [97.9 F (36.6 C)-98.1 F (36.7 C)] 98.1 F (36.7 C) (02/08 0819) Pulse Rate:  [75-94] 75 (02/08 0819) Resp:  [15-19] 16 (02/08 0819) BP: (115-161)/(73-96) 153/90 (02/08 0819) SpO2:  [97 %-100 %] 99 % (02/08 0819)  Weight change:  Filed Weights   12/22/22 1251  Weight: 86.6 kg    Intake/Output: I/O last 3 completed shifts: In: 779.5 [I.V.:679.5; IV Piggyback:100] Out: -    Intake/Output this shift:  No intake/output data recorded.  Physical Exam: General: NAD, restless  Head: Normocephalic, atraumatic. Moist oral mucosal membranes  Eyes: Anicteric  Lungs:  Clear to auscultation, normal effort  Heart: Regular rate and rhythm  Abdomen:  Soft, nontender  Extremities:  No peripheral edema.  Neurologic: Nonfocal, moving all four extremities  Skin: No lesions  Access: None    Basic Metabolic Panel: Recent Labs  Lab 12/22/22 1354 12/22/22 1528 12/22/22 1811 12/23/22 0525 12/24/22 0311  NA 132* 132*  --  135 137  K >7.5* 6.4*  --  4.6 4.4  CL 104 106  --  109 112*  CO2 21* 18*  --  20* 20*  GLUCOSE 109* 111*  --  100* 107*  BUN 34* 35*  --  27* 14  CREATININE 1.69* 1.87*  --  1.54* 1.18  CALCIUM 9.5 8.9  --  8.1* 8.3*  MG  --   --  1.5* 2.0 1.8  PHOS  --   --  2.2* 4.7*  --     Liver Function Tests: Recent Labs  Lab 12/22/22 1354 12/24/22 0311  AST 16 15  ALT 17 14  ALKPHOS 92 72  BILITOT 0.8 0.7  PROT 8.0 6.2*  ALBUMIN 4.3 3.5   Recent Labs  Lab 12/24/22 0311  LIPASE 34   No results for input(s): "AMMONIA" in the last 168 hours.  CBC: Recent Labs  Lab 12/22/22 1253 12/23/22 0525 12/24/22 0311  WBC 12.0* 7.2 6.5  NEUTROABS  --   --  4.3  HGB 13.5 10.8* 11.9*  HCT 42.9 34.1*  37.4*  MCV 88.3 88.8 88.6  PLT 284 217 221    Cardiac Enzymes: No results for input(s): "CKTOTAL", "CKMB", "CKMBINDEX", "TROPONINI" in the last 168 hours.  BNP: Invalid input(s): "POCBNP"  CBG: No results for input(s): "GLUCAP" in the last 168 hours.  Microbiology: Results for orders placed or performed during the hospital encounter of 09/05/21  Resp Panel by RT-PCR (Flu A&B, Covid) Nasopharyngeal Swab     Status: None   Collection Time: 09/05/21  4:33 AM   Specimen: Nasopharyngeal Swab; Nasopharyngeal(NP) swabs in vial transport medium  Result Value Ref Range Status   SARS Coronavirus 2 by RT PCR NEGATIVE NEGATIVE Final    Comment: (NOTE) SARS-CoV-2 target nucleic acids are NOT DETECTED.  The SARS-CoV-2 RNA is generally detectable in upper respiratory specimens during the acute phase of infection. The lowest concentration of SARS-CoV-2 viral copies this assay can detect is 138 copies/mL. A negative result does not preclude SARS-Cov-2 infection and should not be used as the sole basis for treatment or other patient management decisions. A negative result may occur with  improper specimen collection/handling, submission of specimen other than nasopharyngeal swab, presence of viral mutation(s) within the areas targeted  by this assay, and inadequate number of viral copies(<138 copies/mL). A negative result must be combined with clinical observations, patient history, and epidemiological information. The expected result is Negative.  Fact Sheet for Patients:  EntrepreneurPulse.com.au  Fact Sheet for Healthcare Providers:  IncredibleEmployment.be  This test is no t yet approved or cleared by the [place] FDA and  has been authorized for detection and/or diagnosis of SARS-CoV-2 by FDA under an Emergency Use Authorization (EUA). This EUA will remain  in effect (meaning this test can be used) for the duration of the COVID-19 declaration  under Section 564(b)(1) of the Act, 21 U.S.C.section 360bbb-3(b)(1), unless the authorization is terminated  or revoked sooner.       Influenza A by PCR NEGATIVE NEGATIVE Final   Influenza B by PCR NEGATIVE NEGATIVE Final    Comment: (NOTE) The Xpert Xpress SARS-CoV-2/FLU/RSV plus assay is intended as an aid in the diagnosis of influenza from Nasopharyngeal swab specimens and should not be used as a sole basis for treatment. Nasal washings and aspirates are unacceptable for Xpert Xpress SARS-CoV-2/FLU/RSV testing.  Fact Sheet for Patients: EntrepreneurPulse.com.au  Fact Sheet for Healthcare Providers: IncredibleEmployment.be  This test is not yet approved or cleared by the [place] FDA and has been authorized for detection and/or diagnosis of SARS-CoV-2 by FDA under an Emergency Use Authorization (EUA). This EUA will remain in effect (meaning this test can be used) for the duration of the COVID-19 declaration under Section 564(b)(1) of the Act, 21 U.S.C. section 360bbb-3(b)(1), unless the authorization is terminated or revoked.  Performed at [facility], [address] [place], [place] [address]     Coagulation Studies: No results for input(s): "LABPROT", "INR" in the last 72 hours.  Urinalysis: Recent Labs    12/22/22 2222  COLORURINE YELLOW*  LABSPEC 1.009  PHURINE 5.0  GLUCOSEU NEGATIVE  HGBUR NEGATIVE  BILIRUBINUR NEGATIVE  KETONESUR NEGATIVE  PROTEINUR NEGATIVE  NITRITE NEGATIVE  LEUKOCYTESUR NEGATIVE      Imaging: CT ABDOMEN PELVIS W CONTRAST  Result Date: 12/23/2022 CLINICAL DATA:  Right upper quadrant pain and rectal bleeding EXAM: CT ABDOMEN AND PELVIS WITH CONTRAST TECHNIQUE: Multidetector CT imaging of the abdomen and pelvis was performed using the standard protocol following bolus administration of intravenous contrast. RADIATION DOSE REDUCTION: This exam was performed according to the departmental  dose-optimization program which includes automated exposure control, adjustment of the mA and/or kV according to patient size and/or use of iterative reconstruction technique. CONTRAST:  178m OMNIPAQUE IOHEXOL 300 MG/ML  SOLN COMPARISON:  CT 10/12/2021 FINDINGS: Lower chest: Lung bases are clear. Hepatobiliary: No focal liver abnormality is seen. Status post cholecystectomy. No biliary dilatation. Pancreas: Unremarkable. No pancreatic ductal dilatation or surrounding inflammatory changes. Spleen: Normal in size without focal abnormality. Adrenals/Urinary Tract: Adrenal glands are normal. No hydronephrosis. Small nonobstructing bilateral kidney stones, the largest is on the right and measures up to 7 mm. The bladder is unremarkable. Stomach/Bowel: The stomach is nonenlarged. No dilated small bowel. No acute bowel wall thickening. Negative appendix. Prominent submucosal fat deposition most evident at the right colon consistent with chronic inflammatory process. Diverticular disease of left colon. Vascular/Lymphatic: Mild aortic atherosclerosis. No aneurysm. No suspicious lymph nodes Reproductive: Prostate is unremarkable. Other: Negative for pelvic effusion or free air. Small fat containing periumbilical hernia Musculoskeletal: No acute osseous abnormality IMPRESSION: 1. No CT evidence for acute intra-abdominal or pelvic abnormality. 2. Diverticular disease of the left colon without acute inflammatory process. Prominent submucosal fat deposition most evident at  the right colon consistent with chronic inflammatory process. 3. Nonobstructing kidney stones. 4. Aortic atherosclerosis. Aortic Atherosclerosis (ICD10-I70.0). Electronically Signed   By: Donavan Foil M.D.   On: 12/23/2022 16:52   DG Chest Port 1 View  Result Date: 12/22/2022 CLINICAL DATA:  Dyspnea on exertion. EXAM: PORTABLE CHEST 1 VIEW COMPARISON:  October 11, 2021 FINDINGS: Cardiomediastinal silhouette is normal. Mediastinal contours appear intact.  Low lung volumes with streaky peribronchial airspace opacities which may represent consolidation or atelectasis. Osseous structures are without acute abnormality. Soft tissues are grossly normal. IMPRESSION: Low lung volumes with streaky peribronchial airspace opacities which may represent consolidation or atelectasis. Electronically Signed   By: Fidela Salisbury M.D.   On: 12/22/2022 18:16     Medications:    pantoprazole 8 mg/hr (12/24/22 0839)    ascorbic acid  500 mg Oral Daily   busPIRone  5 mg Oral TID   cyanocobalamin  1,000 mcg Intramuscular Once   Followed by   Derrill Memo ON 12/25/2022] vitamin B-12  1,000 mcg Oral Daily   folic acid  1 mg Oral Daily   mometasone-formoterol  2 puff Inhalation BID   [START ON 12/26/2022] pantoprazole  40 mg Intravenous Q12H   thiamine  100 mg Oral Daily   acetaminophen **OR** acetaminophen, hydrALAZINE, HYDROmorphone (DILAUDID) injection, levalbuterol **AND** ipratropium, LORazepam, ondansetron **OR** ondansetron (ZOFRAN) IV, senna-docusate  Assessment/ Plan:  [name] is a 62 y.o.  male  with past medical history concerning for hypertension, GERD, asthma, renal calculi and alcohol abuse, who was admitted to [facility] on 12/22/2022 for Hyperkalemia [E87.5] Rectal bleeding [K62.5] Acute GI bleeding [K92.2] AKI (acute kidney injury) ([place]) [N17.9]   Acute kidney injury likely secondary to hypovolemia and NSAID use. Normal renal function noted in January 2024. Prolonged poor oral intake with episodes of diarrhea and vomiting. No IV contrast exposure.  Renal function has improved with IVF. Continue to  avoid nephrotoxic agents and therapies, including hypotension. Patient will need to follow up with Dr Holley Raring at discharge.   Lab Results  Component Value Date   CREATININE 1.18 12/24/2022   CREATININE 1.54 (H) 12/23/2022   CREATININE 1.87 (H) 12/22/2022    Intake/Output Summary (Last 24 hours) at 12/24/2022 1305 Last data filed at 12/23/2022 1542 Gross  per 24 hour  Intake 79.53 ml  Output --  Net 79.53 ml   2. Anemia of acute blood loss  Normocytic Lab Results  Component Value Date   HGB 11.9 (L) 12/24/2022    Hgb increased today. GI planning for EGD/Colonoscopy today.   3. Hyperkalemia/hyponatremia. Potassium greater then 7.5 on admission. Potassium and sodium corrected.     LOS: 2   2/8/20241:05 PM

## 2022-12-24 NOTE — Transfer of Care (Signed)
Immediate Anesthesia Transfer of Care Note  Patient: OLLEN RAO  Procedure(s) Performed: Procedure(s): ESOPHAGOGASTRODUODENOSCOPY (EGD) (N/A) COLONOSCOPY (N/A)  Patient Location: PACU and Endoscopy Unit  Anesthesia Type:General  Level of Consciousness: sedated  Airway & Oxygen Therapy: Patient Spontanous Breathing and Patient connected to nasal cannula oxygen  Post-op Assessment: Report given to RN and Post -op Vital signs reviewed and stable  Post vital signs: Reviewed and stable  Last Vitals:  Vitals:   12/24/22 0819 12/24/22 1606  BP: (!) 153/90 (!) 152/88  Pulse: 75 86  Resp: 16 18  Temp: 36.7 C (!) 36.4 C  SpO2: 37% 543%    Complications: No apparent anesthesia complications

## 2022-12-24 NOTE — Care Plan (Signed)
EGD/Colon overall unremarkable. Mild colitis in ascending colon but non-specific. Biopsies taken. No indication for antibiotics. Diet advanced. Bleeding likely anal outlet. Can f/u with his GI doctor as an outpatient. Can stop PPI IV and switch to PO if complains of GERD.  Raylene Miyamoto MD, MPH [place]

## 2022-12-24 NOTE — Telephone Encounter (Signed)
Called pt to check in after sending end of therapy letter and making 1st follow up call and see if pt needed any extra resources or help finding a new PCP. No answer left msg.

## 2022-12-24 NOTE — Op Note (Signed)
[facility] Patient Name: Miguel [name] Procedure Date: 12/24/2022 4:17 PM MRN: [id number] Account #: [id number] Date of Birth: [birth year] Admit Type: Inpatient Age: 62 Room: [facility] ENDO ROOM 1 Gender: Male Note Status: Finalized Instrument Name: Upper Endoscope [id number] Procedure:             Upper GI endoscopy Indications:           Hematemesis Providers:             Andrey Farmer MD, MD Referring MD:          No Local Md, MD (Referring MD) Medicines:             Monitored Anesthesia Care Complications:         No immediate complications. Estimated blood loss:                         Minimal. Procedure:             Pre-Anesthesia Assessment:                        - Prior to the procedure, a History and Physical was                         performed, and patient medications and allergies were                         reviewed. The patient is competent. The risks and                         benefits of the procedure and the sedation options and                         risks were discussed with the patient. All questions                         were answered and informed consent was obtained.                         Patient identification and proposed procedure were                         verified by the physician, the nurse, the                         anesthesiologist, the anesthetist and the technician                         in the endoscopy suite. Mental Status Examination:                         alert and oriented. Airway Examination: normal                         oropharyngeal airway and neck mobility. Respiratory                         Examination: clear to auscultation. CV Examination:  normal. Prophylactic Antibiotics: The patient does not                         require prophylactic antibiotics. Prior                         Anticoagulants: The patient has taken no anticoagulant                         or  antiplatelet agents. ASA Grade Assessment: II - A                         patient with mild systemic disease. After reviewing                         the risks and benefits, the patient was deemed in                         satisfactory condition to undergo the procedure. The                         anesthesia plan was to use monitored anesthesia care                         (MAC). Immediately prior to administration of                         medications, the patient was re-assessed for adequacy                         to receive sedatives. The heart rate, respiratory                         rate, oxygen saturations, blood pressure, adequacy of                         pulmonary ventilation, and response to care were                         monitored throughout the procedure. The physical                         status of the patient was re-assessed after the                         procedure.                        After obtaining informed consent, the endoscope was                         passed under direct vision. Throughout the procedure,                         the patient's blood pressure, pulse, and oxygen                         saturations were monitored continuously. The Endoscope  was introduced through the mouth, and advanced to the                         second part of duodenum. The upper GI endoscopy was                         accomplished without difficulty. The patient tolerated                         the procedure well. Findings:      A 3 cm hiatal hernia was present.      A non-obstructing Schatzki ring was found in the lower third of the       esophagus.      The exam of the esophagus was otherwise normal.      The entire examined stomach was normal.      The examined duodenum was normal. Impression:            - 3 cm hiatal hernia.                        - Non-obstructing Schatzki ring.                        - Normal stomach.                         - Normal examined duodenum.                        - No specimens collected. Recommendation:        - Perform a colonoscopy today. Procedure Code(s):     --- Professional ---                        [id number], Esophagogastroduodenoscopy, flexible,                         transoral; diagnostic, including collection of                         specimen(s) by brushing or washing, when performed                         (separate procedure) Diagnosis Code(s):     --- Professional ---                        K44.9, Diaphragmatic hernia without obstruction or                         gangrene                        K22.2, Esophageal obstruction                        K92.0, Hematemesis CPT copyright 2022 [employer]. All rights reserved. The codes documented in this report are preliminary and upon coder review may  be revised to meet current compliance requirements. Andrey Farmer MD, MD 12/24/2022 4:51:27 PM Number of Addenda: 0 Note Initiated On: 12/24/2022 4:17 PM Estimated Blood Loss:  Estimated blood loss was minimal.  [facility]

## 2022-12-25 ENCOUNTER — Other Ambulatory Visit: Payer: Self-pay

## 2022-12-25 ENCOUNTER — Encounter: Payer: Self-pay | Admitting: Gastroenterology

## 2022-12-25 DIAGNOSIS — F1994 Other psychoactive substance use, unspecified with psychoactive substance-induced mood disorder: Secondary | ICD-10-CM | POA: Diagnosis not present

## 2022-12-25 DIAGNOSIS — F332 Major depressive disorder, recurrent severe without psychotic features: Secondary | ICD-10-CM | POA: Diagnosis not present

## 2022-12-25 DIAGNOSIS — F4010 Social phobia, unspecified: Secondary | ICD-10-CM | POA: Diagnosis not present

## 2022-12-25 DIAGNOSIS — E875 Hyperkalemia: Secondary | ICD-10-CM | POA: Diagnosis not present

## 2022-12-25 LAB — COMPREHENSIVE METABOLIC PANEL
ALT: 13 U/L (ref 0–44)
AST: 12 U/L — ABNORMAL LOW (ref 15–41)
Albumin: 3.3 g/dL — ABNORMAL LOW (ref 3.5–5.0)
Alkaline Phosphatase: 64 U/L (ref 38–126)
Anion gap: 4 — ABNORMAL LOW (ref 5–15)
BUN: 9 mg/dL (ref 8–23)
CO2: 20 mmol/L — ABNORMAL LOW (ref 22–32)
Calcium: 8.3 mg/dL — ABNORMAL LOW (ref 8.9–10.3)
Chloride: 114 mmol/L — ABNORMAL HIGH (ref 98–111)
Creatinine, Ser: 1.11 mg/dL (ref 0.61–1.24)
GFR, Estimated: >60 mL/min (ref >60)
Glucose, Bld: 106 mg/dL — ABNORMAL HIGH (ref 70–99)
Potassium: 4.1 mmol/L (ref 3.5–5.1)
Sodium: 138 mmol/L (ref 135–145)
Total Bilirubin: 0.6 mg/dL (ref 0.3–1.2)
Total Protein: 6.1 g/dL — ABNORMAL LOW (ref 6.5–8.1)

## 2022-12-25 LAB — C-REACTIVE PROTEIN: CRP: 0.5 mg/dL (ref <1.0)

## 2022-12-25 LAB — CBC
HCT: 33.6 % — ABNORMAL LOW (ref 39.0–52.0)
Hemoglobin: 10.8 g/dL — ABNORMAL LOW (ref 13.0–17.0)
MCH: 28.6 pg (ref 26.0–34.0)
MCHC: 32.1 g/dL (ref 30.0–36.0)
MCV: 89.1 fL (ref 80.0–100.0)
Platelets: 223 10*3/uL (ref 150–400)
RBC: 3.77 MIL/uL — ABNORMAL LOW (ref 4.22–5.81)
RDW: 15.1 % (ref 11.5–15.5)
WBC: 7.4 10*3/uL (ref 4.0–10.5)
nRBC: 0 % (ref 0.0–0.2)

## 2022-12-25 LAB — SEDIMENTATION RATE: Sed Rate: 9 mm/hr (ref 0–20)

## 2022-12-25 LAB — MAGNESIUM: Magnesium: 1.7 mg/dL (ref 1.7–2.4)

## 2022-12-25 LAB — PHOSPHORUS: Phosphorus: 2.9 mg/dL (ref 2.5–4.6)

## 2022-12-25 MED ORDER — POLYETHYLENE GLYCOL 3350 17 GM/SCOOP PO POWD
17.0000 g | Freq: Two times a day (BID) | ORAL | 0 refills | Status: DC | PRN
  Filled 2022-12-25: qty 238, 7d supply, fill #0

## 2022-12-25 MED ORDER — SENNOSIDES-DOCUSATE SODIUM 8.6-50 MG PO TABS: 1.0000 | ORAL_TABLET | Freq: Every evening | ORAL | Status: DC | PRN

## 2022-12-25 MED ORDER — BUSPIRONE HCL 5 MG PO TABS
5.0000 mg | ORAL_TABLET | Freq: Three times a day (TID) | ORAL | 2 refills | Status: DC
  Filled 2022-12-25: qty 90, 30d supply, fill #0

## 2022-12-25 MED ORDER — CYANOCOBALAMIN 1000 MCG PO TABS
1000.0000 ug | ORAL_TABLET | Freq: Every day | ORAL | 2 refills | Status: AC
  Filled 2022-12-25: qty 30, 30d supply, fill #0

## 2022-12-25 MED ORDER — POLYETHYLENE GLYCOL 3350 17 G PO PACK: 17.0000 g | PACK | Freq: Two times a day (BID) | ORAL | Status: DC | PRN

## 2022-12-25 NOTE — Discharge Summary (Signed)
Physician Discharge Summary  Miguel [name] [id number] DOB: Oct 23, 1961 DOA: 12/22/2022  PCP: Pcp, No  Admit date: 12/22/2022 Discharge date: 12/25/2022 Admitted From: Home. Disposition: Home Recommendations for Outpatient Follow-up:  Follow up with PCP in 1 to 2 weeks CMP and CBC at follow-up Consider SSRI for anxiety Please follow up on the following pending results: Biopsy from colonoscopy  Home Health: Not indicated Equipment/Devices: Not indicated  Discharge Condition: Stable CODE STATUS: Full code   Hospital course 62 year old M with PMH of chronic RUQ abdominal pain, HTN, GERD, anxiety and former alcohol abuse currently sober presenting with hematochezia, hematemesis and generalized weakness and admitted for GI bleed, AKI, hyperkalemia.  Patient reports daily NSAID use for his chronic pain.  Vital stable.  Hgb 13.5 (baseline).  Cr 1.87 (baseline 1.1).  BUN 35. K > 7.5.  UDS positive for marijuana and tricyclic's.  Patient was given IV insulin, calcium gluconate, sodium bicarb, fluid bolus and Lokelma.  GI and nephrology consulted.   The next day, bleeding subsided.  Hgb down by 3 g.  Hyperkalemia resolved.  AKI improved.  Continues to endorse RUQ abdominal tenderness.  CT abdomen and pelvis without significant finding to explain patient's symptoms but prominent submucosal fat deposition most evident at the right colon consistent with chronic inflammatory process.    AKI and hyperkalemia resolved.  Patient underwent EGD and colonoscopy on 2/8.  EGD showed 3 cm hiatal hernia and none obstructing Schatzki ring.  Colonoscopy showed erythematous mucosa in ascending colon, diverticulosis in sigmoid colon and nonbleeding internal hemorrhoid.  Hemoglobin remained stable.  He tolerated diet.  Discharged on bowel regimen to avoid constipation.  Advised to refrain from NSAID use.  See individual problem list below for more.   Problems addressed during this hospitalization Principal  Problem:   Hyperkalemia Active Problems:   AKI (acute kidney injury) ([place])   Hypertension   Substance induced mood disorder (HCC)   Severe recurrent major 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 (HCC)   GERD (gastroesophageal reflux disease)   Social anxiety disorder   Leukocytosis   GI bleed   RUQ abdominal tenderness   Acute GI bleeding   Acute blood loss anemia in the setting of acute GI bleed with hematemesis and hematochezia: Hgb stable after initial drop.  No further bleeding last night or this morning.  Patient reports taking Aleve 1 tablet daily for chronic pain.  No iron deficiency on anemia panel.  EGD and colonoscopy as above Recent Labs    12/07/22 1505 12/22/22 1253 12/23/22 0525 12/24/22 0311 12/25/22 0401  HGB 13.5 13.5 10.8* 11.9* 10.8*  -Advised to refrain from NSAID. -Continue PPI -Recheck CBC at follow-up   AKI: Likely due to NSAID and ACE inhibitor's.  AKI resolved. Recent Labs    12/07/22 1505 12/22/22 1354 12/22/22 1528 12/23/22 0525 12/24/22 0311 12/25/22 0401  BUN 18 34* 35* 27* 14 9  CREATININE 1.16 1.69* 1.87* 1.54* 1.18 1.11  -Advised to refrain from NSAID -Recheck at follow-up   Hyperkalemia/hypomagnesemia: In the setting of AKI.  Resolved.   Right back and abdominal pain: Unclear etiology but looks MSK and/or psychogenic.  CT abdomen and pelvis unrevealing.  Improved. -Advised to refrain from NSAIDs. -May use Tylenol as needed   Chronic COPD: Stable. -Continue home Symbicort.   General anxiety disorder: Reports taking Ativan 0.5 mg at night at home.  Denies drinking alcohol in over a year. -Started BuSpar 5 mg 3 times daily -Continue home Ativan-but high risk for withdrawal symptoms. -Consider SSRI  outpatient   Essential hypertension: BP elevated likely from holding lisinopril. -Resume home lisinopril   GERD -Continue home Prilosec   Vitamin B12 deficiency: 186.  Received vitamin B12 injection in house. -Continue p.o. vitamin  B12   Leukocytosis: Resolved.             Vital signs Vitals:   12/24/22 1806 12/24/22 2045 12/25/22 0528 12/25/22 0723  BP: (!) 145/90 (!) 146/79 (!) 141/82 (!) 161/90  Pulse: 72 96 74 77  Temp: 98.4 F (36.9 C) 98 F (36.7 C) 97.8 F (36.6 C) 98.4 F (36.9 C)  Resp: 18 17 18 16  $ Height:      Weight:      SpO2: 100% 98% 96% 99%  TempSrc: Oral Oral Oral Oral  BMI (Calculated):         Discharge exam  GENERAL: No apparent distress.  Nontoxic. HEENT: MMM.  Vision and hearing grossly intact.  NECK: Supple.  No apparent JVD.  RESP:  No IWOB.  Fair aeration bilaterally. CVS:  RRR. Heart sounds normal.  ABD/GI/GU: BS+. Abd soft, NTND.  MSK/EXT:  Moves extremities. No apparent deformity. No edema.  SKIN: no apparent skin lesion or wound NEURO: Awake and alert. Oriented appropriately.  No apparent focal neuro deficit. PSYCH: Calm. Normal affect.   Discharge Instructions Discharge Instructions     Call MD for:  extreme fatigue   Complete by: As directed    Call MD for:  persistant dizziness or light-headedness   Complete by: As directed    Call MD for:  severe uncontrolled pain   Complete by: As directed    Diet - low sodium heart healthy   Complete by: As directed    Discharge instructions   Complete by: As directed    It has been a pleasure taking care of you!  You were hospitalized due to gastrointestinal bleed, acute kidney injury and high potassium.  Your bleeding seems to have resolved.  Your CT, EGD and colonoscopy did not reveal source of your bleeding.  Your kidney function recovered.  Your potassium is back to normal.  We strongly recommend not using over-the-counter pain medication/NSAID other than plain Tylenol.  Follow-up with your primary care doctor in 1 to 2 weeks or sooner if needed.   Take care,   Increase activity slowly   Complete by: As directed       Allergies as of 12/25/2022   No Known Allergies      Medication List     TAKE these  medications    ascorbic acid 500 MG tablet Commonly known as: VITAMIN C Take by mouth.   Breo Ellipta 200-25 MCG/ACT Aepb Generic drug: fluticasone furoate-vilanterol Inhale one puff by mouth daily   busPIRone 5 MG tablet Commonly known as: BUSPAR Take 1 tablet (5 mg total) by mouth 3 (three) times daily.   cyanocobalamin 1000 MCG tablet Take 1 tablet (1,000 mcg total) by mouth daily.   folic acid 1 MG tablet Commonly known as: FOLVITE TAKE ONE TABLET BY MOUTH ONCE EVERY DAY.   hydrOXYzine 25 MG tablet Commonly known as: ATARAX Take 1 tablet (25 mg total) by mouth 3 (three) times daily as needed for anxiety.   lisinopril 40 MG tablet Commonly known as: ZESTRIL TAKE ONE TABLET BY MOUTH ONCE EVERY DAY.   LORazepam 0.5 MG tablet Commonly known as: Ativan Take 1 tablet (0.5 mg total) by mouth at bedtime as needed for anxiety.   metoprolol tartrate 25 MG tablet Commonly known  as: LOPRESSOR Take 1 tablet (25 mg total) by mouth once daily.   omeprazole 20 MG capsule Commonly known as: PRILOSEC Take 20 mg by mouth daily.   polyethylene glycol powder 17 GM/SCOOP powder Commonly known as: MiraLax Take 17 g by mouth 2 (two) times daily as needed for moderate constipation.   ProAir HFA 108 (90 Base) MCG/ACT inhaler Generic drug: albuterol Inhale 2 puffs into the lungs once every 4 (four) hours as needed.   senna-docusate 8.6-50 MG tablet Commonly known as: Senokot-S Take 1 tablet by mouth at bedtime as needed for mild constipation.   thiamine 100 MG tablet Commonly known as: VITAMIN B1 Take 1 tablet (100 mg total) by mouth daily.        Consultations: Gastroenterology  Procedures/Studies: EGD and colonoscopy as above   CT ABDOMEN PELVIS W CONTRAST  Result Date: 12/23/2022 CLINICAL DATA:  Right upper quadrant pain and rectal bleeding EXAM: CT ABDOMEN AND PELVIS WITH CONTRAST TECHNIQUE: Multidetector CT imaging of the abdomen and pelvis was performed using the  standard protocol following bolus administration of intravenous contrast. RADIATION DOSE REDUCTION: This exam was performed according to the departmental dose-optimization program which includes automated exposure control, adjustment of the mA and/or kV according to patient size and/or use of iterative reconstruction technique. CONTRAST:  165m OMNIPAQUE IOHEXOL 300 MG/ML  SOLN COMPARISON:  CT 10/12/2021 FINDINGS: Lower chest: Lung bases are clear. Hepatobiliary: No focal liver abnormality is seen. Status post cholecystectomy. No biliary dilatation. Pancreas: Unremarkable. No pancreatic ductal dilatation or surrounding inflammatory changes. Spleen: Normal in size without focal abnormality. Adrenals/Urinary Tract: Adrenal glands are normal. No hydronephrosis. Small nonobstructing bilateral kidney stones, the largest is on the right and measures up to 7 mm. The bladder is unremarkable. Stomach/Bowel: The stomach is nonenlarged. No dilated small bowel. No acute bowel wall thickening. Negative appendix. Prominent submucosal fat deposition most evident at the right colon consistent with chronic inflammatory process. Diverticular disease of left colon. Vascular/Lymphatic: Mild aortic atherosclerosis. No aneurysm. No suspicious lymph nodes Reproductive: Prostate is unremarkable. Other: Negative for pelvic effusion or free air. Small fat containing periumbilical hernia Musculoskeletal: No acute osseous abnormality IMPRESSION: 1. No CT evidence for acute intra-abdominal or pelvic abnormality. 2. Diverticular disease of the left colon without acute inflammatory process. Prominent submucosal fat deposition most evident at the right colon consistent with chronic inflammatory process. 3. Nonobstructing kidney stones. 4. Aortic atherosclerosis. Aortic Atherosclerosis (ICD10-I70.0). Electronically Signed   By: KDonavan FoilM.D.   On: 12/23/2022 16:52   DG Chest Port 1 View  Result Date: 12/22/2022 CLINICAL DATA:  Dyspnea on  exertion. EXAM: PORTABLE CHEST 1 VIEW COMPARISON:  October 11, 2021 FINDINGS: Cardiomediastinal silhouette is normal. Mediastinal contours appear intact. Low lung volumes with streaky peribronchial airspace opacities which may represent consolidation or atelectasis. Osseous structures are without acute abnormality. Soft tissues are grossly normal. IMPRESSION: Low lung volumes with streaky peribronchial airspace opacities which may represent consolidation or atelectasis. Electronically Signed   By: DFidela SalisburyM.D.   On: 12/22/2022 18:16       The results of significant diagnostics from this hospitalization (including imaging, microbiology, ancillary and laboratory) are listed below for reference.     Microbiology: No results found for this or any previous visit (from the past 240 hour(s)).   Labs:  CBC: Recent Labs  Lab 12/22/22 1253 12/23/22 0525 12/24/22 0311 12/25/22 0401  WBC 12.0* 7.2 6.5 7.4  NEUTROABS  --   --  4.3  --  HGB 13.5 10.8* 11.9* 10.8*  HCT 42.9 34.1* 37.4* 33.6*  MCV 88.3 88.8 88.6 89.1  PLT 284 217 221 223   BMP &GFR Recent Labs  Lab 12/22/22 1354 12/22/22 1528 12/22/22 1811 12/23/22 0525 12/24/22 0311 12/25/22 0401  NA 132* 132*  --  135 137 138  K >7.5* 6.4*  --  4.6 4.4 4.1  CL 104 106  --  109 112* 114*  CO2 21* 18*  --  20* 20* 20*  GLUCOSE 109* 111*  --  100* 107* 106*  BUN 34* 35*  --  27* 14 9  CREATININE 1.69* 1.87*  --  1.54* 1.18 1.11  CALCIUM 9.5 8.9  --  8.1* 8.3* 8.3*  MG  --   --  1.5* 2.0 1.8 1.7  PHOS  --   --  2.2* 4.7*  --  2.9   Estimated Creatinine Clearance: 74.8 mL/min (by C-G formula based on SCr of 1.11 mg/dL). Liver & Pancreas: Recent Labs  Lab 12/22/22 1354 12/24/22 0311 12/25/22 0401  AST 16 15 12*  ALT 17 14 13  $ ALKPHOS 92 72 64  BILITOT 0.8 0.7 0.6  PROT 8.0 6.2* 6.1*  ALBUMIN 4.3 3.5 3.3*   Recent Labs  Lab 12/24/22 0311  LIPASE 34   No results for input(s): "AMMONIA" in the last 168  hours. Diabetic: No results for input(s): "HGBA1C" in the last 72 hours. No results for input(s): "GLUCAP" in the last 168 hours. Cardiac Enzymes: No results for input(s): "CKTOTAL", "CKMB", "CKMBINDEX", "TROPONINI" in the last 168 hours. No results for input(s): "PROBNP" in the last 8760 hours. Coagulation Profile: No results for input(s): "INR", "PROTIME" in the last 168 hours. Thyroid Function Tests: No results for input(s): "TSH", "T4TOTAL", "FREET4", "T3FREE", "THYROIDAB" in the last 72 hours. Lipid Profile: No results for input(s): "CHOL", "HDL", "LDLCALC", "TRIG", "CHOLHDL", "LDLDIRECT" in the last 72 hours. Anemia Panel: Recent Labs    12/24/22 0311  VITAMINB12 186  FOLATE >40.0  FERRITIN 70  TIBC 333  IRON 85  RETICCTPCT 1.3   Urine analysis:    Component Value Date/Time   COLORURINE YELLOW (A) 12/22/2022 2222   APPEARANCEUR CLEAR (A) 12/22/2022 2222   APPEARANCEUR Hazy 06/26/2014 2238   LABSPEC 1.009 12/22/2022 2222   LABSPEC 1.036 06/26/2014 2238   PHURINE 5.0 12/22/2022 2222   GLUCOSEU NEGATIVE 12/22/2022 2222   GLUCOSEU Negative 06/26/2014 2238   HGBUR NEGATIVE 12/22/2022 2222   BILIRUBINUR NEGATIVE 12/22/2022 2222   BILIRUBINUR 1+ 06/26/2014 2238   KETONESUR NEGATIVE 12/22/2022 2222   PROTEINUR NEGATIVE 12/22/2022 2222   NITRITE NEGATIVE 12/22/2022 2222   LEUKOCYTESUR NEGATIVE 12/22/2022 2222   LEUKOCYTESUR 1+ 06/26/2014 2238   Sepsis Labs: Invalid input(s): "PROCALCITONIN", "LACTICIDVEN"   SIGNED:  Mercy Riding, MD  [facility] 12/25/2022, 4:41 PM

## 2022-12-28 LAB — SURGICAL PATHOLOGY

## 2023-01-06 ENCOUNTER — Other Ambulatory Visit: Payer: Self-pay

## 2023-01-06 ENCOUNTER — Encounter: Payer: Self-pay | Admitting: Emergency Medicine

## 2023-01-06 ENCOUNTER — Emergency Department
Admission: EM | Admit: 2023-01-06 | Discharge: 2023-01-06 | Disposition: A | Discharge status: Home / Self Care | Payer: Medicaid Other | Attending: Emergency Medicine | Admitting: Emergency Medicine

## 2023-01-06 DIAGNOSIS — E875 Hyperkalemia: Secondary | ICD-10-CM | POA: Diagnosis not present

## 2023-01-06 DIAGNOSIS — E860 Dehydration: Secondary | ICD-10-CM | POA: Insufficient documentation

## 2023-01-06 DIAGNOSIS — R110 Nausea: Secondary | ICD-10-CM | POA: Diagnosis present

## 2023-01-06 DIAGNOSIS — F419 Anxiety disorder, unspecified: Secondary | ICD-10-CM | POA: Diagnosis not present

## 2023-01-06 DIAGNOSIS — E871 Hypo-osmolality and hyponatremia: Secondary | ICD-10-CM | POA: Diagnosis not present

## 2023-01-06 DIAGNOSIS — F411 Generalized anxiety disorder: Secondary | ICD-10-CM

## 2023-01-06 LAB — CBC
HCT: 41.2 % (ref 39.0–52.0)
Hemoglobin: 13 g/dL (ref 13.0–17.0)
MCH: 28.3 pg (ref 26.0–34.0)
MCHC: 31.6 g/dL (ref 30.0–36.0)
MCV: 89.6 fL (ref 80.0–100.0)
Platelets: 295 10*3/uL (ref 150–400)
RBC: 4.6 MIL/uL (ref 4.22–5.81)
RDW: 15.6 % — ABNORMAL HIGH (ref 11.5–15.5)
WBC: 10.4 10*3/uL (ref 4.0–10.5)
nRBC: 0 % (ref 0.0–0.2)

## 2023-01-06 LAB — COMPREHENSIVE METABOLIC PANEL
ALT: 16 U/L (ref 0–44)
AST: 16 U/L (ref 15–41)
Albumin: 4.1 g/dL (ref 3.5–5.0)
Alkaline Phosphatase: 75 U/L (ref 38–126)
Anion gap: 6 (ref 5–15)
BUN: 27 mg/dL — ABNORMAL HIGH (ref 8–23)
CO2: 23 mmol/L (ref 22–32)
Calcium: 9.4 mg/dL (ref 8.9–10.3)
Chloride: 103 mmol/L (ref 98–111)
Creatinine, Ser: 1.37 mg/dL — ABNORMAL HIGH (ref 0.61–1.24)
GFR, Estimated: 58 mL/min — ABNORMAL LOW (ref >60)
Glucose, Bld: 114 mg/dL — ABNORMAL HIGH (ref 70–99)
Potassium: 5.4 mmol/L — ABNORMAL HIGH (ref 3.5–5.1)
Sodium: 132 mmol/L — ABNORMAL LOW (ref 135–145)
Total Bilirubin: 0.5 mg/dL (ref 0.3–1.2)
Total Protein: 7.6 g/dL (ref 6.5–8.1)

## 2023-01-06 LAB — LIPASE, BLOOD: Lipase: 38 U/L (ref 11–51)

## 2023-01-06 MED ORDER — HYDROXYZINE HCL 25 MG PO TABS
25.0000 mg | ORAL_TABLET | Freq: Once | ORAL | Status: AC
  Administered 2023-01-06: 25 mg via ORAL
  Filled 2023-01-06: qty 1

## 2023-01-06 MED ORDER — HYDROXYZINE HCL 25 MG PO TABS
25.0000 mg | ORAL_TABLET | Freq: Three times a day (TID) | ORAL | 0 refills | Status: DC | PRN
  Filled 2023-01-06: qty 30, 10d supply, fill #0

## 2023-01-06 MED ORDER — HYDROXYZINE HCL 25 MG PO TABS: 25.0000 mg | ORAL_TABLET | Freq: Three times a day (TID) | ORAL | 0 refills | Status: DC | PRN

## 2023-01-06 MED ORDER — SODIUM CHLORIDE 0.9 % IV BOLUS
1000.0000 mL | Freq: Once | INTRAVENOUS | Status: AC
  Administered 2023-01-06: 1000 mL via INTRAVENOUS

## 2023-01-06 NOTE — ED Triage Notes (Signed)
Pt states he feels terrible and like he did last time he was here with high potassium. Endorses nausea and tremors for a few days. Denies etoh use.

## 2023-01-06 NOTE — Discharge Instructions (Signed)
Please seek medical attention for any high fevers, chest pain, shortness of breath, change in behavior, persistent vomiting, bloody stool or any other new or concerning symptoms.  

## 2023-01-06 NOTE — ED Provider Notes (Signed)
[facility] Provider Note    Event Date/Time   First MD Initiated Contact with Patient 01/06/23 1719     (approximate)   History   Nausea and Tremors   HPI  Miguel [name] is a 62 y.o. male who presents to the emergency department with concerns for anxiety, insomnia, lack of appetite.  He states he does not feel well.  He says he feels like things are getting worse for him.  He is quite tearful during the exam.  He states that this has been going on for a long time.  He has tried Ativan and Atarax with minimal relief.     Physical Exam   Triage Vital Signs: ED Triage Vitals [01/06/23 1410]  Enc Vitals Group     BP 126/89     Pulse Rate 77     Resp 20     Temp 97.7 F (36.5 C)     Temp Source Oral     SpO2 100 %     Weight 189 lb 9.5 oz (86 kg)     Height 6' (1.829 m)     Head Circumference      Peak Flow      Pain Score 8     Pain Loc      Pain Edu?      Excl. in [place]?     Most recent vital signs: Vitals:   01/06/23 1704 01/06/23 1705  BP: 119/81   Pulse: 77 77  Resp: 16   Temp:    SpO2: 97% 100%   General: Awake, alert, tearful. CV:  Good peripheral perfusion.  Resp:  Normal effort.  Abd:  No distention.    ED Results / Procedures / Treatments   Labs (all labs ordered are listed, but only abnormal results are displayed) Labs Reviewed  COMPREHENSIVE METABOLIC PANEL - Abnormal; Notable for the following components:      Result Value   Sodium 132 (*)    Potassium 5.4 (*)    Glucose, Bld 114 (*)    BUN 27 (*)    Creatinine, Ser 1.37 (*)    GFR, Estimated 58 (*)    All other components within normal limits  CBC - Abnormal; Notable for the following components:   RDW 15.6 (*)    All other components within normal limits  LIPASE, BLOOD     EKG  I, Nance Pear, attending physician, personally viewed and interpreted this EKG  EKG Time: 1417 Rate: 76 Rhythm: normal sinus rhythm Axis: left axis deviation Intervals:  qtc 420 QRS: narrow ST changes: no st elevation Impression: abnormal ekg  RADIOLOGY None   PROCEDURES:  Critical Care performed: No  Procedures   MEDICATIONS ORDERED IN ED: Medications - No data to display   IMPRESSION / MDM / [place] / ED COURSE  I reviewed the triage vital signs and the nursing notes.                              Differential diagnosis includes, but is not limited to, anxiety, electrolyte abnormality  Patient's presentation is most consistent with acute presentation with potential threat to life or bodily function.  Patient presented to the emergency department today because of concerns for not feeling well.  On exam patient appears quite anxious.  Blood work here is concerning for some dehydration with a low sodium. Potassium was slightly elevated. Patient was given IV  fluids. Additionally did give atarax. Did have a discussion with the patient. I do have concern for depression given the patient's symptoms. Discussed importance of follow up. Will provide both PCP and consoling follow up information.   FINAL CLINICAL IMPRESSION(S) / ED DIAGNOSES   Final diagnoses:  Dehydration  Anxiety    Note:  This document was prepared using Dragon voice recognition software and may include unintentional dictation errors.    Nance Pear, MD 01/06/23 2038

## 2023-01-07 ENCOUNTER — Other Ambulatory Visit: Payer: Self-pay

## 2023-01-18 ENCOUNTER — Encounter: Payer: Self-pay | Admitting: Emergency Medicine

## 2023-01-18 ENCOUNTER — Other Ambulatory Visit: Payer: Self-pay

## 2023-01-18 ENCOUNTER — Inpatient Hospital Stay
Admission: EM | Admit: 2023-01-18 | Discharge: 2023-01-20 | DRG: 641 | Disposition: A | Discharge status: Home / Self Care | Payer: Medicaid Other | Source: Ambulatory Visit | Attending: Internal Medicine | Admitting: Internal Medicine

## 2023-01-18 DIAGNOSIS — N179 Acute kidney failure, unspecified: Secondary | ICD-10-CM | POA: Diagnosis present

## 2023-01-18 DIAGNOSIS — F411 Generalized anxiety disorder: Secondary | ICD-10-CM | POA: Diagnosis present

## 2023-01-18 DIAGNOSIS — G8929 Other chronic pain: Secondary | ICD-10-CM | POA: Diagnosis present

## 2023-01-18 DIAGNOSIS — K219 Gastro-esophageal reflux disease without esophagitis: Secondary | ICD-10-CM | POA: Diagnosis present

## 2023-01-18 DIAGNOSIS — M109 Gout, unspecified: Secondary | ICD-10-CM | POA: Diagnosis present

## 2023-01-18 DIAGNOSIS — R300 Dysuria: Secondary | ICD-10-CM | POA: Diagnosis present

## 2023-01-18 DIAGNOSIS — Z9049 Acquired absence of other specified parts of digestive tract: Secondary | ICD-10-CM

## 2023-01-18 DIAGNOSIS — J45909 Unspecified asthma, uncomplicated: Secondary | ICD-10-CM | POA: Diagnosis present

## 2023-01-18 DIAGNOSIS — N401 Enlarged prostate with lower urinary tract symptoms: Secondary | ICD-10-CM | POA: Diagnosis present

## 2023-01-18 DIAGNOSIS — F429 Obsessive-compulsive disorder, unspecified: Secondary | ICD-10-CM | POA: Diagnosis present

## 2023-01-18 DIAGNOSIS — F331 Major depressive disorder, recurrent, moderate: Secondary | ICD-10-CM | POA: Diagnosis present

## 2023-01-18 DIAGNOSIS — F332 Major depressive disorder, recurrent severe without psychotic features: Secondary | ICD-10-CM | POA: Diagnosis present

## 2023-01-18 DIAGNOSIS — Z87442 Personal history of urinary calculi: Secondary | ICD-10-CM

## 2023-01-18 DIAGNOSIS — E871 Hypo-osmolality and hyponatremia: Secondary | ICD-10-CM | POA: Diagnosis present

## 2023-01-18 DIAGNOSIS — Z87891 Personal history of nicotine dependence: Secondary | ICD-10-CM

## 2023-01-18 DIAGNOSIS — Z5986 Financial insecurity: Secondary | ICD-10-CM

## 2023-01-18 DIAGNOSIS — F1994 Other psychoactive substance use, unspecified with psychoactive substance-induced mood disorder: Secondary | ICD-10-CM | POA: Diagnosis present

## 2023-01-18 DIAGNOSIS — Z803 Family history of malignant neoplasm of breast: Secondary | ICD-10-CM

## 2023-01-18 DIAGNOSIS — R350 Frequency of micturition: Secondary | ICD-10-CM | POA: Diagnosis present

## 2023-01-18 DIAGNOSIS — Z8719 Personal history of other diseases of the digestive system: Secondary | ICD-10-CM | POA: Diagnosis not present

## 2023-01-18 DIAGNOSIS — I10 Essential (primary) hypertension: Secondary | ICD-10-CM | POA: Diagnosis present

## 2023-01-18 DIAGNOSIS — R1011 Right upper quadrant pain: Secondary | ICD-10-CM | POA: Diagnosis present

## 2023-01-18 DIAGNOSIS — Z818 Family history of other mental and behavioral disorders: Secondary | ICD-10-CM | POA: Diagnosis not present

## 2023-01-18 DIAGNOSIS — E875 Hyperkalemia: Secondary | ICD-10-CM | POA: Diagnosis not present

## 2023-01-18 DIAGNOSIS — E8721 Acute metabolic acidosis: Secondary | ICD-10-CM | POA: Diagnosis present

## 2023-01-18 DIAGNOSIS — F1011 Alcohol abuse, in remission: Secondary | ICD-10-CM | POA: Diagnosis present

## 2023-01-18 DIAGNOSIS — R3589 Other polyuria: Secondary | ICD-10-CM | POA: Insufficient documentation

## 2023-01-18 DIAGNOSIS — F4010 Social phobia, unspecified: Secondary | ICD-10-CM | POA: Diagnosis present

## 2023-01-18 LAB — CBG MONITORING, ED
Glucose-Capillary: 115 mg/dL — ABNORMAL HIGH (ref 70–99)
Glucose-Capillary: 143 mg/dL — ABNORMAL HIGH (ref 70–99)

## 2023-01-18 LAB — TROPONIN I (HIGH SENSITIVITY)
Troponin I (High Sensitivity): <2 ng/L (ref <18)
Troponin I (High Sensitivity): <2 ng/L (ref <18)

## 2023-01-18 LAB — COMPREHENSIVE METABOLIC PANEL
ALT: 12 U/L (ref 0–44)
AST: 21 U/L (ref 15–41)
Albumin: 3.9 g/dL (ref 3.5–5.0)
Alkaline Phosphatase: 69 U/L (ref 38–126)
Anion gap: 8 (ref 5–15)
BUN: 37 mg/dL — ABNORMAL HIGH (ref 8–23)
CO2: 15 mmol/L — ABNORMAL LOW (ref 22–32)
Calcium: 9.2 mg/dL (ref 8.9–10.3)
Chloride: 107 mmol/L (ref 98–111)
Creatinine, Ser: 1.79 mg/dL — ABNORMAL HIGH (ref 0.61–1.24)
GFR, Estimated: 42 mL/min — ABNORMAL LOW (ref >60)
Glucose, Bld: 130 mg/dL — ABNORMAL HIGH (ref 70–99)
Potassium: 6 mmol/L — ABNORMAL HIGH (ref 3.5–5.1)
Sodium: 130 mmol/L — ABNORMAL LOW (ref 135–145)
Total Bilirubin: 1 mg/dL (ref 0.3–1.2)
Total Protein: 7.2 g/dL (ref 6.5–8.1)

## 2023-01-18 LAB — CBC WITH DIFFERENTIAL/PLATELET
Abs Immature Granulocytes: 0.06 10*3/uL (ref 0.00–0.07)
Basophils Absolute: 0 10*3/uL (ref 0.0–0.1)
Basophils Relative: 0 %
Eosinophils Absolute: 0.1 10*3/uL (ref 0.0–0.5)
Eosinophils Relative: 1 %
HCT: 41.1 % (ref 39.0–52.0)
Hemoglobin: 12.4 g/dL — ABNORMAL LOW (ref 13.0–17.0)
Immature Granulocytes: 1 %
Lymphocytes Relative: 14 %
Lymphs Abs: 1.4 10*3/uL (ref 0.7–4.0)
MCH: 28.8 pg (ref 26.0–34.0)
MCHC: 30.2 g/dL (ref 30.0–36.0)
MCV: 95.6 fL (ref 80.0–100.0)
Monocytes Absolute: 1 10*3/uL (ref 0.1–1.0)
Monocytes Relative: 10 %
Neutro Abs: 7.3 10*3/uL (ref 1.7–7.7)
Neutrophils Relative %: 74 %
Platelets: 204 10*3/uL (ref 150–400)
RBC: 4.3 MIL/uL (ref 4.22–5.81)
RDW: 15 % (ref 11.5–15.5)
WBC: 9.9 10*3/uL (ref 4.0–10.5)
nRBC: 0 % (ref 0.0–0.2)

## 2023-01-18 LAB — LIPASE, BLOOD: Lipase: 44 U/L (ref 11–51)

## 2023-01-18 MED ORDER — POLYETHYLENE GLYCOL 3350 17 GM/SCOOP PO POWD: 17.0000 g | Freq: Two times a day (BID) | ORAL | Status: DC | PRN

## 2023-01-18 MED ORDER — LORAZEPAM 0.5 MG PO TABS
0.5000 mg | ORAL_TABLET | Freq: Four times a day (QID) | ORAL | Status: DC | PRN
  Administered 2023-01-19 (×2): 0.5 mg via ORAL
  Filled 2023-01-18 (×2): qty 1

## 2023-01-18 MED ORDER — LORAZEPAM 1 MG PO TABS
0.5000 mg | ORAL_TABLET | Freq: Once | ORAL | Status: AC
  Administered 2023-01-18: 0.5 mg via ORAL
  Filled 2023-01-18: qty 1

## 2023-01-18 MED ORDER — OXYMETAZOLINE HCL 0.05 % NA SOLN
1.0000 | Freq: Two times a day (BID) | NASAL | Status: DC | PRN
  Administered 2023-01-18: 1 via NASAL
  Filled 2023-01-18: qty 15
  Filled 2023-01-18: qty 30

## 2023-01-18 MED ORDER — ACETAMINOPHEN 650 MG RE SUPP: 650.0000 mg | Freq: Four times a day (QID) | RECTAL | Status: DC | PRN

## 2023-01-18 MED ORDER — PANTOPRAZOLE SODIUM 40 MG PO TBEC
40.0000 mg | DELAYED_RELEASE_TABLET | Freq: Every day | ORAL | Status: DC
  Administered 2023-01-19 – 2023-01-20 (×2): 40 mg via ORAL
  Filled 2023-01-18 (×2): qty 1

## 2023-01-18 MED ORDER — SENNOSIDES-DOCUSATE SODIUM 8.6-50 MG PO TABS: 1.0000 | ORAL_TABLET | Freq: Every evening | ORAL | Status: DC | PRN

## 2023-01-18 MED ORDER — LORAZEPAM 1 MG PO TABS: 0.5000 mg | ORAL_TABLET | Freq: Four times a day (QID) | ORAL | Status: DC | PRN

## 2023-01-18 MED ORDER — FLUTICASONE FUROATE-VILANTEROL 200-25 MCG/ACT IN AEPB
1.0000 | INHALATION_SPRAY | Freq: Every day | RESPIRATORY_TRACT | Status: DC
  Administered 2023-01-19 – 2023-01-20 (×2): 1 via RESPIRATORY_TRACT
  Filled 2023-01-18: qty 28

## 2023-01-18 MED ORDER — HYDROXYZINE HCL 25 MG PO TABS
25.0000 mg | ORAL_TABLET | Freq: Three times a day (TID) | ORAL | 1 refills | Status: DC | PRN
  Filled 2023-01-18: qty 90, 30d supply, fill #0

## 2023-01-18 MED ORDER — SODIUM CHLORIDE 0.9 % IV BOLUS: 500.0000 mL | Freq: Once | INTRAVENOUS | Status: DC

## 2023-01-18 MED ORDER — VITAMIN C 500 MG PO TABS
500.0000 mg | ORAL_TABLET | Freq: Every day | ORAL | Status: DC
  Administered 2023-01-19 – 2023-01-20 (×2): 500 mg via ORAL
  Filled 2023-01-18 (×2): qty 1

## 2023-01-18 MED ORDER — VITAMIN B-12 1000 MCG PO TABS
1000.0000 ug | ORAL_TABLET | Freq: Every day | ORAL | Status: DC
  Administered 2023-01-19 – 2023-01-20 (×2): 1000 ug via ORAL
  Filled 2023-01-18 (×2): qty 1

## 2023-01-18 MED ORDER — SODIUM CHLORIDE 0.9 % IV BOLUS
1000.0000 mL | Freq: Once | INTRAVENOUS | Status: AC
  Administered 2023-01-18: 1000 mL via INTRAVENOUS

## 2023-01-18 MED ORDER — THIAMINE HCL 100 MG PO TABS
100.0000 mg | ORAL_TABLET | Freq: Every day | ORAL | Status: DC
  Administered 2023-01-19 – 2023-01-20 (×2): 100 mg via ORAL
  Filled 2023-01-18 (×4): qty 1

## 2023-01-18 MED ORDER — ONDANSETRON HCL 4 MG/2ML IJ SOLN: 4.0000 mg | Freq: Four times a day (QID) | INTRAMUSCULAR | Status: DC | PRN

## 2023-01-18 MED ORDER — FOLIC ACID 1 MG PO TABS
1.0000 mg | ORAL_TABLET | Freq: Every day | ORAL | Status: DC
  Administered 2023-01-19 – 2023-01-20 (×2): 1 mg via ORAL
  Filled 2023-01-18 (×2): qty 1

## 2023-01-18 MED ORDER — HEPARIN SODIUM (PORCINE) 5000 UNIT/ML IJ SOLN
5000.0000 [IU] | Freq: Three times a day (TID) | INTRAMUSCULAR | Status: DC
  Administered 2023-01-18 – 2023-01-20 (×5): 5000 [IU] via SUBCUTANEOUS
  Filled 2023-01-18 (×4): qty 1

## 2023-01-18 MED ORDER — SODIUM ZIRCONIUM CYCLOSILICATE 10 G PO PACK
10.0000 g | PACK | Freq: Every day | ORAL | Status: DC
  Administered 2023-01-18 – 2023-01-20 (×3): 10 g via ORAL
  Filled 2023-01-18 (×3): qty 1

## 2023-01-18 MED ORDER — HYDROXYZINE HCL 25 MG PO TABS
25.0000 mg | ORAL_TABLET | Freq: Three times a day (TID) | ORAL | Status: DC | PRN
  Administered 2023-01-18: 25 mg via ORAL
  Filled 2023-01-18: qty 1

## 2023-01-18 MED ORDER — HYDRALAZINE HCL 20 MG/ML IJ SOLN: 5.0000 mg | Freq: Three times a day (TID) | INTRAMUSCULAR | Status: DC | PRN

## 2023-01-18 MED ORDER — INSULIN ASPART 100 UNIT/ML IV SOLN
5.0000 [IU] | Freq: Once | INTRAVENOUS | Status: AC
  Administered 2023-01-18: 5 [IU] via INTRAVENOUS
  Filled 2023-01-18: qty 0.05

## 2023-01-18 MED ORDER — DEXTROSE 50 % IV SOLN
1.0000 | Freq: Once | INTRAVENOUS | Status: AC
  Administered 2023-01-18: 50 mL via INTRAVENOUS
  Filled 2023-01-18: qty 50

## 2023-01-18 MED ORDER — ACETAMINOPHEN 325 MG PO TABS
650.0000 mg | ORAL_TABLET | Freq: Four times a day (QID) | ORAL | Status: DC | PRN
  Administered 2023-01-19 (×2): 650 mg via ORAL
  Filled 2023-01-18 (×2): qty 2

## 2023-01-18 MED ORDER — ONDANSETRON HCL 4 MG PO TABS: 4.0000 mg | ORAL_TABLET | Freq: Four times a day (QID) | ORAL | Status: DC | PRN

## 2023-01-18 MED ORDER — SODIUM CHLORIDE 0.9 % IV SOLN: INTRAVENOUS | Status: AC

## 2023-01-18 NOTE — Assessment & Plan Note (Addendum)
Presumed secondary to BPH - Check UA

## 2023-01-18 NOTE — Hospital Course (Addendum)
Miguel [name] is a 62 year old male with hypertension, anxiety, gerd, depression, anxiety, who presents for chief concern of generalized weakness.  Vitals in the ED showed temperature of 98, respiration rate of 16, heart rate of 96, blood pressure of 108/81, spO2 is 99.   Serum sodium 130, potassium 4.0, chloride 107, bicarb 15, BUN of 37, nonfasting blood glucose 130, serum creatinine of 1.79, EGFR 42.  WBC 9.9, hemoglobin 12.4, platelets of 204.  High sensitivity troponin was less than 2, and on repeat was also less than 2.  ED treatment: Lokelma 10 g p.o. one-time dose, sodium chloride 1 L bolus.

## 2023-01-18 NOTE — Assessment & Plan Note (Signed)
-   Status post Lokelma 10 g p.o. one-time dose, D50 and insulin aspart 5 units IV one-time dose - BMP in the a.m.

## 2023-01-18 NOTE — Assessment & Plan Note (Signed)
Asymptomatic - Presumed secondary to prerenal in setting of poor p.o. intake - Status post sodium chloride 1 L bolus - Sodium chloride 100 mL per hour, 12 hours ordered - BMP in the a.m.

## 2023-01-18 NOTE — ED Notes (Signed)
ED TO INPATIENT HANDOFF REPORT  ED Nurse Name and Phone #: [phone]   S Name/Age/Gender Miguel [name] 62 y.o. male Room/Bed: ED50A/ED50A  Code Status   Code Status: Full Code  Home/SNF/Other Home Patient oriented to: self, place, time, and situation Is this baseline? Yes   Triage Complete: Triage complete  Chief Complaint Hyperkalemia [E87.5]  Triage Note Patient to ED via POV for generalized weakness. Was seen at [facility]- creatinine 1.8, potassium 5.4 and sodium 131. States he does not feel like eating and can't sleep.   Allergies No Known Allergies  Level of Care/Admitting Diagnosis ED Disposition     ED Disposition  Admit   Condition  --   Comment  Hospital Area: [place] [[id number]]  Level of Care: Telemetry Medical [104]  Covid Evaluation: Asymptomatic - no recent exposure (last 10 days) testing not required  Diagnosis: Hyperkalemia [172194]  Admitting Physician: COX, Briant Cedar [id number]  Attending Physician: COX, AMY N [id number]  Certification:: I certify this patient will need inpatient services for at least 2 midnights  Estimated Length of Stay: 3          B Medical/Surgery History 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    OCD (obsessive compulsive disorder)    Renal colic    Past Surgical History:  Procedure Laterality Date   CHOLECYSTECTOMY  2012   COLONOSCOPY     COLONOSCOPY N/A 12/24/2022   Procedure: COLONOSCOPY;  Surgeon: [name], MD;  Location: [facility] ENDOSCOPY;  Service: Endoscopy;  Laterality: N/A;   COLONOSCOPY WITH PROPOFOL N/A 05/28/2020   Procedure: COLONOSCOPY WITH PROPOFOL;  Surgeon: [name], MD;  Location: [facility] ENDOSCOPY;  Service: Gastroenterology;   Laterality: N/A;   ESOPHAGOGASTRODUODENOSCOPY N/A 12/24/2022   Procedure: ESOPHAGOGASTRODUODENOSCOPY (EGD);  Surgeon: [name], MD;  Location: [facility] ENDOSCOPY;  Service: Endoscopy;  Laterality: N/A;   EXTRACORPOREAL SHOCK WAVE LITHOTRIPSY Left 01/12/2019   Procedure: EXTRACORPOREAL SHOCK WAVE LITHOTRIPSY (ESWL);  Surgeon: Billey Co, MD;  Location: [facility] ORS;  Service: Urology;  Laterality: Left;   UPPER GI ENDOSCOPY       A IV Location/Drains/Wounds Patient Lines/Drains/Airways Status     Active Line/Drains/Airways     Name Placement date Placement time Site Days   Peripheral IV 01/18/23 22 G Right Antecubital 01/18/23  1701  Antecubital  less than 1            Intake/Output Last 24 hours  Intake/Output Summary (Last 24 hours) at 01/18/2023 2152 Last data filed at 01/18/2023 1830 Gross per 24 hour  Intake 1000 ml  Output --  Net 1000 ml    Labs/Imaging Results for orders placed or performed during the hospital encounter of 01/18/23 (from the past 48 hour(s))  CBC with Differential     Status: Abnormal   Collection Time: 01/18/23  4:49 PM  Result Value Ref Range   WBC 9.9 4.0 - 10.5 K/uL   RBC 4.30 4.22 - 5.81 MIL/uL   Hemoglobin 12.4 (L) 13.0 - 17.0 g/dL   HCT 41.1 39.0 - 52.0 %   MCV 95.6 80.0 - 100.0 fL   MCH 28.8  26.0 - 34.0 pg   MCHC 30.2 30.0 - 36.0 g/dL   RDW 15.0 11.5 - 15.5 %   Platelets 204 150 - 400 K/uL   nRBC 0.0 0.0 - 0.2 %   Neutrophils Relative % 74 %   Neutro Abs 7.3 1.7 - 7.7 K/uL   Lymphocytes Relative 14 %   Lymphs Abs 1.4 0.7 - 4.0 K/uL   Monocytes Relative 10 %   Monocytes Absolute 1.0 0.1 - 1.0 K/uL   Eosinophils Relative 1 %   Eosinophils Absolute 0.1 0.0 - 0.5 K/uL   Basophils Relative 0 %   Basophils Absolute 0.0 0.0 - 0.1 K/uL   Immature Granulocytes 1 %   Abs Immature Granulocytes 0.06 0.00 - 0.07 K/uL    Comment: Performed at [facility], [address] [place], [place] [address]  Comprehensive metabolic  panel     Status: Abnormal   Collection Time: 01/18/23  4:49 PM  Result Value Ref Range   Sodium 130 (L) 135 - 145 mmol/L   Potassium 6.0 (H) 3.5 - 5.1 mmol/L    Comment: HEMOLYSIS AT THIS LEVEL MAY AFFECT RESULT   Chloride 107 98 - 111 mmol/L   CO2 15 (L) 22 - 32 mmol/L   Glucose, Bld 130 (H) 70 - 99 mg/dL    Comment: Glucose reference range applies only to samples taken after fasting for at least 8 hours.   BUN 37 (H) 8 - 23 mg/dL   Creatinine, Ser 1.79 (H) 0.61 - 1.24 mg/dL   Calcium 9.2 8.9 - 10.3 mg/dL   Total Protein 7.2 6.5 - 8.1 g/dL   Albumin 3.9 3.5 - 5.0 g/dL   AST 21 15 - 41 U/L    Comment: HEMOLYSIS AT THIS LEVEL MAY AFFECT RESULT   ALT 12 0 - 44 U/L    Comment: HEMOLYSIS AT THIS LEVEL MAY AFFECT RESULT   Alkaline Phosphatase 69 38 - 126 U/L   Total Bilirubin 1.0 0.3 - 1.2 mg/dL    Comment: HEMOLYSIS AT THIS LEVEL MAY AFFECT RESULT   GFR, Estimated 42 (L) >60 mL/min    Comment: (NOTE) Calculated using the CKD-EPI Creatinine Equation (2021)    Anion gap 8 5 - 15    Comment: Performed at [facility], [address] [place], [place] [address]  Lipase, blood     Status: None   Collection Time: 01/18/23  4:49 PM  Result Value Ref Range   Lipase 44 11 - 51 U/L    Comment: Performed at [facility], [place], [place] [address]  Troponin I (High Sensitivity)     Status: None   Collection Time: 01/18/23  4:49 PM  Result Value Ref Range   Troponin I (High Sensitivity) <2 <18 ng/L    Comment: (NOTE) Elevated high sensitivity troponin I (hsTnI) values and significant  changes across serial measurements may suggest ACS but many other  chronic and acute conditions are known to elevate hsTnI results.  Refer to the Links section for chest pain algorithms and additional  guidance. Performed at [facility], [place], [place] [address]   Troponin I (High Sensitivity)     Status: None   Collection Time: 01/18/23   6:25 PM  Result Value Ref Range   Troponin I (High Sensitivity) <2 <18 ng/L    Comment: (NOTE) Elevated high sensitivity troponin I (hsTnI) values and significant  changes across serial measurements may suggest ACS but many other  chronic and acute conditions are known to elevate hsTnI results.  Refer to the "Links" section for chest pain algorithms and additional  guidance. Performed at [facility], [address] [place], [place] [address]   CBG monitoring, ED     Status: Abnormal   Collection Time: 01/18/23  9:00 PM  Result Value Ref Range   Glucose-Capillary 115 (H) 70 - 99 mg/dL    Comment: Glucose reference range applies only to samples taken after fasting for at least 8 hours.   No results found.  Pending Labs Unresulted Labs (From admission, onward)     Start     Ordered   01/19/23 [id number]  Basic metabolic panel  Tomorrow morning,   STAT        01/18/23 2121   01/19/23 0500  CBC  Tomorrow morning,   STAT        01/18/23 2121   01/18/23 2142  Ethanol  Once,   R        01/18/23 2141   01/18/23 2123  Magnesium  Once,   R        01/18/23 2122   01/18/23 2012  CK  Once,   URGENT        01/18/23 2011            Vitals/Pain Today's Vitals   01/18/23 1533 01/18/23 1650 01/18/23 1702 01/18/23 2103  BP: 116/76   108/81  Pulse: 87   96  Resp: 18   16  Temp: 98.4 F (36.9 C)   98 F (36.7 C)  TempSrc: Oral   Oral  SpO2: 100%   99%  Weight:  86 kg 86 kg   Height:  6' (1.829 m) 6' (1.829 m)   PainSc:        Isolation Precautions No active isolations  Medications Medications  sodium zirconium cyclosilicate (LOKELMA) packet 10 g (10 g Oral Given 01/18/23 1858)  acetaminophen (TYLENOL) tablet 650 mg (has no administration in time range)    Or  acetaminophen (TYLENOL) suppository 650 mg (has no administration in time range)  ondansetron (ZOFRAN) tablet 4 mg (has no administration in time range)    Or  ondansetron (ZOFRAN) injection 4 mg (has no  administration in time range)  heparin injection 5,000 Units (has no administration in time range)  senna-docusate (Senokot-S) tablet 1 tablet (has no administration in time range)  pantoprazole (PROTONIX) EC tablet 40 mg (has no administration in time range)  polyethylene glycol powder (GLYCOLAX/MIRALAX) container 17 g (has no administration in time range)  cyanocobalamin (VITAMIN B12) tablet 1,000 mcg (has no administration in time range)  folic acid (FOLVITE) tablet 1 mg (has no administration in time range)  thiamine (VITAMIN B1) tablet 100 mg (has no administration in time range)  fluticasone furoate-vilanterol (BREO ELLIPTA) 200-25 MCG/ACT 1 puff (has no administration in time range)  ascorbic acid (VITAMIN C) tablet 500 mg (has no administration in time range)  0.9 %  sodium chloride infusion (has no administration in time range)  LORazepam (ATIVAN) tablet 0.5 mg (has no administration in time range)  sodium chloride 0.9 % bolus 1,000 mL (0 mLs Intravenous Stopped 01/18/23 1830)  insulin aspart (novoLOG) injection 5 Units (5 Units Intravenous Given 01/18/23 2138)    And  dextrose 50 % solution 50 mL (50 mLs Intravenous Given 01/18/23 2139)  LORazepam (ATIVAN) tablet 0.5 mg (0.5 mg Oral Given 01/18/23 2103)    Mobility walks     Focused Assessments  R Recommendations: See Admitting Provider Note  Report given to:   Additional Notes: please call with any questions

## 2023-01-18 NOTE — Assessment & Plan Note (Signed)
PPI ?

## 2023-01-18 NOTE — ED Notes (Signed)
Called lab to add on CK. Spoke with michelle who said they would add it on.

## 2023-01-18 NOTE — ED Notes (Signed)
Pt states low appetite, weakness, and insomnia

## 2023-01-18 NOTE — ED Triage Notes (Signed)
Patient to ED via POV for generalized weakness. Was seen at [facility]- creatinine 1.8, potassium 5.4 and sodium 131. States he does not feel like eating and can't sleep.

## 2023-01-18 NOTE — Assessment & Plan Note (Signed)
-   Hydralazine 5 mg q8h prn sbp > 175, 5 days ordered

## 2023-01-18 NOTE — H&P (Addendum)
History and Physical   Miguel [name] [id number] DOB: [birth year] DOA: 01/18/2023  PCP: Pcp, No  Outpatient Specialists: Dr. Haig Prophet, gastroenterology Patient coming from: Home  I have personally briefly reviewed patient's old medical records in [place].  Chief Concern: Generalized weakness  HPI: [name] is a 62 year old male with hypertension, anxiety, gerd, depression, anxiety, who presents for chief concern of generalized weakness.  Vitals in the ED showed temperature of 98, respiration rate of 16, heart rate of 96, blood pressure of 108/81, spO2 is 99.   Serum sodium 130, potassium 4.0, chloride 107, bicarb 15, BUN of 37, nonfasting blood glucose 130, serum creatinine of 1.79, EGFR 42.  WBC 9.9, hemoglobin 12.4, platelets of 204.  High sensitivity troponin was less than 2, and on repeat was also less than 2.  ED treatment: Lokelma 10 g p.o. one-time dose, sodium chloride 1 L bolus. ----------------------------- At bedside, he is able to tell me his name, age, current calendar, current location.   He was on his way to [place] because he ran out of his hydroxyzine.  He states he was feeling poorly, and generalized weakness which is his baseline self since being discharged from the hospital on 12/25/2022.  He was seen at [facility], due to patient not feeling. Patient was found to have abnormal labs, and sent to the ED. He reports buspar does not work for him.   He denies etoh use. He reports that he has increased urination at night. He denies diarrhea, changes to his bowel habits. He endorses dysuria and polyuria.   Social history: He lives at home currently by himself as his wife is in rehab.  He denies current EtOH use and states that he quit drinking about 1.5 years ago.  He denies recreational drug use.  He denies tobacco use.  He is retired and formerly worked as a Pension scheme manager.  ROS: Constitutional: no weight change, no fever ENT/Mouth: no sore throat, no  rhinorrhea Eyes: no eye pain, no vision changes Cardiovascular: no chest pain, no dyspnea,  no edema, no palpitations Respiratory: no cough, no sputum, no wheezing Gastrointestinal: no nausea, no vomiting, no diarrhea, no constipation Genitourinary: no urinary incontinence, + dysuria, no hematuria, + noctural polyuria Musculoskeletal: no arthralgias, no myalgias Skin: no skin lesions, no pruritus, Neuro: + weakness, no loss of consciousness, no syncope Psych: + anxiety, + depression, + decrease appetite, no suicidal ideation, homicidal ideation Heme/Lymph: no bruising, no bleeding  ED Course: Discussed with emergency medicine provider, patient requiring hospitalization for chief concerns of hyperkalemia.  Assessment/Plan  Principal Problem:   Hyperkalemia Active Problems:   AKI (acute kidney injury) ([place])   Generalized anxiety disorder   Hypertension   Substance induced mood disorder (H[facility])   GERD (gastroesophageal reflux disease)   OCD (obsessive compulsive disorder)   MDD (major depressive disorder), recurrent severe, without psychosis ([place])   Social anxiety disorder   Frequency of urination and polyuria   Assessment and Plan:  * Hyperkalemia - Status post Lokelma 10 g p.o. one-time dose, D50 and insulin aspart 5 units IV one-time dose - BMP in the a.m.  AKI (acute kidney injury) ([place]) Asymptomatic - Presumed secondary to prerenal in setting of poor p.o. intake - Status post sodium chloride 1 L bolus - Sodium chloride 100 mL per hour, 12 hours ordered - BMP in the a.m.  Generalized anxiety disorder With depression - Lorazepam 0.5 mg p.o. every 6 hours as needed for anxiety, 2 days  ordered - Patient denies suicidal and homicidal ideation, he states never - Patient states that his wife is his best friend and he did does not have any friends since she has been hospitalized in rehab - He reports that his car can only go 60 miles an hour and therefore he cannot get on the  highway in order to visit her at the rehab center, he states he does not have a PCP and no one to prescribe him appropriate medications - I counseled patient on going to church, volunteering at a soup kitchen, to develop community.  He nods in agreement. - He believes that nothing goes his way, he states he feels hopeless, and helpless - Behavioral health consulted  Frequency of urination and polyuria Presumed secondary to BPH - Check UA  GERD (gastroesophageal reflux disease) - PPI  Hypertension - Hydralazine 5 mg q8h prn sbp > 175, 5 days ordered  Chart reviewed.   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    OCD (obsessive compulsive disorder)    Renal colic    Past Surgical History:  Procedure Laterality Date   CHOLECYSTECTOMY  2012   COLONOSCOPY     COLONOSCOPY N/A 12/24/2022   Procedure: COLONOSCOPY;  Surgeon: [name], MD;  Location: [facility] ENDOSCOPY;  Service: Endoscopy;  Laterality: N/A;   COLONOSCOPY WITH PROPOFOL N/A 05/28/2020   Procedure: COLONOSCOPY WITH PROPOFOL;  Surgeon: [name], MD;  Location: [facility] ENDOSCOPY;  Service: Gastroenterology;  Laterality: N/A;   ESOPHAGOGASTRODUODENOSCOPY N/A 12/24/2022   Procedure: ESOPHAGOGASTRODUODENOSCOPY (EGD);  Surgeon: [name], MD;  Location: [facility] ENDOSCOPY;  Service: Endoscopy;  Laterality: N/A;   EXTRACORPOREAL  SHOCK WAVE LITHOTRIPSY Left 01/12/2019   Procedure: EXTRACORPOREAL SHOCK WAVE LITHOTRIPSY (ESWL);  Surgeon: Billey Co, MD;  Location: [facility] ORS;  Service: Urology;  Laterality: Left;   UPPER GI ENDOSCOPY     Social History:  reports that he quit smoking about 41 years ago. His smoking use included cigarettes. He has never used smokeless tobacco. He reports that he does not currently use alcohol after a past usage of about 24.0 standard drinks of alcohol per week. He reports that he does not currently use drugs after having used the following drugs: Marijuana.  No Known Allergies Family History  Problem Relation Age of Onset   Alcohol abuse Father    Breast cancer Mother 28   Anxiety disorder Brother    Other Maternal Grandmother        unknown medical history   Other Maternal Grandfather        unknow medical history   Other Paternal Grandmother        unknown medical history   Other Paternal Grandfather        unknown medical history   Healthy Brother    Family history: Family history reviewed and not  pertinent  Prior to Admission medications   Medication Sig Start Date End Date Taking? Authorizing Provider  albuterol (PROAIR HFA) 108 (90 Base) MCG/ACT inhaler Inhale 2 puffs into the lungs once every 4 (four) hours as needed. 03/04/22   Iloabachie, Chioma E, NP  ascorbic acid (VITAMIN C) 500 MG tablet Take by mouth. 08/01/19   [provider]  busPIRone (BUSPAR) 5 MG tablet Take 1 tablet (5 mg total) by mouth 3 (three) times daily. 12/25/22 03/25/23  Mercy Riding, MD  cyanocobalamin 1000 MCG tablet Take 1 tablet (1,000 mcg total) by mouth daily. 12/25/22 03/25/23  Mercy Riding, MD  fluticasone furoate-vilanterol (BREO ELLIPTA) 200-25 MCG/ACT AEPB Inhale one puff by mouth daily 04/30/22   Iloabachie, Chioma E, NP  folic acid (FOLVITE) 1 MG tablet TAKE ONE TABLET BY MOUTH ONCE EVERY DAY. Patient not taking: Reported on 12/22/2022 08/04/22   Iloabachie, Chioma E, NP  hydrOXYzine  (ATARAX) 25 MG tablet Take 1 tablet (25 mg total) by mouth 3 (three) times daily as needed for anxiety. 01/06/23   Nance Pear, MD  hydrOXYzine (ATARAX) 25 MG tablet Take 1 tablet (25 mg total) by mouth 3 (three) times daily as needed for anxiety. 01/18/23     lisinopril (ZESTRIL) 40 MG tablet TAKE ONE TABLET BY MOUTH ONCE EVERY DAY. 10/27/22   Iloabachie, Chioma E, NP  LORazepam (ATIVAN) 0.5 MG tablet Take 1 tablet (0.5 mg total) by mouth at bedtime as needed for anxiety. 12/07/22 12/07/23  Lavonia Drafts, MD  metoprolol tartrate (LOPRESSOR) 25 MG tablet Take 1 tablet (25 mg total) by mouth once daily. 10/27/22   Iloabachie, Chioma E, NP  omeprazole (PRILOSEC) 20 MG capsule Take 20 mg by mouth daily.    [provider]  polyethylene glycol powder (MIRALAX) 17 GM/SCOOP powder Take 17 g by mouth 2 (two) times daily as needed for moderate constipation. 12/25/22   Mercy Riding, MD  senna-docusate (SENOKOT-S) 8.6-50 MG tablet Take 1 tablet by mouth at bedtime as needed for mild constipation. 12/25/22   Mercy Riding, MD  thiamine (VITAMIN B1) 100 MG tablet Take 1 tablet (100 mg total) by mouth daily. Patient not taking: Reported on 12/22/2022 08/04/22 08/04/23  Langston Reusing, NP   Physical Exam: Vitals:   01/18/23 1533 01/18/23 1650 01/18/23 1702 01/18/23 2103  BP: 116/76   108/81  Pulse: 87   96  Resp: 18   16  Temp: 98.4 F (36.9 C)   98 F (36.7 C)  TempSrc: Oral   Oral  SpO2: 100%   99%  Weight:  86 kg 86 kg   Height:  6' (1.829 m) 6' (1.829 m)    Constitutional: appears age appropriate, NAD, calm, comfortable Eyes: PERRL, lids and conjunctivae normal ENMT: Mucous membranes are moist. Posterior pharynx clear of any exudate or lesions. Age-appropriate dentition. Hearing appropriate Neck: normal, supple, no masses, no thyromegaly Respiratory: clear to auscultation bilaterally, no wheezing, no crackles. Normal respiratory effort. No accessory muscle use.  Cardiovascular: Regular  rate and rhythm, no murmurs / rubs / gallops. No extremity edema. 2+ pedal pulses. No carotid bruits.  Abdomen: obese abdomen, no tenderness, no masses palpated, no hepatosplenomegaly. Bowel sounds positive.  Musculoskeletal: no clubbing / cyanosis. No joint deformity upper and lower extremities. Good ROM, no contractures, no atrophy. Normal muscle tone.  Skin: no rashes, lesions, ulcers. No induration Neurologic: Sensation intact. Strength 5/5 in all 4.  Psychiatric: Normal judgment and insight. Alert and oriented x 3.  Normal mood.   EKG: independently reviewed, showing sinus rhythm with rate of 88, QTc 416  Chest x-ray on Admission: not indicated at this time Labs on Admission: I have personally reviewed following labs  CBC: Recent Labs  Lab 01/18/23 1649  WBC 9.9  NEUTROABS 7.3  HGB 12.4*  HCT 41.1  MCV 95.6  PLT 0000000   Basic Metabolic Panel: Recent Labs  Lab 01/18/23 1649  NA 130*  K 6.0*  CL 107  CO2 15*  GLUCOSE 130*  BUN 37*  CREATININE 1.79*  CALCIUM 9.2   GFR: Estimated Creatinine Clearance: 46.4 mL/min (A) (by C-G formula based on SCr of 1.79 mg/dL (H)).  Liver Function Tests: Recent Labs  Lab 01/18/23 1649  AST 21  ALT 12  ALKPHOS 69  BILITOT 1.0  PROT 7.2  ALBUMIN 3.9   Recent Labs  Lab 01/18/23 1649  LIPASE 44   CBG: Recent Labs  Lab 01/18/23 2100 01/18/23 2240  GLUCAP 115* 143*   Urine analysis:    Component Value Date/Time   COLORURINE YELLOW (A) 12/22/2022 2222   APPEARANCEUR CLEAR (A) 12/22/2022 2222   APPEARANCEUR Hazy 06/26/2014 2238   LABSPEC 1.009 12/22/2022 2222   LABSPEC 1.036 06/26/2014 2238   PHURINE 5.0 12/22/2022 2222   GLUCOSEU NEGATIVE 12/22/2022 2222   GLUCOSEU Negative 06/26/2014 2238   HGBUR NEGATIVE 12/22/2022 2222   BILIRUBINUR NEGATIVE 12/22/2022 2222   BILIRUBINUR 1+ 06/26/2014 2238   [place] NEGATIVE 12/22/2022 2222   PROTEINUR NEGATIVE 12/22/2022 2222   NITRITE NEGATIVE 12/22/2022 2222   LEUKOCYTESUR  NEGATIVE 12/22/2022 2222   LEUKOCYTESUR 1+ 06/26/2014 2238   This document was prepared using Dragon Voice Recognition software and may include unintentional dictation errors.  Dr. Tobie Poet [facility]  If 7PM-7AM, please contact overnight-coverage provider If 7AM-7PM, please contact day coverage provider [email or web address]  01/18/2023, 11:07 PM

## 2023-01-18 NOTE — ED Provider Notes (Signed)
[facility] Provider Note  Patient Contact: 9:31 PM (approximate)   History   Weakness   HPI  Miguel [name] is a 62 y.o. male with history of hypertension, asthma, GERD and hyperkalemia presents to the emergency department due to a generalized feeling of weakness at home.  Patient states that he has had diminished appetite and has felt increasingly anxious.  He states that he has been sober from alcohol for 14 months but is concerned about his wife who is currently in rehab facility for leg fracture.  He denies chest pain, chest tightness or shortness of breath.  He denies recent vomiting and abdominal pain.  Patient referred from [facility] with hyperkalemia.      Physical Exam   Triage Vital Signs: ED Triage Vitals  Enc Vitals Group     BP 01/18/23 1532 117/68     Pulse Rate 01/18/23 1532 85     Resp 01/18/23 1532 20     Temp 01/18/23 1532 98.4 F (36.9 C)     Temp Source 01/18/23 1532 Oral     SpO2 01/18/23 1532 98 %     Weight 01/18/23 1650 189 lb 9.5 oz (86 kg)     Height 01/18/23 1650 6' (1.829 m)     Head Circumference --      Peak Flow --      Pain Score 01/18/23 1531 8     Pain Loc --      Pain Edu? --      Excl. in [place]? --     Most recent vital signs: Vitals:   01/18/23 1533 01/18/23 2103  BP: 116/76 108/81  Pulse: 87 96  Resp: 18 16  Temp: 98.4 F (36.9 C) 98 F (36.7 C)  SpO2: 100% 99%     General: Alert and in no acute distress. Eyes:  PERRL. EOMI. Head: No acute traumatic findings ENT:      Nose: No congestion/rhinnorhea.      Mouth/Throat: Mucous membranes are moist. Neck: No stridor. No cervical spine tenderness to palpation. Cardiovascular:  Good peripheral perfusion Respiratory: Normal respiratory effort without tachypnea or retractions. Lungs CTAB. Good air entry to the bases with no decreased or absent breath sounds. Gastrointestinal: Bowel sounds 4 quadrants. Soft and nontender to palpation. No guarding or  rigidity. No palpable masses. No distention. No CVA tenderness. Musculoskeletal: Full range of motion to all extremities.  Neurologic:  No gross focal neurologic deficits are appreciated.  Skin:   No rash noted    ED Results / Procedures / Treatments   Labs (all labs ordered are listed, but only abnormal results are displayed) Labs Reviewed  CBC WITH DIFFERENTIAL/PLATELET - Abnormal; Notable for the following components:      Result Value   Hemoglobin 12.4 (*)    All other components within normal limits  COMPREHENSIVE METABOLIC PANEL - Abnormal; Notable for the following components:   Sodium 130 (*)    Potassium 6.0 (*)    CO2 15 (*)    Glucose, Bld 130 (*)    BUN 37 (*)    Creatinine, Ser 1.79 (*)    GFR, Estimated 42 (*)    All other components within normal limits  CBG MONITORING, ED - Abnormal; Notable for the following components:   Glucose-Capillary 115 (*)    All other components within normal limits  LIPASE, BLOOD  CK  BASIC METABOLIC PANEL  CBC  MAGNESIUM  TROPONIN I (HIGH SENSITIVITY)  TROPONIN I (HIGH  SENSITIVITY)     EKG  Normal sinus rhythm without ST segment elevation or other apparent arrhythmia.      PROCEDURES:  Critical Care performed: No  Procedures   MEDICATIONS ORDERED IN ED: Medications  sodium zirconium cyclosilicate (LOKELMA) packet 10 g (10 g Oral Given 01/18/23 1858)  insulin aspart (novoLOG) injection 5 Units (5 Units Intravenous Given 01/18/23 2138)    And  dextrose 50 % solution 50 mL (has no administration in time range)  acetaminophen (TYLENOL) tablet 650 mg (has no administration in time range)    Or  acetaminophen (TYLENOL) suppository 650 mg (has no administration in time range)  ondansetron (ZOFRAN) tablet 4 mg (has no administration in time range)    Or  ondansetron (ZOFRAN) injection 4 mg (has no administration in time range)  heparin injection 5,000 Units (has no administration in time range)  senna-docusate  (Senokot-S) tablet 1 tablet (has no administration in time range)  LORazepam (ATIVAN) tablet 0.5 mg (has no administration in time range)  pantoprazole (PROTONIX) EC tablet 40 mg (has no administration in time range)  polyethylene glycol powder (GLYCOLAX/MIRALAX) container 17 g (has no administration in time range)  senna-docusate (Senokot-S) tablet 1 tablet (has no administration in time range)  cyanocobalamin (VITAMIN B12) tablet 1,000 mcg (has no administration in time range)  folic acid (FOLVITE) tablet 1 mg (has no administration in time range)  thiamine (VITAMIN B1) tablet 100 mg (has no administration in time range)  fluticasone furoate-vilanterol (BREO ELLIPTA) 200-25 MCG/ACT 1 puff (has no administration in time range)  ascorbic acid (VITAMIN C) tablet 500 mg (has no administration in time range)  0.9 %  sodium chloride infusion (has no administration in time range)  sodium chloride 0.9 % bolus 1,000 mL (0 mLs Intravenous Stopped 01/18/23 1830)  LORazepam (ATIVAN) tablet 0.5 mg (0.5 mg Oral Given 01/18/23 2103)     IMPRESSION / MDM / ASSESSMENT AND PLAN / ED COURSE  I reviewed the triage vital signs and the nursing notes.                              Assessment and plan Hyperkalemia Anxiety 62 year old male presents to the emergency department with concern for hyperkalemia and was referred from the [facility] [facility].  Vital signs reassuring at triage.  On exam, patient overall well-appearing but did seem anxious.  Patient had mild hyponatremia on CMP and elevated potassium at 6.  Creatinine 1.79 which is increased from 1.3712 days ago.  Troponin within range.  No signs of peaked T waves on EKG.  No ST segment elevation or other apparent arrhythmia.  Patient was given Lokelma, normal saline bolus, D50 and NovoLog while in the emergency department.  Will admit patient to the hospitalist service for further care and management for hyperkalemia.  Patient was accepted for admission under  the care of hospitalist, Dr. Tobie Poet.   FINAL CLINICAL IMPRESSION(S) / ED DIAGNOSES   Final diagnoses:  Hyperkalemia     Rx / DC Orders   ED Discharge Orders     None        Note:  This document was prepared using Dragon voice recognition software and may include unintentional dictation errors.   Vallarie Mare [place], PA-C 01/18/23 2141    Lucillie Garfinkel, MD 01/18/23 [id number]

## 2023-01-18 NOTE — Assessment & Plan Note (Addendum)
With depression - Lorazepam 0.5 mg p.o. every 6 hours as needed for anxiety, 2 days ordered - Patient denies suicidal and homicidal ideation, he states never - Patient states that his wife is his best friend and he did does not have any friends since she has been hospitalized in rehab - He reports that his car can only go 60 miles an hour and therefore he cannot get on the highway in order to visit her at the rehab center, he states he does not have a PCP and no one to prescribe him appropriate medications - I counseled patient on going to church, volunteering at a soup kitchen, to develop community.  He nods in agreement. - He believes that nothing goes his way, he states he feels hopeless, and helpless - Behavioral health consulted

## 2023-01-19 DIAGNOSIS — E875 Hyperkalemia: Secondary | ICD-10-CM

## 2023-01-19 DIAGNOSIS — F331 Major depressive disorder, recurrent, moderate: Secondary | ICD-10-CM | POA: Diagnosis not present

## 2023-01-19 LAB — BASIC METABOLIC PANEL
Anion gap: 4 — ABNORMAL LOW (ref 5–15)
BUN: 29 mg/dL — ABNORMAL HIGH (ref 8–23)
CO2: 18 mmol/L — ABNORMAL LOW (ref 22–32)
Calcium: 8.6 mg/dL — ABNORMAL LOW (ref 8.9–10.3)
Chloride: 111 mmol/L (ref 98–111)
Creatinine, Ser: 1.29 mg/dL — ABNORMAL HIGH (ref 0.61–1.24)
GFR, Estimated: >60 mL/min (ref >60)
Glucose, Bld: 117 mg/dL — ABNORMAL HIGH (ref 70–99)
Potassium: 4.9 mmol/L (ref 3.5–5.1)
Sodium: 133 mmol/L — ABNORMAL LOW (ref 135–145)

## 2023-01-19 LAB — URINALYSIS, COMPLETE (UACMP) WITH MICROSCOPIC
Bilirubin Urine: NEGATIVE
Glucose, UA: NEGATIVE mg/dL
Hgb urine dipstick: NEGATIVE
Ketones, ur: NEGATIVE mg/dL
Leukocytes,Ua: NEGATIVE
Nitrite: NEGATIVE
Protein, ur: NEGATIVE mg/dL
Specific Gravity, Urine: 1.008 (ref 1.005–1.030)
pH: 5 (ref 5.0–8.0)

## 2023-01-19 LAB — CK: Total CK: 10 U/L — ABNORMAL LOW (ref 49–397)

## 2023-01-19 LAB — CBC
HCT: 32.6 % — ABNORMAL LOW (ref 39.0–52.0)
Hemoglobin: 10.6 g/dL — ABNORMAL LOW (ref 13.0–17.0)
MCH: 28.4 pg (ref 26.0–34.0)
MCHC: 32.5 g/dL (ref 30.0–36.0)
MCV: 87.4 fL (ref 80.0–100.0)
Platelets: 216 10*3/uL (ref 150–400)
RBC: 3.73 MIL/uL — ABNORMAL LOW (ref 4.22–5.81)
RDW: 15.1 % (ref 11.5–15.5)
WBC: 8.3 10*3/uL (ref 4.0–10.5)
nRBC: 0 % (ref 0.0–0.2)

## 2023-01-19 LAB — MAGNESIUM: Magnesium: 1.8 mg/dL (ref 1.7–2.4)

## 2023-01-19 LAB — URIC ACID: Uric Acid, Serum: 11.1 mg/dL — ABNORMAL HIGH (ref 3.7–8.6)

## 2023-01-19 LAB — POTASSIUM: Potassium: 4.5 mmol/L (ref 3.5–5.1)

## 2023-01-19 LAB — ETHANOL: Alcohol, Ethyl (B): <10 mg/dL (ref <10)

## 2023-01-19 MED ORDER — SENNOSIDES-DOCUSATE SODIUM 8.6-50 MG PO TABS: 1.0000 | ORAL_TABLET | Freq: Every evening | ORAL | Status: DC | PRN

## 2023-01-19 MED ORDER — GUAIFENESIN 100 MG/5ML PO LIQD: 5.0000 mL | ORAL | Status: DC | PRN

## 2023-01-19 MED ORDER — ORAL CARE MOUTH RINSE: 15.0000 mL | OROMUCOSAL | Status: DC | PRN

## 2023-01-19 MED ORDER — SODIUM CHLORIDE 0.9 % IV SOLN: INTRAVENOUS | Status: AC

## 2023-01-19 MED ORDER — PREDNISONE 20 MG PO TABS
40.0000 mg | ORAL_TABLET | Freq: Every day | ORAL | Status: DC
  Administered 2023-01-19 – 2023-01-20 (×2): 40 mg via ORAL
  Filled 2023-01-19 (×2): qty 2

## 2023-01-19 MED ORDER — MORPHINE SULFATE (PF) 2 MG/ML IV SOLN
1.0000 mg | INTRAVENOUS | Status: DC | PRN
  Administered 2023-01-19: 1 mg via INTRAVENOUS

## 2023-01-19 MED ORDER — TRAZODONE HCL 50 MG PO TABS: 50.0000 mg | ORAL_TABLET | Freq: Every evening | ORAL | Status: DC | PRN

## 2023-01-19 MED ORDER — OXYCODONE HCL 5 MG PO TABS
5.0000 mg | ORAL_TABLET | ORAL | Status: DC | PRN
  Administered 2023-01-19 – 2023-01-20 (×4): 5 mg via ORAL
  Filled 2023-01-19 (×4): qty 1

## 2023-01-19 MED ORDER — HYDRALAZINE HCL 20 MG/ML IJ SOLN: 10.0000 mg | INTRAMUSCULAR | Status: DC | PRN

## 2023-01-19 MED ORDER — METOPROLOL TARTRATE 5 MG/5ML IV SOLN: 5.0000 mg | INTRAVENOUS | Status: DC | PRN

## 2023-01-19 NOTE — Consult Note (Signed)
[place] [facility] Consult   Reason for Consult:  depression Referring Physician:  Cox Patient Identification: Miguel [name] MRN:  [id number] Principal Diagnosis: Moderate episode of recurrent major depressive disorder ([place]) Diagnosis:  Principal Problem:   Moderate episode of recurrent major depressive disorder (HCC) Active Problems:   Hypertension   Substance induced mood disorder (HCC)   GERD (gastroesophageal reflux disease)   OCD (obsessive compulsive disorder)   AKI (acute kidney injury) ([place])   MDD (major depressive disorder), recurrent severe, without psychosis ([place])   Social anxiety disorder   Generalized anxiety disorder   Hyperkalemia   Frequency of urination and polyuria   Total Time spent with patient: 45 minutes  Subjective:   Miguel [name] is a 62 y.o. male patient admitted with hyperkalemia.  HPI:  Patient was seen on the medical floor, where he is being treated for hyperkalemia and is consulted for depression and anxiety.  On evaluation, patient is alert and oriented x4. Speaks in clear, coherent sentences.  Talkative, not hypomanic.  Patient speaks of his stressors being his wife (their 43st anniversary is today) being in rehab facility for the past 14 months. The facility is in [place] and his vehicle is unable to make it there frequently. Patient reports that he stopped drinking alcohol 1.5  years ago after a long history of alcohol abuse. Patient reports that he has been somewhat depressed due to "not feeling good," with the hyperkalemia as well as stress about his wife being in rehab and wanting her to be able to come home soon. Patient denies thought or intent of suicide, expresses that he sometimes feels "hopeless" but reports that he is hopeful that he will feel better after getting into some psychotherapeutic groups and demonstrates future-oriented thinking, as he has an appointment at [facility] to begin mental health services on Friday, Mar. 8. From out  conversation it appears that he does not have many friends, limited social life. He has a few close family members who check on him.  He had been seeing providers at [facility], but had to discontinue due to getting Medicaid, so he will go to [facility]. Patient denies auditory or visual hallucinations and his perceptions appear to be normal.      Past Psychiatric History: MDD, alcohol use disorder. 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    OCD (obsessive compulsive disorder)    Renal colic     Past Surgical History:  Procedure Laterality Date   CHOLECYSTECTOMY  2012   COLONOSCOPY     COLONOSCOPY N/A 12/24/2022   Procedure: COLONOSCOPY;  Surgeon: [name], MD;  Location: [facility] ENDOSCOPY;  Service: Endoscopy;  Laterality: N/A;   COLONOSCOPY WITH PROPOFOL N/A 05/28/2020   Procedure: COLONOSCOPY WITH PROPOFOL;  Surgeon: [name], MD;  Location: [facility] ENDOSCOPY;  Service: Gastroenterology;  Laterality: N/A;   ESOPHAGOGASTRODUODENOSCOPY N/A 12/24/2022   Procedure: ESOPHAGOGASTRODUODENOSCOPY (EGD);  Surgeon: [name], MD;  Location: [facility] ENDOSCOPY;  Service: Endoscopy;  Laterality: N/A;   EXTRACORPOREAL SHOCK WAVE LITHOTRIPSY Left 01/12/2019   Procedure: EXTRACORPOREAL SHOCK WAVE LITHOTRIPSY (ESWL);  Surgeon: Billey Co, MD;  Location: [facility] ORS;  Service: Urology;  Laterality: Left;   UPPER GI ENDOSCOPY     Family History:  Family History  Problem Relation Age of Onset   Alcohol abuse Father    Breast cancer Mother 79   Anxiety disorder Brother    Other Maternal  Grandmother        unknown medical history   Other Maternal Grandfather        unknow medical history   Other Paternal Grandmother        unknown medical history   Other Paternal Grandfather        unknown medical history   Healthy Brother    Family Psychiatric  History:  Social History:  Social History   Substance and Sexual Activity  Alcohol Use Not Currently   Alcohol/week: 24.0 standard drinks of alcohol   Types: 24 Shots of liquor per week   Comment: last used 09/2021     Social History   Substance and Sexual Activity  Drug Use Not Currently   Types: Marijuana   Comment: last use 09/2021    Social History   Socioeconomic History   Marital status: Married    Spouse name: Not on file   Number of children: Not on file   Years of education: Not on file   Highest education level: Not on file  Occupational History   Not on file  Tobacco Use   Smoking status: Former    Types: Cigarettes    Quit date: 03/19/1981    Years since quitting: 41.8   Smokeless tobacco: Never  Vaping Use   Vaping Use: Never used  Substance and Sexual Activity   Alcohol use: Not Currently    Alcohol/week: 24.0 standard drinks of alcohol    Types: 24 Shots of liquor per week    Comment: last used 09/2021   Drug use: Not Currently    Types: Marijuana    Comment: last use 09/2021   Sexual activity: Not Currently  Other Topics Concern   Not on file  Social History Narrative   Lives at home with his wife and takes care of his wife. Independent at baseline      - Biggest strain is financial but doesn't think they could got get more help.      Patient expressed interest in possible financial assistance. Informed written consent obtained   Social Determinants of Health   Financial Resource Strain: High Risk (09/18/2020)   Overall Financial Resource Strain (CARDIA)    Difficulty of Paying Living Expenses: Very hard  Food Insecurity: Unknown (01/19/2023)   Hunger Vital Sign    Worried About  Running Out of Food in the Last Year: Patient refused    [place] in the Last Year: Patient refused  Transportation Needs: No Transportation Needs (01/19/2023)   PRAPARE - Hydrologist (Medical): No    Lack of Transportation (Non-Medical): No  Physical Activity: Sufficiently Active (09/18/2020)   Exercise Vital Sign    Days of Exercise per Week: 7 days    Minutes of Exercise per Session: 60 min  Stress: Stress Concern Present (09/18/2020)   [place]    Feeling of Stress : Very much  Social Connections: Socially Isolated (09/18/2020)   Social Connection and  Isolation Panel [NHANES]    Frequency of Communication with Friends and Family: Once a week    Frequency of Social Gatherings with Friends and Family: Once a week    Attends Religious Services: Never    Marine scientist or Organizations: No    Attends Archivist Meetings: Never    Marital Status: Married   Additional Social History:    Allergies:  No Known Allergies  Labs:  Results for orders placed or performed during the hospital encounter of 01/18/23 (from the past 48 hour(s))  CBC with Differential     Status: Abnormal   Collection Time: 01/18/23  4:49 PM  Result Value Ref Range   WBC 9.9 4.0 - 10.5 K/uL   RBC 4.30 4.22 - 5.81 MIL/uL   Hemoglobin 12.4 (L) 13.0 - 17.0 g/dL   HCT 41.1 39.0 - 52.0 %   MCV 95.6 80.0 - 100.0 fL   MCH 28.8 26.0 - 34.0 pg   MCHC 30.2 30.0 - 36.0 g/dL   RDW 15.0 11.5 - 15.5 %   Platelets 204 150 - 400 K/uL   nRBC 0.0 0.0 - 0.2 %   Neutrophils Relative % 74 %   Neutro Abs 7.3 1.7 - 7.7 K/uL   Lymphocytes Relative 14 %   Lymphs Abs 1.4 0.7 - 4.0 K/uL   Monocytes Relative 10 %   Monocytes Absolute 1.0 0.1 - 1.0 K/uL   Eosinophils Relative 1 %   Eosinophils Absolute 0.1 0.0 - 0.5 K/uL   Basophils Relative 0 %   Basophils Absolute 0.0 0.0 - 0.1 K/uL   Immature Granulocytes 1 %    Abs Immature Granulocytes 0.06 0.00 - 0.07 K/uL    Comment: Performed at [facility], [address] [place], [place] [address]  Comprehensive metabolic panel     Status: Abnormal   Collection Time: 01/18/23  4:49 PM  Result Value Ref Range   Sodium 130 (L) 135 - 145 mmol/L   Potassium 6.0 (H) 3.5 - 5.1 mmol/L    Comment: HEMOLYSIS AT THIS LEVEL MAY AFFECT RESULT   Chloride 107 98 - 111 mmol/L   CO2 15 (L) 22 - 32 mmol/L   Glucose, Bld 130 (H) 70 - 99 mg/dL    Comment: Glucose reference range applies only to samples taken after fasting for at least 8 hours.   BUN 37 (H) 8 - 23 mg/dL   Creatinine, Ser 1.79 (H) 0.61 - 1.24 mg/dL   Calcium 9.2 8.9 - 10.3 mg/dL   Total Protein 7.2 6.5 - 8.1 g/dL   Albumin 3.9 3.5 - 5.0 g/dL   AST 21 15 - 41 U/L    Comment: HEMOLYSIS AT THIS LEVEL MAY AFFECT RESULT   ALT 12 0 - 44 U/L    Comment: HEMOLYSIS AT THIS LEVEL MAY AFFECT RESULT   Alkaline Phosphatase 69 38 - 126 U/L   Total Bilirubin 1.0 0.3 - 1.2 mg/dL    Comment: HEMOLYSIS AT THIS LEVEL MAY AFFECT RESULT   GFR, Estimated 42 (L) >60 mL/min    Comment: (NOTE) Calculated using the CKD-EPI Creatinine Equation (2021)    Anion gap 8 5 - 15    Comment: Performed at [facility], [address] [place], [place] [address]  Lipase, blood     Status: None   Collection Time: 01/18/23  4:49 PM  Result Value Ref Range   Lipase 44 11 - 51 U/L    Comment: Performed at [facility], [place], [place] [address]  Troponin I (High Sensitivity)     Status: None   Collection Time: 01/18/23  4:49 PM  Result Value Ref Range   Troponin I (High Sensitivity) <2 <18 ng/L    Comment: (NOTE) Elevated high sensitivity troponin I (hsTnI) values and significant  changes across serial measurements may suggest ACS but many other  chronic and acute conditions are known to elevate hsTnI results.  Refer to the Links section for chest pain algorithms and additional   guidance. Performed at [facility], [place], [place] [address]   Troponin I (High Sensitivity)     Status: None   Collection Time: 01/18/23  6:25 PM  Result Value Ref Range   Troponin I (High Sensitivity) <2 <18 ng/L    Comment: (NOTE) Elevated high sensitivity troponin I (hsTnI) values and significant  changes across serial measurements may suggest ACS but many other  chronic and acute conditions are known to elevate hsTnI results.  Refer to the "Links" section for chest pain algorithms and additional  guidance. Performed at [facility], [address] [place], [place] [address]   CBG monitoring, ED     Status: Abnormal   Collection Time: 01/18/23  9:00 PM  Result Value Ref Range   Glucose-Capillary 115 (H) 70 - 99 mg/dL    Comment: Glucose reference range applies only to samples taken after fasting for at least 8 hours.  CBG monitoring, ED     Status: Abnormal   Collection Time: 01/18/23 10:40 PM  Result Value Ref Range   Glucose-Capillary 143 (H) 70 - 99 mg/dL    Comment: Glucose reference range applies only to samples taken after fasting for at least 8 hours.  CK     Status: Abnormal   Collection Time: 01/18/23 11:25 PM  Result Value Ref Range   Total CK 10 (L) 49 - 397 U/L    Comment: Performed at [facility], [address] [place], [place] [address]  Magnesium     Status: None   Collection Time: 01/18/23 11:25 PM  Result Value Ref Range   Magnesium 1.8 1.7 - 2.4 mg/dL    Comment: Performed at [facility], [address] [place], [place] [address]  Ethanol     Status: None   Collection Time: 01/18/23 11:25 PM  Result Value Ref Range   Alcohol, Ethyl (B) <10 <10 mg/dL    Comment: (NOTE) Lowest detectable limit for serum alcohol is 10 mg/dL.  For medical purposes only. Performed at [facility], [address] [place], [place] [address]   Potassium     Status: None   Collection Time:  01/18/23 11:25 PM  Result Value Ref Range   Potassium 4.5 3.5 - 5.1 mmol/L    Comment: Performed at [facility], [address] [place], [place] [address]  Urinalysis, Complete w Microscopic -Urine, Clean Catch     Status: Abnormal   Collection Time: 01/19/23  2:16 AM  Result Value Ref Range   Color, Urine YELLOW (A) YELLOW   APPearance CLEAR (A) CLEAR   Specific Gravity, Urine 1.008 1.005 - 1.030   pH 5.0 5.0 - 8.0   Glucose, UA NEGATIVE NEGATIVE mg/dL   Hgb urine dipstick NEGATIVE NEGATIVE   Bilirubin Urine NEGATIVE NEGATIVE   Ketones, ur NEGATIVE NEGATIVE mg/dL   Protein, ur NEGATIVE NEGATIVE mg/dL   Nitrite NEGATIVE NEGATIVE   Leukocytes,Ua NEGATIVE NEGATIVE   WBC,  UA 0-5 0 - 5 WBC/hpf   Bacteria, UA RARE (A) NONE SEEN   Squamous Epithelial / HPF 0-5 0 - 5 /HPF   Mucus PRESENT     Comment: Performed at [facility], [address] [place], [place] [id number]  Basic metabolic panel     Status: Abnormal   Collection Time: 01/19/23  4:51 AM  Result Value Ref Range   Sodium 133 (L) 135 - 145 mmol/L   Potassium 4.9 3.5 - 5.1 mmol/L   Chloride 111 98 - 111 mmol/L   CO2 18 (L) 22 - 32 mmol/L   Glucose, Bld 117 (H) 70 - 99 mg/dL    Comment: Glucose reference range applies only to samples taken after fasting for at least 8 hours.   BUN 29 (H) 8 - 23 mg/dL   Creatinine, Ser 1.29 (H) 0.61 - 1.24 mg/dL   Calcium 8.6 (L) 8.9 - 10.3 mg/dL   GFR, Estimated >60 >60 mL/min    Comment: (NOTE) Calculated using the CKD-EPI Creatinine Equation (2021)    Anion gap 4 (L) 5 - 15    Comment: Performed at [facility], [address] [place], [place] [address]  CBC     Status: Abnormal   Collection Time: 01/19/23  4:51 AM  Result Value Ref Range   WBC 8.3 4.0 - 10.5 K/uL   RBC 3.73 (L) 4.22 - 5.81 MIL/uL   Hemoglobin 10.6 (L) 13.0 - 17.0 g/dL   HCT 32.6 (L) 39.0 - 52.0 %   MCV 87.4 80.0 - 100.0 fL    Comment: REPEATED TO VERIFY   MCH 28.4 26.0 - 34.0 pg    MCHC 32.5 30.0 - 36.0 g/dL   RDW 15.1 11.5 - 15.5 %   Platelets 216 150 - 400 K/uL   nRBC 0.0 0.0 - 0.2 %    Comment: Performed at [facility], [address] [place], [place] [address]  Uric acid     Status: Abnormal   Collection Time: 01/19/23  4:51 AM  Result Value Ref Range   Uric Acid, Serum 11.1 (H) 3.7 - 8.6 mg/dL    Comment: Performed at [facility], [address] [place], [place] [address]    Current Facility-Administered Medications  Medication Dose Route Frequency Provider Last Rate Last Admin   0.9 %  sodium chloride infusion   Intravenous Continuous Damita Lack, MD 75 mL/hr at 01/19/23 1009 New Bag at 01/19/23 1009   acetaminophen (TYLENOL) tablet 650 mg  650 mg Oral Q6H PRN Cox, Amy N, DO   650 mg at 01/19/23 1256   Or   acetaminophen (TYLENOL) suppository 650 mg  650 mg Rectal Q6H PRN Cox, Amy N, DO       ascorbic acid (VITAMIN C) tablet 500 mg  500 mg Oral Daily Cox, Amy N, DO   500 mg at 01/19/23 1010   cyanocobalamin (VITAMIN B12) tablet 1,000 mcg  1,000 mcg Oral Daily Cox, Amy N, DO   1,000 mcg at 01/19/23 1010   fluticasone furoate-vilanterol (BREO ELLIPTA) 200-25 MCG/ACT 1 puff  1 puff Inhalation Daily Cox, Amy N, DO   1 puff at [id number]   folic acid (FOLVITE) tablet 1 mg  1 mg Oral Daily Cox, Amy N, DO   1 mg at 01/19/23 1010   guaiFENesin (ROBITUSSIN) 100 MG/5ML liquid 5 mL  5 mL Oral Q4H PRN Amin, Ankit Chirag, MD       heparin injection 5,000 Units  5,000 Units Subcutaneous Q8H Cox, Amy N, DO   5,000 Units at 01/19/23 1341   hydrALAZINE (APRESOLINE) injection 10 mg  10 mg Intravenous Q4H PRN Amin, Ankit Chirag, MD       hydrOXYzine (ATARAX) tablet 25 mg  25 mg Oral TID PRN Cox, Amy N, DO   25 mg at 01/18/23 2331   LORazepam (ATIVAN) tablet 0.5 mg  0.5 mg Oral Q6H PRN Cox, Amy N, DO   0.5 mg at 01/19/23 1010   metoprolol tartrate (LOPRESSOR) injection 5 mg  5 mg Intravenous Q4H PRN Amin, Ankit Chirag, MD       ondansetron  (ZOFRAN) tablet 4 mg  4 mg Oral Q6H PRN Cox, Amy N, DO       Or   ondansetron (ZOFRAN) injection 4 mg  4 mg Intravenous Q6H PRN Cox, Amy N, DO       Oral care mouth rinse  15 mL Mouth Rinse PRN Cox, Amy N, DO       oxymetazoline (AFRIN) 0.05 % nasal spray 1 spray  1 spray Each Nare BID PRN Cox, Amy N, DO   1 spray at 01/18/23 2332   pantoprazole (PROTONIX) EC tablet 40 mg  40 mg Oral Daily Cox, Amy N, DO   40 mg at 01/19/23 1010   polyethylene glycol powder (GLYCOLAX/MIRALAX) container 17 g  17 g Oral BID PRN Cox, Amy N, DO       predniSONE (DELTASONE) tablet 40 mg  40 mg Oral Q breakfast Amin, Ankit Chirag, MD       senna-docusate (Senokot-S) tablet 1 tablet  1 tablet Oral QHS PRN Amin, Ankit Chirag, MD       sodium zirconium cyclosilicate (LOKELMA) packet 10 g  10 g Oral Daily Cox, Amy N, DO   10 g at 01/19/23 1016   thiamine (VITAMIN B1) tablet 100 mg  100 mg Oral Daily Cox, Amy N, DO   100 mg at 01/19/23 1010   traZODone (DESYREL) tablet 50 mg  50 mg Oral QHS PRN Amin, Jeanella Flattery, MD        Musculoskeletal: Strength & Muscle Tone: within normal limits Gait & Station:  did not observe Patient leans: N/A  Psychiatric Specialty Exam:  Presentation  General Appearance: Casual  Eye Contact:Good  Speech:Clear and Coherent  Speech Volume:Normal  Handedness:No data recorded  Mood and Affect  Mood:Anxious; Depressed  Affect:Congruent   Thought Process  Thought Processes:Coherent  Descriptions of Associations:Intact  Orientation:Full (Time, Place and Person)  Thought Content:WDL  History of Schizophrenia/Schizoaffective disorder:No data recorded Duration of Psychotic Symptoms:No data recorded Hallucinations:Hallucinations: None  Ideas of Reference:None  Suicidal Thoughts:Suicidal Thoughts: No  Homicidal Thoughts:Homicidal Thoughts: No   Sensorium  Memory:Immediate Good  Judgment:Good  Insight:Good   Executive Functions  Concentration:Good  Attention  Span:Good  [place] of Knowledge:Good  Language:Good   Psychomotor Activity  Psychomotor Activity:Psychomotor Activity: Normal   Assets  Assets:Communication Skills; Desire for Improvement; Financial Resources/Insurance; Housing; Resilience   Sleep  Sleep:Sleep: Fair   Physical Exam: Physical Exam Vitals and nursing note reviewed.  HENT:     Head: Normocephalic.     Right Ear: There is no impacted cerumen.     Left Ear: There is no impacted cerumen.     Nose: No congestion or rhinorrhea.  Eyes:     General:        Right eye: No discharge.        Left eye: No discharge.  Cardiovascular:  Rate and Rhythm: Normal rate.  Pulmonary:     Effort: Pulmonary effort is normal.  Musculoskeletal:        General: Normal range of motion.     Cervical back: Normal range of motion.  Skin:    General: Skin is dry.  Neurological:     Mental Status: He is alert and oriented to person, place, and time.  Psychiatric:        Attention and Perception: Attention normal.        Mood and Affect: Mood is depressed.        Behavior: Behavior normal.        Cognition and Memory: Cognition normal.        Judgment: Judgment normal.    Review of Systems  Constitutional: Negative.   HENT: Negative.    Respiratory: Negative.    Cardiovascular: Negative.   Musculoskeletal: Negative.   Psychiatric/Behavioral:  Positive for depression. Negative for hallucinations, memory loss, substance abuse (hx of alcohol abuse; uses marijuana) and suicidal ideas. The patient is nervous/anxious and has insomnia.    Blood pressure 109/79, pulse 95, temperature 98.8 F (37.1 C), resp. rate 17, height 6' (1.829 m), weight 86 kg, SpO2 98 %. Body mass index is 25.71 kg/m.  Treatment Plan Summary: Plan Continue hydroxyzine 25 mg three times daily for anxiety. May consider sertraline, starting at 25 mg daily for depression and anxiety, after patient's sodium has improved. Reviewed with Dr. Reesa Chew via  secure chat.    Disposition: No evidence of imminent risk to self or others at present.   Patient does not meet criteria for psychiatric inpatient admission. Supportive therapy provided about ongoing stressors.  Sherlon Handing, NP 01/19/2023 2:04 PM

## 2023-01-19 NOTE — TOC Initial Note (Signed)
Transition of Care [facility]) - Initial/Assessment Note    Patient Details  Name: Miguel [name] MRN: [id number] Date of Birth: 10-Mar-1961  Transition of Care [facility]) CM/SW Contact:    Candie Chroman, LCSW Phone Number: 01/19/2023, 12:42 PM  Clinical Narrative:   Readmission prevention screen complete. CSW met with patient. No supports at bedside. CSW introduced role and explained that discharge planning would be discussed. Patient does not have a PCP. He was going to [facility] until he got Medicaid. Gave packet for free/low-cost healthcare in [place]. Patient drives himself to appointments. [facility] [place]. No issues obtaining medications. Patient lives home with his wife but she is in rehab. No home health or DME use prior to admission. No further concerns. CSW encouraged patient to contact CSW as needed. CSW will continue to follow patient for support and facilitate return home once stable. Patient's car is here and per MD he would be okay to drive himself home once stable.              Expected Discharge Plan: Home/Self Care Barriers to Discharge: Continued Medical Work up   Patient Goals and CMS Choice            Expected Discharge Plan and Services     Post Acute Care Choice: NA Living arrangements for the past 2 months: Apartment                                      Prior Living Arrangements/Services Living arrangements for the past 2 months: Apartment   Patient language and need for interpreter reviewed:: Yes Do you feel safe going back to the place where you live?: Yes      Need for Family Participation in Patient Care: Yes (Comment) Care giver support system in place?: Yes (comment)   Criminal Activity/Legal Involvement Pertinent to Current Situation/Hospitalization: No - Comment as needed  Activities of Daily Living Home Assistive Devices/Equipment: Eyeglasses ADL Screening (condition at time of admission) Patient's cognitive ability  adequate to safely complete daily activities?: Yes Is the patient deaf or have difficulty hearing?: No Does the patient have difficulty seeing, even when wearing glasses/contacts?: No Does the patient have difficulty concentrating, remembering, or making decisions?: No Patient able to express need for assistance with ADLs?: Yes Does the patient have difficulty dressing or bathing?: No Independently performs ADLs?: Yes (appropriate for developmental age) Does the patient have difficulty walking or climbing stairs?: No Weakness of Legs: None Weakness of Arms/Hands: None  Permission Sought/Granted                  Emotional Assessment Appearance:: Appears stated age Attitude/Demeanor/Rapport: Engaged, Gracious Affect (typically observed): Accepting, Appropriate, Calm, Pleasant Orientation: : Oriented to Self, Oriented to Place, Oriented to  Time, Oriented to Situation Alcohol / Substance Use: Not Applicable Psych Involvement: No (comment)  Admission diagnosis:  Hyperkalemia [E87.5] Patient Active Problem List   Diagnosis Date Noted   Frequency of urination and polyuria 01/18/2023   Acute GI bleeding 12/23/2022   Hyperkalemia 12/22/2022   Leukocytosis 12/22/2022   GI bleed 12/22/2022   RUQ abdominal tenderness 12/22/2022   Nasal congestion 12/11/2021   Shortness of breath 08/28/2021   Hypotension 11/28/2020   Genetic testing 0000000   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    Right ankle pain 06/26/2020   Family history of breast cancer  Hx of colonic polyps    Hospital discharge follow-up 06/05/2020   Kidney function test abnormal 06/05/2020   Abdominal pain 04/12/2020   Hypertensive urgency 04/12/2020   Alcohol withdrawal ([place]) 03/04/2020   Right-sided chest wall pain 02/28/2020   Alcohol abuse with alcohol-induced mood disorder ([place]) 12/02/2019   Moderate episode of recurrent major depressive disorder [facility])    Health care maintenance 10/26/2019    Right knee pain 10/26/2019   Generalized anxiety disorder 10/14/2019   MDD (major depressive disorder), recurrent severe, without psychosis ([place]) 07/27/2019   Social anxiety disorder 07/27/2019   Left-sided chest wall pain [id number]   Alcoholic cirrhosis of liver without ascites ([place]) 06/14/2019   Alcohol abuse 06/14/2019   AKI (acute kidney injury) ([place]) 05/27/2019   Alcohol withdrawal syndrome without complication ([place]) [id number]   Alcoholic intoxication without complication ([place])    Sepsis ([place]) 03/08/2019   Breast lump or mass 10/04/2018   Sepsis secondary to UTI ([place]) 09/14/2018   Asthma 07/07/2018   GERD (gastroesophageal reflux disease) 07/07/2018   OCD (obsessive compulsive disorder) [id number]   Self-inflicted laceration of left wrist ([place]) 03/10/2018   Leg hematoma 12/25/2017   Suicide and self-inflicted injury by cutting and piercing instrument ([place]) 03/10/2017   Severe recurrent major 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 ([place]) 03/09/2017   Substance induced mood disorder ([place]) 08/15/2016   Involuntary commitment 08/15/2016   Alcohol use disorder, severe, dependence ([place]) 02/05/2016   Hypertension 12/05/2015   Tachycardia 12/05/2015   Gout 11/13/2015   Chronic back pain 05/02/2015   PCP:  Pcp, No Pharmacy:   [place] [address] Phone: [phone] Fax: [place] ([place]), [place] - [place] ([place])  [address] Phone: [phone] Fax: [place], [place] - [address] [facility] [address] [place] [address] Phone: [phone] Fax: [place] [address] Phone: [phone] Fax: [phone]  [id number] [place] [address]: [phone]: [phone] [id number] [place] [address]: [phone]: [phone]   Social Determinants of Health ([place] History: SDOH Screenings   Food Insecurity: Unknown (01/19/2023)  Housing: Low Risk  (01/19/2023)  Transportation Needs: No Transportation Needs (01/19/2023)  Utilities: Not At Risk (01/19/2023)  Alcohol Screen: Medium Risk (03/19/2021)  Depression (PHQ2-9): Low Risk  (10/29/2022)  Recent Concern: Depression (PHQ2-9) - Medium Risk (10/14/2022)  Financial Resource Strain: High Risk (09/18/2020)  Physical Activity: Sufficiently Active (09/18/2020)  Social Connections: Socially Isolated (09/18/2020)  Stress: Stress Concern Present (09/18/2020)  Tobacco Use: Medium Risk (01/18/2023)   SDOH Interventions:     Readmission Risk Interventions    01/19/2023   12:40 PM  Readmission Risk Prevention Plan  Transportation Screening Complete  Medication Review ([place]uffdale[place]  PCP or Specialist appointment within 3-5 days of discharge Complete  SW Recovery Care/Counseling Consult Complete  PaSelmerot Applicable

## 2023-01-19 NOTE — Consult Note (Signed)
[facility] Consult Note  01/19/2023 11:13 AM Miguel [name]  MRN:  [id number]   Method of visit?: Face to Face  with NP Waldon Merl  Subjective:  62 yo male presents with concerns "hopelessness" and "I Jamesen't feel like eating."   HPI: Patient has a history of depression, anxiety, OCD, alcohol abuse. Patient states he was at [facility] clinic to get a refill for his Hydroxyzine, but presented with Hyperkalemia so was sent to the ED. Pt has a history of depression and anxiety and expressed "diminished appetite and increased anxiety."   On evaluation, Patient expressed "hopelessness, no appetite, not sleeping, and no energy." Patient expresses that he feels increasing sad and anxious ever since his wife has been in rehab for Multiple Sclerosis for the past 14 months. Patient took care of his wife since her initial diagnosis of MS in 2011. Today is his 54 year anniversary and has never been apart since his wife has been in rehab. Patient has a history of alcohol abuse but has been sober for th past 1.5 years. He states he has no desire to drink. He does state he uses marijuana some nights to help increase his appetite and sleep. He states another reason for not eating because he has missing front teeth - food options are limited, mostly drinks 8 oz boosts everyday. Patient has no SI/HI and expresses he wants to be hopeful and get better. Patient has a psych ward admission in 2020 and pt was on Citalopram - Pt states he remembers the medication working for him and stated "that medication helped a lot" - after discharge pt did not follow up with psych to get refills for the [facility]. Patient expressed interest in trying an antidepressant again. Patient states he was referred [facility] to continue mental health. Patient has a virtual intake appointment at [facility] on Friday (3/08). Patient states he would like to start an antidepressant before this appointment with [facility] to start feeling better.   Principal  Problem: Moderate episode of recurrent major depressive disorder (HCC) Diagnosis:  Principal Problem:   Moderate episode of recurrent major depressive disorder (HCC) Active Problems:   Hypertension   Substance induced mood disorder (HCC)   GERD (gastroesophageal reflux disease)   OCD (obsessive compulsive disorder)   AKI (acute kidney injury) ([place])   MDD (major depressive disorder), recurrent severe, without psychosis ([place])   Social anxiety disorder   Generalized anxiety disorder   Hyperkalemia   Frequency of urination and polyuria  Total Time spent with patient: 30 minutes  Past Psychiatric History:  MDD, GAD, OCD, Alcohol abuse  Psych Ward Admission for MDD on 07/27/2019   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    OCD (obsessive compulsive disorder)    Renal colic     Past Surgical History:  Procedure Laterality Date   CHOLECYSTECTOMY  2012   COLONOSCOPY     COLONOSCOPY N/A 12/24/2022   Procedure: COLONOSCOPY;  Surgeon: [name], MD;  Location: [facility] ENDOSCOPY;  Service: Endoscopy;  Laterality: N/A;   COLONOSCOPY WITH PROPOFOL N/A 05/28/2020   Procedure:  COLONOSCOPY WITH PROPOFOL;  Surgeon: [name], MD;  Location: [facility] ENDOSCOPY;  Service: Gastroenterology;  Laterality: N/A;   ESOPHAGOGASTRODUODENOSCOPY N/A 12/24/2022   Procedure: ESOPHAGOGASTRODUODENOSCOPY (EGD);  Surgeon: [name], MD;  Location[facility] ENDOSCOPY;  Service: Endoscopy;  Laterality: N/A;   EXTRACORPOREAL SHOCK WAVE LITHOTRIPSY Left 01/12/2019   Procedure: EXTRACORPOREAL SHOCK WAVE LITHOTRIPSY (ESWL);  Surgeon: Billey Co, MD;  Location: [facility] ORS;  Service: Urology;  Laterality: Left;   UPPER GI ENDOSCOPY      Family History:  Family History  Problem Relation Age of Onset   Alcohol abuse Father    Breast cancer Mother 81   Anxiety disorder Brother    Other Maternal Grandmother        unknown medical history   Other Maternal Grandfather        unknow medical history   Other Paternal Grandmother        unknown medical history   Other Paternal Grandfather        unknown medical history   Healthy Brother    Social History:  Social History   Substance and Sexual Activity  Alcohol Use Not Currently   Alcohol/week: 24.0 standard drinks of alcohol   Types: 24 Shots of liquor per week   Comment: last used 09/2021     Social History   Substance and Sexual Activity  Drug Use Not Currently   Types: Marijuana   Comment: last use 09/2021    Social History   Socioeconomic History   Marital status: Married    Spouse name: Not on file   Number of children: Not on file   Years of education: Not on file   Highest education level: Not on file  Occupational History   Not on file  Tobacco Use   Smoking status: Former    Types: Cigarettes    Quit date: 03/19/1981    Years since quitting: 41.8   Smokeless tobacco: Never  Vaping Use   Vaping Use: Never used  Substance and Sexual Activity   Alcohol use: Not Currently    Alcohol/week: 24.0 standard drinks of alcohol    Types: 24 Shots of liquor per week    Comment: last used 09/2021   Drug use: Not Currently    Types: Marijuana    Comment: last use 09/2021   Sexual activity: Not Currently  Other Topics Concern   Not on file  Social History Narrative   Lives at home with his wife and takes care of his wife. Independent at baseline      - Biggest strain is financial but doesn't think they could got get more help.      Patient expressed interest in possible financial assistance. Informed written consent obtained   Social Determinants of Health   Financial Resource Strain: High Risk (09/18/2020)   Overall Financial Resource Strain  (CARDIA)    Difficulty of Paying Living Expenses: Very hard  Food Insecurity: Unknown (01/19/2023)   Hunger Vital Sign    Worried About Running Out of Food in the Last Year: Patient refused    [place] in the Last Year: Patient refused  Transportation Needs: No Transportation Needs (01/19/2023)   PRAPARE - Hydrologist (Medical): No    Lack of Transportation (Non-Medical): No  Physical Activity: Sufficiently Active (09/18/2020)   Exercise Vital Sign    Days of Exercise per Week: 7 days    Minutes of Exercise per Session: 60 min  Stress: Stress  Concern Present (09/18/2020)   [employer] [place]    Feeling of Stress : Very much  Social Connections: Socially Isolated (09/18/2020)   Social Connection and Isolation Panel [NHANES]    Frequency of Communication with Friends and Family: Once a week    Frequency of Social Gatherings with Friends and Family: Once a week    Attends Religious Services: Never    Marine scientist or Organizations: No    Attends Music therapist: Never    Marital Status: Married    Sleep: Poor  Appetite:  Poor  Current Medications: Current Facility-Administered Medications  Medication Dose Route Frequency Provider Last Rate Last Admin   0.9 %  sodium chloride infusion   Intravenous Continuous Damita Lack, MD 75 mL/hr at 01/19/23 1009 New Bag at 01/19/23 1009   acetaminophen (TYLENOL) tablet 650 mg  650 mg Oral Q6H PRN Cox, Amy N, DO       Or   acetaminophen (TYLENOL) suppository 650 mg  650 mg Rectal Q6H PRN Cox, Amy N, DO       ascorbic acid (VITAMIN C) tablet 500 mg  500 mg Oral Daily Cox, Amy N, DO   500 mg at 01/19/23 1010   cyanocobalamin (VITAMIN B12) tablet 1,000 mcg  1,000 mcg Oral Daily Cox, Amy N, DO   1,000 mcg at 01/19/23 1010   fluticasone furoate-vilanterol (BREO ELLIPTA) 200-25 MCG/ACT 1 puff  1 puff Inhalation Daily Cox, Amy N, DO    1 puff at [id number]   folic acid (FOLVITE) tablet 1 mg  1 mg Oral Daily Cox, Amy N, DO   1 mg at 01/19/23 1010   guaiFENesin (ROBITUSSIN) 100 MG/5ML liquid 5 mL  5 mL Oral Q4H PRN Amin, Ankit Chirag, MD       heparin injection 5,000 Units  5,000 Units Subcutaneous Q8H Cox, Amy N, DO   5,000 Units at 01/19/23 [id number]   hydrALAZINE (APRESOLINE) injection 10 mg  10 mg Intravenous Q4H PRN Amin, Ankit Chirag, MD       hydrOXYzine (ATARAX) tablet 25 mg  25 mg Oral TID PRN Cox, Amy N, DO   25 mg at 01/18/23 2331   LORazepam (ATIVAN) tablet 0.5 mg  0.5 mg Oral Q6H PRN Cox, Amy N, DO   0.5 mg at 01/19/23 1010   metoprolol tartrate (LOPRESSOR) injection 5 mg  5 mg Intravenous Q4H PRN Amin, Ankit Chirag, MD       ondansetron (ZOFRAN) tablet 4 mg  4 mg Oral Q6H PRN Cox, Amy N, DO       Or   ondansetron (ZOFRAN) injection 4 mg  4 mg Intravenous Q6H PRN Cox, Amy N, DO       Oral care mouth rinse  15 mL Mouth Rinse PRN Cox, Amy N, DO       oxymetazoline (AFRIN) 0.05 % nasal spray 1 spray  1 spray Each Nare BID PRN Cox, Amy N, DO   1 spray at 01/18/23 2332   pantoprazole (PROTONIX) EC tablet 40 mg  40 mg Oral Daily Cox, Amy N, DO   40 mg at 01/19/23 1010   polyethylene glycol powder (GLYCOLAX/MIRALAX) container 17 g  17 g Oral BID PRN Cox, Amy N, DO       senna-docusate (Senokot-S) tablet 1 tablet  1 tablet Oral QHS PRN Amin, Ankit Chirag, MD       sodium zirconium cyclosilicate (LOKELMA) packet 10 g  10  g Oral Daily Cox, Amy N, DO   10 g at 01/19/23 1016   thiamine (VITAMIN B1) tablet 100 mg  100 mg Oral Daily Cox, Amy N, DO   100 mg at 01/19/23 1010   traZODone (DESYREL) tablet 50 mg  50 mg Oral QHS PRN Damita Lack, MD        Lab Results:  Results for orders placed or performed during the hospital encounter of 01/18/23 (from the past 48 hour(s))  CBC with Differential     Status: Abnormal   Collection Time: 01/18/23  4:49 PM  Result Value Ref Range   WBC 9.9 4.0 - 10.5 K/uL   RBC 4.30 4.22 -  5.81 MIL/uL   Hemoglobin 12.4 (L) 13.0 - 17.0 g/dL   HCT 41.1 39.0 - 52.0 %   MCV 95.6 80.0 - 100.0 fL   MCH 28.8 26.0 - 34.0 pg   MCHC 30.2 30.0 - 36.0 g/dL   RDW 15.0 11.5 - 15.5 %   Platelets 204 150 - 400 K/uL   nRBC 0.0 0.0 - 0.2 %   Neutrophils Relative % 74 %   Neutro Abs 7.3 1.7 - 7.7 K/uL   Lymphocytes Relative 14 %   Lymphs Abs 1.4 0.7 - 4.0 K/uL   Monocytes Relative 10 %   Monocytes Absolute 1.0 0.1 - 1.0 K/uL   Eosinophils Relative 1 %   Eosinophils Absolute 0.1 0.0 - 0.5 K/uL   Basophils Relative 0 %   Basophils Absolute 0.0 0.0 - 0.1 K/uL   Immature Granulocytes 1 %   Abs Immature Granulocytes 0.06 0.00 - 0.07 K/uL    Comment: Performed at [facility], [address] [place], [place] [address]  Comprehensive metabolic panel     Status: Abnormal   Collection Time: 01/18/23  4:49 PM  Result Value Ref Range   Sodium 130 (L) 135 - 145 mmol/L   Potassium 6.0 (H) 3.5 - 5.1 mmol/L    Comment: HEMOLYSIS AT THIS LEVEL MAY AFFECT RESULT   Chloride 107 98 - 111 mmol/L   CO2 15 (L) 22 - 32 mmol/L   Glucose, Bld 130 (H) 70 - 99 mg/dL    Comment: Glucose reference range applies only to samples taken after fasting for at least 8 hours.   BUN 37 (H) 8 - 23 mg/dL   Creatinine, Ser 1.79 (H) 0.61 - 1.24 mg/dL   Calcium 9.2 8.9 - 10.3 mg/dL   Total Protein 7.2 6.5 - 8.1 g/dL   Albumin 3.9 3.5 - 5.0 g/dL   AST 21 15 - 41 U/L    Comment: HEMOLYSIS AT THIS LEVEL MAY AFFECT RESULT   ALT 12 0 - 44 U/L    Comment: HEMOLYSIS AT THIS LEVEL MAY AFFECT RESULT   Alkaline Phosphatase 69 38 - 126 U/L   Total Bilirubin 1.0 0.3 - 1.2 mg/dL    Comment: HEMOLYSIS AT THIS LEVEL MAY AFFECT RESULT   GFR, Estimated 42 (L) >60 mL/min    Comment: (NOTE) Calculated using the CKD-EPI Creatinine Equation (2021)    Anion gap 8 5 - 15    Comment: Performed at [facility], [address] [place], [place] [address]  Lipase, blood     Status: None   Collection Time: 01/18/23   4:49 PM  Result Value Ref Range   Lipase 44 11 - 51 U/L    Comment: Performed at [facility], [address], [place], [place] [address]  Troponin I (High  Sensitivity)     Status: None   Collection Time: 01/18/23  4:49 PM  Result Value Ref Range   Troponin I (High Sensitivity) <2 <18 ng/L    Comment: (NOTE) Elevated high sensitivity troponin I (hsTnI) values and significant  changes across serial measurements may suggest ACS but many other  chronic and acute conditions are known to elevate hsTnI results.  Refer to the Links section for chest pain algorithms and additional  guidance. Performed at [facility], [place], [place] [address]   Troponin I (High Sensitivity)     Status: None   Collection Time: 01/18/23  6:25 PM  Result Value Ref Range   Troponin I (High Sensitivity) <2 <18 ng/L    Comment: (NOTE) Elevated high sensitivity troponin I (hsTnI) values and significant  changes across serial measurements may suggest ACS but many other  chronic and acute conditions are known to elevate hsTnI results.  Refer to the "Links" section for chest pain algorithms and additional  guidance. Performed at [facility], [address] [place], [place] [address]   CBG monitoring, ED     Status: Abnormal   Collection Time: 01/18/23  9:00 PM  Result Value Ref Range   Glucose-Capillary 115 (H) 70 - 99 mg/dL    Comment: Glucose reference range applies only to samples taken after fasting for at least 8 hours.  CBG monitoring, ED     Status: Abnormal   Collection Time: 01/18/23 10:40 PM  Result Value Ref Range   Glucose-Capillary 143 (H) 70 - 99 mg/dL    Comment: Glucose reference range applies only to samples taken after fasting for at least 8 hours.  CK     Status: Abnormal   Collection Time: 01/18/23 11:25 PM  Result Value Ref Range   Total CK 10 (L) 49 - 397 U/L    Comment: Performed at [facility], [address]  [place], [place] [address]  Magnesium     Status: None   Collection Time: 01/18/23 11:25 PM  Result Value Ref Range   Magnesium 1.8 1.7 - 2.4 mg/dL    Comment: Performed at [facility], [address] [place], [place] [address]  Ethanol     Status: None   Collection Time: 01/18/23 11:25 PM  Result Value Ref Range   Alcohol, Ethyl (B) <10 <10 mg/dL    Comment: (NOTE) Lowest detectable limit for serum alcohol is 10 mg/dL.  For medical purposes only. Performed at [facility], [address] [place], [place] [address]   Potassium     Status: None   Collection Time: 01/18/23 11:25 PM  Result Value Ref Range   Potassium 4.5 3.5 - 5.1 mmol/L    Comment: Performed at [facility], [address] [place],  [address]  Urinalysis, Complete w Microscopic -Urine, Clean Catch     Status: Abnormal   Collection Time: 01/19/23  2:16 AM  Result Value Ref Range   Color, Urine YELLOW (A) YELLOW   APPearance CLEAR (A) CLEAR   Specific Gravity, Urine 1.008 1.005 - 1.030   pH 5.0 5.0 - 8.0   Glucose, UA NEGATIVE NEGATIVE mg/dL   Hgb urine dipstick NEGATIVE NEGATIVE   Bilirubin Urine NEGATIVE NEGATIVE   Ketones, ur NEGATIVE NEGATIVE mg/dL   Protein, ur NEGATIVE NEGATIVE mg/dL   Nitrite NEGATIVE NEGATIVE   Leukocytes,Ua NEGATIVE NEGATIVE   WBC, UA 0-5 0 - 5 WBC/hpf   Bacteria, UA RARE (A) NONE  SEEN   Squamous Epithelial / HPF 0-5 0 - 5 /HPF   Mucus PRESENT     Comment: Performed at [facility], [address] [place], [place] [id number]  Basic metabolic panel     Status: Abnormal   Collection Time: 01/19/23  4:51 AM  Result Value Ref Range   Sodium 133 (L) 135 - 145 mmol/L   Potassium 4.9 3.5 - 5.1 mmol/L   Chloride 111 98 - 111 mmol/L   CO2 18 (L) 22 - 32 mmol/L   Glucose, Bld 117 (H) 70 - 99 mg/dL    Comment: Glucose reference range applies only to samples taken after fasting for at least 8 hours.   BUN 29 (H) 8 - 23 mg/dL   Creatinine, Ser  1.29 (H) 0.61 - 1.24 mg/dL   Calcium 8.6 (L) 8.9 - 10.3 mg/dL   GFR, Estimated >60 >60 mL/min    Comment: (NOTE) Calculated using the CKD-EPI Creatinine Equation (2021)    Anion gap 4 (L) 5 - 15    Comment: Performed at [facility], [address] [place], [place] [address]  CBC     Status: Abnormal   Collection Time: 01/19/23  4:51 AM  Result Value Ref Range   WBC 8.3 4.0 - 10.5 K/uL   RBC 3.73 (L) 4.22 - 5.81 MIL/uL   Hemoglobin 10.6 (L) 13.0 - 17.0 g/dL   HCT 32.6 (L) 39.0 - 52.0 %   MCV 87.4 80.0 - 100.0 fL    Comment: REPEATED TO VERIFY   MCH 28.4 26.0 - 34.0 pg   MCHC 32.5 30.0 - 36.0 g/dL   RDW 15.1 11.5 - 15.5 %   Platelets 216 150 - 400 K/uL   nRBC 0.0 0.0 - 0.2 %    Comment: Performed at [facility], [address] [place], [place] [address]    Blood Alcohol level:  Lab Results  Component Value Date   [facility] <10 01/18/2023   ETH <10 12/22/2022   Musculoskeletal: Strength & Muscle Tone: within normal limits Gait & Station: normal Patient leans: N/A  Physical Exam: Physical Exam Vitals and nursing note reviewed.  HENT:     Head: Normocephalic and atraumatic.  Neurological:     Mental Status: He is alert.  Psychiatric:        Attention and Perception: Attention and perception normal.        Mood and Affect: Mood is anxious and depressed. Affect is tearful. Affect is not inappropriate.        Behavior: Behavior normal. Behavior is cooperative.        Thought Content: Thought content normal.        Cognition and Memory: Cognition normal.        Judgment: Judgment normal.    Review of Systems  Psychiatric/Behavioral:  Positive for depression. Negative for hallucinations, memory loss and suicidal ideas. The patient is nervous/anxious and has insomnia.    Blood pressure 109/79, pulse 95, temperature 98.8 F (37.1 C), resp. rate 17, height 6' (1.829 m), weight 86 kg, SpO2 98 %. Body mass index is 25.71 kg/m.  Treatment Plan  Summary: Depression/Anxiety: Pt is currently taking hydroxyzine and states "it helps a bit." Pt tried Buspar in the past and pt says it didn't help.  Recommending Sertraline (aka Zoloft) possible start at 25 mg once Na+ levels stabilize - due to hypernatremia (Current Na+ 133 and improving). Patient has an appointment with [place] on Friday 3/8 and will need to  follow-up with [facility] to monitor. Sertraline can help with pts depression, anxiety, and OCD.  Insomnia: Encouraged pt to not use marijuana for sleep and talked about the importance of routine  Encouraged patient to follow up with [facility] and attend counseling sessions.    [employer], Student-PA 01/19/2023, 11:13 AM

## 2023-01-19 NOTE — Progress Notes (Addendum)
PROGRESS NOTE    Miguel [name]  [id number] DOB: 1961/03/25 DOA: 01/18/2023 PCP: Pcp, No   Brief Narrative:  62 year old with history of chronic right upper quadrant abdominal pain, HTN, GERD, anxiety, former alcohol abuse was sent over from [place] clinic as he was not feeling well and found to have hyperkalemia.  In the ER his lab work showed potassium of 6.0, creatinine 1.79.  Initial treatments for hyperkalemia were given including Lokelma, D50 and insulin along with IV fluids.  Overnight hyperkalemia and acute kidney injury resolved.  He did show some signs of depression and stated BuSpar which was started on a recent admission did not work therefore psychiatry was consulted.   Assessment & Plan:  Principal Problem:   Hyperkalemia Active Problems:   AKI (acute kidney injury) ([place])   Generalized anxiety disorder   Hypertension   Substance induced mood disorder (HCC)   GERD (gastroesophageal reflux disease)   OCD (obsessive compulsive disorder)   MDD (major depressive disorder), recurrent severe, without psychosis ([place])   Social anxiety disorder   Frequency of urination and polyuria     Assessment and Plan: * Hyperkalemia with acute kidney injury -Admission potassium 6.0, this is resolved with Lokelma, D50 and insulin IV.  Admission creatinine 1.79 > 1.29.  Baseline creatinine 1.2.  UA is negative. -Will allow regular diet  Generalized anxiety disorder With depression -Denies any suicidal or homicidal ideations.  On recent admission he was seen by psychiatry and was started on BuSpar which he reports that has not been working for him therefore psychiatry team reconsulted by admitting provider.  Frequency of urination and polyuria Presumed secondary to BPH UA is negative  Acute Gout Flare, Multiple joints -Elevated Uric Acid, start Prednisone '40mg'$  po daily x 5days  GERD (gastroesophageal reflux disease) - PPI  Hypertension -Currently on home meds on hold.  IV as  needed  GERD - PPI  Recent GI bleed - EGD and colonoscopy showed hiatal hernia, nonobstructive Schatzki's ring, diverticulosis and nonbleeding internal hemorrhoid.      DVT prophylaxis: Subcu heparin Code Status: Full code Family Communication:    Status is: Inpatient Remains inpatient appropriate because: Cont hospital stay for IVF, monitor K and Cr.     Subjective: Some hemorrhoidal bleeding this morning, But no other complaints    Examination:  General exam: Appears calm and comfortable  Respiratory system: Clear to auscultation. Respiratory effort normal. Cardiovascular system: S1 & S2 heard, RRR. No JVD, murmurs, rubs, gallops or clicks. No pedal edema. Gastrointestinal system: Abdomen is nondistended, soft and nontender. No organomegaly or masses felt. Normal bowel sounds heard. Central nervous system: Alert and oriented. No focal neurological deficits. Extremities: Symmetric 5 x 5 power. Skin: No rashes, lesions or ulcers Psychiatry: Judgement and insight appear normal. Mood & affect appropriate.     Objective: Vitals:   01/18/23 1702 01/18/23 2103 01/18/23 2311 01/19/23 0413  BP:  108/81 (!) 152/81 105/70  Pulse:  96 (!) 101 93  Resp:  '16 16 16  '$ Temp:  98 F (36.7 C) 98.5 F (36.9 C) 98.6 F (37 C)  TempSrc:  Oral    SpO2:  99% 100% 98%  Weight: 86 kg     Height: 6' (1.829 m)       Intake/Output Summary (Last 24 hours) at 01/19/2023 0742 Last data filed at 01/19/2023 0300 Gross per 24 hour  Intake 1248.73 ml  Output --  Net 1248.73 ml   Filed Weights   01/18/23 1650 01/18/23 1702  Weight: 86 kg 86 kg     Data Reviewed:   CBC: Recent Labs  Lab 01/18/23 1649 01/19/23 0451  WBC 9.9 8.3  NEUTROABS 7.3  --   HGB 12.4* 10.6*  HCT 41.1 32.6*  MCV 95.6 87.4  PLT 204 [id number]   Basic Metabolic Panel: Recent Labs  Lab 01/18/23 1649 01/18/23 2325 01/19/23 0451  NA 130*  --  133*  K 6.0* 4.5 4.9  CL 107  --  111  CO2 15*  --  18*  GLUCOSE  130*  --  117*  BUN 37*  --  29*  CREATININE 1.79*  --  1.29*  CALCIUM 9.2  --  8.6*  MG  --  1.8  --    GFR: Estimated Creatinine Clearance: 64.3 mL/min (A) (by C-G formula based on SCr of 1.29 mg/dL (H)). Liver Function Tests: Recent Labs  Lab 01/18/23 1649  AST 21  ALT 12  ALKPHOS 69  BILITOT 1.0  PROT 7.2  ALBUMIN 3.9   Recent Labs  Lab 01/18/23 1649  LIPASE 44   No results for input(s): "AMMONIA" in the last 168 hours. Coagulation Profile: No results for input(s): "INR", "PROTIME" in the last 168 hours. Cardiac Enzymes: Recent Labs  Lab 01/18/23 2325  CKTOTAL 10*   BNP (last 3 results) No results for input(s): "PROBNP" in the last 8760 hours. HbA1C: No results for input(s): "HGBA1C" in the last 72 hours. CBG: Recent Labs  Lab 01/18/23 2100 01/18/23 2240  GLUCAP 115* 143*   Lipid Profile: No results for input(s): "CHOL", "HDL", "LDLCALC", "TRIG", "CHOLHDL", "LDLDIRECT" in the last 72 hours. Thyroid Function Tests: No results for input(s): "TSH", "T4TOTAL", "FREET4", "T3FREE", "THYROIDAB" in the last 72 hours. Anemia Panel: No results for input(s): "VITAMINB12", "FOLATE", "FERRITIN", "TIBC", "IRON", "RETICCTPCT" in the last 72 hours. Sepsis Labs: No results for input(s): "PROCALCITON", "LATICACIDVEN" in the last 168 hours.  No results found for this or any previous visit (from the past 240 hour(s)).       Radiology Studies: No results found.      Scheduled Meds:  ascorbic acid  500 mg Oral Daily   cyanocobalamin  1,000 mcg Oral Daily   fluticasone furoate-vilanterol  1 puff Inhalation Daily   folic acid  1 mg Oral Daily   heparin  5,000 Units Subcutaneous Q8H   pantoprazole  40 mg Oral Daily   sodium zirconium cyclosilicate  10 g Oral Daily   thiamine  100 mg Oral Daily   Continuous Infusions:  sodium chloride 100 mL/hr at 01/19/23 0030     LOS: 1 day   Time spent= 35 mins    Zaryiah Barz Arsenio Loader, MD [facility]  If  7PM-7AM, please contact night-coverage  01/19/2023, 7:42 AM

## 2023-01-20 ENCOUNTER — Other Ambulatory Visit: Payer: Self-pay

## 2023-01-20 DIAGNOSIS — F331 Major depressive disorder, recurrent, moderate: Secondary | ICD-10-CM

## 2023-01-20 LAB — CBC
HCT: 32.5 % — ABNORMAL LOW (ref 39.0–52.0)
Hemoglobin: 10.6 g/dL — ABNORMAL LOW (ref 13.0–17.0)
MCH: 28.8 pg (ref 26.0–34.0)
MCHC: 32.6 g/dL (ref 30.0–36.0)
MCV: 88.3 fL (ref 80.0–100.0)
Platelets: 213 10*3/uL (ref 150–400)
RBC: 3.68 MIL/uL — ABNORMAL LOW (ref 4.22–5.81)
RDW: 14.9 % (ref 11.5–15.5)
WBC: 10.3 10*3/uL (ref 4.0–10.5)
nRBC: 0 % (ref 0.0–0.2)

## 2023-01-20 LAB — BASIC METABOLIC PANEL
Anion gap: 7 (ref 5–15)
BUN: 17 mg/dL (ref 8–23)
CO2: 19 mmol/L — ABNORMAL LOW (ref 22–32)
Calcium: 8.9 mg/dL (ref 8.9–10.3)
Chloride: 110 mmol/L (ref 98–111)
Creatinine, Ser: 1.06 mg/dL (ref 0.61–1.24)
GFR, Estimated: >60 mL/min (ref >60)
Glucose, Bld: 134 mg/dL — ABNORMAL HIGH (ref 70–99)
Potassium: 4.7 mmol/L (ref 3.5–5.1)
Sodium: 136 mmol/L (ref 135–145)

## 2023-01-20 LAB — MAGNESIUM: Magnesium: 1.5 mg/dL — ABNORMAL LOW (ref 1.7–2.4)

## 2023-01-20 MED ORDER — SERTRALINE HCL 25 MG PO TABS
25.0000 mg | ORAL_TABLET | Freq: Every day | ORAL | 0 refills | Status: DC
  Filled 2023-01-20: qty 30, 30d supply, fill #0

## 2023-01-20 MED ORDER — PREDNISONE 20 MG PO TABS
40.0000 mg | ORAL_TABLET | Freq: Every day | ORAL | 0 refills | Status: AC
  Filled 2023-01-20: qty 8, 4d supply, fill #0

## 2023-01-20 MED ORDER — SERTRALINE HCL 25 MG PO TABS
25.0000 mg | ORAL_TABLET | Freq: Every day | ORAL | Status: DC
  Administered 2023-01-20: 25 mg via ORAL
  Filled 2023-01-20: qty 1

## 2023-01-20 MED ORDER — MAGNESIUM SULFATE 2 GM/50ML IV SOLN
2.0000 g | Freq: Once | INTRAVENOUS | Status: AC
  Administered 2023-01-20: 2 g via INTRAVENOUS
  Filled 2023-01-20: qty 50

## 2023-01-20 MED ORDER — MAGNESIUM OXIDE -MG SUPPLEMENT 400 (240 MG) MG PO TABS
800.0000 mg | ORAL_TABLET | Freq: Once | ORAL | Status: AC
  Administered 2023-01-20: 800 mg via ORAL
  Filled 2023-01-20: qty 2

## 2023-01-20 NOTE — Discharge Summary (Signed)
Physician Discharge Summary  SZYMON INFANTE [id number] DOB: Aug 01, 1961 DOA: 01/18/2023  PCP: Pcp, No  Admit date: 01/18/2023 Discharge date: 01/20/2023  Admitted From: Home Disposition: Home  Recommendations for Outpatient Follow-up:  Follow up with PCP in 1-2 weeks.  List of PCPs have been given by TOC Please obtain BMP/CBC in one week your next doctors visit.  Zoloft 25 mg at bedtime started by psychiatry.  Will need outpatient follow-up He is no longer taking BuSpar. Prednisone daily for 4 more days for his gout flare   Discharge Condition: Stable CODE STATUS: Full code Diet recommendation: Regular  Brief/Interim Summary: 62 year old with history of chronic right upper quadrant abdominal pain, HTN, GERD, anxiety, former alcohol abuse was sent over from [facility] as he was not feeling well and found to have hyperkalemia.  In the ER his lab work showed potassium of 6.0, creatinine 1.79.  Initial treatments for hyperkalemia were given including Lokelma, D50 and insulin along with IV fluids.  Overnight hyperkalemia and acute kidney injury resolved.  He did show some signs of depression and stated BuSpar which was started on a recent admission did not work therefore psychiatry was consulted.  Psychiatry recommended starting patient on Zoloft 25 mg.  With IV fluids his AKI, hyperkalemia and hyponatremia improved.  He started having gout flare during the hospitalization therefore started on prednisone.  Today patient is medically stable for discharge with outpatient recommendations as stated above.  List of PCPs have been given by [facility] team.     Assessment & Plan:  Principal Problem:   Hyperkalemia Active Problems:   AKI (acute kidney injury) ([place])   Generalized anxiety disorder   Hypertension   Substance induced mood disorder (HCC)   GERD (gastroesophageal reflux disease)   OCD (obsessive compulsive disorder)   MDD (major depressive disorder), recurrent severe, without psychosis  ([place])   Social anxiety disorder   Frequency of urination and polyuria       Assessment and Plan: * Hyperkalemia with acute kidney injury - Potassium and renal function have stabilized.  Potassium today 4.7, creatinine back to baseline at 1.06.  Full advised him to repeat blood work outpatient with PCP in next 1 week.   Generalized anxiety disorder With depression -Denies any suicidal or homicidal ideations.  Patient has not been taking his BuSpar which was previously prescribed.  Seen by psychiatry and started on Zoloft 25 mg.   Frequency of urination and polyuria Presumed secondary to BPH UA is negative   Acute Gout Flare, Multiple joints -Elevated Uric Acid, started on prednisone, will give him 4 more days.  Advised to follow-up outpatient   GERD (gastroesophageal reflux disease) - PPI   Hypertension - Resume home meds   GERD - PPI   Recent GI bleed - EGD and colonoscopy showed hiatal hernia, nonobstructive Schatzki's ring, diverticulosis and nonbleeding internal hemorrhoid.         Discharge Diagnoses:  Principal Problem:   Moderate episode of recurrent major depressive disorder (HCC) Active Problems:   AKI (acute kidney injury) ([place])   Generalized anxiety disorder   Hypertension   Substance induced mood disorder (HCC)   GERD (gastroesophageal reflux disease)   OCD (obsessive compulsive disorder)   MDD (major depressive disorder), recurrent severe, without psychosis ([place])   Social anxiety disorder   Hyperkalemia   Frequency of urination and polyuria      Consultations: None  Subjective: Feels great, tells me his joint pain is improving since starting his prednisone yesterday.  Denies  any complaints.  Discharge Exam: Vitals:   01/20/23 0429 01/20/23 0805  BP: 127/78 (!) 151/84  Pulse: 84 95  Resp: 20 16  Temp: (!) 97.4 F (36.3 C) 98.1 F (36.7 C)  SpO2: 100% 100%   Vitals:   01/19/23 1520 01/19/23 2000 01/20/23 0429 01/20/23 0805  BP: (!)  141/76 132/87 127/78 (!) 151/84  Pulse: 90 92 84 95  Resp: '20 16 20 16  '$ Temp: 98.2 F (36.8 C) 98.6 F (37 C) (!) 97.4 F (36.3 C) 98.1 F (36.7 C)  TempSrc:  Oral Oral Oral  SpO2: 100% 100% 100% 100%  Weight:      Height:        General: Pt is alert, awake, not in acute distress Cardiovascular: RRR, S1/S2 +, no rubs, no gallops Respiratory: CTA bilaterally, no wheezing, no rhonchi Abdominal: Soft, NT, ND, bowel sounds + Extremities: no edema, no cyanosis  Discharge Instructions   Allergies as of 01/20/2023   No Known Allergies      Medication List     STOP taking these medications    busPIRone 5 MG tablet Commonly known as: BUSPAR       TAKE these medications    ascorbic acid 500 MG tablet Commonly known as: VITAMIN C Take by mouth.   Breo Ellipta 200-25 MCG/ACT Aepb Generic drug: fluticasone furoate-vilanterol Inhale one puff by mouth daily   cyanocobalamin 1000 MCG tablet Take 1 tablet (1,000 mcg total) by mouth daily.   folic acid 1 MG tablet Commonly known as: FOLVITE TAKE ONE TABLET BY MOUTH ONCE EVERY DAY.   hydrOXYzine 25 MG tablet Commonly known as: ATARAX Take 1 tablet (25 mg total) by mouth 3 (three) times daily as needed for anxiety. What changed: Another medication with the same name was removed. Continue taking this medication, and follow the directions you see here.   lisinopril 40 MG tablet Commonly known as: ZESTRIL TAKE ONE TABLET BY MOUTH ONCE EVERY DAY. What changed: how much to take   metoprolol tartrate 25 MG tablet Commonly known as: LOPRESSOR Take 1 tablet (25 mg total) by mouth once daily.   omeprazole 20 MG capsule Commonly known as: PRILOSEC Take 20 mg by mouth daily.   polyethylene glycol powder 17 GM/SCOOP powder Commonly known as: MiraLax Take 17 g by mouth 2 (two) times daily as needed for moderate constipation.   predniSONE 20 MG tablet Commonly known as: DELTASONE Take 2 tablets (40 mg total) by mouth daily  with breakfast for 4 days. Start taking on: January 21, 2023   ProAir HFA 108 (90 Base) MCG/ACT inhaler Generic drug: albuterol Inhale 2 puffs into the lungs once every 4 (four) hours as needed.   senna-docusate 8.6-50 MG tablet Commonly known as: Senokot-S Take 1 tablet by mouth at bedtime as needed for mild constipation.   sertraline 25 MG tablet Commonly known as: ZOLOFT Take 1 tablet (25 mg total) by mouth at bedtime.   thiamine 100 MG tablet Commonly known as: VITAMIN B1 Take 1 tablet (100 mg total) by mouth daily.        No Known Allergies  You were cared for by a hospitalist during your hospital stay. If you have any questions about your discharge medications or the care you received while you were in the hospital after you are discharged, you can call the unit and asked to speak with the hospitalist on call if the hospitalist that took care of you is not available. Once you are discharged, your  primary care physician will handle any further medical issues. Please note that no refills for any discharge medications will be authorized once you are discharged, as it is imperative that you return to your primary care physician (or establish a relationship with a primary care physician if you do not have one) for your aftercare needs so that they can reassess your need for medications and monitor your lab values.  You were cared for by a hospitalist during your hospital stay. If you have any questions about your discharge medications or the care you received while you were in the hospital after you are discharged, you can call the unit and asked to speak with the hospitalist on call if the hospitalist that took care of you is not available. Once you are discharged, your primary care physician will handle any further medical issues. Please note that NO REFILLS for any discharge medications will be authorized once you are discharged, as it is imperative that you return to your primary care  physician (or establish a relationship with a primary care physician if you do not have one) for your aftercare needs so that they can reassess your need for medications and monitor your lab values.  Please request your Prim.MD to go over all Hospital Tests and Procedure/Radiological results at the follow up, please get all Hospital records sent to your Prim MD by signing hospital release before you go home.  Get CBC, CMP, 2 view Chest X ray checked  by Primary MD during your next visit or SNF MD in 5-7 days ( we routinely change or add medications that can affect your baseline labs and fluid status, therefore we recommend that you get the mentioned basic workup next visit with your PCP, your PCP may decide not to get them or add new tests based on their clinical decision)  On your next visit with your primary care physician please Get Medicines reviewed and adjusted.  If you experience worsening of your admission symptoms, develop shortness of breath, life threatening emergency, suicidal or homicidal thoughts you must seek medical attention immediately by calling 911 or calling your MD immediately  if symptoms less severe.  You Must read complete instructions/literature along with all the possible adverse reactions/side effects for all the Medicines you take and that have been prescribed to you. Take any new Medicines after you have completely understood and accpet all the possible adverse reactions/side effects.   Do not drive, operate heavy machinery, perform activities at heights, swimming or participation in water activities or provide baby sitting services if your were admitted for syncope or siezures until you have seen by Primary MD or a Neurologist and advised to do so again.  Do not drive when taking Pain medications.   Procedures/Studies: CT ABDOMEN PELVIS W CONTRAST  Result Date: 12/23/2022 CLINICAL DATA:  Right upper quadrant pain and rectal bleeding EXAM: CT ABDOMEN AND PELVIS WITH  CONTRAST TECHNIQUE: Multidetector CT imaging of the abdomen and pelvis was performed using the standard protocol following bolus administration of intravenous contrast. RADIATION DOSE REDUCTION: This exam was performed according to the departmental dose-optimization program which includes automated exposure control, adjustment of the mA and/or kV according to patient size and/or use of iterative reconstruction technique. CONTRAST:  123m OMNIPAQUE IOHEXOL 300 MG/ML  SOLN COMPARISON:  CT 10/12/2021 FINDINGS: Lower chest: Lung bases are clear. Hepatobiliary: No focal liver abnormality is seen. Status post cholecystectomy. No biliary dilatation. Pancreas: Unremarkable. No pancreatic ductal dilatation or surrounding inflammatory changes. Spleen: Normal in  size without focal abnormality. Adrenals/Urinary Tract: Adrenal glands are normal. No hydronephrosis. Small nonobstructing bilateral kidney stones, the largest is on the right and measures up to 7 mm. The bladder is unremarkable. Stomach/Bowel: The stomach is nonenlarged. No dilated small bowel. No acute bowel wall thickening. Negative appendix. Prominent submucosal fat deposition most evident at the right colon consistent with chronic inflammatory process. Diverticular disease of left colon. Vascular/Lymphatic: Mild aortic atherosclerosis. No aneurysm. No suspicious lymph nodes Reproductive: Prostate is unremarkable. Other: Negative for pelvic effusion or free air. Small fat containing periumbilical hernia Musculoskeletal: No acute osseous abnormality IMPRESSION: 1. No CT evidence for acute intra-abdominal or pelvic abnormality. 2. Diverticular disease of the left colon without acute inflammatory process. Prominent submucosal fat deposition most evident at the right colon consistent with chronic inflammatory process. 3. Nonobstructing kidney stones. 4. Aortic atherosclerosis. Aortic Atherosclerosis (ICD10-I70.0). Electronically Signed   By: Donavan Foil M.D.   On:  12/23/2022 16:52   DG Chest Port 1 View  Result Date: 12/22/2022 CLINICAL DATA:  Dyspnea on exertion. EXAM: PORTABLE CHEST 1 VIEW COMPARISON:  October 11, 2021 FINDINGS: Cardiomediastinal silhouette is normal. Mediastinal contours appear intact. Low lung volumes with streaky peribronchial airspace opacities which may represent consolidation or atelectasis. Osseous structures are without acute abnormality. Soft tissues are grossly normal. IMPRESSION: Low lung volumes with streaky peribronchial airspace opacities which may represent consolidation or atelectasis. Electronically Signed   By: Fidela Salisbury M.D.   On: 12/22/2022 18:16     The results of significant diagnostics from this hospitalization (including imaging, microbiology, ancillary and laboratory) are listed below for reference.     Microbiology: No results found for this or any previous visit (from the past 240 hour(s)).   Labs: BNP (last 3 results) No results for input(s): "BNP" in the last 8760 hours. Basic Metabolic Panel: Recent Labs  Lab 01/18/23 1649 01/18/23 2325 01/19/23 0451 01/20/23 0536  NA 130*  --  133* 136  K 6.0* 4.5 4.9 4.7  CL 107  --  111 110  CO2 15*  --  18* 19*  GLUCOSE 130*  --  117* 134*  BUN 37*  --  29* 17  CREATININE 1.79*  --  1.29* 1.06  CALCIUM 9.2  --  8.6* 8.9  MG  --  1.8  --  1.5*   Liver Function Tests: Recent Labs  Lab 01/18/23 1649  AST 21  ALT 12  ALKPHOS 69  BILITOT 1.0  PROT 7.2  ALBUMIN 3.9   Recent Labs  Lab 01/18/23 1649  LIPASE 44   No results for input(s): "AMMONIA" in the last 168 hours. CBC: Recent Labs  Lab 01/18/23 1649 01/19/23 0451 01/20/23 0536  WBC 9.9 8.3 10.3  NEUTROABS 7.3  --   --   HGB 12.4* 10.6* 10.6*  HCT 41.1 32.6* 32.5*  MCV 95.6 87.4 88.3  PLT 204 216 213   Cardiac Enzymes: Recent Labs  Lab 01/18/23 2325  CKTOTAL 10*   BNP: Invalid input(s): "POCBNP" CBG: Recent Labs  Lab 01/18/23 2100 01/18/23 2240  GLUCAP 115*  143*   D-Dimer No results for input(s): "DDIMER" in the last 72 hours. Hgb A1c No results for input(s): "HGBA1C" in the last 72 hours. Lipid Profile No results for input(s): "CHOL", "HDL", "LDLCALC", "TRIG", "CHOLHDL", "LDLDIRECT" in the last 72 hours. Thyroid function studies No results for input(s): "TSH", "T4TOTAL", "T3FREE", "THYROIDAB" in the last 72 hours.  Invalid input(s): "FREET3" Anemia work up No results for input(s): "VITAMINB12", "FOLATE", "  FERRITIN", "TIBC", "IRON", "RETICCTPCT" in the last 72 hours. Urinalysis    Component Value Date/Time   COLORURINE YELLOW (A) 01/19/2023 0216   APPEARANCEUR CLEAR (A) 01/19/2023 0216   APPEARANCEUR Hazy 06/26/2014 2238   LABSPEC 1.008 01/19/2023 0216   LABSPEC 1.036 06/26/2014 2238   PHURINE 5.0 01/19/2023 0216   GLUCOSEU NEGATIVE 01/19/2023 0216   GLUCOSEU Negative 06/26/2014 2238   HGBUR NEGATIVE 01/19/2023 0216   BILIRUBINUR NEGATIVE 01/19/2023 0216   BILIRUBINUR 1+ 06/26/2014 2238   KETONESUR NEGATIVE 01/19/2023 0216   PROTEINUR NEGATIVE 01/19/2023 0216   NITRITE NEGATIVE 01/19/2023 0216   LEUKOCYTESUR NEGATIVE 01/19/2023 0216   LEUKOCYTESUR 1+ 06/26/2014 2238   Sepsis Labs Recent Labs  Lab 01/18/23 1649 01/19/23 0451 01/20/23 0536  WBC 9.9 8.3 10.3   Microbiology No results found for this or any previous visit (from the past 240 hour(s)).   Time coordinating discharge:  I have spent 35 minutes face to face with the patient and on the ward discussing the patients care, assessment, plan and disposition with other care givers. >50% of the time was devoted counseling the patient about the risks and benefits of treatment/Discharge disposition and coordinating care.   SIGNED:   Damita Lack, MD  [facility] 01/20/2023, 11:03 AM   If 7PM-7AM, please contact night-coverage

## 2023-01-20 NOTE — TOC Transition Note (Signed)
Transition of Care [facility]) - CM/SW Discharge Note   Patient Details  Name: Miguel [name] MRN: [id number] Date of Birth: 1961-11-02  [facility] CM/SW Contact:  Candie Chroman, LCSW Phone Number: 01/20/2023, 10:08 AM   Clinical Narrative:   Patient has orders to discharge home today. No further concerns. CSW signing off.  Final next level of care: Home/Self Care Barriers to Discharge: Barriers Resolved   Patient Goals and CMS Choice      Discharge Placement                  Patient to be transferred to facility by: Self   Patient and family notified of of transfer: 01/20/23  Discharge Plan and Services Additional resources added to the After Visit Summary for       Post Acute Care Choice: NA                               Social Determinants of Health ([place]) Interventions SDOH Screenings   Food Insecurity: Unknown (01/19/2023)  Housing: Low Risk  (01/19/2023)  Transportation Needs: No Transportation Needs (01/19/2023)  Utilities: Not At Risk (01/19/2023)  Alcohol Screen: Medium Risk (03/19/2021)  Depression (PHQ2-9): Low Risk  (10/29/2022)  Recent Concern: Depression (PHQ2-9) - Medium Risk (10/14/2022)  Financial Resource Strain: High Risk (09/18/2020)  Physical Activity: Sufficiently Active (09/18/2020)  Social Connections: Socially Isolated (09/18/2020)  Stress: Stress Concern Present (09/18/2020)  Tobacco Use: Medium Risk (01/18/2023)     Readmission Risk Interventions    01/19/2023   12:40 PM  Readmission Risk Prevention Plan  Transportation Screening Complete  Medication Review ([place]) Complete  PCP or Specialist appointment within 3-5 days of discharge Complete  SW Recovery Care/Counseling Consult Complete  [place] Not Applicable

## 2023-01-22 ENCOUNTER — Observation Stay
Admission: EM | Admit: 2023-01-22 | Discharge: 2023-01-24 | Disposition: A | Discharge status: Home / Self Care | Payer: Medicaid Other | Attending: Internal Medicine | Admitting: Internal Medicine

## 2023-01-22 ENCOUNTER — Encounter: Payer: Self-pay | Admitting: Internal Medicine

## 2023-01-22 ENCOUNTER — Other Ambulatory Visit: Payer: Self-pay

## 2023-01-22 ENCOUNTER — Emergency Department: Payer: Medicaid Other

## 2023-01-22 DIAGNOSIS — M25532 Pain in left wrist: Secondary | ICD-10-CM | POA: Diagnosis not present

## 2023-01-22 DIAGNOSIS — Z79899 Other long term (current) drug therapy: Secondary | ICD-10-CM | POA: Insufficient documentation

## 2023-01-22 DIAGNOSIS — E871 Hypo-osmolality and hyponatremia: Secondary | ICD-10-CM | POA: Insufficient documentation

## 2023-01-22 DIAGNOSIS — J45909 Unspecified asthma, uncomplicated: Secondary | ICD-10-CM | POA: Diagnosis not present

## 2023-01-22 DIAGNOSIS — Z87891 Personal history of nicotine dependence: Secondary | ICD-10-CM | POA: Insufficient documentation

## 2023-01-22 DIAGNOSIS — I10 Essential (primary) hypertension: Secondary | ICD-10-CM | POA: Diagnosis not present

## 2023-01-22 LAB — CBC WITH DIFFERENTIAL/PLATELET
Abs Immature Granulocytes: 0.09 10*3/uL — ABNORMAL HIGH (ref 0.00–0.07)
Basophils Absolute: 0 10*3/uL (ref 0.0–0.1)
Basophils Relative: 0 %
Eosinophils Absolute: 0 10*3/uL (ref 0.0–0.5)
Eosinophils Relative: 0 %
HCT: 34.9 % — ABNORMAL LOW (ref 39.0–52.0)
Hemoglobin: 11.1 g/dL — ABNORMAL LOW (ref 13.0–17.0)
Immature Granulocytes: 1 %
Lymphocytes Relative: 9 %
Lymphs Abs: 1 10*3/uL (ref 0.7–4.0)
MCH: 28.3 pg (ref 26.0–34.0)
MCHC: 31.8 g/dL (ref 30.0–36.0)
MCV: 89 fL (ref 80.0–100.0)
Monocytes Absolute: 1.9 10*3/uL — ABNORMAL HIGH (ref 0.1–1.0)
Monocytes Relative: 18 %
Neutro Abs: 7.8 10*3/uL — ABNORMAL HIGH (ref 1.7–7.7)
Neutrophils Relative %: 72 %
Platelets: 250 10*3/uL (ref 150–400)
RBC: 3.92 MIL/uL — ABNORMAL LOW (ref 4.22–5.81)
RDW: 15.2 % (ref 11.5–15.5)
WBC: 10.9 10*3/uL — ABNORMAL HIGH (ref 4.0–10.5)
nRBC: 0 % (ref 0.0–0.2)

## 2023-01-22 LAB — BASIC METABOLIC PANEL
Anion gap: 11 (ref 5–15)
BUN: 15 mg/dL (ref 8–23)
CO2: 21 mmol/L — ABNORMAL LOW (ref 22–32)
Calcium: 9 mg/dL (ref 8.9–10.3)
Chloride: 101 mmol/L (ref 98–111)
Creatinine, Ser: 1.1 mg/dL (ref 0.61–1.24)
GFR, Estimated: >60 mL/min (ref >60)
Glucose, Bld: 177 mg/dL — ABNORMAL HIGH (ref 70–99)
Potassium: 4.1 mmol/L (ref 3.5–5.1)
Sodium: 133 mmol/L — ABNORMAL LOW (ref 135–145)

## 2023-01-22 LAB — SYNOVIAL CELL COUNT + DIFF, W/ CRYSTALS
Eosinophils-Synovial: 0 %
Lymphocytes-Synovial Fld: 3 %
Monocyte-Macrophage-Synovial Fluid: 4 %
Neutrophil, Synovial: 93 %
WBC, Synovial: 46032 /mm3 — ABNORMAL HIGH (ref 0–200)

## 2023-01-22 LAB — SEDIMENTATION RATE: Sed Rate: 41 mm/hr — ABNORMAL HIGH (ref 0–20)

## 2023-01-22 LAB — LACTIC ACID, PLASMA
Lactic Acid, Venous: 0.8 mmol/L (ref 0.5–1.9)
Lactic Acid, Venous: 0.7 mmol/L (ref 0.5–1.9)

## 2023-01-22 LAB — C-REACTIVE PROTEIN: CRP: 11.1 mg/dL — ABNORMAL HIGH (ref <1.0)

## 2023-01-22 LAB — MAGNESIUM: Magnesium: 1.4 mg/dL — ABNORMAL LOW (ref 1.7–2.4)

## 2023-01-22 MED ORDER — MAGNESIUM SULFATE 2 GM/50ML IV SOLN
2.0000 g | Freq: Once | INTRAVENOUS | Status: AC
  Administered 2023-01-22: 2 g via INTRAVENOUS
  Filled 2023-01-22: qty 50

## 2023-01-22 MED ORDER — CEFAZOLIN SODIUM-DEXTROSE 2-4 GM/100ML-% IV SOLN: 2.0000 g | Freq: Three times a day (TID) | INTRAVENOUS | Status: DC

## 2023-01-22 MED ORDER — LIDOCAINE HCL (PF) 1 % IJ SOLN
30.0000 mL | Freq: Once | INTRAMUSCULAR | Status: DC
  Filled 2023-01-22: qty 30

## 2023-01-22 MED ORDER — SODIUM CHLORIDE 0.9 % IV SOLN
2.0000 g | Freq: Once | INTRAVENOUS | Status: AC
  Administered 2023-01-22: 2 g via INTRAVENOUS
  Filled 2023-01-22: qty 20

## 2023-01-22 MED ORDER — ENOXAPARIN SODIUM 40 MG/0.4ML IJ SOSY
40.0000 mg | PREFILLED_SYRINGE | INTRAMUSCULAR | Status: DC
  Administered 2023-01-22 – 2023-01-23 (×2): 40 mg via SUBCUTANEOUS
  Filled 2023-01-22 (×2): qty 0.4

## 2023-01-22 MED ORDER — LORAZEPAM 1 MG PO TABS
1.0000 mg | ORAL_TABLET | Freq: Once | ORAL | Status: AC
  Administered 2023-01-22: 1 mg via ORAL
  Filled 2023-01-22: qty 1

## 2023-01-22 MED ORDER — MORPHINE SULFATE (PF) 4 MG/ML IV SOLN
6.0000 mg | Freq: Once | INTRAVENOUS | Status: AC
  Administered 2023-01-22: 6 mg via INTRAVENOUS
  Filled 2023-01-22: qty 2

## 2023-01-22 MED ORDER — MIDAZOLAM HCL 2 MG/2ML IJ SOLN
2.0000 mg | Freq: Once | INTRAMUSCULAR | Status: AC
  Administered 2023-01-22: 2 mg via INTRAVENOUS
  Filled 2023-01-22: qty 2

## 2023-01-22 MED ORDER — CEFAZOLIN SODIUM-DEXTROSE 2-4 GM/100ML-% IV SOLN
2.0000 g | Freq: Three times a day (TID) | INTRAVENOUS | Status: DC
  Administered 2023-01-23 – 2023-01-24 (×5): 2 g via INTRAVENOUS
  Filled 2023-01-22 (×5): qty 100

## 2023-01-22 MED ORDER — MORPHINE SULFATE (PF) 2 MG/ML IV SOLN
2.0000 mg | INTRAVENOUS | Status: DC | PRN
  Administered 2023-01-22 – 2023-01-24 (×10): 2 mg via INTRAVENOUS
  Filled 2023-01-22 (×10): qty 1

## 2023-01-22 MED ORDER — COLCHICINE 0.6 MG PO TABS
1.2000 mg | ORAL_TABLET | Freq: Once | ORAL | Status: AC
  Administered 2023-01-22: 1.2 mg via ORAL
  Filled 2023-01-22 (×2): qty 2

## 2023-01-22 MED ORDER — LIDOCAINE HCL (PF) 1 % IJ SOLN
5.0000 mL | Freq: Once | INTRAMUSCULAR | Status: AC
  Administered 2023-01-22: 5 mL

## 2023-01-22 MED ORDER — COLCHICINE 0.6 MG PO TABS
0.6000 mg | ORAL_TABLET | Freq: Every day | ORAL | Status: DC
  Administered 2023-01-23 – 2023-01-24 (×2): 0.6 mg via ORAL
  Filled 2023-01-22 (×2): qty 1

## 2023-01-22 MED ORDER — KETOROLAC TROMETHAMINE 30 MG/ML IJ SOLN
15.0000 mg | Freq: Once | INTRAMUSCULAR | Status: AC
  Administered 2023-01-22: 15 mg via INTRAVENOUS
  Filled 2023-01-22: qty 1

## 2023-01-22 MED ORDER — SODIUM CHLORIDE 0.9 % IV SOLN: 2.0000 g | INTRAVENOUS | Status: DC

## 2023-01-22 MED ORDER — SODIUM CHLORIDE 0.9 % IV SOLN: INTRAVENOUS | Status: DC

## 2023-01-22 MED ORDER — PREDNISONE 20 MG PO TABS
50.0000 mg | ORAL_TABLET | Freq: Once | ORAL | Status: AC
  Administered 2023-01-22: 50 mg via ORAL
  Filled 2023-01-22: qty 1

## 2023-01-22 MED ORDER — VANCOMYCIN HCL 1750 MG/350ML IV SOLN
1750.0000 mg | Freq: Once | INTRAVENOUS | Status: AC
  Administered 2023-01-22: 1750 mg via INTRAVENOUS
  Filled 2023-01-22: qty 350

## 2023-01-22 MED ORDER — COLCHICINE 0.6 MG PO TABS
0.6000 mg | ORAL_TABLET | Freq: Once | ORAL | Status: AC
  Administered 2023-01-22: 0.6 mg via ORAL
  Filled 2023-01-22 (×2): qty 1

## 2023-01-22 MED ORDER — HYDROMORPHONE HCL 1 MG/ML IJ SOLN
1.0000 mg | Freq: Once | INTRAMUSCULAR | Status: AC
  Administered 2023-01-22: 1 mg via INTRAVENOUS
  Filled 2023-01-22: qty 1

## 2023-01-22 MED ORDER — ONDANSETRON HCL 4 MG/2ML IJ SOLN
4.0000 mg | Freq: Once | INTRAMUSCULAR | Status: AC
  Administered 2023-01-22: 4 mg via INTRAVENOUS
  Filled 2023-01-22: qty 2

## 2023-01-22 MED ORDER — LACTATED RINGERS IV BOLUS
1000.0000 mL | Freq: Once | INTRAVENOUS | Status: AC
  Administered 2023-01-22: 1000 mL via INTRAVENOUS

## 2023-01-22 NOTE — H&P (Signed)
History and Physical    Patient: Miguel [name] [id number] DOB: 1961-07-16 DOA: 01/22/2023 DOS: the patient was seen and examined on 01/22/2023 PCP: Pcp, No  Patient coming from: Home  Chief Complaint:  Chief Complaint  Patient presents with   Medication Reaction   HPI: Miguel [name] is a 62 y.o. male who presents to the hospital with L wrist pain.  Pt denies any falls or recent trauma to the wrist.  He reports the pain is on the top of the wrist and is a throbbing type pain. It is aggravated with touching the affected area and allevaited with the pain medicine he received in the ER. There is no radiation. Pain is rated 9/10.  Pt had an arthrocentesis completed in the ER. As a result, of his intracable wrist pain he will be placed on observation for further management.    Review of Systems: As mentioned in the history of present illness. All other systems reviewed and are negative. 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    OCD (obsessive compulsive disorder)    Renal colic    Past Surgical History:  Procedure Laterality Date   CHOLECYSTECTOMY  2012   COLONOSCOPY     COLONOSCOPY N/A 12/24/2022   Procedure: COLONOSCOPY;  Surgeon: [name], MD;  Location: [facility] ENDOSCOPY;  Service: Endoscopy;  Laterality: N/A;   COLONOSCOPY WITH PROPOFOL N/A 05/28/2020   Procedure: COLONOSCOPY WITH PROPOFOL;  Surgeon: [name], MD;  Location: [facility] ENDOSCOPY;  Service: Gastroenterology;  Laterality: N/A;   ESOPHAGOGASTRODUODENOSCOPY N/A 12/24/2022   Procedure: ESOPHAGOGASTRODUODENOSCOPY (EGD);  Surgeon: [name], MD;  Location: [facility] ENDOSCOPY;  Service: Endoscopy;  Laterality: N/A;   EXTRACORPOREAL SHOCK WAVE LITHOTRIPSY Left 01/12/2019    Procedure: EXTRACORPOREAL SHOCK WAVE LITHOTRIPSY (ESWL);  Surgeon: Billey Co, MD;  Location: [facility] ORS;  Service: Urology;  Laterality: Left;   UPPER GI ENDOSCOPY     Social History:  reports that he quit smoking about 41 years ago. His smoking use included cigarettes. He has never used smokeless tobacco. He reports that he does not currently use alcohol after a past usage of about 24.0 standard drinks of alcohol per week. He reports that he does not currently use drugs after having used the following drugs: Marijuana.  No Known Allergies  Family History  Problem Relation Age of Onset   Alcohol abuse Father    Breast cancer Mother 20   Anxiety disorder Brother    Other Maternal Grandmother        unknown medical history   Other Maternal Grandfather        unknow medical history   Other Paternal Grandmother        unknown medical history   Other Paternal Grandfather        unknown medical history   Healthy Brother     Prior to Admission medications   Medication Sig Start Date End Date Taking? Authorizing Provider  albuterol (PROAIR HFA) 108 (90 Base) MCG/ACT inhaler Inhale 2 puffs into the lungs once every 4 (four) hours as needed. 03/04/22  Yes Iloabachie, Chioma E, NP  ascorbic acid (VITAMIN C)  500 MG tablet Take by mouth. 08/01/19  Yes [provider]  fluticasone furoate-vilanterol (BREO ELLIPTA) 200-25 MCG/ACT AEPB Inhale one puff by mouth daily 04/30/22  Yes Iloabachie, Chioma E, NP  hydrOXYzine (ATARAX) 25 MG tablet Take 1 tablet (25 mg total) by mouth 3 (three) times daily as needed for anxiety. 01/18/23  Yes   lisinopril (ZESTRIL) 40 MG tablet TAKE ONE TABLET BY MOUTH ONCE EVERY DAY. Patient taking differently: Take 40 mg by mouth daily. 10/27/22  Yes Iloabachie, Chioma E, NP  metoprolol tartrate (LOPRESSOR) 25 MG tablet Take 1 tablet (25 mg total) by mouth once daily. 10/27/22  Yes Iloabachie, Chioma E, NP  omeprazole (PRILOSEC) 20 MG capsule Take 20 mg by mouth  daily.   Yes [provider]  polyethylene glycol powder (MIRALAX) 17 GM/SCOOP powder Take 17 g by mouth 2 (two) times daily as needed for moderate constipation. 12/25/22  Yes Mercy Riding, MD  predniSONE (DELTASONE) 20 MG tablet Take 2 tablets (40 mg total) by mouth daily with breakfast for 4 days. 01/21/23 01/25/23 Yes Amin, Ankit Chirag, MD  senna-docusate (SENOKOT-S) 8.6-50 MG tablet Take 1 tablet by mouth at bedtime as needed for mild constipation. 12/25/22  Yes Mercy Riding, MD  sertraline (ZOLOFT) 25 MG tablet Take 1 tablet (25 mg total) by mouth at bedtime. 01/20/23  Yes Amin, Jeanella Flattery, MD  thiamine (VITAMIN B1) 100 MG tablet Take 1 tablet (100 mg total) by mouth daily. 08/04/22 08/04/23 Yes Iloabachie, Chioma E, NP  cyanocobalamin 1000 MCG tablet Take 1 tablet (1,000 mcg total) by mouth daily. Patient not taking: Reported on 01/19/2023 12/25/22 03/25/23  Mercy Riding, MD  folic acid (FOLVITE) 1 MG tablet TAKE ONE TABLET BY MOUTH ONCE EVERY DAY. Patient not taking: Reported on 12/22/2022 08/04/22   Langston Reusing, NP    Physical Exam: Vitals:   01/22/23 0515 01/22/23 0516 01/22/23 0800  BP: (!) 174/96  (!) 148/83  Pulse: (!) 109  98  Resp: (!) 24    Temp: 97.8 F (36.6 C)    TempSrc: Oral    SpO2: 100%  90%  Weight:  83.9 kg   Height:  6' (1.829 m)    Physical Exam Constitutional:      Appearance: Normal appearance.  HENT:     Head: Normocephalic.     Mouth/Throat:     Mouth: Mucous membranes are moist.  Cardiovascular:     Rate and Rhythm: Tachycardia present.  Pulmonary:     Effort: Pulmonary effort is normal.     Breath sounds: Normal breath sounds.  Abdominal:     General: Abdomen is flat.     Palpations: Abdomen is soft.  Musculoskeletal:     Cervical back: Neck supple.     Comments: Decreased ROM of the L wrist   Skin:    General: Skin is warm.  Neurological:     Mental Status: He is alert. Mental status is at baseline.  Psychiatric:        Mood and  Affect: Mood normal.     Data Reviewed:  There are no new results to review at this time.  Assessment and Plan: L wrist gout flare  - IV NS 75 cc/hr  - IV cefazolin 2 g q8hr  - Colchicine 1.2 mg, then 0.6 mg 1 hr later 01/22/2023 - Colchicine 0.6 mg PO daily starting 01/23/2023  Hypomagnesemia  - IV mag 2 g in the ER and 2 g again later this afternoon  Acute hypoNa+ - IV fluids as above   DVT prophylaxis: Lovenox 40 mg sq daily    Advance Care Planning:   Code Status: Prior FULL  Consults: Orthopedic   Severity of Illness: The appropriate patient status for this patient is OBSERVATION. Observation status is judged to be reasonable and necessary in order to provide the required intensity of service to ensure the patient's safety. The patient's presenting symptoms, physical exam findings, and initial radiographic and laboratory data in the context of their medical condition is felt to place them at decreased risk for further clinical deterioration. Furthermore, it is anticipated that the patient will be medically stable for discharge from the hospital within 2 midnights of admission.   Author: [name] , MD 01/22/2023 11:15 AM  For on call review [email or web address].

## 2023-01-22 NOTE — Plan of Care (Signed)

## 2023-01-22 NOTE — Consult Note (Signed)
PHARMACY -  BRIEF ANTIBIOTIC NOTE   Pharmacy has received consult(s) for vancomycin dosing from an ED provider.  The patient's profile has been reviewed for ht/wt/allergies/indication/available labs.    One time order(s) placed for Vancomycin 1750 mg IV x 1  Further antibiotics/pharmacy consults should be ordered by admitting physician if indicated.                       Thank you, Lorin Picket, PharmD 01/22/2023  10:51 AM

## 2023-01-22 NOTE — ED Provider Notes (Signed)
[facility] Provider Note    Event Date/Time   First MD Initiated Contact with Patient 01/22/23 [phone]     (approximate)   History   Medication Reaction   HPI  Miguel [name] is a 62 y.o. male who presents to the ED for evaluation of Medication Reaction   I reviewed medical DC summary from 3/6, 2 days ago.  Admitted for hyperkalemia, AKI and hyponatremia.  Had a gout flare starting during this hospitalization and started on prednisone.   Since being discharged 2 days ago, patient reports continued left wrist pain from his gout and poor sleep.  No falls or injuries to that left wrist, but reports severe pain.  Reports compliance with his prednisone reports feeling jittery and difficulty sleeping alongside this.    Physical Exam   Triage Vital Signs: ED Triage Vitals  Enc Vitals Group     BP 01/22/23 0515 (!) 174/96     Pulse Rate 01/22/23 0515 (!) 109     Resp 01/22/23 0515 (!) 24     Temp 01/22/23 0515 97.8 F (36.6 C)     Temp Source 01/22/23 0515 Oral     SpO2 01/22/23 0515 100 %     Weight 01/22/23 0516 185 lb (83.9 kg)     Height 01/22/23 0516 6' (1.829 m)     Head Circumference --      Peak Flow --      Pain Score 01/22/23 0517 10     Pain Loc --      Pain Edu? --      Excl. in [place]? --     Most recent vital signs: Vitals:   01/22/23 0515  BP: (!) 174/96  Pulse: (!) 109  Resp: (!) 24  Temp: 97.8 F (36.6 C)  SpO2: 100%    General: Awake, no distress.  CV:  Good peripheral perfusion.  Resp:  Normal effort.  Abd:  No distention.  MSK:  Mild swelling of the left wrist and quite tender to palpation. Neuro:  No focal deficits appreciated. Other:     ED Results / Procedures / Treatments   Labs (all labs ordered are listed, but only abnormal results are displayed) Labs Reviewed  MAGNESIUM - Abnormal; Notable for the following components:      Result Value   Magnesium 1.4 (*)    All other components within normal limits  CBC  WITH DIFFERENTIAL/PLATELET - Abnormal; Notable for the following components:   WBC 10.9 (*)    RBC 3.92 (*)    Hemoglobin 11.1 (*)    HCT 34.9 (*)    Neutro Abs 7.8 (*)    Monocytes Absolute 1.9 (*)    Abs Immature Granulocytes 0.09 (*)    All other components within normal limits  BASIC METABOLIC PANEL - Abnormal; Notable for the following components:   Sodium 133 (*)    CO2 21 (*)    Glucose, Bld 177 (*)    All other components within normal limits    EKG   RADIOLOGY  Official radiology report(s): No results found.  PROCEDURES and INTERVENTIONS:  Procedures  Medications  midazolam (VERSED) injection 2 mg (has no administration in time range)  lactated ringers bolus 1,000 mL (0 mLs Intravenous Stopped 01/22/23 0659)  morphine (PF) 4 MG/ML injection 6 mg (6 mg Intravenous Given 01/22/23 0537)  ondansetron (ZOFRAN) injection 4 mg (4 mg Intravenous Given 01/22/23 0537)  magnesium sulfate IVPB 2 g 50 mL (0 g Intravenous  Stopped 01/22/23 0658)  HYDROmorphone (DILAUDID) injection 1 mg (1 mg Intravenous Given 01/22/23 0611)  ketorolac (TORADOL) 30 MG/ML injection 15 mg (15 mg Intravenous Given 01/22/23 0658)     IMPRESSION / MDM / ASSESSMENT AND PLAN / ED COURSE  I reviewed the triage vital signs and the nursing notes.  Differential diagnosis includes, but is not limited to, gout, septic joint, malingering, strain or spasm.  Fracture  {Patient presents with symptoms of an acute illness or injury that is potentially life-threatening.  62 year old presents with acute on chronic left wrist pain, presumably a gout flare.  He is started on prednisone recently during an admission for this but reports severe and worsening pain despite this.  Blood work shows slight worsening of his magnesium that we replete IV.  Potassium is okay.  He requires multiple doses of analgesia.  He is pending signout to oncoming provider to follow-up on an x-ray of this joint and his pain control.  Clinical Course  as of 01/22/23 0715  Fri Jan 22, 2023  0605 Reassessed.  Morphine is not cutting it.  Still very uncomfortable [DS]  0642 Reassessed.  Still quite uncomfortable.  Blood work returning with good renal function.  Will try small dose of NSAIDs [DS]  0704 Recent admission for hypoK and AKI - gout flare and recently started on prednisone. L wrist pain - given IV pain medication. Toradol given.  [SM]    Clinical Course User Index [DS] Vladimir Crofts, MD [SM] Nathaniel Man, MD     FINAL CLINICAL IMPRESSION(S) / ED DIAGNOSES   Final diagnoses:  Left wrist pain  Hypomagnesemia     Rx / DC Orders   ED Discharge Orders     None        Note:  This document was prepared using Dragon voice recognition software and may include unintentional dictation errors.   Vladimir Crofts, MD 01/22/23 [phone]

## 2023-01-22 NOTE — ED Triage Notes (Signed)
Patient BIB EMS for multiple complaints including L wrist pain, anxiety, insomnia, and gout. Patient was seen in this ER on the 6th for gout and DC with prednisone. Patient states inability to sleep, increased anxiety, and severe pain in his left wrist.

## 2023-01-22 NOTE — ED Provider Notes (Signed)
Care assumed of patient from outgoing provider.  See their note for initial history, exam and plan.  Clinical Course as of 01/22/23 1043  Fri Jan 22, 2023  0605 Reassessed.  Morphine is not cutting it.  Still very uncomfortable [DS]  0642 Reassessed.  Still quite uncomfortable.  Blood work returning with good renal function.  Will try small dose of NSAIDs [DS]  0704 Recent admission for hypoK and AKI - gout flare and recently started on prednisone. L wrist pain - given IV pain medication. Toradol given.  [SM]    Clinical Course User Index [DS] Vladimir Crofts, MD [SM] Nathaniel Man, MD  Significant anxiety in the emergency department.  Given a small dose of anxiety medication.  Will obtain an x-ray of the left wrist and reevaluate.   On reevaluation continues to have significant pain to the left wrist.  X-ray with no acute fracture or dislocation.  Questionable subluxation findings on x-ray.  Patient without any history of trauma.  Consulted and discussed patient's case with orthopedic surgery Dr. Mack Guise recommended arthrocentesis and starting on antibiotics with admission for the hospitalist for pain control antibiotics and they will evaluate when he is in the hospital.  Arthrocentesis performed in the emergency department.  Will send for cell count and culture.  Started on vancomycin and ceftriaxone.  Given multiple doses of pain medication.  Given another dose of steroid while in the emergency department since he did not take his steroid this morning.  .Joint Aspiration/Arthrocentesis  Date/Time: 01/22/2023 10:43 AM  Performed by: Nathaniel Man, MD Authorized by: Nathaniel Man, MD   Consent:    Consent obtained:  Verbal and written   Consent given by:  Patient   Risks, benefits, and alternatives were discussed: yes     Risks discussed:  Bleeding, infection, pain, nerve damage, incomplete drainage and poor cosmetic result   Alternatives discussed:  Delayed treatment Universal  protocol:    Procedure explained and questions answered to patient or proxy's satisfaction: yes     Relevant documents present and verified: yes     Test results available: yes     Imaging studies available: yes     Required blood products, implants, devices, and special equipment available: yes     Site/side marked: yes     Patient identity confirmed:  Verbally with patient, arm band and hospital-assigned identification number Location:    Location:  Wrist   Wrist:  L intercarpal Anesthesia:    Anesthesia method:  Local infiltration   Local anesthetic:  Lidocaine 1% w/o epi Procedure details:    Preparation: Patient was prepped and draped in usual sterile fashion     Needle gauge:  50 G   Ultrasound guidance: no     Aspirate amount:  1   Aspirate characteristics:  Blood-tinged and serous   Steroid injected: no   Post-procedure details:    Dressing:  Adhesive bandage   Procedure completion:  Tolerated well, no immediate complications     Nathaniel Man, MD 01/22/23 1043

## 2023-01-22 NOTE — Consult Note (Signed)
ORTHOPAEDIC CONSULTATION  REQUESTING PHYSICIAN: [name], MD  Chief Complaint: Acute onset of left wrist pain  HPI: Miguel [name] is a 62 y.o. male who complains of intense left wrist pain overnight.  Patient was recently hospitalized and treated for gout in addition to hyperkalemia and hyponatremia. He was discharged on 01/20/2023 on prednisone.  Patient states he has a history of gout but is not currently on allopurinol.  He is not taking colchicine.  The patient states his previous gout flares were when he was drinking EtOH which he has not done for 14 months, according to the patient.  X-rays were taken of the left wrist in the ER.  Orthopedics was consulted for left wrist pain.  I would recommend to the ED staff that they aspirate the left wrist.  The aspirate is showing cell count of 46,000 and extracellular monosodium urate crystals consistent with gout.  Patient denies any recent fall or injury.  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    OCD (obsessive compulsive disorder)    Renal colic    Past Surgical History:  Procedure Laterality Date   CHOLECYSTECTOMY  2012   COLONOSCOPY     COLONOSCOPY N/A 12/24/2022   Procedure: COLONOSCOPY;  Surgeon: [name], MD;  Location: [facility] ENDOSCOPY;  Service: Endoscopy;  Laterality: N/A;   COLONOSCOPY WITH PROPOFOL N/A 05/28/2020   Procedure: COLONOSCOPY WITH PROPOFOL;  Surgeon: [name], MD;  Location[facility] ENDOSCOPY;  Service: Gastroenterology;  Laterality: N/A;   ESOPHAGOGASTRODUODENOSCOPY N/A 12/24/2022   Procedure: ESOPHAGOGASTRODUODENOSCOPY (EGD);  Surgeon: [name], MD;  Location: [facility] ENDOSCOPY;  Service: Endoscopy;  Laterality: N/A;   EXTRACORPOREAL SHOCK WAVE  LITHOTRIPSY Left 01/12/2019   Procedure: EXTRACORPOREAL SHOCK WAVE LITHOTRIPSY (ESWL);  Surgeon: Billey Co, MD;  Location: [facility] ORS;  Service: Urology;  Laterality: Left;   UPPER GI ENDOSCOPY     Social History   Socioeconomic History   Marital status: Married    Spouse name: Not on file   Number of children: Not on file   Years of education: Not on file   Highest education level: Not on file  Occupational History   Not on file  Tobacco Use   Smoking status: Former    Types: Cigarettes    Quit date: 03/19/1981    Years since quitting: 41.8   Smokeless tobacco: Never  Vaping Use   Vaping Use: Never used  Substance and Sexual Activity   Alcohol use: Not Currently    Alcohol/week: 24.0 standard drinks of alcohol    Types: 24 Shots of liquor per week    Comment: last used 09/2021   Drug use: Not Currently    Types: Marijuana    Comment: last use 09/2021   Sexual activity: Not Currently  Other Topics Concern   Not on file  Social History Narrative   Lives at home with his wife and takes care of his wife. Independent at baseline      - Biggest strain is financial but doesn't think they could got get more help.      Patient expressed interest in possible financial assistance. Informed written  consent obtained   Social Determinants of Health   Financial Resource Strain: High Risk (09/18/2020)   Overall Financial Resource Strain (CARDIA)    Difficulty of Paying Living Expenses: Very hard  Food Insecurity: Unknown (01/19/2023)   Hunger Vital Sign    Worried About Running Out of Food in the Last Year: Patient refused    [place] in the Last Year: Patient refused  Transportation Needs: No Transportation Needs (01/19/2023)   PRAPARE - Hydrologist (Medical): No    Lack of Transportation (Non-Medical): No  Physical Activity: Sufficiently Active (09/18/2020)   Exercise Vital Sign    Days of Exercise per Week: 7 days    Minutes of Exercise per  Session: 60 min  Stress: Stress Concern Present (09/18/2020)   [place]    Feeling of Stress : Very much  Social Connections: Socially Isolated (09/18/2020)   Social Connection and Isolation Panel [NHANES]    Frequency of Communication with Friends and Family: Once a week    Frequency of Social Gatherings with Friends and Family: Once a week    Attends Religious Services: Never    Marine scientist or Organizations: No    Attends Music therapist: Never    Marital Status: Married   Family History  Problem Relation Age of Onset   Alcohol abuse Father    Breast cancer Mother 78   Anxiety disorder Brother    Other Maternal Grandmother        unknown medical history   Other Maternal Grandfather        unknow medical history   Other Paternal Grandmother        unknown medical history   Other Paternal Grandfather        unknown medical history   Healthy Brother    No Known Allergies Prior to Admission medications   Medication Sig Start Date End Date Taking? Authorizing Provider  albuterol (PROAIR HFA) 108 (90 Base) MCG/ACT inhaler Inhale 2 puffs into the lungs once every 4 (four) hours as needed. 03/04/22  Yes Iloabachie, Chioma E, NP  ascorbic acid (VITAMIN C) 500 MG tablet Take by mouth. 08/01/19  Yes [provider]  fluticasone furoate-vilanterol (BREO ELLIPTA) 200-25 MCG/ACT AEPB Inhale one puff by mouth daily 04/30/22  Yes Iloabachie, Chioma E, NP  hydrOXYzine (ATARAX) 25 MG tablet Take 1 tablet (25 mg total) by mouth 3 (three) times daily as needed for anxiety. 01/18/23  Yes   lisinopril (ZESTRIL) 40 MG tablet TAKE ONE TABLET BY MOUTH ONCE EVERY DAY. Patient taking differently: Take 40 mg by mouth daily. 10/27/22  Yes Iloabachie, Chioma E, NP  metoprolol tartrate (LOPRESSOR) 25 MG tablet Take 1 tablet (25 mg total) by mouth once daily. 10/27/22  Yes Iloabachie, Chioma E, NP  omeprazole  (PRILOSEC) 20 MG capsule Take 20 mg by mouth daily.   Yes [provider]  polyethylene glycol powder (MIRALAX) 17 GM/SCOOP powder Take 17 g by mouth 2 (two) times daily as needed for moderate constipation. 12/25/22  Yes Mercy Riding, MD  predniSONE (DELTASONE) 20 MG tablet Take 2 tablets (40 mg total) by mouth daily with breakfast for 4 days. 01/21/23 01/25/23 Yes Amin, Ankit Chirag, MD  senna-docusate (SENOKOT-S) 8.6-50 MG tablet Take 1 tablet by mouth at bedtime as needed for mild constipation. 12/25/22  Yes Mercy Riding, MD  sertraline (ZOLOFT) 25 MG tablet Take 1 tablet (25  mg total) by mouth at bedtime. 01/20/23  Yes Amin, Jeanella Flattery, MD  thiamine (VITAMIN B1) 100 MG tablet Take 1 tablet (100 mg total) by mouth daily. 08/04/22 08/04/23 Yes Iloabachie, Chioma E, NP  cyanocobalamin 1000 MCG tablet Take 1 tablet (1,000 mcg total) by mouth daily. Patient not taking: Reported on 01/19/2023 12/25/22 03/25/23  Mercy Riding, MD  folic acid (FOLVITE) 1 MG tablet TAKE ONE TABLET BY MOUTH ONCE EVERY DAY. Patient not taking: Reported on 12/22/2022 08/04/22   Langston Reusing, NP   DG Wrist Complete Left  Result Date: 01/22/2023 CLINICAL DATA:  Gout flare. EXAM: LEFT WRIST - COMPLETE 3+ VIEW COMPARISON:  Similar study 09/05/2019 FINDINGS: There is moderate diffuse soft tissue swelling somewhat eccentric towards the dorsal and ulnar aspect. There is interval healing of the previously noted fifth metacarpal radial base fracture. There is normal bone mineralization with no evidence of an acute fracture or erosive arthropathy. There is again mild narrowing and spurring of the first [facility] joint. The most significant new finding is that the pisiform bone is more medial in position than normal and certainly more so than previously. A majority of the bone now lies medial to the plane of the ulna. Whether this is a traumatic subluxation or due to primary disease of the flexor carpi ulnaris tendon, is unclear by plain  radiography. Consultation with an orthopedic surgeon is recommended. Interosseous alignment is otherwise normal. In all other respects no further changes are seen. IMPRESSION: 1. Moderate diffuse soft tissue swelling. 2. The pisiform bone now more medial in position than normal. Whether this is a traumatic subluxation or due to primary disease of the flexor carpi ulnaris tendon is unclear by plain radiography. Consultation with an orthopedic surgeon is recommended. 3. Mild degenerative changes of the first [facility] joint. Electronically Signed   By: Telford Nab M.D.   On: 01/22/2023 07:50    Positive ROS: All other systems have been reviewed and were otherwise negative with the exception of those mentioned in the HPI and as above.  Physical Exam: General: Alert, no acute distress  MUSCULOSKELETAL: Left wrist: Patient has minimal swelling and no erythema or ecchymosis.  Patient can gently flex and extend his wrist approximate 45 degrees in both directions with mild pain.  He can pronate to 90 degrees but supinate approximately 45 degrees.  He can flex and extend his fingers of the left hand without significant pain.  He has intact sensation light touch in all 5 digits of the left hand.  He has a palpable radial pulse.  He has mild to moderate tenderness to palpation diffusely over the right wrist.  He did not have significant tenderness over the pisiform.  Assessment: Persistent left wrist gout  Plan: The patient has had improvement in his left wrist pain since his aspiration.  The aspirate demonstrates findings consistent with gout.  Gram stain and culture have been ordered on the aspirate but no results are available yet at this time.  Patient received ceftriaxone and Vancomycin initially in the ER after aspiration and has been switched to Ancef.  He is been admitted to the hospitalist service.  He has been started on colchicine.  Patient's wrist does not appear classic for septic joint.  I will continue  to follow the Gram stain and culture results along with the hospitalist service.  There is no plan at this time for any surgical intervention.  The x-ray films of the left wrist demonstrated possible subluxation of the  left pisiform, but there has been no recent history of trauma.  The radiologist noted this finding in comparison to a previous left wrist film in 2020.  If there has been subluxation or dislocation of the pisiform it is most likely chronic as the patient does not describe any recent injury.  There is no intervention necessary for this at that time.  Patient can follow-up electively with our hand specialist if he wishes to be further evaluated for pisiform subluxation.  Currently he is having no tenderness or pain specifically over the pisiform bone I would not recommend any intervention at this time.   Thornton Park, MD    01/22/2023 1:27 PM

## 2023-01-23 DIAGNOSIS — M25532 Pain in left wrist: Secondary | ICD-10-CM | POA: Diagnosis not present

## 2023-01-23 LAB — CBC
HCT: 29.8 % — ABNORMAL LOW (ref 39.0–52.0)
Hemoglobin: 9.6 g/dL — ABNORMAL LOW (ref 13.0–17.0)
MCH: 28.5 pg (ref 26.0–34.0)
MCHC: 32.2 g/dL (ref 30.0–36.0)
MCV: 88.4 fL (ref 80.0–100.0)
Platelets: 271 10*3/uL (ref 150–400)
RBC: 3.37 MIL/uL — ABNORMAL LOW (ref 4.22–5.81)
RDW: 14.9 % (ref 11.5–15.5)
WBC: 9.9 10*3/uL (ref 4.0–10.5)
nRBC: 0 % (ref 0.0–0.2)

## 2023-01-23 LAB — COMPREHENSIVE METABOLIC PANEL
ALT: 13 U/L (ref 0–44)
AST: 8 U/L — ABNORMAL LOW (ref 15–41)
Albumin: 2.9 g/dL — ABNORMAL LOW (ref 3.5–5.0)
Alkaline Phosphatase: 62 U/L (ref 38–126)
Anion gap: 8 (ref 5–15)
BUN: 17 mg/dL (ref 8–23)
CO2: 22 mmol/L (ref 22–32)
Calcium: 8.5 mg/dL — ABNORMAL LOW (ref 8.9–10.3)
Chloride: 106 mmol/L (ref 98–111)
Creatinine, Ser: 1.02 mg/dL (ref 0.61–1.24)
GFR, Estimated: >60 mL/min (ref >60)
Glucose, Bld: 127 mg/dL — ABNORMAL HIGH (ref 70–99)
Potassium: 3.9 mmol/L (ref 3.5–5.1)
Sodium: 136 mmol/L (ref 135–145)
Total Bilirubin: 0.6 mg/dL (ref 0.3–1.2)
Total Protein: 6.1 g/dL — ABNORMAL LOW (ref 6.5–8.1)

## 2023-01-23 LAB — C-REACTIVE PROTEIN: CRP: 9 mg/dL — ABNORMAL HIGH (ref <1.0)

## 2023-01-23 LAB — MAGNESIUM: Magnesium: 2.2 mg/dL (ref 1.7–2.4)

## 2023-01-23 LAB — PHOSPHORUS: Phosphorus: 3 mg/dL (ref 2.5–4.6)

## 2023-01-23 MED ORDER — CELECOXIB 200 MG PO CAPS
200.0000 mg | ORAL_CAPSULE | Freq: Every day | ORAL | Status: DC
  Administered 2023-01-23 – 2023-01-24 (×2): 200 mg via ORAL
  Filled 2023-01-23 (×2): qty 1

## 2023-01-23 MED ORDER — OXYCODONE HCL 5 MG PO TABS
5.0000 mg | ORAL_TABLET | ORAL | Status: DC | PRN
  Administered 2023-01-23 – 2023-01-24 (×4): 10 mg via ORAL
  Filled 2023-01-23 (×4): qty 2

## 2023-01-23 MED ORDER — POLYETHYLENE GLYCOL 3350 17 G PO PACK: 17.0000 g | PACK | Freq: Once | ORAL | Status: DC

## 2023-01-23 MED ORDER — DOCUSATE SODIUM 100 MG PO CAPS
100.0000 mg | ORAL_CAPSULE | Freq: Two times a day (BID) | ORAL | Status: DC
  Administered 2023-01-23 – 2023-01-24 (×3): 100 mg via ORAL
  Filled 2023-01-23 (×3): qty 1

## 2023-01-23 MED ORDER — HYDROXYZINE HCL 25 MG PO TABS
25.0000 mg | ORAL_TABLET | Freq: Three times a day (TID) | ORAL | Status: DC | PRN
  Filled 2023-01-23: qty 1

## 2023-01-23 MED ORDER — HYDROXYZINE HCL 10 MG PO TABS: 10.0000 mg | ORAL_TABLET | Freq: Three times a day (TID) | ORAL | Status: DC | PRN

## 2023-01-23 NOTE — Progress Notes (Signed)
Subjective:  Patient is seen in his hospital room this morning to reevaluate his left wrist.  Patient states he is having increased pain in the left wrist.  States he went to get up to use the restroom today and had pain in his right knee causing him to fall back on the bed landing on his left wrist.  This caused increased pain in his left wrist.  Objective:   VITALS:   Vitals:   01/22/23 1408 01/22/23 1628 01/22/23 2347 01/23/23 0811  BP: (!) 146/83 136/85 (!) 153/89 (!) 163/89  Pulse: 87 83 93 86  Resp: '18 16 16 18  '$ Temp: 97.7 F (36.5 C) 99.1 F (37.3 C) 97.7 F (36.5 C) 98 F (36.7 C)  TempSrc:      SpO2: 97% 96% 96% 99%  Weight:      Height:        PHYSICAL EXAM: Left wrist: Patient has no significant swelling.  He can flex and extend his wrist to approximately 60 degrees in both directions today with mild pain.  He had normal tenderness to palpation.  He can flex and extend his digits of the left hand.  His fingers are well-perfused and he has a palpable radial pulse.  There is no significant erythema in the wrist and no streaking erythema in the forearm.  His compartments are soft and compressible.  Right knee: Patient's skin is intact.  There is no erythema ecchymosis or effusion.  He can actively flex his right knee to approximately 110 degrees and fully extend with only mild pain.  He had no significant tenderness to palpation.  Distally he was neurovascular intact.   LABS  Results for orders placed or performed during the hospital encounter of 01/22/23 (from the past 24 hour(s))  Lactic acid, plasma     Status: None   Collection Time: 01/22/23 11:31 AM  Result Value Ref Range   Lactic Acid, Venous 0.7 0.5 - 1.9 mmol/L  C-reactive protein     Status: Abnormal   Collection Time: 01/22/23  2:03 PM  Result Value Ref Range   CRP 11.1 (H) <1.0 mg/dL  CBC     Status: Abnormal   Collection Time: 01/23/23  6:33 AM  Result Value Ref Range   WBC 9.9 4.0 - 10.5 K/uL   RBC  3.37 (L) 4.22 - 5.81 MIL/uL   Hemoglobin 9.6 (L) 13.0 - 17.0 g/dL   HCT 29.8 (L) 39.0 - 52.0 %   MCV 88.4 80.0 - 100.0 fL   MCH 28.5 26.0 - 34.0 pg   MCHC 32.2 30.0 - 36.0 g/dL   RDW 14.9 11.5 - 15.5 %   Platelets 271 150 - 400 K/uL   nRBC 0.0 0.0 - 0.2 %  Comprehensive metabolic panel     Status: Abnormal   Collection Time: 01/23/23  6:33 AM  Result Value Ref Range   Sodium 136 135 - 145 mmol/L   Potassium 3.9 3.5 - 5.1 mmol/L   Chloride 106 98 - 111 mmol/L   CO2 22 22 - 32 mmol/L   Glucose, Bld 127 (H) 70 - 99 mg/dL   BUN 17 8 - 23 mg/dL   Creatinine, Ser 1.02 0.61 - 1.24 mg/dL   Calcium 8.5 (L) 8.9 - 10.3 mg/dL   Total Protein 6.1 (L) 6.5 - 8.1 g/dL   Albumin 2.9 (L) 3.5 - 5.0 g/dL   AST 8 (L) 15 - 41 U/L   ALT 13 0 - 44 U/L   Alkaline  Phosphatase 62 38 - 126 U/L   Total Bilirubin 0.6 0.3 - 1.2 mg/dL   GFR, Estimated >60 >60 mL/min   Anion gap 8 5 - 15  Magnesium     Status: None   Collection Time: 01/23/23  6:33 AM  Result Value Ref Range   Magnesium 2.2 1.7 - 2.4 mg/dL  Phosphorus     Status: None   Collection Time: 01/23/23  6:33 AM  Result Value Ref Range   Phosphorus 3.0 2.5 - 4.6 mg/dL    DG Wrist Complete Left  Result Date: 01/22/2023 CLINICAL DATA:  Gout flare. EXAM: LEFT WRIST - COMPLETE 3+ VIEW COMPARISON:  Similar study 09/05/2019 FINDINGS: There is moderate diffuse soft tissue swelling somewhat eccentric towards the dorsal and ulnar aspect. There is interval healing of the previously noted fifth metacarpal radial base fracture. There is normal bone mineralization with no evidence of an acute fracture or erosive arthropathy. There is again mild narrowing and spurring of the first [facility] joint. The most significant new finding is that the pisiform bone is more medial in position than normal and certainly more so than previously. A majority of the bone now lies medial to the plane of the ulna. Whether this is a traumatic subluxation or due to primary disease of the  flexor carpi ulnaris tendon, is unclear by plain radiography. Consultation with an orthopedic surgeon is recommended. Interosseous alignment is otherwise normal. In all other respects no further changes are seen. IMPRESSION: 1. Moderate diffuse soft tissue swelling. 2. The pisiform bone now more medial in position than normal. Whether this is a traumatic subluxation or due to primary disease of the flexor carpi ulnaris tendon is unclear by plain radiography. Consultation with an orthopedic surgeon is recommended. 3. Mild degenerative changes of the first [facility] joint. Electronically Signed   By: Telford Nab M.D.   On: 01/22/2023 07:50    Assessment/Plan:     Principal Problem:   Wrist pain, acute, left  Patient still complaining of left wrist and right knee pain.  There is no evidence of injury to the right knee.  There is no swelling or erythema to suggest active gout flare in the right knee.  Patient will have his left wrist splinted.  Continue antibiotics per hospitalist.  I have ordered oxycodone and Celebrex.  I have also spoken to the nursing staff on the floor about getting a removable wrist splint for immobilization.  I discussed this case with Dr. Feliberto Gottron, the hospitalist caring for the patient.      Thornton Park , MD 01/23/2023, 11:27 AM

## 2023-01-23 NOTE — Progress Notes (Signed)
  Progress Note   Patient: Miguel [name] [id number] DOB: 1961-01-26 DOA: 01/22/2023     0 DOS: the patient was seen and examined on 01/23/2023   Brief hospital course:  Assessment and Plan: L wrist gout flare  - IV cefazolin 2 g q8hr  - Colchicine 0.6 mg PO daily  - IV morphine 2 mg q3 hr PRN    Hypomagnesemia  - Resolved    Acute hypoNa+ - Resolved    DVT prophylaxis: Lovenox 40 mg sq daily      Subjective: Pt seen and examined at the bedside. ROM in the L wrist is improved vs yesterday, however, it is still painful. Pt does not feel comfortable going home today. IV fluids stopped. Continue antibx and colchicine.   Physical Exam: Vitals:   01/22/23 1408 01/22/23 1628 01/22/23 2347 01/23/23 0811  BP: (!) 146/83 136/85 (!) 153/89 (!) 163/89  Pulse: 87 83 93 86  Resp: 18 16 16 18   Temp: 97.7 F (36.5 C) 99.1 F (37.3 C) 97.7 F (36.5 C) 98 F (36.7 C)  TempSrc:      SpO2: 97% 96% 96% 99%  Weight:      Height:       Constitutional:      Appearance: Normal appearance.  HENT:     Head: Normocephalic.     Mouth/Throat:     Mouth: Mucous membranes are moist.  Cardiovascular:     Rate and Rhythm: RRR Pulmonary:     Effort: Pulmonary effort is normal.     Breath sounds: Normal breath sounds.  Abdominal:     General: Abdomen is flat.     Palpations: Abdomen is soft.  Musculoskeletal:     Cervical back: Neck supple.     Comments: Decreased ROM of the L wrist   Skin:    General: Skin is warm.  Neurological:     Mental Status: He is alert. Mental status is at baseline.  Psychiatric:        Mood and Affect: Mood normal.  Data Reviewed:   Disposition: Status is: Observation  Planned Discharge Destination: Home    Time spent: 35 minutes  Author: [name] , MD 01/23/2023 9:53 AM  For on call review [email or web address].

## 2023-01-24 DIAGNOSIS — M25532 Pain in left wrist: Secondary | ICD-10-CM | POA: Diagnosis not present

## 2023-01-24 LAB — COMPREHENSIVE METABOLIC PANEL
ALT: 15 U/L (ref 0–44)
AST: 13 U/L — ABNORMAL LOW (ref 15–41)
Albumin: 3 g/dL — ABNORMAL LOW (ref 3.5–5.0)
Alkaline Phosphatase: 54 U/L (ref 38–126)
Anion gap: 6 (ref 5–15)
BUN: 20 mg/dL (ref 8–23)
CO2: 23 mmol/L (ref 22–32)
Calcium: 8.6 mg/dL — ABNORMAL LOW (ref 8.9–10.3)
Chloride: 108 mmol/L (ref 98–111)
Creatinine, Ser: 1.04 mg/dL (ref 0.61–1.24)
GFR, Estimated: >60 mL/min (ref >60)
Glucose, Bld: 94 mg/dL (ref 70–99)
Potassium: 4 mmol/L (ref 3.5–5.1)
Sodium: 137 mmol/L (ref 135–145)
Total Bilirubin: 0.4 mg/dL (ref 0.3–1.2)
Total Protein: 6.1 g/dL — ABNORMAL LOW (ref 6.5–8.1)

## 2023-01-24 LAB — CBC
HCT: 30.7 % — ABNORMAL LOW (ref 39.0–52.0)
Hemoglobin: 10 g/dL — ABNORMAL LOW (ref 13.0–17.0)
MCH: 28.7 pg (ref 26.0–34.0)
MCHC: 32.6 g/dL (ref 30.0–36.0)
MCV: 88.2 fL (ref 80.0–100.0)
Platelets: 290 10*3/uL (ref 150–400)
RBC: 3.48 MIL/uL — ABNORMAL LOW (ref 4.22–5.81)
RDW: 14.9 % (ref 11.5–15.5)
WBC: 6.6 10*3/uL (ref 4.0–10.5)
nRBC: 0 % (ref 0.0–0.2)

## 2023-01-24 LAB — PHOSPHORUS: Phosphorus: 3.9 mg/dL (ref 2.5–4.6)

## 2023-01-24 LAB — C-REACTIVE PROTEIN: CRP: 2.8 mg/dL — ABNORMAL HIGH (ref <1.0)

## 2023-01-24 LAB — MAGNESIUM: Magnesium: 1.8 mg/dL (ref 1.7–2.4)

## 2023-01-24 MED ORDER — DOCUSATE SODIUM 100 MG PO CAPS: 100.0000 mg | ORAL_CAPSULE | Freq: Two times a day (BID) | ORAL | 0 refills | Status: DC

## 2023-01-24 MED ORDER — ALLOPURINOL 100 MG PO TABS: 100.0000 mg | ORAL_TABLET | Freq: Every day | ORAL | 0 refills | Status: DC

## 2023-01-24 MED ORDER — CEFDINIR 300 MG PO CAPS: 300.0000 mg | ORAL_CAPSULE | Freq: Two times a day (BID) | ORAL | 0 refills | Status: AC

## 2023-01-24 MED ORDER — COLCHICINE 0.6 MG PO TABS: 0.6000 mg | ORAL_TABLET | Freq: Every day | ORAL | 0 refills | Status: DC

## 2023-01-24 MED ORDER — CELECOXIB 200 MG PO CAPS: 200.0000 mg | ORAL_CAPSULE | Freq: Every day | ORAL | 0 refills | Status: AC

## 2023-01-24 MED ORDER — OXYCODONE HCL 5 MG PO TABS: 5.0000 mg | ORAL_TABLET | Freq: Four times a day (QID) | ORAL | 0 refills | Status: AC | PRN

## 2023-01-24 NOTE — Progress Notes (Signed)
Discharge instructions reviewed with patient. He voiced understanding of instructions. Brace applied prior to discharge. Taxi company called for transportation to pharmacy to pick up medications and home .

## 2023-01-24 NOTE — Discharge Summary (Signed)
Physician Discharge Summary   Patient: Miguel [name] MRN: [id number] DOB: December 12, 1960  Admit date:     01/22/2023  Discharge date: 01/24/23  Discharge Physician: [name]    PCP: Pcp, No   Recommendations at discharge:    Please take the medications as prescribed   Discharge Diagnoses: Principal Problem:   Wrist pain, acute, left  Resolved Problems:   * No resolved hospital problems. *  Hospital Course: 62 yo M admitted for L wrist gout flare. Pt had an arthrocentesis completed in the ER which showed no growth of organisms.  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 Pt was started on IV cefazolin 2 g q8hr, colchicine, and IV morphine for pain control. Electrolytes were replaced as needed. Na+ improved with IV fluids. Orthopedics was consulted and placed the pt on celebrex 200 mg PO daily and oxycodone PRN. Pt's WBC imrpoved from 10.9 -->6.6. CRP improved from 11.1 -->2.8. His pain improved with the aforementioned treatment. Pt will be discharged on 01/24/2023 with 3 more days of PO antibx and 3 more days of PO colchicine. A script for allopurinol 100 mg PO daily was also written at discharge (to help prevent future gout attacks).  Assessment and Plan: No notes have been filed under this hospital service. Service: Hospitalist       Consultants: Orthopedic  Procedures performed: Arthrocentesis   Disposition: Home Diet recommendation: Low purine diet  DISCHARGE MEDICATION: Allergies as of 01/24/2023   No Known Allergies      Medication List     TAKE these medications    ascorbic acid 500 MG tablet Commonly known as: VITAMIN C Take by mouth.   Breo Ellipta 200-25 MCG/ACT Aepb Generic drug: fluticasone furoate-vilanterol Inhale one puff by mouth daily   cefdinir 300 MG capsule Commonly known as: OMNICEF Take 1 capsule (300 mg total) by mouth 2 (two) times daily for 3 days.   celecoxib 200 MG capsule Commonly known as: CELEBREX Take 1  capsule (200 mg total) by mouth daily for 3 days. Start taking on: January 25, 2023   colchicine 0.6 MG tablet Take 1 tablet (0.6 mg total) by mouth daily for 3 days. Start taking on: January 25, 2023   cyanocobalamin 1000 MCG tablet Take 1 tablet (1,000 mcg total) by mouth daily.   docusate sodium 100 MG capsule Commonly known as: COLACE Take 1 capsule (100 mg total) by mouth 2 (two) times daily.   folic acid 1 MG tablet Commonly known as: FOLVITE TAKE ONE TABLET BY MOUTH ONCE EVERY DAY.   hydrOXYzine 25 MG tablet Commonly known as: ATARAX Take 1 tablet (25 mg total) by mouth 3 (three) times daily as needed for anxiety.   lisinopril 40 MG tablet Commonly known as: ZESTRIL TAKE ONE TABLET BY MOUTH ONCE EVERY DAY. What changed: how much to take   metoprolol tartrate 25 MG tablet Commonly known as: LOPRESSOR Take 1 tablet (25 mg total) by mouth once daily.   omeprazole 20 MG capsule Commonly known as: PRILOSEC Take 20 mg by mouth daily.   oxyCODONE 5 MG immediate release tablet Commonly known as: Oxy IR/ROXICODONE Take 1 tablet (5 mg total) by mouth every 6 (six) hours as needed for up to 2 days for moderate pain.   polyethylene glycol powder 17 GM/SCOOP powder Commonly known as: MiraLax Take 17 g by mouth 2 (two) times daily as needed for moderate constipation.   predniSONE 20 MG tablet Commonly known as: DELTASONE Take 2 tablets (40 mg  total) by mouth daily with breakfast for 4 days.   ProAir HFA 108 (90 Base) MCG/ACT inhaler Generic drug: albuterol Inhale 2 puffs into the lungs once every 4 (four) hours as needed.   senna-docusate 8.6-50 MG tablet Commonly known as: Senokot-S Take 1 tablet by mouth at bedtime as needed for mild constipation.   sertraline 25 MG tablet Commonly known as: ZOLOFT Take 1 tablet (25 mg total) by mouth at bedtime.   thiamine 100 MG tablet Commonly known as: VITAMIN B1 Take 1 tablet (100 mg total) by mouth daily.         Discharge Exam: Filed Weights   01/22/23 0516  Weight: 83.9 kg    Condition at discharge: good  The results of significant diagnostics from this hospitalization (including imaging, microbiology, ancillary and laboratory) are listed below for reference.   Imaging Studies: DG Wrist Complete Left  Result Date: 01/22/2023 CLINICAL DATA:  Gout flare. EXAM: LEFT WRIST - COMPLETE 3+ VIEW COMPARISON:  Similar study 09/05/2019 FINDINGS: There is moderate diffuse soft tissue swelling somewhat eccentric towards the dorsal and ulnar aspect. There is interval healing of the previously noted fifth metacarpal radial base fracture. There is normal bone mineralization with no evidence of an acute fracture or erosive arthropathy. There is again mild narrowing and spurring of the first [facility] joint. The most significant new finding is that the pisiform bone is more medial in position than normal and certainly more so than previously. A majority of the bone now lies medial to the plane of the ulna. Whether this is a traumatic subluxation or due to primary disease of the flexor carpi ulnaris tendon, is unclear by plain radiography. Consultation with an orthopedic surgeon is recommended. Interosseous alignment is otherwise normal. In all other respects no further changes are seen. IMPRESSION: 1. Moderate diffuse soft tissue swelling. 2. The pisiform bone now more medial in position than normal. Whether this is a traumatic subluxation or due to primary disease of the flexor carpi ulnaris tendon is unclear by plain radiography. Consultation with an orthopedic surgeon is recommended. 3. Mild degenerative changes of the first [facility] joint. Electronically Signed   By: Telford Nab M.D.   On: 01/22/2023 07:50    Microbiology: Results for orders placed or performed during the hospital encounter of 01/22/23  Body fluid culture w Gram Stain     Status: None (Preliminary result)   Collection Time: 01/22/23  9:50 AM   Specimen:  Synovium; Body Fluid  Result Value Ref Range Status   Specimen Description   Final    SYNOVIAL Performed at [facility], [address] [place], [place] [address]    Special Requests   Final    WRIST Performed at [facility], [address] [place], [place] [address]    Gram Stain   Final    FEW WBC PRESENT, PREDOMINANTLY PMN NO ORGANISMS SEEN    Culture   Final    NO GROWTH 2 DAYS Performed at [facility] [address], [place], [place] [address]    Report Status PENDING  Incomplete  Blood culture (routine x 2)     Status: None (Preliminary result)   Collection Time: 01/22/23 10:09 AM   Specimen: BLOOD  Result Value Ref Range Status   Specimen Description BLOOD RIGHT HAND  Final   Special Requests   Final    BOTTLES DRAWN AEROBIC AND ANAEROBIC Blood Culture adequate volume   Culture   Final    NO  GROWTH 2 DAYS Performed at [facility], [address] [place], [place] [address]    Report Status PENDING  Incomplete  Blood culture (routine x 2)     Status: None (Preliminary result)   Collection Time: 01/22/23 10:10 AM   Specimen: BLOOD  Result Value Ref Range Status   Specimen Description BLOOD RIGHT WRIST  Final   Special Requests   Final    BOTTLES DRAWN AEROBIC AND ANAEROBIC Blood Culture adequate volume   Culture   Final    NO GROWTH 2 DAYS Performed at [facility], [place], [place] [address]    Report Status PENDING  Incomplete    Labs: CBC: Recent Labs  Lab 01/18/23 1649 01/19/23 0451 01/20/23 0536 01/22/23 0534 01/23/23 0633 01/24/23 0434  WBC 9.9 8.3 10.3 10.9* 9.9 6.6  NEUTROABS 7.3  --   --  7.8*  --   --   HGB 12.4* 10.6* 10.6* 11.1* 9.6* 10.0*  HCT 41.1 32.6* 32.5* 34.9* 29.8* 30.7*  MCV 95.6 87.4 88.3 89.0 88.4 88.2  PLT 204 216 213 250 271 [id number]   Basic Metabolic Panel: Recent Labs  Lab 01/18/23 2325 01/19/23 0451 01/20/23 0536 01/22/23 0534 01/23/23 0633  01/24/23 0434  NA  --  133* 136 133* 136 137  K 4.5 4.9 4.7 4.1 3.9 4.0  CL  --  111 110 101 106 108  CO2  --  18* 19* 21* 22 23  GLUCOSE  --  117* 134* 177* 127* 94  BUN  --  29* '17 15 17 20  '$ CREATININE  --  1.29* 1.06 1.10 1.02 1.04  CALCIUM  --  8.6* 8.9 9.0 8.5* 8.6*  MG 1.8  --  1.5* 1.4* 2.2 1.8  PHOS  --   --   --   --  3.0 3.9   Liver Function Tests: Recent Labs  Lab 01/18/23 1649 01/23/23 0633 01/24/23 0434  AST 21 8* 13*  ALT '12 13 15  '$ ALKPHOS 69 62 54  BILITOT 1.0 0.6 0.4  PROT 7.2 6.1* 6.1*  ALBUMIN 3.9 2.9* 3.0*   CBG: Recent Labs  Lab 01/18/23 2100 01/18/23 2240  GLUCAP 115* 143*    Discharge time spent: greater than 30 minutes.  Signed: [name] , MD [facility] 01/24/2023

## 2023-01-24 NOTE — Plan of Care (Signed)

## 2023-01-24 NOTE — Progress Notes (Signed)
Subjective:  Patient feeling better today.  Still has mild pain in the right knee but his left wrist pain is much improved.  Objective:   VITALS:   Vitals:   01/23/23 0811 01/23/23 1550 01/23/23 2354 01/24/23 0748  BP: (!) 163/89 130/82 (!) 140/87 (!) 143/98  Pulse: 86 93 84 82  Resp: '18 18 18 16  '$ Temp: 98 F (36.7 C) 98.2 F (36.8 C) 97.8 F (36.6 C) 98.1 F (36.7 C)  TempSrc:      SpO2: 99% 97% 97% 96%  Weight:      Height:        PHYSICAL EXAM: Left wrist: Patient has no erythema ecchymosis or welling today.  He can flex and extend approximately 60 to 70 degrees.  He can pronate and supinate approximately 80 degrees.  He has intact sensation light touch in all 5 digits of the left hand and a palpable radial pulse.  His fingers are well-perfused.  He has full digital range of motion.  Right knee: Patient has active range of motion without erythema ecchymosis or effusion.  He remains neurovascular intact.  LABS  Results for orders placed or performed during the hospital encounter of 01/22/23 (from the past 24 hour(s))  CBC     Status: Abnormal   Collection Time: 01/24/23  4:34 AM  Result Value Ref Range   WBC 6.6 4.0 - 10.5 K/uL   RBC 3.48 (L) 4.22 - 5.81 MIL/uL   Hemoglobin 10.0 (L) 13.0 - 17.0 g/dL   HCT 30.7 (L) 39.0 - 52.0 %   MCV 88.2 80.0 - 100.0 fL   MCH 28.7 26.0 - 34.0 pg   MCHC 32.6 30.0 - 36.0 g/dL   RDW 14.9 11.5 - 15.5 %   Platelets 290 150 - 400 K/uL   nRBC 0.0 0.0 - 0.2 %  Comprehensive metabolic panel     Status: Abnormal   Collection Time: 01/24/23  4:34 AM  Result Value Ref Range   Sodium 137 135 - 145 mmol/L   Potassium 4.0 3.5 - 5.1 mmol/L   Chloride 108 98 - 111 mmol/L   CO2 23 22 - 32 mmol/L   Glucose, Bld 94 70 - 99 mg/dL   BUN 20 8 - 23 mg/dL   Creatinine, Ser 1.04 0.61 - 1.24 mg/dL   Calcium 8.6 (L) 8.9 - 10.3 mg/dL   Total Protein 6.1 (L) 6.5 - 8.1 g/dL   Albumin 3.0 (L) 3.5 - 5.0 g/dL   AST 13 (L) 15 - 41 U/L   ALT 15 0 - 44 U/L    Alkaline Phosphatase 54 38 - 126 U/L   Total Bilirubin 0.4 0.3 - 1.2 mg/dL   GFR, Estimated >60 >60 mL/min   Anion gap 6 5 - 15  Magnesium     Status: None   Collection Time: 01/24/23  4:34 AM  Result Value Ref Range   Magnesium 1.8 1.7 - 2.4 mg/dL  Phosphorus     Status: None   Collection Time: 01/24/23  4:34 AM  Result Value Ref Range   Phosphorus 3.9 2.5 - 4.6 mg/dL  C-reactive protein     Status: Abnormal   Collection Time: 01/24/23  4:34 AM  Result Value Ref Range   CRP 2.8 (H) <1.0 mg/dL    No results found.  Assessment/Plan:     Principal Problem:   Wrist pain, acute, left Gout flare  Patient's left wrist pain was the result of a flareup of underlying gout.  Patient  would like to be discharged on Celebrex but I started form yesterday.  Hospitalist has sent in some Celebrex to his pharmacy but for limited time as he had a recent episode of acute kidney insufficiency.  He will continue using his wrist brace as needed.  He may follow-up in our office in 7 to 10 days if he has persistent pain.  Patient should also discuss preventative treatment for gout flares with his PCP.  He may be a candidate to start allopurinol.     Thornton Park , MD 01/24/2023, 2:14 PM

## 2023-01-25 LAB — BODY FLUID CULTURE W GRAM STAIN: Culture: NO GROWTH

## 2023-01-27 LAB — CULTURE, BLOOD (ROUTINE X 2)
Culture: NO GROWTH
Culture: NO GROWTH
Special Requests: ADEQUATE
Special Requests: ADEQUATE

## 2023-04-06 ENCOUNTER — Encounter: Payer: Self-pay | Admitting: Emergency Medicine

## 2023-04-06 ENCOUNTER — Emergency Department
Admission: EM | Admit: 2023-04-06 | Discharge: 2023-04-07 | Disposition: A | Discharge status: Other Acute Inpatient Hospital | Payer: No Typology Code available for payment source | Attending: Emergency Medicine | Admitting: Emergency Medicine

## 2023-04-06 ENCOUNTER — Other Ambulatory Visit: Payer: Self-pay

## 2023-04-06 DIAGNOSIS — Y908 Blood alcohol level of 240 mg/100 ml or more: Secondary | ICD-10-CM | POA: Insufficient documentation

## 2023-04-06 DIAGNOSIS — F429 Obsessive-compulsive disorder, unspecified: Secondary | ICD-10-CM | POA: Insufficient documentation

## 2023-04-06 DIAGNOSIS — N179 Acute kidney failure, unspecified: Secondary | ICD-10-CM | POA: Insufficient documentation

## 2023-04-06 DIAGNOSIS — R350 Frequency of micturition: Secondary | ICD-10-CM | POA: Insufficient documentation

## 2023-04-06 DIAGNOSIS — E875 Hyperkalemia: Secondary | ICD-10-CM | POA: Insufficient documentation

## 2023-04-06 DIAGNOSIS — R45851 Suicidal ideations: Secondary | ICD-10-CM | POA: Insufficient documentation

## 2023-04-06 DIAGNOSIS — Z87891 Personal history of nicotine dependence: Secondary | ICD-10-CM | POA: Insufficient documentation

## 2023-04-06 DIAGNOSIS — S61512A Laceration without foreign body of left wrist, initial encounter: Secondary | ICD-10-CM | POA: Insufficient documentation

## 2023-04-06 DIAGNOSIS — J45909 Unspecified asthma, uncomplicated: Secondary | ICD-10-CM | POA: Insufficient documentation

## 2023-04-06 DIAGNOSIS — F12188 Cannabis abuse with other cannabis-induced disorder: Secondary | ICD-10-CM | POA: Insufficient documentation

## 2023-04-06 DIAGNOSIS — R780 Finding of alcohol in blood: Secondary | ICD-10-CM | POA: Insufficient documentation

## 2023-04-06 DIAGNOSIS — I10 Essential (primary) hypertension: Secondary | ICD-10-CM | POA: Insufficient documentation

## 2023-04-06 DIAGNOSIS — K219 Gastro-esophageal reflux disease without esophagitis: Secondary | ICD-10-CM | POA: Insufficient documentation

## 2023-04-06 DIAGNOSIS — X789XXA Intentional self-harm by unspecified sharp object, initial encounter: Secondary | ICD-10-CM

## 2023-04-06 DIAGNOSIS — F331 Major depressive disorder, recurrent, moderate: Secondary | ICD-10-CM | POA: Insufficient documentation

## 2023-04-06 DIAGNOSIS — X781XXA Intentional self-harm by knife, initial encounter: Secondary | ICD-10-CM | POA: Insufficient documentation

## 2023-04-06 DIAGNOSIS — R3589 Other polyuria: Secondary | ICD-10-CM | POA: Insufficient documentation

## 2023-04-06 DIAGNOSIS — F4010 Social phobia, unspecified: Secondary | ICD-10-CM | POA: Insufficient documentation

## 2023-04-06 LAB — COMPREHENSIVE METABOLIC PANEL
ALT: 18 U/L (ref 0–44)
AST: 17 U/L (ref 15–41)
Albumin: 4.3 g/dL (ref 3.5–5.0)
Alkaline Phosphatase: 65 U/L (ref 38–126)
Anion gap: 11 (ref 5–15)
BUN: 20 mg/dL (ref 8–23)
CO2: 23 mmol/L (ref 22–32)
Calcium: 9.3 mg/dL (ref 8.9–10.3)
Chloride: 111 mmol/L (ref 98–111)
Creatinine, Ser: 1.24 mg/dL (ref 0.61–1.24)
GFR, Estimated: >60 mL/min (ref >60)
Glucose, Bld: 90 mg/dL (ref 70–99)
Potassium: 4.3 mmol/L (ref 3.5–5.1)
Sodium: 145 mmol/L (ref 135–145)
Total Bilirubin: 0.9 mg/dL (ref 0.3–1.2)
Total Protein: 7.8 g/dL (ref 6.5–8.1)

## 2023-04-06 LAB — URINE DRUG SCREEN, QUALITATIVE (ARMC ONLY)
Amphetamines, Ur Screen: NOT DETECTED
Barbiturates, Ur Screen: NOT DETECTED
Benzodiazepine, Ur Scrn: NOT DETECTED
Cannabinoid 50 Ng, Ur ~~LOC~~: POSITIVE — AB
Cocaine Metabolite,Ur ~~LOC~~: NOT DETECTED
MDMA (Ecstasy)Ur Screen: NOT DETECTED
Methadone Scn, Ur: NOT DETECTED
Opiate, Ur Screen: NOT DETECTED
Phencyclidine (PCP) Ur S: NOT DETECTED
Tricyclic, Ur Screen: NOT DETECTED

## 2023-04-06 LAB — CBC
HCT: 40.7 % (ref 39.0–52.0)
Hemoglobin: 12.7 g/dL — ABNORMAL LOW (ref 13.0–17.0)
MCH: 29.3 pg (ref 26.0–34.0)
MCHC: 31.2 g/dL (ref 30.0–36.0)
MCV: 94 fL (ref 80.0–100.0)
Platelets: 355 10*3/uL (ref 150–400)
RBC: 4.33 MIL/uL (ref 4.22–5.81)
RDW: 14.3 % (ref 11.5–15.5)
WBC: 9.2 10*3/uL (ref 4.0–10.5)
nRBC: 0 % (ref 0.0–0.2)

## 2023-04-06 LAB — ETHANOL: Alcohol, Ethyl (B): 280 mg/dL — ABNORMAL HIGH (ref <10)

## 2023-04-06 LAB — ACETAMINOPHEN LEVEL: Acetaminophen (Tylenol), Serum: <10 ug/mL — ABNORMAL LOW (ref 10–30)

## 2023-04-06 LAB — SALICYLATE LEVEL: Salicylate Lvl: <7 mg/dL — ABNORMAL LOW (ref 7.0–30.0)

## 2023-04-06 MED ORDER — TRAZODONE HCL 50 MG PO TABS
50.0000 mg | ORAL_TABLET | Freq: Every evening | ORAL | Status: DC | PRN
  Administered 2023-04-06: 100 mg via ORAL
  Filled 2023-04-06: qty 2

## 2023-04-06 MED ORDER — METOPROLOL TARTRATE 25 MG PO TABS
25.0000 mg | ORAL_TABLET | Freq: Every day | ORAL | Status: DC
  Administered 2023-04-07: 25 mg via ORAL
  Filled 2023-04-06: qty 1

## 2023-04-06 MED ORDER — ALBUTEROL SULFATE (2.5 MG/3ML) 0.083% IN NEBU: 2.5000 mg | INHALATION_SOLUTION | RESPIRATORY_TRACT | Status: DC | PRN

## 2023-04-06 MED ORDER — ALLOPURINOL 100 MG PO TABS
100.0000 mg | ORAL_TABLET | Freq: Every day | ORAL | Status: DC
  Administered 2023-04-07: 100 mg via ORAL
  Filled 2023-04-06: qty 1

## 2023-04-06 MED ORDER — FLUOXETINE HCL 20 MG PO CAPS
20.0000 mg | ORAL_CAPSULE | Freq: Every morning | ORAL | Status: DC
  Administered 2023-04-07: 20 mg via ORAL
  Filled 2023-04-06: qty 1

## 2023-04-06 MED ORDER — SENNOSIDES-DOCUSATE SODIUM 8.6-50 MG PO TABS: 1.0000 | ORAL_TABLET | Freq: Every evening | ORAL | Status: DC | PRN

## 2023-04-06 MED ORDER — HYDROXYZINE HCL 25 MG PO TABS
25.0000 mg | ORAL_TABLET | Freq: Three times a day (TID) | ORAL | Status: DC | PRN
  Administered 2023-04-06: 25 mg via ORAL
  Filled 2023-04-06: qty 1

## 2023-04-06 NOTE — ED Triage Notes (Signed)
Pt presents ambulatory to triage under IVC with [employer] for a suicide attempt. Per paperwork, the patients wife stated she was going to leave him and the patient took a knife and slit his L wrist; bleeding well controlled with gauze and curlex - superficial lac. Pt states that this was a mistake and he was just upset with her wanting to leave. Pt denies SI/HI at this time. A&Ox4 at this time. Denies CP or SOB.

## 2023-04-06 NOTE — ED Notes (Signed)
Patient demanding his medications, food, and drink. Patient states I always get my medication as soon as I come here. Patient given food tray and drink. EDP Dr. Marisa Severin aware of demands.

## 2023-04-06 NOTE — ED Notes (Signed)
ivc prior to arrival/psych consult ordered/pending... 

## 2023-04-06 NOTE — ED Notes (Signed)
Pt dressed out by this RN, [employer], and EDT Research officer, trade union), belongings include:   Black Scientist, research (medical) Black cell phone

## 2023-04-06 NOTE — ED Provider Notes (Signed)
[facility] Provider Note    Event Date/Time   First MD Initiated Contact with Patient 04/06/23 2128     (approximate)   History   Psychiatric Evaluation   HPI  Miguel [name] is a 62 y.o. male with a history of hypertension, GERD, anxiety, chronic abdominal pain, and former alcohol abuse who presents with a self inflicted wound to his wrist.  The patient states that his wife threatened to leave him, he got upset, and then cut his wrist.  He states that he does not have any continued thoughts of wanting to harm himself and has never harmed himself before.  He states that he has been drinking over the last several days after being sober for a year.  I reviewed past medical records.  The patient was most recently admitted in March with a gout flare to his left wrist.  He was admitted earlier that month for hyperkalemia, and the previous month for GI.   Physical Exam   Triage Vital Signs: ED Triage Vitals  Enc Vitals Group     BP 04/06/23 2106 123/86     Pulse Rate 04/06/23 2106 (!) 103     Resp 04/06/23 2106 18     Temp 04/06/23 2106 98 F (36.7 C)     Temp Source 04/06/23 2106 Oral     SpO2 04/06/23 2106 98 %     Weight 04/06/23 2105 185 lb (83.9 kg)     Height 04/06/23 2105 6' (1.829 m)     Head Circumference --      Peak Flow --      Pain Score 04/06/23 2106 0     Pain Loc --      Pain Edu? --      Excl. in GC? --     Most recent vital signs: Vitals:   04/06/23 2106  BP: 123/86  Pulse: (!) 103  Resp: 18  Temp: 98 F (36.7 C)  SpO2: 98%     General: Awake, no distress.  CV:  Good peripheral perfusion.  Resp:  Normal effort.  Abd:  No distention.  Other:  5 cm superficial laceration to the volar aspect of the left wrist.   ED Results / Procedures / Treatments   Labs (all labs ordered are listed, but only abnormal results are displayed) Labs Reviewed  ETHANOL - Abnormal; Notable for the following components:      Result Value    Alcohol, Ethyl (B) 280 (*)    All other components within normal limits  SALICYLATE LEVEL - Abnormal; Notable for the following components:   Salicylate Lvl <7.0 (*)    All other components within normal limits  ACETAMINOPHEN LEVEL - Abnormal; Notable for the following components:   Acetaminophen (Tylenol), Serum <10 (*)    All other components within normal limits  CBC - Abnormal; Notable for the following components:   Hemoglobin 12.7 (*)    All other components within normal limits  URINE DRUG SCREEN, QUALITATIVE ([facility] ONLY) - Abnormal; Notable for the following components:   Cannabinoid 50 Ng, [place] POSITIVE (*)    All other components within normal limits  COMPREHENSIVE METABOLIC PANEL     EKG     RADIOLOGY    PROCEDURES:  Critical Care performed: No  ..Laceration Repair  Date/Time: 04/06/2023 10:38 PM  Performed by: Dionne Bucy, MD Authorized by: Dionne Bucy, MD   Consent:    Consent obtained:  Verbal   Consent given by:  Patient   Alternatives discussed:  No treatment Universal protocol:    Patient identity confirmed:  Verbally with patient Anesthesia:    Anesthesia method:  None Laceration details:    Location:  Shoulder/arm   Shoulder/arm location:  L lower arm   Length (cm):  5   Depth (mm):  2 Treatment:    Area cleansed with:  Chlorhexidine Skin repair:    Repair method:  Tissue adhesive (Derma clip) Approximation:    Approximation:  Close Repair type:    Repair type:  Simple Post-procedure details:    Dressing:  Open (no dressing)   Procedure completion:  Tolerated well, no immediate complications    MEDICATIONS ORDERED IN ED: Medications  allopurinol (ZYLOPRIM) tablet 100 mg (has no administration in time range)  FLUoxetine (PROZAC) capsule 20 mg (has no administration in time range)  hydrOXYzine (ATARAX) tablet 25 mg (25 mg Oral Given 04/06/23 2253)  metoprolol tartrate (LOPRESSOR) tablet 25 mg (has no administration in  time range)  senna-docusate (Senokot-S) tablet 1 tablet (has no administration in time range)  traZODone (DESYREL) tablet 50-100 mg (100 mg Oral Given 04/06/23 2254)  albuterol (PROVENTIL) (2.5 MG/3ML) 0.083% nebulizer solution 2.5 mg (has no administration in time range)     IMPRESSION / MDM / ASSESSMENT AND PLAN / ED COURSE  I reviewed the triage vital signs and the nursing notes.  62 year old male with PMH as noted above presents with a self-inflicted superficial laceration to the left wrist.  He denies continued SI.  He presents under involuntary commitment.  I repaired the laceration using derma clips.    Differential diagnosis includes, but is not limited to, major depressive disorder, adjustment disorder, substance-induced mood disorder.  I have ordered psychiatry and TTS consults as well as lab workup for medical clearance.  Ethanol level is 280.  Acetaminophen and salicylate levels are negative.  UDS is positive for cannabinoids.  Patient's presentation is most consistent with acute presentation with potential threat to life or bodily function.  The patient has been placed in psychiatric observation due to the need to provide a safe environment for the patient while obtaining psychiatric consultation and evaluation, as well as ongoing medical and medication management to treat the patient's condition.  The patient has been placed under full IVC at this time.   ----------------------------------------- 11:19 PM on 04/06/2023 -----------------------------------------  Psychiatry consult is pending.   FINAL CLINICAL IMPRESSION(S) / ED DIAGNOSES   Final diagnoses:  Self-inflicted laceration of left wrist (HCC)     Rx / DC Orders   ED Discharge Orders     None        Note:  This document was prepared using Dragon voice recognition software and may include unintentional dictation errors.   Dionne Bucy, MD 04/06/23 [phone]

## 2023-04-07 ENCOUNTER — Inpatient Hospital Stay
Admission: AD | Admit: 2023-04-07 | Discharge: 2023-04-07 | DRG: 897 | Disposition: A | Discharge status: Home / Self Care | Payer: No Typology Code available for payment source | Source: Intra-hospital | Attending: Psychiatry | Admitting: Psychiatry

## 2023-04-07 DIAGNOSIS — X781XXA Intentional self-harm by knife, initial encounter: Secondary | ICD-10-CM | POA: Diagnosis present

## 2023-04-07 DIAGNOSIS — R45851 Suicidal ideations: Secondary | ICD-10-CM

## 2023-04-07 DIAGNOSIS — F1014 Alcohol abuse with alcohol-induced mood disorder: Secondary | ICD-10-CM | POA: Diagnosis present

## 2023-04-07 DIAGNOSIS — F331 Major depressive disorder, recurrent, moderate: Secondary | ICD-10-CM | POA: Diagnosis present

## 2023-04-07 DIAGNOSIS — I10 Essential (primary) hypertension: Secondary | ICD-10-CM | POA: Diagnosis present

## 2023-04-07 DIAGNOSIS — Z87891 Personal history of nicotine dependence: Secondary | ICD-10-CM

## 2023-04-07 DIAGNOSIS — S61512A Laceration without foreign body of left wrist, initial encounter: Secondary | ICD-10-CM | POA: Diagnosis present

## 2023-04-07 DIAGNOSIS — R3589 Other polyuria: Secondary | ICD-10-CM | POA: Diagnosis present

## 2023-04-07 DIAGNOSIS — K219 Gastro-esophageal reflux disease without esophagitis: Secondary | ICD-10-CM | POA: Diagnosis present

## 2023-04-07 DIAGNOSIS — N179 Acute kidney failure, unspecified: Secondary | ICD-10-CM | POA: Diagnosis present

## 2023-04-07 DIAGNOSIS — E875 Hyperkalemia: Secondary | ICD-10-CM | POA: Diagnosis present

## 2023-04-07 DIAGNOSIS — F4010 Social phobia, unspecified: Secondary | ICD-10-CM | POA: Diagnosis present

## 2023-04-07 DIAGNOSIS — G4700 Insomnia, unspecified: Secondary | ICD-10-CM | POA: Diagnosis present

## 2023-04-07 DIAGNOSIS — R780 Finding of alcohol in blood: Secondary | ICD-10-CM | POA: Diagnosis present

## 2023-04-07 DIAGNOSIS — X789XXA Intentional self-harm by unspecified sharp object, initial encounter: Secondary | ICD-10-CM | POA: Diagnosis present

## 2023-04-07 DIAGNOSIS — J45909 Unspecified asthma, uncomplicated: Secondary | ICD-10-CM | POA: Diagnosis present

## 2023-04-07 DIAGNOSIS — R350 Frequency of micturition: Secondary | ICD-10-CM | POA: Diagnosis present

## 2023-04-07 DIAGNOSIS — F429 Obsessive-compulsive disorder, unspecified: Secondary | ICD-10-CM | POA: Diagnosis present

## 2023-04-07 DIAGNOSIS — F411 Generalized anxiety disorder: Secondary | ICD-10-CM | POA: Diagnosis present

## 2023-04-07 DIAGNOSIS — F12188 Cannabis abuse with other cannabis-induced disorder: Secondary | ICD-10-CM | POA: Diagnosis present

## 2023-04-07 DIAGNOSIS — Y908 Blood alcohol level of 240 mg/100 ml or more: Secondary | ICD-10-CM | POA: Diagnosis present

## 2023-04-07 MED ORDER — DIPHENHYDRAMINE HCL 50 MG/ML IJ SOLN: 50.0000 mg | Freq: Three times a day (TID) | INTRAMUSCULAR | Status: DC | PRN

## 2023-04-07 MED ORDER — MAGNESIUM HYDROXIDE 400 MG/5ML PO SUSP: 30.0000 mL | Freq: Every day | ORAL | Status: DC | PRN

## 2023-04-07 MED ORDER — LORAZEPAM 2 MG/ML IJ SOLN: 2.0000 mg | Freq: Three times a day (TID) | INTRAMUSCULAR | Status: DC | PRN

## 2023-04-07 MED ORDER — LORAZEPAM 2 MG PO TABS: 2.0000 mg | ORAL_TABLET | Freq: Three times a day (TID) | ORAL | Status: DC | PRN

## 2023-04-07 MED ORDER — ACETAMINOPHEN 325 MG PO TABS: 650.0000 mg | ORAL_TABLET | Freq: Four times a day (QID) | ORAL | Status: DC | PRN

## 2023-04-07 MED ORDER — HALOPERIDOL LACTATE 5 MG/ML IJ SOLN: 5.0000 mg | Freq: Three times a day (TID) | INTRAMUSCULAR | Status: DC | PRN

## 2023-04-07 MED ORDER — ALUM & MAG HYDROXIDE-SIMETH 200-200-20 MG/5ML PO SUSP: 30.0000 mL | ORAL | Status: DC | PRN

## 2023-04-07 MED ORDER — DIPHENHYDRAMINE HCL 25 MG PO CAPS: 50.0000 mg | ORAL_CAPSULE | Freq: Three times a day (TID) | ORAL | Status: DC | PRN

## 2023-04-07 MED ORDER — HALOPERIDOL 5 MG PO TABS: 5.0000 mg | ORAL_TABLET | Freq: Three times a day (TID) | ORAL | Status: DC | PRN

## 2023-04-07 NOTE — [facility] Suicide Risk Assessment (Signed)
[facility] Discharge Suicide Risk Assessment   Principal Problem: Alcohol abuse with alcohol-induced mood disorder [facility]) Discharge Diagnoses: Principal Problem:   Alcohol abuse with alcohol-induced mood disorder (HCC) Active Problems:   Self-inflicted laceration of left wrist (HCC)   Total Time spent with patient: 30 minutes  Musculoskeletal: Strength & Muscle Tone: within normal limits Gait & Station: normal Patient leans: N/A  Psychiatric Specialty Exam  Presentation  General Appearance:  Casual  Eye Contact: Good  Speech: Clear and Coherent  Speech Volume: Normal  Handedness:No data recorded  Mood and Affect  Mood: Anxious; Depressed  Duration of Depression Symptoms: No data recorded Affect: Congruent   Thought Process  Thought Processes: Coherent  Descriptions of Associations:Intact  Orientation:Full (Time, Place and Person)  Thought Content:WDL  History of Schizophrenia/Schizoaffective disorder:No data recorded Duration of Psychotic Symptoms:No data recorded Hallucinations:No data recorded Ideas of Reference:None  Suicidal Thoughts:No data recorded Homicidal Thoughts:No data recorded  Sensorium  Memory: Immediate Good  Judgment: Good  Insight: Good   Executive Functions  Concentration: Good  Attention Span: Good  Recall: Good  Fund of Knowledge: Good  Language: Good   Psychomotor Activity  Psychomotor Activity:No data recorded  Assets  Assets: Communication Skills; Desire for Improvement; Financial Resources/Insurance; Housing; Resilience   Sleep  Sleep:No data recorded  Physical Exam: Physical Exam Vitals reviewed.  Constitutional:      Appearance: Normal appearance.  HENT:     Head: Normocephalic and atraumatic.     Mouth/Throat:     Pharynx: Oropharynx is clear.  Eyes:     Pupils: Pupils are equal, round, and reactive to light.  Cardiovascular:     Rate and Rhythm: Normal rate and regular rhythm.   Pulmonary:     Effort: Pulmonary effort is normal.     Breath sounds: Normal breath sounds.  Abdominal:     General: Abdomen is flat.     Palpations: Abdomen is soft.  Musculoskeletal:        General: Normal range of motion.  Skin:    General: Skin is warm and dry.     Comments: Superficial cut across the left wrist which was held together with Steri-Strips.  No sign of nerve or artery involvement  Neurological:     General: No focal deficit present.     Mental Status: He is alert. Mental status is at baseline.  Psychiatric:        Attention and Perception: Attention normal.        Mood and Affect: Mood is anxious.        Speech: Speech normal.        Behavior: Behavior is cooperative.        Thought Content: Thought content normal.        Cognition and Memory: Cognition normal.        Judgment: Judgment is impulsive.    Review of Systems  Constitutional: Negative.   HENT: Negative.    Eyes: Negative.   Respiratory: Negative.    Cardiovascular: Negative.   Gastrointestinal: Negative.   Musculoskeletal: Negative.   Skin: Negative.   Neurological: Negative.   Psychiatric/Behavioral:  Positive for substance abuse. Negative for depression, hallucinations and suicidal ideas. The patient is nervous/anxious.    Blood pressure 131/86, pulse 78, temperature 98.3 F (36.8 C), temperature source Oral, SpO2 98 %. There is no height or weight on file to calculate BMI.  Mental Status Per Nursing Assessment::   On Admission:     Demographic Factors:  Male and Caucasian  Loss Factors: Financial problems/change in socioeconomic status  Historical Factors: Impulsivity  Risk Reduction Factors:   Sense of responsibility to family, Living with another person, especially a relative, and Positive social support  Continued Clinical Symptoms:  Alcohol/Substance Abuse/Dependencies  Cognitive Features That Contribute To Risk:  None    Suicide Risk:  Minimal: No identifiable  suicidal ideation.  Patients presenting with no risk factors but with morbid ruminations; may be classified as minimal risk based on the severity of the depressive symptoms    Plan Of Care/Follow-up recommendations:  Other:  Patient is currently sober and when he is sober he is not at significantly elevated risk of self-harm.  He completely denies suicidal ideation.  The cut that he made does not appear to have been intended to kill himself.  He is going to [facility] and getting follow-up treatment.  This is a chronic problem unlikely to change in the hospital.  Patient is going to be discharged home with recommended follow-up at [facility] and we have talked about his need to increase his attention to his substance problem.  Mordecai Rasmussen, MD 04/07/2023, 2:14 PM

## 2023-04-07 NOTE — Progress Notes (Signed)
Admissions note: Pt came to the ED initially because he got into an argument with his wife, relapsed on ETOH after being sober for a year, and cut his wrist due to SI. The cut is superficial and healing. Pt stated to ED RN and myself about how he made a dumb mistake and misunderstood his wife. Pt stated that he thought his wife said that she wanted to leave him but she had actually stated that she wanted to go to another rehab. Pt stated him and his wife cried on the phone with each other and apologized. Pt denied SI in the ED and during admission here. Pt anxious about his wife, as she is unable to care for herself and there is no one able to go and take care of her. Pt stated that his wife has an appointment tomorrow that they really need to go to so that she can get a lift and be able to be transferred out of her wheelchair. Pt became tearful when he was speaking of all this. Pt stated that they have a lot of stress for her MS and the rehab she just came from that has done nothing for them. MD and SW made aware of the pt and not needing to be here. Pt is being discharged today. Pt refused consents, handbook detailing the patient's rights, responsibilities, and visitor guidelines provided. Skin/belongings search completed and patient oriented to unit. Patient stable at this time. Patient given the opportunity to express concerns and ask questions. Patient given toiletries. RN will continue to monitor.    04/07/23 1300  Psych Admission Type (Psych Patients Only)  Admission Status Involuntary  Psychosocial Assessment  Patient Complaints Anxiety;Worrying  Eye Contact Fair  Facial Expression Anxious  Affect Anxious  Speech Logical/coherent  Interaction Assertive  Motor Activity Tremors  Appearance/Hygiene In scrubs;Unremarkable  Behavior Characteristics Cooperative;Appropriate to situation;Anxious  Mood Anxious;Pleasant  Thought Process  Coherency WDL  Content WDL  Delusions WDL  Perception WDL   Hallucination None reported or observed  Judgment WDL  Confusion WDL  Danger to Self  Current suicidal ideation? Denies  Danger to Others  Danger to Others None reported or observed

## 2023-04-07 NOTE — [facility] Suicide Risk Assessment (Signed)
[facility] Admission Suicide Risk Assessment   Nursing information obtained from:    Demographic factors:    Current Mental Status:    Loss Factors:    Historical Factors:    Risk Reduction Factors:     Total Time spent with patient: 45 minutes Principal Problem: Alcohol abuse with alcohol-induced mood disorder (HCC) Diagnosis:  Principal Problem:   Alcohol abuse with alcohol-induced mood disorder (HCC) Active Problems:   Self-inflicted laceration of left wrist (HCC)  Subjective Data: Patient seen and chart reviewed.  Patient well-known from previous encounters.  62 year old man with longstanding struggles with alcohol abuse.  Presented to the emergency room intoxicated after having cut himself on the left wrist superficially.  Patient denies having had any suicidal intent.  His wife had been in care facilities for over a year and only recently came back home.  Patient had been staying sober during all that time.  He just relapsed into drinking yesterday.  Blood alcohol level 280.  Patient describes this as being the worst mistake he has ever made.  Absolutely denies suicidal ideation.  He is going to [facility] and taking antidepressant medicine.  Continued Clinical Symptoms:    The "Alcohol Use Disorders Identification Test", Guidelines for Use in Primary Care, Second Edition.  World Science writer [facility]). Score between 0-7:  no or low risk or alcohol related problems. Score between 8-15:  moderate risk of alcohol related problems. Score between 16-19:  high risk of alcohol related problems. Score 20 or above:  warrants further diagnostic evaluation for alcohol dependence and treatment.   CLINICAL FACTORS:   Severe Anxiety and/or Agitation Alcohol/Substance Abuse/Dependencies   Musculoskeletal: Strength & Muscle Tone: within normal limits Gait & Station: normal Patient leans: N/A  Psychiatric Specialty Exam:  Presentation  General Appearance:  Casual  Eye  Contact: Good  Speech: Clear and Coherent  Speech Volume: Normal  Handedness:No data recorded  Mood and Affect  Mood: Anxious; Depressed  Affect: Congruent   Thought Process  Thought Processes: Coherent  Descriptions of Associations:Intact  Orientation:Full (Time, Place and Person)  Thought Content:WDL  History of Schizophrenia/Schizoaffective disorder:No data recorded Duration of Psychotic Symptoms:No data recorded Hallucinations:No data recorded Ideas of Reference:None  Suicidal Thoughts:No data recorded Homicidal Thoughts:No data recorded  Sensorium  Memory: Immediate Good  Judgment: Good  Insight: Good   Executive Functions  Concentration: Good  Attention Span: Good  Recall: Good  Fund of Knowledge: Good  Language: Good   Psychomotor Activity  Psychomotor Activity:No data recorded  Assets  Assets: Communication Skills; Desire for Improvement; Financial Resources/Insurance; Housing; Resilience   Sleep  Sleep:No data recorded   Physical Exam: Physical Exam Vitals and nursing note reviewed.  Constitutional:      Appearance: Normal appearance.  HENT:     Head: Normocephalic and atraumatic.     Mouth/Throat:     Pharynx: Oropharynx is clear.  Eyes:     Pupils: Pupils are equal, round, and reactive to light.  Cardiovascular:     Rate and Rhythm: Normal rate and regular rhythm.  Pulmonary:     Effort: Pulmonary effort is normal.     Breath sounds: Normal breath sounds.  Abdominal:     General: Abdomen is flat.     Palpations: Abdomen is soft.  Musculoskeletal:        General: Normal range of motion.  Skin:    General: Skin is warm and dry.     Comments: Superficial self-inflicted cut across the left wrist which is now  held with Steri-Strips.  No sign of nerve or artery involvement.  Neurological:     General: No focal deficit present.     Mental Status: He is alert. Mental status is at baseline.  Psychiatric:         Attention and Perception: Attention normal.        Mood and Affect: Mood is anxious.        Speech: Speech normal.        Behavior: Behavior normal.        Thought Content: Thought content normal.        Cognition and Memory: Cognition normal.        Judgment: Judgment is impulsive.    Review of Systems  Constitutional: Negative.   HENT: Negative.    Eyes: Negative.   Respiratory: Negative.    Cardiovascular: Negative.   Gastrointestinal: Negative.   Musculoskeletal: Negative.   Skin: Negative.   Neurological: Negative.   Psychiatric/Behavioral:  Positive for substance abuse. Negative for depression, hallucinations, memory loss and suicidal ideas. The patient is nervous/anxious. The patient does not have insomnia.    Blood pressure 131/86, pulse 78, temperature 98.3 F (36.8 C), temperature source Oral, SpO2 98 %. There is no height or weight on file to calculate BMI.   COGNITIVE FEATURES THAT CONTRIBUTE TO RISK:  None    SUICIDE RISK:   Minimal: No identifiable suicidal ideation.  Patients presenting with no risk factors but with morbid ruminations; may be classified as minimal risk based on the severity of the depressive symptoms  PLAN OF CARE: When sober the patient is not a significant risk of self-harm.  He is currently sober and calm and lucid.  Patient is going to be discharged home with follow-up at [facility].  I certify that inpatient services furnished can reasonably be expected to improve the patient's condition.   Mordecai Rasmussen, MD 04/07/2023, 2:11 PM

## 2023-04-07 NOTE — H&P (Signed)
Psychiatric Admission Assessment Adult  Patient Identification: Miguel [name] MRN:  [id number] Date of Evaluation:  04/07/2023 Chief Complaint:  Suicidal ideation [R45.851] Principal Diagnosis: Alcohol abuse with alcohol-induced mood disorder (HCC) Diagnosis:  Principal Problem:   Alcohol abuse with alcohol-induced mood disorder (HCC) Active Problems:   Self-inflicted laceration of left wrist (HCC)  History of Present Illness: Patient seen and chart reviewed.  Patient known from previous encounters.  62 year old man with a long history of alcohol abuse.  Presented to the emergency room with a superficial cut to his left wrist self-inflicted.  Blood alcohol level 280.  Patient says that his wife just recently came home after a long stay in hospitals rehabs and institutions.  He had felt like he was very glad to have his wife come home but as soon as she did get home it sounds like they fell to their usual bickering.  Patient had been staying sober for over a year and went out and bought some alcohol and got intoxicated.  While intoxicated cut himself on the left wrist.  Patient denies there was any suicidal intent to it.  He denies having suicidal thoughts now.  Denies that he had been feeling particularly depressed in the time before he got intoxicated.  Chronic anxiety issues especially exacerbated by taking care of his wife.  Patient is requesting discharge home.  He says he has been going to [facility] and is on prescription medicine for his anxiety and dysphoria. Associated Signs/Symptoms: Depression Symptoms:  anxiety, (Hypo) Manic Symptoms:  Impulsivity, Anxiety Symptoms:  Excessive Worry, Psychotic Symptoms:   None PTSD Symptoms: Negative Total Time spent with patient: 45 minutes  Past Psychiatric History: Patient has a long history of almost identical presentations.  For many years he was actively drinking almost continually and would frequently presented to the emergency room intoxicated having  cut himself or in some way self injured.  Almost inevitably there was no evidence he was actually trying to kill himself.  He finally managed to put together an extended period of sobriety recently.  Is the patient at risk to self? No.  Has the patient been a risk to self in the past 6 months? Yes.    Has the patient been a risk to self within the distant past? Yes.    Is the patient a risk to others? No.  Has the patient been a risk to others in the past 6 months? No.  Has the patient been a risk to others within the distant past? No.   [place] Scale:  Flowsheet Row ED from 04/06/2023 in [facility] at [facility] ED to Hosp-Admission (Discharged) from 01/22/2023 in [facility] ED to Hosp-Admission (Discharged) from 01/18/2023 in [facility]  C-SSRS RISK CATEGORY No Risk No Risk No Risk        Prior Inpatient Therapy: Yes.   If yes, describe under similar circumstances although most of his encounters have led to him being discharged from the emergency room Prior Outpatient Therapy: Yes.   If yes, describe going to [facility] although it sounds like he is really only focusing on anxiety and depression and not on alcohol use  Alcohol Screening:   Substance Abuse History in the last 12 months:  Yes.   Consequences of Substance Abuse: Patient got intoxicated yesterday.  He claims he had been sober for over a year before that.  He is quite aware that when he is drinking he is  impulsive and has a high risk of harming himself. 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    OCD (obsessive compulsive disorder)    Renal colic     Past Surgical History:  Procedure Laterality Date   CHOLECYSTECTOMY  2012   COLONOSCOPY     COLONOSCOPY N/A 12/24/2022   Procedure: COLONOSCOPY;  Surgeon: Regis Bill, MD;  Location: [facility] ENDOSCOPY;  Service: Endoscopy;  Laterality: N/A;   COLONOSCOPY WITH PROPOFOL N/A 05/28/2020   Procedure: COLONOSCOPY WITH PROPOFOL;  Surgeon: Wyline Mood, MD;  Location: [facility] ENDOSCOPY;  Service: Gastroenterology;  Laterality: N/A;   ESOPHAGOGASTRODUODENOSCOPY N/A 12/24/2022   Procedure: ESOPHAGOGASTRODUODENOSCOPY (EGD);  Surgeon: Regis Bill, MD;  Location: [facility] ENDOSCOPY;  Service: Endoscopy;  Laterality: N/A;   EXTRACORPOREAL SHOCK WAVE LITHOTRIPSY Left 01/12/2019   Procedure: EXTRACORPOREAL SHOCK WAVE LITHOTRIPSY (ESWL);  Surgeon: Sondra Come, MD;  Location: [facility] ORS;  Service: Urology;  Laterality: Left;   UPPER GI ENDOSCOPY     Family History:  Family History  Problem Relation Age of Onset   Alcohol abuse Father    Breast cancer Mother 29   Anxiety disorder Brother    Other Maternal Grandmother        unknown medical history   Other Maternal Grandfather        unknow medical history   Other Paternal Grandmother        unknown medical history   Other Paternal Grandfather        unknown medical history   Healthy Brother    Family Psychiatric  History: None reported Tobacco Screening:  Social History   Tobacco Use  Smoking Status Former   Types: Cigarettes   Quit date: 03/19/1981   Years since quitting: 42.0  Smokeless Tobacco Never    [facility] Tobacco Counseling     Are you interested in Tobacco Cessation Medications?  No value filed. Counseled patient on smoking cessation:  No value filed. Reason Tobacco Screening Not Completed: No value filed.       Social History:  Social History   Substance and Sexual Activity  Alcohol Use Not Currently   Alcohol/week: 24.0 standard drinks of alcohol   Types:  24 Shots of liquor per week   Comment: last used 09/2021     Social History   Substance and Sexual Activity  Drug Use Not Currently   Types: Marijuana   Comment: last use 09/2021    Additional Social History:                           Allergies:  No Known Allergies Lab Results:  Results for orders placed or performed during the hospital encounter of 04/06/23 (from the past 48 hour(s))  Comprehensive metabolic panel     Status: None   Collection Time: 04/06/23  9:09 PM  Result Value Ref Range   Sodium 145 135 - 145 mmol/L   Potassium 4.3 3.5 - 5.1 mmol/L   Chloride 111 98 - 111 mmol/L   CO2 23 22 -  32 mmol/L   Glucose, Bld 90 70 - 99 mg/dL    Comment: Glucose reference range applies only to samples taken after fasting for at least 8 hours.   BUN 20 8 - 23 mg/dL   Creatinine, Ser 1.61 0.61 - 1.24 mg/dL   Calcium 9.3 8.9 - 09.6 mg/dL   Total Protein 7.8 6.5 - 8.1 g/dL   Albumin 4.3 3.5 - 5.0 g/dL   AST 17 15 - 41 U/L   ALT 18 0 - 44 U/L   Alkaline Phosphatase 65 38 - 126 U/L   Total Bilirubin 0.9 0.3 - 1.2 mg/dL   GFR, Estimated >04 >54 mL/min    Comment: (NOTE) Calculated using the CKD-EPI Creatinine Equation (2021)    Anion gap 11 5 - 15    Comment: Performed at [facility], [address], [place], [place] [address]  Ethanol     Status: Abnormal   Collection Time: 04/06/23  9:09 PM  Result Value Ref Range   Alcohol, Ethyl (B) 280 (H) <10 mg/dL    Comment: (NOTE) Lowest detectable limit for serum alcohol is 10 mg/dL.  For medical purposes only. Performed at [facility], [address] [place], [place] [address]   Salicylate level     Status: Abnormal   Collection Time: 04/06/23  9:09 PM  Result Value Ref Range   Salicylate Lvl <7.0 (L) 7.0 - 30.0 mg/dL    Comment: Performed at [facility], [address] [place], [place] [address]  Acetaminophen level     Status: Abnormal   Collection Time: 04/06/23  9:09 PM   Result Value Ref Range   Acetaminophen (Tylenol), Serum <10 (L) 10 - 30 ug/mL    Comment: (NOTE) Therapeutic concentrations vary significantly. A range of 10-30 ug/mL  may be an effective concentration for many patients. However, some  are best treated at concentrations outside of this range. Acetaminophen concentrations >150 ug/mL at 4 hours after ingestion  and >50 ug/mL at 12 hours after ingestion are often associated with  toxic reactions.  Performed at [facility], [address] [place], [place] [address]   cbc     Status: Abnormal   Collection Time: 04/06/23  9:09 PM  Result Value Ref Range   WBC 9.2 4.0 - 10.5 K/uL   RBC 4.33 4.22 - 5.81 MIL/uL   Hemoglobin 12.7 (L) 13.0 - 17.0 g/dL   HCT 57.8 46.9 - 62.9 %   MCV 94.0 80.0 - 100.0 fL   MCH 29.3 26.0 - 34.0 pg   MCHC 31.2 30.0 - 36.0 g/dL   RDW 52.8 41.3 - 24.4 %   Platelets 355 150 - 400 K/uL   nRBC 0.0 0.0 - 0.2 %    Comment: Performed at [facility], [address] [place], [place] [address]  Urine Drug Screen, Qualitative     Status: Abnormal   Collection Time: 04/06/23  9:09 PM  Result Value Ref Range   Tricyclic, Ur Screen NONE DETECTED NONE DETECTED   Amphetamines, Ur Screen NONE DETECTED NONE DETECTED   MDMA (Ecstasy)Ur Screen NONE DETECTED NONE DETECTED   Cocaine Metabolite,Ur [place] NONE DETECTED NONE DETECTED   Opiate, Ur Screen NONE DETECTED NONE DETECTED   Phencyclidine (PCP) Ur S NONE DETECTED NONE DETECTED   Cannabinoid 50 Ng, Ur [place] POSITIVE (A) NONE DETECTED   Barbiturates, Ur Screen NONE DETECTED NONE DETECTED   Benzodiazepine, Ur Scrn NONE DETECTED NONE DETECTED   Methadone Scn, Ur NONE  DETECTED NONE DETECTED    Comment: (NOTE) Tricyclics + metabolites, urine    Cutoff 1000 ng/mL Amphetamines + metabolites, urine  Cutoff 1000 ng/mL MDMA (Ecstasy), urine              Cutoff 500 ng/mL Cocaine Metabolite, urine          Cutoff 300 ng/mL Opiate + metabolites, urine        Cutoff  300 ng/mL Phencyclidine (PCP), urine         Cutoff 25 ng/mL Cannabinoid, urine                 Cutoff 50 ng/mL Barbiturates + metabolites, urine  Cutoff 200 ng/mL Benzodiazepine, urine              Cutoff 200 ng/mL Methadone, urine                   Cutoff 300 ng/mL  The urine drug screen provides only a preliminary, unconfirmed analytical test result and should not be used for non-medical purposes. Clinical consideration and professional judgment should be applied to any positive drug screen result due to possible interfering substances. A more specific alternate chemical method must be used in order to obtain a confirmed analytical result. Gas chromatography / mass spectrometry (GC/MS) is the preferred confirm atory method. Performed at [facility], [address] [place], [place] [address]     Blood Alcohol level:  Lab Results  Component Value Date   ETH 280 (H) 04/06/2023   ETH <10 01/18/2023    Metabolic Disorder Labs:  Lab Results  Component Value Date   HGBA1C 5.2 11/29/2020   MPG 102.54 11/29/2020   MPG 105.41 03/18/2020   No results found for: "PROLACTIN" Lab Results  Component Value Date   CHOL 155 05/22/2020   TRIG 92 05/22/2020   HDL 65 05/22/2020   CHOLHDL 2.4 05/22/2020   VLDL 27 03/18/2020   LDLCALC 73 05/22/2020   LDLCALC 59 03/18/2020    Current Medications: Current Facility-Administered Medications  Medication Dose Route Frequency Provider Last Rate Last Admin   acetaminophen (TYLENOL) tablet 650 mg  650 mg Oral Q6H PRN Penn, Cranston Neighbor, NP       alum & mag hydroxide-simeth (MAALOX/MYLANTA) 200-200-20 MG/5ML suspension 30 mL  30 mL Oral Q4H PRN Penn, Cicely, NP       diphenhydrAMINE (BENADRYL) capsule 50 mg  50 mg Oral TID PRN Mcneil Sober, NP       Or   diphenhydrAMINE (BENADRYL) injection 50 mg  50 mg Intramuscular TID PRN Penn, Cicely, NP       haloperidol (HALDOL) tablet 5 mg  5 mg Oral TID PRN Mcneil Sober, NP       Or    haloperidol lactate (HALDOL) injection 5 mg  5 mg Intramuscular TID PRN Penn, Cranston Neighbor, NP       LORazepam (ATIVAN) tablet 2 mg  2 mg Oral TID PRN Mcneil Sober, NP       Or   LORazepam (ATIVAN) injection 2 mg  2 mg Intramuscular TID PRN Penn, Cranston Neighbor, NP       magnesium hydroxide (MILK OF MAGNESIA) suspension 30 mL  30 mL Oral Daily PRN Penn, Cranston Neighbor, NP       PTA Medications: Medications Prior to Admission  Medication Sig Dispense Refill Last Dose   albuterol (PROAIR HFA) 108 (90 Base) MCG/ACT inhaler Inhale 2 puffs into the lungs once every 4 (four) hours as needed. 17 g 1  allopurinol (ZYLOPRIM) 100 MG tablet Take 1 tablet (100 mg total) by mouth daily. 30 tablet 0    ascorbic acid (VITAMIN C) 500 MG tablet Take by mouth. (Patient not taking: Reported on 04/06/2023)      colchicine 0.6 MG tablet Take 1 tablet (0.6 mg total) by mouth daily for 3 days. 3 tablet 0    docusate sodium (COLACE) 100 MG capsule Take 1 capsule (100 mg total) by mouth 2 (two) times daily. 10 capsule 0    FLUoxetine (PROZAC) 20 MG capsule Take 20 mg by mouth every morning.      fluticasone furoate-vilanterol (BREO ELLIPTA) 200-25 MCG/ACT AEPB Inhale one puff by mouth daily 180 each 3    folic acid (FOLVITE) 1 MG tablet TAKE ONE TABLET BY MOUTH ONCE EVERY DAY. (Patient not taking: Reported on 12/22/2022) 30 tablet 1    hydrOXYzine (ATARAX) 25 MG tablet Take 1 tablet (25 mg total) by mouth 3 (three) times daily as needed for anxiety. 90 tablet 1    lisinopril (ZESTRIL) 40 MG tablet TAKE ONE TABLET BY MOUTH ONCE EVERY DAY. (Patient not taking: Reported on 04/06/2023) 90 tablet 0    metoprolol tartrate (LOPRESSOR) 25 MG tablet Take 1 tablet (25 mg total) by mouth once daily. 90 tablet 0    omeprazole (PRILOSEC) 20 MG capsule Take 20 mg by mouth daily. (Patient not taking: Reported on 04/06/2023)      polyethylene glycol powder (MIRALAX) 17 GM/SCOOP powder Take 17 g by mouth 2 (two) times daily as needed for moderate constipation.  238 g 0    senna-docusate (SENOKOT-S) 8.6-50 MG tablet Take 1 tablet by mouth at bedtime as needed for mild constipation.      sertraline (ZOLOFT) 25 MG tablet Take 1 tablet (25 mg total) by mouth at bedtime. (Patient not taking: Reported on 04/06/2023) 30 tablet 0    thiamine (VITAMIN B1) 100 MG tablet Take 1 tablet (100 mg total) by mouth daily. (Patient not taking: Reported on 04/06/2023) 90 tablet 0    traZODone (DESYREL) 100 MG tablet Take 50-100 mg by mouth at bedtime as needed.       Musculoskeletal: Strength & Muscle Tone: within normal limits Gait & Station: normal Patient leans: N/A            Psychiatric Specialty Exam:  Presentation  General Appearance:  Casual  Eye Contact: Good  Speech: Clear and Coherent  Speech Volume: Normal  Handedness:No data recorded  Mood and Affect  Mood: Anxious; Depressed  Affect: Congruent   Thought Process  Thought Processes: Coherent  Duration of Psychotic Symptoms:N/A Past Diagnosis of Schizophrenia or Psychoactive disorder: No data recorded Descriptions of Associations:Intact  Orientation:Full (Time, Place and Person)  Thought Content:WDL  Hallucinations:No data recorded Ideas of Reference:None  Suicidal Thoughts:No data recorded Homicidal Thoughts:No data recorded  Sensorium  Memory: Immediate Good  Judgment: Good  Insight: Good   Executive Functions  Concentration: Good  Attention Span: Good  Recall: Good  Fund of Knowledge: Good  Language: Good   Psychomotor Activity  Psychomotor Activity:No data recorded  Assets  Assets: Communication Skills; Desire for Improvement; Financial Resources/Insurance; Housing; Resilience   Sleep  Sleep:No data recorded   Physical Exam: Physical Exam Vitals and nursing note reviewed.  Constitutional:      Appearance: Normal appearance.  HENT:     Head: Normocephalic and atraumatic.     Mouth/Throat:     Pharynx: Oropharynx is  clear.  Eyes:     Pupils: Pupils  are equal, round, and reactive to light.  Cardiovascular:     Rate and Rhythm: Normal rate and regular rhythm.  Pulmonary:     Effort: Pulmonary effort is normal.     Breath sounds: Normal breath sounds.  Abdominal:     General: Abdomen is flat.     Palpations: Abdomen is soft.  Musculoskeletal:        General: Normal range of motion.  Skin:    General: Skin is warm and dry.     Comments: Superficial cut across the left wrist held together with Steri-Strips.  No sign of artery or nerve involvement  Neurological:     General: No focal deficit present.     Mental Status: He is alert. Mental status is at baseline.  Psychiatric:        Attention and Perception: Attention normal.        Mood and Affect: Mood is anxious.        Speech: Speech normal.        Behavior: Behavior is cooperative.        Thought Content: Thought content normal.        Cognition and Memory: Cognition normal.        Judgment: Judgment is impulsive.    Review of Systems  Constitutional: Negative.   HENT: Negative.    Eyes: Negative.   Respiratory: Negative.    Cardiovascular: Negative.   Gastrointestinal: Negative.   Musculoskeletal: Negative.   Skin: Negative.   Neurological: Negative.   Psychiatric/Behavioral:  Positive for substance abuse. Negative for depression, hallucinations and suicidal ideas. The patient is nervous/anxious.    Blood pressure 131/86, pulse 78, temperature 98.3 F (36.8 C), temperature source Oral, SpO2 98 %. There is no height or weight on file to calculate BMI.  Treatment Plan Summary: Plan patient is at his baseline and in this condition typically we have let him be discharged from the emergency room.  He is not suicidal and is not at elevated risk of complicated alcohol withdrawal.  He states he is going to [facility].  We talked for a while and I focused on my concern for him given that after an extended period of sobriety he seems to have relapsed  as soon as his wife was back home.  I tried to point out to him that this suggests that he is going to get back into his old pattern again if his wife stays at home with frequent drinking and frequent ER visits.  Patient insists that he does not think that will be the case.  He is advised to get all the alcohol out of the house and to continue going to [facility] and to really seriously consider getting involved in 12-step AA or other substance abuse treatment.  Otherwise discontinue IVC and he can be discharged home.  Observation Level/Precautions:  15 minute checks  Laboratory:  Chemistry Profile  Psychotherapy:    Medications:    Consultations:    Discharge Concerns:    Estimated LOS:  Other:     Physician Treatment Plan for Primary Diagnosis: Alcohol abuse with alcohol-induced mood disorder (HCC) Long Term Goal(s): Improvement in symptoms so as ready for discharge  Short Term Goals: Ability to demonstrate self-control will improve  Physician Treatment Plan for Secondary Diagnosis: Principal Problem:   Alcohol abuse with alcohol-induced mood disorder (HCC) Active Problems:   Self-inflicted laceration of left wrist (HCC)  Long Term Goal(s): Improvement in symptoms so as ready for discharge  Short Term Goals:  Ability to identify and develop effective coping behaviors will improve  I certify that inpatient services furnished can reasonably be expected to improve the patient's condition.    Mordecai Rasmussen, MD 5/22/20242:17 PM

## 2023-04-07 NOTE — ED Notes (Signed)
Patient given breakfast tray.

## 2023-04-07 NOTE — Group Note (Signed)
[facility] LCSW Group Therapy Note   Group Date: 04/07/2023 Start Time: 1300 End Time: 1400   Type of Therapy/Topic:  Group Therapy:  Emotion Regulation  Participation Level:  Did Not Attend   Mood:  Description of Group:    The purpose of this group is to assist patients in learning to regulate negative emotions and experience positive emotions. Patients will be guided to discuss ways in which they have been vulnerable to their negative emotions. These vulnerabilities will be juxtaposed with experiences of positive emotions or situations, and patients challenged to use positive emotions to combat negative ones. Special emphasis will be placed on coping with negative emotions in conflict situations, and patients will process healthy conflict resolution skills.  Therapeutic Goals: Patient will identify two positive emotions or experiences to reflect on in order to balance out negative emotions:  Patient will label two or more emotions that they find the most difficult to experience:  Patient will be able to demonstrate positive conflict resolution skills through discussion or role plays:   Summary of Patient Progress:  Patient did not attend group despite encouraged participation.    Therapeutic Modalities:   Cognitive Behavioral Therapy Feelings Identification Dialectical Behavioral Therapy   Raylon Lamson W Annaleah Arata, LCSWA 

## 2023-04-07 NOTE — [facility] Suicide Risk Assessment (Signed)
[facility] INPATIENT:  Family/Significant Other Suicide Prevention Education  Suicide Prevention Education:  Patient Refusal for Family/Significant Other Suicide Prevention Education: The patient Miguel [name] has refused to provide written consent for family/significant other to be provided Family/Significant Other Suicide Prevention Education during admission and/or prior to discharge.  Physician notified.  SPE completed with pt, as pt refused to consent to family contact. SPI pamphlet provided to pt and pt was encouraged to share information with support network, ask questions, and talk about any concerns relating to SPE. Pt denies access to guns/firearms and verbalized understanding of information provided. Mobile Crisis information also provided to pt.  Glenis Smoker 04/07/2023, 3:17 PM

## 2023-04-07 NOTE — BH Assessment (Signed)
Comprehensive Clinical Assessment (CCA) Screening, Triage and Referral Note  04/07/2023 Miguel [name] [id number] Recommendations for Services/Supports/Treatments: Consulted with Sindy Guadeloupe., NP, who determined pt. is psych cleared and can be discharged when medically cleared.  Miguel Floras. [name] is a 62 year old, English speaking, Caucasian male with a history OCD, MDD recurrent, severe, Substance induced mood d/o, alcohol abuse, and social anxiety disorder. Pt is under IVC. Per triage note: Pt presents ambulatory to triage under IVC with BPD for a suicide attempt. Per paperwork, the patient's wife stated she was going to leave him and the patient took a knife and slit his L wrist; bleeding well controlled with gauze and curlex - superficial lac. Pt states that this was a mistake and he was just upset with her wanting to leave.    On assessment the pt. admitted that he relapsed and took 2 shots of liquor earlier in the day. Pt reported that he'd been sober for 1 year. Pt reported that he'd gotten overwhelmed after his wife began yelling, cursing, and threatening to leave. Pt reported that he'd reacted impulsively, and understands that he shouldn't have cut or drank alcohol. Pt's UDS positive for cannabis; BAL is 280. Pt identified his main stressor as having the responsibility of taking care of his chronically ill wife as she'd recently returned home from assisted living. The pt. goes to [facility] for medication management. Pt was visibly anxious and perseverated about his wife's failing health. Pt was not responding to internal/external stimuli, nor did pt. present with delusional thoughts. Pt had coherent speech and was oriented x4. Pt presented with an anxious mood; affect was congruent. Pt had a disheveled appearance. Pt denied SI/HI/AV/H. Pt reported that he had no intent to kill himself upon cutting his wrist as the cuts were superficial.   Chief Complaint:  Chief Complaint  Patient presents with   Psychiatric  Evaluation   Visit Diagnosis: Moderate episode of recurrent major depressive disorder (HCC) Active Problems:   Hypertension   Substance induced mood disorder (HCC)   GERD (gastroesophageal reflux disease)   OCD (obsessive compulsive disorder)   AKI (acute kidney injury) (HCC)   MDD (major depressive disorder), recurrent severe, without psychosis (HCC)   Social anxiety disorder   Generalized anxiety disorder   Hyperkalemia   Frequency of urination and polyuria    Patient Reported Information How did you hear about [name]? Self  What Is the Reason for Your Visit/Call Today? Pt presents ambulatory to triage under IVC with BPD for a suicide attempt. Per paperwork, the patients wife stated she was going to leave him and the patient took a knife and slit his L wrist; bleeding well controlled with gauze and curlex - superficial lac. Pt states that this was a mistake and he was just upset with her wanting to leave.  How Long Has This Been Causing You Problems? <Week  What Do You Feel Would Help You the Most Today? Stress Management   Have You Recently Had Any Thoughts About Hurting Yourself? Yes  Are You Planning to Commit Suicide/Harm Yourself At This time? No   Have you Recently Had Thoughts About Hurting Someone Miguel [name]? No  Are You Planning to Harm Someone at This Time? No  Explanation: n/a   Have You Used Any Alcohol or Drugs in the Past 24 Hours? Yes  How Long Ago Did You Use Drugs or Alcohol? No data recorded What Did You Use and How Much? ETHOH; 2 shots   Do You Currently Have a Therapist/Psychiatrist?  Yes  Name of Therapist/Psychiatrist: [facility]   Have You Been Recently Discharged From Any Office Practice or Programs? No  Explanation of Discharge From Practice/Program: n/a    CCA Screening Triage Referral Assessment Type of Contact: Face-to-Face  Telemedicine Service Delivery:   Is this Initial or Reassessment?   Date Telepsych consult ordered in [facility]:    Time  Telepsych consult ordered in [facility]:    Location of Assessment: [facility] ED  Provider Location: [facility] ED    Collateral Involvement: None provided   Does Patient Have a Court Appointed Legal Guardian? No data recorded Name and Contact of Legal Guardian: No data recorded If Minor and Not Living with Parent(s), Who has Custody? n/a  Is CPS involved or ever been involved? Never  Is APS involved or ever been involved? Never   Patient Determined To Be At Risk for Harm To Self or Others Based on Review of Patient Reported Information or Presenting Complaint? No  Method: No Plan  Availability of Means: No access or NA  Intent: Vague intent or NA  Notification Required: No need or identified person  Additional Information for Danger to Others Potential: -- (n/a)  Additional Comments for Danger to Others Potential: n/a  Are There Guns or Other Weapons in Your Home? No  Types of Guns/Weapons: n/a  Are These Weapons Safely Secured?                            -- (n/a)  Who Could Verify You Are Able To Have These Secured: n/a  Do You Have any Outstanding Charges, Pending Court Dates, Parole/Probation? None reported  Contacted To Inform of Risk of Harm To Self or Others: Other: Comment (n/a)   Does Patient Present under Involuntary Commitment? Yes    [place] of Residence: [place]   Patient Currently Receiving the Following Services: Medication Management   Determination of Need: Urgent (48 hours)   Options For Referral: ED Visit   Discharge Disposition:     Noble Bodie R Kimia Finan, LCAS

## 2023-04-07 NOTE — BHH Counselor (Signed)
Adult Comprehensive Assessment  Patient ID: Miguel [name], male   DOB: [birth year], 62 y.o.   MRN: [id number]  Information Source: Information source: Patient  Current Stressors:  Patient states their primary concerns and needs for treatment are:: "An argument between me and my wife." Patient states their goals for this hospitilization and ongoing recovery are:: Pt expressed desire for discharge.  Living/Environment/Situation:     Family History:     Childhood History:     Education:     Employment/Work Situation:      Architect:      Alcohol/Substance Abuse:      Social Support System:      Leisure/Recreation:      Strengths/Needs:      Discharge Plan:      Summary/Recommendations:   Emergency planning/management officer and Recommendations (to be completed by the evaluator): Pt is a 62 year old, married, male [place] [facility] He shared that he came to the hospital because of an argument between him and his wife. Pt goal is to discharge back home to take care of his wife as she is bed bound. He presented to the ER with a superficial cut to his left wrist and had a BAC of 280. Pt shared that he had been sober for a year while his wife was in physical rehab but went to get alcohol when they began bickering upon her return. He shared that he has been attending services through [facility] for the past two months and has an appointment scheduled for 04/13/23. When asked if he would like CSW to call and move appointment up, he declined. CSW advised pt to follow through with his scheduled appointment. He has a primary diagnosis of alcohol abuse with alcohol induced mood disorder. Pt is recommended to follow through with his [facility] appointment, abstain from all drugs/alcohol, and take all medications as prescribed.  Glenis Smoker. 04/07/2023

## 2023-04-07 NOTE — Tx Team (Signed)
Initial Treatment Plan 04/07/2023 3:59 PM [name] WRU:[id number]    PATIENT STRESSORS: Marital or family conflict   Substance abuse     PATIENT STRENGTHS: Ability for insight  Capable of independent living  Communication skills  General fund of knowledge  Motivation for treatment/growth  Physical Health  Supportive family/friends    PATIENT IDENTIFIED PROBLEMS: Anxiety                     DISCHARGE CRITERIA:  Verbal commitment to aftercare and medication compliance  PRELIMINARY DISCHARGE PLAN: Outpatient therapy Return to previous living arrangement  PATIENT/FAMILY INVOLVEMENT: This treatment plan has been presented to and reviewed with the patient, Miguel [name].  The patient has been given the opportunity to ask questions and make suggestions.  Sofie Hartigan, RN 04/07/2023, 3:59 PM

## 2023-04-07 NOTE — ED Notes (Signed)
Patient went to restroom. Then asked for cup of water. Patient alert and oriented and pleasant. Patient asked when psych provider would be by to see him.  Writer advised patient it would be later in the AM.

## 2023-04-07 NOTE — ED Notes (Signed)
Ivc/Consulted with Sindy Guadeloupe., NP, who determined pt. is psych cleared and can be discharged when medically cleared.

## 2023-04-07 NOTE — Discharge Summary (Signed)
Physician Discharge Summary Note  Patient:  Miguel [name] is an 62 y.o., male MRN:  [id number] DOB:  1961-04-06 Patient phone:  [phone] (home)  Patient address:   [address] [name] [address],  Total Time spent with patient: 30 minutes  Date of Admission:  04/07/2023 Date of Discharge: 04/07/2023  Reason for Admission: Patient was admitted through the emergency room where he presented with a self-inflicted cut on his left wrist while intoxicated  Principal Problem: Alcohol abuse with alcohol-induced mood disorder [facility]) Discharge Diagnoses: Principal Problem:   Alcohol abuse with alcohol-induced mood disorder (HCC) Active Problems:   Self-inflicted laceration of left wrist [facility])   Past Psychiatric History: Long history of similar behavior.  Chronic alcohol abuse.  Longstanding anxiety.  Multiple episodes of self injury without suicidal intent.  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    OCD (obsessive compulsive disorder)    Renal colic     Past Surgical History:  Procedure Laterality Date   CHOLECYSTECTOMY  2012   COLONOSCOPY     COLONOSCOPY N/A 12/24/2022   Procedure: COLONOSCOPY;  Surgeon: Regis Bill, MD;  Location: [facility] ENDOSCOPY;  Service: Endoscopy;  Laterality: N/A;   COLONOSCOPY WITH PROPOFOL N/A 05/28/2020   Procedure: COLONOSCOPY WITH PROPOFOL;  Surgeon: Wyline Mood, MD;  Location: [facility] ENDOSCOPY;  Service: Gastroenterology;  Laterality: N/A;   ESOPHAGOGASTRODUODENOSCOPY N/A 12/24/2022   Procedure: ESOPHAGOGASTRODUODENOSCOPY (EGD);  Surgeon: Regis Bill, MD;  Location: [facility] ENDOSCOPY;  Service: Endoscopy;  Laterality: N/A;   EXTRACORPOREAL SHOCK WAVE LITHOTRIPSY  Left 01/12/2019   Procedure: EXTRACORPOREAL SHOCK WAVE LITHOTRIPSY (ESWL);  Surgeon: Sondra Come, MD;  Location: [facility] ORS;  Service: Urology;  Laterality: Left;   UPPER GI ENDOSCOPY     Family History:  Family History  Problem Relation Age of Onset   Alcohol abuse Father    Breast cancer Mother 60   Anxiety disorder Brother    Other Maternal Grandmother        unknown medical history   Other Maternal Grandfather        unknow medical history   Other Paternal Grandmother        unknown medical history   Other Paternal Grandfather        unknown medical history   Healthy Brother    Family Psychiatric  History: See previous Social History:  Social History   Substance and Sexual Activity  Alcohol Use Not Currently   Alcohol/week: 24.0 standard drinks of alcohol   Types: 24 Shots of liquor per week   Comment: last used 09/2021     Social History   Substance and Sexual Activity  Drug Use Not Currently   Types: Marijuana   Comment: last use 09/2021    Social History   Socioeconomic History   Marital status: Married    Spouse name: Not on file   Number of children: Not on file   Years of education: Not on file   Highest education level: Not on file  Occupational History   Not on file  Tobacco Use   Smoking status: Former  Types: Cigarettes    Quit date: 03/19/1981    Years since quitting: 42.0   Smokeless tobacco: Never  Vaping Use   Vaping Use: Never used  Substance and Sexual Activity   Alcohol use: Not Currently    Alcohol/week: 24.0 standard drinks of alcohol    Types: 24 Shots of liquor per week    Comment: last used 09/2021   Drug use: Not Currently    Types: Marijuana    Comment: last use 09/2021   Sexual activity: Not Currently  Other Topics Concern   Not on file  Social History Narrative   Lives at home with his wife and takes care of his wife. Independent at baseline      - Biggest strain is financial but doesn't think they could got get more  help.      Patient expressed interest in possible financial assistance. Informed written consent obtained   Social Determinants of Health   Financial Resource Strain: High Risk (09/18/2020)   Overall Financial Resource Strain (CARDIA)    Difficulty of Paying Living Expenses: Very hard  Food Insecurity: No Food Insecurity (01/22/2023)   Hunger Vital Sign    Worried About Running Out of Food in the Last Year: Never true    Ran Out of Food in the Last Year: Never true  Transportation Needs: No Transportation Needs (01/22/2023)   PRAPARE - Administrator, Civil Service (Medical): No    Lack of Transportation (Non-Medical): No  Physical Activity: Sufficiently Active (09/18/2020)   Exercise Vital Sign    Days of Exercise per Week: 7 days    Minutes of Exercise per Session: 60 min  Stress: Stress Concern Present (09/18/2020)   [employer] of Occupational Health - Occupational Stress Questionnaire    Feeling of Stress : Very much  Social Connections: Socially Isolated (09/18/2020)   Social Connection and Isolation Panel [NHANES]    Frequency of Communication with Friends and Family: Once a week    Frequency of Social Gatherings with Friends and Family: Once a week    Attends Religious Services: Never    Database administrator or Organizations: No    Attends Banker Meetings: Never    Marital Status: Married    Hospital Course: Patient was admitted to the psychiatric unit and immediately evaluated.  He is no longer intoxicated and is absolutely denying any suicidal ideation.  Patient is requesting discharge stating that he needs to get back home to help take care of his wife.  He has legitimate concerns about the condition of his wife given that she is unable to care for herself.  Patient acknowledges that drinking again was a big mistake.  I have counseled him quite a while about how this should be taken seriously as I indicated that he may be at risk of getting right  back into his behavior of drinking and hurting himself and coming to the emergency room.  Patient has been encouraged to follow-up with [facility] and not only that to get involved with AA and get involved with substance abuse treatment at [facility].  No change to medicine no new prescriptions.  Discontinue IVC.  Physical Findings: AIMS:  , ,  ,  ,    CIWA:    COWS:     Musculoskeletal: Strength & Muscle Tone: within normal limits Gait & Station: normal Patient leans: N/A   Psychiatric Specialty Exam:  Presentation  General Appearance:  Casual  Eye Contact: Good  Speech:  Clear and Coherent  Speech Volume: Normal  Handedness:No data recorded  Mood and Affect  Mood: Anxious; Depressed  Affect: Congruent   Thought Process  Thought Processes: Coherent  Descriptions of Associations:Intact  Orientation:Full (Time, Place and Person)  Thought Content:WDL  History of Schizophrenia/Schizoaffective disorder:No data recorded Duration of Psychotic Symptoms:No data recorded Hallucinations:No data recorded Ideas of Reference:None  Suicidal Thoughts:No data recorded Homicidal Thoughts:No data recorded  Sensorium  Memory: Immediate Good  Judgment: Good  Insight: Good   Executive Functions  Concentration: Good  Attention Span: Good  Recall: Good  Fund of Knowledge: Good  Language: Good   Psychomotor Activity  Psychomotor Activity:No data recorded  Assets  Assets: Communication Skills; Desire for Improvement; Financial Resources/Insurance; Housing; Resilience   Sleep  Sleep:No data recorded   Physical Exam: Physical Exam Vitals reviewed.  Constitutional:      Appearance: Normal appearance.  HENT:     Head: Normocephalic and atraumatic.     Mouth/Throat:     Pharynx: Oropharynx is clear.  Eyes:     Pupils: Pupils are equal, round, and reactive to light.  Cardiovascular:     Rate and Rhythm: Normal rate and regular rhythm.  Pulmonary:      Effort: Pulmonary effort is normal.     Breath sounds: Normal breath sounds.  Abdominal:     General: Abdomen is flat.     Palpations: Abdomen is soft.  Musculoskeletal:        General: Normal range of motion.  Skin:    General: Skin is warm and dry.     Comments: Superficial cut across the left wrist with Steri-Strips.  No sign of nerve artery involvement  Neurological:     General: No focal deficit present.     Mental Status: He is alert. Mental status is at baseline.  Psychiatric:        Attention and Perception: Attention normal.        Mood and Affect: Mood is anxious.        Speech: Speech normal.        Behavior: Behavior normal.        Thought Content: Thought content normal.        Cognition and Memory: Cognition normal.        Judgment: Judgment is impulsive.    Review of Systems  Constitutional: Negative.   HENT: Negative.    Eyes: Negative.   Respiratory: Negative.    Cardiovascular: Negative.   Gastrointestinal: Negative.   Musculoskeletal: Negative.   Skin: Negative.   Neurological: Negative.   Psychiatric/Behavioral:  Positive for substance abuse. Negative for depression, hallucinations and suicidal ideas. The patient is nervous/anxious.    Blood pressure 131/86, pulse 78, temperature 98.3 F (36.8 C), temperature source Oral, SpO2 98 %. There is no height or weight on file to calculate BMI.   Social History   Tobacco Use  Smoking Status Former   Types: Cigarettes   Quit date: 03/19/1981   Years since quitting: 42.0  Smokeless Tobacco Never   Tobacco Cessation:  N/A, patient does not currently use tobacco products   Blood Alcohol level:  Lab Results  Component Value Date   ETH 280 (H) 04/06/2023   ETH <10 01/18/2023    Metabolic Disorder Labs:  Lab Results  Component Value Date   HGBA1C 5.2 11/29/2020   MPG 102.54 11/29/2020   MPG 105.41 03/18/2020   No results found for: "PROLACTIN" Lab Results  Component Value Date   CHOL 155  05/22/2020   TRIG 92 05/22/2020   HDL 65 05/22/2020   CHOLHDL 2.4 05/22/2020   VLDL 27 03/18/2020   LDLCALC 73 05/22/2020   LDLCALC 59 03/18/2020    See Psychiatric Specialty Exam and Suicide Risk Assessment completed by Attending Physician prior to discharge.  Discharge destination:  Home  Is patient on multiple antipsychotic therapies at discharge:  No   Has Patient had three or more failed trials of antipsychotic monotherapy by history:  No  Recommended Plan for Multiple Antipsychotic Therapies: NA  Discharge Instructions     Diet - low sodium heart healthy   Complete by: As directed    Increase activity slowly   Complete by: As directed       Allergies as of 04/07/2023   No Known Allergies      Medication List     STOP taking these medications    ascorbic acid 500 MG tablet Commonly known as: VITAMIN C   FLUoxetine 20 MG capsule Commonly known as: PROZAC   folic acid 1 MG tablet Commonly known as: FOLVITE   omeprazole 20 MG capsule Commonly known as: PRILOSEC   thiamine 100 MG tablet Commonly known as: VITAMIN B1       TAKE these medications      Indication  allopurinol 100 MG tablet Commonly known as: Zyloprim Take 1 tablet (100 mg total) by mouth daily.    Breo Ellipta 200-25 MCG/ACT Aepb Generic drug: fluticasone furoate-vilanterol Inhale one puff by mouth daily  Indication: Asthma   colchicine 0.6 MG tablet Take 1 tablet (0.6 mg total) by mouth daily for 3 days.    docusate sodium 100 MG capsule Commonly known as: COLACE Take 1 capsule (100 mg total) by mouth 2 (two) times daily.  Indication: Constipation   hydrOXYzine 25 MG tablet Commonly known as: ATARAX Take 1 tablet (25 mg total) by mouth 3 (three) times daily as needed for anxiety.  Indication: Feeling Anxious   lisinopril 40 MG tablet Commonly known as: ZESTRIL TAKE ONE TABLET BY MOUTH ONCE EVERY DAY.  Indication: High Blood Pressure Disorder   metoprolol tartrate 25  MG tablet Commonly known as: LOPRESSOR Take 1 tablet (25 mg total) by mouth once daily.  Indication: High Blood Pressure Disorder   polyethylene glycol powder 17 GM/SCOOP powder Commonly known as: MiraLax Take 17 g by mouth 2 (two) times daily as needed for moderate constipation.  Indication: Chronic Constipation of Unknown Cause   ProAir HFA 108 (90 Base) MCG/ACT inhaler Generic drug: albuterol Inhale 2 puffs into the lungs once every 4 (four) hours as needed.  Indication: Asthma   senna-docusate 8.6-50 MG tablet Commonly known as: Senokot-S Take 1 tablet by mouth at bedtime as needed for mild constipation.  Indication: Constipation   sertraline 25 MG tablet Commonly known as: ZOLOFT Take 1 tablet (25 mg total) by mouth at bedtime.  Indication: Generalized Anxiety Disorder   traZODone 100 MG tablet Commonly known as: DESYREL Take 50-100 mg by mouth at bedtime as needed.  Indication: Trouble Sleeping         Follow-up recommendations:  Other:  Continue current medicines.  Do not drink alcohol.  Follow-up at [facility].  Get involved in substance abuse treatment  Comments: No prescriptions required  Signed: Mordecai Rasmussen, MD 04/07/2023, 2:26 PM

## 2023-04-07 NOTE — Progress Notes (Signed)
Discharge note: Suicide safety plan and survey complete. RN met with pt and reviewed pt's discharge instructions. Pt verbalized understanding of discharge instructions and pt did not have any questions. RN reviewed and provided pt with a copy of SRA, AVS and Transition Record. RN returned pt's belongings to pt. Pt denied SI/HI/AVH and voiced no concerns. Pt was appreciative of the care pt received at [facility]. Patient discharged to the lobby without incident.

## 2023-04-07 NOTE — Consult Note (Signed)
[facility] Face-to-Face Psychiatry Consult   Reason for Consult:  Suicidal ideation Referring Physician:  Dionne Bucy MD Patient Identification: Miguel [name] MRN:  [id number] Principal Diagnosis: <principal problem not specified> Diagnosis:  Active Problems:   Suicidal ideation   Total Time spent with patient: 15 minutes  Subjective:   Miguel [name] is a 62 y.o. male patient admitted with suicidal ideation and self inflicted laceration to left wrist. Patient states "it was silly that I did that. I got to not do that anymore".  HPI:  Miguel [name] is a 62 yo male presenting to ED under IVC due to suicidal ideations and self inflicted laceration to left wrist. He has a psychiatric hx of suicidal ideation, alcohol use disorder, MDD, OCD, social anxiety disorder, GAD. He reports being established with Dr. Ernestina Columbia at [facility] for outpatient medication management for insomnia. Patient denies a hx of psychiatric hospitalizations or SI, although patient's records indicate otherwise. He denies a hx of self harm but has several healed laceration scars to his left forearm. Patient stating that he no longer wants to end his life because "it was all a misunderstanding". Patient states that he spoke with his wife, who is currently bedridden due to a recent fracture, and his wife confirmed that she didn't mean that she wanted to leave him and end their marriage of 41 years. Patient reports that he cut his wrist due to thinking that his wife wanted to "leave" him. Patient reports that he drank 3 shots of Vodka prior to admitting, stating that he was sober for at least a year prior to his relapse yesterday. Patient also attributed SI to a recent antibiotic medication. Patient inquired about the duration of his stay, stating he must get home to care for his wife. Patient acknowledged understanding regarding IVC orders and the need for safety evaluation/treatment. Patient appeared dysphoric due to thoughts of being away from his  wife. He is noted lying in bed with disheveled appearance.  This Clinical research associate spoke with [facility] and patient has not been active with peer support participation and his last appt. With Dr. Georjean Mode was on 03/11/2023. Per [facility] representative he was also referred to [facility] by Laurena Bering for additional services. [facility] representative informed that he was instructed to RTC in one month but no f/u appt is scheduled at this time. This writer scheduled patient's next appointment to ensure continuity of care upon his hospital discharge. His next scheduled appointment with Dr. Georjean Mode is on 05/10/2023 at 10:40 AM.   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    OCD (obsessive compulsive disorder)    Renal colic     Past Surgical History:  Procedure Laterality Date   CHOLECYSTECTOMY  2012   COLONOSCOPY     COLONOSCOPY N/A 12/24/2022   Procedure: COLONOSCOPY;  Surgeon: Regis Bill, MD;  Location: [facility] ENDOSCOPY;  Service: Endoscopy;  Laterality: N/A;   COLONOSCOPY WITH PROPOFOL N/A 05/28/2020   Procedure: COLONOSCOPY  WITH PROPOFOL;  Surgeon: Wyline Mood, MD;  Location: [facility] ENDOSCOPY;  Service: Gastroenterology;  Laterality: N/A;   ESOPHAGOGASTRODUODENOSCOPY N/A 12/24/2022   Procedure: ESOPHAGOGASTRODUODENOSCOPY (EGD);  Surgeon: Regis Bill, MD;  Location: [facility] ENDOSCOPY;  Service: Endoscopy;  Laterality: N/A;   EXTRACORPOREAL SHOCK WAVE LITHOTRIPSY Left 01/12/2019   Procedure: EXTRACORPOREAL SHOCK WAVE LITHOTRIPSY (ESWL);  Surgeon: Sondra Come, MD;  Location: [facility] ORS;  Service: Urology;  Laterality: Left;    UPPER GI ENDOSCOPY     Family History:  Family History  Problem Relation Age of Onset   Alcohol abuse Father    Breast cancer Mother 60   Anxiety disorder Brother    Other Maternal Grandmother        unknown medical history   Other Maternal Grandfather        unknow medical history   Other Paternal Grandmother        unknown medical history   Other Paternal Grandfather        unknown medical history   Healthy Brother    Family Psychiatric  History: see above Social History:  Social History   Substance and Sexual Activity  Alcohol Use Not Currently   Alcohol/week: 24.0 standard drinks of alcohol   Types: 24 Shots of liquor per week   Comment: last used 09/2021     Social History   Substance and Sexual Activity  Drug Use Not Currently   Types: Marijuana   Comment: last use 09/2021    Social History   Socioeconomic History   Marital status: Married    Spouse name: Not on file   Number of children: Not on file   Years of education: Not on file   Highest education level: Not on file  Occupational History   Not on file  Tobacco Use   Smoking status: Former    Types: Cigarettes    Quit date: 03/19/1981    Years since quitting: 42.0   Smokeless tobacco: Never  Vaping Use   Vaping Use: Never used  Substance and Sexual Activity   Alcohol use: Not Currently    Alcohol/week: 24.0 standard drinks of alcohol    Types: 24 Shots of liquor per week    Comment: last used 09/2021   Drug use: Not Currently    Types: Marijuana    Comment: last use 09/2021   Sexual activity: Not Currently  Other Topics Concern   Not on file  Social History Narrative   Lives at home with his wife and takes care of his wife. Independent at baseline      - Biggest strain is financial but doesn't think they could got get more help.      Patient expressed interest in possible financial assistance. Informed written consent obtained   Social Determinants of Health   Financial Resource  Strain: High Risk (09/18/2020)   Overall Financial Resource Strain (CARDIA)    Difficulty of Paying Living Expenses: Very hard  Food Insecurity: No Food Insecurity (01/22/2023)   Hunger Vital Sign    Worried About Running Out of Food in the Last Year: Never true    Ran Out of Food in the Last Year: Never true  Transportation Needs: No Transportation Needs (01/22/2023)   PRAPARE - Administrator, Civil Service (Medical): No    Lack of Transportation (Non-Medical): No  Physical Activity: Sufficiently Active (09/18/2020)   Exercise Vital Sign    Days of Exercise per Week: 7 days    Minutes of Exercise  per Session: 60 min  Stress: Stress Concern Present (09/18/2020)   [employer] of Occupational Health - Occupational Stress Questionnaire    Feeling of Stress : Very much  Social Connections: Socially Isolated (09/18/2020)   Social Connection and Isolation Panel [NHANES]    Frequency of Communication with Friends and Family: Once a week    Frequency of Social Gatherings with Friends and Family: Once a week    Attends Religious Services: Never    Database administrator or Organizations: No    Attends Banker Meetings: Never    Marital Status: Married   Additional Social History:    Allergies:  No Known Allergies  Labs:  Results for orders placed or performed during the hospital encounter of 04/06/23 (from the past 48 hour(s))  Comprehensive metabolic panel     Status: None   Collection Time: 04/06/23  9:09 PM  Result Value Ref Range   Sodium 145 135 - 145 mmol/L   Potassium 4.3 3.5 - 5.1 mmol/L   Chloride 111 98 - 111 mmol/L   CO2 23 22 - 32 mmol/L   Glucose, Bld 90 70 - 99 mg/dL    Comment: Glucose reference range applies only to samples taken after fasting for at least 8 hours.   BUN 20 8 - 23 mg/dL   Creatinine, Ser 1.61 0.61 - 1.24 mg/dL   Calcium 9.3 8.9 - 09.6 mg/dL   Total Protein 7.8 6.5 - 8.1 g/dL   Albumin 4.3 3.5 - 5.0 g/dL   AST 17 15 - 41  U/L   ALT 18 0 - 44 U/L   Alkaline Phosphatase 65 38 - 126 U/L   Total Bilirubin 0.9 0.3 - 1.2 mg/dL   GFR, Estimated >04 >54 mL/min    Comment: (NOTE) Calculated using the CKD-EPI Creatinine Equation (2021)    Anion gap 11 5 - 15    Comment: Performed at [facility], [address], [place], [place] [address]  Ethanol     Status: Abnormal   Collection Time: 04/06/23  9:09 PM  Result Value Ref Range   Alcohol, Ethyl (B) 280 (H) <10 mg/dL    Comment: (NOTE) Lowest detectable limit for serum alcohol is 10 mg/dL.  For medical purposes only. Performed at [facility], [address] [place], [place] [address]   Salicylate level     Status: Abnormal   Collection Time: 04/06/23  9:09 PM  Result Value Ref Range   Salicylate Lvl <7.0 (L) 7.0 - 30.0 mg/dL    Comment: Performed at  [facility], [address] [place], [place] [address]  Acetaminophen level     Status: Abnormal   Collection Time: 04/06/23  9:09 PM  Result Value Ref Range   Acetaminophen (Tylenol), Serum <10 (L) 10 - 30 ug/mL    Comment: (NOTE) Therapeutic concentrations vary significantly. A range of 10-30 ug/mL  may be an effective concentration for many patients. However, some  are best treated at concentrations outside of this range. Acetaminophen concentrations >150 ug/mL at 4 hours after ingestion  and >50 ug/mL at 12 hours after ingestion are often associated with  toxic reactions.  Performed at [facility], [address] [place], [place] [address]   cbc     Status: Abnormal   Collection Time: 04/06/23  9:09 PM  Result Value Ref Range   WBC 9.2 4.0 - 10.5 K/uL   RBC 4.33 4.22 - 5.81 MIL/uL   Hemoglobin 12.7 (L) 13.0 -  17.0 g/dL   HCT 16.1 09.6 - 04.5 %   MCV 94.0 80.0 - 100.0 fL   MCH 29.3 26.0 - 34.0 pg   MCHC 31.2 30.0 - 36.0 g/dL   RDW 40.9 81.1 - 91.4 %   Platelets 355 150 - 400 K/uL   nRBC 0.0 0.0 - 0.2 %    Comment: Performed at [facility], [address], [place], [place] [address]  Urine Drug Screen, Qualitative     Status: Abnormal   Collection Time: 04/06/23  9:09 PM  Result Value Ref Range   Tricyclic, Ur Screen NONE DETECTED NONE DETECTED   Amphetamines, Ur Screen NONE DETECTED NONE DETECTED   MDMA (Ecstasy)Ur Screen NONE DETECTED NONE DETECTED   Cocaine Metabolite,Ur [place] NONE DETECTED NONE DETECTED   Opiate, Ur Screen NONE DETECTED NONE DETECTED   Phencyclidine (PCP) Ur S NONE DETECTED NONE DETECTED   Cannabinoid 50 Ng, Ur [place] POSITIVE (A) NONE DETECTED   Barbiturates, Ur Screen NONE DETECTED NONE DETECTED   Benzodiazepine, Ur Scrn NONE DETECTED NONE DETECTED   Methadone Scn, Ur NONE DETECTED NONE DETECTED    Comment: (NOTE) Tricyclics + metabolites, urine    Cutoff 1000 ng/mL Amphetamines + metabolites, urine  Cutoff 1000 ng/mL MDMA (Ecstasy), urine              Cutoff 500 ng/mL Cocaine Metabolite, urine          Cutoff 300 ng/mL Opiate + metabolites, urine        Cutoff 300 ng/mL Phencyclidine (PCP), urine         Cutoff 25 ng/mL Cannabinoid, urine                 Cutoff 50 ng/mL Barbiturates + metabolites, urine  Cutoff 200 ng/mL Benzodiazepine, urine              Cutoff 200 ng/mL Methadone, urine                   Cutoff 300 ng/mL  The urine drug screen provides only a preliminary, unconfirmed analytical test result and should not be used for non-medical purposes. Clinical consideration and professional judgment should be applied to any positive drug screen result due to possible interfering substances. A more specific alternate chemical method must be used in order to obtain a confirmed analytical result. Gas chromatography / mass spectrometry (GC/MS) is the preferred confirm atory method. Performed at [facility], [address] [place], [place] [address]     No current facility-administered medications for this encounter.   No current outpatient medications on file.    Facility-Administered Medications Ordered in Other Encounters  Medication Dose Route Frequency Provider Last Rate Last Admin   acetaminophen (TYLENOL) tablet 650 mg  650 mg Oral Q6H PRN Kellina Dreese, NP       alum & mag hydroxide-simeth (MAALOX/MYLANTA) 200-200-20 MG/5ML suspension 30 mL  30 mL Oral Q4H PRN Krisna Omar, NP       diphenhydrAMINE (BENADRYL) capsule 50 mg  50 mg Oral TID PRN Mcneil Sober, NP       Or   diphenhydrAMINE (BENADRYL) injection 50 mg  50 mg Intramuscular TID PRN Sabien Umland, NP       haloperidol (HALDOL) tablet 5 mg  5 mg Oral TID PRN Agustine Rossitto, [place] Neighbor, NP       Or   haloperidol lactate (HALDOL) injection 5 mg  5 mg Intramuscular TID PRN Mcneil Sober, NP  LORazepam (ATIVAN) tablet 2 mg  2 mg Oral TID PRN Mcneil Sober, NP       Or   LORazepam (ATIVAN) injection 2 mg  2 mg Intramuscular TID PRN Mitchelle Goerner, NP       magnesium hydroxide (MILK OF MAGNESIA) suspension 30 mL  30 mL Oral Daily PRN Mcneil Sober, NP        Musculoskeletal: Strength & Muscle Tone: within normal limits Gait & Station: normal Patient leans: N/A            Psychiatric Specialty Exam:  Presentation  General Appearance:  Disheveled  Eye Contact: Good  Speech: Clear and Coherent  Speech Volume: Normal  Handedness:No data recorded  Mood and Affect  Mood: Dysphoric  Affect: Congruent   Thought Process  Thought Processes: Coherent; Linear  Descriptions of Associations:Intact  Orientation:Full (Time, Place and Person)  Thought Content:WDL  History of Schizophrenia/Schizoaffective disorder:No data recorded Duration of Psychotic Symptoms:No data recorded Hallucinations:Hallucinations: None  Ideas of Reference:None  Suicidal Thoughts:Suicidal Thoughts: Yes, Active (upon admit) SI Active Intent and/or Plan: With Intent (pt noted with self inflicted laceration to left wrist)  Homicidal Thoughts:Homicidal Thoughts: No   Sensorium  Memory: Immediate  Good; Remote Good; Recent Good  Judgment: Poor  Insight: Fair   Art therapist  Concentration: Good  Attention Span: Good  Recall: Good  Fund of Knowledge: Good  Language: Good   Psychomotor Activity  Psychomotor Activity:Psychomotor Activity: Normal   Assets  Assets: Communication Skills; Financial Resources/Insurance; Housing; Social Support   Sleep  Sleep:Sleep: Good   Physical Exam: Physical Exam Vitals and nursing note reviewed.  Neurological:     General: No focal deficit present.     Mental Status: He is alert.  Psychiatric:        Mood and Affect: Mood normal.        Behavior: Behavior normal.    Review of Systems  Psychiatric/Behavioral:  Positive for suicidal ideas. Negative for depression, hallucinations and memory loss. The patient is not nervous/anxious.   All other systems reviewed and are negative.  Blood pressure 128/79, pulse 98, temperature 98.1 F (36.7 C), temperature source Oral, resp. rate 16, height 6' (1.829 m), weight 83.9 kg, SpO2 97 %. Body mass index is 25.09 kg/m.  Treatment Plan Summary: Patient IVC'd due to SI and self inflicted laceration to left wrist.  Daily contact with patient to assess and evaluate symptoms and progress in treatment  Disposition: Recommend psychiatric Inpatient admission when medically cleared.  Mcneil Sober, NP 04/07/2023 3:13 PM

## 2023-04-07 NOTE — Progress Notes (Signed)
  [facility] Adult Case Management Discharge Plan :  Will you be returning to the same living situation after discharge:  Yes,  pt plans to return home upon discharge. At discharge, do you have transportation home?: Yes,  CSW arranged transportation home. Do you have the ability to pay for your medications: Yes,  [employer].   Release of information consent forms completed and in the chart;  Patient's signature needed at discharge.  Patient to Follow up at:  Follow-up Information     [facility] Follow up.   Why: Follow through with you scheduled appointment on 04/13/23. Thanks! Contact information: [address] [place] [address] [phone]                 Next level of care provider has access to [facility] Link:no  Safety Planning and Suicide Prevention discussed: Yes,  SPE completed with pt.      Has patient been referred to the Quitline?: Patient refused referral for treatment  Patient has been referred for addiction treatment: Patient refused referral for treatment.  Glenis Smoker, LCSW 04/07/2023, 3:58 PM

## 2023-04-07 NOTE — BH Assessment (Signed)
Patient is to be admitted to [facility] by Dr. Toni Amend today 04/07/23.  Attending Physician will be Dr.  Toni Amend .   Patient has been assigned to room 312, by [facility] Charge Nurse Selena Batten.     ER staff is aware of the admission: Fleet Contras, ER Secretary   Dr. Modesto Charon, ER MD  Pattricia Boss, Patient's Nurse  Juliette Alcide, Patient Access.

## 2023-04-16 ENCOUNTER — Telehealth: Payer: Self-pay | Admitting: Family Medicine

## 2023-04-16 ENCOUNTER — Encounter: Payer: Self-pay | Admitting: Family Medicine

## 2023-04-16 ENCOUNTER — Ambulatory Visit: Payer: Medicaid Other | Admitting: Family Medicine

## 2023-04-16 VITALS — BP 110/80 | HR 75 | Ht 72.0 in | Wt 188.0 lb

## 2023-04-16 DIAGNOSIS — F19939 Other psychoactive substance use, unspecified with withdrawal, unspecified: Secondary | ICD-10-CM | POA: Diagnosis not present

## 2023-04-16 DIAGNOSIS — J432 Centrilobular emphysema: Secondary | ICD-10-CM | POA: Insufficient documentation

## 2023-04-16 DIAGNOSIS — K7030 Alcoholic cirrhosis of liver without ascites: Secondary | ICD-10-CM

## 2023-04-16 DIAGNOSIS — R739 Hyperglycemia, unspecified: Secondary | ICD-10-CM

## 2023-04-16 DIAGNOSIS — Z114 Encounter for screening for human immunodeficiency virus [HIV]: Secondary | ICD-10-CM

## 2023-04-16 DIAGNOSIS — I10 Essential (primary) hypertension: Secondary | ICD-10-CM

## 2023-04-16 DIAGNOSIS — I158 Other secondary hypertension: Secondary | ICD-10-CM

## 2023-04-16 DIAGNOSIS — E0590 Thyrotoxicosis, unspecified without thyrotoxic crisis or storm: Secondary | ICD-10-CM

## 2023-04-16 DIAGNOSIS — Z125 Encounter for screening for malignant neoplasm of prostate: Secondary | ICD-10-CM

## 2023-04-16 DIAGNOSIS — F332 Major depressive disorder, recurrent severe without psychotic features: Secondary | ICD-10-CM

## 2023-04-16 DIAGNOSIS — R0602 Shortness of breath: Secondary | ICD-10-CM

## 2023-04-16 DIAGNOSIS — Z1159 Encounter for screening for other viral diseases: Secondary | ICD-10-CM | POA: Insufficient documentation

## 2023-04-16 DIAGNOSIS — Z7689 Persons encountering health services in other specified circumstances: Secondary | ICD-10-CM | POA: Insufficient documentation

## 2023-04-16 DIAGNOSIS — M1A29X1 Drug-induced chronic gout, multiple sites, with tophus (tophi): Secondary | ICD-10-CM

## 2023-04-16 DIAGNOSIS — E782 Mixed hyperlipidemia: Secondary | ICD-10-CM

## 2023-04-16 DIAGNOSIS — F411 Generalized anxiety disorder: Secondary | ICD-10-CM

## 2023-04-16 MED ORDER — METOPROLOL SUCCINATE ER 25 MG PO TB24
25.0000 mg | ORAL_TABLET | Freq: Every day | ORAL | 3 refills | Status: AC
Start: 2023-04-16 — End: ?

## 2023-04-16 MED ORDER — LISINOPRIL 40 MG PO TABS
ORAL_TABLET | Freq: Every day | ORAL | 3 refills | Status: AC
Start: 2023-04-16 — End: ?

## 2023-04-16 MED ORDER — FLUTICASONE FUROATE-VILANTEROL 200-25 MCG/ACT IN AEPB: INHALATION_SPRAY | RESPIRATORY_TRACT | 3 refills | Status: DC

## 2023-04-16 MED ORDER — ALBUTEROL SULFATE HFA 108 (90 BASE) MCG/ACT IN AERS
1.0000 | INHALATION_SPRAY | RESPIRATORY_TRACT | 11 refills | Status: DC | PRN
Start: 2023-04-16 — End: 2023-12-23

## 2023-04-16 MED ORDER — SERTRALINE HCL 50 MG PO TABS
50.0000 mg | ORAL_TABLET | Freq: Every day | ORAL | 3 refills | Status: AC
Start: 2023-04-16 — End: ?

## 2023-04-16 MED ORDER — AZITHROMYCIN 500 MG PO TABS
500.0000 mg | ORAL_TABLET | Freq: Every day | ORAL | 0 refills | Status: DC
Start: 2023-04-16 — End: 2023-04-29

## 2023-04-16 MED ORDER — DOXYCYCLINE HYCLATE 100 MG PO TABS
100.0000 mg | ORAL_TABLET | Freq: Two times a day (BID) | ORAL | 0 refills | Status: DC
Start: 2023-04-16 — End: 2023-04-29

## 2023-04-16 MED ORDER — PREDNISONE 50 MG PO TABS
50.0000 mg | ORAL_TABLET | Freq: Every day | ORAL | 0 refills | Status: DC
Start: 2023-04-16 — End: 2023-04-29

## 2023-04-16 MED ORDER — ALLOPURINOL 100 MG PO TABS
100.0000 mg | ORAL_TABLET | Freq: Every day | ORAL | 3 refills | Status: DC
Start: 2023-04-16 — End: 2024-05-10

## 2023-04-16 NOTE — Assessment & Plan Note (Signed)
Chronic, at goal Repeat labs Continue meds -lisinopril 40 mg  -metop xl 25 mg

## 2023-04-16 NOTE — Assessment & Plan Note (Signed)
Has not been seen since he got medicaid [facility] Things to do to keep yourself healthy  - Exercise at least 30-45 minutes a day, 3-4 days a week.  - Eat a low-fat diet with lots of fruits and vegetables, up to 7-9 servings per day.  - Seatbelts can save your life. Wear them always.  - Smoke detectors on every level of your home, check batteries every year.  - Eye Doctor - have an eye exam every 1-2 years  - Safe sex - if you may be exposed to STDs, use a condom.  - Alcohol -  If you drink, do it moderately, less than 2 drinks per day.  - Health Care Power of Attorney. Choose someone to speak for you if you are not able.  - Depression is common in our stressful world.If you're feeling down or losing interest in things you normally enjoy, please come in for a visit.  - Violence - If anyone is threatening or hurting you, please call immediately.

## 2023-04-16 NOTE — Assessment & Plan Note (Signed)
Low risk screen Treatable, and curable. If left untreated Hep C can lead to cirrhosis and liver failure. Encourage routine testing; recommend repeat testing if risk factors change. Recommend further hep screening iso etoh abuse/misuse

## 2023-04-16 NOTE — Assessment & Plan Note (Signed)
Chronic, unknown The 10-year ASCVD risk score (Arnett DK, et al., 2019) is: 6.8% Repeat LP recommend diet low in saturated fat and regular exercise - 30 min at least 5 times per week Not on statin at this time

## 2023-04-16 NOTE — Assessment & Plan Note (Signed)
Chronic, unknown Repeat labs Endorses tremor Continue to monitor s/s No palpitations; or difficulty sleeping

## 2023-04-16 NOTE — Telephone Encounter (Signed)
Covermymeds is requesting prior authorization Key: [id number] Name: [place] Fluticasone Furoate- Vilanterol 200-26mcg/act aerosol powder has been rejected and requires prior authorization

## 2023-04-16 NOTE — Assessment & Plan Note (Signed)
Chronic, variable Endorses stressors of bringing wife home from rehab without much skill set/help at home Previously on zoloft- will titrate up Continue trazodone to assist

## 2023-04-16 NOTE — Assessment & Plan Note (Signed)
Acute on chronic with recent flare and wheezing/doe Recommend abx and steroids to assit Continue breo and prn albuterol  Can refer to lung cancer screening at later date and f/u with pulm if warranted

## 2023-04-16 NOTE — Assessment & Plan Note (Signed)
Chronic, since ETOH wean 1 year ago Referral to neuro to assist with next steps

## 2023-04-16 NOTE — Assessment & Plan Note (Signed)
Chronic, unknown Repeat labs No active signs of scleral icterus or jaundice Rounded, distended belly Nervousness/anxiety with tremor Reports recent "slip" which resulted in hospital stay and self harm attempt No current c/f SI/HI

## 2023-04-16 NOTE — Assessment & Plan Note (Signed)
Chronic, well controlled Repeat uric acid levels Previously on allopurinol and prn colchicine; defer refill of NSAID at this time given no active c/o flare

## 2023-04-16 NOTE — Progress Notes (Signed)
New patient visit   Patient: Miguel [name]   DOB: [birth year]   62 y.o. Male  MRN: [id number] Visit Date: 04/16/2023  Today's healthcare provider: Jacky Kindle, FNP   Chief Complaint  Patient presents with   New Patient (Initial Visit)    Establish    Subjective    Miguel [name] is a 62 y.o. male who presents today as a new patient to establish care.  HPI HPI     New Patient (Initial Visit)    Additional comments: Establish         Comments   Establish  Pt stated--both hand shaking--1 year      Last edited by Shelly Bombard, CMA on 04/16/2023  2:07 PM.      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    OCD (obsessive compulsive disorder)    Renal colic    Past Surgical History:  Procedure Laterality Date   CHOLECYSTECTOMY  2012   COLONOSCOPY     COLONOSCOPY N/A 12/24/2022   Procedure: COLONOSCOPY;  Surgeon: Regis Bill, MD;  Location: [facility] ENDOSCOPY;  Service: Endoscopy;  Laterality: N/A;   COLONOSCOPY WITH PROPOFOL N/A 05/28/2020   Procedure: COLONOSCOPY WITH PROPOFOL;  Surgeon: Wyline Mood, MD;  Location: [facility] ENDOSCOPY;  Service: Gastroenterology;  Laterality: N/A;   ESOPHAGOGASTRODUODENOSCOPY N/A 12/24/2022   Procedure: ESOPHAGOGASTRODUODENOSCOPY (EGD);  Surgeon: Regis Bill, MD;  Location: [facility] ENDOSCOPY;  Service: Endoscopy;  Laterality: N/A;   EXTRACORPOREAL SHOCK WAVE LITHOTRIPSY Left 01/12/2019   Procedure: EXTRACORPOREAL SHOCK WAVE LITHOTRIPSY (ESWL);  Surgeon: Sondra Come, MD;  Location: [facility] ORS;  Service: Urology;  Laterality: Left;   UPPER GI ENDOSCOPY     Family Status  Relation Name Status   Father  Deceased   Mother  Alive   Brother  Alive   MGM  Deceased   MGF  Deceased    PGM  Deceased   PGF  Deceased   Brother  Alive   Family History  Problem Relation Age of Onset   Alcohol abuse Father    Breast cancer Mother 51   Anxiety disorder Brother    Other Maternal Grandmother        unknown medical history   Other Maternal Grandfather        unknow medical history   Other Paternal Grandmother        unknown medical history   Other Paternal Grandfather        unknown medical history   Healthy Brother    Social History   Socioeconomic History   Marital status: Married    Spouse name: Not on file   Number of children: Not on file   Years of education: Not on file   Highest education level: Not on file  Occupational History   Not on file  Tobacco Use   Smoking status: Former    Types: Cigarettes    Quit date: 03/19/1981    Years since quitting: 42.1   Smokeless tobacco: Never  Vaping Use   Vaping Use: Never used  Substance and Sexual Activity   Alcohol use: Not Currently  Alcohol/week: 24.0 standard drinks of alcohol    Types: 24 Shots of liquor per week    Comment: last used 09/2021   Drug use: Not Currently    Types: Marijuana    Comment: last use 09/2021   Sexual activity: Not Currently  Other Topics Concern   Not on file  Social History Narrative   Lives at home with his wife and takes care of his wife. Independent at baseline      - Biggest strain is financial but doesn't think they could got get more help.      Patient expressed interest in possible financial assistance. Informed written consent obtained   Social Determinants of Health   Financial Resource Strain: High Risk (09/18/2020)   Overall Financial Resource Strain (CARDIA)    Difficulty of Paying Living Expenses: Very hard  Food Insecurity: No Food Insecurity (01/22/2023)   Hunger Vital Sign    Worried About Running Out of Food in the Last Year: Never true    Ran Out of Food in the Last Year: Never true  Transportation Needs: No Transportation Needs (01/22/2023)    PRAPARE - Administrator, Civil Service (Medical): No    Lack of Transportation (Non-Medical): No  Physical Activity: Sufficiently Active (09/18/2020)   Exercise Vital Sign    Days of Exercise per Week: 7 days    Minutes of Exercise per Session: 60 min  Stress: Stress Concern Present (09/18/2020)   [employer] of Occupational Health - Occupational Stress Questionnaire    Feeling of Stress : Very much  Social Connections: Socially Isolated (09/18/2020)   Social Connection and Isolation Panel [NHANES]    Frequency of Communication with Friends and Family: Once a week    Frequency of Social Gatherings with Friends and Family: Once a week    Attends Religious Services: Never    Database administrator or Organizations: No    Attends Banker Meetings: Never    Marital Status: Married   Outpatient Medications Prior to Visit  Medication Sig   sertraline (ZOLOFT) 25 MG tablet Take 1 tablet (25 mg total) by mouth at bedtime.   traZODone (DESYREL) 100 MG tablet Take 50-100 mg by mouth at bedtime as needed.   [DISCONTINUED] albuterol (PROAIR HFA) 108 (90 Base) MCG/ACT inhaler Inhale 2 puffs into the lungs once every 4 (four) hours as needed.   [DISCONTINUED] docusate sodium (COLACE) 100 MG capsule Take 1 capsule (100 mg total) by mouth 2 (two) times daily.   [DISCONTINUED] fluticasone furoate-vilanterol (BREO ELLIPTA) 200-25 MCG/ACT AEPB Inhale one puff by mouth daily   [DISCONTINUED] hydrOXYzine (ATARAX) 25 MG tablet Take 1 tablet (25 mg total) by mouth 3 (three) times daily as needed for anxiety.   [DISCONTINUED] lisinopril (ZESTRIL) 40 MG tablet TAKE ONE TABLET BY MOUTH ONCE EVERY DAY.   [DISCONTINUED] metoprolol tartrate (LOPRESSOR) 25 MG tablet Take 1 tablet (25 mg total) by mouth once daily.   [DISCONTINUED] polyethylene glycol powder (MIRALAX) 17 GM/SCOOP powder Take 17 g by mouth 2 (two) times daily as needed for moderate constipation.   [DISCONTINUED]  senna-docusate (SENOKOT-S) 8.6-50 MG tablet Take 1 tablet by mouth at bedtime as needed for mild constipation.   [DISCONTINUED] allopurinol (ZYLOPRIM) 100 MG tablet Take 1 tablet (100 mg total) by mouth daily.   [DISCONTINUED] colchicine 0.6 MG tablet Take 1 tablet (0.6 mg total) by mouth daily for 3 days.   No facility-administered medications prior to visit.   No Known Allergies  Immunization History  Administered Date(s) Administered   Influenza,inj,Quad PF,6+ Mos 12/28/2017, 08/05/2018, 08/01/2019, 09/18/2020, 09/13/2021   Influenza-Unspecified 08/28/2016   Pneumococcal Polysaccharide-23 12/28/2017   Tdap 12/08/2016, 08/16/2017, 07/16/2019, 02/14/2020    Health Maintenance  Topic Date Due   COVID-19 Vaccine (1) Never done   Hepatitis C Screening  Never done   Zoster Vaccines- Shingrix (1 of 2) Never done   INFLUENZA VACCINE  06/17/2023   Colonoscopy  12/25/2023   DTaP/Tdap/Td (5 - Td or Tdap) 02/13/2030   HIV Screening  Completed   HPV VACCINES  Aged Out    Patient Care Team: Jacky Kindle, FNP as PCP - General (Family Medicine)  Review of Systems   Objective    BP 110/80 (BP Location: Right Arm, Patient Position: Sitting, Cuff Size: Normal)   Pulse 75   Ht 6' (1.829 m)   Wt 188 lb (85.3 kg)   SpO2 98%   BMI 25.50 kg/m   Physical Exam Vitals and nursing note reviewed.  Constitutional:      Appearance: Normal appearance. He is normal weight.  HENT:     Head: Normocephalic and atraumatic.  Eyes:     Pupils: Pupils are equal, round, and reactive to light.  Cardiovascular:     Rate and Rhythm: Normal rate and regular rhythm.     Pulses: Normal pulses.     Heart sounds: Normal heart sounds.  Pulmonary:     Effort: Pulmonary effort is normal.     Breath sounds: Wheezing present.  Abdominal:     General: Bowel sounds are normal. There is distension.     Palpations: Abdomen is soft.     Tenderness: There is no abdominal tenderness.  Musculoskeletal:         General: Normal range of motion.     Cervical back: Normal range of motion.  Skin:    General: Skin is warm and dry.     Capillary Refill: Capillary refill takes less than 2 seconds.  Neurological:     General: No focal deficit present.     Mental Status: He is alert and oriented to person, place, and time. Mental status is at baseline.  Psychiatric:        Mood and Affect: Mood is anxious.        Speech: Speech is rapid and pressured.        Behavior: Behavior normal. Behavior is cooperative.        Thought Content: Thought content is paranoid. Thought content does not include homicidal or suicidal ideation. Thought content does not include homicidal or suicidal plan.        Cognition and Memory: Cognition is impaired. He exhibits impaired recent memory.    Depression Screen    04/16/2023    3:07 PM 10/29/2022    5:13 PM 10/14/2022   11:13 AM 09/22/2022    2:16 PM  PHQ 2/9 Scores  PHQ - 2 Score 0 2 2 3   PHQ- 9 Score 2 4 6 15    No results found for any visits on 04/16/23.  Assessment & Plan      Problem List Items Addressed This Visit       Cardiovascular and Mediastinum   Hypertension    Chronic, at goal Repeat labs Continue meds -lisinopril 40 mg  -metop xl 25 mg       Relevant Medications   metoprolol succinate (TOPROL XL) 25 MG 24 hr tablet   lisinopril (ZESTRIL) 40 MG tablet  Respiratory   Centrilobular emphysema (HCC)    Acute on chronic with recent flare and wheezing/doe Recommend abx and steroids to assit Continue breo and prn albuterol  Can refer to lung cancer screening at later date and f/u with pulm if warranted       Relevant Medications   albuterol (PROAIR HFA) 108 (90 Base) MCG/ACT inhaler   metoprolol succinate (TOPROL XL) 25 MG 24 hr tablet   fluticasone furoate-vilanterol (BREO ELLIPTA) 200-25 MCG/ACT AEPB   azithromycin (ZITHROMAX) 500 MG tablet   doxycycline (VIBRA-TABS) 100 MG tablet   predniSONE (DELTASONE) 50 MG tablet      Digestive   Alcoholic cirrhosis of liver without ascites (HCC) - Primary    Chronic, unknown Repeat labs No active signs of scleral icterus or jaundice Rounded, distended belly Nervousness/anxiety with tremor Reports recent "slip" which resulted in hospital stay and self harm attempt No current c/f SI/HI      Relevant Orders   Lipid panel   Hepatitis C Antibody   Hep B Core Ab W/Reflex   Hepatitis A Ab, Total     Endocrine   Hyperthyroidism    Chronic, unknown Repeat labs Endorses tremor Continue to monitor s/s No palpitations; or difficulty sleeping       Relevant Medications   metoprolol succinate (TOPROL XL) 25 MG 24 hr tablet   Other Relevant Orders   TSH + free T4     Other   Elevated serum glucose    Recommend A1c Continue to recommend balanced, lower carb meals. Smaller meal size, adding snacks. Choosing water as drink of choice and increasing purposeful exercise.       Relevant Orders   Hemoglobin A1c   Encounter for hepatitis C screening test for low risk patient    Low risk screen Treatable, and curable. If left untreated Hep C can lead to cirrhosis and liver failure. Encourage routine testing; recommend repeat testing if risk factors change. Recommend further hep screening iso etoh abuse/misuse       Relevant Orders   Hepatitis C Antibody   Encounter for screening for HIV    Low risk screen Consented; encouraged to "know your status" Recommend repeat screen if risk factors change       Relevant Orders   HIV antibody (with reflex)   Establishing care with new doctor, encounter for    Has not been seen since he got medicaid and [facility] Things to do to keep yourself healthy  - Exercise at least 30-45 minutes a day, 3-4 days a week.  - Eat a low-fat diet with lots of fruits and vegetables, up to 7-9 servings per day.  - Seatbelts can save your life. Wear them always.  - Smoke detectors on every level of your home,  check batteries every year.  - Eye Doctor - have an eye exam every 1-2 years  - Safe sex - if you may be exposed to STDs, use a condom.  - Alcohol -  If you drink, do it moderately, less than 2 drinks per day.  - Health Care Power of Attorney. Choose someone to speak for you if you are not able.  - Depression is common in our stressful world.If you're feeling down or losing interest in things you normally enjoy, please come in for a visit.  - Violence - If anyone is threatening or hurting you, please call immediately.       Generalized anxiety disorder  Chronic, variable Hx of multiple self harm attempts Unclear etiology Do not feel it is GAD alone Increase zoloft to assist; continue to recommend pt establish with previous psychiatry providers to assist with further needs       Relevant Medications   sertraline (ZOLOFT) 50 MG tablet   Gout    Chronic, well controlled Repeat uric acid levels Previously on allopurinol and prn colchicine; defer refill of NSAID at this time given no active c/o flare       Relevant Medications   allopurinol (ZYLOPRIM) 100 MG tablet   Other Relevant Orders   Uric acid   Mixed hyperlipidemia    Chronic, unknown The 10-year ASCVD risk score (Arnett DK, et al., 2019) is: 6.8% Repeat LP recommend diet low in saturated fat and regular exercise - 30 min at least 5 times per week Not on statin at this time      Relevant Medications   metoprolol succinate (TOPROL XL) 25 MG 24 hr tablet   lisinopril (ZESTRIL) 40 MG tablet   Other Relevant Orders   Lipid panel   Severe recurrent major 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 (HCC)    Chronic, variable Endorses stressors of bringing wife home from rehab without much skill set/help at home Previously on zoloft- will titrate up Continue trazodone to assist       Relevant Medications   sertraline (ZOLOFT) 50 MG tablet   RESOLVED: Shortness of breath   Relevant Medications   albuterol (PROAIR HFA) 108  (90 Base) MCG/ACT inhaler   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    Leilani Merl, FNP, have reviewed all documentation for this visit. The documentation on 04/16/23 for the exam, diagnosis, procedures, and orders are all accurate and complete.  Jacky Kindle, FNP  [facility] [phone] (phone) [phone] (fax)  [facility]

## 2023-04-16 NOTE — Assessment & Plan Note (Signed)
Low risk screen ?Consented; encouraged to "know your status" ?Recommend repeat screen if risk factors change ? ?

## 2023-04-16 NOTE — Assessment & Plan Note (Signed)
Chronic, variable Hx of multiple self harm attempts Unclear etiology Do not feel it is GAD alone Increase zoloft to assist; continue to recommend pt establish with previous psychiatry providers to assist with further needs

## 2023-04-16 NOTE — Assessment & Plan Note (Signed)
Recommend A1c Continue to recommend balanced, lower carb meals. Smaller meal size, adding snacks. Choosing water as drink of choice and increasing purposeful exercise.  

## 2023-04-17 LAB — BASIC METABOLIC PANEL
BUN/Creatinine Ratio: 13 (ref 10–24)
BUN: 16 mg/dL (ref 8–27)
CO2: 19 mmol/L — ABNORMAL LOW (ref 20–29)
Calcium: 9 mg/dL (ref 8.6–10.2)
Chloride: 108 mmol/L — ABNORMAL HIGH (ref 96–106)
Creatinine, Ser: 1.23 mg/dL (ref 0.76–1.27)
Glucose: 86 mg/dL (ref 70–99)
Potassium: 4.5 mmol/L (ref 3.5–5.2)
Sodium: 142 mmol/L (ref 134–144)
eGFR: 66 mL/min/{1.73_m2} (ref >59)

## 2023-04-17 LAB — LIPID PANEL
Chol/HDL Ratio: 2.1 ratio (ref 0.0–5.0)
Cholesterol, Total: 118 mg/dL (ref 100–199)
HDL: 57 mg/dL (ref >39)
LDL Chol Calc (NIH): 42 mg/dL (ref 0–99)
Triglycerides: 105 mg/dL (ref 0–149)
VLDL Cholesterol Cal: 19 mg/dL (ref 5–40)

## 2023-04-17 LAB — HEPATITIS C ANTIBODY: Hep C Virus Ab: NONREACTIVE

## 2023-04-17 LAB — TSH+FREE T4
Free T4: 1.08 ng/dL (ref 0.82–1.77)
TSH: 0.266 u[IU]/mL — ABNORMAL LOW (ref 0.450–4.500)

## 2023-04-17 LAB — HEPATITIS B CORE AB W/REFLEX: Hep B Core Total Ab: NEGATIVE

## 2023-04-17 LAB — CBC
Hematocrit: 34.2 % — ABNORMAL LOW (ref 37.5–51.0)
Hemoglobin: 11 g/dL — ABNORMAL LOW (ref 13.0–17.7)
MCH: 29.6 pg (ref 26.6–33.0)
MCHC: 32.2 g/dL (ref 31.5–35.7)
MCV: 92 fL (ref 79–97)
Platelets: 236 10*3/uL (ref 150–450)
RBC: 3.72 x10E6/uL — ABNORMAL LOW (ref 4.14–5.80)
RDW: 13.6 % (ref 11.6–15.4)
WBC: 5.9 10*3/uL (ref 3.4–10.8)

## 2023-04-17 LAB — HEMOGLOBIN A1C
Est. average glucose Bld gHb Est-mCnc: 114 mg/dL
Hgb A1c MFr Bld: 5.6 % (ref 4.8–5.6)

## 2023-04-17 LAB — HEPATITIS A ANTIBODY, TOTAL: hep A Total Ab: NEGATIVE

## 2023-04-17 LAB — PSA: Prostate Specific Ag, Serum: 19.6 ng/mL — ABNORMAL HIGH (ref 0.0–4.0)

## 2023-04-17 LAB — URIC ACID: Uric Acid: 7 mg/dL (ref 3.8–8.4)

## 2023-04-17 LAB — HIV ANTIBODY (ROUTINE TESTING W REFLEX): HIV Screen 4th Generation wRfx: NONREACTIVE

## 2023-04-18 ENCOUNTER — Other Ambulatory Visit: Payer: Self-pay | Admitting: Family Medicine

## 2023-04-18 DIAGNOSIS — R972 Elevated prostate specific antigen [PSA]: Secondary | ICD-10-CM

## 2023-04-18 DIAGNOSIS — E0590 Thyrotoxicosis, unspecified without thyrotoxic crisis or storm: Secondary | ICD-10-CM

## 2023-04-18 NOTE — Progress Notes (Signed)
Thyroid continues to show hyperthyroidism. PSA is also very elevated. Anemia remains. Uric acid is stable at 7%. Recommend thyroid US and follow up with endocrinology and urology.

## 2023-04-20 ENCOUNTER — Other Ambulatory Visit: Payer: Self-pay | Admitting: Family Medicine

## 2023-04-20 ENCOUNTER — Telehealth: Payer: Self-pay | Admitting: *Deleted

## 2023-04-20 ENCOUNTER — Telehealth: Payer: Self-pay | Admitting: Family Medicine

## 2023-04-20 MED ORDER — TRAZODONE HCL 100 MG PO TABS: 100.0000 mg | ORAL_TABLET | Freq: Every evening | ORAL | 3 refills | Status: DC | PRN

## 2023-04-20 MED ORDER — FLUTICASONE-SALMETEROL 100-50 MCG/ACT IN AEPB: 1.0000 | INHALATION_SPRAY | Freq: Two times a day (BID) | RESPIRATORY_TRACT | 11 refills | Status: DC

## 2023-04-20 NOTE — Telephone Encounter (Signed)
Pt requesting trazodone refill, Advair instead Breo, --[facility]

## 2023-04-20 NOTE — Telephone Encounter (Signed)
Pateint stated he has all his meds except his Advair. He is in need of this as taking a lot of the other makes him jettery. Please contact pt as medication was not on his medication list. Pt also stated his allopurinol (ZYLOPRIM) 100 MG tablet is more expensive than before and want the provider to look into that.

## 2023-04-20 NOTE — Telephone Encounter (Signed)
[employer]eds is requesting prior authorization Key: [id number] Name: Miguel [name] Inhub 100-50MCG/ACT Aerosol Powder

## 2023-04-21 NOTE — Telephone Encounter (Signed)
Pt stated--prefer to have advair and Fluticasone furoate does not work--

## 2023-04-23 NOTE — Telephone Encounter (Signed)
Pharmacy advised to fill Advair as brand name. No PA needed. They will call patient when medication is ready.

## 2023-04-27 ENCOUNTER — Ambulatory Visit: Payer: Medicaid Other

## 2023-04-28 ENCOUNTER — Emergency Department: Payer: Medicaid Other

## 2023-04-28 ENCOUNTER — Observation Stay
Admission: EM | Admit: 2023-04-28 | Discharge: 2023-04-29 | Disposition: A | Discharge status: Home / Self Care | Payer: Medicaid Other | Attending: Internal Medicine | Admitting: Internal Medicine

## 2023-04-28 DIAGNOSIS — F32A Depression, unspecified: Secondary | ICD-10-CM | POA: Insufficient documentation

## 2023-04-28 DIAGNOSIS — E8729 Other acidosis: Secondary | ICD-10-CM | POA: Diagnosis not present

## 2023-04-28 DIAGNOSIS — Z79899 Other long term (current) drug therapy: Secondary | ICD-10-CM | POA: Insufficient documentation

## 2023-04-28 DIAGNOSIS — F10988 Alcohol use, unspecified with other alcohol-induced disorder: Secondary | ICD-10-CM | POA: Diagnosis not present

## 2023-04-28 DIAGNOSIS — A419 Sepsis, unspecified organism: Principal | ICD-10-CM | POA: Insufficient documentation

## 2023-04-28 DIAGNOSIS — Z87891 Personal history of nicotine dependence: Secondary | ICD-10-CM | POA: Insufficient documentation

## 2023-04-28 DIAGNOSIS — E876 Hypokalemia: Secondary | ICD-10-CM

## 2023-04-28 DIAGNOSIS — I10 Essential (primary) hypertension: Secondary | ICD-10-CM | POA: Insufficient documentation

## 2023-04-28 DIAGNOSIS — R55 Syncope and collapse: Secondary | ICD-10-CM | POA: Diagnosis present

## 2023-04-28 DIAGNOSIS — E871 Hypo-osmolality and hyponatremia: Secondary | ICD-10-CM | POA: Insufficient documentation

## 2023-04-28 DIAGNOSIS — R4182 Altered mental status, unspecified: Secondary | ICD-10-CM

## 2023-04-28 DIAGNOSIS — F1010 Alcohol abuse, uncomplicated: Secondary | ICD-10-CM | POA: Diagnosis present

## 2023-04-28 DIAGNOSIS — J45909 Unspecified asthma, uncomplicated: Secondary | ICD-10-CM | POA: Diagnosis not present

## 2023-04-28 DIAGNOSIS — M109 Gout, unspecified: Secondary | ICD-10-CM | POA: Diagnosis present

## 2023-04-28 LAB — CBC WITH DIFFERENTIAL/PLATELET
Abs Immature Granulocytes: 0.04 10*3/uL (ref 0.00–0.07)
Basophils Absolute: 0 10*3/uL (ref 0.0–0.1)
Basophils Relative: 1 %
Eosinophils Absolute: 0.1 10*3/uL (ref 0.0–0.5)
Eosinophils Relative: 1 %
HCT: 30.7 % — ABNORMAL LOW (ref 39.0–52.0)
Hemoglobin: 9.7 g/dL — ABNORMAL LOW (ref 13.0–17.0)
Immature Granulocytes: 1 %
Lymphocytes Relative: 26 %
Lymphs Abs: 2.3 10*3/uL (ref 0.7–4.0)
MCH: 29.4 pg (ref 26.0–34.0)
MCHC: 31.6 g/dL (ref 30.0–36.0)
MCV: 93 fL (ref 80.0–100.0)
Monocytes Absolute: 1 10*3/uL (ref 0.1–1.0)
Monocytes Relative: 11 %
Neutro Abs: 5.1 10*3/uL (ref 1.7–7.7)
Neutrophils Relative %: 60 %
Platelets: 223 10*3/uL (ref 150–400)
RBC: 3.3 MIL/uL — ABNORMAL LOW (ref 4.22–5.81)
RDW: 14.2 % (ref 11.5–15.5)
WBC: 8.5 10*3/uL (ref 4.0–10.5)
nRBC: 0 % (ref 0.0–0.2)

## 2023-04-28 LAB — URINALYSIS, W/ REFLEX TO CULTURE (INFECTION SUSPECTED)
Bacteria, UA: NONE SEEN
Bilirubin Urine: NEGATIVE
Glucose, UA: NEGATIVE mg/dL
Ketones, ur: NEGATIVE mg/dL
Leukocytes,Ua: NEGATIVE
Nitrite: NEGATIVE
Protein, ur: NEGATIVE mg/dL
Specific Gravity, Urine: 1.002 — ABNORMAL LOW (ref 1.005–1.030)
Squamous Epithelial / HPF: NONE SEEN /HPF (ref 0–5)
pH: 7 (ref 5.0–8.0)

## 2023-04-28 LAB — COMPREHENSIVE METABOLIC PANEL
ALT: 8 U/L (ref 0–44)
AST: 10 U/L — ABNORMAL LOW (ref 15–41)
Albumin: 2.3 g/dL — ABNORMAL LOW (ref 3.5–5.0)
Alkaline Phosphatase: 41 U/L (ref 38–126)
Anion gap: 7 (ref 5–15)
BUN: 12 mg/dL (ref 8–23)
CO2: 20 mmol/L — ABNORMAL LOW (ref 22–32)
Calcium: 6.3 mg/dL — CL (ref 8.9–10.3)
Chloride: 115 mmol/L — ABNORMAL HIGH (ref 98–111)
Creatinine, Ser: 0.83 mg/dL (ref 0.61–1.24)
GFR, Estimated: >60 mL/min (ref >60)
Glucose, Bld: 96 mg/dL (ref 70–99)
Potassium: 2.4 mmol/L — CL (ref 3.5–5.1)
Sodium: 142 mmol/L (ref 135–145)
Total Bilirubin: 0.5 mg/dL (ref 0.3–1.2)
Total Protein: 4.2 g/dL — ABNORMAL LOW (ref 6.5–8.1)

## 2023-04-28 LAB — URINE DRUG SCREEN, QUALITATIVE (ARMC ONLY)
Amphetamines, Ur Screen: NOT DETECTED
Barbiturates, Ur Screen: NOT DETECTED
Benzodiazepine, Ur Scrn: POSITIVE — AB
Cannabinoid 50 Ng, Ur ~~LOC~~: POSITIVE — AB
Cocaine Metabolite,Ur ~~LOC~~: NOT DETECTED
MDMA (Ecstasy)Ur Screen: NOT DETECTED
Methadone Scn, Ur: NOT DETECTED
Opiate, Ur Screen: NOT DETECTED
Phencyclidine (PCP) Ur S: NOT DETECTED
Tricyclic, Ur Screen: NOT DETECTED

## 2023-04-28 LAB — ETHANOL: Alcohol, Ethyl (B): 344 mg/dL (ref <10)

## 2023-04-28 LAB — LACTIC ACID, PLASMA
Lactic Acid, Venous: 3.6 mmol/L (ref 0.5–1.9)
Lactic Acid, Venous: 3.7 mmol/L (ref 0.5–1.9)

## 2023-04-28 LAB — ACETAMINOPHEN LEVEL: Acetaminophen (Tylenol), Serum: <10 ug/mL — ABNORMAL LOW (ref 10–30)

## 2023-04-28 LAB — TROPONIN I (HIGH SENSITIVITY)
Troponin I (High Sensitivity): <2 ng/L (ref <18)
Troponin I (High Sensitivity): 3 ng/L (ref <18)

## 2023-04-28 LAB — SALICYLATE LEVEL: Salicylate Lvl: <7 mg/dL — ABNORMAL LOW (ref 7.0–30.0)

## 2023-04-28 MED ORDER — FENTANYL CITRATE PF 50 MCG/ML IJ SOSY
50.0000 ug | PREFILLED_SYRINGE | Freq: Once | INTRAMUSCULAR | Status: AC
  Administered 2023-04-28: 50 ug via INTRAVENOUS
  Filled 2023-04-28: qty 1

## 2023-04-28 MED ORDER — MAGNESIUM HYDROXIDE 400 MG/5ML PO SUSP: 30.0000 mL | Freq: Every day | ORAL | Status: DC | PRN

## 2023-04-28 MED ORDER — POTASSIUM CHLORIDE 10 MEQ/100ML IV SOLN
10.0000 meq | Freq: Once | INTRAVENOUS | Status: AC
  Administered 2023-04-28: 10 meq via INTRAVENOUS
  Filled 2023-04-28: qty 100

## 2023-04-28 MED ORDER — ACETAMINOPHEN 325 MG PO TABS: 650.0000 mg | ORAL_TABLET | Freq: Four times a day (QID) | ORAL | Status: DC | PRN

## 2023-04-28 MED ORDER — LISINOPRIL 10 MG PO TABS
40.0000 mg | ORAL_TABLET | Freq: Every day | ORAL | Status: DC
  Administered 2023-04-29: 40 mg via ORAL
  Filled 2023-04-28: qty 4

## 2023-04-28 MED ORDER — TRAZODONE HCL 100 MG PO TABS
100.0000 mg | ORAL_TABLET | Freq: Every evening | ORAL | Status: DC | PRN
  Administered 2023-04-29: 100 mg via ORAL
  Filled 2023-04-28 (×2): qty 1

## 2023-04-28 MED ORDER — ALBUTEROL SULFATE HFA 108 (90 BASE) MCG/ACT IN AERS: 1.0000 | INHALATION_SPRAY | RESPIRATORY_TRACT | Status: DC | PRN

## 2023-04-28 MED ORDER — ONDANSETRON HCL 4 MG/2ML IJ SOLN: 4.0000 mg | Freq: Four times a day (QID) | INTRAMUSCULAR | Status: DC | PRN

## 2023-04-28 MED ORDER — VANCOMYCIN HCL IN DEXTROSE 1-5 GM/200ML-% IV SOLN: 1000.0000 mg | Freq: Once | INTRAVENOUS | Status: DC

## 2023-04-28 MED ORDER — SODIUM CHLORIDE 0.9 % IV SOLN
2.0000 g | Freq: Once | INTRAVENOUS | Status: AC
  Administered 2023-04-28: 2 g via INTRAVENOUS
  Filled 2023-04-28: qty 12.5

## 2023-04-28 MED ORDER — FLUTICASONE FUROATE-VILANTEROL 100-25 MCG/ACT IN AEPB
1.0000 | INHALATION_SPRAY | Freq: Every day | RESPIRATORY_TRACT | Status: DC
  Filled 2023-04-28: qty 28

## 2023-04-28 MED ORDER — PREDNISONE 20 MG PO TABS: 50.0000 mg | ORAL_TABLET | Freq: Every day | ORAL | Status: DC

## 2023-04-28 MED ORDER — SERTRALINE HCL 50 MG PO TABS: 25.0000 mg | ORAL_TABLET | Freq: Every day | ORAL | Status: DC

## 2023-04-28 MED ORDER — SODIUM CHLORIDE 0.9 % IV SOLN: INTRAVENOUS | Status: DC

## 2023-04-28 MED ORDER — CALCIUM GLUCONATE-NACL 1-0.675 GM/50ML-% IV SOLN
1.0000 g | Freq: Once | INTRAVENOUS | Status: AC
  Administered 2023-04-28: 1000 mg via INTRAVENOUS
  Filled 2023-04-28: qty 50

## 2023-04-28 MED ORDER — ACETAMINOPHEN 650 MG RE SUPP: 650.0000 mg | Freq: Four times a day (QID) | RECTAL | Status: DC | PRN

## 2023-04-28 MED ORDER — ENOXAPARIN SODIUM 40 MG/0.4ML IJ SOSY
40.0000 mg | PREFILLED_SYRINGE | INTRAMUSCULAR | Status: DC
  Filled 2023-04-28: qty 0.4

## 2023-04-28 MED ORDER — ALLOPURINOL 100 MG PO TABS
100.0000 mg | ORAL_TABLET | Freq: Every day | ORAL | Status: DC
  Administered 2023-04-29: 100 mg via ORAL
  Filled 2023-04-28: qty 1

## 2023-04-28 MED ORDER — VANCOMYCIN HCL 2000 MG/400ML IV SOLN
2000.0000 mg | Freq: Once | INTRAVENOUS | Status: AC
  Administered 2023-04-29: 2000 mg via INTRAVENOUS
  Filled 2023-04-28: qty 400

## 2023-04-28 MED ORDER — TRAZODONE HCL 50 MG PO TABS: 25.0000 mg | ORAL_TABLET | Freq: Every evening | ORAL | Status: DC | PRN

## 2023-04-28 MED ORDER — ONDANSETRON HCL 4 MG PO TABS: 4.0000 mg | ORAL_TABLET | Freq: Four times a day (QID) | ORAL | Status: DC | PRN

## 2023-04-28 MED ORDER — LACTATED RINGERS IV BOLUS (SEPSIS)
2000.0000 mL | Freq: Once | INTRAVENOUS | Status: AC
  Administered 2023-04-28: 2000 mL via INTRAVENOUS

## 2023-04-28 MED ORDER — METOPROLOL SUCCINATE ER 25 MG PO TB24
25.0000 mg | ORAL_TABLET | Freq: Every day | ORAL | Status: DC
  Administered 2023-04-29: 25 mg via ORAL
  Filled 2023-04-28: qty 1

## 2023-04-28 MED ORDER — SERTRALINE HCL 50 MG PO TABS: 50.0000 mg | ORAL_TABLET | Freq: Every day | ORAL | Status: DC

## 2023-04-28 NOTE — ED Provider Notes (Signed)
[facility] Provider Note    Event Date/Time   First MD Initiated Contact with Patient 04/28/23 1926     (approximate)   History   Altered Mental Status   HPI  Miguel [name] is a 62 y.o. male with history of hypertension, GERD, anxiety, and chronic abdominal pain as well as alcohol abuse who presents with syncope and altered mental status.  Per EMS, they were called out after the patient passed out in a chair.  On their arrival the patient appeared confused and was intermittently agitated.  They gave Haldol and Versed.  After this the patient became more lethargic and was hypotensive.  The patient is unable to give much history but when asked if he has any pain he points to the left flank.  He denies drinking alcohol.  I reviewed the past medical records.  The patient was seen in the ED and admitted to inpatient psychiatry service last month; per the discharge summary from Dr. Toni Amend from 5/22.  He was treated for alcohol induced mood disorder with self-harm.    Physical Exam   Triage Vital Signs: ED Triage Vitals [04/28/23 1923]  Enc Vitals Group     BP (!) 83/66     Pulse Rate 68     Resp 15     Temp 98.1 F (36.7 C)     Temp Source Oral     SpO2 93 %     Weight      Height      Head Circumference      Peak Flow      Pain Score      Pain Loc      Pain Edu?      Excl. in GC?     Most recent vital signs: Vitals:   04/28/23 2000 04/28/23 2200  BP: (!) 92/58 (!) 120/95  Pulse: 67 63  Resp: 18 14  Temp:  98.5 F (36.9 C)  SpO2: 96% 100%    General: Somnolent but arousable, confused, somewhat restless in the bed.  Oriented x 1. CV:  Good peripheral perfusion.  Normal heart sounds. Resp:  Normal effort.  Lungs CTAB. Abd:  No distention.  Left flank tenderness.  No peritoneal signs. Other:  EOMI.  PERRLA.  No gaze deviation.  Motor intact in all extremities.  Slurred speech.  Somewhat dry mucous membranes.   ED Results / Procedures /  Treatments   Labs (all labs ordered are listed, but only abnormal results are displayed) Labs Reviewed  LACTIC ACID, PLASMA - Abnormal; Notable for the following components:      Result Value   Lactic Acid, Venous 3.6 (*)    All other components within normal limits  LACTIC ACID, PLASMA - Abnormal; Notable for the following components:   Lactic Acid, Venous 3.7 (*)    All other components within normal limits  COMPREHENSIVE METABOLIC PANEL - Abnormal; Notable for the following components:   Potassium 2.4 (*)    Chloride 115 (*)    CO2 20 (*)    Calcium 6.3 (*)    Total Protein 4.2 (*)    Albumin 2.3 (*)    AST 10 (*)    All other components within normal limits  CBC WITH DIFFERENTIAL/PLATELET - Abnormal; Notable for the following components:   RBC 3.30 (*)    Hemoglobin 9.7 (*)    HCT 30.7 (*)    All other components within normal limits  URINALYSIS, W/ REFLEX TO CULTURE (  INFECTION SUSPECTED) - Abnormal; Notable for the following components:   Color, Urine COLORLESS (*)    APPearance CLEAR (*)    Specific Gravity, Urine 1.002 (*)    Hgb urine dipstick SMALL (*)    All other components within normal limits  ACETAMINOPHEN LEVEL - Abnormal; Notable for the following components:   Acetaminophen (Tylenol), Serum <10 (*)    All other components within normal limits  SALICYLATE LEVEL - Abnormal; Notable for the following components:   Salicylate Lvl <7.0 (*)    All other components within normal limits  ETHANOL - Abnormal; Notable for the following components:   Alcohol, Ethyl (B) 344 (*)    All other components within normal limits  URINE DRUG SCREEN, QUALITATIVE ([facility] ONLY) - Abnormal; Notable for the following components:   Cannabinoid 50 Ng, Ur [place] POSITIVE (*)    Benzodiazepine, Ur Scrn POSITIVE (*)    All other components within normal limits  CULTURE, BLOOD (ROUTINE X 2)  CULTURE, BLOOD (ROUTINE X 2)  TROPONIN I (HIGH SENSITIVITY)  TROPONIN I (HIGH SENSITIVITY)      EKG  ED ECG REPORT I, Dionne Bucy, the attending physician, personally viewed and interpreted this ECG.  Date: 04/28/2023 EKG Time: 2032 Rate: 64 Rhythm: normal sinus rhythm QRS Axis: normal Intervals: Prolonged QTc ST/T Wave abnormalities: normal Narrative Interpretation: no evidence of acute ischemia    RADIOLOGY  Chest x-ray: I independently viewed and interpreted the images; there is no focal consolidation or edema  CT head: No ICH or other acute abnormality  CT abdomen/pelvis:   IMPRESSION:  There is 9 mm calculus in the proximal course of right ureter at the  L3 level causing mild right hydronephrosis. Small bilateral renal  stones.    There is no evidence of intestinal obstruction or pneumoperitoneum.  Appendix is not dilated.    There is decreased density in submucosal region in right colon  suggesting possible chronic colitis.    Diverticula are seen in colon without signs of focal acute  diverticulitis. Enlarged prostate. Aortic arteriosclerosis.    Other findings as described in the body of the report.    PROCEDURES:  Critical Care performed: Yes, see critical care procedure note(s)  .Critical Care  Performed by: Dionne Bucy, MD Authorized by: Dionne Bucy, MD   Critical care provider statement:    Critical care time (minutes):  30   Critical care time was exclusive of:  Separately billable procedures and treating other patients   Critical care was necessary to treat or prevent imminent or life-threatening deterioration of the following conditions:  Circulatory failure   Critical care was time spent personally by me on the following activities:  Development of treatment plan with patient or surrogate, discussions with consultants, evaluation of patient's response to treatment, examination of patient, ordering and review of laboratory studies, ordering and review of radiographic studies, ordering and performing treatments  and interventions, pulse oximetry, re-evaluation of patient's condition, review of old charts and obtaining history from patient or surrogate   Care discussed with: admitting provider      MEDICATIONS ORDERED IN ED: Medications  ceFEPIme (MAXIPIME) 2 g in sodium chloride 0.9 % 100 mL IVPB (2 g Intravenous New Bag/Given 04/28/23 2332)  vancomycin (VANCOREADY) IVPB 2000 mg/400 mL (has no administration in time range)  lactated ringers bolus 2,000 mL (0 mLs Intravenous Stopped 04/28/23 2158)  potassium chloride 10 mEq in 100 mL IVPB (0 mEq Intravenous Stopped 04/28/23 2333)  potassium chloride 10 mEq in 100  mL IVPB (0 mEq Intravenous Stopped 04/28/23 2249)  calcium gluconate 1 g/ 50 mL sodium chloride IVPB (0 mg Intravenous Stopped 04/28/23 2159)  fentaNYL (SUBLIMAZE) injection 50 mcg (50 mcg Intravenous Given 04/28/23 2337)     IMPRESSION / MDM / ASSESSMENT AND PLAN / ED COURSE  I reviewed the triage vital signs and the nursing notes.  62 year old male with PMH as noted above presents with syncope and altered mental status.  Per EMS he was agitated and required sedation with Haldol and Versed although subsequently became lethargic and hypotensive.  On exam he is hypotensive with otherwise normal vital signs, confused, with no visible trauma and no focal neurologic deficits.  Differential diagnosis includes, but is not limited to, alcohol intoxication, other substance abuse, acute infection/sepsis, dehydration/hypovolemia, electrolyte abnormality, other metabolic disturbance, ICH or other CNS etiology, less likely cardiac cause.  I have ordered IV fluids, lab workup including cardiac labs and sepsis workup, CT head as well as CT of the abdomen to evaluate the flank pain, and chest x-ray.  Patient's presentation is most consistent with acute presentation with potential threat to life or bodily function.  The patient is on the cardiac monitor to evaluate for evidence of arrhythmia and/or significant  heart rate changes.   ----------------------------------------- 11:44 PM on 04/28/2023 -----------------------------------------   Lab workup is significant for elevated lactate, hypokalemia, hypocalcemia, as well as elevated ethanol level consistent with acute intoxication.  CT ab/pelvis showed a kidney stone which is consistent with the patient's pain which he now reports is in his right flank.  Urinalysis does not show evidence of UTI.  On reassessment the patient is much more alert and appears more comfortable.  Neurologic exam remains nonfocal.  Repeat lactate is elevated for the first despite fluids, so I have ordered additional fluids.  However, the blood pressure has normalized and remained stable for the last few hours.    Presentation is concerning for possible sepsis although there is no known source of infection at this time.  The patient has no headache or symptoms of meningitis.  I have ordered empiric broad-spectrum antibiotics.  The elevated lactate and other findings could also be due to dehydration and an alcohol abuse.  The patient will need admission for further workup and treatment.  I consulted Dr. Arville Care from the hospitalist service; based on our discussion he agrees to evaluate the patient for admission.  FINAL CLINICAL IMPRESSION(S) / ED DIAGNOSES   Final diagnoses:  Altered mental status, unspecified altered mental status type  Alcohol abuse  Sepsis, due to unspecified organism, unspecified whether acute organ dysfunction present (HCC)  Hypokalemia  Hyponatremia     Rx / DC Orders   ED Discharge Orders     None        Note:  This document was prepared using Dragon voice recognition software and may include unintentional dictation errors.    Dionne Bucy, MD 04/28/23 2351

## 2023-04-28 NOTE — Progress Notes (Signed)
PHARMACY -  BRIEF ANTIBIOTIC NOTE   Pharmacy has received consult(s) for Vancomycin from an ED provider.  The patient's profile has been reviewed for ht/wt/allergies/indication/available labs.    One time order(s) placed for Vancomycin 2 gm per pt wt: 85.3 kg  Further antibiotics/pharmacy consults should be ordered by admitting physician if indicated.                       Thank you, Otelia Sergeant, PharmD, [facility] 04/28/2023 11:28 PM

## 2023-04-28 NOTE — ED Notes (Signed)
Date and time results received: 04/28/23 2048 Test: Ca Critical Value: 6.3 Name of Provider Notified: S.Siadecki, MD

## 2023-04-28 NOTE — ED Triage Notes (Signed)
Pt arrived from home via [facility] s/p syncopal episode in chair. Pt oriented to person only. Pt fell 2 days ago hitting his head. Pt  wife denies alcohol use.

## 2023-04-28 NOTE — ED Notes (Addendum)
Date and time results received: 04/28/23 2033  Test: lactic acid Critical Value: 3.6  Name of Provider Notified: Asencion Partridge, MD

## 2023-04-28 NOTE — ED Notes (Signed)
Date and time results received: 04/28/23 2047  Test: K+ Critical Value: 2.4  Name of Provider Notified: Asencion Partridge, MD

## 2023-04-29 ENCOUNTER — Observation Stay (HOSPITAL_BASED_OUTPATIENT_CLINIC_OR_DEPARTMENT_OTHER)
Admit: 2023-04-29 | Discharge: 2023-04-29 | Disposition: A | Discharge status: Home / Self Care | Payer: Medicaid Other | Attending: Family Medicine | Admitting: Family Medicine

## 2023-04-29 ENCOUNTER — Encounter: Payer: Self-pay | Admitting: Family Medicine

## 2023-04-29 ENCOUNTER — Observation Stay: Payer: Medicaid Other

## 2023-04-29 ENCOUNTER — Other Ambulatory Visit: Payer: Self-pay

## 2023-04-29 DIAGNOSIS — R55 Syncope and collapse: Secondary | ICD-10-CM

## 2023-04-29 DIAGNOSIS — F32A Depression, unspecified: Secondary | ICD-10-CM | POA: Insufficient documentation

## 2023-04-29 DIAGNOSIS — E8729 Other acidosis: Secondary | ICD-10-CM

## 2023-04-29 DIAGNOSIS — E876 Hypokalemia: Secondary | ICD-10-CM

## 2023-04-29 DIAGNOSIS — I10 Essential (primary) hypertension: Secondary | ICD-10-CM | POA: Insufficient documentation

## 2023-04-29 LAB — CBC
HCT: 31.1 % — ABNORMAL LOW (ref 39.0–52.0)
Hemoglobin: 9.7 g/dL — ABNORMAL LOW (ref 13.0–17.0)
MCH: 29.3 pg (ref 26.0–34.0)
MCHC: 31.2 g/dL (ref 30.0–36.0)
MCV: 94 fL (ref 80.0–100.0)
Platelets: 201 10*3/uL (ref 150–400)
RBC: 3.31 MIL/uL — ABNORMAL LOW (ref 4.22–5.81)
RDW: 14.4 % (ref 11.5–15.5)
WBC: 5.9 10*3/uL (ref 4.0–10.5)
nRBC: 0 % (ref 0.0–0.2)

## 2023-04-29 LAB — ECHOCARDIOGRAM COMPLETE
AR max vel: 2.26 cm2
AV Area VTI: 2.19 cm2
AV Area mean vel: 2.28 cm2
AV Mean grad: 4 mmHg
AV Peak grad: 7.1 mmHg
Ao pk vel: 1.33 m/s
Area-P 1/2: 4.01 cm2
S' Lateral: 2.9 cm

## 2023-04-29 LAB — BASIC METABOLIC PANEL
Anion gap: 10 (ref 5–15)
BUN: 12 mg/dL (ref 8–23)
CO2: 21 mmol/L — ABNORMAL LOW (ref 22–32)
Calcium: 7.9 mg/dL — ABNORMAL LOW (ref 8.9–10.3)
Chloride: 113 mmol/L — ABNORMAL HIGH (ref 98–111)
Creatinine, Ser: 0.85 mg/dL (ref 0.61–1.24)
GFR, Estimated: >60 mL/min (ref >60)
Glucose, Bld: 89 mg/dL (ref 70–99)
Potassium: 3.9 mmol/L (ref 3.5–5.1)
Sodium: 144 mmol/L (ref 135–145)

## 2023-04-29 LAB — LACTIC ACID, PLASMA: Lactic Acid, Venous: 1.9 mmol/L (ref 0.5–1.9)

## 2023-04-29 MED ORDER — LORAZEPAM 2 MG/ML IJ SOLN: 1.0000 mg | INTRAMUSCULAR | Status: DC | PRN

## 2023-04-29 MED ORDER — POTASSIUM CHLORIDE IN NACL 20-0.9 MEQ/L-% IV SOLN
INTRAVENOUS | Status: DC
  Filled 2023-04-29 (×3): qty 1000

## 2023-04-29 MED ORDER — ALBUTEROL SULFATE (2.5 MG/3ML) 0.083% IN NEBU: 2.5000 mg | INHALATION_SOLUTION | RESPIRATORY_TRACT | Status: DC | PRN

## 2023-04-29 MED ORDER — FLUTICASONE PROPIONATE 50 MCG/ACT NA SUSP
2.0000 | Freq: Every day | NASAL | Status: DC
  Administered 2023-04-29: 2 via NASAL
  Filled 2023-04-29 (×2): qty 16

## 2023-04-29 NOTE — Progress Notes (Signed)
Patient awaiting pick up by taxi services for discharge to home

## 2023-04-29 NOTE — Assessment & Plan Note (Signed)
-   We will continue Zoloft and trazodone. 

## 2023-04-29 NOTE — H&P (Addendum)
[place] [facility]   PATIENT NAME: Miguel [name]    MR#:  [id number]  DATE OF BIRTH:  [birth year]  DATE OF ADMISSION:  04/28/2023  PRIMARY CARE PHYSICIAN: Jacky Kindle, FNP   Patient is coming from: Home  REQUESTING/REFERRING PHYSICIAN: Miki Kins, MD  CHIEF COMPLAINT:   Chief Complaint  Patient presents with   Altered Mental Status    HISTORY OF PRESENT ILLNESS:  Miguel [name] is a 62 y.o. male with medical history significant for anxiety, asthma, GERD, alcohol abuse, gout, hypertension, urolithiasis and OCD, who presented to the ER with acute onset of syncope and altered mental status.  He passed out in a chair before EMS was called.  Upon EMS arrival the patient was confused and intermittently agitated.  He was given Haldol and Versed after which he became more lethargic and was hypotensive.  His last alcoholic drink was last night.  He admitted to drinking 3 shots then.  No nausea or vomiting or diarrhea.  No chest pain or palpitations.  No cough or wheezing or dyspnea.  He had mild dysuria without urinary frequency or urgency or flank pain.  No bleeding diathesis. The patient was recently admitted to the ER and discharged on 5/22 after having an self-inflicted cut on his left wrist while he was alcohol intoxicated.  He currently denies any suicidal thoughts.  ED Course: When he came to the ER, heart rate was 103 with otherwise normal vital signs.  Later on respiratory rate was 27.  Labs revealed hypokalemia 2.4 and hypocalcemia of 6.3 with a CO2 of 20 and chloride of 115.  LFTs were unremarkable.  Lactic acid was 3.6 and later 3.7.  High sensitive troponin I was less than 2 and later 3.  CBC showed anemia slightly worse than previous levels with hemoglobin 9.7 hematocrit 30.7.  Tylenol level was less than 10 and salicylate less than 7.  UA was unremarkable.  EKG as reviewed by me : EKG showed normal sinus rhythm rate 64 with low voltage QRS and Q waves anteroseptally. Imaging:  Portable chest x-ray showed no acute cardiopulmonary disease. Abdominal pelvic CT scan without contrast revealed the following: There is 9 mm calculus in the proximal course of right ureter at the L3 level causing mild right hydronephrosis. Small bilateral renal stones.   There is no evidence of intestinal obstruction or pneumoperitoneum. Appendix is not dilated.   There is decreased density in submucosal region in right colon suggesting possible chronic colitis.   Diverticula are seen in colon without signs of focal acute diverticulitis. Enlarged prostate. Aortic arteriosclerosis.   Other findings as described in the body of the report.  Noncontrasted CT scan revealed no acute intracranial abnormalities.  It showed mild cerebral atrophy and inflammatory changes in the paranasal sinuses.  The patient was given 2 L bolus of IV lactated Ringer, 20 mcg IV potassium chloride, IV cefepime and vancomycin, 50 mcg of IV fentanyl and 1 g of calcium gluconate.  He will be admitted to a medical telemetry observation bed for further evaluation and management. 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    OCD (obsessive compulsive disorder)    Renal colic     PAST SURGICAL HISTORY:   Past Surgical History:  Procedure Laterality Date   CHOLECYSTECTOMY  2012   COLONOSCOPY     COLONOSCOPY N/A 12/24/2022   Procedure: COLONOSCOPY;  Surgeon: Regis Bill, MD;  Location: [facility] ENDOSCOPY;  Service: Endoscopy;  Laterality: N/A;   COLONOSCOPY WITH PROPOFOL N/A 05/28/2020   Procedure: COLONOSCOPY WITH PROPOFOL;  Surgeon: Wyline Mood, MD;  Location: [facility] ENDOSCOPY;  Service: Gastroenterology;  Laterality: N/A;    ESOPHAGOGASTRODUODENOSCOPY N/A 12/24/2022   Procedure: ESOPHAGOGASTRODUODENOSCOPY (EGD);  Surgeon: Regis Bill, MD;  Location: [facility] ENDOSCOPY;  Service: Endoscopy;  Laterality: N/A;   EXTRACORPOREAL SHOCK WAVE LITHOTRIPSY Left 01/12/2019   Procedure: EXTRACORPOREAL SHOCK WAVE LITHOTRIPSY (ESWL);  Surgeon: Sondra Come, MD;  Location: [facility] ORS;  Service: Urology;  Laterality: Left;   UPPER GI ENDOSCOPY      SOCIAL HISTORY:   Social History   Tobacco Use   Smoking status: Former    Types: Cigarettes    Quit date: 03/19/1981    Years since quitting: 42.1   Smokeless tobacco: Never  Substance Use Topics   Alcohol use: Not Currently    Alcohol/week: 24.0 standard drinks of alcohol    Types: 24 Shots of liquor per week    Comment: last used 09/2021    FAMILY HISTORY:   Family History  Problem Relation Age of Onset   Alcohol abuse Father    Breast cancer Mother 37   Anxiety disorder Brother    Other Maternal Grandmother        unknown medical history   Other Maternal Grandfather        unknow medical history   Other Paternal Grandmother        unknown medical history   Other Paternal Grandfather        unknown medical history   Healthy Brother     DRUG ALLERGIES:  No Known Allergies  REVIEW OF SYSTEMS:   ROS As per history of present illness. All pertinent systems were reviewed above. Constitutional, HEENT, cardiovascular, respiratory, GI, GU, musculoskeletal, neuro, psychiatric, endocrine, integumentary and hematologic systems were reviewed and are otherwise negative/unremarkable except for positive findings mentioned above in the HPI.   MEDICATIONS AT HOME:   Prior to Admission medications   Medication Sig Start Date End Date Taking? Authorizing Provider  albuterol (PROAIR HFA) 108 (90 Base) MCG/ACT inhaler Inhale 1-2 puffs into the lungs every 4 (four) hours as needed for wheezing or shortness of breath. 04/16/23   Jacky Kindle, FNP  allopurinol (ZYLOPRIM)  100 MG tablet Take 1 tablet (100 mg total) by mouth daily. 04/16/23 04/10/24  Jacky Kindle, FNP  azithromycin (ZITHROMAX) 500 MG tablet Take 1 tablet (500 mg total) by mouth daily. 04/16/23   Jacky Kindle, FNP  doxycycline (VIBRA-TABS) 100 MG tablet Take 1 tablet (100 mg total) by mouth 2 (two) times daily. 04/16/23   Jacky Kindle, FNP  fluticasone furoate-vilanterol (BREO ELLIPTA) 200-25 MCG/ACT AEPB Inhale one puff by mouth daily 04/16/23   Jacky Kindle, FNP  fluticasone-salmeterol (ADVAIR) 100-50 MCG/ACT AEPB Inhale 1 puff into the lungs 2 (two) times daily. 04/20/23   Jacky Kindle, FNP  lisinopril (ZESTRIL) 40 MG tablet TAKE ONE TABLET BY MOUTH ONCE EVERY DAY. 04/16/23   Jacky Kindle, FNP  metoprolol succinate (TOPROL XL) 25 MG 24 hr tablet Take 1 tablet (25 mg total) by mouth  daily. 04/16/23   Jacky Kindle, FNP  predniSONE (DELTASONE) 50 MG tablet Take 1 tablet (50 mg total) by mouth daily with breakfast. 04/16/23   Jacky Kindle, FNP  sertraline (ZOLOFT) 25 MG tablet Take 1 tablet (25 mg total) by mouth at bedtime. 01/20/23   Amin, Loura Halt, MD  sertraline (ZOLOFT) 50 MG tablet Take 1 tablet (50 mg total) by mouth daily. 04/16/23   Jacky Kindle, FNP  traZODone (DESYREL) 100 MG tablet Take 1 tablet (100 mg total) by mouth at bedtime as needed. 04/20/23   Jacky Kindle, FNP      VITAL SIGNS:  Blood pressure 128/76, pulse 73, temperature 98.4 F (36.9 C), temperature source Oral, resp. rate 14, SpO2 100 %.  PHYSICAL EXAMINATION:  Physical Exam  GENERAL:  62 y.o.-year-old Caucasian male patient lying in the bed with no acute distress.  He was mildly somnolent but arousable.  He was slow to answer questions. EYES: Pupils equal, round, reactive to light and accommodation. No scleral icterus. Extraocular muscles intact.  HEENT: Head atraumatic, normocephalic. Oropharynx and nasopharynx clear.  NECK:  Supple, no jugular venous distention. No thyroid enlargement, no tenderness.  LUNGS:  Normal breath sounds bilaterally, no wheezing, rales,rhonchi or crepitation. No use of accessory muscles of respiration.  CARDIOVASCULAR: Regular rate and rhythm, S1, S2 normal. No murmurs, rubs, or gallops.  ABDOMEN: Soft, nondistended, nontender. Bowel sounds present. No organomegaly or mass.  EXTREMITIES: No pedal edema, cyanosis, or clubbing.  NEUROLOGIC: Cranial nerves II through XII are intact. Muscle strength 5/5 in all extremities. Sensation intact. Gait not checked.  PSYCHIATRIC: The patient is alert and oriented x 3.  Normal affect and good eye contact. SKIN: No obvious rash, lesion, or ulcer.   LABORATORY PANEL:   CBC Recent Labs  Lab 04/28/23 1942  WBC 8.5  HGB 9.7*  HCT 30.7*  PLT 223   ------------------------------------------------------------------------------------------------------------------  Chemistries  Recent Labs  Lab 04/28/23 1942  NA 142  K 2.4*  CL 115*  CO2 20*  GLUCOSE 96  BUN 12  CREATININE 0.83  CALCIUM 6.3*  AST 10*  ALT 8  ALKPHOS 41  BILITOT 0.5   ------------------------------------------------------------------------------------------------------------------  Cardiac Enzymes No results for input(s): "TROPONINI" in the last 168 hours. ------------------------------------------------------------------------------------------------------------------  RADIOLOGY:  DG Chest Port 1 View  Result Date: 04/28/2023 CLINICAL DATA:  Questionable sepsis - evaluate for abnormality. Syncope. EXAM: PORTABLE CHEST 1 VIEW COMPARISON:  Chest radiograph 12/22/2022 FINDINGS: The cardiomediastinal silhouette is mildly accentuated by portable AP technique and low lung volumes. No confluent airspace opacity, overt pulmonary edema, sizable pleural effusion, or pneumothorax is identified. No acute osseous abnormality is seen. IMPRESSION: No active disease. Electronically Signed   By: Sebastian Ache M.D.   On: 04/28/2023 20:42   CT ABDOMEN PELVIS WO  CONTRAST  Result Date: 04/28/2023 CLINICAL DATA:  Syncope, flank pain EXAM: CT ABDOMEN AND PELVIS WITHOUT CONTRAST TECHNIQUE: Multidetector CT imaging of the abdomen and pelvis was performed following the standard protocol without IV contrast. RADIATION DOSE REDUCTION: This exam was performed according to the departmental dose-optimization program which includes automated exposure control, adjustment of the mA and/or kV according to patient size and/or use of iterative reconstruction technique. COMPARISON:  12/23/2022 FINDINGS: Lower chest: Breathing motion limits evaluation of lower lung fields. Heart is enlarged in size. Hepatobiliary: No focal abnormalities are seen in liver. Gallbladder is not seen. There is no significant dilation of bile ducts. Pancreas: No focal abnormalities are seen. Diverticulum is noted along  the inner margin of duodenum. Spleen: Unremarkable. Adrenals/Urinary Tract: Adrenals are unremarkable. There is mild right hydronephrosis. There is a 9 mm calculus in proximal right ureter at the L3 level. There is 7 mm calculus in the lower pole of right kidney. There is 3 mm calculus in the lower pole of left kidney. Distal ureters are not dilated. Urinary bladder is unremarkable. Stomach/Bowel: Stomach is unremarkable. There is mild dilation of proximal small bowel loops, possibly ileus. Distal small bowel loops are not dilated. Appendix he has not dilated. There is fat attenuation in submucosal location in right colon. This finding has not changed. There is no focal wall thickening in colon. There is no pericolic stranding. Scattered diverticula seen in colon without signs of focal diverticulitis. Vascular/Lymphatic: Scattered arterial calcifications are seen. Reproductive: There are coarse calcifications in prostate. Prostate is enlarged. Other: There is no ascites or pneumoperitoneum. Small umbilical hernia containing fat is seen. Small left inguinal hernia containing fat is seen.  Musculoskeletal: No acute findings are seen. IMPRESSION: There is 9 mm calculus in the proximal course of right ureter at the L3 level causing mild right hydronephrosis. Small bilateral renal stones. There is no evidence of intestinal obstruction or pneumoperitoneum. Appendix is not dilated. There is decreased density in submucosal region in right colon suggesting possible chronic colitis. Diverticula are seen in colon without signs of focal acute diverticulitis. Enlarged prostate. Aortic arteriosclerosis. Other findings as described in the body of the report. Electronically Signed   By: Ernie Avena M.D.   On: 04/28/2023 20:38   CT Head Wo Contrast  Result Date: 04/28/2023 CLINICAL DATA:  Mental status change of unknown cause. Syncopal episode with fall 2 days ago. EXAM: CT HEAD WITHOUT CONTRAST TECHNIQUE: Contiguous axial images were obtained from the base of the skull through the vertex without intravenous contrast. RADIATION DOSE REDUCTION: This exam was performed according to the departmental dose-optimization program which includes automated exposure control, adjustment of the mA and/or kV according to patient size and/or use of iterative reconstruction technique. COMPARISON:  08/01/2021 and 08/04/2021 FINDINGS: Brain: No evidence of acute infarction, hemorrhage, hydrocephalus, extra-axial collection or mass lesion/mass effect. Mild cerebral atrophy. Vascular: No hyperdense vessel or unexpected calcification. Skull: Normal. Negative for fracture or focal lesion. Sinuses/Orbits: Mucosal thickening in the paranasal sinuses, progressing since prior studies. This is likely inflammatory. No acute air-fluid levels. Mastoid air cells are clear. Other: None. IMPRESSION: No acute intracranial abnormalities. Mild cerebral atrophy. Inflammatory changes in the paranasal sinuses. Electronically Signed   By: Burman Nieves M.D.   On: 04/28/2023 20:33      IMPRESSION AND PLAN:  Assessment and Plan: *  Syncope - The patient will be admitted to an observation medically monitored bed. - Will check orthostatics q 12 hours. - Will obtain a bilateral carotid Doppler and 2D echo. - The patient will be gently hydrated with IV normal saline and monitored for arrhythmias. -Differential diagnoses would include neurally mediated syncope, cardiogenic, arrhythmias related,  orthostatic hypotension and less likely hypoglycemia. - The patient had original concern about sepsis and therefore was given empiric IV antibiotics.  Urinalysis and chest x-ray came back negative and blood cultures were pending.  I am holding off any further antibiotics at this time.    Alcoholic ketoacidosis - This is associated with alcohol intoxication. - This is early and mild and is with associated lactic acidosis. - We will place him on hydration with IV normal saline and follow his BMP. - We will follow his lactic  acid  Hypokalemia - Potassium will be replaced and magnesium level will be checked.  Hypocalcemia - Calcium was replaced and will be continued.  Alcohol abuse - She was counseled for cessation. - He will be monitored for alcohol withdrawal. - We will check alcohol level.  Depression - We will continue Zoloft and trazodone.  Essential hypertension - We will continue his antihypertensives.  Gout - We will continue allopurinol.    DVT prophylaxis: Lovenox. Advanced Care Planning:  Code Status: full code. Family Communication:  The plan of care was discussed in details with the patient (and family). I answered all questions. The patient agreed to proceed with the above mentioned plan. Further management will depend upon hospital course. Disposition Plan: Back to previous home environment Consults called: none. All the records are reviewed and case discussed with ED provider.  Status is: Observation  I certify that at the time of admission, it is my clinical judgment that the patient will require  hospital care extending less than 2 midnights.                            Dispo: The patient is from: Home              Anticipated d/c is to: Home              Patient currently is not medically stable to d/c.              Difficult to place patient: No  Hannah Beat M.D on 04/29/2023 at 1:46 AM  [facility]   From 7 PM-7 AM, contact night-coverage [email or web address]  CC: Primary care physician; Jacky Kindle, FNP

## 2023-04-29 NOTE — ED Notes (Signed)
MD at bedside placing orders at present

## 2023-04-29 NOTE — Assessment & Plan Note (Addendum)
-   The patient will be admitted to an observation medically monitored bed. - Will check orthostatics q 12 hours. - Will obtain a bilateral carotid Doppler and 2D echo. - The patient will be gently hydrated with IV normal saline and monitored for arrhythmias. -Differential diagnoses would include neurally mediated syncope, cardiogenic, arrhythmias related,  orthostatic hypotension and less likely hypoglycemia. - The patient had original concern about sepsis and therefore was given empiric IV antibiotics.  Urinalysis and chest x-ray came back negative and blood cultures were pending.  I am holding off any further antibiotics at this time.

## 2023-04-29 NOTE — Progress Notes (Signed)
*  PRELIMINARY RESULTS* Echocardiogram 2D Echocardiogram has been performed.  Miguel [name] 04/29/2023, 9:31 AM

## 2023-04-29 NOTE — Assessment & Plan Note (Signed)
-   She was counseled for cessation. - He will be monitored for alcohol withdrawal. - We will check alcohol level.

## 2023-04-29 NOTE — Assessment & Plan Note (Addendum)
-   This is associated with alcohol intoxication. - This is early and mild and is with associated lactic acidosis. - We will place him on hydration with IV normal saline and follow his BMP. - We will follow his lactic acid

## 2023-04-29 NOTE — Progress Notes (Signed)
Pt OFF via w/c in stable condition

## 2023-04-29 NOTE — Assessment & Plan Note (Signed)
-   We will continue allopurinol 

## 2023-04-29 NOTE — Assessment & Plan Note (Signed)
-   We will continue his antihypertensives. 

## 2023-04-29 NOTE — Assessment & Plan Note (Signed)
-   Potassium will be replaced and magnesium level will be checked. ?

## 2023-04-29 NOTE — Discharge Summary (Signed)
Physician Discharge Summary  Miguel [name] ONG:[id number] DOB: 1961-04-16 DOA: 04/28/2023  PCP: Jacky Kindle, FNP  Admit date: 04/28/2023 Discharge date: 04/29/2023  Admitted From: Home Disposition:  Home  Recommendations for Outpatient Follow-up:  Follow up with PCP in 1-2 weeks   Home Health:No Equipment/Devices:None   Discharge Condition:Stable  CODE STATUS:FULL  Diet recommendation: Reg  Brief/Interim Summary:   62 y.o. male with medical history significant for anxiety, asthma, GERD, alcohol abuse, gout, hypertension, urolithiasis and OCD, who presented to the ER with acute onset of syncope and altered mental status.  He passed out in a chair before EMS was called.  Upon EMS arrival the patient was confused and intermittently agitated.  He was given Haldol and Versed after which he became more lethargic and was hypotensive.  His last alcoholic drink was last night.  He admitted to drinking 3 shots then.  No nausea or vomiting or diarrhea.  No chest pain or palpitations.  No cough or wheezing or dyspnea.  He had mild dysuria without urinary frequency or urgency or flank pain.  No bleeding diathesis. The patient was recently admitted to the ER and discharged on 5/22 after having an self-inflicted cut on his left wrist while he was alcohol intoxicated.  He currently denies any suicidal thoughts.  Patient was monitored on IV fluids.  At the time of discharge patient is mentating clearly.  He is able to ambulate without difficulty.  Labs have improved.  Echocardiogram performed.  Results pending at time of this note.  No indication for continued inpatient stay.  Recommend outpatient follow-up.  Discussed substance use cessation with patient.     Discharge Diagnoses:  Principal Problem:   Syncope Active Problems:   Alcoholic ketoacidosis   Hypokalemia   Hypocalcemia   Alcohol abuse   Gout   Essential hypertension   Depression  Patient was found down in his chair after admitted  consumption of multiple alcoholic beverages.  EtOH 344 on arrival.  Patient received Versed and Haldol which likely worsened his situation.  There is low suspicion for infectious etiology.  Antibiotics discontinued.  Alcoholic ketosis has resolved.  Lactic acidosis resolved.  Patient counseled on substance use cessation.  Stable for discharge home.  Discharge Instructions  Discharge Instructions     Diet - low sodium heart healthy   Complete by: As directed    Increase activity slowly   Complete by: As directed       Allergies as of 04/29/2023   No Known Allergies      Medication List     STOP taking these medications    azithromycin 500 MG tablet Commonly known as: Zithromax   doxycycline 100 MG tablet Commonly known as: VIBRA-TABS   fluticasone furoate-vilanterol 200-25 MCG/ACT Aepb Commonly known as: BREO ELLIPTA   predniSONE 50 MG tablet Commonly known as: DELTASONE       TAKE these medications    albuterol 108 (90 Base) MCG/ACT inhaler Commonly known as: ProAir HFA Inhale 1-2 puffs into the lungs every 4 (four) hours as needed for wheezing or shortness of breath.   allopurinol 100 MG tablet Commonly known as: Zyloprim Take 1 tablet (100 mg total) by mouth daily.   fluticasone-salmeterol 100-50 MCG/ACT Aepb Commonly known as: ADVAIR Inhale 1 puff into the lungs 2 (two) times daily.   lisinopril 40 MG tablet Commonly known as: ZESTRIL TAKE ONE TABLET BY MOUTH ONCE EVERY DAY.   metoprolol succinate 25 MG 24 hr tablet Commonly known as: Toprol  XL Take 1 tablet (25 mg total) by mouth daily.   sertraline 50 MG tablet Commonly known as: ZOLOFT Take 1 tablet (50 mg total) by mouth daily. What changed: Another medication with the same name was removed. Continue taking this medication, and follow the directions you see here.   traZODone 100 MG tablet Commonly known as: DESYREL Take 1 tablet (100 mg total) by mouth at bedtime as needed.        No  Known Allergies  Consultations: None   Procedures/Studies: DG Chest Port 1 View  Result Date: 04/28/2023 CLINICAL DATA:  Questionable sepsis - evaluate for abnormality. Syncope. EXAM: PORTABLE CHEST 1 VIEW COMPARISON:  Chest radiograph 12/22/2022 FINDINGS: The cardiomediastinal silhouette is mildly accentuated by portable AP technique and low lung volumes. No confluent airspace opacity, overt pulmonary edema, sizable pleural effusion, or pneumothorax is identified. No acute osseous abnormality is seen. IMPRESSION: No active disease. Electronically Signed   By: Sebastian Ache M.D.   On: 04/28/2023 20:42   CT ABDOMEN PELVIS WO CONTRAST  Result Date: 04/28/2023 CLINICAL DATA:  Syncope, flank pain EXAM: CT ABDOMEN AND PELVIS WITHOUT CONTRAST TECHNIQUE: Multidetector CT imaging of the abdomen and pelvis was performed following the standard protocol without IV contrast. RADIATION DOSE REDUCTION: This exam was performed according to the departmental dose-optimization program which includes automated exposure control, adjustment of the mA and/or kV according to patient size and/or use of iterative reconstruction technique. COMPARISON:  12/23/2022 FINDINGS: Lower chest: Breathing motion limits evaluation of lower lung fields. Heart is enlarged in size. Hepatobiliary: No focal abnormalities are seen in liver. Gallbladder is not seen. There is no significant dilation of bile ducts. Pancreas: No focal abnormalities are seen. Diverticulum is noted along the inner margin of duodenum. Spleen: Unremarkable. Adrenals/Urinary Tract: Adrenals are unremarkable. There is mild right hydronephrosis. There is a 9 mm calculus in proximal right ureter at the L3 level. There is 7 mm calculus in the lower pole of right kidney. There is 3 mm calculus in the lower pole of left kidney. Distal ureters are not dilated. Urinary bladder is unremarkable. Stomach/Bowel: Stomach is unremarkable. There is mild dilation of proximal small bowel  loops, possibly ileus. Distal small bowel loops are not dilated. Appendix he has not dilated. There is fat attenuation in submucosal location in right colon. This finding has not changed. There is no focal wall thickening in colon. There is no pericolic stranding. Scattered diverticula seen in colon without signs of focal diverticulitis. Vascular/Lymphatic: Scattered arterial calcifications are seen. Reproductive: There are coarse calcifications in prostate. Prostate is enlarged. Other: There is no ascites or pneumoperitoneum. Small umbilical hernia containing fat is seen. Small left inguinal hernia containing fat is seen. Musculoskeletal: No acute findings are seen. IMPRESSION: There is 9 mm calculus in the proximal course of right ureter at the L3 level causing mild right hydronephrosis. Small bilateral renal stones. There is no evidence of intestinal obstruction or pneumoperitoneum. Appendix is not dilated. There is decreased density in submucosal region in right colon suggesting possible chronic colitis. Diverticula are seen in colon without signs of focal acute diverticulitis. Enlarged prostate. Aortic arteriosclerosis. Other findings as described in the body of the report. Electronically Signed   By: Ernie Avena M.D.   On: 04/28/2023 20:38   CT Head Wo Contrast  Result Date: 04/28/2023 CLINICAL DATA:  Mental status change of unknown cause. Syncopal episode with fall 2 days ago. EXAM: CT HEAD WITHOUT CONTRAST TECHNIQUE: Contiguous axial images were obtained from the  base of the skull through the vertex without intravenous contrast. RADIATION DOSE REDUCTION: This exam was performed according to the departmental dose-optimization program which includes automated exposure control, adjustment of the mA and/or kV according to patient size and/or use of iterative reconstruction technique. COMPARISON:  08/01/2021 and 08/04/2021 FINDINGS: Brain: No evidence of acute infarction, hemorrhage, hydrocephalus,  extra-axial collection or mass lesion/mass effect. Mild cerebral atrophy. Vascular: No hyperdense vessel or unexpected calcification. Skull: Normal. Negative for fracture or focal lesion. Sinuses/Orbits: Mucosal thickening in the paranasal sinuses, progressing since prior studies. This is likely inflammatory. No acute air-fluid levels. Mastoid air cells are clear. Other: None. IMPRESSION: No acute intracranial abnormalities. Mild cerebral atrophy. Inflammatory changes in the paranasal sinuses. Electronically Signed   By: Burman Nieves M.D.   On: 04/28/2023 20:33      Subjective: Seen and examined on the day of discharge.  Stable no distress.  Appropriate for discharge home.  Discharge Exam: Vitals:   04/29/23 0445 04/29/23 0747  BP: 95/63 114/84  Pulse: (!) 58 78  Resp: 19 18  Temp: (!) 97.5 F (36.4 C) 98.2 F (36.8 C)  SpO2: 96% 97%   Vitals:   04/29/23 0210 04/29/23 0212 04/29/23 0445 04/29/23 0747  BP: (!) 146/96 128/84 95/63 114/84  Pulse: 73 (!) 58 (!) 58 78  Resp: 18  19 18   Temp:   (!) 97.5 F (36.4 C) 98.2 F (36.8 C)  TempSrc:      SpO2: 100% 97% 96% 97%    General: Pt is alert, awake, not in acute distress Cardiovascular: RRR, S1/S2 +, no rubs, no gallops Respiratory: CTA bilaterally, no wheezing, no rhonchi Abdominal: Soft, NT, ND, bowel sounds + Extremities: no edema, no cyanosis    The results of significant diagnostics from this hospitalization (including imaging, microbiology, ancillary and laboratory) are listed below for reference.     Microbiology: No results found for this or any previous visit (from the past 240 hour(s)).   Labs: BNP (last 3 results) No results for input(s): "BNP" in the last 8760 hours. Basic Metabolic Panel: Recent Labs  Lab 04/28/23 1942 04/29/23 0418  NA 142 144  K 2.4* 3.9  CL 115* 113*  CO2 20* 21*  GLUCOSE 96 89  BUN 12 12  CREATININE 0.83 0.85  CALCIUM 6.3* 7.9*   Liver Function Tests: Recent Labs  Lab  04/28/23 1942  AST 10*  ALT 8  ALKPHOS 41  BILITOT 0.5  PROT 4.2*  ALBUMIN 2.3*   No results for input(s): "LIPASE", "AMYLASE" in the last 168 hours. No results for input(s): "AMMONIA" in the last 168 hours. CBC: Recent Labs  Lab 04/28/23 1942 04/29/23 0418  WBC 8.5 5.9  NEUTROABS 5.1  --   HGB 9.7* 9.7*  HCT 30.7* 31.1*  MCV 93.0 94.0  PLT 223 201   Cardiac Enzymes: No results for input(s): "CKTOTAL", "CKMB", "CKMBINDEX", "TROPONINI" in the last 168 hours. BNP: Invalid input(s): "POCBNP" CBG: No results for input(s): "GLUCAP" in the last 168 hours. D-Dimer No results for input(s): "DDIMER" in the last 72 hours. Hgb A1c No results for input(s): "HGBA1C" in the last 72 hours. Lipid Profile No results for input(s): "CHOL", "HDL", "LDLCALC", "TRIG", "CHOLHDL", "LDLDIRECT" in the last 72 hours. Thyroid function studies No results for input(s): "TSH", "T4TOTAL", "T3FREE", "THYROIDAB" in the last 72 hours.  Invalid input(s): "FREET3" Anemia work up No results for input(s): "VITAMINB12", "FOLATE", "FERRITIN", "TIBC", "IRON", "RETICCTPCT" in the last 72 hours. Urinalysis    Component  Value Date/Time   COLORURINE COLORLESS (A) 04/28/2023 2200   APPEARANCEUR CLEAR (A) 04/28/2023 2200   APPEARANCEUR Hazy 06/26/2014 2238   LABSPEC 1.002 (L) 04/28/2023 2200   LABSPEC 1.036 06/26/2014 2238   PHURINE 7.0 04/28/2023 2200   GLUCOSEU NEGATIVE 04/28/2023 2200   GLUCOSEU Negative 06/26/2014 2238   HGBUR SMALL (A) 04/28/2023 2200   BILIRUBINUR NEGATIVE 04/28/2023 2200   BILIRUBINUR 1+ 06/26/2014 2238   KETONESUR NEGATIVE 04/28/2023 2200   PROTEINUR NEGATIVE 04/28/2023 2200   NITRITE NEGATIVE 04/28/2023 2200   LEUKOCYTESUR NEGATIVE 04/28/2023 2200   LEUKOCYTESUR 1+ 06/26/2014 2238   Sepsis Labs Recent Labs  Lab 04/28/23 1942 04/29/23 0418  WBC 8.5 5.9   Microbiology No results found for this or any previous visit (from the past 240 hour(s)).   Time coordinating  discharge: Over 30 minutes  SIGNED:   Tresa Moore, MD  [facility] 04/29/2023, 11:02 AM Pager   If 7PM-7AM, please contact night-coverage

## 2023-04-29 NOTE — Progress Notes (Signed)
Mobility Specialist - Progress Note   04/29/23 0939  Mobility  Activity Ambulated independently in room;Ambulated independently to bathroom;Stood at bedside;Dangled on edge of bed  Level of Assistance Independent  Assistive Device None  Distance Ambulated (ft) 15 ft  Activity Response Tolerated well  Mobility Referral Yes  $Mobility charge 1 Mobility  Mobility Specialist Start Time (ACUTE ONLY) [id number]  Mobility Specialist Stop Time (ACUTE ONLY) 0925  Mobility Specialist Time Calculation (min) (ACUTE ONLY) 9 min   Pt OOB in bathroom on RA upon arrival. Pt ambulates from bathroom back to bed indep. Pt left EOB with needs in reach.   Terrilyn Saver  Mobility Specialist  04/29/23 9:41 AM

## 2023-04-29 NOTE — Assessment & Plan Note (Signed)
-   Calcium was replaced and will be continued.

## 2023-04-30 ENCOUNTER — Telehealth: Payer: Self-pay

## 2023-04-30 LAB — CULTURE, BLOOD (ROUTINE X 2): Special Requests: ADEQUATE

## 2023-04-30 NOTE — Transitions of Care (Post Inpatient/ED Visit) (Signed)
   04/30/2023  Name: Miguel [name] MRN: [id number] DOB: [birth year]  Today's TOC FU Call Status: Today's TOC FU Call Status:: Successful TOC FU Call Competed TOC FU Call Complete Date: 04/30/23  Transition Care Management Follow-up Telephone Call Date of Discharge: 04/29/23 Discharge Facility: [facility]) Type of Discharge: Inpatient Admission Primary Inpatient Discharge Diagnosis:: altered mental status How have you been since you were released from the hospital?: Better Any questions or concerns?: No  Items Reviewed: Did you receive and understand the discharge instructions provided?: Yes Medications obtained,verified, and reconciled?: Yes (Medications Reviewed) Any new allergies since your discharge?: No Dietary orders reviewed?: Yes Do you have support at home?: Yes People in Home: spouse  Medications Reviewed Today: Medications Reviewed Today     Reviewed by Karena Addison, LPN (Licensed Practical Nurse) on 04/30/23 at 0845  Med List Status: <None>   Medication Order Taking? Sig Documenting Provider Last Dose Status Informant  albuterol (PROAIR HFA) 108 (90 Base) MCG/ACT inhaler [id number] No Inhale 1-2 puffs into the lungs every 4 (four) hours as needed for wheezing or shortness of breath. Jacky Kindle, FNP prn Active Pharmacy Records  allopurinol (ZYLOPRIM) 100 MG tablet [id number] No Take 1 tablet (100 mg total) by mouth daily. Jacky Kindle, FNP unknown Active Pharmacy Records  fluticasone-salmeterol (ADVAIR) 100-50 MCG/ACT AEPB [id number] No Inhale 1 puff into the lungs 2 (two) times daily. Jacky Kindle, FNP unknown Active Pharmacy Records  lisinopril (ZESTRIL) 40 MG tablet [id number] No TAKE ONE TABLET BY MOUTH ONCE EVERY DAY. Jacky Kindle, FNP unknown Active Pharmacy Records  metoprolol succinate (TOPROL XL) 25 MG 24 hr tablet [id number] No Take 1 tablet (25 mg total) by mouth daily. Jacky Kindle, FNP unknown Active Pharmacy Records   sertraline (ZOLOFT) 50 MG tablet [id number] No Take 1 tablet (50 mg total) by mouth daily. Jacky Kindle, FNP unknown Active Pharmacy Records  traZODone (DESYREL) 100 MG tablet [id number] No Take 1 tablet (100 mg total) by mouth at bedtime as needed. Jacky Kindle, FNP unknown Active Pharmacy Records            Home Care and Equipment/Supplies: Were Home Health Services Ordered?: NA Any new equipment or medical supplies ordered?: NA  Functional Questionnaire: Do you need assistance with bathing/showering or dressing?: No Do you need assistance with meal preparation?: No Do you need assistance with eating?: No Do you have difficulty maintaining continence: No Do you need assistance with getting out of bed/getting out of a chair/moving?: No Do you have difficulty managing or taking your medications?: No  Follow up appointments reviewed: PCP Follow-up appointment confirmed?: Yes Date of PCP follow-up appointment?: 05/05/23 Follow-up Provider: [facility] Follow-up appointment confirmed?: NA Do you need transportation to your follow-up appointment?: No Do you understand care options if your condition(s) worsen?: Yes-patient verbalized understanding    SIGNATURE Karena Addison, LPN [facility] Nurse Health Advisor Direct Dial [phone]

## 2023-05-01 LAB — CULTURE, BLOOD (ROUTINE X 2)

## 2023-05-02 LAB — CULTURE, BLOOD (ROUTINE X 2): Culture: NO GROWTH

## 2023-05-03 LAB — CULTURE, BLOOD (ROUTINE X 2): Culture: NO GROWTH

## 2023-05-05 ENCOUNTER — Ambulatory Visit (INDEPENDENT_AMBULATORY_CARE_PROVIDER_SITE_OTHER): Payer: Medicaid Other | Admitting: Family Medicine

## 2023-05-05 ENCOUNTER — Emergency Department
Admission: EM | Admit: 2023-05-05 | Discharge: 2023-05-06 | Disposition: A | Discharge status: Home / Self Care | Payer: No Typology Code available for payment source | Attending: Emergency Medicine | Admitting: Emergency Medicine

## 2023-05-05 ENCOUNTER — Emergency Department: Payer: No Typology Code available for payment source

## 2023-05-05 DIAGNOSIS — Z91199 Patient's noncompliance with other medical treatment and regimen due to unspecified reason: Secondary | ICD-10-CM

## 2023-05-05 DIAGNOSIS — M79661 Pain in right lower leg: Secondary | ICD-10-CM | POA: Diagnosis not present

## 2023-05-05 DIAGNOSIS — M79662 Pain in left lower leg: Secondary | ICD-10-CM | POA: Insufficient documentation

## 2023-05-05 DIAGNOSIS — R296 Repeated falls: Secondary | ICD-10-CM | POA: Insufficient documentation

## 2023-05-05 DIAGNOSIS — F10220 Alcohol dependence with intoxication, uncomplicated: Secondary | ICD-10-CM | POA: Diagnosis present

## 2023-05-05 DIAGNOSIS — F10920 Alcohol use, unspecified with intoxication, uncomplicated: Secondary | ICD-10-CM

## 2023-05-05 DIAGNOSIS — M545 Low back pain, unspecified: Secondary | ICD-10-CM | POA: Diagnosis not present

## 2023-05-05 MED ORDER — DROPERIDOL 2.5 MG/ML IJ SOLN
2.5000 mg | Freq: Once | INTRAMUSCULAR | Status: AC
  Administered 2023-05-05: 2.5 mg via INTRAMUSCULAR
  Filled 2023-05-05: qty 2

## 2023-05-05 NOTE — ED Provider Notes (Signed)
   [facility] Provider Note   Event Date/Time   First MD Initiated Contact with Patient 05/05/23 2005     (approximate) History  Alcohol Intoxication  HPI Miguel [name] is a 62 y.o. male with a past medical history of alcohol abuse who presents via law enforcement with complaints of multiple falls, low back pain, and bilateral lower extremity pain.  Patient states that he is very anxious and is not answering any further questions. ROS: Unable to assess   Physical Exam  Triage Vital Signs: ED Triage Vitals  Enc Vitals Group     BP 05/05/23 2009 130/86     Pulse Rate 05/05/23 2009 80     Resp 05/05/23 2009 20     Temp 05/05/23 2009 98.4 F (36.9 C)     Temp Source 05/05/23 2009 Oral     SpO2 05/05/23 2009 100 %     Weight --      Height --      Head Circumference --      Peak Flow --      Pain Score 05/05/23 2010 0     Pain Loc --      Pain Edu? --      Excl. in GC? --    Most recent vital signs: Vitals:   05/05/23 2009 05/05/23 2331  BP: 130/86 115/69  Pulse: 80 82  Resp: 20 14  Temp: 98.4 F (36.9 C)   SpO2: 100% 97%   General: Awake CV:  Good peripheral perfusion.  Resp:  Normal effort.  Abd:  No distention.  Other:  Middle-aged overweight Caucasian male laying in bed in no acute distress.  Mildly slurred speech consistent with alcohol intoxication ED Results / Procedures / Treatments  Labs (all labs ordered are listed, but only abnormal results are displayed) Labs Reviewed - No data to display  PROCEDURES: Critical Care performed: No Procedures MEDICATIONS ORDERED IN ED: Medications  droperidol (INAPSINE) 2.5 MG/ML injection 2.5 mg (2.5 mg Intramuscular Given 05/05/23 2214)   IMPRESSION / MDM / ASSESSMENT AND PLAN / ED COURSE  I reviewed the triage vital signs and the nursing notes.                             The patient is on the cardiac monitor to evaluate for evidence of arrhythmia and/or significant heart rate  changes. Patient's presentation is most consistent with acute presentation with potential threat to life or bodily function. Presents with altered mental status. +Slurred, sluggish behavior. Stated EtOH intoxication. Airway maintained. Unlikely intracranial bleed, opioid intoxication or coingestion, sepsis, hypothyroidism. Suspect likely transient course of intoxication with expected  improvement of symptoms as patient metabolizes offending agent.  Plan: frequent reassessments  Reassessment Note: Care of this patient will be signed out to the oncoming physician at the end of my shift.  All pertinent patient information conveyed and all questions answered.  All further care and disposition decisions will be made by the oncoming physician.    FINAL CLINICAL IMPRESSION(S) / ED DIAGNOSES   Final diagnoses:  Alcoholic intoxication without complication (HCC)   Rx / DC Orders   ED Discharge Orders     None      Note:  This document was prepared using Dragon voice recognition software and may include unintentional dictation errors.   Merwyn Katos, MD 05/05/23 [phone]

## 2023-05-05 NOTE — Progress Notes (Signed)
Patient was not seen for appt d/t no call, no show, or late arrival >10 mins past appt time.   Cortni Tays T Schon Zeiders, FNP  [facility] [address] [place], [place] [address] [phone] (phone) [phone] (fax) [facility]  

## 2023-05-05 NOTE — ED Notes (Addendum)
Pt getting agitated, punching wall and hitting self. Pt was told to stop. Pt continues clenching fists and teeth. MD aware

## 2023-05-05 NOTE — ED Triage Notes (Signed)
Pt to ED via [facility] c/o alcohol intoxication and multiple falls. Pt admits to drinking 2 pints of vodka, ems states there were 3 bottles arounf him. Pt was complaining of L. Leg pain with EMS, Pt not complaining of any pain at this time. Unknown if pt hit his head or LOC. Unknown if pt is on blood thinners. Denies CP, SOB, N/V

## 2023-05-05 NOTE — ED Notes (Signed)
Pt taken to CT.

## 2023-05-06 MED ORDER — SALINE SPRAY 0.65 % NA SOLN
1.0000 | Freq: Once | NASAL | Status: AC
  Administered 2023-05-06: 1 via NASAL
  Filled 2023-05-06: qty 44

## 2023-05-06 NOTE — ED Provider Notes (Addendum)
-----------------------------------------   6:24 AM on 05/06/2023 -----------------------------------------   No events overnight.  Patient was up and down ambulating to the restroom with steady gait.  Saline nasal spray administered per patient's request of dry nostrils.  He is currently drinking some water.  He plans to take a taxi home.  Strict return precautions given.  Patient verbalizes understanding and agrees with plan of care.    Irean Hong, MD 05/06/23 7829    Irean Hong, MD 05/06/23 [phone]

## 2023-05-06 NOTE — Discharge Instructions (Signed)
Drink alcohol only in moderation.  Return to the ER for worsening symptoms or other concerns. 

## 2023-05-06 NOTE — ED Notes (Signed)
Pt up to use restroom.

## 2023-05-24 ENCOUNTER — Inpatient Hospital Stay
Admission: EM | Admit: 2023-05-24 | Discharge: 2023-06-01 | DRG: 897 | Disposition: A | Discharge status: Home / Self Care | Payer: MEDICAID | Attending: Internal Medicine | Admitting: Internal Medicine

## 2023-05-24 ENCOUNTER — Other Ambulatory Visit: Payer: Self-pay

## 2023-05-24 ENCOUNTER — Inpatient Hospital Stay: Payer: MEDICAID

## 2023-05-24 ENCOUNTER — Emergency Department: Payer: MEDICAID

## 2023-05-24 DIAGNOSIS — N179 Acute kidney failure, unspecified: Secondary | ICD-10-CM | POA: Diagnosis present

## 2023-05-24 DIAGNOSIS — D509 Iron deficiency anemia, unspecified: Secondary | ICD-10-CM | POA: Diagnosis present

## 2023-05-24 DIAGNOSIS — Z7141 Alcohol abuse counseling and surveillance of alcoholic: Secondary | ICD-10-CM | POA: Diagnosis not present

## 2023-05-24 DIAGNOSIS — T148XXA Other injury of unspecified body region, initial encounter: Secondary | ICD-10-CM | POA: Insufficient documentation

## 2023-05-24 DIAGNOSIS — W19XXXA Unspecified fall, initial encounter: Secondary | ICD-10-CM | POA: Diagnosis present

## 2023-05-24 DIAGNOSIS — Z7951 Long term (current) use of inhaled steroids: Secondary | ICD-10-CM

## 2023-05-24 DIAGNOSIS — Z87442 Personal history of urinary calculi: Secondary | ICD-10-CM

## 2023-05-24 DIAGNOSIS — K219 Gastro-esophageal reflux disease without esophagitis: Secondary | ICD-10-CM | POA: Diagnosis present

## 2023-05-24 DIAGNOSIS — R54 Age-related physical debility: Secondary | ICD-10-CM | POA: Diagnosis present

## 2023-05-24 DIAGNOSIS — M109 Gout, unspecified: Secondary | ICD-10-CM | POA: Diagnosis present

## 2023-05-24 DIAGNOSIS — F10231 Alcohol dependence with withdrawal delirium: Principal | ICD-10-CM | POA: Diagnosis present

## 2023-05-24 DIAGNOSIS — F1010 Alcohol abuse, uncomplicated: Secondary | ICD-10-CM | POA: Diagnosis present

## 2023-05-24 DIAGNOSIS — S300XXA Contusion of lower back and pelvis, initial encounter: Secondary | ICD-10-CM | POA: Diagnosis present

## 2023-05-24 DIAGNOSIS — Z86010 Personal history of colonic polyps: Secondary | ICD-10-CM | POA: Diagnosis not present

## 2023-05-24 DIAGNOSIS — Z79899 Other long term (current) drug therapy: Secondary | ICD-10-CM

## 2023-05-24 DIAGNOSIS — R17 Unspecified jaundice: Secondary | ICD-10-CM

## 2023-05-24 DIAGNOSIS — Z818 Family history of other mental and behavioral disorders: Secondary | ICD-10-CM

## 2023-05-24 DIAGNOSIS — E611 Iron deficiency: Secondary | ICD-10-CM | POA: Diagnosis present

## 2023-05-24 DIAGNOSIS — I10 Essential (primary) hypertension: Secondary | ICD-10-CM | POA: Diagnosis present

## 2023-05-24 DIAGNOSIS — Z043 Encounter for examination and observation following other accident: Secondary | ICD-10-CM | POA: Diagnosis present

## 2023-05-24 DIAGNOSIS — Z5321 Procedure and treatment not carried out due to patient leaving prior to being seen by health care provider: Secondary | ICD-10-CM | POA: Diagnosis not present

## 2023-05-24 DIAGNOSIS — K7030 Alcoholic cirrhosis of liver without ascites: Secondary | ICD-10-CM | POA: Diagnosis present

## 2023-05-24 DIAGNOSIS — R000 Tachycardia, unspecified: Secondary | ICD-10-CM | POA: Diagnosis present

## 2023-05-24 DIAGNOSIS — I951 Orthostatic hypotension: Secondary | ICD-10-CM | POA: Diagnosis present

## 2023-05-24 DIAGNOSIS — R197 Diarrhea, unspecified: Secondary | ICD-10-CM | POA: Diagnosis present

## 2023-05-24 DIAGNOSIS — Z87891 Personal history of nicotine dependence: Secondary | ICD-10-CM | POA: Diagnosis not present

## 2023-05-24 DIAGNOSIS — S7011XA Contusion of right thigh, initial encounter: Secondary | ICD-10-CM | POA: Diagnosis present

## 2023-05-24 DIAGNOSIS — J45909 Unspecified asthma, uncomplicated: Secondary | ICD-10-CM | POA: Diagnosis present

## 2023-05-24 DIAGNOSIS — F411 Generalized anxiety disorder: Secondary | ICD-10-CM | POA: Diagnosis present

## 2023-05-24 DIAGNOSIS — F429 Obsessive-compulsive disorder, unspecified: Secondary | ICD-10-CM | POA: Diagnosis present

## 2023-05-24 DIAGNOSIS — Z811 Family history of alcohol abuse and dependence: Secondary | ICD-10-CM

## 2023-05-24 DIAGNOSIS — F10930 Alcohol use, unspecified with withdrawal, uncomplicated: Secondary | ICD-10-CM | POA: Diagnosis not present

## 2023-05-24 DIAGNOSIS — K921 Melena: Secondary | ICD-10-CM | POA: Diagnosis present

## 2023-05-24 DIAGNOSIS — F32A Depression, unspecified: Secondary | ICD-10-CM | POA: Diagnosis present

## 2023-05-24 DIAGNOSIS — R079 Chest pain, unspecified: Secondary | ICD-10-CM | POA: Diagnosis present

## 2023-05-24 DIAGNOSIS — E782 Mixed hyperlipidemia: Secondary | ICD-10-CM | POA: Diagnosis present

## 2023-05-24 DIAGNOSIS — R296 Repeated falls: Secondary | ICD-10-CM | POA: Diagnosis present

## 2023-05-24 DIAGNOSIS — F10931 Alcohol use, unspecified with withdrawal delirium: Secondary | ICD-10-CM | POA: Diagnosis not present

## 2023-05-24 DIAGNOSIS — Z803 Family history of malignant neoplasm of breast: Secondary | ICD-10-CM

## 2023-05-24 DIAGNOSIS — F10939 Alcohol use, unspecified with withdrawal, unspecified: Secondary | ICD-10-CM | POA: Diagnosis not present

## 2023-05-24 LAB — COMPREHENSIVE METABOLIC PANEL
ALT: 23 U/L (ref 0–44)
AST: 32 U/L (ref 15–41)
Albumin: 4.4 g/dL (ref 3.5–5.0)
Alkaline Phosphatase: 89 U/L (ref 38–126)
Anion gap: 17 — ABNORMAL HIGH (ref 5–15)
BUN: 12 mg/dL (ref 8–23)
CO2: 20 mmol/L — ABNORMAL LOW (ref 22–32)
Calcium: 9.1 mg/dL (ref 8.9–10.3)
Chloride: 100 mmol/L (ref 98–111)
Creatinine, Ser: 1.34 mg/dL — ABNORMAL HIGH (ref 0.61–1.24)
GFR, Estimated: 60 mL/min — ABNORMAL LOW (ref >60)
Glucose, Bld: 199 mg/dL — ABNORMAL HIGH (ref 70–99)
Potassium: 3.9 mmol/L (ref 3.5–5.1)
Sodium: 137 mmol/L (ref 135–145)
Total Bilirubin: 1.7 mg/dL — ABNORMAL HIGH (ref 0.3–1.2)
Total Protein: 7.7 g/dL (ref 6.5–8.1)

## 2023-05-24 LAB — CBC
HCT: 41.2 % (ref 39.0–52.0)
Hemoglobin: 12.8 g/dL — ABNORMAL LOW (ref 13.0–17.0)
MCH: 27.9 pg (ref 26.0–34.0)
MCHC: 31.1 g/dL (ref 30.0–36.0)
MCV: 90 fL (ref 80.0–100.0)
Platelets: 316 10*3/uL (ref 150–400)
RBC: 4.58 MIL/uL (ref 4.22–5.81)
RDW: 14.9 % (ref 11.5–15.5)
WBC: 7.8 10*3/uL (ref 4.0–10.5)
nRBC: 0 % (ref 0.0–0.2)

## 2023-05-24 LAB — URINALYSIS, ROUTINE W REFLEX MICROSCOPIC
Bacteria, UA: NONE SEEN
Bilirubin Urine: NEGATIVE
Glucose, UA: NEGATIVE mg/dL
Ketones, ur: 20 mg/dL — AB
Nitrite: NEGATIVE
Protein, ur: 30 mg/dL — AB
RBC / HPF: >50 RBC/hpf (ref 0–5)
Specific Gravity, Urine: 1.018 (ref 1.005–1.030)
pH: 6 (ref 5.0–8.0)

## 2023-05-24 LAB — TROPONIN I (HIGH SENSITIVITY)
Troponin I (High Sensitivity): 7 ng/L (ref <18)
Troponin I (High Sensitivity): 6 ng/L (ref <18)

## 2023-05-24 LAB — PHOSPHORUS: Phosphorus: 1.2 mg/dL — ABNORMAL LOW (ref 2.5–4.6)

## 2023-05-24 LAB — LIPASE, BLOOD: Lipase: 33 U/L (ref 11–51)

## 2023-05-24 LAB — MAGNESIUM: Magnesium: 1.3 mg/dL — ABNORMAL LOW (ref 1.7–2.4)

## 2023-05-24 MED ORDER — SERTRALINE HCL 50 MG PO TABS
50.0000 mg | ORAL_TABLET | Freq: Every day | ORAL | Status: DC
  Administered 2023-05-25 – 2023-06-01 (×8): 50 mg via ORAL
  Filled 2023-05-24 (×8): qty 1

## 2023-05-24 MED ORDER — TRAZODONE HCL 100 MG PO TABS
100.0000 mg | ORAL_TABLET | Freq: Every evening | ORAL | Status: DC | PRN
  Administered 2023-05-25 – 2023-05-31 (×6): 100 mg via ORAL
  Filled 2023-05-24 (×7): qty 1

## 2023-05-24 MED ORDER — LACTATED RINGERS IV SOLN: INTRAVENOUS | Status: AC

## 2023-05-24 MED ORDER — THIAMINE MONONITRATE 100 MG PO TABS
100.0000 mg | ORAL_TABLET | Freq: Every day | ORAL | Status: DC
  Administered 2023-05-24 – 2023-06-01 (×9): 100 mg via ORAL
  Filled 2023-05-24 (×9): qty 1

## 2023-05-24 MED ORDER — ONDANSETRON HCL 4 MG/2ML IJ SOLN
4.0000 mg | Freq: Four times a day (QID) | INTRAMUSCULAR | Status: AC | PRN
  Administered 2023-05-25: 4 mg via INTRAVENOUS
  Filled 2023-05-24: qty 2

## 2023-05-24 MED ORDER — ACETAMINOPHEN 325 MG PO TABS
650.0000 mg | ORAL_TABLET | Freq: Four times a day (QID) | ORAL | Status: AC | PRN
  Administered 2023-05-25 – 2023-05-28 (×4): 650 mg via ORAL
  Filled 2023-05-24 (×4): qty 2

## 2023-05-24 MED ORDER — LORAZEPAM 2 MG/ML IJ SOLN
1.0000 mg | INTRAMUSCULAR | Status: AC | PRN
  Administered 2023-05-24: 1 mg via INTRAVENOUS
  Administered 2023-05-25 – 2023-05-26 (×2): 2 mg via INTRAVENOUS
  Filled 2023-05-24 (×3): qty 1

## 2023-05-24 MED ORDER — FOLIC ACID 1 MG PO TABS
1.0000 mg | ORAL_TABLET | Freq: Every day | ORAL | Status: DC
  Administered 2023-05-24 – 2023-06-01 (×9): 1 mg via ORAL
  Filled 2023-05-24 (×9): qty 1

## 2023-05-24 MED ORDER — METOPROLOL SUCCINATE ER 25 MG PO TB24
25.0000 mg | ORAL_TABLET | Freq: Every day | ORAL | Status: DC
  Administered 2023-05-24 – 2023-06-01 (×9): 25 mg via ORAL
  Filled 2023-05-24 (×9): qty 1

## 2023-05-24 MED ORDER — MOMETASONE FURO-FORMOTEROL FUM 100-5 MCG/ACT IN AERO
2.0000 | INHALATION_SPRAY | Freq: Two times a day (BID) | RESPIRATORY_TRACT | Status: DC
  Administered 2023-05-24 – 2023-06-01 (×14): 2 via RESPIRATORY_TRACT
  Filled 2023-05-24: qty 8.8

## 2023-05-24 MED ORDER — METOPROLOL TARTRATE 5 MG/5ML IV SOLN
5.0000 mg | INTRAVENOUS | Status: DC | PRN
  Filled 2023-05-24: qty 5

## 2023-05-24 MED ORDER — ADULT MULTIVITAMIN W/MINERALS CH
1.0000 | ORAL_TABLET | Freq: Every day | ORAL | Status: DC
  Administered 2023-05-24 – 2023-06-01 (×9): 1 via ORAL
  Filled 2023-05-24 (×9): qty 1

## 2023-05-24 MED ORDER — THIAMINE HCL 100 MG/ML IJ SOLN
100.0000 mg | Freq: Every day | INTRAMUSCULAR | Status: DC
  Filled 2023-05-24: qty 2

## 2023-05-24 MED ORDER — ALBUTEROL SULFATE (2.5 MG/3ML) 0.083% IN NEBU: 2.5000 mg | INHALATION_SOLUTION | RESPIRATORY_TRACT | Status: DC | PRN

## 2023-05-24 MED ORDER — LORAZEPAM 2 MG/ML IJ SOLN
2.0000 mg | Freq: Once | INTRAMUSCULAR | Status: AC
  Administered 2023-05-24: 2 mg via INTRAVENOUS
  Filled 2023-05-24: qty 1

## 2023-05-24 MED ORDER — HYDRALAZINE HCL 20 MG/ML IJ SOLN: 5.0000 mg | Freq: Three times a day (TID) | INTRAMUSCULAR | Status: AC | PRN

## 2023-05-24 MED ORDER — ENOXAPARIN SODIUM 40 MG/0.4ML IJ SOSY
40.0000 mg | PREFILLED_SYRINGE | INTRAMUSCULAR | Status: DC
  Administered 2023-05-24 – 2023-05-31 (×8): 40 mg via SUBCUTANEOUS
  Filled 2023-05-24 (×8): qty 0.4

## 2023-05-24 MED ORDER — ALLOPURINOL 100 MG PO TABS
100.0000 mg | ORAL_TABLET | Freq: Every day | ORAL | Status: DC
  Administered 2023-05-25 – 2023-06-01 (×8): 100 mg via ORAL
  Filled 2023-05-24 (×9): qty 1

## 2023-05-24 MED ORDER — LORAZEPAM 1 MG PO TABS
1.0000 mg | ORAL_TABLET | ORAL | Status: AC | PRN
  Administered 2023-05-24: 2 mg via ORAL
  Administered 2023-05-24: 1 mg via ORAL
  Administered 2023-05-25: 2 mg via ORAL
  Administered 2023-05-25 – 2023-05-27 (×7): 1 mg via ORAL
  Filled 2023-05-24 (×4): qty 1
  Filled 2023-05-24: qty 2
  Filled 2023-05-24: qty 1
  Filled 2023-05-24: qty 2
  Filled 2023-05-24 (×4): qty 1

## 2023-05-24 MED ORDER — MAGNESIUM SULFATE 2 GM/50ML IV SOLN
2.0000 g | Freq: Once | INTRAVENOUS | Status: AC
  Administered 2023-05-24: 2 g via INTRAVENOUS
  Filled 2023-05-24: qty 50

## 2023-05-24 MED ORDER — SODIUM CHLORIDE 0.9 % IV BOLUS
1000.0000 mL | Freq: Once | INTRAVENOUS | Status: AC
  Administered 2023-05-24: 1000 mL via INTRAVENOUS

## 2023-05-24 MED ORDER — METOPROLOL TARTRATE 5 MG/5ML IV SOLN: 5.0000 mg | INTRAVENOUS | Status: DC | PRN

## 2023-05-24 MED ORDER — ACETAMINOPHEN 650 MG RE SUPP: 650.0000 mg | Freq: Four times a day (QID) | RECTAL | Status: AC | PRN

## 2023-05-24 MED ORDER — ONDANSETRON HCL 4 MG PO TABS
4.0000 mg | ORAL_TABLET | Freq: Four times a day (QID) | ORAL | Status: AC | PRN
  Administered 2023-05-26: 4 mg via ORAL
  Filled 2023-05-24 (×2): qty 1

## 2023-05-24 MED ORDER — SENNOSIDES-DOCUSATE SODIUM 8.6-50 MG PO TABS: 1.0000 | ORAL_TABLET | Freq: Every evening | ORAL | Status: DC | PRN

## 2023-05-24 NOTE — ED Triage Notes (Signed)
Pt presents to ED with c/o of CP and pt appears to be having ETOH withdrawals. Pt states drank 1 glass of wine last night. Pt is having severe tremors at this time.

## 2023-05-24 NOTE — Assessment & Plan Note (Signed)
-   CIWA precaution ordered ?

## 2023-05-24 NOTE — Assessment & Plan Note (Addendum)
With dependence Counseling given CIWA precaution ordered Thiamine 100 mg IV/p.o. daily ordered, multivitamin 1 tablet daily, folic acid 1 mg daily ordered

## 2023-05-24 NOTE — Hospital Course (Signed)
[name] is a 62 year old male with history of alcohol abuse, anxiety, hypertension, depression, history of alcohol withdrawal, who presents to the emergency department for chief concerns of epigastric/chest pain.  Vitals in the ED showed temperature 99.2, respiration rate of 19, heart rate of 100, blood pressure 170/84, SpO2 of 97% on room air.  Serum sodium is 137, potassium is 3.9, chloride 100, bicarb 20, BUN of 12, serum creatinine 1.34, EGFR 60, nonfasting blood glucose 199, WBC 7.8, hemoglobin 12.8, platelets of 316.  High since he troponin was initially 7, UA has been ordered and pending collection, magnesium was 1.3, serum lipase was 33.  ED treatment: Ativan 2 mg IV one-time dose, magnesium 2 g IV one-time dose, sodium chloride 1 L bolus.

## 2023-05-24 NOTE — Assessment & Plan Note (Signed)
Suspect secondary to poor p.o. intake Patient is status post sodium chloride 1 L bolus via EDP Lactated ringer 125 mL/h, 1 day ordered Recheck BMP in the a.m.

## 2023-05-24 NOTE — ED Notes (Signed)
Patient consumed 100% dinner tray.

## 2023-05-24 NOTE — Assessment & Plan Note (Addendum)
  Mid lower back, and right mid inner thigh Present on admission, presumed secondary to falling in setting of resuming excessive alcohol intake They appear to be fading/healing

## 2023-05-24 NOTE — Assessment & Plan Note (Addendum)
Patient multifactorial in setting of alcohol use with withdrawal and question compliance with home metoprolol Resumed home metoprolol succinate 25 mg daily Metoprolol to tartrate 5 mg IV every 3 hours as needed for heart rate greater than 120, 3 doses ordered

## 2023-05-24 NOTE — ED Notes (Signed)
Patient removed monitor and equipment and exiting bed. Patient very unsteady and has the tremors. Patient incontinent of stool. Patient was assisted to the toilet where he had a medium loose BM. Urine obtained. Patient was cleaned, brief placed, bed changed, and monitor reapplied. Patient instructed to use the call bell.

## 2023-05-24 NOTE — H&P (Addendum)
History and Physical   [name]:[id number] DOB: [birth year] DOA: 05/24/2023  PCP: Jacky Kindle, FNP  Patient coming from: Home  I have personally briefly reviewed patient's old medical records in [facility] EMR.  Chief Concern: Epigastric/chest pain, tremors  HPI: Miguel [name] is a 62 year old male with history of alcohol abuse, anxiety, hypertension, depression, history of alcohol withdrawal, who presents to the emergency department for chief concerns of epigastric/chest pain.  Vitals in the ED showed temperature 99.2, respiration rate of 19, heart rate of 100, blood pressure 170/84, SpO2 of 97% on room air.  Serum sodium is 137, potassium is 3.9, chloride 100, bicarb 20, BUN of 12, serum creatinine 1.34, EGFR 60, nonfasting blood glucose 199, WBC 7.8, hemoglobin 12.8, platelets of 316.  High since he troponin was initially 7, UA has been ordered and pending collection, magnesium was 1.3, serum lipase was 33.  ED treatment: Ativan 2 mg IV one-time dose, magnesium 2 g IV one-time dose, sodium chloride 1 L bolus. ----------------------------- At bedside, patient was able to tell me his name, age, location, current calendar year.  He reports that he has been having right upper quadrant abdominal pain for the last 10 days.  He denies trauma to his person in the abdominal area.  He endorses frequent falls in the last 2 weeks since he has resumed EtOH intake.  He endorses that at baseline over the last 2 weeks he has been drinking half a pint of vodka per day.  He knows that this is not good for his body and that is always a bad choice and he has been trying to wean himself off of alcohol since yesterday.  He recognizes that his alcohol intake is expensive and not good for his health.  Last night he only had 1 bottle of wine.    He states that the reason he presented today was not for the right upper quadrant abdominal pain as this has been ongoing for the over 10 days.  He states he  presents for tremors that started this morning.  He denies homicidal ideation, suicidal ideation.  He states he has multiple bruises on his body from falling.  He denies any 1 harming him physically.  He denies head trauma and loss of consciousness.  He endorses poor p.o. intake of other fluid besides alcohol and he endorses poor p.o. intake of food/sustenance.  Social history: He lives at home with his wife.  He states his wife has returned from rehab however she is still not able to walk without assistance.  Her devices that was promised from the rehab facility has not arrived to their home.  He endorses increased stress as he is the only one caring for her.  He states that they recently had an argument 2 days ago.  He denies tobacco use, recreational drug use.  He endorses daily alcohol use.  ROS: Constitutional: no weight change, no fever ENT/Mouth: no sore throat, no rhinorrhea Eyes: no eye pain, no vision changes Cardiovascular: no chest pain, no dyspnea,  no edema, no palpitations Respiratory: no cough, no sputum, no wheezing Gastrointestinal: no nausea, no vomiting, no diarrhea, no constipation Genitourinary: no urinary incontinence, no dysuria, no hematuria Musculoskeletal: no arthralgias, no myalgias Skin: no skin lesions, no pruritus, Neuro: + weakness, no loss of consciousness, no syncope, + tremors Psych: no anxiety, no depression, + decrease appetite Heme/Lymph: no bruising, no bleeding  ED Course: Discussed with emergency medicine provider, patient requiring hospitalization for chief  concerns of alcohol withdrawals.  Assessment/Plan  Principal Problem:   Alcohol withdrawal with inpatient treatment [facility]) Active Problems:   AKI (acute kidney injury) (HCC)   Alcohol abuse   Total bilirubin, elevated   Generalized anxiety disorder   Hypertension   Sinus tachycardia   Alcoholic cirrhosis of liver without ascites (HCC)   Mixed hyperlipidemia   Depression   Traumatic  ecchymosis   Assessment and Plan:  * Alcohol withdrawal with inpatient treatment (HCC) CIWA precaution ordered  Total bilirubin, elevated With right upper quadrant abdominal pain Check LFTs in a.m. Right upper quadrant ultrasound ordered to assess for CBD stone If there is CBD stone, we consult GI  Alcohol abuse With dependence Counseling given CIWA precaution ordered Thiamine 100 mg IV/p.o. daily ordered, multivitamin 1 tablet daily, folic acid 1 mg daily ordered  AKI (acute kidney injury) (HCC) Suspect secondary to poor p.o. intake Patient is status post sodium chloride 1 L bolus via EDP Lactated ringer 125 mL/h, 1 day ordered Recheck BMP in the a.m.  Generalized anxiety disorder Home sertraline 50 mg daily resumed  Traumatic ecchymosis  Mid lower back, and right mid inner thigh Present on admission, presumed secondary to falling in setting of resuming excessive alcohol intake They appear to be fading/healing  Depression Home sertraline 50 mg daily resumed  Sinus tachycardia Patient multifactorial in setting of alcohol use with withdrawal and question compliance with home metoprolol Resumed home metoprolol succinate 25 mg daily Metoprolol to tartrate 5 mg IV every 3 hours as needed for heart rate greater than 120, 3 doses ordered  Hypertension Home metoprolol succinate 25 mg daily resumed  Fall precaution, aspiration precaution  Chart reviewed.   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 His smoking use included cigarettes. He has never used smokeless tobacco. He reports that he does not currently use alcohol after a past usage of about 24.0 standard drinks of alcohol per week. He reports that he does not currently use drugs after having used the following drugs: Marijuana.  No Known Allergies Family History  Problem Relation Age of Onset   Alcohol abuse Father    Breast cancer Mother 76   Anxiety disorder Brother    Other Maternal Grandmother        unknown medical history   Other Maternal Grandfather        unknow medical history   Other Paternal Grandmother        unknown medical history   Other Paternal Grandfather        unknown medical history   Healthy Brother    Family history: Family history reviewed and not pertinent  Prior to Admission medications   Medication Sig Start Date End Date Taking? Authorizing Provider  albuterol (PROAIR HFA) 108 (90 Base) MCG/ACT inhaler Inhale 1-2 puffs  into the lungs every 4 (four) hours as needed for wheezing or shortness of breath. 04/16/23   Jacky Kindle, FNP  allopurinol (ZYLOPRIM) 100 MG tablet Take 1 tablet (100 mg total) by mouth daily. 04/16/23 04/10/24  Jacky Kindle, FNP  fluticasone-salmeterol (ADVAIR) 100-50 MCG/ACT AEPB Inhale 1 puff into the lungs 2 (two) times daily. 04/20/23   Jacky Kindle, FNP  lisinopril (ZESTRIL) 40 MG tablet TAKE ONE TABLET BY MOUTH ONCE EVERY DAY. 04/16/23   Jacky Kindle, FNP  metoprolol succinate (TOPROL XL) 25 MG 24 hr tablet Take 1 tablet (25 mg total) by mouth daily. 04/16/23   Jacky Kindle, FNP  sertraline (ZOLOFT) 50 MG tablet Take 1 tablet (50 mg total) by mouth daily. 04/16/23   Jacky Kindle, FNP  traZODone (DESYREL) 100 MG tablet Take 1 tablet (100 mg total) by mouth at bedtime as needed. 04/20/23   Jacky Kindle, FNP   Physical Exam: Vitals:    05/24/23 1400 05/24/23 1435 05/24/23 1515 05/24/23 1600  BP: (!) 157/88  (!) 157/97 (!) 168/101  Pulse: 94  86 92  Resp: 18  18 20   Temp:   98.2 F (36.8 C)   TempSrc:   Oral   SpO2: 93%  97% 97%  Weight:      Height:  6' (1.829 m)     Constitutional: appears older than chronological age, frail Eyes: PERRL, lids and conjunctivae normal ENMT: Mucous membranes are moist. Posterior pharynx clear of any exudate or lesions. Age-appropriate dentition. Hearing appropriate Neck: normal, supple, no masses, no thyromegaly Respiratory: clear to auscultation bilaterally, no wheezing, no crackles. Normal respiratory effort. No accessory muscle use.  Cardiovascular: Regular rate and rhythm, no murmurs / rubs / gallops. No extremity edema. 2+ pedal pulses. No carotid bruits.  Abdomen: no tenderness, no masses palpated, no hepatosplenomegaly. Bowel sounds positive.  Musculoskeletal: no clubbing / cyanosis. No joint deformity upper and lower extremities. Good ROM, no contractures, no atrophy. Normal muscle tone.  Skin: no rashes, lesions, ulcers. No induration.  Multiple ecchymosis, present on admission Neurologic: Sensation intact. Strength 5/5 in all 4.  Psychiatric: Normal judgment and insight. Alert and oriented x 3.  Depressed mood.   EKG: independently reviewed, showing sinus tachycardia with rate of 125, QTc 459  Chest x-ray on Admission: I personally reviewed and I agree with radiologist reading as below.  DG Chest Portable 1 View  Result Date: 05/24/2023 CLINICAL DATA:  Chest pain. EXAM: PORTABLE CHEST 1 VIEW COMPARISON:  Chest x-ray dated April 28, 2023. FINDINGS: The heart size and mediastinal contours are within normal limits. Both lungs are clear. The visualized skeletal structures are unremarkable. IMPRESSION: No active disease. Electronically Signed   By: Obie Dredge M.D.   On: 05/24/2023 12:11    Labs on Admission: I have personally reviewed following labs  CBC: Recent Labs  Lab  05/24/23 1125  WBC 7.8  HGB 12.8*  HCT 41.2  MCV 90.0  PLT 316   Basic Metabolic Panel: Recent Labs  Lab 05/24/23 1125 05/24/23 1328  NA 137  --   K 3.9  --   CL 100  --   CO2 20*  --   GLUCOSE 199*  --   BUN 12  --   CREATININE 1.34*  --   CALCIUM 9.1  --   MG 1.3*  --   PHOS  --  1.2*   GFR: Estimated Creatinine Clearance: 61.9 mL/min (A) (by C-G formula based on SCr  of 1.34 mg/dL (H)).  Liver Function Tests: Recent Labs  Lab 05/24/23 1125  AST 32  ALT 23  ALKPHOS 89  BILITOT 1.7*  PROT 7.7  ALBUMIN 4.4   Recent Labs  Lab 05/24/23 1125  LIPASE 33   Urine analysis:    Component Value Date/Time   COLORURINE YELLOW (A) 05/24/2023 1529   APPEARANCEUR HAZY (A) 05/24/2023 1529   APPEARANCEUR Hazy 06/26/2014 2238   LABSPEC 1.018 05/24/2023 1529   LABSPEC 1.036 06/26/2014 2238   PHURINE 6.0 05/24/2023 1529   GLUCOSEU NEGATIVE 05/24/2023 1529   GLUCOSEU Negative 06/26/2014 2238   HGBUR MODERATE (A) 05/24/2023 1529   BILIRUBINUR NEGATIVE 05/24/2023 1529   BILIRUBINUR 1+ 06/26/2014 2238   KETONESUR 20 (A) 05/24/2023 1529   PROTEINUR 30 (A) 05/24/2023 1529   NITRITE NEGATIVE 05/24/2023 1529   LEUKOCYTESUR TRACE (A) 05/24/2023 1529   LEUKOCYTESUR 1+ 06/26/2014 2238   This document was prepared using Dragon Voice Recognition software and may include unintentional dictation errors.  Dr. Sedalia Muta [facility]  If 7PM-7AM, please contact overnight-coverage provider If 7AM-7PM, please contact day attending provider [email or web address]  05/24/2023, 4:32 PM

## 2023-05-24 NOTE — Assessment & Plan Note (Signed)
Home sertraline 50 mg daily resumed 

## 2023-05-24 NOTE — Assessment & Plan Note (Addendum)
With right upper quadrant abdominal pain Check LFTs in a.m. Right upper quadrant ultrasound ordered to assess for CBD stone If there is CBD stone, we consult GI

## 2023-05-24 NOTE — ED Provider Notes (Signed)
[facility] Provider Note    Event Date/Time   First MD Initiated Contact with Patient 05/24/23 1111     (approximate)   History   Chief Complaint Chest Pain   HPI  Miguel [name] is a 62 y.o. male with past medical history of hypertension, alcohol abuse, asthma, and anxiety who presents to the ED complaining of chest pain.  Patient reports that for the past week he has been dealing with pain in the center of his chest as well as his upper abdomen.  He has felt gradually worse during this time, describes increasing tremors over the past 24 hours.  He states he has been feeling nauseous and has vomited with some diarrhea, has also noticed some bright red blood in his stool.  He denies any fevers, cough, or difficulty breathing.  He does state that he has been cutting back on how much alcohol he consumes, drink a bottle of wine last night but was previously drinking about a pint of vodka daily.  He denies any history of seizures.     Physical Exam   Triage Vital Signs: ED Triage Vitals  Enc Vitals Group     BP 05/24/23 1109 (!) 124/99     Pulse Rate 05/24/23 1110 (!) 141     Resp 05/24/23 1109 20     Temp --      Temp src --      SpO2 05/24/23 1109 98 %     Weight --      Height --      Head Circumference --      Peak Flow --      Pain Score 05/24/23 1110 8     Pain Loc --      Pain Edu? --      Excl. in GC? --     Most recent vital signs: Vitals:   05/24/23 1245 05/24/23 1300  BP: (!) 135/94 (!) 176/89  Pulse: 100 95  Resp: 18 18  Temp:    SpO2: 98% 97%    Constitutional: Alert and oriented. Eyes: Conjunctivae are normal. Head: Atraumatic. Nose: No congestion/rhinnorhea. Mouth/Throat: Mucous membranes are moist.  Cardiovascular: Tachycardic, regular rhythm. Grossly normal heart sounds.  2+ radial pulses bilaterally. Respiratory: Normal respiratory effort.  No retractions. Lungs CTAB. Gastrointestinal: Soft and nontender. No  distention. Musculoskeletal: No lower extremity tenderness nor edema.  Neurologic:  Normal speech and language.  Tremulous with tongue fasciculations.  No gross focal neurologic deficits are appreciated.    ED Results / Procedures / Treatments   Labs (all labs ordered are listed, but only abnormal results are displayed) Labs Reviewed  COMPREHENSIVE METABOLIC PANEL - Abnormal; Notable for the following components:      Result Value   CO2 20 (*)    Glucose, Bld 199 (*)    Creatinine, Ser 1.34 (*)    Total Bilirubin 1.7 (*)    GFR, Estimated 60 (*)    Anion gap 17 (*)    All other components within normal limits  CBC - Abnormal; Notable for the following components:   Hemoglobin 12.8 (*)    All other components within normal limits  MAGNESIUM - Abnormal; Notable for the following components:   Magnesium 1.3 (*)    All other components within normal limits  LIPASE, BLOOD  URINALYSIS, ROUTINE W REFLEX MICROSCOPIC  ETHANOL  TROPONIN I (HIGH SENSITIVITY)  TROPONIN I (HIGH SENSITIVITY)     EKG  ED ECG REPORT I,  Chesley Noon, the attending physician, personally viewed and interpreted this ECG.   Date: 05/24/2023  EKG Time: 11:19  Rate: 125  Rhythm: sinus tachycardia  Axis: LAD  Intervals:none  ST&T Change: None  RADIOLOGY Chest x-ray reviewed and interpreted by me with no infiltrate, edema, or effusion.  PROCEDURES:  Critical Care performed: Yes, see critical care procedure note(s)  .Critical Care  Performed by: Chesley Noon, MD Authorized by: Chesley Noon, MD   Critical care provider statement:    Critical care time (minutes):  30   Critical care time was exclusive of:  Separately billable procedures and treating other patients and teaching time   Critical care was necessary to treat or prevent imminent or life-threatening deterioration of the following conditions:  Metabolic crisis   Critical care was time spent personally by me on the following  activities:  Development of treatment plan with patient or surrogate, discussions with consultants, evaluation of patient's response to treatment, examination of patient, ordering and review of laboratory studies, ordering and review of radiographic studies, ordering and performing treatments and interventions, pulse oximetry, re-evaluation of patient's condition and review of old charts   I assumed direction of critical care for this patient from another provider in my specialty: no     Care discussed with: admitting provider      MEDICATIONS ORDERED IN ED: Medications  magnesium sulfate IVPB 2 g 50 mL (2 g Intravenous New Bag/Given 05/24/23 1241)  LORazepam (ATIVAN) injection 2 mg (2 mg Intravenous Given 05/24/23 1134)  sodium chloride 0.9 % bolus 1,000 mL (0 mLs Intravenous Stopped 05/24/23 1305)     IMPRESSION / MDM / ASSESSMENT AND PLAN / ED COURSE  I reviewed the triage vital signs and the nursing notes.                              62 y.o. male with past medical history of hypertension, alcohol abuse, anxiety, and asthma who presents to the ED with increasing upper abdominal pain and chest pain over the past week with tremors over the past 24 hours since cutting back on alcohol consumption.  Patient's presentation is most consistent with acute presentation with potential threat to life or bodily function.  Differential diagnosis includes, but is not limited to, alcohol withdrawal, electrolyte abnormality, anemia, GI bleed, ACS, PE, pneumonia, pneumothorax, alcoholic gastritis, pancreatitis, hepatitis, biliary colic, cholecystitis.  Patient ill-appearing, tachycardic and quite tremulous with tongue fasciculations.  Presentation concerning for alcohol withdrawal and we will treat with IV Ativan, hydrate with IV fluids.  He has no focal neurologic deficits on exam, abdominal exam is currently benign.  EKG shows sinus tachycardia with no ischemic changes.  Troponin and additional labs are  pending at this time, will also check chest x-ray and reassess following IV Ativan and fluids.  Labs remarkable for hypomagnesemia and mild AKI, no significant anemia or leukocytosis noted, LFTs are unremarkable and troponin within normal limits.  Chest x-ray unremarkable and patient's abdominal exam remains benign on reassessment.  He reports feeling better with improved vital signs following IV Ativan and fluids.  Case discussed with hospitalist for admission.      FINAL CLINICAL IMPRESSION(S) / ED DIAGNOSES   Final diagnoses:  Alcohol withdrawal syndrome without complication (HCC)  Hypomagnesemia     Rx / DC Orders   ED Discharge Orders     None        Note:  This document was prepared using  Dragon Chemical engineer and may include unintentional dictation errors.   Chesley Noon, MD 05/24/23 1315

## 2023-05-24 NOTE — Assessment & Plan Note (Signed)
Home metoprolol succinate 25 mg daily resumed

## 2023-05-25 DIAGNOSIS — F10931 Alcohol use, unspecified with withdrawal delirium: Secondary | ICD-10-CM | POA: Diagnosis not present

## 2023-05-25 LAB — BASIC METABOLIC PANEL
Anion gap: 7 (ref 5–15)
Anion gap: 7 (ref 5–15)
BUN: 12 mg/dL (ref 8–23)
BUN: 11 mg/dL (ref 8–23)
CO2: 25 mmol/L (ref 22–32)
CO2: 25 mmol/L (ref 22–32)
Calcium: 8.3 mg/dL — ABNORMAL LOW (ref 8.9–10.3)
Calcium: 8.3 mg/dL — ABNORMAL LOW (ref 8.9–10.3)
Chloride: 104 mmol/L (ref 98–111)
Chloride: 101 mmol/L (ref 98–111)
Creatinine, Ser: 0.9 mg/dL (ref 0.61–1.24)
Creatinine, Ser: 0.97 mg/dL (ref 0.61–1.24)
GFR, Estimated: >60 mL/min (ref >60)
GFR, Estimated: >60 mL/min (ref >60)
Glucose, Bld: 121 mg/dL — ABNORMAL HIGH (ref 70–99)
Glucose, Bld: 133 mg/dL — ABNORMAL HIGH (ref 70–99)
Potassium: 3.9 mmol/L (ref 3.5–5.1)
Potassium: 3.8 mmol/L (ref 3.5–5.1)
Sodium: 136 mmol/L (ref 135–145)
Sodium: 133 mmol/L — ABNORMAL LOW (ref 135–145)

## 2023-05-25 LAB — HEPATIC FUNCTION PANEL
ALT: 14 U/L (ref 0–44)
AST: 16 U/L (ref 15–41)
Albumin: 3.2 g/dL — ABNORMAL LOW (ref 3.5–5.0)
Alkaline Phosphatase: 66 U/L (ref 38–126)
Bilirubin, Direct: 0.3 mg/dL — ABNORMAL HIGH (ref 0.0–0.2)
Indirect Bilirubin: 0.9 mg/dL (ref 0.3–0.9)
Total Bilirubin: 1.2 mg/dL (ref 0.3–1.2)
Total Protein: 5.8 g/dL — ABNORMAL LOW (ref 6.5–8.1)

## 2023-05-25 LAB — CBC
HCT: 35 % — ABNORMAL LOW (ref 39.0–52.0)
Hemoglobin: 10.8 g/dL — ABNORMAL LOW (ref 13.0–17.0)
MCH: 28.1 pg (ref 26.0–34.0)
MCHC: 30.9 g/dL (ref 30.0–36.0)
MCV: 90.9 fL (ref 80.0–100.0)
Platelets: 241 10*3/uL (ref 150–400)
RBC: 3.85 MIL/uL — ABNORMAL LOW (ref 4.22–5.81)
RDW: 14.6 % (ref 11.5–15.5)
WBC: 7.3 10*3/uL (ref 4.0–10.5)
nRBC: 0 % (ref 0.0–0.2)

## 2023-05-25 NOTE — Discharge Instructions (Signed)
                  Intensive Outpatient Programs  [facility]    [facility] [address]     [address] [place],  [place]     [place], [place] [phone]      [phone]  [facility]   [facility]  (Inpatient and outpatient)  [phone] (Suboxone and Methadone) [address]           [phone]           [facility]    [facility] - Intensive Outpatient [address]     [address] [place], [place] [address]     [place], [place]  [phone]      [phone]  [facility] (Outpatient, Inpatient, [facility]  [facility] (Groups and [facility]) (insurance only) [phone]    [place], [place]          [phone]       [facility]    [facility] (for caregivers and family) [address]     [address] [place], [place]     [place], [place] [phone]      [phone]  Residential Treatment Programs  [facility]  [facility](2 years) Residential: 90 days)  [facility] [address]      [address] [place], [place]     [place], [place] [phone]      [phone] or [phone]  [facility]    [facility] [address]      [address] [place], [place]     [place], [place] [phone]      [phone]  [facility]   [facility] ([facility] [address]     [address] [place], [place] [address]     [place], [place] [phone]      [phone] Admissions: 8am-3pm M-F  [facility]: Residential Program (90 Days)              [facility]: [facility]  [place], [place]     [place], [place]  [phone] or [phone]    (Walk in Hours over the weekend or by referral)   [facility]: [facility]:[phone] (for crisis  response 24 hours a day) 

## 2023-05-25 NOTE — TOC Initial Note (Signed)
Transition of Care [facility]) - Initial/Assessment Note    Patient Details  Name: Miguel [name] MRN: [id number] Date of Birth: 1961-02-19  Transition of Care [facility]) CM/SW Contact:    Kreg Shropshire, RN Phone Number: 05/25/2023, 4:32 PM  Clinical Narrative:                 Toc received consult for Substance abuse. Pt stated that he lives at home with his wife, his car is at hospital and family with provide transportation when d/c home if needed. Pt agreed to substance abuse resources. Cm added to AVS. No DME at home. Readmission prevent screen completed. Will continue to follow for toc needs.        Patient Goals and CMS Choice            Expected Discharge Plan and Services                                              Prior Living Arrangements/Services                       Activities of Daily Living Home Assistive Devices/Equipment: Cane (specify quad or straight) ADL Screening (condition at time of admission) Patient's cognitive ability adequate to safely complete daily activities?: Yes Is the patient deaf or have difficulty hearing?: No Does the patient have difficulty seeing, even when wearing glasses/contacts?: No Does the patient have difficulty concentrating, remembering, or making decisions?: No Patient able to express need for assistance with ADLs?: Yes Does the patient have difficulty dressing or bathing?: No Independently performs ADLs?: Yes (appropriate for developmental age) Does the patient have difficulty walking or climbing stairs?: No Weakness of Legs: None Weakness of Arms/Hands: None  Permission Sought/Granted                  Emotional Assessment              Admission diagnosis:  Alcohol withdrawal with inpatient treatment [facility]) [F10.939] Patient Active Problem List   Diagnosis Date Noted   Alcohol withdrawal with inpatient treatment (HCC) 05/24/2023   Total bilirubin, elevated 05/24/2023   Traumatic ecchymosis  05/24/2023   Alcoholic ketoacidosis 04/29/2023   Hypokalemia 04/29/2023   Hypocalcemia 04/29/2023   Essential hypertension 04/29/2023   Depression 04/29/2023   Syncope 04/28/2023   Encounter for screening for HIV 04/16/2023   Encounter for hepatitis C screening test for low risk patient 04/16/2023   Establishing care with new doctor, encounter for 04/16/2023   Hyperthyroidism 04/16/2023   Elevated serum glucose 04/16/2023   Mixed hyperlipidemia 04/16/2023   Centrilobular emphysema (HCC) 04/16/2023   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 [place] [address] Phone: [phone] Fax: [phone]  [id number] - [place] ([place]), [place] - [address]  [address] [place] [address] Phone: [phone] Fax: [phone]  [id number] [place], [place] - [address] AT [facility] [address] [place] [address] Phone: [phone] Fax: [phone]  [facility] [address] [place] [address] Phone: [phone] Fax: [phone]  [facility] [address], [place] - [address] [place] [address] Phone: [phone] Fax: [phone]  [id number] [place], [place] - [address] [place] [address] Phone: [phone] Fax: [phone]     Social  Determinants of Health (SDOH) Social History: SDOH Screenings   Food Insecurity: No Food Insecurity (05/24/2023)  Housing: Low Risk  (05/24/2023)  Transportation Needs: No Transportation Needs (05/24/2023)  Utilities: Not At Risk (05/24/2023)  Alcohol Screen: Medium Risk (03/19/2021)  Depression (PHQ2-9): Low Risk  (04/16/2023)  Financial Resource Strain: High Risk (09/18/2020)  Physical Activity: Sufficiently Active (09/18/2020)  Social Connections: Socially Isolated (09/18/2020)  Stress: Stress Concern Present (09/18/2020)  Tobacco Use: Medium Risk (05/24/2023)   SDOH Interventions:     Readmission Risk Interventions    05/25/2023    4:31 PM 01/19/2023   12:40 PM  Readmission Risk Prevention Plan  Transportation Screening Complete Complete  Medication Review (RN Care Manager) Referral to Pharmacy Complete  PCP or Specialist appointment within 3-5 days of discharge Complete Complete  HRI or Home Care Consult Complete   SW Recovery Care/Counseling Consult Complete Complete  Palliative Care Screening Not Applicable Not Applicable  Skilled Nursing Facility Not Applicable Not Applicable

## 2023-05-25 NOTE — Progress Notes (Signed)
Progress Note   Patient: Miguel [name] QVZ:[id number] DOB: 1961/01/17 DOA: 05/24/2023     1 DOS: the patient was seen and examined on 05/25/2023     Subjective:  Patient seen and examined at bedside this morning Denies nausea vomiting And have some right quadrant abdominal pain but tells me it is improved Reviewed ultrasound of the abdomen that did not show any acute pathology   Brief hospital course:  From HPI "[name] is a 62 year old male with history of alcohol abuse, anxiety, hypertension, depression, history of alcohol withdrawal, who presents to the emergency department for chief concerns of epigastric Vitals in the ED showed temperature 99.2, respiration rate of 19, heart rate of 100, blood pressure 170/84, SpO2 of 97% on room air. Serum sodium is 137, potassium is 3.9, chloride 100, bicarb 20, BUN of 12, serum creatinine 1.34, EGFR 60, nonfasting blood glucose 199, WBC 7.8, hemoglobin 12.8, platelets of 316."    Assessment and Plan  Alcohol withdrawal with inpatient treatment (HCC) CIWA precaution ordered Fall precaution Seizure precaution   Total bilirubin, elevated With right upper quadrant abdominal pain Abdominal ultrasound did not show any acute abdominal pathology Continue to monitor LFTs   Alcohol abuse With dependence Counseling given Continue CIWA protocol Continue thiamine 100 mg IV/p.o. daily ordered, multivitamin 1 tablet daily, folic acid 1 mg daily ordered Delirium precaution   AKI (acute kidney injury) (HCC)-improved Presented with creatinine of 1.34 which is now 0.9 Suspect secondary to poor p.o. intake Patient is status post sodium chloride 1 L bolus via EDP Lactated ringer 125 mL/h, 1 day ordered Monitor renal function closely Avoid nephrotoxic medications   Generalized anxiety disorder Continue Home sertraline 50 mg daily resumed   Traumatic ecchymosis Mid lower back, and right mid inner thigh Present on admission, presumed secondary  to falling in setting of resuming excessive alcohol intake They appear to be fading/healing PT OT consulted   Depression Home sertraline 50 mg daily resumed   Sinus tachycardia Patient multifactorial in setting of alcohol use with withdrawal and question compliance with home metoprolol Continue home metoprolol succinate 25 mg daily Metoprolol to tartrate 5 mg IV every 3 hours as needed for heart rate greater than 120, 3 doses ordered   Hypertension Continue Home metoprolol succinate 25 mg daily resumed   Fall precaution, aspiration precaution   Chart reviewed.    DVT prophylaxis: Enoxaparin  Code Status: Full code  Diet: Heart healthy diet   Disposition Plan: Pending clinical course  Consults called: None at this time  Admission status: Telemetry medical, inpatient   Physical Exam:   General: Laying in bed slightly confused Eyes: PERRL, lids and conjunctivae normal ENMT: Mucous membranes are moist. Posterior pharynx clear of any exudate or lesions. Age-appropriate dentition. Hearing appropriate Neck: normal, supple, no masses, no thyromegaly Respiratory: clear to auscultation bilaterally Cardiovascular: Regular rate and rhythm Abdomen: no tenderness, no masses palpated Musculoskeletal: no clubbing / cyanosis.   Skin: Bruise noted to the right inner thigh the right knee area as well as ecchymosis with some ulceration to the lumbar region  Neurologic: Alert and awake but slightly confused moving all extremities    Data Reviewed:I have reviewed patient's chest x-ray, lab results, ultrasound of the abdomen vitals as well as nurses and ED physician documentation  Abdominal ultrasound did not show any acute intra-abdominal pathology I personally reviewed chest x-ray did not show any cardiopulmonary process   Family Communication: No family present at bedside today   Time spent:  50 minutes spent reviewing patient's previous chart, imaging results, labs, vitals, ED  physician documentation as well as taking care of patient      Vitals:   05/25/23 1100 05/25/23 1200 05/25/23 1500 05/25/23 1600  BP: 114/65 (!) 137/107 (!) 167/95 (!) 158/85  Pulse: 88 94 83 90  Resp: (!) 33 20 15 (!) 23  Temp:      TempSrc:      SpO2: 96% 95% 96% 97%  Weight:      Height:           Author: Loyce Dys, MD 05/25/2023 4:29 PM  For on call review [email or web address].

## 2023-05-25 NOTE — Progress Notes (Signed)
Patient received from the ED awake and alert. Oriented to person and place. Scattered scabs noted, large bruise to coccyx and scab. Scab to r knee. Scab to forearms. Scattered other bruises and scabs in various stages of healing. Patient unable to recall where the bruises came from. Gown on, patient is comfortable with call light in reach.   Miguel [name]

## 2023-05-26 DIAGNOSIS — F10939 Alcohol use, unspecified with withdrawal, unspecified: Secondary | ICD-10-CM | POA: Diagnosis not present

## 2023-05-26 DIAGNOSIS — K7030 Alcoholic cirrhosis of liver without ascites: Secondary | ICD-10-CM

## 2023-05-26 DIAGNOSIS — F411 Generalized anxiety disorder: Secondary | ICD-10-CM

## 2023-05-26 LAB — BASIC METABOLIC PANEL
Anion gap: 7 (ref 5–15)
BUN: 10 mg/dL (ref 8–23)
CO2: 26 mmol/L (ref 22–32)
Calcium: 8.4 mg/dL — ABNORMAL LOW (ref 8.9–10.3)
Chloride: 103 mmol/L (ref 98–111)
Creatinine, Ser: 1.03 mg/dL (ref 0.61–1.24)
GFR, Estimated: >60 mL/min (ref >60)
Glucose, Bld: 122 mg/dL — ABNORMAL HIGH (ref 70–99)
Potassium: 3.9 mmol/L (ref 3.5–5.1)
Sodium: 136 mmol/L (ref 135–145)

## 2023-05-26 LAB — CBC WITH DIFFERENTIAL/PLATELET
Abs Immature Granulocytes: 0.04 10*3/uL (ref 0.00–0.07)
Basophils Absolute: 0 10*3/uL (ref 0.0–0.1)
Basophils Relative: 0 %
Eosinophils Absolute: 0.1 10*3/uL (ref 0.0–0.5)
Eosinophils Relative: 1 %
HCT: 33.7 % — ABNORMAL LOW (ref 39.0–52.0)
Hemoglobin: 10.6 g/dL — ABNORMAL LOW (ref 13.0–17.0)
Immature Granulocytes: 0 %
Lymphocytes Relative: 12 %
Lymphs Abs: 1.1 10*3/uL (ref 0.7–4.0)
MCH: 28.1 pg (ref 26.0–34.0)
MCHC: 31.5 g/dL (ref 30.0–36.0)
MCV: 89.4 fL (ref 80.0–100.0)
Monocytes Absolute: 1.5 10*3/uL — ABNORMAL HIGH (ref 0.1–1.0)
Monocytes Relative: 17 %
Neutro Abs: 6.2 10*3/uL (ref 1.7–7.7)
Neutrophils Relative %: 70 %
Platelets: 226 10*3/uL (ref 150–400)
RBC: 3.77 MIL/uL — ABNORMAL LOW (ref 4.22–5.81)
RDW: 14.5 % (ref 11.5–15.5)
WBC: 9 10*3/uL (ref 4.0–10.5)
nRBC: 0 % (ref 0.0–0.2)

## 2023-05-26 MED ORDER — OXYCODONE HCL 5 MG PO TABS
5.0000 mg | ORAL_TABLET | ORAL | Status: AC | PRN
  Administered 2023-05-26: 5 mg via ORAL
  Filled 2023-05-26: qty 1

## 2023-05-26 MED ORDER — CHLORDIAZEPOXIDE HCL 10 MG PO CAPS
10.0000 mg | ORAL_CAPSULE | Freq: Three times a day (TID) | ORAL | Status: DC
  Administered 2023-05-26 – 2023-05-27 (×3): 10 mg via ORAL
  Filled 2023-05-26 (×3): qty 1

## 2023-05-26 MED ORDER — COLCHICINE 0.6 MG PO TABS
1.2000 mg | ORAL_TABLET | Freq: Once | ORAL | Status: AC
  Administered 2023-05-26: 1.2 mg via ORAL
  Filled 2023-05-26: qty 2

## 2023-05-26 MED ORDER — COLCHICINE 0.6 MG PO TABS
0.6000 mg | ORAL_TABLET | Freq: Two times a day (BID) | ORAL | Status: DC
  Administered 2023-05-27 – 2023-06-01 (×11): 0.6 mg via ORAL
  Filled 2023-05-26 (×11): qty 1

## 2023-05-26 MED ORDER — KETOROLAC TROMETHAMINE 30 MG/ML IJ SOLN
30.0000 mg | Freq: Four times a day (QID) | INTRAMUSCULAR | Status: AC
  Administered 2023-05-26 – 2023-05-27 (×8): 30 mg via INTRAVENOUS
  Filled 2023-05-26 (×8): qty 1

## 2023-05-26 NOTE — Evaluation (Signed)
Occupational Therapy Evaluation Patient Details Name: Miguel [name] MRN: [id number] DOB: 1961/01/04 Today's Date: 05/26/2023   History of Present Illness Pt is a 62 y/o M who presents with chest and right upper quadrant pain, attributed to alcohol abuse and AKI, counseling provided and medication. PMH significant for alcohol abuse, HTN, depression, anxiety, GERD, gout, OCD   Clinical Impression   Patient received for OT evaluation. See flowsheet below for details of function. Generally, patient requiring supervision for bed mobility, CGA with RW for functional mobility, and supervision-MIN A for ADLs. Pt with decreased safety awareness during mobility; decreased activity tolerance; tremors. At high risk for falls.  Patient will benefit from continued OT while in acute care.       Recommendations for follow up therapy are one component of a multi-disciplinary discharge planning process, led by the attending physician.  Recommendations may be updated based on patient status, additional functional criteria and insurance authorization.   Assistance Recommended at Discharge Frequent or constant Supervision/Assistance  Patient can return home with the following A little help with walking and/or transfers;A little help with bathing/dressing/bathroom;Assistance with cooking/housework;Direct supervision/assist for financial management;Direct supervision/assist for medications management;Assist for transportation;Help with stairs or ramp for entrance    Functional Status Assessment  Patient has had a recent decline in their functional status and demonstrates the ability to make significant improvements in function in a reasonable and predictable amount of time.  Equipment Recommendations  Other (comment) (defer to next venue of care)    Recommendations for Other Services       Precautions / Restrictions Precautions Precautions: Fall Restrictions Weight Bearing Restrictions: No      Mobility  Bed Mobility Overal bed mobility: Needs Assistance Bed Mobility: Supine to Sit, Sit to Supine     Supine to sit: Supervision Sit to supine: Supervision        Transfers Overall transfer level: Needs assistance Equipment used: Rolling walker (2 wheels) Transfers: Sit to/from Stand Sit to Stand: Min guard           General transfer comment: RW with extra time and effort      Balance Overall balance assessment: Needs assistance Sitting-balance support: Feet supported, No upper extremity supported Sitting balance-Leahy Scale: Good     Standing balance support: Bilateral upper extremity supported, Reliant on assistive device for balance Standing balance-Leahy Scale: Fair Standing balance comment: heavy UE support                           ADL either performed or assessed with clinical judgement   ADL Overall ADL's : Needs assistance/impaired     Grooming: Wash/dry face;Oral care;Brushing hair;Supervision/safety;Sitting Grooming Details (indicate cue type and reason): seated on BSC as a chair in front of sink in bathroom Upper Body Bathing: Supervision/ safety;Set up;Sitting   Lower Body Bathing: Supervison/ safety;Set up;Sitting/lateral leans;Sit to/from stand Lower Body Bathing Details (indicate cue type and reason): pt washing upper part of legs and buttocks from standing position; lower legs washing seated in figure four position. Constant supervision for safety. Pt with slight tremors throughout the task. Upper Body Dressing : Set up;Sitting Upper Body Dressing Details (indicate cue type and reason): for donning gown Lower Body Dressing: Set up;Supervision/safety;Sitting/lateral leans Lower Body Dressing Details (indicate cue type and reason): Pt seated in figure four to [place]hi[place] nonskid socks Toilet Transfer: Min guard;Regular Toilet;Grab bars;Rolling walker (2 wheels);Minimal assistance Toilet Transfer Details (indicate cue type and reason): OT cues and CGA  for safety; t/f to low toilet, heavy use of grab bar.  MIN A sit to stand t/f. Toileting- Architect and Hygiene: Min guard;Sit to/from stand Toileting - Clothing Manipulation Details (indicate cue type and reason): CGA when standing for hygiene   Tub/Shower Transfer Details (indicate cue type and reason): not attempted today; bathing in front of sink while seated on BSC as a seat. Functional mobility during ADLs: Rolling walker (2 wheels);Min guard;Cueing for safety General ADL Comments: Pt requiring close supervision while seated, CGA while seated; demonstrating decreased safety awareness.     Vision         Perception     Praxis      Pertinent Vitals/Pain Pain Assessment Pain Assessment: 0-10 Pain Score: 9  Pain Location: R knee Pain Descriptors / Indicators: Aching, Dull, Discomfort Pain Intervention(s): Limited activity within patient's tolerance, Monitored during session     Hand Dominance Right   Extremity/Trunk Assessment Upper Extremity Assessment Upper Extremity Assessment: Generalized weakness;Overall [facility] for tasks assessed   Lower Extremity Assessment Lower Extremity Assessment: Generalized weakness;Defer to PT evaluation       Communication Communication Communication: No difficulties   Cognition Arousal/Alertness: Awake/alert Behavior During Therapy: WFL for tasks assessed/performed Overall Cognitive Status: No family/caregiver present to determine baseline cognitive functioning                                 General Comments: Pt is cooperative, shows decreased safety awareness. Oriented to person, place, time, and situation, however demonstrates decreased short term memory, as RN gave patient medications and then approx 10 minutes later pt asked OT what the medications were for (even though RN had already told pt).  Pt needing cues for safety, as he was reaching out for objects (such as linen bag cart) instead of using RW for  stability.     General Comments  Tremors throughout session in BIL UE and LE. Decreased activity tolerance. Decreased safety awareness. Pt on room air.    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 Unknown other assistance avaialble.      Prior Functioning/Environment Prior Level of Function : Independent/Modified Independent;Driving             Mobility Comments: independent with mobility, reports multiple falls a week d/t balance ADLs Comments: (I) PTA.        OT Problem List: Decreased strength;Decreased activity tolerance;Impaired balance (sitting and/or standing);Decreased coordination;Decreased safety awareness;Decreased knowledge of use of DME or AE;Pain      OT Treatment/Interventions: Self-care/ADL training;Therapeutic exercise;Patient/family education;Therapeutic activities;DME and/or AE instruction    OT Goals(Current goals can be found in the care plan section) Acute Rehab OT Goals Patient Stated Goal: Get better OT Goal Formulation: With patient Time For Goal Achievement: 06/09/23 Potential to Achieve Goals: Good ADL Goals Pt Will Perform Lower Body Bathing: with modified independence;sit to/from stand Pt Will Perform Lower Body Dressing: with modified independence;sit to/from  stand Pt Will Transfer to Toilet: with modified independence;ambulating Pt Will Perform Toileting - Clothing Manipulation and hygiene: with modified  independence;sit to/from stand Pt Will Perform Tub/Shower Transfer: with modified independence;rolling walker;ambulating;tub bench  OT Frequency: Min 1X/week    Co-evaluation              AM-PAC OT "6 Clicks" Daily Activity     Outcome Measure Help from another person eating meals?: None Help from another person taking care of personal grooming?: A Little Help from another person toileting, which includes using toliet, bedpan, or urinal?: A Little Help from another person bathing (including washing, rinsing, drying)?: A Little Help from another person to put on and taking off regular upper body clothing?: None Help from another person to put on and taking off regular lower body clothing?: A Little 6 Click Score: 20   End of Session Equipment Utilized During Treatment: Rolling walker (2 wheels) (BSC as a seat for seated bathing at sink) Nurse Communication: Mobility status;Other (comment) (ADL status communicated to NA and RN)  Activity Tolerance: Patient tolerated treatment well;Patient limited by fatigue Patient left: in bed;with call bell/phone within reach;with bed alarm set  OT Visit Diagnosis: Unsteadiness on feet (R26.81);Repeated falls (R29.6);Muscle weakness (generalized) (M62.81)                Time: 1610-9604 OT Time Calculation (min): 43 min Charges:  OT General Charges $OT Visit: 1 Visit OT Evaluation $OT Eval Moderate Complexity: 1 Mod OT Treatments $Self Care/Home Management : 23-37 mins  Linward Foster, MS, OTR/L  Alvester Morin 05/26/2023, 2:05 PM

## 2023-05-26 NOTE — Evaluation (Addendum)
Physical Therapy Evaluation Patient Details Name: Miguel [name] MRN: [id number] DOB: June 29, 1961 Today's Date: 05/26/2023  History of Present Illness  Pt is a 62 y/o M who presents with chest and right upper quadrant pain, attributed to alcohol abuse and AKI, counseling provided and medication. PMH significant for alcohol abuse, HTN, depression, anxiety, GERD, gout, OCD  Clinical Impression  Pt in bed with tray, oriented to self, year, and place. Pt reports 8/10 pain in R knee, pt states its from his gout, worsens to 10/10 c/ mobility. PTA pt was independent for mobility and ADL's, lives with wife who has MS and is her primary caretaker, pt reports having multiple falls a week. Pt performed bed mobility c/ CGA, sit<>stand c/ CGA from elevated EOB, sit<>stand minA chair, ambulation c/ CGA and cueing for AD navigation. Pt left in chair, needs within reach, alarm set. Pt reports dizziness sitting in chair at end of session, subsided quickly <1 min, nursing notified. Pt would benefit from skilled PT interventions to improve safe mobility, functional activity tolerance, maximize independence, and decrease falls risk.       Assistance Recommended at Discharge Frequent or constant Supervision/Assistance  If plan is discharge home, recommend the following:  Can travel by private vehicle  A lot of help with walking and/or transfers;A lot of help with bathing/dressing/bathroom;Assistance with cooking/housework;Assist for transportation;Help with stairs or ramp for entrance;Direct supervision/assist for medications management   Yes    Equipment Recommendations Other (comment) (TBD next venue of care)  Recommendations for Other Services       Functional Status Assessment Patient has had a recent decline in their functional status and demonstrates the ability to make significant improvements in function in a reasonable and predictable amount of time.     Precautions / Restrictions  Precautions Precautions: Fall Restrictions Weight Bearing Restrictions: No      Mobility  Bed Mobility Overal bed mobility: Needs Assistance Bed Mobility: Supine to Sit     Supine to sit: Min guard, HOB elevated          Transfers Overall transfer level: Needs assistance Equipment used: Rolling walker (2 wheels) Transfers: Sit to/from Stand Sit to Stand: Min guard, Min assist, From elevated surface           General transfer comment: minA sit<>stand chair, CGA sit<>stand EOB slightly elevated, cueing for hand placement    Ambulation/Gait Ambulation/Gait assistance: Min guard Gait Distance (Feet): 40 Feet Assistive device: Rolling walker (2 wheels) Gait Pattern/deviations: Step-through pattern, Decreased stride length, Decreased weight shift to right, Drifts right/left Gait velocity: decreased     General Gait Details: drifts L when talking, requires cueing for AD navigation, anxious throughout gait trial  Stairs            Wheelchair Mobility     Tilt Bed    Modified Rankin (Stroke Patients Only)       Balance Overall balance assessment: Needs assistance Sitting-balance support: Feet supported, No upper extremity supported Sitting balance-Leahy Scale: Good Sitting balance - Comments: able to Marquiz socks with extra time EOB   Standing balance support: Bilateral upper extremity supported, Reliant on assistive device for balance Standing balance-Leahy Scale: Fair Standing balance comment: able to stand with RW but relies heavily on UE support                             Pertinent Vitals/Pain Pain Assessment Pain Assessment: 0-10 Pain Score: 8  Pain Location:  R knee Pain Descriptors / Indicators: Aching, Dull, Discomfort Pain Intervention(s): Limited activity within patient's tolerance, Monitored during session, Repositioned    Home Living Family/patient expects to be discharged to:: Private residence Living Arrangements:  Spouse/significant other Available Help at Discharge: Family;Available PRN/intermittently Type of Home: Apartment Home Access: Ramped entrance       Home Layout: One level Home Equipment: Agricultural consultant (2 wheels);BSC/3in1;Transport chair;Wheelchair - manual Additional Comments: Pt lives with wife who has MS, she had a fall 1.5 years ago and is unable to take care of self. Pt is her primary caretaker    Prior Function Prior Level of Function : Independent/Modified Independent;Driving;History of Falls (last six months)             Mobility Comments: independent with mobility ADLs Comments: receives no help, takes care of wife with MS     Hand Dominance        Extremity/Trunk Assessment   Upper Extremity Assessment Upper Extremity Assessment: Generalized weakness    Lower Extremity Assessment Lower Extremity Assessment: Generalized weakness       Communication   Communication: No difficulties  Cognition Arousal/Alertness: Awake/alert Behavior During Therapy: Anxious Overall Cognitive Status: No family/caregiver present to determine baseline cognitive functioning                                 General Comments: emotional throughout session, requires motivation and reassurance to maximize effort        General Comments General comments (skin integrity, edema, etc.): tremors    Exercises     Assessment/Plan    PT Assessment Patient needs continued PT services  PT Problem List Decreased strength;Decreased cognition;Decreased range of motion;Decreased knowledge of use of DME;Decreased activity tolerance;Decreased safety awareness;Decreased balance;Decreased mobility;Decreased coordination       PT Treatment Interventions DME instruction;Balance training;Gait training;Neuromuscular re-education;Stair training;Functional mobility training;Patient/family education;Therapeutic activities;Therapeutic exercise    PT Goals (Current goals can be found in  the Care Plan section)  Acute Rehab PT Goals Patient Stated Goal: to return to home PT Goal Formulation: With patient Time For Goal Achievement: 06/09/23 Potential to Achieve Goals: Good    Frequency Min 1X/week     Co-evaluation               AM-PAC PT "6 Clicks" Mobility  Outcome Measure Help needed turning from your back to your side while in a flat bed without using bedrails?: None Help needed moving from lying on your back to sitting on the side of a flat bed without using bedrails?: None Help needed moving to and from a bed to a chair (including a wheelchair)?: A Little Help needed standing up from a chair using your arms (e.g., wheelchair or bedside chair)?: A Little Help needed to walk in hospital room?: A Little Help needed climbing 3-5 steps with a railing? : A Lot 6 Click Score: 19    End of Session Equipment Utilized During Treatment: Gait belt Activity Tolerance: Patient limited by fatigue;Patient limited by pain Patient left: in chair;with call bell/phone within reach;with chair alarm set Nurse Communication: Mobility status PT Visit Diagnosis: Unsteadiness on feet (R26.81);History of falling (Z91.81);Muscle weakness (generalized) (M62.81)    Time: 2841-3244 PT Time Calculation (min) (ACUTE ONLY): 28 min   Charges:   PT Evaluation $PT Eval Low Complexity: 1 Low PT Treatments $Therapeutic Activity: 23-37 mins PT General Charges $$ ACUTE PT VISIT: 1 Visit  Lala Lund, PT, SPT  12:31 PM,05/26/23

## 2023-05-26 NOTE — Progress Notes (Signed)
Progress Note   Patient: Miguel [name]:[id number] DOB: 03-23-1961 DOA: 05/24/2023     2 DOS: the patient was seen and examined on 05/26/2023     Subjective:  Pt awake, drowsy.  Continues to have tremors.  No nausea/vomiting, but still reports some right-sided abdominal pain.  Reports left knee pain from gout.  States he takes colchicine, but was out for a few weeks while staying at a hotel.  Says prednisone makes him confused, wants to avoid it.   Brief hospital course:  From HPI "[name] is a 62 year old male with history of alcohol abuse, anxiety, hypertension, depression, history of alcohol withdrawal, who presents to the emergency department for chief concerns of epigastric Vitals in the ED showed temperature 99.2, respiration rate of 19, heart rate of 100, blood pressure 170/84, SpO2 of 97% on room air. Serum sodium is 137, potassium is 3.9, chloride 100, bicarb 20, BUN of 12, serum creatinine 1.34, EGFR 60, nonfasting blood glucose 199, WBC 7.8, hemoglobin 12.8, platelets of 316."  Further hospital course and management as outlined below.   Assessment and Plan  Alcohol withdrawal with inpatient treatment  Recent CIWA scores -- 12 >> 11 >> 6 >> 6 Start scheduled Librium 10 mg TID CIWA monitoring PRN Ativan Fall precaution Seizure precaution  Acute Gout Flare - left knee Start colchicine - 1.2 mg today, 0.6 mg BID tomorrow Continue allopurinol Pt reports side effects from steroids, will avoid prednisone    Total bilirubin, elevated With right upper quadrant abdominal pain Abdominal ultrasound did not show any acute abdominal pathology Continue to monitor LFTs   Alcohol abuse With dependence Counseling given Continue CIWA protocol Continue thiamine 100 mg IV/p.o. daily ordered, multivitamin 1 tablet daily, folic acid 1 mg daily ordered Delirium precaution   AKI (acute kidney injury) (HCC)-improved Presented with creatinine of 1.34 which is now 0.9 Suspect  secondary to poor p.o. intake Patient is status post sodium chloride 1 L bolus via EDP Lactated ringer 125 mL/h, 1 day ordered Monitor renal function closely Avoid nephrotoxic medications   Generalized anxiety disorder Continue Home sertraline 50 mg daily resumed   Traumatic ecchymosis Mid lower back, and right mid inner thigh Present on admission, presumed secondary to falling in setting of resuming excessive alcohol intake They appear to be fading/healing PT OT consulted   Depression Home sertraline 50 mg daily resumed   Sinus tachycardia Patient multifactorial in setting of alcohol use with withdrawal and question compliance with home metoprolol Continue home metoprolol succinate 25 mg daily Metoprolol to tartrate 5 mg IV every 3 hours as needed for heart rate greater than 120, 3 doses ordered   Hypertension Continue Home metoprolol succinate 25 mg daily resumed      DVT prophylaxis: Enoxaparin  Code Status: Full code  Diet: Heart healthy diet   Admission status:  Status is: Inpatient Remains inpatient appropriate because: ongoing alcohol withdrawal requiring IV meds  Anticipated Dispo: may need SNF/rehab    Physical Exam: General exam: awake, appears drowsy, no acute distress, chronically ill appearing HEENT: moist mucus membranes, hearing grossly normal  Respiratory system: CTAB, no wheezes, rales or rhonchi, normal respiratory effort. Cardiovascular system: normal S1/S2, RRR, no JVD, murmurs, rubs, gallops,  no pedal edema.   Gastrointestinal system: soft, NT, ND, no HSM felt, +bowel sounds. Central nervous system: A&O x. no gross focal neurologic deficits, normal speech Extremities: tremor of outstretched hands, right knee warm to touch but not red Skin: dry, intact, normal temperature Psychiatry:  normal mood, congruent affect, judgement and insight appear normal     Data Reviewed: Notable labs --- glucose 122, Ca 8.4 otherwise normal BMP.  Hbg 10.6  stable.    Family Communication: No family present at bedside today   Time spent: 50 minutes spent reviewing patient's previous chart, imaging results, labs, vitals, ED physician documentation as well as taking care of patient      Vitals:   05/25/23 1853 05/25/23 2018 05/26/23 0501 05/26/23 0813  BP: (!) 171/89 (!) 146/86 132/67 133/73  Pulse: 86 96 86 88  Resp: 20  18 16   Temp: 98.4 F (36.9 C)  98.6 F (37 C) 98.6 F (37 C)  TempSrc: Oral  Oral Oral  SpO2: 100% 96% 94% 96%  Weight:      Height:           Author: Pennie Banter, DO 05/26/2023 12:33 PM  For on call review [email or web address].

## 2023-05-27 DIAGNOSIS — K7030 Alcoholic cirrhosis of liver without ascites: Secondary | ICD-10-CM | POA: Diagnosis not present

## 2023-05-27 DIAGNOSIS — F10930 Alcohol use, unspecified with withdrawal, uncomplicated: Secondary | ICD-10-CM

## 2023-05-27 DIAGNOSIS — F411 Generalized anxiety disorder: Secondary | ICD-10-CM | POA: Diagnosis not present

## 2023-05-27 LAB — CBC WITH DIFFERENTIAL/PLATELET
Abs Immature Granulocytes: 0.04 10*3/uL (ref 0.00–0.07)
Basophils Absolute: 0.1 10*3/uL (ref 0.0–0.1)
Basophils Relative: 1 %
Eosinophils Absolute: 0.2 10*3/uL (ref 0.0–0.5)
Eosinophils Relative: 3 %
HCT: 29.2 % — ABNORMAL LOW (ref 39.0–52.0)
Hemoglobin: 9.3 g/dL — ABNORMAL LOW (ref 13.0–17.0)
Immature Granulocytes: 1 %
Lymphocytes Relative: 18 %
Lymphs Abs: 1.5 10*3/uL (ref 0.7–4.0)
MCH: 28.1 pg (ref 26.0–34.0)
MCHC: 31.8 g/dL (ref 30.0–36.0)
MCV: 88.2 fL (ref 80.0–100.0)
Monocytes Absolute: 1.2 10*3/uL — ABNORMAL HIGH (ref 0.1–1.0)
Monocytes Relative: 14 %
Neutro Abs: 5.3 10*3/uL (ref 1.7–7.7)
Neutrophils Relative %: 63 %
Platelets: 194 10*3/uL (ref 150–400)
RBC: 3.31 MIL/uL — ABNORMAL LOW (ref 4.22–5.81)
RDW: 14.6 % (ref 11.5–15.5)
WBC: 8.2 10*3/uL (ref 4.0–10.5)
nRBC: 0 % (ref 0.0–0.2)

## 2023-05-27 MED ORDER — HYDROCODONE-ACETAMINOPHEN 5-325 MG PO TABS
1.0000 | ORAL_TABLET | Freq: Four times a day (QID) | ORAL | Status: DC | PRN
  Administered 2023-05-27 (×3): 1 via ORAL
  Administered 2023-05-28: 2 via ORAL
  Administered 2023-05-28: 1 via ORAL
  Administered 2023-05-28: 2 via ORAL
  Administered 2023-05-29: 1 via ORAL
  Administered 2023-05-29 (×2): 2 via ORAL
  Administered 2023-05-29 – 2023-05-30 (×3): 1 via ORAL
  Administered 2023-05-30 – 2023-06-01 (×9): 2 via ORAL
  Filled 2023-05-27: qty 2
  Filled 2023-05-27: qty 1
  Filled 2023-05-27: qty 2
  Filled 2023-05-27: qty 1
  Filled 2023-05-27 (×2): qty 2
  Filled 2023-05-27: qty 1
  Filled 2023-05-27 (×2): qty 2
  Filled 2023-05-27 (×2): qty 1
  Filled 2023-05-27 (×3): qty 2
  Filled 2023-05-27: qty 1
  Filled 2023-05-27 (×6): qty 2

## 2023-05-27 MED ORDER — CHLORDIAZEPOXIDE HCL 25 MG PO CAPS
25.0000 mg | ORAL_CAPSULE | Freq: Three times a day (TID) | ORAL | Status: DC
  Administered 2023-05-27 – 2023-05-29 (×8): 25 mg via ORAL
  Filled 2023-05-27 (×8): qty 1

## 2023-05-27 MED ORDER — ZOLPIDEM TARTRATE 5 MG PO TABS: 10.0000 mg | ORAL_TABLET | Freq: Every evening | ORAL | Status: DC | PRN

## 2023-05-27 NOTE — TOC Progression Note (Signed)
Transition of Care [facility]) - Progression Note    Patient Details  Name: TERI DILTZ MRN: [id number] Date of Birth: 1961-01-10  [facility]) CM/SW Contact  Chapman Fitch, RN Phone Number: 05/27/2023, 11:25 AM  Clinical Narrative:        Therapy recommending SNF Discussed recs with patient Patient states that he plans to return home with wife Patient denies the need for any DME at home.   Patient is agreeable to home health services if he has benefit coverage  Benefit check submitted to [facility]  Per [facility] rep there are in network therapy services in [place], [place], [place]     Per [facility], [facility], [facility], [facility], [facility], [facility], and [facility] they are not in network with insurance     Expected Discharge Plan and Services                                               Social Determinants of Health (SDOH) Interventions SDOH Screenings   Food Insecurity: No Food Insecurity (05/24/2023)  Housing: Low Risk  (05/24/2023)  Transportation Needs: No Transportation Needs (05/24/2023)  Utilities: Not At Risk (05/24/2023)  Alcohol Screen: Medium Risk (03/19/2021)  Depression (PHQ2-9): Low Risk  (04/16/2023)  Financial Resource Strain: High Risk (09/18/2020)  Physical Activity: Sufficiently Active (09/18/2020)  Social Connections: Socially Isolated (09/18/2020)  Stress: Stress Concern Present (09/18/2020)  Tobacco Use: Medium Risk (05/24/2023)    Readmission Risk Interventions    05/25/2023    4:31 PM 01/19/2023   12:40 PM  Readmission Risk Prevention Plan  Transportation Screening Complete Complete  Medication Review (RN Care Manager) Referral to Pharmacy Complete  PCP or Specialist appointment within 3-5 days of discharge Complete Complete  HRI or Home Care Consult Complete   SW Recovery Care/Counseling Consult Complete Complete  Palliative Care Screening Not Applicable Not Applicable  Skilled Nursing Facility Not Applicable Not Applicable

## 2023-05-27 NOTE — Progress Notes (Signed)
Progress Note   Patient: Miguel [name] OZD:[id number] DOB: 1961-07-22 DOA: 05/24/2023     3 DOS: the patient was seen and examined on 05/27/2023     Brief hospital course:  From HPI "[name] is a 62 year old male with history of alcohol abuse, anxiety, hypertension, depression, history of alcohol withdrawal, who presents to the emergency department for chief concerns of epigastric Vitals in the ED showed temperature 99.2, respiration rate of 19, heart rate of 100, blood pressure 170/84, SpO2 of 97% on room air. Serum sodium is 137, potassium is 3.9, chloride 100, bicarb 20, BUN of 12, serum creatinine 1.34, EGFR 60, nonfasting blood glucose 199, WBC 7.8, hemoglobin 12.8, platelets of 316."  Further hospital course and management as outlined below.   Assessment and Plan  Alcohol withdrawal with inpatient treatment  Recent CIWA scores -- 1 >> 6 >> 1 >> 9 7/11 - Has ongoing significant tremors Started scheduled Librium 10 mg TID (7/10) Increase Librium to 25 mg TID (7/11) CIWA monitoring PRN Ativan Fall precaution Seizure precaution  Acute Gout Flare - left knee Started colchicine - 1.2 mg on 7/10 Continue colchicine 0.6 mg BID  IV Toradol, PRN Norco Continue allopurinol Pt reports side effects from steroids, will avoid prednisone   Normocytic anemia HBG on admission was 12.8 >> 10.8 >> 10.6 >> 9.3 No signs of bleeding.  Normal iron studies, B12 and folate level in 2023/01/07. Suspect myelosuppression related to alcohol abuse. Will repeat anemia panel with a.m. labs Monitor CBC   Total bilirubin, elevated With right upper quadrant abdominal pain Abdominal ultrasound did not show any acute abdominal pathology Continue to monitor LFTs   Alcohol abuse With dependence Counseling given Continue CIWA protocol Continue thiamine 100 mg IV/p.o. daily ordered, multivitamin 1 tablet daily, folic acid 1 mg daily ordered Delirium precaution   AKI (acute kidney injury)  (HCC)-improved Presented with creatinine of 1.34 which is now 0.9 Suspect secondary to poor p.o. intake Patient is status post sodium chloride 1 L bolus via EDP Lactated ringer 125 mL/h, 1 day ordered Monitor renal function closely Avoid nephrotoxic medications   Generalized anxiety disorder Continue Home sertraline 50 mg daily resumed   Traumatic ecchymosis Mid lower back, and right mid inner thigh Present on admission, presumed secondary to falling in setting of resuming excessive alcohol intake They appear to be fading/healing PT OT consulted   Depression Home sertraline 50 mg daily resumed   Sinus tachycardia Patient multifactorial in setting of alcohol use with withdrawal and question compliance with home metoprolol Continue home metoprolol succinate 25 mg daily Metoprolol to tartrate 5 mg IV every 3 hours as needed for heart rate greater than 120, 3 doses ordered   Hypertension Continue Home metoprolol succinate 25 mg daily resumed      DVT prophylaxis: Enoxaparin  Code Status: Full code  Diet: Heart healthy diet   Admission status:  Status is: Inpatient Remains inpatient appropriate because: ongoing alcohol withdrawal requiring IV meds  Anticipated Dispo: may need SNF/rehab    Subjective / Interval history:  Pt awake sitting up in bed this morning.  He has ongoing severe right knee pain that was significantly exacerbated after working with therapy this morning.  Request pain medication after no relief from IV Toradol and colchicine given earlier.  He reports ongoing tremors.  Other acute complaints.    Physical Exam: General exam: awake, alert, no acute distress, chronically ill appearing HEENT: moist mucus membranes, hearing grossly normal  Respiratory system: CTAB, no  wheezes, rales or rhonchi, normal respiratory effort. Cardiovascular system: normal S1/S2, RRR, no peripheral edema Gastrointestinal system: soft, NT, ND Central nervous system: A&O x. no  gross focal neurologic deficits, normal speech Extremities: tremulous hands, right knee warm to touch but not red Skin: dry, intact, normal temperature Psychiatry: normal mood, congruent affect, judgement and insight appear normal     Data Reviewed: Notable labs --- Hbg 9.3 from 10.6    Family Communication: No family present at bedside    Time spent: 42 minutes      Vitals:   05/26/23 1704 05/26/23 1911 05/27/23 0333 05/27/23 0746  BP: (!) 142/81 (!) 140/81 (!) 140/103 (!) 145/86  Pulse: 90 88 76 73  Resp: 16 16 16 18   Temp: 98.3 F (36.8 C) 98.4 F (36.9 C) 98.1 F (36.7 C) 97.6 F (36.4 C)  TempSrc: Oral  Oral Oral  SpO2: 95% 96% 97% 94%  Weight:      Height:           Author: Pennie Banter, DO 05/27/2023 12:54 PM  For on call review [email or web address].

## 2023-05-27 NOTE — Plan of Care (Signed)

## 2023-05-27 NOTE — Progress Notes (Signed)
OT Cancellation Note  Patient Details Name: Miguel [name] MRN: [id number] DOB: November 14, 1961   Cancelled Treatment:    Reason Eval/Treat Not Completed: Other (comment). Pt working with PT upon attempt earlier this afternoon. Will re-attempt as pt is available.   Arman Filter., MPH, MS, OTR/L ascom [phone] 05/27/23, 2:52 PM

## 2023-05-27 NOTE — Plan of Care (Signed)
  Problem: Clinical Measurements: Goal: Will remain free from infection Outcome: Progressing Goal: Respiratory complications will improve Outcome: Progressing Goal: Cardiovascular complication will be avoided Outcome: Progressing   Problem: Elimination: Goal: Will not experience complications related to bowel motility Outcome: Progressing Goal: Will not experience complications related to urinary retention Outcome: Progressing   

## 2023-05-27 NOTE — Progress Notes (Addendum)
Physical Therapy Treatment Patient Details Name: Miguel [name] MRN: [id number] DOB: 15-Jun-1961 Today's Date: 05/27/2023   History of Present Illness Pt is a 62 y/o M who presents with chest and right upper quadrant pain, attributed to alcohol abuse and AKI, counseling provided and medication. PMH significant for alcohol abuse, HTN, depression, anxiety, GERD, gout, OCD    PT Comments  Pt presents alert and laying in bed. Initial pain reports are 9/10 in R knee due to gout flare, but was willing to attempt mobility. Pt able to perform supine>sit with supervision, requiring increased time due to general limited mobility, pain, and ensure patient safety. He was able to sit<> stand and use lateral stepping to transfer to recliner with RW and minA, needing extra time and facilitation for upright posturing during mobility. Overall standing balance is fair, with extreme forward flexed posturing when initially standing, but able to self correct with extra time. Pt able to ambulate ~26ft with minA and RW, gait analysis revealing antalgic gait patterns and decreased stance time on the R leg. Pt reports walking is feeling better than it did yesterday. He was able to perform seated LAQ and marches x10 B/L at the end of session and educated on promoting regular mobility within pain free range.  Pt also reported intermittent dizziness with mobility that quickly resolved with seated rest, SPO2 and HR were monitored throughout session and responded appropriately to activity. Pt would benefit from continued skilled care to address current mobility deficits and maximize functional independence.     Assistance Recommended at Discharge Frequent or constant Supervision/Assistance  If plan is discharge home, recommend the following:  Can travel by private vehicle    A lot of help with walking and/or transfers;Assistance with cooking/housework;Assist for transportation;Help with stairs or ramp for entrance;Direct  supervision/assist for medications management;Direct supervision/assist for financial management;A lot of help with bathing/dressing/bathroom   Yes  Equipment Recommendations       Recommendations for Other Services       Precautions / Restrictions Precautions Precautions: Fall Restrictions Weight Bearing Restrictions: No     Mobility  Bed Mobility Overal bed mobility: Needs Assistance Bed Mobility: Supine to Sit     Supine to sit: Supervision          Transfers Overall transfer level: Needs assistance Equipment used: Rolling walker (2 wheels) Transfers: Sit to/from Stand, Bed to chair/wheelchair/BSC Sit to Stand: Min assist           General transfer comment: Required extra time and facilitation of upright posturing    Ambulation/Gait Ambulation/Gait assistance: Min assist Gait Distance (Feet): 40 Feet Assistive device: Rolling walker (2 wheels) Gait Pattern/deviations: Decreased stride length, Antalgic, Decreased stance time - right, Decreased weight shift to right       General Gait Details: significant gout related pain on R knee prior to and during ambulation   Stairs             Wheelchair Mobility     Tilt Bed    Modified Rankin (Stroke Patients Only)       Balance Overall balance assessment: Needs assistance Sitting-balance support: No upper extremity supported, Feet supported Sitting balance-Leahy Scale: Good     Standing balance support: Bilateral upper extremity supported, Reliant on assistive device for balance Standing balance-Leahy Scale: Fair Standing balance comment: extreme forward flexed posture upon initial standing, able to correct with extra time without cueing  Cognition Arousal/Alertness: Awake/alert Behavior During Therapy: WFL for tasks assessed/performed Overall Cognitive Status: No family/caregiver present to determine baseline cognitive functioning                                           Exercises General Exercises - Lower Extremity Long Arc Quad: AROM, Both, 10 reps Hip Flexion/Marching: AROM, Both, 10 reps    General Comments General comments (skin integrity, edema, etc.): Tremors continue to be present and increased during activity. Pt complained of dizziness following bouts of activity that quickly resolve upon sitting. SPO2 ranged from 98-99 and HR ranged from 81-90 prior to, during, and following activity. Vitals responded accordingly to activity.      Pertinent Vitals/Pain Pain Assessment Pain Location: R knee Pain Descriptors / Indicators: Aching, Constant, Discomfort, Grimacing Pain Intervention(s): Monitored during session, Repositioned    Home Living                          Prior Function            PT Goals (current goals can now be found in the care plan section) Acute Rehab PT Goals Patient Stated Goal: to return to home PT Goal Formulation: With patient Time For Goal Achievement: 06/09/23 Potential to Achieve Goals: Good Progress towards PT goals: Progressing toward goals    Frequency    Min 1X/week      PT Plan      Co-evaluation              AM-PAC PT "6 Clicks" Mobility   Outcome Measure  Help needed turning from your back to your side while in a flat bed without using bedrails?: None Help needed moving from lying on your back to sitting on the side of a flat bed without using bedrails?: None Help needed moving to and from a bed to a chair (including a wheelchair)?: A Little Help needed standing up from a chair using your arms (e.g., wheelchair or bedside chair)?: A Little Help needed to walk in hospital room?: A Lot Help needed climbing 3-5 steps with a railing? : A Lot 6 Click Score: 18    End of Session Equipment Utilized During Treatment: Gait belt Activity Tolerance: Patient limited by pain Patient left: in chair;with call bell/phone within reach;with chair alarm  set   PT Visit Diagnosis: Unsteadiness on feet (R26.81);History of falling (Z91.81);Muscle weakness (generalized) (M62.81)     Time: 1353-1430 PT Time Calculation (min) (ACUTE ONLY): 37 min  Charges:                            Blenda Nicely, PT, SPT 3:06 PM,05/27/23

## 2023-05-28 ENCOUNTER — Ambulatory Visit (INDEPENDENT_AMBULATORY_CARE_PROVIDER_SITE_OTHER): Payer: Self-pay | Admitting: Family Medicine

## 2023-05-28 DIAGNOSIS — E611 Iron deficiency: Secondary | ICD-10-CM | POA: Diagnosis present

## 2023-05-28 DIAGNOSIS — K7030 Alcoholic cirrhosis of liver without ascites: Secondary | ICD-10-CM | POA: Diagnosis not present

## 2023-05-28 DIAGNOSIS — Z91199 Patient's noncompliance with other medical treatment and regimen due to unspecified reason: Secondary | ICD-10-CM

## 2023-05-28 DIAGNOSIS — F10930 Alcohol use, unspecified with withdrawal, uncomplicated: Secondary | ICD-10-CM | POA: Diagnosis not present

## 2023-05-28 LAB — IRON AND TIBC
Iron: 17 ug/dL — ABNORMAL LOW (ref 45–182)
Saturation Ratios: 7 % — ABNORMAL LOW (ref 17.9–39.5)
TIBC: 252 ug/dL (ref 250–450)
UIBC: 235 ug/dL

## 2023-05-28 LAB — CBC WITH DIFFERENTIAL/PLATELET
Abs Immature Granulocytes: 0.04 10*3/uL (ref 0.00–0.07)
Basophils Absolute: 0 10*3/uL (ref 0.0–0.1)
Basophils Relative: 1 %
Eosinophils Absolute: 0.4 10*3/uL (ref 0.0–0.5)
Eosinophils Relative: 5 %
HCT: 31.3 % — ABNORMAL LOW (ref 39.0–52.0)
Hemoglobin: 10.2 g/dL — ABNORMAL LOW (ref 13.0–17.0)
Immature Granulocytes: 1 %
Lymphocytes Relative: 17 %
Lymphs Abs: 1.4 10*3/uL (ref 0.7–4.0)
MCH: 28.5 pg (ref 26.0–34.0)
MCHC: 32.6 g/dL (ref 30.0–36.0)
MCV: 87.4 fL (ref 80.0–100.0)
Monocytes Absolute: 1.1 10*3/uL — ABNORMAL HIGH (ref 0.1–1.0)
Monocytes Relative: 14 %
Neutro Abs: 5.2 10*3/uL (ref 1.7–7.7)
Neutrophils Relative %: 62 %
Platelets: 217 10*3/uL (ref 150–400)
RBC: 3.58 MIL/uL — ABNORMAL LOW (ref 4.22–5.81)
RDW: 14.7 % (ref 11.5–15.5)
WBC: 8.2 10*3/uL (ref 4.0–10.5)
nRBC: 0 % (ref 0.0–0.2)

## 2023-05-28 LAB — RETICULOCYTES
Immature Retic Fract: 28.5 % — ABNORMAL HIGH (ref 2.3–15.9)
RBC.: 3.61 MIL/uL — ABNORMAL LOW (ref 4.22–5.81)
Retic Count, Absolute: 78 10*3/uL (ref 19.0–186.0)
Retic Ct Pct: 2.2 % (ref 0.4–3.1)

## 2023-05-28 LAB — FERRITIN: Ferritin: 73 ng/mL (ref 24–336)

## 2023-05-28 LAB — VITAMIN B12: Vitamin B-12: 212 pg/mL (ref 180–914)

## 2023-05-28 LAB — FOLATE: Folate: 13.6 ng/mL (ref >5.9)

## 2023-05-28 MED ORDER — LORAZEPAM 1 MG PO TABS
1.0000 mg | ORAL_TABLET | ORAL | Status: AC | PRN
  Administered 2023-05-28: 1 mg via ORAL
  Administered 2023-05-30 – 2023-05-31 (×2): 2 mg via ORAL
  Filled 2023-05-28 (×2): qty 2
  Filled 2023-05-28: qty 1

## 2023-05-28 NOTE — Progress Notes (Signed)
Patient was not seen for appt d/t no call, no show, or late arrival >10 mins past appt time.   Zachariah Pavek T Aundrea Higginbotham, FNP  [facility] [address] , [place] [address] [phone] (phone) [phone] (fax) [facility]  

## 2023-05-28 NOTE — Plan of Care (Signed)
  Problem: Clinical Measurements: Goal: Ability to maintain clinical measurements within normal limits will improve Outcome: Progressing Goal: Respiratory complications will improve Outcome: Adequate for Discharge Goal: Cardiovascular complication will be avoided Outcome: Adequate for Discharge   Problem: Activity: Goal: Risk for activity intolerance will decrease Outcome: Not Progressing

## 2023-05-28 NOTE — Progress Notes (Signed)
Occupational Therapy Treatment Patient Details Name: Miguel [name] MRN: [id number] DOB: July 06, 1961 Today's Date: 05/28/2023   History of present illness Pt is a 62 y/o M who presents with chest and right upper quadrant pain, attributed to alcohol abuse and AKI, counseling provided and medication. PMH significant for alcohol abuse, HTN, depression, anxiety, GERD, gout, OCD   OT comments  Pt seen for OT tx. Pt endorsing 8/10 R knee pain throughout session. RN notified. Pt completed bed mobility with supervision and notable effort. MIN A for STS from EOB with RW. VC for hand placement and to scoot forward prior to standing to improve technique. Pt marched in place briefly, limited by R knee pain. Plan to walk to the sink for grooming was deferred 2/2 R knee pain and instead pt set up for grooming tasks seated in recliner. HR and SpO2 stable throughout. Continues to benefit from skilled OT services.    Recommendations for follow up therapy are one component of a multi-disciplinary discharge planning process, led by the attending physician.  Recommendations may be updated based on patient status, additional functional criteria and insurance authorization.    Assistance Recommended at Discharge Frequent or constant Supervision/Assistance  Patient can return home with the following  A little help with walking and/or transfers;A little help with bathing/dressing/bathroom;Assistance with cooking/housework;Direct supervision/assist for financial management;Direct supervision/assist for medications management;Assist for transportation;Help with stairs or ramp for entrance   Equipment Recommendations  BSC/3in1    Recommendations for Other Services      Precautions / Restrictions Precautions Precautions: Fall Restrictions Weight Bearing Restrictions: No       Mobility Bed Mobility Overal bed mobility: Needs Assistance Bed Mobility: Supine to Sit     Supine to sit: Supervision     General bed  mobility comments: increased effort    Transfers Overall transfer level: Needs assistance Equipment used: Rolling walker (2 wheels) Transfers: Sit to/from Stand, Bed to chair/wheelchair/BSC Sit to Stand: Min assist     Step pivot transfers: Min guard     General transfer comment: incr time/effort, R knee pain limiting mobility     Balance Overall balance assessment: Needs assistance   Sitting balance-Leahy Scale: Good     Standing balance support: Bilateral upper extremity supported, Reliant on assistive device for balance Standing balance-Leahy Scale: Fair                             ADL either performed or assessed with clinical judgement   ADL Overall ADL's : Needs assistance/impaired     Grooming: Sitting;Set up;Wash/dry face;Oral care Grooming Details (indicate cue type and reason): Pt was going to stand at sink to complete but R knee pain limited this option, so instead set up seated in recliner for tasks.                                    Extremity/Trunk Assessment              Vision       Perception     Praxis      Cognition Arousal/Alertness: Awake/alert Behavior During Therapy: WFL for tasks assessed/performed Overall Cognitive Status: No family/caregiver present to determine baseline cognitive functioning  General Comments: decr safety awareness and slightly slow processing but able to follow commands well        Exercises      Shoulder Instructions       General Comments      Pertinent Vitals/ Pain       Pain Assessment Pain Assessment: 0-10 Pain Score: 8  Pain Location: R knee Pain Descriptors / Indicators: Aching, Constant, Discomfort, Grimacing Pain Intervention(s): Limited activity within patient's tolerance, Monitored during session, Premedicated before session, Repositioned  Home Living                                           Prior Functioning/Environment              Frequency  Min 1X/week        Progress Toward Goals  OT Goals(current goals can now be found in the care plan section)     Acute Rehab OT Goals Patient Stated Goal: get better OT Goal Formulation: With patient Time For Goal Achievement: 06/09/23 Potential to Achieve Goals: Good  Plan      Co-evaluation                 AM-PAC OT "6 Clicks" Daily Activity     Outcome Measure   Help from another person eating meals?: None Help from another person taking care of personal grooming?: A Little Help from another person toileting, which includes using toliet, bedpan, or urinal?: A Little Help from another person bathing (including washing, rinsing, drying)?: A Little Help from another person to put on and taking off regular upper body clothing?: None Help from another person to put on and taking off regular lower body clothing?: A Little 6 Click Score: 20    End of Session Equipment Utilized During Treatment: Gait belt;Rolling walker (2 wheels)  OT Visit Diagnosis: Unsteadiness on feet (R26.81);Repeated falls (R29.6);Muscle weakness (generalized) (M62.81)   Activity Tolerance Patient limited by pain   Patient Left in chair;with call bell/phone within reach;with chair alarm set   Nurse Communication Mobility status;Patient requests pain meds;Other (comment) (chair alarm needs new batteries)        Time: 1027-2536 OT Time Calculation (min): 24 min  Charges: OT General Charges $OT Visit: 1 Visit OT Treatments $Self Care/Home Management : 23-37 mins  Arman Filter., MPH, MS, OTR/L ascom [phone] 05/28/23, 11:57 AM

## 2023-05-28 NOTE — Plan of Care (Signed)
  Problem: Education: Goal: Knowledge of General Education information will improve Description: Including pain rating scale, medication(s)/side effects and non-pharmacologic comfort measures Outcome: Progressing   Problem: Clinical Measurements: Goal: Ability to maintain clinical measurements within normal limits will improve Outcome: Progressing Goal: Will remain free from infection Outcome: Progressing Goal: Cardiovascular complication will be avoided Outcome: Progressing   Problem: Nutrition: Goal: Adequate nutrition will be maintained Outcome: Progressing   Problem: Pain Managment: Goal: General experience of comfort will improve Outcome: Progressing   

## 2023-05-28 NOTE — Progress Notes (Signed)
Progress Note   Patient: Miguel [name] UJW:[id number] DOB: 06/09/1961 DOA: 05/24/2023     4 DOS: the patient was seen and examined on 05/28/2023     Brief hospital course:  From HPI "[name] is a 62 year old male with history of alcohol abuse, anxiety, hypertension, depression, history of alcohol withdrawal, who presents to the emergency department for chief concerns of epigastric Vitals in the ED showed temperature 99.2, respiration rate of 19, heart rate of 100, blood pressure 170/84, SpO2 of 97% on room air. Serum sodium is 137, potassium is 3.9, chloride 100, bicarb 20, BUN of 12, serum creatinine 1.34, EGFR 60, nonfasting blood glucose 199, WBC 7.8, hemoglobin 12.8, platelets of 316."  Further hospital course and management as outlined below.   Assessment and Plan  Alcohol withdrawal with inpatient treatment  Recent CIWA scores -- 9 >> 4 >> 4 7/11 - Has ongoing significant tremors Started scheduled Librium 10 mg TID (7/10) Continue Librium to 25 mg TID (increased on 7/11) CIWA monitoring + PRN Ativan Fall precaution Seizure precaution  Acute Gout Flare - left knee Started colchicine - 1.2 mg on 7/10 Continue colchicine 0.6 mg BID  IV Toradol, PRN Norco Continue allopurinol Pt reports side effects from steroids, will avoid prednisone   Normocytic anemia / Iron deficiency anemia HBG on admission was 12.8 >> 10.8 >> 10.6 >> 9.3 >> 10.3 No signs of bleeding.  Normal iron studies, B12 and folate level in Jan 12, 2023. Suspect myelosuppression related to alcohol abuse. Anemia panel shows iron deficiency, normal folate & b12 Monitor CBC   Total bilirubin, elevated With right upper quadrant abdominal pain Abdominal ultrasound did not show any acute abdominal pathology Continue to monitor LFTs   Alcohol abuse With dependence Counseling given Continue CIWA protocol Continue thiamine 100 mg IV/p.o. daily ordered, multivitamin 1 tablet daily, folic acid 1 mg daily  ordered Delirium precaution   AKI (acute kidney injury) (HCC)-improved Presented with creatinine of 1.34 which is now 0.9 Suspect secondary to poor p.o. intake Patient is status post sodium chloride 1 L bolus via EDP Lactated ringer 125 mL/h, 1 day ordered Monitor renal function closely Avoid nephrotoxic medications   Generalized anxiety disorder Continue Home sertraline 50 mg daily resumed   Traumatic ecchymosis Mid lower back, and right mid inner thigh Present on admission, presumed secondary to falling in setting of resuming excessive alcohol intake They appear to be fading/healing PT OT consulted   Depression Home sertraline 50 mg daily resumed   Sinus tachycardia Patient multifactorial in setting of alcohol use with withdrawal and question compliance with home metoprolol Continue home metoprolol succinate 25 mg daily Metoprolol to tartrate 5 mg IV every 3 hours as needed for heart rate greater than 120, 3 doses ordered   Hypertension Continue Home metoprolol succinate 25 mg daily resumed      DVT prophylaxis: Enoxaparin  Code Status: Full code  Diet: Heart healthy diet   Admission status:  Status is: Inpatient Remains inpatient appropriate because: ongoing alcohol withdrawal requiring IV meds  Anticipated Dispo: may need SNF/rehab    Subjective / Interval history:  Pt awake sitting up in bed this morning.  Still has tremors, but little better today. Right knee still painful.  No fever or chills.    Physical Exam: General exam: awake, alert, no acute distress, chronically ill appearing HEENT: moist mucus membranes, hearing grossly normal  Respiratory system: CTAB, no wheezes, rales or rhonchi, normal respiratory effort. Cardiovascular system: normal S1/S2, RRR, no peripheral edema  Gastrointestinal system: soft, NT, ND Central nervous system: A&O x. no gross focal neurologic deficits, normal speech Extremities: tremulous hands somewhat improved since  yesterday, right knee warm to touch but not red or swollen Skin: dry, intact, normal temperature Psychiatry: normal mood, congruent affect, judgement and insight appear normal     Data Reviewed: Notable labs --- Hbg 10.2 improved from 9.3  Anemia panel --  Iron low 17, TIBC normal 252, sat ratio low 7%, normal ferritin 73 Normal folate and b12    Family Communication: No family present at bedside    Time spent: 42 minutes      Vitals:   05/27/23 0746 05/27/23 1529 05/27/23 2048 05/28/23 0518  BP: (!) 145/86 117/73 (!) 149/83 (!) 158/96  Pulse: 73 80 76 70  Resp: 18 18 19 18   Temp: 97.6 F (36.4 C) 98.3 F (36.8 C) 98.1 F (36.7 C) 97.8 F (36.6 C)  TempSrc: Oral Oral    SpO2: 94% 95% 96% 98%  Weight:      Height:           Author: Pennie Banter, DO 05/28/2023 2:53 PM  For on call review [email or web address].

## 2023-05-28 NOTE — TOC Progression Note (Signed)
Transition of Care [facility]) - Progression Note    Patient Details  Name: DAQUAWN HAGEDORN MRN: [id number] Date of Birth: 1961/07/13  Transition of Care [facility] CM/SW Contact  Chapman Fitch, RN Phone Number: 05/28/2023, 4:06 PM  Clinical Narrative:        Patient notified that [facility] was unable to secure home health services.    He states "I'm doing pretty good, I think ill be fine with out it" Patient confirms he has a BSC and RW at home  Expected Discharge Plan and Services                                               Social Determinants of Health (SDOH) Interventions SDOH Screenings   Food Insecurity: No Food Insecurity (05/24/2023)  Housing: Low Risk  (05/24/2023)  Transportation Needs: No Transportation Needs (05/24/2023)  Utilities: Not At Risk (05/24/2023)  Alcohol Screen: Medium Risk (03/19/2021)  Depression (PHQ2-9): Low Risk  (04/16/2023)  Financial Resource Strain: High Risk (09/18/2020)  Physical Activity: Sufficiently Active (09/18/2020)  Social Connections: Socially Isolated (09/18/2020)  Stress: Stress Concern Present (09/18/2020)  Tobacco Use: Medium Risk (05/24/2023)    Readmission Risk Interventions    05/25/2023    4:31 PM 01/19/2023   12:40 PM  Readmission Risk Prevention Plan  Transportation Screening Complete Complete  Medication Review (RN Care Manager) Referral to Pharmacy Complete  PCP or Specialist appointment within 3-5 days of discharge Complete Complete  HRI or Home Care Consult Complete   SW Recovery Care/Counseling Consult Complete Complete  Palliative Care Screening Not Applicable Not Applicable  Skilled Nursing Facility Not Applicable Not Applicable

## 2023-05-28 NOTE — Progress Notes (Addendum)
Physical Therapy Treatment Patient Details Name: Miguel [name] MRN: [id number] DOB: 1961-08-20 Today's Date: 05/28/2023   History of Present Illness Pt is a 62 y/o M who presents with chest and right upper quadrant pain, attributed to alcohol abuse and AKI, counseling provided and medication. PMH significant for alcohol abuse, HTN, depression, anxiety, GERD, gout, OCD    PT Comments  Pt presented seated in recliner and 8/10 knee pain due to gout flare. Orthostatics were assessed prior to mobilization due to continued complaints of intermittent dizziness, but results were unremarkable. SPO2 and HR continue to respond appropriately to activity. He was able to transfer from recliner<> toilet with CGA with noteable improvements in time and upright posturing upon standing. Antalgic gait pattern and decreased weight shifting to R side continue to be present. Pt's activity tolerance continues to be limited by R knee pain, but shows slight improvements in distance and pain control compared to previous visits.   PT also educated on discharge options, noting current deficits from baseline, safe return home, and potential for improvement with continued therapy. Pt seemed open to the discussed d/c plan. Pt would benefit from continued PT to maximize functional abilities.     Assistance Recommended at Discharge Frequent or constant Supervision/Assistance  If plan is discharge home, recommend the following:  Can travel by private vehicle    A lot of help with walking and/or transfers;Assistance with cooking/housework;Assist for transportation;Help with stairs or ramp for entrance;Direct supervision/assist for medications management;Direct supervision/assist for financial management;A lot of help with bathing/dressing/bathroom   Yes  Equipment Recommendations  Rolling walker (2 wheels)    Recommendations for Other Services       Precautions / Restrictions Precautions Precautions:  Fall Restrictions Weight Bearing Restrictions: No     Mobility  Bed Mobility Overal bed mobility: Needs Assistance Bed Mobility: Sit to Supine       Sit to supine: Supervision   General bed mobility comments: required increased time for LE placement on bed    Transfers Overall transfer level: Needs assistance Equipment used: Rolling walker (2 wheels) Transfers: Sit to/from Stand Sit to Stand: Min assist           General transfer comment: Slightly improved upright posturing since previous PT visit. Continues to require increased time for mobility    Ambulation/Gait Ambulation/Gait assistance: Min guard   Assistive device: Rolling walker (2 wheels) Gait Pattern/deviations: Antalgic, Decreased weight shift to right, Decreased stance time - right       General Gait Details: R knee pain continues to limit activity tolerance with ambulation   Stairs             Wheelchair Mobility     Tilt Bed    Modified Rankin (Stroke Patients Only)       Balance Overall balance assessment: Needs assistance Sitting-balance support: No upper extremity supported, Feet supported Sitting balance-Leahy Scale: Good     Standing balance support: Bilateral upper extremity supported, Reliant on assistive device for balance Standing balance-Leahy Scale: Fair Standing balance comment: improved forward flexed posturing upon standing, heavily relies on walker to offload R knee                            Cognition Arousal/Alertness: Awake/alert Behavior During Therapy: WFL for tasks assessed/performed Overall Cognitive Status: Within Functional Limits for tasks assessed  Exercises General Exercises - Lower Extremity Hip Flexion/Marching: AROM, Both, 10 reps    General Comments General comments (skin integrity, edema, etc.): Orthostatics recorded due to continued dizziness following activity, BP  recording was ____ in sitting and ____ in standing. SPO2 and HR continue to respond appropriately to activity      Pertinent Vitals/Pain Pain Assessment Pain Assessment: 0-10 Pain Score: 8  Pain Location: R Knee, pain did not change with activity Pain Descriptors / Indicators: Aching, Constant, Discomfort, Grimacing Pain Intervention(s): Limited activity within patient's tolerance, Monitored during session, Repositioned    Home Living                          Prior Function            PT Goals (current goals can now be found in the care plan section) Acute Rehab PT Goals Patient Stated Goal: to return to home PT Goal Formulation: With patient Time For Goal Achievement: 06/09/23 Potential to Achieve Goals: Good Progress towards PT goals: Progressing toward goals    Frequency    Min 1X/week      PT Plan Current plan remains appropriate    Co-evaluation              AM-PAC PT "6 Clicks" Mobility   Outcome Measure  Help needed turning from your back to your side while in a flat bed without using bedrails?: None Help needed moving from lying on your back to sitting on the side of a flat bed without using bedrails?: None Help needed moving to and from a bed to a chair (including a wheelchair)?: A Little Help needed standing up from a chair using your arms (e.g., wheelchair or bedside chair)?: A Little Help needed to walk in hospital room?: A Little Help needed climbing 3-5 steps with a railing? : A Lot 6 Click Score: 19    End of Session Equipment Utilized During Treatment: Gait belt Activity Tolerance: Patient limited by pain Patient left: in bed;with call bell/phone within reach;with bed alarm set   PT Visit Diagnosis: Unsteadiness on feet (R26.81);History of falling (Z91.81);Muscle weakness (generalized) (M62.81)     Time: 1610-9604 PT Time Calculation (min) (ACUTE ONLY): 26 min  Charges:    $Therapeutic Exercise: 8-22 mins $Therapeutic  Activity: 8-22 mins PT General Charges $$ ACUTE PT VISIT: 1 Visit                     Burtis Imhoff, PT, SPT 3:06 PM,05/28/23

## 2023-05-29 DIAGNOSIS — F10930 Alcohol use, unspecified with withdrawal, uncomplicated: Secondary | ICD-10-CM | POA: Diagnosis not present

## 2023-05-29 DIAGNOSIS — E611 Iron deficiency: Secondary | ICD-10-CM

## 2023-05-29 DIAGNOSIS — K7030 Alcoholic cirrhosis of liver without ascites: Secondary | ICD-10-CM | POA: Diagnosis not present

## 2023-05-29 DIAGNOSIS — F411 Generalized anxiety disorder: Secondary | ICD-10-CM | POA: Diagnosis not present

## 2023-05-29 LAB — BLOOD GAS, VENOUS
Acid-Base Excess: 4.8 mmol/L — ABNORMAL HIGH (ref 0.0–2.0)
Bicarbonate: 30.4 mmol/L — ABNORMAL HIGH (ref 20.0–28.0)
O2 Saturation: 78.6 %
Patient temperature: 37
pCO2, Ven: 48 mmHg (ref 44–60)
pH, Ven: 7.41 (ref 7.25–7.43)
pO2, Ven: 46 mmHg — ABNORMAL HIGH (ref 32–45)

## 2023-05-29 LAB — AMMONIA: Ammonia: 28 umol/L (ref 9–35)

## 2023-05-29 MED ORDER — FERROUS SULFATE 325 (65 FE) MG PO TABS
325.0000 mg | ORAL_TABLET | Freq: Every day | ORAL | Status: DC
  Administered 2023-05-29 – 2023-06-01 (×4): 325 mg via ORAL
  Filled 2023-05-29 (×4): qty 1

## 2023-05-29 NOTE — Plan of Care (Signed)
  Problem: Education: Goal: Knowledge of General Education information will improve Description: Including pain rating scale, medication(s)/side effects and non-pharmacologic comfort measures 05/29/2023 0749 by Anselm Jungling, RN Outcome: Progressing 05/29/2023 0742 by Anselm Jungling, RN Outcome: Progressing   Problem: Health Behavior/Discharge Planning: Goal: Ability to manage health-related needs will improve 05/29/2023 0749 by Anselm Jungling, RN Outcome: Progressing 05/29/2023 0742 by Anselm Jungling, RN Outcome: Progressing   Problem: Clinical Measurements: Goal: Ability to maintain clinical measurements within normal limits will improve 05/29/2023 0749 by Anselm Jungling, RN Outcome: Progressing 05/29/2023 0742 by Anselm Jungling, RN Outcome: Progressing Goal: Will remain free from infection 05/29/2023 0749 by Anselm Jungling, RN Outcome: Progressing 05/29/2023 0742 by Anselm Jungling, RN Outcome: Progressing Goal: Diagnostic test results will improve 05/29/2023 0749 by Anselm Jungling, RN Outcome: Progressing 05/29/2023 0742 by Anselm Jungling, RN Outcome: Progressing Goal: Respiratory complications will improve 05/29/2023 0749 by Anselm Jungling, RN Outcome: Progressing 05/29/2023 0742 by Anselm Jungling, RN Outcome: Progressing Goal: Cardiovascular complication will be avoided 05/29/2023 0749 by Anselm Jungling, RN Outcome: Progressing 05/29/2023 0742 by Anselm Jungling, RN Outcome: Progressing   Problem: Activity: Goal: Risk for activity intolerance will decrease 05/29/2023 0749 by Anselm Jungling, RN Outcome: Progressing 05/29/2023 0742 by Anselm Jungling, RN Outcome: Progressing   Problem: Nutrition: Goal: Adequate nutrition will be maintained 05/29/2023 0749 by Anselm Jungling, RN Outcome: Progressing 05/29/2023 0742 by Anselm Jungling, RN Outcome: Progressing   Problem: Coping: Goal: Level of anxiety will decrease 05/29/2023  0749 by Anselm Jungling, RN Outcome: Progressing 05/29/2023 0742 by Anselm Jungling, RN Outcome: Progressing   Problem: Elimination: Goal: Will not experience complications related to bowel motility 05/29/2023 0749 by Anselm Jungling, RN Outcome: Progressing 05/29/2023 0742 by Anselm Jungling, RN Outcome: Progressing Goal: Will not experience complications related to urinary retention 05/29/2023 0749 by Anselm Jungling, RN Outcome: Progressing 05/29/2023 0742 by Anselm Jungling, RN Outcome: Progressing   Problem: Pain Managment: Goal: General experience of comfort will improve 05/29/2023 0749 by Anselm Jungling, RN Outcome: Progressing 05/29/2023 0742 by Anselm Jungling, RN Outcome: Progressing   Problem: Safety: Goal: Ability to remain free from injury will improve 05/29/2023 0749 by Anselm Jungling, RN Outcome: Progressing 05/29/2023 0742 by Anselm Jungling, RN Outcome: Progressing   Problem: Skin Integrity: Goal: Risk for impaired skin integrity will decrease 05/29/2023 0749 by Anselm Jungling, RN Outcome: Progressing 05/29/2023 0742 by Anselm Jungling, RN Outcome: Progressing

## 2023-05-29 NOTE — Progress Notes (Signed)
Patient is now completely alert and oriented.  Previous symptoms of shakiness and seeming disorientation have resolved.  Continue to monitor.

## 2023-05-29 NOTE — Progress Notes (Signed)
Mobility tech entered room to ambulate patient.  She found him with his head resting on his arms on the table.  Per mobility tech, he was difficult to arouse but when he did he seemed rather dazed and said he felt like he had passed out.  When this RN entered room patient still seemed somewhat out of it.  Got patient from the chair back to bed and he was very shaky, barely able to take a few steps.  VSS.  Dr. Denton Lank notified.

## 2023-05-29 NOTE — Plan of Care (Signed)
Patient lying in bed this morning, no complaints at this time.

## 2023-05-29 NOTE — Progress Notes (Signed)
Mobility Specialist - Progress Note   05/29/23 1400  Mobility  Activity Transferred from chair to bed  Level of Assistance Minimal assist, patient does 75% or more  Assistive Device Other (Comment) (B HHA)  Distance Ambulated (ft) 4 ft  Activity Response Tolerated well  $Mobility charge 1 Mobility     Pt sitting in recliner upon arrival, utilizing RA. Pt's head resting on table in front of him; difficult to arouse but when aroused pt states "I think I had passed out". Pt appears dazed and asking "what just happened?" RN notified immediately and entered to assess pt--VS stable. Further activity deferred. Pt assisted back to bed with assist from author and nurse. Trembling during transfer. VC for sequencing. Pt returned to bed with alarm set, needs in reach.    Filiberto Pinks Mobility Specialist 05/29/23, 2:16 PM

## 2023-05-29 NOTE — Plan of Care (Signed)
  Problem: Activity: Goal: Risk for activity intolerance will decrease Outcome: Progressing   Problem: Nutrition: Goal: Adequate nutrition will be maintained Outcome: Progressing   

## 2023-05-29 NOTE — Plan of Care (Signed)
Patient ambulating to bathroom with one assist. Bowel movement last night. No prn ativan required during the night.

## 2023-05-29 NOTE — Progress Notes (Addendum)
Progress Note   Patient: Miguel [name]:[id number] DOB: 1961-04-03 DOA: 05/24/2023     5 DOS: the patient was seen and examined on 05/29/2023     Brief hospital course:  From HPI "[name] is a 62 year old male with history of alcohol abuse, anxiety, hypertension, depression, history of alcohol withdrawal, who presents to the emergency department for chief concerns of epigastric Vitals in the ED showed temperature 99.2, respiration rate of 19, heart rate of 100, blood pressure 170/84, SpO2 of 97% on room air. Serum sodium is 137, potassium is 3.9, chloride 100, bicarb 20, BUN of 12, serum creatinine 1.34, EGFR 60, nonfasting blood glucose 199, WBC 7.8, hemoglobin 12.8, platelets of 316."  Further hospital course and management as outlined below.   Assessment and Plan  Alcohol withdrawal with inpatient treatment  Recent CIWA scores -- 9 >> 4 >> 4 7/11, 7/12 - Has ongoing significant tremors 7/13 - still tremors but improving Started scheduled Librium 10 mg TID (7/10) Continue Librium to 25 mg TID (increased on 7/11) CIWA monitoring + PRN Ativan Fall, delirium & seizure precautions  Acute Gout Flare - left knee Started colchicine - 1.2 mg on 7/10 Continue colchicine 0.6 mg BID  PRN Norco Given IV Toradol, now stopped Continue allopurinol Pt reports side effects from steroids, will avoid prednisone   Normocytic anemia / Iron deficiency anemia HBG on admission was 12.8 >> 10.8 >> 10.6 >> 9.3 >> 10.3 No signs of bleeding.  Normal iron studies, B12 and folate level in 01-31-2023. Suspect myelosuppression related to alcohol abuse. Anemia panel shows iron deficiency, normal folate & b12 Started oral iron supplement Monitor CBC   Total bilirubin, elevated With right upper quadrant abdominal pain Abdominal ultrasound did not show any acute abdominal pathology Continue to monitor LFTs   Alcohol abuse with dependence Counseling given Pt interested in Naltrexone - will start  at/before discharge once withdrawal symptoms improve further Mgmt of withdrawal as above Continue thiamine multivitamin, folic acid    AKI (acute kidney injury) (HCC)-improved Presented with creatinine of 1.34 which normalized Suspect secondary to poor p.o. intake Given fluids on admission & improved Monitor renal function closely Avoid nephrotoxic medications    Traumatic ecchymosis Mid lower back, and right mid inner thigh Present on admission, presumed secondary to falling in setting of resuming excessive alcohol intake They appear to be fading/healing PT OT consulted - SNF recommended but pt wants to go home   Depression Generalized anxiety disorder Continue home sertraline 50 mg daily    Sinus tachycardia  - resolved.  Likely due to alcohol withdrawal and pain from gout flare.h home metoprolol Continue home metoprolol succinate 25 mg daily Metoprolol IV PRN   Hypertension Continue metoprolol succinate 25 mg daily       DVT prophylaxis: Enoxaparin  Code Status: Full code  Diet: Heart healthy diet   Admission status:  Status is: Inpatient Remains inpatient appropriate because: ongoing alcohol withdrawal, expect dc in 1-2 days if further improved   Anticipated Dispo: SNF recommended, but pt declines,  Expect home with HH in 1-2 days    Subjective / Interval history:  Pt awake resting in bed this morning.  He states tremors are somewhat better, improving. Right knee still sore but slowly improving.  Discussed out of bed activity as much as possible, given he does not want SNF, wants to return home.  Says his legs did feel wobbly when he was up yesterday to and from the chair.  Discussed medication for alcohol dependency, specifically naltrexone.  He is very interested in starting medication, stating he wants to stop drinking or cut back significantly.  Discussed he'll need to get his PCP our another outpatient prescriber to continue it.    Physical  Exam: General exam: awake, alert, no acute distress, chronically ill appearing HEENT: moist mucus membranes, hearing grossly normal  Respiratory system: CTAB, no wheezes, rales or rhonchi, normal respiratory effort. Cardiovascular system: normal S1/S2, RRR, no peripheral edema Central nervous system: A&O x. no gross focal neurologic deficits, normal speech Extremities: improving tremor of outstretched hands, right knee tenderness improved Skin: dry, intact, normal temperature Psychiatry: normal mood, congruent affect, judgement and insight appear normal     Data Reviewed: No new labs today  Anemia panel --  Iron low 17, TIBC normal 252, sat ratio low 7%, normal ferritin 73 Normal folate and b12    Family Communication: No family present at bedside    Time spent: 42 minutes      Vitals:   05/28/23 1602 05/28/23 2036 05/29/23 0351 05/29/23 0803  BP: (!) 159/89 (!) 166/91 (!) 144/86 (!) 154/92  Pulse: 74 71 64 68  Resp: 19 18 18 18   Temp: 98.3 F (36.8 C) 98.1 F (36.7 C) (!) 97.5 F (36.4 C) 97.7 F (36.5 C)  TempSrc: Oral  Oral Oral  SpO2: 99% 97% 97% 100%  Weight:      Height:           Author: Pennie Banter, DO 05/29/2023 1:03 PM  For on call review [email or web address].

## 2023-05-30 DIAGNOSIS — I951 Orthostatic hypotension: Secondary | ICD-10-CM

## 2023-05-30 DIAGNOSIS — K7030 Alcoholic cirrhosis of liver without ascites: Secondary | ICD-10-CM | POA: Diagnosis not present

## 2023-05-30 DIAGNOSIS — F10930 Alcohol use, unspecified with withdrawal, uncomplicated: Secondary | ICD-10-CM | POA: Diagnosis not present

## 2023-05-30 LAB — BASIC METABOLIC PANEL
Anion gap: 4 — ABNORMAL LOW (ref 5–15)
BUN: 15 mg/dL (ref 8–23)
CO2: 28 mmol/L (ref 22–32)
Calcium: 8.6 mg/dL — ABNORMAL LOW (ref 8.9–10.3)
Chloride: 107 mmol/L (ref 98–111)
Creatinine, Ser: 1.04 mg/dL (ref 0.61–1.24)
GFR, Estimated: >60 mL/min (ref >60)
Glucose, Bld: 117 mg/dL — ABNORMAL HIGH (ref 70–99)
Potassium: 4.1 mmol/L (ref 3.5–5.1)
Sodium: 139 mmol/L (ref 135–145)

## 2023-05-30 LAB — MAGNESIUM: Magnesium: 1.8 mg/dL (ref 1.7–2.4)

## 2023-05-30 MED ORDER — NALTREXONE HCL 50 MG PO TABS
50.0000 mg | ORAL_TABLET | Freq: Every day | ORAL | Status: DC
  Administered 2023-05-30: 50 mg via ORAL
  Filled 2023-05-30 (×2): qty 1

## 2023-05-30 MED ORDER — SODIUM CHLORIDE 0.9 % IV SOLN: INTRAVENOUS | Status: AC

## 2023-05-30 MED ORDER — CHLORDIAZEPOXIDE HCL 10 MG PO CAPS
20.0000 mg | ORAL_CAPSULE | Freq: Three times a day (TID) | ORAL | Status: DC
  Administered 2023-05-30 – 2023-05-31 (×6): 20 mg via ORAL
  Filled 2023-05-30 (×6): qty 2

## 2023-05-30 NOTE — Progress Notes (Addendum)
Progress Note   Patient: Miguel [name] ZOX:[id number] DOB: 06-09-1961 DOA: 05/24/2023     6 DOS: the patient was seen and examined on 05/30/2023     Brief hospital course:  From HPI "Mr. Reagen Haberman is a 62 year old male with history of alcohol abuse, anxiety, hypertension, depression, history of alcohol withdrawal, who presents to the emergency department for chief concerns of epigastric Vitals in the ED showed temperature 99.2, respiration rate of 19, heart rate of 100, blood pressure 170/84, SpO2 of 97% on room air. Serum sodium is 137, potassium is 3.9, chloride 100, bicarb 20, BUN of 12, serum creatinine 1.34, EGFR 60, nonfasting blood glucose 199, WBC 7.8, hemoglobin 12.8, platelets of 316."  Further hospital course and management as outlined below.   Assessment and Plan  Alcohol withdrawal with inpatient treatment  Recent CIWA scores -- 9 >> 4 >> 4 7/11, 7/12 - Has ongoing significant tremors 7/13 - still tremors but improving Started scheduled Librium 10 mg TID (7/10), increased to 25 mg TID on 7/11 Decrease Librium to 25 >> 20 mg TID  CIWA monitoring + PRN Ativan Fall, delirium & seizure precautions  Orthostatic hypotension  Positive orthostatic vitals today 7/14, after ?syncopal episode yesterday & pt reporting dizziness beforehand. Will hydrate with NS @ 100/hr and repeat orthostatics. Resting BP's are controlled to mildly elevated. Fall precautions  Acute Gout Flare - left knee Started colchicine - 1.2 mg on 7/10 Continue colchicine 0.6 mg BID  PRN Norco Given IV Toradol, now stopped Continue allopurinol Pt reports side effects from steroids, will avoid prednisone   Normocytic anemia / Iron deficiency anemia Hbg stable. No signs of bleeding.  Normal iron studies, B12 and folate level in 2023-01-27. Suspect myelosuppression related to alcohol abuse. Anemia panel this admission shows iron deficiency, normal folate & b12 Started oral iron supplement Monitor CBC Will  need outpatient GI referral for screening colonoscopy   Total bilirubin, elevated With right upper quadrant abdominal pain Abdominal ultrasound did not show any acute abdominal pathology Continue to monitor LFTs   Alcohol abuse with dependence Counseling given Pt interested in Naltrexone - will start today & send Rx at discharge  He denies any chronic opioid use or hx of opioid withdrawal.   Will need outpatient follow up to have naltrexone continued Mgmt of withdrawal as above Continue thiamine multivitamin, folic acid    AKI (acute kidney injury) (HCC)-improved Presented with creatinine of 1.34 which normalized Suspect secondary to poor p.o. intake Given fluids on admission & improved Monitor renal function closely Avoid nephrotoxic medications    Traumatic ecchymosis Mid lower back, and right mid inner thigh Present on admission, presumed secondary to falling in setting of resuming excessive alcohol intake They appear to be fading/healing PT OT consulted - SNF recommended but pt wants to go home   Depression Generalized anxiety disorder Continue home sertraline 50 mg daily    Sinus tachycardia  - resolved.  Likely due to alcohol withdrawal and pain from gout flare.h home metoprolol Continue home metoprolol succinate 25 mg daily Metoprolol IV PRN   Hypertension Continue metoprolol succinate 25 mg daily       DVT prophylaxis: Enoxaparin  Code Status: Full code  Diet: Heart healthy diet   Admission status:  Status is: Inpatient Remains inpatient appropriate because: closely monitoring & evaluating for orthostasis given suspected syncopal episode yesterday   Anticipated Dispo: SNF recommended, but pt declines.  No HH able to be obtained per TOC. Likely d/c home tomorrow  Subjective / Interval history:  Pt awake resting in bed this morning. Notified yesterday afternoon by nursing that pt had possibly passed out. They found him with head down on the table,  seated in recliner.  He was slow to respond when they woke him up and had poor recall of what happened. Upon waking his mentation was at baseline.  Today pt notes he felt dizzy beforehand, thinks he passed out.  He had gotten up to the chair to have lunch and felt dizzy when he got up. Denies feeling dizzy today but has not been up out of bed yet.  Reports ongoing right knee pain, now right elbow beginning to hurt as well.      Physical Exam: General exam: awake, alert, no acute distress, chronically ill appearing HEENT: moist mucus membranes, hearing grossly normal  Respiratory system: CTAB, no wheezes, rales or rhonchi, normal respiratory effort. Cardiovascular system: normal S1/S2, RRR, no peripheral edema Central nervous system: A&O x. no gross focal neurologic deficits, normal speech Extremities: minimal tremor of right hand, left hand no tremor today, no edema Skin: dry, intact, normal temperature Psychiatry: normal mood, congruent affect, judgement and insight appear normal     Data Reviewed: Notable labs -- glucose 117, Ca 8.6, gap 4 otherwise normal BMP  Anemia panel --  Iron low 17, TIBC normal 252, sat ratio low 7%, normal ferritin 73 Normal folate and b12    Family Communication: Wife updated by phone today 7/15   Time spent: 38 minutes      Vitals:   05/30/23 0729 05/30/23 1029 05/30/23 1032 05/30/23 1036  BP: (!) 137/92 (!) 141/79 121/80 116/73  Pulse: 67 81 77 (!) 109  Resp: 18     Temp: 98 F (36.7 C)     TempSrc: Oral     SpO2: 99%     Weight:      Height:           Author: Pennie Banter, DO 05/30/2023 12:41 PM  For on call review [email or web address].

## 2023-05-30 NOTE — Plan of Care (Signed)

## 2023-05-30 NOTE — TOC Progression Note (Signed)
Transition of Care [facility] [place]) - Progression Note    Patient Details  Name: Miguel [name] MRN: [id number] Date of Birth: [birth year]  [facility] CM/SW Contact  Kemper Durie, RN Phone Number: 05/30/2023, 10:36 AM  Clinical Narrative:     Discussed option of outpatient PT services as he declined SNF and TOC was unable to find [facility] team to accept referral.  He declines to have outpatient therapy, states his wife has MS and it is difficult to find someone to care for her while he is out for appointments.  Encouraged to consider having someone (family or paid caregiver) to sit with wife so he can attend therapy.  State he will consider, will discuss with PCP if he changes his mind.        Expected Discharge Plan and Services                                               Social Determinants of Health (SDOH) Interventions SDOH Screenings   Food Insecurity: No Food Insecurity (05/24/2023)  Housing: Low Risk  (05/24/2023)  Transportation Needs: No Transportation Needs (05/24/2023)  Utilities: Not At Risk (05/24/2023)  Alcohol Screen: Medium Risk (03/19/2021)  Depression (PHQ2-9): Low Risk  (04/16/2023)  Financial Resource Strain: High Risk (09/18/2020)  Physical Activity: Sufficiently Active (09/18/2020)  Social Connections: Socially Isolated (09/18/2020)  Stress: Stress Concern Present (09/18/2020)  Tobacco Use: Medium Risk (05/24/2023)    Readmission Risk Interventions    05/25/2023    4:31 PM 01/19/2023   12:40 PM  Readmission Risk Prevention Plan  Transportation Screening Complete Complete  Medication Review (RN Care Manager) Referral to Pharmacy Complete  PCP or Specialist appointment within 3-5 days of discharge Complete Complete  HRI or Home Care Consult Complete   SW Recovery Care/Counseling Consult Complete Complete  Palliative Care Screening Not Applicable Not Applicable  Skilled Nursing Facility Not Applicable Not Applicable

## 2023-05-30 NOTE — Progress Notes (Signed)
Mobility Specialist - Progress Note:     05/30/23 1230  Mobility  Activity Ambulated with assistance in room  Level of Assistance Minimal assist, patient does 75% or more  Assistive Device Front wheel walker  Distance Ambulated (ft) 20 ft  Range of Motion/Exercises Active  Activity Response Tolerated well  Mobility Referral Yes  $Mobility charge 1 Mobility  Mobility Specialist Start Time (ACUTE ONLY) 1204  Mobility Specialist Stop Time (ACUTE ONLY) 1230  Mobility Specialist Time Calculation (min) (ACUTE ONLY) 26 min   Pt resting in bed on RA upon entry. Pt STS and ambulates to chair right outside room door MinA with RW. Pt endorses "seeing stars at EOB but, said it went away after standing." Pt took seated rest break in chair and returned to recliner and left with needs in reach. Pt chair alarm activated.   Johnathan Hausen Mobility Specialist 05/30/23, 1:08 PM

## 2023-05-31 DIAGNOSIS — M109 Gout, unspecified: Secondary | ICD-10-CM

## 2023-05-31 DIAGNOSIS — K7030 Alcoholic cirrhosis of liver without ascites: Secondary | ICD-10-CM | POA: Diagnosis not present

## 2023-05-31 DIAGNOSIS — F411 Generalized anxiety disorder: Secondary | ICD-10-CM | POA: Diagnosis not present

## 2023-05-31 DIAGNOSIS — F10930 Alcohol use, unspecified with withdrawal, uncomplicated: Secondary | ICD-10-CM | POA: Diagnosis not present

## 2023-05-31 NOTE — Progress Notes (Signed)
Occupational Therapy Treatment Patient Details Name: Miguel [name] MRN: [id number] DOB: January 31, 1961 Today's Date: 05/31/2023   History of present illness Pt is a 62 y/o M who presents with chest and right upper quadrant pain, attributed to alcohol abuse and AKI, counseling provided and medication. PMH significant for alcohol abuse, HTN, depression, anxiety, GERD, gout, OCD   OT comments  Pt seen for OT treatment on this date. Upon arrival to room pt supine with MD present, agreeable to OT Tx session. OT facilitated ADL management as described below. See ADL section for additional details regarding occupational performance. Pt continues to be functionally limited by pain, decreased activity tolerance, generalized weakness, bilat tremors and intermittent dizziness with mobility. Benefits from encouragement to participate in self-care tasks, mild dec safety awareness and increased time for bed mobility. C/o dizziness once supine > sitting EOB. BP sitting EOB 133/81, pain 9/10 (RN aware).  Pt return verbalizes understanding of education provided t/o session. Pt is progressing toward OT goals and continues to benefit from skilled OT services to maximize return to PLOF and minimize risk of future falls, injury, caregiver burden, and readmission. Will continue to follow POC as written. Discharge recommendation remains appropriate.     Recommendations for follow up therapy are one component of a multi-disciplinary discharge planning process, led by the attending physician.  Recommendations may be updated based on patient status, additional functional criteria and insurance authorization.    Assistance Recommended at Discharge Frequent or constant Supervision/Assistance  Patient can return home with the following  A little help with walking and/or transfers;A little help with bathing/dressing/bathroom;Assistance with cooking/housework;Direct supervision/assist for financial management;Direct supervision/assist  for medications management;Assist for transportation;Help with stairs or ramp for entrance   Equipment Recommendations  BSC/3in1       Precautions / Restrictions Precautions Precautions: Fall       Mobility Bed Mobility Overal bed mobility: Needs Assistance Bed Mobility: Sit to Supine     Supine to sit: Supervision Sit to supine: Supervision   General bed mobility comments: Increased time    Transfers Overall transfer level: Needs assistance Equipment used: 1 person hand held assist               General transfer comment: HHA to stand from EOB and laterally sidestep for linen mgmt     Balance Overall balance assessment: Needs assistance Sitting-balance support: No upper extremity supported, Feet supported Sitting balance-Leahy Scale: Good     Standing balance support: During functional activity                               ADL either performed or assessed with clinical judgement   ADL Overall ADL's : Needs assistance/impaired     Grooming: Sitting;Set up;Wash/dry face;Oral care Grooming Details (indicate cue type and reason): OOB defered at this time d/t pt's level of pain and c/o dizziness                                      Cognition Arousal/Alertness: Awake/alert Behavior During Therapy: WFL for tasks assessed/performed Overall Cognitive Status: Within Functional Limits for tasks assessed                                 General Comments: Benefits from encourgement; pleasant  General Comments BUE tremors    Pertinent Vitals/ Pain       Pain Assessment Pain Assessment: 0-10 Pain Score: 9  Pain Location: R elbow Pain Intervention(s): RN gave pain meds during session, Limited activity within patient's tolerance   Frequency  Min 1X/week        Progress Toward Goals  OT Goals(current goals can now be found in the care plan section)  Progress towards OT goals: Progressing toward  goals  Acute Rehab OT Goals OT Goal Formulation: With patient Time For Goal Achievement: 06/09/23  Plan Discharge plan remains appropriate       AM-PAC OT "6 Clicks" Daily Activity     Outcome Measure   Help from another person eating meals?: None Help from another person taking care of personal grooming?: A Little Help from another person toileting, which includes using toliet, bedpan, or urinal?: A Little Help from another person bathing (including washing, rinsing, drying)?: A Little Help from another person to put on and taking off regular upper body clothing?: None Help from another person to put on and taking off regular lower body clothing?: A Little 6 Click Score: 20    End of Session    OT Visit Diagnosis: Unsteadiness on feet (R26.81);Repeated falls (R29.6);Muscle weakness (generalized) (M62.81)   Activity Tolerance Patient limited by pain   Patient Left in bed;with call bell/phone within reach;with bed alarm set   Nurse Communication Mobility status;Patient requests pain meds;Other (comment)        Time: 1610-9604 OT Time Calculation (min): 24 min  Charges: OT General Charges $OT Visit: 1 Visit OT Treatments $Self Care/Home Management : 23-37 mins  Kaleb Sek L. Cari Burgo, OTR/L  05/31/23, 2:01 PM

## 2023-05-31 NOTE — Progress Notes (Signed)
Progress Note   Patient: Miguel [name] ZHY:[id number] DOB: Mar 28, 1961 DOA: 05/24/2023     7 DOS: the patient was seen and examined on 05/31/2023     Brief hospital course:  From HPI "[name] is a 62 year old male with history of alcohol abuse, anxiety, hypertension, depression, history of alcohol withdrawal, who presents to the emergency department for chief concerns of epigastric Vitals in the ED showed temperature 99.2, respiration rate of 19, heart rate of 100, blood pressure 170/84, SpO2 of 97% on room air. Serum sodium is 137, potassium is 3.9, chloride 100, bicarb 20, BUN of 12, serum creatinine 1.34, EGFR 60, nonfasting blood glucose 199, WBC 7.8, hemoglobin 12.8, platelets of 316."  Further hospital course and management as outlined below.   Assessment and Plan  Alcohol withdrawal with inpatient treatment  Recent CIWA scores -- 14 >> 0 >> 18 >> 4 >> 4 7/11, 7/12 - Has ongoing significant tremors 7/13 - still tremors but improving Started scheduled Librium 10 mg TID (7/10), increased to 25 mg TID on 7/11 Decreased Librium to 25 >> 20 mg TID (7/14) Continue current dose Librium given escalated CIWA scores Continue CIWA monitoring + PRN Ativan Fall, delirium & seizure precautions  Alcohol abuse with dependence Counseling given about importance of cessation or at least moderation. Pt interested in Naltrexone.  He denies any chronic opioid use or hx of opioid withdrawal.   First dose Naltrexone given on 7/14 as withdrawal symptoms were resolved.  Pt later that day & overnight developed escalating CIWA scores up to 18, with sweats, anxiety, agitation, increased tremors. STOP Naltrexone & have patient follow up with PCP to start in outpatient setting if appropriate. Mgmt of withdrawal as above Continue thiamine multivitamin, folic acid   Orthostatic hypotension  Positive orthostatic vitals today 7/14, after ?syncopal episode yesterday & pt reporting dizziness beforehand. Pt  was hydrated with NS @ 100/hr  Follow up repeat orthostatics. Resting BP's are controlled to mildly elevated. Fall precautions  Acute Gout Flare - left knee Started colchicine - 1.2 mg on 7/10 Continue colchicine 0.6 mg BID  PRN Norco Given IV Toradol, now stopped Continue allopurinol Pt reports side effects from steroids, will avoid prednisone   Normocytic anemia / Iron deficiency anemia Hbg stable. No signs of bleeding.  Normal iron studies, B12 and folate level in 01-29-23. Suspect myelosuppression related to alcohol abuse. Anemia panel this admission shows iron deficiency, normal folate & b12 Started oral iron supplement Monitor CBC Will need outpatient GI referral for screening colonoscopy   Total bilirubin, elevated With right upper quadrant abdominal pain Abdominal ultrasound did not show any acute abdominal pathology Continue to monitor LFTs   AKI (acute kidney injury) (HCC)-improved Presented with creatinine of 1.34 which normalized Suspect secondary to poor p.o. intake Given fluids on admission & improved Monitor renal function closely Avoid nephrotoxic medications    Traumatic ecchymosis Mid lower back, and right mid inner thigh Present on admission, presumed secondary to falling in setting of resuming excessive alcohol intake They appear to be fading/healing PT OT consulted - SNF recommended but pt wants to go home   Depression Generalized anxiety disorder Continue home sertraline 50 mg daily    Sinus tachycardia  - resolved.  Likely due to alcohol withdrawal and pain from gout flare.h home metoprolol Continue home metoprolol succinate 25 mg daily Metoprolol IV PRN   Hypertension Continue metoprolol succinate 25 mg daily       DVT prophylaxis: Enoxaparin  Code Status: Full  code  Diet: Heart healthy diet   Admission status:  Status is: Inpatient Remains inpatient appropriate because: closely monitoring & evaluating for orthostasis given  suspected syncopal episode yesterday   Anticipated Dispo: SNF recommended, but pt declines.  No HH able to be obtained per TOC. Likely d/c home tomorrow    Subjective / Interval history:  Pt awake resting in bed this morning. Notified yesterday afternoon by nursing that pt had possibly passed out. They found him with head down on the table, seated in recliner.  He was slow to respond when they woke him up and had poor recall of what happened. Upon waking his mentation was at baseline.  Today pt notes he felt dizzy beforehand, thinks he passed out.  He had gotten up to the chair to have lunch and felt dizzy when he got up. Denies feeling dizzy today but has not been up out of bed yet.  Reports ongoing right knee pain, now right elbow beginning to hurt as well.      Physical Exam: General exam: awake, alert, no acute distress, chronically ill appearing HEENT: moist mucus membranes, hearing grossly normal  Respiratory system: CTAB, no wheezes, rales or rhonchi, normal respiratory effort. Cardiovascular system: normal S1/S2, RRR, no peripheral edema Central nervous system: A&O x. no gross focal neurologic deficits, normal speech Extremities: minimal tremor of right hand, left hand no tremor today, no edema Skin: dry, intact, normal temperature Psychiatry: normal mood, congruent affect, judgement and insight appear normal     Data Reviewed: Notable labs -- glucose 117, Ca 8.6, gap 4 otherwise normal BMP  Anemia panel --  Iron low 17, TIBC normal 252, sat ratio low 7%, normal ferritin 73 Normal folate and b12    Family Communication: No family present    Time spent: 45 minutes      Vitals:   05/31/23 0342 05/31/23 0811 05/31/23 0917 05/31/23 1154  BP: (!) 158/83 (!) 157/70 (!) 170/91 133/81  Pulse: 68 68 68 82  Resp:  18    Temp:  98 F (36.7 C)    TempSrc:  Oral    SpO2:  100%    Weight:      Height:           Author: Pennie Banter, DO 05/31/2023 1:40  PM  For on call review [email or web address].

## 2023-05-31 NOTE — Plan of Care (Signed)

## 2023-05-31 NOTE — Progress Notes (Signed)
Mobility Specialist - Progress Note   05/31/23 1500  Mobility  Activity Refused mobility     Pt politely declined mobility; reports he just finished ambulating hallway prior to entry and wants to rest at this time. Will attempt another date/time.    Miguel [name] Mobility Specialist 05/31/23, 3:22 PM

## 2023-05-31 NOTE — Progress Notes (Addendum)
Physical Therapy Treatment Patient Details Name: Miguel [name] MRN: [id number] DOB: 01/21/1961 Today's Date: 05/31/2023   History of Present Illness Pt is a 62 y/o M who presents with chest and right upper quadrant pain, attributed to alcohol abuse and AKI, counseling provided and medication. PMH significant for alcohol abuse, HTN, depression, anxiety, GERD, gout, OCD    PT Comments  Pt presents laying in bed, pain currently 3/10 in R elbow, R knee pain has improved since last visit. Pt was notably more mobile today versus previous sessions. Orthostatics were performed due to continued complaints of dizziness and a noted potential syncopal episode over the weekend, but was unremarkable. Pt was able to tolerate further distances today due to decreased knee pain, CGA with RW for mobility. Pt would benefit from continued skilled therapy to address current functional and endurance deficits and return to PLOF.       Assistance Recommended at Discharge Frequent or constant Supervision/Assistance  If plan is discharge home, recommend the following:  Can travel by private vehicle    Assistance with cooking/housework;Assist for transportation;Help with stairs or ramp for entrance;Direct supervision/assist for medications management;Direct supervision/assist for financial management;A lot of help with bathing/dressing/bathroom;A little help with walking and/or transfers   Yes  Equipment Recommendations  Rolling walker (2 wheels)    Recommendations for Other Services       Precautions / Restrictions Precautions Precautions: Fall Restrictions Weight Bearing Restrictions: No     Mobility  Bed Mobility Overal bed mobility: Modified Independent Bed Mobility: Supine to Sit     Supine to sit: Supervision     General bed mobility comments: Increased time    Transfers Overall transfer level: Needs assistance Equipment used: Rolling walker (2 wheels) Transfers: Sit to/from Stand Sit to  Stand: Min guard           General transfer comment: notable improvement in mobilitiy during session likely due to decreased knee pain    Ambulation/Gait Ambulation/Gait assistance: Min guard Gait Distance (Feet): 120 Feet Assistive device: Rolling walker (2 wheels) Gait Pattern/deviations: Shuffle Gait velocity: decreased     General Gait Details: Improved weight shifting and stance time on R LE since R knee pain has been more controlled.   Stairs             Wheelchair Mobility     Tilt Bed    Modified Rankin (Stroke Patients Only)       Balance Overall balance assessment: Needs assistance Sitting-balance support: Feet supported, No upper extremity supported Sitting balance-Leahy Scale: Good     Standing balance support: During functional activity, Reliant on assistive device for balance Standing balance-Leahy Scale: Fair Standing balance comment: decreased reliance on walker during session                            Cognition Arousal/Alertness: Awake/alert Behavior During Therapy: WFL for tasks assessed/performed Overall Cognitive Status: Within Functional Limits for tasks assessed                                          Exercises      General Comments General comments (skin integrity, edema, etc.): B/l UE tremors still present, orthostatics performed due to continued complaints of dizziness and syncopal episode over the weekend      Pertinent Vitals/Pain Pain Assessment Pain Assessment: 0-10 Pain Score: 3  Pain Location: R elbow Pain Descriptors / Indicators: Discomfort, Constant, Aching Pain Intervention(s): Monitored during session    Home Living                          Prior Function            PT Goals (current goals can now be found in the care plan section) Acute Rehab PT Goals Patient Stated Goal: to return to home PT Goal Formulation: With patient Time For Goal Achievement:  06/09/23 Potential to Achieve Goals: Good Progress towards PT goals: Progressing toward goals    Frequency    Min 1X/week      PT Plan Current plan remains appropriate    Co-evaluation              AM-PAC PT "6 Clicks" Mobility   Outcome Measure  Help needed turning from your back to your side while in a flat bed without using bedrails?: None Help needed moving from lying on your back to sitting on the side of a flat bed without using bedrails?: None Help needed moving to and from a bed to a chair (including a wheelchair)?: A Little Help needed standing up from a chair using your arms (e.g., wheelchair or bedside chair)?: A Little Help needed to walk in hospital room?: A Little Help needed climbing 3-5 steps with a railing? : A Lot 6 Click Score: 19    End of Session Equipment Utilized During Treatment: Gait belt Activity Tolerance: Patient tolerated treatment well;No increased pain Patient left: in chair;with call bell/phone within reach;with chair alarm set   PT Visit Diagnosis: Unsteadiness on feet (R26.81);History of falling (Z91.81);Muscle weakness (generalized) (M62.81)     Time: 3244-0102 PT Time Calculation (min) (ACUTE ONLY): 26 min  Charges:    $Therapeutic Activity: 23-37 mins PT General Charges $$ ACUTE PT VISIT: 1 Visit                     Kelani Robart, PT, SPT 3:48 PM,05/31/23

## 2023-05-31 NOTE — Plan of Care (Signed)

## 2023-06-01 ENCOUNTER — Emergency Department
Admission: EM | Admit: 2023-06-01 | Discharge: 2023-06-01 | Discharge status: Against Medical Advice | Payer: MEDICAID | Attending: Emergency Medicine | Admitting: Emergency Medicine

## 2023-06-01 ENCOUNTER — Other Ambulatory Visit: Payer: Self-pay

## 2023-06-01 DIAGNOSIS — Z5321 Procedure and treatment not carried out due to patient leaving prior to being seen by health care provider: Secondary | ICD-10-CM | POA: Diagnosis not present

## 2023-06-01 DIAGNOSIS — Z043 Encounter for examination and observation following other accident: Secondary | ICD-10-CM | POA: Diagnosis present

## 2023-06-01 MED ORDER — VITAMIN B-1 100 MG PO TABS: 100.0000 mg | ORAL_TABLET | Freq: Every day | ORAL | 1 refills | Status: AC

## 2023-06-01 MED ORDER — FOLIC ACID 1 MG PO TABS: 1.0000 mg | ORAL_TABLET | Freq: Every day | ORAL | 1 refills | Status: AC

## 2023-06-01 MED ORDER — CHLORDIAZEPOXIDE HCL 5 MG PO CAPS: ORAL_CAPSULE | ORAL | 0 refills | Status: DC

## 2023-06-01 MED ORDER — COLCHICINE 0.6 MG PO TABS: 0.6000 mg | ORAL_TABLET | Freq: Two times a day (BID) | ORAL | 0 refills | Status: AC

## 2023-06-01 MED ORDER — CHLORDIAZEPOXIDE HCL 5 MG PO CAPS
15.0000 mg | ORAL_CAPSULE | Freq: Three times a day (TID) | ORAL | Status: DC
  Administered 2023-06-01: 15 mg via ORAL
  Filled 2023-06-01: qty 1

## 2023-06-01 MED ORDER — HYDROCODONE-ACETAMINOPHEN 5-325 MG PO TABS: 1.0000 | ORAL_TABLET | Freq: Four times a day (QID) | ORAL | 0 refills | Status: DC | PRN

## 2023-06-01 MED ORDER — FERROUS SULFATE 325 (65 FE) MG PO TABS: 325.0000 mg | ORAL_TABLET | Freq: Every day | ORAL | 1 refills | Status: AC

## 2023-06-01 MED ORDER — ADULT MULTIVITAMIN W/MINERALS CH: 1.0000 | ORAL_TABLET | Freq: Every day | ORAL | Status: DC

## 2023-06-01 MED ORDER — ADULT MULTIVITAMIN W/MINERALS CH: 1.0000 | ORAL_TABLET | Freq: Every day | ORAL | Status: AC

## 2023-06-01 MED ORDER — FOLIC ACID 1 MG PO TABS: 1.0000 mg | ORAL_TABLET | Freq: Every day | ORAL | 1 refills | Status: DC

## 2023-06-01 MED ORDER — COLCHICINE 0.6 MG PO TABS: 0.6000 mg | ORAL_TABLET | Freq: Two times a day (BID) | ORAL | 0 refills | Status: DC

## 2023-06-01 MED ORDER — FERROUS SULFATE 325 (65 FE) MG PO TABS: 325.0000 mg | ORAL_TABLET | Freq: Every day | ORAL | 1 refills | Status: DC

## 2023-06-01 MED ORDER — VITAMIN B-1 100 MG PO TABS: 100.0000 mg | ORAL_TABLET | Freq: Every day | ORAL | 1 refills | Status: DC

## 2023-06-01 MED ORDER — CHLORDIAZEPOXIDE HCL 5 MG PO CAPS: ORAL_CAPSULE | ORAL | 0 refills | Status: AC

## 2023-06-01 NOTE — ED Triage Notes (Signed)
Pt sts that he was at home and fell. Pt sts that he was just released from [facility] today.

## 2023-06-01 NOTE — Discharge Summary (Signed)
Physician Discharge Summary   Patient: Miguel [name] MRN: [id number] DOB: 1961/09/08  Admit date:     05/24/2023  Discharge date: 05/31/2023  Discharge Physician: Pennie Banter   PCP: Jacky Kindle, FNP   Recommendations at discharge:   Follow up with Primary Care in 1-2 weeks Repeat CBC, BMP, Mg in 1-2 weeks Follow up on patient's efforts at alcohol cessation.  Pt expressed interest in naltrexone.  Discharge Diagnoses: Principal Problem:   Alcohol withdrawal with inpatient treatment [facility]) Active Problems:   AKI (acute kidney injury) (HCC)   Alcohol abuse   Total bilirubin, elevated   Generalized anxiety disorder   Hypertension   Sinus tachycardia   Gout   Alcoholic cirrhosis of liver without ascites (HCC)   Orthostatic hypotension   Mixed hyperlipidemia   Essential hypertension   Depression   Traumatic ecchymosis   Iron deficiency  Resolved Problems:   * No resolved hospital problems. *  Hospital Course:  From HPI "[name] is a 62 year old male with history of alcohol abuse, anxiety, hypertension, depression, history of alcohol withdrawal, who presents to the emergency department for chief concerns of epigastric Vitals in the ED showed temperature 99.2, respiration rate of 19, heart rate of 100, blood pressure 170/84, SpO2 of 97% on room air. Serum sodium is 137, potassium is 3.9, chloride 100, bicarb 20, BUN of 12, serum creatinine 1.34, EGFR 60, nonfasting blood glucose 199, WBC 7.8, hemoglobin 12.8, platelets of 316."   Further hospital course and management as outlined below.   7/16 -- withdrawal symptoms have improved.  Pt still has mild tremor which I expect is chronic benign essential tremor and or related to anxiety.   He discussed in detail social difficulties with his wife's family not wanting him to return to their home.  He states he and wife are on good terms despite a recent disagreement after which he was staying at a motel prior to admission.  He  plans to return to motel for a few days until his wife's sister leaves their home.  Discusses challenges they've experiences related to care-giving for his wife who is bed-bound and disabled from MS.    Pt is medically stable for discharge.  He agrees to close PCP follow up to discuss naltrexone to help with alcohol cravings.   Assessment and Plan:  Alcohol withdrawal with inpatient treatment  Monitored by CIWA protocol and treated with PRN Ativvan Started scheduled Librium 10 mg TID (7/10), increased to 25 mg TID on 7/11.   Will discharge on longer taper of Librium given duration of his withdrawal course.    Alcohol abuse with dependence Counseling given about importance of cessation or at least moderation. Pt interested in Naltrexone.  He denies any chronic opioid use or hx of opioid withdrawal.   First dose Naltrexone given on 7/14 as withdrawal symptoms were resolved.  Pt later that day & overnight developed escalating CIWA scores up to 18, with sweats, anxiety, agitation, increased tremors. STOP Naltrexone & have patient follow up with PCP to start in outpatient setting if appropriate. Mgmt of withdrawal as above Continue thiamine multivitamin, folic acid    Orthostatic hypotension  Positive orthostatic vitals today 7/14, after ?syncopal episode yesterday & pt reporting dizziness beforehand. Pt was hydrated with NS @ 100/hr  Follow up repeat orthostatics - improved Resting BP's are controlled to mildly elevated. Fall precautions Walker provided for discharge   Acute Gout Flare - left knee Started colchicine - 1.2 mg on  7/10 Continue colchicine 0.6 mg BID  PRN Norco Given IV Toradol, now stopped Continue allopurinol Pt reports side effects from steroids, will avoid prednisone    Normocytic anemia / Iron deficiency anemia Hbg stable. No signs of bleeding.  Normal iron studies, B12 and folate level in 01/25/23. Suspect myelosuppression related to alcohol abuse. Anemia panel  this admission shows iron deficiency, normal folate & b12 Started oral iron supplement Monitor CBC Will need outpatient GI referral for screening colonoscopy   Total bilirubin, elevated With right upper quadrant abdominal pain Abdominal ultrasound did not show any acute abdominal pathology Continue to monitor LFTs   AKI (acute kidney injury) (HCC)-improved Presented with creatinine of 1.34 which normalized Suspect secondary to poor p.o. intake Given fluids on admission & improved Monitor renal function closely Avoid nephrotoxic medications    Traumatic ecchymosis Mid lower back, and right mid inner thigh Present on admission, presumed secondary to falling in setting of resuming excessive alcohol intake They appear to be fading/healing as expected PT OT consulted - SNF recommended, pt declines as no bed offers locally due to his insurance.   Depression Generalized anxiety disorder Continue home sertraline 50 mg daily    Sinus tachycardia  - resolved.  Likely due to alcohol withdrawal and pain from gout flare.h home metoprolol Continue home metoprolol succinate 25 mg daily Metoprolol IV PRN   Hypertension Continue metoprolol succinate 25 mg daily            Consultants: None Procedures performed: None  Disposition:  Motel / home Diet recommendation:  Cardiac diet DISCHARGE MEDICATION: Allergies as of 06/01/2023   No Known Allergies      Medication List     TAKE these medications    albuterol 108 (90 Base) MCG/ACT inhaler Commonly known as: ProAir HFA Inhale 1-2 puffs into the lungs every 4 (four) hours as needed for wheezing or shortness of breath.   allopurinol 100 MG tablet Commonly known as: Zyloprim Take 1 tablet (100 mg total) by mouth daily.   chlordiazePOXIDE 5 MG capsule Commonly known as: LIBRIUM Take 3 capsules (15 mg total) by mouth 3 (three) times daily for 2 days, THEN 2 capsules (10 mg total) 3 (three) times daily for 2 days, THEN 1 capsule  (5 mg total) 3 (three) times daily for 2 days, THEN 1 capsule (5 mg total) 2 (two) times daily for 1 day, THEN 1 capsule (5 mg total) daily for 1 day. Start taking on: June 01, 2023   colchicine 0.6 MG tablet Take 1 tablet (0.6 mg total) by mouth 2 (two) times daily.   ferrous sulfate 325 (65 FE) MG tablet Take 1 tablet (325 mg total) by mouth daily with breakfast.   fluticasone-salmeterol 100-50 MCG/ACT Aepb Commonly known as: ADVAIR Inhale 1 puff into the lungs 2 (two) times daily.   folic acid 1 MG tablet Commonly known as: FOLVITE Take 1 tablet (1 mg total) by mouth daily.   HYDROcodone-acetaminophen 5-325 MG tablet Commonly known as: NORCO/VICODIN Take 1-2 tablets by mouth every 6 (six) hours as needed for moderate pain or severe pain.   lisinopril 40 MG tablet Commonly known as: ZESTRIL TAKE ONE TABLET BY MOUTH ONCE EVERY DAY.   metoprolol succinate 25 MG 24 hr tablet Commonly known as: Toprol XL Take 1 tablet (25 mg total) by mouth daily.   multivitamin with minerals Tabs tablet Take 1 tablet by mouth daily.   sertraline 50 MG tablet Commonly known as: ZOLOFT Take 1 tablet (50 mg  total) by mouth daily.   thiamine 100 MG tablet Commonly known as: Vitamin B-1 Take 1 tablet (100 mg total) by mouth daily.   traZODone 100 MG tablet Commonly known as: DESYREL Take 1 tablet (100 mg total) by mouth at bedtime as needed.        Follow-up Information     Jacky Kindle, FNP. Schedule an appointment as soon as possible for a visit.   Specialty: Family Medicine Why: Hospital follow up in 1-2 weeks Contact information: [address] [place] [address] [phone]                Discharge Exam: Ceasar Mons Weights   05/24/23 1300  Weight: 85.3 kg   General exam: awake, alert, no acute distress, chronically ill appearing HEENT: atraumatic, clear conjunctiva, anicteric sclera, moist mucus membranes, hearing grossly normal  Respiratory system: CTAB, no  wheezes, rales or rhonchi, normal respiratory effort. Cardiovascular system: normal S1/S2, RRR, no JVD, murmurs, rubs, gallops, no pedal edema.   Gastrointestinal system: soft, NT, ND, no HSM felt, +bowel sounds. Central nervous system: A&O x3. no gross focal neurologic deficits, normal speech Extremities: moves all, no edema, normal tone Skin: dry, intact, normal temperature Psychiatry: depressed mood, congruent affect, judgement and insight appear normal   Condition at discharge: stable  The results of significant diagnostics from this hospitalization (including imaging, microbiology, ancillary and laboratory) are listed below for reference.   Imaging Studies: US Abdomen Limited RUQ (LIVER/GB)  Result Date: 05/24/2023 CLINICAL DATA:  Elevated bilirubin level. Right upper quadrant abdominal pain. EXAM: ULTRASOUND ABDOMEN LIMITED RIGHT UPPER QUADRANT COMPARISON:  April 28, 2023.  September 05, 2021. FINDINGS: Gallbladder: Status post cholecystectomy. Common bile duct: Diameter: 3 mm which is within normal limits. Liver: No focal lesion identified. Within normal limits in parenchymal echogenicity. Portal vein is patent on color Doppler imaging with normal direction of blood flow towards the liver. Other: None. IMPRESSION: Status post cholecystectomy. No other abnormality seen in the right upper quadrant of the abdomen. Electronically Signed   By: Lupita Raider M.D.   On: 05/24/2023 16:55   DG Chest Portable 1 View  Result Date: 05/24/2023 CLINICAL DATA:  Chest pain. EXAM: PORTABLE CHEST 1 VIEW COMPARISON:  Chest x-ray dated April 28, 2023. FINDINGS: The heart size and mediastinal contours are within normal limits. Both lungs are clear. The visualized skeletal structures are unremarkable. IMPRESSION: No active disease. Electronically Signed   By: Obie Dredge M.D.   On: 05/24/2023 12:11    Microbiology: Results for orders placed or performed during the hospital encounter of 04/28/23  Blood  Culture (routine x 2)     Status: None   Collection Time: 04/28/23  7:42 PM   Specimen: BLOOD  Result Value Ref Range Status   Specimen Description BLOOD BLOOD RIGHT FOREARM  Final   Special Requests   Final    BOTTLES DRAWN AEROBIC AND ANAEROBIC Blood Culture results may not be optimal due to an excessive volume of blood received in culture bottles   Culture   Final    NO GROWTH 5 DAYS Performed at [facility], [address] [place], [place] [address]    Report Status 05/03/2023 FINAL  Final  Blood Culture (routine x 2)     Status: None   Collection Time: 04/28/23  8:03 PM   Specimen: BLOOD  Result Value Ref Range Status   Specimen Description BLOOD BLOOD LEFT ARM  Final   Special Requests   Final  BOTTLES DRAWN AEROBIC AND ANAEROBIC Blood Culture adequate volume   Culture   Final    NO GROWTH 5 DAYS Performed at [facility], [address] [place], [place] [address]    Report Status 05/03/2023 FINAL  Final    Labs: CBC: No results for input(s): "WBC", "NEUTROABS", "HGB", "HCT", "MCV", "PLT" in the last 168 hours.  Basic Metabolic Panel: No results for input(s): "NA", "K", "CL", "CO2", "GLUCOSE", "BUN", "CREATININE", "CALCIUM", "MG", "PHOS" in the last 168 hours.  Liver Function Tests: No results for input(s): "AST", "ALT", "ALKPHOS", "BILITOT", "PROT", "ALBUMIN" in the last 168 hours.  CBG: No results for input(s): "GLUCAP" in the last 168 hours.  Discharge time spent: greater than 30 minutes.  Signed: Pennie Banter, DO [facility] 06/09/2023

## 2023-06-01 NOTE — Progress Notes (Signed)
Occupational Therapy Treatment Patient Details Name: Miguel [name] MRN: [id number] DOB: 1961-10-18 Today's Date: 06/01/2023   History of present illness Pt is a 62 y/o M who presents with chest and right upper quadrant pain, attributed to alcohol abuse and AKI, counseling provided and medication. PMH significant for alcohol abuse, HTN, depression, anxiety, GERD, gout, OCD   OT comments  Pt seen for OT treatment on this date. Upon arrival to room pt supine, agreeable to OT Tx session. OT facilitated ADL management as described below. See ADL section for additional details regarding occupational performance. Pt continues to be functionally limited by psychosocial factors including increased anxiety, generalized weakness, mild executive functioning deficits with decreased safety awareness, impaired balance and increased risk for falls. Pt return verbalizes understanding of education provided t/o session. Pt emotional during OT session, OT provides therapeutic listening and support. Pt voices concerns regarding discharge as he would be leaving hospital for a motel as he cannot return home at this time due to family issues. Overall supervision is required for functional mobility and ADL participation. Completes bout of ~100 ft of functional mobility to work on household distance ambulation needed for ADL/IADL in home environment. BP 140/87 supine after tx. Pt does endorse seeing black specks and is visible anxious with shakiness. Pt is progressing toward OT goals and continues to benefit from skilled OT services to maximize return to PLOF and minimize risk of future falls, injury, caregiver burden, and readmission. Will continue to follow POC as written. Discharge recommendation remains appropriate.     Recommendations for follow up therapy are one component of a multi-disciplinary discharge planning process, led by the attending physician.  Recommendations may be updated based on patient status, additional  functional criteria and insurance authorization.    Assistance Recommended at Discharge Intermittent Supervision/Assistance  Patient can return home with the following  A little help with walking and/or transfers;A little help with bathing/dressing/bathroom;Assistance with cooking/housework;Direct supervision/assist for medications management;Direct supervision/assist for financial management;Assist for transportation;Help with stairs or ramp for entrance   Equipment Recommendations  Other (comment);BSC/3in1 (RW)       Precautions / Restrictions Precautions Precautions: Fall Restrictions Weight Bearing Restrictions: No       Mobility Bed Mobility Overal bed mobility: Modified Independent                  Transfers Overall transfer level: Needs assistance Equipment used: Rolling walker (2 wheels) Transfers: Sit to/from Stand Sit to Stand: Supervision           General transfer comment: cues to stand from EOB and push up from bed vs pull from RW     Balance Overall balance assessment: Needs assistance Sitting-balance support: Feet supported, No upper extremity supported Sitting balance-Leahy Scale: Good     Standing balance support: During functional activity, Reliant on assistive device for balance Standing balance-Leahy Scale: Good Standing balance comment: Heavy LUE reliance on walker due to pain in R elbow (gout?)                           ADL either performed or assessed with clinical judgement   ADL Overall ADL's : Needs assistance/impaired                                     Functional mobility during ADLs: Rolling walker (2 wheels);Cueing for safety;Supervision/safety General ADL Comments: Anxious, cues  for participation, close supervision for ADL task participation, mild executive functioning deficits. Completes bout of ~100 ft of functional mobility to work on household distance ambulation needed for ADL/IADL in home  environment.       Cognition Arousal/Alertness: Awake/alert Behavior During Therapy: Anxious Overall Cognitive Status: Within Functional Limits for tasks assessed                                 General Comments: Emotionally liable, discloses concerns with d/c due to family and living situation                   Pertinent Vitals/ Pain       Pain Assessment Pain Assessment: 0-10 Pain Score: 8  Pain Location: R elbow Pain Descriptors / Indicators: Constant, Aching, Grimacing Pain Intervention(s): Limited activity within patient's tolerance   Frequency  Min 1X/week        Progress Toward Goals  OT Goals(current goals can now be found in the care plan section)  Progress towards OT goals: Progressing toward goals  Acute Rehab OT Goals OT Goal Formulation: With patient Time For Goal Achievement: 06/09/23  Plan Discharge plan remains appropriate       AM-PAC OT "6 Clicks" Daily Activity     Outcome Measure   Help from another person eating meals?: None Help from another person taking care of personal grooming?: None Help from another person toileting, which includes using toliet, bedpan, or urinal?: A Little Help from another person bathing (including washing, rinsing, drying)?: A Little Help from another person to put on and taking off regular upper body clothing?: None Help from another person to put on and taking off regular lower body clothing?: None 6 Click Score: 22    End of Session Equipment Utilized During Treatment: Gait belt;Rolling walker (2 wheels)  OT Visit Diagnosis: Unsteadiness on feet (R26.81);Repeated falls (R29.6);Muscle weakness (generalized) (M62.81)   Activity Tolerance Patient tolerated treatment well   Patient Left in bed;with call bell/phone within reach;with bed alarm set   Nurse Communication          Time: 1610-9604 OT Time Calculation (min): 36 min  Charges: OT General Charges $OT Visit: 1 Visit OT  Treatments $Self Care/Home Management : 23-37 mins  Ruari Duggan L. Jessiah Wojnar, OTR/L  06/01/23, 11:52 AM

## 2023-06-01 NOTE — Progress Notes (Signed)
Mobility Specialist - Progress Note   06/01/23 1000  Mobility  Activity Ambulated with assistance in hallway  Level of Assistance Standby assist, set-up cues, supervision of patient - no hands on  Assistive Device Front wheel walker  Distance Ambulated (ft) 100 ft  Activity Response Tolerated well  $Mobility charge 1 Mobility     Pre-mobility: 70 HR, 128/81 supine, 121/84 sitting, 99% SpO2   Pt lying in bed upon arrival, utilizing RA. Pt agreeable to activity. Pt completed bed mobility independently. Reports feeling dizzy and shaky upon sitting; seeing black speckles. Dizziness does resolve some in standing. STS and ambulation with minG. No LOB. Increased distance to ~100' this date. Voiced pain in R knee and elbow during activity. Pt returned to bed with alarm set, needs in reach.    Filiberto Pinks Mobility Specialist 06/01/23, 10:48 AM

## 2023-06-01 NOTE — TOC Transition Note (Signed)
Transition of Care [facility]) - CM/SW Discharge Note   Patient Details  Name: Miguel [name] MRN: [id number] Date of Birth: [birth year]  [facility] CM/SW Contact:  Chapman Fitch, RN Phone Number: 06/01/2023, 3:03 PM   Clinical Narrative:     Patient to discharge today Patient states that he will be staying a the [facility] a few days before returning home  Provided patient change of clothes (shirt, pants, socks, underwear)  RW delivered to room by Cletis Athens with [employer]  Patient to pick up discharge medications from [facility]        Patient Goals and CMS Choice      Discharge Placement                         Discharge Plan and Services Additional resources added to the After Visit Summary for                                       Social Determinants of Health (SDOH) Interventions SDOH Screenings   Food Insecurity: No Food Insecurity (05/24/2023)  Housing: Low Risk  (05/24/2023)  Transportation Needs: No Transportation Needs (05/24/2023)  Utilities: Not At Risk (05/24/2023)  Alcohol Screen: Medium Risk (03/19/2021)  Depression (PHQ2-9): Low Risk  (04/16/2023)  Financial Resource Strain: High Risk (09/18/2020)  Physical Activity: Sufficiently Active (09/18/2020)  Social Connections: Socially Isolated (09/18/2020)  Stress: Stress Concern Present (09/18/2020)  Tobacco Use: Medium Risk (05/24/2023)     Readmission Risk Interventions    05/25/2023    4:31 PM 01/19/2023   12:40 PM  Readmission Risk Prevention Plan  Transportation Screening Complete Complete  Medication Review (RN Care Manager) Referral to Pharmacy Complete  PCP or Specialist appointment within 3-5 days of discharge Complete Complete  HRI or Home Care Consult Complete   SW Recovery Care/Counseling Consult Complete Complete  Palliative Care Screening Not Applicable Not Applicable  Skilled Nursing Facility Not Applicable Not Applicable

## 2023-06-01 NOTE — TOC Progression Note (Signed)
Transition of Care [facility]) - Progression Note    Patient Details  Name: Miguel [name] MRN: [id number] Date of Birth: [birth year]  Transition of Care [facility]) CM/SW Contact  Chapman Fitch, RN Phone Number: 06/01/2023, 12:15 PM  Clinical Narrative:      Team notified that per insurance there are not any local SNF in network, unable to secure home health services, patient declined outpatient therapy, patient declined DME.  TOC supervisor aware       Expected Discharge Plan and Services                                               Social Determinants of Health (SDOH) Interventions SDOH Screenings   Food Insecurity: No Food Insecurity (05/24/2023)  Housing: Low Risk  (05/24/2023)  Transportation Needs: No Transportation Needs (05/24/2023)  Utilities: Not At Risk (05/24/2023)  Alcohol Screen: Medium Risk (03/19/2021)  Depression (PHQ2-9): Low Risk  (04/16/2023)  Financial Resource Strain: High Risk (09/18/2020)  Physical Activity: Sufficiently Active (09/18/2020)  Social Connections: Socially Isolated (09/18/2020)  Stress: Stress Concern Present (09/18/2020)  Tobacco Use: Medium Risk (05/24/2023)    Readmission Risk Interventions    05/25/2023    4:31 PM 01/19/2023   12:40 PM  Readmission Risk Prevention Plan  Transportation Screening Complete Complete  Medication Review (RN Care Manager) Referral to Pharmacy Complete  PCP or Specialist appointment within 3-5 days of discharge Complete Complete  HRI or Home Care Consult Complete   SW Recovery Care/Counseling Consult Complete Complete  Palliative Care Screening Not Applicable Not Applicable  Skilled Nursing Facility Not Applicable Not Applicable

## 2023-06-01 NOTE — Plan of Care (Signed)
Adequate for discharge  Miguel [name]  

## 2023-06-01 NOTE — Progress Notes (Signed)
I have reviewed and concur with this student's documentation.   Leodis Sias, RN 06/01/2023 11:46 AM

## 2023-06-01 NOTE — Plan of Care (Signed)

## 2023-06-16 ENCOUNTER — Emergency Department: Payer: MEDICAID

## 2023-06-16 ENCOUNTER — Other Ambulatory Visit: Payer: Self-pay

## 2023-06-16 DIAGNOSIS — W01198A Fall on same level from slipping, tripping and stumbling with subsequent striking against other object, initial encounter: Secondary | ICD-10-CM | POA: Diagnosis not present

## 2023-06-16 DIAGNOSIS — S46911A Strain of unspecified muscle, fascia and tendon at shoulder and upper arm level, right arm, initial encounter: Secondary | ICD-10-CM | POA: Diagnosis not present

## 2023-06-16 DIAGNOSIS — S4991XA Unspecified injury of right shoulder and upper arm, initial encounter: Secondary | ICD-10-CM | POA: Diagnosis present

## 2023-06-16 NOTE — ED Triage Notes (Signed)
Pt presents to ER via ems with c/o right shoulder pain.  Pt states he fell backwards into a door, hitting hit right shoulder, and left posterior head.  Denies LOC. Pt endorses drinking 2 beers, and 2 malt liquors prior to the fall.  Pt otherwise alert and in NAD at this time.

## 2023-06-17 ENCOUNTER — Emergency Department
Admission: EM | Admit: 2023-06-17 | Discharge: 2023-06-17 | Disposition: A | Discharge status: Home / Self Care | Payer: MEDICAID | Attending: Emergency Medicine | Admitting: Emergency Medicine

## 2023-06-17 DIAGNOSIS — M25511 Pain in right shoulder: Secondary | ICD-10-CM

## 2023-06-17 DIAGNOSIS — S46911A Strain of unspecified muscle, fascia and tendon at shoulder and upper arm level, right arm, initial encounter: Secondary | ICD-10-CM

## 2023-06-17 MED ORDER — DICLOFENAC SODIUM 1 % EX GEL: 4.0000 g | Freq: Three times a day (TID) | CUTANEOUS | 0 refills | Status: DC | PRN

## 2023-06-17 MED ORDER — KETOROLAC TROMETHAMINE 60 MG/2ML IM SOLN
60.0000 mg | Freq: Once | INTRAMUSCULAR | Status: AC
  Administered 2023-06-17: 60 mg via INTRAMUSCULAR
  Filled 2023-06-17: qty 2

## 2023-06-17 MED ORDER — NAPROXEN 375 MG PO TABS: 375.0000 mg | ORAL_TABLET | Freq: Two times a day (BID) | ORAL | 0 refills | Status: AC

## 2023-06-17 NOTE — ED Provider Notes (Signed)
[facility] Provider Note    Event Date/Time   First MD Initiated Contact with Patient 06/17/23 0011     (approximate)   History   Shoulder Pain   HPI  Miguel [name] is a 62 y.o. male  here with shoulder pain. Pt reports that over the past 24 hr he has had aching, throbbing, R shoulder pain. Worse with any movement, palpation. Feels like gout but has not had gout in this joint. Denies any falls. Initially couldn't recall any increased activity but remembered he had worked out "a lot" yesterday. Has not taken anything. No numbness or weakness.       Physical Exam   Triage Vital Signs: ED Triage Vitals  Encounter Vitals Group     BP 06/16/23 2240 128/84     Systolic BP Percentile --      Diastolic BP Percentile --      Pulse Rate 06/16/23 2240 97     Resp 06/16/23 2240 19     Temp 06/16/23 2240 97.7 F (36.5 C)     Temp Source 06/16/23 2240 Oral     SpO2 06/16/23 2240 100 %     Weight --      Height --      Head Circumference --      Peak Flow --      Pain Score 06/16/23 2245 10     Pain Loc --      Pain Education --      Exclude from Growth Chart --     Most recent vital signs: Vitals:   06/16/23 2240  BP: 128/84  Pulse: 97  Resp: 19  Temp: 97.7 F (36.5 C)  SpO2: 100%     General: Awake, no distress.  CV:  Good peripheral perfusion.  Resp:  Normal work of breathing.  Abd:  No distention.  Other:  Moderate TTP over R anterior shoulder and trapezius, no swelling or warmth. Distal strength/sensation is fully intact. 2+ radial and DP pulses. No other joint swelling or warmth.   ED Results / Procedures / Treatments   Labs (all labs ordered are listed, but only abnormal results are displayed) Labs Reviewed - No data to display   EKG    RADIOLOGY DG Shoulder R: Negative   I also independently reviewed and agree with radiologist interpretations.   PROCEDURES:  Critical Care performed: No   MEDICATIONS ORDERED IN  ED: Medications  ketorolac (TORADOL) injection 60 mg (60 mg Intramuscular Given 06/17/23 0103)     IMPRESSION / MDM / ASSESSMENT AND PLAN / ED COURSE  I reviewed the triage vital signs and the nursing notes.                              Differential diagnosis includes, but is not limited to, shoulder arthritis, sprain, overuse injury, inflammatory/gouty arthritis  Patient's presentation is most consistent with acute complicated illness / injury requiring diagnostic workup.  The patient is on the cardiac monitor to evaluate for evidence of arrhythmia and/or significant heart rate changes  62 yo M here with R shoulder pain. Plain films are negative. No joint warmth, swelling. No signs of septic or inflammatory arthritis clinically. Pt does recall increased activity/lifting weights yesterday so suspect mild strain/sprain 2/2 overuse. NSAIDs, supportive care. Return precautions given.    FINAL CLINICAL IMPRESSION(S) / ED DIAGNOSES   Final diagnoses:  Acute pain of right shoulder  Strain  of right shoulder, initial encounter     Rx / DC Orders   ED Discharge Orders          Ordered    naproxen (NAPROSYN) 375 MG tablet  2 times daily with meals        06/17/23 0130    diclofenac Sodium (VOLTAREN) 1 % GEL  3 times daily PRN        06/17/23 0130             Note:  This document was prepared using Dragon voice recognition software and may include unintentional dictation errors.   Shaune Pollack, MD 06/17/23 [phone]

## 2023-06-18 ENCOUNTER — Ambulatory Visit (INDEPENDENT_AMBULATORY_CARE_PROVIDER_SITE_OTHER): Payer: MEDICAID | Admitting: Physician Assistant

## 2023-06-18 ENCOUNTER — Ambulatory Visit: Payer: Self-pay | Admitting: *Deleted

## 2023-06-18 ENCOUNTER — Encounter: Payer: Self-pay | Admitting: Physician Assistant

## 2023-06-18 VITALS — BP 140/90 | HR 99 | Temp 98.2°F | Ht 72.0 in | Wt 183.6 lb

## 2023-06-18 DIAGNOSIS — R0602 Shortness of breath: Secondary | ICD-10-CM | POA: Diagnosis not present

## 2023-06-18 DIAGNOSIS — F1010 Alcohol abuse, uncomplicated: Secondary | ICD-10-CM | POA: Diagnosis not present

## 2023-06-18 DIAGNOSIS — M109 Gout, unspecified: Secondary | ICD-10-CM | POA: Diagnosis not present

## 2023-06-18 MED ORDER — NALTREXONE HCL 50 MG PO TABS: 50.0000 mg | ORAL_TABLET | Freq: Every day | ORAL | 0 refills | Status: DC

## 2023-06-18 NOTE — Telephone Encounter (Signed)
  Chief Complaint: pain- shoulder injury/bruising  Symptoms: recent fall with injury to R shoulder - severe pain- unable to sleep or move arm Frequency: patient fell 2 days ago Pertinent Negatives: Patient denies loss of sensation  Disposition: [] ED /[] Urgent Care (no appt availability in office) / [x] Appointment(In office/virtual)/ []  [place] [facility]/ [] Home Care/ [] Refused Recommended Disposition /[]  Mobile Bus/ []  Follow-up with PCP Additional Notes: Patient states he needs help with pain- not able to sleep or move arm. He did go to ED- but states what happened at visit is not clear to him

## 2023-06-18 NOTE — Telephone Encounter (Signed)
Reason for Disposition  [1] SEVERE pain AND [2] not improved 2 hours after pain medicine/ice packs  Answer Assessment - Initial Assessment Questions 1. MECHANISM: "How did the injury happen?"     Fell- passed out/fainted- R shoulder injury 2. ONSET: "When did the injury happen?" (Minutes or hours ago)      2 days ago 3. APPEARANCE of INJURY: "What does the injury look like?"      Bruising R arm, shoulder, neck 4. SEVERITY: "Can you move the shoulder normally?"      no 5. SIZE: For cuts, bruises, or swelling, ask: "How large is it?" (e.g., inches or centimeters;  entire joint)      Bruising present , hard to tell 6. PAIN: "Is there pain?" If Yes, ask: "How bad is the pain?"   (e.g., Scale 1-10; or mild, moderate, severe)   - MILD (1-3): doesn't interfere with normal activities   - MODERATE (4-7): interferes with normal activities (e.g., work or school) or awakens from sleep   - SEVERE (8-10): excruciating pain, unable to do any normal activities, unable to move arm at all due to pain     severe 7. TETANUS: For any breaks in the skin, ask: "When was the last tetanus booster?"     na 8. OTHER SYMPTOMS: "Do you have any other symptoms?" (e.g., loss of sensation)     no  Protocols used: Shoulder Injury-A-AH

## 2023-06-20 NOTE — Progress Notes (Signed)
Established patient visit  Patient: Miguel [name]   DOB: 06-28-1961   62 y.o. Male  MRN: [id number] Visit Date: 06/18/2023  Today's healthcare provider: Debera Lat, PA-C   Chief Complaint  Patient presents with   Follow-up    Follow up on right shoulder, went to ER 2 days ago, was givin an injection and trazodone    Subjective     Discussed the use of AI scribe software for clinical note transcription with the patient, who gave verbal consent to proceed.  History of Present Illness   The patient, with a history of alcohol abuse and gout, presents with shoulder pain following a recent fall. The pain is severe, limiting the patient's range of motion and causing discomfort during breathing. The patient reports that the pain is worse than previous experiences with gout in the feet and knees. The patient has been managing the pain with naproxen and Tylenol, but reports minimal relief. The patient also mentions a previous fall, which resulted in a hospital visit.  The patient's alcohol consumption is a concern, as it has led to hospitalization for withdrawal and is likely contributing to the patient's falls. The patient admits to recent alcohol consumption, despite previous attempts to quit. 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  The patient is currently on medication for gout, including allopurinol and colchicine. The patient also mentions taking a medication for alcohol abuse, but there seems to be some confusion about the medication's name and whether it should still be taken.           04/16/2023    3:07 PM 10/29/2022    5:13 PM 10/14/2022   11:13 AM  Depression screen PHQ 2/9  Decreased Interest 0 1 1  Down, Depressed, Hopeless 0 1 1  PHQ - 2 Score 0 2 2  Altered sleeping 0 1 1  Tired, decreased energy 1 1 1   Change in appetite 1 0 0  Feeling bad or failure about yourself  0 0 0   Trouble concentrating 0 0 2  Moving slowly or fidgety/restless 0 0 0  Suicidal thoughts 0 0 0  PHQ-9 Score 2 4 6   Difficult doing work/chores Not difficult at all Somewhat difficult Somewhat difficult      10/29/2022    5:13 PM 10/14/2022   11:12 AM 09/22/2022    2:14 PM 09/09/2022   12:15 PM  GAD 7 : Generalized Anxiety Score  Nervous, Anxious, on Edge 2 2 2 2   Control/stop worrying 3 3 3 3   Worry too much - different things 3 3 3 3   Trouble relaxing 2 2 3 2   Restless 2 2 2 2   Easily annoyed or irritable 2 2 3 2   Afraid - awful might happen 2 2 2 1   Total GAD 7 Score 16 16 18 15   Anxiety Difficulty Very difficult Very difficult  Very difficult    Medications: Outpatient Medications Prior to Visit  Medication Sig   albuterol (PROAIR HFA) 108 (90 Base) MCG/ACT inhaler Inhale 1-2 puffs into the lungs every 4 (four) hours as needed for wheezing or shortness of breath.   allopurinol (ZYLOPRIM) 100 MG tablet Take 1 tablet (100 mg total) by mouth daily.   colchicine 0.6 MG tablet Take 1 tablet (0.6 mg total) by mouth 2 (two) times daily.   diclofenac Sodium (VOLTAREN) 1 % GEL Apply 4 g topically 3 (three) times daily  as needed (joint aches/pain).   ferrous sulfate 325 (65 FE) MG tablet Take 1 tablet (325 mg total) by mouth daily with breakfast.   fluticasone-salmeterol (ADVAIR) 100-50 MCG/ACT AEPB Inhale 1 puff into the lungs 2 (two) times daily.   folic acid (FOLVITE) 1 MG tablet Take 1 tablet (1 mg total) by mouth daily.   lisinopril (ZESTRIL) 40 MG tablet TAKE ONE TABLET BY MOUTH ONCE EVERY DAY.   metoprolol succinate (TOPROL XL) 25 MG 24 hr tablet Take 1 tablet (25 mg total) by mouth daily.   Multiple Vitamin (MULTIVITAMIN WITH MINERALS) TABS tablet Take 1 tablet by mouth daily.   naproxen (NAPROSYN) 375 MG tablet Take 1 tablet (375 mg total) by mouth 2 (two) times daily with a meal for 7 days.   sertraline (ZOLOFT) 50 MG tablet Take 1 tablet (50 mg total) by mouth daily.    thiamine (VITAMIN B-1) 100 MG tablet Take 1 tablet (100 mg total) by mouth daily.   traZODone (DESYREL) 100 MG tablet Take 1 tablet (100 mg total) by mouth at bedtime as needed.   HYDROcodone-acetaminophen (NORCO/VICODIN) 5-325 MG tablet Take 1-2 tablets by mouth every 6 (six) hours as needed for moderate pain or severe pain. (Patient not taking: Reported on 06/18/2023)   No facility-administered medications prior to visit.    Review of Systems  All other systems reviewed and are negative.  Except see HPI        Objective    BP (!) 140/90 (BP Location: Left Arm, Patient Position: Sitting, Cuff Size: Large)   Pulse 99   Temp 98.2 F (36.8 C) (Oral)   Ht 6' (1.829 m)   Wt 183 lb 9.6 oz (83.3 kg)   SpO2 100%   BMI 24.90 kg/m      Physical Exam   Results for orders placed or performed in visit on 06/18/23  CBC with Differential/Platelet  Result Value Ref Range   WBC 12.6 (H) 3.4 - 10.8 x10E3/uL   RBC 3.96 (L) 4.14 - 5.80 x10E6/uL   Hemoglobin 11.0 (L) 13.0 - 17.7 g/dL   Hematocrit 28.4 (L) 13.2 - 51.0 %   MCV 87 79 - 97 fL   MCH 27.8 26.6 - 33.0 pg   MCHC 31.9 31.5 - 35.7 g/dL   RDW 44.0 10.2 - 72.5 %   Platelets 235 150 - 450 x10E3/uL   Neutrophils 76 Not Estab. %   Lymphs 8 Not Estab. %   Monocytes 14 Not Estab. %   Eos 1 Not Estab. %   Basos 0 Not Estab. %   Neutrophils Absolute 9.5 (H) 1.4 - 7.0 x10E3/uL   Lymphocytes Absolute 1.0 0.7 - 3.1 x10E3/uL   Monocytes Absolute 1.8 (H) 0.1 - 0.9 x10E3/uL   EOS (ABSOLUTE) 0.2 0.0 - 0.4 x10E3/uL   Basophils Absolute 0.1 0.0 - 0.2 x10E3/uL   Immature Granulocytes 1 Not Estab. %   Immature Grans (Abs) 0.1 0.0 - 0.1 x10E3/uL  Basic metabolic panel  Result Value Ref Range   Glucose 109 (H) 70 - 99 mg/dL   BUN 11 8 - 27 mg/dL   Creatinine, Ser 3.66 0.76 - 1.27 mg/dL   eGFR 85 >44 IH/KVQ/2.59   BUN/Creatinine Ratio 11 10 - 24   Sodium 138 134 - 144 mmol/L   Potassium 3.9 3.5 - 5.2 mmol/L   Chloride 100 96 - 106 mmol/L    CO2 23 20 - 29 mmol/L   Calcium 8.9 8.6 - 10.2 mg/dL  Magnesium  Result  Value Ref Range   Magnesium 1.4 (L) 1.6 - 2.3 mg/dL    Assessment & Plan     Shoulder Pain Acute problem after a fall on 06/16/23 when he"fell backwards into a door, hitting right shoulder and left posterior head after drinking 2 beers and 2 mal liquors." Recent fall with subsequent shoulder pain. X-ray from 06/17/23 negative for fracture. Currently on Naproxen for pain management. Limited range of motion due to pain. -Continue Naproxen as prescribed. -Consider heat and cold therapy. -Rest and avoid strenuous activity with the affected shoulder.  Alcohol Use Disorder Chronic condition Uncontrolled. Recent hospitalization for alcohol-related issues. Patient reports recent alcohol consumption and a fall related to alcohol intoxication. -Continue Naltrexone as prescribed as he was done before. Rx will be called in after discussion with his PCP who will be managing this pt further. -Encourage cessation of alcohol use. -Schedule follow-up with primary care provider in 3 months to monitor progress.  Gout Currently on Allopurinol and Colchicine -Continue Allopurinol and Colchicine as prescribed as his most recent uric acid is 7+  General Health Maintenance -Order blood work today as requested by the hospital. -Consider chest x-ray to rule out rib fracture related to recent fall.     Return in about 3 months (around 09/18/2023) for chronic disease f/u, alcohol abuse.     The patient was advised to call back or seek an in-person evaluation if the symptoms worsen or if the condition fails to improve as anticipated.  I discussed the assessment and treatment plan with the patient. The patient was provided an opportunity to ask questions and all were answered. The patient agreed with the plan and demonstrated an understanding of the instructions.  I, Debera Lat, PA-C have reviewed all documentation for this visit. The  documentation on  06/18/23 for the exam, diagnosis, procedures, and orders are all accurate and complete.  Debera Lat, [facility], MMS [facility] [phone] (phone) [phone] (fax)   [facility]

## 2023-06-27 NOTE — Progress Notes (Signed)
Patient called.    Left message

## 2023-06-28 ENCOUNTER — Telehealth: Payer: MEDICAID | Admitting: Physician Assistant

## 2023-06-28 ENCOUNTER — Ambulatory Visit: Payer: Self-pay

## 2023-06-28 DIAGNOSIS — M10061 Idiopathic gout, right knee: Secondary | ICD-10-CM

## 2023-06-28 MED ORDER — METHYLPREDNISOLONE 4 MG PO TBPK: ORAL_TABLET | ORAL | 0 refills | Status: DC

## 2023-06-28 NOTE — Telephone Encounter (Signed)
  Chief Complaint: Gout in knee Symptoms: pain swelling redness - nausea - not eating Frequency: 3 days Pertinent Negatives: Patient denies  Disposition: [] ED /[] Urgent Care (no appt availability in office) / [] Appointment(In office/virtual)/ [x]  [place] [facility]/ [] Home Care/ [] Refused Recommended Disposition /[] [place] [facility]/ []  Follow-up with PCP Additional Notes: Pt reports gout flare for the past 3 days. Pt has been taking colchicine w/o relief. Pt is in extreme pain. No appts in office this evening. Pt unable to travel VV scheduled.  Summary: right knee discomfort / rx req   The patient has experienced right knee discomfort for roughly 3 days with little to no relief  The patient has been taking colchicine 0.6 MG tablet [[id number]] with little to no effect  The patient would like to be prescribed something to help with their knee discomfort  The patient shares that they have been unable to eat as well due to their discomfort  The patient has a history of gout concerns  Please contact when available     Reason for Disposition  [1] SEVERE pain (e.g., excruciating, unable to walk) AND [2] not improved after 2 hours of pain medicine  Answer Assessment - Initial Assessment Questions 1. LOCATION and RADIATION: "Where is the pain located?"      Right knee 2. QUALITY: "What does the pain feel like?"  (e.g., sharp, dull, aching, burning)     sharp 3. SEVERITY: "How bad is the pain?" "What does it keep you from doing?"   (Scale 1-10; or mild, moderate, severe)   -  MILD (1-3): doesn't interfere with normal activities    -  MODERATE (4-7): interferes with normal activities (e.g., work or school) or awakens from sleep, limping    -  SEVERE (8-10): excruciating pain, unable to do any normal activities, unable to walk     severe 4. ONSET: "When did the pain start?" "Does it come and go, or is it there all the time?"     3 days 5. RECURRENT: "Have you had this pain  before?" If Yes, ask: "When, and what happened then?"     yes 6. SETTING: "Has there been any recent work, exercise or other activity that involved that part of the body?"      Gout  8. ASSOCIATED SYMPTOMS: "Is there any swelling or redness of the knee?"     Swollen red 9. OTHER SYMPTOMS: "Do you have any other symptoms?" (e.g., chest pain, difficulty breathing, fever, calf pain)     Nausea - not eating.  Protocols used: Knee Pain-A-AH

## 2023-06-28 NOTE — Progress Notes (Signed)
Virtual Visit Consent   [name], you are scheduled for a virtual visit with a [facility] provider today. Just as with appointments in the office, your consent must be obtained to participate. Your consent will be active for this visit and any virtual visit you may have with one of our providers in the next 365 days. If you have a MyChart account, a copy of this consent can be sent to you electronically.  As this is a virtual visit, video technology does not allow for your provider to perform a traditional examination. This may limit your provider's ability to fully assess your condition. If your provider identifies any concerns that need to be evaluated in person or the need to arrange testing (such as labs, EKG, etc.), we will make arrangements to do so. Although advances in technology are sophisticated, we cannot ensure that it will always work on either your end or our end. If the connection with a video visit is poor, the visit may have to be switched to a telephone visit. With either a video or telephone visit, we are not always able to ensure that we have a secure connection.  By engaging in this virtual visit, you consent to the provision of healthcare and authorize for your insurance to be billed (if applicable) for the services provided during this visit. Depending on your insurance coverage, you may receive a charge related to this service.  I need to obtain your verbal consent now. Are you willing to proceed with your visit today? Miguel [name] has provided verbal consent on 06/28/2023 for a virtual visit (video or telephone). Margaretann Loveless, PA-C  Date: 06/28/2023 5:50 PM  Virtual Visit via Video Note   I, Margaretann Loveless, connected with  Miguel [name]  ([id number], 1960/12/09) on 06/28/23 at  5:30 PM EDT by a video-enabled telemedicine application and verified that I am speaking with the correct person using two identifiers.  Location: Patient: Virtual Visit Location Patient:  Home Provider: Virtual Visit Location Provider: Home Office   I discussed the limitations of evaluation and management by telemedicine and the availability of in person appointments. The patient expressed understanding and agreed to proceed.    History of Present Illness: Miguel [name] is a 62 y.o. who identifies as a male who was assigned male at birth, and is being seen today for gout.  HPI: Knee Pain  The incident occurred 3 to 5 days ago (3 days). There was no injury mechanism. The pain is present in the right knee (known history of gout, ran out of allopurinol). The quality of the pain is described as aching. The pain is moderate. The pain has been Constant since onset. Associated symptoms include an inability to bear weight and muscle weakness. Pertinent negatives include no loss of motion, loss of sensation, numbness or tingling. He reports no foreign bodies present. The symptoms are aggravated by movement. He has tried acetaminophen and NSAIDs (colchicine) for the symptoms. The treatment provided no relief.    Problems:  Patient Active Problem List   Diagnosis Date Noted   Iron deficiency 05/28/2023   Alcohol withdrawal with inpatient treatment (HCC) 05/24/2023   Total bilirubin, elevated 05/24/2023   Traumatic ecchymosis 05/24/2023   Alcoholic ketoacidosis 04/29/2023   Hypokalemia 04/29/2023   Hypocalcemia 04/29/2023   Essential hypertension 04/29/2023   Depression 04/29/2023   Syncope 04/28/2023   Encounter for screening for HIV 04/16/2023   Encounter for hepatitis C screening test for low risk  patient 04/16/2023   Establishing care with new doctor, encounter for 04/16/2023   Hyperthyroidism 04/16/2023   Elevated serum glucose 04/16/2023   Mixed hyperlipidemia 04/16/2023   Centrilobular emphysema (HCC) 04/16/2023   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) 04/16/2023   Orthostatic hypotension 11/28/2020   Genetic testing 07/15/2020   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    Hx of colonic polyps    Generalized anxiety disorder 10/14/2019   Alcoholic cirrhosis of liver without ascites (HCC) 06/14/2019   Alcohol abuse 06/14/2019   AKI (acute kidney injury) (HCC) 05/27/2019   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   Hypertension 12/05/2015   Sinus tachycardia 12/05/2015   Gout 11/13/2015    Allergies: No Known Allergies Medications:  Current Outpatient Medications:    methylPREDNISolone (MEDROL DOSEPAK) 4 MG TBPK tablet, 6 day taper; take as directed on package instructions, Disp: 21 tablet, Rfl: 0   albuterol (PROAIR HFA) 108 (90 Base) MCG/ACT inhaler, Inhale 1-2 puffs into the lungs every 4 (four) hours as needed for wheezing or shortness of breath., Disp: 18 g, Rfl: 11   allopurinol (ZYLOPRIM) 100 MG tablet, Take 1 tablet (100 mg total) by mouth daily., Disp: 90 tablet, Rfl: 3   colchicine 0.6 MG tablet, Take 1 tablet (0.6 mg total) by mouth 2 (two) times daily., Disp: 60 tablet, Rfl: 0   diclofenac Sodium (VOLTAREN) 1 % GEL, Apply 4 g topically 3 (three) times daily as needed (joint aches/pain)., Disp: 100 g, Rfl: 0   ferrous sulfate 325 (65 FE) MG tablet, Take 1 tablet (325 mg total) by mouth daily with breakfast., Disp: 30 tablet, Rfl: 1   fluticasone-salmeterol (ADVAIR) 100-50 MCG/ACT AEPB, Inhale 1 puff into the lungs 2 (two) times daily., Disp: 60 each, Rfl: 11   folic acid (FOLVITE) 1 MG tablet, Take 1 tablet (1 mg total) by mouth daily., Disp: 30 tablet, Rfl: 1   HYDROcodone-acetaminophen (NORCO/VICODIN) 5-325 MG tablet, Take 1-2 tablets by mouth every 6 (six) hours as needed for moderate pain or severe pain. (Patient not taking: Reported on 06/18/2023), Disp: 20 tablet, Rfl: 0   lisinopril (ZESTRIL) 40 MG tablet, TAKE ONE TABLET BY MOUTH ONCE EVERY DAY., Disp: 90 tablet, Rfl: 3   metoprolol succinate (TOPROL XL) 25 MG 24 hr tablet, Take 1 tablet (25 mg total) by mouth daily., Disp: 90 tablet, Rfl: 3    Multiple Vitamin (MULTIVITAMIN WITH MINERALS) TABS tablet, Take 1 tablet by mouth daily., Disp: , Rfl:    naltrexone (DEPADE) 50 MG tablet, Take 1 tablet (50 mg total) by mouth daily., Disp: 90 tablet, Rfl: 0   sertraline (ZOLOFT) 50 MG tablet, Take 1 tablet (50 mg total) by mouth daily., Disp: 90 tablet, Rfl: 3   thiamine (VITAMIN B-1) 100 MG tablet, Take 1 tablet (100 mg total) by mouth daily., Disp: 30 tablet, Rfl: 1   traZODone (DESYREL) 100 MG tablet, Take 1 tablet (100 mg total) by mouth at bedtime as needed., Disp: 90 tablet, Rfl: 3   Observations/Objective: Patient is well-developed, well-nourished in no acute distress.  Resting comfortably at home.  Head is normocephalic, atraumatic.  No labored breathing.  Speech is clear and coherent with logical content.  Patient is alert and oriented at baseline.    Assessment and Plan: 1. Acute idiopathic gout of right knee - methylPREDNISolone (MEDROL DOSEPAK) 4 MG TBPK tablet; 6 day taper; take as directed on package instructions  Dispense: 21 tablet; Refill: 0  - Suspect  acute on chronic gout flare in right knee - Failed NSAIDs - Add Medrol dose pack  - Avoid NSAIDs (Ibuprofen/Advil/Motrin or Naproxen/Aleve) while on steroid - Tylenol if okay for breakthrough pain - Heat to area - Epsom salt soak if able to get in and out of bath tub safely - Restart Allopurinol - Seek in person evaluation if worsening or fails to improve with treatment   Follow Up Instructions: I discussed the assessment and treatment plan with the patient. The patient was provided an opportunity to ask questions and all were answered. The patient agreed with the plan and demonstrated an understanding of the instructions.  A copy of instructions were sent to the patient via MyChart unless otherwise noted below.    The patient was advised to call back or seek an in-person evaluation if the symptoms worsen or if the condition fails to improve as  anticipated.  Time:  I spent 15 minutes with the patient via telehealth technology discussing the above problems/concerns.    Margaretann Loveless, PA-C

## 2023-06-28 NOTE — Patient Instructions (Signed)
Miguel [name], thank you for joining Margaretann Loveless, PA-C for today's virtual visit.  While this provider is not your primary care provider (PCP), if your PCP is located in our provider database this encounter information will be shared with them immediately following your visit.   A [facility] MyChart account gives you access to today's visit and all your visits, tests, and labs performed at [facility] " click here if you [facility] have a  MyChart account or go to [email or web address]  Consent: (Patient) Miguel [name] provided verbal consent for this virtual visit at the beginning of the encounter.  Current Medications:  Current Outpatient Medications:    methylPREDNISolone (MEDROL DOSEPAK) 4 MG TBPK tablet, 6 day taper; take as directed on package instructions, Disp: 21 tablet, Rfl: 0   albuterol (PROAIR HFA) 108 (90 Base) MCG/ACT inhaler, Inhale 1-2 puffs into the lungs every 4 (four) hours as needed for wheezing or shortness of breath., Disp: 18 g, Rfl: 11   allopurinol (ZYLOPRIM) 100 MG tablet, Take 1 tablet (100 mg total) by mouth daily., Disp: 90 tablet, Rfl: 3   colchicine 0.6 MG tablet, Take 1 tablet (0.6 mg total) by mouth 2 (two) times daily., Disp: 60 tablet, Rfl: 0   diclofenac Sodium (VOLTAREN) 1 % GEL, Apply 4 g topically 3 (three) times daily as needed (joint aches/pain)., Disp: 100 g, Rfl: 0   ferrous sulfate 325 (65 FE) MG tablet, Take 1 tablet (325 mg total) by mouth daily with breakfast., Disp: 30 tablet, Rfl: 1   fluticasone-salmeterol (ADVAIR) 100-50 MCG/ACT AEPB, Inhale 1 puff into the lungs 2 (two) times daily., Disp: 60 each, Rfl: 11   folic acid (FOLVITE) 1 MG tablet, Take 1 tablet (1 mg total) by mouth daily., Disp: 30 tablet, Rfl: 1   HYDROcodone-acetaminophen (NORCO/VICODIN) 5-325 MG tablet, Take 1-2 tablets by mouth every 6 (six) hours as needed for moderate pain or severe pain. (Patient not taking: Reported on 06/18/2023), Disp: 20 tablet,  Rfl: 0   lisinopril (ZESTRIL) 40 MG tablet, TAKE ONE TABLET BY MOUTH ONCE EVERY DAY., Disp: 90 tablet, Rfl: 3   metoprolol succinate (TOPROL XL) 25 MG 24 hr tablet, Take 1 tablet (25 mg total) by mouth daily., Disp: 90 tablet, Rfl: 3   Multiple Vitamin (MULTIVITAMIN WITH MINERALS) TABS tablet, Take 1 tablet by mouth daily., Disp: , Rfl:    naltrexone (DEPADE) 50 MG tablet, Take 1 tablet (50 mg total) by mouth daily., Disp: 90 tablet, Rfl: 0   sertraline (ZOLOFT) 50 MG tablet, Take 1 tablet (50 mg total) by mouth daily., Disp: 90 tablet, Rfl: 3   thiamine (VITAMIN B-1) 100 MG tablet, Take 1 tablet (100 mg total) by mouth daily., Disp: 30 tablet, Rfl: 1   traZODone (DESYREL) 100 MG tablet, Take 1 tablet (100 mg total) by mouth at bedtime as needed., Disp: 90 tablet, Rfl: 3   Medications ordered in this encounter:  Meds ordered this encounter  Medications   methylPREDNISolone (MEDROL DOSEPAK) 4 MG TBPK tablet    Sig: 6 day taper; take as directed on package instructions    Dispense:  21 tablet    Refill:  0    Order Specific Question:   Supervising Provider    Answer:   Merrilee Jansky [[id number]]     *If you need refills on other medications prior to your next appointment, please contact your pharmacy*  Follow-Up: Call back or seek an in-person evaluation if the symptoms worsen or if  the condition fails to improve as anticipated.  [facility] [phone]  Other Instructions  Low-Purine Eating Plan for Gout A low-purine eating plan involves making food choices to limit your purine intake. Purine is a kind of uric acid. Too much uric acid in your blood can cause certain conditions, such as gout and kidney stones. Eating a low-purine diet may help control these conditions. What are tips for following this plan? Shopping Avoid buying products that contain high-fructose corn syrup. Check for this on food labels. It is commonly found in many processed foods and soft drinks.  Be sure to check for it in baked goods such as cookies, canned fruits, and cereals and cereal bars. Avoid buying veal, chicken breast with skin, lamb, and organ meats such as liver. These types of meats tend to have the highest purine content. Choose dairy products. These may lower uric acid levels. Avoid certain types of fish. Not all fish and seafood have high purine content. Examples with high purine content include anchovies, trout, tuna, sardines, and salmon. Avoid buying beverages that contain alcohol, particularly beer and hard liquor. Alcohol can affect the way your body gets rid of uric acid. Meal planning  Learn which foods do or do not affect you. If you find out that a food tends to cause your gout symptoms to flare up, avoid eating that food. You can enjoy foods that do not cause problems. If you have any questions about a food item, talk with your dietitian or health care provider. Reduce the overall amount of meat in your diet. When you do eat meat, choose ones with lower purine content. Include plenty of fruits and vegetables. Although some vegetables may have a high purine content--such as asparagus, mushrooms, spinach, or cauliflower--it has been shown that these do not contribute to uric acid blood levels as much. Consume at least 1 dairy serving a day. This has been shown to decrease uric acid levels. General information If you drink alcohol: Limit how much you have to: 0-1 drink a day for women who are not pregnant. 0-2 drinks a day for men. Know how much alcohol is in a drink. In the [place] one drink equals one 12 oz bottle of beer (355 mL), one 5 oz glass of wine (148 mL), or one 1 oz glass of hard liquor (44 mL). Drink plenty of water. Try to drink enough to keep your urine pale yellow. Fluids can help remove uric acid from your body. Work with your health care provider and dietitian to develop a plan to achieve or maintain a healthy weight. Losing weight may help reduce  uric acid in your blood. What foods are recommended? The following are some types of foods that are good choices when limiting purine intake: Fresh or frozen fruits and vegetables. Whole grains, breads, cereals, and pasta. Rice. Beans, peas, legumes. Nuts and seeds. Dairy products. Fats and oils. The items listed above may not be a complete list. Talk with a dietitian about what dietary choices are best for you. What foods are not recommended? Limit your intake of foods high in purines, including: Beer and other alcohol. Meat-based gravy or sauce. Canned or fresh fish, such as: Anchovies, sardines, herring, salmon, and tuna. Mussels and scallops. Codfish, trout, and haddock. Bacon, veal, chicken breast with skin, and lamb. Organ meats, such as: Liver or kidney. Tripe. Sweetbreads (thymus gland or pancreas). Wild [employer] officer, environmental. Yeast or yeast extract supplements. Drinks sweetened with high-fructose corn syrup, such  as soda. Processed foods made with high-fructose corn syrup. The items listed above may not be a complete list of foods and beverages you should limit. Contact a dietitian for more information. Summary Eating a low-purine diet may help control conditions caused by too much uric acid in the body, such as gout or kidney stones. Choose low-purine foods, limit alcohol, and limit high-fructose corn syrup. You will learn over time which foods do or do not affect you. If you find out that a food tends to cause your gout symptoms to flare up, avoid eating that food. This information is not intended to replace advice given to you by your health care provider. Make sure you discuss any questions you have with your health care provider. Document Revised: 10/16/2021 Document Reviewed: 10/16/2021 Elsevier Patient Education  2024 Elsevier Inc.   Gout  Gout is a condition that causes painful swelling of the joints. Gout is a type of inflammation of the joints (arthritis). This  condition is caused by having too much uric acid in the body. Uric acid is a chemical that forms when the body breaks down substances called purines. Purines are important for building body proteins. When the body has too much uric acid, sharp crystals can form and build up inside the joints. This causes pain and swelling. Gout attacks can happen quickly and may be very painful (acute gout). Over time, the attacks can affect more joints and become more frequent (chronic gout). Gout can also cause uric acid to build up under the skin and inside the kidneys. What are the causes? This condition is caused by too much uric acid in your blood. This can happen because: Your kidneys do not remove enough uric acid from your blood. This is the most common cause. Your body makes too much uric acid. This can happen with some cancers and cancer treatments. It can also occur if your body is breaking down too many red blood cells (hemolytic anemia). You eat too many foods that are high in purines. These foods include organ meats and some seafood. Alcohol, especially beer, is also high in purines. A gout attack may be triggered by trauma or stress. What increases the risk? The following factors may make you more likely to develop this condition: Having a family history of gout. Being male and middle-aged. Being male and having gone through menopause. Taking certain medicines, including aspirin, cyclosporine, diuretics, levodopa, and niacin. Having an organ transplant. Having certain conditions, such as: Being obese. Lead poisoning. Kidney disease. A skin condition called psoriasis. Other factors include: Losing weight too quickly. Being dehydrated. Frequently drinking alcohol, especially beer. Frequently drinking beverages that are sweetened with a type of sugar called fructose. What are the signs or symptoms? An attack of acute gout happens quickly. It usually occurs in just one joint. The most common  place is the big toe. Attacks often start at night. Other joints that may be affected include joints of the feet, ankle, knee, fingers, wrist, or elbow. Symptoms of this condition may include: Severe pain. Warmth. Swelling. Stiffness. Tenderness. The affected joint may be very painful to touch. Shiny, red, or purple skin. Chills and fever. Chronic gout may cause symptoms more frequently. More joints may be involved. You may also have white or yellow lumps (tophi) on your hands or feet or in other areas near your joints. How is this diagnosed? This condition is diagnosed based on your symptoms, your medical history, and a physical exam. You may have tests, such  as: Blood tests to measure uric acid levels. Removal of joint fluid with a thin needle (aspiration) to look for uric acid crystals. X-rays to look for joint damage. How is this treated? Treatment for this condition has two phases: treating an acute attack and preventing future attacks. Acute gout treatment may include medicines to reduce pain and swelling, including: NSAIDs, such as ibuprofen. Steroids. These are strong anti-inflammatory medicines that can be taken by mouth (orally) or injected into a joint. Colchicine. This medicine relieves pain and swelling when it is taken soon after an attack. It can be given by mouth or through an IV. Preventive treatment may include: Daily use of smaller doses of NSAIDs or colchicine. Use of a medicine that reduces uric acid levels in your blood, such as allopurinol. Changes to your diet. You may need to see a dietitian about what to eat and drink to prevent gout. Follow these instructions at home: During a gout attack  If directed, put ice on the affected area. To do this: Put ice in a plastic bag. Place a towel between your skin and the bag. Leave the ice on for 20 minutes, 2-3 times a day. Remove the ice if your skin turns bright red. This is very important. If you cannot feel pain,  heat, or cold, you have a greater risk of damage to the area. Raise (elevate) the affected joint above the level of your heart as often as possible. Rest the joint as much as possible. If the affected joint is in your leg, you may be given crutches to use. Follow instructions from your health care provider about eating or drinking restrictions. Avoiding future gout attacks Follow a low-purine diet as told by your dietitian or health care provider. Avoid foods and drinks that are high in purines, including liver, kidney, anchovies, asparagus, herring, mushrooms, mussels, and beer. Maintain a healthy weight or lose weight if you are overweight. If you want to lose weight, talk with your health care provider. Do not lose weight too quickly. Start or maintain an exercise program as told by your health care provider. Eating and drinking Avoid drinking beverages that contain fructose. Drink enough fluids to keep your urine pale yellow. If you drink alcohol: Limit how much you have to: 0-1 drink a day for women who are not pregnant. 0-2 drinks a day for men. Know how much alcohol is in a drink. In the [place] one drink equals one 12 oz bottle of beer (355 mL), one 5 oz glass of wine (148 mL), or one 1 oz glass of hard liquor (44 mL). General instructions Take over-the-counter and prescription medicines only as told by your health care provider. Ask your health care provider if the medicine prescribed to you requires you to avoid driving or using machinery. Return to your normal activities as told by your health care provider. Ask your health care provider what activities are safe for you. Keep all follow-up visits. This is important. Where to find more information [employer]: [email or web address]/ Contact a health care provider if you have: Another gout attack. Continuing symptoms of a gout attack after 10 days of treatment. Side effects from your medicines. Chills or a  fever. Burning pain when you urinate. Pain in your lower back or abdomen. Get help right away if you: Have severe or uncontrolled pain. Cannot urinate. Summary Gout is painful swelling of the joints caused by having too much uric acid in the body. The most common site for gout  to occur is in the big toe, but it can affect other joints in the body. Medicines and dietary changes can help to prevent and treat gout attacks. This information is not intended to replace advice given to you by your health care provider. Make sure you discuss any questions you have with your health care provider. Document Revised: 08/06/2021 Document Reviewed: 08/06/2021 Elsevier Patient Education  2024 Elsevier Inc.    If you have been instructed to have an in-person evaluation today at a local Urgent Care facility, please use the link below. It will take you to a list of all of our available [facility], including address, phone number and hours of operation. Please do not delay care.  [facility]  If you or a family member do not have a primary care provider, use the link below to schedule a visit and establish care. When you choose a [facility] primary care physician or advanced practice provider, you gain a long-term partner in health. Find a Primary Care Provider  Learn more about [facility]'s in-office and virtual care options: [facility] - Get Care Now

## 2023-06-29 NOTE — Telephone Encounter (Signed)
This pt was seen yesterday with [facility] virtual visit.

## 2023-07-01 ENCOUNTER — Other Ambulatory Visit: Payer: Self-pay

## 2023-07-01 ENCOUNTER — Emergency Department
Admission: EM | Admit: 2023-07-01 | Discharge: 2023-07-01 | Disposition: A | Discharge status: Home / Self Care | Payer: MEDICAID | Attending: Emergency Medicine | Admitting: Emergency Medicine

## 2023-07-01 ENCOUNTER — Emergency Department: Payer: MEDICAID

## 2023-07-01 DIAGNOSIS — J45909 Unspecified asthma, uncomplicated: Secondary | ICD-10-CM | POA: Insufficient documentation

## 2023-07-01 DIAGNOSIS — R4182 Altered mental status, unspecified: Secondary | ICD-10-CM | POA: Diagnosis present

## 2023-07-01 DIAGNOSIS — W19XXXA Unspecified fall, initial encounter: Secondary | ICD-10-CM

## 2023-07-01 DIAGNOSIS — Y908 Blood alcohol level of 240 mg/100 ml or more: Secondary | ICD-10-CM | POA: Insufficient documentation

## 2023-07-01 DIAGNOSIS — F10920 Alcohol use, unspecified with intoxication, uncomplicated: Secondary | ICD-10-CM | POA: Diagnosis not present

## 2023-07-01 DIAGNOSIS — I10 Essential (primary) hypertension: Secondary | ICD-10-CM | POA: Insufficient documentation

## 2023-07-01 DIAGNOSIS — F10929 Alcohol use, unspecified with intoxication, unspecified: Secondary | ICD-10-CM

## 2023-07-01 LAB — CBC WITH DIFFERENTIAL/PLATELET
Abs Immature Granulocytes: 0.03 10*3/uL (ref 0.00–0.07)
Basophils Absolute: 0.1 10*3/uL (ref 0.0–0.1)
Basophils Relative: 1 %
Eosinophils Absolute: 0 10*3/uL (ref 0.0–0.5)
Eosinophils Relative: 1 %
HCT: 39.9 % (ref 39.0–52.0)
Hemoglobin: 12.2 g/dL — ABNORMAL LOW (ref 13.0–17.0)
Immature Granulocytes: 1 %
Lymphocytes Relative: 26 %
Lymphs Abs: 1.5 10*3/uL (ref 0.7–4.0)
MCH: 27.1 pg (ref 26.0–34.0)
MCHC: 30.6 g/dL (ref 30.0–36.0)
MCV: 88.5 fL (ref 80.0–100.0)
Monocytes Absolute: 0.4 10*3/uL (ref 0.1–1.0)
Monocytes Relative: 7 %
Neutro Abs: 3.8 10*3/uL (ref 1.7–7.7)
Neutrophils Relative %: 64 %
Platelets: 238 10*3/uL (ref 150–400)
RBC: 4.51 MIL/uL (ref 4.22–5.81)
RDW: 18.1 % — ABNORMAL HIGH (ref 11.5–15.5)
WBC: 5.8 10*3/uL (ref 4.0–10.5)
nRBC: 0 % (ref 0.0–0.2)

## 2023-07-01 LAB — URINALYSIS, ROUTINE W REFLEX MICROSCOPIC
Bilirubin Urine: NEGATIVE
Glucose, UA: NEGATIVE mg/dL
Hgb urine dipstick: NEGATIVE
Ketones, ur: NEGATIVE mg/dL
Leukocytes,Ua: NEGATIVE
Nitrite: NEGATIVE
Protein, ur: NEGATIVE mg/dL
Specific Gravity, Urine: 1.003 — ABNORMAL LOW (ref 1.005–1.030)
pH: 6 (ref 5.0–8.0)

## 2023-07-01 LAB — COMPREHENSIVE METABOLIC PANEL
ALT: 17 U/L (ref 0–44)
AST: 19 U/L (ref 15–41)
Albumin: 3.7 g/dL (ref 3.5–5.0)
Alkaline Phosphatase: 69 U/L (ref 38–126)
Anion gap: 12 (ref 5–15)
BUN: 9 mg/dL (ref 8–23)
CO2: 23 mmol/L (ref 22–32)
Calcium: 8.7 mg/dL — ABNORMAL LOW (ref 8.9–10.3)
Chloride: 108 mmol/L (ref 98–111)
Creatinine, Ser: 0.91 mg/dL (ref 0.61–1.24)
GFR, Estimated: >60 mL/min (ref >60)
Glucose, Bld: 116 mg/dL — ABNORMAL HIGH (ref 70–99)
Potassium: 4.1 mmol/L (ref 3.5–5.1)
Sodium: 143 mmol/L (ref 135–145)
Total Bilirubin: 0.6 mg/dL (ref 0.3–1.2)
Total Protein: 7 g/dL (ref 6.5–8.1)

## 2023-07-01 LAB — ETHANOL: Alcohol, Ethyl (B): 352 mg/dL (ref <10)

## 2023-07-01 LAB — SALICYLATE LEVEL: Salicylate Lvl: <7 mg/dL — ABNORMAL LOW (ref 7.0–30.0)

## 2023-07-01 LAB — URINE DRUG SCREEN, QUALITATIVE (ARMC ONLY)
Amphetamines, Ur Screen: NOT DETECTED
Barbiturates, Ur Screen: NOT DETECTED
Benzodiazepine, Ur Scrn: POSITIVE — AB
Cannabinoid 50 Ng, Ur ~~LOC~~: POSITIVE — AB
Cocaine Metabolite,Ur ~~LOC~~: NOT DETECTED
MDMA (Ecstasy)Ur Screen: NOT DETECTED
Methadone Scn, Ur: NOT DETECTED
Opiate, Ur Screen: NOT DETECTED
Phencyclidine (PCP) Ur S: NOT DETECTED
Tricyclic, Ur Screen: NOT DETECTED

## 2023-07-01 LAB — MAGNESIUM: Magnesium: 1.9 mg/dL (ref 1.7–2.4)

## 2023-07-01 LAB — AMMONIA: Ammonia: 20 umol/L (ref 9–35)

## 2023-07-01 LAB — LIPASE, BLOOD: Lipase: 32 U/L (ref 11–51)

## 2023-07-01 LAB — ACETAMINOPHEN LEVEL: Acetaminophen (Tylenol), Serum: <10 ug/mL — ABNORMAL LOW (ref 10–30)

## 2023-07-01 MED ORDER — BACITRACIN ZINC 500 UNIT/GM EX OINT
TOPICAL_OINTMENT | Freq: Once | CUTANEOUS | Status: AC
  Administered 2023-07-01: 1 via TOPICAL
  Filled 2023-07-01: qty 0.9

## 2023-07-01 MED ORDER — DEXTROSE 5 % IN LACTATED RINGERS IV BOLUS
2000.0000 mL | Freq: Once | INTRAVENOUS | Status: AC
  Administered 2023-07-01: 2000 mL via INTRAVENOUS
  Filled 2023-07-01: qty 2000

## 2023-07-01 MED ORDER — ACETAMINOPHEN 500 MG PO TABS
1000.0000 mg | ORAL_TABLET | Freq: Once | ORAL | Status: AC
  Administered 2023-07-01: 1000 mg via ORAL
  Filled 2023-07-01: qty 2

## 2023-07-01 NOTE — ED Notes (Signed)
Pt is becoming more coherent about surroundings and where he is but is still not sure of what exactly happened that brought him into the hospital. The pt was able to hold a conversation with me about how he takes care of his wife with MS and is upset that he's in here instead of at home helping his wife. Pt called spouse on personal phone and handed this tech the phone to explain to wife what was going on. Pt verbally expressed consent to update wife on condition. This tech explained to spouse that the pt is currently stable and has a busted lip as she was worried about him bleeding when she called 911 for him at their home. I explained to the spouse that I was sitting with the patient to make sure he didn't pull things off or get out of the bed as he was combative upon arrival. Pt is currently relaxed and awake.

## 2023-07-01 NOTE — ED Notes (Signed)
Pt to ct scan.

## 2023-07-01 NOTE — ED Notes (Addendum)
Pt able to answer orientation questions appropriately, sitter at bedside. Pt agreed to use call light if he needs to get up, and bed alarm on. Sitter discontinued at this time. ERP notified pt requesting something for sleep/pain to upper lip. No new orders at this time.

## 2023-07-01 NOTE — ED Provider Notes (Signed)
[facility] Provider Note    Event Date/Time   First MD Initiated Contact with Patient 07/01/23 [phone]     (approximate)   History   Fall (Unknown down time, AMS) and Altered Mental Status   HPI  Miguel ALAMILLO is a 62 y.o. male who presents to the ED for evaluation of Fall (Unknown down time, AMS) and Altered Mental Status   Patient presents to the ED for evaluation of being found down, drunk and altered.  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  Reportedly found down on the wheelchair ramp in the front of his house and bystander called 911.  He was combative with EMS and got 5 mg of Versed with EMS.  Here in the ED he is unable to provide any relevant history  Later on, as he sobers up, he tells me that he accidentally fell.  No other concerns acutely from his perspective  Physical Exam   Triage Vital Signs: ED Triage Vitals [07/01/23 0429]  Encounter Vitals Group     BP 129/74     Systolic BP Percentile      Diastolic BP Percentile      Pulse Rate 88     Resp 20     Temp      Temp src      SpO2 93 %     Weight      Height      Head Circumference      Peak Flow      Pain Score      Pain Loc      Pain Education      Exclude from Growth Chart     Most recent vital signs: Vitals:   07/01/23 0445 07/01/23 0530  BP:    Pulse:  86  Resp:  17  Temp: (!) 97.3 F (36.3 C)   SpO2:  100%    General: Sleepy.  No distress.  Looks at me when I say his name, but does not follow any commands.  No apparent laterality, shifting in bed with all 4. CV:  Good peripheral perfusion.  Resp:  Normal effort.  Abd:  No distention.  Soft MSK:  No deformity noted.  No clear signs of trauma beyond the face, as listed below. Neuro:  No focal deficits appreciated. Other:  Drying blood to his upper lip without signs of nasal septal hematoma, lip laceration or intraoral injury apparent.   ED Results / Procedures / Treatments   Labs (all labs ordered  are listed, but only abnormal results are displayed) Labs Reviewed  COMPREHENSIVE METABOLIC PANEL - Abnormal; Notable for the following components:      Result Value   Glucose, Bld 116 (*)    Calcium 8.7 (*)    All other components within normal limits  CBC WITH DIFFERENTIAL/PLATELET - Abnormal; Notable for the following components:   Hemoglobin 12.2 (*)    RDW 18.1 (*)    All other components within normal limits  URINALYSIS, ROUTINE W REFLEX MICROSCOPIC - Abnormal; Notable for the following components:   Color, Urine STRAW (*)    APPearance CLEAR (*)    Specific Gravity, Urine 1.003 (*)    All other components within normal limits  ETHANOL - Abnormal; Notable for the following components:   Alcohol, Ethyl (B) 352 (*)    All other components within normal limits  SALICYLATE LEVEL - Abnormal; Notable for the following components:   Salicylate Lvl <7.0 (*)  All other components within normal limits  ACETAMINOPHEN LEVEL - Abnormal; Notable for the following components:   Acetaminophen (Tylenol), Serum <10 (*)    All other components within normal limits  URINE DRUG SCREEN, QUALITATIVE ([facility] ONLY) - Abnormal; Notable for the following components:   Cannabinoid 50 Ng, Ur [place] POSITIVE (*)    Benzodiazepine, Ur Scrn POSITIVE (*)    All other components within normal limits  LIPASE, BLOOD  MAGNESIUM  AMMONIA    EKG Sinus rhythm with a rate of 82 bpm.  Normal axis and intervals.  No clear signs of acute ischemia.  RADIOLOGY CXR interpreted by me without evidence of acute cardiopulmonary pathology. CT head interpreted by me without evidence of acute intracranial pathology CT cervical spine interpreted by me without evidence of fracture or dislocation  Official radiology report(s): DG Chest Portable 1 View  Result Date: 07/01/2023 CLINICAL DATA:  Found down.  Intoxication. EXAM: PORTABLE CHEST 1 VIEW COMPARISON:  05/24/2023 FINDINGS: Artifact from EKG leads. Normal heart size and  mediastinal contours. No acute infiltrate or edema. No effusion or pneumothorax. No acute osseous findings. IMPRESSION: No active disease. Electronically Signed   By: Tiburcio Pea M.D.   On: 07/01/2023 05:33   CT HEAD WO CONTRAST ( )  Result Date: 07/01/2023 CLINICAL DATA:  Ethanol intoxication with fall. EXAM: CT HEAD WITHOUT CONTRAST CT MAXILLOFACIAL WITHOUT CONTRAST CT CERVICAL SPINE WITHOUT CONTRAST TECHNIQUE: Multidetector CT imaging of the head, cervical spine, and maxillofacial structures were performed using the standard protocol without intravenous contrast. Multiplanar CT image reconstructions of the cervical spine and maxillofacial structures were also generated. RADIATION DOSE REDUCTION: This exam was performed according to the departmental dose-optimization program which includes automated exposure control, adjustment of the mA and/or kV according to patient size and/or use of iterative reconstruction technique. COMPARISON:  Head and cervical spine CT 05/05/2023 FINDINGS: CT HEAD FINDINGS Brain: No evidence of acute infarction, hemorrhage, hydrocephalus, extra-axial collection or mass lesion/mass effect. Cerebral volume loss which is generalized. Vascular: No hyperdense vessel or unexpected calcification. Skull: No acute fracture CT MAXILLOFACIAL FINDINGS Osseous: The right mandibular ramus and posterior body shows cortical and medullary thickening and sclerosis which is unchanged from cervical spine CT in 2024 and 2022, suspect sequela of prior trauma although appearance could also be from benign bone lesion or prior dental infection. No acute fracture or mandibular dislocation. Orbits: No evidence of postseptal injury. Sinuses: Mild mucosal thickening in the left maxillary sinus with sclerotic wall thickening attributed to chronic inflammation, stable from 2022. No hemosinus Soft tissues: Lip swelling without opaque foreign body. CT CERVICAL SPINE FINDINGS Alignment: No traumatic malalignment  Skull base and vertebrae: No acute fracture Soft tissues and spinal canal: No prevertebral fluid or swelling. No visible canal hematoma. Disc levels:  Multilevel degenerative endplate spurring Upper chest: Clear apical lungs. IMPRESSION: 1. No evidence of acute intracranial or cervical spine injury. 2. Lip swelling without acute facial fracture. Electronically Signed   By: Tiburcio Pea M.D.   On: 07/01/2023 05:19   CT Cervical Spine Wo Contrast  Result Date: 07/01/2023 CLINICAL DATA:  Ethanol intoxication with fall. EXAM: CT HEAD WITHOUT CONTRAST CT MAXILLOFACIAL WITHOUT CONTRAST CT CERVICAL SPINE WITHOUT CONTRAST TECHNIQUE: Multidetector CT imaging of the head, cervical spine, and maxillofacial structures were performed using the standard protocol without intravenous contrast. Multiplanar CT image reconstructions of the cervical spine and maxillofacial structures were also generated. RADIATION DOSE REDUCTION: This exam was performed according to the departmental dose-optimization program which includes automated exposure  control, adjustment of the mA and/or kV according to patient size and/or use of iterative reconstruction technique. COMPARISON:  Head and cervical spine CT 05/05/2023 FINDINGS: CT HEAD FINDINGS Brain: No evidence of acute infarction, hemorrhage, hydrocephalus, extra-axial collection or mass lesion/mass effect. Cerebral volume loss which is generalized. Vascular: No hyperdense vessel or unexpected calcification. Skull: No acute fracture CT MAXILLOFACIAL FINDINGS Osseous: The right mandibular ramus and posterior body shows cortical and medullary thickening and sclerosis which is unchanged from cervical spine CT in 2024 and 2022, suspect sequela of prior trauma although appearance could also be from benign bone lesion or prior dental infection. No acute fracture or mandibular dislocation. Orbits: No evidence of postseptal injury. Sinuses: Mild mucosal thickening in the left maxillary sinus with  sclerotic wall thickening attributed to chronic inflammation, stable from 2022. No hemosinus Soft tissues: Lip swelling without opaque foreign body. CT CERVICAL SPINE FINDINGS Alignment: No traumatic malalignment Skull base and vertebrae: No acute fracture Soft tissues and spinal canal: No prevertebral fluid or swelling. No visible canal hematoma. Disc levels:  Multilevel degenerative endplate spurring Upper chest: Clear apical lungs. IMPRESSION: 1. No evidence of acute intracranial or cervical spine injury. 2. Lip swelling without acute facial fracture. Electronically Signed   By: Tiburcio Pea M.D.   On: 07/01/2023 05:19   CT Maxillofacial Wo Contrast  Result Date: 07/01/2023 CLINICAL DATA:  Ethanol intoxication with fall. EXAM: CT HEAD WITHOUT CONTRAST CT MAXILLOFACIAL WITHOUT CONTRAST CT CERVICAL SPINE WITHOUT CONTRAST TECHNIQUE: Multidetector CT imaging of the head, cervical spine, and maxillofacial structures were performed using the standard protocol without intravenous contrast. Multiplanar CT image reconstructions of the cervical spine and maxillofacial structures were also generated. RADIATION DOSE REDUCTION: This exam was performed according to the departmental dose-optimization program which includes automated exposure control, adjustment of the mA and/or kV according to patient size and/or use of iterative reconstruction technique. COMPARISON:  Head and cervical spine CT 05/05/2023 FINDINGS: CT HEAD FINDINGS Brain: No evidence of acute infarction, hemorrhage, hydrocephalus, extra-axial collection or mass lesion/mass effect. Cerebral volume loss which is generalized. Vascular: No hyperdense vessel or unexpected calcification. Skull: No acute fracture CT MAXILLOFACIAL FINDINGS Osseous: The right mandibular ramus and posterior body shows cortical and medullary thickening and sclerosis which is unchanged from cervical spine CT in 2024 and 2022, suspect sequela of prior trauma although appearance could  also be from benign bone lesion or prior dental infection. No acute fracture or mandibular dislocation. Orbits: No evidence of postseptal injury. Sinuses: Mild mucosal thickening in the left maxillary sinus with sclerotic wall thickening attributed to chronic inflammation, stable from 2022. No hemosinus Soft tissues: Lip swelling without opaque foreign body. CT CERVICAL SPINE FINDINGS Alignment: No traumatic malalignment Skull base and vertebrae: No acute fracture Soft tissues and spinal canal: No prevertebral fluid or swelling. No visible canal hematoma. Disc levels:  Multilevel degenerative endplate spurring Upper chest: Clear apical lungs. IMPRESSION: 1. No evidence of acute intracranial or cervical spine injury. 2. Lip swelling without acute facial fracture. Electronically Signed   By: Tiburcio Pea M.D.   On: 07/01/2023 05:19    PROCEDURES and INTERVENTIONS:  .1-3 Lead EKG Interpretation  Performed by: Delton Prairie, MD Authorized by: Delton Prairie, MD     Interpretation: normal     ECG rate:  80   ECG rate assessment: normal     Rhythm: sinus rhythm     Ectopy: none     Conduction: normal     Medications  bacitracin ointment (has no  administration in time range)  dextrose 5% lactated ringers bolus 2,000 mL (2,000 mLs Intravenous New Bag/Given 07/01/23 0510)     IMPRESSION / MDM / ASSESSMENT AND PLAN / ED COURSE  I reviewed the triage vital signs and the nursing notes.  Differential diagnosis includes, but is not limited to, seizure, syncope, stroke, ICH, skull fracture, alcoholic ketoacidosis, hepatic encephalopathy  {Patient presents with symptoms of an acute illness or injury that is potentially life-threatening.  Alcoholic patient presents after a fall.  He has a bloody lip but no discrete laceration to require repair.  Otherwise neurologically intact.  Generally reassuring workup with normal ammonia level, lipase, electrolytes and CBC.  Serum ethanol is fairly elevated.   Imaging, as above is benign.  Will sign out to oncoming provider to reassess as patient sobers up for disposition.  Clinical Course as of 07/01/23 0647  Thu Jul 01, 2023  0542 Awakening and improving.  Following commands and providing a voluntary urine sample.  Does report dysuria the past couple days. [DS]  1610 Reassessed. [DS]    Clinical Course User Index [DS] Delton Prairie, MD     FINAL CLINICAL IMPRESSION(S) / ED DIAGNOSES   Final diagnoses:  Alcoholic intoxication with complication (HCC)  Fall, initial encounter     Rx / DC Orders   ED Discharge Orders     None        Note:  This document was prepared using Dragon voice recognition software and may include unintentional dictation errors.   Delton Prairie, MD 07/01/23 [phone]

## 2023-07-01 NOTE — ED Provider Notes (Signed)
Care of this patient assumed from prior physician at 0700 pending metabolization and anticipated discharge. Please see prior physician note for further details.  Briefly this is a 62 year old male presenting after a fall.  Labs and imaging reassuring.  Patient did have significantly elevated alcohol level.  He was allowed to metabolize for several hours.  On reevaluation, he reported some lip pain for which she was ordered for Tylenol.  The patient does now appear clinically sober.  He was discharged in stable condition.   Trinna Post, MD 07/01/23 [phone]

## 2023-07-01 NOTE — Discharge Instructions (Signed)
Return to the ER for new or worsening symptoms. °

## 2023-07-01 NOTE — ED Triage Notes (Signed)
Pt found down on ramp outside home. Unknown loc, ETOH +. Drank 1 pint today.  Pt alert and aggressive with EMS and 5 mg of versed given.   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

## 2023-07-03 ENCOUNTER — Other Ambulatory Visit: Payer: Self-pay

## 2023-07-03 ENCOUNTER — Emergency Department
Admission: EM | Admit: 2023-07-03 | Discharge: 2023-07-04 | Disposition: A | Discharge status: Home / Self Care | Payer: MEDICAID | Attending: Emergency Medicine | Admitting: Emergency Medicine

## 2023-07-03 ENCOUNTER — Emergency Department: Payer: MEDICAID

## 2023-07-03 DIAGNOSIS — F1020 Alcohol dependence, uncomplicated: Secondary | ICD-10-CM | POA: Diagnosis not present

## 2023-07-03 DIAGNOSIS — S0990XA Unspecified injury of head, initial encounter: Secondary | ICD-10-CM | POA: Diagnosis present

## 2023-07-03 DIAGNOSIS — W19XXXA Unspecified fall, initial encounter: Secondary | ICD-10-CM | POA: Insufficient documentation

## 2023-07-03 DIAGNOSIS — I10 Essential (primary) hypertension: Secondary | ICD-10-CM | POA: Diagnosis not present

## 2023-07-03 DIAGNOSIS — F10920 Alcohol use, unspecified with intoxication, uncomplicated: Secondary | ICD-10-CM

## 2023-07-03 DIAGNOSIS — Y908 Blood alcohol level of 240 mg/100 ml or more: Secondary | ICD-10-CM | POA: Diagnosis not present

## 2023-07-03 LAB — COMPREHENSIVE METABOLIC PANEL
ALT: 12 U/L (ref 0–44)
AST: 12 U/L — ABNORMAL LOW (ref 15–41)
Albumin: 3.2 g/dL — ABNORMAL LOW (ref 3.5–5.0)
Alkaline Phosphatase: 66 U/L (ref 38–126)
Anion gap: 12 (ref 5–15)
BUN: 11 mg/dL (ref 8–23)
CO2: 24 mmol/L (ref 22–32)
Calcium: 8.2 mg/dL — ABNORMAL LOW (ref 8.9–10.3)
Chloride: 103 mmol/L (ref 98–111)
Creatinine, Ser: 0.95 mg/dL (ref 0.61–1.24)
GFR, Estimated: >60 mL/min (ref >60)
Glucose, Bld: 166 mg/dL — ABNORMAL HIGH (ref 70–99)
Potassium: 3.3 mmol/L — ABNORMAL LOW (ref 3.5–5.1)
Sodium: 139 mmol/L (ref 135–145)
Total Bilirubin: 0.8 mg/dL (ref 0.3–1.2)
Total Protein: 6.2 g/dL — ABNORMAL LOW (ref 6.5–8.1)

## 2023-07-03 LAB — CBC WITH DIFFERENTIAL/PLATELET
Abs Immature Granulocytes: 0.03 10*3/uL (ref 0.00–0.07)
Basophils Absolute: 0.1 10*3/uL (ref 0.0–0.1)
Basophils Relative: 1 %
Eosinophils Absolute: 0.2 10*3/uL (ref 0.0–0.5)
Eosinophils Relative: 2 %
HCT: 37 % — ABNORMAL LOW (ref 39.0–52.0)
Hemoglobin: 11.4 g/dL — ABNORMAL LOW (ref 13.0–17.0)
Immature Granulocytes: 0 %
Lymphocytes Relative: 18 %
Lymphs Abs: 1.6 10*3/uL (ref 0.7–4.0)
MCH: 27 pg (ref 26.0–34.0)
MCHC: 30.8 g/dL (ref 30.0–36.0)
MCV: 87.5 fL (ref 80.0–100.0)
Monocytes Absolute: 1.1 10*3/uL — ABNORMAL HIGH (ref 0.1–1.0)
Monocytes Relative: 12 %
Neutro Abs: 6 10*3/uL (ref 1.7–7.7)
Neutrophils Relative %: 67 %
Platelets: 233 10*3/uL (ref 150–400)
RBC: 4.23 MIL/uL (ref 4.22–5.81)
RDW: 17.7 % — ABNORMAL HIGH (ref 11.5–15.5)
WBC: 9 10*3/uL (ref 4.0–10.5)
nRBC: 0 % (ref 0.0–0.2)

## 2023-07-03 LAB — ETHANOL: Alcohol, Ethyl (B): 367 mg/dL (ref <10)

## 2023-07-03 MED ORDER — SODIUM CHLORIDE 0.9 % IV BOLUS
1000.0000 mL | Freq: Once | INTRAVENOUS | Status: AC
  Administered 2023-07-03: 1000 mL via INTRAVENOUS

## 2023-07-03 NOTE — ED Notes (Signed)
Patient transported to CT 

## 2023-07-03 NOTE — ED Provider Notes (Signed)
[facility] Provider Note    Event Date/Time   First MD Initiated Contact with Patient 07/03/23 2125     (approximate)  History   Chief Complaint: Fall  HPI  Miguel [name] is a 62 y.o. male with a past medical history of alcohol abuse, gastric reflux, hypertension, presents emergency department after a likely fall.  Patient arrives via EMS, has blood in both nostrils after a presumed fall.  EMS states patient was oriented to person only.  However here the patient is able to answer all my orientation questions person place and time however he is not able to tell me why he is here or what happened to him.  Is not able to tell me if he was drinking tonight.  Is not able to provide any additional history.  Physical Exam   Triage Vital Signs: ED Triage Vitals [07/03/23 2121]  Encounter Vitals Group     BP (!) 142/88     Systolic BP Percentile      Diastolic BP Percentile      Pulse Rate 86     Resp 10     Temp (!) 97.5 F (36.4 C)     Temp Source Oral     SpO2 100 %     Weight      Height      Head Circumference      Peak Flow      Pain Score      Pain Loc      Pain Education      Exclude from Growth Chart     Most recent vital signs: Vitals:   07/03/23 2121  BP: (!) 142/88  Pulse: 86  Resp: 10  Temp: (!) 97.5 F (36.4 C)  SpO2: 100%    General: Awake, no distress.  Blood in bilateral nostrils.  No septal hematoma.  Blood on face.  No obvious scalp hematoma or laceration noted CV:  Good peripheral perfusion.  Regular rate and rhythm  Resp:  Normal effort.  Equal breath sounds bilaterally.  Abd:  No distention.  Soft, nontender.  No rebound or guarding.  ED Results / Procedures / Treatments   EKG  EKG viewed and interpreted by myself shows sinus tachycardia 112 bpm with a narrow QRS, normal axis, normal intervals, nonspecific ST changes.  No ST elevation.  RADIOLOGY  I reviewed and interpreted CT head images.  No bleed seen on my  evaluation. Radiology read the CT scan is negative of the head, negative of the face and negative of the C-spine.  Air-fluid level in the left sphenoid sinus.  This could be related to the bleeding as well.   MEDICATIONS ORDERED IN ED: Medications  sodium chloride 0.9 % bolus 1,000 mL (has no administration in time range)     IMPRESSION / MDM / ASSESSMENT AND PLAN / ED COURSE  I reviewed the triage vital signs and the nursing notes.  Patient's presentation is most consistent with acute presentation with potential threat to life or bodily function.  Patient presents to the emergency department for a fall/head injury.  Has blood in bilateral nostrils and on his face.  Patient does appear intoxicated.  Differential would include alcohol intoxication leading to a fall, alcohol withdrawal leading to a seizure, metabolic or electrolyte abnormality, intracranial injury.  Will obtain CT imaging of the head face and neck as a precaution we will check labs including an ethanol level.  We will IV hydrate and continue to  closely monitor.  Patient CT scans are negative for acute or traumatic abnormality.  And patient's CBC shows no concerning findings chemistry shows reassuring electrolytes and renal function.  Patient's ethanol level is 367 which likely explains the patient's current demeanor.  Patient receiving IV fluids.  Will allow the patient to sober in the emergency department prior to discharge.  Given the patient's otherwise reassuring workup anticipate discharge once more sober.  FINAL CLINICAL IMPRESSION(S) / ED DIAGNOSES   Alcohol use disorder Head injury   Note:  This document was prepared using Dragon voice recognition software and may include unintentional dictation errors.   Minna Antis, MD 07/03/23 2238

## 2023-07-03 NOTE — Discharge Instructions (Addendum)
Please abstain from drinking alcohol.  Please follow-up with your doctor within the next several days for recheck/reevaluation.

## 2023-07-03 NOTE — ED Notes (Signed)
Patient is speaking clearly at this time, and is alert and oriented.  Patient has asked to leave.  Per Dr. Lenard Lance, if a sober ride can come and get him, he can leave here around midnight.

## 2023-07-03 NOTE — ED Triage Notes (Signed)
Patient has been drinking today, got up to answer the door and tripped over his wife's prosthetic leg.  Patient fell onto his face with small laceration to his nose.  Patient does not recall fall.  Wife told EMS that he tripped and fell, but never suffered LOC.  Patient is A&Ox1 at triage.  Patient is not on blood thinners.

## 2023-07-03 NOTE — ED Notes (Signed)
Fall risk bracelet placed on patient, and bed alarm has been activated.

## 2023-07-04 MED ORDER — KETOROLAC TROMETHAMINE 15 MG/ML IJ SOLN
15.0000 mg | Freq: Once | INTRAMUSCULAR | Status: AC
  Administered 2023-07-04: 15 mg via INTRAVENOUS
  Filled 2023-07-04: qty 1

## 2023-07-04 MED ORDER — ONDANSETRON HCL 4 MG/2ML IJ SOLN
4.0000 mg | Freq: Once | INTRAMUSCULAR | Status: AC
  Administered 2023-07-04: 4 mg via INTRAVENOUS
  Filled 2023-07-04: qty 2

## 2023-07-04 MED ORDER — ACETAMINOPHEN 500 MG PO TABS
1000.0000 mg | ORAL_TABLET | Freq: Once | ORAL | Status: AC
  Administered 2023-07-04: 1000 mg via ORAL
  Filled 2023-07-04: qty 2

## 2023-07-04 NOTE — ED Provider Notes (Signed)
Evaluated at this time awake alert comfortable appearing.  Ongoing facial pain, medicated.  Appears to be clinically sober for discharge.   Pilar Jarvis, MD 07/04/23 [phone]

## 2023-07-05 ENCOUNTER — Other Ambulatory Visit: Payer: Self-pay

## 2023-07-12 ENCOUNTER — Emergency Department
Admission: EM | Admit: 2023-07-12 | Discharge: 2023-07-13 | Disposition: A | Discharge status: Home / Self Care | Payer: MEDICAID | Attending: Emergency Medicine | Admitting: Emergency Medicine

## 2023-07-12 ENCOUNTER — Other Ambulatory Visit: Payer: Self-pay

## 2023-07-12 ENCOUNTER — Emergency Department: Payer: MEDICAID

## 2023-07-12 ENCOUNTER — Encounter: Payer: Self-pay | Admitting: *Deleted

## 2023-07-12 DIAGNOSIS — E876 Hypokalemia: Secondary | ICD-10-CM | POA: Diagnosis not present

## 2023-07-12 DIAGNOSIS — M109 Gout, unspecified: Secondary | ICD-10-CM | POA: Insufficient documentation

## 2023-07-12 DIAGNOSIS — R7402 Elevation of levels of lactic acid dehydrogenase (LDH): Secondary | ICD-10-CM | POA: Diagnosis not present

## 2023-07-12 DIAGNOSIS — L02212 Cutaneous abscess of back [any part, except buttock]: Secondary | ICD-10-CM | POA: Insufficient documentation

## 2023-07-12 DIAGNOSIS — R000 Tachycardia, unspecified: Secondary | ICD-10-CM | POA: Diagnosis not present

## 2023-07-12 DIAGNOSIS — L0291 Cutaneous abscess, unspecified: Secondary | ICD-10-CM

## 2023-07-12 LAB — CBC
HCT: 42.4 % (ref 39.0–52.0)
Hemoglobin: 12.9 g/dL — ABNORMAL LOW (ref 13.0–17.0)
MCH: 25.6 pg — ABNORMAL LOW (ref 26.0–34.0)
MCHC: 30.4 g/dL (ref 30.0–36.0)
MCV: 84.1 fL (ref 80.0–100.0)
Platelets: 363 10*3/uL (ref 150–400)
RBC: 5.04 MIL/uL (ref 4.22–5.81)
RDW: 17.1 % — ABNORMAL HIGH (ref 11.5–15.5)
WBC: 13 10*3/uL — ABNORMAL HIGH (ref 4.0–10.5)
nRBC: 0 % (ref 0.0–0.2)

## 2023-07-12 LAB — URINALYSIS, ROUTINE W REFLEX MICROSCOPIC
Bilirubin Urine: NEGATIVE
Glucose, UA: NEGATIVE mg/dL
Hgb urine dipstick: NEGATIVE
Ketones, ur: 20 mg/dL — AB
Nitrite: NEGATIVE
Protein, ur: NEGATIVE mg/dL
Specific Gravity, Urine: 1.013 (ref 1.005–1.030)
pH: 6 (ref 5.0–8.0)

## 2023-07-12 LAB — TROPONIN I (HIGH SENSITIVITY)
Troponin I (High Sensitivity): 5 ng/L (ref <18)
Troponin I (High Sensitivity): 3 ng/L (ref <18)

## 2023-07-12 LAB — BASIC METABOLIC PANEL
Anion gap: 14 (ref 5–15)
BUN: 11 mg/dL (ref 8–23)
CO2: 21 mmol/L — ABNORMAL LOW (ref 22–32)
Calcium: 8.6 mg/dL — ABNORMAL LOW (ref 8.9–10.3)
Chloride: 99 mmol/L (ref 98–111)
Creatinine, Ser: 1.13 mg/dL (ref 0.61–1.24)
GFR, Estimated: >60 mL/min (ref >60)
Glucose, Bld: 137 mg/dL — ABNORMAL HIGH (ref 70–99)
Potassium: 3.1 mmol/L — ABNORMAL LOW (ref 3.5–5.1)
Sodium: 134 mmol/L — ABNORMAL LOW (ref 135–145)

## 2023-07-12 LAB — LACTIC ACID, PLASMA
Lactic Acid, Venous: 3.2 mmol/L (ref 0.5–1.9)
Lactic Acid, Venous: 2.2 mmol/L (ref 0.5–1.9)

## 2023-07-12 MED ORDER — OXYCODONE-ACETAMINOPHEN 5-325 MG PO TABS
1.0000 | ORAL_TABLET | ORAL | 0 refills | Status: AC | PRN
Start: 2023-07-12 — End: 2023-07-15

## 2023-07-12 MED ORDER — DOXYCYCLINE HYCLATE 100 MG PO TABS
100.0000 mg | ORAL_TABLET | Freq: Once | ORAL | Status: AC
  Administered 2023-07-12: 100 mg via ORAL
  Filled 2023-07-12: qty 1

## 2023-07-12 MED ORDER — KETOROLAC TROMETHAMINE 15 MG/ML IJ SOLN
15.0000 mg | Freq: Once | INTRAMUSCULAR | Status: AC
  Administered 2023-07-12: 15 mg via INTRAVENOUS
  Filled 2023-07-12: qty 1

## 2023-07-12 MED ORDER — SODIUM CHLORIDE 0.9 % IV BOLUS
1000.0000 mL | Freq: Once | INTRAVENOUS | Status: AC
  Administered 2023-07-12: 1000 mL via INTRAVENOUS

## 2023-07-12 MED ORDER — FENTANYL CITRATE PF 50 MCG/ML IJ SOSY
50.0000 ug | PREFILLED_SYRINGE | Freq: Once | INTRAMUSCULAR | Status: AC
  Administered 2023-07-12: 50 ug via INTRAVENOUS
  Filled 2023-07-12: qty 1

## 2023-07-12 MED ORDER — CEPHALEXIN 500 MG PO CAPS
500.0000 mg | ORAL_CAPSULE | Freq: Once | ORAL | Status: AC
  Administered 2023-07-12: 500 mg via ORAL
  Filled 2023-07-12: qty 1

## 2023-07-12 MED ORDER — DOXYCYCLINE MONOHYDRATE 100 MG PO TABS: 100.0000 mg | ORAL_TABLET | Freq: Two times a day (BID) | ORAL | 0 refills | Status: AC

## 2023-07-12 MED ORDER — CEPHALEXIN 500 MG PO CAPS: 500.0000 mg | ORAL_CAPSULE | Freq: Two times a day (BID) | ORAL | 0 refills | Status: AC

## 2023-07-12 MED ORDER — LIDOCAINE HCL (PF) 1 % IJ SOLN
10.0000 mL | Freq: Once | INTRAMUSCULAR | Status: AC
  Administered 2023-07-12: 10 mL via INTRADERMAL
  Filled 2023-07-12: qty 10

## 2023-07-12 NOTE — ED Provider Notes (Signed)
[facility] Provider Note    Event Date/Time   First MD Initiated Contact with Patient 07/12/23 2054     (approximate)   History   Wrist Pain and Abscess   HPI  Miguel [name] is a 62 y.o. male past medical history significant for gout, alcohol use disorder, presents to the emergency department with multiple complaints.  Patient states that he is having pain to his back from a large abscess and that he is recently taken some antibiotics but that did not improve.  Complaining of significant pain.  Denies any fever or chills.  States that he is unable to sleep secondary to severe pain.  Also complaining of pain to his left wrist.  States that he was recently diagnosed with gout in his left wrist and is on colchicine without significant improvement.  Denies any falls or trauma.     Physical Exam   Triage Vital Signs: ED Triage Vitals  Encounter Vitals Group     BP 07/12/23 2016 90/70     Systolic BP Percentile --      Diastolic BP Percentile --      Pulse Rate 07/12/23 2016 (!) 126     Resp 07/12/23 2016 20     Temp 07/12/23 2016 98.6 F (37 C)     Temp Source 07/12/23 2016 Oral     SpO2 07/12/23 2016 100 %     Weight 07/12/23 2013 185 lb 3 oz (84 kg)     Height 07/12/23 2013 6' (1.829 m)     Head Circumference --      Peak Flow --      Pain Score 07/12/23 2013 5     Pain Loc --      Pain Education --      Exclude from Growth Chart --     Most recent vital signs: Vitals:   07/12/23 2200 07/12/23 2348  BP: 131/85 (!) 150/91  Pulse: 85 86  Resp: 12 16  Temp:    SpO2: 100% 100%    Physical Exam Constitutional:      General: He is in acute distress.     Appearance: He is well-developed.  HENT:     Head: Atraumatic.  Eyes:     Conjunctiva/sclera: Conjunctivae normal.     Pupils: Pupils are equal, round, and reactive to light.  Cardiovascular:     Rate and Rhythm: Regular rhythm. Tachycardia present.  Pulmonary:     Effort: No respiratory  distress.  Abdominal:     Tenderness: There is no abdominal tenderness.  Musculoskeletal:     Cervical back: Normal range of motion.     Comments: Left wrist with tenderness to palpation.  No overlying swelling, erythema warmth or induration.  Able to range but limited secondary to pain.  +2 radial pulses.  No redness or streaking up the arm.  Large fluctuant area to the mid thoracic back on the right side.  No surrounding erythema warmth or induration.  Skin:    General: Skin is warm.     Capillary Refill: Capillary refill takes less than 2 seconds.  Neurological:     Mental Status: He is alert. Mental status is at baseline.     IMPRESSION / MDM / ASSESSMENT AND PLAN / ED COURSE  I reviewed the triage vital signs and the nursing notes.  On arrival afebrile tachycardic with a soft blood pressure.  Lab work ordered in triage including a lactic acid.  Differential diagnosis  including gout, abscess, infected lipoma, cellulitis.   No tachycardic or bradycardic dysrhythmias while on cardiac telemetry.  RADIOLOGY my interpretation of imaging: X-ray of the wrist with no acute fracture or dislocation read as no acute findings  LABS (all labs ordered are listed, but only abnormal results are displayed) Labs interpreted as -    Labs Reviewed  BASIC METABOLIC PANEL - Abnormal; Notable for the following components:      Result Value   Sodium 134 (*)    Potassium 3.1 (*)    CO2 21 (*)    Glucose, Bld 137 (*)    Calcium 8.6 (*)    All other components within normal limits  CBC - Abnormal; Notable for the following components:   WBC 13.0 (*)    Hemoglobin 12.9 (*)    MCH 25.6 (*)    RDW 17.1 (*)    All other components within normal limits  LACTIC ACID, PLASMA - Abnormal; Notable for the following components:   Lactic Acid, Venous 3.2 (*)    All other components within normal limits  LACTIC ACID, PLASMA - Abnormal; Notable for the following components:   Lactic Acid, Venous 2.2  (*)    All other components within normal limits  URINALYSIS, ROUTINE W REFLEX MICROSCOPIC - Abnormal; Notable for the following components:   Color, Urine YELLOW (*)    APPearance CLEAR (*)    Ketones, ur 20 (*)    Leukocytes,Ua TRACE (*)    Bacteria, UA RARE (*)    All other components within normal limits  TROPONIN I (HIGH SENSITIVITY)  TROPONIN I (HIGH SENSITIVITY)     MDM    Patient with mild leukocytosis of 13.  Anemia but hemoglobin is stable.  Normal platelets.  Creatinine is at baseline.  Mild hypokalemia.  No other significant electrolyte abnormalities.  Initially lactic acid was elevated and repeat lactic acid after 1 L of IV fluid significantly improved to 2.2.    On repeat vital signs patient no longer tachycardic with normal blood pressure.  Incision and drainage to the abscess, concern for an infected lipoma.  Packing was placed.  Given first dose of Keflex and doxycycline in the emergency department and given a prescription for Keflex and doxycycline.  Discussed that he needs to have his wound reevaluated and his packing removed in 5 days.  His wrist concerning for gout infection.  Patient is already on colchicine.  No signs of septic joint.  No tenderness to the elbow or red streaking and no signs of an overlying cellulitis.  Given that he is already been on colchicine and taking NSAIDs we will do a short course of opioids.  Discussed close follow-up with primary care physician.  Given return precautions for any worsening symptoms.   PROCEDURES:  Critical Care performed: No  ..Incision and Drainage  Date/Time: 07/13/2023 1:21 AM  Performed by: Corena Herter, MD Authorized by: Corena Herter, MD   Consent:    Consent obtained:  Verbal   Consent given by:  Patient   Risks, benefits, and alternatives were discussed: yes     Risks discussed:  Bleeding, incomplete drainage, pain and infection   Alternatives discussed:  No treatment and delayed treatment Universal  protocol:    Procedure explained and questions answered to patient or proxy's satisfaction: yes     Relevant documents present and verified: yes     Test results available : yes     Required blood products, implants, devices, and special equipment available: yes  Patient identity confirmed:  Verbally with patient, arm band and provided demographic data Location:    Type:  Abscess   Size:  5 x 4 cm   Location:  Trunk   Trunk location:  Back Pre-procedure details:    Skin preparation:  Povidone-iodine Anesthesia:    Anesthesia method:  Local infiltration   Local anesthetic:  Lidocaine 1% w/o epi Procedure type:    Complexity:  Complex Procedure details:    Incision types:  Stab incision   Wound management:  Probed and deloculated   Drainage:  Purulent   Drainage amount:  Copious   Wound treatment:  Drain placed   Packing materials:  1/4 in iodoform gauze   Amount 1/4" iodoform:  7 cm Post-procedure details:    Procedure completion:  Tolerated well, no immediate complications   Patient's presentation is most consistent with acute presentation with potential threat to life or bodily function.   MEDICATIONS ORDERED IN ED: Medications  sodium chloride 0.9 % bolus 1,000 mL (0 mLs Intravenous Stopped 07/12/23 2347)  ketorolac (TORADOL) 15 MG/ML injection 15 mg (15 mg Intravenous Given 07/12/23 2210)  lidocaine (PF) (XYLOCAINE) 1 % injection 10 mL (10 mLs Intradermal Given 07/12/23 2210)  fentaNYL (SUBLIMAZE) injection 50 mcg (50 mcg Intravenous Given 07/12/23 2258)  cephALEXin (KEFLEX) capsule 500 mg (500 mg Oral Given 07/12/23 2347)  doxycycline (VIBRA-TABS) tablet 100 mg (100 mg Oral Given 07/12/23 2347)    FINAL CLINICAL IMPRESSION(S) / ED DIAGNOSES   Final diagnoses:  Abscess  Acute gout of left wrist, unspecified cause     Rx / DC Orders   ED Discharge Orders          Ordered    doxycycline (ADOXA) 100 MG tablet  2 times daily        07/12/23 2342    cephALEXin  (KEFLEX) 500 MG capsule  2 times daily        07/12/23 2342    oxyCODONE-acetaminophen (PERCOCET) 5-325 MG tablet  Every 4 hours PRN        07/12/23 2344             Note:  This document was prepared using Dragon voice recognition software and may include unintentional dictation errors.   Corena Herter, MD 07/13/23 [phone]

## 2023-07-12 NOTE — ED Triage Notes (Addendum)
Pt brought in via ems from home. Pt has right wrist pain.   Pt has gout, taking indocin without relief.  Pt also has an abscess to right mid back area.  Area red and tender to touch.  Pt alert.  Pt has nausea and dry heaves in triage.  Last etoh was 8 days ago per pt.

## 2023-07-12 NOTE — Discharge Instructions (Addendum)
You are seen in the emergency department for an abscess on your back.  It was drained in the emergency department.  You had packing placed, you need to follow-up in 5 days to have the abscess rechecked and the packing removed.  You were started on antibiotics of Keflex and doxycycline.  Concern your wrist pain is from gout.  Your x-ray was normal.  Call your primary care physician tomorrow to schedule close follow-up appointment.    You were given a prescription for narcotic pain medications.  Take only if in severe pain.  These are very addictive medications.  These medications can make you constipated.  If you need to take more than 1-2 doses, start a stool softner.  If you become constipated, take 1 capfull of MiraLAX, can repeat untill having regular bowel movements.  Keep this medication out of reach of any children.  Pain control:  Ibuprofen (motrin/aleve/advil) - You can take 3 tablets (600 mg) every 6 hours as needed for pain/fever.  Acetaminophen (tylenol) - You can take 2 extra strength tablets (1000 mg) every 6 hours as needed for pain/fever.  You can alternate these medications or take them together.  Make sure you eat food/drink water when taking these medications.

## 2023-07-12 NOTE — ED Notes (Signed)
Area on back cleaned and dressing placed.

## 2023-08-07 ENCOUNTER — Emergency Department
Admission: EM | Admit: 2023-08-07 | Discharge: 2023-08-08 | Disposition: A | Discharge status: Home / Self Care | Payer: MEDICAID | Attending: Emergency Medicine | Admitting: Emergency Medicine

## 2023-08-07 ENCOUNTER — Emergency Department: Payer: MEDICAID

## 2023-08-07 DIAGNOSIS — S301XXA Contusion of abdominal wall, initial encounter: Secondary | ICD-10-CM | POA: Insufficient documentation

## 2023-08-07 DIAGNOSIS — Z789 Other specified health status: Secondary | ICD-10-CM

## 2023-08-07 DIAGNOSIS — F1090 Alcohol use, unspecified, uncomplicated: Secondary | ICD-10-CM | POA: Diagnosis not present

## 2023-08-07 DIAGNOSIS — S199XXA Unspecified injury of neck, initial encounter: Secondary | ICD-10-CM | POA: Diagnosis present

## 2023-08-07 DIAGNOSIS — S12500A Unspecified displaced fracture of sixth cervical vertebra, initial encounter for closed fracture: Secondary | ICD-10-CM | POA: Insufficient documentation

## 2023-08-07 DIAGNOSIS — W1839XA Other fall on same level, initial encounter: Secondary | ICD-10-CM | POA: Insufficient documentation

## 2023-08-07 DIAGNOSIS — W19XXXA Unspecified fall, initial encounter: Secondary | ICD-10-CM

## 2023-08-07 DIAGNOSIS — S12501A Unspecified nondisplaced fracture of sixth cervical vertebra, initial encounter for closed fracture: Secondary | ICD-10-CM

## 2023-08-07 DIAGNOSIS — R0781 Pleurodynia: Secondary | ICD-10-CM | POA: Insufficient documentation

## 2023-08-07 DIAGNOSIS — Y908 Blood alcohol level of 240 mg/100 ml or more: Secondary | ICD-10-CM | POA: Diagnosis not present

## 2023-08-07 LAB — CBC WITH DIFFERENTIAL/PLATELET
Abs Immature Granulocytes: 0.02 10*3/uL (ref 0.00–0.07)
Basophils Absolute: 0.1 10*3/uL (ref 0.0–0.1)
Basophils Relative: 1 %
Eosinophils Absolute: 0.2 10*3/uL (ref 0.0–0.5)
Eosinophils Relative: 3 %
HCT: 35.8 % — ABNORMAL LOW (ref 39.0–52.0)
Hemoglobin: 11.1 g/dL — ABNORMAL LOW (ref 13.0–17.0)
Immature Granulocytes: 0 %
Lymphocytes Relative: 29 %
Lymphs Abs: 2.1 10*3/uL (ref 0.7–4.0)
MCH: 25.2 pg — ABNORMAL LOW (ref 26.0–34.0)
MCHC: 31 g/dL (ref 30.0–36.0)
MCV: 81.2 fL (ref 80.0–100.0)
Monocytes Absolute: 0.7 10*3/uL (ref 0.1–1.0)
Monocytes Relative: 9 %
Neutro Abs: 4.2 10*3/uL (ref 1.7–7.7)
Neutrophils Relative %: 58 %
Platelets: 223 10*3/uL (ref 150–400)
RBC: 4.41 MIL/uL (ref 4.22–5.81)
RDW: 18.6 % — ABNORMAL HIGH (ref 11.5–15.5)
WBC: 7.3 10*3/uL (ref 4.0–10.5)
nRBC: 0 % (ref 0.0–0.2)

## 2023-08-07 LAB — URINALYSIS, ROUTINE W REFLEX MICROSCOPIC
Bacteria, UA: NONE SEEN
Bilirubin Urine: NEGATIVE
Glucose, UA: NEGATIVE mg/dL
Ketones, ur: NEGATIVE mg/dL
Leukocytes,Ua: NEGATIVE
Nitrite: NEGATIVE
Protein, ur: NEGATIVE mg/dL
Specific Gravity, Urine: 1.001 — ABNORMAL LOW (ref 1.005–1.030)
Squamous Epithelial / HPF: 0 /HPF (ref 0–5)
pH: 7 (ref 5.0–8.0)

## 2023-08-07 LAB — URINE DRUG SCREEN, QUALITATIVE (ARMC ONLY)
Amphetamines, Ur Screen: NOT DETECTED
Barbiturates, Ur Screen: NOT DETECTED
Benzodiazepine, Ur Scrn: NOT DETECTED
Cannabinoid 50 Ng, Ur ~~LOC~~: POSITIVE — AB
Cocaine Metabolite,Ur ~~LOC~~: NOT DETECTED
MDMA (Ecstasy)Ur Screen: NOT DETECTED
Methadone Scn, Ur: NOT DETECTED
Opiate, Ur Screen: NOT DETECTED
Phencyclidine (PCP) Ur S: NOT DETECTED
Tricyclic, Ur Screen: NOT DETECTED

## 2023-08-07 LAB — COMPREHENSIVE METABOLIC PANEL
ALT: 16 U/L (ref 0–44)
AST: 19 U/L (ref 15–41)
Albumin: 3.5 g/dL (ref 3.5–5.0)
Alkaline Phosphatase: 61 U/L (ref 38–126)
Anion gap: 15 (ref 5–15)
BUN: 10 mg/dL (ref 8–23)
CO2: 22 mmol/L (ref 22–32)
Calcium: 8.4 mg/dL — ABNORMAL LOW (ref 8.9–10.3)
Chloride: 106 mmol/L (ref 98–111)
Creatinine, Ser: 0.71 mg/dL (ref 0.61–1.24)
GFR, Estimated: >60 mL/min (ref >60)
Glucose, Bld: 84 mg/dL (ref 70–99)
Potassium: 3.6 mmol/L (ref 3.5–5.1)
Sodium: 143 mmol/L (ref 135–145)
Total Bilirubin: 0.6 mg/dL (ref 0.3–1.2)
Total Protein: 6.7 g/dL (ref 6.5–8.1)

## 2023-08-07 LAB — TROPONIN I (HIGH SENSITIVITY): Troponin I (High Sensitivity): <2 ng/L (ref <18)

## 2023-08-07 LAB — ETHANOL: Alcohol, Ethyl (B): 351 mg/dL (ref <10)

## 2023-08-07 LAB — LACTIC ACID, PLASMA: Lactic Acid, Venous: 4.2 mmol/L (ref 0.5–1.9)

## 2023-08-07 LAB — LIPASE, BLOOD: Lipase: 49 U/L (ref 11–51)

## 2023-08-07 MED ORDER — LACTATED RINGERS IV BOLUS
1000.0000 mL | Freq: Once | INTRAVENOUS | Status: AC
  Administered 2023-08-07: 1000 mL via INTRAVENOUS

## 2023-08-07 MED ORDER — IOHEXOL 300 MG/ML  SOLN
100.0000 mL | Freq: Once | INTRAMUSCULAR | Status: AC | PRN
  Administered 2023-08-07: 100 mL via INTRAVENOUS

## 2023-08-07 MED ORDER — MORPHINE SULFATE (PF) 4 MG/ML IV SOLN
4.0000 mg | Freq: Once | INTRAVENOUS | Status: AC
  Administered 2023-08-07: 4 mg via INTRAVENOUS
  Filled 2023-08-07: qty 1

## 2023-08-07 MED ORDER — FENTANYL CITRATE PF 50 MCG/ML IJ SOSY
50.0000 ug | PREFILLED_SYRINGE | Freq: Once | INTRAMUSCULAR | Status: AC
  Administered 2023-08-07: 50 ug via INTRAVENOUS
  Filled 2023-08-07: qty 1

## 2023-08-07 NOTE — ED Notes (Signed)
MD notified of critical lactic of 4.2 and EtoH of 351.

## 2023-08-07 NOTE — ED Provider Notes (Signed)
[facility] Provider Note    Event Date/Time   First MD Initiated Contact with Patient 08/07/23 2055     (approximate)   History   Shortness of Breath   HPI Miguel [name] is a 62 y.o. male presenting today for fall.  Patient reports having a ground-level fall 2 days ago.  He did hit his head and unknown loss of consciousness.  He has had bilateral rib pain as well as abdominal pain throughout.  He has noted new bruising on the sides of his abdomen bilaterally.  He has had a difficult time breathing over the past day due to pain with inspiration.  Not currently on any blood thinners.  Denies any trauma or injury to his extremities.     Physical Exam   Triage Vital Signs: ED Triage Vitals  Encounter Vitals Group     BP 08/07/23 2055 (!) 154/88     Systolic BP Percentile --      Diastolic BP Percentile --      Pulse --      Resp 08/07/23 2055 16     Temp 08/07/23 2055 98 F (36.7 C)     Temp Source 08/07/23 2055 Oral     SpO2 08/07/23 2055 100 %     Weight 08/07/23 2059 185 lb (83.9 kg)     Height 08/07/23 2059 6' (1.829 m)     Head Circumference --      Peak Flow --      Pain Score 08/07/23 2059 8     Pain Loc --      Pain Education --      Exclude from Growth Chart --     Most recent vital signs: Vitals:   08/08/23 0400 08/08/23 0437  BP: (!) 155/94 (!) 162/92  Pulse: 90 92  Resp: 14 16  Temp:    SpO2: 92% 96%   Physical Exam: I have reviewed the vital signs and nursing notes. General: Awake, alert, no acute distress.  Nontoxic appearing. Head:  Atraumatic, normocephalic.   ENT:  EOM intact, PERRL. Oral mucosa is pink and moist with no lesions. Neck: Neck is supple with full range of motion, No meningeal signs. Cardiovascular:  RRR, No murmurs. Peripheral pulses palpable and equal bilaterally. Chest wall: Tenderness palpation throughout bilateral chest wall Respiratory:  Symmetrical chest wall expansion.  No rhonchi, rales, or wheezes.   Good air movement throughout.  No use of accessory muscles.   Musculoskeletal:  No cyanosis or edema. Moving extremities with full ROM.  No tenderness palpation throughout bilateral upper and lower extremities.  No tenderness to cervical spine or lumbar spine.  Tenderness palpation to thoracic spine. Abdomen:  Soft, tenderness palpation noted throughout abdomen with bruising present in the bilateral flanks, nondistended. Neuro:  GCS 15, moving all four extremities, interacting appropriately. Speech clear. Psych:  Calm, appropriate.   Skin:  Warm, dry, no rash.     ED Results / Procedures / Treatments   Labs (all labs ordered are listed, but only abnormal results are displayed) Labs Reviewed  CBC WITH DIFFERENTIAL/PLATELET - Abnormal; Notable for the following components:      Result Value   Hemoglobin 11.1 (*)    HCT 35.8 (*)    MCH 25.2 (*)    RDW 18.6 (*)    All other components within normal limits  COMPREHENSIVE METABOLIC PANEL - Abnormal; Notable for the following components:   Calcium 8.4 (*)    All other components  within normal limits  URINALYSIS, ROUTINE W REFLEX MICROSCOPIC - Abnormal; Notable for the following components:   Color, Urine COLORLESS (*)    APPearance CLEAR (*)    Specific Gravity, Urine 1.001 (*)    Hgb urine dipstick SMALL (*)    All other components within normal limits  URINE DRUG SCREEN, QUALITATIVE ([facility] ONLY) - Abnormal; Notable for the following components:   Cannabinoid 50 Ng, Ur [place] POSITIVE (*)    All other components within normal limits  LACTIC ACID, PLASMA - Abnormal; Notable for the following components:   Lactic Acid, Venous 4.2 (*)    All other components within normal limits  LACTIC ACID, PLASMA - Abnormal; Notable for the following components:   Lactic Acid, Venous 4.9 (*)    All other components within normal limits  ETHANOL - Abnormal; Notable for the following components:   Alcohol, Ethyl (B) 351 (*)    All other components within  normal limits  LACTIC ACID, PLASMA - Abnormal; Notable for the following components:   Lactic Acid, Venous 4.5 (*)    All other components within normal limits  LIPASE, BLOOD  TROPONIN I (HIGH SENSITIVITY)     EKG    RADIOLOGY Independently interpreted CT chest/abdomen/pelvis with no acute pathology.  CT head with no acute pathology.  CT C-spine showing slight displaced fracture of the anterior/inferior portion of C6.   PROCEDURES:  Critical Care performed: No  Procedures   MEDICATIONS ORDERED IN ED: Medications  fentaNYL (SUBLIMAZE) injection 50 mcg (50 mcg Intravenous Given 08/07/23 2114)  lactated ringers bolus 1,000 mL (0 mLs Intravenous Stopped 08/07/23 2257)  iohexol (OMNIPAQUE) 300 MG/ML solution 100 mL (100 mLs Intravenous Contrast Given 08/07/23 2150)  lactated ringers bolus 1,000 mL (0 mLs Intravenous Stopped 08/08/23 0206)  morphine (PF) 4 MG/ML injection 4 mg (4 mg Intravenous Given 08/07/23 2355)  lactated ringers bolus 2,000 mL (0 mLs Intravenous Stopped 08/08/23 0245)  HYDROmorphone (DILAUDID) injection 0.5 mg (0.5 mg Intravenous Given 08/08/23 0054)     IMPRESSION / MDM / ASSESSMENT AND PLAN / ED COURSE  I reviewed the triage vital signs and the nursing notes.                              Differential diagnosis includes, but is not limited to, intracranial hemorrhage, cervical spine injury, rib fractures, blunt intra-abdominal injury, alcohol intoxication.  Patient's presentation is most consistent with acute presentation with potential threat to life or bodily function.  Patient is a 62 year old male presenting today for ground-level fall 2 days ago with bilateral chest wall pain and extensive abdominal pain throughout.  Concern for traumatic injury secondary to fall.  Vital signs stable on arrival and patient given fentanyl for pain control.  Laboratory workup notable for elevated alcohol level at 351.  Initial lactic acid elevated at 4.2.  Trauma CT scans  ordered which showed no acute traumatic pathology of the head or chest/abdomen/pelvis.  Did discuss with radiologist finding on C6 of a possible osteophyte fracture at the anterior inferior portion.  Discussed this with neurosurgery who recommends no further intervention or management at this time and can be followed up outpatient.  Patient was feeling better after fentanyl but still intoxicated.  Was given 2 L of fluid and signed out pending improvement in clinical condition.  The patient is on the cardiac monitor to evaluate for evidence of arrhythmia and/or significant heart rate changes. Clinical Course as of  08/09/23 1109  Sat Aug 07, 2023  2116 CBC with Differential(!) Largely unremarkable with no significant anemia [DW]  2157 Lactic Acid, Venous(!!): 4.2 [DW]  2157 Alcohol, Ethyl (B)(!!): 351 [DW]  2235 Radiology - tiny displaced fracture of C6 of anterior/inferior aspect that looks like an osteophyte and not a portion of weightbearing. [DW]  2245 No acute intrathoracic, intra-abdominal, or T/L-spine traumatic injuries.  Old rib fractures noted but nothing new. [DW]  2355 Spoke with neurosurgery regarding cervical spine imaging.  He feels nothing to do at this time and follow-up outpatient in neurosurgery clinic for reassessment. [DW]    Clinical Course User Index [DW] Janith Lima, MD     FINAL CLINICAL IMPRESSION(S) / ED DIAGNOSES   Final diagnoses:  Fall, initial encounter  Closed nondisplaced fracture of sixth cervical vertebra, unspecified fracture morphology, initial encounter [facility])  Alcohol use     Rx / DC Orders   ED Discharge Orders          Ordered    Ambulatory referral to Neurosurgery       Comments: C6 anterior/anterior osteophyte fracture follow-up   08/07/23 2358             Note:  This document was prepared using Dragon voice recognition software and may include unintentional dictation errors.   Janith Lima, MD 08/09/23 [phone]
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
## 2023-08-07 NOTE — Discharge Instructions (Addendum)
You were seen in the emergency department today for your fall.  No traumatic injuries throughout your chest or abdomen or head.  There is a small chip off one of your cervical spine bones.  Nothing further to be done with it at this time.  You will have neurosurgery follow-up outpatient in the next several weeks for reassessment.

## 2023-08-08 LAB — LACTIC ACID, PLASMA
Lactic Acid, Venous: 4.5 mmol/L (ref 0.5–1.9)
Lactic Acid, Venous: 4.9 mmol/L (ref 0.5–1.9)

## 2023-08-08 MED ORDER — LACTATED RINGERS IV BOLUS
2000.0000 mL | Freq: Once | INTRAVENOUS | Status: AC
  Administered 2023-08-08: 2000 mL via INTRAVENOUS

## 2023-08-08 MED ORDER — HYDROMORPHONE HCL 1 MG/ML IJ SOLN
0.5000 mg | Freq: Once | INTRAMUSCULAR | Status: AC
  Administered 2023-08-08: 0.5 mg via INTRAVENOUS
  Filled 2023-08-08: qty 0.5

## 2023-08-08 NOTE — ED Provider Notes (Signed)
Patient received in signout from Dr. Anner Crete pending metabolization with anticipated outpatient management.  Extensive workup with evidence of a lactic acid.  This is trended and does begin to downtrend with fluids.  Patient has achieved clinical sobriety and reports feeling better.  Suitable for outpatient management   Delton Prairie, MD 08/08/23 [phone]

## 2023-09-06 ENCOUNTER — Emergency Department: Payer: MEDICAID

## 2023-09-06 ENCOUNTER — Other Ambulatory Visit: Payer: Self-pay

## 2023-09-06 DIAGNOSIS — X58XXXA Exposure to other specified factors, initial encounter: Secondary | ICD-10-CM | POA: Insufficient documentation

## 2023-09-06 DIAGNOSIS — I10 Essential (primary) hypertension: Secondary | ICD-10-CM | POA: Diagnosis not present

## 2023-09-06 DIAGNOSIS — T148XXA Other injury of unspecified body region, initial encounter: Secondary | ICD-10-CM | POA: Diagnosis present

## 2023-09-06 DIAGNOSIS — F10120 Alcohol abuse with intoxication, uncomplicated: Secondary | ICD-10-CM | POA: Diagnosis not present

## 2023-09-06 DIAGNOSIS — W19XXXA Unspecified fall, initial encounter: Secondary | ICD-10-CM | POA: Diagnosis not present

## 2023-09-06 DIAGNOSIS — S0990XA Unspecified injury of head, initial encounter: Secondary | ICD-10-CM | POA: Insufficient documentation

## 2023-09-06 DIAGNOSIS — S299XXA Unspecified injury of thorax, initial encounter: Secondary | ICD-10-CM | POA: Insufficient documentation

## 2023-09-06 DIAGNOSIS — Z5321 Procedure and treatment not carried out due to patient leaving prior to being seen by health care provider: Secondary | ICD-10-CM | POA: Diagnosis not present

## 2023-09-06 LAB — COMPREHENSIVE METABOLIC PANEL
ALT: 18 U/L (ref 0–44)
AST: 16 U/L (ref 15–41)
Albumin: 4.2 g/dL (ref 3.5–5.0)
Alkaline Phosphatase: 67 U/L (ref 38–126)
Anion gap: 10 (ref 5–15)
BUN: 10 mg/dL (ref 8–23)
CO2: 22 mmol/L (ref 22–32)
Calcium: 8.8 mg/dL — ABNORMAL LOW (ref 8.9–10.3)
Chloride: 104 mmol/L (ref 98–111)
Creatinine, Ser: 0.9 mg/dL (ref 0.61–1.24)
GFR, Estimated: >60 mL/min (ref >60)
Glucose, Bld: 109 mg/dL — ABNORMAL HIGH (ref 70–99)
Potassium: 3.2 mmol/L — ABNORMAL LOW (ref 3.5–5.1)
Sodium: 136 mmol/L (ref 135–145)
Total Bilirubin: 0.9 mg/dL (ref 0.3–1.2)
Total Protein: 7 g/dL (ref 6.5–8.1)

## 2023-09-06 LAB — CBC
HCT: 40.4 % (ref 39.0–52.0)
Hemoglobin: 12.4 g/dL — ABNORMAL LOW (ref 13.0–17.0)
MCH: 24.9 pg — ABNORMAL LOW (ref 26.0–34.0)
MCHC: 30.7 g/dL (ref 30.0–36.0)
MCV: 81.3 fL (ref 80.0–100.0)
Platelets: 241 10*3/uL (ref 150–400)
RBC: 4.97 MIL/uL (ref 4.22–5.81)
RDW: 20 % — ABNORMAL HIGH (ref 11.5–15.5)
WBC: 9.9 10*3/uL (ref 4.0–10.5)
nRBC: 0 % (ref 0.0–0.2)

## 2023-09-06 LAB — ETHANOL: Alcohol, Ethyl (B): 278 mg/dL — ABNORMAL HIGH (ref <10)

## 2023-09-06 LAB — TROPONIN I (HIGH SENSITIVITY): Troponin I (High Sensitivity): 3 ng/L (ref <18)
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
## 2023-09-07 ENCOUNTER — Emergency Department
Admission: EM | Admit: 2023-09-07 | Discharge: 2023-09-07 | Discharge status: Against Medical Advice | Payer: MEDICAID | Attending: Emergency Medicine | Admitting: Emergency Medicine

## 2023-09-07 ENCOUNTER — Encounter: Payer: Self-pay | Admitting: Emergency Medicine

## 2023-09-07 ENCOUNTER — Other Ambulatory Visit: Payer: Self-pay

## 2023-09-07 ENCOUNTER — Emergency Department
Admission: EM | Admit: 2023-09-07 | Discharge: 2023-09-08 | Disposition: A | Discharge status: Home / Self Care | Payer: MEDICAID | Attending: Emergency Medicine | Admitting: Emergency Medicine

## 2023-09-07 ENCOUNTER — Emergency Department: Payer: MEDICAID

## 2023-09-07 DIAGNOSIS — J45909 Unspecified asthma, uncomplicated: Secondary | ICD-10-CM | POA: Insufficient documentation

## 2023-09-07 DIAGNOSIS — W19XXXA Unspecified fall, initial encounter: Secondary | ICD-10-CM | POA: Insufficient documentation

## 2023-09-07 DIAGNOSIS — I10 Essential (primary) hypertension: Secondary | ICD-10-CM | POA: Insufficient documentation

## 2023-09-07 DIAGNOSIS — S299XXA Unspecified injury of thorax, initial encounter: Secondary | ICD-10-CM | POA: Insufficient documentation

## 2023-09-07 DIAGNOSIS — S0990XA Unspecified injury of head, initial encounter: Secondary | ICD-10-CM | POA: Insufficient documentation

## 2023-09-07 DIAGNOSIS — F10920 Alcohol use, unspecified with intoxication, uncomplicated: Secondary | ICD-10-CM | POA: Insufficient documentation

## 2023-09-07 NOTE — ED Provider Triage Note (Signed)
Emergency Medicine Provider Triage Evaluation Note  Miguel [name] , a 62 y.o. male  was evaluated in triage.  Pt complains of fall  Review of Systems   Physical Exam  BP 90/71 (BP Location: Right Arm)   Pulse 96   Temp 98.3 F (36.8 C) (Oral)   Resp 18   SpO2 98%  Gen:   Awake, no distress   Resp:  Normal effort  MSK:   Moves extremities without difficulty, ambulating with out difficulty Other:    Medical Decision Making  Medically screening exam initiated at 3:15 AM.  Appropriate orders placed.  Miguel [name] was informed that the remainder of the evaluation will be completed by another provider, this initial triage assessment does not replace that evaluation, and the importance of remaining in the ED until their evaluation is complete.  Pending further workup.   Jene Every, MD 09/07/23 [phone]

## 2023-09-07 NOTE — ED Notes (Signed)
No answer when called several times from lobby 

## 2023-09-07 NOTE — ED Provider Notes (Signed)
Patient eloped   Miguel Every, MD 09/07/23 [phone]

## 2023-09-07 NOTE — ED Triage Notes (Addendum)
EMS brings pt in from home for falling on his ramp hitting his head and rt ribcage; +ETOH; c-collar in place; to triage via w/c, pt wearing only shorts, skin cool to touch, pt sliding out of w/c, denies LOC; pt assisted onto stretcher, fall bracelet, slipper socks in place with warm blankets

## 2023-09-07 NOTE — ED Provider Notes (Signed)
[facility] Provider Note    Event Date/Time   First MD Initiated Contact with Patient 09/07/23 2327     (approximate)   History   Fall   HPI  Miguel [name] is a 62 y.o. male brought to the ED via EMS from home status post mechanical fall. + EtOH, fell, striking his right rib cage on the ramp.  States he hit his head without LOC.  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    OCD (obsessive compulsive disorder)    Renal colic      Active Problem List   Patient Active Problem List   Diagnosis Date Noted   Iron deficiency 05/28/2023   Alcohol withdrawal with inpatient treatment (HCC) 05/24/2023   Total bilirubin, elevated 05/24/2023   Traumatic ecchymosis 05/24/2023   Alcoholic ketoacidosis 04/29/2023   Hypokalemia 04/29/2023   Hypocalcemia 04/29/2023   Essential hypertension 04/29/2023   Depression 04/29/2023   Syncope 04/28/2023   Encounter for screening for HIV 04/16/2023   Encounter for hepatitis C screening test for low risk patient 04/16/2023   Establishing care with new doctor, encounter for 04/16/2023   Hyperthyroidism 04/16/2023   Elevated serum glucose 04/16/2023   Mixed hyperlipidemia 04/16/2023   Centrilobular emphysema (HCC) 04/16/2023   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) 04/16/2023   Orthostatic hypotension 11/28/2020   Genetic testing 07/15/2020   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    Hx of colonic polyps    Generalized anxiety disorder 10/14/2019   Alcoholic cirrhosis of liver without ascites (HCC) 06/14/2019   Alcohol abuse 06/14/2019   AKI  (acute kidney injury) (HCC) 05/27/2019   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   Hypertension 12/05/2015   Sinus tachycardia 12/05/2015   Gout 11/13/2015     Past Surgical History   Past Surgical History:  Procedure Laterality Date   CHOLECYSTECTOMY  2012   COLONOSCOPY     COLONOSCOPY N/A 12/24/2022   Procedure: COLONOSCOPY;  Surgeon: Regis Bill, MD;  Location: [facility] ENDOSCOPY;  Service: Endoscopy;  Laterality: N/A;   COLONOSCOPY WITH PROPOFOL N/A 05/28/2020   Procedure: COLONOSCOPY WITH PROPOFOL;  Surgeon: Wyline Mood, MD;  Location: [facility] ENDOSCOPY;  Service: Gastroenterology;  Laterality: N/A;   ESOPHAGOGASTRODUODENOSCOPY N/A 12/24/2022   Procedure: ESOPHAGOGASTRODUODENOSCOPY (EGD);  Surgeon: Regis Bill, MD;  Location: [facility] ENDOSCOPY;  Service: Endoscopy;  Laterality: N/A;   EXTRACORPOREAL SHOCK WAVE LITHOTRIPSY Left 01/12/2019   Procedure: EXTRACORPOREAL SHOCK WAVE LITHOTRIPSY (ESWL);  Surgeon: Sondra Come, MD;  Location: [facility] ORS;  Service: Urology;  Laterality: Left;   UPPER GI ENDOSCOPY       Home Medications   Prior to Admission medications   Medication Sig Start Date End Date Taking? Authorizing Provider  albuterol (PROAIR HFA) 108 (90 Base) MCG/ACT inhaler Inhale 1-2 puffs into the lungs every 4 (four) hours as needed for wheezing or shortness of breath. 04/16/23   Jacky Kindle, FNP  allopurinol (ZYLOPRIM) 100 MG tablet Take  1 tablet (100 mg total) by mouth daily. 04/16/23 04/10/24  Jacky Kindle, FNP  colchicine 0.6 MG tablet Take 1 tablet (0.6 mg total) by mouth 2 (two) times daily. 06/01/23   Esaw Grandchild A, DO  diclofenac Sodium (VOLTAREN) 1 % GEL Apply 4 g topically 3 (three) times daily as needed (joint aches/pain). 06/17/23   Shaune Pollack, MD  ferrous sulfate 325 (65 FE) MG tablet Take 1 tablet (325 mg total) by mouth daily with breakfast. 06/02/23   Esaw Grandchild A, DO  fluticasone-salmeterol (ADVAIR)  100-50 MCG/ACT AEPB Inhale 1 puff into the lungs 2 (two) times daily. 04/20/23   Jacky Kindle, FNP  folic acid (FOLVITE) 1 MG tablet Take 1 tablet (1 mg total) by mouth daily. 06/01/23   Pennie Banter, DO  HYDROcodone-acetaminophen (NORCO/VICODIN) 5-325 MG tablet Take 1-2 tablets by mouth every 6 (six) hours as needed for moderate pain or severe pain. Patient not taking: Reported on 06/18/2023 06/01/23   Esaw Grandchild A, DO  lisinopril (ZESTRIL) 40 MG tablet TAKE ONE TABLET BY MOUTH ONCE EVERY DAY. 04/16/23   Jacky Kindle, FNP  methylPREDNISolone (MEDROL DOSEPAK) 4 MG TBPK tablet 6 day taper; take as directed on package instructions 06/28/23   Margaretann Loveless, PA-C  metoprolol succinate (TOPROL XL) 25 MG 24 hr tablet Take 1 tablet (25 mg total) by mouth daily. 04/16/23   Jacky Kindle, FNP  Multiple Vitamin (MULTIVITAMIN WITH MINERALS) TABS tablet Take 1 tablet by mouth daily. 06/01/23   Pennie Banter, DO  naltrexone (DEPADE) 50 MG tablet Take 1 tablet (50 mg total) by mouth daily. 06/18/23   Debera Lat, PA-C  sertraline (ZOLOFT) 50 MG tablet Take 1 tablet (50 mg total) by mouth daily. 04/16/23   Jacky Kindle, FNP  thiamine (VITAMIN B-1) 100 MG tablet Take 1 tablet (100 mg total) by mouth daily. 06/01/23   Pennie Banter, DO  traZODone (DESYREL) 100 MG tablet Take 1 tablet (100 mg total) by mouth at bedtime as needed. 04/20/23   Jacky Kindle, FNP     Allergies  Patient has no known allergies.   Family History   Family History  Problem Relation Age of Onset   Alcohol abuse Father    Breast cancer Mother 34   Anxiety disorder Brother    Other Maternal Grandmother        unknown medical history   Other Maternal Grandfather        unknow medical history   Other Paternal Grandmother        unknown medical history   Other Paternal Grandfather        unknown medical history   Healthy Brother      Physical Exam  Triage Vital Signs: ED Triage Vitals  Encounter Vitals  Group     BP 09/07/23 2244 111/68     Systolic BP Percentile --      Diastolic BP Percentile --      Pulse Rate 09/07/23 2244 92     Resp 09/07/23 2244 17     Temp 09/07/23 2244 98.3 F (36.8 C)     Temp Source 09/07/23 2244 Oral     SpO2 09/07/23 2244 98 %     Weight 09/07/23 2232 184 lb 15.5 oz (83.9 kg)     Height 09/07/23 2232 6' (1.829 m)     Head Circumference --      Peak Flow --      Pain Score --  Pain Loc --      Pain Education --      Exclude from Growth Chart --     Updated Vital Signs: BP 119/73   Pulse 73   Temp 97.9 F (36.6 C) (Oral)   Resp 16   Ht 6' (1.829 m)   Wt 83.9 kg   SpO2 99%   BMI 25.09 kg/m    General: Awake, mild distress.  CV:  RRR.  Good peripheral perfusion.  Resp:  Normal effort.  Splinting.  No crepitus.  No retractions.  Mildly diminished, otherwise CTAB. Abd:  Nontender to light or deep palpation.  No distention.  Other:  Head is atraumatic.  PERRL.  EOMI.  Nose is atraumatic.  No dental malocclusion.  No midline cervical spine tenderness to palpation, step-offs or deformities. + EtOH.  Alert and oriented x 3.  CN II-XII grossly intact.  5/5 motor strength and sensation all extremities.  MAE x 4.   ED Results / Procedures / Treatments  Labs (all labs ordered are listed, but only abnormal results are displayed) Labs Reviewed - No data to display   EKG  None   RADIOLOGY I have independently visualized and interpreted patient's imaging studies as well as noted the radiology interpretation:  CT head: No ICH  CT cervical spine: No acute traumatic injury  Right rib series: No acute abnormality, no pneumothorax  Official radiology report(s): CT Cervical Spine Wo Contrast  Result Date: 09/08/2023 CLINICAL DATA:  Head trauma, minor (Age >= 65y); Facial trauma, blunt EMS brings pt in from home for falling on his ramp hitting his head and rt ribcage; +ETOH; c-collar in place; to triage via w/c with no distress noted, denies  LOC EXAM: CT HEAD WITHOUT CONTRAST CT CERVICAL SPINE WITHOUT CONTRAST TECHNIQUE: Multidetector CT imaging of the head and cervical spine was performed following the standard protocol without intravenous contrast. Multiplanar CT image reconstructions of the cervical spine were also generated. RADIATION DOSE REDUCTION: This exam was performed according to the departmental dose-optimization program which includes automated exposure control, adjustment of the mA and/or kV according to patient size and/or use of iterative reconstruction technique. COMPARISON:  CT head and C-spine 09/06/2023, CT head and C-spine 08/07/2023 FINDINGS: CT HEAD FINDINGS Brain: Cerebral ventricle sizes are concordant with the degree of cerebral volume loss. Patchy and confluent areas of decreased attenuation are noted throughout the deep and periventricular white matter of the cerebral hemispheres bilaterally, compatible with chronic microvascular ischemic disease. No evidence of large-territorial acute infarction. No parenchymal hemorrhage. No mass lesion. No extra-axial collection. No mass effect or midline shift. No hydrocephalus. Basilar cisterns are patent. Vascular: No hyperdense vessel. Skull: No acute fracture or focal lesion. Sinuses/Orbits: Bilateral maxillary sinus mucosal thickening. Otherwise paranasal sinuses and mastoid air cells are clear. The orbits are unremarkable. Other: None. CT CERVICAL SPINE FINDINGS Alignment: Normal. Skull base and vertebrae: Multilevel anterior osteophyte formation. No acute fracture. No aggressive appearing focal osseous lesion or focal pathologic process. Soft tissues and spinal canal: No prevertebral fluid or swelling. No visible canal hematoma. Upper chest: Unremarkable. 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 IMPRESSION: 1. No acute intracranial abnormality. 2. No acute displaced fracture or traumatic listhesis of the cervical spine. Electronically Signed   By:  Tish Frederickson M.D.   On: 09/08/2023 00:50   CT Head Wo Contrast  Result Date: 09/08/2023 CLINICAL DATA:  Head trauma, minor (Age >= 65y); Facial trauma, blunt EMS brings pt in from home for  falling on his ramp hitting his head and rt ribcage; +ETOH; c-collar in place; to triage via w/c with no distress noted, denies LOC EXAM: CT HEAD WITHOUT CONTRAST CT CERVICAL SPINE WITHOUT CONTRAST TECHNIQUE: Multidetector CT imaging of the head and cervical spine was performed following the standard protocol without intravenous contrast. Multiplanar CT image reconstructions of the cervical spine were also generated. RADIATION DOSE REDUCTION: This exam was performed according to the departmental dose-optimization program which includes automated exposure control, adjustment of the mA and/or kV according to patient size and/or use of iterative reconstruction technique. COMPARISON:  CT head and C-spine 09/06/2023, CT head and C-spine 08/07/2023 FINDINGS: CT HEAD FINDINGS Brain: Cerebral ventricle sizes are concordant with the degree of cerebral volume loss. Patchy and confluent areas of decreased attenuation are noted throughout the deep and periventricular white matter of the cerebral hemispheres bilaterally, compatible with chronic microvascular ischemic disease. No evidence of large-territorial acute infarction. No parenchymal hemorrhage. No mass lesion. No extra-axial collection. No mass effect or midline shift. No hydrocephalus. Basilar cisterns are patent. Vascular: No hyperdense vessel. Skull: No acute fracture or focal lesion. Sinuses/Orbits: Bilateral maxillary sinus mucosal thickening. Otherwise paranasal sinuses and mastoid air cells are clear. The orbits are unremarkable. Other: None. CT CERVICAL SPINE FINDINGS Alignment: Normal. Skull base and vertebrae: Multilevel anterior osteophyte formation. No acute fracture. No aggressive appearing focal osseous lesion or focal pathologic process. Soft tissues and spinal  canal: No prevertebral fluid or swelling. No visible canal hematoma. Upper chest: Unremarkable. 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 IMPRESSION: 1. No acute intracranial abnormality. 2. No acute displaced fracture or traumatic listhesis of the cervical spine. Electronically Signed   By: Tish Frederickson M.D.   On: 09/08/2023 00:50   DG Ribs Unilateral W/Chest Right  Result Date: 09/08/2023 CLINICAL DATA:  Recent fall with rib pain, initial encounter EXAM: RIGHT RIBS AND CHEST - 3+ VIEW COMPARISON:  None Available. FINDINGS: Cardiac shadows within normal limits. Lungs are well aerated bilaterally. No focal infiltrate or sizable effusion is seen. No bony abnormality is noted. Note is made of 5 mm stone in right kidney stable from prior CT examination. Old rib fractures are noted on the right. No acute deformity is seen. IMPRESSION: Multiple old rib fractures on the right. No acute deformity is noted. Note is made of previously diagnosed right renal stone. Electronically Signed   By: Alcide Clever M.D.   On: 09/08/2023 00:35     PROCEDURES:  Critical Care performed: No  .1-3 Lead EKG Interpretation  Performed by: Irean Hong, MD Authorized by: Irean Hong, MD     Interpretation: normal     ECG rate:  90   ECG rate assessment: normal     Rhythm: sinus rhythm     Ectopy: none     Conduction: normal   Comments:     Patient placed on cardiac monitor to evaluate for arrhythmias    MEDICATIONS ORDERED IN ED: Medications  oxyCODONE-acetaminophen (PERCOCET/ROXICET) 5-325 MG per tablet 1 tablet (1 tablet Oral Given 09/08/23 0019)  oxyCODONE-acetaminophen (PERCOCET/ROXICET) 5-325 MG per tablet 1 tablet (1 tablet Oral Given 09/08/23 0412)     IMPRESSION / MDM / ASSESSMENT AND PLAN / ED COURSE  I reviewed the triage vital signs and the nursing notes.                             62 year old male presenting with minor  head injury and right rib pain status  post mechanical fall.  Differential diagnosis includes but is not limited to ICH, SDH, cervical spine injury, rib fracture, contusion, pneumothorax, etc.  I personally reviewed patient's records and note majority ED visits, last within the past 24 hours for fall but patient left without being seen.  Patient's presentation is most consistent with acute complicated illness / injury requiring diagnostic workup.  The patient is on the cardiac monitor to evaluate for evidence of arrhythmia and/or significant heart rate changes.  Imaging studies are pending.  Will administer Percocet and observe until sobriety.  Clinical Course as of 09/08/23 1610  Wed Sep 08, 2023  0115 CT head/cervical spine, right rib series negative for acute traumatic injury.  Will continue to monitor and care for patient until sobriety. [JS]  [id number] Patient awake, alert, speaking with his wife on the telephone.  Offered bus pass which patient declines.  States he will take a cab in the morning.  Strict return precautions given.  Patient verbalizes understanding and agrees with plan of care. [JS]    Clinical Course User Index [JS] Irean Hong, MD     FINAL CLINICAL IMPRESSION(S) / ED DIAGNOSES   Final diagnoses:  Fall, initial encounter  Rib injury  Minor head injury, initial encounter  Alcoholic intoxication without complication (HCC)     Rx / DC Orders   ED Discharge Orders     None        Note:  This document was prepared using Dragon voice recognition software and may include unintentional dictation errors.   Irean Hong, MD 09/08/23 [phone]

## 2023-09-07 NOTE — ED Notes (Signed)
**  Pt placed into hospital stretcher with fall precautions in place to assist pt safety due to confusion and ETOH.

## 2023-09-07 NOTE — ED Notes (Signed)
Patient transported to CT 

## 2023-09-08 MED ORDER — OXYCODONE-ACETAMINOPHEN 5-325 MG PO TABS
1.0000 | ORAL_TABLET | Freq: Once | ORAL | Status: AC
Start: 2023-09-08 — End: 2023-09-08
  Administered 2023-09-08: 1 via ORAL
  Filled 2023-09-08: qty 1

## 2023-09-08 MED ORDER — OXYCODONE-ACETAMINOPHEN 5-325 MG PO TABS
1.0000 | ORAL_TABLET | Freq: Once | ORAL | Status: AC
  Administered 2023-09-08: 1 via ORAL
  Filled 2023-09-08: qty 1

## 2023-09-08 NOTE — ED Notes (Signed)
Pt asking for pain medicine - reminded pt of medication administration at 0019. Pt requesting room - reminded pt of limited room availability at this time. Urinal emptied of urine ( ) and soiled blanket replaced with two warm blankets.

## 2023-09-08 NOTE — Discharge Instructions (Addendum)
Drink alcohol only in moderation.  You may take Tylenol and/or Ibuprofen as needed for pain.  Return to the ER for worsening symptoms, persistent vomiting, difficulty breathing or other concerns.

## 2023-09-08 NOTE — ED Notes (Signed)
Patient removed his c-collar and is refusing to put it back on.

## 2023-09-08 NOTE — ED Notes (Signed)
Per pt's request, this nurse attempted to call pt's wife and let her know he was here in the ED. Phone was not answered and there was no VM option.

## 2023-09-21 ENCOUNTER — Ambulatory Visit: Payer: MEDICAID | Admitting: Family Medicine

## 2023-09-21 ENCOUNTER — Ambulatory Visit: Payer: Self-pay | Admitting: *Deleted

## 2023-09-21 NOTE — Telephone Encounter (Signed)
  Chief Complaint: SOB Symptoms: SOB S/P fall 09/07/23, worsening. Audible wheeze,SOB at rest, speech faltering. Pt tearful. Rib pain increased. Subjective fever Frequency: Gradual worsening Pertinent Negatives: Patient denies  Disposition: [x] ED /[] Urgent Care (no appt availability in office) / [] Appointment(In office/virtual)/ []  [facility]/ [] Home Care/ [] Refused Recommended Disposition /[] [place] [facility]/ []  Follow-up with PCP Additional Notes:  Advised ED, states will call EMS. Reason for Disposition  [1] MODERATE difficulty breathing (e.g., speaks in phrases, SOB even at rest, pulse 100-120) AND [2] NEW-onset or WORSE than normal  Answer Assessment - Initial Assessment Questions 1. RESPIRATORY STATUS: "Describe your breathing?" (e.g., wheezing, shortness of breath, unable to speak, severe coughing)      Wheezing, SOB 2. ONSET: "When did this breathing problem begin?"      S/P fall.  3. PATTERN "Does the difficult breathing come and go, or has it been constant since it started?"      Constant 4. SEVERITY: "How bad is your breathing?" (e.g., mild, moderate, severe)    - MILD: No SOB at rest, mild SOB with walking, speaks normally in sentences, can lie down, no retractions, pulse < 100.    - MODERATE: SOB at rest, SOB with minimal exertion and prefers to sit, cannot lie down flat, speaks in phrases, mild retractions, audible wheezing, pulse 100-120.    - SEVERE: Very SOB at rest, speaks in single words, struggling to breathe, sitting hunched forward, retractions, pulse > 120      Moderate 5. RECURRENT SYMPTOM: "Have you had difficulty breathing before?" If Yes, ask: "When was the last time?" and "What happened that time?"      yes 9. OTHER SYMPTOMS: "Do you have any other symptoms? (e.g., dizziness, runny nose, cough, chest pain, fever)     Rib pain worsened 10. O2 SATURATION MONITOR:  "Do you use an oxygen saturation monitor (pulse oximeter) at home?" If Yes, ask:  "What is your reading (oxygen level) today?" "What is your usual oxygen saturation reading?" (e.g., 95%)       NA  Protocols used: Breathing Difficulty-A-AH

## 2023-09-23 ENCOUNTER — Emergency Department
Admission: EM | Admit: 2023-09-23 | Discharge: 2023-09-23 | Disposition: A | Discharge status: Home / Self Care | Payer: MEDICAID | Attending: Emergency Medicine | Admitting: Emergency Medicine

## 2023-09-23 ENCOUNTER — Other Ambulatory Visit: Payer: Self-pay

## 2023-09-23 ENCOUNTER — Encounter: Payer: Self-pay | Admitting: Emergency Medicine

## 2023-09-23 ENCOUNTER — Emergency Department: Payer: MEDICAID

## 2023-09-23 DIAGNOSIS — R062 Wheezing: Secondary | ICD-10-CM | POA: Insufficient documentation

## 2023-09-23 DIAGNOSIS — F10920 Alcohol use, unspecified with intoxication, uncomplicated: Secondary | ICD-10-CM

## 2023-09-23 DIAGNOSIS — R0789 Other chest pain: Secondary | ICD-10-CM | POA: Diagnosis not present

## 2023-09-23 DIAGNOSIS — F10120 Alcohol abuse with intoxication, uncomplicated: Secondary | ICD-10-CM | POA: Insufficient documentation

## 2023-09-23 DIAGNOSIS — R0602 Shortness of breath: Secondary | ICD-10-CM | POA: Insufficient documentation

## 2023-09-23 LAB — CBC
HCT: 36 % — ABNORMAL LOW (ref 39.0–52.0)
Hemoglobin: 11.2 g/dL — ABNORMAL LOW (ref 13.0–17.0)
MCH: 26.2 pg (ref 26.0–34.0)
MCHC: 31.1 g/dL (ref 30.0–36.0)
MCV: 84.3 fL (ref 80.0–100.0)
Platelets: 200 10*3/uL (ref 150–400)
RBC: 4.27 MIL/uL (ref 4.22–5.81)
RDW: 23.7 % — ABNORMAL HIGH (ref 11.5–15.5)
WBC: 7.1 10*3/uL (ref 4.0–10.5)
nRBC: 0 % (ref 0.0–0.2)

## 2023-09-23 LAB — URINE DRUG SCREEN, QUALITATIVE (ARMC ONLY)
Amphetamines, Ur Screen: NOT DETECTED
Barbiturates, Ur Screen: NOT DETECTED
Benzodiazepine, Ur Scrn: NOT DETECTED
Cannabinoid 50 Ng, Ur ~~LOC~~: POSITIVE — AB
Cocaine Metabolite,Ur ~~LOC~~: NOT DETECTED
MDMA (Ecstasy)Ur Screen: NOT DETECTED
Methadone Scn, Ur: NOT DETECTED
Opiate, Ur Screen: NOT DETECTED
Phencyclidine (PCP) Ur S: NOT DETECTED
Tricyclic, Ur Screen: NOT DETECTED

## 2023-09-23 LAB — COMPREHENSIVE METABOLIC PANEL
ALT: 21 U/L (ref 0–44)
AST: 26 U/L (ref 15–41)
Albumin: 3.7 g/dL (ref 3.5–5.0)
Alkaline Phosphatase: 65 U/L (ref 38–126)
Anion gap: 10 (ref 5–15)
BUN: 14 mg/dL (ref 8–23)
CO2: 25 mmol/L (ref 22–32)
Calcium: 8.5 mg/dL — ABNORMAL LOW (ref 8.9–10.3)
Chloride: 106 mmol/L (ref 98–111)
Creatinine, Ser: 0.81 mg/dL (ref 0.61–1.24)
GFR, Estimated: >60 mL/min (ref >60)
Glucose, Bld: 106 mg/dL — ABNORMAL HIGH (ref 70–99)
Potassium: 3.8 mmol/L (ref 3.5–5.1)
Sodium: 141 mmol/L (ref 135–145)
Total Bilirubin: 0.6 mg/dL (ref <1.2)
Total Protein: 6.9 g/dL (ref 6.5–8.1)

## 2023-09-23 LAB — ETHANOL: Alcohol, Ethyl (B): 372 mg/dL (ref <10)

## 2023-09-23 MED ORDER — IPRATROPIUM-ALBUTEROL 0.5-2.5 (3) MG/3ML IN SOLN
3.0000 mL | Freq: Once | RESPIRATORY_TRACT | Status: AC
  Administered 2023-09-23: 3 mL via RESPIRATORY_TRACT

## 2023-09-23 MED ORDER — DEXAMETHASONE SODIUM PHOSPHATE 10 MG/ML IJ SOLN
10.0000 mg | Freq: Once | INTRAMUSCULAR | Status: AC
  Administered 2023-09-23: 10 mg via INTRAMUSCULAR
  Filled 2023-09-23: qty 1

## 2023-09-23 MED ORDER — DICLOFENAC SODIUM 1 % EX GEL
4.0000 g | Freq: Three times a day (TID) | CUTANEOUS | 0 refills | Status: AC | PRN
  Filled 2023-09-23: qty 100, 9d supply, fill #0

## 2023-09-23 MED ORDER — IPRATROPIUM-ALBUTEROL 0.5-2.5 (3) MG/3ML IN SOLN
3.0000 mL | Freq: Once | RESPIRATORY_TRACT | Status: AC
  Administered 2023-09-23: 3 mL via RESPIRATORY_TRACT
  Filled 2023-09-23: qty 6

## 2023-09-23 MED ORDER — ALBUTEROL SULFATE HFA 108 (90 BASE) MCG/ACT IN AERS
2.0000 | INHALATION_SPRAY | RESPIRATORY_TRACT | 1 refills | Status: DC | PRN
Start: 2023-09-23 — End: 2023-12-23
  Filled 2023-09-23: qty 18, 17d supply, fill #0

## 2023-09-23 MED ORDER — ALBUTEROL SULFATE (2.5 MG/3ML) 0.083% IN NEBU: 3.0000 mL | INHALATION_SOLUTION | RESPIRATORY_TRACT | Status: DC | PRN

## 2023-09-23 MED ORDER — PREDNISONE 20 MG PO TABS: 40.0000 mg | ORAL_TABLET | Freq: Once | ORAL | Status: DC

## 2023-09-23 MED ORDER — PREDNISONE 20 MG PO TABS
40.0000 mg | ORAL_TABLET | Freq: Every day | ORAL | 0 refills | Status: AC
  Filled 2023-09-23: qty 8, 4d supply, fill #0

## 2023-09-23 NOTE — ED Notes (Signed)
Pt accusing staff of not being in room in "hours". Patient informed staff has been rounding on patient regularly and that this RN was just in room to fix EKG leads. Patient calling this RN a liar and continues to cuss at staff.

## 2023-09-23 NOTE — ED Notes (Signed)
Pt hitting walls in room. Patient states "when's the doctor going to come". This RN enforced rules and policies and that hospital destruction is not tolerated. Explained to patient doctor is currently busy with emergent patients. Patient states "I dont care, I want to go".

## 2023-09-23 NOTE — ED Notes (Signed)
Pt threatening staff if the doctor does not come back to talk to him and give him "ativan" to calm down. Pt informed that threats to staff are not tolerated and he needs to wait in his room for doctor to come back and speak with him. Pt continues to curse at staff and mumble rude things under breath about this RN.

## 2023-09-23 NOTE — ED Notes (Signed)
Pt cussing at staff and refusing to keep monitoring devices on

## 2023-09-23 NOTE — ED Provider Notes (Signed)
[facility] Provider Note    Event Date/Time   First MD Initiated Contact with Patient 09/23/23 1540     (approximate)   History   Alcohol Intoxication and Shortness of Breath   HPI  Miguel [name] is a 62 y.o. male here with shortness of breath.  The patient is very well known to this ED he.  He states that he fell 2 weeks ago, and has since had left-sided chest pain.  He is also had a cough with wheezing.  He states he has been drinking but no more than usual.  EMS was at his house because he was coughing and they convinced him to come for evaluation because his wife was worried about him.  He states that he is mad that he is here.  Denies any SI or HI.  He admits to drink but no more than usual.  His wheezing is persistent.  Has not been using his albuterol inhaler.  He does smoke.     Physical Exam   Triage Vital Signs: ED Triage Vitals  Encounter Vitals Group     BP 09/23/23 1550 127/76     Systolic BP Percentile --      Diastolic BP Percentile --      Pulse Rate 09/23/23 1550 90     Resp 09/23/23 1550 (!) 22     Temp 09/23/23 1550 98 F (36.7 C)     Temp Source 09/23/23 1550 Oral     SpO2 09/23/23 1550 94 %     Weight --      Height --      Head Circumference --      Peak Flow --      Pain Score 09/23/23 1551 0     Pain Loc --      Pain Education --      Exclude from Growth Chart --     Most recent vital signs: Vitals:   09/23/23 1550  BP: 127/76  Pulse: 90  Resp: (!) 22  Temp: 98 F (36.7 C)  SpO2: 94%     General: Awake, no distress.  CV:  Good peripheral perfusion.  Resp:  Normal work of breathing.  Slight expiratory wheezes, lungs otherwise clear.  No chest wall bruising. Abd:  No distention.  No tenderness. Other:  Admits to drinking but does not clinically appear overtly intoxicated.  He is alert and oriented x 4.  Answers questions appropriately.  Ambulatory without significant difficulty.  Speech not significantly  slurred.   ED Results / Procedures / Treatments   Labs (all labs ordered are listed, but only abnormal results are displayed) Labs Reviewed  COMPREHENSIVE METABOLIC PANEL - Abnormal; Notable for the following components:      Result Value   Glucose, Bld 106 (*)    Calcium 8.5 (*)    All other components within normal limits  ETHANOL - Abnormal; Notable for the following components:   Alcohol, Ethyl (B) 372 (*)    All other components within normal limits  CBC - Abnormal; Notable for the following components:   Hemoglobin 11.2 (*)    HCT 36.0 (*)    RDW 23.7 (*)    All other components within normal limits  URINE DRUG SCREEN, QUALITATIVE ([facility] ONLY) - Abnormal; Notable for the following components:   Cannabinoid 50 Ng, Ur Reynolds POSITIVE (*)    All other components within normal limits     EKG Sinus rhythm, ventricular at 90.  PR 168, QRS 95, QTc 464.  No acute ST elevations or depressions.  No EKG evidence of acute ischemia or infarct.   RADIOLOGY Chest x-ray: No active disease   I also independently reviewed and agree with radiologist interpretations.   PROCEDURES:  Critical Care performed: No   MEDICATIONS ORDERED IN ED: Medications  albuterol (PROVENTIL) (2.5 MG/3ML) 0.083% nebulizer solution 3 mL (has no administration in time range)  ipratropium-albuterol (DUONEB) 0.5-2.5 (3) MG/3ML nebulizer solution 3 mL (3 mLs Nebulization Given 09/23/23 1609)  ipratropium-albuterol (DUONEB) 0.5-2.5 (3) MG/3ML nebulizer solution 3 mL (3 mLs Nebulization Given 09/23/23 1609)  dexamethasone (DECADRON) injection 10 mg (10 mg Intramuscular Given 09/23/23 1748)     IMPRESSION / MDM / ASSESSMENT AND PLAN / ED COURSE  I reviewed the triage vital signs and the nursing notes.                              Differential diagnosis includes, but is not limited to, mild wheezing associated respiratory illness, pneumonia, chest wall pain, secondary gain, alcohol intoxication  Patient's  presentation is most consistent with acute presentation with potential threat to life or bodily function.  The patient is on the cardiac monitor to evaluate for evidence of arrhythmia and/or significant heart rate changes  63 year old male here with left-sided chest wall pain.  Patient had a recent fall.  Imaging shows no fracture or pneumothorax.  He has some scant wheezing for which she was given steroids and breathing treatments.  Suspect he may have a component of musculoskeletal chest wall pain with likely underlying COPD.  Patient has an elevated alcohol level but this is actually not far from his baseline and clinically does not appear intoxicated.  Amatory no difficulty.  He denies any SI or HI.  He is otherwise at his baseline and is well-known to myself as well as this ED.  Will discharge home.    FINAL CLINICAL IMPRESSION(S) / ED DIAGNOSES   Final diagnoses:  Alcoholic intoxication without complication (HCC)  Chest wall pain  Wheezing     Rx / DC Orders   ED Discharge Orders          Ordered    predniSONE (DELTASONE) 20 MG tablet  Daily        09/23/23 1854    diclofenac Sodium (VOLTAREN) 1 % GEL  3 times daily PRN        09/23/23 1854    albuterol (VENTOLIN HFA) 108 (90 Base) MCG/ACT inhaler  Every 4 hours PRN        09/23/23 1854             Note:  This document was prepared using Dragon voice recognition software and may include unintentional dictation errors.   Shaune Pollack, MD 09/23/23 [phone]

## 2023-09-23 NOTE — ED Notes (Signed)
Patient continues to make threatening remarks to staff "if doctor does not come in here soon".

## 2023-09-23 NOTE — ED Triage Notes (Signed)
Patient presents via [facility] from home. Pt wife called ems due to patient being intoxicated. Upon arrival, patient appears intoxicated and is slurring words. Pt endorses drinking "2" but would not elaborate what he drank. Patient denies SI/HI/AH/VH. Pt endorses depression. Pt noted to have expiratory wheezes- pt reports having to use albuterol inhaler a lot this week at home. Pt placed on 2L by this RN due to oxygen being 88% on room air upon arrival.

## 2023-09-23 NOTE — ED Notes (Signed)
Pt refusing discharge vitals.

## 2023-09-23 NOTE — ED Notes (Signed)
Patient ripped IV out of arm. This RN removed bandage and cleaned blood from patient arm. Bleeding controlled at this time.

## 2023-09-23 NOTE — ED Notes (Signed)
Patient walking around independently in room with steady gait. Patient reports he is doing "karate" in room. Pt hitting walls in room. Pt informed not to hit hospital walls or property.

## 2023-09-24 ENCOUNTER — Other Ambulatory Visit: Payer: Self-pay

## 2023-10-05 ENCOUNTER — Other Ambulatory Visit: Payer: Self-pay

## 2023-10-12 ENCOUNTER — Other Ambulatory Visit: Payer: Self-pay

## 2023-10-12 ENCOUNTER — Emergency Department: Payer: MEDICAID

## 2023-10-12 ENCOUNTER — Emergency Department
Admission: EM | Admit: 2023-10-12 | Discharge: 2023-10-12 | Disposition: A | Discharge status: Home / Self Care | Payer: MEDICAID | Attending: Emergency Medicine | Admitting: Emergency Medicine

## 2023-10-12 DIAGNOSIS — Y908 Blood alcohol level of 240 mg/100 ml or more: Secondary | ICD-10-CM | POA: Insufficient documentation

## 2023-10-12 DIAGNOSIS — E860 Dehydration: Secondary | ICD-10-CM | POA: Diagnosis not present

## 2023-10-12 DIAGNOSIS — R112 Nausea with vomiting, unspecified: Secondary | ICD-10-CM | POA: Insufficient documentation

## 2023-10-12 DIAGNOSIS — R1013 Epigastric pain: Secondary | ICD-10-CM | POA: Diagnosis not present

## 2023-10-12 DIAGNOSIS — F1010 Alcohol abuse, uncomplicated: Secondary | ICD-10-CM | POA: Diagnosis not present

## 2023-10-12 LAB — CBC
HCT: 41.2 % (ref 39.0–52.0)
Hemoglobin: 13.1 g/dL (ref 13.0–17.0)
MCH: 26.4 pg (ref 26.0–34.0)
MCHC: 31.8 g/dL (ref 30.0–36.0)
MCV: 82.9 fL (ref 80.0–100.0)
Platelets: 218 10*3/uL (ref 150–400)
RBC: 4.97 MIL/uL (ref 4.22–5.81)
RDW: 20.5 % — ABNORMAL HIGH (ref 11.5–15.5)
WBC: 8.5 10*3/uL (ref 4.0–10.5)
nRBC: 0 % (ref 0.0–0.2)

## 2023-10-12 LAB — COMPREHENSIVE METABOLIC PANEL
ALT: 24 U/L (ref 0–44)
AST: 24 U/L (ref 15–41)
Albumin: 4.5 g/dL (ref 3.5–5.0)
Alkaline Phosphatase: 78 U/L (ref 38–126)
Anion gap: 15 (ref 5–15)
BUN: 27 mg/dL — ABNORMAL HIGH (ref 8–23)
CO2: 16 mmol/L — ABNORMAL LOW (ref 22–32)
Calcium: 9.2 mg/dL (ref 8.9–10.3)
Chloride: 97 mmol/L — ABNORMAL LOW (ref 98–111)
Creatinine, Ser: 1.36 mg/dL — ABNORMAL HIGH (ref 0.61–1.24)
GFR, Estimated: 58 mL/min — ABNORMAL LOW (ref >60)
Glucose, Bld: 112 mg/dL — ABNORMAL HIGH (ref 70–99)
Potassium: 4.9 mmol/L (ref 3.5–5.1)
Sodium: 128 mmol/L — ABNORMAL LOW (ref 135–145)
Total Bilirubin: 0.9 mg/dL (ref <1.2)
Total Protein: 7.8 g/dL (ref 6.5–8.1)

## 2023-10-12 LAB — LIPASE, BLOOD: Lipase: 63 U/L — ABNORMAL HIGH (ref 11–51)

## 2023-10-12 LAB — ETHANOL: Alcohol, Ethyl (B): 283 mg/dL — ABNORMAL HIGH (ref <10)

## 2023-10-12 MED ORDER — ONDANSETRON HCL 4 MG/2ML IJ SOLN
4.0000 mg | Freq: Once | INTRAMUSCULAR | Status: AC
  Administered 2023-10-12: 4 mg via INTRAVENOUS
  Filled 2023-10-12: qty 2

## 2023-10-12 MED ORDER — LORAZEPAM 1 MG PO TABS
1.0000 mg | ORAL_TABLET | Freq: Once | ORAL | Status: AC
  Administered 2023-10-12: 1 mg via ORAL
  Filled 2023-10-12: qty 1

## 2023-10-12 MED ORDER — FAMOTIDINE 20 MG PO TABS
20.0000 mg | ORAL_TABLET | Freq: Once | ORAL | Status: AC
  Administered 2023-10-12: 20 mg via ORAL
  Filled 2023-10-12: qty 1

## 2023-10-12 MED ORDER — SODIUM CHLORIDE 0.9 % IV BOLUS
1000.0000 mL | Freq: Once | INTRAVENOUS | Status: AC
Start: 2023-10-12 — End: 2023-10-12
  Administered 2023-10-12: 1000 mL via INTRAVENOUS

## 2023-10-12 MED ORDER — SODIUM CHLORIDE 0.9 % IV BOLUS
500.0000 mL | Freq: Once | INTRAVENOUS | Status: AC
  Administered 2023-10-12: 500 mL via INTRAVENOUS

## 2023-10-12 MED ORDER — LORAZEPAM 2 MG/ML IJ SOLN
1.0000 mg | Freq: Once | INTRAMUSCULAR | Status: AC
  Administered 2023-10-12: 1 mg via INTRAVENOUS
  Filled 2023-10-12: qty 1

## 2023-10-12 MED ORDER — THIAMINE HCL 100 MG/ML IJ SOLN
100.0000 mg | Freq: Once | INTRAMUSCULAR | Status: AC
  Administered 2023-10-12: 100 mg via INTRAVENOUS
  Filled 2023-10-12: qty 2

## 2023-10-12 NOTE — ED Provider Notes (Signed)
  Physical Exam  BP (!) 149/86 (BP Location: Left Arm)   Pulse 78   Temp 98.3 F (36.8 C) (Oral)   Resp 14   Ht 6' (1.829 m)   SpO2 100%   BMI 25.09 kg/m   Physical Exam  Procedures  Procedures  ED Course / MDM    Medical Decision Making Amount and/or Complexity of Data Reviewed Labs: ordered. Radiology: ordered.  Risk Prescription drug management.   Received patient in signout.  62 year old male with history of chronic alcohol abuse presenting today for abdominal pain, nausea, vomiting after recent binge drinking of wine.  Physical exam largely unremarkable with initial provider.  Laboratory workup with noted alcohol elevation but otherwise mostly reassuring and consistent with his baseline.  Patient was given multiple medications for fluids, nausea, and vitamin repletion.  Patient was signed out pending p.o. challenge.  Suspected mild alcohol ketoacidosis and dehydration.  CT exam was unremarkable.  Patient was able to tolerate p.o. without difficulty.  Able to ambulate in the ED without difficulty.  Safe for discharge with outpatient follow-up with his PCP.  Given strict return precautions.     Janith Lima, MD 10/12/23 [phone]

## 2023-10-12 NOTE — Discharge Instructions (Signed)
Please follow-up with your primary care provider.  Please abstain from alcohol use as this likely contributed to your symptoms today.

## 2023-10-12 NOTE — ED Provider Notes (Signed)
[facility] Provider Note    Event Date/Time   First MD Initiated Contact with Patient 10/12/23 1120     (approximate)   History   Emesis   HPI  Miguel [name] is a 62 y.o. male with extensive past medical history including chronic alcohol abuse here with abdominal pain, nausea, vomiting.  The patient states that he recently has begun binging wine for the last several weeks.  He had been sober for a month or 2 prior to that.  He reports that he has been unable to keep anything down.  He feels fatigued.  He is tired.  He feels depressed but is not suicidal or homicidal.  He has a history of similar presentations.  Denies any known fevers.  Denies any known recent     Physical Exam   Triage Vital Signs: ED Triage Vitals  Encounter Vitals Group     BP 10/12/23 1011 (!) 125/90     Systolic BP Percentile --      Diastolic BP Percentile --      Pulse Rate 10/12/23 1014 96     Resp 10/12/23 1011 18     Temp 10/12/23 1014 97.8 F (36.6 C)     Temp Source 10/12/23 1131 Oral     SpO2 10/12/23 1014 100 %     Weight --      Height 10/12/23 1013 6' (1.829 m)     Head Circumference --      Peak Flow --      Pain Score 10/12/23 1012 4     Pain Loc --      Pain Education --      Exclude from Growth Chart --     Most recent vital signs: Vitals:   10/12/23 1535 10/12/23 1601  BP: (!) 149/86 123/87  Pulse: 78 (!) 104  Resp: 14 16  Temp: 98.3 F (36.8 C)   SpO2: 100% 100%     General: Awake, no distress.  CV:  Good peripheral perfusion.  Regular rate and rhythm. Resp:  Normal work of breathing.  Lungs clear to auscultation bilaterally. Abd:  No distention.  Minimal epigastric tenderness.  No guarding rebound. Other:  Moist mucous membranes.   ED Results / Procedures / Treatments   Labs (all labs ordered are listed, but only abnormal results are displayed) Labs Reviewed  CBC - Abnormal; Notable for the following components:      Result Value    RDW 20.5 (*)    All other components within normal limits  COMPREHENSIVE METABOLIC PANEL - Abnormal; Notable for the following components:   Sodium 128 (*)    Chloride 97 (*)    CO2 16 (*)    Glucose, Bld 112 (*)    BUN 27 (*)    Creatinine, Ser 1.36 (*)    GFR, Estimated 58 (*)    All other components within normal limits  LIPASE, BLOOD - Abnormal; Notable for the following components:   Lipase 63 (*)    All other components within normal limits  ETHANOL - Abnormal; Notable for the following components:   Alcohol, Ethyl (B) 283 (*)    All other components within normal limits     EKG Normal sinus rhythm, ventricular at 83.  PR 156, QRS 1 2, QTc 458.  No acute ST elevations or depression.  No acute events of acute ischemia or infarct.   RADIOLOGY CT abdomen/pelvis: No acute abnormality   I also independently reviewed  and agree with radiologist interpretations.   PROCEDURES:  Critical Care performed: No   MEDICATIONS ORDERED IN ED: Medications  sodium chloride 0.9 % bolus 1,000 mL (0 mLs Intravenous Stopped 10/12/23 1440)  LORazepam (ATIVAN) injection 1 mg (1 mg Intravenous Given 10/12/23 1159)  thiamine (VITAMIN B1) injection 100 mg (100 mg Intravenous Given 10/12/23 1206)  ondansetron (ZOFRAN) injection 4 mg (4 mg Intravenous Given 10/12/23 1202)  famotidine (PEPCID) tablet 20 mg (20 mg Oral Given 10/12/23 1447)  LORazepam (ATIVAN) tablet 1 mg (1 mg Oral Given 10/12/23 1447)  sodium chloride 0.9 % bolus 500 mL (0 mLs Intravenous Stopped 10/12/23 1603)     IMPRESSION / MDM / ASSESSMENT AND PLAN / ED COURSE  I reviewed the triage vital signs and the nursing notes.                              Differential diagnosis includes, but is not limited to, alcoholic gastritis, pancreatitis, enteritis, diverticulitis, obstruction, cholecystitis, pneumonia, aspiration, metabolic abnormality  Patient's presentation is most consistent with acute presentation with potential  threat to life or bodily function.  The patient is on the cardiac monitor to evaluate for evidence of arrhythmia and/or significant heart rate changes   62 year old male well-known to this ED here with abdominal pain, nausea, vomiting.  Suspect alcoholic gastritis in the setting of recent relapse.  Lab work shows likely mild alcoholic ketoacidosis and dehydration but is otherwise reassuring.  CT scan obtained given his reported degree of discomfort, and shows no evidence of acute emergent pathology.   FINAL CLINICAL IMPRESSION(S) / ED DIAGNOSES   Final diagnoses:  Alcohol abuse  Dehydration  Nausea and vomiting, unspecified vomiting type     Rx / DC Orders   ED Discharge Orders     None        Note:  This document was prepared using Dragon voice recognition software and may include unintentional dictation errors.   Shaune Pollack, MD 10/12/23 2108

## 2023-10-12 NOTE — ED Triage Notes (Signed)
Pt to ED [facility] from home for emesis started this am. Reports drinking wine the past 4-5 days and decreased eating. Last drink this am. VSS with ems Reports h/a and abd pain

## 2023-10-12 NOTE — ED Notes (Signed)
Pt given Gram crackers with water. Tolerated well

## 2023-10-21 ENCOUNTER — Emergency Department: Payer: MEDICAID

## 2023-10-21 ENCOUNTER — Emergency Department
Admission: EM | Admit: 2023-10-21 | Discharge: 2023-10-22 | Disposition: A | Discharge status: Home / Self Care | Payer: MEDICAID | Attending: Emergency Medicine | Admitting: Emergency Medicine

## 2023-10-21 ENCOUNTER — Other Ambulatory Visit: Payer: Self-pay

## 2023-10-21 DIAGNOSIS — Y908 Blood alcohol level of 240 mg/100 ml or more: Secondary | ICD-10-CM | POA: Insufficient documentation

## 2023-10-21 DIAGNOSIS — W19XXXA Unspecified fall, initial encounter: Secondary | ICD-10-CM | POA: Diagnosis not present

## 2023-10-21 DIAGNOSIS — S80812A Abrasion, left lower leg, initial encounter: Secondary | ICD-10-CM | POA: Insufficient documentation

## 2023-10-21 DIAGNOSIS — W01198A Fall on same level from slipping, tripping and stumbling with subsequent striking against other object, initial encounter: Secondary | ICD-10-CM | POA: Diagnosis not present

## 2023-10-21 DIAGNOSIS — F1010 Alcohol abuse, uncomplicated: Secondary | ICD-10-CM | POA: Diagnosis not present

## 2023-10-21 DIAGNOSIS — F10920 Alcohol use, unspecified with intoxication, uncomplicated: Secondary | ICD-10-CM | POA: Insufficient documentation

## 2023-10-21 LAB — CBC WITH DIFFERENTIAL/PLATELET
Abs Immature Granulocytes: 0.08 10*3/uL — ABNORMAL HIGH (ref 0.00–0.07)
Basophils Absolute: 0.1 10*3/uL (ref 0.0–0.1)
Basophils Relative: 1 %
Eosinophils Absolute: 0.1 10*3/uL (ref 0.0–0.5)
Eosinophils Relative: 1 %
HCT: 40 % (ref 39.0–52.0)
Hemoglobin: 12.7 g/dL — ABNORMAL LOW (ref 13.0–17.0)
Immature Granulocytes: 1 %
Lymphocytes Relative: 16 %
Lymphs Abs: 1.7 10*3/uL (ref 0.7–4.0)
MCH: 27 pg (ref 26.0–34.0)
MCHC: 31.8 g/dL (ref 30.0–36.0)
MCV: 84.9 fL (ref 80.0–100.0)
Monocytes Absolute: 1 10*3/uL (ref 0.1–1.0)
Monocytes Relative: 9 %
Neutro Abs: 7.9 10*3/uL — ABNORMAL HIGH (ref 1.7–7.7)
Neutrophils Relative %: 72 %
Platelets: 154 10*3/uL (ref 150–400)
RBC: 4.71 MIL/uL (ref 4.22–5.81)
RDW: 20.3 % — ABNORMAL HIGH (ref 11.5–15.5)
WBC: 10.8 10*3/uL — ABNORMAL HIGH (ref 4.0–10.5)
nRBC: 0 % (ref 0.0–0.2)

## 2023-10-21 LAB — URINE DRUG SCREEN, QUALITATIVE (ARMC ONLY)
Amphetamines, Ur Screen: NOT DETECTED
Barbiturates, Ur Screen: NOT DETECTED
Benzodiazepine, Ur Scrn: NOT DETECTED
Cannabinoid 50 Ng, Ur ~~LOC~~: POSITIVE — AB
Cocaine Metabolite,Ur ~~LOC~~: NOT DETECTED
MDMA (Ecstasy)Ur Screen: NOT DETECTED
Methadone Scn, Ur: NOT DETECTED
Opiate, Ur Screen: NOT DETECTED
Phencyclidine (PCP) Ur S: NOT DETECTED
Tricyclic, Ur Screen: NOT DETECTED

## 2023-10-21 LAB — COMPREHENSIVE METABOLIC PANEL
ALT: 16 U/L (ref 0–44)
AST: 13 U/L — ABNORMAL LOW (ref 15–41)
Albumin: 4.5 g/dL (ref 3.5–5.0)
Alkaline Phosphatase: 65 U/L (ref 38–126)
Anion gap: 9 (ref 5–15)
BUN: 29 mg/dL — ABNORMAL HIGH (ref 8–23)
CO2: 22 mmol/L (ref 22–32)
Calcium: 9 mg/dL (ref 8.9–10.3)
Chloride: 100 mmol/L (ref 98–111)
Creatinine, Ser: 1.34 mg/dL — ABNORMAL HIGH (ref 0.61–1.24)
GFR, Estimated: 59 mL/min — ABNORMAL LOW (ref >60)
Glucose, Bld: 104 mg/dL — ABNORMAL HIGH (ref 70–99)
Potassium: 5 mmol/L (ref 3.5–5.1)
Sodium: 131 mmol/L — ABNORMAL LOW (ref 135–145)
Total Bilirubin: 0.7 mg/dL (ref <1.2)
Total Protein: 7.5 g/dL (ref 6.5–8.1)

## 2023-10-21 LAB — SALICYLATE LEVEL: Salicylate Lvl: <7 mg/dL — ABNORMAL LOW (ref 7.0–30.0)

## 2023-10-21 LAB — ACETAMINOPHEN LEVEL: Acetaminophen (Tylenol), Serum: <10 ug/mL — ABNORMAL LOW (ref 10–30)

## 2023-10-21 LAB — ETHANOL: Alcohol, Ethyl (B): 342 mg/dL (ref <10)

## 2023-10-21 MED ORDER — HYDROXYZINE HCL 25 MG PO TABS
25.0000 mg | ORAL_TABLET | Freq: Four times a day (QID) | ORAL | Status: DC | PRN
  Filled 2023-10-21: qty 1

## 2023-10-21 MED ORDER — THIAMINE HCL 100 MG/ML IJ SOLN
100.0000 mg | Freq: Once | INTRAMUSCULAR | Status: AC
  Administered 2023-10-21: 100 mg via INTRAMUSCULAR
  Filled 2023-10-21: qty 2

## 2023-10-21 MED ORDER — ADULT MULTIVITAMIN W/MINERALS CH
1.0000 | ORAL_TABLET | Freq: Every day | ORAL | Status: DC
Start: 2023-10-22 — End: 2023-10-22
  Administered 2023-10-22: 1 via ORAL
  Filled 2023-10-21: qty 1

## 2023-10-21 MED ORDER — LORAZEPAM 0.5 MG PO TABS
0.5000 mg | ORAL_TABLET | Freq: Once | ORAL | Status: AC
  Administered 2023-10-21: 0.5 mg via ORAL
  Filled 2023-10-21: qty 1

## 2023-10-21 MED ORDER — HYDROXYZINE HCL 25 MG PO TABS
25.0000 mg | ORAL_TABLET | Freq: Once | ORAL | Status: AC
  Administered 2023-10-21: 25 mg via ORAL
  Filled 2023-10-21: qty 1

## 2023-10-21 MED ORDER — THIAMINE MONONITRATE 100 MG PO TABS
100.0000 mg | ORAL_TABLET | Freq: Every day | ORAL | Status: DC
  Administered 2023-10-22: 100 mg via ORAL
  Filled 2023-10-21: qty 1

## 2023-10-21 MED ORDER — ONDANSETRON 4 MG PO TBDP: 4.0000 mg | ORAL_TABLET | Freq: Four times a day (QID) | ORAL | Status: DC | PRN

## 2023-10-21 MED ORDER — LOPERAMIDE HCL 2 MG PO CAPS: 2.0000 mg | ORAL_CAPSULE | ORAL | Status: DC | PRN

## 2023-10-21 MED ORDER — LORAZEPAM 1 MG PO TABS
1.0000 mg | ORAL_TABLET | Freq: Four times a day (QID) | ORAL | Status: DC | PRN
  Administered 2023-10-21: 1 mg via ORAL
  Filled 2023-10-21: qty 1

## 2023-10-21 NOTE — ED Provider Notes (Signed)
[facility] Provider Note    Event Date/Time   First MD Initiated Contact with Patient 10/21/23 1825     (approximate)   History   No chief complaint on file.   HPI  Miguel [name] is a 62 y.o. male with history of alcohol abuse who comes in with concerns for SI.  Patient has been here multiple times for alcohol abuse he continues to drink daily.  His wife had called [facility] and originally he had agreed to go with police there but then he ended up refusing and then reported some SI so patient was brought here.  He currently denies any SI.  Does report a fall yesterday where he thinks he might of hit his head.   Tdap 2020    Physical Exam   Triage Vital Signs: ED Triage Vitals  Encounter Vitals Group     BP      Systolic BP Percentile      Diastolic BP Percentile      Pulse      Resp      Temp      Temp src      SpO2      Weight      Height      Head Circumference      Peak Flow      Pain Score      Pain Loc      Pain Education      Exclude from Growth Chart     Most recent vital signs: Vitals:   10/21/23 1825 10/21/23 2024  BP:  120/66  Pulse:  (!) 127  Resp:  19  Temp:  (!) 97.5 F (36.4 C)  SpO2: 98% 100%     General: Awake, no distress.  CV:  Good peripheral perfusion.  Resp:  Normal effort.  Abd:  No distention.  Nontender Other:  Patient has a little bit of abrasion to his left leg. ED Results / Procedures / Treatments   Labs (all labs ordered are listed, but only abnormal results are displayed) Labs Reviewed  COMPREHENSIVE METABOLIC PANEL  ETHANOL  URINE DRUG SCREEN, QUALITATIVE ([facility] ONLY)  CBC WITH DIFFERENTIAL/PLATELET     RADIOLOGY I have reviewed the CT head personally and interpreted and no ICH   PROCEDURES:  Critical Care performed: No  Procedures   MEDICATIONS ORDERED IN ED: Medications - No data to display   IMPRESSION / MDM / ASSESSMENT AND PLAN / ED COURSE  I reviewed the triage vital signs  and the nursing notes.   Patient's presentation is most consistent with acute presentation with potential threat to life or bodily function.    Patient comes in under the influence of alcohol with concerns for SI.  Patient currently denying SI.  Patient also had a fall.  Will get imaging to evaluate for intracranial hemorrhage, cervical fracture.  Up-to-date on tetanus. Ambulatory with EMS no other pain per patient.    Pt is without any acute medical complaints. No exam findings to suggest medical cause of current presentation. Will order psychiatric screening labs and discuss further w/ psychiatric service.  D/d includes but is not limited to psychiatric disease, behavioral/personality disorder, inadequate socioeconomic support, medical.  Based on HPI, exam, unremarkable labs, no concern for acute medical problem at this time. No rigidity, clonus, hyperthermia, focal neurologic deficit, diaphoresis, tachycardia, meningismus, ataxia, gait abnormality or other finding to suggest this visit represents a non-psychiatric problem. Screening labs reviewed.    Given  this, pt medically cleared, to be dispositioned per Psych.  EKG my interpretation is normal sinus rate of 85 without any ST elevation or T wave inversions, normal intervals  Tylenol negative.  Labs show slight decrease sodium and increased creatinine but improving from 8 days ago.  Will encourage p.o. hydration.  Alcohol level is significantly elevated.  Patient was getting a little bit agitated and so patient was given 0.5 of Ativan.  The patient has been placed in psychiatric observation due to the need to provide a safe environment for the patient while obtaining psychiatric consultation and evaluation, as well as ongoing medical and medication management to treat the patient's condition.  The patient has not been placed under full IVC at this time.      FINAL CLINICAL IMPRESSION(S) / ED DIAGNOSES   Final diagnoses:  Fall,  initial encounter  Alcoholic intoxication without complication (HCC)     Rx / DC Orders   ED Discharge Orders     None        Note:  This document was prepared using Dragon voice recognition software and may include unintentional dictation errors.   Concha Se, MD 10/21/23 [phone]

## 2023-10-21 NOTE — ED Notes (Signed)
DRESS OUT: [place] [employer] Psychologist, [facility] and probation services

## 2023-10-21 NOTE — ED Triage Notes (Signed)
Pt to ED from [facility] from home. Pt wife had called EMS due to concerns of pt safety due to ETOH intoxication and SI. Pt tearfully denies SI currentl. Pt states he does have a generalized headache; reports falling last night and hitting the back of his head on the floor. Denies loc. Pt alert and talkative during triage. No distress noted.

## 2023-10-21 NOTE — Consult Note (Signed)
Telepsych Consultation   Reason for Consult:  Psych Evaluation Referring Physician:  Dr. Fuller Plan Location of Patient: [facility], [facility], [facility] Location of Provider: Other: Remote office  Patient Identification: Miguel [name] MRN:  [id number] Principal Diagnosis: Alcohol abuse Diagnosis:  Principal Problem:   Alcohol abuse   Total Time spent with patient: 30 minutes  Subjective:  "I dont feel good"     HPI:  Tele psych Assessment  Miguel [name], 62 y.o., male patient seen via tele health by TTS and this provider; chart reviewed and consulted with Dr. Fuller Plan on 10/21/23.  On evaluation Miguel [name] reports that he doesn't feel good mentally or physically. He admits to drinking today. He says he drank wine today but doesn't recall how much.  He is having marital problems as a result of his excessive drinking.  He denies SI/HI, and states, " I would never hurt myself or anyone else".  He doesn't believe he has a drinking problem because he says only drinks in the house. At this time he does not want to stop drinking because it calms him down.  Patient was offered medical detox hospitalization to assist with possible withdrawal complications, but he declined stating that he has to take care of his wife.  Patient has been psych cleared at this time as he poses no danger to self or others.  However, for safety issues it is recommended that he remains in the hospital until sober and medically stable. Current BAL is  342.  During evaluation Miguel [name] is laying in the hospital bed; he is alert/oriented x 4; depressed/anxious and jittery (most likely ETOH withdrawal effects) cooperative; and mood congruent with affect.  Patient is speaking in a muffled tone at moderate volume, and normal pace; with good eye contact.  his thought process is coherent and relevant; There is no indication that he is currently responding to internal/external stimuli or experiencing delusional thought content.  Patient denies  suicidal/self-harm/homicidal ideation, psychosis, and paranoia.  Patient has remained calm throughout assessment and has answered questions appropriately.      Recommendations: Patient is psych cleared and may be discharged once medically cleared.  Dr. 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    OCD (obsessive compulsive disorder)    Renal colic     Past Surgical History:  Procedure Laterality Date   CHOLECYSTECTOMY  2012   COLONOSCOPY     COLONOSCOPY N/A 12/24/2022   Procedure: COLONOSCOPY;  Surgeon: Regis Bill, MD;  Location: [facility] ENDOSCOPY;  Service: Endoscopy;  Laterality: N/A;   COLONOSCOPY WITH PROPOFOL N/A 05/28/2020   Procedure: COLONOSCOPY WITH PROPOFOL;  Surgeon: Wyline Mood, MD;  Location[facility] ENDOSCOPY;  Service: Gastroenterology;  Laterality: N/A;   ESOPHAGOGASTRODUODENOSCOPY N/A 12/24/2022   Procedure: ESOPHAGOGASTRODUODENOSCOPY (EGD);  Surgeon: Regis Bill, MD;  Location: [facility] ENDOSCOPY;  Service: Endoscopy;  Laterality: N/A;   EXTRACORPOREAL SHOCK WAVE LITHOTRIPSY Left 01/12/2019  Procedure: EXTRACORPOREAL SHOCK WAVE LITHOTRIPSY (ESWL);  Surgeon: Sondra Come, MD;  Location: [facility] ORS;  Service: Urology;  Laterality: Left;   UPPER GI ENDOSCOPY     Family History:  Family History  Problem Relation Age of Onset   Alcohol abuse Father    Breast cancer Mother 15   Anxiety disorder Brother    Other Maternal Grandmother        unknown medical history   Other Maternal Grandfather         unknow medical history   Other Paternal Grandmother        unknown medical history   Other Paternal Grandfather        unknown medical history   Healthy Brother    Family Psychiatric  History: unknown Social History:  Social History   Substance and Sexual Activity  Alcohol Use Yes   Alcohol/week: 24.0 standard drinks of alcohol   Types: 24 Shots of liquor per week   Comment: lasst used 8 days ago     Social History   Substance and Sexual Activity  Drug Use Not Currently   Types: Marijuana   Comment: last use 09/2021    Social History   Socioeconomic History   Marital status: Married    Spouse name: Not on file   Number of children: Not on file   Years of education: Not on file   Highest education level: Not on file  Occupational History   Not on file  Tobacco Use   Smoking status: Former    Current packs/day: 0.00    Types: Cigarettes    Quit date: 03/19/1981    Years since quitting: 42.6   Smokeless tobacco: Never  Vaping Use   Vaping status: Never Used  Substance and Sexual Activity   Alcohol use: Yes    Alcohol/week: 24.0 standard drinks of alcohol    Types: 24 Shots of liquor per week    Comment: lasst used 8 days ago   Drug use: Not Currently    Types: Marijuana    Comment: last use 09/2021   Sexual activity: Not Currently  Other Topics Concern   Not on file  Social History Narrative   Lives at home with his wife and takes care of his wife. Independent at baseline      - Biggest strain is financial but doesn't think they could got get more help.      Patient expressed interest in possible financial assistance. Informed written consent obtained   Social Determinants of Health   Financial Resource Strain: High Risk (09/18/2020)   Overall Financial Resource Strain (CARDIA)    Difficulty of Paying Living Expenses: Very hard  Food Insecurity: No Food Insecurity (05/24/2023)   Hunger Vital Sign    Worried About Running Out of Food in the Last Year: Never  true    Ran Out of Food in the Last Year: Never true  Transportation Needs: No Transportation Needs (05/24/2023)   PRAPARE - Administrator, Civil Service (Medical): No    Lack of Transportation (Non-Medical): No  Physical Activity: Sufficiently Active (09/18/2020)   Exercise Vital Sign    Days of Exercise per Week: 7 days    Minutes of Exercise per Session: 60 min  Stress: Stress Concern Present (09/18/2020)   [employer] of Occupational Health - Occupational Stress Questionnaire    Feeling of Stress : Very much  Social Connections: Socially Isolated (09/18/2020)   Social Connection and Isolation Panel [NHANES]  Frequency of Communication with Friends and Family: Once a week    Frequency of Social Gatherings with Friends and Family: Once a week    Attends Religious Services: Never    Database administrator or Organizations: No    Attends Banker Meetings: Never    Marital Status: Married   Additional Social History:    Allergies:  No Known Allergies  Labs:  Results for orders placed or performed during the hospital encounter of 10/21/23 (from the past 48 hour(s))  Comprehensive metabolic panel     Status: Abnormal   Collection Time: 10/21/23  7:03 PM  Result Value Ref Range   Sodium 131 (L) 135 - 145 mmol/L   Potassium 5.0 3.5 - 5.1 mmol/L   Chloride 100 98 - 111 mmol/L   CO2 22 22 - 32 mmol/L   Glucose, Bld 104 (H) 70 - 99 mg/dL    Comment: Glucose reference range applies only to samples taken after fasting for at least 8 hours.   BUN 29 (H) 8 - 23 mg/dL   Creatinine, Ser 1.61 (H) 0.61 - 1.24 mg/dL   Calcium 9.0 8.9 - 09.6 mg/dL   Total Protein 7.5 6.5 - 8.1 g/dL   Albumin 4.5 3.5 - 5.0 g/dL   AST 13 (L) 15 - 41 U/L   ALT 16 0 - 44 U/L   Alkaline Phosphatase 65 38 - 126 U/L   Total Bilirubin 0.7 <1.2 mg/dL   GFR, Estimated 59 (L) >60 mL/min    Comment: (NOTE) Calculated using the CKD-EPI Creatinine Equation (2021)    Anion gap 9 5 - 15     Comment: Performed at [facility], [address] [place], [place] [address]  Ethanol     Status: Abnormal   Collection Time: 10/21/23  7:03 PM  Result Value Ref Range   Alcohol, Ethyl (B) 342 (HH) <10 mg/dL    Comment: CRITICAL RESULT CALLED TO, READ BACK BY AND VERIFIED WITH JESSICA STALEY AT 2046 ON 10/21/23 BY SS (NOTE) Lowest detectable limit for serum alcohol is 10 mg/dL.  For medical purposes only. Performed at [facility], [address] [place], [place] [address]   Urine Drug Screen, Qualitative     Status: Abnormal   Collection Time: 10/21/23  7:03 PM  Result Value Ref Range   Tricyclic, Ur Screen NONE DETECTED NONE DETECTED   Amphetamines, Ur Screen NONE DETECTED NONE DETECTED   MDMA (Ecstasy)Ur Screen NONE DETECTED NONE DETECTED   Cocaine Metabolite,Ur [place] NONE DETECTED NONE DETECTED   Opiate, Ur Screen NONE DETECTED NONE DETECTED   Phencyclidine (PCP) Ur S NONE DETECTED NONE DETECTED   Cannabinoid 50 Ng, Ur [place] POSITIVE (A) NONE DETECTED   Barbiturates, Ur Screen NONE DETECTED NONE DETECTED   Benzodiazepine, Ur Scrn NONE DETECTED NONE DETECTED   Methadone Scn, Ur NONE DETECTED NONE DETECTED    Comment: (NOTE) Tricyclics + metabolites, urine    Cutoff 1000 ng/mL Amphetamines + metabolites, urine  Cutoff 1000 ng/mL MDMA (Ecstasy), urine              Cutoff 500 ng/mL Cocaine Metabolite, urine          Cutoff 300 ng/mL Opiate + metabolites, urine        Cutoff 300 ng/mL Phencyclidine (PCP), urine         Cutoff 25 ng/mL Cannabinoid, urine                 Cutoff 50 ng/mL  Barbiturates + metabolites, urine  Cutoff 200 ng/mL Benzodiazepine, urine              Cutoff 200 ng/mL Methadone, urine                   Cutoff 300 ng/mL  The urine drug screen provides only a preliminary, unconfirmed analytical test result and should not be used for non-medical purposes. Clinical consideration and professional judgment should be applied to any positive  drug screen result due to possible interfering substances. A more specific alternate chemical method must be used in order to obtain a confirmed analytical result. Gas chromatography / mass spectrometry (GC/MS) is the preferred confirm atory method. Performed at [facility], [address] [place], [place] [address]   CBC with Diff     Status: Abnormal   Collection Time: 10/21/23  7:03 PM  Result Value Ref Range   WBC 10.8 (H) 4.0 - 10.5 K/uL   RBC 4.71 4.22 - 5.81 MIL/uL   Hemoglobin 12.7 (L) 13.0 - 17.0 g/dL   HCT 78.4 69.6 - 29.5 %   MCV 84.9 80.0 - 100.0 fL   MCH 27.0 26.0 - 34.0 pg   MCHC 31.8 30.0 - 36.0 g/dL   RDW 28.4 (H) 13.2 - 44.0 %   Platelets 154 150 - 400 K/uL   nRBC 0.0 0.0 - 0.2 %   Neutrophils Relative % 72 %   Neutro Abs 7.9 (H) 1.7 - 7.7 K/uL   Lymphocytes Relative 16 %   Lymphs Abs 1.7 0.7 - 4.0 K/uL   Monocytes Relative 9 %   Monocytes Absolute 1.0 0.1 - 1.0 K/uL   Eosinophils Relative 1 %   Eosinophils Absolute 0.1 0.0 - 0.5 K/uL   Basophils Relative 1 %   Basophils Absolute 0.1 0.0 - 0.1 K/uL   Immature Granulocytes 1 %   Abs Immature Granulocytes 0.08 (H) 0.00 - 0.07 K/uL    Comment: Performed at [facility], [address] [place], [place] [address]  Salicylate level     Status: Abnormal   Collection Time: 10/21/23  7:03 PM  Result Value Ref Range   Salicylate Lvl <7.0 (L) 7.0 - 30.0 mg/dL    Comment: Performed at [facility], [address] [place], [place] [address]  Acetaminophen level     Status: Abnormal   Collection Time: 10/21/23  7:03 PM  Result Value Ref Range   Acetaminophen (Tylenol), Serum <10 (L) 10 - 30 ug/mL    Comment: (NOTE) Therapeutic concentrations vary significantly. A range of 10-30 ug/mL  may be an effective concentration for many patients. However, some  are best treated at concentrations outside of this range. Acetaminophen concentrations >150 ug/mL at 4 hours after ingestion  and >50  ug/mL at 12 hours after ingestion are often associated with  toxic reactions.  Performed at [facility], [address] [place], [place] [address]     Medications:  Current Facility-Administered Medications  Medication Dose Route Frequency Provider Last Rate Last Admin   hydrOXYzine (ATARAX) tablet 25 mg  25 mg Oral Q6H PRN Jearld Lesch, NP       loperamide (IMODIUM) capsule 2-4 mg  2-4 mg Oral PRN Jearld Lesch, NP       LORazepam (ATIVAN) tablet 1 mg  1 mg Oral Q6H PRN Jearld Lesch, NP   1 mg at 10/21/23 2246   [START ON 10/22/2023] multivitamin with minerals tablet 1 tablet  1  tablet Oral Daily Durwin Nora, Jenah Vanasten M, NP       ondansetron (ZOFRAN-ODT) disintegrating tablet 4 mg  4 mg Oral Q6H PRN Jearld Lesch, NP       [START ON 10/22/2023] thiamine (VITAMIN B1) tablet 100 mg  100 mg Oral Daily Jearld Lesch, NP       Current Outpatient Medications  Medication Sig Dispense Refill   albuterol (PROAIR HFA) 108 (90 Base) MCG/ACT inhaler Inhale 1-2 puffs into the lungs every 4 (four) hours as needed for wheezing or shortness of breath. 18 g 11   albuterol (VENTOLIN HFA) 108 (90 Base) MCG/ACT inhaler Inhale 2 puffs into the lungs every 4 (four) hours as needed for wheezing or shortness of breath. 18 g 1   allopurinol (ZYLOPRIM) 100 MG tablet Take 1 tablet (100 mg total) by mouth daily. 90 tablet 3   colchicine 0.6 MG tablet Take 1 tablet (0.6 mg total) by mouth 2 (two) times daily. 60 tablet 0   diclofenac Sodium (VOLTAREN) 1 % GEL Apply 4 g topically 3 (three) times daily as needed (left chest wall pain). 100 g 0   ferrous sulfate 325 (65 FE) MG tablet Take 1 tablet (325 mg total) by mouth daily with breakfast. 30 tablet 1   fluticasone-salmeterol (ADVAIR) 100-50 MCG/ACT AEPB Inhale 1 puff into the lungs 2 (two) times daily. 60 each 11   folic acid (FOLVITE) 1 MG tablet Take 1 tablet (1 mg total) by mouth daily. 30 tablet 1   HYDROcodone-acetaminophen (NORCO/VICODIN)  5-325 MG tablet Take 1-2 tablets by mouth every 6 (six) hours as needed for moderate pain or severe pain. (Patient not taking: Reported on 06/18/2023) 20 tablet 0   lisinopril (ZESTRIL) 40 MG tablet TAKE ONE TABLET BY MOUTH ONCE EVERY DAY. 90 tablet 3   methylPREDNISolone (MEDROL DOSEPAK) 4 MG TBPK tablet 6 day taper; take as directed on package instructions 21 tablet 0   metoprolol succinate (TOPROL XL) 25 MG 24 hr tablet Take 1 tablet (25 mg total) by mouth daily. 90 tablet 3   Multiple Vitamin (MULTIVITAMIN WITH MINERALS) TABS tablet Take 1 tablet by mouth daily.     naltrexone (DEPADE) 50 MG tablet Take 1 tablet (50 mg total) by mouth daily. 90 tablet 0   sertraline (ZOLOFT) 50 MG tablet Take 1 tablet (50 mg total) by mouth daily. 90 tablet 3   thiamine (VITAMIN B-1) 100 MG tablet Take 1 tablet (100 mg total) by mouth daily. 30 tablet 1   traZODone (DESYREL) 100 MG tablet Take 1 tablet (100 mg total) by mouth at bedtime as needed. 90 tablet 3    Musculoskeletal: Strength & Muscle Tone: within normal limits Gait & Station: normal Patient leans: N/A  Psychiatric Specialty Exam:  Presentation  General Appearance:  Casual  Eye Contact: Fair  Speech: Slow; Slurred  Speech Volume: Increased  Handedness:Right   Mood and Affect  Mood: Anxious  Affect: Appropriate   Thought Process  Thought Processes: Coherent  Descriptions of Associations:Intact  Orientation:Full (Time, Place and Person)  Thought Content:WDL  History of Schizophrenia/Schizoaffective disorder:No data recorded Duration of Psychotic Symptoms:No data recorded Hallucinations:Hallucinations: None  Ideas of Reference:None  Suicidal Thoughts:Suicidal Thoughts: No  Homicidal Thoughts:Homicidal Thoughts: No   Sensorium  Memory: Recent Good; Remote Fair  Judgment: Fair  Insight: Fair   Art therapist  Concentration: Fair  Attention Span: Fair  Recall: Fair  Fund of  Knowledge: Fair  Language: Fair   Psychomotor Activity  Psychomotor Activity:Psychomotor Activity:  Normal   Assets  Assets: Manufacturing systems engineer; Financial Resources/Insurance; Housing; Social Support; Vocational/Educational   Sleep  Sleep:Sleep: Fair    Physical Exam: Physical Exam Vitals and nursing note reviewed.  HENT:     Head: Normocephalic and atraumatic.     Nose: Nose normal.     Mouth/Throat:     Mouth: Mucous membranes are moist.  Eyes:     Pupils: Pupils are equal, round, and reactive to light.  Pulmonary:     Effort: Pulmonary effort is normal.  Musculoskeletal:        General: Normal range of motion.     Cervical back: Normal range of motion.  Skin:    General: Skin is warm.  Neurological:     Mental Status: He is alert and oriented to person, place, and time.  Psychiatric:        Attention and Perception: Attention and perception normal.        Mood and Affect: Mood is anxious.        Speech: Speech is slurred.        Behavior: Behavior is cooperative.        Thought Content: Thought content normal. Thought content does not include homicidal or suicidal ideation. Thought content does not include homicidal or suicidal plan.        Cognition and Memory: Cognition is impaired.        Judgment: Judgment is impulsive.    Review of Systems  Psychiatric/Behavioral:  Positive for substance abuse. Negative for hallucinations and suicidal ideas. The patient is not nervous/anxious.   All other systems reviewed and are negative.  Blood pressure 120/66, pulse (!) 111, temperature (!) 97.5 F (36.4 C), temperature source Oral, resp. rate 19, height 6' (1.829 m), weight 86.2 kg, SpO2 100%. Body mass index is 25.77 kg/m.  Treatment Plan Summary: Medication management and Plan  Miguel [name] was admitted to [facility] ER for Alcohol abuse, crisis management, and stabilization. Routine labs ordered, which include Lab Orders         Comprehensive metabolic panel          Ethanol         Urine Drug Screen, Qualitative         CBC with Diff         Salicylate level         Acetaminophen level    Medication Management: Medications started  [START ON 10/22/2023] multivitamin with minerals  1 tablet Oral Daily   [START ON 10/22/2023] thiamine  100 mg Oral Daily   Will maintain observation checks every 15 minutes for safety. Psychosocial education regarding relapse prevention and self-care; social and communication  Social work will consult with family for collateral information and discuss discharge and follow up plan.  Disposition: No evidence of imminent risk to self or others at present.   Patient does not meet criteria for psychiatric inpatient admission. Refer to IOP. Discussed crisis plan, support from social network, calling 911, coming to the Emergency Department, and calling Suicide Hotline.  This service was provided via telemedicine using a 2-way, interactive audio and video technology.  Jearld Lesch, NP 10/21/2023 11:36 PM

## 2023-10-21 NOTE — ED Notes (Signed)
Pt given meal tray and beverage.

## 2023-10-22 MED ORDER — SALINE SPRAY 0.65 % NA SOLN
1.0000 | Freq: Once | NASAL | Status: DC
  Filled 2023-10-22: qty 44

## 2023-10-22 NOTE — ED Notes (Signed)
Cab voucher provided by social work at this time. All belongings returned to patient at time of discharge. Patient escorted to lobby by this RN. Informed first nurse patient has cab voucher. Patient alert and oriented x4 at time of discharge, steady gait. Patient denies SI/HI, AH/VH.

## 2023-10-22 NOTE — BH Assessment (Signed)
Comprehensive Clinical Assessment (CCA) Note  10/22/2023 Miguel [name] [id number]  Chief Complaint: Patient is a 62 year old male presenting to [facility] ED voluntarily. Per triage note Pt to ED from [facility] from home. Pt wife had called EMS due to concerns of pt safety due to ETOH intoxication and SI. Pt tearfully denies SI currentl. Pt states he does have a generalized headache; reports falling last night and hitting the back of his head on the floor. Denies loc. Pt alert and talkative during triage. No distress noted. During assessment patient appears alert and oriented x4, calm and cooperative, under the influence of substances who reports "I [name] feel good physically, I feel weak, I can't walk to the bathroom, I take care of my wife, I do what I can for both of [name], I feel like I'm not doing enough." "My wife has MS, she can't get up and out her chair, she's mad at me." "She hates that I drink, I've been cutting back slowly, I used to drink vodka but now I drink wine." Patient denies SI/HI/AH/VH.  Per Psyc NP Lerry Liner patient is psyc cleared Chief Complaint  Patient presents with   Mental Health Problem   Visit Diagnosis: Alcohol use disorder, severe    CCA Screening, Triage and Referral (STR)  Patient Reported Information How did you hear about [name]? Self  Referral name: No data recorded Referral phone number: No data recorded  Whom do you see for routine medical problems? No data recorded Practice/Facility Name: No data recorded Practice/Facility Phone Number: No data recorded Name of Contact: No data recorded Contact Number: No data recorded Contact Fax Number: No data recorded Prescriber Name: No data recorded Prescriber Address (if known): No data recorded  What Is the Reason for Your Visit/Call Today? Pt to ED from [facility] from home. Pt wife had called EMS due to concerns of pt safety due to ETOH intoxication and SI. Pt tearfully denies SI currentl. Pt states he does have a  generalized headache; reports falling last night and hitting the back of his head on the floor. Denies loc. Pt alert and talkative during triage. No distress noted.  How Long Has This Been Causing You Problems? > than 6 months  What Do You Feel Would Help You the Most Today? Stress Management   Have You Recently Been in Any Inpatient Treatment (Hospital/Detox/Crisis Center/28-Day Program)? No data recorded Name/Location of Program/Hospital:No data recorded How Long Were You There? No data recorded When Were You Discharged? No data recorded  Have You Ever Received Services From [facility] Before? No data recorded Who Do You See at [facility]? No data recorded  Have You Recently Had Any Thoughts About Hurting Yourself? No  Are You Planning to Commit Suicide/Harm Yourself At This time? No   Have you Recently Had Thoughts About Hurting Someone [name]? No  Explanation: n/a   Have You Used Any Alcohol or Drugs in the Past 24 Hours? Yes  How Long Ago Did You Use Drugs or Alcohol? No data recorded What Did You Use and How Much? ETHOH; 2 shots   Do You Currently Have a Therapist/Psychiatrist? Yes  Name of Therapist/Psychiatrist: [facility]   Have You Been Recently Discharged From Any Office Practice or Programs? No  Explanation of Discharge From Practice/Program: n/a     CCA Screening Triage Referral Assessment Type of Contact: Face-to-Face  Is this Initial or Reassessment? No data recorded Date Telepsych consult ordered in [facility]:  No data recorded Time Telepsych consult  ordered in [facility]:  No data recorded  Patient Reported Information Reviewed? No data recorded Patient Left Without Being Seen? No data recorded Reason for Not Completing Assessment: No data recorded  Collateral Involvement: None provided   Does Patient Have a Court Appointed Legal Guardian? No data recorded Name and Contact of Legal Guardian: No data recorded If Minor and Not Living with Parent(s), Who has  Custody? n/a  Is CPS involved or ever been involved? Never  Is APS involved or ever been involved? Never   Patient Determined To Be At Risk for Harm To Self or Others Based on Review of Patient Reported Information or Presenting Complaint? No  Method: No Plan  Availability of Means: No access or NA  Intent: Vague intent or NA  Notification Required: No need or identified person  Additional Information for Danger to Others Potential: -- (n/a)  Additional Comments for Danger to Others Potential: n/a  Are There Guns or Other Weapons in Your Home? No  Types of Guns/Weapons: n/a  Are These Weapons Safely Secured?                            -- (n/a)  Who Could Verify You Are Able To Have These Secured: n/a  Do You Have any Outstanding Charges, Pending Court Dates, Parole/Probation? None reported  Contacted To Inform of Risk of Harm To Self or Others: Other: Comment (n/a)   Location of Assessment: [facility] ED   Does Patient Present under Involuntary Commitment? No  IVC Papers Initial File Date: No data recorded  [place] of Residence: [place]   Patient Currently Receiving the Following Services: Medication Management   Determination of Need: Emergent (2 hours)   Options For Referral: ED Visit     CCA Biopsychosocial Intake/Chief Complaint:  No data recorded Current Symptoms/Problems: No data recorded  Patient Reported Schizophrenia/Schizoaffective Diagnosis in Past: No   Strengths: Patient is able to communicate his needs  Preferences: No data recorded Abilities: No data recorded  Type of Services Patient Feels are Needed: No data recorded  Initial Clinical Notes/Concerns: No data recorded  Mental Health Symptoms Depression:   Change in energy/activity; Fatigue; Hopelessness   Duration of Depressive symptoms:  Greater than two weeks   Mania:   None   Anxiety:    Fatigue; Restlessness   Psychosis:   None   Duration of Psychotic symptoms: No  data recorded  Trauma:   None   Obsessions:   None   Compulsions:   None   Inattention:   None   Hyperactivity/Impulsivity:   None   Oppositional/Defiant Behaviors:   None   Emotional Irregularity:   None   Other Mood/Personality Symptoms:  No data recorded   Mental Status Exam Appearance and self-care  Stature:   Average   Weight:   Overweight   Clothing:   Disheveled   Grooming:   Neglected   Cosmetic use:   None   Posture/gait:   Normal   Motor activity:   Not Remarkable   Sensorium  Attention:   Normal   Concentration:   Normal   Orientation:   X5   Recall/memory:   Normal   Affect and Mood  Affect:   Appropriate   Mood:   Anxious; Depressed   Relating  Eye contact:   Normal   Facial expression:   Responsive   Attitude toward examiner:   Cooperative   Thought and Language  Speech flow:  Slurred  Thought content:   Appropriate to Mood and Circumstances   Preoccupation:   None   Hallucinations:   None   Organization:  No data recorded  [employer] of Knowledge:   Fair   Intelligence:   Average   Abstraction:   Normal   Judgement:   Fair   Dance movement psychotherapist:   Adequate   Insight:   Lacking; Poor; Denial   Decision Making:   Impulsive   Social Functioning  Social Maturity:   Impulsive   Social Judgement:   Heedless   Stress  Stressors:   Relationship   Coping Ability:   Contractor Deficits:   None   Supports:   Family     Religion: Religion/Spirituality Are You A Religious Person?: No  Leisure/Recreation: Leisure / Recreation Do You Have Hobbies?: No  Exercise/Diet: Exercise/Diet Do You Exercise?: No Have You Gained or Lost A Significant Amount of Weight in the Past Six Months?: No Do You Follow a Special Diet?: No Do You Have Any Trouble Sleeping?: No   CCA Employment/Education Employment/Work Situation: Employment / Work Situation Employment  Situation: Unemployed Patient's Job has Been Impacted by Current Illness: No Has Patient ever Been in Equities trader?: No  Education: Education Is Patient Currently Attending School?: No Did You Have An Individualized Education Program (IIEP): No Did You Have Any Difficulty At [employer]?: No Patient's Education Has Been Impacted by Current Illness: No   CCA Family/Childhood History Family and Relationship History: Family history Marital status: Married Number of Years Married:  (unknown) What types of issues is patient dealing with in the relationship?: Patient's wife is tired of patient's drinking habits Additional relationship information: None reported Does patient have children?: No  Childhood History:  Childhood History By whom was/is the patient raised?: Both parents Did patient suffer any verbal/emotional/physical/sexual abuse as a child?: No Did patient suffer from severe childhood neglect?: No Has patient ever been sexually abused/assaulted/raped as an adolescent or adult?: No Was the patient ever a victim of a crime or a disaster?: No Witnessed domestic violence?: No Has patient been affected by domestic violence as an adult?: No  Child/Adolescent Assessment:     CCA Substance Use Alcohol/Drug Use: Alcohol / Drug Use Pain Medications: see mar Prescriptions: see mar Over the Counter: see mar History of alcohol / drug use?: Yes Withdrawal Symptoms: Change in blood pressure, Tingling, Tremors, Weakness, Patient aware of relationship between substance abuse and physical/medical complications Substance #1 Name of Substance 1: Alcohol 1 - Age of First Use: unknown 1 - Amount (size/oz): patient is a heavy drinker 1 - Frequency: daily 1 - Last Use / Amount: 10/21/23 1- Route of Use: oral                       ASAM's:  Six Dimensions of Multidimensional Assessment  Dimension 1:  Acute Intoxication and/or Withdrawal Potential:      Dimension 2:  Biomedical  Conditions and Complications:      Dimension 3:  Emotional, Behavioral, or Cognitive Conditions and Complications:     Dimension 4:  Readiness to Change:     Dimension 5:  Relapse, Continued use, or Continued Problem Potential:     Dimension 6:  Recovery/Living Environment:     ASAM Severity Score:    ASAM Recommended Level of Treatment: ASAM Recommended Level of Treatment: Level III Residential Treatment   Substance use Disorder (SUD) Substance Use Disorder (SUD)  Checklist Symptoms of Substance  Use: Continued use despite having a persistent/recurrent physical/psychological problem caused/exacerbated by use, Evidence of withdrawal (Comment), Continued use despite persistent or recurrent social, interpersonal problems, caused or exacerbated by use, Large amounts of time spent to obtain, use or recover from the substance(s), Evidence of tolerance, Presence of craving or strong urge to use, Recurrent use that results in a failure to fulfill major role obligations (work, school, home), Social, occupational, recreational activities given up or reduced due to use, Substance(s) often taken in larger amounts or over longer times than was intended, Persistent desire or unsuccessful efforts to cut down or control use  Recommendations for Services/Supports/Treatments:    DSM5 Diagnoses: Patient Active Problem List   Diagnosis Date Noted   Fall 10/21/2023   Iron deficiency 05/28/2023   Alcohol withdrawal with inpatient treatment (HCC) 05/24/2023   Total bilirubin, elevated 05/24/2023   Traumatic ecchymosis 05/24/2023   Alcoholic ketoacidosis 04/29/2023   Hypokalemia 04/29/2023   Hypocalcemia 04/29/2023   Essential hypertension 04/29/2023   Depression 04/29/2023   Syncope 04/28/2023   Encounter for screening for HIV 04/16/2023   Encounter for hepatitis C screening test for low risk patient 04/16/2023   Establishing care with new doctor, encounter for 04/16/2023   Hyperthyroidism 04/16/2023    Elevated serum glucose 04/16/2023   Mixed hyperlipidemia 04/16/2023   Centrilobular emphysema (HCC) 04/16/2023   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) 04/16/2023   Orthostatic hypotension 11/28/2020   Genetic testing 07/15/2020   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    Hx of colonic polyps    Generalized anxiety disorder 10/14/2019   Alcoholic cirrhosis of liver without ascites (HCC) 06/14/2019   Alcohol abuse 06/14/2019   AKI (acute kidney injury) (HCC) 05/27/2019   Alcoholic intoxication without complication (HCC)    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

## 2023-10-22 NOTE — ED Provider Notes (Signed)
Emergency Medicine Observation Re-evaluation Note  Miguel [name] is a 62 y.o. male, seen on rounds today.  Pt initially presented to the ED for complaints of Mental Health Problem Currently, the patient is resting, voices no medical complaints.  Physical Exam  BP 120/66   Pulse (!) 111   Temp (!) 97.5 F (36.4 C) (Oral)   Resp 19   Ht 6' (1.829 m)   Wt 86.2 kg   SpO2 100%   BMI 25.77 kg/m  Physical Exam General: Resting in no acute distress Cardiac: No cyanosis Lungs: Equal rise and fall Psych: Agitated  ED Course / MDM  EKG:   I have reviewed the labs performed to date as well as medications administered while in observation.  Recent changes in the last 24 hours include no events overnight.  Plan  Current plan is for discharge later this morning.  Patient was cleared by psychiatry.    Irean Hong, MD 10/22/23 [phone]

## 2023-10-22 NOTE — Discharge Instructions (Signed)
Drink alcohol only in moderation. Return to the ER for worsening symptoms, feelings of hurting yourself or others, or other concerns. °

## 2023-10-22 NOTE — ED Notes (Signed)
Patient attempting to call ride home at this time

## 2023-10-22 NOTE — ED Provider Notes (Signed)
Procedures     ----------------------------------------- 9:40 AM on 10/22/2023 ----------------------------------------- Awake and clinically sober, stable for discharge.     Sharman Cheek, MD 10/22/23 [phone]

## 2023-11-14 ENCOUNTER — Other Ambulatory Visit: Payer: Self-pay

## 2023-11-14 ENCOUNTER — Emergency Department: Payer: MEDICAID

## 2023-11-14 DIAGNOSIS — S301XXA Contusion of abdominal wall, initial encounter: Secondary | ICD-10-CM | POA: Diagnosis not present

## 2023-11-14 DIAGNOSIS — S7001XA Contusion of right hip, initial encounter: Secondary | ICD-10-CM | POA: Diagnosis not present

## 2023-11-14 DIAGNOSIS — W010XXA Fall on same level from slipping, tripping and stumbling without subsequent striking against object, initial encounter: Secondary | ICD-10-CM | POA: Insufficient documentation

## 2023-11-14 DIAGNOSIS — S3991XA Unspecified injury of abdomen, initial encounter: Secondary | ICD-10-CM | POA: Diagnosis present

## 2023-11-14 LAB — BASIC METABOLIC PANEL
Anion gap: 17 — ABNORMAL HIGH (ref 5–15)
BUN: 20 mg/dL (ref 8–23)
CO2: 16 mmol/L — ABNORMAL LOW (ref 22–32)
Calcium: 8.6 mg/dL — ABNORMAL LOW (ref 8.9–10.3)
Chloride: 99 mmol/L (ref 98–111)
Creatinine, Ser: 1.4 mg/dL — ABNORMAL HIGH (ref 0.61–1.24)
GFR, Estimated: 56 mL/min — ABNORMAL LOW (ref >60)
Glucose, Bld: 244 mg/dL — ABNORMAL HIGH (ref 70–99)
Potassium: 4.4 mmol/L (ref 3.5–5.1)
Sodium: 132 mmol/L — ABNORMAL LOW (ref 135–145)

## 2023-11-14 LAB — CBC WITH DIFFERENTIAL/PLATELET
Abs Immature Granulocytes: 0.01 10*3/uL (ref 0.00–0.07)
Basophils Absolute: 0 10*3/uL (ref 0.0–0.1)
Basophils Relative: 1 %
Eosinophils Absolute: 0.2 10*3/uL (ref 0.0–0.5)
Eosinophils Relative: 3 %
HCT: 38.4 % — ABNORMAL LOW (ref 39.0–52.0)
Hemoglobin: 12.3 g/dL — ABNORMAL LOW (ref 13.0–17.0)
Immature Granulocytes: 0 %
Lymphocytes Relative: 21 %
Lymphs Abs: 1.4 10*3/uL (ref 0.7–4.0)
MCH: 28.7 pg (ref 26.0–34.0)
MCHC: 32 g/dL (ref 30.0–36.0)
MCV: 89.7 fL (ref 80.0–100.0)
Monocytes Absolute: 0.9 10*3/uL (ref 0.1–1.0)
Monocytes Relative: 13 %
Neutro Abs: 4.3 10*3/uL (ref 1.7–7.7)
Neutrophils Relative %: 62 %
Platelets: 161 10*3/uL (ref 150–400)
RBC: 4.28 MIL/uL (ref 4.22–5.81)
RDW: 20.4 % — ABNORMAL HIGH (ref 11.5–15.5)
WBC: 6.8 10*3/uL (ref 4.0–10.5)
nRBC: 0 % (ref 0.0–0.2)

## 2023-11-14 NOTE — ED Triage Notes (Signed)
Pt slipped walking on his ramp on Thursday. Pt c/o R hip pain, R shoulder, and R neck pain. Pt is not on blood thinners. Bruising present to R hip. Pt was able to get up and get himself into his house after the fall. Pt does not normally use any ambulatory devices.

## 2023-11-14 NOTE — ED Provider Triage Note (Signed)
Emergency Medicine Provider Triage Evaluation Note  Miguel [name] , a 62 y.o. male  was evaluated in triage.  Pt complains of right sided flank pain. Patient was walking up an handicap ramp and fell on Thursday. Patient has had ongoing  pain since.  Review of Systems  Positive: dizziness,  right flank pain, right hip pain Negative:   Physical Exam  There were no vitals taken for this visit. Gen:   Awake, no distress   Resp:  Normal effort  MSK:   Moves extremities without difficulty  Other:  Bruising on right side of abdomen, very tender to palpation  Medical Decision Making  Medically screening exam initiated at 6:22 PM.  Appropriate orders placed.  Miguel [name] was informed that the remainder of the evaluation will be completed by another provider, this initial triage assessment does not replace that evaluation, and the importance of remaining in the ED until their evaluation is complete.    Cameron Ali, PA-C 11/14/23 1827

## 2023-11-14 NOTE — ED Triage Notes (Signed)
First Nurse Note;  Pt via [facility] from home. Pt c/o fall 3 days ago. Pt c/o R hip and flank pain. EMS noted some bruising to the R flank. Reports positive head injury and LOC when he fell. Pt is A&Ox4 and NAD 106 HR  98% on RA 126/65 BP

## 2023-11-15 ENCOUNTER — Other Ambulatory Visit: Payer: Self-pay

## 2023-11-15 ENCOUNTER — Emergency Department
Admission: EM | Admit: 2023-11-15 | Discharge: 2023-11-15 | Disposition: A | Discharge status: Home / Self Care | Payer: MEDICAID | Attending: Emergency Medicine | Admitting: Emergency Medicine

## 2023-11-15 DIAGNOSIS — W19XXXA Unspecified fall, initial encounter: Secondary | ICD-10-CM

## 2023-11-15 DIAGNOSIS — S301XXA Contusion of abdominal wall, initial encounter: Secondary | ICD-10-CM

## 2023-11-15 MED ORDER — TRAMADOL HCL 50 MG PO TABS
50.0000 mg | ORAL_TABLET | Freq: Four times a day (QID) | ORAL | 0 refills | Status: DC | PRN
  Filled 2023-11-15: qty 20, 5d supply, fill #0

## 2023-11-15 MED ORDER — ACETAMINOPHEN 500 MG PO TABS
1000.0000 mg | ORAL_TABLET | Freq: Once | ORAL | Status: AC
Start: 2023-11-15 — End: 2023-11-15
  Administered 2023-11-15: 1000 mg via ORAL
  Filled 2023-11-15: qty 2

## 2023-11-15 NOTE — ED Notes (Signed)
Has also been taking ibuprofen.

## 2023-11-15 NOTE — ED Provider Notes (Signed)
[facility] Provider Note    Event Date/Time   First MD Initiated Contact with Patient 11/15/23 [phone]     (approximate)   History   Fall   HPI  Miguel [name] is a 62 y.o. male with history of alcohol abuse, anxiety, fall who presents after a fall which occurred 4 days ago.  Patient describes bruising to his right hip and abdomen from the fall.  No lightheadedness.  He reports he slipped on wet leaves.  No head injury.  He is not on blood thinners.     Physical Exam   Triage Vital Signs: ED Triage Vitals  Encounter Vitals Group     BP 11/14/23 1826 91/61     Systolic BP Percentile --      Diastolic BP Percentile --      Pulse Rate 11/14/23 1826 (!) 104     Resp 11/14/23 1826 17     Temp 11/14/23 1826 98.3 F (36.8 C)     Temp Source 11/14/23 1826 Oral     SpO2 11/14/23 1826 96 %     Weight --      Height --      Head Circumference --      Peak Flow --      Pain Score 11/14/23 1824 10     Pain Loc --      Pain Education --      Exclude from Growth Chart --     Most recent vital signs: Vitals:   11/15/23 0210 11/15/23 0607  BP: 115/81 129/85  Pulse: 87 85  Resp: 18 20  Temp: 98.4 F (36.9 C) 98.5 F (36.9 C)  SpO2: (!) 88% 100%     General: Awake, no distress.  CV:  Good peripheral perfusion.  Resp:  Normal effort.  Abd:  No distention.  See below Other:  Bruising noted to the right hip, and just above to the right lower abdominal wall, no abdominal tenderness to palpation, no CVA tenderness.  Ambulating well with slight limp, no other signs of trauma   ED Results / Procedures / Treatments   Labs (all labs ordered are listed, but only abnormal results are displayed) Labs Reviewed  BASIC METABOLIC PANEL - Abnormal; Notable for the following components:      Result Value   Sodium 132 (*)    CO2 16 (*)    Glucose, Bld 244 (*)    Creatinine, Ser 1.40 (*)    Calcium 8.6 (*)    GFR, Estimated 56 (*)    Anion gap 17 (*)    All  other components within normal limits  CBC WITH DIFFERENTIAL/PLATELET - Abnormal; Notable for the following components:   Hemoglobin 12.3 (*)    HCT 38.4 (*)    RDW 20.4 (*)    All other components within normal limits     EKG     RADIOLOGY Hip x-ray viewed interpreted me, no fracture    PROCEDURES:  Critical Care performed:   Procedures   MEDICATIONS ORDERED IN ED: Medications  acetaminophen (TYLENOL) tablet 1,000 mg (1,000 mg Oral Given 11/15/23 0110)     IMPRESSION / MDM / ASSESSMENT AND PLAN / ED COURSE  I reviewed the triage vital signs and the nursing notes. Patient's presentation is most consistent with acute presentation with potential threat to life or bodily function.  Patient presents with fall as detailed above, does have some bruising along the right hip and right abdominal wall.  Reassuring that this happened several days ago, he is not on blood thinners.  Hemoglobin is stable, not consistent with retroperitoneal bleed  Hip x-ray without evidence of fracture, this appears to be primarily contusion, recommend supportive care, outpatient follow-up with Ortho as needed.        FINAL CLINICAL IMPRESSION(S) / ED DIAGNOSES   Final diagnoses:  Fall, initial encounter  Contusion of abdominal wall, initial encounter     Rx / DC Orders   ED Discharge Orders          Ordered    traMADol (ULTRAM) 50 MG tablet  Every 6 hours PRN        11/15/23 0736             Note:  This document was prepared using Dragon voice recognition software and may include unintentional dictation errors.   Jene Every, MD 11/15/23 [phone]

## 2023-11-19 ENCOUNTER — Telehealth: Payer: MEDICAID | Admitting: Physician Assistant

## 2023-11-19 ENCOUNTER — Ambulatory Visit: Payer: Self-pay

## 2023-11-19 NOTE — Telephone Encounter (Signed)
  Chief Complaint: Wheezing, Shaking, pain, no appetite, bruising, feels poorly Symptoms: above Frequency: Since fall 11/15/3023 Pertinent Negatives: Patient denies  Disposition: [] ED /[] Urgent Care (no appt availability in office) / [] Appointment(In office/virtual)/ []  [facility]/ [] Home Care/ [] Refused Recommended Disposition /[] [facility]/ []  Follow-up with PCP Additional Notes: Pt was fell and was seen in Ed on 12/30. Pt has wheezing bruises,pain, no appetite,feels poorly, and is shaking. Pt also has an upcoming court date for a domestic dispute that he would like a medical excuse for. Pt is distressed about being released from hospital without transportation. Pt had to walk home. MyChart VV scheduled for 1pm.    Reason for Disposition  [1] MILD difficulty breathing (e.g., minimal/no SOB at rest, SOB with walking, pulse <100) AND [2] NEW-onset or WORSE than normal  Answer Assessment - Initial Assessment Questions 1. RESPIRATORY STATUS: Describe your breathing? (e.g., wheezing, shortness of breath, unable to speak, severe coughing)      Wheezing and sweating 2. ONSET: When did this breathing problem begin?      4 days 3. PATTERN Does the difficult breathing come and go, or has it been constant since it started?      Comes and goes, better when he lays down 4. SEVERITY: How bad is your breathing? (e.g., mild, moderate, severe)    - MILD: No SOB at rest, mild SOB with walking, speaks normally in sentences, can lie down, no retractions, pulse < 100.    - MODERATE: SOB at rest, SOB with minimal exertion and prefers to sit, cannot lie down flat, speaks in phrases, mild retractions, audible wheezing, pulse 100-120.    - SEVERE: Very SOB at rest, speaks in single words, struggling to breathe, sitting hunched forward, retractions, pulse > 120      moderate  7. LUNG HISTORY: Do you have any history of lung disease?  (e.g., pulmonary embolus, asthma, emphysema)      yes 8. CAUSE: What do you think is causing the breathing problem?      unsure 9. OTHER SYMPTOMS: Do you have any other symptoms? (e.g., dizziness, runny nose, cough, chest pain, fever)     Runny nose, pain  Protocols used: Breathing Difficulty-A-AH

## 2023-12-06 ENCOUNTER — Emergency Department
Admission: EM | Admit: 2023-12-06 | Discharge: 2023-12-06 | Discharge status: Against Medical Advice | Payer: MEDICAID | Attending: Emergency Medicine | Admitting: Emergency Medicine

## 2023-12-06 ENCOUNTER — Other Ambulatory Visit: Payer: Self-pay

## 2023-12-06 DIAGNOSIS — Z5321 Procedure and treatment not carried out due to patient leaving prior to being seen by health care provider: Secondary | ICD-10-CM | POA: Diagnosis not present

## 2023-12-06 DIAGNOSIS — R69 Illness, unspecified: Secondary | ICD-10-CM | POA: Diagnosis present

## 2023-12-06 NOTE — ED Triage Notes (Signed)
Pt arrives via POV with generalized feeling of unwellness. Pt seen at urgent care prior to coming here.

## 2023-12-07 ENCOUNTER — Emergency Department
Admission: EM | Admit: 2023-12-07 | Discharge: 2023-12-08 | Disposition: A | Discharge status: Other Acute Inpatient Hospital | Payer: MEDICAID | Attending: Emergency Medicine | Admitting: Emergency Medicine

## 2023-12-07 ENCOUNTER — Other Ambulatory Visit: Payer: Self-pay

## 2023-12-07 DIAGNOSIS — R456 Violent behavior: Secondary | ICD-10-CM | POA: Diagnosis present

## 2023-12-07 DIAGNOSIS — F1010 Alcohol abuse, uncomplicated: Secondary | ICD-10-CM | POA: Diagnosis not present

## 2023-12-07 DIAGNOSIS — R4588 Nonsuicidal self-harm: Secondary | ICD-10-CM

## 2023-12-07 DIAGNOSIS — F1914 Other psychoactive substance abuse with psychoactive substance-induced mood disorder: Secondary | ICD-10-CM | POA: Diagnosis not present

## 2023-12-07 DIAGNOSIS — F1020 Alcohol dependence, uncomplicated: Secondary | ICD-10-CM | POA: Diagnosis not present

## 2023-12-07 DIAGNOSIS — Z7289 Other problems related to lifestyle: Secondary | ICD-10-CM

## 2023-12-07 DIAGNOSIS — Y908 Blood alcohol level of 240 mg/100 ml or more: Secondary | ICD-10-CM | POA: Diagnosis not present

## 2023-12-07 DIAGNOSIS — Z630 Problems in relationship with spouse or partner: Secondary | ICD-10-CM

## 2023-12-07 DIAGNOSIS — Y92009 Unspecified place in unspecified non-institutional (private) residence as the place of occurrence of the external cause: Secondary | ICD-10-CM

## 2023-12-07 DIAGNOSIS — F332 Major depressive disorder, recurrent severe without psychotic features: Secondary | ICD-10-CM | POA: Insufficient documentation

## 2023-12-07 DIAGNOSIS — F10220 Alcohol dependence with intoxication, uncomplicated: Secondary | ICD-10-CM | POA: Diagnosis present

## 2023-12-07 DIAGNOSIS — Z653 Problems related to other legal circumstances: Secondary | ICD-10-CM

## 2023-12-07 LAB — CBC
HCT: 43 % (ref 39.0–52.0)
Hemoglobin: 13.9 g/dL (ref 13.0–17.0)
MCH: 28.8 pg (ref 26.0–34.0)
MCHC: 32.3 g/dL (ref 30.0–36.0)
MCV: 89 fL (ref 80.0–100.0)
Platelets: 269 10*3/uL (ref 150–400)
RBC: 4.83 MIL/uL (ref 4.22–5.81)
RDW: 18.3 % — ABNORMAL HIGH (ref 11.5–15.5)
WBC: 8.2 10*3/uL (ref 4.0–10.5)
nRBC: 0 % (ref 0.0–0.2)

## 2023-12-07 LAB — COMPREHENSIVE METABOLIC PANEL
ALT: 14 U/L (ref 0–44)
AST: 13 U/L — ABNORMAL LOW (ref 15–41)
Albumin: 4.1 g/dL (ref 3.5–5.0)
Alkaline Phosphatase: 73 U/L (ref 38–126)
Anion gap: 12 (ref 5–15)
BUN: 16 mg/dL (ref 8–23)
CO2: 18 mmol/L — ABNORMAL LOW (ref 22–32)
Calcium: 8.9 mg/dL (ref 8.9–10.3)
Chloride: 102 mmol/L (ref 98–111)
Creatinine, Ser: 0.91 mg/dL (ref 0.61–1.24)
GFR, Estimated: >60 mL/min (ref >60)
Glucose, Bld: 115 mg/dL — ABNORMAL HIGH (ref 70–99)
Potassium: 3.3 mmol/L — ABNORMAL LOW (ref 3.5–5.1)
Sodium: 132 mmol/L — ABNORMAL LOW (ref 135–145)
Total Bilirubin: 0.5 mg/dL (ref 0.0–1.2)
Total Protein: 7.7 g/dL (ref 6.5–8.1)

## 2023-12-07 LAB — SALICYLATE LEVEL: Salicylate Lvl: <7 mg/dL — ABNORMAL LOW (ref 7.0–30.0)

## 2023-12-07 LAB — ETHANOL: Alcohol, Ethyl (B): 314 mg/dL (ref <10)

## 2023-12-07 LAB — ACETAMINOPHEN LEVEL: Acetaminophen (Tylenol), Serum: <10 ug/mL — ABNORMAL LOW (ref 10–30)

## 2023-12-07 MED ORDER — LORAZEPAM 1 MG PO TABS
1.0000 mg | ORAL_TABLET | Freq: Once | ORAL | Status: AC
  Administered 2023-12-07: 1 mg via ORAL
  Filled 2023-12-07: qty 1

## 2023-12-07 NOTE — ED Notes (Signed)
Patient in the bathroom hitting the walls. Patient redirected by this Clinical research associate and [employer] officer.

## 2023-12-07 NOTE — ED Notes (Signed)
 ivc prior to arrival.

## 2023-12-07 NOTE — ED Notes (Signed)
Pt belongings: Black shoes  Gray socks Black pants Black shirt Gray jacket [employer]

## 2023-12-07 NOTE — ED Provider Notes (Signed)
[facility] Provider Note    Event Date/Time   First MD Initiated Contact with Patient 12/07/23 2137     (approximate)   History   Psychiatric Evaluation   HPI  Miguel [name] is a 63 y.o. male who presents to the ED for evaluation of Psychiatric Evaluation   I am readily familiar with this patient.  History of alcoholism.  He presents to the ED with police under IVC for evaluation of his mental health in the setting of an argument with his wife, ethanol intake and self cutting behavior.  He reports get into a verbal argument with his wife and that sometimes she is mean to him.  Reports cutting his abdomen but did not really want to kill himself.  No other recreational drugs beyond ethanol.   Physical Exam   Triage Vital Signs: ED Triage Vitals  Encounter Vitals Group     BP 12/07/23 2124 (!) 131/92     Systolic BP Percentile --      Diastolic BP Percentile --      Pulse Rate 12/07/23 2124 100     Resp 12/07/23 2124 20     Temp 12/07/23 2124 98.2 F (36.8 C)     Temp Source 12/07/23 2124 Oral     SpO2 12/07/23 2124 98 %     Weight 12/07/23 2123 175 lb (79.4 kg)     Height 12/07/23 2123 6' (1.829 m)     Head Circumference --      Peak Flow --      Pain Score 12/07/23 2123 0     Pain Loc --      Pain Education --      Exclude from Growth Chart --     Most recent vital signs: Vitals:   12/07/23 2124  BP: (!) 131/92  Pulse: 100  Resp: 20  Temp: 98.2 F (36.8 C)  SpO2: 98%    General: Awake, no distress.  CV:  Good peripheral perfusion.  Resp:  Normal effort.  Abd:  No distention.  MSK:  No deformity noted.  Neuro:  No focal deficits appreciated. Other:     ED Results / Procedures / Treatments   Labs (all labs ordered are listed, but only abnormal results are displayed) Labs Reviewed  CBC - Abnormal; Notable for the following components:      Result Value   RDW 18.3 (*)    All other components within normal limits   COMPREHENSIVE METABOLIC PANEL  ETHANOL  SALICYLATE LEVEL  ACETAMINOPHEN LEVEL  URINE DRUG SCREEN, QUALITATIVE ([facility] ONLY)    EKG   RADIOLOGY   Official radiology report(s): No results found.  PROCEDURES and INTERVENTIONS:  Procedures  Medications - No data to display   IMPRESSION / MDM / ASSESSMENT AND PLAN / ED COURSE  I reviewed the triage vital signs and the nursing notes.  Differential diagnosis includes, but is not limited to, suicide attempt, polysubstance abuse, DTs or ethanol withdrawals  {Patient presents with symptoms of an acute illness or injury that is potentially life-threatening.  Patient presents under IVC due to alcohol tox occasion and self cutting behavior.  He is actively intoxicated doubt withdrawals.  Normal CBC.  Remaining remainder of screening serum workup.  Suitable for psychiatric evaluation.      FINAL CLINICAL IMPRESSION(S) / ED DIAGNOSES   Final diagnoses:  None     Rx / DC Orders   ED Discharge Orders     None  Note:  This document was prepared using Dragon voice recognition software and may include unintentional dictation errors.   Delton Prairie, MD 12/07/23 2219

## 2023-12-07 NOTE — ED Notes (Signed)
Patient given sandwich bag. 
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
## 2023-12-07 NOTE — ED Notes (Signed)
Patient arrived under IVC by [employer].  Patient tearful and appears to be intoxicated.  Patient denies SI/HI/AVH. Patient has self inflected scratches to left side of abdomen. No bleeding noted. Patient is blaming his wife for being here. Per patient he drank around a 1/2 pint.

## 2023-12-07 NOTE — ED Notes (Signed)
Critical Etoh of 314. EDP Dr. Elesa Massed made aware. No new orders at this time.

## 2023-12-07 NOTE — ED Notes (Signed)
ED Provider at bedside. 

## 2023-12-07 NOTE — ED Notes (Signed)
Patient went into restroom and punching wall. Writer redirected patient back to bed.

## 2023-12-08 ENCOUNTER — Encounter: Payer: Self-pay | Admitting: Psychiatry

## 2023-12-08 DIAGNOSIS — Z653 Problems related to other legal circumstances: Secondary | ICD-10-CM

## 2023-12-08 DIAGNOSIS — F1010 Alcohol abuse, uncomplicated: Secondary | ICD-10-CM

## 2023-12-08 DIAGNOSIS — Z630 Problems in relationship with spouse or partner: Secondary | ICD-10-CM

## 2023-12-08 LAB — URINE DRUG SCREEN, QUALITATIVE (ARMC ONLY)
Amphetamines, Ur Screen: NOT DETECTED
Barbiturates, Ur Screen: NOT DETECTED
Benzodiazepine, Ur Scrn: POSITIVE — AB
Cannabinoid 50 Ng, Ur ~~LOC~~: POSITIVE — AB
Cocaine Metabolite,Ur ~~LOC~~: NOT DETECTED
MDMA (Ecstasy)Ur Screen: NOT DETECTED
Methadone Scn, Ur: NOT DETECTED
Opiate, Ur Screen: NOT DETECTED
Phencyclidine (PCP) Ur S: NOT DETECTED
Tricyclic, Ur Screen: NOT DETECTED

## 2023-12-08 MED ORDER — ALBUTEROL SULFATE HFA 108 (90 BASE) MCG/ACT IN AERS: 1.0000 | INHALATION_SPRAY | RESPIRATORY_TRACT | Status: DC | PRN

## 2023-12-08 MED ORDER — LORAZEPAM 1 MG PO TABS
1.0000 mg | ORAL_TABLET | Freq: Once | ORAL | Status: AC
  Administered 2023-12-08: 1 mg via ORAL
  Filled 2023-12-08: qty 1
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
## 2023-12-08 NOTE — Progress Notes (Signed)
Transitions of Care Department Director offered emotional support to Miguel [name] who was concerned about his wife at home. Director informed Miguel [name] that his wife was being outreached by the Adult Pensions consultant. Miguel [name] was appreciative that help was being extended.
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
## 2023-12-08 NOTE — ED Notes (Signed)
Pt continues pacing in day room holding hands in the air stating "why god why, no no no"  when staff approached pt he stated that he life is now ruined and that his wife "will be taken away" and that "if she's gone I have nothing to live for"  Pt is making passive threats that he will end his life d/t being sent to an inpatient facility. Cont to monitor as ordered

## 2023-12-08 NOTE — ED Notes (Signed)
COUNTY  SHERIFF  DEPT  CALLED  FOR  TRANSPORT TO  [facility] 

## 2023-12-08 NOTE — Progress Notes (Signed)
LCSW Progress Note  [id number]   Miguel [name]  12/08/2023  10:33 AM  Description:   Inpatient Psychiatric Referral  Patient was recommended inpatient per Tera Helper, NP There are no available beds at [facility], per [facility] Rona Ravens RN. Patient was referred to the following out of network facilities:    Destination  Service Provider Address Phone Fax  [facility] [address], [place] [address] [phone]  [facility] [address], [place] [address] [phone]  [facility] [address] [place] [address] [phone]  [facility] [address] [place] [address] [phone]  [facility] [address] [place], [place] [address] [phone]  [facility] [address] [place] [address] [phone]  [facility] [address] [place] [address] [phone]  [facility] [address] [place] [address] [phone]  [facility] [address] [place] [address] [phone]  [facility] [address] [place] [address] [phone]  [facility] [address] [place] [address] [phone]  [facility] [address], [place] [address] [phone]  [facility] [address], [place] [address] [phone]  [facility] EFAX [address], [place] [phone]  [facility] [address], [place] [address] [phone]  [facility] [address] [place] [address] [phone]    Discharge Information (From admission, onwar    Situation ongoing, CSW to continue following and update chart as more information becomes available.      [place] Stormy Connon, MSW, LCSW  12/08/2023 10:33 AM

## 2023-12-08 NOTE — BH Assessment (Signed)
Comprehensive Clinical Assessment (CCA) Note  12/08/2023 Miguel [name] [id number]  Chief Complaint: Patient is a 63 year old male presenting to [facility] ED under IVC. Per triage note Pt to ED vis [facility] under IVC, per PD pt consumed 1/2 a pint of vodka tonight and used a knife to cut his abd twice, pt has superficial abrasions to left side abd. Pt appears intoxicated and states he feels SI. Patient is familiar to this ED as he often presents under the influence of alcohol with SI. During assessment patient appears alert and oriented x4, calm and cooperative. 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 Patient's current BAL is 314. Patient currently denies SI/HI/AH/VH.  Chief Complaint  Patient presents with   Psychiatric Evaluation   Visit Diagnosis: Alcohol use disorder, severe. Substance-induced mood disorder    CCA Screening, Triage and Referral (STR)  Patient Reported Information How did you hear about [name]? Legal System  Referral name: No data recorded Referral phone number: No data recorded  Whom do you see for routine medical problems? No data recorded Practice/Facility Name: No data recorded Practice/Facility Phone Number: No data recorded Name of Contact: No data recorded Contact Number: No data recorded Contact Fax Number: No data recorded Prescriber Name: No data recorded Prescriber Address (if known): No data recorded  What Is the Reason for Your Visit/Call Today? Pt to ED vis [facility]  under IVC, per PD pt consumed 1/2 a pint of vodka tonight and used a knife to cut his abd twice, pt has superficial abrasions to left side abd. Pt appears intoxicated and states he feels SI  How Long Has This Been Causing You Problems? > than 6 months  What Do You Feel Would Help You the Most Today? Stress Management   Have You Recently Been in Any Inpatient Treatment (Hospital/Detox/Crisis Center/28-Day Program)? No data recorded Name/Location of Program/Hospital:No data recorded How Long Were You There? No data recorded When Were You Discharged? No data recorded  Have You Ever Received Services From [facility] Before? No data recorded Who Do You See at [facility]? No data recorded  Have You Recently Had Any Thoughts About Hurting Yourself? Yes  Are You Planning to Commit Suicide/Harm Yourself At This time? No   Have you Recently Had Thoughts About Hurting Someone [name]? No  Explanation: n/a   Have You Used Any Alcohol or Drugs in the Past 24 Hours? Yes  How Long Ago Did You Use Drugs or Alcohol? No data recorded What Did You Use and How Much? Alcohol "1/2 pint"   Do You Currently Have a Therapist/Psychiatrist? No  Name of Therapist/Psychiatrist: [facility]   Have You Been Recently Discharged From Any Office Practice or Programs? No  Explanation of Discharge From Practice/Program: n/a     CCA Screening Triage Referral Assessment Type of Contact: Face-to-Face  Is this Initial or Reassessment? No data recorded Date Telepsych consult ordered in [facility]:  No data recorded Time Telepsych consult ordered in [facility]:  No data recorded  Patient Reported Information Reviewed? No data recorded Patient Left Without Being Seen? No data recorded Reason for Not Completing Assessment: No data recorded  Collateral Involvement: None provided   Does Patient Have a Court Appointed Legal Guardian? No data recorded Name and Contact of Legal Guardian: No data recorded If Minor and Not Living  with Parent(s), Who has Custody? n/a  Is CPS involved or ever been involved? Never  Is APS involved or ever been involved? Never   Patient Determined To Be At Risk for Harm To Self or Others Based on Review of Patient Reported Information or Presenting Complaint? Yes, for Self-Harm  Method: No Plan  Availability of Means: No access or NA  Intent: Vague intent or NA  Notification Required: No need or identified person  Additional Information for Danger to Others Potential: -- (n/a)  Additional Comments for Danger to Others Potential: n/a  Are There Guns or Other Weapons in Your Home? No  Types of Guns/Weapons: n/a  Are These Weapons Safely Secured?                            -- (n/a)  Who Could Verify You Are Able To Have These Secured: n/a  Do You Have any Outstanding Charges, Pending Court Dates, Parole/Probation? None reported  Contacted To Inform of Risk of Harm To Self or Others: Other: Comment (n/a)   Location of Assessment: [facility] ED   Does Patient Present under Involuntary Commitment? Yes  IVC Papers Initial File Date: No data recorded  [place] of Residence: [place]   Patient Currently Receiving the Following Services: Medication Management   Determination of Need: Emergent (2 hours)   Options For Referral: ED Visit     CCA Biopsychosocial Intake/Chief Complaint:  No data recorded Current Symptoms/Problems: No data recorded  Patient Reported Schizophrenia/Schizoaffective Diagnosis in Past: No   Strengths: Patient is able to communicate his needs  Preferences: No data recorded Abilities: No data recorded  Type of Services Patient Feels are Needed: No data recorded  Initial Clinical Notes/Concerns: No data recorded  Mental Health Symptoms Depression:  Change in energy/activity; Fatigue; Hopelessness   Duration of Depressive symptoms: Greater than two weeks   Mania:  None   Anxiety:   Fatigue; Restlessness   Psychosis:  None    Duration of Psychotic symptoms: No data recorded  Trauma:  None   Obsessions:  None   Compulsions:  None   Inattention:  None   Hyperactivity/Impulsivity:  None   Oppositional/Defiant Behaviors:  None   Emotional Irregularity:  None   Other Mood/Personality Symptoms:  No data recorded   Mental Status Exam Appearance and self-care  Stature:  Average   Weight:  Overweight   Clothing:  Disheveled   Grooming:  Neglected   Cosmetic use:  None   Posture/gait:  Normal   Motor activity:  Not Remarkable   Sensorium  Attention:  Normal   Concentration:  Normal   Orientation:  X5   Recall/memory:  Normal   Affect and Mood  Affect:  Appropriate   Mood:  Anxious; Depressed   Relating  Eye contact:  Normal   Facial expression:  Responsive   Attitude toward examiner:  Cooperative   Thought and Language  Speech flow: Slurred   Thought content:  Appropriate to Mood and Circumstances   Preoccupation:  None   Hallucinations:  None  Organization:  No data recorded  [employer] of Knowledge:  Fair   Intelligence:  Average   Abstraction:  Normal   Judgement:  Fair   Dance movement psychotherapist:  Adequate   Insight:  Lacking; Poor; Denial   Decision Making:  Impulsive   Social Functioning  Social Maturity:  Impulsive   Social Judgement:  Heedless   Stress  Stressors:  Relationship; Transitions   Coping Ability:  Contractor Deficits:  None   Supports:  Family     Religion: Religion/Spirituality Are You A Religious Person?: No  Leisure/Recreation: Leisure / Recreation Do You Have Hobbies?: No  Exercise/Diet: Exercise/Diet Do You Exercise?: No Have You Gained or Lost A Significant Amount of Weight in the Past Six Months?: No Do You Follow a Special Diet?: No Do You Have Any Trouble Sleeping?: No   CCA Employment/Education Employment/Work Situation: Employment / Work Situation Employment Situation:  Unemployed Patient's Job has Been Impacted by Current Illness: No Has Patient ever Been in Equities trader?: No  Education: Education Is Patient Currently Attending School?: No Did You Have An Individualized Education Program (IIEP): No Did You Have Any Difficulty At [employer]?: No Patient's Education Has Been Impacted by Current Illness: No   CCA Family/Childhood History Family and Relationship History: Family history Marital status: Married Number of Years Married: 42 What types of issues is patient dealing with in the relationship?: Patient's wife is tired of patient's drinking habits Additional relationship information: None reported Does patient have children?: No  Childhood History:  Childhood History By whom was/is the patient raised?: Both parents Did patient suffer any verbal/emotional/physical/sexual abuse as a child?: No Did patient suffer from severe childhood neglect?: No Has patient ever been sexually abused/assaulted/raped as an adolescent or adult?: No Was the patient ever a victim of a crime or a disaster?: No Witnessed domestic violence?: No Has patient been affected by domestic violence as an adult?: No  Child/Adolescent Assessment:     CCA Substance Use Alcohol/Drug Use: Alcohol / Drug Use Pain Medications: see mar Prescriptions: see mar Over the Counter: see mar History of alcohol / drug use?: Yes Negative Consequences of Use: Personal relationships Withdrawal Symptoms: Change in blood pressure, Tingling, Tremors, Weakness, Patient aware of relationship between substance abuse and physical/medical complications Substance #1 Name of Substance 1: Alcohol 1 - Age of First Use: unknonw 1 - Amount (size/oz): 1/2 pint 1 - Frequency: daily 1 - Last Use / Amount: 12/07/23 1 - Method of Aquiring: purchasing 1- Route of Use: oral                       ASAM's:  Six Dimensions of Multidimensional Assessment  Dimension 1:  Acute Intoxication and/or  Withdrawal Potential:      Dimension 2:  Biomedical Conditions and Complications:      Dimension 3:  Emotional, Behavioral, or Cognitive Conditions and Complications:     Dimension 4:  Readiness to Change:     Dimension 5:  Relapse, Continued use, or Continued Problem Potential:     Dimension 6:  Recovery/Living Environment:     ASAM Severity Score:    ASAM Recommended Level of Treatment: ASAM Recommended Level of Treatment: Level III Residential Treatment   Substance use Disorder (SUD) Substance Use Disorder (SUD)  Checklist Symptoms of Substance Use: Continued use despite having a persistent/recurrent physical/psychological problem caused/exacerbated by use, Evidence of withdrawal (Comment), Continued use despite persistent or recurrent social, interpersonal problems, caused or  exacerbated by use, Large amounts of time spent to obtain, use or recover from the substance(s), Evidence of tolerance, Presence of craving or strong urge to use, Recurrent use that results in a failure to fulfill major role obligations (work, school, home), Social, occupational, recreational activities given up or reduced due to use, Substance(s) often taken in larger amounts or over longer times than was intended, Persistent desire or unsuccessful efforts to cut down or control use  Recommendations for Services/Supports/Treatments:    DSM5 Diagnoses: Patient Active Problem List   Diagnosis Date Noted   Fall 10/21/2023   Iron deficiency 05/28/2023   Alcohol withdrawal with inpatient treatment (HCC) 05/24/2023   Total bilirubin, elevated 05/24/2023   Traumatic ecchymosis 05/24/2023   Alcoholic ketoacidosis 04/29/2023   Hypokalemia 04/29/2023   Hypocalcemia 04/29/2023   Essential hypertension 04/29/2023   Depression 04/29/2023   Syncope 04/28/2023   Encounter for screening for HIV 04/16/2023   Encounter for hepatitis C screening test for low risk patient 04/16/2023   Establishing care with new doctor,  encounter for 04/16/2023   Hyperthyroidism 04/16/2023   Elevated serum glucose 04/16/2023   Mixed hyperlipidemia 04/16/2023   Centrilobular emphysema (HCC) 04/16/2023   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) 04/16/2023   Orthostatic hypotension 11/28/2020   Genetic testing 07/15/2020   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    Hx of colonic polyps    Generalized anxiety disorder 10/14/2019   Alcoholic cirrhosis of liver without ascites (HCC) 06/14/2019   Alcohol abuse 06/14/2019   AKI (acute kidney injury) (HCC) 05/27/2019   Alcoholic intoxication without complication (HCC)    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

## 2023-12-08 NOTE — Progress Notes (Signed)
Pt has been accepted to [facility] TODAY Bed assignment: Main campus  Pt meets inpatient criteria per: Darrick Grinder NP  Attending Physician will be Loni Beckwith, MD  Report can be called to: [phone] (this is a pager, please leave call-back number when giving report)  Pt can arrive ASAP   Care Team Notified:  Darrick Grinder NP, Marijo Conception RN  [place] Elby Blackwelder LCSW-A   12/08/2023 11:04 AM

## 2023-12-08 NOTE — ED Notes (Signed)
Moved to [employer] by RN and security via wheelchair. 1 bag of personal belongings locked in [place] locker area. Pt alert and oriented X4, cooperative, RR even and unlabored, color WNL. Pt in NAD.

## 2023-12-08 NOTE — ED Notes (Addendum)
Paged Carrabelle to give report @ [phone]
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
## 2023-12-08 NOTE — ED Notes (Signed)
Mrs Exley called to report to pt that her aunt would be coming to stay with her until he was discharged from inpatient facility.  She wanted to share her gratitude with staff members from [facility] as well as thank everyone for their care and compassion to her and her husband.  Mr Singler was transported with [facility] to [facility], he is agreeable as well as looking forward to treatment.

## 2023-12-08 NOTE — ED Provider Notes (Signed)
Emergency Medicine Observation Re-evaluation Note  Miguel [name] is a 64 y.o. male, seen on rounds today.  Pt initially presented to the ED for complaints of Psychiatric Evaluation Currently, the patient is resting, voices no medical complaints.  Physical Exam  BP (!) 131/92   Pulse 100   Temp 98.2 F (36.8 C) (Oral)   Resp 20   Ht 6' (1.829 m)   Wt 79.4 kg   SpO2 98%   BMI 23.73 kg/m  Physical Exam General: Resting in NAD Cardiac: No cyanosis Lungs: Equal rise and fall Psych: Not agitated  ED Course / MDM  EKG:   I have reviewed the labs performed to date as well as medications administered while in observation.  Recent changes in the last 24 hours include no events overnight.  Plan  Current plan is for psychiatric disposition.    Irean Hong, MD 12/08/23 [phone]

## 2023-12-08 NOTE — ED Notes (Signed)
ivc/psych consult ordered/pending.Miguel [name]

## 2023-12-08 NOTE — ED Notes (Signed)
ivc/consult done/pt meets involuntary commitment criteria & is recommended for inpatient psychiatric admission.

## 2023-12-08 NOTE — ED Notes (Signed)
[facility] spoke to [employer] [phone]) for an update on Mrs. Sacra's (Patient's wife) 2nd well check at 3:15pm.  Officer stated Mrs. Coburn was fine on arrival.  [facility] spoke directly to Mrs. Cordner who reported that Nigel Bridgeman from [facility] [phone]) would be coming to give her a bath this evening.  In addition, Mrs. Geen reported that Latoya would prepare her dinner from the available food in her refrigerator/freezer.  Latoya usually comes in to assist her 5 days a week, with one weekday and Sundays off.  [facility] followed up with Nigel Bridgeman and confirmed that she would be going to assist Ms. Ruppe each day except this Sunday.  Officer McClean privately advised that upon her arrival [employer] drove up at the same time with 2 employees present.  [facility] advised Officer McClean that someone would be coming back later tonight to confirm Mrs. Dufour was bathe and fed.  Officer McClean stated she would pass this information on to the next shift of officers.    Graylon Good, [facility]

## 2023-12-08 NOTE — ED Notes (Signed)
Psych telehealth provider at bedside

## 2023-12-22 ENCOUNTER — Emergency Department
Admission: EM | Admit: 2023-12-22 | Discharge: 2023-12-22 | Discharge status: Against Medical Advice | Payer: MEDICAID | Attending: Emergency Medicine | Admitting: Emergency Medicine

## 2023-12-22 ENCOUNTER — Other Ambulatory Visit: Payer: Self-pay

## 2023-12-22 DIAGNOSIS — R0602 Shortness of breath: Secondary | ICD-10-CM | POA: Insufficient documentation

## 2023-12-22 DIAGNOSIS — R059 Cough, unspecified: Secondary | ICD-10-CM | POA: Diagnosis present

## 2023-12-22 DIAGNOSIS — Z5321 Procedure and treatment not carried out due to patient leaving prior to being seen by health care provider: Secondary | ICD-10-CM | POA: Insufficient documentation

## 2023-12-22 NOTE — ED Notes (Signed)
 Blood work sent to lab.

## 2023-12-22 NOTE — ED Triage Notes (Addendum)
 SOB x 1 day.  Patient states he has had a small amount to drink.  Patient states inhaler is not working.  Takes advair  and resue inhalers. Also did not work yesterday.  Talks in full sentences. No SOB/ DOE. Voice clear and strong.  Patient is anxious

## 2023-12-22 NOTE — ED Provider Triage Note (Signed)
 Emergency Medicine Provider Triage Evaluation Note  ROBBEN JAGIELLO , a 63 y.o. male  was evaluated in triage.  Pt complains of shortness of breath, cough.  States using his inhalers yesterday and today  Review of Systems  Positive:  Negative:   Physical Exam  BP 129/74 (BP Location: Left Arm)   Pulse 92   Temp 98.4 F (36.9 C) (Oral)   Resp 18   SpO2 100%  Gen:   Awake, no distress  Resp:  Normal effort , no wheezing, crackles MSK:   Moves extremities without difficulty Other:    Medical Decision Making  Medically screening exam initiated at 6:57 PM.  Appropriate orders placed.  Todd MARLA Langton was informed that the remainder of the evaluation will be completed by another provider, this initial triage assessment does not replace that evaluation, and the importance of remaining in the ED until their evaluation is complete.  Patient with upper respiratory infection ordered chest x-ray CBC and CMP   Janit Kast, PA-C 12/22/23 1858

## 2023-12-23 ENCOUNTER — Emergency Department
Admission: EM | Admit: 2023-12-23 | Discharge: 2023-12-23 | Disposition: A | Discharge status: Home / Self Care | Payer: MEDICAID | Attending: Emergency Medicine | Admitting: Emergency Medicine

## 2023-12-23 ENCOUNTER — Emergency Department: Payer: MEDICAID

## 2023-12-23 ENCOUNTER — Other Ambulatory Visit: Payer: Self-pay

## 2023-12-23 DIAGNOSIS — I10 Essential (primary) hypertension: Secondary | ICD-10-CM | POA: Insufficient documentation

## 2023-12-23 DIAGNOSIS — Z046 Encounter for general psychiatric examination, requested by authority: Secondary | ICD-10-CM

## 2023-12-23 DIAGNOSIS — Z20822 Contact with and (suspected) exposure to covid-19: Secondary | ICD-10-CM | POA: Insufficient documentation

## 2023-12-23 DIAGNOSIS — F1020 Alcohol dependence, uncomplicated: Secondary | ICD-10-CM | POA: Diagnosis not present

## 2023-12-23 DIAGNOSIS — J4521 Mild intermittent asthma with (acute) exacerbation: Secondary | ICD-10-CM | POA: Diagnosis not present

## 2023-12-23 DIAGNOSIS — R4588 Nonsuicidal self-harm: Secondary | ICD-10-CM | POA: Diagnosis not present

## 2023-12-23 DIAGNOSIS — Y908 Blood alcohol level of 240 mg/100 ml or more: Secondary | ICD-10-CM | POA: Insufficient documentation

## 2023-12-23 DIAGNOSIS — F4325 Adjustment disorder with mixed disturbance of emotions and conduct: Secondary | ICD-10-CM

## 2023-12-23 DIAGNOSIS — F330 Major depressive disorder, recurrent, mild: Secondary | ICD-10-CM | POA: Diagnosis not present

## 2023-12-23 DIAGNOSIS — F10129 Alcohol abuse with intoxication, unspecified: Secondary | ICD-10-CM | POA: Diagnosis not present

## 2023-12-23 LAB — COMPREHENSIVE METABOLIC PANEL
ALT: 24 U/L (ref 0–44)
AST: 24 U/L (ref 15–41)
Albumin: 4.1 g/dL (ref 3.5–5.0)
Alkaline Phosphatase: 61 U/L (ref 38–126)
Anion gap: 16 — ABNORMAL HIGH (ref 5–15)
BUN: 12 mg/dL (ref 8–23)
CO2: 20 mmol/L — ABNORMAL LOW (ref 22–32)
Calcium: 8.5 mg/dL — ABNORMAL LOW (ref 8.9–10.3)
Chloride: 105 mmol/L (ref 98–111)
Creatinine, Ser: 1.04 mg/dL (ref 0.61–1.24)
GFR, Estimated: >60 mL/min (ref >60)
Glucose, Bld: 105 mg/dL — ABNORMAL HIGH (ref 70–99)
Potassium: 3.9 mmol/L (ref 3.5–5.1)
Sodium: 141 mmol/L (ref 135–145)
Total Bilirubin: 0.8 mg/dL (ref 0.0–1.2)
Total Protein: 7.2 g/dL (ref 6.5–8.1)

## 2023-12-23 LAB — RESP PANEL BY RT-PCR (RSV, FLU A&B, COVID)  RVPGX2
Influenza A by PCR: NEGATIVE
Influenza B by PCR: NEGATIVE
Resp Syncytial Virus by PCR: NEGATIVE
SARS Coronavirus 2 by RT PCR: NEGATIVE

## 2023-12-23 LAB — ETHANOL
Alcohol, Ethyl (B): 269 mg/dL — ABNORMAL HIGH (ref <10)
Alcohol, Ethyl (B): 93 mg/dL — ABNORMAL HIGH (ref <10)

## 2023-12-23 LAB — URINE DRUG SCREEN, QUALITATIVE (ARMC ONLY)
Amphetamines, Ur Screen: NOT DETECTED
Barbiturates, Ur Screen: POSITIVE — AB
Benzodiazepine, Ur Scrn: NOT DETECTED
Cannabinoid 50 Ng, Ur ~~LOC~~: POSITIVE — AB
Cocaine Metabolite,Ur ~~LOC~~: NOT DETECTED
MDMA (Ecstasy)Ur Screen: NOT DETECTED
Methadone Scn, Ur: NOT DETECTED
Opiate, Ur Screen: NOT DETECTED
Phencyclidine (PCP) Ur S: NOT DETECTED
Tricyclic, Ur Screen: NOT DETECTED

## 2023-12-23 LAB — ACETAMINOPHEN LEVEL: Acetaminophen (Tylenol), Serum: <10 ug/mL — ABNORMAL LOW (ref 10–30)

## 2023-12-23 LAB — SALICYLATE LEVEL: Salicylate Lvl: <7 mg/dL — ABNORMAL LOW (ref 7.0–30.0)

## 2023-12-23 LAB — CBC
HCT: 38.7 % — ABNORMAL LOW (ref 39.0–52.0)
Hemoglobin: 12.8 g/dL — ABNORMAL LOW (ref 13.0–17.0)
MCH: 28.3 pg (ref 26.0–34.0)
MCHC: 33.1 g/dL (ref 30.0–36.0)
MCV: 85.6 fL (ref 80.0–100.0)
Platelets: 326 10*3/uL (ref 150–400)
RBC: 4.52 MIL/uL (ref 4.22–5.81)
RDW: 17.5 % — ABNORMAL HIGH (ref 11.5–15.5)
WBC: 7.4 10*3/uL (ref 4.0–10.5)
nRBC: 0 % (ref 0.0–0.2)

## 2023-12-23 LAB — TROPONIN I (HIGH SENSITIVITY): Troponin I (High Sensitivity): 5 ng/L (ref <18)

## 2023-12-23 MED ORDER — LORAZEPAM 2 MG PO TABS: 0.0000 mg | ORAL_TABLET | Freq: Two times a day (BID) | ORAL | Status: DC

## 2023-12-23 MED ORDER — ALBUTEROL SULFATE HFA 108 (90 BASE) MCG/ACT IN AERS
2.0000 | INHALATION_SPRAY | RESPIRATORY_TRACT | 1 refills | Status: DC | PRN
  Filled 2023-12-23: qty 18, 25d supply, fill #0

## 2023-12-23 MED ORDER — METOPROLOL SUCCINATE ER 25 MG PO TB24
25.0000 mg | ORAL_TABLET | Freq: Every day | ORAL | Status: DC
  Administered 2023-12-23: 25 mg via ORAL
  Filled 2023-12-23: qty 1

## 2023-12-23 MED ORDER — LORAZEPAM 2 MG/ML IJ SOLN: 0.0000 mg | Freq: Two times a day (BID) | INTRAMUSCULAR | Status: DC

## 2023-12-23 MED ORDER — LISINOPRIL 10 MG PO TABS
40.0000 mg | ORAL_TABLET | Freq: Every day | ORAL | Status: DC
  Administered 2023-12-23: 40 mg via ORAL
  Filled 2023-12-23: qty 4

## 2023-12-23 MED ORDER — IPRATROPIUM-ALBUTEROL 0.5-2.5 (3) MG/3ML IN SOLN
3.0000 mL | Freq: Once | RESPIRATORY_TRACT | Status: AC
  Administered 2023-12-23: 3 mL via RESPIRATORY_TRACT
  Filled 2023-12-23: qty 3

## 2023-12-23 MED ORDER — SERTRALINE HCL 100 MG PO TABS
100.0000 mg | ORAL_TABLET | Freq: Every day | ORAL | 2 refills | Status: DC
  Filled 2023-12-23: qty 30, 30d supply, fill #0

## 2023-12-23 MED ORDER — BENZONATATE 100 MG PO CAPS
100.0000 mg | ORAL_CAPSULE | Freq: Three times a day (TID) | ORAL | Status: DC | PRN
  Administered 2023-12-23: 100 mg via ORAL
  Filled 2023-12-23: qty 1

## 2023-12-23 MED ORDER — MOMETASONE FURO-FORMOTEROL FUM 100-5 MCG/ACT IN AERO
2.0000 | INHALATION_SPRAY | Freq: Two times a day (BID) | RESPIRATORY_TRACT | Status: DC
  Administered 2023-12-23: 2 via RESPIRATORY_TRACT
  Filled 2023-12-23: qty 8.8

## 2023-12-23 MED ORDER — SERTRALINE HCL 100 MG PO TABS: 100.0000 mg | ORAL_TABLET | Freq: Every day | ORAL | 2 refills | Status: AC

## 2023-12-23 MED ORDER — LORAZEPAM 2 MG/ML IJ SOLN: 0.0000 mg | Freq: Four times a day (QID) | INTRAMUSCULAR | Status: DC

## 2023-12-23 MED ORDER — ALBUTEROL SULFATE HFA 108 (90 BASE) MCG/ACT IN AERS: 2.0000 | INHALATION_SPRAY | RESPIRATORY_TRACT | 1 refills | Status: AC | PRN

## 2023-12-23 MED ORDER — PREDNISONE 20 MG PO TABS
60.0000 mg | ORAL_TABLET | Freq: Once | ORAL | Status: AC
  Administered 2023-12-23: 60 mg via ORAL
  Filled 2023-12-23: qty 3

## 2023-12-23 MED ORDER — LORAZEPAM 2 MG PO TABS
0.0000 mg | ORAL_TABLET | Freq: Four times a day (QID) | ORAL | Status: DC
  Administered 2023-12-23: 1 mg via ORAL
  Administered 2023-12-23: 2 mg via ORAL
  Filled 2023-12-23 (×2): qty 1

## 2023-12-23 MED ORDER — PREDNISONE 20 MG PO TABS: 60.0000 mg | ORAL_TABLET | Freq: Every day | ORAL | Status: DC

## 2023-12-23 MED ORDER — NALTREXONE HCL 50 MG PO TABS
50.0000 mg | ORAL_TABLET | Freq: Every day | ORAL | 1 refills | Status: DC
  Filled 2023-12-23: qty 30, 30d supply, fill #0

## 2023-12-23 MED ORDER — THIAMINE HCL 100 MG/ML IJ SOLN: 100.0000 mg | Freq: Every day | INTRAMUSCULAR | Status: DC

## 2023-12-23 MED ORDER — NALTREXONE HCL 50 MG PO TABS: 50.0000 mg | ORAL_TABLET | Freq: Every day | ORAL | 1 refills | Status: AC

## 2023-12-23 MED ORDER — IPRATROPIUM-ALBUTEROL 0.5-2.5 (3) MG/3ML IN SOLN: 3.0000 mL | RESPIRATORY_TRACT | Status: DC | PRN

## 2023-12-23 MED ORDER — THIAMINE MONONITRATE 100 MG PO TABS
100.0000 mg | ORAL_TABLET | Freq: Every day | ORAL | Status: DC
  Administered 2023-12-23: 100 mg via ORAL
  Filled 2023-12-23: qty 1

## 2023-12-23 NOTE — ED Provider Notes (Cosign Needed Addendum)
 [facility] Follow-Up Consult Note  Patient Name: Miguel [name] MRN: [id number] DOB: 06/25/1961 DATE OF Consult: 12/23/2023  PRIMARY PSYCHIATRIC DIAGNOSES  1.  Alcohol use disorder 2.  Self Harm behaviors 3.  MDD, recurrent, mild  RECOMMENDATIONS  Please see initial Psychiatric Consult Note for full assessment and initial treatment recommendations. Additional recommendations:  Recommendations: Medication recommendations: recommend continuation of previously prescribed naltrexone  50mg  po daily for alcohol cravings; trazodone  100mg  po night for mood/sleep as needed; sertraline  100mg  po daily depression Non-Medication/therapeutic recommendations: recommend outpt intenssive alcohol treatment, discussed with patient; he has [facility] SW comes to home; enc to see medication specialist for substance abuse Is inpatient psychiatric hospitalization recommended for this patient? No (Explain why): he declines voluntary alcohol treatment at this time; he is not current imminent risk of harm to self or others; recommend follow up with outpatient [facility] Communication: Treatment team members (and family members if applicable) who were involved in treatment/care discussions and planning, and with whom we spoke or engaged with via secure text/chat, include the following:   Recommended another alcohol level, discussed with ED Provider who reports patient considered legally sober at this time.   Patient may discharge home from psychiatric evaluation determination once medically cleared  I have discussed my assessment and treatment recommendations with the patient. Possible medication side effects/risks/benefits of current regimen.   Importance of medication adherence for medication to be beneficial.   Thank you for involving us  in the care of this patient. If you have any additional questions or concerns, please call [phone] and ask for me or the provider on-call.  TELEPSYCHIATRY ATTESTATION & CONSENT  As the  provider for this telehealth consult, I attest that I verified the patient's identity using two separate identifiers, introduced myself to the patient, provided my credentials, disclosed my location, and performed this encounter via a HIPAA-compliant, real-time, face-to-face, two-way, interactive audio and video platform and with the full consent and agreement of the patient (or guardian as applicable.)  Patient physical location: [facility]. Telehealth provider physical location: home office in state of  [place]  Video start time: 16:18pm pm (Central Time) Video end time: 16:40pm  (Central Time)  IDENTIFYING DATA  Miguel [name] is a 63 y.o. year-old male for whom a psychiatric consultation has been ordered by the primary provider. The patient was identified using two separate identifiers.  CHIEF COMPLAINT/REASON FOR CONSULT  I cut myself with a knife when I argued with my wife  INTERVAL HISTORY  Please refer to initial consult note for full history, assessment, and treatment dated 12/23/23.  This provider also eval this patient 12/08/23, he was transferred to [facility] at that time.   He is currently IVC as he cut self with steak knife after argument with his wife under the context of alcohol intoxication; patient with previous attempts at rehabilitation, having attended multiple programs, with last rehab admission last month.   Based on my follow-up encounter with this patient today, the patient was to be re-eval this morning by psychiatry but his alcohol level remained 96 at 09:36 am.   Patient states feeling great, been up for hours and hours;    He reports wife was talking to her aunt on the phone, he was listening in kitchen, he thought wife was saying who could take care of her if something happened to him as he is her caregiver, he took it as wife was leaving, he had been drinking; reports he cut himself for attention I guess, I knew  it wouldn't kill me, it just happened so fast.  He stated  and I would never ever hurt her, didn't even want her to see it, didn't do it front of her  He talked with wife twice today, he reports she is mostly crying.  I promised her I will never ever do this again, It is the stupid drink  He reports he was at [facility] for 4 days; he reports he was discharged, he didn't request to leave.  He reports he stayed sober 2 days; he has naltrexone  on his oupt med list but he reports he was never given the prescription they talked about it but it wasn't at the pharmacy that I know, so he has not trialed it long term, maybe was provided while in [facility]  Today, client reports mild dysphoric feelings related to his situation; denied anergia, anhedonia, amotivation, mild situational anxiety, and frequent worry, no restlessness, he expresses remorse, guilt, and shame over the events leading up to hospitalization; no reported panic symptoms, no reported obsessive/compulsive behaviors. Client denies active SI/HI ideations, plans or intent. There is no evidence of psychosis or delusional thinking.  Client denied past episodes of hypomania, hyperactivity, erratic/excessive spending, involvement in dangerous activities, self-inflated ego, grandiosity, or promiscuity.  sleeping 5-6hrs/24hrs, appetite increased been eating real good in here concentration it's good Reviewed active outpatient medication list/reviewed labs. Obtained Collateral information from medical record.  He has SW comes to his home from [facility] PCP manages his medication    PERTINENT UPDATES TO PMH, SH/SUDH, FH  None significant since eval yesterday  HOME MEDICATIONS   Current Facility-Administered Medications:    benzonatate  (TESSALON ) capsule 100 mg, 100 mg, Oral, TID PRN, Ward, Kristen N, DO, 100 mg at 12/23/23 9362   ipratropium-albuterol  (DUONEB) 0.5-2.5 (3) MG/3ML nebulizer solution 3 mL, 3 mL, Nebulization, Q2H PRN, Ward, Kristen N, DO   lisinopril  (ZESTRIL ) tablet 40  mg, 40 mg, Oral, Daily, Ward, Kristen N, DO, 40 mg at 12/23/23 9163   LORazepam  (ATIVAN ) injection 0-4 mg, 0-4 mg, Intravenous, Q6H **OR** LORazepam  (ATIVAN ) tablet 0-4 mg, 0-4 mg, Oral, Q6H, Ward, Kristen N, DO, 2 mg at 12/23/23 1105   [START ON 12/25/2023] LORazepam  (ATIVAN ) injection 0-4 mg, 0-4 mg, Intravenous, Q12H **OR** [START ON 12/25/2023] LORazepam  (ATIVAN ) tablet 0-4 mg, 0-4 mg, Oral, Q12H, Ward, Kristen N, DO   metoprolol  succinate (TOPROL -XL) 24 hr tablet 25 mg, 25 mg, Oral, Daily, Ward, Kristen N, DO, 25 mg at 12/23/23 9163   mometasone -formoterol  (DULERA ) 100-5 MCG/ACT inhaler 2 puff, 2 puff, Inhalation, BID, Ward, Josette SAILOR, DO, 2 puff at 12/23/23 0838   [START ON 12/24/2023] predniSONE  (DELTASONE ) tablet 60 mg, 60 mg, Oral, Q breakfast, Ward, Kristen N, DO   thiamine  (VITAMIN B1) tablet 100 mg, 100 mg, Oral, Daily, 100 mg at 12/23/23 0836 **OR** thiamine  (VITAMIN B1) injection 100 mg, 100 mg, Intravenous, Daily, Ward, Josette SAILOR, DO  Current Outpatient Medications:    albuterol  (VENTOLIN  HFA) 108 (90 Base) MCG/ACT inhaler, Inhale 2 puffs into the lungs every 4 (four) hours as needed for wheezing or shortness of breath., Disp: 18 g, Rfl: 1   allopurinol  (ZYLOPRIM ) 100 MG tablet, Take 1 tablet (100 mg total) by mouth daily., Disp: 90 tablet, Rfl: 3   colchicine  0.6 MG tablet, Take 1 tablet (0.6 mg total) by mouth 2 (two) times daily., Disp: 60 tablet, Rfl: 0   diclofenac  Sodium (VOLTAREN ) 1 % GEL, Apply 4 g topically 3 (three) times daily  as needed (left chest wall pain)., Disp: 100 g, Rfl: 0   ferrous sulfate  325 (65 FE) MG tablet, Take 1 tablet (325 mg total) by mouth daily with breakfast., Disp: 30 tablet, Rfl: 1   fluticasone -salmeterol (ADVAIR ) 100-50 MCG/ACT AEPB, Inhale 1 puff into the lungs 2 (two) times daily., Disp: 60 each, Rfl: 11   folic acid  (FOLVITE ) 1 MG tablet, Take 1 tablet (1 mg total) by mouth daily., Disp: 30 tablet, Rfl: 1   lisinopril  (ZESTRIL ) 40 MG tablet, TAKE ONE  TABLET BY MOUTH ONCE EVERY DAY., Disp: 90 tablet, Rfl: 3   metoprolol  succinate (TOPROL  XL) 25 MG 24 hr tablet, Take 1 tablet (25 mg total) by mouth daily., Disp: 90 tablet, Rfl: 3   Multiple Vitamin (MULTIVITAMIN WITH MINERALS) TABS tablet, Take 1 tablet by mouth daily., Disp: , Rfl:    naltrexone  (DEPADE) 50 MG tablet, Take 1 tablet (50 mg total) by mouth daily., Disp: 90 tablet, Rfl: 0   sertraline  (ZOLOFT ) 50 MG tablet, Take 1 tablet (50 mg total) by mouth daily., Disp: 90 tablet, Rfl: 3   thiamine  (VITAMIN B-1) 100 MG tablet, Take 1 tablet (100 mg total) by mouth daily., Disp: 30 tablet, Rfl: 1   traZODone  (DESYREL ) 100 MG tablet, Take 1 tablet (100 mg total) by mouth at bedtime as needed., Disp: 90 tablet, Rfl: 3   ALLERGIES  No Known Allergies  CURRENT MENTAL STATUS EXAM (MSE)  Mental Status Exam: General Appearance: Fairly Groomed  Orientation:  Full (Time, Place, and Person)  Memory:  Immediate;   Good Recent;   Good Remote;   Good  Concentration:  Concentration: Good  Recall:  Good  Attention  Good  Eye Contact:  Good  Speech:  Clear and Coherent  Language:  Good  Volume:  Normal  Mood: remorseful, feel good  Affect:  Appropriate  Thought Process:  Goal Directed  Thought Content:  Logical  Suicidal Thoughts:  No  Homicidal Thoughts:  No  Judgement:  Intact  Insight:  Good  Psychomotor Activity:  Normal  Akathisia:  Negative  Fund of Knowledge:  Good    Assets:  Communication Skills Desire for Improvement Financial Resources/Insurance Housing Social Support  Cognition:  WNL  ADL's:  Intact  AIMS (if indicated):       VITALS  Blood pressure 131/86, pulse 91, temperature 98.2 F (36.8 C), temperature source Oral, resp. rate 16, height 6' (1.829 m), weight 79.4 kg, SpO2 100%.  LABS  Admission on 12/23/2023  Component Date Value Ref Range Status   Sodium 12/23/2023 141  135 - 145 mmol/L Final   Potassium 12/23/2023 3.9  3.5 - 5.1 mmol/L Final   Chloride  12/23/2023 105  98 - 111 mmol/L Final   CO2 12/23/2023 20 (L)  22 - 32 mmol/L Final   Glucose, Bld 12/23/2023 105 (H)  70 - 99 mg/dL Final   Glucose reference range applies only to samples taken after fasting for at least 8 hours.   BUN 12/23/2023 12  8 - 23 mg/dL Final   Creatinine, Ser 12/23/2023 1.04  0.61 - 1.24 mg/dL Final   Calcium  12/23/2023 8.5 (L)  8.9 - 10.3 mg/dL Final   Total Protein 97/93/7974 7.2  6.5 - 8.1 g/dL Final   Albumin  12/23/2023 4.1  3.5 - 5.0 g/dL Final   AST 97/93/7974 24  15 - 41 U/L Final   ALT 12/23/2023 24  0 - 44 U/L Final   Alkaline Phosphatase 12/23/2023 61  38 -  126 U/L Final   Total Bilirubin 12/23/2023 0.8  0.0 - 1.2 mg/dL Final   GFR, Estimated 12/23/2023 >60  >60 mL/min Final   Comment: (NOTE) Calculated using the CKD-EPI Creatinine Equation (2021)    Anion gap 12/23/2023 16 (H)  5 - 15 Final   Performed at [facility], [address] [place], [place] [address]   Alcohol, Ethyl (B) 12/23/2023 269 (H)  <10 mg/dL Final   Comment: (NOTE) Lowest detectable limit for serum alcohol is 10 mg/dL.  For medical purposes only. Performed at [facility], [address] [place], [place] [address]    Salicylate Lvl 12/23/2023 <7.0 (L)  7.0 - 30.0 mg/dL Final   Performed at [facility], [address] [place], [place] [address]   Acetaminophen  (Tylenol ), Serum 12/23/2023 <10 (L)  10 - 30 ug/mL Final   Comment: (NOTE) Therapeutic concentrations vary significantly. A range of 10-30 ug/mL  may be an effective concentration for many patients. However, some  are best treated at concentrations outside of this range. Acetaminophen  concentrations >150 ug/mL at 4 hours after ingestion  and >50 ug/mL at 12 hours after ingestion are often associated with  toxic reactions.  Performed at [facility], [address] [place], [place] [address]    WBC 12/23/2023 7.4  4.0 - 10.5 K/uL Final   RBC 12/23/2023 4.52  4.22 -  5.81 MIL/uL Final   Hemoglobin 12/23/2023 12.8 (L)  13.0 - 17.0 g/dL Final   HCT 97/93/7974 38.7 (L)  39.0 - 52.0 % Final   MCV 12/23/2023 85.6  80.0 - 100.0 fL Final   MCH 12/23/2023 28.3  26.0 - 34.0 pg Final   MCHC 12/23/2023 33.1  30.0 - 36.0 g/dL Final   RDW 97/93/7974 17.5 (H)  11.5 - 15.5 % Final   Platelets 12/23/2023 326  150 - 400 K/uL Final   nRBC 12/23/2023 0.0  0.0 - 0.2 % Final   Performed at [facility], [address] [place], [place] [address]   Tricyclic, Ur Screen 12/23/2023 NONE DETECTED  NONE DETECTED Final   Amphetamines, Ur Screen 12/23/2023 NONE DETECTED  NONE DETECTED Final   MDMA (Ecstasy)Ur Screen 12/23/2023 NONE DETECTED  NONE DETECTED Final   Cocaine Metabolite,Ur [place] 12/23/2023 NONE DETECTED  NONE DETECTED Final   Opiate, Ur Screen 12/23/2023 NONE DETECTED  NONE DETECTED Final   Phencyclidine (PCP) Ur S 12/23/2023 NONE DETECTED  NONE DETECTED Final   Cannabinoid 50 Ng, Ur  12/23/2023 POSITIVE (A)  NONE DETECTED Final   Barbiturates, Ur Screen 12/23/2023 POSITIVE (A)  NONE DETECTED Final   Benzodiazepine, Ur Scrn 12/23/2023 NONE DETECTED  NONE DETECTED Final   Methadone Scn, Ur 12/23/2023 NONE DETECTED  NONE DETECTED Final   Comment: (NOTE) Tricyclics + metabolites, urine    Cutoff 1000 ng/mL Amphetamines + metabolites, urine  Cutoff 1000 ng/mL MDMA (Ecstasy), urine              Cutoff 500 ng/mL Cocaine Metabolite, urine          Cutoff 300 ng/mL Opiate + metabolites, urine        Cutoff 300 ng/mL Phencyclidine (PCP), urine         Cutoff 25 ng/mL Cannabinoid, urine                 Cutoff 50 ng/mL Barbiturates + metabolites, urine  Cutoff 200 ng/mL Benzodiazepine, urine              Cutoff  200 ng/mL Methadone, urine                   Cutoff 300 ng/mL  The urine drug screen provides only a preliminary, unconfirmed analytical test result and should not be used for non-medical purposes. Clinical consideration and professional judgment  should be applied to any positive drug screen result due to possible interfering substances. A more specific alternate chemical method must be used in order to obtain a confirmed analytical result. Gas chromatography / mass spectrometry (GC/MS) is the preferred confirm                          atory method. Performed at [facility], [address] [place], [place] [address]    Troponin I (High Sensitivity) 12/23/2023 5  <18 ng/L Final   Comment: (NOTE) Elevated high sensitivity troponin I (hsTnI) values and significant  changes across serial measurements may suggest ACS but many other  chronic and acute conditions are known to elevate hsTnI results.  Refer to the Links section for chest pain algorithms and additional  guidance. Performed at [facility], [address] [place], [place] [address]    SARS Coronavirus 2 by RT PCR 12/23/2023 NEGATIVE  NEGATIVE Final   Comment: (NOTE) SARS-CoV-2 target nucleic acids are NOT DETECTED.  The SARS-CoV-2 RNA is generally detectable in upper respiratory specimens during the acute phase of infection. The lowest concentration of SARS-CoV-2 viral copies this assay can detect is 138 copies/mL. A negative result does not preclude SARS-Cov-2 infection and should not be used as the sole basis for treatment or other patient management decisions. A negative result may occur with  improper specimen collection/handling, submission of specimen other than nasopharyngeal swab, presence of viral mutation(s) within the areas targeted by this assay, and inadequate number of viral copies(<138 copies/mL). A negative result must be combined with clinical observations, patient history, and epidemiological information. The expected result is Negative.  Fact Sheet for Patients:  bloggercourse.com  Fact Sheet for Healthcare Providers:  seriousbroker.it  This test is no                           t yet approved or cleared by the [place]  FDA and  has been authorized for detection and/or diagnosis of SARS-CoV-2 by FDA under an Emergency Use Authorization (EUA). This EUA will remain  in effect (meaning this test can be used) for the duration of the COVID-19 declaration under Section 564(b)(1) of the Act, 21 U.S.C.section 360bbb-3(b)(1), unless the authorization is terminated  or revoked sooner.       Influenza A by PCR 12/23/2023 NEGATIVE  NEGATIVE Final   Influenza B by PCR 12/23/2023 NEGATIVE  NEGATIVE Final   Comment: (NOTE) The Xpert Xpress SARS-CoV-2/FLU/RSV plus assay is intended as an aid in the diagnosis of influenza from Nasopharyngeal swab specimens and should not be used as a sole basis for treatment. Nasal washings and aspirates are unacceptable for Xpert Xpress SARS-CoV-2/FLU/RSV testing.  Fact Sheet for Patients: bloggercourse.com  Fact Sheet for Healthcare Providers: seriousbroker.it  This test is not yet approved or cleared by the [place]  FDA and has been authorized for detection and/or diagnosis of SARS-CoV-2 by FDA under an Emergency Use Authorization (EUA). This EUA will remain in effect (meaning this test can be used) for the duration of the COVID-19 declaration under Section 564(b)(1) of the Act, 21 U.S.C. section 360bbb-3(b)(1), unless  the authorization is terminated or revoked.     Resp Syncytial Virus by PCR 12/23/2023 NEGATIVE  NEGATIVE Final   Comment: (NOTE) Fact Sheet for Patients: bloggercourse.com  Fact Sheet for Healthcare Providers: seriousbroker.it  This test is not yet approved or cleared by the [place]  FDA and has been authorized for detection and/or diagnosis of SARS-CoV-2 by FDA under an Emergency Use Authorization (EUA). This EUA will remain in effect (meaning this test can be used) for the duration of  the COVID-19 declaration under Section 564(b)(1) of the Act, 21 U.S.C. section 360bbb-3(b)(1), unless the authorization is terminated or revoked.  Performed at [facility], [address] [place], [place] [address]    Alcohol, Ethyl (B) 12/23/2023 93 (H)  <10 mg/dL Final   Comment: (NOTE) Lowest detectable limit for serum alcohol is 10 mg/dL.  For medical purposes only. Performed at [facility], [address] [place], [place] [address]     PSYCHIATRIC REVIEW OF SYSTEMS (ROS)  Depression:      []  Denies all symptoms of depression [] Depressed mood       [] Insomnia/hypersomnia              [] Fatigue        [] Change in appetite     [] Anhedonia                                [] Difficulty concentrating      [] Hopelessness             [x] Worthlessness [x] Guilt/shame                [] Psychomotor agitation/retardation   Mania:     [x] Denies all symptoms of mania [] Elevated mood           [] Irritability         [] Pressured speech         []  Grandiosity         []  Decreased need for sleep                                                 [] Increased energy          []  Increase in goal directed activity                                       [] Flight of ideas    []  Excessive involvement in high-risk behaviors                   []  Distractibility     Psychosis:     [x] Denies all symptoms of psychosis [] Paranoia         []  Auditory Hallucinations          [] Visual hallucinations         [] ELOC        [] IOR                [] Delusions   Suicide:    [x]  Denies SI/plan/intent []  Passive SI         []   Active SI         [] Plan           [] Intent  Homicide:  [x]   Denies HI/plan/intent []  Passive HI         []  Active HI         [] Plan            [] Intent           [] Identified Target    Additional findings:      Musculoskeletal: No abnormal movements observed      Gait & Station: Normal  RISK FORMULATION/ASSESSMENT  Is the patient experiencing any suicidal or homicidal  ideations: No       Explain if yes:  Protective factors considered for safety management:   Absence of psychosis Access to adequate health care Advice& help seeking Resourcefulness/Survival skills Sense of responsibility Positive social support Positive therapeutic relationship Future oriented Suicide Inquiry:  Denies suicidal ideations, intentions, or plans.  Denies  recent self-harm behavior. Talks futuristically.  Risk factors/concerns considered for safety management:  Prior attempt Substance abuse/dependence Physical illness/chronic pain Access to lethal means Age over 51 Impulsivity Male gender  Is there a safety management plan with the patient and treatment team to minimize risk factors and promote protective factors: Yes           Explain: safety obs Is crisis care placement or psychiatric hospitalization recommended: No     Based on my current evaluation and risk assessment, patient is determined at this time to be at:   Low risk  Global Suicide Risk Assessment: The client is found to be at  current Low risk of suicide or violence; however, risk lethality increased under context of drugs/alcohol. Encourage to abstain; Therefore inpt psych admission is not recommended; he declined offer of substance use rehab.  At the time of disposition, the patient adamantly denies SI/HI ideations, plans or intent and has the ability to determine right from wrong, and can anticipate the potential consequences of behaviors and actions.He is a low risk for self-harm based on his/her current clinical presentation and his risk and protective factors.   *RISK ASSESSMENT Risk assessment is a dynamic process; it is possible that this patient's condition, and risk level, may change. This should be re-evaluated and managed over time as appropriate. Please re-consult psychiatric consult services if additional assistance is needed in terms of risk assessment and management. If your team decides to  discharge this patient, please advise the patient how to best access emergency psychiatric services, or to call 911, if their condition worsens or they feel unsafe in any way.  Total time spent in this encounter was 30 minutes with greater than 50% of time spent in counseling and coordination of care.     Dr. Brunetta JUDITHANN Ada, PhD, MSN, APRN, PMHNP-BC, MC[facility] Christinamarie Tall  KANDICE Ada, NP Telepsychiatry Consult Services

## 2023-12-23 NOTE — Discharge Instructions (Signed)
 Please contact one of the resources below for assistance with substance use.   ntensive Outpatient Programs   [facility] [address] [place],  [place], [place] [phone]  [facility] (Inpatient and outpatient)[phone] (Suboxone and Methadone) [address] [phone]  [facility]: [facility] - Intensive Outpatient [address] [place], [place] [address]  [phone]  [facility] (Outpatient, Inpatient, [facility] (Groups and Residental) (insurance only) [phone] [place], [place] [phone]   [facility] (for caregivers and family) [address] [place] [address] [place], [place] [phone]  Residential Treatment Programs  [facility](2 years) Residential: 88 days)[facility] [address] [place], [place], [place] [phone] or [phone]  [facility] [address] [place], [place], [place] [phone]  [facility] ([facility] [address] [place], [place], [place] [phone] Admissions: 8am-3pm M-F  [facility] Program: Residential Program [phone] Days)             [facility]: [facility]  [place], [place], [place]  [phone] or [phone] in Hours over the weekend or by referral)  [facility] [address] [place], [place] [address] [phone] (Do virtual or phone assessment, offer transportation within 25 miles, have in patient and Outpatient  options)   [facility]: [facility]:[phone] (for crisis response 24 hours a day)

## 2023-12-23 NOTE — ED Notes (Signed)
 ivc prior to arrival/psych consult ordered/pending.Marland KitchenMarland Kitchen

## 2023-12-23 NOTE — TOC CM/SW Note (Signed)
 CSW provided SU resources to AVS; consult is complete. No additional TOC needs at this time.

## 2023-12-23 NOTE — ED Notes (Signed)
 IVC PAPERS  RESCINDED PER  DR  Doree Games  MD  INFORMED  ANNIE  RN

## 2023-12-23 NOTE — ED Triage Notes (Addendum)
 Pt to ED IVCd via [place] [employer]. Per IVC paperwork pt cut arm with steak knife after having argument with wife. Pt reportedly said he would rather die at home than at hospital. Pt was here yesterday for [facility] but left after triage. Denies SI at this time. Has cut to left arm. No bleeding. Had a couple of shots last night, denies drug use

## 2023-12-23 NOTE — Consult Note (Signed)
 [facility] Consult Note  Patient Name: Miguel [name] MRN: [id number] DOB: 1961-05-17 DATE OF Consult: 12/23/2023  PRIMARY PSYCHIATRIC DIAGNOSES  - Alcohol use disorder - Self-harm behavior - Adjustment disorder with mixed disturbance of emotions and conduct  RECOMMENDATIONS  Recommend getting another alcohol level and have patient be re-evaluated by psych in the morning, to make sure there is an appropriate treatment plan for the patient and his wife who has MS and can't care for herself. The wife's aunt is 90 years old and can't care for her either. Initiate case management, department of aging and  social services.  Continue CIWA protocol.  Communication: Treatment team members (and family members if applicable) who were involved in treatment/care discussions and planning, and with whom we spoke or engaged with via secure text/chat, include the following:  patient's treatment team.  Thank you for involving us  in the care of this patient. If you have any additional questions or concerns, please call [phone] and ask for me or the provider on-call.  TELEPSYCHIATRY ATTESTATION & CONSENT  As the provider for this telehealth consult, I attest that I verified the patient's identity using two separate identifiers, introduced myself to the patient, provided my credentials, disclosed my location, and performed this encounter via a HIPAA-compliant, real-time, face-to-face, two-way, interactive audio and video platform and with the full consent and agreement of the patient (or guardian as applicable.)  Patient physical location: [place]. Telehealth provider physical location: home office in state of [place].  Video start time: 0422 (Central Time) Video end time: 0449 (Central Time)  IDENTIFYING DATA  Miguel [name] is a 63 y.o. year-old male for whom a psychiatric consultation has been ordered by the primary provider. The patient was identified using two separate identifiers.  CHIEF  COMPLAINT/REASON FOR CONSULT  - I wasn't thinking to kill myself. I know that wouldn't kill me. - Argument with wife led to self-harm - Alcohol consumption: 3-4 shots - Concern about wife's potential departure  HISTORY OF PRESENT ILLNESS (HPI)  The patient, a 63 year old male, presented following an incident involving self-inflicted lacerations to his arm. Upon inquiry, it was revealed that the event occurred after a heated argument with his wife, which he struggled to recall in detail. The patient reports that the argument, described as severe, took place approximately five to six hours prior to this evaluation. The patient expressed uncertainty about the specifics of the disagreement, suggesting that both parties might have misunderstood each other. He mentioned that his wife might have said something along the lines of get out of my life, which could have been a trigger for his actions.  It was noted that the patient had consumed a significant amount of alcohol and his BAL on arrival to the ER was 269. Patient reports that he could not recall the exact amount of alcohol that he drank but admitted to drinking every few days, approximately three to four times a week, and acknowledged having an issue with alcohol. The patient confirmed previous attempts at rehabilitation, having attended multiple programs, with last rehab admission last month.   The patient denied any suicidal intent during the current incident, stating that he was not thinking clearly at the time and did not believe the act would be fatal. He expressed confusion about his behavior, indicating a pattern of self-harm when distressed.  During the evaluation, the patient's wife was contacted to provide additional context. She confirmed that the patient had a history of self-harm and alcohol abuse. But reports that  she didnt know he had been drinking . She reports came in the house went to the kitchen then came to the den where she was  sitting showing her his hand. She reports getting scared when she saw the blood and called 911.  Wife reports that the argument was reportedly centered around the patient's fear of abandonment, as he believed his wife was planning to leave him. She clarified that discussions with her aunt were about contingency plans for her care during his episodes of intoxication, not about leaving him.  It was recommended that the patient remain in the hospital until sober, with plans for reevaluation upon regaining sobriety. The importance of addressing his alcohol use and its impact on his mental health and relationship was emphasized. The patient was encouraged to consider the consequences of his actions and the necessity of seeking help to manage his alcohol dependency and emotional well-being. Recommend getting another alcohol level and have patient be re-evaluated by psych in the morning, to make sure there is an appropriate treatment plan for the patient and his wife who has MS and can't care for herself. The wife's aunt is 71 years old and can't care for her either. Initiate case management, department of aging and  social services.  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    OCD (obsessive compulsive disorder)    Renal colic      HOME MEDICATIONS  Facility Ordered Medications  Medication   [COMPLETED] ipratropium-albuterol  (DUONEB) 0.5-2.5 (3) MG/3ML nebulizer solution 3 mL   [COMPLETED] predniSONE  (DELTASONE ) tablet 60 mg   LORazepam  (ATIVAN ) injection 0-4 mg   Or   LORazepam   (ATIVAN ) tablet 0-4 mg   [START ON 12/25/2023] LORazepam  (ATIVAN ) injection 0-4 mg   Or   [START ON 12/25/2023] LORazepam  (ATIVAN ) tablet 0-4 mg   thiamine  (VITAMIN B1) tablet 100 mg   Or   thiamine  (VITAMIN B1) injection 100 mg   [COMPLETED] ipratropium-albuterol  (DUONEB) 0.5-2.5 (3) MG/3ML nebulizer solution 3 mL   PTA Medications  Medication Sig   albuterol  (PROAIR  HFA) 108 (90 Base) MCG/ACT inhaler Inhale 1-2 puffs into the lungs every 4 (four) hours as needed for wheezing or shortness of breath.   allopurinol  (ZYLOPRIM ) 100 MG tablet Take 1 tablet (100 mg total) by mouth daily.   sertraline  (ZOLOFT ) 50 MG tablet Take 1 tablet (50 mg total) by mouth daily.   metoprolol  succinate (TOPROL  XL) 25 MG 24 hr tablet Take 1 tablet (25 mg total) by mouth daily.   lisinopril  (ZESTRIL ) 40 MG tablet TAKE ONE TABLET BY MOUTH ONCE EVERY DAY.   traZODone  (DESYREL ) 100 MG tablet Take 1 tablet (100 mg total) by mouth at bedtime as needed.   fluticasone -salmeterol (ADVAIR ) 100-50 MCG/ACT AEPB Inhale 1 puff into the lungs 2 (two) times daily.   colchicine  0.6 MG tablet Take 1 tablet (0.6 mg total) by mouth 2 (two) times daily.   ferrous sulfate  325 (65 FE) MG tablet Take 1 tablet (325  mg total) by mouth daily with breakfast.   folic acid  (FOLVITE ) 1 MG tablet Take 1 tablet (1 mg total) by mouth daily.   HYDROcodone -acetaminophen  (NORCO/VICODIN) 5-325 MG tablet Take 1-2 tablets by mouth every 6 (six) hours as needed for moderate pain or severe pain. (Patient not taking: Reported on 06/18/2023)   Multiple Vitamin (MULTIVITAMIN WITH MINERALS) TABS tablet Take 1 tablet by mouth daily.   thiamine  (VITAMIN B-1) 100 MG tablet Take 1 tablet (100 mg total) by mouth daily.   naltrexone  (DEPADE) 50 MG tablet Take 1 tablet (50 mg total) by mouth daily.   methylPREDNISolone  (MEDROL  DOSEPAK) 4 MG TBPK tablet 6 day taper; take as directed on package instructions   diclofenac  Sodium (VOLTAREN ) 1 % GEL Apply 4 g topically 3  (three) times daily as needed (left chest wall pain).   albuterol  (VENTOLIN  HFA) 108 (90 Base) MCG/ACT inhaler Inhale 2 puffs into the lungs every 4 (four) hours as needed for wheezing or shortness of breath.   traMADol  (ULTRAM ) 50 MG tablet Take 1 tablet (50 mg total) by mouth every 6 (six) hours as needed.     ALLERGIES  No Known Allergies  SOCIAL & SUBSTANCE USE HISTORY  Social History   Socioeconomic History   Marital status: Married    Spouse name: Not on file   Number of children: Not on file   Years of education: Not on file   Highest education level: Not on file  Occupational History   Not on file  Tobacco Use   Smoking status: Former    Current packs/day: 0.00    Types: Cigarettes    Quit date: 03/19/1981    Years since quitting: 42.7   Smokeless tobacco: Never  Vaping Use   Vaping status: Never Used  Substance and Sexual Activity   Alcohol use: Yes    Alcohol/week: 24.0 standard drinks of alcohol    Types: 24 Shots of liquor per week    Comment: lasst used 8 days ago   Drug use: Not Currently    Types: Marijuana    Comment: last use 09/2021   Sexual activity: Not Currently  Other Topics Concern   Not on file  Social History Narrative   Lives at home with his wife and takes care of his wife. Independent at baseline      - Biggest strain is financial but doesn't think they could got get more help.      Patient expressed interest in possible financial assistance. Informed written consent obtained   Social Drivers of Health   Financial Resource Strain: High Risk (09/18/2020)   Overall Financial Resource Strain (CARDIA)    Difficulty of Paying Living Expenses: Very hard  Food Insecurity: No Food Insecurity (05/24/2023)   Hunger Vital Sign    Worried About Running Out of Food in the Last Year: Never true    Ran Out of Food in the Last Year: Never true  Transportation Needs: No Transportation Needs (05/24/2023)   PRAPARE - Administrator, Civil Service  (Medical): No    Lack of Transportation (Non-Medical): No  Physical Activity: Sufficiently Active (09/18/2020)   Exercise Vital Sign    Days of Exercise per Week: 7 days    Minutes of Exercise per Session: 60 min  Stress: Stress Concern Present (09/18/2020)   [employer] of Occupational Health - Occupational Stress Questionnaire    Feeling of Stress : Very much  Social Connections: Socially Isolated (09/18/2020)   Social Connection and  Isolation Panel [NHANES]    Frequency of Communication with Friends and Family: Once a week    Frequency of Social Gatherings with Friends and Family: Once a week    Attends Religious Services: Never    Database Administrator or Organizations: No    Attends Engineer, Structural: Never    Marital Status: Married   Social History   Tobacco Use  Smoking Status Former   Current packs/day: 0.00   Types: Cigarettes   Quit date: 03/19/1981   Years since quitting: 42.7  Smokeless Tobacco Never   Social History   Substance and Sexual Activity  Alcohol Use Yes   Alcohol/week: 24.0 standard drinks of alcohol   Types: 24 Shots of liquor per week   Comment: lasst used 8 days ago   Social History   Substance and Sexual Activity  Drug Use Not Currently   Types: Marijuana   Comment: last use 09/2021    Additional pertinent information .  FAMILY HISTORY  Family History  Problem Relation Age of Onset   Alcohol abuse Father    Breast cancer Mother 49   Anxiety disorder Brother    Other Maternal Grandmother        unknown medical history   Other Maternal Grandfather        unknow medical history   Other Paternal Grandmother        unknown medical history   Other Paternal Grandfather        unknown medical history   Healthy Brother    Family Psychiatric History (if known):    MENTAL STATUS EXAM (MSE)  Mental Status Exam: General Appearance: Disheveled  Orientation:  alert and awake, oriented to person and place  Memory:   impaired  due to alcohol intoxication  Concentration:  impaired due to alcohol intoxication  Recall:  impaired due to alcohol intoxication  Attention  Poor  Eye Contact:  Fair  Speech:  Garbled  Language:  impaired due to alcohol intoxication  Volume:  Normal  Mood: sad  Affect:  Tearful  Thought Process:  Disorganized  Thought Content:    Suicidal Thoughts:  No  Homicidal Thoughts:  No  Judgement:  impaired due to alcohol intoxication  Insight:  impaired due to alcohol intoxication  Psychomotor Activity:  Restlessness  Akathisia:    Fund of Knowledge:  impaired due to alcohol intoxication    Assets:  Housing  Cognition:  impaired due to alcohol intoxication  ADL's:  Intact  AIMS (if indicated):       VITALS  Blood pressure 136/84, pulse (!) 110, temperature 98.6 F (37 C), temperature source Oral, resp. rate 18, height 6' (1.829 m), weight 79.4 kg, SpO2 97%.  LABS  Admission on 12/23/2023  Component Date Value Ref Range Status   Sodium 12/23/2023 141  135 - 145 mmol/L Final   Potassium 12/23/2023 3.9  3.5 - 5.1 mmol/L Final   Chloride 12/23/2023 105  98 - 111 mmol/L Final   CO2 12/23/2023 20 (L)  22 - 32 mmol/L Final   Glucose, Bld 12/23/2023 105 (H)  70 - 99 mg/dL Final   Glucose reference range applies only to samples taken after fasting for at least 8 hours.   BUN 12/23/2023 12  8 - 23 mg/dL Final   Creatinine, Ser 12/23/2023 1.04  0.61 - 1.24 mg/dL Final   Calcium  12/23/2023 8.5 (L)  8.9 - 10.3 mg/dL Final   Total Protein 97/93/7974 7.2  6.5 - 8.1 g/dL Final  Albumin  12/23/2023 4.1  3.5 - 5.0 g/dL Final   AST 97/93/7974 24  15 - 41 U/L Final   ALT 12/23/2023 24  0 - 44 U/L Final   Alkaline Phosphatase 12/23/2023 61  38 - 126 U/L Final   Total Bilirubin 12/23/2023 0.8  0.0 - 1.2 mg/dL Final   GFR, Estimated 12/23/2023 >60  >60 mL/min Final   Comment: (NOTE) Calculated using the CKD-EPI Creatinine Equation (2021)    Anion gap 12/23/2023 16 (H)  5 - 15 Final   Performed at  [facility], [address] [place], [place] [address]   Alcohol, Ethyl (B) 12/23/2023 269 (H)  <10 mg/dL Final   Comment: (NOTE) Lowest detectable limit for serum alcohol is 10 mg/dL.  For medical purposes only. Performed at [facility], [address] [place], [place] [address]    Salicylate Lvl 12/23/2023 <7.0 (L)  7.0 - 30.0 mg/dL Final   Performed at [facility], [address] [place], [place] [address]   Acetaminophen  (Tylenol ), Serum 12/23/2023 <10 (L)  10 - 30 ug/mL Final   Comment: (NOTE) Therapeutic concentrations vary significantly. A range of 10-30 ug/mL  may be an effective concentration for many patients. However, some  are best treated at concentrations outside of this range. Acetaminophen  concentrations >150 ug/mL at 4 hours after ingestion  and >50 ug/mL at 12 hours after ingestion are often associated with  toxic reactions.  Performed at [facility], [address] [place], [place] [address]    WBC 12/23/2023 7.4  4.0 - 10.5 K/uL Final   RBC 12/23/2023 4.52  4.22 - 5.81 MIL/uL Final   Hemoglobin 12/23/2023 12.8 (L)  13.0 - 17.0 g/dL Final   HCT 97/93/7974 38.7 (L)  39.0 - 52.0 % Final   MCV 12/23/2023 85.6  80.0 - 100.0 fL Final   MCH 12/23/2023 28.3  26.0 - 34.0 pg Final   MCHC 12/23/2023 33.1  30.0 - 36.0 g/dL Final   RDW 97/93/7974 17.5 (H)  11.5 - 15.5 % Final   Platelets 12/23/2023 326  150 - 400 K/uL Final   nRBC 12/23/2023 0.0  0.0 - 0.2 % Final   Performed at [facility], [address] [place], [place] [address]   Troponin I (High Sensitivity) 12/23/2023 5  <18 ng/L Final   Comment: (NOTE) Elevated high sensitivity troponin I (hsTnI) values and significant  changes across serial measurements may suggest ACS but many other  chronic and acute conditions are known to elevate hsTnI results.  Refer to the Links section for chest pain algorithms and additional  guidance. Performed at [facility], [address] [place], [place] [address]    SARS Coronavirus 2 by RT PCR 12/23/2023 NEGATIVE  NEGATIVE Final   Comment: (NOTE) SARS-CoV-2 target nucleic acids are NOT DETECTED.  The SARS-CoV-2 RNA is generally detectable in upper respiratory specimens during the acute phase of infection. The lowest concentration of SARS-CoV-2 viral copies this assay can detect is 138 copies/mL. A negative result does not preclude SARS-Cov-2 infection and should not be used as the sole basis for treatment or other patient management decisions. A negative result may occur with  improper specimen collection/handling, submission of specimen other than nasopharyngeal swab, presence of viral mutation(s) within the areas targeted by this assay, and inadequate number of viral copies(<138 copies/mL). A negative result must be combined with clinical observations, patient history, and epidemiological information. The expected result is Negative.  Fact Sheet for  Patients:  bloggercourse.com  Fact Sheet for Healthcare Providers:  seriousbroker.it  This test is no                          t yet approved or cleared by the [place]  FDA and  has been authorized for detection and/or diagnosis of SARS-CoV-2 by FDA under an Emergency Use Authorization (EUA). This EUA will remain  in effect (meaning this test can be used) for the duration of the COVID-19 declaration under Section 564(b)(1) of the Act, 21 U.S.C.section 360bbb-3(b)(1), unless the authorization is terminated  or revoked sooner.       Influenza A by PCR 12/23/2023 NEGATIVE  NEGATIVE Final   Influenza B by PCR 12/23/2023 NEGATIVE  NEGATIVE Final   Comment: (NOTE) The Xpert Xpress SARS-CoV-2/FLU/RSV plus assay is intended as an aid in the diagnosis of influenza from Nasopharyngeal swab specimens and should not be used as a sole basis for treatment. Nasal washings and aspirates are  unacceptable for Xpert Xpress SARS-CoV-2/FLU/RSV testing.  Fact Sheet for Patients: bloggercourse.com  Fact Sheet for Healthcare Providers: seriousbroker.it  This test is not yet approved or cleared by the [place]  FDA and has been authorized for detection and/or diagnosis of SARS-CoV-2 by FDA under an Emergency Use Authorization (EUA). This EUA will remain in effect (meaning this test can be used) for the duration of the COVID-19 declaration under Section 564(b)(1) of the Act, 21 U.S.C. section 360bbb-3(b)(1), unless the authorization is terminated or revoked.     Resp Syncytial Virus by PCR 12/23/2023 NEGATIVE  NEGATIVE Final   Comment: (NOTE) Fact Sheet for Patients: bloggercourse.com  Fact Sheet for Healthcare Providers: seriousbroker.it  This test is not yet approved or cleared by the [place]  FDA and has been authorized for detection and/or diagnosis of SARS-CoV-2 by FDA under an Emergency Use Authorization (EUA). This EUA will remain in effect (meaning this test can be used) for the duration of the COVID-19 declaration under Section 564(b)(1) of the Act, 21 U.S.C. section 360bbb-3(b)(1), unless the authorization is terminated or revoked.  Performed at [facility], [address] [place] [address] [place], [place] [address]     PSYCHIATRIC REVIEW OF SYSTEMS (ROS)  ROS: Notable for the following relevant positive findings: ROS  Additional findings:      Musculoskeletal: No abnormal movements observed      Gait & Station: Laying/Sitting      Pain Screening: Denies      Nutrition & Dental Concerns: none reported  RISK FORMULATION/ASSESSMENT  Is the patient experiencing any suicidal or homicidal ideations: No       Explain if yes:  Protective factors considered for safety management: supportive wife, access to appropriate clinical intervention  Risk  factors/concerns considered for safety management:  Depression Substance abuse/dependence Age over 71 Male gender  Is there a safety management plan with the patient and treatment team to minimize risk factors and promote protective factors: Yes           Explain: Recommend getting another alcohol level and have patient be re-evaluated by psych in the morning, to make sure there is an appropriate treatment plan for the patient and his wife who has MS and can't care for herself. The wife's aunt is 48 years old and can't care for her either. Initiate case management, department of aging and  social services. Is crisis care placement or psychiatric hospitalization recommended: Deferred     Based on my current evaluation and  risk assessment, patient is determined at this time to be at:  Moderate Risk  *RISK ASSESSMENT Risk assessment is a dynamic process; it is possible that this patient's condition, and risk level, may change. This should be re-evaluated and managed over time as appropriate. Please re-consult psychiatric consult services if additional assistance is needed in terms of risk assessment and management. If your team decides to discharge this patient, please advise the patient how to best access emergency psychiatric services, or to call 911, if their condition worsens or they feel unsafe in any way.   Ines Hock, NP Telepsychiatry Consult Services

## 2023-12-23 NOTE — ED Notes (Signed)
 ivc/re-evaluate by psych in the am.

## 2023-12-23 NOTE — BH Assessment (Signed)
 Comprehensive Clinical Assessment (CCA) Note  12/23/2023 Miguel [name] [id number]  Chief Complaint: Patient is a 63 year old male presenting to [facility] ED under IVC. Per triage note Pt to ED IVCd via [place] [employer]. Per IVC paperwork pt cut arm with steak knife after having argument with wife. Pt reportedly said he would rather die at home than at hospital. Pt was here yesterday for [facility] but left after triage. Denies SI at this time. Has cut to left arm. No bleeding. Had a couple of shots last night, denies drug use. During assessment patient appears alert and oriented x3, calm and cooperative, appears to be under the influence. Patient reports not recalling taking a knife to his arm but is remorseful when I drink I do stupid stuff like this. Patient has presented to this ED numerous amounts of time in the past with similar presentation and was recently hospitalized at [facility] on 12/08/23 and was discharged the following Thursday. This clinical research associate asked if the patient found it helpful being there but the patient denies and also reports that he was not discharged with any medications. Patient reports drinking last night 12/22/23 about 1/2 pint and continues to report that he will stop drinking so that he can continue to take care of his wife. Patient denies current SI/HI/AH/VH.  Per Psyc NP Sherifat Velna patient to be reassessed  Chief Complaint  Patient presents with   Psychiatric Evaluation   IVC   Visit Diagnosis: Alcohol use disorder, severe. Substance-induced mood disorder    CCA Screening, Triage and Referral (STR)  Patient Reported Information How did you hear about us ? Legal System  Referral name: No data recorded Referral phone number: No data recorded  Whom do you see for routine medical problems? No data recorded Practice/Facility Name: No data recorded Practice/Facility Phone Number: No data recorded Name of Contact: No data recorded Contact Number: No data  recorded Contact Fax Number: No data recorded Prescriber Name: No data recorded Prescriber Address (if known): No data recorded  What Is the Reason for Your Visit/Call Today? Pt to ED IVCd via [place] [employer]. Per IVC paperwork pt cut arm with steak knife after having argument with wife. Pt reportedly said he would rather die at home than at hospital. Pt was here yesterday for [facility] but left after triage. Denies SI at this time. Has cut to left arm. No bleeding. Had a couple of shots last night, denies drug use  How Long Has This Been Causing You Problems? > than 6 months  What Do You Feel Would Help You the Most Today? Stress Management   Have You Recently Been in Any Inpatient Treatment (Hospital/Detox/Crisis Center/28-Day Program)? No data recorded Name/Location of Program/Hospital:No data recorded How Long Were You There? No data recorded When Were You Discharged? No data recorded  Have You Ever Received Services From [facility] Before? No data recorded Who Do You See at [facility]? No data recorded  Have You Recently Had Any Thoughts About Hurting Yourself? Yes  Are You Planning to Commit Suicide/Harm Yourself At This time? No   Have you Recently Had Thoughts About Hurting Someone [name]? No  Explanation: n/a   Have You Used Any Alcohol or Drugs in the Past 24 Hours? Yes  How Long Ago Did You Use Drugs or Alcohol? No data recorded What Did You Use and How Much? Alcohol a pint   Do You Currently Have a Therapist/Psychiatrist? No  Name of Therapist/Psychiatrist: [facility]   Have You Been  Recently Discharged From Any Public Relations Account Executive or Programs? No  Explanation of Discharge From Practice/Program: n/a     CCA Screening Triage Referral Assessment Type of Contact: Face-to-Face  Is this Initial or Reassessment? No data recorded Date Telepsych consult ordered in [facility]:  No data recorded Time Telepsych consult ordered in [facility]:  No data recorded  Patient Reported Information  Reviewed? No data recorded Patient Left Without Being Seen? No data recorded Reason for Not Completing Assessment: No data recorded  Collateral Involvement: None provided   Does Patient Have a Court Appointed Legal Guardian? No data recorded Name and Contact of Legal Guardian: No data recorded If Minor and Not Living with Parent(s), Who has Custody? n/a  Is CPS involved or ever been involved? Never  Is APS involved or ever been involved? Never   Patient Determined To Be At Risk for Harm To Self or Others Based on Review of Patient Reported Information or Presenting Complaint? Yes, for Self-Harm  Method: No Plan  Availability of Means: No access or NA  Intent: Vague intent or NA  Notification Required: No need or identified person  Additional Information for Danger to Others Potential: -- (n/a)  Additional Comments for Danger to Others Potential: n/a  Are There Guns or Other Weapons in Your Home? Yes  Types of Guns/Weapons: Patient had access to a knife  Are These Weapons Safely Secured?                            -- (n/a)  Who Could Verify You Are Able To Have These Secured: n/a  Do You Have any Outstanding Charges, Pending Court Dates, Parole/Probation? None reported  Contacted To Inform of Risk of Harm To Self or Others: Other: Comment (n/a)   Location of Assessment: [facility] ED   Does Patient Present under Involuntary Commitment? Yes  IVC Papers Initial File Date: No data recorded  [place] of Residence: [place]   Patient Currently Receiving the Following Services: Medication Management   Determination of Need: Emergent (2 hours)   Options For Referral: ED Visit     CCA Biopsychosocial Intake/Chief Complaint:  No data recorded Current Symptoms/Problems: No data recorded  Patient Reported Schizophrenia/Schizoaffective Diagnosis in Past: No   Strengths: Patient is able to communicate his needs  Preferences: No data recorded Abilities: No data  recorded  Type of Services Patient Feels are Needed: No data recorded  Initial Clinical Notes/Concerns: No data recorded  Mental Health Symptoms Depression:  Change in energy/activity; Fatigue; Hopelessness; Difficulty Concentrating   Duration of Depressive symptoms: Greater than two weeks   Mania:  None   Anxiety:   Fatigue; Restlessness; Difficulty concentrating; Worrying   Psychosis:  None   Duration of Psychotic symptoms: No data recorded  Trauma:  None   Obsessions:  None   Compulsions:  Poor Insight; Repeated behaviors/mental acts   Inattention:  None   Hyperactivity/Impulsivity:  None   Oppositional/Defiant Behaviors:  None   Emotional Irregularity:  Recurrent suicidal behaviors/gestures/threats; Unstable self-image   Other Mood/Personality Symptoms:  No data recorded   Mental Status Exam Appearance and self-care  Stature:  Average   Weight:  Overweight   Clothing:  Disheveled   Grooming:  Neglected   Cosmetic use:  None   Posture/gait:  Normal   Motor activity:  Not Remarkable   Sensorium  Attention:  Normal   Concentration:  Normal   Orientation:  X5   Recall/memory:  Normal   Affect  and Mood  Affect:  Appropriate   Mood:  Anxious; Depressed   Relating  Eye contact:  Normal   Facial expression:  Responsive   Attitude toward examiner:  Cooperative   Thought and Language  Speech flow: Slurred   Thought content:  Appropriate to Mood and Circumstances   Preoccupation:  None   Hallucinations:  None   Organization:  No data recorded  [employer] of Knowledge:  Fair   Intelligence:  Average   Abstraction:  Normal   Judgement:  Fair   Dance Movement Psychotherapist:  Adequate   Insight:  Lacking; Poor; Denial   Decision Making:  Impulsive   Social Functioning  Social Maturity:  Impulsive   Social Judgement:  Heedless   Stress  Stressors:  Relationship; Transitions; Financial; Family conflict   Coping Ability:   Exhausted; Overwhelmed; Deficient supports   Skill Deficits:  None   Supports:  Support needed     Religion: Religion/Spirituality Are You A Religious Person?: No  Leisure/Recreation: Leisure / Recreation Do You Have Hobbies?: No  Exercise/Diet: Exercise/Diet Do You Exercise?: No Have You Gained or Lost A Significant Amount of Weight in the Past Six Months?: No Do You Follow a Special Diet?: No Do You Have Any Trouble Sleeping?: No   CCA Employment/Education Employment/Work Situation: Employment / Work Situation Employment Situation: Unemployed Patient's Job has Been Impacted by Current Illness: No Has Patient ever Been in Equities Trader?: No  Education: Education Is Patient Currently Attending School?: No Did You Have An Individualized Education Program (IIEP): No Did You Have Any Difficulty At [employer]?: No Patient's Education Has Been Impacted by Current Illness: No   CCA Family/Childhood History Family and Relationship History: Family history Marital status: Married Number of Years Married: 42 What types of issues is patient dealing with in the relationship?: Patient's wife is tired of patient's drinking habits Additional relationship information: None reported Does patient have children?: No  Childhood History:  Childhood History By whom was/is the patient raised?: Both parents Did patient suffer any verbal/emotional/physical/sexual abuse as a child?: No Did patient suffer from severe childhood neglect?: No Has patient ever been sexually abused/assaulted/raped as an adolescent or adult?: No Was the patient ever a victim of a crime or a disaster?: No Witnessed domestic violence?: No Has patient been affected by domestic violence as an adult?: No  Child/Adolescent Assessment:     CCA Substance Use Alcohol/Drug Use: Alcohol / Drug Use Pain Medications: see mar Prescriptions: see mar Over the Counter: see mar History of alcohol / drug use?:  Yes Negative Consequences of Use: Personal relationships Withdrawal Symptoms: Change in blood pressure, Tingling, Tremors, Weakness, Patient aware of relationship between substance abuse and physical/medical complications Substance #1 Name of Substance 1: Alcohol 1 - Amount (size/oz): 1/2 pint-1 pint 1 - Frequency: Patient drinks almost daily 1 - Last Use / Amount: 12/22/23 1 - Method of Aquiring: purchasing 1- Route of Use: oral                       ASAM's:  Six Dimensions of Multidimensional Assessment  Dimension 1:  Acute Intoxication and/or Withdrawal Potential:      Dimension 2:  Biomedical Conditions and Complications:      Dimension 3:  Emotional, Behavioral, or Cognitive Conditions and Complications:     Dimension 4:  Readiness to Change:     Dimension 5:  Relapse, Continued use, or Continued Problem Potential:     Dimension 6:  Recovery/Living Environment:     ASAM Severity Score:    ASAM Recommended Level of Treatment: ASAM Recommended Level of Treatment: Level III Residential Treatment   Substance use Disorder (SUD) Substance Use Disorder (SUD)  Checklist Symptoms of Substance Use: Continued use despite having a persistent/recurrent physical/psychological problem caused/exacerbated by use, Evidence of withdrawal (Comment), Continued use despite persistent or recurrent social, interpersonal problems, caused or exacerbated by use, Large amounts of time spent to obtain, use or recover from the substance(s), Evidence of tolerance, Presence of craving or strong urge to use, Recurrent use that results in a failure to fulfill major role obligations (work, school, home), Social, occupational, recreational activities given up or reduced due to use, Substance(s) often taken in larger amounts or over longer times than was intended, Persistent desire or unsuccessful efforts to cut down or control use  Recommendations for Services/Supports/Treatments:    DSM5  Diagnoses: Patient Active Problem List   Diagnosis Date Noted   Involved in legal proceedings 12/08/2023   Marital conflict 12/08/2023   Fall 10/21/2023   Iron  deficiency 05/28/2023   Alcohol withdrawal with inpatient treatment (HCC) 05/24/2023   Total bilirubin, elevated 05/24/2023   Traumatic ecchymosis 05/24/2023   Alcoholic ketoacidosis 04/29/2023   Hypokalemia 04/29/2023   Hypocalcemia 04/29/2023   Essential hypertension 04/29/2023   Depression 04/29/2023   Syncope 04/28/2023   Encounter for screening for HIV 04/16/2023   Encounter for hepatitis C screening test for low risk patient 04/16/2023   Establishing care with new doctor, encounter for 04/16/2023   Hyperthyroidism 04/16/2023   Elevated serum glucose 04/16/2023   Mixed hyperlipidemia 04/16/2023   Centrilobular emphysema (HCC) 04/16/2023   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) 04/16/2023   Orthostatic hypotension 11/28/2020   Genetic testing 07/15/2020   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    Hx of colonic polyps    Generalized anxiety disorder 10/14/2019   Alcoholic cirrhosis of liver without ascites (HCC) 06/14/2019   Alcohol abuse 06/14/2019   AKI (acute kidney injury) (HCC) 05/27/2019   Alcoholic intoxication without complication (HCC)    Intentional self-harm by blunt object in home as place of occurrence (HCC) 03/10/2017   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

## 2023-12-23 NOTE — ED Notes (Addendum)
 Pt requesting to look through his belongings before [employer] officer left, states he needs to make sure that everything was in there. Pt states he is missing a red credit card from wallet. [employer] officer at bedside and states that he does not recall a red credit card present PTA. Red credit card not present on arrival to ED in pt wallet or belongings.

## 2023-12-23 NOTE — ED Notes (Signed)
 Hospital meal provided.  100% consumed, pt tolerated w/o complaints.  Waste discarded appropriately.

## 2023-12-23 NOTE — ED Notes (Addendum)
 Blue shirt Black pants Black pair of shoes Gray jacket Black underwear

## 2023-12-23 NOTE — ED Provider Notes (Signed)
 [facility] Provider Note    Event Date/Time   First MD Initiated Contact with Patient 12/23/23 [id number]     (approximate)   History   Psychiatric Evaluation and IVC   HPI  Miguel [name] is a 63 y.o. male history of alcohol abuse, hypertension, asthma presents to the emergency department under IVC.  Patient reports he put his left dorsal wrist with a butter knife tonight because he was upset after an argument with his wife.  He denies any straining to kill himself and denies any SI.  He was placed under IVC by police department after his wife called 911.  Last tetanus vaccine in 2021.  Patient also reports 3 weeks of shortness of breath and wheezing.  No fever.  Nonproductive cough.  No lower sternum swelling or pain.  No chest discomfort.   Per IVC paperwork taken out by law enforcement respondent has having breathing issues but refuses to get medical treatment.  Respondent got into a disagreement with his wife tonight.  Respondent cut his arm with a steak knife.  Told blunt force meant that he would rather die at home then get sick at the hospital.  History provided by patient, law enforcement.    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    OCD (obsessive compulsive disorder)    Renal colic     Past Surgical History:  Procedure Laterality Date   CHOLECYSTECTOMY  2012   COLONOSCOPY     COLONOSCOPY N/A 12/24/2022   Procedure: COLONOSCOPY;  Surgeon: Maryruth Ole DASEN, MD;  Location: [facility] ENDOSCOPY;  Service: Endoscopy;  Laterality: N/A;   COLONOSCOPY WITH PROPOFOL  N/A 05/28/2020   Procedure: COLONOSCOPY WITH PROPOFOL ;  Surgeon: Therisa Bi, MD;  Location: [facility] ENDOSCOPY;  Service: Gastroenterology;   Laterality: N/A;   ESOPHAGOGASTRODUODENOSCOPY N/A 12/24/2022   Procedure: ESOPHAGOGASTRODUODENOSCOPY (EGD);  Surgeon: Maryruth Ole DASEN, MD;  Location: [facility] ENDOSCOPY;  Service: Endoscopy;  Laterality: N/A;   EXTRACORPOREAL SHOCK WAVE LITHOTRIPSY Left 01/12/2019   Procedure: EXTRACORPOREAL SHOCK WAVE LITHOTRIPSY (ESWL);  Surgeon: Francisca Redell BROCKS, MD;  Location: [facility] ORS;  Service: Urology;  Laterality: Left;   UPPER GI ENDOSCOPY      MEDICATIONS:  Prior to Admission medications   Medication Sig Start Date End Date Taking? Authorizing Provider  albuterol  (PROAIR  HFA) 108 (90 Base) MCG/ACT inhaler Inhale 1-2 puffs into the lungs every 4 (four) hours as needed for wheezing or shortness of breath. 04/16/23   Emilio Kelly DASEN, FNP  albuterol  (VENTOLIN  HFA) 108 (90 Base) MCG/ACT inhaler Inhale 2 puffs into the lungs every 4 (four) hours as needed for wheezing or shortness of breath. 09/23/23   Angelena Ole, MD  allopurinol  (ZYLOPRIM ) 100 MG tablet Take 1 tablet (100 mg total) by mouth daily. 04/16/23 04/10/24  Emilio Kelly DASEN, FNP  colchicine  0.6 MG tablet Take 1 tablet (0.6 mg total) by mouth 2 (two) times daily. 06/01/23   Fausto Sor A, DO  diclofenac  Sodium (VOLTAREN ) 1 % GEL Apply 4 g topically 3 (three) times daily as needed (left chest wall pain). 09/23/23   Angelena Ole, MD  ferrous sulfate  325 [phone]  FE) MG tablet Take 1 tablet (325 mg total) by mouth daily with breakfast. 06/02/23   Fausto Sor A, DO  fluticasone -salmeterol (ADVAIR ) 100-50 MCG/ACT AEPB Inhale 1 puff into the lungs 2 (two) times daily. 04/20/23   Emilio Kelly DASEN, FNP  folic acid  (FOLVITE ) 1 MG tablet Take 1 tablet (1 mg total) by mouth daily. 06/01/23   Fausto Sor LABOR, DO  HYDROcodone -acetaminophen  (NORCO/VICODIN) 5-325 MG tablet Take 1-2 tablets by mouth every 6 (six) hours as needed for moderate pain or severe pain. Patient not taking: Reported on 06/18/2023 06/01/23   Fausto Sor A, DO  lisinopril  (ZESTRIL ) 40 MG tablet  TAKE ONE TABLET BY MOUTH ONCE EVERY DAY. 04/16/23   Emilio Kelly DASEN, FNP  methylPREDNISolone  (MEDROL  DOSEPAK) 4 MG TBPK tablet 6 day taper; take as directed on package instructions 06/28/23   Vivienne Delon HERO, PA-C  metoprolol  succinate (TOPROL  XL) 25 MG 24 hr tablet Take 1 tablet (25 mg total) by mouth daily. 04/16/23   Emilio Kelly DASEN, FNP  Multiple Vitamin (MULTIVITAMIN WITH MINERALS) TABS tablet Take 1 tablet by mouth daily. 06/01/23   Fausto Sor LABOR, DO  naltrexone  (DEPADE) 50 MG tablet Take 1 tablet (50 mg total) by mouth daily. 06/18/23   Ostwalt, Janna, PA-C  sertraline  (ZOLOFT ) 50 MG tablet Take 1 tablet (50 mg total) by mouth daily. 04/16/23   Emilio Kelly DASEN, FNP  thiamine  (VITAMIN B-1) 100 MG tablet Take 1 tablet (100 mg total) by mouth daily. 06/01/23   Fausto Sor LABOR, DO  traMADol  (ULTRAM ) 50 MG tablet Take 1 tablet (50 mg total) by mouth every 6 (six) hours as needed. 11/15/23 11/14/24  Arlander Charleston, MD  traZODone  (DESYREL ) 100 MG tablet Take 1 tablet (100 mg total) by mouth at bedtime as needed. 04/20/23   Emilio Kelly DASEN, FNP    Physical Exam   Triage Vital Signs: ED Triage Vitals  Encounter Vitals Group     BP 12/23/23 0246 136/84     Systolic BP Percentile --      Diastolic BP Percentile --      Pulse Rate 12/23/23 0246 (!) 110     Resp 12/23/23 0246 18     Temp 12/23/23 0246 98.6 F (37 C)     Temp Source 12/23/23 0246 Oral     SpO2 12/23/23 0246 97 %     Weight 12/23/23 0246 175 lb (79.4 kg)     Height 12/23/23 0246 6' (1.829 m)     Head Circumference --      Peak Flow --      Pain Score 12/23/23 0259 0     Pain Loc --      Pain Education --      Exclude from Growth Chart --     Most recent vital signs: Vitals:   12/23/23 0246 12/23/23 0619  BP: 136/84   Pulse: (!) 110 91  Resp: 18   Temp: 98.6 F (37 C)   SpO2: 97% 97%    CONSTITUTIONAL: Alert, responds appropriately to questions.  Chronically ill-appearing HEAD: Normocephalic, atraumatic EYES:  Conjunctivae clear, pupils appear equal, sclera nonicteric ENT: normal nose; moist mucous membranes NECK: Supple, normal ROM CARD: RRR; S1 and S2 appreciated RESP: Normal chest excursion without splinting or tachypnea; scattered expiratory wheezes, diminished aeration at bases, no rhonchi or rales, no respiratory distress, no hypoxia, no increased work of breathing ABD/GI: Non-distended; soft, non-tender, no rebound, no guarding, no peritoneal signs BACK: The back appears normal EXT: Normal  ROM in all joints; no deformity noted, no edema, no calf tenderness or calf swelling SKIN: Normal color for age and race; warm; no rash on exposed skin, superficial abrasion noted to the dorsal left wrist and forearm NEURO: Moves all extremities equally, normal speech PSYCH: The patient's mood and manner are appropriate.  Denies SI or HI.   ED Results / Procedures / Treatments   LABS: (all labs ordered are listed, but only abnormal results are displayed) Labs Reviewed  COMPREHENSIVE METABOLIC PANEL - Abnormal; Notable for the following components:      Result Value   CO2 20 (*)    Glucose, Bld 105 (*)    Calcium  8.5 (*)    Anion gap 16 (*)    All other components within normal limits  ETHANOL - Abnormal; Notable for the following components:   Alcohol, Ethyl (B) 269 (*)    All other components within normal limits  SALICYLATE LEVEL - Abnormal; Notable for the following components:   Salicylate Lvl <7.0 (*)    All other components within normal limits  ACETAMINOPHEN  LEVEL - Abnormal; Notable for the following components:   Acetaminophen  (Tylenol ), Serum <10 (*)    All other components within normal limits  CBC - Abnormal; Notable for the following components:   Hemoglobin 12.8 (*)    HCT 38.7 (*)    RDW 17.5 (*)    All other components within normal limits  URINE DRUG SCREEN, QUALITATIVE ([facility] ONLY) - Abnormal; Notable for the following components:   Cannabinoid 50 Ng, Ur [place] POSITIVE (*)     Barbiturates, Ur Screen POSITIVE (*)    All other components within normal limits  RESP PANEL BY RT-PCR (RSV, FLU A&B, COVID)  RVPGX2  ETHANOL  TROPONIN I (HIGH SENSITIVITY)     EKG:  EKG Interpretation Date/Time:  Thursday December 23 2023 03:50:03 EST Ventricular Rate:  108 PR Interval:  150 QRS Duration:  80 QT Interval:  344 QTC Calculation: 460 R Axis:   28  Text Interpretation: Sinus tachycardia Low voltage QRS Cannot rule out Anterior infarct (cited on or before 21-Oct-2023) Abnormal ECG When compared with ECG of 22-Dec-2023 18:52, (unconfirmed) QRS axis Shifted right Confirmed by Neomi Neptune [phone]) on 12/23/2023 4:07:42 AM         RADIOLOGY: My personal review and interpretation of imaging: CT chest shows healing rib fractures.  No pneumonia.  I have personally reviewed all radiology reports.   CT CHEST WO CONTRAST Result Date: 12/23/2023 CLINICAL DATA:  63 year old male with questionable new right lung nodularity on x-ray this morning. EXAM: CT CHEST WITHOUT CONTRAST TECHNIQUE: Multidetector CT imaging of the chest was performed following the standard protocol without IV contrast. RADIATION DOSE REDUCTION: This exam was performed according to the departmental dose-optimization program which includes automated exposure control, adjustment of the mA and/or kV according to patient size and/or use of iterative reconstruction technique. COMPARISON:  Portable chest 0444 hours today. Prior chest CT 08/07/2023. FINDINGS: Cardiovascular: Heart size remains normal. No pericardial effusion. No age advanced calcified atherosclerosis is evident. Vascular patency is not evaluated in the absence of IV contrast. Mediastinum/Nodes: Negative noncontrast appearance. No evidence of mass or lymphadenopathy. Lungs/Pleura: Healing fractures of the anterior right 3rd, 4th, 6th ribs which correspond to the nodular radiographic appearance today. Underlying chronic fractures of the right lateral 7th and  8th ribs appears stable since September. Major airways are patent. Minor respiratory motion but both lungs are essentially clear. No pleural effusion. Minimal benign soft tissue  thickening along the right minor fissure (coronal image 34) unchanged from last year. Upper Abdomen: Mild motion artifact. Chronic hepatic steatosis. Chronically diminutive or absent gallbladder. Negative visible noncontrast spleen, pancreas, adrenal glands, kidneys, bowel in the upper abdomen. Musculoskeletal: Mix of healing and chronic right rib fractures as stated above. No other acute osseous abnormality identified. IMPRESSION: 1. Healing fractures of the anterior right 3rd, 4th, and 6th ribs explains the nodular x-ray appearance today, new from CT in September. Underlying chronic right 7th and 8th rib fractures are unchanged. 2. Negative lungs, no other acute or inflammatory process identified in the noncontrast Chest. 3. Chronic hepatic steatosis. Electronically Signed   By: VEAR Hurst M.D.   On: 12/23/2023 07:20   DG Chest Portable 1 View Result Date: 12/23/2023 CLINICAL DATA:  Shortness of breath. EXAM: PORTABLE CHEST 1 VIEW COMPARISON:  09/23/2023 FINDINGS: Left lung clear. Nodular densities are seen over the right mid and lower lung, superimposed on the anterior right fourth and sixth ribs. These are new since prior chest x-ray. No pulmonary nodule in the right lung on CT of 08/07/2023 and no healed rib fractures on that study corresponding to the findings on today's x-ray. The cardiopericardial silhouette is within normal limits for size. No acute bony abnormality. IMPRESSION: Nodular densities over the right mid and lower lung, new since prior chest x-ray. No etiology for these opacities evident on CT scan from 08/07/2023. CT chest without contrast recommended to further evaluate. Electronically Signed   By: Camellia Candle M.D.   On: 12/23/2023 05:09     PROCEDURES:  Critical Care performed:  No     Procedures    IMPRESSION / MDM / ASSESSMENT AND PLAN / ED COURSE  I reviewed the triage vital signs and the nursing notes.    Patient here under IVC by law enforcement after cutting himself.  Also with shortness of breath and wheezing.  History of asthma.   DIFFERENTIAL DIAGNOSIS (includes but not limited to):   Asthma exacerbation, COPD exacerbation, pneumonia, CHF, PE, viral URI, doubt ACS  Depression, anxiety, alcohol abuse   Patient's presentation is most consistent with acute presentation with potential threat to life or bodily function.   PLAN: Will obtain screening labs, urine.  Will obtain COVID, flu swab, chest x-ray, troponin.  Will give breathing treatments, prednisone .  Will place on CIWA protocol.  Will consult psychiatry and TTS.  Patient under full IVC at this time.   MEDICATIONS GIVEN IN ED: Medications  LORazepam  (ATIVAN ) injection 0-4 mg ( Intravenous See Alternative 12/23/23 9362)    Or  LORazepam  (ATIVAN ) tablet 0-4 mg (1 mg Oral Given 12/23/23 0637)  LORazepam  (ATIVAN ) injection 0-4 mg (has no administration in time range)    Or  LORazepam  (ATIVAN ) tablet 0-4 mg (has no administration in time range)  thiamine  (VITAMIN B1) tablet 100 mg (has no administration in time range)    Or  thiamine  (VITAMIN B1) injection 100 mg (has no administration in time range)  benzonatate  (TESSALON ) capsule 100 mg (100 mg Oral Given 12/23/23 0637)  ipratropium-albuterol  (DUONEB) 0.5-2.5 (3) MG/3ML nebulizer solution 3 mL (has no administration in time range)  predniSONE  (DELTASONE ) tablet 60 mg (has no administration in time range)  ipratropium-albuterol  (DUONEB) 0.5-2.5 (3) MG/3ML nebulizer solution 3 mL (3 mLs Nebulization Given 12/23/23 0332)  predniSONE  (DELTASONE ) tablet 60 mg (60 mg Oral Given 12/23/23 0332)  ipratropium-albuterol  (DUONEB) 0.5-2.5 (3) MG/3ML nebulizer solution 3 mL (3 mLs Nebulization Given 12/23/23 0415)     ED  COURSE: Breathing has improved with  breathing treatments and lungs are clearing.  Chest x-ray showed nodular densities in the right mid and lower lung that are new compared to last chest x-ray in November 2024.  Will obtain CT of the chest without contrast per radiology recommendations.  COVID, flu and RSV negative  Labs show no leukocytosis.  Negative troponin.  No indication for serial enzymes.  Alcohol level of 269 normal LFTs.  Mild metabolic acidosis likely from alcohol ketoacidosis.  Normal blood glucose.  Patient seen by the psychiatric nurse practitioner.  They recommend reassessment in the morning.  They recommend social work consultation as well.  They have asked for a repeat ethanol level which has been ordered.   7:30 AM  Pt's chest CT reviewed and interpreted by myself and the radiologist and shows multiple healing rib fractures which explain the nodularity seen on chest x-ray.  Lungs however are clear without pneumonia or inflammation.  Suspect patient is having an asthma/COPD exacerbation which seems to be improving here.  He does not require medical admission at this time.  Awaiting further psychiatric evaluation for disposition.  I have already sent a prescription for albuterol  inhaler to his pharmacy and he states he has Advair  for home.  If discharged, patient will need a prescription for a prednisone  burst.     CONSULTS: Psychiatry and TTS consulted.  Psychiatry will reassess in the morning.   OUTSIDE RECORDS REVIEWED: Reviewed previous psychiatric notes.  Patient admitted to psychiatry in May 2024 after cutting his wrist.       FINAL CLINICAL IMPRESSION(S) / ED DIAGNOSES   Final diagnoses:  Nonsuicidal self-injury (HCC)  Involuntary commitment  Exacerbation of intermittent asthma, unspecified asthma severity     Rx / DC Orders   ED Discharge Orders          Ordered    albuterol  (VENTOLIN  HFA) 108 (90 Base) MCG/ACT inhaler  Every 4 hours PRN        12/23/23 0709             Note:  This  document was prepared using Dragon voice recognition software and may include unintentional dictation errors.   Almarie Kurdziel, Josette SAILOR, DO 12/23/23 [phone]

## 2023-12-23 NOTE — ED Notes (Signed)
 All belongings returned to patient at time of discharge. Patient provided with cab voucher. Patient with steady gait at time of departure. Patient denies SI/HI/AH/VH. E-signature not working at this time. Pt verbalized understanding of D/C instructions, prescriptions and follow up care with no further questions at this time. Pt in NAD and ambulatory at time of D/C.

## 2023-12-24 ENCOUNTER — Other Ambulatory Visit: Payer: Self-pay

## 2024-01-08 ENCOUNTER — Emergency Department
Admission: EM | Admit: 2024-01-08 | Discharge: 2024-01-08 | Disposition: A | Discharge status: Home / Self Care | Payer: MEDICAID | Attending: Emergency Medicine | Admitting: Emergency Medicine

## 2024-01-08 ENCOUNTER — Other Ambulatory Visit: Payer: Self-pay

## 2024-01-08 ENCOUNTER — Emergency Department: Payer: MEDICAID

## 2024-01-08 DIAGNOSIS — J45909 Unspecified asthma, uncomplicated: Secondary | ICD-10-CM | POA: Insufficient documentation

## 2024-01-08 DIAGNOSIS — J101 Influenza due to other identified influenza virus with other respiratory manifestations: Secondary | ICD-10-CM | POA: Insufficient documentation

## 2024-01-08 DIAGNOSIS — F10120 Alcohol abuse with intoxication, uncomplicated: Secondary | ICD-10-CM | POA: Diagnosis not present

## 2024-01-08 DIAGNOSIS — I10 Essential (primary) hypertension: Secondary | ICD-10-CM | POA: Diagnosis not present

## 2024-01-08 DIAGNOSIS — F10920 Alcohol use, unspecified with intoxication, uncomplicated: Secondary | ICD-10-CM

## 2024-01-08 DIAGNOSIS — R0602 Shortness of breath: Secondary | ICD-10-CM | POA: Diagnosis present

## 2024-01-08 LAB — CBC WITH DIFFERENTIAL/PLATELET
Abs Immature Granulocytes: 0.02 10*3/uL (ref 0.00–0.07)
Basophils Absolute: 0.1 10*3/uL (ref 0.0–0.1)
Basophils Relative: 1 %
Eosinophils Absolute: 0.2 10*3/uL (ref 0.0–0.5)
Eosinophils Relative: 3 %
HCT: 41.9 % (ref 39.0–52.0)
Hemoglobin: 13.5 g/dL (ref 13.0–17.0)
Immature Granulocytes: 0 %
Lymphocytes Relative: 30 %
Lymphs Abs: 1.6 10*3/uL (ref 0.7–4.0)
MCH: 29.2 pg (ref 26.0–34.0)
MCHC: 32.2 g/dL (ref 30.0–36.0)
MCV: 90.7 fL (ref 80.0–100.0)
Monocytes Absolute: 1 10*3/uL (ref 0.1–1.0)
Monocytes Relative: 19 %
Neutro Abs: 2.4 10*3/uL (ref 1.7–7.7)
Neutrophils Relative %: 47 %
Platelets: 214 10*3/uL (ref 150–400)
RBC: 4.62 MIL/uL (ref 4.22–5.81)
RDW: 16.7 % — ABNORMAL HIGH (ref 11.5–15.5)
WBC: 5.2 10*3/uL (ref 4.0–10.5)
nRBC: 0 % (ref 0.0–0.2)

## 2024-01-08 LAB — COMPREHENSIVE METABOLIC PANEL
ALT: 17 U/L (ref 0–44)
AST: 23 U/L (ref 15–41)
Albumin: 3.8 g/dL (ref 3.5–5.0)
Alkaline Phosphatase: 68 U/L (ref 38–126)
Anion gap: 14 (ref 5–15)
BUN: 12 mg/dL (ref 8–23)
CO2: 21 mmol/L — ABNORMAL LOW (ref 22–32)
Calcium: 8.9 mg/dL (ref 8.9–10.3)
Chloride: 103 mmol/L (ref 98–111)
Creatinine, Ser: 1.03 mg/dL (ref 0.61–1.24)
GFR, Estimated: >60 mL/min (ref >60)
Glucose, Bld: 95 mg/dL (ref 70–99)
Potassium: 4.2 mmol/L (ref 3.5–5.1)
Sodium: 138 mmol/L (ref 135–145)
Total Bilirubin: 0.6 mg/dL (ref 0.0–1.2)
Total Protein: 6.9 g/dL (ref 6.5–8.1)

## 2024-01-08 LAB — RESP PANEL BY RT-PCR (RSV, FLU A&B, COVID)  RVPGX2
Influenza A by PCR: POSITIVE — AB
Influenza B by PCR: NEGATIVE
Resp Syncytial Virus by PCR: NEGATIVE
SARS Coronavirus 2 by RT PCR: NEGATIVE

## 2024-01-08 LAB — LIPASE, BLOOD: Lipase: 37 U/L (ref 11–51)

## 2024-01-08 MED ORDER — SODIUM CHLORIDE 0.9 % IV BOLUS
1000.0000 mL | Freq: Once | INTRAVENOUS | Status: AC
  Administered 2024-01-08: 1000 mL via INTRAVENOUS

## 2024-01-08 MED ORDER — FLUTICASONE PROPIONATE 50 MCG/ACT NA SUSP
2.0000 | NASAL | Status: AC
  Administered 2024-01-08: 2 via NASAL
  Filled 2024-01-08: qty 16

## 2024-01-08 MED ORDER — IPRATROPIUM-ALBUTEROL 0.5-2.5 (3) MG/3ML IN SOLN
3.0000 mL | Freq: Once | RESPIRATORY_TRACT | Status: AC
  Administered 2024-01-08: 3 mL via RESPIRATORY_TRACT
  Filled 2024-01-08: qty 3

## 2024-01-08 MED ORDER — ONDANSETRON HCL 4 MG/2ML IJ SOLN
4.0000 mg | Freq: Once | INTRAMUSCULAR | Status: AC
  Administered 2024-01-08: 4 mg via INTRAVENOUS
  Filled 2024-01-08: qty 2

## 2024-01-08 MED ORDER — ALBUTEROL SULFATE (2.5 MG/3ML) 0.083% IN NEBU: 2.5000 mg | INHALATION_SOLUTION | RESPIRATORY_TRACT | Status: DC | PRN

## 2024-01-08 NOTE — ED Notes (Signed)
 Pt provided with meal bag and water. Requesting to spend the night in the room with his wife who is admitted. Pt denies any other needs at this time. CB within reach.

## 2024-01-08 NOTE — Discharge Instructions (Signed)
 Your flu test is positive. Continue taking your medications and drinking plenty of fluids to stay hydrated while this illness runs its course.

## 2024-01-08 NOTE — ED Notes (Signed)
 After discussing with charge RN, Pt made aware he can not visit his wife on the floor due to being flu positive. Pt immediately requesting discharge provider aware

## 2024-01-08 NOTE — ED Triage Notes (Signed)
 See first nurse note. To ED for SOB since 3 days, hx asthma. Per EMS report, ETOH on board. Expiratory wheezing. Pt is daily drinker.  EDP at bedside.

## 2024-01-08 NOTE — ED Provider Notes (Signed)
 [facility] Provider Note    Event Date/Time   First MD Initiated Contact with Patient 01/08/24 1726     (approximate)   History   Chief Complaint: Shortness of Breath   HPI  Miguel [name] is a 63 y.o. male with a history of hypertension, GERD, frequent alcohol abuse who comes ED complaining of shortness of breath for the past 3 days along with cough, body aches, fatigue.  Has also been drinking heavily today.  No SI or HI or wounds.  No vomiting or pain.  Patient is well-known to this department.  Tends to drink heavily and come to the ED whenever his wife is in the hospital.  She is currently hospitalized due to her own recurrent chronic health issues.          Physical Exam   Triage Vital Signs: ED Triage Vitals  Encounter Vitals Group     BP 01/08/24 1734 116/87     Systolic BP Percentile --      Diastolic BP Percentile --      Pulse Rate 01/08/24 1730 80     Resp 01/08/24 1734 20     Temp 01/08/24 1734 (!) 97.4 F (36.3 C)     Temp Source 01/08/24 1734 Axillary     SpO2 01/08/24 1730 100 %     Weight 01/08/24 1729 174 lb 2.6 oz (79 kg)     Height 01/08/24 1729 6' (1.829 m)     Head Circumference --      Peak Flow --      Pain Score 01/08/24 1730 0     Pain Loc --      Pain Education --      Exclude from Growth Chart --     Most recent vital signs: Vitals:   01/08/24 2000 01/08/24 2030  BP: 112/80 120/74  Pulse: 83 82  Resp: 14 11  Temp:    SpO2: 98% 99%    General: Awake, no distress.  Slurred speech CV:  Good peripheral perfusion.  Regular rate rhythm Resp:  Normal effort.  Clear to auscultation bilaterally, no wheezing or crackles Abd:  No distention.  Soft nontender Other:  Moist oral mucosa   ED Results / Procedures / Treatments   Labs (all labs ordered are listed, but only abnormal results are displayed) Labs Reviewed  RESP PANEL BY RT-PCR (RSV, FLU A&B, COVID)  RVPGX2 - Abnormal; Notable for the following  components:      Result Value   Influenza A by PCR POSITIVE (*)    All other components within normal limits  COMPREHENSIVE METABOLIC PANEL - Abnormal; Notable for the following components:   CO2 21 (*)    All other components within normal limits  CBC WITH DIFFERENTIAL/PLATELET - Abnormal; Notable for the following components:   RDW 16.7 (*)    All other components within normal limits  LIPASE, BLOOD     EKG Interpreted by me Sinus rhythm rate of 72.  Normal axis, normal intervals.  Poor R wave progression.  Normal ST segments and T waves   RADIOLOGY Chest x-ray interpreted by me, unremarkable.  Radiology report reviewed   PROCEDURES:  Procedures   MEDICATIONS ORDERED IN ED: Medications  albuterol (PROVENTIL) (2.5 MG/3ML) 0.083% nebulizer solution 2.5 mg (has no administration in time range)  ondansetron (ZOFRAN) injection 4 mg (4 mg Intravenous Given 01/08/24 1812)  sodium chloride 0.9 % bolus 1,000 mL (0 mLs Intravenous Stopped 01/08/24 2000)  ipratropium-albuterol (  DUONEB) 0.5-2.5 (3) MG/3ML nebulizer solution 3 mL (3 mLs Nebulization Given 01/08/24 1929)  fluticasone (FLONASE) 50 MCG/ACT nasal spray 2 spray (2 sprays Each Nare Given 01/08/24 1944)     IMPRESSION / MDM / ASSESSMENT AND PLAN / ED COURSE  I reviewed the triage vital signs and the nursing notes.  DDx: Alcohol intoxication, COVID, influenza, RSV, pancreatitis, electrolyte derangement  Patient's presentation is most consistent with acute presentation with potential threat to life or bodily function.  Patient presents with clinically apparent intoxication, also symptoms suggestive of viral illness.  Will check labs, respiratory viral panel, give IV fluids and DuoNeb.   Clinical Course as of 01/09/24 0103  Sat Jan 08, 2024  2049 Feeling better, clinically sober, stable for DC. [PS]    Clinical Course User Index [PS] Sharman Cheek, MD     FINAL CLINICAL IMPRESSION(S) / ED DIAGNOSES   Final  diagnoses:  Influenza A  Alcoholic intoxication without complication (HCC)     Rx / DC Orders   ED Discharge Orders     None        Note:  This document was prepared using Dragon voice recognition software and may include unintentional dictation errors.   Sharman Cheek, MD 01/09/24 [phone]

## 2024-01-08 NOTE — ED Triage Notes (Signed)
 First nurse note: Pt to ED via [facility] from home. Pt reports SOB x 4-5 days. Hx of asthma. Etoh on board per EMS  100% RA Duoneb in route HR 80 CBG 97 120/88 .

## 2024-01-08 NOTE — ED Notes (Signed)
 Pt called wife to let her know he's in the hospital.

## 2024-01-08 NOTE — ED Notes (Signed)
 Patient transported to X-ray

## 2024-01-08 NOTE — ED Notes (Signed)
 Pt continually states he is short of breath and cannot breathe, but can speak full sentences and oxygen sat remains between 97-100%.  Pt offered a neb treatment, but refused and sts "they make me anxious."

## 2024-01-13 ENCOUNTER — Emergency Department: Payer: MEDICAID

## 2024-01-13 ENCOUNTER — Other Ambulatory Visit: Payer: Self-pay

## 2024-01-13 ENCOUNTER — Encounter: Payer: Self-pay | Admitting: Emergency Medicine

## 2024-01-13 ENCOUNTER — Emergency Department
Admission: EM | Admit: 2024-01-13 | Discharge: 2024-01-13 | Disposition: A | Discharge status: Home / Self Care | Payer: MEDICAID | Attending: Emergency Medicine | Admitting: Emergency Medicine

## 2024-01-13 DIAGNOSIS — I10 Essential (primary) hypertension: Secondary | ICD-10-CM | POA: Diagnosis not present

## 2024-01-13 DIAGNOSIS — R0600 Dyspnea, unspecified: Secondary | ICD-10-CM | POA: Insufficient documentation

## 2024-01-13 DIAGNOSIS — R0602 Shortness of breath: Secondary | ICD-10-CM | POA: Diagnosis present

## 2024-01-13 DIAGNOSIS — R059 Cough, unspecified: Secondary | ICD-10-CM | POA: Diagnosis not present

## 2024-01-13 LAB — BASIC METABOLIC PANEL
Anion gap: 18 — ABNORMAL HIGH (ref 5–15)
BUN: 11 mg/dL (ref 8–23)
CO2: 18 mmol/L — ABNORMAL LOW (ref 22–32)
Calcium: 8.9 mg/dL (ref 8.9–10.3)
Chloride: 105 mmol/L (ref 98–111)
Creatinine, Ser: 1.08 mg/dL (ref 0.61–1.24)
GFR, Estimated: >60 mL/min (ref >60)
Glucose, Bld: 94 mg/dL (ref 70–99)
Potassium: 4.2 mmol/L (ref 3.5–5.1)
Sodium: 141 mmol/L (ref 135–145)

## 2024-01-13 LAB — CBC
HCT: 46.1 % (ref 39.0–52.0)
Hemoglobin: 14.9 g/dL (ref 13.0–17.0)
MCH: 29.3 pg (ref 26.0–34.0)
MCHC: 32.3 g/dL (ref 30.0–36.0)
MCV: 90.7 fL (ref 80.0–100.0)
Platelets: 271 10*3/uL (ref 150–400)
RBC: 5.08 MIL/uL (ref 4.22–5.81)
RDW: 16.7 % — ABNORMAL HIGH (ref 11.5–15.5)
WBC: 7.1 10*3/uL (ref 4.0–10.5)
nRBC: 0 % (ref 0.0–0.2)

## 2024-01-13 LAB — RESP PANEL BY RT-PCR (RSV, FLU A&B, COVID)  RVPGX2
Influenza A by PCR: NEGATIVE
Influenza B by PCR: NEGATIVE
Resp Syncytial Virus by PCR: NEGATIVE
SARS Coronavirus 2 by RT PCR: NEGATIVE

## 2024-01-13 LAB — TROPONIN I (HIGH SENSITIVITY): Troponin I (High Sensitivity): 4 ng/L (ref <18)

## 2024-01-13 LAB — ETHANOL: Alcohol, Ethyl (B): 293 mg/dL — ABNORMAL HIGH (ref <10)

## 2024-01-13 MED ORDER — LORAZEPAM 2 MG/ML IJ SOLN
1.0000 mg | Freq: Once | INTRAMUSCULAR | Status: AC
Start: 2024-01-13 — End: 2024-01-13
  Administered 2024-01-13: 1 mg via INTRAVENOUS
  Filled 2024-01-13: qty 1

## 2024-01-13 MED ORDER — PREDNISONE 10 MG PO TABS: 10.0000 mg | ORAL_TABLET | Freq: Every day | ORAL | 0 refills | Status: DC

## 2024-01-13 MED ORDER — PREDNISONE 10 MG PO TABS
10.0000 mg | ORAL_TABLET | Freq: Every day | ORAL | 0 refills | Status: DC
  Filled 2024-01-13: qty 30, 30d supply, fill #0

## 2024-01-13 MED ORDER — LORAZEPAM 1 MG PO TABS
1.0000 mg | ORAL_TABLET | Freq: Once | ORAL | Status: AC
  Administered 2024-01-13: 1 mg via ORAL
  Filled 2024-01-13: qty 1

## 2024-01-13 MED ORDER — IPRATROPIUM-ALBUTEROL 0.5-2.5 (3) MG/3ML IN SOLN
3.0000 mL | Freq: Once | RESPIRATORY_TRACT | Status: AC
  Administered 2024-01-13: 3 mL via RESPIRATORY_TRACT
  Filled 2024-01-13: qty 3

## 2024-01-13 MED ORDER — METHYLPREDNISOLONE SODIUM SUCC 125 MG IJ SOLR
125.0000 mg | Freq: Once | INTRAMUSCULAR | Status: AC
  Administered 2024-01-13: 125 mg via INTRAVENOUS
  Filled 2024-01-13: qty 2

## 2024-01-13 NOTE — ED Provider Notes (Signed)
 [facility] Provider Note    Event Date/Time   First MD Initiated Contact with Patient 01/13/24 [id number]     (approximate)  History   Chief Complaint: Shortness of Breath  HPI  Miguel [name] is a 63 y.o. male with a past medical history of alcohol abuse, gastric reflux, hypertension, presents to the emergency department for cough and shortness of breath.  According to the patient for the past week or so he has been coughing with shortness of breath as well as wheeze.  Patient does use alcohol on a daily basis, he states his last drink was 2 to 3 days ago.  Patient states that shortness of breath has been more pronounced over the last couple days of the patient came to the emergency department today for evaluation.  He states his wife at home has similar symptoms and has had a fever as well.  Patient does not believe he has had a fever but he is not sure.  Physical Exam   Triage Vital Signs: ED Triage Vitals  Encounter Vitals Group     BP 01/13/24 1551 (!) 132/111     Systolic BP Percentile --      Diastolic BP Percentile --      Pulse Rate 01/13/24 1551 (!) 111     Resp 01/13/24 1551 (!) 24     Temp 01/13/24 1551 98.6 F (37 C)     Temp Source 01/13/24 1551 Oral     SpO2 01/13/24 1551 90 %     Weight 01/13/24 1552 180 lb (81.6 kg)     Height 01/13/24 1552 6' (1.829 m)     Head Circumference --      Peak Flow --      Pain Score 01/13/24 1551 0     Pain Loc --      Pain Education --      Exclude from Growth Chart --     Most recent vital signs: Vitals:   01/13/24 1554 01/13/24 1620  BP:  (!) 159/110  Pulse:  (!) 105  Resp:  16  Temp:    SpO2: 92% 98%    General: Awake, no distress.  CV:  Good peripheral perfusion.  Regular rate and rhythm  Resp:  Normal effort.  Equal breath sounds bilaterally.  Patient has mild expiratory wheeze bilaterally.  No rales or rhonchi Abd:  No distention.  Soft, nontender.  No rebound or guarding.  ED Results /  Procedures / Treatments   EKG  EKG viewed and interpreted by myself shows sinus tachycardia at 112 bpm with a narrow QRS, normal axis, normal intervals, nonspecific ST changes.  RADIOLOGY  I have reviewed and interpreted chest x-ray images.  No consolidation on my evaluation.   MEDICATIONS ORDERED IN ED: Medications  ipratropium-albuterol (DUONEB) 0.5-2.5 (3) MG/3ML nebulizer solution 3 mL (has no administration in time range)  ipratropium-albuterol (DUONEB) 0.5-2.5 (3) MG/3ML nebulizer solution 3 mL (has no administration in time range)  methylPREDNISolone sodium succinate (SOLU-MEDROL) 125 mg/2 mL injection 125 mg (has no administration in time range)  LORazepam (ATIVAN) injection 1 mg (has no administration in time range)     IMPRESSION / MDM / ASSESSMENT AND PLAN / ED COURSE  I reviewed the triage vital signs and the nursing notes.  Patient's presentation is most consistent with acute presentation with potential threat to life or bodily function.  Patient presents to the emergency department for 1 week of cough congestion and shortness of breath.  He feels like his symptoms are worsening over the last 2 days.  Patient states his wife has similar symptoms at home.  We will check labs including cardiac enzyme.  Will obtain a COVID/flu swab to evaluate for viral process.  We will obtain a chest x-ray to evaluate for pneumonia.  Given the patient's Tory wheeze although mild we will treat with Solu-Medrol and DuoNebs.  Patient states he is very anxious feeling we will give a dose of Ativan.  Patient's workup has resulted showing an elevated alcohol level of 293.  Patient CBC reassuring, chemistry reassuring, respiratory panel is negative, troponin negative, chest x-ray is clear.  Patient is requesting to be discharged home, states his wife has MS and he needs to get back home to care for her.  Patient speaking in full and complete sentences satting between 93 and 95% on room air throughout  my evaluation in the emergency department pulse rate remains slightly elevated around 110 bpm however the patient did receive steroids and breathing treatments in the emergency department.  Given the patient's reassuring workup and as he is requesting to be discharged home I believe this is reasonable.  Discussed with the patient to increase fluids abstain from significant alcohol use and take his prednisone taper as prescribed.  Patient agreeable plan of care.  FINAL CLINICAL IMPRESSION(S) / ED DIAGNOSES   Cough Dyspnea  Note:  This document was prepared using Dragon voice recognition software and may include unintentional dictation errors.   Minna Antis, MD 01/13/24 [phone]

## 2024-01-13 NOTE — ED Triage Notes (Signed)
 Patient to ED via [facility] from home for SOB and cough x1 week. Pt takes he had flu shot 3 weeks ago. PT reports last alcohol use yesterday. Taking OTC emds at home with no relief. Dyspnea at rest noted.

## 2024-01-13 NOTE — ED Notes (Signed)
 Pt was found on bedside commode, ripped all cord off his chest and was saying he wanted to go home. Pt was assisted back into bed, sats were intially 84% on RA but increased after a min or so. Pt is sating 96% on RA, encouraged Pt to stay in bed and you use the call light if needed anything. MD notified Pt may benefit from an additional breathing tx, awaiting new orders

## 2024-01-13 NOTE — ED Triage Notes (Signed)
 First Nurse Note;  Pt via [facility] from home. Pt c/o cough x1 week. Pt has ETOH on board. Pt is tearful and crying during triage.  Pt is A&OX4 and NAD

## 2024-01-13 NOTE — Discharge Instructions (Addendum)
 As we discussed please take your steroids as prescribed for their entire course.  Please use your nebulizer treatments as prescribed by your doctor.  Return to the emergency department for any worsening shortness of breath, chest pain, or any other symptom concerning to yourself.  Otherwise please follow-up with your doctor in the next 2 to 3 days for recheck/reevaluation.  Please attempt to abstain from alcohol or significantly reduce your alcohol consumption.  Please increase your water intake.

## 2024-01-14 ENCOUNTER — Other Ambulatory Visit: Payer: Self-pay

## 2024-03-07 ENCOUNTER — Ambulatory Visit: Payer: MEDICAID | Admitting: Physician Assistant

## 2024-03-20 ENCOUNTER — Ambulatory Visit: Payer: MEDICAID | Admitting: Family Medicine

## 2024-03-27 ENCOUNTER — Encounter (HOSPITAL_COMMUNITY): Payer: Self-pay

## 2024-04-03 ENCOUNTER — Ambulatory Visit: Payer: MEDICAID | Admitting: Family Medicine

## 2024-04-11 ENCOUNTER — Observation Stay
Admission: EM | Admit: 2024-04-11 | Discharge: 2024-04-12 | Disposition: A | Discharge status: Home / Self Care | Payer: MEDICAID | Attending: Internal Medicine | Admitting: Internal Medicine

## 2024-04-11 ENCOUNTER — Emergency Department: Payer: MEDICAID

## 2024-04-11 ENCOUNTER — Other Ambulatory Visit: Payer: Self-pay

## 2024-04-11 DIAGNOSIS — R52 Pain, unspecified: Secondary | ICD-10-CM | POA: Diagnosis not present

## 2024-04-11 DIAGNOSIS — Z79899 Other long term (current) drug therapy: Secondary | ICD-10-CM | POA: Insufficient documentation

## 2024-04-11 DIAGNOSIS — F10129 Alcohol abuse with intoxication, unspecified: Secondary | ICD-10-CM | POA: Diagnosis not present

## 2024-04-11 DIAGNOSIS — R079 Chest pain, unspecified: Secondary | ICD-10-CM | POA: Diagnosis present

## 2024-04-11 DIAGNOSIS — S2242XA Multiple fractures of ribs, left side, initial encounter for closed fracture: Secondary | ICD-10-CM | POA: Diagnosis not present

## 2024-04-11 DIAGNOSIS — F411 Generalized anxiety disorder: Secondary | ICD-10-CM | POA: Diagnosis not present

## 2024-04-11 DIAGNOSIS — F332 Major depressive disorder, recurrent severe without psychotic features: Secondary | ICD-10-CM | POA: Diagnosis not present

## 2024-04-11 DIAGNOSIS — Z87891 Personal history of nicotine dependence: Secondary | ICD-10-CM | POA: Insufficient documentation

## 2024-04-11 DIAGNOSIS — F4325 Adjustment disorder with mixed disturbance of emotions and conduct: Secondary | ICD-10-CM | POA: Diagnosis present

## 2024-04-11 DIAGNOSIS — J45909 Unspecified asthma, uncomplicated: Secondary | ICD-10-CM | POA: Diagnosis not present

## 2024-04-11 DIAGNOSIS — R55 Syncope and collapse: Secondary | ICD-10-CM | POA: Diagnosis present

## 2024-04-11 DIAGNOSIS — I10 Essential (primary) hypertension: Secondary | ICD-10-CM | POA: Diagnosis not present

## 2024-04-11 DIAGNOSIS — G894 Chronic pain syndrome: Secondary | ICD-10-CM | POA: Insufficient documentation

## 2024-04-11 DIAGNOSIS — E782 Mixed hyperlipidemia: Secondary | ICD-10-CM | POA: Diagnosis present

## 2024-04-11 DIAGNOSIS — E8729 Other acidosis: Secondary | ICD-10-CM | POA: Diagnosis present

## 2024-04-11 DIAGNOSIS — W01198A Fall on same level from slipping, tripping and stumbling with subsequent striking against other object, initial encounter: Secondary | ICD-10-CM | POA: Diagnosis not present

## 2024-04-11 DIAGNOSIS — S2249XA Multiple fractures of ribs, unspecified side, initial encounter for closed fracture: Secondary | ICD-10-CM | POA: Insufficient documentation

## 2024-04-11 DIAGNOSIS — F1010 Alcohol abuse, uncomplicated: Secondary | ICD-10-CM | POA: Diagnosis present

## 2024-04-11 LAB — COMPREHENSIVE METABOLIC PANEL WITH GFR
ALT: <5 U/L (ref 0–44)
AST: 26 U/L (ref 15–41)
Albumin: 3.7 g/dL (ref 3.5–5.0)
Alkaline Phosphatase: 53 U/L (ref 38–126)
Anion gap: 9 (ref 5–15)
BUN: 12 mg/dL (ref 8–23)
CO2: 25 mmol/L (ref 22–32)
Calcium: 8.6 mg/dL — ABNORMAL LOW (ref 8.9–10.3)
Chloride: 107 mmol/L (ref 98–111)
Creatinine, Ser: 1.1 mg/dL (ref 0.61–1.24)
GFR, Estimated: >60 mL/min (ref >60)
Glucose, Bld: 115 mg/dL — ABNORMAL HIGH (ref 70–99)
Potassium: 4.2 mmol/L (ref 3.5–5.1)
Sodium: 141 mmol/L (ref 135–145)
Total Bilirubin: 0.8 mg/dL (ref 0.0–1.2)
Total Protein: 6.6 g/dL (ref 6.5–8.1)

## 2024-04-11 LAB — CBC WITH DIFFERENTIAL/PLATELET
Abs Immature Granulocytes: 0.04 10*3/uL (ref 0.00–0.07)
Basophils Absolute: 0.1 10*3/uL (ref 0.0–0.1)
Basophils Relative: 1 %
Eosinophils Absolute: 0.2 10*3/uL (ref 0.0–0.5)
Eosinophils Relative: 2 %
HCT: 40.4 % (ref 39.0–52.0)
Hemoglobin: 12.8 g/dL — ABNORMAL LOW (ref 13.0–17.0)
Immature Granulocytes: 1 %
Lymphocytes Relative: 16 %
Lymphs Abs: 1.2 10*3/uL (ref 0.7–4.0)
MCH: 28 pg (ref 26.0–34.0)
MCHC: 31.7 g/dL (ref 30.0–36.0)
MCV: 88.4 fL (ref 80.0–100.0)
Monocytes Absolute: 0.4 10*3/uL (ref 0.1–1.0)
Monocytes Relative: 6 %
Neutro Abs: 5.3 10*3/uL (ref 1.7–7.7)
Neutrophils Relative %: 74 %
Platelets: 198 10*3/uL (ref 150–400)
RBC: 4.57 MIL/uL (ref 4.22–5.81)
RDW: 16.2 % — ABNORMAL HIGH (ref 11.5–15.5)
WBC: 7.2 10*3/uL (ref 4.0–10.5)
nRBC: 0 % (ref 0.0–0.2)

## 2024-04-11 LAB — MAGNESIUM: Magnesium: 2 mg/dL (ref 1.7–2.4)

## 2024-04-11 LAB — ETHANOL: Alcohol, Ethyl (B): 299 mg/dL — ABNORMAL HIGH (ref <15)

## 2024-04-11 MED ORDER — KETOROLAC TROMETHAMINE 15 MG/ML IJ SOLN: 15.0000 mg | Freq: Four times a day (QID) | INTRAMUSCULAR | Status: DC | PRN

## 2024-04-11 MED ORDER — MORPHINE SULFATE (PF) 4 MG/ML IV SOLN
4.0000 mg | Freq: Once | INTRAVENOUS | Status: AC
  Administered 2024-04-11: 4 mg via INTRAVENOUS
  Filled 2024-04-11: qty 1

## 2024-04-11 MED ORDER — ALBUTEROL SULFATE (2.5 MG/3ML) 0.083% IN NEBU: 2.5000 mg | INHALATION_SOLUTION | RESPIRATORY_TRACT | Status: DC | PRN

## 2024-04-11 MED ORDER — OXYCODONE HCL 5 MG PO TABS
5.0000 mg | ORAL_TABLET | Freq: Once | ORAL | Status: AC
  Administered 2024-04-11: 5 mg via ORAL
  Filled 2024-04-11: qty 1

## 2024-04-11 MED ORDER — FLUTICASONE FUROATE-VILANTEROL 100-25 MCG/ACT IN AEPB
1.0000 | INHALATION_SPRAY | Freq: Every day | RESPIRATORY_TRACT | Status: DC
  Filled 2024-04-11: qty 28

## 2024-04-11 MED ORDER — KETOROLAC TROMETHAMINE 15 MG/ML IJ SOLN
15.0000 mg | Freq: Four times a day (QID) | INTRAMUSCULAR | Status: DC | PRN
  Administered 2024-04-11 – 2024-04-12 (×2): 15 mg via INTRAVENOUS
  Filled 2024-04-11 (×2): qty 1

## 2024-04-11 MED ORDER — LIDOCAINE 5 % EX PTCH
1.0000 | MEDICATED_PATCH | CUTANEOUS | Status: DC
  Administered 2024-04-11: 1 via TRANSDERMAL
  Filled 2024-04-11: qty 1

## 2024-04-11 MED ORDER — NALTREXONE HCL 50 MG PO TABS: 50.0000 mg | ORAL_TABLET | Freq: Every day | ORAL | Status: DC

## 2024-04-11 MED ORDER — FERROUS SULFATE 325 (65 FE) MG PO TABS
325.0000 mg | ORAL_TABLET | Freq: Every day | ORAL | Status: DC
  Administered 2024-04-12: 325 mg via ORAL
  Filled 2024-04-11: qty 1

## 2024-04-11 MED ORDER — ONDANSETRON HCL 4 MG/2ML IJ SOLN: 4.0000 mg | Freq: Four times a day (QID) | INTRAMUSCULAR | Status: DC | PRN

## 2024-04-11 MED ORDER — ORAL CARE MOUTH RINSE: 15.0000 mL | OROMUCOSAL | Status: DC | PRN

## 2024-04-11 MED ORDER — THIAMINE MONONITRATE 100 MG PO TABS
100.0000 mg | ORAL_TABLET | Freq: Every day | ORAL | Status: DC
  Administered 2024-04-11 – 2024-04-12 (×2): 100 mg via ORAL
  Filled 2024-04-11 (×2): qty 1

## 2024-04-11 MED ORDER — MORPHINE SULFATE (PF) 2 MG/ML IV SOLN: 2.0000 mg | INTRAVENOUS | Status: DC | PRN

## 2024-04-11 MED ORDER — LORAZEPAM 1 MG PO TABS: 1.0000 mg | ORAL_TABLET | ORAL | Status: DC | PRN

## 2024-04-11 MED ORDER — THIAMINE MONONITRATE 100 MG PO TABS: 100.0000 mg | ORAL_TABLET | Freq: Every day | ORAL | Status: DC

## 2024-04-11 MED ORDER — FOLIC ACID 1 MG PO TABS: 1.0000 mg | ORAL_TABLET | Freq: Every day | ORAL | Status: DC

## 2024-04-11 MED ORDER — LORAZEPAM 2 MG/ML IJ SOLN
1.0000 mg | INTRAMUSCULAR | Status: DC | PRN
  Administered 2024-04-11: 1 mg via INTRAVENOUS
  Filled 2024-04-11: qty 1

## 2024-04-11 MED ORDER — ACETAMINOPHEN 500 MG PO TABS
1000.0000 mg | ORAL_TABLET | Freq: Once | ORAL | Status: AC
  Administered 2024-04-11: 1000 mg via ORAL
  Filled 2024-04-11: qty 2

## 2024-04-11 MED ORDER — ACETAMINOPHEN 650 MG RE SUPP: 650.0000 mg | Freq: Four times a day (QID) | RECTAL | Status: DC | PRN

## 2024-04-11 MED ORDER — ADULT MULTIVITAMIN W/MINERALS CH: 1.0000 | ORAL_TABLET | Freq: Every day | ORAL | Status: DC

## 2024-04-11 MED ORDER — ALLOPURINOL 100 MG PO TABS
100.0000 mg | ORAL_TABLET | Freq: Every day | ORAL | Status: DC
  Administered 2024-04-12: 100 mg via ORAL
  Filled 2024-04-11: qty 1

## 2024-04-11 MED ORDER — LORAZEPAM 2 MG/ML IJ SOLN
1.0000 mg | INTRAMUSCULAR | Status: DC | PRN
  Administered 2024-04-12: 2 mg via INTRAVENOUS
  Filled 2024-04-11: qty 1

## 2024-04-11 MED ORDER — ALBUTEROL SULFATE HFA 108 (90 BASE) MCG/ACT IN AERS: 2.0000 | INHALATION_SPRAY | RESPIRATORY_TRACT | Status: DC | PRN

## 2024-04-11 MED ORDER — ADULT MULTIVITAMIN W/MINERALS CH
1.0000 | ORAL_TABLET | Freq: Every day | ORAL | Status: DC
  Administered 2024-04-11 – 2024-04-12 (×2): 1 via ORAL
  Filled 2024-04-11 (×2): qty 1

## 2024-04-11 MED ORDER — BISACODYL 5 MG PO TBEC
5.0000 mg | DELAYED_RELEASE_TABLET | Freq: Every day | ORAL | Status: DC | PRN
Start: 2024-04-11 — End: 2024-04-12

## 2024-04-11 MED ORDER — DICLOFENAC SODIUM 1 % EX GEL: 4.0000 g | Freq: Three times a day (TID) | CUTANEOUS | Status: DC | PRN

## 2024-04-11 MED ORDER — FOLIC ACID 1 MG PO TABS
1.0000 mg | ORAL_TABLET | Freq: Every day | ORAL | Status: DC
Start: 2024-04-11 — End: 2024-04-12
  Administered 2024-04-11 – 2024-04-12 (×2): 1 mg via ORAL
  Filled 2024-04-11 (×2): qty 1

## 2024-04-11 MED ORDER — DOCUSATE SODIUM 100 MG PO CAPS
100.0000 mg | ORAL_CAPSULE | Freq: Two times a day (BID) | ORAL | Status: DC
  Administered 2024-04-12: 100 mg via ORAL
  Filled 2024-04-11: qty 1

## 2024-04-11 MED ORDER — ONDANSETRON HCL 4 MG PO TABS: 4.0000 mg | ORAL_TABLET | Freq: Four times a day (QID) | ORAL | Status: DC | PRN

## 2024-04-11 MED ORDER — MORPHINE SULFATE (PF) 4 MG/ML IV SOLN
4.0000 mg | INTRAVENOUS | Status: DC | PRN
  Administered 2024-04-11 – 2024-04-12 (×3): 4 mg via INTRAVENOUS
  Filled 2024-04-11 (×3): qty 1

## 2024-04-11 MED ORDER — ACETAMINOPHEN 325 MG PO TABS
650.0000 mg | ORAL_TABLET | Freq: Four times a day (QID) | ORAL | Status: DC | PRN
Start: 2024-04-11 — End: 2024-04-16

## 2024-04-11 MED ORDER — LISINOPRIL 20 MG PO TABS
40.0000 mg | ORAL_TABLET | Freq: Every day | ORAL | Status: DC
  Administered 2024-04-12: 40 mg via ORAL
  Filled 2024-04-11: qty 2

## 2024-04-11 MED ORDER — SERTRALINE HCL 50 MG PO TABS
100.0000 mg | ORAL_TABLET | Freq: Every day | ORAL | Status: DC
  Administered 2024-04-12: 100 mg via ORAL
  Filled 2024-04-11: qty 2

## 2024-04-11 MED ORDER — TRAZODONE HCL 100 MG PO TABS: 100.0000 mg | ORAL_TABLET | Freq: Every evening | ORAL | Status: DC | PRN

## 2024-04-11 MED ORDER — HEPARIN SODIUM (PORCINE) 5000 UNIT/ML IJ SOLN
5000.0000 [IU] | Freq: Three times a day (TID) | INTRAMUSCULAR | Status: DC
  Administered 2024-04-11 – 2024-04-12 (×2): 5000 [IU] via SUBCUTANEOUS
  Filled 2024-04-11 (×2): qty 1

## 2024-04-11 MED ORDER — THIAMINE HCL 100 MG/ML IJ SOLN
100.0000 mg | Freq: Every day | INTRAMUSCULAR | Status: DC
  Filled 2024-04-11: qty 2

## 2024-04-11 MED ORDER — METOPROLOL SUCCINATE ER 25 MG PO TB24
25.0000 mg | ORAL_TABLET | Freq: Every day | ORAL | Status: DC
  Administered 2024-04-12: 25 mg via ORAL
  Filled 2024-04-11: qty 1

## 2024-04-11 NOTE — Assessment & Plan Note (Signed)
 Home lisinopril  40 mg daily, metoprolol  succinate 25 mg daily were resumed

## 2024-04-11 NOTE — Assessment & Plan Note (Addendum)
 Secondary to multiple rib fracture Symptomatic support: Acetaminophen  650 p.o./rectal every 6 hours as needed for mild pain, fever, headache; Toradol  15 mg IV every 6 hours as needed for moderate pain, 1 day ordered; morphine  2 mg IV every 4 hours as needed for severe pain, 1 day ordered; morphine  4 mg IV every 4 hours as needed for severe pain not relieved with morphine  2 mg IV, 20 hours ordered

## 2024-04-11 NOTE — Hospital Course (Addendum)
 [name] is a 63 year old male with history of alcohol dependence and abuse, hypertension, depression, anxiety, who presents emergency department for chief concerns of a fall at home.  Vitals in the ED showed temperature of 96.3, respiration rate 22, heart rate 84, blood pressure 161/98, SpO2 100% on room air.  Serum sodium 141, potassium 4.2, chloride 107, bicarb 25, BUN of 12, serum creatinine 1.10, EGFR greater than 60, nonfasting glucose 115, WBC 7.2, hemoglobin 12.8, platelets of 198.  ED treatment: Acetaminophen  1000 mg p.o. one-time dose, oxycodone  5 mg p.o. one-time dose, morphine  4 mg IV one-time dose.

## 2024-04-11 NOTE — Assessment & Plan Note (Signed)
Home sertraline 100 mg daily resumed

## 2024-04-11 NOTE — ED Notes (Signed)
 Patient's wife called per patient request to update on patient's status.

## 2024-04-11 NOTE — ED Provider Notes (Signed)
 [facility] Provider Note    Event Date/Time   First MD Initiated Contact with Patient 04/11/24 1747     (approximate)   History   Fall   HPI  Miguel [name] is a 63 y.o. male who presents to the ED for evaluation of Fall   I am readily familiar with this patient.  History of alcoholism  Patient presents to the ED after slipping and falling on a wet ramp going into his house.  His wife has MS and he was walking up this middle ramp, it is raining today, slipped and fell and struck the left side of his chest wall and reports pain to his left lateral ribs.  No head trauma, syncope or other concerns.   Physical Exam   Triage Vital Signs: ED Triage Vitals [04/11/24 1748]  Encounter Vitals Group     BP      Systolic BP Percentile      Diastolic BP Percentile      Pulse      Resp      Temp      Temp src      SpO2 94 %     Weight      Height      Head Circumference      Peak Flow      Pain Score      Pain Loc      Pain Education      Exclude from Growth Chart     Most recent vital signs: Vitals:   04/11/24 1810 04/11/24 1910  BP:    Pulse:    Resp:    Temp: (!) 96.3 F (35.7 C) (!) 95.8 F (35.4 C)  SpO2:      General: Awake, no distress.  CV:  Good peripheral perfusion.  Resp:  Normal effort.  Abd:  No distention.  Soft benign abdomen without tenderness, guarding or peritoneal features MSK:  No deformity noted.  Palpation of all 4 extremities without evidence of deformity, tenderness or trauma.  Has tenderness over the left lateral and anterior ribs, inferiorly, around T8-12. Neuro:  No focal deficits appreciated. Other:     ED Results / Procedures / Treatments   Labs (all labs ordered are listed, but only abnormal results are displayed) Labs Reviewed  COMPREHENSIVE METABOLIC PANEL WITH GFR  BASIC METABOLIC PANEL WITH GFR  CBC  CBC WITH DIFFERENTIAL/PLATELET  ETHANOL  MAGNESIUM     EKG Sinus rhythm with a rate of 86 bpm.   Normal axis and intervals.  No clear signs of acute ischemia.  RADIOLOGY Plain film of the chest interpreted by me with multiple mildly displaced rib fractures on the left side.  No pneumothorax  Official radiology report(s): DG Ribs Unilateral W/Chest Left Result Date: 04/11/2024 CLINICAL DATA:  Fall with reported EXAM: LEFT RIBS AND CHEST - 3+ VIEW COMPARISON:  Chest x-ray 01/13/2024 FINDINGS: Single-view chest demonstrates no acute airspace disease or pleural effusion. Stable cardiomediastinal silhouette. No pneumothorax. Left rib series demonstrates acute mildly displaced left fifth, sixth and seventh lateral rib fractures. IMPRESSION: Acute mildly displaced left fifth, sixth and seventh lateral rib fractures. No pneumothorax. Electronically Signed   By: Esmeralda Hedge M.D.   On: 04/11/2024 19:02    PROCEDURES and INTERVENTIONS:  Procedures  Medications  lidocaine  (LIDODERM ) 5 % 1 patch (1 patch Transdermal Patch Applied 04/11/24 1810)  acetaminophen  (TYLENOL ) tablet 650 mg (has no administration in time range)    Or  acetaminophen  (TYLENOL )  suppository 650 mg (has no administration in time range)  ondansetron  (ZOFRAN ) tablet 4 mg (has no administration in time range)    Or  ondansetron  (ZOFRAN ) injection 4 mg (has no administration in time range)  heparin  injection 5,000 Units (has no administration in time range)  docusate sodium  (COLACE) capsule 100 mg (has no administration in time range)  bisacodyl  (DULCOLAX) EC tablet 5 mg (has no administration in time range)  morphine  (PF) 2 MG/ML injection 2 mg (has no administration in time range)  morphine  (PF) 4 MG/ML injection 4 mg (has no administration in time range)  ketorolac  (TORADOL ) 15 MG/ML injection 15 mg (has no administration in time range)  LORazepam  (ATIVAN ) injection 1 mg (has no administration in time range)  acetaminophen  (TYLENOL ) tablet 1,000 mg (1,000 mg Oral Given 04/11/24 1811)  oxyCODONE  (Oxy IR/ROXICODONE ) immediate  release tablet 5 mg (5 mg Oral Given 04/11/24 1811)  morphine  (PF) 4 MG/ML injection 4 mg (4 mg Intravenous Given 04/11/24 1927)     IMPRESSION / MDM / ASSESSMENT AND PLAN / ED COURSE  I reviewed the triage vital signs and the nursing notes.  Differential diagnosis includes, but is not limited to, pneumothorax, rib fracture, muscular pain, solid organ abdominal injury  {Patient presents with symptoms of an acute illness or injury that is potentially life-threatening.  Patient presents after mechanical fall with left-sided rib pain.  Localized tenderness on exam without additional signs of trauma or acute pathology.  Will provide analgesia, x-rays and reassess  X-rays with multiple rib fractures in the left without pneumothorax.  His pain is poorly controlled.  We will initiate incentive spirometer, pain control and consult medicine for admission.      FINAL CLINICAL IMPRESSION(S) / ED DIAGNOSES   Final diagnoses:  Closed fracture of multiple ribs of left side, initial encounter     Rx / DC Orders   ED Discharge Orders     None        Note:  This document was prepared using Dragon voice recognition software and may include unintentional dictation errors.   Arline Bennett, MD 04/11/24 2010

## 2024-04-11 NOTE — Assessment & Plan Note (Signed)
 Secondary to mechanical fall, treatment per above including I-S, flutter valve, symptomatic pain support

## 2024-04-11 NOTE — Assessment & Plan Note (Signed)
 CIWA precaution initiated

## 2024-04-11 NOTE — ED Triage Notes (Signed)
 Patient to ED via [facility] from home due to a fall. Patient states he tripped and hit his head. Patient also mentions that he passed out. Patient does not take blood thinners. Patient now complaining of 10/10 pain in his L shoulder and L rib. Patient also endorses SOB.

## 2024-04-11 NOTE — H&P (Addendum)
 History and Physical   Miguel [name] UEA:[id number] DOB: February 09, 1961 DOA: 04/11/2024  PCP: Normie Becton, FNP Patient coming from: home via EMS  I have personally briefly reviewed patient's old medical records in [facility] EMR.  Chief Concern: Fall, hurting  HPI: [name] is a 63 year old male with history of alcohol dependence and abuse, hypertension, depression, anxiety, who presents emergency department for chief concerns of a fall at home.  Vitals in the ED showed temperature of 96.3, respiration rate 22, heart rate 84, blood pressure 161/98, SpO2 100% on room air.  Serum sodium 141, potassium 4.2, chloride 107, bicarb 25, BUN of 12, serum creatinine 1.10, EGFR greater than 60, nonfasting glucose 115, WBC 7.2, hemoglobin 12.8, platelets of 198.  ED treatment: Acetaminophen  1000 mg p.o. one-time dose, oxycodone  5 mg p.o. one-time dose, morphine  4 mg IV one-time dose. ------------------------------- At bedside, patient able to tell me his first and last name, age, location, current calendar year.  He reports that he was walking up the ramp today and he fell.  He thinks that he hit his head and lost consciousness.  He reports he thinks that the last time he had alcohol was yesterday, gin and vodka and a mixed drink.  He states his wife is currently at home.  He currently denies abdominal pain, dysuria, hematuria, diarrhea, swelling of his lower extremities.  Social history: He lives at home with his wife who has MS.  He is currently a daily alcohol user.  He denies record patient with drug use.,  ROS: Constitutional: no weight change, no fever ENT/Mouth: no sore throat, no rhinorrhea Eyes: no eye pain, no vision changes Cardiovascular: no chest pain, no dyspnea,  no edema, no palpitations Respiratory: no cough, no sputum, no wheezing Gastrointestinal: no nausea, no vomiting, no diarrhea, no constipation Genitourinary: no urinary incontinence, no dysuria, no  hematuria Musculoskeletal: no arthralgias, no myalgias Skin: no skin lesions, no pruritus, Neuro: + weakness, no loss of consciousness, no syncope Psych: no anxiety, no depression, + decrease appetite Heme/Lymph: no bruising, no bleeding  ED Course: Discussed with EDP, patient requiring hospitalization for chief concerns of multiple rib fracture requiring pain control.  Assessment/Plan  Principal Problem:   Inadequate pain control Active Problems:   Alcohol abuse   Hypertension   Generalized anxiety disorder   Mixed hyperlipidemia   Syncope   Severe recurrent major 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 (HCC)   Adjustment disorder with mixed disturbance of emotions and conduct   Alcohol abuse with intoxication (HCC)   Multiple rib fractures   Assessment and Plan:  * Inadequate pain control Secondary to multiple rib fracture Symptomatic support: Acetaminophen  650 p.o./rectal every 6 hours as needed for mild pain, fever, headache; Toradol  15 mg IV every 6 hours as needed for moderate pain, 1 day ordered; morphine  2 mg IV every 4 hours as needed for severe pain, 1 day ordered; morphine  4 mg IV every 4 hours as needed for severe pain not relieved with morphine  2 mg IV, 20 hours ordered  Alcohol abuse CIWA precaution initiated  Hypertension Home lisinopril  40 mg daily, metoprolol  succinate 25 mg daily were resumed  Multiple rib fractures Secondary to mechanical fall, treatment per above including I-S, flutter valve, symptomatic pain support  Adjustment disorder with mixed disturbance of emotions and conduct Home sertraline  100 mg daily resumed  Chart reviewed.   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    OCD (obsessive compulsive disorder)    Renal colic    Past Surgical History:  Procedure Laterality Date   CHOLECYSTECTOMY  2012   COLONOSCOPY     COLONOSCOPY N/A 12/24/2022   Procedure: COLONOSCOPY;  Surgeon: Shane Darling, MD;  Location: [facility] ENDOSCOPY;  Service: Endoscopy;  Laterality: N/A;   COLONOSCOPY WITH PROPOFOL  N/A 05/28/2020   Procedure: COLONOSCOPY WITH PROPOFOL ;  Surgeon: Luke Salaam, MD;  Location: [facility] ENDOSCOPY;  Service: Gastroenterology;  Laterality: N/A;   ESOPHAGOGASTRODUODENOSCOPY N/A 12/24/2022   Procedure: ESOPHAGOGASTRODUODENOSCOPY (EGD);  Surgeon: Shane Darling, MD;  Location: [facility] ENDOSCOPY;  Service: Endoscopy;  Laterality: N/A;   EXTRACORPOREAL SHOCK WAVE LITHOTRIPSY Left 01/12/2019   Procedure: EXTRACORPOREAL SHOCK WAVE LITHOTRIPSY (ESWL);  Surgeon: Lawerence Pressman, MD;  Location: [facility] ORS;  Service: Urology;  Laterality: Left;   UPPER GI ENDOSCOPY     Social History:  reports that he quit smoking about 43 years ago. His smoking use included cigarettes. He has never used smokeless tobacco. He reports current alcohol use of about 24.0 standard drinks of alcohol per week. He reports that he does not currently use drugs after having used the following drugs: Marijuana.  No Known Allergies Family History  Problem Relation Age of Onset   Alcohol abuse Father    Breast cancer Mother 72   Anxiety disorder Brother    Other Maternal Grandmother        unknown medical history   Other Maternal Grandfather        unknow medical history   Other Paternal Grandmother        unknown medical history   Other Paternal Grandfather         unknown medical history   Healthy Brother    Family history: Family history reviewed and not pertinent.  Prior to Admission medications   Medication Sig Start Date End Date Taking? Authorizing Provider  albuterol  (VENTOLIN  HFA) 108 (90 Base) MCG/ACT inhaler Inhale 2 puffs into the lungs every 4 (four) hours as needed for wheezing or shortness of breath. 12/23/23   Arline Bennett, MD  allopurinol  (ZYLOPRIM ) 100 MG tablet Take 1 tablet (100 mg total) by mouth daily. 04/16/23 04/10/24  Normie Becton, FNP  colchicine  0.6 MG tablet Take 1 tablet (0.6 mg total) by mouth 2 (two) times daily. 06/01/23   Montey Apa, DO  diclofenac  Sodium (VOLTAREN ) 1 % GEL Apply 4 g topically 3 (three) times daily as needed (left chest wall pain). 09/23/23   Loman Risk, MD  ferrous sulfate  325 (65 FE) MG tablet Take 1 tablet (325 mg total) by mouth daily with breakfast. 06/02/23   Darus Engels A, DO  fluticasone -salmeterol (ADVAIR ) 100-50 MCG/ACT AEPB Inhale 1 puff into the lungs 2 (two) times daily. 04/20/23   Normie Becton, FNP  folic acid  (FOLVITE ) 1 MG tablet Take 1 tablet (1 mg total) by mouth daily. 06/01/23  Darus Engels A, DO  lisinopril  (ZESTRIL ) 40 MG tablet TAKE ONE TABLET BY MOUTH ONCE EVERY DAY. 04/16/23   Normie Becton, FNP  metoprolol  succinate (TOPROL  XL) 25 MG 24 hr tablet Take 1 tablet (25 mg total) by mouth daily. 04/16/23   Normie Becton, FNP  Multiple Vitamin (MULTIVITAMIN WITH MINERALS) TABS tablet Take 1 tablet by mouth daily. 06/01/23   Montey Apa, DO  naltrexone  (DEPADE) 50 MG tablet Take 1 tablet (50 mg total) by mouth daily. 06/18/23   Ostwalt, Janna, PA-C  naltrexone  (DEPADE) 50 MG tablet Take 1 tablet (50 mg total) by mouth daily. 12/23/23   Arline Bennett, MD  predniSONE  (DELTASONE ) 10 MG tablet Take 1 tablet (10 mg total) by mouth daily. Day 1-3: take 4 tablets PO daily Day 4-6: take 3 tablets PO daily Day 7-9: take 2 tablets PO daily Day 10-12: take 1 tablet PO daily 01/13/24    Paduchowski, Kevin, MD  sertraline  (ZOLOFT ) 100 MG tablet Take 1 tablet (100 mg total) by mouth daily. 12/23/23 12/22/24  Arline Bennett, MD  sertraline  (ZOLOFT ) 50 MG tablet Take 1 tablet (50 mg total) by mouth daily. 04/16/23   Normie Becton, FNP  thiamine  (VITAMIN B-1) 100 MG tablet Take 1 tablet (100 mg total) by mouth daily. 06/01/23   Montey Apa, DO  traZODone  (DESYREL ) 100 MG tablet Take 1 tablet (100 mg total) by mouth at bedtime as needed. 04/20/23   Normie Becton, FNP   Physical Exam: Vitals:   04/11/24 1810 04/11/24 1910 04/11/24 2024 04/11/24 2048  BP:   (!) 145/95 (!) 155/97  Pulse:   88 (!) 101  Resp:   14 18  Temp: (!) 96.3 F (35.7 C) (!) 95.8 F (35.4 C)  97.7 F (36.5 C)  TempSrc: Rectal Rectal  Oral  SpO2:   100% 97%  Weight:      Height:       Constitutional: appears older than chronological age, poorly kept, with odors of alcohol Eyes: PERRL, lids and conjunctivae normal ENMT: Mucous membranes are moist. Posterior pharynx clear of any exudate or lesions. Age-appropriate dentition. Hearing appropriate Neck: normal, supple, no masses, no thyromegaly Respiratory: clear to auscultation bilaterally, no wheezing, no crackles. Normal respiratory effort. No accessory muscle use.  Cardiovascular: Regular rate and rhythm, no murmurs / rubs / gallops. No extremity edema. 2+ pedal pulses. No carotid bruits.  Abdomen: Obese abdomen, no tenderness, no masses palpated, no hepatosplenomegaly. Bowel sounds positive.  Musculoskeletal: no clubbing / cyanosis. No joint deformity upper and lower extremities. Good ROM, no contractures, no atrophy. Normal muscle tone.  Skin: no rashes, lesions, ulcers. No induration Neurologic: Sensation intact. Strength 5/5 in all 4.  Psychiatric: Lacks judgment and insight. Alert and oriented x 3.  Depressed and anxious mood.   EKG: independently reviewed, showing sinus rhythm with rate of 86, QTc 454  Chest x-ray on Admission: I personally reviewed  and I agree with radiologist reading as below.  DG Ribs Unilateral W/Chest Left Result Date: 04/11/2024 CLINICAL DATA:  Fall with reported EXAM: LEFT RIBS AND CHEST - 3+ VIEW COMPARISON:  Chest x-ray 01/13/2024 FINDINGS: Single-view chest demonstrates no acute airspace disease or pleural effusion. Stable cardiomediastinal silhouette. No pneumothorax. Left rib series demonstrates acute mildly displaced left fifth, sixth and seventh lateral rib fractures. IMPRESSION: Acute mildly displaced left fifth, sixth and seventh lateral rib fractures. No pneumothorax. Electronically Signed   By: Esmeralda Hedge M.D.   On: 04/11/2024 19:02  Labs on Admission: I have personally reviewed following labs  CBC: Recent Labs  Lab 04/11/24 2002  WBC 7.2  NEUTROABS 5.3  HGB 12.8*  HCT 40.4  MCV 88.4  PLT 198   Basic Metabolic Panel: Recent Labs  Lab 04/11/24 2002  NA 141  K 4.2  CL 107  CO2 25  GLUCOSE 115*  BUN 12  CREATININE 1.10  CALCIUM  8.6*  MG 2.0   GFR: Estimated Creatinine Clearance: 74.5 mL/min (by C-G formula based on SCr of 1.1 mg/dL).  Liver Function Tests: Recent Labs  Lab 04/11/24 2002  AST 26  ALT <5  ALKPHOS 53  BILITOT 0.8  PROT 6.6  ALBUMIN  3.7   Urine analysis:    Component Value Date/Time   COLORURINE COLORLESS (A) 08/07/2023 2104   APPEARANCEUR CLEAR (A) 08/07/2023 2104   APPEARANCEUR Hazy 06/26/2014 2238   LABSPEC 1.001 (L) 08/07/2023 2104   LABSPEC 1.036 06/26/2014 2238   PHURINE 7.0 08/07/2023 2104   GLUCOSEU NEGATIVE 08/07/2023 2104   GLUCOSEU Negative 06/26/2014 2238   HGBUR SMALL (A) 08/07/2023 2104   BILIRUBINUR NEGATIVE 08/07/2023 2104   BILIRUBINUR 1+ 06/26/2014 2238   KETONESUR NEGATIVE 08/07/2023 2104   PROTEINUR NEGATIVE 08/07/2023 2104   NITRITE NEGATIVE 08/07/2023 2104   LEUKOCYTESUR NEGATIVE 08/07/2023 2104   LEUKOCYTESUR 1+ 06/26/2014 2238   This document was prepared using Dragon Voice Recognition software and may include unintentional  dictation errors.  Dr. Reinhold Carbine [facility]  If 7PM-7AM, please contact overnight-coverage provider If 7AM-7PM, please contact day attending provider [email or web address]  04/11/2024, 9:54 PM

## 2024-04-12 DIAGNOSIS — F1010 Alcohol abuse, uncomplicated: Secondary | ICD-10-CM | POA: Diagnosis not present

## 2024-04-12 DIAGNOSIS — S2242XA Multiple fractures of ribs, left side, initial encounter for closed fracture: Secondary | ICD-10-CM | POA: Diagnosis not present

## 2024-04-12 DIAGNOSIS — R52 Pain, unspecified: Secondary | ICD-10-CM | POA: Diagnosis not present

## 2024-04-12 LAB — BASIC METABOLIC PANEL WITH GFR
Anion gap: 12 (ref 5–15)
BUN: 13 mg/dL (ref 8–23)
CO2: 25 mmol/L (ref 22–32)
Calcium: 8.7 mg/dL — ABNORMAL LOW (ref 8.9–10.3)
Chloride: 107 mmol/L (ref 98–111)
Creatinine, Ser: 0.97 mg/dL (ref 0.61–1.24)
GFR, Estimated: >60 mL/min (ref >60)
Glucose, Bld: 86 mg/dL (ref 70–99)
Potassium: 3.8 mmol/L (ref 3.5–5.1)
Sodium: 144 mmol/L (ref 135–145)

## 2024-04-12 MED ORDER — OXYCODONE-ACETAMINOPHEN 5-325 MG PO TABS: 1.0000 | ORAL_TABLET | Freq: Three times a day (TID) | ORAL | 0 refills | Status: DC | PRN

## 2024-04-12 MED ORDER — LIDOCAINE 5 % EX OINT: 1.0000 | TOPICAL_OINTMENT | CUTANEOUS | 0 refills | Status: AC | PRN

## 2024-04-12 MED ORDER — ACETAMINOPHEN 325 MG PO TABS: 650.0000 mg | ORAL_TABLET | Freq: Four times a day (QID) | ORAL | Status: DC | PRN

## 2024-04-12 MED ORDER — LIDOCAINE 5 % EX PTCH
1.0000 | MEDICATED_PATCH | CUTANEOUS | Status: DC
  Administered 2024-04-12: 1 via TRANSDERMAL
  Filled 2024-04-12: qty 1

## 2024-04-12 NOTE — Plan of Care (Signed)
  Problem: Education: Goal: Knowledge of General Education information will improve Description: Including pain rating scale, medication(s)/side effects and non-pharmacologic comfort measures Outcome: Progressing   Problem: Health Behavior/Discharge Planning: Goal: Ability to manage health-related needs will improve Outcome: Progressing   Problem: Activity: Goal: Risk for activity intolerance will decrease Outcome: Progressing   Problem: Nutrition: Goal: Adequate nutrition will be maintained Outcome: Progressing   Problem: Coping: Goal: Level of anxiety will decrease Outcome: Not Progressing   

## 2024-04-12 NOTE — Discharge Summary (Signed)
 Physician Discharge Summary   Patient: Miguel [name] MRN: [id number] DOB: Sep 21, 1961  Admit date:     04/11/2024  Discharge date: 04/12/24  Discharge Physician: Donaciano Frizzle   PCP: Normie Becton, FNP   Recommendations at discharge:   Follow-up with PCP in 1 week.  Discharge Diagnoses: Principal Problem:   Inadequate pain control Active Problems:   Alcohol abuse   Hypertension   Generalized anxiety disorder   Mixed hyperlipidemia   Syncope   Severe recurrent major 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 (HCC)   Adjustment disorder with mixed disturbance of emotions and conduct   Alcohol abuse with intoxication (HCC)   Multiple rib fractures  Resolved Problems:   Alcoholic ketoacidosis  Hospital Course: Miguel [name] is a 63 year old male with history of alcohol dependence and abuse, hypertension, depression, anxiety, who presents emergency department for chief concerns of a fall at home. Patient tripped on a wet surface, sustained a fall.  Chest x-ray showed mildly displaced left rib fracture involving the 5th and 6th and 7th. Patient was admitted to the hospital for pain control, received some morphine  and as needed Toradol .  Pain better controlled, patient wished to go home. Will prescribe lidocaine  ornament as well as as needed pain medicine.  Patient will be follow-up with PCP as outpatient.  Assessment and Plan: * Inadequate pain control Secondary to multiple rib fracture Pain better controlled, continue as needed pain medicine as well as Tylenol .  Alcohol abuse No alcohol withdrawal.  Hypertension Home lisinopril  40 mg daily, metoprolol  succinate 25 mg daily were resumed  Multiple rib fractures As above, patient also advised to use incentive spirometer and bring home.  Adjustment disorder with mixed disturbance of emotions and conduct Home sertraline  100 mg daily resumed        Consultants: None Procedures performed: None  Disposition: Home Diet  recommendation:  Discharge Diet Orders (From admission, onward)     Start     Ordered   04/12/24 0000  Diet - low sodium heart healthy        04/12/24 1019           Cardiac diet DISCHARGE MEDICATION: Allergies as of 04/12/2024   No Known Allergies      Medication List     STOP taking these medications    predniSONE  10 MG tablet Commonly known as: DELTASONE        TAKE these medications    acetaminophen  325 MG tablet Commonly known as: TYLENOL  Take 2 tablets (650 mg total) by mouth every 6 (six) hours as needed for mild pain (pain score 1-3), fever or headache (or Fever >/= 101).   albuterol  108 (90 Base) MCG/ACT inhaler Commonly known as: VENTOLIN  HFA Inhale 2 puffs into the lungs every 4 (four) hours as needed for wheezing or shortness of breath.   allopurinol  100 MG tablet Commonly known as: Zyloprim  Take 1 tablet (100 mg total) by mouth daily.   colchicine  0.6 MG tablet Take 1 tablet (0.6 mg total) by mouth 2 (two) times daily.   diclofenac  Sodium 1 % Gel Commonly known as: Voltaren  Apply 4 g topically 3 (three) times daily as needed (left chest wall pain).   ferrous sulfate  325 (65 FE) MG tablet Take 1 tablet (325 mg total) by mouth daily with breakfast.   fluticasone -salmeterol 100-50 MCG/ACT Aepb Commonly known as: ADVAIR  Inhale 1 puff into the lungs 2 (two) times daily.   folic acid  1 MG tablet Commonly known as: FOLVITE  Take 1 tablet (1 mg  total) by mouth daily.   lidocaine  5 % ointment Commonly known as: XYLOCAINE  Apply 1 Application topically as needed. Apply to left chest   lisinopril  40 MG tablet Commonly known as: ZESTRIL  TAKE ONE TABLET BY MOUTH ONCE EVERY DAY.   metoprolol  succinate 25 MG 24 hr tablet Commonly known as: Toprol  XL Take 1 tablet (25 mg total) by mouth daily.   multivitamin with minerals Tabs tablet Take 1 tablet by mouth daily.   naltrexone  50 MG tablet Commonly known as: DEPADE Take 1 tablet (50 mg total) by  mouth daily. What changed: Another medication with the same name was removed. Continue taking this medication, and follow the directions you see here.   oxyCODONE -acetaminophen  5-325 MG tablet Commonly known as: Percocet Take 1 tablet by mouth every 8 (eight) hours as needed for severe pain (pain score 7-10).   sertraline  50 MG tablet Commonly known as: ZOLOFT  Take 1 tablet (50 mg total) by mouth daily.   sertraline  100 MG tablet Commonly known as: Zoloft  Take 1 tablet (100 mg total) by mouth daily.   thiamine  100 MG tablet Commonly known as: Vitamin B-1 Take 1 tablet (100 mg total) by mouth daily.   traZODone  100 MG tablet Commonly known as: DESYREL  Take 1 tablet (100 mg total) by mouth at bedtime as needed.        Follow-up Information     Iona Manis T, FNP Follow up in 1 week(s).   Specialty: Family Medicine Contact information: [address] [place] [address] [phone]                Discharge Exam: Miguel [name] Weights   04/11/24 1754  Weight: 85.8 kg   General exam: Appears calm and comfortable  Respiratory system: Clear to auscultation. Respiratory effort normal. Cardiovascular system: S1 & S2 heard, RRR. No JVD, murmurs, rubs, gallops or clicks. No pedal edema. Gastrointestinal system: Abdomen is nondistended, soft and nontender. No organomegaly or masses felt. Normal bowel sounds heard. Central nervous system: Alert and oriented. No focal neurological deficits. Extremities: Symmetric 5 x 5 power. Skin: No rashes, lesions or ulcers Psychiatry: Judgement and insight appear normal. Mood & affect appropriate.    Condition at discharge: good  The results of significant diagnostics from this hospitalization (including imaging, microbiology, ancillary and laboratory) are listed below for reference.   Imaging Studies: DG Ribs Unilateral W/Chest Left Result Date: 04/11/2024 CLINICAL DATA:  Fall with reported EXAM: LEFT RIBS AND CHEST - 3+ VIEW  COMPARISON:  Chest x-ray 01/13/2024 FINDINGS: Single-view chest demonstrates no acute airspace disease or pleural effusion. Stable cardiomediastinal silhouette. No pneumothorax. Left rib series demonstrates acute mildly displaced left fifth, sixth and seventh lateral rib fractures. IMPRESSION: Acute mildly displaced left fifth, sixth and seventh lateral rib fractures. No pneumothorax. Electronically Signed   By: Esmeralda Hedge M.D.   On: 04/11/2024 19:02    Microbiology: Results for orders placed or performed during the hospital encounter of 01/13/24  Resp panel by RT-PCR (RSV, Flu A&B, Covid) Anterior Nasal Swab     Status: None   Collection Time: 01/13/24  3:56 PM   Specimen: Anterior Nasal Swab  Result Value Ref Range Status   SARS Coronavirus 2 by RT PCR NEGATIVE NEGATIVE Final    Comment: (NOTE) SARS-CoV-2 target nucleic acids are NOT DETECTED.  The SARS-CoV-2 RNA is generally detectable in upper respiratory specimens during the acute phase of infection. The lowest concentration of SARS-CoV-2 viral copies this assay can detect is 138 copies/mL. A negative  result does not preclude SARS-Cov-2 infection and should not be used as the sole basis for treatment or other patient management decisions. A negative result may occur with  improper specimen collection/handling, submission of specimen other than nasopharyngeal swab, presence of viral mutation(s) within the areas targeted by this assay, and inadequate number of viral copies(<138 copies/mL). A negative result must be combined with clinical observations, patient history, and epidemiological information. The expected result is Negative.  Fact Sheet for Patients:  BloggerCourse.com  Fact Sheet for Healthcare Providers:  SeriousBroker.it  This test is no t yet approved or cleared by the [place]  FDA and  has been authorized for detection and/or diagnosis of SARS-CoV-2 by FDA  under an Emergency Use Authorization (EUA). This EUA will remain  in effect (meaning this test can be used) for the duration of the COVID-19 declaration under Section 564(b)(1) of the Act, 21 U.S.C.section 360bbb-3(b)(1), unless the authorization is terminated  or revoked sooner.       Influenza A by PCR NEGATIVE NEGATIVE Final   Influenza B by PCR NEGATIVE NEGATIVE Final    Comment: (NOTE) The Xpert Xpress SARS-CoV-2/FLU/RSV plus assay is intended as an aid in the diagnosis of influenza from Nasopharyngeal swab specimens and should not be used as a sole basis for treatment. Nasal washings and aspirates are unacceptable for Xpert Xpress SARS-CoV-2/FLU/RSV testing.  Fact Sheet for Patients: BloggerCourse.com  Fact Sheet for Healthcare Providers: SeriousBroker.it  This test is not yet approved or cleared by the [place]  FDA and has been authorized for detection and/or diagnosis of SARS-CoV-2 by FDA under an Emergency Use Authorization (EUA). This EUA will remain in effect (meaning this test can be used) for the duration of the COVID-19 declaration under Section 564(b)(1) of the Act, 21 U.S.C. section 360bbb-3(b)(1), unless the authorization is terminated or revoked.     Resp Syncytial Virus by PCR NEGATIVE NEGATIVE Final    Comment: (NOTE) Fact Sheet for Patients: BloggerCourse.com  Fact Sheet for Healthcare Providers: SeriousBroker.it  This test is not yet approved or cleared by the [place]  FDA and has been authorized for detection and/or diagnosis of SARS-CoV-2 by FDA under an Emergency Use Authorization (EUA). This EUA will remain in effect (meaning this test can be used) for the duration of the COVID-19 declaration under Section 564(b)(1) of the Act, 21 U.S.C. section 360bbb-3(b)(1), unless the authorization is terminated or revoked.  Performed at [facility], [address] [place], [place] [address]     Labs: CBC: Recent Labs  Lab 04/11/24 2002  WBC 7.2  NEUTROABS 5.3  HGB 12.8*  HCT 40.4  MCV 88.4  PLT 198   Basic Metabolic Panel: Recent Labs  Lab 04/11/24 2002 04/12/24 0408  NA 141 144  K 4.2 3.8  CL 107 107  CO2 25 25  GLUCOSE 115* 86  BUN 12 13  CREATININE 1.10 0.97  CALCIUM  8.6* 8.7*  MG 2.0  --    Liver Function Tests: Recent Labs  Lab 04/11/24 2002  AST 26  ALT <5  ALKPHOS 53  BILITOT 0.8  PROT 6.6  ALBUMIN  3.7   CBG: No results for input(s): "GLUCAP" in the last 168 hours.  Discharge time spent: greater than 30 minutes.  Signed: Donaciano Frizzle, MD [facility] 04/12/2024

## 2024-04-12 NOTE — TOC Initial Note (Signed)
 Transition of Care [facility]) - Initial/Assessment Note    Patient Details  Name: Miguel [name] MRN: [id number] Date of Birth: 1961-09-06  Transition of Care [facility]) CM/SW Contact:    Loman Risk, RN Phone Number: 04/12/2024, 11:05 AM  Clinical Narrative:                    Patient to discharge today Patient declines SA resources Discharge transport arranged through [facility] [employer]     Patient Goals and CMS Choice            Expected Discharge Plan and Services         Expected Discharge Date: 04/12/24                                    Prior Living Arrangements/Services                       Activities of Daily Living   ADL Screening (condition at time of admission) Independently performs ADLs?: Yes (appropriate for developmental age) Is the patient deaf or have difficulty hearing?: No Does the patient have difficulty seeing, even when wearing glasses/contacts?: No Does the patient have difficulty concentrating, remembering, or making decisions?: No  Permission Sought/Granted                  Emotional Assessment              Admission diagnosis:  Inadequate pain control [R52] Closed fracture of multiple ribs of left side, initial encounter [S22.42XA] Patient Active Problem List   Diagnosis Date Noted   Inadequate pain control 04/11/2024   Multiple rib fractures 04/11/2024   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   Alcohol abuse with intoxication (HCC) 12/23/2023   Involved in legal proceedings 12/08/2023   Marital conflict 12/08/2023   Fall 10/21/2023   Iron  deficiency 05/28/2023   Alcohol withdrawal with inpatient treatment (HCC) 05/24/2023   Total bilirubin, elevated 05/24/2023   Traumatic ecchymosis 05/24/2023   Hypokalemia 04/29/2023   Hypocalcemia 04/29/2023   Essential hypertension 04/29/2023   Depression 04/29/2023   Syncope 04/28/2023   Encounter for screening for HIV  04/16/2023   Encounter for hepatitis C screening test for low risk patient 04/16/2023   Establishing care with new doctor, encounter for 04/16/2023   Hyperthyroidism 04/16/2023   Elevated serum glucose 04/16/2023   Mixed hyperlipidemia 04/16/2023   Centrilobular emphysema (HCC) 04/16/2023   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) 04/16/2023   Orthostatic hypotension 11/28/2020   Genetic testing 07/15/2020   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   PCP:  Normie Becton, FNP Pharmacy:   [facility] [address] [place] [address] Phone: [phone] Fax: [phone]  [facility] - [place] ([place]), [place] - [address]  [place] [address] Phone: [phone] Fax: [phone]  [facility] - [facility] [address] [place] [address] Phone: [phone] Fax: [phone]  [facility] [address] [place] - [address] [place] [address] Phone: [phone] Fax: [phone]  [id number] [place], [place] - [address] [place] [address] [place] [address] Phone: [phone] Fax: [phone]     Social Drivers of Health (SDOH) Social History: SDOH Screenings   Food Insecurity: No Food Insecurity (04/11/2024)  Housing: Low Risk   (04/11/2024)  Transportation Needs: No Transportation Needs (04/11/2024)  Utilities: Not At Risk (04/11/2024)  Alcohol Screen: Medium Risk (03/19/2021)  Depression (PHQ2-9): Low Risk  (04/16/2023)  Financial Resource Strain: High Risk (09/18/2020)  Physical Activity: Sufficiently Active (09/18/2020)  Social Connections: Socially Isolated (09/18/2020)  Stress: Stress Concern Present (09/18/2020)  Tobacco Use: Medium Risk (04/11/2024)   SDOH Interventions:     Readmission Risk Interventions    05/25/2023    4:31 PM 01/19/2023   12:40 PM  Readmission Risk Prevention Plan  Transportation Screening Complete Complete  Medication Review (RN Care Manager) Referral to Pharmacy Complete  PCP or Specialist appointment within 3-5 days of discharge Complete Complete  HRI or Home Care Consult Complete   SW Recovery Care/Counseling Consult Complete Complete  Palliative Care Screening Not Applicable Not Applicable  Skilled Nursing Facility Not Applicable Not Applicable

## 2024-04-13 ENCOUNTER — Other Ambulatory Visit: Payer: Self-pay

## 2024-04-13 ENCOUNTER — Telehealth: Payer: Self-pay | Admitting: Family Medicine

## 2024-04-13 DIAGNOSIS — G4709 Other insomnia: Secondary | ICD-10-CM

## 2024-04-13 MED ORDER — TRAZODONE HCL 100 MG PO TABS
100.0000 mg | ORAL_TABLET | Freq: Every evening | ORAL | 0 refills | Status: DC | PRN
  Filled 2024-04-13: qty 30, 30d supply, fill #0

## 2024-04-13 NOTE — Telephone Encounter (Signed)
 [facility] faxed refill request for the following medications:   traZODone  (DESYREL ) 100 MG table    Please advise.

## 2024-04-16 ENCOUNTER — Other Ambulatory Visit: Payer: Self-pay

## 2024-04-16 ENCOUNTER — Emergency Department
Admission: EM | Admit: 2024-04-16 | Discharge: 2024-04-16 | Disposition: A | Discharge status: Home / Self Care | Payer: MEDICAID | Attending: Emergency Medicine | Admitting: Emergency Medicine

## 2024-04-16 ENCOUNTER — Emergency Department: Payer: MEDICAID

## 2024-04-16 DIAGNOSIS — R0781 Pleurodynia: Secondary | ICD-10-CM | POA: Diagnosis present

## 2024-04-16 DIAGNOSIS — Z7951 Long term (current) use of inhaled steroids: Secondary | ICD-10-CM | POA: Diagnosis not present

## 2024-04-16 DIAGNOSIS — J45909 Unspecified asthma, uncomplicated: Secondary | ICD-10-CM | POA: Insufficient documentation

## 2024-04-16 DIAGNOSIS — I10 Essential (primary) hypertension: Secondary | ICD-10-CM | POA: Diagnosis not present

## 2024-04-16 DIAGNOSIS — W19XXXA Unspecified fall, initial encounter: Secondary | ICD-10-CM | POA: Diagnosis not present

## 2024-04-16 DIAGNOSIS — Z79899 Other long term (current) drug therapy: Secondary | ICD-10-CM | POA: Insufficient documentation

## 2024-04-16 MED ORDER — OXYCODONE-ACETAMINOPHEN 5-325 MG PO TABS
1.0000 | ORAL_TABLET | Freq: Once | ORAL | Status: DC
  Filled 2024-04-16: qty 1

## 2024-04-16 MED ORDER — LORAZEPAM 2 MG/ML IJ SOLN
2.0000 mg | Freq: Once | INTRAMUSCULAR | Status: AC
  Administered 2024-04-16: 2 mg via INTRAMUSCULAR
  Filled 2024-04-16: qty 1

## 2024-04-16 MED ORDER — OXYCODONE-ACETAMINOPHEN 5-325 MG PO TABS
1.0000 | ORAL_TABLET | Freq: Once | ORAL | Status: AC
  Administered 2024-04-16: 1 via ORAL
  Filled 2024-04-16: qty 1

## 2024-04-16 MED ORDER — OXYCODONE-ACETAMINOPHEN 5-325 MG PO TABS: 1.0000 | ORAL_TABLET | Freq: Three times a day (TID) | ORAL | 0 refills | Status: DC | PRN

## 2024-04-16 NOTE — ED Notes (Signed)
 Fall precautions in place as best as possible for Pt. This RN placed fall band, pt declined fall grip socks, Pt in hallway near nurse desk.

## 2024-04-16 NOTE — ED Provider Notes (Signed)
 [facility] Provider Note    Event Date/Time   First MD Initiated Contact with Patient 04/16/24 [phone]     (approximate)   History   Rib Injury   HPI  Miguel [name] is a 63 y.o. male brought to the ED via EMS from home with continued left-sided rib pain.  Patient with recent hospitalization for pain control following med chemical fall with left rib fractures.  Denies fever/chills, shortness of breath, cough, abdominal pain, nausea, vomiting or dizziness.     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   Alcohol abuse with intoxication (HCC) 12/23/2023   Involved in legal proceedings 12/08/2023   Marital conflict 12/08/2023   Fall 10/21/2023   Iron  deficiency 05/28/2023   Alcohol withdrawal with inpatient treatment (HCC) 05/24/2023   Total bilirubin, elevated 05/24/2023   Traumatic ecchymosis 05/24/2023   Hypokalemia 04/29/2023   Hypocalcemia 04/29/2023   Essential hypertension 04/29/2023   Depression 04/29/2023   Syncope 04/28/2023   Encounter for screening for HIV 04/16/2023   Encounter for hepatitis C screening test for low risk patient 04/16/2023   Establishing care with new doctor, encounter for 04/16/2023   Hyperthyroidism 04/16/2023   Elevated serum glucose 04/16/2023   Mixed hyperlipidemia 04/16/2023   Centrilobular emphysema (HCC) 04/16/2023   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)  04/16/2023   Orthostatic hypotension 11/28/2020   Genetic testing 07/15/2020   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     Past Surgical History   Past Surgical History:  Procedure Laterality Date   CHOLECYSTECTOMY  2012   COLONOSCOPY     COLONOSCOPY N/A 12/24/2022   Procedure: COLONOSCOPY;  Surgeon: Shane Darling, MD;  Location: [facility] ENDOSCOPY;  Service: Endoscopy;  Laterality: N/A;   COLONOSCOPY WITH PROPOFOL  N/A 05/28/2020   Procedure: COLONOSCOPY WITH PROPOFOL ;  Surgeon: Luke Salaam, MD;  Location: [facility] ENDOSCOPY;  Service: Gastroenterology;  Laterality: N/A;   ESOPHAGOGASTRODUODENOSCOPY N/A 12/24/2022   Procedure: ESOPHAGOGASTRODUODENOSCOPY (EGD);  Surgeon: Shane Darling, MD;  Location: [facility] ENDOSCOPY;  Service: Endoscopy;  Laterality: N/A;   EXTRACORPOREAL SHOCK WAVE LITHOTRIPSY Left 01/12/2019   Procedure: EXTRACORPOREAL SHOCK WAVE LITHOTRIPSY (ESWL);  Surgeon: Lawerence Pressman, MD;  Location: [facility] ORS;  Service: Urology;  Laterality: Left;  UPPER GI ENDOSCOPY       Home Medications   Prior to Admission medications   Medication Sig Start Date End Date Taking? Authorizing Provider  acetaminophen  (TYLENOL ) 325 MG tablet Take 2 tablets (650 mg total) by mouth every 6 (six) hours as needed for mild pain (pain score 1-3), fever or headache (or Fever >/= 101). 04/12/24   Donaciano Frizzle, MD  albuterol   (VENTOLIN  HFA) 108 (90 Base) MCG/ACT inhaler Inhale 2 puffs into the lungs every 4 (four) hours as needed for wheezing or shortness of breath. 12/23/23   Arline Bennett, MD  allopurinol  (ZYLOPRIM ) 100 MG tablet Take 1 tablet (100 mg total) by mouth daily. 04/16/23 04/10/24  Normie Becton, FNP  colchicine  0.6 MG tablet Take 1 tablet (0.6 mg total) by mouth 2 (two) times daily. 06/01/23   Montey Apa, DO  diclofenac  Sodium (VOLTAREN ) 1 % GEL Apply 4 g topically 3 (three) times daily as needed (left chest wall pain). 09/23/23   Loman Risk, MD  ferrous sulfate  325 (65 FE) MG tablet Take 1 tablet (325 mg total) by mouth daily with breakfast. 06/02/23   Darus Engels A, DO  fluticasone -salmeterol (ADVAIR ) 100-50 MCG/ACT AEPB Inhale 1 puff into the lungs 2 (two) times daily. 04/20/23   Normie Becton, FNP  folic acid  (FOLVITE ) 1 MG tablet Take 1 tablet (1 mg total) by mouth daily. 06/01/23   Montey Apa, DO  lidocaine  (XYLOCAINE ) 5 % ointment Apply 1 Application topically as needed. Apply to left chest 04/12/24   Donaciano Frizzle, MD  lisinopril  (ZESTRIL ) 40 MG tablet TAKE ONE TABLET BY MOUTH ONCE EVERY DAY. 04/16/23   Normie Becton, FNP  metoprolol  succinate (TOPROL  XL) 25 MG 24 hr tablet Take 1 tablet (25 mg total) by mouth daily. 04/16/23   Normie Becton, FNP  Multiple Vitamin (MULTIVITAMIN WITH MINERALS) TABS tablet Take 1 tablet by mouth daily. 06/01/23   Montey Apa, DO  naltrexone  (DEPADE) 50 MG tablet Take 1 tablet (50 mg total) by mouth daily. 12/23/23   Arline Bennett, MD  oxyCODONE -acetaminophen  (PERCOCET) 5-325 MG tablet Take 1 tablet by mouth every 8 (eight) hours as needed for severe pain (pain score 7-10). 04/12/24 04/12/25  Donaciano Frizzle, MD  sertraline  (ZOLOFT ) 100 MG tablet Take 1 tablet (100 mg total) by mouth daily. 12/23/23 12/22/24  Arline Bennett, MD  sertraline  (ZOLOFT ) 50 MG tablet Take 1 tablet (50 mg total) by mouth daily. 04/16/23   Normie Becton, FNP  thiamine  (VITAMIN B-1) 100 MG  tablet Take 1 tablet (100 mg total) by mouth daily. 06/01/23   Darus Engels A, DO  traZODone  (DESYREL ) 100 MG tablet Take 1 tablet (100 mg total) by mouth at bedtime as needed. 04/13/24   Ostwalt, Janna, PA-C     Allergies  Patient has no known allergies.   Family History   Family History  Problem Relation Age of Onset   Alcohol abuse Father    Breast cancer Mother 51   Anxiety disorder Brother    Other Maternal Grandmother        unknown medical history   Other Maternal Grandfather        unknow medical history   Other Paternal Grandmother        unknown medical history   Other Paternal Grandfather        unknown medical history   Healthy Brother      Physical Exam  Triage Vital Signs: ED Triage Vitals  Encounter  Vitals Group     BP 04/16/24 0253 (!) 101/58     Systolic BP Percentile --      Diastolic BP Percentile --      Pulse Rate 04/16/24 0253 68     Resp 04/16/24 0253 18     Temp 04/16/24 0253 97.9 F (36.6 C)     Temp Source 04/16/24 0253 Oral     SpO2 04/16/24 0253 99 %     Weight --      Height --      Head Circumference --      Peak Flow --      Pain Score 04/16/24 0250 10     Pain Loc --      Pain Education --      Exclude from Growth Chart --     Updated Vital Signs: BP (!) 101/58   Pulse 68   Temp 97.9 F (36.6 C) (Oral)   Resp 18   SpO2 99%    General: Awake, no distress.  CV:  RRR.  Good peripheral perfusion.  Resp:  Normal effort.  CTAB.  Splinting.  No crepitus.  Point tender left lateral ribs.  No external evidence of injury. Abd:  Nontender to light or deep palpation.  No distention.  Other:  No truncal vesicles.   ED Results / Procedures / Treatments  Labs (all labs ordered are listed, but only abnormal results are displayed) Labs Reviewed - No data to display   EKG  None   RADIOLOGY I have independently visualized and interpreted patient's imaging study as well as noted the radiology interpretation:  Chest x-ray:  No pneumothorax, pulmonary hypoinflation, small left pleural effusion  Official radiology report(s): DG Chest 2 View Result Date: 04/16/2024 CLINICAL DATA:  Rib pain, multiple left rib fractures EXAM: CHEST - 2 VIEW COMPARISON:  None Available. FINDINGS: Lung volumes are small. Discoid atelectasis at the left lung base. Superimposed left basilar atelectasis or infiltrate is seen. Small left pleural effusion. No pneumothorax. No pleural effusion on the right. Cardiac size within normal limits. Pulmonary vascularity is normal. No acute bone abnormality is seen. Known rib fractures are not well appreciated on this examination. IMPRESSION: 1. Pulmonary hypoinflation. 2. Small left pleural effusion with associated left basilar atelectasis or infiltrate. Electronically Signed   By: Worthy Heads M.D.   On: 04/16/2024 03:21     PROCEDURES:  Critical Care performed: No  Procedures   MEDICATIONS ORDERED IN ED: Medications  LORazepam  (ATIVAN ) injection 2 mg (2 mg Intramuscular Given 04/16/24 0406)  oxyCODONE -acetaminophen  (PERCOCET/ROXICET) 5-325 MG per tablet 1 tablet (1 tablet Oral Given 04/16/24 0454)     IMPRESSION / MDM / ASSESSMENT AND PLAN / ED COURSE  I reviewed the triage vital signs and the nursing notes.                             63 year old male returns to the ED for left rib pain following a hospitalization for left-sided rib fractures.  Chest x-ray negative for pneumothorax.  Ativan  was given for patient tremulous with history of alcohol dependency.  Administer Percocet.  Patient will be discharged in the morning once he can secure a ride.  Patient's presentation is most consistent with acute, uncomplicated illness.    FINAL CLINICAL IMPRESSION(S) / ED DIAGNOSES   Final diagnoses:  None     Rx / DC Orders   ED Discharge Orders     None  Note:  This document was prepared using Dragon voice recognition software and may include unintentional dictation errors.    Mccall Will J, MD 04/16/24 [phone]

## 2024-04-16 NOTE — ED Notes (Signed)
 First nurse note: Pt presents via EMS from home c/o continued left sided rib pain. Pt recently admitted due to multiple rib fracture on the left side. VSS per EMS.

## 2024-04-16 NOTE — ED Notes (Signed)
 EDP notified of Pt CIWA score

## 2024-04-16 NOTE — ED Notes (Signed)
 Pt transferred to bed from recliner d/t risk of seizure, see CIWA score.

## 2024-04-16 NOTE — ED Triage Notes (Signed)
 Pt presents via EMS from home c/o continued left sided rib pain. Pt recently admitted due to multiple rib fracture on the left side. VSS per EMS.   Pt denies new fall. Reports pain to left side of ribs with movement/palpation.

## 2024-04-16 NOTE — Discharge Instructions (Signed)
 You may take pain medicine as needed.  Return to the ER for worsening symptoms, persistent vomiting, difficulty breathing, fever or other concerns.

## 2024-04-17 ENCOUNTER — Other Ambulatory Visit: Payer: Self-pay | Admitting: Family Medicine

## 2024-04-17 DIAGNOSIS — G4709 Other insomnia: Secondary | ICD-10-CM

## 2024-04-17 NOTE — Telephone Encounter (Signed)
 1st request was Sent to [facility]  [facility] for the following medications:   traZODone  (DESYREL ) 100 MG tablet    Please advise

## 2024-04-18 ENCOUNTER — Inpatient Hospital Stay: Payer: MEDICAID | Admitting: Family Medicine

## 2024-04-19 ENCOUNTER — Ambulatory Visit: Payer: MEDICAID | Admitting: Family Medicine

## 2024-04-21 ENCOUNTER — Other Ambulatory Visit: Payer: Self-pay

## 2024-04-21 ENCOUNTER — Emergency Department
Admission: EM | Admit: 2024-04-21 | Discharge: 2024-04-21 | Disposition: A | Discharge status: Home / Self Care | Payer: MEDICAID | Attending: Emergency Medicine | Admitting: Emergency Medicine

## 2024-04-21 ENCOUNTER — Encounter: Payer: Self-pay | Admitting: Emergency Medicine

## 2024-04-21 ENCOUNTER — Emergency Department: Payer: MEDICAID

## 2024-04-21 DIAGNOSIS — J45909 Unspecified asthma, uncomplicated: Secondary | ICD-10-CM | POA: Insufficient documentation

## 2024-04-21 DIAGNOSIS — Y908 Blood alcohol level of 240 mg/100 ml or more: Secondary | ICD-10-CM | POA: Insufficient documentation

## 2024-04-21 DIAGNOSIS — F10129 Alcohol abuse with intoxication, unspecified: Secondary | ICD-10-CM | POA: Diagnosis present

## 2024-04-21 DIAGNOSIS — Z79899 Other long term (current) drug therapy: Secondary | ICD-10-CM | POA: Insufficient documentation

## 2024-04-21 DIAGNOSIS — I10 Essential (primary) hypertension: Secondary | ICD-10-CM | POA: Diagnosis not present

## 2024-04-21 DIAGNOSIS — R296 Repeated falls: Secondary | ICD-10-CM | POA: Insufficient documentation

## 2024-04-21 DIAGNOSIS — F10920 Alcohol use, unspecified with intoxication, uncomplicated: Secondary | ICD-10-CM

## 2024-04-21 LAB — CBC WITH DIFFERENTIAL/PLATELET
Abs Immature Granulocytes: 0.04 10*3/uL (ref 0.00–0.07)
Basophils Absolute: 0 10*3/uL (ref 0.0–0.1)
Basophils Relative: 1 %
Eosinophils Absolute: 0.3 10*3/uL (ref 0.0–0.5)
Eosinophils Relative: 4 %
HCT: 43.3 % (ref 39.0–52.0)
Hemoglobin: 14 g/dL (ref 13.0–17.0)
Immature Granulocytes: 1 %
Lymphocytes Relative: 34 %
Lymphs Abs: 2.1 10*3/uL (ref 0.7–4.0)
MCH: 28.6 pg (ref 26.0–34.0)
MCHC: 32.3 g/dL (ref 30.0–36.0)
MCV: 88.5 fL (ref 80.0–100.0)
Monocytes Absolute: 0.6 10*3/uL (ref 0.1–1.0)
Monocytes Relative: 9 %
Neutro Abs: 3.2 10*3/uL (ref 1.7–7.7)
Neutrophils Relative %: 51 %
Platelets: 247 10*3/uL (ref 150–400)
RBC: 4.89 MIL/uL (ref 4.22–5.81)
RDW: 17.3 % — ABNORMAL HIGH (ref 11.5–15.5)
WBC: 6.3 10*3/uL (ref 4.0–10.5)
nRBC: 0 % (ref 0.0–0.2)

## 2024-04-21 LAB — COMPREHENSIVE METABOLIC PANEL WITH GFR
ALT: 15 U/L (ref 0–44)
AST: 17 U/L (ref 15–41)
Albumin: 4.2 g/dL (ref 3.5–5.0)
Alkaline Phosphatase: 68 U/L (ref 38–126)
Anion gap: 13 (ref 5–15)
BUN: 10 mg/dL (ref 8–23)
CO2: 24 mmol/L (ref 22–32)
Calcium: 9.5 mg/dL (ref 8.9–10.3)
Chloride: 107 mmol/L (ref 98–111)
Creatinine, Ser: 1.06 mg/dL (ref 0.61–1.24)
GFR, Estimated: >60 mL/min (ref >60)
Glucose, Bld: 121 mg/dL — ABNORMAL HIGH (ref 70–99)
Potassium: 3.6 mmol/L (ref 3.5–5.1)
Sodium: 144 mmol/L (ref 135–145)
Total Bilirubin: 0.6 mg/dL (ref 0.0–1.2)
Total Protein: 7.4 g/dL (ref 6.5–8.1)

## 2024-04-21 LAB — CK: Total CK: 28 U/L — ABNORMAL LOW (ref 49–397)

## 2024-04-21 LAB — ETHANOL: Alcohol, Ethyl (B): 371 mg/dL (ref <15)

## 2024-04-21 NOTE — ED Notes (Signed)
 Patient transported to CT

## 2024-04-21 NOTE — ED Provider Notes (Addendum)
 [facility] Provider Note    Event Date/Time   First MD Initiated Contact with Patient 04/21/24 [phone]     (approximate)   History   Fall   HPI  Miguel [name] is a 63 y.o. male with history of alcohol abuse, hypertension who is well-known to our emergency department with 11 visits in the past 6 months who presents to the emergency department EMS after he was found down at home.  EMS was transporting his wife back from the emergency department after her visit in the ED and found this patient on the floor of the doorway.  He is unable to tell us  how long he was on the floor.  He is unable to tell us  why he fell.  He does admit to drinking alcohol tonight.  He denies any acute pain.   History provided by patient, EMS.    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    OCD (obsessive compulsive disorder)    Renal colic     Past Surgical History:  Procedure Laterality Date   CHOLECYSTECTOMY  2012   COLONOSCOPY     COLONOSCOPY N/A 12/24/2022   Procedure: COLONOSCOPY;  Surgeon: Shane Darling, MD;  Location: [facility] ENDOSCOPY;  Service: Endoscopy;  Laterality: N/A;   COLONOSCOPY WITH PROPOFOL  N/A 05/28/2020   Procedure: COLONOSCOPY WITH PROPOFOL ;  Surgeon: Luke Salaam, MD;  Location: [facility] ENDOSCOPY;  Service: Gastroenterology;  Laterality: N/A;   ESOPHAGOGASTRODUODENOSCOPY N/A 12/24/2022   Procedure: ESOPHAGOGASTRODUODENOSCOPY (EGD);  Surgeon: Shane Darling, MD;  Location: [facility] ENDOSCOPY;  Service: Endoscopy;  Laterality: N/A;   EXTRACORPOREAL SHOCK WAVE LITHOTRIPSY Left 01/12/2019   Procedure: EXTRACORPOREAL SHOCK WAVE LITHOTRIPSY (ESWL);  Surgeon: Lawerence Pressman, MD;  Location: [facility] ORS;  Service: Urology;   Laterality: Left;   UPPER GI ENDOSCOPY      MEDICATIONS:  Prior to Admission medications   Medication Sig Start Date End Date Taking? Authorizing Provider  acetaminophen  (TYLENOL ) 325 MG tablet Take 2 tablets (650 mg total) by mouth every 6 (six) hours as needed for mild pain (pain score 1-3), fever or headache (or Fever >/= 101). 04/12/24   Donaciano Frizzle, MD  albuterol  (VENTOLIN  HFA) 108 (90 Base) MCG/ACT inhaler Inhale 2 puffs into the lungs every 4 (four) hours as needed for wheezing or shortness of breath. 12/23/23   Arline Bennett, MD  allopurinol  (ZYLOPRIM ) 100 MG tablet Take 1 tablet (100 mg total) by mouth daily. 04/16/23 04/10/24  Normie Becton, FNP  colchicine  0.6 MG tablet Take 1 tablet (0.6 mg total) by mouth 2 (two) times daily. 06/01/23   Montey Apa, DO  diclofenac  Sodium (VOLTAREN ) 1 % GEL Apply 4 g topically 3 (three) times daily as needed (left chest wall pain). 09/23/23   Loman Risk, MD  ferrous sulfate  325 (65 FE) MG tablet Take 1 tablet (325 mg total) by mouth daily with breakfast. 06/02/23   Darus Engels A, DO  fluticasone -salmeterol (ADVAIR ) 100-50 MCG/ACT AEPB Inhale 1 puff into the lungs 2 (two) times daily. 04/20/23   Normie Becton, FNP  folic acid  (FOLVITE ) 1 MG tablet Take 1 tablet (  1 mg total) by mouth daily. 06/01/23   Darus Engels A, DO  lidocaine  (XYLOCAINE ) 5 % ointment Apply 1 Application topically as needed. Apply to left chest 04/12/24   Donaciano Frizzle, MD  lisinopril  (ZESTRIL ) 40 MG tablet TAKE ONE TABLET BY MOUTH ONCE EVERY DAY. 04/16/23   Normie Becton, FNP  metoprolol  succinate (TOPROL  XL) 25 MG 24 hr tablet Take 1 tablet (25 mg total) by mouth daily. 04/16/23   Normie Becton, FNP  Multiple Vitamin (MULTIVITAMIN WITH MINERALS) TABS tablet Take 1 tablet by mouth daily. 06/01/23   Montey Apa, DO  naltrexone  (DEPADE) 50 MG tablet Take 1 tablet (50 mg total) by mouth daily. 12/23/23   Arline Bennett, MD  oxyCODONE -acetaminophen  (PERCOCET) 5-325 MG tablet  Take 1 tablet by mouth every 8 (eight) hours as needed for severe pain (pain score 7-10). 04/16/24 04/16/25  Sung, Jade J, MD  sertraline  (ZOLOFT ) 100 MG tablet Take 1 tablet (100 mg total) by mouth daily. 12/23/23 12/22/24  Arline Bennett, MD  sertraline  (ZOLOFT ) 50 MG tablet Take 1 tablet (50 mg total) by mouth daily. 04/16/23   Normie Becton, FNP  thiamine  (VITAMIN B-1) 100 MG tablet Take 1 tablet (100 mg total) by mouth daily. 06/01/23   Montey Apa, DO  traZODone  (DESYREL ) 100 MG tablet Take 1 tablet (100 mg total) by mouth at bedtime as needed. 04/13/24   Blane Bunting, PA-C    Physical Exam   Triage Vital Signs: ED Triage Vitals  Encounter Vitals Group     BP 04/21/24 0621 (!) 156/95     Systolic BP Percentile --      Diastolic BP Percentile --      Pulse Rate 04/21/24 0621 (!) 112     Resp 04/21/24 0621 20     Temp 04/21/24 0621 97.7 F (36.5 C)     Temp Source 04/21/24 0621 Rectal     SpO2 04/21/24 0621 100 %     Weight 04/21/24 0618 200 lb (90.7 kg)     Height 04/21/24 0618 6' (1.829 m)     Head Circumference --      Peak Flow --      Pain Score --      Pain Loc --      Pain Education --      Exclude from Growth Chart --     Most recent vital signs: Vitals:   04/21/24 0621 04/21/24 0653  BP: (!) 156/95 (!) 156/95  Pulse: (!) 112 (!) 112  Resp: 20   Temp: 97.7 F (36.5 C)   SpO2: 100%      CONSTITUTIONAL: Alert, responds appropriately to questions.  Tearful.  Intoxicated.  Chronically ill-appearing. HEAD: Normocephalic; atraumatic EYES: Conjunctivae clear, PERRL, EOMI ENT: normal nose; no rhinorrhea; moist mucous membranes; pharynx without lesions noted; no dental injury; no septal hematoma, no epistaxis; no facial deformity or bony tenderness NECK: Supple, no midline spinal tenderness, step-off or deformity; trachea midline CARD: Regular and tachycardic; S1 and S2 appreciated; no murmurs, no clicks, no rubs, no gallops RESP: Normal chest excursion without  splinting or tachypnea; breath sounds clear and equal bilaterally; no wheezes, no rhonchi, no rales; no hypoxia or respiratory distress CHEST:  chest wall stable, no crepitus or ecchymosis or deformity, nontender to palpation; no flail chest ABD/GI: Non-distended; soft, non-tender, no rebound, no guarding; no ecchymosis or other lesions noted PELVIS:  stable, nontender to palpation BACK:  The back appears normal; no midline spinal tenderness, step-off  or deformity EXT: Normal ROM in all joints; no edema; normal capillary refill; no cyanosis, no bony tenderness or bony deformity of patient's extremities, no joint effusions, compartments are soft, extremities are warm and well-perfused, no ecchymosis SKIN: Normal color for age and race; warm NEURO: No facial asymmetry, normal speech, moving all extremities equally  ED Results / Procedures / Treatments   LABS: (all labs ordered are listed, but only abnormal results are displayed) Labs Reviewed  CBC WITH DIFFERENTIAL/PLATELET - Abnormal; Notable for the following components:      Result Value   RDW 17.3 (*)    All other components within normal limits  COMPREHENSIVE METABOLIC PANEL WITH GFR - Abnormal; Notable for the following components:   Glucose, Bld 121 (*)    All other components within normal limits  ETHANOL - Abnormal; Notable for the following components:   Alcohol, Ethyl (B) 371 (*)    All other components within normal limits  CK - Abnormal; Notable for the following components:   Total CK 28 (*)    All other components within normal limits     EKG:  EKG Interpretation Date/Time:  Friday April 21 2024 06:19:03 EDT Ventricular Rate:  92 PR Interval:  184 QRS Duration:  94 QT Interval:  396 QTC Calculation: 489 R Axis:   -21  Text Interpretation: Normal sinus rhythm Low voltage QRS Inferior infarct , age undetermined Cannot rule out Anterior infarct (cited on or before 13-Jan-2024) Abnormal ECG When compared with ECG of  11-Apr-2024 18:00, Inferior infarct is now Present Confirmed by Verneda Golder [phone]) on 04/21/2024 6:48:05 AM          RADIOLOGY: My personal review and interpretation of imaging:  pending  I have personally reviewed all radiology reports. CT HEAD WO CONTRAST ( ) Result Date: 04/21/2024 CLINICAL DATA:  Unwitnessed fall EXAM: CT HEAD WITHOUT CONTRAST CT CERVICAL SPINE WITHOUT CONTRAST TECHNIQUE: Multidetector CT imaging of the head and cervical spine was performed following the standard protocol without intravenous contrast. Multiplanar CT image reconstructions of the cervical spine were also generated. RADIATION DOSE REDUCTION: This exam was performed according to the departmental dose-optimization program which includes automated exposure control, adjustment of the mA and/or kV according to patient size and/or use of iterative reconstruction technique. COMPARISON:  10/21/2023 FINDINGS: CT HEAD FINDINGS Brain: No evidence of acute infarction, hemorrhage, hydrocephalus, extra-axial collection or mass lesion/mass effect. Vascular: No hyperdense vessel or unexpected calcification. Skull: Normal. Negative for fracture or focal lesion. Sinuses/Orbits: No acute finding. Other: None. CT CERVICAL SPINE FINDINGS Alignment: Normal. Skull base and vertebrae: No acute fracture. No primary bone lesion or focal pathologic process. Soft tissues and spinal canal: No prevertebral fluid or swelling. No visible canal hematoma. Disc levels: Mild disc degenerative change of the lower cervical levels. Upper chest: Negative. Other: None. IMPRESSION: 1. No acute intracranial pathology. 2. No fracture or static subluxation of the cervical spine. 3. Mild disc degenerative change of the lower cervical levels. Electronically Signed   By: Fredricka Jenny M.D.   On: 04/21/2024 07:40   CT Cervical Spine Wo Contrast Result Date: 04/21/2024 CLINICAL DATA:  Unwitnessed fall EXAM: CT HEAD WITHOUT CONTRAST CT CERVICAL SPINE WITHOUT  CONTRAST TECHNIQUE: Multidetector CT imaging of the head and cervical spine was performed following the standard protocol without intravenous contrast. Multiplanar CT image reconstructions of the cervical spine were also generated. RADIATION DOSE REDUCTION: This exam was performed according to the departmental dose-optimization program which includes automated exposure control, adjustment of the mA  and/or kV according to patient size and/or use of iterative reconstruction technique. COMPARISON:  10/21/2023 FINDINGS: CT HEAD FINDINGS Brain: No evidence of acute infarction, hemorrhage, hydrocephalus, extra-axial collection or mass lesion/mass effect. Vascular: No hyperdense vessel or unexpected calcification. Skull: Normal. Negative for fracture or focal lesion. Sinuses/Orbits: No acute finding. Other: None. CT CERVICAL SPINE FINDINGS Alignment: Normal. Skull base and vertebrae: No acute fracture. No primary bone lesion or focal pathologic process. Soft tissues and spinal canal: No prevertebral fluid or swelling. No visible canal hematoma. Disc levels: Mild disc degenerative change of the lower cervical levels. Upper chest: Negative. Other: None. IMPRESSION: 1. No acute intracranial pathology. 2. No fracture or static subluxation of the cervical spine. 3. Mild disc degenerative change of the lower cervical levels. Electronically Signed   By: Fredricka Jenny M.D.   On: 04/21/2024 07:40     PROCEDURES:  Critical Care performed: No     Procedures    IMPRESSION / MDM / ASSESSMENT AND PLAN / ED COURSE  I reviewed the triage vital signs and the nursing notes.  Patient here with an unwitnessed fall.  History of alcohol use disorder.    DIFFERENTIAL DIAGNOSIS (includes but not limited to):   Alcohol intoxication, anemia, electrolyte derangement, dehydration, intracranial hemorrhage, cervical spine fracture, skull fracture  Patient's presentation is most consistent with acute presentation with potential  threat to life or bodily function.  PLAN: Will obtain CT of the head and cervical spine, labs.  EKG is nonischemic.  Will monitor until clinically sober.   MEDICATIONS GIVEN IN ED: Medications - No data to display   ED COURSE: Patient's hemoglobin is 14.  Normal electrolytes, LFTs.  Alcohol level of 371.  CK normal.  CT head and cervical spine showed no acute traumatic injury.  Will discharge home with safe ride.  At this time, I do not feel there is any life-threatening condition present. I reviewed all nursing notes, vitals, pertinent previous records.  All lab and urine results, EKGs, imaging ordered have been independently reviewed and interpreted by myself.  I reviewed all available radiology reports from any imaging ordered this visit.  Based on my assessment, I feel the patient is safe to be discharged home without further emergent workup and can continue workup as an outpatient as needed. Discussed all findings, treatment plan as well as usual and customary return precautions.  They verbalize understanding and are comfortable with this plan.  Outpatient follow-up has been provided as needed.  All questions have been answered.    CONSULTS: none   OUTSIDE RECORDS REVIEWED: Reviewed last admission on 04/11/2024 for a fall with multiple left-sided rib fractures.       FINAL CLINICAL IMPRESSION(S) / ED DIAGNOSES   Final diagnoses:  Alcoholic intoxication without complication (HCC)  Frequent falls     Rx / DC Orders   ED Discharge Orders     None        Note:  This document was prepared using Dragon voice recognition software and may include unintentional dictation errors.   Mila Pair, Clover Dao, DO 04/21/24 0981    Ausencio Vaden, Clover Dao, DO 04/21/24 1914

## 2024-04-21 NOTE — ED Triage Notes (Signed)
 BIB ems for unwitnessed fall, unsure how long pt was down.  [facility] was taking wife home and found pt on the floor in the doorway.  Pt unsure what happened or how long he was on the floor

## 2024-04-21 NOTE — ED Notes (Signed)
 Patient discharged to home per MD order. Patient in stable condition, and deemed medically cleared by ED provider for discharge. Discharge instructions reviewed with patient/family using "Teach Back"; verbalized understanding of medication education and administration, and information about follow-up care. Denies further concerns. Pt given taxi voucher, in wheelchair awaiting arrival.

## 2024-04-24 ENCOUNTER — Other Ambulatory Visit: Payer: Self-pay

## 2024-04-30 ENCOUNTER — Other Ambulatory Visit: Payer: Self-pay

## 2024-04-30 ENCOUNTER — Emergency Department: Payer: MEDICAID

## 2024-04-30 ENCOUNTER — Emergency Department
Admission: EM | Admit: 2024-04-30 | Discharge: 2024-04-30 | Disposition: A | Discharge status: Home / Self Care | Payer: MEDICAID | Attending: Emergency Medicine | Admitting: Emergency Medicine

## 2024-04-30 DIAGNOSIS — S2242XA Multiple fractures of ribs, left side, initial encounter for closed fracture: Secondary | ICD-10-CM | POA: Diagnosis not present

## 2024-04-30 DIAGNOSIS — X58XXXA Exposure to other specified factors, initial encounter: Secondary | ICD-10-CM | POA: Diagnosis not present

## 2024-04-30 DIAGNOSIS — F10129 Alcohol abuse with intoxication, unspecified: Secondary | ICD-10-CM | POA: Diagnosis not present

## 2024-04-30 DIAGNOSIS — S299XXA Unspecified injury of thorax, initial encounter: Secondary | ICD-10-CM | POA: Diagnosis present

## 2024-04-30 DIAGNOSIS — F10920 Alcohol use, unspecified with intoxication, uncomplicated: Secondary | ICD-10-CM

## 2024-04-30 LAB — TROPONIN I (HIGH SENSITIVITY): Troponin I (High Sensitivity): 4 ng/L (ref <18)

## 2024-04-30 LAB — CBC WITH DIFFERENTIAL/PLATELET
Abs Immature Granulocytes: 0.03 10*3/uL (ref 0.00–0.07)
Basophils Absolute: 0.1 10*3/uL (ref 0.0–0.1)
Basophils Relative: 1 %
Eosinophils Absolute: 0.2 10*3/uL (ref 0.0–0.5)
Eosinophils Relative: 3 %
HCT: 41.9 % (ref 39.0–52.0)
Hemoglobin: 13.7 g/dL (ref 13.0–17.0)
Immature Granulocytes: 0 %
Lymphocytes Relative: 22 %
Lymphs Abs: 1.8 10*3/uL (ref 0.7–4.0)
MCH: 28.8 pg (ref 26.0–34.0)
MCHC: 32.7 g/dL (ref 30.0–36.0)
MCV: 88 fL (ref 80.0–100.0)
Monocytes Absolute: 0.8 10*3/uL (ref 0.1–1.0)
Monocytes Relative: 10 %
Neutro Abs: 5.4 10*3/uL (ref 1.7–7.7)
Neutrophils Relative %: 64 %
Platelets: 211 10*3/uL (ref 150–400)
RBC: 4.76 MIL/uL (ref 4.22–5.81)
RDW: 17.8 % — ABNORMAL HIGH (ref 11.5–15.5)
WBC: 8.4 10*3/uL (ref 4.0–10.5)
nRBC: 0 % (ref 0.0–0.2)

## 2024-04-30 LAB — COMPREHENSIVE METABOLIC PANEL WITH GFR
ALT: 16 U/L (ref 0–44)
AST: 21 U/L (ref 15–41)
Albumin: 3.9 g/dL (ref 3.5–5.0)
Alkaline Phosphatase: 69 U/L (ref 38–126)
Anion gap: 13 (ref 5–15)
BUN: 20 mg/dL (ref 8–23)
CO2: 25 mmol/L (ref 22–32)
Calcium: 8.4 mg/dL — ABNORMAL LOW (ref 8.9–10.3)
Chloride: 103 mmol/L (ref 98–111)
Creatinine, Ser: 1.02 mg/dL (ref 0.61–1.24)
GFR, Estimated: >60 mL/min (ref >60)
Glucose, Bld: 92 mg/dL (ref 70–99)
Potassium: 4.6 mmol/L (ref 3.5–5.1)
Sodium: 141 mmol/L (ref 135–145)
Total Bilirubin: 0.9 mg/dL (ref 0.0–1.2)
Total Protein: 6.9 g/dL (ref 6.5–8.1)

## 2024-04-30 MED ORDER — LIDOCAINE 5 % EX PTCH
1.0000 | MEDICATED_PATCH | CUTANEOUS | Status: DC
  Administered 2024-04-30: 1 via TRANSDERMAL
  Filled 2024-04-30: qty 1

## 2024-04-30 MED ORDER — OXYCODONE-ACETAMINOPHEN 5-325 MG PO TABS
1.0000 | ORAL_TABLET | Freq: Once | ORAL | Status: AC
  Administered 2024-04-30: 1 via ORAL
  Filled 2024-04-30: qty 1

## 2024-04-30 MED ORDER — IBUPROFEN 200 MG PO TABS
600.0000 mg | ORAL_TABLET | Freq: Three times a day (TID) | ORAL | 2 refills | Status: AC | PRN
  Filled 2024-04-30 – 2024-05-01 (×2): qty 50, 6d supply, fill #0

## 2024-04-30 MED ORDER — KETOROLAC TROMETHAMINE 60 MG/2ML IM SOLN: 60.0000 mg | Freq: Once | INTRAMUSCULAR | Status: DC

## 2024-04-30 MED ORDER — KETOROLAC TROMETHAMINE 15 MG/ML IJ SOLN
15.0000 mg | Freq: Once | INTRAMUSCULAR | Status: AC
  Administered 2024-04-30: 15 mg via INTRAVENOUS
  Filled 2024-04-30: qty 1

## 2024-04-30 MED ORDER — OXYCODONE-ACETAMINOPHEN 5-325 MG PO TABS
1.0000 | ORAL_TABLET | ORAL | 0 refills | Status: DC | PRN
  Filled 2024-04-30: qty 8, 2d supply, fill #0

## 2024-04-30 MED ORDER — ACETAMINOPHEN 500 MG PO TABS
1000.0000 mg | ORAL_TABLET | Freq: Three times a day (TID) | ORAL | 2 refills | Status: AC | PRN
  Filled 2024-04-30: qty 100, 17d supply, fill #0

## 2024-04-30 MED ORDER — LIDOCAINE 5 % EX PTCH
1.0000 | MEDICATED_PATCH | CUTANEOUS | 0 refills | Status: DC
  Filled 2024-04-30: qty 10, 10d supply, fill #0

## 2024-04-30 MED ORDER — ACETAMINOPHEN 500 MG PO TABS
500.0000 mg | ORAL_TABLET | Freq: Once | ORAL | Status: AC
  Administered 2024-04-30: 500 mg via ORAL
  Filled 2024-04-30: qty 1

## 2024-04-30 NOTE — ED Provider Notes (Signed)
 [facility] Provider Note   Event Date/Time   First MD Initiated Contact with Patient 04/30/24 [phone]     (approximate) History  Rib Injury and Alcohol Intoxication  HPI Miguel [name] is a 63 y.o. male with a history of severe alcohol abuse well-known to this department presents via EMS for left rib and flank pain after a fall a few weeks ago and stated rib fractures.  Patient states that he has been drinking tonight to try to control his pain but it has not improved. ROS: Patient currently denies any vision changes, tinnitus, difficulty speaking, facial droop, sore throat, chest pain, shortness of breath, abdominal pain, nausea/vomiting/diarrhea, dysuria, or weakness/numbness/paresthesias in any extremity   Physical Exam  Triage Vital Signs: ED Triage Vitals  Encounter Vitals Group     BP      Girls Systolic BP Percentile      Girls Diastolic BP Percentile      Boys Systolic BP Percentile      Boys Diastolic BP Percentile      Pulse      Resp      Temp      Temp src      SpO2      Weight      Height      Head Circumference      Peak Flow      Pain Score      Pain Loc      Pain Education      Exclude from Growth Chart    Most recent vital signs: Vitals:   04/30/24 0513 04/30/24 0530  BP:  (!) 150/106  Pulse: 97 89  Resp: 20 16  Temp: (!) 97.1 F (36.2 C)   SpO2: 99% 100%   General: Awake, cooperative CV:  Good peripheral perfusion. Resp:  Normal effort.  CTAB Abd:  No distention. Other:  Middle-aged obese Caucasian male resting in stretcher in mild distress secondary to pain ED Results / Procedures / Treatments  Labs (all labs ordered are listed, but only abnormal results are displayed) Labs Reviewed  COMPREHENSIVE METABOLIC PANEL WITH GFR - Abnormal; Notable for the following components:      Result Value   Calcium  8.4 (*)    All other components within normal limits  CBC WITH DIFFERENTIAL/PLATELET - Abnormal; Notable for the following  components:   RDW 17.8 (*)    All other components within normal limits  TROPONIN I (HIGH SENSITIVITY)   EKG ED ECG REPORT I, Charleen Conn, the attending physician, personally viewed and interpreted this ECG. Date: 04/30/2024 EKG Time: 0527 Rate: 86 Rhythm: normal sinus rhythm QRS Axis: normal Intervals: normal ST/T Wave abnormalities: normal Narrative Interpretation: no evidence of acute ischemia RADIOLOGY ED MD interpretation: Single view portable chest x-ray shows slightly displaced fractures of the lateral left 5th through 7th ribs - All radiology independently interpreted and agree with radiology assessment Official radiology report(s): DG Chest Port 1 View Result Date: 04/30/2024 CLINICAL DATA:  Chest pain with shortness of breath. EXAM: PORTABLE CHEST 1 VIEW COMPARISON:  AP Lat chest 04/16/2024. FINDINGS: Slightly displaced fractures of the lateral left fifth through seventh ribs are again noted. There is no measurable pneumothorax or effusion. Linear platelike atelectasis in the lateral left base is noted but the lungs are otherwise generally clear allowing for underinflation. Heart size vasculature and the mediastinal configuration are normal. There is calcification of the transverse aorta. Thoracic spondylosis. IMPRESSION: 1. Slightly displaced fractures of the  lateral left fifth through seventh ribs, seen previously and first seen on rib series 04/11/2024. No measurable pneumothorax or effusion. 2. Underinflation with linear platelike atelectasis in the lateral left base. 3. Aortic atherosclerosis. Electronically Signed   By: Denman Fischer M.D.   On: 04/30/2024 06:05   PROCEDURES: Critical Care performed: No Procedures MEDICATIONS ORDERED IN ED: Medications  ketorolac  (TORADOL ) injection 60 mg (has no administration in time range)  oxyCODONE -acetaminophen  (PERCOCET/ROXICET) 5-325 MG per tablet 1 tablet (has no administration in time range)   IMPRESSION / MDM / ASSESSMENT  AND PLAN / ED COURSE  I reviewed the triage vital signs and the nursing notes.                             The patient is on the cardiac monitor to evaluate for evidence of arrhythmia and/or significant heart rate changes. Patient's presentation is most consistent with acute presentation with potential threat to life or bodily function. Patient is a 63 year old male who presents after trauma to the chest wall complaining of left-sided pain  Differential diagnosis includes but is not limited to: Rib fracture, pulmonary contusion, cardiac contusion, pleural effusion, pneumothorax, hemothorax, spinal fracture, musculoskeletal injury  Imaging: Chest x-ray  Patient shows uncomplicated left 5th through 7th rib fracture.  Pain is well-controlled and patient is drawing appropriate volumes on incentive spirometer.  Patient is stable for discharge with continued analgesia at home as well as instructions to continue this incentive spirometer.  Patient also informed that they will need follow-up with her primary care physician within the next 3-5 days for reexamination and possible refill on analgesic prescriptions  Dispo: Discharge home with PCP follow-up Clinical Course as of 04/30/24 0703  Sun Apr 30, 2024  0701 S/o from Dr. Alejo Amsler 57M hx AUD Recent admit for rib fx, redemonstrated today, no new injuries  Sobering, stable for DC once clinically sober   [MM]    Clinical Course User Index [MM] Collis Deaner, MD   FINAL CLINICAL IMPRESSION(S) / ED DIAGNOSES   Final diagnoses:  Closed fracture of multiple ribs of left side, initial encounter   Rx / DC Orders   ED Discharge Orders          Ordered    oxyCODONE -acetaminophen  (PERCOCET) 5-325 MG tablet  Every 4 hours PRN        04/30/24 0618           Note:  This document was prepared using Dragon voice recognition software and may include unintentional dictation errors.

## 2024-04-30 NOTE — ED Provider Notes (Signed)
 Signout from Dr. Vana Gelineau.  See updated ED course below. Physical Exam  BP (!) 163/88   Pulse 98   Temp (!) 97.1 F (36.2 C) (Oral)   Resp (!) 24   SpO2 100%   Physical Exam  Procedures  Procedures  ED Course / MDM   Clinical Course as of 04/30/24 0911  Sun Apr 30, 2024  0701 S/o from Dr. Alejo Amsler 22M hx AUD Recent admit for rib fx, redemonstrated today, no new injuries  Sobering, stable for DC once clinically sober   [MM]  0906 Reevaluated, still complaining of some left-sided chest wall pain.  Discussed reassuring workup today, redemonstration of known fractures, no underlying pneumothorax.  Previously treated with 15 mg Toradol  as well as oxycodone -acetaminophen .  Will give further small dose of acetaminophen  as well as another shot of Toradol  and Lidoderm  patch and plan for discharge home.  Encouraged to follow-up with a primary care provider given very frequent ED visits, patient amenable to plan.  Strict ED return precautions in place.  Will also provide patient incentive spirometer. [MM]    Clinical Course User Index [MM] Collis Deaner, MD   Medical Decision Making Amount and/or Complexity of Data Reviewed Labs: ordered. Radiology: ordered.  Risk OTC drugs. Prescription drug management.          Collis Deaner, MD 04/30/24 [phone]

## 2024-04-30 NOTE — ED Notes (Signed)
Pt given taxi voucher for ride home

## 2024-04-30 NOTE — Discharge Instructions (Addendum)
 Your evaluation in the emergency department was reassuring.  We revisualize to your known rib fractures, and I have prescribed you some medications to help with any ongoing discomfort.  Please do follow-up closely with a primary care provider for ongoing management and reevaluation.  Return to the emergency department with any new or worsening symptoms.

## 2024-04-30 NOTE — ED Triage Notes (Signed)
 BIB EMS for left rib/flank pain after a fall a couple weeks ago. Possible ETOH on board.

## 2024-05-01 ENCOUNTER — Other Ambulatory Visit: Payer: Self-pay

## 2024-05-09 ENCOUNTER — Other Ambulatory Visit: Payer: Self-pay

## 2024-05-09 ENCOUNTER — Emergency Department
Admission: EM | Admit: 2024-05-09 | Discharge: 2024-05-10 | Disposition: A | Discharge status: Other Acute Inpatient Hospital | Payer: MEDICAID | Attending: Emergency Medicine | Admitting: Emergency Medicine

## 2024-05-09 ENCOUNTER — Encounter: Payer: Self-pay | Admitting: Emergency Medicine

## 2024-05-09 DIAGNOSIS — I10 Essential (primary) hypertension: Secondary | ICD-10-CM | POA: Insufficient documentation

## 2024-05-09 DIAGNOSIS — R45851 Suicidal ideations: Secondary | ICD-10-CM | POA: Diagnosis not present

## 2024-05-09 DIAGNOSIS — J45909 Unspecified asthma, uncomplicated: Secondary | ICD-10-CM | POA: Insufficient documentation

## 2024-05-09 DIAGNOSIS — F10929 Alcohol use, unspecified with intoxication, unspecified: Secondary | ICD-10-CM

## 2024-05-09 DIAGNOSIS — Y908 Blood alcohol level of 240 mg/100 ml or more: Secondary | ICD-10-CM | POA: Diagnosis not present

## 2024-05-09 DIAGNOSIS — Z87891 Personal history of nicotine dependence: Secondary | ICD-10-CM | POA: Insufficient documentation

## 2024-05-09 DIAGNOSIS — Z87442 Personal history of urinary calculi: Secondary | ICD-10-CM | POA: Diagnosis not present

## 2024-05-09 DIAGNOSIS — Z79899 Other long term (current) drug therapy: Secondary | ICD-10-CM | POA: Insufficient documentation

## 2024-05-09 DIAGNOSIS — F4321 Adjustment disorder with depressed mood: Secondary | ICD-10-CM | POA: Diagnosis not present

## 2024-05-09 DIAGNOSIS — R4588 Nonsuicidal self-harm: Secondary | ICD-10-CM | POA: Diagnosis not present

## 2024-05-09 DIAGNOSIS — F1090 Alcohol use, unspecified, uncomplicated: Secondary | ICD-10-CM

## 2024-05-09 DIAGNOSIS — F10129 Alcohol abuse with intoxication, unspecified: Secondary | ICD-10-CM | POA: Insufficient documentation

## 2024-05-09 LAB — COMPREHENSIVE METABOLIC PANEL WITH GFR
ALT: 14 U/L (ref 0–44)
AST: 19 U/L (ref 15–41)
Albumin: 3.9 g/dL (ref 3.5–5.0)
Alkaline Phosphatase: 65 U/L (ref 38–126)
Anion gap: 9 (ref 5–15)
BUN: 15 mg/dL (ref 8–23)
CO2: 26 mmol/L (ref 22–32)
Calcium: 9 mg/dL (ref 8.9–10.3)
Chloride: 107 mmol/L (ref 98–111)
Creatinine, Ser: 0.96 mg/dL (ref 0.61–1.24)
GFR, Estimated: >60 mL/min (ref >60)
Glucose, Bld: 95 mg/dL (ref 70–99)
Potassium: 4.3 mmol/L (ref 3.5–5.1)
Sodium: 142 mmol/L (ref 135–145)
Total Bilirubin: 0.8 mg/dL (ref 0.0–1.2)
Total Protein: 7.3 g/dL (ref 6.5–8.1)

## 2024-05-09 LAB — CBC
HCT: 41.9 % (ref 39.0–52.0)
Hemoglobin: 13.5 g/dL (ref 13.0–17.0)
MCH: 28.9 pg (ref 26.0–34.0)
MCHC: 32.2 g/dL (ref 30.0–36.0)
MCV: 89.7 fL (ref 80.0–100.0)
Platelets: 180 10*3/uL (ref 150–400)
RBC: 4.67 MIL/uL (ref 4.22–5.81)
RDW: 17.6 % — ABNORMAL HIGH (ref 11.5–15.5)
WBC: 9.8 10*3/uL (ref 4.0–10.5)
nRBC: 0 % (ref 0.0–0.2)

## 2024-05-09 LAB — ETHANOL: Alcohol, Ethyl (B): 366 mg/dL (ref <15)

## 2024-05-09 LAB — SALICYLATE LEVEL: Salicylate Lvl: <7 mg/dL — ABNORMAL LOW (ref 7.0–30.0)

## 2024-05-09 LAB — ACETAMINOPHEN LEVEL: Acetaminophen (Tylenol), Serum: <10 ug/mL — ABNORMAL LOW (ref 10–30)

## 2024-05-09 MED ORDER — HALOPERIDOL LACTATE 5 MG/ML IJ SOLN
5.0000 mg | Freq: Once | INTRAMUSCULAR | Status: AC
  Administered 2024-05-09: 5 mg via INTRAMUSCULAR
  Filled 2024-05-09: qty 1

## 2024-05-09 MED ORDER — LORAZEPAM 2 MG/ML IJ SOLN
2.0000 mg | Freq: Once | INTRAMUSCULAR | Status: AC
  Administered 2024-05-09: 2 mg via INTRAMUSCULAR
  Filled 2024-05-09: qty 1

## 2024-05-09 MED ORDER — TETANUS-DIPHTH-ACELL PERTUSSIS 5-2.5-18.5 LF-MCG/0.5 IM SUSY: 0.5000 mL | PREFILLED_SYRINGE | Freq: Once | INTRAMUSCULAR | Status: DC

## 2024-05-09 NOTE — ED Provider Notes (Signed)
 BP (!) 159/98   Pulse 90   Temp 98 F (36.7 C) (Oral)   Resp 20   Ht 6' (1.829 m)   Wt 90.7 kg   SpO2 100%   BMI 27.12 kg/m   Patient is here on IVC for self-harm behavior in the setting of alcohol use.  Became very agitated here in emergency department around 7:45 PM.  Unable to verbally DC de-escalate on multiple attempts.  Wandering the hallway.  Started slamming doors and then hitting it hard with the palm of his hand.  Also threatening staff.  Will require sedation for patient and staff safety.  5 mg IM Haldol  ordered.   Clarine Ozell LABOR, MD 05/09/24 [phone]

## 2024-05-09 NOTE — ED Notes (Signed)
 Patient refused tetanus shot as well, will attempt to offer again when patient is more calm.

## 2024-05-09 NOTE — BH Assessment (Signed)
 This Clinical research associate contacted [facility] via phone to request an assessment for this patient, request has been made, assessment is currently pending.

## 2024-05-09 NOTE — ED Triage Notes (Signed)
 Patient brought in via [facility] with c/o self-inflicted superficial lacerations to abdomen. Patient noted to have multiple superficial lacerations to abdomen- no bleeding noted on assessment. Patient reported he did this to himself because he was financially struggling and only has 5 dollars to his name. Patient admitted to ETOH use- appears intoxicated upon arrival. Patient non-compliant with dressing out and becoming verbally aggressive with staff and police officer. Patient using racial slurs towards staff and threatening to fight staff members. ED MD notified and orders placed. IM ativan  given as ordered by this RN- patient took injection voluntarily, no manual hold required. Post-administration, patient continues to say inappropriate comments to staff. Security, allied, and Research officer, trade union remain at patient door for safety of staff and other patients.

## 2024-05-09 NOTE — ED Notes (Signed)
 Pt offered snack. Pt refused and requested only water. Pt calm and cooperative at this time.

## 2024-05-09 NOTE — ED Notes (Signed)
 Patient continues to refuse to dress out in behavioral clothing (per protocol).

## 2024-05-09 NOTE — ED Provider Notes (Addendum)
   [facility] Provider Note    Event Date/Time   First MD Initiated Contact with Patient 05/09/24 1407     (approximate)  History   Chief Complaint: Suicidal ideation, alcohol intoxication  HPI  Miguel [name] is a 63 y.o. male with a past medical history of chronic alcohol abuse, gastric reflux, hypertension, presents to the emergency department for alcohol intoxication with self-mutilation/suicidal ideation.  According to EMS patient reported to them that he has had worsening depression feels responsible for his family's financial situation admits to alcohol intoxication and took a razor blade made several superficial cuts to his chest/abdomen.  Here patient has slurred speech consistent with alcohol intoxication.  Thinks his last tetanus shot was 7 or 8 years ago but is not sure.  Has no other complaints at this time.  Physical Exam   Triage Vital Signs: ED Triage Vitals  Encounter Vitals Group     BP      Girls Systolic BP Percentile      Girls Diastolic BP Percentile      Boys Systolic BP Percentile      Boys Diastolic BP Percentile      Pulse      Resp      Temp      Temp src      SpO2      Weight      Height      Head Circumference      Peak Flow      Pain Score      Pain Loc      Pain Education      Exclude from Growth Chart     Most recent vital signs: There were no vitals filed for this visit.  General: Awake, no distress.  CV:  Good peripheral perfusion.  Regular rate and rhythm  Resp:  Normal effort.  Equal breath sounds bilaterally.  Abd:  No distention.  Soft,  Other:  Patient has 5 superficial cuts/scrapes to the chest/abdomen.  No bleeding.   ED Results / Procedures / Treatments   MEDICATIONS ORDERED IN ED: Medications - No data to display   IMPRESSION / MDM / ASSESSMENT AND PLAN / ED COURSE  I reviewed the triage vital signs and the nursing notes.  Patient's presentation is most consistent with acute presentation with  potential threat to life or bodily function.  Patient presents emergency department for worsening depression and suicidal ideation and self-mutilation with alcohol intoxication.  Will check labs we will place the patient under an IVC until psychiatry can adequately evaluate we will continue to monitor in the emergency department.  He is not entirely sure when his last tetanus shot was given a razor blade cut to the abdomen/chest we will update the patient's tetanus while awaiting results.  ----------------------------------------- 2:25 PM on 05/09/2024 ----------------------------------------- Patient is now agitated, punching the walls.  Will dose 2 mg of IM Ativan  and reassess.  FINAL CLINICAL IMPRESSION(S) / ED DIAGNOSES   Alcohol intoxication Suicidal ideation Depression  Note:  This document was prepared using Dragon voice recognition software and may include unintentional dictation errors.   Dorothyann Drivers, MD 05/09/24 1420    Dorothyann Drivers, MD 05/09/24 1425

## 2024-05-09 NOTE — ED Notes (Signed)
 Pt belongings placed in single belongings bag. Pt will not take off underwear. Belongings include: shirt, shorts, black shoes, towel.

## 2024-05-09 NOTE — BH Assessment (Signed)
 Patient arrived agitated, under the influence BAL 366 and has been medicated due to aggressive behaviors. Will hold on requesting IRIS for this patient at this time. TTS to assess when patient has had time to calm down and sober up before attempting.

## 2024-05-09 NOTE — ED Notes (Signed)
 6/24- IVC with all papers on chart/awaiting Tts/Psych consult

## 2024-05-09 NOTE — ED Notes (Signed)
 Pt angry in hallway. Aggressive behaviors escalating. MD, RN, EDT, charge RN, and security at pts side attempting to deescalate.

## 2024-05-09 NOTE — ED Notes (Signed)
 MD Paduchowski aware of ETOH level of 366

## 2024-05-09 NOTE — ED Notes (Signed)
 Patient refused to let staff implement high fall risk precautions including yellow bracelet, yellow socks, etc.

## 2024-05-10 ENCOUNTER — Other Ambulatory Visit (HOSPITAL_COMMUNITY)
Admission: EM | Admit: 2024-05-10 | Discharge: 2024-05-11 | Disposition: A | Discharge status: Home / Self Care | Payer: MEDICAID | Attending: Psychiatry | Admitting: Psychiatry

## 2024-05-10 DIAGNOSIS — F1020 Alcohol dependence, uncomplicated: Secondary | ICD-10-CM | POA: Insufficient documentation

## 2024-05-10 DIAGNOSIS — F4321 Adjustment disorder with depressed mood: Secondary | ICD-10-CM

## 2024-05-10 DIAGNOSIS — F1994 Other psychoactive substance use, unspecified with psychoactive substance-induced mood disorder: Secondary | ICD-10-CM | POA: Diagnosis not present

## 2024-05-10 DIAGNOSIS — F1090 Alcohol use, unspecified, uncomplicated: Secondary | ICD-10-CM

## 2024-05-10 DIAGNOSIS — F331 Major depressive disorder, recurrent, moderate: Secondary | ICD-10-CM | POA: Insufficient documentation

## 2024-05-10 DIAGNOSIS — R4588 Nonsuicidal self-harm: Secondary | ICD-10-CM

## 2024-05-10 MED ORDER — ADULT MULTIVITAMIN W/MINERALS CH
1.0000 | ORAL_TABLET | Freq: Every day | ORAL | Status: DC
  Administered 2024-05-10 – 2024-05-11 (×2): 1 via ORAL
  Filled 2024-05-10 (×2): qty 1

## 2024-05-10 MED ORDER — MAGNESIUM HYDROXIDE 400 MG/5ML PO SUSP: 30.0000 mL | Freq: Every day | ORAL | Status: DC | PRN

## 2024-05-10 MED ORDER — DICLOFENAC SODIUM 1 % EX GEL: 4.0000 g | Freq: Three times a day (TID) | CUTANEOUS | Status: DC | PRN

## 2024-05-10 MED ORDER — THIAMINE MONONITRATE 100 MG PO TABS
100.0000 mg | ORAL_TABLET | Freq: Every day | ORAL | Status: DC
  Administered 2024-05-11: 100 mg via ORAL
  Filled 2024-05-10: qty 1

## 2024-05-10 MED ORDER — LISINOPRIL 20 MG PO TABS
40.0000 mg | ORAL_TABLET | Freq: Every day | ORAL | Status: DC
  Administered 2024-05-10 – 2024-05-11 (×2): 40 mg via ORAL
  Filled 2024-05-10 (×2): qty 2

## 2024-05-10 MED ORDER — LORAZEPAM 2 MG PO TABS: 0.0000 mg | ORAL_TABLET | Freq: Two times a day (BID) | ORAL | Status: DC

## 2024-05-10 MED ORDER — LORAZEPAM 2 MG/ML IJ SOLN: 0.0000 mg | Freq: Two times a day (BID) | INTRAMUSCULAR | Status: DC

## 2024-05-10 MED ORDER — LORAZEPAM 2 MG PO TABS
0.0000 mg | ORAL_TABLET | Freq: Four times a day (QID) | ORAL | Status: DC
  Administered 2024-05-10: 2 mg via ORAL
  Filled 2024-05-10: qty 1

## 2024-05-10 MED ORDER — THIAMINE MONONITRATE 100 MG PO TABS
100.0000 mg | ORAL_TABLET | Freq: Every day | ORAL | Status: DC
  Administered 2024-05-10: 100 mg via ORAL
  Filled 2024-05-10: qty 1

## 2024-05-10 MED ORDER — ALUM & MAG HYDROXIDE-SIMETH 200-200-20 MG/5ML PO SUSP: 30.0000 mL | ORAL | Status: DC | PRN

## 2024-05-10 MED ORDER — TRAZODONE HCL 100 MG PO TABS
100.0000 mg | ORAL_TABLET | Freq: Every evening | ORAL | Status: DC | PRN
Start: 2024-05-10 — End: 2024-05-11
  Administered 2024-05-10: 100 mg via ORAL
  Filled 2024-05-10: qty 1

## 2024-05-10 MED ORDER — ACETAMINOPHEN 500 MG PO TABS: 500.0000 mg | ORAL_TABLET | Freq: Four times a day (QID) | ORAL | Status: DC | PRN

## 2024-05-10 MED ORDER — THIAMINE HCL 100 MG/ML IJ SOLN
100.0000 mg | Freq: Once | INTRAMUSCULAR | Status: AC
  Administered 2024-05-10: 100 mg via INTRAMUSCULAR
  Filled 2024-05-10: qty 2

## 2024-05-10 MED ORDER — THIAMINE HCL 100 MG/ML IJ SOLN: 100.0000 mg | Freq: Every day | INTRAMUSCULAR | Status: DC

## 2024-05-10 MED ORDER — LOPERAMIDE HCL 2 MG PO CAPS: 2.0000 mg | ORAL_CAPSULE | ORAL | Status: DC | PRN

## 2024-05-10 MED ORDER — LORAZEPAM 2 MG/ML IJ SOLN: 0.0000 mg | Freq: Four times a day (QID) | INTRAMUSCULAR | Status: DC

## 2024-05-10 MED ORDER — OLANZAPINE 5 MG PO TBDP: 5.0000 mg | ORAL_TABLET | Freq: Three times a day (TID) | ORAL | Status: DC | PRN

## 2024-05-10 MED ORDER — LORAZEPAM 1 MG PO TABS
1.0000 mg | ORAL_TABLET | Freq: Four times a day (QID) | ORAL | Status: DC | PRN
  Administered 2024-05-10 – 2024-05-11 (×2): 1 mg via ORAL
  Filled 2024-05-10 (×2): qty 1

## 2024-05-10 MED ORDER — ACETAMINOPHEN 500 MG PO TABS
1000.0000 mg | ORAL_TABLET | Freq: Once | ORAL | Status: AC
  Administered 2024-05-10: 1000 mg via ORAL
  Filled 2024-05-10: qty 2

## 2024-05-10 MED ORDER — ONDANSETRON 4 MG PO TBDP
4.0000 mg | ORAL_TABLET | Freq: Four times a day (QID) | ORAL | Status: DC | PRN
  Administered 2024-05-10: 4 mg via ORAL
  Filled 2024-05-10: qty 1

## 2024-05-10 MED ORDER — OLANZAPINE 10 MG IM SOLR: 5.0000 mg | Freq: Three times a day (TID) | INTRAMUSCULAR | Status: DC | PRN

## 2024-05-10 MED ORDER — HYDROXYZINE HCL 25 MG PO TABS
25.0000 mg | ORAL_TABLET | Freq: Three times a day (TID) | ORAL | Status: DC | PRN
  Administered 2024-05-10 (×2): 25 mg via ORAL
  Filled 2024-05-10 (×2): qty 1

## 2024-05-10 NOTE — ED Notes (Signed)
 EMTALA Reviewed by this RN.

## 2024-05-10 NOTE — ED Notes (Signed)
 [facility]  [facility]  DEPT  CALLED FOR  TRANSPORT  TO  [facility]

## 2024-05-10 NOTE — Group Note (Signed)
 Group Topic: Social Support  Group Date: 05/10/2024 Start Time: 1605 End Time: 1650 Facilitators: Celinda Suzen NOVAK, NT  Department: [facility]  Number of Participants: 5  Group Focus: relapse prevention and social skills Treatment Modality:  Psychoeducation Interventions utilized were support Purpose: increase insight and regain self-worth  Name: Miguel [name] Date of Birth: May 21, 1961  MR: [id number]    Level of Participation: PT refused to attend group Quality of Participation: No participation Interactions with others: No interaction Mood/Affect: appropriate Triggers (if applicable): n/a Cognition: coherent/clear Progress: Other Response: PT was encouraged to come to group, PT refused. 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   Alcohol abuse with intoxication (HCC) 12/23/2023   Involved in legal proceedings 12/08/2023   Marital conflict 12/08/2023   Fall 10/21/2023   Iron  deficiency 05/28/2023   Alcohol withdrawal with inpatient treatment (HCC) 05/24/2023   Total bilirubin, elevated 05/24/2023   Traumatic ecchymosis 05/24/2023   Hypokalemia 04/29/2023   Hypocalcemia 04/29/2023   Essential hypertension 04/29/2023   Depression 04/29/2023   Syncope 04/28/2023   Encounter for screening for HIV 04/16/2023   Encounter for hepatitis C screening test for low risk patient 04/16/2023   Establishing care with new doctor, encounter for 04/16/2023   Hyperthyroidism 04/16/2023   Elevated serum glucose 04/16/2023   Mixed hyperlipidemia 04/16/2023   Centrilobular emphysema  (HCC) 04/16/2023   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) 04/16/2023   Orthostatic hypotension 11/28/2020   Genetic testing 07/15/2020   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

## 2024-05-10 NOTE — Group Note (Signed)
 Group Topic: Overcoming Obstacles  Group Date: 05/10/2024 Start Time: 1715 End Time: 1735 Facilitators: Daved Tinnie HERO, RN; Wilfred Pinch, RN  Department: [facility]  Number of Participants: 5  Group Focus: coping skills Treatment Modality:  Psychoeducation Interventions utilized were exploration, group exercise, problem solving, and story telling Purpose: regain self-worth  Name: Miguel [name] Date of Birth: 12/05/1960  MR: [id number]    Level of Participation: did not participate Quality of Participation: n/a Interactions with others: n/a Mood/Affect: n/a Triggers (if applicable): n/a Cognition: n/a Progress: Other: n/a Response: n/a Plan: patient will be encouraged to participate in groups  Patients Problems:  Patient Active Problem List   Diagnosis Date Noted   Adjustment disorder with depressed mood 05/10/2024   Non-suicidal self-harm (HCC) 05/10/2024   Alcohol use disorder 05/10/2024   Inadequate pain control 04/11/2024   Multiple rib fractures 04/11/2024   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   Alcohol abuse with intoxication (HCC) 12/23/2023   Involved in legal proceedings 12/08/2023   Marital conflict 12/08/2023   Fall 10/21/2023   Iron  deficiency 05/28/2023   Alcohol withdrawal with inpatient treatment (HCC) 05/24/2023   Total bilirubin, elevated 05/24/2023   Traumatic ecchymosis 05/24/2023   Hypokalemia 04/29/2023   Hypocalcemia 04/29/2023   Essential hypertension 04/29/2023   Depression 04/29/2023   Syncope 04/28/2023   Encounter for screening for HIV 04/16/2023   Encounter for hepatitis C screening test for low risk patient 04/16/2023   Establishing care with new doctor, encounter for 04/16/2023   Hyperthyroidism 04/16/2023   Elevated serum glucose 04/16/2023   Mixed hyperlipidemia 04/16/2023   Centrilobular emphysema (HCC) 04/16/2023   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)  04/16/2023   Orthostatic hypotension 11/28/2020   Genetic testing 07/15/2020   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

## 2024-05-10 NOTE — ED Notes (Signed)
 Pt's wife informed that pt has been picked up and transported to [facility]

## 2024-05-10 NOTE — ED Notes (Signed)
 Pt voices concerns of getting back home to care for his wife who is bed bound. Pt wanting to make sure that he has a plan in place for her if he has to be admitted somewhere.

## 2024-05-10 NOTE — ED Notes (Signed)
 Patient currently sleeping and resting in bed. Pt observed/assessed sleeping in bed. RR even and unlabored, appearing in no noted distress.

## 2024-05-10 NOTE — ED Notes (Signed)
 Patient arrived to [facility] area. Pt is AOX4, reports pain on the L abdomen 8/10. States he had a a couple of days ago and has been treating pain with lidocaine  patch or NSAIDs. Patiet denies SI, HI or AVH. Patient is concerned for wife at home. States wife has MS and the most she can do is feed herself. Reports they have no social support and he is the primary care taker. Moderate tremors upon assessment. States he received ativan  at prior hospital. Does have 7 superficial cut marks on abdomen. States he was drinking and felt hopeless and cut himself. Did not intend to kill himself. Safety measures initiated.

## 2024-05-10 NOTE — ED Provider Notes (Signed)
 Emergency Medicine Observation Re-evaluation Note  Miguel [name] is a 63 y.o. male, seen on rounds today.  Pt initially presented to the ED for complaints of Self-harm  Currently, the patient is calm, no acute complaints.  Physical Exam  Blood pressure (!) 141/93, pulse 95, temperature 97.6 F (36.4 C), temperature source Oral, resp. rate 18, height 6' (1.829 m), weight 90.7 kg, SpO2 99%. Physical Exam General: NAD Lungs: CTAB Psych: not agitated  ED Course / MDM  EKG:    I have reviewed the labs performed to date as well as medications administered while in observation.  Recent changes in the last 24 hours include no acute events overnight.    Plan  Current plan is for psych dispo. Patient is under full IVC at this time.   Viviann Pastor, MD 05/10/24 [phone]

## 2024-05-10 NOTE — Group Note (Signed)
 Group Topic: Change and Accountability  Group Date: 05/10/2024 Start Time: 2100 End Time: 2200 Facilitators: Carollynn Genre, NT  Department: [facility]  Number of Participants: 4  Group Focus: activities of daily living skills, chemical dependency education, chemical dependency issues, communication, and daily focus Treatment Modality:  Individual Therapy Interventions utilized were patient education, story telling, and support Purpose: enhance coping skills, express feelings, regain self-worth, and relapse prevention strategies  Name: Miguel [name] Date of Birth: [birth year]  MR: [id number]    Level of Participation: active Quality of Participation: attentive and cooperative Interactions with others: gave feedback Mood/Affect: appropriate Triggers (if applicable): N A Cognition: coherent/clear Progress: Gaining insight Response:  N A Plan: patient will be encouraged to  going to groups  Patients Problems:  Patient Active Problem List   Diagnosis Date Noted   Adjustment disorder with depressed mood 05/10/2024   Non-suicidal self-harm (HCC) 05/10/2024   Alcohol use disorder 05/10/2024   Alcohol use disorder, severe, dependence (HCC) 05/10/2024   Inadequate pain control 04/11/2024   Multiple rib fractures 04/11/2024   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   Alcohol abuse with intoxication (HCC) 12/23/2023   Involved in legal proceedings 12/08/2023   Marital conflict 12/08/2023   Fall 10/21/2023   Iron  deficiency 05/28/2023   Alcohol withdrawal with inpatient treatment (HCC) 05/24/2023   Total bilirubin, elevated 05/24/2023   Traumatic ecchymosis 05/24/2023   Hypokalemia 04/29/2023   Hypocalcemia 04/29/2023   Essential hypertension 04/29/2023   Depression 04/29/2023   Syncope 04/28/2023   Encounter for screening for HIV 04/16/2023   Encounter for hepatitis C screening test for low risk patient 04/16/2023    Establishing care with new doctor, encounter for 04/16/2023   Hyperthyroidism 04/16/2023   Elevated serum glucose 04/16/2023   Mixed hyperlipidemia 04/16/2023   Centrilobular emphysema (HCC) 04/16/2023   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) 04/16/2023   Orthostatic hypotension 11/28/2020   Genetic testing 07/15/2020   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

## 2024-05-10 NOTE — Consult Note (Signed)
 [facility] Consult Note  Patient Name: Miguel [name] MRN: [id number] DOB: 05/15/1961 DATE OF Consult: 05/10/2024  PRIMARY PSYCHIATRIC DIAGNOSES  - Alcohol use disorder - Recurrent non-suicidal self-injury  - Adjustment disorder with depressed mood   RECOMMENDATIONS  Patient expressed willingness to seek help and agreed to consider rehabilitation. Plan includes referral to inpatient or outpatient alcohol rehabilitation program, monitoring for withdrawal symptoms, and supportive counseling.  Medication recommendations:  Initiate CIWA protocol  Non-Medication/therapeutic recommendations:  Continue to monitor for escalation of self-injurious behavior or emergence of suicidal ideation Recommend engagement in psychotherapy focused on coping skills and stress management.   Communication: Treatment team members (and family members if applicable) who were involved in treatment/care discussions and planning, and with whom we spoke or engaged with via secure text/chat, include the following: patient's treatment team.   Thank you for involving us  in the care of this patient. If you have any additional questions or concerns, please call [phone] and ask for me or the provider on-call.  TELEPSYCHIATRY ATTESTATION & CONSENT  As the provider for this telehealth consult, I attest that I verified the patient's identity using two separate identifiers, introduced myself to the patient, provided my credentials, disclosed my location, and performed this encounter via a HIPAA-compliant, real-time, face-to-face, two-way, interactive audio and video platform and with the full consent and agreement of the patient (or guardian as applicable.)  Patient physical location: [place]. Telehealth provider physical location: home office in state of [place].  Video start time: 0257 (Central Time) Video end time: 0313 (Central Time)  IDENTIFYING DATA  Miguel [name] is a 63 y.o. year-old male for whom a psychiatric  consultation has been ordered by the primary provider. The patient was identified using two separate identifiers.  CHIEF COMPLAINT/REASON FOR CONSULT  - I'm worried about my wife. - Pt. presented to the hospital after self-inflicted superficial cuts to the chest with a knife, with ongoing stress related to his wife's medical issues and daily alcohol use.  HISTORY OF PRESENT ILLNESS (HPI)  Per ER notes, Patient is a 63 year old male presenting to [facility] ED under IVC. Per triage note Patient brought in via acems with c/o self-inflicted superficial lacerations to abdomen. Patient noted to have multiple superficial lacerations to abdomen- no bleeding noted on assessment. Patient reported he did this to himself because he was financially struggling and only has 5 dollars to his name. Patient admitted to ETOH use- appears intoxicated upon arrival. Patient non-compliant with dressing out and becoming verbally aggressive with staff and police officer. Patient using racial slurs towards staff and threatening to fight staff members. ED MD notified and orders placed. IM ativan  given as ordered by this RN- patient took injection voluntarily, no manual hold required. Post-administration, patient continues to say inappropriate comments to staff. Security, allied, and Research officer, trade union remain at patient door for safety of staff and other patients. During assessment patient appears alert and oriented x4, his is now calm and cooperative. Patient reports he doesn't recall all of the events that took place tonight he only reports I just cut myself, I guess on my stomach, but it didn't even bleed. Patient reports feeling stressed. Patient has presented to this ED under similar presentation on numerous occasions, he lives with his wife and takes care of his wife due to his wife having MS. Patient is a regular drinker, he reports drinking 1/2 pint of liquor today, BAL was 366 upon arrival. Patient denies any current SI, denies  HI/AH/VH.  During evaluation, patient reports that he presents to the ER following an episode of self-inflicted superficial lacerations to his chest and abdomen with a knife. The incident occurred in the context of significant psychosocial stressors, primarily related to his wife's ongoing medical issues, including repeated hospitalizations and complications with her suprapubic catheter. He described persistent worry about his wife's health and the burden of being her primary caregiver since his retirement three years ago. He reported spending most of his time at home with her, which he acknowledged has contributed to feelings of boredom and stress.  Upon further inquiry, it was revealed that this was not his first episode of self-harm; previous incidents involved cutting his arm, with the last occurrence in February. He denied any intent to end his life during these episodes, attributing the behavior to stress relief or possibly seeking attention, though he expressed ambivalence and some confusion about his motivations. He explicitly stated he does not wish to die and is not currently experiencing suicidal ideation.  Chronic daily alcohol use was acknowledged, with recent consumption described as half a pint per day. He admitted to drinking nearly every day, and alcohol use appears to be a contributing factor to both his self-harm behaviors and recent falls. He recounted a recent fall while taking out the trash, resulting in persistent lower back pain, and a prior hospitalization in May following another fall, both associated with his alcohol use. He reported ongoing financial stress, with limited funds available and most resources allocated to household expenses and alcohol. BAL was 366 on arrival to the ER.  Symptoms of alcohol withdrawal were endorsed, including nausea and shakiness, though he denied headaches. He expressed willingness to seek help for his alcohol use and was receptive to the idea of  rehabilitation. No evidence of psychosis, mania, or other acute psychiatric symptoms was elicited during the interview. The overall clinical picture is characterized by chronic alcohol use disorder, recurrent non-suicidal self-injury, caregiver stress, and financial strain.  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   metoprolol  succinate (TOPROL  XL) 25 MG 24 hr tablet Take 1 tablet (25 mg total) by mouth daily.   lisinopril  (ZESTRIL ) 40 MG tablet TAKE ONE TABLET BY MOUTH ONCE EVERY DAY.   fluticasone -salmeterol (ADVAIR ) 100-50 MCG/ACT AEPB Inhale 1 puff into the lungs 2 (two) times daily.   colchicine  0.6 MG tablet Take 1 tablet (0.6 mg total) by mouth 2 (two) times daily.   ferrous sulfate  325 (65 FE) MG tablet Take 1 tablet (325 mg total) by mouth daily with breakfast.   folic acid  (FOLVITE ) 1 MG tablet Take  1 tablet (1 mg total) by mouth daily.   Multiple Vitamin (MULTIVITAMIN WITH MINERALS) TABS tablet Take 1 tablet by mouth daily.   thiamine  (VITAMIN B-1) 100 MG tablet Take 1 tablet (100 mg total) by mouth daily.   diclofenac  Sodium (VOLTAREN ) 1 % GEL Apply 4 g topically 3 (three) times daily as needed (left chest wall pain).    albuterol  (VENTOLIN  HFA) 108 (90 Base) MCG/ACT inhaler Inhale 2 puffs into the lungs every 4 (four) hours as needed for wheezing or shortness of breath.   sertraline  (ZOLOFT ) 100 MG tablet Take 1 tablet (100 mg total) by mouth daily.   naltrexone  (DEPADE) 50 MG tablet Take 1 tablet (50 mg total) by mouth daily.   lidocaine  (XYLOCAINE ) 5 % ointment Apply 1 Application topically as needed. Apply to left chest   acetaminophen  (TYLENOL ) 325 MG tablet Take 2 tablets (650 mg total) by mouth every 6 (six) hours as needed for mild pain (pain score 1-3), fever or headache (or Fever >/= 101).   traZODone  (DESYREL ) 100 MG tablet Take 1 tablet (100 mg total) by mouth at bedtime as needed.   oxyCODONE -acetaminophen  (PERCOCET) 5-325 MG tablet Take 1 tablet by mouth every 4 (four) hours as needed for severe pain (pain score 7-10).   acetaminophen  (TYLENOL ) 500 MG tablet Take 2 tablets (1,000 mg total) by mouth every 8 (eight) hours as needed.   ibuprofen  (MOTRIN  IB) 200 MG tablet Take 3 tablets (600 mg total) by mouth every 8 (eight) hours as needed.   lidocaine  (LIDODERM ) 5 % Place 1 patch onto the skin daily for 10 days. Remove & Discard patch within 12 hours or as directed by MD     ALLERGIES  No Known Allergies  SOCIAL & SUBSTANCE USE HISTORY  Social History   Socioeconomic History   Marital status: Married    Spouse name: Not on file   Number of children: Not on file   Years of education: Not on file   Highest education level: Not on file  Occupational History   Not on file  Tobacco Use   Smoking status: Former    Current packs/day: 0.00    Types: Cigarettes    Quit date: 03/19/1981    Years since quitting: 43.1   Smokeless tobacco: Never  Vaping Use   Vaping status: Never Used  Substance and Sexual Activity   Alcohol use: Yes    Alcohol/week: 24.0 standard drinks of alcohol    Types: 24 Shots of liquor per week    Comment: lasst used 8 days ago   Drug use: Not Currently    Types:  Marijuana    Comment: last use 09/2021   Sexual activity: Not Currently  Other Topics Concern   Not on file  Social History Narrative   Lives at home with his wife and takes care of his wife. Independent at baseline      - Biggest strain is financial but doesn't think they could got get more help.      Patient expressed interest in possible financial assistance. Informed written consent obtained   Social Drivers of Health   Financial Resource Strain: High Risk (09/18/2020)   Overall Financial Resource Strain (CARDIA)    Difficulty of Paying Living Expenses: Very hard  Food Insecurity: No Food Insecurity (04/11/2024)   Hunger Vital Sign    Worried About Running Out of Food in the Last Year: Never true    Ran Out of Food in the Last Year: Never true  Transportation Needs: No Transportation Needs (04/11/2024)   PRAPARE - Administrator, Civil Service (Medical): No    Lack of Transportation (Non-Medical): No  Physical Activity: Sufficiently Active (09/18/2020)   Exercise Vital Sign    Days of Exercise per Week: 7 days    Minutes of Exercise per Session: 60 min  Stress: Stress Concern Present (09/18/2020)   [employer] of Occupational Health - Occupational Stress Questionnaire    Feeling of Stress : Very much  Social Connections: Socially Isolated (09/18/2020)   Social Connection and Isolation Panel    Frequency of Communication with Friends and Family: Once a week    Frequency of Social Gatherings with Friends and Family: Once a week    Attends Religious Services: Never    Database administrator or Organizations: No    Attends Banker Meetings: Never    Marital Status: Married   Social History   Tobacco Use  Smoking Status Former   Current packs/day: 0.00   Types: Cigarettes   Quit date: 03/19/1981   Years since quitting: 43.1  Smokeless Tobacco Never   Social History   Substance and Sexual Activity  Alcohol Use Yes   Alcohol/week: 24.0  standard drinks of alcohol   Types: 24 Shots of liquor per week   Comment: lasst used 8 days ago   Social History   Substance and Sexual Activity  Drug Use Not Currently   Types: Marijuana   Comment: last use 09/2021    Additional pertinent information .  FAMILY HISTORY  Family History  Problem Relation Age of Onset   Alcohol abuse Father    Breast cancer Mother 43   Anxiety disorder Brother    Other Maternal Grandmother        unknown medical history   Other Maternal Grandfather        unknow medical history   Other Paternal Grandmother        unknown medical history   Other Paternal Grandfather        unknown medical history   Healthy Brother    Family Psychiatric History (if known):    MENTAL STATUS EXAM (MSE)  Mental Status Exam: General Appearance: sitting on his bed without a shirt on  Orientation:  Full (Time, Place, and Person)  Memory:  intact  Concentration:  intact  Recall:  intact  Attention  intact  Eye Contact:  Fair  Speech:  Clear and Coherent  Language:  Good  Volume:  Normal  Mood: I am just stressed  Affect:  appears congruent with the content discussed, with expressions of worry and stress regarding his wife's health and his own situation.  Thought Process:  Goal Directed and Linear  Thought Content:  The patient expressed significant worry about his wife's health and financial stress. He described cutting himself as a response to stress, boredom, or possibly for attention, but denied intent to die, stating, I Kippy't wanna kill myself. No. There is no mention of auditory hallucinations, visual hallucinations, paranoia, obsessive thoughts, delusional thoughts, or intrusive thoughts.  Suicidal Thoughts:  No  Homicidal Thoughts:  No  Judgement:  Fair  Insight:  Fair  Psychomotor Activity:  Negative  Akathisia:  Negative  Fund of Knowledge:  Fair    Assets:  Communication Skills Desire for Improvement Housing  Cognition:  WNL  ADL's:   Intact  AIMS (if indicated):       VITALS  Blood pressure (!) 141/93, pulse 95, temperature 97.6 F (  36.4 C), temperature source Oral, resp. rate 18, height 6' (1.829 m), weight 90.7 kg, SpO2 99%.  LABS  Admission on 05/09/2024  Component Date Value Ref Range Status   WBC 05/09/2024 9.8  4.0 - 10.5 K/uL Final   RBC 05/09/2024 4.67  4.22 - 5.81 MIL/uL Final   Hemoglobin 05/09/2024 13.5  13.0 - 17.0 g/dL Final   HCT 93/75/7974 41.9  39.0 - 52.0 % Final   MCV 05/09/2024 89.7  80.0 - 100.0 fL Final   MCH 05/09/2024 28.9  26.0 - 34.0 pg Final   MCHC 05/09/2024 32.2  30.0 - 36.0 g/dL Final   RDW 93/75/7974 17.6 (H)  11.5 - 15.5 % Final   Platelets 05/09/2024 180  150 - 400 K/uL Final   nRBC 05/09/2024 0.0  0.0 - 0.2 % Final   Performed at [facility], [address] [place], [place] [address]   Sodium 05/09/2024 142  135 - 145 mmol/L Final   Potassium 05/09/2024 4.3  3.5 - 5.1 mmol/L Final   Chloride 05/09/2024 107  98 - 111 mmol/L Final   CO2 05/09/2024 26  22 - 32 mmol/L Final   Glucose, Bld 05/09/2024 95  70 - 99 mg/dL Final   Glucose reference range applies only to samples taken after fasting for at least 8 hours.   BUN 05/09/2024 15  8 - 23 mg/dL Final   Creatinine, Ser 05/09/2024 0.96  0.61 - 1.24 mg/dL Final   Calcium  05/09/2024 9.0  8.9 - 10.3 mg/dL Final   Total Protein 93/75/7974 7.3  6.5 - 8.1 g/dL Final   Albumin  05/09/2024 3.9  3.5 - 5.0 g/dL Final   AST 93/75/7974 19  15 - 41 U/L Final   ALT 05/09/2024 14  0 - 44 U/L Final   Alkaline Phosphatase 05/09/2024 65  38 - 126 U/L Final   Total Bilirubin 05/09/2024 0.8  0.0 - 1.2 mg/dL Final   GFR, Estimated 05/09/2024 >60  >60 mL/min Final   Comment: (NOTE) Calculated using the CKD-EPI Creatinine Equation (2021)    Anion gap 05/09/2024 9  5 - 15 Final   Performed at [facility], [address] [place], [place] [address]   Alcohol, Ethyl (B) 05/09/2024 366 (HH)  <15 mg/dL Final   Comment: CRITICAL  RESULT CALLED TO, READ BACK BY AND VERIFIED WITH ANNIE SMITH 05/09/24 1509 MU (NOTE) For medical purposes only. Performed at [facility], [address] [place], [place] [address]    Salicylate Lvl 05/09/2024 <7.0 (L)  7.0 - 30.0 mg/dL Final   Performed at [facility], [address] [place], [place] [address]   Acetaminophen  (Tylenol ), Serum 05/09/2024 <10 (L)  10 - 30 ug/mL Final   Comment: (NOTE) Therapeutic concentrations vary significantly. A range of 10-30 ug/mL  may be an effective concentration for many patients. However, some  are best treated at concentrations outside of this range. Acetaminophen  concentrations >150 ug/mL at 4 hours after ingestion  and >50 ug/mL at 12 hours after ingestion are often associated with  toxic reactions.  Performed at [facility], [address] [place], [place] [address]     PSYCHIATRIC REVIEW OF SYSTEMS (ROS)  ROS: Notable for the following relevant positive findings: ROS  Additional findings:      Musculoskeletal: No abnormal movements observed      Gait & Station: Laying/Sitting      Pain Screening: Present - mild to moderate      Nutrition &  Dental Concerns: none reported  RISK FORMULATION/ASSESSMENT  Is the patient experiencing any suicidal or homicidal ideations: No       Explain if yes:  Protective factors considered for safety management: include a sense of obligation to his wife, willingness to seek help, and engagement in the interview. The patient denied current suicidal ideation or intent, stating, I Avik't wanna kill myself. No, and described self-injury as a response to stress or boredom rather than a desire to die. No active or passive suicidal thoughts or plans were endorsed.   Risk factors/concerns considered for safety management:  include male gender, chronic medical issues in the family, chronic mental health issues (recurrent self-injury), daily alcohol use, financial stress, and  social isolation due to retirement and caregiving responsibilities. Modifiable risk factors include ongoing alcohol use, financial stress, and lack of engagement in substance use treatment.   Is there a safety management plan with the patient and treatment team to minimize risk factors and promote protective factors: Yes           Explain: substance abuse treatment Is crisis care placement or psychiatric hospitalization recommended: No     Based on my current evaluation and risk assessment, patient is determined at this time to be at:  Moderate Risk  *RISK ASSESSMENT Risk assessment is a dynamic process; it is possible that this patient's condition, and risk level, may change. This should be re-evaluated and managed over time as appropriate. Please re-consult psychiatric consult services if additional assistance is needed in terms of risk assessment and management. If your team decides to discharge this patient, please advise the patient how to best access emergency psychiatric services, or to call 911, if their condition worsens or they feel unsafe in any way.   Ines Hock, NP Telepsychiatry Consult Services

## 2024-05-10 NOTE — ED Notes (Signed)
 Patient given a breakfast tray.

## 2024-05-10 NOTE — BH Assessment (Signed)
 Comprehensive Clinical Assessment (CCA) Note  05/10/2024 Miguel [name] [id number]  Chief Complaint: Patient is a 63 year old male presenting to [facility] ED under IVC. Per triage note Patient brought in via acems with c/o self-inflicted superficial lacerations to abdomen. Patient noted to have multiple superficial lacerations to abdomen- no bleeding noted on assessment. Patient reported he did this to himself because he was financially struggling and only has 5 dollars to his name. Patient admitted to ETOH use- appears intoxicated upon arrival. Patient non-compliant with dressing out and becoming verbally aggressive with staff and police officer. Patient using racial slurs towards staff and threatening to fight staff members. ED MD notified and orders placed. IM ativan  given as ordered by this RN- patient took injection voluntarily, no manual hold required. Post-administration, patient continues to say inappropriate comments to staff. Security, allied, and Research officer, trade union remain at patient door for safety of staff and other patients. During assessment patient appears alert and oriented x4, his is now calm and cooperative. Patient reports he doesn't recall all of the events that took place tonight he only reports I just cut myself, I guess on my stomach, but it didn't even bleed. Patient reports feeling stressed. Patient has presented to this ED under similar presentation on numerous occasions, he lives with his wife and takes care of his wife due to his wife having MS. Patient is a regular drinker, he reports drinking 1/2 pint of liquor today, BAL was 366 upon arrival. Patient denies any current SI, denies HI/AH/VH.  Chief Complaint  Patient presents with   Self-harm   Visit Diagnosis: Alcohol use disorder, severe. Depression   CCA Screening, Triage and Referral (STR)  Patient Reported Information How did you hear about us ? Legal System  Referral name: No data recorded Referral phone number: No data  recorded  Whom do you see for routine medical problems? No data recorded Practice/Facility Name: No data recorded Practice/Facility Phone Number: No data recorded Name of Contact: No data recorded Contact Number: No data recorded Contact Fax Number: No data recorded Prescriber Name: No data recorded Prescriber Address (if known): No data recorded  What Is the Reason for Your Visit/Call Today? Patient brought in via acems with c/o self-inflicted superficial lacerations to abdomen. Patient noted to have multiple superficial lacerations to abdomen- no bleeding noted on assessment. Patient reported he did this to himself because he was financially struggling and only has 5 dollars to his name. Patient admitted to ETOH use- appears intoxicated upon arrival. Patient non-compliant with dressing out and becoming verbally aggressive with staff and police officer. Patient using racial slurs towards staff and threatening to fight staff members. ED MD notified and orders placed. IM ativan  given as ordered by this RN- patient took injection voluntarily, no manual hold required. Post-administration, patient continues to say inappropriate comments to staff. Security, allied, and Research officer, trade union remain at patient door for safety of staff and other patients.  How Long Has This Been Causing You Problems? > than 6 months  What Do You Feel Would Help You the Most Today? Treatment for Depression or other mood problem   Have You Recently Been in Any Inpatient Treatment (Hospital/Detox/Crisis Center/28-Day Program)? No data recorded Name/Location of Program/Hospital:No data recorded How Long Were You There? No data recorded When Were You Discharged? No data recorded  Have You Ever Received Services From [facility] Before? No data recorded Who Do You See at [facility]? No data recorded  Have You Recently Had Any Thoughts About Hurting  Yourself? Yes  Are You Planning to Commit Suicide/Harm Yourself At This time?  No   Have you Recently Had Thoughts About Hurting Someone [name]? No  Explanation: No data recorded  Have You Used Any Alcohol or Drugs in the Past 24 Hours? Yes  How Long Ago Did You Use Drugs or Alcohol? No data recorded What Did You Use and How Much? Alcohol 1/2 pint   Do You Currently Have a Therapist/Psychiatrist? No  Name of Therapist/Psychiatrist: No data recorded  Have You Been Recently Discharged From Any Office Practice or Programs? No  Explanation of Discharge From Practice/Program: No data recorded    CCA Screening Triage Referral Assessment Type of Contact: Face-to-Face  Is this Initial or Reassessment? No data recorded Date Telepsych consult ordered in [facility]:  No data recorded Time Telepsych consult ordered in [facility]:  No data recorded  Patient Reported Information Reviewed? No data recorded Patient Left Without Being Seen? No data recorded Reason for Not Completing Assessment: No data recorded  Collateral Involvement: No data recorded  Does Patient Have a Court Appointed Legal Guardian? No data recorded Name and Contact of Legal Guardian: No data recorded If Minor and Not Living with Parent(s), Who has Custody? No data recorded Is CPS involved or ever been involved? Never  Is APS involved or ever been involved? Never   Patient Determined To Be At Risk for Harm To Self or Others Based on Review of Patient Reported Information or Presenting Complaint? Yes, for Self-Harm  Method: No data recorded Availability of Means: No data recorded Intent: No data recorded Notification Required: No data recorded Additional Information for Danger to Others Potential: No data recorded Additional Comments for Danger to Others Potential: No data recorded Are There Guns or Other Weapons in Your Home? No  Types of Guns/Weapons: Patient had access to a knife  Are These Weapons Safely Secured?                            No data recorded Who Could Verify You Are Able To Have  These Secured: No data recorded Do You Have any Outstanding Charges, Pending Court Dates, Parole/Probation? No data recorded Contacted To Inform of Risk of Harm To Self or Others: No data recorded  Location of Assessment: [facility] ED   Does Patient Present under Involuntary Commitment? Yes  IVC Papers Initial File Date: No data recorded  [place] of Residence: [place]   Patient Currently Receiving the Following Services: Medication Management   Determination of Need: Emergent (2 hours)   Options For Referral: No data recorded    CCA Biopsychosocial Intake/Chief Complaint:  No data recorded Current Symptoms/Problems: No data recorded  Patient Reported Schizophrenia/Schizoaffective Diagnosis in Past: No   Strengths: Patient is able to communicate his needs  Preferences: No data recorded Abilities: No data recorded  Type of Services Patient Feels are Needed: No data recorded  Initial Clinical Notes/Concerns: No data recorded  Mental Health Symptoms Depression:  Change in energy/activity; Fatigue; Hopelessness; Difficulty Concentrating   Duration of Depressive symptoms: Greater than two weeks   Mania:  None   Anxiety:   Fatigue; Restlessness; Difficulty concentrating; Worrying   Psychosis:  None   Duration of Psychotic symptoms: No data recorded  Trauma:  None   Obsessions:  None   Compulsions:  Poor Insight; Repeated behaviors/mental acts   Inattention:  None   Hyperactivity/Impulsivity:  None   Oppositional/Defiant Behaviors:  None   Emotional Irregularity:  Recurrent suicidal behaviors/gestures/threats; Unstable self-image   Other Mood/Personality Symptoms:  No data recorded   Mental Status Exam Appearance and self-care  Stature:  Average   Weight:  Overweight   Clothing:  Disheveled   Grooming:  Neglected   Cosmetic use:  None   Posture/gait:  Normal   Motor activity:  Not Remarkable   Sensorium  Attention:  Normal   Concentration:   Normal   Orientation:  X5   Recall/memory:  Normal   Affect and Mood  Affect:  Appropriate   Mood:  Depressed   Relating  Eye contact:  Normal   Facial expression:  Responsive   Attitude toward examiner:  Cooperative   Thought and Language  Speech flow: Slurred   Thought content:  Appropriate to Mood and Circumstances   Preoccupation:  None   Hallucinations:  None   Organization:  No data recorded  [employer] of Knowledge:  Fair   Intelligence:  Average   Abstraction:  Normal   Judgement:  Fair   Dance movement psychotherapist:  Adequate   Insight:  Lacking; Poor; Denial   Decision Making:  Impulsive   Social Functioning  Social Maturity:  Impulsive   Social Judgement:  Heedless   Stress  Stressors:  Relationship; Transitions; Financial; Family conflict   Coping Ability:  Exhausted; Overwhelmed; Deficient supports   Skill Deficits:  None   Supports:  Support needed     Religion: Religion/Spirituality Are You A Religious Person?: No  Leisure/Recreation: Leisure / Recreation Do You Have Hobbies?: No  Exercise/Diet: Exercise/Diet Do You Exercise?: No Have You Gained or Lost A Significant Amount of Weight in the Past Six Months?: No Do You Follow a Special Diet?: No Do You Have Any Trouble Sleeping?: No   CCA Employment/Education Employment/Work Situation: Employment / Work Situation Employment Situation: Unemployed Patient's Job has Been Impacted by Current Illness: No Has Patient ever Been in Equities trader?: No  Education: Education Is Patient Currently Attending School?: No Did You Have An Individualized Education Program (IIEP): No Did You Have Any Difficulty At [employer]?: No Patient's Education Has Been Impacted by Current Illness: No   CCA Family/Childhood History Family and Relationship History: Family history Marital status: Married What types of issues is patient dealing with in the relationship?: Patient's wife has  MS Additional relationship information: None reported Does patient have children?: No  Childhood History:  Childhood History By whom was/is the patient raised?: Both parents Did patient suffer any verbal/emotional/physical/sexual abuse as a child?: No Did patient suffer from severe childhood neglect?: No Has patient ever been sexually abused/assaulted/raped as an adolescent or adult?: No Was the patient ever a victim of a crime or a disaster?: No Witnessed domestic violence?: No Has patient been affected by domestic violence as an adult?: No  Child/Adolescent Assessment:     CCA Substance Use Alcohol/Drug Use: Alcohol / Drug Use Pain Medications: see mar Prescriptions: see mar Over the Counter: see mar History of alcohol / drug use?: Yes Longest period of sobriety (when/how long): Unable to quantify Negative Consequences of Use: Personal relationships Withdrawal Symptoms: Change in blood pressure, Tingling, Tremors, Weakness, Patient aware of relationship between substance abuse and physical/medical complications Substance #1 Name of Substance 1: Alcohol 1 - Age of First Use: unknown 1 - Amount (size/oz): 1/2 pint 1 - Frequency: daily 1 - Duration: patient has been drinking for years 1 - Last Use / Amount: 05/09/24 1 - Method of Aquiring: purchasing 1- Route of Use: oral  ASAM's:  Six Dimensions of Multidimensional Assessment  Dimension 1:  Acute Intoxication and/or Withdrawal Potential:      Dimension 2:  Biomedical Conditions and Complications:      Dimension 3:  Emotional, Behavioral, or Cognitive Conditions and Complications:     Dimension 4:  Readiness to Change:     Dimension 5:  Relapse, Continued use, or Continued Problem Potential:     Dimension 6:  Recovery/Living Environment:     ASAM Severity Score:    ASAM Recommended Level of Treatment: ASAM Recommended Level of Treatment: Level III Residential Treatment   Substance use  Disorder (SUD) Substance Use Disorder (SUD)  Checklist Symptoms of Substance Use: Continued use despite having a persistent/recurrent physical/psychological problem caused/exacerbated by use, Evidence of withdrawal (Comment), Continued use despite persistent or recurrent social, interpersonal problems, caused or exacerbated by use, Large amounts of time spent to obtain, use or recover from the substance(s), Evidence of tolerance, Presence of craving or strong urge to use, Recurrent use that results in a failure to fulfill major role obligations (work, school, home), Social, occupational, recreational activities given up or reduced due to use, Substance(s) often taken in larger amounts or over longer times than was intended, Persistent desire or unsuccessful efforts to cut down or control use  Recommendations for Services/Supports/Treatments:    DSM5 Diagnoses: Patient Active Problem List   Diagnosis Date Noted   Inadequate pain control 04/11/2024   Multiple rib fractures 04/11/2024   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   Alcohol abuse with intoxication (HCC) 12/23/2023   Involved in legal proceedings 12/08/2023   Marital conflict 12/08/2023   Fall 10/21/2023   Iron  deficiency 05/28/2023   Alcohol withdrawal with inpatient treatment (HCC) 05/24/2023   Total bilirubin, elevated 05/24/2023   Traumatic ecchymosis 05/24/2023   Hypokalemia 04/29/2023   Hypocalcemia 04/29/2023   Essential hypertension 04/29/2023   Depression 04/29/2023   Syncope 04/28/2023   Encounter for screening for HIV 04/16/2023   Encounter for hepatitis C screening test for low risk patient 04/16/2023   Establishing care with new doctor, encounter for 04/16/2023   Hyperthyroidism 04/16/2023   Elevated serum glucose 04/16/2023   Mixed hyperlipidemia 04/16/2023   Centrilobular emphysema (HCC) 04/16/2023   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) 04/16/2023   Orthostatic hypotension  11/28/2020   Genetic testing 07/15/2020   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    Patient Centered Plan: Patient is on the following Treatment Plan(s):  Depression and Substance Abuse   Referrals to Alternative Service(s): Referred to Alternative Service(s):   Place:   Date:   Time:    Referred to Alternative Service(s):   Place:   Date:   Time:    Referred to Alternative Service(s):   Place:   Date:   Time:    Referred to Alternative Service(s):   Place:   Date:   Time:      @[email or web address] @  [employer], LCAS-A

## 2024-05-10 NOTE — BH Assessment (Addendum)
 Patient has been accepted to [facility] [address] [place], [place] ) for 05/10/24.  Accepting physician is Dr Larina is the attending.   Dx Alcohol use disorder - Recurrent non-suicidal self-injury  - Adjustment disorder with depressed mood  Call report to [phone] .  ED staff and pt's care team notified.

## 2024-05-10 NOTE — ED Notes (Signed)
 Patient anxious and cooperative. Patient initially in group room and stepped out to due anxiousness about wife. Verbal support given. Patient able to call wife. Patient given PRN Nausea medication due to patient report of nausea.

## 2024-05-11 ENCOUNTER — Other Ambulatory Visit: Payer: Self-pay

## 2024-05-11 DIAGNOSIS — F331 Major depressive disorder, recurrent, moderate: Secondary | ICD-10-CM | POA: Diagnosis not present

## 2024-05-11 DIAGNOSIS — F1020 Alcohol dependence, uncomplicated: Secondary | ICD-10-CM | POA: Diagnosis not present

## 2024-05-11 DIAGNOSIS — F1994 Other psychoactive substance use, unspecified with psychoactive substance-induced mood disorder: Secondary | ICD-10-CM | POA: Diagnosis not present

## 2024-05-11 LAB — POCT URINE DRUG SCREEN - MANUAL ENTRY (I-SCREEN)
POC Amphetamine UR: NOT DETECTED
POC Buprenorphine (BUP): NOT DETECTED
POC Cocaine UR: NOT DETECTED
POC Marijuana UR: POSITIVE — AB
POC Methadone UR: NOT DETECTED
POC Methamphetamine UR: NOT DETECTED
POC Morphine: NOT DETECTED
POC Oxazepam (BZO): POSITIVE — AB
POC Oxycodone UR: NOT DETECTED
POC Secobarbital (BAR): NOT DETECTED

## 2024-05-11 NOTE — ED Notes (Signed)
 Patient calm and cooperative. Patient currently sleeping and resting in bed. Pt observed/assessed sleeping in room. RR even and unlabored, appearing in no noted distress.

## 2024-05-11 NOTE — ED Notes (Signed)
 Patient A&O x 4, ambulatory. Patient discharged in no acute distress. Patient denied SI/HI, A/VH upon discharge. Patient verbalized understanding of all discharge instructions reviewed on AVS via staff, to include follow up recommendations and safety. Suicide safety plan completed and reviewed with Clinical research associate. A copy given to pt. Patient reported mood 10/10.  Pt had no belongings. Patient escorted to lobby via staff for transport to home. Safety maintained.

## 2024-05-11 NOTE — ED Notes (Signed)
 Patient currently sleeping and resting in bed. Pt observed/assessed in room. RR even and unlabored, appearing in no noted distress.

## 2024-05-11 NOTE — ED Notes (Signed)
 Patient A&Ox4. Denies intent to harm self/others when asked. Denies A/VH. Patient denies any physical complaints when asked. Denies withdrawal sx from ETOH. Pt voice worry about leaving his wife alone in the home. Pt states, I'm her caregiver and no one is there with her right now. I need to be home. I made a mistake and drank 1/2 pint of liquor last night. I won't do that again. Support and encouragement provided. Routine safety checks conducted according to facility protocol. Encouraged patient to notify staff if thoughts of harm toward self or others arise. Patient verbalize understanding and agreement. Will continue to monitor for safety.

## 2024-05-11 NOTE — ED Provider Notes (Signed)
 Facility Based Crisis Admission H&P  Date: 05/11/24 Patient Name: Miguel [name] MRN: [id number] Chief Complaint: I hurt myself because I was drunk but I didn't want to kill myself  Diagnoses:  Final diagnoses:  Alcohol use disorder  Substance induced mood disorder (HCC)  Major depressive disorder, recurrent episode, moderate (HCC)    HPI:  Miguel [name] is a 63 year old male with a past psychiatric history of alcohol use disorder and depression and at least 4 inpatient psychiatric admissions (most recent a couple of months ago at [facility]) who was admitted to the [facility] under IVC for superficial lacerations on his abdomen and concern for harm to self.   On admission, patient reports that he had conflict with his wife regarding his alcohol use so he cut himself after he had a 1/2 pint of vodka. He reports this was not a suicide attempt, reports this often happens after he drinks. He reports the trigger for him drinking was that he wanted to relax because his wife was gone and she was coming home from an appointment. He reports he has been drinking 1/2 pint of vodka 3 days a week since his 53s. He reports that he started drinking in his 42s. He reports no history of delirium tremens or seizures from alcohol disorder. He reports that his last period of sobriety was 3-4 weeks at the end of the year last year after his father passed away. He reports his father was an alcoholic and that was a motivating factor for him at the time but then he started to have increased anxiety and started to use alcohol because he thought it would help. He currently reports some nausea and mild tremors (but has tremors at baseline) as withdrawal symptom. He denies any other current withdrawal symptoms. He does not appear to be in severe withdrawal. His last CIWA was 3 for tremor and he received PRN ativan  overnight. He denies any other substance use (UDS+THC, oxazepam).   He reports psychiatric symptoms of increased guilt,  worthlessness, and feeling depressed. He reports decreased energy, concentration, issues with sleep. He becomes tearful when discussing his alcohol use and reports he wants to abstain from alcohol use. He also reports increased anxiety, reports feeling increased anxiety when he is not drinking. He denies any paranoia or first rank psychiatric symptoms. He does not appear to be responding to internal stimuli.   Patient reports that he was discharged from [facility] a couple of months ago after a 3-4 day stay for his alcohol use. He reports he may have been started on zoloft  while he was there but is unsure. He reports he felt like naltrexone  didn't make him feel good and he slept well with trazodone . He reports after that he followed up with [facility] once and was prescribed seroquel  but he felt like it wasn't helpful so he stopped. He denies current outpatient psychiatrist/therapist. He denies history of suicide attempt (per chart review was last brought into [facility] ED under IVC for suicide attempt when he slit his wrist). He reports history of self-harm. He reports only past psychiatric diagnosis of alcohol use disorder. He reports a family history of alcoholism in his father.   He reports that he lives with and helps take care of his wife who has MS. 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 He reports that they have been married for 40 years. He reports she is his main  source of support. He denies legal issues, denies access to firearms.   Patient reports that his goal is to not go drinking again. He reports a motivating factor is thinking of his wife and not going to the [place] which is right next to the [facility] store. He gave permission to call his wife for additional collateral.   Wife Pam contacted at [phone].  She reports that the patient cut himself after they had an argument about his drinking. Reports he he got stressed and angry  while he was intoxicated and said he would hurt himself. She does not believe that this was a suicide attempt and she states patient was not making suicidal statements leading up to admission. She reports patient had no SI/HI/AVH. She reports there are no guns in the home and that the knives at home are dull. She reports no safety concerns with patient discharging.   Discussed with both patient and wife that patient does not meet IVC criteria at this time. Patient was released from [facility] and requested discharge and reported interest in continuing with outpatient services.   Risk Assessment: A suicide and violence risk assessment was performed as part of this evaluation. There patient is deemed to be at chronic elevated risk for self-harm/suicide given the following factors: self-harming behaviors (cutting self), impulsive tendencies, current substance abuse, previous acts of self harm, childhood abuse, chronic impulsivity, and chronic poor judgement. These risk factors are mitigated by the following factors: lack of active SI/HI, no known access to weapons or firearms, motivation for treatment, supportive family, sense of responsibility to family and social supports, presence of a significant relationship, presence of an available support system, expresses purpose for living, safe housing, support system in agreement with treatment recommendations, and presence of a safety plan with follow-up care. The patient is deemed to be at chronic elevated risk for violence given the following factors: recent agitation, current substance abuse, and chronic impulsivity. These risk factors are mitigated by the following factors: no active symptoms of psychosis, no active symptoms of mania, and connectedness to family. There is no acute risk for suicide or violence at this time. The patient was educated about relevant modifiable risk factors including following recommendations for treatment of psychiatric illness and  abstaining from substance abuse.   While future psychiatric events cannot be accurately predicted, the patient does not currently require  acute inpatient psychiatric care and does not currently meet [place]  involuntary commitment criteria.     PHQ 2-9:  [employer] Office Visit from 04/16/2023 in [facility] Treatment Planning from 10/29/2022 in [facility] Treatment Planning from 10/14/2022 in [facility]  Thoughts that you would be better off dead, or of hurting yourself in some way Not at all Not at all Not at all  PHQ-9 Total Score 2 4 6     Flowsheet Row ED from 05/10/2024 in [facility] ED from 05/09/2024 in [facility] Emergency Department at [facility] ED from 04/30/2024 in [facility] Emergency Department at [facility]  C-SSRS RISK CATEGORY High Risk High Risk No Risk    Screenings    Flowsheet Row Most Recent Value  CIWA-Ar Total 3    Musculoskeletal  Strength & Muscle Tone: within normal limits Gait & Station: broad based Patient leans: Right  Psychiatric Specialty Exam  Presentation General Appearance:  Casual  Eye Contact: Fair  Speech: Normal  Speech Volume: Normal volume  Handedness:  Right   Mood and Affect  Mood: I Victorio't want to drink anymore  Affect: Appropriate   Thought Process  Thought Processes: Coherent  Descriptions of Associations:Intact  Orientation:Full (Time, Place and Person)  Thought Content: No SI, HI, AVH Diagnosis of Schizophrenia or Schizoaffective disorder in past: No   Hallucinations: None Ideas of Reference:None  Suicidal Thoughts: None Homicidal Thoughts: None  Sensorium  Memory: Recent Good; Remote Fair  Judgment: Limited  Insight: Limited   Executive Functions  Concentration: Fair  Attention Span: Fair  Recall: [employer] of  Knowledge: Fair  Language: Fair   Psychomotor Activity  Psychomotor Activity: Normal   Assets  Assets: Manufacturing systems engineer; Financial Resources/Insurance; Housing; Social Support; Vocational/Educational   Sleep  Sleep: Fair  Physical Exam Review of Systems  Constitutional: Negative.   HENT: Negative.    Eyes: Negative.   Respiratory: Negative.    Cardiovascular: Negative.   Gastrointestinal: Negative.   Genitourinary: Negative.   Musculoskeletal:  Positive for myalgias.  Skin: Negative.   Neurological:  Positive for tremors.  Endo/Heme/Allergies: Negative.   Psychiatric/Behavioral:  Positive for substance abuse. Negative for hallucinations and suicidal ideas. The patient is not nervous/anxious.    Physical Exam Constitutional:      Appearance: the patient is not toxic-appearing.  Pulmonary:     Effort: Pulmonary effort is normal.  Neurological:     General: No focal deficit present.     Mental Status: the patient is alert and oriented to person, place, and time.   Blood pressure 128/79, pulse 92, temperature 98 F (36.7 C), temperature source Oral, resp. rate 18, SpO2 94%. There is no height or weight on file to calculate BMI.  Past Psychiatric History: History of 4 inpatient psychiatric admissions (most recent a couple of months ago at [facility]).    Patient reports that he was discharged from [facility] a couple of months ago after a 3-4 day stay for his alcohol use. He reports he may have been started on zoloft  while he was there but is unsure. He reports he felt like naltrexone  didn't make him feel good and he slept well with trazodone . He reports after that he followed up with [facility] once and was prescribed seroquel  but he felt like it wasn't helpful so he stopped. He denies current outpatient psychiatrist/therapist. He denies history of suicide attempt (per chart review was last brought into [facility] ED under IVC for suicide attempt when he slit his wrist and had  similar admission and discharge from [facility] in 03/2023). He reports history of self-harm. He reports only past psychiatric diagnosis of alcohol use disorder. He reports a family history of alcoholism in his father.   Is the patient at risk to self? No  Has the patient been a risk to self in the past 6 months? No .    Has the patient been a risk to self within the distant past? Yes   Is the patient a risk to others? No   Has the patient been a risk to others in the past 6 months? No   Has the patient been a risk to others within the distant past? 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 He reports that they have been married for 40 years. He reports she is his main source of support. He denies legal issues, denies access to firearms.   Last Labs:  Admission on 05/10/2024  Component Date Value Ref Range Status   POC Amphetamine UR 05/11/2024 None Detected  NONE DETECTED (Cut Off Level 1000 ng/mL) Final   POC Secobarbital (BAR) 05/11/2024 None Detected  NONE DETECTED (Cut Off Level 300 ng/mL) Final   POC Buprenorphine (BUP) 05/11/2024 None Detected  NONE DETECTED (Cut Off Level 10 ng/mL) Final   POC Oxazepam (BZO) 05/11/2024 Positive (A)  NONE DETECTED (Cut Off Level 300 ng/mL) Final   POC Cocaine UR 05/11/2024 None Detected  NONE DETECTED (Cut Off Level 300 ng/mL) Final   POC Methamphetamine UR 05/11/2024  None Detected  NONE DETECTED (Cut Off Level 1000 ng/mL) Final   POC Morphine  05/11/2024 None Detected  NONE DETECTED (Cut Off Level 300 ng/mL) Final   POC Methadone UR 05/11/2024 None Detected  NONE DETECTED (Cut Off Level 300 ng/mL) Final   POC Oxycodone  UR 05/11/2024 None Detected  NONE DETECTED (Cut Off Level 100 ng/mL) Final   POC Marijuana UR 05/11/2024 Positive (A)  NONE DETECTED (Cut Off Level 50 ng/mL) Final  Admission on 05/09/2024, Discharged on 05/10/2024  Component Date Value Ref Range Status   WBC 05/09/2024 9.8  4.0 - 10.5 K/uL Final   RBC 05/09/2024 4.67  4.22 - 5.81 MIL/uL Final   Hemoglobin 05/09/2024 13.5  13.0 - 17.0 g/dL Final   HCT 93/75/7974 41.9  39.0 - 52.0 % Final   MCV 05/09/2024 89.7  80.0 - 100.0 fL Final   MCH 05/09/2024 28.9  26.0 - 34.0 pg Final   MCHC 05/09/2024 32.2  30.0 - 36.0 g/dL Final   RDW 93/75/7974 17.6 (H)  11.5 - 15.5 % Final   Platelets 05/09/2024 180  150 - 400 K/uL Final   nRBC 05/09/2024 0.0  0.0 - 0.2 % Final   Performed at [facility], [address] [place], [place] [address]   Sodium 05/09/2024 142  135 - 145 mmol/L Final   Potassium 05/09/2024 4.3  3.5 - 5.1 mmol/L Final   Chloride 05/09/2024 107  98 - 111 mmol/L Final   CO2 05/09/2024 26  22 - 32 mmol/L Final   Glucose, Bld 05/09/2024 95  70 - 99 mg/dL Final   Glucose reference range applies only to samples taken after fasting for at least 8 hours.   BUN 05/09/2024 15  8 - 23 mg/dL Final   Creatinine, Ser 05/09/2024 0.96  0.61 - 1.24 mg/dL Final   Calcium  05/09/2024 9.0  8.9 - 10.3 mg/dL Final   Total Protein 93/75/7974 7.3  6.5 - 8.1 g/dL Final   Albumin  05/09/2024 3.9  3.5 - 5.0 g/dL Final   AST 93/75/7974 19  15 - 41 U/L Final   ALT 05/09/2024 14  0 - 44 U/L Final   Alkaline Phosphatase 05/09/2024 65  38 - 126 U/L Final   Total Bilirubin 05/09/2024 0.8  0.0 - 1.2 mg/dL Final   GFR, Estimated 05/09/2024 >60  >60 mL/min Final   Comment: (NOTE) Calculated using the  CKD-EPI Creatinine Equation (2021)    Anion gap 05/09/2024 9  5 - 15 Final   Performed at [facility], [address] [place], [place] [address]   Alcohol, Ethyl (B) 05/09/2024 366 (HH)  <15 mg/dL Final   Comment: CRITICAL RESULT CALLED TO, READ BACK BY AND VERIFIED WITH ANNIE SMITH 05/09/24 1509 MU (NOTE) For medical purposes only. Performed at [facility], [address] [place], [place] [address]    Salicylate Lvl 05/09/2024 <7.0 (L)  7.0 - 30.0 mg/dL Final   Performed at [facility], [address] [place], [place] [address]   Acetaminophen  (Tylenol ), Serum 05/09/2024 <10 (L)  10 - 30 ug/mL Final   Comment: (NOTE) Therapeutic concentrations vary significantly. A range of 10-30 ug/mL  may be an effective concentration for many patients. However, some  are best treated at concentrations outside of this range. Acetaminophen  concentrations >150 ug/mL at 4 hours after ingestion  and >50 ug/mL at 12 hours after ingestion are often associated with  toxic reactions.  Performed at [facility], [address] [place], [place] [address]   Admission on 04/30/2024, Discharged on 04/30/2024  Component Date Value Ref Range Status   Sodium 04/30/2024 141  135 - 145 mmol/L Final   Potassium 04/30/2024 4.6  3.5 - 5.1 mmol/L Final   Chloride 04/30/2024 103  98 - 111 mmol/L Final   CO2 04/30/2024 25  22 - 32 mmol/L Final   Glucose, Bld 04/30/2024 92  70 - 99 mg/dL Final   Glucose reference range applies only to samples taken after fasting for at least 8 hours.   BUN 04/30/2024 20  8 - 23 mg/dL Final   Creatinine, Ser 04/30/2024 1.02  0.61 - 1.24 mg/dL Final   Calcium  04/30/2024 8.4 (L)  8.9 - 10.3 mg/dL Final   Total Protein 93/84/7974 6.9  6.5 - 8.1 g/dL Final   Albumin  04/30/2024 3.9  3.5 - 5.0 g/dL Final   AST 93/84/7974 21  15 - 41 U/L Final   ALT 04/30/2024 16  0 - 44 U/L Final   Alkaline Phosphatase 04/30/2024 69  38 - 126 U/L Final    Total Bilirubin 04/30/2024 0.9  0.0 - 1.2 mg/dL Final   GFR, Estimated 04/30/2024 >60  >60 mL/min Final   Comment: (NOTE) Calculated using the CKD-EPI Creatinine Equation (2021)    Anion gap 04/30/2024 13  5 - 15 Final   Performed at [facility], [address] [place], [place] [address]   Troponin I (High Sensitivity) 04/30/2024 4  <18 ng/L Final   Comment: (NOTE) Elevated high sensitivity troponin I (hsTnI) values and significant  changes across serial measurements may suggest ACS but many other  chronic and acute conditions are known to elevate hsTnI results.  Refer to the Links section for chest pain algorithms and additional  guidance. Performed at [facility], [address] [place], [place] [address]    WBC 04/30/2024 8.4  4.0 - 10.5 K/uL Final   RBC 04/30/2024 4.76  4.22 - 5.81 MIL/uL Final   Hemoglobin 04/30/2024 13.7  13.0 - 17.0 g/dL Final   HCT 93/84/7974 41.9  39.0 - 52.0 % Final   MCV 04/30/2024 88.0  80.0 - 100.0 fL Final   MCH 04/30/2024 28.8  26.0 - 34.0 pg Final   MCHC 04/30/2024 32.7  30.0 - 36.0 g/dL Final   RDW 93/84/7974 17.8 (H)  11.5 - 15.5 % Final   Platelets 04/30/2024 211  150 - 400 K/uL Final   nRBC 04/30/2024 0.0  0.0 - 0.2 % Final   Neutrophils Relative % 04/30/2024 64  % Final   Neutro Abs 04/30/2024 5.4  1.7 - 7.7 K/uL Final   Lymphocytes Relative 04/30/2024 22  % Final   Lymphs Abs 04/30/2024 1.8  0.7 - 4.0 K/uL Final   Monocytes Relative 04/30/2024 10  % Final   Monocytes Absolute 04/30/2024 0.8  0.1 - 1.0 K/uL Final   Eosinophils Relative 04/30/2024 3  % Final   Eosinophils Absolute 04/30/2024 0.2  0.0 - 0.5 K/uL Final   Basophils Relative 04/30/2024 1  % Final   Basophils Absolute 04/30/2024 0.1  0.0 - 0.1 K/uL Final   Immature Granulocytes 04/30/2024 0  % Final   Abs Immature Granulocytes 04/30/2024 0.03  0.00 - 0.07 K/uL Final   Performed at [facility], [address] [place], [place] [address]   Admission on 04/21/2024, Discharged on 04/21/2024  Component Date Value Ref Range Status   WBC 04/21/2024 6.3  4.0 - 10.5 K/uL Final   RBC 04/21/2024 4.89  4.22 - 5.81 MIL/uL Final   Hemoglobin 04/21/2024 14.0  13.0 - 17.0 g/dL Final   HCT 93/93/7974 43.3  39.0 - 52.0 % Final   MCV 04/21/2024 88.5  80.0 - 100.0 fL Final   MCH 04/21/2024 28.6  26.0 - 34.0 pg Final   MCHC 04/21/2024 32.3  30.0 - 36.0 g/dL Final   RDW 93/93/7974 17.3 (H)  11.5 - 15.5 % Final   Platelets 04/21/2024 247  150 - 400 K/uL Final   nRBC 04/21/2024 0.0  0.0 - 0.2 % Final   Neutrophils Relative % 04/21/2024 51  % Final   Neutro Abs 04/21/2024 3.2  1.7 - 7.7 K/uL Final   Lymphocytes Relative 04/21/2024 34  % Final   Lymphs Abs 04/21/2024 2.1  0.7 - 4.0 K/uL Final   Monocytes Relative 04/21/2024 9  % Final   Monocytes Absolute 04/21/2024 0.6  0.1 - 1.0 K/uL Final   Eosinophils Relative 04/21/2024 4  % Final   Eosinophils Absolute 04/21/2024 0.3  0.0 - 0.5 K/uL Final   Basophils Relative 04/21/2024 1  % Final   Basophils Absolute 04/21/2024 0.0  0.0 - 0.1 K/uL Final   Immature Granulocytes 04/21/2024 1  % Final   Abs Immature Granulocytes 04/21/2024 0.04  0.00 - 0.07 K/uL Final   Performed at [facility], [address] [place], [place] [address]   Sodium 04/21/2024 144  135 - 145 mmol/L Final   Potassium 04/21/2024 3.6  3.5 - 5.1 mmol/L Final   Chloride 04/21/2024 107  98 - 111 mmol/L Final   CO2 04/21/2024 24  22 - 32 mmol/L Final   Glucose, Bld 04/21/2024 121 (H)  70 - 99 mg/dL Final   Glucose reference range applies only to samples taken after fasting for at least 8 hours.   BUN 04/21/2024 10  8 - 23 mg/dL Final   Creatinine, Ser 04/21/2024 1.06  0.61 - 1.24 mg/dL Final   Calcium  04/21/2024 9.5  8.9 - 10.3 mg/dL Final   Total Protein 93/93/7974 7.4  6.5 - 8.1 g/dL Final   Albumin  04/21/2024 4.2  3.5 - 5.0 g/dL Final   AST 93/93/7974 17  15 - 41 U/L Final   ALT 04/21/2024 15  0 - 44  U/L Final    Alkaline Phosphatase 04/21/2024 68  38 - 126 U/L Final   Total Bilirubin 04/21/2024 0.6  0.0 - 1.2 mg/dL Final   GFR, Estimated 04/21/2024 >60  >60 mL/min Final   Comment: (NOTE) Calculated using the CKD-EPI Creatinine Equation (2021)    Anion gap 04/21/2024 13  5 - 15 Final   Performed at [facility], [address] [place], [place] [address]   Alcohol, Ethyl (B) 04/21/2024 371 (HH)  <15 mg/dL Final   Comment: CRITICAL RESULT CALLED TO, READ BACK BY AND VERIFIED WITH ERIKA TRUJILLO AT 9268 04/21/24.PMF (NOTE) For medical purposes only. Performed at [facility], [address] [place], [place] [address]    Total CK 04/21/2024 28 (L)  49 - 397 U/L Final   Performed at [facility], [address] [place], [place] [address]  Admission on 04/11/2024, Discharged on 04/12/2024  Component Date Value Ref Range Status   Sodium 04/11/2024 141  135 - 145 mmol/L Final   Potassium 04/11/2024 4.2  3.5 - 5.1 mmol/L Final   Chloride 04/11/2024 107  98 - 111 mmol/L Final   CO2 04/11/2024 25  22 - 32 mmol/L Final   Glucose, Bld 04/11/2024 115 (H)  70 - 99 mg/dL Final   Glucose reference range applies only to samples taken after fasting for at least 8 hours.   BUN 04/11/2024 12  8 - 23 mg/dL Final   Creatinine, Ser 04/11/2024 1.10  0.61 - 1.24 mg/dL Final   Calcium  04/11/2024 8.6 (L)  8.9 - 10.3 mg/dL Final   Total Protein 94/72/7974 6.6  6.5 - 8.1 g/dL Final   Albumin  04/11/2024 3.7  3.5 - 5.0 g/dL Final   AST 94/72/7974 26  15 - 41 U/L Final   ALT 04/11/2024 <5  0 - 44 U/L Final   Alkaline Phosphatase 04/11/2024 53  38 - 126 U/L Final   Total Bilirubin 04/11/2024 0.8  0.0 - 1.2 mg/dL Final   GFR, Estimated 04/11/2024 >60  >60 mL/min Final   Comment: (NOTE) Calculated using the CKD-EPI Creatinine Equation (2021)    Anion gap 04/11/2024 9  5 - 15 Final   Performed at [facility], [address] [place], [place] [address]   Sodium 04/12/2024 144   135 - 145 mmol/L Final   Potassium 04/12/2024 3.8  3.5 - 5.1 mmol/L Final   Chloride 04/12/2024 107  98 - 111 mmol/L Final   CO2 04/12/2024 25  22 - 32 mmol/L Final   Glucose, Bld 04/12/2024 86  70 - 99 mg/dL Final   Glucose reference range applies only to samples taken after fasting for at least 8 hours.   BUN 04/12/2024 13  8 - 23 mg/dL Final   Creatinine, Ser 04/12/2024 0.97  0.61 - 1.24 mg/dL Final   Calcium  04/12/2024 8.7 (L)  8.9 - 10.3 mg/dL Final   GFR, Estimated 04/12/2024 >60  >60 mL/min Final   Comment: (NOTE) Calculated using the CKD-EPI Creatinine Equation (2021)    Anion gap 04/12/2024 12  5 - 15 Final   Performed at [facility], [address] [place], [place] [address]   WBC 04/11/2024 7.2  4.0 - 10.5 K/uL Final   RBC 04/11/2024 4.57  4.22 - 5.81 MIL/uL Final   Hemoglobin 04/11/2024 12.8 (L)  13.0 - 17.0 g/dL Final   HCT 94/72/7974 40.4  39.0 - 52.0 % Final   MCV 04/11/2024 88.4  80.0 - 100.0 fL Final  MCH 04/11/2024 28.0  26.0 - 34.0 pg Final   MCHC 04/11/2024 31.7  30.0 - 36.0 g/dL Final   RDW 94/72/7974 16.2 (H)  11.5 - 15.5 % Final   Platelets 04/11/2024 198  150 - 400 K/uL Final   nRBC 04/11/2024 0.0  0.0 - 0.2 % Final   Neutrophils Relative % 04/11/2024 74  % Final   Neutro Abs 04/11/2024 5.3  1.7 - 7.7 K/uL Final   Lymphocytes Relative 04/11/2024 16  % Final   Lymphs Abs 04/11/2024 1.2  0.7 - 4.0 K/uL Final   Monocytes Relative 04/11/2024 6  % Final   Monocytes Absolute 04/11/2024 0.4  0.1 - 1.0 K/uL Final   Eosinophils Relative 04/11/2024 2  % Final   Eosinophils Absolute 04/11/2024 0.2  0.0 - 0.5 K/uL Final   Basophils Relative 04/11/2024 1  % Final   Basophils Absolute 04/11/2024 0.1  0.0 - 0.1 K/uL Final   Immature Granulocytes 04/11/2024 1  % Final   Abs Immature Granulocytes 04/11/2024 0.04  0.00 - 0.07 K/uL Final   Performed at [facility], [address] [place], [place] [address]   Alcohol, Ethyl (B) 04/11/2024 299 (H)   <15 mg/dL Final   Comment: (NOTE) For medical purposes only. Performed at [facility], [address] [place], [place] [address]    Magnesium  04/11/2024 2.0  1.7 - 2.4 mg/dL Final   Performed at [facility], [address] [place], [place] [address]  Admission on 01/13/2024, Discharged on 01/13/2024  Component Date Value Ref Range Status   Sodium 01/13/2024 141  135 - 145 mmol/L Final   Potassium 01/13/2024 4.2  3.5 - 5.1 mmol/L Final   Chloride 01/13/2024 105  98 - 111 mmol/L Final   CO2 01/13/2024 18 (L)  22 - 32 mmol/L Final   Glucose, Bld 01/13/2024 94  70 - 99 mg/dL Final   Glucose reference range applies only to samples taken after fasting for at least 8 hours.   BUN 01/13/2024 11  8 - 23 mg/dL Final   Creatinine, Ser 01/13/2024 1.08  0.61 - 1.24 mg/dL Final   Calcium  01/13/2024 8.9  8.9 - 10.3 mg/dL Final   GFR, Estimated 01/13/2024 >60  >60 mL/min Final   Comment: (NOTE) Calculated using the CKD-EPI Creatinine Equation (2021)    Anion gap 01/13/2024 18 (H)  5 - 15 Final   Performed at [facility], [address] [place], [place] [address]   WBC 01/13/2024 7.1  4.0 - 10.5 K/uL Final   RBC 01/13/2024 5.08  4.22 - 5.81 MIL/uL Final   Hemoglobin 01/13/2024 14.9  13.0 - 17.0 g/dL Final   HCT 97/72/7974 46.1  39.0 - 52.0 % Final   MCV 01/13/2024 90.7  80.0 - 100.0 fL Final   MCH 01/13/2024 29.3  26.0 - 34.0 pg Final   MCHC 01/13/2024 32.3  30.0 - 36.0 g/dL Final   RDW 97/72/7974 16.7 (H)  11.5 - 15.5 % Final   Platelets 01/13/2024 271  150 - 400 K/uL Final   nRBC 01/13/2024 0.0  0.0 - 0.2 % Final   Performed at [facility], [address] [place], [place] [address]   SARS Coronavirus 2 by RT PCR 01/13/2024 NEGATIVE  NEGATIVE Final   Comment: (NOTE) SARS-CoV-2 target nucleic acids are NOT DETECTED.  The SARS-CoV-2 RNA is generally detectable in upper respiratory specimens during the acute phase of infection. The  lowest concentration of SARS-CoV-2 viral copies this  assay can detect is 138 copies/mL. A negative result does not preclude SARS-Cov-2 infection and should not be used as the sole basis for treatment or other patient management decisions. A negative result may occur with  improper specimen collection/handling, submission of specimen other than nasopharyngeal swab, presence of viral mutation(s) within the areas targeted by this assay, and inadequate number of viral copies(<138 copies/mL). A negative result must be combined with clinical observations, patient history, and epidemiological information. The expected result is Negative.  Fact Sheet for Patients:  BloggerCourse.com  Fact Sheet for Healthcare Providers:  SeriousBroker.it  This test is no                          t yet approved or cleared by the [place]  FDA and  has been authorized for detection and/or diagnosis of SARS-CoV-2 by FDA under an Emergency Use Authorization (EUA). This EUA will remain  in effect (meaning this test can be used) for the duration of the COVID-19 declaration under Section 564(b)(1) of the Act, 21 U.S.C.section 360bbb-3(b)(1), unless the authorization is terminated  or revoked sooner.       Influenza A by PCR 01/13/2024 NEGATIVE  NEGATIVE Final   Influenza B by PCR 01/13/2024 NEGATIVE  NEGATIVE Final   Comment: (NOTE) The Xpert Xpress SARS-CoV-2/FLU/RSV plus assay is intended as an aid in the diagnosis of influenza from Nasopharyngeal swab specimens and should not be used as a sole basis for treatment. Nasal washings and aspirates are unacceptable for Xpert Xpress SARS-CoV-2/FLU/RSV testing.  Fact Sheet for Patients: BloggerCourse.com  Fact Sheet for Healthcare Providers: SeriousBroker.it  This test is not yet approved or cleared by the [place]  FDA and has been authorized for  detection and/or diagnosis of SARS-CoV-2 by FDA under an Emergency Use Authorization (EUA). This EUA will remain in effect (meaning this test can be used) for the duration of the COVID-19 declaration under Section 564(b)(1) of the Act, 21 U.S.C. section 360bbb-3(b)(1), unless the authorization is terminated or revoked.     Resp Syncytial Virus by PCR 01/13/2024 NEGATIVE  NEGATIVE Final   Comment: (NOTE) Fact Sheet for Patients: BloggerCourse.com  Fact Sheet for Healthcare Providers: SeriousBroker.it  This test is not yet approved or cleared by the [place]  FDA and has been authorized for detection and/or diagnosis of SARS-CoV-2 by FDA under an Emergency Use Authorization (EUA). This EUA will remain in effect (meaning this test can be used) for the duration of the COVID-19 declaration under Section 564(b)(1) of the Act, 21 U.S.C. section 360bbb-3(b)(1), unless the authorization is terminated or revoked.  Performed at [facility], [address] [place], [place] [address]    Troponin I (High Sensitivity) 01/13/2024 4  <18 ng/L Final   Comment: (NOTE) Elevated high sensitivity troponin I (hsTnI) values and significant  changes across serial measurements may suggest ACS but many other  chronic and acute conditions are known to elevate hsTnI results.  Refer to the Links section for chest pain algorithms and additional  guidance. Performed at [facility], [address] [place], [place] [address]    Alcohol, Ethyl (B) 01/13/2024 293 (H)  <10 mg/dL Final   Comment: (NOTE) Lowest detectable limit for serum alcohol is 10 mg/dL.  For medical purposes only. Performed at [facility], [address], [place], [place] [address]   Admission on 01/08/2024, Discharged on 01/08/2024  Component Date Value Ref Range Status   SARS Coronavirus 2 by RT  PCR 01/08/2024 NEGATIVE  NEGATIVE Final   Comment:  (NOTE) SARS-CoV-2 target nucleic acids are NOT DETECTED.  The SARS-CoV-2 RNA is generally detectable in upper respiratory specimens during the acute phase of infection. The lowest concentration of SARS-CoV-2 viral copies this assay can detect is 138 copies/mL. A negative result does not preclude SARS-Cov-2 infection and should not be used as the sole basis for treatment or other patient management decisions. A negative result may occur with  improper specimen collection/handling, submission of specimen other than nasopharyngeal swab, presence of viral mutation(s) within the areas targeted by this assay, and inadequate number of viral copies(<138 copies/mL). A negative result must be combined with clinical observations, patient history, and epidemiological information. The expected result is Negative.  Fact Sheet for Patients:  BloggerCourse.com  Fact Sheet for Healthcare Providers:  SeriousBroker.it  This test is no                          t yet approved or cleared by the [place]  FDA and  has been authorized for detection and/or diagnosis of SARS-CoV-2 by FDA under an Emergency Use Authorization (EUA). This EUA will remain  in effect (meaning this test can be used) for the duration of the COVID-19 declaration under Section 564(b)(1) of the Act, 21 U.S.C.section 360bbb-3(b)(1), unless the authorization is terminated  or revoked sooner.       Influenza A by PCR 01/08/2024 POSITIVE (A)  NEGATIVE Final   Influenza B by PCR 01/08/2024 NEGATIVE  NEGATIVE Final   Comment: (NOTE) The Xpert Xpress SARS-CoV-2/FLU/RSV plus assay is intended as an aid in the diagnosis of influenza from Nasopharyngeal swab specimens and should not be used as a sole basis for treatment. Nasal washings and aspirates are unacceptable for Xpert Xpress SARS-CoV-2/FLU/RSV testing.  Fact Sheet for  Patients: BloggerCourse.com  Fact Sheet for Healthcare Providers: SeriousBroker.it  This test is not yet approved or cleared by the [place]  FDA and has been authorized for detection and/or diagnosis of SARS-CoV-2 by FDA under an Emergency Use Authorization (EUA). This EUA will remain in effect (meaning this test can be used) for the duration of the COVID-19 declaration under Section 564(b)(1) of the Act, 21 U.S.C. section 360bbb-3(b)(1), unless the authorization is terminated or revoked.     Resp Syncytial Virus by PCR 01/08/2024 NEGATIVE  NEGATIVE Final   Comment: (NOTE) Fact Sheet for Patients: BloggerCourse.com  Fact Sheet for Healthcare Providers: SeriousBroker.it  This test is not yet approved or cleared by the [place]  FDA and has been authorized for detection and/or diagnosis of SARS-CoV-2 by FDA under an Emergency Use Authorization (EUA). This EUA will remain in effect (meaning this test can be used) for the duration of the COVID-19 declaration under Section 564(b)(1) of the Act, 21 U.S.C. section 360bbb-3(b)(1), unless the authorization is terminated or revoked.  Performed at [facility], [address] [place], [place] [address]    Sodium 01/08/2024 138  135 - 145 mmol/L Final   Potassium 01/08/2024 4.2  3.5 - 5.1 mmol/L Final   Chloride 01/08/2024 103  98 - 111 mmol/L Final   CO2 01/08/2024 21 (L)  22 - 32 mmol/L Final   Glucose, Bld 01/08/2024 95  70 - 99 mg/dL Final   Glucose reference range applies only to samples taken after fasting for at least 8 hours.   BUN 01/08/2024 12  8 - 23 mg/dL Final   Creatinine, Ser 01/08/2024 1.03  0.61 -  1.24 mg/dL Final   Calcium  01/08/2024 8.9  8.9 - 10.3 mg/dL Final   Total Protein 97/77/7974 6.9  6.5 - 8.1 g/dL Final   Albumin  01/08/2024 3.8  3.5 - 5.0 g/dL Final   AST 97/77/7974 23  15 - 41 U/L Final    ALT 01/08/2024 17  0 - 44 U/L Final   Alkaline Phosphatase 01/08/2024 68  38 - 126 U/L Final   Total Bilirubin 01/08/2024 0.6  0.0 - 1.2 mg/dL Final   GFR, Estimated 01/08/2024 >60  >60 mL/min Final   Comment: (NOTE) Calculated using the CKD-EPI Creatinine Equation (2021)    Anion gap 01/08/2024 14  5 - 15 Final   Performed at [facility], [address] [place], [place] [address]   Lipase 01/08/2024 37  11 - 51 U/L Final   Performed at [facility], [address] [place], [place] [address]   WBC 01/08/2024 5.2  4.0 - 10.5 K/uL Final   RBC 01/08/2024 4.62  4.22 - 5.81 MIL/uL Final   Hemoglobin 01/08/2024 13.5  13.0 - 17.0 g/dL Final   HCT 97/77/7974 41.9  39.0 - 52.0 % Final   MCV 01/08/2024 90.7  80.0 - 100.0 fL Final   MCH 01/08/2024 29.2  26.0 - 34.0 pg Final   MCHC 01/08/2024 32.2  30.0 - 36.0 g/dL Final   RDW 97/77/7974 16.7 (H)  11.5 - 15.5 % Final   Platelets 01/08/2024 214  150 - 400 K/uL Final   nRBC 01/08/2024 0.0  0.0 - 0.2 % Final   Neutrophils Relative % 01/08/2024 47  % Final   Neutro Abs 01/08/2024 2.4  1.7 - 7.7 K/uL Final   Lymphocytes Relative 01/08/2024 30  % Final   Lymphs Abs 01/08/2024 1.6  0.7 - 4.0 K/uL Final   Monocytes Relative 01/08/2024 19  % Final   Monocytes Absolute 01/08/2024 1.0  0.1 - 1.0 K/uL Final   Eosinophils Relative 01/08/2024 3  % Final   Eosinophils Absolute 01/08/2024 0.2  0.0 - 0.5 K/uL Final   Basophils Relative 01/08/2024 1  % Final   Basophils Absolute 01/08/2024 0.1  0.0 - 0.1 K/uL Final   Immature Granulocytes 01/08/2024 0  % Final   Abs Immature Granulocytes 01/08/2024 0.02  0.00 - 0.07 K/uL Final   Performed at [facility], [address] [place], [place] [address]  Admission on 12/23/2023, Discharged on 12/23/2023  Component Date Value Ref Range Status   Sodium 12/23/2023 141  135 - 145 mmol/L Final   Potassium 12/23/2023 3.9  3.5 - 5.1 mmol/L Final   Chloride 12/23/2023 105  98 - 111 mmol/L  Final   CO2 12/23/2023 20 (L)  22 - 32 mmol/L Final   Glucose, Bld 12/23/2023 105 (H)  70 - 99 mg/dL Final   Glucose reference range applies only to samples taken after fasting for at least 8 hours.   BUN 12/23/2023 12  8 - 23 mg/dL Final   Creatinine, Ser 12/23/2023 1.04  0.61 - 1.24 mg/dL Final   Calcium  12/23/2023 8.5 (L)  8.9 - 10.3 mg/dL Final   Total Protein 97/93/7974 7.2  6.5 - 8.1 g/dL Final   Albumin  12/23/2023 4.1  3.5 - 5.0 g/dL Final   AST 97/93/7974 24  15 - 41 U/L Final   ALT 12/23/2023 24  0 - 44 U/L Final   Alkaline Phosphatase 12/23/2023 61  38 - 126 U/L Final   Total Bilirubin 12/23/2023 0.8  0.0 -  1.2 mg/dL Final   GFR, Estimated 12/23/2023 >60  >60 mL/min Final   Comment: (NOTE) Calculated using the CKD-EPI Creatinine Equation (2021)    Anion gap 12/23/2023 16 (H)  5 - 15 Final   Performed at [facility], [address] [place], [place] [address]   Alcohol, Ethyl (B) 12/23/2023 269 (H)  <10 mg/dL Final   Comment: (NOTE) Lowest detectable limit for serum alcohol is 10 mg/dL.  For medical purposes only. Performed at [facility], [address] [place], [place] [address]    Salicylate Lvl 12/23/2023 <7.0 (L)  7.0 - 30.0 mg/dL Final   Performed at [facility], [address] [place], [place] [address]   Acetaminophen  (Tylenol ), Serum 12/23/2023 <10 (L)  10 - 30 ug/mL Final   Comment: (NOTE) Therapeutic concentrations vary significantly. A range of 10-30 ug/mL  may be an effective concentration for many patients. However, some  are best treated at concentrations outside of this range. Acetaminophen  concentrations >150 ug/mL at 4 hours after ingestion  and >50 ug/mL at 12 hours after ingestion are often associated with  toxic reactions.  Performed at [facility], [address] [place], [place] [address]    WBC 12/23/2023 7.4  4.0 - 10.5 K/uL Final   RBC 12/23/2023 4.52  4.22 - 5.81 MIL/uL Final   Hemoglobin  12/23/2023 12.8 (L)  13.0 - 17.0 g/dL Final   HCT 97/93/7974 38.7 (L)  39.0 - 52.0 % Final   MCV 12/23/2023 85.6  80.0 - 100.0 fL Final   MCH 12/23/2023 28.3  26.0 - 34.0 pg Final   MCHC 12/23/2023 33.1  30.0 - 36.0 g/dL Final   RDW 97/93/7974 17.5 (H)  11.5 - 15.5 % Final   Platelets 12/23/2023 326  150 - 400 K/uL Final   nRBC 12/23/2023 0.0  0.0 - 0.2 % Final   Performed at [facility], [address] [place], [place] [address]   Tricyclic, Ur Screen 12/23/2023 NONE DETECTED  NONE DETECTED Final   Amphetamines, Ur Screen 12/23/2023 NONE DETECTED  NONE DETECTED Final   MDMA (Ecstasy)Ur Screen 12/23/2023 NONE DETECTED  NONE DETECTED Final   Cocaine Metabolite,Ur [place] 12/23/2023 NONE DETECTED  NONE DETECTED Final   Opiate, Ur Screen 12/23/2023 NONE DETECTED  NONE DETECTED Final   Phencyclidine (PCP) Ur S 12/23/2023 NONE DETECTED  NONE DETECTED Final   Cannabinoid 50 Ng, Ur [place] 12/23/2023 POSITIVE (A)  NONE DETECTED Final   Barbiturates, Ur Screen 12/23/2023 POSITIVE (A)  NONE DETECTED Final   Benzodiazepine, Ur Scrn 12/23/2023 NONE DETECTED  NONE DETECTED Final   Methadone Scn, Ur 12/23/2023 NONE DETECTED  NONE DETECTED Final   Comment: (NOTE) Tricyclics + metabolites, urine    Cutoff 1000 ng/mL Amphetamines + metabolites, urine  Cutoff 1000 ng/mL MDMA (Ecstasy), urine              Cutoff 500 ng/mL Cocaine Metabolite, urine          Cutoff 300 ng/mL Opiate + metabolites, urine        Cutoff 300 ng/mL Phencyclidine (PCP), urine         Cutoff 25 ng/mL Cannabinoid, urine                 Cutoff 50 ng/mL Barbiturates + metabolites, urine  Cutoff 200 ng/mL Benzodiazepine, urine              Cutoff 200 ng/mL Methadone, urine  Cutoff 300 ng/mL  The urine drug screen provides only a preliminary, unconfirmed analytical test result and should not be used for non-medical purposes. Clinical consideration and professional judgment should be applied to any positive drug  screen result due to possible interfering substances. A more specific alternate chemical method must be used in order to obtain a confirmed analytical result. Gas chromatography / mass spectrometry (GC/MS) is the preferred confirm                          atory method. Performed at [facility], [address] [place], [place] [address]    Troponin I (High Sensitivity) 12/23/2023 5  <18 ng/L Final   Comment: (NOTE) Elevated high sensitivity troponin I (hsTnI) values and significant  changes across serial measurements may suggest ACS but many other  chronic and acute conditions are known to elevate hsTnI results.  Refer to the Links section for chest pain algorithms and additional  guidance. Performed at [facility], [address] [place], [place] [address]    SARS Coronavirus 2 by RT PCR 12/23/2023 NEGATIVE  NEGATIVE Final   Comment: (NOTE) SARS-CoV-2 target nucleic acids are NOT DETECTED.  The SARS-CoV-2 RNA is generally detectable in upper respiratory specimens during the acute phase of infection. The lowest concentration of SARS-CoV-2 viral copies this assay can detect is 138 copies/mL. A negative result does not preclude SARS-Cov-2 infection and should not be used as the sole basis for treatment or other patient management decisions. A negative result may occur with  improper specimen collection/handling, submission of specimen other than nasopharyngeal swab, presence of viral mutation(s) within the areas targeted by this assay, and inadequate number of viral copies(<138 copies/mL). A negative result must be combined with clinical observations, patient history, and epidemiological information. The expected result is Negative.  Fact Sheet for Patients:  BloggerCourse.com  Fact Sheet for Healthcare Providers:  SeriousBroker.it  This test is no                          t yet approved or cleared by  the [place]  FDA and  has been authorized for detection and/or diagnosis of SARS-CoV-2 by FDA under an Emergency Use Authorization (EUA). This EUA will remain  in effect (meaning this test can be used) for the duration of the COVID-19 declaration under Section 564(b)(1) of the Act, 21 U.S.C.section 360bbb-3(b)(1), unless the authorization is terminated  or revoked sooner.       Influenza A by PCR 12/23/2023 NEGATIVE  NEGATIVE Final   Influenza B by PCR 12/23/2023 NEGATIVE  NEGATIVE Final   Comment: (NOTE) The Xpert Xpress SARS-CoV-2/FLU/RSV plus assay is intended as an aid in the diagnosis of influenza from Nasopharyngeal swab specimens and should not be used as a sole basis for treatment. Nasal washings and aspirates are unacceptable for Xpert Xpress SARS-CoV-2/FLU/RSV testing.  Fact Sheet for Patients: BloggerCourse.com  Fact Sheet for Healthcare Providers: SeriousBroker.it  This test is not yet approved or cleared by the [place]  FDA and has been authorized for detection and/or diagnosis of SARS-CoV-2 by FDA under an Emergency Use Authorization (EUA). This EUA will remain in effect (meaning this test can be used) for the duration of the COVID-19 declaration under Section 564(b)(1) of the Act, 21 U.S.C. section 360bbb-3(b)(1), unless the authorization is terminated or revoked.     Resp Syncytial Virus by PCR 12/23/2023 NEGATIVE  NEGATIVE Final  Comment: (NOTE) Fact Sheet for Patients: BloggerCourse.com  Fact Sheet for Healthcare Providers: SeriousBroker.it  This test is not yet approved or cleared by the [place]  FDA and has been authorized for detection and/or diagnosis of SARS-CoV-2 by FDA under an Emergency Use Authorization (EUA). This EUA will remain in effect (meaning this test can be used) for the duration of the COVID-19 declaration under Section  564(b)(1) of the Act, 21 U.S.C. section 360bbb-3(b)(1), unless the authorization is terminated or revoked.  Performed at [facility], [address] [place], [place] [address]    Alcohol, Ethyl (B) 12/23/2023 93 (H)  <10 mg/dL Final   Comment: (NOTE) Lowest detectable limit for serum alcohol is 10 mg/dL.  For medical purposes only. Performed at [facility], [address] [place], [place] [address]   Admission on 12/07/2023, Discharged on 12/08/2023  Component Date Value Ref Range Status   Sodium 12/07/2023 132 (L)  135 - 145 mmol/L Final   Potassium 12/07/2023 3.3 (L)  3.5 - 5.1 mmol/L Final   Chloride 12/07/2023 102  98 - 111 mmol/L Final   CO2 12/07/2023 18 (L)  22 - 32 mmol/L Final   Glucose, Bld 12/07/2023 115 (H)  70 - 99 mg/dL Final   Glucose reference range applies only to samples taken after fasting for at least 8 hours.   BUN 12/07/2023 16  8 - 23 mg/dL Final   Creatinine, Ser 12/07/2023 0.91  0.61 - 1.24 mg/dL Final   Calcium  12/07/2023 8.9  8.9 - 10.3 mg/dL Final   Total Protein 98/78/7974 7.7  6.5 - 8.1 g/dL Final   Albumin  12/07/2023 4.1  3.5 - 5.0 g/dL Final   AST 98/78/7974 13 (L)  15 - 41 U/L Final   ALT 12/07/2023 14  0 - 44 U/L Final   Alkaline Phosphatase 12/07/2023 73  38 - 126 U/L Final   Total Bilirubin 12/07/2023 0.5  0.0 - 1.2 mg/dL Final   GFR, Estimated 12/07/2023 >60  >60 mL/min Final   Comment: (NOTE) Calculated using the CKD-EPI Creatinine Equation (2021)    Anion gap 12/07/2023 12  5 - 15 Final   Performed at [facility], [address] [place], [place] [address]   Alcohol, Ethyl (B) 12/07/2023 314 (HH)  <10 mg/dL Final   Comment: CRITICAL RESULT CALLED TO, READ BACK BY AND VERIFIED WITH K. SUMMERS, RN AT 2354 01.21.25 JLASIGAN (NOTE) Lowest detectable limit for serum alcohol is 10 mg/dL.  For medical purposes only. Performed at [facility], [address] [place], [place] [address]    Salicylate  Lvl 12/07/2023 <7.0 (L)  7.0 - 30.0 mg/dL Final   Performed at [facility], [address] [place], [place] [address]   Acetaminophen  (Tylenol ), Serum 12/07/2023 <10 (L)  10 - 30 ug/mL Final   Comment: (NOTE) Therapeutic concentrations vary significantly. A range of 10-30 ug/mL  may be an effective concentration for many patients. However, some  are best treated at concentrations outside of this range. Acetaminophen  concentrations >150 ug/mL at 4 hours after ingestion  and >50 ug/mL at 12 hours after ingestion are often associated with  toxic reactions.  Performed at [facility], [address], [place], [place] [address]    WBC 12/07/2023 8.2  4.0 - 10.5 K/uL Final   RBC 12/07/2023 4.83  4.22 - 5.81 MIL/uL Final   Hemoglobin 12/07/2023 13.9  13.0 - 17.0 g/dL Final   HCT 98/78/7974 43.0  39.0 -  52.0 % Final   MCV 12/07/2023 89.0  80.0 - 100.0 fL Final   MCH 12/07/2023 28.8  26.0 - 34.0 pg Final   MCHC 12/07/2023 32.3  30.0 - 36.0 g/dL Final   RDW 98/78/7974 18.3 (H)  11.5 - 15.5 % Final   Platelets 12/07/2023 269  150 - 400 K/uL Final   nRBC 12/07/2023 0.0  0.0 - 0.2 % Final   Performed at [facility], [address] [place], [place] [address]   Tricyclic, Ur Screen 12/07/2023 NONE DETECTED  NONE DETECTED Final   Amphetamines, Ur Screen 12/07/2023 NONE DETECTED  NONE DETECTED Final   MDMA (Ecstasy)Ur Screen 12/07/2023 NONE DETECTED  NONE DETECTED Final   Cocaine Metabolite,Ur [place] 12/07/2023 NONE DETECTED  NONE DETECTED Final   Opiate, Ur Screen 12/07/2023 NONE DETECTED  NONE DETECTED Final   Phencyclidine (PCP) Ur S 12/07/2023 NONE DETECTED  NONE DETECTED Final   Cannabinoid 50 Ng, Ur  12/07/2023 POSITIVE (A)  NONE DETECTED Final   Barbiturates, Ur Screen 12/07/2023 NONE DETECTED  NONE DETECTED Final   Benzodiazepine, Ur Scrn 12/07/2023 POSITIVE (A)  NONE DETECTED Final   Methadone Scn, Ur 12/07/2023 NONE DETECTED  NONE DETECTED Final   Comment:  (NOTE) Tricyclics + metabolites, urine    Cutoff 1000 ng/mL Amphetamines + metabolites, urine  Cutoff 1000 ng/mL MDMA (Ecstasy), urine              Cutoff 500 ng/mL Cocaine Metabolite, urine          Cutoff 300 ng/mL Opiate + metabolites, urine        Cutoff 300 ng/mL Phencyclidine (PCP), urine         Cutoff 25 ng/mL Cannabinoid, urine                 Cutoff 50 ng/mL Barbiturates + metabolites, urine  Cutoff 200 ng/mL Benzodiazepine, urine              Cutoff 200 ng/mL Methadone, urine                   Cutoff 300 ng/mL  The urine drug screen provides only a preliminary, unconfirmed analytical test result and should not be used for non-medical purposes. Clinical consideration and professional judgment should be applied to any positive drug screen result due to possible interfering substances. A more specific alternate chemical method must be used in order to obtain a confirmed analytical result. Gas chromatography / mass spectrometry (GC/MS) is the preferred confirm                          atory method. Performed at [facility], [address] [place], [place] [address]   Admission on 11/15/2023, Discharged on 11/15/2023  Component Date Value Ref Range Status   Sodium 11/14/2023 132 (L)  135 - 145 mmol/L Final   Potassium 11/14/2023 4.4  3.5 - 5.1 mmol/L Final   Chloride 11/14/2023 99  98 - 111 mmol/L Final   CO2 11/14/2023 16 (L)  22 - 32 mmol/L Final   Glucose, Bld 11/14/2023 244 (H)  70 - 99 mg/dL Final   Glucose reference range applies only to samples taken after fasting for at least 8 hours.   BUN 11/14/2023 20  8 - 23 mg/dL Final   Creatinine, Ser 11/14/2023 1.40 (H)  0.61 - 1.24 mg/dL Final   Calcium  11/14/2023 8.6 (L)  8.9 - 10.3 mg/dL Final   GFR, Estimated 11/14/2023  56 (L)  >60 mL/min Final   Comment: (NOTE) Calculated using the CKD-EPI Creatinine Equation (2021)    Anion gap 11/14/2023 17 (H)  5 - 15 Final   Performed at [facility], [address] [place], [place] [address]   WBC 11/14/2023 6.8  4.0 - 10.5 K/uL Final   RBC 11/14/2023 4.28  4.22 - 5.81 MIL/uL Final   Hemoglobin 11/14/2023 12.3 (L)  13.0 - 17.0 g/dL Final   HCT 87/70/7975 38.4 (L)  39.0 - 52.0 % Final   MCV 11/14/2023 89.7  80.0 - 100.0 fL Final   MCH 11/14/2023 28.7  26.0 - 34.0 pg Final   MCHC 11/14/2023 32.0  30.0 - 36.0 g/dL Final   RDW 87/70/7975 20.4 (H)  11.5 - 15.5 % Final   Platelets 11/14/2023 161  150 - 400 K/uL Final   nRBC 11/14/2023 0.0  0.0 - 0.2 % Final   Neutrophils Relative % 11/14/2023 62  % Final   Neutro Abs 11/14/2023 4.3  1.7 - 7.7 K/uL Final   Lymphocytes Relative 11/14/2023 21  % Final   Lymphs Abs 11/14/2023 1.4  0.7 - 4.0 K/uL Final   Monocytes Relative 11/14/2023 13  % Final   Monocytes Absolute 11/14/2023 0.9  0.1 - 1.0 K/uL Final   Eosinophils Relative 11/14/2023 3  % Final   Eosinophils Absolute 11/14/2023 0.2  0.0 - 0.5 K/uL Final   Basophils Relative 11/14/2023 1  % Final   Basophils Absolute 11/14/2023 0.0  0.0 - 0.1 K/uL Final   Immature Granulocytes 11/14/2023 0  % Final   Abs Immature Granulocytes 11/14/2023 0.01  0.00 - 0.07 K/uL Final   Performed at [facility], [address] [place], [place] [address]  There may be more visits with results that are not included.    Allergies: Patient has no known allergies.  Medications:  Facility Ordered Medications  Medication   [COMPLETED] acetaminophen  (TYLENOL ) tablet 1,000 mg   hydrOXYzine  (ATARAX ) tablet 25 mg   lisinopril  (ZESTRIL ) tablet 40 mg   diclofenac  Sodium (VOLTAREN ) 1 % topical gel 4 g   [COMPLETED] thiamine  (VITAMIN B1) injection 100 mg   thiamine  (VITAMIN B1)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

## 2024-05-11 NOTE — ED Notes (Signed)
 Patient calm and cooperative. Patient currently resting in bed. Pt observed/assessed in recliner sleeping. RR even and unlabored, appearing in no noted distress.
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
## 2024-05-11 NOTE — ED Notes (Signed)
 Pt sitting in dayroom watching television and interacting with peers. No acute distress noted. No concerns voiced. Informed pt to notify staff with any needs or assistance. Pt verbalized understanding and agreement. Will continue to monitor for safety.

## 2024-05-11 NOTE — Discharge Instructions (Addendum)
 Please follow-up with [facility] behavioral health services in [place]. Walk in is available at this location and would need to call to find out walk in hours for assessment and their recommendations for SAIOP and medication management services there.    [address] [place], [place] [address] [phone]  Based on the information that you have provided and the presenting issues outpatient services and resources for have been recommended.  It is imperative that you follow through with treatment recommendations within 5-7 days from the of discharge to mitigate further risk to your safety and mental well-being. A list of referrals has been provided below to get you started.  You are not limited to the list provided.  In case of an urgent crisis, you may contact the Mobile Crisis Unit with [facility] at 1.2133892304.     Follow up resources for AA groups are provided below    About A.A. People who think they have a drinking problem are welcome to attend any A.A. meeting. They can sit and listen and learn more about recovery. Or they can share about themselves. It's completely up to them. Everyone is welcome. You Heitor't have to pay anything to attend.   Our members are not professionals on alcoholism or addiction of any kind. We do not provide medical or psychological diagnoses or prognoses. We leave that up to you. A.A. is a supplemental resource. We offer ongoing support for anyone who has a drinking problem based on a solution that works for us .   Meetings are classified as "open" or "closed". Professionals are welcome to observe "open" A.A. meetings. "Open" meetings are for anyone interested in learning more about what happens in A.A. "Closed" meetings are for only those who have a desire to stop drinking. Find an open meeting here.     A.A. Resources   Resources from A.A. in [place]  Contact an A.A. Coordinator for more information: [email or web address] or  [email or web address]. We look forward to hearing from you. [place] A.A. Meetings AA support, including regional helplines, is at the local level. Find local AA information: LodgingShop.[email or web address]       AA Meeting Early [facility]  Location:: Address [address] [place], [place], [address]   Weekly Meeting Schedule MONDAY, 8:00 AM - 9:00 AM Early [place] Discussion Open Virtual Join Early [facility] Online AA Meeting Password: [phone]   TUESDAY, 8:00 AM - 9:00 AM Early [place] Discussion Open Virtual Join Early [facility] Online AA Meeting Password: [phone]   [facility], 8:00 AM - 9:00 AM Early [place] Discussion Open Virtual Join Early [facility] Online AA Meeting Password: [phone]   THURSDAY, 8:00 AM - 9:00 AM Early [place] Discussion Open Virtual Join Early [facility] Online AA Meeting Password: [phone]   FRIDAY, 8:00 AM - 9:00 AM Early [place] Discussion Open Virtual Join Early [facility] Online AA Meeting Password: [phone]   SATURDAY, 8:00 AM - 9:00 AM Early [place] Discussion Open Virtual Join Early [facility] Online AA Meeting Password: [phone]   SUNDAY, 8:00 AM - 9:00 AM Early [place] Discussion Open Virtual Join Early [facility] Online AA Meeting Password: [phone]     Substance Use Resources for follow up:   [address] [place] [address] [phone]  [facility] [facility] [phone]  [email or web address]  [employer] of [place] [email or web address]

## 2024-05-18 ENCOUNTER — Emergency Department
Admission: EM | Admit: 2024-05-18 | Discharge: 2024-05-18 | Disposition: A | Discharge status: Home / Self Care | Payer: MEDICAID | Attending: Emergency Medicine | Admitting: Emergency Medicine

## 2024-05-18 ENCOUNTER — Emergency Department: Payer: MEDICAID

## 2024-05-18 ENCOUNTER — Other Ambulatory Visit: Payer: Self-pay

## 2024-05-18 DIAGNOSIS — W19XXXA Unspecified fall, initial encounter: Secondary | ICD-10-CM

## 2024-05-18 DIAGNOSIS — W01198A Fall on same level from slipping, tripping and stumbling with subsequent striking against other object, initial encounter: Secondary | ICD-10-CM | POA: Diagnosis not present

## 2024-05-18 DIAGNOSIS — S0990XA Unspecified injury of head, initial encounter: Secondary | ICD-10-CM | POA: Diagnosis present

## 2024-05-18 LAB — CBG MONITORING, ED: Glucose-Capillary: 87 mg/dL (ref 70–99)

## 2024-05-18 NOTE — ED Provider Notes (Signed)
 [facility] Provider Note    Event Date/Time   First MD Initiated Contact with Patient 05/18/24 1247     (approximate)   History   Fall   HPI  Miguel [name] is a 63 y.o. male with alcohol abuse.  Also reports a recent fall with rib fractures  Today, patient reports he went to the store to buy something.  He got home and fell when he got home.  He does relate that he had a shot of liquor prior to getting out of his car and has been using alcohol.    He relates that he thinks he hit the left side of his head.  He did not pass out.  He has been in his normal health.  Reports he stumbled as he got another car  No neck pain.  Some mild pain in the front of his ribs especially on the left which has been present for a couple weeks since he broke his ribs.  No other injury     Physical Exam   Triage Vital Signs: ED Triage Vitals  Encounter Vitals Group     BP      Girls Systolic BP Percentile      Girls Diastolic BP Percentile      Boys Systolic BP Percentile      Boys Diastolic BP Percentile      Pulse      Resp      Temp      Temp src      SpO2      Weight      Height      Head Circumference      Peak Flow      Pain Score      Pain Loc      Pain Education      Exclude from Growth Chart     Most recent vital signs: Vitals:   05/18/24 1305 05/18/24 1309  BP: (!) 133/94   Pulse: 91   Resp: 18   Temp:  98.3 F (36.8 C)  SpO2: 95%      General: Awake, no distress.  Normocephalic atraumatic.  Cervical collar in place. CV:  Good peripheral perfusion.  Normal tones and rate Resp:  Normal effort.  Clear lungs bilateral Abd:  No distention.  Soft nontender nondistended Other:  Moves all extremities well without deficit.  Strong peripheral pulses   ED Results / Procedures / Treatments   Labs (all labs ordered are listed, but only abnormal results are displayed) Labs Reviewed  CBG MONITORING, ED     EKG  Reviewed by me negative  for acute ischemia   RADIOLOGY  CT Chest Wo Contrast Result Date: 05/18/2024 CLINICAL DATA:  Chest trauma, blunt reports rib fractures 2 weeks ago, fall today. EXAM: CT CHEST WITHOUT CONTRAST TECHNIQUE: Multidetector CT imaging of the chest was performed following the standard protocol without IV contrast. RADIATION DOSE REDUCTION: This exam was performed according to the departmental dose-optimization program which includes automated exposure control, adjustment of the mA and/or kV according to patient size and/or use of iterative reconstruction technique. COMPARISON:  CT chest from 12/23/2023. FINDINGS: Cardiovascular: Normal cardiac size. No pericardial effusion. No aortic aneurysm. There are coronary artery calcifications, in keeping with coronary artery disease. There are also minimal peripheral atherosclerotic vascular calcifications of thoracic aorta and its major branches. Mediastinum/Nodes: Visualized thyroid  gland appears grossly unremarkable. No solid / cystic mediastinal masses. The esophagus is nondistended precluding optimal assessment.  There is mild circumferential thickening of the lower thoracic esophagus, which is most likely seen in the settings of chronic gastroesophageal reflux disease versus esophagitis. There is also a small sliding hiatal hernia. No mediastinal or axillary lymphadenopathy by size criteria. Evaluation of bilateral hila is limited due to lack on intravenous contrast: however, no large hilar lymphadenopathy identified. Lungs/Pleura: The central tracheo-bronchial tree is patent. There are patchy areas of linear, plate-like atelectasis and/or scarring throughout bilateral lungs. No mass or consolidation. No pleural effusion or pneumothorax. No suspicious lung nodules. Upper Abdomen: Surgically absent gallbladder. Small diverticulum arising from the second part of duodenum noted. There are at least 3 1-2 mm sized nonobstructing calculi in the visualized left kidney and at  least 2, 1-2 mm nonobstructing calculus in the visualize right kidney. Remaining visualized upper abdominal viscera within normal limits. Musculoskeletal: The visualized soft tissues of the chest wall are grossly unremarkable. No suspicious osseous lesions. There are subacute/healing fractures of the anterolateral left fifth through eighth and tenth ribs. There are also subacute/healing fractures of the right posteromedial eleventh and twelfth ribs. There also multiple chronic healed fractures bilaterally, for example right anteromedial fourth through eighth ribs and left anteromedial seventh and ninth ribs. No vertebral compression fracture. There are mild multilevel degenerative changes in the visualized spine. IMPRESSION: 1. No acute traumatic injury to the chest. 2. There are subacute/healing fractures of the anterolateral left fifth through eighth, tenth ribs and right posteromedial eleventh and twelfth ribs. 3. Multiple other nonacute observations, as described above. Aortic Atherosclerosis (ICD10-I70.0). Electronically Signed   By: Ree Molt M.D.   On: 05/18/2024 14:26   CT Head Wo Contrast Result Date: 05/18/2024 CLINICAL DATA:  Fall, head injury, ETOH EXAM: CT HEAD WITHOUT CONTRAST CT CERVICAL SPINE WITHOUT CONTRAST TECHNIQUE: Multidetector CT imaging of the head and cervical spine was performed following the standard protocol without intravenous contrast. Multiplanar CT image reconstructions of the cervical spine were also generated. RADIATION DOSE REDUCTION: This exam was performed according to the departmental dose-optimization program which includes automated exposure control, adjustment of the mA and/or kV according to patient size and/or use of iterative reconstruction technique. COMPARISON:  04/21/2024 FINDINGS: CT HEAD FINDINGS Brain: No evidence of acute infarction, hemorrhage, hydrocephalus, extra-axial collection or mass lesion/mass effect. Mild periventricular white matter hypodensity.  Vascular: No hyperdense vessel or unexpected calcification. Skull: Normal. Negative for fracture or focal lesion. Sinuses/Orbits: No acute finding. Chronic mucosal and bony sinus wall thickening of the left maxillary sinus. Other: None. CT CERVICAL SPINE FINDINGS Alignment: Normal. Skull base and vertebrae: No acute fracture. No primary bone lesion or focal pathologic process. Soft tissues and spinal canal: No prevertebral fluid or swelling. No visible canal hematoma. Disc levels: Focally moderate disc space height loss and osteophytosis of C6-C7 with otherwise mild disc degenerative change. Upper chest: Negative. Other: None. IMPRESSION: 1. No acute intracranial pathology. 2. No fracture or static subluxation of the cervical spine. 3. Focally moderate disc space height loss and osteophytosis of C6-C7 with otherwise mild disc degenerative change. Electronically Signed   By: Marolyn JONETTA Jaksch M.D.   On: 05/18/2024 14:18   CT Cervical Spine Wo Contrast Result Date: 05/18/2024 CLINICAL DATA:  Fall, head injury, ETOH EXAM: CT HEAD WITHOUT CONTRAST CT CERVICAL SPINE WITHOUT CONTRAST TECHNIQUE: Multidetector CT imaging of the head and cervical spine was performed following the standard protocol without intravenous contrast. Multiplanar CT image reconstructions of the cervical spine were also generated. RADIATION DOSE REDUCTION: This exam was performed according to the  departmental dose-optimization program which includes automated exposure control, adjustment of the mA and/or kV according to patient size and/or use of iterative reconstruction technique. COMPARISON:  04/21/2024 FINDINGS: CT HEAD FINDINGS Brain: No evidence of acute infarction, hemorrhage, hydrocephalus, extra-axial collection or mass lesion/mass effect. Mild periventricular white matter hypodensity. Vascular: No hyperdense vessel or unexpected calcification. Skull: Normal. Negative for fracture or focal lesion. Sinuses/Orbits: No acute finding. Chronic  mucosal and bony sinus wall thickening of the left maxillary sinus. Other: None. CT CERVICAL SPINE FINDINGS Alignment: Normal. Skull base and vertebrae: No acute fracture. No primary bone lesion or focal pathologic process. Soft tissues and spinal canal: No prevertebral fluid or swelling. No visible canal hematoma. Disc levels: Focally moderate disc space height loss and osteophytosis of C6-C7 with otherwise mild disc degenerative change. Upper chest: Negative. Other: None. IMPRESSION: 1. No acute intracranial pathology. 2. No fracture or static subluxation of the cervical spine. 3. Focally moderate disc space height loss and osteophytosis of C6-C7 with otherwise mild disc degenerative change. Electronically Signed   By: Marolyn JONETTA Jaksch M.D.   On: 05/18/2024 14:18      PROCEDURES:  Critical Care performed: No  Procedures   MEDICATIONS ORDERED IN ED: Medications - No data to display   IMPRESSION / MDM / ASSESSMENT AND PLAN / ED COURSE  I reviewed the triage vital signs and the nursing notes.                              Differential diagnosis includes, but is not limited to, who presents after falling.  He reports he stumbled as he got out of his car.  He does report use of alcohol and I suspect that his likely associated.  He is awake alert well-oriented in no distress.  He does not show evidence of acute intoxication.  He does not have clear evidence of trauma by exam but reports a history of striking his head.  His vital signs are reassuring and he reports no recent preceding illness or acute neurologic cardiac or pulmonary symptoms etc.    Patient's presentation is most consistent with acute complicated illness / injury requiring diagnostic workup.      Clinical Course as of 05/18/24 1916  Thu May 18, 2024  1444 Patient alert, oriented.  Able to ambulate back-and-forth from the hallway down to approximately room 3 and back without difficulty or assistance.  Reassuring CT imaging  without acute traumatic injury.  Patient fully awake and alert.  He was counseled on alcohol use, and will be taking a taxi home.  He assures me he does not drink while driving, he drives and then usually will have a drink in the car when he gets home (as was the case today per patient) [MQ]    Clinical Course User Index [MQ] Dicky Anes, MD   Return precautions and treatment recommendations and follow-up discussed with the patient who is agreeable with the plan. Going via Taxi to his home  FINAL CLINICAL IMPRESSION(S) / ED DIAGNOSES   Final diagnoses:  Fall, initial encounter  Minor head injury, initial encounter     Rx / DC Orders   ED Discharge Orders     None        Note:  This document was prepared using Dragon voice recognition software and may include unintentional dictation errors.   Dicky Anes, MD 05/18/24 [phone]

## 2024-05-18 NOTE — ED Triage Notes (Signed)
 Pt to ED via [facility] for c/o fall while trying to get in car.. Pt reports ETOH prior to fall. Pt reports hitting head. Pain 8/10

## 2024-05-22 ENCOUNTER — Emergency Department: Payer: MEDICAID

## 2024-05-22 ENCOUNTER — Emergency Department
Admission: EM | Admit: 2024-05-22 | Discharge: 2024-05-23 | Disposition: A | Discharge status: Home / Self Care | Payer: MEDICAID | Attending: Emergency Medicine | Admitting: Emergency Medicine

## 2024-05-22 DIAGNOSIS — Y908 Blood alcohol level of 240 mg/100 ml or more: Secondary | ICD-10-CM | POA: Insufficient documentation

## 2024-05-22 DIAGNOSIS — M542 Cervicalgia: Secondary | ICD-10-CM | POA: Insufficient documentation

## 2024-05-22 DIAGNOSIS — M25562 Pain in left knee: Secondary | ICD-10-CM | POA: Diagnosis not present

## 2024-05-22 DIAGNOSIS — F10920 Alcohol use, unspecified with intoxication, uncomplicated: Secondary | ICD-10-CM

## 2024-05-22 DIAGNOSIS — R519 Headache, unspecified: Secondary | ICD-10-CM | POA: Insufficient documentation

## 2024-05-22 DIAGNOSIS — W01198A Fall on same level from slipping, tripping and stumbling with subsequent striking against other object, initial encounter: Secondary | ICD-10-CM | POA: Diagnosis not present

## 2024-05-22 DIAGNOSIS — W19XXXA Unspecified fall, initial encounter: Secondary | ICD-10-CM

## 2024-05-22 DIAGNOSIS — F10120 Alcohol abuse with intoxication, uncomplicated: Secondary | ICD-10-CM | POA: Diagnosis present

## 2024-05-22 LAB — CBC WITH DIFFERENTIAL/PLATELET
Abs Immature Granulocytes: 0.04 K/uL (ref 0.00–0.07)
Basophils Absolute: 0.1 K/uL (ref 0.0–0.1)
Basophils Relative: 1 %
Eosinophils Absolute: 0.1 K/uL (ref 0.0–0.5)
Eosinophils Relative: 2 %
HCT: 34.5 % — ABNORMAL LOW (ref 39.0–52.0)
Hemoglobin: 11.5 g/dL — ABNORMAL LOW (ref 13.0–17.0)
Immature Granulocytes: 0 %
Lymphocytes Relative: 15 %
Lymphs Abs: 1.4 K/uL (ref 0.7–4.0)
MCH: 30.4 pg (ref 26.0–34.0)
MCHC: 33.3 g/dL (ref 30.0–36.0)
MCV: 91.3 fL (ref 80.0–100.0)
Monocytes Absolute: 0.8 K/uL (ref 0.1–1.0)
Monocytes Relative: 9 %
Neutro Abs: 7 K/uL (ref 1.7–7.7)
Neutrophils Relative %: 73 %
Platelets: 169 K/uL (ref 150–400)
RBC: 3.78 MIL/uL — ABNORMAL LOW (ref 4.22–5.81)
RDW: 16.7 % — ABNORMAL HIGH (ref 11.5–15.5)
WBC: 9.5 K/uL (ref 4.0–10.5)
nRBC: 0 % (ref 0.0–0.2)

## 2024-05-22 LAB — BASIC METABOLIC PANEL WITH GFR
Anion gap: 14 (ref 5–15)
BUN: 15 mg/dL (ref 8–23)
CO2: 21 mmol/L — ABNORMAL LOW (ref 22–32)
Calcium: 9 mg/dL (ref 8.9–10.3)
Chloride: 102 mmol/L (ref 98–111)
Creatinine, Ser: 1.15 mg/dL (ref 0.61–1.24)
GFR, Estimated: >60 mL/min (ref >60)
Glucose, Bld: 159 mg/dL — ABNORMAL HIGH (ref 70–99)
Potassium: 2.9 mmol/L — ABNORMAL LOW (ref 3.5–5.1)
Sodium: 137 mmol/L (ref 135–145)

## 2024-05-22 LAB — ETHANOL: Alcohol, Ethyl (B): 337 mg/dL (ref <15)

## 2024-05-22 MED ORDER — POTASSIUM CHLORIDE CRYS ER 20 MEQ PO TBCR
40.0000 meq | EXTENDED_RELEASE_TABLET | Freq: Once | ORAL | Status: AC
Start: 2024-05-23 — End: 2024-05-23
  Administered 2024-05-23: 40 meq via ORAL
  Filled 2024-05-22: qty 2

## 2024-05-22 NOTE — ED Provider Notes (Signed)
 [facility] Provider Note    Event Date/Time   First MD Initiated Contact with Patient 05/22/24 1906     (approximate)   History   Fall   HPI Miguel [name] is a 63 y.o. male with history of chronic alcohol abuse presenting today for fall.  Patient reports drinking 2 shots of alcohol around 6:30 PM shortly before arrival.  Had a fall at home where he states he did hit the right side of his head.  Complaining of pain in his head, neck, and left knee.  Denies injury elsewhere.  Not on any blood thinners.  Of note, patient frequently seen in the ED for alcohol abuse as well as falls related to this.     Physical Exam   Triage Vital Signs: ED Triage Vitals  Encounter Vitals Group     BP 05/22/24 1858 121/76     Girls Systolic BP Percentile --      Girls Diastolic BP Percentile --      Boys Systolic BP Percentile --      Boys Diastolic BP Percentile --      Pulse Rate 05/22/24 1858 (!) 101     Resp --      Temp 05/22/24 1858 (!) 97.4 F (36.3 C)     Temp Source 05/22/24 1858 Oral     SpO2 05/22/24 1858 93 %     Weight 05/22/24 1859 180 lb (81.6 kg)     Height 05/22/24 1859 6' (1.829 m)     Head Circumference --      Peak Flow --      Pain Score 05/22/24 1856 8     Pain Loc --      Pain Education --      Exclude from Growth Chart --     Most recent vital signs: Vitals:   05/22/24 1858  BP: 121/76  Pulse: (!) 101  Temp: (!) 97.4 F (36.3 C)  SpO2: 93%   I have reviewed the vital signs. General:  Awake, alert, no acute distress. Head:  Normocephalic, Atraumatic.  No obvious hematomas. EENT:  PERRL, EOMI, Oral mucosa pink and moist, Neck is supple.  Equivocal for C-spine tenderness palpation. Cardiovascular: Regular rate, 2+ distal pulses. Respiratory:  Normal respiratory effort, symmetrical expansion, no distress.   Extremities:  Moving all four extremities through full ROM without pain.  States mild pain when palpating over the left knee  but no obvious deformity.  Nontender to palpation otherwise throughout bilateral upper and lower extremities, chest wall, and T/L-spine. Neuro:  Alert and oriented.  Interacting appropriately.   Skin:  Warm, dry, no rash.   Psych: Appropriate affect.    ED Results / Procedures / Treatments   Labs (all labs ordered are listed, but only abnormal results are displayed) Labs Reviewed  CBC WITH DIFFERENTIAL/PLATELET - Abnormal; Notable for the following components:      Result Value   RBC 3.78 (*)    Hemoglobin 11.5 (*)    HCT 34.5 (*)    RDW 16.7 (*)    All other components within normal limits  BASIC METABOLIC PANEL WITH GFR - Abnormal; Notable for the following components:   Potassium 2.9 (*)    CO2 21 (*)    Glucose, Bld 159 (*)    All other components within normal limits  ETHANOL - Abnormal; Notable for the following components:   Alcohol, Ethyl (B) 337 (*)    All other components within normal limits  EKG    RADIOLOGY Independently interpreted CT head and C-spine which are negative.  X-ray with concern of possible new fracture to the fibula and tibial plateau.  Subsequent CT of the left knee negative for any new fracture.   PROCEDURES:  Critical Care performed: No  Procedures   MEDICATIONS ORDERED IN ED: Medications  potassium chloride  SA (KLOR-CON  M) CR tablet 40 mEq (has no administration in time range)     IMPRESSION / MDM / ASSESSMENT AND PLAN / ED COURSE  I reviewed the triage vital signs and the nursing notes.                              Differential diagnosis includes, but is not limited to, alcohol abuse, ICH, cervical spine injury, patella fracture  Patient's presentation is most consistent with exacerbation of chronic illness.  Patient is a 63 year old male with frequent ED visits for alcohol intoxication presenting today for the same associated with fall.  Does appear very clinically intoxicated on arrival here.  He reports head and neck  injury and will get CT imaging of this given current intoxication.  Also reports left knee pain so we will get x-ray.  No other obvious deformities or tenderness anywhere throughout his extremities, chest, abdomen, or spine.  Alcohol level 337.  Mild hypokalemia at 2.9 which would give oral repletion for.  CBC negative.  CT imaging of head and neck also negative.  X-ray of his left knee showed possible chronic versus acute on chronic fracture.  Subsequent CT of left knee shows no acute fractures.  Patient was otherwise reassessed and clinically sober at this time and we will set him up for discharge pending transportation home as he does not have a ride.  Signed out to oncoming provider pending ride home.  The patient is on the cardiac monitor to evaluate for evidence of arrhythmia and/or significant heart rate changes. Clinical Course as of 05/22/24 2358  Mon May 22, 2024  2247 CT Knee Left Wo Contrast No new acute fractures [DW]    Clinical Course User Index [DW] Malvina Alm DASEN, MD     FINAL CLINICAL IMPRESSION(S) / ED DIAGNOSES   Final diagnoses:  Alcoholic intoxication without complication (HCC)  Fall, initial encounter     Rx / DC Orders   ED Discharge Orders     None        Note:  This document was prepared using Dragon voice recognition software and may include unintentional dictation errors.   Malvina Alm DASEN, MD 05/22/24 [phone]

## 2024-05-22 NOTE — ED Notes (Signed)
 Patient transported to CT

## 2024-05-22 NOTE — ED Triage Notes (Addendum)
 Pt arrives via [facility] from home for a fall. Pt told EMS he took 2 shots of alcohol around 1830. Recently seen here for same. Pt complaining of pain in his head.

## 2024-05-22 NOTE — Discharge Instructions (Addendum)
 Please focus on cessation of alcohol drinking.  No evidence of any injury from your fall today.

## 2024-05-23 MED ORDER — ALBUTEROL SULFATE (2.5 MG/3ML) 0.083% IN NEBU
2.5000 mg | INHALATION_SOLUTION | Freq: Once | RESPIRATORY_TRACT | Status: AC
  Administered 2024-05-23: 2.5 mg via RESPIRATORY_TRACT
  Filled 2024-05-23: qty 3

## 2024-05-23 NOTE — ED Notes (Signed)
 Pt requesting something for his asthma flare up. MD Cyrena made aware and he ordered a nebulizer treatment. Pt started breathing treatment but stopped after about 30 seconds and said he would be fine. He stated he would take something at home.

## 2024-05-23 NOTE — ED Notes (Signed)
 Pt picked up by a [employer] that wife, Sharlet, arranged for pt.

## 2024-06-05 ENCOUNTER — Telehealth: Payer: Self-pay | Admitting: Family Medicine

## 2024-06-05 DIAGNOSIS — G4709 Other insomnia: Secondary | ICD-10-CM

## 2024-06-05 NOTE — Telephone Encounter (Signed)
[facility] faxed refill request for the following medications: ° °traZODone (DESYREL) 100 MG tablet  ° °Please advise. ° °

## 2024-06-06 MED ORDER — TRAZODONE HCL 100 MG PO TABS: 100.0000 mg | ORAL_TABLET | Freq: Every evening | ORAL | 0 refills | Status: DC | PRN

## 2024-06-06 NOTE — Telephone Encounter (Signed)
 This is a courtesy refill. Pt needs to transition his care appt to/with  a new pcp as his old pcp left the clinic.

## 2024-06-11 ENCOUNTER — Emergency Department: Payer: MEDICAID

## 2024-06-11 ENCOUNTER — Other Ambulatory Visit: Payer: Self-pay

## 2024-06-11 ENCOUNTER — Emergency Department
Admission: EM | Admit: 2024-06-11 | Discharge: 2024-06-11 | Disposition: A | Discharge status: Home / Self Care | Payer: MEDICAID | Attending: Emergency Medicine | Admitting: Emergency Medicine

## 2024-06-11 DIAGNOSIS — W01198A Fall on same level from slipping, tripping and stumbling with subsequent striking against other object, initial encounter: Secondary | ICD-10-CM | POA: Diagnosis not present

## 2024-06-11 DIAGNOSIS — Y908 Blood alcohol level of 240 mg/100 ml or more: Secondary | ICD-10-CM | POA: Diagnosis not present

## 2024-06-11 DIAGNOSIS — F1210 Cannabis abuse, uncomplicated: Secondary | ICD-10-CM | POA: Insufficient documentation

## 2024-06-11 DIAGNOSIS — S20229A Contusion of unspecified back wall of thorax, initial encounter: Secondary | ICD-10-CM

## 2024-06-11 DIAGNOSIS — F10120 Alcohol abuse with intoxication, uncomplicated: Secondary | ICD-10-CM | POA: Diagnosis not present

## 2024-06-11 DIAGNOSIS — S300XXA Contusion of lower back and pelvis, initial encounter: Secondary | ICD-10-CM | POA: Diagnosis present

## 2024-06-11 DIAGNOSIS — R519 Headache, unspecified: Secondary | ICD-10-CM | POA: Insufficient documentation

## 2024-06-11 DIAGNOSIS — F10920 Alcohol use, unspecified with intoxication, uncomplicated: Secondary | ICD-10-CM

## 2024-06-11 DIAGNOSIS — Y900 Blood alcohol level of less than 20 mg/100 ml: Secondary | ICD-10-CM | POA: Diagnosis not present

## 2024-06-11 LAB — URINE DRUG SCREEN, QUALITATIVE (ARMC ONLY)
Amphetamines, Ur Screen: NOT DETECTED
Barbiturates, Ur Screen: NOT DETECTED
Benzodiazepine, Ur Scrn: NOT DETECTED
Cannabinoid 50 Ng, Ur ~~LOC~~: POSITIVE — AB
Cocaine Metabolite,Ur ~~LOC~~: NOT DETECTED
MDMA (Ecstasy)Ur Screen: NOT DETECTED
Methadone Scn, Ur: NOT DETECTED
Opiate, Ur Screen: NOT DETECTED
Phencyclidine (PCP) Ur S: NOT DETECTED
Tricyclic, Ur Screen: NOT DETECTED

## 2024-06-11 LAB — CBC
HCT: 43.4 % (ref 39.0–52.0)
Hemoglobin: 13.8 g/dL (ref 13.0–17.0)
MCH: 29.2 pg (ref 26.0–34.0)
MCHC: 31.8 g/dL (ref 30.0–36.0)
MCV: 91.8 fL (ref 80.0–100.0)
Platelets: 289 K/uL (ref 150–400)
RBC: 4.73 MIL/uL (ref 4.22–5.81)
RDW: 15.6 % — ABNORMAL HIGH (ref 11.5–15.5)
WBC: 8.7 K/uL (ref 4.0–10.5)
nRBC: 0 % (ref 0.0–0.2)

## 2024-06-11 LAB — COMPREHENSIVE METABOLIC PANEL WITH GFR
ALT: 14 U/L (ref 0–44)
AST: 16 U/L (ref 15–41)
Albumin: 4.2 g/dL (ref 3.5–5.0)
Alkaline Phosphatase: 74 U/L (ref 38–126)
Anion gap: 11 (ref 5–15)
BUN: 9 mg/dL (ref 8–23)
CO2: 25 mmol/L (ref 22–32)
Calcium: 9.2 mg/dL (ref 8.9–10.3)
Chloride: 107 mmol/L (ref 98–111)
Creatinine, Ser: 1.01 mg/dL (ref 0.61–1.24)
GFR, Estimated: >60 mL/min (ref >60)
Glucose, Bld: 102 mg/dL — ABNORMAL HIGH (ref 70–99)
Potassium: 3.6 mmol/L (ref 3.5–5.1)
Sodium: 143 mmol/L (ref 135–145)
Total Bilirubin: 0.5 mg/dL (ref 0.0–1.2)
Total Protein: 7.8 g/dL (ref 6.5–8.1)

## 2024-06-11 LAB — ETHANOL: Alcohol, Ethyl (B): 373 mg/dL (ref <15)

## 2024-06-11 MED ORDER — LORAZEPAM 2 MG/ML IJ SOLN
2.0000 mg | Freq: Once | INTRAMUSCULAR | Status: AC
  Administered 2024-06-11: 2 mg via INTRAMUSCULAR
  Filled 2024-06-11: qty 1

## 2024-06-11 NOTE — ED Provider Notes (Signed)
 [facility] Provider Note    Event Date/Time   First MD Initiated Contact with Patient 06/11/24 [phone]     (approximate)   History   Alcohol Intoxication   HPI  Miguel [name] is a 63 y.o. male with a history of alcohol use disorder who presents with a fall and alcohol intoxication.  The patient states that he fell outside onto rocks.  He reports right-sided and midline back pain.  He states he hit his head as well.  He reports drinking heavily tonight.  I reviewed the past medical records.  The patient was seen in the ED with alcohol intoxication and a fall on 7/7, and previously on 7/3.  He was evaluated by psychiatry on 6/26 due to concern for self-harm and substance-induced mood disorder.   Physical Exam   Triage Vital Signs: ED Triage Vitals  Encounter Vitals Group     BP 06/11/24 0151 112/75     Girls Systolic BP Percentile --      Girls Diastolic BP Percentile --      Boys Systolic BP Percentile --      Boys Diastolic BP Percentile --      Pulse Rate 06/11/24 0151 98     Resp 06/11/24 0151 18     Temp 06/11/24 0151 98.3 F (36.8 C)     Temp Source 06/11/24 0151 Oral     SpO2 06/11/24 0151 94 %     Weight 06/11/24 0150 180 lb 12.4 oz (82 kg)     Height 06/11/24 0149 6' (1.829 m)     Head Circumference --      Peak Flow --      Pain Score 06/11/24 0149 5     Pain Loc --      Pain Education --      Exclude from Growth Chart --     Most recent vital signs: Vitals:   06/11/24 0151 06/11/24 0320  BP: 112/75 132/84  Pulse: 98 84  Resp: 18 17  Temp: 98.3 F (36.8 C)   SpO2: 94% 99%     General: Awake, very anxious appearing but in no acute distress. CV:  Good peripheral perfusion.  Resp:  Normal effort.  Abd:  No distention.  Other:  No significant midline cervical spinal tenderness.  Moderate midline and right paraspinal thoracic and lumbar tenderness.  Motor intact in all extremities.  Normal speech.  No tongue fasciculation.  No  significant tremor.   ED Results / Procedures / Treatments   Labs (all labs ordered are listed, but only abnormal results are displayed) Labs Reviewed  COMPREHENSIVE METABOLIC PANEL WITH GFR - Abnormal; Notable for the following components:      Result Value   Glucose, Bld 102 (*)    All other components within normal limits  ETHANOL - Abnormal; Notable for the following components:   Alcohol, Ethyl (B) 373 (*)    All other components within normal limits  CBC - Abnormal; Notable for the following components:   RDW 15.6 (*)    All other components within normal limits  URINE DRUG SCREEN, QUALITATIVE ([facility] ONLY) - Abnormal; Notable for the following components:   Cannabinoid 50 Ng, Ur  POSITIVE (*)    All other components within normal limits     EKG     RADIOLOGY  CT head: I independently viewed and interpreted the images; there is no ICH.  Radiology report indicates no acute intracranial findings.  CT cervical spine:  No acute fracture  CT thoracic/lumbar spine: No acute fracture  XR R ribs/chest: Subacute healing left rib fractures.  No acute fracture.   PROCEDURES:  Critical Care performed: No  Procedures   MEDICATIONS ORDERED IN ED: Medications  LORazepam  (ATIVAN ) injection 2 mg (2 mg Intramuscular Given 06/11/24 0432)     IMPRESSION / MDM / ASSESSMENT AND PLAN / ED COURSE  I reviewed the triage vital signs and the nursing notes.  63 year old male with PMH as noted above, well-known to this ED, presents with alcohol intoxication and a fall.  He primarily endorses midline and right-sided back pain.  On exam he is tender along the thoracic and lumbar spine and in the right paraspinal/posterior rib area.  Neurologic exam is nonfocal.  The patient appears intoxicated and very anxious, however his vital signs are normal and he has no concerning tremor or tongue fasciculation.  There is no evidence of active alcohol withdrawal.  Differential diagnosis includes,  but is not limited to, alcohol intoxication, contusion, fracture, minor head injury, concussion, TBI.  We will obtain CT of the head, CT of the entire spine, and x-rays of the right ribs.  Lab workup is significant for ethanol level of 373.  CMP and CBC show no acute findings.  UDS is positive for cannabinoids.  Patient's presentation is most consistent with acute presentation with potential threat to life or bodily function.  ----------------------------------------- 6:04 AM on 06/11/2024 -----------------------------------------  The imaging is all negative for acute traumatic findings.  There are old rib fractures on the left.  On reassessment, the patient is clinically sober.  He is requesting to go home.  He is stable for discharge home at this time.  I counseled him on the results of the workup.  I gave strict return precautions, and he expressed understanding.   FINAL CLINICAL IMPRESSION(S) / ED DIAGNOSES   Final diagnoses:  Alcoholic intoxication without complication (HCC)  Contusion of back, unspecified laterality, initial encounter     Rx / DC Orders   ED Discharge Orders     None        Note:  This document was prepared using Dragon voice recognition software and may include unintentional dictation errors.    Jacolyn Pae, MD 06/11/24 [phone]

## 2024-06-11 NOTE — ED Notes (Signed)
 Patient ambulated to restroom with use of walker. Urine sample obtained and sent to lab.

## 2024-06-11 NOTE — ED Notes (Signed)
 Pt taken to CT at this time.

## 2024-06-11 NOTE — Discharge Instructions (Signed)
 We did CT scans of your head, your spine, and x-rays of the ribs.  You have no acute fractures.  Follow-up with your primary care provider.  Return to the ER for any new or worsening symptoms that concern you.

## 2024-06-11 NOTE — ED Triage Notes (Signed)
 Pt to ed from home via [facility] for alcohol intoxication. Pt is caox4, in no acute distress. Pt was hitting himself in the head fro EMS. ER staff is familiar with patient.  Vitals WNL for EMS.

## 2024-07-01 ENCOUNTER — Other Ambulatory Visit: Payer: Self-pay | Admitting: Physician Assistant

## 2024-07-01 DIAGNOSIS — G4709 Other insomnia: Secondary | ICD-10-CM

## 2024-07-05 ENCOUNTER — Other Ambulatory Visit: Payer: Self-pay | Admitting: Physician Assistant

## 2024-07-05 DIAGNOSIS — G4709 Other insomnia: Secondary | ICD-10-CM

## 2024-07-24 ENCOUNTER — Other Ambulatory Visit: Payer: Self-pay

## 2024-07-24 ENCOUNTER — Emergency Department: Payer: MEDICAID

## 2024-07-24 ENCOUNTER — Encounter: Payer: Self-pay | Admitting: Emergency Medicine

## 2024-07-24 ENCOUNTER — Emergency Department
Admission: EM | Admit: 2024-07-24 | Discharge: 2024-07-24 | Disposition: A | Discharge status: Home / Self Care | Payer: MEDICAID | Attending: Emergency Medicine | Admitting: Emergency Medicine

## 2024-07-24 DIAGNOSIS — Y92008 Other place in unspecified non-institutional (private) residence as the place of occurrence of the external cause: Secondary | ICD-10-CM | POA: Insufficient documentation

## 2024-07-24 DIAGNOSIS — M542 Cervicalgia: Secondary | ICD-10-CM | POA: Insufficient documentation

## 2024-07-24 DIAGNOSIS — W0110XA Fall on same level from slipping, tripping and stumbling with subsequent striking against unspecified object, initial encounter: Secondary | ICD-10-CM | POA: Insufficient documentation

## 2024-07-24 DIAGNOSIS — M25511 Pain in right shoulder: Secondary | ICD-10-CM | POA: Insufficient documentation

## 2024-07-24 DIAGNOSIS — I10 Essential (primary) hypertension: Secondary | ICD-10-CM | POA: Diagnosis not present

## 2024-07-24 DIAGNOSIS — W19XXXA Unspecified fall, initial encounter: Secondary | ICD-10-CM

## 2024-07-24 DIAGNOSIS — Y9301 Activity, walking, marching and hiking: Secondary | ICD-10-CM | POA: Diagnosis not present

## 2024-07-24 NOTE — ED Notes (Signed)
 Patient transported to X-ray

## 2024-07-24 NOTE — ED Triage Notes (Signed)
 Pt arrived via [facility] from home post fall after consuming ETOH. Pt tripped going up wheelchair ramp, hitting his right arm. Pt c/o posterior neck and right arm pain. Denies LOC, no blood thinners.

## 2024-07-24 NOTE — ED Provider Notes (Signed)
 [facility] Provider Note    Event Date/Time   First MD Initiated Contact with Patient 07/24/24 2110     (approximate)   History   Fall  HPI  Miguel [name] is a 63 y.o. male   history of multiple previous falls, hypertension alcohol use  Patient presents today reports he was walking at his home when he stumbled on his wheelchair ramp.  He fell striking his right shoulder.  It feels sore but he still able to use it.  He also reports he is having some neck pain.  Does not think he hit his head but does report he did use alcohol previous to falling.  No chest pain no trouble breathing.  No headache.  Reports pain along the mid to right lower neck points towards the along with some pain over his right shoulder.  None in the chest        Physical Exam   Triage Vital Signs: ED Triage Vitals  Encounter Vitals Group     BP 07/24/24 2057 122/88     Girls Systolic BP Percentile --      Girls Diastolic BP Percentile --      Boys Systolic BP Percentile --      Boys Diastolic BP Percentile --      Pulse Rate 07/24/24 2057 98     Resp 07/24/24 2057 18     Temp 07/24/24 2057 98.3 F (36.8 C)     Temp Source 07/24/24 2057 Oral     SpO2 07/24/24 2057 98 %     Weight 07/24/24 2104 180 lb 12.4 oz (82 kg)     Height 07/24/24 2104 6' (1.829 m)     Head Circumference --      Peak Flow --      Pain Score 07/24/24 2104 7     Pain Loc --      Pain Education --      Exclude from Growth Chart --     Most recent vital signs: Vitals:   07/24/24 2057  BP: 122/88  Pulse: 98  Resp: 18  Temp: 98.3 F (36.8 C)  SpO2: 98%     General: Awake, no distress.  Normocephalic atraumatic.  Reports tenderness to palpation along the lower portion of the cervical spine but mostly in the lateral paraspinous musculature No tenderness over the clavicles.  Demonstrates good range of motion of both shoulders but reports pain with abduction.  Also reports tenderness over the  right anterior shoulder.  Strong radial pulse normal use of the right hand elbow and no deformities are noted.  Examination of the anterior chest and back shows no obvious contusion or bruising but reports tenderness over the right lateral glenoid  CV:  Good peripheral perfusion.  Normal tones and rate Resp:  Normal effort.  Clear Abd:  No distention.  Other:  Currently ambulating with steady gait.  Alert oriented.  No nystagmus.  Speech is clear.  Fully alert and oriented   ED Results / Procedures / Treatments   Labs (all labs ordered are listed, but only abnormal results are displayed) Labs Reviewed - No data to display   EKG     RADIOLOGY  CT head inter by me is grossly negative for acute intracranial hemorrhage.   DG Shoulder Right Result Date: 07/24/2024 CLINICAL DATA:  Pain after fall EXAM: RIGHT SHOULDER - 2+ VIEW COMPARISON:  06/16/2023 FINDINGS: Internal rotation, external rotation, and transscapular views of the right shoulder  are obtained. No acute fracture, subluxation, or dislocation. Mild hypertrophic changes of the acromioclavicular joint. Glenohumeral joint space is well preserved. Soft tissues are unremarkable. Right chest is clear. IMPRESSION: 1. Mild osteoarthritis.  No acute fracture. Electronically Signed   By: Ozell Daring M.D.   On: 07/24/2024 22:08   CT Cervical Spine Wo Contrast Result Date: 07/24/2024 CLINICAL DATA:  Intoxicated, fell, facial trauma EXAM: CT CERVICAL SPINE WITHOUT CONTRAST TECHNIQUE: Multidetector CT imaging of the cervical spine was performed without intravenous contrast. Multiplanar CT image reconstructions were also generated. RADIATION DOSE REDUCTION: This exam was performed according to the departmental dose-optimization program which includes automated exposure control, adjustment of the mA and/or kV according to patient size and/or use of iterative reconstruction technique. COMPARISON:  06/11/2024 FINDINGS: Alignment: Alignment is  anatomic. Skull base and vertebrae: No acute fracture. No primary bone lesion or focal pathologic process. Soft tissues and spinal canal: No prevertebral fluid or swelling. No visible canal hematoma. Disc levels:  Mild multilevel spondylosis greatest at C6-7. Upper chest: Airway is patent. Visualized portions of the lung apices are clear. Other: Reconstructed images demonstrate no additional findings. IMPRESSION: 1. No acute cervical spine fracture. Electronically Signed   By: Ozell Daring M.D.   On: 07/24/2024 22:04   CT Head Wo Contrast Result Date: 07/24/2024 CLINICAL DATA:  Clemens, intoxicated, facial trauma EXAM: CT HEAD WITHOUT CONTRAST TECHNIQUE: Contiguous axial images were obtained from the base of the skull through the vertex without intravenous contrast. RADIATION DOSE REDUCTION: This exam was performed according to the departmental dose-optimization program which includes automated exposure control, adjustment of the mA and/or kV according to patient size and/or use of iterative reconstruction technique. COMPARISON:  06/11/2024 FINDINGS: Brain: No acute infarct or hemorrhage. Lateral ventricles and midline structures are unremarkable. No acute extra-axial fluid collections. No mass effect. Vascular: No hyperdense vessel or unexpected calcification. Skull: Normal. Negative for fracture or focal lesion. 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 Other: None. IMPRESSION: 1. No acute intracranial process. Electronically Signed   By: Ozell Daring M.D.   On: 07/24/2024 22:03      PROCEDURES:  Critical Care performed: No  Procedures   MEDICATIONS ORDERED IN ED: Medications - No data to display   IMPRESSION / MDM / ASSESSMENT AND PLAN / ED COURSE  I reviewed the triage vital signs and the nursing notes.                              Differential diagnosis includes, but is not limited to, injury suffered from a fall.  Patient without obvious evidence of  significant injury or deformity with reassuring examination.  He does report alcohol use but currently no clinical signs of intoxication.  He is alert well-oriented clear speech.  No other concerns noted, but given use of alcohol CT imaging of the head is ordered to exclude intracranial injury though seems unlikely with exam.  Also evaluate for cervical injury and right shoulder injury.  Patient's presentation is most consistent with acute complicated illness / injury requiring diagnostic workup.  ----------------------------------------- 10:47 PM on 07/24/2024 ----------------------------------------- Resting comfortably in no distress.    Patient agreeable with plan for discharge, discussed his x-rays and CT scan do not show any obvious injury, suspect more of a bruising.  Follow-up with Dr. Gasper advised.  Also advised patient not to drive today or while using alcohol.  Return precautions and treatment recommendations and follow-up discussed  with the patient who is agreeable with the plan.         FINAL CLINICAL IMPRESSION(S) / ED DIAGNOSES   Final diagnoses:  Fall, initial encounter  Acute pain of right shoulder     Rx / DC Orders   ED Discharge Orders     None        Note:  This document was prepared using Dragon voice recognition software and may include unintentional dictation errors.   Dicky Anes, MD 07/24/24 2248

## 2024-07-24 NOTE — Discharge Instructions (Signed)
 No driving today as you report that you are using alcohol earlier.  Follow-up closely with your primary doctor.

## 2024-08-11 ENCOUNTER — Other Ambulatory Visit: Payer: Self-pay | Admitting: Family Medicine

## 2024-08-11 ENCOUNTER — Telehealth: Payer: Self-pay

## 2024-08-11 DIAGNOSIS — G4709 Other insomnia: Secondary | ICD-10-CM

## 2024-08-11 NOTE — Telephone Encounter (Signed)
 Tried calling patient with no answer.  There has not been any request for any refills that I can find. Patient has not been seen in over a year and will need to be seen before getting any refills.    If patient calls back [id number] please relay message and make appointment       Copied from CRM [id number]. Topic: Clinical - Prescription Issue >> Aug 11, 2024  8:47 AM Jasmin G wrote: Reason for CRM: Pt called to check on the status of his traZODone  (DESYREL ) 100 MG tablet and albuterol  (VENTOLIN  HFA) 108 (90 Base) MCG/ACT inhaler. I could not find info to relay, please call pt back at [phone], unsure if pt needs an appt before refills.

## 2024-08-11 NOTE — Telephone Encounter (Signed)
 Copied from CRM #[id number]. Topic: Clinical - Medication Refill >> Aug 11, 2024 12:50 PM Turkey B wrote: Medication: traZODone  (DESYREL ) 100 MG tablet  Has the patient contacted their pharmacy? yes  (Agent: If yes, when and what did the pharmacy advise?)contact pcp  This is the patient's preferred pharmacy:  [facility] [address] [place] - [address] [place] [address] Phone: [phone] Fax: [phone]  Is this the correct pharmacy for this prescription? yes   Has the prescription been filled recently? no  Is the patient out of the medication? yes  Has the patient been seen for an appointment in the last year OR does the patient have an upcoming appointment? yes  Can we respond through MyChart? yes  Agent: Please be advised that Rx refills may take up to 3 business days. We ask that you follow-up with your pharmacy.

## 2024-08-14 MED ORDER — TRAZODONE HCL 100 MG PO TABS: 100.0000 mg | ORAL_TABLET | Freq: Every evening | ORAL | 0 refills | Status: AC | PRN

## 2024-08-14 NOTE — Telephone Encounter (Signed)
 Requested Prescriptions  Pending Prescriptions Disp Refills   traZODone  (DESYREL ) 100 MG tablet 90 tablet 0    Sig: Take 1 tablet (100 mg total) by mouth at bedtime as needed.     Psychiatry: Antidepressants - Serotonin Modulator Failed - 08/14/2024  2:10 PM      Failed - Valid encounter within last 6 months    Recent Outpatient Visits   None            Passed - Completed PHQ-2 or PHQ-9 in the last 360 days

## 2024-08-30 ENCOUNTER — Other Ambulatory Visit: Payer: Self-pay

## 2024-08-30 ENCOUNTER — Telehealth: Payer: Self-pay | Admitting: Family Medicine

## 2024-08-30 DIAGNOSIS — R0602 Shortness of breath: Secondary | ICD-10-CM

## 2024-08-30 MED ORDER — FLUTICASONE-SALMETEROL 100-50 MCG/ACT IN AEPB: 1.0000 | INHALATION_SPRAY | Freq: Two times a day (BID) | RESPIRATORY_TRACT | 11 refills | Status: AC

## 2024-08-30 NOTE — Telephone Encounter (Signed)
 Converted into a refill request

## 2024-08-30 NOTE — Telephone Encounter (Signed)
 [facility] faxed refill request for the following medications:  fluticasone -salmeterol (ADVAIR ) 100-50 MCG/ACT AEPB    Please advise.

## 2024-09-06 ENCOUNTER — Ambulatory Visit: Payer: MEDICAID | Admitting: Family Medicine

## 2024-09-28 ENCOUNTER — Emergency Department
Admission: EM | Admit: 2024-09-28 | Discharge: 2024-09-29 | Disposition: A | Discharge status: Home / Self Care | Attending: Emergency Medicine | Admitting: Emergency Medicine

## 2024-09-28 ENCOUNTER — Other Ambulatory Visit: Payer: Self-pay

## 2024-09-28 ENCOUNTER — Emergency Department

## 2024-09-28 DIAGNOSIS — F10120 Alcohol abuse with intoxication, uncomplicated: Secondary | ICD-10-CM | POA: Insufficient documentation

## 2024-09-28 DIAGNOSIS — J45909 Unspecified asthma, uncomplicated: Secondary | ICD-10-CM | POA: Insufficient documentation

## 2024-09-28 DIAGNOSIS — I10 Essential (primary) hypertension: Secondary | ICD-10-CM | POA: Insufficient documentation

## 2024-09-28 DIAGNOSIS — Z87891 Personal history of nicotine dependence: Secondary | ICD-10-CM | POA: Insufficient documentation

## 2024-09-28 DIAGNOSIS — Y908 Blood alcohol level of 240 mg/100 ml or more: Secondary | ICD-10-CM | POA: Insufficient documentation

## 2024-09-28 DIAGNOSIS — F1994 Other psychoactive substance use, unspecified with psychoactive substance-induced mood disorder: Secondary | ICD-10-CM | POA: Insufficient documentation

## 2024-09-28 DIAGNOSIS — R45851 Suicidal ideations: Secondary | ICD-10-CM | POA: Insufficient documentation

## 2024-09-28 DIAGNOSIS — F10920 Alcohol use, unspecified with intoxication, uncomplicated: Secondary | ICD-10-CM

## 2024-09-28 LAB — CBC
HCT: 46.9 % (ref 39.0–52.0)
Hemoglobin: 15.3 g/dL (ref 13.0–17.0)
MCH: 28.2 pg (ref 26.0–34.0)
MCHC: 32.6 g/dL (ref 30.0–36.0)
MCV: 86.5 fL (ref 80.0–100.0)
Platelets: 199 K/uL (ref 150–400)
RBC: 5.42 MIL/uL (ref 4.22–5.81)
RDW: 18.8 % — ABNORMAL HIGH (ref 11.5–15.5)
WBC: 9 K/uL (ref 4.0–10.5)
nRBC: 0 % (ref 0.0–0.2)

## 2024-09-28 LAB — COMPREHENSIVE METABOLIC PANEL WITH GFR
ALT: 16 U/L (ref 0–44)
AST: 17 U/L (ref 15–41)
Albumin: 4.8 g/dL (ref 3.5–5.0)
Alkaline Phosphatase: 98 U/L (ref 38–126)
Anion gap: 15 (ref 5–15)
BUN: 15 mg/dL (ref 8–23)
CO2: 24 mmol/L (ref 22–32)
Calcium: 9.7 mg/dL (ref 8.9–10.3)
Chloride: 100 mmol/L (ref 98–111)
Creatinine, Ser: 1.17 mg/dL (ref 0.61–1.24)
GFR, Estimated: >60 mL/min (ref >60)
Glucose, Bld: 100 mg/dL — ABNORMAL HIGH (ref 70–99)
Potassium: 4.1 mmol/L (ref 3.5–5.1)
Sodium: 139 mmol/L (ref 135–145)
Total Bilirubin: 0.4 mg/dL (ref 0.0–1.2)
Total Protein: 8 g/dL (ref 6.5–8.1)

## 2024-09-28 LAB — ETHANOL: Alcohol, Ethyl (B): 313 mg/dL (ref <15)

## 2024-09-28 MED ORDER — LORAZEPAM 2 MG/ML IJ SOLN
2.0000 mg | Freq: Once | INTRAMUSCULAR | Status: AC
  Administered 2024-09-28: 2 mg via INTRAMUSCULAR
  Filled 2024-09-28: qty 1

## 2024-09-28 MED ORDER — HALOPERIDOL LACTATE 5 MG/ML IJ SOLN
5.0000 mg | Freq: Once | INTRAMUSCULAR | Status: AC
  Administered 2024-09-28: 5 mg via INTRAMUSCULAR
  Filled 2024-09-28: qty 1
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
## 2024-09-28 NOTE — ED Notes (Signed)
 Patient transported to X-ray with tech and security present

## 2024-09-28 NOTE — ED Triage Notes (Signed)
 Pt to ed from home via [facility] with [employer] in handcuffs to the stretcher for ETOH and possible SI attempt. Pt has superficial laceration to abdomen area. Wife called 911.   169/74 90 HR  96% RA

## 2024-09-28 NOTE — ED Notes (Signed)
 TTS at bedside. Pt calm and cooperative at this time.

## 2024-09-28 NOTE — BH Assessment (Signed)
 Comprehensive Clinical Assessment (CCA) Screening, Triage and Referral Note  09/28/2024 Miguel [name] [id number] Recommendations for Services/Supports/Treatments: [facility]ris consult/Disposition pending. Miguel [name] is a 63 year old, English speaking, Caucasian male. Pt is under IVC. Per triage note: Pt to ed from home via [facility] with [employer] in handcuffs to the stretcher for ETOH and possible SI attempt. Pt has superficial laceration to abdomen area. Wife called 911.  Chief Complaint Patient presented following a self-inflicted cut, reported as accidental. History of Present Illness On assessment, patient appeared anxious and drowsy with restless psychomotor activity. Despite this, patient attempted to answer questions to the best of his ability. Patient reported that cutting was accidental and identified a need to avoid alcohol when asked about current needs. Patient disclosed drinking a pint of wine prior to arrival. BAL: 313; UDS pending. Primary stressor identified as caring for chronically ill wife. Patient expressed sadness and became tearful during assessment. Mental Status Examination Appearance: Disheveled Behavior: Restless psychomotor activity Speech: Slurred Orientation: Oriented 4 Mood: Anxious Affect: Depressed Thought Process: Logical and goal-directed Thought Content: No delusions; denies SI/HI/AVH Perception: No response to internal/external stimuli Insight/Judgment: Limited, but able to identify need to avoid alcohol Risk Assessment Suicidal Ideation: Denied Homicidal Ideation: Denied Hallucinations: Denied Delusions: None observed Substance Use Reports drinking a pint of wine prior to arrival BAL: 313 UDS: Pending Psychosocial Factors Main stressor: Caring for chronically ill spouse Expressed feelings of sadness and tearfulness Plan Monitor for withdrawal symptoms Complete UDS and review results Provide supportive counseling Consider referral for substance use treatment  and coping strategies Reassess mental status when patient is clinically sober Chief Complaint:  Chief Complaint  Patient presents with   Alcohol Intoxication   Psychiatric Evaluation   Visit Diagnosis: Alcohol use disorder - Recurrent non-suicidal self-injury  - Adjustment disorder with depressed mood   Patient Reported Information How did you hear about us ? Legal System  What Is the Reason for Your Visit/Call Today? Patient brought in via [facility] with c/o self-inflicted superficial lacerations to abdomen. Patient noted to have multiple superficial lacerations to abdomen- no bleeding noted on assessment. Patient reported he did this to himself because he was financially struggling and only has 5 dollars to his name. Patient admitted to ETOH use- appears intoxicated upon arrival. Patient non-compliant with dressing out and becoming verbally aggressive with staff and police officer. Patient using racial slurs towards staff and threatening to fight staff members. ED MD notified and orders placed. IM ativan  given as ordered by this RN- patient took injection voluntarily, no manual hold required. Post-administration, patient continues to say inappropriate comments to staff. Security, allied, and research officer, trade union remain at patient door for safety of staff and other patients.  How Long Has This Been Causing You Problems? > than 6 months  What Do You Feel Would Help You the Most Today? Treatment for Depression or other mood problem   Have You Recently Had Any Thoughts About Hurting Yourself? Yes  Are You Planning to Commit Suicide/Harm Yourself At This time? No   Have you Recently Had Thoughts About Hurting Someone Miguel [name]? No  Are You Planning to Harm Someone at This Time? No  Explanation: No data recorded  Have You Used Any Alcohol or Drugs in the Past 24 Hours? Yes  How Long Ago Did You Use Drugs or Alcohol? 05/09/24  What Did You Use and How Much? Alcohol 1/2 pint   Do You Currently Have  a Therapist/Psychiatrist? No  Name of Therapist/Psychiatrist: No data recorded  Have You  Been Recently Discharged From Any Office Practice or Programs? No  Explanation of Discharge From Practice/Program: No data recorded   CCA Screening Triage Referral Assessment Type of Contact: Face-to-Face  Telemedicine Service Delivery:   Is this Initial or Reassessment?   Date Telepsych consult ordered in [facility]:    Time Telepsych consult ordered in [facility]:    Location of Assessment: [facility] ED  Provider Location: [facility] ED    Collateral Involvement: No data recorded  Does Patient Have a Court Appointed Legal Guardian? No data recorded Name and Contact of Legal Guardian: No data recorded If Minor and Not Living with Parent(s), Who has Custody? No data recorded Is CPS involved or ever been involved? Never  Is APS involved or ever been involved? Never   Patient Determined To Be At Risk for Harm To Self or Others Based on Review of Patient Reported Information or Presenting Complaint? Yes, for Self-Harm  Method: No data recorded Availability of Means: No data recorded Intent: No data recorded Notification Required: No data recorded Additional Information for Danger to Others Potential: No data recorded Additional Comments for Danger to Others Potential: No data recorded Are There Guns or Other Weapons in Your Home? No  Types of Guns/Weapons: Patient had access to a knife  Are These Weapons Safely Secured?                            No data recorded Who Could Verify You Are Able To Have These Secured: No data recorded Do You Have any Outstanding Charges, Pending Court Dates, Parole/Probation? No data recorded Contacted To Inform of Risk of Harm To Self or Others: No data recorded  Does Patient Present under Involuntary Commitment? Yes    [place] of Residence: [place]   Patient Currently Receiving the Following Services: Medication Management   Determination of Need: Emergent (2  hours)   Options For Referral: No data recorded  Disposition Recommendation per psychiatric provider: Pending psych consult.  Miguel [name] R Miguel [name], LCAS

## 2024-09-28 NOTE — ED Notes (Signed)
 Fall risk bundle is currently in place.

## 2024-09-28 NOTE — ED Notes (Addendum)
 Placed pts bedroom shoes and shorts put in a pt belonging bag, put a pt sticker on the bag, and placed the bag on the Quad cart. Dressed pt out.

## 2024-09-29 ENCOUNTER — Emergency Department

## 2024-09-29 DIAGNOSIS — F1090 Alcohol use, unspecified, uncomplicated: Secondary | ICD-10-CM | POA: Diagnosis not present

## 2024-09-29 DIAGNOSIS — F1994 Other psychoactive substance use, unspecified with psychoactive substance-induced mood disorder: Secondary | ICD-10-CM

## 2024-09-29 MED ORDER — THIAMINE MONONITRATE 100 MG PO TABS: 100.0000 mg | ORAL_TABLET | Freq: Every day | ORAL | Status: DC

## 2024-09-29 MED ORDER — LORAZEPAM 1 MG PO TABS: 1.0000 mg | ORAL_TABLET | ORAL | Status: DC | PRN

## 2024-09-29 MED ORDER — ADULT MULTIVITAMIN W/MINERALS CH: 1.0000 | ORAL_TABLET | Freq: Every day | ORAL | Status: DC

## 2024-09-29 MED ORDER — THIAMINE HCL 100 MG/ML IJ SOLN: 100.0000 mg | Freq: Every day | INTRAMUSCULAR | Status: DC

## 2024-09-29 MED ORDER — LORAZEPAM 2 MG/ML IJ SOLN: 1.0000 mg | INTRAMUSCULAR | Status: DC | PRN

## 2024-09-29 MED ORDER — ONDANSETRON 4 MG PO TBDP
4.0000 mg | ORAL_TABLET | Freq: Once | ORAL | Status: AC
  Administered 2024-09-29: 4 mg via ORAL
  Filled 2024-09-29: qty 1

## 2024-09-29 MED ORDER — FOLIC ACID 1 MG PO TABS: 1.0000 mg | ORAL_TABLET | Freq: Every day | ORAL | Status: DC

## 2024-09-29 NOTE — Consult Note (Signed)
 Miguel [name]  Patient Name: Miguel [name] MRN: [id number] DOB: [birth year] DATE OF Consult: 09/29/2024  PRIMARY PSYCHIATRIC DIAGNOSES  1.  Alcohol use disorder 2.  Substance induced mood disorder 3.   Rule out depression, unspecified   RECOMMENDATIONS  Recommendations: Medication recommendations: None at this time Non-Medication/therapeutic recommendations: Follow up with outpatient counselor; follow up with primary care provider; resources for outpatient substance use treatment and psychiatry; crisis line information; ED return precautions  Is inpatient psychiatric hospitalization recommended for this patient? No (Explain why): Denies suicidal and homicidal ideation, was intoxicated when he superficially cut himself and does not recall doing so and is remorseful about it From a psychiatric perspective, is this patient appropriate for discharge to an outpatient setting/resource or other less restrictive environment for continued care?  Yes (Explain why): Denies suicidal and homicidal ideation, remorseful about superficially cutting himself while intoxicated and doesn't recall doing so Follow-Up Telepsychiatry C/L services: We will sign off for now. Please re-consult our service if needed for any concerning changes in the patient's condition, discharge planning, or questions. Communication: Treatment team members (and family members if applicable) who were involved in treatment/care discussions and planning, and with whom we spoke or engaged with via secure text/chat, include the following: Team via [employer] chat    On evaluation, patient noted to be cooperative, tearful, remorseful about events leading to ED presentation though he does not remember them, linear, not appearing internally preoccupied, not responding to internal stimuli, alert and oriented x 3. Patient denies suicidal ideation, intent, plan. Reports he wants to get home to his wife. Per patient's wife, patient was very  intoxicated last night when they argued and he cut himself. Denies that he made suicidal statements. States he frequently doesn't remember when what happens when he is intoxicated and is then remorseful. Patient at chronically elevated risk for harm to self, his acute risk is not elevated above his baseline risk. He has numerous protective factors including future oriented, identifies reasons to live, social support, therapeutic alliance with therapist. He does not meet criteria for inpatient psychiatric hospitalization. Recommendations as above.   Thank you for involving us  in the care of this patient. If you have any additional questions or concerns, please call [phone] and ask for me or the provider on-call.  TELEPSYCHIATRY ATTESTATION & CONSENT  As the provider for this telehealth consult, I attest that I verified the patient's identity using two separate identifiers, introduced myself to the patient, provided my credentials, disclosed my location, and performed this encounter via a HIPAA-compliant, real-time, face-to-face, two-way, interactive audio and video platform and with the full consent and agreement of the patient (or guardian as applicable.)  Patient physical location: ED in [facility]  Telehealth provider physical location: home office in state of [place]    Video start time: 915 AM EST Video end time: 929 AM EST   IDENTIFYING DATA  Miguel [name] is a 63 y.o. year-old male for whom a psychiatric consultation has been ordered by the primary provider. The patient was identified using two separate identifiers.  CHIEF COMPLAINT/REASON FOR CONSULT  Alcohol intoxication and self harm    HISTORY OF PRESENT ILLNESS (HPI)  Miguel [name] is a 63 year old male with a history of alcohol use disorder, substance induced mood disorder, depression, GAD who presents to the ED on IVC after reportedly getting into an argument with his wife and reportedly cutting his chest  with a knife and  making a 1cm superficial laceration. On ED  arrival patient's blood alcohol was 313 and he was reportedly combative, appeared intoxicated and require IM sedation. Chart reviewed. Psychiatry consulted for evaluation and management.   On evaluation, patient noted to be cooperative, tearful, remorseful about events leading to ED presentation though he does not remember them, linear, not appearing internally preoccupied, not responding to internal stimuli, alert and oriented x 3. Patient states he doesn't recall what happened that he is in the ED. He states, I only had one bottle of wine. He states, I need to be with my wife. She has MS. When explain to patient what reportedly happened, he states that it sounds possible that he got into a fight with his wife and cut himself. States he doesn't recall. Patient states, I [name]'t drink hard alcohol anymore. I only drink wine and its less toxic. Patient reports he drinks a bottle of wine every few days. Patient denies recollection that he cut himself and denies thoughts of suicide or suicidal intent. He denies passive and active suicidal ideation, intent, plan. Denies access to firearms. Denies a history of suicide attempts. He states, I wouldn't have done this if I wasn't drinking. I know that for sure. Patient expresses distress that he did something to try to hurt himself and does not recall that he did this or why. He states he feels badly that this happened. He states his wife told him, You're so much easier to get along with when you Miguel [name] drink. Patient reports he feels a little bit depressed. States he retired early to be at home with his wife to help her. 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 Denies other depressive symptoms. Denies symptoms consistent with mania/hypomania, paranoia, auditory and visual hallucinations, homicidal ideation. Patient reports he has a veterinary surgeon he sees twice a month and reports this has been helpful. Patient alert and oriented to  person, location, date. Patient knows the current President is Trump and the prior President was [place]. Patient states tearfully, I Raeden't want to hurt myself. I want to go home and take care of my wife. Reports they have been married 42 years. Patient reports he will reach out to his social worker and see if he can get a sooner appointment. Patient states, I just want to be at home making my wife breakfast.   Spoke to patient's wife [phone]). She reports patient was intoxicated and they got into an argument. She states he went into the kitchen and heard him shuffling around. She states she went into the kitchen and saw blood on his shirt. She states she didn't know how bad or deep it was, but she called 911. She states patient drank a bottle of wine and she is not surprised he doesn't recall what happened last night, states he was drinking all day. She states sometimes he drinks so much he doesn't remember what he says or does. She states he gets embarrassed and doesn't remember what he does. It's hard to talk to him when he's like that. She denies that he made any suicidal statements last night. She states, When he's sober he's a whole different person. He's very good to me. She reports she does not have safety concerns were patient to return home.     PAST PSYCHIATRIC HISTORY  Multiple prior ED visits for non-suicidal self injury in the setting of alcohol intoxication Prior psychiatric hospitalizations: Once in 2018 after cutting his arm; and once in 11/2023 after non-suicidal self injury in the setting of intoxication and argument with his  wife  Outpatient mental health treatment: Has a counselor he sees twice a month, sees again 11/20 Prior psychiatric medication trials: hydroxyzine , lorazepam , sertraline , trazodone , fluoxetine , risperidone , librium , naltrexone , citalopram , gabapentin , clonidine , quetiapine  Non-suicidal self injury: Yes, numerous ED presentations  Suicide  attempts: Trauma/abuse/neglect/exploitation: Yes     History of trauma/abuse/neglect/exploitation:  seen mom attempt suicide with cutting wrist; he went to neighbor to help C-SSRS 1) In the past month have you wished you were dead or wished you could go to sleep and not wake up? []  Yes [x]   No 2) In the past month have you actually had any thoughts of killing yourself? []   Yes  [x]   No If YES to 2, ask questions 3, 4, 5, and 6. If NO to 2, go directly to question 6 3) In the past month have you been thinking about how you might do this? []   Yes []   No 4) In the past month have you had these thoughts and had some intention of acting on them?  []   Yes []   No 5) In the past month have you started to work out or worked out the details of how to kill yourself? Do you intend to carry out this plan? []   Yes []   No 6) Have you ever done anything, started to do anything, or prepared to do anything to end your life? []   Yes [x]   No Otherwise as per HPI above.  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   folic acid  (FOLVITE ) 1 MG tablet Take 1 tablet  (1 mg total) by mouth daily.   diclofenac  Sodium (VOLTAREN ) 1 % GEL Apply 4 g topically 3 (three) times daily as needed (left chest wall pain).   albuterol  (VENTOLIN  HFA) 108 (90 Base) MCG/ACT inhaler Inhale 2 puffs into the lungs every 4 (four) hours as needed for wheezing or shortness of breath.   acetaminophen  (TYLENOL ) 500 MG tablet Take 2 tablets (1,000 mg total) by mouth every 8 (eight) hours as needed.   ibuprofen  (MOTRIN  IB) 200 MG tablet Take 3 tablets (600 mg total) by mouth every 8 (eight) hours as needed.   traZODone  (DESYREL ) 100 MG tablet Take 1 tablet (100 mg total) by mouth at bedtime as needed.   fluticasone -salmeterol (ADVAIR ) 100-50 MCG/ACT AEPB Inhale 1 puff into the lungs 2 (two) times daily.   sertraline  (ZOLOFT ) 50 MG tablet Take 1 tablet (50 mg total) by mouth daily. (  Patient not taking: Reported on 05/10/2024)   metoprolol  succinate (TOPROL  XL) 25 MG 24 hr tablet Take 1 tablet (25 mg total) by mouth daily. (Patient not taking: Reported on 09/29/2024)   colchicine  0.6 MG tablet Take 1 tablet (0.6 mg total) by mouth 2 (two) times daily. (Patient not taking: Reported on 05/10/2024)   ferrous sulfate  325 (65 FE) MG tablet Take 1 tablet (325 mg total) by mouth daily with breakfast. (Patient not taking: Reported on 09/29/2024)   Multiple Vitamin (MULTIVITAMIN WITH MINERALS) TABS tablet Take 1 tablet by mouth daily. (Patient not taking: Reported on 09/29/2024)   thiamine  (VITAMIN B-1) 100 MG tablet Take 1 tablet (100 mg total) by mouth daily. (Patient not taking: Reported on 09/29/2024)   sertraline  (ZOLOFT ) 100 MG tablet Take 1 tablet (100 mg total) by mouth daily. (Patient not taking: Reported on 05/10/2024)   naltrexone  (DEPADE) 50 MG tablet Take 1 tablet (50 mg total) by mouth daily. (Patient not taking: Reported on 05/10/2024)   lidocaine  (XYLOCAINE ) 5 % ointment Apply 1 Application topically as needed. Apply to left chest (Patient not taking: Reported on 05/10/2024)     ALLERGIES   No Known Allergies  SOCIAL & SUBSTANCE USE HISTORY  Social History   Socioeconomic History   Marital status: Married    Spouse name: Not on file   Number of children: Not on file   Years of education: Not on file   Highest education level: Not on file  Occupational History   Not on file  Tobacco Use   Smoking status: Former    Current packs/day: 0.00    Types: Cigarettes    Quit date: 03/19/1981    Years since quitting: 43.5   Smokeless tobacco: Never  Vaping Use   Vaping status: Never Used  Substance and Sexual Activity   Alcohol use: Yes    Alcohol/week: 24.0 standard drinks of alcohol    Types: 24 Shots of liquor per week    Comment: lasst used 8 days ago   Drug use: Not Currently    Types: Marijuana    Comment: last use 09/2021   Sexual activity: Not Currently  Other Topics Concern   Not on file  Social History Narrative   Lives at home with his wife and takes care of his wife. Independent at baseline      - Biggest strain is financial but doesn't think they could got get more help.      Patient expressed interest in possible financial assistance. Informed written consent obtained   Social Drivers of Health   Financial Resource Strain: High Risk (09/18/2020)   Overall Financial Resource Strain (CARDIA)    Difficulty of Paying Living Expenses: Very hard  Food Insecurity: Food Insecurity Present (05/10/2024)   Hunger Vital Sign    Worried About Running Out of Food in the Last Year: Sometimes true    Ran Out of Food in the Last Year: Sometimes true  Transportation Needs: Unmet Transportation Needs (05/10/2024)   PRAPARE - Administrator, Civil Service (Medical): Yes    Lack of Transportation (Non-Medical): No  Physical Activity: Sufficiently Active (09/18/2020)   Exercise Vital Sign    Days of Exercise per Week: 7 days    Minutes of Exercise per Session: 60 min  Stress: Stress Concern Present (09/18/2020)   [employer] of Occupational Health -  Occupational Stress Questionnaire    Feeling of Stress : Very much  Social Connections: Socially Isolated (09/18/2020)   Social  Connection and Isolation Panel    Frequency of Communication with Friends and Family: Once a week    Frequency of Social Gatherings with Friends and Family: Once a week    Attends Religious Services: Never    Database Administrator or Organizations: No    Attends Engineer, Structural: Never    Marital Status: Married   Social History   Tobacco Use  Smoking Status Former   Current packs/day: 0.00   Types: Cigarettes   Quit date: 03/19/1981   Years since quitting: 43.5  Smokeless Tobacco Never   Social History   Substance and Sexual Activity  Alcohol Use Yes   Alcohol/week: 24.0 standard drinks of alcohol   Types: 24 Shots of liquor per week   Comment: lasst used 8 days ago   Social History   Substance and Sexual Activity  Drug Use Not Currently   Types: Marijuana   Comment: last use 09/2021    Additional pertinent information  .  FAMILY HISTORY  Family History  Problem Relation Age of Onset   Alcohol abuse Father    Breast cancer Mother 90   Anxiety disorder Brother    Other Maternal Grandmother        unknown medical history   Other Maternal Grandfather        unknow medical history   Other Paternal Grandmother        unknown medical history   Other Paternal Grandfather        unknown medical history   Healthy Brother      MENTAL STATUS EXAM (MSE)  Mental Status Exam: General Appearance: Fairly Groomed  Orientation:  Full (Time, Place, and Person)  Memory:  Immediate;   Good; Recent poor; Remote good   Concentration:  Concentration: Good  Recall:  Fair  Attention  Good  Eye Contact:  Good  Speech:  Clear and Coherent  Language:  Good  Volume:  Normal  Mood: Okay  Affect:  Congruent  Thought Process:  Coherent  Thought Content:  Abstract Reasoning and Computation  Suicidal Thoughts:  No  Homicidal Thoughts:  No   Judgement:  Fair  Insight:  Fair  Psychomotor Activity:  Normal  Akathisia:  NA  Fund of Knowledge:  Good    Assets:  Manufacturing Systems Engineer Social Support  Cognition:  WNL  ADL's:  Intact  AIMS (if indicated):       VITALS  Blood pressure 126/63, pulse 88, temperature 97.6 F (36.4 C), temperature source Oral, resp. rate 18, height 6' (1.829 m), SpO2 100%.  LABS  Admission on 09/28/2024  Component Date Value Ref Range Status   WBC 09/28/2024 9.0  4.0 - 10.5 K/uL Final   RBC 09/28/2024 5.42  4.22 - 5.81 MIL/uL Final   Hemoglobin 09/28/2024 15.3  13.0 - 17.0 g/dL Final   HCT 88/86/7974 46.9  39.0 - 52.0 % Final   MCV 09/28/2024 86.5  80.0 - 100.0 fL Final   MCH 09/28/2024 28.2  26.0 - 34.0 pg Final   MCHC 09/28/2024 32.6  30.0 - 36.0 g/dL Final   RDW 88/86/7974 18.8 (H)  11.5 - 15.5 % Final   Platelets 09/28/2024 199  150 - 400 K/uL Final   nRBC 09/28/2024 0.0  0.0 - 0.2 % Final   Performed at [facility], [address] [place], [place] [address]   Sodium 09/28/2024 139  135 - 145 mmol/L Final   Potassium 09/28/2024 4.1  3.5 - 5.1 mmol/L Final  Chloride 09/28/2024 100  98 - 111 mmol/L Final   CO2 09/28/2024 24  22 - 32 mmol/L Final   Glucose, Bld 09/28/2024 100 (H)  70 - 99 mg/dL Final   Glucose reference range applies only to samples taken after fasting for at least 8 hours.   BUN 09/28/2024 15  8 - 23 mg/dL Final   Creatinine, Ser 09/28/2024 1.17  0.61 - 1.24 mg/dL Final   Calcium  09/28/2024 9.7  8.9 - 10.3 mg/dL Final   Total Protein 88/86/7974 8.0  6.5 - 8.1 g/dL Final   Albumin  09/28/2024 4.8  3.5 - 5.0 g/dL Final   AST 88/86/7974 17  15 - 41 U/L Final   ALT 09/28/2024 16  0 - 44 U/L Final   Alkaline Phosphatase 09/28/2024 98  38 - 126 U/L Final   Total Bilirubin 09/28/2024 0.4  0.0 - 1.2 mg/dL Final   GFR, Estimated 09/28/2024 >60  >60 mL/min Final   Comment: ([name]) Calculated using the CKD-EPI Creatinine Equation (2021)    Anion gap 09/28/2024 15  5  - 15 Final   Performed at [facility], [address] [place], [place] [address]   Alcohol, Ethyl (B) 09/28/2024 313 (HH)  <15 mg/dL Final   Comment: Critical Value, Read Back and verified with MARTHA PRIDE AT 2245 09/28/24 JG ([name]) For medical purposes only. Performed at [facility], [address] [place], [place] [address]     PSYCHIATRIC REVIEW OF SYSTEMS (ROS)  ROS: Notable for the following relevant positive findings: See HPI  Additional findings:      Musculoskeletal: No abnormal movements observed      Gait & Station: Laying/Sitting      Pain Screening: Denies      Nutrition & Dental Concerns: n/a  RISK FORMULATION/ASSESSMENT  Is the patient experiencing any suicidal or homicidal ideations: No      Protective factors considered for safety management: Future oriented, identifies reasons to live, social support, therapeutic alliance with therapist   Risk factors/concerns considered for safety management:  Depression Substance abuse/dependence Impulsivity Male gender Age over 93  Is there a safety management plan with the patient and treatment team to minimize risk factors and promote protective factors: Yes           Explain: Follow up with outpatient counselor; resources for outpatient substance use treatment and psychiatry; crisis line information; ED return precautions  Is crisis care placement or psychiatric hospitalization recommended: No     Based on my current evaluation and risk assessment, patient is determined at this time to be at:  Moderate Risk  *RISK ASSESSMENT Risk assessment is a dynamic process; it is possible that this patient's condition, and risk level, may change. This should be re-evaluated and managed over time as appropriate. Please re-consult psychiatric consult services if additional assistance is needed in terms of risk assessment and management. If your team decides to discharge this patient, please advise the patient  how to best access emergency psychiatric services, or to call 911, if their condition worsens or they feel unsafe in any way.   Erla JAYSON Rase, MD Telepsychiatry Consult Services

## 2024-09-29 NOTE — ED Notes (Signed)
 Pt attempted to stand to walk to the bathroom. With 1 person assist, pt is still very ataxic and not stable on his feet. Pt instructed to lay back down in the bed and provided a urinal. Pt agreeable to this plan

## 2024-09-29 NOTE — TOC Initial Note (Signed)
 Transition of Care [facility]) - Initial/Assessment Note    Patient Details  Name: Miguel [name] MRN: [id number] Date of Birth: 1960/12/12  [facility] CM/SW Contact:    Stevens Magwood L Deaire Mcwhirter, LCSW Phone Number: 09/29/2024, 8:52 AM  Clinical Narrative:                    [facility] consult received for substance abuse education/counseling. TOC does not provide counseling or education. Resources have been added to the AVS. Patient is on psych hold.      Patient Goals and CMS Choice            Expected Discharge Plan and Services                                              Prior Living Arrangements/Services                       Activities of Daily Living      Permission Sought/Granted                  Emotional Assessment              Admission diagnosis:  ETOH Patient Active Problem List   Diagnosis Date Noted   Adjustment disorder with depressed mood 05/10/2024   Non-suicidal self-harm (HCC) 05/10/2024   Alcohol use disorder 05/10/2024   Alcohol use disorder, severe, dependence (HCC) 05/10/2024   Inadequate pain control 04/11/2024   Multiple rib fractures 04/11/2024   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   Alcohol abuse with intoxication 12/23/2023   Involved in legal proceedings 12/08/2023   Marital conflict 12/08/2023   Fall 10/21/2023   Iron  deficiency 05/28/2023   Alcohol withdrawal with inpatient treatment (HCC) 05/24/2023   Total bilirubin, elevated 05/24/2023   Traumatic ecchymosis 05/24/2023   Hypokalemia 04/29/2023   Hypocalcemia 04/29/2023   Essential hypertension 04/29/2023   Depression 04/29/2023   Syncope 04/28/2023   Encounter for screening for HIV 04/16/2023   Encounter for hepatitis C screening test for low risk patient 04/16/2023   Establishing care with new doctor, encounter for 04/16/2023   Hyperthyroidism 04/16/2023   Elevated serum glucose 04/16/2023   Mixed  hyperlipidemia 04/16/2023   Centrilobular emphysema (HCC) 04/16/2023   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) 04/16/2023   Orthostatic hypotension 11/28/2020   Genetic testing 07/15/2020   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    Hx of colonic polyps    Generalized anxiety disorder 10/14/2019   Alcoholic cirrhosis of liver without ascites (HCC) 06/14/2019   Alcohol abuse 06/14/2019   AKI (acute kidney injury) 05/27/2019   Alcoholic intoxication without complication    Nonsuicidal self-injury (HCC) 03/10/2017   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   PCP:  Gasper Nancyann BRAVO, MD Pharmacy:   [facility] [address], [place] - [address] [place] [address] Phone: [phone] Fax: [phone]     Social Drivers of Health (SDOH) Social History: SDOH Screenings   Food Insecurity: Food Insecurity Present (05/10/2024)  Housing: Low Risk  (04/11/2024)  Transportation Needs: Unmet Transportation Needs (05/10/2024)  Utilities: Not At Risk (05/10/2024)  Alcohol Screen: Medium Risk (03/19/2021)  Depression (PHQ2-9): Medium Risk (05/11/2024)  Financial Resource Strain: High Risk (09/18/2020)  Physical Activity: Sufficiently Active (09/18/2020)  Social Connections: Socially Isolated (09/18/2020)  Stress: Stress Concern Present (09/18/2020)  Tobacco Use: Medium Risk (09/28/2024)   SDOH Interventions:     Readmission Risk Interventions    05/25/2023    4:31 PM 01/19/2023   12:40 PM  Readmission Risk Prevention Plan  Transportation Screening Complete Complete  Medication Review (RN Care Manager) Referral to Pharmacy Complete  PCP or Specialist appointment within 3-5 days of discharge Complete Complete  HRI or Home Care Consult Complete    SW Recovery Care/Counseling Consult Complete Complete  Palliative Care Screening Not Applicable Not Applicable  Skilled Nursing Facility Not Applicable Not Applicable

## 2024-09-29 NOTE — Discharge Instructions (Addendum)
 Return to ER for worsening symptoms or any other concern  Outpatient Substance Use Treatment Services   [facility] [address] [place], [place] [address]  [phone] Private insurance, Medicare A&B, and [facility]   [facility]  [address]  [place], [place] [address] [phone] Medicaid, Self Pay   [facility]      [address]  [place], [place] [phone] Medicaid and [facility], Self Pay   [facility] [address]  [place], [place]  [phone] [facility], and Self Pay  [facility]      [address]    [place], [place] [address]  [phone] or [phone] Private Insurance Only                 [facility] [address]  [place], [place]  [phone] Medicaid, Medicare, Private Insurance  [facility] [address]  [place], [place] [address] [phone] Services are free or reduced  [facility]  [address] [phone]  Self Pay only, sliding scale  [facility]  [address]  [place], [place] [address] [phone] ([facility]) Self Pay, Medicaid Only   [facility] [address] [place], [place] [address] [phone] Medicaid, Medicare, Private Insurance

## 2024-09-29 NOTE — ED Notes (Addendum)
 Pt assisted to the restroom by security. This RN heard a thud and came to the restroom. Pt was found to be on the floor after a presumed fall. Unable to answer questions about what happened but remains at same mental baseline from prior to the fall. No obvious signs of injury. MD [employer] made aware

## 2024-09-29 NOTE — ED Notes (Signed)
 Patient transported to and from CT.

## 2024-09-29 NOTE — ED Notes (Signed)
 Hospital meal provided, pt tolerated w/o complaints.  Waste discarded appropriately.

## 2024-09-29 NOTE — ED Notes (Signed)
IVC/Psych Consult Ordered/Pending

## 2024-09-29 NOTE — ED Notes (Signed)
 IVC PAPERS  RESCINDED PER DR  ERNEST MD PT NOW  VOL

## 2024-09-29 NOTE — ED Notes (Signed)
 [employer] called for transport home

## 2024-09-29 NOTE — ED Provider Notes (Signed)
 Emergency Medicine Observation Re-evaluation Note   BP 126/63 (BP Location: Left Arm)   Pulse 88   Temp 97.6 F (36.4 C) (Oral)   Resp 18   Ht 6' (1.829 m)   SpO2 100%   BMI 24.52 kg/m    ED Course / MDM   Signed out to me under IVC pending chest x-ray which shows no pneumothorax.  Patient resting comfortably.  Got up to use the restroom to urinate and fell hitting the back of his head.  No external signs of trauma, got a CT of the head which was negative.  Medically cleared for psychiatric evaluation under IVC.  Pt is awaiting dispo from consultants   Ginnie Shams MD    Shams Ginnie, MD 09/29/24 0040

## 2024-09-29 NOTE — ED Provider Notes (Addendum)
 Per psychHe does not meet criteria for inpatient psychiatric hospitalization. He has no recollection of what happened last night due to being intoxicated, is very remorseful, denies suicidal ideation. Spoke to his wife who confirms patient was intoxicated, denies he made suicidal statements. She feels comfortable having him return home. Recommend resources for outpatient substance use and psychiatry. Follow up with his outpatient counselor and primary care. ED return precautions, thank you  Discussed with nurse and patient appears sober..  I reviewed his blood work and his CBC was reassuring CMP was reassuring ethanol level was 313 however patient has been here for over 13 hours.  He has been ambulatory without any assistance.  I reevaluated patient myself he denies any SI he denies any pain and he states that he feels comfortable taking a taxi home as his wife is not able to transport him.  Per psychiatrist they do not rescind IVC and asked me to do it, I did review the psych note who was okay with discharge.    Ernest Ronal BRAVO, MD 09/29/24 1004    Ernest Ronal BRAVO, MD 09/29/24 1028

## 2024-11-10 ENCOUNTER — Other Ambulatory Visit: Payer: Self-pay | Admitting: Physician Assistant

## 2024-11-10 DIAGNOSIS — G4709 Other insomnia: Secondary | ICD-10-CM

## 2024-11-10 NOTE — Telephone Encounter (Signed)
 Patient is past due for an appointment.

## 2024-12-16 ENCOUNTER — Other Ambulatory Visit: Payer: Self-pay | Admitting: Physician Assistant

## 2024-12-16 DIAGNOSIS — G4709 Other insomnia: Secondary | ICD-10-CM
